# Patient Record
Sex: Male | Born: 1935 | Race: White | Hispanic: No | Marital: Married | Smoking: Former smoker
Health system: Southern US, Community
[De-identification: ages and names within clinical notes are randomized; demographics above are authoritative.]

## PROBLEM LIST (undated history)

## (undated) DIAGNOSIS — I4891 Unspecified atrial fibrillation: Secondary | ICD-10-CM

## (undated) DIAGNOSIS — N189 Chronic kidney disease, unspecified: Secondary | ICD-10-CM

## (undated) DIAGNOSIS — G4730 Sleep apnea, unspecified: Secondary | ICD-10-CM

## (undated) DIAGNOSIS — E785 Hyperlipidemia, unspecified: Secondary | ICD-10-CM

## (undated) DIAGNOSIS — I509 Heart failure, unspecified: Secondary | ICD-10-CM

## (undated) DIAGNOSIS — I340 Nonrheumatic mitral (valve) insufficiency: Secondary | ICD-10-CM

## (undated) DIAGNOSIS — C61 Malignant neoplasm of prostate: Secondary | ICD-10-CM

## (undated) DIAGNOSIS — N393 Stress incontinence (female) (male): Secondary | ICD-10-CM

## (undated) DIAGNOSIS — I10 Essential (primary) hypertension: Secondary | ICD-10-CM

## (undated) DIAGNOSIS — R350 Frequency of micturition: Secondary | ICD-10-CM

## (undated) DIAGNOSIS — N529 Male erectile dysfunction, unspecified: Secondary | ICD-10-CM

## (undated) DIAGNOSIS — M1990 Unspecified osteoarthritis, unspecified site: Secondary | ICD-10-CM

## (undated) DIAGNOSIS — I739 Peripheral vascular disease, unspecified: Secondary | ICD-10-CM

## (undated) DIAGNOSIS — E119 Type 2 diabetes mellitus without complications: Secondary | ICD-10-CM

## (undated) DIAGNOSIS — J189 Pneumonia, unspecified organism: Secondary | ICD-10-CM

## (undated) DIAGNOSIS — L97529 Non-pressure chronic ulcer of other part of left foot with unspecified severity: Secondary | ICD-10-CM

## (undated) DIAGNOSIS — E11621 Type 2 diabetes mellitus with foot ulcer: Secondary | ICD-10-CM

## (undated) HISTORY — PX: PROSTATE SURGERY: SHX751

## (undated) HISTORY — DX: Frequency of micturition: R35.0

## (undated) HISTORY — PX: EYE SURGERY: SHX253

## (undated) HISTORY — PX: CHOLECYSTECTOMY: SHX55

## (undated) HISTORY — DX: Stress incontinence (female) (male): N39.3

## (undated) HISTORY — DX: Male erectile dysfunction, unspecified: N52.9

## (undated) HISTORY — PX: TONSILLECTOMY: SUR1361

## (undated) HISTORY — DX: Malignant neoplasm of prostate: C61

---

## 2004-07-07 ENCOUNTER — Ambulatory Visit: Payer: Self-pay | Admitting: Internal Medicine

## 2006-01-20 ENCOUNTER — Ambulatory Visit: Payer: Self-pay | Admitting: Specialist

## 2006-07-18 ENCOUNTER — Ambulatory Visit: Payer: Self-pay | Admitting: Unknown Physician Specialty

## 2006-07-19 ENCOUNTER — Ambulatory Visit: Payer: Self-pay | Admitting: Cardiology

## 2006-07-28 ENCOUNTER — Ambulatory Visit: Payer: Self-pay

## 2006-09-07 ENCOUNTER — Ambulatory Visit: Payer: Self-pay | Admitting: Cardiology

## 2006-09-07 ENCOUNTER — Encounter (INDEPENDENT_AMBULATORY_CARE_PROVIDER_SITE_OTHER): Payer: Self-pay | Admitting: *Deleted

## 2006-09-07 ENCOUNTER — Inpatient Hospital Stay (HOSPITAL_COMMUNITY): Admission: RE | Admit: 2006-09-07 | Discharge: 2006-09-23 | Payer: Self-pay | Admitting: Urology

## 2006-09-15 ENCOUNTER — Encounter: Payer: Self-pay | Admitting: Cardiology

## 2006-09-15 ENCOUNTER — Ambulatory Visit: Payer: Self-pay | Admitting: Cardiology

## 2006-10-08 ENCOUNTER — Emergency Department (HOSPITAL_COMMUNITY): Admission: EM | Admit: 2006-10-08 | Discharge: 2006-10-08 | Payer: Self-pay | Admitting: Emergency Medicine

## 2008-12-24 ENCOUNTER — Ambulatory Visit: Payer: Self-pay | Admitting: Internal Medicine

## 2009-08-25 ENCOUNTER — Ambulatory Visit: Payer: Self-pay | Admitting: Family Medicine

## 2009-09-23 ENCOUNTER — Ambulatory Visit: Payer: Self-pay | Admitting: Family Medicine

## 2009-10-03 ENCOUNTER — Encounter: Admission: RE | Admit: 2009-10-03 | Discharge: 2009-10-03 | Payer: Self-pay | Admitting: Neurosurgery

## 2010-02-20 ENCOUNTER — Ambulatory Visit: Payer: Self-pay | Admitting: Ophthalmology

## 2010-03-04 ENCOUNTER — Ambulatory Visit: Payer: Self-pay | Admitting: Ophthalmology

## 2010-10-09 NOTE — Assessment & Plan Note (Signed)
[facility]                            CARDIOLOGY OFFICE NOTE   NAME:STIELPERHonorio, Devol                       MRN:          [id number]  DATE:07/19/2006                            DOB:          1935-01-11    Mr. Furtick is a very pleasant 76 year old male with a past medical  history of diabetes mellitus, hypertension, and hyperlipidemia who we  were asked to evaluate preoperatively prior to prostatectomy.  He has no  prior cardiac history.  He typically does not have dyspnea on exertion,  orthopnea, PND, pedal edema, palpitations, presyncope, syncope, or  exertional chest pain.  He has been found to have prostate cancer.  A  prostatectomy is potentially scheduled.  We were asked to evaluate prior  to the procedure.   His medications include:  1. Toprol 100 mg p.o. daily.  2. Metformin 1000 mg p.o. b.i.d.  3. Diovan 325 mg p.o. daily.  4. Lipitor 80 mg p.o. daily.  5. Actos 30 mg p.o. daily.  6. Januvia 100 mg p.o. daily.  7. Lotrel 10/40 daily.  8. Aspirin 81 mg p.o. daily.  9. CPAP.   HE HAS NO KNOWN DRUG ALLERGIES.   HE IS ALLERGIC TO LATEX.   SOCIAL HISTORY:  He has remote history of tobacco use, but does not  smoke at present.  He does not consume alcohol.   FAMILY HISTORY:  Negative for coronary artery disease.   PAST MEDICAL HISTORY:  Significant for:  1. Diabetes mellitus.  2. Hypertension.  3. Hyperlipidemia.  4. He also has obstructive sleep apnea, and uses CPAP.   He has been diagnosed with prostate cancer as outlined in the HPI.  He  has had a prior cholecystectomy as well as appendectomy.   REVIEW OF SYSTEMS:  He denies any headaches, fevers or chills.  There is  no productive cough or hemoptysis.  There is no dysphagia, odynophagia,  melena, or hematochezia.  There is no dysuria or hematuria.  There is no  rash or seizure activity.  There is no orthopnea, PND, or pedal edema.  There is no claudication noted.  He does have  mild peripheral  neuropathy.  The remainder of the systems are negative.   PHYSICAL EXAMINATION:  Shows a blood pressure of 180/80, and his pulse  is 60.  He weighs 229 pounds.  He is well developed and obese.  He is in acute distress.  SKIN:  Warm and dry.  He does appear to be depressed.  There is no peripheral clubbing.  BACK:  Normal.  HEENT:  Unremarkable with normal eyelids.  NECK:  Supple with normal upstroke bilaterally.  I could not appreciate  bruits.  There is no jugular venous distention.  No thyromegaly is  noted.  CHEST:  Clear to auscultation.  Normal expansion.  CARDIOVASCULAR:  Exam reveals a regular rate and rhythm.  Normal S1 and  S2.  There are no murmurs, rubs or gallops noted.  His PMI is  nondisplaced.  ABDOMINAL EXAM:  Not tender or distended.  Positive bowel sounds.  No  hepatosplenomegaly.  No masses appreciated.  There is no abdominal  bruit.  He has 2+ femoral pulses bilaterally.  No bruits.  EXTREMITIES:  Show no edema.  I could palpate no cords.  He has 1+  dorsalis pedis pulses bilaterally.  NEUROLOGIC EXAM:  Grossly intact.  His electrocardiogram shows a sinus rhythm at a rate of 60.  There are  nonspecific ST changes.   DIAGNOSES:  1. Preoperative evaluation prior to prostatectomy.  2. Prostate cancer.  3. Hypertension.  4. Hyperlipidemia.  5. Diabetes mellitus.   PLAN:  Mr. Santilli presents for preoperative evaluation prior to  prostatectomy.  He has multiple risk factors including approximately 8  to 10 years of diabetes mellitus, hypertension, hyperlipidemia, and  remote tobacco use.  We will schedule him to have an adenosine Myoview.  If this shows normal perfusion, then I think he can proceed safely with  his surgery.  I will see him back on an as-needed basis pending those  results.  We did discuss risk factor modification today, and he is  implementing an exercise program, which is another reason to perform his  Myoview.  His blood  pressure is mildly elevated today, but I have asked  him to track this, and he will follow up with his primary care physician  for this issue.  If it remains elevated, then a low-dose diuretic such  as hydrochlorothiazide would be helpful.     Madolyn Frieze Jens Som, MD, [facility]  Electronically Signed   BSC/MedQ  DD: 07/19/2006  DT: 07/19/2006  Job #: [id number]   cc:   Windy Fast L. Earlene Plater, M.D.

## 2010-10-09 NOTE — H&P (Signed)
[name], [name]              ACCOUNT NO.:  [id number]   MEDICAL RECORD NO.:  [id number]          PATIENT TYPE:  INP   LOCATION:  1434                         FACILITY:  [facility]   PHYSICIAN:  Ronald L. Earlene Plater, M.D.  DATE OF BIRTH:  [birth year]   DATE OF ADMISSION:  09/07/2006  DATE OF DISCHARGE:  09/23/2006                              HISTORY & PHYSICAL   CHIEF COMPLAINT:  I have prostate cancer.   HISTORY OF PRESENT ILLNESS:  [name] is a 76 year old white male  found have an elevated PSA of 17.8.  He underwent biopsy of the prostate  in [place], [place], was found have a Gleason score 6  adenocarcinoma in 5% of the biopsy of the left apex.  After  understanding risks, benefits and alternatives, he would like to proceed  with a robotic prostatectomy.   PAST MEDICAL HISTORY:  He is allergic to CONTRAST MEDIA; he had  anaphylaxis.   PAST MEDICAL HISTORY:  He has hypertension, hyperlipidemia, sleep apnea,  and type 2 diabetes.   PAST SURGERIES:  History of cholecystectomy and appendectomy.   FAMILY HISTORY:  Nonsignificant.   SOCIAL HISTORY:  He does not smoke or drink.   REVIEW OF SYSTEMS:  He has no shortness of breath, dyspnea on exertion,  chest pain or GI complaints.   PHYSICAL EXAMINATION:  VITAL SIGNS:  He is afebrile.  Vital signs  stable.  GENERAL:  Well-nourished, well-developed, well-groomed, oriented x3.  HEENT:  Normal.  NECK:  Without masses or thyromegaly.  CHEST:  Clear anterior and posterior without rales or rhonchi.  HEART:  Normal sinus rhythm without murmurs or gallops.  ABDOMEN:  Soft, nontender, without masses or organomegaly.  EXTREMITY:  Normal.  NEUROLOGIC:  Intact.  SKIN:  Normal.  GENITOURINARY:  Penis, meatus, scrotum, testicle, adnexa, anus, perineum  are normal.  Rectal vault is empty.  Prostate was 25-30 g, smooth and  dormant.   IMPRESSION:  Clinical stage T1c adenocarcinoma of the prostate.   PLAN:  Radical retropubic  prostatectomy.      Ronald L. Earlene Plater, M.D.  Electronically Signed     RLD/MEDQ  D:  11/15/2006  T:  11/16/2006  Job:  [id number]

## 2010-10-09 NOTE — Discharge Summary (Signed)
NAMERAMESES, OU              ACCOUNT NO.:  [id number]   MEDICAL RECORD NO.:  [id number]          PATIENT TYPE:  INP   LOCATION:  1434                         FACILITY:  [facility]   PHYSICIAN:  Ronald L. Earlene Plater, M.D.  DATE OF BIRTH:  Oct 26, 1934   DATE OF ADMISSION:  09/07/2006  DATE OF DISCHARGE:  09/23/2006                               DISCHARGE SUMMARY   ADMITTING DIAGNOSES:  1. Prostate cancer, planning for a robotic-assisted laparoscopic      prostatectomy.  2. Type 2 diabetes.  3. Sleep apnea.  4. Hyperlipidemia.   DISCHARGE DIAGNOSES:  1. Failed a Robotics prostatectomy; conversion to open radical      retropubic prostatectomy and placement of bilateral double-J      stents.  2. Acute tubular necrosis to ileus versus small bowel obstruction.  3. Diabetes.  4. REACTION TO CONTRAST MEDIUM after a computerized tomography scan.  5. Ventricular tachycardia.   BRIEF HISTORY:  [name] is a 76 year old Caucasian male found to  have an elevated PSA of 17.8.  He underwent biopsy of the prostate by  Dr. Rosezetta Schlatter in [place], [place], and was found to have  Gleason Score 6 adenocarcinoma in 5% of the biopsy from the left apex.  After understanding risks, benefits, and alternatives, the patient  elected to proceed with robotic medical prostatectomy.  He was also  worked up preoperatively for cardiac issues with his cardiac history and  was cleared.   PAST MEDICAL HISTORY:  1. Hypertension.  2. Hyperlipidemia.  3. Normal stress test on July 28, 2006.  4. Sleep apnea.  5. Type 2 diabetes.   PAST SURGICAL HISTORY:  1. Cholecystectomy 11 years ago.  2. Appendectomy 9 years ago.   ALLERGIES:  CONTRAST MEDIUM - ANAPHYLACTIC SHOCK.   HOSPITAL COURSE:  On April the 16th, patient was electively admitted to  [facility] under the care of Dr. Darvin Neighbours, again with the  following surgical procedure:  Attempted robotic radical prostatectomy  with conversion  to open radical prostatectomy and placement of double-J  stent.  Estimated blood loss during the procedure was 800 ml.  He was  extubated immediately after surgery.  Drains included a #20-French  Silicon Foley, and a large, round, Blake drain.   On postop day one, hypertensive throughout the evening despite IV  fluids, urinary output decreased, Blake output decreased, hemoglobin  9.8, hematocrit 28.1, WBCs 11.5.  Sodium 139, potassium 4.7, chloride  104, CO2 27, BUN 29, creatinine 2.15, glucose 144.  At this point,  Hospitalist was consulted.  Patient remained in the ICU.  Hospitalist  ordered transfusion of two units of RBCs.  Acute tubular necrosis was  suspected due to the hypertension.  Will monitor creatinine and  electrolytes.  Anemia secondary to acute blood loss subsequently  resolved with transfusion.  Due to his worsening renal function,  Metformin and Mingo Amber will be held and supplement with Lantus and  sliding scale, but Lipitor will be continued.  Postop day two, urine  output increased to 1000 ml, vital signs have remained stable, unable to  tolerate diet, decreased  back to liquid, Blake output continues to  decrease.  Labs:  Hemoglobin 9.9, hematocrit 30.1, sodium 129, potassium  3.7, chloride 99, CO2 23, glucose 135, BUN 40, creatinine 2.66.  Ambulation was encouraged, and IV fluids changed to normal saline.  [facility] continues to follow.  Renal function continues to  improve, hemoglobin continues to rise.  On September 10, 2006, Foley  catheter came out due to spontaneous rupture of balloon.  Dr. Brunilda Payor, on  call, and performed a flexible cysto and under direct visual of the  urethra was able to pass a flexible wire through the bladder neck.  A  #22-French council tip was then passed over the guidewire, and the  balloon was inflated with 20 ml of water.   The patient continues to tolerate liquids well, Blake drain continues  with minimal output, hemoglobin up to  10.6, hematocrit 30.5, BUN 23,  creatinine 1.09 on September 11, 2006.  Abdomen is soft and tender; however,  it is becoming distended.  Abdominal x-rays ordered.  A low grade fever  at 100.1 with a chest x-ray showing COPD and mild cardiomegaly.  Abdominal x-rays show ileus versus early small bowel obstruction.  An NG  tube was placed on September 11, 2006, and patient was NPO.  April 21st, JP  continues to have minimal to moderate drainage, creatinine obtained on  fluid was 1.1.  Creatinine now down to 1.1, hemoglobin 10.7, WBC 6.4.  Remains NPO, and NG remains in place.  A PICC line is recommended, and a  discussion of TPN.  Vital signs at this point remain stable.  The  patient still continues to complain of back pain, which is more stent  related.  Labs on April 22nd:  Sodium 136, potassium 4.1, chloride 102,  CO2 30, BUN 30, creatinine 1.0, calcium 7.55, white count 8, hemoglobin  9.6, hematocrit 20.7, platelets 263.  During this time, diabetes is  becoming more difficult to control and Lantus was increased, a PICC line  is added, and TPN is started.  [facility] continues to manage.   On April 23rd, small runs of V-tach noted, cardiologist consulted, PVCs  with nonsustained tachycardia most likely secondary to hyper-adrenergic  state associated with prostatectomy, ileus, and pain.  IV Lopressor was  increased to 5 mg IV q.6, and enzymes will be obtained.  On postop day  eight, April 24th, a temp was up to a max of 100.2, he remains on clear  liquids, no BM, no flatus, he remains with NG tube.  White count is up  to 15.8, hemoglobin is 8.8.  Attempted to clamp NG tube.   CT scan obtained for increased fever, rule out an abscess, however,  after getting IV contrast, patient felt flushed and was unable to  breathe.  Rapid Response Team was called to the scene, with Benadryl, Pepcid, Epinephrine, and Solu-Medrol given.  On April 25th, the  patient's NG tube discontinued, clears were  started, he was transferred  to the floor.  Patient continued to advance his diet, with flatus and  BM.  Patient's temp max on April 26th was 103.1, and this was when CT  scan was performed requiring management of anaphylaxis.   Patient continued to progress, IV antibiotics were stopped, BMs  continued, white count down to 14.8 with hemoglobin stable at 8.1.  Continues to have discomfort in back; however, the patient is reassured  that this is because of the stents.   The patient continues to improve, final blood  cultures are negative,  vital signs are stable with an occasional sinus brady.  Cardiology and  Hospitalist know he is set for discharge.  Therefore on May 02nd, the  patient is discharged home, his PICC line is discharged, the staples had  been removed previously, and he will follow up with Cardiology and  primary care [name].   CONDITION ON DISCHARGE:  Improved.   DISCHARGE MEDICATIONS:  1. Levaquin 500 mg one p.o. daily.  2. Oxycodone 5/325 one to two p.o. q.4h as needed.  3. Detrol-LA 4 mg one p.o. daily while catheter in.  4. Iron 325 mg one p.o. b.i.d.  5. Toprol-XL 100 mg daily.  6. Metformin 1000 mg b.i.d.  7. Diovan 325 mg daily.  8. Lipitor 80 mg daily.  9. Actos 30 mg daily.  10.Jenuvia 100 mg daily.  11.Lotrel 10-40 mg daily.  12.Aspirin 81 mg daily.  13.Colace 100 mg twice daily.   ACTIVITY:  No lifting for six weeks, increase activity slowly.   DIET:  Heart-healthy and diabetic.   WOUND CARE:  Keep Steri-Strips clean, may shower.   FOLLOWUP:  Return to see Cammy Copa the following week and  follow up with a cardiologist.     ______________________________  Alessandra Bevels. Chase Picket, FNP-C    ______________________________  Lucrezia Starch Earlene Plater, M.D.    JML/MEDQ  D:  10/18/2006  T:  10/18/2006  Job:  [id number]

## 2010-10-09 NOTE — Op Note (Signed)
[name], [name]              ACCOUNT NO.:  [id number]   MEDICAL RECORD NO.:  [id number]          PATIENT TYPE:  INP   LOCATION:  0003                         [facility]ACILITY[facility]  [facility]   PHYSICIAN:  Ronald L. Earlene Plater, M.D.  DATE OF BIRTH:  11-28-1934   DATE OF PROCEDURE:  09/07/2006  DATE OF DISCHARGE:                               OPERATIVE REPORT   PREOPERATIVE DIAGNOSES:  Adenocarcinoma of the prostate.   POSTOPERATIVE DIAGNOSES:  Adenocarcinoma of the prostate.   OPERATIVE PROCEDURE:  Attempted robotic radical prostatectomy with open  radical and retropubic prostatectomy and placement of bilateral double-J  stents.   SURGEON:  Gaynelle Arabian, M.D.   ASSISTANT:  Valetta Fuller, M.D.   ANESTHESIA:  General endotracheal.   BLOOD LOSS:  800 ML.   TUBES:  20-French silicone Foley and large round Blake drain.   INDICATIONS:  This is a very nice 76 year old white male who was found  to have an elevated PSA of 7.8.  He underwent biopsy of the prostate by  Dr. Dewayne Hatch in [facility] and was found to have  Gleason score 6 adenocarcinoma in 5% of the biopsy from the left apex.  After understanding risks, benefits and alternatives, being  preoperatively worked up for cardiac issues, it was elected to proceed  with robotic radical prostatectomy.   DESCRIPTION OF PROCEDURE:  The patient was placed in the supine  position.  After proper general endotracheal anesthesia was placed in  the dorsal lithotomy position, prepped and draped with Betadine in a  sterile fashion.  He was placed in exaggerated lithotomy position.  A 24-  French silicone catheter was placed and the bladder was drained.  The  periumbilical incision was made, a small incision.  The peritoneum was  entered and a 12-mm cannula was placed.  Inspection of the abdomen  revealed multiple adhesions in the right lower quadrant and some in the  right upper quadrant from his prior surgery.  The left-handed and  third  arm robotic ports were then placed under direct vision in the usual  manner and utilizing scissors and a Teaching laboratory technician the adhesions were  taken down so as to free up the peritoneal on the right side.  Under  direct vision the right robotic port was placed in the usual position as  was a 5 mm working port and the 12 mm right working port.  The robot was  attached and dissection was begun.  The bladder was filled with  approximately 150 mL of sterile water and the bladder flap was taken  down.  Of note he had a very tiny pelvis, a large prostate, significant  adipose tissue and motion in the robot was quite difficult.  The bladder  flap was taken down.  The bladder was deflated.  Endopelvic fascia was  perforated bilaterally.  After he had been defatted and the  puboprostatic ligaments were carefully dissected as was a dorsal vein  complex.  Utilizing Endo GIA stapler dorsal vein staple was placed.  There was some oozing therefore secondary stitch of figure-of-eight  Vicryl 2-0 was placed.  Following this dissection was done at the  bladder neck.  The bladder neck was taken down posteriorly.  It was  extremely difficult.  He had quite a large prostate and was noted to  have a very large median bar.  This was taken down.  It was very close  to the ureteral orifices.  Indigo carmine was given IV and they were  visualized but when the dissection was carried down posterior to the  prostate, although good hemostasis, it was very deep, very limited  motion of the robotic arms and it was felt at this point that it was  dangerous and felt that the procedure should be opened at this point.  We were just not comfortable proceeding with the difficult motion of the  arms.  We were utilizing a 30 degree lens at that point.  The hardware  was subsequently removed.  A lower midline vertical abdominal incision  was made.  Sharp dissection was carried down.  The peritoneum was  opened.  The  prostate was identified.  The lateral pedicles were  identified and the apex was taken down cut, a plane was dissected on  Denonvilliers fascia.  Each pedicle was taken in packet and clipped with  Hem-o-lok clips.  The seminal vesicles and ampulla of vas deferens were  dissected free and removed.  Good hemostasis noted to  be present.  The  bladder neck was noted to be widely opened.  The ureteral orifices were  noted.  Therefore, bilateral 26 cm 6-French contour double pigtail  stents were placed and noted to be in good position within the right and  left renal pelves.  The bladder neck was then closed in a tennis racket  type fashion from the top to bottom with 2-0 Vicryl suture.  The  ureteral orifices and stents were ducted into the bladder neck.  Next, a  sound was placed in the urethra.  The urethra was identified and a total  of four 2-0 Vicryl sutures were placed with double arm UR5 needles in  the 5 o'clock, 7 o'clock, 2 o'clock and 10 o'clock positions and placed  appropriately on the bladder neck.  A 20-French 10 mL Foley catheter was  passed.  The sutures were tied down.  The bladder was noted to irrigate  clear.  A large round Blake drain was placed through the left robotic  arm port.  It was felt that the right 12-mm port was baffled enough and  it was felt that it was not necessary to close the fascial defect.  Wounds were all irrigated.  The fascia was closed in the midline with a  running #1 PDS.  Again irrigation was performed.  All skin wounds were  closed with skin staples and the patient was taken to the recovery room  stable.      Ronald L. Earlene Plater, M.D.  Electronically Signed     RLD/MEDQ  D:  09/07/2006  T:  09/07/2006  Job:  [id number]

## 2010-10-09 NOTE — Consult Note (Signed)
[name], [name]              ACCOUNT NO.:  [id number]   MEDICAL RECORD NO.:  [id number]          PATIENT TYPE:  INP   LOCATION:  1228                         FACILITY:  [facility]   PHYSICIAN:  Madolyn Frieze. Jens Som, MD, FACCDATE OF BIRTH:  [birth year]   DATE OF CONSULTATION:  09/14/2006  DATE OF DISCHARGE:                                 CONSULTATION   [name] is a pleasant 76 year old male with a past medical history  of diabetes mellitus, hypertension, hyperlipidemia and now status post  prostatectomy (complicated by transient renal insufficiency and ileus)  who we are asked to evaluate for PVCs and nonsustained ventricular  tachycardia.  I did see the patient on July 19, 2006 in the office  for preoperative evaluation.  Note at that time he had a Myoview  performed which revealed an ejection fraction of 60% and normal  perfusion.  He had his prostatectomy performed on September 07, 2006.  His  postoperative course has been complicated by hypotension that is now  resolved.  There was also a transient increase in his BUN and creatinine  with a peak of 420 and 2.66.  He also has developed an ileus.  He has  been noted to have nonsustained ventricular tachycardia and PVCs also on  his telemetry and electrocardiogram.  Cardiology is now asked to further  evaluate.  Note, he denies any chest pain, shortness of breath,  palpitations or presyncope.   MEDICATIONS AT PRESENT:  1. Aspirin 81 mg via tube daily.  2. Reglan.  3. Lopressor 2.5 mg IV q.6h.  4. Protonix 40 mg IV daily.  5. Ciprofloxacin 400 mg IV q.12h.   ALLERGIES:  He has NO KNOWN DRUG ALLERGIES.  HE IS ALLERGIC TO LATEX.   SOCIAL HISTORY:  He has a remote history tobacco use but he does not  smoke anymore.  He does not consume alcohol.   FAMILY HISTORY:  Negative for coronary artery disease.   PAST MEDICAL HISTORY:  1. Diabetes mellitus.  2. Hypertension.  3. Hyperlipidemia.  4. He also has obstructive sleep apnea  and typically uses CPAP.  5. He also has prostate cancer and is now status post prostatectomy as      outlined in the HPI.  6. He has also had a cholecystectomy and appendectomy.   REVIEW OF SYSTEMS:  He denies any headaches or fevers or chills.  There  is no productive cough or hemoptysis.  He is presently n.p.o. due to his  ileus and an NG is in place.  He is not passing gas or having bowel  movements.  He has tenderness from his recent surgery.  Remaining  systems are negative.   PHYSICAL EXAMINATION:  Blood pressure 179/67.  His heart rate is 97.  He  is afebrile.  He is well-developed and somewhat obese.  He does not  appear to be in any acute distress at this time.  His skin is warm and  dry.  He does not appear to be depressed.  There is no peripheral  clubbing.  HEENT:  Normal with normal eyelids.  NECK:  Supple with  a normal upstroke bilaterally and I cannot appreciate  bruits.  There is no jugular distension and no thyromegaly is noted.  CHEST:  Clear to auscultation with normal expansion anteriorly.  CARDIOVASCULAR:  Reveals a regular rate and rhythm with a normal S1-S2.  There is soft 1/6 systolic ejection murmur at the left sternal border.  There is no S3 or S4.  I cannot palpate his PMI today.  ABDOMEN:  Shows distension and hypoactive bowel sounds.  I cannot  appreciate hepatosplenomegaly.  He is status post prostatectomy and a  lower abdominal dressing is in place.  He has 2+ femoral pulses  bilaterally.  No bruits.  EXTREMITIES:  Show no edema that I can palpate.  No cords.  He has 1+  dorsalis pedis pulses bilaterally.  NEUROLOGIC:  Grossly intact.   LABORATORY DATA:  Today show a white blood cell count of 11.9 with a  hemoglobin of 9.3 and hematocrit 26.7.  Platelet count is 271.  His  sodium is 139 with potassium of 4.3.  BUN and creatinine are 19 and  0.85.  his magnesium is 1.8.  Albumin is low at 1.8.  Urine cultures  show no growth.  Chest x-ray showed  bibasilar atelectasis.  His  electrocardiogram shows normal sinus rhythm with frequent PVCs and  nonsustained ventricular tachycardia.  This is also noted on his  telemetry.  Note there are nonspecific ST changes.   DIAGNOSES:  1. PVCs/nonsustained tachycardia - the patient's increased ectopy is      most likely secondary to the hyperadrenergic state associated with      his recent prostatectomy, ileus and pain.  He is not taking p.o.      medications at this point.  I will increase his IV Lopressor to 5      mg IV q.6h.  I would resume his preoperative p.o. medications after      his ileus resolves and he is taking p.o.  I would follow and      supplement electrolytes as needed.  We will cycle enzymes although      I do not think he is ischemic.  Of note he did have a normal      Myoview on July 28, 2006 and his LV function was normal as well.  2. Diabetes mellitus - per primary care.  3. Hypertension.  His blood pressure is elevated and the increased      Lopressor would help with  this.  I would recommend resuming his      p.o. medications as described above when his ileus resolves.  4. Prostate cancer as per urology.  5. Hyperlipidemia - I would resume his statin when he is tolerating      p.o. intake.   We will be happy to follow while he is in the hospital.      Madolyn Frieze. Jens Som, MD, [facility]  Electronically Signed     BSC/MEDQ  D:  09/14/2006  T:  09/14/2006  Job:  [id number]

## 2010-10-09 NOTE — Consult Note (Signed)
[name], [name]              ACCOUNT NO.:  [id number]   MEDICAL RECORD NO.:  [id number]          PATIENT TYPE:  INP   LOCATION:  1228                         FACILITY:  [facility]   PHYSICIAN:  Theone Stanley, MD   DATE OF BIRTH:  1935/02/28   DATE OF CONSULTATION:  09/08/2006  DATE OF DISCHARGE:                                 CONSULTATION   REFERRING PHYSICIAN:  Ronald L. Earlene Plater, M.D.   REASON FOR CONSULTATION:  Hypotension, diabetes, acute renal failure.   HISTORY OF PRESENT ILLNESS:  [name] is a very pleasant 76 year old  gentleman who was recently diagnosed with prostate cancer and was  scheduled for a robotic prostatectomy.  Per patient and family member,  apparently his pelvis was narrow, and there was a question of narrowing  secondary to arthritis.  Because of this, there were issues with doing  the prostatectomy robotically; therefore, they had to convert to open.  Patient states that he has had problems with BPH for many years.  He has  had trouble urinating and does not have a strong stream.  He has been on  Flomax and actually had a TUNA procedure.  This helped for a bit, but he  continues to have problems.  His only other complaint currently is when  he stands, he does get dizzy.  When asked about his diabetes, he says  just average blood sugars are about 150.   PAST MEDICAL HISTORY:  1. Diabetes.  2. Hypertension.  3. Hyperlipidemia.  4. Obstructive sleep apnea.   MEDICATIONS:  1. Metoprolol 100 mg 1 p.o. b.i.d.  2. Metformin 1000 b.i.d.  3. Diovan 325 daily.  4. Lipitor 800 daily.  5. Actos 30 daily.  6. Jenuvia 100 p.o. daily.  7. Lotrel 10/40 daily.  8. Aspirin 81 mg daily.   ALLERGIES:  LATEX.  Apparently, the patient gets a rash.   SURGERIES:  Patient had a TUNA, appendectomy, cholecystectomy, and  recent prostatectomy.   FAMILY HISTORY:  Significant for stroke and diabetes.  Brother died of  lymphoma.   SOCIAL HISTORY:  Patient lives in  [place].  He is married.  He has two  children.  No alcohol, tobacco, or illicit drug use.   REVIEW OF SYSTEMS:  Please see HPI, otherwise all 10 review of systems  were negative.   PHYSICAL EXAMINATION:  GENERAL:  [name] is a very pleasant 71-year-  old gentleman sitting in the chair in no acute distress.  VITAL SIGNS:  Current blood pressure 89/57.  Apparently, he had gone all  the way down into the 80s.  Pulse ox of 97.  Respiratory rate 15.  Heart  rate 57.  HEENT:  Head is normocephalic and atraumatic.  Eyes 3 mm, equal and  reactive.  Extraocular movements are intact.  Ears:  No discharge.  Throat:  Clear.  No erythema.  No exudate.  Mucous membranes are moist.  NECK:  Supple.  No lymphadenopathy.  No JVD.  HEART:  Regular rate and rhythm.  No rubs or gallops are heard.  LUNGS:  Clear to auscultation bilaterally.  ABDOMEN:  Soft and nontender along the incision, lower abdomen.  EXTREMITIES:  No clubbing, cyanosis or edema.  NEURO:  Nonfocal.  GU:  Deferred.  PSYCH:  Patient had appropriate speech and affect.  SKIN:  No rashes or jaundice appreciated.   LABS:  Patient's prior labs to his surgery, he had a hemoglobin of 14,  creatinine of 0.74.  Currently, white count at 11, hemoglobin 9.5,  hematocrit 27, platelets 220.  Sodium 139, potassium 4.7, chloride 104,  CO2 27, BUN 29, creatinine 2.1.  Glucose at 144.   ASSESSMENT/PLAN/RECOMMENDATIONS:  1. Hypotension:  This is multifactorial, including acute blood loss,      intra-abdominal third spacing, and medications.  I would hold all      blood pressure medications and nephrotoxic medications.  He has      lost several pints of blood, according to his labs.  I would      transfuse him 2 units of RBCs, if this is okay with urology.  2. Acute renal failure:  I suspect this is acute tubular necrosis      secondary to hypertension.  The goal will be to increase his blood      pressure greater than 100 systolic, hold all  nephrotoxic      medications, including NSAIDs, ARBs, ACEs.  This will be slow to      recover.  If the patient's creatinine continues to rise or he has      electrolyte abnormalities, I would consult nephrology at that point      in time.  3. Anemia:  This is anemia secondary to acute blood loss.  I recommend      transfusing at this point in time, especially since the patient has      multiple risk factors, since he has never had issues with coronary      artery disease, I suspect that he does have one and would be a      little more liberal with transfusing blood with this patient.  4. Diabetes:  Would hold the Metformin secondary to his creatinine;      however, I would hold his [place] and      supplement with Lantus and insulin sliding scale when necessary.  5. Hyperlipidemia:  Would continue on his Lipitor.   Thank you very much for this consultation.      Theone Stanley, MD  Electronically Signed     AEJ/MEDQ  D:  09/08/2006  T:  09/08/2006  Job:  [id number]   cc:   Barry Brunner  Fax: [phone]   Lucrezia Starch. Earlene Plater, M.D.  Fax: [phone]

## 2011-01-12 ENCOUNTER — Ambulatory Visit: Payer: Self-pay | Admitting: Internal Medicine

## 2011-02-23 ENCOUNTER — Ambulatory Visit: Payer: Self-pay | Admitting: Ophthalmology

## 2011-09-20 ENCOUNTER — Ambulatory Visit: Payer: Self-pay | Admitting: Unknown Physician Specialty

## 2011-09-22 LAB — PATHOLOGY REPORT

## 2013-01-19 ENCOUNTER — Inpatient Hospital Stay: Payer: Self-pay | Admitting: Internal Medicine

## 2013-01-19 LAB — BASIC METABOLIC PANEL
Anion Gap: 10 (ref 7–16)
Co2: 22 mmol/L (ref 21–32)
Creatinine: 0.96 mg/dL (ref 0.60–1.30)
EGFR (Non-African Amer.): >60
Glucose: 193 mg/dL — ABNORMAL HIGH (ref 65–99)
Osmolality: 282 (ref 275–301)
Potassium: 3.6 mmol/L (ref 3.5–5.1)

## 2013-01-19 LAB — HEPATIC FUNCTION PANEL A (ARMC)
Alkaline Phosphatase: 140 U/L — ABNORMAL HIGH (ref 50–136)
Bilirubin, Direct: 0.1 mg/dL (ref 0.00–0.20)
Bilirubin,Total: 0.8 mg/dL (ref 0.2–1.0)
SGPT (ALT): 26 U/L (ref 12–78)
Total Protein: 7.5 g/dL (ref 6.4–8.2)

## 2013-01-19 LAB — CBC
MCH: 29.1 pg (ref 26.0–34.0)
RDW: 14.3 % (ref 11.5–14.5)
WBC: 14 10*3/uL — ABNORMAL HIGH (ref 3.8–10.6)

## 2013-01-19 LAB — TROPONIN I: Troponin-I: <0.02 ng/mL

## 2013-01-19 LAB — APTT: Activated PTT: 36.7 secs — ABNORMAL HIGH (ref 23.6–35.9)

## 2013-01-20 LAB — CBC WITH DIFFERENTIAL/PLATELET
Eosinophil %: 6.6 %
HCT: 47.9 % (ref 40.0–52.0)
Lymphocyte #: 1.6 10*3/uL (ref 1.0–3.6)
Lymphocyte %: 12.6 %
MCH: 28.8 pg (ref 26.0–34.0)
Monocyte #: 1 x10 3/mm (ref 0.2–1.0)
Monocyte %: 8.3 %
Neutrophil #: 9 10*3/uL — ABNORMAL HIGH (ref 1.4–6.5)
Neutrophil %: 71.4 %
Platelet: 245 10*3/uL (ref 150–440)
RDW: 14.5 % (ref 11.5–14.5)

## 2013-01-20 LAB — BASIC METABOLIC PANEL
BUN: 22 mg/dL — ABNORMAL HIGH (ref 7–18)
Chloride: 103 mmol/L (ref 98–107)
Co2: 30 mmol/L (ref 21–32)
EGFR (Non-African Amer.): >60
Sodium: 140 mmol/L (ref 136–145)

## 2013-01-20 LAB — TROPONIN I: Troponin-I: <0.02 ng/mL

## 2013-01-20 LAB — PROTIME-INR: INR: 2.3

## 2013-01-20 LAB — CK: CK, Total: 62 U/L (ref 35–232)

## 2013-11-13 ENCOUNTER — Ambulatory Visit: Payer: Self-pay | Admitting: Ophthalmology

## 2014-09-13 NOTE — Discharge Summary (Signed)
PATIENT NAME:  [name], [name] MR#:  [id number] DATE OF BIRTH:  08-16-1934  DATE OF ADMISSION:  01/19/2013 DATE OF DISCHARGE:  01/20/2013  PRIMARY CARE PHYSICIAN:  Dr. Ilene Qua.   CONSULTATIONS:  Cardiology, Dr. Saralyn Pilar.   DISCHARGE DIAGNOSES: 1.  New-onset atrial fibrillation with rapid ventricular rate.  2.  Acute diastolic congestive heart failure.  3.  Hypertension.  4.  Diabetes.  5.  Hyperlipidemia.   CONDITION:  Stable.   CODE STATUS:  FULL CODE.   HOME MEDICATIONS:   1.  Atorvastatin 80 mg by mouth at bedtime.  2.  Diovan HCT 25 mg/325 mg by mouth daily.  3.  Hydralazine 50 mg by mouth twice daily.  4.  Lopressor 100 mg by mouth twice daily.  5.  Aspirin 81 mg by mouth daily.  6.  Metformin 1000 mg by mouth twice daily.  7.  Bydureon 2 mg subQ powder for injection subQ once a week.  8.  Actos 15 mg by mouth daily.  9.  Coumadin 5 mg by mouth daily.  10.  Cardizem 240 mg by mouth capsule one cap once a day.  11.  Lasix 40 mg by mouth daily.   DIET:  Low sodium, low fat, low cholesterol, ADA diet.   ACTIVITY:  As tolerated.   FOLLOW-UP CARE:  Follow with PCP within 1 to 2 weeks.  Follow up with Dr. Nehemiah Massed within one week.   REASON FOR ADMISSION:  Palpitations, shortness of breath and lightheadedness.   HOSPITAL COURSE:  The patient is a 80 year old Caucasian male with a history of A-Fib on Coumadin diagnosed recently and malignant hypertension, hyperlipidemia, diabetes, presented in the ED with palpitation, shortness of breath and lightheadedness.  The patient was found to have A-Fib with RVR at 130 to 140s.  He received two doses of IV Cardizem without improving so the patient was started with a Cardizem drip and admitted to CCU.  In addition, the patient has an elevated BNP.  He was treated with Lasix IV 40 mg.  His INR is 2.2.  For a detailed history and physical examination, please refer to the admission note dictated by Dr. Waldron Labs.  Laboratory data on  admission date showed glucose 193, BNP 2286, BUN 26, creatinine 0.96.  Troponin less than 0.02.  WBC 14, hemoglobin 15.1.  Troponin less than 0.02.  EKG showed A-Fib with RVR at 145 bpm.  Chest x-ray showed vascular congestion and pulmonary edema.  1.  A-Fib with RVR.  After admission, the patient was treated with a Cardizem drip.  Heart rate was under control,  pt was weaned of Cardizem drip and started by mouth diltiazem.  Also continue Coumadin.  The patient's last INR is 2.3 today.  The patient's heart rate is very well-controlled.  Dr. Saralyn Pilar evaluated the patient, suggested continue Cardizem 240 mg by mouth daily and a beta-blocker and continue Coumadin.  The patient had an echo recently which showed ejection fraction of 60%.  2.  Acute CHF, diastolic dysfunction.  The patient has been treated with Lasix 40 mg IV every four hours for three doses.  His symptoms have much improved.  He will be treated with Lasix 40 mg by mouth daily and he needs to continue aspirin, statin, beta-blockers, ACE inhibitor  3.  Hypertension.  The patient has been treated with hypertension medication since Cardizem was added.  We discontinued Norvasc with ACE inhibitor, but continued Diovan. 4.  Hyperlipidemia.  The patient has been treated with a statin.  5. Diabetes.  Blood sugar is controlled under sliding scale.    The patient's symptoms has much improved.  The patient's vital signs are stable.  Physical examination is unremarkable.  Dr. Saralyn Pilar evaluated the patient today and agreed with discharge the patient to home today.  I discussed the patient's discharge plan with the patient, case manager and nurse.   TIME SPENT:  About 35 minutes.    ____________________________ [name], MD qc:ea D: 01/20/2013 15:26:31 ET T: 01/21/2013 01:40:19 ET JOB#: [id number]  cc: [name], MD, <Dictator> [name] MD ELECTRONICALLY SIGNED 01/21/2013 14:46

## 2014-09-13 NOTE — H&P (Signed)
PATIENT NAME:  [name], [name] MR#:  [id number] DATE OF BIRTH:  1935-02-14  DATE OF ADMISSION:  01/19/2013  PRIMARY CARE PHYSICIAN: Dr. Ilene Qua   REFERRING PHYSICIAN:  Dr. Marta Antu  PRIMARY CARDIOLOGIST: Dr. Nehemiah Massed.   CHIEF COMPLAINTS: Palpitations, shortness of breath, lightheadedness.   HISTORY OF PRESENT ILLNESS: This is a 80 year old male with known past medical history of atrial fibrillation on warfarin, malignant hypertension, hyperlipidemia, diabetes mellitus. The patient is followed by cardiology with Dr. Nehemiah Massed, the patient presents with above-mentioned complaints. The patient is known to have history of obstructive sleep apnea, on CPAP at night.  He went to the bathroom and on the way, he felt very short of breath, lightheaded which prompted him to go back to bed and ask his wife to call EMS. The patient denies any chest pain, but his main complaint was shortness of breath and lightheadedness and palpitations. In the ED, the patient was found to be in AFib with RVR with heart rate in the 130s to 140s. He received 2 doses of IV Cardizem 15 mg x 2 with minimal improvement of his heart rate, but it just jumped back again so he is going to be started on Cardizem drip.  As well, the patient was noticed to have lower extremity edema. The patient denies having history of edema in the past, as well his chest x-ray did show some vascular congestion. As well, he had elevated BNP. The patient received 40 of IV Lasix where he reports much improvement of his shortness of breath after that. The patient's INR was 2.2. First troponin was negative. Hospitalist service was requested to admit the patient for further management and work-up of his atrial fibrillation with RVR and what appears to be new diagnosis of CHF.   ALLERGIES:  1. DYE  2. LATEX.   HOME MEDICATIONS:  1.  Amlodipine/benazepril 10-40 mg p.o. daily.  2.  Atorvastatin 80 mg oral daily.  3.  Diovan/hydrochlorothiazide 320/25 mg  p.o. daily.  4.  Hydralazine    oral 2 times a day.  5.  Metoprolol succinate extended release 100 mg oral p.o. b.i.d.  6.  Aspirin 81 mg oral daily.  8.  Metformin 1000 mg oral twice daily.  9.  Bydureon 1viral subcutaneous weekly.  10.  Pioglitazone 15 mg oral daily.  11.  Warfarin 5 mg oral daily.   PAST MEDICAL HISTORY: 1. Malignant hypertension.  2.  Obstructive sleep apnea, on CPAP. 3.  Atrial fibrillation.  4.  Diabetes mellitus.  5.  Hyperlipidemia.   FAMILY HISTORY: Brother had early onset of CVA. Mother has history of hypertension.   SOCIAL HISTORY: The patient smoked for 30 years, quit 2002. No alcohol. No illicit drug use.   REVIEW OF SYSTEMS:  GENERAL:  Denies fever or chills. Complains of fatigue, weakness. Denies weight gain, weight loss.  EYES: Denies blurry vision, double vision, inflammation, glaucoma.  ENT: Denies tinnitus, ear pain, epistaxis or discharge.  RESPIRATORY: Denies cough, wheezing, hemoptysis, COPD, painful respiratory. Reports dyspnea. CARDIOVASCULAR: Denies chest pain, orthopnea. Has edema and palpitations. Denies syncope.  GASTROINTESTINAL: Denies nausea, vomiting, diarrhea, abdominal pain, hematemesis, melena, jaundice.  GENITOURINARY: Denies dysuria, hematuria or renal colic.  ENDOCRINE: Denies polyuria, polydipsia, heat or cold intolerance.  HEMATOLOGY: Denies anemia, easy bruising, bleeding diathesis.  INTEGUMENTARY: Denies acne, rash or skin lesions.  MUSCULOSKELETAL: Denies any gout, arthritis or cramps.  NEUROLOGIC:  Denies CVA, TIA, ataxia, dementia, headache.  PSYCHIATRIC: Denies anxiety, insomnia, schizophrenia, nervousness.   PHYSICAL EXAMINATION:  VITAL SIGNS: Temperature 98.2, pulse 123, respiratory rate 22, blood pressure 152/77, saturating 96% on oxygen.  GENERAL: Well-nourished male who looks comfortable in no apparent distress.  HEENT: Head atraumatic, normocephalic. Pupils equal and reactive to light. Pink conjunctivae.  Anicteric sclerae. Moist oral mucosa.  NECK: Supple. No thyromegaly. No JVD.  CHEST: Good air entry bilaterally. No wheezing, rales, rhonchi. Has bibasilar crackles.  CARDIOVASCULAR: S1, S2 heard. No rubs, murmurs gallops.  Irregularly irregular, tachycardic.  ABDOMEN: Soft, nontender, nondistended. Bowel sounds present.  EXTREMITIES: +1 edema bilaterally. No clubbing. No cyanosis. Dorsalis pedis pulse +2 bilaterally, radial pulses +2 bilaterally.  PSYCHIATRIC: Appropriate affect. Awake, alert x3. Intact judgment and insight.  NEUROLOGIC: Cranial nerves gross intact.  Motor 5 out of 5. No focal deficits.  LYMPHATICS: No cervical or supraclavicular lymphadenopathy.   PERTINENT LABORATORY DATA: Glucose 193, BNP 2286, BUN 26, creatinine 0.96, sodium 136, potassium 3.6, chloride 104, CO2 22, alkaline phosphatase 140, AST 21, ALT 26. Troponin less than 0.02, CK-MB 1.2, total CK 84. White blood cells 14, hemoglobin 15.1, hematocrit 44, platelets 219. INR 2.2.   EKG showing atrial fibrillation with RVR at 145 beats per minute.   Chest x-ray showing vascular congestion and awaiting official reading by Radiology.   ASSESSMENT AND PLAN: 1.  Atrial fibrillation with rapid ventricular rate. The patient is known to have a history of atrial fibrillation, currently uncontrolled, despite receiving IV Cardizem x2. Still in rapid ventricular rate, so he will be started on Cardizem drip.  He is on anticoagulation with therapeutic INR 2.2. Will consult pharmacy to continue dosing his warfarin. We will resume him back on his beta blockers. We will repeat his echo. We will continue to cycle his cardiac enzymes. We will consult cardiology to see this and  adjustments needed in his meds.  2.  Congestive heart failure. Appears to be new onset. The patient's last echo showing ejection fraction of 60%  with no evidance of  congestive heart failure. The patient will be started on diuresis 40 mg IV every 4 hours. Will order a  total of 3 doses then re-evaluate prior to continuing his dose. He is already on aspirin, statin, beta blockers, ACE inhibitor and ARB as well.  3.  Hypertension, mildly elevated. We will resume the patient back on his home medications, but we will cut his hydralazine as he is on Cardizem drip and he is on diuresis and would like to avoid causing him to have hypotension with this.  4.  Hyperlipidemia. Continue with statin.  5.  Diabetes mellitus. We will hold all oral medications. Have him on insulin sliding scale.  6.  Sleep apnea. We will have him on CPAP.  7.  Deep vein thrombosis prophylaxis. The patient is on full anticoagulation with warfarin.  8.  Gastrointestinal prophylaxis: The patient is on proton pump inhibitor.    9.  CODE STATUS: FULL CODE.   Total time spent on admission and patient care: 60 minutes.    ____________________________ Albertine Patricia, MD dse:dp D: 01/19/2013 08:08:03 ET T: 01/19/2013 09:02:02 ET JOB#: [id number]  cc: Albertine Patricia, MD, <Dictator> Lorey Pallett Graciela Husbands MD ELECTRONICALLY SIGNED 01/20/2013 7:12

## 2014-09-13 NOTE — Consult Note (Signed)
PATIENT NAME:  [name], [name] MR#:  [id number] DATE OF BIRTH:  [birth year]  DATE OF CONSULTATION:  01/19/2013  REFERRING PHYSICIAN:   CONSULTING PHYSICIAN:  Isaias Cowman, MD  PRIMARY CARE PHYSICIAN:  Dr. Ilene Qua.  CARDIOLOGIST:  Dr. Nehemiah Massed.   CHIEF COMPLAINT:  Shortness of breath.   HISTORY OF PRESENT ILLNESS:  The patient is a 80 year old gentleman with recent diagnosis of atrial fibrillation on chronic warfarin therapy, a history of malignant hypertension, diabetes as well as obstructive sleep apnea on CPAP. On 01/18/2013, the patient awoke from bed, he felt short of breath associated with palpitations and presented to [facility] Emergency Room. In the ED, the patient was noted to be in atrial fibrillation with a rapid ventricular rate, treated with two doses of intravenous Cardizem and admitted to the CCU. Admission labs were notable for a negative troponin of less than 0.02. Today, the patient remains in atrial fibrillation with a control ventricular rate. The patient denies chest pain.   PAST MEDICAL HISTORY:  1.  A history of malignant hypertension.  2.  Obstructive sleep apnea.  3.  Atrial fibrillation.  4.  Diabetes.  5.  Hyperlipidemia.   MEDICATIONS: 1.  Amlodipine/benazepril 10/40 one daily. 2.  Atorvastatin 80 mg daily.  3.  Diovan/hydrochlorothiazide 320/25 mg daily.  4.  Hydralazine 25 mg b.i.d. 5.  Metoprolol succinate 100 mg b.i.d. 6.  Aspirin 81 mg daily. 7.  Metformin 1000 mg b.i.d.  8.  P.o. glipizide 15 mg daily.  9.  Warfarin 5 mg daily.   SOCIAL HISTORY:  The patient is married, resides with his wife, has a 30 pack-year tobacco abuse history, quit 2002.   FAMILY HISTORY:  No immediate family history for coronary artery disease or myocardial infarction.   REVIEW OF SYSTEMS:  CONSTITUTIONAL:  No fever or chills.  EYES:  No blurry vision.  EARS:  No hearing loss.  RESPIRATORY:  Shortness of breath as described above.  GASTROINTESTINAL:  No nausea,  vomiting, diarrhea, or constipation.  GENITOURINARY:  No dysuria or hematuria.  ENDOCRINE:  No polyuria or polydipsia.  MUSCULOSKELETAL:  No arthralgias or myalgias.  NEUROLOGICAL:  No focal muscle weakness or numbness.  PSYCHOLOGICAL:  No depression or anxiety.   PHYSICAL EXAMINATION:  VITAL SIGNS:  Blood pressure 128/81, pulse 76, respirations 25, temperature 98.2, pulse ox 98%.  HEENT:  Pupils equal, reactive to light and accommodation.  NECK:  Supple without thyromegaly.  LUNGS:  Decreased breath sounds in both bases.  HEART:  Normal JVP. Normal PMI. Irregular, irregular rhythm. Normal S1, S2. No appreciable gallop, murmur, or rub.  ABDOMEN:  Soft and nontender.  EXTREMITIES:  Trace pedal edema.  MUSCULOSKELETAL:  Normal muscle tone.  NEUROLOGIC:  The patient is alert and oriented x 3. Motor and sensory are both grossly intact.   IMPRESSION:  A 80 year old gentleman with a history of malignant hypertension, obstructive sleep apnea, probable chronic diastolic congestive heart failure with recent diagnosis of atrial fibrillation who presents with atrial fibrillation with a rapid ventricular rate, now with controlled rate. The patient has ruled out for myocardial infarction by CPK, isoenzymes and troponin.   RECOMMENDATIONS:  1.  Agree with overall current therapy.  2.  Agree with diltiazem drip converting to p.o. diltiazem.  3.  Would discontinue amlodipine/benazepril combination drug since the patient would be receiving two calcium channel blockers, an ACE inhibitor and angiotensin receptor blocker.  4.  Would up titrate to p.o. Cardizem according to heart rate.  5.  Review 2-D  echocardiogram.  6.  Transferred to telemetry once off of Cardizem drip.  ____________________________ Isaias Cowman, MD ap:jm D: 01/19/2013 16:37:37 ET T: 01/19/2013 17:09:54 ET JOB#: [id number]  cc: Isaias Cowman, MD, <Dictator> Isaias Cowman MD ELECTRONICALLY SIGNED 02/01/2013 7:59

## 2014-09-13 NOTE — Consult Note (Signed)
PATIENT NAME:  [name], [name] MR#:  [id number] DATE OF BIRTH:  Nov 25, 1934  DATE OF CONSULTATION:  01/20/2013  CONSULTING PHYSICIAN:  Isaias Cowman, MD  PRIMARY CARE PHYSICIAN: Valetta Close, MD  CHIEF COMPLAINT: Fever.   HISTORY OF PRESENT ILLNESS: The patient is a 80 year old male with history of Crohn's disease, currently a resident at [facility], who was admitted with 2-week history of nausea, vomiting, diarrhea. The patient was brought to [facility] Emergency Room, where she was noted to be hypotensive with systolic blood pressures in the 70s, requiring intravenous fluid resuscitation. In the Emergency Room, the patient was noted to have urinary tract infection and was admitted with diagnosis of urosepsis. Admission labs were notable for BUN and creatinine of 51 and 1.19, respectively. Troponin of 0.67. The patient denies chest pain. EKG did not show any acute ischemic ST-T wave changes.   PAST MEDICAL HISTORY:  1. Crohn's.  2. Type 2 diabetes.  3. Anxiety disorder.  4. Hypertension.   MEDICATIONS ON ADMISSION:  1. Quinapril 40 mg daily.  2. Azathioprine 200 mg daily.  3. Dicyclomine 10 mg b.i.d. 4. Hydrochlorothiazide 25 mg daily.  5. Imodium 1 tablet p.r.n.  6. Lomotil 2 tablets b.i.d.  7. Mag-Ox 400 mg b.i.d. 8. Metformin 500 mg b.i.d. 9. Methotrexate injection 25 mg weekly.  10. Metoprolol 100 mg b.i.d.  11. Multivitamin 2 tablets daily.  12. Norco 325/5 q.4-6 hours p.r.n. 13. Novolin insulin 15 units subcutaneous q.a.m.  14. NovoLog 5 units b.i.d. 15. Prilosec 20 mg daily.  16. Calcium 500 mg b.i.d. 17. Sertraline 100 mg b.i.d. 18. Tylenol 500 mg 2 tabs b.i.d. p.r.n.  19. Zyrtec 10 mg daily.   SOCIAL HISTORY: The patient currently is a resident at [facility]. She has a remote tobacco abuse history.   FAMILY HISTORY: Positive for coronary artery disease in her father.  REVIEW OF SYSTEMS:  CONSTITUTIONAL: The patient has mild fever and chills.   EYES: No blurry vision.  EARS: No hearing loss.  RESPIRATORY: No shortness of breath.  CARDIOVASCULAR: No chest pain.  GASTROINTESTINAL: The patient has nausea, vomiting and diarrhea.  GENITOURINARY: No dysuria or hematuria.  ENDOCRINE: No polyuria or polydipsia.  MUSCULOSKELETAL: No arthralgias or myalgias.  NEUROLOGICAL: No focal muscle weakness or numbness.  PSYCHOLOGICAL: No depression or anxiety.   PHYSICAL EXAMINATION:  VITAL SIGNS: Blood pressure 92/38, pulse 116, respirations 20, temperature 97.6, pulse oximetry 95%.  HEENT: Pupils equally reactive to light and accommodation.  NECK: Supple without thyromegaly.  LUNGS: Clear.  CARDIOVASCULAR: Normal JVP. Normal PMI. Regular rate and rhythm. Normal S1, S2. No appreciable gallop, murmur or rub.  ABDOMEN: Soft and nontender.  PULSES: Intact bilaterally.  MUSCULOSKELETAL: Normal muscle tone.  NEUROLOGIC: The patient is alert and oriented x3. Motor and sensory both grossly intact.   IMPRESSION: A 80 year old male with Crohn's disease, insulin-requiring diabetes, currently a resident at [facility], who presents with a 2-week history of nausea, vomiting, diarrhea, dehydration and probable urosepsis, with borderline elevated troponin in the absence of chest pain. Elevated troponin likely due to demand-supply ischemia and not due to acute coronary syndrome.   RECOMMENDATIONS:  1. Agree with current therapy.  2. Would defer full-dose anticoagulation.  3. Review 2-D echocardiogram.  4. Further recommendations pending echocardiogram results.   ____________________________ Isaias Cowman, MD ap:OSi D: 01/20/2013 10:24:31 ET T: 01/20/2013 11:14:36 ET JOB#: [id number]  cc: Isaias Cowman, MD, <Dictator> Isaias Cowman MD ELECTRONICALLY SIGNED 02/01/2013 7:59

## 2014-11-05 ENCOUNTER — Ambulatory Visit: Payer: Self-pay | Admitting: Urology

## 2014-12-05 ENCOUNTER — Ambulatory Visit: Payer: Self-pay | Admitting: Urology

## 2014-12-23 ENCOUNTER — Telehealth: Payer: Self-pay | Admitting: Urology

## 2014-12-23 ENCOUNTER — Other Ambulatory Visit: Payer: Self-pay | Admitting: *Deleted

## 2014-12-23 ENCOUNTER — Ambulatory Visit (INDEPENDENT_AMBULATORY_CARE_PROVIDER_SITE_OTHER): Payer: Medicare Other | Admitting: Urology

## 2014-12-23 ENCOUNTER — Encounter: Payer: Self-pay | Admitting: *Deleted

## 2014-12-23 VITALS — BP 136/91 | HR 94 | Ht 67.0 in | Wt 219.6 lb

## 2014-12-23 DIAGNOSIS — N400 Enlarged prostate without lower urinary tract symptoms: Secondary | ICD-10-CM

## 2014-12-23 DIAGNOSIS — C61 Malignant neoplasm of prostate: Secondary | ICD-10-CM | POA: Diagnosis not present

## 2014-12-23 DIAGNOSIS — Z8546 Personal history of malignant neoplasm of prostate: Secondary | ICD-10-CM

## 2014-12-23 DIAGNOSIS — R32 Unspecified urinary incontinence: Secondary | ICD-10-CM

## 2014-12-23 LAB — BLADDER SCAN AMB NON-IMAGING: Scan Result: 23

## 2014-12-23 NOTE — Progress Notes (Signed)
12/23/2014 9:42 AM   [name] 18-Mar-1935 [id number]  Referring provider: No referring provider defined for this encounter.  Chief Complaint  Patient presents with  . Prostate Cancer    6 month follow up  . Erectile Dysfunction    HPI: [name] is a 80 year old white male with a history of Gleason's 6 adenocarcinoma of the prostate who underwent a radical prostatectomy approximately 5 years by Dr. Rosana [name] in [place]. His PSA's have been undetectable since that time.  His iPSA was 7.4 ng/mL.     His last PSA was <0.1 ng/mL on 05/09/2014.   He denies any weight loss, bone pain or appetite loss.   He has been experiencing incontinence since his prostatectomy. He wears depends daily. He has been tried on anticholinergics in the past without effectiveness. It is a stress urinary incontinence. He loses urine when he laughs, strains or rises to a standing position.      IPSS      12/23/14 0900       International Prostate Symptom Score   How often have you had the sensation of not emptying your bladder? Less than 1 in 5     How often have you had to urinate less than every two hours? Less than 1 in 5 times     How often have you found you stopped and started again several times when you urinated? Less than half the time     How often have you found it difficult to postpone urination? Less than half the time     How often have you had a weak urinary stream? Not at All     How often have you had to strain to start urination? Not at All     How many times did you typically get up at night to urinate? 1 Time     Total IPSS Score 7     Quality of Life due to urinary symptoms   If you were to spend the rest of your life with your urinary condition just the way it is now how would you feel about that? Unhappy        Score:  1-7 Mild 8-19 Moderate 20-35 Severe   He also has been experiencing erectile dysfunction since his prostatectomy. Today his SH IM score is  5, which is severe ED. He has been unresponsive to PDE 5 inhibitors. He is not interested in pursuing intra cavernosal injections, urethral pelvis or vacuum erect aid devices.  He states he is unable to achieve or maintain an erection. He also states that he is no longer experiencing spontaneous erections since his prostatectomy.      SHIM      12/23/14 0922       SHIM: Over the last 6 months:   How do you rate your confidence that you could get and keep an erection? Very Low     When you had erections with sexual stimulation, how often were your erections hard enough for penetration (entering your partner)? Almost Never or Never     During sexual intercourse, how often were you able to maintain your erection after you had penetrated (entered) your partner? Extremely Difficult     During sexual intercourse, how difficult was it to maintain your erection to completion of intercourse? Extremely Difficult     When you attempted sexual intercourse, how often was it satisfactory for you? Extremely Difficult     SHIM Total Score   SHIM  5        Score: 1-7 Severe ED 8-11 Moderate ED 12-16 Mild-Moderate ED 17-21 Mild ED 22-25 No ED  PMH: Past Medical History  Diagnosis Date  . Frequent urination   . ED (erectile dysfunction)   . Urinary stress incontinence, male   . Elevated blood pressure   . Prostate cancer   . Carcinoma of prostate     Surgical History: Past Surgical History  Procedure Laterality Date  . Prostate surgery      removal    Home Medications:    Medication List       This list is accurate as of: 12/23/14  9:42 AM.  Always use your most recent med list.               amLODipine-benazepril 5-20 MG per capsule  Commonly known as:  LOTREL     atorvastatin 80 MG tablet  Commonly known as:  LIPITOR  Take 80 mg by mouth daily.     canagliflozin 300 MG Tabs tablet  Commonly known as:  INVOKANA  Take 300 mg by mouth daily before breakfast.     clobetasol  ointment 0.05 %  Commonly known as:  TEMOVATE     diltiazem 240 MG 24 hr capsule  Commonly known as:  DILACOR XR  Take by mouth.     Exenatide ER 2 MG Pen  Inject into the skin once a week.     furosemide 20 MG tablet  Commonly known as:  LASIX     metFORMIN 1000 MG tablet  Commonly known as:  GLUCOPHAGE  Take 1,000 mg by mouth 2 (two) times daily with a meal.     metoprolol succinate 100 MG 24 hr tablet  Commonly known as:  TOPROL-XL  Take 100 mg by mouth daily. Take with or immediately following a meal.     ONE TOUCH ULTRA 2 W/DEVICE Kit  Use as directed.     ONE TOUCH ULTRA TEST test strip  Generic drug:  glucose blood  Use 2 (two) times daily. Use as instructed.     ONETOUCH DELICA LANCETS FINE Misc  Use 1 Device 2 (two) times daily.     pioglitazone 30 MG tablet  Commonly known as:  ACTOS  Take 30 mg by mouth daily.     TRULICITY 1.5 QJ/1.9ER Sopn  Generic drug:  Dulaglutide     valsartan 160 MG tablet  Commonly known as:  DIOVAN  Take 160 mg by mouth daily.     warfarin 5 MG tablet  Commonly known as:  COUMADIN  Take 5 mg by mouth daily.        Allergies:  Allergies  Allergen Reactions  . Contrast Media [Iodinated Diagnostic Agents]     SOB  . Iohexol      Desc: Respiratory Distress, Laryngedema, diaphoresis, Onset Date: [id number]   . Latex Rash    Family History: Family History  Problem Relation Age of Onset  . Prostate cancer Neg Hx   . Bladder Cancer Neg Hx   . Kidney disease Neg Hx     Social History:  reports that he quit smoking about 20 years ago. He does not have any smokeless tobacco history on file. He reports that he does not drink alcohol or use illicit drugs.  ROS: UROLOGY Frequent Urination?: No Hard to postpone urination?: No Burning/pain with urination?: No Get up at night to urinate?: No Leakage of urine?: Yes Urine stream starts and stops?: Yes Trouble  starting stream?: No Do you have to strain to urinate?:  No Blood in urine?: No Urinary tract infection?: No Sexually transmitted disease?: No Injury to kidneys or bladder?: No Painful intercourse?: No Weak stream?: No Erection problems?: Yes Penile pain?: No  Gastrointestinal Nausea?: No Vomiting?: No Indigestion/heartburn?: No Diarrhea?: No Constipation?: No  Constitutional Fever: No Night sweats?: No Weight loss?: No Fatigue?: No  Skin Skin rash/lesions?: No Itching?: No  Eyes Blurred vision?: No Double vision?: No  Ears/Nose/Throat Sore throat?: No Sinus problems?: No  Hematologic/Lymphatic Swollen glands?: No Easy bruising?: No  Cardiovascular Leg swelling?: No Chest pain?: No  Respiratory Cough?: No Shortness of breath?: No  Endocrine Excessive thirst?: No  Musculoskeletal Back pain?: No Joint pain?: No  Neurological Headaches?: No Dizziness?: No  Psychologic Depression?: No Anxiety?: No  Physical Exam: BP 136/91 mmHg  Pulse 94  Ht '5\' 7"'  (1.702 m)  Wt 219 lb 9.6 oz (99.61 kg)  BMI 34.39 kg/m2  GU: Patient with circumcised phallus.   Urethral meatus is patent.  No penile discharge. No penile lesions or rashes. Scrotum without lesions, cysts, rashes and/or edema.  Testicles are located scrotally bilaterally. No masses are appreciated in the testicles. Left and right epididymis are normal. Rectal: Patient with  normal sphincter tone. Perineum without scarring or rashes. No rectal masses are appreciated. Prostate and seminal vesicles are surgically absent.     Laboratory Data: Results for orders placed or performed in visit on 12/23/14  BLADDER SCAN AMB NON-IMAGING  Result Value Ref Range   Scan Result 23    Lab Results  Component Value Date   WBC 12.6* 01/20/2013   HGB 16.3 01/20/2013   HCT 47.9 01/20/2013   MCV 84 01/20/2013   PLT 245 01/20/2013    Lab Results  Component Value Date   CREATININE 1.09 01/20/2013    No results found for: PSA  No results found for:  TESTOSTERONE  No results found for: HGBA1C  Urinalysis No results found for: COLORURINE, APPEARANCEUR, LABSPEC, PHURINE, GLUCOSEU, HGBUR, BILIRUBINUR, KETONESUR, PROTEINUR, UROBILINOGEN, NITRITE, LEUKOCYTESUR  Pertinent Imaging:   Assessment & Plan:    1. Prostate cancer:   Patient underwent a radical prostatectomy proximately 5 years ago by Dr. Rosana [name] in [place]. His Gleason score was 6 at the time of his biopsy.  His PSA's have been undetectable since that time.   I do not have the pathology from the radical prostatectomy available to me at this visit.  I will request this information.  We have drawn a PSA today.  He will continue to follow-up on a six-month basis with PSA's and DRE's. Patient is quite anxious concerning his history of prostate cancer.  2. Erectile dysfunction:   Patient has experienced erectile dysfunction since he underwent his radical prostatectomy. His SHIM score is 5. He has been unresponsive to PDE 5 inhibitors. He does not wish to pursue urethral pellets, intra cavernosal injections or the vacuum erect aid device. At his last visit, we discussed penile prosthesis placement.  He is still undecided about this procedure.  3. Incontinence:   Patient has experienced incontinence since he underwent his radical prostatectomy. His IPSS score is 7/5. His PVR is 23 mL. He has tried anticholinergics in the past without success. He wears depends daily. At his last visit, we discussed the urethral sling procedure. He is still undecided about this procedure.  There are no diagnoses linked to this encounter.  Return in about 6 months (around 06/25/2015) for IPSS and SHIM and  PVR.  Zara Council, [facility] [address] [place], [place] [address] [phone]

## 2014-12-23 NOTE — Telephone Encounter (Signed)
Would you call [facility] in [place] to get his records concerning his prostatectomy?

## 2014-12-24 ENCOUNTER — Telehealth: Payer: Self-pay

## 2014-12-24 LAB — PSA: Prostate Specific Ag, Serum: <0.1 ng/mL (ref 0.0–4.0)

## 2014-12-24 NOTE — Telephone Encounter (Signed)
-----   Message from Nori Riis, PA-C sent at 12/24/2014  8:38 AM EDT ----- Patient's PSA is stable.  We will see him in 6 months.  PSA to be drawn before his next appointment.

## 2014-12-24 NOTE — Telephone Encounter (Signed)
Spoke with pt in reference to PSA and [facility] records. Pt will stop by today to sign medical release forms. Pt voiced understanding. Orders for PSA in 75mo were placed.

## 2015-06-17 ENCOUNTER — Other Ambulatory Visit: Payer: Medicare Other

## 2015-06-18 ENCOUNTER — Telehealth: Payer: Self-pay | Admitting: Urology

## 2015-06-18 NOTE — Telephone Encounter (Signed)
Pt called to confirm his appt for 2/1.  He had a lab appt for 1/24 scheduled to have his PSA drawn but did not make that appt.  Pt did not want to RS his lab appt and requested that he have his labs drawn the same day he has his appt with Larene [facility].  Pt does not want to make two trips to the office.  Pt is going to keep his 2/1 appt and has requested to have his labs drawn on that day of the visit.

## 2015-06-25 ENCOUNTER — Ambulatory Visit (INDEPENDENT_AMBULATORY_CARE_PROVIDER_SITE_OTHER): Payer: Medicare Other | Admitting: Urology

## 2015-06-25 ENCOUNTER — Encounter: Payer: Self-pay | Admitting: Urology

## 2015-06-25 VITALS — BP 157/88 | HR 86 | Ht 67.0 in | Wt 222.6 lb

## 2015-06-25 DIAGNOSIS — N5231 Erectile dysfunction following radical prostatectomy: Secondary | ICD-10-CM

## 2015-06-25 DIAGNOSIS — Z8546 Personal history of malignant neoplasm of prostate: Secondary | ICD-10-CM

## 2015-06-25 DIAGNOSIS — R32 Unspecified urinary incontinence: Secondary | ICD-10-CM

## 2015-06-25 NOTE — Progress Notes (Signed)
8:50 AM   [name] 01-20-1935 [id number]  Referring provider: Sherrin Daisy, MD [place], [place] [address]  Chief Complaint  Patient presents with  . Prostate Cancer    6 month follow up  . Urinary Incontinence    HPI: [name] is a 81 year old white male with a history of Gleason's 6 adenocarcinoma of the prostate who underwent a radical prostatectomy approximately 5 years by Dr. Rosana Hoes in [place]. His PSA's have been undetectable since that time.  His iPSA was 7.4 ng/mL.     His last PSA was <0.1 ng/mL on 12/23/2014.   He denies any weight loss, bone pain or appetite loss.   He has been experiencing incontinence since his prostatectomy. He wears depends daily. He has been tried on anticholinergics in the past without effectiveness. It is a stress urinary incontinence. He loses urine when he laughs, strains or rises to a standing position.      IPSS      06/25/15 0800       International Prostate Symptom Score   How often have you had the sensation of not emptying your bladder? About half the time     How often have you had to urinate less than every two hours? Less than half the time     How often have you found you stopped and started again several times when you urinated? Not at All     How often have you found it difficult to postpone urination? About half the time     How often have you had a weak urinary stream? Less than 1 in 5 times     How often have you had to strain to start urination? Not at All     How many times did you typically get up at night to urinate? 1 Time     Total IPSS Score 10     Quality of Life due to urinary symptoms   If you were to spend the rest of your life with your urinary condition just the way it is now how would you feel about that? Mostly Disatisfied        Score:  1-7 Mild 8-19 Moderate 20-35 Severe   He also has been experiencing erectile dysfunction since his prostatectomy. Today his SHIM  score is 5, which is severe ED. He has been unresponsive to PDE 5 inhibitors. He is not interested in pursuing intra cavernosal injections, urethral pelvis or vacuum erect aid devices.  He states he is unable to achieve or maintain an erection. He also states that he is no longer experiencing spontaneous erections since his prostatectomy.      SHIM      06/25/15 0836       SHIM: Over the last 6 months:   How do you rate your confidence that you could get and keep an erection? Very Low     When you had erections with sexual stimulation, how often were your erections hard enough for penetration (entering your partner)? Almost Never or Never     During sexual intercourse, how often were you able to maintain your erection after you had penetrated (entered) your partner? Extremely Difficult     During sexual intercourse, how difficult was it to maintain your erection to completion of intercourse? Extremely Difficult     When you attempted sexual intercourse, how often was it satisfactory for you? Extremely Difficult     SHIM Total Score  SHIM 5        Score: 1-7 Severe ED 8-11 Moderate ED 12-16 Mild-Moderate ED 17-21 Mild ED 22-25 No ED  PMH: Past Medical History  Diagnosis Date  . Frequent urination   . ED (erectile dysfunction)   . Urinary stress incontinence, male   . Elevated blood pressure   . Prostate cancer ([place])   . Carcinoma of prostate [facility])     Surgical History: Past Surgical History  Procedure Laterality Date  . Prostate surgery      removal    Home Medications:    Medication List       This list is accurate as of: 06/25/15  8:50 AM.  Always use your most recent med list.               amLODipine-benazepril 5-20 MG capsule  Commonly known as:  LOTREL     atorvastatin 80 MG tablet  Commonly known as:  LIPITOR  Take 80 mg by mouth daily.     canagliflozin 300 MG Tabs tablet  Commonly known as:  INVOKANA  Take 300 mg by mouth daily before breakfast.       clobetasol ointment 0.05 %  Commonly known as:  TEMOVATE     diltiazem 240 MG 24 hr capsule  Commonly known as:  CARDIZEM CD  Take 240 mg by mouth daily.     diltiazem 240 MG 24 hr capsule  Commonly known as:  DILACOR XR  Take by mouth. Reported on 06/25/2015     Exenatide ER 2 MG Pen  Inject into the skin once a week. Reported on 06/25/2015     furosemide 20 MG tablet  Commonly known as:  LASIX     metFORMIN 1000 MG tablet  Commonly known as:  GLUCOPHAGE  Take 1,000 mg by mouth 2 (two) times daily with a meal.     metoprolol succinate 100 MG 24 hr tablet  Commonly known as:  TOPROL-XL  Take 100 mg by mouth daily. Take with or immediately following a meal.     ONE TOUCH ULTRA 2 w/Device Kit  Use as directed.     ONE TOUCH ULTRA TEST test strip  Generic drug:  glucose blood  Use 2 (two) times daily. Use as instructed.     ONETOUCH DELICA LANCETS FINE Misc  Use 1 Device 2 (two) times daily.     pioglitazone 30 MG tablet  Commonly known as:  ACTOS  Take 30 mg by mouth daily.     TRULICITY 1.5 ON/6.2XB Sopn  Generic drug:  Dulaglutide     valsartan 320 MG tablet  Commonly known as:  DIOVAN     warfarin 5 MG tablet  Commonly known as:  COUMADIN  Take 5 mg by mouth daily.        Allergies:  Allergies  Allergen Reactions  . Contrast Media [Iodinated Diagnostic Agents]     SOB  . Iohexol      Desc: Respiratory Distress, Laryngedema, diaphoresis, Onset Date: [id number]   . Latex Rash    Family History: Family History  Problem Relation Age of Onset  . Prostate cancer Neg Hx   . Bladder Cancer Neg Hx   . Kidney disease Neg Hx     Social History:  reports that he quit smoking about 20 years ago. He does not have any smokeless tobacco history on file. He reports that he does not drink alcohol or use illicit drugs.  ROS: UROLOGY Frequent Urination?: No Hard  to postpone urination?: No Burning/pain with urination?: No Get up at night to urinate?:  No Leakage of urine?: No Urine stream starts and stops?: No Trouble starting stream?: No Do you have to strain to urinate?: No Blood in urine?: No Urinary tract infection?: No Sexually transmitted disease?: No Injury to kidneys or bladder?: No Painful intercourse?: No Weak stream?: No Erection problems?: No Penile pain?: No  Gastrointestinal Nausea?: No Vomiting?: No Indigestion/heartburn?: No Diarrhea?: No Constipation?: No  Constitutional Fever: No Night sweats?: No Weight loss?: No Fatigue?: No  Skin Skin rash/lesions?: No Itching?: No  Eyes Blurred vision?: No Double vision?: No  Ears/Nose/Throat Sore throat?: No Sinus problems?: No  Hematologic/Lymphatic Swollen glands?: No Easy bruising?: No  Cardiovascular Leg swelling?: No Chest pain?: No  Respiratory Cough?: No Shortness of breath?: No  Endocrine Excessive thirst?: No  Musculoskeletal Back pain?: No Joint pain?: No  Neurological Headaches?: No Dizziness?: No  Psychologic Depression?: No Anxiety?: No  Physical Exam: BP 157/88 mmHg  Pulse 86  Ht '5\' 7"'  (1.702 m)  Wt 222 lb 9.6 oz (100.971 kg)  BMI 34.86 kg/m2  GU: Patient with circumcised phallus.   Urethral meatus is patent.  No penile discharge. No penile lesions or rashes. Scrotum without lesions, cysts, rashes and/or edema.  Testicles are located scrotally bilaterally. No masses are appreciated in the testicles. Left and right epididymis are normal. Rectal: Patient with  normal sphincter tone. Perineum without scarring or rashes. No rectal masses are appreciated. Prostate and seminal vesicles are surgically absent.     Laboratory Data:  Results for orders placed or performed in visit on 12/23/14  PSA  Result Value Ref Range   Prostate Specific Ag, Serum <0.1 0.0 - 4.0 ng/mL    Lab Results  Component Value Date   WBC 12.6* 01/20/2013   HGB 16.3 01/20/2013   HCT 47.9 01/20/2013   MCV 84 01/20/2013   PLT 245 01/20/2013     Lab Results  Component Value Date   CREATININE 1.09 01/20/2013    Assessment & Plan:    1. History of prostate cancer:   Patient underwent a radical prostatectomy proximately 5 years ago by Dr. Rosana Hoes in [place]. His Gleason score was 6 at the time of his biopsy.  His PSA's have been undetectable since that time.   I do not have the pathology from the radical prostatectomy available to me at this visit.  I will request this information.  His last PSA was <0.1 ng/mL on 12/23/2014.  We have drawn a PSA today.  He will continue to follow-up on a six-month basis with PSA's and DRE's. Patient is quite anxious concerning his history of prostate cancer.  2. Erectile dysfunction:   Patient has experienced erectile dysfunction since he underwent his radical prostatectomy. His SHIM score is 5. He has been unresponsive to PDE 5 inhibitors. He does not wish to pursue urethral pellets, intra cavernosal injections or the vacuum erect aid device. At his last visit, we discussed penile prosthesis placement.  He is still undecided about this procedure.  3. Incontinence:   Patient has experienced incontinence since he underwent his radical prostatectomy. His IPSS score is 10/4.   He has tried anticholinergics in the past without success. He wears depends daily. At his last visit, we discussed the urethral sling procedure. He is still undecided about this procedure.   Return in about 6 months (around 12/23/2015) for IPSS score, SHIM score and exam.  Zara Council, PA-C  [facility]  [address] [place], [place] [address] [phone]

## 2015-06-26 ENCOUNTER — Telehealth: Payer: Self-pay

## 2015-06-26 DIAGNOSIS — R972 Elevated prostate specific antigen [PSA]: Secondary | ICD-10-CM

## 2015-06-26 LAB — PSA: Prostate Specific Ag, Serum: <0.1 ng/mL (ref 0.0–4.0)

## 2015-06-26 NOTE — Telephone Encounter (Signed)
-----   Message from Nori Riis, PA-C sent at 06/26/2015  8:43 AM EST ----- Patient's PSA is undetectable.  We will see him in 6 months.  PSA to be drawn before his next appointment.

## 2015-12-23 ENCOUNTER — Encounter: Payer: Self-pay | Admitting: Urology

## 2015-12-23 ENCOUNTER — Ambulatory Visit (INDEPENDENT_AMBULATORY_CARE_PROVIDER_SITE_OTHER): Payer: Medicare Other | Admitting: Urology

## 2015-12-23 VITALS — BP 150/87 | HR 92 | Ht 67.0 in | Wt 215.1 lb

## 2015-12-23 DIAGNOSIS — C61 Malignant neoplasm of prostate: Secondary | ICD-10-CM | POA: Diagnosis not present

## 2015-12-23 DIAGNOSIS — N528 Other male erectile dysfunction: Secondary | ICD-10-CM

## 2015-12-23 DIAGNOSIS — R32 Unspecified urinary incontinence: Secondary | ICD-10-CM

## 2015-12-23 DIAGNOSIS — N529 Male erectile dysfunction, unspecified: Secondary | ICD-10-CM

## 2015-12-23 NOTE — Progress Notes (Signed)
8:[place] [birth year] [id number]  Referring provider: Sherrin Daisy, MD [place], [place] [address]  Chief Complaint  Patient presents with  . Prostate Cancer    6 month follow up  . Erectile Dysfunction    HPI: [name] is a 81 year old  Caucasian  male with a history of Gleason's 6 adenocarcinoma of the prostate who underwent a radical prostatectomy approximately 6 years by Dr. Rosana Hoes in [place].   His PSA's have been undetectable since that time.  His iPSA was 7.4 ng/mL.     His last PSA was <0.1 ng/mL on 06/25/2015.    He denies any weight loss, bone pain or appetite loss.  IPSS is 5/1.    He has been experiencing incontinence since his prostatectomy. He wears depends daily. He has been tried on anticholinergics in the past without effectiveness. It is a stress urinary incontinence. He loses urine when he laughs, strains or rises to a standing position.      IPSS    Row Name 12/23/15 0800         International Prostate Symptom Score   How often have you had the sensation of not emptying your bladder? Less than 1 in 5     How often have you had to urinate less than every two hours? Less than half the time     How often have you found you stopped and started again several times when you urinated? Less than 1 in 5 times     How often have you found it difficult to postpone urination? Not at All     How often have you had a weak urinary stream? Not at All     How often have you had to strain to start urination? Not at All     How many times did you typically get up at night to urinate? 1 Time     Total IPSS Score 5       Quality of Life due to urinary symptoms   If you were to spend the rest of your life with your urinary condition just the way it is now how would you feel about that? Pleased        Score:  1-7 Mild 8-19 Moderate 20-35 Severe   He also has been experiencing erectile dysfunction since his  prostatectomy. Today his SHIM score is less than 5, which is severe ED.  His previous SHIM is 5 .  He has been unresponsive to PDE 5 inhibitors. He is not interested in pursuing intra cavernosal injections, urethral pelvis or vacuum erect aid devices.  He states he is unable to achieve or maintain an erection. He also states that he is no longer experiencing spontaneous erections since his prostatectomy.      SHIM    Row Name 12/23/15 0850         SHIM: Over the last 6 months:   How do you rate your confidence that you could get and keep an erection? Very Low     When you had erections with sexual stimulation, how often were your erections hard enough for penetration (entering your partner)? A Few Times (much less than half the time)     During sexual intercourse, how often were you able to maintain your erection after you had penetrated (entered) your partner? Did not attempt intercourse     During sexual intercourse, how difficult was it to maintain your erection to  completion of intercourse? Did not attempt intercourse     When you attempted sexual intercourse, how often was it satisfactory for you? Did not attempt intercourse       SHIM Total Score   SHIM 3        Score: 1-7 Severe ED 8-11 Moderate ED 12-16 Mild-Moderate ED 17-21 Mild ED 22-25 No ED  PMH: Past Medical History:  Diagnosis Date  . Carcinoma of prostate ([place])   . ED (erectile dysfunction)   . Elevated blood pressure   . Frequent urination   . Prostate cancer ([place])   . Urinary stress incontinence, male     Surgical History: Past Surgical History:  Procedure Laterality Date  . PROSTATE SURGERY     removal    Home Medications:    Medication List       Accurate as of 12/23/15  8:57 AM. Always use your most recent med list.          amLODipine-benazepril 5-20 MG capsule Commonly known as:  LOTREL   atorvastatin 80 MG tablet Commonly known as:  LIPITOR Take 80 mg by mouth daily.   canagliflozin  300 MG Tabs tablet Commonly known as:  INVOKANA Take 300 mg by mouth daily before breakfast.   clobetasol ointment 0.05 % Commonly known as:  TEMOVATE   diltiazem 240 MG 24 hr capsule Commonly known as:  CARDIZEM CD Take 240 mg by mouth daily.   CARTIA XT 180 MG 24 hr capsule Generic drug:  diltiazem   Exenatide ER 2 MG Pen Inject into the skin once a week. Reported on 06/25/2015   furosemide 20 MG tablet Commonly known as:  LASIX TAKE 1 TABLET DAILY   metFORMIN 1000 MG tablet Commonly known as:  GLUCOPHAGE TAKE 1 TABLET TWICE A DAY WITH MEALS   metoprolol succinate 100 MG 24 hr tablet Commonly known as:  TOPROL-XL Take 100 mg by mouth daily. Take with or immediately following a meal.   ONE TOUCH ULTRA 2 w/Device Kit Use as directed.   FIFTY50 GLUCOSE METER 2.0 w/Device Kit Use as directed.   ONE TOUCH ULTRA TEST test strip Generic drug:  glucose blood Use 2 (two) times daily. Use as instructed.   ONETOUCH DELICA LANCETS FINE Misc Use 1 Device 2 (two) times daily.   Lancets 30G Misc Use 1 Device 2 (two) times daily.   pioglitazone 30 MG tablet Commonly known as:  ACTOS Take 30 mg by mouth daily.   TRULICITY 1.5 YP/9.5KD Sopn Generic drug:  Dulaglutide   valsartan 320 MG tablet Commonly known as:  DIOVAN   valsartan 320 MG tablet Commonly known as:  DIOVAN TAKE 1 TABLET DAILY   warfarin 5 MG tablet Commonly known as:  COUMADIN Take 5 mg by mouth daily.       Allergies:  Allergies  Allergen Reactions  . Contrast Media [Iodinated Diagnostic Agents]     SOB  . Iohexol      Desc: Respiratory Distress, Laryngedema, diaphoresis, Onset Date: [id number]   . Latex Rash    Family History: Family History  Problem Relation Age of Onset  . Prostate cancer Neg Hx   . Bladder Cancer Neg Hx   . Kidney disease Neg Hx     Social History:  reports that he quit smoking about 21 years ago. He has never used smokeless tobacco. He reports that he does not  drink alcohol or use drugs.  ROS: UROLOGY Frequent Urination?: No Hard to postpone urination?: No Burning/pain  with urination?: No Get up at night to urinate?: No Leakage of urine?: Yes Urine stream starts and stops?: No Trouble starting stream?: No Do you have to strain to urinate?: No Blood in urine?: No Urinary tract infection?: No Sexually transmitted disease?: No Injury to kidneys or bladder?: No Painful intercourse?: No Weak stream?: No Erection problems?: No Penile pain?: No  Gastrointestinal Nausea?: No Vomiting?: No Indigestion/heartburn?: No Diarrhea?: No Constipation?: No  Constitutional Fever: No Night sweats?: No Weight loss?: No Fatigue?: No  Skin Skin rash/lesions?: No Itching?: No  Eyes Blurred vision?: No Double vision?: No  Ears/Nose/Throat Sore throat?: No Sinus problems?: No  Hematologic/Lymphatic Swollen glands?: No Easy bruising?: No  Cardiovascular Leg swelling?: No Chest pain?: No  Respiratory Cough?: No Shortness of breath?: No  Endocrine Excessive thirst?: No  Musculoskeletal Back pain?: No Joint pain?: No  Neurological Headaches?: No Dizziness?: No  Psychologic Depression?: No Anxiety?: No  Physical Exam: BP (!) 150/87   Pulse 92   Ht '5\' 7"'  (1.702 m)   Wt 215 lb 1.6 oz (97.6 kg)   BMI 33.69 kg/m   Constitutional: Well nourished. Alert and oriented, No acute distress. HEENT: [place] AT, moist mucus membranes. Trachea midline, no masses. Cardiovascular: No clubbing, cyanosis, or edema. Respiratory: Normal respiratory effort, no increased work of breathing. GI: Abdomen is soft, non tender, non distended, no abdominal masses. Liver and spleen not palpable.  No hernias appreciated.  Stool sample for occult testing is not indicated.   GU: No CVA tenderness.  No bladder fullness or masses.  Patient with circumcised phallus.  Urethral meatus is patent.  No penile discharge. No penile lesions or rashes. Scrotum  without lesions, cysts, rashes and/or edema.  Testicles are located scrotally bilaterally. No masses are appreciated in the testicles. Left and right epididymis are normal. Rectal: Patient with  normal sphincter tone. Anus and perineum without scarring or rashes. No rectal masses are appreciated. Prostate is surgically absent. Seminal vesicles are surgically absent.   Skin: No rashes, bruises or suspicious lesions. Lymph: No cervical or inguinal adenopathy. Neurologic: Grossly intact, no focal deficits, moving all 4 extremities. Psychiatric: Normal mood and affect.  Laboratory Data:  Results for orders placed or performed in visit on 12/23/14  PSA  Result Value Ref Range   Prostate Specific Ag, Serum <0.1 0.0 - 4.0 ng/mL    Lab Results  Component Value Date   WBC 12.6 (H) 01/20/2013   HGB 16.3 01/20/2013   HCT 47.9 01/20/2013   MCV 84 01/20/2013   PLT 245 01/20/2013    Lab Results  Component Value Date   CREATININE 1.09 01/20/2013    Assessment & Plan:    1. History of prostate cancer:   Patient underwent a radical prostatectomy proximately 5 years ago by Dr. Rosana Hoes in [place]. His Gleason score was 6 at the time of his biopsy.  His PSA's have been undetectable since that time.  His last PSA was <0.1 ng/mL on 06/25/2015.  We have drawn a PSA today.  He will continue to follow-up on a six-month basis with PSA's and DRE's. Patient is quite anxious concerning his history of prostate cancer.  2. Erectile dysfunction:   Patient has experienced erectile dysfunction since he underwent his radical prostatectomy. His SHIM score is < 5. He has been unresponsive to PDE 5 inhibitors. He does not wish to pursue urethral pellets, intra cavernosal injections or the vacuum erect aid device.  We discussed penile prosthesis placement.  He is still undecided  about this procedure.  3. Incontinence:   Patient has experienced incontinence since he underwent his radical prostatectomy. His IPSS score is  5/1.  He has tried anticholinergics in the past without success. He wears depends daily.  We discussed the urethral sling procedure. He is still undecided about this procedure.   Return in about 6 months (around 06/24/2016) for IPSS, PSA and exam.  Zara Council, [facility]  [facility] [address], [place], [place] [address] [phone]

## 2015-12-24 LAB — PSA: Prostate Specific Ag, Serum: <0.1 ng/mL (ref 0.0–4.0)

## 2016-04-13 ENCOUNTER — Other Ambulatory Visit: Payer: Self-pay | Admitting: Unknown Physician Specialty

## 2016-04-13 DIAGNOSIS — M545 Low back pain, unspecified: Secondary | ICD-10-CM

## 2016-04-19 ENCOUNTER — Encounter (INDEPENDENT_AMBULATORY_CARE_PROVIDER_SITE_OTHER): Payer: Self-pay

## 2016-04-19 ENCOUNTER — Ambulatory Visit (INDEPENDENT_AMBULATORY_CARE_PROVIDER_SITE_OTHER): Payer: Medicare Other | Admitting: Vascular Surgery

## 2016-04-19 ENCOUNTER — Encounter (INDEPENDENT_AMBULATORY_CARE_PROVIDER_SITE_OTHER): Payer: Self-pay | Admitting: Vascular Surgery

## 2016-04-19 DIAGNOSIS — G8929 Other chronic pain: Secondary | ICD-10-CM

## 2016-04-19 DIAGNOSIS — Z794 Long term (current) use of insulin: Secondary | ICD-10-CM

## 2016-04-19 DIAGNOSIS — M5440 Lumbago with sciatica, unspecified side: Secondary | ICD-10-CM

## 2016-04-19 DIAGNOSIS — M79605 Pain in left leg: Secondary | ICD-10-CM

## 2016-04-19 DIAGNOSIS — I739 Peripheral vascular disease, unspecified: Secondary | ICD-10-CM | POA: Diagnosis not present

## 2016-04-19 DIAGNOSIS — M79604 Pain in right leg: Secondary | ICD-10-CM | POA: Diagnosis not present

## 2016-04-19 DIAGNOSIS — E119 Type 2 diabetes mellitus without complications: Secondary | ICD-10-CM

## 2016-04-19 DIAGNOSIS — I10 Essential (primary) hypertension: Secondary | ICD-10-CM | POA: Diagnosis not present

## 2016-04-19 DIAGNOSIS — M549 Dorsalgia, unspecified: Secondary | ICD-10-CM | POA: Insufficient documentation

## 2016-04-19 DIAGNOSIS — E118 Type 2 diabetes mellitus with unspecified complications: Secondary | ICD-10-CM | POA: Insufficient documentation

## 2016-04-19 NOTE — Progress Notes (Signed)
MRN : [id number]  [name] is a 81 y.o. (Oct 02, 1934) male who presents with chief complaint of  Chief Complaint  Patient presents with  . New Evaluation    Poor pedal pulses  .  History of Present Illness: The patient is seen for evaluation of painful lower extremities. Patient notes the pain is variable and not always associated with activity.  The pain is somewhat consistent day to day occurring on most days. The patient notes the pain also occurs with standing and routinely seems worse as the day wears on. The pain has been progressive over the past several years. The patient states these symptoms are causing  a profound negative impact on quality of life and daily activities.  The patient denies rest pain or dangling of an extremity off the side of the bed during the night for relief. No open wounds or sores at this time. No history of DVT or phlebitis. No prior interventions or surgeries.  There is a  history of back problems and DJD of the lumbar and sacral spine.    Current Meds  Medication Sig  . amLODipine-benazepril (LOTREL) 5-20 MG per capsule   . atorvastatin (LIPITOR) 80 MG tablet Take 80 mg by mouth daily.  . Blood Glucose Monitoring Suppl (FIFTY50 GLUCOSE METER 2.0) w/Device KIT Use as directed.  . Blood Glucose Monitoring Suppl (ONE TOUCH ULTRA 2) W/DEVICE KIT Use as directed.  . canagliflozin (INVOKANA) 300 MG TABS tablet Take 300 mg by mouth daily before breakfast.  . CARTIA XT 180 MG 24 hr capsule   . clobetasol ointment (TEMOVATE) 0.05 %   . diltiazem (CARDIZEM CD) 240 MG 24 hr capsule Take 240 mg by mouth daily.  . Exenatide ER 2 MG PEN Inject into the skin once a week. Reported on 06/25/2015  . furosemide (LASIX) 20 MG tablet TAKE 1 TABLET DAILY  . glucose blood (ONE TOUCH ULTRA TEST) test strip Use 2 (two) times daily. Use as instructed.  . Lancets 30G MISC Use 1 Device 2 (two) times daily.  . metFORMIN (GLUCOPHAGE) 1000 MG tablet TAKE 1 TABLET TWICE  A DAY WITH MEALS  . metoprolol succinate (TOPROL-XL) 100 MG 24 hr tablet Take 100 mg by mouth daily. Take with or immediately following a meal.  . ONETOUCH DELICA LANCETS FINE MISC Use 1 Device 2 (two) times daily.  . pioglitazone (ACTOS) 30 MG tablet Take 30 mg by mouth daily.  . TRULICITY 1.5 KW/4.0XB SOPN   . valsartan (DIOVAN) 320 MG tablet TAKE 1 TABLET DAILY  . warfarin (COUMADIN) 5 MG tablet Take 5 mg by mouth daily.    Past Medical History:  Diagnosis Date  . Carcinoma of prostate ([place])   . ED (erectile dysfunction)   . Elevated blood pressure   . Frequent urination   . Prostate cancer ([place])   . Urinary stress incontinence, male     Past Surgical History:  Procedure Laterality Date  . PROSTATE SURGERY     removal    Social History Social History  Substance Use Topics  . Smoking status: Former Smoker    Quit date: 10/14/1994  . Smokeless tobacco: Never Used  . Alcohol use No    Family History Family History  Problem Relation Age of Onset  . Prostate cancer Neg Hx   . Bladder Cancer Neg Hx   . Kidney disease Neg Hx   No family history of bleeding/clotting disorders, porphyria or autoimmune disease   Allergies  Allergen Reactions  .  Contrast Media [Iodinated Diagnostic Agents]     SOB  . Iohexol      Desc: Respiratory Distress, Laryngedema, diaphoresis, Onset Date: [id number]   . Latex Rash     REVIEW OF SYSTEMS (Negative unless checked)  Constitutional: '[]' Weight loss  '[]' Fever  '[]' Chills Cardiac: '[]' Chest pain   '[]' Chest pressure   '[]' Palpitations   '[]' Shortness of breath when laying flat   '[]' Shortness of breath with exertion. Vascular:  '[x]' Pain in legs with walking   '[]' Pain in legs at rest  '[]' History of DVT   '[]' Phlebitis   '[x]' Swelling in legs   '[]' Varicose veins   '[]' Non-healing ulcers Pulmonary:   '[]' Uses home oxygen   '[]' Productive cough   '[]' Hemoptysis   '[]' Wheeze  '[]' COPD   '[]' Asthma Neurologic:  '[]' Dizziness   '[]' Seizures   '[]' History of stroke   '[]' History of TIA   '[]' Aphasia   '[]' Vissual changes   '[]' Weakness or numbness in arm   '[x]' Weakness or numbness in leg Musculoskeletal:   '[]' Joint swelling   '[x]' Joint pain   '[x]' Low back pain Hematologic:  '[]' Easy bruising  '[]' Easy bleeding   '[]' Hypercoagulable state   '[]' Anemic Gastrointestinal:  '[]' Diarrhea   '[]' Vomiting  '[]' Gastroesophageal reflux/heartburn   '[]' Difficulty swallowing. Genitourinary:  '[]' Chronic kidney disease   '[]' Difficult urination  '[]' Frequent urination   '[]' Blood in urine Skin:  '[]' Rashes   '[]' Ulcers  Psychological:  '[]' History of anxiety   '[]'  History of major depression.  Physical Examination  Vitals:   04/19/16 0848  BP: (!) 168/92  Pulse: 64  Resp: 17  Weight: 221 lb (100.2 kg)  Height: '5\' 7"'  (1.702 m)   Body mass index is 34.61 kg/m. Gen: WD/WN, NAD Head: [place]/AT, No temporalis wasting.  Ear/Nose/Throat: Hearing grossly intact, nares w/o erythema or drainage, poor dentition Eyes: PER, EOMI, sclera nonicteric.  Neck: Supple, no masses.  No bruit or JVD.  Pulmonary:  Good air movement, clear to auscultation bilaterally, no use of accessory muscles.  Cardiac: RRR, normal S1, S2, no Murmurs. Vascular: mild venous changes bilaterally with 2+ edema pitting bilaterally. Vessel Right Left  Radial Palpable Palpable  Ulnar Palpable Palpable  Brachial Palpable Palpable  Carotid Palpable Palpable  Femoral Palpable Palpable  Popliteal Palpable Palpable  PT Not Palpable Not Palpable  DP Not Palpable Not Palpable   Gastrointestinal: soft, non-distended. No guarding/no peritoneal signs.  Musculoskeletal: M/S 5/5 throughout.  No deformity or atrophy.  Neurologic: CN 2-12 intact. Pain and light touch intact in extremities.  Symmetrical.  Speech is fluent. Motor exam as listed above. Psychiatric: Judgment intact, Mood & affect appropriate for pt's clinical situation. Dermatologic: No rashes or ulcers noted.  No changes consistent with cellulitis. Lymph : No Cervical lymphadenopathy, no lichenification or  skin changes of chronic lymphedema.  CBC Lab Results  Component Value Date   WBC 12.6 (H) 01/20/2013   HGB 16.3 01/20/2013   HCT 47.9 01/20/2013   MCV 84 01/20/2013   PLT 245 01/20/2013    BMET    Component Value Date/Time   NA 140 01/20/2013 0255   K 3.5 01/20/2013 0255   CL 103 01/20/2013 0255   CO2 30 01/20/2013 0255   GLUCOSE 145 (H) 01/20/2013 0255   BUN 22 (H) 01/20/2013 0255   CREATININE 1.09 01/20/2013 0255   CALCIUM 9.4 01/20/2013 0255   GFRNONAA >60 01/20/2013 0255   GFRAA >60 01/20/2013 0255   CrCl cannot be calculated (Patient's most recent lab result is older than the maximum 21 days allowed.).  COAG Lab Results  Component Value  Date   INR 2.3 01/20/2013   INR 2.2 01/19/2013    Radiology No results found.  Assessment/Plan 1. PAD (peripheral artery disease) (HCC)  Recommend:  The patient has atypical pain symptoms for pure atherosclerotic disease. However, on physical exam there is evidence of mixed venous and arterial disease, given the diminished pulses and the edema associated with venous changes of the legs.  Noninvasive studies including ABI's and venous ultrasound of the legs will be obtained and the patient will follow up with me to review these studies.  The patient should continue walking and begin a more formal exercise program. The patient should continue his antiplatelet therapy and aggressive treatment of the lipid abnormalities.  The patient should begin wearing graduated compression socks 15-20 mmHg strength to control edema.   2. Pain in both lower extremities See #1  3. Chronic left-sided low back pain with sciatica, sciatica laterality unspecified Continue Nsaid therapy and physical therapy as established  4. Type 2 diabetes mellitus treated with insulin ([place]) Continue hypoglycemic medications as already ordered and reviewed, no changes at this time.  5. Essential hypertension Continue antihypertensive medications as already  ordered and reviewed, no changes at this time.  Continue statin as ordered and reviewed, no changes at this time    Hortencia Pilar, MD  04/19/2016 4:35 PM

## 2016-04-20 ENCOUNTER — Encounter (INDEPENDENT_AMBULATORY_CARE_PROVIDER_SITE_OTHER): Payer: Self-pay | Admitting: Vascular Surgery

## 2016-04-20 ENCOUNTER — Ambulatory Visit (INDEPENDENT_AMBULATORY_CARE_PROVIDER_SITE_OTHER): Payer: Medicare Other | Admitting: Vascular Surgery

## 2016-04-20 ENCOUNTER — Ambulatory Visit (INDEPENDENT_AMBULATORY_CARE_PROVIDER_SITE_OTHER): Payer: Medicare Other

## 2016-04-20 VITALS — BP 146/94 | HR 61 | Resp 16 | Ht 67.0 in | Wt 218.0 lb

## 2016-04-20 DIAGNOSIS — I739 Peripheral vascular disease, unspecified: Secondary | ICD-10-CM

## 2016-04-20 DIAGNOSIS — E785 Hyperlipidemia, unspecified: Secondary | ICD-10-CM | POA: Insufficient documentation

## 2016-04-20 DIAGNOSIS — I10 Essential (primary) hypertension: Secondary | ICD-10-CM | POA: Diagnosis not present

## 2016-04-20 DIAGNOSIS — Z794 Long term (current) use of insulin: Secondary | ICD-10-CM | POA: Diagnosis not present

## 2016-04-20 DIAGNOSIS — E119 Type 2 diabetes mellitus without complications: Secondary | ICD-10-CM | POA: Diagnosis not present

## 2016-04-20 NOTE — Progress Notes (Signed)
Subjective:    Patient ID: [name], male    DOB: 04-18-1935, 81 y.o.   MRN: [id number] Chief Complaint  Patient presents with  . Follow-up   Patient presents to review vascular studies. He was last seen as new patient yesterday for "poor pedal pulses". The patient underwent an ABI which showed Right ABI: 0.72 and Left 0.91, great toe pressure and PPG waveform are decreased bilateraly (no previous for comparison). The patient denies any rest pain or ulcers to his feet / toes. He does experience some mild right lower extremity claudication. States 4 out of 10 pain scale. His discomfort does not interfere with his ability function on a daily basis.    Review of Systems  Constitutional: Negative.   HENT: Negative.   Eyes: Negative.   Respiratory: Negative.   Cardiovascular:       Bilateral Lower Extremity Claudication  Gastrointestinal: Negative.   Endocrine: Negative.   Genitourinary: Negative.   Musculoskeletal: Negative.   Skin: Negative.   Allergic/Immunologic: Negative.   Neurological: Negative.   Hematological: Negative.   Psychiatric/Behavioral: Negative.       Objective:   Physical Exam  Constitutional: He is oriented to person, place, and time. He appears well-developed and well-nourished.  HENT:  Head: Normocephalic and atraumatic.  Right Ear: External ear normal.  Left Ear: External ear normal.  Eyes: Conjunctivae and EOM are normal. Pupils are equal, round, and reactive to light.  Neck: Normal range of motion.  Cardiovascular: Normal rate, regular rhythm, normal heart sounds and intact distal pulses.   Pulses:      Radial pulses are 2+ on the right side, and 2+ on the left side.       Dorsalis pedis pulses are 0 on the right side, and 1+ on the left side.       Posterior tibial pulses are 0 on the right side, and 0 on the left side.  Pulmonary/Chest: Effort normal and breath sounds normal.  Abdominal: Soft. Bowel sounds are normal.  Musculoskeletal:  Normal range of motion. He exhibits edema (Mild Bilaterally).  Neurological: He is alert and oriented to person, place, and time.  Skin: Skin is warm and dry.  Psychiatric: He has a normal mood and affect. His behavior is normal. Judgment and thought content normal.   BP (!) 146/94   Pulse 61   Resp 16   Ht '5\' 7"'  (1.702 m)   Wt 218 lb (98.9 kg)   BMI 34.14 kg/m   Past Medical History:  Diagnosis Date  . Carcinoma of prostate ([place])   . ED (erectile dysfunction)   . Elevated blood pressure   . Frequent urination   . Prostate cancer ([place])   . Urinary stress incontinence, male    Social History   Social History  . Marital status: Married    Spouse name: N/A  . Number of children: N/A  . Years of education: N/A   Occupational History  . Not on file.   Social History Main Topics  . Smoking status: Former Smoker    Quit date: 10/14/1994  . Smokeless tobacco: Never Used  . Alcohol use No  . Drug use: No  . Sexual activity: Not on file   Other Topics Concern  . Not on file   Social History Narrative  . No narrative on file   Past Surgical History:  Procedure Laterality Date  . PROSTATE SURGERY     removal   Family History  Problem Relation Age  of Onset  . Prostate cancer Neg Hx   . Bladder Cancer Neg Hx   . Kidney disease Neg Hx    Allergies  Allergen Reactions  . Contrast Media [Iodinated Diagnostic Agents]     SOB  . Iohexol      Desc: Respiratory Distress, Laryngedema, diaphoresis, Onset Date: [id number]   . Latex Rash      Assessment & Plan:   1. Hyperlipidemia, unspecified hyperlipidemia type _ Stable On statin for medical optimization. Encouraged good control as its slows the progression of atherosclerotic disease  2. PAD (peripheral artery disease) ([place]) - New Studies reviewed with patient. Mild non-lifestyle limiting symptoms with moderate right lower extremity disease. No intervention indicated at this time.   Patient to follow up in six  months. Patient to continue medical optimization with dyslipidemia medication. Patient to remain abstinent of tobacco use. I have discussed with the patient at length the risk factors for and pathogenesis of atherosclerotic disease and encouraged a healthy diet, regular exercise regimen and blood pressure / glucose control.  The patient was encouraged to call the office in the interim if he experiences any claudication like symptoms, rest pain or ulcers to his feet / toes.  - VAS [place] ABI WITH/WO TBI; Future - VAS [place] LOWER EXTREMITY ARTERIAL DUPLEX; Future  3. Essential hypertension - Stable Encouraged good control as its slows the progression of atherosclerotic disease  4. Type 2 diabetes mellitus treated with insulin (HCC) - Stable Encouraged good control as its slows the progression of atherosclerotic disease Dictation #1 [id number]  [id number]  Current Outpatient Prescriptions on File Prior to Visit  Medication Sig Dispense Refill  . amLODipine-benazepril (LOTREL) 5-20 MG per capsule     . atorvastatin (LIPITOR) 80 MG tablet Take 80 mg by mouth daily.    . Blood Glucose Monitoring Suppl (FIFTY50 GLUCOSE METER 2.0) w/Device KIT Use as directed.    . Blood Glucose Monitoring Suppl (ONE TOUCH ULTRA 2) W/DEVICE KIT Use as directed.    . canagliflozin (INVOKANA) 300 MG TABS tablet Take 300 mg by mouth daily before breakfast.    . CARTIA XT 180 MG 24 hr capsule     . clobetasol ointment (TEMOVATE) 0.05 %     . diltiazem (CARDIZEM CD) 240 MG 24 hr capsule Take 240 mg by mouth daily.    . Exenatide ER 2 MG PEN Inject into the skin once a week. Reported on 06/25/2015    . furosemide (LASIX) 20 MG tablet TAKE 1 TABLET DAILY    . glucose blood (ONE TOUCH ULTRA TEST) test strip Use 2 (two) times daily. Use as instructed.    . Lancets 30G MISC Use 1 Device 2 (two) times daily.    . metFORMIN (GLUCOPHAGE) 1000 MG tablet TAKE 1 TABLET TWICE A DAY WITH MEALS    . metoprolol succinate  (TOPROL-XL) 100 MG 24 hr tablet Take 100 mg by mouth daily. Take with or immediately following a meal.    . ONETOUCH DELICA LANCETS FINE MISC Use 1 Device 2 (two) times daily.    . pioglitazone (ACTOS) 30 MG tablet Take 30 mg by mouth daily.    . TRULICITY 1.5 NL/9.7QB SOPN     . valsartan (DIOVAN) 320 MG tablet TAKE 1 TABLET DAILY    . warfarin (COUMADIN) 5 MG tablet Take 5 mg by mouth daily.     No current facility-administered medications on file prior to visit.    There are no Patient Instructions on file for  this visit. Return in about 6 months (around 10/18/2016) for Six Month PAD Follow Up.  Maleyah Evans A Sarin [place], PA-C

## 2016-04-23 DIAGNOSIS — J189 Pneumonia, unspecified organism: Secondary | ICD-10-CM

## 2016-04-23 HISTORY — DX: Pneumonia, unspecified organism: J18.9

## 2016-04-29 ENCOUNTER — Ambulatory Visit
Admission: RE | Admit: 2016-04-29 | Discharge: 2016-04-29 | Disposition: A | Discharge status: Home / Self Care | Payer: Medicare Other | Source: Ambulatory Visit | Attending: Unknown Physician Specialty | Admitting: Unknown Physician Specialty

## 2016-04-29 DIAGNOSIS — M5136 Other intervertebral disc degeneration, lumbar region: Secondary | ICD-10-CM | POA: Insufficient documentation

## 2016-04-29 DIAGNOSIS — M545 Low back pain, unspecified: Secondary | ICD-10-CM

## 2016-04-29 DIAGNOSIS — M48061 Spinal stenosis, lumbar region without neurogenic claudication: Secondary | ICD-10-CM | POA: Diagnosis not present

## 2016-05-18 ENCOUNTER — Inpatient Hospital Stay
Admission: EM | Admit: 2016-05-18 | Discharge: 2016-05-22 | DRG: 871 | Disposition: A | Discharge status: Home Health | Payer: Medicare Other | Attending: Internal Medicine | Admitting: Internal Medicine

## 2016-05-18 ENCOUNTER — Emergency Department: Payer: Medicare Other

## 2016-05-18 DIAGNOSIS — E871 Hypo-osmolality and hyponatremia: Secondary | ICD-10-CM | POA: Diagnosis present

## 2016-05-18 DIAGNOSIS — Z91041 Radiographic dye allergy status: Secondary | ICD-10-CM

## 2016-05-18 DIAGNOSIS — I110 Hypertensive heart disease with heart failure: Secondary | ICD-10-CM | POA: Diagnosis present

## 2016-05-18 DIAGNOSIS — Z87891 Personal history of nicotine dependence: Secondary | ICD-10-CM

## 2016-05-18 DIAGNOSIS — A419 Sepsis, unspecified organism: Principal | ICD-10-CM | POA: Diagnosis present

## 2016-05-18 DIAGNOSIS — Z7901 Long term (current) use of anticoagulants: Secondary | ICD-10-CM

## 2016-05-18 DIAGNOSIS — I38 Endocarditis, valve unspecified: Secondary | ICD-10-CM | POA: Diagnosis present

## 2016-05-18 DIAGNOSIS — Z91040 Latex allergy status: Secondary | ICD-10-CM

## 2016-05-18 DIAGNOSIS — E119 Type 2 diabetes mellitus without complications: Secondary | ICD-10-CM | POA: Diagnosis present

## 2016-05-18 DIAGNOSIS — R000 Tachycardia, unspecified: Secondary | ICD-10-CM | POA: Diagnosis present

## 2016-05-18 DIAGNOSIS — I4891 Unspecified atrial fibrillation: Secondary | ICD-10-CM

## 2016-05-18 DIAGNOSIS — I482 Chronic atrial fibrillation: Secondary | ICD-10-CM | POA: Diagnosis present

## 2016-05-18 DIAGNOSIS — Z794 Long term (current) use of insulin: Secondary | ICD-10-CM

## 2016-05-18 DIAGNOSIS — Z79899 Other long term (current) drug therapy: Secondary | ICD-10-CM

## 2016-05-18 DIAGNOSIS — I5031 Acute diastolic (congestive) heart failure: Secondary | ICD-10-CM | POA: Diagnosis present

## 2016-05-18 DIAGNOSIS — R0600 Dyspnea, unspecified: Secondary | ICD-10-CM

## 2016-05-18 DIAGNOSIS — Z8546 Personal history of malignant neoplasm of prostate: Secondary | ICD-10-CM

## 2016-05-18 DIAGNOSIS — R0602 Shortness of breath: Secondary | ICD-10-CM

## 2016-05-18 DIAGNOSIS — J189 Pneumonia, unspecified organism: Secondary | ICD-10-CM | POA: Diagnosis present

## 2016-05-18 DIAGNOSIS — R262 Difficulty in walking, not elsewhere classified: Secondary | ICD-10-CM

## 2016-05-18 HISTORY — DX: Type 2 diabetes mellitus without complications: E11.9

## 2016-05-18 HISTORY — DX: Heart failure, unspecified: I50.9

## 2016-05-18 HISTORY — DX: Essential (primary) hypertension: I10

## 2016-05-18 LAB — COMPREHENSIVE METABOLIC PANEL
ALBUMIN: 3.5 g/dL (ref 3.5–5.0)
ALK PHOS: 120 U/L (ref 38–126)
ALT: 26 U/L (ref 17–63)
ANION GAP: 10 (ref 5–15)
AST: 32 U/L (ref 15–41)
BILIRUBIN TOTAL: 1.3 mg/dL — AB (ref 0.3–1.2)
BUN: 33 mg/dL — ABNORMAL HIGH (ref 6–20)
CALCIUM: 8.7 mg/dL — AB (ref 8.9–10.3)
CO2: 24 mmol/L (ref 22–32)
CREATININE: 1.32 mg/dL — AB (ref 0.61–1.24)
Chloride: 99 mmol/L — ABNORMAL LOW (ref 101–111)
GFR calc non Af Amer: 50 mL/min — ABNORMAL LOW (ref >60)
GFR, EST AFRICAN AMERICAN: 58 mL/min — AB (ref >60)
GLUCOSE: 283 mg/dL — AB (ref 65–99)
Potassium: 3.6 mmol/L (ref 3.5–5.1)
Sodium: 133 mmol/L — ABNORMAL LOW (ref 135–145)
Total Protein: 7.3 g/dL (ref 6.5–8.1)

## 2016-05-18 LAB — CBC WITH DIFFERENTIAL/PLATELET
Basophils Absolute: 0.1 10*3/uL (ref 0–0.1)
Basophils Relative: 1 %
Eosinophils Absolute: 0.5 10*3/uL (ref 0–0.7)
Eosinophils Relative: 6 %
HEMATOCRIT: 46.9 % (ref 40.0–52.0)
HEMOGLOBIN: 16 g/dL (ref 13.0–18.0)
LYMPHS ABS: 1.3 10*3/uL (ref 1.0–3.6)
Lymphocytes Relative: 16 %
MCH: 29.7 pg (ref 26.0–34.0)
MCHC: 34 g/dL (ref 32.0–36.0)
MCV: 87.4 fL (ref 80.0–100.0)
MONOS PCT: 12 %
Monocytes Absolute: 1 10*3/uL (ref 0.2–1.0)
NEUTROS ABS: 5.3 10*3/uL (ref 1.4–6.5)
NEUTROS PCT: 65 %
Platelets: 181 10*3/uL (ref 150–440)
RBC: 5.37 MIL/uL (ref 4.40–5.90)
RDW: 15.2 % — ABNORMAL HIGH (ref 11.5–14.5)
WBC: 8.1 10*3/uL (ref 3.8–10.6)

## 2016-05-18 LAB — LACTIC ACID, PLASMA: Lactic Acid, Venous: 2.4 mmol/L (ref 0.5–1.9)

## 2016-05-18 LAB — TROPONIN I: Troponin I: <0.03 ng/mL (ref <0.03)

## 2016-05-18 MED ORDER — DILTIAZEM HCL 100 MG IV SOLR
5.0000 mg/h | INTRAVENOUS | Status: DC
  Filled 2016-05-18: qty 100

## 2016-05-18 MED ORDER — DILTIAZEM LOAD VIA INFUSION
5.0000 mg | Freq: Once | INTRAVENOUS | Status: AC
  Administered 2016-05-18: 5 mg via INTRAVENOUS
  Filled 2016-05-18: qty 5

## 2016-05-18 MED ORDER — CEFTRIAXONE SODIUM-DEXTROSE 1-3.74 GM-% IV SOLR
1.0000 g | Freq: Once | INTRAVENOUS | Status: AC
  Administered 2016-05-18: 1 g via INTRAVENOUS
  Filled 2016-05-18: qty 50

## 2016-05-18 MED ORDER — DILTIAZEM HCL 25 MG/5ML IV SOLN
INTRAVENOUS | Status: AC
  Filled 2016-05-18: qty 5

## 2016-05-18 MED ORDER — DEXTROSE 5 % IV SOLN
500.0000 mg | Freq: Once | INTRAVENOUS | Status: AC
  Administered 2016-05-19: 500 mg via INTRAVENOUS
  Filled 2016-05-18: qty 500

## 2016-05-18 MED ORDER — DEXTROSE 5 % IV SOLN: 1.0000 g | Freq: Once | INTRAVENOUS | Status: DC

## 2016-05-18 NOTE — ED Provider Notes (Signed)
[facility] Emergency Department Provider Note   ____________________________________________   First MD Initiated Contact with Patient 05/18/16 2047     (approximate)  I have reviewed the triage vital signs and the nursing notes.   HISTORY  Chief Complaint Shortness of Breath  HPI [name] is a 81 y.o. male patient complains of increased shortness of breath for about a week. It's worse with exertion. Last couple days his been even worse. He's been coughing up green phlegm. He has not been running a fever. He has a history of atrial fibrillation.   Past Medical History:  Diagnosis Date  . Carcinoma of prostate ([place])   . CHF (congestive heart failure) ([place])   . Diabetes mellitus without complication ([place])   . ED (erectile dysfunction)   . Elevated blood pressure   . Frequent urination   . Hypertension   . Prostate cancer ([place])   . Urinary stress incontinence, male     Patient Active Problem List   Diagnosis Date Noted  . Hyperlipidemia 04/20/2016  . Leg pain, left 04/19/2016  . Back pain 04/19/2016  . Type 2 diabetes mellitus treated with insulin ([place]) 04/19/2016  . Essential hypertension 04/19/2016  . PAD (peripheral artery disease) ([place]) 04/19/2016  . Erectile dysfunction following radical prostatectomy 06/25/2015  . History of prostate cancer 12/23/2014  . Incontinence 12/23/2014    Past Surgical History:  Procedure Laterality Date  . PROSTATE SURGERY     removal    Prior to Admission medications   Medication Sig Start Date End Date Taking? Authorizing Provider  amLODipine-benazepril (LOTREL) 5-20 MG per capsule  11/11/14   Historical Provider, MD  atorvastatin (LIPITOR) 80 MG tablet Take 80 mg by mouth daily.    Historical Provider, MD  Blood Glucose Monitoring Suppl (FIFTY50 GLUCOSE METER 2.0) w/Device KIT Use as directed.    Historical Provider, MD  Blood Glucose Monitoring Suppl (ONE TOUCH ULTRA 2) W/DEVICE KIT Use as  directed.    Historical Provider, MD  canagliflozin (INVOKANA) 300 MG TABS tablet Take 300 mg by mouth daily before breakfast.    Historical Provider, MD  CARTIA XT 180 MG 24 hr capsule  11/18/15   Historical Provider, MD  clobetasol ointment (TEMOVATE) 0.05 %  11/12/14   Historical Provider, MD  diltiazem (CARDIZEM CD) 240 MG 24 hr capsule Take 240 mg by mouth daily.    Historical Provider, MD  Exenatide ER 2 MG PEN Inject into the skin once a week. Reported on 06/25/2015    Historical Provider, MD  furosemide (LASIX) 20 MG tablet TAKE 1 TABLET DAILY 10/24/15   Historical Provider, MD  glucose blood (ONE TOUCH ULTRA TEST) test strip Use 2 (two) times daily. Use as instructed. 07/11/14   Historical Provider, MD  Lancets 30G MISC Use 1 Device 2 (two) times daily.    Historical Provider, MD  metFORMIN (GLUCOPHAGE) 1000 MG tablet TAKE 1 TABLET TWICE A DAY WITH MEALS 11/28/15   Historical Provider, MD  metoprolol succinate (TOPROL-XL) 100 MG 24 hr tablet Take 100 mg by mouth daily. Take with or immediately following a meal.    Historical Provider, MD  [facility] DELICA LANCETS FINE MISC Use 1 Device 2 (two) times daily.    Historical Provider, MD  pioglitazone (ACTOS) 30 MG tablet Take 30 mg by mouth daily.    Historical Provider, MD  TRULICITY 1.5 CX/4.4YJ SOPN  12/11/14   Historical Provider, MD  valsartan (DIOVAN) 320 MG tablet TAKE 1 TABLET DAILY  11/28/15   Historical Provider, MD  warfarin (COUMADIN) 5 MG tablet Take 5 mg by mouth daily.    Historical Provider, MD    Allergies Contrast media [iodinated diagnostic agents]; Iohexol; and Latex  Family History  Problem Relation Age of Onset  . Prostate cancer Neg Hx   . Bladder Cancer Neg Hx   . Kidney disease Neg Hx     Social History Social History  Substance Use Topics  . Smoking status: Former Smoker    Quit date: 10/14/1994  . Smokeless tobacco: Never Used  . Alcohol use No    Review of Systems Constitutional: No fever/chills Eyes: No  visual changes. ENT: No sore throat. Cardiovascular: Denies chest pain. Respiratory:  shortness of breath. Gastrointestinal: No abdominal pain.  No nausea, no vomiting.  No diarrhea.  No constipation. Genitourinary: Negative for dysuria. Musculoskeletal: Negative for back pain. Skin: Negative for rash. Neurological: Negative for headaches, focal weakness or numbness.  10-point ROS otherwise negative.  ____________________________________________   PHYSICAL EXAM:  VITAL SIGNS: ED Triage Vitals  Enc Vitals Group     BP 05/18/16 2100 (!) 133/93     Pulse Rate 05/18/16 2033 100     Resp 05/18/16 2033 (!) 26     Temp 05/18/16 2033 97.8 F (36.6 C)     Temp Source 05/18/16 2033 Oral     SpO2 05/18/16 2029 95 %     Weight 05/18/16 2034 215 lb (97.5 kg)     Height 05/18/16 2034 _0  (1.702 m)     Head Circumference --      Peak Flow --      Pain Score 05/18/16 2034 0     Pain Loc --      Pain Edu? --      Excl. in [place]? --     Constitutional: Alert and oriented. Well appearing and in no acute distress. Eyes: Conjunctivae are normal. PERRL. EOMI. Head: Atraumatic. Nose: No congestion/rhinnorhea. Mouth/Throat: Mucous membranes are moist.  Oropharynx non-erythematous. Neck: No stridor. Cardiovascular: Normal rate, regular rhythm. Grossly normal heart sounds.  Good peripheral circulation. Respiratory: Normal respiratory effort.  No retractions. Lungs Scattered crackles and wheezes Gastrointestinal: Soft and nontender. No distention. No abdominal bruits. No CVA tenderness. Musculoskeletal: No lower extremity tenderness trace edema.  No joint effusions. Neurologic:  Normal speech and language. No gross focal neurologic deficits are appreciated. No gait instability. Skin:  Skin is warm, dry and intact. No rash noted. Psychiatric: Mood and affect are normal. Speech and behavior are normal.  ____________________________________________   LABS (all labs ordered are listed, but only  abnormal results are displayed)  Labs Reviewed  CBC WITH DIFFERENTIAL/PLATELET - Abnormal; Notable for the following:       Result Value   RDW 15.2 (*)    All other components within normal limits  COMPREHENSIVE METABOLIC PANEL - Abnormal; Notable for the following:    Sodium 133 (*)    Chloride 99 (*)    Glucose, Bld 283 (*)    BUN 33 (*)    Creatinine, Ser 1.32 (*)    Calcium 8.7 (*)    Total Bilirubin 1.3 (*)    GFR calc non Af Amer 50 (*)    GFR calc Af Amer 58 (*)    All other components within normal limits  LACTIC ACID, PLASMA - Abnormal; Notable for the following:    Lactic Acid, Venous 2.4 (*)    All other components within normal limits  CULTURE, BLOOD (ROUTINE  X 2)  CULTURE, BLOOD (ROUTINE X 2)  TROPONIN I  LACTIC ACID, PLASMA  BRAIN NATRIURETIC PEPTIDE   ____________________________________________  [facility] read and interpreted by me shows A. fib at a rate of 157 a right axis no acute ST-T wave changes ____________________________________________  RADIOLOGY  Study Result   CLINICAL DATA:  Acute onset of shortness of breath on exertion. Productive cough. Tachycardia and expiratory wheezing. Initial encounter.  EXAM: CHEST  2 VIEW  COMPARISON:  Chest radiograph performed 01/19/2013  FINDINGS: The lungs are well-aerated. Mild right basilar airspace opacity could reflect pneumonia, depending on the patient's symptoms. There is no evidence of pleural effusion or pneumothorax.  The heart is borderline enlarged. No acute osseous abnormalities are seen.  IMPRESSION: Mild right basilar airspace opacity could reflect pneumonia, depending on the patient's symptoms. Borderline cardiomegaly.   Electronically Signed   By: Garald Balding M.D.   On: 05/18/2016 21:24     ____________________________________________   PROCEDURES  Procedure(s) performed:   Procedures  Critical Care performed:    ____________________________________________   INITIAL IMPRESSION / ASSESSMENT AND PLAN / ED COURSE  Pertinent labs & imaging results that were available during my care of the patient were reviewed by me and considered in my medical decision making (see chart for details).    Clinical Course      ____________________________________________   FINAL CLINICAL IMPRESSION(S) / ED DIAGNOSES  Final diagnoses:  Atrial fibrillation, unspecified type (HCC)  Shortness of breath  Dyspnea, unspecified type      NEW MEDICATIONS STARTED DURING THIS VISIT:  New Prescriptions   No medications on file     Note:  This document was prepared using Dragon voice recognition software and may include unintentional dictation errors.    Nena Polio, MD 05/19/16 [phone]

## 2016-05-18 NOTE — ED Notes (Signed)
Pt arrived via ems for c/o shortness of breath on exertion for the last 2 days - states he has a productive cough with green mucus - hx of CHF and afib - HR 100-140's - noted expiratory wheeze in bilat upper lobes

## 2016-05-18 NOTE — ED Notes (Signed)
Dr Cinda Quest notified of elevated lactic acid

## 2016-05-18 NOTE — ED Triage Notes (Signed)
Pt arrived via ems for c/o shortness of breath on exertion for the last 2 days - states he has a productive cough with green mucus - hx of CHF and afib - HR 100-140's - noted expiratory wheeze in bilat upper lobes

## 2016-05-18 NOTE — ED Notes (Signed)
Called pharmacy to send cardizem

## 2016-05-19 ENCOUNTER — Inpatient Hospital Stay
Admit: 2016-05-19 | Discharge: 2016-05-19 | Disposition: A | Discharge status: Home / Self Care | Payer: Medicare Other | Attending: Internal Medicine | Admitting: Internal Medicine

## 2016-05-19 ENCOUNTER — Encounter: Payer: Self-pay | Admitting: Internal Medicine

## 2016-05-19 DIAGNOSIS — Z7901 Long term (current) use of anticoagulants: Secondary | ICD-10-CM | POA: Diagnosis not present

## 2016-05-19 DIAGNOSIS — E119 Type 2 diabetes mellitus without complications: Secondary | ICD-10-CM | POA: Diagnosis present

## 2016-05-19 DIAGNOSIS — Z91041 Radiographic dye allergy status: Secondary | ICD-10-CM | POA: Diagnosis not present

## 2016-05-19 DIAGNOSIS — Z91040 Latex allergy status: Secondary | ICD-10-CM | POA: Diagnosis not present

## 2016-05-19 DIAGNOSIS — Z794 Long term (current) use of insulin: Secondary | ICD-10-CM | POA: Diagnosis not present

## 2016-05-19 DIAGNOSIS — J189 Pneumonia, unspecified organism: Secondary | ICD-10-CM | POA: Diagnosis present

## 2016-05-19 DIAGNOSIS — R000 Tachycardia, unspecified: Secondary | ICD-10-CM | POA: Diagnosis present

## 2016-05-19 DIAGNOSIS — Z87891 Personal history of nicotine dependence: Secondary | ICD-10-CM | POA: Diagnosis not present

## 2016-05-19 DIAGNOSIS — R0602 Shortness of breath: Secondary | ICD-10-CM | POA: Diagnosis present

## 2016-05-19 DIAGNOSIS — I5031 Acute diastolic (congestive) heart failure: Secondary | ICD-10-CM | POA: Diagnosis present

## 2016-05-19 DIAGNOSIS — Z79899 Other long term (current) drug therapy: Secondary | ICD-10-CM | POA: Diagnosis not present

## 2016-05-19 DIAGNOSIS — I38 Endocarditis, valve unspecified: Secondary | ICD-10-CM | POA: Diagnosis present

## 2016-05-19 DIAGNOSIS — E871 Hypo-osmolality and hyponatremia: Secondary | ICD-10-CM | POA: Diagnosis present

## 2016-05-19 DIAGNOSIS — I110 Hypertensive heart disease with heart failure: Secondary | ICD-10-CM | POA: Diagnosis present

## 2016-05-19 DIAGNOSIS — A419 Sepsis, unspecified organism: Secondary | ICD-10-CM | POA: Diagnosis present

## 2016-05-19 DIAGNOSIS — Z8546 Personal history of malignant neoplasm of prostate: Secondary | ICD-10-CM | POA: Diagnosis not present

## 2016-05-19 DIAGNOSIS — I482 Chronic atrial fibrillation: Secondary | ICD-10-CM | POA: Diagnosis present

## 2016-05-19 LAB — CBC
HEMATOCRIT: 43.1 % (ref 40.0–52.0)
Hemoglobin: 14.7 g/dL (ref 13.0–18.0)
MCH: 29.4 pg (ref 26.0–34.0)
MCHC: 34.1 g/dL (ref 32.0–36.0)
MCV: 86.1 fL (ref 80.0–100.0)
Platelets: 205 10*3/uL (ref 150–440)
RBC: 5 MIL/uL (ref 4.40–5.90)
RDW: 14.8 % — AB (ref 11.5–14.5)
WBC: 7.6 10*3/uL (ref 3.8–10.6)

## 2016-05-19 LAB — BASIC METABOLIC PANEL
Anion gap: 9 (ref 5–15)
BUN: 30 mg/dL — AB (ref 6–20)
CO2: 25 mmol/L (ref 22–32)
Calcium: 8.5 mg/dL — ABNORMAL LOW (ref 8.9–10.3)
Chloride: 103 mmol/L (ref 101–111)
Creatinine, Ser: 1.26 mg/dL — ABNORMAL HIGH (ref 0.61–1.24)
GFR calc non Af Amer: 52 mL/min — ABNORMAL LOW (ref >60)
Glucose, Bld: 175 mg/dL — ABNORMAL HIGH (ref 65–99)
POTASSIUM: 3.5 mmol/L (ref 3.5–5.1)
SODIUM: 137 mmol/L (ref 135–145)

## 2016-05-19 LAB — BRAIN NATRIURETIC PEPTIDE: B NATRIURETIC PEPTIDE 5: 324 pg/mL — AB (ref 0.0–100.0)

## 2016-05-19 LAB — PROTIME-INR
INR: 1.6
Prothrombin Time: 19.2 seconds — ABNORMAL HIGH (ref 11.4–15.2)

## 2016-05-19 LAB — ECHOCARDIOGRAM COMPLETE
HEIGHTINCHES: 67 in
Weight: 3440 oz

## 2016-05-19 LAB — GLUCOSE, CAPILLARY
GLUCOSE-CAPILLARY: 171 mg/dL — AB (ref 65–99)
GLUCOSE-CAPILLARY: 179 mg/dL — AB (ref 65–99)
GLUCOSE-CAPILLARY: 162 mg/dL — AB (ref 65–99)
Glucose-Capillary: 177 mg/dL — ABNORMAL HIGH (ref 65–99)
Glucose-Capillary: 145 mg/dL — ABNORMAL HIGH (ref 65–99)

## 2016-05-19 LAB — PROCALCITONIN: Procalcitonin: <0.1 ng/mL

## 2016-05-19 LAB — TROPONIN I: Troponin I: <0.03 ng/mL (ref <0.03)

## 2016-05-19 LAB — LACTIC ACID, PLASMA: Lactic Acid, Venous: 1.5 mmol/L (ref 0.5–1.9)

## 2016-05-19 MED ORDER — ASPIRIN EC 81 MG PO TBEC
81.0000 mg | DELAYED_RELEASE_TABLET | Freq: Every day | ORAL | Status: DC
  Administered 2016-05-19 – 2016-05-22 (×4): 81 mg via ORAL
  Filled 2016-05-19 (×6): qty 1

## 2016-05-19 MED ORDER — SENNOSIDES-DOCUSATE SODIUM 8.6-50 MG PO TABS: 1.0000 | ORAL_TABLET | Freq: Every evening | ORAL | Status: DC | PRN

## 2016-05-19 MED ORDER — WARFARIN SODIUM 5 MG PO TABS
5.0000 mg | ORAL_TABLET | Freq: Every day | ORAL | Status: DC
  Administered 2016-05-19 – 2016-05-21 (×3): 5 mg via ORAL
  Filled 2016-05-19 (×3): qty 1

## 2016-05-19 MED ORDER — DEXTROSE 5 % IV SOLN: 500.0000 mg | INTRAVENOUS | Status: DC

## 2016-05-19 MED ORDER — CEFTRIAXONE SODIUM-DEXTROSE 1-3.74 GM-% IV SOLR
1.0000 g | INTRAVENOUS | Status: DC
  Administered 2016-05-19: 1 g via INTRAVENOUS
  Filled 2016-05-19 (×2): qty 50

## 2016-05-19 MED ORDER — SODIUM CHLORIDE 0.9 % IV SOLN: 250.0000 mL | INTRAVENOUS | Status: DC | PRN

## 2016-05-19 MED ORDER — INSULIN ASPART 100 UNIT/ML ~~LOC~~ SOLN
0.0000 [IU] | Freq: Three times a day (TID) | SUBCUTANEOUS | Status: DC
  Administered 2016-05-19: 2 [IU] via SUBCUTANEOUS
  Administered 2016-05-19 – 2016-05-21 (×6): 3 [IU] via SUBCUTANEOUS
  Administered 2016-05-21 – 2016-05-22 (×4): 2 [IU] via SUBCUTANEOUS
  Filled 2016-05-19: qty 3
  Filled 2016-05-19 (×2): qty 2
  Filled 2016-05-19 (×4): qty 3
  Filled 2016-05-19: qty 2
  Filled 2016-05-19: qty 3
  Filled 2016-05-19 (×2): qty 2

## 2016-05-19 MED ORDER — DILTIAZEM HCL ER COATED BEADS 180 MG PO CP24
180.0000 mg | ORAL_CAPSULE | Freq: Every day | ORAL | Status: DC
  Administered 2016-05-19 – 2016-05-20 (×2): 180 mg via ORAL
  Filled 2016-05-19 (×2): qty 1

## 2016-05-19 MED ORDER — METOPROLOL SUCCINATE ER 100 MG PO TB24
100.0000 mg | ORAL_TABLET | Freq: Every day | ORAL | Status: DC
  Administered 2016-05-19 – 2016-05-22 (×4): 100 mg via ORAL
  Filled 2016-05-19 (×4): qty 1

## 2016-05-19 MED ORDER — IPRATROPIUM-ALBUTEROL 0.5-2.5 (3) MG/3ML IN SOLN
3.0000 mL | Freq: Four times a day (QID) | RESPIRATORY_TRACT | Status: DC | PRN
  Administered 2016-05-19 – 2016-05-21 (×3): 3 mL via RESPIRATORY_TRACT
  Filled 2016-05-19 (×3): qty 3

## 2016-05-19 MED ORDER — SODIUM CHLORIDE 0.9% FLUSH: 3.0000 mL | INTRAVENOUS | Status: DC | PRN

## 2016-05-19 MED ORDER — METFORMIN HCL 500 MG PO TABS
1000.0000 mg | ORAL_TABLET | Freq: Two times a day (BID) | ORAL | Status: DC
  Administered 2016-05-19 – 2016-05-22 (×7): 1000 mg via ORAL
  Filled 2016-05-19 (×7): qty 2

## 2016-05-19 MED ORDER — DILTIAZEM HCL ER COATED BEADS 240 MG PO CP24: 240.0000 mg | ORAL_CAPSULE | Freq: Every day | ORAL | Status: DC

## 2016-05-19 MED ORDER — IPRATROPIUM-ALBUTEROL 0.5-2.5 (3) MG/3ML IN SOLN
3.0000 mL | Freq: Once | RESPIRATORY_TRACT | Status: AC
  Administered 2016-05-19: 3 mL via RESPIRATORY_TRACT
  Filled 2016-05-19: qty 3

## 2016-05-19 MED ORDER — SODIUM CHLORIDE 0.9% FLUSH
3.0000 mL | Freq: Two times a day (BID) | INTRAVENOUS | Status: DC
  Administered 2016-05-19 – 2016-05-21 (×4): 3 mL via INTRAVENOUS

## 2016-05-19 MED ORDER — PIOGLITAZONE HCL 30 MG PO TABS
30.0000 mg | ORAL_TABLET | Freq: Every day | ORAL | Status: DC
  Administered 2016-05-19 – 2016-05-22 (×4): 30 mg via ORAL
  Filled 2016-05-19 (×4): qty 1

## 2016-05-19 MED ORDER — FUROSEMIDE 10 MG/ML IJ SOLN: 40.0000 mg | Freq: Every day | INTRAMUSCULAR | Status: DC

## 2016-05-19 MED ORDER — ACETAMINOPHEN 650 MG RE SUPP: 650.0000 mg | Freq: Four times a day (QID) | RECTAL | Status: DC | PRN

## 2016-05-19 MED ORDER — FUROSEMIDE 10 MG/ML IJ SOLN
40.0000 mg | Freq: Once | INTRAMUSCULAR | Status: AC
  Administered 2016-05-19: 40 mg via INTRAVENOUS
  Filled 2016-05-19: qty 4

## 2016-05-19 MED ORDER — CANAGLIFLOZIN 300 MG PO TABS: 300.0000 mg | ORAL_TABLET | Freq: Every day | ORAL | Status: DC

## 2016-05-19 MED ORDER — DILTIAZEM HCL 100 MG IV SOLR
5.0000 mg/h | INTRAVENOUS | Status: DC
  Administered 2016-05-19: 5 mg/h via INTRAVENOUS
  Administered 2016-05-19 (×2): 10 mg/h via INTRAVENOUS
  Filled 2016-05-19: qty 100

## 2016-05-19 MED ORDER — INSULIN LISPRO 100 UNIT/ML (KWIKPEN): 5.0000 [IU] | PEN_INJECTOR | Freq: Every evening | SUBCUTANEOUS | Status: DC

## 2016-05-19 MED ORDER — IRBESARTAN 75 MG PO TABS
37.5000 mg | ORAL_TABLET | Freq: Every day | ORAL | Status: DC
  Administered 2016-05-19 – 2016-05-22 (×4): 37.5 mg via ORAL
  Filled 2016-05-19 (×3): qty 1

## 2016-05-19 MED ORDER — SODIUM CHLORIDE 0.9% FLUSH
3.0000 mL | Freq: Two times a day (BID) | INTRAVENOUS | Status: DC
  Administered 2016-05-19 – 2016-05-22 (×7): 3 mL via INTRAVENOUS

## 2016-05-19 MED ORDER — INSULIN ASPART 100 UNIT/ML ~~LOC~~ SOLN: 0.0000 [IU] | Freq: Every day | SUBCUTANEOUS | Status: DC

## 2016-05-19 MED ORDER — IPRATROPIUM-ALBUTEROL 0.5-2.5 (3) MG/3ML IN SOLN: 3.0000 mL | Freq: Four times a day (QID) | RESPIRATORY_TRACT | Status: DC

## 2016-05-19 MED ORDER — FUROSEMIDE 20 MG PO TABS: 20.0000 mg | ORAL_TABLET | Freq: Every day | ORAL | Status: DC

## 2016-05-19 MED ORDER — INSULIN ASPART 100 UNIT/ML ~~LOC~~ SOLN: 5.0000 [IU] | Freq: Every evening | SUBCUTANEOUS | Status: DC

## 2016-05-19 MED ORDER — AZITHROMYCIN 500 MG PO TABS: 500.0000 mg | ORAL_TABLET | Freq: Every day | ORAL | Status: DC

## 2016-05-19 MED ORDER — ATORVASTATIN CALCIUM 20 MG PO TABS
80.0000 mg | ORAL_TABLET | Freq: Every day | ORAL | Status: DC
  Administered 2016-05-19 – 2016-05-22 (×4): 80 mg via ORAL
  Filled 2016-05-19 (×4): qty 4

## 2016-05-19 MED ORDER — ACETAMINOPHEN 325 MG PO TABS: 650.0000 mg | ORAL_TABLET | Freq: Four times a day (QID) | ORAL | Status: DC | PRN

## 2016-05-19 NOTE — Progress Notes (Addendum)
[facility] NAME: [name]    MR#:  [id number]  DATE OF BIRTH:  01-27-1935  SUBJECTIVE:   Came in with increasing sob and found to have Pneumonia REVIEW OF SYSTEMS:   Review of Systems  Constitutional: Negative for chills, fever and weight loss.  HENT: Negative for ear discharge, ear pain and nosebleeds.   Eyes: Negative for blurred vision, pain and discharge.  Respiratory: Positive for shortness of breath. Negative for sputum production, wheezing and stridor.   Cardiovascular: Positive for palpitations. Negative for chest pain, orthopnea and PND.  Gastrointestinal: Negative for abdominal pain, diarrhea, nausea and vomiting.  Genitourinary: Negative for frequency and urgency.  Musculoskeletal: Negative for back pain and joint pain.  Neurological: Positive for weakness. Negative for sensory change, speech change and focal weakness.  Psychiatric/Behavioral: Negative for depression and hallucinations. The patient is not nervous/anxious.    Tolerating Diet:yes Tolerating PT: pending  DRUG ALLERGIES:   Allergies  Allergen Reactions  . Contrast Media [Iodinated Diagnostic Agents]     SOB  . Iohexol      Desc: Respiratory Distress, Laryngedema, diaphoresis, Onset Date: [id number]   . Latex Rash    VITALS:  Blood pressure 140/82, pulse 96, temperature 98 F (36.7 C), temperature source Oral, resp. rate 18, height 5\' 7"  (1.702 m), weight 97.5 kg (215 lb), SpO2 94 %.  PHYSICAL EXAMINATION:   Physical Exam  GENERAL:  81 y.o.-year-old patient lying in the bed with no acute distress.  EYES: Pupils equal, round, reactive to light and accommodation. No scleral icterus. Extraocular muscles intact.  HEENT: Head atraumatic, normocephalic. Oropharynx and nasopharynx clear.  NECK:  Supple, no jugular venous distention. No thyroid enlargement, no tenderness.  LUNGS: Normal breath sounds bilaterally, no wheezing, rales,  rhonchi. No use of accessory muscles of respiration.  CARDIOVASCULAR: S1, S2 normal. No murmurs, rubs, or gallops.  ABDOMEN: Soft, nontender, nondistended. Bowel sounds present. No organomegaly or mass.  EXTREMITIES: No cyanosis, clubbing or edema b/l.    NEUROLOGIC: Cranial nerves II through XII are intact. No focal Motor or sensory deficits b/l.   PSYCHIATRIC:  patient is alert and oriented x 3.  SKIN: No obvious rash, lesion, or ulcer.   LABORATORY PANEL:  CBC  Recent Labs Lab 05/19/16 0447  WBC 7.6  HGB 14.7  HCT 43.1  PLT 205    Chemistries   Recent Labs Lab 05/18/16 2037 05/19/16 0447  NA 133* 137  K 3.6 3.5  CL 99* 103  CO2 24 25  GLUCOSE 283* 175*  BUN 33* 30*  CREATININE 1.32* 1.26*  CALCIUM 8.7* 8.5*  AST 32  --   ALT 26  --   ALKPHOS 120  --   BILITOT 1.3*  --    Cardiac Enzymes  Recent Labs Lab 05/19/16 0447  TROPONINI <0.03   RADIOLOGY:  Dg Chest 2 View  Result Date: 05/18/2016 CLINICAL DATA:  Acute onset of shortness of breath on exertion. Productive cough. Tachycardia and expiratory wheezing. Initial encounter. EXAM: CHEST  2 VIEW COMPARISON:  Chest radiograph performed 01/19/2013 FINDINGS: The lungs are well-aerated. Mild right basilar airspace opacity could reflect pneumonia, depending on the patient's symptoms. There is no evidence of pleural effusion or pneumothorax. The heart is borderline enlarged. No acute osseous abnormalities are seen. IMPRESSION: Mild right basilar airspace opacity could reflect pneumonia, depending on the patient's symptoms. Borderline cardiomegaly. Electronically Signed   By: Garald Balding M.D.   On: 05/18/2016  21:24   ASSESSMENT AND PLAN:   [name]  is a 80 y.o. male with a known history of Congestive heart failure, prostate cancer, diabetes mellitus type 2, hypertension presented to the emergency room with increased shortness of breath for the last couple of days. Patient does not use any home oxygen and  hasn't been getting more short of breath for the almost a week.complains of orthopnea and has occasional cough  1. Sepsis due to right  Side Community-acquired pneumonia -came in with cough, elevated LA, tachycardia and CXR right side PNA -IV rocephin + zithromax -BC negative -WBC normal, no fever today  2. Atrial fibrillation with rapid rate -on IV Cardizem gtt---wean to off and resume oral CCB and metoprolol(pharmcy to check home dose-med rec not done properly) -cont warfarin  3. Congestive heart failure acute diastolic -echo in June 0000000 NORMAL LEFT VENTRICULAR SYSTOLIC FUNCTION WITH MILD LVH NORMAL RIGHT VENTRICULAR SYSTOLIC FUNCTION MODERATE VALVULAR REGURGITATION (See above) NO VALVULAR STENOSIS MODERATE PHTN EF 55% -IV lasix 40 mg today  4. Hyponatremia -likely due to CHF and PNA  5. Hypertension -resumed home meds  6. Type 2 diabetes mellitus -on home meds and SSI   Case discussed with Care Management/Social Worker. Management plans discussed with the patient, family and they are in agreement.  CODE STATUS: full  DVT Prophylaxis: warfarin  TOTAL TIME TAKING CARE OF THIS PATIENT:30 minutes.  >50% time spent on counselling and coordination of care  POSSIBLE D/C IN 1-2 DAYS, DEPENDING ON CLINICAL CONDITION.  Note: This dictation was prepared with Dragon dictation along with smaller phrase technology. Any transcriptional errors that result from this process are unintentional.  Thanya Cegielski M.D on 05/19/2016 at 8:49 AM  Between 7am to 6pm - Pager - [phone]  After 6pm go to [email or web address] - password [email or web address] [facility]  [facility]  Office  [phone]  CC: Primary care physician; Sherrin Daisy, MD

## 2016-05-19 NOTE — Progress Notes (Signed)
Pt having wheezing. MD notified, orders placed. Will continue to monitor and assess.

## 2016-05-19 NOTE — Progress Notes (Signed)
Pharmacy Antibiotic Note  [name] is a 81 y.o. male admitted on 05/18/2016 with pneumonia.  Pharmacy has been consulted for ceftriaxone dosing.  Plan: Ceftriaxone 1 gram q 24 hours ordered  Height: 5\' 7"  (170.2 cm) Weight: 215 lb (97.5 kg) IBW/kg (Calculated) : 66.1  Temp (24hrs), Avg:97.8 F (36.6 C), Min:97.8 F (36.6 C), Max:97.8 F (36.6 C)   Recent Labs Lab 05/18/16 2037 05/18/16 2102 05/19/16 0020  WBC 8.1  --   --   CREATININE 1.32*  --   --   LATICACIDVEN  --  2.4* 1.5    Estimated Creatinine Clearance: 50.5 mL/min (by C-G formula based on SCr of 1.32 mg/dL (H)).    Allergies  Allergen Reactions  . Contrast Media [Iodinated Diagnostic Agents]     SOB  . Iohexol      Desc: Respiratory Distress, Laryngedema, diaphoresis, Onset Date: [id number]   . Latex Rash    Antimicrobials this admission:  ceftriaxone 12/26 >>  azithromycin 12/26 >>   Dose adjustments this admission:   Microbiology results: 12/26 BCx: pending     12/26 CXR: R base opacity   Thank you for allowing pharmacy to be a part of this patient's care.  Elizabethann Lackey S 05/19/2016 2:58 AM

## 2016-05-19 NOTE — ED Notes (Signed)
Lab called to inquire about BNP results. Lab states they will run it at this time.

## 2016-05-19 NOTE — Progress Notes (Signed)
Pt arrived 0450 via stretcher. Pt A&O with no complaints of pain. IV Caredizem going @ 10 in the left hand. Skin intact. Given room orientation, instructed on how to use call bell, safety sheet signed and bed alarm placed.

## 2016-05-19 NOTE — Progress Notes (Signed)
*  PRELIMINARY RESULTS* Echocardiogram 2D Echocardiogram has been performed.  [name] 05/19/2016, 11:32 AM

## 2016-05-19 NOTE — H&P (Signed)
[facility] [place] [facility]   PATIENT NAME: [name]    MR#:  [id number]  DATE OF BIRTH:  11/26/1934  DATE OF ADMISSION:  05/18/2016  PRIMARY CARE PHYSICIAN: Virk, Charanjit, MD   REQUESTING/REFERRING PHYSICIAN:   CHIEF COMPLAINT:   Chief Complaint  Patient presents with  . Shortness of Breath    HISTORY OF PRESENT ILLNESS: Juliano Lashomb  is a 81 y.o. male with a known history of Congestive heart failure, prostate cancer, diabetes mellitus type 2, hypertension presented to the emergency room with increased shortness of breath for the last couple of days. Patient does not use any home oxygen and hasn't been getting more short of breath for the almost a week.complains of orthopnea and has occasional cough. No history of any fever or chills. Patient says he has been diagnosed with congestive heart failure in the past and is currently compliant with this medication. Uses CPAP at bedtime. No complaints of any chest pain. No history of any headache dizziness or blurry vision. Workup in the emergency room showed elevated bedside lactic level and patient had been given IV antibiotics for pneumonia. Patient also had elevated BNP and was in atrial fibrillation. Started on IV Cardizem drip for rate control. No history of any palpitations.  PAST MEDICAL HISTORY:   Past Medical History:  Diagnosis Date  . Carcinoma of prostate (HCC)   . CHF (congestive heart failure) (HCC)   . Diabetes mellitus without complication (HCC)   . ED (erectile dysfunction)   . Elevated blood pressure   . Frequent urination   . Hypertension   . Prostate cancer (HCC)   . Urinary stress incontinence, male     PAST SURGICAL HISTORY: Past Surgical History:  Procedure Laterality Date  . PROSTATE SURGERY     removal    SOCIAL HISTORY:  Social History  Substance Use Topics  . Smoking status: Former Smoker    Quit date: 10/14/1994  . Smokeless tobacco: Never Used  . Alcohol  use No    FAMILY HISTORY:  Family History  Problem Relation Age of Onset  . Prostate cancer Neg Hx   . Bladder Cancer Neg Hx   . Kidney disease Neg Hx     DRUG ALLERGIES:  Allergies  Allergen Reactions  . Contrast Media [Iodinated Diagnostic Agents]     SOB  . Iohexol      Desc: Respiratory Distress, Laryngedema, diaphoresis, Onset Date: [id number]   . Latex Rash    REVIEW OF SYSTEMS:   CONSTITUTIONAL: No fever, has weakness.  EYES: No blurred or double vision.  EARS, NOSE, AND THROAT: No tinnitus or ear pain.  RESPIRATORY: Has occasional cough, shortness of breath,  No wheezing or hemoptysis.  CARDIOVASCULAR: No chest pain, has orthopnea, edema.  GASTROINTESTINAL: No nausea, vomiting, diarrhea or abdominal pain.  GENITOURINARY: No dysuria, hematuria.  ENDOCRINE: No polyuria, nocturia,  HEMATOLOGY: No anemia, easy bruising or bleeding SKIN: No rash or lesion. MUSCULOSKELETAL: No joint pain or arthritis.   NEUROLOGIC: No tingling, numbness, weakness.  PSYCHIATRY: No anxiety or depression.   MEDICATIONS AT HOME:  Prior to Admission medications   Medication Sig Start Date End Date Taking? Authorizing Provider  amLODipine-benazepril (LOTREL) 5-20 MG per capsule  11/11/14  Yes Historical Provider, MD  atorvastatin (LIPITOR) 80 MG tablet Take 80 mg by mouth daily.   Yes Historical Provider, MD  Blood Glucose Monitoring Suppl (FIFTY50 GLUCOSE METER 2.0) w/Device KIT Use as directed.   Yes Historical Provider,   MD  Blood Glucose Monitoring Suppl (ONE TOUCH ULTRA 2) W/DEVICE KIT Use as directed.   Yes Historical Provider, MD  canagliflozin (INVOKANA) 300 MG TABS tablet Take 300 mg by mouth daily before breakfast.   Yes Historical Provider, MD  CARTIA XT 180 MG 24 hr capsule  11/18/15  Yes Historical Provider, MD  diltiazem (CARDIZEM CD) 240 MG 24 hr capsule Take 240 mg by mouth daily.   Yes Historical Provider, MD  furosemide (LASIX) 20 MG tablet TAKE 1 TABLET DAILY 10/24/15  Yes  Historical Provider, MD  glucose blood (ONE TOUCH ULTRA TEST) test strip Use 2 (two) times daily. Use as instructed. 07/11/14  Yes Historical Provider, MD  HUMALOG KWIKPEN 100 UNIT/ML KiwkPen Inject 5 Units into the skin every evening. 04/20/16  Yes Historical Provider, MD  Lancets 30G MISC Use 1 Device 2 (two) times daily.   Yes Historical Provider, MD  metFORMIN (GLUCOPHAGE) 1000 MG tablet TAKE 1 TABLET TWICE A DAY WITH MEALS 11/28/15  Yes Historical Provider, MD  metoprolol succinate (TOPROL-XL) 100 MG 24 hr tablet Take 100 mg by mouth daily. Take with or immediately following a meal.   Yes Historical Provider, MD  pioglitazone (ACTOS) 30 MG tablet Take 30 mg by mouth daily.   Yes Historical Provider, MD  TRULICITY 1.5 TJ/0.3ES SOPN  12/11/14  Yes Historical Provider, MD  valsartan (DIOVAN) 320 MG tablet TAKE 1 TABLET DAILY 11/28/15  Yes Historical Provider, MD  warfarin (COUMADIN) 5 MG tablet Take 5 mg by mouth daily.   Yes Historical Provider, MD  clobetasol ointment (TEMOVATE) 0.05 %  11/12/14   Historical Provider, MD      PHYSICAL EXAMINATION:   VITAL SIGNS: Blood pressure (!) 134/93, pulse 95, temperature 97.8 F (36.6 C), temperature source Oral, resp. rate (!) 24, height 5' 7" (1.702 m), weight 97.5 kg (215 lb), SpO2 91 %.  GENERAL:  81 y.o.-year-old patient lying in the bed in mild respiratory distress.  EYES: Pupils equal, round, reactive to light and accommodation. No scleral icterus. Extraocular muscles intact.  HEENT: Head atraumatic, normocephalic. Oropharynx and nasopharynx clear.  NECK:  Supple, no jugular venous distention. No thyroid enlargement, no tenderness.  LUNGS: Decreased breath sounds bilaterally, scattered rales in right lung. No use of accessory muscles of respiration.  CARDIOVASCULAR: S1, S2 irregular. No murmurs, rubs, or gallops.  ABDOMEN: Soft, nontender, nondistended. Bowel sounds present. No organomegaly or mass.  EXTREMITIES: has pedal edema, no cyanosis, or  clubbing.  NEUROLOGIC: Cranial nerves II through XII are intact. Muscle strength 5/5 in all extremities. Sensation intact. Gait not checked.  PSYCHIATRIC: The patient is alert and oriented x 3.  SKIN: No obvious rash, lesion, or ulcer.   LABORATORY PANEL:   CBC  Recent Labs Lab 05/18/16 2037  WBC 8.1  HGB 16.0  HCT 46.9  PLT 181  MCV 87.4  MCH 29.7  MCHC 34.0  RDW 15.2*  LYMPHSABS 1.3  MONOABS 1.0  EOSABS 0.5  BASOSABS 0.1   ------------------------------------------------------------------------------------------------------------------  Chemistries   Recent Labs Lab 05/18/16 2037  NA 133*  K 3.6  CL 99*  CO2 24  GLUCOSE 283*  BUN 33*  CREATININE 1.32*  CALCIUM 8.7*  AST 32  ALT 26  ALKPHOS 120  BILITOT 1.3*   ------------------------------------------------------------------------------------------------------------------ estimated creatinine clearance is 50.5 mL/min (by C-G formula based on SCr of 1.32 mg/dL (H)). ------------------------------------------------------------------------------------------------------------------ No results for input(s): TSH, T4TOTAL, T3FREE, THYROIDAB in the last 72 hours.  Invalid input(s): FREET3   Coagulation profile  No results for input(s): INR, PROTIME in the last 168 hours. ------------------------------------------------------------------------------------------------------------------- No results for input(s): DDIMER in the last 72 hours. -------------------------------------------------------------------------------------------------------------------  Cardiac Enzymes  Recent Labs Lab 05/18/16 2037  TROPONINI <0.03   ------------------------------------------------------------------------------------------------------------------ Invalid input(s): POCBNP  ---------------------------------------------------------------------------------------------------------------  Urinalysis No results found for:  COLORURINE, APPEARANCEUR, LABSPEC, PHURINE, GLUCOSEU, HGBUR, BILIRUBINUR, KETONESUR, PROTEINUR, UROBILINOGEN, NITRITE, LEUKOCYTESUR   RADIOLOGY: Dg Chest 2 View  Result Date: 05/18/2016 CLINICAL DATA:  Acute onset of shortness of breath on exertion. Productive cough. Tachycardia and expiratory wheezing. Initial encounter. EXAM: CHEST  2 VIEW COMPARISON:  Chest radiograph performed 01/19/2013 FINDINGS: The lungs are well-aerated. Mild right basilar airspace opacity could reflect pneumonia, depending on the patient's symptoms. There is no evidence of pleural effusion or pneumothorax. The heart is borderline enlarged. No acute osseous abnormalities are seen. IMPRESSION: Mild right basilar airspace opacity could reflect pneumonia, depending on the patient's symptoms. Borderline cardiomegaly. Electronically Signed   By: Garald Balding M.D.   On: 05/18/2016 21:24    EKG: Orders placed or performed during the hospital encounter of 05/18/16  . ED EKG  . ED EKG  . EKG 12-Lead  . EKG 12-Lead    IMPRESSION AND PLAN: 81 year old male patient with history of congestive heart failure, diabetes metastatic 2, prostate cancer, hypertension presented to the emergency room with increased shortness of breath and cough. Admitting diagnosis 1. Community-acquired pneumonia 2. Atrial fibrillation with rapid rate 3. Congestive heart failure 4. Hyponatremia 5. Hypertension 6. Type 2 diabetes mellitus Treatment plan Admit patient to stepdown unit inpatient service IV Cardizem drip for rate control Start patient on IV Rocephin and IV Zithromax antibiotics Diurese patient with IV Lasix Follow-up electrolytes Resume Coumadin for anticoagulation Check echocardiogram.  All the records are reviewed and case discussed with ED provider. Management plans discussed with the patient, family and they are in agreement.  CODE STATUS:FULL CODE Code Status History    This patient does not have a recorded code status.  Please follow your organizational policy for patients in this situation.       TOTAL TIME TAKING CARE OF THIS PATIENT: 55 minutes.    Saundra Shelling M.D on 05/19/2016 at 2:40 AM  Between 7am to 6pm - Pager - [phone]  After 6pm go to [email or web address] - password [email or web address]  [facility]  Office  [phone]  CC: Primary care physician; Sherrin Daisy, MD

## 2016-05-19 NOTE — Progress Notes (Signed)
PHARMACIST - PHYSICIAN COMMUNICATION DR:   [name] CONCERNING: Antibiotic IV to Oral Route Change Policy  RECOMMENDATION: This patient is receiving azithromycin by the intravenous route.  Based on criteria approved by the Pharmacy and Therapeutics Committee, the antibiotic(s) is/are being converted to the equivalent oral dose form(s).   DESCRIPTION: These criteria include:  Patient being treated for a respiratory tract infection, urinary tract infection, cellulitis or clostridium difficile associated diarrhea if on metronidazole  The patient is not neutropenic and does not exhibit a GI malabsorption state  The patient is eating (either orally or via tube) and/or has been taking other orally administered medications for a least 24 hours  The patient is improving clinically and has a Tmax < 100.5  If you have questions about this conversion, please contact the Pharmacy Department  []   [phone] )  [name] [x]   [phone] )  [name] []   [phone] )  [name] []   [phone] )  [name] []   [phone] )  [name], PharmD Clinical Pharmacist

## 2016-05-20 LAB — PROTIME-INR
INR: 1.64
Prothrombin Time: 19.6 seconds — ABNORMAL HIGH (ref 11.4–15.2)

## 2016-05-20 LAB — GLUCOSE, CAPILLARY
GLUCOSE-CAPILLARY: 200 mg/dL — AB (ref 65–99)
Glucose-Capillary: 177 mg/dL — ABNORMAL HIGH (ref 65–99)
Glucose-Capillary: 153 mg/dL — ABNORMAL HIGH (ref 65–99)
Glucose-Capillary: 125 mg/dL — ABNORMAL HIGH (ref 65–99)

## 2016-05-20 MED ORDER — LEVOFLOXACIN 500 MG PO TABS
500.0000 mg | ORAL_TABLET | Freq: Every day | ORAL | Status: DC
  Administered 2016-05-20 – 2016-05-22 (×3): 500 mg via ORAL
  Filled 2016-05-20 (×3): qty 1

## 2016-05-20 MED ORDER — DILTIAZEM HCL ER COATED BEADS 240 MG PO CP24
240.0000 mg | ORAL_CAPSULE | Freq: Every day | ORAL | Status: DC
  Administered 2016-05-21 – 2016-05-22 (×2): 240 mg via ORAL
  Filled 2016-05-20 (×2): qty 1

## 2016-05-20 MED ORDER — DILTIAZEM HCL 60 MG PO TABS
60.0000 mg | ORAL_TABLET | Freq: Once | ORAL | Status: AC
  Administered 2016-05-20: 60 mg via ORAL
  Filled 2016-05-20: qty 1

## 2016-05-20 MED ORDER — FUROSEMIDE 20 MG PO TABS
20.0000 mg | ORAL_TABLET | Freq: Every day | ORAL | Status: DC
  Administered 2016-05-20 – 2016-05-22 (×3): 20 mg via ORAL
  Filled 2016-05-20 (×3): qty 1

## 2016-05-20 NOTE — Progress Notes (Signed)
PT Cancellation Note  Patient Details Name: [name] MRN: [id number] DOB: 03-15-1935   Cancelled Treatment:    Reason Eval/Treat Not Completed: Patient not medically ready.  Pt's HR checked on telemetry screen outside of pt's room and pt's HR noted to be fluctuating between 108-142 bpm.  Discussed this with nursing who reported pt recently medicated.  Will hold PT at this time d/t HR concerns and will re-attempt PT eval at a later date/time as medically appropriate.   [name] 05/20/2016, 11:06 AM Leitha Bleak, [place]

## 2016-05-20 NOTE — Progress Notes (Signed)
ANTICOAGULATION CONSULT NOTE - Initial Consult  Pharmacy Consult for Warfarin Indication: atrial fibrillation  Allergies  Allergen Reactions  . Contrast Media [Iodinated Diagnostic Agents]     SOB  . Iohexol      Desc: Respiratory Distress, Laryngedema, diaphoresis, Onset Date: [id number]   . Latex Rash    Patient Measurements: Height: '5\' 7"'$  (170.2 cm) Weight: 215 lb (97.5 kg) IBW/kg (Calculated) : 66.1  Vital Signs: Temp: 97.8 F (36.6 C) (12/28 1049) Temp Source: Oral (12/28 1049) BP: 130/74 (12/28 1049) Pulse Rate: 93 (12/28 1049)  Labs:  Recent Labs  05/18/16 2037 05/19/16 0447 05/19/16 1100 05/19/16 1713 05/20/16 1406  HGB 16.0 14.7  --   --   --   HCT 46.9 43.1  --   --   --   PLT 181 205  --   --   --   LABPROT  --  19.2*  --   --  19.6*  INR  --  1.60  --   --  1.64  CREATININE 1.32* 1.26*  --   --   --   TROPONINI <0.03 <0.03 <0.03 <0.03  --     Estimated Creatinine Clearance: 52.9 mL/min (by C-G formula based on SCr of 1.26 mg/dL (H)).   Medical History: Past Medical History:  Diagnosis Date  . Carcinoma of prostate ([place])   . CHF (congestive heart failure) ([place])   . Diabetes mellitus without complication ([place])   . ED (erectile dysfunction)   . Elevated blood pressure   . Frequent urination   . Hypertension   . Prostate cancer ([place])   . Urinary stress incontinence, male     Medications:  Prescriptions Prior to Admission  Medication Sig Dispense Refill Last Dose  . amLODipine-benazepril (LOTREL) 5-20 MG per capsule    Taking  . atorvastatin (LIPITOR) 80 MG tablet Take 80 mg by mouth daily.   Taking  . Blood Glucose Monitoring Suppl (FIFTY50 GLUCOSE METER 2.0) w/Device KIT Use as directed.   Taking  . Blood Glucose Monitoring Suppl (ONE TOUCH ULTRA 2) W/DEVICE KIT Use as directed.   Taking  . canagliflozin (INVOKANA) 300 MG TABS tablet Take 300 mg by mouth daily before breakfast.   Taking  . CARTIA XT 180 MG 24 hr capsule Take by mouth daily.     Taking  . furosemide (LASIX) 20 MG tablet TAKE 1 TABLET DAILY   Taking  . glucose blood (ONE TOUCH ULTRA TEST) test strip Use 2 (two) times daily. Use as instructed.   Taking  . HUMALOG KWIKPEN 100 UNIT/ML KiwkPen Inject 5 Units into the skin every evening.     . Lancets 30G MISC Use 1 Device 2 (two) times daily.   Taking  . metFORMIN (GLUCOPHAGE) 1000 MG tablet TAKE 1 TABLET TWICE A DAY WITH MEALS   Taking  . metoprolol succinate (TOPROL-XL) 100 MG 24 hr tablet Take 100 mg by mouth daily. Take with or immediately following a meal.   Taking  . pioglitazone (ACTOS) 30 MG tablet Take 30 mg by mouth daily.   Taking  . TRULICITY 1.5 VC/9.4WH SOPN    Taking  . valsartan (DIOVAN) 320 MG tablet TAKE 1 TABLET DAILY   Taking  . warfarin (COUMADIN) 5 MG tablet Take 5 mg by mouth daily.   Taking  . clobetasol ointment (TEMOVATE) 0.05 %    Taking    Assessment: 81 y/o M with a h/o AF on warfarin admitted with sepsis due to PNA. Patient is  receiving abx that may potentiate INR.   Goal of Therapy:  INR 2-3   Plan:  INR subtherapeutic on admission. Will continue warfarin 5 mg daily and f/u AM INR.   Ulice Dash D 05/20/2016,2:04 PM

## 2016-05-20 NOTE — Care Management (Signed)
Patient presented to the ED from home with gradual increase in shortness of breath.  Admitted with pneumonia and atrial fib with rvr. Does have history of heart failure and atrial fib.  Required cardizem infusion which has been discontinued.  His rate continues to be elevated at times and has required change in cardiac meds. PT consult is pending due to elevated heart rate.  Prior to this admission, Independent in all adls, denies issues accessing medical care, obtaining medications or with transportation.  Current with PCP.  Discussed the need for home 02 assessment during progression

## 2016-05-20 NOTE — Progress Notes (Signed)
Cardizem 180mg  24h capsule given per MD order due to increasing heart rate in the 140-150's. Will continue to monitor

## 2016-05-20 NOTE — Evaluation (Signed)
Physical Therapy Evaluation Patient Details Name: [name] MRN: [id number] DOB: 10-13-1934 Today's Date: 05/20/2016   History of Present Illness  Patient is a 81 y/o male that presents with increased shortness of breath, pneumonia and variable high HR (has been going into a-fib).   Clinical Impression  Patient evaluated for recent onset shortness of breath and a-fib/variable HR while on this admission. He has had multiple changes in cardiac medications recently, noted to have resting HR in the 90-100s. With ambulation his HR increases to 130s-140s with very short distance ambulation. He also wears O2 in this session, though no shortness of breath or any cardiopulmonary symptoms reported by patient. It appears he has the physical strength to return home, but will be quite limited with mobility until HR is not as elevated with short distance mobility.     Follow Up Recommendations Home health PT (Cardiopulmonary rehab)    Equipment Recommendations       Recommendations for Other Services       Precautions / Restrictions Precautions Precautions: Fall Restrictions Weight Bearing Restrictions: No      Mobility  Bed Mobility Overal bed mobility: Needs Assistance Bed Mobility: Supine to Sit     Supine to sit: Min assist;Mod assist     General bed mobility comments: Patient requires assistance to bring chest off bed surface, able to negotiate legs.   Transfers Overall transfer level: Needs assistance   Transfers: Sit to/from Stand Sit to Stand: Supervision         General transfer comment: Patient demonstrates no loss of balance during transfer. Appropriate speed.   Ambulation/Gait Ambulation/Gait assistance: Supervision Ambulation Distance (Feet): 175 Feet   Gait Pattern/deviations: WFL(Within Functional Limits)   Gait velocity interpretation: Below normal speed for age/gender General Gait Details: Of note patient had HR increase into 140s during gait, even  during short bouts of activity (25'). He was asymptomatic during each episode of elevated HR.   Stairs            Wheelchair Mobility    Modified Rankin (Stroke Patients Only)       Balance Overall balance assessment: Needs assistance Sitting-balance support: No upper extremity supported Sitting balance-Leahy Scale: Normal     Standing balance support: No upper extremity supported Standing balance-Leahy Scale: Good                               Pertinent Vitals/Pain Pain Assessment: No/denies pain    Home Living Family/patient expects to be discharged to:: Private residence Living Arrangements: Spouse/significant other Available Help at Discharge: Family;Available 24 hours/day Type of Home: House Home Access: Level entry     Home Layout: One level Home Equipment: None      Prior Function Level of Independence: Independent         Comments: Patient denies any falls recently, has been independent prior to this admission.      Hand Dominance        Extremity/Trunk Assessment   Upper Extremity Assessment Upper Extremity Assessment: Overall WFL for tasks assessed    Lower Extremity Assessment Lower Extremity Assessment: Overall WFL for tasks assessed       Communication   Communication: No difficulties  Cognition Arousal/Alertness: Awake/alert Behavior During Therapy: WFL for tasks assessed/performed Overall Cognitive Status: Within Functional Limits for tasks assessed  General Comments      Exercises     Assessment/Plan    PT Assessment Patient needs continued PT services  PT Problem List Decreased strength;Decreased balance;Decreased activity tolerance;Cardiopulmonary status limiting activity          PT Treatment Interventions DME instruction;Gait training;Stair training;Therapeutic exercise;Therapeutic activities;Patient/family education    PT Goals (Current goals can be found in the Care  Plan section)  Acute Rehab PT Goals Patient Stated Goal: To increase his stamina PT Goal Formulation: With patient Time For Goal Achievement: 06/03/16 Potential to Achieve Goals: Good    Frequency Min 2X/week   Barriers to discharge        Co-evaluation               End of Session Equipment Utilized During Treatment: Gait belt;Oxygen Activity Tolerance: Patient tolerated treatment well Patient left: in chair;with call bell/phone within reach;with chair alarm set Nurse Communication: Mobility status         Time: [id number] PT Time Calculation (min) (ACUTE ONLY): 12 min   Charges:   PT Evaluation $PT Eval Moderate Complexity: 1 Procedure     PT G Codes:       Kerman Passey, PT, DPT    05/20/2016, 6:12 PM

## 2016-05-20 NOTE — Progress Notes (Signed)
[facility] at [place] NAME: [name]    MR#:  [id number]  DATE OF BIRTH:  [birth year]  SUBJECTIVE:   Came in with increasing sob and found to have Pneumonia HR intermittently in the 140's last nite. Pt out int he chair REVIEW OF SYSTEMS:   Review of Systems  Constitutional: Negative for chills, fever and weight loss.  HENT: Negative for ear discharge, ear pain and nosebleeds.   Eyes: Negative for blurred vision, pain and discharge.  Respiratory: Positive for shortness of breath. Negative for sputum production, wheezing and stridor.   Cardiovascular: Positive for palpitations. Negative for chest pain, orthopnea and PND.  Gastrointestinal: Negative for abdominal pain, diarrhea, nausea and vomiting.  Genitourinary: Negative for frequency and urgency.  Musculoskeletal: Negative for back pain and joint pain.  Neurological: Positive for weakness. Negative for sensory change, speech change and focal weakness.  Psychiatric/Behavioral: Negative for depression and hallucinations. The patient is not nervous/anxious.    Tolerating Diet:yes Tolerating PT: pending  DRUG ALLERGIES:   Allergies  Allergen Reactions  . Contrast Media [Iodinated Diagnostic Agents]     SOB  . Iohexol      Desc: Respiratory Distress, Laryngedema, diaphoresis, Onset Date: [id number]   . Latex Rash    VITALS:  Blood pressure 130/74, pulse 93, temperature 97.8 F (36.6 C), temperature source Oral, resp. rate 18, height 5\' 7"  (1.702 m), weight 97.5 kg (215 lb), SpO2 96 %.  PHYSICAL EXAMINATION:   Physical Exam  GENERAL:  81 y.o.-year-old patient lying in the bed with no acute distress.  EYES: Pupils equal, round, reactive to light and accommodation. No scleral icterus. Extraocular muscles intact.  HEENT: Head atraumatic, normocephalic. Oropharynx and nasopharynx clear.  NECK:  Supple, no jugular venous distention. No thyroid enlargement, no tenderness.   LUNGS: Normal breath sounds bilaterally, no wheezing, rales, rhonchi. No use of accessory muscles of respiration.  CARDIOVASCULAR: S1, S2 normal. No murmurs, rubs, or gallops.  ABDOMEN: Soft, nontender, nondistended. Bowel sounds present. No organomegaly or mass.  EXTREMITIES: No cyanosis, clubbing or edema b/l.    NEUROLOGIC: Cranial nerves II through XII are intact. No focal Motor or sensory deficits b/l.   PSYCHIATRIC:  patient is alert and oriented x 3.  SKIN: No obvious rash, lesion, or ulcer.   LABORATORY PANEL:  CBC  Recent Labs Lab 05/19/16 0447  WBC 7.6  HGB 14.7  HCT 43.1  PLT 205    Chemistries   Recent Labs Lab 05/18/16 2037 05/19/16 0447  NA 133* 137  K 3.6 3.5  CL 99* 103  CO2 24 25  GLUCOSE 283* 175*  BUN 33* 30*  CREATININE 1.32* 1.26*  CALCIUM 8.7* 8.5*  AST 32  --   ALT 26  --   ALKPHOS 120  --   BILITOT 1.3*  --    Cardiac Enzymes  Recent Labs Lab 05/19/16 1713  TROPONINI <0.03   RADIOLOGY:  Dg Chest 2 View  Result Date: 05/18/2016 CLINICAL DATA:  Acute onset of shortness of breath on exertion. Productive cough. Tachycardia and expiratory wheezing. Initial encounter. EXAM: CHEST  2 VIEW COMPARISON:  Chest radiograph performed 01/19/2013 FINDINGS: The lungs are well-aerated. Mild right basilar airspace opacity could reflect pneumonia, depending on the patient's symptoms. There is no evidence of pleural effusion or pneumothorax. The heart is borderline enlarged. No acute osseous abnormalities are seen. IMPRESSION: Mild right basilar airspace opacity could reflect pneumonia, depending on the patient's symptoms. Borderline cardiomegaly. Electronically  Signed   By: Garald Balding M.D.   On: 05/18/2016 21:24   ASSESSMENT AND PLAN:   [name]  is a 81 y.o. male with a known history of Congestive heart failure, prostate cancer, diabetes mellitus type 2, hypertension presented to the emergency room with increased shortness of breath for the last  couple of days. Patient does not use any home oxygen and hasn't been getting more short of breath for the almost a week.complains of orthopnea and has occasional cough  1. Sepsis due to right  Side Community-acquired pneumonia -came in with cough, elevated LA, tachycardia and CXR right side PNA -IV rocephin + zithromax -BC negative -WBC normal, no fever today  2. Atrial fibrillation with rapid rate -on IV Cardizem gtt---wean to off and resume oral CCB and metoprolol( -cont warfarin -increased CCB to 240 mg daily and cont Toprol XL100 mg daily -HR much better now in the 80's  3. Congestive heart failure acute diastolic -echo in June 0000000 NORMAL LEFT VENTRICULAR SYSTOLIC FUNCTION WITH MILD LVH NORMAL RIGHT VENTRICULAR SYSTOLIC FUNCTION MODERATE VALVULAR REGURGITATION (See above) NO VALVULAR STENOSIS MODERATE PHTN EF 55% -IV lasix 40 mg today---now on home dose 20 mg daily  4. Hyponatremia -likely due to CHF and PNA  5. Hypertension -resumed home meds  6. Type 2 diabetes mellitus -on home meds and SSI  PT to see pt  Case discussed with Care Management/Social Worker. Management plans discussed with the patient, family and they are in agreement.  CODE STATUS: full  DVT Prophylaxis: warfarin  TOTAL TIME TAKING CARE OF THIS PATIENT:30 minutes.  >50% time spent on counselling and coordination of care  POSSIBLE D/C IN 1-2 DAYS, DEPENDING ON CLINICAL CONDITION.  Note: This dictation was prepared with Dragon dictation along with smaller phrase technology. Any transcriptional errors that result from this process are unintentional.  Arend Bahl M.D on 05/20/2016 at 3:24 PM  Between 7am to 6pm - Pager - [phone]  After 6pm go to [email or web address] - password [email or web address] [facility]  [facility]  Office  [phone]  CC: Primary care physician; Sherrin Daisy, MD

## 2016-05-21 LAB — GLUCOSE, CAPILLARY
Glucose-Capillary: 144 mg/dL — ABNORMAL HIGH (ref 65–99)
Glucose-Capillary: 160 mg/dL — ABNORMAL HIGH (ref 65–99)
Glucose-Capillary: 126 mg/dL — ABNORMAL HIGH (ref 65–99)
Glucose-Capillary: 138 mg/dL — ABNORMAL HIGH (ref 65–99)
Glucose-Capillary: 114 mg/dL — ABNORMAL HIGH (ref 65–99)

## 2016-05-21 LAB — PROTIME-INR
INR: 1.89
PROTHROMBIN TIME: 22 s — AB (ref 11.4–15.2)

## 2016-05-21 LAB — PROCALCITONIN

## 2016-05-21 MED ORDER — LABETALOL HCL 5 MG/ML IV SOLN
5.0000 mg | INTRAVENOUS | Status: DC | PRN
  Administered 2016-05-21 (×2): 5 mg via INTRAVENOUS
  Administered 2016-05-21: 10 mg via INTRAVENOUS
  Filled 2016-05-21 (×3): qty 4

## 2016-05-21 MED ORDER — DILTIAZEM HCL 60 MG PO TABS
60.0000 mg | ORAL_TABLET | Freq: Three times a day (TID) | ORAL | Status: DC | PRN
  Administered 2016-05-22: 60 mg via ORAL
  Filled 2016-05-21: qty 1

## 2016-05-21 NOTE — Progress Notes (Signed)
No issues noted so far. Patient care transferred to [facility]

## 2016-05-21 NOTE — Progress Notes (Signed)
ANTICOAGULATION CONSULT NOTE - Initial Consult  Pharmacy Consult for Warfarin Indication: atrial fibrillation  Allergies  Allergen Reactions  . Contrast Media [Iodinated Diagnostic Agents]     SOB  . Iohexol      Desc: Respiratory Distress, Laryngedema, diaphoresis, Onset Date: [id number]   . Latex Rash    Patient Measurements: Height: '5\' 7"'  (170.2 cm) Weight: 210 lb 12.8 oz (95.6 kg) IBW/kg (Calculated) : 66.1  Vital Signs: Temp: 98.3 F (36.8 C) (12/29 0801) Temp Source: Oral (12/29 0801) BP: 143/99 (12/29 0801) Pulse Rate: 116 (12/29 0801)  Labs:  Recent Labs  05/18/16 2037 05/19/16 0447 05/19/16 1100 05/19/16 1713 05/20/16 1406 05/21/16 0441  HGB 16.0 14.7  --   --   --   --   HCT 46.9 43.1  --   --   --   --   PLT 181 205  --   --   --   --   LABPROT  --  19.2*  --   --  19.6* 22.0*  INR  --  1.60  --   --  1.64 1.89  CREATININE 1.32* 1.26*  --   --   --   --   TROPONINI <0.03 <0.03 <0.03 <0.03  --   --     Estimated Creatinine Clearance: 52.4 mL/min (by C-G formula based on SCr of 1.26 mg/dL (H)).   Medical History: Past Medical History:  Diagnosis Date  . Carcinoma of prostate ([place])   . CHF (congestive heart failure) ([place])   . Diabetes mellitus without complication ([place])   . ED (erectile dysfunction)   . Elevated blood pressure   . Frequent urination   . Hypertension   . Prostate cancer ([place])   . Urinary stress incontinence, male     Medications:  Prescriptions Prior to Admission  Medication Sig Dispense Refill Last Dose  . amLODipine-benazepril (LOTREL) 5-20 MG per capsule    Taking  . atorvastatin (LIPITOR) 80 MG tablet Take 80 mg by mouth daily.   Taking  . Blood Glucose Monitoring Suppl (FIFTY50 GLUCOSE METER 2.0) w/Device KIT Use as directed.   Taking  . Blood Glucose Monitoring Suppl (ONE TOUCH ULTRA 2) W/DEVICE KIT Use as directed.   Taking  . canagliflozin (INVOKANA) 300 MG TABS tablet Take 300 mg by mouth daily before breakfast.    Taking  . CARTIA XT 180 MG 24 hr capsule Take by mouth daily.    Taking  . furosemide (LASIX) 20 MG tablet TAKE 1 TABLET DAILY   Taking  . glucose blood (ONE TOUCH ULTRA TEST) test strip Use 2 (two) times daily. Use as instructed.   Taking  . HUMALOG KWIKPEN 100 UNIT/ML KiwkPen Inject 5 Units into the skin every evening.     . Lancets 30G MISC Use 1 Device 2 (two) times daily.   Taking  . metFORMIN (GLUCOPHAGE) 1000 MG tablet TAKE 1 TABLET TWICE A DAY WITH MEALS   Taking  . metoprolol succinate (TOPROL-XL) 100 MG 24 hr tablet Take 100 mg by mouth daily. Take with or immediately following a meal.   Taking  . pioglitazone (ACTOS) 30 MG tablet Take 30 mg by mouth daily.   Taking  . TRULICITY 1.5 AJ/6.8TL SOPN    Taking  . valsartan (DIOVAN) 320 MG tablet TAKE 1 TABLET DAILY   Taking  . warfarin (COUMADIN) 5 MG tablet Take 5 mg by mouth daily.   Taking  . clobetasol ointment (TEMOVATE) 0.05 %    Taking  Assessment: 81 y/o M with a h/o AF on warfarin admitted with sepsis due to PNA. Patient is receiving abx that may potentiate INR.   Goal of Therapy:  INR 2-3   Plan:  Continue warfarin 5 mg daily and f/u AM INR.   Ulice Dash D 05/21/2016,10:17 AM

## 2016-05-21 NOTE — Care Management (Signed)
Unable to perform entire home 02 assessment due to unstable heart rate with minimal activity. Patient is agreeable for home health nurse and physical therapy.  Agency preference is Nurse, learning disability. Heads up referral to [facility]

## 2016-05-21 NOTE — Progress Notes (Signed)
[name] at [place] NAME: [name]    MR#:  [id number]  DATE OF BIRTH:  [birth year]  SUBJECTIVE:   Came in with increasing sob and found to have Pneumonia HR intermittently in the 140's last nite. Patient remains again in A. fib with rapid heart rate. REVIEW OF SYSTEMS:   Review of Systems  Constitutional: Negative for chills, fever and weight loss.  HENT: Negative for ear discharge, ear pain and nosebleeds.   Eyes: Negative for blurred vision, pain and discharge.  Respiratory: Positive for shortness of breath. Negative for sputum production, wheezing and stridor.   Cardiovascular: Positive for palpitations. Negative for chest pain, orthopnea and PND.  Gastrointestinal: Negative for abdominal pain, diarrhea, nausea and vomiting.  Genitourinary: Negative for frequency and urgency.  Musculoskeletal: Negative for back pain and joint pain.  Neurological: Positive for weakness. Negative for sensory change, speech change and focal weakness.  Psychiatric/Behavioral: Negative for depression and hallucinations. The patient is not nervous/anxious.    Tolerating Diet:yes Tolerating PT: Home health PT  DRUG ALLERGIES:   Allergies  Allergen Reactions  . Contrast Media [Iodinated Diagnostic Agents]     SOB  . Iohexol      Desc: Respiratory Distress, Laryngedema, diaphoresis, Onset Date: [id number]   . Latex Rash    VITALS:  Blood pressure 112/68, pulse (!) 112, temperature 98.3 F (36.8 C), temperature source Oral, resp. rate 20, height 5\' 7"  (1.702 m), weight 95.6 kg (210 lb 12.8 oz), SpO2 94 %.  PHYSICAL EXAMINATION:   Physical Exam  GENERAL:  81 y.o.-year-old patient lying in the bed with no acute distress.  EYES: Pupils equal, round, reactive to light and accommodation. No scleral icterus. Extraocular muscles intact.  HEENT: Head atraumatic, normocephalic. Oropharynx and nasopharynx clear.  NECK:  Supple, no jugular venous  distention. No thyroid enlargement, no tenderness.  LUNGS: Normal breath sounds bilaterally, no wheezing, rales, rhonchi. No use of accessory muscles of respiration.  CARDIOVASCULAR: S1, S2 normal. No murmurs, rubs, or gallops.  ABDOMEN: Soft, nontender, nondistended. Bowel sounds present. No organomegaly or mass.  EXTREMITIES: No cyanosis, clubbing or edema b/l.    NEUROLOGIC: Cranial nerves II through XII are intact. No focal Motor or sensory deficits b/l.   PSYCHIATRIC:  patient is alert and oriented x 3.  SKIN: No obvious rash, lesion, or ulcer.   LABORATORY PANEL:  CBC  Recent Labs Lab 05/19/16 0447  WBC 7.6  HGB 14.7  HCT 43.1  PLT 205    Chemistries   Recent Labs Lab 05/18/16 2037 05/19/16 0447  NA 133* 137  K 3.6 3.5  CL 99* 103  CO2 24 25  GLUCOSE 283* 175*  BUN 33* 30*  CREATININE 1.32* 1.26*  CALCIUM 8.7* 8.5*  AST 32  --   ALT 26  --   ALKPHOS 120  --   BILITOT 1.3*  --    Cardiac Enzymes  Recent Labs Lab 05/19/16 1713  TROPONINI <0.03   RADIOLOGY:  No results found. ASSESSMENT AND PLAN:   [name]  is a 81 y.o. male with a known history of Congestive heart failure, prostate cancer, diabetes mellitus type 2, hypertension presented to the emergency room with increased shortness of breath for the last couple of days. Patient does not use any home oxygen and hasn't been getting more short of breath for the almost a week.complains of orthopnea and has occasional cough  1. Sepsis due to right  Side Community-acquired pneumonia -  came in with cough, elevated LA, tachycardia and CXR right side PNA -IV rocephin + zithromax---changed to by mouth Levaquin -BC negative -WBC normal, no fever today  2. Atrial fibrillation with rapid rate -on IV Cardizem gtt---wean to off and resume oral CCB and metoprolol( -cont warfarin -increased CCB to 240 mg daily and cont Toprol XL100 mg daily---oral Cardizem 60 mg 3 times a day added by cardiology Dr. Ubaldo Glassing  3.  Congestive heart failure acute diastolic -echo in June 0000000 NORMAL LEFT VENTRICULAR SYSTOLIC FUNCTION WITH MILD LVH NORMAL RIGHT VENTRICULAR SYSTOLIC FUNCTION MODERATE VALVULAR REGURGITATION (See above) NO VALVULAR STENOSIS MODERATE PHTN EF 55% -IV lasix 40 mg x1---now on home dose 20 mg daily  4. Hyponatremia -likely due to CHF and PNA  5. Hypertension -resumed home meds  6. Type 2 diabetes mellitus -on home meds and SSI  PT to see pt  Case discussed with Care Management/Social Worker. Management plans discussed with the patient, family and they are in agreement.  CODE STATUS: full  DVT Prophylaxis: warfarin  TOTAL TIME TAKING CARE OF THIS PATIENT:30 minutes.  >50% time spent on counselling and coordination of care  POSSIBLE D/C IN 1-2 DAYS, DEPENDING ON CLINICAL CONDITION.  Note: This dictation was prepared with Dragon dictation along with smaller phrase technology. Any transcriptional errors that result from this process are unintentional.  [name] M.D on 05/21/2016 at 2:13 PM  Between 7am to 6pm - Pager - [phone]  After 6pm go to [email or web address] - password [email or web address]  [facility]  Office  [phone]  CC: Primary care physician; Sherrin Daisy, MD

## 2016-05-21 NOTE — Consult Note (Signed)
[place]  CARDIOLOGY CONSULT NOTE  Patient ID: [name] MRN: [id number] DOB/AGE: [birth year] 81 y.o.  Admit date: 05/18/2016 Referring Physician Dr. [name] Mandes Primary Physician   Primary Cardiologist Dr. Nehemiah Massed Reason for Consultation afib with rvr  HPI: Pt is a 81 yo male with history of chronic afib treated with rate control with cardizem and anticoagulation with warfarin, history of type 2 diabetes mellitus, moderate tricuspid insuffiency and hyperlipidemia who was admitted with sob and diagnosis of probable right sided pneumonia and was noted to have afib with rvr.  He was continued with warfarin and caardizem. His rate has been somewhat difficult to control. He denies chest pain or shortness of breath at present. He denies syncope or presyncope.  Review of Systems  HENT: Negative.   Eyes: Negative.   Respiratory: Positive for cough and shortness of breath.   Cardiovascular: Negative.   Gastrointestinal: Negative.   Genitourinary: Negative.   Musculoskeletal: Negative.   Skin: Negative.   Neurological: Positive for weakness.  Endo/Heme/Allergies: Negative.   Psychiatric/Behavioral: Negative.     Past Medical History:  Diagnosis Date  . Carcinoma of prostate ([place])   . CHF (congestive heart failure) ([place])   . Diabetes mellitus without complication ([place])   . ED (erectile dysfunction)   . Elevated blood pressure   . Frequent urination   . Hypertension   . Prostate cancer ([place])   . Urinary stress incontinence, male     Family History  Problem Relation Age of Onset  . Prostate cancer Neg Hx   . Bladder Cancer Neg Hx   . Kidney disease Neg Hx     Social History   Social History  . Marital status: Married    Spouse name: N/A  . Number of children: N/A  . Years of education: N/A   Occupational History  . retired    Social History Main Topics  . Smoking status: Former Smoker    Quit date: 10/14/1994  . Smokeless  tobacco: Never Used  . Alcohol use No  . Drug use: No  . Sexual activity: Not on file   Other Topics Concern  . Not on file   Social History Narrative  . No narrative on file    Past Surgical History:  Procedure Laterality Date  . PROSTATE SURGERY     removal     Prescriptions Prior to Admission  Medication Sig Dispense Refill Last Dose  . amLODipine-benazepril (LOTREL) 5-20 MG per capsule    Taking  . atorvastatin (LIPITOR) 80 MG tablet Take 80 mg by mouth daily.   Taking  . Blood Glucose Monitoring Suppl (FIFTY50 GLUCOSE METER 2.0) w/Device KIT Use as directed.   Taking  . Blood Glucose Monitoring Suppl (ONE TOUCH ULTRA 2) W/DEVICE KIT Use as directed.   Taking  . canagliflozin (INVOKANA) 300 MG TABS tablet Take 300 mg by mouth daily before breakfast.   Taking  . CARTIA XT 180 MG 24 hr capsule Take by mouth daily.    Taking  . furosemide (LASIX) 20 MG tablet TAKE 1 TABLET DAILY   Taking  . glucose blood (ONE TOUCH ULTRA TEST) test strip Use 2 (two) times daily. Use as instructed.   Taking  . HUMALOG KWIKPEN 100 UNIT/ML KiwkPen Inject 5 Units into the skin every evening.     . Lancets 30G MISC Use 1 Device 2 (two) times daily.   Taking  . metFORMIN (GLUCOPHAGE) 1000 MG tablet TAKE 1 TABLET TWICE  A DAY WITH MEALS   Taking  . metoprolol succinate (TOPROL-XL) 100 MG 24 hr tablet Take 100 mg by mouth daily. Take with or immediately following a meal.   Taking  . pioglitazone (ACTOS) 30 MG tablet Take 30 mg by mouth daily.   Taking  . TRULICITY 1.5 OB/0.9GG SOPN    Taking  . valsartan (DIOVAN) 320 MG tablet TAKE 1 TABLET DAILY   Taking  . warfarin (COUMADIN) 5 MG tablet Take 5 mg by mouth daily.   Taking  . clobetasol ointment (TEMOVATE) 0.05 %    Taking    Physical Exam: Blood pressure (!) 143/99, pulse (!) 116, temperature 98.3 F (36.8 C), temperature source Oral, resp. rate 20, height '5\' 7"'  (1.702 m), weight 210 lb 12.8 oz (95.6 kg), SpO2 92 %.   Wt Readings from Last 1  Encounters:  05/21/16 210 lb 12.8 oz (95.6 kg)     General appearance: alert and cooperative Resp: clear to auscultation bilaterally Cardio: irregularly irregular rhythm GI: soft, non-tender; bowel sounds normal; no masses,  no organomegaly Extremities: extremities normal, atraumatic, no cyanosis or edema Neurologic: Grossly normal  Labs:   Lab Results  Component Value Date   WBC 7.6 05/19/2016   HGB 14.7 05/19/2016   HCT 43.1 05/19/2016   MCV 86.1 05/19/2016   PLT 205 05/19/2016    Recent Labs Lab 05/18/16 2037 05/19/16 0447  NA 133* 137  K 3.6 3.5  CL 99* 103  CO2 24 25  BUN 33* 30*  CREATININE 1.32* 1.26*  CALCIUM 8.7* 8.5*  PROT 7.3  --   BILITOT 1.3*  --   ALKPHOS 120  --   ALT 26  --   AST 32  --   GLUCOSE 283* 175*   Lab Results  Component Value Date   CKTOTAL 62 01/20/2013   CKMB 1.2 01/19/2013   TROPONINI <0.03 05/19/2016      Radiology: Mild right basilar airspace opacity suggesting possible pneumonia. EKG: Atrial fibrillation with variable ventricular response. ASSESSMENT AND PLAN:  81 year old male with history of atrial fibrillation which is treated as an outpatient with Cardizem and anticoagulated with warfarin. He has a diagnosis of pneumonia. He has a fairly rapid ventricular response. He has had labetalol as well as increased dose of diltiazem. Will continue with long-acting diltiazem and discontinue labetalol. Will place on short acting Cardizem 60 by mouth every 6-8 based on his heart rate. Will continue treatment of his pneumonia. Would defer amiodarone for now unless rate control becomes more difficult. Signed: Teodoro Spray MD, [facility] 05/21/2016, 11:25 AM

## 2016-05-21 NOTE — Progress Notes (Signed)
PT Cancellation Note  Patient Details Name: [name] MRN: [id number] DOB: 09-15-1934   Cancelled Treatment:    Reason Eval/Treat Not Completed: Patient not medically ready.  Pt's HR checked on telemetry screen outside of pt's room and pt's HR noted to be fluctuating between 110-151 bpm.  Pt does not appear appropriate to participate in PT at this time d/t this.  Will re-attempt PT treatment at a later date/time.   Raquel Sarna Gerard Cantara 05/21/2016, 11:56 AM Leitha Bleak, [place]

## 2016-05-21 NOTE — Care Management Important Message (Signed)
Important Message  Patient Details  Name: [name] MRN: [id number] Date of Birth: Dec 02, 1934   Medicare Important Message Given:  Yes    Katrina Stack, RN 05/21/2016, 5:27 PM

## 2016-05-21 NOTE — Progress Notes (Signed)
Patient weaned off 2L O2 [place] to room air. Patient's oxygen saturation at rest is 94-96% on room air.

## 2016-05-22 LAB — BASIC METABOLIC PANEL
Anion gap: 10 (ref 5–15)
BUN: 25 mg/dL — AB (ref 6–20)
CHLORIDE: 104 mmol/L (ref 101–111)
CO2: 24 mmol/L (ref 22–32)
CREATININE: 1.07 mg/dL (ref 0.61–1.24)
Calcium: 8.6 mg/dL — ABNORMAL LOW (ref 8.9–10.3)
GFR calc Af Amer: >60 mL/min (ref >60)
GFR calc non Af Amer: >60 mL/min (ref >60)
Glucose, Bld: 143 mg/dL — ABNORMAL HIGH (ref 65–99)
Potassium: 3.5 mmol/L (ref 3.5–5.1)
SODIUM: 138 mmol/L (ref 135–145)

## 2016-05-22 LAB — GLUCOSE, CAPILLARY
Glucose-Capillary: 148 mg/dL — ABNORMAL HIGH (ref 65–99)
Glucose-Capillary: 141 mg/dL — ABNORMAL HIGH (ref 65–99)

## 2016-05-22 LAB — PROTIME-INR
INR: 2.11
PROTHROMBIN TIME: 24 s — AB (ref 11.4–15.2)

## 2016-05-22 MED ORDER — DILTIAZEM HCL ER COATED BEADS 300 MG PO CP24
300.0000 mg | ORAL_CAPSULE | Freq: Every day | ORAL | Status: DC
Start: 2016-05-23 — End: 2016-05-22

## 2016-05-22 MED ORDER — LEVOFLOXACIN 500 MG PO TABS: 500.0000 mg | ORAL_TABLET | Freq: Every day | ORAL | 0 refills | Status: DC

## 2016-05-22 MED ORDER — DILTIAZEM HCL ER COATED BEADS 300 MG PO CP24: 300.0000 mg | ORAL_CAPSULE | Freq: Every day | ORAL | 1 refills | Status: DC

## 2016-05-22 NOTE — Care Management Note (Signed)
Case Management Note  Patient Details  Name: [name] MRN: [id number] Date of Birth: 03/13/1935  Subjective/Objective:      Call to [facility] at [place] per new oxygen orders. Order for 2L N/C continuous. Dx CHF. 02 sat on room air is 88%. [employer] will deliver a portable oxygen tank to Mr [facility] room 242 today and will set up new oxygen at the home address.               Action/Plan:   Expected Discharge Date:                  Expected Discharge Plan:     In-House Referral:     Discharge planning Services     Post Acute Care Choice:    Choice offered to:     DME Arranged:    DME Agency:     HH Arranged:    HH Agency:     Status of Service:     If discussed at [employer] of Stay Meetings, dates discussed:    Additional Comments:  Leafy Motsinger A, RN 05/22/2016, 12:00 PM

## 2016-05-22 NOTE — Care Management Note (Signed)
Case Management Note  Patient Details  Name: [name] MRN: [id number] Date of Birth: August 27, 1934  Subjective/Objective:     Call to [facility] at [place] per resumption of HH-PT and RN. Discussed possibility of new oxygen and that we would need 02 Saturation measurements to qualify Mr [facility] if new home oxygen is needed.            Action/Plan:   Expected Discharge Date:                  Expected Discharge Plan:     In-House Referral:     Discharge planning Services     Post Acute Care Choice:    Choice offered to:     DME Arranged:    DME Agency:     HH Arranged:    HH Agency:     Status of Service:     If discussed at [employer] of Stay Meetings, dates discussed:    Additional Comments:  Tuyen Uncapher A, RN 05/22/2016, 11:12 AM

## 2016-05-22 NOTE — Discharge Instructions (Signed)
Call and make appt to see PCP and cardiology next week

## 2016-05-22 NOTE — Progress Notes (Signed)
Patient discharged per MD order and hospital protocol. Patient's medications were called in to [place] by Dr. Posey Pronto. Patient verbalized understanding of discharge instructions, medications and follow up appointments. Patient's home oxygen was delivered and placed on patient, 2LO2NC. Patient was wheeled off unit by nurse to his car.

## 2016-05-22 NOTE — Discharge Summary (Signed)
[facility] at [place] NAME: [name]    MR#:  [id number]  DATE OF BIRTH:  03/10/1935  DATE OF ADMISSION:  05/18/2016 ADMITTING PHYSICIAN: Saundra Shelling, MD  DATE OF DISCHARGE: 05/22/16  PRIMARY CARE PHYSICIAN: Sherrin Daisy, MD    ADMISSION DIAGNOSIS:  Shortness of breath [R06.02] Atrial fibrillation, unspecified type ([place]) [I48.91] Dyspnea, unspecified type [R06.00]  DISCHARGE DIAGNOSIS:   Sepsis due to right side Pneumonia Acute on chronic afib with RVR (meds adjusted) Chronic anticoagulation SECONDARY DIAGNOSIS:   Past Medical History:  Diagnosis Date  . Carcinoma of prostate ([place])   . CHF (congestive heart failure) ([place])   . Diabetes mellitus without complication ([place])   . ED (erectile dysfunction)   . Elevated blood pressure   . Frequent urination   . Hypertension   . Prostate cancer ([place])   . Urinary stress incontinence, male     HOSPITAL COURSE:  [name] a 81 y.o.malewith a known history of Congestive heart failure, prostate cancer, diabetes mellitus type 2, hypertension presented to the emergency room with increased shortness of breath for the last couple of days.Patient does not use any home oxygen and hasn't been getting more short of breath for the almost a week.complains of orthopnea and has occasional cough  1.Sepsis due to right  Side Community-acquired pneumonia -came in with cough, elevated LA, tachycardia and CXR right side PNA -IV rocephin + zithromax---changed to by mouth Levaquin -BC negative -WBC normal, no fever today  2.Atrial fibrillation with rapid rate -on IV Cardizem gtt---wean to off and resume oral CCB and metoprolol( -cont warfarin -increased CCB to 300 mg daily and cont Toprol XL100 mg daily---oral Cardizem 60 mg 3 times a day prn (in house) added by cardiology Dr. Ubaldo Glassing -pt Is asymptomatic with intermittent tachycardia -I have held his amlodipine-benzepril  since we went up the CCB dosing  3.Congestive heart failure acute diastolic -echo in June 3254 NORMAL LEFT VENTRICULAR SYSTOLIC FUNCTION WITH MILD LVH NORMAL RIGHT VENTRICULAR SYSTOLIC FUNCTION MODERATE VALVULAR REGURGITATION (See above) NO VALVULAR STENOSIS MODERATE PHTN EF 55% -IV lasix 40 mg x1---now on home dose 20 mg daily  4.Hyponatremia -likely due to CHF and PNA  5.Hypertension -resumed home meds  6.Type 2 diabetes mellitus -on home meds and SSI  PT rec HHPT Overall improving. D/c home with out pt f/u Dr Nehemiah Massed next week  CONSULTS OBTAINED:  Treatment Team:  Teodoro Spray, MD  DRUG ALLERGIES:   Allergies  Allergen Reactions  . Contrast Media [Iodinated Diagnostic Agents]     SOB  . Iohexol      Desc: Respiratory Distress, Laryngedema, diaphoresis, Onset Date: [id number]   . Latex Rash    DISCHARGE MEDICATIONS:   Current Discharge Medication List    START taking these medications   Details  levofloxacin (LEVAQUIN) 500 MG tablet Take 1 tablet (500 mg total) by mouth daily. Qty: 3 tablet, Refills: 0      CONTINUE these medications which have CHANGED   Details  diltiazem (CARDIZEM CD) 300 MG 24 hr capsule Take 1 capsule (300 mg total) by mouth daily. Qty: 30 capsule, Refills: 1      CONTINUE these medications which have NOT CHANGED   Details  atorvastatin (LIPITOR) 80 MG tablet Take 80 mg by mouth daily.    !! Blood Glucose Monitoring Suppl (FIFTY50 GLUCOSE METER 2.0) w/Device KIT Use as directed.    !! Blood Glucose Monitoring Suppl (ONE TOUCH ULTRA 2) W/DEVICE KIT Use  as directed.    canagliflozin (INVOKANA) 300 MG TABS tablet Take 300 mg by mouth daily before breakfast.    furosemide (LASIX) 20 MG tablet TAKE 1 TABLET DAILY    glucose blood (ONE TOUCH ULTRA TEST) test strip Use 2 (two) times daily. Use as instructed.    HUMALOG KWIKPEN 100 UNIT/ML KiwkPen Inject 5 Units into the skin every evening.    Lancets 30G MISC Use 1  Device 2 (two) times daily.    metFORMIN (GLUCOPHAGE) 1000 MG tablet TAKE 1 TABLET TWICE A DAY WITH MEALS    metoprolol succinate (TOPROL-XL) 100 MG 24 hr tablet Take 100 mg by mouth daily. Take with or immediately following a meal.    pioglitazone (ACTOS) 30 MG tablet Take 30 mg by mouth daily.    TRULICITY 1.5 MW/1.0UV SOPN     valsartan (DIOVAN) 320 MG tablet TAKE 1 TABLET DAILY    warfarin (COUMADIN) 5 MG tablet Take 5 mg by mouth daily.    clobetasol ointment (TEMOVATE) 0.05 %      !! - Potential duplicate medications found. Please discuss with provider.    STOP taking these medications     amLODipine-benazepril (LOTREL) 5-20 MG per capsule         If you experience worsening of your admission symptoms, develop shortness of breath, life threatening emergency, suicidal or homicidal thoughts you must seek medical attention immediately by calling 911 or calling your MD immediately  if symptoms less severe.  You Must read complete instructions/literature along with all the possible adverse reactions/side effects for all the Medicines you take and that have been prescribed to you. Take any new Medicines after you have completely understood and accept all the possible adverse reactions/side effects.   Please note  You were cared for by a hospitalist during your hospital stay. If you have any questions about your discharge medications or the care you received while you were in the hospital after you are discharged, you can call the unit and asked to speak with the hospitalist on call if the hospitalist that took care of you is not available. Once you are discharged, your primary care physician will handle any further medical issues. Please note that NO REFILLS for any discharge medications will be authorized once you are discharged, as it is imperative that you return to your primary care physician (or establish a relationship with a primary care physician if you do not have one) for  your aftercare needs so that they can reassess your need for medications and monitor your lab values. Today   SUBJECTIVE     VITAL SIGNS:  Blood pressure 137/78, pulse 76, temperature 98.6 F (37 C), temperature source Oral, resp. rate 18, height _0  (1.702 m), weight 95.6 kg (210 lb 12.8 oz), SpO2 93 %.  I/O:   Intake/Output Summary (Last 24 hours) at 05/22/16 1133 Last data filed at 05/22/16 0556  Gross per 24 hour  Intake              240 ml  Output              451 ml  Net             -211 ml    PHYSICAL EXAMINATION:  GENERAL:  81 y.o.-year-old patient lying in the bed with no acute distress.  EYES: Pupils equal, round, reactive to light and accommodation. No scleral icterus. Extraocular muscles intact.  HEENT: Head atraumatic, normocephalic. Oropharynx and nasopharynx clear.  NECK:  Supple, no jugular venous distention. No thyroid enlargement, no tenderness.  LUNGS: Normal breath sounds bilaterally, no wheezing, rales,rhonchi or crepitation. No use of accessory muscles of respiration.  CARDIOVASCULAR: S1, S2 normal. No murmurs, rubs, or gallops.  ABDOMEN: Soft, non-tender, non-distended. Bowel sounds present. No organomegaly or mass.  EXTREMITIES: No pedal edema, cyanosis, or clubbing.  NEUROLOGIC: Cranial nerves II through XII are intact. Muscle strength 5/5 in all extremities. Sensation intact. Gait not checked.  PSYCHIATRIC: The patient is alert and oriented x 3.  SKIN: No obvious rash, lesion, or ulcer.   DATA REVIEW:   CBC   Recent Labs Lab 05/19/16 0447  WBC 7.6  HGB 14.7  HCT 43.1  PLT 205    Chemistries   Recent Labs Lab 05/18/16 2037  05/22/16 0605  NA 133*  < > 138  K 3.6  < > 3.5  CL 99*  < > 104  CO2 24  < > 24  GLUCOSE 283*  < > 143*  BUN 33*  < > 25*  CREATININE 1.32*  < > 1.07  CALCIUM 8.7*  < > 8.6*  AST 32  --   --   ALT 26  --   --   ALKPHOS 120  --   --   BILITOT 1.3*  --   --   < > = values in this interval not  displayed.  Microbiology Results   Recent Results (from the past 240 hour(s))  Blood culture (routine x 2)     Status: None (Preliminary result)   Collection Time: 05/18/16 11:26 PM  Result Value Ref Range Status   Specimen Description BLOOD  L HAND  Final   Special Requests   Final    BOTTLES DRAWN AEROBIC AND ANAEROBIC  AER 14 ML ANA 12 ML   Culture NO GROWTH 4 DAYS  Final   Report Status PENDING  Incomplete  Blood culture (routine x 2)     Status: None (Preliminary result)   Collection Time: 05/18/16 11:27 PM  Result Value Ref Range Status   Specimen Description BLOOD  L AC  Final   Special Requests   Final    BOTTLES DRAWN AEROBIC AND ANAEROBIC  AER 12 ML ANA 12 ML   Culture NO GROWTH 4 DAYS  Final   Report Status PENDING  Incomplete    RADIOLOGY:  No results found.   Management plans discussed with the patient, family and they are in agreement.  CODE STATUS:     Code Status Orders        Start     Ordered   05/19/16 0454  Full code  Continuous     05/19/16 0454    Code Status History    Date Active Date Inactive Code Status Order ID Comments User Context   This patient has a current code status but no historical code status.      TOTAL TIME TAKING CARE OF THIS PATIENT: 40 minutes.    Lempi Edwin M.D on 05/22/2016 at 11:33 AM  Between 7am to 6pm - Pager - [phone] After 6pm go to [email or web address] - password [email or web address] [facility]  [facility]  Office  [phone]  CC: Primary care physician; Sherrin Daisy, MD

## 2016-05-22 NOTE — Progress Notes (Signed)
Patient Saturations on Room Air at Rest = 96%  Patient Saturations on [employer] while Ambulating = 88%  Patient Saturations on 2 Liters of oxygen while Ambulating =100%

## 2016-05-22 NOTE — Progress Notes (Signed)
ANTICOAGULATION CONSULT NOTE - Follow Up Pharmacy Consult for Warfarin Indication: atrial fibrillation  Allergies  Allergen Reactions  . Contrast Media [Iodinated Diagnostic Agents]     SOB  . Iohexol      Desc: Respiratory Distress, Laryngedema, diaphoresis, Onset Date: [id number]   . Latex Rash    Patient Measurements: Height: '5\' 7"'  (170.2 cm) Weight: 210 lb 12.8 oz (95.6 kg) IBW/kg (Calculated) : 66.1  Vital Signs: Temp: 97.7 F (36.5 C) (12/30 0715) Temp Source: Oral (12/30 0715) BP: 155/83 (12/30 0715) Pulse Rate: 100 (12/30 0715)  Labs:  Recent Labs  05/19/16 1100 05/19/16 1713 05/20/16 1406 05/21/16 0441 05/22/16 0605  LABPROT  --   --  19.6* 22.0* 24.0*  INR  --   --  1.64 1.89 2.11  TROPONINI <0.03 <0.03  --   --   --     Estimated Creatinine Clearance: 52.4 mL/min (by C-G formula based on SCr of 1.26 mg/dL (H)).   Medical History: Past Medical History:  Diagnosis Date  . Carcinoma of prostate ([place])   . CHF (congestive heart failure) ([place])   . Diabetes mellitus without complication ([place])   . ED (erectile dysfunction)   . Elevated blood pressure   . Frequent urination   . Hypertension   . Prostate cancer ([place])   . Urinary stress incontinence, male     Medications:  Prescriptions Prior to Admission  Medication Sig Dispense Refill Last Dose  . amLODipine-benazepril (LOTREL) 5-20 MG per capsule    Taking  . atorvastatin (LIPITOR) 80 MG tablet Take 80 mg by mouth daily.   Taking  . Blood Glucose Monitoring Suppl (FIFTY50 GLUCOSE METER 2.0) w/Device KIT Use as directed.   Taking  . Blood Glucose Monitoring Suppl (ONE TOUCH ULTRA 2) W/DEVICE KIT Use as directed.   Taking  . canagliflozin (INVOKANA) 300 MG TABS tablet Take 300 mg by mouth daily before breakfast.   Taking  . CARTIA XT 180 MG 24 hr capsule Take by mouth daily.    Taking  . furosemide (LASIX) 20 MG tablet TAKE 1 TABLET DAILY   Taking  . glucose blood (ONE TOUCH ULTRA TEST) test strip Use  2 (two) times daily. Use as instructed.   Taking  . HUMALOG KWIKPEN 100 UNIT/ML KiwkPen Inject 5 Units into the skin every evening.     . Lancets 30G MISC Use 1 Device 2 (two) times daily.   Taking  . metFORMIN (GLUCOPHAGE) 1000 MG tablet TAKE 1 TABLET TWICE A DAY WITH MEALS   Taking  . metoprolol succinate (TOPROL-XL) 100 MG 24 hr tablet Take 100 mg by mouth daily. Take with or immediately following a meal.   Taking  . pioglitazone (ACTOS) 30 MG tablet Take 30 mg by mouth daily.   Taking  . TRULICITY 1.5 IO/2.7OJ SOPN    Taking  . valsartan (DIOVAN) 320 MG tablet TAKE 1 TABLET DAILY   Taking  . warfarin (COUMADIN) 5 MG tablet Take 5 mg by mouth daily.   Taking  . clobetasol ointment (TEMOVATE) 0.05 %    Taking    Assessment: 81 y/o M with a h/o AF on warfarin admitted with sepsis due to PNA. Patient is receiving abx that may potentiate INR.   12/28 INR = 1.64  Warfarin 75m given 12/29 INR = 1.89  Warfarin 559mgiven 12/30 INR = 2.11  Goal of Therapy:  INR 2-3   Plan:  Continue warfarin 5 mg daily and f/u AM INR.   [name]  K, [facility] Clinical Pharmacist 05/22/2016,9:18 AM

## 2016-05-22 NOTE — Progress Notes (Signed)
[place]  SUBJECTIVE: Denies shortness of breath. Anxious to go home. Heart rate still somewhat variable. Patient is asymptomatic from this.   Vitals:   05/21/16 1530 05/21/16 1923 05/22/16 0421 05/22/16 0715  BP:  136/75 137/74 (!) 155/83  Pulse:  86 65 100  Resp:  18 18   Temp:  98 F (36.7 C) 98 F (36.7 C) 97.7 F (36.5 C)  TempSrc:  Oral Oral Oral  SpO2: 96% 92% 91% (!) 89%  Weight:      Height:        Intake/Output Summary (Last 24 hours) at 05/22/16 0848 Last data filed at 05/22/16 0556  Gross per 24 hour  Intake              480 ml  Output              451 ml  Net               29 ml    LABS: Basic Metabolic Panel: No results for input(s): NA, K, CL, CO2, GLUCOSE, BUN, CREATININE, CALCIUM, MG, PHOS in the last 72 hours. Liver Function Tests: No results for input(s): AST, ALT, ALKPHOS, BILITOT, PROT, ALBUMIN in the last 72 hours. No results for input(s): LIPASE, AMYLASE in the last 72 hours. CBC: No results for input(s): WBC, NEUTROABS, HGB, HCT, MCV, PLT in the last 72 hours. Cardiac Enzymes:  Recent Labs  05/19/16 1100 05/19/16 1713  TROPONINI <0.03 <0.03   BNP: Invalid input(s): POCBNP D-Dimer: No results for input(s): DDIMER in the last 72 hours. Hemoglobin A1C: No results for input(s): HGBA1C in the last 72 hours. Fasting Lipid Panel: No results for input(s): CHOL, HDL, LDLCALC, TRIG, CHOLHDL, LDLDIRECT in the last 72 hours. Thyroid Function Tests: No results for input(s): TSH, T4TOTAL, T3FREE, THYROIDAB in the last 72 hours.  Invalid input(s): FREET3 Anemia Panel: No results for input(s): VITAMINB12, FOLATE, FERRITIN, TIBC, IRON, RETICCTPCT in the last 72 hours.   Physical Exam: Blood pressure (!) 155/83, pulse 100, temperature 97.7 F (36.5 C), temperature source Oral, resp. rate 18, height '5\' 7"'  (1.702 m), weight 210 lb 12.8 oz (95.6 kg), SpO2 (!) 89 %.   Wt Readings from Last 1 Encounters:   05/21/16 210 lb 12.8 oz (95.6 kg)     General appearance: alert and cooperative Resp: rhonchi bilaterally Cardio: irregularly irregular rhythm GI: soft, non-tender; bowel sounds normal; no masses,  no organomegaly Extremities: extremities normal, atraumatic, no cyanosis or edema Neurologic: Grossly normal  TELEMETRY: Reviewed telemetry pt in atrial fibrillation with variable ventricular response with rates between: 80-90s to 125.   ASSESSMENT AND PLAN:  Active Problems:   Pneumonia-continues on Levaquin. Complains of less shortness of breath. Continue with bronchodilators.   Atrial fibrillation with rapid ventricular response-currently on Cardizem CD at 240 mg daily, metoprolol succinate 100 mg daily with Cardizem short acting at 60 mg when necessary heart rate above 120. Rate remained somewhat variable. Have increased his Cardizem CD to 300 mg daily. He is anticoagulated with warfarin. He appears fairly asymptomatic from this. I am somewhat hesitant to add amiodarone given pulmonary issues. Pulse oximetry continues to intermittently drop below 90. Will check met b to evaluate electrolytes and hydration status.     Teodoro Spray, MD, [facility] 05/22/2016 8:48 AM

## 2016-05-23 LAB — CULTURE, BLOOD (ROUTINE X 2)
CULTURE: NO GROWTH
Culture: NO GROWTH

## 2016-05-26 ENCOUNTER — Ambulatory Visit (INDEPENDENT_AMBULATORY_CARE_PROVIDER_SITE_OTHER): Payer: Medicare Other | Admitting: Vascular Surgery

## 2016-05-26 ENCOUNTER — Encounter (INDEPENDENT_AMBULATORY_CARE_PROVIDER_SITE_OTHER): Payer: Medicare Other

## 2016-06-03 ENCOUNTER — Ambulatory Visit
Admission: RE | Admit: 2016-06-03 | Discharge: 2016-06-03 | Disposition: A | Discharge status: Home / Self Care | Payer: Medicare Other | Source: Ambulatory Visit | Attending: Family Medicine | Admitting: Family Medicine

## 2016-06-03 ENCOUNTER — Other Ambulatory Visit: Payer: Self-pay | Admitting: Family Medicine

## 2016-06-03 DIAGNOSIS — I700 Atherosclerosis of aorta: Secondary | ICD-10-CM | POA: Diagnosis not present

## 2016-06-03 DIAGNOSIS — Z8701 Personal history of pneumonia (recurrent): Secondary | ICD-10-CM | POA: Insufficient documentation

## 2016-06-17 ENCOUNTER — Other Ambulatory Visit: Payer: Medicare Other

## 2016-06-24 ENCOUNTER — Ambulatory Visit: Payer: Medicare Other | Admitting: Urology

## 2016-08-10 ENCOUNTER — Ambulatory Visit (INDEPENDENT_AMBULATORY_CARE_PROVIDER_SITE_OTHER): Payer: Medicare Other

## 2016-08-10 ENCOUNTER — Ambulatory Visit (INDEPENDENT_AMBULATORY_CARE_PROVIDER_SITE_OTHER): Payer: Medicare Other | Admitting: Vascular Surgery

## 2016-08-10 ENCOUNTER — Encounter (INDEPENDENT_AMBULATORY_CARE_PROVIDER_SITE_OTHER): Payer: Self-pay

## 2016-08-10 VITALS — BP 147/98 | HR 58 | Resp 16 | Ht 67.0 in | Wt 209.0 lb

## 2016-08-10 DIAGNOSIS — E119 Type 2 diabetes mellitus without complications: Secondary | ICD-10-CM

## 2016-08-10 DIAGNOSIS — Z794 Long term (current) use of insulin: Secondary | ICD-10-CM | POA: Diagnosis not present

## 2016-08-10 DIAGNOSIS — E785 Hyperlipidemia, unspecified: Secondary | ICD-10-CM | POA: Diagnosis not present

## 2016-08-10 DIAGNOSIS — I739 Peripheral vascular disease, unspecified: Secondary | ICD-10-CM

## 2016-08-10 DIAGNOSIS — I10 Essential (primary) hypertension: Secondary | ICD-10-CM | POA: Diagnosis not present

## 2016-08-10 NOTE — Assessment & Plan Note (Signed)
blood glucose control important in reducing the progression of atherosclerotic disease. Also, involved in wound healing. On appropriate medications.  

## 2016-08-10 NOTE — Assessment & Plan Note (Signed)
blood pressure control important in reducing the progression of atherosclerotic disease. On appropriate oral medications.  

## 2016-08-10 NOTE — Assessment & Plan Note (Signed)
The patient's noninvasive studies today demonstrate a right ABI of 0.77 with a digital pressure of 62. The left ABI is noncompressible but the waveforms are monophasic and dampened in the digital pressure was significantly dampened compared to the right. Given his clinical scenario of ulceration and infection on the left foot, this is clearly a critical and limb threatening situation. I have discussed the pathophysiology and natural history of peripheral arterial disease. At this point, I have recommended an angiogram be performed for further evaluation and treatment. I have discussed the risks and benefits of this procedure. I have discussed the possibility that he could continue to have wounds and pain even with better circulation. He will continue his local wound care as current. His angiogram will be scheduled for next week.

## 2016-08-10 NOTE — Assessment & Plan Note (Signed)
lipid control important in reducing the progression of atherosclerotic disease. Continue statin therapy  

## 2016-08-10 NOTE — Patient Instructions (Signed)
Peripheral Vascular Disease Peripheral vascular disease (PVD) is a disease of the blood vessels that are not part of your heart and brain. A simple term for PVD is poor circulation. In most cases, PVD narrows the blood vessels that carry blood from your heart to the rest of your body. This can result in a decreased supply of blood to your arms, legs, and internal organs, like your stomach or kidneys. However, it most often affects a person's lower legs and feet. There are two types of PVD.  Organic PVD. This is the more common type. It is caused by damage to the structure of blood vessels.  Functional PVD. This is caused by conditions that make blood vessels contract and tighten (spasm).  Without treatment, PVD tends to get worse over time. PVD can also lead to acute ischemic limb. This is when an arm or limb suddenly has trouble getting enough blood. This is a medical emergency. What are the causes? Each type of PVD has many different causes. The most common cause of PVD is buildup of a fatty material (plaque) inside of your arteries (atherosclerosis). Small amounts of plaque can break off from the walls of the blood vessels and become lodged in a smaller artery. This blocks blood flow and can cause acute ischemic limb. Other common causes of PVD include:  Blood clots that form inside of blood vessels.  Injuries to blood vessels.  Diseases that cause inflammation of blood vessels or cause blood vessel spasms.  Health behaviors and health history that increase your risk of developing PVD.  What increases the risk? You may have a greater risk of PVD if you:  Have a family history of PVD.  Have certain medical conditions, including: ? High cholesterol. ? Diabetes. ? High blood pressure (hypertension). ? Coronary heart disease. ? Past problems with blood clots. ? Past injury, such as burns or a broken bone. These may have damaged blood vessels in your limbs. ? Buerger disease. This is  caused by inflamed blood vessels in your hands and feet. ? Some forms of arthritis. ? Rare birth defects that affect the arteries in your legs.  Use tobacco.  Do not get enough exercise.  Are obese.  Are age 50 or older.  What are the signs or symptoms? PVD may cause many different symptoms. Your symptoms depend on what part of your body is not getting enough blood. Some common signs and symptoms include:  Cramps in your lower legs. This may be a symptom of poor leg circulation (claudication).  Pain and weakness in your legs while you are physically active that goes away when you rest (intermittent claudication).  Leg pain when at rest.  Leg numbness, tingling, or weakness.  Coldness in a leg or foot, especially when compared with the other leg.  Skin or hair changes. These can include: ? Hair loss. ? Shiny skin. ? Pale or bluish skin. ? Thick toenails.  Inability to get or maintain an erection (erectile dysfunction).  People with PVD are more prone to developing ulcers and sores on their toes, feet, or legs. These may take longer than normal to heal. How is this diagnosed? Your health care provider may diagnose PVD from your signs and symptoms. The health care provider will also do a physical exam. You may have tests to find out what is causing your PVD and determine its severity. Tests may include:  Blood pressure recordings from your arms and legs and measurements of the strength of your pulses (  pulse volume recordings).  Imaging studies using sound waves to take pictures of the blood flow through your blood vessels (Doppler ultrasound).  Injecting a dye into your blood vessels before having imaging studies using: ? X-rays (angiogram or arteriogram). ? Computer-generated X-rays (CT angiogram). ? A powerful electromagnetic field and a computer (magnetic resonance angiogram or MRA).  How is this treated? Treatment for PVD depends on the cause of your condition and the  severity of your symptoms. It also depends on your age. Underlying causes need to be treated and controlled. These include long-lasting (chronic) conditions, such as diabetes, high cholesterol, and high blood pressure. You may need to first try making lifestyle changes and taking medicines. Surgery may be needed if these do not work. Lifestyle changes may include:  Quitting smoking.  Exercising regularly.  Following a low-fat, low-cholesterol diet.  Medicines may include:  Blood thinners to prevent blood clots.  Medicines to improve blood flow.  Medicines to improve your blood cholesterol levels.  Surgical procedures may include:  A procedure that uses an inflated balloon to open a blocked artery and improve blood flow (angioplasty).  A procedure to put in a tube (stent) to keep a blocked artery open (stent implant).  Surgery to reroute blood flow around a blocked artery (peripheral bypass surgery).  Surgery to remove dead tissue from an infected wound on the affected limb.  Amputation. This is surgical removal of the affected limb. This may be necessary in cases of acute ischemic limb that are not improved through medical or surgical treatments.  Follow these instructions at home:  Take medicines only as directed by your health care provider.  Do not use any tobacco products, including cigarettes, chewing tobacco, or electronic cigarettes. If you need help quitting, ask your health care provider.  Lose weight if you are overweight, and maintain a healthy weight as directed by your health care provider.  Eat a diet that is low in fat and cholesterol. If you need help, ask your health care provider.  Exercise regularly. Ask your health care provider to suggest some good activities for you.  Use compression stockings or other mechanical devices as directed by your health care provider.  Take good care of your feet. ? Wear comfortable shoes that fit well. ? Check your feet  often for any cuts or sores. Contact a health care provider if:  You have cramps in your legs while walking.  You have leg pain when you are at rest.  You have coldness in a leg or foot.  Your skin changes.  You have erectile dysfunction.  You have cuts or sores on your feet that are not healing. Get help right away if:  Your arm or leg turns cold and blue.  Your arms or legs become red, warm, swollen, painful, or numb.  You have chest pain or trouble breathing.  You suddenly have weakness in your face, arm, or leg.  You become very confused or lose the ability to speak.  You suddenly have a very bad headache or lose your vision. This information is not intended to replace advice given to you by your health care provider. Make sure you discuss any questions you have with your health care provider. Document Released: 06/17/2004 Document Revised: 10/16/2015 Document Reviewed: 10/18/2013 [employer] Interactive Patient Education  2017 [employer]  

## 2016-08-10 NOTE — Progress Notes (Signed)
Patient ID: [name], male   DOB: [birth year], 82 y.o.   MRN: [id number]  Chief Complaint  Patient presents with  . New Evaluation    ABI    HPI [name] is a 82 y.o. male.  I am asked to see the patient by Dr. Elvina Mattes for evaluation of PAD.  The patient reports several months of difficulties with fissures and small ulcerations between his toes on the left foot. His battled infection of the wounds. His glucose has been reasonably well controlled and his diabetic doctor has been happy with that. Nonetheless, his wounds have not improved and the pain has been getting worse. He has no wounds and no significant pain in the right leg. He reports no trauma, injury, or inciting event that started the ulcerations. Despite good wound care they are not improving well. He denies fever or chills. The patient's noninvasive studies today demonstrate a right ABI of 0.77 with a digital pressure of 62. The left ABI is noncompressible but the waveforms are monophasic and dampened in the digital pressure was significantly dampened compared to the right.   Past Medical History:  Diagnosis Date  . Carcinoma of prostate ([place])   . CHF (congestive heart failure) ([place])   . Diabetes mellitus without complication ([place])   . ED (erectile dysfunction)   . Elevated blood pressure   . Frequent urination   . Hypertension   . Prostate cancer ([place])   . Urinary stress incontinence, male     Past Surgical History:  Procedure Laterality Date  . PROSTATE SURGERY     removal    Family History  Problem Relation Age of Onset  . Prostate cancer Neg Hx   . Bladder Cancer Neg Hx   . Kidney disease Neg Hx   No bleeding disorders, clotting disorders, autoimmune diseases, or aneurysms  Social History Social History  Substance Use Topics  . Smoking status: Former Smoker    Quit date: 10/14/1994  . Smokeless tobacco: Never Used  . Alcohol use No  Married, wife accompanies today  Allergies  Allergen  Reactions  . Contrast Media [Iodinated Diagnostic Agents]     SOB  . Iohexol      Desc: Respiratory Distress, Laryngedema, diaphoresis, Onset Date: [id number]   . Latex Rash    Current Outpatient Prescriptions  Medication Sig Dispense Refill  . atorvastatin (LIPITOR) 80 MG tablet Take 80 mg by mouth daily.    . Blood Glucose Monitoring Suppl (FIFTY50 GLUCOSE METER 2.0) w/Device KIT Use as directed.    . Blood Glucose Monitoring Suppl (ONE TOUCH ULTRA 2) W/DEVICE KIT Use as directed.    . canagliflozin (INVOKANA) 300 MG TABS tablet Take 300 mg by mouth daily before breakfast.    . clobetasol ointment (TEMOVATE) 0.05 %     . diltiazem (CARDIZEM CD) 300 MG 24 hr capsule Take 1 capsule (300 mg total) by mouth daily. 30 capsule 1  . furosemide (LASIX) 20 MG tablet TAKE 1 TABLET DAILY    . glucose blood (ONE TOUCH ULTRA TEST) test strip Use 2 (two) times daily. Use as instructed.    Marland Kitchen HUMALOG KWIKPEN 100 UNIT/ML KiwkPen Inject 5 Units into the skin every evening.    . Lancets 30G MISC Use 1 Device 2 (two) times daily.    Marland Kitchen levofloxacin (LEVAQUIN) 500 MG tablet Take 1 tablet (500 mg total) by mouth daily. 3 tablet 0  . metFORMIN (GLUCOPHAGE) 1000 MG tablet TAKE 1 TABLET TWICE A  DAY WITH MEALS    . metoprolol succinate (TOPROL-XL) 100 MG 24 hr tablet Take 100 mg by mouth daily. Take with or immediately following a meal.    . pioglitazone (ACTOS) 30 MG tablet Take 30 mg by mouth daily.    . TRULICITY 1.5 LP/3.7TK SOPN     . valsartan (DIOVAN) 320 MG tablet TAKE 1 TABLET DAILY    . warfarin (COUMADIN) 5 MG tablet Take 5 mg by mouth daily.     No current facility-administered medications for this visit.       REVIEW OF SYSTEMS (Negative unless checked)  Constitutional: '[]' Weight loss  '[]' Fever  '[]' Chills Cardiac: '[]' Chest pain   '[]' Chest pressure   '[]' Palpitations   '[]' Shortness of breath when laying flat   '[]' Shortness of breath at rest   '[x]' Shortness of breath with exertion. Vascular:  '[x]' Pain in  legs with walking   '[]' Pain in legs at rest   '[]' Pain in legs when laying flat   '[]' Claudication   '[x]' Pain in feet when walking  '[x]' Pain in feet at rest  '[]' Pain in feet when laying flat   '[]' History of DVT   '[]' Phlebitis   '[x]' Swelling in legs   '[]' Varicose veins   '[x]' Non-healing ulcers Pulmonary:   '[]' Uses home oxygen   '[]' Productive cough   '[]' Hemoptysis   '[]' Wheeze  '[]' COPD   '[]' Asthma Neurologic:  '[]' Dizziness  '[]' Blackouts   '[]' Seizures   '[]' History of stroke   '[]' History of TIA  '[]' Aphasia   '[]' Temporary blindness   '[]' Dysphagia   '[]' Weakness or numbness in arms   '[]' Weakness or numbness in legs Musculoskeletal:  '[x]' Arthritis   '[]' Joint swelling   '[]' Joint pain   '[]' Low back pain Hematologic:  '[]' Easy bruising  '[]' Easy bleeding   '[]' Hypercoagulable state   '[]' Anemic  '[]' Hepatitis Gastrointestinal:  '[]' Blood in stool   '[]' Vomiting blood  '[]' Gastroesophageal reflux/heartburn   '[]' Abdominal pain Genitourinary:  '[]' Chronic kidney disease   '[]' Difficult urination  '[]' Frequent urination  '[]' Burning with urination   '[]' Hematuria Skin:  '[]' Rashes   '[x]' Ulcers   '[x]' Wounds Psychological:  '[]' History of anxiety   '[]'  History of major depression.    Physical Exam BP (!) 147/98 (BP Location: Left Arm)   Pulse (!) 58   Resp 16   Ht '5\' 7"'  (1.702 m)   Wt 209 lb (94.8 kg)   BMI 32.73 kg/m  Gen:  WD/WN, NAD Head: [place]/AT, No temporalis wasting. Ear/Nose/Throat: Hearing grossly intact, nares w/o erythema or drainage, oropharynx w/o Erythema/Exudate Eyes: Conjunctiva clear, sclera non-icteric  Neck: trachea midline.  No JVD.  Pulmonary:  Good air movement, no use of accessory muscles, respirations not labored.  Cardiac: RRR, normal S1, S2 Vascular:  Vessel Right Left  Radial Palpable Palpable  Ulnar Palpable Palpable  Brachial Palpable Palpable  Carotid Palpable, without bruit Palpable, without bruit  Aorta Not palpable N/A  Femoral Palpable Palpable  Popliteal 1+ Palpable Not Palpable  PT 1+ Palpable Not Palpable  DP 1+ Palpable Trace  Palpable   Gastrointestinal: soft, non-tender/non-distended. No guarding/reflex. No masses, surgical incisions, or scars. Musculoskeletal: M/S 5/5 throughout.  Several small ulcerations between toes and on the base of the left great toe. Mild surrounding erythema. No right lower extremity ulcerations or edema. No deformity or atrophy. 1+ LLE edema.  Neurologic: Sensation grossly intact in extremities.  Symmetrical.  Speech is fluent. Motor exam as listed above. Psychiatric: Judgment intact, Mood & affect appropriate for pt's clinical situation. Dermatologic: Small wounds on the left foot as described above Lymph : No Cervical, Axillary,  or Inguinal lymphadenopathy.   Radiology No results found.  Labs Recent Results (from the past 2160 hour(s))  CBC with Differential     Status: Abnormal   Collection Time: 05/18/16  8:37 PM  Result Value Ref Range   WBC 8.1 3.8 - 10.6 K/uL   RBC 5.37 4.40 - 5.90 MIL/uL   Hemoglobin 16.0 13.0 - 18.0 g/dL   HCT 46.9 40.0 - 52.0 %   MCV 87.4 80.0 - 100.0 fL   MCH 29.7 26.0 - 34.0 pg   MCHC 34.0 32.0 - 36.0 g/dL   RDW 15.2 (H) 11.5 - 14.5 %   Platelets 181 150 - 440 K/uL    Comment: COUNT MAY BE INACCURATE DUE TO FIBRIN CLUMPS.   Neutrophils Relative % 65 %   Neutro Abs 5.3 1.4 - 6.5 K/uL   Lymphocytes Relative 16 %   Lymphs Abs 1.3 1.0 - 3.6 K/uL   Monocytes Relative 12 %   Monocytes Absolute 1.0 0.2 - 1.0 K/uL   Eosinophils Relative 6 %   Eosinophils Absolute 0.5 0 - 0.7 K/uL   Basophils Relative 1 %   Basophils Absolute 0.1 0 - 0.1 K/uL  Comprehensive metabolic panel     Status: Abnormal   Collection Time: 05/18/16  8:37 PM  Result Value Ref Range   Sodium 133 (L) 135 - 145 mmol/L   Potassium 3.6 3.5 - 5.1 mmol/L   Chloride 99 (L) 101 - 111 mmol/L   CO2 24 22 - 32 mmol/L   Glucose, Bld 283 (H) 65 - 99 mg/dL   BUN 33 (H) 6 - 20 mg/dL   Creatinine, Ser 1.32 (H) 0.61 - 1.24 mg/dL   Calcium 8.7 (L) 8.9 - 10.3 mg/dL   Total Protein 7.3 6.5 -  8.1 g/dL   Albumin 3.5 3.5 - 5.0 g/dL   AST 32 15 - 41 U/L   ALT 26 17 - 63 U/L   Alkaline Phosphatase 120 38 - 126 U/L   Total Bilirubin 1.3 (H) 0.3 - 1.2 mg/dL   GFR calc non Af Amer 50 (L) >60 mL/min   GFR calc Af Amer 58 (L) >60 mL/min    Comment: (NOTE) The eGFR has been calculated using the CKD EPI equation. This calculation has not been validated in all clinical situations. eGFR's persistently <60 mL/min signify possible Chronic Kidney Disease.    Anion gap 10 5 - 15  Troponin I     Status: None   Collection Time: 05/18/16  8:37 PM  Result Value Ref Range   Troponin I <0.03 <0.03 ng/mL  Lactic acid, plasma     Status: Abnormal   Collection Time: 05/18/16  9:02 PM  Result Value Ref Range   Lactic Acid, Venous 2.4 (HH) 0.5 - 1.9 mmol/L    Comment: CRITICAL RESULT CALLED TO, READ BACK BY AND VERIFIED WITH THERESA HUDSON RN AT 2210 05/18/16 MSS.   Brain natriuretic peptide     Status: Abnormal   Collection Time: 05/18/16 11:07 PM  Result Value Ref Range   B Natriuretic Peptide 324.0 (H) 0.0 - 100.0 pg/mL  Blood culture (routine x 2)     Status: None   Collection Time: 05/18/16 11:26 PM  Result Value Ref Range   Specimen Description BLOOD  L HAND    Special Requests      BOTTLES DRAWN AEROBIC AND ANAEROBIC  AER 14 ML ANA 12 ML   Culture NO GROWTH 5 DAYS    Report Status 05/23/2016 FINAL  Blood culture (routine x 2)     Status: None   Collection Time: 05/18/16 11:27 PM  Result Value Ref Range   Specimen Description BLOOD  L AC    Special Requests      BOTTLES DRAWN AEROBIC AND ANAEROBIC  AER 12 ML ANA 12 ML   Culture NO GROWTH 5 DAYS    Report Status 05/23/2016 FINAL   Lactic acid, plasma     Status: None   Collection Time: 05/19/16 12:20 AM  Result Value Ref Range   Lactic Acid, Venous 1.5 0.5 - 1.9 mmol/L  Protime-INR     Status: Abnormal   Collection Time: 05/19/16  4:47 AM  Result Value Ref Range   Prothrombin Time 19.2 (H) 11.4 - 15.2 seconds   INR 1.60     Troponin I     Status: None   Collection Time: 05/19/16  4:47 AM  Result Value Ref Range   Troponin I <0.03 <0.03 ng/mL  Basic metabolic panel     Status: Abnormal   Collection Time: 05/19/16  4:47 AM  Result Value Ref Range   Sodium 137 135 - 145 mmol/L   Potassium 3.5 3.5 - 5.1 mmol/L   Chloride 103 101 - 111 mmol/L   CO2 25 22 - 32 mmol/L   Glucose, Bld 175 (H) 65 - 99 mg/dL   BUN 30 (H) 6 - 20 mg/dL   Creatinine, Ser 1.26 (H) 0.61 - 1.24 mg/dL   Calcium 8.5 (L) 8.9 - 10.3 mg/dL   GFR calc non Af Amer 52 (L) >60 mL/min   GFR calc Af Amer >60 >60 mL/min    Comment: (NOTE) The eGFR has been calculated using the CKD EPI equation. This calculation has not been validated in all clinical situations. eGFR's persistently <60 mL/min signify possible Chronic Kidney Disease.    Anion gap 9 5 - 15  CBC     Status: Abnormal   Collection Time: 05/19/16  4:47 AM  Result Value Ref Range   WBC 7.6 3.8 - 10.6 K/uL   RBC 5.00 4.40 - 5.90 MIL/uL   Hemoglobin 14.7 13.0 - 18.0 g/dL   HCT 43.1 40.0 - 52.0 %   MCV 86.1 80.0 - 100.0 fL   MCH 29.4 26.0 - 34.0 pg   MCHC 34.1 32.0 - 36.0 g/dL   RDW 14.8 (H) 11.5 - 14.5 %   Platelets 205 150 - 440 K/uL  Glucose, capillary     Status: Abnormal   Collection Time: 05/19/16  6:18 AM  Result Value Ref Range   Glucose-Capillary 177 (H) 65 - 99 mg/dL  Glucose, capillary     Status: Abnormal   Collection Time: 05/19/16  7:30 AM  Result Value Ref Range   Glucose-Capillary 171 (H) 65 - 99 mg/dL  Troponin I     Status: None   Collection Time: 05/19/16 11:00 AM  Result Value Ref Range   Troponin I <0.03 <0.03 ng/mL  Procalcitonin - Baseline     Status: None   Collection Time: 05/19/16 11:00 AM  Result Value Ref Range   Procalcitonin <0.10 ng/mL    Comment:        Interpretation: PCT (Procalcitonin) <= 0.5 ng/mL: Systemic infection (sepsis) is not likely. Local bacterial infection is possible. (NOTE)         ICU PCT Algorithm               Non  ICU PCT Algorithm    ----------------------------     ------------------------------  PCT < 0.25 ng/mL                 PCT < 0.1 ng/mL     Stopping of antibiotics            Stopping of antibiotics       strongly encouraged.               strongly encouraged.    ----------------------------     ------------------------------       PCT level decrease by               PCT < 0.25 ng/mL       >= 80% from peak PCT       OR PCT 0.25 - 0.5 ng/mL          Stopping of antibiotics                                             encouraged.     Stopping of antibiotics           encouraged.    ----------------------------     ------------------------------       PCT level decrease by              PCT >= 0.25 ng/mL       < 80% from peak PCT        AND PCT >= 0.5 ng/mL            Continuin g antibiotics                                              encouraged.       Continuing antibiotics            encouraged.    ----------------------------     ------------------------------     PCT level increase compared          PCT > 0.5 ng/mL         with peak PCT AND          PCT >= 0.5 ng/mL             Escalation of antibiotics                                          strongly encouraged.      Escalation of antibiotics        strongly encouraged.   ECHOCARDIOGRAM COMPLETE     Status: None   Collection Time: 05/19/16 11:32 AM  Result Value Ref Range   Weight 3,440 oz   Height 67 in   BP 140/82 mmHg  Glucose, capillary     Status: Abnormal   Collection Time: 05/19/16 12:06 PM  Result Value Ref Range   Glucose-Capillary 179 (H) 65 - 99 mg/dL  Glucose, capillary     Status: Abnormal   Collection Time: 05/19/16  4:23 PM  Result Value Ref Range   Glucose-Capillary 145 (H) 65 - 99 mg/dL  Troponin I     Status: None   Collection Time: 05/19/16  5:13 PM  Result Value Ref Range   Troponin I <0.03 <0.03 ng/mL  Glucose, capillary     Status: Abnormal   Collection Time: 05/19/16  8:50 PM  Result  Value Ref Range   Glucose-Capillary 162 (H) 65 - 99 mg/dL  Glucose, capillary     Status: Abnormal   Collection Time: 05/20/16  7:26 AM  Result Value Ref Range   Glucose-Capillary 177 (H) 65 - 99 mg/dL  Glucose, capillary     Status: Abnormal   Collection Time: 05/20/16 11:24 AM  Result Value Ref Range   Glucose-Capillary 200 (H) 65 - 99 mg/dL  Protime-INR     Status: Abnormal   Collection Time: 05/20/16  2:06 PM  Result Value Ref Range   Prothrombin Time 19.6 (H) 11.4 - 15.2 seconds   INR 1.64   Glucose, capillary     Status: Abnormal   Collection Time: 05/20/16  4:32 PM  Result Value Ref Range   Glucose-Capillary 153 (H) 65 - 99 mg/dL  Glucose, capillary     Status: Abnormal   Collection Time: 05/20/16  8:52 PM  Result Value Ref Range   Glucose-Capillary 125 (H) 65 - 99 mg/dL  Glucose, capillary     Status: Abnormal   Collection Time: 05/21/16  2:48 AM  Result Value Ref Range   Glucose-Capillary 144 (H) 65 - 99 mg/dL  Procalcitonin     Status: None   Collection Time: 05/21/16  4:41 AM  Result Value Ref Range   Procalcitonin <0.10 ng/mL    Comment:        Interpretation: PCT (Procalcitonin) <= 0.5 ng/mL: Systemic infection (sepsis) is not likely. Local bacterial infection is possible. (NOTE)         ICU PCT Algorithm               Non ICU PCT Algorithm    ----------------------------     ------------------------------         PCT < 0.25 ng/mL                 PCT < 0.1 ng/mL     Stopping of antibiotics            Stopping of antibiotics       strongly encouraged.               strongly encouraged.    ----------------------------     ------------------------------       PCT level decrease by               PCT < 0.25 ng/mL       >= 80% from peak PCT       OR PCT 0.25 - 0.5 ng/mL          Stopping of antibiotics                                             encouraged.     Stopping of antibiotics           encouraged.    ----------------------------      ------------------------------       PCT level decrease by              PCT >= 0.25 ng/mL       < 80% from peak PCT        AND PCT >= 0.5 ng/mL            Continuin g antibiotics  encouraged.       Continuing antibiotics            encouraged.    ----------------------------     ------------------------------     PCT level increase compared          PCT > 0.5 ng/mL         with peak PCT AND          PCT >= 0.5 ng/mL             Escalation of antibiotics                                          strongly encouraged.      Escalation of antibiotics        strongly encouraged.   Protime-INR     Status: Abnormal   Collection Time: 05/21/16  4:41 AM  Result Value Ref Range   Prothrombin Time 22.0 (H) 11.4 - 15.2 seconds   INR 1.89   Glucose, capillary     Status: Abnormal   Collection Time: 05/21/16  7:41 AM  Result Value Ref Range   Glucose-Capillary 160 (H) 65 - 99 mg/dL  Glucose, capillary     Status: Abnormal   Collection Time: 05/21/16 12:14 PM  Result Value Ref Range   Glucose-Capillary 126 (H) 65 - 99 mg/dL  Glucose, capillary     Status: Abnormal   Collection Time: 05/21/16  4:50 PM  Result Value Ref Range   Glucose-Capillary 138 (H) 65 - 99 mg/dL  Glucose, capillary     Status: Abnormal   Collection Time: 05/21/16  9:20 PM  Result Value Ref Range   Glucose-Capillary 114 (H) 65 - 99 mg/dL   Comment 1 Notify RN    Comment 2 Document in Chart   Protime-INR     Status: Abnormal   Collection Time: 05/22/16  6:05 AM  Result Value Ref Range   Prothrombin Time 24.0 (H) 11.4 - 15.2 seconds   INR 9.81   Basic metabolic panel     Status: Abnormal   Collection Time: 05/22/16  6:05 AM  Result Value Ref Range   Sodium 138 135 - 145 mmol/L   Potassium 3.5 3.5 - 5.1 mmol/L   Chloride 104 101 - 111 mmol/L   CO2 24 22 - 32 mmol/L   Glucose, Bld 143 (H) 65 - 99 mg/dL   BUN 25 (H) 6 - 20 mg/dL   Creatinine, Ser 1.07 0.61 - 1.24 mg/dL    Calcium 8.6 (L) 8.9 - 10.3 mg/dL   GFR calc non Af Amer >60 >60 mL/min   GFR calc Af Amer >60 >60 mL/min    Comment: (NOTE) The eGFR has been calculated using the CKD EPI equation. This calculation has not been validated in all clinical situations. eGFR's persistently <60 mL/min signify possible Chronic Kidney Disease.    Anion gap 10 5 - 15  Glucose, capillary     Status: Abnormal   Collection Time: 05/22/16  7:31 AM  Result Value Ref Range   Glucose-Capillary 148 (H) 65 - 99 mg/dL   Comment 1 Notify RN   Glucose, capillary     Status: Abnormal   Collection Time: 05/22/16 11:37 AM  Result Value Ref Range   Glucose-Capillary 141 (H) 65 - 99 mg/dL   Comment 1 Notify RN     Assessment/Plan:  Hyperlipidemia lipid control  important in reducing the progression of atherosclerotic disease. Continue statin therapy   Type 2 diabetes mellitus treated with insulin (HCC) blood glucose control important in reducing the progression of atherosclerotic disease. Also, involved in wound healing. On appropriate medications.   Essential hypertension blood pressure control important in reducing the progression of atherosclerotic disease. On appropriate oral medications.   PAD (peripheral artery disease) ([place]) The patient's noninvasive studies today demonstrate a right ABI of 0.77 with a digital pressure of 62. The left ABI is noncompressible but the waveforms are monophasic and dampened in the digital pressure was significantly dampened compared to the right. Given his clinical scenario of ulceration and infection on the left foot, this is clearly a critical and limb threatening situation. I have discussed the pathophysiology and natural history of peripheral arterial disease. At this point, I have recommended an angiogram be performed for further evaluation and treatment. I have discussed the risks and benefits of this procedure. I have discussed the possibility that he could continue to have  wounds and pain even with better circulation. He will continue his local wound care as current. His angiogram will be scheduled for next week.      Leotis Pain 08/10/2016, 12:53 PM   This note was created with Dragon medical transcription system.  Any errors from dictation are unintentional.

## 2016-08-11 ENCOUNTER — Telehealth (INDEPENDENT_AMBULATORY_CARE_PROVIDER_SITE_OTHER): Payer: Self-pay

## 2016-08-11 ENCOUNTER — Encounter (INDEPENDENT_AMBULATORY_CARE_PROVIDER_SITE_OTHER): Payer: Self-pay

## 2016-08-11 NOTE — Telephone Encounter (Signed)
Due to patient's dye allergy Prednisone 50mg  #3 take one tablet 13 hours before, one tablet 7 hours before and 1 tablet day of procedure, Benadryl 25mg  #2, take one tablet day before and 1 tablet day of procedure, Zantac 150mg  #1 take one tablet day of procedure. This was called into patient's pharmacy. [facility] in [place].

## 2016-08-12 ENCOUNTER — Other Ambulatory Visit (INDEPENDENT_AMBULATORY_CARE_PROVIDER_SITE_OTHER): Payer: Self-pay | Admitting: Vascular Surgery

## 2016-08-13 ENCOUNTER — Encounter
Admission: RE | Admit: 2016-08-13 | Discharge: 2016-08-13 | Disposition: A | Discharge status: Home / Self Care | Payer: Medicare Other | Source: Ambulatory Visit | Attending: Vascular Surgery | Admitting: Vascular Surgery

## 2016-08-13 DIAGNOSIS — Z7902 Long term (current) use of antithrombotics/antiplatelets: Secondary | ICD-10-CM | POA: Diagnosis not present

## 2016-08-13 DIAGNOSIS — I509 Heart failure, unspecified: Secondary | ICD-10-CM | POA: Diagnosis not present

## 2016-08-13 DIAGNOSIS — I7025 Atherosclerosis of native arteries of other extremities with ulceration: Secondary | ICD-10-CM | POA: Diagnosis present

## 2016-08-13 DIAGNOSIS — I110 Hypertensive heart disease with heart failure: Secondary | ICD-10-CM | POA: Diagnosis not present

## 2016-08-13 DIAGNOSIS — Z794 Long term (current) use of insulin: Secondary | ICD-10-CM | POA: Diagnosis not present

## 2016-08-13 DIAGNOSIS — E119 Type 2 diabetes mellitus without complications: Secondary | ICD-10-CM | POA: Diagnosis not present

## 2016-08-13 DIAGNOSIS — N529 Male erectile dysfunction, unspecified: Secondary | ICD-10-CM | POA: Diagnosis not present

## 2016-08-13 DIAGNOSIS — Z87891 Personal history of nicotine dependence: Secondary | ICD-10-CM | POA: Diagnosis not present

## 2016-08-13 DIAGNOSIS — Z8546 Personal history of malignant neoplasm of prostate: Secondary | ICD-10-CM | POA: Diagnosis not present

## 2016-08-13 DIAGNOSIS — N393 Stress incontinence (female) (male): Secondary | ICD-10-CM | POA: Diagnosis not present

## 2016-08-13 DIAGNOSIS — L97529 Non-pressure chronic ulcer of other part of left foot with unspecified severity: Secondary | ICD-10-CM | POA: Diagnosis not present

## 2016-08-13 HISTORY — DX: Unspecified atrial fibrillation: I48.91

## 2016-08-13 HISTORY — DX: Pneumonia, unspecified organism: J18.9

## 2016-08-13 HISTORY — DX: Unspecified osteoarthritis, unspecified site: M19.90

## 2016-08-13 HISTORY — DX: Sleep apnea, unspecified: G47.30

## 2016-08-13 LAB — BUN: BUN: 29 mg/dL — ABNORMAL HIGH (ref 6–20)

## 2016-08-13 LAB — PROTIME-INR
INR: 2.17
Prothrombin Time: 24.5 seconds — ABNORMAL HIGH (ref 11.4–15.2)

## 2016-08-15 MED ORDER — CEFAZOLIN IN D5W 1 GM/50ML IV SOLN: 1.0000 g | Freq: Once | INTRAVENOUS | Status: DC

## 2016-08-15 MED ORDER — CLINDAMYCIN PHOSPHATE 300 MG/50ML IV SOLN: 300.0000 mg | Freq: Once | INTRAVENOUS | Status: DC

## 2016-08-16 ENCOUNTER — Ambulatory Visit
Admission: RE | Admit: 2016-08-16 | Discharge: 2016-08-16 | Disposition: A | Discharge status: Home / Self Care | Payer: Medicare Other | Source: Ambulatory Visit | Attending: Vascular Surgery | Admitting: Vascular Surgery

## 2016-08-16 ENCOUNTER — Encounter: Payer: Self-pay | Admitting: *Deleted

## 2016-08-16 ENCOUNTER — Encounter
Admission: RE | Disposition: A | Discharge status: Home / Self Care | Payer: Self-pay | Source: Ambulatory Visit | Attending: Vascular Surgery

## 2016-08-16 DIAGNOSIS — Z8546 Personal history of malignant neoplasm of prostate: Secondary | ICD-10-CM | POA: Insufficient documentation

## 2016-08-16 DIAGNOSIS — E119 Type 2 diabetes mellitus without complications: Secondary | ICD-10-CM | POA: Insufficient documentation

## 2016-08-16 DIAGNOSIS — I509 Heart failure, unspecified: Secondary | ICD-10-CM | POA: Insufficient documentation

## 2016-08-16 DIAGNOSIS — Z7902 Long term (current) use of antithrombotics/antiplatelets: Secondary | ICD-10-CM | POA: Insufficient documentation

## 2016-08-16 DIAGNOSIS — I70248 Atherosclerosis of native arteries of left leg with ulceration of other part of lower left leg: Secondary | ICD-10-CM | POA: Diagnosis not present

## 2016-08-16 DIAGNOSIS — Z87891 Personal history of nicotine dependence: Secondary | ICD-10-CM | POA: Insufficient documentation

## 2016-08-16 DIAGNOSIS — I7025 Atherosclerosis of native arteries of other extremities with ulceration: Secondary | ICD-10-CM | POA: Diagnosis not present

## 2016-08-16 DIAGNOSIS — Z794 Long term (current) use of insulin: Secondary | ICD-10-CM | POA: Insufficient documentation

## 2016-08-16 DIAGNOSIS — L97529 Non-pressure chronic ulcer of other part of left foot with unspecified severity: Secondary | ICD-10-CM | POA: Insufficient documentation

## 2016-08-16 DIAGNOSIS — N529 Male erectile dysfunction, unspecified: Secondary | ICD-10-CM | POA: Insufficient documentation

## 2016-08-16 DIAGNOSIS — I110 Hypertensive heart disease with heart failure: Secondary | ICD-10-CM | POA: Insufficient documentation

## 2016-08-16 DIAGNOSIS — N393 Stress incontinence (female) (male): Secondary | ICD-10-CM | POA: Insufficient documentation

## 2016-08-16 HISTORY — PX: LOWER EXTREMITY ANGIOGRAPHY: CATH118251

## 2016-08-16 LAB — BASIC METABOLIC PANEL
ANION GAP: 11 (ref 5–15)
BUN: 28 mg/dL — AB (ref 6–20)
CHLORIDE: 106 mmol/L (ref 101–111)
CO2: 23 mmol/L (ref 22–32)
Calcium: 9.3 mg/dL (ref 8.9–10.3)
Creatinine, Ser: 1.32 mg/dL — ABNORMAL HIGH (ref 0.61–1.24)
GFR, EST AFRICAN AMERICAN: 58 mL/min — AB (ref >60)
GFR, EST NON AFRICAN AMERICAN: 50 mL/min — AB (ref >60)
Glucose, Bld: 323 mg/dL — ABNORMAL HIGH (ref 65–99)
POTASSIUM: 4.1 mmol/L (ref 3.5–5.1)
SODIUM: 140 mmol/L (ref 135–145)

## 2016-08-16 LAB — PROTIME-INR
INR: 1.4
PROTHROMBIN TIME: 17.3 s — AB (ref 11.4–15.2)

## 2016-08-16 SURGERY — LOWER EXTREMITY ANGIOGRAPHY
Anesthesia: Moderate Sedation | Laterality: Left

## 2016-08-16 MED ORDER — MIDAZOLAM HCL 5 MG/5ML IJ SOLN
INTRAMUSCULAR | Status: AC
Start: 2016-08-16 — End: 2016-08-16
  Filled 2016-08-16: qty 5

## 2016-08-16 MED ORDER — FAMOTIDINE 20 MG PO TABS: 40.0000 mg | ORAL_TABLET | ORAL | Status: DC | PRN

## 2016-08-16 MED ORDER — ASPIRIN EC 81 MG PO TBEC: 81.0000 mg | DELAYED_RELEASE_TABLET | Freq: Every day | ORAL | 2 refills | Status: DC

## 2016-08-16 MED ORDER — HYDROMORPHONE HCL 1 MG/ML IJ SOLN: 1.0000 mg | Freq: Once | INTRAMUSCULAR | Status: DC

## 2016-08-16 MED ORDER — HEPARIN (PORCINE) IN NACL 2-0.9 UNIT/ML-% IJ SOLN
INTRAMUSCULAR | Status: AC
  Filled 2016-08-16: qty 1000

## 2016-08-16 MED ORDER — IOPAMIDOL (ISOVUE-300) INJECTION 61%
INTRAVENOUS | Status: DC | PRN
  Administered 2016-08-16: 65 mL via INTRAVENOUS

## 2016-08-16 MED ORDER — ONDANSETRON HCL 4 MG/2ML IJ SOLN: 4.0000 mg | Freq: Four times a day (QID) | INTRAMUSCULAR | Status: DC | PRN

## 2016-08-16 MED ORDER — MIDAZOLAM HCL 2 MG/2ML IJ SOLN
INTRAMUSCULAR | Status: DC | PRN
  Administered 2016-08-16: 2 mg via INTRAVENOUS
  Administered 2016-08-16 (×2): 1 mg via INTRAVENOUS

## 2016-08-16 MED ORDER — LIDOCAINE-EPINEPHRINE (PF) 2 %-1:200000 IJ SOLN
INTRAMUSCULAR | Status: AC
  Filled 2016-08-16: qty 20

## 2016-08-16 MED ORDER — METHYLPREDNISOLONE SODIUM SUCC 125 MG IJ SOLR
125.0000 mg | INTRAMUSCULAR | Status: DC | PRN
  Administered 2016-08-16: 125 mg via INTRAVENOUS

## 2016-08-16 MED ORDER — FENTANYL CITRATE (PF) 100 MCG/2ML IJ SOLN
INTRAMUSCULAR | Status: AC
  Filled 2016-08-16: qty 2

## 2016-08-16 MED ORDER — METHYLPREDNISOLONE SODIUM SUCC 125 MG IJ SOLR
INTRAMUSCULAR | Status: DC
Start: 2016-08-16 — End: 2016-08-16
  Filled 2016-08-16: qty 2

## 2016-08-16 MED ORDER — HEPARIN SODIUM (PORCINE) 1000 UNIT/ML IJ SOLN
INTRAMUSCULAR | Status: DC | PRN
  Administered 2016-08-16: 5000 [IU] via INTRAVENOUS

## 2016-08-16 MED ORDER — FENTANYL CITRATE (PF) 100 MCG/2ML IJ SOLN
INTRAMUSCULAR | Status: DC | PRN
  Administered 2016-08-16: 25 ug via INTRAVENOUS
  Administered 2016-08-16: 50 ug via INTRAVENOUS
  Administered 2016-08-16: 25 ug via INTRAVENOUS

## 2016-08-16 MED ORDER — DIPHENHYDRAMINE HCL 50 MG/ML IJ SOLN
INTRAMUSCULAR | Status: AC
  Filled 2016-08-16: qty 1

## 2016-08-16 MED ORDER — BACITRACIN-NEOMYCIN-POLYMYXIN 400-5-5000 EX OINT
TOPICAL_OINTMENT | CUTANEOUS | Status: AC
  Filled 2016-08-16: qty 1

## 2016-08-16 MED ORDER — DIPHENHYDRAMINE HCL 50 MG/ML IJ SOLN
INTRAMUSCULAR | Status: DC | PRN
Start: 2016-08-16 — End: 2016-08-16
  Administered 2016-08-16: 50 mg via INTRAVENOUS

## 2016-08-16 MED ORDER — HEPARIN SODIUM (PORCINE) 1000 UNIT/ML IJ SOLN
INTRAMUSCULAR | Status: AC
  Filled 2016-08-16: qty 1

## 2016-08-16 MED ORDER — SODIUM CHLORIDE 0.9 % IV SOLN: INTRAVENOUS | Status: DC

## 2016-08-16 SURGICAL SUPPLY — 25 items
BALLN DORADO 6X40X80 (BALLOONS) ×2
BALLN LUTONIX 5X150X130 (BALLOONS) ×2
BALLN ULTRVRSE 2.5X300X150 (BALLOONS) ×2
BALLN ULTRVRSE 2X150X150 (BALLOONS) ×2
BALLN ULTRVRSE 6X100X130C (BALLOONS) ×2
BALLOON DORADO 6X40X80 (BALLOONS) ×1 IMPLANT
BALLOON LUTONIX 5X150X130 (BALLOONS) ×1 IMPLANT
BALLOON ULTRVRSE 2.5X300X150 (BALLOONS) ×1 IMPLANT
BALLOON ULTRVRSE 2X150X150 (BALLOONS) ×1 IMPLANT
BALLOON ULTRVRSE 6X100X130C (BALLOONS) ×1 IMPLANT
CATH 5FR PIG 90CM (CATHETERS) ×2 IMPLANT
CATH CXI SUPP ANG 4FR 135 (MICROCATHETER) ×1 IMPLANT
CATH CXI SUPP ANG 4FR 135CM (MICROCATHETER) ×2
CATH VERT 100CM (CATHETERS) ×2 IMPLANT
DEVICE PRESTO INFLATION (MISCELLANEOUS) ×2 IMPLANT
DEVICE STARCLOSE SE CLOSURE (Vascular Products) ×2 IMPLANT
GLIDEWIRE ADV .035X260CM (WIRE) ×2 IMPLANT
LIFESTENT 6X100X130 (Permanent Stent) ×2 IMPLANT
PACK ANGIOGRAPHY (CUSTOM PROCEDURE TRAY) ×2 IMPLANT
SHEATH ANL2 6FRX45 HC (SHEATH) ×2 IMPLANT
SHEATH BRITE TIP 5FRX11 (SHEATH) ×2 IMPLANT
TOWEL OR 17X26 4PK STRL BLUE (TOWEL DISPOSABLE) ×2 IMPLANT
WIRE G V18X300CM (WIRE) ×2 IMPLANT
WIRE J 3MM .035X145CM (WIRE) ×2 IMPLANT
WIRE SPARTACORE .014X300CM (WIRE) ×2 IMPLANT

## 2016-08-16 NOTE — H&P (Signed)
[facility] History & Physical Update  The patient was interviewed and re-examined.  The patient's previous History and Physical has been reviewed and is unchanged.  There is no change in the plan of care. We plan to proceed with the scheduled procedure.  Leotis Pain, MD  08/16/2016, 10:31 AM

## 2016-08-16 NOTE — Op Note (Signed)
[facility] Percutaneous Study/Intervention Procedural Note   Date of Surgery: 08/16/2016  Surgeon(s):Dhalia Zingaro   Assistants:none  Pre-operative Diagnosis: PAD with ulceration left lower extremity  Post-operative diagnosis: Same  Procedure(s) Performed: 1. Ultrasound guidance for vascular access right femoral artery 2. Catheter placement into left posterior tibial artery from right femoral approach 3. Aortogram and selective left lower extremity angiogram 4. Percutaneous transluminal angioplasty of left posterior tibial artery with 2 mm diameter angioplasty balloon at the foot and ankle, and 2.5 mm diameter angioplasty balloon from the ankle up through the tibioperoneal trunk 5. Percutaneous transluminal angioplasty of the mid to distal left SFA with 5 mm diameter by 15 cm length Lutonix drug-coated angioplasty balloon  6.  Self-expanding stent placement to the mid to distal left SFA with 6 mm diameter by 10 cm length life stent 7. StarClose closure device right femoral artery  EBL: 5 cc  Contrast: 35 cc  Fluoro Time: 9.4 minutes  Moderate Conscious Sedation Time: approximately 75 minutes using 4 mg of Versed and 100 mcg of Fentanyl  Indications: Patient is a 82 y.o.male with pain and nonhealing ulcerations of the left foot. The patient has noninvasive study showing noncompressible ABIs with poor waveforms and reduced digital pressures worse on the left than the right. The patient is brought in for angiography for further evaluation and potential treatment. Risks and benefits are discussed and informed consent is obtained  Procedure: The patient was identified and appropriate procedural time out was performed. The patient was then placed supine on the table and prepped and draped in the usual sterile fashion.Moderate conscious sedation was administered during a  face to face encounter with the patient throughout the procedure with my supervision of the RN administering medicines and monitoring the patient's vital signs, pulse oximetry, telemetry and mental status throughout from the start of the procedure until the patient was taken to the recovery room. Ultrasound was used to evaluate the right common femoral artery. It was patent . A digital ultrasound image was acquired. A Seldinger needle was used to access the right common femoral artery under direct ultrasound guidance and a permanent image was performed. A 0.035 J wire was advanced without resistance and a 5Fr sheath was placed. Pigtail catheter was placed into the aorta and an AP aortogram was performed. This demonstrated normal renal arteries and normal aorta and iliac segments without significant stenosis. I then crossed the aortic bifurcation and advanced to the left femoral head. Selective left lower extremity angiogram was then performed. This demonstrated a normal common femoral artery, profunda femoris artery, and proximal superficial femoral artery. The mid to distal superficial femoral artery had a very calcific short segment occlusion with reconstitution just above Hunter's canal. There was then a peroneal artery which was continuous distally. The posterior tibial artery had multiple areas of stenosis and then occluded in the mid to distal posterior tibial artery but did reconstitute in the foot. The anterior tibial artery was occluded distally without reconstitution identified. The patient was systemically heparinized and a 6 [place] Ansell sheath was then placed over the [employer] wire. I then used a Kumpe catheter and the advantage wire to navigate through the SFA occlusion and confirm intraluminal flow in the popliteal artery. The catheter would not track easily, and I had to exchange for a CXI catheter. This better opacified the tibial vessels with the above findings. I then advanced a  0.018 wire all the way across the posterior tibial artery occlusion and into the  foot. I was able to get the 0.018 2-1/2 mm balloon down to the distal posterior tibial artery just above the ankle, but this would not track across the occlusion. This was inflated to 12 atm for 1 minute to treat the entirety of the posterior tibial artery from just above the ankle up to the tibioperoneal trunk. I then exchanged for a 0.014 wire and was able to get a 2 mm diameter by 15 cm length angioplasty balloon on the way into the foot across the occlusion. This was inflated to 8 atm for 1 minute. Completion angiogram showed residual occlusion in the foot, but the posterior tibial artery down to the foot and ankle was now continuous without greater than 20% stenosis identified. Some of the poor flow in the foot could be spasm, but he still had a continuous peroneal artery and I turned my attention towards improving the inflow. The lesion in the mid to distal superficial femoral artery was treated with a 5 mm diameter by 15 cm length Lutonix drug-coated angioplasty balloon. This was inflated to 14 atm for 1 minute. Completion angiogram still showed a high-grade residual stenosis in the 75-80% range which was calcific. A 6 mm diameter by 10 cm length stent was then deployed in the mid to distal left SFA and postdilated with a 6 mm balloon. I actually had to use a high pressure balloon inflated this to almost 30 atm to completely open the stent. Following this, there was less than 20% residual stenosis in the SFA. I elected to terminate the procedure. The sheath was removed and StarClose closure device was deployed in the right femoral artery with excellent hemostatic result. The patient was taken to the recovery room in stable condition having tolerated the procedure well.  Findings:  Aortogram: Normal left renal artery, right renal artery not well seen in its origin but appeared to be normal. The aorta and iliac  arteries were normal without significant stenosis Left Lower Extremity: Normal common femoral artery, profunda femoris artery, and proximal superficial femoral artery. The mid to distal superficial femoral artery had a very calcific short segment occlusion with reconstitution just above Hunter's canal. There was then a peroneal artery which was continuous distally. The posterior tibial artery had multiple areas of stenosis and then occluded in the mid to distal posterior tibial artery but did reconstitute in the foot. The anterior tibial artery was occluded distally without reconstitution identified.   Disposition: Patient was taken to the recovery room in stable condition having tolerated the procedure well.  Complications: None  Leotis Pain 08/16/2016 1:17 PM   This note was created with Dragon Medical transcription system. Any errors in dictation are purely unintentional.

## 2016-08-16 NOTE — Progress Notes (Signed)
Pt stating he has taken prednisone along with pepcid at home prior to coming for procedure.

## 2016-08-16 NOTE — Progress Notes (Signed)
Patient here today for lower extremity angiogram.per Dr. Lucky Cowboy. Will monitor vitals as well as etco2 during and throughout entire procedure.

## 2016-08-17 ENCOUNTER — Encounter: Payer: Self-pay | Admitting: Vascular Surgery

## 2016-08-26 ENCOUNTER — Encounter (INDEPENDENT_AMBULATORY_CARE_PROVIDER_SITE_OTHER): Payer: Self-pay | Admitting: Vascular Surgery

## 2016-08-26 ENCOUNTER — Other Ambulatory Visit (INDEPENDENT_AMBULATORY_CARE_PROVIDER_SITE_OTHER): Payer: Self-pay | Admitting: Vascular Surgery

## 2016-08-26 ENCOUNTER — Ambulatory Visit (INDEPENDENT_AMBULATORY_CARE_PROVIDER_SITE_OTHER): Payer: Medicare Other | Admitting: Vascular Surgery

## 2016-08-26 ENCOUNTER — Encounter (INDEPENDENT_AMBULATORY_CARE_PROVIDER_SITE_OTHER): Payer: Self-pay

## 2016-08-26 VITALS — BP 157/90 | HR 78 | Resp 17 | Wt 206.0 lb

## 2016-08-26 DIAGNOSIS — E119 Type 2 diabetes mellitus without complications: Secondary | ICD-10-CM

## 2016-08-26 DIAGNOSIS — I739 Peripheral vascular disease, unspecified: Secondary | ICD-10-CM

## 2016-08-26 DIAGNOSIS — E785 Hyperlipidemia, unspecified: Secondary | ICD-10-CM | POA: Diagnosis not present

## 2016-08-26 DIAGNOSIS — Z794 Long term (current) use of insulin: Secondary | ICD-10-CM

## 2016-08-26 NOTE — Progress Notes (Signed)
Subjective:    Patient ID: [name], male    DOB: 12-May-1935, 82 y.o.   MRN: [id number] Chief Complaint  Patient presents with  . Wound Check   Patient presents at the request of Dr. Elvina Mattes. Request states "worsening left lower extremity wounds". The patient is s/p a left lower extremity angiogram with left SFA stent and PTA of tibial arteries for PAD with ulceration to the LLE. Patient has seen Dr. Elvina Mattes s/p intervention who states wounds are worsening. Patient was scheduled to follow up in our office with an ABI 10/18/16. Patient presents with worsening left foot pain. Denies any fever, nausea or vomiting. Using orthotic shoe.    Review of Systems  Constitutional: Negative.   HENT: Negative.   Eyes: Negative.   Respiratory: Negative.   Cardiovascular: Positive for leg swelling.  Gastrointestinal: Negative.   Endocrine: Negative.   Genitourinary: Negative.   Musculoskeletal: Negative.   Skin: Positive for wound.  Allergic/Immunologic: Negative.   Neurological: Negative.   Hematological: Negative.   Psychiatric/Behavioral: Negative.       Objective:   Physical Exam  Constitutional: He is oriented to person, place, and time. He appears well-developed and well-nourished. No distress.  HENT:  Head: Normocephalic and atraumatic.  Eyes: Pupils are equal, round, and reactive to light.  Neck: Normal range of motion.  Cardiovascular: Normal rate, regular rhythm, normal heart sounds and intact distal pulses.   Pulses:      Radial pulses are 2+ on the right side, and 2+ on the left side.       Dorsalis pedis pulses are 1+ on the right side, and 0 on the left side.       Posterior tibial pulses are 1+ on the right side, and 0 on the left side.  Unable to palpate left pedal pulse. Foot is warm to mid-foot. Bluish tint to toes.   Pulmonary/Chest: Effort normal.  Musculoskeletal: Normal range of motion. He exhibits edema (Mild left foot edema).  Neurological: He is alert and  oriented to person, place, and time.  Skin: He is not diaphoretic.     Left Foot: fifth toe ulceration, limited to skin. 4th toe necrosis noted.  Bottom of foot: necrosis area midfoot. Wound forming under first toe.   Psychiatric: He has a normal mood and affect. His behavior is normal. Judgment normal.  Vitals reviewed.  BP (!) 157/90   Pulse 78   Resp 17   Wt 206 lb (93.4 kg)   BMI 32.26 kg/m   Past Medical History:  Diagnosis Date  . Arthritis   . Atrial fibrillation ([place])   . Carcinoma of prostate ([place])   . CHF (congestive heart failure) ([place])   . Diabetes mellitus without complication ([place])   . ED (erectile dysfunction)   . Elevated blood pressure   . Frequent urination   . Hypertension   . Pneumonia 04/2016  . Prostate cancer ([place])   . Sleep apnea    OSA--USE C-PAP  . Urinary stress incontinence, male    Social History   Social History  . Marital status: Married    Spouse name: N/A  . Number of children: N/A  . Years of education: N/A   Occupational History  . retired    Social History Main Topics  . Smoking status: Former Smoker    Packs/day: 0.25    Types: Cigarettes    Quit date: 10/14/1994  . Smokeless tobacco: Never Used  . Alcohol use No  . Drug  use: No  . Sexual activity: Not on file   Other Topics Concern  . Not on file   Social History Narrative  . No narrative on file   Past Surgical History:  Procedure Laterality Date  . CHOLECYSTECTOMY    . EYE SURGERY Bilateral    Cataract Extraction with IOL  . LOWER EXTREMITY ANGIOGRAPHY Left 08/16/2016   Procedure: Lower Extremity Angiography;  Surgeon: Algernon Huxley, MD;  Location: [facility];  Service: Cardiovascular;  Laterality: Left;  . PROSTATE SURGERY     removal  . TONSILLECTOMY     as a child   Family History  Problem Relation Age of Onset  . Hypertension Mother   . Prostate cancer Neg Hx   . Bladder Cancer Neg Hx   . Kidney disease Neg Hx    Allergies  Allergen  Reactions  . Contrast Media [Iodinated Diagnostic Agents]     SOB  . Iohexol      Desc: Respiratory Distress, Laryngedema, diaphoresis, Onset Date: [id number]   . Latex Rash      Assessment & Plan:  Patient presents at the request of Dr. Elvina Mattes. Request states "worsening left lower extremity wounds". The patient is s/p a left lower extremity angiogram with left SFA stent and PTA of tibial arteries for PAD with ulceration to the LLE. Patient has seen Dr. Elvina Mattes s/p intervention who states wounds are worsening. Patient was scheduled to follow up in our office with an ABI 10/18/16. Patient presents with worsening left foot pain. Denies any fever, nausea or vomiting. Using orthotic shoe.   1. Hyperlipidemia, unspecified hyperlipidemia type - Stable Encouraged good control as its slows the progression of atherosclerotic disease  2. Type 2 diabetes mellitus treated with insulin ([facility]) - Stable Encouraged good control as its slows the progression of atherosclerotic disease  3. PAD (peripheral artery disease) ([facility]) - Worsening Patient with severe PAD of the LLE s/p LLE endovascular intervention with stent and angioplasty. Unable to obtain ABI in office. Unable to palpate pedal pulses. New ulcer development. Worrisome for possible failure of previous intervention or microvascular disease. Recommend repeat LLE angiogram with possible intervention. Procedure, risks and benefits explained to patient. All questions answered. Patient wishes to proceed. Empiric Keflex rx'd. 551m PO q6h x40 Percocet for pain. 09/3323 one tab po q6 hours as needed for pain  Current Outpatient Prescriptions on File Prior to Visit  Medication Sig Dispense Refill  . amLODipine-benazepril (LOTREL) 5-20 MG capsule Take 1 capsule by mouth daily.    .Marland Kitchenaspirin EC 81 MG tablet Take 1 tablet (81 mg total) by mouth daily. 150 tablet 2  . atorvastatin (LIPITOR) 80 MG tablet Take 80 mg by mouth daily at 6 PM.     . Blood  Glucose Monitoring Suppl (FIFTY50 GLUCOSE METER 2.0) w/Device KIT Use as directed.    . Blood Glucose Monitoring Suppl (ONE TOUCH ULTRA 2) W/DEVICE KIT Use as directed.    . diltiazem (CARDIZEM CD) 300 MG 24 hr capsule Take 1 capsule (300 mg total) by mouth daily. 30 capsule 1  . diphenhydrAMINE (BENADRYL) 25 mg capsule Take 25 mg by mouth. Sunday night before procedure, and the day of procedure before coming to hospital.    . furosemide (LASIX) 20 MG tablet TAKE 1 TABLET DAILY    . glucose blood (ONE TOUCH ULTRA TEST) test strip Use 2 (two) times daily. Use as instructed.    .Marland KitchenHUMALOG KWIKPEN 100 UNIT/ML KiwkPen Inject 6 Units into the  skin 2 (two) times daily.     Marland Kitchen JARDIANCE 10 MG TABS tablet Take 1 tablet by mouth daily.    . Lancets 30G MISC Use 1 Device 2 (two) times daily.    . metFORMIN (GLUCOPHAGE) 1000 MG tablet TAKE 1 TABLET TWICE A DAY WITH MEALS    . metoprolol succinate (TOPROL-XL) 100 MG 24 hr tablet Take 100 mg by mouth daily. Take with or immediately following a meal.    . mupirocin ointment (BACTROBAN) 2 % Place 1 application into the nose 2 (two) times daily. Apply To left foot    . predniSONE (DELTASONE) 50 MG tablet Take 50 mg by mouth. Take one tablet by mouth 13 hours  before procedure ,one tablet seven hours before procedure, and one tablet the day of procedure    . ranitidine (ZANTAC) 150 MG tablet Take 150 mg by mouth once. The day of procedure    . terbinafine (LAMISIL) 1 % cream Apply 1 application topically 2 (two) times daily. Apply to left foot    . TRULICITY 1.5 WU/1.3KG SOPN Inject 1.5 mg into the muscle once a week.     . valsartan (DIOVAN) 320 MG tablet TAKE 1 TABLET DAILY    . warfarin (COUMADIN) 5 MG tablet Take 5 mg by mouth daily.     No current facility-administered medications on file prior to visit.     There are no Patient Instructions on file for this visit. No Follow-up on file.   Kaleyah Labreck A Zoria Rawlinson, PA-C

## 2016-08-27 ENCOUNTER — Encounter
Admission: RE | Admit: 2016-08-27 | Discharge: 2016-08-27 | Disposition: A | Discharge status: Home / Self Care | Payer: Medicare Other | Source: Ambulatory Visit | Attending: Vascular Surgery | Admitting: Vascular Surgery

## 2016-08-27 ENCOUNTER — Inpatient Hospital Stay: Admission: RE | Admit: 2016-08-27 | Payer: Medicare Other | Source: Ambulatory Visit

## 2016-08-27 DIAGNOSIS — Z79891 Long term (current) use of opiate analgesic: Secondary | ICD-10-CM | POA: Insufficient documentation

## 2016-08-27 DIAGNOSIS — I509 Heart failure, unspecified: Secondary | ICD-10-CM | POA: Insufficient documentation

## 2016-08-27 DIAGNOSIS — I739 Peripheral vascular disease, unspecified: Secondary | ICD-10-CM | POA: Insufficient documentation

## 2016-08-27 DIAGNOSIS — I10 Essential (primary) hypertension: Secondary | ICD-10-CM | POA: Diagnosis not present

## 2016-08-27 DIAGNOSIS — Z01812 Encounter for preprocedural laboratory examination: Secondary | ICD-10-CM | POA: Insufficient documentation

## 2016-08-27 DIAGNOSIS — E785 Hyperlipidemia, unspecified: Secondary | ICD-10-CM | POA: Insufficient documentation

## 2016-08-27 DIAGNOSIS — E119 Type 2 diabetes mellitus without complications: Secondary | ICD-10-CM | POA: Insufficient documentation

## 2016-08-27 DIAGNOSIS — Z79899 Other long term (current) drug therapy: Secondary | ICD-10-CM | POA: Diagnosis not present

## 2016-08-27 DIAGNOSIS — Z794 Long term (current) use of insulin: Secondary | ICD-10-CM | POA: Diagnosis not present

## 2016-08-27 DIAGNOSIS — Z91040 Latex allergy status: Secondary | ICD-10-CM | POA: Diagnosis not present

## 2016-08-27 DIAGNOSIS — Z8546 Personal history of malignant neoplasm of prostate: Secondary | ICD-10-CM | POA: Insufficient documentation

## 2016-08-27 DIAGNOSIS — Z87891 Personal history of nicotine dependence: Secondary | ICD-10-CM | POA: Diagnosis not present

## 2016-08-27 DIAGNOSIS — Z9109 Other allergy status, other than to drugs and biological substances: Secondary | ICD-10-CM | POA: Insufficient documentation

## 2016-08-27 HISTORY — DX: Peripheral vascular disease, unspecified: I73.9

## 2016-08-27 LAB — CREATININE, SERUM
CREATININE: 1.22 mg/dL (ref 0.61–1.24)
GFR calc Af Amer: >60 mL/min (ref >60)
GFR calc non Af Amer: 55 mL/min — ABNORMAL LOW (ref >60)

## 2016-08-27 LAB — BUN: BUN: 23 mg/dL — ABNORMAL HIGH (ref 6–20)

## 2016-08-27 NOTE — Pre-Procedure Instructions (Signed)
Patient nauseated and vomiting.  Sweating.

## 2016-08-29 MED ORDER — CEFAZOLIN SODIUM-DEXTROSE 2-4 GM/100ML-% IV SOLN
2.0000 g | Freq: Once | INTRAVENOUS | Status: DC
  Filled 2016-08-29: qty 100

## 2016-08-30 ENCOUNTER — Other Ambulatory Visit: Payer: Self-pay

## 2016-08-30 ENCOUNTER — Encounter
Admission: AD | Disposition: A | Discharge status: Home / Self Care | Payer: Self-pay | Source: Ambulatory Visit | Attending: Internal Medicine

## 2016-08-30 ENCOUNTER — Observation Stay
Admission: AD | Admit: 2016-08-30 | Discharge: 2016-09-06 | Disposition: A | Discharge status: Home / Self Care | Payer: Medicare Other | Source: Ambulatory Visit | Attending: Internal Medicine | Admitting: Internal Medicine

## 2016-08-30 DIAGNOSIS — I110 Hypertensive heart disease with heart failure: Secondary | ICD-10-CM | POA: Diagnosis not present

## 2016-08-30 DIAGNOSIS — G4730 Sleep apnea, unspecified: Secondary | ICD-10-CM | POA: Insufficient documentation

## 2016-08-30 DIAGNOSIS — Z794 Long term (current) use of insulin: Secondary | ICD-10-CM | POA: Insufficient documentation

## 2016-08-30 DIAGNOSIS — Z888 Allergy status to other drugs, medicaments and biological substances status: Secondary | ICD-10-CM | POA: Insufficient documentation

## 2016-08-30 DIAGNOSIS — I509 Heart failure, unspecified: Secondary | ICD-10-CM | POA: Insufficient documentation

## 2016-08-30 DIAGNOSIS — Z91041 Radiographic dye allergy status: Secondary | ICD-10-CM | POA: Diagnosis not present

## 2016-08-30 DIAGNOSIS — Z9049 Acquired absence of other specified parts of digestive tract: Secondary | ICD-10-CM | POA: Insufficient documentation

## 2016-08-30 DIAGNOSIS — L97529 Non-pressure chronic ulcer of other part of left foot with unspecified severity: Secondary | ICD-10-CM | POA: Insufficient documentation

## 2016-08-30 DIAGNOSIS — Z8546 Personal history of malignant neoplasm of prostate: Secondary | ICD-10-CM | POA: Insufficient documentation

## 2016-08-30 DIAGNOSIS — I4891 Unspecified atrial fibrillation: Secondary | ICD-10-CM | POA: Diagnosis not present

## 2016-08-30 DIAGNOSIS — Z9889 Other specified postprocedural states: Secondary | ICD-10-CM | POA: Insufficient documentation

## 2016-08-30 DIAGNOSIS — D72829 Elevated white blood cell count, unspecified: Secondary | ICD-10-CM

## 2016-08-30 DIAGNOSIS — I482 Chronic atrial fibrillation, unspecified: Secondary | ICD-10-CM | POA: Diagnosis present

## 2016-08-30 DIAGNOSIS — R531 Weakness: Secondary | ICD-10-CM

## 2016-08-30 DIAGNOSIS — M79605 Pain in left leg: Secondary | ICD-10-CM | POA: Diagnosis not present

## 2016-08-30 DIAGNOSIS — Z8249 Family history of ischemic heart disease and other diseases of the circulatory system: Secondary | ICD-10-CM | POA: Insufficient documentation

## 2016-08-30 DIAGNOSIS — Z87891 Personal history of nicotine dependence: Secondary | ICD-10-CM | POA: Diagnosis not present

## 2016-08-30 DIAGNOSIS — N529 Male erectile dysfunction, unspecified: Secondary | ICD-10-CM | POA: Insufficient documentation

## 2016-08-30 DIAGNOSIS — N393 Stress incontinence (female) (male): Secondary | ICD-10-CM | POA: Diagnosis not present

## 2016-08-30 DIAGNOSIS — I70245 Atherosclerosis of native arteries of left leg with ulceration of other part of foot: Secondary | ICD-10-CM | POA: Diagnosis not present

## 2016-08-30 DIAGNOSIS — Z8679 Personal history of other diseases of the circulatory system: Secondary | ICD-10-CM

## 2016-08-30 DIAGNOSIS — E785 Hyperlipidemia, unspecified: Secondary | ICD-10-CM | POA: Insufficient documentation

## 2016-08-30 DIAGNOSIS — E119 Type 2 diabetes mellitus without complications: Secondary | ICD-10-CM | POA: Diagnosis not present

## 2016-08-30 DIAGNOSIS — I70248 Atherosclerosis of native arteries of left leg with ulceration of other part of lower left leg: Secondary | ICD-10-CM | POA: Diagnosis not present

## 2016-08-30 DIAGNOSIS — M1990 Unspecified osteoarthritis, unspecified site: Secondary | ICD-10-CM | POA: Insufficient documentation

## 2016-08-30 DIAGNOSIS — Z9079 Acquired absence of other genital organ(s): Secondary | ICD-10-CM | POA: Insufficient documentation

## 2016-08-30 DIAGNOSIS — Z91040 Latex allergy status: Secondary | ICD-10-CM | POA: Diagnosis not present

## 2016-08-30 DIAGNOSIS — N179 Acute kidney failure, unspecified: Secondary | ICD-10-CM

## 2016-08-30 LAB — BASIC METABOLIC PANEL
ANION GAP: 11 (ref 5–15)
BUN: 31 mg/dL — ABNORMAL HIGH (ref 6–20)
CHLORIDE: 103 mmol/L (ref 101–111)
CO2: 22 mmol/L (ref 22–32)
Calcium: 8.8 mg/dL — ABNORMAL LOW (ref 8.9–10.3)
Creatinine, Ser: 1.43 mg/dL — ABNORMAL HIGH (ref 0.61–1.24)
GFR calc non Af Amer: 45 mL/min — ABNORMAL LOW (ref >60)
GFR, EST AFRICAN AMERICAN: 52 mL/min — AB (ref >60)
Glucose, Bld: 372 mg/dL — ABNORMAL HIGH (ref 65–99)
Potassium: 4.3 mmol/L (ref 3.5–5.1)
Sodium: 136 mmol/L (ref 135–145)

## 2016-08-30 LAB — GLUCOSE, CAPILLARY
GLUCOSE-CAPILLARY: 491 mg/dL — AB (ref 65–99)
GLUCOSE-CAPILLARY: 251 mg/dL — AB (ref 65–99)
Glucose-Capillary: 252 mg/dL — ABNORMAL HIGH (ref 65–99)

## 2016-08-30 LAB — CBC
HEMATOCRIT: 47.4 % (ref 40.0–52.0)
HEMOGLOBIN: 15.6 g/dL (ref 13.0–18.0)
MCH: 29.3 pg (ref 26.0–34.0)
MCHC: 32.9 g/dL (ref 32.0–36.0)
MCV: 88.9 fL (ref 80.0–100.0)
Platelets: 224 10*3/uL (ref 150–440)
RBC: 5.32 MIL/uL (ref 4.40–5.90)
RDW: 14.9 % — ABNORMAL HIGH (ref 11.5–14.5)
WBC: 12.9 10*3/uL — ABNORMAL HIGH (ref 3.8–10.6)

## 2016-08-30 SURGERY — LOWER EXTREMITY ANGIOGRAPHY
Anesthesia: Moderate Sedation | Laterality: Left

## 2016-08-30 MED ORDER — IRBESARTAN 75 MG PO TABS
37.5000 mg | ORAL_TABLET | Freq: Every day | ORAL | Status: DC
Start: 2016-08-31 — End: 2016-09-06
  Administered 2016-08-31 – 2016-09-06 (×7): 37.5 mg via ORAL
  Filled 2016-08-30: qty 1
  Filled 2016-08-30: qty 2
  Filled 2016-08-30: qty 0.5
  Filled 2016-08-30 (×6): qty 1

## 2016-08-30 MED ORDER — ONDANSETRON HCL 4 MG PO TABS: 4.0000 mg | ORAL_TABLET | Freq: Four times a day (QID) | ORAL | Status: DC | PRN

## 2016-08-30 MED ORDER — HYDROMORPHONE HCL 1 MG/ML IJ SOLN: 1.0000 mg | Freq: Once | INTRAMUSCULAR | Status: DC | PRN

## 2016-08-30 MED ORDER — DULAGLUTIDE 1.5 MG/0.5ML ~~LOC~~ SOAJ: 1.5000 mg | SUBCUTANEOUS | Status: DC

## 2016-08-30 MED ORDER — ATORVASTATIN CALCIUM 80 MG PO TABS
80.0000 mg | ORAL_TABLET | Freq: Every day | ORAL | Status: DC
  Administered 2016-08-30 – 2016-08-31 (×2): 80 mg via ORAL
  Filled 2016-08-30 (×4): qty 1

## 2016-08-30 MED ORDER — METOPROLOL TARTRATE 5 MG/5ML IV SOLN
INTRAVENOUS | Status: AC
  Administered 2016-08-30: 5 mg via INTRAVENOUS
  Filled 2016-08-30: qty 5

## 2016-08-30 MED ORDER — ONDANSETRON HCL 4 MG/2ML IJ SOLN: 4.0000 mg | Freq: Four times a day (QID) | INTRAMUSCULAR | Status: DC | PRN

## 2016-08-30 MED ORDER — INSULIN ASPART 100 UNIT/ML ~~LOC~~ SOLN
0.0000 [IU] | Freq: Three times a day (TID) | SUBCUTANEOUS | Status: DC
  Administered 2016-08-30: 15 [IU] via SUBCUTANEOUS
  Administered 2016-08-31: 8 [IU] via SUBCUTANEOUS
  Administered 2016-08-31 – 2016-09-01 (×4): 5 [IU] via SUBCUTANEOUS
  Administered 2016-09-01: 3 [IU] via SUBCUTANEOUS
  Administered 2016-09-02: 8 [IU] via SUBCUTANEOUS
  Administered 2016-09-02 – 2016-09-03 (×3): 3 [IU] via SUBCUTANEOUS
  Administered 2016-09-03 – 2016-09-04 (×3): 2 [IU] via SUBCUTANEOUS
  Administered 2016-09-04: 3 [IU] via SUBCUTANEOUS
  Administered 2016-09-05: 5 [IU] via SUBCUTANEOUS
  Administered 2016-09-05: 2 [IU] via SUBCUTANEOUS
  Administered 2016-09-06: 3 [IU] via SUBCUTANEOUS
  Filled 2016-08-30 (×2): qty 8
  Filled 2016-08-30: qty 2
  Filled 2016-08-30: qty 5
  Filled 2016-08-30 (×2): qty 3
  Filled 2016-08-30: qty 5
  Filled 2016-08-30 (×2): qty 3
  Filled 2016-08-30 (×2): qty 2
  Filled 2016-08-30 (×2): qty 5
  Filled 2016-08-30: qty 3
  Filled 2016-08-30: qty 2
  Filled 2016-08-30: qty 5
  Filled 2016-08-30: qty 14
  Filled 2016-08-30: qty 3

## 2016-08-30 MED ORDER — HEPARIN SODIUM (PORCINE) 5000 UNIT/ML IJ SOLN
5000.0000 [IU] | Freq: Three times a day (TID) | INTRAMUSCULAR | Status: DC
  Administered 2016-08-30 – 2016-09-01 (×6): 5000 [IU] via SUBCUTANEOUS
  Filled 2016-08-30 (×7): qty 1

## 2016-08-30 MED ORDER — ACETAMINOPHEN 650 MG RE SUPP: 650.0000 mg | Freq: Four times a day (QID) | RECTAL | Status: DC | PRN

## 2016-08-30 MED ORDER — METHYLPREDNISOLONE SODIUM SUCC 125 MG IJ SOLR
125.0000 mg | INTRAMUSCULAR | Status: DC | PRN
  Administered 2016-08-30: 125 mg via INTRAVENOUS

## 2016-08-30 MED ORDER — SODIUM CHLORIDE 0.9 % IV SOLN
INTRAVENOUS | Status: DC
  Administered 2016-08-30: 12:00:00 via INTRAVENOUS

## 2016-08-30 MED ORDER — SODIUM CHLORIDE 0.9 % IV SOLN
INTRAVENOUS | Status: DC
  Administered 2016-08-30: 16:00:00 via INTRAVENOUS

## 2016-08-30 MED ORDER — DILTIAZEM HCL 60 MG PO TABS
60.0000 mg | ORAL_TABLET | Freq: Three times a day (TID) | ORAL | Status: DC
  Administered 2016-08-30 – 2016-09-01 (×7): 60 mg via ORAL
  Filled 2016-08-30 (×7): qty 1

## 2016-08-30 MED ORDER — METOPROLOL TARTRATE 5 MG/5ML IV SOLN
5.0000 mg | Freq: Once | INTRAVENOUS | Status: AC
  Administered 2016-08-30: 5 mg via INTRAVENOUS

## 2016-08-30 MED ORDER — BISACODYL 5 MG PO TBEC
5.0000 mg | DELAYED_RELEASE_TABLET | Freq: Every day | ORAL | Status: DC | PRN
  Administered 2016-09-04: 5 mg via ORAL
  Filled 2016-08-30: qty 1

## 2016-08-30 MED ORDER — FUROSEMIDE 20 MG PO TABS
20.0000 mg | ORAL_TABLET | Freq: Every day | ORAL | Status: DC
  Administered 2016-08-31 – 2016-09-06 (×7): 20 mg via ORAL
  Filled 2016-08-30 (×7): qty 1

## 2016-08-30 MED ORDER — FAMOTIDINE 20 MG PO TABS
40.0000 mg | ORAL_TABLET | ORAL | Status: DC | PRN
  Administered 2016-08-30: 40 mg via ORAL

## 2016-08-30 MED ORDER — CEPHALEXIN 500 MG PO CAPS: 500.0000 mg | ORAL_CAPSULE | Freq: Four times a day (QID) | ORAL | Status: DC

## 2016-08-30 MED ORDER — TRAZODONE HCL 50 MG PO TABS: 25.0000 mg | ORAL_TABLET | Freq: Every evening | ORAL | Status: DC | PRN

## 2016-08-30 MED ORDER — CEFAZOLIN IN D5W 1 GM/50ML IV SOLN: 1.0000 g | Freq: Once | INTRAVENOUS | Status: DC

## 2016-08-30 MED ORDER — OXYCODONE-ACETAMINOPHEN 5-325 MG PO TABS
1.0000 | ORAL_TABLET | ORAL | Status: DC | PRN
  Administered 2016-09-01 – 2016-09-06 (×8): 1 via ORAL
  Filled 2016-08-30 (×8): qty 1

## 2016-08-30 MED ORDER — ASPIRIN EC 81 MG PO TBEC
81.0000 mg | DELAYED_RELEASE_TABLET | Freq: Every day | ORAL | Status: DC
  Administered 2016-08-30 – 2016-09-06 (×8): 81 mg via ORAL
  Filled 2016-08-30 (×8): qty 1

## 2016-08-30 MED ORDER — ACETAMINOPHEN 325 MG PO TABS
650.0000 mg | ORAL_TABLET | Freq: Four times a day (QID) | ORAL | Status: DC | PRN
  Administered 2016-09-04: 650 mg via ORAL
  Filled 2016-08-30 (×2): qty 2

## 2016-08-30 MED ORDER — CANAGLIFLOZIN 100 MG PO TABS: 100.0000 mg | ORAL_TABLET | Freq: Every day | ORAL | Status: DC

## 2016-08-30 MED ORDER — ONDANSETRON HCL 4 MG/2ML IJ SOLN
4.0000 mg | Freq: Four times a day (QID) | INTRAMUSCULAR | Status: DC | PRN
  Administered 2016-09-04: 4 mg via INTRAVENOUS
  Filled 2016-08-30: qty 2

## 2016-08-30 MED ORDER — METHYLPREDNISOLONE SODIUM SUCC 125 MG IJ SOLR
INTRAMUSCULAR | Status: AC
  Administered 2016-08-30: 125 mg via INTRAVENOUS
  Filled 2016-08-30: qty 2

## 2016-08-30 MED ORDER — DOCUSATE SODIUM 100 MG PO CAPS
100.0000 mg | ORAL_CAPSULE | Freq: Two times a day (BID) | ORAL | Status: DC
  Administered 2016-08-30 – 2016-09-06 (×14): 100 mg via ORAL
  Filled 2016-08-30 (×14): qty 1

## 2016-08-30 MED ORDER — AMLODIPINE BESY-BENAZEPRIL HCL 5-20 MG PO CAPS: 1.0000 | ORAL_CAPSULE | Freq: Every day | ORAL | Status: DC

## 2016-08-30 MED ORDER — FAMOTIDINE 20 MG PO TABS
ORAL_TABLET | ORAL | Status: AC
  Administered 2016-08-30: 40 mg via ORAL
  Filled 2016-08-30: qty 2

## 2016-08-30 MED ORDER — METOPROLOL SUCCINATE ER 100 MG PO TB24
100.0000 mg | ORAL_TABLET | Freq: Every day | ORAL | Status: DC
  Administered 2016-08-31 – 2016-09-01 (×2): 100 mg via ORAL
  Filled 2016-08-30: qty 2
  Filled 2016-08-30: qty 1
  Filled 2016-08-30: qty 2

## 2016-08-30 NOTE — Consult Note (Signed)
[facility] Nurse wound consult note Reason for Consult: Vascular injury to left foot, plantar surface near lateral great toe, and between left third and fourth metatarsal.  Wound type:vascular/neuropathic injury Pressure Injury POA: Yes Measurement:Left plantar foot 2 cm x 1 cm eschar with 2 small fissures beside it.  All are dry intact eschar.  Left third and fourth metatarsal: 1 cm x 0.3 cm eschar with scant purulence noted.   Wound PTE:LMRAJH Drainage (amount, consistency, odor)dry eschar  Periwound:intact Dressing procedure/placement/frequency:Awaiting vascular procedure tomorrow.  Keep left foot wounds open to air.  To left toes:  Dry dressing to wound bed.  Cover with 2x2 gauze and tape to pad and protect.  Will not follow at this time.  Please re-consult if needed.  [name] RN BSN [place] Pager [phone]

## 2016-08-30 NOTE — Consult Note (Signed)
[facility] Vascular Consult Note  MRN : [id number]  [name] is a 82 y.o. (November 21, 1934) male who presents with chief complaint of No chief complaint on file. Marland Kitchen  History of Present Illness: The patient came in today for his scheduled left lower extremity angiogram, but was found to be in rapid atrial fibrillation with a heart rate in excess of 140 and irregular. He did not notice that he was tachycardic or having palpitations. He continues to complain of significant left foot pain and his wound has not improved despite an angiogram several weeks ago. For this reason, we were planning a left lower extremity angiogram today. He does not have fever or chills. He is followed by podiatry for his wound care of his left foot. He reports no recent trauma or injury. Because of his significant tachycardia with rapid atrial fibrillation, his procedure today was canceled and he is being admitted to the hospitalist service.  Current Facility-Administered Medications  Medication Dose Route Frequency Provider Last Rate Last Dose  . acetaminophen (TYLENOL) tablet 650 mg  650 mg Oral Q6H PRN Epifanio Lesches, MD       Or  . acetaminophen (TYLENOL) suppository 650 mg  650 mg Rectal Q6H PRN Epifanio Lesches, MD      . amLODipine-benazepril (LOTREL) 5-20 MG per capsule 1 capsule  1 capsule Oral Daily Epifanio Lesches, MD      . aspirin EC tablet 81 mg  81 mg Oral Daily Epifanio Lesches, MD      . atorvastatin (LIPITOR) tablet 80 mg  80 mg Oral q1800 Epifanio Lesches, MD      . bisacodyl (DULCOLAX) EC tablet 5 mg  5 mg Oral Daily PRN Epifanio Lesches, MD      . Derrill Memo ON 08/31/2016] canagliflozin (INVOKANA) tablet 100 mg  100 mg Oral QAC breakfast Epifanio Lesches, MD      . ceFAZolin (ANCEF) IVPB 1 g/50 mL premix  1 g Intravenous Once Algernon Huxley, MD      . diltiazem (CARDIZEM) tablet 60 mg  60 mg Oral Q8H Epifanio Lesches, MD      . docusate sodium (COLACE)  capsule 100 mg  100 mg Oral BID Epifanio Lesches, MD      . Dulaglutide SOPN 1.5 mg  1.5 mg Intramuscular Weekly Epifanio Lesches, MD      . famotidine (PEPCID) tablet 40 mg  40 mg Oral PRN Janalyn Harder Stegmayer, PA-C   40 mg at 08/30/16 1154  . furosemide (LASIX) tablet 20 mg  20 mg Oral Daily Epifanio Lesches, MD      . heparin injection 5,000 Units  5,000 Units Subcutaneous Q8H Epifanio Lesches, MD      . HYDROmorphone (DILAUDID) injection 1 mg  1 mg Intravenous Once PRN Janalyn Harder Stegmayer, PA-C      . irbesartan (AVAPRO) tablet 37.5 mg  37.5 mg Oral Daily Epifanio Lesches, MD      . methylPREDNISolone sodium succinate (SOLU-MEDROL) 125 mg/2 mL injection 125 mg  125 mg Intravenous PRN Janalyn Harder Stegmayer, PA-C   125 mg at 08/30/16 1155  . metoprolol succinate (TOPROL-XL) 24 hr tablet 100 mg  100 mg Oral Daily Epifanio Lesches, MD      . ondansetron (ZOFRAN) injection 4 mg  4 mg Intravenous Q6H PRN Kimberly A Stegmayer, PA-C      . ondansetron (ZOFRAN) tablet 4 mg  4 mg Oral Q6H PRN Epifanio Lesches, MD       Or  .  ondansetron (ZOFRAN) injection 4 mg  4 mg Intravenous Q6H PRN Epifanio Lesches, MD      . oxyCODONE-acetaminophen (PERCOCET/ROXICET) 5-325 MG per tablet 1 tablet  1 tablet Oral Q4H PRN Epifanio Lesches, MD      . traZODone (DESYREL) tablet 25 mg  25 mg Oral QHS PRN Epifanio Lesches, MD        Past Medical History:  Diagnosis Date  . Arthritis   . Atrial fibrillation ([place])   . Carcinoma of prostate ([place])   . CHF (congestive heart failure) ([place])   . Diabetes mellitus without complication ([place])   . ED (erectile dysfunction)   . Elevated blood pressure   . Frequent urination   . Hypertension   . Peripheral vascular disease ([place])   . Pneumonia 04/2016  . Prostate cancer ([place])   . Sleep apnea    OSA--USE C-PAP  . Urinary stress incontinence, male     Past Surgical History:  Procedure Laterality Date  . CHOLECYSTECTOMY    . EYE SURGERY  Bilateral    Cataract Extraction with IOL  . LOWER EXTREMITY ANGIOGRAPHY Left 08/16/2016   Procedure: Lower Extremity Angiography;  Surgeon: Algernon Huxley, MD;  Location: [place] [facility];  Service: Cardiovascular;  Laterality: Left;  . PROSTATE SURGERY     removal  . TONSILLECTOMY     as a child    Social History Social History  Substance Use Topics  . Smoking status: Former Smoker    Packs/day: 0.25    Types: Cigarettes    Quit date: 10/14/1994  . Smokeless tobacco: Never Used  . Alcohol use No    Family History Family History  Problem Relation Age of Onset  . Hypertension Mother   . Prostate cancer Neg Hx   . Bladder Cancer Neg Hx   . Kidney disease Neg Hx   No bleeding or clotting disorders  Allergies  Allergen Reactions  . Contrast Media [Iodinated Diagnostic Agents]     SOB  . Iohexol      Desc: Respiratory Distress, Laryngedema, diaphoresis, Onset Date: [id number]   . Latex Rash     REVIEW OF SYSTEMS (Negative unless checked)  Constitutional: [] Weight loss  [] Fever  [] Chills Cardiac: [] Chest pain   [] Chest pressure   [x] Palpitations   [] Shortness of breath when laying flat   [] Shortness of breath at rest   [x] Shortness of breath with exertion. Vascular:  [] Pain in legs with walking   [] Pain in legs at rest   [] Pain in legs when laying flat   [] Claudication   [] Pain in feet when walking  [x] Pain in feet at rest  [x] Pain in feet when laying flat   [] History of DVT   [] Phlebitis   [] Swelling in legs   [] Varicose veins   [x] Non-healing ulcers Pulmonary:   [] Uses home oxygen   [] Productive cough   [] Hemoptysis   [] Wheeze  [] COPD   [] Asthma Neurologic:  [] Dizziness  [] Blackouts   [] Seizures   [] History of stroke   [] History of TIA  [] Aphasia   [] Temporary blindness   [] Dysphagia   [] Weakness or numbness in arms   [] Weakness or numbness in legs Musculoskeletal:  [] Arthritis   [] Joint swelling   [] Joint pain   [] Low back pain Hematologic:  [] Easy bruising  [] Easy  bleeding   [] Hypercoagulable state   [] Anemic  [] Hepatitis Gastrointestinal:  [] Blood in stool   [] Vomiting blood  [] Gastroesophageal reflux/heartburn   [] Difficulty swallowing. Genitourinary:  [x] Chronic kidney disease   [] Difficult urination  [] Frequent  urination  [] Burning with urination   [] Blood in urine Skin:  [] Rashes   [x] Ulcers   [x] Wounds Psychological:  [] History of anxiety   []  History of major depression.  Physical Examination  Vitals:   08/30/16 1139  BP: 130/86  Pulse: (!) 140  Temp: 97.8 F (36.6 C)  TempSrc: Oral  Weight: 93.4 kg (206 lb)  Height: 5\' 7"  (1.702 m)   Body mass index is 32.26 kg/m. Gen:  WD/WN, NAD Head: [place]/AT, No temporalis wasting. Prominent temp pulse not noted. Ear/Nose/Throat: Hearing grossly intact, nares w/o erythema or drainage, oropharynx w/o Erythema/Exudate Eyes: Sclera non-icteric, conjunctiva clear Neck: Trachea midline.  No JVD.  Pulmonary:  Good air movement, respirations not labored, equal bilaterally.  Cardiac: Tachycardic and irregular Vascular:  Vessel Right Left  Radial Palpable Palpable                          PT 1+ Palpable Not Palpable  DP 1+ Palpable Not Palpable    Musculoskeletal: M/S 5/5 throughout. No deformity or atrophy. Left foot wounds are currently dressed today. Neurologic: Sensation grossly intact in extremities.  Symmetrical.  Speech is fluent. Motor exam as listed above. Psychiatric: Judgment intact, Mood & affect appropriate for pt's clinical situation. Dermatologic: No rashes or ulcers noted.  No cellulitis or open wounds. Lymph : No Cervical, Axillary, or Inguinal lymphadenopathy.      CBC Lab Results  Component Value Date   WBC 7.6 05/19/2016   HGB 14.7 05/19/2016   HCT 43.1 05/19/2016   MCV 86.1 05/19/2016   PLT 205 05/19/2016    BMET    Component Value Date/Time   NA 140 08/16/2016 1123   NA 140 01/20/2013 0255   K 4.1 08/16/2016 1123   K 3.5 01/20/2013 0255   CL 106  08/16/2016 1123   CL 103 01/20/2013 0255   CO2 23 08/16/2016 1123   CO2 30 01/20/2013 0255   GLUCOSE 323 (H) 08/16/2016 1123   GLUCOSE 145 (H) 01/20/2013 0255   BUN 23 (H) 08/27/2016 1200   BUN 22 (H) 01/20/2013 0255   CREATININE 1.22 08/27/2016 1200   CREATININE 1.09 01/20/2013 0255   CALCIUM 9.3 08/16/2016 1123   CALCIUM 9.4 01/20/2013 0255   GFRNONAA 55 (L) 08/27/2016 1200   GFRNONAA >60 01/20/2013 0255   GFRAA >60 08/27/2016 1200   GFRAA >60 01/20/2013 0255   Estimated Creatinine Clearance: 53.5 mL/min (by C-G formula based on SCr of 1.22 mg/dL).  COAG Lab Results  Component Value Date   INR 1.40 08/16/2016   INR 2.17 08/13/2016   INR 2.11 05/22/2016    Radiology No results found.    Assessment/Plan 1. PAD with ulceration left lower extremity. Originally scheduled for angiogram today, but that will need to be postponed. If his tachycardia can be controlled, I will plan to perform this on Wednesday. There was no availability on the schedule for tomorrow. He understands the risks and benefits of the procedure. 2. Tachycardia/atrial fibrillation with rapid ventricular response. Being admitted to medicine. Procedure on hold until this is controlled. 3. Diabetes. Control appears to be fair and blood glucose control important in reducing the progression of atherosclerotic disease. Also, involved in wound healing. On appropriate medications.    Leotis Pain, MD  08/30/2016 1:59 PM    This note was created with Dragon medical transcription system.  Any error is purely unintentional

## 2016-08-30 NOTE — H&P (Signed)
[place] [facility] History & Physical Update  The patient was interviewed and re-examined.  The patient's previous History and Physical has been reviewed and is unchanged.  There is no change in the plan of care. We plan to proceed with the scheduled procedure.  Jason Dew, MD  08/30/2016, 11:19 AM    

## 2016-08-30 NOTE — H&P (Signed)
[place] at [place] NAME: [name]    MR#:  [id number]  DATE OF BIRTH:  1934-08-15  DATE OF ADMISSION:  08/30/2016  PRIMARY CARE PHYSICIAN: No PCP Per Patient   REQUESTING/REFERRING PHYSICIAN: Dr.Dew  CHIEF COMPLAINT: A. fib with RVR   No chief complaint on file.   HISTORY OF PRESENT ILLNESS:  [name]  is a 82 y.o. male with a known history of Nonhealing ulcers for the left  Leg referred from podiatry office for reevaluation and scheduled for angiogram of the left leg today by vascular  but patient developed atrial fibrillation with RVR, how found to have heart rate up to 1 20 bpm, patient received a total of 10 mg of IV metoprolol but still has heart rate fluctuating between 108-1 38 bpm. The morning dose of Toprol-XL. Stop her Coumadin 3 days ago prepped information for angiogram today. Ms. cancel because of significant a guardian with rapid atrial fibrillation. Admitted the patient to telemetry. Vision denies chest pain, palpitations. Continue to have significant left foot pain, once in the left leg not improved status despite previous angiogram of the left leg on March 2016 with angioplasty of the left of the posterior tibial artery and left SFA with stents.  PAST MEDICAL HISTORY:   Past Medical History:  Diagnosis Date  . Arthritis   . Atrial fibrillation ([place])   . Carcinoma of prostate ([place])   . CHF (congestive heart failure) ([place])   . Diabetes mellitus without complication ([place])   . ED (erectile dysfunction)   . Elevated blood pressure   . Frequent urination   . Hypertension   . Peripheral vascular disease ([place])   . Pneumonia 04/2016  . Prostate cancer ([place])   . Sleep apnea    OSA--USE C-PAP  . Urinary stress incontinence, male     PAST SURGICAL HISTOIRY:   Past Surgical History:  Procedure Laterality Date  . CHOLECYSTECTOMY    . EYE SURGERY Bilateral    Cataract Extraction with IOL  . LOWER  EXTREMITY ANGIOGRAPHY Left 08/16/2016   Procedure: Lower Extremity Angiography;  Surgeon: Algernon Huxley, MD;  Location: [place] [facility];  Service: Cardiovascular;  Laterality: Left;  . PROSTATE SURGERY     removal  . TONSILLECTOMY     as a child    SOCIAL HISTORY:   Social History  Substance Use Topics  . Smoking status: Former Smoker    Packs/day: 0.25    Types: Cigarettes    Quit date: 10/14/1994  . Smokeless tobacco: Never Used  . Alcohol use No    FAMILY HISTORY:   Family History  Problem Relation Age of Onset  . Hypertension Mother   . Prostate cancer Neg Hx   . Bladder Cancer Neg Hx   . Kidney disease Neg Hx     DRUG ALLERGIES:   Allergies  Allergen Reactions  . Contrast Media [Iodinated Diagnostic Agents]     SOB  . Iohexol      Desc: Respiratory Distress, Laryngedema, diaphoresis, Onset Date: [id number]   . Latex Rash    REVIEW OF SYSTEMS:  CONSTITUTIONAL: No fever, fatigue or weakness.  EYES: No blurred or double vision.  EARS, NOSE, AND THROAT: No tinnitus or ear pain.  RESPIRATORY: No cough, shortness of breath, wheezing or hemoptysis.  CARDIOVASCULAR: No chest pain, orthopnea, edema.  GASTROINTESTINAL: No nausea, vomiting, diarrhea or abdominal pain.  GENITOURINARY: No dysuria, hematuria.  ENDOCRINE: No polyuria, nocturia,  HEMATOLOGY: No anemia, easy bruising or bleeding SKIN: No rash or lesion. MUSCULOSKELETAL; left leg pain.Marland Kitchen   NEUROLOGIC: No tingling, numbness, weakness.  PSYCHIATRY: No anxiety or depression.   MEDICATIONS AT HOME:   Prior to Admission medications   Medication Sig Start Date End Date Taking? Authorizing Provider  diphenhydrAMINE (BENADRYL) 25 mg capsule Take 25 mg by mouth. Sunday night before procedure, and the day of procedure before coming to hospital.   Yes Historical Provider, MD  furosemide (LASIX) 20 MG tablet TAKE 1 TABLET DAILY 10/24/15  Yes Historical Provider, MD  glucose blood (ONE TOUCH ULTRA TEST) test strip  Use 2 (two) times daily. Use as instructed. 07/11/14  Yes Historical Provider, MD  HUMALOG KWIKPEN 100 UNIT/ML KiwkPen Inject 6 Units into the skin 2 (two) times daily.  04/20/16  Yes Historical Provider, MD  JARDIANCE 10 MG TABS tablet Take 1 tablet by mouth daily. 06/21/16  Yes Historical Provider, MD  metFORMIN (GLUCOPHAGE) 1000 MG tablet TAKE 1 TABLET TWICE A DAY WITH MEALS 11/28/15  Yes Historical Provider, MD  metoprolol succinate (TOPROL-XL) 100 MG 24 hr tablet Take 100 mg by mouth daily. Take with or immediately following a meal.   Yes Historical Provider, MD  oxyCODONE-acetaminophen (PERCOCET/ROXICET) 5-325 MG tablet Take by mouth every 6 (six) hours as needed for severe pain.   Yes Historical Provider, MD  valsartan (DIOVAN) 320 MG tablet TAKE 1 TABLET DAILY 11/28/15  Yes Historical Provider, MD  amLODipine-benazepril (LOTREL) 5-20 MG capsule Take 1 capsule by mouth daily.    Historical Provider, MD  aspirin EC 81 MG tablet Take 1 tablet (81 mg total) by mouth daily. 08/16/16   Algernon Huxley, MD  atorvastatin (LIPITOR) 80 MG tablet Take 80 mg by mouth daily at 6 PM.     Historical Provider, MD  Blood Glucose Monitoring Suppl (FIFTY50 GLUCOSE METER 2.0) w/Device KIT Use as directed.    Historical Provider, MD  Blood Glucose Monitoring Suppl (ONE TOUCH ULTRA 2) W/DEVICE KIT Use as directed.    Historical Provider, MD  cephALEXin (KEFLEX) 500 MG capsule Take 500 mg by mouth 4 (four) times daily.    Historical Provider, MD  diltiazem (CARDIZEM CD) 300 MG 24 hr capsule Take 1 capsule (300 mg total) by mouth daily. 05/23/16   [name] Mandes, MD  Lancets 30G MISC Use 1 Device 2 (two) times daily.    Historical Provider, MD  TRULICITY 1.5 YH/0.6CB SOPN Inject 1.5 mg into the muscle once a week.  12/11/14   Historical Provider, MD  warfarin (COUMADIN) 5 MG tablet Take 5 mg by mouth daily.    Historical Provider, MD      VITAL SIGNS:  Blood pressure 130/86, pulse (!) 140, temperature 97.8 F (36.6 C),  temperature source Oral, height '5\' 7"'  (1.702 m), weight 93.4 kg (206 lb).  PHYSICAL EXAMINATION:  GENERAL:  82 y.o.-year-old patient lying in the bed with no acute distress.  EYES: Pupils equal, round, reactive to light and accommodation. No scleral icterus. Extraocular muscles intact.  HEENT: Head atraumatic, normocephalic. Oropharynx and nasopharynx clear.  NECK:  Supple, no jugular venous distention. No thyroid enlargement, no tenderness.  LUNGS: Normal breath sounds bilaterally, no wheezing, rales,rhonchi or crepitation. No use of accessory muscles of respiration.  CARDIOVASCULAR: S1, S2 normal. No murmurs, rubs, or gallops.  ABDOMEN: Soft, nontender, nondistended. Bowel sounds present. No organomegaly or mass.  EXTREMITIES: No pedal edema, patient has dressing for the left foot.   NEUROLOGIC: Cranial nerves II through  XII are intact. Muscle strength 5/5 in all extremities. Sensation intact. Gait not checked.  PSYCHIATRIC: The patient is alert and oriented x 3.  SKIN: No obvious rash, lesion, or ulcer.   LABORATORY PANEL:   CBC No results for input(s): WBC, HGB, HCT, PLT in the last 168 hours. ------------------------------------------------------------------------------------------------------------------  Chemistries   Recent Labs Lab 08/27/16 1200  BUN 23*  CREATININE 1.22   ------------------------------------------------------------------------------------------------------------------  Cardiac Enzymes No results for input(s): TROPONINI in the last 168 hours. ------------------------------------------------------------------------------------------------------------------  RADIOLOGY:  No results found.  EKG:   Orders placed or performed in visit on 08/30/16  . EKG 12-Lead   A. fib with RVR up to 1 20 bpm. IMPRESSION AND PLAN:   82 year old male patient with a nonhealing ulcers of the left foot scheduled to have angiogram of the left leg today but patient  developed A. fib with RVR.   1.A. fib with RVR: Admit to telemetry, continue Toprol-XL 100 MG daily, started on Cardizem  60  milligrams every 6 hours, consult cardiology, patient follows up with Dr. Nehemiah Massed .not taken  the Coumadin 3 days ago in preparation for angiogram today. Continue to hold and see if the patient heart rate is better he may have angiogram tomorrow.  get  basic labs his CBC, Chem-7.  #2 diabetes mellitus type 2: Continue home medicines except metformin because of preparation for contrast.   3.left leg  PAD;(previous angioplasty in the left leg on March 6 status post angioplasty of left tibial artery, distal left SFA, continued to have a nonhealing wound ulcers, podiatry following, scheduled to have a repeat angiogram of the left leg today but her seizure is canceled because patient developed atrial fibrillation with RVR.,;; Continue aspirin, statins  #4. Allergy to contrast: Patient received a steroids. #5 chronic pain in the left leg continue pain medicines, on antibiotics. HM them from tomorrow because patient also received IV antibiotic today. Consult vascular, cardiology. Discussed the plan with patient and patient's wife.      All the records are reviewed and case discussed with ED provider. Management plans discussed with the patient, family and they are in agreement.  CODE STATUS: full  TOTAL TIME TAKING CARE OF THIS PATIENT: 55 minutes.    Epifanio Lesches M.D on 08/30/2016 at 2:07 PM  Between 7am to 6pm - Pager - [phone]  After 6pm go to [email or web address] - password [email or web address] [facility]  [facility]  Office  [phone]  CC: Primary care physician; No PCP Per Patient  Note: This dictation was prepared with Dragon dictation along with smaller phrase technology. Any transcriptional errors that result from this process are unintentional.

## 2016-08-31 DIAGNOSIS — I70245 Atherosclerosis of native arteries of left leg with ulceration of other part of foot: Secondary | ICD-10-CM | POA: Diagnosis not present

## 2016-08-31 LAB — BASIC METABOLIC PANEL
ANION GAP: 11 (ref 5–15)
BUN: 35 mg/dL — AB (ref 6–20)
CALCIUM: 8.8 mg/dL — AB (ref 8.9–10.3)
CO2: 21 mmol/L — AB (ref 22–32)
Chloride: 103 mmol/L (ref 101–111)
Creatinine, Ser: 1.28 mg/dL — ABNORMAL HIGH (ref 0.61–1.24)
GFR calc Af Amer: 60 mL/min — ABNORMAL LOW (ref >60)
GFR calc non Af Amer: 52 mL/min — ABNORMAL LOW (ref >60)
GLUCOSE: 313 mg/dL — AB (ref 65–99)
Potassium: 3.9 mmol/L (ref 3.5–5.1)
Sodium: 135 mmol/L (ref 135–145)

## 2016-08-31 LAB — GLUCOSE, CAPILLARY
GLUCOSE-CAPILLARY: 282 mg/dL — AB (ref 65–99)
GLUCOSE-CAPILLARY: 231 mg/dL — AB (ref 65–99)
Glucose-Capillary: 214 mg/dL — ABNORMAL HIGH (ref 65–99)
Glucose-Capillary: 174 mg/dL — ABNORMAL HIGH (ref 65–99)

## 2016-08-31 LAB — CBC
HEMATOCRIT: 46.6 % (ref 40.0–52.0)
HEMOGLOBIN: 15.4 g/dL (ref 13.0–18.0)
MCH: 29.1 pg (ref 26.0–34.0)
MCHC: 33 g/dL (ref 32.0–36.0)
MCV: 88.1 fL (ref 80.0–100.0)
Platelets: 229 10*3/uL (ref 150–440)
RBC: 5.29 MIL/uL (ref 4.40–5.90)
RDW: 14.9 % — AB (ref 11.5–14.5)
WBC: 17.2 10*3/uL — ABNORMAL HIGH (ref 3.8–10.6)

## 2016-08-31 MED ORDER — SODIUM CHLORIDE 0.9 % IV SOLN
INTRAVENOUS | Status: DC
  Administered 2016-09-01 (×2): via INTRAVENOUS

## 2016-08-31 MED ORDER — CEFAZOLIN IN D5W 1 GM/50ML IV SOLN
1.0000 g | INTRAVENOUS | Status: DC
  Filled 2016-08-31: qty 50

## 2016-08-31 MED ORDER — INSULIN GLARGINE 100 UNIT/ML ~~LOC~~ SOLN
10.0000 [IU] | Freq: Every day | SUBCUTANEOUS | Status: DC
  Administered 2016-08-31 – 2016-09-03 (×4): 10 [IU] via SUBCUTANEOUS
  Filled 2016-08-31 (×5): qty 0.1

## 2016-08-31 NOTE — Progress Notes (Signed)
[place] at [place] NAME: [name]    MR#:  [id number]  DATE OF BIRTH:  Feb 13, 1935  SUBJECTIVE:  CHIEF COMPLAINT:  No chief complaint on file.  Right foot pain and ulcer REVIEW OF SYSTEMS:  Review of Systems  Constitutional: Negative for chills, fever and malaise/fatigue.  HENT: Negative for congestion.   Eyes: Negative for blurred vision and double vision.  Respiratory: Negative for cough, shortness of breath, wheezing and stridor.   Cardiovascular: Negative for chest pain, palpitations and leg swelling.  Gastrointestinal: Negative for abdominal pain, blood in stool, constipation, diarrhea, melena, nausea and vomiting.  Genitourinary: Negative for dysuria, hematuria and urgency.  Musculoskeletal: Negative for back pain.       Left foot pain.  Skin: Negative for itching and rash.  Neurological: Negative for dizziness, focal weakness, loss of consciousness, weakness and headaches.  Psychiatric/Behavioral: Negative for depression. The patient is not nervous/anxious.     DRUG ALLERGIES:   Allergies  Allergen Reactions  . Contrast Media [Iodinated Diagnostic Agents]     SOB  . Iohexol      Desc: Respiratory Distress, Laryngedema, diaphoresis, Onset Date: [id number]   . Latex Rash   VITALS:  Blood pressure 137/70, pulse 89, temperature 97.5 F (36.4 C), temperature source Oral, resp. rate 20, height 5\' 7"  (1.702 m), weight 201 lb 11.2 oz (91.5 kg), SpO2 93 %. PHYSICAL EXAMINATION:  Physical Exam  Constitutional: He is oriented to person, place, and time and well-developed, well-nourished, and in no distress.  HENT:  Head: Normocephalic.  Mouth/Throat: Oropharynx is clear and moist.  Eyes: Conjunctivae and EOM are normal. No scleral icterus.  Neck: Normal range of motion. Neck supple. No JVD present. No tracheal deviation present. No thyromegaly present.  Cardiovascular: Normal rate and normal heart sounds.  Exam reveals no  gallop.   No murmur heard. Irregular rhythm.  Pulmonary/Chest: Effort normal and breath sounds normal. No respiratory distress. He has no wheezes. He has no rales.  Abdominal: Soft. Bowel sounds are normal. He exhibits no distension. There is no tenderness. There is no rebound.  Musculoskeletal: Normal range of motion. He exhibits no edema or tenderness.  Left foot wounds in dressing. Weak pedal pulses. Cold foot.  Neurological: He is alert and oriented to person, place, and time. No cranial nerve deficit.  Skin: No rash noted. No erythema.  Psychiatric: Affect normal.   LABORATORY PANEL:  Male CBC  Recent Labs Lab 08/31/16 0358  WBC 17.2*  HGB 15.4  HCT 46.6  PLT 229   ------------------------------------------------------------------------------------------------------------------ Chemistries   Recent Labs Lab 08/31/16 0358  NA 135  K 3.9  CL 103  CO2 21*  GLUCOSE 313*  BUN 35*  CREATININE 1.28*  CALCIUM 8.8*   RADIOLOGY:  No results found. ASSESSMENT AND PLAN:   82 year old male patient with a nonhealing ulcers of the left foot scheduled to have angiogram of the left leg today but patient developed A. fib with RVR.   1.A. fib with RVR: Heart rate is controlled.  continue Toprol-XL 100 MG daily, started on Cardizem  60  milligrams every 6 hours, consult cardiology, patient follows up with Dr. Nehemiah Massed .not taken  the Coumadin 3 days ago in preparation for angiogram. Continue to hold. Resume warfarin for stroke prevention as soon as possible post procedure per cardiology consult.  #2 diabetes mellitus type 2: Continue home medicines except metformin because of preparation for contrast.  3.left leg  PAD;(previous angioplasty in  the left leg on March 6 status post angioplasty of left tibial artery, distal left SFA, continued to have a nonhealing wound ulcers, podiatry following, scheduled to have a repeat angiogram of the left leg today  continue aspirin,  statins  #4. Allergy to contrast: Patient received a steroids. #5 chronic pain in the left leg continue pain medicines, on antibiotics.  Follow-up Dr.Dew.  Leukocytosis. Possible due to steroids. Follow-up CBC in a.m.  All the records are reviewed and case discussed with Care Management/Social Worker. Management plans discussed with the patient, family and they are in agreement.  CODE STATUS: Full Code  TOTAL TIME TAKING CARE OF THIS PATIENT: 35 minutes.   More than 50% of the time was spent in counseling/coordination of care: YES  POSSIBLE D/C IN 1-2 DAYS, DEPENDING ON CLINICAL CONDITION.   [name] M.D on 08/31/2016 at 3:24 PM  Between 7am to 6pm - Pager - [phone]  After 6pm go to [email or web address] - Technical brewer [facility]  Office  [phone]  CC: Primary care physician; No PCP Per Patient  Note: This dictation was prepared with Dragon dictation along with smaller phrase technology. Any transcriptional errors that result from this process are unintentional.

## 2016-08-31 NOTE — Care Management CC44 (Signed)
Condition Code 44 Documentation Completed  Patient Details  Name: [name] MRN: [id number] Date of Birth: [birth year]   Condition Code 44 given:  Yes Patient signature on Condition Code 44 notice:  Yes Documentation of 2 MD's agreement:  Yes Code 44 added to claim:  Yes    Katrina Stack, RN 08/31/2016, 2:22 PM

## 2016-08-31 NOTE — Care Management (Signed)
Patient admitted 4.9. 2018 after presenting for angiogram and found to be in atrial fib with rvr.  Has previous history. Worsening wounds on lower extremity.  Required one dose IV metoprolol.  Cardiology consult is now in progress.  Patient on chronic coumadin which is currently on hold.  Anticipate angiogram 4.11.2018

## 2016-08-31 NOTE — Care Management Obs Status (Signed)
[place] NOTIFICATION   Patient Details  Name: [name] MRN: [id number] Date of Birth: 03/05/1935   Medicare Observation Status Notification Given:  Yes    Katrina Stack, RN 08/31/2016, 2:22 PM

## 2016-08-31 NOTE — Progress Notes (Signed)
Inpatient Diabetes Program Recommendations  AACE/ADA: New Consensus Statement on Inpatient Glycemic Control (2015)  Target Ranges:  Prepandial:   less than 140 mg/dL      Peak postprandial:   less than 180 mg/dL (1-2 hours)      Critically ill patients:  140 - 180 mg/dL   Lab Results  Component Value Date   GLUCAP 282 (H) 08/31/2016  A1C in Care Everywhere=8.4% on 04/13/16  Review of Glycemic Control: Results for JAQUANN, GUARISCO (MRN [id number]) as of 08/31/2016 10:02  Ref. Range 08/30/2016 12:16 08/30/2016 16:52 08/30/2016 21:32 08/31/2016 07:26  Glucose-Capillary Latest Ref Range: 65 - 99 mg/dL 252 (H) 491 (H) 251 (H) 282 (H)   Diabetes history: Type 2 diabetes Outpatient Diabetes medications: Humalog 6 units bid, Jardiance 10 mg daily, Trulicity 1.5 mg weekly Current orders for Inpatient glycemic control:  Lantus 10 units q HS, Novolog moderate tid with meals, Trulicity 1.5 weekly  Inpatient Diabetes Program Recommendations:    Note that patient received Solumedrol 125 mg x1 which likely has contributed to hyperglycemia.  Agree with current orders.  Consider adding A1C to labs for updated review of previous 2-3 months of glycemic control.  Thanks, Adah Perl, RN, BC-ADM Inpatient Diabetes Coordinator Pager [phone] (8a-5p)

## 2016-08-31 NOTE — Consult Note (Signed)
[facility] Cardiology  CARDIOLOGY CONSULT NOTE  Patient ID: [name] MRN: [id number] DOB/AGE: 08/21/1934 82 y.o.  Admit date: 08/30/2016 Referring Physician Vianne Bulls Primary Physician [place] Primary Cardiologist Nehemiah Massed Reason for Consultation Atrial fibrillation with RVR  HPI: 82 year old male referred for atrial fibrillation with RVR. Patient has a history of chronic atrial fibrillation anticoagulated with warfarin, chronic diastolic CHF with EF 93-81% per echo 04/2016, moderate mitral regurgitation, and chronic pulmonary hypertension. Patient presented to [facility] yesterday for scheduled LLE angiogram, but was noted to be in atrial fibrillation at a rate of 140 bpm. The procedure was postponed and he was admitted for further management. Prior to admission, patient reports medication compliance. His home metoprolol succinate was continued, as well as diltiazem 60 mg every 8 hours. His rate is better controlled now at 100 bpm. Patient denies palpitations, chest pain, or shortness of breath.   Review of systems complete and found to be negative unless listed above     Past Medical History:  Diagnosis Date  . Arthritis   . Atrial fibrillation ([place])   . Carcinoma of prostate ([place])   . CHF (congestive heart failure) ([place])   . Diabetes mellitus without complication ([place])   . ED (erectile dysfunction)   . Elevated blood pressure   . Frequent urination   . Hypertension   . Peripheral vascular disease ([place])   . Pneumonia 04/2016  . Prostate cancer ([place])   . Sleep apnea    OSA--USE C-PAP  . Urinary stress incontinence, male     Past Surgical History:  Procedure Laterality Date  . CHOLECYSTECTOMY    . EYE SURGERY Bilateral    Cataract Extraction with IOL  . LOWER EXTREMITY ANGIOGRAPHY Left 08/16/2016   Procedure: Lower Extremity Angiography;  Surgeon: Algernon Huxley, MD;  Location: [facility];  Service: Cardiovascular;  Laterality: Left;  .  PROSTATE SURGERY     removal  . TONSILLECTOMY     as a child    Prescriptions Prior to Admission  Medication Sig Dispense Refill Last Dose  . diphenhydrAMINE (BENADRYL) 25 mg capsule Take 25 mg by mouth. Sunday night before procedure, and the day of procedure before coming to hospital.   08/30/2016 at 1100  . furosemide (LASIX) 20 MG tablet TAKE 1 TABLET DAILY   08/30/2016 at Unknown time  . glucose blood (ONE TOUCH ULTRA TEST) test strip Use 2 (two) times daily. Use as instructed.   08/29/2016 at Unknown time  . HUMALOG KWIKPEN 100 UNIT/ML KiwkPen Inject 6 Units into the skin 2 (two) times daily.    08/29/2016 at 1900  . JARDIANCE 10 MG TABS tablet Take 1 tablet by mouth daily.   08/30/2016 at Unknown time  . metFORMIN (GLUCOPHAGE) 1000 MG tablet TAKE 1 TABLET TWICE A DAY WITH MEALS   08/29/2016 at Unknown time  . metoprolol succinate (TOPROL-XL) 100 MG 24 hr tablet Take 100 mg by mouth daily. Take with or immediately following a meal.   08/30/2016 at Unknown time  . oxyCODONE-acetaminophen (PERCOCET/ROXICET) 5-325 MG tablet Take by mouth every 6 (six) hours as needed for severe pain.   08/30/2016 at Unknown time  . valsartan (DIOVAN) 320 MG tablet TAKE 1 TABLET DAILY   08/30/2016 at Unknown time  . amLODipine-benazepril (LOTREL) 5-20 MG capsule Take 1 capsule by mouth daily.   Taking  . aspirin EC 81 MG tablet Take 1 tablet (81 mg total) by mouth daily. 150 tablet 2 Taking  . atorvastatin (  LIPITOR) 80 MG tablet Take 80 mg by mouth daily at 6 PM.    Taking  . Blood Glucose Monitoring Suppl (FIFTY50 GLUCOSE METER 2.0) w/Device KIT Use as directed.   Taking  . Blood Glucose Monitoring Suppl (ONE TOUCH ULTRA 2) W/DEVICE KIT Use as directed.   Taking  . cephALEXin (KEFLEX) 500 MG capsule Take 500 mg by mouth 4 (four) times daily.     Marland Kitchen diltiazem (CARDIZEM CD) 300 MG 24 hr capsule Take 1 capsule (300 mg total) by mouth daily. 30 capsule 1 Taking  . Lancets 30G MISC Use 1 Device 2 (two) times daily.   Taking  .  TRULICITY 1.5 ZC/5.8IF SOPN Inject 1.5 mg into the muscle once a week.    Taking  . warfarin (COUMADIN) 5 MG tablet Take 5 mg by mouth daily.   08/26/2016 at 1800   Social History   Social History  . Marital status: Married    Spouse name: N/A  . Number of children: N/A  . Years of education: N/A   Occupational History  . retired    Social History Main Topics  . Smoking status: Former Smoker    Packs/day: 0.25    Types: Cigarettes    Quit date: 10/14/1994  . Smokeless tobacco: Never Used  . Alcohol use No  . Drug use: No  . Sexual activity: Not on file   Other Topics Concern  . Not on file   Social History Narrative  . No narrative on file    Family History  Problem Relation Age of Onset  . Hypertension Mother   . Prostate cancer Neg Hx   . Bladder Cancer Neg Hx   . Kidney disease Neg Hx       Review of systems complete and found to be negative unless listed above      PHYSICAL EXAM  General: Well developed, well nourished, in no acute distress HEENT:  Normocephalic and atramatic Neck:  No JVD.  Lungs: Clear bilaterally to auscultation Heart: Irregularly irregular Abdomen: Bowel sounds are positive, abdomen soft and non-tender  Msk:  Back normal, patient lying in bed, able to sit up without difficulty Extremities: No clubbing, cyanosis, + pedal edema  Neuro: Alert and oriented X 3. Psych:  Good affect, responds appropriately  Labs:   Lab Results  Component Value Date   WBC 17.2 (H) 08/31/2016   HGB 15.4 08/31/2016   HCT 46.6 08/31/2016   MCV 88.1 08/31/2016   PLT 229 08/31/2016    Recent Labs Lab 08/31/16 0358  NA 135  K 3.9  CL 103  CO2 21*  BUN 35*  CREATININE 1.28*  CALCIUM 8.8*  GLUCOSE 313*   Lab Results  Component Value Date   CKTOTAL 62 01/20/2013   CKMB 1.2 01/19/2013   TROPONINI <0.03 05/19/2016   No results found for: CHOL No results found for: HDL No results found for: LDLCALC No results found for: TRIG No results found  for: CHOLHDL No results found for: LDLDIRECT    Radiology: No results found.  EKG: Atrial fibrillation, rate 100 bpm  ASSESSMENT AND PLAN:  1. Atrial fibrillation with RVR, rate improved on current doses of metoprolol and diltiazem, warfarin being held for angiogram, patient asymptomatic.   Recommendations: 1. Agree with current therapy 2. May proceed with planned angiogram 3. Resume warfarin for stroke prevention as soon as possible post procedure 4. Plan to follow-up with Dr. Nehemiah Massed as outpatient.     Signed: Clabe Seal, PA-C 08/31/2016,  2:30 PM

## 2016-09-01 ENCOUNTER — Encounter
Admission: AD | Disposition: A | Discharge status: Home / Self Care | Payer: Self-pay | Source: Ambulatory Visit | Attending: Internal Medicine

## 2016-09-01 ENCOUNTER — Encounter: Payer: Self-pay | Admitting: *Deleted

## 2016-09-01 DIAGNOSIS — I70245 Atherosclerosis of native arteries of left leg with ulceration of other part of foot: Secondary | ICD-10-CM | POA: Diagnosis not present

## 2016-09-01 DIAGNOSIS — I70248 Atherosclerosis of native arteries of left leg with ulceration of other part of lower left leg: Secondary | ICD-10-CM | POA: Diagnosis not present

## 2016-09-01 HISTORY — PX: LOWER EXTREMITY ANGIOGRAPHY: CATH118251

## 2016-09-01 HISTORY — PX: LOWER EXTREMITY INTERVENTION: CATH118252

## 2016-09-01 LAB — GLUCOSE, CAPILLARY
GLUCOSE-CAPILLARY: 218 mg/dL — AB (ref 65–99)
Glucose-Capillary: 240 mg/dL — ABNORMAL HIGH (ref 65–99)
Glucose-Capillary: 221 mg/dL — ABNORMAL HIGH (ref 65–99)

## 2016-09-01 LAB — CBC
HEMATOCRIT: 44.7 % (ref 40.0–52.0)
HEMOGLOBIN: 14.8 g/dL (ref 13.0–18.0)
MCH: 29 pg (ref 26.0–34.0)
MCHC: 33.2 g/dL (ref 32.0–36.0)
MCV: 87.4 fL (ref 80.0–100.0)
Platelets: 192 10*3/uL (ref 150–440)
RBC: 5.12 MIL/uL (ref 4.40–5.90)
RDW: 15.1 % — ABNORMAL HIGH (ref 11.5–14.5)
WBC: 14.6 10*3/uL — AB (ref 3.8–10.6)

## 2016-09-01 LAB — PROTIME-INR
INR: 1.21
PROTHROMBIN TIME: 15.4 s — AB (ref 11.4–15.2)

## 2016-09-01 LAB — BASIC METABOLIC PANEL
ANION GAP: 7 (ref 5–15)
BUN: 34 mg/dL — ABNORMAL HIGH (ref 6–20)
CHLORIDE: 106 mmol/L (ref 101–111)
CO2: 25 mmol/L (ref 22–32)
Calcium: 8.3 mg/dL — ABNORMAL LOW (ref 8.9–10.3)
Creatinine, Ser: 0.91 mg/dL (ref 0.61–1.24)
GFR calc non Af Amer: >60 mL/min (ref >60)
Glucose, Bld: 177 mg/dL — ABNORMAL HIGH (ref 65–99)
Potassium: 3.7 mmol/L (ref 3.5–5.1)
SODIUM: 138 mmol/L (ref 135–145)

## 2016-09-01 LAB — MAGNESIUM: MAGNESIUM: 2 mg/dL (ref 1.7–2.4)

## 2016-09-01 SURGERY — LOWER EXTREMITY ANGIOGRAPHY
Anesthesia: Moderate Sedation | Laterality: Left

## 2016-09-01 MED ORDER — MIDAZOLAM HCL 5 MG/5ML IJ SOLN
INTRAMUSCULAR | Status: AC
  Filled 2016-09-01: qty 5

## 2016-09-01 MED ORDER — ONDANSETRON HCL 4 MG/2ML IJ SOLN
INTRAMUSCULAR | Status: AC
  Filled 2016-09-01: qty 2

## 2016-09-01 MED ORDER — DIPHENHYDRAMINE HCL 50 MG/ML IJ SOLN
INTRAMUSCULAR | Status: AC
  Filled 2016-09-01: qty 1

## 2016-09-01 MED ORDER — ATORVASTATIN CALCIUM 20 MG PO TABS
80.0000 mg | ORAL_TABLET | Freq: Every day | ORAL | Status: DC
  Administered 2016-09-01 – 2016-09-05 (×5): 80 mg via ORAL
  Filled 2016-09-01 (×5): qty 4

## 2016-09-01 MED ORDER — FENTANYL CITRATE (PF) 100 MCG/2ML IJ SOLN
INTRAMUSCULAR | Status: DC | PRN
  Administered 2016-09-01: 50 ug via INTRAVENOUS

## 2016-09-01 MED ORDER — LIDOCAINE-EPINEPHRINE (PF) 2 %-1:200000 IJ SOLN
INTRAMUSCULAR | Status: AC
  Filled 2016-09-01: qty 20

## 2016-09-01 MED ORDER — HEPARIN SODIUM (PORCINE) 1000 UNIT/ML IJ SOLN
INTRAMUSCULAR | Status: AC
  Filled 2016-09-01: qty 1

## 2016-09-01 MED ORDER — ONDANSETRON HCL 4 MG/2ML IJ SOLN
4.0000 mg | Freq: Once | INTRAMUSCULAR | Status: AC
  Administered 2016-09-01: 4 mg via INTRAVENOUS

## 2016-09-01 MED ORDER — POLYETHYLENE GLYCOL 3350 17 G PO PACK
17.0000 g | PACK | Freq: Every day | ORAL | Status: DC | PRN
  Filled 2016-09-01: qty 1

## 2016-09-01 MED ORDER — CEFAZOLIN IN D5W 1 GM/50ML IV SOLN
INTRAVENOUS | Status: AC
  Filled 2016-09-01: qty 100

## 2016-09-01 MED ORDER — DILTIAZEM HCL ER COATED BEADS 180 MG PO CP24
300.0000 mg | ORAL_CAPSULE | Freq: Every day | ORAL | Status: DC
  Administered 2016-09-02 – 2016-09-04 (×3): 300 mg via ORAL
  Filled 2016-09-01 (×3): qty 1

## 2016-09-01 MED ORDER — FAMOTIDINE 20 MG PO TABS
ORAL_TABLET | ORAL | Status: AC
  Administered 2016-09-01: 20 mg via ORAL
  Filled 2016-09-01: qty 1

## 2016-09-01 MED ORDER — ALTEPLASE 2 MG IJ SOLR
INTRAMUSCULAR | Status: AC
  Filled 2016-09-01: qty 8

## 2016-09-01 MED ORDER — DIPHENHYDRAMINE HCL 50 MG/ML IJ SOLN
25.0000 mg | INTRAMUSCULAR | Status: AC
  Administered 2016-09-01: 25 mg via INTRAVENOUS
  Filled 2016-09-01: qty 0.5

## 2016-09-01 MED ORDER — ENOXAPARIN SODIUM 100 MG/ML ~~LOC~~ SOLN
1.0000 mg/kg | Freq: Two times a day (BID) | SUBCUTANEOUS | Status: DC
  Administered 2016-09-01 – 2016-09-05 (×8): 90 mg via SUBCUTANEOUS
  Filled 2016-09-01 (×8): qty 1

## 2016-09-01 MED ORDER — FAMOTIDINE 20 MG PO TABS
20.0000 mg | ORAL_TABLET | ORAL | Status: AC
  Administered 2016-09-01: 20 mg via ORAL

## 2016-09-01 MED ORDER — WARFARIN - PHYSICIAN DOSING INPATIENT: Freq: Every day | Status: DC

## 2016-09-01 MED ORDER — MIDAZOLAM HCL 2 MG/2ML IJ SOLN
INTRAMUSCULAR | Status: DC | PRN
  Administered 2016-09-01: 2 mg via INTRAVENOUS

## 2016-09-01 MED ORDER — HEPARIN SODIUM (PORCINE) 1000 UNIT/ML IJ SOLN
INTRAMUSCULAR | Status: DC | PRN
  Administered 2016-09-01: 5000 [IU] via INTRAVENOUS

## 2016-09-01 MED ORDER — METHYLPREDNISOLONE SODIUM SUCC 125 MG IJ SOLR
INTRAMUSCULAR | Status: AC
  Filled 2016-09-01: qty 2

## 2016-09-01 MED ORDER — DIPHENHYDRAMINE HCL 50 MG/ML IJ SOLN
INTRAMUSCULAR | Status: AC
  Administered 2016-09-01: 25 mg via INTRAVENOUS
  Filled 2016-09-01: qty 1

## 2016-09-01 MED ORDER — ALTEPLASE 2 MG IJ SOLR
INTRAMUSCULAR | Status: DC | PRN
  Administered 2016-09-01: 8 mg

## 2016-09-01 MED ORDER — METHYLPREDNISOLONE SODIUM SUCC 125 MG IJ SOLR
125.0000 mg | INTRAMUSCULAR | Status: AC
  Administered 2016-09-01: 125 mg via INTRAVENOUS

## 2016-09-01 MED ORDER — IOPAMIDOL (ISOVUE-300) INJECTION 61%
INTRAVENOUS | Status: DC | PRN
  Administered 2016-09-01: 40 mL via INTRA_ARTERIAL

## 2016-09-01 MED ORDER — CEFAZOLIN SODIUM-DEXTROSE 2-4 GM/100ML-% IV SOLN
2.0000 g | INTRAVENOUS | Status: DC
  Filled 2016-09-01: qty 100

## 2016-09-01 MED ORDER — WARFARIN SODIUM 5 MG PO TABS
5.0000 mg | ORAL_TABLET | Freq: Every day | ORAL | Status: DC
  Administered 2016-09-01 – 2016-09-05 (×5): 5 mg via ORAL
  Filled 2016-09-01 (×5): qty 1

## 2016-09-01 MED ORDER — DEXTROSE 5 % IV SOLN
2.0000 g | Freq: Once | INTRAVENOUS | Status: DC
  Filled 2016-09-01: qty 2000

## 2016-09-01 MED ORDER — FENTANYL CITRATE (PF) 100 MCG/2ML IJ SOLN
INTRAMUSCULAR | Status: AC
  Filled 2016-09-01: qty 2

## 2016-09-01 SURGICAL SUPPLY — 16 items
BALLN ARMADA 6.0X100X150 (BALLOONS) ×3
BALLOON ARMADA 6.0X100X150 (BALLOONS) ×2 IMPLANT
CANISTER PENUMBRA MAX (MISCELLANEOUS) ×3 IMPLANT
CATH INDIGO 6 ST-TIP 135CM (CATHETERS) ×3 IMPLANT
CATH INDIGO SEP 6 (CATHETERS) ×3 IMPLANT
CATH RIM 65CM (CATHETERS) ×3 IMPLANT
CATH VERT 100CM (CATHETERS) ×3 IMPLANT
DEVICE PRESTO INFLATION (MISCELLANEOUS) ×3 IMPLANT
DEVICE STARCLOSE SE CLOSURE (Vascular Products) ×3 IMPLANT
GLIDEWIRE ADV .035X260CM (WIRE) ×3 IMPLANT
PACK ANGIOGRAPHY (CUSTOM PROCEDURE TRAY) ×3 IMPLANT
SHEATH BRITE TIP 5FRX11 (SHEATH) ×3 IMPLANT
SHEATH PINNACLE ST 6F 45CM (SHEATH) ×3 IMPLANT
STENT VIABAHN 6X250X120 (Permanent Stent) ×3 IMPLANT
TUBING ASPIRATION INDIGO (MISCELLANEOUS) ×3 IMPLANT
WIRE G V18X300CM (WIRE) ×3 IMPLANT

## 2016-09-01 NOTE — Op Note (Addendum)
[facility] Percutaneous Study/Intervention Procedural Note   Date of Surgery: 09/01/2016  Surgeon(s):Aleesa Sweigert   Assistants:none  Pre-operative Diagnosis: PAD with ulceration left lower extremity  Post-operative diagnosis: Same  Procedure(s) Performed: 1. Ultrasound guidance for vascular access right femoral artery 2. Catheter placement into left popliteal artery from right femoral approach 3. Selective left lower extremity angiogram 4. Catheter directed lytic therapy with 8 mg of TPA delivered into the left SFA and popliteal arteries 5. Mechanical thrombectomy with the Penumbra CAT 6 device to the left SFA and popliteal arteries  6.  Covered stent placement to the left SFA and popliteal arteries with 6 mm diameter by 25 cm length Viabahn stent 7. StarClose closure device right femoral artery  EBL: Minimal  Contrast: 40 cc  Fluoro Time: 6.9 minutes  Moderate Conscious Sedation Time: approximately 35 minutes using 2 mg of Versed and 50 mcg of Fentanyl  Indications: Patient is a 82 y.o.male with continued ischemia of the left foot and leg with a nonhealing ulceration and no improvement after her revascularization procedure last month. The patient is brought in for angiography for further evaluation and potential treatment. Risks and benefits are discussed and informed consent is obtained  Procedure: The patient was identified and appropriate procedural time out was performed. The patient was then placed supine on the table and prepped and draped in the usual sterile fashion.Moderate conscious sedation was administered during a face to face encounter with the patient throughout the procedure with my supervision of the RN administering medicines and monitoring the patient's vital signs, pulse oximetry, telemetry and mental status throughout from the start of the  procedure until the patient was taken to the recovery room. Ultrasound was used to evaluate the right common femoral artery. It was patent . A digital ultrasound image was acquired. A Seldinger needle was used to access the right common femoral artery under direct ultrasound guidance and a permanent image was performed. A 0.035 J wire was advanced without resistance and a 5Fr sheath was placed. Aortogram was not necessary as his aortogram last month was basically normal. I then crossed the aortic bifurcation and advanced to the left femoral head. Selective left lower extremity angiogram was then performed. This demonstrated reasonably normal common femoral artery and profunda femoris artery but sluggish flow was seen in the superficial femoral artery although there was noted stenoses or occlusion proximally. More distally, thrombosis of previous stent was seen with deformation of the stent in the distal SFA. Two-vessel runoff distally although the anterior tibial artery appeared to occlude in the foot. The patient was systemically heparinized and a 6 [place] [employer] sheath was then placed over the [employer] wire. I then used a Kumpe catheter and the advantage wire to cross the occlusion without difficulty and put a Kumpe catheter and the popliteal artery. I then administered 8 mg of TPA in the left popliteal and SFA through this Kumpe catheter. After this was allowed to dwell for several minutes, mechanical thrombectomy was performed with the penumbra CAT 6 device. Proximally 200-300 cc of effluent were returned with several chunks of thrombus removed in the process. Angiogram following several passes with this device demonstrated residual thrombus in the SFA just above and in the proximal portion of the previously placed stent as well as at the bottom of the previously placed stent and into the above-knee popliteal artery. I then placed a 0.018 wire and parked this into the tibial vessels. A 6 mm diameter  by 25 cm  length Viabahn stent was then used from the left above-knee popliteal artery up to the mid SFA to cover the areas residual thrombus and also exclude the previous stent from circulation. This was postdilated with a 6 mm balloon with excellent angiographic completion result and no significant residual thrombus or stenosis identified and maintained flow distally. I elected to terminate the procedure. The sheath was removed and StarClose closure device was deployed in the right femoral artery with excellent hemostatic result. The patient was taken to the recovery room in stable condition having tolerated the procedure well.  Findings:   Left Lower Extremity: Thrombosis of previous stent with the formation of the stent in the distal SFA. Two-vessel runoff distally although the anterior tibial artery appeared to occlude in the foot.   Disposition: Patient was taken to the recovery room in stable condition having tolerated the procedure well.  Complications: None  Leotis Pain 09/01/2016 12:03 PM   This note was created with Dragon Medical transcription system. Any errors in dictation are purely unintentional.

## 2016-09-01 NOTE — H&P (Signed)
[facility] History & Physical Update  The patient was interviewed and re-examined.  The patient's previous History and Physical has been reviewed and is unchanged.  There is no change in the plan of care. We plan to proceed with the scheduled procedure.  Leotis Pain, MD  09/01/2016, 10:35 AM

## 2016-09-01 NOTE — Progress Notes (Signed)
[name]    MR#:  [id number]  DATE OF BIRTH:  [birth year]  SUBJECTIVE:  CHIEF COMPLAINT:  No chief complaint on file.  Right foot pain and ulcer REVIEW OF SYSTEMS:  Review of Systems  Constitutional: Negative for chills, fever and malaise/fatigue.  HENT: Negative for congestion.   Eyes: Negative for blurred vision and double vision.  Respiratory: Negative for cough, shortness of breath, wheezing and stridor.   Cardiovascular: Negative for chest pain, palpitations and leg swelling.  Gastrointestinal: Negative for abdominal pain, blood in stool, constipation, diarrhea, melena, nausea and vomiting.  Genitourinary: Negative for dysuria, hematuria and urgency.  Musculoskeletal: Negative for back pain.       Left foot pain.  Skin: Negative for itching and rash.  Neurological: Negative for dizziness, focal weakness, loss of consciousness, weakness and headaches.  Psychiatric/Behavioral: Negative for depression. The patient is not nervous/anxious.     DRUG ALLERGIES:   Allergies  Allergen Reactions  . Contrast Media [Iodinated Diagnostic Agents]     SOB  . Iohexol      Desc: Respiratory Distress, Laryngedema, diaphoresis, Onset Date: [id number]   . Latex Rash   VITALS:  Blood pressure (!) 125/99, pulse (!) 38, temperature 97.8 F (36.6 C), resp. rate 18, height 5\' 7"  (1.702 m), weight 92.3 kg (203 lb 8 oz), SpO2 (!) 88 %. PHYSICAL EXAMINATION:  Physical Exam  Constitutional: He is oriented to person, place, and time and well-developed, well-nourished, and in no distress.  HENT:  Head: Normocephalic.  Mouth/Throat: Oropharynx is clear and moist.  Eyes: Conjunctivae and EOM are normal. No scleral icterus.  Neck: Normal range of motion. Neck supple. No JVD present. No tracheal deviation present. No thyromegaly present.  Cardiovascular: Normal rate and normal heart sounds.  Exam reveals no gallop.   No  murmur heard. Irregular rhythm.  Pulmonary/Chest: Effort normal and breath sounds normal. No respiratory distress. He has no wheezes. He has no rales.  Abdominal: Soft. Bowel sounds are normal. He exhibits no distension. There is no tenderness. There is no rebound.  Musculoskeletal: Normal range of motion. He exhibits no edema or tenderness.  Left foot wounds in dressing. Weak pedal pulses. Cold foot.  Neurological: He is alert and oriented to person, place, and time. No cranial nerve deficit.  Skin: No rash noted. No erythema.  Psychiatric: Affect normal.   LABORATORY PANEL:  Male CBC  Recent Labs Lab 09/01/16 0402  WBC 14.6*  HGB 14.8  HCT 44.7  PLT 192   ------------------------------------------------------------------------------------------------------------------ Chemistries   Recent Labs Lab 09/01/16 0402  NA 138  K 3.7  CL 106  CO2 25  GLUCOSE 177*  BUN 34*  CREATININE 0.91  CALCIUM 8.3*  MG 2.0   RADIOLOGY:  No results found. ASSESSMENT AND PLAN:   82 year old male patient with a nonhealing ulcers of the left foot scheduled to have angiogram of the left leg today but patient developed A. fib with RVR.   # A. fib with RVR: Heart rate is controlled.  On toprol and cardizem Restart Coumadin  # diabetes mellitus type 2: Continue home medicines except metformin because of preparation for contrast.  # left leg  PAD;(previous angioplasty in the left leg on March 6 status post angioplasty of left tibial artery, distal left SFA, continued to have a nonhealing wound ulcers, podiatry following Angiogram showed thrombus in SFA and popliteal aretry stent. S/P lytic therapy stent Heaprin  #  Allergy to contrast: Patient received a steroids.  # chronic pain in the left leg continue pain medicines, on antibiotics.  Follow-up Dr.Dew.  All the records are reviewed and case discussed with Care Management/Social Worker. Management plans discussed with the  patient, family and they are in agreement.  CODE STATUS: Full Code  TOTAL TIME TAKING CARE OF THIS PATIENT: 35 minutes.   POSSIBLE D/C IN 1-2 DAYS, DEPENDING ON CLINICAL CONDITION.   [name] R M.D on 09/01/2016 at 3:26 PM  Between 7am to 6pm - Pager - [phone]  After 6pm go to [email or web address] - Technical brewer [facility]  Office  [phone]  CC: Primary care physician; No PCP Per Patient  Note: This dictation was prepared with Dragon dictation along with smaller phrase technology. Any transcriptional errors that result from this process are unintentional.

## 2016-09-01 NOTE — Plan of Care (Signed)
Problem: Pain Managment: Goal: General experience of comfort will improve Outcome: Progressing No complaints of pain on this shift, will continue to monitor.   Problem: Tissue Perfusion: Goal: Risk factors for ineffective tissue perfusion will decrease Outcome: Progressing Heparin subcutaneously for VTE while off of coumadin.  Problem: Fluid Volume: Goal: Ability to maintain a balanced intake and output will improve Outcome: Progressing Receiving IV normal saline at 28ml/hr.

## 2016-09-02 ENCOUNTER — Encounter: Payer: Self-pay | Admitting: Vascular Surgery

## 2016-09-02 DIAGNOSIS — I70248 Atherosclerosis of native arteries of left leg with ulceration of other part of lower left leg: Secondary | ICD-10-CM | POA: Diagnosis not present

## 2016-09-02 DIAGNOSIS — I70245 Atherosclerosis of native arteries of left leg with ulceration of other part of foot: Secondary | ICD-10-CM | POA: Diagnosis not present

## 2016-09-02 LAB — GLUCOSE, CAPILLARY
GLUCOSE-CAPILLARY: 277 mg/dL — AB (ref 65–99)
Glucose-Capillary: 154 mg/dL — ABNORMAL HIGH (ref 65–99)
Glucose-Capillary: 174 mg/dL — ABNORMAL HIGH (ref 65–99)
Glucose-Capillary: 117 mg/dL — ABNORMAL HIGH (ref 65–99)
Glucose-Capillary: 110 mg/dL — ABNORMAL HIGH (ref 65–99)

## 2016-09-02 MED ORDER — WARFARIN - PHARMACIST DOSING INPATIENT: Freq: Every day | Status: DC

## 2016-09-02 MED ORDER — METOPROLOL TARTRATE 5 MG/5ML IV SOLN
5.0000 mg | INTRAVENOUS | Status: DC | PRN
  Administered 2016-09-02 – 2016-09-04 (×2): 5 mg via INTRAVENOUS
  Filled 2016-09-02 (×2): qty 5

## 2016-09-02 MED ORDER — METOPROLOL TARTRATE 50 MG PO TABS
100.0000 mg | ORAL_TABLET | Freq: Two times a day (BID) | ORAL | Status: DC
  Administered 2016-09-02 – 2016-09-06 (×9): 100 mg via ORAL
  Filled 2016-09-02 (×9): qty 2

## 2016-09-02 NOTE — Progress Notes (Signed)
Inpatient Diabetes Program Recommendations  AACE/ADA: New Consensus Statement on Inpatient Glycemic Control (2015)  Target Ranges:  Prepandial:   less than 140 mg/dL      Peak postprandial:   less than 180 mg/dL (1-2 hours)      Critically ill patients:  140 - 180 mg/dL   Lab Results  Component Value Date   GLUCAP 277 (H) 09/02/2016    Review of Glycemic Control  Results for PHENG, PROKOP (MRN [id number]) as of 09/02/2016 11:45  Ref. Range 09/01/2016 13:44 09/01/2016 17:38 09/01/2016 20:29 09/02/2016 07:33 09/02/2016 11:36  Glucose-Capillary Latest Ref Range: 65 - 99 mg/dL 218 (H) 240 (H) 221 (H) 174 (H) 277 (H)    Diabetes history: Type 2 diabetes Outpatient Diabetes medications: Humalog 6 units bid, Jardiance 10 mg daily, Trulicity 1.5 mg weekly, Metformin 1000mg  bid  Current orders for Inpatient glycemic control: Lantus 10 units q HS, Novolog moderate correction scale (0-15 units) tid with meals  Inpatient Diabetes Program Recommendations:    Consider adding Novolog 0-5 units qhs.    Consider adding Novolog 3 units tid with meals.   Per ADA recommendations "consider performing an A1C on all patients with diabetes or hyperglycemia admitted to the hospital if not performed in the prior 3 months".  Gentry Fitz, RN, BA, MHA, CDE Diabetes Coordinator Inpatient Diabetes Program  [phone] (Team Pager) [phone] ([place]) 09/02/2016 11:49 AM

## 2016-09-02 NOTE — Progress Notes (Signed)
[facility]  Daily Progress Note   Subjective  - 1 Day Post-Op  Foot feeling much better.  Warmer and better color No events overnight.  Still in fib.  Restarted anticoagulation  Objective Vitals:   09/01/16 1927 09/02/16 0418 09/02/16 0748 09/02/16 1139  BP: 140/89 (!) 145/97 124/85 138/85  Pulse: 78 76 86 (!) 39  Resp: 18 18 16    Temp: (!) 96.7 F (35.9 C) 98 F (36.7 C) 97.9 F (36.6 C) 97.6 F (36.4 C)  TempSrc: Oral Oral Oral Oral  SpO2: 93% 97% 93% 96%  Weight:      Height:        Intake/Output Summary (Last 24 hours) at 09/02/16 1520 Last data filed at 09/02/16 1414  Gross per 24 hour  Intake              480 ml  Output              500 ml  Net              -20 ml    PULM  CTAB CV  RRR VASC  Foot warm, 1+ pedal pulse.  Laboratory CBC    Component Value Date/Time   WBC 14.6 (H) 09/01/2016 0402   HGB 14.8 09/01/2016 0402   HGB 16.3 01/20/2013 0255   HCT 44.7 09/01/2016 0402   HCT 47.9 01/20/2013 0255   PLT 192 09/01/2016 0402   PLT 245 01/20/2013 0255    BMET    Component Value Date/Time   NA 138 09/01/2016 0402   NA 140 01/20/2013 0255   K 3.7 09/01/2016 0402   K 3.5 01/20/2013 0255   CL 106 09/01/2016 0402   CL 103 01/20/2013 0255   CO2 25 09/01/2016 0402   CO2 30 01/20/2013 0255   GLUCOSE 177 (H) 09/01/2016 0402   GLUCOSE 145 (H) 01/20/2013 0255   BUN 34 (H) 09/01/2016 0402   BUN 22 (H) 01/20/2013 0255   CREATININE 0.91 09/01/2016 0402   CREATININE 1.09 01/20/2013 0255   CALCIUM 8.3 (L) 09/01/2016 0402   CALCIUM 9.4 01/20/2013 0255   GFRNONAA >60 09/01/2016 0402   GFRNONAA >60 01/20/2013 0255   GFRAA >60 09/01/2016 0402   GFRAA >60 01/20/2013 0255    Assessment/Planning: POD #1 s/p LLE revascularization   Doing well  OK to go with full anticoagulation from vascular POV  RTC 2-3 weeks with ABIs    Leotis Pain  09/02/2016, 3:20 PM

## 2016-09-02 NOTE — Care Management (Signed)
Discussed during progression patient has required changes in cardiac meds to control heart rate.  Was reported that patient to discharge on Eliquis- which is new but physician documents coumadin is being resumes.  CM informed that there are no physical therapy needs for patient.  Patient is independent in his room

## 2016-09-02 NOTE — Progress Notes (Signed)
[facility] at [place] NAME: [name]    MR#:  [id number]  DATE OF BIRTH:  [birth year]  SUBJECTIVE:  CHIEF COMPLAINT:  No chief complaint on file.  Left foot pain improved. Not as pale. Continues to have atrial fibrillation with rapid ventricular rate. Received IV metoprolol status post earlier due to significant tachycardia.   REVIEW OF SYSTEMS:  Review of Systems  Constitutional: Negative for chills, fever and malaise/fatigue.  HENT: Negative for congestion.   Eyes: Negative for blurred vision and double vision.  Respiratory: Negative for cough, shortness of breath, wheezing and stridor.   Cardiovascular: Negative for chest pain, palpitations and leg swelling.  Gastrointestinal: Negative for abdominal pain, blood in stool, constipation, diarrhea, melena, nausea and vomiting.  Genitourinary: Negative for dysuria, hematuria and urgency.  Musculoskeletal: Negative for back pain.       Left foot pain.  Skin: Negative for itching and rash.  Neurological: Negative for dizziness, focal weakness, loss of consciousness, weakness and headaches.  Psychiatric/Behavioral: Negative for depression. The patient is not nervous/anxious.     DRUG ALLERGIES:   Allergies  Allergen Reactions  . Contrast Media [Iodinated Diagnostic Agents]     SOB  . Iohexol      Desc: Respiratory Distress, Laryngedema, diaphoresis, Onset Date: [id number]   . Latex Rash   VITALS:  Blood pressure 138/85, pulse (!) 39, temperature 97.6 F (36.4 C), temperature source Oral, resp. rate 16, height 5\' 7"  (1.702 m), weight 92.3 kg (203 lb 8 oz), SpO2 96 %. PHYSICAL EXAMINATION:  Physical Exam  Constitutional: He is oriented to person, place, and time and well-developed, well-nourished, and in no distress.  HENT:  Head: Normocephalic.  Mouth/Throat: Oropharynx is clear and moist.  Eyes: Conjunctivae and EOM are normal. No scleral icterus.  Neck: Normal range of  motion. Neck supple. No JVD present. No tracheal deviation present. No thyromegaly present.  Cardiovascular: Normal rate and normal heart sounds.  Exam reveals no gallop.   No murmur heard. Irregular rhythm.  Pulmonary/Chest: Effort normal and breath sounds normal. No respiratory distress. He has no wheezes. He has no rales.  Abdominal: Soft. Bowel sounds are normal. He exhibits no distension. There is no tenderness. There is no rebound.  Musculoskeletal: Normal range of motion. He exhibits no edema or tenderness.  Dry gangrene this areas on left toes.  Neurological: He is alert and oriented to person, place, and time. No cranial nerve deficit.  Skin: No rash noted. No erythema.  Psychiatric: Affect normal.   LABORATORY PANEL:  Male CBC  Recent Labs Lab 09/01/16 0402  WBC 14.6*  HGB 14.8  HCT 44.7  PLT 192   ------------------------------------------------------------------------------------------------------------------ Chemistries   Recent Labs Lab 09/01/16 0402  NA 138  K 3.7  CL 106  CO2 25  GLUCOSE 177*  BUN 34*  CREATININE 0.91  CALCIUM 8.3*  MG 2.0   RADIOLOGY:  No results found. ASSESSMENT AND PLAN:   82 year old male patient with a nonhealing ulcers of the left foot scheduled to have angiogram of the left leg today but patient developed A. fib with RVR.   # A. fib with RVR On metoprolol and Cardizem. We will change to metoprolol twice a day and double dose. Consult cardiology due to difficult to control heart rate.  # diabetes mellitus type 2: Continue home medicines Sliding scale insulin  # left leg  PAD;(previous angioplasty in the left leg on March 6 status post angioplasty of left  tibial artery, distal left SFA, continued to have a nonhealing wound ulcers, podiatry following Angiogram showed thrombus in SFA and popliteal aretry stent. S/P lytic therapy stent Lovenox bridging. Start Coumadin.  # Allergy to contrast: Patient received a  steroids.  # chronic pain in the left leg continue pain medicines,   All the records are reviewed and case discussed with Care Management/Social Worker. Management plans discussed with the patient, family and they are in agreement.  CODE STATUS: Full Code  TOTAL TIME TAKING CARE OF THIS PATIENT: 35 minutes.   POSSIBLE D/C IN 1-2 DAYS, DEPENDING ON CLINICAL CONDITION.   Hillary Bow R M.D on 09/02/2016 at 2:21 PM  Between 7am to 6pm - Pager - [phone]  After 6pm go to [email or web address] - Technical brewer [facility]  Office  [phone]  CC: Primary care physician; No PCP Per Patient  Note: This dictation was prepared with Dragon dictation along with smaller phrase technology. Any transcriptional errors that result from this process are unintentional.

## 2016-09-02 NOTE — Progress Notes (Signed)
HR sustaining in the 120s-130s. Notified MD Pyreddy and order for metoprolol 5mg  IV Q4H PRN for HR > 110 received. Patient sleeping peacefully in bed and in NAD. Nursing staff will continue to monitor for any changes in patient status. Earleen Reaper, RN

## 2016-09-02 NOTE — Progress Notes (Signed)
ANTICOAGULATION CONSULT NOTE - Initial Consult  Pharmacy Consult for warfarin Indication: atrial fibrillation  Allergies  Allergen Reactions  . Contrast Media [Iodinated Diagnostic Agents]     SOB  . Iohexol      Desc: Respiratory Distress, Laryngedema, diaphoresis, Onset Date: [id number]   . Latex Rash    Patient Measurements: Height: 5\' 7"  (170.2 cm) Weight: 203 lb 8 oz (92.3 kg) IBW/kg (Calculated) : 66.1  Vital Signs: Temp: 97.6 F (36.4 C) (04/12 1139) Temp Source: Oral (04/12 1139) BP: 138/85 (04/12 1139) Pulse Rate: 39 (04/12 1139)  Labs:  Recent Labs  08/30/16 1539 08/31/16 0358 09/01/16 0402  HGB 15.6 15.4 14.8  HCT 47.4 46.6 44.7  PLT 224 229 192  LABPROT  --   --  15.4*  INR  --   --  1.21  CREATININE 1.43* 1.28* 0.91    Estimated Creatinine Clearance: 71.3 mL/min (by C-G formula based on SCr of 0.91 mg/dL).   Medical History: Past Medical History:  Diagnosis Date  . Arthritis   . Atrial fibrillation ([place])   . Carcinoma of prostate ([place])   . CHF (congestive heart failure) ([place])   . Diabetes mellitus without complication ([place])   . ED (erectile dysfunction)   . Frequent urination   . Hypertension   . Peripheral vascular disease ([place])   . Pneumonia 04/2016  . Prostate cancer ([place])   . Sleep apnea    OSA--USE C-PAP  . Urinary stress incontinence, male    Assessment: Pharmacy consulted to dose and monitor warfarin in this 82 year old male who was taking warfarin prior to admission for atrial fibrillation. Patient presented for scheduled angiogram so warfarin had been held for several days prior to admission and INR subtherapeutic.   Home warfarin dose: 5 mg PO daily  Dosing history: Date INR Dose 4/11 1.21 5 mg 4/12 -- 5 mg  Goal of Therapy:  INR 2-3 Monitor platelets by anticoagulation protocol: Yes   Plan:  Continue current home dose of warfarin 5 mg PO Daily with enoxaparin 1 mg/kg subQ BID bridge until INR is therapeutic. Will take  several days as warfarin has been held.   Lenis Noon, PharmD Clinical Pharmacist 09/02/2016,1:09 PM

## 2016-09-02 NOTE — Discharge Instructions (Addendum)
Resume diet and activity as before ° ° °

## 2016-09-03 ENCOUNTER — Encounter: Payer: Self-pay | Admitting: Vascular Surgery

## 2016-09-03 DIAGNOSIS — I70245 Atherosclerosis of native arteries of left leg with ulceration of other part of foot: Secondary | ICD-10-CM | POA: Diagnosis not present

## 2016-09-03 LAB — GLUCOSE, CAPILLARY
GLUCOSE-CAPILLARY: 176 mg/dL — AB (ref 65–99)
GLUCOSE-CAPILLARY: 191 mg/dL — AB (ref 65–99)
Glucose-Capillary: 142 mg/dL — ABNORMAL HIGH (ref 65–99)
Glucose-Capillary: 209 mg/dL — ABNORMAL HIGH (ref 65–99)

## 2016-09-03 LAB — PROTIME-INR
INR: 1.44
Prothrombin Time: 17.7 seconds — ABNORMAL HIGH (ref 11.4–15.2)

## 2016-09-03 MED ORDER — METFORMIN HCL 500 MG PO TABS
1000.0000 mg | ORAL_TABLET | Freq: Two times a day (BID) | ORAL | Status: DC
  Administered 2016-09-03 – 2016-09-06 (×6): 1000 mg via ORAL
  Filled 2016-09-03 (×6): qty 2

## 2016-09-03 MED ORDER — POLYETHYLENE GLYCOL 3350 17 G PO PACK: 17.0000 g | PACK | Freq: Every day | ORAL | Status: DC | PRN

## 2016-09-03 NOTE — Progress Notes (Signed)
ANTICOAGULATION CONSULT NOTE - Initial Consult  Pharmacy Consult for warfarin Indication: atrial fibrillation  Allergies  Allergen Reactions  . Contrast Media [Iodinated Diagnostic Agents]     SOB  . Iohexol      Desc: Respiratory Distress, Laryngedema, diaphoresis, Onset Date: [id number]   . Latex Rash    Patient Measurements: Height: 5\' 7"  (170.2 cm) Weight: 203 lb 11.2 oz (92.4 kg) IBW/kg (Calculated) : 66.1  Vital Signs: Temp: 98.6 F (37 C) (04/13 0741) Temp Source: Oral (04/13 0741) BP: 129/74 (04/13 0741) Pulse Rate: 103 (04/13 0741)  Labs:  Recent Labs  09/01/16 0402 09/03/16 0713  HGB 14.8  --   HCT 44.7  --   PLT 192  --   LABPROT 15.4* 17.7*  INR 1.21 1.44  CREATININE 0.91  --     Estimated Creatinine Clearance: 71.3 mL/min (by C-G formula based on SCr of 0.91 mg/dL).   Medical History: Past Medical History:  Diagnosis Date  . Arthritis   . Atrial fibrillation ([place])   . Carcinoma of prostate ([place])   . CHF (congestive heart failure) ([place])   . Diabetes mellitus without complication ([place])   . ED (erectile dysfunction)   . Frequent urination   . Hypertension   . Peripheral vascular disease ([place])   . Pneumonia 04/2016  . Prostate cancer ([place])   . Sleep apnea    OSA--USE C-PAP  . Urinary stress incontinence, male    Assessment: Pharmacy consulted to dose and monitor warfarin in this 82 year old male who was taking warfarin prior to admission for atrial fibrillation. Patient presented for scheduled angiogram so warfarin had been held for several days prior to admission and INR subtherapeutic.   Home warfarin dose: 5 mg PO daily  Dosing history: Date INR Dose 4/11 1.21 5 mg 4/12 -- 5 mg 4/13 1.44 5 mg  Goal of Therapy:  INR 2-3 Monitor platelets by anticoagulation protocol: Yes   Plan:  INR = 1.44 remains subtherapeutic but is increasing.   Continue current home dose of warfarin 5 mg PO daily with enoxaparin 1 mg/kg subQ BID bridge  until INR is therapeutic.   Will take ~5 days to see effects of resuming warfarin on INR since warfarin was held several days for surgical procedure.  Lenis Noon, PharmD Clinical Pharmacist 09/03/2016,9:14 AM

## 2016-09-03 NOTE — Progress Notes (Signed)
CBG = 142. Results viewed on glucometer. Glucometer is not transferring data, pulled from floor for service

## 2016-09-03 NOTE — Progress Notes (Signed)
[facility] at [place] NAME: [name]    MR#:  [id number]  DATE OF BIRTH:  12/29/1934  SUBJECTIVE:  CHIEF COMPLAINT:  No chief complaint on file.  Left foot pain improved. Not as pale.  Still tachycardic with atrial fibrillation   REVIEW OF SYSTEMS:  Review of Systems  Constitutional: Negative for chills, fever and malaise/fatigue.  HENT: Negative for congestion.   Eyes: Negative for blurred vision and double vision.  Respiratory: Negative for cough, shortness of breath, wheezing and stridor.   Cardiovascular: Negative for chest pain, palpitations and leg swelling.  Gastrointestinal: Negative for abdominal pain, blood in stool, constipation, diarrhea, melena, nausea and vomiting.  Genitourinary: Negative for dysuria, hematuria and urgency.  Musculoskeletal: Positive for joint pain. Negative for back pain.       Left foot pain.  Skin: Negative for itching and rash.  Neurological: Negative for dizziness, focal weakness, loss of consciousness, weakness and headaches.  Psychiatric/Behavioral: Negative for depression. The patient is not nervous/anxious.    DRUG ALLERGIES:   Allergies  Allergen Reactions  . Contrast Media [Iodinated Diagnostic Agents]     SOB  . Iohexol      Desc: Respiratory Distress, Laryngedema, diaphoresis, Onset Date: [id number]   . Latex Rash   VITALS:  Blood pressure 126/89, pulse (!) 126, temperature 97.8 F (36.6 C), temperature source Oral, resp. rate 18, height 5\' 7"  (1.702 m), weight 92.4 kg (203 lb 11.2 oz), SpO2 96 %. PHYSICAL EXAMINATION:  Physical Exam  Constitutional: He is oriented to person, place, and time and well-developed, well-nourished, and in no distress.  HENT:  Head: Normocephalic.  Mouth/Throat: Oropharynx is clear and moist.  Eyes: Conjunctivae and EOM are normal. No scleral icterus.  Neck: Normal range of motion. Neck supple. No JVD present. No tracheal deviation present. No  thyromegaly present.  Cardiovascular: Normal rate and normal heart sounds.  Exam reveals no gallop.   No murmur heard. Irregular rhythm.  Pulmonary/Chest: Effort normal and breath sounds normal. No respiratory distress. He has no wheezes. He has no rales.  Abdominal: Soft. Bowel sounds are normal. He exhibits no distension. There is no tenderness. There is no rebound.  Musculoskeletal: Normal range of motion. He exhibits no edema or tenderness.  Dry gangrene this areas on left toes.  Neurological: He is alert and oriented to person, place, and time. No cranial nerve deficit.  Skin: No rash noted. No erythema.  Psychiatric: Affect normal.   LABORATORY PANEL:  Male CBC  Recent Labs Lab 09/01/16 0402  WBC 14.6*  HGB 14.8  HCT 44.7  PLT 192   ------------------------------------------------------------------------------------------------------------------ Chemistries   Recent Labs Lab 09/01/16 0402  NA 138  K 3.7  CL 106  CO2 25  GLUCOSE 177*  BUN 34*  CREATININE 0.91  CALCIUM 8.3*  MG 2.0   RADIOLOGY:  No results found. ASSESSMENT AND PLAN:   82 year old male patient with a nonhealing ulcers of the left foot scheduled to have angiogram of the left leg today but patient developed A. fib with RVR.  # A. fib with RVR On metoprolol and Cardizem.  Metoprolol dose increased yesterday. He continues to have rapid ventricular rate. I discussed with Dr. Nehemiah Massed of cardiology. Either he or Dr. Clayborn Bigness will see the patient today. On Coumadin. Lovenox bridging.  # Diabetes mellitus type 2: Continue home medicines Sliding scale insulin  # left leg  PAD;(previous angioplasty in the left leg on March 6 status post angioplasty  of left tibial artery, distal left SFA, continued to have a nonhealing wound ulcers, podiatry following Angiogram showed thrombus in SFA and popliteal aretry stent. S/P lytic therapy stent Lovenox bridging. Started Coumadin.  # Allergy to contrast:  Patient received a steroids.  # chronic pain in the left leg continue pain medicines,   All the records are reviewed and case discussed with Care Management/Social Worker. Management plans discussed with the patient, family and they are in agreement.  CODE STATUS: Full Code  TOTAL TIME TAKING CARE OF THIS PATIENT: 35 minutes.   POSSIBLE D/C IN 1-2 DAYS, DEPENDING ON control of A. fib  Hillary Bow R M.D on 09/03/2016 at 12:36 PM  Between 7am to 6pm - Pager - [phone]  After 6pm go to [email or web address] - Technical brewer [facility]  Office  [phone]  CC: Primary care physician; No PCP Per Patient  Note: This dictation was prepared with Dragon dictation along with smaller phrase technology. Any transcriptional errors that result from this process are unintentional.

## 2016-09-03 NOTE — Consult Note (Signed)
Reason for Consult : atrial fibrillation rapid ventricular response Referring Physician: Dr.Sudini  hospitalist Dr. Leotis Pain vascular surgeon Cardiologist Dr. Teresa Coombs is an 82 y.o. male.  HPI: Patient presents with non-healing wound left leg diabetic peripheral vascular disease underwent intervention by Dr. Lucky Cowboy. Patient now on Coumadin for anticoagulation being bridged with Lovenox. Patient was found to have atrial fibrillation which is chronic rapid ventricular response he's had difficult to rate control is been on Cardizem 300 metoprolol 100 which is recently been increased to twice a day. Denies  tachycardia palpitations. Denies any chest pain shortness of breath. He states to be done recently well ready to go home if rates improved. Denies any blackout spells or syncope  Past Medical History:  Diagnosis Date  . Arthritis   . Atrial fibrillation ([place])   . Carcinoma of prostate ([place])   . CHF (congestive heart failure) ([place])   . Diabetes mellitus without complication ([place])   . ED (erectile dysfunction)   . Frequent urination   . Hypertension   . Peripheral vascular disease ([place])   . Pneumonia 04/2016  . Prostate cancer ([place])   . Sleep apnea    OSA--USE C-PAP  . Urinary stress incontinence, male     Past Surgical History:  Procedure Laterality Date  . CHOLECYSTECTOMY    . EYE SURGERY Bilateral    Cataract Extraction with IOL  . LOWER EXTREMITY ANGIOGRAPHY Left 08/16/2016   Procedure: Lower Extremity Angiography;  Surgeon: Algernon Huxley, MD;  Location: [facility];  Service: Cardiovascular;  Laterality: Left;  . LOWER EXTREMITY ANGIOGRAPHY Left 09/01/2016   Procedure: Lower Extremity Angiography;  Surgeon: Algernon Huxley, MD;  Location: [facility];  Service: Cardiovascular;  Laterality: Left;  . LOWER EXTREMITY INTERVENTION  09/01/2016   Procedure: Lower Extremity Intervention;  Surgeon: Algernon Huxley, MD;  Location: [facility];  Service:  Cardiovascular;;  . PROSTATE SURGERY     removal  . TONSILLECTOMY     as a child    Family History  Problem Relation Age of Onset  . Hypertension Mother   . Prostate cancer Neg Hx   . Bladder Cancer Neg Hx   . Kidney disease Neg Hx     Social History:  reports that he quit smoking about 21 years ago. His smoking use included Cigarettes. He smoked 0.25 packs per day. He has never used smokeless tobacco. He reports that he does not drink alcohol or use drugs.  Allergies:  Allergies  Allergen Reactions  . Contrast Media [Iodinated Diagnostic Agents]     SOB  . Iohexol      Desc: Respiratory Distress, Laryngedema, diaphoresis, Onset Date: [id number]   . Latex Rash    Medications: I have reviewed the patient's current medications.  Results for orders placed or performed during the hospital encounter of 08/30/16 (from the past 48 hour(s))  Glucose, capillary     Status: Abnormal   Collection Time: 09/02/16  7:33 AM  Result Value Ref Range   Glucose-Capillary 174 (H) 65 - 99 mg/dL   Comment 1 Notify RN   Glucose, capillary     Status: Abnormal   Collection Time: 09/02/16 11:36 AM  Result Value Ref Range   Glucose-Capillary 277 (H) 65 - 99 mg/dL   Comment 1 Notify RN   Glucose, capillary     Status: Abnormal   Collection Time: 09/02/16  4:52 PM  Result Value Ref Range   Glucose-Capillary 117 (  H) 65 - 99 mg/dL   Comment 1 Notify RN   Glucose, capillary     Status: Abnormal   Collection Time: 09/02/16  9:29 PM  Result Value Ref Range   Glucose-Capillary 110 (H) 65 - 99 mg/dL  Protime-INR     Status: Abnormal   Collection Time: 09/03/16  7:13 AM  Result Value Ref Range   Prothrombin Time 17.7 (H) 11.4 - 15.2 seconds   INR 1.44   Glucose, capillary     Status: Abnormal   Collection Time: 09/03/16  7:42 AM  Result Value Ref Range   Glucose-Capillary 142 (H) 65 - 99 mg/dL   Comment 1 Notify RN   Glucose, capillary     Status: Abnormal   Collection Time: 09/03/16 11:46 AM   Result Value Ref Range   Glucose-Capillary 176 (H) 65 - 99 mg/dL   Comment 1 Notify RN   Glucose, capillary     Status: Abnormal   Collection Time: 09/03/16  4:39 PM  Result Value Ref Range   Glucose-Capillary 191 (H) 65 - 99 mg/dL   Comment 1 Notify RN   Glucose, capillary     Status: Abnormal   Collection Time: 09/03/16  9:23 PM  Result Value Ref Range   Glucose-Capillary 209 (H) 65 - 99 mg/dL   Comment 1 Notify RN    Comment 2 Document in Chart     No results found.  Review of Systems  HENT: Negative.   Eyes: Negative.   Respiratory: Negative.   Cardiovascular: Positive for claudication.  Gastrointestinal: Negative.   Genitourinary: Negative.   Musculoskeletal: Positive for joint pain.  Skin: Negative.   Neurological: Positive for weakness.  Endo/Heme/Allergies: Negative.   Psychiatric/Behavioral: Negative.    Blood pressure (!) 153/83, pulse 77, temperature 98.2 F (36.8 C), temperature source Oral, resp. rate 18, height 5\' 7"  (1.702 m), weight 92.4 kg (203 lb 11.2 oz), SpO2 93 %. Physical Exam  Nursing note and vitals reviewed. Constitutional: He appears well-developed and well-nourished.  HENT:  Head: Normocephalic and atraumatic.  Eyes: Conjunctivae and EOM are normal. Pupils are equal, round, and reactive to light.  Neck: Normal range of motion. Neck supple.  Cardiovascular: S1 normal and S2 normal.  An irregularly irregular rhythm present. Tachycardia present.  PMI is displaced.  Exam reveals distant heart sounds.   Murmur heard.  Systolic murmur is present with a grade of 2/6    Assessment/Plan: Atrial fibrillation rapid ventricular response Peripheral vascular disease Diabetes Chronic pain Status post angioplasty left vertebral artery distal SFA Non-healing wound Hypertension Congestive heart failure Obstructive sleep apnea . Plan Agree with anticoagulation post peripheral vascular intervention Continue wound care for nonhealing wound Agree with  increasing metoprolol 200 twice a day for rate control Continue Cardizem 300 mg a day Consider adding digoxin for rate control as necessary Agree with Lipitor for hyperlipidemia Continue blood pressure control with Lotrel metoprolol Cardizem Agree with Percocet for chronic pain Hopefully we'll be able to discharge home soon once rates better control of the 100 `   [name] 09/03/2016, 11:52 PM

## 2016-09-04 DIAGNOSIS — I70245 Atherosclerosis of native arteries of left leg with ulceration of other part of foot: Secondary | ICD-10-CM | POA: Diagnosis not present

## 2016-09-04 DIAGNOSIS — I70248 Atherosclerosis of native arteries of left leg with ulceration of other part of lower left leg: Secondary | ICD-10-CM | POA: Diagnosis not present

## 2016-09-04 LAB — CREATININE, SERUM: Creatinine, Ser: 0.72 mg/dL (ref 0.61–1.24)

## 2016-09-04 LAB — CBC
HEMATOCRIT: 44.1 % (ref 40.0–52.0)
HEMOGLOBIN: 15 g/dL (ref 13.0–18.0)
MCH: 29.6 pg (ref 26.0–34.0)
MCHC: 34.1 g/dL (ref 32.0–36.0)
MCV: 87 fL (ref 80.0–100.0)
Platelets: 175 10*3/uL (ref 150–440)
RBC: 5.07 MIL/uL (ref 4.40–5.90)
RDW: 14.6 % — AB (ref 11.5–14.5)
WBC: 11.8 10*3/uL — AB (ref 3.8–10.6)

## 2016-09-04 LAB — GLUCOSE, CAPILLARY
GLUCOSE-CAPILLARY: 158 mg/dL — AB (ref 65–99)
GLUCOSE-CAPILLARY: 125 mg/dL — AB (ref 65–99)
GLUCOSE-CAPILLARY: 137 mg/dL — AB (ref 65–99)
Glucose-Capillary: 159 mg/dL — ABNORMAL HIGH (ref 65–99)

## 2016-09-04 LAB — PROTIME-INR
INR: 1.72
Prothrombin Time: 20.4 seconds — ABNORMAL HIGH (ref 11.4–15.2)

## 2016-09-04 MED ORDER — INSULIN GLARGINE 100 UNIT/ML ~~LOC~~ SOLN
20.0000 [IU] | Freq: Every day | SUBCUTANEOUS | Status: DC
  Administered 2016-09-04: 20 [IU] via SUBCUTANEOUS
  Filled 2016-09-04 (×2): qty 0.2

## 2016-09-04 MED ORDER — DILTIAZEM HCL ER COATED BEADS 240 MG PO CP24
240.0000 mg | ORAL_CAPSULE | Freq: Two times a day (BID) | ORAL | Status: DC
  Administered 2016-09-04 – 2016-09-06 (×4): 240 mg via ORAL
  Filled 2016-09-04 (×4): qty 1

## 2016-09-04 MED ORDER — SODIUM CHLORIDE 0.9% FLUSH
3.0000 mL | Freq: Two times a day (BID) | INTRAVENOUS | Status: DC
  Administered 2016-09-04 – 2016-09-06 (×4): 3 mL via INTRAVENOUS

## 2016-09-04 MED ORDER — DIGOXIN 0.25 MG/ML IJ SOLN
0.5000 mg | Freq: Once | INTRAMUSCULAR | Status: AC
  Administered 2016-09-04: 0.5 mg via INTRAVENOUS
  Filled 2016-09-04: qty 2

## 2016-09-04 NOTE — Progress Notes (Signed)
Slept off and on.  Up to Bathroom to void.  Remains in A fib.  Heart rate fluctuating.  IV metoprolol given x'1.   No c/o SOB.  Sats 92-93% on room air.  Left room remains warn to the touch. +1 pedal pulse.  Ulcers to left toes cleaned with saline, non-adhesive telfa placed between toes and wrapped with kerlex.  Right groin site unremarkable.

## 2016-09-04 NOTE — Progress Notes (Addendum)
[place] [facility] [place] NAME: [name]    MR#:  [id number]  DATE OF BIRTH:  21-Oct-1934  SUBJECTIVE:  CHIEF COMPLAINT:  No chief complaint on file.  Left foot pain improved. Not as pale.  Still tachycardic with atrial fibrillation, RVR, rate of 130 intermittently.   REVIEW OF SYSTEMS:  Review of Systems  Constitutional: Negative for chills, fever and malaise/fatigue.  HENT: Negative for congestion.   Eyes: Negative for blurred vision and double vision.  Respiratory: Negative for cough, shortness of breath, wheezing and stridor.   Cardiovascular: Negative for chest pain, palpitations and leg swelling.  Gastrointestinal: Negative for abdominal pain, blood in stool, constipation, diarrhea, melena, nausea and vomiting.  Genitourinary: Negative for dysuria, hematuria and urgency.  Musculoskeletal: Positive for joint pain. Negative for back pain.       Left foot pain.  Skin: Negative for itching and rash.  Neurological: Negative for dizziness, focal weakness, loss of consciousness, weakness and headaches.  Psychiatric/Behavioral: Negative for depression. The patient is not nervous/anxious.    DRUG ALLERGIES:   Allergies  Allergen Reactions  . Contrast Media [Iodinated Diagnostic Agents]     SOB  . Iohexol      Desc: Respiratory Distress, Laryngedema, diaphoresis, Onset Date: [id number]   . Latex Rash   VITALS:  Blood pressure (!) 145/82, pulse (!) 113, temperature 97.8 F (36.6 C), temperature source Oral, resp. rate 14, height 5\' 7"  (1.702 m), weight 91.5 kg (201 lb 12.8 oz), SpO2 96 %. PHYSICAL EXAMINATION:  Physical Exam  Constitutional: He is oriented to person, place, and time and well-developed, well-nourished, and in no distress.  HENT:  Head: Normocephalic.  Mouth/Throat: Oropharynx is clear and moist.  Eyes: Conjunctivae and EOM are normal. No scleral icterus.  Neck: Normal range of motion. Neck supple. No JVD  present. No tracheal deviation present. No thyromegaly present.  Cardiovascular: Normal rate and normal heart sounds.  Exam reveals no gallop.   No murmur heard. Irregular rhythm.  Pulmonary/Chest: Effort normal and breath sounds normal. No respiratory distress. He has no wheezes. He has no rales.  Abdominal: Soft. Bowel sounds are normal. He exhibits no distension. There is no tenderness. There is no rebound.  Musculoskeletal: Normal range of motion. He exhibits no edema or tenderness.  Dry gangrene this areas on left toes.  Neurological: He is alert and oriented to person, place, and time. No cranial nerve deficit.  Skin: No rash noted. No erythema.  Psychiatric: Affect normal.   LABORATORY PANEL:  Male CBC  Recent Labs Lab 09/04/16 0442  WBC 11.8*  HGB 15.0  HCT 44.1  PLT 175   ------------------------------------------------------------------------------------------------------------------ Chemistries   Recent Labs Lab 09/01/16 0402 09/04/16 0442  NA 138  --   K 3.7  --   CL 106  --   CO2 25  --   GLUCOSE 177*  --   BUN 34*  --   CREATININE 0.91 0.72  CALCIUM 8.3*  --   MG 2.0  --    RADIOLOGY:  No results found. ASSESSMENT AND PLAN:   82 year old male patient with a nonhealing ulcers of the left foot scheduled to have angiogram of the left leg today but patient developed A. fib with RVR.  # A. fib with RVR Advancing metoprolol and Cardizem, follow in the morning.  Metoprolol dose increased yesterday, Cardizem today. He continues to have rapid ventricular rate, especially on exertion. The patient was seen by Dr. Clayborn Bigness, recommended  to continue medical therapy for now.  On Coumadin. Lovenox bridging. Discussed with cardiologist, Dr. Clayborn Bigness  # Diabetes mellitus type 2: Continue home medicines Sliding scale insulin  # left leg  PAD;(previous angioplasty in the left leg on March 6 status post angioplasty of left tibial artery, distal left SFA, continued  to have a nonhealing wound ulcers, podiatry following Angiogram showed thrombus in SFA and popliteal aretry stent. S/P lytic therapy,  stent, patient continues with some leg pain, patient was seen by vascular surgeon today, recommended to continue current therapy, felt to be improved perfusion Lovenox bridging. Started Coumadin, INR is about 1.7, likely therapeutic tomorrow. Continue neuro checks  # Allergy to contrast: Patient received a steroids.  # chronic pain in the left leg continue pain medicines,  # Leukocytosis, likely due to steroids, improving since admission  #Acute renal insufficiency, resolved with IV fluid administration  # Diabetes mellitus, advance insulin Lantus to 20 units daily at bedtime, continue sliding scale insulin, follow closely and advance medications as needed   All the records are reviewed and case discussed with Care Management/Social Worker. Management plans discussed with the patient, family and they are in agreement.  CODE STATUS: Full Code  TOTAL TIME TAKING CARE OF THIS PATIENT: 35 minutes.  Discussed with Dr. Clayborn Bigness POSSIBLE D/C IN 1-2 DAYS, DEPENDING ON control of A. [name] M.D on 09/04/2016 at 2:51 PM  Between 7am to 6pm - Pager - [phone]  After 6pm go to [email or web address] - Technical brewer  Hospitalists  Office  [phone]  CC: Primary care physician; No PCP Per Patient  Note: This dictation was prepared with Dragon dictation along with smaller phrase technology. Any transcriptional errors that result from this process are unintentional.

## 2016-09-04 NOTE — Plan of Care (Signed)
Problem: Bowel/Gastric: Goal: Will not experience complications related to bowel motility Outcome: Not Progressing Biscodyl and Miralax given at noon for no stools since 4/10.  No results thus far.

## 2016-09-04 NOTE — Progress Notes (Signed)
[facility]  Daily Progress Note   Subjective: 3 Days Post-Op: Ultrasound guidance for vascular access right femoral artery, Catheter placement into left popliteal artery from right femoral approach, Selective left lower extremity angiogram, Catheter directed lytic therapy with 8 mg of TPA delivered into the left SFA and popliteal arteries, Mechanical thrombectomy with the Penumbra CAT 6 device to the left SFA and popliteal arteries, Covered stent placement to the left SFA and popliteal arteries with 6 mm diameter by 25 cm length Viabahn stent with StarClose closure device right femoral artery.  Patient sleeping comfortably in bed. Wife in room. Without complaint. Left foot pain continues to improve. Discussed angiogram results. Wife asking about wound care.   Objective: Vitals:   09/04/16 0755 09/04/16 0756 09/04/16 1218 09/04/16 1223  BP: (!) 157/94  (!) 145/82   Pulse: (!) 158 (!) 131 (!) 103 (!) 113  Resp: 18  14   Temp: 97.8 F (36.6 C)     TempSrc: Oral     SpO2: (!) 87%  96%   Weight:      Height:        Intake/Output Summary (Last 24 hours) at 09/04/16 1356 Last data filed at 09/04/16 0000  Gross per 24 hour  Intake              480 ml  Output                0 ml  Net              480 ml   Physical Exam: A&Ox3, NAD CV: Irregularly Irregular  Pulmonary: CTA Bilaterally Abdomen: Soft, Nontender, Nondistended Groin: Healing well. No drainage. No swelling. Vascular:   Left Lower Extremity: Wound stable. Foot warmer and less pale when prior to intervention.    Laboratory: CBC    Component Value Date/Time   WBC 11.8 (H) 09/04/2016 0442   HGB 15.0 09/04/2016 0442   HGB 16.3 01/20/2013 0255   HCT 44.1 09/04/2016 0442   HCT 47.9 01/20/2013 0255   PLT 175 09/04/2016 0442   PLT 245 01/20/2013 0255   BMET    Component Value Date/Time   NA 138 09/01/2016 0402   NA 140 01/20/2013 0255   K 3.7 09/01/2016 0402   K 3.5 01/20/2013 0255   CL 106  09/01/2016 0402   CL 103 01/20/2013 0255   CO2 25 09/01/2016 0402   CO2 30 01/20/2013 0255   GLUCOSE 177 (H) 09/01/2016 0402   GLUCOSE 145 (H) 01/20/2013 0255   BUN 34 (H) 09/01/2016 0402   BUN 22 (H) 01/20/2013 0255   CREATININE 0.72 09/04/2016 0442   CREATININE 1.09 01/20/2013 0255   CALCIUM 8.3 (L) 09/01/2016 0402   CALCIUM 9.4 01/20/2013 0255   GFRNONAA >60 09/04/2016 0442   GFRNONAA >60 01/20/2013 0255   GFRAA >60 09/04/2016 0442   GFRAA >60 01/20/2013 0255   Assessment/Planning: 82 year old male with PAD and ulceration to LLE foot s/p LLE intervention due to thrombosis of original stent - improvement  1) Left foot with improved perfusion. Will wait and see how ulcerations heal. Patient to follow up as outpatient.  2) Will order wound consult for outpatient wound care recommendations. Patient has home nursing services that will need to restart for wound care.  3) Cardiac care as per medicine / cards.  Marcelle Overlie PA-C 09/04/2016 1:56 PM

## 2016-09-04 NOTE — Progress Notes (Signed)
Heart remains elevated.  Dr. Clayborn Bigness called to check on patient, informed of continued elevated heart rate.  Ordered Digoxin .5mg  now

## 2016-09-04 NOTE — Evaluation (Signed)
Physical Therapy Evaluation Patient Details Name: [name] MRN: [id number] DOB: [birth year] Today's Date: 09/04/2016   History of Present Illness  82 yo male with onset of pain from clotted stent, revascularized with angioplasty and now referred to PT. Also having a-fib with RVR, leukocytosis. PMHx:   DM, PAD, chronic leg pain, leukocytosis, acute renal insufficiency  Clinical Impression  Pt is up to walk after sitting bedside, extremely diaphoretic and changed gown to manage the issue.  Painful to stand but O2 sat 97% and pulses are more controlled in 80's, able to walk and get into chair for a time with chair alarm in place.  Will follow acutely and progress his activity as tolerated, to increase his strength and standing endurance and transition to SNF as needed, but if able to walk with more independence can change this plan to HHPT.    Follow Up Recommendations SNF    Equipment Recommendations  Rolling walker with 5" wheels    Recommendations for Other Services       Precautions / Restrictions Precautions Precautions: Fall (telemetry) Restrictions Other Position/Activity Restrictions: WBAT      Mobility  Bed Mobility Overal bed mobility: Needs Assistance Bed Mobility: Supine to Sit     Supine to sit: Mod assist     General bed mobility comments: help to sit up bedside due to pain and weakness LLE  Transfers Overall transfer level: Needs assistance Equipment used: Rolling walker (2 wheeled);1 person hand held assist Transfers: Sit to/from [facility] Sit to Stand: Min assist Stand pivot transfers: Min assist       General transfer comment: reminded for sequencing and to use hands to lower to chair  Ambulation/Gait Ambulation/Gait assistance: Min guard;Min assist Ambulation Distance (Feet): 35 Feet Assistive device: Rolling walker (2 wheeled);1 person hand held assist Gait Pattern/deviations: Step-through pattern;Shuffle;Decreased stride  length;Trunk flexed;Wide base of support Gait velocity: reduced Gait velocity interpretation: Below normal speed for age/gender General Gait Details: avoiding pressure on LLE with walker  Stairs            Wheelchair Mobility    Modified Rankin (Stroke Patients Only)       Balance Overall balance assessment: Needs assistance Sitting-balance support: Feet supported Sitting balance-Leahy Scale: Fair     Standing balance support: Bilateral upper extremity supported Standing balance-Leahy Scale: Poor                               Pertinent Vitals/Pain Pain Assessment: Faces Faces Pain Scale: Hurts little more Pain Location: L leg Pain Descriptors / Indicators: Sore Pain Intervention(s): Limited activity within patient's tolerance;Monitored during session;Premedicated before session;Repositioned    Home Living Family/patient expects to be discharged to:: Private residence Living Arrangements: Spouse/significant other Available Help at Discharge: Family;Available 24 hours/day Type of Home: House Home Access: Level entry     Home Layout: One level Home Equipment: Walker - 2 wheels      Prior Function Level of Independence: Independent with assistive device(s)         Comments: requires walker for comfort of LLE lately     Hand Dominance        Extremity/Trunk Assessment   Upper Extremity Assessment Upper Extremity Assessment: Overall WFL for tasks assessed    Lower Extremity Assessment Lower Extremity Assessment: Generalized weakness    Cervical / Trunk Assessment Cervical / Trunk Assessment: Other exceptions (spine is not well mobilized, stiff)  Communication  Communication: No difficulties  Cognition Arousal/Alertness: Lethargic Behavior During Therapy: Flat affect Overall Cognitive Status: Impaired/Different from baseline Area of Impairment: Orientation;Attention;Memory;Following commands;Safety/judgement;Awareness;Problem  solving                 Orientation Level: Situation Current Attention Level: Sustained Memory: Decreased short-term memory Following Commands: Follows one step commands with increased time Safety/Judgement: Decreased awareness of safety;Decreased awareness of deficits Awareness: Intellectual Problem Solving: Decreased initiation;Requires verbal cues        General Comments      Exercises     Assessment/Plan    PT Assessment Patient needs continued PT services  PT Problem List Decreased strength;Decreased range of motion;Decreased activity tolerance;Decreased balance;Decreased mobility;Decreased knowledge of use of DME;Decreased safety awareness;Obesity;Decreased skin integrity;Pain       PT Treatment Interventions DME instruction;Gait training;Stair training;Functional mobility training;Therapeutic activities;Therapeutic exercise;Balance training;Neuromuscular re-education;Patient/family education    PT Goals (Current goals can be found in the Care Plan section)  Acute Rehab PT Goals Patient Stated Goal: none stated PT Goal Formulation: With patient Time For Goal Achievement: 09/18/16 Potential to Achieve Goals: Good    Frequency Min 2X/week   Barriers to discharge Inaccessible home environment;Decreased caregiver support      Co-evaluation               End of Session Equipment Utilized During Treatment: Gait belt Activity Tolerance: Patient tolerated treatment well;Patient limited by fatigue Patient left: in chair;with call bell/phone within reach;with chair alarm set Nurse Communication: Mobility status PT Visit Diagnosis: Muscle weakness (generalized) (M62.81);Difficulty in walking, not elsewhere classified (R26.2);Pain Pain - Right/Left: Left Pain - part of body: Leg    Time: 7972-8206 PT Time Calculation (min) (ACUTE ONLY): 38 min   Charges:   PT Evaluation $PT Eval Moderate Complexity: 1 Procedure PT Treatments $Gait Training: 8-22  mins   PT G Codes:   PT G-Codes **NOT FOR INPATIENT CLASS** Functional Assessment Tool Used: AM-PAC 6 Clicks Basic Mobility;Clinical judgement Functional Limitation: Mobility: Walking and moving around Mobility: Walking and Moving Around Current Status (O1561): At least 20 percent but less than 40 percent impaired, limited or restricted Mobility: Walking and Moving Around Goal Status [phone]): At least 1 percent but less than 20 percent impaired, limited or restricted    Ramond Dial 09/04/2016, 4:31 PM   Mee Hives, PT MS Acute Rehab Dept. Number: [place] and [place]

## 2016-09-04 NOTE — Progress Notes (Signed)
Wounds between his toes cleansed with NS, dried, Telfa non adherent gauze placed between the 4th and 5th toes on his left foot.  Wounds are clean, no drainage.

## 2016-09-04 NOTE — Progress Notes (Signed)
[place] for warfarin Indication: atrial fibrillation  Allergies  Allergen Reactions  . Contrast Media [Iodinated Diagnostic Agents]     SOB  . Iohexol      Desc: Respiratory Distress, Laryngedema, diaphoresis, Onset Date: [id number]   . Latex Rash    Patient Measurements: Height: 5\' 7"  (170.2 cm) Weight: 201 lb 12.8 oz (91.5 kg) IBW/kg (Calculated) : 66.1  Vital Signs: Temp: 97.8 F (36.6 C) (04/14 0755) Temp Source: Oral (04/14 0755) BP: 157/94 (04/14 0755) Pulse Rate: 131 (04/14 0756)  Labs:  Recent Labs  09/03/16 0713 09/04/16 0442  HGB  --  15.0  HCT  --  44.1  PLT  --  175  LABPROT 17.7* 20.4*  INR 1.44 1.72  CREATININE  --  0.72    Estimated Creatinine Clearance: 80.8 mL/min (by C-G formula based on SCr of 0.72 mg/dL).   Medical History: Past Medical History:  Diagnosis Date  . Arthritis   . Atrial fibrillation ([place])   . Carcinoma of prostate ([place])   . CHF (congestive heart failure) ([place])   . Diabetes mellitus without complication ([place])   . ED (erectile dysfunction)   . Frequent urination   . Hypertension   . Peripheral vascular disease ([place])   . Pneumonia 04/2016  . Prostate cancer ([place])   . Sleep apnea    OSA--USE C-PAP  . Urinary stress incontinence, male    Assessment: Pharmacy consulted to dose and monitor warfarin in this 82 year old male who was taking warfarin prior to admission for atrial fibrillation. Patient presented for scheduled angiogram so warfarin had been held for several days prior to admission and INR subtherapeutic.   Home warfarin dose: 5 mg PO daily  Dosing history: Date INR Dose 4/11 1.21 5 mg 4/12 -- 5 mg 4/13 1.44 5 mg 4/14 1.72   Goal of Therapy:  INR 2-3 Monitor platelets by anticoagulation protocol: Yes   Plan:  INR = 1.72 remains subtherapeutic but is increasing.   Continue current home dose of warfarin 5 mg PO daily with enoxaparin 1 mg/kg subQ BID bridge until  INR is therapeutic.   Will take ~5 days to see effects of resuming warfarin on INR since warfarin was held several days for surgical procedure.  Noralee Space, PharmD Clinical Pharmacist 09/04/2016,10:21 AM

## 2016-09-04 NOTE — Progress Notes (Signed)
PT Cancellation Note  Patient Details Name: [name] MRN: [id number] DOB: 26-Jan-1935   Cancelled Treatment:    Reason Eval/Treat Not Completed: Medical issues which prohibited therapy.  Very elevated HR and low O2 sats, check later to see how pt is doing.   Ramond Dial 09/04/2016, 10:01 AM   Mee Hives, PT MS Acute Rehab Dept. Number: [place] and [place]

## 2016-09-05 DIAGNOSIS — I70245 Atherosclerosis of native arteries of left leg with ulceration of other part of foot: Secondary | ICD-10-CM | POA: Diagnosis not present

## 2016-09-05 LAB — GLUCOSE, CAPILLARY
Glucose-Capillary: 144 mg/dL — ABNORMAL HIGH (ref 65–99)
Glucose-Capillary: 211 mg/dL — ABNORMAL HIGH (ref 65–99)
Glucose-Capillary: 111 mg/dL — ABNORMAL HIGH (ref 65–99)
Glucose-Capillary: 113 mg/dL — ABNORMAL HIGH (ref 65–99)

## 2016-09-05 LAB — PROTIME-INR
INR: 1.97
PROTHROMBIN TIME: 22.7 s — AB (ref 11.4–15.2)

## 2016-09-05 MED ORDER — INSULIN GLARGINE 100 UNIT/ML ~~LOC~~ SOLN
24.0000 [IU] | Freq: Every day | SUBCUTANEOUS | Status: DC
Start: 2016-09-05 — End: 2016-09-06
  Administered 2016-09-05: 24 [IU] via SUBCUTANEOUS
  Filled 2016-09-05 (×2): qty 0.24

## 2016-09-05 NOTE — Clinical Social Work Note (Signed)
CSW has received multiple consults. CSW is following and will assess when able.  Santiago Bumpers, MSW, Latanya Presser [phone]

## 2016-09-05 NOTE — NC FL2 (Signed)
[place] LEVEL OF CARE SCREENING TOOL     IDENTIFICATION  Patient Name: [name] Birthdate: 1935-04-18 Sex: male Admission Date (Current Location): 08/30/2016  [place] and [place] Number:  Engineering geologist and Address:  [facility], [address] [place], [place] [address]      Provider Number: [id number]  Attending Physician Name and Address:  Theodoro Grist, MD  Relative Name and Phone Number:       Current Level of Care: Hospital Recommended Level of Care: [place] Prior Approval Number:    Date Approved/Denied: 09/05/16 PASRR Number: [id number]  Discharge Plan: SNF    Current Diagnoses: Patient Active Problem List   Diagnosis Date Noted  . A-fib ([place]) 08/30/2016  . Pneumonia 05/19/2016  . Hyperlipidemia 04/20/2016  . Leg pain, left 04/19/2016  . Back pain 04/19/2016  . Type 2 diabetes mellitus treated with insulin ([place]) 04/19/2016  . Essential hypertension 04/19/2016  . PAD (peripheral artery disease) ([place]) 04/19/2016  . Erectile dysfunction following radical prostatectomy 06/25/2015  . History of prostate cancer 12/23/2014  . Incontinence 12/23/2014    Orientation RESPIRATION BLADDER Height & Weight     Self, Time, Situation  Normal Continent Weight: 197 lb (89.4 kg) Height:  5\' 7"  (170.2 cm)  BEHAVIORAL SYMPTOMS/MOOD NEUROLOGICAL BOWEL NUTRITION STATUS      Continent Diet (Heart Healthy, Carb Controlled)  AMBULATORY STATUS COMMUNICATION OF NEEDS Skin   Extensive Assist Verbally Normal                       Personal Care Assistance Level of Assistance  Bathing, Feeding, Dressing Bathing Assistance: Limited assistance Feeding assistance: Independent Dressing Assistance: Limited assistance     Functional Limitations Info             SPECIAL CARE FACTORS FREQUENCY  PT (By licensed PT)     PT Frequency: Up to 5X per day, 5 days per week              Contractures  Contractures Info: Present    Additional Factors Info  Allergies   Allergies Info:  Contrast Media Iodinated Diagnostic Agents, Iohexol, Latex           Current Medications (09/05/2016):  This is the current hospital active medication list Current Facility-Administered Medications  Medication Dose Route Frequency Provider Last Rate Last Dose  . acetaminophen (TYLENOL) tablet 650 mg  650 mg Oral Q6H PRN Epifanio Lesches, MD   650 mg at 09/04/16 0654   Or  . acetaminophen (TYLENOL) suppository 650 mg  650 mg Rectal Q6H PRN Epifanio Lesches, MD      . aspirin EC tablet 81 mg  81 mg Oral Daily Epifanio Lesches, MD   81 mg at 09/05/16 0801  . atorvastatin (LIPITOR) tablet 80 mg  80 mg Oral q1800 Hillary Bow, MD   80 mg at 09/04/16 1715  . bisacodyl (DULCOLAX) EC tablet 5 mg  5 mg Oral Daily PRN Epifanio Lesches, MD   5 mg at 09/04/16 1219  . ceFAZolin (ANCEF) 2 g in dextrose 5 % 100 mL IVPB  2 g Intravenous Once [employer], PA-C      . diltiazem (CARDIZEM CD) 24 hr capsule 240 mg  240 mg Oral BID Theodoro Grist, MD   240 mg at 09/05/16 0802  . docusate sodium (COLACE) capsule 100 mg  100 mg Oral BID Epifanio Lesches, MD   100 mg at 09/05/16 0802  .  furosemide (LASIX) tablet 20 mg  20 mg Oral Daily Epifanio Lesches, MD   20 mg at 09/05/16 0802  . HYDROmorphone (DILAUDID) injection 1 mg  1 mg Intravenous Once PRN Kimberly A Stegmayer, PA-C      . insulin aspart (novoLOG) injection 0-15 Units  0-15 Units Subcutaneous TID WC Epifanio Lesches, MD   5 Units at 09/05/16 1205  . insulin glargine (LANTUS) injection 24 Units  24 Units Subcutaneous QHS Theodoro Grist, MD      . irbesartan (AVAPRO) tablet 37.5 mg  37.5 mg Oral Daily Epifanio Lesches, MD   37.5 mg at 09/05/16 0801  . metFORMIN (GLUCOPHAGE) tablet 1,000 mg  1,000 mg Oral BID WC Hillary Bow, MD   1,000 mg at 09/05/16 0802  . metoprolol (LOPRESSOR) injection 5 mg  5 mg Intravenous Q4H PRN Saundra Shelling, MD    5 mg at 09/04/16 0612  . metoprolol (LOPRESSOR) tablet 100 mg  100 mg Oral BID Hillary Bow, MD   100 mg at 09/05/16 0801  . ondansetron (ZOFRAN) tablet 4 mg  4 mg Oral Q6H PRN Epifanio Lesches, MD       Or  . ondansetron (ZOFRAN) injection 4 mg  4 mg Intravenous Q6H PRN Epifanio Lesches, MD   4 mg at 09/04/16 1924  . oxyCODONE-acetaminophen (PERCOCET/ROXICET) 5-325 MG per tablet 1 tablet  1 tablet Oral Q4H PRN Epifanio Lesches, MD   1 tablet at 09/05/16 0801  . polyethylene glycol (MIRALAX / GLYCOLAX) packet 17 g  17 g Oral Daily PRN Srikar Sudini, MD      . polyethylene glycol (MIRALAX / GLYCOLAX) packet 17 g  17 g Oral Daily PRN Srikar Sudini, MD      . sodium chloride flush (NS) 0.9 % injection 3 mL  3 mL Intravenous Q12H Srikar Sudini, MD   3 mL at 09/05/16 1000  . traZODone (DESYREL) tablet 25 mg  25 mg Oral QHS PRN Epifanio Lesches, MD      . warfarin (COUMADIN) tablet 5 mg  5 mg Oral q1800 Algernon Huxley, MD   5 mg at 09/04/16 1715  . Warfarin - Pharmacist Dosing Inpatient   Does not apply [place], [facility]         Discharge Medications: Please see discharge summary for a list of discharge medications.  Relevant Imaging Results:  Relevant Lab Results:   Additional Information SS# [id number]  Zettie Pho, LCSW

## 2016-09-05 NOTE — Consult Note (Signed)
[place] Nurse wound consult note Reason for Consult:Patient consult received for new orders post stent placement and improved circulation. Patient with intermittent pain (claudication) when ambulating to BR this am. Wound type:arterial insufficiency Pressure Injury POA: No Measurement: See note from K. Sanders dated 08/30/16 Wound bed:dry eschar Drainage (amount, consistency, odor) none Periwound:intact, ruberous Dressing procedure/placement/frequency: I will implement a POC to support the moistening of the necrotic tissue gradually as the arterial blood flow improves using xeroform gauze.  It will hydrate and donate an antimicrobial property simultaneously. Daily dressings are sufficient, patient will benefit from [facility] for the first weeks post discharge to teach dressing change procedure, observe for proper technique at home, follow up teaching regarding the consistent use of protective shoe wear at home and for wound response until patient's first MD appointment.  If you agree, please order HHRN. [place] nursing team will not follow, but will remain available to this patient, the nursing and medical teams.  Please re-consult if needed. Thanks, Maudie Flakes, MSN, RN, [place], Arther Abbott  Pager# [phone]

## 2016-09-05 NOTE — Progress Notes (Signed)
[place] for warfarin Indication: atrial fibrillation  Allergies  Allergen Reactions  . Contrast Media [Iodinated Diagnostic Agents]     SOB  . Iohexol      Desc: Respiratory Distress, Laryngedema, diaphoresis, Onset Date: 16606004   . Latex Rash    Patient Measurements: Height: 5\' 7"  (170.2 cm) Weight: 197 lb (89.4 kg) IBW/kg (Calculated) : 66.1  Vital Signs: Temp: 97.8 F (36.6 C) (04/15 0500) Temp Source: Oral (04/15 0500) BP: 140/72 (04/15 0500) Pulse Rate: 81 (04/15 0500)  Labs:  Recent Labs  09/03/16 0713 09/04/16 0442 09/05/16 0732  HGB  --  15.0  --   HCT  --  44.1  --   PLT  --  175  --   LABPROT 17.7* 20.4* 22.7*  INR 1.44 1.72 1.97  CREATININE  --  0.72  --     Estimated Creatinine Clearance: 79.9 mL/min (by C-G formula based on SCr of 0.72 mg/dL).   Medical History: Past Medical History:  Diagnosis Date  . Arthritis   . Atrial fibrillation ([place])   . Carcinoma of prostate ([place])   . CHF (congestive heart failure) ([place])   . Diabetes mellitus without complication ([place])   . ED (erectile dysfunction)   . Frequent urination   . Hypertension   . Peripheral vascular disease ([place])   . Pneumonia 04/2016  . Prostate cancer ([place])   . Sleep apnea    OSA--USE C-PAP  . Urinary stress incontinence, male    Assessment: Pharmacy consulted to dose and monitor warfarin in this 82 year old male who was taking warfarin prior to admission for atrial fibrillation. Patient presented for scheduled angiogram so warfarin had been held for several days prior to admission and INR subtherapeutic.   Home warfarin dose: 5 mg PO daily  Dosing history: Date INR Dose 4/11 1.21 5 mg 4/12 -- 5 mg 4/13 1.44 5 mg 4/14 1.72 5 mg 4/15 1.97  Goal of Therapy:  INR 2-3 Monitor platelets by anticoagulation protocol: Yes   Plan:  INR = 1.97 slightly subtherapeutic but is increasing.   Continue current home dose of warfarin 5 mg PO  daily with enoxaparin 1 mg/kg subQ BID bridge until INR is therapeutic.   Will take ~5 days to see effects of resuming warfarin on INR since warfarin was held several days for surgical procedure.  Noralee Space, PharmD Clinical Pharmacist 09/05/2016,10:39 AM

## 2016-09-05 NOTE — Plan of Care (Signed)
Problem: Tissue Perfusion: Goal: Risk factors for ineffective tissue perfusion will decrease Outcome: Progressing LLE with faintly palpable pulses.  +Doppler pulses noted.

## 2016-09-05 NOTE — Progress Notes (Signed)
[place] [facility] [place] NAME: [name]    MR#:  [id number]  DATE OF BIRTH:  [birth year]  SUBJECTIVE:  CHIEF COMPLAINT:  No chief complaint on file.  Left foot pain has  improved. The patient was seen by wound care specialist, recommended Xeroform gauze, daily dressings, home health RN after discharge, protective shoe at home  Heart rate has improved with advancement of Cardizem and metoprolol, in 80s to 90s now. Patient feels good, wants to go home The patient was evaluated by physical therapist and recommended skilled nursing facility placement, for which he is agreeable   REVIEW OF SYSTEMS:  Review of Systems  Constitutional: Negative for chills, fever and malaise/fatigue.  HENT: Negative for congestion.   Eyes: Negative for blurred vision and double vision.  Respiratory: Negative for cough, shortness of breath, wheezing and stridor.   Cardiovascular: Negative for chest pain, palpitations and leg swelling.  Gastrointestinal: Negative for abdominal pain, blood in stool, constipation, diarrhea, melena, nausea and vomiting.  Genitourinary: Negative for dysuria, hematuria and urgency.  Musculoskeletal: Positive for joint pain. Negative for back pain.       Left foot pain.  Skin: Negative for itching and rash.  Neurological: Negative for dizziness, focal weakness, loss of consciousness, weakness and headaches.  Psychiatric/Behavioral: Negative for depression. The patient is not nervous/anxious.    DRUG ALLERGIES:   Allergies  Allergen Reactions  . Contrast Media [Iodinated Diagnostic Agents]     SOB  . Iohexol      Desc: Respiratory Distress, Laryngedema, diaphoresis, Onset Date: [id number]   . Latex Rash   VITALS:  Blood pressure 94/62, pulse 83, temperature 97.6 F (36.4 C), temperature source Oral, resp. rate 17, height 5\' 7"  (1.702 m), weight 89.4 kg (197 lb), SpO2 92 %. PHYSICAL EXAMINATION:  Physical Exam  Constitutional:  He is oriented to person, place, and time and well-developed, well-nourished, and in no distress.  HENT:  Head: Normocephalic.  Mouth/Throat: Oropharynx is clear and moist.  Eyes: Conjunctivae and EOM are normal. No scleral icterus.  Neck: Normal range of motion. Neck supple. No JVD present. No tracheal deviation present. No thyromegaly present.  Cardiovascular: Normal rate and normal heart sounds.  Exam reveals no gallop.   No murmur heard. Irregular rhythm.  Pulmonary/Chest: Effort normal and breath sounds normal. No respiratory distress. He has no wheezes. He has no rales.  Abdominal: Soft. Bowel sounds are normal. He exhibits no distension. There is no tenderness. There is no rebound.  Musculoskeletal: Normal range of motion. He exhibits no edema or tenderness.  Dry gangrene this areas on left toes.  Neurological: He is alert and oriented to person, place, and time. No cranial nerve deficit.  Skin: No rash noted. No erythema.  Psychiatric: Affect normal.   LABORATORY PANEL:  Male CBC  Recent Labs Lab 09/04/16 0442  WBC 11.8*  HGB 15.0  HCT 44.1  PLT 175   ------------------------------------------------------------------------------------------------------------------ Chemistries   Recent Labs Lab 09/01/16 0402 09/04/16 0442  NA 138  --   K 3.7  --   CL 106  --   CO2 25  --   GLUCOSE 177*  --   BUN 34*  --   CREATININE 0.91 0.72  CALCIUM 8.3*  --   MG 2.0  --    RADIOLOGY:  No results found. ASSESSMENT AND PLAN:   82 year old male patient with a nonhealing ulcers of the left foot scheduled to have angiogram of the left leg  today but patient developed A. fib with RVR.  # A. fib with RVR Improved on advanced doses of metoprolol and Cardizem, follow as outpatient. The patient was seen by Dr. Clayborn [name], recommended to continue medical therapy for now.  On Coumadin, discontinue Lovenox, as patient is nearly therapeutic, and will be therapeutic after evening dose  of Coumadin.   # Diabetes mellitus type 2: Continue home medicines Sliding scale insulin  # left leg  PAD;(previous angioplasty in the left leg on March 6 status post angioplasty of left tibial artery, distal left SFA, continued to have a nonhealing wound ulcers, podiatry following Angiogram showed thrombus in SFA and popliteal aretry stent. S/P lytic therapy,  stent, patient continues with some leg pain, patient was seen by vascular surgeon , recommended to continue current therapy, felt to be improved perfusion Continue Coumadin, INR is about 1.97, likely therapeutic after evening dose of Coumadin, discontinue Lovenox. Continue neuro checks. The patient was seen by wound care specialist, recommended Xeroform gauzes, home health services, wound care as outpatient  # Allergy to contrast: Patient received a steroids.  # chronic pain in the left leg continue pain medicines,  # Leukocytosis, likely due to steroids, improving since admission  #Acute renal insufficiency, resolved with IV fluid administration  # Diabetes mellitus, advance insulin Lantus to 24 units daily at bedtime, continue sliding scale insulin, follow closely and advance medications as needed  #. Generalized weakness, patient was evaluated by physical therapist, skilled nursing facility placement for rehabilitation was recommended, consult social worker for placement   All the records are reviewed and case discussed with Care Management/Social Worker. Management plans discussed with the patient, family and they are in agreement.  CODE STATUS: Full Code  TOTAL TIME TAKING CARE OF THIS PATIENT: 35 minutes.    POSSIBLE D/C IN 1-2 DAYS, DEPENDING ON clinical nursing facility bed issues  [name] M.D on 09/05/2016 at 3:37 PM  Between 7am to 6pm - Pager - [phone]  After 6pm go to [email or web address] - Technical brewer [facility]  Office  [phone]  CC: Primary care physician; No  PCP Per Patient  Note: This dictation was prepared with Dragon dictation along with smaller phrase technology. Any transcriptional errors that result from this process are unintentional.

## 2016-09-06 DIAGNOSIS — R531 Weakness: Secondary | ICD-10-CM

## 2016-09-06 DIAGNOSIS — D72829 Elevated white blood cell count, unspecified: Secondary | ICD-10-CM

## 2016-09-06 DIAGNOSIS — Z8679 Personal history of other diseases of the circulatory system: Secondary | ICD-10-CM

## 2016-09-06 DIAGNOSIS — N179 Acute kidney failure, unspecified: Secondary | ICD-10-CM

## 2016-09-06 DIAGNOSIS — I70245 Atherosclerosis of native arteries of left leg with ulceration of other part of foot: Secondary | ICD-10-CM | POA: Diagnosis not present

## 2016-09-06 DIAGNOSIS — M79605 Pain in left leg: Secondary | ICD-10-CM

## 2016-09-06 HISTORY — DX: Elevated white blood cell count, unspecified: D72.829

## 2016-09-06 LAB — PROTIME-INR
INR: 2.09
Prothrombin Time: 23.8 seconds — ABNORMAL HIGH (ref 11.4–15.2)

## 2016-09-06 LAB — GLUCOSE, CAPILLARY
GLUCOSE-CAPILLARY: 154 mg/dL — AB (ref 65–99)
Glucose-Capillary: 113 mg/dL — ABNORMAL HIGH (ref 65–99)

## 2016-09-06 MED ORDER — DILTIAZEM HCL ER COATED BEADS 240 MG PO CP24: 240.0000 mg | ORAL_CAPSULE | Freq: Two times a day (BID) | ORAL | 3 refills | Status: DC

## 2016-09-06 MED ORDER — DOCUSATE SODIUM 100 MG PO CAPS: 100.0000 mg | ORAL_CAPSULE | Freq: Two times a day (BID) | ORAL | 3 refills | Status: DC

## 2016-09-06 MED ORDER — OXYCODONE-ACETAMINOPHEN 5-325 MG PO TABS
1.0000 | ORAL_TABLET | ORAL | 0 refills | Status: DC | PRN
Start: 2016-09-06 — End: 2016-10-15

## 2016-09-06 MED ORDER — METOPROLOL TARTRATE 100 MG PO TABS: 100.0000 mg | ORAL_TABLET | Freq: Two times a day (BID) | ORAL | 3 refills | Status: DC

## 2016-09-06 MED ORDER — INSULIN GLARGINE 100 UNIT/ML ~~LOC~~ SOLN: 24.0000 [IU] | Freq: Every day | SUBCUTANEOUS | 11 refills | Status: DC

## 2016-09-06 MED ORDER — POLYETHYLENE GLYCOL 3350 17 G PO PACK: 17.0000 g | PACK | Freq: Every day | ORAL | 0 refills | Status: DC | PRN

## 2016-09-06 NOTE — Clinical Social Work Placement (Signed)
   CLINICAL SOCIAL WORK PLACEMENT  NOTE  Date:  09/06/2016  Patient Details  Name: [name] MRN: [id number] Date of Birth: 1935-03-28  Clinical Social Work is seeking post-discharge placement for this patient at the [place] level of care (*CSW will initial, date and re-position this form in  chart as items are completed):  Yes   Patient/family provided with [place] Work Department's list of facilities offering this level of care within the geographic area requested by the patient (or if unable, by the patient's family).  Yes   Patient/family informed of their freedom to choose among providers that offer the needed level of care, that participate in Medicare, Medicaid or managed care program needed by the patient, have an available bed and are willing to accept the patient.  Yes   Patient/family informed of [place]'s ownership interest in [facility], as well as of the fact that they are under no obligation to receive care at these facilities.  PASRR submitted to EDS on 09/05/16     PASRR number received on 09/05/16     Existing PASRR number confirmed on       FL2 transmitted to all facilities in geographic area requested by pt/family on 09/05/16     FL2 transmitted to all facilities within larger geographic area on       Patient informed that his/her managed care company has contracts with or will negotiate with certain facilities, including the following:        Yes   Patient/family informed of bed offers received.  Patient [facility] bed at [facility]     Physician recommends and patient chooses bed at      Patient to be transferred to [facility] on 09/06/16.  Patient to be transferred to facility by [facility]     Patient family notified on 09/06/16 of transfer.  Name of family member notified:  [name], [name]       Additional  Comment:    _______________________________________________ Darden Dates, LCSW 09/06/2016, 12:22 PM

## 2016-09-06 NOTE — Clinical Social Work Note (Signed)
Clinical Social Work Assessment  Patient Details  Name: [name] MRN: [id number] Date of Birth: 1935/01/20  Date of referral:  09/06/16               Reason for consult:  Facility Placement, Discharge Planning                Permission sought to share information with:  Family Supports Permission granted to share information::  Yes, Verbal Permission Granted  Name::     Doroteo Nickolson  Relationship::  wife  Contact Information:  [phone]  Housing/Transportation Living arrangements for the past 2 months:  [place] of Information:  Patient, Spouse Patient Interpreter Needed:  None Criminal Activity/Legal Involvement Pertinent to Current Situation/Hospitalization:  No - Comment as needed Significant Relationships:  Spouse Lives with:  Spouse Do you feel safe going back to the place where you live?  Yes Need for family participation in patient care:  No (Coment)  Care giving concerns:  No care giving concern identified.   Social Worker assessment / plan:  CSW met with pt and spouse to address consult for New SNF. CSW introduced herself and explained role of social work. CSW also explained process of discharging to SNF. Bed search was initiated and CSW provided bed offers. Pt and spouse chose [facility]. CSW updated facility, and they are able to accept today. CSW also updated MD, who plans to discharge today. Pt and wife are aware. CSW will continue to follow.   Employment status:  Retired Nurse, adult PT Recommendations:  [place] / Referral to community resources:  [place]  Patient/Family's Response to care:  Pt and wife were appreciative of CSW support.  Patient/Family's Understanding of and Emotional Response to Diagnosis, Current Treatment, and Prognosis:  Pt and wife understand that pt would benefit from STR prior to returning home.   Emotional Assessment Appearance:   Appears stated age Attitude/Demeanor/Rapport:   (Appropriate) Affect (typically observed):  Accepting, Adaptable, Pleasant Orientation:  Oriented to Self, Oriented to Place, Oriented to  Time, Oriented to Situation Alcohol / Substance use:  Not Applicable Psych involvement (Current and /or in the community):  No (Comment)  Discharge Needs  Concerns to be addressed:  Adjustment to Illness Readmission within the last 30 days:  No Current discharge risk:  Chronically ill Barriers to Discharge:  No Barriers Identified   Darden Dates, LCSW 09/06/2016, 11:40 AM

## 2016-09-06 NOTE — Progress Notes (Signed)
Report called to Jana McNeil RN at [facility]. 

## 2016-09-06 NOTE — Care Management (Signed)
CM spoke with patient and his wife about skilled nursing placement.  They stated that "no one had mentioned this to them."  They are agreeable and facility placement would be [facility].  Patient's INR up to 2.09 today

## 2016-09-06 NOTE — Clinical Social Work Note (Signed)
Pt is ready for discharge today and will to [facility]. Pt and wife are aware and agreeable to discharge plan. Facility is ready to admit pt as they have received discharge information. RN will call report. [facility] will provide transportation. CSW is signing off as no further needs identified.   Darden Dates, MSW, LCSW  Clinical Social Worker  [phone]

## 2016-09-06 NOTE — Plan of Care (Signed)
Problem: Safety: Goal: Ability to remain free from injury will improve Outcome: Progressing Fall precautions, non skid socks when oob  Problem: Pain Managment: Goal: General experience of comfort will improve Outcome: Not Progressing Prn medications  Problem: Skin Integrity: Goal: Risk for impaired skin integrity will decrease Outcome: Progressing Daily dressing change to lt foot  Problem: Tissue Perfusion: Goal: Risk factors for ineffective tissue perfusion will decrease Outcome: Progressing PO coumadin  Problem: Activity: Goal: Risk for activity intolerance will decrease Outcome: Progressing Physical therapy involved

## 2016-09-06 NOTE — Discharge Summary (Addendum)
[facility] NAME: [name]    MR#:  [id number]  DATE OF BIRTH:  23-Oct-1934  DATE OF ADMISSION:  08/30/2016 ADMITTING PHYSICIAN: Algernon Huxley, MD  DATE OF DISCHARGE: No discharge date for patient encounter.  PRIMARY CARE PHYSICIAN: No PCP Per Patient     ADMISSION DIAGNOSIS:  LT lower extremity angio    ASO w ulcer PT ALLERGIC  CONTRAST DYE    LATEX PAD with ulceration  DISCHARGE DIAGNOSIS:  Active Problems:   A-fib ([place])   History of femoral angiogram   Left leg pain   Leukocytosis   Generalized weakness   Acute renal injury ([place])   SECONDARY DIAGNOSIS:   Past Medical History:  Diagnosis Date  . Arthritis   . Atrial fibrillation ([place])   . Carcinoma of prostate ([place])   . CHF (congestive heart failure) ([place])   . Diabetes mellitus without complication ([place])   . ED (erectile dysfunction)   . Frequent urination   . Hypertension   . Peripheral vascular disease ([place])   . Pneumonia 04/2016  . Prostate cancer ([place])   . Sleep apnea    OSA--USE C-PAP  . Urinary stress incontinence, male     .pro HOSPITAL COURSE:   Patient is a 82 year old Caucasian male with past medical history significant with peripheral vascular disease, left foot ulcerations, diabetes, hypertension, atrial fibrillation, who presented to the hospital for scheduled angiogram, however, noted to be in A. fib, RVR with a rate of 120 and higher. Procedure was canceled and patient was admitted to the hospital. He received 10 mg of IV metoprolol with no significant improvement of his heart rate. He was initiated on advanced doses of metoprolol and Cardizem with improvement of his heart rate .patient underwent angiogram 09/01/2016 by Dr. dew, lytic therapy with 8 mg of tPA delivered into left SFA and popliteal arteries, mechanical thrombectomy with penumbra cat 6 device to the left SFA and popliteal arteries was performed and stent placement to  left SFA and popliteal arteries with 6 mm diameter of 25 cm lungs stent . Postprocedure, patient did well, his left lower extremity pain improved . His Coumadin was restarted. Bridging with Lovenox, INR was therapeutic at 2.09 on the day of discharge . The patient was evaluated by physical therapist and recommended skilled nursing facility placement for rehabilitation  Discussion by problem:  # A. fib with RVR, Improved on advanced doses of metoprolol and Cardizem, follow as outpatient. The patient was seen by Dr. Clayborn Bigness, recommended to continue medical therapy for now. Continue Coumadin, INR is 2.09 today, on the day of discharge, follow-up as outpatient .   # Diabetes mellitus type 2: Continue insulin Lantus at 24 units subcutaneously daily, patient's fasting blood glucose level was 113 today in the morning, continue sliding scale insulin  # left leg PAD; status post angiogram 09/01/2016 by Dr. dew Angiogram showed thrombus in SFA and popliteal aretry stent. S/P lytic therapy,  stent was placed, patient continues with some left leg pain, patient was followed by vascular surgeon while in the hospital, recommended to continue current therapy, felt to do have improved perfusion. The patient was also evaluated by wound care specialist, recommended Xeroform gauzes in between toes, wound care follow-up as outpatient. Patient was managed on Lovenox and Coumadin bridge while in the hospital, now INR is therapeutic at 2.09, continue Coumadin therapy, following INR closely  #Allergy to contrast: Patient received a steroids prior to  vascular procedure.  # chronic pain in the left leg continue pain medications, physical therapy,  # Leukocytosis, likely due to steroids, improving since admission  #Acute renal insufficiency, resolved with IV fluid administration, follow as outpatient since he is back on Lasix  # Diabetes mellitus, now on advanced insulin Lantus dose at 24 units daily at bedtime,  continue sliding scale insulin, follow closely and advance medications as needed  #. Generalized weakness, patient was evaluated by physical therapist, skilled nursing facility placement for rehabilitation was recommended, discharging today   DISCHARGE CONDITIONS:   Stable  CONSULTS OBTAINED:  Treatment Team:  Hillary Bow, MD Isaias Cowman, MD  DRUG ALLERGIES:   Allergies  Allergen Reactions  . Contrast Media [Iodinated Diagnostic Agents]     SOB  . Iohexol      Desc: Respiratory Distress, Laryngedema, diaphoresis, Onset Date: [id number]   . Latex Rash    DISCHARGE MEDICATIONS:   Current Discharge Medication List    START taking these medications   Details  docusate sodium (COLACE) 100 MG capsule Take 1 capsule (100 mg total) by mouth 2 (two) times daily. Qty: 60 capsule, Refills: 3    insulin glargine (LANTUS) 100 UNIT/ML injection Inject 0.24 mLs (24 Units total) into the skin at bedtime. Qty: 10 mL, Refills: 11    metoprolol (LOPRESSOR) 100 MG tablet Take 1 tablet (100 mg total) by mouth 2 (two) times daily. Qty: 60 tablet, Refills: 3    polyethylene glycol (MIRALAX / GLYCOLAX) packet Take 17 g by mouth daily as needed for mild constipation. Qty: 14 each, Refills: 0      CONTINUE these medications which have CHANGED   Details  diltiazem (CARDIZEM CD) 240 MG 24 hr capsule Take 1 capsule (240 mg total) by mouth 2 (two) times daily. Qty: 60 capsule, Refills: 3    oxyCODONE-acetaminophen (PERCOCET/ROXICET) 5-325 MG tablet Take 1 tablet by mouth every 4 (four) hours as needed for moderate pain or severe pain. Qty: 30 tablet, Refills: 0      CONTINUE these medications which have NOT CHANGED   Details  diphenhydrAMINE (BENADRYL) 25 mg capsule Take 25 mg by mouth. Sunday night before procedure, and the day of procedure before coming to hospital.    furosemide (LASIX) 20 MG tablet TAKE 1 TABLET DAILY    metFORMIN (GLUCOPHAGE) 1000 MG tablet TAKE 1 TABLET  TWICE A DAY WITH MEALS    aspirin EC 81 MG tablet Take 1 tablet (81 mg total) by mouth daily. Qty: 150 tablet, Refills: 2    atorvastatin (LIPITOR) 80 MG tablet Take 80 mg by mouth daily at 6 PM.     warfarin (COUMADIN) 5 MG tablet Take 5 mg by mouth daily.      STOP taking these medications     glucose blood (ONE TOUCH ULTRA TEST) test strip      HUMALOG KWIKPEN 100 UNIT/ML KiwkPen      JARDIANCE 10 MG TABS tablet      metoprolol succinate (TOPROL-XL) 100 MG 24 hr tablet      valsartan (DIOVAN) 320 MG tablet      amLODipine-benazepril (LOTREL) 5-20 MG capsule      Blood Glucose Monitoring Suppl (FIFTY50 GLUCOSE METER 2.0) w/Device KIT      Blood Glucose Monitoring Suppl (ONE TOUCH ULTRA 2) W/DEVICE KIT      cephALEXin (KEFLEX) 500 MG capsule      Lancets 35C MISC      TRULICITY 1.5 YE/1.8HT SOPN  DISCHARGE INSTRUCTIONS:    The patient is to follow-up with primary care physician, cardiologist, vascular surgeon as outpatient  If you experience worsening of your admission symptoms, develop shortness of breath, life threatening emergency, suicidal or homicidal thoughts you must seek medical attention immediately by calling 911 or calling your MD immediately  if symptoms less severe.  You Must read complete instructions/literature along with all the possible adverse reactions/side effects for all the Medicines you take and that have been prescribed to you. Take any new Medicines after you have completely understood and accept all the possible adverse reactions/side effects.   Please note  You were cared for by a hospitalist during your hospital stay. If you have any questions about your discharge medications or the care you received while you were in the hospital after you are discharged, you can call the unit and asked to speak with the hospitalist on call if the hospitalist that took care of you is not available. Once you are discharged, your primary care  physician will handle any further medical issues. Please note that NO REFILLS for any discharge medications will be authorized once you are discharged, as it is imperative that you return to your primary care physician (or establish a relationship with a primary care physician if you do not have one) for your aftercare needs so that they can reassess your need for medications and monitor your lab values.    Today   CHIEF COMPLAINT:  No chief complaint on file.   HISTORY OF PRESENT ILLNESS:  [name]  is a 82 y.o. male with a known history of peripheral vascular disease, left foot ulcerations, diabetes, hypertension, atrial fibrillation, who presented to the hospital for scheduled angiogram, however, noted to be in A. fib, RVR with a rate of 120 and higher. Procedure was canceled and patient was admitted to the hospital. He received 10 mg of IV metoprolol with no significant improvement of his heart rate. He was initiated on advanced doses of metoprolol and Cardizem with improvement of his heart rate .patient underwent angiogram 09/01/2016 by Dr. dew, lytic therapy with 8 mg of tPA delivered into left SFA and popliteal arteries, mechanical thrombectomy with penumbra cat 6 device to the left SFA and popliteal arteries was performed and stent placement to left SFA and popliteal arteries with 6 mm diameter of 25 cm lungs stent . Postprocedure, patient did well, his left lower extremity pain improved . His Coumadin was restarted. Bridging with Lovenox, INR was therapeutic at 2.09 on the day of discharge . The patient was evaluated by physical therapist and recommended skilled nursing facility placement for rehabilitation  Discussion by problem:  # A. fib with RVR, Improved on advanced doses of metoprolol and Cardizem, follow as outpatient. The patient was seen by Dr. Clayborn Bigness, recommended to continue medical therapy for now. Continue Coumadin, INR is 2.09 today, on the day of discharge, follow-up as  outpatient .   # Diabetes mellitus type 2: Continue insulin Lantus at 24 units subcutaneously daily, patient's fasting blood glucose level was 113 today in the morning, continue sliding scale insulin  # left leg PAD; status post angiogram 09/01/2016 by Dr. dew Angiogram showed thrombus in SFA and popliteal aretry stent. S/P lytic therapy,  stent was placed, patient continues with some left leg pain, patient was followed by vascular surgeon while in the hospital, recommended to continue current therapy, felt to do have improved perfusion. The patient was also evaluated by wound care specialist, recommended Xeroform gauzes  in between toes, wound care follow-up as outpatient. Patient was managed on Lovenox and Coumadin bridge while in the hospital, now INR is therapeutic at 2.09, continue Coumadin therapy, following INR closely  #Allergy to contrast: Patient received a steroids prior to vascular procedure.  # chronic pain in the left leg continue pain medications, physical therapy,  # Leukocytosis, likely due to steroids, improving since admission  #Acute renal insufficiency, resolved with IV fluid administration, follow as outpatient since he is back on Lasix  # Diabetes mellitus, now on advanced insulin Lantus dose at 24 units daily at bedtime, continue sliding scale insulin, follow closely and advance medications as needed  #. Generalized weakness, patient was evaluated by physical therapist, skilled nursing facility placement for rehabilitation was recommended, discharging today   VITAL SIGNS:  Blood pressure (!) 109/47, pulse 82, temperature 97.5 F (36.4 C), temperature source Oral, resp. rate 18, height '5\' 7"'  (1.702 m), weight 89.1 kg (196 lb 8 oz), SpO2 93 %.  I/O:    Intake/Output Summary (Last 24 hours) at 09/06/16 1608 Last data filed at 09/06/16 1521  Gross per 24 hour  Intake              243 ml  Output                0 ml  Net              243 ml    PHYSICAL  EXAMINATION:  GENERAL:  82 y.o.-year-old patient lying in the bed with no acute distress.  EYES: Pupils equal, round, reactive to light and accommodation. No scleral icterus. Extraocular muscles intact.  HEENT: Head atraumatic, normocephalic. Oropharynx and nasopharynx clear.  NECK:  Supple, no jugular venous distention. No thyroid enlargement, no tenderness.  LUNGS: Normal breath sounds bilaterally, no wheezing, rales,rhonchi or crepitation. No use of accessory muscles of respiration.  CARDIOVASCULAR: S1, S2 normal. No murmurs, rubs, or gallops.  ABDOMEN: Soft, non-tender, non-distended. Bowel sounds present. No organomegaly or mass.  EXTREMITIES: No pedal edema, cyanosis, or clubbing.  NEUROLOGIC: Cranial nerves II through XII are intact. Muscle strength 5/5 in all extremities. Sensation intact. Gait not checked.  PSYCHIATRIC: The patient is alert and oriented x 3.  SKIN: No obvious rash, lesion, or ulcer.   DATA REVIEW:   CBC  Recent Labs Lab 09/04/16 0442  WBC 11.8*  HGB 15.0  HCT 44.1  PLT 175    Chemistries   Recent Labs Lab 09/01/16 0402 09/04/16 0442  NA 138  --   K 3.7  --   CL 106  --   CO2 25  --   GLUCOSE 177*  --   BUN 34*  --   CREATININE 0.91 0.72  CALCIUM 8.3*  --   MG 2.0  --     Cardiac Enzymes No results for input(s): TROPONINI in the last 168 hours.  Microbiology Results  Results for orders placed or performed during the hospital encounter of 05/18/16  Blood culture (routine x 2)     Status: None   Collection Time: 05/18/16 11:26 PM  Result Value Ref Range Status   Specimen Description BLOOD  L HAND  Final   Special Requests   Final    BOTTLES DRAWN AEROBIC AND ANAEROBIC  AER 14 ML ANA 12 ML   Culture NO GROWTH 5 DAYS  Final   Report Status 05/23/2016 FINAL  Final  Blood culture (routine x 2)     Status: None   Collection  Time: 05/18/16 11:27 PM  Result Value Ref Range Status   Specimen Description BLOOD  L AC  Final   Special Requests    Final    BOTTLES DRAWN AEROBIC AND ANAEROBIC  AER 12 ML ANA 12 ML   Culture NO GROWTH 5 DAYS  Final   Report Status 05/23/2016 FINAL  Final    RADIOLOGY:  No results found.  EKG:   Orders placed or performed in visit on 08/30/16  . EKG 12-Lead  . EKG 12-Lead  . EKG 12-Lead      Management plans discussed with the patient, family and they are in agreement.  CODE STATUS:     Code Status Orders        Start     Ordered   08/30/16 1337  Full code  Continuous     08/30/16 1338    Code Status History    Date Active Date Inactive Code Status Order ID Comments User Context   05/19/2016  4:54 AM 05/22/2016  7:07 PM Full Code [id number]  Saundra Shelling, MD Inpatient      TOTAL TIME TAKING CARE OF THIS PATIENT: 40 minutes.    Theodoro Grist M.D on 09/06/2016 at 4:08 PM  Between 7am to 6pm - Pager - [phone]  After 6pm go to [email or web address] - password [email or web address] [facility]  [facility]  Office  [phone]  CC: Primary care physician; No PCP Per Patient

## 2016-09-06 NOTE — Progress Notes (Signed)
EMS called for non emergent transport to [facility]. 

## 2016-09-06 NOTE — Progress Notes (Signed)
EMS here for non emergent transport to [facility]

## 2016-09-06 NOTE — Progress Notes (Signed)
[facility] for warfarin Indication: atrial fibrillation  Allergies  Allergen Reactions  . Contrast Media [Iodinated Diagnostic Agents]     SOB  . Iohexol      Desc: Respiratory Distress, Laryngedema, diaphoresis, Onset Date: [id number]   . Latex Rash    Patient Measurements: Height: 5\' 7"  (170.2 cm) Weight: 196 lb 8 oz (89.1 kg) IBW/kg (Calculated) : 66.1  Vital Signs: Temp: 97.5 F (36.4 C) (04/16 1134) Temp Source: Oral (04/16 1134) BP: 109/47 (04/16 1134) Pulse Rate: 82 (04/16 1134)  Labs:  Recent Labs  09/04/16 0442 09/05/16 0732 09/06/16 0430  HGB 15.0  --   --   HCT 44.1  --   --   PLT 175  --   --   LABPROT 20.4* 22.7* 23.8*  INR 1.72 1.97 2.09  CREATININE 0.72  --   --     Estimated Creatinine Clearance: 79.7 mL/min (by C-G formula based on SCr of 0.72 mg/dL).   Medical History: Past Medical History:  Diagnosis Date  . Arthritis   . Atrial fibrillation ([place])   . Carcinoma of prostate ([place])   . CHF (congestive heart failure) ([place])   . Diabetes mellitus without complication ([place])   . ED (erectile dysfunction)   . Frequent urination   . Hypertension   . Peripheral vascular disease ([place])   . Pneumonia 04/2016  . Prostate cancer ([place])   . Sleep apnea    OSA--USE C-PAP  . Urinary stress incontinence, male    Assessment: Pharmacy consulted to dose and monitor warfarin in this 82 year old male who was taking warfarin prior to admission for atrial fibrillation. Patient presented for scheduled angiogram so warfarin had been held for several days prior to admission and INR subtherapeutic.   Home warfarin dose: 5 mg PO daily  Dosing history: Date INR Dose 4/11 1.21 5 mg 4/12 -- 5 mg 4/13 1.44 5 mg 4/14 1.72 5 mg 4/15 1.97     5 mg 4/16     2.09  Goal of Therapy:  INR 2-3 Monitor platelets by anticoagulation protocol: Yes   Plan:  INR = 2.09 is now therapeutic   Continue current home dose of warfarin 5  mg PO daily. Enoxaparin 1 mg/kg subQ BID bridge was discontinued by provider on 09/05/16. Will follow up on AM INR.  Paulina Fusi, PharmD, BCPS 09/06/2016 11:48 AM

## 2016-09-08 ENCOUNTER — Encounter: Payer: Self-pay | Admitting: Vascular Surgery

## 2016-09-20 ENCOUNTER — Ambulatory Visit (INDEPENDENT_AMBULATORY_CARE_PROVIDER_SITE_OTHER): Payer: Medicare Other | Admitting: Vascular Surgery

## 2016-09-20 ENCOUNTER — Encounter (INDEPENDENT_AMBULATORY_CARE_PROVIDER_SITE_OTHER): Payer: Self-pay | Admitting: Vascular Surgery

## 2016-09-20 VITALS — BP 135/78 | HR 72 | Resp 16 | Ht 66.0 in | Wt 195.0 lb

## 2016-09-20 DIAGNOSIS — I739 Peripheral vascular disease, unspecified: Secondary | ICD-10-CM

## 2016-09-20 DIAGNOSIS — M79605 Pain in left leg: Secondary | ICD-10-CM | POA: Diagnosis not present

## 2016-09-20 DIAGNOSIS — Z794 Long term (current) use of insulin: Secondary | ICD-10-CM | POA: Diagnosis not present

## 2016-09-20 DIAGNOSIS — E119 Type 2 diabetes mellitus without complications: Secondary | ICD-10-CM

## 2016-09-20 NOTE — Progress Notes (Signed)
Subjective:    Patient ID: [name], male    DOB: [birth year], 82 y.o.   MRN: [id number] Chief Complaint  Patient presents with  . Re-evaluation    2 week post op   Patient presents for his first post-procedure follow up. He is s/p a left lower extremity angiogram which was notable for a thrombosed SFA stent. Post-intervention with two vessel run off (thrombosed ATA). The patient is seen with wife. Patient is currently at rehab, had a prolonged hospital stay due to A-Fib issues. Patient is still complaining of left lateral foot pain. Wife states improvement in patients left foot wounds. No fever, nausea or vomiting.    Review of Systems  Constitutional: Negative.   HENT: Negative.   Eyes: Negative.   Respiratory: Negative.   Cardiovascular:       Left foot pain  Gastrointestinal: Negative.   Endocrine: Negative.   Genitourinary: Negative.   Musculoskeletal: Negative.   Skin: Positive for wound.  Allergic/Immunologic: Negative.   Neurological: Negative.   Hematological: Negative.   Psychiatric/Behavioral: Negative.       Objective:   Physical Exam  Constitutional: He is oriented to person, place, and time. He appears well-developed and well-nourished. No distress.  HENT:  Head: Normocephalic and atraumatic.  Eyes: Conjunctivae are normal. Pupils are equal, round, and reactive to light.  Neck: Normal range of motion.  Cardiovascular: Normal rate, regular rhythm, normal heart sounds and intact distal pulses.   Pulses:      Radial pulses are 2+ on the right side, and 2+ on the left side.       Dorsalis pedis pulses are 1+ on the right side, and 2+ on the left side.       Posterior tibial pulses are 1+ on the right side, and 0 on the left side.  Dopplerable PT pulse noted  Pulmonary/Chest: Effort normal.  Musculoskeletal: He exhibits no edema.  Neurological: He is alert and oriented to person, place, and time.  Skin: He is not diaphoretic.  Wounds of left foot are  drier than during last visit. Some healing is noted.   Psychiatric: He has a normal mood and affect. His behavior is normal. Judgment and thought content normal.  Vitals reviewed.  BP 135/78 (BP Location: Right Arm)   Pulse 72   Resp 16   Ht 5\' 6"  (1.676 m)   Wt 195 lb (88.5 kg)   BMI 31.47 kg/m   Past Medical History:  Diagnosis Date  . Arthritis   . Atrial fibrillation ([place])   . Carcinoma of prostate ([place])   . CHF (congestive heart failure) ([place])   . Diabetes mellitus without complication ([place])   . ED (erectile dysfunction)   . Frequent urination   . Hypertension   . Peripheral vascular disease ([place])   . Pneumonia 04/2016  . Prostate cancer ([place])   . Sleep apnea    OSA--USE C-PAP  . Urinary stress incontinence, male    Social History   Social History  . Marital status: Married    Spouse name: N/A  . Number of children: N/A  . Years of education: N/A   Occupational History  . retired    Social History Main Topics  . Smoking status: Former Smoker    Packs/day: 0.25    Types: Cigarettes    Quit date: 10/14/1994  . Smokeless tobacco: Never Used  . Alcohol use No  . Drug use: No  . Sexual activity: Not on file  Other Topics Concern  . Not on file   Social History Narrative  . No narrative on file   Past Surgical History:  Procedure Laterality Date  . CHOLECYSTECTOMY    . EYE SURGERY Bilateral    Cataract Extraction with IOL  . LOWER EXTREMITY ANGIOGRAPHY Left 08/16/2016   Procedure: Lower Extremity Angiography;  Surgeon: Algernon Huxley, MD;  Location: [facility];  Service: Cardiovascular;  Laterality: Left;  . LOWER EXTREMITY ANGIOGRAPHY Left 09/01/2016   Procedure: Lower Extremity Angiography;  Surgeon: Algernon Huxley, MD;  Location: [facility];  Service: Cardiovascular;  Laterality: Left;  . LOWER EXTREMITY INTERVENTION  09/01/2016   Procedure: Lower Extremity Intervention;  Surgeon: Algernon Huxley, MD;  Location: [facility];   Service: Cardiovascular;;  . PROSTATE SURGERY     removal  . TONSILLECTOMY     as a child   Family History  Problem Relation Age of Onset  . Hypertension Mother   . Prostate cancer Neg Hx   . Bladder Cancer Neg Hx   . Kidney disease Neg Hx    Allergies  Allergen Reactions  . Contrast Media [Iodinated Diagnostic Agents]     SOB  . Iohexol      Desc: Respiratory Distress, Laryngedema, diaphoresis, Onset Date: [id number]   . Latex Rash      Assessment & Plan:  Patient presents for his first post-procedure follow up. He is s/p a left lower extremity angiogram which was notable for a thrombosed SFA stent. Post-intervention with two vessel run off (thrombosed ATA). The patient is seen with wife. Patient is currently at rehab, had a prolonged hospital stay due to A-Fib issues. Patient is still complaining of left lateral foot pain. Wife states improvement in patients left foot wounds. No fever, nausea or vomiting.  1. PAD (peripheral artery disease) ([place]) - Improved Physical exam is improved. Foot is warm with dopperable PT pulse. Wound with some healing. Patient will be following up with podiatry for wound care Patient to follow up with [place] in one month with ABI  - VAS [place] ABI WITH/WO TBI; Future  2. Type 2 diabetes mellitus treated with insulin (HCC) - Stable Encouraged good control as its slows the progression of atherosclerotic disease  3. Leg pain, left - Stable Patient already on oxycodone every four hours PRN Will start Neurontin 300mg  daily as he may be experiencing neuropathic pain due chronic left lower extremity ischemia Will increase in one month if tolerating  Current Outpatient Prescriptions on File Prior to Visit  Medication Sig Dispense Refill  . aspirin EC 81 MG tablet Take 1 tablet (81 mg total) by mouth daily. 150 tablet 2  . atorvastatin (LIPITOR) 80 MG tablet Take 80 mg by mouth daily at 6 PM.     . diltiazem (CARDIZEM CD) 240 MG 24 hr capsule Take 1 capsule  (240 mg total) by mouth 2 (two) times daily. 60 capsule 3  . diphenhydrAMINE (BENADRYL) 25 mg capsule Take 25 mg by mouth. Sunday night before procedure, and the day of procedure before coming to hospital.    . docusate sodium (COLACE) 100 MG capsule Take 1 capsule (100 mg total) by mouth 2 (two) times daily. 60 capsule 3  . furosemide (LASIX) 20 MG tablet TAKE 1 TABLET DAILY    . insulin glargine (LANTUS) 100 UNIT/ML injection Inject 0.24 mLs (24 Units total) into the skin at bedtime. 10 mL 11  . metFORMIN (GLUCOPHAGE) 1000 MG tablet TAKE  1 TABLET TWICE A DAY WITH MEALS    . metoprolol (LOPRESSOR) 100 MG tablet Take 1 tablet (100 mg total) by mouth 2 (two) times daily. 60 tablet 3  . oxyCODONE-acetaminophen (PERCOCET/ROXICET) 5-325 MG tablet Take 1 tablet by mouth every 4 (four) hours as needed for moderate pain or severe pain. 30 tablet 0  . polyethylene glycol (MIRALAX / GLYCOLAX) packet Take 17 g by mouth daily as needed for mild constipation. 14 each 0  . warfarin (COUMADIN) 5 MG tablet Take 5 mg by mouth daily.     No current facility-administered medications on file prior to visit.    There are no Patient Instructions on file for this visit. No Follow-up on file.  Obdulio Mash A Tausha Milhoan, PA-C

## 2016-10-12 ENCOUNTER — Ambulatory Visit (INDEPENDENT_AMBULATORY_CARE_PROVIDER_SITE_OTHER): Payer: Medicare Other | Admitting: Vascular Surgery

## 2016-10-12 ENCOUNTER — Encounter (INDEPENDENT_AMBULATORY_CARE_PROVIDER_SITE_OTHER): Payer: Self-pay | Admitting: Vascular Surgery

## 2016-10-12 ENCOUNTER — Encounter
Admission: RE | Admit: 2016-10-12 | Discharge: 2016-10-12 | Disposition: A | Discharge status: Home / Self Care | Payer: Medicare Other | Source: Ambulatory Visit | Attending: Podiatry | Admitting: Podiatry

## 2016-10-12 VITALS — BP 87/59 | HR 79 | Resp 16

## 2016-10-12 DIAGNOSIS — Z91041 Radiographic dye allergy status: Secondary | ICD-10-CM | POA: Diagnosis not present

## 2016-10-12 DIAGNOSIS — Z888 Allergy status to other drugs, medicaments and biological substances status: Secondary | ICD-10-CM | POA: Diagnosis not present

## 2016-10-12 DIAGNOSIS — I4891 Unspecified atrial fibrillation: Secondary | ICD-10-CM | POA: Diagnosis not present

## 2016-10-12 DIAGNOSIS — E785 Hyperlipidemia, unspecified: Secondary | ICD-10-CM | POA: Diagnosis not present

## 2016-10-12 DIAGNOSIS — E11621 Type 2 diabetes mellitus with foot ulcer: Secondary | ICD-10-CM | POA: Diagnosis not present

## 2016-10-12 DIAGNOSIS — Z9079 Acquired absence of other genital organ(s): Secondary | ICD-10-CM | POA: Diagnosis not present

## 2016-10-12 DIAGNOSIS — N529 Male erectile dysfunction, unspecified: Secondary | ICD-10-CM | POA: Diagnosis not present

## 2016-10-12 DIAGNOSIS — Z794 Long term (current) use of insulin: Secondary | ICD-10-CM | POA: Diagnosis not present

## 2016-10-12 DIAGNOSIS — I509 Heart failure, unspecified: Secondary | ICD-10-CM | POA: Diagnosis not present

## 2016-10-12 DIAGNOSIS — N393 Stress incontinence (female) (male): Secondary | ICD-10-CM | POA: Diagnosis not present

## 2016-10-12 DIAGNOSIS — Z7902 Long term (current) use of antithrombotics/antiplatelets: Secondary | ICD-10-CM | POA: Diagnosis not present

## 2016-10-12 DIAGNOSIS — I7025 Atherosclerosis of native arteries of other extremities with ulceration: Secondary | ICD-10-CM | POA: Diagnosis not present

## 2016-10-12 DIAGNOSIS — I10 Essential (primary) hypertension: Secondary | ICD-10-CM

## 2016-10-12 DIAGNOSIS — L97528 Non-pressure chronic ulcer of other part of left foot with other specified severity: Secondary | ICD-10-CM | POA: Diagnosis not present

## 2016-10-12 DIAGNOSIS — E119 Type 2 diabetes mellitus without complications: Secondary | ICD-10-CM | POA: Diagnosis not present

## 2016-10-12 DIAGNOSIS — I96 Gangrene, not elsewhere classified: Secondary | ICD-10-CM | POA: Diagnosis not present

## 2016-10-12 DIAGNOSIS — I110 Hypertensive heart disease with heart failure: Secondary | ICD-10-CM | POA: Diagnosis not present

## 2016-10-12 DIAGNOSIS — Z91040 Latex allergy status: Secondary | ICD-10-CM | POA: Diagnosis not present

## 2016-10-12 DIAGNOSIS — Z8546 Personal history of malignant neoplasm of prostate: Secondary | ICD-10-CM | POA: Diagnosis not present

## 2016-10-12 DIAGNOSIS — Z87891 Personal history of nicotine dependence: Secondary | ICD-10-CM | POA: Diagnosis not present

## 2016-10-12 HISTORY — DX: Nonrheumatic mitral (valve) insufficiency: I34.0

## 2016-10-12 HISTORY — DX: Hyperlipidemia, unspecified: E78.5

## 2016-10-12 HISTORY — DX: Type 2 diabetes mellitus with foot ulcer: E11.621

## 2016-10-12 HISTORY — DX: Chronic kidney disease, unspecified: N18.9

## 2016-10-12 HISTORY — DX: Type 2 diabetes mellitus with foot ulcer: L97.529

## 2016-10-12 LAB — BASIC METABOLIC PANEL
ANION GAP: 9 (ref 5–15)
BUN: 30 mg/dL — AB (ref 6–20)
CO2: 26 mmol/L (ref 22–32)
Calcium: 9.2 mg/dL (ref 8.9–10.3)
Chloride: 104 mmol/L (ref 101–111)
Creatinine, Ser: 1.1 mg/dL (ref 0.61–1.24)
GFR calc Af Amer: >60 mL/min (ref >60)
Glucose, Bld: 118 mg/dL — ABNORMAL HIGH (ref 65–99)
POTASSIUM: 4.4 mmol/L (ref 3.5–5.1)
SODIUM: 139 mmol/L (ref 135–145)

## 2016-10-12 NOTE — Assessment & Plan Note (Signed)
On anticoagulation. Rapid A. fib lead to a prolonged hospitalization last time.

## 2016-10-12 NOTE — Assessment & Plan Note (Signed)
This is clearly a worrisome and limb threatening situation. I think he is going to lose at least toes 3 through 5 on the left foot, and I think a transmetatarsal amputation may also be necessary. I have discussed that a below-knee amputation is certainly a possibility, and we are doing everything we can to prevent this. I have recommended an angiogram after discussions with Dr. Elvina Mattes. I think that the most thorough assessment of his blood flow prior to any debridement or amputation would be recommended, and this is tentatively scheduled for Thursday for his angiogram and Friday with Dr. Elvina Mattes for debridement. The patient voices his understanding and is agreeable to proceed.

## 2016-10-12 NOTE — Pre-Procedure Instructions (Signed)
Met B sent to Dr. Elvina Mattes and Anesthesia for review.

## 2016-10-12 NOTE — Progress Notes (Signed)
MRN : [id number]  [name] is a 82 y.o. (October 29, 1934) male who presents with chief complaint of  Chief Complaint  Patient presents with  . Follow-up  .  History of Present Illness: Patient returns today in follow up of PAD with ulceration of the LLE. He has undergone 2 previous angiograms and has continued ulceration on multiple toes on the left foot. He has some black eschar and some dry gangrenous changes on toes 3, 4, and 5 at this point. His pain is essentially unrelenting. His foot is reasonably warm to the mid foot but pale in the forefoot with the changes on the toes as described above. He continues on full anticoagulation. At his last procedure, he had problems with rapid atrial fibrillation requiring extended hospitalization. He currently has no palpitations or chest pain since discharge. He reports no fever or chills.       Past Medical History:  Diagnosis Date  . Carcinoma of prostate ([place])   . CHF (congestive heart failure) ([place])   . Diabetes mellitus without complication ([place])   . ED (erectile dysfunction)   . Elevated blood pressure   . Frequent urination   . Hypertension   . Prostate cancer ([place])   . Urinary stress incontinence, male          Past Surgical History:  Procedure Laterality Date  . PROSTATE SURGERY     removal         Family History  Problem Relation Age of Onset  . Prostate cancer Neg Hx   . Bladder Cancer Neg Hx   . Kidney disease Neg Hx   No bleeding disorders, clotting disorders, autoimmune diseases, or aneurysms  Social History      Social History  Substance Use Topics  . Smoking status: Former Smoker    Quit date: 10/14/1994  . Smokeless tobacco: Never Used  . Alcohol use No  Married, wife accompanies today       Allergies  Allergen Reactions  . Contrast Media [Iodinated Diagnostic Agents]     SOB  . Iohexol      Desc: Respiratory Distress, Laryngedema, diaphoresis, Onset Date:  [id number]   . Latex Rash          Current Outpatient Prescriptions  Medication Sig Dispense Refill  . atorvastatin (LIPITOR) 80 MG tablet Take 80 mg by mouth daily.    . Blood Glucose Monitoring Suppl (FIFTY50 GLUCOSE METER 2.0) w/Device KIT Use as directed.    . Blood Glucose Monitoring Suppl (ONE TOUCH ULTRA 2) W/DEVICE KIT Use as directed.    . canagliflozin (INVOKANA) 300 MG TABS tablet Take 300 mg by mouth daily before breakfast.    . clobetasol ointment (TEMOVATE) 0.05 %     . diltiazem (CARDIZEM CD) 300 MG 24 hr capsule Take 1 capsule (300 mg total) by mouth daily. 30 capsule 1  . furosemide (LASIX) 20 MG tablet TAKE 1 TABLET DAILY    . glucose blood (ONE TOUCH ULTRA TEST) test strip Use 2 (two) times daily. Use as instructed.    Marland Kitchen HUMALOG KWIKPEN 100 UNIT/ML KiwkPen Inject 5 Units into the skin every evening.    . Lancets 30G MISC Use 1 Device 2 (two) times daily.    Marland Kitchen levofloxacin (LEVAQUIN) 500 MG tablet Take 1 tablet (500 mg total) by mouth daily. 3 tablet 0  . metFORMIN (GLUCOPHAGE) 1000 MG tablet TAKE 1 TABLET TWICE A DAY WITH MEALS    . metoprolol succinate (TOPROL-XL) 100 MG 24  hr tablet Take 100 mg by mouth daily. Take with or immediately following a meal.    . pioglitazone (ACTOS) 30 MG tablet Take 30 mg by mouth daily.    . TRULICITY 1.5 KK/9.3GH SOPN     . valsartan (DIOVAN) 320 MG tablet TAKE 1 TABLET DAILY    . warfarin (COUMADIN) 5 MG tablet Take 5 mg by mouth daily.     No current facility-administered medications for this visit.       REVIEW OF SYSTEMS (Negative unless checked)  Constitutional: []Weight loss  []Fever  []Chills Cardiac: []Chest pain   []Chest pressure   [x]Palpitations   []Shortness of breath when laying flat   []Shortness of breath at rest   [x]Shortness of breath with exertion. Vascular:  [x]Pain in legs with walking   []Pain in legs at rest   []Pain in legs when laying flat   []Claudication   [x]Pain  in feet when walking  [x]Pain in feet at rest  []Pain in feet when laying flat   []History of DVT   []Phlebitis   [x]Swelling in legs   []Varicose veins   [x]Non-healing ulcers Pulmonary:   []Uses home oxygen   [x]Productive cough   []Hemoptysis   []Wheeze  []COPD   []Asthma Neurologic:  []Dizziness  []Blackouts   []Seizures   []History of stroke   []History of TIA  []Aphasia   []Temporary blindness   []Dysphagia   []Weakness or numbness in arms   []Weakness or numbness in legs Musculoskeletal:  [x]Arthritis   []Joint swelling   []Joint pain   []Low back pain Hematologic:  []Easy bruising  []Easy bleeding   []Hypercoagulable state   []Anemic  []Hepatitis Gastrointestinal:  []Blood in stool   []Vomiting blood  []Gastroesophageal reflux/heartburn   []Abdominal pain Genitourinary:  []Chronic kidney disease   []Difficult urination  []Frequent urination  []Burning with urination   []Hematuria Skin:  []Rashes   [x]Ulcers   [x]Wounds Psychological:  []History of anxiety   [] History of major depression.   Physical Examination  BP (!) 87/59   Pulse 79   Resp 16  Gen:  WD/WN, NAD Head: [place]/AT, No temporalis wasting. Ear/Nose/Throat: Hearing grossly intact, nares w/o erythema or drainage, trachea midline Eyes: Conjunctiva clear. Sclera non-icteric Neck: Supple.  No JVD.  Pulmonary:  Good air movement, no use of accessory muscles.  Cardiac: RRR, normal S1, S2 Vascular:  Vessel Right Left  Radial Palpable Palpable  Ulnar Palpable Palpable  Brachial Palpable Palpable  Carotid Palpable, without bruit Palpable, without bruit  Aorta Not palpable N/A  Femoral Palpable Palpable  Popliteal Palpable 1+ Palpable  PT 1+ Palpable 1+ Palpable  DP 1+ Palpable Not Palpable   Gastrointestinal: soft, non-tender/non-distended.  Musculoskeletal: M/S 5/5 throughout.  No deformity or atrophy. Left foot ulcerations and dry gangrenous changes as described above. Neurologic: Sensation grossly intact in extremities.   Symmetrical.  Speech is fluent.  Psychiatric: Judgment intact, Mood & affect appropriate for pt's clinical situation. Dermatologic: Open wounds with eschar on multiple toes on the left foot. Left forefoot has sluggish capillary refill.      Labs Recent Results (from the past 2160 hour(s))  BUN     Status: Abnormal   Collection Time: 08/13/16  8:50 AM  Result Value Ref Range   BUN 29 (H) 6 - 20 mg/dL  Protime-INR     Status: Abnormal   Collection Time: 08/13/16  8:50 AM  Result Value Ref Range   Prothrombin Time 24.5 (H)  11.4 - 15.2 seconds   INR 6.38   Basic metabolic panel     Status: Abnormal   Collection Time: 08/16/16 11:23 AM  Result Value Ref Range   Sodium 140 135 - 145 mmol/L   Potassium 4.1 3.5 - 5.1 mmol/L   Chloride 106 101 - 111 mmol/L   CO2 23 22 - 32 mmol/L   Glucose, Bld 323 (H) 65 - 99 mg/dL   BUN 28 (H) 6 - 20 mg/dL   Creatinine, Ser 1.32 (H) 0.61 - 1.24 mg/dL   Calcium 9.3 8.9 - 10.3 mg/dL   GFR calc non Af Amer 50 (L) >60 mL/min   GFR calc Af Amer 58 (L) >60 mL/min    Comment: (NOTE) The eGFR has been calculated using the CKD EPI equation. This calculation has not been validated in all clinical situations. eGFR's persistently <60 mL/min signify possible Chronic Kidney Disease.    Anion gap 11 5 - 15  Protime-INR     Status: Abnormal   Collection Time: 08/16/16 11:23 AM  Result Value Ref Range   Prothrombin Time 17.3 (H) 11.4 - 15.2 seconds   INR 1.40   BUN     Status: Abnormal   Collection Time: 08/27/16 12:00 PM  Result Value Ref Range   BUN 23 (H) 6 - 20 mg/dL  Creatinine, serum     Status: Abnormal   Collection Time: 08/27/16 12:00 PM  Result Value Ref Range   Creatinine, Ser 1.22 0.61 - 1.24 mg/dL   GFR calc non Af Amer 55 (L) >60 mL/min   GFR calc Af Amer >60 >60 mL/min    Comment: (NOTE) The eGFR has been calculated using the CKD EPI equation. This calculation has not been validated in all clinical situations. eGFR's persistently <60  mL/min signify possible Chronic Kidney Disease.   Glucose, capillary     Status: Abnormal   Collection Time: 08/30/16 12:16 PM  Result Value Ref Range   Glucose-Capillary 252 (H) 65 - 99 mg/dL  Basic metabolic panel     Status: Abnormal   Collection Time: 08/30/16  3:39 PM  Result Value Ref Range   Sodium 136 135 - 145 mmol/L   Potassium 4.3 3.5 - 5.1 mmol/L   Chloride 103 101 - 111 mmol/L   CO2 22 22 - 32 mmol/L   Glucose, Bld 372 (H) 65 - 99 mg/dL   BUN 31 (H) 6 - 20 mg/dL   Creatinine, Ser 1.43 (H) 0.61 - 1.24 mg/dL   Calcium 8.8 (L) 8.9 - 10.3 mg/dL   GFR calc non Af Amer 45 (L) >60 mL/min   GFR calc Af Amer 52 (L) >60 mL/min    Comment: (NOTE) The eGFR has been calculated using the CKD EPI equation. This calculation has not been validated in all clinical situations. eGFR's persistently <60 mL/min signify possible Chronic Kidney Disease.    Anion gap 11 5 - 15  CBC     Status: Abnormal   Collection Time: 08/30/16  3:39 PM  Result Value Ref Range   WBC 12.9 (H) 3.8 - 10.6 K/uL   RBC 5.32 4.40 - 5.90 MIL/uL   Hemoglobin 15.6 13.0 - 18.0 g/dL   HCT 47.4 40.0 - 52.0 %   MCV 88.9 80.0 - 100.0 fL   MCH 29.3 26.0 - 34.0 pg   MCHC 32.9 32.0 - 36.0 g/dL   RDW 14.9 (H) 11.5 - 14.5 %   Platelets 224 150 - 440 K/uL  Glucose, capillary  Status: Abnormal   Collection Time: 08/30/16  4:52 PM  Result Value Ref Range   Glucose-Capillary 491 (H) 65 - 99 mg/dL  Glucose, capillary     Status: Abnormal   Collection Time: 08/30/16  9:32 PM  Result Value Ref Range   Glucose-Capillary 251 (H) 65 - 99 mg/dL   Comment 1 Notify RN    Comment 2 Document in Chart   Basic metabolic panel     Status: Abnormal   Collection Time: 08/31/16  3:58 AM  Result Value Ref Range   Sodium 135 135 - 145 mmol/L   Potassium 3.9 3.5 - 5.1 mmol/L   Chloride 103 101 - 111 mmol/L   CO2 21 (L) 22 - 32 mmol/L   Glucose, Bld 313 (H) 65 - 99 mg/dL   BUN 35 (H) 6 - 20 mg/dL   Creatinine, Ser 1.28 (H) 0.61  - 1.24 mg/dL   Calcium 8.8 (L) 8.9 - 10.3 mg/dL   GFR calc non Af Amer 52 (L) >60 mL/min   GFR calc Af Amer 60 (L) >60 mL/min    Comment: (NOTE) The eGFR has been calculated using the CKD EPI equation. This calculation has not been validated in all clinical situations. eGFR's persistently <60 mL/min signify possible Chronic Kidney Disease.    Anion gap 11 5 - 15  CBC     Status: Abnormal   Collection Time: 08/31/16  3:58 AM  Result Value Ref Range   WBC 17.2 (H) 3.8 - 10.6 K/uL   RBC 5.29 4.40 - 5.90 MIL/uL   Hemoglobin 15.4 13.0 - 18.0 g/dL   HCT 46.6 40.0 - 52.0 %   MCV 88.1 80.0 - 100.0 fL   MCH 29.1 26.0 - 34.0 pg   MCHC 33.0 32.0 - 36.0 g/dL   RDW 14.9 (H) 11.5 - 14.5 %   Platelets 229 150 - 440 K/uL  Glucose, capillary     Status: Abnormal   Collection Time: 08/31/16  7:26 AM  Result Value Ref Range   Glucose-Capillary 282 (H) 65 - 99 mg/dL  Glucose, capillary     Status: Abnormal   Collection Time: 08/31/16 11:53 AM  Result Value Ref Range   Glucose-Capillary 231 (H) 65 - 99 mg/dL   Comment 1 Notify RN    Comment 2 Document in Chart   Glucose, capillary     Status: Abnormal   Collection Time: 08/31/16  4:58 PM  Result Value Ref Range   Glucose-Capillary 214 (H) 65 - 99 mg/dL   Comment 1 Notify RN    Comment 2 Document in Chart   Glucose, capillary     Status: Abnormal   Collection Time: 08/31/16  8:55 PM  Result Value Ref Range   Glucose-Capillary 174 (H) 65 - 99 mg/dL  CBC     Status: Abnormal   Collection Time: 09/01/16  4:02 AM  Result Value Ref Range   WBC 14.6 (H) 3.8 - 10.6 K/uL   RBC 5.12 4.40 - 5.90 MIL/uL   Hemoglobin 14.8 13.0 - 18.0 g/dL   HCT 44.7 40.0 - 52.0 %   MCV 87.4 80.0 - 100.0 fL   MCH 29.0 26.0 - 34.0 pg   MCHC 33.2 32.0 - 36.0 g/dL   RDW 15.1 (H) 11.5 - 14.5 %   Platelets 192 150 - 440 K/uL  Basic metabolic panel     Status: Abnormal   Collection Time: 09/01/16  4:02 AM  Result Value Ref Range   Sodium 138 135 -  145 mmol/L    Potassium 3.7 3.5 - 5.1 mmol/L   Chloride 106 101 - 111 mmol/L   CO2 25 22 - 32 mmol/L   Glucose, Bld 177 (H) 65 - 99 mg/dL   BUN 34 (H) 6 - 20 mg/dL   Creatinine, Ser 0.91 0.61 - 1.24 mg/dL   Calcium 8.3 (L) 8.9 - 10.3 mg/dL   GFR calc non Af Amer >60 >60 mL/min   GFR calc Af Amer >60 >60 mL/min    Comment: (NOTE) The eGFR has been calculated using the CKD EPI equation. This calculation has not been validated in all clinical situations. eGFR's persistently <60 mL/min signify possible Chronic Kidney Disease.    Anion gap 7 5 - 15  Protime-INR     Status: Abnormal   Collection Time: 09/01/16  4:02 AM  Result Value Ref Range   Prothrombin Time 15.4 (H) 11.4 - 15.2 seconds   INR 1.21   Magnesium     Status: None   Collection Time: 09/01/16  4:02 AM  Result Value Ref Range   Magnesium 2.0 1.7 - 2.4 mg/dL  Glucose, capillary     Status: Abnormal   Collection Time: 09/01/16  7:39 AM  Result Value Ref Range   Glucose-Capillary 154 (H) 65 - 99 mg/dL   Comment 1 Notify RN   Glucose, capillary     Status: Abnormal   Collection Time: 09/01/16  1:44 PM  Result Value Ref Range   Glucose-Capillary 218 (H) 65 - 99 mg/dL  Glucose, capillary     Status: Abnormal   Collection Time: 09/01/16  5:38 PM  Result Value Ref Range   Glucose-Capillary 240 (H) 65 - 99 mg/dL   Comment 1 Notify RN    Comment 2 Document in Chart   Glucose, capillary     Status: Abnormal   Collection Time: 09/01/16  8:29 PM  Result Value Ref Range   Glucose-Capillary 221 (H) 65 - 99 mg/dL   Comment 1 Notify RN    Comment 2 Document in Chart   Glucose, capillary     Status: Abnormal   Collection Time: 09/02/16  7:33 AM  Result Value Ref Range   Glucose-Capillary 174 (H) 65 - 99 mg/dL   Comment 1 Notify RN   Glucose, capillary     Status: Abnormal   Collection Time: 09/02/16 11:36 AM  Result Value Ref Range   Glucose-Capillary 277 (H) 65 - 99 mg/dL   Comment 1 Notify RN   Glucose, capillary     Status: Abnormal    Collection Time: 09/02/16  4:52 PM  Result Value Ref Range   Glucose-Capillary 117 (H) 65 - 99 mg/dL   Comment 1 Notify RN   Glucose, capillary     Status: Abnormal   Collection Time: 09/02/16  9:29 PM  Result Value Ref Range   Glucose-Capillary 110 (H) 65 - 99 mg/dL  Protime-INR     Status: Abnormal   Collection Time: 09/03/16  7:13 AM  Result Value Ref Range   Prothrombin Time 17.7 (H) 11.4 - 15.2 seconds   INR 1.44   Glucose, capillary     Status: Abnormal   Collection Time: 09/03/16  7:42 AM  Result Value Ref Range   Glucose-Capillary 142 (H) 65 - 99 mg/dL   Comment 1 Notify RN   Glucose, capillary     Status: Abnormal   Collection Time: 09/03/16 11:46 AM  Result Value Ref Range   Glucose-Capillary 176 (H) 65 - 99  mg/dL   Comment 1 Notify RN   Glucose, capillary     Status: Abnormal   Collection Time: 09/03/16  4:39 PM  Result Value Ref Range   Glucose-Capillary 191 (H) 65 - 99 mg/dL   Comment 1 Notify RN   Glucose, capillary     Status: Abnormal   Collection Time: 09/03/16  9:23 PM  Result Value Ref Range   Glucose-Capillary 209 (H) 65 - 99 mg/dL   Comment 1 Notify RN    Comment 2 Document in Chart   Protime-INR     Status: Abnormal   Collection Time: 09/04/16  4:42 AM  Result Value Ref Range   Prothrombin Time 20.4 (H) 11.4 - 15.2 seconds   INR 1.72   CBC     Status: Abnormal   Collection Time: 09/04/16  4:42 AM  Result Value Ref Range   WBC 11.8 (H) 3.8 - 10.6 K/uL   RBC 5.07 4.40 - 5.90 MIL/uL   Hemoglobin 15.0 13.0 - 18.0 g/dL   HCT 44.1 40.0 - 52.0 %   MCV 87.0 80.0 - 100.0 fL   MCH 29.6 26.0 - 34.0 pg   MCHC 34.1 32.0 - 36.0 g/dL   RDW 14.6 (H) 11.5 - 14.5 %   Platelets 175 150 - 440 K/uL  Creatinine, serum     Status: None   Collection Time: 09/04/16  4:42 AM  Result Value Ref Range   Creatinine, Ser 0.72 0.61 - 1.24 mg/dL   GFR calc non Af Amer >60 >60 mL/min   GFR calc Af Amer >60 >60 mL/min    Comment: (NOTE) The eGFR has been calculated  using the CKD EPI equation. This calculation has not been validated in all clinical situations. eGFR's persistently <60 mL/min signify possible Chronic Kidney Disease.   Glucose, capillary     Status: Abnormal   Collection Time: 09/04/16  8:05 AM  Result Value Ref Range   Glucose-Capillary 159 (H) 65 - 99 mg/dL  Glucose, capillary     Status: Abnormal   Collection Time: 09/04/16 12:14 PM  Result Value Ref Range   Glucose-Capillary 158 (H) 65 - 99 mg/dL  Glucose, capillary     Status: Abnormal   Collection Time: 09/04/16  4:44 PM  Result Value Ref Range   Glucose-Capillary 125 (H) 65 - 99 mg/dL  Glucose, capillary     Status: Abnormal   Collection Time: 09/04/16 10:34 PM  Result Value Ref Range   Glucose-Capillary 137 (H) 65 - 99 mg/dL   Comment 1 Notify RN   Protime-INR     Status: Abnormal   Collection Time: 09/05/16  7:32 AM  Result Value Ref Range   Prothrombin Time 22.7 (H) 11.4 - 15.2 seconds   INR 1.97   Glucose, capillary     Status: Abnormal   Collection Time: 09/05/16  7:37 AM  Result Value Ref Range   Glucose-Capillary 144 (H) 65 - 99 mg/dL  Glucose, capillary     Status: Abnormal   Collection Time: 09/05/16 11:44 AM  Result Value Ref Range   Glucose-Capillary 211 (H) 65 - 99 mg/dL  Glucose, capillary     Status: Abnormal   Collection Time: 09/05/16  4:44 PM  Result Value Ref Range   Glucose-Capillary 111 (H) 65 - 99 mg/dL  Glucose, capillary     Status: Abnormal   Collection Time: 09/05/16  8:27 PM  Result Value Ref Range   Glucose-Capillary 113 (H) 65 - 99 mg/dL   Comment  1 Notify RN    Comment 2 Document in Chart   Protime-INR     Status: Abnormal   Collection Time: 09/06/16  4:30 AM  Result Value Ref Range   Prothrombin Time 23.8 (H) 11.4 - 15.2 seconds   INR 2.09   Glucose, capillary     Status: Abnormal   Collection Time: 09/06/16  7:30 AM  Result Value Ref Range   Glucose-Capillary 113 (H) 65 - 99 mg/dL  Glucose, capillary     Status: Abnormal    Collection Time: 09/06/16 11:36 AM  Result Value Ref Range   Glucose-Capillary 154 (H) 65 - 99 mg/dL  Basic metabolic panel     Status: Abnormal   Collection Time: 10/12/16  3:32 PM  Result Value Ref Range   Sodium 139 135 - 145 mmol/L   Potassium 4.4 3.5 - 5.1 mmol/L   Chloride 104 101 - 111 mmol/L   CO2 26 22 - 32 mmol/L   Glucose, Bld 118 (H) 65 - 99 mg/dL   BUN 30 (H) 6 - 20 mg/dL   Creatinine, Ser 1.10 0.61 - 1.24 mg/dL   Calcium 9.2 8.9 - 10.3 mg/dL   GFR calc non Af Amer >60 >60 mL/min   GFR calc Af Amer >60 >60 mL/min    Comment: (NOTE) The eGFR has been calculated using the CKD EPI equation. This calculation has not been validated in all clinical situations. eGFR's persistently <60 mL/min signify possible Chronic Kidney Disease.    Anion gap 9 5 - 15    Radiology No results found.    Assessment/Plan Hyperlipidemia lipid control important in reducing the progression of atherosclerotic disease. Continue statin therapy   Type 2 diabetes mellitus treated with insulin (HCC) blood glucose control important in reducing the progression of atherosclerotic disease. Also, involved in wound healing. On appropriate medications.   Essential hypertension blood pressure control important in reducing the progression of atherosclerotic disease. On appropriate oral medications.  A-fib (HCC) On anticoagulation. Rapid A. fib lead to a prolonged hospitalization last time.  Atherosclerosis of native arteries of the extremities with ulceration ([place]) This is clearly a worrisome and limb threatening situation. I think he is going to lose at least toes 3 through 5 on the left foot, and I think a transmetatarsal amputation may also be necessary. I have discussed that a below-knee amputation is certainly a possibility, and we are doing everything we can to prevent this. I have recommended an angiogram after discussions with Dr. Elvina Mattes. I think that the most thorough assessment of his  blood flow prior to any debridement or amputation would be recommended, and this is tentatively scheduled for Thursday for his angiogram and Friday with Dr. Elvina Mattes for debridement. The patient voices his understanding and is agreeable to proceed.    Leotis Pain, MD  10/12/2016 5:18 PM    This note was created with Dragon medical transcription system.  Any errors from dictation are purely unintentional

## 2016-10-12 NOTE — Patient Instructions (Signed)
Angiogram  An angiogram, also called angiography, is a procedure used to look at the blood vessels. In this procedure, dye is injected through a long, thin tube (catheter) into an artery. X-rays are then taken. The X-rays will show if there is a blockage or problem in a blood vessel.  Tell a health care provider about:   Any allergies you have, including allergies to shellfish or contrast dye.   All medicines you are taking, including vitamins, herbs, eye drops, creams, and over-the-counter medicines.   Any problems you or family members have had with anesthetic medicines.   Any blood disorders you have.   Any surgeries you have had.   Any previous kidney problems or failure you have had.   Any medical conditions you have.   Possibility of pregnancy, if this applies.  What are the risks?  Generally, an angiogram is a safe procedure. However, as with any procedure, problems can occur. Possible problems include:   Injury to the blood vessels, including rupture or bleeding.   Infection or bruising at the catheter site.   Allergic reaction to the dye or contrast used.   Kidney damage from the dye or contrast used.   Blood clots that can lead to a stroke or heart attack.    What happens before the procedure?   Do not eat or drink after midnight on the night before the procedure, or as directed by your health care provider.   Ask your health care provider if you may drink enough water to take any needed medicines the morning of the procedure.  What happens during the procedure?   You may be given a medicine to help you relax (sedative) before and during the procedure. This medicine is given through an IV access tube that is inserted into one of your veins.   The area where the catheter will be inserted will be washed and shaved. This is usually done in the groin but may be done in the fold of your arm (near your elbow) or in the wrist.   A medicine will be given to numb the area where the catheter will  be inserted (local anesthetic).   The catheter will be inserted with a guide wire into an artery. The catheter is guided by using a type of X-ray (fluoroscopy) to the blood vessel being examined.   Dye is then injected into the catheter, and X-rays are taken. The dye helps to show where any narrowing or blockages are located.  What happens after the procedure?   If the procedure is done through the leg, you will be kept in bed lying flat for several hours. You will be instructed to not bend or cross your legs.   The insertion site will be checked frequently.   The pulse in your feet or wrist will be checked frequently.   Additional blood tests, X-rays, and electrocardiography may be done.   You may need to stay in the hospital overnight for observation.  This information is not intended to replace advice given to you by your health care provider. Make sure you discuss any questions you have with your health care provider.  Document Released: 02/17/2005 Document Revised: 10/22/2015 Document Reviewed: 10/11/2012  [employer] Interactive Patient Education  2017 [employer]

## 2016-10-12 NOTE — Patient Instructions (Addendum)
Your procedure is scheduled on: Oct 15, 2016 (Friday) Report to Same Day Surgery 2nd floor medical mall [facility] Entrance-take elevator on left to 2nd floor.  Check in with surgery information desk.) To find out your arrival time please call [phone] between 1PM - 3PM on Oct 14, 2016 (Thursday) Remember: Instructions that are not followed completely may result in serious medical risk, up to and including death, or upon the discretion of your surgeon and anesthesiologist your surgery may need to be rescheduled.    _x___ 1. Do not eat food or drink liquids after midnight. No gum chewing or hard candies                               __x__ 2. No Alcohol for 24 hours before or after surgery.   __x__3. No Smoking for 24 prior to surgery.   ____  4. Bring all medications with you on the day of surgery if instructed.    __x__ 5. Notify your doctor if there is any change in your medical condition     (cold, fever, infections).     Do not wear jewelry, make-up, hairpins, clips or nail polish.  Do not wear lotions, powders, or perfumes.  Do not shave 48 hours prior to surgery. Men may shave face and neck.  Do not bring valuables to the hospital.    [facility] is not responsible for any belongings or valuables.               Contacts, dentures or bridgework may not be worn into surgery.  Leave your suitcase in the car. After surgery it may be brought to your room.  For patients admitted to the hospital, discharge time is determined by your  treatment team                       Patients discharged the day of surgery will not be allowed to drive home.  You will need someone to drive you home and stay with you the night of your procedure.    Please read over the following fact sheets that you were given:   [facility] Preparing for Surgery and or MRSA Information   _x___ Take the following medications the morning of surgery with a sip of  water  1.Cardizem  2.Gabapentin  3.Metoprolol.  .  ____Fleets enema or Magnesium Citrate as directed.   _x___ Use CHG Soap or sage wipes as directed on instruction sheet   ____ Use inhalers on the day of surgery and bring to hospital day of surgery  __x_ Stop Metformin and Janumet 2 days prior to surgery. (STOP METFORMIN ON MAY 23 )   __X__ Take 1/2 of usual insulin dose the night before surgery and none on the morning     surgery. (TAKE ONE -HALF OF LANTUS INSULIN ON THURSDAY NIGHT, AND NO INSULIN THE MORNING OF SURGERY)  _x___ Follow recommendations from Cardiologist, Pulmonologist or PCP regarding          stopping Aspirin, Coumadin, Pllavix ,Eliquis, Effient, or Pradaxa, and Pletal. (CINDY AT DR Elvina Mattes  OFFICE STATED PER DR Elvina Mattes TO CONTINUE WARFARIN-- )  X____Stop Anti-inflammatories such as Advil, Aleve, Ibuprofen, Motrin, Naproxen, Naprosyn, Goodies powders or aspirin products. OK to take Tylenol    _x___ Stop supplements until after surgery.  But may continue Vitamin D, Vitamin B,       and multivitamin.  __X__ Bring C-Pap to the hospital.

## 2016-10-13 ENCOUNTER — Other Ambulatory Visit (INDEPENDENT_AMBULATORY_CARE_PROVIDER_SITE_OTHER): Payer: Self-pay | Admitting: Vascular Surgery

## 2016-10-13 NOTE — Pre-Procedure Instructions (Signed)
EKG WITH KNOWN AFIB

## 2016-10-14 ENCOUNTER — Encounter: Payer: Self-pay | Admitting: Certified Registered Nurse Anesthetist

## 2016-10-14 ENCOUNTER — Ambulatory Visit
Admission: RE | Admit: 2016-10-14 | Discharge: 2016-10-14 | Disposition: A | Discharge status: Home / Self Care | Payer: Medicare Other | Source: Ambulatory Visit | Attending: Vascular Surgery | Admitting: Vascular Surgery

## 2016-10-14 ENCOUNTER — Encounter
Admission: RE | Disposition: A | Discharge status: Home / Self Care | Payer: Self-pay | Source: Ambulatory Visit | Attending: Vascular Surgery

## 2016-10-14 DIAGNOSIS — Z91041 Radiographic dye allergy status: Secondary | ICD-10-CM | POA: Insufficient documentation

## 2016-10-14 DIAGNOSIS — Z9079 Acquired absence of other genital organ(s): Secondary | ICD-10-CM | POA: Insufficient documentation

## 2016-10-14 DIAGNOSIS — L97528 Non-pressure chronic ulcer of other part of left foot with other specified severity: Secondary | ICD-10-CM | POA: Insufficient documentation

## 2016-10-14 DIAGNOSIS — I70262 Atherosclerosis of native arteries of extremities with gangrene, left leg: Secondary | ICD-10-CM | POA: Diagnosis not present

## 2016-10-14 DIAGNOSIS — Z7902 Long term (current) use of antithrombotics/antiplatelets: Secondary | ICD-10-CM | POA: Insufficient documentation

## 2016-10-14 DIAGNOSIS — I7025 Atherosclerosis of native arteries of other extremities with ulceration: Secondary | ICD-10-CM | POA: Insufficient documentation

## 2016-10-14 DIAGNOSIS — I96 Gangrene, not elsewhere classified: Secondary | ICD-10-CM | POA: Insufficient documentation

## 2016-10-14 DIAGNOSIS — Z91040 Latex allergy status: Secondary | ICD-10-CM | POA: Insufficient documentation

## 2016-10-14 DIAGNOSIS — I110 Hypertensive heart disease with heart failure: Secondary | ICD-10-CM | POA: Insufficient documentation

## 2016-10-14 DIAGNOSIS — Z87891 Personal history of nicotine dependence: Secondary | ICD-10-CM | POA: Insufficient documentation

## 2016-10-14 DIAGNOSIS — N393 Stress incontinence (female) (male): Secondary | ICD-10-CM | POA: Insufficient documentation

## 2016-10-14 DIAGNOSIS — E785 Hyperlipidemia, unspecified: Secondary | ICD-10-CM | POA: Insufficient documentation

## 2016-10-14 DIAGNOSIS — Z8546 Personal history of malignant neoplasm of prostate: Secondary | ICD-10-CM | POA: Insufficient documentation

## 2016-10-14 DIAGNOSIS — N529 Male erectile dysfunction, unspecified: Secondary | ICD-10-CM | POA: Insufficient documentation

## 2016-10-14 DIAGNOSIS — I509 Heart failure, unspecified: Secondary | ICD-10-CM | POA: Insufficient documentation

## 2016-10-14 DIAGNOSIS — Z794 Long term (current) use of insulin: Secondary | ICD-10-CM | POA: Insufficient documentation

## 2016-10-14 DIAGNOSIS — E119 Type 2 diabetes mellitus without complications: Secondary | ICD-10-CM | POA: Insufficient documentation

## 2016-10-14 DIAGNOSIS — Z888 Allergy status to other drugs, medicaments and biological substances status: Secondary | ICD-10-CM | POA: Insufficient documentation

## 2016-10-14 DIAGNOSIS — E11621 Type 2 diabetes mellitus with foot ulcer: Secondary | ICD-10-CM | POA: Insufficient documentation

## 2016-10-14 HISTORY — PX: LOWER EXTREMITY ANGIOGRAPHY: CATH118251

## 2016-10-14 LAB — BUN: BUN: 34 mg/dL — ABNORMAL HIGH (ref 6–20)

## 2016-10-14 LAB — CREATININE, SERUM
CREATININE: 1.22 mg/dL (ref 0.61–1.24)
GFR calc Af Amer: >60 mL/min (ref >60)
GFR calc non Af Amer: 55 mL/min — ABNORMAL LOW (ref >60)

## 2016-10-14 LAB — PROTIME-INR
INR: 3.12
PROTHROMBIN TIME: 32.8 s — AB (ref 11.4–15.2)

## 2016-10-14 LAB — GLUCOSE, CAPILLARY: Glucose-Capillary: 242 mg/dL — ABNORMAL HIGH (ref 65–99)

## 2016-10-14 SURGERY — LOWER EXTREMITY ANGIOGRAPHY
Anesthesia: Moderate Sedation | Laterality: Left

## 2016-10-14 MED ORDER — MIDAZOLAM HCL 5 MG/5ML IJ SOLN
INTRAMUSCULAR | Status: AC
  Filled 2016-10-14: qty 5

## 2016-10-14 MED ORDER — NALOXONE HCL 2 MG/2ML IJ SOSY
PREFILLED_SYRINGE | INTRAMUSCULAR | Status: AC
  Administered 2016-10-14: 2 mg
  Filled 2016-10-14: qty 2

## 2016-10-14 MED ORDER — HEPARIN (PORCINE) IN NACL 2-0.9 UNIT/ML-% IJ SOLN
INTRAMUSCULAR | Status: AC
  Filled 2016-10-14: qty 1000

## 2016-10-14 MED ORDER — SODIUM CHLORIDE 0.9 % IV SOLN
500.0000 mL | Freq: Once | INTRAVENOUS | Status: AC | PRN
  Administered 2016-10-14: 250 mL via INTRAVENOUS

## 2016-10-14 MED ORDER — HYDRALAZINE HCL 20 MG/ML IJ SOLN: 5.0000 mg | INTRAMUSCULAR | Status: DC | PRN

## 2016-10-14 MED ORDER — OXYCODONE-ACETAMINOPHEN 5-325 MG PO TABS
ORAL_TABLET | ORAL | Status: AC
  Filled 2016-10-14: qty 1

## 2016-10-14 MED ORDER — METOPROLOL TARTRATE 5 MG/5ML IV SOLN
INTRAVENOUS | Status: AC
  Administered 2016-10-14: 10:00:00
  Filled 2016-10-14: qty 5

## 2016-10-14 MED ORDER — ONDANSETRON HCL 4 MG/2ML IJ SOLN
INTRAMUSCULAR | Status: AC
  Filled 2016-10-14: qty 2

## 2016-10-14 MED ORDER — FLUMAZENIL 0.5 MG/5ML IV SOLN
0.2000 mg | Freq: Once | INTRAVENOUS | Status: AC
  Administered 2016-10-14: 0.2 mg via INTRAVENOUS

## 2016-10-14 MED ORDER — LABETALOL HCL 5 MG/ML IV SOLN: 10.0000 mg | INTRAVENOUS | Status: DC | PRN

## 2016-10-14 MED ORDER — FLUMAZENIL 0.5 MG/5ML IV SOLN
INTRAVENOUS | Status: AC
  Administered 2016-10-14: 0.2 mg via INTRAVENOUS
  Filled 2016-10-14: qty 5

## 2016-10-14 MED ORDER — OXYCODONE-ACETAMINOPHEN 5-325 MG PO TABS
1.0000 | ORAL_TABLET | ORAL | Status: DC | PRN
  Administered 2016-10-14 (×2): 1 via ORAL

## 2016-10-14 MED ORDER — METOPROLOL TARTRATE 5 MG/5ML IV SOLN: 2.0000 mg | INTRAVENOUS | Status: DC | PRN

## 2016-10-14 MED ORDER — HYDROMORPHONE HCL 1 MG/ML IJ SOLN
0.5000 mg | INTRAMUSCULAR | Status: DC | PRN
  Administered 2016-10-14: 0.5 mg via INTRAVENOUS

## 2016-10-14 MED ORDER — ONDANSETRON HCL 4 MG/2ML IJ SOLN
4.0000 mg | Freq: Four times a day (QID) | INTRAMUSCULAR | Status: DC | PRN
  Administered 2016-10-14: 4 mg via INTRAVENOUS

## 2016-10-14 MED ORDER — CEFAZOLIN SODIUM-DEXTROSE 2-4 GM/100ML-% IV SOLN
2.0000 g | Freq: Once | INTRAVENOUS | Status: AC
  Administered 2016-10-15: 2 g via INTRAVENOUS

## 2016-10-14 MED ORDER — GUAIFENESIN-DM 100-10 MG/5ML PO SYRP
15.0000 mL | ORAL_SOLUTION | ORAL | Status: DC | PRN
  Filled 2016-10-14: qty 15

## 2016-10-14 MED ORDER — NALOXONE HCL 0.4 MG/ML IJ SOLN: 0.4000 mg | INTRAMUSCULAR | Status: DC | PRN

## 2016-10-14 MED ORDER — FENTANYL CITRATE (PF) 100 MCG/2ML IJ SOLN
INTRAMUSCULAR | Status: AC
  Filled 2016-10-14: qty 2

## 2016-10-14 MED ORDER — HEPARIN SODIUM (PORCINE) 1000 UNIT/ML IJ SOLN
INTRAMUSCULAR | Status: AC
  Filled 2016-10-14: qty 1

## 2016-10-14 MED ORDER — MIDAZOLAM HCL 2 MG/2ML IJ SOLN
INTRAMUSCULAR | Status: DC | PRN
  Administered 2016-10-14: 2 mg via INTRAVENOUS
  Administered 2016-10-14 (×2): 1 mg via INTRAVENOUS
  Administered 2016-10-14: 2 mg via INTRAVENOUS
  Administered 2016-10-14: 1 mg via INTRAVENOUS

## 2016-10-14 MED ORDER — ONDANSETRON HCL 4 MG/2ML IJ SOLN: 4.0000 mg | Freq: Four times a day (QID) | INTRAMUSCULAR | Status: DC | PRN

## 2016-10-14 MED ORDER — PHENOL 1.4 % MT LIQD
1.0000 | OROMUCOSAL | Status: DC | PRN
  Filled 2016-10-14: qty 177

## 2016-10-14 MED ORDER — FAMOTIDINE 20 MG PO TABS
40.0000 mg | ORAL_TABLET | ORAL | Status: DC | PRN
  Administered 2016-10-14: 40 mg via ORAL

## 2016-10-14 MED ORDER — ACETAMINOPHEN 325 MG RE SUPP
325.0000 mg | RECTAL | Status: DC | PRN
  Filled 2016-10-14: qty 2

## 2016-10-14 MED ORDER — SODIUM CHLORIDE 0.9 % IV SOLN
INTRAVENOUS | Status: DC
  Administered 2016-10-14: 10:00:00 via INTRAVENOUS

## 2016-10-14 MED ORDER — FAMOTIDINE 20 MG PO TABS
ORAL_TABLET | ORAL | Status: AC
  Filled 2016-10-14: qty 2

## 2016-10-14 MED ORDER — CEFAZOLIN SODIUM-DEXTROSE 2-4 GM/100ML-% IV SOLN
2.0000 g | Freq: Once | INTRAVENOUS | Status: AC
  Administered 2016-10-14: 2 g via INTRAVENOUS

## 2016-10-14 MED ORDER — LIDOCAINE-EPINEPHRINE (PF) 2 %-1:200000 IJ SOLN
INTRAMUSCULAR | Status: AC
  Filled 2016-10-14: qty 20

## 2016-10-14 MED ORDER — FENTANYL CITRATE (PF) 100 MCG/2ML IJ SOLN
INTRAMUSCULAR | Status: DC | PRN
  Administered 2016-10-14: 25 ug via INTRAVENOUS
  Administered 2016-10-14: 50 ug via INTRAVENOUS
  Administered 2016-10-14: 25 ug via INTRAVENOUS
  Administered 2016-10-14 (×2): 50 ug via INTRAVENOUS

## 2016-10-14 MED ORDER — HEPARIN SODIUM (PORCINE) 1000 UNIT/ML IJ SOLN
INTRAMUSCULAR | Status: DC | PRN
  Administered 2016-10-14: 4000 [IU] via INTRAVENOUS

## 2016-10-14 MED ORDER — ACETAMINOPHEN 325 MG PO TABS: 325.0000 mg | ORAL_TABLET | ORAL | Status: DC | PRN

## 2016-10-14 MED ORDER — HYDROMORPHONE HCL 1 MG/ML IJ SOLN
INTRAMUSCULAR | Status: DC
Start: 2016-10-14 — End: 2016-10-14
  Filled 2016-10-14: qty 1

## 2016-10-14 MED ORDER — METOPROLOL TARTRATE 5 MG/5ML IV SOLN: 5.0000 mg | Freq: Once | INTRAVENOUS | Status: DC

## 2016-10-14 SURGICAL SUPPLY — 13 items
BALLN ULTRVRSE 3X150X150 (BALLOONS) ×2
BALLOON ULTRVRSE 3X150X150 (BALLOONS) ×1 IMPLANT
CATH CXI SUPP ANG 4FR 135 (MICROCATHETER) ×1 IMPLANT
CATH CXI SUPP ANG 4FR 135CM (MICROCATHETER) ×2
CATH CXI SUPP ST 2.6FR 150CM (CATHETERS) ×2 IMPLANT
CATH RIM 65CM (CATHETERS) ×2 IMPLANT
DEVICE PRESTO INFLATION (MISCELLANEOUS) ×2 IMPLANT
DEVICE STARCLOSE SE CLOSURE (Vascular Products) ×2 IMPLANT
GLIDEWIRE ADV .035X260CM (WIRE) ×2 IMPLANT
PACK ANGIOGRAPHY (CUSTOM PROCEDURE TRAY) ×2 IMPLANT
SHEATH ANL2 6FRX45 HC (SHEATH) ×2 IMPLANT
SHEATH BRITE TIP 5FRX11 (SHEATH) ×2 IMPLANT
WIRE G V18X300CM (WIRE) ×2 IMPLANT

## 2016-10-14 NOTE — Progress Notes (Signed)
MD notified of patients LOC after 2 hours of recvoery time. Reversals ordered.

## 2016-10-14 NOTE — Op Note (Signed)
[facility] Percutaneous Study/Intervention Procedural Note   Date of Surgery: 10/14/2016  Surgeon(s):Tambria Pfannenstiel   Assistants:none  Pre-operative Diagnosis: PAD with ulceration/gangrene left lower extremity  Post-operative diagnosis: Same  Procedure(s) Performed: 1. Ultrasound guidance for vascular access right femoral artery 2. Catheter placement into left anterior tibial/dorsalis pedis artery from right femoral approach 3. Selective left lower extremity angiogram 4. Percutaneous transluminal angioplasty of left anterior tibial artery/dorsalis pedis artery with 3 mm diameter by 15 cm length angioplasty balloon 5. StarClose closure device right femoral artery  EBL: minimal  Contrast: 40 cc  Fluoro Time: 5 minutes  Moderate Conscious Sedation Time: approximately 30 minutes using 7 mg of Versed and 200 mcg of Fentanyl  Indications: Patient is a 82 y.o.male with non-healing ulcers and gangrenous changes to the left forefoot. The patient has had multiple previous interventions, I felt angiography was necessary for full evaluation to ensure that he has as much perfusion as possible wound healing. The patient is brought in for angiography for further evaluation and potential treatment. Risks and benefits are discussed and informed consent is obtained  Procedure: The patient was identified and appropriate procedural time out was performed. The patient was then placed supine on the table and prepped and draped in the usual sterile fashion.Moderate conscious sedation was administered during a face to face encounter with the patient throughout the procedure with my supervision of the RN administering medicines and monitoring the patient's vital signs, pulse oximetry, telemetry and mental status throughout from the start of the procedure until the patient was taken to the recovery room.  Ultrasound was used to evaluate the right common femoral artery. It was patent . A digital ultrasound image was acquired. A Seldinger needle was used to access the right common femoral artery under direct ultrasound guidance and a permanent image was performed. A 0.035 J wire was advanced without resistance and a 5Fr sheath was placed.An aortogram was not necessary as one was recently done and was okay. I then crossed the aortic bifurcation and advanced to the left femoral head. Selective left lower extremity angiogram was then performed. This demonstrated normal common femoral artery and profunda femoris artery. The previously placed stent in the SFA and popliteal arteries are patent without significant stenosis or occlusion. His popliteal artery is patent. The peroneal artery was continuous without significant stenosis. The anterior tibial artery had a occlusion from the ankle to the distal lower leg over about a 6-8 cm and with distal reconstitution seen in the foot. The posterior tibial artery was chronically occluded. The circulation time was extremely slow from poor cardiac output. The patient was systemically heparinized and a 6 [place] Ansell sheath was then placed over the [employer] wire. I then used a Kumpe catheter and the advantage wire to get down into the tibial vessels and then exchanged for a CXI catheter and a V 18 wire to get into the anterior tibial artery. I crossed the occlusion in the anterior tibial artery and dorsalis pedis artery with the V 18 wire but had to exchange for a smaller CXI catheter to cross the lesion. This was done intraluminal flow was confirmed in the foot. Angioplasty was then performed after replacing the V 18 wire in the dorsalis pedis artery and distal anterior tibial artery with a 3 mm diameter by 15 cm length angioplasty balloon inflated to 10 atm for 1 minute. Completion angiogram showed excellent flow through the treated area with no significant residual  stenosis. The patient now had  two-vessel runoff to the foot. I elected to terminate the procedure. The sheath was removed and StarClose closure device was deployed in the right femoral artery with excellent hemostatic result. The patient was taken to the recovery room in stable condition having tolerated the procedure well.  Findings:   Left Lower Extremity: Normal common femoral artery and profunda femoris artery. The previously placed stent in the SFA and popliteal arteries are patent without significant stenosis or occlusion. His popliteal artery is patent. The peroneal artery was continuous without significant stenosis. The anterior tibial artery had a occlusion from the ankle to the distal lower leg over about a 6-8 cm and with distal reconstitution seen in the foot. The posterior tibial artery was chronically occluded. The circulation time was extremely slow from poor cardiac output.   Disposition: Patient was taken to the recovery room in stable condition having tolerated the procedure well.  Complications: None  Leotis Pain 10/14/2016 11:47 AM   This note was created with Dragon Medical transcription system. Any errors in dictation are purely unintentional.

## 2016-10-14 NOTE — H&P (Signed)
[facility] History & Physical Update  The patient was interviewed and re-examined.  The patient's previous History and Physical has been reviewed and is unchanged.  There is no change in the plan of care. We plan to proceed with the scheduled procedure.  Leotis Pain, MD  10/14/2016, 9:11 AM

## 2016-10-14 NOTE — Progress Notes (Signed)
Reversals x 2 administered per MD order. No change in LOC after narcan administered, patient awake and alert after flumazenil administered. Patient awake at this time. Will continue to monitor.

## 2016-10-15 ENCOUNTER — Encounter
Admission: RE | Disposition: A | Discharge status: Home / Self Care | Payer: Self-pay | Source: Ambulatory Visit | Attending: Podiatry

## 2016-10-15 ENCOUNTER — Ambulatory Visit
Admission: RE | Admit: 2016-10-15 | Discharge: 2016-10-15 | Disposition: A | Discharge status: Home / Self Care | Payer: Medicare Other | Source: Ambulatory Visit | Attending: Podiatry | Admitting: Podiatry

## 2016-10-15 ENCOUNTER — Encounter: Payer: Self-pay | Admitting: *Deleted

## 2016-10-15 ENCOUNTER — Ambulatory Visit: Payer: Medicare Other | Admitting: Certified Registered"

## 2016-10-15 DIAGNOSIS — Z91040 Latex allergy status: Secondary | ICD-10-CM | POA: Diagnosis not present

## 2016-10-15 DIAGNOSIS — I110 Hypertensive heart disease with heart failure: Secondary | ICD-10-CM | POA: Diagnosis not present

## 2016-10-15 DIAGNOSIS — Z91041 Radiographic dye allergy status: Secondary | ICD-10-CM | POA: Diagnosis not present

## 2016-10-15 DIAGNOSIS — Z794 Long term (current) use of insulin: Secondary | ICD-10-CM | POA: Diagnosis not present

## 2016-10-15 DIAGNOSIS — M869 Osteomyelitis, unspecified: Secondary | ICD-10-CM | POA: Insufficient documentation

## 2016-10-15 DIAGNOSIS — Z87891 Personal history of nicotine dependence: Secondary | ICD-10-CM | POA: Diagnosis not present

## 2016-10-15 DIAGNOSIS — Z7982 Long term (current) use of aspirin: Secondary | ICD-10-CM | POA: Insufficient documentation

## 2016-10-15 DIAGNOSIS — I96 Gangrene, not elsewhere classified: Secondary | ICD-10-CM | POA: Diagnosis not present

## 2016-10-15 DIAGNOSIS — I482 Chronic atrial fibrillation: Secondary | ICD-10-CM | POA: Insufficient documentation

## 2016-10-15 DIAGNOSIS — I509 Heart failure, unspecified: Secondary | ICD-10-CM | POA: Diagnosis not present

## 2016-10-15 DIAGNOSIS — Z7901 Long term (current) use of anticoagulants: Secondary | ICD-10-CM | POA: Diagnosis not present

## 2016-10-15 DIAGNOSIS — L97529 Non-pressure chronic ulcer of other part of left foot with unspecified severity: Secondary | ICD-10-CM | POA: Diagnosis not present

## 2016-10-15 DIAGNOSIS — E1169 Type 2 diabetes mellitus with other specified complication: Secondary | ICD-10-CM | POA: Diagnosis not present

## 2016-10-15 DIAGNOSIS — E11621 Type 2 diabetes mellitus with foot ulcer: Secondary | ICD-10-CM | POA: Insufficient documentation

## 2016-10-15 DIAGNOSIS — E1152 Type 2 diabetes mellitus with diabetic peripheral angiopathy with gangrene: Secondary | ICD-10-CM | POA: Insufficient documentation

## 2016-10-15 DIAGNOSIS — Z79899 Other long term (current) drug therapy: Secondary | ICD-10-CM | POA: Diagnosis not present

## 2016-10-15 DIAGNOSIS — G4730 Sleep apnea, unspecified: Secondary | ICD-10-CM | POA: Insufficient documentation

## 2016-10-15 HISTORY — PX: IRRIGATION AND DEBRIDEMENT FOOT: SHX6602

## 2016-10-15 HISTORY — PX: APLIGRAFT PLACEMENT: SHX5228

## 2016-10-15 LAB — GLUCOSE, CAPILLARY
Glucose-Capillary: 237 mg/dL — ABNORMAL HIGH (ref 65–99)
Glucose-Capillary: 195 mg/dL — ABNORMAL HIGH (ref 65–99)

## 2016-10-15 SURGERY — IRRIGATION AND DEBRIDEMENT FOOT
Anesthesia: General | Site: Foot | Laterality: Left | Wound class: Other (see notes)

## 2016-10-15 MED ORDER — PHENYLEPHRINE HCL 10 MG/ML IJ SOLN
INTRAMUSCULAR | Status: AC
  Filled 2016-10-15: qty 1

## 2016-10-15 MED ORDER — FENTANYL CITRATE (PF) 100 MCG/2ML IJ SOLN: 25.0000 ug | INTRAMUSCULAR | Status: DC | PRN

## 2016-10-15 MED ORDER — BUPIVACAINE LIPOSOME 1.3 % IJ SUSP
INTRAMUSCULAR | Status: AC
  Filled 2016-10-15: qty 20

## 2016-10-15 MED ORDER — DILTIAZEM HCL 100 MG IV SOLR
5.0000 mg/h | INTRAVENOUS | Status: DC
  Filled 2016-10-15: qty 100

## 2016-10-15 MED ORDER — FAMOTIDINE 20 MG PO TABS
ORAL_TABLET | ORAL | Status: AC
  Filled 2016-10-15: qty 1

## 2016-10-15 MED ORDER — FENTANYL CITRATE (PF) 100 MCG/2ML IJ SOLN
INTRAMUSCULAR | Status: AC
  Filled 2016-10-15: qty 2

## 2016-10-15 MED ORDER — PROPOFOL 500 MG/50ML IV EMUL
INTRAVENOUS | Status: AC
  Filled 2016-10-15: qty 50

## 2016-10-15 MED ORDER — PROPOFOL 10 MG/ML IV BOLUS
INTRAVENOUS | Status: DC | PRN
  Administered 2016-10-15 (×2): 30 mg via INTRAVENOUS

## 2016-10-15 MED ORDER — BUPIVACAINE LIPOSOME 1.3 % IJ SUSP
INTRAMUSCULAR | Status: DC | PRN
  Administered 2016-10-15: 20 mL

## 2016-10-15 MED ORDER — ONDANSETRON HCL 4 MG/2ML IJ SOLN
INTRAMUSCULAR | Status: AC
  Filled 2016-10-15: qty 2

## 2016-10-15 MED ORDER — MIDAZOLAM HCL 2 MG/2ML IJ SOLN
INTRAMUSCULAR | Status: AC
  Filled 2016-10-15: qty 2

## 2016-10-15 MED ORDER — PROPOFOL 10 MG/ML IV BOLUS
INTRAVENOUS | Status: AC
  Filled 2016-10-15: qty 20

## 2016-10-15 MED ORDER — OXYCODONE-ACETAMINOPHEN 7.5-325 MG PO TABS: 1.0000 | ORAL_TABLET | ORAL | 0 refills | Status: DC | PRN

## 2016-10-15 MED ORDER — LIDOCAINE HCL (PF) 1 % IJ SOLN
INTRAMUSCULAR | Status: AC
  Filled 2016-10-15: qty 30

## 2016-10-15 MED ORDER — LIDOCAINE HCL 1 % IJ SOLN
INTRAMUSCULAR | Status: DC | PRN
  Administered 2016-10-15: 20 mL

## 2016-10-15 MED ORDER — PROPOFOL 500 MG/50ML IV EMUL
INTRAVENOUS | Status: DC | PRN
  Administered 2016-10-15: 50 ug/kg/min via INTRAVENOUS

## 2016-10-15 MED ORDER — FAMOTIDINE 20 MG PO TABS
20.0000 mg | ORAL_TABLET | Freq: Once | ORAL | Status: AC
Start: 2016-10-15 — End: 2016-10-15
  Administered 2016-10-15: 20 mg via ORAL

## 2016-10-15 MED ORDER — METOPROLOL TARTRATE 5 MG/5ML IV SOLN
INTRAVENOUS | Status: AC
  Filled 2016-10-15: qty 5

## 2016-10-15 MED ORDER — BUPIVACAINE HCL 0.5 % IJ SOLN
INTRAMUSCULAR | Status: DC | PRN
  Administered 2016-10-15: 10 mL
  Administered 2016-10-15: 20 mL

## 2016-10-15 MED ORDER — ONDANSETRON HCL 4 MG/2ML IJ SOLN: 4.0000 mg | Freq: Once | INTRAMUSCULAR | Status: DC | PRN

## 2016-10-15 MED ORDER — CEFAZOLIN SODIUM-DEXTROSE 2-4 GM/100ML-% IV SOLN
INTRAVENOUS | Status: AC
  Filled 2016-10-15: qty 100

## 2016-10-15 MED ORDER — BUPIVACAINE HCL (PF) 0.5 % IJ SOLN
INTRAMUSCULAR | Status: AC
  Filled 2016-10-15: qty 30

## 2016-10-15 MED ORDER — METOPROLOL TARTRATE 5 MG/5ML IV SOLN
INTRAVENOUS | Status: DC | PRN
  Administered 2016-10-15: 3 mg via INTRAVENOUS
  Administered 2016-10-15: 2 mg via INTRAVENOUS

## 2016-10-15 MED ORDER — LIDOCAINE HCL (PF) 2 % IJ SOLN
INTRAMUSCULAR | Status: AC
  Filled 2016-10-15: qty 2

## 2016-10-15 MED ORDER — LIDOCAINE HCL (CARDIAC) 20 MG/ML IV SOLN
INTRAVENOUS | Status: DC | PRN
  Administered 2016-10-15: 60 mg via INTRAVENOUS

## 2016-10-15 MED ORDER — SODIUM CHLORIDE 0.9 % IV SOLN
INTRAVENOUS | Status: DC
  Administered 2016-10-15 (×2): via INTRAVENOUS

## 2016-10-15 MED ORDER — ONDANSETRON HCL 4 MG/2ML IJ SOLN
INTRAMUSCULAR | Status: DC | PRN
  Administered 2016-10-15: 4 mg via INTRAVENOUS

## 2016-10-15 SURGICAL SUPPLY — 43 items
APLIGRAF (Prosthesis & Implant Plastic) ×2 IMPLANT
BAG COUNTER SPONGE EZ (MISCELLANEOUS) IMPLANT
BANDAGE ELASTIC 3 LF NS (GAUZE/BANDAGES/DRESSINGS) ×2 IMPLANT
BANDAGE ELASTIC 4 LF NS (GAUZE/BANDAGES/DRESSINGS) ×2 IMPLANT
BANDAGE STRETCH 3X4.1 STRL (GAUZE/BANDAGES/DRESSINGS) ×2 IMPLANT
BNDG ESMARK 4X12 TAN STRL LF (GAUZE/BANDAGES/DRESSINGS) ×2 IMPLANT
BNDG GAUZE 4.5X4.1 6PLY STRL (MISCELLANEOUS) ×2 IMPLANT
CANISTER SUCT 1200ML W/VALVE (MISCELLANEOUS) ×2 IMPLANT
CUFF TOURN 18 STER (MISCELLANEOUS) IMPLANT
CUFF TOURN DUAL PL 12 NO SLV (MISCELLANEOUS) ×2 IMPLANT
DRAPE FLUOR MINI C-ARM 54X84 (DRAPES) IMPLANT
DRSG MEPITEL 4X7.2 (GAUZE/BANDAGES/DRESSINGS) ×2 IMPLANT
DURAPREP 26ML APPLICATOR (WOUND CARE) ×2 IMPLANT
ELECT REM PT RETURN 9FT ADLT (ELECTROSURGICAL) ×2
ELECTRODE REM PT RTRN 9FT ADLT (ELECTROSURGICAL) ×1 IMPLANT
GAUZE PETRO XEROFOAM 1X8 (MISCELLANEOUS) ×2 IMPLANT
GAUZE PETROLATUM 1 X8 (GAUZE/BANDAGES/DRESSINGS) ×2 IMPLANT
GAUZE SPONGE 4X4 12PLY STRL (GAUZE/BANDAGES/DRESSINGS) ×2 IMPLANT
GLOVE BIO SURGEON STRL SZ8 (GLOVE) ×2 IMPLANT
GLOVE INDICATOR 8.0 STRL GRN (GLOVE) ×2 IMPLANT
GOWN STRL REUS W/ TWL LRG LVL3 (GOWN DISPOSABLE) ×1 IMPLANT
GOWN STRL REUS W/TWL LRG LVL3 (GOWN DISPOSABLE) ×1
GRAFT APLIGRAF (Prosthesis & Implant Plastic) ×1 IMPLANT
HANDPIECE VERSAJET DEBRIDEMENT (MISCELLANEOUS) ×2 IMPLANT
KIT RM TURNOVER STRD PROC AR (KITS) ×2 IMPLANT
LABEL OR SOLS (LABEL) IMPLANT
NEEDLE FILTER BLUNT 18X 1/2SAF (NEEDLE) ×1
NEEDLE FILTER BLUNT 18X1 1/2 (NEEDLE) ×1 IMPLANT
NEEDLE HYPO 25X1 1.5 SAFETY (NEEDLE) ×4 IMPLANT
NS IRRIG 500ML POUR BTL (IV SOLUTION) ×2 IMPLANT
PACK EXTREMITY ARMC (MISCELLANEOUS) ×2 IMPLANT
PAD ABD DERMACEA PRESS 5X9 (GAUZE/BANDAGES/DRESSINGS) ×2 IMPLANT
STOCKINETTE STRL 6IN 960660 (GAUZE/BANDAGES/DRESSINGS) ×2 IMPLANT
SUT ETHILON 3-0 FS-10 30 BLK (SUTURE) ×2
SUT ETHILON 4-0 (SUTURE) ×1
SUT ETHILON 4-0 FS2 18XMFL BLK (SUTURE) ×1
SUT VIC AB 3-0 SH 27 (SUTURE) ×2
SUT VIC AB 3-0 SH 27X BRD (SUTURE) ×2 IMPLANT
SUT VIC AB 4-0 FS2 27 (SUTURE) IMPLANT
SUTURE EHLN 3-0 FS-10 30 BLK (SUTURE) ×1 IMPLANT
SUTURE ETHLN 4-0 FS2 18XMF BLK (SUTURE) ×1 IMPLANT
SYR 3ML LL SCALE MARK (SYRINGE) ×4 IMPLANT
SYRINGE 10CC LL (SYRINGE) ×2 IMPLANT

## 2016-10-15 NOTE — H&P (Signed)
H and P has been reviewed and no changes are noted.  

## 2016-10-15 NOTE — Transfer of Care (Signed)
Immediate Anesthesia Transfer of Care Note  Patient: [name]  Procedure(s) Performed: Procedure(s) with comments: IRRIGATION AND DEBRIDEMENT FOOT-EXCISIONAL DEBRIDEMENT OF SKIN 3RD, 4TH AND 5TH TOES WITH APPLICATION OF APLIGRAFT (Left) - necrotic,gangrene APLIGRAFT PLACEMENT (Left) - necrotic ulcer  Patient Location: PACU  Anesthesia Type:General  Level of Consciousness: drowsy and patient cooperative  Airway & Oxygen Therapy: Patient Spontanous Breathing and Patient connected to face mask oxygen  Post-op Assessment: Report given to RN, Post -op Vital signs reviewed and stable and Patient moving all extremities X 4  Post vital signs: Reviewed and stable  Last Vitals:  Vitals:   10/15/16 0602 10/15/16 0900  BP: 133/87 103/68  Pulse: 82 91  Resp: 18 13  Temp: (!) 35.9 C 36.3 C    Last Pain:  Vitals:   10/15/16 0602  TempSrc: Tympanic         Complications: No apparent anesthesia complications

## 2016-10-15 NOTE — OR Nursing (Signed)
Dr. Elvina Mattes into see pt to discuss what he did during surgery.  Dr. Elvina Mattes informed pt pain at ankle is expected.

## 2016-10-15 NOTE — Anesthesia Preprocedure Evaluation (Addendum)
Anesthesia Evaluation  Patient identified by MRN, date of birth, ID band Patient awake    Reviewed: Allergy & Precautions, H&P , NPO status , Patient's Chart, lab work & pertinent test results, reviewed documented beta blocker date and time   Airway Mallampati: II  TM Distance: >3 FB Neck ROM: full    Dental  (+) Teeth Intact, Poor Dentition   Pulmonary neg pulmonary ROS, sleep apnea , pneumonia, former smoker,    Pulmonary exam normal        Cardiovascular Exercise Tolerance: Good hypertension, + Peripheral Vascular Disease and +CHF  negative cardio ROS Normal cardiovascular exam Rate:Normal     Neuro/Psych negative neurological ROS  negative psych ROS   GI/Hepatic negative GI ROS, Neg liver ROS,   Endo/Other  negative endocrine ROSdiabetes  Renal/GU Renal diseasenegative Renal ROS  negative genitourinary   Musculoskeletal  (+) Arthritis ,   Abdominal   Peds  Hematology negative hematology ROS (+)   Anesthesia Other Findings   Reproductive/Obstetrics negative OB ROS                            Anesthesia Physical Anesthesia Plan  ASA: III  Anesthesia Plan: MAC   Post-op Pain Management:    Induction:   Airway Management Planned:   Additional Equipment:   Intra-op Plan:   Post-operative Plan:   Informed Consent: I have reviewed the patients History and Physical, chart, labs and discussed the procedure including the risks, benefits and alternatives for the proposed anesthesia with the patient or authorized representative who has indicated his/her understanding and acceptance.     Plan Discussed with: CRNA  Anesthesia Plan Comments: ([place] regarding need for GA vs. Ankle block or other regional options.  JA)       Anesthesia Quick Evaluation

## 2016-10-15 NOTE — OR Nursing (Signed)
Do not need to send pt's postop shoe with him to OR per Dr. Elvina Mattes

## 2016-10-15 NOTE — Anesthesia Postprocedure Evaluation (Signed)
Anesthesia Post Note  Patient: [name]  Procedure(s) Performed: Procedure(s) (LRB): IRRIGATION AND DEBRIDEMENT FOOT-EXCISIONAL DEBRIDEMENT OF SKIN 3RD, 4TH AND 5TH TOES WITH APPLICATION OF APLIGRAFT (Left) APLIGRAFT PLACEMENT (Left)  Patient location during evaluation: PACU Anesthesia Type: General Level of consciousness: awake and alert Pain management: pain level controlled Vital Signs Assessment: post-procedure vital signs reviewed and stable Respiratory status: spontaneous breathing, nonlabored ventilation, respiratory function stable and patient connected to nasal cannula oxygen Cardiovascular status: blood pressure returned to baseline and stable Postop Assessment: no signs of nausea or vomiting Anesthetic complications: no     Last Vitals:  Vitals:   10/15/16 1001 10/15/16 1050  BP: 130/76 108/90  Pulse: 68 74  Resp:  18  Temp: (!) 35.7 C     Last Pain:  Vitals:   10/15/16 1050  TempSrc:   PainSc: 2                  Molli Barrows

## 2016-10-15 NOTE — OR Nursing (Signed)
cpap to be reviewed by CI

## 2016-10-15 NOTE — Op Note (Signed)
Operative note   Surgeon: Dr. Albertine Patricia, DPM.    Assistant: None    Preop diagnosis: Gangrene third fourth and fifth toes left foot along with chronic ulceration plantar left foot    Postop diagnosis: Same    Procedure:   1. Amputation third fourth and fifth toes left foot   2. Wound site preparation ulceration to the plantar left foot. Wound measured 1.5 cm in length and 3 cm in width and 7 mm of depth   3. Application of Apligraf to plantar left foot wound     EBL: Less than 20 cc    Anesthesia:IV sedation delivered by the anesthesia team. I did a lidocaine Marcaine block with 1% lidocaine 0.5% Marcaine plain used 20 cc at the beginning of the case. At the end of the case 12 cc of Exprel  mixed with 0.5% Marcaine was injected the base of the operative sites.    Hemostasis: None    Specimen: Amputated gangrenous toes    Complications: None    Operative indications: Chronic nonhealing wounds and gangrene to the third fourth and fifth toes of the left foot    Procedure:  Patient was brought into the OR and placed on the operating table in thesupine position. After anesthesia was obtained theleft lower extremity was prepped and draped in usual sterile fashion.  Operative Report: Attention was directed to the left foot where 2 semielliptical incisions were made starting at the medial base of the third toe and extending to the lateral base of the fifth toe. Incision was carried down to bone at this point soft tissue was freed away from the metatarsal phalangeal joints of each of these toes. The toes were disarticulated at the MTP joints and removed. Capillary bleeding occurred but it was somewhat slow. There was copiously irrigated at this point and 3-0 Vicryl was used to secure subcutaneous tissues. Skin was enclosed with 5-0 nylon simple interrupted sutures. The wound closed nicely. Prior to doing the amputation the wounds were checked in between the digits these extended all way  down to bone and no bleeding occurred the areas.  This time a plantar wound as described earlier was debrided the dark necrotic eschar was removed the wound combined use of a 15 scalpel blade and versa jet. The Apligraf was then placed across the area and sutured with 3-0 nylon simple interrupted sutures. This is and covered with Vaseline gauze and then Mepitel for security.  At this time the area was blocked with the long-lasting anesthetic. The area was dressed with Xeroform gauze 4 x 4's Kling a deep pad and Kerlix wrap.    Patient tolerated the procedure and anesthesia well.  Was transported from the OR to the PACU with all vital signs stable and vascular status intact. To be discharged per routine protocol.  Will follow up in approximately 1 week in the outpatient clinic.

## 2016-10-15 NOTE — Anesthesia Post-op Follow-up Note (Cosign Needed)
Anesthesia QCDR form completed.        

## 2016-10-15 NOTE — Discharge Instructions (Addendum)
[place] DR. Franklin   1. Take your medication as prescribed.  Pain medication should be taken only as needed. Resume your warfarin today.  2. Keep the dressing clean, dry and intact.  3. Do not recommend elevating foot very much. Elevated no higher than the waist when sitting up. Okay for the foot to be down some.  4. Walking to the bathroom and brief periods of walking are acceptable, make sure use the shoe appropriately and use the walker for balance and assistance.  5. Always wear your post-op shoe when walking. Do not lead forefoot part of shoes but the ground. Use a walker for assistance. Do not take a shower. Baths are permissible as long as the foot is kept out of the water.   6. Every hour you are awake:  - Bend your knee 15 times. - Flex foot 15 times - Massage calf 15 times  7. Call [facility] [phone]) if any of the following problems occur: - You develop a temperature or fever. - The bandage becomes saturated with blood. - Medication does not stop your pain. - Injury of the foot occurs. - Any symptoms of infection including redness, odor, or red streaks running from wound.   AMBULATORY SURGERY  DISCHARGE INSTRUCTIONS   1) The drugs that you were given will stay in your system until tomorrow so for the next 24 hours you should not:  A) Drive an automobile B) Make any legal decisions C) Drink any alcoholic beverage   2) You may resume regular meals tomorrow.  Today it is better to start with liquids and gradually work up to solid foods.  You may eat anything you prefer, but it is better to start with liquids, then soup and crackers, and gradually work up to solid foods.   3) Please notify your doctor immediately if you have any unusual bleeding, trouble breathing, redness and pain at the surgery site, drainage, fever, or  pain not relieved by medication.   4) Additional Instructions:  -

## 2016-10-19 ENCOUNTER — Encounter (INDEPENDENT_AMBULATORY_CARE_PROVIDER_SITE_OTHER): Payer: Medicare Other | Admitting: Vascular Surgery

## 2016-10-19 ENCOUNTER — Encounter: Payer: Self-pay | Admitting: Podiatry

## 2016-10-19 ENCOUNTER — Encounter (INDEPENDENT_AMBULATORY_CARE_PROVIDER_SITE_OTHER): Payer: Self-pay

## 2016-10-19 ENCOUNTER — Encounter (INDEPENDENT_AMBULATORY_CARE_PROVIDER_SITE_OTHER): Payer: Medicare Other

## 2016-10-19 LAB — SURGICAL PATHOLOGY

## 2016-10-21 ENCOUNTER — Emergency Department: Payer: Medicare Other

## 2016-10-21 ENCOUNTER — Observation Stay
Admission: EM | Admit: 2016-10-21 | Discharge: 2016-10-24 | Disposition: A | Discharge status: Skilled Nursing Facility | Payer: Medicare Other | Attending: Specialist | Admitting: Specialist

## 2016-10-21 DIAGNOSIS — Z91041 Radiographic dye allergy status: Secondary | ICD-10-CM | POA: Insufficient documentation

## 2016-10-21 DIAGNOSIS — E1151 Type 2 diabetes mellitus with diabetic peripheral angiopathy without gangrene: Secondary | ICD-10-CM | POA: Insufficient documentation

## 2016-10-21 DIAGNOSIS — Z7901 Long term (current) use of anticoagulants: Secondary | ICD-10-CM | POA: Diagnosis not present

## 2016-10-21 DIAGNOSIS — G4733 Obstructive sleep apnea (adult) (pediatric): Secondary | ICD-10-CM | POA: Diagnosis not present

## 2016-10-21 DIAGNOSIS — Z9989 Dependence on other enabling machines and devices: Secondary | ICD-10-CM | POA: Diagnosis not present

## 2016-10-21 DIAGNOSIS — Y92009 Unspecified place in unspecified non-institutional (private) residence as the place of occurrence of the external cause: Secondary | ICD-10-CM | POA: Diagnosis not present

## 2016-10-21 DIAGNOSIS — I130 Hypertensive heart and chronic kidney disease with heart failure and stage 1 through stage 4 chronic kidney disease, or unspecified chronic kidney disease: Secondary | ICD-10-CM | POA: Insufficient documentation

## 2016-10-21 DIAGNOSIS — Z91040 Latex allergy status: Secondary | ICD-10-CM | POA: Diagnosis not present

## 2016-10-21 DIAGNOSIS — R7401 Elevation of levels of liver transaminase levels: Secondary | ICD-10-CM

## 2016-10-21 DIAGNOSIS — G8918 Other acute postprocedural pain: Secondary | ICD-10-CM | POA: Diagnosis present

## 2016-10-21 DIAGNOSIS — R29898 Other symptoms and signs involving the musculoskeletal system: Secondary | ICD-10-CM

## 2016-10-21 DIAGNOSIS — I482 Chronic atrial fibrillation, unspecified: Secondary | ICD-10-CM | POA: Diagnosis present

## 2016-10-21 DIAGNOSIS — W010XXA Fall on same level from slipping, tripping and stumbling without subsequent striking against object, initial encounter: Secondary | ICD-10-CM | POA: Diagnosis not present

## 2016-10-21 DIAGNOSIS — Z7982 Long term (current) use of aspirin: Secondary | ICD-10-CM | POA: Diagnosis not present

## 2016-10-21 DIAGNOSIS — Z8546 Personal history of malignant neoplasm of prostate: Secondary | ICD-10-CM | POA: Insufficient documentation

## 2016-10-21 DIAGNOSIS — W19XXXA Unspecified fall, initial encounter: Secondary | ICD-10-CM

## 2016-10-21 DIAGNOSIS — E119 Type 2 diabetes mellitus without complications: Secondary | ICD-10-CM

## 2016-10-21 DIAGNOSIS — R260 Ataxic gait: Principal | ICD-10-CM | POA: Insufficient documentation

## 2016-10-21 DIAGNOSIS — L03818 Cellulitis of other sites: Secondary | ICD-10-CM | POA: Diagnosis present

## 2016-10-21 DIAGNOSIS — E118 Type 2 diabetes mellitus with unspecified complications: Secondary | ICD-10-CM

## 2016-10-21 DIAGNOSIS — I10 Essential (primary) hypertension: Secondary | ICD-10-CM | POA: Diagnosis present

## 2016-10-21 DIAGNOSIS — M79659 Pain in unspecified thigh: Secondary | ICD-10-CM

## 2016-10-21 DIAGNOSIS — E1122 Type 2 diabetes mellitus with diabetic chronic kidney disease: Secondary | ICD-10-CM | POA: Diagnosis not present

## 2016-10-21 DIAGNOSIS — E785 Hyperlipidemia, unspecified: Secondary | ICD-10-CM | POA: Insufficient documentation

## 2016-10-21 DIAGNOSIS — Z87891 Personal history of nicotine dependence: Secondary | ICD-10-CM | POA: Diagnosis not present

## 2016-10-21 DIAGNOSIS — E1140 Type 2 diabetes mellitus with diabetic neuropathy, unspecified: Secondary | ICD-10-CM | POA: Insufficient documentation

## 2016-10-21 DIAGNOSIS — R296 Repeated falls: Secondary | ICD-10-CM | POA: Diagnosis not present

## 2016-10-21 DIAGNOSIS — M79605 Pain in left leg: Secondary | ICD-10-CM | POA: Diagnosis not present

## 2016-10-21 DIAGNOSIS — R531 Weakness: Secondary | ICD-10-CM | POA: Insufficient documentation

## 2016-10-21 DIAGNOSIS — Z79899 Other long term (current) drug therapy: Secondary | ICD-10-CM | POA: Insufficient documentation

## 2016-10-21 DIAGNOSIS — I509 Heart failure, unspecified: Secondary | ICD-10-CM | POA: Insufficient documentation

## 2016-10-21 DIAGNOSIS — R791 Abnormal coagulation profile: Secondary | ICD-10-CM | POA: Insufficient documentation

## 2016-10-21 DIAGNOSIS — R270 Ataxia, unspecified: Secondary | ICD-10-CM

## 2016-10-21 DIAGNOSIS — N189 Chronic kidney disease, unspecified: Secondary | ICD-10-CM | POA: Diagnosis not present

## 2016-10-21 DIAGNOSIS — Z888 Allergy status to other drugs, medicaments and biological substances status: Secondary | ICD-10-CM | POA: Insufficient documentation

## 2016-10-21 DIAGNOSIS — Z4781 Encounter for orthopedic aftercare following surgical amputation: Secondary | ICD-10-CM | POA: Diagnosis not present

## 2016-10-21 DIAGNOSIS — Z89422 Acquired absence of other left toe(s): Secondary | ICD-10-CM | POA: Diagnosis not present

## 2016-10-21 DIAGNOSIS — I739 Peripheral vascular disease, unspecified: Secondary | ICD-10-CM | POA: Diagnosis present

## 2016-10-21 DIAGNOSIS — Z794 Long term (current) use of insulin: Secondary | ICD-10-CM | POA: Insufficient documentation

## 2016-10-21 DIAGNOSIS — R740 Nonspecific elevation of levels of transaminase and lactic acid dehydrogenase [LDH]: Secondary | ICD-10-CM | POA: Insufficient documentation

## 2016-10-21 LAB — COMPREHENSIVE METABOLIC PANEL
ALT: 93 U/L — ABNORMAL HIGH (ref 17–63)
ANION GAP: 13 (ref 5–15)
AST: 64 U/L — ABNORMAL HIGH (ref 15–41)
Albumin: 3.4 g/dL — ABNORMAL LOW (ref 3.5–5.0)
Alkaline Phosphatase: 371 U/L — ABNORMAL HIGH (ref 38–126)
BILIRUBIN TOTAL: 0.8 mg/dL (ref 0.3–1.2)
BUN: 29 mg/dL — ABNORMAL HIGH (ref 6–20)
CHLORIDE: 99 mmol/L — AB (ref 101–111)
CO2: 23 mmol/L (ref 22–32)
Calcium: 8.7 mg/dL — ABNORMAL LOW (ref 8.9–10.3)
Creatinine, Ser: 1.18 mg/dL (ref 0.61–1.24)
GFR, EST NON AFRICAN AMERICAN: 57 mL/min — AB (ref >60)
Glucose, Bld: 162 mg/dL — ABNORMAL HIGH (ref 65–99)
POTASSIUM: 3.8 mmol/L (ref 3.5–5.1)
Sodium: 135 mmol/L (ref 135–145)
TOTAL PROTEIN: 7.5 g/dL (ref 6.5–8.1)

## 2016-10-21 LAB — CBC WITH DIFFERENTIAL/PLATELET
BASOS ABS: 0.1 10*3/uL (ref 0–0.1)
Basophils Relative: 1 %
EOS PCT: 4 %
Eosinophils Absolute: 0.5 10*3/uL (ref 0–0.7)
HEMATOCRIT: 42.3 % (ref 40.0–52.0)
Hemoglobin: 13.9 g/dL (ref 13.0–18.0)
LYMPHS ABS: 1.8 10*3/uL (ref 1.0–3.6)
LYMPHS PCT: 12 %
MCH: 28.1 pg (ref 26.0–34.0)
MCHC: 32.8 g/dL (ref 32.0–36.0)
MCV: 85.7 fL (ref 80.0–100.0)
MONO ABS: 1.6 10*3/uL — AB (ref 0.2–1.0)
MONOS PCT: 11 %
NEUTROS ABS: 11.1 10*3/uL — AB (ref 1.4–6.5)
Neutrophils Relative %: 72 %
PLATELETS: 298 10*3/uL (ref 150–440)
RBC: 4.94 MIL/uL (ref 4.40–5.90)
RDW: 15.1 % — AB (ref 11.5–14.5)
WBC: 15.2 10*3/uL — ABNORMAL HIGH (ref 3.8–10.6)

## 2016-10-21 LAB — TROPONIN I: Troponin I: <0.03 ng/mL (ref <0.03)

## 2016-10-21 MED ORDER — VANCOMYCIN HCL IN DEXTROSE 1-5 GM/200ML-% IV SOLN
1000.0000 mg | Freq: Once | INTRAVENOUS | Status: AC
  Administered 2016-10-21: 1000 mg via INTRAVENOUS
  Filled 2016-10-21 (×2): qty 200

## 2016-10-21 NOTE — ED Provider Notes (Signed)
[facility] Emergency Department Provider Note       Time seen: ----------------------------------------- 7:55 PM on 10/21/2016 -----------------------------------------     I have reviewed the triage vital signs and the nursing notes.   HISTORY   Chief Complaint Fall    HPI [name] is a 82 y.o. male who presents to the ED for a fall that occurred at home today. Patient states he lost his balance today and fell, he denies any loss of consciousness, dizziness or other complaints. Patient recently had toe surgery on his left foot on Friday, due to recent falls seems encouraged to come here by his podiatrist for possible rehabilitation placement. Patient states he can't walk even with a walker because he loses his balance. He denies fevers or chills, he is having persistent left foot pain but denies any other symptoms.   Past Medical History:  Diagnosis Date  . Arthritis   . Atrial fibrillation ([place])   . Carcinoma of prostate ([place])   . CHF (congestive heart failure) ([place])   . Chronic kidney disease   . Diabetes mellitus without complication ([place])   . ED (erectile dysfunction)   . Frequent urination   . Hyperlipidemia   . Hypertension   . Moderate mitral insufficiency   . Peripheral vascular disease ([place])   . Pneumonia 04/2016  . Prostate cancer ([place])   . Sleep apnea    OSA--USE C-PAP  . Ulcer of left foot due to type 2 diabetes mellitus ([place])   . Urinary stress incontinence, male     Patient Active Problem List   Diagnosis Date Noted  . Atherosclerosis of native arteries of the extremities with ulceration ([place]) 10/12/2016  . History of femoral angiogram 09/06/2016  . Left leg pain 09/06/2016  . Leukocytosis 09/06/2016  . Generalized weakness 09/06/2016  . Acute renal injury ([place]) 09/06/2016  . A-fib ([place]) 08/30/2016  . Pneumonia 05/19/2016  . Hyperlipidemia 04/20/2016  . Leg pain, left 04/19/2016  . Back pain 04/19/2016  . Type  2 diabetes mellitus treated with insulin ([place]) 04/19/2016  . Essential hypertension 04/19/2016  . PAD (peripheral artery disease) ([place]) 04/19/2016  . Erectile dysfunction following radical prostatectomy 06/25/2015  . History of prostate cancer 12/23/2014  . Incontinence 12/23/2014    Past Surgical History:  Procedure Laterality Date  . APLIGRAFT PLACEMENT Left 10/15/2016   Procedure: APLIGRAFT PLACEMENT;  Surgeon: Albertine Patricia, DPM;  Location: [facility] ORS;  Service: Podiatry;  Laterality: Left;  necrotic ulcer  . CHOLECYSTECTOMY    . EYE SURGERY Bilateral    Cataract Extraction with IOL  . IRRIGATION AND DEBRIDEMENT FOOT Left 10/15/2016   Procedure: IRRIGATION AND DEBRIDEMENT FOOT-EXCISIONAL DEBRIDEMENT OF SKIN 3RD, 4TH AND 5TH TOES WITH APPLICATION OF APLIGRAFT;  Surgeon: Albertine Patricia, DPM;  Location: [facility] ORS;  Service: Podiatry;  Laterality: Left;  necrotic,gangrene  . LOWER EXTREMITY ANGIOGRAPHY Left 08/16/2016   Procedure: Lower Extremity Angiography;  Surgeon: Algernon Huxley, MD;  Location: [place] [facility] LAB;  Service: Cardiovascular;  Laterality: Left;  . LOWER EXTREMITY ANGIOGRAPHY Left 09/01/2016   Procedure: Lower Extremity Angiography;  Surgeon: Algernon Huxley, MD;  Location: [place] [facility];  Service: Cardiovascular;  Laterality: Left;  . LOWER EXTREMITY ANGIOGRAPHY Left 10/14/2016   Procedure: Lower Extremity Angiography;  Surgeon: Algernon Huxley, MD;  Location: [place] [facility] LAB;  Service: Cardiovascular;  Laterality: Left;  . LOWER EXTREMITY INTERVENTION  09/01/2016   Procedure: Lower Extremity Intervention;  Surgeon: Algernon Huxley, MD;  Location: [place] [facility] LAB;  Service: Cardiovascular;;  . PROSTATE SURGERY     removal  . TONSILLECTOMY     as a child    Allergies Contrast media [iodinated diagnostic agents]; Iohexol; and Latex  Social History Social History  Substance Use Topics  . Smoking status: Former Smoker    Packs/day: 0.25    Types: Cigarettes     Quit date: 10/14/1994  . Smokeless tobacco: Never Used  . Alcohol use No    Review of Systems Constitutional: Negative for fever. Eyes: Negative for vision changes ENT:  Negative for congestion, sore throat Cardiovascular: Negative for chest pain. Respiratory: Negative for shortness of breath. Gastrointestinal: Negative for abdominal pain, vomiting and diarrhea. Genitourinary: Negative for dysuria. Musculoskeletal: Positive for left foot pain Skin: Positive for left foot erythema Neurological: Negative for headaches, Positive for balance disturbance and weakness  All systems negative/normal/unremarkable except as stated in the HPI  ____________________________________________   PHYSICAL EXAM:  VITAL SIGNS: ED Triage Vitals  Enc Vitals Group     BP 10/21/16 1741 (!) 147/97     Pulse Rate 10/21/16 1741 (!) 59     Resp 10/21/16 1741 18     Temp 10/21/16 1741 98.2 F (36.8 C)     Temp Source 10/21/16 1741 Oral     SpO2 10/21/16 1741 99 %     Weight 10/21/16 1737 190 lb (86.2 kg)     Height 10/21/16 1737 5\' 7"  (1.702 m)     Head Circumference --      Peak Flow --      Pain Score --      Pain Loc --      Pain Edu? --      Excl. in [place]? --     Constitutional: Alert and oriented. Well appearing and in no distress. Eyes: Conjunctivae are normal. Normal extraocular movements. ENT   Head: Normocephalic and atraumatic.   Nose: No congestion/rhinnorhea.   Mouth/Throat: Mucous membranes are moist.   Neck: No stridor. Cardiovascular: Normal rate, regular rhythm. No murmurs, rubs, or gallops. Respiratory: Normal respiratory effort without tachypnea nor retractions. Breath sounds are clear and equal bilaterally. No wheezes/rales/rhonchi. Gastrointestinal: Soft and nontender. Normal bowel sounds Musculoskeletal: Patient does have some left foot tenderness. Overall there appears to be erythema around the left foot and surgical incision site. No frank drainage is  appreciated from the wound. Left toes 3 through 5 have been amputated Neurologic:  Normal speech and language. No gross focal neurologic deficits are appreciated. Cranial nerves, strength and sensation appear to be normal Skin:  Erythema is appreciated and around the left foot, extending from the recent surgical site laterally. Psychiatric: Mood and affect are normal. Speech and behavior are normal.  ___________________________________________  ED COURSE:  Pertinent labs & imaging results that were available during my care of the patient were reviewed by me and considered in my medical decision making (see chart for details). Patient presents for weakness, frequent falls and recent foot surgery, we will assess with labs and imaging as indicated.   Procedures ____________________________________________   LABS (pertinent positives/negatives)  Labs Reviewed  CBC WITH DIFFERENTIAL/PLATELET - Abnormal; Notable for the following:       Result Value   WBC 15.2 (*)    RDW 15.1 (*)    Neutro Abs 11.1 (*)    Monocytes Absolute 1.6 (*)    All other components within normal limits  COMPREHENSIVE METABOLIC PANEL - Abnormal; Notable for the following:  Chloride 99 (*)    Glucose, Bld 162 (*)    BUN 29 (*)    Calcium 8.7 (*)    Albumin 3.4 (*)    AST 64 (*)    ALT 93 (*)    Alkaline Phosphatase 371 (*)    GFR calc non Af Amer 57 (*)    All other components within normal limits  TROPONIN I  URINALYSIS, COMPLETE (UACMP) WITH MICROSCOPIC  CBG MONITORING, ED    RADIOLOGY Images were viewed by me  CT head, left foot x-rays IMPRESSION: Status post 3 toe amputation. No acute findings. IMPRESSION: No acute abnormality. Mild to moderate diffuse cerebral atrophy and minimal chronic small vessel white matter ischemic changes in both cerebral hemispheres. ____________________________________________  FINAL ASSESSMENT AND PLAN  Ataxia, cellulitis  Plan: Patient's labs and imaging  were dictated above. Patient had presented for frequent falls and possible need for placement after his toe irritation. He does have some evidence of infection so we will initiate IV antibiotics and talked to the hospitalist for admission. He will also need physical therapy consult and possible social work.   Earleen Newport, MD   Note: This note was generated in part or whole with voice recognition software. Voice recognition is usually quite accurate but there are transcription errors that can and very often do occur. I apologize for any typographical errors that were not detected and corrected.     Earleen Newport, MD 10/21/16 2122

## 2016-10-21 NOTE — ED Notes (Signed)
Floor nurse stated that they needed to get him a low bed and would call ED directly when they got that bed for him

## 2016-10-21 NOTE — ED Triage Notes (Addendum)
Pt comes from home with c/o of fall. Pt states he lost his balance today and fell. Pt denies LOC. Pt denies any N/V or dizziness. Pt states he is not hurting anywhere. Pt had recent toe surgery  on left foot Friday with the removal of 3 toes and states he needs rehabilitation to help him get around.

## 2016-10-21 NOTE — H&P (Signed)
[place] [facility] [place] NAME: [name]    MR#:  [id number]  DATE OF BIRTH:  01-16-1935  DATE OF ADMISSION:  10/21/2016  PRIMARY CARE PHYSICIAN: System, Provider Not In   REQUESTING/REFERRING PHYSICIAN: Jimmye Norman, MD  CHIEF COMPLAINT:   Chief Complaint  Patient presents with  . Fall    HISTORY OF PRESENT ILLNESS:  [name]  is a 82 y.o. male who presents with Multiple falls. Patient states she's been having difficulty with his balance since amputation of his third fourth and fifth toes on his left foot. His foot is extremely tender, but the patient states that he is even had difficulty with his balance even when using a walker. He has had multiple falls. Hospitalists were called for admission and further evaluation and treatment.  PAST MEDICAL HISTORY:   Past Medical History:  Diagnosis Date  . Arthritis   . Atrial fibrillation ([place])   . Carcinoma of prostate ([place])   . CHF (congestive heart failure) ([place])   . Chronic kidney disease   . Diabetes mellitus without complication ([place])   . ED (erectile dysfunction)   . Frequent urination   . Hyperlipidemia   . Hypertension   . Moderate mitral insufficiency   . Peripheral vascular disease ([place])   . Pneumonia 04/2016  . Prostate cancer ([place])   . Sleep apnea    OSA--USE C-PAP  . Ulcer of left foot due to type 2 diabetes mellitus ([place])   . Urinary stress incontinence, male     PAST SURGICAL HISTORY:   Past Surgical History:  Procedure Laterality Date  . APLIGRAFT PLACEMENT Left 10/15/2016   Procedure: APLIGRAFT PLACEMENT;  Surgeon: Albertine Patricia, DPM;  Location: [facility] ORS;  Service: Podiatry;  Laterality: Left;  necrotic ulcer  . CHOLECYSTECTOMY    . EYE SURGERY Bilateral    Cataract Extraction with IOL  . IRRIGATION AND DEBRIDEMENT FOOT Left 10/15/2016   Procedure: IRRIGATION AND DEBRIDEMENT FOOT-EXCISIONAL DEBRIDEMENT OF SKIN 3RD, 4TH AND 5TH TOES WITH  APPLICATION OF APLIGRAFT;  Surgeon: Albertine Patricia, DPM;  Location: [facility] ORS;  Service: Podiatry;  Laterality: Left;  necrotic,gangrene  . LOWER EXTREMITY ANGIOGRAPHY Left 08/16/2016   Procedure: Lower Extremity Angiography;  Surgeon: Algernon Huxley, MD;  Location: [place] [facility];  Service: Cardiovascular;  Laterality: Left;  . LOWER EXTREMITY ANGIOGRAPHY Left 09/01/2016   Procedure: Lower Extremity Angiography;  Surgeon: Algernon Huxley, MD;  Location: [place] [facility] LAB;  Service: Cardiovascular;  Laterality: Left;  . LOWER EXTREMITY ANGIOGRAPHY Left 10/14/2016   Procedure: Lower Extremity Angiography;  Surgeon: Algernon Huxley, MD;  Location: [place] [facility];  Service: Cardiovascular;  Laterality: Left;  . LOWER EXTREMITY INTERVENTION  09/01/2016   Procedure: Lower Extremity Intervention;  Surgeon: Algernon Huxley, MD;  Location: [place] [facility] LAB;  Service: Cardiovascular;;  . PROSTATE SURGERY     removal  . TONSILLECTOMY     as a child    SOCIAL HISTORY:   Social History  Substance Use Topics  . Smoking status: Former Smoker    Packs/day: 0.25    Types: Cigarettes    Quit date: 10/14/1994  . Smokeless tobacco: Never Used  . Alcohol use No    FAMILY HISTORY:   Family History  Problem Relation Age of Onset  . Hypertension Mother   . Prostate cancer Neg Hx   . Bladder Cancer Neg Hx   . Kidney disease Neg Hx  DRUG ALLERGIES:   Allergies  Allergen Reactions  . Contrast Media [Iodinated Diagnostic Agents] Shortness Of Breath    SOB  . Iohexol      Desc: Respiratory Distress, Laryngedema, diaphoresis, Onset Date: [id number]   . Latex Rash    MEDICATIONS AT HOME:   Prior to Admission medications   Medication Sig Start Date End Date Taking? Authorizing Provider  aspirin EC 81 MG tablet Take 1 tablet (81 mg total) by mouth daily. 08/16/16  Yes Dew, Erskine Squibb, MD  atorvastatin (LIPITOR) 80 MG tablet Take 80 mg by mouth daily at 6 PM.    Yes [provider]   diltiazem (CARDIZEM CD) 300 MG 24 hr capsule Take 300 mg by mouth daily.   Yes [provider]  furosemide (LASIX) 20 MG tablet TAKE 1 TABLET DAILY 10/24/15  Yes [provider]  gabapentin (NEURONTIN) 100 MG capsule Take 200 mg by mouth 3 (three) times daily.   Yes [provider]  insulin glargine (LANTUS) 100 UNIT/ML injection Inject 0.24 mLs (24 Units total) into the skin at bedtime. 09/06/16  Yes Theodoro Grist, MD  metFORMIN (GLUCOPHAGE) 1000 MG tablet TAKE 1 TABLET TWICE A DAY WITH MEALS 11/28/15  Yes [provider]  metoprolol (LOPRESSOR) 100 MG tablet Take 1 tablet (100 mg total) by mouth 2 (two) times daily. 09/06/16  Yes Theodoro Grist, MD  oxyCODONE-acetaminophen (PERCOCET) 7.5-325 MG tablet Take 1 tablet by mouth every 4 (four) hours as needed for severe pain. 10/15/16  Yes Troxler, Rodman Key, DPM  polyethylene glycol (MIRALAX / GLYCOLAX) packet Take 17 g by mouth daily as needed for mild constipation. 09/06/16  Yes Theodoro Grist, MD  warfarin (COUMADIN) 5 MG tablet Take 5 mg by mouth at bedtime.    Yes [provider]    REVIEW OF SYSTEMS:  Review of Systems  Constitutional: Negative for chills, fever, malaise/fatigue and weight loss.  HENT: Negative for ear pain, hearing loss and tinnitus.   Eyes: Negative for blurred vision, double vision, pain and redness.  Respiratory: Negative for cough, hemoptysis and shortness of breath.   Cardiovascular: Negative for chest pain, palpitations, orthopnea and leg swelling.  Gastrointestinal: Negative for abdominal pain, constipation, diarrhea, nausea and vomiting.  Genitourinary: Negative for dysuria, frequency and hematuria.  Musculoskeletal: Positive for falls. Negative for back pain, joint pain and neck pain.  Skin:       Left foot erythema  Neurological: Negative for dizziness, tremors, focal weakness and weakness.  Endo/Heme/Allergies: Negative for polydipsia. Does not bruise/bleed easily.   Psychiatric/Behavioral: Negative for depression. The patient is not nervous/anxious and does not have insomnia.      VITAL SIGNS:   Vitals:   10/21/16 1737 10/21/16 1741 10/21/16 2131  BP:  (!) 147/97 132/85  Pulse:  (!) 59 96  Resp:  18 16  Temp:  98.2 F (36.8 C)   TempSrc:  Oral   SpO2:  99% 98%  Weight: 86.2 kg (190 lb)    Height: 5\' 7"  (1.702 m)     Wt Readings from Last 3 Encounters:  10/21/16 86.2 kg (190 lb)  10/15/16 90.7 kg (200 lb)  10/14/16 90.7 kg (200 lb)    PHYSICAL EXAMINATION:  Physical Exam  Vitals reviewed. Constitutional: He is oriented to person, place, and time. He appears well-developed and well-nourished. No distress.  HENT:  Head: Normocephalic and atraumatic.  Mouth/Throat: Oropharynx is clear and moist.  Eyes: Conjunctivae and EOM are normal. Pupils are equal, round, and reactive to  light. No scleral icterus.  Neck: Normal range of motion. Neck supple. No JVD present. No thyromegaly present.  Cardiovascular: Normal rate, regular rhythm and intact distal pulses.  Exam reveals no gallop and no friction rub.   No murmur heard. Respiratory: Effort normal and breath sounds normal. No respiratory distress. He has no wheezes. He has no rales.  GI: Soft. Bowel sounds are normal. He exhibits no distension. There is no tenderness.  Musculoskeletal: Normal range of motion. He exhibits tenderness (left foot). He exhibits no edema.  No arthritis, no gout  Lymphadenopathy:    He has no cervical adenopathy.  Neurological: He is alert and oriented to person, place, and time. No cranial nerve deficit.  Neurologic: Cranial nerves II-XII intact, Sensation intact to light touch/pinprick, 5/5 strength in all extremities, no dysarthria, no aphasia, no dysphagia, memory intact, finger to nose testing showed no abnormality, no pronator drift, gait testing deferred   Skin: Skin is warm and dry. No rash noted. There is erythema (left foot).  Psychiatric: He has a normal  mood and affect. His behavior is normal. Judgment and thought content normal.    LABORATORY PANEL:   CBC  Recent Labs Lab 10/21/16 2028  WBC 15.2*  HGB 13.9  HCT 42.3  PLT 298   ------------------------------------------------------------------------------------------------------------------  Chemistries   Recent Labs Lab 10/21/16 2028  NA 135  K 3.8  CL 99*  CO2 23  GLUCOSE 162*  BUN 29*  CREATININE 1.18  CALCIUM 8.7*  AST 64*  ALT 93*  ALKPHOS 371*  BILITOT 0.8   ------------------------------------------------------------------------------------------------------------------  Cardiac Enzymes  Recent Labs Lab 10/21/16 2028  TROPONINI <0.03   ------------------------------------------------------------------------------------------------------------------  RADIOLOGY:  Ct Head Wo Contrast  Result Date: 10/21/2016 CLINICAL DATA:  Ataxia following foot surgery resulting in a fall today. EXAM: CT HEAD WITHOUT CONTRAST TECHNIQUE: Contiguous axial images were obtained from the base of the skull through the vertex without intravenous contrast. COMPARISON:  None. FINDINGS: Brain: Diffusely enlarged ventricles and subarachnoid spaces. Patchy white matter low density in both cerebral hemispheres. No intracranial hemorrhage, mass lesion or CT evidence of acute infarction. Vascular: No hyperdense vessel or unexpected calcification. Skull: Normal. Negative for fracture or focal lesion. Sinuses/Orbits: Unremarkable. Other: None. IMPRESSION: No acute abnormality. Mild to moderate diffuse cerebral atrophy and minimal chronic small vessel white matter ischemic changes in both cerebral hemispheres. Electronically Signed   By: Claudie Revering M.D.   On: 10/21/2016 20:20   Dg Foot Complete Left  Result Date: 10/21/2016 CLINICAL DATA:  Patient lost balance and fell today. Recent foot surgery. EXAM: LEFT FOOT - COMPLETE 3+ VIEW COMPARISON:  None. FINDINGS: Three-view exam of the left  foot shows amputation of the third, fourth, and fifth toes. No fracture. No subluxation or dislocation. Vascular calcification suggest diabetes. IMPRESSION: Status post 3 toe amputation.  No acute findings. Electronically Signed   By: Misty Stanley M.D.   On: 10/21/2016 20:43    EKG:   Orders placed or performed during the hospital encounter of 10/12/16  . EKG 12-Lead  . EKG 12-Lead    IMPRESSION AND PLAN:  Principal Problem:   Ataxia - We will treat his cellulitis as below, however there is some suspicion for neurological component to his ataxia. We will get a workup with imaging and neurology consult Active Problems:   Cellulitis - IV antibiotics   Type 2 diabetes mellitus treated with insulin (HCC) - sliding scale insulin with corresponding glucose checks   Essential hypertension - continue home meds  PAD (peripheral artery disease) ([place]) - continue home meds including anticoagulation   A-fib ([place]) - continue home rate controlling medications and anticoagulation  All the records are reviewed and case discussed with ED provider. Management plans discussed with the patient and/or family.  DVT PROPHYLAXIS: Systemic anticoagulation  GI PROPHYLAXIS: None  ADMISSION STATUS: Observation  CODE STATUS: Full Code Status History    Date Active Date Inactive Code Status Order ID Comments User Context   10/14/2016 12:01 PM 10/14/2016  7:25 PM Full Code [id number]  Algernon Huxley, MD Inpatient   08/30/2016  1:38 PM 09/06/2016  7:41 PM Full Code [id number]  Epifanio Lesches, MD Inpatient   05/19/2016  4:54 AM 05/22/2016  7:07 PM Full Code [id number]  Saundra Shelling, MD Inpatient      TOTAL TIME TAKING CARE OF THIS PATIENT: 40 minutes.   Katalina Magri [place] 10/21/2016, 10:49 PM  [facility]  Office  [phone]  CC: Primary care physician; System, Provider Not In  Note:  This document was prepared using Dragon voice recognition software and may include unintentional  dictation errors.

## 2016-10-22 ENCOUNTER — Observation Stay: Payer: Medicare Other

## 2016-10-22 ENCOUNTER — Encounter: Payer: Self-pay | Admitting: Vascular Surgery

## 2016-10-22 DIAGNOSIS — R270 Ataxia, unspecified: Secondary | ICD-10-CM | POA: Diagnosis not present

## 2016-10-22 DIAGNOSIS — E1151 Type 2 diabetes mellitus with diabetic peripheral angiopathy without gangrene: Secondary | ICD-10-CM

## 2016-10-22 DIAGNOSIS — M79605 Pain in left leg: Secondary | ICD-10-CM | POA: Diagnosis not present

## 2016-10-22 DIAGNOSIS — I4891 Unspecified atrial fibrillation: Secondary | ICD-10-CM

## 2016-10-22 LAB — LACTATE DEHYDROGENASE: LDH: 172 U/L (ref 98–192)

## 2016-10-22 LAB — GLUCOSE, CAPILLARY
GLUCOSE-CAPILLARY: 86 mg/dL (ref 65–99)
GLUCOSE-CAPILLARY: 133 mg/dL — AB (ref 65–99)
GLUCOSE-CAPILLARY: 115 mg/dL — AB (ref 65–99)
Glucose-Capillary: 141 mg/dL — ABNORMAL HIGH (ref 65–99)
Glucose-Capillary: 145 mg/dL — ABNORMAL HIGH (ref 65–99)

## 2016-10-22 LAB — LIPID PANEL
Cholesterol: 100 mg/dL (ref 0–200)
HDL: 28 mg/dL — ABNORMAL LOW (ref >40)
LDL CALC: 56 mg/dL (ref 0–99)
TRIGLYCERIDES: 78 mg/dL (ref <150)
Total CHOL/HDL Ratio: 3.6 RATIO
VLDL: 16 mg/dL (ref 0–40)

## 2016-10-22 LAB — PROTIME-INR
INR: 5.47
PROTHROMBIN TIME: 51.4 s — AB (ref 11.4–15.2)

## 2016-10-22 LAB — LACTIC ACID, PLASMA: Lactic Acid, Venous: 1.4 mmol/L (ref 0.5–1.9)

## 2016-10-22 LAB — CK: CK TOTAL: 131 U/L (ref 49–397)

## 2016-10-22 MED ORDER — ACETAMINOPHEN 160 MG/5ML PO SOLN: 650.0000 mg | ORAL | Status: DC | PRN

## 2016-10-22 MED ORDER — DILTIAZEM HCL 60 MG PO TABS
60.0000 mg | ORAL_TABLET | Freq: Four times a day (QID) | ORAL | Status: DC | PRN
  Administered 2016-10-23: 60 mg via ORAL
  Filled 2016-10-22 (×2): qty 1

## 2016-10-22 MED ORDER — INSULIN ASPART 100 UNIT/ML ~~LOC~~ SOLN
0.0000 [IU] | Freq: Every day | SUBCUTANEOUS | Status: DC
  Administered 2016-10-23: 2 [IU] via SUBCUTANEOUS
  Filled 2016-10-22: qty 2

## 2016-10-22 MED ORDER — MORPHINE SULFATE (PF) 2 MG/ML IV SOLN
2.0000 mg | INTRAVENOUS | Status: DC | PRN
  Administered 2016-10-22 (×2): 2 mg via INTRAVENOUS
  Filled 2016-10-22 (×2): qty 1

## 2016-10-22 MED ORDER — ACETAMINOPHEN 325 MG PO TABS
650.0000 mg | ORAL_TABLET | ORAL | Status: DC | PRN
  Administered 2016-10-24: 650 mg via ORAL
  Filled 2016-10-22: qty 2

## 2016-10-22 MED ORDER — FUROSEMIDE 20 MG PO TABS
20.0000 mg | ORAL_TABLET | Freq: Every day | ORAL | Status: DC
  Administered 2016-10-22 – 2016-10-24 (×3): 20 mg via ORAL
  Filled 2016-10-22 (×3): qty 1

## 2016-10-22 MED ORDER — DOCUSATE SODIUM 100 MG PO CAPS
100.0000 mg | ORAL_CAPSULE | Freq: Two times a day (BID) | ORAL | Status: DC
  Administered 2016-10-22 – 2016-10-24 (×4): 100 mg via ORAL
  Filled 2016-10-22 (×4): qty 1

## 2016-10-22 MED ORDER — IOPAMIDOL (ISOVUE-300) INJECTION 61%
INTRAVENOUS | Status: DC | PRN
  Administered 2016-10-14: 40 mL via INTRA_ARTERIAL

## 2016-10-22 MED ORDER — ASPIRIN EC 81 MG PO TBEC
81.0000 mg | DELAYED_RELEASE_TABLET | Freq: Every day | ORAL | Status: DC
  Administered 2016-10-22 – 2016-10-24 (×3): 81 mg via ORAL
  Filled 2016-10-22 (×3): qty 1

## 2016-10-22 MED ORDER — DILTIAZEM HCL ER COATED BEADS 180 MG PO CP24
300.0000 mg | ORAL_CAPSULE | Freq: Every day | ORAL | Status: DC
  Administered 2016-10-22 – 2016-10-24 (×3): 300 mg via ORAL
  Filled 2016-10-22 (×3): qty 1

## 2016-10-22 MED ORDER — TRAMADOL HCL 50 MG PO TABS
50.0000 mg | ORAL_TABLET | Freq: Four times a day (QID) | ORAL | Status: DC | PRN
  Administered 2016-10-23 – 2016-10-24 (×3): 50 mg via ORAL
  Filled 2016-10-22 (×4): qty 1

## 2016-10-22 MED ORDER — METOPROLOL TARTRATE 50 MG PO TABS
100.0000 mg | ORAL_TABLET | Freq: Two times a day (BID) | ORAL | Status: DC
  Administered 2016-10-22 – 2016-10-24 (×6): 100 mg via ORAL
  Filled 2016-10-22 (×6): qty 2

## 2016-10-22 MED ORDER — GADOBENATE DIMEGLUMINE 529 MG/ML IV SOLN
20.0000 mL | Freq: Once | INTRAVENOUS | Status: AC | PRN
  Administered 2016-10-22: 13:00:00 18 mL via INTRAVENOUS

## 2016-10-22 MED ORDER — INSULIN ASPART 100 UNIT/ML ~~LOC~~ SOLN
0.0000 [IU] | Freq: Three times a day (TID) | SUBCUTANEOUS | Status: DC
  Administered 2016-10-22 – 2016-10-23 (×3): 1 [IU] via SUBCUTANEOUS
  Administered 2016-10-24 (×2): 3 [IU] via SUBCUTANEOUS
  Filled 2016-10-22: qty 1
  Filled 2016-10-22 (×2): qty 3
  Filled 2016-10-22 (×2): qty 1

## 2016-10-22 MED ORDER — ATORVASTATIN CALCIUM 20 MG PO TABS
80.0000 mg | ORAL_TABLET | Freq: Every day | ORAL | Status: DC
  Administered 2016-10-22 – 2016-10-23 (×2): 80 mg via ORAL
  Filled 2016-10-22 (×2): qty 4

## 2016-10-22 MED ORDER — GABAPENTIN 100 MG PO CAPS
200.0000 mg | ORAL_CAPSULE | Freq: Three times a day (TID) | ORAL | Status: DC
  Administered 2016-10-22 – 2016-10-24 (×7): 200 mg via ORAL
  Filled 2016-10-22 (×7): qty 2

## 2016-10-22 MED ORDER — STROKE: EARLY STAGES OF RECOVERY BOOK
Freq: Once | Status: AC
  Administered 2016-10-22: 02:00:00

## 2016-10-22 MED ORDER — VANCOMYCIN HCL IN DEXTROSE 1-5 GM/200ML-% IV SOLN
1000.0000 mg | INTRAVENOUS | Status: DC
  Administered 2016-10-22 – 2016-10-23 (×3): 1000 mg via INTRAVENOUS
  Filled 2016-10-22 (×4): qty 200

## 2016-10-22 MED ORDER — OXYCODONE-ACETAMINOPHEN 7.5-325 MG PO TABS
1.0000 | ORAL_TABLET | ORAL | Status: DC | PRN
  Administered 2016-10-22 – 2016-10-23 (×3): 1 via ORAL
  Filled 2016-10-22 (×6): qty 1

## 2016-10-22 MED ORDER — ACETAMINOPHEN 650 MG RE SUPP: 650.0000 mg | RECTAL | Status: DC | PRN

## 2016-10-22 NOTE — Progress Notes (Signed)
[facility] NAME: [name]    MR#:  [id number]  DATE OF BIRTH:  July 25, 1934  SUBJECTIVE:  CHIEF COMPLAINT:   Chief Complaint  Patient presents with  . Fall   The patient is 82 year old male with past medical history significant for history of peripheral artery disease, status post angiogram 3, revascularization of the left leg, most recently 24th of May 2018, patient did have 2 to somebody 25th of May 2018 by Dr. Elvina Mattes. Perioperatively, patient's Coumadin was stopped. He presents to the hospital with complaints of left lower extremity weakness and pain, falls, ataxia. MRI of the brain showed no acute stroke, chronically occluded left vertebral artery was noted, however, carotids, as well as right vertebral artery were patent. Patient complains of significant left lower extremity pain and thigh, calf. Left lower extremity Doppler ultrasound was negative for DVT.    Review of Systems  Constitutional: Negative for chills, fever and weight loss.  HENT: Negative for congestion.   Eyes: Negative for blurred vision and double vision.  Respiratory: Negative for cough, sputum production, shortness of breath and wheezing.   Cardiovascular: Negative for chest pain, palpitations, orthopnea, leg swelling and PND.  Gastrointestinal: Negative for abdominal pain, blood in stool, constipation, diarrhea, nausea and vomiting.  Genitourinary: Negative for dysuria, frequency, hematuria and urgency.  Musculoskeletal: Positive for myalgias. Negative for falls.  Neurological: Positive for focal weakness. Negative for dizziness, tremors and headaches.  Endo/Heme/Allergies: Does not bruise/bleed easily.  Psychiatric/Behavioral: Negative for depression. The patient does not have insomnia.     VITAL SIGNS: Blood pressure (!) 143/65, pulse 70, temperature 98.2 F (36.8 C), temperature source Oral, resp. rate (!) 22, height 5\' 7"  (1.702 m),  weight 86.2 kg (190 lb), SpO2 97 %.  PHYSICAL EXAMINATION:   GENERAL:  82 y.o.-year-old patient lying in the bed with no acute distress.  EYES: Pupils equal, round, reactive to light and accommodation. No scleral icterus. Extraocular muscles intact.  HEENT: Head atraumatic, normocephalic. Oropharynx and nasopharynx clear.  NECK:  Supple, no jugular venous distention. No thyroid enlargement, no tenderness.  LUNGS: Normal breath sounds bilaterally, no wheezing, rales,rhonchi or crepitation. No use of accessory muscles of respiration.  CARDIOVASCULAR: S1, S2 normal. No murmurs, rubs, or gallops.  ABDOMEN: Soft, nontender, nondistended. Bowel sounds present. No organomegaly or mass.  EXTREMITIES: No pedal edema, cyanosis, or clubbing. Significant left lower extremity pain on muscle palpation including thigh as well as, skin was warm, peripheral Doppler pulses are palpable on the left, per nursing staff NEUROLOGIC: Cranial nerves II through XII are intact. Muscle strength 5/5 in all extremities. Sensation intact. Gait not checked.  PSYCHIATRIC: The patient is alert and oriented x 3.  SKIN: No obvious rash, lesion, or ulcer.   ORDERS/RESULTS REVIEWED:   CBC  Recent Labs Lab 10/21/16 2028  WBC 15.2*  HGB 13.9  HCT 42.3  PLT 298  MCV 85.7  MCH 28.1  MCHC 32.8  RDW 15.1*  LYMPHSABS 1.8  MONOABS 1.6*  EOSABS 0.5  BASOSABS 0.1   ------------------------------------------------------------------------------------------------------------------  Chemistries   Recent Labs Lab 10/21/16 2028  NA 135  K 3.8  CL 99*  CO2 23  GLUCOSE 162*  BUN 29*  CREATININE 1.18  CALCIUM 8.7*  AST 64*  ALT 93*  ALKPHOS 371*  BILITOT 0.8   ------------------------------------------------------------------------------------------------------------------ estimated creatinine clearance is 53.2 mL/min (by C-G formula based on SCr of 1.18  mg/dL). ------------------------------------------------------------------------------------------------------------------ No results for input(s): TSH, T4TOTAL,  T3FREE, THYROIDAB in the last 72 hours.  Invalid input(s): FREET3  Cardiac Enzymes  Recent Labs Lab 10/21/16 2028  TROPONINI <0.03   ------------------------------------------------------------------------------------------------------------------ Invalid input(s): POCBNP ---------------------------------------------------------------------------------------------------------------  RADIOLOGY: Ct Head Wo Contrast  Result Date: 10/21/2016 CLINICAL DATA:  Ataxia following foot surgery resulting in a fall today. EXAM: CT HEAD WITHOUT CONTRAST TECHNIQUE: Contiguous axial images were obtained from the base of the skull through the vertex without intravenous contrast. COMPARISON:  None. FINDINGS: Brain: Diffusely enlarged ventricles and subarachnoid spaces. Patchy white matter low density in both cerebral hemispheres. No intracranial hemorrhage, mass lesion or CT evidence of acute infarction. Vascular: No hyperdense vessel or unexpected calcification. Skull: Normal. Negative for fracture or focal lesion. Sinuses/Orbits: Unremarkable. Other: None. IMPRESSION: No acute abnormality. Mild to moderate diffuse cerebral atrophy and minimal chronic small vessel white matter ischemic changes in both cerebral hemispheres. Electronically Signed   By: Claudie Revering M.D.   On: 10/21/2016 20:20   Mr [name] Neck W Wo Contrast  Result Date: 10/22/2016 CLINICAL DATA:  Initial evaluation for ataxia, multiple falls. EXAM: MRI HEAD WITHOUT CONTRAST MRA HEAD WITHOUT CONTRAST MRA NECK WITHOUT AND WITH CONTRAST TECHNIQUE: Multiplanar, multiecho pulse sequences of the brain and surrounding structures were obtained without intravenous contrast. Angiographic images of the Circle of Willis were obtained using MRA technique without intravenous contrast. Angiographic images  of the neck were obtained using MRA technique without and with intravenous contrast. Carotid stenosis measurements (when applicable) are obtained utilizing NASCET criteria, using the distal internal carotid diameter as the denominator. CONTRAST:  32mL MULTIHANCE GADOBENATE DIMEGLUMINE 529 MG/ML IV SOLN COMPARISON:  Prior CT from 10/21/2016. FINDINGS: MRI HEAD FINDINGS Diffuse prominence of the CSF containing spaces is compatible with generalized age related cerebral atrophy. Patchy and confluent T2/FLAIR hyperintensity within the periventricular and deep white matter both cerebral hemispheres most consistent with chronic small vessel ischemic disease, mild to moderate nature. Chronic microvascular changes present within the pons. The no abnormal foci of restricted diffusion to suggest acute or subacute ischemia. Gray-white matter differentiation maintained. No encephalomalacia to suggest chronic infarction. No evidence for acute or chronic intracranial hemorrhage. No mass lesion, midline shift or mass effect. Ventricles normal size without evidence for hydrocephalus. No extra-axial fluid collection. Major dural sinuses are grossly patent. Pituitary gland within normal limits. Midline structures intact and normal. Major intracranial vascular flow voids are maintained. Vertebrobasilar system is diminutive. Mild degenerative thickening noted at the tectorial membrane without significant stenosis. Craniocervical junction otherwise unremarkable. Visualized upper cervical spine within normal limits. Bone marrow signal intensity normal. No scalp soft tissue abnormality. Globes normal soft tissues within normal limits. Patient status post lens extraction bilaterally. Paranasal sinuses and mastoid air cells are clear. Inner ear structures normal. MRA HEAD FINDINGS ANTERIOR CIRCULATION: Study mildly degraded by motion artifact. The Distal cervical segments of the internal carotid arteries are widely patent with antegrade  flow. Petrous, cavernous, and supraclinoid segments patent without flow-limiting stenosis. ICA termini widely patent. A1 segments patent bilaterally. Anterior cerebral arteries patent to their distal aspects without stenosis. M1 segments patent without stenosis or occlusion. MCA bifurcations normal. No proximal M2 occlusion. Distal MCA branches demonstrate mild small vessel atheromatous irregularity but are well opacified and fairly symmetric. POSTERIOR CIRCULATION: Vertebral arteries are diminutive, with dominant right vertebral artery. Minimal atheromatous irregularity within the right vertebral artery without flow-limiting stenosis. Left V4 segment is hypoplastic and somewhat attenuated with atheromatous moderate irregularity, but is opacified to the vertebrobasilar junction. Posterior inferior cerebral arteries are patent proximally. Basilar  artery diminutive with multifocal atheromatous irregularity with moderate to severe multifocal stenoses at its mid and distal aspect (series 109, image 9). Superior cerebral arteries are patent bilaterally. Fetal type origin of the PCAs supplied via widely patent posterior communicating arteries. Small hypoplastic left P1 segment noted. PCAs grossly patent to their distal aspects, although not well evaluated due to motion artifact. No aneurysm or other vascular malformation. MRA NECK FINDINGS Visualized aortic arch of normal caliber with normal branch pattern. No high-grade stenosis about the origin of the great vessels. Visualized subclavian arteries widely patent. Right common and internal carotid arteries are widely patent without high-grade or flow-limiting stenosis. Mild atheromatous irregularity about the right carotid bifurcation without significant stenosis. Left common internal carotid arteries are widely patent without high-grade or flow-limiting stenosis. Mild atheromatous irregularity about the left carotid bifurcation without significant stenosis. Dominant right  vertebral artery rises from the right subclavian artery. Origin of the right vertebral artery not well evaluated on this exam, with possible superimposed stenosis not excluded (series 100, image 6). Right V1 and V2 segments are widely patent distally. Right V3 segment not well evaluated, but appears grossly patent on source imaging. Left vertebral artery occluded just distal to its origin, and remains occluded within the neck. Visible flow within the intracranial left V4 segment as detailed on the MRA head portion of this exam. IMPRESSION: MRI HEAD IMPRESSION: 1. No acute intracranial process identified. 2. Generalized age-related cerebral atrophy with mild to moderate chronic small vessel ischemic disease. MRA HEAD IMPRESSION: 1. Fetal type origin of the PCAs with secondary diminutive vertebrobasilar system. 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 Left vertebral artery is occluded within the neck (see below). Intracranial left V4 segment is opacified, which may be via collateralization and/or retrograde flow across the vertebrobasilar junction. Dominant right vertebral artery patent without significant stenosis. A component of vertebrobasilar insufficiency may be contributing to patient's symptoms. 2. Widely patent anterior circulation without stenosis or large vessel occlusion. 3. Distal small vessel atheromatous irregularity. MRA NECK IMPRESSION: 1. Occlusion of the left vertebral artery at its origin, which remains occluded throughout the neck. Dominant right vertebral artery patent without obvious stenosis, although the origin of the right vertebral artery not well evaluated on this exam. 2. Widely patent carotid artery systems bilaterally without flow-limiting stenosis. Electronically Signed   By: Jeannine Boga M.D.   On: 10/22/2016 14:44   Mr Brain Wo Contrast  Result Date: 10/22/2016 CLINICAL DATA:  Initial evaluation for  ataxia, multiple falls. EXAM: MRI HEAD WITHOUT CONTRAST MRA HEAD WITHOUT CONTRAST MRA NECK WITHOUT AND WITH CONTRAST TECHNIQUE: Multiplanar, multiecho pulse sequences of the brain and surrounding structures were obtained without intravenous contrast. Angiographic images of the Circle of Willis were obtained using MRA technique without intravenous contrast. Angiographic images of the neck were obtained using MRA technique without and with intravenous contrast. Carotid stenosis measurements (when applicable) are obtained utilizing NASCET criteria, using the distal internal carotid diameter as the denominator. CONTRAST:  28mL MULTIHANCE GADOBENATE DIMEGLUMINE 529 MG/ML IV SOLN COMPARISON:  Prior CT from 10/21/2016. FINDINGS: MRI HEAD FINDINGS Diffuse prominence of the CSF containing spaces is compatible with generalized age related cerebral atrophy. Patchy and confluent T2/FLAIR hyperintensity within the periventricular and deep white matter both cerebral hemispheres most consistent with chronic small vessel ischemic disease, mild to moderate nature. Chronic microvascular changes present within the pons. The no abnormal foci of restricted diffusion to suggest acute or subacute ischemia. Gray-white matter differentiation maintained.  No encephalomalacia to suggest chronic infarction. No evidence for acute or chronic intracranial hemorrhage. No mass lesion, midline shift or mass effect. Ventricles normal size without evidence for hydrocephalus. No extra-axial fluid collection. Major dural sinuses are grossly patent. Pituitary gland within normal limits. Midline structures intact and normal. Major intracranial vascular flow voids are maintained. Vertebrobasilar system is diminutive. Mild degenerative thickening noted at the tectorial membrane without significant stenosis. Craniocervical junction otherwise unremarkable. Visualized upper cervical spine within normal limits. Bone marrow signal intensity normal. No scalp soft  tissue abnormality. Globes normal soft tissues within normal limits. Patient status post lens extraction bilaterally. Paranasal sinuses and mastoid air cells are clear. Inner ear structures normal. MRA HEAD FINDINGS ANTERIOR CIRCULATION: Study mildly degraded by motion artifact. The Distal cervical segments of the internal carotid arteries are widely patent with antegrade flow. Petrous, cavernous, and supraclinoid segments patent without flow-limiting stenosis. ICA termini widely patent. A1 segments patent bilaterally. Anterior cerebral arteries patent to their distal aspects without stenosis. M1 segments patent without stenosis or occlusion. MCA bifurcations normal. No proximal M2 occlusion. Distal MCA branches demonstrate mild small vessel atheromatous irregularity but are well opacified and fairly symmetric. POSTERIOR CIRCULATION: Vertebral arteries are diminutive, with dominant right vertebral artery. Minimal atheromatous irregularity within the right vertebral artery without flow-limiting stenosis. Left V4 segment is hypoplastic and somewhat attenuated with atheromatous moderate irregularity, but is opacified to the vertebrobasilar junction. Posterior inferior cerebral arteries are patent proximally. Basilar artery diminutive with multifocal atheromatous irregularity with moderate to severe multifocal stenoses at its mid and distal aspect (series 109, image 9). Superior cerebral arteries are patent bilaterally. Fetal type origin of the PCAs supplied via widely patent posterior communicating arteries. Small hypoplastic left P1 segment noted. PCAs grossly patent to their distal aspects, although not well evaluated due to motion artifact. No aneurysm or other vascular malformation. MRA NECK FINDINGS Visualized aortic arch of normal caliber with normal branch pattern. No high-grade stenosis about the origin of the great vessels. Visualized subclavian arteries widely patent. Right common and internal carotid  arteries are widely patent without high-grade or flow-limiting stenosis. Mild atheromatous irregularity about the right carotid bifurcation without significant stenosis. Left common internal carotid arteries are widely patent without high-grade or flow-limiting stenosis. Mild atheromatous irregularity about the left carotid bifurcation without significant stenosis. Dominant right vertebral artery rises from the right subclavian artery. Origin of the right vertebral artery not well evaluated on this exam, with possible superimposed stenosis not excluded (series 100, image 6). Right V1 and V2 segments are widely patent distally. Right V3 segment not well evaluated, but appears grossly patent on source imaging. Left vertebral artery occluded just distal to its origin, and remains occluded within the neck. Visible flow within the intracranial left V4 segment as detailed on the MRA head portion of this exam. IMPRESSION: MRI HEAD IMPRESSION: 1. No acute intracranial process identified. 2. Generalized age-related cerebral atrophy with mild to moderate chronic small vessel ischemic disease. MRA HEAD IMPRESSION: 1. Fetal type origin of the PCAs with secondary diminutive vertebrobasilar system. 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 Left vertebral artery is occluded within the neck (see below). Intracranial left V4 segment is opacified, which may be via collateralization and/or retrograde flow across the vertebrobasilar junction. Dominant right vertebral artery patent without significant stenosis. A component of vertebrobasilar insufficiency may be contributing to patient's symptoms. 2. Widely patent anterior circulation without stenosis or large vessel occlusion. 3. Distal small vessel atheromatous irregularity. MRA  NECK IMPRESSION: 1. Occlusion of the left vertebral artery at its origin, which remains occluded throughout the neck. Dominant right  vertebral artery patent without obvious stenosis, although the origin of the right vertebral artery not well evaluated on this exam. 2. Widely patent carotid artery systems bilaterally without flow-limiting stenosis. Electronically Signed   By: Jeannine Boga M.D.   On: 10/22/2016 14:44   US Venous Img Lower Unilateral Left  Result Date: 10/22/2016 CLINICAL DATA:  Left thigh pain for 3 months. EXAM: Left LOWER EXTREMITY VENOUS DOPPLER ULTRASOUND TECHNIQUE: Gray-scale sonography with graded compression, as well as color Doppler and duplex ultrasound were performed to evaluate the lower extremity deep venous systems from the level of the common femoral vein and including the common femoral, femoral, profunda femoral, popliteal and calf veins including the posterior tibial, peroneal and gastrocnemius veins when visible. The superficial great saphenous vein was also interrogated. Spectral Doppler was utilized to evaluate flow at rest and with distal augmentation maneuvers in the common femoral, femoral and popliteal veins. COMPARISON:  None. FINDINGS: Contralateral Common Femoral Vein: Respiratory phasicity is normal and symmetric with the symptomatic side. No evidence of thrombus. Normal compressibility. Common Femoral Vein: No evidence of thrombus. Normal compressibility, respiratory phasicity and response to augmentation. Saphenofemoral Junction: No evidence of thrombus. Normal compressibility and flow on color Doppler imaging. Profunda Femoral Vein: No evidence of thrombus. Normal compressibility and flow on color Doppler imaging. Femoral Vein: No evidence of thrombus. Normal compressibility, respiratory phasicity and response to augmentation. Popliteal Vein: No evidence of thrombus. Normal compressibility, respiratory phasicity and response to augmentation. Calf Veins: No evidence of thrombus. Normal compressibility and flow on color Doppler imaging. Venous Reflux:  None. Other Findings:  None. IMPRESSION:  No evidence of DVT within the left lower extremity. Electronically Signed   By: Marijo Conception, M.D.   On: 10/22/2016 14:25   Dg Foot Complete Left  Result Date: 10/21/2016 CLINICAL DATA:  Patient lost balance and fell today. Recent foot surgery. EXAM: LEFT FOOT - COMPLETE 3+ VIEW COMPARISON:  None. FINDINGS: Three-view exam of the left foot shows amputation of the third, fourth, and fifth toes. No fracture. No subluxation or dislocation. Vascular calcification suggest diabetes. IMPRESSION: Status post 3 toe amputation.  No acute findings. Electronically Signed   By: Misty Stanley M.D.   On: 10/21/2016 20:43   Mr [name] Head/brain ER Cm  Result Date: 10/22/2016 CLINICAL DATA:  Initial evaluation for ataxia, multiple falls. EXAM: MRI HEAD WITHOUT CONTRAST MRA HEAD WITHOUT CONTRAST MRA NECK WITHOUT AND WITH CONTRAST TECHNIQUE: Multiplanar, multiecho pulse sequences of the brain and surrounding structures were obtained without intravenous contrast. Angiographic images of the Circle of Willis were obtained using MRA technique without intravenous contrast. Angiographic images of the neck were obtained using MRA technique without and with intravenous contrast. Carotid stenosis measurements (when applicable) are obtained utilizing NASCET criteria, using the distal internal carotid diameter as the denominator. CONTRAST:  31mL MULTIHANCE GADOBENATE DIMEGLUMINE 529 MG/ML IV SOLN COMPARISON:  Prior CT from 10/21/2016. FINDINGS: MRI HEAD FINDINGS Diffuse prominence of the CSF containing spaces is compatible with generalized age related cerebral atrophy. Patchy and confluent T2/FLAIR hyperintensity within the periventricular and deep white matter both cerebral hemispheres most consistent with chronic small vessel ischemic disease, mild to moderate nature. Chronic microvascular changes present within the pons. The no abnormal foci of restricted diffusion to suggest acute or subacute ischemia. Gray-white matter  differentiation maintained. No encephalomalacia to suggest chronic infarction. No evidence for acute  or chronic intracranial hemorrhage. No mass lesion, midline shift or mass effect. Ventricles normal size without evidence for hydrocephalus. No extra-axial fluid collection. Major dural sinuses are grossly patent. Pituitary gland within normal limits. Midline structures intact and normal. Major intracranial vascular flow voids are maintained. Vertebrobasilar system is diminutive. Mild degenerative thickening noted at the tectorial membrane without significant stenosis. Craniocervical junction otherwise unremarkable. Visualized upper cervical spine within normal limits. Bone marrow signal intensity normal. No scalp soft tissue abnormality. Globes normal soft tissues within normal limits. Patient status post lens extraction bilaterally. Paranasal sinuses and mastoid air cells are clear. Inner ear structures normal. MRA HEAD FINDINGS ANTERIOR CIRCULATION: Study mildly degraded by motion artifact. The Distal cervical segments of the internal carotid arteries are widely patent with antegrade flow. Petrous, cavernous, and supraclinoid segments patent without flow-limiting stenosis. ICA termini widely patent. A1 segments patent bilaterally. Anterior cerebral arteries patent to their distal aspects without stenosis. M1 segments patent without stenosis or occlusion. MCA bifurcations normal. No proximal M2 occlusion. Distal MCA branches demonstrate mild small vessel atheromatous irregularity but are well opacified and fairly symmetric. POSTERIOR CIRCULATION: Vertebral arteries are diminutive, with dominant right vertebral artery. Minimal atheromatous irregularity within the right vertebral artery without flow-limiting stenosis. Left V4 segment is hypoplastic and somewhat attenuated with atheromatous moderate irregularity, but is opacified to the vertebrobasilar junction. Posterior inferior cerebral arteries are patent  proximally. Basilar artery diminutive with multifocal atheromatous irregularity with moderate to severe multifocal stenoses at its mid and distal aspect (series 109, image 9). Superior cerebral arteries are patent bilaterally. Fetal type origin of the PCAs supplied via widely patent posterior communicating arteries. Small hypoplastic left P1 segment noted. PCAs grossly patent to their distal aspects, although not well evaluated due to motion artifact. No aneurysm or other vascular malformation. MRA NECK FINDINGS Visualized aortic arch of normal caliber with normal branch pattern. No high-grade stenosis about the origin of the great vessels. Visualized subclavian arteries widely patent. Right common and internal carotid arteries are widely patent without high-grade or flow-limiting stenosis. Mild atheromatous irregularity about the right carotid bifurcation without significant stenosis. Left common internal carotid arteries are widely patent without high-grade or flow-limiting stenosis. Mild atheromatous irregularity about the left carotid bifurcation without significant stenosis. Dominant right vertebral artery rises from the right subclavian artery. Origin of the right vertebral artery not well evaluated on this exam, with possible superimposed stenosis not excluded (series 100, image 6). Right V1 and V2 segments are widely patent distally. Right V3 segment not well evaluated, but appears grossly patent on source imaging. Left vertebral artery occluded just distal to its origin, and remains occluded within the neck. Visible flow within the intracranial left V4 segment as detailed on the MRA head portion of this exam. IMPRESSION: MRI HEAD IMPRESSION: 1. No acute intracranial process identified. 2. Generalized age-related cerebral atrophy with mild to moderate chronic small vessel ischemic disease. MRA HEAD IMPRESSION: 1. Fetal type origin of the PCAs with secondary diminutive vertebrobasilar system. Atheromatous  irregularity throughout the diminutive basilar artery with moderate to severe multifocal stenoses. Left vertebral artery is occluded within the neck (see below). Intracranial left V4 segment is opacified, which may be via collateralization and/or retrograde flow across the vertebrobasilar junction. Dominant right vertebral artery patent without significant stenosis. A component of vertebrobasilar insufficiency may be contributing to patient's symptoms. 2. Widely patent anterior circulation without stenosis or large vessel occlusion. 3. Distal small vessel atheromatous irregularity. MRA NECK IMPRESSION: 1. Occlusion of the left vertebral artery at  its origin, which remains occluded throughout the neck. Dominant right vertebral artery patent without obvious stenosis, although the origin of the right vertebral artery not well evaluated on this exam. 2. Widely patent carotid artery systems bilaterally without flow-limiting stenosis. Electronically Signed   By: Jeannine Boga M.D.   On: 10/22/2016 14:44    EKG:  Orders placed or performed during the hospital encounter of 10/12/16  . EKG 12-Lead  . EKG 12-Lead    ASSESSMENT AND PLAN:  Principal Problem:   Ataxia Active Problems:   Type 2 diabetes mellitus treated with insulin (HCC)   Essential hypertension   PAD (peripheral artery disease) (HCC)   A-fib (HCC) #1. Ataxia due to left lower extremity weakness and pain, patient was evaluated by physical therapist and recommended skilled nursing facility placement for rehabilitation, likely discharged tomorrow #2. Left lower extremity weakness and pain of unclear etiology at this time, and likely vascular, per vascular surgeon, patient's CK, LDH, lactic acid levels were normal. Suspected radicular, getting neurologist involved for recommendations, getting lumbar MRI.  #3 . Atrial fibrillation, rate is poorly controlled, ranging from 90s to 140s, continue Cardizem CD, add Cardizem when necessary,  continue Coumadin, INR is supratherapeutic, pharmacy to dose #4. Diabetes mellitus, continue outpatient occasions, sliding scale insulin #5. Elevated transaminases of unclear etiology, get ultrasound of the morning, repeat LFTs, suspected related to Lipitor, CK is unremarkable    Management plans discussed with the patient, family and they are in agreement.   DRUG ALLERGIES:  Allergies  Allergen Reactions  . Contrast Media [Iodinated Diagnostic Agents] Shortness Of Breath    SOB  . Iohexol      Desc: Respiratory Distress, Laryngedema, diaphoresis, Onset Date: [id number]   . Latex Rash    CODE STATUS:     Code Status Orders        Start     Ordered   10/22/16 0041  Full code  Continuous     10/22/16 0040    Code Status History    Date Active Date Inactive Code Status Order ID Comments User Context   10/14/2016 12:01 PM 10/14/2016  7:25 PM Full Code [id number]  Algernon Huxley, MD Inpatient   08/30/2016  1:38 PM 09/06/2016  7:41 PM Full Code [id number]  Epifanio Lesches, MD Inpatient   05/19/2016  4:54 AM 05/22/2016  7:07 PM Full Code [id number]  Saundra Shelling, MD Inpatient      TOTAL TIME TAKING CARE OF THIS PATIENT: 40 minutes.    Theodoro Grist M.D on 10/22/2016 at 3:57 PM  Between 7am to 6pm - Pager - [phone]  After 6pm go to [email or web address] - password [email or web address]  [facility]  Office  [phone]  CC: Primary care physician; System, Provider Not In

## 2016-10-22 NOTE — Consult Note (Signed)
Reason for Consult:Gait ataxia Referring Physician: Ether Griffins  CC: Gait ataxia  HPI: [name] is an 82 y.o. male who presents with multiple falls. Patient states he has been having difficulty with his balance since amputation of his third fourth and fifth toes on his left foot. His foot is extremely tender, but the patient states that he is even had difficulty with his balance even when using a walker. He has had multiple falls.   Past Medical History:  Diagnosis Date  . Arthritis   . Atrial fibrillation ([place])   . Carcinoma of prostate ([place])   . CHF (congestive heart failure) ([place])   . Chronic kidney disease   . Diabetes mellitus without complication ([place])   . ED (erectile dysfunction)   . Frequent urination   . Hyperlipidemia   . Hypertension   . Moderate mitral insufficiency   . Peripheral vascular disease ([place])   . Pneumonia 04/2016  . Prostate cancer ([place])   . Sleep apnea    OSA--USE C-PAP  . Ulcer of left foot due to type 2 diabetes mellitus ([place])   . Urinary stress incontinence, male     Past Surgical History:  Procedure Laterality Date  . APLIGRAFT PLACEMENT Left 10/15/2016   Procedure: APLIGRAFT PLACEMENT;  Surgeon: Albertine Patricia, DPM;  Location: [facility];  Service: Podiatry;  Laterality: Left;  necrotic ulcer  . CHOLECYSTECTOMY    . EYE SURGERY Bilateral    Cataract Extraction with IOL  . IRRIGATION AND DEBRIDEMENT FOOT Left 10/15/2016   Procedure: IRRIGATION AND DEBRIDEMENT FOOT-EXCISIONAL DEBRIDEMENT OF SKIN 3RD, 4TH AND 5TH TOES WITH APPLICATION OF APLIGRAFT;  Surgeon: Albertine Patricia, DPM;  Location: [facility];  Service: Podiatry;  Laterality: Left;  necrotic,gangrene  . LOWER EXTREMITY ANGIOGRAPHY Left 08/16/2016   Procedure: Lower Extremity Angiography;  Surgeon: Algernon Huxley, MD;  Location: [place] [facility];  Service: Cardiovascular;  Laterality: Left;  . LOWER EXTREMITY ANGIOGRAPHY Left 09/01/2016   Procedure: Lower Extremity Angiography;  Surgeon:  Algernon Huxley, MD;  Location: [place] [facility];  Service: Cardiovascular;  Laterality: Left;  . LOWER EXTREMITY ANGIOGRAPHY Left 10/14/2016   Procedure: Lower Extremity Angiography;  Surgeon: Algernon Huxley, MD;  Location: [place] [facility];  Service: Cardiovascular;  Laterality: Left;  . LOWER EXTREMITY INTERVENTION  09/01/2016   Procedure: Lower Extremity Intervention;  Surgeon: Algernon Huxley, MD;  Location: [place] [facility];  Service: Cardiovascular;;  . PROSTATE SURGERY     removal  . TONSILLECTOMY     as a child    Family History  Problem Relation Age of Onset  . Hypertension Mother   . Prostate cancer Neg Hx   . Bladder Cancer Neg Hx   . Kidney disease Neg Hx     Social History:  reports that he quit smoking about 22 years ago. His smoking use included Cigarettes. He smoked 0.25 packs per day. He has never used smokeless tobacco. He reports that he does not drink alcohol or use drugs.  Allergies  Allergen Reactions  . Contrast Media [Iodinated Diagnostic Agents] Shortness Of Breath    SOB  . Iohexol      Desc: Respiratory Distress, Laryngedema, diaphoresis, Onset Date: [id number]   . Latex Rash    Medications:  I have reviewed the patient's current medications. Prior to Admission:  Prescriptions Prior to Admission  Medication Sig Dispense Refill Last Dose  . aspirin EC 81 MG tablet Take 1 tablet (81 mg total) by mouth daily.  150 tablet 2 10/21/2016 at Unknown time  . atorvastatin (LIPITOR) 80 MG tablet Take 80 mg by mouth daily at 6 PM.    10/21/2016 at Unknown time  . diltiazem (CARDIZEM CD) 300 MG 24 hr capsule Take 300 mg by mouth daily.   10/21/2016 at Unknown time  . furosemide (LASIX) 20 MG tablet TAKE 1 TABLET DAILY   10/21/2016 at Unknown time  . gabapentin (NEURONTIN) 100 MG capsule Take 200 mg by mouth 3 (three) times daily.   10/21/2016 at Unknown time  . insulin glargine (LANTUS) 100 UNIT/ML injection Inject 0.24 mLs (24 Units total) into the skin at bedtime. 10  mL 11 10/21/2016 at Unknown time  . metFORMIN (GLUCOPHAGE) 1000 MG tablet TAKE 1 TABLET TWICE A DAY WITH MEALS   10/21/2016 at Unknown time  . metoprolol (LOPRESSOR) 100 MG tablet Take 1 tablet (100 mg total) by mouth 2 (two) times daily. 60 tablet 3 10/21/2016 at 1600  . oxyCODONE-acetaminophen (PERCOCET) 7.5-325 MG tablet Take 1 tablet by mouth every 4 (four) hours as needed for severe pain. 30 tablet 0 prn at prn  . polyethylene glycol (MIRALAX / GLYCOLAX) packet Take 17 g by mouth daily as needed for mild constipation. 14 each 0 prn at prn  . warfarin (COUMADIN) 5 MG tablet Take 5 mg by mouth at bedtime.    10/21/2016 at Unknown time   Scheduled: . aspirin EC  81 mg Oral Daily  . atorvastatin  80 mg Oral q1800  . diltiazem  300 mg Oral Daily  . docusate sodium  100 mg Oral BID  . furosemide  20 mg Oral Daily  . gabapentin  200 mg Oral TID  . insulin aspart  0-5 Units Subcutaneous QHS  . insulin aspart  0-9 Units Subcutaneous TID WC  . metoprolol tartrate  100 mg Oral BID    ROS: History obtained from the patient  General ROS: negative for - chills, fatigue, fever, night sweats, weight gain or weight loss Psychological ROS: negative for - behavioral disorder, hallucinations, memory difficulties, mood swings or suicidal ideation Ophthalmic ROS: negative for - blurry vision, double vision, eye pain or loss of vision ENT ROS: negative for - epistaxis, nasal discharge, oral lesions, sore throat, tinnitus or vertigo Allergy and Immunology ROS: negative for - hives or itchy/watery eyes Hematological and Lymphatic ROS: negative for - bleeding problems, bruising or swollen lymph nodes Endocrine ROS: negative for - galactorrhea, hair pattern changes, polydipsia/polyuria or temperature intolerance Respiratory ROS: negative for - cough, hemoptysis, shortness of breath or wheezing Cardiovascular ROS: negative for - chest pain, dyspnea on exertion, edema or irregular heartbeat Gastrointestinal ROS:  negative for - abdominal pain, diarrhea, hematemesis, nausea/vomiting or stool incontinence Genito-Urinary ROS: negative for - dysuria, hematuria, incontinence or urinary frequency/urgency Musculoskeletal ROS: LLE pain Neurological ROS: as noted in HPI Dermatological ROS: negative for rash and skin lesion changes  Physical Examination: Blood pressure (!) 143/65, pulse 70, temperature 98.2 F (36.8 C), temperature source Oral, resp. rate (!) 22, height 5\' 7"  (1.702 m), weight 86.2 kg (190 lb), SpO2 97 %.  HEENT-  Normocephalic, no lesions, without obvious abnormality.  Normal external eye and conjunctiva.  Normal TM's bilaterally.  Normal auditory canals and external ears. Normal external nose, mucus membranes and septum.  Normal pharynx. Cardiovascular- S1, S2 normal, pulses palpable throughout   Lungs- chest clear, no wheezing, rales, normal symmetric air entry Abdomen- soft, non-tender; bowel sounds normal; no masses,  no organomegaly Extremities- LLE splinting, amputations apparent  Lymph-no adenopathy palpable Musculoskeletal-LLE tenderness, erythema in distal left leg Skin-warm and dry, no hyperpigmentation, vitiligo, or suspicious lesions  Neurological Examination   Mental Status: Alert, oriented, thought content appropriate.  Speech fluent without evidence of aphasia.  Able to follow 3 step commands without difficulty. Cranial Nerves: II: Discs flat bilaterally; Visual fields grossly normal, pupils equal, round, reactive to light and accommodation III,IV, VI: ptosis not present, extra-ocular motions intact bilaterally V,VII: smile symmetric, facial light touch sensation normal bilaterally VIII: hearing normal bilaterally IX,X: gag reflex present XI: bilateral shoulder shrug XII: midline tongue extension Motor: Right : Upper extremity   5/5    Left:     Upper extremity   5/5  Lower extremity   5/5     Lower extremity   5/5 Tone and bulk:normal tone throughout; no atrophy  noted Sensory: Pinprick and light touch intact throughout, bilaterally.  Decreased vibratory sensation to the knees bilaterally Deep Tendon Reflexes: 2+ in the upper extremities, absent in the lower extremities Plantars: Right: mute   Left: unable to test Cerebellar: Normal finger-to-nose and normal heel-to-shin testing bilaterally Gait: not tested due to safety concerns    Laboratory Studies:   Basic Metabolic Panel:  Recent Labs Lab 10/21/16 2028  NA 135  K 3.8  CL 99*  CO2 23  GLUCOSE 162*  BUN 29*  CREATININE 1.18  CALCIUM 8.7*    Liver Function Tests:  Recent Labs Lab 10/21/16 2028  AST 64*  ALT 93*  ALKPHOS 371*  BILITOT 0.8  PROT 7.5  ALBUMIN 3.4*   No results for input(s): LIPASE, AMYLASE in the last 168 hours. No results for input(s): AMMONIA in the last 168 hours.  CBC:  Recent Labs Lab 10/21/16 2028  WBC 15.2*  NEUTROABS 11.1*  HGB 13.9  HCT 42.3  MCV 85.7  PLT 298    Cardiac Enzymes:  Recent Labs Lab 10/21/16 2028 10/22/16 0945  CKTOTAL  --  131  TROPONINI <0.03  --     BNP: Invalid input(s): POCBNP  CBG:  Recent Labs Lab 10/22/16 0049 10/22/16 0732 10/22/16 1259  GLUCAP 141* 7 133*    Microbiology: Results for orders placed or performed during the hospital encounter of 05/18/16  Blood culture (routine x 2)     Status: None   Collection Time: 05/18/16 11:26 PM  Result Value Ref Range Status   Specimen Description BLOOD  L HAND  Final   Special Requests   Final    BOTTLES DRAWN AEROBIC AND ANAEROBIC  AER 14 ML ANA 12 ML   Culture NO GROWTH 5 DAYS  Final   Report Status 05/23/2016 FINAL  Final  Blood culture (routine x 2)     Status: None   Collection Time: 05/18/16 11:27 PM  Result Value Ref Range Status   Specimen Description BLOOD  L AC  Final   Special Requests   Final    BOTTLES DRAWN AEROBIC AND ANAEROBIC  AER 12 ML ANA 12 ML   Culture NO GROWTH 5 DAYS  Final   Report Status 05/23/2016 FINAL  Final     Coagulation Studies:  Recent Labs  10/22/16 0522  LABPROT 51.4*  INR 5.47*    Urinalysis: No results for input(s): COLORURINE, LABSPEC, PHURINE, GLUCOSEU, HGBUR, BILIRUBINUR, KETONESUR, PROTEINUR, UROBILINOGEN, NITRITE, LEUKOCYTESUR in the last 168 hours.  Invalid input(s): APPERANCEUR  Lipid Panel:     Component Value Date/Time   CHOL 100 10/22/2016 0522   TRIG 78 10/22/2016 0522   HDL  28 (L) 10/22/2016 0522   CHOLHDL 3.6 10/22/2016 0522   VLDL 16 10/22/2016 0522   LDLCALC 56 10/22/2016 0522    HgbA1C: No results found for: HGBA1C  Urine Drug Screen:  No results found for: LABOPIA, COCAINSCRNUR, LABBENZ, AMPHETMU, THCU, LABBARB  Alcohol Level: No results for input(s): ETH in the last 168 hours.  Other results: EKG: atrial fibrillation, rate 84 bpm.  Imaging: Ct Head Wo Contrast  Result Date: 10/21/2016 CLINICAL DATA:  Ataxia following foot surgery resulting in a fall today. EXAM: CT HEAD WITHOUT CONTRAST TECHNIQUE: Contiguous axial images were obtained from the base of the skull through the vertex without intravenous contrast. COMPARISON:  None. FINDINGS: Brain: Diffusely enlarged ventricles and subarachnoid spaces. Patchy white matter low density in both cerebral hemispheres. No intracranial hemorrhage, mass lesion or CT evidence of acute infarction. Vascular: No hyperdense vessel or unexpected calcification. Skull: Normal. Negative for fracture or focal lesion. Sinuses/Orbits: Unremarkable. Other: None. IMPRESSION: No acute abnormality. Mild to moderate diffuse cerebral atrophy and minimal chronic small vessel white matter ischemic changes in both cerebral hemispheres. Electronically Signed   By: Claudie Revering M.D.   On: 10/21/2016 20:20   Mr [name] Neck W Wo Contrast  Result Date: 10/22/2016 CLINICAL DATA:  Initial evaluation for ataxia, multiple falls. EXAM: MRI HEAD WITHOUT CONTRAST MRA HEAD WITHOUT CONTRAST MRA NECK WITHOUT AND WITH CONTRAST TECHNIQUE: Multiplanar,  multiecho pulse sequences of the brain and surrounding structures were obtained without intravenous contrast. Angiographic images of the Circle of Willis were obtained using MRA technique without intravenous contrast. Angiographic images of the neck were obtained using MRA technique without and with intravenous contrast. Carotid stenosis measurements (when applicable) are obtained utilizing NASCET criteria, using the distal internal carotid diameter as the denominator. CONTRAST:  76mL MULTIHANCE GADOBENATE DIMEGLUMINE 529 MG/ML IV SOLN COMPARISON:  Prior CT from 10/21/2016. FINDINGS: MRI HEAD FINDINGS Diffuse prominence of the CSF containing spaces is compatible with generalized age related cerebral atrophy. Patchy and confluent T2/FLAIR hyperintensity within the periventricular and deep white matter both cerebral hemispheres most consistent with chronic small vessel ischemic disease, mild to moderate nature. Chronic microvascular changes present within the pons. The no abnormal foci of restricted diffusion to suggest acute or subacute ischemia. Gray-white matter differentiation maintained. No encephalomalacia to suggest chronic infarction. No evidence for acute or chronic intracranial hemorrhage. No mass lesion, midline shift or mass effect. Ventricles normal size without evidence for hydrocephalus. No extra-axial fluid collection. Major dural sinuses are grossly patent. Pituitary gland within normal limits. Midline structures intact and normal. Major intracranial vascular flow voids are maintained. Vertebrobasilar system is diminutive. Mild degenerative thickening noted at the tectorial membrane without significant stenosis. Craniocervical junction otherwise unremarkable. Visualized upper cervical spine within normal limits. Bone marrow signal intensity normal. No scalp soft tissue abnormality. Globes normal soft tissues within normal limits. Patient status post lens extraction bilaterally. Paranasal sinuses and  mastoid air cells are clear. Inner ear structures normal. MRA HEAD FINDINGS ANTERIOR CIRCULATION: Study mildly degraded by motion artifact. The Distal cervical segments of the internal carotid arteries are widely patent with antegrade flow. Petrous, cavernous, and supraclinoid segments patent without flow-limiting stenosis. ICA termini widely patent. A1 segments patent bilaterally. Anterior cerebral arteries patent to their distal aspects without stenosis. M1 segments patent without stenosis or occlusion. MCA bifurcations normal. No proximal M2 occlusion. Distal MCA branches demonstrate mild small vessel atheromatous irregularity but are well opacified and fairly symmetric. POSTERIOR CIRCULATION: Vertebral arteries are diminutive, with dominant right vertebral artery.  Minimal atheromatous irregularity within the right vertebral artery without flow-limiting stenosis. Left V4 segment is hypoplastic and somewhat attenuated with atheromatous moderate irregularity, but is opacified to the vertebrobasilar junction. Posterior inferior cerebral arteries are patent proximally. Basilar artery diminutive with multifocal atheromatous irregularity with moderate to severe multifocal stenoses at its mid and distal aspect (series 109, image 9). Superior cerebral arteries are patent bilaterally. Fetal type origin of the PCAs supplied via widely patent posterior communicating arteries. Small hypoplastic left P1 segment noted. PCAs grossly patent to their distal aspects, although not well evaluated due to motion artifact. No aneurysm or other vascular malformation. MRA NECK FINDINGS Visualized aortic arch of normal caliber with normal branch pattern. No high-grade stenosis about the origin of the great vessels. Visualized subclavian arteries widely patent. Right common and internal carotid arteries are widely patent without high-grade or flow-limiting stenosis. Mild atheromatous irregularity about the right carotid bifurcation without  significant stenosis. Left common internal carotid arteries are widely patent without high-grade or flow-limiting stenosis. Mild atheromatous irregularity about the left carotid bifurcation without significant stenosis. Dominant right vertebral artery rises from the right subclavian artery. Origin of the right vertebral artery not well evaluated on this exam, with possible superimposed stenosis not excluded (series 100, image 6). Right V1 and V2 segments are widely patent distally. Right V3 segment not well evaluated, but appears grossly patent on source imaging. Left vertebral artery occluded just distal to its origin, and remains occluded within the neck. Visible flow within the intracranial left V4 segment as detailed on the MRA head portion of this exam. IMPRESSION: MRI HEAD IMPRESSION: 1. No acute intracranial process identified. 2. Generalized age-related cerebral atrophy with mild to moderate chronic small vessel ischemic disease. MRA HEAD IMPRESSION: 1. Fetal type origin of the PCAs with secondary diminutive vertebrobasilar system. 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 Left vertebral artery is occluded within the neck (see below). Intracranial left V4 segment is opacified, which may be via collateralization and/or retrograde flow across the vertebrobasilar junction. Dominant right vertebral artery patent without significant stenosis. A component of vertebrobasilar insufficiency may be contributing to patient's symptoms. 2. Widely patent anterior circulation without stenosis or large vessel occlusion. 3. Distal small vessel atheromatous irregularity. MRA NECK IMPRESSION: 1. Occlusion of the left vertebral artery at its origin, which remains occluded throughout the neck. Dominant right vertebral artery patent without obvious stenosis, although the origin of the right vertebral artery not well evaluated on this exam. 2. Widely patent  carotid artery systems bilaterally without flow-limiting stenosis. Electronically Signed   By: Jeannine Boga M.D.   On: 10/22/2016 14:44   Mr Brain Wo Contrast  Result Date: 10/22/2016 CLINICAL DATA:  Initial evaluation for ataxia, multiple falls. EXAM: MRI HEAD WITHOUT CONTRAST MRA HEAD WITHOUT CONTRAST MRA NECK WITHOUT AND WITH CONTRAST TECHNIQUE: Multiplanar, multiecho pulse sequences of the brain and surrounding structures were obtained without intravenous contrast. Angiographic images of the Circle of Willis were obtained using MRA technique without intravenous contrast. Angiographic images of the neck were obtained using MRA technique without and with intravenous contrast. Carotid stenosis measurements (when applicable) are obtained utilizing NASCET criteria, using the distal internal carotid diameter as the denominator. CONTRAST:  51mL MULTIHANCE GADOBENATE DIMEGLUMINE 529 MG/ML IV SOLN COMPARISON:  Prior CT from 10/21/2016. FINDINGS: MRI HEAD FINDINGS Diffuse prominence of the CSF containing spaces is compatible with generalized age related cerebral atrophy. Patchy and confluent T2/FLAIR hyperintensity within the periventricular and deep white matter both  cerebral hemispheres most consistent with chronic small vessel ischemic disease, mild to moderate nature. Chronic microvascular changes present within the pons. The no abnormal foci of restricted diffusion to suggest acute or subacute ischemia. Gray-white matter differentiation maintained. No encephalomalacia to suggest chronic infarction. No evidence for acute or chronic intracranial hemorrhage. No mass lesion, midline shift or mass effect. Ventricles normal size without evidence for hydrocephalus. No extra-axial fluid collection. Major dural sinuses are grossly patent. Pituitary gland within normal limits. Midline structures intact and normal. Major intracranial vascular flow voids are maintained. Vertebrobasilar system is diminutive. Mild  degenerative thickening noted at the tectorial membrane without significant stenosis. Craniocervical junction otherwise unremarkable. Visualized upper cervical spine within normal limits. Bone marrow signal intensity normal. No scalp soft tissue abnormality. Globes normal soft tissues within normal limits. Patient status post lens extraction bilaterally. Paranasal sinuses and mastoid air cells are clear. Inner ear structures normal. MRA HEAD FINDINGS ANTERIOR CIRCULATION: Study mildly degraded by motion artifact. The Distal cervical segments of the internal carotid arteries are widely patent with antegrade flow. Petrous, cavernous, and supraclinoid segments patent without flow-limiting stenosis. ICA termini widely patent. A1 segments patent bilaterally. Anterior cerebral arteries patent to their distal aspects without stenosis. M1 segments patent without stenosis or occlusion. MCA bifurcations normal. No proximal M2 occlusion. Distal MCA branches demonstrate mild small vessel atheromatous irregularity but are well opacified and fairly symmetric. POSTERIOR CIRCULATION: Vertebral arteries are diminutive, with dominant right vertebral artery. Minimal atheromatous irregularity within the right vertebral artery without flow-limiting stenosis. Left V4 segment is hypoplastic and somewhat attenuated with atheromatous moderate irregularity, but is opacified to the vertebrobasilar junction. Posterior inferior cerebral arteries are patent proximally. Basilar artery diminutive with multifocal atheromatous irregularity with moderate to severe multifocal stenoses at its mid and distal aspect (series 109, image 9). Superior cerebral arteries are patent bilaterally. Fetal type origin of the PCAs supplied via widely patent posterior communicating arteries. Small hypoplastic left P1 segment noted. PCAs grossly patent to their distal aspects, although not well evaluated due to motion artifact. No aneurysm or other vascular  malformation. MRA NECK FINDINGS Visualized aortic arch of normal caliber with normal branch pattern. No high-grade stenosis about the origin of the great vessels. Visualized subclavian arteries widely patent. Right common and internal carotid arteries are widely patent without high-grade or flow-limiting stenosis. Mild atheromatous irregularity about the right carotid bifurcation without significant stenosis. Left common internal carotid arteries are widely patent without high-grade or flow-limiting stenosis. Mild atheromatous irregularity about the left carotid bifurcation without significant stenosis. Dominant right vertebral artery rises from the right subclavian artery. Origin of the right vertebral artery not well evaluated on this exam, with possible superimposed stenosis not excluded (series 100, image 6). Right V1 and V2 segments are widely patent distally. Right V3 segment not well evaluated, but appears grossly patent on source imaging. Left vertebral artery occluded just distal to its origin, and remains occluded within the neck. Visible flow within the intracranial left V4 segment as detailed on the MRA head portion of this exam. IMPRESSION: MRI HEAD IMPRESSION: 1. No acute intracranial process identified. 2. Generalized age-related cerebral atrophy with mild to moderate chronic small vessel ischemic disease. MRA HEAD IMPRESSION: 1. Fetal type origin of the PCAs with secondary diminutive vertebrobasilar system. 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 Left vertebral artery is occluded within the neck (see below). Intracranial left V4 segment is opacified, which may be via collateralization and/or retrograde flow across the vertebrobasilar  junction. Dominant right vertebral artery patent without significant stenosis. A component of vertebrobasilar insufficiency may be contributing to patient's symptoms. 2. Widely patent anterior  circulation without stenosis or large vessel occlusion. 3. Distal small vessel atheromatous irregularity. MRA NECK IMPRESSION: 1. Occlusion of the left vertebral artery at its origin, which remains occluded throughout the neck. Dominant right vertebral artery patent without obvious stenosis, although the origin of the right vertebral artery not well evaluated on this exam. 2. Widely patent carotid artery systems bilaterally without flow-limiting stenosis. Electronically Signed   By: Jeannine Boga M.D.   On: 10/22/2016 14:44   US Venous Img Lower Unilateral Left  Result Date: 10/22/2016 CLINICAL DATA:  Left thigh pain for 3 months. EXAM: Left LOWER EXTREMITY VENOUS DOPPLER ULTRASOUND TECHNIQUE: Gray-scale sonography with graded compression, as well as color Doppler and duplex ultrasound were performed to evaluate the lower extremity deep venous systems from the level of the common femoral vein and including the common femoral, femoral, profunda femoral, popliteal and calf veins including the posterior tibial, peroneal and gastrocnemius veins when visible. The superficial great saphenous vein was also interrogated. Spectral Doppler was utilized to evaluate flow at rest and with distal augmentation maneuvers in the common femoral, femoral and popliteal veins. COMPARISON:  None. FINDINGS: Contralateral Common Femoral Vein: Respiratory phasicity is normal and symmetric with the symptomatic side. No evidence of thrombus. Normal compressibility. Common Femoral Vein: No evidence of thrombus. Normal compressibility, respiratory phasicity and response to augmentation. Saphenofemoral Junction: No evidence of thrombus. Normal compressibility and flow on color Doppler imaging. Profunda Femoral Vein: No evidence of thrombus. Normal compressibility and flow on color Doppler imaging. Femoral Vein: No evidence of thrombus. Normal compressibility, respiratory phasicity and response to augmentation. Popliteal Vein: No  evidence of thrombus. Normal compressibility, respiratory phasicity and response to augmentation. Calf Veins: No evidence of thrombus. Normal compressibility and flow on color Doppler imaging. Venous Reflux:  None. Other Findings:  None. IMPRESSION: No evidence of DVT within the left lower extremity. Electronically Signed   By: Marijo Conception, M.D.   On: 10/22/2016 14:25   Dg Foot Complete Left  Result Date: 10/21/2016 CLINICAL DATA:  Patient lost balance and fell today. Recent foot surgery. EXAM: LEFT FOOT - COMPLETE 3+ VIEW COMPARISON:  None. FINDINGS: Three-view exam of the left foot shows amputation of the third, fourth, and fifth toes. No fracture. No subluxation or dislocation. Vascular calcification suggest diabetes. IMPRESSION: Status post 3 toe amputation.  No acute findings. Electronically Signed   By: Misty Stanley M.D.   On: 10/21/2016 20:43   Mr [name] Head/brain GX Cm  Result Date: 10/22/2016 CLINICAL DATA:  Initial evaluation for ataxia, multiple falls. EXAM: MRI HEAD WITHOUT CONTRAST MRA HEAD WITHOUT CONTRAST MRA NECK WITHOUT AND WITH CONTRAST TECHNIQUE: Multiplanar, multiecho pulse sequences of the brain and surrounding structures were obtained without intravenous contrast. Angiographic images of the Circle of Willis were obtained using MRA technique without intravenous contrast. Angiographic images of the neck were obtained using MRA technique without and with intravenous contrast. Carotid stenosis measurements (when applicable) are obtained utilizing NASCET criteria, using the distal internal carotid diameter as the denominator. CONTRAST:  58mL MULTIHANCE GADOBENATE DIMEGLUMINE 529 MG/ML IV SOLN COMPARISON:  Prior CT from 10/21/2016. FINDINGS: MRI HEAD FINDINGS Diffuse prominence of the CSF containing spaces is compatible with generalized age related cerebral atrophy. Patchy and confluent T2/FLAIR hyperintensity within the periventricular and deep white matter both cerebral hemispheres most  consistent with chronic small vessel ischemic disease,  mild to moderate nature. Chronic microvascular changes present within the pons. The no abnormal foci of restricted diffusion to suggest acute or subacute ischemia. Gray-white matter differentiation maintained. No encephalomalacia to suggest chronic infarction. No evidence for acute or chronic intracranial hemorrhage. No mass lesion, midline shift or mass effect. Ventricles normal size without evidence for hydrocephalus. No extra-axial fluid collection. Major dural sinuses are grossly patent. Pituitary gland within normal limits. Midline structures intact and normal. Major intracranial vascular flow voids are maintained. Vertebrobasilar system is diminutive. Mild degenerative thickening noted at the tectorial membrane without significant stenosis. Craniocervical junction otherwise unremarkable. Visualized upper cervical spine within normal limits. Bone marrow signal intensity normal. No scalp soft tissue abnormality. Globes normal soft tissues within normal limits. Patient status post lens extraction bilaterally. Paranasal sinuses and mastoid air cells are clear. Inner ear structures normal. MRA HEAD FINDINGS ANTERIOR CIRCULATION: Study mildly degraded by motion artifact. The Distal cervical segments of the internal carotid arteries are widely patent with antegrade flow. Petrous, cavernous, and supraclinoid segments patent without flow-limiting stenosis. ICA termini widely patent. A1 segments patent bilaterally. Anterior cerebral arteries patent to their distal aspects without stenosis. M1 segments patent without stenosis or occlusion. MCA bifurcations normal. No proximal M2 occlusion. Distal MCA branches demonstrate mild small vessel atheromatous irregularity but are well opacified and fairly symmetric. POSTERIOR CIRCULATION: Vertebral arteries are diminutive, with dominant right vertebral artery. Minimal atheromatous irregularity within the right vertebral  artery without flow-limiting stenosis. Left V4 segment is hypoplastic and somewhat attenuated with atheromatous moderate irregularity, but is opacified to the vertebrobasilar junction. Posterior inferior cerebral arteries are patent proximally. Basilar artery diminutive with multifocal atheromatous irregularity with moderate to severe multifocal stenoses at its mid and distal aspect (series 109, image 9). Superior cerebral arteries are patent bilaterally. Fetal type origin of the PCAs supplied via widely patent posterior communicating arteries. Small hypoplastic left P1 segment noted. PCAs grossly patent to their distal aspects, although not well evaluated due to motion artifact. No aneurysm or other vascular malformation. MRA NECK FINDINGS Visualized aortic arch of normal caliber with normal branch pattern. No high-grade stenosis about the origin of the great vessels. Visualized subclavian arteries widely patent. Right common and internal carotid arteries are widely patent without high-grade or flow-limiting stenosis. Mild atheromatous irregularity about the right carotid bifurcation without significant stenosis. Left common internal carotid arteries are widely patent without high-grade or flow-limiting stenosis. Mild atheromatous irregularity about the left carotid bifurcation without significant stenosis. Dominant right vertebral artery rises from the right subclavian artery. Origin of the right vertebral artery not well evaluated on this exam, with possible superimposed stenosis not excluded (series 100, image 6). Right V1 and V2 segments are widely patent distally. Right V3 segment not well evaluated, but appears grossly patent on source imaging. Left vertebral artery occluded just distal to its origin, and remains occluded within the neck. Visible flow within the intracranial left V4 segment as detailed on the MRA head portion of this exam. IMPRESSION: MRI HEAD IMPRESSION: 1. No acute intracranial process  identified. 2. Generalized age-related cerebral atrophy with mild to moderate chronic small vessel ischemic disease. MRA HEAD IMPRESSION: 1. Fetal type origin of the PCAs with secondary diminutive vertebrobasilar system. 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 Left vertebral artery is occluded within the neck (see below). Intracranial left V4 segment is opacified, which may be via collateralization and/or retrograde flow across the vertebrobasilar junction. Dominant right vertebral artery patent without significant stenosis. A  component of vertebrobasilar insufficiency may be contributing to patient's symptoms. 2. Widely patent anterior circulation without stenosis or large vessel occlusion. 3. Distal small vessel atheromatous irregularity. MRA NECK IMPRESSION: 1. Occlusion of the left vertebral artery at its origin, which remains occluded throughout the neck. Dominant right vertebral artery patent without obvious stenosis, although the origin of the right vertebral artery not well evaluated on this exam. 2. Widely patent carotid artery systems bilaterally without flow-limiting stenosis. Electronically Signed   By: Jeannine Boga M.D.   On: 10/22/2016 14:44     Assessment/Plan: 82 year old male with multiple fall since amputation.  This has likely affected balance.  Patient also has an underlying peripheral neuropathy (likely from diabetes, historically A1c has been elevated) which may be affecting his balance as well, particularly so since the amputation.  MRI reviewed and shows no acute changes.  MRA shows left vertebral occlusion but do not believe this is the etiology of the patient's presentation.  Would continue anticoagulation with some adjustment unless working with therapy can not decrease the frequency of falls.  INR currently supratherapeutic.     Alexis Goodell, MD Neurology [phone] 10/22/2016, 4:02 PM

## 2016-10-22 NOTE — NC FL2 (Signed)
[place] LEVEL OF CARE SCREENING TOOL     IDENTIFICATION  Patient Name: [name] Birthdate: 1935-05-07 Sex: male Admission Date (Current Location): 10/21/2016  [place] and [place] Number:  Engineering geologist and Address:  [facility], [address] [place], [place] [address]      Provider Number: [id number]  Attending Physician Name and Address:  Theodoro Grist, MD  Relative Name and Phone Number:       Current Level of Care: Hospital Recommended Level of Care: [place] Prior Approval Number:    Date Approved/Denied:   PASRR Number:  ([id number] )  Discharge Plan: SNF    Current Diagnoses: Patient Active Problem List   Diagnosis Date Noted  . Ataxia 10/21/2016  . Atherosclerosis of native arteries of the extremities with ulceration ([place]) 10/12/2016  . History of femoral angiogram 09/06/2016  . Left leg pain 09/06/2016  . Leukocytosis 09/06/2016  . Generalized weakness 09/06/2016  . Acute renal injury ([place]) 09/06/2016  . A-fib ([place]) 08/30/2016  . Pneumonia 05/19/2016  . Hyperlipidemia 04/20/2016  . Leg pain, left 04/19/2016  . Back pain 04/19/2016  . Type 2 diabetes mellitus treated with insulin ([place]) 04/19/2016  . Essential hypertension 04/19/2016  . PAD (peripheral artery disease) ([place]) 04/19/2016  . Erectile dysfunction following radical prostatectomy 06/25/2015  . History of prostate cancer 12/23/2014  . Incontinence 12/23/2014    Orientation RESPIRATION BLADDER Height & Weight     Self, Time, Situation, Place  Normal Continent Weight: 190 lb (86.2 kg) Height:  5\' 7"  (170.2 cm)  BEHAVIORAL SYMPTOMS/MOOD NEUROLOGICAL BOWEL NUTRITION STATUS   (none)  (none) Continent Diet (Diet: Carb Modified )  AMBULATORY STATUS COMMUNICATION OF NEEDS Skin   Extensive Assist Verbally Surgical wounds (10/15/16: Incision: Left Foot. )                       Personal Care Assistance Level of  Assistance  Bathing, Feeding, Dressing Bathing Assistance: Limited assistance Feeding assistance: Independent Dressing Assistance: Limited assistance     Functional Limitations Info  Sight, Hearing, Speech Sight Info: Adequate Hearing Info: Adequate Speech Info: Adequate    SPECIAL CARE FACTORS FREQUENCY  PT (By licensed PT), OT (By licensed OT)     PT Frequency:  (5) OT Frequency:  (5)            Contractures      Additional Factors Info  Code Status, Allergies, Insulin Sliding Scale Code Status Info:  (Full Code. ) Allergies Info:  (Contrast Media Iodinated Diagnostic Agents, Iohexol, Latex)   Insulin Sliding Scale Info:  (NovoLog Insulin Injections. )       Current Medications (10/22/2016):  This is the current hospital active medication list Current Facility-Administered Medications  Medication Dose Route Frequency Provider Last Rate Last Dose  . acetaminophen (TYLENOL) tablet 650 mg  650 mg Oral Q4H PRN Lance Coon, MD       Or  . acetaminophen (TYLENOL) solution 650 mg  650 mg Per Tube Q4H PRN Lance Coon, MD       Or  . acetaminophen (TYLENOL) suppository 650 mg  650 mg Rectal Q4H PRN Lance Coon, MD      . aspirin EC tablet 81 mg  81 mg Oral Daily Lance Coon, MD   81 mg at 10/22/16 2035  . atorvastatin (LIPITOR) tablet 80 mg  80 mg Oral q1800 Lance Coon, MD      . diltiazem [facility] CD)  24 hr capsule 300 mg  300 mg Oral Daily Lance Coon, MD   300 mg at 10/22/16 8676  . furosemide (LASIX) tablet 20 mg  20 mg Oral Daily Lance Coon, MD   20 mg at 10/22/16 7209  . gabapentin (NEURONTIN) capsule 200 mg  200 mg Oral TID Lance Coon, MD   200 mg at 10/22/16 4709  . insulin aspart (novoLOG) injection 0-5 Units  0-5 Units Subcutaneous QHS Lance Coon, MD      . insulin aspart (novoLOG) injection 0-9 Units  0-9 Units Subcutaneous TID WC Lance Coon, MD      . metoprolol tartrate (LOPRESSOR) tablet 100 mg  100 mg Oral BID Lance Coon, MD   100 mg  at 10/22/16 6283  . morphine 2 MG/ML injection 2 mg  2 mg Intravenous Q4H PRN Harrie Foreman, MD   2 mg at 10/22/16 0934  . oxyCODONE-acetaminophen (PERCOCET) 7.5-325 MG per tablet 1 tablet  1 tablet Oral Q4H PRN Lance Coon, MD   1 tablet at 10/22/16 0149  . vancomycin (VANCOCIN) IVPB 1000 mg/200 mL premix  1,000 mg Intravenous Q18H Lance Coon, MD   Stopped at 10/22/16 0500     Discharge Medications: Please see discharge summary for a list of discharge medications.  Relevant Imaging Results:  Relevant Lab Results:   Additional Information  (SSN: [id number])  Sample, Veronia Beets, LCSW

## 2016-10-22 NOTE — Clinical Social Work Note (Signed)
Clinical Social Work Assessment  Patient Details  Name: [name] MRN: [id number] Date of Birth: 11/29/1934  Date of referral:  10/22/16               Reason for consult:  Facility Placement                Permission sought to share information with:  Chartered certified accountant granted to share information::  Yes, Verbal Permission Granted  Name::      [place]::   [place]   Relationship::     Contact Information:     Housing/Transportation Living arrangements for the past 2 months:  Single Family Home Source of Information:  Spouse Patient Interpreter Needed:  None Criminal Activity/Legal Involvement Pertinent to Current Situation/Hospitalization:  No - Comment as needed Significant Relationships:  Adult Children, Spouse Lives with:  Spouse Do you feel safe going back to the place where you live?    Need for family participation in patient care:     Care giving concerns:  Patient lives in [place] with his wife Everlene Farrier.    Social Worker assessment / plan:  Holiday representative (CSW) reviewed chart and noted that PT is recommending SNF. CSW attempted to meet with patient however he was off the floor for an MRI. CSW contacted patient's wife Everlene Farrier to discuss D/C plan. Per wife patient lives with her in [place] and has been at [facility] for 2 weeks in April 2018. CSW explained SNF process and that patient will likely be in his co-pay days. Wife verbalized her understanding and reported that they will pay the co-pays because he has to go to rehab. Wife is agreeable to SNF search in [place]. FL2 complete and faxed out.   CSW presented bed offers to patient's wife and she chose [facility]. Per MD patient may D/C over the weekend. [facility] liaison is aware of above and stated that patient can come to [facility] over the weekend. Patient's wife is aware of above. CSW will continue to follow and assist as needed.   Employment status:   Retired Nurse, adult PT Recommendations:  [place] / Referral to community resources:  [place]  Patient/Family's Response to care:  Patient's wife is agreeable for patient to D/C to [facility].   Patient/Family's Understanding of and Emotional Response to Diagnosis, Current Treatment, and Prognosis:  Patient's wife was very pleasant and thanked CSW for assistance.   Emotional Assessment Appearance:  Appears stated age Attitude/Demeanor/Rapport:    Affect (typically observed):  Accepting, Adaptable, Pleasant Orientation:  Oriented to Self, Oriented to Place, Oriented to  Time, Oriented to Situation Alcohol / Substance use:  Not Applicable Psych involvement (Current and /or in the community):  No (Comment)  Discharge Needs  Concerns to be addressed:  Discharge Planning Concerns Readmission within the last 30 days:  No Current discharge risk:  Dependent with Mobility Barriers to Discharge:  Continued Medical Work up   [employer], Veronia Beets, LCSW 10/22/2016, 1:51 PM

## 2016-10-22 NOTE — Evaluation (Signed)
Physical Therapy Evaluation Patient Details Name: [name] MRN: [id number] DOB: 04/26/1935 Today's Date: 10/22/2016   History of Present Illness  Pt. is a 82 y.o. male who was admitted to [facility] following multiple Falls at home after having recent toe amputations on 10/15/2016. Pt. PMHX includes: A-Fib, DM, PAD, Chronic leg pain, leukocytosis, and acute renal insufficiency.  Clinical Impression  Pt was able to participate well with bed activities and mobility, but very much struggled with getting to standing and more notedly with any dynamic standing activity (including attempts at stepping, any movement) showing poor balance, poor tolerance and poor confidence.  Pt is not safe to return home and will need rehab to get back to some functional mobility.    Follow Up Recommendations SNF    Equipment Recommendations       Recommendations for Other Services       Precautions / Restrictions Precautions Precautions: Fall Restrictions Weight Bearing Restrictions: Yes LLE Weight Bearing: Weight bearing as tolerated (with orthowedge boot)      Mobility  Bed Mobility Overal bed mobility: Modified Independent             General bed mobility comments: Pt did need some cuing to appropriately get to EOB and use rails to rise  Transfers Overall transfer level: Needs assistance Equipment used: Rolling walker (2 wheeled) Transfers: Sit to/from Stand Sit to Stand: Min assist         General transfer comment: On the first attempt pt was unable to achieve standing w/o assist, on second try with min assist he was able to rise  Ambulation/Gait Ambulation/Gait assistance: Min assist Ambulation Distance (Feet): 3 Feet Assistive device: Rolling walker (2 wheeled)       General Gait Details: Pt able to get to the recliner but struggled, needed direct assist to move walker and heavy verbal and tactile cuing to take even very small, labored "steps"  Stairs             Wheelchair Mobility    Modified Rankin (Stroke Patients Only)       Balance Overall balance assessment: Needs assistance Sitting-balance support: Single extremity supported Sitting balance-Leahy Scale: Good     Standing balance support: Bilateral upper extremity supported Standing balance-Leahy Scale: Poor Standing balance comment: Pt heavily reliant on the walker to maintain balance and ultimately was unsteady, unsafe and lacked confidence                             Pertinent Vitals/Pain Pain Assessment: 0-10 Pain Score: 8  (10/10 with movement) Pain Location: Left LE Pain Descriptors / Indicators: Aching;Burning Pain Intervention(s): Limited activity within patient's tolerance;Monitored during session;Patient requesting pain meds-RN notified;RN gave pain meds during session    Home Living Family/patient expects to be discharged to:: Private residence Living Arrangements: Spouse/significant other Available Help at Discharge: Family;Available 24 hours/day Type of Home: House Home Access: Level entry     Home Layout: One level Home Equipment: Walker - 2 wheels;Shower seat - built in      Prior Function Level of Independence: Independent with assistive device(s)         Comments: prior to amputations pt was able to ambualte w/o AD and do all he needed, had started using walker more recently.  Has been largely immobile the last week     Hand Dominance   Dominant Hand: Right    Extremity/Trunk Assessment   Upper Extremity Assessment Upper Extremity  Assessment: Overall WFL for tasks assessed    Lower Extremity Assessment Lower Extremity Assessment: Generalized weakness (pain with all L LE movement grossly 3+/5, R grossly 4-/5)       Communication   Communication: No difficulties  Cognition Arousal/Alertness: Awake/alert Behavior During Therapy: WFL for tasks assessed/performed Overall Cognitive Status: Within Functional Limits for tasks  assessed                                        General Comments      Exercises     Assessment/Plan    PT Assessment Patient needs continued PT services  PT Problem List Decreased strength;Decreased range of motion;Decreased activity tolerance;Decreased balance;Decreased coordination;Decreased mobility;Decreased knowledge of use of DME;Decreased safety awareness;Pain;Cardiopulmonary status limiting activity;Decreased knowledge of precautions       PT Treatment Interventions DME instruction;Gait training;Stair training;Functional mobility training;Therapeutic activities;Therapeutic exercise;Balance training;Neuromuscular re-education;Patient/family education    PT Goals (Current goals can be found in the Care Plan section)  Acute Rehab PT Goals Patient Stated Goal: get back to walking PT Goal Formulation: With patient Time For Goal Achievement: 11/05/16 Potential to Achieve Goals: Fair    Frequency Min 2X/week   Barriers to discharge        Co-evaluation               AM-PAC PT "6 Clicks" Daily Activity  Outcome Measure Difficulty turning over in bed (including adjusting bedclothes, sheets and blankets)?: A Little Difficulty moving from lying on back to sitting on the side of the bed? : A Little Difficulty sitting down on and standing up from a chair with arms (e.g., wheelchair, bedside commode, etc,.)?: Total Help needed moving to and from a bed to chair (including a wheelchair)?: A Lot Help needed walking in hospital room?: Total Help needed climbing 3-5 steps with a railing? : Total 6 Click Score: 11    End of Session Equipment Utilized During Treatment: Gait belt Activity Tolerance: Patient limited by pain;Patient limited by fatigue Patient left: with bed alarm set;with call bell/phone within reach Nurse Communication: Mobility status PT Visit Diagnosis: Muscle weakness (generalized) (M62.81);Difficulty in walking, not elsewhere classified  (R26.2);Unsteadiness on feet (R26.81)    Time: 1245-8099 PT Time Calculation (min) (ACUTE ONLY): 27 min   Charges:   PT Evaluation $PT Eval Low Complexity: 1 Procedure PT Treatments $Therapeutic Activity: 8-22 mins   PT G Codes:   PT G-Codes **NOT FOR INPATIENT CLASS** Functional Assessment Tool Used: AM-PAC 6 Clicks Basic Mobility Functional Limitation: Mobility: Walking and moving around Mobility: Walking and Moving Around Current Status (I3382): At least 60 percent but less than 80 percent impaired, limited or restricted Mobility: Walking and Moving Around Goal Status [phone]): At least 20 percent but less than 40 percent impaired, limited or restricted    Kreg Shropshire, DPT 10/22/2016, 10:39 AM

## 2016-10-22 NOTE — Evaluation (Signed)
Occupational Therapy Evaluation Patient Details Name: [name] MRN: [id number] DOB: 14-Sep-1934 Today's Date: 10/22/2016    History of Present Illness Pt. is a 82 y.o. male who was admitted to [facility] following multiple Falls at home after having recent toe amputations on 10/15/2016. Pt. PMHX includes: A-Fib, DM, PAD, Chronic leg pain, leukocytosis, and acute renal insufficiency.   Clinical Impression   Pt. Presents with 8-10/10 pain in his Left LE, weakness, decreased activity tolerance, and impaired functional mobility which hinder his ability to complete ADL, and IADL functioning. Pt. could benefit from skilled OT services for ADL training, A/E training, UE therapeutic ex, energy conservation, and pt. education about home modification, and DME. Pt. Could benefit from SNF upon discharge with follow-up OT services.    Follow Up Recommendations  SNF    Equipment Recommendations       Recommendations for Other Services       Precautions / Restrictions Precautions Precautions: Fall Restrictions Weight Bearing Restrictions: Yes LLE Weight Bearing: Weight bearing as tolerated (with orthowedge boot)             ADL either performed or assessed with clinical judgement   ADL Overall ADL's : Needs assistance/impaired Eating/Feeding: Set up   Grooming: Set up   Upper Body Bathing: Minimal assistance   Lower Body Bathing: Moderate assistance   Upper Body Dressing : Minimal assistance   Lower Body Dressing: Moderate assistance               Functional mobility during ADLs: Minimal assistance General ADL Comments: Pt. education was provided about A/E use for LE ADLs.     Vision Patient Visual Report: No change from baseline       Perception     Praxis      Pertinent Vitals/Pain Pain Assessment: 0-10 Pain Score: 8  (10/10 with movement) Pain Location: Left LE Pain Descriptors / Indicators: Aching;Burning Pain Intervention(s): Limited activity within  patient's tolerance;Monitored during session;Patient requesting pain meds-RN notified;RN gave pain meds during session     Hand Dominance Right   Extremity/Trunk Assessment Upper Extremity Assessment Upper Extremity Assessment: Overall WFL for tasks assessed         Communication Communication Communication: No difficulties   Cognition Arousal/Alertness: Awake/alert Behavior During Therapy: WFL for tasks assessed/performed Overall Cognitive Status: Within Functional Limits for tasks assessed                                     General Comments       Exercises     Shoulder Instructions      Home Living Family/patient expects to be discharged to:: Private residence Living Arrangements: Spouse/significant other Available Help at Discharge: Family;Available 24 hours/day Type of Home: House Home Access: Level entry     Home Layout: One level     Bathroom Shower/Tub: Occupational psychologist: Standard     Home Equipment: Environmental consultant - 2 wheels;Shower seat - built in          Prior Functioning/Environment Level of Independence: Independent with assistive device(s)        Comments: prior to amputations pt was able to ambualte w/o AD and do all he needed, had started using walker more recently.  Has been largely immobile the last week        OT Problem List: Decreased strength;Decreased coordination;Pain;Decreased activity tolerance;Decreased knowledge of use of DME or AE  OT Treatment/Interventions: Self-care/ADL training;Therapeutic exercise;Energy conservation;DME and/or AE instruction;Patient/family education;Therapeutic activities    OT Goals(Current goals can be found in the care plan section) Acute Rehab OT Goals Patient Stated Goal: To return home OT Goal Formulation: With patient Potential to Achieve Goals: Good  OT Frequency: Min 2X/week   Barriers to D/C:            Co-evaluation              AM-PAC PT "6  Clicks" Daily Activity     Outcome Measure Help from another person eating meals?: None Help from another person taking care of personal grooming?: None Help from another person toileting, which includes using toliet, bedpan, or urinal?: A Lot Help from another person bathing (including washing, rinsing, drying)?: A Little Help from another person to put on and taking off regular upper body clothing?: A Little Help from another person to put on and taking off regular lower body clothing?: A Lot 6 Click Score: 18   End of Session    Activity Tolerance: Patient tolerated treatment well Patient left: in chair;with call bell/phone within reach;with chair alarm set  OT Visit Diagnosis: Muscle weakness (generalized) (M62.81)                Time: 3009-2330 OT Time Calculation (min): 18 min Charges:  OT General Charges $OT Visit: 1 Procedure OT Evaluation $OT Eval Moderate Complexity: 1 Procedure G-Codes: OT G-codes **NOT FOR INPATIENT CLASS** Functional Limitation: Self care Self Care Current Status (Q7622): At least 20 percent but less than 40 percent impaired, limited or restricted Self Care Goal Status (Q3335): At least 1 percent but less than 20 percent impaired, limited or restricted  Harrel Carina, MS, OTR/L Harrel Carina, MS, OTR/L 10/22/2016, 10:43 AM

## 2016-10-22 NOTE — Progress Notes (Signed)
SLP Cancellation Note  Patient Details Name: [name] MRN: [id number] DOB: 1934-12-31   Cancelled treatment:       Reason Eval/Treat Not Completed: SLP screened, no needs identified, will sign off (chart reviewed; NSG and MD consulted re: pt's status). Pt denied any difficulty swallowing and is currently on a regular diet; tolerates swallowing pills w/ water per NSG. Pt conversed at conversational level w/out significant deficits noted; pt denied any speech-language deficits but noted pt often chuckled/laughed and exhibited min vagueness in his answers to SLP - noted Head CT results of mild to moderate diffuse cerebral atrophy and chronic small vessel white matter ischemic changes in both cerebral hemispheres. No further skilled ST services indicated as pt appears at his baseline - but would recommend f/u w/ Neurology for a formal Cognitive assessment when not in the setting of acute illness. Pt agreed. NSG updated and agreed to reconsult if any change in status.     Orinda Kenner, MS, CCC-SLP Ashunti Schofield 10/22/2016, 10:40 AM

## 2016-10-22 NOTE — Progress Notes (Signed)
Pt admitted with multiple falls. S/P left foot surgery with amputation of  4th and 5th  toes and graft placement. Incision is coapted.  Lateral 1/2 of incision has mild superficial necrosis without infection or drainage. Dresssing covering apligraf left intact. New dressing applied and recommend leaving intact.  Can change prn breakthrough drainage. Has f/u with Dr. Elvina Mattes next week. Should maintain NWB to left foot.

## 2016-10-22 NOTE — Progress Notes (Signed)
Pharmacy Antibiotic Note  [name] is a 82 y.o. male admitted on 10/21/2016 with cellulitis.  Pharmacy has been consulted for vancomycin dosing.  Plan: DW 74kg  Vd 52L kei 0.048 hr-1  T1/2 14 hours Vancomycin 1 gram q 18 hours ordered with stacked dosing. Level before 5th dose. Goal trough 10-15.  Height: 5\' 7"  (170.2 cm) Weight: 190 lb (86.2 kg) IBW/kg (Calculated) : 66.1  Temp (24hrs), Avg:98.1 F (36.7 C), Min:98 F (36.7 C), Max:98.2 F (36.8 C)   Recent Labs Lab 10/21/16 2028  WBC 15.2*  CREATININE 1.18    Estimated Creatinine Clearance: 53.2 mL/min (by C-G formula based on SCr of 1.18 mg/dL).    Allergies  Allergen Reactions  . Contrast Media [Iodinated Diagnostic Agents] Shortness Of Breath    SOB  . Iohexol      Desc: Respiratory Distress, Laryngedema, diaphoresis, Onset Date: [id number]   . Latex Rash    Antimicrobials this admission: Vancomycin 5/31  >>    >>   Dose adjustments this admission:   Microbiology results: No micro   Thank you for allowing pharmacy to be a part of this patient's care.  Ewen Varnell S 10/22/2016 12:58 AM

## 2016-10-22 NOTE — Progress Notes (Addendum)
ANTICOAGULATION CONSULT NOTE - Initial Consult  Pharmacy Consult for warfarin dosing Indication: atrial fibrillation  Allergies  Allergen Reactions  . Contrast Media [Iodinated Diagnostic Agents] Shortness Of Breath    SOB  . Iohexol      Desc: Respiratory Distress, Laryngedema, diaphoresis, Onset Date: [id number]   . Latex Rash    Patient Measurements: Height: 5\' 7"  (170.2 cm) Weight: 190 lb (86.2 kg) IBW/kg (Calculated) : 66.1 Heparin Dosing Weight: n/a  Vital Signs: Temp: 98.3 F (36.8 C) (06/01 0446) Temp Source: Oral (06/01 0446) BP: 117/70 (06/01 0446) Pulse Rate: 40 (06/01 0446)  Labs:  Recent Labs  10/21/16 2028 10/22/16 0522  HGB 13.9  --   HCT 42.3  --   PLT 298  --   LABPROT  --  51.4*  INR  --  5.47*  CREATININE 1.18  --   TROPONINI <0.03  --     Estimated Creatinine Clearance: 53.2 mL/min (by C-G formula based on SCr of 1.18 mg/dL).   Medical History: Past Medical History:  Diagnosis Date  . Arthritis   . Atrial fibrillation ([place])   . Carcinoma of prostate ([place])   . CHF (congestive heart failure) ([place])   . Chronic kidney disease   . Diabetes mellitus without complication ([place])   . ED (erectile dysfunction)   . Frequent urination   . Hyperlipidemia   . Hypertension   . Moderate mitral insufficiency   . Peripheral vascular disease ([place])   . Pneumonia 04/2016  . Prostate cancer ([place])   . Sleep apnea    OSA--USE C-PAP  . Ulcer of left foot due to type 2 diabetes mellitus ([place])   . Urinary stress incontinence, male     Medications:  Home warfarin dose 5 mg daily per med rec.   Assessment: INR supratherapeutic on admission. No bleeding issues per RN.  Goal of Therapy:  INR 2-3    Plan:  Will hold warfarin dose for now. Daily INR while on antibiotics.   Shlonda Dolloff S 10/22/2016,6:25 AM

## 2016-10-22 NOTE — Clinical Social Work Placement (Signed)
   CLINICAL SOCIAL WORK PLACEMENT  NOTE  Date:  10/22/2016  Patient Details  Name: [name] MRN: [id number] Date of Birth: [birth year]  Clinical Social Work is seeking post-discharge placement for this patient at the [place] level of care (*CSW will initial, date and re-position this form in  chart as items are completed):  Yes   Patient/family provided with [facility] Work Department's list of facilities offering this level of care within the geographic area requested by the patient (or if unable, by the patient's family).  Yes   Patient/family informed of their freedom to choose among providers that offer the needed level of care, that participate in Medicare, Medicaid or managed care program needed by the patient, have an available bed and are willing to accept the patient.  Yes   Patient/family informed of [facility]'s ownership interest in [facility] and [facility], as well as of the fact that they are under no obligation to receive care at these facilities.  PASRR submitted to EDS on       PASRR number received on       Existing PASRR number confirmed on 10/22/16     FL2 transmitted to all facilities in geographic area requested by pt/family on 10/22/16     FL2 transmitted to all facilities within larger geographic area on       Patient informed that his/her managed care company has contracts with or will negotiate with certain facilities, including the following:            Patient/family informed of bed offers received.  Patient chooses bed at       Physician recommends and patient chooses bed at      Patient to be transferred to   on  .  Patient to be transferred to facility by       Patient family notified on   of transfer.  Name of family member notified:        PHYSICIAN       Additional Comment:    _______________________________________________ Zenovia Justman, Veronia Beets, LCSW 10/22/2016, 1:39 PM

## 2016-10-22 NOTE — Care Management Obs Status (Signed)
[place] NOTIFICATION   Patient Details  Name: [name] MRN: [id number] Date of Birth: 05-05-1935   Medicare Observation Status Notification Given:  Yes    Shelbie Ammons, RN 10/22/2016, 11:37 AM

## 2016-10-22 NOTE — Consult Note (Signed)
[facility] Vascular Consult Note  MRN : [id number]  [name] is a 82 y.o. (Apr 26, 1935) male who presents with chief complaint of  Chief Complaint  Patient presents with  . Fall  .  History of Present Illness: I am asked to see the patient by Dr. Ether Griffins for left leg pain.  I have treated him for PAD and he has had three angiograms with revascularization on the left leg this year.  He had three toes amputated recently, and see by Podiatry today and this is doing well.  His foot is warm and his pain is mostly in the upper leg and hip area.  He reports weakness in lifting his left leg and he has fallen several times.  He has no fever or chills.  He is now going to rehab as he is not safe at home with multiple falls.  Current Facility-Administered Medications  Medication Dose Route Frequency Provider Last Rate Last Dose  . acetaminophen (TYLENOL) tablet 650 mg  650 mg Oral Q4H PRN Lance Coon, MD       Or  . acetaminophen (TYLENOL) solution 650 mg  650 mg Per Tube Q4H PRN Lance Coon, MD       Or  . acetaminophen (TYLENOL) suppository 650 mg  650 mg Rectal Q4H PRN Lance Coon, MD      . aspirin EC tablet 81 mg  81 mg Oral Daily Lance Coon, MD   81 mg at 10/22/16 8588  . atorvastatin (LIPITOR) tablet 80 mg  80 mg Oral q1800 Lance Coon, MD      . diltiazem (CARDIZEM CD) 24 hr capsule 300 mg  300 mg Oral Daily Lance Coon, MD   300 mg at 10/22/16 5027  . furosemide (LASIX) tablet 20 mg  20 mg Oral Daily Lance Coon, MD   20 mg at 10/22/16 7412  . gabapentin (NEURONTIN) capsule 200 mg  200 mg Oral TID Lance Coon, MD   200 mg at 10/22/16 8786  . insulin aspart (novoLOG) injection 0-5 Units  0-5 Units Subcutaneous QHS Lance Coon, MD      . insulin aspart (novoLOG) injection 0-9 Units  0-9 Units Subcutaneous TID WC Lance Coon, MD   1 Units at 10/22/16 1306  . metoprolol tartrate (LOPRESSOR) tablet 100 mg  100 mg Oral BID Lance Coon, MD    100 mg at 10/22/16 7672  . morphine 2 MG/ML injection 2 mg  2 mg Intravenous Q4H PRN Harrie Foreman, MD   2 mg at 10/22/16 0934  . oxyCODONE-acetaminophen (PERCOCET) 7.5-325 MG per tablet 1 tablet  1 tablet Oral Q4H PRN Lance Coon, MD   1 tablet at 10/22/16 1340  . vancomycin (VANCOCIN) IVPB 1000 mg/200 mL premix  1,000 mg Intravenous Q18H Lance Coon, MD   Stopped at 10/22/16 0500    Past Medical History:  Diagnosis Date  . Arthritis   . Atrial fibrillation ([place])   . Carcinoma of prostate ([place])   . CHF (congestive heart failure) ([place])   . Chronic kidney disease   . Diabetes mellitus without complication ([place])   . ED (erectile dysfunction)   . Frequent urination   . Hyperlipidemia   . Hypertension   . Moderate mitral insufficiency   . Peripheral vascular disease ([place])   . Pneumonia 04/2016  . Prostate cancer ([place])   . Sleep apnea    OSA--USE C-PAP  . Ulcer of left foot due to type 2 diabetes mellitus ([place])   .  Urinary stress incontinence, male     Past Surgical History:  Procedure Laterality Date  . APLIGRAFT PLACEMENT Left 10/15/2016   Procedure: APLIGRAFT PLACEMENT;  Surgeon: Albertine Patricia, DPM;  Location: [facility] ORS;  Service: Podiatry;  Laterality: Left;  necrotic ulcer  . CHOLECYSTECTOMY    . EYE SURGERY Bilateral    Cataract Extraction with IOL  . IRRIGATION AND DEBRIDEMENT FOOT Left 10/15/2016   Procedure: IRRIGATION AND DEBRIDEMENT FOOT-EXCISIONAL DEBRIDEMENT OF SKIN 3RD, 4TH AND 5TH TOES WITH APPLICATION OF APLIGRAFT;  Surgeon: Albertine Patricia, DPM;  Location: [facility] ORS;  Service: Podiatry;  Laterality: Left;  necrotic,gangrene  . LOWER EXTREMITY ANGIOGRAPHY Left 08/16/2016   Procedure: Lower Extremity Angiography;  Surgeon: Algernon Huxley, MD;  Location: [place] [facility];  Service: Cardiovascular;  Laterality: Left;  . LOWER EXTREMITY ANGIOGRAPHY Left 09/01/2016   Procedure: Lower Extremity Angiography;  Surgeon: Algernon Huxley, MD;  Location: [place] [facility]  [facility];  Service: Cardiovascular;  Laterality: Left;  . LOWER EXTREMITY ANGIOGRAPHY Left 10/14/2016   Procedure: Lower Extremity Angiography;  Surgeon: Algernon Huxley, MD;  Location: [place] [facility];  Service: Cardiovascular;  Laterality: Left;  . LOWER EXTREMITY INTERVENTION  09/01/2016   Procedure: Lower Extremity Intervention;  Surgeon: Algernon Huxley, MD;  Location: [place] [facility];  Service: Cardiovascular;;  . PROSTATE SURGERY     removal  . TONSILLECTOMY     as a child    Social History Social History  Substance Use Topics  . Smoking status: Former Smoker    Packs/day: 0.25    Types: Cigarettes    Quit date: 10/14/1994  . Smokeless tobacco: Never Used  . Alcohol use No  married  Family History Family History  Problem Relation Age of Onset  . Hypertension Mother   . Prostate cancer Neg Hx   . Bladder Cancer Neg Hx   . Kidney disease Neg Hx   no bleeding or clotting disorders  Allergies  Allergen Reactions  . Contrast Media [Iodinated Diagnostic Agents] Shortness Of Breath    SOB  . Iohexol      Desc: Respiratory Distress, Laryngedema, diaphoresis, Onset Date: [id number]   . Latex Rash     REVIEW OF SYSTEMS (Negative unless checked)  Constitutional: [] Weight loss  [] Fever  [] Chills Cardiac: [] Chest pain   [] Chest pressure   [x] Palpitations   [] Shortness of breath when laying flat   [] Shortness of breath at rest   [] Shortness of breath with exertion. Vascular:  [] Pain in legs with walking   [] Pain in legs at rest   [] Pain in legs when laying flat   [] Claudication   [] Pain in feet when walking  [] Pain in feet at rest  [] Pain in feet when laying flat   [] History of DVT   [] Phlebitis   [] Swelling in legs   [] Varicose veins   [x] Non-healing ulcers Pulmonary:   [] Uses home oxygen   [] Productive cough   [] Hemoptysis   [] Wheeze  [] COPD   [] Asthma Neurologic:  [x] Dizziness  [] Blackouts   [] Seizures   [] History of stroke   [] History of TIA  [] Aphasia   [] Temporary blindness    [] Dysphagia   [] Weakness or numbness in arms   [x] Weakness or numbness in legs Musculoskeletal:  [] Arthritis   [] Joint swelling   [] Joint pain   [] Low back pain Hematologic:  [] Easy bruising  [] Easy bleeding   [] Hypercoagulable state   [] Anemic  [] Hepatitis Gastrointestinal:  [] Blood in stool   [] Vomiting blood  [] Gastroesophageal reflux/heartburn   []   Difficulty swallowing. Genitourinary:  [] Chronic kidney disease   [] Difficult urination  [] Frequent urination  [] Burning with urination   [] Blood in urine Skin:  [] Rashes   [x] Ulcers   [x] Wounds Psychological:  [] History of anxiety   []  History of major depression.  Physical Examination  Vitals:   10/22/16 0044 10/22/16 0446 10/22/16 0735 10/22/16 0927  BP: (!) 153/97 117/70 (!) 133/91 (!) 143/65  Pulse: (!) 101 (!) 40 91 70  Resp:  16 20 (!) 22  Temp: 98 F (36.7 C) 98.3 F (36.8 C) 98 F (36.7 C) 98.2 F (36.8 C)  TempSrc: Oral Oral Oral Oral  SpO2: 97% 93% 94% 97%  Weight:      Height:       Body mass index is 29.76 kg/m. Gen:  WD/WN, NAD Head: [place]/AT, No temporalis wasting.  Ear/Nose/Throat: Hearing grossly intact, nares w/o erythema or drainage, oropharynx w/o Erythema/Exudate Eyes: Sclera non-icteric, conjunctiva clear Neck: Trachea midline.  No JVD.  Pulmonary:  Good air movement, respirations not labored, equal bilaterally.  Cardiac: irregularly irregular Vascular:  Vessel Right Left  Radial Palpable Palpable  Ulnar Palpable Palpable  Brachial Palpable Palpable  Carotid Palpable, without bruit Palpable, without bruit  Aorta Not palpable N/A  Femoral Palpable Palpable  Popliteal Palpable Palpable  PT Palpable 1+ Palpable  DP 1+ Palpable 1+ Palpable   Gastrointestinal: soft, non-tender/non-distended. No guarding/reflex.  Musculoskeletal: M/S 5/5 throughout. Foot dressed on the left.  Good capillary refill and warm bilaterally Neurologic: Sensation grossly intact in extremities.  Symmetrical.  Speech is fluent. Motor  exam as listed above. Psychiatric: seems mildly confused which is his baseline. Dermatologic: left foot toe amputation site dressed.       CBC Lab Results  Component Value Date   WBC 15.2 (H) 10/21/2016   HGB 13.9 10/21/2016   HCT 42.3 10/21/2016   MCV 85.7 10/21/2016   PLT 298 10/21/2016    BMET    Component Value Date/Time   NA 135 10/21/2016 2028   NA 140 01/20/2013 0255   K 3.8 10/21/2016 2028   K 3.5 01/20/2013 0255   CL 99 (L) 10/21/2016 2028   CL 103 01/20/2013 0255   CO2 23 10/21/2016 2028   CO2 30 01/20/2013 0255   GLUCOSE 162 (H) 10/21/2016 2028   GLUCOSE 145 (H) 01/20/2013 0255   BUN 29 (H) 10/21/2016 2028   BUN 22 (H) 01/20/2013 0255   CREATININE 1.18 10/21/2016 2028   CREATININE 1.09 01/20/2013 0255   CALCIUM 8.7 (L) 10/21/2016 2028   CALCIUM 9.4 01/20/2013 0255   GFRNONAA 57 (L) 10/21/2016 2028   GFRNONAA >60 01/20/2013 0255   GFRAA >60 10/21/2016 2028   GFRAA >60 01/20/2013 0255   Estimated Creatinine Clearance: 53.2 mL/min (by C-G formula based on SCr of 1.18 mg/dL).  COAG Lab Results  Component Value Date   INR 5.47 (HH) 10/22/2016   INR 3.12 10/14/2016   INR 2.09 09/06/2016    Radiology Ct Head Wo Contrast  Result Date: 10/21/2016 CLINICAL DATA:  Ataxia following foot surgery resulting in a fall today. EXAM: CT HEAD WITHOUT CONTRAST TECHNIQUE: Contiguous axial images were obtained from the base of the skull through the vertex without intravenous contrast. COMPARISON:  None. FINDINGS: Brain: Diffusely enlarged ventricles and subarachnoid spaces. Patchy white matter low density in both cerebral hemispheres. No intracranial hemorrhage, mass lesion or CT evidence of acute infarction. Vascular: No hyperdense vessel or unexpected calcification. Skull: Normal. Negative for fracture or focal lesion. Sinuses/Orbits: Unremarkable. Other: None.  IMPRESSION: No acute abnormality. Mild to moderate diffuse cerebral atrophy and minimal chronic small vessel  white matter ischemic changes in both cerebral hemispheres. Electronically Signed   By: Claudie Revering M.D.   On: 10/21/2016 20:20   US Venous Img Lower Unilateral Left  Result Date: 10/22/2016 CLINICAL DATA:  Left thigh pain for 3 months. EXAM: Left LOWER EXTREMITY VENOUS DOPPLER ULTRASOUND TECHNIQUE: Gray-scale sonography with graded compression, as well as color Doppler and duplex ultrasound were performed to evaluate the lower extremity deep venous systems from the level of the common femoral vein and including the common femoral, femoral, profunda femoral, popliteal and calf veins including the posterior tibial, peroneal and gastrocnemius veins when visible. The superficial great saphenous vein was also interrogated. Spectral Doppler was utilized to evaluate flow at rest and with distal augmentation maneuvers in the common femoral, femoral and popliteal veins. COMPARISON:  None. FINDINGS: Contralateral Common Femoral Vein: Respiratory phasicity is normal and symmetric with the symptomatic side. No evidence of thrombus. Normal compressibility. Common Femoral Vein: No evidence of thrombus. Normal compressibility, respiratory phasicity and response to augmentation. Saphenofemoral Junction: No evidence of thrombus. Normal compressibility and flow on color Doppler imaging. Profunda Femoral Vein: No evidence of thrombus. Normal compressibility and flow on color Doppler imaging. Femoral Vein: No evidence of thrombus. Normal compressibility, respiratory phasicity and response to augmentation. Popliteal Vein: No evidence of thrombus. Normal compressibility, respiratory phasicity and response to augmentation. Calf Veins: No evidence of thrombus. Normal compressibility and flow on color Doppler imaging. Venous Reflux:  None. Other Findings:  None. IMPRESSION: No evidence of DVT within the left lower extremity. Electronically Signed   By: Marijo Conception, M.D.   On: 10/22/2016 14:25   Dg Foot Complete Left  Result  Date: 10/21/2016 CLINICAL DATA:  Patient lost balance and fell today. Recent foot surgery. EXAM: LEFT FOOT - COMPLETE 3+ VIEW COMPARISON:  None. FINDINGS: Three-view exam of the left foot shows amputation of the third, fourth, and fifth toes. No fracture. No subluxation or dislocation. Vascular calcification suggest diabetes. IMPRESSION: Status post 3 toe amputation.  No acute findings. Electronically Signed   By: Misty Stanley M.D.   On: 10/21/2016 20:43      Assessment/Plan 1. PAD with multiple interventions.  This is not the cause of his leg and thigh pain.  Perfusion appears intact.  Would continue his current medications.  Outpatient follow up 2. Left leg pain and weakness.  Sciatica versus trauma to his hip/pelvis/thigh from falls? Unlikely vascular in origin.  No bruising and no signs of hematoma or swelling 3. A. Fib. Control is difficult and he has had multiple issues with this.  On anticoagulation 4. DM. blood glucose control important in reducing the progression of atherosclerotic disease. Also, involved in wound healing. On appropriate medications.    Leotis Pain, MD  10/22/2016 2:45 PM    This note was created with Dragon medical transcription system.  Any error is purely unintentional

## 2016-10-22 NOTE — Care Management (Signed)
Admitted to [facility] under observation status with the diagnosis of ataxia. Lives with wife, Everlene Farrier [phone]). Will be seeing a primary care physician at [facility] in [place].  Prescriptions are filled per [facility]. Home Health is in the home, unsure about name of agency. Discharged from this facility to [facility] 09/06/16. States he was at [facility] 4-5 weeks. No home oxygen. Rolling walker in the home. No falls lately. Fair appetite. Self feeds, but needs help with dressing and bathing. Physical therapy evaluation completed. Recommending skilled Nursing facility. [name] is in agreement with this disposition. MRI scheduled for today Shelbie Ammons RN MSN CCM Care Management [phone]

## 2016-10-23 ENCOUNTER — Observation Stay: Payer: Medicare Other

## 2016-10-23 ENCOUNTER — Observation Stay: Admit: 2016-10-23 | Payer: Medicare Other

## 2016-10-23 LAB — URINALYSIS, COMPLETE (UACMP) WITH MICROSCOPIC
Bacteria, UA: NONE SEEN
Bilirubin Urine: NEGATIVE
GLUCOSE, UA: NEGATIVE mg/dL
Hgb urine dipstick: NEGATIVE
KETONES UR: 5 mg/dL — AB
Leukocytes, UA: NEGATIVE
NITRITE: NEGATIVE
PH: 5 (ref 5.0–8.0)
Protein, ur: 30 mg/dL — AB
Specific Gravity, Urine: 1.025 (ref 1.005–1.030)

## 2016-10-23 LAB — GLUCOSE, CAPILLARY
GLUCOSE-CAPILLARY: 121 mg/dL — AB (ref 65–99)
GLUCOSE-CAPILLARY: 126 mg/dL — AB (ref 65–99)
GLUCOSE-CAPILLARY: 244 mg/dL — AB (ref 65–99)
Glucose-Capillary: 104 mg/dL — ABNORMAL HIGH (ref 65–99)

## 2016-10-23 LAB — HEPATIC FUNCTION PANEL
ALBUMIN: 2.8 g/dL — AB (ref 3.5–5.0)
ALK PHOS: 269 U/L — AB (ref 38–126)
ALT: 54 U/L (ref 17–63)
AST: 32 U/L (ref 15–41)
BILIRUBIN TOTAL: 1.2 mg/dL (ref 0.3–1.2)
Bilirubin, Direct: 0.2 mg/dL (ref 0.1–0.5)
Indirect Bilirubin: 1 mg/dL — ABNORMAL HIGH (ref 0.3–0.9)
TOTAL PROTEIN: 6.4 g/dL — AB (ref 6.5–8.1)

## 2016-10-23 LAB — PROTIME-INR
INR: 3.06
Prothrombin Time: 32.3 seconds — ABNORMAL HIGH (ref 11.4–15.2)

## 2016-10-23 LAB — HEMOGLOBIN A1C
HEMOGLOBIN A1C: 8.5 % — AB (ref 4.8–5.6)
MEAN PLASMA GLUCOSE: 197 mg/dL

## 2016-10-23 MED ORDER — WARFARIN - PHARMACIST DOSING INPATIENT: Freq: Every day | Status: DC

## 2016-10-23 MED ORDER — WARFARIN SODIUM 5 MG PO TABS
2.5000 mg | ORAL_TABLET | Freq: Once | ORAL | Status: AC
  Administered 2016-10-23: 2.5 mg via ORAL
  Filled 2016-10-23: qty 1

## 2016-10-23 NOTE — Progress Notes (Signed)
ANTICOAGULATION CONSULT NOTE - Follow Up Consult  Pharmacy Consult for warfarin dosing Indication: atrial fibrillation  Allergies  Allergen Reactions  . Contrast Media [Iodinated Diagnostic Agents] Shortness Of Breath    SOB  . Iohexol      Desc: Respiratory Distress, Laryngedema, diaphoresis, Onset Date: [id number]   . Latex Rash    Patient Measurements: Height: 5\' 7"  (170.2 cm) Weight: 190 lb (86.2 kg) IBW/kg (Calculated) : 66.1 Heparin Dosing Weight: n/a  Vital Signs: Temp: 98.2 F (36.8 C) (06/02 0432) Temp Source: Oral (06/02 0432) BP: 124/86 (06/02 0432) Pulse Rate: 75 (06/02 0432)  Labs:  Recent Labs  10/21/16 2028 10/22/16 0522 10/22/16 0945 10/23/16 0456  HGB 13.9  --   --   --   HCT 42.3  --   --   --   PLT 298  --   --   --   LABPROT  --  51.4*  --  32.3*  INR  --  5.47*  --  3.06  CREATININE 1.18  --   --   --   CKTOTAL  --   --  131  --   TROPONINI <0.03  --   --   --     Estimated Creatinine Clearance: 53.2 mL/min (by C-G formula based on SCr of 1.18 mg/dL).   Medical History: Past Medical History:  Diagnosis Date  . Arthritis   . Atrial fibrillation ([place])   . Carcinoma of prostate ([place])   . CHF (congestive heart failure) ([place])   . Chronic kidney disease   . Diabetes mellitus without complication ([place])   . ED (erectile dysfunction)   . Frequent urination   . Hyperlipidemia   . Hypertension   . Moderate mitral insufficiency   . Peripheral vascular disease ([place])   . Pneumonia 04/2016  . Prostate cancer ([place])   . Sleep apnea    OSA--USE C-PAP  . Ulcer of left foot due to type 2 diabetes mellitus ([place])   . Urinary stress incontinence, male     Medications:  Home warfarin dose 5 mg daily per med rec.   Assessment: INR slightly above goal range; however down from 5.47 to 3.06.No bleeding issues per RN.  Goal of Therapy:  INR 2-3    Plan:  Will give warfarin 2.5 mg (50% of warfarin home dose). Will recheck INR with am labs.     Kiosha Buchan D 10/23/2016,7:23 AM

## 2016-10-23 NOTE — Progress Notes (Signed)
Pt's HR beginning to sustain >100. PRN cardizem given per order, continue to monitor.

## 2016-10-23 NOTE — Clinical Social Work Note (Signed)
CSW spoke with attending MD concerning patient's discharge planning. The patient will discharge on 6/3 to [facility]. CSW updated [facility] of the change in discharge plan from today to tomorrow. CSW will continue to follow.  Santiago Bumpers, MSW, Latanya Presser [phone]

## 2016-10-23 NOTE — Progress Notes (Signed)
Physical Therapy Treatment Patient Details Name: [name] MRN: [id number] DOB: Aug 10, 1934 Today's Date: 10/23/2016    History of Present Illness Pt. is a 82 y.o. male who was admitted to [facility] following multiple Falls at home after having recent toe amputations on 10/15/2016. Pt. PMHX includes: A-Fib, DM, PAD, Chronic leg pain, leukocytosis, and acute renal insufficiency.    PT Comments    Participated in exercises as described below. Shoe and orthro wedge donned for tranfers.  Pt with multiple supine to sit transitions with min guard/encouragement.  On first 2  attempts, pt returned to supine with c/o burning in LLE.  Obtained 2nd assist and he was able to stand with walker and min a x 2 for a short period of time, he placed LLE too far in front of him for a functional/safet step and was instructed to pull it back and take a smaller step.  Upon doing so he sat quickly back on bed.  For general safety of pt and staff, pt then was assisted in squat/stand pivot transfer to chair with min a x 2.  Pt with general fear and pain/buring in LLE with mobility affecting movement quality and safety.  Upon sitting in chair, HR noted to be in 130's and came back down to 110's with rest.  Primary nurse in room with medications.    SNF remains appropriate at this time   Follow Up Recommendations  SNF     Equipment Recommendations       Recommendations for Other Services       Precautions / Restrictions Precautions Precautions: Fall Precaution Comments: watch HR, wedge shoe LLE Restrictions Weight Bearing Restrictions: Yes LLE Weight Bearing: Weight bearing as tolerated    Mobility  Bed Mobility Overal bed mobility: Modified Independent             General bed mobility comments: with encouragement to do without assist, reaches for help but is able to do with encouragement  Transfers Overall transfer level: Needs assistance   Transfers: Squat Pivot Transfers Sit to Stand: Min  assist;+2 physical assistance         General transfer comment: unable to step with walker today, stand/dquat pivot transfer performed  Ambulation/Gait             General Gait Details: attemtped to step with walker to recliner but unable, took very large step with LLE, education to take smaller steps but pt sat back down on bed   Stairs            Wheelchair Mobility    Modified Rankin (Stroke Patients Only)       Balance Overall balance assessment: Needs assistance Sitting-balance support: Single extremity supported Sitting balance-Leahy Scale: Good     Standing balance support: Bilateral upper extremity supported Standing balance-Leahy Scale: Poor Standing balance comment: Pt heavily reliant on the walker to maintain balance and ultimately was unsteady, unsafe and lacked confidence                            Cognition Arousal/Alertness: Awake/alert Behavior During Therapy: WFL for tasks assessed/performed Overall Cognitive Status: Within Functional Limits for tasks assessed                                        Exercises Other Exercises Other Exercises: supine ankle pumps, SLR,heel slides and ab/add  x 10 bilaterally    General Comments        Pertinent Vitals/Pain Pain Score: 5  Pain Descriptors / Indicators: Burning;Aching Pain Intervention(s): Limited activity within patient's tolerance    Home Living                      Prior Function            PT Goals (current goals can now be found in the care plan section)      Frequency    Min 2X/week      PT Plan Current plan remains appropriate    Co-evaluation              AM-PAC PT "6 Clicks" Daily Activity  Outcome Measure  Difficulty turning over in bed (including adjusting bedclothes, sheets and blankets)?: A Little Difficulty moving from lying on back to sitting on the side of the bed? : A Little Difficulty sitting down on and  standing up from a chair with arms (e.g., wheelchair, bedside commode, etc,.)?: Total Help needed moving to and from a bed to chair (including a wheelchair)?: A Lot Help needed walking in hospital room?: Total Help needed climbing 3-5 steps with a railing? : Total 6 Click Score: 11    End of Session Equipment Utilized During Treatment: Gait belt Activity Tolerance: Patient limited by pain;Patient limited by fatigue;Treatment limited secondary to medical complications (Comment) Patient left: in chair;with chair alarm set;with nursing/sitter in room;with call bell/phone within reach Nurse Communication: Mobility status       Time: 8003-4917 PT Time Calculation (min) (ACUTE ONLY): 25 min  Charges:  $Therapeutic Exercise: 8-22 mins $Therapeutic Activity: 8-22 mins                    G Codes:       Chesley Noon, PTA 10/23/16, 9:36 AM

## 2016-10-23 NOTE — Progress Notes (Signed)
Occupational Therapy Treatment Patient Details Name: [name] MRN: [id number] DOB: Jun 20, 1934 Today's Date: 10/23/2016    History of present illness Pt. is a 82 y.o. male who was admitted to [facility] following multiple Falls at home after having recent toe amputations on 10/15/2016. Pt. PMHX includes: A-Fib, DM, PAD, Chronic leg pain, leukocytosis, and acute renal insufficiency.   OT comments  Pt seen for OT treatment session this date. Pt in 10/10 pain in L foot, recently had pain medications. Slightly confused but able to follow commands consistently and teach back education provided in pain coping strategies to spouse with min verbal cues. Pt educated in chronic disease mgt strategies and medication mgt strategies to support self-adherence and self-monitoring to maximize health and safety, pt verbalized understanding. Pt performed bed mobility with modified independence, required min assist for transfer from EOB to recliner with RW and verbal cues for hand placement. Continues to be unsteady/unsafe with functional mobility. Pt reported being more comfortable once seated in recliner with BLE elevated on foot rest. Pt continues to benefit from skilled OT services to address noted impairments and functional deficits in order to maximize return to PLOF.    Follow Up Recommendations  SNF    Equipment Recommendations       Recommendations for Other Services      Precautions / Restrictions Precautions Precautions: Fall Precaution Comments: watch HR, wedge shoe LLE Restrictions Weight Bearing Restrictions: Yes LLE Weight Bearing: Weight bearing as tolerated       Mobility Bed Mobility Overal bed mobility: Modified Independent             General bed mobility comments: with encouragement to do without assist, reaches for help but is able to do with encouragement  Transfers Overall transfer level: Needs assistance Equipment used: Rolling walker (2 wheeled) Transfers: Stand  Pivot Transfers Sit to Stand: Min assist;+2 physical assistance Stand pivot transfers: Min assist       General transfer comment: verbal cues for hand placement for RW safety, pt did not bear weight through LLE at all, hopped during stand pivot with min assist to get from EOB>recliner    Balance Overall balance assessment: Needs assistance Sitting-balance support: Single extremity supported Sitting balance-Leahy Scale: Good     Standing balance support: Bilateral upper extremity supported Standing balance-Leahy Scale: Poor Standing balance comment: heavy reliance on RW to maintain balance, unsteady, unsafe                           ADL either performed or assessed with clinical judgement   ADL Overall ADL's : Needs assistance/impaired Eating/Feeding: Set up   Grooming: Set up   Upper Body Bathing: Sitting;Minimal assistance;Set up   Lower Body Bathing: Moderate assistance;Sitting/lateral leans;Sit to/from stand   Upper Body Dressing : Sitting;Set up;Supervision/safety   Lower Body Dressing: Moderate assistance;Sit to/from stand               Functional mobility during ADLs: Minimal assistance;Rolling walker       Vision Patient Visual Report: No change from baseline     Perception     Praxis      Cognition Arousal/Alertness: Awake/alert Behavior During Therapy: WFL for tasks assessed/performed Overall Cognitive Status: Within Functional Limits for tasks assessed                                 General Comments: slightly confused,  disoriented to date, but oriented to self/situation/place; able to follow commands consistently        Exercises Other Exercises Other Exercises: pt/spouse educated in cognitive behavioral pain coping strategies for pain mgt and pt/spouse verbalized understanding, pt able to teach back methods taught with min verbal cues Other Exercises: pt educated in chronic disease mgt and medication mgt strategies  to support better adherence/independence with self management of both diabetes and foot care to minimize risk of infection/injury; pt verbalized understanding   Shoulder Instructions       General Comments      Pertinent Vitals/ Pain       Pain Assessment: 0-10 Pain Score: 10-Worst pain ever Pain Location: L foot Pain Descriptors / Indicators: Aching;Burning;Operative site guarding Pain Intervention(s): Limited activity within patient's tolerance;Monitored during session;Premedicated before session;Repositioned;Utilized relaxation techniques  Home Living                                          Prior Functioning/Environment              Frequency  Min 2X/week        Progress Toward Goals  OT Goals(current goals can now be found in the care plan section)  Progress towards OT goals: Progressing toward goals  Acute Rehab OT Goals Patient Stated Goal: To return home OT Goal Formulation: With patient Potential to Achieve Goals: [place] Discharge plan remains appropriate;Frequency remains appropriate    Co-evaluation                 AM-PAC PT "6 Clicks" Daily Activity     Outcome Measure   Help from another person eating meals?: None Help from another person taking care of personal grooming?: None Help from another person toileting, which includes using toliet, bedpan, or urinal?: A Lot Help from another person bathing (including washing, rinsing, drying)?: A Lot Help from another person to put on and taking off regular upper body clothing?: None Help from another person to put on and taking off regular lower body clothing?: A Lot 6 Click Score: 18    End of Session Equipment Utilized During Treatment: Gait belt;Rolling walker  OT Visit Diagnosis: Muscle weakness (generalized) (M62.81)   Activity Tolerance Patient tolerated treatment well   Patient Left in chair;with call bell/phone within reach;with chair alarm set;with  family/visitor present   Nurse Communication          Time: 6759-1638 OT Time Calculation (min): 24 min  Charges: OT General Charges $OT Visit: 1 Procedure OT Treatments $Self Care/Home Management : 8-22 mins $Therapeutic Activity: 8-22 mins  Jeni Salles, MPH, MS, OTR/L ascom [phone] 10/23/16, 1:15 PM

## 2016-10-23 NOTE — Progress Notes (Signed)
[facility] [facility] NAME: [name]    MR#:  [id number]  DATE OF BIRTH:  [birth year]  SUBJECTIVE:   Patient here due to ataxic gait and recurrent falls. MRI brain is negative for acute stroke. MRA did show some vertebral stenosis. MRI of the lumbar spine showing some L4-L5 canal stenosis with some disc bulging. Patient denies any back pain presently.  REVIEW OF SYSTEMS:    Review of Systems  Constitutional: Negative for chills and fever.  HENT: Negative for congestion and tinnitus.   Eyes: Negative for blurred vision and double vision.  Respiratory: Negative for cough, shortness of breath and wheezing.   Cardiovascular: Negative for chest pain, orthopnea and PND.  Gastrointestinal: Negative for abdominal pain, diarrhea, nausea and vomiting.  Genitourinary: Negative for dysuria and hematuria.  Neurological: Positive for weakness. Negative for dizziness, sensory change and focal weakness.  All other systems reviewed and are negative.   Nutrition: Carb control/Heart healthy Tolerating Diet: Yes Tolerating PT:  Eval noted.   DRUG ALLERGIES:   Allergies  Allergen Reactions  . Contrast Media [Iodinated Diagnostic Agents] Shortness Of Breath    SOB  . Iohexol      Desc: Respiratory Distress, Laryngedema, diaphoresis, Onset Date: [id number]   . Latex Rash    VITALS:  Blood pressure 140/75, pulse (!) 35, temperature 97 F (36.1 C), resp. rate 18, height 5\' 7"  (1.702 m), weight 86.2 kg (190 lb), SpO2 92 %.  PHYSICAL EXAMINATION:   Physical Exam  GENERAL:  82 y.o.-year-old patient lying in bed in no acute distress.  EYES: Pupils equal, round, reactive to light and accommodation. No scleral icterus. Extraocular muscles intact.  HEENT: Head atraumatic, normocephalic. Oropharynx and nasopharynx clear.  NECK:  Supple, no jugular venous distention. No thyroid enlargement, no tenderness.  LUNGS: Normal breath sounds bilaterally, no  wheezing, rales, rhonchi. No use of accessory muscles of respiration.  CARDIOVASCULAR: S1, S2 normal. No murmurs, rubs, or gallops.  ABDOMEN: Soft, nontender, nondistended. Bowel sounds present. No organomegaly or mass.  EXTREMITIES: No cyanosis, clubbing or edema b/l.   Dressing on Left Foot.   NEUROLOGIC: Cranial nerves II through XII are intact. No focal Motor or sensory deficits b/l.  Globally weak PSYCHIATRIC: The patient is alert and oriented x 3.  SKIN: No obvious rash, lesion, or ulcer.    LABORATORY PANEL:   CBC  Recent Labs Lab 10/21/16 2028  WBC 15.2*  HGB 13.9  HCT 42.3  PLT 298   ------------------------------------------------------------------------------------------------------------------  Chemistries   Recent Labs Lab 10/21/16 2028 10/23/16 0456  NA 135  --   K 3.8  --   CL 99*  --   CO2 23  --   GLUCOSE 162*  --   BUN 29*  --   CREATININE 1.18  --   CALCIUM 8.7*  --   AST 64* 32  ALT 93* 54  ALKPHOS 371* 269*  BILITOT 0.8 1.2   ------------------------------------------------------------------------------------------------------------------  Cardiac Enzymes  Recent Labs Lab 10/21/16 2028  TROPONINI <0.03   ------------------------------------------------------------------------------------------------------------------  RADIOLOGY:  Ct Head Wo Contrast  Result Date: 10/21/2016 CLINICAL DATA:  Ataxia following foot surgery resulting in a fall today. EXAM: CT HEAD WITHOUT CONTRAST TECHNIQUE: Contiguous axial images were obtained from the base of the skull through the vertex without intravenous contrast. COMPARISON:  None. FINDINGS: Brain: Diffusely enlarged ventricles and subarachnoid spaces. Patchy white matter low density in both cerebral hemispheres. No intracranial hemorrhage, mass lesion or CT evidence  of acute infarction. Vascular: No hyperdense vessel or unexpected calcification. Skull: Normal. Negative for fracture or focal lesion.  Sinuses/Orbits: Unremarkable. Other: None. IMPRESSION: No acute abnormality. Mild to moderate diffuse cerebral atrophy and minimal chronic small vessel white matter ischemic changes in both cerebral hemispheres. Electronically Signed   By: Claudie Revering M.D.   On: 10/21/2016 20:20   Mr [name] Neck W Wo Contrast  Result Date: 10/22/2016 CLINICAL DATA:  Initial evaluation for ataxia, multiple falls. EXAM: MRI HEAD WITHOUT CONTRAST MRA HEAD WITHOUT CONTRAST MRA NECK WITHOUT AND WITH CONTRAST TECHNIQUE: Multiplanar, multiecho pulse sequences of the brain and surrounding structures were obtained without intravenous contrast. Angiographic images of the Circle of Willis were obtained using MRA technique without intravenous contrast. Angiographic images of the neck were obtained using MRA technique without and with intravenous contrast. Carotid stenosis measurements (when applicable) are obtained utilizing NASCET criteria, using the distal internal carotid diameter as the denominator. CONTRAST:  26mL MULTIHANCE GADOBENATE DIMEGLUMINE 529 MG/ML IV SOLN COMPARISON:  Prior CT from 10/21/2016. FINDINGS: MRI HEAD FINDINGS Diffuse prominence of the CSF containing spaces is compatible with generalized age related cerebral atrophy. Patchy and confluent T2/FLAIR hyperintensity within the periventricular and deep white matter both cerebral hemispheres most consistent with chronic small vessel ischemic disease, mild to moderate nature. Chronic microvascular changes present within the pons. The no abnormal foci of restricted diffusion to suggest acute or subacute ischemia. Gray-white matter differentiation maintained. No encephalomalacia to suggest chronic infarction. No evidence for acute or chronic intracranial hemorrhage. No mass lesion, midline shift or mass effect. Ventricles normal size without evidence for hydrocephalus. No extra-axial fluid collection. Major dural sinuses are grossly patent. Pituitary gland within normal limits.  Midline structures intact and normal. Major intracranial vascular flow voids are maintained. Vertebrobasilar system is diminutive. Mild degenerative thickening noted at the tectorial membrane without significant stenosis. Craniocervical junction otherwise unremarkable. Visualized upper cervical spine within normal limits. Bone marrow signal intensity normal. No scalp soft tissue abnormality. Globes normal soft tissues within normal limits. Patient status post lens extraction bilaterally. Paranasal sinuses and mastoid air cells are clear. Inner ear structures normal. MRA HEAD FINDINGS ANTERIOR CIRCULATION: Study mildly degraded by motion artifact. The Distal cervical segments of the internal carotid arteries are widely patent with antegrade flow. Petrous, cavernous, and supraclinoid segments patent without flow-limiting stenosis. ICA termini widely patent. A1 segments patent bilaterally. Anterior cerebral arteries patent to their distal aspects without stenosis. M1 segments patent without stenosis or occlusion. MCA bifurcations normal. No proximal M2 occlusion. Distal MCA branches demonstrate mild small vessel atheromatous irregularity but are well opacified and fairly symmetric. POSTERIOR CIRCULATION: Vertebral arteries are diminutive, with dominant right vertebral artery. Minimal atheromatous irregularity within the right vertebral artery without flow-limiting stenosis. Left V4 segment is hypoplastic and somewhat attenuated with atheromatous moderate irregularity, but is opacified to the vertebrobasilar junction. Posterior inferior cerebral arteries are patent proximally. Basilar artery diminutive with multifocal atheromatous irregularity with moderate to severe multifocal stenoses at its mid and distal aspect (series 109, image 9). Superior cerebral arteries are patent bilaterally. Fetal type origin of the PCAs supplied via widely patent posterior communicating arteries. Small hypoplastic left P1 segment noted.  PCAs grossly patent to their distal aspects, although not well evaluated due to motion artifact. No aneurysm or other vascular malformation. MRA NECK FINDINGS Visualized aortic arch of normal caliber with normal branch pattern. No high-grade stenosis about the origin of the great vessels. Visualized subclavian arteries widely patent. Right common and internal carotid arteries  are widely patent without high-grade or flow-limiting stenosis. Mild atheromatous irregularity about the right carotid bifurcation without significant stenosis. Left common internal carotid arteries are widely patent without high-grade or flow-limiting stenosis. Mild atheromatous irregularity about the left carotid bifurcation without significant stenosis. Dominant right vertebral artery rises from the right subclavian artery. Origin of the right vertebral artery not well evaluated on this exam, with possible superimposed stenosis not excluded (series 100, image 6). Right V1 and V2 segments are widely patent distally. Right V3 segment not well evaluated, but appears grossly patent on source imaging. Left vertebral artery occluded just distal to its origin, and remains occluded within the neck. Visible flow within the intracranial left V4 segment as detailed on the MRA head portion of this exam. IMPRESSION: MRI HEAD IMPRESSION: 1. No acute intracranial process identified. 2. Generalized age-related cerebral atrophy with mild to moderate chronic small vessel ischemic disease. MRA HEAD IMPRESSION: 1. Fetal type origin of the PCAs with secondary diminutive vertebrobasilar system. 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 Left vertebral artery is occluded within the neck (see below). Intracranial left V4 segment is opacified, which may be via collateralization and/or retrograde flow across the vertebrobasilar junction. Dominant right vertebral artery patent without significant  stenosis. A component of vertebrobasilar insufficiency may be contributing to patient's symptoms. 2. Widely patent anterior circulation without stenosis or large vessel occlusion. 3. Distal small vessel atheromatous irregularity. MRA NECK IMPRESSION: 1. Occlusion of the left vertebral artery at its origin, which remains occluded throughout the neck. Dominant right vertebral artery patent without obvious stenosis, although the origin of the right vertebral artery not well evaluated on this exam. 2. Widely patent carotid artery systems bilaterally without flow-limiting stenosis. Electronically Signed   By: Jeannine Boga M.D.   On: 10/22/2016 14:44   Mr Brain Wo Contrast  Result Date: 10/22/2016 CLINICAL DATA:  Initial evaluation for ataxia, multiple falls. EXAM: MRI HEAD WITHOUT CONTRAST MRA HEAD WITHOUT CONTRAST MRA NECK WITHOUT AND WITH CONTRAST TECHNIQUE: Multiplanar, multiecho pulse sequences of the brain and surrounding structures were obtained without intravenous contrast. Angiographic images of the Circle of Willis were obtained using MRA technique without intravenous contrast. Angiographic images of the neck were obtained using MRA technique without and with intravenous contrast. Carotid stenosis measurements (when applicable) are obtained utilizing NASCET criteria, using the distal internal carotid diameter as the denominator. CONTRAST:  53mL MULTIHANCE GADOBENATE DIMEGLUMINE 529 MG/ML IV SOLN COMPARISON:  Prior CT from 10/21/2016. FINDINGS: MRI HEAD FINDINGS Diffuse prominence of the CSF containing spaces is compatible with generalized age related cerebral atrophy. Patchy and confluent T2/FLAIR hyperintensity within the periventricular and deep white matter both cerebral hemispheres most consistent with chronic small vessel ischemic disease, mild to moderate nature. Chronic microvascular changes present within the pons. The no abnormal foci of restricted diffusion to suggest acute or subacute  ischemia. Gray-white matter differentiation maintained. No encephalomalacia to suggest chronic infarction. No evidence for acute or chronic intracranial hemorrhage. No mass lesion, midline shift or mass effect. Ventricles normal size without evidence for hydrocephalus. No extra-axial fluid collection. Major dural sinuses are grossly patent. Pituitary gland within normal limits. Midline structures intact and normal. Major intracranial vascular flow voids are maintained. Vertebrobasilar system is diminutive. Mild degenerative thickening noted at the tectorial membrane without significant stenosis. Craniocervical junction otherwise unremarkable. Visualized upper cervical spine within normal limits. Bone marrow signal intensity normal. No scalp soft tissue abnormality. Globes normal soft tissues within normal limits. Patient status post  lens extraction bilaterally. Paranasal sinuses and mastoid air cells are clear. Inner ear structures normal. MRA HEAD FINDINGS ANTERIOR CIRCULATION: Study mildly degraded by motion artifact. The Distal cervical segments of the internal carotid arteries are widely patent with antegrade flow. Petrous, cavernous, and supraclinoid segments patent without flow-limiting stenosis. ICA termini widely patent. A1 segments patent bilaterally. Anterior cerebral arteries patent to their distal aspects without stenosis. M1 segments patent without stenosis or occlusion. MCA bifurcations normal. No proximal M2 occlusion. Distal MCA branches demonstrate mild small vessel atheromatous irregularity but are well opacified and fairly symmetric. POSTERIOR CIRCULATION: Vertebral arteries are diminutive, with dominant right vertebral artery. Minimal atheromatous irregularity within the right vertebral artery without flow-limiting stenosis. Left V4 segment is hypoplastic and somewhat attenuated with atheromatous moderate irregularity, but is opacified to the vertebrobasilar junction. Posterior inferior cerebral  arteries are patent proximally. Basilar artery diminutive with multifocal atheromatous irregularity with moderate to severe multifocal stenoses at its mid and distal aspect (series 109, image 9). Superior cerebral arteries are patent bilaterally. Fetal type origin of the PCAs supplied via widely patent posterior communicating arteries. Small hypoplastic left P1 segment noted. PCAs grossly patent to their distal aspects, although not well evaluated due to motion artifact. No aneurysm or other vascular malformation. MRA NECK FINDINGS Visualized aortic arch of normal caliber with normal branch pattern. No high-grade stenosis about the origin of the great vessels. Visualized subclavian arteries widely patent. Right common and internal carotid arteries are widely patent without high-grade or flow-limiting stenosis. Mild atheromatous irregularity about the right carotid bifurcation without significant stenosis. Left common internal carotid arteries are widely patent without high-grade or flow-limiting stenosis. Mild atheromatous irregularity about the left carotid bifurcation without significant stenosis. Dominant right vertebral artery rises from the right subclavian artery. Origin of the right vertebral artery not well evaluated on this exam, with possible superimposed stenosis not excluded (series 100, image 6). Right V1 and V2 segments are widely patent distally. Right V3 segment not well evaluated, but appears grossly patent on source imaging. Left vertebral artery occluded just distal to its origin, and remains occluded within the neck. Visible flow within the intracranial left V4 segment as detailed on the MRA head portion of this exam. IMPRESSION: MRI HEAD IMPRESSION: 1. No acute intracranial process identified. 2. Generalized age-related cerebral atrophy with mild to moderate chronic small vessel ischemic disease. MRA HEAD IMPRESSION: 1. Fetal type origin of the PCAs with secondary diminutive vertebrobasilar  system. 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 Left vertebral artery is occluded within the neck (see below). Intracranial left V4 segment is opacified, which may be via collateralization and/or retrograde flow across the vertebrobasilar junction. Dominant right vertebral artery patent without significant stenosis. A component of vertebrobasilar insufficiency may be contributing to patient's symptoms. 2. Widely patent anterior circulation without stenosis or large vessel occlusion. 3. Distal small vessel atheromatous irregularity. MRA NECK IMPRESSION: 1. Occlusion of the left vertebral artery at its origin, which remains occluded throughout the neck. Dominant right vertebral artery patent without obvious stenosis, although the origin of the right vertebral artery not well evaluated on this exam. 2. Widely patent carotid artery systems bilaterally without flow-limiting stenosis. Electronically Signed   By: Jeannine Boga M.D.   On: 10/22/2016 14:44   Mr Lumbar Spine Wo Contrast  Result Date: 10/23/2016 CLINICAL DATA:  Bilateral lower extremity weakness. The patient reports recent falls. EXAM: MRI LUMBAR SPINE WITHOUT CONTRAST TECHNIQUE: Multiplanar, multisequence MR imaging of the lumbar spine  was performed. No intravenous contrast was administered. COMPARISON:  MRI lumbar spine 04/29/2016. FINDINGS: Segmentation:  Standard. Alignment: Trace anterolisthesis L4 on L5 is unchanged. Minimal retrolisthesis L1 on L2 is also seen. Otherwise maintained. Vertebrae:  No fracture or worrisome lesion. Conus medullaris: Extends to the T12. Level and appears normal. Paraspinal and other soft tissues: Negative. Disc levels: T12-L1 is imaged in the sagittal plane only and demonstrates only mild facet degenerative change. L1-2: Minimal disc bulge and mild facet degenerative disease without central canal or foraminal narrowing, unchanged. L2-3:  Mild  facet degenerative change.  Otherwise negative. L3-4: Minimal disc bulge and mild facet degenerative change. Mildly prominent epidural fat. The central canal is open. Mild foraminal narrowing is more notable on the right. The appearance is unchanged. L4-5: Moderate facet degenerative disease. There is a broad-based disc bulge with a superimposed small down turning left paracentral protrusion best seen on image 10 of series 2. Moderate central canal stenosis and narrowing in the lateral recesses is identified. Moderate foraminal narrowing is worse on the right. The appearance is unchanged. L5-S1: Somewhat prominent epidural fat is seen. Mild to moderate facet degenerative disease identified. Small central protrusion is unchanged. The central spinal canal and neural foramina are open. IMPRESSION: Spondylosis most notable at L4-5 where there is a broad-based disc bulge and superimposed small left paracentral protrusion which is more conspicuous than on the prior study. Moderate central canal stenosis and narrowing of both subarticular recesses which could impact either descending L5 root appears unchanged. Moderate bilateral foraminal narrowing appears somewhat worse on the right is also unchanged. The appearance of the lumbar spine is otherwise unchanged with mild spondylosis as described above. Electronically Signed   By: Inge Rise M.D.   On: 10/23/2016 09:00   US Venous Img Lower Unilateral Left  Result Date: 10/22/2016 CLINICAL DATA:  Left thigh pain for 3 months. EXAM: Left LOWER EXTREMITY VENOUS DOPPLER ULTRASOUND TECHNIQUE: Gray-scale sonography with graded compression, as well as color Doppler and duplex ultrasound were performed to evaluate the lower extremity deep venous systems from the level of the common femoral vein and including the common femoral, femoral, profunda femoral, popliteal and calf veins including the posterior tibial, peroneal and gastrocnemius veins when visible. The superficial  great saphenous vein was also interrogated. Spectral Doppler was utilized to evaluate flow at rest and with distal augmentation maneuvers in the common femoral, femoral and popliteal veins. COMPARISON:  None. FINDINGS: Contralateral Common Femoral Vein: Respiratory phasicity is normal and symmetric with the symptomatic side. No evidence of thrombus. Normal compressibility. Common Femoral Vein: No evidence of thrombus. Normal compressibility, respiratory phasicity and response to augmentation. Saphenofemoral Junction: No evidence of thrombus. Normal compressibility and flow on color Doppler imaging. Profunda Femoral Vein: No evidence of thrombus. Normal compressibility and flow on color Doppler imaging. Femoral Vein: No evidence of thrombus. Normal compressibility, respiratory phasicity and response to augmentation. Popliteal Vein: No evidence of thrombus. Normal compressibility, respiratory phasicity and response to augmentation. Calf Veins: No evidence of thrombus. Normal compressibility and flow on color Doppler imaging. Venous Reflux:  None. Other Findings:  None. IMPRESSION: No evidence of DVT within the left lower extremity. Electronically Signed   By: Marijo Conception, M.D.   On: 10/22/2016 14:25   Dg Foot Complete Left  Result Date: 10/21/2016 CLINICAL DATA:  Patient lost balance and fell today. Recent foot surgery. EXAM: LEFT FOOT - COMPLETE 3+ VIEW COMPARISON:  None. FINDINGS: Three-view exam of the left foot shows amputation of the  third, fourth, and fifth toes. No fracture. No subluxation or dislocation. Vascular calcification suggest diabetes. IMPRESSION: Status post 3 toe amputation.  No acute findings. Electronically Signed   By: Misty Stanley M.D.   On: 10/21/2016 20:43   Mr [name] Head/brain KN Cm  Result Date: 10/22/2016 CLINICAL DATA:  Initial evaluation for ataxia, multiple falls. EXAM: MRI HEAD WITHOUT CONTRAST MRA HEAD WITHOUT CONTRAST MRA NECK WITHOUT AND WITH CONTRAST TECHNIQUE:  Multiplanar, multiecho pulse sequences of the brain and surrounding structures were obtained without intravenous contrast. Angiographic images of the Circle of Willis were obtained using MRA technique without intravenous contrast. Angiographic images of the neck were obtained using MRA technique without and with intravenous contrast. Carotid stenosis measurements (when applicable) are obtained utilizing NASCET criteria, using the distal internal carotid diameter as the denominator. CONTRAST:  53mL MULTIHANCE GADOBENATE DIMEGLUMINE 529 MG/ML IV SOLN COMPARISON:  Prior CT from 10/21/2016. FINDINGS: MRI HEAD FINDINGS Diffuse prominence of the CSF containing spaces is compatible with generalized age related cerebral atrophy. Patchy and confluent T2/FLAIR hyperintensity within the periventricular and deep white matter both cerebral hemispheres most consistent with chronic small vessel ischemic disease, mild to moderate nature. Chronic microvascular changes present within the pons. The no abnormal foci of restricted diffusion to suggest acute or subacute ischemia. Gray-white matter differentiation maintained. No encephalomalacia to suggest chronic infarction. No evidence for acute or chronic intracranial hemorrhage. No mass lesion, midline shift or mass effect. Ventricles normal size without evidence for hydrocephalus. No extra-axial fluid collection. Major dural sinuses are grossly patent. Pituitary gland within normal limits. Midline structures intact and normal. Major intracranial vascular flow voids are maintained. Vertebrobasilar system is diminutive. Mild degenerative thickening noted at the tectorial membrane without significant stenosis. Craniocervical junction otherwise unremarkable. Visualized upper cervical spine within normal limits. Bone marrow signal intensity normal. No scalp soft tissue abnormality. Globes normal soft tissues within normal limits. Patient status post lens extraction bilaterally. Paranasal  sinuses and mastoid air cells are clear. Inner ear structures normal. MRA HEAD FINDINGS ANTERIOR CIRCULATION: Study mildly degraded by motion artifact. The Distal cervical segments of the internal carotid arteries are widely patent with antegrade flow. Petrous, cavernous, and supraclinoid segments patent without flow-limiting stenosis. ICA termini widely patent. A1 segments patent bilaterally. Anterior cerebral arteries patent to their distal aspects without stenosis. M1 segments patent without stenosis or occlusion. MCA bifurcations normal. No proximal M2 occlusion. Distal MCA branches demonstrate mild small vessel atheromatous irregularity but are well opacified and fairly symmetric. POSTERIOR CIRCULATION: Vertebral arteries are diminutive, with dominant right vertebral artery. Minimal atheromatous irregularity within the right vertebral artery without flow-limiting stenosis. Left V4 segment is hypoplastic and somewhat attenuated with atheromatous moderate irregularity, but is opacified to the vertebrobasilar junction. Posterior inferior cerebral arteries are patent proximally. Basilar artery diminutive with multifocal atheromatous irregularity with moderate to severe multifocal stenoses at its mid and distal aspect (series 109, image 9). Superior cerebral arteries are patent bilaterally. Fetal type origin of the PCAs supplied via widely patent posterior communicating arteries. Small hypoplastic left P1 segment noted. PCAs grossly patent to their distal aspects, although not well evaluated due to motion artifact. No aneurysm or other vascular malformation. MRA NECK FINDINGS Visualized aortic arch of normal caliber with normal branch pattern. No high-grade stenosis about the origin of the great vessels. Visualized subclavian arteries widely patent. Right common and internal carotid arteries are widely patent without high-grade or flow-limiting stenosis. Mild atheromatous irregularity about the right carotid  bifurcation without significant stenosis. Left common  internal carotid arteries are widely patent without high-grade or flow-limiting stenosis. Mild atheromatous irregularity about the left carotid bifurcation without significant stenosis. Dominant right vertebral artery rises from the right subclavian artery. Origin of the right vertebral artery not well evaluated on this exam, with possible superimposed stenosis not excluded (series 100, image 6). Right V1 and V2 segments are widely patent distally. Right V3 segment not well evaluated, but appears grossly patent on source imaging. Left vertebral artery occluded just distal to its origin, and remains occluded within the neck. Visible flow within the intracranial left V4 segment as detailed on the MRA head portion of this exam. IMPRESSION: MRI HEAD IMPRESSION: 1. No acute intracranial process identified. 2. Generalized age-related cerebral atrophy with mild to moderate chronic small vessel ischemic disease. MRA HEAD IMPRESSION: 1. Fetal type origin of the PCAs with secondary diminutive vertebrobasilar system. 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 Left vertebral artery is occluded within the neck (see below). Intracranial left V4 segment is opacified, which may be via collateralization and/or retrograde flow across the vertebrobasilar junction. Dominant right vertebral artery patent without significant stenosis. A component of vertebrobasilar insufficiency may be contributing to patient's symptoms. 2. Widely patent anterior circulation without stenosis or large vessel occlusion. 3. Distal small vessel atheromatous irregularity. MRA NECK IMPRESSION: 1. Occlusion of the left vertebral artery at its origin, which remains occluded throughout the neck. Dominant right vertebral artery patent without obvious stenosis, although the origin of the right vertebral artery not well evaluated on this exam.  2. Widely patent carotid artery systems bilaterally without flow-limiting stenosis. Electronically Signed   By: Jeannine Boga M.D.   On: 10/22/2016 14:44   US Abdomen Limited Ruq  Result Date: 10/23/2016 CLINICAL DATA:  82 year old male with elevated LFTs. History of cholecystectomy. EXAM: US ABDOMEN LIMITED - RIGHT UPPER QUADRANT COMPARISON:  09/17/2006 CT FINDINGS: Gallbladder: Not visualized compatible with cholecystectomy. Common bile duct: Diameter: 7.9 mm. Mild fullness of the intrahepatic biliary system noted. There is no evidence of CBD dilatation. Liver: No focal lesion identified. Within normal limits in parenchymal echogenicity. IMPRESSION: Mild fullness of the intrahepatic biliary system without CBD dilatation. No other hepatic abnormalities. Electronically Signed   By: Margarette Canada M.D.   On: 10/23/2016 08:16     ASSESSMENT AND PLAN:   82 year old male with past medical history of diabetes, hypertension, peripheral vascular disease, chronic atrial fibrillation, prostate cancer, history of CHF, who presented to the hospital due to fall and ataxic gait.  1. Falls/ataxic gait-patient has undergone extensive workup including CT scan of the head, MRI/MRA of the brain and MRI of the lumbar spine. MRI was negative for acute stroke. MRA did show some vertebral stenosis. MRI of the lumbar spine showing some mild L4-L5 canal stenosis with disc bulging. Patient denies any back pain. -Seen by neurology and continue antiplatelet therapy. Physical therapy has arrived the patient and recommended short-term rehabilitation and possible discharge her tomorrow.  2. Left lower extremity weakness with pain-etiology unclear presently. Seen by vascular surgery and patient has history of peripheral artery disease with claudication. As per them perfusion is intact. Continue current care and outpatient follow-up.  3. Chronic atrial fibrillation-rate controlled. Continue Cardizem, metoprolol -Continue  warfarin.  4. Diabetes-blood sugar stable. Continue sliding scale insulin.  5. Diabetic neuropathy-continue gabapentin.  6. Hyperlipidemia - cont. Atorvastatin.   All the records are reviewed and case discussed with Care Management/Social Worker. Management plans discussed with the patient, family and they  are in agreement.  CODE STATUS: Full code  DVT Prophylaxis: Warfarin  TOTAL TIME TAKING CARE OF THIS PATIENT: 30 minutes.   POSSIBLE D/C IN 1-2 DAYS, DEPENDING ON CLINICAL CONDITION.   Henreitta Leber M.D on 10/23/2016 at 1:24 PM  Between 7am to 6pm - Pager - [phone]  After 6pm go to [email or web address] - Proofreader  [facility]  Office  [phone]  CC: Primary care physician; System, Provider Not In

## 2016-10-24 LAB — GLUCOSE, CAPILLARY
GLUCOSE-CAPILLARY: 217 mg/dL — AB (ref 65–99)
GLUCOSE-CAPILLARY: 223 mg/dL — AB (ref 65–99)
Glucose-Capillary: 204 mg/dL — ABNORMAL HIGH (ref 65–99)
Glucose-Capillary: 136 mg/dL — ABNORMAL HIGH (ref 65–99)

## 2016-10-24 LAB — PROTIME-INR
INR: 2.78
PROTHROMBIN TIME: 29.9 s — AB (ref 11.4–15.2)

## 2016-10-24 LAB — VANCOMYCIN, TROUGH: VANCOMYCIN TR: 11 ug/mL — AB (ref 15–20)

## 2016-10-24 MED ORDER — TRAMADOL HCL 50 MG PO TABS: 50.0000 mg | ORAL_TABLET | Freq: Four times a day (QID) | ORAL | 0 refills | Status: DC | PRN

## 2016-10-24 MED ORDER — OXYCODONE-ACETAMINOPHEN 7.5-325 MG PO TABS: 1.0000 | ORAL_TABLET | ORAL | 0 refills | Status: DC | PRN

## 2016-10-24 MED ORDER — LACTULOSE 10 GM/15ML PO SOLN
30.0000 g | Freq: Once | ORAL | Status: AC
  Administered 2016-10-24: 30 g via ORAL
  Filled 2016-10-24: qty 60

## 2016-10-24 MED ORDER — FLEET ENEMA 7-19 GM/118ML RE ENEM
1.0000 | ENEMA | Freq: Once | RECTAL | Status: AC
Start: 2016-10-24 — End: 2016-10-24
  Administered 2016-10-24: 16:00:00 1 via RECTAL

## 2016-10-24 MED ORDER — WARFARIN SODIUM 5 MG PO TABS: 5.0000 mg | ORAL_TABLET | Freq: Every day | ORAL | Status: DC

## 2016-10-24 NOTE — Progress Notes (Signed)
Pt being d/c'd to SNF  Dressing: Change PRN breakthrough drainage.  Apply dry bulky dressing.  Do not remove mepitel on forefoot.  WB status: NWB to left foot. WB to heel if needed for transfer.

## 2016-10-24 NOTE — Clinical Social Work Note (Signed)
CSW delivered packet to the patient's chart for discharge. The RN is aware; she reported that the patient spoke with his wife this morning and transport will most likely be non-emergent EMS. CSW will continue to follow pending additional discharge needs.  Santiago Bumpers, MSW, [facility] Presser [phone]

## 2016-10-24 NOTE — Plan of Care (Signed)
Problem: Safety: Goal: Ability to remain free from injury will improve Outcome: Progressing High fall risk patient; currently on low bed with bed exit alarm activated. Pt demonstrated use of call light.   Problem: Activity: Goal: Will remain free from falls Outcome: Progressing High fall risk patient; currently on low bed with bed exit alarm activated. Pt demonstrated use of call light.

## 2016-10-24 NOTE — Clinical Social Work Note (Signed)
CSW received update from MD that discharge summary has an addendum. The CSW sent the updated discharge summary to the facility.  [name], MSW, [facility] [phone]

## 2016-10-24 NOTE — Progress Notes (Signed)
ANTICOAGULATION CONSULT NOTE - Follow Up Consult  Pharmacy Consult for warfarin dosing Indication: atrial fibrillation  Allergies  Allergen Reactions  . Contrast Media [Iodinated Diagnostic Agents] Shortness Of Breath    SOB  . Iohexol      Desc: Respiratory Distress, Laryngedema, diaphoresis, Onset Date: [id number]   . Latex Rash    Patient Measurements: Height: 5\' 7"  (170.2 cm) Weight: 190 lb (86.2 kg) IBW/kg (Calculated) : 66.1 Heparin Dosing Weight: n/a  Vital Signs: Temp: 98.1 F (36.7 C) (06/03 0414) Temp Source: Oral (06/03 0414) BP: 139/75 (06/03 0414) Pulse Rate: 55 (06/03 0414)  Labs:  Recent Labs  10/21/16 2028 10/22/16 0522 10/22/16 0945 10/23/16 0456 10/24/16 0536  HGB 13.9  --   --   --   --   HCT 42.3  --   --   --   --   PLT 298  --   --   --   --   LABPROT  --  51.4*  --  32.3* 29.9*  INR  --  5.47*  --  3.06 2.78  CREATININE 1.18  --   --   --   --   CKTOTAL  --   --  131  --   --   TROPONINI <0.03  --   --   --   --     Estimated Creatinine Clearance: 53.2 mL/min (by C-G formula based on SCr of 1.18 mg/dL).   Medical History: Past Medical History:  Diagnosis Date  . Arthritis   . Atrial fibrillation ([place])   . Carcinoma of prostate ([place])   . CHF (congestive heart failure) ([place])   . Chronic kidney disease   . Diabetes mellitus without complication ([place])   . ED (erectile dysfunction)   . Frequent urination   . Hyperlipidemia   . Hypertension   . Moderate mitral insufficiency   . Peripheral vascular disease ([place])   . Pneumonia 04/2016  . Prostate cancer ([place])   . Sleep apnea    OSA--USE C-PAP  . Ulcer of left foot due to type 2 diabetes mellitus ([place])   . Urinary stress incontinence, male     Medications:  Home warfarin dose 5 mg daily per med rec.   Assessment: INR slightly above goal range; however down from 5.47 to 3.06.No bleeding issues per RN.  Goal of Therapy:  INR 2-3    Plan:  INR now in therapeutic range and  trending down. Resume home dose of warfarin. Will recheck INR with AM labs.    Kahleb Mcclane S 10/24/2016,6:35 AM

## 2016-10-24 NOTE — Clinical Social Work Note (Addendum)
CSW has attempted to contact the patient's family to discuss transportation at discharge. The patient's spouse did not answer and has no voicemail set up. CSW will continue to attempt.  CSW has attempted again to contact the patient's spouse with no answer at either phone number. CSW will continue to attempt.  Santiago Bumpers, MSW, [facility] Presser [phone]

## 2016-10-24 NOTE — Discharge Summary (Addendum)
[facility] [place] NAME: [name]    MR#:  [id number]  DATE OF BIRTH:  [birth year]  DATE OF ADMISSION:  10/21/2016 ADMITTING PHYSICIAN: Lance Coon, MD  DATE OF DISCHARGE: 10/24/2016  PRIMARY CARE PHYSICIAN: System, Provider Not In    ADMISSION DIAGNOSIS:  Post-operative pain [G89.18] Fall, initial encounter [W19.XXXA] Cellulitis of other specified site [W10.932]  DISCHARGE DIAGNOSIS:  Principal Problem:   Ataxia Active Problems:   Type 2 diabetes mellitus treated with insulin (HCC)   Essential hypertension   PAD (peripheral artery disease) (HCC)   A-fib (HCC)   SECONDARY DIAGNOSIS:   Past Medical History:  Diagnosis Date  . Arthritis   . Atrial fibrillation ([place])   . Carcinoma of prostate ([place])   . CHF (congestive heart failure) ([place])   . Chronic kidney disease   . Diabetes mellitus without complication ([place])   . ED (erectile dysfunction)   . Frequent urination   . Hyperlipidemia   . Hypertension   . Moderate mitral insufficiency   . Peripheral vascular disease ([place])   . Pneumonia 04/2016  . Prostate cancer ([place])   . Sleep apnea    OSA--USE C-PAP  . Ulcer of left foot due to type 2 diabetes mellitus ([place])   . Urinary stress incontinence, male     HOSPITAL COURSE:   82 year old male with past medical history of diabetes, hypertension, peripheral vascular disease, chronic atrial fibrillation, prostate cancer, history of CHF, who presented to the hospital due to fall and ataxic gait.  1. Falls/ataxic gait-patient has undergone extensive workup including CT scan of the head, MRI/MRA of the brain and MRI of the lumbar spine. MRI Brain was negative for acute stroke. MRA did show some vertebral stenosis but pt. Will cont. Anti-platelet therapy and statin with no further intervention. MRI of the lumbar spine showing some mild L4-L5 canal stenosis with disc bulging. Patient denies any back pain or sciatica of any  incontinence.   -Seen by neurology and continue antiplatelet therapy. Physical therapy evaluated the patient and recommended short-term rehabilitation and pt. Will be discharged there presently.    2. Left lower extremity weakness with pain-etiology unclear presently. Seen by vascular surgery and patient has history of peripheral artery disease with claudication. As per them perfusion is intact. Continue current care and outpatient follow-up.  3. Chronic atrial fibrillation-rate controlled. Continue Cardizem, metoprolol -Continue warfarin and INR therapeutic upon discharge.    4. Diabetes- blood sugars have been stable and pt. Will his lantus, Metformin upon discharge.    5. Diabetic neuropathy- he will continue gabapentin.  6. Hyperlipidemia - he will cont. Atorvastatin.   7. S/p Left toe amputations - cont. Follow up with Podiatry as outpatient.   Dressing: Change PRN breakthrough drainage.  Apply dry bulky dressing.  Do not remove mepitel on forefoot.  WB status: NWB to left foot. WB to heel if needed for transfer.  DISCHARGE CONDITIONS:   Stable.   CONSULTS OBTAINED:  Treatment Team:  Samara Deist, DPM Alexis Goodell, MD  DRUG ALLERGIES:   Allergies  Allergen Reactions  . Contrast Media [Iodinated Diagnostic Agents] Shortness Of Breath    SOB  . Iohexol      Desc: Respiratory Distress, Laryngedema, diaphoresis, Onset Date: [id number]   . Latex Rash    DISCHARGE MEDICATIONS:   Allergies as of 10/24/2016      Reactions   Contrast Media [iodinated Diagnostic Agents] Shortness Of Breath   SOB   Iohexol  Desc: Respiratory Distress, Laryngedema, diaphoresis, Onset Date: [id number]   Latex Rash      Medication List    STOP taking these medications   oxyCODONE-acetaminophen 7.5-325 MG tablet Commonly known as:  PERCOCET     TAKE these medications   aspirin EC 81 MG tablet Take 1 tablet (81 mg total) by mouth daily.   atorvastatin 80 MG  tablet Commonly known as:  LIPITOR Take 80 mg by mouth daily at 6 PM.   diltiazem 300 MG 24 hr capsule Commonly known as:  CARDIZEM CD Take 300 mg by mouth daily.   furosemide 20 MG tablet Commonly known as:  LASIX TAKE 1 TABLET DAILY   gabapentin 100 MG capsule Commonly known as:  NEURONTIN Take 200 mg by mouth 3 (three) times daily.   insulin glargine 100 UNIT/ML injection Commonly known as:  LANTUS Inject 0.24 mLs (24 Units total) into the skin at bedtime.   metFORMIN 1000 MG tablet Commonly known as:  GLUCOPHAGE TAKE 1 TABLET TWICE A DAY WITH MEALS   metoprolol tartrate 100 MG tablet Commonly known as:  LOPRESSOR Take 1 tablet (100 mg total) by mouth 2 (two) times daily.   polyethylene glycol packet Commonly known as:  MIRALAX / GLYCOLAX Take 17 g by mouth daily as needed for mild constipation.   traMADol 50 MG tablet Commonly known as:  ULTRAM Take 1 tablet (50 mg total) by mouth every 6 (six) hours as needed for moderate pain.   warfarin 5 MG tablet Commonly known as:  COUMADIN Take 5 mg by mouth at bedtime.         DISCHARGE INSTRUCTIONS:   DIET:  Cardiac diet and Diabetic diet  DISCHARGE CONDITION:  Stable  ACTIVITY:  Activity as tolerated  OXYGEN:  Home Oxygen: No.   Oxygen Delivery: room air  DISCHARGE LOCATION:  nursing home   If you experience worsening of your admission symptoms, develop shortness of breath, life threatening emergency, suicidal or homicidal thoughts you must seek medical attention immediately by calling 911 or calling your MD immediately  if symptoms less severe.  You Must read complete instructions/literature along with all the possible adverse reactions/side effects for all the Medicines you take and that have been prescribed to you. Take any new Medicines after you have completely understood and accpet all the possible adverse reactions/side effects.   Please note  You were cared for by a hospitalist during your  hospital stay. If you have any questions about your discharge medications or the care you received while you were in the hospital after you are discharged, you can call the unit and asked to speak with the hospitalist on call if the hospitalist that took care of you is not available. Once you are discharged, your primary care physician will handle any further medical issues. Please note that NO REFILLS for any discharge medications will be authorized once you are discharged, as it is imperative that you return to your primary care physician (or establish a relationship with a primary care physician if you do not have one) for your aftercare needs so that they can reassess your need for medications and monitor your lab values.     Today   Still having some difficulty walking.  No fever.  No chest pain, N/V or abdominal pain. Will d/c to SNF for ongoing rehab.   VITAL SIGNS:  Blood pressure (!) 156/87, pulse (!) 54, temperature 98.1 F (36.7 C), temperature source Oral, resp. rate 20, height 5\' 7"  (  1.702 m), weight 86.2 kg (190 lb), SpO2 94 %.  I/O:    Intake/Output Summary (Last 24 hours) at 10/24/16 0942 Last data filed at 10/23/16 1300  Gross per 24 hour  Intake                0 ml  Output              300 ml  Net             -300 ml    PHYSICAL EXAMINATION:   GENERAL:  82 y.o.-year-old patient lying in bed in no acute distress.  EYES: Pupils equal, round, reactive to light and accommodation. No scleral icterus. Extraocular muscles intact.  HEENT: Head atraumatic, normocephalic. Oropharynx and nasopharynx clear.  NECK:  Supple, no jugular venous distention. No thyroid enlargement, no tenderness.  LUNGS: Normal breath sounds bilaterally, no wheezing, rales, rhonchi. No use of accessory muscles of respiration.  CARDIOVASCULAR: S1, S2 normal. No murmurs, rubs, or gallops.  ABDOMEN: Soft, nontender, nondistended. Bowel sounds present. No organomegaly or mass.  EXTREMITIES: No cyanosis,  clubbing or edema b/l.   Dressing on Left Foot from recent Toe amputations.   NEUROLOGIC: Cranial nerves II through XII are intact. No focal Motor or sensory deficits b/l.  Globally weak PSYCHIATRIC: The patient is alert and oriented x 3.  SKIN: No obvious rash, lesion, or ulcer.   DATA REVIEW:   CBC  Recent Labs Lab 10/21/16 2028  WBC 15.2*  HGB 13.9  HCT 42.3  PLT 298    Chemistries   Recent Labs Lab 10/21/16 2028 10/23/16 0456  NA 135  --   K 3.8  --   CL 99*  --   CO2 23  --   GLUCOSE 162*  --   BUN 29*  --   CREATININE 1.18  --   CALCIUM 8.7*  --   AST 64* 32  ALT 93* 54  ALKPHOS 371* 269*  BILITOT 0.8 1.2    Cardiac Enzymes  Recent Labs Lab 10/21/16 2028  TROPONINI <0.03     RADIOLOGY:  Mr [name] Neck W Wo Contrast  Result Date: 10/22/2016 CLINICAL DATA:  Initial evaluation for ataxia, multiple falls. EXAM: MRI HEAD WITHOUT CONTRAST MRA HEAD WITHOUT CONTRAST MRA NECK WITHOUT AND WITH CONTRAST TECHNIQUE: Multiplanar, multiecho pulse sequences of the brain and surrounding structures were obtained without intravenous contrast. Angiographic images of the Circle of Willis were obtained using MRA technique without intravenous contrast. Angiographic images of the neck were obtained using MRA technique without and with intravenous contrast. Carotid stenosis measurements (when applicable) are obtained utilizing NASCET criteria, using the distal internal carotid diameter as the denominator. CONTRAST:  35mL MULTIHANCE GADOBENATE DIMEGLUMINE 529 MG/ML IV SOLN COMPARISON:  Prior CT from 10/21/2016. FINDINGS: MRI HEAD FINDINGS Diffuse prominence of the CSF containing spaces is compatible with generalized age related cerebral atrophy. Patchy and confluent T2/FLAIR hyperintensity within the periventricular and deep white matter both cerebral hemispheres most consistent with chronic small vessel ischemic disease, mild to moderate nature. Chronic microvascular changes present within  the pons. The no abnormal foci of restricted diffusion to suggest acute or subacute ischemia. Gray-white matter differentiation maintained. No encephalomalacia to suggest chronic infarction. No evidence for acute or chronic intracranial hemorrhage. No mass lesion, midline shift or mass effect. Ventricles normal size without evidence for hydrocephalus. No extra-axial fluid collection. Major dural sinuses are grossly patent. Pituitary gland within normal limits. Midline structures intact and normal. Major intracranial vascular flow voids  are maintained. Vertebrobasilar system is diminutive. Mild degenerative thickening noted at the tectorial membrane without significant stenosis. Craniocervical junction otherwise unremarkable. Visualized upper cervical spine within normal limits. Bone marrow signal intensity normal. No scalp soft tissue abnormality. Globes normal soft tissues within normal limits. Patient status post lens extraction bilaterally. Paranasal sinuses and mastoid air cells are clear. Inner ear structures normal. MRA HEAD FINDINGS ANTERIOR CIRCULATION: Study mildly degraded by motion artifact. The Distal cervical segments of the internal carotid arteries are widely patent with antegrade flow. Petrous, cavernous, and supraclinoid segments patent without flow-limiting stenosis. ICA termini widely patent. A1 segments patent bilaterally. Anterior cerebral arteries patent to their distal aspects without stenosis. M1 segments patent without stenosis or occlusion. MCA bifurcations normal. No proximal M2 occlusion. Distal MCA branches demonstrate mild small vessel atheromatous irregularity but are well opacified and fairly symmetric. POSTERIOR CIRCULATION: Vertebral arteries are diminutive, with dominant right vertebral artery. Minimal atheromatous irregularity within the right vertebral artery without flow-limiting stenosis. Left V4 segment is hypoplastic and somewhat attenuated with atheromatous moderate  irregularity, but is opacified to the vertebrobasilar junction. Posterior inferior cerebral arteries are patent proximally. Basilar artery diminutive with multifocal atheromatous irregularity with moderate to severe multifocal stenoses at its mid and distal aspect (series 109, image 9). Superior cerebral arteries are patent bilaterally. Fetal type origin of the PCAs supplied via widely patent posterior communicating arteries. Small hypoplastic left P1 segment noted. PCAs grossly patent to their distal aspects, although not well evaluated due to motion artifact. No aneurysm or other vascular malformation. MRA NECK FINDINGS Visualized aortic arch of normal caliber with normal branch pattern. No high-grade stenosis about the origin of the great vessels. Visualized subclavian arteries widely patent. Right common and internal carotid arteries are widely patent without high-grade or flow-limiting stenosis. Mild atheromatous irregularity about the right carotid bifurcation without significant stenosis. Left common internal carotid arteries are widely patent without high-grade or flow-limiting stenosis. Mild atheromatous irregularity about the left carotid bifurcation without significant stenosis. Dominant right vertebral artery rises from the right subclavian artery. Origin of the right vertebral artery not well evaluated on this exam, with possible superimposed stenosis not excluded (series 100, image 6). Right V1 and V2 segments are widely patent distally. Right V3 segment not well evaluated, but appears grossly patent on source imaging. Left vertebral artery occluded just distal to its origin, and remains occluded within the neck. Visible flow within the intracranial left V4 segment as detailed on the MRA head portion of this exam. IMPRESSION: MRI HEAD IMPRESSION: 1. No acute intracranial process identified. 2. Generalized age-related cerebral atrophy with mild to moderate chronic small vessel ischemic disease. MRA HEAD  IMPRESSION: 1. Fetal type origin of the PCAs with secondary diminutive vertebrobasilar system. 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 Left vertebral artery is occluded within the neck (see below). Intracranial left V4 segment is opacified, which may be via collateralization and/or retrograde flow across the vertebrobasilar junction. Dominant right vertebral artery patent without significant stenosis. A component of vertebrobasilar insufficiency may be contributing to patient's symptoms. 2. Widely patent anterior circulation without stenosis or large vessel occlusion. 3. Distal small vessel atheromatous irregularity. MRA NECK IMPRESSION: 1. Occlusion of the left vertebral artery at its origin, which remains occluded throughout the neck. Dominant right vertebral artery patent without obvious stenosis, although the origin of the right vertebral artery not well evaluated on this exam. 2. Widely patent carotid artery systems bilaterally without flow-limiting stenosis. Electronically Signed  By: Jeannine Boga M.D.   On: 10/22/2016 14:44   Mr Brain Wo Contrast  Result Date: 10/22/2016 CLINICAL DATA:  Initial evaluation for ataxia, multiple falls. EXAM: MRI HEAD WITHOUT CONTRAST MRA HEAD WITHOUT CONTRAST MRA NECK WITHOUT AND WITH CONTRAST TECHNIQUE: Multiplanar, multiecho pulse sequences of the brain and surrounding structures were obtained without intravenous contrast. Angiographic images of the Circle of Willis were obtained using MRA technique without intravenous contrast. Angiographic images of the neck were obtained using MRA technique without and with intravenous contrast. Carotid stenosis measurements (when applicable) are obtained utilizing NASCET criteria, using the distal internal carotid diameter as the denominator. CONTRAST:  87mL MULTIHANCE GADOBENATE DIMEGLUMINE 529 MG/ML IV SOLN COMPARISON:  Prior CT from 10/21/2016. FINDINGS:  MRI HEAD FINDINGS Diffuse prominence of the CSF containing spaces is compatible with generalized age related cerebral atrophy. Patchy and confluent T2/FLAIR hyperintensity within the periventricular and deep white matter both cerebral hemispheres most consistent with chronic small vessel ischemic disease, mild to moderate nature. Chronic microvascular changes present within the pons. The no abnormal foci of restricted diffusion to suggest acute or subacute ischemia. Gray-white matter differentiation maintained. No encephalomalacia to suggest chronic infarction. No evidence for acute or chronic intracranial hemorrhage. No mass lesion, midline shift or mass effect. Ventricles normal size without evidence for hydrocephalus. No extra-axial fluid collection. Major dural sinuses are grossly patent. Pituitary gland within normal limits. Midline structures intact and normal. Major intracranial vascular flow voids are maintained. Vertebrobasilar system is diminutive. Mild degenerative thickening noted at the tectorial membrane without significant stenosis. Craniocervical junction otherwise unremarkable. Visualized upper cervical spine within normal limits. Bone marrow signal intensity normal. No scalp soft tissue abnormality. Globes normal soft tissues within normal limits. Patient status post lens extraction bilaterally. Paranasal sinuses and mastoid air cells are clear. Inner ear structures normal. MRA HEAD FINDINGS ANTERIOR CIRCULATION: Study mildly degraded by motion artifact. The Distal cervical segments of the internal carotid arteries are widely patent with antegrade flow. Petrous, cavernous, and supraclinoid segments patent without flow-limiting stenosis. ICA termini widely patent. A1 segments patent bilaterally. Anterior cerebral arteries patent to their distal aspects without stenosis. M1 segments patent without stenosis or occlusion. MCA bifurcations normal. No proximal M2 occlusion. Distal MCA branches  demonstrate mild small vessel atheromatous irregularity but are well opacified and fairly symmetric. POSTERIOR CIRCULATION: Vertebral arteries are diminutive, with dominant right vertebral artery. Minimal atheromatous irregularity within the right vertebral artery without flow-limiting stenosis. Left V4 segment is hypoplastic and somewhat attenuated with atheromatous moderate irregularity, but is opacified to the vertebrobasilar junction. Posterior inferior cerebral arteries are patent proximally. Basilar artery diminutive with multifocal atheromatous irregularity with moderate to severe multifocal stenoses at its mid and distal aspect (series 109, image 9). Superior cerebral arteries are patent bilaterally. Fetal type origin of the PCAs supplied via widely patent posterior communicating arteries. Small hypoplastic left P1 segment noted. PCAs grossly patent to their distal aspects, although not well evaluated due to motion artifact. No aneurysm or other vascular malformation. MRA NECK FINDINGS Visualized aortic arch of normal caliber with normal branch pattern. No high-grade stenosis about the origin of the great vessels. Visualized subclavian arteries widely patent. Right common and internal carotid arteries are widely patent without high-grade or flow-limiting stenosis. Mild atheromatous irregularity about the right carotid bifurcation without significant stenosis. Left common internal carotid arteries are widely patent without high-grade or flow-limiting stenosis. Mild atheromatous irregularity about the left carotid bifurcation without significant stenosis. Dominant right vertebral artery rises from the right subclavian  artery. Origin of the right vertebral artery not well evaluated on this exam, with possible superimposed stenosis not excluded (series 100, image 6). Right V1 and V2 segments are widely patent distally. Right V3 segment not well evaluated, but appears grossly patent on source imaging. Left  vertebral artery occluded just distal to its origin, and remains occluded within the neck. Visible flow within the intracranial left V4 segment as detailed on the MRA head portion of this exam. IMPRESSION: MRI HEAD IMPRESSION: 1. No acute intracranial process identified. 2. Generalized age-related cerebral atrophy with mild to moderate chronic small vessel ischemic disease. MRA HEAD IMPRESSION: 1. Fetal type origin of the PCAs with secondary diminutive vertebrobasilar system. 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 Left vertebral artery is occluded within the neck (see below). Intracranial left V4 segment is opacified, which may be via collateralization and/or retrograde flow across the vertebrobasilar junction. Dominant right vertebral artery patent without significant stenosis. A component of vertebrobasilar insufficiency may be contributing to patient's symptoms. 2. Widely patent anterior circulation without stenosis or large vessel occlusion. 3. Distal small vessel atheromatous irregularity. MRA NECK IMPRESSION: 1. Occlusion of the left vertebral artery at its origin, which remains occluded throughout the neck. Dominant right vertebral artery patent without obvious stenosis, although the origin of the right vertebral artery not well evaluated on this exam. 2. Widely patent carotid artery systems bilaterally without flow-limiting stenosis. Electronically Signed   By: Jeannine Boga M.D.   On: 10/22/2016 14:44   Mr Lumbar Spine Wo Contrast  Result Date: 10/23/2016 CLINICAL DATA:  Bilateral lower extremity weakness. The patient reports recent falls. EXAM: MRI LUMBAR SPINE WITHOUT CONTRAST TECHNIQUE: Multiplanar, multisequence MR imaging of the lumbar spine was performed. No intravenous contrast was administered. COMPARISON:  MRI lumbar spine 04/29/2016. FINDINGS: Segmentation:  Standard. Alignment: Trace anterolisthesis L4 on L5 is  unchanged. Minimal retrolisthesis L1 on L2 is also seen. Otherwise maintained. Vertebrae:  No fracture or worrisome lesion. Conus medullaris: Extends to the T12. Level and appears normal. Paraspinal and other soft tissues: Negative. Disc levels: T12-L1 is imaged in the sagittal plane only and demonstrates only mild facet degenerative change. L1-2: Minimal disc bulge and mild facet degenerative disease without central canal or foraminal narrowing, unchanged. L2-3:  Mild facet degenerative change.  Otherwise negative. L3-4: Minimal disc bulge and mild facet degenerative change. Mildly prominent epidural fat. The central canal is open. Mild foraminal narrowing is more notable on the right. The appearance is unchanged. L4-5: Moderate facet degenerative disease. There is a broad-based disc bulge with a superimposed small down turning left paracentral protrusion best seen on image 10 of series 2. Moderate central canal stenosis and narrowing in the lateral recesses is identified. Moderate foraminal narrowing is worse on the right. The appearance is unchanged. L5-S1: Somewhat prominent epidural fat is seen. Mild to moderate facet degenerative disease identified. Small central protrusion is unchanged. The central spinal canal and neural foramina are open. IMPRESSION: Spondylosis most notable at L4-5 where there is a broad-based disc bulge and superimposed small left paracentral protrusion which is more conspicuous than on the prior study. Moderate central canal stenosis and narrowing of both subarticular recesses which could impact either descending L5 root appears unchanged. Moderate bilateral foraminal narrowing appears somewhat worse on the right is also unchanged. The appearance of the lumbar spine is otherwise unchanged with mild spondylosis as described above. Electronically Signed   By: Inge Rise M.D.   On: 10/23/2016 09:00  US Venous Img Lower Unilateral Left  Result Date: 10/22/2016 CLINICAL DATA:  Left  thigh pain for 3 months. EXAM: Left LOWER EXTREMITY VENOUS DOPPLER ULTRASOUND TECHNIQUE: Gray-scale sonography with graded compression, as well as color Doppler and duplex ultrasound were performed to evaluate the lower extremity deep venous systems from the level of the common femoral vein and including the common femoral, femoral, profunda femoral, popliteal and calf veins including the posterior tibial, peroneal and gastrocnemius veins when visible. The superficial great saphenous vein was also interrogated. Spectral Doppler was utilized to evaluate flow at rest and with distal augmentation maneuvers in the common femoral, femoral and popliteal veins. COMPARISON:  None. FINDINGS: Contralateral Common Femoral Vein: Respiratory phasicity is normal and symmetric with the symptomatic side. No evidence of thrombus. Normal compressibility. Common Femoral Vein: No evidence of thrombus. Normal compressibility, respiratory phasicity and response to augmentation. Saphenofemoral Junction: No evidence of thrombus. Normal compressibility and flow on color Doppler imaging. Profunda Femoral Vein: No evidence of thrombus. Normal compressibility and flow on color Doppler imaging. Femoral Vein: No evidence of thrombus. Normal compressibility, respiratory phasicity and response to augmentation. Popliteal Vein: No evidence of thrombus. Normal compressibility, respiratory phasicity and response to augmentation. Calf Veins: No evidence of thrombus. Normal compressibility and flow on color Doppler imaging. Venous Reflux:  None. Other Findings:  None. IMPRESSION: No evidence of DVT within the left lower extremity. Electronically Signed   By: Marijo Conception, M.D.   On: 10/22/2016 14:25   Mr [name] Head/brain BD Cm  Result Date: 10/22/2016 CLINICAL DATA:  Initial evaluation for ataxia, multiple falls. EXAM: MRI HEAD WITHOUT CONTRAST MRA HEAD WITHOUT CONTRAST MRA NECK WITHOUT AND WITH CONTRAST TECHNIQUE: Multiplanar, multiecho pulse  sequences of the brain and surrounding structures were obtained without intravenous contrast. Angiographic images of the Circle of Willis were obtained using MRA technique without intravenous contrast. Angiographic images of the neck were obtained using MRA technique without and with intravenous contrast. Carotid stenosis measurements (when applicable) are obtained utilizing NASCET criteria, using the distal internal carotid diameter as the denominator. CONTRAST:  58mL MULTIHANCE GADOBENATE DIMEGLUMINE 529 MG/ML IV SOLN COMPARISON:  Prior CT from 10/21/2016. FINDINGS: MRI HEAD FINDINGS Diffuse prominence of the CSF containing spaces is compatible with generalized age related cerebral atrophy. Patchy and confluent T2/FLAIR hyperintensity within the periventricular and deep white matter both cerebral hemispheres most consistent with chronic small vessel ischemic disease, mild to moderate nature. Chronic microvascular changes present within the pons. The no abnormal foci of restricted diffusion to suggest acute or subacute ischemia. Gray-white matter differentiation maintained. No encephalomalacia to suggest chronic infarction. No evidence for acute or chronic intracranial hemorrhage. No mass lesion, midline shift or mass effect. Ventricles normal size without evidence for hydrocephalus. No extra-axial fluid collection. Major dural sinuses are grossly patent. Pituitary gland within normal limits. Midline structures intact and normal. Major intracranial vascular flow voids are maintained. Vertebrobasilar system is diminutive. Mild degenerative thickening noted at the tectorial membrane without significant stenosis. Craniocervical junction otherwise unremarkable. Visualized upper cervical spine within normal limits. Bone marrow signal intensity normal. No scalp soft tissue abnormality. Globes normal soft tissues within normal limits. Patient status post lens extraction bilaterally. Paranasal sinuses and mastoid air cells  are clear. Inner ear structures normal. MRA HEAD FINDINGS ANTERIOR CIRCULATION: Study mildly degraded by motion artifact. The Distal cervical segments of the internal carotid arteries are widely patent with antegrade flow. Petrous, cavernous, and supraclinoid segments patent without flow-limiting stenosis. ICA termini widely patent. A1 segments  patent bilaterally. Anterior cerebral arteries patent to their distal aspects without stenosis. M1 segments patent without stenosis or occlusion. MCA bifurcations normal. No proximal M2 occlusion. Distal MCA branches demonstrate mild small vessel atheromatous irregularity but are well opacified and fairly symmetric. POSTERIOR CIRCULATION: Vertebral arteries are diminutive, with dominant right vertebral artery. Minimal atheromatous irregularity within the right vertebral artery without flow-limiting stenosis. Left V4 segment is hypoplastic and somewhat attenuated with atheromatous moderate irregularity, but is opacified to the vertebrobasilar junction. Posterior inferior cerebral arteries are patent proximally. Basilar artery diminutive with multifocal atheromatous irregularity with moderate to severe multifocal stenoses at its mid and distal aspect (series 109, image 9). Superior cerebral arteries are patent bilaterally. Fetal type origin of the PCAs supplied via widely patent posterior communicating arteries. Small hypoplastic left P1 segment noted. PCAs grossly patent to their distal aspects, although not well evaluated due to motion artifact. No aneurysm or other vascular malformation. MRA NECK FINDINGS Visualized aortic arch of normal caliber with normal branch pattern. No high-grade stenosis about the origin of the great vessels. Visualized subclavian arteries widely patent. Right common and internal carotid arteries are widely patent without high-grade or flow-limiting stenosis. Mild atheromatous irregularity about the right carotid bifurcation without significant  stenosis. Left common internal carotid arteries are widely patent without high-grade or flow-limiting stenosis. Mild atheromatous irregularity about the left carotid bifurcation without significant stenosis. Dominant right vertebral artery rises from the right subclavian artery. Origin of the right vertebral artery not well evaluated on this exam, with possible superimposed stenosis not excluded (series 100, image 6). Right V1 and V2 segments are widely patent distally. Right V3 segment not well evaluated, but appears grossly patent on source imaging. Left vertebral artery occluded just distal to its origin, and remains occluded within the neck. Visible flow within the intracranial left V4 segment as detailed on the MRA head portion of this exam. IMPRESSION: MRI HEAD IMPRESSION: 1. No acute intracranial process identified. 2. Generalized age-related cerebral atrophy with mild to moderate chronic small vessel ischemic disease. MRA HEAD IMPRESSION: 1. Fetal type origin of the PCAs with secondary diminutive vertebrobasilar system. 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 Left vertebral artery is occluded within the neck (see below). Intracranial left V4 segment is opacified, which may be via collateralization and/or retrograde flow across the vertebrobasilar junction. Dominant right vertebral artery patent without significant stenosis. A component of vertebrobasilar insufficiency may be contributing to patient's symptoms. 2. Widely patent anterior circulation without stenosis or large vessel occlusion. 3. Distal small vessel atheromatous irregularity. MRA NECK IMPRESSION: 1. Occlusion of the left vertebral artery at its origin, which remains occluded throughout the neck. Dominant right vertebral artery patent without obvious stenosis, although the origin of the right vertebral artery not well evaluated on this exam. 2. Widely patent carotid artery  systems bilaterally without flow-limiting stenosis. Electronically Signed   By: Jeannine Boga M.D.   On: 10/22/2016 14:44   US Abdomen Limited Ruq  Result Date: 10/23/2016 CLINICAL DATA:  82 year old male with elevated LFTs. History of cholecystectomy. EXAM: US ABDOMEN LIMITED - RIGHT UPPER QUADRANT COMPARISON:  09/17/2006 CT FINDINGS: Gallbladder: Not visualized compatible with cholecystectomy. Common bile duct: Diameter: 7.9 mm. Mild fullness of the intrahepatic biliary system noted. There is no evidence of CBD dilatation. Liver: No focal lesion identified. Within normal limits in parenchymal echogenicity. IMPRESSION: Mild fullness of the intrahepatic biliary system without CBD dilatation. No other hepatic abnormalities. Electronically Signed   By: Dellis Filbert  Hu M.D.   On: 10/23/2016 08:16      Management plans discussed with the patient, family and they are in agreement.  CODE STATUS:     Code Status Orders        Start     Ordered   10/22/16 0041  Full code  Continuous     10/22/16 0040    Code Status History    Date Active Date Inactive Code Status Order ID Comments User Context   10/14/2016 12:01 PM 10/14/2016  7:25 PM Full Code [id number]  Algernon Huxley, MD Inpatient   08/30/2016  1:38 PM 09/06/2016  7:41 PM Full Code [id number]  Epifanio Lesches, MD Inpatient   05/19/2016  4:54 AM 05/22/2016  7:07 PM Full Code [id number]  Saundra Shelling, MD Inpatient      TOTAL TIME TAKING CARE OF THIS PATIENT: 40 minutes.    Henreitta Leber M.D on 10/24/2016 at 9:42 AM  Between 7am to 6pm - Pager - [phone]  After 6pm go to [email or web address] - Proofreader  [facility]  Office  [phone]  CC: Primary care physician; System, Provider Not In

## 2016-10-24 NOTE — Progress Notes (Signed)
Pt discharged via non-emergent [facility] to [facility]. Report called to accepting RN. IV removed, pt on room air and in no distress. Appollonia Klee S Fenton, RN 

## 2016-10-25 ENCOUNTER — Encounter (INDEPENDENT_AMBULATORY_CARE_PROVIDER_SITE_OTHER): Payer: Medicare Other

## 2016-10-25 ENCOUNTER — Ambulatory Visit (INDEPENDENT_AMBULATORY_CARE_PROVIDER_SITE_OTHER): Payer: Medicare Other | Admitting: Vascular Surgery

## 2016-11-25 ENCOUNTER — Encounter (INDEPENDENT_AMBULATORY_CARE_PROVIDER_SITE_OTHER): Payer: Medicare Other | Admitting: Vascular Surgery

## 2016-11-25 ENCOUNTER — Encounter (INDEPENDENT_AMBULATORY_CARE_PROVIDER_SITE_OTHER): Payer: Medicare Other

## 2016-11-29 ENCOUNTER — Encounter: Payer: Medicare Other | Attending: Physician Assistant | Admitting: Physician Assistant

## 2016-11-29 DIAGNOSIS — Z87891 Personal history of nicotine dependence: Secondary | ICD-10-CM | POA: Diagnosis not present

## 2016-11-29 DIAGNOSIS — M1990 Unspecified osteoarthritis, unspecified site: Secondary | ICD-10-CM | POA: Diagnosis not present

## 2016-11-29 DIAGNOSIS — E11621 Type 2 diabetes mellitus with foot ulcer: Secondary | ICD-10-CM | POA: Insufficient documentation

## 2016-11-29 DIAGNOSIS — G4730 Sleep apnea, unspecified: Secondary | ICD-10-CM | POA: Diagnosis not present

## 2016-11-29 DIAGNOSIS — I110 Hypertensive heart disease with heart failure: Secondary | ICD-10-CM | POA: Diagnosis not present

## 2016-11-29 DIAGNOSIS — I4891 Unspecified atrial fibrillation: Secondary | ICD-10-CM | POA: Insufficient documentation

## 2016-11-29 DIAGNOSIS — E1151 Type 2 diabetes mellitus with diabetic peripheral angiopathy without gangrene: Secondary | ICD-10-CM | POA: Diagnosis not present

## 2016-11-29 DIAGNOSIS — Z7984 Long term (current) use of oral hypoglycemic drugs: Secondary | ICD-10-CM | POA: Diagnosis not present

## 2016-11-29 DIAGNOSIS — E1140 Type 2 diabetes mellitus with diabetic neuropathy, unspecified: Secondary | ICD-10-CM | POA: Insufficient documentation

## 2016-11-29 DIAGNOSIS — L97522 Non-pressure chronic ulcer of other part of left foot with fat layer exposed: Secondary | ICD-10-CM | POA: Insufficient documentation

## 2016-11-29 DIAGNOSIS — Z89422 Acquired absence of other left toe(s): Secondary | ICD-10-CM | POA: Insufficient documentation

## 2016-11-29 DIAGNOSIS — Y839 Surgical procedure, unspecified as the cause of abnormal reaction of the patient, or of later complication, without mention of misadventure at the time of the procedure: Secondary | ICD-10-CM | POA: Diagnosis not present

## 2016-11-29 DIAGNOSIS — I509 Heart failure, unspecified: Secondary | ICD-10-CM | POA: Diagnosis not present

## 2016-11-29 DIAGNOSIS — T8131XA Disruption of external operation (surgical) wound, not elsewhere classified, initial encounter: Secondary | ICD-10-CM | POA: Diagnosis not present

## 2016-11-29 DIAGNOSIS — Z7901 Long term (current) use of anticoagulants: Secondary | ICD-10-CM | POA: Insufficient documentation

## 2016-11-30 NOTE — Progress Notes (Signed)
[name], [name] ([id number]) Visit Report for 11/29/2016 Chief Complaint Document Details Patient Name: [name], [name] Date of Service: 11/29/2016 12:45 PM Medical Record Number: [id number] Patient Account Number: [id number] Date of Birth/Sex: [birth year] (82 y.o. Male) Treating RN: Cornell Barman Primary Care Provider: Alanson Aly Other Clinician: Referring Provider: Albertine Patricia Treating Provider/Extender: Melburn Hake, Natane Heward Weeks in Treatment: 0 Information Obtained from: Patient Chief Complaint Non-healing left foot surgical ulcer Electronic Signature(s) Signed: 11/29/2016 4:54:08 PM By: Worthy Keeler PA-C Entered By: Worthy Keeler on 11/29/2016 16:36:03 [name] ([id number]) -------------------------------------------------------------------------------- HPI Details Patient Name: Jesse Floor A. Date of Service: 11/29/2016 12:45 PM Medical Record Number: [id number] Patient Account Number: [id number] Date of Birth/Sex: [birth year] (82 y.o. Male) Treating RN: Cornell Barman Primary Care Provider: Alanson Aly Other Clinician: Referring Provider: Albertine Patricia Treating Provider/Extender: Melburn Hake, Johnnie Moten Weeks in Treatment: 0 History of Present Illness Location: Left foot surgical site in the distal/lateral region Quality: Patient describes the pain a sore/burning sensation Severity: 3/10 at rest and 8/10 with manipulation Duration: Patientos surgery was on 10/15/16 with Dr. Albertine Patricia DPM Context: Patient had an amputation of the left 3rd - 5th toes on 10/15/16 and the surgical site has not healed following Modifying Factors: Currently this has been packed with saline soaked gauze in a wet to dry format Associated Signs and Symptoms: HTN, DM, A-fib, chronic Coumadin use HPI Description: 11/29/16 patient presents today for evaluation concerning a nonhealing surgical wound following amputation of his left third through fifth toes which was performed on  10/15/16. Unfortunately following this amputation the circle side has not closed appropriately and he continues to have a significant amount of discomfort. He did have an x-ray performed on 10/21/16 which showed him being status post D3 toe amputation but otherwise no acute findings. Patient also has venous imaging of the left lower extremity which revealed no evidence of DVT on 10/22/16. His last hemoglobin A 1C was 8.5 and this was on 10/22/16 and prior to his amputation patient did have vascular surgery with Dr. Leotis Pain where he had a catheter placed in the left anterior tibialis/dorsalis pedis artery from the right femoral approach. Percutaneous transluminal angioplasty of the left anterior tibial artery/dorsalis pedis artery was performed. This procedure apparently went very well. Unfortunately his healing just has not been what both Dr. Elvina Mattes nor himself were expecting or wanting. The question posed to [place] today is whether hyperbaric therapy would be of benefit for him. Electronic Signature(s) Signed: 11/29/2016 4:54:08 PM By: Worthy Keeler PA-C Entered By: Worthy Keeler on 11/29/2016 16:47:21 [name] ([id number]) -------------------------------------------------------------------------------- Physical Exam Details Patient Name: Jesse Floor A. Date of Service: 11/29/2016 12:45 PM Medical Record Number: [id number] Patient Account Number: [id number] Date of Birth/Sex: 1935/03/19 (82 y.o. Male) Treating RN: Cornell Barman Primary Care Provider: Alanson Aly Other Clinician: Referring Provider: Albertine Patricia Treating Provider/Extender: Melburn Hake, Jaqueline Uber Weeks in Treatment: 0 Constitutional sitting or standing blood pressure is within target range for patient.. pulse regular and within target range for patient.Marland Kitchen respirations regular, non-labored and within target range for patient.Marland Kitchen temperature within target range for patient.. Well-nourished and well-hydrated in no acute  distress. Eyes conjunctiva clear no eyelid edema noted. pupils equal round and reactive to light and accommodation. Ears, Nose, Mouth, and Throat no gross abnormality of ear auricles or external auditory canals. normal hearing noted during conversation. mucus membranes moist. Respiratory normal breathing without difficulty. clear to auscultation bilaterally. Cardiovascular regular rate and rhythm with normal S1, S2.  1+ dorsalis pedis/posterior tibialis pulses. trace pitting edema of the bilateral lower extremities. Gastrointestinal (GI) soft, non-tender, non-distended, +BS. no ventral hernia noted. Musculoskeletal normal gait and posture. no significant deformity or arthritic changes, no loss or range of motion, no clubbing. Psychiatric this patient is able to make decisions and demonstrates good insight into disease process. Alert and Oriented x 3. pleasant and cooperative. Notes Patient's wound exhibits a significant amount of slough and necrotic tissue including eschar overlying the wound bed at the surgical site of the left foot. Patient has significant tenderness with some mild erythema noted as well. Electronic Signature(s) Signed: 11/29/2016 4:54:08 PM By: Worthy Keeler PA-C Entered By: Worthy Keeler on 11/29/2016 16:49:01 [name] ([id number]) -------------------------------------------------------------------------------- Physician Orders Details Patient Name: Jesse Floor A. Date of Service: 11/29/2016 12:45 PM Medical Record Number: [id number] Patient Account Number: [id number] Date of Birth/Sex: [birth year] (82 y.o. Male) Treating RN: Cornell Barman Primary Care Provider: Alanson Aly Other Clinician: Referring Provider: Albertine Patricia Treating Provider/Extender: Melburn Hake, Sharissa Brierley Weeks in Treatment: 0 Verbal / Phone Orders: No Diagnosis Coding Wound Cleansing Wound #1 Left Amputation Site - Digit o Clean wound with Normal Saline. Anesthetic Wound  #1 Left Amputation Site - Digit o Topical Lidocaine 4% cream applied to wound bed prior to debridement Primary Wound Dressing Wound #1 Left Amputation Site - Digit o Aquacel Ag Secondary Dressing Wound #1 Left Amputation Site - Digit o ABD and Kerlix/Conform Dressing Change Frequency Wound #1 Left Amputation Site - Digit o Change Dressing Monday, Wednesday, Friday Follow-up Appointments o Other: - Return to Dr. Elvina Mattes (still in global) Edema Control Wound #1 Left Amputation Site - Digit o Elevate legs to the level of the heart and pump ankles as often as possible Off-Loading Wound #1 Left Amputation Site - Digit o Other: - front offloading shoe Discharge From [facility] Wound #1 Left Amputation Site - Digit o Discharge from [place] - Refer back to Dr. Elvina Mattes for debridement [name] ([id number]) Notes I discussed with patient today but I do not feel that hyperbaric oxygen therapy would be of benefit for this wound. Following my evaluation I feel that the most appropriate course of action would likely be surgical debridement which would need to be done under anesthesia. He is much too tender to attempt this in the office today. There may potentially also be other revision measures that need to be undertaken but I explained that I would recommend and the furthest decisions Dr. Elvina Mattes. My suggestion was for the patient to go ahead and contact Dr. Selina Cooley office for a follow-up evaluation and explain what was discussed here in the office today as well. We will begin with treatment utilizing silver alginate for the time being and then will see the patient back in the future as needed following Dr. Selina Cooley evaluation. Otherwise no future wound care appointments will be scheduled at this point to the wound center. Electronic Signature(s) Signed: 11/29/2016 4:54:08 PM By: Worthy Keeler PA-C Entered By: Worthy Keeler on 11/29/2016  16:51:53 [name] ([id number]) -------------------------------------------------------------------------------- Problem List Details Patient Name: Jesse Floor A. Date of Service: 11/29/2016 12:45 PM Medical Record Number: [id number] Patient Account Number: [id number] Date of Birth/Sex: 08-01-1934 (82 y.o. 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 Date of Service: 11/29/2016 12:45 PM Medical Record Number: [id number] Patient Account Number: [id number] Date of Birth/Sex: [birth year] (82 y.o. Male) Treating RN: Cornell Barman Primary Care Provider: Alanson Aly Other Clinician: Referring Provider: Albertine Patricia Treating Provider/Extender: Melburn Hake, Chuckie Mccathern Weeks in Treatment: 0 Subjective Chief Complaint Information obtained from Patient Non-healing left foot surgical ulcer History of Present Illness (HPI) The following HPI elements were documented for the patient's wound: Location: Left foot surgical site in the distal/lateral region Quality: Patient describes the pain a sore/burning  sensation Severity: 3/10 at rest and 8/10 with manipulation Duration: Patient s surgery was on 10/15/16 with Dr. Albertine Patricia DPM Context: Patient had an amputation of the left 3rd - 5th toes on 10/15/16 and the surgical site has not healed following Modifying Factors: Currently this has been packed with saline soaked gauze in a wet to dry format Associated Signs and Symptoms: HTN, DM, A-fib, chronic Coumadin use 11/29/16 patient presents today for evaluation concerning a nonhealing surgical wound following amputation of his left third through fifth toes which was performed on 10/15/16. Unfortunately following this amputation the circle side has not closed appropriately and he continues to have a significant amount of discomfort. He did have an x-ray performed on 10/21/16 which showed him being status post D3 toe amputation but otherwise no acute findings. Patient also has venous imaging of the left lower extremity which revealed no evidence of DVT on 10/22/16. His last hemoglobin A 1C was 8.5 and this was on 10/22/16 and prior to his amputation patient did have vascular surgery with Dr. Leotis Pain where he had a catheter placed in the left anterior tibialis/dorsalis pedis artery from the right femoral approach. Percutaneous transluminal angioplasty of the left anterior tibial artery/dorsalis pedis artery was performed. This procedure apparently went very well. Unfortunately his healing just has not been what both Dr. Elvina Mattes nor himself were expecting or wanting. The question posed to [place] today is whether hyperbaric therapy would be of benefit for him. Wound History Patient presents with 1 open wound that has been present for approximately 2 months. Patient has been treating wound in the following manner: wet to dry. Laboratory tests have not been performed in the last month. Patient reportedly has not tested positive for an antibiotic resistant organism. Patient reportedly has not tested positive for  osteomyelitis. Patient reportedly has had testing performed to evaluate circulation in the legs. Patient History Information obtained from Patient, Caregiver. [name], [name] ([id number]) Allergies Iodinated Contrast- Oral and IV Dye, latex Family History Cancer - Siblings, Hypertension - Siblings, Stroke - Siblings, No family history of Diabetes, Heart Disease, Kidney Disease, Lung Disease, Seizures, Thyroid Problems, Tuberculosis. Social History Former smoker, Marital Status - Married, Alcohol Use - Never, Drug Use - No History, Caffeine Use - Daily. Medical History Eyes Patient has history of Cataracts - removed Denies history of Glaucoma, Optic Neuritis Ear/Nose/Mouth/Throat Denies history of Chronic sinus problems/congestion, Middle ear problems Hematologic/Lymphatic Denies history of Anemia, Hemophilia, Human Immunodeficiency Virus, Lymphedema, Sickle Cell Disease Respiratory Patient has history of Sleep Apnea - c-pap Denies history of  Aspiration, Asthma, Chronic Obstructive Pulmonary Disease (COPD), Pneumothorax, Tuberculosis Cardiovascular Patient has history of Arrhythmia, Congestive Heart Failure, Hypertension, Peripheral Arterial Disease Denies history of Angina, Coronary Artery Disease, Deep Vein Thrombosis, Hypotension, Myocardial Infarction, Peripheral Venous Disease, Phlebitis, Vasculitis Gastrointestinal Denies history of Cirrhosis , Colitis, Crohn s, Hepatitis A, Hepatitis B, Hepatitis C Endocrine Patient has history of Type II Diabetes Denies history of Type I Diabetes Genitourinary Denies history of End Stage Renal Disease Immunological Denies history of Lupus Erythematosus, Raynaud s, Scleroderma Integumentary (Skin) Denies history of History of Burn, History of pressure wounds Musculoskeletal Patient has history of Osteoarthritis Denies history of Gout, Rheumatoid Arthritis, Osteomyelitis Neurologic Denies history of Dementia, Neuropathy,  Quadriplegia, Paraplegia, Seizure Disorder Oncologic Denies history of Received Chemotherapy, Received Radiation Psychiatric Denies history of Anorexia/bulimia, Confinement Anxiety Patient is treated with Oral Agents. Blood sugar is not tested. Blood sugar results noted at the following times: Breakfast - 180. [name], [name] ([id number]) Medical And Surgical History Notes Constitutional Symptoms (General Health) HTN; Diabetes Type II, A-Fib; Neuropathy Review of Systems (ROS) Constitutional Symptoms (General Health) The patient has no complaints or symptoms. Eyes The patient has no complaints or symptoms. Ear/Nose/Mouth/Throat The patient has no complaints or symptoms. Hematologic/Lymphatic The patient has no complaints or symptoms. Respiratory The patient has no complaints or symptoms. Cardiovascular The patient has no complaints or symptoms. Gastrointestinal The patient has no complaints or symptoms. Endocrine The patient has no complaints or symptoms. Genitourinary The patient has no complaints or symptoms. Immunological The patient has no complaints or symptoms. Integumentary (Skin) Complains or has symptoms of Wounds, Bleeding or bruising tendency. Denies complaints or symptoms of Breakdown, Swelling. Musculoskeletal The patient has no complaints or symptoms. Neurologic The patient has no complaints or symptoms. Oncologic The patient has no complaints or symptoms. Psychiatric The patient has no complaints or symptoms. Objective Constitutional sitting or standing blood pressure is within target range for patient.. pulse regular and within target range for Parrella, Demani A. ([id number]) patient.Marland Kitchen respirations regular, non-labored and within target range for patient.Marland Kitchen temperature within target range for patient.. Well-nourished and well-hydrated in no acute distress. Vitals Time Taken: 1:10 PM, Height: 67 in, Weight: 190 lbs, BMI: 29.8, Temperature: 97.5 F,  Pulse: 72 bpm, Respiratory Rate: 16 breaths/min, Blood Pressure: 104/77 mmHg. Eyes conjunctiva clear no eyelid edema noted. pupils equal round and reactive to light and accommodation. Ears, Nose, Mouth, and Throat no gross abnormality of ear auricles or external auditory canals. normal hearing noted during conversation. mucus membranes moist. Respiratory normal breathing without difficulty. clear to auscultation bilaterally. Cardiovascular regular rate and rhythm with normal S1, S2. 1+ dorsalis pedis/posterior tibialis pulses. trace pitting edema of the bilateral lower extremities. Gastrointestinal (GI) soft, non-tender, non-distended, +BS. no ventral hernia noted. Musculoskeletal normal gait and posture. no significant deformity or arthritic changes, no loss or range of motion, no clubbing. Psychiatric this patient is able to make decisions and demonstrates good insight into disease process. Alert and Oriented x 3. pleasant and cooperative. General Notes: Patient's wound exhibits a significant amount of slough and necrotic tissue including eschar overlying the wound bed at the surgical site of the left foot. Patient has significant tenderness with some mild erythema noted as well. Integumentary (Hair, Skin) Wound #1 status is Open. Original cause of wound was Pimple. The wound is located on the Left Amputation Site - Digit. The wound measures 2.5cm length x 2.2cm width x 1.8cm depth; 4.32cm^2 area and 7.775cm^3 volume. There is Fat Layer (Subcutaneous Tissue) Exposed exposed.  There is a large amount of purulent drainage noted. The wound margin is flat and intact. There is no granulation within the wound bed. There is a large (67-100%) amount of necrotic tissue within the wound bed including Eschar. The periwound skin appearance exhibited: Induration. The periwound skin appearance did not exhibit: Callus, Crepitus, Excoriation, Rash, Scarring, Dry/Scaly, Maceration, Atrophie Blanche,  Cyanosis, Ecchymosis, Hemosiderin Staining, Mottled, Pallor, Rubor, Erythema. General Notes: Necrotic tissue and soupy slough. Patient extremely sensitive to any touch. Peri-wound boggy. [name], [name] 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 Anesthetic: Wound #1 Left Amputation Site - Digit: Topical Lidocaine 4% cream applied to wound bed prior to debridement Primary Wound Dressing: Wound #1 Left Amputation Site - Digit: Aquacel Ag Secondary Dressing: Wound #1 Left Amputation Site - Digit: ABD and Kerlix/Conform Dressing Change Frequency: Wound #1 Left Amputation Site - Digit: Change Dressing Monday, Wednesday, Friday Follow-up Appointments: Other: - Return to Dr. Elvina Mattes (still in global) Edema Control: Wound #1 Left Amputation Site - Digit: Elevate legs to the level of the heart and pump ankles as often as possible Off-Loading: Wound #1 Left Amputation Site - Digit: Other: - front offloading shoe Discharge From [facility] Services: Wound #1 Left Amputation Site - Digit: Discharge from [place] - Refer back to Dr. Elvina Mattes for debridement General Notes: I discussed with patient today but I do not feel that hyperbaric oxygen therapy would be of benefit for this wound. Following my evaluation I feel that the most appropriate course of action would likely be surgical debridement which would need to be done under anesthesia. He is much too tender to attempt this in the office today. There may potentially also be other revision measures that need to be undertaken [name], [name] ([id number]) but  I explained that I would recommend and the furthest decisions Dr. Elvina Mattes. My suggestion was for the patient to go ahead and contact Dr. Selina Cooley office for a follow-up evaluation and explain what was discussed here in the office today as well. We will begin with treatment utilizing silver alginate for the time being and then will see the patient back in the future as needed following Dr. Selina Cooley evaluation. Otherwise no future wound care appointments will be scheduled at this point to the wound center. Electronic Signature(s) Signed: 11/29/2016 4:54:08 PM By: Worthy Keeler PA-C Entered By: Worthy Keeler on 11/29/2016 16:52:16 [name] ([id number]) -------------------------------------------------------------------------------- ROS/PFSH Details Patient Name: Jesse Floor A. Date of Service: 11/29/2016 12:45 PM Medical Record Number: [id number] Patient Account Number: [id number] Date of Birth/Sex: [birth year] (82 y.o. Male) Treating RN: Cornell Barman Primary Care Provider: Alanson Aly Other Clinician: Referring Provider: Albertine Patricia Treating Provider/Extender: Melburn Hake, Tierany Appleby Weeks in Treatment: 0 Information Obtained From Patient Caregiver Wound History Do you currently have one or more open woundso Yes How many open wounds do you currently haveo 1 Approximately how long have you had your woundso 2 months How have you been treating your wound(s) until nowo wet to dry Has your wound(s) ever healed and then re-openedo No Have you had any lab work done in the past montho No Have you tested positive for an antibiotic resistant organism (MRSA,  VRE)o No Have you tested positive for osteomyelitis (bone infection)o No Have you had any tests for circulation on your legso Yes Where was the test doneo [facility] Integumentary (Skin) Complaints and Symptoms: Positive for: Wounds; Bleeding or bruising tendency Negative for: Breakdown; Swelling Medical History: Negative for:  History of Burn; History of pressure wounds Constitutional Symptoms (General Health) Complaints and Symptoms: No Complaints or Symptoms Medical History: Past Medical History Notes: HTN; Diabetes Type II, A-Fib; Neuropathy Eyes Complaints and Symptoms: No Complaints or Symptoms Medical History: Positive for: Cataracts - removed Negative for: Glaucoma; Optic Neuritis [name], [name] ([id number]) Ear/Nose/Mouth/Throat Complaints and Symptoms: No Complaints or Symptoms Medical History: Negative for: Chronic sinus problems/congestion; Middle ear problems Hematologic/Lymphatic Complaints and Symptoms: No Complaints or Symptoms Medical History: Negative for: Anemia; Hemophilia; Human Immunodeficiency Virus; Lymphedema; Sickle Cell Disease Respiratory Complaints and Symptoms: No Complaints or Symptoms Medical History: Positive for: Sleep Apnea - c-pap Negative for: Aspiration; Asthma; Chronic Obstructive Pulmonary Disease (COPD); Pneumothorax; Tuberculosis Cardiovascular Complaints and Symptoms: No Complaints or Symptoms Medical History: Positive for: Arrhythmia; Congestive Heart Failure; Hypertension; Peripheral Arterial Disease Negative for: Angina; Coronary Artery Disease; Deep Vein Thrombosis; Hypotension; Myocardial Infarction; Peripheral Venous Disease; Phlebitis; Vasculitis Gastrointestinal Complaints and Symptoms: No Complaints or Symptoms Medical History: Negative for: Cirrhosis ; Colitis; Crohnos; Hepatitis A; Hepatitis B; Hepatitis C Endocrine Complaints and Symptoms: No Complaints or Symptoms Medical History: Positive for: Type II Diabetes Negative for: Type I Diabetes [name], [name] ([id number]) Time with diabetes: 30 years Treated with: Oral agents Blood sugar tested every day: No Blood sugar testing results: Breakfast: 180 Genitourinary Complaints and Symptoms: No Complaints or Symptoms Medical History: Negative for: End Stage Renal  Disease Immunological Complaints and Symptoms: No Complaints or Symptoms Medical History: Negative for: Lupus Erythematosus; Raynaudos; Scleroderma Musculoskeletal Complaints and Symptoms: No Complaints or Symptoms Medical History: Positive for: Osteoarthritis Negative for: Gout; Rheumatoid Arthritis; Osteomyelitis Neurologic Complaints and Symptoms: No Complaints or Symptoms Medical History: Negative for: Dementia; Neuropathy; Quadriplegia; Paraplegia; Seizure Disorder Oncologic Complaints and Symptoms: No Complaints or Symptoms Medical History: Negative for: Received Chemotherapy; Received Radiation Psychiatric Complaints and Symptoms: No Complaints or Symptoms [name], [name] ([id number]) Medical History: Negative for: Anorexia/bulimia; Confinement Anxiety HBO Extended History Items Eyes: Cataracts Immunizations Pneumococcal Vaccine: Received Pneumococcal Vaccination: Yes Family and Social History Cancer: Yes - Siblings; Diabetes: No; Heart Disease: No; Hypertension: Yes - Siblings; Kidney Disease: No; Lung Disease: No; Seizures: No; Stroke: Yes - Siblings; Thyroid Problems: No; Tuberculosis: No; Former smoker; Marital Status - Married; Alcohol Use: Never; Drug Use: No History; Caffeine Use: Daily; Advanced Directives: No; Patient does not want information on Advanced Directives; Do not resuscitate: No; Living Will: No; Medical Power of Attorney: No Electronic Signature(s) Signed: 11/29/2016 4:54:08 PM By: Worthy Keeler PA-C Signed: 11/29/2016 5:19:54 PM By: Gretta Cool, BSN, RN, CWS, Kim RN, BSN Entered By: Gretta Cool, BSN, RN, CWS, Kim on 11/29/2016 13:20:39 [name] ([id number]) -------------------------------------------------------------------------------- SuperBill Details Patient Name: Jesse Floor A. Date of Service: 11/29/2016 Medical Record Number: [id number] Patient Account Number: [id number] Date of Birth/Sex: Jul 20, 1934 (82 y.o. Male) Treating RN:  Cornell Barman Primary Care Provider: Alanson Aly Other Clinician: Referring Provider: Albertine Patricia Treating Provider/Extender: Melburn Hake, Charise Leinbach Weeks in Treatment: 0 Diagnosis Coding ICD-10 Codes Code Description Disruption of external operation (surgical) wound, not elsewhere classified, initial T81.31XA encounter E11.621 Type 2 diabetes mellitus with foot ulcer L97.522 Non-pressure chronic ulcer of other part of left foot with fat layer exposed I10 Essential (primary) hypertension Facility Procedures CPT4  Code: 73958441 Description: 99214 - WOUND CARE VISIT-LEV 4 EST PT Modifier: Quantity: 1 Physician Procedures CPT4: Description Modifier Quantity Code 7127871 83672 - WC PHYS LEVEL 4 - EST PT 1 ICD-10 Description Diagnosis T81.31XA Disruption of external operation (surgical) wound, not elsewhere classified, initial encounter E11.621 Type 2 diabetes mellitus with  foot ulcer L97.522 Non-pressure chronic ulcer of other part of left foot with fat layer exposed I10 Essential (primary) hypertension Electronic Signature(s) Signed: 11/29/2016 4:54:08 PM By: Worthy Keeler PA-C Entered By: Worthy Keeler on 11/29/2016 16:52:44

## 2016-11-30 NOTE — Progress Notes (Addendum)
WALDRON, GERRY ([id number]) Visit Report for 11/29/2016 Allergy List Details Patient Name: [name], [name] Date of Service: 11/29/2016 12:45 PM Medical Record Number: [id number] Patient Account Number: [id number] Date of Birth/Sex: Oct 24, 1934 (82 y.o. Male) Treating RN: Cornell Barman Primary Care Kalayla Shadden: Alanson Aly Other Clinician: Referring Abella Shugart: Albertine Patricia Treating Lino Wickliff/Extender: Melburn Hake, HOYT Weeks in Treatment: 0 Allergies Active Allergies Iodinated Contrast- Oral and IV Dye latex Allergy Notes Electronic Signature(s) Signed: 11/29/2016 5:19:54 PM By: Gretta Cool, BSN, RN, CWS, Kim RN, BSN Entered By: Gretta Cool, BSN, RN, CWS, Kim on 11/29/2016 13:12:25 [name] ([id number]) -------------------------------------------------------------------------------- Arrival Information Details Patient Name: Jesse Floor A. Date of Service: 11/29/2016 12:45 PM Medical Record Number: [id number] Patient Account Number: [id number] Date of Birth/Sex: [birth year] (82 y.o. Male) Treating RN: Cornell Barman Primary Care Corrin Hingle: Alanson Aly Other Clinician: Referring Carollynn Pennywell: Albertine Patricia Treating Dalynn Jhaveri/Extender: Melburn Hake, HOYT Weeks in Treatment: 0 Visit Information Patient Arrived: Wheel Chair Arrival Time: 13:08 Accompanied By: wife Transfer Assistance: None Patient Identification Verified: Yes Secondary Verification Process Yes Completed: Patient Has Alerts: Yes Patient Alerts: Patient on Blood Thinner Warfarin Diabetic Type II Electronic Signature(s) Signed: 11/29/2016 5:19:54 PM By: Gretta Cool, BSN, RN, CWS, Kim RN, BSN Entered By: Gretta Cool, BSN, RN, CWS, Kim on 11/29/2016 13:09:46 [name] ([id number]) -------------------------------------------------------------------------------- Clinic Level of Care Assessment Details Patient Name: Jesse Floor A. Date of Service: 11/29/2016 12:45 PM Medical Record Number: [id number] Patient Account Number:  [id number] Date of Birth/Sex: [birth year] (82 y.o. Male) Treating RN: Cornell Barman Primary Care Napoleon Monacelli: Alanson Aly Other Clinician: Referring Ginna Schuur: Albertine Patricia Treating Brenleigh Collet/Extender: Melburn Hake, HOYT Weeks in Treatment: 0 Clinic Level of Care Assessment Items TOOL 2 Quantity Score []  - Use when only an EandM is performed on the INITIAL visit 0 ASSESSMENTS - Nursing Assessment / Reassessment X - General Physical Exam (combine w/ comprehensive assessment (listed just 1 20 below) when performed on new pt. evals) X - Comprehensive Assessment (HX, ROS, Risk Assessments, Wounds Hx, etc.) 1 25 ASSESSMENTS - Wound and Skin Assessment / Reassessment X - Simple Wound Assessment / Reassessment - one wound 1 5 []  - Complex Wound Assessment / Reassessment - multiple wounds 0 []  - Dermatologic / Skin Assessment (not related to wound area) 0 ASSESSMENTS - Ostomy and/or Continence Assessment and Care []  - Incontinence Assessment and Management 0 []  - Ostomy Care Assessment and Management (repouching, etc.) 0 PROCESS - Coordination of Care X - Simple Patient / Family Education for ongoing care 1 15 []  - Complex (extensive) Patient / Family Education for ongoing care 0 X - Staff obtains Programmer, systems, Records, Test Results / Process Orders 1 10 []  - Staff telephones HHA, Nursing Homes / Clarify orders / etc 0 []  - Routine Transfer to another Facility (non-emergent condition) 0 []  - Routine Hospital Admission (non-emergent condition) 0 []  - New Admissions / Biomedical engineer / Ordering NPWT, Apligraf, etc. 0 []  - Emergency Hospital Admission (emergent condition) 0 X - Simple Discharge Coordination 1 10 [name], [name] ([id number]) []  - Complex (extensive) Discharge Coordination 0 PROCESS - Special Needs []  - Pediatric / Minor Patient Management 0 []  - Isolation Patient Management 0 []  - Hearing / Language / Visual special needs 0 []  - Assessment of Community assistance  (transportation, D/C planning, etc.) 0 []  - Additional assistance / Altered mentation 0 []  - Support Surface(s) Assessment (bed, cushion, seat, etc.) 0 INTERVENTIONS - Wound Cleansing / Measurement X - Wound Imaging (photographs - any number of wounds) 1 5 []  -  Wound Tracing (instead of photographs) 0 X - Simple Wound Measurement - one wound 1 5 []  - Complex Wound Measurement - multiple wounds 0 X - Simple Wound Cleansing - one wound 1 5 []  - Complex Wound Cleansing - multiple wounds 0 INTERVENTIONS - Wound Dressings []  - Small Wound Dressing one or multiple wounds 0 X - Medium Wound Dressing one or multiple wounds 1 15 []  - Large Wound Dressing one or multiple wounds 0 []  - Application of Medications - injection 0 INTERVENTIONS - Miscellaneous []  - External ear exam 0 []  - Specimen Collection (cultures, biopsies, blood, body fluids, etc.) 0 []  - Specimen(s) / Culture(s) sent or taken to Lab for analysis 0 []  - Patient Transfer (multiple staff / Civil Service fast streamer / Similar devices) 0 []  - Simple Staple / Suture removal (25 or less) 0 []  - Complex Staple / Suture removal (26 or more) 0 [name], [name] ([id number]) []  - Hypo / Hyperglycemic Management (close monitor of Blood Glucose) 0 X - Ankle / Brachial Index (ABI) - do not check if billed separately 1 15 Has the patient been seen at the hospital within the last three years: Yes Total Score: 130 Level Of Care: New/Established - Level 4 Electronic Signature(s) Signed: 11/29/2016 5:19:54 PM By: Gretta Cool, BSN, RN, CWS, Kim RN, BSN Entered By: Gretta Cool, BSN, RN, CWS, Kim on 11/29/2016 14:32:41 [name] ([id number]) -------------------------------------------------------------------------------- Encounter Discharge Information Details Patient Name: Jesse Floor A. Date of Service: 11/29/2016 12:45 PM Medical Record Number: [id number] Patient Account Number: [id number] Date of Birth/Sex: [birth year] (82 y.o. Male) Treating RN: Cornell Barman Primary Care Elayah Klooster: Alanson Aly Other Clinician: Referring Lee Kuang: Albertine Patricia Treating Jendayi Berling/Extender: Melburn Hake, HOYT Weeks in Treatment: 0 Encounter Discharge Information Items Discharge Pain Level: 5 Discharge Condition: Stable Ambulatory Status: Wheelchair Discharge Destination: Home Transportation: Private Auto Accompanied By: wife Schedule Follow-up Appointment: Yes Medication Reconciliation completed Yes and provided to Patient/Care Toyoko Silos: Provided on Clinical Summary of Care: 11/29/2016 Form Type Recipient Paper Patient LS Electronic Signature(s) Signed: 11/29/2016 2:33:53 PM By: Gretta Cool, BSN, RN, CWS, Kim RN, BSN Previous Signature: 11/29/2016 2:25:51 PM Version By: Ruthine Dose Entered By: Gretta Cool BSN, RN, CWS, Kim on 11/29/2016 14:33:52 [name] ([id number]) -------------------------------------------------------------------------------- Lower Extremity Assessment Details Patient Name: Jesse Floor A. Date of Service: 11/29/2016 12:45 PM Medical Record Number: [id number] Patient Account Number: [id number] Date of Birth/Sex: [birth year] (82 y.o. Male) Treating RN: Cornell Barman Primary Care Avyn Aden: Alanson Aly Other Clinician: Referring Najmah Carradine: Albertine Patricia Treating Jalaila Caradonna/Extender: Melburn Hake, HOYT Weeks in Treatment: 0 Vascular Assessment Pulses: Dorsalis Pedis Palpable: [Left:No] Doppler Audible: [Left:Yes] Posterior Tibial Palpable: [Left:No] Doppler Audible: [Left:Yes] Extremity colors, hair growth, and conditions: Extremity Color: [Left:Mottled] Hair Growth on Extremity: [Left:Yes] Temperature of Extremity: [Left:Warm] Capillary Refill: [Left:> 3 seconds] Dependent Rubor: [Left:No] Blanched when Elevated: [Left:No] Lipodermatosclerosis: [Left:No] Toe Nail Assessment Left: Right: Thick: Yes Discolored: Yes Deformed: Yes Improper Length and Hygiene: Yes Notes 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 ABI attempted. Pulse heard  as swooshing sounds. Fades as soon as BP cuff pressure applied. Electronic Signature(s) Signed: 11/29/2016 5:19:54 PM By: Gretta Cool, BSN, RN, CWS, Kim RN, BSN Entered By: Gretta Cool, BSN, RN, CWS, Kim on 11/29/2016 13:47:09 [name] ([id number]) -------------------------------------------------------------------------------- Multi Wound Chart Details Patient Name: Jesse Floor A. Date of Service: 11/29/2016 12:45 PM Medical Record Number: [id number] Patient Account Number: [id number] Date of Birth/Sex: 09-10-1934 (82 y.o. Male) Treating RN: Cornell Barman Primary Care Rini Moffit: Alanson Aly Other Clinician: Referring Nori Winegar: Albertine Patricia Treating Vian Fluegel/Extender: Melburn Hake, HOYT  Weeks in Treatment: 0 Vital Signs Height(in): 67 Pulse(bpm): 72 Weight(lbs): 190 Blood Pressure 104/77 (mmHg): Body Mass Index(BMI): 30 Temperature(F): 97.5 Respiratory Rate 16 (breaths/min): Photos: [N/A:N/A] Wound Location: Left Amputation Site - N/A N/A Digit Wounding Event: Pimple N/A N/A Primary Etiology: Dehisced Wound N/A N/A Comorbid History: Cataracts, Sleep Apnea, N/A N/A Arrhythmia, Congestive Heart Failure, Hypertension, Peripheral Arterial Disease, Type II Diabetes, Osteoarthritis Date Acquired: 10/15/2016 N/A N/A Weeks of Treatment: 0 N/A N/A Wound Status: Open N/A N/A Measurements L x W x D 2.5x2.2x1.8 N/A N/A (cm) Area (cm) : 4.32 N/A N/A Volume (cm) : 7.775 N/A N/A % Reduction in Area: 0.00% N/A N/A % Reduction in Volume: 0.00% N/A N/A Classification: Full Thickness Without N/A N/A Exposed Support Structures Exudate Amount: Large N/A N/A Exudate Type: Purulent N/A N/A Exudate Color: yellow, brown, green N/A N/A [name], [name] ([id number]) Wound Margin: Flat and Intact N/A N/A Granulation Amount: None Present (0%) N/A N/A Necrotic Amount: Large (67-100%) N/A N/A Necrotic Tissue: Eschar N/A N/A Exposed Structures: Fat Layer (Subcutaneous N/A N/A Tissue)  Exposed: Yes Fascia: No Tendon: No Muscle: No Joint: No Bone: No Epithelialization: None N/A N/A Periwound Skin Texture: Induration: Yes N/A N/A Excoriation: No Callus: No Crepitus: No Rash: No Scarring: No Periwound Skin Maceration: No N/A N/A Moisture: Dry/Scaly: No Periwound Skin Color: Atrophie Blanche: No N/A N/A Cyanosis: No Ecchymosis: No Erythema: No Hemosiderin Staining: No Mottled: No Pallor: No Rubor: No Tenderness on No N/A N/A Palpation: Wound Preparation: Ulcer Cleansing: N/A N/A Rinsed/Irrigated with Saline Topical Anesthetic Applied: Other: lidocaine 4% Treatment Notes Electronic Signature(s) Signed: 11/29/2016 5:19:54 PM By: Gretta Cool, BSN, RN, CWS, Kim RN, BSN Entered By: Gretta Cool, BSN, RN, CWS, Kim on 11/29/2016 14:13:26 [name] ([id number]) -------------------------------------------------------------------------------- Multi-Disciplinary Care Plan Details Patient Name: Jesse Floor A. Date of Service: 11/29/2016 12:45 PM Medical Record Number: [id number] Patient Account Number: [id number] Date of Birth/Sex: 1934-12-09 (82 y.o. Male) Treating RN: Cornell Barman Primary Care Ladelle Teodoro: Alanson Aly Other Clinician: Referring Valiant Dills: Albertine Patricia Treating Aster Screws/Extender: Worthy Keeler Weeks in Treatment: 0 Active Inactive Electronic Signature(s) Signed: 12/01/2016 2:27:53 PM By: Gretta Cool, BSN, RN, CWS, Kim RN, BSN Previous Signature: 11/29/2016 5:19:54 PM Version By: Gretta Cool, BSN, RN, CWS, Kim RN, BSN Entered By: Gretta Cool, BSN, RN, CWS, Kim on 12/01/2016 14:27:53 [name] ([id number]) -------------------------------------------------------------------------------- Pain Assessment Details Patient Name: Jesse Floor A. Date of Service: 11/29/2016 12:45 PM Medical Record Number: [id number] Patient Account Number: [id number] Date of Birth/Sex: Jul 02, 1934 (82 y.o. Male) Treating RN: Cornell Barman Primary Care Katilynn Sinkler: Alanson Aly Other Clinician: Referring Casi Westerfeld: Albertine Patricia Treating Nazly Digilio/Extender: Melburn Hake, HOYT Weeks in Treatment: 0 Active Problems Location of Pain Severity and Description of Pain Patient Has Paino Yes Site Locations Pain Location: Pain in Ulcers With Dressing Change: Yes Rate the pain. Current Pain Level: 5 Worst Pain Level: 9 Character of Pain Describe the Pain: Burning, Shooting, Throbbing Pain Management and Medication Current Pain Management: Medication: Yes Goals for Pain Management Topical or injectable lidocaine is offered to patient for acute pain when surgical debridement is performed. If needed, Patient is instructed to use over the counter pain medication for the following 24-48 hours after debridement. Wound care MDs do not prescribed pain medications. Patient has chronic pain or uncontrolled pain. Patient has been instructed to make an appointment with their Primary Care Physician for pain management. Electronic Signature(s) Signed: 11/29/2016 5:19:54 PM By: Gretta Cool, BSN, RN, CWS, Kim RN, BSN Entered By: Gretta Cool, BSN, RN, CWS, Kim on 11/29/2016  13:10:20 [name], [name] ([id number]) -------------------------------------------------------------------------------- Patient/Caregiver Education Details Patient Name: Jesse Floor A. Date of Service: 11/29/2016 12:45 PM Medical Record Number: [id number] Patient Account Number: [id number] Date of Birth/Gender: 06-Sep-1934 (82 y.o. Male) Treating RN: Cornell Barman Primary Care Physician: Alanson Aly Other Clinician: Referring Physician: Albertine Patricia Treating Physician/Extender: Sharalyn Ink in Treatment: 0 Education Assessment Education Provided To: Patient Education Topics Provided Wound/Skin Impairment: Handouts: Caring for Your Ulcer, Other: referring back to Dr. Elvina Mattes for debridement Methods: Demonstration, Explain/Verbal Responses: State content correctly Electronic  Signature(s) Signed: 11/29/2016 5:19:54 PM By: Gretta Cool, BSN, RN, CWS, Kim RN, BSN Entered By: Gretta Cool, BSN, RN, CWS, Kim on 11/29/2016 14:34:23 [name] ([id number]) -------------------------------------------------------------------------------- Wound Assessment Details Patient Name: Jesse Floor A. Date of Service: 11/29/2016 12:45 PM Medical Record Number: [id number] Patient Account Number: [id number] Date of Birth/Sex: 23-Aug-1934 (82 y.o. Male) Treating RN: Cornell Barman Primary Care Denarius Sesler: Alanson Aly Other Clinician: Referring Zury Fazzino: Albertine Patricia Treating Anothony Bursch/Extender: Melburn Hake, HOYT Weeks in Treatment: 0 Wound Status Wound Number: 1 Primary Dehisced Wound Etiology: Wound Location: Left Amputation Site - Digit Wound Open Wounding Event: Pimple Status: Date Acquired: 10/15/2016 Comorbid Cataracts, Sleep Apnea, Arrhythmia, Weeks Of Treatment: 0 History: Congestive Heart Failure, Hypertension, Clustered Wound: No Peripheral Arterial Disease, Type II Diabetes, Osteoarthritis Photos Wound Measurements Length: (cm) 2.5 Width: (cm) 2.2 Depth: (cm) 1.8 Area: (cm) 4.32 Volume: (cm) 7.775 % Reduction in Area: 0% % Reduction in Volume: 0% Epithelialization: None Wound Description Full Thickness Without Exposed Classification: Support Structures Wound Margin: Flat and Intact Exudate Large Amount: Exudate Type: Purulent Exudate Color: yellow, brown, green Foul Odor After Cleansing: No Slough/Fibrino Yes Wound Bed Granulation Amount: None Present (0%) Exposed Structure Necrotic Amount: Large (67-100%) Fascia Exposed: No Necrotic Quality: Eschar Fat Layer (Subcutaneous Tissue) Exposed: Yes Tendon Exposed: No [name], [name] ([id number]) Muscle Exposed: No Joint Exposed: No Bone Exposed: No Periwound Skin Texture Texture Color No Abnormalities Noted: No No Abnormalities Noted: No Callus: No Atrophie Blanche: No Crepitus:  No Cyanosis: No Excoriation: No Ecchymosis: No Induration: Yes Erythema: No Rash: No Hemosiderin Staining: No Scarring: No Mottled: No Pallor: No Moisture Rubor: No No Abnormalities Noted: No Dry / Scaly: No Maceration: No Wound Preparation Ulcer Cleansing: Rinsed/Irrigated with Saline Topical Anesthetic Applied: Other: lidocaine 4%, Assessment Notes Necrotic tissue and soupy slough. Patient extremely sensitive to any touch. Peri-wound boggy. Electronic Signature(s) Signed: 11/29/2016 2:37:07 PM By: Gretta Cool, BSN, RN, CWS, Kim RN, BSN Entered By: Gretta Cool, BSN, RN, CWS, Kim on 11/29/2016 14:37:07 [name] ([id number]) -------------------------------------------------------------------------------- Vitals Details Patient Name: Jesse Floor A. Date of Service: 11/29/2016 12:45 PM Medical Record Number: [id number] Patient Account Number: [id number] Date of Birth/Sex: July 22, 1934 (82 y.o. Male) Treating RN: Cornell Barman Primary Care Kelsey Edman: Alanson Aly Other Clinician: Referring Elyna Pangilinan: Albertine Patricia Treating Jerelle Virden/Extender: Melburn Hake, HOYT Weeks in Treatment: 0 Vital Signs Time Taken: 13:10 Temperature (F): 97.5 Height (in): 67 Pulse (bpm): 72 Weight (lbs): 190 Respiratory Rate (breaths/min): 16 Body Mass Index (BMI): 29.8 Blood Pressure (mmHg): 104/77 Reference Range: 80 - 120 mg / dl Electronic Signature(s) Signed: 11/29/2016 5:19:54 PM By: Gretta Cool, BSN, RN, CWS, Kim RN, BSN Entered By: Gretta Cool, BSN, RN, CWS, Kim on 11/29/2016 13:10:58

## 2016-11-30 NOTE — Progress Notes (Signed)
Jesse, [name] ([id number]) Visit Report for 11/29/2016 Abuse/Suicide Risk Screen Details Patient Name: [name], [name]. Date of Service: 11/29/2016 12:45 PM Medical Record Number: [id number] Patient Account Number: [id number] Date of Birth/Sex: [birth year] (82 y.o. Male) Treating RN: Cornell Barman Primary Care Tykel Badie: Alanson Aly Other Clinician: Referring Willamina Grieshop: Albertine Patricia Treating Tashima Scarpulla/Extender: Melburn Hake, HOYT Weeks in Treatment: 0 Abuse/Suicide Risk Screen Items Answer ABUSE/SUICIDE RISK SCREEN: Has anyone close to you tried to hurt or harm you recentlyo No Do you feel uncomfortable with anyone in your familyo No Has anyone forced you do things that you didnot want to doo No Do you have any thoughts of harming yourselfo No Patient displays signs or symptoms of abuse and/or neglect. No Electronic Signature(s) Signed: 11/29/2016 5:19:54 PM By: Gretta Cool, BSN, RN, CWS, Kim RN, BSN Entered By: Gretta Cool, BSN, RN, CWS, Kim on 11/29/2016 13:20:54 [name] ([id number]) -------------------------------------------------------------------------------- Activities of Daily Living Details Patient Name: [name]. Date of Service: 11/29/2016 12:45 PM Medical Record Number: [id number] Patient Account Number: [id number] Date of Birth/Sex: [birth year] (82 y.o. Male) Treating RN: Cornell Barman Primary Care Galdino Hinchman: Alanson Aly Other Clinician: Referring Andreika Vandagriff: Albertine Patricia Treating Audre Cenci/Extender: Melburn Hake, HOYT Weeks in Treatment: 0 Activities of Daily Living Items Answer Activities of Daily Living (Please select one for each item) Drive Automobile Need Assistance Take Medications Completely Able Use Telephone Completely Able Care for Appearance Completely Able Use Toilet Completely Able Bath / Shower Completely Able Dress Self Completely Able Feed Self Completely Able Walk Completely Able Get In / Out Bed Completely Able Housework Completely  Able Prepare Meals Completely Able Handle Money Completely Able Shop for Self Completely Able Electronic Signature(s) Signed: 11/29/2016 5:19:54 PM By: Gretta Cool, BSN, RN, CWS, Kim RN, BSN Entered By: Gretta Cool, BSN, RN, CWS, Kim on 11/29/2016 13:21:08 [name] ([id number]) -------------------------------------------------------------------------------- Education Assessment Details Patient Name: [name]. Date of Service: 11/29/2016 12:45 PM Medical Record Number: [id number] Patient Account Number: [id number] Date of Birth/Sex: [birth year] (82 y.o. Male) Treating RN: Cornell Barman Primary Care Monicia Tse: Alanson Aly Other Clinician: Referring Vernica Wachtel: Albertine Patricia Treating Latysha Thackston/Extender: Worthy Keeler Weeks in Treatment: 0 Primary Learner Assessed: Patient Learning Preferences/Education Level/Primary Language Learning Preference: Explanation Highest Education Level: High School Preferred Language: English Cognitive Barrier Assessment/Beliefs Language Barrier: No Translator Needed: No Memory Deficit: No Emotional Barrier: No Cultural/Religious Beliefs Affecting Medical No Care: Physical Barrier Assessment Impaired Vision: No Impaired Hearing: No Decreased Hand dexterity: No Knowledge/Comprehension Assessment Knowledge Level: Medium Comprehension Level: Medium Ability to understand written Medium instructions: Ability to understand verbal Medium instructions: Motivation Assessment Anxiety Level: Calm Cooperation: Cooperative Education Importance: Acknowledges Need Interest in Health Problems: Asks Questions Perception: Coherent Willingness to Engage in Self- High Management Activities: Readiness to Engage in Self- High Management Activities: Electronic Signature(s) JOHNTHOMAS, LADER ([id number]) Signed: 11/29/2016 5:19:54 PM By: Gretta Cool, BSN, RN, CWS, Kim RN, BSN Entered By: Gretta Cool, BSN, RN, CWS, Kim on 11/29/2016 13:21:37 [name]  ([id number]) -------------------------------------------------------------------------------- Fall Risk Assessment Details Patient Name: [name]. Date of Service: 11/29/2016 12:45 PM Medical Record Number: [id number] Patient Account Number: [id number] Date of Birth/Sex: December 20, 1934 (82 y.o. Male) Treating RN: Cornell Barman Primary Care Julius Matus: Alanson Aly Other Clinician: Referring Kayvan Hoefling: Albertine Patricia Treating Hanne Kegg/Extender: Melburn Hake, HOYT Weeks in Treatment: 0 Fall Risk Assessment Items Have you had 2 or more falls in the last 12 monthso 0 Yes Have you had any fall that resulted in injury in the last 12 monthso 0 No FALL RISK  ASSESSMENT: History of falling - immediate or within 3 months 0 No Secondary diagnosis 0 No Ambulatory aid None/bed rest/wheelchair/nurse 0 Yes Crutches/cane/walker 0 No Furniture 0 No IV Access/Saline Lock 0 No Gait/Training Normal/bed rest/immobile 0 Yes Weak 10 Yes Impaired 0 No Mental Status Oriented to own ability 0 Yes Electronic Signature(s) Signed: 11/29/2016 5:19:54 PM By: Gretta Cool, BSN, RN, CWS, Kim RN, BSN Entered By: Gretta Cool, BSN, RN, CWS, Kim on 11/29/2016 13:22:16 [name] ([id number]) -------------------------------------------------------------------------------- Foot Assessment Details Patient Name: [name]. Date of Service: 11/29/2016 12:45 PM Medical Record Number: [id number] Patient Account Number: [id number] Date of Birth/Sex: 09/24/1934 (82 y.o. Male) Treating RN: Cornell Barman Primary Care Trennon Torbeck: Alanson Aly Other Clinician: Referring Lauralyn Shadowens: Albertine Patricia Treating Valton Schwartz/Extender: Melburn Hake, HOYT Weeks in Treatment: 0 Foot Assessment Items Site Locations + = Sensation present, - = Sensation absent, C = Callus, U = Ulcer R = Redness, W = Warmth, M = Maceration, PU = Pre-ulcerative lesion F = Fissure, S = Swelling, D = Dryness Assessment Right: Left: Other Deformity: No  No Prior Foot Ulcer: No No Prior Amputation: No No Charcot Joint: No No Ambulatory Status: Ambulatory With Help Assistance Device: Walker Gait: Unsteady Notes Patient currently seeing PT for rehab. Toes 3-5 amputated with dehiscence, non weight bearing at this time. Electronic Signature(s) Signed: 11/29/2016 5:19:54 PM By: Gretta Cool, BSN, RN, CWS, Kim RN, BSN Entered By: Gretta Cool, BSN, RN, CWS, Kim on 11/29/2016 13:48:42 [name] ([id number]) KAEDAN, RICHERT ([id number]) -------------------------------------------------------------------------------- Nutrition Risk Assessment Details Patient Name: [name]. Date of Service: 11/29/2016 12:45 PM Medical Record Number: [id number] Patient Account Number: [id number] Date of Birth/Sex: [birth year] (82 y.o. Male) Treating RN: Cornell Barman Primary Care Banessa Mao: Alanson Aly Other Clinician: Referring Rishita Petron: Albertine Patricia Treating Nazli Penn/Extender: Melburn Hake, HOYT Weeks in Treatment: 0 Height (in): 67 Weight (lbs): 190 Body Mass Index (BMI): 29.8 Nutrition Risk Assessment Items NUTRITION RISK SCREEN: I have an illness or condition that made me change the kind and/or 0 No amount of food I eat I eat fewer than two meals per day 0 No I eat few fruits and vegetables, or milk products 0 No I have three or more drinks of beer, liquor or wine almost every day 0 No I have tooth or mouth problems that make it hard for me to eat 0 No I don't always have enough money to buy the food I need 0 No I eat alone most of the time 0 No I take three or more different prescribed or over-the-counter drugs [name] 0 No day Without wanting to, I have lost or gained 10 pounds in the last six 0 No months I am not always physically able to shop, cook and/or feed myself 0 No Nutrition Protocols Good Risk Protocol 0 No interventions needed Moderate Risk Protocol Electronic Signature(s) Signed: 11/29/2016 5:19:54 PM By: Gretta Cool, BSN, RN, CWS, Kim  RN, BSN Entered By: Gretta Cool, BSN, RN, CWS, Kim on 11/29/2016 13:22:30

## 2016-12-09 ENCOUNTER — Ambulatory Visit: Payer: Medicare Other | Admitting: Anesthesiology

## 2016-12-09 ENCOUNTER — Encounter
Admission: RE | Disposition: A | Discharge status: Home / Self Care | Payer: Self-pay | Source: Ambulatory Visit | Attending: Podiatry

## 2016-12-09 ENCOUNTER — Ambulatory Visit
Admission: RE | Admit: 2016-12-09 | Discharge: 2016-12-09 | Disposition: A | Discharge status: Home / Self Care | Payer: Medicare Other | Source: Ambulatory Visit | Attending: Podiatry | Admitting: Podiatry

## 2016-12-09 ENCOUNTER — Encounter: Payer: Self-pay | Admitting: Anesthesiology

## 2016-12-09 ENCOUNTER — Encounter
Admission: RE | Admit: 2016-12-09 | Discharge: 2016-12-09 | Disposition: A | Discharge status: Home / Self Care | Payer: Medicare Other | Source: Ambulatory Visit | Attending: Podiatry | Admitting: Podiatry

## 2016-12-09 DIAGNOSIS — E1122 Type 2 diabetes mellitus with diabetic chronic kidney disease: Secondary | ICD-10-CM | POA: Diagnosis not present

## 2016-12-09 DIAGNOSIS — G4733 Obstructive sleep apnea (adult) (pediatric): Secondary | ICD-10-CM | POA: Diagnosis not present

## 2016-12-09 DIAGNOSIS — Z794 Long term (current) use of insulin: Secondary | ICD-10-CM | POA: Insufficient documentation

## 2016-12-09 DIAGNOSIS — I509 Heart failure, unspecified: Secondary | ICD-10-CM | POA: Insufficient documentation

## 2016-12-09 DIAGNOSIS — I130 Hypertensive heart and chronic kidney disease with heart failure and stage 1 through stage 4 chronic kidney disease, or unspecified chronic kidney disease: Secondary | ICD-10-CM | POA: Insufficient documentation

## 2016-12-09 DIAGNOSIS — Z7982 Long term (current) use of aspirin: Secondary | ICD-10-CM | POA: Diagnosis not present

## 2016-12-09 DIAGNOSIS — M1990 Unspecified osteoarthritis, unspecified site: Secondary | ICD-10-CM | POA: Diagnosis not present

## 2016-12-09 DIAGNOSIS — E11621 Type 2 diabetes mellitus with foot ulcer: Secondary | ICD-10-CM | POA: Diagnosis not present

## 2016-12-09 DIAGNOSIS — E782 Mixed hyperlipidemia: Secondary | ICD-10-CM | POA: Insufficient documentation

## 2016-12-09 DIAGNOSIS — N189 Chronic kidney disease, unspecified: Secondary | ICD-10-CM | POA: Diagnosis not present

## 2016-12-09 DIAGNOSIS — I4891 Unspecified atrial fibrillation: Secondary | ICD-10-CM | POA: Insufficient documentation

## 2016-12-09 DIAGNOSIS — Z8546 Personal history of malignant neoplasm of prostate: Secondary | ICD-10-CM | POA: Diagnosis not present

## 2016-12-09 DIAGNOSIS — E1152 Type 2 diabetes mellitus with diabetic peripheral angiopathy with gangrene: Secondary | ICD-10-CM | POA: Diagnosis not present

## 2016-12-09 DIAGNOSIS — L97523 Non-pressure chronic ulcer of other part of left foot with necrosis of muscle: Secondary | ICD-10-CM | POA: Insufficient documentation

## 2016-12-09 DIAGNOSIS — E1142 Type 2 diabetes mellitus with diabetic polyneuropathy: Secondary | ICD-10-CM | POA: Insufficient documentation

## 2016-12-09 DIAGNOSIS — Z79899 Other long term (current) drug therapy: Secondary | ICD-10-CM | POA: Diagnosis not present

## 2016-12-09 DIAGNOSIS — I96 Gangrene, not elsewhere classified: Secondary | ICD-10-CM | POA: Diagnosis present

## 2016-12-09 DIAGNOSIS — Z7901 Long term (current) use of anticoagulants: Secondary | ICD-10-CM | POA: Diagnosis not present

## 2016-12-09 HISTORY — PX: IRRIGATION AND DEBRIDEMENT FOOT: SHX6602

## 2016-12-09 LAB — GLUCOSE, CAPILLARY: GLUCOSE-CAPILLARY: 186 mg/dL — AB (ref 65–99)

## 2016-12-09 LAB — POCT I-STAT 4, (NA,K, GLUC, HGB,HCT)
Glucose, Bld: 195 mg/dL — ABNORMAL HIGH (ref 65–99)
HCT: 46 % (ref 39.0–52.0)
HEMOGLOBIN: 15.6 g/dL (ref 13.0–17.0)
POTASSIUM: 3.8 mmol/L (ref 3.5–5.1)
SODIUM: 137 mmol/L (ref 135–145)

## 2016-12-09 SURGERY — IRRIGATION AND DEBRIDEMENT FOOT
Anesthesia: General | Laterality: Left | Wound class: Dirty or Infected

## 2016-12-09 MED ORDER — OXYCODONE-ACETAMINOPHEN 7.5-325 MG PO TABS
1.0000 | ORAL_TABLET | ORAL | Status: DC | PRN
Start: 2016-12-09 — End: 2016-12-09
  Administered 2016-12-09: 1 via ORAL

## 2016-12-09 MED ORDER — PHENYLEPHRINE HCL 10 MG/ML IJ SOLN
INTRAMUSCULAR | Status: DC | PRN
  Administered 2016-12-09: 100 ug via INTRAVENOUS
  Administered 2016-12-09: 400 ug via INTRAVENOUS
  Administered 2016-12-09: 200 ug via INTRAVENOUS

## 2016-12-09 MED ORDER — FENTANYL CITRATE (PF) 100 MCG/2ML IJ SOLN
25.0000 ug | INTRAMUSCULAR | Status: DC | PRN
  Administered 2016-12-09 (×4): 25 ug via INTRAVENOUS

## 2016-12-09 MED ORDER — METOPROLOL TARTRATE 5 MG/5ML IV SOLN
INTRAVENOUS | Status: AC
  Filled 2016-12-09: qty 5

## 2016-12-09 MED ORDER — OXYCODONE-ACETAMINOPHEN 7.5-325 MG PO TABS: 1.0000 | ORAL_TABLET | ORAL | 0 refills | Status: DC | PRN

## 2016-12-09 MED ORDER — FENTANYL CITRATE (PF) 100 MCG/2ML IJ SOLN
INTRAMUSCULAR | Status: DC | PRN
  Administered 2016-12-09: 25 ug via INTRAVENOUS

## 2016-12-09 MED ORDER — SODIUM CHLORIDE 0.9 % IV SOLN
INTRAVENOUS | Status: DC
  Administered 2016-12-09: 11:00:00 via INTRAVENOUS

## 2016-12-09 MED ORDER — ONDANSETRON HCL 4 MG/2ML IJ SOLN
INTRAMUSCULAR | Status: DC | PRN
  Administered 2016-12-09: 4 mg via INTRAVENOUS

## 2016-12-09 MED ORDER — ONDANSETRON HCL 4 MG/2ML IJ SOLN: 4.0000 mg | Freq: Once | INTRAMUSCULAR | Status: DC | PRN

## 2016-12-09 MED ORDER — DEXAMETHASONE SODIUM PHOSPHATE 10 MG/ML IJ SOLN
INTRAMUSCULAR | Status: DC | PRN
  Administered 2016-12-09: 5 mg via INTRAVENOUS

## 2016-12-09 MED ORDER — METOPROLOL TARTRATE 5 MG/5ML IV SOLN
5.0000 mg | Freq: Once | INTRAVENOUS | Status: AC
  Administered 2016-12-09: 5 mg via INTRAVENOUS

## 2016-12-09 MED ORDER — LIDOCAINE HCL (PF) 2 % IJ SOLN
INTRAMUSCULAR | Status: AC
  Filled 2016-12-09: qty 2

## 2016-12-09 MED ORDER — VASOPRESSIN 20 UNIT/ML IV SOLN
INTRAVENOUS | Status: DC | PRN
  Administered 2016-12-09 (×2): 1 [IU] via INTRAVENOUS

## 2016-12-09 MED ORDER — LIDOCAINE HCL (CARDIAC) 20 MG/ML IV SOLN
INTRAVENOUS | Status: DC | PRN
  Administered 2016-12-09: 60 mg via INTRAVENOUS

## 2016-12-09 MED ORDER — PROPOFOL 500 MG/50ML IV EMUL
INTRAVENOUS | Status: AC
  Filled 2016-12-09: qty 50

## 2016-12-09 MED ORDER — ONDANSETRON HCL 4 MG/2ML IJ SOLN
INTRAMUSCULAR | Status: AC
  Filled 2016-12-09: qty 2

## 2016-12-09 MED ORDER — FENTANYL CITRATE (PF) 100 MCG/2ML IJ SOLN
INTRAMUSCULAR | Status: AC
  Filled 2016-12-09: qty 2

## 2016-12-09 MED ORDER — FAMOTIDINE 20 MG PO TABS
ORAL_TABLET | ORAL | Status: AC
  Administered 2016-12-09: 20 mg via ORAL
  Filled 2016-12-09: qty 1

## 2016-12-09 MED ORDER — LIDOCAINE HCL 1 % IJ SOLN
INTRAMUSCULAR | Status: DC | PRN
  Administered 2016-12-09: 4 mL

## 2016-12-09 MED ORDER — FAMOTIDINE 20 MG PO TABS
20.0000 mg | ORAL_TABLET | Freq: Once | ORAL | Status: AC
  Administered 2016-12-09: 20 mg via ORAL

## 2016-12-09 MED ORDER — BUPIVACAINE HCL (PF) 0.5 % IJ SOLN
INTRAMUSCULAR | Status: DC | PRN
  Administered 2016-12-09: 4 mL

## 2016-12-09 MED ORDER — VASOPRESSIN 20 UNIT/ML IV SOLN
INTRAVENOUS | Status: AC
  Filled 2016-12-09: qty 1

## 2016-12-09 MED ORDER — PROPOFOL 10 MG/ML IV BOLUS
INTRAVENOUS | Status: AC
  Filled 2016-12-09: qty 20

## 2016-12-09 MED ORDER — DEXTROSE 5 % IV SOLN
1.5000 g | Freq: Once | INTRAVENOUS | Status: DC
  Filled 2016-12-09: qty 15

## 2016-12-09 MED ORDER — PROPOFOL 10 MG/ML IV BOLUS
INTRAVENOUS | Status: DC | PRN
  Administered 2016-12-09: 120 mg via INTRAVENOUS

## 2016-12-09 MED ORDER — OXYCODONE-ACETAMINOPHEN 7.5-325 MG PO TABS
ORAL_TABLET | ORAL | Status: AC
  Filled 2016-12-09: qty 1

## 2016-12-09 SURGICAL SUPPLY — 40 items
BAG COUNTER SPONGE EZ (MISCELLANEOUS) IMPLANT
BANDAGE ACE 4X5 VEL STRL LF (GAUZE/BANDAGES/DRESSINGS) ×2 IMPLANT
BANDAGE ELASTIC 3 LF NS (GAUZE/BANDAGES/DRESSINGS) ×2 IMPLANT
BANDAGE ELASTIC 4 LF NS (GAUZE/BANDAGES/DRESSINGS) ×2 IMPLANT
BANDAGE STRETCH 3X4.1 STRL (GAUZE/BANDAGES/DRESSINGS) ×2 IMPLANT
BLADE MED AGGRESSIVE (BLADE) ×2 IMPLANT
BNDG ESMARK 4X12 TAN STRL LF (GAUZE/BANDAGES/DRESSINGS) ×2 IMPLANT
BNDG GAUZE 4.5X4.1 6PLY STRL (MISCELLANEOUS) ×2 IMPLANT
CANISTER SUCT 1200ML W/VALVE (MISCELLANEOUS) ×2 IMPLANT
CUFF TOURN 18 STER (MISCELLANEOUS) IMPLANT
CUFF TOURN DUAL PL 12 NO SLV (MISCELLANEOUS) IMPLANT
DRAPE FLUOR MINI C-ARM 54X84 (DRAPES) IMPLANT
DURAPREP 26ML APPLICATOR (WOUND CARE) ×2 IMPLANT
ELECT REM PT RETURN 9FT ADLT (ELECTROSURGICAL) ×2
ELECTRODE REM PT RTRN 9FT ADLT (ELECTROSURGICAL) ×1 IMPLANT
GAUZE PETRO XEROFOAM 1X8 (MISCELLANEOUS) ×2 IMPLANT
GAUZE SPONGE 4X4 12PLY STRL (GAUZE/BANDAGES/DRESSINGS) ×2 IMPLANT
GLOVE BIO SURGEON STRL SZ8 (GLOVE) ×2 IMPLANT
GLOVE INDICATOR 8.0 STRL GRN (GLOVE) ×2 IMPLANT
GOWN STRL REUS W/ TWL LRG LVL3 (GOWN DISPOSABLE) ×1 IMPLANT
GOWN STRL REUS W/TWL LRG LVL3 (GOWN DISPOSABLE) ×1
HANDPIECE VERSAJET DEBRIDEMENT (MISCELLANEOUS) ×2 IMPLANT
KIT RM TURNOVER STRD PROC AR (KITS) ×2 IMPLANT
LABEL OR SOLS (LABEL) IMPLANT
NEEDLE FILTER BLUNT 18X 1/2SAF (NEEDLE) ×1
NEEDLE FILTER BLUNT 18X1 1/2 (NEEDLE) ×1 IMPLANT
NEEDLE HYPO 25X1 1.5 SAFETY (NEEDLE) ×4 IMPLANT
NS IRRIG 500ML POUR BTL (IV SOLUTION) ×2 IMPLANT
PACK EXTREMITY ARMC (MISCELLANEOUS) ×2 IMPLANT
STOCKINETTE STRL 6IN 960660 (GAUZE/BANDAGES/DRESSINGS) ×2 IMPLANT
SUT ETHILON 3-0 FS-10 30 BLK (SUTURE) ×2
SUT ETHILON 4-0 (SUTURE) ×1
SUT ETHILON 4-0 FS2 18XMFL BLK (SUTURE) ×1
SUT VIC AB 3-0 SH 27 (SUTURE) ×1
SUT VIC AB 3-0 SH 27X BRD (SUTURE) ×1 IMPLANT
SUT VIC AB 4-0 FS2 27 (SUTURE) ×2 IMPLANT
SUTURE EHLN 3-0 FS-10 30 BLK (SUTURE) ×1 IMPLANT
SUTURE ETHLN 4-0 FS2 18XMF BLK (SUTURE) ×1 IMPLANT
SYR 3ML LL SCALE MARK (SYRINGE) ×4 IMPLANT
SYRINGE 10CC LL (SYRINGE) ×2 IMPLANT

## 2016-12-09 NOTE — Transfer of Care (Signed)
Immediate Anesthesia Transfer of Care Note  Patient: [name]  Procedure(s) Performed: Procedure(s): IRRIGATION AND DEBRIDEMENT FOOT-MUSCLE/FASCIA, RESECTION OF FOURTH AND FIFTH METATARSAL NECROTIC BONE (Left)  Patient Location: PACU  Anesthesia Type:General  Level of Consciousness: awake and patient cooperative  Airway & Oxygen Therapy: Patient Spontanous Breathing and Patient connected to face mask oxygen  Post-op Assessment: Report given to RN and Post -op Vital signs reviewed and stable  Post vital signs: Reviewed and stable  Last Vitals:  Vitals:   12/09/16 1053  BP: 135/76  Pulse: (!) 53  Resp: 20  Temp: 36.6 C    Last Pain:  Vitals:   12/09/16 1053  TempSrc: Oral  PainSc: 6          Complications: No apparent anesthesia complications

## 2016-12-09 NOTE — Anesthesia Post-op Follow-up Note (Cosign Needed)
Anesthesia QCDR form completed.        

## 2016-12-09 NOTE — Anesthesia Postprocedure Evaluation (Signed)
Anesthesia Post Note  Patient: [name]  Procedure(s) Performed: Procedure(s) (LRB): IRRIGATION AND DEBRIDEMENT FOOT-MUSCLE/FASCIA, RESECTION OF FOURTH AND FIFTH METATARSAL NECROTIC BONE (Left)  Patient location during evaluation: PACU Anesthesia Type: General Level of consciousness: awake and alert Pain management: pain level controlled Vital Signs Assessment: post-procedure vital signs reviewed and stable Respiratory status: spontaneous breathing, nonlabored ventilation, respiratory function stable and patient connected to nasal cannula oxygen Cardiovascular status: blood pressure returned to baseline and stable Postop Assessment: no signs of nausea or vomiting Anesthetic complications: no Comments: In PreOp the patients pulse was read in the 50's but his EKG heart rate was in the 120s-130s. Decision was made to continue with this case due to the urgent nature of the procedure. Speaking with the patient and reviewing his past medical records this rate seems to be his baseline heart rate so I felt that it was safe to continue with the case as well. Patient was given 10 mg of esmolol IV in the PACU for rate control. At time of PACU discharge the patients pulse and EKG heart rate was was 101 bpm.      Last Vitals:  Vitals:   12/09/16 1500 12/09/16 1506  BP:    Pulse: (!) 52 (!) 116  Resp: 19 11  Temp:  36.6 C    Last Pain:  Vitals:   12/09/16 1506  TempSrc:   PainSc: 2                  Precious Haws Piscitello

## 2016-12-09 NOTE — H&P (Signed)
H and P has been reviewed and no changes are noted.  

## 2016-12-09 NOTE — Anesthesia Preprocedure Evaluation (Signed)
Anesthesia Evaluation  Patient identified by MRN, date of birth, ID band Patient awake    Reviewed: Allergy & Precautions, H&P , NPO status , Patient's Chart, lab work & pertinent test results, reviewed documented beta blocker date and time   Airway Mallampati: II  TM Distance: >3 FB Neck ROM: full    Dental  (+) Teeth Intact, Poor Dentition   Pulmonary neg pulmonary ROS, sleep apnea , pneumonia, former smoker,           Cardiovascular Exercise Tolerance: Good hypertension, + Peripheral Vascular Disease and +CHF  negative cardio ROS       Neuro/Psych negative neurological ROS  negative psych ROS   GI/Hepatic negative GI ROS, Neg liver ROS,   Endo/Other  negative endocrine ROSdiabetes  Renal/GU Renal diseasenegative Renal ROS  negative genitourinary   Musculoskeletal  (+) Arthritis ,   Abdominal   Peds  Hematology negative hematology ROS (+)   Anesthesia Other Findings Past Medical History: No date: Arthritis No date: Atrial fibrillation (HCC) No date: Carcinoma of prostate (HCC) No date: CHF (congestive heart failure) (HCC) No date: Chronic kidney disease No date: Diabetes mellitus without complication (HCC) No date: ED (erectile dysfunction) No date: Frequent urination No date: Hyperlipidemia No date: Hypertension No date: Moderate mitral insufficiency No date: Peripheral vascular disease ([place]) 04/2016: Pneumonia No date: Prostate cancer ([place]) No date: Sleep apnea     Comment:  OSA--USE C-PAP No date: Ulcer of left foot due to type 2 diabetes mellitus ([place]) No date: Urinary stress incontinence, male   Reproductive/Obstetrics negative OB ROS                             Anesthesia Physical  Anesthesia Plan  ASA: III  Anesthesia Plan: General   Post-op Pain Management:    Induction: Intravenous  PONV Risk Score and Plan: 2 and Ondansetron and Dexamethasone  Airway  Management Planned: LMA  Additional Equipment:   Intra-op Plan:   Post-operative Plan: Extubation in OR  Informed Consent: I have reviewed the patients History and Physical, chart, labs and discussed the procedure including the risks, benefits and alternatives for the proposed anesthesia with the patient or authorized representative who has indicated his/her understanding and acceptance.     Plan Discussed with: CRNA  Anesthesia Plan Comments: ([place] regarding need for GA vs. Ankle block or other regional options.  JA)        Anesthesia Quick Evaluation

## 2016-12-09 NOTE — Discharge Instructions (Addendum)
[place] [address] [place]   1. Take your medication as prescribed.  Pain medication should be taken only as needed.  2. Keep the dressing clean, dry and intact.The wound VAC to be changed approximately every 3 days since it is close to the weekend probably change it on Monday via your home health agency.  3. Keep your foot elevated only to about waist level never any higher.  4. Walking to the bathroom and brief periods of walking are acceptable, unless we have instructed you to be non-weight bearing.  5. Always wear your post-op shoe when walking.  Always use your crutches if you are to be non-weight bearing. Recommend using a walker to help his balance when walking and wearing his postop shoe.  6. Do not take a shower. Baths are permissible as long as the foot is kept out of the water.   7.   Call [facility] [phone]) if any of the following problems occur: - You develop a temperature or fever. - The bandage becomes saturated with blood. - Medication does not stop your pain. - Injury of the foot occurs. - Any symptoms of infection including redness, odor, or red streaks running from wound  [place]   The drugs that you were given will stay in your system until tomorrow so for the next 24 hours you should not:  Drive an automobile Make any legal decisions Drink any alcoholic beverage   You may resume regular meals tomorrow.  Today it is better to start with liquids and gradually work up to solid foods.  You may eat anything you prefer, but it is better to start with liquids, then soup and crackers, and gradually work up to solid foods.   Please notify your doctor immediately if you have any unusual bleeding, trouble breathing, redness and pain at the surgery site, drainage, fever, or pain not relieved by  medication.   Additional Instructions:According to the doctors office, arrangements have already been made with the home health nurses to come see you and change the wound vac.  Take stool softeners along with any narcotic pain medications.   - Please contact your physician with any problems or Same Day Surgery at [phone], Monday through Friday 6 am to 4 pm, or [place] at [facility] number at [phone]. -

## 2016-12-09 NOTE — Anesthesia Procedure Notes (Signed)
Procedure Name: LMA Insertion Date/Time: 12/09/2016 1:01 PM Performed by: Aline Brochure Pre-anesthesia Checklist: Patient identified, Patient being monitored, Timeout performed, Emergency Drugs available and Suction available Patient Re-evaluated:Patient Re-evaluated prior to induction Oxygen Delivery Method: Circle system utilized Preoxygenation: Pre-oxygenation with 100% oxygen Induction Type: IV induction Ventilation: Mask ventilation without difficulty LMA: LMA inserted LMA Size: 4.0 Tube type: Oral Number of attempts: 1 Placement Confirmation: positive ETCO2 and breath sounds checked- equal and bilateral Tube secured with: Tape Dental Injury: Teeth and Oropharynx as per pre-operative assessment

## 2016-12-09 NOTE — Op Note (Signed)
Operative note   Surgeon: Dr. Albertine Patricia, DPM.    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    Postop diagnosis: Same    Procedure:   1. Excisional debridement of infected skin and soft tissue muscle.   2. Resection of fourth and fifth metatarsals from midshaft distally.        EBL: 20 cc    Anesthesia:general    Hemostasis: None was utilized    Specimen: Necrotic bone and cartilage as well as necrotic skin and subcutaneous and tendinous tissues    Complications: None    Operative indications: Chronic nonhealing wound secondary to significant peripheral arterial disease diabetes at previous same dictation site of the third fourth and fifth toes left foot    Procedure:  Patient was brought into the OR and placed on the operating table in thesupine position. After anesthesia was obtained theleft lower extremity was prepped and draped in usual sterile fashion.  Operative Report: At this time attention was directed to the left foot there is an open wound with necrosis of some of the superficial tissues including skin and subcutaneous tissues approximately 2.8 cm in length and 3.5 cm in width with approximately 2 cm depth. A combination of sharp excision with a 15 blade as well as excision with a versa jet was used to accomplish removal of all the necrotic and tissue. No overt infected tissue was noted. Some minimal bleeding occurred in the region and overall once the versa jet was finished soft tissue looked healthy. The fourth fifth metatarsal exam this time and seem to be discolored and grayish with variable flow to the regions and this bone being exposed for a while. This point I elected to resect the fourth fifth metatarsal heads and remove these areas. Further versa jet debridement was utilized to remove any devitalized tissue from the deeper  regions. Once this accomplished there was copiously irrigated with normal saline and the proximal portion incision was closed with 3-0 nylon simple interrupted sutures. The distal portion incision was closed also similar fashion. A 1.8 cm opening was left in the wound and packed with a wound VAC sponge and wound VAC was prepared and placed onto the open wound along the incision margin. Turned wound VAC 225 mm continuous and it seemed to have an excellent seal and was functioning. Further dressings placed across the area for padding and protection including Kerlix and 4 x 4's.    Patient tolerated the procedure and anesthesia well.  Was transported from the OR to the PACU with all vital signs stable and vascular status intact. To be discharged per routine protocol.  Will follow up in approximately 1 week in the outpatient clinic.

## 2016-12-10 ENCOUNTER — Encounter: Payer: Self-pay | Admitting: Podiatry

## 2016-12-13 ENCOUNTER — Encounter: Payer: Self-pay | Admitting: Podiatry

## 2016-12-13 LAB — SURGICAL PATHOLOGY

## 2016-12-16 ENCOUNTER — Ambulatory Visit (INDEPENDENT_AMBULATORY_CARE_PROVIDER_SITE_OTHER): Payer: Medicare Other | Admitting: Vascular Surgery

## 2016-12-17 ENCOUNTER — Other Ambulatory Visit (INDEPENDENT_AMBULATORY_CARE_PROVIDER_SITE_OTHER): Payer: Self-pay

## 2016-12-17 ENCOUNTER — Ambulatory Visit (INDEPENDENT_AMBULATORY_CARE_PROVIDER_SITE_OTHER): Payer: Medicare Other | Admitting: Vascular Surgery

## 2016-12-17 ENCOUNTER — Encounter (INDEPENDENT_AMBULATORY_CARE_PROVIDER_SITE_OTHER): Payer: Self-pay

## 2016-12-17 DIAGNOSIS — I10 Essential (primary) hypertension: Secondary | ICD-10-CM | POA: Diagnosis not present

## 2016-12-17 DIAGNOSIS — I7025 Atherosclerosis of native arteries of other extremities with ulceration: Secondary | ICD-10-CM

## 2016-12-17 DIAGNOSIS — I4891 Unspecified atrial fibrillation: Secondary | ICD-10-CM | POA: Diagnosis not present

## 2016-12-17 DIAGNOSIS — E785 Hyperlipidemia, unspecified: Secondary | ICD-10-CM

## 2016-12-17 DIAGNOSIS — Z794 Long term (current) use of insulin: Secondary | ICD-10-CM

## 2016-12-17 DIAGNOSIS — E119 Type 2 diabetes mellitus without complications: Secondary | ICD-10-CM | POA: Diagnosis not present

## 2016-12-17 NOTE — Assessment & Plan Note (Signed)
This remains a critical limb threatening situation. We discussed options today. With this persistent unrelenting pain, a below-knee amputation would certainly be a reasonable option. Another option would be a repeat angiogram to see if there is any perfusion abnormalities that can be improved, and then either performing a wider debridement or a transmetatarsal amputation by Dr. Elvina Mattes. I think either option is reasonable. He really is not in a state of mind to be able to make any definitive decision today and certainly not one is definitive as a below-knee amputation. We are going to proceed with an angiogram next week after discussions with his wife. Pending the findings on this angiogram further surgical therapy will be planned.

## 2016-12-17 NOTE — Patient Instructions (Signed)
Angiogram  An angiogram is an X-ray test. It is used to look at your blood vessels. For this test, a dye is put into the blood vessel being checked. The dye shows up on X-rays. It helps your doctor see if there is a blockage or other problem in the blood vessel.  What happens before the procedure?   Follow your doctor's instructions about limiting what you eat or drink.   Ask your doctor if you may drink enough water to take any needed medicines the morning of the test.   Plan to have someone take you home after the test.   If you go home the same day as the test, plan to have someone stay with you for 24 hours.  What happens during the procedure?   An IV tube will be put into one of your veins.   You will be given a medicine that makes you relax (sedative).   Your skin will be washed where the thin tube (catheter) will be put in. Hair may be removed from this area. The tube may be put into:  ? Your upper leg area (groin).  ? The fold of your arm, near your elbow.  ? Your wrist.   You will be given a medicine that numbs the area where the tube will be inserted (local anesthetic).   The tube will be inserted into a blood vessel.   Using a type of X-ray (fluoroscopy) to see, your doctor will move the tube into the blood vessel to check it.   Dye will be put in through the tube. X-rays of your blood vessels will then be taken.  Different doctors and hospitals may do this procedure differently.  What happens after the procedure?   If the test is done through the leg, you will be kept in bed lying flat for several hours. You will be told to not bend or cross your legs.   The area where the tube was inserted will be checked often.   The pulse in your feet or wrist will be checked often.   More tests or X-rays may be done.  This information is not intended to replace advice given to you by your health care provider. Make sure you discuss any questions you have with your health care provider.  Document  Released: 08/06/2008 Document Revised: 10/16/2015 Document Reviewed: 10/11/2012  [employer]vier Interactive Patient Education  2017 [employer]

## 2016-12-17 NOTE — Progress Notes (Signed)
MRN : [id number]  [name] is a 82 y.o. (05-Apr-1935) male who presents with chief complaint of  Chief Complaint  Patient presents with  . non healing wound  .  History of Present Illness: Patient returns today in follow up of PAD. He is not particularly clearheaded today and continues to repeat "what are we going to do about the pain". His wife says that the pain medicines mass with his mind but in all honesty he appears to have significant dementia on every encounter I have had with him. His wound is not really progressing and I talked with his podiatrist today about this. Review is revascularization but his perfusion has not been checked in several months now. His pain really is constant and he complains about this around-the-clock. He does not have fever or chills. He has no right leg symptoms.  Current Outpatient Prescriptions  Medication Sig Dispense Refill  . aspirin EC 81 MG tablet Take 1 tablet (81 mg total) by mouth daily. 150 tablet 2  . atorvastatin (LIPITOR) 80 MG tablet Take 80 mg by mouth daily at 6 PM.     . diltiazem (CARDIZEM CD) 300 MG 24 hr capsule Take 300 mg by mouth daily.    . furosemide (LASIX) 20 MG tablet TAKE 1 TABLET DAILY    . gabapentin (NEURONTIN) 100 MG capsule Take 100 mg by mouth daily as needed (nerve pain).     . insulin glargine (LANTUS) 100 UNIT/ML injection Inject 0.24 mLs (24 Units total) into the skin at bedtime. 10 mL 11  . JARDIANCE 10 MG TABS tablet Take 10 mg by mouth daily.    . metFORMIN (GLUCOPHAGE) 1000 MG tablet TAKE 1 TABLET TWICE A DAY WITH MEALS    . metoprolol (LOPRESSOR) 100 MG tablet Take 1 tablet (100 mg total) by mouth 2 (two) times daily. 60 tablet 3  . oxyCODONE-acetaminophen (PERCOCET) 7.5-325 MG tablet Take 1 tablet by mouth every 4 (four) hours as needed for severe pain. 30 tablet 0  . polyethylene glycol (MIRALAX / GLYCOLAX) packet Take 17 g by mouth daily as needed for mild constipation. 14 each 0  . TOPROL XL 100 MG  24 hr tablet Take 100 mg by mouth daily.    . traMADol (ULTRAM) 50 MG tablet Take 1 tablet (50 mg total) by mouth every 6 (six) hours as needed for moderate pain. 30 tablet 0  . TRULICITY 4.65 KC/1.2XN SOPN 0.75 mg once a week.    . warfarin (COUMADIN) 5 MG tablet Take 5 mg by mouth every evening.      No current facility-administered medications for this visit.     Past Medical History:  Diagnosis Date  . Arthritis   . Atrial fibrillation ([place])   . Carcinoma of prostate ([place])   . CHF (congestive heart failure) ([place])   . Chronic kidney disease   . Diabetes mellitus without complication ([place])   . ED (erectile dysfunction)   . Frequent urination   . Hyperlipidemia   . Hypertension   . Moderate mitral insufficiency   . Peripheral vascular disease ([place])   . Pneumonia 04/2016  . Prostate cancer ([place])   . Sleep apnea    OSA--USE C-PAP  . Ulcer of left foot due to type 2 diabetes mellitus ([place])   . Urinary stress incontinence, male     Past Surgical History:  Procedure Laterality Date  . APLIGRAFT PLACEMENT Left 10/15/2016   Procedure: APLIGRAFT PLACEMENT;  Surgeon: Albertine Patricia, DPM;  Location: [facility];  Service: Podiatry;  Laterality: Left;  necrotic ulcer  . CHOLECYSTECTOMY    . EYE SURGERY Bilateral    Cataract Extraction with IOL  . IRRIGATION AND DEBRIDEMENT FOOT Left 10/15/2016   Procedure: IRRIGATION AND DEBRIDEMENT FOOT-EXCISIONAL DEBRIDEMENT OF SKIN 3RD, 4TH AND 5TH TOES WITH APPLICATION OF APLIGRAFT;  Surgeon: Albertine Patricia, DPM;  Location: [facility];  Service: Podiatry;  Laterality: Left;  necrotic,gangrene  . IRRIGATION AND DEBRIDEMENT FOOT Left 12/09/2016   Procedure: IRRIGATION AND DEBRIDEMENT FOOT-MUSCLE/FASCIA, RESECTION OF FOURTH AND FIFTH METATARSAL NECROTIC BONE;  Surgeon: Albertine Patricia, DPM;  Location: [facility];  Service: Podiatry;  Laterality: Left;  . LOWER EXTREMITY ANGIOGRAPHY Left 08/16/2016   Procedure: Lower Extremity Angiography;  Surgeon: Algernon Huxley, MD;  Location: [facility];  Service: Cardiovascular;  Laterality: Left;  . LOWER EXTREMITY ANGIOGRAPHY Left 09/01/2016   Procedure: Lower Extremity Angiography;  Surgeon: Algernon Huxley, MD;  Location: [facility];  Service: Cardiovascular;  Laterality: Left;  . LOWER EXTREMITY ANGIOGRAPHY Left 10/14/2016   Procedure: Lower Extremity Angiography;  Surgeon: Algernon Huxley, MD;  Location: [facility];  Service: Cardiovascular;  Laterality: Left;  . LOWER EXTREMITY INTERVENTION  09/01/2016   Procedure: Lower Extremity Intervention;  Surgeon: Algernon Huxley, MD;  Location: [facility];  Service: Cardiovascular;;  . PROSTATE SURGERY     removal  . TONSILLECTOMY     as a child         Family History  Problem Relation Age of Onset  . Prostate cancer Neg Hx   . Bladder Cancer Neg Hx   . Kidney disease Neg Hx   No bleeding disorders, clotting disorders, autoimmune diseases, or aneurysms  Social History      Social History  Substance Use Topics  . Smoking status: Former Smoker    Quit date: 10/14/1994  . Smokeless tobacco: Never Used  . Alcohol use No  Married, wife accompanies today       Allergies  Allergen Reactions  . Contrast Media [Iodinated Diagnostic Agents]     SOB  . Iohexol     Desc: Respiratory Distress, Laryngedema, diaphoresis, Onset Date: [id number]   . Latex Rash       REVIEW OF SYSTEMS(Negative unless checked)  Constitutional: _0 Weight loss_1 Fever_2 Chills Cardiac:_3 Chest pain_4 Chest pressure_5 Palpitations _6 Shortness of breath when laying flat _7 Shortness of breath at rest _8 Shortness of breath with exertion. Vascular: _9 Pain in legs with walking_10 Pain in legsat rest_11 Pain in legs when laying flat _12 Claudication _13 Pain in feet when walking _14 Pain in feet at rest _15 Pain in feet when laying flat _16 History of DVT _17 Phlebitis _18 Swelling in legs _19 Varicose  veins _20 Non-healing ulcers Pulmonary: _21 Uses home oxygen _22 Productive cough_23 Hemoptysis _24 Wheeze _25 COPD _26 Asthma Neurologic: _27 Dizziness _28 Blackouts _29 Seizures _30 History of stroke _31 History of TIA_32 Aphasia _33 Temporary blindness_34 Dysphagia _35 Weaknessor numbness in arms _36 Weakness or numbnessin legs Musculoskeletal: _37 Arthritis _38 Joint swelling _39 Joint pain _40 Low back pain Hematologic:_41 Easy bruising_42 Easy bleeding _43 Hypercoagulable state _44 Anemic _45 Hepatitis Gastrointestinal:_46 Blood in stool_47 Vomiting blood_48 Gastroesophageal reflux/heartburn_49 Abdominal pain Genitourinary: _50 Chronic kidney disease _51 Difficulturination _52 Frequenturination _53 Burning with urination_54 Hematuria Skin: _55 Rashes _56 Ulcers _57 Wounds Psychological: _58 History of anxiety_59 History of major depression.   Physical Examination  There were no vitals taken for this visit. Gen:  WD/WN, somewhat disheveled appearing Head: [place]/AT, No temporalis wasting. Ear/Nose/Throat: Hearing grossly intact, nares w/o erythema or drainage, trachea midline Eyes: Conjunctiva clear. Sclera non-icteric Neck: Supple.  No JVD.  Pulmonary:  Good air movement, no use of accessory muscles.  Cardiac: RRR, normal S1, S2 Vascular:  Vessel Right Left  Radial  Palpable Palpable                          PT 1+ Palpable Trace Palpable  DP 1+ Palpable 1+ Palpable    Musculoskeletal: M/S 5/5 throughout.  No edema. Rubor is present in the left foot with dependency. 2-3 cm draining wound on the lateral aspect of the left foot. Toes 3 through 5 had already been amputated. Mild surrounding erythema. Neurologic: Sensation grossly intact in extremities.  Symmetrical.  Speech is fluent.  Psychiatric: Judgment and insight do not appear to be very good today. Dermatologic: Left foot wound as described above      Labs Recent Results (from the past 2160 hour(s))  Basic  metabolic panel     Status: Abnormal   Collection Time: 10/12/16  3:32 PM  Result Value Ref Range   Sodium 139 135 - 145 mmol/L   Potassium 4.4 3.5 - 5.1 mmol/L   Chloride 104 101 - 111 mmol/L   CO2 26 22 - 32 mmol/L   Glucose, Bld 118 (H) 65 - 99 mg/dL   BUN 30 (H) 6 - 20 mg/dL   Creatinine, Ser 1.10 0.61 - 1.24 mg/dL   Calcium 9.2 8.9 - 10.3 mg/dL   GFR calc non Af Amer >60 >60 mL/min   GFR calc Af Amer >60 >60 mL/min    Comment: (NOTE) The eGFR has been calculated using the CKD EPI equation. This calculation has not been validated in all clinical situations. eGFR's persistently <60 mL/min signify possible Chronic Kidney Disease.    Anion gap 9 5 - 15  BUN     Status: Abnormal   Collection Time: 10/14/16  8:38 AM  Result Value Ref Range   BUN 34 (H) 6 - 20 mg/dL  Creatinine, serum     Status: Abnormal   Collection Time: 10/14/16  8:38 AM  Result Value Ref Range   Creatinine, Ser 1.22 0.61 - 1.24 mg/dL   GFR calc non Af Amer 55 (L) >60 mL/min   GFR calc Af Amer >60 >60 mL/min    Comment: (NOTE) The eGFR has been calculated using the CKD EPI equation. This calculation has not been validated in all clinical situations. eGFR's persistently <60 mL/min signify possible Chronic Kidney Disease.   Protime-INR     Status: Abnormal   Collection Time: 10/14/16  8:38 AM  Result Value Ref Range   Prothrombin Time 32.8 (H) 11.4 - 15.2 seconds   INR 3.12   Glucose, capillary     Status: Abnormal   Collection Time: 10/14/16  1:26 PM  Result Value Ref Range   Glucose-Capillary 242 (H) 65 - 99 mg/dL  Glucose, capillary     Status: Abnormal   Collection Time: 10/15/16  6:08 AM  Result Value Ref Range   Glucose-Capillary 237 (H) 65 - 99 mg/dL  Surgical pathology     Status: None   Collection Time: 10/15/16  8:12 AM  Result Value Ref Range   SURGICAL PATHOLOGY      Surgical Pathology CASE: [id number] PATIENT: Janelle Floor Surgical Pathology Report     SPECIMEN  SUBMITTED: A. Toes, left foot, 3rd, 4th, and 5th  CLINICAL HISTORY: None provided  PRE-OPERATIVE DIAGNOSIS: Skin ulcers of foot  POST-OPERATIVE DIAGNOSIS: None provided.     DIAGNOSIS: A. TOES, LEFT THIRD, FOURTH, AND FIFTH; AMPUTATION: - SKIN AND SOFT TISSUE WITH CHRONIC ULCERATION AND NECROSIS. - UNDERLYING OSTEOMYELITIS INVOLVING FOURTH TOE. - VIABLE TISSUE AT MARGINS.  GROSS DESCRIPTION:  A. Labeled: third, fourth and fifth toes from left foot  Size: 6.3 x 5.7 x 2.1 cm partial forefoot amputation with 3 digits  Description of lesion(s): all 3 digits have ulceration sloughing  Proximal margin: sloughing and discoloration grossly extending to surgical margins on all 3 digits  Bone: bone margin on all 3 digits smooth articular surface  Other findings: margin inked blue (third digit sections marked green, fourth digit sections marked black i n cassettes 1 and 2)  Block summary: 1-perpendicular skin margins 2-en face bones margin following decalcification 3-representative fifth digit skin discoloration and bone following decalcification 4-representative fourth digit skin discoloration and bone following decalcification 5-representative third digit skin discoloration and bone following decalcification  Final Diagnosis performed by Quay Burow, MD.  Electronically signed 10/19/2016 9:32:00AM    The electronic signature indicates that the named Attending Pathologist has evaluated the specimen  Technical component performed at [facility], [address] [place], [place] [address] Lab: [phone] Dir: Darrick Penna. Evette Doffing, MD  Professional component performed at [facility], [facility], [place], [place], [place] [address] Lab: [phone] Dir: Dellia Nims. Rubinas, MD    Glucose, capillary     Status: Abnormal   Collection Time: 10/15/16  9:12 AM  Result Value Ref Range   Glucose-Capillary 195 (H) 65 - 99 mg/dL  CBC with Differential      Status: Abnormal   Collection Time: 10/21/16  8:28 PM  Result Value Ref Range   WBC 15.2 (H) 3.8 - 10.6 K/uL   RBC 4.94 4.40 - 5.90 MIL/uL   Hemoglobin 13.9 13.0 - 18.0 g/dL   HCT 42.3 40.0 - 52.0 %   MCV 85.7 80.0 - 100.0 fL   MCH 28.1 26.0 - 34.0 pg   MCHC 32.8 32.0 - 36.0 g/dL   RDW 15.1 (H) 11.5 - 14.5 %   Platelets 298 150 - 440 K/uL   Neutrophils Relative % 72 %   Neutro Abs 11.1 (H) 1.4 - 6.5 K/uL   Lymphocytes Relative 12 %   Lymphs Abs 1.8 1.0 - 3.6 K/uL   Monocytes Relative 11 %   Monocytes Absolute 1.6 (H) 0.2 - 1.0 K/uL   Eosinophils Relative 4 %   Eosinophils Absolute 0.5 0 - 0.7 K/uL   Basophils Relative 1 %   Basophils Absolute 0.1 0 - 0.1 K/uL  Comprehensive metabolic panel     Status: Abnormal   Collection Time: 10/21/16  8:28 PM  Result Value Ref Range   Sodium 135 135 - 145 mmol/L   Potassium 3.8 3.5 - 5.1 mmol/L   Chloride 99 (L) 101 - 111 mmol/L   CO2 23 22 - 32 mmol/L   Glucose, Bld 162 (H) 65 - 99 mg/dL   BUN 29 (H) 6 - 20 mg/dL   Creatinine, Ser 1.18 0.61 - 1.24 mg/dL   Calcium 8.7 (L) 8.9 - 10.3 mg/dL   Total Protein 7.5 6.5 - 8.1 g/dL   Albumin 3.4 (L) 3.5 - 5.0 g/dL   AST 64 (H) 15 - 41 U/L   ALT 93 (H) 17 - 63 U/L   Alkaline Phosphatase 371 (H) 38 - 126 U/L   Total Bilirubin 0.8 0.3 - 1.2 mg/dL   GFR calc non Af Amer 57 (L) >60 mL/min   GFR calc Af Amer >60 >60 mL/min    Comment: (NOTE) The eGFR has been calculated using the CKD EPI equation. This calculation has not been validated in all clinical situations. eGFR's persistently <  60 mL/min signify possible Chronic Kidney Disease.    Anion gap 13 5 - 15  Troponin I     Status: None   Collection Time: 10/21/16  8:28 PM  Result Value Ref Range   Troponin I <0.03 <0.03 ng/mL  Glucose, capillary     Status: Abnormal   Collection Time: 10/22/16 12:49 AM  Result Value Ref Range   Glucose-Capillary 141 (H) 65 - 99 mg/dL  Hemoglobin A1c     Status: Abnormal   Collection Time: 10/22/16  5:22  AM  Result Value Ref Range   Hgb A1c MFr Bld 8.5 (H) 4.8 - 5.6 %    Comment: (NOTE)         Pre-diabetes: 5.7 - 6.4         Diabetes: >6.4         Glycemic control for adults with diabetes: <7.0    Mean Plasma Glucose 197 mg/dL    Comment: (NOTE) Performed At: [facility] [place], [place] [id number] Lindon Romp MD AJ:[id number]   Lipid panel     Status: Abnormal   Collection Time: 10/22/16  5:22 AM  Result Value Ref Range   Cholesterol 100 0 - 200 mg/dL   Triglycerides 78 <150 mg/dL   HDL 28 (L) >40 mg/dL   Total CHOL/HDL Ratio 3.6 RATIO   VLDL 16 0 - 40 mg/dL   LDL Cholesterol 56 0 - 99 mg/dL    Comment:        Total Cholesterol/HDL:CHD Risk Coronary Heart Disease Risk Table                     Men   Women  1/2 Average Risk   3.4   3.3  Average Risk       5.0   4.4  2 X Average Risk   9.6   7.1  3 X Average Risk  23.4   11.0        Use the calculated Patient Ratio above and the CHD Risk Table to determine the patient's CHD Risk.        ATP III CLASSIFICATION (LDL):  <100     mg/dL   Optimal  100-129  mg/dL   Near or Above                    Optimal  130-159  mg/dL   Borderline  160-189  mg/dL   High  >190     mg/dL   Very High   Protime-INR     Status: Abnormal   Collection Time: 10/22/16  5:22 AM  Result Value Ref Range   Prothrombin Time 51.4 (H) 11.4 - 15.2 seconds   INR 5.47 (HH)     Comment: CRITICAL RESULT CALLED TO, READ BACK BY AND VERIFIED WITH: Desiree Bomson @ [phone] 10/22/16 by [place]   Glucose, capillary     Status: None   Collection Time: 10/22/16  7:32 AM  Result Value Ref Range   Glucose-Capillary 86 65 - 99 mg/dL  CK     Status: None   Collection Time: 10/22/16  9:45 AM  Result Value Ref Range   Total CK 131 49 - 397 U/L  Lactate dehydrogenase     Status: None   Collection Time: 10/22/16  9:45 AM  Result Value Ref Range   LDH 172 98 - 192 U/L  Lactic acid, plasma     Status: None   Collection Time: 10/22/16  9:45  AM  Result Value Ref Range   Lactic Acid, Venous 1.4 0.5 - 1.9 mmol/L  Glucose, capillary     Status: Abnormal   Collection Time: 10/22/16 12:59 PM  Result Value Ref Range   Glucose-Capillary 133 (H) 65 - 99 mg/dL  Glucose, capillary     Status: Abnormal   Collection Time: 10/22/16  4:51 PM  Result Value Ref Range   Glucose-Capillary 115 (H) 65 - 99 mg/dL  Glucose, capillary     Status: Abnormal   Collection Time: 10/22/16  8:01 PM  Result Value Ref Range   Glucose-Capillary 145 (H) 65 - 99 mg/dL  Protime-INR     Status: Abnormal   Collection Time: 10/23/16  4:56 AM  Result Value Ref Range   Prothrombin Time 32.3 (H) 11.4 - 15.2 seconds   INR 3.06   Hepatic function panel     Status: Abnormal   Collection Time: 10/23/16  4:56 AM  Result Value Ref Range   Total Protein 6.4 (L) 6.5 - 8.1 g/dL   Albumin 2.8 (L) 3.5 - 5.0 g/dL   AST 32 15 - 41 U/L   ALT 54 17 - 63 U/L   Alkaline Phosphatase 269 (H) 38 - 126 U/L   Total Bilirubin 1.2 0.3 - 1.2 mg/dL   Bilirubin, Direct 0.2 0.1 - 0.5 mg/dL   Indirect Bilirubin 1.0 (H) 0.3 - 0.9 mg/dL  Urinalysis, Complete w Microscopic     Status: Abnormal   Collection Time: 10/23/16  6:26 AM  Result Value Ref Range   Color, Urine YELLOW (A) YELLOW   APPearance CLEAR (A) CLEAR   Specific Gravity, Urine 1.025 1.005 - 1.030   pH 5.0 5.0 - 8.0   Glucose, UA NEGATIVE NEGATIVE mg/dL   Hgb urine dipstick NEGATIVE NEGATIVE   Bilirubin Urine NEGATIVE NEGATIVE   Ketones, ur 5 (A) NEGATIVE mg/dL   Protein, ur 30 (A) NEGATIVE mg/dL   Nitrite NEGATIVE NEGATIVE   Leukocytes, UA NEGATIVE NEGATIVE   RBC / HPF 0-5 0 - 5 RBC/hpf   WBC, UA 0-5 0 - 5 WBC/hpf   Bacteria, UA NONE SEEN NONE SEEN   Squamous Epithelial / LPF 0-5 (A) NONE SEEN   Mucous PRESENT   Glucose, capillary     Status: Abnormal   Collection Time: 10/23/16  7:49 AM  Result Value Ref Range   Glucose-Capillary 121 (H) 65 - 99 mg/dL  Glucose, capillary     Status: Abnormal   Collection  Time: 10/23/16 11:25 AM  Result Value Ref Range   Glucose-Capillary 104 (H) 65 - 99 mg/dL  Glucose, capillary     Status: Abnormal   Collection Time: 10/23/16  4:33 PM  Result Value Ref Range   Glucose-Capillary 126 (H) 65 - 99 mg/dL  Glucose, capillary     Status: Abnormal   Collection Time: 10/23/16  9:20 PM  Result Value Ref Range   Glucose-Capillary 244 (H) 65 - 99 mg/dL  Protime-INR     Status: Abnormal   Collection Time: 10/24/16  5:36 AM  Result Value Ref Range   Prothrombin Time 29.9 (H) 11.4 - 15.2 seconds   INR 2.78   Glucose, capillary     Status: Abnormal   Collection Time: 10/24/16  7:42 AM  Result Value Ref Range   Glucose-Capillary 204 (H) 65 - 99 mg/dL  Vancomycin, trough     Status: Abnormal   Collection Time: 10/24/16  9:17 AM  Result Value Ref Range   Vancomycin  Tr 11 (L) 15 - 20 ug/mL  Glucose, capillary     Status: Abnormal   Collection Time: 10/24/16 10:50 AM  Result Value Ref Range   Glucose-Capillary 217 (H) 65 - 99 mg/dL  Glucose, capillary     Status: Abnormal   Collection Time: 10/24/16 11:59 AM  Result Value Ref Range   Glucose-Capillary 223 (H) 65 - 99 mg/dL  Glucose, capillary     Status: Abnormal   Collection Time: 10/24/16  5:02 PM  Result Value Ref Range   Glucose-Capillary 136 (H) 65 - 99 mg/dL  Glucose, capillary     Status: Abnormal   Collection Time: 12/09/16 10:40 AM  Result Value Ref Range   Glucose-Capillary 186 (H) 65 - 99 mg/dL  I-STAT 4, (NA,K, GLUC, HGB,HCT)     Status: Abnormal   Collection Time: 12/09/16 11:01 AM  Result Value Ref Range   Sodium 137 135 - 145 mmol/L   Potassium 3.8 3.5 - 5.1 mmol/L   Glucose, Bld 195 (H) 65 - 99 mg/dL   HCT 46.0 39.0 - 52.0 %   Hemoglobin 15.6 13.0 - 17.0 g/dL  Surgical pathology     Status: None   Collection Time: 12/09/16  1:20 PM  Result Value Ref Range   SURGICAL PATHOLOGY      Surgical Pathology CASE: [id number] PATIENT: Janelle Floor Surgical Pathology  Report     SPECIMEN SUBMITTED: A. Necrotic tissue, left foot B. Necrotic bone, left 4th and 5th metatarsal  CLINICAL HISTORY: None provided  PRE-OPERATIVE DIAGNOSIS: Gangrene of toe - left foot I96, foot ulcer - left L97.523  POST-OPERATIVE DIAGNOSIS: Same as pre-op     DIAGNOSIS: A. SOFT TISSUE, LEFT FOOT; DEBRIDEMENT: - NECROTIC SOFT TISSUE.  B. BONE, LEFT FOURTH AND FIFTH METATARSALS; EXCISION: - ACUTE OSTEOMYELITIS WITH PARTIAL NECROSIS. - CUT ENDS OF THE BONES APPEAR VIABLE   GROSS DESCRIPTION: A. Labeled: left foot necrotic tissue Tissue fragment(s):3 Size: aggregate, 2.4 x 2.2 x 0.8 cm Description: yellow gray fragments of soft tissue  Entirely submitted in one cassette(s).   B. Labeled: left/fifth metatarsal necrotic bone Tissue fragment(s): 2 Size: 2.5 x 1.5 x 1.2 and 2.3 x 1.5 x 1.4 cm Description: fragments of bone, each with 1 articular surface and  one cut surface, cut surfaces differentially inked specimen sectioned  Block summary 1-2-representative longitudinal sections of each following decalcification   Final Diagnosis performed by Bryan Lemma, MD.  Electronically signed 12/13/2016 8:36:56PM    The electronic signature indicates that the named Attending Pathologist has evaluated the specimen  Technical component performed at [facility], [address] [place], [place] [address] Lab: [phone] Dir: Darrick Penna. Evette Doffing, MD  Professional component performed at [facility], [facility], [place], [place], [place] [address] Lab: [phone] Dir: Dellia Nims. Reuel Derby, MD      Radiology No results found.  Assessment/Plan Hyperlipidemia lipid control important in reducing the progression of atherosclerotic disease. Continue statin therapy   Type 2 diabetes mellitus treated with insulin (HCC) blood glucose control important in reducing the progression of atherosclerotic disease. Also, involved in wound healing. On  appropriate medications.   Essential hypertension blood pressure control important in reducing the progression of atherosclerotic disease. On appropriate oral medications.  A-fib (HCC) On anticoagulation. Rapid A. fib lead to a prolonged hospitalization last time.  Atherosclerosis of native arteries of the extremities with ulceration ([place]) This remains a critical limb threatening situation. We discussed options today. With this persistent unrelenting pain, a below-knee amputation would certainly  be a reasonable option. Another option would be a repeat angiogram to see if there is any perfusion abnormalities that can be improved, and then either performing a wider debridement or a transmetatarsal amputation by Dr. Elvina Mattes. I think either option is reasonable. He really is not in a state of mind to be able to make any definitive decision today and certainly not one is definitive as a below-knee amputation. We are going to proceed with an angiogram next week after discussions with his wife. Pending the findings on this angiogram further surgical therapy will be planned.    Leotis Pain, MD  12/17/2016 11:51 AM    This note was created with Dragon medical transcription system.  Any errors from dictation are purely unintentional

## 2016-12-19 MED ORDER — CEFAZOLIN SODIUM-DEXTROSE 2-4 GM/100ML-% IV SOLN
2.0000 g | Freq: Once | INTRAVENOUS | Status: AC
  Administered 2016-12-20: 2 g via INTRAVENOUS

## 2016-12-20 ENCOUNTER — Encounter
Admission: RE | Disposition: A | Discharge status: Home / Self Care | Payer: Self-pay | Source: Ambulatory Visit | Attending: Vascular Surgery

## 2016-12-20 ENCOUNTER — Other Ambulatory Visit
Admission: RE | Admit: 2016-12-20 | Discharge: 2016-12-20 | Disposition: A | Discharge status: Home / Self Care | Payer: Medicare Other | Source: Ambulatory Visit | Attending: Vascular Surgery | Admitting: Vascular Surgery

## 2016-12-20 ENCOUNTER — Ambulatory Visit
Admission: RE | Admit: 2016-12-20 | Discharge: 2016-12-20 | Disposition: A | Discharge status: Home / Self Care | Payer: Medicare Other | Source: Ambulatory Visit | Attending: Vascular Surgery | Admitting: Vascular Surgery

## 2016-12-20 DIAGNOSIS — Z91041 Radiographic dye allergy status: Secondary | ICD-10-CM | POA: Insufficient documentation

## 2016-12-20 DIAGNOSIS — L97529 Non-pressure chronic ulcer of other part of left foot with unspecified severity: Secondary | ICD-10-CM | POA: Insufficient documentation

## 2016-12-20 DIAGNOSIS — E1122 Type 2 diabetes mellitus with diabetic chronic kidney disease: Secondary | ICD-10-CM | POA: Diagnosis not present

## 2016-12-20 DIAGNOSIS — G4730 Sleep apnea, unspecified: Secondary | ICD-10-CM | POA: Diagnosis not present

## 2016-12-20 DIAGNOSIS — Z9889 Other specified postprocedural states: Secondary | ICD-10-CM | POA: Insufficient documentation

## 2016-12-20 DIAGNOSIS — R32 Unspecified urinary incontinence: Secondary | ICD-10-CM | POA: Diagnosis not present

## 2016-12-20 DIAGNOSIS — Z8546 Personal history of malignant neoplasm of prostate: Secondary | ICD-10-CM | POA: Insufficient documentation

## 2016-12-20 DIAGNOSIS — I4891 Unspecified atrial fibrillation: Secondary | ICD-10-CM | POA: Diagnosis not present

## 2016-12-20 DIAGNOSIS — Z888 Allergy status to other drugs, medicaments and biological substances status: Secondary | ICD-10-CM | POA: Diagnosis not present

## 2016-12-20 DIAGNOSIS — E785 Hyperlipidemia, unspecified: Secondary | ICD-10-CM | POA: Diagnosis not present

## 2016-12-20 DIAGNOSIS — I509 Heart failure, unspecified: Secondary | ICD-10-CM | POA: Insufficient documentation

## 2016-12-20 DIAGNOSIS — Z794 Long term (current) use of insulin: Secondary | ICD-10-CM | POA: Diagnosis not present

## 2016-12-20 DIAGNOSIS — E11621 Type 2 diabetes mellitus with foot ulcer: Secondary | ICD-10-CM | POA: Diagnosis not present

## 2016-12-20 DIAGNOSIS — Z91040 Latex allergy status: Secondary | ICD-10-CM | POA: Insufficient documentation

## 2016-12-20 DIAGNOSIS — N529 Male erectile dysfunction, unspecified: Secondary | ICD-10-CM | POA: Insufficient documentation

## 2016-12-20 DIAGNOSIS — Z87891 Personal history of nicotine dependence: Secondary | ICD-10-CM | POA: Insufficient documentation

## 2016-12-20 DIAGNOSIS — Z9049 Acquired absence of other specified parts of digestive tract: Secondary | ICD-10-CM | POA: Insufficient documentation

## 2016-12-20 DIAGNOSIS — M1990 Unspecified osteoarthritis, unspecified site: Secondary | ICD-10-CM | POA: Insufficient documentation

## 2016-12-20 DIAGNOSIS — I130 Hypertensive heart and chronic kidney disease with heart failure and stage 1 through stage 4 chronic kidney disease, or unspecified chronic kidney disease: Secondary | ICD-10-CM | POA: Insufficient documentation

## 2016-12-20 DIAGNOSIS — N189 Chronic kidney disease, unspecified: Secondary | ICD-10-CM | POA: Diagnosis not present

## 2016-12-20 DIAGNOSIS — I70262 Atherosclerosis of native arteries of extremities with gangrene, left leg: Secondary | ICD-10-CM | POA: Diagnosis not present

## 2016-12-20 DIAGNOSIS — Z9079 Acquired absence of other genital organ(s): Secondary | ICD-10-CM | POA: Insufficient documentation

## 2016-12-20 DIAGNOSIS — Z955 Presence of coronary angioplasty implant and graft: Secondary | ICD-10-CM | POA: Insufficient documentation

## 2016-12-20 HISTORY — PX: LOWER EXTREMITY ANGIOGRAPHY: CATH118251

## 2016-12-20 LAB — CREATININE, SERUM
Creatinine, Ser: 1.14 mg/dL (ref 0.61–1.24)
GFR calc Af Amer: >60 mL/min (ref >60)
GFR calc non Af Amer: 59 mL/min — ABNORMAL LOW (ref >60)

## 2016-12-20 LAB — BUN: BUN: 21 mg/dL — ABNORMAL HIGH (ref 6–20)

## 2016-12-20 LAB — GLUCOSE, CAPILLARY
GLUCOSE-CAPILLARY: 200 mg/dL — AB (ref 65–99)
Glucose-Capillary: 148 mg/dL — ABNORMAL HIGH (ref 65–99)

## 2016-12-20 LAB — PROTIME-INR
INR: 2.57
PROTHROMBIN TIME: 28.1 s — AB (ref 11.4–15.2)

## 2016-12-20 SURGERY — LOWER EXTREMITY ANGIOGRAPHY
Anesthesia: Moderate Sedation | Laterality: Left

## 2016-12-20 MED ORDER — DIPHENHYDRAMINE HCL 50 MG/ML IJ SOLN
INTRAMUSCULAR | Status: AC
  Filled 2016-12-20: qty 1

## 2016-12-20 MED ORDER — DILTIAZEM HCL 30 MG PO TABS
30.0000 mg | ORAL_TABLET | Freq: Once | ORAL | Status: DC
  Filled 2016-12-20: qty 1

## 2016-12-20 MED ORDER — METHYLPREDNISOLONE SODIUM SUCC 125 MG IJ SOLR
125.0000 mg | INTRAMUSCULAR | Status: AC
  Administered 2016-12-20: 125 mg via INTRAVENOUS

## 2016-12-20 MED ORDER — MIDAZOLAM HCL 2 MG/2ML IJ SOLN
INTRAMUSCULAR | Status: DC | PRN
  Administered 2016-12-20 (×5): 1 mg via INTRAVENOUS

## 2016-12-20 MED ORDER — DILTIAZEM HCL 25 MG/5ML IV SOLN
5.0000 mg | Freq: Once | INTRAVENOUS | Status: DC
  Filled 2016-12-20: qty 5

## 2016-12-20 MED ORDER — SODIUM CHLORIDE 0.9 % IV SOLN
INTRAVENOUS | Status: DC
  Administered 2016-12-20: 09:00:00 via INTRAVENOUS

## 2016-12-20 MED ORDER — FAMOTIDINE IN NACL 20-0.9 MG/50ML-% IV SOLN
20.0000 mg | INTRAVENOUS | Status: AC
  Administered 2016-12-20: 20 mg via INTRAVENOUS
  Filled 2016-12-20: qty 50

## 2016-12-20 MED ORDER — HYDROMORPHONE HCL 1 MG/ML IJ SOLN: 0.5000 mg | INTRAMUSCULAR | Status: DC | PRN

## 2016-12-20 MED ORDER — HYDRALAZINE HCL 20 MG/ML IJ SOLN: 5.0000 mg | INTRAMUSCULAR | Status: DC | PRN

## 2016-12-20 MED ORDER — LABETALOL HCL 5 MG/ML IV SOLN: 10.0000 mg | INTRAVENOUS | Status: DC | PRN

## 2016-12-20 MED ORDER — ACETAMINOPHEN 325 MG PO TABS: 325.0000 mg | ORAL_TABLET | ORAL | Status: DC | PRN

## 2016-12-20 MED ORDER — ONDANSETRON HCL 4 MG/2ML IJ SOLN: 4.0000 mg | Freq: Four times a day (QID) | INTRAMUSCULAR | Status: DC | PRN

## 2016-12-20 MED ORDER — FENTANYL CITRATE (PF) 100 MCG/2ML IJ SOLN
INTRAMUSCULAR | Status: DC | PRN
  Administered 2016-12-20 (×2): 25 ug via INTRAVENOUS
  Administered 2016-12-20 (×2): 50 ug via INTRAVENOUS
  Administered 2016-12-20 (×2): 25 ug via INTRAVENOUS

## 2016-12-20 MED ORDER — HYDROCODONE-ACETAMINOPHEN 5-325 MG PO TABS: 1.0000 | ORAL_TABLET | Freq: Four times a day (QID) | ORAL | 0 refills | Status: DC | PRN

## 2016-12-20 MED ORDER — METOPROLOL TARTRATE 5 MG/5ML IV SOLN: 2.0000 mg | INTRAVENOUS | Status: DC | PRN

## 2016-12-20 MED ORDER — METOPROLOL TARTRATE 5 MG/5ML IV SOLN
5.0000 mg | Freq: Once | INTRAVENOUS | Status: AC
  Administered 2016-12-20: 5 mg via INTRAVENOUS

## 2016-12-20 MED ORDER — HEPARIN (PORCINE) IN NACL 2-0.9 UNIT/ML-% IJ SOLN
INTRAMUSCULAR | Status: AC
  Filled 2016-12-20: qty 1000

## 2016-12-20 MED ORDER — LIDOCAINE-EPINEPHRINE (PF) 2 %-1:200000 IJ SOLN
INTRAMUSCULAR | Status: AC
  Filled 2016-12-20: qty 20

## 2016-12-20 MED ORDER — METHYLPREDNISOLONE SODIUM SUCC 125 MG IJ SOLR
INTRAMUSCULAR | Status: AC
  Filled 2016-12-20: qty 2

## 2016-12-20 MED ORDER — LABETALOL HCL 5 MG/ML IV SOLN
20.0000 mg | Freq: Once | INTRAVENOUS | Status: AC
  Administered 2016-12-20: 20 mg via INTRAVENOUS

## 2016-12-20 MED ORDER — ACETAMINOPHEN 325 MG RE SUPP
325.0000 mg | RECTAL | Status: DC | PRN
  Filled 2016-12-20: qty 2

## 2016-12-20 MED ORDER — LABETALOL HCL 5 MG/ML IV SOLN
INTRAVENOUS | Status: AC
  Filled 2016-12-20: qty 4

## 2016-12-20 MED ORDER — PHENOL 1.4 % MT LIQD
1.0000 | OROMUCOSAL | Status: DC | PRN
  Filled 2016-12-20: qty 177

## 2016-12-20 MED ORDER — FENTANYL CITRATE (PF) 100 MCG/2ML IJ SOLN
INTRAMUSCULAR | Status: AC
  Filled 2016-12-20: qty 2

## 2016-12-20 MED ORDER — METOPROLOL TARTRATE 5 MG/5ML IV SOLN
INTRAVENOUS | Status: AC
  Filled 2016-12-20: qty 5

## 2016-12-20 MED ORDER — SODIUM CHLORIDE 0.9 % IV SOLN: 500.0000 mL | Freq: Once | INTRAVENOUS | Status: DC | PRN

## 2016-12-20 MED ORDER — HEPARIN SODIUM (PORCINE) 1000 UNIT/ML IJ SOLN
INTRAMUSCULAR | Status: AC
  Filled 2016-12-20: qty 1

## 2016-12-20 MED ORDER — ALTEPLASE 2 MG IJ SOLR
INTRAMUSCULAR | Status: DC | PRN
  Administered 2016-12-20: 8 mg

## 2016-12-20 MED ORDER — CEFAZOLIN SODIUM-DEXTROSE 2-4 GM/100ML-% IV SOLN
INTRAVENOUS | Status: AC
  Filled 2016-12-20: qty 100

## 2016-12-20 MED ORDER — OXYCODONE-ACETAMINOPHEN 5-325 MG PO TABS: 1.0000 | ORAL_TABLET | ORAL | Status: DC | PRN

## 2016-12-20 MED ORDER — MIDAZOLAM HCL 2 MG/ML PO SYRP: 6.0000 mg | ORAL_SOLUTION | ORAL | Status: DC | PRN

## 2016-12-20 MED ORDER — MIDAZOLAM HCL 5 MG/5ML IJ SOLN
INTRAMUSCULAR | Status: AC
  Filled 2016-12-20: qty 5

## 2016-12-20 MED ORDER — IOPAMIDOL (ISOVUE-300) INJECTION 61%
INTRAVENOUS | Status: DC | PRN
  Administered 2016-12-20: 45 mL via INTRAVENOUS

## 2016-12-20 MED ORDER — ALTEPLASE 2 MG IJ SOLR
INTRAMUSCULAR | Status: AC
  Filled 2016-12-20: qty 8

## 2016-12-20 MED ORDER — GUAIFENESIN-DM 100-10 MG/5ML PO SYRP
15.0000 mL | ORAL_SOLUTION | ORAL | Status: DC | PRN
  Filled 2016-12-20: qty 15

## 2016-12-20 MED ORDER — DIPHENHYDRAMINE HCL 50 MG/ML IJ SOLN
25.0000 mg | INTRAMUSCULAR | Status: AC
  Administered 2016-12-20: 25 mg via INTRAVENOUS

## 2016-12-20 SURGICAL SUPPLY — 20 items
BALLN LUTONIX 5X150X130 (BALLOONS) ×2
BALLN ULTRVRSE 2.5X300X150 (BALLOONS) ×2
BALLOON LUTONIX 5X150X130 (BALLOONS) ×1 IMPLANT
BALLOON ULTRVRSE 2.5X300X150 (BALLOONS) ×1 IMPLANT
CANISTER PENUMBRA MAX (MISCELLANEOUS) ×2 IMPLANT
CATH BEACON 5 .035 65 RIM TIP (CATHETERS) ×2 IMPLANT
CATH BEACON 5 .038 100 VERT TP (CATHETERS) ×2 IMPLANT
CATH INDIGO 6 ST-TIP 135CM (CATHETERS) ×2 IMPLANT
COVER PROBE U/S 5X48 (MISCELLANEOUS) ×2 IMPLANT
DEVICE PRESTO INFLATION (MISCELLANEOUS) ×2 IMPLANT
GLIDEWIRE STIFF .35X180X3 HYDR (WIRE) ×2 IMPLANT
PACK ANGIOGRAPHY (CUSTOM PROCEDURE TRAY) ×2 IMPLANT
SHEATH BRITE TIP 5FRX11 (SHEATH) ×2 IMPLANT
SHEATH PINNACLE ST 6F 45CM (SHEATH) ×2 IMPLANT
STENT VIABAHN 6X100X120 (Permanent Stent) ×2 IMPLANT
STENT VIABAHN 6X250X120 (Permanent Stent) ×2 IMPLANT
TUBING ASPIRATION INDIGO (MISCELLANEOUS) ×2 IMPLANT
WIRE G V18X300CM (WIRE) ×2 IMPLANT
WIRE J 3MM .035X145CM (WIRE) ×2 IMPLANT
WIRE MAGIC TORQUE 260C (WIRE) ×2 IMPLANT

## 2016-12-20 NOTE — Progress Notes (Signed)
Pharmacy unable to provide cardizem IR. Pt. Med. With IV med. Per new orders. Tele_ remains in A.Fib 120-130"s. Pt. Asymptomatic. Right groin intact.

## 2016-12-20 NOTE — Progress Notes (Signed)
Dr. Lucky Cowboy at bedside to speak with wife re: results. MD made aware of rapid Afib & cont. HTN. New orders received. Left foot with intermittent doppler pulse. PT doppler.Pt. Aware of self only.

## 2016-12-20 NOTE — Progress Notes (Signed)
Pharmacy states they do not have cardizem IV. Orders received for 1x Cardizem IR 30 mg p.o. . Pt. Restless, attempting to sit up frequently. Wife at bedside, calming him with words. Frequent reminders to pt. To keep head down. Right groin intact without complications at present.

## 2016-12-20 NOTE — Progress Notes (Signed)
PAD removed. Sterile 2x2's and tegaderm to Right groin site. No hematoma, edema, drainage , ecchymosis at site. DC instructions reviewed with pt. And spouse with verbalized understanding.

## 2016-12-20 NOTE — Progress Notes (Signed)
INR :2.57 called to MD.MD states he will still do case today. Left foot  [place] drsg . Loose, changed on arrival. Pt. Only has great toe and 2nd toe; all others amputated. Pt. States he has a "constant, steady pain" to his left foot. States it stays"about a '6' on scale 1-10. Pt. States "even after pain pills it (pain) is about a 5." Left foot elevated on arrival. Pt. Arrived with left foot boot on.

## 2016-12-20 NOTE — Progress Notes (Signed)
Patient here today for lower extremity angiogram per Dr Dew,attached to monitor for procedure, will follow vitals throughout entire procedure. Also placed on bipap per resp. Therapy for procedure.

## 2016-12-20 NOTE — H&P (Signed)
[facility] History & Physical Update  The patient was interviewed and re-examined.  The patient's previous History and Physical has been reviewed and is unchanged.  There is no change in the plan of care. We plan to proceed with the scheduled procedure.  Jason Dew, MD  12/20/2016, 8:07 AM    

## 2016-12-20 NOTE — Op Note (Signed)
[facility] Percutaneous Study/Intervention Procedural Note   Date of Surgery: 12/20/2016  Surgeon(s):DEW,JASON   Assistants:none  Pre-operative Diagnosis: PAD with gangrene left foot  Post-operative diagnosis: Same  Procedure(s) Performed: 1. Ultrasound guidance for vascular access right femoral artery 2. Catheter placement into left anterior tibial artery from right femoral approach 3. Selective left lower extremity angiogram 4. Catheter directed thrombolytic therapy to the left SFA, popliteal artery, and anterior tibial arteries 5. Mechanical thrombectomy with the penumbra cat 6 device to the left SFA, popliteal, and anterior tibial arteries  6.  Percutaneous transluminal angioplasty of the left SFA and popliteal arteries with 5 mm diameter angioplasty balloon including a Lutonix drug-coated angioplasty balloon proximally  7.  Percutaneous transluminal angioplasty of the left anterior tibial artery with 2.5 mm diameter by 30 cm length angioplasty balloon  8.  Viabahn covered stent placement 2 to the left SFA and popliteal arteries for residual occlusion after above interventions using a 6 mm diameter by 25 cm length stent and a 6 mm diameter by 10 cm length stent 9. StarClose closure device right femoral artery  EBL: 150 cc  Contrast: 45 cc  Fluoro Time: 8.8 minutes  Moderate Conscious Sedation Time: approximately 55 minutes using 5 mg of Versed and 200 mcg of Fentanyl  Indications: Patient is a 82 y.o.male with persistent infection, ulceration and gangrenous changes on the left foot despite multiple surgical procedures for debridement and previous revascularization. The patient is brought in for angiography for further evaluation and potential treatment. Risks and benefits are discussed and informed consent is obtained  Procedure: The patient was identified  and appropriate procedural time out was performed. The patient was then placed supine on the table and prepped and draped in the usual sterile fashion.Moderate conscious sedation was administered during a face to face encounter with the patient throughout the procedure with my supervision of the RN administering medicines and monitoring the patient's vital signs, pulse oximetry, telemetry and mental status throughout from the start of the procedure until the patient was taken to the recovery room. Ultrasound was used to evaluate the right common femoral artery. It was patent . A digital ultrasound image was acquired. A Seldinger needle was used to access the right common femoral artery under direct ultrasound guidance and a permanent image was performed. A 0.035 J wire was advanced without resistance and a 5Fr sheath was placed.No aortogram was performed as this had previously been normal and inflow disease was not suspected. I then crossed the aortic bifurcation and advanced to the left femoral head. Selective left lower extremity angiogram was then performed. This demonstrated a common femoral artery and profunda femoris artery that appeared widely patent. There was then an abrupt occlusion of the left SFA a centimeter or 2 above the previously placed stent consistent with acute on chronic occlusion. There was very poor reconstitution distally on initial imaging which was also very difficult because of continuous patient motion. Later, we were able to demonstrate an anterior tibial artery which is occluded after the proximal segment and a diseased peroneal artery that was also occluded proximally. The patient was systemically heparinized and a 6 [place] destination sheath was then placed over the [employer] wire. I then used a Kumpe catheter and the advantage wire to easily cross the occlusion and confirm intraluminal flow in the popliteal artery. This was where the disease runoff was identified. It  was reasonably clear this was thrombotic in addition to advanced atherosclerosis I elected to place  8 mg of TPA in the left SFA, popliteal artery, and anterior tibial arteries. After this was allowed to dwell for several minutes, the penumbra cat 6 device was then brought onto the field and used to perform mechanical thrombectomy of the left SFA, popliteal artery, and anterior tibial arteries. Several passes were performed with significant return of thrombus, but continued occlusion and poor flow was seen despite these interventions. I then performed angioplasty of the SFA from a few centimeters beyond its origin all the way down to the popliteal artery below the knee. A 5 mm diameter by 15 cm length Lutonix drug-coated angioplasty balloon was used for the proximal inflation, and then the same balloon was advanced to perform angioplasty in the mid and distal SFA and popliteal artery. 3 inflations were required in each inflation was from 8-10 atm for 1 minute. Following this, no flow was again seen. Another pass with the penumbra cat 6 device showed no improvement. I then performed angioplasty of the anterior tibial artery to try to improve the runoff. I exchanged for a 0.018 wire and parked this in the foot. A 2.5 mm diameter by 30 cm length angioplasty balloon was inflated from just proximal to the origin of the anterior tibial artery down to just above the ankle. This was taken to 12 atm for 1 minute. On completion angiogram with the catheter in the popliteal artery, and the did not appear to be any greater than 40% residual stenosis but the flow was extremely sluggish. It was also very difficult to image because the patient would not stop moving despite a large amount of sedation. The SFA and popliteal arteries appear to have continued occlusion although opacification was difficult. I covered these areas with a 6 mm diameter by 25 cm length stent from just above the knee up to the mid SFA and then a 6 mm diameter  by 10 cm length stent for the proximal SFA to extend this about 4-5 cm proximal to the previously placed stent in a few centimeters below the SFA origin. These were postdilated with a 5 mm balloon. Completion angiogram showed no obvious residual stenosis or occlusion within the SFA, popliteal artery, or anterior tibial artery, but the flow remained extremely sluggish. The patient remained in atrial fibrillation with rapid ventricular response throughout the entire procedure and had continued motion making imaging difficult. At this point, I felt we had done all we could do and I elected to terminate the procedure. The sheath was removed and StarClose closure device was deployed in the right femoral artery with excellent hemostatic result. The patient was taken to the recovery room in stable condition having tolerated the procedure well.  Findings:   Left Lower Extremity: The common femoral artery and profunda femoris artery appeared widely patent. There was then an abrupt occlusion of the left SFA a centimeter or 2 above the previously placed stent consistent with acute on chronic occlusion. There was very poor reconstitution distally on initial imaging which was also very difficult because of continuous patient motion. Later, we were able to demonstrate an anterior tibial artery which is occluded after the proximal segment and a diseased peroneal artery that was also occluded proximally.   Disposition: Patient was taken to the recovery room in stable condition having tolerated the procedure well.  Complications: None  Leotis Pain 12/20/2016 12:39 PM   This note was created with Dragon Medical transcription system. Any errors in dictation are purely unintentional.

## 2016-12-21 ENCOUNTER — Encounter: Payer: Self-pay | Admitting: Vascular Surgery

## 2016-12-29 ENCOUNTER — Encounter: Payer: Self-pay | Admitting: Emergency Medicine

## 2016-12-29 ENCOUNTER — Emergency Department: Payer: Medicare Other

## 2016-12-29 ENCOUNTER — Observation Stay
Admission: EM | Admit: 2016-12-29 | Discharge: 2016-12-30 | Disposition: A | Discharge status: Home / Self Care | Payer: Medicare Other | Attending: Internal Medicine | Admitting: Internal Medicine

## 2016-12-29 DIAGNOSIS — I739 Peripheral vascular disease, unspecified: Secondary | ICD-10-CM | POA: Diagnosis present

## 2016-12-29 DIAGNOSIS — Z7901 Long term (current) use of anticoagulants: Secondary | ICD-10-CM | POA: Insufficient documentation

## 2016-12-29 DIAGNOSIS — E1152 Type 2 diabetes mellitus with diabetic peripheral angiopathy with gangrene: Secondary | ICD-10-CM | POA: Diagnosis not present

## 2016-12-29 DIAGNOSIS — E785 Hyperlipidemia, unspecified: Secondary | ICD-10-CM | POA: Insufficient documentation

## 2016-12-29 DIAGNOSIS — I10 Essential (primary) hypertension: Secondary | ICD-10-CM | POA: Insufficient documentation

## 2016-12-29 DIAGNOSIS — Z9989 Dependence on other enabling machines and devices: Secondary | ICD-10-CM | POA: Diagnosis not present

## 2016-12-29 DIAGNOSIS — Z7984 Long term (current) use of oral hypoglycemic drugs: Secondary | ICD-10-CM | POA: Insufficient documentation

## 2016-12-29 DIAGNOSIS — I4891 Unspecified atrial fibrillation: Secondary | ICD-10-CM | POA: Insufficient documentation

## 2016-12-29 DIAGNOSIS — G4733 Obstructive sleep apnea (adult) (pediatric): Secondary | ICD-10-CM | POA: Insufficient documentation

## 2016-12-29 DIAGNOSIS — E119 Type 2 diabetes mellitus without complications: Secondary | ICD-10-CM

## 2016-12-29 DIAGNOSIS — L97529 Non-pressure chronic ulcer of other part of left foot with unspecified severity: Secondary | ICD-10-CM | POA: Insufficient documentation

## 2016-12-29 DIAGNOSIS — E11621 Type 2 diabetes mellitus with foot ulcer: Secondary | ICD-10-CM | POA: Diagnosis not present

## 2016-12-29 DIAGNOSIS — Z87891 Personal history of nicotine dependence: Secondary | ICD-10-CM | POA: Insufficient documentation

## 2016-12-29 DIAGNOSIS — M79672 Pain in left foot: Secondary | ICD-10-CM | POA: Diagnosis present

## 2016-12-29 DIAGNOSIS — I96 Gangrene, not elsewhere classified: Secondary | ICD-10-CM | POA: Insufficient documentation

## 2016-12-29 DIAGNOSIS — Z7982 Long term (current) use of aspirin: Secondary | ICD-10-CM | POA: Diagnosis not present

## 2016-12-29 DIAGNOSIS — E1122 Type 2 diabetes mellitus with diabetic chronic kidney disease: Secondary | ICD-10-CM | POA: Insufficient documentation

## 2016-12-29 DIAGNOSIS — Z79899 Other long term (current) drug therapy: Secondary | ICD-10-CM | POA: Insufficient documentation

## 2016-12-29 DIAGNOSIS — N189 Chronic kidney disease, unspecified: Secondary | ICD-10-CM | POA: Insufficient documentation

## 2016-12-29 DIAGNOSIS — E876 Hypokalemia: Secondary | ICD-10-CM | POA: Diagnosis not present

## 2016-12-29 DIAGNOSIS — A419 Sepsis, unspecified organism: Principal | ICD-10-CM | POA: Insufficient documentation

## 2016-12-29 DIAGNOSIS — E118 Type 2 diabetes mellitus with unspecified complications: Secondary | ICD-10-CM

## 2016-12-29 DIAGNOSIS — I482 Chronic atrial fibrillation, unspecified: Secondary | ICD-10-CM | POA: Diagnosis present

## 2016-12-29 DIAGNOSIS — L97509 Non-pressure chronic ulcer of other part of unspecified foot with unspecified severity: Secondary | ICD-10-CM

## 2016-12-29 DIAGNOSIS — Z794 Long term (current) use of insulin: Secondary | ICD-10-CM

## 2016-12-29 LAB — COMPREHENSIVE METABOLIC PANEL
ALT: 16 U/L — AB (ref 17–63)
ANION GAP: 15 (ref 5–15)
AST: 24 U/L (ref 15–41)
Albumin: 2.9 g/dL — ABNORMAL LOW (ref 3.5–5.0)
Alkaline Phosphatase: 152 U/L — ABNORMAL HIGH (ref 38–126)
BUN: 21 mg/dL — ABNORMAL HIGH (ref 6–20)
CHLORIDE: 100 mmol/L — AB (ref 101–111)
CO2: 21 mmol/L — AB (ref 22–32)
CREATININE: 1.07 mg/dL (ref 0.61–1.24)
Calcium: 8.6 mg/dL — ABNORMAL LOW (ref 8.9–10.3)
GFR calc Af Amer: >60 mL/min (ref >60)
Glucose, Bld: 239 mg/dL — ABNORMAL HIGH (ref 65–99)
POTASSIUM: 2.9 mmol/L — AB (ref 3.5–5.1)
SODIUM: 136 mmol/L (ref 135–145)
Total Bilirubin: 1.3 mg/dL — ABNORMAL HIGH (ref 0.3–1.2)
Total Protein: 6.8 g/dL (ref 6.5–8.1)

## 2016-12-29 LAB — CBC WITH DIFFERENTIAL/PLATELET
Basophils Absolute: 0.1 K/uL (ref 0–0.1)
Basophils Relative: 0 %
Eosinophils Absolute: 0 K/uL (ref 0–0.7)
Eosinophils Relative: 0 %
HCT: 39.1 % — ABNORMAL LOW (ref 40.0–52.0)
Hemoglobin: 13 g/dL (ref 13.0–18.0)
Lymphocytes Relative: 6 %
Lymphs Abs: 1 K/uL (ref 1.0–3.6)
MCH: 27.2 pg (ref 26.0–34.0)
MCHC: 33.2 g/dL (ref 32.0–36.0)
MCV: 81.9 fL (ref 80.0–100.0)
Monocytes Absolute: 1 K/uL (ref 0.2–1.0)
Monocytes Relative: 6 %
Neutro Abs: 15.4 K/uL — ABNORMAL HIGH (ref 1.4–6.5)
Neutrophils Relative %: 88 %
Platelets: 348 K/uL (ref 150–440)
RBC: 4.77 MIL/uL (ref 4.40–5.90)
RDW: 16.2 % — ABNORMAL HIGH (ref 11.5–14.5)
WBC: 17.4 K/uL — ABNORMAL HIGH (ref 3.8–10.6)

## 2016-12-29 LAB — URINALYSIS, COMPLETE (UACMP) WITH MICROSCOPIC
Bacteria, UA: NONE SEEN
Bilirubin Urine: NEGATIVE
Ketones, ur: 20 mg/dL — AB
Leukocytes, UA: NEGATIVE
NITRITE: NEGATIVE
Protein, ur: NEGATIVE mg/dL
SPECIFIC GRAVITY, URINE: 1.028 (ref 1.005–1.030)
pH: 5 (ref 5.0–8.0)

## 2016-12-29 LAB — GLUCOSE, CAPILLARY: GLUCOSE-CAPILLARY: 152 mg/dL — AB (ref 65–99)

## 2016-12-29 LAB — PROTIME-INR
INR: 4.59
PROTHROMBIN TIME: 44.7 s — AB (ref 11.4–15.2)

## 2016-12-29 LAB — LACTIC ACID, PLASMA
LACTIC ACID, VENOUS: 3 mmol/L — AB (ref 0.5–1.9)
Lactic Acid, Venous: 2.5 mmol/L (ref 0.5–1.9)

## 2016-12-29 MED ORDER — INSULIN ASPART 100 UNIT/ML ~~LOC~~ SOLN: 0.0000 [IU] | Freq: Three times a day (TID) | SUBCUTANEOUS | Status: DC

## 2016-12-29 MED ORDER — SODIUM CHLORIDE 0.9 % IV BOLUS (SEPSIS)
1000.0000 mL | Freq: Once | INTRAVENOUS | Status: AC
  Administered 2016-12-29: 1000 mL via INTRAVENOUS

## 2016-12-29 MED ORDER — ATORVASTATIN CALCIUM 20 MG PO TABS: 80.0000 mg | ORAL_TABLET | Freq: Every day | ORAL | Status: DC

## 2016-12-29 MED ORDER — ASPIRIN EC 81 MG PO TBEC: 81.0000 mg | DELAYED_RELEASE_TABLET | Freq: Every day | ORAL | Status: DC

## 2016-12-29 MED ORDER — ACETAMINOPHEN 650 MG RE SUPP: 650.0000 mg | Freq: Four times a day (QID) | RECTAL | Status: DC | PRN

## 2016-12-29 MED ORDER — SODIUM CHLORIDE 0.9 % IV SOLN
1.0000 g | Freq: Once | INTRAVENOUS | Status: AC
  Administered 2016-12-30: 1 g via INTRAVENOUS
  Filled 2016-12-29: qty 10

## 2016-12-29 MED ORDER — SODIUM CHLORIDE 0.9 % IV SOLN
INTRAVENOUS | Status: AC
  Administered 2016-12-29: 23:00:00 via INTRAVENOUS

## 2016-12-29 MED ORDER — POTASSIUM CHLORIDE CRYS ER 20 MEQ PO TBCR
40.0000 meq | EXTENDED_RELEASE_TABLET | Freq: Once | ORAL | Status: AC
  Administered 2016-12-29: 40 meq via ORAL
  Filled 2016-12-29: qty 2

## 2016-12-29 MED ORDER — METOPROLOL SUCCINATE ER 50 MG PO TB24
100.0000 mg | ORAL_TABLET | Freq: Every day | ORAL | Status: DC
  Administered 2016-12-30: 100 mg via ORAL
  Filled 2016-12-29: qty 2

## 2016-12-29 MED ORDER — PIPERACILLIN-TAZOBACTAM 3.375 G IVPB
3.3750 g | Freq: Three times a day (TID) | INTRAVENOUS | Status: DC
  Administered 2016-12-30: 3.375 g via INTRAVENOUS
  Filled 2016-12-29: qty 50

## 2016-12-29 MED ORDER — ONDANSETRON HCL 4 MG/2ML IJ SOLN: 4.0000 mg | Freq: Four times a day (QID) | INTRAMUSCULAR | Status: DC | PRN

## 2016-12-29 MED ORDER — METOPROLOL TARTRATE 5 MG/5ML IV SOLN
5.0000 mg | INTRAVENOUS | Status: AC | PRN
  Administered 2016-12-29 – 2016-12-30 (×3): 5 mg via INTRAVENOUS
  Filled 2016-12-29 (×3): qty 5

## 2016-12-29 MED ORDER — HYDROCODONE-ACETAMINOPHEN 5-325 MG PO TABS
1.0000 | ORAL_TABLET | Freq: Four times a day (QID) | ORAL | Status: DC | PRN
  Administered 2016-12-29 – 2016-12-30 (×2): 1 via ORAL
  Filled 2016-12-29 (×2): qty 1

## 2016-12-29 MED ORDER — SODIUM CHLORIDE 0.9 % IV BOLUS (SEPSIS): 1000.0000 mL | INTRAVENOUS | Status: DC | PRN

## 2016-12-29 MED ORDER — DILTIAZEM HCL ER COATED BEADS 300 MG PO CP24
300.0000 mg | ORAL_CAPSULE | Freq: Every day | ORAL | Status: DC
  Administered 2016-12-30: 300 mg via ORAL
  Filled 2016-12-29: qty 1

## 2016-12-29 MED ORDER — GABAPENTIN 100 MG PO CAPS: 100.0000 mg | ORAL_CAPSULE | Freq: Every day | ORAL | Status: DC | PRN

## 2016-12-29 MED ORDER — ONDANSETRON HCL 4 MG PO TABS
4.0000 mg | ORAL_TABLET | Freq: Four times a day (QID) | ORAL | Status: DC | PRN
Start: 2016-12-29 — End: 2016-12-30

## 2016-12-29 MED ORDER — ACETAMINOPHEN 325 MG PO TABS: 650.0000 mg | ORAL_TABLET | Freq: Four times a day (QID) | ORAL | Status: DC | PRN

## 2016-12-29 MED ORDER — PIPERACILLIN-TAZOBACTAM 3.375 G IVPB 30 MIN
3.3750 g | Freq: Once | INTRAVENOUS | Status: AC
  Administered 2016-12-29: 3.375 g via INTRAVENOUS

## 2016-12-29 MED ORDER — POTASSIUM CHLORIDE 10 MEQ/100ML IV SOLN
10.0000 meq | Freq: Once | INTRAVENOUS | Status: AC
  Administered 2016-12-29: 10 meq via INTRAVENOUS
  Filled 2016-12-29 (×2): qty 100

## 2016-12-29 MED ORDER — VANCOMYCIN HCL IN DEXTROSE 1-5 GM/200ML-% IV SOLN
1000.0000 mg | Freq: Once | INTRAVENOUS | Status: AC
  Administered 2016-12-29: 1000 mg via INTRAVENOUS
  Filled 2016-12-29: qty 200

## 2016-12-29 MED ORDER — VANCOMYCIN HCL 10 G IV SOLR
1250.0000 mg | INTRAVENOUS | Status: DC
  Administered 2016-12-30: 1250 mg via INTRAVENOUS
  Filled 2016-12-29 (×2): qty 1250

## 2016-12-29 MED ORDER — PIPERACILLIN-TAZOBACTAM 3.375 G IVPB 30 MIN
INTRAVENOUS | Status: AC
  Filled 2016-12-29: qty 50

## 2016-12-29 NOTE — ED Notes (Signed)
Called pharmacy to send K+ 

## 2016-12-29 NOTE — Progress Notes (Signed)
Pharmacy Antibiotic Note  [name] is a 82 y.o. male admitted on 12/29/2016 with sepsis.  Pharmacy has been consulted for vancomycin and Zosyn dosing.  Plan: DW 74kg  Vd 52L kei 0,053 hr-1  T1/2 13 hours Vancomycin 1250 mg q 18 hours ordered with stacked dosing. Level before 5th dose. Goal trough 15-20.  Zosyn 3.375 grams q 8 hours ordered Height: 5\' 7"  (170.2 cm) Weight: 190 lb (86.2 kg) IBW/kg (Calculated) : 66.1  Temp (24hrs), Avg:97.9 F (36.6 C), Min:97.9 F (36.6 C), Max:97.9 F (36.6 C)   Recent Labs Lab 12/29/16 1729 12/29/16 2150  WBC 17.4*  --   CREATININE 1.07  --   LATICACIDVEN 2.5* 3.0*    Estimated Creatinine Clearance: 58.7 mL/min (by C-G formula based on SCr of 1.07 mg/dL).    Allergies  Allergen Reactions  . Contrast Media [Iodinated Diagnostic Agents] Shortness Of Breath    SOB  . Iohexol Shortness Of Breath     Desc: Respiratory Distress, Laryngedema, diaphoresis, Onset Date: [id number]   . Latex Rash    Antimicrobials this admission: Vancomycin, Zosyn 8/8  >>    >>   Dose adjustments this admission:   Microbiology results: 8/8 BCx: pending       8/8 CXR: L base opacity 8/8 UA: (-)  Thank you for allowing pharmacy to be a part of this patient's care.  Chaysen Tillman S 12/29/2016 11:34 PM

## 2016-12-29 NOTE — ED Provider Notes (Signed)
[facility] Emergency Department Provider Note  ____________________________________________  Time seen: Approximately 8:45 PM  I have reviewed the triage vital signs and the nursing notes.   HISTORY  Chief Complaint Foot Pain   HPI [name] is a 82 y.o. male with a history of peripheral vascular disease who presents for L foot pain and infection. Patient's foot has been followed by home health nurse who today noticed a foul-smelling discharge and low-grade fever and recommended the patient comes to the emergency room.patient's been followed by Dr. Lucky Cowboy for gangrene of the left foot. He is complaining of constant, moderate, non radiating pain in the L foot worse with weight bearing. No fever, chills, nausea, or vomiting. Low grade temp today at home. Foul smelling discharge for the last few days. Not on abx at this time.   Past Medical History:  Diagnosis Date  . Arthritis   . Atrial fibrillation ([place])   . Carcinoma of prostate ([place])   . CHF (congestive heart failure) ([place])   . Chronic kidney disease   . Diabetes mellitus without complication ([place])   . ED (erectile dysfunction)   . Frequent urination   . Hyperlipidemia   . Hypertension   . Moderate mitral insufficiency   . Peripheral vascular disease ([place])   . Pneumonia 04/2016  . Prostate cancer ([place])   . Sleep apnea    OSA--USE C-PAP  . Ulcer of left foot due to type 2 diabetes mellitus ([place])   . Urinary stress incontinence, male     Patient Active Problem List   Diagnosis Date Noted  . Diabetic foot ulcer ([place]) 12/29/2016  . Sepsis ([place]) 12/29/2016  . Ataxia 10/21/2016  . History of femoral angiogram 09/06/2016  . Left leg pain 09/06/2016  . Leukocytosis 09/06/2016  . Generalized weakness 09/06/2016  . Acute renal injury ([place]) 09/06/2016  . A-fib ([place]) 08/30/2016  . Pneumonia 05/19/2016  . Hyperlipidemia 04/20/2016  . Leg pain, left 04/19/2016  . Back pain 04/19/2016  . Type  2 diabetes mellitus treated with insulin ([place]) 04/19/2016  . Essential hypertension 04/19/2016  . PAD (peripheral artery disease) ([place]) 04/19/2016  . Erectile dysfunction following radical prostatectomy 06/25/2015  . History of prostate cancer 12/23/2014  . Incontinence 12/23/2014    Past Surgical History:  Procedure Laterality Date  . APLIGRAFT PLACEMENT Left 10/15/2016   Procedure: APLIGRAFT PLACEMENT;  Surgeon: Albertine Patricia, DPM;  Location: [facility] ORS;  Service: Podiatry;  Laterality: Left;  necrotic ulcer  . CHOLECYSTECTOMY    . EYE SURGERY Bilateral    Cataract Extraction with IOL  . IRRIGATION AND DEBRIDEMENT FOOT Left 10/15/2016   Procedure: IRRIGATION AND DEBRIDEMENT FOOT-EXCISIONAL DEBRIDEMENT OF SKIN 3RD, 4TH AND 5TH TOES WITH APPLICATION OF APLIGRAFT;  Surgeon: Albertine Patricia, DPM;  Location: [facility] ORS;  Service: Podiatry;  Laterality: Left;  necrotic,gangrene  . IRRIGATION AND DEBRIDEMENT FOOT Left 12/09/2016   Procedure: IRRIGATION AND DEBRIDEMENT FOOT-MUSCLE/FASCIA, RESECTION OF FOURTH AND FIFTH METATARSAL NECROTIC BONE;  Surgeon: Albertine Patricia, DPM;  Location: [facility] ORS;  Service: Podiatry;  Laterality: Left;  . LOWER EXTREMITY ANGIOGRAPHY Left 08/16/2016   Procedure: Lower Extremity Angiography;  Surgeon: Algernon Huxley, MD;  Location: [place] [facility];  Service: Cardiovascular;  Laterality: Left;  . LOWER EXTREMITY ANGIOGRAPHY Left 09/01/2016   Procedure: Lower Extremity Angiography;  Surgeon: Algernon Huxley, MD;  Location: [place] [facility];  Service: Cardiovascular;  Laterality: Left;  . LOWER EXTREMITY ANGIOGRAPHY Left 10/14/2016   Procedure: Lower Extremity  Angiography;  Surgeon: Algernon Huxley, MD;  Location: [place] [facility];  Service: Cardiovascular;  Laterality: Left;  . LOWER EXTREMITY ANGIOGRAPHY Left 12/20/2016   Procedure: Lower Extremity Angiography;  Surgeon: Algernon Huxley, MD;  Location: [place] [facility];  Service: Cardiovascular;  Laterality: Left;  .  LOWER EXTREMITY INTERVENTION  09/01/2016   Procedure: Lower Extremity Intervention;  Surgeon: Algernon Huxley, MD;  Location: [place] [facility];  Service: Cardiovascular;;  . PROSTATE SURGERY     removal  . TONSILLECTOMY     as a child    Prior to Admission medications   Medication Sig Start Date End Date Taking? Authorizing Provider  acetaminophen (TYLENOL) 500 MG tablet Take 500 mg by mouth every 6 (six) hours as needed for moderate pain.   Yes [provider]  aspirin EC 81 MG tablet Take 1 tablet (81 mg total) by mouth daily. 08/16/16  Yes Dew, Erskine Squibb, MD  atorvastatin (LIPITOR) 80 MG tablet Take 80 mg by mouth daily at 6 PM.    Yes [provider]  diltiazem (CARDIZEM CD) 300 MG 24 hr capsule Take 300 mg by mouth daily.   Yes [provider]  furosemide (LASIX) 20 MG tablet TAKE 1 TABLET DAILY 10/24/15  Yes [provider]  gabapentin (NEURONTIN) 100 MG capsule Take 100 mg by mouth daily as needed (nerve pain).    Yes [provider]  HYDROcodone-acetaminophen (NORCO) 5-325 MG tablet Take 1 tablet by mouth every 6 (six) hours as needed for moderate pain. 12/20/16  Yes Dew, Erskine Squibb, MD  JARDIANCE 10 MG TABS tablet Take 10 mg by mouth daily. 11/30/16  Yes [provider]  metFORMIN (GLUCOPHAGE) 1000 MG tablet TAKE 1 TABLET TWICE A DAY WITH MEALS 11/28/15  Yes [provider]  TOPROL XL 100 MG 24 hr tablet Take 100 mg by mouth daily. 12/25/16  Yes [provider]  TRULICITY 4.25 ZD/6.3OV SOPN 0.75 mg once a week. 11/14/16  Yes [provider]  warfarin (COUMADIN) 5 MG tablet Take 5 mg by mouth every evening.    Yes [provider]  insulin glargine (LANTUS) 100 UNIT/ML injection Inject 0.24 mLs (24 Units total) into the skin at bedtime. Patient not taking: Reported on 12/20/2016 09/06/16   Theodoro Grist, MD  metoprolol (LOPRESSOR) 100 MG tablet Take 1 tablet (100 mg total) by mouth 2 (two) times daily. Patient  not taking: Reported on 12/29/2016 09/06/16   Theodoro Grist, MD  polyethylene glycol (MIRALAX / GLYCOLAX) packet Take 17 g by mouth daily as needed for mild constipation. Patient not taking: Reported on 12/29/2016 09/06/16   Theodoro Grist, MD  traMADol (ULTRAM) 50 MG tablet Take 1 tablet (50 mg total) by mouth every 6 (six) hours as needed for moderate pain. Patient not taking: Reported on 12/29/2016 10/24/16   Henreitta Leber, MD    Allergies Contrast media [iodinated diagnostic agents]; Iohexol; and Latex  Family History  Problem Relation Age of Onset  . Hypertension Mother   . Prostate cancer Neg Hx   . Bladder Cancer Neg Hx   . Kidney disease Neg Hx     Social History Social History  Substance Use Topics  . Smoking status: Former Smoker    Packs/day: 0.25    Types: Cigarettes    Quit date: 10/14/1994  . Smokeless tobacco: Never Used  . Alcohol use No    Review of Systems  Constitutional: + fever. Eyes: Negative for visual changes.  ENT: Negative for sore throat. Neck: No neck pain  Cardiovascular: Negative for chest pain. Respiratory: Negative for shortness of breath. Gastrointestinal: Negative for abdominal pain, vomiting or diarrhea. Genitourinary: Negative for dysuria. Musculoskeletal: Negative for back pain. + L foot swelling, pain, necrosis Skin: Negative for rash. Neurological: Negative for headaches, weakness or numbness. Psych: No SI or HI  ____________________________________________   PHYSICAL EXAM:  VITAL SIGNS: ED Triage Vitals  Enc Vitals Group     BP 12/29/16 1726 118/84     Pulse Rate 12/29/16 1726 96     Resp --      Temp 12/29/16 1726 97.9 F (36.6 C)     Temp Source 12/29/16 1726 Oral     SpO2 12/29/16 1726 96 %     Weight 12/29/16 1725 190 lb (86.2 kg)     Height 12/29/16 1725 5\' 7"  (1.702 m)     Head Circumference --      Peak Flow --      Pain Score 12/29/16 1724 6     Pain Loc --      Pain Edu? --      Excl. in [place]? --      Constitutional: Alert and oriented. Well appearing and in no apparent distress. HEENT:      Head: Normocephalic and atraumatic.         Eyes: Conjunctivae are normal. Sclera is non-icteric.       Mouth/Throat: Mucous membranes are moist.       Neck: Supple with no signs of meningismus. Cardiovascular: Regular rate and rhythm. No murmurs, gallops, or rubs. 2+ symmetrical distal pulses are present in all extremities. No JVD. Respiratory: Normal respiratory effort. Lungs are clear to auscultation bilaterally. No wheezes, crackles, or rhonchi.  Gastrointestinal: Soft, non tender, and non distended with positive bowel sounds. No rebound or guarding. Musculoskeletal: Necrosis of the L foot and toe with open wound with foul discharge and streaking erythema crossing the ankle the joint  Neurologic: Normal speech and language. Face is symmetric. Moving all extremities. No gross focal neurologic deficits are appreciated. Skin: Skin is warm, dry and intact. No rash noted. Psychiatric: Mood and affect are normal. Speech and behavior are normal.  ____________________________________________   LABS (all labs ordered are listed, but only abnormal results are displayed)  Labs Reviewed  LACTIC ACID, PLASMA - Abnormal; Notable for the following:       Result Value   Lactic Acid, Venous 2.5 (*)    All other components within normal limits  COMPREHENSIVE METABOLIC PANEL - Abnormal; Notable for the following:    Potassium 2.9 (*)    Chloride 100 (*)    CO2 21 (*)    Glucose, Bld 239 (*)    BUN 21 (*)    Calcium 8.6 (*)    Albumin 2.9 (*)    ALT 16 (*)    Alkaline Phosphatase 152 (*)    Total Bilirubin 1.3 (*)    All other components within normal limits  CBC WITH DIFFERENTIAL/PLATELET - Abnormal; Notable for the following:    WBC 17.4 (*)    HCT 39.1 (*)    RDW 16.2 (*)    Neutro Abs 15.4 (*)    All other components within normal limits  URINALYSIS, COMPLETE (UACMP) WITH MICROSCOPIC -  Abnormal; Notable for the following:    Color, Urine YELLOW (*)    APPearance CLEAR (*)    Glucose, UA >=500 (*)    Hgb urine dipstick SMALL (*)  Ketones, ur 20 (*)    Squamous Epithelial / LPF 0-5 (*)    All other components within normal limits  CULTURE, BLOOD (ROUTINE X 2)  CULTURE, BLOOD (ROUTINE X 2)  LACTIC ACID, PLASMA   ____________________________________________  EKG  none  ____________________________________________  RADIOLOGY  XR L foot: No evidence of osteomyelitis by x-ray. Gas is present in the soft tissues anterior to the third through fifth amputated toes.  ____________________________________________   PROCEDURES  Procedure(s) performed: None Procedures Critical Care performed: yes  CRITICAL CARE Performed by: Rudene Re  ?  Total critical care time: 35 min  Critical care time was exclusive of separately billable procedures and treating other patients.  Critical care was necessary to treat or prevent imminent or life-threatening deterioration.  Critical care was time spent personally by me on the following activities: development of treatment plan with patient and/or surrogate as well as nursing, discussions with consultants, evaluation of patient's response to treatment, examination of patient, obtaining history from patient or surrogate, ordering and performing treatments and interventions, ordering and review of laboratory studies, ordering and review of radiographic studies, pulse oximetry and re-evaluation of patient's condition.  ____________________________________________   INITIAL IMPRESSION / ASSESSMENT AND PLAN / ED COURSE  82 y.o. male with a history of peripheral vascular disease who presents for L foot pain and infection. Patient with a fever at home. Afebrile here with normal vital signs. Patient with necrosis of the second left toe extending into the dorsal aspect of the foot, foul-smelling discharge, and streaking erythema  crossing the ankle joint. Labs showing lactic acid of 2.5, WBC 17.4K meeting sepsis criteria. XR with no evidence of osteomyelitis. Does show subcutaneous gas however patient has an open wound. K low which was supplemented PO and IV. Patient given IV vanc and zosyn and will be admitted to hospitalist service.     Pertinent labs & imaging results that were available during my care of the patient were reviewed by me and considered in my medical decision making (see chart for details).    ____________________________________________   FINAL CLINICAL IMPRESSION(S) / ED DIAGNOSES  Final diagnoses:  Sepsis, due to unspecified organism ([place])  Necrotic toes ([place])  Hypokalemia      NEW MEDICATIONS STARTED DURING THIS VISIT:  New Prescriptions   No medications on file     Note:  This document was prepared using Dragon voice recognition software and may include unintentional dictation errors.    Rudene Re, MD 12/29/16 2053

## 2016-12-29 NOTE — ED Notes (Signed)
Attempted to call report to [facility] and was advised that she needed to call me back in 10-15 min

## 2016-12-29 NOTE — ED Triage Notes (Signed)
Patient states he was called at the house and told to come to the ED right away.  Patient has history of DM.  Had three toes amputated and now wound is infected.  Scheduled to have foot amputated next Wednesday by Dr. Lucky Cowboy.

## 2016-12-29 NOTE — H&P (Signed)
[name] at [place] NAME: [name]    MR#:  [id number]  DATE OF BIRTH:  07-09-1934  DATE OF ADMISSION:  12/29/2016  PRIMARY CARE PHYSICIAN: Alanson Aly, FNP   REQUESTING/REFERRING PHYSICIAN: Alfred Levins, MD  CHIEF COMPLAINT:   Chief Complaint  Patient presents with  . Foot Pain    HISTORY OF PRESENT ILLNESS:  [name]  is a 82 y.o. male who presents with Left foot pain, black second toe and foot ulcer. Patient has a history of peripheral vascular disease, prior toe amputations on the left foot. He meets sepsis criteria. Hospitals were called for admission.  PAST MEDICAL HISTORY:   Past Medical History:  Diagnosis Date  . Arthritis   . Atrial fibrillation ([place])   . Carcinoma of prostate ([place])   . CHF (congestive heart failure) ([place])   . Chronic kidney disease   . Diabetes mellitus without complication ([place])   . ED (erectile dysfunction)   . Frequent urination   . Hyperlipidemia   . Hypertension   . Moderate mitral insufficiency   . Peripheral vascular disease ([place])   . Pneumonia 04/2016  . Prostate cancer ([place])   . Sleep apnea    OSA--USE C-PAP  . Ulcer of left foot due to type 2 diabetes mellitus ([place])   . Urinary stress incontinence, male     PAST SURGICAL HISTORY:   Past Surgical History:  Procedure Laterality Date  . APLIGRAFT PLACEMENT Left 10/15/2016   Procedure: APLIGRAFT PLACEMENT;  Surgeon: Albertine Patricia, DPM;  Location: [facility];  Service: Podiatry;  Laterality: Left;  necrotic ulcer  . CHOLECYSTECTOMY    . EYE SURGERY Bilateral    Cataract Extraction with IOL  . IRRIGATION AND DEBRIDEMENT FOOT Left 10/15/2016   Procedure: IRRIGATION AND DEBRIDEMENT FOOT-EXCISIONAL DEBRIDEMENT OF SKIN 3RD, 4TH AND 5TH TOES WITH APPLICATION OF APLIGRAFT;  Surgeon: Albertine Patricia, DPM;  Location: [facility];  Service: Podiatry;  Laterality: Left;  necrotic,gangrene  . IRRIGATION AND DEBRIDEMENT FOOT Left  12/09/2016   Procedure: IRRIGATION AND DEBRIDEMENT FOOT-MUSCLE/FASCIA, RESECTION OF FOURTH AND FIFTH METATARSAL NECROTIC BONE;  Surgeon: Albertine Patricia, DPM;  Location: [facility];  Service: Podiatry;  Laterality: Left;  . LOWER EXTREMITY ANGIOGRAPHY Left 08/16/2016   Procedure: Lower Extremity Angiography;  Surgeon: Algernon Huxley, MD;  Location: [place] [facility];  Service: Cardiovascular;  Laterality: Left;  . LOWER EXTREMITY ANGIOGRAPHY Left 09/01/2016   Procedure: Lower Extremity Angiography;  Surgeon: Algernon Huxley, MD;  Location: [place] [facility];  Service: Cardiovascular;  Laterality: Left;  . LOWER EXTREMITY ANGIOGRAPHY Left 10/14/2016   Procedure: Lower Extremity Angiography;  Surgeon: Algernon Huxley, MD;  Location: [place] [facility];  Service: Cardiovascular;  Laterality: Left;  . LOWER EXTREMITY ANGIOGRAPHY Left 12/20/2016   Procedure: Lower Extremity Angiography;  Surgeon: Algernon Huxley, MD;  Location: [place] [facility];  Service: Cardiovascular;  Laterality: Left;  . LOWER EXTREMITY INTERVENTION  09/01/2016   Procedure: Lower Extremity Intervention;  Surgeon: Algernon Huxley, MD;  Location: [employer] [facility];  Service: Cardiovascular;;  . PROSTATE SURGERY     removal  . TONSILLECTOMY     as a child    SOCIAL HISTORY:   Social History  Substance Use Topics  . Smoking status: Former Smoker    Packs/day: 0.25    Types: Cigarettes    Quit date: 10/14/1994  . Smokeless tobacco: Never Used  . Alcohol use No  FAMILY HISTORY:   Family History  Problem Relation Age of Onset  . Hypertension Mother   . Prostate cancer Neg Hx   . Bladder Cancer Neg Hx   . Kidney disease Neg Hx     DRUG ALLERGIES:   Allergies  Allergen Reactions  . Contrast Media [Iodinated Diagnostic Agents] Shortness Of Breath    SOB  . Iohexol Shortness Of Breath     Desc: Respiratory Distress, Laryngedema, diaphoresis, Onset Date: [id number]   . Latex Rash    MEDICATIONS AT HOME:   Prior to  Admission medications   Medication Sig Start Date End Date Taking? Authorizing Provider  acetaminophen (TYLENOL) 500 MG tablet Take 500 mg by mouth every 6 (six) hours as needed for moderate pain.   Yes [provider]  aspirin EC 81 MG tablet Take 1 tablet (81 mg total) by mouth daily. 08/16/16  Yes Dew, Erskine Squibb, MD  atorvastatin (LIPITOR) 80 MG tablet Take 80 mg by mouth daily at 6 PM.    Yes [provider]  diltiazem (CARDIZEM CD) 300 MG 24 hr capsule Take 300 mg by mouth daily.   Yes [provider]  furosemide (LASIX) 20 MG tablet TAKE 1 TABLET DAILY 10/24/15  Yes [provider]  gabapentin (NEURONTIN) 100 MG capsule Take 100 mg by mouth daily as needed (nerve pain).    Yes [provider]  HYDROcodone-acetaminophen (NORCO) 5-325 MG tablet Take 1 tablet by mouth every 6 (six) hours as needed for moderate pain. 12/20/16  Yes Dew, Erskine Squibb, MD  JARDIANCE 10 MG TABS tablet Take 10 mg by mouth daily. 11/30/16  Yes [provider]  metFORMIN (GLUCOPHAGE) 1000 MG tablet TAKE 1 TABLET TWICE A DAY WITH MEALS 11/28/15  Yes [provider]  TOPROL XL 100 MG 24 hr tablet Take 100 mg by mouth daily. 12/25/16  Yes [provider]  TRULICITY 2.02 RK/2.7CW SOPN 0.75 mg once a week. 11/14/16  Yes [provider]  warfarin (COUMADIN) 5 MG tablet Take 5 mg by mouth every evening.    Yes [provider]  insulin glargine (LANTUS) 100 UNIT/ML injection Inject 0.24 mLs (24 Units total) into the skin at bedtime. Patient not taking: Reported on 12/20/2016 09/06/16   Theodoro Grist, MD    REVIEW OF SYSTEMS:  Review of Systems  Constitutional: Negative for chills, fever, malaise/fatigue and weight loss.  HENT: Negative for ear pain, hearing loss and tinnitus.   Eyes: Negative for blurred vision, double vision, pain and redness.  Respiratory: Negative for cough, hemoptysis and shortness of breath.   Cardiovascular: Negative for chest  pain, palpitations, orthopnea and leg swelling.  Gastrointestinal: Negative for abdominal pain, constipation, diarrhea, nausea and vomiting.  Genitourinary: Negative for dysuria, frequency and hematuria.  Musculoskeletal: Positive for joint pain (Left foot). Negative for back pain and neck pain.  Skin:       Left foot ulcer  Neurological: Negative for dizziness, tremors, focal weakness and weakness.  Endo/Heme/Allergies: Negative for polydipsia. Does not bruise/bleed easily.  Psychiatric/Behavioral: Negative for depression. The patient is not nervous/anxious and does not have insomnia.      VITAL SIGNS:   Vitals:   12/29/16 1725 12/29/16 1726  BP:  118/84  Pulse:  96  Temp:  97.9 F (36.6 C)  TempSrc:  Oral  SpO2:  96%  Weight: 86.2 kg (190 lb)   Height: 5\' 7"  (1.702 m)    Wt Readings from Last 3 Encounters:  12/29/16 86.2 kg (  190 lb)  12/20/16 86.2 kg (190 lb)  12/09/16 86.2 kg (190 lb)    PHYSICAL EXAMINATION:  Physical Exam  Vitals reviewed. Constitutional: He is oriented to person, place, and time. He appears well-developed and well-nourished. No distress.  HENT:  Head: Normocephalic and atraumatic.  Mouth/Throat: Oropharynx is clear and moist.  Eyes: Pupils are equal, round, and reactive to light. Conjunctivae and EOM are normal. No scleral icterus.  Neck: Normal range of motion. Neck supple. No JVD present. No thyromegaly present.  Cardiovascular: Regular rhythm and intact distal pulses.  Exam reveals no gallop and no friction rub.   No murmur heard. Tachycardic  Respiratory: Effort normal and breath sounds normal. No respiratory distress. He has no wheezes. He has no rales.  GI: Soft. Bowel sounds are normal. He exhibits no distension. There is no tenderness.  Musculoskeletal: Normal range of motion. He exhibits no edema.  No arthritis, no gout  Lymphadenopathy:    He has no cervical adenopathy.  Neurological: He is alert and oriented to person, place, and time.  No cranial nerve deficit.  No dysarthria, no aphasia  Skin: Skin is warm and dry. No rash noted. No erythema.  Left foot ulcer and black second toe.  Psychiatric: He has a normal mood and affect. His behavior is normal. Judgment and thought content normal.    LABORATORY PANEL:   CBC  Recent Labs Lab 12/29/16 1729  WBC 17.4*  HGB 13.0  HCT 39.1*  PLT 348   ------------------------------------------------------------------------------------------------------------------  Chemistries   Recent Labs Lab 12/29/16 1729  NA 136  K 2.9*  CL 100*  CO2 21*  GLUCOSE 239*  BUN 21*  CREATININE 1.07  CALCIUM 8.6*  AST 24  ALT 16*  ALKPHOS 152*  BILITOT 1.3*   ------------------------------------------------------------------------------------------------------------------  Cardiac Enzymes No results for input(s): TROPONINI in the last 168 hours. ------------------------------------------------------------------------------------------------------------------  RADIOLOGY:  Dg Chest 2 View  Result Date: 12/29/2016 CLINICAL DATA:  Diabetic foot wound. EXAM: CHEST  2 VIEW COMPARISON:  06/03/2016 FINDINGS: Stable top-normal heart size an thoracic aortic atherosclerosis. Opacity at the left lung base is favored to represent atelectasis. Early pneumonia cannot be excluded. There does appear to be a small amount of pleural fluid bilaterally. No overt edema. No pneumothorax or nodule identified. The visualized skeletal structures are unremarkable. IMPRESSION: Left basilar opacity favored to represent atelectasis. Early pneumonia is not excluded. Small pleural effusions are present bilaterally. Electronically Signed   By: Aletta Edouard M.D.   On: 12/29/2016 17:57   Dg Foot Complete Left  Result Date: 12/29/2016 CLINICAL DATA:  Infected left foot wound with prior toe amputation. EXAM: LEFT FOOT - COMPLETE 3+ VIEW COMPARISON:  10/21/2016 FINDINGS: Since the prior radiograph, there has been  further amputation of fourth and fifth metatarsals at the level of their mid shafts. There is some visible gas in the soft tissues anterior to the amputated third, fourth and fifth toes which may be on the basis of wound infection. No obvious bony destruction to suggest osteomyelitis. Vascular calcifications of digital arteries. IMPRESSION: No evidence of osteomyelitis by x-ray. Gas is present in the soft tissues anterior to the third through fifth amputated toes. Electronically Signed   By: Aletta Edouard M.D.   On: 12/29/2016 20:40    EKG:   Orders placed or performed during the hospital encounter of 10/12/16  . EKG 12-Lead  . EKG 12-Lead    IMPRESSION AND PLAN:  Principal Problem:   Sepsis ([place]) - IV antibiotics and place,  culture sent from the ED, blood pressure stable, lactic acid elevated, IV fluids in Place and trend lactate and 2 within normal limits Active Problems:   PAD (peripheral artery disease) ([place]) - vascular surgery consulted, likely plan is for amputation   Diabetic foot ulcer ([facility]) - IV antibiotics as above   Type 2 diabetes mellitus treated with insulin ([facility]) - sliding scale insulin with corresponding glucose checks   Essential hypertension - continue home meds   A-fib ([place]) - home meds  All the records are reviewed and case discussed with ED provider. Management plans discussed with the patient and/or family.  DVT PROPHYLAXIS: Systemic anticoagulation  GI PROPHYLAXIS: PPI  ADMISSION STATUS: Inpatient  CODE STATUS: Full Code Status History    Date Active Date Inactive Code Status Order ID Comments User Context   12/20/2016 12:51 PM 12/20/2016  6:42 PM Full Code [id number]  Algernon Huxley, MD Inpatient   10/22/2016 12:40 AM 10/24/2016  8:58 PM Full Code [id number]  Lance Coon, MD Inpatient   10/14/2016 12:01 PM 10/14/2016  7:25 PM Full Code [id number]  Algernon Huxley, MD Inpatient   08/30/2016  1:38 PM 09/06/2016  7:41 PM Full Code [id number]  Epifanio Lesches, MD Inpatient    05/19/2016  4:54 AM 05/22/2016  7:07 PM Full Code [id number]  Saundra Shelling, MD Inpatient      TOTAL TIME TAKING CARE OF THIS PATIENT: 45 minutes.   Latanja Lehenbauer FIELDING 12/29/2016, 8:54 PM  [facility]  Office  [phone]  CC: Primary care physician; Alanson Aly, FNP  Note:  This document was prepared using Dragon voice recognition software and may include unintentional dictation errors.

## 2016-12-29 NOTE — Progress Notes (Signed)
ANTICOAGULATION CONSULT NOTE - Initial Consult  Pharmacy Consult for heparin drip Indication: atrial fibrillation  Allergies  Allergen Reactions  . Contrast Media [Iodinated Diagnostic Agents] Shortness Of Breath    SOB  . Iohexol Shortness Of Breath     Desc: Respiratory Distress, Laryngedema, diaphoresis, Onset Date: [id number]   . Latex Rash    Patient Measurements: Height: 5\' 7"  (170.2 cm) Weight: 190 lb (86.2 kg) IBW/kg (Calculated) : 66.1 Heparin Dosing Weight: 84kg  Vital Signs: Temp: 97.9 F (36.6 C) (08/08 2244) Temp Source: Oral (08/08 2244) BP: 149/73 (08/08 2244) Pulse Rate: 54 (08/08 2244)  Labs:  Recent Labs  12/29/16 1729 12/29/16 2151  HGB 13.0  --   HCT 39.1*  --   PLT 348  --   LABPROT  --  44.7*  INR  --  4.59*  CREATININE 1.07  --     Estimated Creatinine Clearance: 58.7 mL/min (by C-G formula based on SCr of 1.07 mg/dL).   Medical History: Past Medical History:  Diagnosis Date  . Arthritis   . Atrial fibrillation ([place])   . Carcinoma of prostate ([place])   . CHF (congestive heart failure) ([place])   . Chronic kidney disease   . Diabetes mellitus without complication ([place])   . ED (erectile dysfunction)   . Frequent urination   . Hyperlipidemia   . Hypertension   . Moderate mitral insufficiency   . Peripheral vascular disease ([place])   . Pneumonia 04/2016  . Prostate cancer ([place])   . Sleep apnea    OSA--USE C-PAP  . Ulcer of left foot due to type 2 diabetes mellitus ([place])   . Urinary stress incontinence, male     Medications:    Assessment: INR supratherapeutic on admission  Goal of Therapy:  Heparin level 0.3-0.7 units/ml Monitor platelets by anticoagulation protocol: Yes   Plan:  Patient will possibly need amputation at some point during admission. Plan per hospitalist was to bridge with heparin until procedure. For now will hold heparin drip and trend INR. Start heparin drip when INR <2 or as determined by  surgery.  Geonna Lockyer S 12/29/2016,11:44 PM

## 2016-12-30 DIAGNOSIS — I96 Gangrene, not elsewhere classified: Secondary | ICD-10-CM

## 2016-12-30 DIAGNOSIS — E119 Type 2 diabetes mellitus without complications: Secondary | ICD-10-CM | POA: Diagnosis not present

## 2016-12-30 DIAGNOSIS — M79605 Pain in left leg: Secondary | ICD-10-CM | POA: Diagnosis not present

## 2016-12-30 DIAGNOSIS — I4891 Unspecified atrial fibrillation: Secondary | ICD-10-CM

## 2016-12-30 LAB — SURGICAL PCR SCREEN
MRSA, PCR: NEGATIVE
Staphylococcus aureus: NEGATIVE

## 2016-12-30 LAB — CBC
HCT: 33.8 % — ABNORMAL LOW (ref 40.0–52.0)
Hemoglobin: 11.3 g/dL — ABNORMAL LOW (ref 13.0–18.0)
MCH: 28.1 pg (ref 26.0–34.0)
MCHC: 33.4 g/dL (ref 32.0–36.0)
MCV: 84.2 fL (ref 80.0–100.0)
PLATELETS: 292 10*3/uL (ref 150–440)
RBC: 4.01 MIL/uL — ABNORMAL LOW (ref 4.40–5.90)
RDW: 16.3 % — AB (ref 11.5–14.5)
WBC: 14.2 10*3/uL — ABNORMAL HIGH (ref 3.8–10.6)

## 2016-12-30 LAB — HEMOGLOBIN A1C
HEMOGLOBIN A1C: 7.4 % — AB (ref 4.8–5.6)
Mean Plasma Glucose: 165.68 mg/dL

## 2016-12-30 LAB — BASIC METABOLIC PANEL
Anion gap: 9 (ref 5–15)
BUN: 20 mg/dL (ref 6–20)
CALCIUM: 7.8 mg/dL — AB (ref 8.9–10.3)
CHLORIDE: 104 mmol/L (ref 101–111)
CO2: 23 mmol/L (ref 22–32)
CREATININE: 0.97 mg/dL (ref 0.61–1.24)
GFR calc Af Amer: >60 mL/min (ref >60)
GFR calc non Af Amer: >60 mL/min (ref >60)
Glucose, Bld: 245 mg/dL — ABNORMAL HIGH (ref 65–99)
Potassium: 3.2 mmol/L — ABNORMAL LOW (ref 3.5–5.1)
Sodium: 136 mmol/L (ref 135–145)

## 2016-12-30 LAB — PROTIME-INR
INR: 5.6
Prothrombin Time: 52.4 seconds — ABNORMAL HIGH (ref 11.4–15.2)

## 2016-12-30 LAB — LACTIC ACID, PLASMA: LACTIC ACID, VENOUS: 2.7 mmol/L — AB (ref 0.5–1.9)

## 2016-12-30 LAB — GLUCOSE, CAPILLARY
GLUCOSE-CAPILLARY: 174 mg/dL — AB (ref 65–99)
Glucose-Capillary: 158 mg/dL — ABNORMAL HIGH (ref 65–99)

## 2016-12-30 LAB — APTT: APTT: 65 s — AB (ref 24–36)

## 2016-12-30 MED ORDER — POTASSIUM CHLORIDE CRYS ER 20 MEQ PO TBCR
40.0000 meq | EXTENDED_RELEASE_TABLET | Freq: Once | ORAL | Status: AC
  Administered 2016-12-30: 40 meq via ORAL
  Filled 2016-12-30: qty 2

## 2016-12-30 MED ORDER — AMOXICILLIN-POT CLAVULANATE 875-125 MG PO TABS: 1.0000 | ORAL_TABLET | Freq: Two times a day (BID) | ORAL | 0 refills | Status: AC

## 2016-12-30 MED ORDER — SODIUM CHLORIDE 0.9 % IV BOLUS (SEPSIS)
1000.0000 mL | Freq: Once | INTRAVENOUS | Status: AC
  Administered 2016-12-30: 1000 mL via INTRAVENOUS

## 2016-12-30 NOTE — Progress Notes (Signed)
Patient alert and oriented, vss, having minimal pain.  No questions.   Will continue antibiotics at home.  Family at bedside.   Changed dressing on left foot.  No drainage.  Foot is very necrotic and malodorous.    Surgery planned for next week.    Patient escorted out of hospital via wheelchair by nursing staff.

## 2016-12-30 NOTE — Consult Note (Signed)
[facility] Vascular Consult Note  MRN : [id number]  [name] is a 82 y.o. (08-Jul-1934) male who presents with chief complaint of  Chief Complaint  Patient presents with  . Foot Pain  .  History of Present Illness: I am asked by Dr. Manuella Ghazi to see the patient regarding his left foot wound.  He has had a chronic, persistently painful left foot and has had multiple previous revascularizations the most recent was less than 2 weeks ago. At that time I discussed with he and his wife that a left below the knee amputation would be his best option for removal of the infection and removal of the source of pain. He and his wife went on to consider their options were trying to get all the family's input. I discussed the case with his podiatrist Dr. Elvina Mattes and also told him that I recommended a left below the knee amputation as I am not optimistic that any procedure within the foot would heal and I think he will have persistent and ongoing pain. He is admitted on this episode for continued left foot pain which is not surprising. He has a chronic infection but met the criteria for sepsis and was admitted to the hospital. We are consult for further evaluation and treatment.  Current Facility-Administered Medications  Medication Dose Route Frequency Provider Last Rate Last Dose  . acetaminophen (TYLENOL) tablet 650 mg  650 mg Oral Q6H PRN Lance Coon, MD       Or  . acetaminophen (TYLENOL) suppository 650 mg  650 mg Rectal Q6H PRN Lance Coon, MD      . aspirin EC tablet 81 mg  81 mg Oral Daily Lance Coon, MD      . atorvastatin (LIPITOR) tablet 80 mg  80 mg Oral q1800 Lance Coon, MD      . diltiazem (CARDIZEM CD) 24 hr capsule 300 mg  300 mg Oral Daily Lance Coon, MD   300 mg at 12/30/16 0853  . gabapentin (NEURONTIN) capsule 100 mg  100 mg Oral Daily PRN Lance Coon, MD      . HYDROcodone-acetaminophen (NORCO/VICODIN) 5-325 MG per tablet 1 tablet  1 tablet Oral  Q6H PRN Lance Coon, MD   1 tablet at 12/30/16 [phone]  . insulin aspart (novoLOG) injection 0-9 Units  0-9 Units Subcutaneous TID AC & HS Lance Coon, MD      . metoprolol succinate (TOPROL-XL) 24 hr tablet 100 mg  100 mg Oral Daily Lance Coon, MD   100 mg at 12/30/16 1829  . ondansetron (ZOFRAN) tablet 4 mg  4 mg Oral Q6H PRN Lance Coon, MD       Or  . ondansetron [facility]) injection 4 mg  4 mg Intravenous Q6H PRN Lance Coon, MD      . piperacillin-tazobactam (ZOSYN) IVPB 3.375 g  3.375 g Intravenous Driscilla Moats, MD   Stopped at 12/30/16 [phone]  . vancomycin (VANCOCIN) 1,250 mg in sodium chloride 0.9 % 250 mL IVPB  1,250 mg Intravenous Q18H Lance Coon, MD   Stopped at 12/30/16 0430    Past Medical History:  Diagnosis Date  . Arthritis   . Atrial fibrillation ([place])   . Carcinoma of prostate ([place])   . CHF (congestive heart failure) ([place])   . Chronic kidney disease   . Diabetes mellitus without complication ([place])   . ED (erectile dysfunction)   . Frequent urination   . Hyperlipidemia   . Hypertension   . Moderate  mitral insufficiency   . Peripheral vascular disease ([place])   . Pneumonia 04/2016  . Prostate cancer ([place])   . Sleep apnea    OSA--USE C-PAP  . Ulcer of left foot due to type 2 diabetes mellitus ([place])   . Urinary stress incontinence, male     Past Surgical History:  Procedure Laterality Date  . APLIGRAFT PLACEMENT Left 10/15/2016   Procedure: APLIGRAFT PLACEMENT;  Surgeon: Albertine Patricia, DPM;  Location: [facility] ORS;  Service: Podiatry;  Laterality: Left;  necrotic ulcer  . CHOLECYSTECTOMY    . EYE SURGERY Bilateral    Cataract Extraction with IOL  . IRRIGATION AND DEBRIDEMENT FOOT Left 10/15/2016   Procedure: IRRIGATION AND DEBRIDEMENT FOOT-EXCISIONAL DEBRIDEMENT OF SKIN 3RD, 4TH AND 5TH TOES WITH APPLICATION OF APLIGRAFT;  Surgeon: Albertine Patricia, DPM;  Location: [facility] ORS;  Service: Podiatry;  Laterality: Left;  necrotic,gangrene  . IRRIGATION AND  DEBRIDEMENT FOOT Left 12/09/2016   Procedure: IRRIGATION AND DEBRIDEMENT FOOT-MUSCLE/FASCIA, RESECTION OF FOURTH AND FIFTH METATARSAL NECROTIC BONE;  Surgeon: Albertine Patricia, DPM;  Location: [facility] ORS;  Service: Podiatry;  Laterality: Left;  . LOWER EXTREMITY ANGIOGRAPHY Left 08/16/2016   Procedure: Lower Extremity Angiography;  Surgeon: Algernon Huxley, MD;  Location: [place] CV LAB;  Service: Cardiovascular;  Laterality: Left;  . LOWER EXTREMITY ANGIOGRAPHY Left 09/01/2016   Procedure: Lower Extremity Angiography;  Surgeon: Algernon Huxley, MD;  Location: [place] [facility];  Service: Cardiovascular;  Laterality: Left;  . LOWER EXTREMITY ANGIOGRAPHY Left 10/14/2016   Procedure: Lower Extremity Angiography;  Surgeon: Algernon Huxley, MD;  Location: [place] [facility];  Service: Cardiovascular;  Laterality: Left;  . LOWER EXTREMITY ANGIOGRAPHY Left 12/20/2016   Procedure: Lower Extremity Angiography;  Surgeon: Algernon Huxley, MD;  Location: [place] CV LAB;  Service: Cardiovascular;  Laterality: Left;  . LOWER EXTREMITY INTERVENTION  09/01/2016   Procedure: Lower Extremity Intervention;  Surgeon: Algernon Huxley, MD;  Location: [place] [facility];  Service: Cardiovascular;;  . PROSTATE SURGERY     removal  . TONSILLECTOMY     as a child    Social History Social History  Substance Use Topics  . Smoking status: Former Smoker    Packs/day: 0.25    Types: Cigarettes    Quit date: 10/14/1994  . Smokeless tobacco: Never Used  . Alcohol use No  married  Family History Family History  Problem Relation Age of Onset  . Hypertension Mother   . Prostate cancer Neg Hx   . Bladder Cancer Neg Hx   . Kidney disease Neg Hx   no bleeding or clotting disorders  Allergies  Allergen Reactions  . Contrast Media [Iodinated Diagnostic Agents] Shortness Of Breath    SOB  . Iohexol Shortness Of Breath     Desc: Respiratory Distress, Laryngedema, diaphoresis, Onset Date: [id number]   . Latex Rash      REVIEW OF SYSTEMS (Negative unless checked)  Constitutional: _0 Weight loss  _1 Fever  _2 Chills Cardiac: _3 Chest pain   _4 Chest pressure   _5 Palpitations   _6 Shortness of breath when laying flat   _7 Shortness of breath at rest   _8 Shortness of breath with exertion. Vascular:  _9 Pain in legs with walking   _10 Pain in legs at rest   _11 Pain in legs when laying flat   _12 Claudication   _13 Pain in feet when walking  _14 Pain in feet at rest  _15 Pain in feet when laying flat   _16 History of DVT   _17 Phlebitis   _18 Swelling in legs   _19   Varicose veins   _0 Non-healing ulcers Pulmonary:   _1 Uses home oxygen   _2 Productive cough   _3 Hemoptysis   _4 Wheeze  _5 COPD   _6 Asthma Neurologic:  _7 Dizziness  _8 Blackouts   _9 Seizures   _10 History of stroke   _11 History of TIA  _12 Aphasia   _13 Temporary blindness   _14 Dysphagia   _15 Weakness or numbness in arms   _16 Weakness or numbness in legs Musculoskeletal:  _17 Arthritis   _18 Joint swelling   _19 Joint pain   _20 Low back pain Hematologic:  _21 Easy bruising  _22 Easy bleeding   _23 Hypercoagulable state   _24 Anemic  _25 Hepatitis Gastrointestinal:  _26 Blood in stool   _27 Vomiting blood  _28 Gastroesophageal reflux/heartburn   _29 Difficulty swallowing. Genitourinary:  _30 Chronic kidney disease   _31 Difficult urination  _32 Frequent urination  _33 Burning with urination   _34 Blood in urine Skin:  _35 Rashes   _36 Ulcers   _37 Wounds Psychological:  _38 History of anxiety   _39  History of major depression.  Physical Examination  Vitals:   12/29/16 2244 12/30/16 0001 12/30/16 0402 12/30/16 0612  BP: (!) 149/73 (!) 139/96 (!) 149/100 (!) 155/80  Pulse: (!) 54 (!) 135 (!) 123 (!) 130  Resp: _40 Temp: 97.9 F (36.6 C)     TempSrc: Oral     SpO2: 94% 93% 97%   Weight:      Height:       Body mass index is 29.76 kg/m. Gen:  WD/WN, NAD Head: [place]/AT, No temporalis wasting.  Ear/Nose/Throat: Hearing grossly intact, nares w/o erythema or drainage, oropharynx w/o Erythema/Exudate Eyes: Sclera  non-icteric, conjunctiva clear Neck: Trachea midline.  No JVD.  Pulmonary:  Good air movement, respirations not labored, equal bilaterally.  Cardiac: irregular and tachycardic Vascular:  Vessel Right Left  Radial Palpable Palpable                      Popliteal Not Palpable Not Palpable  PT 1+ Palpable Trace Palpable  DP Not Palpable Not Palpable    Musculoskeletal: M/S 5/5 throughout.  Left foot with chronic draining wound which is unchanged from the last several weeks. Second toe is now gangrenous. First toe has Refill which is present. Neurologic: Sensation grossly intact in extremities.  Symmetrical.  Speech is fluent. Motor exam as listed above. Psychiatric: Judgment and insight appear fair. Dermatologic: open wound and gangrenous changes on the left foot as described above      CBC Lab Results  Component Value Date   WBC 14.2 (H) 12/30/2016   HGB 11.3 (L) 12/30/2016   HCT 33.8 (L) 12/30/2016   MCV 84.2 12/30/2016   PLT 292 12/30/2016    BMET    Component Value Date/Time   NA 136 12/30/2016 0207   NA 140 01/20/2013 0255   K 3.2 (L) 12/30/2016 0207   K 3.5 01/20/2013 0255   CL 104 12/30/2016 0207   CL 103 01/20/2013 0255   CO2 23 12/30/2016 0207   CO2 30 01/20/2013 0255   GLUCOSE 245 (H) 12/30/2016 0207   GLUCOSE 145 (H) 01/20/2013 0255   BUN 20 12/30/2016 0207   BUN 22 (H) 01/20/2013 0255   CREATININE 0.97 12/30/2016 0207   CREATININE 1.09 01/20/2013 0255   CALCIUM 7.8 (L) 12/30/2016 0207   CALCIUM 9.4 01/20/2013 0255   GFRNONAA >60 12/30/2016 0207   GFRNONAA >60 01/20/2013 0255   GFRAA >60 12/30/2016 0207   GFRAA >60 01/20/2013 0255   Estimated Creatinine Clearance: 64.7 mL/min (by C-G formula based on SCr of 0.97 mg/dL).  COAG  Lab Results  Component Value Date   INR 5.60 (HH) 12/30/2016   INR 4.59 (HH) 12/29/2016   INR 2.57 12/20/2016    Radiology Dg Chest 2 View  Result Date: 12/29/2016 CLINICAL DATA:  Diabetic foot wound. EXAM: CHEST  2  VIEW COMPARISON:  06/03/2016 FINDINGS: Stable top-normal heart size an thoracic aortic atherosclerosis. Opacity at the left lung base is favored to represent atelectasis. Early pneumonia cannot be excluded. There does appear to be a small amount of pleural fluid bilaterally. No overt edema. No pneumothorax or nodule identified. The visualized skeletal structures are unremarkable. IMPRESSION: Left basilar opacity favored to represent atelectasis. Early pneumonia is not excluded. Small pleural effusions are present bilaterally. Electronically Signed   By: Aletta Edouard M.D.   On: 12/29/2016 17:57   Dg Foot Complete Left  Result Date: 12/29/2016 CLINICAL DATA:  Infected left foot wound with prior toe amputation. EXAM: LEFT FOOT - COMPLETE 3+ VIEW COMPARISON:  10/21/2016 FINDINGS: Since the prior radiograph, there has been further amputation of fourth and fifth metatarsals at the level of their mid shafts. There is some visible gas in the soft tissues anterior to the amputated third, fourth and fifth toes which may be on the basis of wound infection. No obvious bony destruction to suggest osteomyelitis. Vascular calcifications of digital arteries. IMPRESSION: No evidence of osteomyelitis by x-ray. Gas is present in the soft tissues anterior to the third through fifth amputated toes. Electronically Signed   By: Aletta Edouard M.D.   On: 12/29/2016 20:40      Assessment/Plan 1. PAD with gangrene and infected ulceration of the left foot. I continue to recommend an amputation. The patient does not want to have this done at this time and wants to get all of his family together. It is certainly reasonable as he does not appear toxic today. I would recommend this be done in the next couple of weeks as he will continue to have infectious problems and continued pain until he has his amputation. This has been previously recommended to him and I will continue to recommend that I do not believe limb salvage is an option  at this point 2. Atrial fibrillation. Poorly controlled. Has been a recurring problem. Has seen cardiology in the past. Would need to have this optimized before any surgery. 3. Diabetes. Control is important for wound healing and avoidance of progression of atherosclerosis.   Leotis Pain, MD  12/30/2016 10:15 AM    This note was created with Dragon medical transcription system.  Any error is purely unintentional

## 2016-12-30 NOTE — Care Management CC44 (Signed)
Condition Code 44 Documentation Completed  Patient Details  Name: [name] MRN: [id number] Date of Birth: Apr 06, 1935   Condition Code 44 given:  Yes Patient signature on Condition Code 44 notice:  Yes Documentation of 2 MD's agreement:  Yes Code 44 added to claim:  Yes    Jolly Mango, RN 12/30/2016, 12:24 PM

## 2016-12-30 NOTE — Discharge Instructions (Signed)

## 2016-12-30 NOTE — Care Management Note (Signed)
Case Management Note  Patient Details  Name: [name] MRN: [id number] Date of Birth: February 01, 1935  Subjective/Objective:  RNCM assessment for discharge planning. Patient presents from home where he lives with his wife. PCP is Alanson Aly.  Has a rolling walker. Patient states he has home health in the home. Doesn't know the name. TC to wife, [name], she can not recall the name but states they are providing nursing for dressing changes, PT and HHA 3 x per week.                  Action/Plan: Will follow progression.   Expected Discharge Date:  01/01/17               Expected Discharge Plan:  [place]  In-House Referral:     Discharge planning Services  CM Consult  Post Acute Care Choice:  Home Health, Resumption of Svcs/PTA Provider Choice offered to:     DME Arranged:    DME Agency:     HH Arranged:  RN, PT, Nurse's Aide [place] Agency:     Status of Service:  In process, will continue to follow  If discussed at Long Length of Stay Meetings, dates discussed:    Additional Comments:  Jolly Mango, RN 12/30/2016, 8:36 AM

## 2016-12-30 NOTE — Progress Notes (Signed)
ANTICOAGULATION CONSULT NOTE - FOLLOW UP  Consult  Pharmacy Consult for heparin drip Indication: atrial fibrillation  Allergies  Allergen Reactions  . Contrast Media [Iodinated Diagnostic Agents] Shortness Of Breath    SOB  . Iohexol Shortness Of Breath     Desc: Respiratory Distress, Laryngedema, diaphoresis, Onset Date: [id number]   . Latex Rash    Patient Measurements: Height: 5\' 7"  (170.2 cm) Weight: 190 lb (86.2 kg) IBW/kg (Calculated) : 66.1 Heparin Dosing Weight: 84kg  Vital Signs: Temp: 97.9 F (36.6 C) (08/08 2244) Temp Source: Oral (08/08 2244) BP: 155/80 (08/09 0612) Pulse Rate: 130 (08/09 0612)  Labs:  Recent Labs  12/29/16 1729 12/29/16 2151 12/30/16 0207  HGB 13.0  --  11.3*  HCT 39.1*  --  33.8*  PLT 348  --  292  APTT  --   --  65*  LABPROT  --  44.7* 52.4*  INR  --  4.59* 5.60*  CREATININE 1.07  --  0.97    Estimated Creatinine Clearance: 64.7 mL/min (by C-G formula based on SCr of 0.97 mg/dL).   Medical History: Past Medical History:  Diagnosis Date  . Arthritis   . Atrial fibrillation ([place])   . Carcinoma of prostate ([place])   . CHF (congestive heart failure) ([place])   . Chronic kidney disease   . Diabetes mellitus without complication ([place])   . ED (erectile dysfunction)   . Frequent urination   . Hyperlipidemia   . Hypertension   . Moderate mitral insufficiency   . Peripheral vascular disease ([place])   . Pneumonia 04/2016  . Prostate cancer ([place])   . Sleep apnea    OSA--USE C-PAP  . Ulcer of left foot due to type 2 diabetes mellitus ([place])   . Urinary stress incontinence, male     Medications:    Assessment: INR supratherapeutic on admission  Goal of Therapy:  Heparin level 0.3-0.7 units/ml Monitor platelets by anticoagulation protocol: Yes   Plan:  Patient will possibly need amputation at some point during admission. Plan per hospitalist was to bridge with heparin until procedure. For now will hold heparin drip and trend INR.  Start heparin drip when INR <2 or as determined by surgery.Currently INR is >5. Will continue follow INR.   [name] 12/30/2016,9:38 AM

## 2016-12-30 NOTE — Care Management Obs Status (Signed)
[place] NOTIFICATION   Patient Details  Name: [name] MRN: [id number] Date of Birth: Jan 28, 1935   Medicare Observation Status Notification Given:  Yes    Jolly Mango, RN 12/30/2016, 12:23 PM

## 2017-01-03 LAB — CULTURE, BLOOD (ROUTINE X 2)
Culture: NO GROWTH
Culture: NO GROWTH

## 2017-01-03 NOTE — Discharge Summary (Addendum)
[facility] NAME: [name]    MR#:  [id number]  [place]:  1935/03/04  DATE OF ADMISSION:  12/29/2016   ADMITTING PHYSICIAN: Lance Coon, MD  DATE OF DISCHARGE: 12/30/2016  1:28 PM  PRIMARY CARE PHYSICIAN: Alanson Aly, FNP   ADMISSION DIAGNOSIS:  Hypokalemia [E87.6] Necrotic toes ([place]) [I96] Sepsis, due to unspecified organism ([place]) [A41.9] DISCHARGE DIAGNOSIS:  Principal Problem:   Sepsis ([place]) Active Problems:   Type 2 diabetes mellitus treated with insulin ([place])   Essential hypertension   PAD (peripheral artery disease) ([place])   A-fib (HCC)   Diabetic foot ulcer ([place])  SECONDARY DIAGNOSIS:   Past Medical History:  Diagnosis Date  . Arthritis   . Atrial fibrillation ([place])   . Carcinoma of prostate ([place])   . CHF (congestive heart failure) ([place])   . Chronic kidney disease   . Diabetes mellitus without complication ([place])   . ED (erectile dysfunction)   . Frequent urination   . Hyperlipidemia   . Hypertension   . Moderate mitral insufficiency   . Peripheral vascular disease ([place])   . Pneumonia 04/2016  . Prostate cancer ([place])   . Sleep apnea    OSA--USE C-PAP  . Ulcer of left foot due to type 2 diabetes mellitus ([place])   . Urinary stress incontinence, male    HOSPITAL COURSE:  82 y.o. male admitted with Left foot pain, black second toe and foot ulcer.  * Sepsis: present on admission due to foot infection. - Improved with IV Abx, Lactate going down, and improved BP, no fever.  * PAD with gangrene and infected ulceration of the left foot.  - Per vascular surgery - this is a chronic, persistently painful left foot and has had multiple previous revascularizations the most recent was less than 2 weeks ago. At that time they've discussed with patient and his wife that a left below the knee amputation would be his best option for removal of the infection and removal of the source of pain. He and his  wife went on to consider their options were trying to get all the family's input.  -Vascular sugrery continues to recommend an amputation. Per vascular, Patient does not want to have this done at this time and wants to get all of his family together. As patient does not appear toxic today and vascular surgeon recommending this be done in the next couple of weeks I offered him and his wife option to go home which they were wanting to as they already have an appt with vascular surgery coming Tuesday. - Being D/C on Augmentin till reeval appt with Dr Lucky Cowboy on 8/14  - Please note that Patient wasn't happy with not getting his surgery done on this admission.  * Atrial fibrillation. Poorly controlled. Has been a recurring problem. Has seen cardiology in the past.  - ON Cardizem and Toprol-XL for rate control - Hold warfarin till over the weekend and resume it on 8/13. Have PT-INR check on 8/13 or 8/14 with results to PCP, Cardio and Dr Bunnie Domino office for coumadin management. - Would need to have this optimized before any surgery. Follow up with Cardio in 1-2 weeks   * Diabetes. Control is important for wound healing and avoidance of progression of atherosclerosis. HbA1c 7.4  Please note, I offered patient to stay over to continue manage his diabetes, A.fib and treatment of Infection. He was not willing to stay as he was upset  hearing about vascular surgery not getting surgery done while here (that's what he came to the Hospital for). DISCHARGE CONDITIONS:  fair CONSULTS OBTAINED:  Treatment Team:  Leonel Ramsay, MD DRUG ALLERGIES:   Allergies  Allergen Reactions  . Contrast Media [Iodinated Diagnostic Agents] Shortness Of Breath    SOB  . Iohexol Shortness Of Breath     Desc: Respiratory Distress, Laryngedema, diaphoresis, Onset Date: [id number]   . Latex Rash   DISCHARGE MEDICATIONS:   Allergies as of 12/30/2016      Reactions   Contrast Media [iodinated Diagnostic Agents] Shortness Of  Breath   SOB   Iohexol Shortness Of Breath    Desc: Respiratory Distress, Laryngedema, diaphoresis, Onset Date: [id number]   Latex Rash      Medication List    STOP taking these medications   warfarin 5 MG tablet Commonly known as:  COUMADIN     TAKE these medications   acetaminophen 500 MG tablet Commonly known as:  TYLENOL Take 500 mg by mouth every 6 (six) hours as needed for moderate pain.   amoxicillin-clavulanate 875-125 MG tablet Commonly known as:  AUGMENTIN Take 1 tablet by mouth every 12 (twelve) hours.   aspirin EC 81 MG tablet Take 1 tablet (81 mg total) by mouth daily.   atorvastatin 80 MG tablet Commonly known as:  LIPITOR Take 80 mg by mouth daily at 6 PM.   diltiazem 300 MG 24 hr capsule Commonly known as:  CARDIZEM CD Take 300 mg by mouth daily.   furosemide 20 MG tablet Commonly known as:  LASIX TAKE 1 TABLET DAILY   gabapentin 100 MG capsule Commonly known as:  NEURONTIN Take 100 mg by mouth daily as needed (nerve pain).   HYDROcodone-acetaminophen 5-325 MG tablet Commonly known as:  NORCO Take 1 tablet by mouth every 6 (six) hours as needed for moderate pain.   insulin glargine 100 UNIT/ML injection Commonly known as:  LANTUS Inject 0.24 mLs (24 Units total) into the skin at bedtime.   JARDIANCE 10 MG Tabs tablet Generic drug:  empagliflozin Take 10 mg by mouth daily.   metFORMIN 1000 MG tablet Commonly known as:  GLUCOPHAGE TAKE 1 TABLET TWICE A DAY WITH MEALS   TOPROL XL 100 MG 24 hr tablet Generic drug:  metoprolol succinate Take 100 mg by mouth daily.   TRULICITY 9.37 JI/9.6VE Sopn Generic drug:  Dulaglutide 0.75 mg once a week.        DISCHARGE INSTRUCTIONS:  Hold warfarin till over the weekend and resume it on 8/13. Have PT-INR check on 8/13 or 8/14 with results to PCP, Cardio and Dr Bunnie Domino office for coumadin management. DIET:  Diabetic diet DISCHARGE CONDITION:  Fair ACTIVITY:  Activity as tolerated OXYGEN:  Home  Oxygen: No.  Oxygen Delivery: room air DISCHARGE LOCATION:  home   If you experience worsening of your admission symptoms, develop shortness of breath, life threatening emergency, suicidal or homicidal thoughts you must seek medical attention immediately by calling 911 or calling your MD immediately  if symptoms less severe.  You Must read complete instructions/literature along with all the possible adverse reactions/side effects for all the Medicines you take and that have been prescribed to you. Take any new Medicines after you have completely understood and accpet all the possible adverse reactions/side effects.   Please note  You were cared for by a hospitalist during your hospital stay. If you have any questions about your discharge medications or the care you received while you  were in the hospital after you are discharged, you can call the unit and asked to speak with the hospitalist on call if the hospitalist that took care of you is not available. Once you are discharged, your primary care physician will handle any further medical issues. Please note that NO REFILLS for any discharge medications will be authorized once you are discharged, as it is imperative that you return to your primary care physician (or establish a relationship with a primary care physician if you do not have one) for your aftercare needs so that they can reassess your need for medications and monitor your lab values.    On the day of Discharge:  VITAL SIGNS:  Blood pressure (!) 155/80, pulse (!) 130, temperature 97.9 F (36.6 C), temperature source Oral, resp. rate 20, height 5\' 7"  (1.702 m), weight 86.2 kg (190 lb), SpO2 97 %. PHYSICAL EXAMINATION:  GENERAL:  82 y.o.-year-old patient lying in the bed with no acute distress.  EYES: Pupils equal, round, reactive to light and accommodation. No scleral icterus. Extraocular muscles intact.  HEENT: Head atraumatic, normocephalic. Oropharynx and nasopharynx clear.    NECK:  Supple, no jugular venous distention. No thyroid enlargement, no tenderness.  LUNGS: Normal breath sounds bilaterally, no wheezing, rales,rhonchi or crepitation. No use of accessory muscles of respiration.  CARDIOVASCULAR: S1, S2 normal. No murmurs, rubs, or gallops.  ABDOMEN: Soft, non-tender, non-distended. Bowel sounds present. No organomegaly or mass.  EXTREMITIES: No pedal edema, cyanosis, or clubbing.  NEUROLOGIC: Cranial nerves II through XII are intact. Muscle strength 5/5 in all extremities. Sensation intact. Gait not checked.  PSYCHIATRIC: The patient is alert and oriented x 3.  SKIN: No obvious rash, lesion, or ulcer.  DATA REVIEW:   CBC  Recent Labs Lab 12/30/16 0207  WBC 14.2*  HGB 11.3*  HCT 33.8*  PLT 292    Chemistries   Recent Labs Lab 12/29/16 1729 12/30/16 0207  NA 136 136  K 2.9* 3.2*  CL 100* 104  CO2 21* 23  GLUCOSE 239* 245*  BUN 21* 20  CREATININE 1.07 0.97  CALCIUM 8.6* 7.8*  AST 24  --   ALT 16*  --   ALKPHOS 152*  --   BILITOT 1.3*  --      Follow-up Information    Alanson Aly, FNP. Schedule an appointment as soon as possible for a visit in 1 week(s).   Specialty:  Family Medicine       Algernon Huxley, MD. Schedule an appointment as soon as possible for a visit on 01/04/2017.   Specialties:  Vascular Surgery, Radiology, Interventional Cardiology Why:  @ 10:30 scheduled Contact information: [place] [address] [phone]           Please note, he remains at very high risk for readmissions.  Management plans discussed with the patient, family and they are in agreement.  CODE STATUS: Prior   TOTAL TIME TAKING CARE OF THIS PATIENT: 45 minutes.    Max Sane M.D on 01/03/2017 at 5:55 AM  Between 7am to 6pm - Pager - [phone]  After 6pm go to [email or web address] - Proofreader  [facility]  Office  [phone]  CC: Primary care physician; Alanson Aly,  FNP   Note: This dictation was prepared with Dragon dictation along with smaller phrase technology. Any transcriptional errors that result from this process are unintentional.

## 2017-01-04 ENCOUNTER — Encounter (INDEPENDENT_AMBULATORY_CARE_PROVIDER_SITE_OTHER): Payer: Self-pay | Admitting: Vascular Surgery

## 2017-01-04 ENCOUNTER — Ambulatory Visit (INDEPENDENT_AMBULATORY_CARE_PROVIDER_SITE_OTHER): Payer: Medicare Other | Admitting: Vascular Surgery

## 2017-01-04 VITALS — BP 120/84 | HR 87 | Resp 16

## 2017-01-04 DIAGNOSIS — E119 Type 2 diabetes mellitus without complications: Secondary | ICD-10-CM

## 2017-01-04 DIAGNOSIS — Z794 Long term (current) use of insulin: Secondary | ICD-10-CM

## 2017-01-04 DIAGNOSIS — I4891 Unspecified atrial fibrillation: Secondary | ICD-10-CM | POA: Diagnosis not present

## 2017-01-04 DIAGNOSIS — I10 Essential (primary) hypertension: Secondary | ICD-10-CM | POA: Diagnosis not present

## 2017-01-04 DIAGNOSIS — I70269 Atherosclerosis of native arteries of extremities with gangrene, unspecified extremity: Secondary | ICD-10-CM

## 2017-01-04 DIAGNOSIS — E785 Hyperlipidemia, unspecified: Secondary | ICD-10-CM | POA: Diagnosis not present

## 2017-01-05 DIAGNOSIS — I70269 Atherosclerosis of native arteries of extremities with gangrene, unspecified extremity: Secondary | ICD-10-CM | POA: Insufficient documentation

## 2017-01-05 NOTE — Patient Instructions (Signed)
Stump and Prosthesis Care °When an arm or leg is removed, it is important to care for the artificial body part that replaces it (prosthesis) and for the remaining end of the arm or leg (stump). Caring for the stump and prosthesis will help you to be comfortable, active, and healthy. °How to care for your stump °Cleaning Your Skin °· Wash your stump with a mild antibacterial soap at least once per day. °· Wash your stump after getting dirty or sweaty. °· After washing your stump, pat it dry and let it air-dry for 5-10 minutes. °· Do not soak your stump in a warm or hot bath for longer than 20 minutes at a time. °· Avoid shaving hair on the stump. Hair that grows out after being shaved is more easily irritated by the prosthesis. °Using Skin Care Products °· Apply ointment to your surgical scar if your health care provider instructed you to do so. This can keep the scar soft and help it heal. °· Do not put creams and lotions on your stump unless your health care provider says it is okay. If your health care provider says it is okay to put creams and lotions on your stump, do not use lotions that contain petroleum jelly. °· Do not use skin care products with an alcohol base. These products can be harmful to your skin. They can also damage the lining of the prosthesis. °· Consider using an antiperspirant spray on the skin of the stump if you are prone to sweating. °Other Instructions °· Every day, look closely at the skin on your stump. Use a mirror with a long handle to check areas you cannot see, or ask a friend or family member to check those areas. Look for areas that appear reddish, swollen, or irritated. Pay extra attention to places where the stump and prosthesis rub together. °How to care for your prosthesis °Cleaning Your Prosthesis °· Use hot water and antibacterial soap to wash your prosthesis. °Attaching Your Prosthesis °· Make sure your prosthesis is clean before you attach it to your stump. All the parts  that touch your skin should be clean and dry. °· Be sure you understand how to attach the prosthesis. A prosthetic specialist (prosthetist) can show you how to do this. It is a good idea to practice several times while he or she watches. °· If you were given wraps or socks to wear under the prosthesis, make sure to wear them. °Other Instructions °· Exercise and move your prosthesis as recommended by your physical therapist. °· Follow your health care provider's instructions about the length of time you should wear your prosthesis. You will likely need to limit the amount of time you wear your prosthesis at first. You may be instructed to increase the time you wear your prosthesis a little bit each day. °Contact a health care provider if: °· The prosthesis does not seem to fit correctly. °· You have an itchy rash or a sore on your stump. °· Sweating between the stump and the prosthesis is heavy and efforts to control the sweating do not work. °Get help right away if: °· Your stump is red, swollen, painful to the touch, or hot. °· A bad smell develops around the stump. °· There is a sore on your stump that is not healing. °· Your stump is colder than the upper part of the limb. °· Skin on your stump turns gray or black. °· There is any drainage coming from your stump. °This information   is not intended to replace advice given to you by your health care provider. Make sure you discuss any questions you have with your health care provider. °Document Released: 08/04/2009 Document Revised: 01/04/2016 Document Reviewed: 05/06/2014 °Elsevier Interactive Patient Education © 2018 [employer] ° °

## 2017-01-05 NOTE — Assessment & Plan Note (Signed)
Despite multiple revascularization procedures to his left foot wound continues to worsen and he now has progression of his gangrenous changes.  I have recommended to him after his last angiogram as well as in the hospital that he consider left below-knee amputation as I do not believe he has enough salvageable foot to bear weight on at this point. In addition, his pain is so significant that without removal of the foot, I do not think his pain will be relieved. It appears that he and his family have now come to this determination as well, and they are agreeable to proceed with left below-knee amputation. This is a very difficult and involved situation and he does have medical comorbidities which are severe and significant. He will be scheduled for left below-knee amputation in the near future at his convenience.

## 2017-01-05 NOTE — Progress Notes (Signed)
MRN : [id number]  [name] is a 82 y.o. (November 26, 1934) male who presents with chief complaint of  Chief Complaint  Patient presents with  . Follow-up    postop  .  History of Present Illness: Patient returns today in follow up of left foot wound which is not healing. He has undergone 3 revascularization procedures, multiple debridements and digital amputations. His left second toe is now gangrenous. He has persistent and debilitating pain which have required narcotics and his mental status has dramatically declined over the past 6-8 weeks. His wife and children accompany him today. He has not had fevers or chills since his last hospitalization. He denies any chest pain or current shortness of breath although he remains frequently tachycardic. I have recommended to him after his last angiogram as well as in the hospital that he consider left below-knee amputation as I do not believe he has enough salvageable foot to bear weight on at this point. In addition, his pain is so significant that without removal of the foot, I do not think his pain will be relieved.  Current Outpatient Prescriptions  Medication Sig Dispense Refill  . acetaminophen (TYLENOL) 500 MG tablet Take 500 mg by mouth every 6 (six) hours as needed for moderate pain.    Marland Kitchen amoxicillin-clavulanate (AUGMENTIN) 875-125 MG tablet Take 1 tablet by mouth every 12 (twelve) hours. 14 tablet 0  . aspirin EC 81 MG tablet Take 1 tablet (81 mg total) by mouth daily. 150 tablet 2  . atorvastatin (LIPITOR) 80 MG tablet Take 80 mg by mouth daily at 6 PM.     . diltiazem (CARDIZEM CD) 300 MG 24 hr capsule Take 300 mg by mouth daily.    . furosemide (LASIX) 20 MG tablet TAKE 1 TABLET DAILY    . gabapentin (NEURONTIN) 100 MG capsule Take 100 mg by mouth daily as needed (nerve pain).     Marland Kitchen HYDROcodone-acetaminophen (NORCO) 5-325 MG tablet Take 1 tablet by mouth every 6 (six) hours as needed for moderate pain. 30 tablet 0  . insulin  glargine (LANTUS) 100 UNIT/ML injection Inject 0.24 mLs (24 Units total) into the skin at bedtime. 10 mL 11  . JARDIANCE 10 MG TABS tablet Take 10 mg by mouth daily.    . metFORMIN (GLUCOPHAGE) 1000 MG tablet TAKE 1 TABLET TWICE A DAY WITH MEALS    . TOPROL XL 100 MG 24 hr tablet Take 100 mg by mouth daily.    . TRULICITY 7.86 VE/7.2CN SOPN 0.75 mg once a week.     No current facility-administered medications for this visit.     Past Medical History:  Diagnosis Date  . Arthritis   . Atrial fibrillation ([place])   . Carcinoma of prostate ([place])   . CHF (congestive heart failure) ([place])   . Chronic kidney disease   . Diabetes mellitus without complication ([place])   . ED (erectile dysfunction)   . Frequent urination   . Hyperlipidemia   . Hypertension   . Moderate mitral insufficiency   . Peripheral vascular disease ([place])   . Pneumonia 04/2016  . Prostate cancer ([place])   . Sleep apnea    OSA--USE C-PAP  . Ulcer of left foot due to type 2 diabetes mellitus ([place])   . Urinary stress incontinence, male     Past Surgical History:  Procedure Laterality Date  . APLIGRAFT PLACEMENT Left 10/15/2016   Procedure: APLIGRAFT PLACEMENT;  Surgeon: Albertine Patricia, DPM;  Location: [facility];  Service:  Podiatry;  Laterality: Left;  necrotic ulcer  . CHOLECYSTECTOMY    . EYE SURGERY Bilateral    Cataract Extraction with IOL  . IRRIGATION AND DEBRIDEMENT FOOT Left 10/15/2016   Procedure: IRRIGATION AND DEBRIDEMENT FOOT-EXCISIONAL DEBRIDEMENT OF SKIN 3RD, 4TH AND 5TH TOES WITH APPLICATION OF APLIGRAFT;  Surgeon: Albertine Patricia, DPM;  Location: [facility];  Service: Podiatry;  Laterality: Left;  necrotic,gangrene  . IRRIGATION AND DEBRIDEMENT FOOT Left 12/09/2016   Procedure: IRRIGATION AND DEBRIDEMENT FOOT-MUSCLE/FASCIA, RESECTION OF FOURTH AND FIFTH METATARSAL NECROTIC BONE;  Surgeon: Albertine Patricia, DPM;  Location: [facility];  Service: Podiatry;  Laterality: Left;  . LOWER EXTREMITY ANGIOGRAPHY Left  08/16/2016   Procedure: Lower Extremity Angiography;  Surgeon: Algernon Huxley, MD;  Location: [place] [facility];  Service: Cardiovascular;  Laterality: Left;  . LOWER EXTREMITY ANGIOGRAPHY Left 09/01/2016   Procedure: Lower Extremity Angiography;  Surgeon: Algernon Huxley, MD;  Location: [facility];  Service: Cardiovascular;  Laterality: Left;  . LOWER EXTREMITY ANGIOGRAPHY Left 10/14/2016   Procedure: Lower Extremity Angiography;  Surgeon: Algernon Huxley, MD;  Location: [facility];  Service: Cardiovascular;  Laterality: Left;  . LOWER EXTREMITY ANGIOGRAPHY Left 12/20/2016   Procedure: Lower Extremity Angiography;  Surgeon: Algernon Huxley, MD;  Location: [facility];  Service: Cardiovascular;  Laterality: Left;  . LOWER EXTREMITY INTERVENTION  09/01/2016   Procedure: Lower Extremity Intervention;  Surgeon: Algernon Huxley, MD;  Location: [facility];  Service: Cardiovascular;;  . PROSTATE SURGERY     removal  . TONSILLECTOMY     as a child   Family History  Problem Relation Age of Onset  . Prostate cancer Neg Hx   . Bladder Cancer Neg Hx   . Kidney disease Neg Hx   No bleeding disorders, clotting disorders, autoimmune diseases, or aneurysms  Social History      Social History  Substance Use Topics  . Smoking status: Former Smoker    Quit date: 10/14/1994  . Smokeless tobacco: Never Used  . Alcohol use No  Married, wife accompanies today Son and daughter are also present today.       Allergies  Allergen Reactions  . Contrast Media [Iodinated Diagnostic Agents]     SOB  . Iohexol     Desc: Respiratory Distress, Laryngedema, diaphoresis, Onset Date: [id number]   . Latex Rash       REVIEW OF SYSTEMS(Negative unless checked)  Constitutional: '[]' Weight loss'[]' Fever'[]' Chills Cardiac:'[]' Chest pain'[]' Chest pressure'[x]' Palpitations '[]' Shortness of breath when laying flat '[]' Shortness of breath at rest '[x]' Shortness of  breath with exertion. Vascular: '[x]' Pain in legs with walking'[]' Pain in legsat rest'[]' Pain in legs when laying flat '[]' Claudication '[x]' Pain in feet when walking '[x]' Pain in feet at rest '[x]' Pain in feet when laying flat '[]' History of DVT '[]' Phlebitis '[x]' Swelling in legs '[]' Varicose veins '[x]' Non-healing ulcers Pulmonary: '[]' Uses home oxygen '[x]' Productive cough'[]' Hemoptysis '[]' Wheeze '[]' COPD '[]' Asthma Neurologic: '[]' Dizziness '[]' Blackouts '[]' Seizures '[]' History of stroke '[]' History of TIA'[]' Aphasia '[]' Temporary blindness'[]' Dysphagia '[]' Weaknessor numbness in arms '[]' Weakness or numbnessin legs Musculoskeletal: '[x]' Arthritis '[]' Joint swelling '[]' Joint pain '[]' Low back pain Hematologic:'[]' Easy bruising'[]' Easy bleeding '[]' Hypercoagulable state '[]' Anemic '[]' Hepatitis Gastrointestinal:'[]' Blood in stool'[]' Vomiting blood'[]' Gastroesophageal reflux/heartburn'[]' Abdominal pain Genitourinary: '[]' Chronic kidney disease '[]' Difficulturination '[]' Frequenturination '[]' Burning with urination'[]' Hematuria Skin: '[]' Rashes '[x]' Ulcers '[x]' Wounds Psychological: '[]' History of anxiety'[]' History of major depression.    Physical Examination  BP 120/84   Pulse 87   Resp 16  Gen:  WD/WN, NAD Head: [place]/AT, No temporalis wasting. Ear/Nose/Throat: Hearing grossly intact, nares w/o erythema or drainage, trachea midline Eyes: Conjunctiva clear. Sclera non-icteric  Neck: Supple.  No JVD.  Pulmonary:  Good air movement, no use of accessory muscles.  Cardiac: irregular and tachycardic Vascular:  Vessel Right Left  Radial Palpable Palpable                          PT 1+ Palpable Not Palpable  DP 1+ Palpable 1+ Palpable     Musculoskeletal: M/S 5/5 throughout.  There is about a 3 cm draining wound on the lateral aspect of the left foot. The second toe is now frankly gangrenous and there is a black eschar overlying a lot of the anterior foot wound. The drainage is  dark serous at this point. The foot is ruborous and 1+ edema is present in the foot and lower leg. No right foot wounds or ulcerations are present. Neurologic: Sensation grossly intact in extremities.  Symmetrical.  Speech is fluent.  Psychiatric: Judgment and insight are poor. Dermatologic: Left foot wound as above      Labs Recent Results (from the past 2160 hour(s))  Basic metabolic panel     Status: Abnormal   Collection Time: 10/12/16  3:32 PM  Result Value Ref Range   Sodium 139 135 - 145 mmol/L   Potassium 4.4 3.5 - 5.1 mmol/L   Chloride 104 101 - 111 mmol/L   CO2 26 22 - 32 mmol/L   Glucose, Bld 118 (H) 65 - 99 mg/dL   BUN 30 (H) 6 - 20 mg/dL   Creatinine, Ser 1.10 0.61 - 1.24 mg/dL   Calcium 9.2 8.9 - 10.3 mg/dL   GFR calc non Af Amer >60 >60 mL/min   GFR calc Af Amer >60 >60 mL/min    Comment: (NOTE) The eGFR has been calculated using the CKD EPI equation. This calculation has not been validated in all clinical situations. eGFR's persistently <60 mL/min signify possible Chronic Kidney Disease.    Anion gap 9 5 - 15  BUN     Status: Abnormal   Collection Time: 10/14/16  8:38 AM  Result Value Ref Range   BUN 34 (H) 6 - 20 mg/dL  Creatinine, serum     Status: Abnormal   Collection Time: 10/14/16  8:38 AM  Result Value Ref Range   Creatinine, Ser 1.22 0.61 - 1.24 mg/dL   GFR calc non Af Amer 55 (L) >60 mL/min   GFR calc Af Amer >60 >60 mL/min    Comment: (NOTE) The eGFR has been calculated using the CKD EPI equation. This calculation has not been validated in all clinical situations. eGFR's persistently <60 mL/min signify possible Chronic Kidney Disease.   Protime-INR     Status: Abnormal   Collection Time: 10/14/16  8:38 AM  Result Value Ref Range   Prothrombin Time 32.8 (H) 11.4 - 15.2 seconds   INR 3.12   Glucose, capillary     Status: Abnormal   Collection Time: 10/14/16  1:26 PM  Result Value Ref Range   Glucose-Capillary 242 (H) 65 - 99 mg/dL    Glucose, capillary     Status: Abnormal   Collection Time: 10/15/16  6:08 AM  Result Value Ref Range   Glucose-Capillary 237 (H) 65 - 99 mg/dL  Surgical pathology     Status: None   Collection Time: 10/15/16  8:12 AM  Result Value Ref Range   SURGICAL PATHOLOGY      Surgical Pathology CASE: [id number] PATIENT: Janelle Floor Surgical Pathology Report     SPECIMEN SUBMITTED: A. Toes,  left foot, 3rd, 4th, and 5th  CLINICAL HISTORY: None provided  PRE-OPERATIVE DIAGNOSIS: Skin ulcers of foot  POST-OPERATIVE DIAGNOSIS: None provided.     DIAGNOSIS: A. TOES, LEFT THIRD, FOURTH, AND FIFTH; AMPUTATION: - SKIN AND SOFT TISSUE WITH CHRONIC ULCERATION AND NECROSIS. - UNDERLYING OSTEOMYELITIS INVOLVING FOURTH TOE. - VIABLE TISSUE AT MARGINS.   GROSS DESCRIPTION:  A. Labeled: third, fourth and fifth toes from left foot  Size: 6.3 x 5.7 x 2.1 cm partial forefoot amputation with 3 digits  Description of lesion(s): all 3 digits have ulceration sloughing  Proximal margin: sloughing and discoloration grossly extending to surgical margins on all 3 digits  Bone: bone margin on all 3 digits smooth articular surface  Other findings: margin inked blue (third digit sections marked green, fourth digit sections marked black i n cassettes 1 and 2)  Block summary: 1-perpendicular skin margins 2-en face bones margin following decalcification 3-representative fifth digit skin discoloration and bone following decalcification 4-representative fourth digit skin discoloration and bone following decalcification 5-representative third digit skin discoloration and bone following decalcification  Final Diagnosis performed by Quay Burow, MD.  Electronically signed 10/19/2016 9:32:00AM    The electronic signature indicates that the named Attending Pathologist has evaluated the specimen  Technical component performed at [facility], [address] [place], [place] [address] Lab:  [phone] Dir: Darrick Penna. Evette Doffing, MD  Professional component performed at [facility], [facility], [place], [place], [place] [address] Lab: [phone] Dir: Dellia Nims. Rubinas, MD    Glucose, capillary     Status: Abnormal   Collection Time: 10/15/16  9:12 AM  Result Value Ref Range   Glucose-Capillary 195 (H) 65 - 99 mg/dL  CBC with Differential     Status: Abnormal   Collection Time: 10/21/16  8:28 PM  Result Value Ref Range   WBC 15.2 (H) 3.8 - 10.6 K/uL   RBC 4.94 4.40 - 5.90 MIL/uL   Hemoglobin 13.9 13.0 - 18.0 g/dL   HCT 42.3 40.0 - 52.0 %   MCV 85.7 80.0 - 100.0 fL   MCH 28.1 26.0 - 34.0 pg   MCHC 32.8 32.0 - 36.0 g/dL   RDW 15.1 (H) 11.5 - 14.5 %   Platelets 298 150 - 440 K/uL   Neutrophils Relative % 72 %   Neutro Abs 11.1 (H) 1.4 - 6.5 K/uL   Lymphocytes Relative 12 %   Lymphs Abs 1.8 1.0 - 3.6 K/uL   Monocytes Relative 11 %   Monocytes Absolute 1.6 (H) 0.2 - 1.0 K/uL   Eosinophils Relative 4 %   Eosinophils Absolute 0.5 0 - 0.7 K/uL   Basophils Relative 1 %   Basophils Absolute 0.1 0 - 0.1 K/uL  Comprehensive metabolic panel     Status: Abnormal   Collection Time: 10/21/16  8:28 PM  Result Value Ref Range   Sodium 135 135 - 145 mmol/L   Potassium 3.8 3.5 - 5.1 mmol/L   Chloride 99 (L) 101 - 111 mmol/L   CO2 23 22 - 32 mmol/L   Glucose, Bld 162 (H) 65 - 99 mg/dL   BUN 29 (H) 6 - 20 mg/dL   Creatinine, Ser 1.18 0.61 - 1.24 mg/dL   Calcium 8.7 (L) 8.9 - 10.3 mg/dL   Total Protein 7.5 6.5 - 8.1 g/dL   Albumin 3.4 (L) 3.5 - 5.0 g/dL   AST 64 (H) 15 - 41 U/L   ALT 93 (H) 17 - 63 U/L   Alkaline Phosphatase 371 (H) 38 -  126 U/L   Total Bilirubin 0.8 0.3 - 1.2 mg/dL   GFR calc non Af Amer 57 (L) >60 mL/min   GFR calc Af Amer >60 >60 mL/min    Comment: (NOTE) The eGFR has been calculated using the CKD EPI equation. This calculation has not been validated in all clinical situations. eGFR's persistently <60 mL/min signify possible  Chronic Kidney Disease.    Anion gap 13 5 - 15  Troponin I     Status: None   Collection Time: 10/21/16  8:28 PM  Result Value Ref Range   Troponin I <0.03 <0.03 ng/mL  Glucose, capillary     Status: Abnormal   Collection Time: 10/22/16 12:49 AM  Result Value Ref Range   Glucose-Capillary 141 (H) 65 - 99 mg/dL  Hemoglobin A1c     Status: Abnormal   Collection Time: 10/22/16  5:22 AM  Result Value Ref Range   Hgb A1c MFr Bld 8.5 (H) 4.8 - 5.6 %    Comment: (NOTE)         Pre-diabetes: 5.7 - 6.4         Diabetes: >6.4         Glycemic control for adults with diabetes: <7.0    Mean Plasma Glucose 197 mg/dL    Comment: (NOTE) Performed At: [facility] [place], [place] [id number] Lindon Romp MD GY:[id number]   Lipid panel     Status: Abnormal   Collection Time: 10/22/16  5:22 AM  Result Value Ref Range   Cholesterol 100 0 - 200 mg/dL   Triglycerides 78 <150 mg/dL   HDL 28 (L) >40 mg/dL   Total CHOL/HDL Ratio 3.6 RATIO   VLDL 16 0 - 40 mg/dL   LDL Cholesterol 56 0 - 99 mg/dL    Comment:        Total Cholesterol/HDL:CHD Risk Coronary Heart Disease Risk Table                     Men   Women  1/2 Average Risk   3.4   3.3  Average Risk       5.0   4.4  2 X Average Risk   9.6   7.1  3 X Average Risk  23.4   11.0        Use the calculated Patient Ratio above and the CHD Risk Table to determine the patient's CHD Risk.        ATP III CLASSIFICATION (LDL):  <100     mg/dL   Optimal  100-129  mg/dL   Near or Above                    Optimal  130-159  mg/dL   Borderline  160-189  mg/dL   High  >190     mg/dL   Very High   Protime-INR     Status: Abnormal   Collection Time: 10/22/16  5:22 AM  Result Value Ref Range   Prothrombin Time 51.4 (H) 11.4 - 15.2 seconds   INR 5.47 (HH)     Comment: CRITICAL RESULT CALLED TO, READ BACK BY AND VERIFIED WITH: Desiree Bomson @ [phone] 10/22/16 by [place]   Glucose, capillary     Status: None   Collection Time:  10/22/16  7:32 AM  Result Value Ref Range   Glucose-Capillary 86 65 - 99 mg/dL  CK     Status: None   Collection Time: 10/22/16  9:45 AM  Result Value Ref Range   Total CK 131 49 - 397 U/L  Lactate dehydrogenase     Status: None   Collection Time: 10/22/16  9:45 AM  Result Value Ref Range   LDH 172 98 - 192 U/L  Lactic acid, plasma     Status: None   Collection Time: 10/22/16  9:45 AM  Result Value Ref Range   Lactic Acid, Venous 1.4 0.5 - 1.9 mmol/L  Glucose, capillary     Status: Abnormal   Collection Time: 10/22/16 12:59 PM  Result Value Ref Range   Glucose-Capillary 133 (H) 65 - 99 mg/dL  Glucose, capillary     Status: Abnormal   Collection Time: 10/22/16  4:51 PM  Result Value Ref Range   Glucose-Capillary 115 (H) 65 - 99 mg/dL  Glucose, capillary     Status: Abnormal   Collection Time: 10/22/16  8:01 PM  Result Value Ref Range   Glucose-Capillary 145 (H) 65 - 99 mg/dL  Protime-INR     Status: Abnormal   Collection Time: 10/23/16  4:56 AM  Result Value Ref Range   Prothrombin Time 32.3 (H) 11.4 - 15.2 seconds   INR 3.06   Hepatic function panel     Status: Abnormal   Collection Time: 10/23/16  4:56 AM  Result Value Ref Range   Total Protein 6.4 (L) 6.5 - 8.1 g/dL   Albumin 2.8 (L) 3.5 - 5.0 g/dL   AST 32 15 - 41 U/L   ALT 54 17 - 63 U/L   Alkaline Phosphatase 269 (H) 38 - 126 U/L   Total Bilirubin 1.2 0.3 - 1.2 mg/dL   Bilirubin, Direct 0.2 0.1 - 0.5 mg/dL   Indirect Bilirubin 1.0 (H) 0.3 - 0.9 mg/dL  Urinalysis, Complete w Microscopic     Status: Abnormal   Collection Time: 10/23/16  6:26 AM  Result Value Ref Range   Color, Urine YELLOW (A) YELLOW   APPearance CLEAR (A) CLEAR   Specific Gravity, Urine 1.025 1.005 - 1.030   pH 5.0 5.0 - 8.0   Glucose, UA NEGATIVE NEGATIVE mg/dL   Hgb urine dipstick NEGATIVE NEGATIVE   Bilirubin Urine NEGATIVE NEGATIVE   Ketones, ur 5 (A) NEGATIVE mg/dL   Protein, ur 30 (A) NEGATIVE mg/dL   Nitrite NEGATIVE NEGATIVE    Leukocytes, UA NEGATIVE NEGATIVE   RBC / HPF 0-5 0 - 5 RBC/hpf   WBC, UA 0-5 0 - 5 WBC/hpf   Bacteria, UA NONE SEEN NONE SEEN   Squamous Epithelial / LPF 0-5 (A) NONE SEEN   Mucous PRESENT   Glucose, capillary     Status: Abnormal   Collection Time: 10/23/16  7:49 AM  Result Value Ref Range   Glucose-Capillary 121 (H) 65 - 99 mg/dL  Glucose, capillary     Status: Abnormal   Collection Time: 10/23/16 11:25 AM  Result Value Ref Range   Glucose-Capillary 104 (H) 65 - 99 mg/dL  Glucose, capillary     Status: Abnormal   Collection Time: 10/23/16  4:33 PM  Result Value Ref Range   Glucose-Capillary 126 (H) 65 - 99 mg/dL  Glucose, capillary     Status: Abnormal   Collection Time: 10/23/16  9:20 PM  Result Value Ref Range   Glucose-Capillary 244 (H) 65 - 99 mg/dL  Protime-INR     Status: Abnormal   Collection Time: 10/24/16  5:36 AM  Result Value Ref Range   Prothrombin Time 29.9 (H) 11.4 - 15.2 seconds   INR  2.78   Glucose, capillary     Status: Abnormal   Collection Time: 10/24/16  7:42 AM  Result Value Ref Range   Glucose-Capillary 204 (H) 65 - 99 mg/dL  Vancomycin, trough     Status: Abnormal   Collection Time: 10/24/16  9:17 AM  Result Value Ref Range   Vancomycin Tr 11 (L) 15 - 20 ug/mL  Glucose, capillary     Status: Abnormal   Collection Time: 10/24/16 10:50 AM  Result Value Ref Range   Glucose-Capillary 217 (H) 65 - 99 mg/dL  Glucose, capillary     Status: Abnormal   Collection Time: 10/24/16 11:59 AM  Result Value Ref Range   Glucose-Capillary 223 (H) 65 - 99 mg/dL  Glucose, capillary     Status: Abnormal   Collection Time: 10/24/16  5:02 PM  Result Value Ref Range   Glucose-Capillary 136 (H) 65 - 99 mg/dL  Glucose, capillary     Status: Abnormal   Collection Time: 12/09/16 10:40 AM  Result Value Ref Range   Glucose-Capillary 186 (H) 65 - 99 mg/dL  I-STAT 4, (NA,K, GLUC, HGB,HCT)     Status: Abnormal   Collection Time: 12/09/16 11:01 AM  Result Value Ref Range     Sodium 137 135 - 145 mmol/L   Potassium 3.8 3.5 - 5.1 mmol/L   Glucose, Bld 195 (H) 65 - 99 mg/dL   HCT 46.0 39.0 - 52.0 %   Hemoglobin 15.6 13.0 - 17.0 g/dL  Surgical pathology     Status: None   Collection Time: 12/09/16  1:20 PM  Result Value Ref Range   SURGICAL PATHOLOGY      Surgical Pathology CASE: [id number] PATIENT: Janelle Floor Surgical Pathology Report     SPECIMEN SUBMITTED: A. Necrotic tissue, left foot B. Necrotic bone, left 4th and 5th metatarsal  CLINICAL HISTORY: None provided  PRE-OPERATIVE DIAGNOSIS: Gangrene of toe - left foot I96, foot ulcer - left L97.523  POST-OPERATIVE DIAGNOSIS: Same as pre-op     DIAGNOSIS: A. SOFT TISSUE, LEFT FOOT; DEBRIDEMENT: - NECROTIC SOFT TISSUE.  B. BONE, LEFT FOURTH AND FIFTH METATARSALS; EXCISION: - ACUTE OSTEOMYELITIS WITH PARTIAL NECROSIS. - CUT ENDS OF THE BONES APPEAR VIABLE   GROSS DESCRIPTION: A. Labeled: left foot necrotic tissue Tissue fragment(s):3 Size: aggregate, 2.4 x 2.2 x 0.8 cm Description: yellow gray fragments of soft tissue  Entirely submitted in one cassette(s).   B. Labeled: left/fifth metatarsal necrotic bone Tissue fragment(s): 2 Size: 2.5 x 1.5 x 1.2 and 2.3 x 1.5 x 1.4 cm Description: fragments of bone, each with 1 articular surface and  one cut surface, cut surfaces differentially inked specimen sectioned  Block summary 1-2-representative longitudinal sections of each following decalcification   Final Diagnosis performed by Bryan Lemma, MD.  Electronically signed 12/13/2016 8:36:56PM    The electronic signature indicates that the named Attending Pathologist has evaluated the specimen  Technical component performed at [facility], [address], [place], [place] [address] Lab: [phone] Dir: Darrick Penna. Evette Doffing, MD  Professional component performed at [facility], [facility], [place], [place], [place] [address] Lab: [phone] Dir: Dellia Nims. Rubinas, MD    BUN     Status: Abnormal   Collection Time: 12/20/16  7:58 AM  Result Value Ref Range   BUN 21 (H) 6 - 20 mg/dL  Creatinine, serum     Status: Abnormal   Collection Time: 12/20/16  7:58 AM  Result Value Ref Range   Creatinine, Ser 1.14 0.61 -  1.24 mg/dL   GFR calc non Af Amer 59 (L) >60 mL/min   GFR calc Af Amer >60 >60 mL/min    Comment: (NOTE) The eGFR has been calculated using the CKD EPI equation. This calculation has not been validated in all clinical situations. eGFR's persistently <60 mL/min signify possible Chronic Kidney Disease.   Protime-INR     Status: Abnormal   Collection Time: 12/20/16  7:58 AM  Result Value Ref Range   Prothrombin Time 28.1 (H) 11.4 - 15.2 seconds   INR 2.57   Glucose, capillary     Status: Abnormal   Collection Time: 12/20/16  8:59 AM  Result Value Ref Range   Glucose-Capillary 148 (H) 65 - 99 mg/dL  Glucose, capillary     Status: Abnormal   Collection Time: 12/20/16 12:20 PM  Result Value Ref Range   Glucose-Capillary 200 (H) 65 - 99 mg/dL  Lactic acid, plasma     Status: Abnormal   Collection Time: 12/29/16  5:29 PM  Result Value Ref Range   Lactic Acid, Venous 2.5 (HH) 0.5 - 1.9 mmol/L    Comment: CRITICAL RESULT CALLED TO, READ BACK BY AND VERIFIED WITH Jana Half RN AT 1696 12/29/16 MSS.   Comprehensive metabolic panel     Status: Abnormal   Collection Time: 12/29/16  5:29 PM  Result Value Ref Range   Sodium 136 135 - 145 mmol/L   Potassium 2.9 (L) 3.5 - 5.1 mmol/L   Chloride 100 (L) 101 - 111 mmol/L   CO2 21 (L) 22 - 32 mmol/L   Glucose, Bld 239 (H) 65 - 99 mg/dL   BUN 21 (H) 6 - 20 mg/dL   Creatinine, Ser 1.07 0.61 - 1.24 mg/dL   Calcium 8.6 (L) 8.9 - 10.3 mg/dL   Total Protein 6.8 6.5 - 8.1 g/dL   Albumin 2.9 (L) 3.5 - 5.0 g/dL   AST 24 15 - 41 U/L   ALT 16 (L) 17 - 63 U/L   Alkaline Phosphatase 152 (H) 38 - 126 U/L   Total Bilirubin 1.3 (H) 0.3 - 1.2 mg/dL   GFR calc non Af Amer >60 >60 mL/min    GFR calc Af Amer >60 >60 mL/min    Comment: (NOTE) The eGFR has been calculated using the CKD EPI equation. This calculation has not been validated in all clinical situations. eGFR's persistently <60 mL/min signify possible Chronic Kidney Disease.    Anion gap 15 5 - 15  CBC with Differential     Status: Abnormal   Collection Time: 12/29/16  5:29 PM  Result Value Ref Range   WBC 17.4 (H) 3.8 - 10.6 K/uL   RBC 4.77 4.40 - 5.90 MIL/uL   Hemoglobin 13.0 13.0 - 18.0 g/dL   HCT 39.1 (L) 40.0 - 52.0 %   MCV 81.9 80.0 - 100.0 fL   MCH 27.2 26.0 - 34.0 pg   MCHC 33.2 32.0 - 36.0 g/dL   RDW 16.2 (H) 11.5 - 14.5 %   Platelets 348 150 - 440 K/uL   Neutrophils Relative % 88 %   Neutro Abs 15.4 (H) 1.4 - 6.5 K/uL   Lymphocytes Relative 6 %   Lymphs Abs 1.0 1.0 - 3.6 K/uL   Monocytes Relative 6 %   Monocytes Absolute 1.0 0.2 - 1.0 K/uL   Eosinophils Relative 0 %   Eosinophils Absolute 0.0 0 - 0.7 K/uL   Basophils Relative 0 %   Basophils Absolute 0.1 0 - 0.1 K/uL  Urinalysis,  Complete w Microscopic     Status: Abnormal   Collection Time: 12/29/16  5:29 PM  Result Value Ref Range   Color, Urine YELLOW (A) YELLOW   APPearance CLEAR (A) CLEAR   Specific Gravity, Urine 1.028 1.005 - 1.030   pH 5.0 5.0 - 8.0   Glucose, UA >=500 (A) NEGATIVE mg/dL   Hgb urine dipstick SMALL (A) NEGATIVE   Bilirubin Urine NEGATIVE NEGATIVE   Ketones, ur 20 (A) NEGATIVE mg/dL   Protein, ur NEGATIVE NEGATIVE mg/dL   Nitrite NEGATIVE NEGATIVE   Leukocytes, UA NEGATIVE NEGATIVE   RBC / HPF 0-5 0 - 5 RBC/hpf   WBC, UA 0-5 0 - 5 WBC/hpf   Bacteria, UA NONE SEEN NONE SEEN   Squamous Epithelial / LPF 0-5 (A) NONE SEEN  Blood Culture (routine x 2)     Status: None   Collection Time: 12/29/16  8:10 PM  Result Value Ref Range   Specimen Description BLOOD BLOOD RIGHT HAND    Special Requests      BOTTLES DRAWN AEROBIC AND ANAEROBIC Blood Culture results may not be optimal due to an excessive volume of blood  received in culture bottles   Culture NO GROWTH 5 DAYS    Report Status 01/03/2017 FINAL   Blood Culture (routine x 2)     Status: None   Collection Time: 12/29/16  8:10 PM  Result Value Ref Range   Specimen Description BLOOD LEFT ANTECUBITAL    Special Requests      BOTTLES DRAWN AEROBIC AND ANAEROBIC Blood Culture results may not be optimal due to an excessive volume of blood received in culture bottles   Culture NO GROWTH 5 DAYS    Report Status 01/03/2017 FINAL   Lactic acid, plasma     Status: Abnormal   Collection Time: 12/29/16  9:50 PM  Result Value Ref Range   Lactic Acid, Venous 3.0 (HH) 0.5 - 1.9 mmol/L    Comment: CRITICAL RESULT CALLED TO, READ BACK BY AND VERIFIED WITH EMILY GANNON @ 2332 ON 12/29/2016 BY [facility]   Protime-INR     Status: Abnormal   Collection Time: 12/29/16  9:51 PM  Result Value Ref Range   Prothrombin Time 44.7 (H) 11.4 - 15.2 seconds   INR 4.59 (HH)     Comment: CRITICAL RESULT CALLED TO, READ BACK BY AND VERIFIED WITH: Saundra Shelling @ 4010 12/29/16 by [facility]   Glucose, capillary     Status: Abnormal   Collection Time: 12/29/16 10:50 PM  Result Value Ref Range   Glucose-Capillary 152 (H) 65 - 99 mg/dL  Surgical PCR screen     Status: None   Collection Time: 12/29/16 11:20 PM  Result Value Ref Range   MRSA, PCR NEGATIVE NEGATIVE   Staphylococcus aureus NEGATIVE NEGATIVE    Comment:        The Xpert SA Assay (FDA approved for NASAL specimens in patients over 71 years of age), is one component of a comprehensive surveillance program.  Test performance has been validated by [facility] for patients greater than or equal to 28 year old. It is not intended to diagnose infection nor to guide or monitor treatment.   Basic metabolic panel     Status: Abnormal   Collection Time: 12/30/16  2:07 AM  Result Value Ref Range   Sodium 136 135 - 145 mmol/L   Potassium 3.2 (L) 3.5 - 5.1 mmol/L   Chloride 104 101 - 111 mmol/L   CO2 23 22 -  32 mmol/L    Glucose, Bld 245 (H) 65 - 99 mg/dL   BUN 20 6 - 20 mg/dL   Creatinine, Ser 0.97 0.61 - 1.24 mg/dL   Calcium 7.8 (L) 8.9 - 10.3 mg/dL   GFR calc non Af Amer >60 >60 mL/min   GFR calc Af Amer >60 >60 mL/min    Comment: (NOTE) The eGFR has been calculated using the CKD EPI equation. This calculation has not been validated in all clinical situations. eGFR's persistently <60 mL/min signify possible Chronic Kidney Disease.    Anion gap 9 5 - 15  CBC     Status: Abnormal   Collection Time: 12/30/16  2:07 AM  Result Value Ref Range   WBC 14.2 (H) 3.8 - 10.6 K/uL   RBC 4.01 (L) 4.40 - 5.90 MIL/uL   Hemoglobin 11.3 (L) 13.0 - 18.0 g/dL   HCT 33.8 (L) 40.0 - 52.0 %   MCV 84.2 80.0 - 100.0 fL   MCH 28.1 26.0 - 34.0 pg   MCHC 33.4 32.0 - 36.0 g/dL   RDW 16.3 (H) 11.5 - 14.5 %   Platelets 292 150 - 440 K/uL  Protime-INR     Status: Abnormal   Collection Time: 12/30/16  2:07 AM  Result Value Ref Range   Prothrombin Time 52.4 (H) 11.4 - 15.2 seconds   INR 5.60 (HH)     Comment: RESULT REPEATED AND VERIFIED CRITICAL RESULT CALLED TO, READ BACK BY AND VERIFIED WITH: EMILY GANNON AT 3846 ON 12/30/16 [place].   APTT     Status: Abnormal   Collection Time: 12/30/16  2:07 AM  Result Value Ref Range   aPTT 65 (H) 24 - 36 seconds    Comment:        IF BASELINE aPTT IS ELEVATED, SUGGEST PATIENT RISK ASSESSMENT BE USED TO DETERMINE APPROPRIATE ANTICOAGULANT THERAPY.   Hemoglobin A1c     Status: Abnormal   Collection Time: 12/30/16  2:07 AM  Result Value Ref Range   Hgb A1c MFr Bld 7.4 (H) 4.8 - 5.6 %    Comment: (NOTE) Pre diabetes:          5.7%-6.4% Diabetes:              >6.4% Glycemic control for   <7.0% adults with diabetes    Mean Plasma Glucose 165.68 mg/dL    Comment: Performed at [facility] [address], [place], [place] [address]  Lactic acid, plasma     Status: Abnormal   Collection Time: 12/30/16  2:07 AM  Result Value Ref Range   Lactic Acid, Venous 2.7 (HH) 0.5 -  1.9 mmol/L    Comment: CRITICAL RESULT CALLED TO, READ BACK BY AND VERIFIED WITH EMILY GANNON AT 5701 ON 12/30/16 [place].   Glucose, capillary     Status: Abnormal   Collection Time: 12/30/16  7:47 AM  Result Value Ref Range   Glucose-Capillary 158 (H) 65 - 99 mg/dL  Glucose, capillary     Status: Abnormal   Collection Time: 12/30/16 11:41 AM  Result Value Ref Range   Glucose-Capillary 174 (H) 65 - 99 mg/dL    Radiology Dg Chest 2 View  Result Date: 12/29/2016 CLINICAL DATA:  Diabetic foot wound. EXAM: CHEST  2 VIEW COMPARISON:  06/03/2016 FINDINGS: Stable top-normal heart size an thoracic aortic atherosclerosis. Opacity at the left lung base is favored to represent atelectasis. Early pneumonia cannot be excluded. There does appear to be a small amount of pleural fluid  bilaterally. No overt edema. No pneumothorax or nodule identified. The visualized skeletal structures are unremarkable. IMPRESSION: Left basilar opacity favored to represent atelectasis. Early pneumonia is not excluded. Small pleural effusions are present bilaterally. Electronically Signed   By: Aletta Edouard M.D.   On: 12/29/2016 17:57   Dg Foot Complete Left  Result Date: 12/29/2016 CLINICAL DATA:  Infected left foot wound with prior toe amputation. EXAM: LEFT FOOT - COMPLETE 3+ VIEW COMPARISON:  10/21/2016 FINDINGS: Since the prior radiograph, there has been further amputation of fourth and fifth metatarsals at the level of their mid shafts. There is some visible gas in the soft tissues anterior to the amputated third, fourth and fifth toes which may be on the basis of wound infection. No obvious bony destruction to suggest osteomyelitis. Vascular calcifications of digital arteries. IMPRESSION: No evidence of osteomyelitis by x-ray. Gas is present in the soft tissues anterior to the third through fifth amputated toes. Electronically Signed   By: Aletta Edouard M.D.   On: 12/29/2016 20:40       Assessment/Plan Hyperlipidemia lipid control important in reducing the progression of atherosclerotic disease. Continue statin therapy   Type 2 diabetes mellitus treated with insulin (HCC) blood glucose control important in reducing the progression of atherosclerotic disease. Also, involved in wound healing. On appropriate medications.   Essential hypertension blood pressure control important in reducing the progression of atherosclerotic disease. On appropriate oral medications.  A-fib (HCC) On anticoagulation. Rapid A. fib lead to a prolonged hospitalization last time.  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  I have recommended to him after his last angiogram as well as in the hospital that he consider left below-knee amputation as I do not believe he has enough salvageable foot to bear weight on at this point. In addition, his pain is so significant that without removal of the foot, I do not think his pain will be relieved. It appears that he and his family have now come to this determination as well, and they are agreeable to proceed with left below-knee amputation. This is a very difficult and involved situation and he does have medical comorbidities which are severe and significant. He will be scheduled for left below-knee amputation in the near future at his convenience.    Leotis Pain, MD  01/05/2017 2:14 PM    This note was created with Dragon medical transcription system.  Any errors from dictation are purely unintentional

## 2017-01-08 ENCOUNTER — Inpatient Hospital Stay
Admission: EM | Admit: 2017-01-08 | Discharge: 2017-01-19 | DRG: 239 | Disposition: A | Discharge status: Skilled Nursing Facility | Payer: Medicare Other | Attending: Specialist | Admitting: Specialist

## 2017-01-08 ENCOUNTER — Encounter: Payer: Self-pay | Admitting: Emergency Medicine

## 2017-01-08 ENCOUNTER — Emergency Department: Payer: Medicare Other

## 2017-01-08 DIAGNOSIS — Z91040 Latex allergy status: Secondary | ICD-10-CM | POA: Diagnosis not present

## 2017-01-08 DIAGNOSIS — L89312 Pressure ulcer of right buttock, stage 2: Secondary | ICD-10-CM | POA: Diagnosis present

## 2017-01-08 DIAGNOSIS — M24562 Contracture, left knee: Secondary | ICD-10-CM | POA: Diagnosis present

## 2017-01-08 DIAGNOSIS — R0902 Hypoxemia: Secondary | ICD-10-CM | POA: Diagnosis present

## 2017-01-08 DIAGNOSIS — T402X5A Adverse effect of other opioids, initial encounter: Secondary | ICD-10-CM | POA: Diagnosis not present

## 2017-01-08 DIAGNOSIS — Y92230 Patient room in hospital as the place of occurrence of the external cause: Secondary | ICD-10-CM | POA: Diagnosis not present

## 2017-01-08 DIAGNOSIS — Z888 Allergy status to other drugs, medicaments and biological substances status: Secondary | ICD-10-CM | POA: Diagnosis not present

## 2017-01-08 DIAGNOSIS — Z8546 Personal history of malignant neoplasm of prostate: Secondary | ICD-10-CM | POA: Diagnosis not present

## 2017-01-08 DIAGNOSIS — G92 Toxic encephalopathy: Secondary | ICD-10-CM | POA: Diagnosis not present

## 2017-01-08 DIAGNOSIS — Z91041 Radiographic dye allergy status: Secondary | ICD-10-CM

## 2017-01-08 DIAGNOSIS — Z794 Long term (current) use of insulin: Secondary | ICD-10-CM

## 2017-01-08 DIAGNOSIS — I4891 Unspecified atrial fibrillation: Secondary | ICD-10-CM

## 2017-01-08 DIAGNOSIS — Z833 Family history of diabetes mellitus: Secondary | ICD-10-CM

## 2017-01-08 DIAGNOSIS — I110 Hypertensive heart disease with heart failure: Secondary | ICD-10-CM | POA: Diagnosis present

## 2017-01-08 DIAGNOSIS — L8990 Pressure ulcer of unspecified site, unspecified stage: Secondary | ICD-10-CM | POA: Insufficient documentation

## 2017-01-08 DIAGNOSIS — E872 Acidosis: Secondary | ICD-10-CM | POA: Diagnosis present

## 2017-01-08 DIAGNOSIS — K219 Gastro-esophageal reflux disease without esophagitis: Secondary | ICD-10-CM | POA: Diagnosis present

## 2017-01-08 DIAGNOSIS — E119 Type 2 diabetes mellitus without complications: Secondary | ICD-10-CM

## 2017-01-08 DIAGNOSIS — I5031 Acute diastolic (congestive) heart failure: Secondary | ICD-10-CM | POA: Diagnosis present

## 2017-01-08 DIAGNOSIS — E785 Hyperlipidemia, unspecified: Secondary | ICD-10-CM | POA: Diagnosis present

## 2017-01-08 DIAGNOSIS — E1152 Type 2 diabetes mellitus with diabetic peripheral angiopathy with gangrene: Secondary | ICD-10-CM | POA: Diagnosis present

## 2017-01-08 DIAGNOSIS — Z8249 Family history of ischemic heart disease and other diseases of the circulatory system: Secondary | ICD-10-CM

## 2017-01-08 DIAGNOSIS — I482 Chronic atrial fibrillation: Secondary | ICD-10-CM | POA: Diagnosis present

## 2017-01-08 DIAGNOSIS — L89322 Pressure ulcer of left buttock, stage 2: Secondary | ICD-10-CM | POA: Diagnosis present

## 2017-01-08 DIAGNOSIS — Z7984 Long term (current) use of oral hypoglycemic drugs: Secondary | ICD-10-CM | POA: Diagnosis not present

## 2017-01-08 DIAGNOSIS — Z87891 Personal history of nicotine dependence: Secondary | ICD-10-CM | POA: Diagnosis not present

## 2017-01-08 DIAGNOSIS — I70262 Atherosclerosis of native arteries of extremities with gangrene, left leg: Secondary | ICD-10-CM | POA: Diagnosis not present

## 2017-01-08 DIAGNOSIS — E876 Hypokalemia: Secondary | ICD-10-CM | POA: Diagnosis not present

## 2017-01-08 DIAGNOSIS — M79605 Pain in left leg: Secondary | ICD-10-CM | POA: Diagnosis not present

## 2017-01-08 DIAGNOSIS — I071 Rheumatic tricuspid insufficiency: Secondary | ICD-10-CM | POA: Diagnosis present

## 2017-01-08 DIAGNOSIS — Z7901 Long term (current) use of anticoagulants: Secondary | ICD-10-CM

## 2017-01-08 DIAGNOSIS — I10 Essential (primary) hypertension: Secondary | ICD-10-CM

## 2017-01-08 DIAGNOSIS — I679 Cerebrovascular disease, unspecified: Secondary | ICD-10-CM | POA: Diagnosis present

## 2017-01-08 DIAGNOSIS — R410 Disorientation, unspecified: Secondary | ICD-10-CM

## 2017-01-08 DIAGNOSIS — Z79899 Other long term (current) drug therapy: Secondary | ICD-10-CM

## 2017-01-08 DIAGNOSIS — I96 Gangrene, not elsewhere classified: Secondary | ICD-10-CM | POA: Diagnosis present

## 2017-01-08 DIAGNOSIS — I509 Heart failure, unspecified: Secondary | ICD-10-CM

## 2017-01-08 LAB — CBC WITH DIFFERENTIAL/PLATELET
Basophils Absolute: 0.1 10*3/uL (ref 0–0.1)
Basophils Relative: 1 %
Eosinophils Absolute: 0.2 10*3/uL (ref 0–0.7)
Eosinophils Relative: 1 %
HEMATOCRIT: 35.6 % — AB (ref 40.0–52.0)
Hemoglobin: 12 g/dL — ABNORMAL LOW (ref 13.0–18.0)
LYMPHS ABS: 1.1 10*3/uL (ref 1.0–3.6)
LYMPHS PCT: 8 %
MCH: 28.1 pg (ref 26.0–34.0)
MCHC: 33.7 g/dL (ref 32.0–36.0)
MCV: 83.2 fL (ref 80.0–100.0)
MONO ABS: 0.8 10*3/uL (ref 0.2–1.0)
MONOS PCT: 5 %
NEUTROS ABS: 11.9 10*3/uL — AB (ref 1.4–6.5)
Neutrophils Relative %: 85 %
Platelets: 354 10*3/uL (ref 150–440)
RBC: 4.28 MIL/uL — ABNORMAL LOW (ref 4.40–5.90)
RDW: 16.9 % — AB (ref 11.5–14.5)
WBC: 14.1 10*3/uL — ABNORMAL HIGH (ref 3.8–10.6)

## 2017-01-08 LAB — GLUCOSE, CAPILLARY
Glucose-Capillary: 159 mg/dL — ABNORMAL HIGH (ref 65–99)
Glucose-Capillary: 145 mg/dL — ABNORMAL HIGH (ref 65–99)

## 2017-01-08 LAB — COMPREHENSIVE METABOLIC PANEL
ALT: 19 U/L (ref 17–63)
AST: 22 U/L (ref 15–41)
Albumin: 2.8 g/dL — ABNORMAL LOW (ref 3.5–5.0)
Alkaline Phosphatase: 162 U/L — ABNORMAL HIGH (ref 38–126)
Anion gap: 10 (ref 5–15)
BILIRUBIN TOTAL: 1 mg/dL (ref 0.3–1.2)
BUN: 19 mg/dL (ref 6–20)
CO2: 25 mmol/L (ref 22–32)
Calcium: 8.5 mg/dL — ABNORMAL LOW (ref 8.9–10.3)
Chloride: 104 mmol/L (ref 101–111)
Creatinine, Ser: 0.84 mg/dL (ref 0.61–1.24)
GFR calc Af Amer: >60 mL/min (ref >60)
GFR calc non Af Amer: >60 mL/min (ref >60)
GLUCOSE: 149 mg/dL — AB (ref 65–99)
POTASSIUM: 4.1 mmol/L (ref 3.5–5.1)
Sodium: 139 mmol/L (ref 135–145)
TOTAL PROTEIN: 6.8 g/dL (ref 6.5–8.1)

## 2017-01-08 LAB — BRAIN NATRIURETIC PEPTIDE: B NATRIURETIC PEPTIDE 5: 992 pg/mL — AB (ref 0.0–100.0)

## 2017-01-08 LAB — URINALYSIS, COMPLETE (UACMP) WITH MICROSCOPIC
BACTERIA UA: NONE SEEN
Bilirubin Urine: NEGATIVE
Glucose, UA: >=500 mg/dL — AB
Hgb urine dipstick: NEGATIVE
Ketones, ur: NEGATIVE mg/dL
Leukocytes, UA: NEGATIVE
Nitrite: NEGATIVE
PROTEIN: 30 mg/dL — AB
SPECIFIC GRAVITY, URINE: 1.02 (ref 1.005–1.030)
pH: 6 (ref 5.0–8.0)

## 2017-01-08 LAB — PROTIME-INR
INR: 1.52
Prothrombin Time: 18.5 seconds — ABNORMAL HIGH (ref 11.4–15.2)

## 2017-01-08 LAB — LACTIC ACID, PLASMA: Lactic Acid, Venous: 1.9 mmol/L (ref 0.5–1.9)

## 2017-01-08 MED ORDER — FENTANYL CITRATE (PF) 100 MCG/2ML IJ SOLN
25.0000 ug | Freq: Once | INTRAMUSCULAR | Status: AC
  Administered 2017-01-08: 25 ug via INTRAVENOUS
  Filled 2017-01-08: qty 2

## 2017-01-08 MED ORDER — INSULIN GLARGINE 100 UNIT/ML ~~LOC~~ SOLN
10.0000 [IU] | Freq: Every day | SUBCUTANEOUS | Status: DC
  Administered 2017-01-08 – 2017-01-18 (×9): 10 [IU] via SUBCUTANEOUS
  Filled 2017-01-08 (×12): qty 0.1

## 2017-01-08 MED ORDER — HYDROCODONE-ACETAMINOPHEN 5-325 MG PO TABS
1.0000 | ORAL_TABLET | Freq: Four times a day (QID) | ORAL | Status: DC | PRN
  Administered 2017-01-08 – 2017-01-12 (×6): 1 via ORAL
  Filled 2017-01-08 (×6): qty 1

## 2017-01-08 MED ORDER — ONDANSETRON HCL 4 MG/2ML IJ SOLN
4.0000 mg | Freq: Four times a day (QID) | INTRAMUSCULAR | Status: DC | PRN
  Administered 2017-01-11: 4 mg via INTRAVENOUS
  Filled 2017-01-08: qty 2

## 2017-01-08 MED ORDER — VANCOMYCIN HCL IN DEXTROSE 1-5 GM/200ML-% IV SOLN
1000.0000 mg | Freq: Once | INTRAVENOUS | Status: AC
  Administered 2017-01-08: 1000 mg via INTRAVENOUS
  Filled 2017-01-08: qty 200

## 2017-01-08 MED ORDER — ONDANSETRON HCL 4 MG PO TABS: 4.0000 mg | ORAL_TABLET | Freq: Four times a day (QID) | ORAL | Status: DC | PRN

## 2017-01-08 MED ORDER — ZINC OXIDE 40 % EX OINT
TOPICAL_OINTMENT | Freq: Two times a day (BID) | CUTANEOUS | Status: DC
  Administered 2017-01-08 – 2017-01-09 (×3): via TOPICAL
  Filled 2017-01-08: qty 114

## 2017-01-08 MED ORDER — ACETAMINOPHEN 650 MG RE SUPP: 650.0000 mg | Freq: Four times a day (QID) | RECTAL | Status: DC | PRN

## 2017-01-08 MED ORDER — METOPROLOL TARTRATE 5 MG/5ML IV SOLN
5.0000 mg | INTRAVENOUS | Status: DC | PRN
  Administered 2017-01-08 – 2017-01-15 (×8): 5 mg via INTRAVENOUS
  Filled 2017-01-08 (×8): qty 5

## 2017-01-08 MED ORDER — INSULIN ASPART 100 UNIT/ML ~~LOC~~ SOLN
0.0000 [IU] | Freq: Every day | SUBCUTANEOUS | Status: DC
  Administered 2017-01-09: 3 [IU] via SUBCUTANEOUS
  Administered 2017-01-18: 2 [IU] via SUBCUTANEOUS
  Filled 2017-01-08 (×2): qty 1

## 2017-01-08 MED ORDER — ATORVASTATIN CALCIUM 20 MG PO TABS
80.0000 mg | ORAL_TABLET | Freq: Every day | ORAL | Status: DC
  Administered 2017-01-08 – 2017-01-18 (×9): 80 mg via ORAL
  Filled 2017-01-08 (×11): qty 4

## 2017-01-08 MED ORDER — DILTIAZEM HCL ER COATED BEADS 180 MG PO CP24
300.0000 mg | ORAL_CAPSULE | Freq: Every day | ORAL | Status: DC
  Administered 2017-01-08 – 2017-01-15 (×7): 300 mg via ORAL
  Filled 2017-01-08 (×9): qty 1

## 2017-01-08 MED ORDER — METOPROLOL SUCCINATE ER 100 MG PO TB24
100.0000 mg | ORAL_TABLET | Freq: Every day | ORAL | Status: DC
  Administered 2017-01-08 – 2017-01-19 (×11): 100 mg via ORAL
  Filled 2017-01-08 (×6): qty 1
  Filled 2017-01-08: qty 2
  Filled 2017-01-08: qty 1
  Filled 2017-01-08 (×3): qty 2

## 2017-01-08 MED ORDER — ACETAMINOPHEN 325 MG PO TABS: 650.0000 mg | ORAL_TABLET | Freq: Four times a day (QID) | ORAL | Status: DC | PRN

## 2017-01-08 MED ORDER — VANCOMYCIN HCL IN DEXTROSE 1-5 GM/200ML-% IV SOLN
1000.0000 mg | Freq: Two times a day (BID) | INTRAVENOUS | Status: DC
  Administered 2017-01-08 – 2017-01-12 (×8): 1000 mg via INTRAVENOUS
  Filled 2017-01-08 (×11): qty 200

## 2017-01-08 MED ORDER — FUROSEMIDE 10 MG/ML IJ SOLN
20.0000 mg | Freq: Two times a day (BID) | INTRAMUSCULAR | Status: DC
  Administered 2017-01-09 – 2017-01-13 (×8): 20 mg via INTRAVENOUS
  Filled 2017-01-08 (×8): qty 2

## 2017-01-08 MED ORDER — FUROSEMIDE 10 MG/ML IJ SOLN
40.0000 mg | Freq: Once | INTRAMUSCULAR | Status: AC
  Administered 2017-01-08: 40 mg via INTRAVENOUS
  Filled 2017-01-08: qty 4

## 2017-01-08 MED ORDER — MORPHINE SULFATE (PF) 2 MG/ML IV SOLN
1.0000 mg | INTRAVENOUS | Status: DC | PRN
  Administered 2017-01-08 – 2017-01-12 (×6): 1 mg via INTRAVENOUS
  Filled 2017-01-08 (×5): qty 1

## 2017-01-08 MED ORDER — VERAPAMIL HCL 2.5 MG/ML IV SOLN
2.5000 mg | Freq: Once | INTRAVENOUS | Status: AC
  Administered 2017-01-08: 2.5 mg via INTRAVENOUS
  Filled 2017-01-08: qty 1

## 2017-01-08 MED ORDER — INSULIN ASPART 100 UNIT/ML ~~LOC~~ SOLN
0.0000 [IU] | Freq: Three times a day (TID) | SUBCUTANEOUS | Status: DC
  Administered 2017-01-08 – 2017-01-12 (×6): 2 [IU] via SUBCUTANEOUS
  Administered 2017-01-12: 3 [IU] via SUBCUTANEOUS
  Administered 2017-01-13 – 2017-01-14 (×2): 2 [IU] via SUBCUTANEOUS
  Administered 2017-01-14 – 2017-01-15 (×3): 1 [IU] via SUBCUTANEOUS
  Administered 2017-01-15: 3 [IU] via SUBCUTANEOUS
  Administered 2017-01-16: 2 [IU] via SUBCUTANEOUS
  Administered 2017-01-16 (×2): 1 [IU] via SUBCUTANEOUS
  Administered 2017-01-18 – 2017-01-19 (×2): 2 [IU] via SUBCUTANEOUS
  Filled 2017-01-08 (×19): qty 1

## 2017-01-08 MED ORDER — DEXTROSE 5 % IV SOLN
1.0000 g | Freq: Three times a day (TID) | INTRAVENOUS | Status: DC
  Administered 2017-01-08 – 2017-01-13 (×14): 1 g via INTRAVENOUS
  Filled 2017-01-08 (×17): qty 1

## 2017-01-08 NOTE — ED Triage Notes (Signed)
[facility] was dispatched to patient's residence in reference to hypoglycemic event. Upon arrival, patient was noted to be altered with SPO2 of 84% on room air. Patient has recent amputation of three toes and scheduled for total foot amputation in near future. On ABX at home for same.  CBG 140 as reported by EMS

## 2017-01-08 NOTE — Consult Note (Signed)
[facility] Cardiology  CARDIOLOGY CONSULT NOTE  Patient ID: [name] MRN: [id number] DOB/AGE: 02-04-1935 82 y.o.  Admit date: 01/08/2017 Referring Physician Leslye Peer Primary Physician [place]uma Primary Cardiologist Nehemiah Massed Reason for Consultation Atrial fibrillation  HPI: 82 year old gentleman referred for evaluation of atrial fibrillation. Patient brought to [facility] emergency room with decreased mental status and wheezing. She is status post recent amputation of 3 toes on left foot. She noted to have gangrene of left foot and is tentatively scheduled for amputation on 01/10/2017. Patient is tachycardic, EKG reveals atrial fibrillation with a rapid ventricular rate. The patient has known history of chronic atrial fibrillation. The patient denies chest pain or palpitations. Chest x-ray reveals mild pulmonary edema. 2-D echocardiogram 11/20/2015 revealed normal left ventricular function, with LVEF greater than 55%, with moderate tricuspid regurgitation.  Review of systems complete and found to be negative unless listed above     Past Medical History:  Diagnosis Date  . Arthritis   . Atrial fibrillation ([place])   . Carcinoma of prostate ([place])   . CHF (congestive heart failure) ([place])   . Chronic kidney disease   . Diabetes mellitus without complication ([place])   . ED (erectile dysfunction)   . Frequent urination   . Hyperlipidemia   . Hypertension   . Moderate mitral insufficiency   . Peripheral vascular disease ([place])   . Pneumonia 04/2016  . Prostate cancer ([place])   . Sleep apnea    OSA--USE C-PAP  . Ulcer of left foot due to type 2 diabetes mellitus ([place])   . Urinary stress incontinence, male     Past Surgical History:  Procedure Laterality Date  . APLIGRAFT PLACEMENT Left 10/15/2016   Procedure: APLIGRAFT PLACEMENT;  Surgeon: Albertine Patricia, DPM;  Location: [facility] ORS;  Service: Podiatry;  Laterality: Left;  necrotic ulcer  . CHOLECYSTECTOMY    . EYE SURGERY Bilateral    Cataract  Extraction with IOL  . IRRIGATION AND DEBRIDEMENT FOOT Left 10/15/2016   Procedure: IRRIGATION AND DEBRIDEMENT FOOT-EXCISIONAL DEBRIDEMENT OF SKIN 3RD, 4TH AND 5TH TOES WITH APPLICATION OF APLIGRAFT;  Surgeon: Albertine Patricia, DPM;  Location: [facility] ORS;  Service: Podiatry;  Laterality: Left;  necrotic,gangrene  . IRRIGATION AND DEBRIDEMENT FOOT Left 12/09/2016   Procedure: IRRIGATION AND DEBRIDEMENT FOOT-MUSCLE/FASCIA, RESECTION OF FOURTH AND FIFTH METATARSAL NECROTIC BONE;  Surgeon: Albertine Patricia, DPM;  Location: [facility] ORS;  Service: Podiatry;  Laterality: Left;  . LOWER EXTREMITY ANGIOGRAPHY Left 08/16/2016   Procedure: Lower Extremity Angiography;  Surgeon: Algernon Huxley, MD;  Location: [place] CV LAB;  Service: Cardiovascular;  Laterality: Left;  . LOWER EXTREMITY ANGIOGRAPHY Left 09/01/2016   Procedure: Lower Extremity Angiography;  Surgeon: Algernon Huxley, MD;  Location: [place] CV LAB;  Service: Cardiovascular;  Laterality: Left;  . LOWER EXTREMITY ANGIOGRAPHY Left 10/14/2016   Procedure: Lower Extremity Angiography;  Surgeon: Algernon Huxley, MD;  Location: [place] CV LAB;  Service: Cardiovascular;  Laterality: Left;  . LOWER EXTREMITY ANGIOGRAPHY Left 12/20/2016   Procedure: Lower Extremity Angiography;  Surgeon: Algernon Huxley, MD;  Location: [place] CV LAB;  Service: Cardiovascular;  Laterality: Left;  . LOWER EXTREMITY INTERVENTION  09/01/2016   Procedure: Lower Extremity Intervention;  Surgeon: Algernon Huxley, MD;  Location: [place] CV LAB;  Service: Cardiovascular;;  . PROSTATE SURGERY     removal  . TONSILLECTOMY     as a child    Prescriptions Prior to Admission  Medication Sig Dispense Refill Last Dose  . atorvastatin (LIPITOR) 80  MG tablet Take 80 mg by mouth daily at 6 PM.    01/07/2017 at 1800  . diltiazem (CARDIZEM CD) 300 MG 24 hr capsule Take 300 mg by mouth daily.   01/07/2017 at 0800  . furosemide (LASIX) 20 MG tablet TAKE 1 TABLET DAILY   01/07/2017 at 0800   . JARDIANCE 10 MG TABS tablet Take 10 mg by mouth daily.   01/07/2017 at 0800  . metFORMIN (GLUCOPHAGE) 1000 MG tablet TAKE 1 TABLET TWICE A DAY WITH MEALS   01/07/2017 at 1800  . TOPROL XL 100 MG 24 hr tablet Take 100 mg by mouth daily.   01/07/2017 at 0800  . TRULICITY 9.51 OA/4.1YS SOPN 0.75 mg once a week.   Past Week at Unknown time  . warfarin (COUMADIN) 5 MG tablet Take 5 mg by mouth daily.   01/07/2017 at 0800  . acetaminophen (TYLENOL) 500 MG tablet Take 500 mg by mouth every 6 (six) hours as needed for moderate pain.   prn at prn  . aspirin EC 81 MG tablet Take 1 tablet (81 mg total) by mouth daily. (Patient not taking: Reported on 01/08/2017) 150 tablet 2 Not Taking at Unknown time  . HYDROcodone-acetaminophen (NORCO) 5-325 MG tablet Take 1 tablet by mouth every 6 (six) hours as needed for moderate pain. (Patient not taking: Reported on 01/08/2017) 30 tablet 0 Not Taking at Unknown time  . insulin glargine (LANTUS) 100 UNIT/ML injection Inject 0.24 mLs (24 Units total) into the skin at bedtime. (Patient not taking: Reported on 01/08/2017) 10 mL 11 Not Taking at Unknown time   Social History   Social History  . Marital status: Married    Spouse name: N/A  . Number of children: N/A  . Years of education: N/A   Occupational History  . retired    Social History Main Topics  . Smoking status: Former Smoker    Packs/day: 0.25    Types: Cigarettes    Quit date: 10/14/1994  . Smokeless tobacco: Never Used  . Alcohol use No  . Drug use: No  . Sexual activity: Not on file   Other Topics Concern  . Not on file   Social History Narrative  . No narrative on file    Family History  Problem Relation Age of Onset  . Hypertension Mother   . Diabetes Mother   . Prostate cancer Neg Hx   . Bladder Cancer Neg Hx   . Kidney disease Neg Hx       Review of systems complete and found to be negative unless listed above      PHYSICAL EXAM  General: Well developed, well nourished, in  no acute distress HEENT:  Normocephalic and atramatic Neck:  No JVD.  Lungs: Clear bilaterally to auscultation and percussion. Heart: HRRR . Normal S1 and S2 without gallops or murmurs.  Abdomen: Bowel sounds are positive, abdomen soft and non-tender  Msk:  Back normal, normal gait. Normal strength and tone for age. Extremities: No clubbing, cyanosis or edema.   Neuro: Alert and oriented X 3. Psych:  Good affect, responds appropriately  Labs:   Lab Results  Component Value Date   WBC 14.1 (H) 01/08/2017   HGB 12.0 (L) 01/08/2017   HCT 35.6 (L) 01/08/2017   MCV 83.2 01/08/2017   PLT 354 01/08/2017    Recent Labs Lab 01/08/17 1001  NA 139  K 4.1  CL 104  CO2 25  BUN 19  CREATININE 0.84  CALCIUM 8.5*  PROT 6.8  BILITOT 1.0  ALKPHOS 162*  ALT 19  AST 22  GLUCOSE 149*   Lab Results  Component Value Date   CKTOTAL 131 10/22/2016   CKMB 1.2 01/19/2013   TROPONINI <0.03 10/21/2016    Lab Results  Component Value Date   CHOL 100 10/22/2016   Lab Results  Component Value Date   HDL 28 (L) 10/22/2016   Lab Results  Component Value Date   LDLCALC 56 10/22/2016   Lab Results  Component Value Date   TRIG 78 10/22/2016   Lab Results  Component Value Date   CHOLHDL 3.6 10/22/2016   No results found for: LDLDIRECT    Radiology: Dg Chest 2 View  Result Date: 01/08/2017 CLINICAL DATA:  Hypoglycemic event.  Decreased O2 levels. EXAM: CHEST  2 VIEW COMPARISON:  December 29, 2016 FINDINGS: Mild cardiomegaly. Mild pulmonary venous congestion without overt edema. Small left effusion with underlying atelectasis. No other infiltrate identified. No other acute abnormalities. IMPRESSION: Pulmonary venous congestion. Small left effusion with underlying atelectasis. Electronically Signed   By: Dorise Bullion III M.D   On: 01/08/2017 10:27   Dg Chest 2 View  Result Date: 12/29/2016 CLINICAL DATA:  Diabetic foot wound. EXAM: CHEST  2 VIEW COMPARISON:  06/03/2016 FINDINGS: Stable  top-normal heart size an thoracic aortic atherosclerosis. Opacity at the left lung base is favored to represent atelectasis. Early pneumonia cannot be excluded. There does appear to be a small amount of pleural fluid bilaterally. No overt edema. No pneumothorax or nodule identified. The visualized skeletal structures are unremarkable. IMPRESSION: Left basilar opacity favored to represent atelectasis. Early pneumonia is not excluded. Small pleural effusions are present bilaterally. Electronically Signed   By: Aletta Edouard M.D.   On: 12/29/2016 17:57   Dg Foot Complete Left  Result Date: 12/29/2016 CLINICAL DATA:  Infected left foot wound with prior toe amputation. EXAM: LEFT FOOT - COMPLETE 3+ VIEW COMPARISON:  10/21/2016 FINDINGS: Since the prior radiograph, there has been further amputation of fourth and fifth metatarsals at the level of their mid shafts. There is some visible gas in the soft tissues anterior to the amputated third, fourth and fifth toes which may be on the basis of wound infection. No obvious bony destruction to suggest osteomyelitis. Vascular calcifications of digital arteries. IMPRESSION: No evidence of osteomyelitis by x-ray. Gas is present in the soft tissues anterior to the third through fifth amputated toes. Electronically Signed   By: Aletta Edouard M.D.   On: 12/29/2016 20:40    EKG: Atrial fibrillation with rapid ventricular rate  ASSESSMENT AND PLAN:   1. Chronic atrial fibrillation, currently with rapid ventricular rate, likely compensatory for gangrene of left foot, appears asymptomatic and hemodynamically stable 2. Gangrene left foot 3. Acute diastolic CHF  Recommendations  1. Agree with overall current therapy 2. May uptitrate to metoprolol succinate as needed 3. Continue diuresis 4. Carefully monitor renal status  Signed: Isaias Cowman MD,PhD, [facility] 01/08/2017, 5:09 PM

## 2017-01-08 NOTE — Consult Note (Signed)
[facility] Vascular Consult Note  MRN : [id number]  [name] is a 82 y.o. (09-20-1934) male who presents with chief complaint of  Chief Complaint  Patient presents with  . Altered Mental Status  .  History of Present Illness: I am asked by Dr. Leslye Peer to see the patient regarding left foot gangrene.  He presented to the hospital today because of continued and worsening pain in the left foot.  He was seen in the office last week and left BKA was recommended and he and his family seemed agreeable with proceeding with that.  It was on the schedule for next week.  Today, his foot actually has less redness than it did in the office last week.  His WBC is slightly lower than his last hospitalization.  He is in rapid atrial fibrillation which has been a recurring problem for him for months now. The hospital service are admitting him to try to optimize his cardiac status and expedite his amputation. His pain is intractable. It is 10 out of 10 essentially 24 hours a day. The narcotic use has hurt his mental status over the past several weeks as well. No fevers or chills today although he has had some recently.  Current Facility-Administered Medications  Medication Dose Route Frequency Provider Last Rate Last Dose  . acetaminophen (TYLENOL) tablet 650 mg  650 mg Oral Q6H PRN Loletha Grayer, MD       Or  . acetaminophen (TYLENOL) suppository 650 mg  650 mg Rectal Q6H PRN Loletha Grayer, MD      . atorvastatin (LIPITOR) tablet 80 mg  80 mg Oral q1800 Wieting, Richard, MD      . cefTAZidime (FORTAZ) 1 g in dextrose 5 % 50 mL IVPB  1 g Intravenous Q8H Lifsey, Hannah C, RPH      . diltiazem (CARDIZEM CD) 24 hr capsule 300 mg  300 mg Oral Daily Loletha Grayer, MD      . Derrill Memo ON 01/09/2017] furosemide (LASIX) injection 20 mg  20 mg Intravenous BID Wieting, Richard, MD      . HYDROcodone-acetaminophen (NORCO/VICODIN) 5-325 MG per tablet 1 tablet  1 tablet Oral Q6H PRN  Wieting, Richard, MD      . insulin aspart (novoLOG) injection 0-5 Units  0-5 Units Subcutaneous QHS Wieting, Richard, MD      . insulin aspart (novoLOG) injection 0-9 Units  0-9 Units Subcutaneous TID WC Wieting, Richard, MD      . insulin glargine (LANTUS) injection 10 Units  10 Units Subcutaneous QHS Wieting, Richard, MD      . liver oil-zinc oxide (DESITIN) 40 % ointment   Topical BID Wieting, Richard, MD      . metoprolol succinate (TOPROL-XL) 24 hr tablet 100 mg  100 mg Oral Daily Wieting, Richard, MD      . ondansetron [facility]) tablet 4 mg  4 mg Oral Q6H PRN Wieting, Richard, MD       Or  . ondansetron (ZOFRAN) injection 4 mg  4 mg Intravenous Q6H PRN Wieting, Richard, MD      . vancomycin (VANCOCIN) IVPB 1000 mg/200 mL premix  1,000 mg Intravenous Once Lifsey, Betti Cruz, [place]        Past Medical History:  Diagnosis Date  . Arthritis   . Atrial fibrillation ([place])   . Carcinoma of prostate ([place])   . CHF (congestive heart failure) ([place])   . Chronic kidney disease   . Diabetes mellitus without complication ([place])   .  ED (erectile dysfunction)   . Frequent urination   . Hyperlipidemia   . Hypertension   . Moderate mitral insufficiency   . Peripheral vascular disease ([place])   . Pneumonia 04/2016  . Prostate cancer ([place])   . Sleep apnea    OSA--USE C-PAP  . Ulcer of left foot due to type 2 diabetes mellitus ([place])   . Urinary stress incontinence, male     Past Surgical History:  Procedure Laterality Date  . APLIGRAFT PLACEMENT Left 10/15/2016   Procedure: APLIGRAFT PLACEMENT;  Surgeon: Albertine Patricia, DPM;  Location: [facility] ORS;  Service: Podiatry;  Laterality: Left;  necrotic ulcer  . CHOLECYSTECTOMY    . EYE SURGERY Bilateral    Cataract Extraction with IOL  . IRRIGATION AND DEBRIDEMENT FOOT Left 10/15/2016   Procedure: IRRIGATION AND DEBRIDEMENT FOOT-EXCISIONAL DEBRIDEMENT OF SKIN 3RD, 4TH AND 5TH TOES WITH APPLICATION OF APLIGRAFT;  Surgeon: Albertine Patricia, DPM;  Location:  [facility] ORS;  Service: Podiatry;  Laterality: Left;  necrotic,gangrene  . IRRIGATION AND DEBRIDEMENT FOOT Left 12/09/2016   Procedure: IRRIGATION AND DEBRIDEMENT FOOT-MUSCLE/FASCIA, RESECTION OF FOURTH AND FIFTH METATARSAL NECROTIC BONE;  Surgeon: Albertine Patricia, DPM;  Location: [facility] ORS;  Service: Podiatry;  Laterality: Left;  . LOWER EXTREMITY ANGIOGRAPHY Left 08/16/2016   Procedure: Lower Extremity Angiography;  Surgeon: Algernon Huxley, MD;  Location: [place] [facility];  Service: Cardiovascular;  Laterality: Left;  . LOWER EXTREMITY ANGIOGRAPHY Left 09/01/2016   Procedure: Lower Extremity Angiography;  Surgeon: Algernon Huxley, MD;  Location: [place] [facility];  Service: Cardiovascular;  Laterality: Left;  . LOWER EXTREMITY ANGIOGRAPHY Left 10/14/2016   Procedure: Lower Extremity Angiography;  Surgeon: Algernon Huxley, MD;  Location: [place] [facility];  Service: Cardiovascular;  Laterality: Left;  . LOWER EXTREMITY ANGIOGRAPHY Left 12/20/2016   Procedure: Lower Extremity Angiography;  Surgeon: Algernon Huxley, MD;  Location: [place] [facility];  Service: Cardiovascular;  Laterality: Left;  . LOWER EXTREMITY INTERVENTION  09/01/2016   Procedure: Lower Extremity Intervention;  Surgeon: Algernon Huxley, MD;  Location: [place] [facility];  Service: Cardiovascular;;  . PROSTATE SURGERY     removal  . TONSILLECTOMY     as a child     Social History      Social History  Substance Use Topics  . Smoking status: Former Smoker    Packs/day: 0.25    Types: Cigarettes    Quit date: 10/14/1994  . Smokeless tobacco: Never Used  . Alcohol use No  married  Family History      Family History  Problem Relation Age of Onset  . Hypertension Mother   . Prostate cancer Neg Hx   . Bladder Cancer Neg Hx   . Kidney disease Neg Hx   no bleeding or clotting disorders       Allergies  Allergen Reactions  . Contrast Media [Iodinated Diagnostic Agents] Shortness Of Breath    SOB  . Iohexol  Shortness Of Breath     Desc: Respiratory Distress, Laryngedema, diaphoresis, Onset Date: [id number]   . Latex Rash     REVIEW OF SYSTEMS (Negative unless checked)  Constitutional: [] Weight loss  [x] Fever  [] Chills Cardiac: [] Chest pain   [] Chest pressure   [x] Palpitations   [] Shortness of breath when laying flat   [] Shortness of breath at rest   [x] Shortness of breath with exertion. Vascular:  [] Pain in legs with walking   [] Pain in legs at rest   [] Pain in legs when laying flat   []   Claudication   [x] Pain in feet when walking  [x] Pain in feet at rest  [x] Pain in feet when laying flat   [] History of DVT   [] Phlebitis   [] Swelling in legs   [] Varicose veins   [x] Non-healing ulcers Pulmonary:   [] Uses home oxygen   [] Productive cough   [] Hemoptysis   [] Wheeze  [] COPD   [] Asthma Neurologic:  [] Dizziness  [] Blackouts   [] Seizures   [] History of stroke   [] History of TIA  [] Aphasia   [] Temporary blindness   [] Dysphagia   [] Weakness or numbness in arms   [x] Weakness or numbness in legs Musculoskeletal:  [] Arthritis   [] Joint swelling   [x] Joint pain   [] Low back pain Hematologic:  [] Easy bruising  [] Easy bleeding   [] Hypercoagulable state   [] Anemic  [] Hepatitis Gastrointestinal:  [] Blood in stool   [] Vomiting blood  [] Gastroesophageal reflux/heartburn   [] Difficulty swallowing. Genitourinary:  [] Chronic kidney disease   [] Difficult urination  [] Frequent urination  [] Burning with urination   [] Blood in urine Skin:  [] Rashes   [x] Ulcers   [x] Wounds Psychological:  [] History of anxiety   []  History of major depression.    Physical Examination  Vitals:   01/08/17 1300 01/08/17 1338 01/08/17 1345 01/08/17 1459  BP: (!) 171/86 (!) 148/90  (!) 145/99  Pulse: 74 78 97 93  Resp: (!) 21 (!) 21 15 16   Temp:    97.9 F (36.6 C)  TempSrc:      SpO2: 100% 97% 96% 94%  Weight:      Height:       Body mass index is 29.76 kg/m. Gen:  WD/WN, NAD Head: [place]/AT, No temporalis wasting.   Ear/Nose/Throat: Hearing grossly intact, nares w/o erythema or drainage, oropharynx w/o Erythema/Exudate Eyes: Sclera non-icteric, conjunctiva clear Neck: Trachea midline.  No JVD.  Pulmonary:  Good air movement, respirations not labored, equal bilaterally.  Cardiac: irregular and tachycardic Vascular:  Vessel Right Left  Radial Palpable Palpable                      Popliteal Not Palpable 1+ Palpable  PT 1+ Palpable 1+ Palpable  DP Trace Palpable Not Palpable   Gastrointestinal: soft, non-tender/non-distended.  Musculoskeletal: M/S 5/5 throughout.  Extremities with no significant swelling today. His left foot actually has less erythema than he did last saw him in the office last week. He has a chronic draining wound with a black eschar as well as a gangrenous second toe. The first toe remains viable. The drainage is foul smelling and mildly purulent. Neurologic: Sensation grossly intact in extremities.  Symmetrical.  Speech is fluent. Motor exam as listed above. Psychiatric: Judgment and insight appear fair today. Dermatologic: left foot wound as above       CBC Lab Results  Component Value Date   WBC 14.1 (H) 01/08/2017   HGB 12.0 (L) 01/08/2017   HCT 35.6 (L) 01/08/2017   MCV 83.2 01/08/2017   PLT 354 01/08/2017    BMET    Component Value Date/Time   NA 139 01/08/2017 1001   NA 140 01/20/2013 0255   K 4.1 01/08/2017 1001   K 3.5 01/20/2013 0255   CL 104 01/08/2017 1001   CL 103 01/20/2013 0255   CO2 25 01/08/2017 1001   CO2 30 01/20/2013 0255   GLUCOSE 149 (H) 01/08/2017 1001   GLUCOSE 145 (H) 01/20/2013 0255   BUN 19 01/08/2017 1001   BUN 22 (H) 01/20/2013 0255   CREATININE 0.84 01/08/2017 1001  CREATININE 1.09 01/20/2013 0255   CALCIUM 8.5 (L) 01/08/2017 1001   CALCIUM 9.4 01/20/2013 0255   GFRNONAA >60 01/08/2017 1001   GFRNONAA >60 01/20/2013 0255   GFRAA >60 01/08/2017 1001   GFRAA >60 01/20/2013 0255   Estimated Creatinine Clearance: 74.7  mL/min (by C-G formula based on SCr of 0.84 mg/dL).  COAG Lab Results  Component Value Date   INR 5.60 (HH) 12/30/2016   INR 4.59 (HH) 12/29/2016   INR 2.57 12/20/2016    Radiology Dg Chest 2 View  Result Date: 01/08/2017 CLINICAL DATA:  Hypoglycemic event.  Decreased O2 levels. EXAM: CHEST  2 VIEW COMPARISON:  December 29, 2016 FINDINGS: Mild cardiomegaly. Mild pulmonary venous congestion without overt edema. Small left effusion with underlying atelectasis. No other infiltrate identified. No other acute abnormalities. IMPRESSION: Pulmonary venous congestion. Small left effusion with underlying atelectasis. Electronically Signed   By: Dorise Bullion III M.D   On: 01/08/2017 10:27   Dg Chest 2 View  Result Date: 12/29/2016 CLINICAL DATA:  Diabetic foot wound. EXAM: CHEST  2 VIEW COMPARISON:  06/03/2016 FINDINGS: Stable top-normal heart size an thoracic aortic atherosclerosis. Opacity at the left lung base is favored to represent atelectasis. Early pneumonia cannot be excluded. There does appear to be a small amount of pleural fluid bilaterally. No overt edema. No pneumothorax or nodule identified. The visualized skeletal structures are unremarkable. IMPRESSION: Left basilar opacity favored to represent atelectasis. Early pneumonia is not excluded. Small pleural effusions are present bilaterally. Electronically Signed   By: Aletta Edouard M.D.   On: 12/29/2016 17:57   Dg Foot Complete Left  Result Date: 12/29/2016 CLINICAL DATA:  Infected left foot wound with prior toe amputation. EXAM: LEFT FOOT - COMPLETE 3+ VIEW COMPARISON:  10/21/2016 FINDINGS: Since the prior radiograph, there has been further amputation of fourth and fifth metatarsals at the level of their mid shafts. There is some visible gas in the soft tissues anterior to the amputated third, fourth and fifth toes which may be on the basis of wound infection. No obvious bony destruction to suggest osteomyelitis. Vascular calcifications of  digital arteries. IMPRESSION: No evidence of osteomyelitis by x-ray. Gas is present in the soft tissues anterior to the third through fifth amputated toes. Electronically Signed   By: Aletta Edouard M.D.   On: 12/29/2016 20:40      Assessment/Plan 1. PAD with gangrene and infected ulceration of the left foot. I continue to recommend an amputation. The patient actually has a slightly lower white count and less erythema than when I saw him in the hospital and in the clinic recently. There is not going to be anyway to remove his pain without removing the foot I do not believe. I discussed the case with his podiatrist and now have had conversations with his family and everybody is on board with proceeding with an amputation. His INR is not back today, and he needs to have his cardiac status optimized prior to surgery. If the hospitalist believe they can get his heart under control I will try to get him on the schedule for Monday to do a left below-knee amputation. The patient is now agreeable with this. 2. Atrial fibrillation. Poorly controlled. Has been a recurring problem. Has seen cardiology in the past. Would need to have this optimized before any surgery. This may delay surgery of his cardiac status cannot be improved. 3. Diabetes. Control is important for wound healing and avoidance of progression of atherosclerosis. 4. Hypertension. Stable outpatient medications  and blood pressure control important in reducing the progression of atherosclerotic disease. On appropriate oral medications.    Leotis Pain, MD  01/08/2017 3:05 PM    This note was created with Dragon medical transcription system.  Any error is purely unintentional

## 2017-01-08 NOTE — H&P (Addendum)
[place] [facility] [place] NAME: [name]    MR#:  [id number]  DATE OF BIRTH:  04/26/1935  DATE OF ADMISSION:  01/08/2017  PRIMARY CARE PHYSICIAN: Alanson Aly, FNP   REQUESTING/REFERRING PHYSICIAN: Dr Charlotte Crumb  CHIEF COMPLAINT:   Chief Complaint  Patient presents with  . Altered Mental Status    HISTORY OF PRESENT ILLNESS:  [name]  is a 82 y.o. male brought in by wife for altered mental status and talking about strange things. She's also noticed that he has been breathing strange and wheezing. The patient states that his breathing is fine. The patient complains of pain in his left foot 8 out of 10 in intensity which is a continuous pain. He recently had amputation of 3 toes on his left foot and then developed gangrene of the second toe and of his foot afterwards. Family states that they've been talking with Dr. Lucky Cowboy about amputation of the leg. No complaints of chest pain. In the ER, he was found to be in rapid atrial fibrillation and chest x-ray showing signs of congestive heart failure.  PAST MEDICAL HISTORY:   Past Medical History:  Diagnosis Date  . Arthritis   . Atrial fibrillation ([place])   . Carcinoma of prostate ([place])   . CHF (congestive heart failure) ([place])   . Chronic kidney disease   . Diabetes mellitus without complication ([place])   . ED (erectile dysfunction)   . Frequent urination   . Hyperlipidemia   . Hypertension   . Moderate mitral insufficiency   . Peripheral vascular disease ([place])   . Pneumonia 04/2016  . Prostate cancer ([place])   . Sleep apnea    OSA--USE C-PAP  . Ulcer of left foot due to type 2 diabetes mellitus ([place])   . Urinary stress incontinence, male     PAST SURGICAL HISTORY:   Past Surgical History:  Procedure Laterality Date  . APLIGRAFT PLACEMENT Left 10/15/2016   Procedure: APLIGRAFT PLACEMENT;  Surgeon: Albertine Patricia, DPM;  Location: [facility] OR[facility];  Service: Podiatry;   Laterality: Left;  necrotic ulcer  . CHOLECYSTECTOMY    . EYE SURGERY Bilateral    Cataract Extraction with IOL  . IRRIGATION AND DEBRIDEMENT FOOT Left 10/15/2016   Procedure: IRRIGATION AND DEBRIDEMENT FOOT-EXCISIONAL DEBRIDEMENT OF SKIN 3RD, 4TH AND 5TH TOES WITH APPLICATION OF APLIGRAFT;  Surgeon: Albertine Patricia, DPM;  Location: [facility];  Service: Podiatry;  Laterality: Left;  necrotic,gangrene  . IRRIGATION AND DEBRIDEMENT FOOT Left 12/09/2016   Procedure: IRRIGATION AND DEBRIDEMENT FOOT-MUSCLE/FASCIA, RESECTION OF FOURTH AND FIFTH METATARSAL NECROTIC BONE;  Surgeon: Albertine Patricia, DPM;  Location: [facility];  Service: Podiatry;  Laterality: Left;  . LOWER EXTREMITY ANGIOGRAPHY Left 08/16/2016   Procedure: Lower Extremity Angiography;  Surgeon: Algernon Huxley, MD;  Location: [place] [facility];  Service: Cardiovascular;  Laterality: Left;  . LOWER EXTREMITY ANGIOGRAPHY Left 09/01/2016   Procedure: Lower Extremity Angiography;  Surgeon: Algernon Huxley, MD;  Location: [place] [facility];  Service: Cardiovascular;  Laterality: Left;  . LOWER EXTREMITY ANGIOGRAPHY Left 10/14/2016   Procedure: Lower Extremity Angiography;  Surgeon: Algernon Huxley, MD;  Location: [place] [facility];  Service: Cardiovascular;  Laterality: Left;  . LOWER EXTREMITY ANGIOGRAPHY Left 12/20/2016   Procedure: Lower Extremity Angiography;  Surgeon: Algernon Huxley, MD;  Location: [place] [facility];  Service: Cardiovascular;  Laterality: Left;  . LOWER EXTREMITY INTERVENTION  09/01/2016   Procedure: Lower Extremity Intervention;  Surgeon:  Algernon Huxley, MD;  Location: [place] [facility];  Service: Cardiovascular;;  . PROSTATE SURGERY     removal  . TONSILLECTOMY     as a child    SOCIAL HISTORY:   Social History  Substance Use Topics  . Smoking status: Former Smoker    Packs/day: 0.25    Types: Cigarettes    Quit date: 10/14/1994  . Smokeless tobacco: Never Used  . Alcohol use No    FAMILY HISTORY:   Family  History  Problem Relation Age of Onset  . Hypertension Mother   . Diabetes Mother   . Prostate cancer Neg Hx   . Bladder Cancer Neg Hx   . Kidney disease Neg Hx     DRUG ALLERGIES:   Allergies  Allergen Reactions  . Contrast Media [Iodinated Diagnostic Agents] Shortness Of Breath    SOB  . Iohexol Shortness Of Breath     Desc: Respiratory Distress, Laryngedema, diaphoresis, Onset Date: [id number]   . Latex Rash    REVIEW OF SYSTEMS:  CONSTITUTIONAL: No fever. Positive for weight loss. Positive for fatigue and weakness. EYES: No blurred or double vision. Wears reading glasses. EARS, NOSE, AND THROAT: No tinnitus or ear pain. No sore throat. Decreased hearing. Positive for dysphagia to medications. RESPIRATORY: No cough, as per wife he does have shortness of breath and wheezing. No hemoptysis.  CARDIOVASCULAR: No chest pain, orthopnea, edema.  GASTROINTESTINAL: No nausea, vomiting, or abdominal pain. No blood in bowel movements. Some diarrhea. GENITOURINARY: Some burning on urination. Unable to control his urine especially at night., hematuria.  ENDOCRINE: No polyuria, nocturia,  HEMATOLOGY: No anemia, easy bruising or bleeding SKIN: Positive for rash on buttock. MUSCULOSKELETAL: Positive for left foot pain. NEUROLOGIC: No tingling, numbness, weakness.  PSYCHIATRY: No anxiety or depression.   MEDICATIONS AT HOME:   Prior to Admission medications   Medication Sig Start Date End Date Taking? Authorizing Provider  atorvastatin (LIPITOR) 80 MG tablet Take 80 mg by mouth daily at 6 PM.    Yes [provider]  diltiazem (CARDIZEM CD) 300 MG 24 hr capsule Take 300 mg by mouth daily.   Yes [provider]  furosemide (LASIX) 20 MG tablet TAKE 1 TABLET DAILY 10/24/15  Yes [provider]  JARDIANCE 10 MG TABS tablet Take 10 mg by mouth daily. 11/30/16  Yes [provider]  metFORMIN (GLUCOPHAGE) 1000 MG tablet TAKE 1 TABLET TWICE A DAY WITH MEALS 11/28/15   Yes [provider]  TOPROL XL 100 MG 24 hr tablet Take 100 mg by mouth daily. 12/25/16  Yes [provider]  TRULICITY 6.54 YT/0.3TW SOPN 0.75 mg once a week. 11/14/16  Yes [provider]  warfarin (COUMADIN) 5 MG tablet Take 5 mg by mouth daily.   Yes [provider]  acetaminophen (TYLENOL) 500 MG tablet Take 500 mg by mouth every 6 (six) hours as needed for moderate pain.    [provider]  aspirin EC 81 MG tablet Take 1 tablet (81 mg total) by mouth daily. Patient not taking: Reported on 01/08/2017 08/16/16   Algernon Huxley, MD  HYDROcodone-acetaminophen (NORCO) 5-325 MG tablet Take 1 tablet by mouth every 6 (six) hours as needed for moderate pain. Patient not taking: Reported on 01/08/2017 12/20/16   Algernon Huxley, MD  insulin glargine (LANTUS) 100 UNIT/ML injection Inject 0.24 mLs (24 Units total) into the skin at bedtime. Patient not taking: Reported on 01/08/2017 09/06/16   Theodoro Grist, MD  VITAL SIGNS:  Blood pressure (!) 148/90, pulse 78, temperature 97.8 F (36.6 C), temperature source Oral, resp. rate (!) 21, height 5\' 7"  (1.702 m), weight 86.2 kg (190 lb), SpO2 97 %.  PHYSICAL EXAMINATION:  GENERAL:  82 y.o.-year-old patient lying in the bed with no acute distress.  EYES: Pupils equal, round, reactive to light and accommodation. No scleral icterus. Extraocular muscles intact.  HEENT: Head atraumatic, normocephalic. Oropharynx and nasopharynx clear.  NECK:  Supple, no jugular venous distention. No thyroid enlargement, no tenderness.  LUNGS: decreased breath sounds bilaterally, slight  wheezing, positive rales at bases. No use of accessory muscles of respiration.  CARDIOVASCULAR: S1, S2 regular regular tachycardic. No murmurs, rubs, or gallops.  ABDOMEN: Soft, nontender, nondistended. Bowel sounds present. No organomegaly or mass.  EXTREMITIES: Left foot second toe gangrene and gangrene extending onto the left foot with ulcerations and  surrounding erythema NEUROLOGIC: Cranial nerves II through XII are intact. Muscle strength 5/5 in all extremities. Sensation intact. Gait not checked.  PSYCHIATRIC: The patient is alert and oriented x 3.  SKIN: Left second toe gangrene and gangrene extending into the left foot with surrounding ulcerations and erythema. Stage 2 decubitus ulcers on bilateral buttocks.  LABORATORY PANEL:   CBC  Recent Labs Lab 01/08/17 1001  WBC 14.1*  HGB 12.0*  HCT 35.6*  PLT 354   ------------------------------------------------------------------------------------------------------------------  Chemistries   Recent Labs Lab 01/08/17 1001  NA 139  K 4.1  CL 104  CO2 25  GLUCOSE 149*  BUN 19  CREATININE 0.84  CALCIUM 8.5*  AST 22  ALT 19  ALKPHOS 162*  BILITOT 1.0   ------------------------------------------------------------------------------------------------------------------  RADIOLOGY:  Dg Chest 2 View  Result Date: 01/08/2017 CLINICAL DATA:  Hypoglycemic event.  Decreased O2 levels. EXAM: CHEST  2 VIEW COMPARISON:  December 29, 2016 FINDINGS: Mild cardiomegaly. Mild pulmonary venous congestion without overt edema. Small left effusion with underlying atelectasis. No other infiltrate identified. No other acute abnormalities. IMPRESSION: Pulmonary venous congestion. Small left effusion with underlying atelectasis. Electronically Signed   By: Dorise Bullion III M.D   On: 01/08/2017 10:27    EKG:   Atrial fibrillation rapid rate 137 bpm nonspecific ST-T wave changes  IMPRESSION AND PLAN:   1.  Clinical sepsis Gangrene left foot and second toe, leukocytosis, tachycardia. Case discussed with vascular surgery Dr. Lucky Cowboy. He will see the patient and set up for amputation. Prior to surgery, I would like to get better breath sounds in his lungs and control heart rate a little bit better. Hold Coumadin. Empiric antibiotics with ceftazidime and vancomycin 2. Rapid atrial fibrillation. Start his  usual oral medications Toprol and Cardizem CD. Monitor on telemetry. 3. Acute diastolic congestive heart failure secondary to rapid heart rate. Lasix 20 mg IV twice a day 4. Type 2 diabetes mellitus. Put on Lantus and sliding scale and hold oral medications at this time 5. History of prostate cancer 6. Peripheral vascular disease 7. Essential hypertension continue usual medications 8. Hyperlipidemia unspecified continue statin. 9. Stage 2 decubitus ulcers on bilateral buttocks- wound care consult  All the records are reviewed and case discussed with ED provider. Management plans discussed with the patient, family and they are in agreement.  CODE STATUS: Full code  TOTAL TIME TAKING CARE OF THIS PATIENT: 50 minutes.    Loletha Grayer M.D on 01/08/2017 at 1:48 PM  Between 7am to 6pm - Pager - [phone]  After 6pm call admission pager [phone]  [facility]  [phone]  CC:  Primary care physician; Alanson Aly, [place]

## 2017-01-08 NOTE — ED Provider Notes (Addendum)
[facility] Emergency Department Provider Note  ____________________________________________   I have reviewed the triage vital signs and the nursing notes.   HISTORY  Chief Complaint Altered Mental Status    HPI [name] is a 82 y.o. male with a history of A. fib, CHF, who has a known significant way necrotic foot who is scheduled for outpatient amputation in the near future, he is not exactly sure when that is, recent admission for same. Presents today after he seemed confused to his wife. According to his wife he became confused and somewhat short of breath this morning. Patient has been compliant with his medications. No fever. Patient is awake and alert at this time, EMS did find him hypoxic and they put him on oxygen and apparently he has been improving ever since. He denies any chest pain shortness of breath or nausea. However his wife states he has been having increased shortness of breath over the last day or so.  He denies chest pain. He has no history of blood clot that he knows of and he has not had any leg swelling.   Past Medical History:  Diagnosis Date  . Arthritis   . Atrial fibrillation ([place])   . Carcinoma of prostate ([place])   . CHF (congestive heart failure) ([place])   . Chronic kidney disease   . Diabetes mellitus without complication ([place])   . ED (erectile dysfunction)   . Frequent urination   . Hyperlipidemia   . Hypertension   . Moderate mitral insufficiency   . Peripheral vascular disease ([place])   . Pneumonia 04/2016  . Prostate cancer ([place])   . Sleep apnea    OSA--USE C-PAP  . Ulcer of left foot due to type 2 diabetes mellitus ([place])   . Urinary stress incontinence, male     Patient Active Problem List   Diagnosis Date Noted  . 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 Diabetic foot ulcer ([place]) 12/29/2016  . Sepsis ([place]) 12/29/2016  . Ataxia 10/21/2016  . History of femoral angiogram  09/06/2016  . Left leg pain 09/06/2016  . Leukocytosis 09/06/2016  . Generalized weakness 09/06/2016  . Acute renal injury ([place]) 09/06/2016  . A-fib ([place]) 08/30/2016  . Pneumonia 05/19/2016  . Hyperlipidemia 04/20/2016  . Leg pain, left 04/19/2016  . Back pain 04/19/2016  . Type 2 diabetes mellitus treated with insulin ([place]) 04/19/2016  . Essential hypertension 04/19/2016  . PAD (peripheral artery disease) ([place]) 04/19/2016  . Erectile dysfunction following radical prostatectomy 06/25/2015  . History of prostate cancer 12/23/2014  . Incontinence 12/23/2014    Past Surgical History:  Procedure Laterality Date  . APLIGRAFT PLACEMENT Left 10/15/2016   Procedure: APLIGRAFT PLACEMENT;  Surgeon: Albertine Patricia, DPM;  Location: [facility] ORS;  Service: Podiatry;  Laterality: Left;  necrotic ulcer  . CHOLECYSTECTOMY    . EYE SURGERY Bilateral    Cataract Extraction with IOL  . IRRIGATION AND DEBRIDEMENT FOOT Left 10/15/2016   Procedure: IRRIGATION AND DEBRIDEMENT FOOT-EXCISIONAL DEBRIDEMENT OF SKIN 3RD, 4TH AND 5TH TOES WITH APPLICATION OF APLIGRAFT;  Surgeon: Albertine Patricia, DPM;  Location: [facility] ORS;  Service: Podiatry;  Laterality: Left;  necrotic,gangrene  . IRRIGATION AND DEBRIDEMENT FOOT Left 12/09/2016   Procedure: IRRIGATION AND DEBRIDEMENT FOOT-MUSCLE/FASCIA, RESECTION OF FOURTH AND FIFTH METATARSAL NECROTIC BONE;  Surgeon: Albertine Patricia, DPM;  Location: [facility] ORS;  Service: Podiatry;  Laterality: Left;  . LOWER EXTREMITY ANGIOGRAPHY Left 08/16/2016   Procedure: Lower Extremity Angiography;  Surgeon:  Algernon Huxley, MD;  Location: [place] [facility];  Service: Cardiovascular;  Laterality: Left;  . LOWER EXTREMITY ANGIOGRAPHY Left 09/01/2016   Procedure: Lower Extremity Angiography;  Surgeon: Algernon Huxley, MD;  Location: [place] [facility];  Service: Cardiovascular;  Laterality: Left;  . LOWER EXTREMITY ANGIOGRAPHY Left 10/14/2016   Procedure: Lower Extremity Angiography;  Surgeon: Algernon Huxley, MD;  Location: [place] [facility];  Service: Cardiovascular;  Laterality: Left;  . LOWER EXTREMITY ANGIOGRAPHY Left 12/20/2016   Procedure: Lower Extremity Angiography;  Surgeon: Algernon Huxley, MD;  Location: [place] [facility];  Service: Cardiovascular;  Laterality: Left;  . LOWER EXTREMITY INTERVENTION  09/01/2016   Procedure: Lower Extremity Intervention;  Surgeon: Algernon Huxley, MD;  Location: [place] [facility];  Service: Cardiovascular;;  . PROSTATE SURGERY     removal  . TONSILLECTOMY     as a child    Prior to Admission medications   Medication Sig Start Date End Date Taking? Authorizing Provider  atorvastatin (LIPITOR) 80 MG tablet Take 80 mg by mouth daily at 6 PM.    Yes [provider]  diltiazem (CARDIZEM CD) 300 MG 24 hr capsule Take 300 mg by mouth daily.   Yes [provider]  furosemide (LASIX) 20 MG tablet TAKE 1 TABLET DAILY 10/24/15  Yes [provider]  JARDIANCE 10 MG TABS tablet Take 10 mg by mouth daily. 11/30/16  Yes [provider]  metFORMIN (GLUCOPHAGE) 1000 MG tablet TAKE 1 TABLET TWICE A DAY WITH MEALS 11/28/15  Yes [provider]  TOPROL XL 100 MG 24 hr tablet Take 100 mg by mouth daily. 12/25/16  Yes [provider]  TRULICITY 4.09 WJ/1.9JY SOPN 0.75 mg once a week. 11/14/16  Yes [provider]  warfarin (COUMADIN) 5 MG tablet Take 5 mg by mouth daily.   Yes [provider]  acetaminophen (TYLENOL) 500 MG tablet Take 500 mg by mouth every 6 (six) hours as needed for moderate pain.    [provider]  aspirin EC 81 MG tablet Take 1 tablet (81 mg total) by mouth daily. Patient not taking: Reported on 01/08/2017 08/16/16   Algernon Huxley, MD  HYDROcodone-acetaminophen (NORCO) 5-325 MG tablet Take 1 tablet by mouth every 6 (six) hours as needed for moderate pain. Patient not taking: Reported on 01/08/2017 12/20/16   Algernon Huxley, MD  insulin glargine (LANTUS) 100 UNIT/ML injection  Inject 0.24 mLs (24 Units total) into the skin at bedtime. Patient not taking: Reported on 01/08/2017 09/06/16   Theodoro Grist, MD    Allergies Contrast media [iodinated diagnostic agents]; Iohexol; and Latex  Family History  Problem Relation Age of Onset  . Hypertension Mother   . Prostate cancer Neg Hx   . Bladder Cancer Neg Hx   . Kidney disease Neg Hx     Social History Social History  Substance Use Topics  . Smoking status: Former Smoker    Packs/day: 0.25    Types: Cigarettes    Quit date: 10/14/1994  . Smokeless tobacco: Never Used  . Alcohol use No    Review of Systems Constitutional: No fever/chills Eyes: No visual changes. ENT: No sore throat. No stiff neck no neck pain Cardiovascular: Denies chest pain. Respiratory: Denies shortness of breath. Gastrointestinal:   no vomiting.  No diarrhea.  No constipation. Genitourinary: Negative for dysuria. Musculoskeletal: Negative lower extremity swelling Skin: Negative for rash. Neurological: Negative for severe headaches, focal weakness or numbness.  ____________________________________________   PHYSICAL EXAM:  VITAL SIGNS: ED Triage Vitals  Enc Vitals Group     BP 01/08/17 0957 (!) 144/84     Pulse Rate 01/08/17 0957 (!) 150     Resp 01/08/17 0957 (!) 27     Temp 01/08/17 0957 97.8 F (36.6 C)     Temp Source 01/08/17 0957 Oral     SpO2 01/08/17 0957 (!) 87 %     Weight 01/08/17 1000 190 lb (86.2 kg)     Height 01/08/17 1000 5\' 7"  (1.702 m)     Head Circumference --      Peak Flow --      Pain Score 01/08/17 0956 0     Pain Loc --      Pain Edu? --      Excl. in [place]? --     Constitutional: Alert and orientedTo name and place unsure of the exact date but knows the year. Well appearing and in no acute distress. Eyes: Conjunctivae are normal Head: Atraumatic HEENT: No congestion/rhinnorhea. Mucous membranes are moist.  Oropharynx non-erythematous Neck:   Nontender with no meningismus, no masses, no  stridor Cardiovascular: Rapid, irregularly irregular. Grossly normal heart sounds.  Poor baseline peripheral circulation. Respiratory: Normal respiratory effort.  No retractions. Bibasilar rails appreciated Abdominal: Soft and nontender. No distention. No guarding no rebound Back:  There is no focal tenderness or step off.  there is no midline tenderness there are no lesions noted. there is no CVA tenderness Musculoskeletal: , no upper extremity tenderness. No joint effusions, no DVT signs strong distal pulses no edema there is significant erythema necrosis to the right foot, with a smell suggestive of necrosis. There is no crepitus noted. Neurologic:  Normal speech and language. No gross focal neurologic deficits are appreciated.  Skin:  Skin is warm, dry and intact. No rash noted. Psychiatric: Mood and affect are normal. Speech and behavior are normal.  ____________________________________________   LABS (all labs ordered are listed, but only abnormal results are displayed)  Labs Reviewed  COMPREHENSIVE METABOLIC PANEL - Abnormal; Notable for the following:       Result Value   Glucose, Bld 149 (*)    Calcium 8.5 (*)    Albumin 2.8 (*)    Alkaline Phosphatase 162 (*)    All other components within normal limits  CBC WITH DIFFERENTIAL/PLATELET - Abnormal; Notable for the following:    WBC 14.1 (*)    RBC 4.28 (*)    Hemoglobin 12.0 (*)    HCT 35.6 (*)    RDW 16.9 (*)    Neutro Abs 11.9 (*)    All other components within normal limits  BRAIN NATRIURETIC PEPTIDE - Abnormal; Notable for the following:    B Natriuretic Peptide 992.0 (*)    All other components within normal limits  CULTURE, BLOOD (ROUTINE X 2)  CULTURE, BLOOD (ROUTINE X 2)  LACTIC ACID, PLASMA  URINALYSIS, COMPLETE (UACMP) WITH MICROSCOPIC  PROTIME-INR   ____________________________________________  EKG  I personally interpreted any EKGs ordered by me or triage Atrial fibrillation, with rapid ventricular  response. Rate is 139, no acute ST elevation nonspecific ST changes noted. ____________________________________________  MEQASTMHD  I reviewed any imaging ordered by me or triage that were performed during my shift and, if possible, patient and/or family made aware of any abnormal findings. ____________________________________________   PROCEDURES  Procedure(s) performed: None  Procedures  Critical Care performed: CRITICAL CARE Performed by: Schuyler Amor   Total critical care  time: 45 minutes  Critical care time was exclusive of separately billable procedures and treating other patients.  Critical care was necessary to treat or prevent imminent or life-threatening deterioration.  Critical care was time spent personally by me on the following activities: development of treatment plan with patient and/or surrogate as well as nursing, discussions with consultants, evaluation of patient's response to treatment, examination of patient, obtaining history from patient or surrogate, ordering and performing treatments and interventions, ordering and review of laboratory studies, ordering and review of radiographic studies, pulse oximetry and re-evaluation of patient's condition.   ____________________________________________   INITIAL IMPRESSION / ASSESSMENT AND PLAN / ED COURSE  Pertinent labs & imaging results that were available during my care of the patient were reviewed by me and considered in my medical decision making (see chart for details).  Patient very complicated patient has an ongoing necrotic foot which is being managed by vascular surgery initially there was some question as to whether that was the cause of his problems today however he is hypoxic, has rails, and history of CHF, and chest x-ray shows fluid as well as a BMP which is 3 times what it was last time checked. All of this is suggestive of decompensated CHF likely secondary to his rapid ventricular response from  is on managed atrial fibrillation. Given all this, we are giving him Lasix and verapamil as there is no more Cardizem in the hospital due to national shortage. Patient has no chest pain, and as long as he is not moving he is not markedly hypoxic. He is on oxygen which is not his baseline. We will admit him for ongoing diuresis and further evaluation of his necrotic foot for which she is taking home antibiotics.  ____________________________________________   FINAL CLINICAL IMPRESSION(S) / ED DIAGNOSES  Final diagnoses:  None      This chart was dictated using voice recognition software.  Despite best efforts to proofread,  errors can occur which can change meaning.      Schuyler Amor, MD 01/08/17 1230    Schuyler Amor, MD 01/08/17 1230

## 2017-01-08 NOTE — Progress Notes (Signed)
ANTIBIOTIC CONSULT NOTE - INITIAL  Pharmacy Consult for Vancomycin and Ceftazidime Indication: sepsis  Allergies  Allergen Reactions  . Contrast Media [Iodinated Diagnostic Agents] Shortness Of Breath    SOB  . Iohexol Shortness Of Breath     Desc: Respiratory Distress, Laryngedema, diaphoresis, Onset Date: [id number]   . Latex Rash    Patient Measurements: Height: 5\' 7"  (170.2 cm) Weight: 190 lb (86.2 kg) IBW/kg (Calculated) : 66.1 Adjusted Body Weight:   8/18 Patient admitted with sepsis and gangrene foot. Patient is being started on vancomycin and ceftazidime.   Ke=0.066  Half Life = 10.5 hours Vd = 60.34 Desired vanc trough of 15-20 mcg/mL Stacked dosing of 6 hours  8/18 Patient will start vancomycin 1000mg  Q12H and ceftazidime 1g Q8H. A vanc trough will be ordered prior to the fifth dose. Pharmacy will continue to follow and adjust as needed.   Vital Signs: Temp: 97.9 F (36.6 C) (08/18 1459) Temp Source: Oral (08/18 0957) BP: 145/99 (08/18 1459) Pulse Rate: 93 (08/18 1459) Intake/Output from previous day: No intake/output data recorded. Intake/Output from this shift: No intake/output data recorded.  Labs:  Recent Labs  01/08/17 1001  WBC 14.1*  HGB 12.0*  PLT 354  CREATININE 0.84   Estimated Creatinine Clearance: 74.7 mL/min (by C-G formula based on SCr of 0.84 mg/dL). No results for input(s): VANCOTROUGH, VANCOPEAK, VANCORANDOM, GENTTROUGH, GENTPEAK, GENTRANDOM, TOBRATROUGH, TOBRAPEAK, TOBRARND, AMIKACINPEAK, AMIKACINTROU, AMIKACIN in the last 72 hours.   Microbiology: Recent Results (from the past 720 hour(s))  Blood Culture (routine x 2)     Status: None   Collection Time: 12/29/16  8:10 PM  Result Value Ref Range Status   Specimen Description BLOOD BLOOD RIGHT HAND  Final   Special Requests   Final    BOTTLES DRAWN AEROBIC AND ANAEROBIC Blood Culture results may not be optimal due to an excessive volume of blood received in culture bottles   Culture NO GROWTH 5 DAYS  Final   Report Status 01/03/2017 FINAL  Final  Blood Culture (routine x 2)     Status: None   Collection Time: 12/29/16  8:10 PM  Result Value Ref Range Status   Specimen Description BLOOD LEFT ANTECUBITAL  Final   Special Requests   Final    BOTTLES DRAWN AEROBIC AND ANAEROBIC Blood Culture results may not be optimal due to an excessive volume of blood received in culture bottles   Culture NO GROWTH 5 DAYS  Final   Report Status 01/03/2017 FINAL  Final  Surgical PCR screen     Status: None   Collection Time: 12/29/16 11:20 PM  Result Value Ref Range Status   MRSA, PCR NEGATIVE NEGATIVE Final   Staphylococcus aureus NEGATIVE NEGATIVE Final    Comment:        The Xpert SA Assay (FDA approved for NASAL specimens in patients over 72 years of age), is one component of a comprehensive surveillance program.  Test performance has been validated by [facility] for patients greater than or equal to 29 year old. It is not intended to diagnose infection nor to guide or monitor treatment.     Medical History: Past Medical History:  Diagnosis Date  . Arthritis   . Atrial fibrillation ([place])   . Carcinoma of prostate ([place])   . CHF (congestive heart failure) ([place])   . Chronic kidney disease   . Diabetes mellitus without complication ([place])   . ED (erectile dysfunction)   . Frequent urination   . Hyperlipidemia   .  Hypertension   . Moderate mitral insufficiency   . Peripheral vascular disease ([place])   . Pneumonia 04/2016  . Prostate cancer ([place])   . Sleep apnea    OSA--USE C-PAP  . Ulcer of left foot due to type 2 diabetes mellitus ([place])   . Urinary stress incontinence, male    Microbiology Results BCx 8/18>>  Goal of Therapy:  Vancomycin trough level 15-20 mcg/ml  Plan:  Vancomycin 8/18>> Ceftazidime 8/18>>  Measure antibiotic drug levels at steady state Follow up culture results  Lendon Ka, PharmD Pharmacy Resident  01/08/2017,3:43 PM

## 2017-01-08 NOTE — ED Notes (Signed)
Patient noted to have multiple toes amputated, black/necrotic toes on the left.

## 2017-01-08 NOTE — ED Notes (Signed)
Nurse redressed pt's left foot wound with non-adherent, 4x4, and Kerlix. Pt tolerated well.

## 2017-01-08 NOTE — ED Notes (Signed)
Dr. Burlene Arnt  In to evaluate pt. Pt's left foot wound is malodorous and open. Necrotic tissue noted. Doctor asked nurse to leave wound unwrapped until next doctor evaluates. Nurse asked doctor for pain medication. Urine cath to be inserted for output recording.

## 2017-01-08 NOTE — ED Notes (Signed)
Medication request placed to pharmacy for missing dose.

## 2017-01-08 NOTE — ED Notes (Signed)
Admitting doctor is at pt's bedside at this time.

## 2017-01-08 NOTE — ED Notes (Signed)
Nurse introduced herself to pt. Pt stating that he isn't sure of what the plan is. Nurse explained that pt was going to receive some medications to help his heart rate and breathing. Pt appears to be SOB with talking. Pt has O2 via [place] in place and saturation is at 94-95%. Pt stating that he is supposed to have an amputation surgery later this week for his left foot. Pt stating hx of diabetes and CHF. Pt's skin is cool and clammy at this time.

## 2017-01-08 NOTE — Progress Notes (Signed)
Patient ID: [name], male   DOB: [birth year], 82 y.o.   MRN: [id number] ACP note, Patient and wife at the bedside Patient admitted with sepsis and gangrene of the left second toe and foot. Patient also has rapid atrial fibrillation and acute diastolic congestive heart failure. CODE STATUS discussed with patient and wife at length. Patient would like to remain a full code at this point. Time spent on ACP discussion 17 minutes  Dr. Loletha Grayer

## 2017-01-08 NOTE — ED Notes (Signed)
Nurse cleaned pt and placed him onto a fresh diaper. Pt has a rash noted to genitalia region. Wife stating that he has had it since he took some antibiotics. Pt helped with repositioning. Pt assisted with urinal too at this time.

## 2017-01-08 NOTE — ED Notes (Signed)
Pt given a snack. Pt is resting comfortably at this time.

## 2017-01-09 DIAGNOSIS — I70262 Atherosclerosis of native arteries of extremities with gangrene, left leg: Secondary | ICD-10-CM

## 2017-01-09 LAB — BASIC METABOLIC PANEL
ANION GAP: 5 (ref 5–15)
BUN: 15 mg/dL (ref 6–20)
CO2: 28 mmol/L (ref 22–32)
Calcium: 8.2 mg/dL — ABNORMAL LOW (ref 8.9–10.3)
Chloride: 103 mmol/L (ref 101–111)
Creatinine, Ser: 0.85 mg/dL (ref 0.61–1.24)
Glucose, Bld: 127 mg/dL — ABNORMAL HIGH (ref 65–99)
POTASSIUM: 3.2 mmol/L — AB (ref 3.5–5.1)
SODIUM: 136 mmol/L (ref 135–145)

## 2017-01-09 LAB — CBC
HEMATOCRIT: 34.8 % — AB (ref 40.0–52.0)
HEMOGLOBIN: 11.7 g/dL — AB (ref 13.0–18.0)
MCH: 28 pg (ref 26.0–34.0)
MCHC: 33.5 g/dL (ref 32.0–36.0)
MCV: 83.4 fL (ref 80.0–100.0)
Platelets: 304 10*3/uL (ref 150–440)
RBC: 4.17 MIL/uL — AB (ref 4.40–5.90)
RDW: 16.8 % — AB (ref 11.5–14.5)
WBC: 13.8 10*3/uL — AB (ref 3.8–10.6)

## 2017-01-09 LAB — MAGNESIUM: Magnesium: 1.7 mg/dL (ref 1.7–2.4)

## 2017-01-09 LAB — GLUCOSE, CAPILLARY
GLUCOSE-CAPILLARY: 109 mg/dL — AB (ref 65–99)
Glucose-Capillary: 174 mg/dL — ABNORMAL HIGH (ref 65–99)
Glucose-Capillary: 145 mg/dL — ABNORMAL HIGH (ref 65–99)
Glucose-Capillary: 264 mg/dL — ABNORMAL HIGH (ref 65–99)

## 2017-01-09 LAB — POTASSIUM: POTASSIUM: 3.9 mmol/L (ref 3.5–5.1)

## 2017-01-09 MED ORDER — POTASSIUM CHLORIDE CRYS ER 20 MEQ PO TBCR
40.0000 meq | EXTENDED_RELEASE_TABLET | Freq: Two times a day (BID) | ORAL | Status: DC
  Administered 2017-01-09 – 2017-01-11 (×6): 40 meq via ORAL
  Filled 2017-01-09 (×6): qty 2

## 2017-01-09 MED ORDER — MAGNESIUM OXIDE 400 (241.3 MG) MG PO TABS
400.0000 mg | ORAL_TABLET | Freq: Every day | ORAL | Status: AC
  Administered 2017-01-09 – 2017-01-10 (×2): 400 mg via ORAL
  Filled 2017-01-09 (×2): qty 1

## 2017-01-09 NOTE — Progress Notes (Signed)
[facility] Cardiology  SUBJECTIVE: Patient laying comfortably, denies chest pain or palpitations   Vitals:   01/08/17 1459 01/08/17 1929 01/09/17 0458 01/09/17 0800  BP: (!) 145/99 129/75 (!) 155/85 (!) 159/100  Pulse: 93 94 85 (!) 121  Resp: 16 18 18  (!) 22  Temp: 97.9 F (36.6 C) 97.9 F (36.6 C) 98.3 F (36.8 C) 98.4 F (36.9 C)  TempSrc:  Oral Oral Oral  SpO2: 94% 97% 90% 92%  Weight: 81.7 kg (180 lb 1.6 oz)     Height: 5\' 7"  (1.702 m)        Intake/Output Summary (Last 24 hours) at 01/09/17 2952 Last data filed at 01/09/17 0505  Gross per 24 hour  Intake              490 ml  Output             1600 ml  Net            -1110 ml      PHYSICAL EXAM  General: Well developed, well nourished, in no acute distress HEENT:  Normocephalic and atramatic Neck:  No JVD.  Lungs: Clear bilaterally to auscultation and percussion. Heart: Irregularly irregular rhythm . Normal S1 and S2 without gallops or murmurs.  Abdomen: Bowel sounds are positive, abdomen soft and non-tender  Msk:  Back normal, normal gait. Normal strength and tone for age. Extremities: No clubbing, cyanosis or edema.   Neuro: Alert and oriented X 3. Psych:  Good affect, responds appropriately   LABS: Basic Metabolic Panel:  Recent Labs  01/08/17 1001 01/09/17 0516  NA 139 136  K 4.1 3.2*  CL 104 103  CO2 25 28  GLUCOSE 149* 127*  BUN 19 15  CREATININE 0.84 0.85  CALCIUM 8.5* 8.2*  MG  --  1.7   Liver Function Tests:  Recent Labs  01/08/17 1001  AST 22  ALT 19  ALKPHOS 162*  BILITOT 1.0  PROT 6.8  ALBUMIN 2.8*   No results for input(s): LIPASE, AMYLASE in the last 72 hours. CBC:  Recent Labs  01/08/17 1001 01/09/17 0516  WBC 14.1* 13.8*  NEUTROABS 11.9*  --   HGB 12.0* 11.7*  HCT 35.6* 34.8*  MCV 83.2 83.4  PLT 354 304   Cardiac Enzymes: No results for input(s): CKTOTAL, CKMB, CKMBINDEX, TROPONINI in the last 72 hours. BNP: Invalid input(s): POCBNP D-Dimer: No results for  input(s): DDIMER in the last 72 hours. Hemoglobin A1C: No results for input(s): HGBA1C in the last 72 hours. Fasting Lipid Panel: No results for input(s): CHOL, HDL, LDLCALC, TRIG, CHOLHDL, LDLDIRECT in the last 72 hours. Thyroid Function Tests: No results for input(s): TSH, T4TOTAL, T3FREE, THYROIDAB in the last 72 hours.  Invalid input(s): FREET3 Anemia Panel: No results for input(s): VITAMINB12, FOLATE, FERRITIN, TIBC, IRON, RETICCTPCT in the last 72 hours.  Dg Chest 2 View  Result Date: 01/08/2017 CLINICAL DATA:  Hypoglycemic event.  Decreased O2 levels. EXAM: CHEST  2 VIEW COMPARISON:  December 29, 2016 FINDINGS: Mild cardiomegaly. Mild pulmonary venous congestion without overt edema. Small left effusion with underlying atelectasis. No other infiltrate identified. No other acute abnormalities. IMPRESSION: Pulmonary venous congestion. Small left effusion with underlying atelectasis. Electronically Signed   By: Dorise Bullion III M.D   On: 01/08/2017 10:27     Echo   TELEMETRY: Atrial fibrillation:  ASSESSMENT AND PLAN:  Active Problems:   Gangrene ([place])    1. Chronic atrial fibrillation, with rapid ventricular rate, rate appears improved,  likely compensatory for gangrene of the left foot, asymptomatic and hemodynamically stable 2. Gangrene left foot, awaiting amputation, scheduled for 01/10/2017 3. Acute diastolic CHF, stable  Recommendations  1. Continue current therapy 2. Continue diuresis 3. Carefully monitor renal status 4. May uptitrate metoprolol succinate for heart rates greater than 110 bpm, as needed  Sign off for now, please call if any questions   Isaias Cowman, MD, PhD, [facility] 01/09/2017 9:17 AM

## 2017-01-09 NOTE — Progress Notes (Signed)
[facility] NAME: [name]    MR#:  [id number]  DATE OF BIRTH:  Oct 09, 1934  SUBJECTIVE:  CHIEF COMPLAINT:   Chief Complaint  Patient presents with  . Altered Mental Status   The patient has 82 year old Caucasian male with medical history significant for cerebrovascular disease, status post recent left foot 3 toe amputation, with who was brought to the hospital with altered mental status shortness of breath and wheezing. Patient complained of significant left foot pain. He was noted to have second toe gangrene and was admitted to the hospital. While in the emergency room, patient was noted to be inA. Fib, RVR chest x-ray revealed congestive heart failure. Patient was diuresed of about 2.3 liters since admission. He feels comfortable, denies significant pain at present. Patient's heart rate remains around 120, patient was seen by cardiologist, now on Cardizem and Toprol-XL.    Review of Systems  Constitutional: Negative for chills, fever and weight loss.  HENT: Negative for congestion.   Eyes: Negative for blurred vision and double vision.  Respiratory: Positive for shortness of breath. Negative for cough, sputum production and wheezing.   Cardiovascular: Negative for chest pain, palpitations, orthopnea, leg swelling and PND.  Gastrointestinal: Negative for abdominal pain, blood in stool, constipation, diarrhea, nausea and vomiting.  Genitourinary: Negative for dysuria, frequency, hematuria and urgency.  Musculoskeletal: Positive for joint pain. Negative for falls.  Neurological: Negative for dizziness, tremors, focal weakness and headaches.  Endo/Heme/Allergies: Does not bruise/bleed easily.  Psychiatric/Behavioral: Negative for depression. The patient does not have insomnia.     VITAL SIGNS: Blood pressure (!) 159/100, pulse (!) 121, temperature 98.4 F (36.9 C), temperature source Oral, resp. rate (!) 22, height 5'  7" (1.702 m), weight 81.7 kg (180 lb 1.6 oz), SpO2 92 %.  PHYSICAL EXAMINATION:   GENERAL:  82 y.o.-year-old patient lying in the bed with no acute distress.  EYES: Pupils equal, round, reactive to light and accommodation. No scleral icterus. Extraocular muscles intact.  HEENT: Head atraumatic, normocephalic. Oropharynx and nasopharynx clear.  NECK:  Supple, no jugular venous distention. No thyroid enlargement, no tenderness.  LUNGS: Normal breath sounds bilaterally, no wheezing, rales,rhonchi or crepitation. No use of accessory muscles of respiration.  CARDIOVASCULAR: S1, S2  Irregularly irregular. No murmurs, rubs, or gallops.  ABDOMEN: Soft, nontender, nondistended. Bowel sounds present. No organomegaly or mass.  EXTREMITIES: No pedal edema, cyanosis, or clubbing. Left foot reveals second toe gangrene, surgically absent third to fifth toes , significant tenderness to palpation, diminished pedal pulses bilaterally NEUROLOGIC: Cranial nerves II through XII are intact. Muscle strength 5/5 in all extremities. Sensation intact. Gait not checked.  PSYCHIATRIC: The patient is alert and oriented x 3.  SKIN: No obvious rash, lesion, or ulcer.   ORDERS/RESULTS REVIEWED:   CBC  Recent Labs Lab 01/08/17 1001 01/09/17 0516  WBC 14.1* 13.8*  HGB 12.0* 11.7*  HCT 35.6* 34.8*  PLT 354 304  MCV 83.2 83.4  MCH 28.1 28.0  MCHC 33.7 33.5  RDW 16.9* 16.8*  LYMPHSABS 1.1  --   MONOABS 0.8  --   EOSABS 0.2  --   BASOSABS 0.1  --    ------------------------------------------------------------------------------------------------------------------  Chemistries   Recent Labs Lab 01/08/17 1001 01/09/17 0516  NA 139 136  K 4.1 3.2*  CL 104 103  CO2 25 28  GLUCOSE 149* 127*  BUN 19 15  CREATININE 0.84 0.85  CALCIUM 8.5* 8.2*  MG  --  1.7  AST 22  --   ALT 19  --   ALKPHOS 162*  --   BILITOT 1.0  --     ------------------------------------------------------------------------------------------------------------------ estimated creatinine clearance is 72.1 mL/min (by C-G formula based on SCr of 0.85 mg/dL). ------------------------------------------------------------------------------------------------------------------ No results for input(s): TSH, T4TOTAL, T3FREE, THYROIDAB in the last 72 hours.  Invalid input(s): FREET3  Cardiac Enzymes No results for input(s): CKMB, TROPONINI, MYOGLOBIN in the last 168 hours.  Invalid input(s): CK ------------------------------------------------------------------------------------------------------------------ Invalid input(s): POCBNP ---------------------------------------------------------------------------------------------------------------  RADIOLOGY: Dg Chest 2 View  Result Date: 01/08/2017 CLINICAL DATA:  Hypoglycemic event.  Decreased O2 levels. EXAM: CHEST  2 VIEW COMPARISON:  December 29, 2016 FINDINGS: Mild cardiomegaly. Mild pulmonary venous congestion without overt edema. Small left effusion with underlying atelectasis. No other infiltrate identified. No other acute abnormalities. IMPRESSION: Pulmonary venous congestion. Small left effusion with underlying atelectasis. Electronically Signed   By: Dorise Bullion III M.D   On: 01/08/2017 10:27    EKG:  Orders placed or performed during the hospital encounter of 10/12/16  . EKG 12-Lead  . EKG 12-Lead    ASSESSMENT AND PLAN:  Active Problems:   Gangrene ([place]) #1. Left foot second toe gangrene, patient was seen by Dr. Lucky Cowboy, who recommended below-knee amputation, likely tomorrow, as long as heart rate and hypoxia is controlled #2. Chronic A. Fib, RVR, continue patient on Cardizem, Toprol-XL, follow closely, Appreciate cardiology's input #3. Acute  diastolic CHF, patient is not negative of about 2.3 L already, his lungs sound relatively good, he is on 2 L of oxygen, continue Lasix at lower  dose, following urinary output #4hypokalemia, supplement orally, magnesium level was found to be a relatively low at 1.7, supplementing orally as well #5. Leukocytosis, follow with therapy #6. Lactic acidosis, due to sepsis,improved #7. Sepsis due to left foot second toe gangrene, continue patient on broad-spectrum antibiotic therapy, blood cultures are negative so far  Management plans discussed with the patient, family and they are in agreement.   DRUG ALLERGIES:  Allergies  Allergen Reactions  . Contrast Media [Iodinated Diagnostic Agents] Shortness Of Breath    SOB  . Iohexol Shortness Of Breath     Desc: Respiratory Distress, Laryngedema, diaphoresis, Onset Date: [id number]   . Latex Rash    CODE STATUS:     Code Status Orders        Start     Ordered   01/08/17 1342  Full code  Continuous     01/08/17 1342    Code Status History    Date Active Date Inactive Code Status Order ID Comments User Context   12/29/2016 10:32 PM 12/30/2016  4:34 PM Full Code [id number]  Lance Coon, MD ED   12/20/2016 12:51 PM 12/20/2016  6:42 PM Full Code [id number]  Algernon Huxley, MD Inpatient   10/22/2016 12:40 AM 10/24/2016  8:58 PM Full Code [id number]  Lance Coon, MD Inpatient   10/14/2016 12:01 PM 10/14/2016  7:25 PM Full Code [id number]  Algernon Huxley, MD Inpatient   08/30/2016  1:38 PM 09/06/2016  7:41 PM Full Code [id number]  Epifanio Lesches, MD Inpatient   05/19/2016  4:54 AM 05/22/2016  7:07 PM Full Code [id number]  Saundra Shelling, MD Inpatient      TOTAL TIME TAKING CARE OF THIS PATIENT:  40 minutes.    Theodoro Grist M.D on 01/09/2017 at 2:03 PM  Between 7am to 6pm - Pager - [phone]  After 6pm go to [email or web address] - password [email or web address] [facility]  Office  [phone]  CC: Primary care physician; Alanson Aly, [place]

## 2017-01-09 NOTE — Progress Notes (Signed)
[facility]  Daily Progress Note   Subjective  - * No surgery date entered *  No events overnight. No fever. Has had some tachycardia whichhas been an ongoing issue for him  Objective Vitals:   01/08/17 1459 01/08/17 1929 01/09/17 0458 01/09/17 0800  BP: (!) 145/99 129/75 (!) 155/85 (!) 159/100  Pulse: 93 94 85 (!) 121  Resp: 16 18 18  (!) 22  Temp: 97.9 F (36.6 C) 97.9 F (36.6 C) 98.3 F (36.8 C) 98.4 F (36.9 C)  TempSrc:  Oral Oral Oral  SpO2: 94% 97% 90% 92%  Weight: 81.7 kg (180 lb 1.6 oz)     Height: 5\' 7"  (1.702 m)       Intake/Output Summary (Last 24 hours) at 01/09/17 1005 Last data filed at 01/09/17 0505  Gross per 24 hour  Intake              490 ml  Output             1600 ml  Net            -1110 ml    PULM  CTAB CV  Irregular and tachycardic VASC  Left foot with gangrenous changes and stable drainage. Erythema is stable and has not progressed past the mid foot.  Laboratory CBC    Component Value Date/Time   WBC 13.8 (H) 01/09/2017 0516   HGB 11.7 (L) 01/09/2017 0516   HGB 16.3 01/20/2013 0255   HCT 34.8 (L) 01/09/2017 0516   HCT 47.9 01/20/2013 0255   PLT 304 01/09/2017 0516   PLT 245 01/20/2013 0255    BMET    Component Value Date/Time   NA 136 01/09/2017 0516   NA 140 01/20/2013 0255   K 3.2 (L) 01/09/2017 0516   K 3.5 01/20/2013 0255   CL 103 01/09/2017 0516   CL 103 01/20/2013 0255   CO2 28 01/09/2017 0516   CO2 30 01/20/2013 0255   GLUCOSE 127 (H) 01/09/2017 0516   GLUCOSE 145 (H) 01/20/2013 0255   BUN 15 01/09/2017 0516   BUN 22 (H) 01/20/2013 0255   CREATININE 0.85 01/09/2017 0516   CREATININE 1.09 01/20/2013 0255   CALCIUM 8.2 (L) 01/09/2017 0516   CALCIUM 9.4 01/20/2013 0255   GFRNONAA >60 01/09/2017 0516   GFRNONAA >60 01/20/2013 0255   GFRAA >60 01/09/2017 0516   GFRAA >60 01/20/2013 0255    Assessment/Planning:    Left foot gangrene. For left below-knee amputation tomorrow. Risks and  benefits again discussed. Nothing by mouth after midnight.  Tachycardia. Reasonably stable. Appreciate cardiology input and sounds like he would be stable for surgery. Use metoprolol as needed for tachycardia.  Continue antibiotics until after surgery. Can then likely have typical perioperative antibiotics.      [name]  01/09/2017, 10:05 AM

## 2017-01-10 ENCOUNTER — Encounter: Payer: Self-pay | Admitting: Certified Registered Nurse Anesthetist

## 2017-01-10 ENCOUNTER — Other Ambulatory Visit (INDEPENDENT_AMBULATORY_CARE_PROVIDER_SITE_OTHER): Payer: Self-pay

## 2017-01-10 LAB — BASIC METABOLIC PANEL
Anion gap: 10 (ref 5–15)
BUN: 16 mg/dL (ref 6–20)
CO2: 27 mmol/L (ref 22–32)
CREATININE: 0.9 mg/dL (ref 0.61–1.24)
Calcium: 8.6 mg/dL — ABNORMAL LOW (ref 8.9–10.3)
Chloride: 101 mmol/L (ref 101–111)
GFR calc Af Amer: >60 mL/min (ref >60)
Glucose, Bld: 130 mg/dL — ABNORMAL HIGH (ref 65–99)
Potassium: 4.2 mmol/L (ref 3.5–5.1)
SODIUM: 138 mmol/L (ref 135–145)

## 2017-01-10 LAB — GLUCOSE, CAPILLARY
GLUCOSE-CAPILLARY: 136 mg/dL — AB (ref 65–99)
GLUCOSE-CAPILLARY: 138 mg/dL — AB (ref 65–99)
GLUCOSE-CAPILLARY: 193 mg/dL — AB (ref 65–99)
Glucose-Capillary: 166 mg/dL — ABNORMAL HIGH (ref 65–99)

## 2017-01-10 LAB — VANCOMYCIN, TROUGH: VANCOMYCIN TR: 17 ug/mL (ref 15–20)

## 2017-01-10 MED ORDER — DEXAMETHASONE SODIUM PHOSPHATE 10 MG/ML IJ SOLN
INTRAMUSCULAR | Status: AC
  Filled 2017-01-10: qty 1

## 2017-01-10 MED ORDER — FENTANYL CITRATE (PF) 100 MCG/2ML IJ SOLN
INTRAMUSCULAR | Status: AC
  Filled 2017-01-10: qty 2

## 2017-01-10 MED ORDER — DILTIAZEM HCL 25 MG/5ML IV SOLN
10.0000 mg | INTRAVENOUS | Status: DC | PRN
  Administered 2017-01-10 – 2017-01-14 (×9): 10 mg via INTRAVENOUS
  Filled 2017-01-10 (×13): qty 5

## 2017-01-10 MED ORDER — ACETAMINOPHEN 10 MG/ML IV SOLN
INTRAVENOUS | Status: AC
  Filled 2017-01-10: qty 100

## 2017-01-10 MED ORDER — DEXTROSE 5 % IV SOLN
1.5000 g | INTRAVENOUS | Status: AC
  Administered 2017-01-12: 1.5 g via INTRAVENOUS
  Filled 2017-01-10: qty 1.5

## 2017-01-10 MED ORDER — METOPROLOL SUCCINATE ER 50 MG PO TB24
50.0000 mg | ORAL_TABLET | Freq: Every evening | ORAL | Status: DC
  Administered 2017-01-10 – 2017-01-18 (×7): 50 mg via ORAL
  Filled 2017-01-10 (×9): qty 1

## 2017-01-10 MED ORDER — ONDANSETRON HCL 4 MG/2ML IJ SOLN
INTRAMUSCULAR | Status: AC
  Filled 2017-01-10: qty 2

## 2017-01-10 MED ORDER — GLYCOPYRROLATE 0.2 MG/ML IJ SOLN
INTRAMUSCULAR | Status: AC
  Filled 2017-01-10: qty 1

## 2017-01-10 MED ORDER — MIDAZOLAM HCL 2 MG/2ML IJ SOLN
INTRAMUSCULAR | Status: AC
  Filled 2017-01-10: qty 2

## 2017-01-10 MED ORDER — SODIUM CHLORIDE 0.9 % IV SOLN
INTRAVENOUS | Status: DC
  Administered 2017-01-10: 10:00:00 via INTRAVENOUS

## 2017-01-10 MED ORDER — PROPOFOL 10 MG/ML IV BOLUS
INTRAVENOUS | Status: AC
  Filled 2017-01-10: qty 20

## 2017-01-10 MED ORDER — LIDOCAINE HCL (PF) 2 % IJ SOLN
INTRAMUSCULAR | Status: AC
  Filled 2017-01-10: qty 2

## 2017-01-10 NOTE — Progress Notes (Signed)
ANTIBIOTIC CONSULT NOTE - INITIAL  Pharmacy Consult for Vancomycin and Ceftazidime Indication: sepsis  Allergies  Allergen Reactions  . Contrast Media [Iodinated Diagnostic Agents] Shortness Of Breath    SOB  . Iohexol Shortness Of Breath     Desc: Respiratory Distress, Laryngedema, diaphoresis, Onset Date: [id number]   . Latex Rash    Patient Measurements: Height: 5\' 7"  (170.2 cm) Weight: 171 lb 4.8 oz (77.7 kg) IBW/kg (Calculated) : 66.1 Adjusted Body Weight:   8/18 Patient admitted with sepsis and gangrene foot. Patient is being started on vancomycin and ceftazidime.   Ke=0.066  Half Life = 10.5 hours Vd = 60.34 Desired vanc trough of 15-20 mcg/mL Stacked dosing of 6 hours  8/18 Patient will start vancomycin 1000mg  Q12H and ceftazidime 1g Q8H. A vanc trough will be ordered prior to the fifth dose. Pharmacy will continue to follow and adjust as needed.   8/20 1030 vancomycin trough therapeutic. Continue current regimen. Pharmacy will continue to follow and adjust as needed to maintain trough 15 to 20 mcg/ml.   Vital Signs: Temp: 98.4 F (36.9 C) (08/20 0827) Temp Source: Oral (08/20 0827) BP: 136/91 (08/20 0827) Pulse Rate: 108 (08/20 0827) Intake/Output from previous day: 08/19 0701 - 08/20 0700 In: 480 [P.O.:480] Out: 2000 [Urine:2000] Intake/Output from this shift: No intake/output data recorded.  Labs:  Recent Labs  01/08/17 1001 01/09/17 0516 01/10/17 0432  WBC 14.1* 13.8*  --   HGB 12.0* 11.7*  --   PLT 354 304  --   CREATININE 0.84 0.85 0.90   Estimated Creatinine Clearance: 62.2 mL/min (by C-G formula based on SCr of 0.9 mg/dL).  Recent Labs  01/10/17 1030  [place]     Microbiology: Recent Results (from the past 720 hour(s))  Blood Culture (routine x 2)     Status: None   Collection Time: 12/29/16  8:10 PM  Result Value Ref Range Status   Specimen Description BLOOD BLOOD RIGHT HAND  Final   Special Requests   Final    BOTTLES  DRAWN AEROBIC AND ANAEROBIC Blood Culture results may not be optimal due to an excessive volume of blood received in culture bottles   Culture NO GROWTH 5 DAYS  Final   Report Status 01/03/2017 FINAL  Final  Blood Culture (routine x 2)     Status: None   Collection Time: 12/29/16  8:10 PM  Result Value Ref Range Status   Specimen Description BLOOD LEFT ANTECUBITAL  Final   Special Requests   Final    BOTTLES DRAWN AEROBIC AND ANAEROBIC Blood Culture results may not be optimal due to an excessive volume of blood received in culture bottles   Culture NO GROWTH 5 DAYS  Final   Report Status 01/03/2017 FINAL  Final  Surgical PCR screen     Status: None   Collection Time: 12/29/16 11:20 PM  Result Value Ref Range Status   MRSA, PCR NEGATIVE NEGATIVE Final   Staphylococcus aureus NEGATIVE NEGATIVE Final    Comment:        The Xpert SA Assay (FDA approved for NASAL specimens in patients over 58 years of age), is one component of a comprehensive surveillance program.  Test performance has been validated by [facility] for patients greater than or equal to 33 year old. It is not intended to diagnose infection nor to guide or monitor treatment.   Culture, blood (Routine x 2)     Status: None (Preliminary result)   Collection Time: 01/08/17 10:00 AM  Result Value Ref Range Status   Specimen Description BLOOD BLOOD RIGHT ARM  Final   Special Requests   Final    BOTTLES DRAWN AEROBIC AND ANAEROBIC Blood Culture adequate volume   Culture NO GROWTH 2 DAYS  Final   Report Status PENDING  Incomplete  Culture, blood (Routine x 2)     Status: None (Preliminary result)   Collection Time: 01/08/17 11:20 AM  Result Value Ref Range Status   Specimen Description BLOOD BLOOD LEFT WRIST  Final   Special Requests   Final    BOTTLES DRAWN AEROBIC AND ANAEROBIC Blood Culture adequate volume   Culture NO GROWTH 2 DAYS  Final   Report Status PENDING  Incomplete    Medical History: Past Medical  History:  Diagnosis Date  . Arthritis   . Atrial fibrillation ([place])   . Carcinoma of prostate ([place])   . CHF (congestive heart failure) ([place])   . Chronic kidney disease   . Diabetes mellitus without complication ([place])   . ED (erectile dysfunction)   . Frequent urination   . Hyperlipidemia   . Hypertension   . Moderate mitral insufficiency   . Peripheral vascular disease ([place])   . Pneumonia 04/2016  . Prostate cancer ([place])   . Sleep apnea    OSA--USE C-PAP  . Ulcer of left foot due to type 2 diabetes mellitus ([place])   . Urinary stress incontinence, male    Microbiology Results BCx 8/18>>  Goal of Therapy:  Vancomycin trough level 15-20 mcg/ml  Plan:  Vancomycin 8/18>> Ceftazidime 8/18>>  Measure antibiotic drug levels at steady state Follow up culture results  Ovid Curd A. [place], [place].D., BCPS Clinical Pharmacist 01/10/2017,11:26 AM

## 2017-01-10 NOTE — Progress Notes (Signed)
Heart rate sustaining in 140s-150s. MD notified. IV medication given. Decreased to 120s. Heart rate returned to 150s. IV medication given. No change at this time. Will monitor.

## 2017-01-10 NOTE — Progress Notes (Signed)
Received call from Dr. Lucky Cowboy that patients surgery will have be rescheduled for Wednesday due to OR schedule. Informed Dr. Lucky Cowboy pt heart rate continues to be in the 120s-130s.

## 2017-01-10 NOTE — Consult Note (Signed)
[facility] Nurse wound consult note Reason for Consult: Moisture associated skin damage to bilateral gluteal folds.  Wound type:MASD from stool incontinence. His wife states he had diarrhea with recent medications he was taking prior to admission.  Pressure Injury POA: NA Measurement: 2.5 cm x 2 cm x 0.1 cm at the apex of the gluteal cleft and bilateral buttocks Wound SWN:IOEV and moist Drainage (amount, consistency, odor) Scant weeping Periwound: intact Dressing procedure/placement/frequency: Cleanse buttocks with soap and water and pat dry.  Apply Boudreaux butt paste twice daily.  Will not follow at this time.  Please re-consult if needed.  [name] RN BSN [place] Pager [phone]

## 2017-01-10 NOTE — Progress Notes (Addendum)
[facility]  Daily Progress Note   Subjective  - * Surgery Date in Future *  Remains tachycardic. No fever Pain persists and is about the same  Objective Vitals:   01/10/17 0409 01/10/17 0825 01/10/17 0827 01/10/17 1419  BP: 136/89 (!) 136/91 (!) 136/91 126/83  Pulse: 85 (!) 101 (!) 108 (!) 105  Resp: 18     Temp: 98.7 F (37.1 C) 98.4 F (36.9 C) 98.4 F (36.9 C)   TempSrc: Oral Oral Oral   SpO2: 93% 95% 95%   Weight: 77.7 kg (171 lb 4.8 oz)     Height:        Intake/Output Summary (Last 24 hours) at 01/10/17 1656 Last data filed at 01/10/17 0057  Gross per 24 hour  Intake              240 ml  Output              600 ml  Net             -360 ml    PULM  CTAB CV  RRR VASC  Left foot erythema in the mid foot stable, draining necrotic eschar.  Laboratory CBC    Component Value Date/Time   WBC 13.8 (H) 01/09/2017 0516   HGB 11.7 (L) 01/09/2017 0516   HGB 16.3 01/20/2013 0255   HCT 34.8 (L) 01/09/2017 0516   HCT 47.9 01/20/2013 0255   PLT 304 01/09/2017 0516   PLT 245 01/20/2013 0255    BMET    Component Value Date/Time   NA 138 01/10/2017 0432   NA 140 01/20/2013 0255   K 4.2 01/10/2017 0432   K 3.5 01/20/2013 0255   CL 101 01/10/2017 0432   CL 103 01/20/2013 0255   CO2 27 01/10/2017 0432   CO2 30 01/20/2013 0255   GLUCOSE 130 (H) 01/10/2017 0432   GLUCOSE 145 (H) 01/20/2013 0255   BUN 16 01/10/2017 0432   BUN 22 (H) 01/20/2013 0255   CREATININE 0.90 01/10/2017 0432   CREATININE 1.09 01/20/2013 0255   CALCIUM 8.6 (L) 01/10/2017 0432   CALCIUM 9.4 01/20/2013 0255   GFRNONAA >60 01/10/2017 0432   GFRNONAA >60 01/20/2013 0255   GFRAA >60 01/10/2017 0432   GFRAA >60 01/20/2013 0255    Assessment/Planning:    Left foot gangrene despite improved perfusion Patient remains tachycardic with suboptimal heart rate control, and was originally scheduled for left below-knee amputation today. The operating room does not have time  on the schedule for operation today, and the surgery has been bumped and cannot be done today. This has been rescheduled for Wednesday.  A fib with RVR.  If HR control can be improved, prior to Wednesday, would likely lower his perioperative cardiac risk  Leotis Pain  01/10/2017, 4:56 PM

## 2017-01-10 NOTE — Progress Notes (Signed)
Pre-op bath performed by this Probation officer. NS at [facility] to left hand. NAD noted. Family at bedside, Will continue monitor.

## 2017-01-10 NOTE — Progress Notes (Signed)
[place] at [place] NAME: [name]    MR#:  [id number]  DATE OF BIRTH:  09-16-1934  SUBJECTIVE:  CHIEF COMPLAINT:   Chief Complaint  Patient presents with  . Altered Mental Status   The patient has 82 year old Caucasian male with medical history significant for cerebrovascular disease, status post recent left foot 3 toe amputation, with who was brought to the hospital with altered mental status shortness of breath and wheezing. Patient complained of significant left foot pain. He was noted to have second toe gangrene and was admitted to the hospital. While in the emergency room, patient was noted to be inA. Fib, RVR chest x-ray revealed congestive heart failure. Patient was diuresed of about 2.3 liters since admission. He feels comfortable, denies significant pain at present. Patient's heart rate remains around 100-130, patient was seen by cardiologist, now on Cardizem and Toprol-XL. The patient feels good, denies significant pain in the limb. Potassium level has improved.   Review of Systems  Constitutional: Negative for chills, fever and weight loss.  HENT: Negative for congestion.   Eyes: Negative for blurred vision and double vision.  Respiratory: Positive for shortness of breath. Negative for cough, sputum production and wheezing.   Cardiovascular: Negative for chest pain, palpitations, orthopnea, leg swelling and PND.  Gastrointestinal: Negative for abdominal pain, blood in stool, constipation, diarrhea, nausea and vomiting.  Genitourinary: Negative for dysuria, frequency, hematuria and urgency.  Musculoskeletal: Positive for joint pain. Negative for falls.  Neurological: Negative for dizziness, tremors, focal weakness and headaches.  Endo/Heme/Allergies: Does not bruise/bleed easily.  Psychiatric/Behavioral: Negative for depression. The patient does not have insomnia.     VITAL SIGNS: Blood pressure (!) 136/91, pulse  (!) 108, temperature 98.4 F (36.9 C), temperature source Oral, resp. rate 18, height 5\' 7"  (1.702 m), weight 77.7 kg (171 lb 4.8 oz), SpO2 95 %.  PHYSICAL EXAMINATION:   GENERAL:  82 y.o.-year-old patient lying in the bed with no acute distress.  EYES: Pupils equal, round, reactive to light and accommodation. No scleral icterus. Extraocular muscles intact.  HEENT: Head atraumatic, normocephalic. Oropharynx and nasopharynx clear.  NECK:  Supple, no jugular venous distention. No thyroid enlargement, no tenderness.  LUNGS: Normal breath sounds bilaterally, no wheezing, rales,rhonchi or crepitation. No use of accessory muscles of respiration.  CARDIOVASCULAR: S1, S2  Irregularly irregular. No murmurs, rubs, or gallops.  ABDOMEN: Soft, nontender, nondistended. Bowel sounds present. No organomegaly or mass.  EXTREMITIES: No pedal edema, cyanosis, or clubbing. Left foot reveals second toe gangrene, surgically absent third to fifth toes , significant tenderness to palpation, diminished pedal pulses bilaterally NEUROLOGIC: Cranial nerves II through XII are intact. Muscle strength 5/5 in all extremities. Sensation intact. Gait not checked.  PSYCHIATRIC: The patient is alert and oriented x 3.  SKIN: No obvious rash, lesion, or ulcer.   ORDERS/RESULTS REVIEWED:   CBC  Recent Labs Lab 01/08/17 1001 01/09/17 0516  WBC 14.1* 13.8*  HGB 12.0* 11.7*  HCT 35.6* 34.8*  PLT 354 304  MCV 83.2 83.4  MCH 28.1 28.0  MCHC 33.7 33.5  RDW 16.9* 16.8*  LYMPHSABS 1.1  --   MONOABS 0.8  --   EOSABS 0.2  --   BASOSABS 0.1  --    ------------------------------------------------------------------------------------------------------------------  Chemistries   Recent Labs Lab 01/08/17 1001 01/09/17 0516 01/09/17 1604 01/10/17 0432  NA 139 136  --  138  K 4.1 3.2* 3.9 4.2  CL 104 103  --  101  CO2 25 28  --  27  GLUCOSE 149* 127*  --  130*  BUN 19 15  --  16  CREATININE 0.84 0.85  --  0.90    CALCIUM 8.5* 8.2*  --  8.6*  MG  --  1.7  --   --   AST 22  --   --   --   ALT 19  --   --   --   ALKPHOS 162*  --   --   --   BILITOT 1.0  --   --   --    ------------------------------------------------------------------------------------------------------------------ estimated creatinine clearance is 62.2 mL/min (by C-G formula based on SCr of 0.9 mg/dL). ------------------------------------------------------------------------------------------------------------------ No results for input(s): TSH, T4TOTAL, T3FREE, THYROIDAB in the last 72 hours.  Invalid input(s): FREET3  Cardiac Enzymes No results for input(s): CKMB, TROPONINI, MYOGLOBIN in the last 168 hours.  Invalid input(s): CK ------------------------------------------------------------------------------------------------------------------ Invalid input(s): POCBNP ---------------------------------------------------------------------------------------------------------------  RADIOLOGY: No results found.  EKG:  Orders placed or performed during the hospital encounter of 10/12/16  . EKG 12-Lead  . EKG 12-Lead    ASSESSMENT AND PLAN:  Active Problems:   Gangrene ([place]) #1. Left foot second toe gangrene, patient was seen by Dr. Lucky Cowboy, who recommended below-knee amputation, likely Today, although heart rate remained still poorly controlled, ranging from 100s to 130 intermittently despite oral medications, however, patient is not hypoxemic anymore with O2 sats of 95% on room air #2. Chronic A. Fib, RVR, continue patient on Cardizem, Toprol-XL, follow closely, Appreciate cardiology's input, may need to place patient on Cardizem intravenous drip for operation, provided blood pressure tolerates it #3. Acute  diastolic CHF, patient is net negative of about 2.6 L , his lungs sound relatively good, he is off oxygen now, continue oxygen as needed, continue Lasix #4hypokalemia, supplemented orally, magnesium level was found to be a  relatively low at 1.7, supplemented orally as well, levels are normal today #5. Leukocytosis, follow with therapy in a.m. #6. Lactic acidosis, due to sepsis, resolved #7 Sepsis due to left foot second toe gangrene, continue patient on broad-spectrum antibiotic therapy, blood cultures are negative so far  Management plans discussed with the patient, family and they are in agreement.   DRUG ALLERGIES:  Allergies  Allergen Reactions  . Contrast Media [Iodinated Diagnostic Agents] Shortness Of Breath    SOB  . Iohexol Shortness Of Breath     Desc: Respiratory Distress, Laryngedema, diaphoresis, Onset Date: [id number]   . Latex Rash    CODE STATUS:     Code Status Orders        Start     Ordered   01/08/17 1342  Full code  Continuous     01/08/17 1342    Code Status History    Date Active Date Inactive Code Status Order ID Comments User Context   12/29/2016 10:32 PM 12/30/2016  4:34 PM Full Code [id number]  Lance Coon, MD ED   12/20/2016 12:51 PM 12/20/2016  6:42 PM Full Code [id number]  Algernon Huxley, MD Inpatient   10/22/2016 12:40 AM 10/24/2016  8:58 PM Full Code [id number]  Lance Coon, MD Inpatient   10/14/2016 12:01 PM 10/14/2016  7:25 PM Full Code [id number]  Algernon Huxley, MD Inpatient   08/30/2016  1:38 PM 09/06/2016  7:41 PM Full Code [id number]  Epifanio Lesches, MD Inpatient   05/19/2016  4:54 AM 05/22/2016  7:07 PM Full Code [id number]  Saundra Shelling, MD Inpatient      TOTAL  TIME TAKING CARE OF THIS PATIENT:  35 minutes.  . Discussed with patient's wife, answered all questions, she voiced understanding   Aanshi Batchelder M.D on 01/10/2017 at 2:16 PM  Between 7am to 6pm - Pager - [phone]  After 6pm go to [email or web address] - password [email or web address] [facility]  Office  [phone]  CC: Primary care physician; Alanson Aly, [place]

## 2017-01-11 LAB — GLUCOSE, CAPILLARY
GLUCOSE-CAPILLARY: 133 mg/dL — AB (ref 65–99)
Glucose-Capillary: 152 mg/dL — ABNORMAL HIGH (ref 65–99)
Glucose-Capillary: 157 mg/dL — ABNORMAL HIGH (ref 65–99)
Glucose-Capillary: 187 mg/dL — ABNORMAL HIGH (ref 65–99)

## 2017-01-11 MED ORDER — PANTOPRAZOLE SODIUM 40 MG PO TBEC
40.0000 mg | DELAYED_RELEASE_TABLET | Freq: Every day | ORAL | Status: DC
  Administered 2017-01-11 – 2017-01-19 (×7): 40 mg via ORAL
  Filled 2017-01-11 (×6): qty 1

## 2017-01-11 MED ORDER — DEXTROSE 5 % IV SOLN
1.5000 g | INTRAVENOUS | Status: DC
  Filled 2017-01-11: qty 1.5

## 2017-01-11 NOTE — Progress Notes (Signed)
[place] at [place] NAME: [name]    MR#:  [id number]  DATE OF BIRTH:  1934-08-05  SUBJECTIVE:  CHIEF COMPLAINT:   Chief Complaint  Patient presents with  . Altered Mental Status   The patient has 82 year old Caucasian male with medical history significant for cerebrovascular disease, status post recent left foot 3 toe amputation, with who was brought to the hospital with altered mental status shortness of breath and wheezing. Patient complained of significant left foot pain. He was noted to have second toe gangrene and was admitted to the hospital. While in the emergency room, patient was noted to be inA. Fib, RVR chest x-ray revealed congestive heart failure. Patient was diuresed of about 3.9 liters since admission. He feels somewhat uncomfortable due to nausea, but denies any pain in lower extremity. Admits of some constipation. Heart rate has improved on Toprol and Cardizem CD, fluctuating between 55-104, remains in atrial fibrillation, blood pressure stable. Patient is on 2 L of oxygen nasal cannula, O2 sats are 96-97%.   Review of Systems  Constitutional: Negative for chills, fever and weight loss.  HENT: Negative for congestion.   Eyes: Negative for blurred vision and double vision.  Respiratory: Positive for shortness of breath. Negative for cough, sputum production and wheezing.   Cardiovascular: Negative for chest pain, palpitations, orthopnea, leg swelling and PND.  Gastrointestinal: Negative for abdominal pain, blood in stool, constipation, diarrhea, nausea and vomiting.  Genitourinary: Negative for dysuria, frequency, hematuria and urgency.  Musculoskeletal: Positive for joint pain. Negative for falls.  Neurological: Negative for dizziness, tremors, focal weakness and headaches.  Endo/Heme/Allergies: Does not bruise/bleed easily.  Psychiatric/Behavioral: Negative for depression. The patient does not have insomnia.      VITAL SIGNS: Blood pressure 133/89, pulse (!) 104, temperature 98.1 F (36.7 C), temperature source Oral, resp. rate 18, height 5\' 7"  (1.702 m), weight 77.7 kg (171 lb 4.8 oz), SpO2 97 %.  PHYSICAL EXAMINATION:   GENERAL:  82 y.o.-year-old patient lying in the bed with no acute distress. Somnolent and very sluggish today EYES: Pupils equal, round, reactive to light and accommodation. No scleral icterus. Extraocular muscles intact.  HEENT: Head atraumatic, normocephalic. Oropharynx and nasopharynx clear.  NECK:  Supple, no jugular venous distention. No thyroid enlargement, no tenderness.  LUNGS: Diminished breath sounds bilaterally, no wheezing, rales,rhonchi or crepitation. No use of accessory muscles of respiration. Poor inspiratory effort  CARDIOVASCULAR: S1, S2  Irregularly irregular. No murmurs, rubs, or gallops.  ABDOMEN: Soft, nontender, nondistended. Bowel sounds present. No organomegaly or mass.  EXTREMITIES: No pedal edema, cyanosis, or clubbing. Left foot reveals second toe gangrene, surgically absent third to fifth toes , significant tenderness to palpation, diminished pedal pulses bilaterally NEUROLOGIC: Cranial nerves II through XII are intact. Muscle strength 5/5 in all extremities. Sensation intact. Gait not checked.  PSYCHIATRIC: The patient is alert and oriented x 3.  SKIN: No obvious rash, lesion, or ulcer.   ORDERS/RESULTS REVIEWED:   CBC  Recent Labs Lab 01/08/17 1001 01/09/17 0516  WBC 14.1* 13.8*  HGB 12.0* 11.7*  HCT 35.6* 34.8*  PLT 354 304  MCV 83.2 83.4  MCH 28.1 28.0  MCHC 33.7 33.5  RDW 16.9* 16.8*  LYMPHSABS 1.1  --   MONOABS 0.8  --   EOSABS 0.2  --   BASOSABS 0.1  --    ------------------------------------------------------------------------------------------------------------------  Chemistries   Recent Labs Lab 01/08/17 1001 01/09/17 0516 01/09/17 1604 01/10/17 0432  NA  139 136  --  138  K 4.1 3.2* 3.9 4.2  CL 104 103  --  101    CO2 25 28  --  27  GLUCOSE 149* 127*  --  130*  BUN 19 15  --  16  CREATININE 0.84 0.85  --  0.90  CALCIUM 8.5* 8.2*  --  8.6*  MG  --  1.7  --   --   AST 22  --   --   --   ALT 19  --   --   --   ALKPHOS 162*  --   --   --   BILITOT 1.0  --   --   --    ------------------------------------------------------------------------------------------------------------------ estimated creatinine clearance is 62.2 mL/min (by C-G formula based on SCr of 0.9 mg/dL). ------------------------------------------------------------------------------------------------------------------ No results for input(s): TSH, T4TOTAL, T3FREE, THYROIDAB in the last 72 hours.  Invalid input(s): FREET3  Cardiac Enzymes No results for input(s): CKMB, TROPONINI, MYOGLOBIN in the last 168 hours.  Invalid input(s): CK ------------------------------------------------------------------------------------------------------------------ Invalid input(s): POCBNP ---------------------------------------------------------------------------------------------------------------  RADIOLOGY: No results found.  EKG:  Orders placed or performed during the hospital encounter of 10/12/16  . EKG 12-Lead  . EKG 12-Lead    ASSESSMENT AND PLAN:  Active Problems:   Gangrene ([place]) #1. Left foot second toe gangrene, patient was seen by Dr. Lucky Cowboy, who recommended below-knee amputation tomorrow, heart rate  control has improved with addition of 50 mg of Toprol-XL in the evening, fluctuating between 50s to 110s . Continue broad-spectrum antibiotic therapy, supportive therapy, pain medications #2. Chronic A. Fib, RVR, continue patient on Cardizem, higher-dose of Toprol-XL , heart rate has improved, following closely #3. Acute  diastolic CHF, patient is net negative of about 3.9 L , but he does not feel well today, now back on oxygen therapy, recheck chest x-ray and advanced Lasix intravenously if needed  #4hypokalemia, supplemented orally,  magnesium level was found to be a relatively low at 1.7, supplemented orally as well, levels are normal  #5. Leukocytosis, follow with therapy in a.m. #6. Lactic acidosis, due to sepsis, resolved #7 Sepsis due to left foot second toe gangrene, continue patient on broad-spectrum antibiotic therapy, blood cultures are negative so far #8 nausea, supportive therapy, Protonix, enema, follow closely. Management plans discussed with the patient, family and they are in agreement.   DRUG ALLERGIES:  Allergies  Allergen Reactions  . Contrast Media [Iodinated Diagnostic Agents] Shortness Of Breath    SOB  . Iohexol Shortness Of Breath     Desc: Respiratory Distress, Laryngedema, diaphoresis, Onset Date: [id number]   . Latex Rash    CODE STATUS:     Code Status Orders        Start     Ordered   01/08/17 1342  Full code  Continuous     01/08/17 1342    Code Status History    Date Active Date Inactive Code Status Order ID Comments User Context   12/29/2016 10:32 PM 12/30/2016  4:34 PM Full Code [id number]  Lance Coon, MD ED   12/20/2016 12:51 PM 12/20/2016  6:42 PM Full Code [id number]  Algernon Huxley, MD Inpatient   10/22/2016 12:40 AM 10/24/2016  8:58 PM Full Code [id number]  Lance Coon, MD Inpatient   10/14/2016 12:01 PM 10/14/2016  7:25 PM Full Code [id number]  Algernon Huxley, MD Inpatient   08/30/2016  1:38 PM 09/06/2016  7:41 PM Full Code [id number]  Epifanio Lesches, MD Inpatient   05/19/2016  4:54  AM 05/22/2016  7:07 PM Full Code [id number]  Saundra Shelling, MD Inpatient      TOTAL TIME TAKING CARE OF THIS PATIENT:  35 minutes.  . Discussed with patient's wife again today, answered all questions, she voiced understanding   Napoleon Monacelli M.D on 01/11/2017 at 3:14 PM  Between 7am to 6pm - Pager - [phone]  After 6pm go to [email or web address] - password [email or web address] [facility]  Office  [phone]  CC: Primary care physician; Alanson Aly, [place]

## 2017-01-12 ENCOUNTER — Inpatient Hospital Stay: Payer: Medicare Other | Admitting: Certified Registered Nurse Anesthetist

## 2017-01-12 ENCOUNTER — Encounter: Payer: Self-pay | Admitting: Vascular Surgery

## 2017-01-12 ENCOUNTER — Inpatient Hospital Stay: Payer: Medicare Other

## 2017-01-12 ENCOUNTER — Encounter
Admission: EM | Disposition: A | Discharge status: Skilled Nursing Facility | Payer: Self-pay | Source: Home / Self Care | Attending: Internal Medicine

## 2017-01-12 DIAGNOSIS — I70262 Atherosclerosis of native arteries of extremities with gangrene, left leg: Secondary | ICD-10-CM

## 2017-01-12 HISTORY — PX: AMPUTATION: SHX166

## 2017-01-12 LAB — CBC
HCT: 40.1 % (ref 40.0–52.0)
HEMOGLOBIN: 13.2 g/dL (ref 13.0–18.0)
MCH: 27.7 pg (ref 26.0–34.0)
MCHC: 32.9 g/dL (ref 32.0–36.0)
MCV: 84.2 fL (ref 80.0–100.0)
Platelets: 333 10*3/uL (ref 150–440)
RBC: 4.77 MIL/uL (ref 4.40–5.90)
RDW: 16.8 % — AB (ref 11.5–14.5)
WBC: 13.5 10*3/uL — AB (ref 3.8–10.6)

## 2017-01-12 LAB — BASIC METABOLIC PANEL
ANION GAP: 6 (ref 5–15)
BUN: 21 mg/dL — AB (ref 6–20)
CALCIUM: 8.7 mg/dL — AB (ref 8.9–10.3)
CO2: 28 mmol/L (ref 22–32)
Chloride: 101 mmol/L (ref 101–111)
Creatinine, Ser: 0.98 mg/dL (ref 0.61–1.24)
GFR calc Af Amer: >60 mL/min (ref >60)
GLUCOSE: 133 mg/dL — AB (ref 65–99)
Potassium: 5 mmol/L (ref 3.5–5.1)
SODIUM: 135 mmol/L (ref 135–145)

## 2017-01-12 LAB — PROTIME-INR
INR: 1.36
PROTHROMBIN TIME: 16.9 s — AB (ref 11.4–15.2)

## 2017-01-12 LAB — GLUCOSE, CAPILLARY
GLUCOSE-CAPILLARY: 146 mg/dL — AB (ref 65–99)
GLUCOSE-CAPILLARY: 155 mg/dL — AB (ref 65–99)
GLUCOSE-CAPILLARY: 224 mg/dL — AB (ref 65–99)
GLUCOSE-CAPILLARY: 118 mg/dL — AB (ref 65–99)

## 2017-01-12 SURGERY — AMPUTATION BELOW KNEE
Anesthesia: General | Laterality: Left | Wound class: Dirty or Infected

## 2017-01-12 MED ORDER — MORPHINE SULFATE (PF) 4 MG/ML IV SOLN
4.0000 mg | INTRAVENOUS | Status: DC | PRN
  Administered 2017-01-12 – 2017-01-14 (×4): 4 mg via INTRAVENOUS
  Filled 2017-01-12 (×4): qty 1

## 2017-01-12 MED ORDER — LIDOCAINE HCL (CARDIAC) 20 MG/ML IV SOLN
INTRAVENOUS | Status: DC | PRN
  Administered 2017-01-12: 80 mg via INTRAVENOUS

## 2017-01-12 MED ORDER — SEVOFLURANE IN SOLN
RESPIRATORY_TRACT | Status: AC
  Filled 2017-01-12: qty 250

## 2017-01-12 MED ORDER — SUGAMMADEX SODIUM 200 MG/2ML IV SOLN
INTRAVENOUS | Status: DC | PRN
  Administered 2017-01-12: 150 mg via INTRAVENOUS

## 2017-01-12 MED ORDER — LIDOCAINE HCL (PF) 2 % IJ SOLN
INTRAMUSCULAR | Status: AC
  Filled 2017-01-12: qty 2

## 2017-01-12 MED ORDER — PROPOFOL 10 MG/ML IV BOLUS
INTRAVENOUS | Status: DC | PRN
  Administered 2017-01-12: 80 mg via INTRAVENOUS

## 2017-01-12 MED ORDER — DIGOXIN 0.25 MG/ML IJ SOLN
0.2500 mg | Freq: Once | INTRAMUSCULAR | Status: AC
  Administered 2017-01-12: 0.25 mg via INTRAVENOUS
  Filled 2017-01-12: qty 1

## 2017-01-12 MED ORDER — OXYCODONE HCL 5 MG PO TABS: 5.0000 mg | ORAL_TABLET | Freq: Once | ORAL | Status: DC | PRN

## 2017-01-12 MED ORDER — FENTANYL CITRATE (PF) 100 MCG/2ML IJ SOLN
25.0000 ug | INTRAMUSCULAR | Status: DC | PRN
  Administered 2017-01-12: 50 ug via INTRAVENOUS

## 2017-01-12 MED ORDER — MEPERIDINE HCL 50 MG/ML IJ SOLN: 6.2500 mg | INTRAMUSCULAR | Status: DC | PRN

## 2017-01-12 MED ORDER — SUCCINYLCHOLINE CHLORIDE 20 MG/ML IJ SOLN
INTRAMUSCULAR | Status: AC
  Filled 2017-01-12: qty 1

## 2017-01-12 MED ORDER — ROCURONIUM BROMIDE 100 MG/10ML IV SOLN
INTRAVENOUS | Status: DC | PRN
  Administered 2017-01-12: 10 mg via INTRAVENOUS
  Administered 2017-01-12: 40 mg via INTRAVENOUS

## 2017-01-12 MED ORDER — METOPROLOL TARTRATE 5 MG/5ML IV SOLN
INTRAVENOUS | Status: DC | PRN
  Administered 2017-01-12: 1 mg via INTRAVENOUS
  Administered 2017-01-12 (×2): 2 mg via INTRAVENOUS

## 2017-01-12 MED ORDER — FENTANYL CITRATE (PF) 100 MCG/2ML IJ SOLN
INTRAMUSCULAR | Status: AC
  Filled 2017-01-12: qty 2

## 2017-01-12 MED ORDER — FENTANYL CITRATE (PF) 100 MCG/2ML IJ SOLN
INTRAMUSCULAR | Status: DC | PRN
  Administered 2017-01-12 (×2): 50 ug via INTRAVENOUS

## 2017-01-12 MED ORDER — OXYCODONE HCL 5 MG/5ML PO SOLN: 5.0000 mg | Freq: Once | ORAL | Status: DC | PRN

## 2017-01-12 MED ORDER — OXYCODONE HCL 5 MG PO TABS
5.0000 mg | ORAL_TABLET | Freq: Four times a day (QID) | ORAL | Status: DC | PRN
  Administered 2017-01-12 – 2017-01-14 (×4): 5 mg via ORAL
  Filled 2017-01-12 (×6): qty 1

## 2017-01-12 MED ORDER — AMIODARONE HCL 200 MG PO TABS: 400.0000 mg | ORAL_TABLET | Freq: Two times a day (BID) | ORAL | Status: DC

## 2017-01-12 MED ORDER — ESMOLOL HCL 100 MG/10ML IV SOLN
INTRAVENOUS | Status: DC | PRN
  Administered 2017-01-12 (×2): 20 mg via INTRAVENOUS
  Administered 2017-01-12: 10 mg via INTRAVENOUS
  Administered 2017-01-12: 20 mg via INTRAVENOUS

## 2017-01-12 MED ORDER — PROPOFOL 10 MG/ML IV BOLUS
INTRAVENOUS | Status: AC
  Filled 2017-01-12: qty 20

## 2017-01-12 MED ORDER — EPHEDRINE SULFATE 50 MG/ML IJ SOLN
INTRAMUSCULAR | Status: AC
  Filled 2017-01-12: qty 1

## 2017-01-12 MED ORDER — PHENYLEPHRINE HCL 10 MG/ML IJ SOLN
INTRAMUSCULAR | Status: DC | PRN
  Administered 2017-01-12 (×5): 200 ug via INTRAVENOUS

## 2017-01-12 MED ORDER — FENTANYL CITRATE (PF) 100 MCG/2ML IJ SOLN
INTRAMUSCULAR | Status: AC
  Administered 2017-01-12: 50 ug via INTRAVENOUS
  Filled 2017-01-12: qty 2

## 2017-01-12 MED ORDER — LACTATED RINGERS IV SOLN
INTRAVENOUS | Status: DC | PRN
  Administered 2017-01-12: 09:00:00 via INTRAVENOUS

## 2017-01-12 MED ORDER — PROMETHAZINE HCL 25 MG/ML IJ SOLN: 6.2500 mg | INTRAMUSCULAR | Status: DC | PRN

## 2017-01-12 MED ORDER — PHENYLEPHRINE HCL 10 MG/ML IJ SOLN
INTRAMUSCULAR | Status: AC
  Filled 2017-01-12: qty 1

## 2017-01-12 SURGICAL SUPPLY — 39 items
BANDAGE ELASTIC 6 LF NS (GAUZE/BANDAGES/DRESSINGS) ×2 IMPLANT
BLADE SAGITTAL WIDE XTHICK NO (BLADE) ×2 IMPLANT
BNDG COHESIVE 4X5 TAN STRL (GAUZE/BANDAGES/DRESSINGS) ×2 IMPLANT
BNDG GAUZE 4.5X4.1 6PLY STRL (MISCELLANEOUS) ×4 IMPLANT
BRUSH SCRUB EZ  4% CHG (MISCELLANEOUS) ×1
BRUSH SCRUB EZ 4% CHG (MISCELLANEOUS) ×1 IMPLANT
CANISTER SUCT 1200ML W/VALVE (MISCELLANEOUS) ×2 IMPLANT
DRAIN PENROSE 1/4X12 LTX (DRAIN) IMPLANT
DURAPREP 26ML APPLICATOR (WOUND CARE) ×2 IMPLANT
ELECT CAUTERY BLADE 6.4 (BLADE) ×2 IMPLANT
ELECT REM PT RETURN 9FT ADLT (ELECTROSURGICAL) ×2
ELECTRODE REM PT RTRN 9FT ADLT (ELECTROSURGICAL) ×1 IMPLANT
GAUZE PETRO XEROFOAM 1X8 (MISCELLANEOUS) ×4 IMPLANT
GLOVE BIO SURGEON STRL SZ7 (GLOVE) IMPLANT
GLOVE INDICATOR 7.5 STRL GRN (GLOVE) IMPLANT
GLOVE SKINSENSE NS SZ7.0 (GLOVE) ×5
GLOVE SKINSENSE STRL SZ7.0 (GLOVE) ×5 IMPLANT
GOWN STRL REUS W/ TWL LRG LVL3 (GOWN DISPOSABLE) ×2 IMPLANT
GOWN STRL REUS W/ TWL XL LVL3 (GOWN DISPOSABLE) ×1 IMPLANT
GOWN STRL REUS W/TWL LRG LVL3 (GOWN DISPOSABLE) ×2
GOWN STRL REUS W/TWL XL LVL3 (GOWN DISPOSABLE) ×1
HANDLE YANKAUER SUCT BULB TIP (MISCELLANEOUS) ×2 IMPLANT
KIT RM TURNOVER STRD PROC AR (KITS) ×2 IMPLANT
LABEL OR SOLS (LABEL) IMPLANT
NS IRRIG 1000ML POUR BTL (IV SOLUTION) ×2 IMPLANT
PACK EXTREMITY ARMC (MISCELLANEOUS) ×2 IMPLANT
PAD ABD DERMACEA PRESS 5X9 (GAUZE/BANDAGES/DRESSINGS) ×4 IMPLANT
PAD PREP 24X41 OB/GYN DISP (PERSONAL CARE ITEMS) ×2 IMPLANT
SLEEVE PROTECTION STRL DISP (MISCELLANEOUS) ×2 IMPLANT
SPONGE LAP 18X18 5 PK (GAUZE/BANDAGES/DRESSINGS) ×2 IMPLANT
STAPLER SKIN PROX 35W (STAPLE) ×2 IMPLANT
STOCKINETTE M/LG 89821 (MISCELLANEOUS) ×2 IMPLANT
SUT SILK 2 0 (SUTURE) ×1
SUT SILK 2 0 SH (SUTURE) ×6 IMPLANT
SUT SILK 2-0 18XBRD TIE 12 (SUTURE) ×1 IMPLANT
SUT SILK 3 0 (SUTURE) ×1
SUT SILK 3-0 18XBRD TIE 12 (SUTURE) ×1 IMPLANT
SUT VIC AB 0 CT1 36 (SUTURE) ×4 IMPLANT
SUT VIC AB 2-0 CT1 (SUTURE) ×8 IMPLANT

## 2017-01-12 NOTE — Anesthesia Post-op Follow-up Note (Signed)
Anesthesia QCDR form completed.        

## 2017-01-12 NOTE — Progress Notes (Signed)
[name] at [name] NAME: [name]    MR#:  [id number]  DATE OF BIRTH:  24-Feb-1935  SUBJECTIVE:  CHIEF COMPLAINT:   Chief Complaint  Patient presents with  . Altered Mental Status   The patient has 82 year old Caucasian male with medical history significant for cerebrovascular disease, status post recent left foot 3 toe amputation, with who was brought to the hospital with altered mental status shortness of breath and wheezing. Patient complained of significant left foot pain. He was noted to have second toe gangrene and was admitted to the hospital. While in the emergency room, patient was noted to be inA. Fib, RVR chest x-ray revealed congestive heart failure. Patient was diuresed of about 3.9 liters since admission. He feels somewhat uncomfortable due to nausea, but denies any pain in lower extremity. Admits of some constipation. Heart rate has improved on Toprol and Cardizem CD, fluctuating between 55-104, remains in atrial fibrillation, blood pressure stable. Patient is on 2 L of oxygen nasal cannula, O2 sats are 96-97%. Patient underwent left BKA earlier today, seen after operation. He is somnolent, not able to review systems  Review of Systems  Unable to perform ROS: Mental acuity    VITAL SIGNS: Blood pressure (!) 141/84, pulse (!) 102, temperature 98.6 F (37 C), resp. rate 17, height 5\' 7"  (1.702 m), weight 76.6 kg (168 lb 12.8 oz), SpO2 95 %.  PHYSICAL EXAMINATION:   GENERAL:  82 y.o.-year-old patient lying in the bed with no acute distress. Somnolent , nonverbal EYES: Pupils equal, round, reactive to light and accommodation. No scleral icterus. Extraocular muscles intact.  HEENT: Head atraumatic, normocephalic. Oropharynx and nasopharynx clear.  NECK:  Supple, no jugular venous distention. No thyroid enlargement, no tenderness.  LUNGS: Diminished breath sounds bilaterally, no wheezing, rales,rhonchi or  crepitation. No use of accessory muscles of respiration. Poor inspiratory effort  CARDIOVASCULAR: S1, S2  Irregularly irregular. No murmurs, rubs, or gallops.  ABDOMEN: Soft, nontender, nondistended. Bowel sounds present. No organomegaly or mass.  EXTREMITIES: No pedal edema, cyanosis, or clubbing. Left BKA NEUROLOGIC: Cranial nerves II through XII are intact. Muscle strength 5/5 in all extremities. Sensation intact. Gait not checked.  PSYCHIATRIC: The patient is sleeping , unable to assess orientation SKIN: No obvious rash, lesion, or ulcer.   ORDERS/RESULTS REVIEWED:   CBC  Recent Labs Lab 01/08/17 1001 01/09/17 0516 01/12/17 0624  WBC 14.1* 13.8* 13.5*  HGB 12.0* 11.7* 13.2  HCT 35.6* 34.8* 40.1  PLT 354 304 333  MCV 83.2 83.4 84.2  MCH 28.1 28.0 27.7  MCHC 33.7 33.5 32.9  RDW 16.9* 16.8* 16.8*  LYMPHSABS 1.1  --   --   MONOABS 0.8  --   --   EOSABS 0.2  --   --   BASOSABS 0.1  --   --    ------------------------------------------------------------------------------------------------------------------  Chemistries   Recent Labs Lab 01/08/17 1001 01/09/17 0516 01/09/17 1604 01/10/17 0432 01/12/17 0624  NA 139 136  --  138 135  K 4.1 3.2* 3.9 4.2 5.0  CL 104 103  --  101 101  CO2 25 28  --  27 28  GLUCOSE 149* 127*  --  130* 133*  BUN 19 15  --  16 21*  CREATININE 0.84 0.85  --  0.90 0.98  CALCIUM 8.5* 8.2*  --  8.6* 8.7*  MG  --  1.7  --   --   --   AST 22  --   --   --   --  ALT 19  --   --   --   --   ALKPHOS 162*  --   --   --   --   BILITOT 1.0  --   --   --   --    ------------------------------------------------------------------------------------------------------------------ estimated creatinine clearance is 57.1 mL/min (by C-G formula based on SCr of 0.98 mg/dL). ------------------------------------------------------------------------------------------------------------------ No results for input(s): TSH, T4TOTAL, T3FREE, THYROIDAB in the last 72  hours.  Invalid input(s): FREET3  Cardiac Enzymes No results for input(s): CKMB, TROPONINI, MYOGLOBIN in the last 168 hours.  Invalid input(s): CK ------------------------------------------------------------------------------------------------------------------ Invalid input(s): POCBNP ---------------------------------------------------------------------------------------------------------------  RADIOLOGY: Dg Chest Port 1 View  Result Date: 01/12/2017 CLINICAL DATA:  Hypoxia. History of CHF, diabetes, atrial fibrillation, prostate malignancy, former smoker. EXAM: PORTABLE CHEST 1 VIEW COMPARISON:  PA and lateral chest x-ray of January 08, 2017 FINDINGS: The lungs are adequately inflated. The lung markings remain coarse in the left retrocardiac region. The cardiac silhouette remains enlarged. The pulmonary vascularity remains engorged. The interstitial markings are less conspicuous today. There is calcification in the thoracic aortic arch. The observed bony thorax is unremarkable. IMPRESSION: Mild interval improvement in pulmonary interstitial edema secondary to CHF. Persistent left lower lobe atelectasis or pneumonia. A small left pleural effusion is suspected. Thoracic aortic atherosclerosis. Electronically Signed   By: David  Martinique M.D.   On: 01/12/2017 07:24    EKG:  Orders placed or performed during the hospital encounter of 10/12/16  . EKG 12-Lead  . EKG 12-Lead    ASSESSMENT AND PLAN:  Active Problems:   Gangrene ([place]) #1. 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 Lucky Cowboy, . Continue broad-spectrum antibiotic therapy per surgery recommendations, supportive therapy, pain medications, PT #2. Chronic A. Fib, RVR, continue patient on Cardizem,  Toprol-XL 130-150 today, continue metoprolol intravenously as needed, following closely, appreciate cardiology's input #3. Acute  diastolic CHF, patient is net negative of about 4.6 L , continue oxygen therapy, chest  x-ray today revealed improvement of CHF, continue  Lasix intravenously until able to take orally, wean off oxygen as tolerated  #4hypokalemia, supplemented orally, magnesium level was found to be a relatively low at 1.7, supplemented orally as well, levels are normal , follow with diuresis #5. Leukocytosis,  stable at present, follow after amputation  #6. Lactic acidosis, due to sepsis, resolved #7 Sepsis due to left foot second toe gangrene, continue patient on broad-spectrum antibiotic therapy for 48 hours postoperatively, or per vascular surgery, blood cultures are negative so far #8 nausea, supportive therapy, Protonix, follow clinically   Management plans discussed with the patient, numerous family members who are in the room today and they are in agreement.   DRUG ALLERGIES:  Allergies  Allergen Reactions  . Contrast Media [Iodinated Diagnostic Agents] Shortness Of Breath    SOB  . Iohexol Shortness Of Breath     Desc: Respiratory Distress, Laryngedema, diaphoresis, Onset Date: [id number]   . Latex Rash    CODE STATUS:     Code Status Orders        Start     Ordered   01/08/17 1342  Full code  Continuous     01/08/17 1342    Code Status History    Date Active Date Inactive Code Status Order ID Comments User Context   12/29/2016 10:32 PM 12/30/2016  4:34 PM Full Code [id number]  Lance Coon, MD ED   12/20/2016 12:51 PM 12/20/2016  6:42 PM Full Code [id number]  Leotis Pain  S, MD Inpatient   10/22/2016 12:40 AM 10/24/2016  8:58 PM Full Code [id number]  Lance Coon, MD Inpatient   10/14/2016 12:01 PM 10/14/2016  7:25 PM Full Code [id number]  Algernon Huxley, MD Inpatient   08/30/2016  1:38 PM 09/06/2016  7:41 PM Full Code [id number]  Epifanio Lesches, MD Inpatient   05/19/2016  4:54 AM 05/22/2016  7:07 PM Full Code [id number]  Saundra Shelling, MD Inpatient      TOTAL TIME TAKING CARE OF THIS PATIENT:  35 minutes.  . Discussed with Numerous family members who are in the room today, all  questions were answered, they voiced understanding  Webber Michiels M.D on 01/12/2017 at 1:51 PM  Between 7am to 6pm - Pager - [phone]  After 6pm go to [email or web address] - password [email or web address] [facility]  Office  [phone]  CC: Primary care physician; Alanson Aly, [place]

## 2017-01-12 NOTE — Progress Notes (Signed)
Pts HR sustaining in the 120's-130s. 10 mg IV Cardizem administered. HR down to 115 now. Will continue to monitor.

## 2017-01-12 NOTE — Op Note (Signed)
   OPERATIVE NOTE   PROCEDURE: Left below-the-knee amputation  PRE-OPERATIVE DIAGNOSIS: Left foot gangrene  POST-OPERATIVE DIAGNOSIS: same as above  SURGEON: Leotis Pain, MD  ASSISTANT(S): none  ANESTHESIA: general  ESTIMATED BLOOD LOSS: 50 cc  FINDING(S): none  SPECIMEN(S):  Left below-the-knee amputation  INDICATIONS:   [name] is a 82 y.o. male who presents with left leg gangrene.  The patient is scheduled for a left below-the-knee amputation.  I discussed in depth with the patient the risks, benefits, and alternatives to this procedure.  The patient is aware that the risk of this operation included but are not limited to:  bleeding, infection, myocardial infarction, stroke, death, failure to heal amputation wound, and possible need for more proximal amputation.  The patient is aware of the risks and agrees proceed forward with the procedure.  DESCRIPTION:  After full informed written consent was obtained from the patient, the patient was brought back to the operating room, and placed supine upon the operating table.  Prior to induction, the patient received IV antibiotics.  The patient was then prepped and draped in the standard fashion for a below-the-knee amputation.  After obtaining adequate anesthesia, the patient was prepped and draped in the standard fashion for a left below-the-knee amputation.  I marked out the anterior incision two finger breadths below the tibial tuberosity and then the marked out a posterior flap that was one third of the circumference of the calf in length.   I made the incisions for these flaps, and then dissected through the subcutaneous tissue, fascia, and muscle anteriorly.  I elevated  the periosteal tissue superiorly so that the tibia was about 3-4 cm shorter than the anterior skin flap.  I then transected the tibia with a power saw and then took a wedge off the tibia anteriorly with the power saw.  Then I smoothed out the rough edges.  In a  similar fashion, I cut back the fibula about two centimeters higher than the level of the tibia with a bone cutter.  I put a bone hook into the distal tibia and then used a large amputation knife to sharply develop a tissue plane through the muscle along the fibula.  In such fashion, the posterior flap was developed.  At this point, the specimen was passed off the field as the below-the-knee amputation.  At this point, I clamped all visibly bleeding arteries and veins using a combination of suture ligation with Silk suture and electrocautery.  Bleeding continued to be controlled with electrocautery and suture ligature.  The stump was washed off with sterile normal saline and no further active bleeding was noted.  I reapproximated the anterior and posterior fascia  with interrupted stitches of 0 Vicryl.  This was completed along the entire length of anterior and posterior fascia until there were no more loose space in the fascial line. I then placed a layer of 2-0 Vicryl sutures in the subcutaneous tissue. The skin was then  reapproximated with staples.  The stump was washed off and dried.  The incision was dressed with Xeroform and  then fluffs were applied.  Kerlix was wrapped around the leg and then gently an ACE wrap was applied.    COMPLICATIONS: none  CONDITION: stable   Leotis Pain  01/12/2017, 9:27 AM    This note was created with Dragon Medical transcription system. Any errors in dictation are purely unintentional.

## 2017-01-12 NOTE — Anesthesia Postprocedure Evaluation (Signed)
Anesthesia Post Note  Patient: [name]  Procedure(s) Performed: Procedure(s) (LRB): AMPUTATION BELOW KNEE (Left)  Patient location during evaluation: PACU Anesthesia Type: General Level of consciousness: awake and alert and oriented Pain management: pain level controlled Vital Signs Assessment: post-procedure vital signs reviewed and stable Respiratory status: spontaneous breathing, nonlabored ventilation and respiratory function stable Cardiovascular status: blood pressure returned to baseline and stable Postop Assessment: no signs of nausea or vomiting Anesthetic complications: no     Last Vitals:  Vitals:   01/12/17 0954 01/12/17 1022  BP: 134/87 (!) 144/91  Pulse: 69 (!) 131  Resp: 11 16  Temp: (!) 36.2 C 36.5 C  SpO2: 98% 100%    Last Pain:  Vitals:   01/12/17 1022  TempSrc: Oral  PainSc: Asleep                 Jameil Whitmoyer

## 2017-01-12 NOTE — H&P (Signed)
[facility] History & Physical Update  The patient was interviewed and re-examined.  The patient's previous History and Physical has been reviewed and is unchanged.  There is no change in the plan of care. We plan to proceed with the scheduled procedure.  Leotis Pain, MD  01/12/2017, 7:26 AM

## 2017-01-12 NOTE — Progress Notes (Addendum)
Pts HR sustaining in the 150s. 5mg  IV Lopressor given. No change in HR.

## 2017-01-12 NOTE — Anesthesia Procedure Notes (Signed)
Procedure Name: Intubation Date/Time: 01/12/2017 7:40 AM Performed by: Hedda Slade Pre-anesthesia Checklist: Patient identified, Patient being monitored, Timeout performed, Emergency Drugs available and Suction available Patient Re-evaluated:Patient Re-evaluated prior to induction Oxygen Delivery Method: Circle system utilized Preoxygenation: Pre-oxygenation with 100% oxygen Induction Type: IV induction Ventilation: Mask ventilation without difficulty Laryngoscope Size: Mac and 4 Grade View: Grade I Tube type: Oral Tube size: 7.5 mm Number of attempts: 1 Airway Equipment and Method: Stylet Placement Confirmation: ETT inserted through vocal cords under direct vision,  positive ETCO2 and breath sounds checked- equal and bilateral Secured at: 22 cm Tube secured with: Tape Dental Injury: Teeth and Oropharynx as per pre-operative assessment

## 2017-01-12 NOTE — Progress Notes (Signed)
Patient c/o pain despite PRN pain medications given. MD notified. ORders given for PRN morphine and Oxycodone IR, will give and continue to assess and monitor. MD notified of elevated HR as well into 140's, will give pain medication and see if it helps with HR, if not will notify MD and cardiology.

## 2017-01-12 NOTE — Progress Notes (Signed)
afib rate appears somewhat variable. Last pm was up to 150 however this am rate is  120-140. On metoprolol succinate 100  mg and cardizem 300 daily. Marland Kitchen Awaiting  Left bka.  Will add metoprolol tartrate 25 mg q 8 hr prn rate greater tthen 120

## 2017-01-12 NOTE — Transfer of Care (Signed)
Immediate Anesthesia Transfer of Care Note  Patient: [name]  Procedure(s) Performed: Procedure(s): AMPUTATION BELOW KNEE (Left)  Patient Location: PACU  Anesthesia Type:General  Level of Consciousness: awake  Airway & Oxygen Therapy: Patient Spontanous Breathing and Patient connected to face mask oxygen  Post-op Assessment: Report given to RN and Post -op Vital signs reviewed and stable  Post vital signs: Reviewed and stable  Last Vitals:  Vitals:   01/12/17 0908 01/12/17 0909  BP: (!) 138/93   Pulse: (!) 131   Resp: 12   Temp: 36.9 C (P) 36.9 C  SpO2: 100%     Last Pain:  Vitals:   01/12/17 0908  TempSrc: Temporal  PainSc:          Complications: No apparent anesthesia complications

## 2017-01-12 NOTE — Progress Notes (Signed)
Notified cardiology of uncontrolled HR into 150's despite PRNs. Order to give .25mg  IV Digoxin once. Will give and continue to monitor.

## 2017-01-12 NOTE — Anesthesia Preprocedure Evaluation (Addendum)
Anesthesia Evaluation  Patient identified by MRN, date of birth, ID band Patient awake    Reviewed: Allergy & Precautions, NPO status , Patient's Chart, lab work & pertinent test results  History of Anesthesia Complications Negative for: history of anesthetic complications  Airway Mallampati: III  TM Distance: >3 FB Neck ROM: Full    Dental  (+) Upper Dentures   Pulmonary shortness of breath, sleep apnea , former smoker,    breath sounds clear to auscultation- rhonchi (-) wheezing      Cardiovascular hypertension, Pt. on medications + Peripheral Vascular Disease and +CHF  + dysrhythmias Atrial Fibrillation  Rhythm:Regular Rate:Normal - Systolic murmurs and - Diastolic murmurs Echo 94/70/96: - Left ventricle: The cavity size was normal. Systolic function was   normal. The estimated ejection fraction was in the range of 60%   to 65%. - Aortic valve: Valve area (Vmax): 2.1 cm^2. - Mitral valve: There was moderate regurgitation. - Left atrium: The atrium was mildly dilated.   Neuro/Psych negative neurological ROS  negative psych ROS   GI/Hepatic negative GI ROS, Neg liver ROS,   Endo/Other  diabetes, Oral Hypoglycemic Agents  Renal/GU negative Renal ROS     Musculoskeletal  (+) Arthritis ,   Abdominal (+) - obese,   Peds  Hematology negative hematology ROS (+)   Anesthesia Other Findings Past Medical History: No date: Arthritis No date: Atrial fibrillation (HCC) No date: Carcinoma of prostate (HCC) No date: CHF (congestive heart failure) (HCC) No date: Chronic kidney disease No date: Diabetes mellitus without complication (HCC) No date: ED (erectile dysfunction) No date: Frequent urination No date: Hyperlipidemia No date: Hypertension No date: Moderate mitral insufficiency No date: Peripheral vascular disease ([place]) 04/2016: Pneumonia No date: Prostate cancer ([place]) No date: Sleep apnea     Comment:   OSA--USE C-PAP No date: Ulcer of left foot due to type 2 diabetes mellitus ([place]) No date: Urinary stress incontinence, male   Reproductive/Obstetrics                             Anesthesia Physical Anesthesia Plan  ASA: III  Anesthesia Plan: General   Post-op Pain Management:    Induction: Intravenous  PONV Risk Score and Plan: 1 and Ondansetron and Dexamethasone  Airway Management Planned: Oral ETT  Additional Equipment:   Intra-op Plan:   Post-operative Plan: Extubation in OR  Informed Consent: I have reviewed the patients History and Physical, chart, labs and discussed the procedure including the risks, benefits and alternatives for the proposed anesthesia with the patient or authorized representative who has indicated his/her understanding and acceptance.   Dental advisory given and Consent reviewed with POA  Plan Discussed with: CRNA and Anesthesiologist  Anesthesia Plan Comments:        Anesthesia Quick Evaluation

## 2017-01-13 LAB — BLOOD GAS, ARTERIAL
Acid-Base Excess: 5.8 mmol/L — ABNORMAL HIGH (ref 0.0–2.0)
Bicarbonate: 28.2 mmol/L — ABNORMAL HIGH (ref 20.0–28.0)
FIO2: 0.28
O2 Saturation: 99.2 %
PCO2 ART: 33 mmHg (ref 32.0–48.0)
PH ART: 7.54 — AB (ref 7.350–7.450)
PO2 ART: 125 mmHg — AB (ref 83.0–108.0)
Patient temperature: 37

## 2017-01-13 LAB — BASIC METABOLIC PANEL
ANION GAP: 7 (ref 5–15)
BUN: 19 mg/dL (ref 6–20)
CO2: 28 mmol/L (ref 22–32)
Calcium: 8.4 mg/dL — ABNORMAL LOW (ref 8.9–10.3)
Chloride: 98 mmol/L — ABNORMAL LOW (ref 101–111)
Creatinine, Ser: 1.03 mg/dL (ref 0.61–1.24)
GFR calc Af Amer: >60 mL/min (ref >60)
GLUCOSE: 154 mg/dL — AB (ref 65–99)
POTASSIUM: 4.5 mmol/L (ref 3.5–5.1)
Sodium: 133 mmol/L — ABNORMAL LOW (ref 135–145)

## 2017-01-13 LAB — GLUCOSE, CAPILLARY
GLUCOSE-CAPILLARY: 163 mg/dL — AB (ref 65–99)
GLUCOSE-CAPILLARY: 182 mg/dL — AB (ref 65–99)
GLUCOSE-CAPILLARY: 163 mg/dL — AB (ref 65–99)
Glucose-Capillary: 165 mg/dL — ABNORMAL HIGH (ref 65–99)
Glucose-Capillary: 182 mg/dL — ABNORMAL HIGH (ref 65–99)

## 2017-01-13 LAB — CBC
HEMATOCRIT: 37.8 % — AB (ref 40.0–52.0)
HEMOGLOBIN: 12.4 g/dL — AB (ref 13.0–18.0)
MCH: 27.3 pg (ref 26.0–34.0)
MCHC: 32.9 g/dL (ref 32.0–36.0)
MCV: 83 fL (ref 80.0–100.0)
Platelets: 332 10*3/uL (ref 150–440)
RBC: 4.56 MIL/uL (ref 4.40–5.90)
RDW: 16.8 % — AB (ref 11.5–14.5)
WBC: 15.4 10*3/uL — ABNORMAL HIGH (ref 3.8–10.6)

## 2017-01-13 LAB — SURGICAL PATHOLOGY

## 2017-01-13 LAB — CULTURE, BLOOD (ROUTINE X 2)
Culture: NO GROWTH
Culture: NO GROWTH
Special Requests: ADEQUATE
Special Requests: ADEQUATE

## 2017-01-13 LAB — VANCOMYCIN, TROUGH: Vancomycin Tr: 19 ug/mL (ref 15–20)

## 2017-01-13 MED ORDER — WARFARIN SODIUM 5 MG PO TABS
5.0000 mg | ORAL_TABLET | Freq: Every day | ORAL | Status: DC
  Administered 2017-01-14 – 2017-01-18 (×5): 5 mg via ORAL
  Filled 2017-01-13 (×6): qty 1

## 2017-01-13 MED ORDER — CHLORHEXIDINE GLUCONATE 0.12 % MT SOLN
15.0000 mL | Freq: Two times a day (BID) | OROMUCOSAL | Status: DC
  Administered 2017-01-13 – 2017-01-19 (×12): 15 mL via OROMUCOSAL
  Filled 2017-01-13 (×12): qty 15

## 2017-01-13 MED ORDER — SODIUM CHLORIDE 0.9% FLUSH
3.0000 mL | Freq: Two times a day (BID) | INTRAVENOUS | Status: DC
  Administered 2017-01-13 – 2017-01-15 (×5): 3 mL via INTRAVENOUS
  Administered 2017-01-18: 10 mL via INTRAVENOUS
  Administered 2017-01-19: 3 mL via INTRAVENOUS

## 2017-01-13 MED ORDER — WARFARIN - PHARMACIST DOSING INPATIENT
Freq: Every day | Status: DC
  Administered 2017-01-15 – 2017-01-18 (×3)

## 2017-01-13 MED ORDER — ENOXAPARIN SODIUM 40 MG/0.4ML ~~LOC~~ SOLN
40.0000 mg | SUBCUTANEOUS | Status: DC
  Administered 2017-01-13 – 2017-01-18 (×6): 40 mg via SUBCUTANEOUS
  Filled 2017-01-13 (×6): qty 0.4

## 2017-01-13 MED ORDER — NALOXONE HCL 0.4 MG/ML IJ SOLN: 0.4000 mg | Freq: Once | INTRAMUSCULAR | Status: DC

## 2017-01-13 MED ORDER — DIGOXIN 125 MCG PO TABS
0.1250 mg | ORAL_TABLET | Freq: Every day | ORAL | Status: DC
  Administered 2017-01-13 – 2017-01-19 (×7): 0.125 mg via ORAL
  Filled 2017-01-13 (×7): qty 1

## 2017-01-13 MED ORDER — FUROSEMIDE 10 MG/ML IJ SOLN: 20.0000 mg | Freq: Every day | INTRAMUSCULAR | Status: DC

## 2017-01-13 MED ORDER — ORAL CARE MOUTH RINSE
15.0000 mL | Freq: Two times a day (BID) | OROMUCOSAL | Status: DC
  Administered 2017-01-13 – 2017-01-15 (×4): 15 mL via OROMUCOSAL

## 2017-01-13 MED ORDER — NALOXONE HCL 0.4 MG/ML IJ SOLN
INTRAMUSCULAR | Status: AC
  Administered 2017-01-13: 0.4 mg
  Filled 2017-01-13: qty 1

## 2017-01-13 NOTE — NC FL2 (Signed)
[place] LEVEL OF CARE SCREENING TOOL     IDENTIFICATION  Patient Name: [name] Birthdate: 01-29-1935 Sex: male Admission Date (Current Location): 01/08/2017  [place] and [place] Number:  Engineering geologist and Address:  [facility], [address], [place], [place] [address]      Provider Number: [id number]  Attending Physician Name and Address:  Theodoro Grist, MD  Relative Name and Phone Number:       Current Level of Care: Hospital Recommended Level of Care: [place] Prior Approval Number:    Date Approved/Denied:   PASRR Number:  ([id number] )  Discharge Plan: SNF    Current Diagnoses: Patient Active Problem List   Diagnosis Date Noted  . Gangrene ([place]) 01/08/2017  . 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 Diabetic foot ulcer ([place]) 12/29/2016  . Sepsis ([place]) 12/29/2016  . Ataxia 10/21/2016  . History of femoral angiogram 09/06/2016  . Left leg pain 09/06/2016  . Leukocytosis 09/06/2016  . Generalized weakness 09/06/2016  . Acute renal injury ([place]) 09/06/2016  . A-fib ([place]) 08/30/2016  . Pneumonia 05/19/2016  . Hyperlipidemia 04/20/2016  . Leg pain, left 04/19/2016  . Back pain 04/19/2016  . Type 2 diabetes mellitus treated with insulin ([place]) 04/19/2016  . Essential hypertension 04/19/2016  . PAD (peripheral artery disease) ([place]) 04/19/2016  . Erectile dysfunction following radical prostatectomy 06/25/2015  . History of prostate cancer 12/23/2014  . Incontinence 12/23/2014    Orientation RESPIRATION BLADDER Height & Weight     Self  O2 (2 Liters Oxygen ) Continent Weight: 168 lb 12.8 oz (76.6 kg) Height:  5\' 7"  (170.2 cm)  BEHAVIORAL SYMPTOMS/MOOD NEUROLOGICAL BOWEL NUTRITION STATUS      Continent Diet (Diet: Carb Modified )  AMBULATORY STATUS COMMUNICATION OF NEEDS Skin   Extensive Assist Verbally Surgical wounds (New BKA Incision: Left Leg. )                        Personal Care Assistance Level of Assistance  Bathing, Feeding, Dressing Bathing Assistance: Limited assistance Feeding assistance: Independent Dressing Assistance: Limited assistance     Functional Limitations Info  Sight, Hearing, Speech Sight Info: Adequate Hearing Info: Adequate Speech Info: Adequate    SPECIAL CARE FACTORS FREQUENCY  PT (By licensed PT), OT (By licensed OT)     PT Frequency:  (5) OT Frequency:  (5)            Contractures      Additional Factors Info  Code Status, Allergies Code Status Info:  (Full Code. ) Allergies Info:  (Contrast Media Iodinated Diagnostic Agents, Iohexol, Latex)           Current Medications (01/13/2017):  This is the current hospital active medication list Current Facility-Administered Medications  Medication Dose Route Frequency Provider Last Rate Last Dose  . acetaminophen (TYLENOL) tablet 650 mg  650 mg Oral Q6H PRN Loletha Grayer, MD       Or  . acetaminophen (TYLENOL) suppository 650 mg  650 mg Rectal Q6H PRN Wieting, Richard, MD      . atorvastatin (LIPITOR) tablet 80 mg  80 mg Oral q1800 Loletha Grayer, MD   80 mg at 01/11/17 1708  . chlorhexidine (PERIDEX) 0.12 % solution 15 mL  15 mL Mouth Rinse BID Theodoro Grist, MD   15 mL at 01/13/17 0808  . digoxin (LANOXIN) tablet 0.125 mg  0.125 mg Oral Daily Bartholome Bill  A, MD   0.125 mg at 01/13/17 0954  . diltiazem (CARDIZEM CD) 24 hr capsule 300 mg  300 mg Oral Daily Loletha Grayer, MD   300 mg at 01/13/17 6761  . diltiazem (CARDIZEM) injection 10 mg  10 mg Intravenous Q4H PRN Saundra Shelling, MD   10 mg at 01/12/17 2104  . enoxaparin (LOVENOX) injection 40 mg  40 mg Subcutaneous Q24H Algernon Huxley, MD      . insulin aspart (novoLOG) injection 0-5 Units  0-5 Units Subcutaneous QHS Loletha Grayer, MD   3 Units at 01/09/17 2133  . insulin aspart (novoLOG) injection 0-9 Units  0-9 Units Subcutaneous TID WC Loletha Grayer, MD   2 Units at  01/13/17 0809  . insulin glargine (LANTUS) injection 10 Units  10 Units Subcutaneous QHS Loletha Grayer, MD   10 Units at 01/11/17 2151  . MEDLINE mouth rinse  15 mL Mouth Rinse q12n4p Theodoro Grist, MD      . metoprolol succinate (TOPROL-XL) 24 hr tablet 100 mg  100 mg Oral Daily Loletha Grayer, MD   100 mg at 01/13/17 9509  . metoprolol succinate (TOPROL-XL) 24 hr tablet 50 mg  50 mg Oral QPM Theodoro Grist, MD   50 mg at 01/11/17 1709  . metoprolol tartrate (LOPRESSOR) injection 5 mg  5 mg Intravenous Q1H PRN Loletha Grayer, MD   5 mg at 01/12/17 1343  . morphine 4 MG/ML injection 4 mg  4 mg Intravenous Q3H PRN Theodoro Grist, MD   4 mg at 01/12/17 2008  . naloxone [facility]) injection 0.4 mg  0.4 mg Intravenous Once Theodoro Grist, MD      . ondansetron (ZOFRAN) tablet 4 mg  4 mg Oral Q6H PRN Wieting, Richard, MD       Or  . ondansetron [facility]) injection 4 mg  4 mg Intravenous Q6H PRN Loletha Grayer, MD   4 mg at 01/11/17 1155  . oxyCODONE (Oxy IR/ROXICODONE) immediate release tablet 5 mg  5 mg Oral Q6H PRN Theodoro Grist, MD   5 mg at 01/13/17 0808  . pantoprazole (PROTONIX) EC tablet 40 mg  40 mg Oral Daily Theodoro Grist, MD   40 mg at 01/13/17 3267  . sodium chloride flush (NS) 0.9 % injection 3 mL  3 mL Intravenous Q12H Theodoro Grist, MD      . Derrill Memo ON 01/14/2017] warfarin (COUMADIN) tablet 5 mg  5 mg Oral q1800 Algernon Huxley, MD      . Derrill Memo ON 01/14/2017] Warfarin - Pharmacist Dosing Inpatient   Does not apply [id number] Algernon Huxley, MD         Discharge Medications: Please see discharge summary for a list of discharge medications.  Relevant Imaging Results:  Relevant Lab Results:   Additional Information  (SSN: [id number])  Albana Saperstein, Veronia Beets, LCSW

## 2017-01-13 NOTE — Progress Notes (Signed)
[name]  Daily Progress Note   Subjective  - 1 Day Post-Op  MS better than earlier, but still not great. When asked about pain he says "No pain". But when I try to straighten his knee he grimaces and will not leg me straighten it. Labs reviewed and OK No major events HR better  Objective Vitals:   01/13/17 0335 01/13/17 0745 01/13/17 1054 01/13/17 1055  BP: 124/88 (!) 144/87 107/71 110/62  Pulse: 80 84  93  Resp: 18 16    Temp: 97.6 F (36.4 C) 98.6 F (37 C)    TempSrc: Oral Oral    SpO2: 93% 99% 95% 94%  Weight:      Height:        Intake/Output Summary (Last 24 hours) at 01/13/17 1336 Last data filed at 01/13/17 0600  Gross per 24 hour  Intake                0 ml  Output             1450 ml  Net            -1450 ml    PULM  CTAB CV  RRR VASC  Dressing C/D/I. Knee contracted today  Laboratory CBC    Component Value Date/Time   WBC 15.4 (H) 01/13/2017 0508   HGB 12.4 (L) 01/13/2017 0508   HGB 16.3 01/20/2013 0255   HCT 37.8 (L) 01/13/2017 0508   HCT 47.9 01/20/2013 0255   PLT 332 01/13/2017 0508   PLT 245 01/20/2013 0255    BMET    Component Value Date/Time   NA 133 (L) 01/13/2017 0508   NA 140 01/20/2013 0255   K 4.5 01/13/2017 0508   K 3.5 01/20/2013 0255   CL 98 (L) 01/13/2017 0508   CL 103 01/20/2013 0255   CO2 28 01/13/2017 0508   CO2 30 01/20/2013 0255   GLUCOSE 154 (H) 01/13/2017 0508   GLUCOSE 145 (H) 01/20/2013 0255   BUN 19 01/13/2017 0508   BUN 22 (H) 01/20/2013 0255   CREATININE 1.03 01/13/2017 0508   CREATININE 1.09 01/20/2013 0255   CALCIUM 8.4 (L) 01/13/2017 0508   CALCIUM 9.4 01/20/2013 0255   GFRNONAA >60 01/13/2017 0508   GFRNONAA >60 01/20/2013 0255   GFRAA >60 01/13/2017 0508   GFRAA >60 01/20/2013 0255    Assessment/Planning: POD #1 s/p left BKA   Needs to straighten knee or he will get a contracture  PT to see patient now that MS better  OK to start DVT prophylaxis/anticoagulation  from my point of view  Abx stopped      Leotis Pain  01/13/2017, 1:36 PM

## 2017-01-13 NOTE — Progress Notes (Signed)
PT Cancellation Note  Patient Details Name: [name] MRN: [id number] DOB: 04/29/1935   Cancelled Treatment:    Reason Eval/Treat Not Completed: Other (comment) Consult received and chart reviewed. Currently unable to evaluate/treat, as pt is unresponsive; attempted to reposition L residual limb, as pt in excessive knee flex and hip abd and ER. Upon moving residual limb, pt highly resistive and yelling out in pain. Pt immediately back to sleep and unresponsive to sternal nudge. Dr. Clayton Bibles arrived as SPT exiting; both her and nursing are aware and responding to pt unresponsiveness. Pt HR in the 80's and O2 sat at 97%. BP at 107/72mmHg.  Will re-attempt, in the am, if medically appropriate.    Bevelyn Ngo 01/13/2017, 10:47 AM

## 2017-01-13 NOTE — Clinical Social Work Note (Signed)
Clinical Social Work Assessment  Patient Details  Name: [name] MRN: [id number] Date of Birth: [birth year]  Date of referral:  01/13/17               Reason for consult:  Facility Placement                Permission sought to share information with:  Chartered certified accountant granted to share information::  Yes, Verbal Permission Granted  Name::      [place]::   [place]   Relationship::     Contact Information:     Housing/Transportation Living arrangements for the past 2 months:  Single Family Home Source of Information:  Spouse Patient Interpreter Needed:  None Criminal Activity/Legal Involvement Pertinent to Current Situation/Hospitalization:  No - Comment as needed Significant Relationships:  Adult Children, Spouse Lives with:  Spouse Do you feel safe going back to the place where you live?    Need for family participation in patient care:  Yes (Comment)  Care giving concerns:  Patient lives in [place] with his wife Haidyn [place].    Social Worker assessment / plan:  Holiday representative (CSW) reviewed chart and noted that patient is a new BKA and PT is pending. Per RN case manager patient will likely need SNF. Per chart patient is not alert and oriented. CSW contacted patient's wife Everlene Farrier and explained that PT will recommend SNF or home health. Per wife she prefers SNF and would like for patient to go to [facility] in [place]. Per wife patient has been to [facility] in the past. CSW explained that insurance will pay for SNF if PT recommends it. Wife verbalized her understanding and is agreeable to SNF search in [place]. FL2 complete and faxed out. CSW will continue to follow and assist as needed.   Employment status:  Disabled (Comment on whether or not currently receiving Disability) Insurance information:  Managed Medicare PT Recommendations:  Not assessed at this time Information / Referral to community  resources:  [place]  Patient/Family's Response to care:  Patient's wife prefers SNF.   Patient/Family's Understanding of and Emotional Response to Diagnosis, Current Treatment, and Prognosis:  Patient's wife was very pleasant and thanked CSW for assistance.   Emotional Assessment Appearance:  Appears stated age Attitude/Demeanor/Rapport:  Unable to Assess Affect (typically observed):  Unable to Assess Orientation:  Oriented to Self, Fluctuating Orientation (Suspected and/or reported Sundowners) Alcohol / Substance use:  Not Applicable Psych involvement (Current and /or in the community):  No (Comment)  Discharge Needs  Concerns to be addressed:  Discharge Planning Concerns Readmission within the last 30 days:  No Current discharge risk:  Dependent with Mobility, Cognitively Impaired, Chronically ill Barriers to Discharge:  Continued Medical Work up   [employer], Veronia Beets, LCSW 01/13/2017, 3:30 PM

## 2017-01-13 NOTE — Clinical Social Work Placement (Addendum)
   CLINICAL SOCIAL WORK PLACEMENT  NOTE  Date:  01/13/2017  Patient Details  Name: [name] MRN: [id number] Date of Birth: Aug 10, 1934  Clinical Social Work is seeking post-discharge placement for this patient at the [place] level of care (*CSW will initial, date and re-position this form in  chart as items are completed):  Yes   Patient/family provided with [place] Work Department's list of facilities offering this level of care within the geographic area requested by the patient (or if unable, by the patient's family).  Yes   Patient/family informed of their freedom to choose among providers that offer the needed level of care, that participate in Medicare, Medicaid or managed care program needed by the patient, have an available bed and are willing to accept the patient.  Yes   Patient/family informed of 's ownership interest in [facility] and [facility], as well as of the fact that they are under no obligation to receive care at these facilities.  PASRR submitted to EDS on       PASRR number received on       Existing PASRR number confirmed on 01/13/17     FL2 transmitted to all facilities in geographic area requested by pt/family on 01/13/17     FL2 transmitted to all facilities within larger geographic area on       Patient informed that his/her managed care company has contracts with or will negotiate with certain facilities, including the following:         01-17-17   Patient/family informed of bed offers received.  Patient chooses bed at  [facility]   (updated Evette Cristal, MSW, [place], 01-19-17)  Physician recommends and patient chooses bed at     Patient to be transferred to   [facility]  on  01-19-17.  (updated Evette Cristal, MSW, [place], 01-19-17)  Patient to be transferred to facility by  [facility]  (updated Evette Cristal, MSW, [place], 01-19-17)     Patient  family notified on  01-19-17 of transfer.  (updated Evette Cristal, MSW, [place], 01-19-17)  Name of family member notified:   Santiago Glad, patient's wife  (updated Evette Cristal, MSW, [place], 01-19-17)     PHYSICIAN       Additional Comment:    _______________________________________________ Sample, Veronia Beets, LCSW 01/13/2017, 3:21 PM    (updated Evette Cristal, MSW, [place], 01-19-17)

## 2017-01-13 NOTE — Progress Notes (Signed)
Clinical Social Worker (CSW) met with patient and his wife Everlene Farrier was at bedside. CSW presented bed offers to wife and she chose [facility]. [facility] admissions coordinator at [facility] is aware of accepted bed offer. CSW will continued to follow and assist as needed.   [employer], LCSW [phone]

## 2017-01-13 NOTE — Progress Notes (Signed)
ANTICOAGULATION CONSULT NOTE - Initial Consult  Pharmacy Consult for VKA Indication: AF  Allergies  Allergen Reactions  . Contrast Media [Iodinated Diagnostic Agents] Shortness Of Breath    SOB  . Iohexol Shortness Of Breath     Desc: Respiratory Distress, Laryngedema, diaphoresis, Onset Date: [id number]   . Latex Rash    Patient Measurements: Height: 5\' 7"  (170.2 cm) Weight: 168 lb 12.8 oz (76.6 kg) IBW/kg (Calculated) : 66.1 Heparin Dosing Weight:   Vital Signs: Temp: 98.6 F (37 C) (08/23 0745) Temp Source: Oral (08/23 0745) BP: 110/62 (08/23 1055) Pulse Rate: 93 (08/23 1055)  Labs:  Recent Labs  01/12/17 0624 01/13/17 0508  HGB 13.2 12.4*  HCT 40.1 37.8*  PLT 333 332  LABPROT 16.9*  --   INR 1.36  --   CREATININE 0.98 1.03    Estimated Creatinine Clearance: 54.4 mL/min (by C-G formula based on SCr of 1.03 mg/dL).   Medical History: Past Medical History:  Diagnosis Date  . Arthritis   . Atrial fibrillation ([place])   . Carcinoma of prostate ([place])   . CHF (congestive heart failure) ([place])   . Chronic kidney disease   . Diabetes mellitus without complication ([place])   . ED (erectile dysfunction)   . Frequent urination   . Hyperlipidemia   . Hypertension   . Moderate mitral insufficiency   . Peripheral vascular disease ([place])   . Pneumonia 04/2016  . Prostate cancer ([place])   . Sleep apnea    OSA--USE C-PAP  . Ulcer of left foot due to type 2 diabetes mellitus ([place])   . Urinary stress incontinence, male     Medications:  Infusions:    Assessment: 81 yom POD 1 L BKA - per Dr. Lucky Cowboy okay to resume AC. Will give LMWH this evening and resume VKA tomorrow.   Goal of Therapy:  INR 2-3 Monitor platelets by anticoagulation protocol: Yes   Plan:  INR subtherapeutic (held for procedure) - will resume home dosing (tomorrow = 01/14/17) warfarin 5 mg po once daily and follow INR daily while IP.   Laural Benes, Pharm.D., BCPS Clinical  Pharmacist 01/13/2017,11:30 AM

## 2017-01-13 NOTE — Progress Notes (Signed)
[facility] [place] NAME: Author Hatlestad    MR#:  [id number]  DATE OF BIRTH:  [birth year]  SUBJECTIVE:  CHIEF COMPLAINT:   Chief Complaint  Patient presents with  . Altered Mental Status   The patient has 82 year old Caucasian male with medical history significant for cerebrovascular disease, status post recent left foot 3 toe amputation, with who was brought to the hospital with altered mental status shortness of breath and wheezing. Patient complained of significant left foot pain. He was noted to have second toe gangrene and was admitted to the hospital. While in the emergency room, patient was noted to be inA. Fib, RVR chest x-ray revealed congestive heart failure. Patient was diuresed of about 3.9 liters since admission. He feels somewhat uncomfortable due to nausea, but denies any pain in lower extremity. Admits of some constipation. Heart rate has improved on Toprol and Cardizem CD, fluctuating between 55-104, remains in atrial fibrillation, blood pressure stable. Patient is on 2 L of oxygen nasal cannula, O2 sats are 96-97%. Patient underwent left BKA Almost in the 65s and 18, now he was given some pain medications, unresponsive, unable to arouse him with pain. . ABGs did not show significant CO2 retention.  Review of Systems  Unable to perform ROS: Mental acuity    VITAL SIGNS: Blood pressure 110/62, pulse 93, temperature 98.6 F (37 C), temperature source Oral, resp. rate 16, height 5\' 7"  (1.702 m), weight 76.6 kg (168 lb 12.8 oz), SpO2 94 %.  PHYSICAL EXAMINATION:   GENERAL:  82 y.o.-year-old patient lying in the bed with no acute distress. Sleepy and arousable with verbal or painful stimulation EYES: Pupils equal, round, reactive to light and accommodation. No scleral icterus. Extraocular muscles intact.  HEENT: Head atraumatic, normocephalic. Oropharynx and nasopharynx clear.  NECK:  Supple, no jugular venous  distention. No thyroid enlargement, no tenderness.  LUNGS: Diminished breath sounds bilaterally, no wheezing, rales,rhonchi or crepitation. No use of accessory muscles of respiration. Poor inspiratory effort , intermittent apnea episodes CARDIOVASCULAR: S1, S2  Irregularly irregular. No murmurs, rubs, or gallops.  ABDOMEN: Soft, nontender, nondistended. Bowel sounds present. No organomegaly or mass.  EXTREMITIES: No pedal edema, cyanosis, or clubbing. Left BKA NEUROLOGIC: Cranial nerves II through XII are intact. Muscle strength 5/5 in all extremities. Sensation intact. Gait not checked.  PSYCHIATRIC: The patient is sleeping , unable to assess orientation SKIN: No obvious rash, lesion, or ulcer.   ORDERS/RESULTS REVIEWED:   CBC  Recent Labs Lab 01/08/17 1001 01/09/17 0516 01/12/17 0624 01/13/17 0508  WBC 14.1* 13.8* 13.5* 15.4*  HGB 12.0* 11.7* 13.2 12.4*  HCT 35.6* 34.8* 40.1 37.8*  PLT 354 304 333 332  MCV 83.2 83.4 84.2 83.0  MCH 28.1 28.0 27.7 27.3  MCHC 33.7 33.5 32.9 32.9  RDW 16.9* 16.8* 16.8* 16.8*  LYMPHSABS 1.1  --   --   --   MONOABS 0.8  --   --   --   EOSABS 0.2  --   --   --   BASOSABS 0.1  --   --   --    ------------------------------------------------------------------------------------------------------------------  Chemistries   Recent Labs Lab 01/08/17 1001 01/09/17 0516 01/09/17 1604 01/10/17 0432 01/12/17 0624 01/13/17 0508  NA 139 136  --  138 135 133*  K 4.1 3.2* 3.9 4.2 5.0 4.5  CL 104 103  --  101 101 98*  CO2 25 28  --  27 28 28   GLUCOSE 149*  127*  --  130* 133* 154*  BUN 19 15  --  16 21* 19  CREATININE 0.84 0.85  --  0.90 0.98 1.03  CALCIUM 8.5* 8.2*  --  8.6* 8.7* 8.4*  MG  --  1.7  --   --   --   --   AST 22  --   --   --   --   --   ALT 19  --   --   --   --   --   ALKPHOS 162*  --   --   --   --   --   BILITOT 1.0  --   --   --   --   --     ------------------------------------------------------------------------------------------------------------------ estimated creatinine clearance is 54.4 mL/min (by C-G formula based on SCr of 1.03 mg/dL). ------------------------------------------------------------------------------------------------------------------ No results for input(s): TSH, T4TOTAL, T3FREE, THYROIDAB in the last 72 hours.  Invalid input(s): FREET3  Cardiac Enzymes No results for input(s): CKMB, TROPONINI, MYOGLOBIN in the last 168 hours.  Invalid input(s): CK ------------------------------------------------------------------------------------------------------------------ Invalid input(s): POCBNP ---------------------------------------------------------------------------------------------------------------  RADIOLOGY: Dg Chest Port 1 View  Result Date: 01/12/2017 CLINICAL DATA:  Hypoxia. History of CHF, diabetes, atrial fibrillation, prostate malignancy, former smoker. EXAM: PORTABLE CHEST 1 VIEW COMPARISON:  PA and lateral chest x-ray of January 08, 2017 FINDINGS: The lungs are adequately inflated. The lung markings remain coarse in the left retrocardiac region. The cardiac silhouette remains enlarged. The pulmonary vascularity remains engorged. The interstitial markings are less conspicuous today. There is calcification in the thoracic aortic arch. The observed bony thorax is unremarkable. IMPRESSION: Mild interval improvement in pulmonary interstitial edema secondary to CHF. Persistent left lower lobe atelectasis or pneumonia. A small left pleural effusion is suspected. Thoracic aortic atherosclerosis. Electronically Signed   By: David  Martinique M.D.   On: 01/12/2017 07:24    EKG:  Orders placed or performed during the hospital encounter of 10/12/16  . EKG 12-Lead  . EKG 12-Lead    ASSESSMENT AND PLAN:  Active Problems:   Gangrene ([place]) #1. Left foot second toe gangrene, Status post left BKA 01/12/2017 by   Dr. Lucky Cowboy, . Of antibiotic therapy per surgery recommendations, supportive therapy, pain medications, PT, Dr. dew recommends to straighten knee to avoid contractions. Continue pain medications, watching patient's mental status closely #2. Chronic A. Fib, RVR, continue patient on Cardizem,  Toprol-XL, heart rate has improved from 93-115 on pain medications, discussed with Dr. Ubaldo Glassing,  continue metoprolol intravenously as needed, following closely, appreciate cardiology's input #3. Acute  diastolic CHF, patient is net negative of about 6.0 L , continue oxygen therapy, chest x-ray revealed improvement of CHF, hold Lasix today until patient is able to take good oral intake , weaning off oxygen as tolerated, now on 2 L of oxygen nasal cannula, O2 sats 94%  #4hypokalemia, supplemented orally, magnesium level was found to be a relatively low at 1.7, supplemented orally as well, levels are normal , follow with diuresis #5. Leukocytosis,  some worse today, likely due to amputation , stress #6. Lactic acidosis, due to sepsis, resolved #7 Sepsis due to left foot second toe gangrene, now off antibiotic therapy , as per vascular surgery, blood cultures are negative so far #8 nausea, supportive therapy, Protonix, follow clinically #9. Acute metabolic encephalopathy, not due to CO2 retention, likely due to opiates. Intravenously, judicial dosing of opiates for pain is recommended. Discussed with cardiologist  Management plans discussed with the patient, numerous family members who are in  the room today and they are in agreement.   DRUG ALLERGIES:  Allergies  Allergen Reactions  . Contrast Media [Iodinated Diagnostic Agents] Shortness Of Breath    SOB  . Iohexol Shortness Of Breath     Desc: Respiratory Distress, Laryngedema, diaphoresis, Onset Date: [id number]   . Latex Rash    CODE STATUS:     Code Status Orders        Start     Ordered   01/08/17 1342  Full code  Continuous     01/08/17 1342    Code  Status History    Date Active Date Inactive Code Status Order ID Comments User Context   12/29/2016 10:32 PM 12/30/2016  4:34 PM Full Code [id number]  Lance Coon, MD ED   12/20/2016 12:51 PM 12/20/2016  6:42 PM Full Code [id number]  Algernon Huxley, MD Inpatient   10/22/2016 12:40 AM 10/24/2016  8:58 PM Full Code [id number]  Lance Coon, MD Inpatient   10/14/2016 12:01 PM 10/14/2016  7:25 PM Full Code [id number]  Algernon Huxley, MD Inpatient   08/30/2016  1:38 PM 09/06/2016  7:41 PM Full Code [id number]  Epifanio Lesches, MD Inpatient   05/19/2016  4:54 AM 05/22/2016  7:07 PM Full Code [id number]  Saundra Shelling, MD Inpatient      TOTAL TIME TAKING CARE OF THIS PATIENT:  35 minutes.  . Discussed with Patient's wife Anzley Dibbern M.D on 01/13/2017 at 1:53 PM  Between 7am to 6pm - Pager - [phone]  After 6pm go to [email or web address] - password [email or web address]  Office  [phone]  CC: Primary care physician; Alanson Aly, [place]

## 2017-01-13 NOTE — Progress Notes (Signed)
Hourly rounded performed, patient diaphoretic, responsive to sternal rub. Alert to self. Blood sugar obtained, 182. Wife at bedside. HR 108-120. Will continue to monitor.

## 2017-01-13 NOTE — Progress Notes (Signed)
PT Cancellation Note  Patient Details Name: MELODY SAVIDGE MRN: [id number] DOB: 1934/12/07   Cancelled Treatment:    Reason Eval/Treat Not Completed: Other (comment) Consult received and chart reviewed. This is the second attempt for the day. Pt is no longer lethargic, but is currently unable to evaluate/treat, secondary to confusion, inability to follow commands and high pain levels/resistance to PROM to L residual limb. Pt able to recite first name, but unable to answer further questions. Pt L knee in flexion and L hip in abd and ER; attempted to position pt L LE on pillow with L knee in ext, but pt hollared in pain and begged SPT to stop. Discussed POC with nursing, and recommended possible use of bone foam and/or knee immobilization brace to achieve full L knee ROM. Will re-attempt, in am.    Bevelyn Ngo 01/13/2017, 4:09 PM

## 2017-01-13 NOTE — Progress Notes (Signed)
Patient c/o pain, facial grimace noted. Patient refuses to take PO meds at this time. PRN 4mg  IV morphine available, MD paged to see if  patient can receive half the dose instead.   Mentation not at baseline per family.

## 2017-01-13 NOTE — Progress Notes (Signed)
       [facility]  SUBJECTIVE: left leg pain.   Vitals:   01/12/17 1541 01/12/17 1918 01/13/17 0335 01/13/17 0745  BP: (!) 148/119 (!) 145/80 124/88 (!) 144/87  Pulse: (!) 135 96 80 84  Resp: 20 18 18 16   Temp:  98.6 F (37 C) 97.6 F (36.4 C) 98.6 F (37 C)  TempSrc:  Oral Oral Oral  SpO2:  100% 93% 99%  Weight:      Height:        Intake/Output Summary (Last 24 hours) at 01/13/17 0801 Last data filed at 01/13/17 0600  Gross per 24 hour  Intake             1100 ml  Output             1495 ml  Net             -395 ml    LABS: Basic Metabolic Panel:  Recent Labs  01/12/17 0624 01/13/17 0508  NA 135 133*  K 5.0 4.5  CL 101 98*  CO2 28 28  GLUCOSE 133* 154*  BUN 21* 19  CREATININE 0.98 1.03  CALCIUM 8.7* 8.4*   Liver Function Tests: No results for input(s): AST, ALT, ALKPHOS, BILITOT, PROT, ALBUMIN in the last 72 hours. No results for input(s): LIPASE, AMYLASE in the last 72 hours. CBC:  Recent Labs  01/12/17 0624 01/13/17 0508  WBC 13.5* 15.4*  HGB 13.2 12.4*  HCT 40.1 37.8*  MCV 84.2 83.0  PLT 333 332   Cardiac Enzymes: No results for input(s): CKTOTAL, CKMB, CKMBINDEX, TROPONINI in the last 72 hours. BNP: Invalid input(s): POCBNP D-Dimer: No results for input(s): DDIMER in the last 72 hours. Hemoglobin A1C: No results for input(s): HGBA1C in the last 72 hours. Fasting Lipid Panel: No results for input(s): CHOL, HDL, LDLCALC, TRIG, CHOLHDL, LDLDIRECT in the last 72 hours. Thyroid Function Tests: No results for input(s): TSH, T4TOTAL, T3FREE, THYROIDAB in the last 72 hours.  Invalid input(s): FREET3 Anemia Panel: No results for input(s): VITAMINB12, FOLATE, FERRITIN, TIBC, IRON, RETICCTPCT in the last 72 hours.   Physical Exam: Blood pressure (!) 144/87, pulse 84, temperature 98.6 F (37 C), temperature source Oral, resp. rate 16, height 5\' 7"  (1.702 m), weight 76.6 kg (168 lb 12.8 oz), SpO2 99  %.   Wt Readings from Last 1 Encounters:  01/12/17 76.6 kg (168 lb 12.8 oz)     General appearance: cooperative and moderate distress Resp: rhonchi bilaterally Cardio: irregularly irregular rhythm Extremities: s/p left aka  TELEMETRY: Reviewed telemetry pt in afib with variable but fairly rapid vr.  ASSESSMENT AND PLAN:  Active Problems:   Gangrene ([facility])-s/p l bka. Post op per Dr. Julious Oka with fairly rapid vr despite cardizem cd 300 daily and metoprolol succ 150 daily. Will add low dose digoxin 0.125 mg daily. Does not appear to be significantly anemic or volume depleted. Some pain which may be driving rate as well as moderate agitation.  Pt is hemodynamically stable at present.     Teodoro Spray, MD, [facility] 01/13/2017 8:01 AM

## 2017-01-14 LAB — GLUCOSE, CAPILLARY
GLUCOSE-CAPILLARY: 150 mg/dL — AB (ref 65–99)
GLUCOSE-CAPILLARY: 161 mg/dL — AB (ref 65–99)
Glucose-Capillary: 137 mg/dL — ABNORMAL HIGH (ref 65–99)
Glucose-Capillary: 134 mg/dL — ABNORMAL HIGH (ref 65–99)

## 2017-01-14 LAB — CBC
HEMATOCRIT: 37.2 % — AB (ref 40.0–52.0)
HEMOGLOBIN: 12.6 g/dL — AB (ref 13.0–18.0)
MCH: 28.2 pg (ref 26.0–34.0)
MCHC: 33.9 g/dL (ref 32.0–36.0)
MCV: 83.1 fL (ref 80.0–100.0)
Platelets: 322 10*3/uL (ref 150–440)
RBC: 4.48 MIL/uL (ref 4.40–5.90)
RDW: 16.8 % — AB (ref 11.5–14.5)
WBC: 15.3 10*3/uL — ABNORMAL HIGH (ref 3.8–10.6)

## 2017-01-14 LAB — PROTIME-INR
INR: 1.47
Prothrombin Time: 18 seconds — ABNORMAL HIGH (ref 11.4–15.2)

## 2017-01-14 LAB — SODIUM: SODIUM: 135 mmol/L (ref 135–145)

## 2017-01-14 MED ORDER — PREMIER PROTEIN SHAKE
11.0000 [oz_av] | Freq: Two times a day (BID) | ORAL | Status: DC
  Administered 2017-01-15 – 2017-01-19 (×5): 11 [oz_av] via ORAL

## 2017-01-14 MED ORDER — ADULT MULTIVITAMIN W/MINERALS CH
1.0000 | ORAL_TABLET | Freq: Every day | ORAL | Status: DC
  Administered 2017-01-15 – 2017-01-19 (×5): 1 via ORAL
  Filled 2017-01-14 (×5): qty 1

## 2017-01-14 MED ORDER — MORPHINE SULFATE (PF) 4 MG/ML IV SOLN
4.0000 mg | Freq: Once | INTRAVENOUS | Status: AC
  Administered 2017-01-14: 4 mg via INTRAVENOUS
  Filled 2017-01-14: qty 1

## 2017-01-14 MED ORDER — SENNOSIDES 8.8 MG/5ML PO SYRP
15.0000 mL | ORAL_SOLUTION | Freq: Every day | ORAL | Status: DC
  Administered 2017-01-14 – 2017-01-18 (×4): 15 mL via ORAL
  Filled 2017-01-14 (×6): qty 15

## 2017-01-14 MED ORDER — MORPHINE SULFATE (PF) 4 MG/ML IV SOLN
4.0000 mg | INTRAVENOUS | Status: DC | PRN
Start: 2017-01-14 — End: 2017-01-15
  Administered 2017-01-14 – 2017-01-15 (×2): 4 mg via INTRAVENOUS
  Filled 2017-01-14 (×3): qty 1

## 2017-01-14 NOTE — Care Management Important Message (Signed)
Important Message  Patient Details  Name: [name] MRN: [id number] Date of Birth: [birth year]   Medicare Important Message Given:  Yes Signed IM notice given    Katrina Stack, RN 01/14/2017, 3:19 PM

## 2017-01-14 NOTE — Progress Notes (Signed)
 Vein and Vascular Surgery  Daily Progress Note   Subjective  - 2 Days Post-Op  Sleeping but awakens MS remains fairly poor Left knee is completely contracted today and he resists straightening.  Objective Vitals:   01/14/17 0951 01/14/17 0954 01/14/17 1025 01/14/17 1140  BP: 116/71 (!) 119/94 122/73   Pulse: (!) 168 (!) 145    Resp:      Temp:      TempSrc:      SpO2:      Weight:    171 lb 12.8 oz (77.9 kg)  Height:        Intake/Output Summary (Last 24 hours) at 01/14/17 1213 Last data filed at 01/14/17 0300  Gross per 24 hour  Intake                0 ml  Output              725 ml  Net             -725 ml    PULM  CTAB CV  RRR VASC  Dressing C/D/I. Left knee contracted  Laboratory CBC    Component Value Date/Time   WBC 15.3 (H) 01/14/2017 0419   HGB 12.6 (L) 01/14/2017 0419   HGB 16.3 01/20/2013 0255   HCT 37.2 (L) 01/14/2017 0419   HCT 47.9 01/20/2013 0255   PLT 322 01/14/2017 0419   PLT 245 01/20/2013 0255    BMET    Component Value Date/Time   NA 135 01/14/2017 0419   NA 140 01/20/2013 0255   K 4.5 01/13/2017 0508   K 3.5 01/20/2013 0255   CL 98 (L) 01/13/2017 0508   CL 103 01/20/2013 0255   CO2 28 01/13/2017 0508   CO2 30 01/20/2013 0255   GLUCOSE 154 (H) 01/13/2017 0508   GLUCOSE 145 (H) 01/20/2013 0255   BUN 19 01/13/2017 0508   BUN 22 (H) 01/20/2013 0255   CREATININE 1.03 01/13/2017 0508   CREATININE 1.09 01/20/2013 0255   CALCIUM 8.4 (L) 01/13/2017 0508   CALCIUM 9.4 01/20/2013 0255   GFRNONAA >60 01/13/2017 0508   GFRNONAA >60 01/20/2013 0255   GFRAA >60 01/13/2017 0508   GFRAA >60 01/20/2013 0255    Assessment/Planning: POD #2 s/p left BKA   I think we should try to place the patient in a knee immobilizer because if he continues to contract his knee for several more days, this is going to be a permanent contraction and his below-knee amputation will be useless and need to be revised to an above-knee amputation.  I  have tried to stress the importance of straightening the knee to both the patient as well as his wife but at this point, there has been no effort to straighten the knee.  Physical therapy to see the patient as well and hopefully they can get his knee a little more straight.  Otherwise, he is doing quite well. His heart rate is actually better. His dressing can stay on for another couple of days. He has no signs of bleeding and I am okay to resume full anticoagulation at this point.  It appears that he will clearly need placement at discharge for several weeks of rehabilitation.    Leotis Pain  01/14/2017, 12:13 PM

## 2017-01-14 NOTE — Progress Notes (Signed)
[facility] NAME: [name]    MR#:  [id number]  DATE OF BIRTH:  Jan 04, 1935  SUBJECTIVE:  CHIEF COMPLAINT:   Chief Complaint  Patient presents with  . Altered Mental Status   The patient has 82 year old Caucasian male with medical history significant for cerebrovascular disease, status post recent left foot 3 toe amputation, with who was brought to the hospital with altered mental status shortness of breath and wheezing. Patient complained of significant left foot pain. He was noted to have second toe gangrene and was admitted to the hospital. While in the emergency room, patient was noted to be inA. Fib, RVR chest x-ray revealed congestive heart failure. Patient was diuresed of about 3.9 liters since admission. He feels somewhat uncomfortable due to nausea, but denies any pain in lower extremity. Admits of some constipation. Heart rate has improved on Toprol and Cardizem CD, fluctuating between 53-130, remains in atrial fibrillation, blood pressure stable. Patient is on 2 L of oxygen nasal cannula, O2 sats are 96-97%. Very uncomfortable, wringing in pain, requiring additional doses of morphine intravenously. The patient was seen by Dr. Lucky Cowboy today, felt that he may develop contracture, if knee is not straightened and his DKA will need to be revised AKA, immobilization was recommended.  Review of Systems  Unable to perform ROS: Mental acuity    VITAL SIGNS: Blood pressure 118/69, pulse (!) 53, temperature 98.2 F (36.8 C), resp. rate 18, height 5\' 7"  (1.702 m), weight 77.9 kg (171 lb 12.8 oz), SpO2 100 %.  PHYSICAL EXAMINATION:   GENERAL:  82 y.o.-year-old patient lying in the bed with no acute distress. Sleepy , but arousable with verbal stimulation, opens his eyes and converses, requests pain medications EYES: Pupils equal, round, reactive to light and accommodation. No scleral icterus. Extraocular muscles intact.  HEENT:  Head atraumatic, normocephalic. Oropharynx and nasopharynx clear.  NECK:  Supple, no jugular venous distention. No thyroid enlargement, no tenderness.  LUNGS: Diminished breath sounds bilaterally, no wheezing, rales,rhonchi or crepitation. No use of accessory muscles of respiration. Poor inspiratory effort , intermittent apnea episodes CARDIOVASCULAR: S1, S2  Irregularly irregular. No murmurs, rubs, or gallops.  ABDOMEN: Soft, nontender, nondistended. Bowel sounds present. No organomegaly or mass.  EXTREMITIES: No pedal edema, cyanosis, or clubbing. Left BKA, in immobilizer  NEUROLOGIC: Cranial nerves II through XII are grossly intact. Muscle strength difficult examine since patient is in significant pain is somewhat somnolent. Sensation not able to assess . Gait not checked.  PSYCHIATRIC: The patient is somewhat somnolent, however, opens his eyes, converses briefly, answers questions, follows commands SKIN: No obvious rash, lesion, or ulcer.   ORDERS/RESULTS REVIEWED:   CBC  Recent Labs Lab 01/08/17 1001 01/09/17 0516 01/12/17 0624 01/13/17 0508 01/14/17 0419  WBC 14.1* 13.8* 13.5* 15.4* 15.3*  HGB 12.0* 11.7* 13.2 12.4* 12.6*  HCT 35.6* 34.8* 40.1 37.8* 37.2*  PLT 354 304 333 332 322  MCV 83.2 83.4 84.2 83.0 83.1  MCH 28.1 28.0 27.7 27.3 28.2  MCHC 33.7 33.5 32.9 32.9 33.9  RDW 16.9* 16.8* 16.8* 16.8* 16.8*  LYMPHSABS 1.1  --   --   --   --   MONOABS 0.8  --   --   --   --   EOSABS 0.2  --   --   --   --   BASOSABS 0.1  --   --   --   --    ------------------------------------------------------------------------------------------------------------------  Chemistries  Recent Labs Lab 01/08/17 1001 01/09/17 0516 01/09/17 1604 01/10/17 0432 01/12/17 0624 01/13/17 0508 01/14/17 0419  NA 139 136  --  138 135 133* 135  K 4.1 3.2* 3.9 4.2 5.0 4.5  --   CL 104 103  --  101 101 98*  --   CO2 25 28  --  27 28 28   --   GLUCOSE 149* 127*  --  130* 133* 154*  --   BUN 19 15   --  16 21* 19  --   CREATININE 0.84 0.85  --  0.90 0.98 1.03  --   CALCIUM 8.5* 8.2*  --  8.6* 8.7* 8.4*  --   MG  --  1.7  --   --   --   --   --   AST 22  --   --   --   --   --   --   ALT 19  --   --   --   --   --   --   ALKPHOS 162*  --   --   --   --   --   --   BILITOT 1.0  --   --   --   --   --   --    ------------------------------------------------------------------------------------------------------------------ estimated creatinine clearance is 54.4 mL/min (by C-G formula based on SCr of 1.03 mg/dL). ------------------------------------------------------------------------------------------------------------------ No results for input(s): TSH, T4TOTAL, T3FREE, THYROIDAB in the last 72 hours.  Invalid input(s): FREET3  Cardiac Enzymes No results for input(s): CKMB, TROPONINI, MYOGLOBIN in the last 168 hours.  Invalid input(s): CK ------------------------------------------------------------------------------------------------------------------ Invalid input(s): POCBNP ---------------------------------------------------------------------------------------------------------------  RADIOLOGY: No results found.  EKG:  Orders placed or performed during the hospital encounter of 10/12/16  . EKG 12-Lead  . EKG 12-Lead    ASSESSMENT AND PLAN:  Active Problems:   Gangrene ([place]) #1. 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 Lucky Cowboy, . Of antibiotic therapy per surgery recommendations, supportive therapy, pain medications, PT, Dr. dew recommends to straighten knee to avoid contractions. Continue pain medications, advancing doses, watching patient's mental status closely #2. Chronic A. Fib, RVR, continue patient on Cardizem,  Toprol-XL, heart rate has improved to 55--115 on pain medications, , intermittently at 145, likely due to pain, continue metoprolol, Cardizem intravenously as needed, following closely, appreciate cardiology's input, advancing pain  medications #3. Acute  diastolic CHF, patient is net negative of about 6.7 L since admission , holding diuretic since patient's oral intake is not satisfactory, continue oxygen therapy, chest x-ray revealed improvement of CHF,  weaning off oxygen as tolerated, now on 2 L of oxygen nasal cannula, O2 sats 99-100%  #4hypokalemia, supplemented orally, magnesium level was found to be a relatively low at 1.7, supplemented orally as well, levels are normal , follow with diuresis #5. Leukocytosis,  stable, likely due to amputation , stress/pain #6. Lactic acidosis, due to sepsis, resolved #7 Sepsis due to left foot second toe gangrene, now off antibiotic therapy , as per vascular surgery, blood cultures are negative so far #8 nausea, supportive therapy, Protonix, follow clinically #9. Acute toxic-metabolic encephalopathy, not due to CO2 retention, likely due to opiates intravenously, judicial dosing of opiates for pain is recommended. Improved clinically today, watch carefully with escalating doses of pain medications  Management plans discussed with the patient, numerous family members who are in the room today and they are in agreement.   DRUG ALLERGIES:  Allergies  Allergen Reactions  .  Contrast Media [Iodinated Diagnostic Agents] Shortness Of Breath    SOB  . Iohexol Shortness Of Breath     Desc: Respiratory Distress, Laryngedema, diaphoresis, Onset Date: [id number]   . Latex Rash    CODE STATUS:     Code Status Orders        Start     Ordered   01/08/17 1342  Full code  Continuous     01/08/17 1342    Code Status History    Date Active Date Inactive Code Status Order ID Comments User Context   12/29/2016 10:32 PM 12/30/2016  4:34 PM Full Code [id number]  Lance Coon, MD ED   12/20/2016 12:51 PM 12/20/2016  6:42 PM Full Code [id number]  Algernon Huxley, MD Inpatient   10/22/2016 12:40 AM 10/24/2016  8:58 PM Full Code [id number]  Lance Coon, MD Inpatient   10/14/2016 12:01 PM 10/14/2016  7:25 PM  Full Code [id number]  Algernon Huxley, MD Inpatient   08/30/2016  1:38 PM 09/06/2016  7:41 PM Full Code [id number]  Epifanio Lesches, MD Inpatient   05/19/2016  4:54 AM 05/22/2016  7:07 PM Full Code [id number]  Saundra Shelling, MD Inpatient      TOTAL TIME TAKING CARE OF THIS PATIENT:  35 minutes.  . Discussed with Patient's wife [name] M.D on 01/14/2017 at 1:22 PM  Between 7am to 6pm - Pager - [phone]  After 6pm go to [email or web address] - password [email or web address] [facility]  Office  [phone]  CC: Primary care physician; Alanson Aly, [place]

## 2017-01-14 NOTE — Evaluation (Signed)
Physical Therapy Evaluation Patient Details Name: [name] MRN: [id number] DOB: 1935/04/15 Today's Date: 01/14/2017   History of Present Illness  Pt is a 82 yo M, admitted to acute care on 8/18 with gangrene, which led to L BKA on 8/22. Pt is currently post-op day 2. PMH: arthritis, A-fib, CHF, carcinoma of prostate, CKD, diabetes mellitus, ED, frequent urination, HLD, HTN, moderate mitral insufficiency, PVD, pna, and sleep apnea.  Clinical Impression  Pt is highly confused and in tremendous amounts of pain with touching/moving L LE. Prior to admission, pt was ModI with no confusion, per pt spouse. Pt unable to perform bed mobility, tranfers, and ambulation due to confusion and pain. SPT, repositioned pt into supine position, with support from pillows to L side, with the help of second PT, nurse and aid; pt was total assist. Pt initially in 75 deg L knee flex in resting; knee immobilizer loosely applied to promote greater L knee ext. Difficult to assess extent of pt deficits, based on cognition and pain. Currently, pt lacks ROM to L knee, is developing L hip flex contractures and contracture into L side bending of cervical spine. Pt lacks strength, ability to follow commands, and is in pain. Pt would benefit from skilled PT to address the previously mentioned impairments and promote return to PLOF. Currently recommending SNF, pending d/c.      Follow Up Recommendations SNF    Equipment Recommendations  None recommended by PT    Recommendations for Other Services       Precautions / Restrictions Precautions Precautions: Fall Required Braces or Orthoses: Knee Immobilizer - Left Knee Immobilizer - Left: Other (comment) (Prior to and after PT, to keep L knee extended) Restrictions Weight Bearing Restrictions: No      Mobility  Bed Mobility               General bed mobility comments: Deferred due to high pain levels, impaired cognition, and safety concerns.   Transfers                  General transfer comment: Deferred due to high pain levels, impaired cognition, and safety concerns.   Ambulation/Gait             General Gait Details: Deferred due to high pain levels, impaired cognition, and safety concerns.   Stairs            Wheelchair Mobility    Modified Rankin (Stroke Patients Only)       Balance                                             Pertinent Vitals/Pain Pain Assessment: Faces Faces Pain Scale: Hurts worst Pain Location: L LE surgical site, though grimaces adn hollars in pain with touch to entire L LE.  Pain Descriptors / Indicators: Constant;Discomfort;Grimacing;Moaning;Operative site guarding;Guarding Pain Intervention(s): Limited activity within patient's tolerance;Monitored during session;Premedicated before session;Utilized relaxation techniques    Home Living Family/patient expects to be discharged to:: Private residence Living Arrangements: Spouse/significant other Available Help at Discharge: Family;Available 24 hours/day Type of Home: House Home Access: Stairs to enter Entrance Stairs-Rails:  (Pt poor historian; not clear) Entrance Stairs-Number of Steps: 2 Home Layout: One level        Prior Function Level of Independence: Independent with assistive device(s)         Comments: Pt is poor  historian, due to confusion. However, pt spouse was present during attempts to eval yesterday (8/23) and reported pt with no confusion and ModI, using RW for amb, prior to admission.      Hand Dominance        Extremity/Trunk Assessment   Upper Extremity Assessment Upper Extremity Assessment: Difficult to assess due to impaired cognition    Lower Extremity Assessment Lower Extremity Assessment: Difficult to assess due to impaired cognition       Communication   Communication: Other (comment) (Highly confused, occassionally speaks out in pain)  Cognition Arousal/Alertness:  Lethargic Behavior During Therapy: Anxious Overall Cognitive Status: Impaired/Different from baseline Area of Impairment: Orientation;Attention;Memory;Following commands;Safety/judgement;Awareness                       Following Commands: Follows one step commands inconsistently Safety/Judgement: Decreased awareness of safety;Decreased awareness of deficits     General Comments: Pt lethargic; opened eyes with touch of shouder and sternal nudge. Upon passively moving L LE for reporsitioning, pt screamed out in pain.       General Comments General comments (skin integrity, edema, etc.): Unable to assess balance, due to high pain levels and impaired cognition.    Exercises Total Joint Exercises Goniometric ROM: L knee flex resting position in 75 deg flex.  Other Exercises Other Exercises: Pt in position of comfort with L hip in flex, adb, and ER; while L knee is in flex. Bed mobility, repositioning, and application of knee immobilizer performed with assist of second therapist. Nurse and aid also present. Pt requires total assist to roll to R, and hollars in pain. SPT positioned pillows beneath pt's entire L side, to prevent contractures of the neck, hip, and knee. Pt positioned in supine with L hip in slight abd, and L knee immobilizer in place. This was extremely painful for the pt; knee immobilizer only applied loosely; advised to tighten straps for acquiring grester knee ext, as pt tolerates. SPT provided manual soft tissue mobilization to the quads and hamstrings to the L knee to allow for relaxation as second PT and nurse applied immobilizer; this allowed the pt to relax L hip/knee enough to apply immobilizer. Pt HR at 66 bpm and O2 sat 96%, on 2L supplemental O2.   Assessment/Plan    PT Assessment Patient needs continued PT services  PT Problem List Decreased strength;Decreased range of motion;Decreased activity tolerance;Decreased balance;Decreased mobility;Decreased  coordination;Decreased cognition;Decreased safety awareness;Pain       PT Treatment Interventions DME instruction;Gait training;Functional mobility training;Therapeutic activities;Stair training;Therapeutic exercise;Balance training;Neuromuscular re-education;Cognitive remediation;Patient/family education;Manual techniques    PT Goals (Current goals can be found in the Care Plan section)  Acute Rehab PT Goals Patient Stated Goal: unable to state PT Goal Formulation: With patient Time For Goal Achievement: 01/28/17 Potential to Achieve Goals: Fair    Frequency 7X/week   Barriers to discharge        Co-evaluation               AM-PAC PT "6 Clicks" Daily Activity  Outcome Measure Difficulty turning over in bed (including adjusting bedclothes, sheets and blankets)?: Unable Difficulty moving from lying on back to sitting on the side of the bed? : Unable Difficulty sitting down on and standing up from a chair with arms (e.g., wheelchair, bedside commode, etc,.)?: Unable Help needed moving to and from a bed to chair (including a wheelchair)?: Total Help needed walking in hospital room?: Total Help needed climbing 3-5 steps with a railing? :  Total 6 Click Score: 6    End of Session Equipment Utilized During Treatment: Oxygen       PT Visit Diagnosis: Unsteadiness on feet (R26.81);Other abnormalities of gait and mobility (R26.89);Muscle weakness (generalized) (M62.81);Pain Pain - Right/Left: Left Pain - part of body: Knee    Time: 2924-4628 PT Time Calculation (min) (ACUTE ONLY): 18 min   Charges:         PT G Codes:        Oran Rein PT, SPT  Bevelyn Ngo 01/14/2017, 12:59 PM

## 2017-01-14 NOTE — Progress Notes (Addendum)
Initial Nutrition Assessment  DOCUMENTATION CODES:   Not applicable  INTERVENTION:   Premier Protein BID, each supplement provides 160kcal and 30g protein.   MVI  Bowel regimen as needed  NUTRITION DIAGNOSIS:   Increased nutrient needs related to wound healing as evidenced by increased estimated needs from protein.  GOAL:   Patient will meet greater than or equal to 90% of their needs  MONITOR:   PO intake, Supplement acceptance, Labs, Weight trends, Skin  REASON FOR ASSESSMENT:   Low Braden    ASSESSMENT:   82 year old Caucasian male with medical history significant for cerebrovascular disease, status post recent left foot 3 toe amputation, with who was brought to the hospital with altered mental status shortness of breath and wheezing. Patient complained of significant left foot pain. He was noted to have second toe gangrene and was admitted to the hospital. Now s/p L BKA 8/22   Met with pt and pt's wife in room today. Pt sleeping at time of RD visit so history obtained from pt's wife. [name] reports that pt was a good eater at home up until a few months ago. Wife reports that pt has not really had much of an appetite for the past 2 months but that he has been drinking some protein shakes that she had bought for herself. Per chart, pt with 28lb(14%) wt loss in 4 months; this is significant. Wife reports that a few years ago pt weighed 245lbs. RD discussed the importance of adequate protein intake for wound healing. Pt would benefit from diabetes education prior to discharge; pt is not alert enough today. RD will order supplements to encourage wound healing. Pt with severe muscle wasting in his right leg; wife reports that pt is wheelchair bound at baseline. Pt has not eaten much today r/t pain. Wife will encourage intake of meals and supplements.       Medications reviewed and include: Lovenox, insulin, protonix, warfarin, morphine, oxycodone     Labs reviewed: Cl 98(L),  Ca 8.4(L) Wbc- 15.3(H) cbgs- 133, 154 x 48 hrs A1C 7.4(H)- 8/9  Nutrition-Focused physical exam completed. Findings are no fat depletion, severe muscle depletion in right lower extremity, and mild edema in BLE.   Diet Order:  Diet Carb Modified Fluid consistency: Thin; Room service appropriate? Yes  Skin:  Wound (see comment) (leg incision s/p L BKA)  Last BM:  8/21- constipation   Height:   Ht Readings from Last 1 Encounters:  01/08/17 _0  (1.702 m)    Weight:   Wt Readings from Last 1 Encounters:  01/14/17 171 lb 12.8 oz (77.9 kg)    Ideal Body Weight:  63.3 kg (adjusted for L BKA)  BMI:  Body mass index is 26.91 kg/m.  Estimated Nutritional Needs:   Kcal:  1700-2000kcal/day   Protein:  84-98g/day   Fluid:  >1.7L/day   EDUCATION NEEDS:   Education needs addressed  Koleen Distance MS, RD, LDN Pager #[phone] After Hours Pager: [phone]

## 2017-01-14 NOTE — Progress Notes (Signed)
Patients heat rate has increased above the 140's and fluctuating to the 170's after activity. Patient denies pain. Administered 5mg  of iv metoprolol. Will continue to monitor and assess.

## 2017-01-14 NOTE — Progress Notes (Signed)
ANTICOAGULATION CONSULT NOTE - Initial Consult  Pharmacy Consult for VKA Indication: AF  Allergies  Allergen Reactions  . Contrast Media [Iodinated Diagnostic Agents] Shortness Of Breath    SOB  . Iohexol Shortness Of Breath     Desc: Respiratory Distress, Laryngedema, diaphoresis, Onset Date: [id number]   . Latex Rash    Patient Measurements: Height: 5\' 7"  (170.2 cm) Weight: 168 lb 12.8 oz (76.6 kg) IBW/kg (Calculated) : 66.1 Heparin Dosing Weight:   Vital Signs: Temp: 97.8 F (36.6 C) (08/24 0434) Temp Source: Oral (08/24 0434) BP: 153/74 (08/24 0434) Pulse Rate: 90 (08/24 0434)  Labs:  Recent Labs  01/12/17 0624 01/13/17 0508 01/14/17 0419  HGB 13.2 12.4* 12.6*  HCT 40.1 37.8* 37.2*  PLT 333 332 322  LABPROT 16.9*  --  18.0*  INR 1.36  --  1.47  CREATININE 0.98 1.03  --     Estimated Creatinine Clearance: 54.4 mL/min (by C-G formula based on SCr of 1.03 mg/dL).   Medical History: Past Medical History:  Diagnosis Date  . Arthritis   . Atrial fibrillation ([place])   . Carcinoma of prostate ([place])   . CHF (congestive heart failure) ([place])   . Chronic kidney disease   . Diabetes mellitus without complication ([place])   . ED (erectile dysfunction)   . Frequent urination   . Hyperlipidemia   . Hypertension   . Moderate mitral insufficiency   . Peripheral vascular disease ([place])   . Pneumonia 04/2016  . Prostate cancer ([place])   . Sleep apnea    OSA--USE C-PAP  . Ulcer of left foot due to type 2 diabetes mellitus ([place])   . Urinary stress incontinence, male     Medications:  Infusions:    Assessment: 81 yom POD 1 L BKA - per Dr. Lucky Cowboy okay to resume AC. Pt currently receiving prophylactic doses of enoxapain. Will continue this until INR is therapeutic  Goal of Therapy:  INR 2-3 Monitor platelets by anticoagulation protocol: Yes   Plan:  INR subtherapeutic (held for procedure) - will resume home dosing (tonight= 01/14/17) warfarin 5 mg po once daily and  follow INR daily while IP.   Ramond Dial, Pharm.D., BCPS Clinical Pharmacist 01/14/2017,7:43 AM

## 2017-01-15 LAB — CBC
HCT: 37.8 % — ABNORMAL LOW (ref 40.0–52.0)
Hemoglobin: 12.5 g/dL — ABNORMAL LOW (ref 13.0–18.0)
MCH: 28.2 pg (ref 26.0–34.0)
MCHC: 33.2 g/dL (ref 32.0–36.0)
MCV: 85.1 fL (ref 80.0–100.0)
PLATELETS: 359 10*3/uL (ref 150–440)
RBC: 4.44 MIL/uL (ref 4.40–5.90)
RDW: 16.5 % — ABNORMAL HIGH (ref 11.5–14.5)
WBC: 14.8 10*3/uL — AB (ref 3.8–10.6)

## 2017-01-15 LAB — PHOSPHORUS: Phosphorus: 4 mg/dL (ref 2.5–4.6)

## 2017-01-15 LAB — PROTIME-INR
INR: 1.36
PROTHROMBIN TIME: 16.9 s — AB (ref 11.4–15.2)

## 2017-01-15 LAB — BASIC METABOLIC PANEL
ANION GAP: 10 (ref 5–15)
BUN: 32 mg/dL — ABNORMAL HIGH (ref 6–20)
CALCIUM: 8.8 mg/dL — AB (ref 8.9–10.3)
CHLORIDE: 98 mmol/L — AB (ref 101–111)
CO2: 28 mmol/L (ref 22–32)
Creatinine, Ser: 0.95 mg/dL (ref 0.61–1.24)
GFR calc non Af Amer: >60 mL/min (ref >60)
Glucose, Bld: 139 mg/dL — ABNORMAL HIGH (ref 65–99)
Potassium: 4.6 mmol/L (ref 3.5–5.1)
SODIUM: 136 mmol/L (ref 135–145)

## 2017-01-15 LAB — GLUCOSE, CAPILLARY
GLUCOSE-CAPILLARY: 244 mg/dL — AB (ref 65–99)
GLUCOSE-CAPILLARY: 142 mg/dL — AB (ref 65–99)
Glucose-Capillary: 150 mg/dL — ABNORMAL HIGH (ref 65–99)
Glucose-Capillary: 129 mg/dL — ABNORMAL HIGH (ref 65–99)

## 2017-01-15 LAB — MAGNESIUM: Magnesium: 2.2 mg/dL (ref 1.7–2.4)

## 2017-01-15 MED ORDER — FENTANYL 12 MCG/HR TD PT72: 12.5000 ug | MEDICATED_PATCH | TRANSDERMAL | Status: DC

## 2017-01-15 MED ORDER — MORPHINE SULFATE (PF) 2 MG/ML IV SOLN
2.0000 mg | INTRAVENOUS | Status: DC | PRN
  Administered 2017-01-16 – 2017-01-18 (×3): 2 mg via INTRAVENOUS
  Filled 2017-01-15 (×3): qty 1

## 2017-01-15 MED ORDER — SODIUM CHLORIDE 0.9 % IV SOLN
INTRAVENOUS | Status: DC
  Administered 2017-01-15 – 2017-01-18 (×3): via INTRAVENOUS

## 2017-01-15 MED ORDER — DILTIAZEM HCL 25 MG/5ML IV SOLN
10.0000 mg | Freq: Four times a day (QID) | INTRAVENOUS | Status: DC | PRN
  Administered 2017-01-15: 10 mg via INTRAVENOUS
  Filled 2017-01-15 (×2): qty 5

## 2017-01-15 MED ORDER — OXYCODONE HCL ER 10 MG PO T12A
10.0000 mg | EXTENDED_RELEASE_TABLET | Freq: Two times a day (BID) | ORAL | Status: DC
  Administered 2017-01-15 – 2017-01-19 (×8): 10 mg via ORAL
  Filled 2017-01-15 (×8): qty 1

## 2017-01-15 MED ORDER — DILTIAZEM HCL ER COATED BEADS 180 MG PO CP24
360.0000 mg | ORAL_CAPSULE | Freq: Every day | ORAL | Status: DC
  Administered 2017-01-16 – 2017-01-18 (×3): 360 mg via ORAL
  Filled 2017-01-15 (×3): qty 2

## 2017-01-15 NOTE — Progress Notes (Signed)
Notified Dr. Verdell Carmine about patient urine output of 250 ml in our shift. Patient is still lethargic and have a poor oral intake. Order given to start normal saline at 50 ml/hr. No other concern at the moment. RN will continue to monitor.

## 2017-01-15 NOTE — Plan of Care (Signed)
Problem: Activity: Goal: Risk for activity intolerance will decrease Outcome: Not Progressing Patient is lethargic for most of the shift. Try frequent arousal measures.

## 2017-01-15 NOTE — Progress Notes (Signed)
PT Cancellation Note  Patient Details Name: [name] MRN: [id number] DOB: 12-Feb-1935   Cancelled Treatment:    Reason Eval/Treat Not Completed: Patient not medically ready   Pt with HR 130-160's resting in bed.  Nursing in with pt.  Session held per protocol and will continue as appropriate.   Chesley Noon 01/15/2017, 9:57 AM

## 2017-01-15 NOTE — Progress Notes (Signed)
[place] [place] NAME: [name]    MR#:  [id number]  DATE OF BIRTH:  09/11/1934  SUBJECTIVE:   Patient here due to altered mental status/shortness of breath and wheezing but also noted to have left foot toe gangrene with ischemia. Patient is status post left below the knee amputation. Still having significant pain in contraction to the left lower extremity. Patient's left leg is in a immobilizer. Mental status seems to wax and wane given pain medications. Patient noted to be in atrial fibrillation with rapid ventricular response and requiring pulse doses of IV metoprolol and Cardizem.   REVIEW OF SYSTEMS:    Review of Systems  Constitutional: Negative for chills and fever.  HENT: Negative for congestion and tinnitus.   Eyes: Negative for blurred vision and double vision.  Respiratory: Negative for cough, shortness of breath and wheezing.   Cardiovascular: Negative for chest pain, orthopnea and PND.  Gastrointestinal: Negative for abdominal pain, diarrhea, nausea and vomiting.  Genitourinary: Negative for dysuria and hematuria.  Musculoskeletal: Positive for joint pain (Left lower Ext. ).  Neurological: Negative for dizziness, sensory change and focal weakness.  All other systems reviewed and are negative.   Nutrition: Carb modified Tolerating Diet: Yes Tolerating PT: Await Eval.    DRUG ALLERGIES:   Allergies  Allergen Reactions  . Contrast Media [Iodinated Diagnostic Agents] Shortness Of Breath    SOB  . Iohexol Shortness Of Breath     Desc: Respiratory Distress, Laryngedema, diaphoresis, Onset Date: [id number]   . Latex Rash    VITALS:  Blood pressure 122/77, pulse 98, temperature 98.4 F (36.9 C), resp. rate 20, height 5\' 7"  (1.702 m), weight 77.9 kg (171 lb 12.8 oz), SpO2 99 %.  PHYSICAL EXAMINATION:   Physical Exam  GENERAL:  82 y.o.-year-old patient lying in bed Lethargic but in NAD.   EYES: Pupils equal,  round, reactive to light and accommodation. No scleral icterus. Extraocular muscles intact.  HEENT: Head atraumatic, normocephalic. Oropharynx and nasopharynx clear.  NECK:  Supple, no jugular venous distention. No thyroid enlargement, no tenderness.  LUNGS: Poor Resp. effort, no wheezing, rales, rhonchi. No use of accessory muscles of respiration.  CARDIOVASCULAR: S1, S2 normal. No murmurs, rubs, or gallops.  ABDOMEN: Soft, nontender, nondistended. Bowel sounds present. No organomegaly or mass.  EXTREMITIES: No cyanosis, clubbing or edema b/l.  Left BKA and in Immobilizer.   NEUROLOGIC: Cranial nerves II through XII are intact. No focal Motor or sensory deficits b/l. Globally weak.    PSYCHIATRIC: The patient is alert and oriented x 3.  SKIN: No obvious rash, lesion, or ulcer.    LABORATORY PANEL:   CBC  Recent Labs Lab 01/15/17 0440  WBC 14.8*  HGB 12.5*  HCT 37.8*  PLT 359   ------------------------------------------------------------------------------------------------------------------  Chemistries   Recent Labs Lab 01/15/17 0444  NA 136  K 4.6  CL 98*  CO2 28  GLUCOSE 139*  BUN 32*  CREATININE 0.95  CALCIUM 8.8*  MG 2.2   ------------------------------------------------------------------------------------------------------------------  Cardiac Enzymes No results for input(s): TROPONINI in the last 168 hours. ------------------------------------------------------------------------------------------------------------------  RADIOLOGY:  No results found.   ASSESSMENT AND PLAN:   82 year old male with past medical history of diabetes type 2 without complication, chronic atrial fibrillation, peripheral vascular disease, who presented to the hospital due to shortness of breath, weakness and also noted to have left lower extremity gangrene and ischemia.  1. Left second toe gangrene-this is secondary to patient's diabetes and  bad peripheral vascular  disease. -Seen by vascular surgery and status post left below-the-knee amputation on 01/12/2017. -Patient remains contracted on that left leg and therefore not in a immobilizer. Patient having significant pain in the left lower extremity.  2. Altered mental status/encephalopathy-secondary to increasing doses of narcotics. Patient's mental status waxes and wanes. -I will reduce the patient's IV morphine, and some oral OxyContin and continue current oxycodone dose. Follow mental status.  3. Chronic atrial fibrillation-rates are somewhat uncontrolled I suspect this is pain related. -I'll advance patient Cardizem dose, continue metoprolol, Digoxin.  Will also cont. IV Metoprolol, Cardizem as needed.  -Continue warfarin.  4.  Diabetes type 2 with PVD-to Lantus, sliding scale insulin. Blood sugar stable.  5. GERD-continue Protonix.  6. Hyperlipidemia-continue his atorvastatin.    All the records are reviewed and case discussed with Care Management/Social Worker. Management plans discussed with the patient, family and they are in agreement.  CODE STATUS: Full  DVT Prophylaxis: Lovenox/Coumadin  TOTAL TIME TAKING CARE OF THIS PATIENT: 30 minutes.   POSSIBLE D/C IN 2-3 DAYS, DEPENDING ON CLINICAL CONDITION.   Henreitta Leber M.D on 01/15/2017 at 2:41 PM  Between 7am to 6pm - Pager - [phone]  After 6pm go to [email or web address] - Technical brewer [facility]  Office  [phone]  CC: Primary care physician; Alanson Aly, [place]

## 2017-01-15 NOTE — Progress Notes (Signed)
ANTICOAGULATION CONSULT NOTE - Initial Consult  Pharmacy Consult for VKA Indication: AF  Allergies  Allergen Reactions  . Contrast Media [Iodinated Diagnostic Agents] Shortness Of Breath    SOB  . Iohexol Shortness Of Breath     Desc: Respiratory Distress, Laryngedema, diaphoresis, Onset Date: [id number]   . Latex Rash    Patient Measurements: Height: 5\' 7"  (170.2 cm) Weight: 171 lb 12.8 oz (77.9 kg) (bed weight) IBW/kg (Calculated) : 66.1 Heparin Dosing Weight:   Vital Signs: Temp: 97.8 F (36.6 C) (08/25 0924) Temp Source: Oral (08/25 0924) BP: 124/88 (08/25 0924) Pulse Rate: 80 (08/25 0924)  Labs:  Recent Labs  01/13/17 0508 01/14/17 0419 01/15/17 0440  HGB 12.4* 12.6* 12.5*  HCT 37.8* 37.2* 37.8*  PLT 332 322 359  LABPROT  --  18.0* 16.9*  INR  --  1.47 1.36  CREATININE 1.03  --   --     Estimated Creatinine Clearance: 54.4 mL/min (by C-G formula based on SCr of 1.03 mg/dL).   Medical History: Past Medical History:  Diagnosis Date  . Arthritis   . Atrial fibrillation ([place])   . Carcinoma of prostate ([place])   . CHF (congestive heart failure) ([place])   . Chronic kidney disease   . Diabetes mellitus without complication ([place])   . ED (erectile dysfunction)   . Frequent urination   . Hyperlipidemia   . Hypertension   . Moderate mitral insufficiency   . Peripheral vascular disease ([place])   . Pneumonia 04/2016  . Prostate cancer ([place])   . Sleep apnea    OSA--USE C-PAP  . Ulcer of left foot due to type 2 diabetes mellitus ([place])   . Urinary stress incontinence, male     Medications:  Infusions:    Assessment: 81 yom POD 2 L BKA - per MDDew okay to resume AC. Pt currently receiving prophylactic doses of enoxapain. Will continue this until INR is therapeutic  Goal of Therapy:  INR 2-3 Monitor platelets by anticoagulation protocol: Yes   Plan:  Wlll continue home dosing warfarin 5 mg po once daily and follow INR daily while IP.   Larene Beach,  PharmD  Clinical Pharmacist 01/15/2017,9:52 AM

## 2017-01-15 NOTE — Progress Notes (Signed)
3 Days Post-Op   Subjective/Chief Complaint: Remains sleepy but arousable. Per nursing patient had significant pain last night with repositioning.    Objective: Vital signs in last 24 hours: Temp:  [98.2 F (36.8 C)-98.6 F (37 C)] 98.6 F (37 C) (08/25 0439) Pulse Rate:  [44-168] 115 (08/25 0439) Resp:  [16-18] 16 (08/25 0439) BP: (110-143)/(59-97) 134/84 (08/25 0439) SpO2:  [99 %-100 %] 99 % (08/25 0439) Weight:  [77.9 kg (171 lb 12.8 oz)] 77.9 kg (171 lb 12.8 oz) (08/24 1140) Last BM Date: 01/11/17  Intake/Output from previous day: 08/24 0701 - 08/25 0700 In: -  Out: 450 [Urine:450] Intake/Output this shift: No intake/output data recorded.  General appearance: lethargic but arousable Cardio: irregularly irregular rhythm Left leg: contracted at knee, dressing C/D/I, knee immobolizer in place  Lab Results:   Recent Labs  01/14/17 0419 01/15/17 0440  WBC 15.3* 14.8*  HGB 12.6* 12.5*  HCT 37.2* 37.8*  PLT 322 359   BMET  Recent Labs  01/13/17 0508 01/14/17 0419  NA 133* 135  K 4.5  --   CL 98*  --   CO2 28  --   GLUCOSE 154*  --   BUN 19  --   CREATININE 1.03  --   CALCIUM 8.4*  --    PT/INR  Recent Labs  01/14/17 0419 01/15/17 0440  LABPROT 18.0* 16.9*  INR 1.47 1.36   ABG  Recent Labs  01/13/17 1040  PHART 7.54*  HCO3 28.2*    Studies/Results: No results found.  Anti-infectives: Anti-infectives    Start     Dose/Rate Route Frequency Ordered Stop   01/11/17 2235  cefUROXime (ZINACEF) 1.5 g in dextrose 5 % 50 mL IVPB  Status:  Discontinued     1.5 g 100 mL/hr over 30 Minutes Intravenous 30 min pre-op 01/11/17 2236 01/12/17 1025   01/10/17 0830  cefUROXime (ZINACEF) 1.5 g in dextrose 5 % 50 mL IVPB     1.5 g 100 mL/hr over 30 Minutes Intravenous 30 min pre-op 01/10/17 0803 01/12/17 0800   01/08/17 2200  vancomycin (VANCOCIN) IVPB 1000 mg/200 mL premix  Status:  Discontinued     1,000 mg 200 mL/hr over 60 Minutes Intravenous Every  12 hours 01/08/17 1640 01/13/17 1049   01/08/17 1500  vancomycin (VANCOCIN) IVPB 1000 mg/200 mL premix     1,000 mg 200 mL/hr over 60 Minutes Intravenous  Once 01/08/17 1358 01/08/17 1642   01/08/17 1430  cefTAZidime (FORTAZ) 1 g in dextrose 5 % 50 mL IVPB  Status:  Discontinued     1 g 100 mL/hr over 30 Minutes Intravenous Every 8 hours 01/08/17 1411 01/13/17 1049      Assessment/Plan: s/p Procedure(s): AMPUTATION BELOW KNEE (Left) Keep knee immobilizer to prevent continued contracture  Will change dressing tomorrow. Check electrolytes Monitor mental status Limit narcotic use as tolerated  LOS: 7 days    Jamesetta So A 01/15/2017

## 2017-01-15 NOTE — Progress Notes (Signed)
Notified Dr. Lorenso Courier about patient being lethargic, arousable to voice and pain but drift back to sleep during conversation, order to check electrolytes level. Also received 4mg  of morphine last night. Also talked about his heart rate sustaining to 120's-130's will give oral cardizem and metoprolol and will treat IV if needed. No other concern at the moment. RN will continue to monitor.

## 2017-01-15 NOTE — Plan of Care (Signed)
Problem: Safety: Goal: Ability to remain free from injury will improve Outcome: Progressing  Patient's VSS. Patient in pain whenever we moved and turned him, gave him oxycodone as scheduled. Patient had low urine output this shift started IV fluids. Patient remained lethargic and drift back to sleep during conversation this shift, MD aware. No other concern at the moment. RN will continue to monitor.

## 2017-01-15 NOTE — Progress Notes (Signed)
Talked to Dr. Verdell Carmine about patient's HR sustaining to 130's-150's this morning, BP stable, just received oral Metoprolol, Cardizem and Digoxin, order to give 10mg  of IV Cardizem now, MD will also talk to cardiology if need to start on drip. Also talked about him being lethargic, will change pain medication, will add fentanyl patch and will encourage use of oxycodone. No other concern at the moment. RN will continue to monitor.

## 2017-01-16 LAB — PROTIME-INR
INR: 1.54
PROTHROMBIN TIME: 18.6 s — AB (ref 11.4–15.2)

## 2017-01-16 LAB — GLUCOSE, CAPILLARY
GLUCOSE-CAPILLARY: 189 mg/dL — AB (ref 65–99)
GLUCOSE-CAPILLARY: 144 mg/dL — AB (ref 65–99)
Glucose-Capillary: 143 mg/dL — ABNORMAL HIGH (ref 65–99)
Glucose-Capillary: 140 mg/dL — ABNORMAL HIGH (ref 65–99)

## 2017-01-16 NOTE — Progress Notes (Signed)
Physical Therapy Treatment Patient Details Name: [name] MRN: [id number] DOB: April 16, 1935 Today's Date: 01/16/2017    History of Present Illness Pt is a 82 yo M, admitted to acute care on 8/18 with gangrene, which led to L BKA on 8/22. Pt is currently post-op day 2. PMH: arthritis, A-fib, CHF, carcinoma of prostate, CKD, diabetes mellitus, ED, frequent urination, HLD, HTN, moderate mitral insufficiency, PVD, pna, and sleep apnea.    PT Comments    Chart reviewed and HR monitored.  HR remains elevated.  Varies during rest from 100's up to 130's.  KI was poorly fitting as L knee had relaxed and in a much better position than last session.  Did not measure as pt was in too much pain to touch but appeared to be around 15 degrees extension.  KI was adjusted and straps loosely tightened to prevent skin breakdown but to maintain knee extension.  No redness or areas of breakdown from brace noted when removed.  While he was able to hold LLE up he did not flex his knee actively and further activity was not encouraged due to continued high HR.  Will continue with current goals as appropriate.  Pt complained of being "sore" but declined repositioning in bed.   Follow Up Recommendations   SNF     Equipment Recommendations  None recommended by PT    Recommendations for Other Services       Precautions / Restrictions Precautions Precautions: Fall Required Braces or Orthoses: Knee Immobilizer - Left Knee Immobilizer - Left: Other (comment) Restrictions Weight Bearing Restrictions: No    Mobility  Bed Mobility               General bed mobility comments: deferred due to pain and HR  Transfers                    Ambulation/Gait                 Stairs            Wheelchair Mobility    Modified Rankin (Stroke Patients Only)       Balance                                            Cognition Arousal/Alertness: Lethargic Behavior  During Therapy: Anxious Overall Cognitive Status: Impaired/Different from baseline                                        Exercises Other Exercises Other Exercises: adjusted knee immobilizer for better fit.    General Comments        Pertinent Vitals/Pain Pain Assessment: Faces Faces Pain Scale: Hurts worst Pain Location: L LE surgical site, though grimaces adn hollars in pain with touch to entire L LE.  Pain Intervention(s): Limited activity within patient's tolerance    Home Living                      Prior Function            PT Goals (current goals can now be found in the care plan section) Progress towards PT goals: Not progressing toward goals - comment    Frequency    7X/week      PT  Plan Current plan remains appropriate    Co-evaluation              AM-PAC PT "6 Clicks" Daily Activity  Outcome Measure  Difficulty turning over in bed (including adjusting bedclothes, sheets and blankets)?: Unable Difficulty moving from lying on back to sitting on the side of the bed? : Unable Difficulty sitting down on and standing up from a chair with arms (e.g., wheelchair, bedside commode, etc,.)?: Unable Help needed moving to and from a bed to chair (including a wheelchair)?: Total Help needed walking in hospital room?: Total Help needed climbing 3-5 steps with a railing? : Total 6 Click Score: 6    End of Session Equipment Utilized During Treatment: Oxygen   Patient left: in bed;with bed alarm set;with call bell/phone within reach;with family/visitor present   Pain - Right/Left: Left Pain - part of body: Knee     Time: 1610-9604 PT Time Calculation (min) (ACUTE ONLY): 9 min  Charges:  $Therapeutic Activity: 8-22 mins                    G Codes:       Chesley Noon, PTA 01/16/17, 8:15 AM

## 2017-01-16 NOTE — Clinical Social Work Note (Addendum)
CSW met with the patient and his spouse at bedside to provide emotional support and answer any questions regarding discharge planning.    Santiago Bumpers, MSW, Latanya Presser [phone]

## 2017-01-16 NOTE — Progress Notes (Signed)
4 Days Post-Op   Subjective/Chief Complaint: Doing much better today. Awake, Alert.  Notes mild pain in stump.   Objective: Vital signs in last 24 hours: Temp:  [97.6 F (36.4 C)-98.4 F (36.9 C)] 97.6 F (36.4 C) (08/26 0426) Pulse Rate:  [82-98] 91 (08/26 0426) Resp:  [18-20] 18 (08/25 1945) BP: (119-152)/(69-80) 152/71 (08/26 0426) SpO2:  [95 %-100 %] 100 % (08/26 0426) Last BM Date:  (unknown)  Intake/Output from previous day: 08/25 0701 - 08/26 0700 In: 600 [P.O.:200; I.V.:400] Out: 250 [Urine:250] Intake/Output this shift: No intake/output data recorded.  General appearance: alert, cooperative and no distress Extremities: Left BKA stump- incision clean, intact, scant serosang drainage at medial margin, mild ecchymosis and some dusky areas along suture line, no evidence of ischemia, Soft. Appropriately tender  Lab Results:   Recent Labs  01/14/17 0419 01/15/17 0440  WBC 15.3* 14.8*  HGB 12.6* 12.5*  HCT 37.2* 37.8*  PLT 322 359   BMET  Recent Labs  01/14/17 0419 01/15/17 0444  NA 135 136  K  --  4.6  CL  --  98*  CO2  --  28  GLUCOSE  --  139*  BUN  --  32*  CREATININE  --  0.95  CALCIUM  --  8.8*   PT/INR  Recent Labs  01/15/17 0440 01/16/17 0558  LABPROT 16.9* 18.6*  INR 1.36 1.54   ABG No results for input(s): PHART, HCO3 in the last 72 hours.  Invalid input(s): PCO2, PO2  Studies/Results: No results found.  Anti-infectives: Anti-infectives    Start     Dose/Rate Route Frequency Ordered Stop   01/11/17 2235  cefUROXime (ZINACEF) 1.5 g in dextrose 5 % 50 mL IVPB  Status:  Discontinued     1.5 g 100 mL/hr over 30 Minutes Intravenous 30 min pre-op 01/11/17 2236 01/12/17 1025   01/10/17 0830  cefUROXime (ZINACEF) 1.5 g in dextrose 5 % 50 mL IVPB     1.5 g 100 mL/hr over 30 Minutes Intravenous 30 min pre-op 01/10/17 0803 01/12/17 0800   01/08/17 2200  vancomycin (VANCOCIN) IVPB 1000 mg/200 mL premix  Status:  Discontinued     1,000  mg 200 mL/hr over 60 Minutes Intravenous Every 12 hours 01/08/17 1640 01/13/17 1049   01/08/17 1500  vancomycin (VANCOCIN) IVPB 1000 mg/200 mL premix     1,000 mg 200 mL/hr over 60 Minutes Intravenous  Once 01/08/17 1358 01/08/17 1642   01/08/17 1430  cefTAZidime (FORTAZ) 1 g in dextrose 5 % 50 mL IVPB  Status:  Discontinued     1 g 100 mL/hr over 30 Minutes Intravenous Every 8 hours 01/08/17 1411 01/13/17 1049      Assessment/Plan: s/p Procedure(s): AMPUTATION BELOW KNEE (Left) Monitor stump incision closely, continue knee immobilizer and daily dressing changes.  LOS: 8 days    Evaristo Bury 01/16/2017

## 2017-01-16 NOTE — Progress Notes (Signed)
[place] [facility] [place] NAME: Draydon Clairmont    MR#:  [id number]  DATE OF BIRTH:  05/06/1935  SUBJECTIVE:   Patient here due to altered mental status/shortness of breath and wheezing but also noted to have left foot toe gangrene with ischemia. Patient is status post left below the knee amputation. Patient's heart rates have improved since yesterday, mental status also has improved. Eating breakfast this morning a bit more awake.  REVIEW OF SYSTEMS:    Review of Systems  Constitutional: Negative for chills and fever.  HENT: Negative for congestion and tinnitus.   Eyes: Negative for blurred vision and double vision.  Respiratory: Negative for cough, shortness of breath and wheezing.   Cardiovascular: Negative for chest pain, orthopnea and PND.  Gastrointestinal: Negative for abdominal pain, diarrhea, nausea and vomiting.  Genitourinary: Negative for dysuria and hematuria.  Musculoskeletal: Positive for joint pain (Left lower Ext. ).  Neurological: Negative for dizziness, sensory change and focal weakness.  All other systems reviewed and are negative.   Nutrition: Carb modified Tolerating Diet: Yes Tolerating PT: Await Eval.    DRUG ALLERGIES:   Allergies  Allergen Reactions  . Contrast Media [Iodinated Diagnostic Agents] Shortness Of Breath    SOB  . Iohexol Shortness Of Breath     Desc: Respiratory Distress, Laryngedema, diaphoresis, Onset Date: [id number]   . Latex Rash    VITALS:  Blood pressure 130/72, pulse (!) 133, temperature 98 F (36.7 C), resp. rate 18, height 5\' 7"  (1.702 m), weight 77.9 kg (171 lb 12.8 oz), SpO2 92 %.  PHYSICAL EXAMINATION:   Physical Exam  GENERAL:  82 y.o.-year-old patient lying in bed Lethargic but in NAD.   EYES: Pupils equal, round, reactive to light and accommodation. No scleral icterus. Extraocular muscles intact.  HEENT: Head atraumatic, normocephalic. Oropharynx and nasopharynx clear.  NECK:   Supple, no jugular venous distention. No thyroid enlargement, no tenderness.  LUNGS: Poor Resp. effort, no wheezing, rales, rhonchi. No use of accessory muscles of respiration.  CARDIOVASCULAR: S1, S2 Irregular. No murmurs, rubs, or gallops.  ABDOMEN: Soft, nontender, nondistended. Bowel sounds present. No organomegaly or mass.  EXTREMITIES: No cyanosis, clubbing or edema b/l.  Left BKA and in Immobilizer.   NEUROLOGIC: Cranial nerves II through XII are intact. No focal Motor or sensory deficits b/l. Globally weak.    PSYCHIATRIC: The patient is alert and oriented x 3.  SKIN: No obvious rash, lesion, or ulcer.    LABORATORY PANEL:   CBC  Recent Labs Lab 01/15/17 0440  WBC 14.8*  HGB 12.5*  HCT 37.8*  PLT 359   ------------------------------------------------------------------------------------------------------------------  Chemistries   Recent Labs Lab 01/15/17 0444  NA 136  K 4.6  CL 98*  CO2 28  GLUCOSE 139*  BUN 32*  CREATININE 0.95  CALCIUM 8.8*  MG 2.2   ------------------------------------------------------------------------------------------------------------------  Cardiac Enzymes No results for input(s): TROPONINI in the last 168 hours. ------------------------------------------------------------------------------------------------------------------  RADIOLOGY:  No results found.   ASSESSMENT AND PLAN:   82 year old male with past medical history of diabetes type 2 without complication, chronic atrial fibrillation, peripheral vascular disease, who presented to the hospital due to shortness of breath, weakness and also noted to have left lower extremity gangrene and ischemia.  1. Left second toe gangrene-this is secondary to patient's diabetes and bad peripheral vascular disease. -Seen by vascular surgery and status post left below-the-knee amputation on 01/12/2017. -Patient remains contracted on that left leg and therefore now in a  immobilizer. Pain  has improved.   2. Altered mental status/encephalopathy-secondary to increasing doses of narcotics. Patient's mental status waxes and wanes. - mental status improved as pain meds have been adjusted. Cont. Oxycontin, PRN Oxycodone and lower dose of Morphine. Follow mental status.   3. Chronic atrial fibrillation-rates are labile but somewhat improved since yesterday. - cont. Cardizem, metoprolol, Digoxin.  Will also cont. IV Metoprolol, Cardizem as needed.  -Continue warfarin.  4.  Diabetes type 2 with PVD- cont. Lantus, sliding scale insulin. Blood sugar stable.  5. GERD-continue Protonix.  6. Hyperlipidemia-continue his atorvastatin.  Await PT eval.   All the records are reviewed and case discussed with Care Management/Social Worker. Management plans discussed with the patient, family and they are in agreement.  CODE STATUS: Full  DVT Prophylaxis: Lovenox/Coumadin  TOTAL TIME TAKING CARE OF THIS PATIENT: 73minutes.   POSSIBLE D/C IN 2-3 DAYS, DEPENDING ON CLINICAL CONDITION.   Henreitta Leber M.D on 01/16/2017 at 12:20 PM  Between 7am to 6pm - Pager - [phone]  After 6pm go to [email or web address] - [employer] [facility]  Office  [phone]  CC: Primary care physician; Alanson Aly, [place]

## 2017-01-16 NOTE — Progress Notes (Signed)
ANTICOAGULATION CONSULT NOTE - Initial Consult  Pharmacy Consult for VKA Indication: AF  Allergies  Allergen Reactions  . Contrast Media [Iodinated Diagnostic Agents] Shortness Of Breath    SOB  . Iohexol Shortness Of Breath     Desc: Respiratory Distress, Laryngedema, diaphoresis, Onset Date: [id number]   . Latex Rash    Patient Measurements: Height: 5\' 7"  (170.2 cm) Weight: 171 lb 12.8 oz (77.9 kg) (bed weight) IBW/kg (Calculated) : 66.1 Heparin Dosing Weight:   Vital Signs: Temp: 97.6 F (36.4 C) (08/26 0426) Temp Source: Oral (08/26 0426) BP: 152/71 (08/26 0426) Pulse Rate: 91 (08/26 0426)  Labs:  Recent Labs  01/14/17 0419 01/15/17 0440 01/15/17 0444 01/16/17 0558  HGB 12.6* 12.5*  --   --   HCT 37.2* 37.8*  --   --   PLT 322 359  --   --   LABPROT 18.0* 16.9*  --  18.6*  INR 1.47 1.36  --  1.54  CREATININE  --   --  0.95  --     Estimated Creatinine Clearance: 58.9 mL/min (by C-G formula based on SCr of 0.95 mg/dL).   Medical History: Past Medical History:  Diagnosis Date  . Arthritis   . Atrial fibrillation ([place])   . Carcinoma of prostate ([place])   . CHF (congestive heart failure) ([place])   . Chronic kidney disease   . Diabetes mellitus without complication ([place])   . ED (erectile dysfunction)   . Frequent urination   . Hyperlipidemia   . Hypertension   . Moderate mitral insufficiency   . Peripheral vascular disease ([place])   . Pneumonia 04/2016  . Prostate cancer ([place])   . Sleep apnea    OSA--USE C-PAP  . Ulcer of left foot due to type 2 diabetes mellitus ([place])   . Urinary stress incontinence, male     Medications:  Infusions:  . sodium chloride 50 mL/hr at 01/15/17 1819    Assessment: 81 yom POD 3 L BKA - per MDDew okay to resume AC. Pt currently receiving prophylactic doses of enoxapain. Will continue this until INR is therapeutic  Goal of Therapy:  INR 2-3 Monitor platelets by anticoagulation protocol: Yes   Plan:  Wlll continue  home dosing warfarin 5 mg po once daily and follow INR daily while IP.   Larene Beach, PharmD  Clinical Pharmacist 01/16/2017,7:21 AM

## 2017-01-17 LAB — CBC
HEMATOCRIT: 35.1 % — AB (ref 40.0–52.0)
HEMOGLOBIN: 11.5 g/dL — AB (ref 13.0–18.0)
MCH: 27.4 pg (ref 26.0–34.0)
MCHC: 32.8 g/dL (ref 32.0–36.0)
MCV: 83.4 fL (ref 80.0–100.0)
Platelets: 323 10*3/uL (ref 150–440)
RBC: 4.21 MIL/uL — AB (ref 4.40–5.90)
RDW: 16.5 % — ABNORMAL HIGH (ref 11.5–14.5)
WBC: 10.4 10*3/uL (ref 3.8–10.6)

## 2017-01-17 LAB — BASIC METABOLIC PANEL
ANION GAP: 6 (ref 5–15)
BUN: 33 mg/dL — ABNORMAL HIGH (ref 6–20)
CO2: 29 mmol/L (ref 22–32)
Calcium: 8.3 mg/dL — ABNORMAL LOW (ref 8.9–10.3)
Chloride: 102 mmol/L (ref 101–111)
Creatinine, Ser: 0.54 mg/dL — ABNORMAL LOW (ref 0.61–1.24)
GFR calc Af Amer: >60 mL/min (ref >60)
Glucose, Bld: 135 mg/dL — ABNORMAL HIGH (ref 65–99)
POTASSIUM: 4 mmol/L (ref 3.5–5.1)
SODIUM: 137 mmol/L (ref 135–145)

## 2017-01-17 LAB — GLUCOSE, CAPILLARY
GLUCOSE-CAPILLARY: 112 mg/dL — AB (ref 65–99)
GLUCOSE-CAPILLARY: 148 mg/dL — AB (ref 65–99)
GLUCOSE-CAPILLARY: 164 mg/dL — AB (ref 65–99)
Glucose-Capillary: 140 mg/dL — ABNORMAL HIGH (ref 65–99)

## 2017-01-17 LAB — PROTIME-INR
INR: 1.72
Prothrombin Time: 20.4 seconds — ABNORMAL HIGH (ref 11.4–15.2)

## 2017-01-17 MED ORDER — POLYETHYLENE GLYCOL 3350 17 G PO PACK
17.0000 g | PACK | Freq: Every day | ORAL | Status: DC
  Administered 2017-01-19: 17 g via ORAL
  Filled 2017-01-17 (×2): qty 1

## 2017-01-17 NOTE — Progress Notes (Signed)
[facility] NAME: [name]    MR#:  [id number]  DATE OF BIRTH:  [birth year]  SUBJECTIVE:   Patient here due to altered mental status/shortness of breath and wheezing but also noted to have left foot toe gangrene with ischemia. Patient is status post left below the knee amputation. Mental status much improved.  HR stable.   REVIEW OF SYSTEMS:    Review of Systems  Constitutional: Negative for chills and fever.  HENT: Negative for congestion and tinnitus.   Eyes: Negative for blurred vision and double vision.  Respiratory: Negative for cough, shortness of breath and wheezing.   Cardiovascular: Negative for chest pain, orthopnea and PND.  Gastrointestinal: Negative for abdominal pain, diarrhea, nausea and vomiting.  Genitourinary: Negative for dysuria and hematuria.  Musculoskeletal: Positive for joint pain (Left lower Ext. ).  Neurological: Negative for dizziness, sensory change and focal weakness.  All other systems reviewed and are negative.   Nutrition: Carb modified Tolerating Diet: Yes Tolerating PT: Eval noted.   DRUG ALLERGIES:   Allergies  Allergen Reactions  . Contrast Media [Iodinated Diagnostic Agents] Shortness Of Breath    SOB  . Iohexol Shortness Of Breath     Desc: Respiratory Distress, Laryngedema, diaphoresis, Onset Date: [id number]   . Latex Rash    VITALS:  Blood pressure 138/66, pulse 84, temperature 97.8 F (36.6 C), resp. rate 17, height 5\' 7"  (1.702 m), weight 77.9 kg (171 lb 12.8 oz), SpO2 98 %.  PHYSICAL EXAMINATION:   Physical Exam  GENERAL:  82 year old patient lying in bed in NAD.   EYES: Pupils equal, round, reactive to light and accommodation. No scleral icterus. Extraocular muscles intact.  HEENT: Head atraumatic, normocephalic. Oropharynx and nasopharynx clear.  NECK:  Supple, no jugular venous distention. No thyroid enlargement, no tenderness.  LUNGS: Poor Resp. effort, no  wheezing, rales, rhonchi. No use of accessory muscles of respiration.  CARDIOVASCULAR: S1, S2 Irregular. No murmurs, rubs, or gallops.  ABDOMEN: Soft, nontender, nondistended. Bowel sounds present. No organomegaly or mass.  EXTREMITIES: No cyanosis, clubbing or edema b/l.  Left BKA and in Immobilizer.   NEUROLOGIC: Cranial nerves II through XII are intact. No focal Motor or sensory deficits b/l. Globally weak.    PSYCHIATRIC: The patient is alert and oriented x 3.  SKIN: No obvious rash, lesion, or ulcer.    LABORATORY PANEL:   CBC  Recent Labs Lab 01/17/17 0541  WBC 10.4  HGB 11.5*  HCT 35.1*  PLT 323   ------------------------------------------------------------------------------------------------------------------  Chemistries   Recent Labs Lab 01/15/17 0444 01/17/17 0541  NA 136 137  K 4.6 4.0  CL 98* 102  CO2 28 29  GLUCOSE 139* 135*  BUN 32* 33*  CREATININE 0.95 0.54*  CALCIUM 8.8* 8.3*  MG 2.2  --    ------------------------------------------------------------------------------------------------------------------  Cardiac Enzymes No results for input(s): TROPONINI in the last 168 hours. ------------------------------------------------------------------------------------------------------------------  RADIOLOGY:  No results found.   ASSESSMENT AND PLAN:   82 year old male with past medical history of diabetes type 2 without complication, chronic atrial fibrillation, peripheral vascular disease, who presented to the hospital due to shortness of breath, weakness and also noted to have left lower extremity gangrene and ischemia.  1. Left second toe gangrene-this is secondary to patient's diabetes and bad peripheral vascular disease. -Seen by vascular surgery and status post left below-the-knee amputation on 01/12/2017. -Patient remains contracted on that left leg and therefore now in a immobilizer.  -  patient's lower extremity pain is significantly improved  on current pain regimen.  2. Altered mental status/encephalopathy-secondary to increasing doses of narcotics. Patient's mental status waxes and wanes but much improved in the past 48 hours. - Cont. Oxycontin, PRN Oxycodone. Follow mental status.   3. Chronic atrial fibrillation-rates are labile but much improved in the past 24 hrs.   - cont. Cardizem, metoprolol, Digoxin. Cont. PRN Metoprolol, Cardizem.  -Continue warfarin and INR 1.7 today.   4.  Diabetes type 2 with PVD- cont. Lantus, sliding scale insulin. Blood sugar stable.  5. GERD-continue Protonix.  6. Hyperlipidemia-continue his atorvastatin.  Appreciate PT eval and pt. Will need SNF/STR upon discharge.   All the records are reviewed and case discussed with Care Management/Social Worker. Management plans discussed with the patient, family and they are in agreement.  CODE STATUS: Full  DVT Prophylaxis: Lovenox/Coumadin  TOTAL TIME TAKING CARE OF THIS PATIENT: 25 minutes.   POSSIBLE D/C IN 2-3 DAYS, DEPENDING ON CLINICAL CONDITION.   Henreitta Leber M.D on 01/17/2017 at 4:11 PM  Between 7am to 6pm - Pager - [phone]  After 6pm go to [email or web address] - [employer] [facility]  Office  [phone]  CC: Primary care physician; Alanson Aly, [place]

## 2017-01-17 NOTE — Progress Notes (Signed)
PT Cancellation Note  Patient Details Name: [name] MRN: [id number] DOB: 1934-09-09   Cancelled Treatment:    Reason Eval/Treat Not Completed: Other (comment). Treatment attempted; pt with nursing staff. Re attempt at a later time/date, as the schedule allows.    Larae Grooms, PTA 01/17/2017, 12:45 PM

## 2017-01-17 NOTE — Progress Notes (Signed)
[facility]  Daily Progress Note   Subjective  - 5 Days Post-Op  Sleeping.  Arouses with stimulation but falls back asleep HR much better  Objective Vitals:   01/16/17 2014 01/17/17 0258 01/17/17 1348 01/17/17 1401  BP: 115/68 106/69 138/66   Pulse: 84 88 82 84  Resp:   17   Temp: 98.3 F (36.8 C) 97.9 F (36.6 C) 97.8 F (36.6 C)   TempSrc: Oral Oral    SpO2: 100% 96% 98% 98%  Weight:      Height:        Intake/Output Summary (Last 24 hours) at 01/17/17 1657 Last data filed at 01/17/17 1555  Gross per 24 hour  Intake          1285.83 ml  Output              750 ml  Net           535.83 ml    PULM  CTAB CV  RRR VASC  Left stump intact.  Knee straight in an immobilizer  Laboratory CBC    Component Value Date/Time   WBC 10.4 01/17/2017 0541   HGB 11.5 (L) 01/17/2017 0541   HGB 16.3 01/20/2013 0255   HCT 35.1 (L) 01/17/2017 0541   HCT 47.9 01/20/2013 0255   PLT 323 01/17/2017 0541   PLT 245 01/20/2013 0255    BMET    Component Value Date/Time   NA 137 01/17/2017 0541   NA 140 01/20/2013 0255   K 4.0 01/17/2017 0541   K 3.5 01/20/2013 0255   CL 102 01/17/2017 0541   CL 103 01/20/2013 0255   CO2 29 01/17/2017 0541   CO2 30 01/20/2013 0255   GLUCOSE 135 (H) 01/17/2017 0541   GLUCOSE 145 (H) 01/20/2013 0255   BUN 33 (H) 01/17/2017 0541   BUN 22 (H) 01/20/2013 0255   CREATININE 0.54 (L) 01/17/2017 0541   CREATININE 1.09 01/20/2013 0255   CALCIUM 8.3 (L) 01/17/2017 0541   CALCIUM 9.4 01/20/2013 0255   GFRNONAA >60 01/17/2017 0541   GFRNONAA >60 01/20/2013 0255   GFRAA >60 01/17/2017 0541   GFRAA >60 01/20/2013 0255    Assessment/Planning: POD #5 s/p left BKA   Will try to remove immobilizer tomorrow and check wound.  If able to straighten knee without immobilizer, would like to be able to leave it off  Likely will need several weeks of rehab at discharge, possibly in next day or two.    [name]  01/17/2017, 4:57  PM

## 2017-01-17 NOTE — Progress Notes (Signed)
ANTICOAGULATION CONSULT NOTE - Initial Consult  Pharmacy Consult for VKA Indication: AF  Allergies  Allergen Reactions  . Contrast Media [Iodinated Diagnostic Agents] Shortness Of Breath    SOB  . Iohexol Shortness Of Breath     Desc: Respiratory Distress, Laryngedema, diaphoresis, Onset Date: [id number]   . Latex Rash    Patient Measurements: Height: 5\' 7"  (170.2 cm) Weight: 171 lb 12.8 oz (77.9 kg) (bed weight) IBW/kg (Calculated) : 66.1 Heparin Dosing Weight:   Vital Signs: Temp: 97.9 F (36.6 C) (08/27 0258) Temp Source: Oral (08/27 0258) BP: 106/69 (08/27 0258) Pulse Rate: 88 (08/27 0258)  Labs:  Recent Labs  01/15/17 0440 01/15/17 0444 01/16/17 0558 01/17/17 0541  HGB 12.5*  --   --   --   HCT 37.8*  --   --   --   PLT 359  --   --   --   LABPROT 16.9*  --  18.6* 20.4*  INR 1.36  --  1.54 1.72  CREATININE  --  0.95  --   --     Estimated Creatinine Clearance: 58.9 mL/min (by C-G formula based on SCr of 0.95 mg/dL).   Medical History: Past Medical History:  Diagnosis Date  . Arthritis   . Atrial fibrillation ([place])   . Carcinoma of prostate ([place])   . CHF (congestive heart failure) ([place])   . Chronic kidney disease   . Diabetes mellitus without complication ([place])   . ED (erectile dysfunction)   . Frequent urination   . Hyperlipidemia   . Hypertension   . Moderate mitral insufficiency   . Peripheral vascular disease ([place])   . Pneumonia 04/2016  . Prostate cancer ([place])   . Sleep apnea    OSA--USE C-PAP  . Ulcer of left foot due to type 2 diabetes mellitus ([place])   . Urinary stress incontinence, male     Medications:  Infusions:  . sodium chloride 50 mL/hr at 01/16/17 1302    Assessment: 81 yom POD 3 L BKA - per MDDew okay to resume AC. Pt currently receiving prophylactic doses of enoxapain. Will continue this until INR is therapeutic  Goal of Therapy:  INR 2-3 Monitor platelets by anticoagulation protocol: Yes   Plan:  Continue pt home  dose of 5mg  daily. Warfarin resumed 8/25. INR increasing as expected. Recheck INR in the AM  Delmy Holdren D Kato Wieczorek, Pharm.D, BCPS Clinical Pharmacist  01/17/2017,8:24 AM

## 2017-01-17 NOTE — Care Management Important Message (Signed)
Important Message  Patient Details  Name: [name] MRN: [id number] Date of Birth: [birth year]   Medicare Important Message Given:  Yes    Marshell Garfinkel, RN 01/17/2017, 9:01 AM

## 2017-01-17 NOTE — Progress Notes (Signed)
Physical Therapy Treatment Patient Details Name: [name] MRN: [id number] DOB: 25-Sep-1934 Today's Date: 01/17/2017    History of Present Illness Pt is a 82 yo M, admitted to acute care on 8/18 with gangrene, which led to L BKA on 8/22. Pt is currently post-op day 2. PMH: arthritis, A-fib, CHF, carcinoma of prostate, CKD, diabetes mellitus, ED, frequent urination, HLD, HTN, moderate mitral insufficiency, PVD, pna, and sleep apnea.    PT Comments    Pt nods in agreement to PT; lethargic and communicates very little, often demonstrating a blank stare. Pt only responds to pain stimuli with Left lower extremity movement. Supine exercises with some active assisted movement in Right lower extremity, but primarily passive range of motion and Left lower extremity passive range of limited motion with increased intolerable pain. Pt does not answer with offering of attempted sitting edge of bed; therefore attempted. Pt does not participate and is unable to tolerate due to pain. Unable to reach full sit. Pt returned to semi reclined supine with improved comfort. Continue PT to progress tolerance, range and strength to improve functional mobility.    Follow Up Recommendations  SNF     Equipment Recommendations       Recommendations for Other Services       Precautions / Restrictions Precautions Precautions: Fall Required Braces or Orthoses: Knee Immobilizer - Left Knee Immobilizer - Left: On at all times Restrictions Weight Bearing Restrictions: No    Mobility  Bed Mobility Overal bed mobility: Needs Assistance Bed Mobility: Supine to Sit;Sit to Supine     Supine to sit: Total assist Sit to supine: Total assist   General bed mobility comments: Attempted; unable to attain sitting; movement of LEs partially over edge of bed and attempted trunk elevation. Pt does not give any assist and with attempted sitting yells in pain. Returned to semi reclined supine position  Transfers                  General transfer comment: unable   Ambulation/Gait             General Gait Details: unable   Stairs            Wheelchair Mobility    Modified Rankin (Stroke Patients Only)       Balance                                            Cognition Arousal/Alertness: Lethargic Behavior During Therapy: Flat affect (except when in pain; communicates very little) Overall Cognitive Status: Impaired/Different from baseline Area of Impairment: Orientation;Attention;Memory;Following commands;Safety/judgement;Awareness                 Orientation Level: Disoriented to;Place;Time;Situation Current Attention Level: Alternating Memory: Decreased short-term memory;Decreased recall of precautions Following Commands: Follows one step commands inconsistently Safety/Judgement: Decreased awareness of safety;Decreased awareness of deficits     General Comments: Communicates/responds little except to pain      Exercises General Exercises - Lower Extremity Ankle Circles/Pumps: PROM;Right;20 reps;Supine Quad Sets: Strengthening;Both;10 reps;Supine (trace contraction B; increased cues/time) Gluteal Sets: Other (comment) (attempted; unable to perform/follow instructions) Short Arc Quad: PROM;Right;20 reps;Supine Heel Slides: PROM;AAROM;Right;20 reps;Supine Hip ABduction/ADduction: PROM;AAROM;Right;20 reps;Supine (L PROM 5 times; too painful) Straight Leg Raises: PROM;Both;Supine (10 on R; 2 on L)    General Comments        Pertinent Vitals/Pain Pain Assessment: Faces  Faces Pain Scale: Hurts whole lot Pain Location: LLE Pain Descriptors / Indicators: Grimacing;Guarding Pain Intervention(s): Limited activity within patient's tolerance    Home Living                      Prior Function            PT Goals (current goals can now be found in the care plan section) Progress towards PT goals: Not progressing toward goals -  comment    Frequency    7X/week      PT Plan Current plan remains appropriate    Co-evaluation              AM-PAC PT "6 Clicks" Daily Activity  Outcome Measure  Difficulty turning over in bed (including adjusting bedclothes, sheets and blankets)?: Unable Difficulty moving from lying on back to sitting on the side of the bed? : Unable Difficulty sitting down on and standing up from a chair with arms (e.g., wheelchair, bedside commode, etc,.)?: Unable Help needed moving to and from a bed to chair (including a wheelchair)?: Total Help needed walking in hospital room?: Total Help needed climbing 3-5 steps with a railing? : Total 6 Click Score: 6    End of Session Equipment Utilized During Treatment: Oxygen Activity Tolerance: Patient limited by lethargy;Patient limited by pain;Other (comment) (cognition) Patient left: in bed;with call bell/phone within reach;with bed alarm set;Other (comment) (L knee immobilizer)   PT Visit Diagnosis: Unsteadiness on feet (R26.81);Other abnormalities of gait and mobility (R26.89);Muscle weakness (generalized) (M62.81);Pain Pain - Right/Left: Left Pain - part of body: Knee     Time: 8841-6606 PT Time Calculation (min) (ACUTE ONLY): 28 min  Charges:  $Therapeutic Exercise: 8-22 mins $Therapeutic Activity: 8-22 mins                    G Codes:        Larae Grooms, PTA 01/17/2017, 2:40 PM

## 2017-01-18 ENCOUNTER — Encounter (INDEPENDENT_AMBULATORY_CARE_PROVIDER_SITE_OTHER): Payer: Medicare Other | Admitting: Vascular Surgery

## 2017-01-18 DIAGNOSIS — L8990 Pressure ulcer of unspecified site, unspecified stage: Secondary | ICD-10-CM | POA: Insufficient documentation

## 2017-01-18 LAB — BASIC METABOLIC PANEL
Anion gap: 4 — ABNORMAL LOW (ref 5–15)
BUN: 28 mg/dL — AB (ref 6–20)
CALCIUM: 7.8 mg/dL — AB (ref 8.9–10.3)
CHLORIDE: 104 mmol/L (ref 101–111)
CO2: 29 mmol/L (ref 22–32)
CREATININE: 0.46 mg/dL — AB (ref 0.61–1.24)
Glucose, Bld: 115 mg/dL — ABNORMAL HIGH (ref 65–99)
Potassium: 3.9 mmol/L (ref 3.5–5.1)
SODIUM: 137 mmol/L (ref 135–145)

## 2017-01-18 LAB — PROTIME-INR
INR: 1.99
Prothrombin Time: 22.9 seconds — ABNORMAL HIGH (ref 11.4–15.2)

## 2017-01-18 LAB — GLUCOSE, CAPILLARY
GLUCOSE-CAPILLARY: 103 mg/dL — AB (ref 65–99)
GLUCOSE-CAPILLARY: 161 mg/dL — AB (ref 65–99)
GLUCOSE-CAPILLARY: 118 mg/dL — AB (ref 65–99)
Glucose-Capillary: 206 mg/dL — ABNORMAL HIGH (ref 65–99)

## 2017-01-18 MED ORDER — DILTIAZEM HCL ER COATED BEADS 120 MG PO CP24
240.0000 mg | ORAL_CAPSULE | Freq: Every day | ORAL | Status: DC
  Administered 2017-01-19: 240 mg via ORAL
  Filled 2017-01-18: qty 2

## 2017-01-18 NOTE — Progress Notes (Signed)
 Vein and Vascular Surgery  Daily Progress Note   Subjective  - 6 Days Post-Op  Much more awake [name] "almost gone"  Objective Vitals:   01/18/17 0956 01/18/17 1300 01/18/17 1342 01/18/17 1343  BP: (!) 145/86  125/70   Pulse: 87 (!) 102 (!) 115 (!) 101  Resp:   13   Temp:   98.3 F (36.8 C)   TempSrc:   Oral   SpO2:   100% 100%  Weight:      Height:        Intake/Output Summary (Last 24 hours) at 01/18/17 1738 Last data filed at 01/18/17 1010  Gross per 24 hour  Intake           724.17 ml  Output                0 ml  Net           724.17 ml    PULM  CTAB CV  Irregular and tachycardic VASC  Dressing C/D/I  Laboratory CBC    Component Value Date/Time   WBC 10.4 01/17/2017 0541   HGB 11.5 (L) 01/17/2017 0541   HGB 16.3 01/20/2013 0255   HCT 35.1 (L) 01/17/2017 0541   HCT 47.9 01/20/2013 0255   PLT 323 01/17/2017 0541   PLT 245 01/20/2013 0255    BMET    Component Value Date/Time   NA 137 01/18/2017 0513   NA 140 01/20/2013 0255   K 3.9 01/18/2017 0513   K 3.5 01/20/2013 0255   CL 104 01/18/2017 0513   CL 103 01/20/2013 0255   CO2 29 01/18/2017 0513   CO2 30 01/20/2013 0255   GLUCOSE 115 (H) 01/18/2017 0513   GLUCOSE 145 (H) 01/20/2013 0255   BUN 28 (H) 01/18/2017 0513   BUN 22 (H) 01/20/2013 0255   CREATININE 0.46 (L) 01/18/2017 0513   CREATININE 1.09 01/20/2013 0255   CALCIUM 7.8 (L) 01/18/2017 0513   CALCIUM 9.4 01/20/2013 0255   GFRNONAA >60 01/18/2017 0513   GFRNONAA >60 01/20/2013 0255   GFRAA >60 01/18/2017 0513   GFRAA >60 01/20/2013 0255    Assessment/Planning: POD #7 s/p left BKA    Ok to come out of knee immobilizer  Ok to go to rehab tomorrow  RTC 2-3 weeks for wound check and staple removal    [name]  01/18/2017, 5:38 PM

## 2017-01-18 NOTE — Progress Notes (Signed)
ANTICOAGULATION CONSULT NOTE - Initial Consult  Pharmacy Consult for VKA Indication: AF  Allergies  Allergen Reactions  . Contrast Media [Iodinated Diagnostic Agents] Shortness Of Breath    SOB  . Iohexol Shortness Of Breath     Desc: Respiratory Distress, Laryngedema, diaphoresis, Onset Date: [id number]   . Latex Rash    Patient Measurements: Height: 5\' 7"  (170.2 cm) Weight: 171 lb 12.8 oz (77.9 kg) (bed weight) IBW/kg (Calculated) : 66.1 Heparin Dosing Weight:   Vital Signs: Temp: 97.6 F (36.4 C) (08/28 0324) BP: 140/65 (08/28 0324) Pulse Rate: 87 (08/28 0324)  Labs:  Recent Labs  01/16/17 0558 01/17/17 0541 01/18/17 0513  HGB  --  11.5*  --   HCT  --  35.1*  --   PLT  --  323  --   LABPROT 18.6* 20.4* 22.9*  INR 1.54 1.72 1.99  CREATININE  --  0.54* 0.46*    Estimated Creatinine Clearance: 70 mL/min (A) (by C-G formula based on SCr of 0.46 mg/dL (L)).   Medical History: Past Medical History:  Diagnosis Date  . Arthritis   . Atrial fibrillation ([place])   . Carcinoma of prostate ([place])   . CHF (congestive heart failure) ([place])   . Chronic kidney disease   . Diabetes mellitus without complication ([place])   . ED (erectile dysfunction)   . Frequent urination   . Hyperlipidemia   . Hypertension   . Moderate mitral insufficiency   . Peripheral vascular disease ([place])   . Pneumonia 04/2016  . Prostate cancer ([place])   . Sleep apnea    OSA--USE C-PAP  . Ulcer of left foot due to type 2 diabetes mellitus ([place])   . Urinary stress incontinence, male     Medications:  Infusions:  . sodium chloride 50 mL/hr at 01/18/17 0503    Assessment: 81 yom POD 3 L BKA - per MDDew okay to resume AC. Pt currently receiving prophylactic doses of enoxapain. Will continue this until INR is therapeutic  Goal of Therapy:  INR 2-3 Monitor platelets by anticoagulation protocol: Yes   Plan:  Continue pt home dose of 5mg  daily. Warfarin resumed 8/25. INR increasing as expected.  Recheck INR in the AM   Melissa D Maccia, Pharm.D, BCPS Clinical Pharmacist   8/28 INR 1.99, trending upwards on warfarin 5mg  daily home dose, will resume. Recheck INR in the AM  Donna Christen Daja Shuping, PharmD, [employer], [facility]      01/18/2017,7:51 AM

## 2017-01-18 NOTE — Plan of Care (Signed)
Problem: Skin Integrity: Goal: Risk for impaired skin integrity will decrease Outcome: Progressing Attempting to turn patient Q2h as often as he allows. Sacral foam in place, pillow support and heel off mattress.

## 2017-01-18 NOTE — Clinical Social Work Note (Addendum)
CSW updated [facility] that patient's wife would still like to have him discharge there once he is medically ready for discharge.  CSW to continue to follow patient's progress throughout discharge planning.  CSW confirmed with patient's wife Everlene Farrier that she still would like patient to go to [facility] once he is ready for discharge.  [name]. [place], MSW, [place]  01/18/2017 5:45 PM

## 2017-01-18 NOTE — Progress Notes (Signed)
[place] [facility] [place] NAME: [name]    MR#:  [id number]  DATE OF BIRTH:  [birth year]  SUBJECTIVE:   Patient here due to altered mental status/shortness of breath and wheezing but also noted to have left foot toe gangrene with ischemia. Patient is status post left below the knee amputation.  Mental status improving and no acute events overnight. HR stable.   REVIEW OF SYSTEMS:    Review of Systems  Constitutional: Negative for chills and fever.  HENT: Negative for congestion and tinnitus.   Eyes: Negative for blurred vision and double vision.  Respiratory: Negative for cough, shortness of breath and wheezing.   Cardiovascular: Negative for chest pain, orthopnea and PND.  Gastrointestinal: Negative for abdominal pain, diarrhea, nausea and vomiting.  Genitourinary: Negative for dysuria and hematuria.  Musculoskeletal: Positive for joint pain (Left lower Ext. ).  Neurological: Negative for dizziness, sensory change and focal weakness.  All other systems reviewed and are negative.   Nutrition: Carb modified Tolerating Diet: Yes Tolerating PT: Eval noted.   DRUG ALLERGIES:   Allergies  Allergen Reactions  . Contrast Media [Iodinated Diagnostic Agents] Shortness Of Breath    SOB  . Iohexol Shortness Of Breath     Desc: Respiratory Distress, Laryngedema, diaphoresis, Onset Date: [id number]   . Latex Rash    VITALS:  Blood pressure 125/70, pulse (!) 101, temperature 98.3 F (36.8 C), temperature source Oral, resp. rate 13, height 5\' 7"  (1.702 m), weight 77.9 kg (171 lb 12.8 oz), SpO2 100 %.  PHYSICAL EXAMINATION:   Physical Exam  GENERAL:  82 y.o.-year-old patient lying in bed in NAD.   EYES: Pupils equal, round, reactive to light and accommodation. No scleral icterus. Extraocular muscles intact.  HEENT: Head atraumatic, normocephalic. Oropharynx and nasopharynx clear.  NECK:  Supple, no jugular venous distention. No thyroid  enlargement, no tenderness.  LUNGS: Poor Resp. effort, no wheezing, rales, rhonchi. No use of accessory muscles of respiration.  CARDIOVASCULAR: S1, S2 Irregular. No murmurs, rubs, or gallops.  ABDOMEN: Soft, nontender, nondistended. Bowel sounds present. No organomegaly or mass.  EXTREMITIES: No cyanosis, clubbing or edema b/l.  Left BKA and in Immobilizer.   NEUROLOGIC: Cranial nerves II through XII are intact. No focal Motor or sensory deficits b/l. Globally weak.    PSYCHIATRIC: The patient is alert and oriented x 2.  SKIN: No obvious rash, lesion, or ulcer.    LABORATORY PANEL:   CBC  Recent Labs Lab 01/17/17 0541  WBC 10.4  HGB 11.5*  HCT 35.1*  PLT 323   ------------------------------------------------------------------------------------------------------------------  Chemistries   Recent Labs Lab 01/15/17 0444  01/18/17 0513  NA 136  < > 137  K 4.6  < > 3.9  CL 98*  < > 104  CO2 28  < > 29  GLUCOSE 139*  < > 115*  BUN 32*  < > 28*  CREATININE 0.95  < > 0.46*  CALCIUM 8.8*  < > 7.8*  MG 2.2  --   --   < > = values in this interval not displayed. ------------------------------------------------------------------------------------------------------------------  Cardiac Enzymes No results for input(s): TROPONINI in the last 168 hours. ------------------------------------------------------------------------------------------------------------------  RADIOLOGY:  No results found.   ASSESSMENT AND PLAN:   82 year old male with past medical history of diabetes type 2 without complication, chronic atrial fibrillation, peripheral vascular disease, who presented to the hospital due to shortness of breath, weakness and also noted to have left lower extremity gangrene and  ischemia.  1. Left second toe gangrene-this is secondary to patient's diabetes and bad peripheral vascular disease. -Seen by vascular surgery and status post left below-the-knee amputation on  01/12/2017. - as per Vascular they will evaluate wound today and try to remove immobilizer if possible today or tomorrow.  - patient's lower extremity pain is significantly improved on current pain regimen.  2. Altered mental status/encephalopathy-secondary to increasing doses of narcotics. Patient's mental status waxes and wanes but much improved over the past 2-3 days. - Cont. Oxycontin, PRN Oxycodone. Avoid IV narcotics and delirogenic meds.   3. Chronic atrial fibrillation-rates are labile but much improved in the past 24-48 hrs.   - cont. Cardizem, metoprolol, Digoxin. Cont. PRN Metoprolol, Cardizem.  -Continue warfarin and INR 1.9 today.   4.  Diabetes type 2 with PVD- cont. Lantus, sliding scale insulin. Blood sugar stable.  5. GERD-continue Protonix.  6. Hyperlipidemia-continue his atorvastatin.  Appreciate PT eval and pt. Will need SNF/STR upon discharge.   All the records are reviewed and case discussed with Care Management/Social Worker. Management plans discussed with the patient, family and they are in agreement.  CODE STATUS: Full  DVT Prophylaxis: Lovenox/Coumadin  TOTAL TIME TAKING CARE OF THIS PATIENT: 30 minutes.   POSSIBLE D/C IN 2-3 DAYS, DEPENDING ON CLINICAL CONDITION.   Henreitta Leber M.D on 01/18/2017 at 3:02 PM  Between 7am to 6pm - Pager - [phone]  After 6pm go to [email or web address] - Technical brewer [facility]  Office  [phone]  CC: Primary care physician; Alanson Aly, [place]

## 2017-01-18 NOTE — Progress Notes (Signed)
New order for dressing change came through while patient was eating dinner and had visitors. Patient requested to wait until scheduled evening dose of pain medication for dressing change. Will inform night shift RN.

## 2017-01-18 NOTE — Progress Notes (Signed)
Physical Therapy Treatment Patient Details Name: [name] MRN: [id number] DOB: [birth year] Today's Date: 01/18/2017    History of Present Illness Pt is a 82 yo M, admitted to acute care on 8/18 with gangrene, which led to L BKA on 8/22. Pt is currently post-op day 2. PMH: arthritis, A-fib, CHF, carcinoma of prostate, CKD, diabetes mellitus, ED, frequent urination, HLD, HTN, moderate mitral insufficiency, PVD, pna, and sleep apnea.    PT Comments    Pt continues lethargic with flat affect today; reluctant to participate with PT. Pt denies pain. Supine exercises initiated and pt grimaces with pain with movement/isometric exercises. Pt requires verbal and tactile cues to remain on task; pt often with blank stare and eyes rolling back in head/falling asleep. After several exercises pt refuses further treatment. Offered sitting edge of bed and pt refuses. Continue PT to progress participation, endurance and strength to improve functional mobility.   Follow Up Recommendations  SNF     Equipment Recommendations  None recommended by PT    Recommendations for Other Services       Precautions / Restrictions Precautions Precautions: Fall Required Braces or Orthoses: Knee Immobilizer - Left Knee Immobilizer - Left: On at all times Restrictions Weight Bearing Restrictions: No    Mobility  Bed Mobility               General bed mobility comments: Not tested; refuses additional therapy after several exercises  Transfers                    Ambulation/Gait                 Stairs            Wheelchair Mobility    Modified Rankin (Stroke Patients Only)       Balance                                            Cognition Arousal/Alertness: Lethargic Behavior During Therapy: Flat affect Overall Cognitive Status: Impaired/Different from baseline Area of Impairment: Orientation;Attention;Memory;Following  commands;Safety/judgement;Awareness                 Orientation Level: Disoriented to;Time;Place;Situation Current Attention Level:  (does not really pay attention to task at hand) Memory: Decreased short-term memory Following Commands: Follows one step commands inconsistently;Follows one step commands with increased time Safety/Judgement: Decreased awareness of safety;Decreased awareness of deficits     General Comments: little response with blank stare and eyes rolling back alternating with short answers      Exercises General Exercises - Lower Extremity Ankle Circles/Pumps: PROM;Right;20 reps;Supine Quad Sets: Strengthening;Both;10 reps;Supine Gluteal Sets: Other (comment) Short Arc Quad: PROM;Right;20 reps;Supine Hip ABduction/ADduction: Other (comment) (attempted on L; refuses PT)    General Comments        Pertinent Vitals/Pain Pain Assessment: Faces Faces Pain Scale: Hurts even more (States no, but grimaces with attempted movement) Pain Location: LLE Pain Descriptors / Indicators: Grimacing Pain Intervention(s): Limited activity within patient's tolerance    Home Living                      Prior Function            PT Goals (current goals can now be found in the care plan section) Progress towards PT goals: Not progressing toward goals - comment  Frequency    7X/week      PT Plan Current plan remains appropriate    Co-evaluation              AM-PAC PT "6 Clicks" Daily Activity  Outcome Measure  Difficulty turning over in bed (including adjusting bedclothes, sheets and blankets)?: Unable Difficulty moving from lying on back to sitting on the side of the bed? : Unable Difficulty sitting down on and standing up from a chair with arms (e.g., wheelchair, bedside commode, etc,.)?: Unable Help needed moving to and from a bed to chair (including a wheelchair)?: Total Help needed walking in hospital room?: Total Help needed climbing  3-5 steps with a railing? : Total 6 Click Score: 6    End of Session Equipment Utilized During Treatment: Oxygen Activity Tolerance: Patient limited by lethargy;Patient limited by fatigue;Patient limited by pain Patient left: in bed;with call bell/phone within reach;with bed alarm set;Other (comment) (L KI)   PT Visit Diagnosis: Unsteadiness on feet (R26.81);Other abnormalities of gait and mobility (R26.89);Muscle weakness (generalized) (M62.81);Pain Pain - Right/Left: Left Pain - part of body: Knee     Time: 7425-9563 PT Time Calculation (min) (ACUTE ONLY): 11 min  Charges:  $Therapeutic Exercise: 8-22 mins                    G Codes:        Larae Grooms, PTA 01/18/2017, 2:01 PM

## 2017-01-18 NOTE — Progress Notes (Signed)
This RN received call from [place] stating that patient's heart rate and dropped to the 40's Afib, but quickly returned to 50-70's. Patient has been mostly Afib 70-90's today after receiving regular morning medications. Updated Dr. Saralyn Pilar. Order to change Cardizem CD to 240 mg daily and continue to monitor.

## 2017-01-19 LAB — PROTIME-INR
INR: 2.45
PROTHROMBIN TIME: 26.4 s — AB (ref 11.4–15.2)

## 2017-01-19 LAB — GLUCOSE, CAPILLARY
GLUCOSE-CAPILLARY: 98 mg/dL (ref 65–99)
GLUCOSE-CAPILLARY: 174 mg/dL — AB (ref 65–99)
GLUCOSE-CAPILLARY: 97 mg/dL (ref 65–99)

## 2017-01-19 MED ORDER — OXYCODONE HCL 5 MG PO TABS: 5.0000 mg | ORAL_TABLET | Freq: Four times a day (QID) | ORAL | 0 refills | Status: DC | PRN

## 2017-01-19 MED ORDER — OXYCODONE HCL ER 10 MG PO T12A: 10.0000 mg | EXTENDED_RELEASE_TABLET | Freq: Two times a day (BID) | ORAL | 0 refills | Status: DC

## 2017-01-19 MED ORDER — INSULIN GLARGINE 100 UNIT/ML ~~LOC~~ SOLN: 10.0000 [IU] | Freq: Every day | SUBCUTANEOUS | 11 refills | Status: DC

## 2017-01-19 MED ORDER — DIGOXIN 125 MCG PO TABS: 0.1250 mg | ORAL_TABLET | Freq: Every day | ORAL | Status: DC

## 2017-01-19 NOTE — Clinical Social Work Note (Signed)
Patient to be d/c'ed today to [facility].  Patient and family agreeable to plans will transport via ems RN to call report to [phone].  Evette Cristal, MSW, [place]

## 2017-01-19 NOTE — Discharge Instructions (Signed)
Heart Failure Clinic appointment on January 20 2017 at 11:00am with Darylene Price, [place]. Please call [phone] to reschedule.

## 2017-01-19 NOTE — Care Management (Signed)
Was informed that patient is followed by [facility]. Informed agency that patient to go to [facility] for rehab within next 24 hours

## 2017-01-19 NOTE — Progress Notes (Signed)
ANTICOAGULATION CONSULT NOTE - Initial Consult  Pharmacy Consult for VKA Indication: AF  Allergies  Allergen Reactions  . Contrast Media [Iodinated Diagnostic Agents] Shortness Of Breath    SOB  . Iohexol Shortness Of Breath     Desc: Respiratory Distress, Laryngedema, diaphoresis, Onset Date: [id number]   . Latex Rash    Patient Measurements: Height: 5\' 7"  (170.2 cm) Weight: 171 lb 12.8 oz (77.9 kg) (bed weight) IBW/kg (Calculated) : 66.1 Heparin Dosing Weight:   Vital Signs: Temp: 98.4 F (36.9 C) (08/29 0550) Temp Source: Oral (08/29 0550) BP: 111/52 (08/29 0550) Pulse Rate: 79 (08/29 0550)  Labs:  Recent Labs  01/17/17 0541 01/18/17 0513 01/19/17 0616  HGB 11.5*  --   --   HCT 35.1*  --   --   PLT 323  --   --   LABPROT 20.4* 22.9* 26.4*  INR 1.72 1.99 2.45  CREATININE 0.54* 0.46*  --     Estimated Creatinine Clearance: 70 mL/min (A) (by C-G formula based on SCr of 0.46 mg/dL (L)).   Medical History: Past Medical History:  Diagnosis Date  . Arthritis   . Atrial fibrillation ([place])   . Carcinoma of prostate ([place])   . CHF (congestive heart failure) ([name])   . Chronic kidney disease   . Diabetes mellitus without complication ([place])   . ED (erectile dysfunction)   . Frequent urination   . Hyperlipidemia   . Hypertension   . Moderate mitral insufficiency   . Peripheral vascular disease ([place])   . Pneumonia 04/2016  . Prostate cancer ([place])   . Sleep apnea    OSA--USE C-PAP  . Ulcer of left foot due to type 2 diabetes mellitus ([place])   . Urinary stress incontinence, male     Medications:  Infusions:  . sodium chloride 50 mL/hr at 01/18/17 0503    Assessment: 81 yom POD 3 L BKA - per MDDew okay to resume AC. Pt currently receiving prophylactic doses of enoxapain. Will continue this until INR is therapeutic  Goal of Therapy:  INR 2-3 Monitor platelets by anticoagulation protocol: Yes   Plan:  Continue pt home dose of 5mg  daily. Warfarin resumed  8/25. INR increasing as expected. Recheck INR in the AM    8/28 INR 1.99, trending upwards on warfarin 5mg  daily home dose, will resume. Recheck INR in the AM   8/29 INR is therapeutic at 2.45. Will continue home dose of warfarin 5mg  daily. Recheck INR in the AM.   Lendon Ka, PharmD Pharmacy Resident  01/19/2017,7:31 AM

## 2017-01-19 NOTE — Progress Notes (Signed)
Called report to facility nurse at this time. Will call EMS for non-emergent transport at this time as well. Wenda Low [facility]

## 2017-01-19 NOTE — Discharge Summary (Signed)
[place] at [place] NAME: [name]    MR#:  [id number]  DATE OF BIRTH:  21-Jul-1934  DATE OF ADMISSION:  01/08/2017 ADMITTING PHYSICIAN: Loletha Grayer, MD  DATE OF DISCHARGE: 01/19/2017  PRIMARY CARE PHYSICIAN: Alanson Aly, FNP    ADMISSION DIAGNOSIS:  Confusion [R41.0] Hypoxia [R09.02] Atrial fibrillation with rapid ventricular response ([place]) [I48.91] Acute on chronic congestive heart failure, unspecified heart failure type ([place]) [I50.9]  DISCHARGE DIAGNOSIS:  Active Problems:   Gangrene ([place])   Pressure injury of skin   SECONDARY DIAGNOSIS:   Past Medical History:  Diagnosis Date  . Arthritis   . Atrial fibrillation ([place])   . Carcinoma of prostate ([place])   . CHF (congestive heart failure) ([place])   . Chronic kidney disease   . Diabetes mellitus without complication ([place])   . ED (erectile dysfunction)   . Frequent urination   . Hyperlipidemia   . Hypertension   . Moderate mitral insufficiency   . Peripheral vascular disease ([place])   . Pneumonia 04/2016  . Prostate cancer ([place])   . Sleep apnea    OSA--USE C-PAP  . Ulcer of left foot due to type 2 diabetes mellitus ([place])   . Urinary stress incontinence, male     HOSPITAL COURSE:   82 year old male with past medical history of diabetes type 2 without complication, chronic atrial fibrillation, peripheral vascular disease, who presented to the hospital due to shortness of breath, weakness and also noted to have left lower extremity gangrene and ischemia.  1. Left second toe gangrene-this was secondary to patient's diabetes and bad peripheral vascular disease. - pt. Was Seen by vascular surgery and is status post left below-the-knee amputation on 01/12/2017. - initially pt. Was having a lot of pain and was getting IV narcotics but they have been weaned and he is on Oral Oxycontin, Oxycodone for pain.   - pt. Was placed in an immobilizer as his Left lower extremitie  remained contracted but since his pain has improved his Immobilizer has been removed now.   - pt. Is being discharged to SNF/STR and will follow up with Vascular in 3 weeks.    2. Altered mental status/encephalopathy- this was secondary to increasing doses of narcotics. Pt's IV Narcotics were tapered and he was placed on a Oral regiment with  Oxycontin, PRN Oxycodone and his mental status has improved and has been stable on that regiment which he will cont. For now.   3. Chronic atrial fibrillation- pt's rates were quite labile initially due to pain but as his pain has improved his HR have improved too. His rate controlling meds had to be adjusted and he will cont. His Cardizem, Metoprolol, Digoxin upon discharge.   - he will continue his warfarin and his INR 2.4 today and therapeutic.   4.  Diabetes type 2 with PVD-pt's PO intake has been poor at times.  His Lantus dose has been adjusted.  No episodes of Hypoglycemia while in the hospital. - pt. Will resume his Metformin, Trulicity but these may need to be adjusted based on his PO intake and BS.   5. Hyperlipidemia- pt. Will continue his atorvastatin.  DISCHARGE CONDITIONS:   Stable  CONSULTS OBTAINED:  Treatment Team:  Isaias Cowman, MD  DRUG ALLERGIES:   Allergies  Allergen Reactions  . Contrast Media [Iodinated Diagnostic Agents] Shortness Of Breath    SOB  . Iohexol Shortness Of Breath     Desc: Respiratory Distress, Laryngedema, diaphoresis, Onset  Date: [id number]   . Latex Rash    DISCHARGE MEDICATIONS:   Allergies as of 01/19/2017      Reactions   Contrast Media [iodinated Diagnostic Agents] Shortness Of Breath   SOB   Iohexol Shortness Of Breath    Desc: Respiratory Distress, Laryngedema, diaphoresis, Onset Date: [id number]   Latex Rash      Medication List    STOP taking these medications   aspirin EC 81 MG tablet   HYDROcodone-acetaminophen 5-325 MG tablet Commonly known as:  NORCO   JARDIANCE 10  MG Tabs tablet Generic drug:  empagliflozin     TAKE these medications   acetaminophen 500 MG tablet Commonly known as:  TYLENOL Take 500 mg by mouth every 6 (six) hours as needed for moderate pain.   atorvastatin 80 MG tablet Commonly known as:  LIPITOR Take 80 mg by mouth daily at 6 PM.   digoxin 0.125 MG tablet Commonly known as:  LANOXIN Take 1 tablet (0.125 mg total) by mouth daily.   diltiazem 300 MG 24 hr capsule Commonly known as:  CARDIZEM CD Take 300 mg by mouth daily.   furosemide 20 MG tablet Commonly known as:  LASIX TAKE 1 TABLET DAILY   insulin glargine 100 UNIT/ML injection Commonly known as:  LANTUS Inject 0.1 mLs (10 Units total) into the skin at bedtime. What changed:  how much to take   metFORMIN 1000 MG tablet Commonly known as:  GLUCOPHAGE TAKE 1 TABLET TWICE A DAY WITH MEALS   oxyCODONE 5 MG immediate release tablet Commonly known as:  Oxy IR/ROXICODONE Take 1 tablet (5 mg total) by mouth every 6 (six) hours as needed for moderate pain.   oxyCODONE 10 mg 12 hr tablet Commonly known as:  OXYCONTIN Take 1 tablet (10 mg total) by mouth every 12 (twelve) hours.   TOPROL XL 100 MG 24 hr tablet Generic drug:  metoprolol succinate Take 100 mg by mouth daily.   TRULICITY 8.29 HB/7.1IR Sopn Generic drug:  Dulaglutide 0.75 mg once a week.   warfarin 5 MG tablet Commonly known as:  COUMADIN Take 5 mg by mouth daily.            Discharge Care Instructions        Start     Ordered   01/20/17 0000  digoxin (LANOXIN) 0.125 MG tablet  Daily     01/19/17 1119   01/19/17 0000  insulin glargine (LANTUS) 100 UNIT/ML injection  Daily at bedtime     01/19/17 1119   01/19/17 0000  oxyCODONE (OXY IR/ROXICODONE) 5 MG immediate release tablet  Every 6 hours PRN     01/19/17 1119   01/19/17 0000  oxyCODONE (OXYCONTIN) 10 mg 12 hr tablet  Every 12 hours     01/19/17 1119   01/19/17 0000  Activity as tolerated - No restrictions     01/19/17 1119    01/19/17 0000  Diet - low sodium heart healthy     01/19/17 1119   01/19/17 0000  Diet Carb Modified     01/19/17 1119        DISCHARGE INSTRUCTIONS:   DIET:  Cardiac diet and Diabetic diet  DISCHARGE CONDITION:  Stable  ACTIVITY:  Activity as tolerated  OXYGEN:  Home Oxygen: No.   Oxygen Delivery: room air  DISCHARGE LOCATION:  nursing home   If you experience worsening of your admission symptoms, develop shortness of breath, life threatening emergency, suicidal or homicidal thoughts you must seek medical attention  immediately by calling 911 or calling your MD immediately  if symptoms less severe.  You Must read complete instructions/literature along with all the possible adverse reactions/side effects for all the Medicines you take and that have been prescribed to you. Take any new Medicines after you have completely understood and accpet all the possible adverse reactions/side effects.   Please note  You were cared for by a hospitalist during your hospital stay. If you have any questions about your discharge medications or the care you received while you were in the hospital after you are discharged, you can call the unit and asked to speak with the hospitalist on call if the hospitalist that took care of you is not available. Once you are discharged, your primary care physician will handle any further medical issues. Please note that NO REFILLS for any discharge medications will be authorized once you are discharged, as it is imperative that you return to your primary care physician (or establish a relationship with a primary care physician if you do not have one) for your aftercare needs so that they can reassess your need for medications and monitor your lab values.     Today   Mental status improved.  Pain in the LLE improved.  No acute events overnight. HR stable.   VITAL SIGNS:  Blood pressure (!) 146/82, pulse 84, temperature 98.4 F (36.9 C), temperature source  Oral, resp. rate 19, height 5\' 7"  (1.702 m), weight 77.9 kg (171 lb 12.8 oz), SpO2 96 %.  I/O:  No intake or output data in the 24 hours ending 01/19/17 1124  PHYSICAL EXAMINATION:   GENERAL:  82 y.o.-year-old patient lying in bed in NAD.   EYES: Pupils equal, round, reactive to light and accommodation. No scleral icterus. Extraocular muscles intact.  HEENT: Head atraumatic, normocephalic. Oropharynx and nasopharynx clear.  NECK:  Supple, no jugular venous distention. No thyroid enlargement, no tenderness.  LUNGS: Poor Resp. effort, no wheezing, rales, rhonchi. No use of accessory muscles of respiration.  CARDIOVASCULAR: S1, S2 Irregular. No murmurs, rubs, or gallops.  ABDOMEN: Soft, nontender, nondistended. Bowel sounds present. No organomegaly or mass.  EXTREMITIES: No cyanosis, clubbing or edema b/l.  Left BKA with dressing in place.   NEUROLOGIC: Cranial nerves II through XII are intact. No focal Motor or sensory deficits b/l. Globally weak.    PSYCHIATRIC: The patient is alert and oriented x 3.  SKIN: No obvious rash, lesion, or ulcer.   DATA REVIEW:   CBC  Recent Labs Lab 01/17/17 0541  WBC 10.4  HGB 11.5*  HCT 35.1*  PLT 323    Chemistries   Recent Labs Lab 01/15/17 0444  01/18/17 0513  NA 136  < > 137  K 4.6  < > 3.9  CL 98*  < > 104  CO2 28  < > 29  GLUCOSE 139*  < > 115*  BUN 32*  < > 28*  CREATININE 0.95  < > 0.46*  CALCIUM 8.8*  < > 7.8*  MG 2.2  --   --   < > = values in this interval not displayed.  Cardiac Enzymes No results for input(s): TROPONINI in the last 168 hours.  Microbiology Results  Results for orders placed or performed during the hospital encounter of 01/08/17  Culture, blood (Routine x 2)     Status: None   Collection Time: 01/08/17 10:00 AM  Result Value Ref Range Status   Specimen Description BLOOD BLOOD RIGHT ARM  Final  Special Requests   Final    BOTTLES DRAWN AEROBIC AND ANAEROBIC Blood Culture adequate volume   Culture  NO GROWTH 5 DAYS  Final   Report Status 01/13/2017 FINAL  Final  Culture, blood (Routine x 2)     Status: None   Collection Time: 01/08/17 11:20 AM  Result Value Ref Range Status   Specimen Description BLOOD BLOOD LEFT WRIST  Final   Special Requests   Final    BOTTLES DRAWN AEROBIC AND ANAEROBIC Blood Culture adequate volume   Culture NO GROWTH 5 DAYS  Final   Report Status 01/13/2017 FINAL  Final    RADIOLOGY:  No results found.    Management plans discussed with the patient, family and they are in agreement.  CODE STATUS:     Code Status Orders        Start     Ordered   01/08/17 1342  Full code  Continuous     01/08/17 134    TOTAL TIME TAKING CARE OF THIS PATIENT: 40 minutes.    Henreitta Leber M.D on 01/19/2017 at 11:24 AM  Between 7am to 6pm - Pager - [phone]  After 6pm go to [email or web address] - Technical brewer [facility]  Office  [phone]  CC: Primary care physician; Alanson Aly, [place]

## 2017-01-20 ENCOUNTER — Ambulatory Visit: Payer: Medicare Other | Admitting: Family

## 2017-01-21 ENCOUNTER — Encounter: Payer: Self-pay | Admitting: Family

## 2017-01-21 ENCOUNTER — Ambulatory Visit: Payer: Medicare Other | Attending: Family | Admitting: Family

## 2017-01-21 VITALS — BP 116/70 | HR 113 | Resp 20 | Ht 67.0 in

## 2017-01-21 DIAGNOSIS — Z9079 Acquired absence of other genital organ(s): Secondary | ICD-10-CM | POA: Insufficient documentation

## 2017-01-21 DIAGNOSIS — Z9841 Cataract extraction status, right eye: Secondary | ICD-10-CM | POA: Insufficient documentation

## 2017-01-21 DIAGNOSIS — G4733 Obstructive sleep apnea (adult) (pediatric): Secondary | ICD-10-CM | POA: Insufficient documentation

## 2017-01-21 DIAGNOSIS — E785 Hyperlipidemia, unspecified: Secondary | ICD-10-CM | POA: Diagnosis not present

## 2017-01-21 DIAGNOSIS — E119 Type 2 diabetes mellitus without complications: Secondary | ICD-10-CM

## 2017-01-21 DIAGNOSIS — Z9842 Cataract extraction status, left eye: Secondary | ICD-10-CM | POA: Insufficient documentation

## 2017-01-21 DIAGNOSIS — L97529 Non-pressure chronic ulcer of other part of left foot with unspecified severity: Secondary | ICD-10-CM | POA: Insufficient documentation

## 2017-01-21 DIAGNOSIS — N189 Chronic kidney disease, unspecified: Secondary | ICD-10-CM | POA: Insufficient documentation

## 2017-01-21 DIAGNOSIS — Z87891 Personal history of nicotine dependence: Secondary | ICD-10-CM | POA: Diagnosis not present

## 2017-01-21 DIAGNOSIS — Z888 Allergy status to other drugs, medicaments and biological substances status: Secondary | ICD-10-CM | POA: Diagnosis not present

## 2017-01-21 DIAGNOSIS — I130 Hypertensive heart and chronic kidney disease with heart failure and stage 1 through stage 4 chronic kidney disease, or unspecified chronic kidney disease: Secondary | ICD-10-CM | POA: Diagnosis not present

## 2017-01-21 DIAGNOSIS — Z833 Family history of diabetes mellitus: Secondary | ICD-10-CM | POA: Insufficient documentation

## 2017-01-21 DIAGNOSIS — Z8249 Family history of ischemic heart disease and other diseases of the circulatory system: Secondary | ICD-10-CM | POA: Insufficient documentation

## 2017-01-21 DIAGNOSIS — N529 Male erectile dysfunction, unspecified: Secondary | ICD-10-CM | POA: Insufficient documentation

## 2017-01-21 DIAGNOSIS — I4891 Unspecified atrial fibrillation: Secondary | ICD-10-CM | POA: Insufficient documentation

## 2017-01-21 DIAGNOSIS — Z7902 Long term (current) use of antithrombotics/antiplatelets: Secondary | ICD-10-CM | POA: Diagnosis not present

## 2017-01-21 DIAGNOSIS — Z8546 Personal history of malignant neoplasm of prostate: Secondary | ICD-10-CM | POA: Insufficient documentation

## 2017-01-21 DIAGNOSIS — Z794 Long term (current) use of insulin: Secondary | ICD-10-CM | POA: Diagnosis not present

## 2017-01-21 DIAGNOSIS — Z9889 Other specified postprocedural states: Secondary | ICD-10-CM | POA: Insufficient documentation

## 2017-01-21 DIAGNOSIS — E1122 Type 2 diabetes mellitus with diabetic chronic kidney disease: Secondary | ICD-10-CM | POA: Insufficient documentation

## 2017-01-21 DIAGNOSIS — Z91041 Radiographic dye allergy status: Secondary | ICD-10-CM | POA: Diagnosis not present

## 2017-01-21 DIAGNOSIS — I5032 Chronic diastolic (congestive) heart failure: Secondary | ICD-10-CM | POA: Diagnosis present

## 2017-01-21 DIAGNOSIS — I10 Essential (primary) hypertension: Secondary | ICD-10-CM

## 2017-01-21 DIAGNOSIS — Z91040 Latex allergy status: Secondary | ICD-10-CM | POA: Diagnosis not present

## 2017-01-21 DIAGNOSIS — Z89512 Acquired absence of left leg below knee: Secondary | ICD-10-CM | POA: Insufficient documentation

## 2017-01-21 DIAGNOSIS — M1990 Unspecified osteoarthritis, unspecified site: Secondary | ICD-10-CM | POA: Diagnosis not present

## 2017-01-21 DIAGNOSIS — E11621 Type 2 diabetes mellitus with foot ulcer: Secondary | ICD-10-CM | POA: Diagnosis not present

## 2017-01-21 DIAGNOSIS — E1152 Type 2 diabetes mellitus with diabetic peripheral angiopathy with gangrene: Secondary | ICD-10-CM | POA: Diagnosis not present

## 2017-01-21 DIAGNOSIS — Z9049 Acquired absence of other specified parts of digestive tract: Secondary | ICD-10-CM | POA: Diagnosis not present

## 2017-01-21 DIAGNOSIS — I482 Chronic atrial fibrillation, unspecified: Secondary | ICD-10-CM

## 2017-01-21 NOTE — Progress Notes (Signed)
Patient ID: [name], male    DOB: [birth year], 82 y.o.   MRN: [id number]  HPI  [name] is a 82 y/o male with a history of atrial fibrillation, prostate cancer, CKD, DM, hyperlipidemia, HTN, PVD, obstructive sleep apnea (CPAP), gangrene, previous tobacco use and chronic heart failure.  Reviewed echo report done 05/19/16 which showed an EF of 60-65% along with moderate MR.   Admitted 01/08/17 due to left second toe gangrene secondary to diabetes and peripheral vascular disease. 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 Cardiology consult was also obtained. Developed altered mental status due to narcotics. Narcotics were tapered and mental status improved. Discharged to SNF after 11 days. Admitted 12/29/16 due to sepsis due to foot infection. Improved with IV antibiotics. Had gangrene and ulceration of the left foot. Vascular surgery consulted and recommended BKA but patient and wife wanted to consider options before deciding. Discharged the next day with antibiotics.   He presents today for his initial visit with a chief complaint of moderate fatigue with little exertion. He describes this as chronic in nature having been present for a few months. He has associated weakness along with this. Denies any shortness of breath, chest pain, dizziness or edema.   Past Medical History:  Diagnosis Date  . Arthritis   . Atrial fibrillation ([place])   . Carcinoma of prostate ([place])   . CHF (congestive heart failure) ([place])   . Chronic kidney disease   . Diabetes mellitus without complication ([place])   . ED (erectile dysfunction)   . Frequent urination   . Hyperlipidemia   . Hypertension   . Moderate mitral insufficiency   . Peripheral vascular disease ([place])   . Pneumonia 04/2016  . Prostate cancer ([place])   . Sleep apnea    OSA--USE C-PAP  . Ulcer of left foot due to type 2 diabetes mellitus ([place])   . Urinary stress incontinence, male    Past Surgical History:  Procedure  Laterality Date  . AMPUTATION Left 01/12/2017   Procedure: AMPUTATION BELOW KNEE;  Surgeon: Algernon Huxley, MD;  Location: [facility];  Service: General;  Laterality: Left;  . APLIGRAFT PLACEMENT Left 10/15/2016   Procedure: APLIGRAFT PLACEMENT;  Surgeon: Albertine Patricia, DPM;  Location: [facility];  Service: Podiatry;  Laterality: Left;  necrotic ulcer  . CHOLECYSTECTOMY    . EYE SURGERY Bilateral    Cataract Extraction with IOL  . IRRIGATION AND DEBRIDEMENT FOOT Left 10/15/2016   Procedure: IRRIGATION AND DEBRIDEMENT FOOT-EXCISIONAL DEBRIDEMENT OF SKIN 3RD, 4TH AND 5TH TOES WITH APPLICATION OF APLIGRAFT;  Surgeon: Albertine Patricia, DPM;  Location: [facility];  Service: Podiatry;  Laterality: Left;  necrotic,gangrene  . IRRIGATION AND DEBRIDEMENT FOOT Left 12/09/2016   Procedure: IRRIGATION AND DEBRIDEMENT FOOT-MUSCLE/FASCIA, RESECTION OF FOURTH AND FIFTH METATARSAL NECROTIC BONE;  Surgeon: Albertine Patricia, DPM;  Location: [facility];  Service: Podiatry;  Laterality: Left;  . LOWER EXTREMITY ANGIOGRAPHY Left 08/16/2016   Procedure: Lower Extremity Angiography;  Surgeon: Algernon Huxley, MD;  Location: [place] [facility];  Service: Cardiovascular;  Laterality: Left;  . LOWER EXTREMITY ANGIOGRAPHY Left 09/01/2016   Procedure: Lower Extremity Angiography;  Surgeon: Algernon Huxley, MD;  Location: [place] [facility];  Service: Cardiovascular;  Laterality: Left;  . LOWER EXTREMITY ANGIOGRAPHY Left 10/14/2016   Procedure: Lower Extremity Angiography;  Surgeon: Algernon Huxley, MD;  Location: [place] [facility];  Service: Cardiovascular;  Laterality: Left;  . LOWER EXTREMITY ANGIOGRAPHY Left 12/20/2016  Procedure: Lower Extremity Angiography;  Surgeon: Algernon Huxley, MD;  Location: [place] [facility];  Service: Cardiovascular;  Laterality: Left;  . LOWER EXTREMITY INTERVENTION  09/01/2016   Procedure: Lower Extremity Intervention;  Surgeon: Algernon Huxley, MD;  Location: [place] [facility];  Service: Cardiovascular;;  .  PROSTATE SURGERY     removal  . TONSILLECTOMY     as a child   Family History  Problem Relation Age of Onset  . Hypertension Mother   . Diabetes Mother   . Prostate cancer Neg Hx   . Bladder Cancer Neg Hx   . Kidney disease Neg Hx    Social History  Substance Use Topics  . Smoking status: Former Smoker    Packs/day: 0.25    Types: Cigarettes    Quit date: 10/14/1994  . Smokeless tobacco: Never Used  . Alcohol use No   Allergies  Allergen Reactions  . Contrast Media [Iodinated Diagnostic Agents] Shortness Of Breath    SOB  . Iohexol Shortness Of Breath     Desc: Respiratory Distress, Laryngedema, diaphoresis, Onset Date: 10175102   . Latex Rash   Prior to Admission medications   Medication Sig Start Date End Date Taking? Authorizing Provider  acetaminophen (TYLENOL) 500 MG tablet Take 500 mg by mouth every 6 (six) hours as needed for moderate pain.   Yes [provider]  atorvastatin (LIPITOR) 80 MG tablet Take 80 mg by mouth daily at 6 PM.    Yes [provider]  digoxin (LANOXIN) 0.125 MG tablet Take 1 tablet (0.125 mg total) by mouth daily. 01/20/17  Yes Henreitta Leber, MD  diltiazem (CARDIZEM CD) 300 MG 24 hr capsule Take 300 mg by mouth daily.   Yes [provider]  furosemide (LASIX) 20 MG tablet TAKE 1 TABLET DAILY 10/24/15  Yes [provider]  insulin glargine (LANTUS) 100 UNIT/ML injection Inject 0.1 mLs (10 Units total) into the skin at bedtime. 01/19/17  Yes Henreitta Leber, MD  metFORMIN (GLUCOPHAGE) 1000 MG tablet TAKE 1 TABLET TWICE A DAY WITH MEALS 11/28/15  Yes [provider]  oxyCODONE (OXY IR/ROXICODONE) 5 MG immediate release tablet Take 1 tablet (5 mg total) by mouth every 6 (six) hours as needed for moderate pain. 01/19/17  Yes Henreitta Leber, MD  oxyCODONE (OXYCONTIN) 10 mg 12 hr tablet Take 1 tablet (10 mg total) by mouth every 12 (twelve) hours. 01/19/17  Yes Sainani, Belia Heman, MD  TOPROL XL 100 MG 24 hr tablet  Take 100 mg by mouth daily. 12/25/16  Yes [provider]  TRULICITY 5.85 ID/7.8EU SOPN 0.75 mg once a week. 11/14/16  Yes [provider]  warfarin (COUMADIN) 5 MG tablet Take 5 mg by mouth daily.   Yes [provider]    Review of Systems  Constitutional: Positive for fatigue. Negative for appetite change.  HENT: Positive for hearing loss. Negative for congestion and sore throat.   Eyes: Negative.   Respiratory: Negative for chest tightness and shortness of breath.   Cardiovascular: Negative for chest pain, palpitations and leg swelling.  Gastrointestinal: Negative for abdominal distention and abdominal pain.  Endocrine: Negative.   Genitourinary: Negative.   Musculoskeletal: Positive for arthralgias (both hips).  Skin: Positive for wound (left BKS).  Allergic/Immunologic: Negative.   Neurological: Positive for weakness. Negative for dizziness and light-headedness.  Hematological: Negative for adenopathy. Bruises/bleeds easily.  Psychiatric/Behavioral: Positive for dysphoric mood. Negative for sleep disturbance. The patient is not nervous/anxious.  Vitals:   01/21/17 1233  BP: 116/70  Pulse: (!) 113  Resp: 20  SpO2: 99%  Height: 5\' 7"  (1.702 m)   Wt Readings from Last 3 Encounters:  01/14/17 171 lb 12.8 oz (77.9 kg)  12/29/16 190 lb (86.2 kg)  12/20/16 190 lb (86.2 kg)   Lab Results  Component Value Date   CREATININE 0.46 (L) 01/18/2017   CREATININE 0.54 (L) 01/17/2017   CREATININE 0.95 01/15/2017   Physical Exam  Constitutional: He is oriented to person, place, and time. He appears well-developed and well-nourished.  HENT:  Head: Normocephalic and atraumatic.  Neck: Normal range of motion. Neck supple. No JVD present.  Cardiovascular: Regular rhythm.  Tachycardia present.   Pulmonary/Chest: Effort normal. He has no wheezes. He has no rales.  Abdominal: Soft. He exhibits no distension. There is no tenderness.  Musculoskeletal: He exhibits  deformity (left BKA). He exhibits no edema or tenderness.  Neurological: He is alert and oriented to person, place, and time.  Skin: Skin is warm and dry.  Psychiatric: He has a normal mood and affect. His behavior is normal. Thought content normal.  Nursing note and vitals reviewed.   Assessment & Plan:  1: Chronic heart failure with preserved ejection fraction- - NYHA class III - euvolemic - currently not weighing daily as he's recently had a left BKA and has just started PT - not adding salt to his food and wife tries to read food labels. Discussed the importance of closely following a 2000mg  sodium diet especially when he comes home from [place] - saw cardiologist Nehemiah Massed) 11/09/16  2: HTN- - BP looks good today - BMP done 01/18/17 reviewed and shows a sodium 137, potassium 3.9 and GFR of >60  3: Diabetes- - s/p recent left BKA; admits to some difficulty adjusting to not having part of his leg - A1c done 12/30/16 was 7.4% - saw endocrinologist Eddie Dibbles) 11/22/16  4: Atrial fibrillation- - currently tachycardic but patient admits that he was nervous coming to the appointment today - could titrate up toprol in the future if needed  Facility medication list was reviewed.  Return here in 1 month or sooner for any questions/problems before then.

## 2017-01-27 ENCOUNTER — Encounter: Payer: Self-pay | Admitting: Emergency Medicine

## 2017-01-27 ENCOUNTER — Emergency Department
Admission: EM | Admit: 2017-01-27 | Discharge: 2017-01-27 | Disposition: A | Discharge status: Home / Self Care | Payer: Medicare Other | Attending: Emergency Medicine | Admitting: Emergency Medicine

## 2017-01-27 DIAGNOSIS — L8991 Pressure ulcer of unspecified site, stage 1: Secondary | ICD-10-CM | POA: Diagnosis not present

## 2017-01-27 DIAGNOSIS — Z87891 Personal history of nicotine dependence: Secondary | ICD-10-CM | POA: Insufficient documentation

## 2017-01-27 DIAGNOSIS — Z794 Long term (current) use of insulin: Secondary | ICD-10-CM | POA: Insufficient documentation

## 2017-01-27 DIAGNOSIS — I5032 Chronic diastolic (congestive) heart failure: Secondary | ICD-10-CM | POA: Diagnosis not present

## 2017-01-27 DIAGNOSIS — E1122 Type 2 diabetes mellitus with diabetic chronic kidney disease: Secondary | ICD-10-CM | POA: Diagnosis not present

## 2017-01-27 DIAGNOSIS — R791 Abnormal coagulation profile: Secondary | ICD-10-CM | POA: Insufficient documentation

## 2017-01-27 DIAGNOSIS — Z79899 Other long term (current) drug therapy: Secondary | ICD-10-CM | POA: Diagnosis not present

## 2017-01-27 DIAGNOSIS — Z91040 Latex allergy status: Secondary | ICD-10-CM | POA: Diagnosis not present

## 2017-01-27 DIAGNOSIS — I130 Hypertensive heart and chronic kidney disease with heart failure and stage 1 through stage 4 chronic kidney disease, or unspecified chronic kidney disease: Secondary | ICD-10-CM | POA: Diagnosis not present

## 2017-01-27 DIAGNOSIS — N189 Chronic kidney disease, unspecified: Secondary | ICD-10-CM | POA: Diagnosis not present

## 2017-01-27 DIAGNOSIS — Z7901 Long term (current) use of anticoagulants: Secondary | ICD-10-CM | POA: Insufficient documentation

## 2017-01-27 LAB — BASIC METABOLIC PANEL
ANION GAP: 11 (ref 5–15)
BUN: 22 mg/dL — ABNORMAL HIGH (ref 6–20)
CALCIUM: 8.7 mg/dL — AB (ref 8.9–10.3)
CO2: 27 mmol/L (ref 22–32)
Chloride: 97 mmol/L — ABNORMAL LOW (ref 101–111)
Creatinine, Ser: 0.91 mg/dL (ref 0.61–1.24)
Glucose, Bld: 163 mg/dL — ABNORMAL HIGH (ref 65–99)
Potassium: 3.8 mmol/L (ref 3.5–5.1)
SODIUM: 135 mmol/L (ref 135–145)

## 2017-01-27 LAB — CBC
HCT: 37.5 % — ABNORMAL LOW (ref 40.0–52.0)
Hemoglobin: 12.7 g/dL — ABNORMAL LOW (ref 13.0–18.0)
MCH: 28.1 pg (ref 26.0–34.0)
MCHC: 33.8 g/dL (ref 32.0–36.0)
MCV: 83.3 fL (ref 80.0–100.0)
PLATELETS: 392 10*3/uL (ref 150–440)
RBC: 4.51 MIL/uL (ref 4.40–5.90)
RDW: 16.7 % — AB (ref 11.5–14.5)
WBC: 10.9 10*3/uL — AB (ref 3.8–10.6)

## 2017-01-27 LAB — PROTIME-INR
INR: 4.17 — AB
Prothrombin Time: 40 seconds — ABNORMAL HIGH (ref 11.4–15.2)

## 2017-01-27 LAB — GLUCOSE, CAPILLARY: GLUCOSE-CAPILLARY: 152 mg/dL — AB (ref 65–99)

## 2017-01-27 MED ORDER — OXYCODONE HCL 5 MG PO TABS
5.0000 mg | ORAL_TABLET | Freq: Once | ORAL | Status: AC
  Administered 2017-01-27: 5 mg via ORAL
  Filled 2017-01-27: qty 1

## 2017-01-27 MED ORDER — ACETAMINOPHEN 500 MG PO TABS
1000.0000 mg | ORAL_TABLET | Freq: Once | ORAL | Status: AC
  Administered 2017-01-27: 1000 mg via ORAL
  Filled 2017-01-27: qty 2

## 2017-01-27 NOTE — ED Triage Notes (Signed)
Pt in via [facility] from [place]; sent over for elevated INR.  Pt with complaints of pain to sacrum, beginning stages of skin break down noted to area.  Pt with left BKA x 10 days ago, reports increased pain to knee.  Vitals WDL, NAD noted at this time.

## 2017-01-27 NOTE — Discharge Instructions (Signed)
Decubitus ulcer: change dressing daily, keep dry dressing. Change patient's position every hour. Do not leave patient laying on his back. Follow up with wound clinic in 2-5 days.  INR: INR here elevated at 4. Hold coumadin today, repeat INR tomorrow. Ok to re-start coumadin once INR is 2.

## 2017-01-27 NOTE — ED Provider Notes (Signed)
[facility] Emergency Department Provider Note  ____________________________________________  Time seen: Approximately 3:47 PM  I have reviewed the triage vital signs and the nursing notes.   HISTORY  Chief Complaint Abnormal Lab   HPI [name] is a 82 y.o. male with h/o afib on coumadin who presents from rehab for supratherapeutic INR.patient is postop day 14 from a left BKA. Last coumadin was 9/4, labs from yesterday showing INR >18. Patient denies bleeding, hemoptysis, epistaxis, hematuria, melena, hematemesis. Patient's only complaint at this time is dull constant non radiating pain in his left leg which he has had since the surgery, the pain is now moderate to severe since he missed his afternoon dose of pain medication. No fever or chills.  Past Medical History:  Diagnosis Date  . Arthritis   . Atrial fibrillation ([place])   . Carcinoma of prostate ([place])   . CHF (congestive heart failure) ([place])   . Chronic kidney disease   . Diabetes mellitus without complication ([place])   . ED (erectile dysfunction)   . Frequent urination   . Hyperlipidemia   . Hypertension   . Moderate mitral insufficiency   . Peripheral vascular disease ([place])   . Pneumonia 04/2016  . Prostate cancer ([place])   . Sleep apnea    OSA--USE C-PAP  . Ulcer of left foot due to type 2 diabetes mellitus ([place])   . Urinary stress incontinence, male     Patient Active Problem List   Diagnosis Date Noted  . Chronic diastolic heart failure ([place]) 01/21/2017  . Pressure injury of skin 01/18/2017  . 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 Diabetic foot ulcer ([place]) 12/29/2016  . Ataxia 10/21/2016  . History of femoral angiogram 09/06/2016  . Left leg pain 09/06/2016  . Leukocytosis 09/06/2016  . Generalized weakness 09/06/2016  . A-fib ([place]) 08/30/2016  . Pneumonia 05/19/2016  . Hyperlipidemia 04/20/2016  . Back pain 04/19/2016  . Type 2  diabetes mellitus treated with insulin ([place]) 04/19/2016  . Essential hypertension 04/19/2016  . PAD (peripheral artery disease) ([place]) 04/19/2016  . Erectile dysfunction following radical prostatectomy 06/25/2015  . History of prostate cancer 12/23/2014  . Incontinence 12/23/2014    Past Surgical History:  Procedure Laterality Date  . AMPUTATION Left 01/12/2017   Procedure: AMPUTATION BELOW KNEE;  Surgeon: Algernon Huxley, MD;  Location: [facility];  Service: General;  Laterality: Left;  . APLIGRAFT PLACEMENT Left 10/15/2016   Procedure: APLIGRAFT PLACEMENT;  Surgeon: Albertine Patricia, DPM;  Location: [facility];  Service: Podiatry;  Laterality: Left;  necrotic ulcer  . CHOLECYSTECTOMY    . EYE SURGERY Bilateral    Cataract Extraction with IOL  . IRRIGATION AND DEBRIDEMENT FOOT Left 10/15/2016   Procedure: IRRIGATION AND DEBRIDEMENT FOOT-EXCISIONAL DEBRIDEMENT OF SKIN 3RD, 4TH AND 5TH TOES WITH APPLICATION OF APLIGRAFT;  Surgeon: Albertine Patricia, DPM;  Location: [facility];  Service: Podiatry;  Laterality: Left;  necrotic,gangrene  . IRRIGATION AND DEBRIDEMENT FOOT Left 12/09/2016   Procedure: IRRIGATION AND DEBRIDEMENT FOOT-MUSCLE/FASCIA, RESECTION OF FOURTH AND FIFTH METATARSAL NECROTIC BONE;  Surgeon: Albertine Patricia, DPM;  Location: [facility];  Service: Podiatry;  Laterality: Left;  . LOWER EXTREMITY ANGIOGRAPHY Left 08/16/2016   Procedure: Lower Extremity Angiography;  Surgeon: Algernon Huxley, MD;  Location: [place] [facility];  Service: Cardiovascular;  Laterality: Left;  . LOWER EXTREMITY ANGIOGRAPHY Left 09/01/2016   Procedure: Lower Extremity Angiography;  Surgeon: Algernon Huxley, MD;  Location: [facility];  Service: Cardiovascular;  Laterality: Left;  . LOWER EXTREMITY ANGIOGRAPHY Left 10/14/2016   Procedure: Lower Extremity Angiography;  Surgeon: Algernon Huxley, MD;  Location: [place] [facility];  Service: Cardiovascular;  Laterality: Left;  . LOWER EXTREMITY ANGIOGRAPHY Left 12/20/2016    Procedure: Lower Extremity Angiography;  Surgeon: Algernon Huxley, MD;  Location: [place] [facility];  Service: Cardiovascular;  Laterality: Left;  . LOWER EXTREMITY INTERVENTION  09/01/2016   Procedure: Lower Extremity Intervention;  Surgeon: Algernon Huxley, MD;  Location: [place] [facility];  Service: Cardiovascular;;  . PROSTATE SURGERY     removal  . TONSILLECTOMY     as a child    Prior to Admission medications   Medication Sig Start Date End Date Taking? Authorizing Provider  acetaminophen (TYLENOL) 500 MG tablet Take 500 mg by mouth every 6 (six) hours as needed for moderate pain.    [provider]  atorvastatin (LIPITOR) 80 MG tablet Take 80 mg by mouth daily at 6 PM.     [provider]  digoxin (LANOXIN) 0.125 MG tablet Take 1 tablet (0.125 mg total) by mouth daily. 01/20/17   Jesse Leber, MD  diltiazem (CARDIZEM CD) 300 MG 24 hr capsule Take 300 mg by mouth daily.    [provider]  furosemide (LASIX) 20 MG tablet TAKE 1 TABLET DAILY 10/24/15   [provider]  insulin glargine (LANTUS) 100 UNIT/ML injection Inject 0.1 mLs (10 Units total) into the skin at bedtime. 01/19/17   Jesse Leber, MD  metFORMIN (GLUCOPHAGE) 1000 MG tablet TAKE 1 TABLET TWICE A DAY WITH MEALS 11/28/15   [provider]  oxyCODONE (OXY IR/ROXICODONE) 5 MG immediate release tablet Take 1 tablet (5 mg total) by mouth every 6 (six) hours as needed for moderate pain. 01/19/17   Jesse Leber, MD  oxyCODONE (OXYCONTIN) 10 mg 12 hr tablet Take 1 tablet (10 mg total) by mouth every 12 (twelve) hours. 01/19/17   Sainani, Belia Heman, MD  TOPROL XL 100 MG 24 hr tablet Take 100 mg by mouth daily. 12/25/16   [provider]  TRULICITY 2.83 TD/1.7OH SOPN 0.75 mg once a week. 11/14/16   [provider]  warfarin (COUMADIN) 5 MG tablet Take 5 mg by mouth daily.    [provider]    Allergies Contrast media [iodinated diagnostic agents]; Iohexol; and  Latex  Family History  Problem Relation Age of Onset  . Hypertension Mother   . Diabetes Mother   . Prostate cancer Neg Hx   . Bladder Cancer Neg Hx   . Kidney disease Neg Hx     Social History Social History  Substance Use Topics  . Smoking status: Former Smoker    Packs/day: 0.25    Types: Cigarettes    Quit date: 10/14/1994  . Smokeless tobacco: Never Used  . Alcohol use No    Review of Systems  Constitutional: Negative for fever. Eyes: Negative for visual changes. ENT: Negative for sore throat. Neck: No neck pain  Cardiovascular: Negative for chest pain. Respiratory: Negative for shortness of breath. Gastrointestinal: Negative for abdominal pain, vomiting or diarrhea. Genitourinary: Negative for dysuria. Musculoskeletal: Negative for back pain. + LLE pain Skin: Negative for rash. Neurological: Negative for headaches, weakness or numbness. Psych: No SI or HI  ____________________________________________   PHYSICAL EXAM:  VITAL SIGNS: ED Triage Vitals  Enc Vitals Group     BP 01/27/17 1523 140/78     Pulse  Rate 01/27/17 1523 78     Resp 01/27/17 1523 14     Temp 01/27/17 1523 97.9 F (36.6 C)     Temp Source 01/27/17 1523 Oral     SpO2 01/27/17 1523 99 %     Weight 01/27/17 1524 180 lb (81.6 kg)     Height 01/27/17 1524 5\' 7"  (1.702 m)     Head Circumference --      Peak Flow --      Pain Score --      Pain Loc --      Pain Edu? --      Excl. in [place]? --     Constitutional: Alert and oriented. Well appearing and in no apparent distress. HEENT:      Head: Normocephalic and atraumatic.         Eyes: Conjunctivae are normal. Sclera is non-icteric.       Mouth/Throat: Mucous membranes are moist.       Neck: Supple with no signs of meningismus. Cardiovascular: Regular rate and rhythm. No murmurs, gallops, or rubs. 2+ symmetrical distal pulses are present in all extremities. No JVD. Respiratory: Normal respiratory effort. Lungs are clear to auscultation  bilaterally. No wheezes, crackles, or rhonchi.  Gastrointestinal: Soft, non tender, and non distended with positive bowel sounds. No rebound or guarding. Musculoskeletal: Well healing BKA Neurologic: Normal speech and language. Face is symmetric. Moving all extremities. No gross focal neurologic deficits are appreciated. Skin: Stage I decubitus ulcer Psychiatric: Mood and affect are normal. Speech and behavior are normal.  ____________________________________________   LABS (all labs ordered are listed, but only abnormal results are displayed)  Labs Reviewed  GLUCOSE, CAPILLARY - Abnormal; Notable for the following:       Result Value   Glucose-Capillary 152 (*)    All other components within normal limits  CBC - Abnormal; Notable for the following:    WBC 10.9 (*)    Hemoglobin 12.7 (*)    HCT 37.5 (*)    RDW 16.7 (*)    All other components within normal limits  BASIC METABOLIC PANEL - Abnormal; Notable for the following:    Chloride 97 (*)    Glucose, Bld 163 (*)    BUN 22 (*)    Calcium 8.7 (*)    All other components within normal limits  PROTIME-INR - Abnormal; Notable for the following:    Prothrombin Time 40.0 (*)    INR 4.17 (*)    All other components within normal limits   ____________________________________________  EKG  ED ECG REPORT I, Rudene Re, the attending physician, personally viewed and interpreted this ECG.  Atrial fibrillation, rate of 87, normal QTC, normal axis, no ST elevations or depressions, flattening T waves diffusely. ____________________________________________  RADIOLOGY  none  ____________________________________________   PROCEDURES  Procedure(s) performed: None Procedures Critical Care performed:  None ____________________________________________   INITIAL IMPRESSION / ASSESSMENT AND PLAN / ED COURSE   82 y.o. male with h/o afib on coumadin who presents from rehab for supratherapeutic INR. last Coumadin dose was 2  days ago. No signs or symptoms of active bleeding. We'll check H&H, repeat INR. I spoke with the wound clinic about his decubitus ulcer who recommended dry dressing over the decubitus ulcer, position changes every hour, and recommended the patient does not lie on his back. These recommendations will be sent over to rehabilitation. Patient is going to be referred to the wound clinic for further management of his decubitus ulcer.  _________________________ 4:49 PM on 01/27/2017 -----------------------------------------  INR 4.17, recommended holding coumadin until INR is therapeutic range. No evidence of bleeding. Ulcer dressed. Wound care instruction provided to rehab. Follow up with wounc clinic.  Pertinent labs & imaging results that were available during my care of the patient were reviewed by me and considered in my medical decision making (see chart for details).    ____________________________________________   FINAL CLINICAL IMPRESSION(S) / ED DIAGNOSES  Final diagnoses:  Supratherapeutic INR  Pressure ulcer, stage 1, unspecified location      NEW MEDICATIONS STARTED DURING THIS VISIT:  New Prescriptions   No medications on file     Note:  This document was prepared using Dragon voice recognition software and may include unintentional dictation errors.    Alfred Levins, [place], MD 01/27/17 [phone]

## 2017-02-01 DIAGNOSIS — Z89511 Acquired absence of right leg below knee: Secondary | ICD-10-CM | POA: Insufficient documentation

## 2017-02-01 DIAGNOSIS — Z89512 Acquired absence of left leg below knee: Secondary | ICD-10-CM | POA: Insufficient documentation

## 2017-02-08 ENCOUNTER — Telehealth (INDEPENDENT_AMBULATORY_CARE_PROVIDER_SITE_OTHER): Payer: Self-pay

## 2017-02-08 NOTE — Telephone Encounter (Signed)
Patient son [name]) was concerned about his father only getting 30 minutes each of physical,occupational,and speech therapy.The facility that he is at had stated that it was up to his insurance on what amount of time he will receive.The son wanted to see if JD could increase the time,I spoke with JD and he inform that he would write for the therapy department to increase to an hour but it still will be up to the insurance.

## 2017-02-10 ENCOUNTER — Encounter: Payer: Self-pay | Admitting: Emergency Medicine

## 2017-02-10 ENCOUNTER — Telehealth (INDEPENDENT_AMBULATORY_CARE_PROVIDER_SITE_OTHER): Payer: Self-pay

## 2017-02-10 ENCOUNTER — Other Ambulatory Visit (INDEPENDENT_AMBULATORY_CARE_PROVIDER_SITE_OTHER): Payer: Self-pay | Admitting: Vascular Surgery

## 2017-02-10 ENCOUNTER — Inpatient Hospital Stay
Admission: EM | Admit: 2017-02-10 | Discharge: 2017-02-12 | DRG: 565 | Disposition: A | Discharge status: Skilled Nursing Facility | Payer: Medicare Other | Attending: Vascular Surgery | Admitting: Vascular Surgery

## 2017-02-10 DIAGNOSIS — T8189XA Other complications of procedures, not elsewhere classified, initial encounter: Secondary | ICD-10-CM

## 2017-02-10 DIAGNOSIS — Z833 Family history of diabetes mellitus: Secondary | ICD-10-CM | POA: Diagnosis not present

## 2017-02-10 DIAGNOSIS — R52 Pain, unspecified: Secondary | ICD-10-CM

## 2017-02-10 DIAGNOSIS — I5032 Chronic diastolic (congestive) heart failure: Secondary | ICD-10-CM | POA: Diagnosis present

## 2017-02-10 DIAGNOSIS — Z794 Long term (current) use of insulin: Secondary | ICD-10-CM | POA: Diagnosis not present

## 2017-02-10 DIAGNOSIS — Z87891 Personal history of nicotine dependence: Secondary | ICD-10-CM | POA: Diagnosis not present

## 2017-02-10 DIAGNOSIS — M79662 Pain in left lower leg: Secondary | ICD-10-CM | POA: Diagnosis present

## 2017-02-10 DIAGNOSIS — L089 Local infection of the skin and subcutaneous tissue, unspecified: Secondary | ICD-10-CM

## 2017-02-10 DIAGNOSIS — E785 Hyperlipidemia, unspecified: Secondary | ICD-10-CM | POA: Diagnosis present

## 2017-02-10 DIAGNOSIS — T879 Unspecified complications of amputation stump: Secondary | ICD-10-CM | POA: Insufficient documentation

## 2017-02-10 DIAGNOSIS — N183 Chronic kidney disease, stage 3 (moderate): Secondary | ICD-10-CM | POA: Diagnosis present

## 2017-02-10 DIAGNOSIS — T8744 Infection of amputation stump, left lower extremity: Secondary | ICD-10-CM | POA: Diagnosis present

## 2017-02-10 DIAGNOSIS — S88112A Complete traumatic amputation at level between knee and ankle, left lower leg, initial encounter: Secondary | ICD-10-CM | POA: Diagnosis present

## 2017-02-10 DIAGNOSIS — Z8249 Family history of ischemic heart disease and other diseases of the circulatory system: Secondary | ICD-10-CM

## 2017-02-10 DIAGNOSIS — I482 Chronic atrial fibrillation: Secondary | ICD-10-CM | POA: Diagnosis present

## 2017-02-10 DIAGNOSIS — Z8546 Personal history of malignant neoplasm of prostate: Secondary | ICD-10-CM | POA: Diagnosis not present

## 2017-02-10 DIAGNOSIS — Z89512 Acquired absence of left leg below knee: Secondary | ICD-10-CM

## 2017-02-10 DIAGNOSIS — Y835 Amputation of limb(s) as the cause of abnormal reaction of the patient, or of later complication, without mention of misadventure at the time of the procedure: Secondary | ICD-10-CM | POA: Diagnosis present

## 2017-02-10 DIAGNOSIS — M25531 Pain in right wrist: Secondary | ICD-10-CM | POA: Diagnosis present

## 2017-02-10 DIAGNOSIS — T8789 Other complications of amputation stump: Principal | ICD-10-CM

## 2017-02-10 DIAGNOSIS — T148XXA Other injury of unspecified body region, initial encounter: Secondary | ICD-10-CM

## 2017-02-10 DIAGNOSIS — E1122 Type 2 diabetes mellitus with diabetic chronic kidney disease: Secondary | ICD-10-CM | POA: Diagnosis present

## 2017-02-10 LAB — CBC WITH DIFFERENTIAL/PLATELET
Basophils Absolute: 0.1 10*3/uL (ref 0–0.1)
Basophils Relative: 1 %
EOS ABS: 0.6 10*3/uL (ref 0–0.7)
EOS PCT: 5 %
HCT: 31.5 % — ABNORMAL LOW (ref 40.0–52.0)
Hemoglobin: 10.4 g/dL — ABNORMAL LOW (ref 13.0–18.0)
LYMPHS ABS: 1.2 10*3/uL (ref 1.0–3.6)
LYMPHS PCT: 10 %
MCH: 27.1 pg (ref 26.0–34.0)
MCHC: 33 g/dL (ref 32.0–36.0)
MCV: 82 fL (ref 80.0–100.0)
MONO ABS: 0.8 10*3/uL (ref 0.2–1.0)
Monocytes Relative: 7 %
Neutro Abs: 9.9 10*3/uL — ABNORMAL HIGH (ref 1.4–6.5)
Neutrophils Relative %: 77 %
PLATELETS: 357 10*3/uL (ref 150–440)
RBC: 3.84 MIL/uL — ABNORMAL LOW (ref 4.40–5.90)
RDW: 16.9 % — ABNORMAL HIGH (ref 11.5–14.5)
WBC: 12.6 10*3/uL — ABNORMAL HIGH (ref 3.8–10.6)

## 2017-02-10 LAB — COMPREHENSIVE METABOLIC PANEL
ALT: 27 U/L (ref 17–63)
AST: 34 U/L (ref 15–41)
Albumin: 2.6 g/dL — ABNORMAL LOW (ref 3.5–5.0)
Alkaline Phosphatase: 213 U/L — ABNORMAL HIGH (ref 38–126)
Anion gap: 11 (ref 5–15)
BUN: 19 mg/dL (ref 6–20)
CALCIUM: 8.2 mg/dL — AB (ref 8.9–10.3)
CO2: 26 mmol/L (ref 22–32)
Chloride: 99 mmol/L — ABNORMAL LOW (ref 101–111)
Creatinine, Ser: 0.85 mg/dL (ref 0.61–1.24)
GLUCOSE: 244 mg/dL — AB (ref 65–99)
POTASSIUM: 4.5 mmol/L (ref 3.5–5.1)
SODIUM: 136 mmol/L (ref 135–145)
TOTAL PROTEIN: 6.1 g/dL — AB (ref 6.5–8.1)
Total Bilirubin: 0.5 mg/dL (ref 0.3–1.2)

## 2017-02-10 LAB — GLUCOSE, CAPILLARY: GLUCOSE-CAPILLARY: 108 mg/dL — AB (ref 65–99)

## 2017-02-10 LAB — PROTIME-INR
INR: 1.44
Prothrombin Time: 17.4 seconds — ABNORMAL HIGH (ref 11.4–15.2)

## 2017-02-10 MED ORDER — INSULIN GLARGINE 100 UNIT/ML ~~LOC~~ SOLN
10.0000 [IU] | Freq: Every day | SUBCUTANEOUS | Status: DC
  Administered 2017-02-10: 10 [IU] via SUBCUTANEOUS
  Filled 2017-02-10 (×2): qty 0.1

## 2017-02-10 MED ORDER — HEPARIN SODIUM (PORCINE) 5000 UNIT/ML IJ SOLN: 5000.0000 [IU] | Freq: Three times a day (TID) | INTRAMUSCULAR | Status: DC

## 2017-02-10 MED ORDER — PIPERACILLIN-TAZOBACTAM 3.375 G IVPB 30 MIN
INTRAVENOUS | Status: AC
  Filled 2017-02-10: qty 50

## 2017-02-10 MED ORDER — OXYCODONE HCL 5 MG PO TABS
5.0000 mg | ORAL_TABLET | Freq: Four times a day (QID) | ORAL | Status: DC | PRN
  Administered 2017-02-12: 5 mg via ORAL
  Filled 2017-02-10 (×2): qty 1

## 2017-02-10 MED ORDER — ONDANSETRON HCL 4 MG/2ML IJ SOLN: 4.0000 mg | Freq: Four times a day (QID) | INTRAMUSCULAR | Status: DC | PRN

## 2017-02-10 MED ORDER — ONDANSETRON HCL 4 MG PO TABS: 4.0000 mg | ORAL_TABLET | Freq: Four times a day (QID) | ORAL | Status: DC | PRN

## 2017-02-10 MED ORDER — METOPROLOL SUCCINATE ER 50 MG PO TB24
100.0000 mg | ORAL_TABLET | Freq: Every day | ORAL | Status: DC
  Administered 2017-02-11 – 2017-02-12 (×2): 100 mg via ORAL
  Filled 2017-02-10 (×2): qty 2

## 2017-02-10 MED ORDER — SENNA 8.6 MG PO TABS: 1.0000 | ORAL_TABLET | Freq: Every day | ORAL | Status: DC | PRN

## 2017-02-10 MED ORDER — PIPERACILLIN-TAZOBACTAM 3.375 G IVPB
3.3750 g | Freq: Three times a day (TID) | INTRAVENOUS | Status: DC
  Administered 2017-02-10 – 2017-02-12 (×5): 3.375 g via INTRAVENOUS
  Filled 2017-02-10 (×6): qty 50

## 2017-02-10 MED ORDER — SODIUM CHLORIDE 0.9 % IV SOLN
INTRAVENOUS | Status: DC
Start: 2017-02-10 — End: 2017-02-12
  Administered 2017-02-11: 13:00:00 via INTRAVENOUS

## 2017-02-10 MED ORDER — HEPARIN SODIUM (PORCINE) 5000 UNIT/ML IJ SOLN
5000.0000 [IU] | Freq: Three times a day (TID) | INTRAMUSCULAR | Status: DC
  Administered 2017-02-10 – 2017-02-12 (×4): 5000 [IU] via SUBCUTANEOUS
  Filled 2017-02-10 (×5): qty 1

## 2017-02-10 MED ORDER — SORBITOL 70 % SOLN: 30.0000 mL | Freq: Every day | Status: DC | PRN

## 2017-02-10 MED ORDER — DILTIAZEM HCL ER COATED BEADS 300 MG PO CP24
300.0000 mg | ORAL_CAPSULE | Freq: Every day | ORAL | Status: DC
  Administered 2017-02-11 – 2017-02-12 (×2): 300 mg via ORAL
  Filled 2017-02-10 (×2): qty 1

## 2017-02-10 MED ORDER — FLEET ENEMA 7-19 GM/118ML RE ENEM: 1.0000 | ENEMA | Freq: Once | RECTAL | Status: DC | PRN

## 2017-02-10 MED ORDER — INSULIN ASPART 100 UNIT/ML ~~LOC~~ SOLN
0.0000 [IU] | Freq: Three times a day (TID) | SUBCUTANEOUS | Status: DC
  Administered 2017-02-11: 1 [IU] via SUBCUTANEOUS
  Administered 2017-02-11: 2 [IU] via SUBCUTANEOUS
  Administered 2017-02-12 (×2): 1 [IU] via SUBCUTANEOUS
  Filled 2017-02-10 (×4): qty 1

## 2017-02-10 MED ORDER — PIPERACILLIN-TAZOBACTAM 3.375 G IVPB 30 MIN
3.3750 g | Freq: Once | INTRAVENOUS | Status: AC
  Administered 2017-02-10: 3.375 g via INTRAVENOUS

## 2017-02-10 MED ORDER — SODIUM CHLORIDE 0.9 % IV SOLN: 4.0000 mg | Freq: Four times a day (QID) | INTRAVENOUS | Status: DC | PRN

## 2017-02-10 MED ORDER — DOCUSATE SODIUM 50 MG PO CAPS: 100.0000 mg | ORAL_CAPSULE | Freq: Two times a day (BID) | ORAL | Status: DC

## 2017-02-10 MED ORDER — FUROSEMIDE 40 MG PO TABS
20.0000 mg | ORAL_TABLET | Freq: Every day | ORAL | Status: DC
Start: 2017-02-11 — End: 2017-02-12
  Administered 2017-02-11 – 2017-02-12 (×2): 20 mg via ORAL
  Filled 2017-02-10 (×2): qty 1

## 2017-02-10 MED ORDER — ACETAMINOPHEN 650 MG RE SUPP: 650.0000 mg | Freq: Four times a day (QID) | RECTAL | Status: DC | PRN

## 2017-02-10 MED ORDER — SODIUM CHLORIDE 0.9 % IV SOLN: INTRAVENOUS | Status: DC

## 2017-02-10 MED ORDER — METFORMIN HCL 500 MG PO TABS
1000.0000 mg | ORAL_TABLET | Freq: Two times a day (BID) | ORAL | Status: DC
  Filled 2017-02-10: qty 2

## 2017-02-10 MED ORDER — DIGOXIN 125 MCG PO TABS
0.1250 mg | ORAL_TABLET | Freq: Every day | ORAL | Status: DC
  Administered 2017-02-11 – 2017-02-12 (×2): 0.125 mg via ORAL
  Filled 2017-02-10 (×2): qty 1

## 2017-02-10 MED ORDER — PIPERACILLIN SOD-TAZOBACTAM SO 2.25 (2-0.25) G IV SOLR: 3.3750 g | Freq: Four times a day (QID) | INTRAVENOUS | Status: DC

## 2017-02-10 MED ORDER — OXYCODONE HCL ER 10 MG PO T12A
10.0000 mg | EXTENDED_RELEASE_TABLET | Freq: Two times a day (BID) | ORAL | Status: DC
  Administered 2017-02-11 – 2017-02-12 (×2): 10 mg via ORAL
  Filled 2017-02-10 (×5): qty 1

## 2017-02-10 MED ORDER — INSULIN ASPART 100 UNIT/ML ~~LOC~~ SOLN: 70.0000 [IU] | Freq: Three times a day (TID) | SUBCUTANEOUS | Status: DC

## 2017-02-10 MED ORDER — DOCUSATE SODIUM 100 MG PO CAPS
100.0000 mg | ORAL_CAPSULE | Freq: Two times a day (BID) | ORAL | Status: DC
  Administered 2017-02-10 – 2017-02-11 (×2): 100 mg via ORAL
  Filled 2017-02-10 (×5): qty 1

## 2017-02-10 MED ORDER — MAGNESIUM HYDROXIDE 400 MG/5ML PO SUSP: 30.0000 mL | Freq: Every day | ORAL | Status: DC | PRN

## 2017-02-10 MED ORDER — ATORVASTATIN CALCIUM 20 MG PO TABS
80.0000 mg | ORAL_TABLET | Freq: Every day | ORAL | Status: DC
  Administered 2017-02-11: 80 mg via ORAL
  Filled 2017-02-10: qty 4

## 2017-02-10 MED ORDER — ACETAMINOPHEN 325 MG PO TABS: 650.0000 mg | ORAL_TABLET | Freq: Four times a day (QID) | ORAL | Status: DC | PRN

## 2017-02-10 MED ORDER — DOCUSATE SODIUM 100 MG PO CAPS: 100.0000 mg | ORAL_CAPSULE | Freq: Two times a day (BID) | ORAL | Status: DC

## 2017-02-10 NOTE — Telephone Encounter (Signed)
He may need to be admitted for IV ABX and possible debridement. Recommend evaluation in the ED.

## 2017-02-10 NOTE — ED Notes (Signed)
Wound culture obtained by Dr Quentin Cornwall

## 2017-02-10 NOTE — Telephone Encounter (Signed)
I spoke with Jeani Hawking and let her know the PA's recommendations. See notes below.

## 2017-02-10 NOTE — Telephone Encounter (Signed)
Patient's wife called wanting a note to say the patient can have his physical therapy increased. I ask if she could have the facility to fax over a letter for the doctor to sign, she stated she would ask them to do so.

## 2017-02-10 NOTE — ED Provider Notes (Signed)
[facility] Emergency Department Provider Note    First MD Initiated Contact with Patient 02/10/17 1452     (approximate)  I have reviewed the triage vital signs and the nursing notes.   HISTORY  Chief Complaint Wound Infection    HPI [name] is a 82 y.o. male status post left BKA with concern for infected stump wound. No report of fevers. Patient otherwise feels fine. Also has noted pain and rash to the sacrum. Does not reveal on any antibiotics. No trauma. No pain. No cough. No shortness of breath.   No recent changes to meds.   Past Medical History:  Diagnosis Date  . Arthritis   . Atrial fibrillation ([place])   . Carcinoma of prostate ([place])   . CHF (congestive heart failure) ([place])   . Chronic kidney disease   . Diabetes mellitus without complication ([place])   . ED (erectile dysfunction)   . Frequent urination   . Hyperlipidemia   . Hypertension   . Moderate mitral insufficiency   . Peripheral vascular disease ([place])   . Pneumonia 04/2016  . Prostate cancer ([place])   . Sleep apnea    OSA--USE C-PAP  . Ulcer of left foot due to type 2 diabetes mellitus ([place])   . Urinary stress incontinence, male    Family History  Problem Relation Age of Onset  . Hypertension Mother   . Diabetes Mother   . Prostate cancer Neg Hx   . Bladder Cancer Neg Hx   . Kidney disease Neg Hx    Past Surgical History:  Procedure Laterality Date  . AMPUTATION Left 01/12/2017   Procedure: AMPUTATION BELOW KNEE;  Surgeon: Algernon Huxley, MD;  Location: [facility] ORS;  Service: General;  Laterality: Left;  . APLIGRAFT PLACEMENT Left 10/15/2016   Procedure: APLIGRAFT PLACEMENT;  Surgeon: Albertine Patricia, DPM;  Location: [facility] OR[facility];  Service: Podiatry;  Laterality: Left;  necrotic ulcer  . CHOLECYSTECTOMY    . EYE SURGERY Bilateral    Cataract Extraction with IOL  . IRRIGATION AND DEBRIDEMENT FOOT Left 10/15/2016   Procedure: IRRIGATION AND DEBRIDEMENT FOOT-EXCISIONAL  DEBRIDEMENT OF SKIN 3RD, 4TH AND 5TH TOES WITH APPLICATION OF APLIGRAFT;  Surgeon: Albertine Patricia, DPM;  Location: [facility] OR[facility];  Service: Podiatry;  Laterality: Left;  necrotic,gangrene  . IRRIGATION AND DEBRIDEMENT FOOT Left 12/09/2016   Procedure: IRRIGATION AND DEBRIDEMENT FOOT-MUSCLE/FASCIA, RESECTION OF FOURTH AND FIFTH METATARSAL NECROTIC BONE;  Surgeon: Albertine Patricia, DPM;  Location: [facility] OR[facility];  Service: Podiatry;  Laterality: Left;  . LOWER EXTREMITY ANGIOGRAPHY Left 08/16/2016   Procedure: Lower Extremity Angiography;  Surgeon: Algernon Huxley, MD;  Location: [place] [facility];  Service: Cardiovascular;  Laterality: Left;  . LOWER EXTREMITY ANGIOGRAPHY Left 09/01/2016   Procedure: Lower Extremity Angiography;  Surgeon: Algernon Huxley, MD;  Location: [place] [facility];  Service: Cardiovascular;  Laterality: Left;  . LOWER EXTREMITY ANGIOGRAPHY Left 10/14/2016   Procedure: Lower Extremity Angiography;  Surgeon: Algernon Huxley, MD;  Location: [place] [facility];  Service: Cardiovascular;  Laterality: Left;  . LOWER EXTREMITY ANGIOGRAPHY Left 12/20/2016   Procedure: Lower Extremity Angiography;  Surgeon: Algernon Huxley, MD;  Location: [place] [facility];  Service: Cardiovascular;  Laterality: Left;  . LOWER EXTREMITY INTERVENTION  09/01/2016   Procedure: Lower Extremity Intervention;  Surgeon: Algernon Huxley, MD;  Location: [place] [facility];  Service: Cardiovascular;;  . PROSTATE SURGERY     removal  . TONSILLECTOMY  as a child   Patient Active Problem List   Diagnosis Date Noted  . Delayed surgical wound healing of below-the-knee amputation stump ([place]) 02/10/2017  . S/P BKA (below knee amputation) unilateral, left ([place]) 02/01/2017  . Chronic diastolic heart failure ([place]) 01/21/2017  . Pressure injury of skin 01/18/2017  . 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 Diabetic foot ulcer ([place]) 12/29/2016  . Ataxia 10/21/2016  . History of femoral angiogram  09/06/2016  . Left leg pain 09/06/2016  . Leukocytosis 09/06/2016  . Generalized weakness 09/06/2016  . A-fib ([place]) 08/30/2016  . Pneumonia 05/19/2016  . Hyperlipidemia 04/20/2016  . Back pain 04/19/2016  . Type 2 diabetes mellitus treated with insulin ([place]) 04/19/2016  . Essential hypertension 04/19/2016  . PAD (peripheral artery disease) ([place]) 04/19/2016  . Erectile dysfunction following radical prostatectomy 06/25/2015  . History of prostate cancer 12/23/2014  . Incontinence 12/23/2014      Prior to Admission medications   Medication Sig Start Date End Date Taking? Authorizing Provider  atorvastatin (LIPITOR) 80 MG tablet Take 80 mg by mouth daily at 6 PM.    Yes [provider]  digoxin (LANOXIN) 0.125 MG tablet Take 1 tablet (0.125 mg total) by mouth daily. 01/20/17  Yes Henreitta Leber, MD  diltiazem (CARDIZEM CD) 300 MG 24 hr capsule Take 300 mg by mouth daily.   Yes [provider]  furosemide (LASIX) 20 MG tablet TAKE 1 TABLET DAILY 10/24/15  Yes [provider]  insulin aspart (NOVOLOG) 100 UNIT/ML injection Inject 70-120 Units into the skin 3 (three) times daily with meals.   Yes [provider]  insulin glargine (LANTUS) 100 UNIT/ML injection Inject 0.1 mLs (10 Units total) into the skin at bedtime. 01/19/17  Yes Henreitta Leber, MD  metFORMIN (GLUCOPHAGE) 1000 MG tablet TAKE 1 TABLET TWICE A DAY WITH MEALS 11/28/15  Yes [provider]  oxyCODONE (OXYCONTIN) 10 mg 12 hr tablet Take 1 tablet (10 mg total) by mouth every 12 (twelve) hours. 01/19/17  Yes Sainani, Belia Heman, MD  TOPROL XL 100 MG 24 hr tablet Take 100 mg by mouth daily. 12/25/16  Yes [provider]  TRULICITY 9.51 OA/4.1YS SOPN 0.75 mg once a week. 11/14/16  Yes [provider]  oxyCODONE (OXY IR/ROXICODONE) 5 MG immediate release tablet Take 1 tablet (5 mg total) by mouth every 6 (six) hours as needed for moderate pain. 01/19/17   Henreitta Leber, MD     Allergies Contrast media [iodinated diagnostic agents]; Iohexol; and Latex    Social History Social History  Substance Use Topics  . Smoking status: Former Smoker    Packs/day: 0.25    Types: Cigarettes    Quit date: 10/14/1994  . Smokeless tobacco: Never Used  . Alcohol use No    Review of Systems Patient denies headaches, rhinorrhea, blurry vision, numbness, shortness of breath, chest pain, edema, cough, abdominal pain, nausea, vomiting, diarrhea, dysuria, fevers, rashes or hallucinations unless otherwise stated above in HPI. ____________________________________________   PHYSICAL EXAM:  VITAL SIGNS: Vitals:   02/10/17 1456  BP: (!) 102/59  Pulse: (!) 58  Resp: 16  Temp: 97.8 F (36.6 C)  SpO2: 97%    Constitutional: Alert and oriented. Well appearing and in no acute distress. Eyes: Conjunctivae are normal.  Head: Atraumatic. Nose: No congestion/rhinnorhea. Mouth/Throat: Mucous membranes are moist.   Neck: No stridor. Painless ROM.  Cardiovascular: Normal rate, regular rhythm. Grossly normal heart sounds.  Good peripheral  circulation. Respiratory: Normal respiratory effort.  No retractions. Lungs CTAB. Gastrointestinal: Soft and nontender. No distention. No abdominal bruits. No CVA tenderness. Musculoskeletal: s/p left bka with no erythema or purulent drainage but large area of necrotic appearing scar along staple line Neurologic:  Normal speech and language. No gross focal neurologic deficits are appreciated. No facial droop Skin:  Skin is warm, dry and intact. Stage 1 decubitus ulcer Psychiatric: Mood and affect are normal. Speech and behavior are normal.  ____________________________________________   LABS (all labs ordered are listed, but only abnormal results are displayed)  Results for orders placed or performed during the hospital encounter of 02/10/17 (from the past 24 hour(s))  CBC with Differential/Platelet     Status: Abnormal   Collection  Time: 02/10/17  3:11 PM  Result Value Ref Range   WBC 12.6 (H) 3.8 - 10.6 K/uL   RBC 3.84 (L) 4.40 - 5.90 MIL/uL   Hemoglobin 10.4 (L) 13.0 - 18.0 g/dL   HCT 31.5 (L) 40.0 - 52.0 %   MCV 82.0 80.0 - 100.0 fL   MCH 27.1 26.0 - 34.0 pg   MCHC 33.0 32.0 - 36.0 g/dL   RDW 16.9 (H) 11.5 - 14.5 %   Platelets 357 150 - 440 K/uL   Neutrophils Relative % 77 %   Neutro Abs 9.9 (H) 1.4 - 6.5 K/uL   Lymphocytes Relative 10 %   Lymphs Abs 1.2 1.0 - 3.6 K/uL   Monocytes Relative 7 %   Monocytes Absolute 0.8 0.2 - 1.0 K/uL   Eosinophils Relative 5 %   Eosinophils Absolute 0.6 0 - 0.7 K/uL   Basophils Relative 1 %   Basophils Absolute 0.1 0 - 0.1 K/uL  Comprehensive metabolic panel     Status: Abnormal   Collection Time: 02/10/17  3:11 PM  Result Value Ref Range   Sodium 136 135 - 145 mmol/L   Potassium 4.5 3.5 - 5.1 mmol/L   Chloride 99 (L) 101 - 111 mmol/L   CO2 26 22 - 32 mmol/L   Glucose, Bld 244 (H) 65 - 99 mg/dL   BUN 19 6 - 20 mg/dL   Creatinine, Ser 0.85 0.61 - 1.24 mg/dL   Calcium 8.2 (L) 8.9 - 10.3 mg/dL   Total Protein 6.1 (L) 6.5 - 8.1 g/dL   Albumin 2.6 (L) 3.5 - 5.0 g/dL   AST 34 15 - 41 U/L   ALT 27 17 - 63 U/L   Alkaline Phosphatase 213 (H) 38 - 126 U/L   Total Bilirubin 0.5 0.3 - 1.2 mg/dL   GFR calc non Af Amer >60 >60 mL/min   GFR calc Af Amer >60 >60 mL/min   Anion gap 11 5 - 15  Protime-INR     Status: Abnormal   Collection Time: 02/10/17  3:11 PM  Result Value Ref Range   Prothrombin Time 17.4 (H) 11.4 - 15.2 seconds   INR 1.44    ____________________________________________ ____________________________________________  RADIOLOGY   ____________________________________________   PROCEDURES  Procedure(s) performed:  Procedures    Critical Care performed: no ____________________________________________   INITIAL IMPRESSION / ASSESSMENT AND PLAN / ED COURSE  Pertinent labs & imaging results that were available during my care of the patient were  reviewed by me and considered in my medical decision making (see chart for details).  DDX: wound infection, cellulitis, abscess, ischemic limb  KWAN SHELLHAMMER is a 82 y.o. who presents to the ED with drainage from recent BKA with necrotic-appearing tissue around the  staple line. Patient is not septic or acutely ill appearing but would expect for better wound healing at this point. A spoke with Dr. snare of acid surgery regarding the patient's presentation and given his comorbidities and gangrenous-appearing wound will start the patient on IV antibiotics and admit for further evaluation and management.      ____________________________________________   FINAL CLINICAL IMPRESSION(S) / ED DIAGNOSES  Final diagnoses:  Wound infection  S/P BKA (below knee amputation) unilateral, left ([place])      NEW MEDICATIONS STARTED DURING THIS VISIT:  New Prescriptions   No medications on file     Note:  This document was prepared using Dragon voice recognition software and may include unintentional dictation errors.    Merlyn Lot, MD 02/10/17 [phone]

## 2017-02-10 NOTE — H&P (Signed)
[facility] Admission History & Physical  MRN : [id number]  [name] is a 82 y.o. (10/08/1934) male who presents with chief complaint of  Chief Complaint  Patient presents with  . Wound Infection  .  History of Present Illness: Patient is sent to the emergency room today by [facility] rehabilitation with concerns regarding the left below-knee amputation stump. There is a report of drainage. There is no documentation of any fever or chills while at rehabilitation. Patient denies fever or chills. When asked about pain he states it is sore but he has not been experiencing any increased pain. Currently his pain control is well managed.    Current Facility-Administered Medications  Medication Dose Route Frequency Provider Last Rate Last Dose  . 0.9 %  sodium chloride infusion   Intravenous Continuous Stegmayer, Kimberly A, PA-C      . 0.9 %  sodium chloride infusion   Intravenous Continuous Schnier, Dolores Lory, MD      . Derrill Memo ON 02/11/2017] atorvastatin (LIPITOR) tablet 80 mg  80 mg Oral q1800 Schnier, Dolores Lory, MD      . digoxin (LANOXIN) tablet 0.125 mg  0.125 mg Oral Daily Schnier, Dolores Lory, MD      . diltiazem (CARDIZEM CD) 24 hr capsule 300 mg  300 mg Oral Daily Schnier, Dolores Lory, MD      . docusate sodium (COLACE) capsule 100 mg  100 mg Oral BID Stegmayer, Kimberly A, PA-C      . docusate sodium (COLACE) capsule 100 mg  100 mg Oral BID Schnier, Dolores Lory, MD      . furosemide (LASIX) tablet 20 mg  20 mg Oral Daily Schnier, Dolores Lory, MD      . heparin injection 5,000 Units  5,000 Units Subcutaneous Q8H Stegmayer, Kimberly A, PA-C      . heparin injection 5,000 Units  5,000 Units Subcutaneous Q8H Schnier, Dolores Lory, MD      . Derrill Memo ON 02/11/2017] insulin aspart (novoLOG) injection 70-120 Units  70-120 Units Subcutaneous TID WC Schnier, Dolores Lory, MD      . insulin glargine (LANTUS) injection 10 Units  10 Units Subcutaneous QHS Schnier, Dolores Lory, MD       . Derrill Memo ON 02/11/2017] metFORMIN (GLUCOPHAGE) tablet 1,000 mg  1,000 mg Oral BID WC Schnier, Dolores Lory, MD      . metoprolol succinate (TOPROL-XL) 24 hr tablet 100 mg  100 mg Oral Daily Schnier, Dolores Lory, MD      . oxyCODONE (Oxy IR/ROXICODONE) immediate release tablet 5 mg  5 mg Oral Q6H PRN Schnier, Dolores Lory, MD      . oxyCODONE (OXYCONTIN) 12 hr tablet 10 mg  10 mg Oral Q12H Schnier, Dolores Lory, MD      . piperacillin-tazobactam (ZOSYN) 3.375 g in dextrose 5 % 50 mL IVPB  3.375 g Intravenous Q6H Stegmayer, Kimberly A, PA-C      . piperacillin-tazobactam (ZOSYN) IVPB 3.375 g  3.375 g Intravenous Q6H Schnier, Dolores Lory, MD      . senna (SENOKOT) tablet 8.6 mg  1 tablet Oral Daily PRN Stegmayer, Janalyn Harder, PA-C      . senna (SENOKOT) tablet 8.6 mg  1 tablet Oral Daily PRN Schnier, Dolores Lory, MD       Current Outpatient Prescriptions  Medication Sig Dispense Refill  . atorvastatin (LIPITOR) 80 MG tablet Take 80 mg by mouth daily at 6 PM.     . digoxin (LANOXIN) 0.125 MG tablet  Take 1 tablet (0.125 mg total) by mouth daily.    Marland Kitchen diltiazem (CARDIZEM CD) 300 MG 24 hr capsule Take 300 mg by mouth daily.    . furosemide (LASIX) 20 MG tablet TAKE 1 TABLET DAILY    . insulin aspart (NOVOLOG) 100 UNIT/ML injection Inject 70-120 Units into the skin 3 (three) times daily with meals.    . insulin glargine (LANTUS) 100 UNIT/ML injection Inject 0.1 mLs (10 Units total) into the skin at bedtime. 10 mL 11  . metFORMIN (GLUCOPHAGE) 1000 MG tablet TAKE 1 TABLET TWICE A DAY WITH MEALS    . oxyCODONE (OXYCONTIN) 10 mg 12 hr tablet Take 1 tablet (10 mg total) by mouth every 12 (twelve) hours. 14 tablet 0  . TOPROL XL 100 MG 24 hr tablet Take 100 mg by mouth daily.    . TRULICITY 8.41 YS/0.6TK SOPN 0.75 mg once a week.    Marland Kitchen oxyCODONE (OXY IR/ROXICODONE) 5 MG immediate release tablet Take 1 tablet (5 mg total) by mouth every 6 (six) hours as needed for moderate pain. 30 tablet 0   Facility-Administered  Medications Ordered in Other Encounters  Medication Dose Route Frequency Provider Last Rate Last Dose  . acetaminophen (TYLENOL) tablet 650 mg  650 mg Oral Q6H PRN Stegmayer, Kimberly A, PA-C       Or  . acetaminophen (TYLENOL) suppository 650 mg  650 mg Rectal Q6H PRN Stegmayer, Kimberly A, PA-C      . ondansetron (ZOFRAN) tablet 4 mg  4 mg Oral Q6H PRN Stegmayer, Kimberly A, PA-C       Or  . ondansetron (ZOFRAN) 4 mg in sodium chloride 0.9 % 50 mL IVPB  4 mg Intravenous Q6H PRN Stegmayer, Janalyn Harder, PA-C        Past Medical History:  Diagnosis Date  . Arthritis   . Atrial fibrillation ([place])   . Carcinoma of prostate ([place])   . CHF (congestive heart failure) ([place])   . Chronic kidney disease   . Diabetes mellitus without complication ([place])   . ED (erectile dysfunction)   . Frequent urination   . Hyperlipidemia   . Hypertension   . Moderate mitral insufficiency   . Peripheral vascular disease ([place])   . Pneumonia 04/2016  . Prostate cancer ([place])   . Sleep apnea    OSA--USE C-PAP  . Ulcer of left foot due to type 2 diabetes mellitus ([place])   . Urinary stress incontinence, male     Past Surgical History:  Procedure Laterality Date  . AMPUTATION Left 01/12/2017   Procedure: AMPUTATION BELOW KNEE;  Surgeon: Algernon Huxley, MD;  Location: [facility];  Service: General;  Laterality: Left;  . APLIGRAFT PLACEMENT Left 10/15/2016   Procedure: APLIGRAFT PLACEMENT;  Surgeon: Albertine Patricia, DPM;  Location: [facility];  Service: Podiatry;  Laterality: Left;  necrotic ulcer  . CHOLECYSTECTOMY    . EYE SURGERY Bilateral    Cataract Extraction with IOL  . IRRIGATION AND DEBRIDEMENT FOOT Left 10/15/2016   Procedure: IRRIGATION AND DEBRIDEMENT FOOT-EXCISIONAL DEBRIDEMENT OF SKIN 3RD, 4TH AND 5TH TOES WITH APPLICATION OF APLIGRAFT;  Surgeon: Albertine Patricia, DPM;  Location: [facility];  Service: Podiatry;  Laterality: Left;  necrotic,gangrene  . IRRIGATION AND DEBRIDEMENT FOOT Left 12/09/2016    Procedure: IRRIGATION AND DEBRIDEMENT FOOT-MUSCLE/FASCIA, RESECTION OF FOURTH AND FIFTH METATARSAL NECROTIC BONE;  Surgeon: Albertine Patricia, DPM;  Location: [facility];  Service: Podiatry;  Laterality: Left;  . LOWER EXTREMITY ANGIOGRAPHY Left 08/16/2016   Procedure: Lower  Extremity Angiography;  Surgeon: Algernon Huxley, MD;  Location: [place] [facility];  Service: Cardiovascular;  Laterality: Left;  . LOWER EXTREMITY ANGIOGRAPHY Left 09/01/2016   Procedure: Lower Extremity Angiography;  Surgeon: Algernon Huxley, MD;  Location: [place] [facility];  Service: Cardiovascular;  Laterality: Left;  . LOWER EXTREMITY ANGIOGRAPHY Left 10/14/2016   Procedure: Lower Extremity Angiography;  Surgeon: Algernon Huxley, MD;  Location: [place] [facility];  Service: Cardiovascular;  Laterality: Left;  . LOWER EXTREMITY ANGIOGRAPHY Left 12/20/2016   Procedure: Lower Extremity Angiography;  Surgeon: Algernon Huxley, MD;  Location: [place] [facility];  Service: Cardiovascular;  Laterality: Left;  . LOWER EXTREMITY INTERVENTION  09/01/2016   Procedure: Lower Extremity Intervention;  Surgeon: Algernon Huxley, MD;  Location: [place] [facility];  Service: Cardiovascular;;  . PROSTATE SURGERY     removal  . TONSILLECTOMY     as a child    Social History Social History  Substance Use Topics  . Smoking status: Former Smoker    Packs/day: 0.25    Types: Cigarettes    Quit date: 10/14/1994  . Smokeless tobacco: Never Used  . Alcohol use No    Family History Family History  Problem Relation Age of Onset  . Hypertension Mother   . Diabetes Mother   . Prostate cancer Neg Hx   . Bladder Cancer Neg Hx   . Kidney disease Neg Hx   No family history of bleeding/clotting disorders, porphyria or autoimmune disease   Allergies  Allergen Reactions  . Contrast Media [Iodinated Diagnostic Agents] Shortness Of Breath    SOB  . Iohexol Shortness Of Breath     Desc: Respiratory Distress, Laryngedema, diaphoresis, Onset Date:  [id number]   . Latex Rash     REVIEW OF SYSTEMS (Negative unless checked)  Constitutional: [] Weight loss  [] Fever  [] Chills Cardiac: [] Chest pain   [] Chest pressure   [] Palpitations   [] Shortness of breath when laying flat   [] Shortness of breath at rest   [] Shortness of breath with exertion. Vascular:  [x] Pain in legs with walking   [x] Pain in legs at rest   [] Pain in legs when laying flat   [] Claudication   [] Pain in feet when walking  [] Pain in feet at rest  [] Pain in feet when laying flat   [] History of DVT   [] Phlebitis   [] Swelling in legs   [] Varicose veins   [] Non-healing ulcers Pulmonary:   [] Uses home oxygen   [] Productive cough   [] Hemoptysis   [] Wheeze  [] COPD   [] Asthma Neurologic:  [] Dizziness  [] Blackouts   [] Seizures   [] History of stroke   [] History of TIA  [] Aphasia   [] Temporary blindness   [] Dysphagia   [] Weakness or numbness in arms   [] Weakness or numbness in legs Musculoskeletal:  [] Arthritis   [] Joint swelling   [] Joint pain   [] Low back pain Hematologic:  [] Easy bruising  [] Easy bleeding   [] Hypercoagulable state   [] Anemic  [] Hepatitis Gastrointestinal:  [] Blood in stool   [] Vomiting blood  [] Gastroesophageal reflux/heartburn   [] Difficulty swallowing. Genitourinary:  [] Chronic kidney disease   [] Difficult urination  [] Frequent urination  [] Burning with urination   [] Blood in urine Skin:  [] Rashes   [] Ulcers   [] Wounds Psychological:  [] History of anxiety   []  History of major depression.  Physical Examination  Vitals:   02/10/17 1700 02/10/17 1730 02/10/17 1800 02/10/17 1830  BP: (!) 116/58 (!) 108/58 114/60 119/64  Pulse: (!) 52 (!) 54 Marland Kitchen)  52 (!) 49  Resp:   16   Temp:      TempSrc:      SpO2: 97% 96% 95% 95%  Weight:      Height:       Body mass index is 28.98 kg/m. Gen: WD/WN, NAD Head: [place]/AT, No temporalis wasting. Prominent temp pulse not noted. Ear/Nose/Throat: Hearing grossly intact, nares w/o erythema or drainage, oropharynx w/o Erythema/Exudate,   Eyes: Conjunctiva clear, sclera non-icteric Neck: Trachea midline.  No JVD.  Pulmonary:  Good air movement, respirations not labored, no use of accessory muscles.  Cardiac: RRR, normal S1, S2. Vascular: Left below-knee amputation stump demonstrates an area medially with dried eschar. Staples are within this. Laterally staples are intact as is the suture line. There is minimal surrounding erythema. There is no induration there is no warmth. He is holding his knee straight.  Gastrointestinal: soft, non-tender/non-distended. No guarding/reflex.  Musculoskeletal: M/S 5/5 throughout.  Extremities without ischemic changes.  No deformity or atrophy.  Neurologic: Sensation grossly intact in extremities.  Symmetrical.  Speech is fluent. Motor exam as listed above. Psychiatric: Judgment intact, Mood & affect appropriate for pt's clinical situation. Dermatologic: No rashes or ulcers noted.  No cellulitis or open wounds. Lymph : No Cervical, Axillary, or Inguinal lymphadenopathy.     CBC Lab Results  Component Value Date   WBC 12.6 (H) 02/10/2017   HGB 10.4 (L) 02/10/2017   HCT 31.5 (L) 02/10/2017   MCV 82.0 02/10/2017   PLT 357 02/10/2017    BMET    Component Value Date/Time   NA 136 02/10/2017 1511   NA 140 01/20/2013 0255   K 4.5 02/10/2017 1511   K 3.5 01/20/2013 0255   CL 99 (L) 02/10/2017 1511   CL 103 01/20/2013 0255   CO2 26 02/10/2017 1511   CO2 30 01/20/2013 0255   GLUCOSE 244 (H) 02/10/2017 1511   GLUCOSE 145 (H) 01/20/2013 0255   BUN 19 02/10/2017 1511   BUN 22 (H) 01/20/2013 0255   CREATININE 0.85 02/10/2017 1511   CREATININE 1.09 01/20/2013 0255   CALCIUM 8.2 (L) 02/10/2017 1511   CALCIUM 9.4 01/20/2013 0255   GFRNONAA >60 02/10/2017 1511   GFRNONAA >60 01/20/2013 0255   GFRAA >60 02/10/2017 1511   GFRAA >60 01/20/2013 0255   Estimated Creatinine Clearance: 73 mL/min (by C-G formula based on SCr of 0.85 mg/dL).  COAG Lab Results  Component Value Date   INR  1.44 02/10/2017   INR 4.17 (HH) 01/27/2017   INR 2.45 01/19/2017    Radiology Dg Chest Port 1 View  Result Date: 01/12/2017 CLINICAL DATA:  Hypoxia. History of CHF, diabetes, atrial fibrillation, prostate malignancy, former smoker. EXAM: PORTABLE CHEST 1 VIEW COMPARISON:  PA and lateral chest x-ray of January 08, 2017 FINDINGS: The lungs are adequately inflated. The lung markings remain coarse in the left retrocardiac region. The cardiac silhouette remains enlarged. The pulmonary vascularity remains engorged. The interstitial markings are less conspicuous today. There is calcification in the thoracic aortic arch. The observed bony thorax is unremarkable. IMPRESSION: Mild interval improvement in pulmonary interstitial edema secondary to CHF. Persistent left lower lobe atelectasis or pneumonia. A small left pleural effusion is suspected. Thoracic aortic atherosclerosis. Electronically Signed   By: David  Martinique M.D.   On: 01/12/2017 07:24     Assessment/Plan 1. Below-knee amputation wound complication: I am not convinced that the patient has a significant stump infection. There are minimal signs of infection associated with the dried eschar. However,  given the history of drainage and the mild leukocytosis it seems reasonable to bring him into the hospital and initiate antibiotic therapy at this time. I will continue to evaluate the stump incision and have informed the patient that he may require surgical debridement. At this time, the eschar appears superficial there is no fluctuance or erythema or induration suggesting a deep abscess. Observation would be warranted.   I have asked medicine to follow given his medical comorbidities  Hortencia Pilar, MD  02/10/2017 6:51 PM

## 2017-02-10 NOTE — ED Triage Notes (Addendum)
Patient from [facility] via [place]. Patient reports having a BKA 3 weeks ago. States he was told by the wound care nurse to come in to be checked for possible infection. Surgical site dressing has yellow and red drainage to it. Patient also complaining of painful rash to backside. States he has been putting ointment on it as directed. Patient alert and oriented.

## 2017-02-10 NOTE — Progress Notes (Signed)
Pharmacist - Prescriber Communication  Per protocol the Zosyn order has been changed from Q6H to [facility] over 240 minutes (extended interval dosing).   Naomii Kreger A. [place], [place].D., BCPS Clinical Pharmacist 02/10/17 20:37

## 2017-02-10 NOTE — Telephone Encounter (Signed)
[name] from the [facility] the patient resides at states the patient's left leg amputation has purulent drainage , foul odor and warm to the touch.

## 2017-02-11 ENCOUNTER — Ambulatory Visit: Payer: Medicare Other | Admitting: Surgery

## 2017-02-11 LAB — GLUCOSE, CAPILLARY
GLUCOSE-CAPILLARY: 95 mg/dL (ref 65–99)
GLUCOSE-CAPILLARY: 140 mg/dL — AB (ref 65–99)
Glucose-Capillary: 161 mg/dL — ABNORMAL HIGH (ref 65–99)
Glucose-Capillary: 189 mg/dL — ABNORMAL HIGH (ref 65–99)

## 2017-02-11 LAB — CBC
HCT: 30.9 % — ABNORMAL LOW (ref 40.0–52.0)
HEMOGLOBIN: 10.5 g/dL — AB (ref 13.0–18.0)
MCH: 27.6 pg (ref 26.0–34.0)
MCHC: 34 g/dL (ref 32.0–36.0)
MCV: 81.3 fL (ref 80.0–100.0)
PLATELETS: 285 10*3/uL (ref 150–440)
RBC: 3.81 MIL/uL — AB (ref 4.40–5.90)
RDW: 16.9 % — ABNORMAL HIGH (ref 11.5–14.5)
WBC: 9.8 10*3/uL (ref 3.8–10.6)

## 2017-02-11 MED ORDER — HYDRALAZINE HCL 20 MG/ML IJ SOLN: 10.0000 mg | Freq: Four times a day (QID) | INTRAMUSCULAR | Status: DC | PRN

## 2017-02-11 MED ORDER — INSULIN GLARGINE 100 UNIT/ML ~~LOC~~ SOLN
8.0000 [IU] | Freq: Every day | SUBCUTANEOUS | Status: DC
  Filled 2017-02-11 (×2): qty 0.08

## 2017-02-11 NOTE — Progress Notes (Signed)
[facility]  Daily Progress Note   Subjective  - * No surgery found *  Patient states he is having less pain. He wishes to be discharged  Objective Vitals:   02/11/17 0445 02/11/17 0500 02/11/17 0909 02/11/17 1324  BP: 125/60  (!) 159/78 112/66  Pulse: (!) 120  74 79  Resp:    14  Temp: 98.6 F (37 C)  98.1 F (36.7 C) 98.6 F (37 C)  TempSrc: Oral  Oral Oral  SpO2: 94%  97% 97%  Weight:  79.8 kg (176 lb)    Height:        Intake/Output Summary (Last 24 hours) at 02/11/17 1906 Last data filed at 02/11/17 1500  Gross per 24 hour  Intake              535 ml  Output             1100 ml  Net             -565 ml    PULM  Normal effort , no use of accessory muscles CV  No JVD, RRR Abd      No distended, nontender VASC  Left below-knee of dictation stump appears less edematous eschars stable there is no drainage the erythema appears improved compared to yesterday he is less tender to palpation  Laboratory CBC    Component Value Date/Time   WBC 9.8 02/11/2017 0520   HGB 10.5 (L) 02/11/2017 0520   HGB 16.3 01/20/2013 0255   HCT 30.9 (L) 02/11/2017 0520   HCT 47.9 01/20/2013 0255   PLT 285 02/11/2017 0520   PLT 245 01/20/2013 0255    BMET    Component Value Date/Time   NA 136 02/10/2017 1511   NA 140 01/20/2013 0255   K 4.5 02/10/2017 1511   K 3.5 01/20/2013 0255   CL 99 (L) 02/10/2017 1511   CL 103 01/20/2013 0255   CO2 26 02/10/2017 1511   CO2 30 01/20/2013 0255   GLUCOSE 244 (H) 02/10/2017 1511   GLUCOSE 145 (H) 01/20/2013 0255   BUN 19 02/10/2017 1511   BUN 22 (H) 01/20/2013 0255   CREATININE 0.85 02/10/2017 1511   CREATININE 1.09 01/20/2013 0255   CALCIUM 8.2 (L) 02/10/2017 1511   CALCIUM 9.4 01/20/2013 0255   GFRNONAA >60 02/10/2017 1511   GFRNONAA >60 01/20/2013 0255   GFRAA >60 02/10/2017 1511   GFRAA >60 01/20/2013 0255    Assessment/Planning:  Infection left below-knee amputation stump. This appears to be  superficial. I will continue parenteral antibiotics for 48 hours switch him over to oral and begin topical therapy. I do not believe surgical debridement is indicated at this time. I will anticipate discharge within the next 24-36 hours    Hortencia Pilar  02/11/2017, 7:06 PM

## 2017-02-11 NOTE — Clinical Social Work Note (Signed)
Clinical Social Work Assessment  Patient Details  Name: [name] MRN: [id number] Date of Birth: January 30, 1935  Date of referral:  02/11/17               Reason for consult:  Facility Placement                Permission sought to share information with:  Facility Sport and exercise psychologist, Family Supports Permission granted to share information::  Yes, Verbal Permission Granted  Name::        Agency::     Relationship::     Contact Information:     Housing/Transportation Living arrangements for the past 2 months:  [place] of Information:  Patient, Spouse Patient Interpreter Needed:  None Criminal Activity/Legal Involvement Pertinent to Current Situation/Hospitalization:  No - Comment as needed Significant Relationships:  Spouse Lives with:  Facility Resident Do you feel safe going back to the place where you live?  Yes Need for family participation in patient care:  No (Coment)  Care giving concerns:  None.   Social Worker assessment / plan:  CSW informed patient is a resident for [facility] at [employer]. CSW spoke with Liliane Channel at [place] and they will take him back when time. CSW spoke with patient and his wife and explained role and purpose of visit. Patient wishes to return to [facility]. He states that he wants to get back to [facility] as soon as possible.  Employment status:  Retired Nurse, adult PT Recommendations:    Information / Referral to community resources:     Patient/Family's Response to care:  Patient and wife expressed appreciation for CSW assistance.  Patient/Family's Understanding of and Emotional Response to Diagnosis, Current Treatment, and Prognosis:  Patient and wife are knowledgeable about patient's condition.  Emotional Assessment Appearance:  Appears stated age Attitude/Demeanor/Rapport:   (pleasant and cooperative) Affect (typically observed):  Accepting, Calm Orientation:  Oriented to Self, Oriented to  Place, Oriented to  Time, Oriented to Situation Alcohol / Substance use:  Not Applicable Psych involvement (Current and /or in the community):  No (Comment)  Discharge Needs  Concerns to be addressed:    Readmission within the last 30 days:  Yes Current discharge risk:  None Barriers to Discharge:  No Barriers Identified   Shela Leff, LCSW 02/11/2017, 12:49 PM

## 2017-02-11 NOTE — Progress Notes (Signed)
02/11/2017  10:47 AM  With pt permission spoke to Durwin Reges, director of nursing at [facility] where pt came from.  Gave an update on pt status and condition and suggested they check back later in day as I would have more info after vascular surgeon rounded on this pt. Dola Argyle, RN

## 2017-02-11 NOTE — Evaluation (Signed)
Physical Therapy Evaluation Patient Details Name: [name] MRN: [id number] DOB: March 14, 1935 Today's Date: 02/11/2017   History of Present Illness  presented to ER from STR due to drainage, redness from L residual limb; admitted for possible wound infection to L BKA site.  Clinical Impression  Upon evaluation, patient alert and oriented; follows simple commands and demonstrates good insight into deficits.  L hip/knee lacking approx 10-15 degrees (significant improvement from previous hospitalizations); otherwise bilat UE/LE strength and ROM grossly [facility] for basic transfers/mobility.  Currently requiring cga/min assist for bed mobility; mod assist for scoot pivot over level surfaces.  Increased assist from therapist due to R wrist pain (unable to fully utilize with transfer efforts).  Patient very pleasant and cooperative; good potential for improvement in transfers status and progression to standing activities as able. Would benefit from skilled PT to address above deficits and promote optimal return to PLOF; recommend transition to STR upon discharge from acute hospitalization.     Follow Up Recommendations SNF    Equipment Recommendations       Recommendations for Other Services       Precautions / Restrictions Precautions Precautions: Fall Restrictions Weight Bearing Restrictions: Yes RLE Weight Bearing: Weight bearing as tolerated      Mobility  Bed Mobility Overal bed mobility: Needs Assistance Bed Mobility: Supine to Sit     Supine to sit: Supervision        Transfers Overall transfer level: Needs assistance   Transfers: Lateral/Scoot Transfers          Lateral/Scoot Transfers: Mod assist General transfer comment: flat surfaces, assist for lift off/lateral movement; difficulty with WBing due to R wrist pain  Ambulation/Gait             General Gait Details: unsafe/unable  Stairs            Wheelchair Mobility    Modified Rankin (Stroke  Patients Only)       Balance Overall balance assessment: Needs assistance Sitting-balance support: No upper extremity supported;Feet supported Sitting balance-Leahy Scale: Good                                       Pertinent Vitals/Pain Pain Assessment: No/denies pain    Home Living Family/patient expects to be discharged to:: Skilled nursing facility                 Additional Comments: At baseline, lives with wife in single-story home with ramp access.  Wife able to provide 24/7 supervision as needed    Prior Function Level of Independence: Independent with assistive device(s)         Comments: At baseline (prior to amputation), ambulatory with RW for ADLs, household and community mobilization; since recent amputation (8/22), has been primarily [place] level, participating with skilled therapy at facility.     Hand Dominance   Dominant Hand: Right    Extremity/Trunk Assessment   Upper Extremity Assessment Upper Extremity Assessment: Overall WFL for tasks assessed    Lower Extremity Assessment Lower Extremity Assessment:  (L hip/knee lacking 10-15 degrees of extension, L BKA site with dry eschar.  R LE grossly WFL)       Communication   Communication: No difficulties  Cognition Arousal/Alertness: Awake/alert Behavior During Therapy: WFL for tasks assessed/performed Overall Cognitive Status: Within Functional Limits for tasks assessed  General Comments      Exercises     Assessment/Plan    PT Assessment Patient needs continued PT services  PT Problem List Decreased strength;Decreased range of motion;Decreased activity tolerance;Decreased balance;Decreased mobility;Decreased coordination;Decreased cognition;Decreased safety awareness;Decreased knowledge of use of DME;Decreased knowledge of precautions       PT Treatment Interventions Gait training;DME instruction;Functional mobility  training;Therapeutic activities;Therapeutic exercise;Balance training;Patient/family education    PT Goals (Current goals can be found in the Care Plan section)  Acute Rehab PT Goals Patient Stated Goal: to return to STR for completion of rehab PT Goal Formulation: With patient Time For Goal Achievement: 02/25/17 Potential to Achieve Goals: Good Additional Goals Additional Goal #1: Assess and establish goals for standing/gait as appropriate.    Frequency Min 2X/week   Barriers to discharge Decreased caregiver support      Co-evaluation               AM-PAC PT "6 Clicks" Daily Activity  Outcome Measure Difficulty turning over in bed (including adjusting bedclothes, sheets and blankets)?: Unable Difficulty moving from lying on back to sitting on the side of the bed? : Unable Difficulty sitting down on and standing up from a chair with arms (e.g., wheelchair, bedside commode, etc,.)?: Unable Help needed moving to and from a bed to chair (including a wheelchair)?: A Lot Help needed walking in hospital room?: A Lot Help needed climbing 3-5 steps with a railing? : Total 6 Click Score: 8    End of Session Equipment Utilized During Treatment: Gait belt Activity Tolerance: Patient tolerated treatment well Patient left: in chair;with call bell/phone within reach;with chair alarm set;with family/visitor present Nurse Communication: Mobility status PT Visit Diagnosis: Muscle weakness (generalized) (M62.81)    Time: 1150-1210 PT Time Calculation (min) (ACUTE ONLY): 20 min   Charges:   PT Evaluation $PT Eval Low Complexity: 1 Low     PT G Codes:   PT G-Codes **NOT FOR INPATIENT CLASS** Functional Assessment Tool Used: AM-PAC 6 Clicks Basic Mobility Functional Limitation: Mobility: Walking and moving around Mobility: Walking and Moving Around Current Status (M7672): At least 60 percent but less than 80 percent impaired, limited or restricted Mobility: Walking and Moving Around  Goal Status [phone]): At least 20 percent but less than 40 percent impaired, limited or restricted    Tahjae Clausing H. Owens Shark, PT, DPT, NCS 02/11/17, 4:54 PM [phone]

## 2017-02-11 NOTE — NC FL2 (Signed)
[place] LEVEL OF CARE SCREENING TOOL     IDENTIFICATION  Patient Name: [name] Birthdate: 1934-07-07 Sex: male Admission Date (Current Location): 02/10/2017  [place] and [place] Number:  Engineering geologist and Address:  [facility], [address], [place],  [address]      Provider Number: [id number]  Attending Physician Name and Address:  Katha Cabal, MD  Relative Name and Phone Number:       Current Level of Care: Hospital Recommended Level of Care: [place]  Prior Approval Number:    Date Approved/Denied:   PASRR Number:    Discharge Plan: SNF    Current Diagnoses: Patient Active Problem List   Diagnosis Date Noted  . Delayed surgical wound healing of below-the-knee amputation stump ([place]) 02/10/2017  . 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 S/P BKA (below knee amputation) unilateral, left ([place]) 02/01/2017  . Chronic diastolic heart failure ([place]) 01/21/2017  . Pressure injury of skin 01/18/2017  . 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 Diabetic foot ulcer ([place]) 12/29/2016  . Ataxia 10/21/2016  . History of femoral angiogram 09/06/2016  . Left leg pain 09/06/2016  . Leukocytosis 09/06/2016  . Generalized weakness 09/06/2016  . A-fib ([place]) 08/30/2016  . Pneumonia 05/19/2016  . Hyperlipidemia 04/20/2016  . Back pain 04/19/2016  . Type 2 diabetes mellitus treated with insulin ([place]) 04/19/2016  . Essential hypertension 04/19/2016  . PAD (peripheral artery disease) ([place]) 04/19/2016  . Erectile dysfunction following radical prostatectomy 06/25/2015  . History of prostate cancer 12/23/2014  . Incontinence 12/23/2014    Orientation RESPIRATION BLADDER Height & Weight     Self, Time, Situation, Place  Normal Incontinent Weight: 176 lb (79.8 kg) Height:  5\' 7"  (170.2 cm)  BEHAVIORAL SYMPTOMS/MOOD NEUROLOGICAL BOWEL  NUTRITION STATUS   (none)  (none) Continent Diet (regular)  AMBULATORY STATUS COMMUNICATION OF NEEDS Skin   Extensive Assist Verbally Surgical wounds                       Personal Care Assistance Level of Assistance  Bathing, Dressing Bathing Assistance: Limited assistance   Dressing Assistance: Limited assistance     Functional Limitations Info   (none)          SPECIAL CARE FACTORS FREQUENCY  PT (By licensed PT)                    Contractures Contractures Info: Not present    Additional Factors Info  Code Status, Allergies Code Status Info: full Allergies Info: contrast media; latex           Current Medications (02/11/2017):  This is the current hospital active medication list Current Facility-Administered Medications  Medication Dose Route Frequency Provider Last Rate Last Dose  . 0.9 %  sodium chloride infusion   Intravenous Continuous Schnier, Dolores Lory, MD 75 mL/hr at 02/11/17 1250    . atorvastatin (LIPITOR) tablet 80 mg  80 mg Oral q1800 Schnier, Dolores Lory, MD      . digoxin Fonnie [facility]) tablet 0.125 mg  0.125 mg Oral Daily Schnier, Dolores Lory, MD   0.125 mg at 02/11/17 0950  . diltiazem (CARDIZEM CD) 24 hr capsule 300 mg  300 mg Oral Daily Schnier, Dolores Lory, MD   300 mg at 02/11/17 0950  . docusate sodium (COLACE) capsule 100 mg  100 mg Oral BID Schnier, Dolores Lory, MD  100 mg at 02/11/17 0951  . furosemide (LASIX) tablet 20 mg  20 mg Oral Daily Schnier, Dolores Lory, MD   20 mg at 02/11/17 1032  . heparin injection 5,000 Units  5,000 Units Subcutaneous Q8H Schnier, Dolores Lory, MD   5,000 Units at 02/11/17 [phone]  . hydrALAZINE (APRESOLINE) injection 10 mg  10 mg Intravenous Q6H PRN Mody, Sital, MD      . insulin aspart (novoLOG) injection 0-9 Units  0-9 Units Subcutaneous TID WC Lenis Noon, [facility]   2 Units at 02/11/17 1207  . insulin glargine (LANTUS) injection 8 Units  8 Units Subcutaneous QHS Mody, Sital, MD      . magnesium hydroxide (MILK OF  MAGNESIA) suspension 30 mL  30 mL Oral Daily PRN Schnier, Dolores Lory, MD      . metoprolol succinate (TOPROL-XL) 24 hr tablet 100 mg  100 mg Oral Daily Schnier, Dolores Lory, MD   100 mg at 02/11/17 0951  . ondansetron (ZOFRAN) tablet 4 mg  4 mg Oral Q6H PRN Schnier, Dolores Lory, MD       Or  . ondansetron [facility]) injection 4 mg  4 mg Intravenous Q6H PRN Schnier, Dolores Lory, MD      . oxyCODONE (Oxy IR/ROXICODONE) immediate release tablet 5 mg  5 mg Oral Q6H PRN Schnier, Dolores Lory, MD      . oxyCODONE (OXYCONTIN) 12 hr tablet 10 mg  10 mg Oral Q12H Schnier, Dolores Lory, MD   10 mg at 02/11/17 1053  . piperacillin-tazobactam (ZOSYN) IVPB 3.375 g  3.375 g Intravenous Q8H Schnier, Dolores Lory, MD   Stopped at 02/11/17 1030  . senna (SENOKOT) tablet 8.6 mg  1 tablet Oral Daily PRN Schnier, Dolores Lory, MD      . sodium phosphate (FLEET) 7-19 GM/118ML enema 1 enema  1 enema Rectal Once PRN Schnier, Dolores Lory, MD      . sorbitol 70 % solution 30 mL  30 mL Oral Daily PRN Schnier, Dolores Lory, MD       Facility-Administered Medications Ordered in Other Encounters  Medication Dose Route Frequency Provider Last Rate Last Dose  . acetaminophen (TYLENOL) tablet 650 mg  650 mg Oral Q6H PRN Stegmayer, Kimberly A, PA-C       Or  . acetaminophen (TYLENOL) suppository 650 mg  650 mg Rectal Q6H PRN Stegmayer, Kimberly A, PA-C      . ondansetron (ZOFRAN) tablet 4 mg  4 mg Oral Q6H PRN Stegmayer, Kimberly A, PA-C       Or  . ondansetron (ZOFRAN) 4 mg in sodium chloride 0.9 % 50 mL IVPB  4 mg Intravenous Q6H PRN Stegmayer, Janalyn Harder, PA-C         Discharge Medications: Please see discharge summary for a list of discharge medications.  Relevant Imaging Results:  Relevant Lab Results:   Additional Information    Shela Leff, LCSW

## 2017-02-11 NOTE — Care Management (Signed)
RNCM was placed due to patient being from [place].  CSW is aware.  Please re consult RNCM if indicated.

## 2017-02-11 NOTE — Consult Note (Signed)
Medical Consultation  [name] SEG:[id number] DOB: [birth year] DOA: 02/10/2017 PCP: Alanson Aly, FNP   Requesting physician: dr schnier Date of consultation: 02/11/2017 Reason for consultation: medical management  Impression/Recommendations  82 year old male with chronic diastolic heart failure, chronic atrial fibrillation who presented from rehabilitation Center with concerns of infection of left below knee amputation stump.  1. Diabetes: Continue ADA diet, Lantus and sliding scale Diabetes coordinator consultation requested  2. Chronic atrial fibrillation: Continue digoxin and diltiazem. If patient not going to surgery then he can go back on Coumadin  3. Chronic diastolic heart failure without exacerbation: Appears stable at this time  4. Hyperlipidemia: Continue statin  5. PAD: Continue atorvastatin  6 Chronic kidney disease stage III: Creatinine remains at baseline  Chief Complaint:  Concern of infected stump  HPI:   82 year old male who comes from skilled nursing facility due to concerns of infection of left below-knee amputation stump. Hospital service was consulted for medical management.  Review of Systems  Constitutional: Negative for fever, chills weight loss HENT: Negative for ear pain, nosebleeds, congestion, facial swelling, rhinorrhea, neck pain, neck stiffness and ear discharge.   Respiratory: Negative for cough, shortness of breath, wheezing  Cardiovascular: Negative for chest pain, palpitations and leg swelling.  Gastrointestinal: Negative for heartburn, abdominal pain, vomiting, diarrhea or consitpation Genitourinary: Negative for dysuria, urgency, frequency, hematuria Musculoskeletal: Negative for back pain or joint pain Neurological: Negative for dizziness, seizures, syncope, focal weakness,  numbness and headaches.  Hematological: Does not bruise/bleed easily.  Psychiatric/Behavioral: Negative for hallucinations, confusion, dysphoric  mood   Past Medical History:  Diagnosis Date  . Arthritis   . Atrial fibrillation ([place])   . Carcinoma of prostate ([place])   . CHF (congestive heart failure) ([place])   . Chronic kidney disease   . Diabetes mellitus without complication ([place])   . ED (erectile dysfunction)   . Frequent urination   . Hyperlipidemia   . Hypertension   . Moderate mitral insufficiency   . Peripheral vascular disease ([place])   . Pneumonia 04/2016  . Prostate cancer ([place])   . Sleep apnea    OSA--USE C-PAP  . Ulcer of left foot due to type 2 diabetes mellitus ([place])   . Urinary stress incontinence, male    Past Surgical History:  Procedure Laterality Date  . AMPUTATION Left 01/12/2017   Procedure: AMPUTATION BELOW KNEE;  Surgeon: Algernon Huxley, MD;  Location: [facility] ORS;  Service: General;  Laterality: Left;  . APLIGRAFT PLACEMENT Left 10/15/2016   Procedure: APLIGRAFT PLACEMENT;  Surgeon: Albertine Patricia, DPM;  Location: [facility] ORS;  Service: Podiatry;  Laterality: Left;  necrotic ulcer  . CHOLECYSTECTOMY    . EYE SURGERY Bilateral    Cataract Extraction with IOL  . IRRIGATION AND DEBRIDEMENT FOOT Left 10/15/2016   Procedure: IRRIGATION AND DEBRIDEMENT FOOT-EXCISIONAL DEBRIDEMENT OF SKIN 3RD, 4TH AND 5TH TOES WITH APPLICATION OF APLIGRAFT;  Surgeon: Albertine Patricia, DPM;  Location: [facility] ORS;  Service: Podiatry;  Laterality: Left;  necrotic,gangrene  . IRRIGATION AND DEBRIDEMENT FOOT Left 12/09/2016   Procedure: IRRIGATION AND DEBRIDEMENT FOOT-MUSCLE/FASCIA, RESECTION OF FOURTH AND FIFTH METATARSAL NECROTIC BONE;  Surgeon: Albertine Patricia, DPM;  Location: [facility] ORS;  Service: Podiatry;  Laterality: Left;  . LOWER EXTREMITY ANGIOGRAPHY Left 08/16/2016   Procedure: Lower Extremity Angiography;  Surgeon: Algernon Huxley, MD;  Location: [place] [facility];  Service: Cardiovascular;  Laterality: Left;  . LOWER EXTREMITY ANGIOGRAPHY Left 09/01/2016   Procedure: Lower Extremity Angiography;  Surgeon: Algernon Huxley, MD;  Location:  [place] [facility];  Service: Cardiovascular;  Laterality: Left;  . LOWER EXTREMITY ANGIOGRAPHY Left 10/14/2016   Procedure: Lower Extremity Angiography;  Surgeon: Algernon Huxley, MD;  Location: [place] [facility];  Service: Cardiovascular;  Laterality: Left;  . LOWER EXTREMITY ANGIOGRAPHY Left 12/20/2016   Procedure: Lower Extremity Angiography;  Surgeon: Algernon Huxley, MD;  Location: [place] [facility];  Service: Cardiovascular;  Laterality: Left;  . LOWER EXTREMITY INTERVENTION  09/01/2016   Procedure: Lower Extremity Intervention;  Surgeon: Algernon Huxley, MD;  Location: [place] [facility];  Service: Cardiovascular;;  . PROSTATE SURGERY     removal  . TONSILLECTOMY     as a child   Social History:  reports that he quit smoking about 22 years ago. His smoking use included Cigarettes. He smoked 0.25 packs per day. He has never used smokeless tobacco. He reports that he does not drink alcohol or use drugs.  Allergies  Allergen Reactions  . Contrast Media [Iodinated Diagnostic Agents] Shortness Of Breath    SOB  . Iohexol Shortness Of Breath     Desc: Respiratory Distress, Laryngedema, diaphoresis, Onset Date: [id number]   . Latex Rash   Family History  Problem Relation Age of Onset  . Hypertension Mother   . Diabetes Mother   . Prostate cancer Neg Hx   . Bladder Cancer Neg Hx   . Kidney disease Neg Hx     Prior to Admission medications   Medication Sig Start Date End Date Taking? Authorizing Provider  atorvastatin (LIPITOR) 80 MG tablet Take 80 mg by mouth daily at 6 PM.    Yes [provider]  digoxin (LANOXIN) 0.125 MG tablet Take 1 tablet (0.125 mg total) by mouth daily. 01/20/17  Yes Henreitta Leber, MD  diltiazem (CARDIZEM CD) 300 MG 24 hr capsule Take 300 mg by mouth daily.   Yes [provider]  furosemide (LASIX) 20 MG tablet TAKE 1 TABLET DAILY 10/24/15  Yes [provider]  insulin aspart (NOVOLOG) 100 UNIT/ML injection Inject 0-5 Units into the  skin 3 (three) times daily with meals.    Yes [provider]  insulin glargine (LANTUS) 100 UNIT/ML injection Inject 0.1 mLs (10 Units total) into the skin at bedtime. 01/19/17  Yes Henreitta Leber, MD  metFORMIN (GLUCOPHAGE) 1000 MG tablet TAKE 1 TABLET TWICE A DAY WITH MEALS 11/28/15  Yes [provider]  oxyCODONE (OXYCONTIN) 10 mg 12 hr tablet Take 1 tablet (10 mg total) by mouth every 12 (twelve) hours. 01/19/17  Yes Sainani, Belia Heman, MD  TOPROL XL 100 MG 24 hr tablet Take 100 mg by mouth daily. 12/25/16  Yes [provider]  TRULICITY 3.76 EG/3.1DV SOPN 0.75 mg once a week. 11/14/16  Yes [provider]  oxyCODONE (OXY IR/ROXICODONE) 5 MG immediate release tablet Take 1 tablet (5 mg total) by mouth every 6 (six) hours as needed for moderate pain. 01/19/17   Henreitta Leber, MD    Physical Exam: Blood pressure (!) 159/78, pulse 74, temperature 98.1 F (36.7 C), temperature source Oral, resp. rate 18, height 5\' 7"  (1.702 m), weight 79.8 kg (176 lb), SpO2 97 %. @VITALS2 @ Filed Weights   02/10/17 1452 02/11/17 0500  Weight: 83.9 kg (185 lb) 79.8 kg (176 lb)    Intake/Output Summary (Last 24 hours) at 02/11/17 1015 Last data filed at 02/11/17 0600  Gross per 24 hour  Intake  635 ml  Output              250 ml  Net              385 ml     Constitutional: Appears well-developed and well-nourished. No distress. HENT: Normocephalic. Marland Kitchen Oropharynx is clear and moist.  Eyes: Conjunctivae and EOM are normal. PERRLA, no scleral icterus.  Neck: Normal ROM. Neck supple. No JVD. No tracheal deviation. CVS: RRR, S1/S2 +, no murmurs, no gallops, no carotid bruit.  Pulmonary: Effort and breath sounds normal, no stridor, rhonchi, wheezes, rales.  Abdominal: Soft. BS +,  no distension, tenderness, rebound or guarding.  Musculoskeletal: Normal range of motion. No edema and no tenderness.  Neuro: Alert. CN 2-12 grossly intact. No focal deficits. Skin: Left  stump has staples and eschar around the staples. No discharge  Psychiatric: Normal mood and affect.    Labs  Basic Metabolic Panel:  Recent Labs Lab 02/10/17 1511  NA 136  K 4.5  CL 99*  CO2 26  GLUCOSE 244*  BUN 19  CREATININE 0.85  CALCIUM 8.2*   Liver Function Tests:  Recent Labs Lab 02/10/17 1511  AST 34  ALT 27  ALKPHOS 213*  BILITOT 0.5  PROT 6.1*  ALBUMIN 2.6*   No results for input(s): LIPASE, AMYLASE in the last 168 hours.  CBC:  Recent Labs Lab 02/10/17 1511 02/11/17 0520  WBC 12.6* 9.8  NEUTROABS 9.9*  --   HGB 10.4* 10.5*  HCT 31.5* 30.9*  MCV 82.0 81.3  PLT 357 285   Cardiac Enzymes: No results for input(s): CKTOTAL, CKMB, CKMBINDEX, TROPONINI in the last 168 hours. BNP: Invalid input(s): POCBNP CBG:  Recent Labs Lab 02/11/17 0744  GLUCAP 95    Radiological Exams: No results found.     Thank you for allowing me to participate in the care of your patient. We will continue to follow.   Note: This dictation was prepared with Dragon dictation along with smaller phrase technology. Any transcriptional errors that result from this process are unintentional.  Time spent: 45 minutes  Andres Vest, MD

## 2017-02-11 NOTE — Plan of Care (Signed)
Problem: Physical Regulation: Goal: Will remain free from infection Outcome: Progressing Pt is getting IV antibiotics

## 2017-02-11 NOTE — Progress Notes (Signed)
Inpatient Diabetes Program Recommendations  AACE/ADA: New Consensus Statement on Inpatient Glycemic Control (2015)  Target Ranges:  Prepandial:   less than 140 mg/dL      Peak postprandial:   less than 180 mg/dL (1-2 hours)      Critically ill patients:  140 - 180 mg/dL   Lab Results  Component Value Date   GLUCAP 95 02/11/2017   HGBA1C 7.4 (H) 12/30/2016    Review of Glycemic Control  Results for VINICIO, LYNK (MRN [id number]) as of 02/11/2017 08:45  Ref. Range 02/10/2017 21:30 02/11/2017 07:44  Glucose-Capillary Latest Ref Range: 65 - 99 mg/dL 108 (H) 95    Diabetes history: Type 2 Outpatient Diabetes medications: Trulicity 0.75mg /week, Novolog 0-5 units tid, Lantus 10 units qhs, Metformin 1000mg  bid Current orders for Inpatient glycemic control: Lantus 10 units qhs, Novolog 0-9 units tid  Inpatient Diabetes Program Recommendations:  Consider decreasing Lantus to 8 units qhs and change Novolog 0-9 units tid to Novolog custom correction insulin 0-5units tid with meals.  Spoke with patient at the bedside- he is unsure of all his diabetes medications at home but does complain of occasional low blood sugars- could not verbalize when they might occur. Gentry Fitz, RN, BA, MHA, CDE Diabetes Coordinator Inpatient Diabetes Program  [phone] (Team Pager) [phone] ([place]) 02/11/2017 8:50 AM

## 2017-02-12 ENCOUNTER — Inpatient Hospital Stay: Payer: Medicare Other

## 2017-02-12 LAB — BASIC METABOLIC PANEL
ANION GAP: 8 (ref 5–15)
BUN: 14 mg/dL (ref 6–20)
CALCIUM: 8.4 mg/dL — AB (ref 8.9–10.3)
CO2: 28 mmol/L (ref 22–32)
Chloride: 101 mmol/L (ref 101–111)
Creatinine, Ser: 0.69 mg/dL (ref 0.61–1.24)
Glucose, Bld: 140 mg/dL — ABNORMAL HIGH (ref 65–99)
POTASSIUM: 3.8 mmol/L (ref 3.5–5.1)
SODIUM: 137 mmol/L (ref 135–145)

## 2017-02-12 LAB — CBC
HCT: 34.7 % — ABNORMAL LOW (ref 40.0–52.0)
Hemoglobin: 11.8 g/dL — ABNORMAL LOW (ref 13.0–18.0)
MCH: 28 pg (ref 26.0–34.0)
MCHC: 33.9 g/dL (ref 32.0–36.0)
MCV: 82.6 fL (ref 80.0–100.0)
PLATELETS: 296 10*3/uL (ref 150–440)
RBC: 4.2 MIL/uL — ABNORMAL LOW (ref 4.40–5.90)
RDW: 17.3 % — ABNORMAL HIGH (ref 11.5–14.5)
WBC: 12 10*3/uL — ABNORMAL HIGH (ref 3.8–10.6)

## 2017-02-12 LAB — URIC ACID: URIC ACID, SERUM: 2.2 mg/dL — AB (ref 4.4–7.6)

## 2017-02-12 LAB — GLUCOSE, CAPILLARY
Glucose-Capillary: 134 mg/dL — ABNORMAL HIGH (ref 65–99)
Glucose-Capillary: 141 mg/dL — ABNORMAL HIGH (ref 65–99)

## 2017-02-12 MED ORDER — SILVER SULFADIAZINE 1 % EX CREA
TOPICAL_CREAM | Freq: Every day | CUTANEOUS | Status: DC
  Administered 2017-02-12: 13:00:00 via TOPICAL
  Filled 2017-02-12: qty 85

## 2017-02-12 MED ORDER — AMOXICILLIN-POT CLAVULANATE 875-125 MG PO TABS
1.0000 | ORAL_TABLET | Freq: Two times a day (BID) | ORAL | Status: DC
  Filled 2017-02-12: qty 1

## 2017-02-12 MED ORDER — AMOXICILLIN-POT CLAVULANATE 875-125 MG PO TABS: 1.0000 | ORAL_TABLET | Freq: Two times a day (BID) | ORAL | 0 refills | Status: AC

## 2017-02-12 MED ORDER — PREDNISONE 20 MG PO TABS
40.0000 mg | ORAL_TABLET | Freq: Every day | ORAL | Status: DC
  Administered 2017-02-12: 40 mg via ORAL
  Filled 2017-02-12: qty 2

## 2017-02-12 MED ORDER — SILVER SULFADIAZINE 1 % EX CREA: TOPICAL_CREAM | Freq: Every day | CUTANEOUS | 0 refills | Status: DC

## 2017-02-12 MED ORDER — FUROSEMIDE 20 MG PO TABS: 20.0000 mg | ORAL_TABLET | Freq: Every day | ORAL | 0 refills | Status: DC

## 2017-02-12 NOTE — Progress Notes (Signed)
[facility] [place] NAME: [name]    MR#:  [id number]  DATE OF BIRTH:  Mar 03, 1935  SUBJECTIVE:   c/o right wrist pain  REVIEW OF SYSTEMS:    Review of Systems  Constitutional: Negative for fever, chills weight loss HENT: Negative for ear pain, nosebleeds, congestion, facial swelling, rhinorrhea, neck pain, neck stiffness and ear discharge.   Respiratory: Negative for cough, shortness of breath, wheezing  Cardiovascular: Negative for chest pain, palpitations and leg swelling.  Gastrointestinal: Negative for heartburn, abdominal pain, vomiting, diarrhea or consitpation Genitourinary: Negative for dysuria, urgency, frequency, hematuria Musculoskeletal: Negative for back pain or joint pain Positive for right wrist pain Neurological: Negative for dizziness, seizures, syncope, focal weakness,  numbness and headaches.  Hematological: Does not bruise/bleed easily.  Psychiatric/Behavioral: Negative for hallucinations, confusion, dysphoric mood    Tolerating Diet: yes      DRUG ALLERGIES:   Allergies  Allergen Reactions  . Contrast Media [Iodinated Diagnostic Agents] Shortness Of Breath    SOB  . Iohexol Shortness Of Breath     Desc: Respiratory Distress, Laryngedema, diaphoresis, Onset Date: [id number]   . Latex Rash    VITALS:  Blood pressure (!) 152/71, pulse 85, temperature 98.1 F (36.7 C), temperature source Oral, resp. rate 16, height 5\' 7"  (1.702 m), weight 80.2 kg (176 lb 12.8 oz), SpO2 93 %.  PHYSICAL EXAMINATION:  Constitutional: Appears well-developed and well-nourished. No distress. HENT: Normocephalic. Marland Kitchen Oropharynx is clear and moist.  Eyes: Conjunctivae and EOM are normal. PERRLA, no scleral icterus.  Neck: Normal ROM. Neck supple. No JVD. No tracheal deviation. CVS: RRR, S1/S2 +, no murmurs, no gallops, no carotid bruit.  Pulmonary: Effort and breath sounds normal, no stridor, rhonchi, wheezes, rales.   Abdominal: Soft. BS +,  no distension, tenderness, rebound or guarding.  Musculoskeletal:Right wrist has some swelling and tenderness at the joint line  No erythema noted decreased range of motion.   Neuro: Alert. CN 2-12 grossly intact. No focal deficits. Skin: Skin is warm and dry. No rash noted. Psychiatric: Normal mood and affect.      LABORATORY PANEL:   CBC  Recent Labs Lab 02/12/17 0351  WBC 12.0*  HGB 11.8*  HCT 34.7*  PLT 296   ------------------------------------------------------------------------------------------------------------------  Chemistries   Recent Labs Lab 02/10/17 1511 02/12/17 0351  NA 136 137  K 4.5 3.8  CL 99* 101  CO2 26 28  GLUCOSE 244* 140*  BUN 19 14  CREATININE 0.85 0.69  CALCIUM 8.2* 8.4*  AST 34  --   ALT 27  --   ALKPHOS 213*  --   BILITOT 0.5  --    ------------------------------------------------------------------------------------------------------------------  Cardiac Enzymes No results for input(s): TROPONINI in the last 168 hours. ------------------------------------------------------------------------------------------------------------------  RADIOLOGY:  No results found.   ASSESSMENT AND PLAN:   82 year old male with chronic diastolic heart failure, chronic atrial fibrillation who presented from rehabilitation Center with concerns infection of the left below-knee amputation stump.  1. Right wrist pain: Check x-ray to rule out fracture Uric acid level is within normal limits Start prednisone Orthopedic consultation requested  2. Diabetes: Continue ADA diet with Lantus  3. Chronic atrial fibrillation: Continue digoxin and diltiazem Patient should follow-up with his cardiologist in the outpatient. It does not appear the patient is currently on anticoagulation as per the [facility]  4. Hyperlipidemia: Continue atorvastatin  5. Left below knee specific indication; management as per vascular surgery  Management  plans discussed  with the patient and he is in agreement.  CODE STATUS: full  TOTAL TIME TAKING CARE OF THIS PATIENT: 30 minutes.   I will sign off if patient is being discharged today.  POSSIBLE D/C today, DEPENDING ON CLINICAL CONDITION.   Shamel Germond M.D on 02/12/2017 at 10:19 AM  Between 7am to 6pm - Pager - [phone] After 6pm go to [email or web address] - password [email or web address] [facility]  Office  [phone]  CC: Primary care physician; Alanson Aly, FNP  Note: This dictation was prepared with Dragon dictation along with smaller phrase technology. Any transcriptional errors that result from this process are unintentional.

## 2017-02-12 NOTE — Progress Notes (Signed)
[name] to be D/C'd Rehab per MD order.  Discussed prescriptions and follow up appointments with the patient. Prescriptions given to patient, medication list explained in detail. Pt verbalized understanding.  Allergies as of 02/12/2017      Reactions   Contrast Media [iodinated Diagnostic Agents] Shortness Of Breath   SOB   Iohexol Shortness Of Breath    Desc: Respiratory Distress, Laryngedema, diaphoresis, Onset Date: [id number]   Latex Rash      Medication List    TAKE these medications   amoxicillin-clavulanate 875-125 MG tablet Commonly known as:  AUGMENTIN Take 1 tablet by mouth every 12 (twelve) hours.   atorvastatin 80 MG tablet Commonly known as:  LIPITOR Take 80 mg by mouth daily at 6 PM.   digoxin 0.125 MG tablet Commonly known as:  LANOXIN Take 1 tablet (0.125 mg total) by mouth daily.   diltiazem 300 MG 24 hr capsule Commonly known as:  CARDIZEM CD Take 300 mg by mouth daily.   furosemide 20 MG tablet Commonly known as:  LASIX Take 1 tablet (20 mg total) by mouth daily. What changed:  See the new instructions.   insulin aspart 100 UNIT/ML injection Commonly known as:  novoLOG Inject 0-5 Units into the skin 3 (three) times daily with meals.   insulin glargine 100 UNIT/ML injection Commonly known as:  LANTUS Inject 0.1 mLs (10 Units total) into the skin at bedtime.   metFORMIN 1000 MG tablet Commonly known as:  GLUCOPHAGE TAKE 1 TABLET TWICE A DAY WITH MEALS   oxyCODONE 5 MG immediate release tablet Commonly known as:  Oxy IR/ROXICODONE Take 1 tablet (5 mg total) by mouth every 6 (six) hours as needed for moderate pain.   oxyCODONE 10 mg 12 hr tablet Commonly known as:  OXYCONTIN Take 1 tablet (10 mg total) by mouth every 12 (twelve) hours.   silver sulfADIAZINE 1 % cream Commonly known as:  SILVADENE Apply topically daily.   TOPROL XL 100 MG 24 hr tablet Generic drug:  metoprolol succinate Take 100 mg by mouth daily.   TRULICITY 2.95  AO/1.3YQ Sopn Generic drug:  Dulaglutide 0.75 mg once a week.            Discharge Care Instructions        Start     Ordered   02/13/17 0000  furosemide (LASIX) 20 MG tablet  Daily     02/12/17 1140   02/12/17 0000  amoxicillin-clavulanate (AUGMENTIN) 875-125 MG tablet  Every 12 hours     02/12/17 1109   02/12/17 0000  silver sulfADIAZINE (SILVADENE) 1 % cream  Daily     02/12/17 1109   02/12/17 0000  Resume previous diet     02/12/17 1109   02/12/17 0000  Call MD for:  temperature >100.5     02/12/17 1109   02/12/17 0000  Call MD for:  redness, tenderness, or signs of infection (pain, swelling, bleeding, redness, odor or green/yellow discharge around incision site)     02/12/17 1109   02/12/17 0000  Call MD for:  severe or increased pain, loss or decreased feeling  in affected limb(s)     02/12/17 1109   02/12/17 0000  Discharge instructions    Comments:  Wound Care left BKA stump  Apply Silvadene cream to left BKA staple line and the area of eschar then pad with gauze and wrap with Kerlex.  Change daily    OK to continue PT/OT full weight bearing to the right leg  and both arms   02/12/17 1109      Vitals:   02/12/17 1405 02/12/17 1439  BP: (!) 151/94   Pulse: (!) 139 97  Resp: 16   Temp: 97.6 F (36.4 C)   SpO2: 98% 97%    Skin clean, dry and intact without evidence of skin break down, no evidence of skin tears noted. IV catheter discontinued intact. Site without signs and symptoms of complications. Dressing and pressure applied. Pt denies pain at this time. No complaints noted.  An After Visit Summary was printed and given to the patient. Patient escorted via [place], and D/C home via private auto.  Carlos American [name] 24

## 2017-02-12 NOTE — Clinical Social Work Note (Addendum)
The patient will discharge to [facility] via non-emergent EMS. The patient's family and facility are aware and in agreement. CSW has delivered packet.  The attending RN called to alert the CSW that the facility was not receiving the paperwork. The CSW contacted RN Gregary Signs at [facility] who found that the facility did not have paper in the fax machine. Gregary Signs confirmed that she received the discharge summary and gave permission for the patient to discharge. The CSW updated the attending RN and will continue to follow pending additional discharge needs.  Santiago Bumpers, MSW, Latanya Presser [phone]

## 2017-02-12 NOTE — Discharge Summary (Signed)
[facility]    Discharge Summary    Patient ID:  [name] MRN: [id number] DOB/AGE: February 16, 1935 82 y.o.  Admit date: 02/10/2017 Discharge date: 02/12/2017 Date of Surgery:  Surgeon: * Surgery not found *  Admission Diagnosis: Wound infection [T14.8XXA, L08.9] S/P BKA (below knee amputation) unilateral, left ([place]) [Y86.578]  Discharge Diagnoses:  Wound infection [T14.8XXA, L08.9] S/P BKA (below knee amputation) unilateral, left ([place]) [I69.629]  Secondary Diagnoses: Past Medical History:  Diagnosis Date  . Arthritis   . Atrial fibrillation ([place])   . Carcinoma of prostate ([place])   . CHF (congestive heart failure) ([place])   . Chronic kidney disease   . Diabetes mellitus without complication ([place])   . ED (erectile dysfunction)   . Frequent urination   . Hyperlipidemia   . Hypertension   . Moderate mitral insufficiency   . Peripheral vascular disease ([place])   . Pneumonia 04/2016  . Prostate cancer ([place])   . Sleep apnea    OSA--USE C-PAP  . Ulcer of left foot due to type 2 diabetes mellitus ([place])   . Urinary stress incontinence, male       Discharged Condition: good  HPI:  Patient was admitted to the hospital with infection of the left below-knee amputation stump. He is status post left below-knee amputation on 22 August, 2018. He was seen this past Thursday in the office and concerns were raised regarding infection of the stump. He was noted to have dry eschar in the medial aspect. No significant drainage was noted. The patient denied fever chills. Nevertheless he presented to the emergency room and was subsequently admitted and started on Zosyn.  Over the course of 48 hours there is been resolution of the redness around the suture line. The suture line remains dry without any drainage. Half of the staples that were essentially all within the eschar were removed upon removing several loose staples there was a small drop of purulent material and  this was cultured however probing did not demonstrate any cavity and attempts expressing any further liquid were unsuccessful. He therefore will be discharged back to his skilled nursing Silvadene dressing will be applied to the stump and he will be continued on Augmentin for 7 more days  Hospital Course:  [name] is a 82 y.o. male is S/P Left  Extubated: POD # 0 Physical exam: Left below-knee amputation stump with dry eschar erythema has resolved there is no induration or edema. There is no significant drainage. Post-op wounds dehisced or healing well Pt. Ambulating, voiding and taking PO diet without difficulty. Pt pain controlled with PO pain meds. Labs as below Complications:none  Consults:  Treatment Team:  Lovell Sheehan, MD  Significant Diagnostic Studies: CBC Lab Results  Component Value Date   WBC 12.0 (H) 02/12/2017   HGB 11.8 (L) 02/12/2017   HCT 34.7 (L) 02/12/2017   MCV 82.6 02/12/2017   PLT 296 02/12/2017    BMET    Component Value Date/Time   NA 137 02/12/2017 0351   NA 140 01/20/2013 0255   K 3.8 02/12/2017 0351   K 3.5 01/20/2013 0255   CL 101 02/12/2017 0351   CL 103 01/20/2013 0255   CO2 28 02/12/2017 0351   CO2 30 01/20/2013 0255   GLUCOSE 140 (H) 02/12/2017 0351   GLUCOSE 145 (H) 01/20/2013 0255   BUN 14 02/12/2017 0351   BUN 22 (H) 01/20/2013 0255   CREATININE 0.69 02/12/2017 0351   CREATININE 1.09 01/20/2013  0255   CALCIUM 8.4 (L) 02/12/2017 0351   CALCIUM 9.4 01/20/2013 0255   GFRNONAA >60 02/12/2017 0351   GFRNONAA >60 01/20/2013 0255   GFRAA >60 02/12/2017 0351   GFRAA >60 01/20/2013 0255   COAG Lab Results  Component Value Date   INR 1.44 02/10/2017   INR 4.17 (HH) 01/27/2017   INR 2.45 01/19/2017     Disposition:  Discharge to :Skilled nursing facility Discharge Instructions    Call MD for:  redness, tenderness, or signs of infection (pain, swelling, bleeding, redness, odor or green/yellow discharge around incision  site)    Complete by:  As directed    Call MD for:  severe or increased pain, loss or decreased feeling  in affected limb(s)    Complete by:  As directed    Call MD for:  temperature >100.5    Complete by:  As directed    Discharge instructions    Complete by:  As directed    Wound Care left BKA stump  Apply Silvadene cream to left BKA staple line and the area of eschar then pad with gauze and wrap with Kerlex.  Change daily    OK to continue PT/OT full weight bearing to the right leg and both arms   Resume previous diet    Complete by:  As directed      Allergies as of 02/12/2017      Reactions   Contrast Media [iodinated Diagnostic Agents] Shortness Of Breath   SOB   Iohexol Shortness Of Breath    Desc: Respiratory Distress, Laryngedema, diaphoresis, Onset Date: [id number]   Latex Rash      Medication List    TAKE these medications   amoxicillin-clavulanate 875-125 MG tablet Commonly known as:  AUGMENTIN Take 1 tablet by mouth every 12 (twelve) hours.   atorvastatin 80 MG tablet Commonly known as:  LIPITOR Take 80 mg by mouth daily at 6 PM.   digoxin 0.125 MG tablet Commonly known as:  LANOXIN Take 1 tablet (0.125 mg total) by mouth daily.   diltiazem 300 MG 24 hr capsule Commonly known as:  CARDIZEM CD Take 300 mg by mouth daily.   furosemide 20 MG tablet Commonly known as:  LASIX Take 1 tablet (20 mg total) by mouth daily. What changed:  See the new instructions.   insulin aspart 100 UNIT/ML injection Commonly known as:  novoLOG Inject 0-5 Units into the skin 3 (three) times daily with meals.   insulin glargine 100 UNIT/ML injection Commonly known as:  LANTUS Inject 0.1 mLs (10 Units total) into the skin at bedtime.   metFORMIN 1000 MG tablet Commonly known as:  GLUCOPHAGE TAKE 1 TABLET TWICE A DAY WITH MEALS   oxyCODONE 5 MG immediate release tablet Commonly known as:  Oxy IR/ROXICODONE Take 1 tablet (5 mg total) by mouth every 6 (six) hours as needed  for moderate pain.   oxyCODONE 10 mg 12 hr tablet Commonly known as:  OXYCONTIN Take 1 tablet (10 mg total) by mouth every 12 (twelve) hours.   silver sulfADIAZINE 1 % cream Commonly known as:  SILVADENE Apply topically daily.   TOPROL XL 100 MG 24 hr tablet Generic drug:  metoprolol succinate Take 100 mg by mouth daily.   TRULICITY 5.40 JW/1.1BJ Sopn Generic drug:  Dulaglutide 0.75 mg once a week.            Discharge Care Instructions        Start     Ordered  02/13/17 0000  furosemide (LASIX) 20 MG tablet  Daily     02/12/17 1140   02/12/17 0000  amoxicillin-clavulanate (AUGMENTIN) 875-125 MG tablet  Every 12 hours     02/12/17 1109   02/12/17 0000  silver sulfADIAZINE (SILVADENE) 1 % cream  Daily     02/12/17 1109   02/12/17 0000  Resume previous diet     02/12/17 1109   02/12/17 0000  Call MD for:  temperature >100.5     02/12/17 1109   02/12/17 0000  Call MD for:  redness, tenderness, or signs of infection (pain, swelling, bleeding, redness, odor or green/yellow discharge around incision site)     02/12/17 1109   02/12/17 0000  Call MD for:  severe or increased pain, loss or decreased feeling  in affected limb(s)     02/12/17 1109   02/12/17 0000  Discharge instructions    Comments:  Wound Care left BKA stump  Apply Silvadene cream to left BKA staple line and the area of eschar then pad with gauze and wrap with Kerlex.  Change daily    OK to continue PT/OT full weight bearing to the right leg and both arms   02/12/17 1109     Verbal and written Discharge instructions given to the patient. Wound care per Discharge AVS Contact information for after-discharge care    Destination    [facility] .   Specialty:  [place] information: [address] [place]              Signed: Hortencia Pilar, MD  02/12/2017, 11:41 AM

## 2017-02-14 LAB — AEROBIC CULTURE W GRAM STAIN (SUPERFICIAL SPECIMEN)

## 2017-02-14 LAB — AEROBIC CULTURE  (SUPERFICIAL SPECIMEN): GRAM STAIN: NONE SEEN

## 2017-02-14 NOTE — Progress Notes (Signed)
Culture Report  - FOLLOW UP   Allergies  Allergen Reactions  . Contrast Media [Iodinated Diagnostic Agents] Shortness Of Breath    SOB  . Iohexol Shortness Of Breath     Desc: Respiratory Distress, Laryngedema, diaphoresis, Onset Date: [id number]   . Latex Rash    Patient Measurements: Height: 5\' 7"  (170.2 cm) Weight: 176 lb 12.8 oz (80.2 kg) IBW/kg (Calculated) : 66.1  Microbiology: Recent Results (from the past 720 hour(s))  Blood culture (routine x 2)     Status: None (Preliminary result)   Collection Time: 02/10/17  4:39 PM  Result Value Ref Range Status   Specimen Description BLOOD RIGHT ANTECUBITAL  Final   Special Requests   Final    BOTTLES DRAWN AEROBIC AND ANAEROBIC Blood Culture results may not be optimal due to an excessive volume of blood received in culture bottles   Culture NO GROWTH 4 DAYS  Final   Report Status PENDING  Incomplete  Blood culture (routine x 2)     Status: None (Preliminary result)   Collection Time: 02/10/17  4:39 PM  Result Value Ref Range Status   Specimen Description BLOOD LEFT ANTECUBITAL  Final   Special Requests   Final    BOTTLES DRAWN AEROBIC AND ANAEROBIC Blood Culture results may not be optimal due to an excessive volume of blood received in culture bottles   Culture NO GROWTH 4 DAYS  Final   Report Status PENDING  Incomplete  Wound or Superficial Culture     Status: None   Collection Time: 02/10/17  4:39 PM  Result Value Ref Range Status   Specimen Description WOUND  Final   Special Requests LL  Final   Gram Stain NO WBC SEEN FEW GRAM POSITIVE COCCI   Final   Culture   Final    FEW METHICILLIN RESISTANT STAPHYLOCOCCUS AUREUS WITHIN MIXED CULTURE Performed at [facility], [place] [address] [place], [place] [address]    Report Status 02/14/2017 FINAL  Final   Organism ID, Bacteria METHICILLIN RESISTANT STAPHYLOCOCCUS AUREUS  Final      Susceptibility   Methicillin resistant staphylococcus aureus - MIC*    CIPROFLOXACIN  >=8 RESISTANT Resistant     ERYTHROMYCIN >=8 RESISTANT Resistant     GENTAMICIN <=0.5 SENSITIVE Sensitive     OXACILLIN >=4 RESISTANT Resistant     TETRACYCLINE <=1 SENSITIVE Sensitive     VANCOMYCIN <=0.5 SENSITIVE Sensitive     TRIMETH/SULFA >=320 RESISTANT Resistant     CLINDAMYCIN <=0.25 SENSITIVE Sensitive     RIFAMPIN <=0.5 SENSITIVE Sensitive     Inducible Clindamycin NEGATIVE Sensitive     * FEW METHICILLIN RESISTANT STAPHYLOCOCCUS AUREUS  Aerobic Culture (superficial specimen)     Status: None (Preliminary result)   Collection Time: 02/12/17 11:00 AM  Result Value Ref Range Status   Specimen Description WOUND  Final   Special Requests BELOW KNEE AMPUTATION  Final   Gram Stain   Final    RARE WBC PRESENT, PREDOMINANTLY PMN ABUNDANT GRAM POSITIVE COCCI    Culture   Final    CULTURE REINCUBATED FOR BETTER GROWTH Performed at [facility], [address] [place], [place] [address]    Report Status PENDING  Incomplete     Plan:  Spoke with Dr. Delana Meyer who ordered antibiotics changed to Clindamycin 300mg  PO TID.   Order changed called to Carilyn Goodpasture at [facility]; orders read back and accepted.   Simpson,Michael L 02/14/2017,1:38 PM

## 2017-02-15 LAB — CULTURE, BLOOD (ROUTINE X 2)
CULTURE: NO GROWTH
Culture: NO GROWTH

## 2017-02-16 LAB — AEROBIC CULTURE W GRAM STAIN (SUPERFICIAL SPECIMEN)

## 2017-02-16 LAB — AEROBIC CULTURE  (SUPERFICIAL SPECIMEN)

## 2017-02-18 ENCOUNTER — Ambulatory Visit (INDEPENDENT_AMBULATORY_CARE_PROVIDER_SITE_OTHER): Payer: Medicare Other | Admitting: Vascular Surgery

## 2017-02-18 ENCOUNTER — Encounter (INDEPENDENT_AMBULATORY_CARE_PROVIDER_SITE_OTHER): Payer: Self-pay | Admitting: Vascular Surgery

## 2017-02-18 VITALS — BP 112/69 | HR 74 | Resp 16 | Wt 169.0 lb

## 2017-02-18 DIAGNOSIS — Z89512 Acquired absence of left leg below knee: Secondary | ICD-10-CM

## 2017-02-18 DIAGNOSIS — Z794 Long term (current) use of insulin: Secondary | ICD-10-CM

## 2017-02-18 DIAGNOSIS — E119 Type 2 diabetes mellitus without complications: Secondary | ICD-10-CM

## 2017-02-18 DIAGNOSIS — E785 Hyperlipidemia, unspecified: Secondary | ICD-10-CM

## 2017-02-18 NOTE — Progress Notes (Signed)
Subjective:    Patient ID: [name], male    DOB: April 22, 1935, 82 y.o.   MRN: [id number] Chief Complaint  Patient presents with  . Follow-up    3wk    Patient presents for his first postoperative follow-up. 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 Recently hospitalized as an inpatient for stump infection. Treated with IV antibiotics as an inpatient. Seen by Dr. Lucky Cowboy in the hospital. He is now discharged him back at rehabilitation. He presents today without complaint. Denies any fever, nausea or vomiting. Denies any drainage from his stump site. Seen with his wife. Patient has been working with physical therapy at rehabilitation. States he has been using a walker but he is "weak".    Review of Systems  Constitutional: Negative.   HENT: Negative.   Eyes: Negative.   Respiratory: Negative.   Cardiovascular:       Left BKA  Gastrointestinal: Negative.   Endocrine: Negative.   Genitourinary: Negative.   Musculoskeletal: Negative.   Skin: Positive for wound.  Allergic/Immunologic: Negative.   Neurological: Negative.   Hematological: Negative.   Psychiatric/Behavioral: Negative.       Objective:   Physical Exam  Constitutional: He is oriented to person, place, and time. He appears well-developed and well-nourished. No distress.  HENT:  Head: Normocephalic and atraumatic.  Eyes: Pupils are equal, round, and reactive to light. Conjunctivae are normal.  Neck: Normal range of motion.  Cardiovascular: Normal rate, regular rhythm, normal heart sounds and intact distal pulses.   Pulses:      Radial pulses are 2+ on the right side, and 2+ on the left side.       Dorsalis pedis pulses are 1+ on the right side.       Posterior tibial pulses are 1+ on the right side.  Pulmonary/Chest: Effort normal.  Musculoskeletal: Normal range of motion. He exhibits no edema.  Neurological: He is alert and oriented to person, place, and time.  Skin: He is not diaphoretic.     Left BKA:  medial aspect of the wound dehisced. Fibrinous exudate noted. Lateral aspect intact with staples. No cellulitis. No erythema. No infection noted. No odor.  Psychiatric: He has a normal mood and affect. His behavior is normal. Judgment and thought content normal.  Vitals reviewed.   BP 112/69 (BP Location: Left Arm)   Pulse 74   Resp 16   Wt 169 lb (76.7 kg)   BMI 26.47 kg/m   Past Medical History:  Diagnosis Date  . Arthritis   . Atrial fibrillation ([place])   . Carcinoma of prostate ([place])   . CHF (congestive heart failure) ([place])   . Chronic kidney disease   . Diabetes mellitus without complication ([place])   . ED (erectile dysfunction)   . Frequent urination   . Hyperlipidemia   . Hypertension   . Moderate mitral insufficiency   . Peripheral vascular disease ([place])   . Pneumonia 04/2016  . Prostate cancer ([place])   . Sleep apnea    OSA--USE C-PAP  . Ulcer of left foot due to type 2 diabetes mellitus ([place])   . Urinary stress incontinence, male     Social History   Social History  . Marital status: Married    Spouse name: N/A  . Number of children: N/A  . Years of education: N/A   Occupational History  . retired    Social History Main Topics  . Smoking status: Former Smoker    Packs/day: 0.25  Types: Cigarettes    Quit date: 10/14/1994  . Smokeless tobacco: Never Used  . Alcohol use No  . Drug use: No  . Sexual activity: Not on file   Other Topics Concern  . Not on file   Social History Narrative  . No narrative on file    Past Surgical History:  Procedure Laterality Date  . AMPUTATION Left 01/12/2017   Procedure: AMPUTATION BELOW KNEE;  Surgeon: Algernon Huxley, MD;  Location: [facility];  Service: General;  Laterality: Left;  . APLIGRAFT PLACEMENT Left 10/15/2016   Procedure: APLIGRAFT PLACEMENT;  Surgeon: Albertine Patricia, DPM;  Location: [facility];  Service: Podiatry;  Laterality: Left;  necrotic ulcer  . CHOLECYSTECTOMY    . EYE SURGERY Bilateral    Cataract  Extraction with IOL  . IRRIGATION AND DEBRIDEMENT FOOT Left 10/15/2016   Procedure: IRRIGATION AND DEBRIDEMENT FOOT-EXCISIONAL DEBRIDEMENT OF SKIN 3RD, 4TH AND 5TH TOES WITH APPLICATION OF APLIGRAFT;  Surgeon: Albertine Patricia, DPM;  Location: [facility];  Service: Podiatry;  Laterality: Left;  necrotic,gangrene  . IRRIGATION AND DEBRIDEMENT FOOT Left 12/09/2016   Procedure: IRRIGATION AND DEBRIDEMENT FOOT-MUSCLE/FASCIA, RESECTION OF FOURTH AND FIFTH METATARSAL NECROTIC BONE;  Surgeon: Albertine Patricia, DPM;  Location: [facility];  Service: Podiatry;  Laterality: Left;  . LOWER EXTREMITY ANGIOGRAPHY Left 08/16/2016   Procedure: Lower Extremity Angiography;  Surgeon: Algernon Huxley, MD;  Location: [place] [facility];  Service: Cardiovascular;  Laterality: Left;  . LOWER EXTREMITY ANGIOGRAPHY Left 09/01/2016   Procedure: Lower Extremity Angiography;  Surgeon: Algernon Huxley, MD;  Location: [place] [facility];  Service: Cardiovascular;  Laterality: Left;  . LOWER EXTREMITY ANGIOGRAPHY Left 10/14/2016   Procedure: Lower Extremity Angiography;  Surgeon: Algernon Huxley, MD;  Location: [place] [facility];  Service: Cardiovascular;  Laterality: Left;  . LOWER EXTREMITY ANGIOGRAPHY Left 12/20/2016   Procedure: Lower Extremity Angiography;  Surgeon: Algernon Huxley, MD;  Location: [place] [facility];  Service: Cardiovascular;  Laterality: Left;  . LOWER EXTREMITY INTERVENTION  09/01/2016   Procedure: Lower Extremity Intervention;  Surgeon: Algernon Huxley, MD;  Location: [place] [facility];  Service: Cardiovascular;;  . PROSTATE SURGERY     removal  . TONSILLECTOMY     as a child    Family History  Problem Relation Age of Onset  . Hypertension Mother   . Diabetes Mother   . Prostate cancer Neg Hx   . Bladder Cancer Neg Hx   . Kidney disease Neg Hx     Allergies  Allergen Reactions  . Contrast Media [Iodinated Diagnostic Agents] Shortness Of Breath    SOB  . Iohexol Shortness Of Breath     Desc: Respiratory  Distress, Laryngedema, diaphoresis, Onset Date: [id number]   . Latex Rash       Assessment & Plan:  Patient presents for his first postoperative follow-up. 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 Recently hospitalized as an inpatient for stump infection. Treated with IV antibiotics as an inpatient. Seen by Dr. Lucky Cowboy in the hospital. He is now discharged him back at rehabilitation. He presents today without complaint. Denies any fever, nausea or vomiting. Denies any drainage from his stump site. Seen with his wife. Patient has been working with physical therapy at rehabilitation. States he has been using a walker but he is "weak".   1. S/P BKA (below knee amputation) unilateral, left ([place]) - new Patient recently released from [facility] after being admitted for IV  antibiotics due to a stump infection Medial aspect of stump dehisced with some fibrinous exudate this was gently debrided in the office today. I recommend and have informed his rehabilitation that they should apply calcium alginate tot medial aspect to help remove the soonest exudate that remains. We'll remove the lateral aspect of staples within the wound has healed as I do not want the rest of the stump to dehisce. Patient to follow up in 10 days for a wound check  2. Hyperlipidemia, unspecified hyperlipidemia type - Staple Encouraged good control as its slows the progression of atherosclerotic disease  3. Type 2 diabetes mellitus treated with insulin (HCC) - Stable Encouraged good control as its slows the progression of atherosclerotic disease  Current Outpatient Prescriptions on File Prior to Visit  Medication Sig Dispense Refill  . amoxicillin-clavulanate (AUGMENTIN) 875-125 MG tablet Take 1 tablet by mouth every 12 (twelve) hours. 14 tablet 0  . atorvastatin (LIPITOR) 80 MG tablet Take 80 mg by mouth daily at 6 PM.     . digoxin (LANOXIN) 0.125 MG tablet Take 1 tablet (0.125 mg total) by mouth daily.    Marland Kitchen  diltiazem (CARDIZEM CD) 300 MG 24 hr capsule Take 300 mg by mouth daily.    . furosemide (LASIX) 20 MG tablet Take 1 tablet (20 mg total) by mouth daily. 30 tablet 0  . insulin aspart (NOVOLOG) 100 UNIT/ML injection Inject 0-5 Units into the skin 3 (three) times daily with meals.     . insulin glargine (LANTUS) 100 UNIT/ML injection Inject 0.1 mLs (10 Units total) into the skin at bedtime. 10 mL 11  . metFORMIN (GLUCOPHAGE) 1000 MG tablet TAKE 1 TABLET TWICE A DAY WITH MEALS    . oxyCODONE (OXY IR/ROXICODONE) 5 MG immediate release tablet Take 1 tablet (5 mg total) by mouth every 6 (six) hours as needed for moderate pain. 30 tablet 0  . oxyCODONE (OXYCONTIN) 10 mg 12 hr tablet Take 1 tablet (10 mg total) by mouth every 12 (twelve) hours. 14 tablet 0  . silver sulfADIAZINE (SILVADENE) 1 % cream Apply topically daily. 50 g 0  . TOPROL XL 100 MG 24 hr tablet Take 100 mg by mouth daily.    . TRULICITY 1.88 CZ/6.6AY SOPN 0.75 mg once a week.     Current Facility-Administered Medications on File Prior to Visit  Medication Dose Route Frequency Provider Last Rate Last Dose  . acetaminophen (TYLENOL) tablet 650 mg  650 mg Oral Q6H PRN Xayvion Shirah A, PA-C       Or  . acetaminophen (TYLENOL) suppository 650 mg  650 mg Rectal Q6H PRN Aleiyah Halpin A, PA-C      . docusate sodium (COLACE) capsule 100 mg  100 mg Oral BID Alen Matheson A, PA-C      . heparin injection 5,000 Units  5,000 Units Subcutaneous Q8H Allessandra Bernardi A, PA-C      . ondansetron (ZOFRAN) tablet 4 mg  4 mg Oral Q6H PRN Morrison Masser A, PA-C       Or  . ondansetron (ZOFRAN) 4 mg in sodium chloride 0.9 % 50 mL IVPB  4 mg Intravenous Q6H PRN Ambera Fedele A, PA-C      . piperacillin-tazobactam (ZOSYN) 3.375 g in dextrose 5 % 50 mL IVPB  3.375 g Intravenous Q6H Sheilah Rayos A, PA-C      . senna (SENOKOT) tablet 8.6 mg  1 tablet Oral Daily PRN Konya Fauble A, PA-C        There  are no Patient  Instructions on file for this visit. No Follow-up on file.   Rennie Hack A Talib Headley, PA-C

## 2017-02-21 ENCOUNTER — Ambulatory Visit: Payer: Medicare Other | Admitting: Family

## 2017-03-01 ENCOUNTER — Ambulatory Visit (INDEPENDENT_AMBULATORY_CARE_PROVIDER_SITE_OTHER): Payer: Medicare Other | Admitting: Vascular Surgery

## 2017-03-01 ENCOUNTER — Encounter (INDEPENDENT_AMBULATORY_CARE_PROVIDER_SITE_OTHER): Payer: Self-pay | Admitting: Vascular Surgery

## 2017-03-01 VITALS — BP 105/72 | HR 79 | Resp 16

## 2017-03-01 DIAGNOSIS — E119 Type 2 diabetes mellitus without complications: Secondary | ICD-10-CM

## 2017-03-01 DIAGNOSIS — T879 Unspecified complications of amputation stump: Secondary | ICD-10-CM

## 2017-03-01 DIAGNOSIS — Z794 Long term (current) use of insulin: Secondary | ICD-10-CM

## 2017-03-01 DIAGNOSIS — Z89512 Acquired absence of left leg below knee: Secondary | ICD-10-CM

## 2017-03-01 NOTE — Progress Notes (Signed)
Subjective:    Patient ID: [name], male    DOB: 1934/08/03, 82 y.o.   MRN: [id number] Chief Complaint  Patient presents with  . Follow-up    10 day follow up   Patient presents today for a "10 day stump wound check". Patient was seen with his wife. Patient continues to reside at rehabilitation. He presents today without complaint. Denies any fever, nausea or vomiting. Denies minimal pain from the stump site. Denies any drainage from the stump site. He continues to work with physical therapy on a daily basis.    Review of Systems  Constitutional: Negative.   HENT: Negative.   Eyes: Negative.   Respiratory: Negative.   Cardiovascular: Negative.   Gastrointestinal: Negative.   Endocrine: Negative.   Genitourinary: Negative.   Musculoskeletal: Negative.   Skin: Positive for wound.  Allergic/Immunologic: Negative.   Neurological: Negative.   Hematological: Negative.   Psychiatric/Behavioral: Negative.       Objective:   Physical Exam  Constitutional: He is oriented to person, place, and time. He appears well-developed and well-nourished. No distress.  HENT:  Head: Normocephalic and atraumatic.  Eyes: Pupils are equal, round, and reactive to light. Conjunctivae are normal.  Neck: Normal range of motion.  Cardiovascular: Normal rate, regular rhythm, normal heart sounds and intact distal pulses.   Pulses:      Radial pulses are 2+ on the right side, and 2+ on the left side.  Pulmonary/Chest: Effort normal.  Musculoskeletal: Normal range of motion. He exhibits no edema.  Neurological: He is alert and oriented to person, place, and time.  Skin: He is not diaphoretic.     Left below the knee amputation: There is a dry but soft eschar noted to the medial aspect of the patient's wound. The surrounding skin is intact. There is no erythema to the stump. No erythema to the surrounding skin. No drainage is noted. Nontender to palpation. I removed the staples which were not  holding the wound together. They're about 5 staples left we'll remove these when I see him in 2 weeks to assure no further dehiscence of the wound occurs.  Psychiatric: He has a normal mood and affect. His behavior is normal. Judgment and thought content normal.  Vitals reviewed.  BP 105/72 (BP Location: Right Arm)   Pulse 79   Resp 16   Past Medical History:  Diagnosis Date  . Arthritis   . Atrial fibrillation ([place])   . Carcinoma of prostate ([place])   . CHF (congestive heart failure) ([place])   . Chronic kidney disease   . Diabetes mellitus without complication ([place])   . ED (erectile dysfunction)   . Frequent urination   . Hyperlipidemia   . Hypertension   . Moderate mitral insufficiency   . Peripheral vascular disease ([place])   . Pneumonia 04/2016  . Prostate cancer ([place])   . Sleep apnea    OSA--USE C-PAP  . Ulcer of left foot due to type 2 diabetes mellitus ([place])   . Urinary stress incontinence, male    Social History   Social History  . Marital status: Married    Spouse name: N/A  . Number of children: N/A  . Years of education: N/A   Occupational History  . retired    Social History Main Topics  . Smoking status: Former Smoker    Packs/day: 0.25    Types: Cigarettes    Quit date: 10/14/1994  . Smokeless tobacco: Never Used  . Alcohol use No  .  Drug use: No  . Sexual activity: Not on file   Other Topics Concern  . Not on file   Social History Narrative  . No narrative on file   Past Surgical History:  Procedure Laterality Date  . AMPUTATION Left 01/12/2017   Procedure: AMPUTATION BELOW KNEE;  Surgeon: Algernon Huxley, MD;  Location: [facility];  Service: General;  Laterality: Left;  . APLIGRAFT PLACEMENT Left 10/15/2016   Procedure: APLIGRAFT PLACEMENT;  Surgeon: Albertine Patricia, DPM;  Location: [facility];  Service: Podiatry;  Laterality: Left;  necrotic ulcer  . CHOLECYSTECTOMY    . EYE SURGERY Bilateral    Cataract Extraction with IOL  . IRRIGATION AND  DEBRIDEMENT FOOT Left 10/15/2016   Procedure: IRRIGATION AND DEBRIDEMENT FOOT-EXCISIONAL DEBRIDEMENT OF SKIN 3RD, 4TH AND 5TH TOES WITH APPLICATION OF APLIGRAFT;  Surgeon: Albertine Patricia, DPM;  Location: [facility];  Service: Podiatry;  Laterality: Left;  necrotic,gangrene  . IRRIGATION AND DEBRIDEMENT FOOT Left 12/09/2016   Procedure: IRRIGATION AND DEBRIDEMENT FOOT-MUSCLE/FASCIA, RESECTION OF FOURTH AND FIFTH METATARSAL NECROTIC BONE;  Surgeon: Albertine Patricia, DPM;  Location: [facility];  Service: Podiatry;  Laterality: Left;  . LOWER EXTREMITY ANGIOGRAPHY Left 08/16/2016   Procedure: Lower Extremity Angiography;  Surgeon: Algernon Huxley, MD;  Location: [place] [facility];  Service: Cardiovascular;  Laterality: Left;  . LOWER EXTREMITY ANGIOGRAPHY Left 09/01/2016   Procedure: Lower Extremity Angiography;  Surgeon: Algernon Huxley, MD;  Location: [place] [facility];  Service: Cardiovascular;  Laterality: Left;  . LOWER EXTREMITY ANGIOGRAPHY Left 10/14/2016   Procedure: Lower Extremity Angiography;  Surgeon: Algernon Huxley, MD;  Location: [place] [facility];  Service: Cardiovascular;  Laterality: Left;  . LOWER EXTREMITY ANGIOGRAPHY Left 12/20/2016   Procedure: Lower Extremity Angiography;  Surgeon: Algernon Huxley, MD;  Location: [place] [facility];  Service: Cardiovascular;  Laterality: Left;  . LOWER EXTREMITY INTERVENTION  09/01/2016   Procedure: Lower Extremity Intervention;  Surgeon: Algernon Huxley, MD;  Location: [place] [facility];  Service: Cardiovascular;;  . PROSTATE SURGERY     removal  . TONSILLECTOMY     as a child   Family History  Problem Relation Age of Onset  . Hypertension Mother   . Diabetes Mother   . Prostate cancer Neg Hx   . Bladder Cancer Neg Hx   . Kidney disease Neg Hx    Allergies  Allergen Reactions  . Contrast Media [Iodinated Diagnostic Agents] Shortness Of Breath    SOB  . Iohexol Shortness Of Breath     Desc: Respiratory Distress, Laryngedema, diaphoresis, Onset  Date: [id number]   . Latex Rash      Assessment & Plan:  Patient presents today for a "10 day stump wound check". Patient was seen with his wife. Patient continues to reside at rehabilitation. He presents today without complaint. Denies any fever, nausea or vomiting. Denies minimal pain from the stump site. Denies any drainage from the stump site. He continues to work with physical therapy on a daily basis.   1. S/P BKA (below knee amputation) unilateral, left ([place]) - Stable Patient is status post left below the knee amputation on 01/12/17 Patient admitted for IV antibiotics due to stump infection on 02/10/2017  2. BKA stump complication (HCC) - stable Improvement to the stump medial aspect wound dehiscence. Patient is asymptomatic. Wound is dry. Surrounding skin is healthy and non-erythematous. There is no edema to the stump. I will see the patient back in 2 weeks for continued  close monitoring of his stump. Patient to continue physical therapy as per rehabilitation recommendations Patient to call the office sooner if she should experience any worsening pain, discharge or signs of slow or nonhealing to the stump.  3. Type 2 diabetes mellitus treated with insulin (HCC)  - Stable Encouraged good control as its slows the progression of atherosclerotic disease and an impedance wound healing  Current Outpatient Prescriptions on File Prior to Visit  Medication Sig Dispense Refill  . atorvastatin (LIPITOR) 80 MG tablet Take 80 mg by mouth daily at 6 PM.     . digoxin (LANOXIN) 0.125 MG tablet Take 1 tablet (0.125 mg total) by mouth daily.    Marland Kitchen diltiazem (CARDIZEM CD) 300 MG 24 hr capsule Take 300 mg by mouth daily.    . furosemide (LASIX) 20 MG tablet Take 1 tablet (20 mg total) by mouth daily. 30 tablet 0  . insulin aspart (NOVOLOG) 100 UNIT/ML injection Inject 0-5 Units into the skin 3 (three) times daily with meals.     . insulin glargine (LANTUS) 100 UNIT/ML injection Inject 0.1 mLs (10  Units total) into the skin at bedtime. 10 mL 11  . metFORMIN (GLUCOPHAGE) 1000 MG tablet TAKE 1 TABLET TWICE A DAY WITH MEALS    . oxyCODONE (OXY IR/ROXICODONE) 5 MG immediate release tablet Take 1 tablet (5 mg total) by mouth every 6 (six) hours as needed for moderate pain. 30 tablet 0  . oxyCODONE (OXYCONTIN) 10 mg 12 hr tablet Take 1 tablet (10 mg total) by mouth every 12 (twelve) hours. 14 tablet 0  . silver sulfADIAZINE (SILVADENE) 1 % cream Apply topically daily. 50 g 0  . TOPROL XL 100 MG 24 hr tablet Take 100 mg by mouth daily.    . TRULICITY 3.71 GG/2.6RS SOPN 0.75 mg once a week.     Current Facility-Administered Medications on File Prior to Visit  Medication Dose Route Frequency Provider Last Rate Last Dose  . acetaminophen (TYLENOL) tablet 650 mg  650 mg Oral Q6H PRN Peregrine Nolt A, PA-C       Or  . acetaminophen (TYLENOL) suppository 650 mg  650 mg Rectal Q6H PRN Shykeria Sakamoto A, PA-C      . docusate sodium (COLACE) capsule 100 mg  100 mg Oral BID Wendelin Reader A, PA-C      . heparin injection 5,000 Units  5,000 Units Subcutaneous Q8H Fatma Rutten A, PA-C      . ondansetron (ZOFRAN) tablet 4 mg  4 mg Oral Q6H PRN Jewell Haught A, PA-C       Or  . ondansetron (ZOFRAN) 4 mg in sodium chloride 0.9 % 50 mL IVPB  4 mg Intravenous Q6H PRN Jurnie Garritano A, PA-C      . piperacillin-tazobactam (ZOSYN) 3.375 g in dextrose 5 % 50 mL IVPB  3.375 g Intravenous Q6H Jamilyn Pigeon A, PA-C      . senna (SENOKOT) tablet 8.6 mg  1 tablet Oral Daily PRN Taima Rada A, PA-C        There are no Patient Instructions on file for this visit. No Follow-up on file.   Savilla Turbyfill A Tony Granquist, PA-C

## 2017-03-11 ENCOUNTER — Ambulatory Visit (INDEPENDENT_AMBULATORY_CARE_PROVIDER_SITE_OTHER): Payer: Medicare Other | Admitting: Vascular Surgery

## 2017-03-14 ENCOUNTER — Ambulatory Visit (INDEPENDENT_AMBULATORY_CARE_PROVIDER_SITE_OTHER): Payer: Medicare Other | Admitting: Vascular Surgery

## 2017-03-14 ENCOUNTER — Encounter (INDEPENDENT_AMBULATORY_CARE_PROVIDER_SITE_OTHER): Payer: Self-pay | Admitting: Vascular Surgery

## 2017-03-14 VITALS — BP 134/83 | HR 68 | Resp 16

## 2017-03-14 DIAGNOSIS — E785 Hyperlipidemia, unspecified: Secondary | ICD-10-CM

## 2017-03-14 DIAGNOSIS — E119 Type 2 diabetes mellitus without complications: Secondary | ICD-10-CM

## 2017-03-14 DIAGNOSIS — Z794 Long term (current) use of insulin: Secondary | ICD-10-CM

## 2017-03-14 DIAGNOSIS — Z89512 Acquired absence of left leg below knee: Secondary | ICD-10-CM

## 2017-03-14 NOTE — Progress Notes (Signed)
Subjective:    Patient ID: [name], male    DOB: Jul 08, 1934, 82 y.o.   MRN: [id number] Chief Complaint  Patient presents with  . Follow-up   Presents for a one week wound check. Patient presents without complaint. He continues to receive local wound care from rehabilitation. He continues to receive inpatient physical therapy. Seen with wife. Denies any issues with his stump over the last week. Denies any increased pain to the stump area. Denies any drainage from his stump. Denies any fever, nausea or vomiting.   Review of Systems  Constitutional: Negative.   HENT: Negative.   Eyes: Negative.   Respiratory: Negative.   Cardiovascular: Negative.   Gastrointestinal: Negative.   Endocrine: Negative.   Genitourinary: Negative.   Musculoskeletal: Negative.   Skin: Positive for wound.  Allergic/Immunologic: Negative.   Neurological: Negative.   Hematological: Negative.   Psychiatric/Behavioral: Negative.       Objective:   Physical Exam  Constitutional: He is oriented to person, place, and time. He appears well-developed and well-nourished. No distress.  HENT:  Head: Normocephalic and atraumatic.  Eyes: Pupils are equal, round, and reactive to light. Conjunctivae are normal.  Neck: Normal range of motion.  Cardiovascular: Normal rate, regular rhythm, normal heart sounds and intact distal pulses.   Pulmonary/Chest: Effort normal.  Musculoskeletal: He exhibits no edema.  Neurological: He is alert and oriented to person, place, and time.  Skin: He is not diaphoretic.  Left below the knee amputation: There is a dry but soft eschar noted to the medial aspect of the patient's wound. The surrounding skin is intact. There is no erythema to the stump. No erythema to the surrounding skin. No drainage is noted. Nontender to palpation. I removed the the remaining staples without issue. There is no drainage from the wound. No cellulitis.    Psychiatric: He has a normal mood and  affect. His behavior is normal. Judgment and thought content normal.  Vitals reviewed.  BP 134/83 (BP Location: Left Arm)   Pulse 68   Resp 16   Past Medical History:  Diagnosis Date  . Arthritis   . Atrial fibrillation ([place])   . Carcinoma of prostate ([place])   . CHF (congestive heart failure) ([place])   . Chronic kidney disease   . Diabetes mellitus without complication ([place])   . ED (erectile dysfunction)   . Frequent urination   . Hyperlipidemia   . Hypertension   . Moderate mitral insufficiency   . Peripheral vascular disease ([place])   . Pneumonia 04/2016  . Prostate cancer ([place])   . Sleep apnea    OSA--USE C-PAP  . Ulcer of left foot due to type 2 diabetes mellitus ([place])   . Urinary stress incontinence, male    Social History   Social History  . Marital status: Married    Spouse name: N/A  . Number of children: N/A  . Years of education: N/A   Occupational History  . retired    Social History Main Topics  . Smoking status: Former Smoker    Packs/day: 0.25    Types: Cigarettes    Quit date: 10/14/1994  . Smokeless tobacco: Never Used  . Alcohol use No  . Drug use: No  . Sexual activity: Not on file   Other Topics Concern  . Not on file   Social History Narrative  . No narrative on file   Past Surgical History:  Procedure Laterality Date  . AMPUTATION Left 01/12/2017   Procedure:  AMPUTATION BELOW KNEE;  Surgeon: Algernon Huxley, MD;  Location: [facility];  Service: General;  Laterality: Left;  . APLIGRAFT PLACEMENT Left 10/15/2016   Procedure: APLIGRAFT PLACEMENT;  Surgeon: Albertine Patricia, DPM;  Location: [facility];  Service: Podiatry;  Laterality: Left;  necrotic ulcer  . CHOLECYSTECTOMY    . EYE SURGERY Bilateral    Cataract Extraction with IOL  . IRRIGATION AND DEBRIDEMENT FOOT Left 10/15/2016   Procedure: IRRIGATION AND DEBRIDEMENT FOOT-EXCISIONAL DEBRIDEMENT OF SKIN 3RD, 4TH AND 5TH TOES WITH APPLICATION OF APLIGRAFT;  Surgeon: Albertine Patricia, DPM;   Location: [facility];  Service: Podiatry;  Laterality: Left;  necrotic,gangrene  . IRRIGATION AND DEBRIDEMENT FOOT Left 12/09/2016   Procedure: IRRIGATION AND DEBRIDEMENT FOOT-MUSCLE/FASCIA, RESECTION OF FOURTH AND FIFTH METATARSAL NECROTIC BONE;  Surgeon: Albertine Patricia, DPM;  Location: [facility];  Service: Podiatry;  Laterality: Left;  . LOWER EXTREMITY ANGIOGRAPHY Left 08/16/2016   Procedure: Lower Extremity Angiography;  Surgeon: Algernon Huxley, MD;  Location: [facility];  Service: Cardiovascular;  Laterality: Left;  . LOWER EXTREMITY ANGIOGRAPHY Left 09/01/2016   Procedure: Lower Extremity Angiography;  Surgeon: Algernon Huxley, MD;  Location: [facility];  Service: Cardiovascular;  Laterality: Left;  . LOWER EXTREMITY ANGIOGRAPHY Left 10/14/2016   Procedure: Lower Extremity Angiography;  Surgeon: Algernon Huxley, MD;  Location: [facility];  Service: Cardiovascular;  Laterality: Left;  . LOWER EXTREMITY ANGIOGRAPHY Left 12/20/2016   Procedure: Lower Extremity Angiography;  Surgeon: Algernon Huxley, MD;  Location: [facility];  Service: Cardiovascular;  Laterality: Left;  . LOWER EXTREMITY INTERVENTION  09/01/2016   Procedure: Lower Extremity Intervention;  Surgeon: Algernon Huxley, MD;  Location: [facility];  Service: Cardiovascular;;  . PROSTATE SURGERY     removal  . TONSILLECTOMY     as a child   Family History  Problem Relation Age of Onset  . Hypertension Mother   . Diabetes Mother   . Prostate cancer Neg Hx   . Bladder Cancer Neg Hx   . Kidney disease Neg Hx    Allergies  Allergen Reactions  . Contrast Media [Iodinated Diagnostic Agents] Shortness Of Breath    SOB  . Iohexol Shortness Of Breath     Desc: Respiratory Distress, Laryngedema, diaphoresis, Onset Date: [id number]   . Latex Rash      Assessment & Plan:  Presents for a one week wound check. Patient presents without complaint. He continues to receive local wound care from rehabilitation.  He continues to receive inpatient physical therapy. Seen with wife. Denies any issues with his stump over the last week. Denies any increased pain to the stump area. Denies any drainage from his stump. Denies any fever, nausea or vomiting.  1. S/P BKA (below knee amputation) unilateral, left (H[facility]) - Stable No change in physical exam. I have removed the remaining staples without issue. Patient to continue local wound care / physical therapy as per rehabilitation recommendations I will see the patient back in 1 month for a wound check. I will order a shrinker sock once the patient's stump has healed. The wife and the patient are in agreement. Patient is able to straighten his knee appropriately at this time. He understands he must practice [place] on a daily basis Patient to call the office sooner if there should be any decline to the wound.  2. Type 2 diabetes mellitus treated with insulin ([place]) - Stable Encouraged good control as its slows the progression  of atherosclerotic disease  3. Hyperlipidemia, unspecified hyperlipidemia type - Stable Encouraged good control as its slows the progression of atherosclerotic disease  Current Outpatient Prescriptions on File Prior to Visit  Medication Sig Dispense Refill  . atorvastatin (LIPITOR) 80 MG tablet Take 80 mg by mouth daily at 6 PM.     . digoxin (LANOXIN) 0.125 MG tablet Take 1 tablet (0.125 mg total) by mouth daily.    Marland Kitchen diltiazem (CARDIZEM CD) 300 MG 24 hr capsule Take 300 mg by mouth daily.    . furosemide (LASIX) 20 MG tablet Take 1 tablet (20 mg total) by mouth daily. 30 tablet 0  . insulin aspart (NOVOLOG) 100 UNIT/ML injection Inject 0-5 Units into the skin 3 (three) times daily with meals.     . insulin glargine (LANTUS) 100 UNIT/ML injection Inject 0.1 mLs (10 Units total) into the skin at bedtime. 10 mL 11  . metFORMIN (GLUCOPHAGE) 1000 MG tablet TAKE 1 TABLET TWICE A DAY WITH MEALS    . oxyCODONE (OXY IR/ROXICODONE) 5 MG immediate  release tablet Take 1 tablet (5 mg total) by mouth every 6 (six) hours as needed for moderate pain. 30 tablet 0  . oxyCODONE (OXYCONTIN) 10 mg 12 hr tablet Take 1 tablet (10 mg total) by mouth every 12 (twelve) hours. 14 tablet 0  . silver sulfADIAZINE (SILVADENE) 1 % cream Apply topically daily. 50 g 0  . TOPROL XL 100 MG 24 hr tablet Take 100 mg by mouth daily.    . TRULICITY 5.91 MB/8.4YK SOPN 0.75 mg once a week.     Current Facility-Administered Medications on File Prior to Visit  Medication Dose Route Frequency Provider Last Rate Last Dose  . acetaminophen (TYLENOL) tablet 650 mg  650 mg Oral Q6H PRN Gwendolen Hewlett A, PA-C       Or  . acetaminophen (TYLENOL) suppository 650 mg  650 mg Rectal Q6H PRN Nana Hoselton A, PA-C      . docusate sodium (COLACE) capsule 100 mg  100 mg Oral BID Christien Berthelot A, PA-C      . heparin injection 5,000 Units  5,000 Units Subcutaneous Q8H Ajanae Virag A, PA-C      . ondansetron (ZOFRAN) tablet 4 mg  4 mg Oral Q6H PRN Apollos Tenbrink A, PA-C       Or  . ondansetron (ZOFRAN) 4 mg in sodium chloride 0.9 % 50 mL IVPB  4 mg Intravenous Q6H PRN Markes Shatswell A, PA-C      . piperacillin-tazobactam (ZOSYN) 3.375 g in dextrose 5 % 50 mL IVPB  3.375 g Intravenous Q6H Isley Zinni A, PA-C      . senna (SENOKOT) tablet 8.6 mg  1 tablet Oral Daily PRN Lashayla Armes A, PA-C        There are no Patient Instructions on file for this visit. No Follow-up on file.   Vennesa Bastedo A Tahja Liao, PA-C

## 2017-04-22 ENCOUNTER — Encounter (INDEPENDENT_AMBULATORY_CARE_PROVIDER_SITE_OTHER): Payer: Self-pay | Admitting: Vascular Surgery

## 2017-04-22 ENCOUNTER — Ambulatory Visit (INDEPENDENT_AMBULATORY_CARE_PROVIDER_SITE_OTHER): Payer: Medicare Other | Admitting: Vascular Surgery

## 2017-04-22 VITALS — BP 140/80 | HR 63 | Resp 16 | Wt 170.0 lb

## 2017-04-22 DIAGNOSIS — Z89512 Acquired absence of left leg below knee: Secondary | ICD-10-CM | POA: Diagnosis not present

## 2017-04-22 DIAGNOSIS — I70269 Atherosclerosis of native arteries of extremities with gangrene, unspecified extremity: Secondary | ICD-10-CM

## 2017-04-22 DIAGNOSIS — I482 Chronic atrial fibrillation, unspecified: Secondary | ICD-10-CM

## 2017-04-22 DIAGNOSIS — I10 Essential (primary) hypertension: Secondary | ICD-10-CM | POA: Diagnosis not present

## 2017-04-22 DIAGNOSIS — Z794 Long term (current) use of insulin: Secondary | ICD-10-CM | POA: Diagnosis not present

## 2017-04-22 DIAGNOSIS — E785 Hyperlipidemia, unspecified: Secondary | ICD-10-CM | POA: Diagnosis not present

## 2017-04-22 DIAGNOSIS — E119 Type 2 diabetes mellitus without complications: Secondary | ICD-10-CM | POA: Diagnosis not present

## 2017-04-22 NOTE — Assessment & Plan Note (Signed)
With the exception of a very small scab on the medial aspect measuring only about 5 mm in width and a few millimeters in length, the wound is completely healed without signs of infection. At this point, I think we can begin the process of getting him a prosthesis.  I have put in a referral to biotech.  I will plan to see him back in 3-4 months.

## 2017-04-22 NOTE — Assessment & Plan Note (Signed)
Status post left BKA doing well.

## 2017-04-22 NOTE — Patient Instructions (Signed)
Stump and Prosthesis Care °When an arm or leg is removed, it is important to care for the artificial body part that replaces it (prosthesis) and for the remaining end of the arm or leg (stump). Caring for the stump and prosthesis will help you to be comfortable, active, and healthy. °How to care for your stump °Cleaning Your Skin °· Wash your stump with a mild antibacterial soap at least once per day. °· Wash your stump after getting dirty or sweaty. °· After washing your stump, pat it dry and let it air-dry for 5-10 minutes. °· Do not soak your stump in a warm or hot bath for longer than 20 minutes at a time. °· Avoid shaving hair on the stump. Hair that grows out after being shaved is more easily irritated by the prosthesis. °Using Skin Care Products °· Apply ointment to your surgical scar if your health care provider instructed you to do so. This can keep the scar soft and help it heal. °· Do not put creams and lotions on your stump unless your health care provider says it is okay. If your health care provider says it is okay to put creams and lotions on your stump, do not use lotions that contain petroleum jelly. °· Do not use skin care products with an alcohol base. These products can be harmful to your skin. They can also damage the lining of the prosthesis. °· Consider using an antiperspirant spray on the skin of the stump if you are prone to sweating. °Other Instructions °· Every day, look closely at the skin on your stump. Use a mirror with a long handle to check areas you cannot see, or ask a friend or family member to check those areas. Look for areas that appear reddish, swollen, or irritated. Pay extra attention to places where the stump and prosthesis rub together. °How to care for your prosthesis °Cleaning Your Prosthesis °· Use hot water and antibacterial soap to wash your prosthesis. °Attaching Your Prosthesis °· Make sure your prosthesis is clean before you attach it to your stump. All the parts  that touch your skin should be clean and dry. °· Be sure you understand how to attach the prosthesis. A prosthetic specialist (prosthetist) can show you how to do this. It is a good idea to practice several times while he or she watches. °· If you were given wraps or socks to wear under the prosthesis, make sure to wear them. °Other Instructions °· Exercise and move your prosthesis as recommended by your physical therapist. °· Follow your health care provider's instructions about the length of time you should wear your prosthesis. You will likely need to limit the amount of time you wear your prosthesis at first. You may be instructed to increase the time you wear your prosthesis a little bit each day. °Contact a health care provider if: °· The prosthesis does not seem to fit correctly. °· You have an itchy rash or a sore on your stump. °· Sweating between the stump and the prosthesis is heavy and efforts to control the sweating do not work. °Get help right away if: °· Your stump is red, swollen, painful to the touch, or hot. °· A bad smell develops around the stump. °· There is a sore on your stump that is not healing. °· Your stump is colder than the upper part of the limb. °· Skin on your stump turns gray or black. °· There is any drainage coming from your stump. °This information   is not intended to replace advice given to you by your health care provider. Make sure you discuss any questions you have with your health care provider. °Document Released: 08/04/2009 Document Revised: 01/04/2016 Document Reviewed: 05/06/2014 °Elsevier Interactive Patient Education © 2018 [employer] ° °

## 2017-04-22 NOTE — Progress Notes (Signed)
MRN : [id number]  [name] is a 82 y.o. ([birth year]) male who presents with chief complaint of  Chief Complaint  Patient presents with  . Wound Check    8monthfollow up  .  History of Present Illness: Patient returns today in follow up of his left below-knee amputation.  He is a little over 3 months status post left below-knee amputation.  He actually is doing quite well.  When asked about pain he has none.  He had debilitating pain for months before he would agree to an amputation.  With the exception of a very small scab on the medial aspect measuring only about 5 mm in width and a few millimeters in length, the wound is completely healed without signs of infection.  Current Outpatient Medications  Medication Sig Dispense Refill  . atorvastatin (LIPITOR) 80 MG tablet Take 80 mg by mouth daily at 6 PM.     . digoxin (LANOXIN) 0.125 MG tablet Take 1 tablet (0.125 mg total) by mouth daily.    .Marland Kitchendiltiazem (CARDIZEM CD) 300 MG 24 hr capsule Take 300 mg by mouth daily.    . furosemide (LASIX) 20 MG tablet Take 1 tablet (20 mg total) by mouth daily. 30 tablet 0  . insulin aspart (NOVOLOG) 100 UNIT/ML injection Inject 0-5 Units into the skin 3 (three) times daily with meals.     . insulin glargine (LANTUS) 100 UNIT/ML injection Inject 0.1 mLs (10 Units total) into the skin at bedtime. 10 mL 11  . metFORMIN (GLUCOPHAGE) 1000 MG tablet TAKE 1 TABLET TWICE A DAY WITH MEALS    . oxyCODONE (OXY IR/ROXICODONE) 5 MG immediate release tablet Take 1 tablet (5 mg total) by mouth every 6 (six) hours as needed for moderate pain. 30 tablet 0  . oxyCODONE (OXYCONTIN) 10 mg 12 hr tablet Take 1 tablet (10 mg total) by mouth every 12 (twelve) hours. 14 tablet 0  . silver sulfADIAZINE (SILVADENE) 1 % cream Apply topically daily. 50 g 0  . TOPROL XL 100 MG 24 hr tablet Take 100 mg by mouth daily.    . TRULICITY 02.94MTM/5.4YTSOPN 0.75 mg once a week.     Current Facility-Administered Medications    Medication Dose Route Frequency Provider Last Rate Last Dose  . docusate sodium (COLACE) capsule 100 mg  100 mg Oral BID Stegmayer, Kimberly A, PA-C      . heparin injection 5,000 Units  5,000 Units Subcutaneous Q8H Stegmayer, Kimberly A, PA-C      . piperacillin-tazobactam (ZOSYN) 3.375 g in dextrose 5 % 50 mL IVPB  3.375 g Intravenous Q6H Stegmayer, Kimberly A, PA-C      . senna (SENOKOT) tablet 8.6 mg  1 tablet Oral Daily PRN Stegmayer, Kimberly A, PA-C       Facility-Administered Medications Ordered in Other Visits  Medication Dose Route Frequency Provider Last Rate Last Dose  . acetaminophen (TYLENOL) tablet 650 mg  650 mg Oral Q6H PRN Stegmayer, Kimberly A, PA-C       Or  . acetaminophen (TYLENOL) suppository 650 mg  650 mg Rectal Q6H PRN Stegmayer, Kimberly A, PA-C      . ondansetron (ZOFRAN) tablet 4 mg  4 mg Oral Q6H PRN Stegmayer, Kimberly A, PA-C       Or  . ondansetron (ZOFRAN) 4 mg in sodium chloride 0.9 % 50 mL IVPB  4 mg Intravenous Q6H PRN Stegmayer, KAbbe [place]       Past Medical  History:  Diagnosis Date  . Arthritis   . Atrial fibrillation ([place])   . Carcinoma of prostate ([place])   . CHF (congestive heart failure) ([place])   . Chronic kidney disease   . Diabetes mellitus without complication ([place])   . ED (erectile dysfunction)   . Frequent urination   . Hyperlipidemia   . Hypertension   . Moderate mitral insufficiency   . Peripheral vascular disease ([place])   . Pneumonia 04/2016  . Prostate cancer ([place])   . Sleep apnea    OSA--USE C-PAP  . Ulcer of left foot due to type 2 diabetes mellitus ([place])   . Urinary stress incontinence, male     Past Surgical History:  Procedure Laterality Date  . AMPUTATION Left 01/12/2017   Procedure: AMPUTATION BELOW KNEE;  Surgeon: Algernon Huxley, MD;  Location: [facility] ORS;  Service: General;  Laterality: Left;  . APLIGRAFT PLACEMENT Left 10/15/2016   Procedure: APLIGRAFT PLACEMENT;  Surgeon: Albertine Patricia, DPM;  Location: [facility]  ORS;  Service: Podiatry;  Laterality: Left;  necrotic ulcer  . CHOLECYSTECTOMY    . EYE SURGERY Bilateral    Cataract Extraction with IOL  . IRRIGATION AND DEBRIDEMENT FOOT Left 10/15/2016   Procedure: IRRIGATION AND DEBRIDEMENT FOOT-EXCISIONAL DEBRIDEMENT OF SKIN 3RD, 4TH AND 5TH TOES WITH APPLICATION OF APLIGRAFT;  Surgeon: Albertine Patricia, DPM;  Location: [facility] ORS;  Service: Podiatry;  Laterality: Left;  necrotic,gangrene  . IRRIGATION AND DEBRIDEMENT FOOT Left 12/09/2016   Procedure: IRRIGATION AND DEBRIDEMENT FOOT-MUSCLE/FASCIA, RESECTION OF FOURTH AND FIFTH METATARSAL NECROTIC BONE;  Surgeon: Albertine Patricia, DPM;  Location: [facility] ORS;  Service: Podiatry;  Laterality: Left;  . LOWER EXTREMITY ANGIOGRAPHY Left 08/16/2016   Procedure: Lower Extremity Angiography;  Surgeon: Algernon Huxley, MD;  Location: [place] [facility];  Service: Cardiovascular;  Laterality: Left;  . LOWER EXTREMITY ANGIOGRAPHY Left 09/01/2016   Procedure: Lower Extremity Angiography;  Surgeon: Algernon Huxley, MD;  Location: [place] [facility];  Service: Cardiovascular;  Laterality: Left;  . LOWER EXTREMITY ANGIOGRAPHY Left 10/14/2016   Procedure: Lower Extremity Angiography;  Surgeon: Algernon Huxley, MD;  Location: [place] [facility];  Service: Cardiovascular;  Laterality: Left;  . LOWER EXTREMITY ANGIOGRAPHY Left 12/20/2016   Procedure: Lower Extremity Angiography;  Surgeon: Algernon Huxley, MD;  Location: [place] [facility];  Service: Cardiovascular;  Laterality: Left;  . LOWER EXTREMITY INTERVENTION  09/01/2016   Procedure: Lower Extremity Intervention;  Surgeon: Algernon Huxley, MD;  Location: [place] [facility];  Service: Cardiovascular;;  . PROSTATE SURGERY     removal  . TONSILLECTOMY     as a child    Family History  Problem Relation Age of Onset  . Prostate cancer Neg Hx   . Bladder Cancer Neg Hx   . Kidney disease Neg Hx   No bleeding disorders, clotting disorders, autoimmune diseases, or aneurysms  Social  History      Social History  Substance Use Topics  . Smoking status: Former Smoker    Quit date: 10/14/1994  . Smokeless tobacco: Never Used  . Alcohol use No  Married, wife accompanies today Son and daughter are also present today.       Allergies  Allergen Reactions  . Contrast Media [Iodinated Diagnostic Agents]     SOB  . Iohexol     Desc: Respiratory Distress, Laryngedema, diaphoresis, Onset Date: [id number]   . Latex Rash       REVIEW OF SYSTEMS(Negative unless checked)  Constitutional: '[]' Weight  loss'[]' Fever'[]' Chills Cardiac:'[]' Chest pain'[]' Chest pressure'[x]' Palpitations '[]' Shortness of breath when laying flat '[]' Shortness of breath at rest '[x]' Shortness of breath with exertion. Vascular: '[x]' Pain in legs with walking'[]' Pain in legsat rest'[]' Pain in legs when laying flat '[]' Claudication '[x]' Pain in feet when walking '[x]' Pain in feet at rest '[x]' Pain in feet when laying flat '[]' History of DVT '[]' Phlebitis '[x]' Swelling in legs '[]' Varicose veins '[x]' Non-healing ulcers Pulmonary: '[]' Uses home oxygen '[x]' Productive cough'[]' Hemoptysis '[]' Wheeze '[]' COPD '[]' Asthma Neurologic: '[]' Dizziness '[]' Blackouts '[]' Seizures '[]' History of stroke '[]' History of TIA'[]' Aphasia '[]' Temporary blindness'[]' Dysphagia '[]' Weaknessor numbness in arms '[]' Weakness or numbnessin legs Musculoskeletal: '[x]' Arthritis '[]' Joint swelling '[]' Joint pain '[]' Low back pain Hematologic:'[]' Easy bruising'[]' Easy bleeding '[]' Hypercoagulable state '[]' Anemic '[]' Hepatitis Gastrointestinal:'[]' Blood in stool'[]' Vomiting blood'[]' Gastroesophageal reflux/heartburn'[]' Abdominal pain Genitourinary: '[]' Chronic kidney disease '[]' Difficulturination '[]' Frequenturination '[]' Burning with urination'[]' Hematuria Skin: '[]' Rashes '[x]' Ulcers '[x]' Wounds Psychological: '[]' History of anxiety'[]' History of major depression.      Physical Examination  BP 140/80  (BP Location: Left Arm)   Pulse 63   Resp 16   Wt 77.1 kg (170 lb)   BMI 26.63 kg/m  Gen:  WD/WN, NAD Head: [place]/AT, No temporalis wasting. Ear/Nose/Throat: Hearing grossly intact, nares w/o erythema or drainage, trachea midline Eyes: Conjunctiva clear. Sclera non-icteric Neck: Supple.  No JVD.  Pulmonary:  Good air movement, no use of accessory muscles.  Cardiac: Irregular Vascular:  Vessel Right Left  Radial Palpable Palpable                                    Musculoskeletal: M/S 5/5 throughout.  Uses a wheelchair.  Left BKA Neurologic: Sensation grossly intact in extremities.  Symmetrical.  Speech is fluent.  Psychiatric: Judgment intact, Mood & affect appropriate for pt's clinical situation. Dermatologic: With the exception of a very small scab on the medial aspect measuring only about 5 mm in width and a few millimeters in length, the wound is completely healed without signs of infection.      Labs Recent Results (from the past 2160 hour(s))  Glucose, capillary     Status: Abnormal   Collection Time: 01/27/17  3:22 PM  Result Value Ref Range   Glucose-Capillary 152 (H) 65 - 99 mg/dL  CBC     Status: Abnormal   Collection Time: 01/27/17  3:35 PM  Result Value Ref Range   WBC 10.9 (H) 3.8 - 10.6 K/uL   RBC 4.51 4.40 - 5.90 MIL/uL   Hemoglobin 12.7 (L) 13.0 - 18.0 g/dL   HCT 37.5 (L) 40.0 - 52.0 %   MCV 83.3 80.0 - 100.0 fL   MCH 28.1 26.0 - 34.0 pg   MCHC 33.8 32.0 - 36.0 g/dL   RDW 16.7 (H) 11.5 - 14.5 %   Platelets 392 150 - 440 K/uL  Basic metabolic panel     Status: Abnormal   Collection Time: 01/27/17  3:35 PM  Result Value Ref Range   Sodium 135 135 - 145 mmol/L   Potassium 3.8 3.5 - 5.1 mmol/L   Chloride 97 (L) 101 - 111 mmol/L   CO2 27 22 - 32 mmol/L   Glucose, Bld 163 (H) 65 - 99 mg/dL   BUN 22 (H) 6 - 20 mg/dL   Creatinine, Ser 0.91 0.61 - 1.24 mg/dL   Calcium 8.7 (L) 8.9 - 10.3 mg/dL   GFR calc non Af Amer >60 >60 mL/min   GFR calc Af Amer  >60 >60 mL/min    Comment: (NOTE) The eGFR has been calculated using the CKD EPI equation. This calculation has not  been validated in all clinical situations. eGFR's persistently <60 mL/min signify possible Chronic Kidney Disease.    Anion gap 11 5 - 15  Protime-INR     Status: Abnormal   Collection Time: 01/27/17  3:35 PM  Result Value Ref Range   Prothrombin Time 40.0 (H) 11.4 - 15.2 seconds   INR 4.17 (HH)     Comment: CRITICAL RESULT CALLED TO, READ BACK BY AND VERIFIED WITH: ASHLEY SMITH 01/27/17 @ [place]   CBC with Differential/Platelet     Status: Abnormal   Collection Time: 02/10/17  3:11 PM  Result Value Ref Range   WBC 12.6 (H) 3.8 - 10.6 K/uL   RBC 3.84 (L) 4.40 - 5.90 MIL/uL   Hemoglobin 10.4 (L) 13.0 - 18.0 g/dL   HCT 31.5 (L) 40.0 - 52.0 %   MCV 82.0 80.0 - 100.0 fL   MCH 27.1 26.0 - 34.0 pg   MCHC 33.0 32.0 - 36.0 g/dL   RDW 16.9 (H) 11.5 - 14.5 %   Platelets 357 150 - 440 K/uL   Neutrophils Relative % 77 %   Neutro Abs 9.9 (H) 1.4 - 6.5 K/uL   Lymphocytes Relative 10 %   Lymphs Abs 1.2 1.0 - 3.6 K/uL   Monocytes Relative 7 %   Monocytes Absolute 0.8 0.2 - 1.0 K/uL   Eosinophils Relative 5 %   Eosinophils Absolute 0.6 0 - 0.7 K/uL   Basophils Relative 1 %   Basophils Absolute 0.1 0 - 0.1 K/uL  Comprehensive metabolic panel     Status: Abnormal   Collection Time: 02/10/17  3:11 PM  Result Value Ref Range   Sodium 136 135 - 145 mmol/L   Potassium 4.5 3.5 - 5.1 mmol/L   Chloride 99 (L) 101 - 111 mmol/L   CO2 26 22 - 32 mmol/L   Glucose, Bld 244 (H) 65 - 99 mg/dL   BUN 19 6 - 20 mg/dL   Creatinine, Ser 0.85 0.61 - 1.24 mg/dL   Calcium 8.2 (L) 8.9 - 10.3 mg/dL   Total Protein 6.1 (L) 6.5 - 8.1 g/dL   Albumin 2.6 (L) 3.5 - 5.0 g/dL   AST 34 15 - 41 U/L   ALT 27 17 - 63 U/L   Alkaline Phosphatase 213 (H) 38 - 126 U/L   Total Bilirubin 0.5 0.3 - 1.2 mg/dL   GFR calc non Af Amer >60 >60 mL/min   GFR calc Af Amer >60 >60 mL/min    Comment: (NOTE) The  eGFR has been calculated using the CKD EPI equation. This calculation has not been validated in all clinical situations. eGFR's persistently <60 mL/min signify possible Chronic Kidney Disease.    Anion gap 11 5 - 15  Protime-INR     Status: Abnormal   Collection Time: 02/10/17  3:11 PM  Result Value Ref Range   Prothrombin Time 17.4 (H) 11.4 - 15.2 seconds   INR 1.44   Blood culture (routine x 2)     Status: None   Collection Time: 02/10/17  4:39 PM  Result Value Ref Range   Specimen Description BLOOD RIGHT ANTECUBITAL    Special Requests      BOTTLES DRAWN AEROBIC AND ANAEROBIC Blood Culture results may not be optimal due to an excessive volume of blood received in culture bottles   Culture NO GROWTH 5 DAYS    Report Status 02/15/2017 FINAL   Blood culture (routine x 2)     Status: None   Collection  Time: 02/10/17  4:39 PM  Result Value Ref Range   Specimen Description BLOOD LEFT ANTECUBITAL    Special Requests      BOTTLES DRAWN AEROBIC AND ANAEROBIC Blood Culture results may not be optimal due to an excessive volume of blood received in culture bottles   Culture NO GROWTH 5 DAYS    Report Status 02/15/2017 FINAL   Wound or Superficial Culture     Status: None   Collection Time: 02/10/17  4:39 PM  Result Value Ref Range   Specimen Description WOUND    Special Requests LL    Gram Stain NO WBC SEEN FEW GRAM POSITIVE COCCI     Culture      FEW METHICILLIN RESISTANT STAPHYLOCOCCUS AUREUS WITHIN MIXED CULTURE Performed at [facility], [place] [address] [place], [place] [address]    Report Status 02/14/2017 FINAL    Organism ID, Bacteria METHICILLIN RESISTANT STAPHYLOCOCCUS AUREUS       Susceptibility   Methicillin resistant staphylococcus aureus - MIC*    CIPROFLOXACIN >=8 RESISTANT Resistant     ERYTHROMYCIN >=8 RESISTANT Resistant     GENTAMICIN <=0.5 SENSITIVE Sensitive     OXACILLIN >=4 RESISTANT Resistant     TETRACYCLINE <=1 SENSITIVE Sensitive     VANCOMYCIN  <=0.5 SENSITIVE Sensitive     TRIMETH/SULFA >=320 RESISTANT Resistant     CLINDAMYCIN <=0.25 SENSITIVE Sensitive     RIFAMPIN <=0.5 SENSITIVE Sensitive     Inducible Clindamycin NEGATIVE Sensitive     * FEW METHICILLIN RESISTANT STAPHYLOCOCCUS AUREUS  Glucose, capillary     Status: Abnormal   Collection Time: 02/10/17  9:30 PM  Result Value Ref Range   Glucose-Capillary 108 (H) 65 - 99 mg/dL   Comment 1 Notify RN   CBC     Status: Abnormal   Collection Time: 02/11/17  5:20 AM  Result Value Ref Range   WBC 9.8 3.8 - 10.6 K/uL   RBC 3.81 (L) 4.40 - 5.90 MIL/uL   Hemoglobin 10.5 (L) 13.0 - 18.0 g/dL   HCT 30.9 (L) 40.0 - 52.0 %   MCV 81.3 80.0 - 100.0 fL   MCH 27.6 26.0 - 34.0 pg   MCHC 34.0 32.0 - 36.0 g/dL   RDW 16.9 (H) 11.5 - 14.5 %   Platelets 285 150 - 440 K/uL  Glucose, capillary     Status: None   Collection Time: 02/11/17  7:44 AM  Result Value Ref Range   Glucose-Capillary 95 65 - 99 mg/dL   Comment 1 Notify RN   Glucose, capillary     Status: Abnormal   Collection Time: 02/11/17 11:41 AM  Result Value Ref Range   Glucose-Capillary 161 (H) 65 - 99 mg/dL   Comment 1 Notify RN   Glucose, capillary     Status: Abnormal   Collection Time: 02/11/17  4:39 PM  Result Value Ref Range   Glucose-Capillary 140 (H) 65 - 99 mg/dL   Comment 1 Notify RN   Glucose, capillary     Status: Abnormal   Collection Time: 02/11/17  7:50 PM  Result Value Ref Range   Glucose-Capillary 189 (H) 65 - 99 mg/dL  CBC     Status: Abnormal   Collection Time: 02/12/17  3:51 AM  Result Value Ref Range   WBC 12.0 (H) 3.8 - 10.6 K/uL   RBC 4.20 (L) 4.40 - 5.90 MIL/uL   Hemoglobin 11.8 (L) 13.0 - 18.0 g/dL   HCT 34.7 (L) 40.0 - 52.0 %  MCV 82.6 80.0 - 100.0 fL   MCH 28.0 26.0 - 34.0 pg   MCHC 33.9 32.0 - 36.0 g/dL   RDW 17.3 (H) 11.5 - 14.5 %   Platelets 296 150 - 440 K/uL  Basic metabolic panel     Status: Abnormal   Collection Time: 02/12/17  3:51 AM  Result Value Ref Range   Sodium 137  135 - 145 mmol/L   Potassium 3.8 3.5 - 5.1 mmol/L   Chloride 101 101 - 111 mmol/L   CO2 28 22 - 32 mmol/L   Glucose, Bld 140 (H) 65 - 99 mg/dL   BUN 14 6 - 20 mg/dL   Creatinine, Ser 0.69 0.61 - 1.24 mg/dL   Calcium 8.4 (L) 8.9 - 10.3 mg/dL   GFR calc non Af Amer >60 >60 mL/min   GFR calc Af Amer >60 >60 mL/min    Comment: (NOTE) The eGFR has been calculated using the CKD EPI equation. This calculation has not been validated in all clinical situations. eGFR's persistently <60 mL/min signify possible Chronic Kidney Disease.    Anion gap 8 5 - 15  Uric acid     Status: Abnormal   Collection Time: 02/12/17  3:51 AM  Result Value Ref Range   Uric Acid, Serum 2.2 (L) 4.4 - 7.6 mg/dL  Glucose, capillary     Status: Abnormal   Collection Time: 02/12/17  8:06 AM  Result Value Ref Range   Glucose-Capillary 134 (H) 65 - 99 mg/dL   Comment 1 Notify RN   Aerobic Culture (superficial specimen)     Status: None   Collection Time: 02/12/17 11:00 AM  Result Value Ref Range   Specimen Description WOUND    Special Requests BELOW KNEE AMPUTATION    Gram Stain      RARE WBC PRESENT, PREDOMINANTLY PMN ABUNDANT GRAM POSITIVE COCCI Performed at [facility], [place] [address], [place], [place] [address]    Culture      MODERATE METHICILLIN RESISTANT STAPHYLOCOCCUS AUREUS WITH MIXED BACTERIAL ORGANISMS    Report Status 02/16/2017 FINAL    Organism ID, Bacteria METHICILLIN RESISTANT STAPHYLOCOCCUS AUREUS       Susceptibility   Methicillin resistant staphylococcus aureus - MIC*    CIPROFLOXACIN >=8 RESISTANT Resistant     ERYTHROMYCIN >=8 RESISTANT Resistant     GENTAMICIN <=0.5 SENSITIVE Sensitive     OXACILLIN >=4 RESISTANT Resistant     TETRACYCLINE <=1 SENSITIVE Sensitive     VANCOMYCIN <=0.5 SENSITIVE Sensitive     TRIMETH/SULFA >=320 RESISTANT Resistant     CLINDAMYCIN <=0.25 SENSITIVE Sensitive     RIFAMPIN <=0.5 SENSITIVE Sensitive     Inducible Clindamycin NEGATIVE Sensitive       * MODERATE METHICILLIN RESISTANT STAPHYLOCOCCUS AUREUS  Glucose, capillary     Status: Abnormal   Collection Time: 02/12/17 11:47 AM  Result Value Ref Range   Glucose-Capillary 141 (H) 65 - 99 mg/dL   Comment 1 Notify RN     Radiology No results found.    Assessment/Plan Hyperlipidemia lipid control important in reducing the progression of atherosclerotic disease. Continue statin therapy   Type 2 diabetes mellitus treated with insulin (HCC) blood glucose control important in reducing the progression of atherosclerotic disease. Also, involved in wound healing. On appropriate medications.   Essential hypertension blood pressure control important in reducing the progression of atherosclerotic disease. On appropriate oral medications.  A-fib (HCC) On anticoagulation. Rapid A. fib lead to a prolonged hospitalization last time.  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  S/P BKA (below knee amputation) unilateral, left (HCC) With the exception of a very small scab on the medial aspect measuring only about 5 mm in width and a few millimeters in length, the wound is completely healed without signs of infection. At this point, I think we can begin the process of getting him a prosthesis.  I have put in a referral to biotech.  I will plan to see him back in 3-4 months.    Leotis Pain, MD  04/22/2017 2:21 PM    This note was created with Dragon medical transcription system.  Any errors from dictation are purely unintentional

## 2017-06-29 ENCOUNTER — Other Ambulatory Visit: Payer: Self-pay

## 2017-06-29 ENCOUNTER — Ambulatory Visit: Payer: Medicare Other | Attending: Vascular Surgery | Admitting: Physical Therapy

## 2017-06-29 ENCOUNTER — Encounter: Payer: Self-pay | Admitting: Physical Therapy

## 2017-06-29 DIAGNOSIS — S88112A Complete traumatic amputation at level between knee and ankle, left lower leg, initial encounter: Secondary | ICD-10-CM | POA: Diagnosis present

## 2017-06-29 DIAGNOSIS — X58XXXA Exposure to other specified factors, initial encounter: Secondary | ICD-10-CM | POA: Insufficient documentation

## 2017-06-29 DIAGNOSIS — M6281 Muscle weakness (generalized): Secondary | ICD-10-CM | POA: Insufficient documentation

## 2017-06-29 DIAGNOSIS — R269 Unspecified abnormalities of gait and mobility: Secondary | ICD-10-CM | POA: Diagnosis present

## 2017-06-30 ENCOUNTER — Ambulatory Visit: Payer: Medicare Other | Admitting: Physical Therapy

## 2017-06-30 ENCOUNTER — Encounter: Payer: Self-pay | Admitting: Physical Therapy

## 2017-06-30 DIAGNOSIS — S88112A Complete traumatic amputation at level between knee and ankle, left lower leg, initial encounter: Secondary | ICD-10-CM

## 2017-06-30 DIAGNOSIS — M6281 Muscle weakness (generalized): Secondary | ICD-10-CM

## 2017-06-30 DIAGNOSIS — R269 Unspecified abnormalities of gait and mobility: Secondary | ICD-10-CM

## 2017-06-30 NOTE — Therapy (Signed)
[place] [facility] [address] [place], [place], [address] Phone: [phone]   Fax:  [phone]  Physical Therapy Evaluation  Patient Details  Name: [name] MRN: [id number] Date of Birth: 05/09/1935 Referring Provider: Dr. Leotis Pain   Encounter Date: 06/29/2017  PT End of Session - 06/30/17 0734    Visit Number  1    Number of Visits  8    Date for PT Re-Evaluation  07/27/17    PT Start Time  1101    PT Stop Time  1202    PT Time Calculation (min)  61 min    Equipment Utilized During Treatment  Gait belt    Activity Tolerance  Patient tolerated treatment well    Behavior During Therapy  [facility] for tasks assessed/performed       Past Medical History:  Diagnosis Date  . Arthritis   . Atrial fibrillation ([place])   . Carcinoma of prostate ([place])   . CHF (congestive heart failure) ([place])   . Chronic kidney disease   . Diabetes mellitus without complication ([place])   . ED (erectile dysfunction)   . Frequent urination   . Hyperlipidemia   . Hypertension   . Moderate mitral insufficiency   . Peripheral vascular disease ([place])   . Pneumonia 04/2016  . Prostate cancer ([place])   . Sleep apnea    OSA--USE C-PAP  . Ulcer of left foot due to type 2 diabetes mellitus ([place])   . Urinary stress incontinence, male     Past Surgical History:  Procedure Laterality Date  . AMPUTATION Left 01/12/2017   Procedure: AMPUTATION BELOW KNEE;  Surgeon: Algernon Huxley, MD;  Location: [facility];  Service: General;  Laterality: Left;  . APLIGRAFT PLACEMENT Left 10/15/2016   Procedure: APLIGRAFT PLACEMENT;  Surgeon: Albertine Patricia, DPM;  Location: [facility];  Service: Podiatry;  Laterality: Left;  necrotic ulcer  . CHOLECYSTECTOMY    . EYE SURGERY Bilateral    Cataract Extraction with IOL  . IRRIGATION AND DEBRIDEMENT FOOT Left 10/15/2016   Procedure: IRRIGATION AND DEBRIDEMENT FOOT-EXCISIONAL DEBRIDEMENT OF SKIN 3RD, 4TH AND 5TH TOES WITH APPLICATION OF  APLIGRAFT;  Surgeon: Albertine Patricia, DPM;  Location: [facility];  Service: Podiatry;  Laterality: Left;  necrotic,gangrene  . IRRIGATION AND DEBRIDEMENT FOOT Left 12/09/2016   Procedure: IRRIGATION AND DEBRIDEMENT FOOT-MUSCLE/FASCIA, RESECTION OF FOURTH AND FIFTH METATARSAL NECROTIC BONE;  Surgeon: Albertine Patricia, DPM;  Location: [facility];  Service: Podiatry;  Laterality: Left;  . LOWER EXTREMITY ANGIOGRAPHY Left 08/16/2016   Procedure: Lower Extremity Angiography;  Surgeon: Algernon Huxley, MD;  Location: [place] [facility];  Service: Cardiovascular;  Laterality: Left;  . LOWER EXTREMITY ANGIOGRAPHY Left 09/01/2016   Procedure: Lower Extremity Angiography;  Surgeon: Algernon Huxley, MD;  Location: [place] [facility];  Service: Cardiovascular;  Laterality: Left;  . LOWER EXTREMITY ANGIOGRAPHY Left 10/14/2016   Procedure: Lower Extremity Angiography;  Surgeon: Algernon Huxley, MD;  Location: [place] [facility];  Service: Cardiovascular;  Laterality: Left;  . LOWER EXTREMITY ANGIOGRAPHY Left 12/20/2016   Procedure: Lower Extremity Angiography;  Surgeon: Algernon Huxley, MD;  Location: [place] [facility];  Service: Cardiovascular;  Laterality: Left;  . LOWER EXTREMITY INTERVENTION  09/01/2016   Procedure: Lower Extremity Intervention;  Surgeon: Algernon Huxley, MD;  Location: [place] [facility];  Service: Cardiovascular;;  . PROSTATE SURGERY     removal  . TONSILLECTOMY     as a child  There were no vitals filed for this visit.   Subjective Assessment - 06/30/17 0726    Subjective  Pt. drove to initial PT evaluation but required assist to get w/c.  Mod. I with transfer from car to w/c.  S/p L BKA on 01/12/18.      Patient is accompained by:  Family member    Limitations  Lifting;Standing;Walking;House hold activities    Patient Stated Goals  improve walking/ standing tolerance and safety.      Currently in Pain?  No/denies         [facility] PT Assessment - 06/30/17 0001      Assessment   Medical  Diagnosis  S/p L BKA, Difficulty walking    Referring Provider  Dr. Leotis Pain    Onset Date/Surgical Date  01/12/17    Next MD Visit  -- March 1st, 2019    Prior Therapy  HHPT      Balance Screen   Has the patient fallen in the past 6 months  No    Has the patient had a decrease in activity level because of a fear of falling?   Yes      Prior Function   Level of Independence  Independent with basic ADLs        Prosthetic training: instruct in L residual limb management and donning/doffing gel liner/ ply socks/ prosthesis.  Reviewed HEP.      PT Education - 06/30/17 0733    Education provided  Yes    Education Details  Donning/doffing prosthesis/ Proper transfers/ Gait training    Person(s) Educated  Patient;Spouse    Methods  Explanation;Demonstration    Comprehension  Verbalized understanding;Returned demonstration          PT Long Term Goals - 06/30/17 6333      PT LONG TERM GOAL #1   Title  Pt. able to transfer from w/c to least assistive device with mod. I and use of prosthesis safety.      Baseline  Mod. assist to stand with proper technique and use of prosthesis    Time  4    Period  Weeks    Status  New    Target Date  07/27/17      PT LONG TERM GOAL #2   Title  Pt. will increase FOTO to 49 to improve functional mobility.      Baseline  FOTO on 2/6: 34    Time  4    Period  Weeks    Status  New    Target Date  07/27/17      PT LONG TERM GOAL #3   Title  Pt. able to don/ doff L prosthesis with mod. I prior to PT tx. session to promote ability to walk to car/ ambulate into PT clinic.      Baseline  Pt. wearing prosthesis for 30 min. several times/day.      Time  4    Period  Weeks    Status  New    Target Date  07/27/17      PT LONG TERM GOAL #4   Title  Pt. able to ambulate 300 feet with use of least assistive device and prosthesis to improve safe functional mobility.      Baseline  pt. ambulates short distances in //-bars and with RW in PT clinic  with mod. A.      Time  4    Period  Weeks    Status  New  Target Date  07/27/17      PT LONG TERM GOAL #5   Title  Pt. able to amb. up/down ramp at home with mod. I and use of prosthesis safely.      Baseline  has not tried to walk on ramp at this time.     Time  4    Period  Weeks    Status  New    Target Date  07/27/17             Plan - 06/30/17 0735    Clinical Impression Statement  Pt. is a pleasant 83 y/o male s/p L BKA on 01/12/17.  Pt. reports no pain in L residual limb but present with distal limb redness, dry flaky skin on anterior aspect.  Good incision healing.  Pt. currently using w/c for community ambulation and has minimal experience with donning/ doffing prosthesis and gait with RW.  Mod. independence with transfers and min. assist for proper don/doff prosthesis.  No swelling noted: L residual joint line: 40 cm/ 2" superior: 36 cm./ 4" inferior: 35 cm.  Pt. currently wearing 3 ply + 1 ply sock with prostheis and requires mod. A to ambulate short distances in //-bars or RW safely.  Pt. will benefit from skilled PT services to increase LE strength/ mod. independence with gait to improve safety/ functional mobility.       Clinical Presentation  Stable    Clinical Decision Making  Low    Rehab Potential  Good    PT Frequency  2x / week    PT Duration  4 weeks    PT Treatment/Interventions  ADLs/Self Care Home Management;Therapeutic activities;Functional mobility training;Stair training;Gait training;Therapeutic exercise;Balance training;Neuromuscular re-education;Patient/family education;Manual techniques;Scar mobilization;Passive range of motion;Prosthetic Training    PT Next Visit Plan  Gait/prosthetic training.  Assess Thomas test/ mat exercise.      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 BKA stump complication ([place]) 26/83/4196  . 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 S/P BKA (below knee amputation) unilateral, left ([place]) 02/01/2017  . Chronic diastolic heart failure ([place]) 01/21/2017  . Pressure injury of skin 01/18/2017  . 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 Diabetic foot ulcer ([place]) 12/29/2016  . Ataxia 10/21/2016  . History of femoral angiogram 09/06/2016  . Left leg pain 09/06/2016  . Leukocytosis 09/06/2016  . Generalized weakness 09/06/2016  . A-fib ([place]) 08/30/2016  . Pneumonia 05/19/2016  . Hyperlipidemia 04/20/2016  . Back pain 04/19/2016  . Type 2 diabetes mellitus treated with insulin ([place]) 04/19/2016  . Essential hypertension 04/19/2016  . PAD (peripheral artery disease) ([place]) 04/19/2016  . Erectile dysfunction following radical prostatectomy 06/25/2015  . History of prostate cancer 12/23/2014  . Incontinence 12/23/2014   Pura Spice, PT, DPT # [phone] 06/30/2017, 8:32 AM   [facility] [address] [place], [place], [address] Phone: [phone]  Fax:  [phone]  Name: [name] MRN: [id number] Date of Birth: 1935/05/01

## 2017-06-30 NOTE — Therapy (Signed)
[place] [facility] [address] [place], [place], [address] Phone: [phone]   Fax:  [phone]  Physical Therapy Treatment  Patient Details  Name: [name] MRN: [id number] Date of Birth: Apr 29, 1935 Referring Provider: Dr. Leotis Pain   Encounter Date: 06/30/2017  PT End of Session - 06/30/17 1230    Visit Number  2    Number of Visits  8    Date for PT Re-Evaluation  07/27/17    PT Start Time  1028    PT Stop Time  1130    PT Time Calculation (min)  62 min    Equipment Utilized During Treatment  Gait belt    Activity Tolerance  Patient tolerated treatment well    Behavior During Therapy  [facility] for tasks assessed/performed       Past Medical History:  Diagnosis Date  . Arthritis   . Atrial fibrillation ([place])   . Carcinoma of prostate ([place])   . CHF (congestive heart failure) ([place])   . Chronic kidney disease   . Diabetes mellitus without complication ([place])   . ED (erectile dysfunction)   . Frequent urination   . Hyperlipidemia   . Hypertension   . Moderate mitral insufficiency   . Peripheral vascular disease ([place])   . Pneumonia 04/2016  . Prostate cancer ([place])   . Sleep apnea    OSA--USE C-PAP  . Ulcer of left foot due to type 2 diabetes mellitus ([place])   . Urinary stress incontinence, male     Past Surgical History:  Procedure Laterality Date  . AMPUTATION Left 01/12/2017   Procedure: AMPUTATION BELOW KNEE;  Surgeon: Algernon Huxley, MD;  Location: [facility];  Service: General;  Laterality: Left;  . APLIGRAFT PLACEMENT Left 10/15/2016   Procedure: APLIGRAFT PLACEMENT;  Surgeon: Albertine Patricia, DPM;  Location: [facility];  Service: Podiatry;  Laterality: Left;  necrotic ulcer  . CHOLECYSTECTOMY    . EYE SURGERY Bilateral    Cataract Extraction with IOL  . IRRIGATION AND DEBRIDEMENT FOOT Left 10/15/2016   Procedure: IRRIGATION AND DEBRIDEMENT FOOT-EXCISIONAL DEBRIDEMENT OF SKIN 3RD, 4TH AND 5TH TOES WITH APPLICATION OF  APLIGRAFT;  Surgeon: Albertine Patricia, DPM;  Location: [facility];  Service: Podiatry;  Laterality: Left;  necrotic,gangrene  . IRRIGATION AND DEBRIDEMENT FOOT Left 12/09/2016   Procedure: IRRIGATION AND DEBRIDEMENT FOOT-MUSCLE/FASCIA, RESECTION OF FOURTH AND FIFTH METATARSAL NECROTIC BONE;  Surgeon: Albertine Patricia, DPM;  Location: [facility];  Service: Podiatry;  Laterality: Left;  . LOWER EXTREMITY ANGIOGRAPHY Left 08/16/2016   Procedure: Lower Extremity Angiography;  Surgeon: Algernon Huxley, MD;  Location: [place] [facility];  Service: Cardiovascular;  Laterality: Left;  . LOWER EXTREMITY ANGIOGRAPHY Left 09/01/2016   Procedure: Lower Extremity Angiography;  Surgeon: Algernon Huxley, MD;  Location: [place] [facility];  Service: Cardiovascular;  Laterality: Left;  . LOWER EXTREMITY ANGIOGRAPHY Left 10/14/2016   Procedure: Lower Extremity Angiography;  Surgeon: Algernon Huxley, MD;  Location: [place] [facility];  Service: Cardiovascular;  Laterality: Left;  . LOWER EXTREMITY ANGIOGRAPHY Left 12/20/2016   Procedure: Lower Extremity Angiography;  Surgeon: Algernon Huxley, MD;  Location: [place] [facility];  Service: Cardiovascular;  Laterality: Left;  . LOWER EXTREMITY INTERVENTION  09/01/2016   Procedure: Lower Extremity Intervention;  Surgeon: Algernon Huxley, MD;  Location: [place] [facility];  Service: Cardiovascular;;  . PROSTATE SURGERY     removal  . TONSILLECTOMY     as a child  There were no vitals filed for this visit.  Subjective Assessment - 06/30/17 1222    Subjective  Pt. reports feeling no pain 0/10 NPS today. He is ambulating around house in wheel chair, and wore prosthetic leg "some" last night for roughly 10 minutes. Pt. is concerned about "getting rid" of the wheel chair, and walking again.    Patient is accompained by:  Family member    Limitations  Lifting;Standing;Walking;House hold activities    Patient Stated Goals  improve walking/ standing tolerance and safety.      Currently in  Pain?  No/denies      Treatment:   There.ex: Supine Hamstring stretch for 30 sec hold x2 Thomas test position with R leg down for rectus stretch 30 sec hold x2 Supine SAQ on red bolster for 5 sec hold x10 B Supine glute bridge on red bolster under knees for 5 sec hold x10  Gait training:  Gait with RW for increased stride length, heel strike, and hip flexor activation Amb in //-bar working on hip flexion/ consistent step pattern/ upright posture.   Neuro: //-bars 2 UE assist 1" step over (4) step to gait alternating with mirror for proprioception L/R x10 //- bars 2 UE assist forwards/backwards/lateral  with focus on not looking down    [facility] PT Assessment - 06/30/17 0001      Assessment   Medical Diagnosis  S/p L BKA, Difficulty walking    Referring Provider  Dr. Leotis Pain    Onset Date/Surgical Date  01/12/17    Next MD Visit  -- March 1st, 2019    Prior Therapy  HHPT      Balance Screen   Has the patient fallen in the past 6 months  No    Has the patient had a decrease in activity level because of a fear of falling?   Yes      Prior Function   Level of Independence  Independent with basic ADLs         PT Education - 06/30/17 0733    Education provided  Yes    Education Details  Donning/doffing prosthesis/ Proper transfers/ Gait training    Person(s) Educated  Patient;Spouse    Methods  Explanation;Demonstration    Comprehension  Verbalized understanding;Returned demonstration          PT Long Term Goals - 06/30/17 6962      PT LONG TERM GOAL #1   Title  Pt. able to transfer from w/c to least assistive device with mod. I and use of prosthesis safety.      Baseline  Mod. assist to stand with proper technique and use of prosthesis    Time  4    Period  Weeks    Status  New    Target Date  07/27/17      PT LONG TERM GOAL #2   Title  Pt. will increase FOTO to 49 to improve functional mobility.      Baseline  FOTO on 2/6: 34    Time  4    Period  Weeks     Status  New    Target Date  07/27/17      PT LONG TERM GOAL #3   Title  Pt. able to don/ doff L prosthesis with mod. I prior to PT tx. session to promote ability to walk to car/ ambulate into PT clinic.      Baseline  Pt. wearing prosthesis for 30 min. several times/day.  Time  4    Period  Weeks    Status  New    Target Date  07/27/17      PT LONG TERM GOAL #4   Title  Pt. able to ambulate 300 feet with use of least assistive device and prosthesis to improve safe functional mobility.      Baseline  pt. ambulates short distances in //-bars and with RW in PT clinic with mod. A.      Time  4    Period  Weeks    Status  New    Target Date  07/27/17      PT LONG TERM GOAL #5   Title  Pt. able to amb. up/down ramp at home with mod. I and use of prosthesis safely.      Baseline  has not tried to walk on ramp at this time.     Time  4    Period  Weeks    Status  New    Target Date  07/27/17            Plan - 06/30/17 1233    Clinical Impression Statement  Pt. presented with no residual limb pain, but he did feel pressure while wearing the prosthesis. Residual limb is dry, red, and flaking. Swelling remains consistent. Thomas test was positive for rectus on R and negative for iliopsoas B. Hamstring length B is limited distally and R side limited proximal as well. Strength testing of (R/L) quad (4/4), hamstring (-4/-4), hip flexor (3+/3+), abd (4+/4+), add (4-/4-).    Clinical Presentation  Stable    Clinical Presentation due to:  Pt. reports feeling no pain 0/10 NPS today. He is ambulating around house in wheel chair, and wore prosthetic leg "some" last night for roughly 10 minutes. Pt. is concerned about "getting rid" of the wheel chair, and walking again.    Clinical Decision Making  Low    Rehab Potential  Good    PT Frequency  2x / week    PT Duration  4 weeks    PT Treatment/Interventions  ADLs/Self Care Home Management;Therapeutic activities;Functional mobility  training;Stair training;Gait training;Therapeutic exercise;Balance training;Neuromuscular re-education;Patient/family education;Manual techniques;Scar mobilization;Passive range of motion;Prosthetic Training    PT Next Visit Plan  Gait/prosthetic training/ hip flexor strengthening    PT Home Exercise Plan  reviewed HEP       Patient will benefit from skilled therapeutic intervention in order to improve the following deficits and impairments:  Abnormal gait, Decreased balance, Decreased endurance, Decreased mobility, Decreased skin integrity, Difficulty walking, Improper body mechanics, Decreased range of motion, Decreased activity tolerance, Decreased strength, Decreased safety awareness, Impaired flexibility  Visit Diagnosis: Muscle weakness (generalized)  Gait difficulty  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 BKA stump complication ([place]) 14/78/2956  . 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 S/P BKA (below knee amputation) unilateral, left ([place]) 02/01/2017  . Chronic diastolic heart failure ([place]) 01/21/2017  . Pressure injury of skin 01/18/2017  . 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 Diabetic foot ulcer ([place]) 12/29/2016  . Ataxia 10/21/2016  . History of femoral angiogram 09/06/2016  . Left leg pain 09/06/2016  . Leukocytosis 09/06/2016  . Generalized weakness 09/06/2016  . A-fib ([place]) 08/30/2016  . Pneumonia 05/19/2016  . Hyperlipidemia 04/20/2016  . Back pain 04/19/2016  .  Type 2 diabetes mellitus treated with insulin ([place]) 04/19/2016  . Essential hypertension 04/19/2016  . PAD (peripheral artery disease) ([place]) 04/19/2016  . Erectile dysfunction following radical prostatectomy 06/25/2015  . History of prostate cancer 12/23/2014  . Incontinence 12/23/2014   Pura Spice, PT, DPT #  [phone] 06/30/2017, 5:42 PM  [facility] [address] [place], [place], [address] Phone: [phone]   Fax:  [phone]  Name: DARELD MCAULIFFE MRN: [id number] Date of Birth: 1935/01/12

## 2017-07-05 ENCOUNTER — Encounter: Payer: Self-pay | Admitting: Physical Therapy

## 2017-07-05 ENCOUNTER — Ambulatory Visit: Payer: Medicare Other | Admitting: Physical Therapy

## 2017-07-05 DIAGNOSIS — M6281 Muscle weakness (generalized): Secondary | ICD-10-CM

## 2017-07-05 DIAGNOSIS — S88112A Complete traumatic amputation at level between knee and ankle, left lower leg, initial encounter: Secondary | ICD-10-CM

## 2017-07-05 DIAGNOSIS — R269 Unspecified abnormalities of gait and mobility: Secondary | ICD-10-CM

## 2017-07-05 NOTE — Therapy (Signed)
[facility] [address]. [place], [place], [address] Phone: [phone]   Fax:  [phone]  Physical Therapy Treatment  Patient Details  Name: [name] MRN: [id number] Date of Birth: 1934-09-09 Referring Provider: Dr. Leotis Pain   Encounter Date: 07/05/2017  PT End of Session - 07/05/17 1459    Visit Number  3    Number of Visits  8    Date for PT Re-Evaluation  07/27/17    PT Start Time  1032    PT Stop Time  1132    PT Time Calculation (min)  60 min    Equipment Utilized During Treatment  Gait belt    Activity Tolerance  Patient tolerated treatment well    Behavior During Therapy  [facility] for tasks assessed/performed       Past Medical History:  Diagnosis Date  . Arthritis   . Atrial fibrillation ([place])   . Carcinoma of prostate ([place])   . CHF (congestive heart failure) ([place])   . Chronic kidney disease   . Diabetes mellitus without complication ([place])   . ED (erectile dysfunction)   . Frequent urination   . Hyperlipidemia   . Hypertension   . Moderate mitral insufficiency   . Peripheral vascular disease ([place])   . Pneumonia 04/2016  . Prostate cancer ([place])   . Sleep apnea    OSA--USE C-PAP  . Ulcer of left foot due to type 2 diabetes mellitus ([place])   . Urinary stress incontinence, male     Past Surgical History:  Procedure Laterality Date  . AMPUTATION Left 01/12/2017   Procedure: AMPUTATION BELOW KNEE;  Surgeon: Algernon Huxley, MD;  Location: [facility];  Service: General;  Laterality: Left;  . APLIGRAFT PLACEMENT Left 10/15/2016   Procedure: APLIGRAFT PLACEMENT;  Surgeon: Albertine Patricia, DPM;  Location: [facility];  Service: Podiatry;  Laterality: Left;  necrotic ulcer  . CHOLECYSTECTOMY    . EYE SURGERY Bilateral    Cataract Extraction with IOL  . IRRIGATION AND DEBRIDEMENT FOOT Left 10/15/2016   Procedure: IRRIGATION AND DEBRIDEMENT FOOT-EXCISIONAL DEBRIDEMENT OF SKIN 3RD, 4TH AND 5TH TOES WITH APPLICATION OF  APLIGRAFT;  Surgeon: Albertine Patricia, DPM;  Location: [facility];  Service: Podiatry;  Laterality: Left;  necrotic,gangrene  . IRRIGATION AND DEBRIDEMENT FOOT Left 12/09/2016   Procedure: IRRIGATION AND DEBRIDEMENT FOOT-MUSCLE/FASCIA, RESECTION OF FOURTH AND FIFTH METATARSAL NECROTIC BONE;  Surgeon: Albertine Patricia, DPM;  Location: [facility];  Service: Podiatry;  Laterality: Left;  . LOWER EXTREMITY ANGIOGRAPHY Left 08/16/2016   Procedure: Lower Extremity Angiography;  Surgeon: Algernon Huxley, MD;  Location: [place] [facility];  Service: Cardiovascular;  Laterality: Left;  . LOWER EXTREMITY ANGIOGRAPHY Left 09/01/2016   Procedure: Lower Extremity Angiography;  Surgeon: Algernon Huxley, MD;  Location: [place] [facility];  Service: Cardiovascular;  Laterality: Left;  . LOWER EXTREMITY ANGIOGRAPHY Left 10/14/2016   Procedure: Lower Extremity Angiography;  Surgeon: Algernon Huxley, MD;  Location: [place] [facility];  Service: Cardiovascular;  Laterality: Left;  . LOWER EXTREMITY ANGIOGRAPHY Left 12/20/2016   Procedure: Lower Extremity Angiography;  Surgeon: Algernon Huxley, MD;  Location: [place] [facility];  Service: Cardiovascular;  Laterality: Left;  . LOWER EXTREMITY INTERVENTION  09/01/2016   Procedure: Lower Extremity Intervention;  Surgeon: Algernon Huxley, MD;  Location: [place] [facility];  Service: Cardiovascular;;  . PROSTATE SURGERY     removal  . TONSILLECTOMY     as a child  There were no vitals filed for this visit.  Subjective Assessment - 07/05/17 1456    Subjective  Pt. reports feeling no pain 0/10 NPS today. Pt. states some compliance with HEP, but "it's hard". Pt. is concerned with the weight of prosthetic. Pt. is mainly using wheel chair at home, but he has increased wearing/using prosthetic.  Pt. will occasionally use RW to ambulate short distances at home (no falls or LOB reported).      Patient is accompained by:  Family member    Limitations  Lifting;Standing;Walking;House hold  activities    Patient Stated Goals  improve walking/ standing tolerance and safety.      Currently in Pain?  No/denies        Gait: Walk with RW focus on reciprocal gait and verbal cueing to reduce NBOS and ER in clinic and too car. Stair training 1-2 UE assist with PT CGA-Min. A WNL base of support. Pt.  needed cueing for step to gait.  There.ex: Seated B Hip flexor marching with isometric 5 sec hold and PT manual resist Min. x10 Supine Thomas test position for R rectus femoris stretch for 30 sec hold PT with over pressure Supine L/R hip abd and add with towel under foot to reduce friction x10 Hooklying with red bolster under knees- B SAQ for 5 sec hold x10, glute bridge for 3 sec hold x10  Neuro: //-bars with 2 UE assist- high knee march x2/ backwards walk x2/ lateral side step L and R x2     Initial instruct to don prosthesis and reassure pt. With proper positioning of pin lock/ ply socks.  Pt. Instructed to wear prosthesis to PT next tx. Session.       PT Long Term Goals - 06/30/17 8938      PT LONG TERM GOAL #1   Title  Pt. able to transfer from w/c to least assistive device with mod. I and use of prosthesis safety.      Baseline  Mod. assist to stand with proper technique and use of prosthesis    Time  4    Period  Weeks    Status  New    Target Date  07/27/17      PT LONG TERM GOAL #2   Title  Pt. will increase FOTO to 49 to improve functional mobility.      Baseline  FOTO on 2/6: 34    Time  4    Period  Weeks    Status  New    Target Date  07/27/17      PT LONG TERM GOAL #3   Title  Pt. able to don/ doff L prosthesis with mod. I prior to PT tx. session to promote ability to walk to car/ ambulate into PT clinic.      Baseline  Pt. wearing prosthesis for 30 min. several times/day.      Time  4    Period  Weeks    Status  New    Target Date  07/27/17      PT LONG TERM GOAL #4   Title  Pt. able to ambulate 300 feet with use of least assistive device and  prosthesis to improve safe functional mobility.      Baseline  pt. ambulates short distances in //-bars and with RW in PT clinic with mod. A.      Time  4    Period  Weeks    Status  New    Target Date  07/27/17      PT LONG TERM GOAL #5   Title  Pt. able to amb. up/down ramp at home with mod. I and use of prosthesis safely.      Baseline  has not tried to walk on ramp at this time.     Time  4    Period  Weeks    Status  New    Target Date  07/27/17            Plan - 07/05/17 1500    Clinical Impression Statement  Pt. reports no residual limb pain, but he feels increased pressure with single leg stance on residual limb. L residual limb dry and flaky with some scabbing. Pt. has improved gait today in RW with less ER and foot drag of L LE. However, Pt. presents with NBOS with walking and foot drag with fatigue. PT noted increased weakness in L hip flexor compared to R. PT noted increased muscle tension with rectus femoris stretch R LE.    Rehab Potential  Good    PT Frequency  2x / week    PT Duration  4 weeks    PT Treatment/Interventions  ADLs/Self Care Home Management;Therapeutic activities;Functional mobility training;Stair training;Gait training;Therapeutic exercise;Balance training;Neuromuscular re-education;Patient/family education;Manual techniques;Scar mobilization;Passive range of motion;Prosthetic Training    PT Next Visit Plan  Gait/ hip flexor strengthening/ Residual knee extension    PT Home Exercise Plan  reviewed HEP    Consulted and Agree with Plan of Care  Patient       Patient will benefit from skilled therapeutic intervention in order to improve the following deficits and impairments:  Abnormal gait, Decreased balance, Decreased endurance, Decreased mobility, Decreased skin integrity, Difficulty walking, Improper body mechanics, Decreased range of motion, Decreased activity tolerance, Decreased strength, Decreased safety awareness, Impaired flexibility  Visit  Diagnosis: Muscle weakness (generalized)  Gait difficulty  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 BKA stump complication ([place]) 26/94/8546  . 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 S/P BKA (below knee amputation) unilateral, left ([place]) 02/01/2017  . Chronic diastolic heart failure ([place]) 01/21/2017  . Pressure injury of skin 01/18/2017  . 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 Diabetic foot ulcer ([place]) 12/29/2016  . Ataxia 10/21/2016  . History of femoral angiogram 09/06/2016  . Left leg pain 09/06/2016  . Leukocytosis 09/06/2016  . Generalized weakness 09/06/2016  . A-fib ([place]) 08/30/2016  . Pneumonia 05/19/2016  . Hyperlipidemia 04/20/2016  . Back pain 04/19/2016  . Type 2 diabetes mellitus treated with insulin ([place]) 04/19/2016  . Essential hypertension 04/19/2016  . PAD (peripheral artery disease) ([place]) 04/19/2016  . Erectile dysfunction following radical prostatectomy 06/25/2015  . History of prostate cancer 12/23/2014  . Incontinence 12/23/2014   Pura Spice, PT, DPT # [phone] 07/05/2017, 5:16 PM  [facility] [address] [place], [place], [address] Phone: [phone]   Fax:  [phone]  Name: [name] MRN: [id number] Date of Birth: 1935-02-02

## 2017-07-07 ENCOUNTER — Encounter: Payer: Self-pay | Admitting: Physical Therapy

## 2017-07-07 ENCOUNTER — Ambulatory Visit: Payer: Medicare Other | Admitting: Physical Therapy

## 2017-07-07 DIAGNOSIS — R269 Unspecified abnormalities of gait and mobility: Secondary | ICD-10-CM

## 2017-07-07 DIAGNOSIS — S88112A Complete traumatic amputation at level between knee and ankle, left lower leg, initial encounter: Secondary | ICD-10-CM

## 2017-07-07 DIAGNOSIS — M6281 Muscle weakness (generalized): Secondary | ICD-10-CM | POA: Diagnosis not present

## 2017-07-08 NOTE — Therapy (Signed)
[facility] [address] [place], [place], [address] Phone: [phone]   Fax:  [phone]  Physical Therapy Treatment  Patient Details  Name: [name] MRN: [id number] Date of Birth: January 25, 1935 Referring Provider: Dr. Leotis Pain   Encounter Date: 07/07/2017  PT End of Session - 07/07/17 1138    Visit Number  4    Number of Visits  8    Date for PT Re-Evaluation  07/27/17    PT Start Time  1019    PT Stop Time  1125    PT Time Calculation (min)  66 min    Equipment Utilized During Treatment  Gait belt    Activity Tolerance  Patient tolerated treatment well    Behavior During Therapy  [facility] for tasks assessed/performed       Past Medical History:  Diagnosis Date  . Arthritis   . Atrial fibrillation ([place])   . Carcinoma of prostate ([place])   . CHF (congestive heart failure) ([place])   . Chronic kidney disease   . Diabetes mellitus without complication ([place])   . ED (erectile dysfunction)   . Frequent urination   . Hyperlipidemia   . Hypertension   . Moderate mitral insufficiency   . Peripheral vascular disease ([place])   . Pneumonia 04/2016  . Prostate cancer ([place])   . Sleep apnea    OSA--USE C-PAP  . Ulcer of left foot due to type 2 diabetes mellitus ([place])   . Urinary stress incontinence, male     Past Surgical History:  Procedure Laterality Date  . AMPUTATION Left 01/12/2017   Procedure: AMPUTATION BELOW KNEE;  Surgeon: Algernon Huxley, MD;  Location: [facility];  Service: General;  Laterality: Left;  . APLIGRAFT PLACEMENT Left 10/15/2016   Procedure: APLIGRAFT PLACEMENT;  Surgeon: Albertine Patricia, DPM;  Location: [facility];  Service: Podiatry;  Laterality: Left;  necrotic ulcer  . CHOLECYSTECTOMY    . EYE SURGERY Bilateral    Cataract Extraction with IOL  . IRRIGATION AND DEBRIDEMENT FOOT Left 10/15/2016   Procedure: IRRIGATION AND DEBRIDEMENT FOOT-EXCISIONAL DEBRIDEMENT OF SKIN 3RD, 4TH AND 5TH TOES WITH APPLICATION OF  APLIGRAFT;  Surgeon: Albertine Patricia, DPM;  Location: [facility];  Service: Podiatry;  Laterality: Left;  necrotic,gangrene  . IRRIGATION AND DEBRIDEMENT FOOT Left 12/09/2016   Procedure: IRRIGATION AND DEBRIDEMENT FOOT-MUSCLE/FASCIA, RESECTION OF FOURTH AND FIFTH METATARSAL NECROTIC BONE;  Surgeon: Albertine Patricia, DPM;  Location: [facility];  Service: Podiatry;  Laterality: Left;  . LOWER EXTREMITY ANGIOGRAPHY Left 08/16/2016   Procedure: Lower Extremity Angiography;  Surgeon: Algernon Huxley, MD;  Location: [place] [facility];  Service: Cardiovascular;  Laterality: Left;  . LOWER EXTREMITY ANGIOGRAPHY Left 09/01/2016   Procedure: Lower Extremity Angiography;  Surgeon: Algernon Huxley, MD;  Location: [place] [facility];  Service: Cardiovascular;  Laterality: Left;  . LOWER EXTREMITY ANGIOGRAPHY Left 10/14/2016   Procedure: Lower Extremity Angiography;  Surgeon: Algernon Huxley, MD;  Location: [place] [facility];  Service: Cardiovascular;  Laterality: Left;  . LOWER EXTREMITY ANGIOGRAPHY Left 12/20/2016   Procedure: Lower Extremity Angiography;  Surgeon: Algernon Huxley, MD;  Location: [place] [facility];  Service: Cardiovascular;  Laterality: Left;  . LOWER EXTREMITY INTERVENTION  09/01/2016   Procedure: Lower Extremity Intervention;  Surgeon: Algernon Huxley, MD;  Location: [place] [facility];  Service: Cardiovascular;;  . PROSTATE SURGERY     removal  . TONSILLECTOMY     as a child  There were no vitals filed for this visit.  Subjective Assessment - 07/07/17 1131    Subjective  Pt. reports no residual limb pain at this time but states he was hurting at anterior/distal residual limb yesterday while wearing prosthesis.  Pt. had prosthesis donned prior to tx. session today.      Patient is accompained by:  Family member    Limitations  Lifting;Standing;Walking;House hold activities    Patient Stated Goals  improve walking/ standing tolerance and safety.      Currently in Pain?  No/denies            Treatment:  Gait: Walk with RW focus on reciprocal gait and verbal cueing to reduce NBOS and ER in clinic and to car. Stair training 1-2 UE assist with PT CGA-Min. A WNL base of support. Pt.  needed cueing for gait pattern. Ambulate in //-bars with R UE only and recip. 2-point gait pattern.  Cuing to maintain BOSU (used blue line for visual cuing).    There.ex: Supine L/R hip abd and add with towel under foot to reduce friction x10/ quad sets/ SLR/ SAQ 20x each. Sidelying hip abd./ extension with PT assist 10x each.   Discussed HEP.     PT Long Term Goals - 06/30/17 2993      PT LONG TERM GOAL #1   Title  Pt. able to transfer from w/c to least assistive device with mod. I and use of prosthesis safety.      Baseline  Mod. assist to stand with proper technique and use of prosthesis    Time  4    Period  Weeks    Status  New    Target Date  07/27/17      PT LONG TERM GOAL #2   Title  Pt. will increase FOTO to 49 to improve functional mobility.      Baseline  FOTO on 2/6: 34    Time  4    Period  Weeks    Status  New    Target Date  07/27/17      PT LONG TERM GOAL #3   Title  Pt. able to don/ doff L prosthesis with mod. I prior to PT tx. session to promote ability to walk to car/ ambulate into PT clinic.      Baseline  Pt. wearing prosthesis for 30 min. several times/day.      Time  4    Period  Weeks    Status  New    Target Date  07/27/17      PT LONG TERM GOAL #4   Title  Pt. able to ambulate 300 feet with use of least assistive device and prosthesis to improve safe functional mobility.      Baseline  pt. ambulates short distances in //-bars and with RW in PT clinic with mod. A.      Time  4    Period  Weeks    Status  New    Target Date  07/27/17      PT LONG TERM GOAL #5   Title  Pt. able to amb. up/down ramp at home with mod. I and use of prosthesis safely.      Baseline  has not tried to walk on ramp at this time.     Time  4    Period  Weeks     Status  New    Target Date  07/27/17  Plan - 07/07/17 1138    Clinical Impression Statement  Pt. continues to progress with mod. independence with gait while using RW in clinic/ outside.  Pts. distal residual limb continues to present with dry, flakey skin and redness (no warmth).  Pt. requires instruction to maintain proper BOS with use of RW and progressing to gait with use of R UE only in //-bars with recip. pattern.      Clinical Presentation  Stable    Clinical Decision Making  Low    Rehab Potential  Good    PT Frequency  2x / week    PT Duration  4 weeks    PT Treatment/Interventions  ADLs/Self Care Home Management;Therapeutic activities;Functional mobility training;Stair training;Gait training;Therapeutic exercise;Balance training;Neuromuscular re-education;Patient/family education;Manual techniques;Scar mobilization;Passive range of motion;Prosthetic Training    PT Next Visit Plan  Gait/ hip flexor strengthening/ Residual knee extension.  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 BKA stump complication ([place]) 54/49/2010  . 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 S/P BKA (below knee amputation) unilateral, left ([place]) 02/01/2017  . Chronic  diastolic heart failure ([place]) 01/21/2017  . Pressure injury of skin 01/18/2017  . 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 Diabetic foot ulcer ([place]) 12/29/2016  . Ataxia 10/21/2016  . History of femoral angiogram 09/06/2016  . Left leg pain 09/06/2016  . Leukocytosis 09/06/2016  . Generalized weakness 09/06/2016  . A-fib ([name]) 08/30/2016  . Pneumonia 05/19/2016  . Hyperlipidemia 04/20/2016  . Back pain 04/19/2016  . Type 2 diabetes mellitus treated with insulin ([place]) 04/19/2016  . Essential hypertension 04/19/2016  . PAD (peripheral artery disease) ([place]) 04/19/2016  . Erectile dysfunction following radical prostatectomy 06/25/2015  . History of prostate cancer 12/23/2014  . Incontinence 12/23/2014   Pura Spice, PT, DPT # [id number] 07/08/2017, 6:00 PM  [place] [facility] [address] [place], [place], [address] Phone: [phone]   Fax:  [phone]  Name: AHAD COLARUSSO MRN: [id number] Date of Birth: 01-01-1935

## 2017-07-12 ENCOUNTER — Ambulatory Visit: Payer: Medicare Other | Admitting: Physical Therapy

## 2017-07-12 ENCOUNTER — Encounter: Payer: Self-pay | Admitting: Physical Therapy

## 2017-07-12 DIAGNOSIS — S88112A Complete traumatic amputation at level between knee and ankle, left lower leg, initial encounter: Secondary | ICD-10-CM

## 2017-07-12 DIAGNOSIS — R269 Unspecified abnormalities of gait and mobility: Secondary | ICD-10-CM

## 2017-07-12 DIAGNOSIS — M6281 Muscle weakness (generalized): Secondary | ICD-10-CM

## 2017-07-12 NOTE — Therapy (Signed)
[facility] [address] [place], [place], [address] Phone: [phone]   Fax:  [phone]  Physical Therapy Treatment  Patient Details  Name: [name] MRN: [id number] Date of Birth: Jun 25, 1934 Referring Provider: Dr. Leotis Pain   Encounter Date: 07/12/2017  PT End of Session - 07/12/17 1728    Visit Number  5    Number of Visits  8    Date for PT Re-Evaluation  07/27/17    PT Start Time  1025    PT Stop Time  1130    PT Time Calculation (min)  65 min    Equipment Utilized During Treatment  Gait belt    Activity Tolerance  Patient tolerated treatment well    Behavior During Therapy  [facility] for tasks assessed/performed       Past Medical History:  Diagnosis Date  . Arthritis   . Atrial fibrillation ([place])   . Carcinoma of prostate ([place])   . CHF (congestive heart failure) ([place])   . Chronic kidney disease   . Diabetes mellitus without complication ([place])   . ED (erectile dysfunction)   . Frequent urination   . Hyperlipidemia   . Hypertension   . Moderate mitral insufficiency   . Peripheral vascular disease ([place])   . Pneumonia 04/2016  . Prostate cancer ([place])   . Sleep apnea    OSA--USE C-PAP  . Ulcer of left foot due to type 2 diabetes mellitus ([place])   . Urinary stress incontinence, male     Past Surgical History:  Procedure Laterality Date  . AMPUTATION Left 01/12/2017   Procedure: AMPUTATION BELOW KNEE;  Surgeon: Algernon Huxley, MD;  Location: [facility] OR[facility];  Service: General;  Laterality: Left;  . APLIGRAFT PLACEMENT Left 10/15/2016   Procedure: APLIGRAFT PLACEMENT;  Surgeon: Albertine Patricia, DPM;  Location: [facility];  Service: Podiatry;  Laterality: Left;  necrotic ulcer  . CHOLECYSTECTOMY    . EYE SURGERY Bilateral    Cataract Extraction with IOL  . IRRIGATION AND DEBRIDEMENT FOOT Left 10/15/2016   Procedure: IRRIGATION AND DEBRIDEMENT FOOT-EXCISIONAL DEBRIDEMENT OF SKIN 3RD, 4TH AND 5TH TOES WITH APPLICATION OF  APLIGRAFT;  Surgeon: Albertine Patricia, DPM;  Location: [facility];  Service: Podiatry;  Laterality: Left;  necrotic,gangrene  . IRRIGATION AND DEBRIDEMENT FOOT Left 12/09/2016   Procedure: IRRIGATION AND DEBRIDEMENT FOOT-MUSCLE/FASCIA, RESECTION OF FOURTH AND FIFTH METATARSAL NECROTIC BONE;  Surgeon: Albertine Patricia, DPM;  Location: [facility];  Service: Podiatry;  Laterality: Left;  . LOWER EXTREMITY ANGIOGRAPHY Left 08/16/2016   Procedure: Lower Extremity Angiography;  Surgeon: Algernon Huxley, MD;  Location: [place] [facility];  Service: Cardiovascular;  Laterality: Left;  . LOWER EXTREMITY ANGIOGRAPHY Left 09/01/2016   Procedure: Lower Extremity Angiography;  Surgeon: Algernon Huxley, MD;  Location: [place] [facility];  Service: Cardiovascular;  Laterality: Left;  . LOWER EXTREMITY ANGIOGRAPHY Left 10/14/2016   Procedure: Lower Extremity Angiography;  Surgeon: Algernon Huxley, MD;  Location: [place] [facility];  Service: Cardiovascular;  Laterality: Left;  . LOWER EXTREMITY ANGIOGRAPHY Left 12/20/2016   Procedure: Lower Extremity Angiography;  Surgeon: Algernon Huxley, MD;  Location: [place] [facility];  Service: Cardiovascular;  Laterality: Left;  . LOWER EXTREMITY INTERVENTION  09/01/2016   Procedure: Lower Extremity Intervention;  Surgeon: Algernon Huxley, MD;  Location: [place] [facility];  Service: Cardiovascular;;  . PROSTATE SURGERY     removal  . TONSILLECTOMY     as a child  There were no vitals filed for this visit.  Subjective Assessment - 07/12/17 1727    Subjective  Pt. met with Staci Righter a few days prior and prosthetic was internally rotated a few degrees at foot. Pt. states no pain today, but "pressure/discomfort" in residual limb. Pt. denies falls. Pt. remarks wearing prosthetic limb a couple times and some walking in house. Pt. states feeling some left foot pain a couple days ago, which PT educated pt. on phantom limb pain.    Patient is accompained by:  Family member    Limitations   Lifting;Standing;Walking;House hold activities    Patient Stated Goals  improve walking/ standing tolerance and safety.          Gait: Walk with RW focus on reciprocal gait and verbal cueing to reduce NBOS and toe drag in clinic and to car.  Ambulate in //-bars with R UE only and recip. 2-point gait pattern.  Cuing to maintain BOS (used blue line for visual cuing).   In //-barsForward weight shift L/R with 2-1 UE assist   There.ex: SciFit 10 min warm up (no charge) Supine L/R hip abd  x10/ quad sets/ SLR/ SAQ/bicycle 20x each. Sit stands from blue mat table 10x.   Standing hip flexion 10x L/R with 1 UE assist.       PT Education - 07/12/17 1728    Education provided  Yes    Education Details  PT educated on phantom limb pain    Person(s) Educated  Patient    Methods  Explanation    Comprehension  Verbalized understanding          PT Long Term Goals - 06/30/17 5784      PT LONG TERM GOAL #1   Title  Pt. able to transfer from w/c to least assistive device with mod. I and use of prosthesis safety.      Baseline  Mod. assist to stand with proper technique and use of prosthesis    Time  4    Period  Weeks    Status  New    Target Date  07/27/17      PT LONG TERM GOAL #2   Title  Pt. will increase FOTO to 49 to improve functional mobility.      Baseline  FOTO on 2/6: 34    Time  4    Period  Weeks    Status  New    Target Date  07/27/17      PT LONG TERM GOAL #3   Title  Pt. able to don/ doff L prosthesis with mod. I prior to PT tx. session to promote ability to walk to car/ ambulate into PT clinic.      Baseline  Pt. wearing prosthesis for 30 min. several times/day.      Time  4    Period  Weeks    Status  New    Target Date  07/27/17      PT LONG TERM GOAL #4   Title  Pt. able to ambulate 300 feet with use of least assistive device and prosthesis to improve safe functional mobility.      Baseline  pt. ambulates short distances in //-bars and with RW in PT  clinic with mod. A.      Time  4    Period  Weeks    Status  New    Target Date  07/27/17      PT LONG TERM GOAL #5   Title  Pt. able to amb. up/down ramp at home with mod. I and use of prosthesis safely.      Baseline  has not tried to walk on ramp at this time.     Time  4    Period  Weeks    Status  New    Target Date  07/27/17            Plan - 07/12/17 1730    Clinical Impression Statement  Pt. tolerated entire treatment with prosthetic limb on today, arrived to PT with limb on and left with limb on. Pt. had improved BOS in //-bars with 2 an 1 UE assist, but pt. still has NBOS in walker that improves with verbal cueing. Pt. still benefits from verbal cueing with gait to increase stride length, increase BOS, and increase hip flexion.    Clinical Presentation  Evolving    Clinical Decision Making  Moderate    Rehab Potential  Good    PT Frequency  2x / week    PT Duration  4 weeks    PT Treatment/Interventions  ADLs/Self Care Home Management;Therapeutic activities;Functional mobility training;Stair training;Gait training;Therapeutic exercise;Balance training;Neuromuscular re-education;Patient/family education;Manual techniques;Scar mobilization;Passive range of motion;Prosthetic Training    PT Next Visit Plan  Gait/ hip flexor strengthening/ Residual knee extension.  Assess residiual limb/ incision healing.     PT Home Exercise Plan  reviewed HEP    Consulted and Agree with Plan of Care  Patient       Patient will benefit from skilled therapeutic intervention in order to improve the following deficits and impairments:  Abnormal gait, Decreased balance, Decreased endurance, Decreased mobility, Decreased skin integrity, Difficulty walking, Improper body mechanics, Decreased range of motion, Decreased activity tolerance, Decreased strength, Decreased safety awareness, Impaired flexibility  Visit Diagnosis: Muscle weakness (generalized)  Gait difficulty  Complete below knee  amputation of left lower extremity, initial encounter [facility])     Problem List Patient Active Problem List   Diagnosis Date Noted  . BKA stump complication ([place]) 60/45/4098  . 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 S/P BKA (below knee amputation) unilateral, left ([place]) 02/01/2017  . Chronic diastolic heart failure ([place]) 01/21/2017  . Pressure injury of skin 01/18/2017  . 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 Diabetic foot ulcer ([place]) 12/29/2016  . Ataxia 10/21/2016  . History of femoral angiogram 09/06/2016  . Left leg pain 09/06/2016  . Leukocytosis 09/06/2016  . Generalized weakness 09/06/2016  . A-fib ([place]) 08/30/2016  . Pneumonia 05/19/2016  . Hyperlipidemia 04/20/2016  . Back pain 04/19/2016  . Type 2 diabetes mellitus treated with insulin ([place]) 04/19/2016  . Essential hypertension 04/19/2016  . PAD (peripheral artery disease) ([place]) 04/19/2016  . Erectile dysfunction following radical prostatectomy 06/25/2015  . History of prostate cancer 12/23/2014  . Incontinence 12/23/2014   Pura Spice, PT, DPT # [id number] 07/12/2017, 6:06 PM  [place] [facility] [address] [place], [place], [address] Phone: [phone]   Fax:  [phone]  Name: [name] MRN: [id number] Date of Birth: 03/18/1935

## 2017-07-14 ENCOUNTER — Ambulatory Visit: Payer: Medicare Other | Admitting: Physical Therapy

## 2017-07-14 ENCOUNTER — Encounter: Payer: Self-pay | Admitting: Physical Therapy

## 2017-07-14 DIAGNOSIS — R269 Unspecified abnormalities of gait and mobility: Secondary | ICD-10-CM

## 2017-07-14 DIAGNOSIS — S88112A Complete traumatic amputation at level between knee and ankle, left lower leg, initial encounter: Secondary | ICD-10-CM

## 2017-07-14 DIAGNOSIS — M6281 Muscle weakness (generalized): Secondary | ICD-10-CM | POA: Diagnosis not present
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
## 2017-07-19 ENCOUNTER — Encounter: Payer: Self-pay | Admitting: Physical Therapy

## 2017-07-19 ENCOUNTER — Ambulatory Visit: Payer: Medicare Other | Admitting: Physical Therapy

## 2017-07-19 DIAGNOSIS — S88112A Complete traumatic amputation at level between knee and ankle, left lower leg, initial encounter: Secondary | ICD-10-CM

## 2017-07-19 DIAGNOSIS — R269 Unspecified abnormalities of gait and mobility: Secondary | ICD-10-CM

## 2017-07-19 DIAGNOSIS — M6281 Muscle weakness (generalized): Secondary | ICD-10-CM

## 2017-07-19 NOTE — Therapy (Signed)
[facility] [address]. [place], [place], [address] Phone: [phone]   Fax:  [phone]  Physical Therapy Treatment  Patient Details  Name: [name] MRN: [id number] Date of Birth: January 29, 1935 Referring Provider: Dr. Leotis Pain   Encounter Date: 07/19/2017  PT End of Session - 07/19/17 1352    Visit Number  7    Number of Visits  8    Date for PT Re-Evaluation  07/27/17    PT Start Time  1026    PT Stop Time  1130    PT Time Calculation (min)  64 min    Equipment Utilized During Treatment  Gait belt    Activity Tolerance  Patient tolerated treatment well    Behavior During Therapy  [facility] for tasks assessed/performed       Past Medical History:  Diagnosis Date  . Arthritis   . Atrial fibrillation ([place])   . Carcinoma of prostate ([place])   . CHF (congestive heart failure) ([place])   . Chronic kidney disease   . Diabetes mellitus without complication ([place])   . ED (erectile dysfunction)   . Frequent urination   . Hyperlipidemia   . Hypertension   . Moderate mitral insufficiency   . Peripheral vascular disease ([place])   . Pneumonia 04/2016  . Prostate cancer ([place])   . Sleep apnea    OSA--USE C-PAP  . Ulcer of left foot due to type 2 diabetes mellitus ([place])   . Urinary stress incontinence, male     Past Surgical History:  Procedure Laterality Date  . AMPUTATION Left 01/12/2017   Procedure: AMPUTATION BELOW KNEE;  Surgeon: Algernon Huxley, MD;  Location: [facility]S;  Service: General;  Laterality: Left;  . APLIGRAFT PLACEMENT Left 10/15/2016   Procedure: APLIGRAFT PLACEMENT;  Surgeon: Albertine Patricia, DPM;  Location: [facility];  Service: Podiatry;  Laterality: Left;  necrotic ulcer  . CHOLECYSTECTOMY    . EYE SURGERY Bilateral    Cataract Extraction with IOL  . IRRIGATION AND DEBRIDEMENT FOOT Left 10/15/2016   Procedure: IRRIGATION AND DEBRIDEMENT FOOT-EXCISIONAL DEBRIDEMENT OF SKIN 3RD, 4TH AND 5TH TOES WITH APPLICATION OF  APLIGRAFT;  Surgeon: Albertine Patricia, DPM;  Location: [facility];  Service: Podiatry;  Laterality: Left;  necrotic,gangrene  . IRRIGATION AND DEBRIDEMENT FOOT Left 12/09/2016   Procedure: IRRIGATION AND DEBRIDEMENT FOOT-MUSCLE/FASCIA, RESECTION OF FOURTH AND FIFTH METATARSAL NECROTIC BONE;  Surgeon: Albertine Patricia, DPM;  Location: [facility];  Service: Podiatry;  Laterality: Left;  . LOWER EXTREMITY ANGIOGRAPHY Left 08/16/2016   Procedure: Lower Extremity Angiography;  Surgeon: Algernon Huxley, MD;  Location: [place] [facility];  Service: Cardiovascular;  Laterality: Left;  . LOWER EXTREMITY ANGIOGRAPHY Left 09/01/2016   Procedure: Lower Extremity Angiography;  Surgeon: Algernon Huxley, MD;  Location: [place] [facility];  Service: Cardiovascular;  Laterality: Left;  . LOWER EXTREMITY ANGIOGRAPHY Left 10/14/2016   Procedure: Lower Extremity Angiography;  Surgeon: Algernon Huxley, MD;  Location: [place] [facility];  Service: Cardiovascular;  Laterality: Left;  . LOWER EXTREMITY ANGIOGRAPHY Left 12/20/2016   Procedure: Lower Extremity Angiography;  Surgeon: Algernon Huxley, MD;  Location: [place] [facility];  Service: Cardiovascular;  Laterality: Left;  . LOWER EXTREMITY INTERVENTION  09/01/2016   Procedure: Lower Extremity Intervention;  Surgeon: Algernon Huxley, MD;  Location: [place] [facility];  Service: Cardiovascular;;  . PROSTATE SURGERY     removal  . TONSILLECTOMY     as a child  There were no vitals filed for this visit.  Subjective Assessment - 07/19/17 1046    Subjective  Pt. states feeling "alright", but "depressed" with prosthetic limb due to "how big it is". Pt. denies having any falls since last visit. Pt. ambulated into clinic today with prosthetic limb on and using RW. Pt. states he is ambulating at home with increased use of RW, but still using WC for longer distance ambulation tasks. Pt. is still afraid to walk with RW secondary to experiencing phantom limb pain 2 weeks prior during which  he "almost fell".    Patient is accompained by:  Family member    Limitations  Lifting;Standing;Walking;House hold activities    Patient Stated Goals  improve walking/ standing tolerance and safety.      Currently in Pain?  No/denies        Treatment:   Gait:   Walk with RW in PT gym progressing to [facility] with reciprocal gait and with little verbal cueing required.  Limited confidence/ stance time on L LE with SPC use.  Ambulate in //-bars with R UE only and recip. 2-point gait pattern.  Cuing to maintain BOS and upright posture (progressing to [facility]). In //-bars Forward weight shift to airex pad L/R with 2-1 UE assist.   Single leg stance in //-bars with 1 UE Min. a  There.ex:   SciFit L4-5 10 min B UE/LE (warm up/ no charge) Supine- L/R hip abd on towel for friction x10/ quad sets to SLR with prosthetic limb on L/R x10 Seated hip flexor marching with YTB around knee alternating L/R x10   //-bars monster walking with YTB around kneed forwards and lateral(L/R) x8      PT Long Term Goals - 06/30/17 5621      PT LONG TERM GOAL #1   Title  Pt. able to transfer from w/c to least assistive device with mod. I and use of prosthesis safety.      Baseline  Mod. assist to stand with proper technique and use of prosthesis    Time  4    Period  Weeks    Status  New    Target Date  07/27/17      PT LONG TERM GOAL #2   Title  Pt. will increase FOTO to 49 to improve functional mobility.      Baseline  FOTO on 2/6: 34    Time  4    Period  Weeks    Status  New    Target Date  07/27/17      PT LONG TERM GOAL #3   Title  Pt. able to don/ doff L prosthesis with mod. I prior to PT tx. session to promote ability to walk to car/ ambulate into PT clinic.      Baseline  Pt. wearing prosthesis for 30 min. several times/day.      Time  4    Period  Weeks    Status  New    Target Date  07/27/17      PT LONG TERM GOAL #4   Title  Pt. able to ambulate 300 feet with use of least assistive device  and prosthesis to improve safe functional mobility.      Baseline  pt. ambulates short distances in //-bars and with RW in PT clinic with mod. A.      Time  4    Period  Weeks    Status  New    Target Date  07/27/17  PT LONG TERM GOAL #5   Title  Pt. able to amb. up/down ramp at home with mod. I and use of prosthesis safely.      Baseline  has not tried to walk on ramp at this time.     Time  4    Period  Weeks    Status  New    Target Date  07/27/17         Plan - 07/19/17 1353    Clinical Impression Statement   Pt. fearful of walking/falling with use of SPC in PT clinic, but no longer fearful of 1 UE min use in //-bars.  Pt. ambulates with increased stance time on L compared to last visit with SPC.  Pt. has increased toe drag at end of appointment secondary to fatigue, but improves with verbal cueing. Pt. needs SBA to CGA with gait in //-bars and SPC. PT marks LE weakness B L>R especially in hamstring, and quads. PT will progress HEP next visit and reassess goals.     Clinical Presentation  Stable    Clinical Decision Making  Low    Rehab Potential  Good    PT Frequency  2x / week    PT Duration  4 weeks    PT Treatment/Interventions  ADLs/Self Care Home Management;Therapeutic activities;Functional mobility training;Stair training;Gait training;Therapeutic exercise;Balance training;Neuromuscular re-education;Patient/family education;Manual techniques;Scar mobilization;Passive range of motion;Prosthetic Training    PT Next Visit Plan  Gait training to least resistive device/ recertification/ LE strengthening.  CHECK GOALS.    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 BKA stump complication ([employer]) 41/93/7902  . 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 S/P BKA (below knee amputation) unilateral, left ([place]) 02/01/2017  . Chronic diastolic heart failure ([place]) 01/21/2017  . Pressure injury of skin 01/18/2017  . 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 Diabetic foot ulcer ([place]) 12/29/2016  . Ataxia 10/21/2016  . History of femoral angiogram 09/06/2016  . Left leg pain 09/06/2016  . Leukocytosis 09/06/2016  . Generalized weakness 09/06/2016  . A-fib ([place]) 08/30/2016  . Pneumonia 05/19/2016  . Hyperlipidemia 04/20/2016  . Back pain 04/19/2016  . Type 2 diabetes mellitus treated with insulin ([place]) 04/19/2016  . Essential hypertension 04/19/2016  . PAD (peripheral artery disease) ([place]) 04/19/2016  . Erectile dysfunction following radical prostatectomy 06/25/2015  . History of prostate cancer 12/23/2014  . Incontinence 12/23/2014   Pura Spice, PT, DPT # [id number] 07/19/2017, 5:46 PM  [facility] [address] [place], [place], [address] Phone: [phone]   Fax:  [phone]  Name: [name] MRN: [id number] Date of Birth: 16-Jul-1934

## 2017-07-21 ENCOUNTER — Ambulatory Visit: Payer: Medicare Other | Admitting: Physical Therapy

## 2017-07-21 ENCOUNTER — Encounter: Payer: Self-pay | Admitting: Physical Therapy

## 2017-07-21 DIAGNOSIS — M6281 Muscle weakness (generalized): Secondary | ICD-10-CM | POA: Diagnosis not present

## 2017-07-21 DIAGNOSIS — R269 Unspecified abnormalities of gait and mobility: Secondary | ICD-10-CM

## 2017-07-21 DIAGNOSIS — S88112A Complete traumatic amputation at level between knee and ankle, left lower leg, initial encounter: Secondary | ICD-10-CM

## 2017-07-21 NOTE — Therapy (Signed)
[facility] [address] [place], [place], [address] Phone: [phone]   Fax:  [phone]  Physical Therapy Treatment  Patient Details  Name: [name] MRN: [id number] Date of Birth: [birth year] Referring Provider: Dr. Leotis Pain   Encounter Date: 07/21/2017  PT End of Session - 07/21/17 1809    Visit Number  8    Number of Visits  8    Date for PT Re-Evaluation  07/27/17    PT Start Time  1025    PT Stop Time  1120    PT Time Calculation (min)  55 min    Equipment Utilized During Treatment  Gait belt    Activity Tolerance  Patient tolerated treatment well    Behavior During Therapy  [facility] for tasks assessed/performed       Past Medical History:  Diagnosis Date  . Arthritis   . Atrial fibrillation ([place])   . Carcinoma of prostate ([place])   . CHF (congestive heart failure) ([place])   . Chronic kidney disease   . Diabetes mellitus without complication ([place])   . ED (erectile dysfunction)   . Frequent urination   . Hyperlipidemia   . Hypertension   . Moderate mitral insufficiency   . Peripheral vascular disease ([place])   . Pneumonia 04/2016  . Prostate cancer ([place])   . Sleep apnea    OSA--USE C-PAP  . Ulcer of left foot due to type 2 diabetes mellitus ([place])   . Urinary stress incontinence, male     Past Surgical History:  Procedure Laterality Date  . AMPUTATION Left 01/12/2017   Procedure: AMPUTATION BELOW KNEE;  Surgeon: Algernon Huxley, MD;  Location: [facility];  Service: General;  Laterality: Left;  . APLIGRAFT PLACEMENT Left 10/15/2016   Procedure: APLIGRAFT PLACEMENT;  Surgeon: Albertine Patricia, DPM;  Location: [facility];  Service: Podiatry;  Laterality: Left;  necrotic ulcer  . CHOLECYSTECTOMY    . EYE SURGERY Bilateral    Cataract Extraction with IOL  . IRRIGATION AND DEBRIDEMENT FOOT Left 10/15/2016   Procedure: IRRIGATION AND DEBRIDEMENT FOOT-EXCISIONAL DEBRIDEMENT OF SKIN 3RD, 4TH AND 5TH TOES WITH APPLICATION OF  APLIGRAFT;  Surgeon: Albertine Patricia, DPM;  Location: [facility];  Service: Podiatry;  Laterality: Left;  necrotic,gangrene  . IRRIGATION AND DEBRIDEMENT FOOT Left 12/09/2016   Procedure: IRRIGATION AND DEBRIDEMENT FOOT-MUSCLE/FASCIA, RESECTION OF FOURTH AND FIFTH METATARSAL NECROTIC BONE;  Surgeon: Albertine Patricia, DPM;  Location: [facility];  Service: Podiatry;  Laterality: Left;  . LOWER EXTREMITY ANGIOGRAPHY Left 08/16/2016   Procedure: Lower Extremity Angiography;  Surgeon: Algernon Huxley, MD;  Location: [place] [facility];  Service: Cardiovascular;  Laterality: Left;  . LOWER EXTREMITY ANGIOGRAPHY Left 09/01/2016   Procedure: Lower Extremity Angiography;  Surgeon: Algernon Huxley, MD;  Location: [place] [facility];  Service: Cardiovascular;  Laterality: Left;  . LOWER EXTREMITY ANGIOGRAPHY Left 10/14/2016   Procedure: Lower Extremity Angiography;  Surgeon: Algernon Huxley, MD;  Location: [place] [facility];  Service: Cardiovascular;  Laterality: Left;  . LOWER EXTREMITY ANGIOGRAPHY Left 12/20/2016   Procedure: Lower Extremity Angiography;  Surgeon: Algernon Huxley, MD;  Location: [place] [facility];  Service: Cardiovascular;  Laterality: Left;  . LOWER EXTREMITY INTERVENTION  09/01/2016   Procedure: Lower Extremity Intervention;  Surgeon: Algernon Huxley, MD;  Location: [place] [facility];  Service: Cardiovascular;;  . PROSTATE SURGERY     removal  . TONSILLECTOMY     as a child  There were no vitals filed for this visit.  Subjective Assessment - 07/21/17 1803    Subjective  Pt. reports no pain today, and the residual limb looked "fine" to pt. before he put on prosthetic limb. Pt. walked into clinic with residual limb and RW. Pt. denies any falls, and no phantom limb pain. Pt. states that he was worried about walking with [facility] after last session.     Patient is accompained by:  Family member    Limitations  Lifting;Standing;Walking;House hold activities    Patient Stated Goals  improve walking/  standing tolerance and safety.      Currently in Pain?  No/denies        Gait:   Walk with RW in PT gym progressing to [facility] with reciprocal gait and with little verbal cueing required.  Limited confidence/ stance time on L LE with SPC use and LOB 3x PT Min a.  Ambulate in //-bars with R UE only and recip. 2-point gait pattern.  Cuing to maintain BOS and upright posture (progressing to [facility]). In //-bars Forward weight shift to airex pad L/R with 2-1 UE assist.   Single leg stance in //-bars with 1 UE Min. a   There.ex: SciFit L4-5 10 min B UE/LE (warm up/ no charge) //-bars 2 UE assist high knee marching x6     PT Long Term Goals - 06/30/17 1856      PT LONG TERM GOAL #1   Title  Pt. able to transfer from w/c to least assistive device with mod. I and use of prosthesis safety.      Baseline  Mod. assist to stand with proper technique and use of prosthesis    Time  4    Period  Weeks    Status  New    Target Date  07/27/17      PT LONG TERM GOAL #2   Title  Pt. will increase FOTO to 49 to improve functional mobility.      Baseline  FOTO on 2/6: 34    Time  4    Period  Weeks    Status  New    Target Date  07/27/17      PT LONG TERM GOAL #3   Title  Pt. able to don/ doff L prosthesis with mod. I prior to PT tx. session to promote ability to walk to car/ ambulate into PT clinic.      Baseline  Pt. wearing prosthesis for 30 min. several times/day.      Time  4    Period  Weeks    Status  New    Target Date  07/27/17      PT LONG TERM GOAL #4   Title  Pt. able to ambulate 300 feet with use of least assistive device and prosthesis to improve safe functional mobility.      Baseline  pt. ambulates short distances in //-bars and with RW in PT clinic with mod. A.      Time  4    Period  Weeks    Status  New    Target Date  07/27/17      PT LONG TERM GOAL #5   Title  Pt. able to amb. up/down ramp at home with mod. I and use of prosthesis safely.      Baseline  has not tried to  walk on ramp at this time.     Time  4    Period  Weeks  Status  New    Target Date  07/27/17            Plan - 07/21/17 1811    Clinical Impression Statement  Pt. ambulates with RW with improved hip flexion and BOS, but minimal toe drag noted. Pt. had increased fear with gait using SPC and lost balance 3 time, with PT min a. Pt. ambulates in //-bars wit 1 UE light touch no issues. Pt. shows improved endurance today and needed little to no breaks. Pt. able to single leg stance with 1 UE in //-bars with no increased pain or pressure in residual limb. Pt. educated to walk at home with RW and no longer use WC, but not to use SPC at home.    Clinical Presentation  Evolving    Clinical Decision Making  Moderate    Rehab Potential  Good    PT Frequency  2x / week    PT Duration  4 weeks    PT Treatment/Interventions  ADLs/Self Care Home Management;Therapeutic activities;Functional mobility training;Stair training;Gait training;Therapeutic exercise;Balance training;Neuromuscular re-education;Patient/family education;Manual techniques;Scar mobilization;Passive range of motion;Prosthetic Training    PT Next Visit Plan  Gait training to least resistive device/ recertification/ LE strengthening.  CHECK GOALS.    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 BKA stump complication ([place]) 77/82/4235  . 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 S/P BKA (below knee amputation) unilateral, left ([place]) 02/01/2017  . Chronic diastolic heart failure ([place]) 01/21/2017  . Pressure injury of skin 01/18/2017  . 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 Diabetic foot ulcer ([place]) 12/29/2016  . Ataxia 10/21/2016  . History of femoral angiogram 09/06/2016  . Left leg pain 09/06/2016  . Leukocytosis 09/06/2016  . Generalized weakness 09/06/2016  . A-fib ([place]) 08/30/2016  . Pneumonia 05/19/2016  . Hyperlipidemia 04/20/2016  . Back pain 04/19/2016  . Type 2 diabetes mellitus treated with insulin ([place]) 04/19/2016  . Essential hypertension 04/19/2016  . PAD (peripheral artery disease) ([place]) 04/19/2016  . Erectile dysfunction following radical prostatectomy 06/25/2015  . History of prostate cancer 12/23/2014  . Incontinence 12/23/2014   Pura Spice, PT, DPT # [id number] Rosario Adie, SPT 07/21/2017, 6:28 PM  [facility] [address] [place], [place], [address] Phone: [phone]   Fax:  [phone]  Name: [name] MRN: [id number] Date of Birth: Oct 11, 1934

## 2017-07-22 ENCOUNTER — Encounter (INDEPENDENT_AMBULATORY_CARE_PROVIDER_SITE_OTHER): Payer: Self-pay | Admitting: Vascular Surgery

## 2017-07-22 ENCOUNTER — Ambulatory Visit (INDEPENDENT_AMBULATORY_CARE_PROVIDER_SITE_OTHER): Payer: Medicare Other | Admitting: Vascular Surgery

## 2017-07-22 VITALS — BP 154/110 | HR 118 | Resp 19 | Ht 67.0 in | Wt 175.0 lb

## 2017-07-22 DIAGNOSIS — Z89512 Acquired absence of left leg below knee: Secondary | ICD-10-CM

## 2017-07-22 DIAGNOSIS — Z794 Long term (current) use of insulin: Secondary | ICD-10-CM | POA: Diagnosis not present

## 2017-07-22 DIAGNOSIS — E785 Hyperlipidemia, unspecified: Secondary | ICD-10-CM | POA: Diagnosis not present

## 2017-07-22 DIAGNOSIS — E119 Type 2 diabetes mellitus without complications: Secondary | ICD-10-CM | POA: Diagnosis not present

## 2017-07-22 DIAGNOSIS — I739 Peripheral vascular disease, unspecified: Secondary | ICD-10-CM

## 2017-07-22 NOTE — Progress Notes (Signed)
Subjective:    Patient ID: [name], male    DOB: Jul 28, 1934, 83 y.o.   MRN: [id number] Chief Complaint  Patient presents with  . Follow-up    3-4 month f/u   The patient was last seen on November the eighth 2018 and evaluation of his healing left below the knee amputation.  The patient seen with wife today.  The patient is now wearing a full prosthesis.  He is able to walk with a walker and is now transitioning to walking with a cane.  The patient denies any issues with his left below-knee amputation stump.  Dates the skin is intact and continues to heal.  The patient denies any right lower extremity symptoms such as claudication, rest pain or ulceration.  The patient's last ABI was March 2018.  The patient denies any fever, nausea vomiting.   Review of Systems  Constitutional: Negative.   HENT: Negative.   Eyes: Negative.   Respiratory: Negative.   Cardiovascular:       PAD Left BKA  Gastrointestinal: Negative.   Endocrine: Negative.   Genitourinary: Negative.   Musculoskeletal: Negative.   Skin: Negative.   Allergic/Immunologic: Negative.   Neurological: Negative.   Hematological: Negative.   Psychiatric/Behavioral: Negative.       Objective:   Physical Exam  Constitutional: He is oriented to person, place, and time. He appears well-developed and well-nourished. No distress.  HENT:  Head: Normocephalic and atraumatic.  Eyes: Conjunctivae are normal. Pupils are equal, round, and reactive to light.  Neck: Normal range of motion.  Cardiovascular: Normal rate, normal heart sounds and intact distal pulses.  Pulses:      Radial pulses are 2+ on the right side, and 2+ on the left side.       Dorsalis pedis pulses are 1+ on the right side.       Posterior tibial pulses are 1+ on the right side.  Left lower extremity BKA: Healthy.  Skin is intact.  Pulmonary/Chest: Effort normal and breath sounds normal.  Musculoskeletal: Normal range of motion. He exhibits no edema.    Neurological: He is alert and oriented to person, place, and time.  Skin: Skin is warm and dry. He is not diaphoretic.  Psychiatric: He has a normal mood and affect. His behavior is normal. Judgment and thought content normal.  Vitals reviewed.  Pulse 76   Resp 19   Ht 5\' 7"  (1.702 m)   Wt 175 lb (79.4 kg)   BMI 27.41 kg/m   Past Medical History:  Diagnosis Date  . Arthritis   . Atrial fibrillation ([place])   . Carcinoma of prostate ([place])   . CHF (congestive heart failure) ([place])   . Chronic kidney disease   . Diabetes mellitus without complication ([place])   . ED (erectile dysfunction)   . Frequent urination   . Hyperlipidemia   . Hypertension   . Moderate mitral insufficiency   . Peripheral vascular disease ([place])   . Pneumonia 04/2016  . Prostate cancer ([place])   . Sleep apnea    OSA--USE C-PAP  . Ulcer of left foot due to type 2 diabetes mellitus ([place])   . Urinary stress incontinence, male    Social History   Socioeconomic History  . Marital status: Married    Spouse name: Not on file  . Number of children: Not on file  . Years of education: Not on file  . Highest education level: Not on file  Social Needs  . Financial  resource strain: Not on file  . Food insecurity - worry: Not on file  . Food insecurity - inability: Not on file  . Transportation needs - medical: Not on file  . Transportation needs - non-medical: Not on file  Occupational History  . Occupation: retired  Tobacco Use  . Smoking status: Former Smoker    Packs/day: 0.25    Types: Cigarettes    Last attempt to quit: 10/14/1994    Years since quitting: 22.7  . Smokeless tobacco: Never Used  Substance and Sexual Activity  . Alcohol use: No    Alcohol/week: 0.0 oz  . Drug use: No  . Sexual activity: Not on file  Other Topics Concern  . Not on file  Social History Narrative  . Not on file   Past Surgical History:  Procedure Laterality Date  . AMPUTATION Left 01/12/2017   Procedure: AMPUTATION  BELOW KNEE;  Surgeon: Algernon Huxley, MD;  Location: [facility];  Service: General;  Laterality: Left;  . APLIGRAFT PLACEMENT Left 10/15/2016   Procedure: APLIGRAFT PLACEMENT;  Surgeon: Albertine Patricia, DPM;  Location: [facility];  Service: Podiatry;  Laterality: Left;  necrotic ulcer  . CHOLECYSTECTOMY    . EYE SURGERY Bilateral    Cataract Extraction with IOL  . IRRIGATION AND DEBRIDEMENT FOOT Left 10/15/2016   Procedure: IRRIGATION AND DEBRIDEMENT FOOT-EXCISIONAL DEBRIDEMENT OF SKIN 3RD, 4TH AND 5TH TOES WITH APPLICATION OF APLIGRAFT;  Surgeon: Albertine Patricia, DPM;  Location: [facility];  Service: Podiatry;  Laterality: Left;  necrotic,gangrene  . IRRIGATION AND DEBRIDEMENT FOOT Left 12/09/2016   Procedure: IRRIGATION AND DEBRIDEMENT FOOT-MUSCLE/FASCIA, RESECTION OF FOURTH AND FIFTH METATARSAL NECROTIC BONE;  Surgeon: Albertine Patricia, DPM;  Location: [facility];  Service: Podiatry;  Laterality: Left;  . LOWER EXTREMITY ANGIOGRAPHY Left 08/16/2016   Procedure: Lower Extremity Angiography;  Surgeon: Algernon Huxley, MD;  Location: [place] [facility];  Service: Cardiovascular;  Laterality: Left;  . LOWER EXTREMITY ANGIOGRAPHY Left 09/01/2016   Procedure: Lower Extremity Angiography;  Surgeon: Algernon Huxley, MD;  Location: [place] [facility];  Service: Cardiovascular;  Laterality: Left;  . LOWER EXTREMITY ANGIOGRAPHY Left 10/14/2016   Procedure: Lower Extremity Angiography;  Surgeon: Algernon Huxley, MD;  Location: [place] [facility];  Service: Cardiovascular;  Laterality: Left;  . LOWER EXTREMITY ANGIOGRAPHY Left 12/20/2016   Procedure: Lower Extremity Angiography;  Surgeon: Algernon Huxley, MD;  Location: [place] [facility];  Service: Cardiovascular;  Laterality: Left;  . LOWER EXTREMITY INTERVENTION  09/01/2016   Procedure: Lower Extremity Intervention;  Surgeon: Algernon Huxley, MD;  Location: [place] [facility];  Service: Cardiovascular;;  . PROSTATE SURGERY     removal  . TONSILLECTOMY     as a child    Family History  Problem Relation Age of Onset  . Hypertension Mother   . Diabetes Mother   . Prostate cancer Neg Hx   . Bladder Cancer Neg Hx   . Kidney disease Neg Hx    Allergies  Allergen Reactions  . Contrast Media [Iodinated Diagnostic Agents] Shortness Of Breath    SOB  . Iohexol Shortness Of Breath     Desc: Respiratory Distress, Laryngedema, diaphoresis, Onset Date: [id number]   . Metrizamide Shortness Of Breath    SOB SOB SOB   . Latex Rash      Assessment & Plan:  The patient was last seen on November the eighth 2018 and evaluation of his healing left below the knee amputation.  The patient  seen with wife today.  The patient is now wearing a full prosthesis.  He is able to walk with a walker and is now transitioning to walking with a cane.  The patient denies any issues with his left below-knee amputation stump.  Dates the skin is intact and continues to heal.  The patient denies any right lower extremity symptoms such as claudication, rest pain or ulceration.  The patient's last ABI was March 2018.  The patient denies any fever, nausea vomiting.  1. PAD (peripheral artery disease) ([place]) - Stable Patient with multiple risk factors for peripheral artery disease The patient's last ABI was March 2018 I will bring the patient back in approximately 1 month to undergo bilateral ABI to assess the degree of peripheral artery disease to the right lower extremity. I have discussed with the patient at length the risk factors for and pathogenesis of atherosclerotic disease and encouraged a healthy diet, regular exercise regimen and blood pressure / glucose control.  The patient was encouraged to call the office in the interim if he experiences any claudication like symptoms, rest pain or ulcers to his feet / toes.  - VAS [place] ABI WITH/WO TBI; Future  2. S/P BKA (below knee amputation) unilateral, left (HCC) - Stable The patient's below-knee amputation site is healing well. The  patient is now on a full prosthetic and is transitioning from walking with a walker to walking with a cane Physical exam is unremarkable.  Stump is healing well.  Skin is intact.  3. Type 2 diabetes mellitus treated with insulin ([place]) - Stable Encouraged good control as its slows the progression of atherosclerotic disease  4. Hyperlipidemia, unspecified hyperlipidemia type - Stable Encouraged good control as its slows the progression of atherosclerotic disease  Current Outpatient Medications on File Prior to Visit  Medication Sig Dispense Refill  . atorvastatin (LIPITOR) 80 MG tablet Take 80 mg by mouth daily at 6 PM.     . furosemide (LASIX) 20 MG tablet Take 1 tablet (20 mg total) by mouth daily. 30 tablet 0  . insulin aspart (NOVOLOG) 100 UNIT/ML injection Inject 0-5 Units into the skin 3 (three) times daily with meals.     . insulin glargine (LANTUS) 100 UNIT/ML injection Inject 0.1 mLs (10 Units total) into the skin at bedtime. 10 mL 11  . JARDIANCE 10 MG TABS tablet     . lamoTRIgine (LAMICTAL) 25 MG tablet Take by mouth.    . metFORMIN (GLUCOPHAGE) 1000 MG tablet TAKE 1 TABLET TWICE A DAY WITH MEALS    . Rivaroxaban (XARELTO) 15 MG TABS tablet Take by mouth.    . silver sulfADIAZINE (SILVADENE) 1 % cream Apply topically daily. 50 g 0  . TOPROL XL 100 MG 24 hr tablet Take 100 mg by mouth daily.    . TRULICITY 4.54 UJ/8.1XB SOPN 0.75 mg once a week.    . digoxin (LANOXIN) 0.125 MG tablet Take 1 tablet (0.125 mg total) by mouth daily. (Patient not taking: Reported on 07/22/2017)    . diltiazem (CARDIZEM CD) 300 MG 24 hr capsule Take 300 mg by mouth daily.    Marland Kitchen lubiprostone (AMITIZA) 24 MCG capsule Take by mouth.    . oxyCODONE (OXY IR/ROXICODONE) 5 MG immediate release tablet Take 1 tablet (5 mg total) by mouth every 6 (six) hours as needed for moderate pain. (Patient not taking: Reported on 07/22/2017) 30 tablet 0  . oxyCODONE (OXYCONTIN) 10 mg 12 hr tablet Take 1 tablet (10 mg total) by  mouth every 12 (twelve) hours. (Patient not taking: Reported on 07/22/2017) 14 tablet 0  . potassium chloride SA (K-DUR,KLOR-CON) 20 MEQ tablet Take by mouth.     Current Facility-Administered Medications on File Prior to Visit  Medication Dose Route Frequency Provider Last Rate Last Dose  . acetaminophen (TYLENOL) tablet 650 mg  650 mg Oral Q6H PRN Alixis Harmon A, PA-C       Or  . acetaminophen (TYLENOL) suppository 650 mg  650 mg Rectal Q6H PRN Oneda Duffett A, PA-C      . docusate sodium (COLACE) capsule 100 mg  100 mg Oral BID Evo Aderman A, PA-C      . heparin injection 5,000 Units  5,000 Units Subcutaneous Q8H Raimi Guillermo A, PA-C      . ondansetron (ZOFRAN) tablet 4 mg  4 mg Oral Q6H PRN Linzie Boursiquot A, PA-C       Or  . ondansetron (ZOFRAN) 4 mg in sodium chloride 0.9 % 50 mL IVPB  4 mg Intravenous Q6H PRN Aayat Hajjar A, PA-C      . piperacillin-tazobactam (ZOSYN) 3.375 g in dextrose 5 % 50 mL IVPB  3.375 g Intravenous Q6H Haston Casebolt A, PA-C      . senna (SENOKOT) tablet 8.6 mg  1 tablet Oral Daily PRN Vash Quezada A, PA-C       There are no Patient Instructions on file for this visit. No Follow-up on file.  Nysia Dell A Orrie Schubert, PA-C

## 2017-07-26 ENCOUNTER — Encounter: Payer: Self-pay | Admitting: Physical Therapy

## 2017-07-26 ENCOUNTER — Ambulatory Visit: Payer: Medicare Other | Attending: Vascular Surgery | Admitting: Physical Therapy

## 2017-07-26 DIAGNOSIS — Z89512 Acquired absence of left leg below knee: Secondary | ICD-10-CM | POA: Diagnosis not present

## 2017-07-26 DIAGNOSIS — R269 Unspecified abnormalities of gait and mobility: Secondary | ICD-10-CM | POA: Diagnosis not present

## 2017-07-26 DIAGNOSIS — S88112A Complete traumatic amputation at level between knee and ankle, left lower leg, initial encounter: Secondary | ICD-10-CM

## 2017-07-26 DIAGNOSIS — M6281 Muscle weakness (generalized): Secondary | ICD-10-CM | POA: Diagnosis present

## 2017-07-26 NOTE — Therapy (Signed)
[facility] [address] [place], [place], [address] Phone: [phone]   Fax:  [phone]  Physical Therapy Treatment  Patient Details  Name: [name] MRN: [id number] Date of Birth: 31-Oct-1934 Referring Provider: Dr. Leotis Pain   Encounter Date: 07/26/2017  PT End of Session - 07/26/17 1035    Visit Number  9    Number of Visits  17    Date for PT Re-Evaluation  08/23/17    PT Start Time  1024    PT Stop Time  1131    PT Time Calculation (min)  67 min    Equipment Utilized During Treatment  Gait belt    Activity Tolerance  Patient tolerated treatment well    Behavior During Therapy  [facility] for tasks assessed/performed       Past Medical History:  Diagnosis Date  . Arthritis   . Atrial fibrillation ([place])   . Carcinoma of prostate ([place])   . CHF (congestive heart failure) ([place])   . Chronic kidney disease   . Diabetes mellitus without complication ([place])   . ED (erectile dysfunction)   . Frequent urination   . Hyperlipidemia   . Hypertension   . Moderate mitral insufficiency   . Peripheral vascular disease ([place])   . Pneumonia 04/2016  . Prostate cancer ([place])   . Sleep apnea    OSA--USE C-PAP  . Ulcer of left foot due to type 2 diabetes mellitus ([place])   . Urinary stress incontinence, male     Past Surgical History:  Procedure Laterality Date  . AMPUTATION Left 01/12/2017   Procedure: AMPUTATION BELOW KNEE;  Surgeon: Algernon Huxley, MD;  Location: [facility];  Service: General;  Laterality: Left;  . APLIGRAFT PLACEMENT Left 10/15/2016   Procedure: APLIGRAFT PLACEMENT;  Surgeon: Albertine Patricia, DPM;  Location: [facility];  Service: Podiatry;  Laterality: Left;  necrotic ulcer  . CHOLECYSTECTOMY    . EYE SURGERY Bilateral    Cataract Extraction with IOL  . IRRIGATION AND DEBRIDEMENT FOOT Left 10/15/2016   Procedure: IRRIGATION AND DEBRIDEMENT FOOT-EXCISIONAL DEBRIDEMENT OF SKIN 3RD, 4TH AND 5TH TOES WITH APPLICATION OF  APLIGRAFT;  Surgeon: Albertine Patricia, DPM;  Location: [facility];  Service: Podiatry;  Laterality: Left;  necrotic,gangrene  . IRRIGATION AND DEBRIDEMENT FOOT Left 12/09/2016   Procedure: IRRIGATION AND DEBRIDEMENT FOOT-MUSCLE/FASCIA, RESECTION OF FOURTH AND FIFTH METATARSAL NECROTIC BONE;  Surgeon: Albertine Patricia, DPM;  Location: [facility];  Service: Podiatry;  Laterality: Left;  . LOWER EXTREMITY ANGIOGRAPHY Left 08/16/2016   Procedure: Lower Extremity Angiography;  Surgeon: Algernon Huxley, MD;  Location: [place] [facility];  Service: Cardiovascular;  Laterality: Left;  . LOWER EXTREMITY ANGIOGRAPHY Left 09/01/2016   Procedure: Lower Extremity Angiography;  Surgeon: Algernon Huxley, MD;  Location: [place] [facility];  Service: Cardiovascular;  Laterality: Left;  . LOWER EXTREMITY ANGIOGRAPHY Left 10/14/2016   Procedure: Lower Extremity Angiography;  Surgeon: Algernon Huxley, MD;  Location: [place] [facility];  Service: Cardiovascular;  Laterality: Left;  . LOWER EXTREMITY ANGIOGRAPHY Left 12/20/2016   Procedure: Lower Extremity Angiography;  Surgeon: Algernon Huxley, MD;  Location: [place] [facility];  Service: Cardiovascular;  Laterality: Left;  . LOWER EXTREMITY INTERVENTION  09/01/2016   Procedure: Lower Extremity Intervention;  Surgeon: Algernon Huxley, MD;  Location: [place] [facility];  Service: Cardiovascular;;  . PROSTATE SURGERY     removal  . TONSILLECTOMY     as a child  There were no vitals filed for this visit.  Subjective Assessment - 07/26/17 1035    Subjective  Pt. states he is doing alright and reports no pain.      Patient is accompained by:  Family member    Limitations  Lifting;Standing;Walking;House hold activities    Patient Stated Goals  improve walking/ standing tolerance and safety.      Currently in Pain?  No/denies       There.ex:  Nustep L510 min B UE/LE (warm up/no charge) Step ups L LE 10x2 with 1 UE.  //-bars 2 UE assist high knee marching  x8  Neuro:  Forward/backward/lateral walking with 1 UE assist and cuing for proper upright posture. Standing posture correction with mirror feedback Turning with 1 UE to no UE assist L/R Tandem stance/ gait.     Gait:  Walk with RWin PT gym progressing to [facility] withreciprocal gait and with min verbal cueing required. Limited confidence/ stance time on L LE with SPC use (no LOB but fearful).  Ambulate in //-bars with R UE only and recip. 2-point gait pattern. Cuing to maintain BOS and upright posture (progressing to [facility]). Amb. In hallway and outside with use of RW progressing to [facility].      PT Long Term Goals - 07/26/17 2036      PT LONG TERM GOAL #1   Title  Pt. able to transfer from w/c to least assistive device with mod. I and use of prosthesis safety.      Baseline  mod. I with use of RW or SPC    Time  4    Period  Weeks    Status  Achieved    Target Date  07/26/17      PT LONG TERM GOAL #2   Title  Pt. will increase FOTO to 49 to improve functional mobility.      Baseline  FOTO on 2/6: 34.  3/4: 49    Time  4    Period  Weeks    Status  Achieved    Target Date  07/26/17      PT LONG TERM GOAL #3   Title  Pt. able to don/ doff L prosthesis with mod. I prior to PT tx. session to promote ability to walk to car/ ambulate into PT clinic.      Baseline  mod. I for extra time.     Time  4    Period  Weeks    Status  Achieved    Target Date  07/26/17      PT LONG TERM GOAL #4   Title  Pt. able to ambulate 300 feet with use of least assistive device and prosthesis to improve safe functional mobility.      Baseline  pt. ambulates approx. 100 feet with RW and mod./ SBA or SPC and min. A for safety/ verbal cuing.     Time  4    Period  Weeks    Status  Partially Met    Target Date  08/23/17      PT LONG TERM GOAL #5   Title  Pt. able to amb. up/down ramp at home with mod. I and use of prosthesis safely.      Baseline  use of RW and SBA for safety    Time  4     Period  Weeks    Status  Partially Met    Target Date  08/23/17      Additional Long Term  Goals   Additional Long Term Goals  Yes      PT LONG TERM GOAL #6   Title  Pt. able to amb. community distance with use of SPC on level surfaces safety with no LOB.     Baseline  pt. able to amb. with SPC and min. A for safety.  Pt. fearful of falling.     Time  4    Period  Weeks    Status  New    Target Date  08/23/17            Plan - 07/26/17 1036    Clinical Impression Statement  Pt. ambulates with mod. I/SBA and RW with improved hip flexion and BOS.  Pt. has started to progress to [facility] with min. A from PT for verbal cuing to stand upright/ amb. with recip. pattern.  Pt. ambulates in //-bars wit 1 UE light touch no issues. Pt. shows improved endurance today and needed little to no breaks. Pt. able to single leg stance with 1 UE in //-bars with no increased pain or pressure in residual limb.  Recip step pattern with B handrails on 4 steps.  FOTO: 49 (marked improvement).      Clinical Presentation  Evolving    Clinical Decision Making  Moderate    Rehab Potential  Good    PT Frequency  2x / week    PT Duration  4 weeks    PT Treatment/Interventions  ADLs/Self Care Home Management;Therapeutic activities;Functional mobility training;Stair training;Gait training;Therapeutic exercise;Balance training;Neuromuscular re-education;Patient/family education;Manual techniques;Scar mobilization;Passive range of motion;Prosthetic Training    PT Next Visit Plan  Gait training to least resistive device/ LE strengthening.      PT Home Exercise Plan  reviewed HEP    Consulted and Agree with Plan of Care  Patient       Patient will benefit from skilled therapeutic intervention in order to improve the following deficits and impairments:  Abnormal gait, Decreased balance, Decreased endurance, Decreased mobility, Decreased skin integrity, Difficulty walking, Improper body mechanics, Decreased range of motion,  Decreased activity tolerance, Decreased strength, Decreased safety awareness, Impaired flexibility  Visit Diagnosis: Muscle weakness (generalized)  Gait difficulty  Complete below knee amputation of left lower extremity, initial encounter [facility])     Problem List Patient Active Problem List   Diagnosis Date Noted  . BKA stump complication ([place]) 61/51/8343  . 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 S/P BKA (below knee amputation) unilateral, left ([place]) 02/01/2017  . Chronic diastolic heart failure ([place]) 01/21/2017  . Pressure injury of skin 01/18/2017  . 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 Diabetic foot ulcer ([place]) 12/29/2016  . Ataxia 10/21/2016  . History of femoral angiogram 09/06/2016  . Left leg pain 09/06/2016  . Leukocytosis 09/06/2016  . Generalized weakness 09/06/2016  . A-fib ([place]) 08/30/2016  . Pneumonia 05/19/2016  . Hyperlipidemia 04/20/2016  . Back pain 04/19/2016  . Type 2 diabetes mellitus treated with insulin ([place]) 04/19/2016  . Essential hypertension 04/19/2016  . PAD (peripheral artery disease) ([place]) 04/19/2016  . Erectile dysfunction following radical prostatectomy 06/25/2015  . History of prostate cancer 12/23/2014  . Incontinence 12/23/2014   Pura Spice, PT, DPT # [phone] 07/26/2017, 8:42 PM  [facility] [address] [place], [place], [address] Phone: [phone]   Fax:  [phone]  Name: [name] MRN: [id number] Date of Birth: [birth year]

## 2017-07-28 ENCOUNTER — Encounter: Payer: Medicare Other | Admitting: Physical Therapy

## 2017-08-02 ENCOUNTER — Ambulatory Visit: Payer: Medicare Other | Admitting: Physical Therapy

## 2017-08-02 ENCOUNTER — Encounter: Payer: Self-pay | Admitting: Physical Therapy

## 2017-08-02 DIAGNOSIS — R269 Unspecified abnormalities of gait and mobility: Secondary | ICD-10-CM

## 2017-08-02 DIAGNOSIS — S88112A Complete traumatic amputation at level between knee and ankle, left lower leg, initial encounter: Secondary | ICD-10-CM

## 2017-08-02 DIAGNOSIS — M6281 Muscle weakness (generalized): Secondary | ICD-10-CM

## 2017-08-04 ENCOUNTER — Ambulatory Visit: Payer: Medicare Other | Admitting: Physical Therapy

## 2017-08-05 ENCOUNTER — Encounter: Payer: Self-pay | Admitting: Physical Therapy

## 2017-08-05 ENCOUNTER — Ambulatory Visit: Payer: Medicare Other | Admitting: Physical Therapy

## 2017-08-05 DIAGNOSIS — R269 Unspecified abnormalities of gait and mobility: Secondary | ICD-10-CM

## 2017-08-05 DIAGNOSIS — M6281 Muscle weakness (generalized): Secondary | ICD-10-CM

## 2017-08-05 DIAGNOSIS — S88112A Complete traumatic amputation at level between knee and ankle, left lower leg, initial encounter: Secondary | ICD-10-CM

## 2017-08-05 NOTE — Therapy (Signed)
[facility] [address]. [place], [place], [address] Phone: [phone]   Fax:  [phone]  Physical Therapy Treatment  Patient Details  Name: [name] MRN: [id number] Date of Birth: 1935-04-18 Referring Provider: Dr. Leotis Pain   Encounter Date: 08/05/2017  PT End of Session - 08/05/17 0823    Visit Number  11    Number of Visits  17    Date for PT Re-Evaluation  08/23/17    PT Start Time  0816    PT Stop Time  0918    PT Time Calculation (min)  62 min    Equipment Utilized During Treatment  Gait belt    Activity Tolerance  Patient tolerated treatment well    Behavior During Therapy  [facility] for tasks assessed/performed       Past Medical History:  Diagnosis Date  . Arthritis   . Atrial fibrillation ([place])   . Carcinoma of prostate ([place])   . CHF (congestive heart failure) ([place])   . Chronic kidney disease   . Diabetes mellitus without complication ([place])   . ED (erectile dysfunction)   . Frequent urination   . Hyperlipidemia   . Hypertension   . Moderate mitral insufficiency   . Peripheral vascular disease ([place])   . Pneumonia 04/2016  . Prostate cancer ([place])   . Sleep apnea    OSA--USE C-PAP  . Ulcer of left foot due to type 2 diabetes mellitus ([place])   . Urinary stress incontinence, male     Past Surgical History:  Procedure Laterality Date  . AMPUTATION Left 01/12/2017   Procedure: AMPUTATION BELOW KNEE;  Surgeon: Algernon Huxley, MD;  Location: [facility] ORS;  Service: General;  Laterality: Left;  . APLIGRAFT PLACEMENT Left 10/15/2016   Procedure: APLIGRAFT PLACEMENT;  Surgeon: Albertine Patricia, DPM;  Location: [facility];  Service: Podiatry;  Laterality: Left;  necrotic ulcer  . CHOLECYSTECTOMY    . EYE SURGERY Bilateral    Cataract Extraction with IOL  . IRRIGATION AND DEBRIDEMENT FOOT Left 10/15/2016   Procedure: IRRIGATION AND DEBRIDEMENT FOOT-EXCISIONAL DEBRIDEMENT OF SKIN 3RD, 4TH AND 5TH TOES WITH APPLICATION OF  APLIGRAFT;  Surgeon: Albertine Patricia, DPM;  Location: [facility];  Service: Podiatry;  Laterality: Left;  necrotic,gangrene  . IRRIGATION AND DEBRIDEMENT FOOT Left 12/09/2016   Procedure: IRRIGATION AND DEBRIDEMENT FOOT-MUSCLE/FASCIA, RESECTION OF FOURTH AND FIFTH METATARSAL NECROTIC BONE;  Surgeon: Albertine Patricia, DPM;  Location: [facility];  Service: Podiatry;  Laterality: Left;  . LOWER EXTREMITY ANGIOGRAPHY Left 08/16/2016   Procedure: Lower Extremity Angiography;  Surgeon: Algernon Huxley, MD;  Location: [place] [facility];  Service: Cardiovascular;  Laterality: Left;  . LOWER EXTREMITY ANGIOGRAPHY Left 09/01/2016   Procedure: Lower Extremity Angiography;  Surgeon: Algernon Huxley, MD;  Location: [place] [facility];  Service: Cardiovascular;  Laterality: Left;  . LOWER EXTREMITY ANGIOGRAPHY Left 10/14/2016   Procedure: Lower Extremity Angiography;  Surgeon: Algernon Huxley, MD;  Location: [place] [facility];  Service: Cardiovascular;  Laterality: Left;  . LOWER EXTREMITY ANGIOGRAPHY Left 12/20/2016   Procedure: Lower Extremity Angiography;  Surgeon: Algernon Huxley, MD;  Location: [place] [facility];  Service: Cardiovascular;  Laterality: Left;  . LOWER EXTREMITY INTERVENTION  09/01/2016   Procedure: Lower Extremity Intervention;  Surgeon: Algernon Huxley, MD;  Location: [place] [facility];  Service: Cardiovascular;;  . PROSTATE SURGERY     removal  . TONSILLECTOMY     as a child  There were no vitals filed for this visit.  Subjective Assessment - 08/05/17 0823    Subjective  Pt. entered PT with use of SPC and SBA from PT.  Pt. reports prosthesis is not feeling comfortable.  Pt. is considering calling Staci Righter to discuss.       Patient is accompained by:  Family member    Limitations  Lifting;Standing;Walking;House hold activities    Patient Stated Goals  improve walking/ standing tolerance and safety.      Currently in Pain?  Yes    Pain Score  5     Pain Location  Leg    Pain Orientation   Left         There.ex:  Nustep L610 min B UE/LE (warm up/no charge) Step ups L LE 20x with 1 UE (moderate cuing to correct posture)- pt. Improved with technique as tx. Progressed.  SBA for cuing.  Seated LAQ with added L hamstring stretches (static holds as tolerated). //-bars 2 UE assist high knee marching x10  Neuro:  Forward/backward/lateral walking with 1 UE assist and cuing for proper upright posture. Airex walking (step ups/overs/wt. Shifting).   Turning with 1 UE to no UE assist L/R Resisted walking 1BTB with min. To no UE assist 5x all planes.  Moderate cuing to stand upright/ use mirror.       Gait:  Ambulate in //-bars with R UE only and recip. 2-point gait pattern. Cuing to maintain BOS and upright posture (progressing to [facility]). Amb. In hallway and outside with use of SPC only today (no RW).   320 feet today with no LOB but fatigue noted.       PT Long Term Goals - 07/26/17 2036      PT LONG TERM GOAL #1   Title  Pt. able to transfer from w/c to least assistive device with mod. I and use of prosthesis safety.      Baseline  mod. I with use of RW or SPC    Time  4    Period  Weeks    Status  Achieved    Target Date  07/26/17      PT LONG TERM GOAL #2   Title  Pt. will increase FOTO to 49 to improve functional mobility.      Baseline  FOTO on 2/6: 34.  3/4: 49    Time  4    Period  Weeks    Status  Achieved    Target Date  07/26/17      PT LONG TERM GOAL #3   Title  Pt. able to don/ doff L prosthesis with mod. I prior to PT tx. session to promote ability to walk to car/ ambulate into PT clinic.      Baseline  mod. I for extra time.     Time  4    Period  Weeks    Status  Achieved    Target Date  07/26/17      PT LONG TERM GOAL #4   Title  Pt. able to ambulate 300 feet with use of least assistive device and prosthesis to improve safe functional mobility.      Baseline  pt. ambulates approx. 100 feet with RW and mod./ SBA or SPC and min. A for  safety/ verbal cuing.     Time  4    Period  Weeks    Status  Partially Met    Target Date  08/23/17      PT  LONG TERM GOAL #5   Title  Pt. able to amb. up/down ramp at home with mod. I and use of prosthesis safely.      Baseline  use of RW and SBA for safety    Time  4    Period  Weeks    Status  Partially Met    Target Date  08/23/17      Additional Long Term Goals   Additional Long Term Goals  Yes      PT LONG TERM GOAL #6   Title  Pt. able to amb. community distance with use of SPC on level surfaces safety with no LOB.     Baseline  pt. able to amb. with SPC and min. A for safety.  Pt. fearful of falling.     Time  4    Period  Weeks    Status  New    Target Date  08/23/17            Plan - 08/05/17 0824    Clinical Impression Statement  Pt. demonstrates greater mod. independence with use of SPC while walking in PT clinic/ outside.  Moderate cuing to increase upright posture/ head position during step point gait pattern with SPC.  Pt. more consistent with recip. step pattern, esp. on R during L LE stance phase of gait.      Clinical Presentation  Evolving    Clinical Decision Making  Moderate    Rehab Potential  Good    PT Frequency  2x / week    PT Duration  4 weeks    PT Treatment/Interventions  ADLs/Self Care Home Management;Therapeutic activities;Functional mobility training;Stair training;Gait training;Therapeutic exercise;Balance training;Neuromuscular re-education;Patient/family education;Manual techniques;Scar mobilization;Passive range of motion;Prosthetic Training    PT Next Visit Plan  Gait training to least resistive device/ LE strengthening.      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 BKA stump complication ([place]) 11/21/1599  . 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 S/P BKA (below knee amputation) unilateral, left ([place]) 02/01/2017  . Chronic diastolic heart failure ([place]) 01/21/2017  . Pressure injury of skin 01/18/2017  . 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 Diabetic foot ulcer ([place]) 12/29/2016  . Ataxia 10/21/2016  . History of femoral angiogram 09/06/2016  . Left leg pain 09/06/2016  . Leukocytosis 09/06/2016  . Generalized weakness 09/06/2016  . A-fib ([place]) 08/30/2016  . Pneumonia 05/19/2016  . Hyperlipidemia 04/20/2016  . Back pain 04/19/2016  . Type 2 diabetes mellitus treated with insulin ([place]) 04/19/2016  . Essential hypertension 04/19/2016  . PAD (peripheral artery disease) ([place]) 04/19/2016  . Erectile dysfunction following radical prostatectomy 06/25/2015  . History of prostate cancer 12/23/2014  . Incontinence 12/23/2014   Pura Spice, PT, DPT # [id number] 08/05/2017, 9:21 AM  [facility]  [address] [place], [place], [address] Phone: [phone]   Fax:  [phone]  Name: LEANORD THIBEAU MRN: [id number] Date of Birth: 1935-02-18

## 2017-08-06 NOTE — Therapy (Signed)
[facility] [address] [place], [place], [address] Phone: [phone]   Fax:  [phone]  Physical Therapy Treatment  Patient Details  Name: [name] MRN: [id number] Date of Birth: [birth year] Referring Provider: Dr. Leotis Pain   Encounter Date: 08/02/2017  PT End of Session - 08/05/17 0823    Visit Number  11    Number of Visits  17    Date for PT Re-Evaluation  08/23/17    PT Start Time  0816    PT Stop Time  0918    PT Time Calculation (min)  62 min    Equipment Utilized During Treatment  Gait belt    Activity Tolerance  Patient tolerated treatment well    Behavior During Therapy  [facility] for tasks assessed/performed       Past Medical History:  Diagnosis Date  . Arthritis   . Atrial fibrillation ([place])   . Carcinoma of prostate ([place])   . CHF (congestive heart failure) ([place])   . Chronic kidney disease   . Diabetes mellitus without complication ([place])   . ED (erectile dysfunction)   . Frequent urination   . Hyperlipidemia   . Hypertension   . Moderate mitral insufficiency   . Peripheral vascular disease ([place])   . Pneumonia 04/2016  . Prostate cancer ([place])   . Sleep apnea    OSA--USE C-PAP  . Ulcer of left foot due to type 2 diabetes mellitus ([place])   . Urinary stress incontinence, male     Past Surgical History:  Procedure Laterality Date  . AMPUTATION Left 01/12/2017   Procedure: AMPUTATION BELOW KNEE;  Surgeon: Algernon Huxley, MD;  Location: [facility];  Service: General;  Laterality: Left;  . APLIGRAFT PLACEMENT Left 10/15/2016   Procedure: APLIGRAFT PLACEMENT;  Surgeon: Albertine Patricia, DPM;  Location: [facility];  Service: Podiatry;  Laterality: Left;  necrotic ulcer  . CHOLECYSTECTOMY    . EYE SURGERY Bilateral    Cataract Extraction with IOL  . IRRIGATION AND DEBRIDEMENT FOOT Left 10/15/2016   Procedure: IRRIGATION AND DEBRIDEMENT FOOT-EXCISIONAL DEBRIDEMENT OF SKIN 3RD, 4TH AND 5TH TOES WITH APPLICATION OF  APLIGRAFT;  Surgeon: Albertine Patricia, DPM;  Location: [facility];  Service: Podiatry;  Laterality: Left;  necrotic,gangrene  . IRRIGATION AND DEBRIDEMENT FOOT Left 12/09/2016   Procedure: IRRIGATION AND DEBRIDEMENT FOOT-MUSCLE/FASCIA, RESECTION OF FOURTH AND FIFTH METATARSAL NECROTIC BONE;  Surgeon: Albertine Patricia, DPM;  Location: [facility];  Service: Podiatry;  Laterality: Left;  . LOWER EXTREMITY ANGIOGRAPHY Left 08/16/2016   Procedure: Lower Extremity Angiography;  Surgeon: Algernon Huxley, MD;  Location: [place] [facility];  Service: Cardiovascular;  Laterality: Left;  . LOWER EXTREMITY ANGIOGRAPHY Left 09/01/2016   Procedure: Lower Extremity Angiography;  Surgeon: Algernon Huxley, MD;  Location: [place] [facility];  Service: Cardiovascular;  Laterality: Left;  . LOWER EXTREMITY ANGIOGRAPHY Left 10/14/2016   Procedure: Lower Extremity Angiography;  Surgeon: Algernon Huxley, MD;  Location: [place] [facility];  Service: Cardiovascular;  Laterality: Left;  . LOWER EXTREMITY ANGIOGRAPHY Left 12/20/2016   Procedure: Lower Extremity Angiography;  Surgeon: Algernon Huxley, MD;  Location: [place] [facility];  Service: Cardiovascular;  Laterality: Left;  . LOWER EXTREMITY INTERVENTION  09/01/2016   Procedure: Lower Extremity Intervention;  Surgeon: Algernon Huxley, MD;  Location: [place] [facility];  Service: Cardiovascular;;  . PROSTATE SURGERY     removal  . TONSILLECTOMY     as a child  There were no vitals filed for this visit.  Subjective Assessment - 08/05/17 0823    Subjective  Pt. entered PT with use of SPC and SBA from PT.  Pt. reports prosthesis is not feeling comfortable.  Pt. is considering calling Staci Righter to discuss.       Patient is accompained by:  Family member    Limitations  Lifting;Standing;Walking;House hold activities    Patient Stated Goals  improve walking/ standing tolerance and safety.      Currently in Pain?  Yes    Pain Score  5     Pain Location  Leg    Pain Orientation   Left          There.ex:  Nustep L510 min B UE/LE (warm up/no charge) Step ups L LE 10x2 with 1 UE.  //-bars 1 UE assist high knee marching x8/ lateral walking with hip flexion. 1/2 foam wt. Shifting with B UE assist and mirror feedback Supine hamstring stretches BOSU step ups/ downs/ static standing with light UE assist in //-bars. Reviewed HEP    Gait:  Walk with RWin PT gym progressing to [facility] withreciprocal gait and with min verbal cueing required. Limited confidence/ stance time on L LE with SPC use (no LOB but fearful).  Ambulate in //-bars with R UE only and recip. 2-point gait pattern. Cuing to maintain BOS and upright posture (progressing to [facility] Ascend/ descend 4 steps with recip. Pattern and 1 UE assist safely.  Cuing to prevent posterior leaning during descending stairs.   Amb. In hallway and outside with use of RW progressing to [facility].         PT Long Term Goals - 07/26/17 2036      PT LONG TERM GOAL #1   Title  Pt. able to transfer from w/c to least assistive device with mod. I and use of prosthesis safety.      Baseline  mod. I with use of RW or SPC    Time  4    Period  Weeks    Status  Achieved    Target Date  07/26/17      PT LONG TERM GOAL #2   Title  Pt. will increase FOTO to 49 to improve functional mobility.      Baseline  FOTO on 2/6: 34.  3/4: 49    Time  4    Period  Weeks    Status  Achieved    Target Date  07/26/17      PT LONG TERM GOAL #3   Title  Pt. able to don/ doff L prosthesis with mod. I prior to PT tx. session to promote ability to walk to car/ ambulate into PT clinic.      Baseline  mod. I for extra time.     Time  4    Period  Weeks    Status  Achieved    Target Date  07/26/17      PT LONG TERM GOAL #4   Title  Pt. able to ambulate 300 feet with use of least assistive device and prosthesis to improve safe functional mobility.      Baseline  pt. ambulates approx. 100 feet with RW and mod./ SBA or SPC and min. A for  safety/ verbal cuing.     Time  4    Period  Weeks    Status  Partially Met    Target Date  08/23/17      PT LONG TERM GOAL #  5   Title  Pt. able to amb. up/down ramp at home with mod. I and use of prosthesis safely.      Baseline  use of RW and SBA for safety    Time  4    Period  Weeks    Status  Partially Met    Target Date  08/23/17      Additional Long Term Goals   Additional Long Term Goals  Yes      PT LONG TERM GOAL #6   Title  Pt. able to amb. community distance with use of SPC on level surfaces safety with no LOB.     Baseline  pt. able to amb. with SPC and min. A for safety.  Pt. fearful of falling.     Time  4    Period  Weeks    Status  New    Target Date  08/23/17            Plan - 08/05/17 0824    Clinical Impression Statement  Pt. demonstrates greater mod. independence with use of SPC while walking in PT clinic/ outside.  Moderate cuing to increase upright posture/ head position during step point gait pattern with SPC.  Pt. more consistent with recip. step pattern, esp. on R during L LE stance phase of gait.      Clinical Presentation  Evolving    Clinical Decision Making  Moderate    Rehab Potential  Good    PT Frequency  2x / week    PT Duration  4 weeks    PT Treatment/Interventions  ADLs/Self Care Home Management;Therapeutic activities;Functional mobility training;Stair training;Gait training;Therapeutic exercise;Balance training;Neuromuscular re-education;Patient/family education;Manual techniques;Scar mobilization;Passive range of motion;Prosthetic Training    PT Next Visit Plan  Gait training to least resistive device/ LE strengthening.      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 BKA stump complication ([place]) 91/63/8466  . 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 S/P BKA (below knee amputation) unilateral, left ([place]) 02/01/2017  . Chronic diastolic heart failure ([place]) 01/21/2017  . Pressure injury of skin 01/18/2017  . 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 Diabetic foot ulcer ([place]) 12/29/2016  . Ataxia 10/21/2016  . History of femoral angiogram 09/06/2016  . Left leg pain 09/06/2016  . Leukocytosis 09/06/2016  . Generalized weakness 09/06/2016  . A-fib ([place]) 08/30/2016  . Pneumonia 05/19/2016  . Hyperlipidemia 04/20/2016  . Back pain 04/19/2016  . Type 2 diabetes mellitus treated with insulin ([place]) 04/19/2016  . Essential hypertension 04/19/2016  . PAD (peripheral artery disease) ([place]) 04/19/2016  . Erectile dysfunction following radical prostatectomy 06/25/2015  . History of prostate cancer 12/23/2014  . Incontinence 12/23/2014   Pura Spice, PT, DPT # [phone] 08/06/2017, 8:58 AM  [facility] [address] [place],  [place], [address] Phone: [phone]   Fax:  [phone]  Name: [name] MRN: [id number] Date of Birth: 1934/11/27

## 2017-08-09 ENCOUNTER — Ambulatory Visit: Payer: Medicare Other | Admitting: Physical Therapy

## 2017-08-09 DIAGNOSIS — M6281 Muscle weakness (generalized): Secondary | ICD-10-CM | POA: Diagnosis not present

## 2017-08-09 DIAGNOSIS — R269 Unspecified abnormalities of gait and mobility: Secondary | ICD-10-CM

## 2017-08-09 DIAGNOSIS — S88112A Complete traumatic amputation at level between knee and ankle, left lower leg, initial encounter: Secondary | ICD-10-CM

## 2017-08-11 ENCOUNTER — Ambulatory Visit: Payer: Medicare Other | Admitting: Physical Therapy

## 2017-08-11 ENCOUNTER — Encounter: Payer: Self-pay | Admitting: Physical Therapy

## 2017-08-11 DIAGNOSIS — R269 Unspecified abnormalities of gait and mobility: Secondary | ICD-10-CM

## 2017-08-11 DIAGNOSIS — M6281 Muscle weakness (generalized): Secondary | ICD-10-CM | POA: Diagnosis not present

## 2017-08-11 DIAGNOSIS — S88112A Complete traumatic amputation at level between knee and ankle, left lower leg, initial encounter: Secondary | ICD-10-CM

## 2017-08-14 NOTE — Therapy (Signed)
[name] [address]. [place], [place], [address] Phone: [phone]   Fax:  [phone]  Physical Therapy Treatment  Patient Details  Name: [name] MRN: [id number] Date of Birth: 03/12/1935 Referring Provider: Dr. Leotis Pain   Encounter Date: 08/09/2017  PT End of Session - 08/14/17 1948    Visit Number  12    Number of Visits  17    Date for PT Re-Evaluation  08/23/17    PT Start Time  1028    PT Stop Time  1129    PT Time Calculation (min)  61 min    Activity Tolerance  Patient tolerated treatment well    Behavior During Therapy  [facility] for tasks assessed/performed       Past Medical History:  Diagnosis Date  . Arthritis   . Atrial fibrillation ([place])   . Carcinoma of prostate ([place])   . CHF (congestive heart failure) ([place])   . Chronic kidney disease   . Diabetes mellitus without complication ([place])   . ED (erectile dysfunction)   . Frequent urination   . Hyperlipidemia   . Hypertension   . Moderate mitral insufficiency   . Peripheral vascular disease ([place])   . Pneumonia 04/2016  . Prostate cancer ([place])   . Sleep apnea    OSA--USE C-PAP  . Ulcer of left foot due to type 2 diabetes mellitus ([place])   . Urinary stress incontinence, male     Past Surgical History:  Procedure Laterality Date  . AMPUTATION Left 01/12/2017   Procedure: AMPUTATION BELOW KNEE;  Surgeon: Algernon Huxley, MD;  Location: [facility];  Service: General;  Laterality: Left;  . APLIGRAFT PLACEMENT Left 10/15/2016   Procedure: APLIGRAFT PLACEMENT;  Surgeon: Albertine Patricia, DPM;  Location: [facility];  Service: Podiatry;  Laterality: Left;  necrotic ulcer  . CHOLECYSTECTOMY    . EYE SURGERY Bilateral    Cataract Extraction with IOL  . IRRIGATION AND DEBRIDEMENT FOOT Left 10/15/2016   Procedure: IRRIGATION AND DEBRIDEMENT FOOT-EXCISIONAL DEBRIDEMENT OF SKIN 3RD, 4TH AND 5TH TOES WITH APPLICATION OF APLIGRAFT;  Surgeon: Albertine Patricia, DPM;   Location: [facility];  Service: Podiatry;  Laterality: Left;  necrotic,gangrene  . IRRIGATION AND DEBRIDEMENT FOOT Left 12/09/2016   Procedure: IRRIGATION AND DEBRIDEMENT FOOT-MUSCLE/FASCIA, RESECTION OF FOURTH AND FIFTH METATARSAL NECROTIC BONE;  Surgeon: Albertine Patricia, DPM;  Location: [facility];  Service: Podiatry;  Laterality: Left;  . LOWER EXTREMITY ANGIOGRAPHY Left 08/16/2016   Procedure: Lower Extremity Angiography;  Surgeon: Algernon Huxley, MD;  Location: [place] [facility];  Service: Cardiovascular;  Laterality: Left;  . LOWER EXTREMITY ANGIOGRAPHY Left 09/01/2016   Procedure: Lower Extremity Angiography;  Surgeon: Algernon Huxley, MD;  Location: [place] [facility];  Service: Cardiovascular;  Laterality: Left;  . LOWER EXTREMITY ANGIOGRAPHY Left 10/14/2016   Procedure: Lower Extremity Angiography;  Surgeon: Algernon Huxley, MD;  Location: [place] [facility];  Service: Cardiovascular;  Laterality: Left;  . LOWER EXTREMITY ANGIOGRAPHY Left 12/20/2016   Procedure: Lower Extremity Angiography;  Surgeon: Algernon Huxley, MD;  Location: [place] [facility];  Service: Cardiovascular;  Laterality: Left;  . LOWER EXTREMITY INTERVENTION  09/01/2016   Procedure: Lower Extremity Intervention;  Surgeon: Algernon Huxley, MD;  Location: [place] [facility];  Service: Cardiovascular;;  . PROSTATE SURGERY     removal  . TONSILLECTOMY     as a child    There were no vitals filed for this visit.  Subjective Assessment - 08/14/17 1940    Subjective  Pt. drove to PT with wife in passenger seat.  Pt. entered PT with use of RW.      Patient is accompained by:  Family member    Limitations  Lifting;Standing;Walking;House hold activities    Patient Stated Goals  improve walking/ standing tolerance and safety.      Currently in Pain?  Yes    Pain Score  2     Pain Location  Leg    Pain Orientation  Left        Treatment:  There.ex:  Nustep L610 min B UE/LE (warm up/no charge). Readjusted prosthesis/ gel  liner. Step upsL LE 20x with 1 UE (moderate cuing to extend L knee/ activate quad). Seated LAQ with added L hamstring stretches (static holds as tolerated). //-bars 2 UE assist high knee marching x10  Neuro:  Forward/backward/lateral walking with 1 UE assist and cuing for proper upright posture. Airex walking (step ups/overs/wt. Shifting).   Turning with 1 UE to no UE assist L/R Resisted walking 1BTB with min. To no UE assist 5x all planes.  Moderate cuing to stand upright/ use mirror.     Gait:  Ascend/ descend 4 steps x 4 with use of 1 handrail/ SPC and recip. Pattern.   Amb. In hallway and outside with use of SPC only today (no RW).  Ambulate in grass/ up and down curb/ over to [facility] clinic.  Moderate fatigue and CGA for safety with final 20 feet into [facility] lobby.  No LOB but decrease step pattern, esp. Around a change in thresholds.  Poor head position/ posture.        PT Long Term Goals - 07/26/17 2036      PT LONG TERM GOAL #1   Title  Pt. able to transfer from w/c to least assistive device with mod. I and use of prosthesis safety.      Baseline  mod. I with use of RW or SPC    Time  4    Period  Weeks    Status  Achieved    Target Date  07/26/17      PT LONG TERM GOAL #2   Title  Pt. will increase FOTO to 49 to improve functional mobility.      Baseline  FOTO on 2/6: 34.  3/4: 49    Time  4    Period  Weeks    Status  Achieved    Target Date  07/26/17      PT LONG TERM GOAL #3   Title  Pt. able to don/ doff L prosthesis with mod. I prior to PT tx. session to promote ability to walk to car/ ambulate into PT clinic.      Baseline  mod. I for extra time.     Time  4    Period  Weeks    Status  Achieved    Target Date  07/26/17      PT LONG TERM GOAL #4   Title  Pt. able to ambulate 300 feet with use of least assistive device and prosthesis to improve safe functional mobility.      Baseline  pt. ambulates approx. 100 feet with RW and mod./ SBA or SPC and min. A  for safety/ verbal cuing.     Time  4    Period  Weeks    Status  Partially Met    Target Date  08/23/17  PT LONG TERM GOAL #5   Title  Pt. able to amb. up/down ramp at home with mod. I and use of prosthesis safely.      Baseline  use of RW and SBA for safety    Time  4    Period  Weeks    Status  Partially Met    Target Date  08/23/17      Additional Long Term Goals   Additional Long Term Goals  Yes      PT LONG TERM GOAL #6   Title  Pt. able to amb. community distance with use of SPC on level surfaces safety with no LOB.     Baseline  pt. able to amb. with SPC and min. A for safety.  Pt. fearful of falling.     Time  4    Period  Weeks    Status  New    Target Date  08/23/17            Plan - 08/14/17 1949    Clinical Impression Statement  Pt. ambulates with small step patterns while transitioning to [facility] from [facility].  Pt. has limited confidence with use of SPC and tends to look down at feet t/o gait cycle.  No LOB with use of SPC and pt. encouraged to increase cadence while at PT to focus on a more normalized gait pattern.  Pt. continues to compliain of L prosthesis fit/ discomfort.  PT reassess L residual limb and adjusted gel liner/ ply socks after use of Nustep.      Clinical Presentation  Evolving    Clinical Decision Making  Moderate    Rehab Potential  Good    PT Frequency  2x / week    PT Duration  4 weeks    PT Treatment/Interventions  ADLs/Self Care Home Management;Therapeutic activities;Functional mobility training;Stair training;Gait training;Therapeutic exercise;Balance training;Neuromuscular re-education;Patient/family education;Manual techniques;Scar mobilization;Passive range of motion;Prosthetic Training    PT Next Visit Plan  Gait training to least resistive device/ LE strengthening.      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 BKA stump complication ([place]) 96/28/3662  . 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 S/P BKA (below knee amputation) unilateral, left ([place]) 02/01/2017  . Chronic diastolic heart failure ([place]) 01/21/2017  . Pressure injury of skin 01/18/2017  . 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 Diabetic foot ulcer ([place]) 12/29/2016  . Ataxia 10/21/2016  . History of femoral angiogram 09/06/2016  . Left leg pain 09/06/2016  . Leukocytosis 09/06/2016  . Generalized weakness 09/06/2016  . A-fib ([place]) 08/30/2016  . Pneumonia 05/19/2016  . Hyperlipidemia 04/20/2016  . Back pain 04/19/2016  . Type 2 diabetes mellitus treated with insulin ([place]) 04/19/2016  . Essential hypertension 04/19/2016  . PAD (peripheral artery disease) ([place]) 04/19/2016  . Erectile dysfunction following radical prostatectomy 06/25/2015  .  History of prostate cancer 12/23/2014  . Incontinence 12/23/2014   Pura Spice, PT, DPT # [id number] 08/14/2017, 8:01 PM  [facility] [address] [place], [place], [address] Phone: [phone]   Fax:  [phone]  Name: [name] MRN: [id number] Date of Birth: [birth year]

## 2017-08-15 ENCOUNTER — Ambulatory Visit: Payer: Medicare Other | Admitting: Urology

## 2017-08-16 ENCOUNTER — Ambulatory Visit: Payer: Medicare Other | Admitting: Physical Therapy

## 2017-08-16 ENCOUNTER — Encounter: Payer: Self-pay | Admitting: Physical Therapy

## 2017-08-16 DIAGNOSIS — M6281 Muscle weakness (generalized): Secondary | ICD-10-CM

## 2017-08-16 DIAGNOSIS — R269 Unspecified abnormalities of gait and mobility: Secondary | ICD-10-CM

## 2017-08-16 DIAGNOSIS — S88112A Complete traumatic amputation at level between knee and ankle, left lower leg, initial encounter: Secondary | ICD-10-CM

## 2017-08-16 NOTE — Therapy (Signed)
[facility] [address] [place], [place], [address] Phone: [phone]   Fax:  [phone]  Physical Therapy Treatment  Patient Details  Name: [name] MRN: [id number] Date of Birth: 1934-06-15 Referring Provider: Dr. Leotis Pain   Encounter Date: 08/16/2017  PT End of Session - 08/16/17 1216    Visit Number  14    Number of Visits  17    Date for PT Re-Evaluation  08/23/17    PT Start Time  1025    PT Stop Time  1128    PT Time Calculation (min)  63 min    Activity Tolerance  Patient tolerated treatment well    Behavior During Therapy  [facility] for tasks assessed/performed       Past Medical History:  Diagnosis Date  . Arthritis   . Atrial fibrillation ([place])   . Carcinoma of prostate ([place])   . CHF (congestive heart failure) ([place])   . Chronic kidney disease   . Diabetes mellitus without complication ([place])   . ED (erectile dysfunction)   . Frequent urination   . Hyperlipidemia   . Hypertension   . Moderate mitral insufficiency   . Peripheral vascular disease ([place])   . Pneumonia 04/2016  . Prostate cancer ([place])   . Sleep apnea    OSA--USE C-PAP  . Ulcer of left foot due to type 2 diabetes mellitus ([place])   . Urinary stress incontinence, male     Past Surgical History:  Procedure Laterality Date  . AMPUTATION Left 01/12/2017   Procedure: AMPUTATION BELOW KNEE;  Surgeon: Algernon Huxley, MD;  Location: [facility];  Service: General;  Laterality: Left;  . APLIGRAFT PLACEMENT Left 10/15/2016   Procedure: APLIGRAFT PLACEMENT;  Surgeon: Albertine Patricia, DPM;  Location: [facility];  Service: Podiatry;  Laterality: Left;  necrotic ulcer  . CHOLECYSTECTOMY    . EYE SURGERY Bilateral    Cataract Extraction with IOL  . IRRIGATION AND DEBRIDEMENT FOOT Left 10/15/2016   Procedure: IRRIGATION AND DEBRIDEMENT FOOT-EXCISIONAL DEBRIDEMENT OF SKIN 3RD, 4TH AND 5TH TOES WITH APPLICATION OF APLIGRAFT;  Surgeon: Albertine Patricia, DPM;   Location: [facility];  Service: Podiatry;  Laterality: Left;  necrotic,gangrene  . IRRIGATION AND DEBRIDEMENT FOOT Left 12/09/2016   Procedure: IRRIGATION AND DEBRIDEMENT FOOT-MUSCLE/FASCIA, RESECTION OF FOURTH AND FIFTH METATARSAL NECROTIC BONE;  Surgeon: Albertine Patricia, DPM;  Location: [facility];  Service: Podiatry;  Laterality: Left;  . LOWER EXTREMITY ANGIOGRAPHY Left 08/16/2016   Procedure: Lower Extremity Angiography;  Surgeon: Algernon Huxley, MD;  Location: [place] [facility];  Service: Cardiovascular;  Laterality: Left;  . LOWER EXTREMITY ANGIOGRAPHY Left 09/01/2016   Procedure: Lower Extremity Angiography;  Surgeon: Algernon Huxley, MD;  Location: [place] [facility];  Service: Cardiovascular;  Laterality: Left;  . LOWER EXTREMITY ANGIOGRAPHY Left 10/14/2016   Procedure: Lower Extremity Angiography;  Surgeon: Algernon Huxley, MD;  Location: [place] [facility];  Service: Cardiovascular;  Laterality: Left;  . LOWER EXTREMITY ANGIOGRAPHY Left 12/20/2016   Procedure: Lower Extremity Angiography;  Surgeon: Algernon Huxley, MD;  Location: [place] [facility];  Service: Cardiovascular;  Laterality: Left;  . LOWER EXTREMITY INTERVENTION  09/01/2016   Procedure: Lower Extremity Intervention;  Surgeon: Algernon Huxley, MD;  Location: [place] [facility];  Service: Cardiovascular;;  . PROSTATE SURGERY     removal  . TONSILLECTOMY     as a child    There were no vitals filed for this visit.  Subjective Assessment - 08/16/17 1212    Subjective  Pt. reports no new complaints.  L lateral residual limb discomfort remains present.  Pt. will talk with Staci Righter this Friday??    Patient is accompained by:  Family member    Limitations  Lifting;Standing;Walking;House hold activities    Patient Stated Goals  improve walking/ standing tolerance and safety.      Currently in Pain?  Yes    Pain Score  3     Pain Location  Leg    Pain Orientation  Left;Lateral    Pain Descriptors / Indicators  Aching        Treatment:  There.ex:  Scifit L610 min B UE/LE (warm up/no charge). Standing 12" step touches L/R 20x (varying UE assist to no assist).  Standing hip flexion/ abd./ extension/ knee flexion 20x each (min. To no UE assist).   Step upsL LE20xwith 1 UE (moderate cuing toextend L knee/ activate quad). Seated LAQ/ marching 20x each. Tandem walking forward in //-bars 2x (difficulty/ CGA for safety).  Gait:  Ascend/ descend 4 steps x 5 with use of 1 handrail/ SPC and recip. Pattern. Amb. In hallway working on consistent step pattern, esp. R swing through phase of gait.  Discussed yard work/ using weed eater. Amb. In //-bars with no UE assist working on posture/ consistent BOS and step pattern.     PT Long Term Goals - 07/26/17 2036      PT LONG TERM GOAL #1   Title  Pt. able to transfer from w/c to least assistive device with mod. I and use of prosthesis safety.      Baseline  mod. I with use of RW or SPC    Time  4    Period  Weeks    Status  Achieved    Target Date  07/26/17      PT LONG TERM GOAL #2   Title  Pt. will increase FOTO to 49 to improve functional mobility.      Baseline  FOTO on 2/6: 34.  3/4: 49    Time  4    Period  Weeks    Status  Achieved    Target Date  07/26/17      PT LONG TERM GOAL #3   Title  Pt. able to don/ doff L prosthesis with mod. I prior to PT tx. session to promote ability to walk to car/ ambulate into PT clinic.      Baseline  mod. I for extra time.     Time  4    Period  Weeks    Status  Achieved    Target Date  07/26/17      PT LONG TERM GOAL #4   Title  Pt. able to ambulate 300 feet with use of least assistive device and prosthesis to improve safe functional mobility.      Baseline  pt. ambulates approx. 100 feet with RW and mod./ SBA or SPC and min. A for safety/ verbal cuing.     Time  4    Period  Weeks    Status  Partially Met    Target Date  08/23/17      PT LONG TERM GOAL #5   Title  Pt. able to amb.  up/down ramp at home with mod. I and use of prosthesis safely.      Baseline  use of RW and SBA for safety    Time  4    Period  Weeks    Status  Partially Met    Target Date  08/23/17      Additional Long Term Goals   Additional Long Term Goals  Yes      PT LONG TERM GOAL #6   Title  Pt. able to amb. community distance with use of SPC on level surfaces safety with no LOB.     Baseline  pt. able to amb. with SPC and min. A for safety.  Pt. fearful of falling.     Time  4    Period  Weeks    Status  New    Target Date  08/23/17         Plan - 08/16/17 1217    Clinical Impression Statement  Pt. demonstrates ability to ambulate 50 feet in clinic with no assistive device and consistent step pattern.  Pt. demonstrates mod. independence with use of SPC in clinic and outside walking to car safely.  No LOB during tx. session.  Pt. has appt. with Staci Righter this Friday to discuss fit of L prosthesis.      Clinical Presentation  Evolving    Clinical Decision Making  Moderate    Rehab Potential  Good    PT Frequency  2x / week    PT Duration  4 weeks    PT Treatment/Interventions  ADLs/Self Care Home Management;Therapeutic activities;Functional mobility training;Stair training;Gait training;Therapeutic exercise;Balance training;Neuromuscular re-education;Patient/family education;Manual techniques;Scar mobilization;Passive range of motion;Prosthetic Training    PT Next Visit Plan  Gait training to least resistive device/ LE strengthening.  CHECK TX SCHEDULE.      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 BKA stump complication ([place]) 66/29/4765  . 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 S/P BKA (below knee amputation) unilateral, left ([place]) 02/01/2017  . Chronic diastolic heart failure ([place]) 01/21/2017  . Pressure injury of skin 01/18/2017  . 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 Diabetic foot ulcer ([place]) 12/29/2016  . Ataxia 10/21/2016  . History of femoral angiogram 09/06/2016  . Left leg pain 09/06/2016  . Leukocytosis 09/06/2016  . Generalized weakness 09/06/2016  . A-fib ([place]) 08/30/2016  . Pneumonia 05/19/2016  . Hyperlipidemia 04/20/2016  . Back pain 04/19/2016  . Type 2 diabetes mellitus treated with insulin ([place]) 04/19/2016  . Essential hypertension 04/19/2016  . PAD (peripheral artery disease) ([place]) 04/19/2016  . Erectile dysfunction following radical prostatectomy 06/25/2015  . History of prostate cancer 12/23/2014  . Incontinence 12/23/2014   Pura Spice, PT, DPT # [phone] 08/16/2017, 12:28 PM  [facility] [address] [place], [place], [address] Phone: [phone]   Fax:  [phone]  Name: ARLENE GENOVA MRN: [id number] Date of Birth: 03-29-1935

## 2017-08-16 NOTE — Therapy (Signed)
[facility] [address] [place], [place], [address] Phone: [phone]   Fax:  [phone]  Physical Therapy Treatment  Patient Details  Name: [name] MRN: [id number] Date of Birth: 21-Jul-1934 Referring Provider: Dr. Leotis Pain   Encounter Date: 08/11/2017  PT End of Session - 08/16/17 1143    Visit Number  13    Number of Visits  17    Date for PT Re-Evaluation  08/23/17    PT Start Time  1026    PT Stop Time  1122    PT Time Calculation (min)  56 min    Equipment Utilized During Treatment  Gait belt    Activity Tolerance  Patient tolerated treatment well    Behavior During Therapy  [facility] for tasks assessed/performed       Past Medical History:  Diagnosis Date  . Arthritis   . Atrial fibrillation ([place])   . Carcinoma of prostate ([place])   . CHF (congestive heart failure) ([place])   . Chronic kidney disease   . Diabetes mellitus without complication ([place])   . ED (erectile dysfunction)   . Frequent urination   . Hyperlipidemia   . Hypertension   . Moderate mitral insufficiency   . Peripheral vascular disease ([place])   . Pneumonia 04/2016  . Prostate cancer ([place])   . Sleep apnea    OSA--USE C-PAP  . Ulcer of left foot due to type 2 diabetes mellitus ([place])   . Urinary stress incontinence, male     Past Surgical History:  Procedure Laterality Date  . AMPUTATION Left 01/12/2017   Procedure: AMPUTATION BELOW KNEE;  Surgeon: Algernon Huxley, MD;  Location: [facility] OR[facility];  Service: General;  Laterality: Left;  . APLIGRAFT PLACEMENT Left 10/15/2016   Procedure: APLIGRAFT PLACEMENT;  Surgeon: Albertine Patricia, DPM;  Location: [facility];  Service: Podiatry;  Laterality: Left;  necrotic ulcer  . CHOLECYSTECTOMY    . EYE SURGERY Bilateral    Cataract Extraction with IOL  . IRRIGATION AND DEBRIDEMENT FOOT Left 10/15/2016   Procedure: IRRIGATION AND DEBRIDEMENT FOOT-EXCISIONAL DEBRIDEMENT OF SKIN 3RD, 4TH AND 5TH TOES WITH APPLICATION OF  APLIGRAFT;  Surgeon: Albertine Patricia, DPM;  Location: [facility];  Service: Podiatry;  Laterality: Left;  necrotic,gangrene  . IRRIGATION AND DEBRIDEMENT FOOT Left 12/09/2016   Procedure: IRRIGATION AND DEBRIDEMENT FOOT-MUSCLE/FASCIA, RESECTION OF FOURTH AND FIFTH METATARSAL NECROTIC BONE;  Surgeon: Albertine Patricia, DPM;  Location: [facility];  Service: Podiatry;  Laterality: Left;  . LOWER EXTREMITY ANGIOGRAPHY Left 08/16/2016   Procedure: Lower Extremity Angiography;  Surgeon: Algernon Huxley, MD;  Location: [place] [facility];  Service: Cardiovascular;  Laterality: Left;  . LOWER EXTREMITY ANGIOGRAPHY Left 09/01/2016   Procedure: Lower Extremity Angiography;  Surgeon: Algernon Huxley, MD;  Location: [place] [facility];  Service: Cardiovascular;  Laterality: Left;  . LOWER EXTREMITY ANGIOGRAPHY Left 10/14/2016   Procedure: Lower Extremity Angiography;  Surgeon: Algernon Huxley, MD;  Location: [place] [facility];  Service: Cardiovascular;  Laterality: Left;  . LOWER EXTREMITY ANGIOGRAPHY Left 12/20/2016   Procedure: Lower Extremity Angiography;  Surgeon: Algernon Huxley, MD;  Location: [place] [facility];  Service: Cardiovascular;  Laterality: Left;  . LOWER EXTREMITY INTERVENTION  09/01/2016   Procedure: Lower Extremity Intervention;  Surgeon: Algernon Huxley, MD;  Location: [place] [facility];  Service: Cardiovascular;;  . PROSTATE SURGERY     removal  . TONSILLECTOMY     as a child  There were no vitals filed for this visit.  Subjective Assessment - 08/16/17 1142    Subjective  Pt. reports no new complaints.  L lateral residual limb discomfort remains present.  Pt. will talk with Staci Righter soon.      Patient is accompained by:  Family member    Limitations  Lifting;Standing;Walking;House hold activities    Patient Stated Goals  improve walking/ standing tolerance and safety.      Currently in Pain?  Yes    Pain Score  2     Pain Location  Leg    Pain Orientation  Left;Lateral    Pain Descriptors  / Indicators  Aching       Treatment:  There.ex:  Nustep L610 min B UE/LE (warm up/no charge).  Standing hip flexion/ abd./ extension/ knee flexion 20x each (1 UE assist).   Step upsL LE20xwith 1 UE (moderate cuing to extend L knee/ activate quad). Seated LAQ with added L hamstring stretches (static holds as tolerated)/ seated marching/ hip abd. With manual resistance 10x each.  Gait:  Ascend/ descend 4 steps x 5 with use of 1 handrail/ SPC and recip. Pattern.   Amb. In hallway and outside with use [place] only.  Working on consistent step pattern, esp. R swing through phase of gait.  Up/down ramp and curbs with use of SPC only and CGA for safety.  Pt. Instructed to purchase [facility] for use at home/ outside.   Discussed blood glucose levels.       PT Long Term Goals - 07/26/17 2036      PT LONG TERM GOAL #1   Title  Pt. able to transfer from w/c to least assistive device with mod. I and use of prosthesis safety.      Baseline  mod. I with use of RW or SPC    Time  4    Period  Weeks    Status  Achieved    Target Date  07/26/17      PT LONG TERM GOAL #2   Title  Pt. will increase FOTO to 49 to improve functional mobility.      Baseline  FOTO on 2/6: 34.  3/4: 49    Time  4    Period  Weeks    Status  Achieved    Target Date  07/26/17      PT LONG TERM GOAL #3   Title  Pt. able to don/ doff L prosthesis with mod. I prior to PT tx. session to promote ability to walk to car/ ambulate into PT clinic.      Baseline  mod. I for extra time.     Time  4    Period  Weeks    Status  Achieved    Target Date  07/26/17      PT LONG TERM GOAL #4   Title  Pt. able to ambulate 300 feet with use of least assistive device and prosthesis to improve safe functional mobility.      Baseline  pt. ambulates approx. 100 feet with RW and mod./ SBA or SPC and min. A for safety/ verbal cuing.     Time  4    Period  Weeks    Status  Partially Met    Target Date  08/23/17      PT  LONG TERM GOAL #5   Title  Pt. able to amb. up/down ramp at home with mod. I and use of prosthesis safely.  Baseline  use of RW and SBA for safety    Time  4    Period  Weeks    Status  Partially Met    Target Date  08/23/17      Additional Long Term Goals   Additional Long Term Goals  Yes      PT LONG TERM GOAL #6   Title  Pt. able to amb. community distance with use of SPC on level surfaces safety with no LOB.     Baseline  pt. able to amb. with SPC and min. A for safety.  Pt. fearful of falling.     Time  4    Period  Weeks    Status  New    Target Date  08/23/17         Plan - 08/16/17 1145    Clinical Impression Statement  Progressing with more consistent step pattern, esp. R LE step through length/ heel strike to toe off.  PT focusing on initial steps after sitting to increase wt. shifting/ posture/ BOS.  Good L quad strengthening/ knee extension in seated position.  Several short seated rest breaks t/o tx. session.      Clinical Presentation  Evolving    Clinical Decision Making  Moderate    Rehab Potential  Good    PT Frequency  2x / week    PT Duration  4 weeks    PT Treatment/Interventions  ADLs/Self Care Home Management;Therapeutic activities;Functional mobility training;Stair training;Gait training;Therapeutic exercise;Balance training;Neuromuscular re-education;Patient/family education;Manual techniques;Scar mobilization;Passive range of motion;Prosthetic Training    PT Next Visit Plan  Gait training to least resistive device/ LE strengthening.      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 BKA stump complication ([place]) 36/62/9476  . 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 S/P BKA (below knee amputation) unilateral, left ([place]) 02/01/2017  . Chronic diastolic heart failure ([place]) 01/21/2017  . Pressure injury of skin 01/18/2017  . 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 Diabetic foot ulcer ([place]) 12/29/2016  . Ataxia 10/21/2016  . History of femoral angiogram 09/06/2016  . Left leg pain 09/06/2016  . Leukocytosis 09/06/2016  . Generalized weakness 09/06/2016  . A-fib ([place]) 08/30/2016  . Pneumonia 05/19/2016  . Hyperlipidemia 04/20/2016  . Back pain 04/19/2016  . Type 2 diabetes mellitus treated with insulin ([place]) 04/19/2016  . Essential hypertension 04/19/2016  . PAD (peripheral artery disease) ([place]) 04/19/2016  . Erectile dysfunction following radical prostatectomy 06/25/2015  . History of prostate cancer 12/23/2014  . Incontinence 12/23/2014   Pura Spice, PT, DPT # [phone] 08/16/2017, 11:56 AM  [facility] [address] [place], [place], [address] Phone: [phone]   Fax:  [phone]  Name: [name] MRN: [id number] Date of Birth: 1934/07/10

## 2017-08-18 ENCOUNTER — Ambulatory Visit: Payer: Medicare Other | Admitting: Physical Therapy

## 2017-08-18 DIAGNOSIS — M6281 Muscle weakness (generalized): Secondary | ICD-10-CM | POA: Diagnosis not present

## 2017-08-18 DIAGNOSIS — R269 Unspecified abnormalities of gait and mobility: Secondary | ICD-10-CM

## 2017-08-18 DIAGNOSIS — S88112A Complete traumatic amputation at level between knee and ankle, left lower leg, initial encounter: Secondary | ICD-10-CM

## 2017-08-18 DIAGNOSIS — R569 Unspecified convulsions: Secondary | ICD-10-CM | POA: Insufficient documentation

## 2017-08-18 NOTE — Progress Notes (Signed)
3:36 PM   DENIM KALMBACH 13-Mar-1935 [id number]  Referring provider: Alanson Aly, [place] [address] [place], [place] [address]  Chief Complaint  Patient presents with  . Urinary Incontinence    HPI: Patient is an 83 year old Caucasian male with a history of prostate cancer who presents today for the evaluation of incontinence.    He states he is having incontinence episodes up to ten times a day.  He is going through 4 to 5 depends daily and 1 to 3 depends at night.  He has been dealing with incontinence for since his RRP 6 years ago.  It has worsened since his below the knee amputation for PAD and DM causing gangrene and ulcers.  Patient denies any gross hematuria, dysuria or suprapubic/flank pain.  Patient denies any fevers, chills, nausea or vomiting.   His PVR is 46 mL.    He has had a trial of anticholinergics for the incontinence in the past, but it was ineffective.    He has a history of Gleason's 6 adenocarcinoma of the prostate who underwent a radical prostatectomy approximately 6 years by Dr. Rosana Hoes in [place].  His PSA's have been undetectable since that time.  His iPSA was 7.4 ng/mL.   His last PSA was <0.1 ng/mL on 06/25/2015.    I PSS is 27/5.  His previous IPSS was 5/1.   IPSS    Row Name 08/19/17 1100         International Prostate Symptom Score   How often have you had the sensation of not emptying your bladder?  Almost always     How often have you had to urinate less than every two hours?  Almost always     How often have you found you stopped and started again several times when you urinated?  Almost always     How often have you found it difficult to postpone urination?  Almost always     How often have you had a weak urinary stream?  Almost always     How often have you had to strain to start urination?  Less than half the time     How many times did you typically get up at night to urinate?  None     Total IPSS Score  27         Quality of Life due to urinary symptoms   If you were to spend the rest of your life with your urinary condition just the way it is now how would you feel about that?  Unhappy        Score:  1-7 Mild 8-19 Moderate 20-35 Severe   PMH: Past Medical History:  Diagnosis Date  . Arthritis   . Atrial fibrillation ([place])   . Carcinoma of prostate ([place])   . CHF (congestive heart failure) ([place])   . Chronic kidney disease   . Diabetes mellitus without complication ([place])   . ED (erectile dysfunction)   . Frequent urination   . Hyperlipidemia   . Hypertension   . Moderate mitral insufficiency   . Peripheral vascular disease ([place])   . Pneumonia 04/2016  . Prostate cancer ([place])   . Sleep apnea    OSA--USE C-PAP  . Ulcer of left foot due to type 2 diabetes mellitus ([place])   . Urinary stress incontinence, male     Surgical History: Past Surgical History:  Procedure Laterality Date  . AMPUTATION Left 01/12/2017   Procedure: AMPUTATION  BELOW KNEE;  Surgeon: Algernon Huxley, MD;  Location: [facility];  Service: General;  Laterality: Left;  . APLIGRAFT PLACEMENT Left 10/15/2016   Procedure: APLIGRAFT PLACEMENT;  Surgeon: Albertine Patricia, DPM;  Location: [facility];  Service: Podiatry;  Laterality: Left;  necrotic ulcer  . CHOLECYSTECTOMY    . EYE SURGERY Bilateral    Cataract Extraction with IOL  . IRRIGATION AND DEBRIDEMENT FOOT Left 10/15/2016   Procedure: IRRIGATION AND DEBRIDEMENT FOOT-EXCISIONAL DEBRIDEMENT OF SKIN 3RD, 4TH AND 5TH TOES WITH APPLICATION OF APLIGRAFT;  Surgeon: Albertine Patricia, DPM;  Location: [facility];  Service: Podiatry;  Laterality: Left;  necrotic,gangrene  . IRRIGATION AND DEBRIDEMENT FOOT Left 12/09/2016   Procedure: IRRIGATION AND DEBRIDEMENT FOOT-MUSCLE/FASCIA, RESECTION OF FOURTH AND FIFTH METATARSAL NECROTIC BONE;  Surgeon: Albertine Patricia, DPM;  Location: [facility];  Service: Podiatry;  Laterality: Left;  . LOWER EXTREMITY ANGIOGRAPHY Left 08/16/2016    Procedure: Lower Extremity Angiography;  Surgeon: Algernon Huxley, MD;  Location: [place] [facility];  Service: Cardiovascular;  Laterality: Left;  . LOWER EXTREMITY ANGIOGRAPHY Left 09/01/2016   Procedure: Lower Extremity Angiography;  Surgeon: Algernon Huxley, MD;  Location: [place] [facility];  Service: Cardiovascular;  Laterality: Left;  . LOWER EXTREMITY ANGIOGRAPHY Left 10/14/2016   Procedure: Lower Extremity Angiography;  Surgeon: Algernon Huxley, MD;  Location: [place] [facility];  Service: Cardiovascular;  Laterality: Left;  . LOWER EXTREMITY ANGIOGRAPHY Left 12/20/2016   Procedure: Lower Extremity Angiography;  Surgeon: Algernon Huxley, MD;  Location: [place] [facility];  Service: Cardiovascular;  Laterality: Left;  . LOWER EXTREMITY INTERVENTION  09/01/2016   Procedure: Lower Extremity Intervention;  Surgeon: Algernon Huxley, MD;  Location: [place] [facility];  Service: Cardiovascular;;  . PROSTATE SURGERY     removal  . TONSILLECTOMY     as a child    Home Medications:  Allergies as of 08/19/2017      Reactions   Contrast Media [iodinated Diagnostic Agents] Shortness Of Breath   SOB   Iohexol Shortness Of Breath    Desc: Respiratory Distress, Laryngedema, diaphoresis, Onset Date: [id number]   Metrizamide Shortness Of Breath   SOB SOB SOB   Latex Rash      Medication List        Accurate as of 08/19/17 11:59 PM. Always use your most recent med list.          AMITIZA 24 MCG capsule Generic drug:  lubiprostone Take by mouth.   atorvastatin 80 MG tablet Commonly known as:  LIPITOR Take 80 mg by mouth daily at 6 PM.   digoxin 0.125 MG tablet Commonly known as:  LANOXIN Take 1 tablet (0.125 mg total) by mouth daily.   diltiazem 300 MG 24 hr capsule Commonly known as:  CARDIZEM CD Take 300 mg by mouth daily.   furosemide 20 MG tablet Commonly known as:  LASIX Take 1 tablet (20 mg total) by mouth daily.   insulin aspart 100 UNIT/ML injection Commonly known as:   novoLOG Inject 0-5 Units into the skin 3 (three) times daily with meals.   insulin glargine 100 UNIT/ML injection Commonly known as:  LANTUS Inject 0.1 mLs (10 Units total) into the skin at bedtime.   JARDIANCE 10 MG Tabs tablet Generic drug:  empagliflozin   lamoTRIgine 25 MG tablet Commonly known as:  LAMICTAL Take by mouth.   metFORMIN 1000 MG tablet Commonly known as:  GLUCOPHAGE TAKE 1 TABLET TWICE A DAY WITH MEALS  mirabegron ER 25 MG Tb24 tablet Commonly known as:  MYRBETRIQ Take 1 tablet (25 mg total) by mouth daily.   oxyCODONE 5 MG immediate release tablet Commonly known as:  Oxy IR/ROXICODONE Take 1 tablet (5 mg total) by mouth every 6 (six) hours as needed for moderate pain.   oxyCODONE 10 mg 12 hr tablet Commonly known as:  OXYCONTIN Take 1 tablet (10 mg total) by mouth every 12 (twelve) hours.   potassium chloride SA 20 MEQ tablet Commonly known as:  K-DUR,KLOR-CON Take by mouth.   silver sulfADIAZINE 1 % cream Commonly known as:  SILVADENE Apply topically daily.   TOPROL XL 100 MG 24 hr tablet Generic drug:  metoprolol succinate Take 100 mg by mouth daily.   TRULICITY 3.15 VV/6.1YW Sopn Generic drug:  Dulaglutide 0.75 mg once a week.   XARELTO 15 MG Tabs tablet Generic drug:  Rivaroxaban Take by mouth.       Allergies:  Allergies  Allergen Reactions  . Contrast Media [Iodinated Diagnostic Agents] Shortness Of Breath    SOB  . Iohexol Shortness Of Breath     Desc: Respiratory Distress, Laryngedema, diaphoresis, Onset Date: 73710626   . Metrizamide Shortness Of Breath    SOB SOB SOB   . Latex Rash    Family History: Family History  Problem Relation Age of Onset  . Hypertension Mother   . Diabetes Mother   . Prostate cancer Neg Hx   . Bladder Cancer Neg Hx   . Kidney disease Neg Hx     Social History:  reports that he quit smoking about 22 years ago. His smoking use included cigarettes. He smoked 0.25 packs per day. He has  never used smokeless tobacco. He reports that he does not drink alcohol or use drugs.  ROS: UROLOGY Frequent Urination?: Yes Hard to postpone urination?: No Burning/pain with urination?: No Get up at night to urinate?: Yes Leakage of urine?: Yes Urine stream starts and stops?: No Trouble starting stream?: No Do you have to strain to urinate?: No Blood in urine?: No Urinary tract infection?: No Sexually transmitted disease?: No Injury to kidneys or bladder?: No Painful intercourse?: No Weak stream?: No Erection problems?: No Penile pain?: No  Gastrointestinal Nausea?: No Vomiting?: No Indigestion/heartburn?: No Diarrhea?: No Constipation?: No  Constitutional Fever: No Night sweats?: No Weight loss?: No Fatigue?: No  Skin Skin rash/lesions?: No Itching?: No  Eyes Blurred vision?: No Double vision?: No  Ears/Nose/Throat Sore throat?: No Sinus problems?: No  Hematologic/Lymphatic Swollen glands?: No Easy bruising?: No  Cardiovascular Leg swelling?: No Chest pain?: No  Respiratory Cough?: No Shortness of breath?: No  Endocrine Excessive thirst?: Yes  Musculoskeletal Back pain?: No Joint pain?: No  Neurological Headaches?: No Dizziness?: No  Psychologic Depression?: No Anxiety?: No  Physical Exam: BP 136/86   Pulse 81   Ht 5\' 7"  (1.702 m)   Wt 180 lb (81.6 kg)   BMI 28.19 kg/m   Constitutional: Well nourished. Alert and oriented, No acute distress. HEENT:  AT, moist mucus membranes. Trachea midline, no masses. Cardiovascular: No clubbing, cyanosis, or edema. Respiratory: Normal respiratory effort, no increased work of breathing. GI: Abdomen is soft, non tender, non distended, no abdominal masses. Liver and spleen not palpable.  No hernias appreciated.  Stool sample for occult testing is not indicated.   GU: No CVA tenderness.  No bladder fullness or masses.   Skin: No rashes, bruises or suspicious lesions. Lymph: No cervical or  inguinal adenopathy. Neurologic: Grossly intact,  no focal deficits, moving all 3 extremities.  Left leg with prothesis.   Psychiatric: Normal mood and affect.  Laboratory Data:  Results for orders placed or performed in visit on 12/23/14  PSA  Result Value Ref Range   Prostate Specific Ag, Serum <0.1 0.0 - 4.0 ng/mL    Lab Results  Component Value Date   WBC 12.0 (H) 02/12/2017   HGB 11.8 (L) 02/12/2017   HCT 34.7 (L) 02/12/2017   MCV 82.6 02/12/2017   PLT 296 02/12/2017    Lab Results  Component Value Date   CREATININE 0.69 02/12/2017    Assessment & Plan:    1. History of prostate cancer:   Patient underwent a radical prostatectomy proximately 5 years ago by Dr. Rosana Hoes in [place]. His Gleason score was 6 at the time of his biopsy.  His PSA's have been undetectable since that time.  His last PSA was <0.1 ng/mL on 06/25/2015.  PSA drawn today  2. Incontinence:   Patient has experienced worsening incontinence since his below the knee amputation.  He has tried anticholinergics in the past without success. He wears depends daily.  We will give a trial of Myrbetriq 50 mg daily, I have advised the patient of the side effects of Myrbetriq, such as: elevation in BP, urinary retention and/or HA. RTC in 3 weeks for I PSS and PVR    Return in about 3 weeks (around 09/09/2017) for IPSS and PVR.  Zara Council, PA-C  [facility] [address] [place], [place] [address] [phone]

## 2017-08-19 ENCOUNTER — Ambulatory Visit (INDEPENDENT_AMBULATORY_CARE_PROVIDER_SITE_OTHER): Payer: Medicare Other | Admitting: Urology

## 2017-08-19 VITALS — BP 136/86 | HR 81 | Ht 67.0 in | Wt 180.0 lb

## 2017-08-19 DIAGNOSIS — N3941 Urge incontinence: Secondary | ICD-10-CM

## 2017-08-19 DIAGNOSIS — C61 Malignant neoplasm of prostate: Secondary | ICD-10-CM

## 2017-08-19 DIAGNOSIS — Z8546 Personal history of malignant neoplasm of prostate: Secondary | ICD-10-CM

## 2017-08-19 LAB — BLADDER SCAN AMB NON-IMAGING: SCAN RESULT: 46

## 2017-08-19 MED ORDER — MIRABEGRON ER 25 MG PO TB24: 25.0000 mg | ORAL_TABLET | Freq: Every day | ORAL | 0 refills | Status: DC

## 2017-08-19 NOTE — Therapy (Addendum)
[facility] [address]. [place], [place], [address] Phone: [phone]   Fax:  [phone]  Physical Therapy Treatment  Patient Details  Name: [name] MRN: [id number] Date of Birth: 05-28-1934 Referring Provider: Dr. Leotis Pain   Encounter Date: 08/18/2017  PT End of Session - 08/23/17 1916    Visit Number  15    Number of Visits  17    Date for PT Re-Evaluation  08/23/17    PT Start Time  1021    PT Stop Time  1117    PT Time Calculation (min)  56 min    Activity Tolerance  Patient tolerated treatment well    Behavior During Therapy  [facility] for tasks assessed/performed       Past Medical History:  Diagnosis Date  . Arthritis   . Atrial fibrillation ([place])   . Carcinoma of prostate ([place])   . CHF (congestive heart failure) ([place])   . Chronic kidney disease   . Diabetes mellitus without complication ([place])   . ED (erectile dysfunction)   . Frequent urination   . Hyperlipidemia   . Hypertension   . Moderate mitral insufficiency   . Peripheral vascular disease ([place])   . Pneumonia 04/2016  . Prostate cancer ([place])   . Sleep apnea    OSA--USE C-PAP  . Ulcer of left foot due to type 2 diabetes mellitus ([place])   . Urinary stress incontinence, male     Past Surgical History:  Procedure Laterality Date  . AMPUTATION Left 01/12/2017   Procedure: AMPUTATION BELOW KNEE;  Surgeon: Algernon Huxley, MD;  Location: [facility] OR[facility];  Service: General;  Laterality: Left;  . APLIGRAFT PLACEMENT Left 10/15/2016   Procedure: APLIGRAFT PLACEMENT;  Surgeon: Albertine Patricia, DPM;  Location: [facility];  Service: Podiatry;  Laterality: Left;  necrotic ulcer  . CHOLECYSTECTOMY    . EYE SURGERY Bilateral    Cataract Extraction with IOL  . IRRIGATION AND DEBRIDEMENT FOOT Left 10/15/2016   Procedure: IRRIGATION AND DEBRIDEMENT FOOT-EXCISIONAL DEBRIDEMENT OF SKIN 3RD, 4TH AND 5TH TOES WITH APPLICATION OF APLIGRAFT;  Surgeon: Albertine Patricia, DPM;   Location: [facility];  Service: Podiatry;  Laterality: Left;  necrotic,gangrene  . IRRIGATION AND DEBRIDEMENT FOOT Left 12/09/2016   Procedure: IRRIGATION AND DEBRIDEMENT FOOT-MUSCLE/FASCIA, RESECTION OF FOURTH AND FIFTH METATARSAL NECROTIC BONE;  Surgeon: Albertine Patricia, DPM;  Location: [facility];  Service: Podiatry;  Laterality: Left;  . LOWER EXTREMITY ANGIOGRAPHY Left 08/16/2016   Procedure: Lower Extremity Angiography;  Surgeon: Algernon Huxley, MD;  Location: [place] [facility];  Service: Cardiovascular;  Laterality: Left;  . LOWER EXTREMITY ANGIOGRAPHY Left 09/01/2016   Procedure: Lower Extremity Angiography;  Surgeon: Algernon Huxley, MD;  Location: [place] [facility];  Service: Cardiovascular;  Laterality: Left;  . LOWER EXTREMITY ANGIOGRAPHY Left 10/14/2016   Procedure: Lower Extremity Angiography;  Surgeon: Algernon Huxley, MD;  Location: [place] [facility];  Service: Cardiovascular;  Laterality: Left;  . LOWER EXTREMITY ANGIOGRAPHY Left 12/20/2016   Procedure: Lower Extremity Angiography;  Surgeon: Algernon Huxley, MD;  Location: [place] [facility];  Service: Cardiovascular;  Laterality: Left;  . LOWER EXTREMITY INTERVENTION  09/01/2016   Procedure: Lower Extremity Intervention;  Surgeon: Algernon Huxley, MD;  Location: [place] [facility];  Service: Cardiovascular;;  . PROSTATE SURGERY     removal  . TONSILLECTOMY     as a child      Pt. states he has appt. with  Staci Righter tomorrow to discuss fit of prosthesis. PT met pt. at car to assist with use of SPC while walking into PT clinic.     Treatment:  There.ex:  Scifit L610 min B UE/LE (warm up/no charge). Standing 12" step touches L/R 20x (varying UE assist to no assist). Standing hip flexion/ abd./ extension/ knee flexion 20x each (min. To no UE assist). Step upsL LE20xwith 1 UE (moderate cuing toextend L knee/ activate quad). Seated LAQ/ marching 20x each. Tandem walking forward in //-bars 2x (difficulty/ CGA for  safety).  Gait:  Ascend/ descend 4 steps x5with use of 1 handrail/ SPC and recip. Pattern. Amb. In hallway working on consistent step pattern, esp. R swing through phase of gait.  Amb. In //-bars with no UE assist working on posture/ consistent BOS and step pattern.  Walking short distances in gym with no UE assist working on maintaining BOS/ gait pattern.        Pt. showing good progress with mod. independence with gait/ use of SPC in PT clinic. Pt. requires mod. cuing to correct upright posture/ head position during standing ther.ex./ gait in clinic. Pt. has consistent recip. step pattern and proper BOS during gait and stair training. No increase c/o residual limb pain reported at end of tx. session.   PT Long Term Goals - 07/26/17 2036      PT LONG TERM GOAL #1   Title  Pt. able to transfer from w/c to least assistive device with mod. I and use of prosthesis safety.      Baseline  mod. I with use of RW or SPC    Time  4    Period  Weeks    Status  Achieved    Target Date  07/26/17      PT LONG TERM GOAL #2   Title  Pt. will increase FOTO to 49 to improve functional mobility.      Baseline  FOTO on 2/6: 34.  3/4: 49    Time  4    Period  Weeks    Status  Achieved    Target Date  07/26/17      PT LONG TERM GOAL #3   Title  Pt. able to don/ doff L prosthesis with mod. I prior to PT tx. session to promote ability to walk to car/ ambulate into PT clinic.      Baseline  mod. I for extra time.     Time  4    Period  Weeks    Status  Achieved    Target Date  07/26/17      PT LONG TERM GOAL #4   Title  Pt. able to ambulate 300 feet with use of least assistive device and prosthesis to improve safe functional mobility.      Baseline  pt. ambulates approx. 100 feet with RW and mod./ SBA or SPC and min. A for safety/ verbal cuing.     Time  4    Period  Weeks    Status  Partially Met    Target Date  08/23/17      PT LONG TERM GOAL #5   Title  Pt. able to amb. up/down  ramp at home with mod. I and use of prosthesis safely.      Baseline  use of RW and SBA for safety    Time  4    Period  Weeks    Status  Partially Met    Target Date  08/23/17  Additional Long Term Goals   Additional Long Term Goals  Yes      PT LONG TERM GOAL #6   Title  Pt. able to amb. community distance with use of SPC on level surfaces safety with no LOB.     Baseline  pt. able to amb. with SPC and min. A for safety.  Pt. fearful of falling.     Time  4    Period  Weeks    Status  New    Target Date  08/23/17            Plan - 08/23/17 1917    Clinical Presentation  Evolving    Clinical Decision Making  Moderate    Rehab Potential  Good    PT Frequency  2x / week    PT Duration  4 weeks    PT Treatment/Interventions  ADLs/Self Care Home Management;Therapeutic activities;Functional mobility training;Stair training;Gait training;Therapeutic exercise;Balance training;Neuromuscular re-education;Patient/family education;Manual techniques;Scar mobilization;Passive range of motion;Prosthetic Training    PT Next Visit Plan  Gait training to least resistive device/ LE strengthening.  CHECK GOALS/ TX SCHEDULE.         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 BKA stump complication ([place]) 16/02/9603  . 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 S/P BKA (below knee amputation)  unilateral, left ([place]) 02/01/2017  . Chronic diastolic heart failure ([place]) 01/21/2017  . Pressure injury of skin 01/18/2017  . 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 Diabetic foot ulcer ([place]) 12/29/2016  . Ataxia 10/21/2016  . History of femoral angiogram 09/06/2016  . Left leg pain 09/06/2016  . Leukocytosis 09/06/2016  . Generalized weakness 09/06/2016  . A-fib ([place]) 08/30/2016  . Pneumonia 05/19/2016  . Hyperlipidemia 04/20/2016  . Back pain 04/19/2016  . Type 2 diabetes mellitus treated with insulin ([place]) 04/19/2016  . Essential hypertension 04/19/2016  . PAD (peripheral artery disease) ([place]) 04/19/2016  . Erectile dysfunction following radical prostatectomy 06/25/2015  . History of prostate cancer 12/23/2014  . Incontinence 12/23/2014   Pura Spice, PT, DPT # [id number] 08/23/2017, 7:35 PM  [facility] [address] [place], [place], [address] Phone: [phone]   Fax:  [phone]  Name: [name] MRN: [id number] Date of Birth: 03-Sep-1934

## 2017-08-23 ENCOUNTER — Ambulatory Visit: Payer: Medicare Other | Attending: Vascular Surgery | Admitting: Physical Therapy

## 2017-08-23 ENCOUNTER — Encounter: Payer: Self-pay | Admitting: Physical Therapy

## 2017-08-23 DIAGNOSIS — R269 Unspecified abnormalities of gait and mobility: Secondary | ICD-10-CM | POA: Diagnosis present

## 2017-08-23 DIAGNOSIS — S88112A Complete traumatic amputation at level between knee and ankle, left lower leg, initial encounter: Secondary | ICD-10-CM

## 2017-08-23 DIAGNOSIS — M6281 Muscle weakness (generalized): Secondary | ICD-10-CM | POA: Insufficient documentation

## 2017-08-23 NOTE — Therapy (Signed)
[facility] [address] [place], [place], [address] Phone: [phone]   Fax:  [phone]  Physical Therapy Treatment  Patient Details  Name: [name] MRN: [id number] Date of Birth: [birth year] Referring Provider: Dr. Leotis Pain   Encounter Date: 08/23/2017  PT End of Session - 08/23/17 1916    Visit Number  16    Number of Visits  17    Date for PT Re-Evaluation  08/23/17    PT Start Time  1021    PT Stop Time  1117    PT Time Calculation (min)  56 min    Activity Tolerance  Patient tolerated treatment well    Behavior During Therapy  [facility] for tasks assessed/performed       Past Medical History:  Diagnosis Date  . Arthritis   . Atrial fibrillation ([place])   . Carcinoma of prostate ([place])   . CHF (congestive heart failure) ([place])   . Chronic kidney disease   . Diabetes mellitus without complication ([place])   . ED (erectile dysfunction)   . Frequent urination   . Hyperlipidemia   . Hypertension   . Moderate mitral insufficiency   . Peripheral vascular disease ([place])   . Pneumonia 04/2016  . Prostate cancer ([place])   . Sleep apnea    OSA--USE C-PAP  . Ulcer of left foot due to type 2 diabetes mellitus ([place])   . Urinary stress incontinence, male     Past Surgical History:  Procedure Laterality Date  . AMPUTATION Left 01/12/2017   Procedure: AMPUTATION BELOW KNEE;  Surgeon: Algernon Huxley, MD;  Location: [facility];  Service: General;  Laterality: Left;  . APLIGRAFT PLACEMENT Left 10/15/2016   Procedure: APLIGRAFT PLACEMENT;  Surgeon: Albertine Patricia, DPM;  Location: [facility];  Service: Podiatry;  Laterality: Left;  necrotic ulcer  . CHOLECYSTECTOMY    . EYE SURGERY Bilateral    Cataract Extraction with IOL  . IRRIGATION AND DEBRIDEMENT FOOT Left 10/15/2016   Procedure: IRRIGATION AND DEBRIDEMENT FOOT-EXCISIONAL DEBRIDEMENT OF SKIN 3RD, 4TH AND 5TH TOES WITH APPLICATION OF APLIGRAFT;  Surgeon: Albertine Patricia, DPM;   Location: [facility];  Service: Podiatry;  Laterality: Left;  necrotic,gangrene  . IRRIGATION AND DEBRIDEMENT FOOT Left 12/09/2016   Procedure: IRRIGATION AND DEBRIDEMENT FOOT-MUSCLE/FASCIA, RESECTION OF FOURTH AND FIFTH METATARSAL NECROTIC BONE;  Surgeon: Albertine Patricia, DPM;  Location: [facility];  Service: Podiatry;  Laterality: Left;  . LOWER EXTREMITY ANGIOGRAPHY Left 08/16/2016   Procedure: Lower Extremity Angiography;  Surgeon: Algernon Huxley, MD;  Location: [place] [facility];  Service: Cardiovascular;  Laterality: Left;  . LOWER EXTREMITY ANGIOGRAPHY Left 09/01/2016   Procedure: Lower Extremity Angiography;  Surgeon: Algernon Huxley, MD;  Location: [place] [facility];  Service: Cardiovascular;  Laterality: Left;  . LOWER EXTREMITY ANGIOGRAPHY Left 10/14/2016   Procedure: Lower Extremity Angiography;  Surgeon: Algernon Huxley, MD;  Location: [place] [facility];  Service: Cardiovascular;  Laterality: Left;  . LOWER EXTREMITY ANGIOGRAPHY Left 12/20/2016   Procedure: Lower Extremity Angiography;  Surgeon: Algernon Huxley, MD;  Location:  CV LAB;  Service: Cardiovascular;  Laterality: Left;  . LOWER EXTREMITY INTERVENTION  09/01/2016   Procedure: Lower Extremity Intervention;  Surgeon: Algernon Huxley, MD;  Location: [place] [facility];  Service: Cardiovascular;;  . PROSTATE SURGERY     removal  . TONSILLECTOMY     as a child    There were no vitals filed for this visit.  Subjective Assessment - 08/23/17 1911    Subjective  Pt. reports his f/u with Staci Righter when well and prosthesis was adjusted/ realigned.  Pt. still c/o discomfort with prosthetic use and entered PT with use of personal SPC.  No LOB or falls.      Patient is accompained by:  Family member    Limitations  Lifting;Standing;Walking;House hold activities    Patient Stated Goals  improve walking/ standing tolerance and safety.      Currently in Pain?  Yes    Pain Score  2     Pain Location  Leg    Pain Orientation   Left;Lateral    Pain Descriptors / Indicators  Aching          Treatment:  There.ex:  Scifit L610 min B UE/LE (warm up/no charge). Standing hip flexion/ abd./ extension/ knee flexion 20x each (min. To no UE assist). Recip. Step touches/ ups with 1 UE assist 10x.  Seated LAQ/ marching 20x each. Tandem walking forward in //-bars 2x (difficulty/ CGA for safety).  Gait:  Amb. In hallway working on consistent step pattern, esp. R swing through phase of gait.  Amb. In //-bars with no UE assist working on posture/ consistent BOS and step pattern.  Amb. In PT clinic with no SPC and SBA/CGA and max. Cuing for proper upright posture.  2 episodes of scissoring gait but mod. I to self correct.        PT Long Term Goals - 07/26/17 2036      PT LONG TERM GOAL #1   Title  Pt. able to transfer from w/c to least assistive device with mod. I and use of prosthesis safety.      Baseline  mod. I with use of RW or SPC    Time  4    Period  Weeks    Status  Achieved    Target Date  07/26/17      PT LONG TERM GOAL #2   Title  Pt. will increase FOTO to 49 to improve functional mobility.      Baseline  FOTO on 2/6: 34.  3/4: 49    Time  4    Period  Weeks    Status  Achieved    Target Date  07/26/17      PT LONG TERM GOAL #3   Title  Pt. able to don/ doff L prosthesis with mod. I prior to PT tx. session to promote ability to walk to car/ ambulate into PT clinic.      Baseline  mod. I for extra time.     Time  4    Period  Weeks    Status  Achieved    Target Date  07/26/17      PT LONG TERM GOAL #4   Title  Pt. able to ambulate 300 feet with use of least assistive device and prosthesis to improve safe functional mobility.      Baseline  pt. ambulates approx. 100 feet with RW and mod./ SBA or SPC and min. A for safety/ verbal cuing.     Time  4    Period  Weeks    Status  Partially Met    Target Date  08/23/17      PT LONG TERM GOAL #5   Title  Pt. able to amb. up/down  ramp at home with mod. I and use of prosthesis safely.      Baseline  use of RW and SBA for  safety    Time  4    Period  Weeks    Status  Partially Met    Target Date  08/23/17      Additional Long Term Goals   Additional Long Term Goals  Yes      PT LONG TERM GOAL #6   Title  Pt. able to amb. community distance with use of SPC on level surfaces safety with no LOB.     Baseline  pt. able to amb. with SPC and min. A for safety.  Pt. fearful of falling.     Time  4    Period  Weeks    Status  New    Target Date  08/23/17            Plan - 08/23/17 1917    Clinical Impression Statement  Pt. is progressing to independent ambulation with no assistive device on level surfaces.  Pt. benefits from use of SPC when walking at home/ level outside surfaces and in PT clinic with mod. I.  SBA/CGA with safe ambulation without use of SPC in PT gym working on recip. step pattern/ maintaining proper BOS.      Clinical Presentation  Evolving    Clinical Decision Making  Moderate    Rehab Potential  Good    PT Frequency  2x / week    PT Duration  4 weeks    PT Treatment/Interventions  ADLs/Self Care Home Management;Therapeutic activities;Functional mobility training;Stair training;Gait training;Therapeutic exercise;Balance training;Neuromuscular re-education;Patient/family education;Manual techniques;Scar mobilization;Passive range of motion;Prosthetic Training    PT Next Visit Plan  Gait training to least resistive device/ LE strengthening.  CHECK GOALS/ TX SCHEDULE.         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 BKA stump complication ([place]) 62/37/6283  . 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 S/P BKA (below knee amputation) unilateral, left ([place]) 02/01/2017  . Chronic diastolic heart failure ([place]) 01/21/2017  . Pressure injury of skin 01/18/2017  . 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 Diabetic foot ulcer ([place]) 12/29/2016  . Ataxia 10/21/2016  . History of femoral angiogram 09/06/2016  . Left leg pain 09/06/2016  . Leukocytosis 09/06/2016  . Generalized weakness 09/06/2016  . A-fib ([place]) 08/30/2016  . Pneumonia 05/19/2016  . Hyperlipidemia 04/20/2016  . Back pain 04/19/2016  . Type 2 diabetes mellitus treated with insulin ([place]) 04/19/2016  . Essential hypertension 04/19/2016  . PAD (peripheral artery disease) ([place]) 04/19/2016  . Erectile dysfunction following radical prostatectomy 06/25/2015  . History of prostate cancer 12/23/2014  . Incontinence 12/23/2014   Pura Spice, PT, DPT # [id number] 08/23/2017, 7:45 PM   [facility] [address] [place], [place], [address] Phone: [phone]   Fax:  [phone]  Name: [name] MRN: [id number] Date of Birth: [birth year]

## 2017-08-25 ENCOUNTER — Ambulatory Visit: Payer: Medicare Other | Admitting: Physical Therapy

## 2017-08-25 ENCOUNTER — Encounter: Payer: Self-pay | Admitting: Physical Therapy

## 2017-08-25 DIAGNOSIS — S88112A Complete traumatic amputation at level between knee and ankle, left lower leg, initial encounter: Secondary | ICD-10-CM

## 2017-08-25 DIAGNOSIS — M6281 Muscle weakness (generalized): Secondary | ICD-10-CM

## 2017-08-25 DIAGNOSIS — R269 Unspecified abnormalities of gait and mobility: Secondary | ICD-10-CM

## 2017-08-26 ENCOUNTER — Ambulatory Visit (INDEPENDENT_AMBULATORY_CARE_PROVIDER_SITE_OTHER): Payer: Medicare Other

## 2017-08-26 ENCOUNTER — Encounter (INDEPENDENT_AMBULATORY_CARE_PROVIDER_SITE_OTHER): Payer: Self-pay | Admitting: Vascular Surgery

## 2017-08-26 ENCOUNTER — Ambulatory Visit (INDEPENDENT_AMBULATORY_CARE_PROVIDER_SITE_OTHER): Payer: Medicare Other | Admitting: Vascular Surgery

## 2017-08-26 VITALS — BP 147/91 | HR 121 | Resp 15 | Ht 69.0 in | Wt 175.0 lb

## 2017-08-26 DIAGNOSIS — I739 Peripheral vascular disease, unspecified: Secondary | ICD-10-CM

## 2017-08-26 DIAGNOSIS — I10 Essential (primary) hypertension: Secondary | ICD-10-CM

## 2017-08-26 DIAGNOSIS — I482 Chronic atrial fibrillation, unspecified: Secondary | ICD-10-CM

## 2017-08-26 DIAGNOSIS — I70269 Atherosclerosis of native arteries of extremities with gangrene, unspecified extremity: Secondary | ICD-10-CM

## 2017-08-26 NOTE — Progress Notes (Signed)
MRN : [id number]  [name] is a 83 y.o. (1934-06-01) male who presents with chief complaint of  Chief Complaint  Patient presents with  . Follow-up    ABI 1 month f/u  .  History of Present Illness: Patient returns today in follow up of PAD and left leg amputation.  He is now over 6 months after left below-knee amputation.  He is walking with his prosthesis.  His right ABI today is noncompressible.  His waveforms are fair but not normal in the right lower extremity.  No ulceration or infection in the right foot.  No rest pain or claudication symptoms.  Current Outpatient Medications  Medication Sig Dispense Refill  . atorvastatin (LIPITOR) 80 MG tablet Take 80 mg by mouth daily at 6 PM.     . digoxin (LANOXIN) 0.125 MG tablet Take 1 tablet (0.125 mg total) by mouth daily.    Marland Kitchen diltiazem (CARDIZEM CD) 300 MG 24 hr capsule Take 300 mg by mouth daily.    . furosemide (LASIX) 20 MG tablet Take 1 tablet (20 mg total) by mouth daily. 30 tablet 0  . insulin aspart (NOVOLOG) 100 UNIT/ML injection Inject 0-5 Units into the skin 3 (three) times daily with meals.     . insulin glargine (LANTUS) 100 UNIT/ML injection Inject 0.1 mLs (10 Units total) into the skin at bedtime. 10 mL 11  . metFORMIN (GLUCOPHAGE) 1000 MG tablet TAKE 1 TABLET TWICE A DAY WITH MEALS    . oxyCODONE (OXY IR/ROXICODONE) 5 MG immediate release tablet Take 1 tablet (5 mg total) by mouth every 6 (six) hours as needed for moderate pain. 30 tablet 0  . oxyCODONE (OXYCONTIN) 10 mg 12 hr tablet Take 1 tablet (10 mg total) by mouth every 12 (twelve) hours. 14 tablet 0  . silver sulfADIAZINE (SILVADENE) 1 % cream Apply topically daily. 50 g 0  . TOPROL XL 100 MG 24 hr tablet Take 100 mg by mouth daily.    . TRULICITY 1.27 NT/7.0YF SOPN 0.75 mg once a week.              Current Facility-Administered Medications  Medication Dose Route Frequency Provider Last Rate Last Dose  . docusate sodium (COLACE) capsule  100 mg  100 mg Oral BID Stegmayer, Kimberly A, PA-C      . heparin injection 5,000 Units  5,000 Units Subcutaneous Q8H Stegmayer, Kimberly A, PA-C      . piperacillin-tazobactam (ZOSYN) 3.375 g in dextrose 5 % 50 mL IVPB  3.375 g Intravenous Q6H Stegmayer, Kimberly A, PA-C      . senna (SENOKOT) tablet 8.6 mg  1 tablet Oral Daily PRN Stegmayer, Kimberly A, PA-C                Facility-Administered Medications Ordered in Other Visits  Medication Dose Route Frequency Provider Last Rate Last Dose  . acetaminophen (TYLENOL) tablet 650 mg  650 mg Oral Q6H PRN Stegmayer, Kimberly A, PA-C       Or  . acetaminophen (TYLENOL) suppository 650 mg  650 mg Rectal Q6H PRN Stegmayer, Kimberly A, PA-C      . ondansetron (ZOFRAN) tablet 4 mg  4 mg Oral Q6H PRN Stegmayer, Kimberly A, PA-C       Or  . ondansetron (ZOFRAN) 4 mg in sodium chloride 0.9 % 50 mL IVPB  4 mg Intravenous Q6H PRN Stegmayer, Janalyn Harder, PA-C            Past Medical History:  Diagnosis Date  . Arthritis   . Atrial fibrillation ([place])   . Carcinoma of prostate ([place])   . CHF (congestive heart failure) ([place])   . Chronic kidney disease   . Diabetes mellitus without complication ([place])   . ED (erectile dysfunction)   . Frequent urination   . Hyperlipidemia   . Hypertension   . Moderate mitral insufficiency   . Peripheral vascular disease ([place])   . Pneumonia 04/2016  . Prostate cancer ([place])   . Sleep apnea    OSA--USE C-PAP  . Ulcer of left foot due to type 2 diabetes mellitus ([place])   . Urinary stress incontinence, male          Past Surgical History:  Procedure Laterality Date  . AMPUTATION Left 01/12/2017   Procedure: AMPUTATION BELOW KNEE;  Surgeon: Algernon Huxley, MD;  Location: [facility];  Service: General;  Laterality: Left;  . APLIGRAFT PLACEMENT Left 10/15/2016   Procedure: APLIGRAFT PLACEMENT;  Surgeon: Albertine Patricia, DPM;  Location: [facility];  Service: Podiatry;  Laterality: Left;  necrotic  ulcer  . CHOLECYSTECTOMY    . EYE SURGERY Bilateral    Cataract Extraction with IOL  . IRRIGATION AND DEBRIDEMENT FOOT Left 10/15/2016   Procedure: IRRIGATION AND DEBRIDEMENT FOOT-EXCISIONAL DEBRIDEMENT OF SKIN 3RD, 4TH AND 5TH TOES WITH APPLICATION OF APLIGRAFT;  Surgeon: Albertine Patricia, DPM;  Location: [facility];  Service: Podiatry;  Laterality: Left;  necrotic,gangrene  . IRRIGATION AND DEBRIDEMENT FOOT Left 12/09/2016   Procedure: IRRIGATION AND DEBRIDEMENT FOOT-MUSCLE/FASCIA, RESECTION OF FOURTH AND FIFTH METATARSAL NECROTIC BONE;  Surgeon: Albertine Patricia, DPM;  Location: [facility];  Service: Podiatry;  Laterality: Left;  . LOWER EXTREMITY ANGIOGRAPHY Left 08/16/2016   Procedure: Lower Extremity Angiography;  Surgeon: Algernon Huxley, MD;  Location: [place] [facility];  Service: Cardiovascular;  Laterality: Left;  . LOWER EXTREMITY ANGIOGRAPHY Left 09/01/2016   Procedure: Lower Extremity Angiography;  Surgeon: Algernon Huxley, MD;  Location: [place] [facility];  Service: Cardiovascular;  Laterality: Left;  . LOWER EXTREMITY ANGIOGRAPHY Left 10/14/2016   Procedure: Lower Extremity Angiography;  Surgeon: Algernon Huxley, MD;  Location: [place] [facility];  Service: Cardiovascular;  Laterality: Left;  . LOWER EXTREMITY ANGIOGRAPHY Left 12/20/2016   Procedure: Lower Extremity Angiography;  Surgeon: Algernon Huxley, MD;  Location: [place] [facility];  Service: Cardiovascular;  Laterality: Left;  . LOWER EXTREMITY INTERVENTION  09/01/2016   Procedure: Lower Extremity Intervention;  Surgeon: Algernon Huxley, MD;  Location: [place] [facility];  Service: Cardiovascular;;  . PROSTATE SURGERY     removal  . TONSILLECTOMY     as a child         Family History  Problem Relation Age of Onset  . Prostate cancer Neg Hx   . Bladder Cancer Neg Hx   . Kidney disease Neg Hx   No bleeding disorders, clotting disorders, autoimmune diseases, or aneurysms  Social History      Social  History  Substance Use Topics  . Smoking status: Former Smoker    Quit date: 10/14/1994  . Smokeless tobacco: Never Used  . Alcohol use No  Married, wife accompanies today Son and daughter are also present today.       Allergies  Allergen Reactions  . Contrast Media [Iodinated Diagnostic Agents]     SOB  . Iohexol     Desc: Respiratory Distress, Laryngedema, diaphoresis, Onset Date: [id number]   . Latex Rash  REVIEW OF SYSTEMS(Negative unless checked)  Constitutional: [] Weight loss[] Fever[] Chills Cardiac:[] Chest pain[] Chest pressure[x] Palpitations [] Shortness of breath when laying flat [] Shortness of breath at rest [x] Shortness of breath with exertion. Vascular: [x] Pain in legs with walking[] Pain in legsat rest[] Pain in legs when laying flat [] Claudication [x] Pain in feet when walking [x] Pain in feet at rest [x] Pain in feet when laying flat [] History of DVT [] Phlebitis [x] Swelling in legs [] Varicose veins [x] Non-healing ulcers Pulmonary: [] Uses home oxygen [x] Productive cough[] Hemoptysis [] Wheeze [] COPD [] Asthma Neurologic: [] Dizziness [] Blackouts [] Seizures [] History of stroke [] History of TIA[] Aphasia [] Temporary blindness[] Dysphagia [] Weaknessor numbness in arms [] Weakness or numbnessin legs Musculoskeletal: [x] Arthritis [] Joint swelling [] Joint pain [] Low back pain Hematologic:[] Easy bruising[] Easy bleeding [] Hypercoagulable state [] Anemic [] Hepatitis Gastrointestinal:[] Blood in stool[] Vomiting blood[] Gastroesophageal reflux/heartburn[] Abdominal pain Genitourinary: [] Chronic kidney disease [] Difficulturination [] Frequenturination [] Burning with urination[] Hematuria Skin: [] Rashes [x] Ulcers [x] Wounds Psychological: [] History of anxiety[] History of major depression.     Physical Examination  There were no vitals taken for this  visit. Gen:  WD/WN, NAD Head: [place]/AT, No temporalis wasting. Ear/Nose/Throat: Hearing grossly intact, nares w/o erythema or drainage Eyes: Conjunctiva clear. Sclera non-icteric Neck: Supple.  Trachea midline Pulmonary:  Good air movement, no use of accessory muscles.  Cardiac: Irregular Vascular:  Vessel Right Left  Radial Palpable Palpable                          PT  1+ palpable  not palpable  DP  1+ palpable  not palpable    Musculoskeletal: M/S 5/5 throughout.  Walking with a left below-knee amputation prosthesis and cane. Neurologic: Sensation grossly intact in extremities.  Symmetrical.  Speech is fluent.  Psychiatric: Judgment intact, Mood & affect appropriate for pt's clinical situation. Dermatologic: No rashes or ulcers noted.  No cellulitis or open wounds.       Labs Recent Results (from the past 2160 hour(s))  BLADDER SCAN AMB NON-IMAGING     Status: None   Collection Time: 08/19/17 11:18 AM  Result Value Ref Range   Scan Result 46     Radiology No results found.  Assessment/Plan Hyperlipidemia lipid control important in reducing the progression of atherosclerotic disease. Continue statin therapy   Type 2 diabetes mellitus treated with insulin (HCC) blood glucose control important in reducing the progression of atherosclerotic disease. Also, involved in wound healing. On appropriate medications.   Essential hypertension blood pressure control important in reducing the progression of atherosclerotic disease. On appropriate oral medications.  A-fib (HCC) On anticoagulation. Rapid A. fib lead to a prolonged hospitalization last year.   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 His right ABI today is noncompressible.  His waveforms are fair but not normal in the right lower extremity.  No role for intervention at this time is no significant symptoms with fair perfusion.  Recheck in 6  months.      Leotis Pain, MD  08/26/2017 1:35 PM    This note was created with Dragon medical transcription system.  Any errors from dictation are purely unintentional

## 2017-08-30 ENCOUNTER — Ambulatory Visit (INDEPENDENT_AMBULATORY_CARE_PROVIDER_SITE_OTHER): Payer: Medicare Other | Admitting: Vascular Surgery

## 2017-08-30 ENCOUNTER — Ambulatory Visit: Payer: Medicare Other | Admitting: Physical Therapy

## 2017-08-30 DIAGNOSIS — R269 Unspecified abnormalities of gait and mobility: Secondary | ICD-10-CM

## 2017-08-30 DIAGNOSIS — M6281 Muscle weakness (generalized): Secondary | ICD-10-CM | POA: Diagnosis not present

## 2017-08-30 DIAGNOSIS — S88112A Complete traumatic amputation at level between knee and ankle, left lower leg, initial encounter: Secondary | ICD-10-CM

## 2017-08-31 ENCOUNTER — Ambulatory Visit (INDEPENDENT_AMBULATORY_CARE_PROVIDER_SITE_OTHER): Payer: Medicare Other | Admitting: Vascular Surgery

## 2017-08-31 NOTE — Therapy (Signed)
[facility] [address]. [place], [place], [address] Phone: [phone]   Fax:  [phone]  Physical Therapy Treatment  Patient Details  Name: [name] MRN: [id number] Date of Birth: 1935/03/03 Referring Provider: Dr. Leotis Pain   Encounter Date: 08/30/2017    PT End of Session - 08/31/17 1736    Visit Number  18    Number of Visits  25    Date for PT Re-Evaluation  09/22/17    PT Start Time  1027    PT Stop Time  1124    PT Time Calculation (min)  57 min    Activity Tolerance  Patient tolerated treatment well    Behavior During Therapy  [facility] for tasks assessed/performed          Past Medical History:  Diagnosis Date  . Arthritis   . Atrial fibrillation ([place])   . Carcinoma of prostate ([place])   . CHF (congestive heart failure) ([place])   . Chronic kidney disease   . Diabetes mellitus without complication ([place])   . ED (erectile dysfunction)   . Frequent urination   . Hyperlipidemia   . Hypertension   . Moderate mitral insufficiency   . Peripheral vascular disease ([place])   . Pneumonia 04/2016  . Prostate cancer ([place])   . Sleep apnea    OSA--USE C-PAP  . Ulcer of left foot due to type 2 diabetes mellitus ([place])   . Urinary stress incontinence, male     Past Surgical History:  Procedure Laterality Date  . AMPUTATION Left 01/12/2017   Procedure: AMPUTATION BELOW KNEE;  Surgeon: Algernon Huxley, MD;  Location: [facility];  Service: General;  Laterality: Left;  . APLIGRAFT PLACEMENT Left 10/15/2016   Procedure: APLIGRAFT PLACEMENT;  Surgeon: Albertine Patricia, DPM;  Location: [facility];  Service: Podiatry;  Laterality: Left;  necrotic ulcer  . CHOLECYSTECTOMY    . EYE SURGERY Bilateral    Cataract Extraction with IOL  . IRRIGATION AND DEBRIDEMENT FOOT Left 10/15/2016   Procedure: IRRIGATION AND DEBRIDEMENT FOOT-EXCISIONAL DEBRIDEMENT OF SKIN 3RD, 4TH AND 5TH TOES WITH APPLICATION OF APLIGRAFT;  Surgeon: Albertine Patricia,  DPM;  Location: [facility];  Service: Podiatry;  Laterality: Left;  necrotic,gangrene  . IRRIGATION AND DEBRIDEMENT FOOT Left 12/09/2016   Procedure: IRRIGATION AND DEBRIDEMENT FOOT-MUSCLE/FASCIA, RESECTION OF FOURTH AND FIFTH METATARSAL NECROTIC BONE;  Surgeon: Albertine Patricia, DPM;  Location: [facility];  Service: Podiatry;  Laterality: Left;  . LOWER EXTREMITY ANGIOGRAPHY Left 08/16/2016   Procedure: Lower Extremity Angiography;  Surgeon: Algernon Huxley, MD;  Location: [place] [facility];  Service: Cardiovascular;  Laterality: Left;  . LOWER EXTREMITY ANGIOGRAPHY Left 09/01/2016   Procedure: Lower Extremity Angiography;  Surgeon: Algernon Huxley, MD;  Location: [place] [facility];  Service: Cardiovascular;  Laterality: Left;  . LOWER EXTREMITY ANGIOGRAPHY Left 10/14/2016   Procedure: Lower Extremity Angiography;  Surgeon: Algernon Huxley, MD;  Location: [place] [facility];  Service: Cardiovascular;  Laterality: Left;  . LOWER EXTREMITY ANGIOGRAPHY Left 12/20/2016   Procedure: Lower Extremity Angiography;  Surgeon: Algernon Huxley, MD;  Location: [place] [facility];  Service: Cardiovascular;  Laterality: Left;  . LOWER EXTREMITY INTERVENTION  09/01/2016   Procedure: Lower Extremity Intervention;  Surgeon: Algernon Huxley, MD;  Location: [place] [facility];  Service: Cardiovascular;;  . PROSTATE SURGERY     removal  . TONSILLECTOMY     as a child    There were no  vitals filed for this visit.    Pt. said appt. with Dr. Lucky Cowboy went well. Pt. states he made another appt. with Dr. Lucky Cowboy for this afternoon due to a rash on distal aspect of L residual limb.          Treatment:  PT assessed L residual limb/ discussed cleaning gel liner.  There.ex:  [id number] min B UE/LE (warm up/no charge). Standing hip flexion/ abd./ extension/ knee flexion 20x each (min. To noUE assist). Recip. Step touches with heel/ toe (min. To no UE assist in //-bars). Maneuvering cones in hallway with no UE assist or  SPC Step ups/ downs 10x2.  Unable to step up with L LE without UE assist (required handrails).  BOSU step ups/ downs in //-bars.    Gait:  Amb. In hallwayworking on consistent step pattern, esp. R swing through phase of gait.  Ambulate in clinic working on prevent toe scrap/ shuffle and increase L hip flexion to promote safety.         Numerous red bumps/ rash noted on distal aspects of L residual limb. Pt. is scheduled to see Dr. Lucky Cowboy to have skin assessed due to recent rash. PT recommeds pt. keep residual limb and gel liner clean. Pt. ambulates with consisent gait pattern with use of SPC. Pt. working on increasing cadence while maintaining upright posture/ balance. Good endurance with standing/ walking tasks.    PT Long Term Goals - 08/31/17 1327      PT LONG TERM GOAL #1   Title  Pt. able to transfer from w/c to least assistive device with mod. I and use of prosthesis safety.      Baseline  mod. I with use of RW or SPC    Time  4    Period  Weeks    Status  Achieved    Target Date  07/26/17      PT LONG TERM GOAL #2   Title  Pt. will increase FOTO to 49 to improve functional mobility.      Baseline  FOTO on 2/6: 34.  3/4: 49    Time  4    Period  Weeks    Status  Achieved    Target Date  07/26/17      PT LONG TERM GOAL #3   Title  Pt. able to don/ doff L prosthesis with mod. I prior to PT tx. session to promote ability to walk to car/ ambulate into PT clinic.      Baseline  mod. I for extra time.     Time  4    Period  Weeks    Status  Achieved    Target Date  07/26/17      PT LONG TERM GOAL #4   Title  Pt. able to ambulate 300 feet with use of least assistive device and prosthesis to improve safe functional mobility.      Baseline  450 feet with use of SPC around PT building/ complex with mod. I with good step pattern/ cadence.     Time  4    Period  Weeks    Status  Achieved    Target Date  08/25/17      PT LONG TERM GOAL #5   Title  Pt. able to amb. up/down  ramp at home with mod. I and use of prosthesis safely.      Baseline  mod independent with use of SPC and ramp at home.     Time  4  Period  Weeks    Status  Achieved    Target Date  08/25/17      Additional Long Term Goals   Additional Long Term Goals  Yes      PT LONG TERM GOAL #6   Title  Pt. able to amb. community distance with use of SPC on level surfaces safety with no LOB.     Baseline  pt. able to amb. with SPC and SBA/mod. I for safety outside.  Pt. will use scooter at [employer] to avoid walking due to being fearful of falling.     Time  4    Period  Weeks    Status  Partially Met    Target Date  09/22/17      PT LONG TERM GOAL #7   Title  Pt. able to stand from low chair/ commode with no UE assist safely.      Baseline  Extra time/ several attempts to stand from low chair with use of prosthesis.      Time  4    Period  Weeks    Status  New    Target Date  09/22/17      PT LONG TERM GOAL #8   Title  Pt. will be able to ambulate in yard/ uneven terrain with use of least assistive device and no LOB safely to allow pt. to complete yard work/ mowing.     Baseline  Pt. using SPC to ambulate to riding mower.  Pt. hesitant to ambulate around yard due to fear of falling.      Time  4    Period  Weeks    Status  New    Target Date  09/22/17              Patient will benefit from skilled therapeutic intervention in order to improve the following deficits and impairments:  Abnormal gait, Decreased balance, Decreased endurance, Decreased mobility, Decreased skin integrity, Difficulty walking, Improper body mechanics, Decreased range of motion, Decreased activity tolerance, Decreased strength, Decreased safety awareness, Impaired flexibility  Visit Diagnosis: Muscle weakness (generalized)  Gait difficulty  Complete below knee amputation of left lower extremity, initial encounter [facility])     Problem List Patient Active Problem List   Diagnosis Date Noted  . BKA stump  complication ([place]) 81/77/1165  . 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 S/P BKA (below knee amputation) unilateral, left ([place]) 02/01/2017  . Chronic diastolic heart failure ([place]) 01/21/2017  . Pressure injury of skin 01/18/2017  . 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 Diabetic foot ulcer ([place]) 12/29/2016  . Ataxia 10/21/2016  . History of femoral angiogram 09/06/2016  . Left leg pain 09/06/2016  . Leukocytosis 09/06/2016  . Generalized weakness 09/06/2016  . A-fib ([place]) 08/30/2016  . Pneumonia 05/19/2016  . Hyperlipidemia 04/20/2016  . Back pain 04/19/2016  . Type 2 diabetes mellitus treated with insulin ([place]) 04/19/2016  . Essential hypertension 04/19/2016  . PAD (peripheral artery disease) ([place]) 04/19/2016  . Erectile dysfunction following radical prostatectomy 06/25/2015  . History of prostate cancer 12/23/2014  . Incontinence 12/23/2014   Pura Spice, PT, DPT # [phone] 09/02/2017, 8:00 PM  [facility] [address] [place], [place], [address] Phone: [phone]   Fax:  [phone]  Name: [name] MRN: [id number] Date of Birth: 01/05/1935

## 2017-08-31 NOTE — Therapy (Signed)
[facility] [address]. [place], [place], [address] Phone: [phone]   Fax:  [phone]  Physical Therapy Treatment  Patient Details  Name: [name] MRN: [id number] Date of Birth: [birth year] Referring Provider: Dr. Leotis Pain   Encounter Date: 08/25/2017    Treatment: 71 of 25.  Recert date: 07/01/76.   Past Medical History:  Diagnosis Date  . Arthritis   . Atrial fibrillation ([place])   . Carcinoma of prostate ([place])   . CHF (congestive heart failure) ([place])   . Chronic kidney disease   . Diabetes mellitus without complication ([place])   . ED (erectile dysfunction)   . Frequent urination   . Hyperlipidemia   . Hypertension   . Moderate mitral insufficiency   . Peripheral vascular disease ([place])   . Pneumonia 04/2016  . Prostate cancer ([place])   . Sleep apnea    OSA--USE C-PAP  . Ulcer of left foot due to type 2 diabetes mellitus ([place])   . Urinary stress incontinence, male     Past Surgical History:  Procedure Laterality Date  . AMPUTATION Left 01/12/2017   Procedure: AMPUTATION BELOW KNEE;  Surgeon: Algernon Huxley, MD;  Location: [facility];  Service: General;  Laterality: Left;  . APLIGRAFT PLACEMENT Left 10/15/2016   Procedure: APLIGRAFT PLACEMENT;  Surgeon: Albertine Patricia, DPM;  Location: [facility];  Service: Podiatry;  Laterality: Left;  necrotic ulcer  . CHOLECYSTECTOMY    . EYE SURGERY Bilateral    Cataract Extraction with IOL  . IRRIGATION AND DEBRIDEMENT FOOT Left 10/15/2016   Procedure: IRRIGATION AND DEBRIDEMENT FOOT-EXCISIONAL DEBRIDEMENT OF SKIN 3RD, 4TH AND 5TH TOES WITH APPLICATION OF APLIGRAFT;  Surgeon: Albertine Patricia, DPM;  Location: [facility];  Service: Podiatry;  Laterality: Left;  necrotic,gangrene  . IRRIGATION AND DEBRIDEMENT FOOT Left 12/09/2016   Procedure: IRRIGATION AND DEBRIDEMENT FOOT-MUSCLE/FASCIA, RESECTION OF FOURTH AND FIFTH METATARSAL NECROTIC BONE;  Surgeon: Albertine Patricia, DPM;  Location:  [facility];  Service: Podiatry;  Laterality: Left;  . LOWER EXTREMITY ANGIOGRAPHY Left 08/16/2016   Procedure: Lower Extremity Angiography;  Surgeon: Algernon Huxley, MD;  Location: [place] [facility];  Service: Cardiovascular;  Laterality: Left;  . LOWER EXTREMITY ANGIOGRAPHY Left 09/01/2016   Procedure: Lower Extremity Angiography;  Surgeon: Algernon Huxley, MD;  Location: [place] [facility];  Service: Cardiovascular;  Laterality: Left;  . LOWER EXTREMITY ANGIOGRAPHY Left 10/14/2016   Procedure: Lower Extremity Angiography;  Surgeon: Algernon Huxley, MD;  Location: [place] [facility];  Service: Cardiovascular;  Laterality: Left;  . LOWER EXTREMITY ANGIOGRAPHY Left 12/20/2016   Procedure: Lower Extremity Angiography;  Surgeon: Algernon Huxley, MD;  Location: [place] [facility];  Service: Cardiovascular;  Laterality: Left;  . LOWER EXTREMITY INTERVENTION  09/01/2016   Procedure: Lower Extremity Intervention;  Surgeon: Algernon Huxley, MD;  Location: [place] [facility];  Service: Cardiovascular;;  . PROSTATE SURGERY     removal  . TONSILLECTOMY     as a child    There were no vitals filed for this visit.     Pt. reports he is doing well.        Treatment:  There.ex:  ScifitL610 min B UE/LE (warm up/no charge). Standing hip flexion/ abd./ extension/ knee flexion 20x each (min. To noUE assist). Recip. Step touches/ ups with 1 UE assist 10x.   Tandem walking forward in //-bars 2x (difficulty/ CGA for safety).  Requires 1 UE assist.   Gait:  Amb.  In hallwayworking on consistent step pattern, esp. R swing through phase of gait.  Ambulate outside with use of SPC (down ramp/ curb), descend/ ascend outside stairs with SPC/ 1 handrail.  Moderate fatigue noted.     PT Long Term Goals - 08/31/17 1327      PT LONG TERM GOAL #1   Title  Pt. able to transfer from w/c to least assistive device with mod. I and use of prosthesis safety.      Baseline  mod. I with use of RW or SPC    Time   4    Period  Weeks    Status  Achieved    Target Date  07/26/17      PT LONG TERM GOAL #2   Title  Pt. will increase FOTO to 49 to improve functional mobility.      Baseline  FOTO on 2/6: 34.  3/4: 49    Time  4    Period  Weeks    Status  Achieved    Target Date  07/26/17      PT LONG TERM GOAL #3   Title  Pt. able to don/ doff L prosthesis with mod. I prior to PT tx. session to promote ability to walk to car/ ambulate into PT clinic.      Baseline  mod. I for extra time.     Time  4    Period  Weeks    Status  Achieved    Target Date  07/26/17      PT LONG TERM GOAL #4   Title  Pt. able to ambulate 300 feet with use of least assistive device and prosthesis to improve safe functional mobility.      Baseline  450 feet with use of SPC around PT building/ complex with mod. I with good step pattern/ cadence.     Time  4    Period  Weeks    Status  Achieved    Target Date  08/25/17      PT LONG TERM GOAL #5   Title  Pt. able to amb. up/down ramp at home with mod. I and use of prosthesis safely.      Baseline  mod independent with use of SPC and ramp at home.     Time  4    Period  Weeks    Status  Achieved    Target Date  08/25/17      Additional Long Term Goals   Additional Long Term Goals  Yes      PT LONG TERM GOAL #6   Title  Pt. able to amb. community distance with use of SPC on level surfaces safety with no LOB.     Baseline  pt. able to amb. with SPC and SBA/mod. I for safety outside.  Pt. will use scooter at [employer] to avoid walking due to being fearful of falling.     Time  4    Period  Weeks    Status  Partially Met    Target Date  09/22/17      PT LONG TERM GOAL #7   Title  Pt. able to stand from low chair/ commode with no UE assist safely.      Baseline  Extra time/ several attempts to stand from low chair with use of prosthesis.      Time  4    Period  Weeks    Status  New    Target Date  09/22/17  PT LONG TERM GOAL #8   Title  Pt. will be able to  ambulate in yard/ uneven terrain with use of least assistive device and no LOB safely to allow pt. to complete yard work/ mowing.     Baseline  Pt. using SPC to ambulate to riding mower.  Pt. hesitant to ambulate around yard due to fear of falling.      Time  4    Period  Weeks    Status  New    Target Date  09/22/17          Plan - 08/31/17 1316    Clinical Impression Statement  Pt. is progressing to independent ambulation with no assistive device on level surfaces.  Pt. benefits from use of SPC when walking on outside surfaces or fatigued to promote safety.  Pt. ambulates with consistent step pattern/ maintaining proper BOS with occasional scuffing of R foot.  Pt. able to correct with increase R hip/knee flexion with focus on heel strike.  No LOB during PT tx. session and good overall standing/ walking endurance.  Pt. will continue to benefit from skilled PT services to increase B LE muscle strength to improve independence/ safety with gait pattern.       Clinical Presentation  Evolving    Clinical Decision Making  Moderate    Rehab Potential  Good    PT Frequency  2x / week    PT Duration  4 weeks    PT Treatment/Interventions  ADLs/Self Care Home Management;Therapeutic activities;Functional mobility training;Stair training;Gait training;Therapeutic exercise;Balance training;Neuromuscular re-education;Patient/family education;Manual techniques;Scar mobilization;Passive range of motion;Prosthetic Training    PT Next Visit Plan  Gait training to least resistive device/ LE strengthening.  Discuss MD f/u visit.      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 BKA stump complication ([place]) 73/71/0626  . 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 S/P BKA (below knee amputation) unilateral, left ([place]) 02/01/2017  . Chronic diastolic heart failure ([place]) 01/21/2017  . Pressure injury of skin 01/18/2017  . 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 Diabetic foot ulcer ([place]) 12/29/2016  . Ataxia 10/21/2016  . History of femoral angiogram 09/06/2016  . Left leg pain 09/06/2016  . Leukocytosis 09/06/2016  . Generalized weakness 09/06/2016  . A-fib ([place]) 08/30/2016  . Pneumonia 05/19/2016  . Hyperlipidemia 04/20/2016  . Back pain 04/19/2016  . Type 2 diabetes mellitus treated with insulin ([place]) 04/19/2016  . Essential hypertension 04/19/2016  . PAD (peripheral artery disease) ([place]) 04/19/2016  . Erectile dysfunction following radical prostatectomy 06/25/2015  . History of prostate cancer 12/23/2014  . Incontinence 12/23/2014   Pura Spice, PT, DPT # [phone] 08/31/2017, 1:38 PM  [name] [facility] [address] [place], [place], [address] Phone: [phone]   Fax:  [phone]  Name: [name]  MRN: [id number] Date of Birth: 01/06/1935

## 2017-09-01 ENCOUNTER — Ambulatory Visit: Payer: Medicare Other | Admitting: Physical Therapy

## 2017-09-01 DIAGNOSIS — R269 Unspecified abnormalities of gait and mobility: Secondary | ICD-10-CM

## 2017-09-01 DIAGNOSIS — M6281 Muscle weakness (generalized): Secondary | ICD-10-CM

## 2017-09-01 DIAGNOSIS — S88112A Complete traumatic amputation at level between knee and ankle, left lower leg, initial encounter: Secondary | ICD-10-CM

## 2017-09-02 ENCOUNTER — Encounter: Payer: Self-pay | Admitting: Physical Therapy

## 2017-09-03 NOTE — Therapy (Signed)
[facility] [address] [place], [place], [address] Phone: [phone]   Fax:  [phone]  Physical Therapy Treatment  Patient Details  Name: [name] MRN: [id number] Date of Birth: 12-06-1934 Referring Provider: Dr. Leotis Pain   Encounter Date: 09/01/2017  PT End of Session - 09/03/17 0921    Visit Number  19    Number of Visits  25    Date for PT Re-Evaluation  09/22/17    PT Start Time  1024    PT Stop Time  1125    PT Time Calculation (min)  61 min    Equipment Utilized During Treatment  Gait belt    Activity Tolerance  Patient tolerated treatment well    Behavior During Therapy  [facility] for tasks assessed/performed       Past Medical History:  Diagnosis Date  . Arthritis   . Atrial fibrillation ([place])   . Carcinoma of prostate ([place])   . CHF (congestive heart failure) ([place])   . Chronic kidney disease   . Diabetes mellitus without complication ([place])   . ED (erectile dysfunction)   . Frequent urination   . Hyperlipidemia   . Hypertension   . Moderate mitral insufficiency   . Peripheral vascular disease ([place])   . Pneumonia 04/2016  . Prostate cancer ([place])   . Sleep apnea    OSA--USE C-PAP  . Ulcer of left foot due to type 2 diabetes mellitus ([place])   . Urinary stress incontinence, male     Past Surgical History:  Procedure Laterality Date  . AMPUTATION Left 01/12/2017   Procedure: AMPUTATION BELOW KNEE;  Surgeon: Algernon Huxley, MD;  Location: [facility];  Service: General;  Laterality: Left;  . APLIGRAFT PLACEMENT Left 10/15/2016   Procedure: APLIGRAFT PLACEMENT;  Surgeon: Albertine Patricia, DPM;  Location: [facility];  Service: Podiatry;  Laterality: Left;  necrotic ulcer  . CHOLECYSTECTOMY    . EYE SURGERY Bilateral    Cataract Extraction with IOL  . IRRIGATION AND DEBRIDEMENT FOOT Left 10/15/2016   Procedure: IRRIGATION AND DEBRIDEMENT FOOT-EXCISIONAL DEBRIDEMENT OF SKIN 3RD, 4TH AND 5TH TOES WITH APPLICATION OF  APLIGRAFT;  Surgeon: Albertine Patricia, DPM;  Location: [facility];  Service: Podiatry;  Laterality: Left;  necrotic,gangrene  . IRRIGATION AND DEBRIDEMENT FOOT Left 12/09/2016   Procedure: IRRIGATION AND DEBRIDEMENT FOOT-MUSCLE/FASCIA, RESECTION OF FOURTH AND FIFTH METATARSAL NECROTIC BONE;  Surgeon: Albertine Patricia, DPM;  Location: [facility];  Service: Podiatry;  Laterality: Left;  . LOWER EXTREMITY ANGIOGRAPHY Left 08/16/2016   Procedure: Lower Extremity Angiography;  Surgeon: Algernon Huxley, MD;  Location: [place] [facility];  Service: Cardiovascular;  Laterality: Left;  . LOWER EXTREMITY ANGIOGRAPHY Left 09/01/2016   Procedure: Lower Extremity Angiography;  Surgeon: Algernon Huxley, MD;  Location: [place] [facility];  Service: Cardiovascular;  Laterality: Left;  . LOWER EXTREMITY ANGIOGRAPHY Left 10/14/2016   Procedure: Lower Extremity Angiography;  Surgeon: Algernon Huxley, MD;  Location: [place] [facility];  Service: Cardiovascular;  Laterality: Left;  . LOWER EXTREMITY ANGIOGRAPHY Left 12/20/2016   Procedure: Lower Extremity Angiography;  Surgeon: Algernon Huxley, MD;  Location: [place] [facility];  Service: Cardiovascular;  Laterality: Left;  . LOWER EXTREMITY INTERVENTION  09/01/2016   Procedure: Lower Extremity Intervention;  Surgeon: Algernon Huxley, MD;  Location: [place] [facility];  Service: Cardiovascular;;  . PROSTATE SURGERY     removal  . TONSILLECTOMY     as a child  There were no vitals filed for this visit.  Subjective Assessment - 09/03/17 0919    Subjective  Pt. upset that he did not see Dr. Lucky Cowboy after last PT appt. secondary to MD cancelled appt. and told pt. to see Prosthetist.  Pt. is scheduled to see Staci Righter tomorrow in [place].  PT reexamined L distal residual limb and no changes to skin irritiation.      Patient is accompained by:  Family member    Limitations  Lifting;Standing;Walking;House hold activities    Patient Stated Goals  improve walking/ standing tolerance and  safety.      Currently in Pain?  No/denies         There.ex:  ScifitL610 min B UE/LE (warm up/no charge). Standing hip flexion/ abd./ extension/ knee flexion 20x each (min. To noUE assist). Seated LAQ with static hamstring stretches (pt. Lacking L knee extension). Sit to stands from green chair 10x (no UE assist). Step ups/ downs 10x2.  Unable to step up with L LE without UE assist (required handrails).   Neuro:  Airex step ups/ wt. Shifting/ NBOS (added EC)- CGA for safety in //-bars.   Posture correction with mirror feedback in standing.    Gait:  Amb. In hallwayworking on consistent step pattern, esp. R swing through phase of gait.  Ambulate outside on varying terrain (ramps/ grass) with use of SPC working on maintaining BOS/ consistent hip flexion/ step pattern.  Pt. Tends look down due to fear of falling.       PT Long Term Goals - 08/31/17 1327      PT LONG TERM GOAL #1   Title  Pt. able to transfer from w/c to least assistive device with mod. I and use of prosthesis safety.      Baseline  mod. I with use of RW or SPC    Time  4    Period  Weeks    Status  Achieved    Target Date  07/26/17      PT LONG TERM GOAL #2   Title  Pt. will increase FOTO to 49 to improve functional mobility.      Baseline  FOTO on 2/6: 34.  3/4: 49    Time  4    Period  Weeks    Status  Achieved    Target Date  07/26/17      PT LONG TERM GOAL #3   Title  Pt. able to don/ doff L prosthesis with mod. I prior to PT tx. session to promote ability to walk to car/ ambulate into PT clinic.      Baseline  mod. I for extra time.     Time  4    Period  Weeks    Status  Achieved    Target Date  07/26/17      PT LONG TERM GOAL #4   Title  Pt. able to ambulate 300 feet with use of least assistive device and prosthesis to improve safe functional mobility.      Baseline  450 feet with use of SPC around PT building/ complex with mod. I with good step pattern/ cadence.     Time  4     Period  Weeks    Status  Achieved    Target Date  08/25/17      PT LONG TERM GOAL #5   Title  Pt. able to amb. up/down ramp at home with mod. I and use of prosthesis safely.  Baseline  mod independent with use of SPC and ramp at home.     Time  4    Period  Weeks    Status  Achieved    Target Date  08/25/17      Additional Long Term Goals   Additional Long Term Goals  Yes      PT LONG TERM GOAL #6   Title  Pt. able to amb. community distance with use of SPC on level surfaces safety with no LOB.     Baseline  pt. able to amb. with SPC and SBA/mod. I for safety outside.  Pt. will use scooter at [employer] to avoid walking due to being fearful of falling.     Time  4    Period  Weeks    Status  Partially Met    Target Date  09/22/17      PT LONG TERM GOAL #7   Title  Pt. able to stand from low chair/ commode with no UE assist safely.      Baseline  Extra time/ several attempts to stand from low chair with use of prosthesis.      Time  4    Period  Weeks    Status  New    Target Date  09/22/17      PT LONG TERM GOAL #8   Title  Pt. will be able to ambulate in yard/ uneven terrain with use of least assistive device and no LOB safely to allow pt. to complete yard work/ mowing.     Baseline  Pt. using SPC to ambulate to riding mower.  Pt. hesitant to ambulate around yard due to fear of falling.      Time  4    Period  Weeks    Status  New    Target Date  09/22/17            Plan - 09/03/17 0921    Clinical Impression Statement  Pts. rash on distal aspect of residual limb remains.  Pt. scheduled to see Prosthetist tomorrow.  Pt. continues to impress with gait independence on level surfaces without use of SPC.  Pt. benefit from [facility] with outside walking and cuing to correct posture/ step pattern/ BOS.  No c/o L residual limb pain during therex. and prosthesis is fitting well.  CGA with static balance tasks on Airex in //-bars (EO and EC).  Independent with sit to stand from green  chairs in PT clinic.      Clinical Presentation  Evolving    Clinical Decision Making  Moderate    Rehab Potential  Good    PT Frequency  2x / week    PT Duration  4 weeks    PT Treatment/Interventions  ADLs/Self Care Home Management;Therapeutic activities;Functional mobility training;Stair training;Gait training;Therapeutic exercise;Balance training;Neuromuscular re-education;Patient/family education;Manual techniques;Scar mobilization;Passive range of motion;Prosthetic Training    PT Next Visit Plan  Gait training to least resistive device/ LE strengthening.  Reassess rash on distal aspect of residual limb.     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 BKA stump complication ([place]) 97/94/8016  . 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 S/P BKA (below knee amputation) unilateral, left ([place]) 02/01/2017  . Chronic diastolic heart failure ([place]) 01/21/2017  . Pressure injury of skin 01/18/2017  . 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 Diabetic foot ulcer ([place]) 12/29/2016  . Ataxia 10/21/2016  . History of femoral angiogram 09/06/2016  . Left leg pain 09/06/2016  . Leukocytosis 09/06/2016  . Generalized weakness 09/06/2016  . A-fib ([place]) 08/30/2016  . Pneumonia  05/19/2016  . Hyperlipidemia 04/20/2016  . Back pain 04/19/2016  . Type 2 diabetes mellitus treated with insulin ([place]) 04/19/2016  . Essential hypertension 04/19/2016  . PAD (peripheral artery disease) ([place]) 04/19/2016  . Erectile dysfunction following radical prostatectomy 06/25/2015  . History of prostate cancer 12/23/2014  . Incontinence 12/23/2014   Pura Spice, PT, DPT # [phone] 09/03/2017, 9:25 AM  [place] [facility] [facility] [address] [place], [place], [address] Phone: [phone]   Fax:  [phone]  Name: CAROLE DEERE MRN: [id number] Date of Birth: Jun 16, 1934

## 2017-09-05 ENCOUNTER — Encounter: Payer: Self-pay | Admitting: Physical Therapy

## 2017-09-05 ENCOUNTER — Ambulatory Visit: Payer: Medicare Other | Admitting: Physical Therapy

## 2017-09-05 DIAGNOSIS — M6281 Muscle weakness (generalized): Secondary | ICD-10-CM | POA: Diagnosis not present

## 2017-09-05 DIAGNOSIS — R269 Unspecified abnormalities of gait and mobility: Secondary | ICD-10-CM

## 2017-09-05 DIAGNOSIS — S88112A Complete traumatic amputation at level between knee and ankle, left lower leg, initial encounter: Secondary | ICD-10-CM

## 2017-09-05 NOTE — Therapy (Signed)
[facility] [address] [place], [place], [address] Phone: [phone]   Fax:  [phone]  Physical Therapy Treatment  Patient Details  Name: [name] MRN: [id number] Date of Birth: 28-Jul-1934 Referring Provider: Dr. Leotis Pain   Encounter Date: 09/05/2017  PT End of Session - 09/05/17 0902    Visit Number  20    Number of Visits  25    Date for PT Re-Evaluation  09/22/17    PT Start Time  0900    PT Stop Time  0945    PT Time Calculation (min)  45 min    Equipment Utilized During Treatment  Gait belt    Activity Tolerance  Patient tolerated treatment well    Behavior During Therapy  [facility] for tasks assessed/performed       Past Medical History:  Diagnosis Date  . Arthritis   . Atrial fibrillation ([place])   . Carcinoma of prostate ([place])   . CHF (congestive heart failure) ([place])   . Chronic kidney disease   . Diabetes mellitus without complication ([place])   . ED (erectile dysfunction)   . Frequent urination   . Hyperlipidemia   . Hypertension   . Moderate mitral insufficiency   . Peripheral vascular disease ([place])   . Pneumonia 04/2016  . Prostate cancer ([place])   . Sleep apnea    OSA--USE C-PAP  . Ulcer of left foot due to type 2 diabetes mellitus ([place])   . Urinary stress incontinence, male     Past Surgical History:  Procedure Laterality Date  . AMPUTATION Left 01/12/2017   Procedure: AMPUTATION BELOW KNEE;  Surgeon: Algernon Huxley, MD;  Location: [facility];  Service: General;  Laterality: Left;  . APLIGRAFT PLACEMENT Left 10/15/2016   Procedure: APLIGRAFT PLACEMENT;  Surgeon: Albertine Patricia, DPM;  Location: [facility];  Service: Podiatry;  Laterality: Left;  necrotic ulcer  . CHOLECYSTECTOMY    . EYE SURGERY Bilateral    Cataract Extraction with IOL  . IRRIGATION AND DEBRIDEMENT FOOT Left 10/15/2016   Procedure: IRRIGATION AND DEBRIDEMENT FOOT-EXCISIONAL DEBRIDEMENT OF SKIN 3RD, 4TH AND 5TH TOES WITH APPLICATION OF  APLIGRAFT;  Surgeon: Albertine Patricia, DPM;  Location: [facility];  Service: Podiatry;  Laterality: Left;  necrotic,gangrene  . IRRIGATION AND DEBRIDEMENT FOOT Left 12/09/2016   Procedure: IRRIGATION AND DEBRIDEMENT FOOT-MUSCLE/FASCIA, RESECTION OF FOURTH AND FIFTH METATARSAL NECROTIC BONE;  Surgeon: Albertine Patricia, DPM;  Location: [facility];  Service: Podiatry;  Laterality: Left;  . LOWER EXTREMITY ANGIOGRAPHY Left 08/16/2016   Procedure: Lower Extremity Angiography;  Surgeon: Algernon Huxley, MD;  Location: [place] [facility];  Service: Cardiovascular;  Laterality: Left;  . LOWER EXTREMITY ANGIOGRAPHY Left 09/01/2016   Procedure: Lower Extremity Angiography;  Surgeon: Algernon Huxley, MD;  Location: [place] [facility];  Service: Cardiovascular;  Laterality: Left;  . LOWER EXTREMITY ANGIOGRAPHY Left 10/14/2016   Procedure: Lower Extremity Angiography;  Surgeon: Algernon Huxley, MD;  Location: [place] [facility];  Service: Cardiovascular;  Laterality: Left;  . LOWER EXTREMITY ANGIOGRAPHY Left 12/20/2016   Procedure: Lower Extremity Angiography;  Surgeon: Algernon Huxley, MD;  Location: [place] [facility];  Service: Cardiovascular;  Laterality: Left;  . LOWER EXTREMITY INTERVENTION  09/01/2016   Procedure: Lower Extremity Intervention;  Surgeon: Algernon Huxley, MD;  Location: [place] [facility];  Service: Cardiovascular;;  . PROSTATE SURGERY     removal  . TONSILLECTOMY     as a child  There were no vitals filed for this visit.  Subjective Assessment - 09/05/17 0902    Subjective  Pt says he still can't carry things around the house, weed eating, mowing.  Pt says he went to go see Staci Righter who made his prosthesis more comfortable.  Staci Righter readjusted screws distally and he also says his rash is due to moisture and told the pt that it will heal with time.  He instructed pt to keep this site dry.     Patient is accompained by:  Family member    Limitations  Lifting;Standing;Walking;House hold activities     Patient Stated Goals  improve walking/ standing tolerance and safety.      Currently in Pain?  Yes    Pain Score  6     Pain Location  Leg    Pain Orientation  Left    Pain Descriptors / Indicators  Aching    Pain Type  Chronic pain    Pain Frequency  Constant          TREATMENT   Pt ambulating in gym with SPC and carrying 5# dumbbell with cues for forward gaze and upright posture.   Ambulating in agility ladder with 1 foot for each step to promote equal step length.  Standing hip flexion/ abd./ extension/ knee flexion 20x each (min. To no?UE assist).? ?  Seated LAQ with 5 second holds x10.   Sit to stands from green chair 10x (no UE assist).   Supine knee presses with manual overpressure for hamstring stretch with 10 second holds x10   L SLS and tapping different colored dumbbells with the RLE. Pt initially with RUE support and transitioned to fingertip support. Challenging for the pt.   Ambulating in gym and stepping up and over airex pads                       PT Education - 09/05/17 0902    Education provided  Yes    Education Details  Exercise technique    Person(s) Educated  Patient    Methods  Explanation;Demonstration;Verbal cues    Comprehension  Verbalized understanding;Returned demonstration;Verbal cues required;Need further instruction          PT Long Term Goals - 08/31/17 1327      PT LONG TERM GOAL #1   Title  Pt. able to transfer from w/c to least assistive device with mod. I and use of prosthesis safety.      Baseline  mod. I with use of RW or SPC    Time  4    Period  Weeks    Status  Achieved    Target Date  07/26/17      PT LONG TERM GOAL #2   Title  Pt. will increase FOTO to 49 to improve functional mobility.      Baseline  FOTO on 2/6: 34.  3/4: 49    Time  4    Period  Weeks    Status  Achieved    Target Date  07/26/17      PT LONG TERM GOAL #3   Title  Pt. able to don/ doff L prosthesis with mod. I prior to  PT tx. session to promote ability to walk to car/ ambulate into PT clinic.      Baseline  mod. I for extra time.     Time  4    Period  Weeks    Status  Achieved  Target Date  07/26/17      PT LONG TERM GOAL #4   Title  Pt. able to ambulate 300 feet with use of least assistive device and prosthesis to improve safe functional mobility.      Baseline  450 feet with use of SPC around PT building/ complex with mod. I with good step pattern/ cadence.     Time  4    Period  Weeks    Status  Achieved    Target Date  08/25/17      PT LONG TERM GOAL #5   Title  Pt. able to amb. up/down ramp at home with mod. I and use of prosthesis safely.      Baseline  mod independent with use of SPC and ramp at home.     Time  4    Period  Weeks    Status  Achieved    Target Date  08/25/17      Additional Long Term Goals   Additional Long Term Goals  Yes      PT LONG TERM GOAL #6   Title  Pt. able to amb. community distance with use of SPC on level surfaces safety with no LOB.     Baseline  pt. able to amb. with SPC and SBA/mod. I for safety outside.  Pt. will use scooter at [employer] to avoid walking due to being fearful of falling.     Time  4    Period  Weeks    Status  Partially Met    Target Date  09/22/17      PT LONG TERM GOAL #7   Title  Pt. able to stand from low chair/ commode with no UE assist safely.      Baseline  Extra time/ several attempts to stand from low chair with use of prosthesis.      Time  4    Period  Weeks    Status  New    Target Date  09/22/17      PT LONG TERM GOAL #8   Title  Pt. will be able to ambulate in yard/ uneven terrain with use of least assistive device and no LOB safely to allow pt. to complete yard work/ mowing.     Baseline  Pt. using SPC to ambulate to riding mower.  Pt. hesitant to ambulate around yard due to fear of falling.      Time  4    Period  Weeks    Status  New    Target Date  09/22/17            Plan - 09/05/17 0907    Clinical  Impression Statement  Pt requires cues for sufficient anterior lean and to bend Bil knees in preparation for sit<>stand this session.  Utilized agility ladder to promote equal step length Bil.  Completed L knee presses with manual overpressure for stretch of hamstring due to observed decreased L knee E when ambulating.  Practiced ambulating with light weight in gym due to report of difficulty carrying items around home.  Cues for proper gait mechanics. Pt will benefit from continued skilled PT interventions for improved strength, gait mechanics, balance, and QOL.     Rehab Potential  Good    PT Frequency  2x / week    PT Duration  4 weeks    PT Treatment/Interventions  ADLs/Self Care Home Management;Therapeutic activities;Functional mobility training;Stair training;Gait training;Therapeutic exercise;Balance training;Neuromuscular re-education;Patient/family education;Manual techniques;Scar mobilization;Passive range of motion;Prosthetic Training  PT Next Visit Plan  Gait training to least resistive device/ LE strengthening.  Reassess rash on distal aspect of residual limb.     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 BKA stump complication ([place]) 34/19/6222  . 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 S/P  BKA (below knee amputation) unilateral, left ([place]) 02/01/2017  . Chronic diastolic heart failure ([place]) 01/21/2017  . Pressure injury of skin 01/18/2017  . 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 Diabetic foot ulcer ([place]) 12/29/2016  . Ataxia 10/21/2016  . History of femoral angiogram 09/06/2016  . Left leg pain 09/06/2016  . Leukocytosis 09/06/2016  . Generalized weakness 09/06/2016  . A-fib ([place]) 08/30/2016  . Pneumonia 05/19/2016  . Hyperlipidemia 04/20/2016  . Back pain 04/19/2016  . Type 2 diabetes mellitus treated with insulin ([place]) 04/19/2016  . Essential hypertension 04/19/2016  . PAD (peripheral artery disease) ([place]) 04/19/2016  . Erectile dysfunction following radical prostatectomy 06/25/2015  . History of prostate cancer 12/23/2014  . Incontinence 12/23/2014    Collie Siad PT, DPT 09/05/2017, 9:49 AM   [facility] [address] [place], [place], [address] Phone: [phone]   Fax:  [phone]  Name: MIRAN KAUTZMAN MRN: [id number] Date of Birth: Oct 05, 1934

## 2017-09-06 ENCOUNTER — Encounter: Payer: Self-pay | Admitting: Urology

## 2017-09-07 ENCOUNTER — Ambulatory Visit: Payer: Medicare Other | Admitting: Physical Therapy

## 2017-09-07 ENCOUNTER — Encounter: Payer: Self-pay | Admitting: Physical Therapy

## 2017-09-07 DIAGNOSIS — S88112A Complete traumatic amputation at level between knee and ankle, left lower leg, initial encounter: Secondary | ICD-10-CM

## 2017-09-07 DIAGNOSIS — M6281 Muscle weakness (generalized): Secondary | ICD-10-CM | POA: Diagnosis not present

## 2017-09-07 DIAGNOSIS — R269 Unspecified abnormalities of gait and mobility: Secondary | ICD-10-CM

## 2017-09-07 NOTE — Therapy (Signed)
[facility] [address] [place], [place], [address] Phone: [phone]   Fax:  [phone]  Physical Therapy Treatment  Patient Details  Name: [name] MRN: [id number] Date of Birth: [birth year] Referring Provider: Dr. Leotis Pain   Encounter Date: 09/07/2017  PT End of Session - 09/07/17 0906    Visit Number  21    Number of Visits  25    Date for PT Re-Evaluation  09/22/17    PT Start Time  0902    PT Stop Time  0943    PT Time Calculation (min)  41 min    Equipment Utilized During Treatment  Gait belt    Activity Tolerance  Patient tolerated treatment well    Behavior During Therapy  [facility] for tasks assessed/performed       Past Medical History:  Diagnosis Date  . Arthritis   . Atrial fibrillation ([place])   . Carcinoma of prostate ([place])   . CHF (congestive heart failure) ([place])   . Chronic kidney disease   . Diabetes mellitus without complication ([place])   . ED (erectile dysfunction)   . Frequent urination   . Hyperlipidemia   . Hypertension   . Moderate mitral insufficiency   . Peripheral vascular disease ([place])   . Pneumonia 04/2016  . Prostate cancer ([place])   . Sleep apnea    OSA--USE C-PAP  . Ulcer of left foot due to type 2 diabetes mellitus ([place])   . Urinary stress incontinence, male     Past Surgical History:  Procedure Laterality Date  . AMPUTATION Left 01/12/2017   Procedure: AMPUTATION BELOW KNEE;  Surgeon: Algernon Huxley, MD;  Location: [facility];  Service: General;  Laterality: Left;  . APLIGRAFT PLACEMENT Left 10/15/2016   Procedure: APLIGRAFT PLACEMENT;  Surgeon: Albertine Patricia, DPM;  Location: [facility];  Service: Podiatry;  Laterality: Left;  necrotic ulcer  . CHOLECYSTECTOMY    . EYE SURGERY Bilateral    Cataract Extraction with IOL  . IRRIGATION AND DEBRIDEMENT FOOT Left 10/15/2016   Procedure: IRRIGATION AND DEBRIDEMENT FOOT-EXCISIONAL DEBRIDEMENT OF SKIN 3RD, 4TH AND 5TH TOES WITH APPLICATION OF  APLIGRAFT;  Surgeon: Albertine Patricia, DPM;  Location: [facility];  Service: Podiatry;  Laterality: Left;  necrotic,gangrene  . IRRIGATION AND DEBRIDEMENT FOOT Left 12/09/2016   Procedure: IRRIGATION AND DEBRIDEMENT FOOT-MUSCLE/FASCIA, RESECTION OF FOURTH AND FIFTH METATARSAL NECROTIC BONE;  Surgeon: Albertine Patricia, DPM;  Location: [facility];  Service: Podiatry;  Laterality: Left;  . LOWER EXTREMITY ANGIOGRAPHY Left 08/16/2016   Procedure: Lower Extremity Angiography;  Surgeon: Algernon Huxley, MD;  Location: [place] [facility];  Service: Cardiovascular;  Laterality: Left;  . LOWER EXTREMITY ANGIOGRAPHY Left 09/01/2016   Procedure: Lower Extremity Angiography;  Surgeon: Algernon Huxley, MD;  Location: [place] [facility];  Service: Cardiovascular;  Laterality: Left;  . LOWER EXTREMITY ANGIOGRAPHY Left 10/14/2016   Procedure: Lower Extremity Angiography;  Surgeon: Algernon Huxley, MD;  Location: [place] [facility];  Service: Cardiovascular;  Laterality: Left;  . LOWER EXTREMITY ANGIOGRAPHY Left 12/20/2016   Procedure: Lower Extremity Angiography;  Surgeon: Algernon Huxley, MD;  Location: [place] [facility];  Service: Cardiovascular;  Laterality: Left;  . LOWER EXTREMITY INTERVENTION  09/01/2016   Procedure: Lower Extremity Intervention;  Surgeon: Algernon Huxley, MD;  Location: [place] [facility];  Service: Cardiovascular;;  . PROSTATE SURGERY     removal  . TONSILLECTOMY     as a child  There were no vitals filed for this visit.  Subjective Assessment - 09/07/17 0906    Subjective  Pt is doing well today. He has been working on keeping his foot up when ambulating. Pt attempted to use the push mower yesterday afternoon but he got shaky so he used his riding mower.      Patient is accompained by:  Family member    Limitations  Lifting;Standing;Walking;House hold activities    Patient Stated Goals  improve walking/ standing tolerance and safety.      Currently in Pain?  No/denies       TREATMENT    Ambulating in agility ladder with 1 foot for each step to promote equal step length.   Ambulating outside on sidewalk and in grass with cues for equal step length (pt using SPC). Pt requires min assist for safety. ?   Supine knee presses with manual overpressure for hamstring stretch with 10 second holds x10   Seated LAQ with 5 second holds x10.   Sit to stands from green chair 10x. Pt requires cues to scoot to edge of seat, sliding feet back, and leaning anteriorly. Pt pushing through thighs to boost.   Standing hip flexion/ abd./ extension/ knee flexion 20x each (min to no UE assist).?   L SLS and tapping different colored dumbbells with the RLE. Pt initially with RUE support and transitioned to fingertip support. Challenging for the pt.                          PT Education - 09/07/17 0905    Education provided  Yes    Education Details  Exercise technique; instruction to not use push mower or weed eater due to safety concerns    Person(s) Educated  Patient    Methods  Explanation;Demonstration;Verbal cues    Comprehension  Verbalized understanding;Returned demonstration;Verbal cues required;Need further instruction          PT Long Term Goals - 08/31/17 1327      PT LONG TERM GOAL #1   Title  Pt. able to transfer from w/c to least assistive device with mod. I and use of prosthesis safety.      Baseline  mod. I with use of RW or SPC    Time  4    Period  Weeks    Status  Achieved    Target Date  07/26/17      PT LONG TERM GOAL #2   Title  Pt. will increase FOTO to 49 to improve functional mobility.      Baseline  FOTO on 2/6: 34.  3/4: 49    Time  4    Period  Weeks    Status  Achieved    Target Date  07/26/17      PT LONG TERM GOAL #3   Title  Pt. able to don/ doff L prosthesis with mod. I prior to PT tx. session to promote ability to walk to car/ ambulate into PT clinic.      Baseline  mod. I for extra time.     Time  4    Period  Weeks     Status  Achieved    Target Date  07/26/17      PT LONG TERM GOAL #4   Title  Pt. able to ambulate 300 feet with use of least assistive device and prosthesis to improve safe functional mobility.      Baseline  450 feet  with use of SPC around PT building/ complex with mod. I with good step pattern/ cadence.     Time  4    Period  Weeks    Status  Achieved    Target Date  08/25/17      PT LONG TERM GOAL #5   Title  Pt. able to amb. up/down ramp at home with mod. I and use of prosthesis safely.      Baseline  mod independent with use of SPC and ramp at home.     Time  4    Period  Weeks    Status  Achieved    Target Date  08/25/17      Additional Long Term Goals   Additional Long Term Goals  Yes      PT LONG TERM GOAL #6   Title  Pt. able to amb. community distance with use of SPC on level surfaces safety with no LOB.     Baseline  pt. able to amb. with SPC and SBA/mod. I for safety outside.  Pt. will use scooter at [employer] to avoid walking due to being fearful of falling.     Time  4    Period  Weeks    Status  Partially Met    Target Date  09/22/17      PT LONG TERM GOAL #7   Title  Pt. able to stand from low chair/ commode with no UE assist safely.      Baseline  Extra time/ several attempts to stand from low chair with use of prosthesis.      Time  4    Period  Weeks    Status  New    Target Date  09/22/17      PT LONG TERM GOAL #8   Title  Pt. will be able to ambulate in yard/ uneven terrain with use of least assistive device and no LOB safely to allow pt. to complete yard work/ mowing.     Baseline  Pt. using SPC to ambulate to riding mower.  Pt. hesitant to ambulate around yard due to fear of falling.      Time  4    Period  Weeks    Status  New    Target Date  09/22/17            Plan - 09/07/17 0908    Clinical Impression Statement  Continued efforts to promote equal step length with use of agility ladder followed by ambulating outdoors on even and uneven  surfaces for carryover.  Pt requires verbal cues for greater step with LLE and min assist for safety.  Pt initially losing his balance posteriorly with sit>stand.  Improved technique with cues to scoot to edge of seat, slide feet back, and lean anteriorly.  Pt will benefit from continued skilled PT interventions for improved gait mechanics, strength, and independence.     Rehab Potential  Good    PT Frequency  2x / week    PT Duration  4 weeks    PT Treatment/Interventions  ADLs/Self Care Home Management;Therapeutic activities;Functional mobility training;Stair training;Gait training;Therapeutic exercise;Balance training;Neuromuscular re-education;Patient/family education;Manual techniques;Scar mobilization;Passive range of motion;Prosthetic Training    PT Next Visit Plan  Gait training to least resistive device/ LE strengthening.  Reassess rash on distal aspect of residual limb.     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 BKA stump complication ([place]) 47/11/6149  . 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 S/P BKA (below knee amputation) unilateral, left ([place]) 02/01/2017  . Chronic diastolic heart failure ([place]) 01/21/2017  . Pressure injury of skin 01/18/2017  . Atherosclerosis of artery of extremity with  gangrene ([place]) 01/05/2017  . Diabetic foot ulcer ([place]) 12/29/2016  . Ataxia 10/21/2016  . History of femoral angiogram 09/06/2016  . Left leg pain 09/06/2016  . Leukocytosis 09/06/2016  . Generalized weakness 09/06/2016  . A-fib ([place]) 08/30/2016  . Pneumonia 05/19/2016  . Hyperlipidemia 04/20/2016  . Back pain 04/19/2016  . Type 2 diabetes mellitus treated with insulin ([place]) 04/19/2016  . Essential hypertension 04/19/2016  . PAD (peripheral artery disease) ([place]) 04/19/2016  . Erectile dysfunction following radical prostatectomy 06/25/2015  . History of prostate cancer 12/23/2014  . Incontinence 12/23/2014    Collie Siad PT, DPT 09/07/2017, 9:44 AM  [facility] [address] [place], [place], [address] Phone: [phone]   Fax:  [phone]  Name: [name] MRN: [id number] Date of Birth: 09-11-1934

## 2017-09-12 ENCOUNTER — Ambulatory Visit: Payer: Medicare Other | Admitting: Physical Therapy

## 2017-09-12 ENCOUNTER — Encounter: Payer: Self-pay | Admitting: Physical Therapy

## 2017-09-12 DIAGNOSIS — S88112A Complete traumatic amputation at level between knee and ankle, left lower leg, initial encounter: Secondary | ICD-10-CM

## 2017-09-12 DIAGNOSIS — M6281 Muscle weakness (generalized): Secondary | ICD-10-CM

## 2017-09-12 DIAGNOSIS — R269 Unspecified abnormalities of gait and mobility: Secondary | ICD-10-CM

## 2017-09-12 NOTE — Therapy (Signed)
[facility] [address]. [place], [place], [address] Phone: [phone]   Fax:  [phone]  Physical Therapy Treatment  Patient Details  Name: [name] MRN: [id number] Date of Birth: [birth year] Referring Provider: Dr. Leotis Pain   Encounter Date: 09/12/2017  PT End of Session - 09/12/17 0903    Visit Number  22    Number of Visits  25    Date for PT Re-Evaluation  09/22/17    PT Start Time  0900    PT Stop Time  0941    PT Time Calculation (min)  41 min    Equipment Utilized During Treatment  Gait belt    Activity Tolerance  Patient tolerated treatment well    Behavior During Therapy  [facility] for tasks assessed/performed       Past Medical History:  Diagnosis Date  . Arthritis   . Atrial fibrillation ([place])   . Carcinoma of prostate ([place])   . CHF (congestive heart failure) ([place])   . Chronic kidney disease   . Diabetes mellitus without complication ([place])   . ED (erectile dysfunction)   . Frequent urination   . Hyperlipidemia   . Hypertension   . Moderate mitral insufficiency   . Peripheral vascular disease ([place])   . Pneumonia 04/2016  . Prostate cancer ([place])   . Sleep apnea    OSA--USE C-PAP  . Ulcer of left foot due to type 2 diabetes mellitus ([place])   . Urinary stress incontinence, male     Past Surgical History:  Procedure Laterality Date  . AMPUTATION Left 01/12/2017   Procedure: AMPUTATION BELOW KNEE;  Surgeon: Algernon Huxley, MD;  Location: [facility];  Service: General;  Laterality: Left;  . APLIGRAFT PLACEMENT Left 10/15/2016   Procedure: APLIGRAFT PLACEMENT;  Surgeon: Albertine Patricia, DPM;  Location: [facility];  Service: Podiatry;  Laterality: Left;  necrotic ulcer  . CHOLECYSTECTOMY    . EYE SURGERY Bilateral    Cataract Extraction with IOL  . IRRIGATION AND DEBRIDEMENT FOOT Left 10/15/2016   Procedure: IRRIGATION AND DEBRIDEMENT FOOT-EXCISIONAL DEBRIDEMENT OF SKIN 3RD, 4TH AND 5TH TOES WITH APPLICATION OF  APLIGRAFT;  Surgeon: Albertine Patricia, DPM;  Location: [facility];  Service: Podiatry;  Laterality: Left;  necrotic,gangrene  . IRRIGATION AND DEBRIDEMENT FOOT Left 12/09/2016   Procedure: IRRIGATION AND DEBRIDEMENT FOOT-MUSCLE/FASCIA, RESECTION OF FOURTH AND FIFTH METATARSAL NECROTIC BONE;  Surgeon: Albertine Patricia, DPM;  Location: [facility];  Service: Podiatry;  Laterality: Left;  . LOWER EXTREMITY ANGIOGRAPHY Left 08/16/2016   Procedure: Lower Extremity Angiography;  Surgeon: Algernon Huxley, MD;  Location: [place] [facility];  Service: Cardiovascular;  Laterality: Left;  . LOWER EXTREMITY ANGIOGRAPHY Left 09/01/2016   Procedure: Lower Extremity Angiography;  Surgeon: Algernon Huxley, MD;  Location: [place] [facility];  Service: Cardiovascular;  Laterality: Left;  . LOWER EXTREMITY ANGIOGRAPHY Left 10/14/2016   Procedure: Lower Extremity Angiography;  Surgeon: Algernon Huxley, MD;  Location: [place] [facility];  Service: Cardiovascular;  Laterality: Left;  . LOWER EXTREMITY ANGIOGRAPHY Left 12/20/2016   Procedure: Lower Extremity Angiography;  Surgeon: Algernon Huxley, MD;  Location: [place] [facility];  Service: Cardiovascular;  Laterality: Left;  . LOWER EXTREMITY INTERVENTION  09/01/2016   Procedure: Lower Extremity Intervention;  Surgeon: Algernon Huxley, MD;  Location: [place] [facility];  Service: Cardiovascular;;  . PROSTATE SURGERY     removal  . TONSILLECTOMY     as a child  There were no vitals filed for this visit.  Subjective Assessment - 09/12/17 0904    Subjective  Pt reports he went to an [place] egg hunt yesterday in the parking lot and pt says the distance was "rough" and pt had trouble keeping up.  Pt reports it is significantly harder to walk on grass.    Patient is accompained by:  Family member    Limitations  Lifting;Standing;Walking;House hold activities    Patient Stated Goals  improve walking/ standing tolerance and safety.      Currently in Pain?  No/denies          TREATMENT  Ambulating in agility ladder with 1 foot for each step to promote equal step length.  Ambulating outside on sidewalk and in grass with cues for equal step length (pt using SPC). Pt requires min assist for safety. Requires seated rest break after ambulating ~40 ft due to fatigue. Repeated x4.  Sit to stands from green chair 15x. Pt requires cues to scoot to edge of seat, sliding feet back, and leaning anteriorly. Pt pushing through thighs to boost.  L SLR x10 in supine  Supine knee presses with manual overpressure for hamstring stretch with 10 second holds x10  Seated LAQ with 5 second holds x10.  Ambulating in gym and stepping up and over airex pads                         PT Education - 09/12/17 0902    Education provided  Yes    Education Details  Exercise technique    Person(s) Educated  Patient    Methods  Explanation;Demonstration;Verbal cues    Comprehension  Verbalized understanding;Returned demonstration;Verbal cues required          PT Long Term Goals - 08/31/17 1327      PT LONG TERM GOAL #1   Title  Pt. able to transfer from w/c to least assistive device with mod. I and use of prosthesis safety.      Baseline  mod. I with use of RW or SPC    Time  4    Period  Weeks    Status  Achieved    Target Date  07/26/17      PT LONG TERM GOAL #2   Title  Pt. will increase FOTO to 49 to improve functional mobility.      Baseline  FOTO on 2/6: 34.  3/4: 49    Time  4    Period  Weeks    Status  Achieved    Target Date  07/26/17      PT LONG TERM GOAL #3   Title  Pt. able to don/ doff L prosthesis with mod. I prior to PT tx. session to promote ability to walk to car/ ambulate into PT clinic.      Baseline  mod. I for extra time.     Time  4    Period  Weeks    Status  Achieved    Target Date  07/26/17      PT LONG TERM GOAL #4   Title  Pt. able to ambulate 300 feet with use of least assistive device and prosthesis to improve safe  functional mobility.      Baseline  450 feet with use of SPC around PT building/ complex with mod. I with good step pattern/ cadence.     Time  4    Period  Weeks  Status  Achieved    Target Date  08/25/17      PT LONG TERM GOAL #5   Title  Pt. able to amb. up/down ramp at home with mod. I and use of prosthesis safely.      Baseline  mod independent with use of SPC and ramp at home.     Time  4    Period  Weeks    Status  Achieved    Target Date  08/25/17      Additional Long Term Goals   Additional Long Term Goals  Yes      PT LONG TERM GOAL #6   Title  Pt. able to amb. community distance with use of SPC on level surfaces safety with no LOB.     Baseline  pt. able to amb. with SPC and SBA/mod. I for safety outside.  Pt. will use scooter at [employer] to avoid walking due to being fearful of falling.     Time  4    Period  Weeks    Status  Partially Met    Target Date  09/22/17      PT LONG TERM GOAL #7   Title  Pt. able to stand from low chair/ commode with no UE assist safely.      Baseline  Extra time/ several attempts to stand from low chair with use of prosthesis.      Time  4    Period  Weeks    Status  New    Target Date  09/22/17      PT LONG TERM GOAL #8   Title  Pt. will be able to ambulate in yard/ uneven terrain with use of least assistive device and no LOB safely to allow pt. to complete yard work/ mowing.     Baseline  Pt. using SPC to ambulate to riding mower.  Pt. hesitant to ambulate around yard due to fear of falling.      Time  4    Period  Weeks    Status  New    Target Date  09/22/17            Plan - 09/12/17 0906    Clinical Impression Statement  Focused efforts on ambulatory endurance and ambulating on even and uneven surfaces.  Pt requires max verbal cues and demonstration for sit>stand and still had difficulty with this.  Pt will benefit from continued skilled PT interventions for improved gait mechanics, ambulatory endurance, and QOL.     Rehab Potential  Good    PT Frequency  2x / week    PT Duration  4 weeks    PT Treatment/Interventions  ADLs/Self Care Home Management;Therapeutic activities;Functional mobility training;Stair training;Gait training;Therapeutic exercise;Balance training;Neuromuscular re-education;Patient/family education;Manual techniques;Scar mobilization;Passive range of motion;Prosthetic Training    PT Next Visit Plan  Gait training to least resistive device/ LE strengthening.  Reassess rash on distal aspect of residual limb.     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 BKA stump complication ([place]) 84/16/6063  . 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 S/P BKA (below knee amputation) unilateral, left ([place]) 02/01/2017  . Chronic diastolic heart failure ([place]) 01/21/2017  . Pressure injury of skin 01/18/2017  . 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 Diabetic foot ulcer ([place]) 12/29/2016  . Ataxia 10/21/2016  . History of femoral angiogram 09/06/2016  . Left leg pain 09/06/2016  . Leukocytosis 09/06/2016  . Generalized weakness 09/06/2016  . A-fib ([place]) 08/30/2016  . Pneumonia 05/19/2016  . Hyperlipidemia 04/20/2016  . Back pain 04/19/2016  . Type 2 diabetes mellitus treated with  insulin ([place]) 04/19/2016  . Essential hypertension 04/19/2016  . PAD (peripheral artery disease) ([place]) 04/19/2016  . Erectile dysfunction following radical prostatectomy 06/25/2015  . History of prostate cancer 12/23/2014  . Incontinence 12/23/2014    Collie Siad PT, DPT 09/12/2017, 9:46 AM  [facility] [address] [place], [place], [address] Phone: [phone]   Fax:  [phone]  Name: JERMARCUS MCFADYEN MRN: [id number] Date of Birth: 1934/11/02

## 2017-09-14 ENCOUNTER — Ambulatory Visit: Payer: Medicare Other | Admitting: Physical Therapy

## 2017-09-14 ENCOUNTER — Encounter: Payer: Self-pay | Admitting: Physical Therapy

## 2017-09-14 DIAGNOSIS — M6281 Muscle weakness (generalized): Secondary | ICD-10-CM

## 2017-09-14 DIAGNOSIS — R269 Unspecified abnormalities of gait and mobility: Secondary | ICD-10-CM

## 2017-09-14 DIAGNOSIS — S88112A Complete traumatic amputation at level between knee and ankle, left lower leg, initial encounter: Secondary | ICD-10-CM

## 2017-09-14 NOTE — Therapy (Signed)
[place] [facility] [address]. [place], [place], [address] Phone: [phone]   Fax:  [phone]  Physical Therapy Treatment  Patient Details  Name: [name] MRN: [id number] Date of Birth: 1934-05-28 Referring Provider: Dr. Leotis Pain   Encounter Date: 09/14/2017  PT End of Session - 09/14/17 0907    Visit Number  23    Number of Visits  25    Date for PT Re-Evaluation  09/22/17    PT Start Time  0902    PT Stop Time  0944    PT Time Calculation (min)  42 min    Equipment Utilized During Treatment  Gait belt    Activity Tolerance  Patient tolerated treatment well    Behavior During Therapy  [facility] for tasks assessed/performed       Past Medical History:  Diagnosis Date  . Arthritis   . Atrial fibrillation ([place])   . Carcinoma of prostate ([place])   . CHF (congestive heart failure) ([place])   . Chronic kidney disease   . Diabetes mellitus without complication ([place])   . ED (erectile dysfunction)   . Frequent urination   . Hyperlipidemia   . Hypertension   . Moderate mitral insufficiency   . Peripheral vascular disease ([place])   . Pneumonia 04/2016  . Prostate cancer ([place])   . Sleep apnea    OSA--USE C-PAP  . Ulcer of left foot due to type 2 diabetes mellitus ([place])   . Urinary stress incontinence, male     Past Surgical History:  Procedure Laterality Date  . AMPUTATION Left 01/12/2017   Procedure: AMPUTATION BELOW KNEE;  Surgeon: Algernon Huxley, MD;  Location: [facility];  Service: General;  Laterality: Left;  . APLIGRAFT PLACEMENT Left 10/15/2016   Procedure: APLIGRAFT PLACEMENT;  Surgeon: Albertine Patricia, DPM;  Location: [facility];  Service: Podiatry;  Laterality: Left;  necrotic ulcer  . CHOLECYSTECTOMY    . EYE SURGERY Bilateral    Cataract Extraction with IOL  . IRRIGATION AND DEBRIDEMENT FOOT Left 10/15/2016   Procedure: IRRIGATION AND DEBRIDEMENT FOOT-EXCISIONAL DEBRIDEMENT OF SKIN 3RD, 4TH AND 5TH TOES WITH APPLICATION OF  APLIGRAFT;  Surgeon: Albertine Patricia, DPM;  Location: [facility];  Service: Podiatry;  Laterality: Left;  necrotic,gangrene  . IRRIGATION AND DEBRIDEMENT FOOT Left 12/09/2016   Procedure: IRRIGATION AND DEBRIDEMENT FOOT-MUSCLE/FASCIA, RESECTION OF FOURTH AND FIFTH METATARSAL NECROTIC BONE;  Surgeon: Albertine Patricia, DPM;  Location: [facility];  Service: Podiatry;  Laterality: Left;  . LOWER EXTREMITY ANGIOGRAPHY Left 08/16/2016   Procedure: Lower Extremity Angiography;  Surgeon: Algernon Huxley, MD;  Location: [place] [facility];  Service: Cardiovascular;  Laterality: Left;  . LOWER EXTREMITY ANGIOGRAPHY Left 09/01/2016   Procedure: Lower Extremity Angiography;  Surgeon: Algernon Huxley, MD;  Location: [place] [facility];  Service: Cardiovascular;  Laterality: Left;  . LOWER EXTREMITY ANGIOGRAPHY Left 10/14/2016   Procedure: Lower Extremity Angiography;  Surgeon: Algernon Huxley, MD;  Location: [place] [facility];  Service: Cardiovascular;  Laterality: Left;  . LOWER EXTREMITY ANGIOGRAPHY Left 12/20/2016   Procedure: Lower Extremity Angiography;  Surgeon: Algernon Huxley, MD;  Location: [place] [facility];  Service: Cardiovascular;  Laterality: Left;  . LOWER EXTREMITY INTERVENTION  09/01/2016   Procedure: Lower Extremity Intervention;  Surgeon: Algernon Huxley, MD;  Location: [place] [facility];  Service: Cardiovascular;;  . PROSTATE SURGERY     removal  . TONSILLECTOMY     as a child  There were no vitals filed for this visit.  Subjective Assessment - 09/14/17 0908    Subjective  Pt has been practicing his sit<>stand technique with written cues.  Pt denies any stumbles or falls.  Pt is still having some discomfort along lateral distal LLE which his prosthetist is aware of.     Patient is accompained by:  Family member    Limitations  Lifting;Standing;Walking;House hold activities    Patient Stated Goals  improve walking/ standing tolerance and safety.      Currently in Pain?  Yes    Pain Score  4      Pain Location  Leg    Pain Orientation  Lateral    Pain Descriptors / Indicators  Discomfort    Pain Type  Chronic pain    Pain Onset  More than a month ago       TREATMENT   Ambulating in agility ladder with 1 foot for each step to promote equal step length.   Ambulating outside on sidewalk and in grass with cues for equal step length (pt using SPC). Pt requires min assist for safety. Requires seated rest break between bouts. x80 ft, x120 ft, x200 ft, x200 ft   Sit to stands x10x. Pt requires mod cues to scoot to edge of seat, sliding feet back, and leaning anteriorly. Improved carryover from previous session.   L SLR x10 in supine   Supine knee presses with manual overpressure for hamstring stretch with 10 second holds x10   Seated LAQ with 5 second holds x10 with cues for full knee extension.   Ambulating in gym and stepping up and over airex pads   Noted new scratch along L lateral thigh which pt first noticed a few days prior. Instructed pt to monitor for proper healing.                          PT Education - 09/14/17 0907    Education provided  Yes    Education Details  Exercise technique    Person(s) Educated  Patient    Methods  Explanation;Verbal cues;Demonstration    Comprehension  Verbalized understanding;Returned demonstration;Verbal cues required;Need further instruction          PT Long Term Goals - 08/31/17 1327      PT LONG TERM GOAL #1   Title  Pt. able to transfer from w/c to least assistive device with mod. I and use of prosthesis safety.      Baseline  mod. I with use of RW or SPC    Time  4    Period  Weeks    Status  Achieved    Target Date  07/26/17      PT LONG TERM GOAL #2   Title  Pt. will increase FOTO to 49 to improve functional mobility.      Baseline  FOTO on 2/6: 34.  3/4: 49    Time  4    Period  Weeks    Status  Achieved    Target Date  07/26/17      PT LONG TERM GOAL #3   Title  Pt. able to don/ doff L  prosthesis with mod. I prior to PT tx. session to promote ability to walk to car/ ambulate into PT clinic.      Baseline  mod. I for extra time.     Time  4    Period  Weeks  Status  Achieved    Target Date  07/26/17      PT LONG TERM GOAL #4   Title  Pt. able to ambulate 300 feet with use of least assistive device and prosthesis to improve safe functional mobility.      Baseline  450 feet with use of SPC around PT building/ complex with mod. I with good step pattern/ cadence.     Time  4    Period  Weeks    Status  Achieved    Target Date  08/25/17      PT LONG TERM GOAL #5   Title  Pt. able to amb. up/down ramp at home with mod. I and use of prosthesis safely.      Baseline  mod independent with use of SPC and ramp at home.     Time  4    Period  Weeks    Status  Achieved    Target Date  08/25/17      Additional Long Term Goals   Additional Long Term Goals  Yes      PT LONG TERM GOAL #6   Title  Pt. able to amb. community distance with use of SPC on level surfaces safety with no LOB.     Baseline  pt. able to amb. with SPC and SBA/mod. I for safety outside.  Pt. will use scooter at [employer] to avoid walking due to being fearful of falling.     Time  4    Period  Weeks    Status  Partially Met    Target Date  09/22/17      PT LONG TERM GOAL #7   Title  Pt. able to stand from low chair/ commode with no UE assist safely.      Baseline  Extra time/ several attempts to stand from low chair with use of prosthesis.      Time  4    Period  Weeks    Status  New    Target Date  09/22/17      PT LONG TERM GOAL #8   Title  Pt. will be able to ambulate in yard/ uneven terrain with use of least assistive device and no LOB safely to allow pt. to complete yard work/ mowing.     Baseline  Pt. using SPC to ambulate to riding mower.  Pt. hesitant to ambulate around yard due to fear of falling.      Time  4    Period  Weeks    Status  New    Target Date  09/22/17             Plan - 09/14/17 0909    Clinical Impression Statement  Reviewed sit<>stand technique with pt requiring mod verbal cues for proper and safe technique.  Continued efforts to improve ambulatory endurance with pt requiring seated rest breaks due to fatigue.  Cues for upright posture and forward gaze while ambulating and for improved foot clearance LLE.  Pt will benefit from continued skilled PT interventions for improved ambulatory endurance and gait mechanics.      Rehab Potential  Good    PT Frequency  2x / week    PT Duration  4 weeks    PT Treatment/Interventions  ADLs/Self Care Home Management;Therapeutic activities;Functional mobility training;Stair training;Gait training;Therapeutic exercise;Balance training;Neuromuscular re-education;Patient/family education;Manual techniques;Scar mobilization;Passive range of motion;Prosthetic Training    PT Next Visit Plan  Gait training to least resistive device/ LE strengthening.  Reassess rash  on distal aspect of residual limb.     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 BKA stump complication ([place]) 78/67/5449  . 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) 02/10/2017  . S/P BKA (below knee amputation) unilateral, left ([place]) 02/01/2017  . Chronic diastolic heart  failure ([place]) 01/21/2017  . Pressure injury of skin 01/18/2017  . 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 Diabetic foot ulcer ([place]) 12/29/2016  . Ataxia 10/21/2016  . History of femoral angiogram 09/06/2016  . Left leg pain 09/06/2016  . Leukocytosis 09/06/2016  . Generalized weakness 09/06/2016  . A-fib ([place]) 08/30/2016  . Pneumonia 05/19/2016  . Hyperlipidemia 04/20/2016  . Back pain 04/19/2016  . Type 2 diabetes mellitus treated with insulin ([place]) 04/19/2016  . Essential hypertension 04/19/2016  . PAD (peripheral artery disease) ([place]) 04/19/2016  . Erectile dysfunction following radical prostatectomy 06/25/2015  . History of prostate cancer 12/23/2014  . Incontinence 12/23/2014    Collie Siad PT, DPT 09/14/2017, 9:36 AM  [place] [facility] [address] [place], [place], [address] Phone: [phone]   Fax:  [phone]  Name: [name] MRN: [id number] Date of Birth: 08/07/1934

## 2017-09-16 ENCOUNTER — Ambulatory Visit: Payer: Medicare Other | Admitting: Urology

## 2017-09-22 ENCOUNTER — Encounter: Payer: Self-pay | Admitting: Physical Therapy

## 2017-09-22 ENCOUNTER — Ambulatory Visit: Payer: Medicare Other | Attending: Vascular Surgery | Admitting: Physical Therapy

## 2017-09-22 DIAGNOSIS — M6281 Muscle weakness (generalized): Secondary | ICD-10-CM

## 2017-09-22 DIAGNOSIS — R269 Unspecified abnormalities of gait and mobility: Secondary | ICD-10-CM | POA: Diagnosis present

## 2017-09-22 DIAGNOSIS — S88112A Complete traumatic amputation at level between knee and ankle, left lower leg, initial encounter: Secondary | ICD-10-CM | POA: Insufficient documentation

## 2017-09-22 NOTE — Therapy (Addendum)
[facility] [address]. [place], [place], [address] Phone: [phone]   Fax:  [phone]  Physical Therapy Treatment  Patient Details  Name: [name] MRN: [id number] Date of Birth: [birth year] Referring Provider: Dr. Leotis Pain   Encounter Date: 09/22/2017  PT End of Session - 09/22/17 1036    Visit Number  24    Number of Visits  25    Date for PT Re-Evaluation  09/22/17    PT Start Time  1021    Activity Tolerance  Patient tolerated treatment well    Behavior During Therapy  [facility] for tasks assessed/performed       Past Medical History:  Diagnosis Date  . Arthritis   . Atrial fibrillation ([place])   . Carcinoma of prostate ([place])   . CHF (congestive heart failure) ([place])   . Chronic kidney disease   . Diabetes mellitus without complication ([place])   . ED (erectile dysfunction)   . Frequent urination   . Hyperlipidemia   . Hypertension   . Moderate mitral insufficiency   . Peripheral vascular disease ([place])   . Pneumonia 04/2016  . Prostate cancer ([place])   . Sleep apnea    OSA--USE C-PAP  . Ulcer of left foot due to type 2 diabetes mellitus ([place])   . Urinary stress incontinence, male     Past Surgical History:  Procedure Laterality Date  . AMPUTATION Left 01/12/2017   Procedure: AMPUTATION BELOW KNEE;  Surgeon: Algernon Huxley, MD;  Location: [facility];  Service: General;  Laterality: Left;  . APLIGRAFT PLACEMENT Left 10/15/2016   Procedure: APLIGRAFT PLACEMENT;  Surgeon: Albertine Patricia, DPM;  Location: [facility];  Service: Podiatry;  Laterality: Left;  necrotic ulcer  . CHOLECYSTECTOMY    . EYE SURGERY Bilateral    Cataract Extraction with IOL  . IRRIGATION AND DEBRIDEMENT FOOT Left 10/15/2016   Procedure: IRRIGATION AND DEBRIDEMENT FOOT-EXCISIONAL DEBRIDEMENT OF SKIN 3RD, 4TH AND 5TH TOES WITH APPLICATION OF APLIGRAFT;  Surgeon: Albertine Patricia, DPM;  Location: [facility];  Service: Podiatry;  Laterality: Left;   necrotic,gangrene  . IRRIGATION AND DEBRIDEMENT FOOT Left 12/09/2016   Procedure: IRRIGATION AND DEBRIDEMENT FOOT-MUSCLE/FASCIA, RESECTION OF FOURTH AND FIFTH METATARSAL NECROTIC BONE;  Surgeon: Albertine Patricia, DPM;  Location: [facility];  Service: Podiatry;  Laterality: Left;  . LOWER EXTREMITY ANGIOGRAPHY Left 08/16/2016   Procedure: Lower Extremity Angiography;  Surgeon: Algernon Huxley, MD;  Location: [place] [facility];  Service: Cardiovascular;  Laterality: Left;  . LOWER EXTREMITY ANGIOGRAPHY Left 09/01/2016   Procedure: Lower Extremity Angiography;  Surgeon: Algernon Huxley, MD;  Location: [place] [facility];  Service: Cardiovascular;  Laterality: Left;  . LOWER EXTREMITY ANGIOGRAPHY Left 10/14/2016   Procedure: Lower Extremity Angiography;  Surgeon: Algernon Huxley, MD;  Location: [place] [facility];  Service: Cardiovascular;  Laterality: Left;  . LOWER EXTREMITY ANGIOGRAPHY Left 12/20/2016   Procedure: Lower Extremity Angiography;  Surgeon: Algernon Huxley, MD;  Location: [place] [facility];  Service: Cardiovascular;  Laterality: Left;  . LOWER EXTREMITY INTERVENTION  09/01/2016   Procedure: Lower Extremity Intervention;  Surgeon: Algernon Huxley, MD;  Location: [place] [facility];  Service: Cardiovascular;;  . PROSTATE SURGERY     removal  . TONSILLECTOMY     as a child    There were no vitals filed for this visit.  Subjective Assessment - 09/22/17 1027    Subjective  Pt. reports no new issues.  Pt.  entered PT with use of SPC.      Patient is accompained by:  Family member    Limitations  Lifting;Standing;Walking;House hold activities    Patient Stated Goals  improve walking/ standing tolerance and safety.      Currently in Pain?  No/denies        Decrease frequency to 1x/week  There.:  Scifit L6 10 min. B UE/LE.  Sled pushing in hallway/ gym to simulate push mower.     Neuro:  Airex tasks/ wt. Shifting.    Gait training:  Walking in //-bars/ clinic with no assistive  device.  Recip. Stair climbing/ descending with B UE to 1 UE assist safetly.    Walking in agility ladder working on consistent step pattern.  Walking outside on varying terrain.          Pt. progressing well with gait technique/ endurance in PT clinic and especially outside. Pt. able to ambulate around outside of PT clinic with use of SPC and min. cuing to correct head position/ posture. Pt. perseverates on questions during PT tx. session    PT Long Term Goals - 08/31/17 1327      PT LONG TERM GOAL #1   Title  Pt. able to transfer from w/c to least assistive device with mod. I and use of prosthesis safety.      Baseline  mod. I with use of RW or SPC    Time  4    Period  Weeks    Status  Achieved    Target Date  07/26/17      PT LONG TERM GOAL #2   Title  Pt. will increase FOTO to 49 to improve functional mobility.      Baseline  FOTO on 2/6: 34.  3/4: 49    Time  4    Period  Weeks    Status  Achieved    Target Date  07/26/17      PT LONG TERM GOAL #3   Title  Pt. able to don/ doff L prosthesis with mod. I prior to PT tx. session to promote ability to walk to car/ ambulate into PT clinic.      Baseline  mod. I for extra time.     Time  4    Period  Weeks    Status  Achieved    Target Date  07/26/17      PT LONG TERM GOAL #4   Title  Pt. able to ambulate 300 feet with use of least assistive device and prosthesis to improve safe functional mobility.      Baseline  450 feet with use of SPC around PT building/ complex with mod. I with good step pattern/ cadence.     Time  4    Period  Weeks    Status  Achieved    Target Date  08/25/17      PT LONG TERM GOAL #5   Title  Pt. able to amb. up/down ramp at home with mod. I and use of prosthesis safely.      Baseline  mod independent with use of SPC and ramp at home.     Time  4    Period  Weeks    Status  Achieved    Target Date  08/25/17      Additional Long Term Goals   Additional Long Term Goals  Yes      PT  LONG TERM GOAL #6   Title  Pt. able  to amb. community distance with use of SPC on level surfaces safety with no LOB.     Baseline  pt. able to amb. with SPC and SBA/mod. I for safety outside.  Pt. will use scooter at [employer] to avoid walking due to being fearful of falling.     Time  4    Period  Weeks    Status  Partially Met    Target Date  09/22/17      PT LONG TERM GOAL #7   Title  Pt. able to stand from low chair/ commode with no UE assist safely.      Baseline  Extra time/ several attempts to stand from low chair with use of prosthesis.      Time  4    Period  Weeks    Status  New    Target Date  09/22/17      PT LONG TERM GOAL #8   Title  Pt. will be able to ambulate in yard/ uneven terrain with use of least assistive device and no LOB safely to allow pt. to complete yard work/ mowing.     Baseline  Pt. using SPC to ambulate to riding mower.  Pt. hesitant to ambulate around yard due to fear of falling.      Time  4    Period  Weeks    Status  New    Target Date  09/22/17            Plan - 09/22/17 1036    Clinical Presentation  Evolving    Clinical Decision Making  Moderate    Rehab Potential  Good    PT Frequency  2x / week    PT Duration  4 weeks    PT Treatment/Interventions  ADLs/Self Care Home Management;Therapeutic activities;Functional mobility training;Stair training;Gait training;Therapeutic exercise;Balance training;Neuromuscular re-education;Patient/family education;Manual techniques;Scar mobilization;Passive range of motion;Prosthetic Training    Consulted and Agree with Plan of Care  Patient       Patient will benefit from skilled therapeutic intervention in order to improve the following deficits and impairments:  Abnormal gait, Decreased balance, Decreased endurance, Decreased mobility, Decreased skin integrity, Difficulty walking, Improper body mechanics, Decreased range of motion, Decreased activity tolerance, Decreased strength, Decreased safety  awareness, Impaired flexibility  Visit Diagnosis: Muscle weakness (generalized)  Gait difficulty  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 BKA stump complication ([place]) 97/06/6376  . 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 S/P BKA (below knee amputation) unilateral, left ([place]) 02/01/2017  . Chronic diastolic heart failure ([place]) 01/21/2017  . Pressure injury of skin 01/18/2017  . 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 Diabetic foot ulcer ([place]) 12/29/2016  . Ataxia 10/21/2016  . History of femoral angiogram 09/06/2016  . Left leg pain 09/06/2016  . Leukocytosis 09/06/2016  . Generalized weakness 09/06/2016  . A-fib ([place]) 08/30/2016  . Pneumonia 05/19/2016  . Hyperlipidemia 04/20/2016  . Back pain 04/19/2016  . Type 2 diabetes mellitus treated with insulin ([place]) 04/19/2016  . Essential hypertension 04/19/2016  . PAD (peripheral artery disease) ([place]) 04/19/2016  . Erectile dysfunction following radical prostatectomy 06/25/2015  . History of prostate cancer 12/23/2014  . Incontinence 12/23/2014   Pura Spice, PT, DPT # [id number] 09/22/2017, 10:43 AM  [place]  [address] [place]ari [place], [place], [address] Phone: [phone]   Fax:  [phone]  Name: [name] MRN: [id number] Date of Birth: [birth year]

## 2017-09-29 ENCOUNTER — Encounter: Payer: Self-pay | Admitting: Urology

## 2017-09-29 ENCOUNTER — Ambulatory Visit (INDEPENDENT_AMBULATORY_CARE_PROVIDER_SITE_OTHER): Payer: Medicare Other | Admitting: Urology

## 2017-09-29 VITALS — BP 142/99 | HR 88 | Resp 16 | Ht 67.0 in | Wt 180.0 lb

## 2017-09-29 DIAGNOSIS — C61 Malignant neoplasm of prostate: Secondary | ICD-10-CM

## 2017-09-29 DIAGNOSIS — N39498 Other specified urinary incontinence: Secondary | ICD-10-CM | POA: Diagnosis not present

## 2017-09-29 LAB — BLADDER SCAN AMB NON-IMAGING

## 2017-09-29 NOTE — Progress Notes (Signed)
09/29/2017 11:52 AM   [name] Mchan October 29, 1934 [id number]  Referring provider: Alanson Aly, [place], [place] [address]  No chief complaint on file.   HPI: Patient is an 83 year old Caucasian male with a history of prostate cancer who presents today for follow up of incontinence.    He states he is having incontinence episodes up to ten times a day.  He is going through 4 to 5 depends daily and 1 to 3 depends at night.  He has been dealing with incontinence since his RRP 6 years ago.  It has worsened since his below the knee amputation for PAD and DM causing gangrene and ulcers.  Patient denies any gross hematuria, dysuria or suprapubic/flank pain.  Patient denies any fevers, chills, nausea or vomiting.   His PVR was 46 mL in March 2019 and 134 in May 2019.       He has had a trial of anticholinergics for the incontinence in the past, but it was ineffective.  He was then started on Myrbetriq 50 mg in March 2019 without relief.  In discussion with the patient today, he does not feel the urge to urinate before his incontinence start.  He does not notice worsening of his incontinence when he coughs, sneezes, or laughs.  He feels like he is just continuously leaking which he notices only when his depends start to become wet.  He has a history of Gleason's 6 adenocarcinoma of the prostate who underwent a radical prostatectomy approximately 6 years by Dr. Rosana Hoes in [place].  His PSA's have been undetectable since that time.  His iPSA was 7.4 ng/mL.   His last PSA was <0.1 ng/mL on 06/25/2015.    PMH: Past Medical History:  Diagnosis Date  . Arthritis   . Atrial fibrillation ([place])   . Carcinoma of prostate ([place])   . CHF (congestive heart failure) ([place])   . Chronic kidney disease   . Diabetes mellitus without complication ([place])   . ED (erectile dysfunction)   . Frequent urination   . Hyperlipidemia   . Hypertension   . Moderate mitral  insufficiency   . Peripheral vascular disease ([place])   . Pneumonia 04/2016  . Prostate cancer ([place])   . Sleep apnea    OSA--USE C-PAP  . Ulcer of left foot due to type 2 diabetes mellitus ([place])   . Urinary stress incontinence, male     Surgical History: Past Surgical History:  Procedure Laterality Date  . AMPUTATION Left 01/12/2017   Procedure: AMPUTATION BELOW KNEE;  Surgeon: Algernon Huxley, MD;  Location: [facility];  Service: General;  Laterality: Left;  . APLIGRAFT PLACEMENT Left 10/15/2016   Procedure: APLIGRAFT PLACEMENT;  Surgeon: Albertine Patricia, DPM;  Location: [facility];  Service: Podiatry;  Laterality: Left;  necrotic ulcer  . CHOLECYSTECTOMY    . EYE SURGERY Bilateral    Cataract Extraction with IOL  . IRRIGATION AND DEBRIDEMENT FOOT Left 10/15/2016   Procedure: IRRIGATION AND DEBRIDEMENT FOOT-EXCISIONAL DEBRIDEMENT OF SKIN 3RD, 4TH AND 5TH TOES WITH APPLICATION OF APLIGRAFT;  Surgeon: Albertine Patricia, DPM;  Location: [facility];  Service: Podiatry;  Laterality: Left;  necrotic,gangrene  . IRRIGATION AND DEBRIDEMENT FOOT Left 12/09/2016   Procedure: IRRIGATION AND DEBRIDEMENT FOOT-MUSCLE/FASCIA, RESECTION OF FOURTH AND FIFTH METATARSAL NECROTIC BONE;  Surgeon: Albertine Patricia, DPM;  Location: [facility];  Service: Podiatry;  Laterality: Left;  . LOWER EXTREMITY ANGIOGRAPHY Left 08/16/2016   Procedure: Lower Extremity Angiography;  Surgeon:  Algernon Huxley, MD;  Location: [place] [facility];  Service: Cardiovascular;  Laterality: Left;  . LOWER EXTREMITY ANGIOGRAPHY Left 09/01/2016   Procedure: Lower Extremity Angiography;  Surgeon: Algernon Huxley, MD;  Location: [place] [facility];  Service: Cardiovascular;  Laterality: Left;  . LOWER EXTREMITY ANGIOGRAPHY Left 10/14/2016   Procedure: Lower Extremity Angiography;  Surgeon: Algernon Huxley, MD;  Location: [place] [facility];  Service: Cardiovascular;  Laterality: Left;  . LOWER EXTREMITY ANGIOGRAPHY Left 12/20/2016   Procedure: Lower Extremity  Angiography;  Surgeon: Algernon Huxley, MD;  Location: [place] [facility];  Service: Cardiovascular;  Laterality: Left;  . LOWER EXTREMITY INTERVENTION  09/01/2016   Procedure: Lower Extremity Intervention;  Surgeon: Algernon Huxley, MD;  Location: [place] [facility];  Service: Cardiovascular;;  . PROSTATE SURGERY     removal  . TONSILLECTOMY     as a child    Home Medications:  Allergies as of 09/29/2017      Reactions   Contrast Media [iodinated Diagnostic Agents] Shortness Of Breath   SOB   Iohexol Shortness Of Breath    Desc: Respiratory Distress, Laryngedema, diaphoresis, Onset Date: 53299242   Metrizamide Shortness Of Breath   SOB SOB SOB   Latex Rash      Medication List        Accurate as of 09/29/17 11:52 AM. Always use your most recent med list.          AMITIZA 24 MCG capsule Generic drug:  lubiprostone Take by mouth.   atorvastatin 80 MG tablet Commonly known as:  LIPITOR Take 80 mg by mouth daily at 6 PM.   digoxin 0.125 MG tablet Commonly known as:  LANOXIN Take 1 tablet (0.125 mg total) by mouth daily.   diltiazem 300 MG 24 hr capsule Commonly known as:  CARDIZEM CD Take 300 mg by mouth daily.   furosemide 20 MG tablet Commonly known as:  LASIX Take 1 tablet (20 mg total) by mouth daily.   insulin aspart 100 UNIT/ML injection Commonly known as:  novoLOG Inject 0-5 Units into the skin 3 (three) times daily with meals.   insulin glargine 100 UNIT/ML injection Commonly known as:  LANTUS Inject 0.1 mLs (10 Units total) into the skin at bedtime.   JARDIANCE 10 MG Tabs tablet Generic drug:  empagliflozin   lamoTRIgine 25 MG tablet Commonly known as:  LAMICTAL Take by mouth.   metFORMIN 1000 MG tablet Commonly known as:  GLUCOPHAGE TAKE 1 TABLET TWICE A DAY WITH MEALS   mirabegron ER 25 MG Tb24 tablet Commonly known as:  MYRBETRIQ Take 1 tablet (25 mg total) by mouth daily.   oxyCODONE 5 MG immediate release tablet Commonly known as:  Oxy  IR/ROXICODONE Take 1 tablet (5 mg total) by mouth every 6 (six) hours as needed for moderate pain.   oxyCODONE 10 mg 12 hr tablet Commonly known as:  OXYCONTIN Take 1 tablet (10 mg total) by mouth every 12 (twelve) hours.   potassium chloride SA 20 MEQ tablet Commonly known as:  K-DUR,KLOR-CON Take by mouth.   silver sulfADIAZINE 1 % cream Commonly known as:  SILVADENE Apply topically daily.   TOPROL XL 100 MG 24 hr tablet Generic drug:  metoprolol succinate Take 100 mg by mouth daily.   TRULICITY 6.83 MH/9.6QI Sopn Generic drug:  Dulaglutide 0.75 mg once a week.   XARELTO 15 MG Tabs tablet Generic drug:  Rivaroxaban Take by mouth.  Allergies:  Allergies  Allergen Reactions  . Contrast Media [Iodinated Diagnostic Agents] Shortness Of Breath    SOB  . Iohexol Shortness Of Breath     Desc: Respiratory Distress, Laryngedema, diaphoresis, Onset Date: 69629528   . Metrizamide Shortness Of Breath    SOB SOB SOB   . Latex Rash    Family History: Family History  Problem Relation Age of Onset  . Hypertension Mother   . Diabetes Mother   . Prostate cancer Neg Hx   . Bladder Cancer Neg Hx   . Kidney disease Neg Hx     Social History:  reports that he quit smoking about 22 years ago. His smoking use included cigarettes. He smoked 0.25 packs per day. He has never used smokeless tobacco. He reports that he does not drink alcohol or use drugs.  ROS: UROLOGY Frequent Urination?: Yes Hard to postpone urination?: Yes Burning/pain with urination?: No Get up at night to urinate?: Yes Leakage of urine?: Yes Urine stream starts and stops?: Yes Trouble starting stream?: No Do you have to strain to urinate?: No Blood in urine?: No Urinary tract infection?: No Sexually transmitted disease?: No Injury to kidneys or bladder?: No Painful intercourse?: No Weak stream?: No Erection problems?: No Penile pain?: No  Gastrointestinal Nausea?: No Vomiting?:  No Indigestion/heartburn?: No Diarrhea?: No Constipation?: No  Constitutional Fever: No Night sweats?: No Weight loss?: No Fatigue?: No  Skin Skin rash/lesions?: No Itching?: No  Eyes Blurred vision?: No Double vision?: No  Ears/Nose/Throat Sore throat?: No Sinus problems?: No  Hematologic/Lymphatic Swollen glands?: No Easy bruising?: No  Cardiovascular Leg swelling?: No Chest pain?: No  Respiratory Cough?: No Shortness of breath?: No  Endocrine Excessive thirst?: No  Musculoskeletal Back pain?: No Joint pain?: No  Neurological Headaches?: No Dizziness?: No  Psychologic Depression?: No Anxiety?: No  Physical Exam: BP (!) 142/99   Pulse 88   Resp 16   Ht 5\' 7"  (1.702 m)   Wt 180 lb (81.6 kg)   SpO2 98%   BMI 28.19 kg/m   Constitutional:  Alert and oriented, No acute distress. HEENT: [place] AT, moist mucus membranes.  Trachea midline, no masses. Cardiovascular: No clubbing, cyanosis, or edema. Respiratory: Normal respiratory effort, no increased work of breathing. GI: Abdomen is soft, nontender, nondistended, no abdominal masses GU: No CVA tenderness.  Normal phallus.  Testicles descended equally bilaterally.  Benign.  Minor skin irritation from depends and urine.  The patient was asked to cough multiple times and no urinary leakage was noted with this coughing with the patient in the standing position. Skin: No rashes, bruises or suspicious lesions. Lymph: No cervical or inguinal adenopathy. Neurologic: Grossly intact, no focal deficits, moving all 4 extremities. Psychiatric: Normal mood and affect.  Laboratory Data: Lab Results  Component Value Date   WBC 12.0 (H) 02/12/2017   HGB 11.8 (L) 02/12/2017   HCT 34.7 (L) 02/12/2017   MCV 82.6 02/12/2017   PLT 296 02/12/2017    Lab Results  Component Value Date   CREATININE 0.69 02/12/2017    No results found for: PSA  No results found for: TESTOSTERONE  Lab Results  Component Value Date    HGBA1C 7.4 (H) 12/30/2016    Urinalysis    Component Value Date/Time   COLORURINE YELLOW (A) 01/08/2017 1010   APPEARANCEUR CLEAR (A) 01/08/2017 1010   LABSPEC 1.020 01/08/2017 1010   PHURINE 6.0 01/08/2017 1010   GLUCOSEU >=500 (A) 01/08/2017 1010   HGBUR NEGATIVE 01/08/2017 1010  BILIRUBINUR NEGATIVE 01/08/2017 1010   KETONESUR NEGATIVE 01/08/2017 1010   PROTEINUR 30 (A) 01/08/2017 1010   NITRITE NEGATIVE 01/08/2017 1010   LEUKOCYTESUR NEGATIVE 01/08/2017 1010    Assessment & Plan:   1. History of prostate cancer:   Patient underwent a radical prostatectomy proximately 5 years ago by Dr. Rosana Hoes in [place]. His Gleason score was 6 at the time of his biopsy.  His last PSA's was undetectable.  2. Incontinence:    The patient is continuously incontinent throughout the day.  The etiology is not exactly clear as he does not have classic signs for either urge incontinence or stress urinary incontinence.  He has tried without success Myrbetriq and an anticholinergic. Given his history of prostatectomy and large volumes of urine that he leaks daily, stress incontinence is likely a major contributor though it could not be reproduced on exam today.  We did discuss that stress incontinence is typically a surgical disease, and he is not a great candidate for reconstructive surgery given his age and comorbidities.  We discussed the best option for him would be to try a Cunningham clamp.  I showed him an example device in the office today.  We discussed how to use the device as well as how to remove it.  We discussed that the device we be worn at all times and only removed when he is ready to urinate.  This should help keep him dry and resolve his dependence on Depends.  We discussed that the device should only be tightened just enough to prevent leakage of urine.  The patient will obtain this device from [facility] as this is the cheapest way to obtain it.  He will follow-up in 6 weeks for further  evaluation.  If he is having trouble using the device, he will call the office for a nurse visit for further teaching.  If he is unable to tolerate this device or is still having issues, he may need a formal cystoscopy and urodynamics at that time.  Return in about 6 weeks (around 11/10/2017).  Nickie Retort, MD  [facility] [address] [place], [place] [address] [phone]

## 2017-09-30 ENCOUNTER — Encounter: Payer: Medicare Other | Admitting: Physical Therapy

## 2017-10-06 ENCOUNTER — Ambulatory Visit: Payer: Medicare Other | Admitting: Physical Therapy

## 2017-10-06 DIAGNOSIS — R269 Unspecified abnormalities of gait and mobility: Secondary | ICD-10-CM

## 2017-10-06 DIAGNOSIS — M6281 Muscle weakness (generalized): Secondary | ICD-10-CM

## 2017-10-06 DIAGNOSIS — S88112A Complete traumatic amputation at level between knee and ankle, left lower leg, initial encounter: Secondary | ICD-10-CM

## 2017-10-06 NOTE — Therapy (Addendum)
[facility] [address] [place], [place], [address] Phone: [phone]   Fax:  [phone]  Physical Therapy Treatment  Patient Details  Name: [name] MRN: [id number] Date of Birth: May 01, 1935 Referring Provider: Dr. Leotis Pain   Encounter Date: 10/06/2017    Treatment 25 of 28.  Recert date: 5/36/64 40:34VQ to 12:05PM   Past Medical History:  Diagnosis Date  . Arthritis   . Atrial fibrillation ([place])   . Carcinoma of prostate ([place])   . CHF (congestive heart failure) ([place])   . Chronic kidney disease   . Diabetes mellitus without complication ([place])   . ED (erectile dysfunction)   . Frequent urination   . Hyperlipidemia   . Hypertension   . Moderate mitral insufficiency   . Peripheral vascular disease ([place])   . Pneumonia 04/2016  . Prostate cancer ([place])   . Sleep apnea    OSA--USE C-PAP  . Ulcer of left foot due to type 2 diabetes mellitus ([place])   . Urinary stress incontinence, male     Past Surgical History:  Procedure Laterality Date  . AMPUTATION Left 01/12/2017   Procedure: AMPUTATION BELOW KNEE;  Surgeon: Algernon Huxley, MD;  Location: [facility] ORS;  Service: General;  Laterality: Left;  . APLIGRAFT PLACEMENT Left 10/15/2016   Procedure: APLIGRAFT PLACEMENT;  Surgeon: Albertine Patricia, DPM;  Location: [facility] OR[facility];  Service: Podiatry;  Laterality: Left;  necrotic ulcer  . CHOLECYSTECTOMY    . EYE SURGERY Bilateral    Cataract Extraction with IOL  . IRRIGATION AND DEBRIDEMENT FOOT Left 10/15/2016   Procedure: IRRIGATION AND DEBRIDEMENT FOOT-EXCISIONAL DEBRIDEMENT OF SKIN 3RD, 4TH AND 5TH TOES WITH APPLICATION OF APLIGRAFT;  Surgeon: Albertine Patricia, DPM;  Location: [facility] OR[facility];  Service: Podiatry;  Laterality: Left;  necrotic,gangrene  . IRRIGATION AND DEBRIDEMENT FOOT Left 12/09/2016   Procedure: IRRIGATION AND DEBRIDEMENT FOOT-MUSCLE/FASCIA, RESECTION OF FOURTH AND FIFTH METATARSAL NECROTIC BONE;  Surgeon: Albertine Patricia, DPM;  Location: [facility] OR[facility];  Service: Podiatry;  Laterality: Left;  . LOWER EXTREMITY ANGIOGRAPHY Left 08/16/2016   Procedure: Lower Extremity Angiography;  Surgeon: Algernon Huxley, MD;  Location: [place] [facility];  Service: Cardiovascular;  Laterality: Left;  . LOWER EXTREMITY ANGIOGRAPHY Left 09/01/2016   Procedure: Lower Extremity Angiography;  Surgeon: Algernon Huxley, MD;  Location: [place] [facility];  Service: Cardiovascular;  Laterality: Left;  . LOWER EXTREMITY ANGIOGRAPHY Left 10/14/2016   Procedure: Lower Extremity Angiography;  Surgeon: Algernon Huxley, MD;  Location: [place] [facility];  Service: Cardiovascular;  Laterality: Left;  . LOWER EXTREMITY ANGIOGRAPHY Left 12/20/2016   Procedure: Lower Extremity Angiography;  Surgeon: Algernon Huxley, MD;  Location: [place] [facility];  Service: Cardiovascular;  Laterality: Left;  . LOWER EXTREMITY INTERVENTION  09/01/2016   Procedure: Lower Extremity Intervention;  Surgeon: Algernon Huxley, MD;  Location: [place] [facility];  Service: Cardiovascular;;  . PROSTATE SURGERY     removal  . TONSILLECTOMY     as a child    There were no vitals filed for this visit.    Pt. Reports no new complaints.  Pt. Drove to front door of PT clinic and ambulated into clinci with use of SPC.  PT assisted pt. With door.           There.:  Scifit L6 10 min. B UE/LE. (no charge)  Reassessed use of sled in hallway 50-70# with instruction to correct posture.  No LOB.  Discussed standing hip ex. Program.  Pt. Had questions/ confused (PT issued handouts again).    Neuro:  Airex tasks/ wt. Shifting. (limited UE assist in //-bars)  Sit to stands from varying heights of blue mat table 10x (min. A from PT for verbal cuing)  Walking in //-bars/ clinic with no assistive device.  Recip. Stair climbing/ descending with B UE to 1 UE assist safetly.    Resisted gait pattern 1-2BTB 5x all 4-planes.            Pt. progressing well  with gait technique/ endurance in PT clinic and especially outside. Pt. Has confusion with instruction/ carry-over during balance/gait training.  Pt. Ambulating with inconsistent step length/ gait pattern and cadence with outside and gym based ex.  Pt. Has no issues transferring into/out of truck with use of prosthesis.  Pts. Hip strength and muscle endurance remain limited.  See updated goals.         PT Long Term Goals - 08/31/17 1327      PT LONG TERM GOAL #1   Title  Pt. able to transfer from w/c to least assistive device with mod. I and use of prosthesis safety.      Baseline  mod. I with use of RW or SPC    Time  4    Period  Weeks    Status  Achieved    Target Date  07/26/17      PT LONG TERM GOAL #2   Title  Pt. will increase FOTO to 49 to improve functional mobility.      Baseline  FOTO on 2/6: 34.  3/4: 49    Time  4    Period  Weeks    Status  Achieved    Target Date  07/26/17      PT LONG TERM GOAL #3   Title  Pt. able to don/ doff L prosthesis with mod. I prior to PT tx. session to promote ability to walk to car/ ambulate into PT clinic.      Baseline  mod. I for extra time.     Time  4    Period  Weeks    Status  Achieved    Target Date  07/26/17      PT LONG TERM GOAL #4   Title  Pt. able to ambulate 300 feet with use of least assistive device and prosthesis to improve safe functional mobility.      Baseline  450 feet with use of SPC around PT building/ complex with mod. I with good step pattern/ cadence.     Time  4    Period  Weeks    Status  Achieved    Target Date  08/25/17      PT LONG TERM GOAL #5   Title  Pt. able to amb. up/down ramp at home with mod. I and use of prosthesis safely.      Baseline  mod independent with use of SPC and ramp at home.     Time  4    Period  Weeks    Status  Achieved    Target Date  08/25/17      Additional Long Term Goals   Additional Long Term Goals  Yes      PT LONG TERM GOAL #6   Title  Pt. able to amb.  community distance with use of SPC on level surfaces safety with no LOB.     Baseline  pt. able to amb.  with SPC and SBA/mod. I for safety outside.  Pt. will use scooter at [employer] to avoid walking due to being fearful of falling.     Time  4    Period  Weeks    Status  Partially Met    Target Date  09/22/17      PT LONG TERM GOAL #7   Title  Pt. able to stand from low chair/ commode with no UE assist safely.      Baseline  Extra time/ several attempts to stand from low chair with use of prosthesis.      Time  4    Period  Weeks    Status  New    Target Date  09/22/17      PT LONG TERM GOAL #8   Title  Pt. will be able to ambulate in yard/ uneven terrain with use of least assistive device and no LOB safely to allow pt. to complete yard work/ mowing.     Baseline  Pt. using SPC to ambulate to riding mower.  Pt. hesitant to ambulate around yard due to fear of falling.      Time  4    Period  Weeks    Status  New    Target Date  09/22/17       Patient will benefit from skilled therapeutic intervention in order to improve the following deficits and impairments:  Abnormal gait, Decreased balance, Decreased endurance, Decreased mobility, Decreased skin integrity, Difficulty walking, Improper body mechanics, Decreased range of motion, Decreased activity tolerance, Decreased strength, Decreased safety awareness, Impaired flexibility  Visit Diagnosis: Muscle weakness (generalized)  Gait difficulty  Complete below knee amputation of left lower extremity, initial encounter [facility])     Problem List Patient Active Problem List   Diagnosis Date Noted  . NSTEMI (non-ST elevated myocardial infarction) ([place]) 01/16/2018  . Acute CHF (congestive heart failure) ([place]) 11/23/2017  . Atherosclerosis of native arteries of the extremities with ulceration ([place]) 11/18/2017  . Atrial fibrillation with rapid ventricular response ([place]) 10/15/2017  . BKA stump complication ([place]) 64/68/0321  . 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 S/P BKA (below knee amputation) unilateral, left ([place]) 02/01/2017  . Chronic diastolic heart failure ([place]) 01/21/2017  . Pressure injury of skin 01/18/2017  . Atherosclerosis of artery of extremity with gangrene ([place]) 01/05/2017  . Diabetic foot ulcer ([place]) 12/29/2016  . Ataxia 10/21/2016  . History of femoral angiogram 09/06/2016  . Left leg pain 09/06/2016  . Leukocytosis 09/06/2016  . Generalized weakness 09/06/2016  . A-fib ([place]) 08/30/2016  . Pneumonia 05/19/2016  . Hyperlipidemia 04/20/2016  . Back pain 04/19/2016  . Type 2 diabetes mellitus treated with insulin ([place]) 04/19/2016  . Essential hypertension 04/19/2016  . PAD (peripheral artery disease) ([place]) 04/19/2016  . Erectile dysfunction following radical prostatectomy 06/25/2015  . History of prostate cancer 12/23/2014  . Incontinence 12/23/2014   Pura Spice, PT, DPT # [phone] 10/07/2017, 7:51 AM   [facility] [address] [place], [place], [address] Phone: [phone]   Fax:  [phone]  Name: [name] MRN: [id number] Date of Birth: Jun 30, 1934

## 2017-10-13 ENCOUNTER — Ambulatory Visit: Payer: Medicare Other | Admitting: Physical Therapy

## 2017-10-13 DIAGNOSIS — M6281 Muscle weakness (generalized): Secondary | ICD-10-CM

## 2017-10-13 DIAGNOSIS — R269 Unspecified abnormalities of gait and mobility: Secondary | ICD-10-CM

## 2017-10-13 DIAGNOSIS — S88112A Complete traumatic amputation at level between knee and ankle, left lower leg, initial encounter: Secondary | ICD-10-CM

## 2017-10-14 ENCOUNTER — Inpatient Hospital Stay
Admission: EM | Admit: 2017-10-14 | Discharge: 2017-10-28 | DRG: 280 | Disposition: A | Discharge status: Skilled Nursing Facility | Payer: Medicare Other | Attending: Internal Medicine | Admitting: Internal Medicine

## 2017-10-14 DIAGNOSIS — R350 Frequency of micturition: Secondary | ICD-10-CM | POA: Diagnosis present

## 2017-10-14 DIAGNOSIS — N393 Stress incontinence (female) (male): Secondary | ICD-10-CM | POA: Diagnosis present

## 2017-10-14 DIAGNOSIS — Z66 Do not resuscitate: Secondary | ICD-10-CM | POA: Diagnosis present

## 2017-10-14 DIAGNOSIS — Z7901 Long term (current) use of anticoagulants: Secondary | ICD-10-CM

## 2017-10-14 DIAGNOSIS — Z5309 Procedure and treatment not carried out because of other contraindication: Secondary | ICD-10-CM | POA: Diagnosis present

## 2017-10-14 DIAGNOSIS — E1122 Type 2 diabetes mellitus with diabetic chronic kidney disease: Secondary | ICD-10-CM | POA: Diagnosis present

## 2017-10-14 DIAGNOSIS — I482 Chronic atrial fibrillation: Secondary | ICD-10-CM | POA: Diagnosis not present

## 2017-10-14 DIAGNOSIS — Z9049 Acquired absence of other specified parts of digestive tract: Secondary | ICD-10-CM

## 2017-10-14 DIAGNOSIS — I214 Non-ST elevation (NSTEMI) myocardial infarction: Secondary | ICD-10-CM | POA: Diagnosis present

## 2017-10-14 DIAGNOSIS — Z7982 Long term (current) use of aspirin: Secondary | ICD-10-CM

## 2017-10-14 DIAGNOSIS — Z9841 Cataract extraction status, right eye: Secondary | ICD-10-CM

## 2017-10-14 DIAGNOSIS — R402244 Coma scale, best verbal response, confused conversation, 24 hours or more after hospital admission: Secondary | ICD-10-CM | POA: Diagnosis not present

## 2017-10-14 DIAGNOSIS — Z791 Long term (current) use of non-steroidal anti-inflammatories (NSAID): Secondary | ICD-10-CM

## 2017-10-14 DIAGNOSIS — I639 Cerebral infarction, unspecified: Secondary | ICD-10-CM

## 2017-10-14 DIAGNOSIS — Z9842 Cataract extraction status, left eye: Secondary | ICD-10-CM

## 2017-10-14 DIAGNOSIS — L97519 Non-pressure chronic ulcer of other part of right foot with unspecified severity: Secondary | ICD-10-CM | POA: Diagnosis present

## 2017-10-14 DIAGNOSIS — Z833 Family history of diabetes mellitus: Secondary | ICD-10-CM

## 2017-10-14 DIAGNOSIS — G4733 Obstructive sleep apnea (adult) (pediatric): Secondary | ICD-10-CM | POA: Diagnosis present

## 2017-10-14 DIAGNOSIS — E1151 Type 2 diabetes mellitus with diabetic peripheral angiopathy without gangrene: Secondary | ICD-10-CM | POA: Diagnosis present

## 2017-10-14 DIAGNOSIS — Z9119 Patient's noncompliance with other medical treatment and regimen: Secondary | ICD-10-CM

## 2017-10-14 DIAGNOSIS — Z89512 Acquired absence of left leg below knee: Secondary | ICD-10-CM

## 2017-10-14 DIAGNOSIS — G9341 Metabolic encephalopathy: Secondary | ICD-10-CM | POA: Diagnosis present

## 2017-10-14 DIAGNOSIS — I70269 Atherosclerosis of native arteries of extremities with gangrene, unspecified extremity: Secondary | ICD-10-CM

## 2017-10-14 DIAGNOSIS — E860 Dehydration: Secondary | ICD-10-CM | POA: Diagnosis present

## 2017-10-14 DIAGNOSIS — I472 Ventricular tachycardia, unspecified: Secondary | ICD-10-CM

## 2017-10-14 DIAGNOSIS — E1010 Type 1 diabetes mellitus with ketoacidosis without coma: Secondary | ICD-10-CM

## 2017-10-14 DIAGNOSIS — E11621 Type 2 diabetes mellitus with foot ulcer: Secondary | ICD-10-CM | POA: Diagnosis present

## 2017-10-14 DIAGNOSIS — J9601 Acute respiratory failure with hypoxia: Secondary | ICD-10-CM | POA: Diagnosis present

## 2017-10-14 DIAGNOSIS — R471 Dysarthria and anarthria: Secondary | ICD-10-CM | POA: Diagnosis present

## 2017-10-14 DIAGNOSIS — Z9114 Patient's other noncompliance with medication regimen: Secondary | ICD-10-CM

## 2017-10-14 DIAGNOSIS — Z794 Long term (current) use of insulin: Secondary | ICD-10-CM

## 2017-10-14 DIAGNOSIS — Z888 Allergy status to other drugs, medicaments and biological substances status: Secondary | ICD-10-CM

## 2017-10-14 DIAGNOSIS — Z79891 Long term (current) use of opiate analgesic: Secondary | ICD-10-CM

## 2017-10-14 DIAGNOSIS — Z5329 Procedure and treatment not carried out because of patient's decision for other reasons: Secondary | ICD-10-CM | POA: Diagnosis present

## 2017-10-14 DIAGNOSIS — N183 Chronic kidney disease, stage 3 (moderate): Secondary | ICD-10-CM | POA: Diagnosis present

## 2017-10-14 DIAGNOSIS — I70235 Atherosclerosis of native arteries of right leg with ulceration of other part of foot: Secondary | ICD-10-CM | POA: Diagnosis present

## 2017-10-14 DIAGNOSIS — I5043 Acute on chronic combined systolic (congestive) and diastolic (congestive) heart failure: Secondary | ICD-10-CM | POA: Diagnosis present

## 2017-10-14 DIAGNOSIS — E1165 Type 2 diabetes mellitus with hyperglycemia: Secondary | ICD-10-CM | POA: Diagnosis present

## 2017-10-14 DIAGNOSIS — E876 Hypokalemia: Secondary | ICD-10-CM | POA: Diagnosis not present

## 2017-10-14 DIAGNOSIS — R402364 Coma scale, best motor response, obeys commands, 24 hours or more after hospital admission: Secondary | ICD-10-CM | POA: Diagnosis not present

## 2017-10-14 DIAGNOSIS — N529 Male erectile dysfunction, unspecified: Secondary | ICD-10-CM | POA: Diagnosis present

## 2017-10-14 DIAGNOSIS — D72829 Elevated white blood cell count, unspecified: Secondary | ICD-10-CM

## 2017-10-14 DIAGNOSIS — R451 Restlessness and agitation: Secondary | ICD-10-CM | POA: Diagnosis present

## 2017-10-14 DIAGNOSIS — D751 Secondary polycythemia: Secondary | ICD-10-CM | POA: Diagnosis present

## 2017-10-14 DIAGNOSIS — I255 Ischemic cardiomyopathy: Secondary | ICD-10-CM | POA: Diagnosis present

## 2017-10-14 DIAGNOSIS — J9690 Respiratory failure, unspecified, unspecified whether with hypoxia or hypercapnia: Secondary | ICD-10-CM

## 2017-10-14 DIAGNOSIS — Z8249 Family history of ischemic heart disease and other diseases of the circulatory system: Secondary | ICD-10-CM

## 2017-10-14 DIAGNOSIS — F05 Delirium due to known physiological condition: Secondary | ICD-10-CM | POA: Diagnosis present

## 2017-10-14 DIAGNOSIS — R402134 Coma scale, eyes open, to sound, 24 hours or more after hospital admission: Secondary | ICD-10-CM | POA: Diagnosis not present

## 2017-10-14 DIAGNOSIS — Z79899 Other long term (current) drug therapy: Secondary | ICD-10-CM

## 2017-10-14 DIAGNOSIS — E785 Hyperlipidemia, unspecified: Secondary | ICD-10-CM | POA: Diagnosis present

## 2017-10-14 DIAGNOSIS — I130 Hypertensive heart and chronic kidney disease with heart failure and stage 1 through stage 4 chronic kidney disease, or unspecified chronic kidney disease: Secondary | ICD-10-CM | POA: Diagnosis present

## 2017-10-14 DIAGNOSIS — I4891 Unspecified atrial fibrillation: Secondary | ICD-10-CM

## 2017-10-14 DIAGNOSIS — L97319 Non-pressure chronic ulcer of right ankle with unspecified severity: Secondary | ICD-10-CM | POA: Diagnosis present

## 2017-10-14 DIAGNOSIS — Z91040 Latex allergy status: Secondary | ICD-10-CM

## 2017-10-14 DIAGNOSIS — Z961 Presence of intraocular lens: Secondary | ICD-10-CM | POA: Diagnosis present

## 2017-10-14 DIAGNOSIS — I2510 Atherosclerotic heart disease of native coronary artery without angina pectoris: Secondary | ICD-10-CM | POA: Diagnosis present

## 2017-10-14 DIAGNOSIS — Z87891 Personal history of nicotine dependence: Secondary | ICD-10-CM

## 2017-10-14 DIAGNOSIS — Z8546 Personal history of malignant neoplasm of prostate: Secondary | ICD-10-CM

## 2017-10-14 DIAGNOSIS — M1990 Unspecified osteoarthritis, unspecified site: Secondary | ICD-10-CM | POA: Diagnosis present

## 2017-10-14 DIAGNOSIS — I509 Heart failure, unspecified: Secondary | ICD-10-CM

## 2017-10-14 DIAGNOSIS — Z9079 Acquired absence of other genital organ(s): Secondary | ICD-10-CM

## 2017-10-14 DIAGNOSIS — Z91041 Radiographic dye allergy status: Secondary | ICD-10-CM

## 2017-10-14 DIAGNOSIS — E11649 Type 2 diabetes mellitus with hypoglycemia without coma: Secondary | ICD-10-CM | POA: Diagnosis present

## 2017-10-14 NOTE — ED Triage Notes (Signed)
Patient coming [facility] from home. Per wife, patient is noncompliant on diabetes and afib medications. Patient left below the knee amputation, and right wound to lower leg.   Patient's heart rate ranged from 140-160 for EMS, Oxygen of 91% RA, CBG reading high initially, and again after 500 mL NaCL. Patient given 750 mL NaCl in total in transport.

## 2017-10-15 ENCOUNTER — Emergency Department: Payer: Medicare Other

## 2017-10-15 ENCOUNTER — Inpatient Hospital Stay
Admit: 2017-10-15 | Discharge: 2017-10-15 | Disposition: A | Discharge status: Home / Self Care | Payer: Medicare Other | Attending: Internal Medicine | Admitting: Internal Medicine

## 2017-10-15 ENCOUNTER — Other Ambulatory Visit: Payer: Self-pay

## 2017-10-15 ENCOUNTER — Inpatient Hospital Stay: Payer: Medicare Other

## 2017-10-15 DIAGNOSIS — I70238 Atherosclerosis of native arteries of right leg with ulceration of other part of lower right leg: Secondary | ICD-10-CM | POA: Diagnosis not present

## 2017-10-15 DIAGNOSIS — Z8546 Personal history of malignant neoplasm of prostate: Secondary | ICD-10-CM | POA: Diagnosis not present

## 2017-10-15 DIAGNOSIS — Z7189 Other specified counseling: Secondary | ICD-10-CM | POA: Diagnosis not present

## 2017-10-15 DIAGNOSIS — I130 Hypertensive heart and chronic kidney disease with heart failure and stage 1 through stage 4 chronic kidney disease, or unspecified chronic kidney disease: Secondary | ICD-10-CM | POA: Diagnosis present

## 2017-10-15 DIAGNOSIS — G9340 Encephalopathy, unspecified: Secondary | ICD-10-CM | POA: Diagnosis not present

## 2017-10-15 DIAGNOSIS — L97319 Non-pressure chronic ulcer of right ankle with unspecified severity: Secondary | ICD-10-CM | POA: Diagnosis present

## 2017-10-15 DIAGNOSIS — J9601 Acute respiratory failure with hypoxia: Secondary | ICD-10-CM

## 2017-10-15 DIAGNOSIS — M79604 Pain in right leg: Secondary | ICD-10-CM | POA: Diagnosis not present

## 2017-10-15 DIAGNOSIS — Z5329 Procedure and treatment not carried out because of patient's decision for other reasons: Secondary | ICD-10-CM | POA: Diagnosis present

## 2017-10-15 DIAGNOSIS — D72829 Elevated white blood cell count, unspecified: Secondary | ICD-10-CM | POA: Diagnosis not present

## 2017-10-15 DIAGNOSIS — E1010 Type 1 diabetes mellitus with ketoacidosis without coma: Secondary | ICD-10-CM | POA: Diagnosis not present

## 2017-10-15 DIAGNOSIS — Z89512 Acquired absence of left leg below knee: Secondary | ICD-10-CM | POA: Diagnosis not present

## 2017-10-15 DIAGNOSIS — Z91041 Radiographic dye allergy status: Secondary | ICD-10-CM | POA: Diagnosis not present

## 2017-10-15 DIAGNOSIS — I4891 Unspecified atrial fibrillation: Secondary | ICD-10-CM | POA: Diagnosis present

## 2017-10-15 DIAGNOSIS — I509 Heart failure, unspecified: Secondary | ICD-10-CM

## 2017-10-15 DIAGNOSIS — E785 Hyperlipidemia, unspecified: Secondary | ICD-10-CM | POA: Diagnosis present

## 2017-10-15 DIAGNOSIS — E1065 Type 1 diabetes mellitus with hyperglycemia: Secondary | ICD-10-CM

## 2017-10-15 DIAGNOSIS — Z888 Allergy status to other drugs, medicaments and biological substances status: Secondary | ICD-10-CM | POA: Diagnosis not present

## 2017-10-15 DIAGNOSIS — I5043 Acute on chronic combined systolic (congestive) and diastolic (congestive) heart failure: Secondary | ICD-10-CM | POA: Diagnosis present

## 2017-10-15 DIAGNOSIS — I482 Chronic atrial fibrillation: Principal | ICD-10-CM

## 2017-10-15 DIAGNOSIS — E1151 Type 2 diabetes mellitus with diabetic peripheral angiopathy without gangrene: Secondary | ICD-10-CM | POA: Diagnosis present

## 2017-10-15 DIAGNOSIS — Z9119 Patient's noncompliance with other medical treatment and regimen: Secondary | ICD-10-CM | POA: Diagnosis not present

## 2017-10-15 DIAGNOSIS — M1990 Unspecified osteoarthritis, unspecified site: Secondary | ICD-10-CM | POA: Diagnosis present

## 2017-10-15 DIAGNOSIS — I214 Non-ST elevation (NSTEMI) myocardial infarction: Secondary | ICD-10-CM

## 2017-10-15 DIAGNOSIS — I639 Cerebral infarction, unspecified: Secondary | ICD-10-CM | POA: Diagnosis not present

## 2017-10-15 DIAGNOSIS — I70235 Atherosclerosis of native arteries of right leg with ulceration of other part of foot: Secondary | ICD-10-CM | POA: Diagnosis present

## 2017-10-15 DIAGNOSIS — E1122 Type 2 diabetes mellitus with diabetic chronic kidney disease: Secondary | ICD-10-CM | POA: Diagnosis present

## 2017-10-15 DIAGNOSIS — I472 Ventricular tachycardia: Secondary | ICD-10-CM | POA: Diagnosis present

## 2017-10-15 DIAGNOSIS — I255 Ischemic cardiomyopathy: Secondary | ICD-10-CM | POA: Diagnosis not present

## 2017-10-15 DIAGNOSIS — G9341 Metabolic encephalopathy: Secondary | ICD-10-CM | POA: Diagnosis present

## 2017-10-15 DIAGNOSIS — N393 Stress incontinence (female) (male): Secondary | ICD-10-CM | POA: Diagnosis not present

## 2017-10-15 DIAGNOSIS — F05 Delirium due to known physiological condition: Secondary | ICD-10-CM | POA: Diagnosis present

## 2017-10-15 DIAGNOSIS — I5023 Acute on chronic systolic (congestive) heart failure: Secondary | ICD-10-CM | POA: Diagnosis not present

## 2017-10-15 DIAGNOSIS — I70269 Atherosclerosis of native arteries of extremities with gangrene, unspecified extremity: Secondary | ICD-10-CM | POA: Diagnosis not present

## 2017-10-15 DIAGNOSIS — M79605 Pain in left leg: Secondary | ICD-10-CM | POA: Diagnosis not present

## 2017-10-15 DIAGNOSIS — N183 Chronic kidney disease, stage 3 (moderate): Secondary | ICD-10-CM | POA: Diagnosis present

## 2017-10-15 DIAGNOSIS — Z515 Encounter for palliative care: Secondary | ICD-10-CM | POA: Diagnosis not present

## 2017-10-15 DIAGNOSIS — Z91040 Latex allergy status: Secondary | ICD-10-CM | POA: Diagnosis not present

## 2017-10-15 DIAGNOSIS — L97519 Non-pressure chronic ulcer of other part of right foot with unspecified severity: Secondary | ICD-10-CM | POA: Diagnosis present

## 2017-10-15 LAB — BRAIN NATRIURETIC PEPTIDE: B NATRIURETIC PEPTIDE 5: 459 pg/mL — AB (ref 0.0–100.0)

## 2017-10-15 LAB — PHOSPHORUS
Phosphorus: 3.8 mg/dL (ref 2.5–4.6)
Phosphorus: 3.9 mg/dL (ref 2.5–4.6)

## 2017-10-15 LAB — GLUCOSE, CAPILLARY
GLUCOSE-CAPILLARY: 512 mg/dL — AB (ref 65–99)
GLUCOSE-CAPILLARY: 234 mg/dL — AB (ref 65–99)
GLUCOSE-CAPILLARY: 318 mg/dL — AB (ref 65–99)
GLUCOSE-CAPILLARY: 300 mg/dL — AB (ref 65–99)
GLUCOSE-CAPILLARY: 193 mg/dL — AB (ref 65–99)
Glucose-Capillary: 578 mg/dL (ref 65–99)
Glucose-Capillary: 456 mg/dL — ABNORMAL HIGH (ref 65–99)
Glucose-Capillary: 312 mg/dL — ABNORMAL HIGH (ref 65–99)
Glucose-Capillary: 291 mg/dL — ABNORMAL HIGH (ref 65–99)
Glucose-Capillary: 233 mg/dL — ABNORMAL HIGH (ref 65–99)
Glucose-Capillary: 151 mg/dL — ABNORMAL HIGH (ref 65–99)
Glucose-Capillary: 158 mg/dL — ABNORMAL HIGH (ref 65–99)
Glucose-Capillary: 224 mg/dL — ABNORMAL HIGH (ref 65–99)
Glucose-Capillary: 304 mg/dL — ABNORMAL HIGH (ref 65–99)
Glucose-Capillary: 213 mg/dL — ABNORMAL HIGH (ref 65–99)
Glucose-Capillary: 160 mg/dL — ABNORMAL HIGH (ref 65–99)
Glucose-Capillary: 153 mg/dL — ABNORMAL HIGH (ref 65–99)
Glucose-Capillary: 137 mg/dL — ABNORMAL HIGH (ref 65–99)
Glucose-Capillary: 127 mg/dL — ABNORMAL HIGH (ref 65–99)

## 2017-10-15 LAB — CBC WITH DIFFERENTIAL/PLATELET
BASOS ABS: 0.1 10*3/uL (ref 0–0.1)
BASOS PCT: 1 %
EOS ABS: 0.1 10*3/uL (ref 0–0.7)
Eosinophils Relative: 1 %
HCT: 53 % — ABNORMAL HIGH (ref 40.0–52.0)
Hemoglobin: 17.5 g/dL (ref 13.0–18.0)
Lymphocytes Relative: 9 %
Lymphs Abs: 1.1 10*3/uL (ref 1.0–3.6)
MCH: 28 pg (ref 26.0–34.0)
MCHC: 32.9 g/dL (ref 32.0–36.0)
MCV: 84.9 fL (ref 80.0–100.0)
MONO ABS: 0.7 10*3/uL (ref 0.2–1.0)
MONOS PCT: 5 %
NEUTROS ABS: 10.9 10*3/uL — AB (ref 1.4–6.5)
NEUTROS PCT: 84 %
PLATELETS: 226 10*3/uL (ref 150–440)
RBC: 6.24 MIL/uL — ABNORMAL HIGH (ref 4.40–5.90)
RDW: 18.2 % — ABNORMAL HIGH (ref 11.5–14.5)
WBC: 12.9 10*3/uL — ABNORMAL HIGH (ref 3.8–10.6)

## 2017-10-15 LAB — BASIC METABOLIC PANEL WITH GFR
Anion gap: 9 (ref 5–15)
BUN: 30 mg/dL — ABNORMAL HIGH (ref 6–20)
CO2: 22 mmol/L (ref 22–32)
Calcium: 8.4 mg/dL — ABNORMAL LOW (ref 8.9–10.3)
Chloride: 103 mmol/L (ref 101–111)
Creatinine, Ser: 1.11 mg/dL (ref 0.61–1.24)
GFR calc Af Amer: >60 mL/min
GFR calc non Af Amer: >60 mL/min
Glucose, Bld: 331 mg/dL — ABNORMAL HIGH (ref 65–99)
Potassium: 4.2 mmol/L (ref 3.5–5.1)
Sodium: 134 mmol/L — ABNORMAL LOW (ref 135–145)

## 2017-10-15 LAB — BLOOD GAS, VENOUS
ACID-BASE DEFICIT: 7.4 mmol/L — AB (ref 0.0–2.0)
BICARBONATE: 18.2 mmol/L — AB (ref 20.0–28.0)
FIO2: 21
O2 SAT: 65.2 %
Patient temperature: 37
pCO2, Ven: 37 mmHg — ABNORMAL LOW (ref 44.0–60.0)
pH, Ven: 7.3 (ref 7.250–7.430)
pO2, Ven: 38 mmHg (ref 32.0–45.0)

## 2017-10-15 LAB — COMPREHENSIVE METABOLIC PANEL
ALT: 26 U/L (ref 17–63)
ANION GAP: 16 — AB (ref 5–15)
AST: 33 U/L (ref 15–41)
Albumin: 3.8 g/dL (ref 3.5–5.0)
Alkaline Phosphatase: 195 U/L — ABNORMAL HIGH (ref 38–126)
BUN: 28 mg/dL — ABNORMAL HIGH (ref 6–20)
CALCIUM: 8.9 mg/dL (ref 8.9–10.3)
CHLORIDE: 99 mmol/L — AB (ref 101–111)
CO2: 19 mmol/L — AB (ref 22–32)
CREATININE: 1.29 mg/dL — AB (ref 0.61–1.24)
GFR, EST AFRICAN AMERICAN: 59 mL/min — AB (ref >60)
GFR, EST NON AFRICAN AMERICAN: 51 mL/min — AB (ref >60)
Glucose, Bld: 686 mg/dL (ref 65–99)
Potassium: 4.7 mmol/L (ref 3.5–5.1)
SODIUM: 134 mmol/L — AB (ref 135–145)
Total Bilirubin: 1.1 mg/dL (ref 0.3–1.2)
Total Protein: 8.2 g/dL — ABNORMAL HIGH (ref 6.5–8.1)

## 2017-10-15 LAB — TROPONIN I
TROPONIN I: 0.11 ng/mL — AB (ref <0.03)
Troponin I: 17.51 ng/mL

## 2017-10-15 LAB — PROTIME-INR
INR: 1.13
INR: 1.14
PROTHROMBIN TIME: 14.4 s (ref 11.4–15.2)
Prothrombin Time: 14.5 seconds (ref 11.4–15.2)

## 2017-10-15 LAB — URINALYSIS, COMPLETE (UACMP) WITH MICROSCOPIC
BACTERIA UA: NONE SEEN
Bilirubin Urine: NEGATIVE
Glucose, UA: >=500 mg/dL — AB
Ketones, ur: 20 mg/dL — AB
Leukocytes, UA: NEGATIVE
NITRITE: NEGATIVE
Protein, ur: 30 mg/dL — AB
SPECIFIC GRAVITY, URINE: 1.027 (ref 1.005–1.030)
pH: 5 (ref 5.0–8.0)

## 2017-10-15 LAB — BETA-HYDROXYBUTYRIC ACID: BETA-HYDROXYBUTYRIC ACID: 2.8 mmol/L — AB (ref 0.05–0.27)

## 2017-10-15 LAB — DIGOXIN LEVEL: Digoxin Level: <0.2 ng/mL — ABNORMAL LOW (ref 0.8–2.0)

## 2017-10-15 LAB — HEPARIN LEVEL (UNFRACTIONATED): Heparin Unfractionated: 0.56 IU/mL (ref 0.30–0.70)

## 2017-10-15 LAB — MRSA PCR SCREENING: MRSA BY PCR: NEGATIVE

## 2017-10-15 LAB — MAGNESIUM
MAGNESIUM: 1.9 mg/dL (ref 1.7–2.4)
Magnesium: 1.8 mg/dL (ref 1.7–2.4)

## 2017-10-15 LAB — APTT
APTT: 27 s (ref 24–36)
APTT: 48 s — AB (ref 24–36)

## 2017-10-15 LAB — ETHANOL

## 2017-10-15 MED ORDER — DILTIAZEM HCL 100 MG IV SOLR
5.0000 mg/h | INTRAVENOUS | Status: DC
  Administered 2017-10-15: 5 mg/h via INTRAVENOUS
  Filled 2017-10-15 (×3): qty 100

## 2017-10-15 MED ORDER — ONDANSETRON HCL 4 MG/2ML IJ SOLN: 4.0000 mg | Freq: Four times a day (QID) | INTRAMUSCULAR | Status: DC | PRN

## 2017-10-15 MED ORDER — INSULIN REGULAR BOLUS VIA INFUSION
0.0000 [IU] | Freq: Three times a day (TID) | INTRAVENOUS | Status: DC
  Filled 2017-10-15: qty 10

## 2017-10-15 MED ORDER — SODIUM CHLORIDE 0.9 % IV SOLN
INTRAVENOUS | Status: DC
  Administered 2017-10-15: 04:00:00 via INTRAVENOUS

## 2017-10-15 MED ORDER — COLLAGENASE 250 UNIT/GM EX OINT
TOPICAL_OINTMENT | Freq: Every day | CUTANEOUS | Status: DC
  Administered 2017-10-16 – 2017-10-27 (×10): via TOPICAL
  Filled 2017-10-15: qty 30

## 2017-10-15 MED ORDER — SENNOSIDES-DOCUSATE SODIUM 8.6-50 MG PO TABS
1.0000 | ORAL_TABLET | Freq: Every evening | ORAL | Status: DC | PRN
  Administered 2017-10-20: 1 via ORAL
  Filled 2017-10-15: qty 1

## 2017-10-15 MED ORDER — LAMOTRIGINE 100 MG PO TABS
100.0000 mg | ORAL_TABLET | Freq: Two times a day (BID) | ORAL | Status: DC
  Administered 2017-10-15 – 2017-10-28 (×21): 100 mg via ORAL
  Filled 2017-10-15 (×3): qty 1
  Filled 2017-10-15: qty 4
  Filled 2017-10-15 (×4): qty 1
  Filled 2017-10-15: qty 4
  Filled 2017-10-15: qty 1
  Filled 2017-10-15 (×2): qty 4
  Filled 2017-10-15 (×4): qty 1
  Filled 2017-10-15 (×2): qty 4
  Filled 2017-10-15 (×3): qty 1
  Filled 2017-10-15 (×2): qty 4
  Filled 2017-10-15: qty 1

## 2017-10-15 MED ORDER — RIVAROXABAN 15 MG PO TABS: 15.0000 mg | ORAL_TABLET | Freq: Every day | ORAL | Status: DC

## 2017-10-15 MED ORDER — SODIUM CHLORIDE 0.9 % IV SOLN
INTRAVENOUS | Status: DC
  Administered 2017-10-15: 5.2 [IU]/h via INTRAVENOUS
  Filled 2017-10-15: qty 1

## 2017-10-15 MED ORDER — SODIUM CHLORIDE 0.9 % IV SOLN
INTRAVENOUS | Status: DC
  Administered 2017-10-15: 10:00:00 via INTRAVENOUS

## 2017-10-15 MED ORDER — DEXTROSE-NACL 5-0.45 % IV SOLN: INTRAVENOUS | Status: DC

## 2017-10-15 MED ORDER — DILTIAZEM HCL ER COATED BEADS 300 MG PO CP24: 300.0000 mg | ORAL_CAPSULE | Freq: Every day | ORAL | Status: DC

## 2017-10-15 MED ORDER — SODIUM CHLORIDE 0.9 % IV BOLUS
1000.0000 mL | Freq: Once | INTRAVENOUS | Status: AC
  Administered 2017-10-15: 1000 mL via INTRAVENOUS

## 2017-10-15 MED ORDER — DEXTROSE 50 % IV SOLN
25.0000 mL | INTRAVENOUS | Status: DC | PRN
  Filled 2017-10-15: qty 50

## 2017-10-15 MED ORDER — INSULIN ASPART 100 UNIT/ML IV SOLN: 5.0000 [IU] | Freq: Three times a day (TID) | INTRAVENOUS | Status: DC

## 2017-10-15 MED ORDER — MORPHINE SULFATE (PF) 2 MG/ML IV SOLN
2.0000 mg | INTRAVENOUS | Status: DC | PRN
  Administered 2017-10-16: 2 mg via INTRAVENOUS
  Filled 2017-10-15: qty 1

## 2017-10-15 MED ORDER — ACETAMINOPHEN 325 MG PO TABS
650.0000 mg | ORAL_TABLET | ORAL | Status: DC | PRN
  Administered 2017-10-17: 650 mg via ORAL
  Filled 2017-10-15: qty 2

## 2017-10-15 MED ORDER — FAMOTIDINE 20 MG PO TABS
20.0000 mg | ORAL_TABLET | Freq: Two times a day (BID) | ORAL | Status: DC
  Administered 2017-10-15 (×2): 20 mg via ORAL
  Filled 2017-10-15 (×2): qty 1

## 2017-10-15 MED ORDER — MORPHINE SULFATE (PF) 4 MG/ML IV SOLN
4.0000 mg | INTRAVENOUS | Status: DC | PRN
  Administered 2017-10-16: 2 mg via INTRAVENOUS
  Filled 2017-10-15: qty 1

## 2017-10-15 MED ORDER — DILTIAZEM HCL ER COATED BEADS 180 MG PO CP24
300.0000 mg | ORAL_CAPSULE | Freq: Every day | ORAL | Status: DC
  Administered 2017-10-15 – 2017-10-28 (×11): 300 mg via ORAL
  Filled 2017-10-15 (×12): qty 1

## 2017-10-15 MED ORDER — METOPROLOL TARTRATE 5 MG/5ML IV SOLN
2.5000 mg | Freq: Four times a day (QID) | INTRAVENOUS | Status: DC | PRN
  Administered 2017-10-20 – 2017-10-24 (×2): 2.5 mg via INTRAVENOUS
  Filled 2017-10-15 (×3): qty 5

## 2017-10-15 MED ORDER — DOCUSATE SODIUM 100 MG PO CAPS
100.0000 mg | ORAL_CAPSULE | Freq: Every day | ORAL | Status: DC
  Administered 2017-10-17 – 2017-10-20 (×3): 100 mg via ORAL
  Filled 2017-10-15 (×4): qty 1

## 2017-10-15 MED ORDER — METOPROLOL TARTRATE 5 MG/5ML IV SOLN
2.5000 mg | Freq: Once | INTRAVENOUS | Status: AC
  Administered 2017-10-15: 2.5 mg via INTRAVENOUS

## 2017-10-15 MED ORDER — INSULIN ASPART 100 UNIT/ML ~~LOC~~ SOLN
5.0000 [IU] | Freq: Three times a day (TID) | SUBCUTANEOUS | Status: DC
  Administered 2017-10-15: 5 [IU] via SUBCUTANEOUS

## 2017-10-15 MED ORDER — DIGOXIN 0.25 MG/ML IJ SOLN
0.5000 mg | Freq: Once | INTRAMUSCULAR | Status: AC
  Administered 2017-10-15: 0.5 mg via INTRAVENOUS
  Filled 2017-10-15: qty 2

## 2017-10-15 MED ORDER — INSULIN GLARGINE 100 UNIT/ML ~~LOC~~ SOLN
15.0000 [IU] | Freq: Every day | SUBCUTANEOUS | Status: DC
Start: 2017-10-15 — End: 2017-10-27
  Administered 2017-10-15 – 2017-10-25 (×9): 15 [IU] via SUBCUTANEOUS
  Filled 2017-10-15 (×13): qty 0.15

## 2017-10-15 MED ORDER — MORPHINE BOLUS VIA INFUSION: 2.0000 mg | Freq: Once | INTRAVENOUS | Status: DC

## 2017-10-15 MED ORDER — FUROSEMIDE 10 MG/ML IJ SOLN
40.0000 mg | Freq: Once | INTRAMUSCULAR | Status: AC
  Administered 2017-10-15: 40 mg via INTRAVENOUS
  Filled 2017-10-15: qty 4

## 2017-10-15 MED ORDER — DIGOXIN 125 MCG PO TABS
0.1250 mg | ORAL_TABLET | Freq: Every day | ORAL | Status: DC
  Administered 2017-10-17 – 2017-10-28 (×10): 0.125 mg via ORAL
  Filled 2017-10-15 (×13): qty 1

## 2017-10-15 MED ORDER — BISACODYL 5 MG PO TBEC: 5.0000 mg | DELAYED_RELEASE_TABLET | Freq: Every day | ORAL | Status: DC | PRN

## 2017-10-15 MED ORDER — METOPROLOL TARTRATE 5 MG/5ML IV SOLN
INTRAVENOUS | Status: AC
  Filled 2017-10-15: qty 5

## 2017-10-15 MED ORDER — ORAL CARE MOUTH RINSE
15.0000 mL | Freq: Two times a day (BID) | OROMUCOSAL | Status: DC
  Administered 2017-10-15 – 2017-10-24 (×13): 15 mL via OROMUCOSAL

## 2017-10-15 MED ORDER — METOPROLOL SUCCINATE ER 100 MG PO TB24
100.0000 mg | ORAL_TABLET | Freq: Every day | ORAL | Status: DC
  Administered 2017-10-15 – 2017-10-24 (×7): 100 mg via ORAL
  Filled 2017-10-15: qty 2
  Filled 2017-10-15 (×3): qty 1
  Filled 2017-10-15: qty 2
  Filled 2017-10-15: qty 1
  Filled 2017-10-15: qty 2
  Filled 2017-10-15: qty 1

## 2017-10-15 MED ORDER — DIGOXIN 0.25 MG/ML IJ SOLN
0.2500 mg | Freq: Once | INTRAMUSCULAR | Status: AC
  Administered 2017-10-15: 0.25 mg via INTRAVENOUS
  Filled 2017-10-15: qty 2

## 2017-10-15 MED ORDER — ATORVASTATIN CALCIUM 20 MG PO TABS
80.0000 mg | ORAL_TABLET | Freq: Every day | ORAL | Status: DC
  Administered 2017-10-17 – 2017-10-27 (×8): 80 mg via ORAL
  Filled 2017-10-15 (×8): qty 4

## 2017-10-15 MED ORDER — HEPARIN (PORCINE) IN NACL 100-0.45 UNIT/ML-% IJ SOLN
1000.0000 [IU]/h | INTRAMUSCULAR | Status: DC
  Administered 2017-10-15: 950 [IU]/h via INTRAVENOUS
  Administered 2017-10-16: 1250 [IU]/h via INTRAVENOUS
  Administered 2017-10-17 – 2017-10-18 (×2): 1000 [IU]/h via INTRAVENOUS
  Filled 2017-10-15 (×3): qty 250

## 2017-10-15 MED ORDER — MORPHINE SULFATE (PF) 2 MG/ML IV SOLN
2.0000 mg | Freq: Once | INTRAVENOUS | Status: AC
  Administered 2017-10-15: 2 mg via INTRAVENOUS
  Filled 2017-10-15: qty 1

## 2017-10-15 MED ORDER — ASPIRIN 81 MG PO CHEW
81.0000 mg | CHEWABLE_TABLET | Freq: Every day | ORAL | Status: DC
  Administered 2017-10-15 – 2017-10-28 (×11): 81 mg via ORAL
  Filled 2017-10-15 (×12): qty 1

## 2017-10-15 MED ORDER — SODIUM CHLORIDE 0.9 % IV SOLN
INTRAVENOUS | Status: DC
  Administered 2017-10-15: 2.4 [IU]/h via INTRAVENOUS
  Filled 2017-10-15: qty 1

## 2017-10-15 MED ORDER — FAMOTIDINE IN NACL 20-0.9 MG/50ML-% IV SOLN
20.0000 mg | Freq: Two times a day (BID) | INTRAVENOUS | Status: DC
  Administered 2017-10-15 (×2): 20 mg via INTRAVENOUS
  Filled 2017-10-15 (×2): qty 50

## 2017-10-15 MED ORDER — INSULIN GLARGINE 100 UNIT/ML ~~LOC~~ SOLN
15.0000 [IU] | Freq: Every day | SUBCUTANEOUS | Status: DC
  Filled 2017-10-15: qty 0.15

## 2017-10-15 NOTE — Progress Notes (Signed)
ANTICOAGULATION CONSULT NOTE - Initial Consult  Pharmacy Consult for heparin gtt Indication: chest pain/ACS  Allergies  Allergen Reactions  . Contrast Media [Iodinated Diagnostic Agents] Shortness Of Breath    SOB  . Iohexol Shortness Of Breath     Desc: Respiratory Distress, Laryngedema, diaphoresis, Onset Date: [id number]   . Metrizamide Shortness Of Breath    SOB SOB SOB   . Latex Rash    Patient Measurements: Height: 5\' 7"  (170.2 cm) Weight: 176 lb 12.9 oz (80.2 kg) IBW/kg (Calculated) : 66.1 Heparin Dosing Weight: 80.2kg  Vital Signs: Temp: 97.6 F (36.4 C) (05/25 1400) Temp Source: Oral (05/25 1400) BP: 121/89 (05/25 1430) Pulse Rate: 90 (05/25 1430)  Labs: Recent Labs    10/15/17 0008 10/15/17 1303 10/15/17 1309  HGB 17.5  --   --   HCT 53.0*  --   --   PLT 226  --   --   LABPROT 14.4  --   --   INR 1.13  --   --   CREATININE 1.29*  --  1.11  TROPONINI 0.11* 17.51*  --     Estimated Creatinine Clearance: 53.8 mL/min (by C-G formula based on SCr of 1.11 mg/dL).   Medical History: Past Medical History:  Diagnosis Date  . Arthritis   . Atrial fibrillation ([place])   . Carcinoma of prostate ([place])   . CHF (congestive heart failure) ([place])   . Chronic kidney disease   . Diabetes mellitus without complication ([place])   . ED (erectile dysfunction)   . Frequent urination   . Hyperlipidemia   . Hypertension   . Moderate mitral insufficiency   . Peripheral vascular disease ([place])   . Pneumonia 04/2016  . Prostate cancer ([place])   . Sleep apnea    OSA--USE C-PAP  . Ulcer of left foot due to type 2 diabetes mellitus ([place])   . Urinary stress incontinence, male     Medications:  Facility-Administered Medications Prior to Admission  Medication Dose Route Frequency Provider Last Rate Last Dose  . docusate sodium (COLACE) capsule 100 mg  100 mg Oral BID Stegmayer, Kimberly A, PA-C      . heparin injection 5,000 Units  5,000 Units Subcutaneous Q8H Stegmayer,  Kimberly A, PA-C      . piperacillin-tazobactam (ZOSYN) 3.375 g in dextrose 5 % 50 mL IVPB  3.375 g Intravenous Q6H Stegmayer, Kimberly A, PA-C      . senna (SENOKOT) tablet 8.6 mg  1 tablet Oral Daily PRN Stegmayer, Kimberly A, PA-C       Medications Prior to Admission  Medication Sig Dispense Refill Last Dose  . aspirin EC 81 MG tablet Take 81 mg by mouth daily.   Unknown at Unknown  . diltiazem (CARDIZEM CD) 240 MG 24 hr capsule Take 300 mg by mouth daily.    Unknown at Unknown  . docusate sodium (COLACE) 100 MG capsule Take 100 mg by mouth 2 (two) times daily.   Unknown at Unknown  . famotidine (PEPCID) 20 MG tablet Take 20 mg by mouth 2 (two) times daily.   Unknown at Unknown  . insulin aspart (NOVOLOG) 100 UNIT/ML injection Inject 0-5 Units into the skin 3 (three) times daily with meals.    Unknown at Unknown  . insulin glargine (LANTUS) 100 UNIT/ML injection Inject 0.1 mLs (10 Units total) into the skin at bedtime. 10 mL 11 Unknown at Unknown  . JARDIANCE 10 MG TABS tablet Take 10 mg by mouth daily.  Unknown at Unknown  . lamoTRIgine (LAMICTAL) 25 MG tablet Take 100 mg by mouth 2 (two) times daily.    Unknown at Unknown  . meloxicam (MOBIC) 7.5 MG tablet Take 7.5 mg by mouth daily.   Unknown at Unknown  . metFORMIN (GLUCOPHAGE) 1000 MG tablet TAKE 1 TABLET TWICE A DAY WITH MEALS   Unknown at Unknown  . naproxen sodium (ALEVE) 220 MG tablet Take 220 mg by mouth 2 (two) times daily as needed.   prn at prn  . oxyCODONE-acetaminophen (PERCOCET/ROXICET) 5-325 MG tablet Take 1 tablet by mouth every 6 (six) hours as needed for severe pain.   prn at prn  . simethicone (MYLICON) 80 MG chewable tablet Chew 80 mg by mouth every 6 (six) hours as needed for flatulence.   prn at prn  . atorvastatin (LIPITOR) 80 MG tablet Take 80 mg by mouth daily at 6 PM.    Not Taking at Unknown time  . digoxin (LANOXIN) 0.125 MG tablet Take 1 tablet (0.125 mg total) by mouth daily. (Patient not taking: Reported on  07/22/2017)   Not Taking  . furosemide (LASIX) 20 MG tablet Take 1 tablet (20 mg total) by mouth daily. (Patient not taking: Reported on 10/15/2017) 30 tablet 0 Not Taking  . lubiprostone (AMITIZA) 24 MCG capsule Take by mouth.   Not Taking at Unknown time  . mirabegron ER (MYRBETRIQ) 25 MG TB24 tablet Take 1 tablet (25 mg total) by mouth daily. (Patient not taking: Reported on 10/15/2017) 30 tablet 0 Not Taking  . oxyCODONE (OXY IR/ROXICODONE) 5 MG immediate release tablet Take 1 tablet (5 mg total) by mouth every 6 (six) hours as needed for moderate pain. (Patient not taking: Reported on 07/22/2017) 30 tablet 0 Not Taking  . oxyCODONE (OXYCONTIN) 10 mg 12 hr tablet Take 1 tablet (10 mg total) by mouth every 12 (twelve) hours. (Patient not taking: Reported on 07/22/2017) 14 tablet 0 Not Taking  . potassium chloride SA (K-DUR,KLOR-CON) 20 MEQ tablet Take by mouth.   Not Taking at Unknown time  . Rivaroxaban (XARELTO) 15 MG TABS tablet Take by mouth.   Not Taking at Unknown time  . silver sulfADIAZINE (SILVADENE) 1 % cream Apply topically daily. (Patient not taking: Reported on 10/15/2017) 50 g 0 Not Taking at Unknown time  . TOPROL XL 100 MG 24 hr tablet Take 100 mg by mouth daily.   Not Taking at Unknown time  . TRULICITY 0.63 KZ/6.0FU SOPN 0.75 mg once a week.   Not Taking at Unknown time   Scheduled:  . aspirin  81 mg Oral Daily  . atorvastatin  80 mg Oral q1800  . [START ON 10/16/2017] digoxin  0.125 mg Oral Daily  . diltiazem  300 mg Oral Daily  . insulin aspart  5 Units Subcutaneous TID WC  . insulin glargine  15 Units Subcutaneous QHS  . lamoTRIgine  100 mg Oral BID  . mouth rinse  15 mL Mouth Rinse BID  . metoprolol succinate  100 mg Oral Daily   Infusions:  . diltiazem (CARDIZEM) infusion Stopped (10/15/17 1052)  . famotidine (PEPCID) IV    . heparin     PRN: acetaminophen, bisacodyl, dextrose, metoprolol tartrate, morphine injection **OR** morphine injection, ondansetron (ZOFRAN) IV,  senna-docusate Anti-infectives (From admission, onward)   None      Assessment: 83 year old male requiring heparin gtt, previously on xarelto 20mg  po q1800 for afib.  Goal of Therapy:  Heparin level 0.3-0.7 units/ml aPTT 66-102 seconds  Monitor platelets by anticoagulation protocol: Yes   Plan:  Start heparin infusion at 950 units/hr Check anti-Xa level in 6 hours and daily while on heparin Continue to monitor H&H and platelets  [name] 10/15/2017,3:01 PM

## 2017-10-15 NOTE — Progress Notes (Signed)
Patient with increased work of breathing and diaphoretic.  Blood glucose level 318.  Dr. Celesta Aver notified of changes. MD ordered BiPAP to be placed back on patient. CXR ordered.

## 2017-10-15 NOTE — Progress Notes (Signed)
ANTICOAGULATION CONSULT NOTE - Initial Consult  Pharmacy Consult for heparin gtt Indication: chest pain/ACS  Allergies  Allergen Reactions  . Contrast Media [Iodinated Diagnostic Agents] Shortness Of Breath    SOB  . Iohexol Shortness Of Breath     Desc: Respiratory Distress, Laryngedema, diaphoresis, Onset Date: [id number]   . Metrizamide Shortness Of Breath    SOB SOB SOB   . Latex Rash    Patient Measurements: Height: 5\' 7"  (170.2 cm) Weight: 176 lb 12.9 oz (80.2 kg) IBW/kg (Calculated) : 66.1 Heparin Dosing Weight: 80.2kg  Vital Signs: Temp: 96.1 F (35.6 C) (05/25 2000) Temp Source: Axillary (05/25 2000) BP: 105/79 (05/25 2330) Pulse Rate: 95 (05/25 2330)  Labs: Recent Labs    10/15/17 0008 10/15/17 1303 10/15/17 1309 10/15/17 1541 10/15/17 2239  HGB 17.5  --   --   --   --   HCT 53.0*  --   --   --   --   PLT 226  --   --   --   --   APTT  --   --   --  27 48*  LABPROT 14.4  --   --  14.5  --   INR 1.13  --   --  1.14  --   HEPARINUNFRC  --   --   --   --  0.56  CREATININE 1.29*  --  1.11  --   --   TROPONINI 0.11* 17.51*  --   --   --     Estimated Creatinine Clearance: 53.8 mL/min (by C-G formula based on SCr of 1.11 mg/dL).   Medical History: Past Medical History:  Diagnosis Date  . Arthritis   . Atrial fibrillation ([place])   . Carcinoma of prostate ([place])   . CHF (congestive heart failure) ([place])   . Chronic kidney disease   . Diabetes mellitus without complication ([place])   . ED (erectile dysfunction)   . Frequent urination   . Hyperlipidemia   . Hypertension   . Moderate mitral insufficiency   . Peripheral vascular disease ([place])   . Pneumonia 04/2016  . Prostate cancer ([place])   . Sleep apnea    OSA--USE C-PAP  . Ulcer of left foot due to type 2 diabetes mellitus ([place])   . Urinary stress incontinence, male     Medications:  Facility-Administered Medications Prior to Admission  Medication Dose Route Frequency Provider Last Rate Last  Dose  . docusate sodium (COLACE) capsule 100 mg  100 mg Oral BID Stegmayer, Kimberly A, PA-C      . heparin injection 5,000 Units  5,000 Units Subcutaneous Q8H Stegmayer, Kimberly A, PA-C      . piperacillin-tazobactam (ZOSYN) 3.375 g in dextrose 5 % 50 mL IVPB  3.375 g Intravenous Q6H Stegmayer, Kimberly A, PA-C      . senna (SENOKOT) tablet 8.6 mg  1 tablet Oral Daily PRN Stegmayer, Kimberly A, PA-C       Medications Prior to Admission  Medication Sig Dispense Refill Last Dose  . aspirin EC 81 MG tablet Take 81 mg by mouth daily.   Unknown at Unknown  . diltiazem (CARDIZEM CD) 240 MG 24 hr capsule Take 300 mg by mouth daily.    Unknown at Unknown  . docusate sodium (COLACE) 100 MG capsule Take 100 mg by mouth 2 (two) times daily.   Unknown at Unknown  . famotidine (PEPCID) 20 MG tablet Take 20 mg by mouth 2 (two) times daily.  Unknown at Unknown  . insulin aspart (NOVOLOG) 100 UNIT/ML injection Inject 0-5 Units into the skin 3 (three) times daily with meals.    Unknown at Unknown  . insulin glargine (LANTUS) 100 UNIT/ML injection Inject 0.1 mLs (10 Units total) into the skin at bedtime. 10 mL 11 Unknown at Unknown  . JARDIANCE 10 MG TABS tablet Take 10 mg by mouth daily.    Unknown at Unknown  . lamoTRIgine (LAMICTAL) 25 MG tablet Take 100 mg by mouth 2 (two) times daily.    Unknown at Unknown  . meloxicam (MOBIC) 7.5 MG tablet Take 7.5 mg by mouth daily.   Unknown at Unknown  . metFORMIN (GLUCOPHAGE) 1000 MG tablet TAKE 1 TABLET TWICE A DAY WITH MEALS   Unknown at Unknown  . naproxen sodium (ALEVE) 220 MG tablet Take 220 mg by mouth 2 (two) times daily as needed.   prn at prn  . oxyCODONE-acetaminophen (PERCOCET/ROXICET) 5-325 MG tablet Take 1 tablet by mouth every 6 (six) hours as needed for severe pain.   prn at prn  . simethicone (MYLICON) 80 MG chewable tablet Chew 80 mg by mouth every 6 (six) hours as needed for flatulence.   prn at prn  . atorvastatin (LIPITOR) 80 MG tablet Take 80 mg  by mouth daily at 6 PM.    Not Taking at Unknown time  . digoxin (LANOXIN) 0.125 MG tablet Take 1 tablet (0.125 mg total) by mouth daily. (Patient not taking: Reported on 07/22/2017)   Not Taking  . furosemide (LASIX) 20 MG tablet Take 1 tablet (20 mg total) by mouth daily. (Patient not taking: Reported on 10/15/2017) 30 tablet 0 Not Taking  . lubiprostone (AMITIZA) 24 MCG capsule Take by mouth.   Not Taking at Unknown time  . mirabegron ER (MYRBETRIQ) 25 MG TB24 tablet Take 1 tablet (25 mg total) by mouth daily. (Patient not taking: Reported on 10/15/2017) 30 tablet 0 Not Taking  . oxyCODONE (OXY IR/ROXICODONE) 5 MG immediate release tablet Take 1 tablet (5 mg total) by mouth every 6 (six) hours as needed for moderate pain. (Patient not taking: Reported on 07/22/2017) 30 tablet 0 Not Taking  . oxyCODONE (OXYCONTIN) 10 mg 12 hr tablet Take 1 tablet (10 mg total) by mouth every 12 (twelve) hours. (Patient not taking: Reported on 07/22/2017) 14 tablet 0 Not Taking  . potassium chloride SA (K-DUR,KLOR-CON) 20 MEQ tablet Take by mouth.   Not Taking at Unknown time  . Rivaroxaban (XARELTO) 15 MG TABS tablet Take by mouth.   Not Taking at Unknown time  . silver sulfADIAZINE (SILVADENE) 1 % cream Apply topically daily. (Patient not taking: Reported on 10/15/2017) 50 g 0 Not Taking at Unknown time  . TOPROL XL 100 MG 24 hr tablet Take 100 mg by mouth daily.   Not Taking at Unknown time  . TRULICITY 4.09 WJ/1.9JY SOPN 0.75 mg once a week.   Not Taking at Unknown time   Scheduled:  . aspirin  81 mg Oral Daily  . atorvastatin  80 mg Oral q1800  . collagenase   Topical Daily  . [START ON 10/16/2017] digoxin  0.125 mg Oral Daily  . diltiazem  300 mg Oral Daily  . docusate sodium  100 mg Oral Daily  . insulin glargine  15 Units Subcutaneous QHS  . lamoTRIgine  100 mg Oral BID  . mouth rinse  15 mL Mouth Rinse BID  . metoprolol succinate  100 mg Oral Daily   Infusions:  .  diltiazem (CARDIZEM) infusion Stopped  (10/15/17 1052)  . famotidine (PEPCID) IV Stopped (10/15/17 2240)  . heparin 950 Units/hr (10/15/17 1915)  . insulin (NOVOLIN-R) infusion 0.7 Units/hr (10/15/17 2315)   PRN: acetaminophen, bisacodyl, dextrose, metoprolol tartrate, morphine injection **OR** morphine injection, ondansetron (ZOFRAN) IV, senna-docusate Anti-infectives (From admission, onward)   None      Assessment: 83 year old male requiring heparin gtt, previously on xarelto 20mg  po q1800 for afib.  Goal of Therapy:  Heparin level 0.3-0.7 units/ml aPTT 66-102 seconds Monitor platelets by anticoagulation protocol: Yes   Plan:  05/25 @ 2239 HL 0.56 therapeutic. Will continue rate @ 950 units/hr and will recheck @ 0800.  Tobie Lords, PharmD, BCPS Clinical Pharmacist 10/15/2017

## 2017-10-15 NOTE — Consult Note (Signed)
PULMONARY / CRITICAL CARE MEDICINE   Name: [name] MRN: [id number] DOB: 06-20-1934    ADMISSION DATE:  10/14/2017   CONSULTATION DATE:  10/15/2017  REFERRING MD:  Dr. Jodell Cipro  REASON: HHS and Afib with RVR  HISTORY OF PRESENT ILLNESS:   This is an 82 y/o male with a medical history as indicated below who presented to the ED with complaints of shortness of breath and tachycardia.  He was found to be in A. fib with RVR.  His chest x-ray showed mild pulmonary vascular congestion.  His labs showed a blood glucose level of 686 mg/dL.  He was started on an insulin drip and admitted to the ICU for further management.  Patient reports that he has not been completely compliant with his treatment regimen.  He denies chest pain palpitations nausea vomiting diarrhea  PAST MEDICAL HISTORY :  He  has a past medical history of Arthritis, Atrial fibrillation ([place]), Carcinoma of prostate ([place]), CHF (congestive heart failure) ([place]), Chronic kidney disease, Diabetes mellitus without complication ([place]), ED (erectile dysfunction), Frequent urination, Hyperlipidemia, Hypertension, Moderate mitral insufficiency, Peripheral vascular disease ([place]), Pneumonia (04/2016), Prostate cancer [facility]), Sleep apnea, Ulcer of left foot due to type 2 diabetes mellitus ([place]), and Urinary stress incontinence, male.  PAST SURGICAL HISTORY: He  has a past surgical history that includes Prostate surgery; Tonsillectomy; Eye surgery (Bilateral); Cholecystectomy; Lower Extremity Angiography (Left, 08/16/2016); Lower Extremity Angiography (Left, 09/01/2016); LOWER EXTREMITY INTERVENTION (09/01/2016); Irrigation and debridement foot (Left, 10/15/2016); Apligraft placement (Left, 10/15/2016); Lower Extremity Angiography (Left, 10/14/2016); Irrigation and debridement foot (Left, 12/09/2016); Lower Extremity Angiography (Left, 12/20/2016); and Amputation (Left, 01/12/2017).  Allergies  Allergen Reactions  . Contrast Media [Iodinated  Diagnostic Agents] Shortness Of Breath    SOB  . Iohexol Shortness Of Breath     Desc: Respiratory Distress, Laryngedema, diaphoresis, Onset Date: [id number]   . Metrizamide Shortness Of Breath    SOB SOB SOB   . Latex Rash    Current Facility-Administered Medications on File Prior to Encounter  Medication  . acetaminophen (TYLENOL) tablet 650 mg   Or  . acetaminophen (TYLENOL) suppository 650 mg  . ondansetron (ZOFRAN) tablet 4 mg   Or  . ondansetron (ZOFRAN) 4 mg in sodium chloride 0.9 % 50 mL IVPB   Current Outpatient Medications on File Prior to Encounter  Medication Sig  . aspirin EC 81 MG tablet Take 81 mg by mouth daily.  Marland Kitchen diltiazem (CARDIZEM CD) 240 MG 24 hr capsule Take 300 mg by mouth daily.   Marland Kitchen docusate sodium (COLACE) 100 MG capsule Take 100 mg by mouth 2 (two) times daily.  . famotidine (PEPCID) 20 MG tablet Take 20 mg by mouth 2 (two) times daily.  . insulin aspart (NOVOLOG) 100 UNIT/ML injection Inject 0-5 Units into the skin 3 (three) times daily with meals.   . insulin glargine (LANTUS) 100 UNIT/ML injection Inject 0.1 mLs (10 Units total) into the skin at bedtime.  Marland Kitchen JARDIANCE 10 MG TABS tablet Take 10 mg by mouth daily.   Marland Kitchen lamoTRIgine (LAMICTAL) 25 MG tablet Take 100 mg by mouth 2 (two) times daily.   . meloxicam (MOBIC) 7.5 MG tablet Take 7.5 mg by mouth daily.  . metFORMIN (GLUCOPHAGE) 1000 MG tablet TAKE 1 TABLET TWICE A DAY WITH MEALS  . naproxen sodium (ALEVE) 220 MG tablet Take 220 mg by mouth 2 (two) times daily as needed.  Marland Kitchen oxyCODONE-acetaminophen (PERCOCET/ROXICET) 5-325 MG tablet Take 1 tablet by mouth every 6 (six) hours  as needed for severe pain.  . simethicone (MYLICON) 80 MG chewable tablet Chew 80 mg by mouth every 6 (six) hours as needed for flatulence.  Marland Kitchen atorvastatin (LIPITOR) 80 MG tablet Take 80 mg by mouth daily at 6 PM.   . digoxin (LANOXIN) 0.125 MG tablet Take 1 tablet (0.125 mg total) by mouth daily. (Patient not taking: Reported on  07/22/2017)  . furosemide (LASIX) 20 MG tablet Take 1 tablet (20 mg total) by mouth daily. (Patient not taking: Reported on 10/15/2017)  . lubiprostone (AMITIZA) 24 MCG capsule Take by mouth.  . mirabegron ER (MYRBETRIQ) 25 MG TB24 tablet Take 1 tablet (25 mg total) by mouth daily. (Patient not taking: Reported on 10/15/2017)  . oxyCODONE (OXY IR/ROXICODONE) 5 MG immediate release tablet Take 1 tablet (5 mg total) by mouth every 6 (six) hours as needed for moderate pain. (Patient not taking: Reported on 07/22/2017)  . oxyCODONE (OXYCONTIN) 10 mg 12 hr tablet Take 1 tablet (10 mg total) by mouth every 12 (twelve) hours. (Patient not taking: Reported on 07/22/2017)  . potassium chloride SA (K-DUR,KLOR-CON) 20 MEQ tablet Take by mouth.  . Rivaroxaban (XARELTO) 15 MG TABS tablet Take by mouth.  . silver sulfADIAZINE (SILVADENE) 1 % cream Apply topically daily. (Patient not taking: Reported on 10/15/2017)  . TOPROL XL 100 MG 24 hr tablet Take 100 mg by mouth daily.  . TRULICITY 3.29 JJ/8.8CZ SOPN 0.75 mg once a week.    FAMILY HISTORY:  His indicated that his mother is deceased. He indicated that his father is deceased. He indicated that the status of his neg hx is unknown.   SOCIAL HISTORY: He  reports that he quit smoking about 23 years ago. His smoking use included cigarettes. He smoked 0.25 packs per day. He has never used smokeless tobacco. He reports that he does not drink alcohol or use drugs.  REVIEW OF SYSTEMS:   Constitutional: Negative for fever and chills.  HENT: Negative for congestion and rhinorrhea.  Eyes: Negative for redness and visual disturbance.  Respiratory: Negative for shortness of breath and wheezing.  Cardiovascular: Negative for chest pain and palpitations.  Gastrointestinal: Negative  for nausea , vomiting and abdominal pain and  Loose stools Genitourinary: Negative for dysuria and urgency.  Endocrine: Denies polyuria, polyphagia and heat intolerance Musculoskeletal:  Negative for myalgias and arthralgias.  Skin: Negative for pallor and wound.  Neurological: Negative for dizziness and headaches   SUBJECTIVE:   VITAL SIGNS: BP (!) 137/112   Pulse 85   Temp (!) 97.5 F (36.4 C) (Oral)   Resp (!) 32   Wt 180 lb (81.6 kg)   SpO2 98%   BMI 28.19 kg/m   HEMODYNAMICS:    VENTILATOR SETTINGS:    INTAKE / OUTPUT: No intake/output data recorded.  PHYSICAL EXAMINATION: General: No acute distress Neuro: Alert and oriented x3, no focal deficits HEENT: PERRLA Cardiovascular: Apical pulse irregular, tachycardic, S1-S2, no murmur regurg or gallop, +2 pulses bilaterally Lungs: Bilateral breath sounds without any wheezes or rhonchi, diminished in the bases Abdomen: Nondistended, normal bowel sounds in all 4 quadrants Musculoskeletal:  +ROM, no deformities Skin: warm and dry  LABS:  BMET Recent Labs  Lab 10/15/17 0008  NA 134*  K 4.7  CL 99*  CO2 19*  BUN 28*  CREATININE 1.29*  GLUCOSE 686*    Electrolytes Recent Labs  Lab 10/15/17 0008  CALCIUM 8.9  MG 1.9  PHOS 3.8    CBC Recent Labs  Lab 10/15/17 0008  WBC 12.9*  HGB 17.5  HCT 53.0*  PLT 226    Coag's Recent Labs  Lab 10/15/17 0008  INR 1.13    Sepsis Markers No results for input(s): LATICACIDVEN, PROCALCITON, O2SATVEN in the last 168 hours.  ABG No results for input(s): PHART, PCO2ART, PO2ART in the last 168 hours.  Liver Enzymes Recent Labs  Lab 10/15/17 0008  AST 33  ALT 26  ALKPHOS 195*  BILITOT 1.1  ALBUMIN 3.8    Cardiac Enzymes Recent Labs  Lab 10/15/17 0008  TROPONINI 0.11*    Glucose Recent Labs  Lab 10/15/17 0018 10/15/17 0119 10/15/17 0233  GLUCAP >600* 578* 512*    Imaging Dg Chest Port 1 View  Result Date: 10/15/2017 CLINICAL DATA:  Wound to the right lower leg, AFib EXAM: PORTABLE CHEST 1 VIEW COMPARISON:  01/12/2017, 06/03/2016 FINDINGS: Mild cardiomegaly. Diffuse increased interstitial opacity, suspicious for acute  interstitial inflammation or edema. No large effusion. Aortic atherosclerosis. No pneumothorax. IMPRESSION: 1. Cardiomegaly with vascular congestion and mild to moderate diffuse increased interstitial opacity, favored to represent interstitial edema. 2. No significant effusion or focal airspace disease. Electronically Signed   By: Donavan Foil M.D.   On: 10/15/2017 01:00   SIGNIFICANT EVENTS: 10/15/2017: admitted  LINES/TUBES: PIV's  ASSESSMENT  Uncontrolled type 2 diabetes with HHS A. fib with RVR Acute on chronic renal failure Chronic diastolic heart failure Elevated troponin likely due to demand ischemia History of prostate cancer, sleep apnea, hypertension, hyperlipidemia, and peripheral vascular disease status post left BKA  PLAN IV insulin per protocol Gentle IV hydration Diltiazem infusion and relay with oral diltiazem for rate control Resume all home medications For medication adherence reviewed with patient GI and DVT prophylaxis  FAMILY  - Updates: Ivin Booty updated on current treatment plan   Naveyah Iacovelli S. [facility] ANP-BC Pulmonary and Critical Care Medicine [facility] Pager [phone] or [phone]  NB: This document was prepared using Dragon voice recognition software and may include unintentional dictation errors.    10/15/2017, 3:57 AM

## 2017-10-15 NOTE — Therapy (Addendum)
[facility] [address] [place], [place], [address] Phone: [phone]   Fax:  [phone]  Physical Therapy Treatment  Patient Details  Name: [name] MRN: [id number] Date of Birth: July 27, 1934 Referring Provider: Dr. Leotis Pain   Encounter Date: 10/13/2017    Treatment: 26 of 28.  Recert date: 8/33/82 5053 to 17     Past Medical History:  Diagnosis Date  . Arthritis   . Atrial fibrillation ([place])   . Carcinoma of prostate ([place])   . CHF (congestive heart failure) ([place])   . Chronic kidney disease   . Diabetes mellitus without complication ([place])   . ED (erectile dysfunction)   . Frequent urination   . Hyperlipidemia   . Hypertension   . Moderate mitral insufficiency   . Peripheral vascular disease ([place])   . Pneumonia 04/2016  . Prostate cancer ([place])   . Sleep apnea    OSA--USE C-PAP  . Ulcer of left foot due to type 2 diabetes mellitus ([place])   . Urinary stress incontinence, male     Past Surgical History:  Procedure Laterality Date  . AMPUTATION Left 01/12/2017   Procedure: AMPUTATION BELOW KNEE;  Surgeon: Algernon Huxley, MD;  Location: [facility] ORS;  Service: General;  Laterality: Left;  . APLIGRAFT PLACEMENT Left 10/15/2016   Procedure: APLIGRAFT PLACEMENT;  Surgeon: Albertine Patricia, DPM;  Location: [facility] ORS;  Service: Podiatry;  Laterality: Left;  necrotic ulcer  . CHOLECYSTECTOMY    . EYE SURGERY Bilateral    Cataract Extraction with IOL  . IRRIGATION AND DEBRIDEMENT FOOT Left 10/15/2016   Procedure: IRRIGATION AND DEBRIDEMENT FOOT-EXCISIONAL DEBRIDEMENT OF SKIN 3RD, 4TH AND 5TH TOES WITH APPLICATION OF APLIGRAFT;  Surgeon: Albertine Patricia, DPM;  Location: [facility] ORS;  Service: Podiatry;  Laterality: Left;  necrotic,gangrene  . IRRIGATION AND DEBRIDEMENT FOOT Left 12/09/2016   Procedure: IRRIGATION AND DEBRIDEMENT FOOT-MUSCLE/FASCIA, RESECTION OF FOURTH AND FIFTH METATARSAL NECROTIC BONE;  Surgeon: Albertine Patricia, DPM;  Location: [facility] ORS;  Service: Podiatry;  Laterality: Left;  . LOWER EXTREMITY ANGIOGRAPHY Left 08/16/2016   Procedure: Lower Extremity Angiography;  Surgeon: Algernon Huxley, MD;  Location: [place] [facility];  Service: Cardiovascular;  Laterality: Left;  . LOWER EXTREMITY ANGIOGRAPHY Left 09/01/2016   Procedure: Lower Extremity Angiography;  Surgeon: Algernon Huxley, MD;  Location: [place] [facility];  Service: Cardiovascular;  Laterality: Left;  . LOWER EXTREMITY ANGIOGRAPHY Left 10/14/2016   Procedure: Lower Extremity Angiography;  Surgeon: Algernon Huxley, MD;  Location: [place] [facility];  Service: Cardiovascular;  Laterality: Left;  . LOWER EXTREMITY ANGIOGRAPHY Left 12/20/2016   Procedure: Lower Extremity Angiography;  Surgeon: Algernon Huxley, MD;  Location: [place] [facility];  Service: Cardiovascular;  Laterality: Left;  . LOWER EXTREMITY INTERVENTION  09/01/2016   Procedure: Lower Extremity Intervention;  Surgeon: Algernon Huxley, MD;  Location: [place] [facility];  Service: Cardiovascular;;  . PROSTATE SURGERY     removal  . TONSILLECTOMY     as a child    There were no vitals filed for this visit.    Pt. Has bandage on R lower leg past 7 days.  No c/o pain.  Pt. Drove truck to PT clinic today.           There.:  Scifit L6 10 min. B UE/LE. (no charge) Standing hip ex. Program.  CGA for safety at //-bars. Sit to stands from green chair with min. To no UE assist.  Neuro:  Airex tasks/ wt. Shifting. (limited UE assist in //-bars) 2nd step touches/ proprioceptive tasks Walking in //-bars/ clinic with no assistive device. Recip. Stair climbing/ descending with B UE to 1 UE assist safely.  Resisted gait pattern1-2BTB 5x all 4-planes.             CGA/ min. A for safety and verbal cuing during all gait and balance training.  Pt. benefits from the visual cuing of the agility ladder to improve consistent step/gait pattern.  1 UE required with 2nd step  touches/ repetitive hip flexion with proprioception activities.  Pt. requires extra time to find center of balance with Airex balance tasks.      PT Long Term Goals - 10/07/17 0741      PT LONG TERM GOAL #1   Title  Pt. able to transfer from w/c to least assistive device with mod. I and use of prosthesis safety.      Baseline  mod. I with use of RW or SPC    Time  4    Period  Weeks    Status  Achieved    Target Date  07/26/17      PT LONG TERM GOAL #2   Title  Pt. will increase FOTO to 49 to improve functional mobility.      Baseline  FOTO on 2/6: 34.  3/4: 49    Time  4    Period  Weeks    Status  Achieved    Target Date  07/26/17      PT LONG TERM GOAL #3   Title  Pt. able to don/ doff L prosthesis with mod. I prior to PT tx. session to promote ability to walk to car/ ambulate into PT clinic.      Baseline  mod. I for extra time.     Time  4    Period  Weeks    Status  Achieved    Target Date  07/26/17      PT LONG TERM GOAL #4   Title  Pt. able to ambulate 300 feet with use of least assistive device and prosthesis to improve safe functional mobility.      Baseline  450 feet with use of SPC around PT building/ complex with mod. I with good step pattern/ cadence.     Time  4    Period  Weeks    Status  Achieved    Target Date  08/25/17      PT LONG TERM GOAL #5   Title  Pt. able to amb. up/down ramp at home with mod. I and use of prosthesis safely.      Baseline  mod independent with use of SPC and ramp at home.     Time  4    Period  Weeks    Status  Achieved    Target Date  08/25/17      PT LONG TERM GOAL #6   Title  Pt. able to amb. community distance with use of SPC on level surfaces safety with no LOB.     Baseline  pt. able to amb. with SPC and SBA/mod. I for safety outside.  PT working on pt. endurance/ pushing cart at store    Time  4    Period  Weeks    Status  Partially Met    Target Date  11/03/17      PT LONG TERM GOAL #7   Title  Pt. able to  stand  from low chair/ commode with no UE assist safely.      Baseline  Extra time/ several attempts to stand from low chair with use of prosthesis.      Time  4    Period  Weeks    Status  On-going    Target Date  11/03/17      PT LONG TERM GOAL #8   Title  Pt. will be able to ambulate in yard/ uneven terrain with use of least assistive device and no LOB safely to allow pt. to complete yard work/ mowing.     Baseline  Pt. using SPC to ambulate to riding mower.  Pt. hesitant to ambulate around yard due to fear of falling.      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 NSTEMI (non-ST elevated myocardial infarction) ([place]) 01/16/2018  . Acute CHF (congestive heart failure) ([place]) 11/23/2017  . Atherosclerosis of native arteries of the extremities with ulceration ([place]) 11/18/2017  . Atrial fibrillation with rapid ventricular response ([place]) 10/15/2017  . BKA stump complication ([place]) 81/27/5170  . 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 S/P BKA (below knee amputation) unilateral, left ([place]) 02/01/2017  . Chronic diastolic heart failure ([place]) 01/21/2017  . Pressure injury of skin 01/18/2017  . 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 Diabetic foot ulcer ([place]) 12/29/2016  . Ataxia  10/21/2016  . History of femoral angiogram 09/06/2016  . Left leg pain 09/06/2016  . Leukocytosis 09/06/2016  . Generalized weakness 09/06/2016  . A-fib ([place]) 08/30/2016  . Pneumonia 05/19/2016  . Hyperlipidemia 04/20/2016  . Back pain 04/19/2016  . Type 2 diabetes mellitus treated with insulin ([place]) 04/19/2016  . Essential hypertension 04/19/2016  . PAD (peripheral artery disease) ([place]) 04/19/2016  . Erectile dysfunction following radical prostatectomy 06/25/2015  . History of prostate cancer 12/23/2014  . Incontinence 12/23/2014   Pura Spice, PT, DPT # [phone] 10/31/2017, 9:32 PM  [facility] [address] [place], [place], [address] Phone: [phone]   Fax:  [phone]  Name: [name] MRN: [id number] Date of Birth: 07-18-1934

## 2017-10-15 NOTE — Progress Notes (Signed)
Pt transported to [facility] without any incident ,

## 2017-10-15 NOTE — ED Provider Notes (Signed)
[facility] Emergency Department Provider Note  ____________________________________________   First MD Initiated Contact with Patient 10/15/17 0001     (approximate)  I have reviewed the triage vital signs and the nursing notes.   HISTORY  Chief Complaint Tachycardia and Hyperglycemia  Level 5 exemption history limited by the patient's clinical condition  HPI [name] is a 83 y.o. male who comes to the emergency department via EMS for palpitations and respiratory distress.  The patient has a long-standing history of diabetes mellitus as well as atrial fibrillation however according to his wife he has been noncompliant with his medications.  He has felt "sick" for the past week or so.  EMS noted atrial fibrillation with rapid ventricular response between 170 and 200.  They gave about 700 cc of normal saline in route and placed him on nasal cannula.  Past Medical History:  Diagnosis Date  . Arthritis   . Atrial fibrillation ([place])   . Carcinoma of prostate ([place])   . CHF (congestive heart failure) ([place])   . Chronic kidney disease   . Diabetes mellitus without complication ([place])   . ED (erectile dysfunction)   . Frequent urination   . Hyperlipidemia   . Hypertension   . Moderate mitral insufficiency   . Peripheral vascular disease ([place])   . Pneumonia 04/2016  . Prostate cancer ([place])   . Sleep apnea    OSA--USE C-PAP  . Ulcer of left foot due to type 2 diabetes mellitus ([place])   . Urinary stress incontinence, male     Patient Active Problem List   Diagnosis Date Noted  . Atrial fibrillation with rapid ventricular response ([place]) 10/15/2017  . BKA stump complication ([place]) 29/52/8413  . 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 S/P BKA (below knee amputation) unilateral, left ([place]) 02/01/2017  . Chronic diastolic heart failure ([place]) 01/21/2017  . Pressure injury of skin 01/18/2017  . 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 Diabetic foot ulcer ([place]) 12/29/2016  . Ataxia 10/21/2016  . History of femoral angiogram 09/06/2016  . Left leg pain 09/06/2016  . Leukocytosis 09/06/2016  . Generalized weakness 09/06/2016  . A-fib ([place]) 08/30/2016  . Pneumonia 05/19/2016  . Hyperlipidemia 04/20/2016  . Back pain 04/19/2016  . Type 2 diabetes mellitus treated with insulin ([place]) 04/19/2016  . Essential hypertension 04/19/2016  . PAD (peripheral artery disease) ([place]) 04/19/2016  . Erectile dysfunction following radical prostatectomy 06/25/2015  . History of prostate cancer 12/23/2014  . Incontinence 12/23/2014    Past Surgical History:  Procedure Laterality Date  . AMPUTATION Left 01/12/2017   Procedure: AMPUTATION BELOW KNEE;  Surgeon: Algernon Huxley, MD;  Location: [facility] ORS;  Service: General;  Laterality: Left;  . APLIGRAFT PLACEMENT Left 10/15/2016   Procedure: APLIGRAFT PLACEMENT;  Surgeon: Albertine Patricia, DPM;  Location: [facility] ORS;  Service: Podiatry;  Laterality: Left;  necrotic ulcer  . CHOLECYSTECTOMY    . EYE SURGERY Bilateral    Cataract Extraction with IOL  . IRRIGATION AND DEBRIDEMENT FOOT Left 10/15/2016   Procedure: IRRIGATION AND DEBRIDEMENT FOOT-EXCISIONAL DEBRIDEMENT OF SKIN 3RD, 4TH AND 5TH TOES WITH APPLICATION OF APLIGRAFT;  Surgeon: Albertine Patricia, DPM;  Location: [facility] ORS;  Service: Podiatry;  Laterality: Left;  necrotic,gangrene  . IRRIGATION AND DEBRIDEMENT FOOT Left 12/09/2016   Procedure: IRRIGATION AND DEBRIDEMENT FOOT-MUSCLE/FASCIA, RESECTION OF FOURTH AND FIFTH METATARSAL NECROTIC BONE;  Surgeon: Albertine Patricia, DPM;  Location: [facility] ORS;  Service: Podiatry;  Laterality: Left;  . LOWER EXTREMITY ANGIOGRAPHY Left 08/16/2016   Procedure: Lower Extremity Angiography;  Surgeon: Algernon Huxley, MD;  Location: [place] [facility];  Service: Cardiovascular;  Laterality: Left;  . LOWER EXTREMITY ANGIOGRAPHY Left 09/01/2016   Procedure: Lower Extremity  Angiography;  Surgeon: Algernon Huxley, MD;  Location: [place] CV LAB;  Service: Cardiovascular;  Laterality: Left;  . LOWER EXTREMITY ANGIOGRAPHY Left 10/14/2016   Procedure: Lower Extremity Angiography;  Surgeon: Algernon Huxley, MD;  Location: [place] [facility];  Service: Cardiovascular;  Laterality: Left;  . LOWER EXTREMITY ANGIOGRAPHY Left 12/20/2016   Procedure: Lower Extremity Angiography;  Surgeon: Algernon Huxley, MD;  Location: [place] [facility];  Service: Cardiovascular;  Laterality: Left;  . LOWER EXTREMITY INTERVENTION  09/01/2016   Procedure: Lower Extremity Intervention;  Surgeon: Algernon Huxley, MD;  Location: [place] [facility] LAB;  Service: Cardiovascular;;  . PROSTATE SURGERY     removal  . TONSILLECTOMY     as a child    Prior to Admission medications   Medication Sig Start Date End Date Taking? Authorizing Provider  aspirin EC 81 MG tablet Take 81 mg by mouth daily.   Yes [provider]  diltiazem (CARDIZEM CD) 240 MG 24 hr capsule Take 300 mg by mouth daily.    Yes [provider]  docusate sodium (COLACE) 100 MG capsule Take 100 mg by mouth 2 (two) times daily.   Yes [provider]  famotidine (PEPCID) 20 MG tablet Take 20 mg by mouth 2 (two) times daily.   Yes [provider]  insulin aspart (NOVOLOG) 100 UNIT/ML injection Inject 0-5 Units into the skin 3 (three) times daily with meals.    Yes [provider]  insulin glargine (LANTUS) 100 UNIT/ML injection Inject 0.1 mLs (10 Units total) into the skin at bedtime. 01/19/17  Yes Sainani, Belia Heman, MD  JARDIANCE 10 MG TABS tablet Take 10 mg by mouth daily.  06/28/17  Yes [provider]  lamoTRIgine (LAMICTAL) 25 MG tablet Take 100 mg by mouth 2 (two) times daily.  04/20/17  Yes [provider]  meloxicam (MOBIC) 7.5 MG tablet Take 7.5 mg by mouth daily.   Yes [provider]  metFORMIN (GLUCOPHAGE) 1000 MG tablet TAKE 1 TABLET TWICE A DAY WITH MEALS 11/28/15   Yes [provider]  naproxen sodium (ALEVE) 220 MG tablet Take 220 mg by mouth 2 (two) times daily as needed.   Yes [provider]  oxyCODONE-acetaminophen (PERCOCET/ROXICET) 5-325 MG tablet Take 1 tablet by mouth every 6 (six) hours as needed for severe pain.   Yes [provider]  simethicone (MYLICON) 80 MG chewable tablet Chew 80 mg by mouth every 6 (six) hours as needed for flatulence.   Yes [provider]  atorvastatin (LIPITOR) 80 MG tablet Take 80 mg by mouth daily at 6 PM.     [provider]  digoxin (LANOXIN) 0.125 MG tablet Take 1 tablet (0.125 mg total) by mouth daily. Patient not taking: Reported on 07/22/2017 01/20/17   Henreitta Leber, MD  furosemide (LASIX) 20 MG tablet Take 1 tablet (20 mg total) by mouth daily. Patient not taking: Reported on 10/15/2017 02/13/17   Schnier, Dolores Lory, MD  lubiprostone (AMITIZA) 24 MCG capsule Take by mouth.    [provider]  mirabegron ER (MYRBETRIQ) 25 MG TB24 tablet Take 1 tablet (25 mg total) by mouth daily. Patient not taking: Reported on  10/15/2017 08/19/17   Zara Council A, PA-C  oxyCODONE (OXY IR/ROXICODONE) 5 MG immediate release tablet Take 1 tablet (5 mg total) by mouth every 6 (six) hours as needed for moderate pain. Patient not taking: Reported on 07/22/2017 01/19/17   Henreitta Leber, MD  oxyCODONE (OXYCONTIN) 10 mg 12 hr tablet Take 1 tablet (10 mg total) by mouth every 12 (twelve) hours. Patient not taking: Reported on 07/22/2017 01/19/17   Henreitta Leber, MD  potassium chloride SA (K-DUR,KLOR-CON) 20 MEQ tablet Take by mouth. 03/29/17   [provider]  Rivaroxaban (XARELTO) 15 MG TABS tablet Take by mouth.    [provider]  silver sulfADIAZINE (SILVADENE) 1 % cream Apply topically daily. Patient not taking: Reported on 10/15/2017 02/12/17   Schnier, Dolores Lory, MD  TOPROL XL 100 MG 24 hr tablet Take 100 mg by mouth daily. 12/25/16   [provider]    TRULICITY 1.02 VO/5.3GU SOPN 0.75 mg once a week. 11/14/16   [provider]    Allergies Contrast media [iodinated diagnostic agents]; Iohexol; Metrizamide; and Latex  Family History  Problem Relation Age of Onset  . Hypertension Mother   . Diabetes Mother   . Prostate cancer Neg Hx   . Bladder Cancer Neg Hx   . Kidney disease Neg Hx     Social History Social History   Tobacco Use  . Smoking status: Former Smoker    Packs/day: 0.25    Types: Cigarettes    Last attempt to quit: 10/14/1994    Years since quitting: 23.0  . Smokeless tobacco: Never Used  Substance Use Topics  . Alcohol use: No    Alcohol/week: 0.0 oz  . Drug use: No    Review of Systems Level 5 exemption history limited by the patient's clinical condition ____________________________________________   PHYSICAL EXAM:  VITAL SIGNS: ED Triage Vitals  Enc Vitals Group     BP      Pulse      Resp      Temp      Temp src      SpO2      Weight      Height      Head Circumference      Peak Flow      Pain Score      Pain Loc      Pain Edu?      Excl. in [place]?     Constitutional: The patient appears critically ill diaphoretic and short of breath Eyes: PERRL EOMI. Head: Atraumatic. Nose: No congestion/rhinnorhea. Mouth/Throat: No trismus Neck: No stridor.   Cardiovascular: Irregularly irregular and tachycardic Respiratory: Moderate respiratory distress with crackles at bilateral bases using accessory muscles Gastrointestinal: Soft nontender Musculoskeletal: Legs equal in size bilateral lower extremity edema Neurologic:  No gross focal neurologic deficits are appreciated. Skin: Diaphoretic \ psychiatric: Anxious appearing  ____________________________________________   DIFFERENTIAL includes but not limited to  DKA, HHS, A. fib RVR, dehydration, heart failure, pneumonia ____________________________________________   LABS (all labs ordered are listed, but only abnormal results are  displayed)  Labs Reviewed  BLOOD GAS, VENOUS - Abnormal; Notable for the following components:      Result Value   pCO2, Ven 37 (*)    Bicarbonate 18.2 (*)    Acid-base deficit 7.4 (*)    All other components within normal limits  COMPREHENSIVE METABOLIC PANEL - Abnormal; Notable for the following components:   Sodium 134 (*)    Chloride 99 (*)  CO2 19 (*)    Glucose, Bld 686 (*)    BUN 28 (*)    Creatinine, Ser 1.29 (*)    Total Protein 8.2 (*)    Alkaline Phosphatase 195 (*)    GFR calc non Af Amer 51 (*)    GFR calc Af Amer 59 (*)    Anion gap 16 (*)    All other components within normal limits  TROPONIN I - Abnormal; Notable for the following components:   Troponin I 0.11 (*)    All other components within normal limits  BRAIN NATRIURETIC PEPTIDE - Abnormal; Notable for the following components:   B Natriuretic Peptide 459.0 (*)    All other components within normal limits  CBC WITH DIFFERENTIAL/PLATELET - Abnormal; Notable for the following components:   WBC 12.9 (*)    RBC 6.24 (*)    HCT 53.0 (*)    RDW 18.2 (*)    Neutro Abs 10.9 (*)    All other components within normal limits  URINALYSIS, COMPLETE (UACMP) WITH MICROSCOPIC - Abnormal; Notable for the following components:   Color, Urine STRAW (*)    APPearance CLEAR (*)    Glucose, UA >=500 (*)    Hgb urine dipstick SMALL (*)    Ketones, ur 20 (*)    Protein, ur 30 (*)    All other components within normal limits  DIGOXIN LEVEL - Abnormal; Notable for the following components:   Digoxin Level <0.2 (*)    All other components within normal limits  BETA-HYDROXYBUTYRIC ACID - Abnormal; Notable for the following components:   Beta-Hydroxybutyric Acid 2.80 (*)    All other components within normal limits  GLUCOSE, CAPILLARY - Abnormal; Notable for the following components:   Glucose-Capillary >600 (*)    All other components within normal limits  GLUCOSE, CAPILLARY - Abnormal; Notable for the following  components:   Glucose-Capillary 578 (*)    All other components within normal limits  GLUCOSE, CAPILLARY - Abnormal; Notable for the following components:   Glucose-Capillary 512 (*)    All other components within normal limits  GLUCOSE, CAPILLARY - Abnormal; Notable for the following components:   Glucose-Capillary 456 (*)    All other components within normal limits  GLUCOSE, CAPILLARY - Abnormal; Notable for the following components:   Glucose-Capillary 312 (*)    All other components within normal limits  GLUCOSE, CAPILLARY - Abnormal; Notable for the following components:   Glucose-Capillary 291 (*)    All other components within normal limits  GLUCOSE, CAPILLARY - Abnormal; Notable for the following components:   Glucose-Capillary 233 (*)    All other components within normal limits  GLUCOSE, CAPILLARY - Abnormal; Notable for the following components:   Glucose-Capillary 234 (*)    All other components within normal limits  GLUCOSE, CAPILLARY - Abnormal; Notable for the following components:   Glucose-Capillary 151 (*)    All other components within normal limits  GLUCOSE, CAPILLARY - Abnormal; Notable for the following components:   Glucose-Capillary 158 (*)    All other components within normal limits  GLUCOSE, CAPILLARY - Abnormal; Notable for the following components:   Glucose-Capillary 224 (*)    All other components within normal limits  BASIC METABOLIC PANEL - Abnormal; Notable for the following components:   Sodium 134 (*)    Glucose, Bld 331 (*)    BUN 30 (*)    Calcium 8.4 (*)    All other components within normal limits  GLUCOSE, CAPILLARY -  Abnormal; Notable for the following components:   Glucose-Capillary 318 (*)    All other components within normal limits  TROPONIN I - Abnormal; Notable for the following components:   Troponin I 17.51 (*)    All other components within normal limits  APTT - Abnormal; Notable for the following components:   aPTT 48 (*)     All other components within normal limits  GLUCOSE, CAPILLARY - Abnormal; Notable for the following components:   Glucose-Capillary 304 (*)    All other components within normal limits  BASIC METABOLIC PANEL - Abnormal; Notable for the following components:   Glucose, Bld 206 (*)    BUN 34 (*)    Creatinine, Ser 1.37 (*)    Calcium 8.4 (*)    GFR calc non Af Amer 47 (*)    GFR calc Af Amer 55 (*)    All other components within normal limits  BRAIN NATRIURETIC PEPTIDE - Abnormal; Notable for the following components:   B Natriuretic Peptide 3,185.0 (*)    All other components within normal limits  CBC WITH DIFFERENTIAL/PLATELET - Abnormal; Notable for the following components:   WBC 13.5 (*)    RDW 18.2 (*)    Neutro Abs 11.5 (*)    All other components within normal limits  GLUCOSE, CAPILLARY - Abnormal; Notable for the following components:   Glucose-Capillary 300 (*)    All other components within normal limits  GLUCOSE, CAPILLARY - Abnormal; Notable for the following components:   Glucose-Capillary 213 (*)    All other components within normal limits  GLUCOSE, CAPILLARY - Abnormal; Notable for the following components:   Glucose-Capillary 193 (*)    All other components within normal limits  GLUCOSE, CAPILLARY - Abnormal; Notable for the following components:   Glucose-Capillary 160 (*)    All other components within normal limits  GLUCOSE, CAPILLARY - Abnormal; Notable for the following components:   Glucose-Capillary 153 (*)    All other components within normal limits  GLUCOSE, CAPILLARY - Abnormal; Notable for the following components:   Glucose-Capillary 137 (*)    All other components within normal limits  GLUCOSE, CAPILLARY - Abnormal; Notable for the following components:   Glucose-Capillary 127 (*)    All other components within normal limits  HEPARIN LEVEL (UNFRACTIONATED) - Abnormal; Notable for the following components:   Heparin Unfractionated 0.19 (*)      All other components within normal limits  GLUCOSE, CAPILLARY - Abnormal; Notable for the following components:   Glucose-Capillary 134 (*)    All other components within normal limits  GLUCOSE, CAPILLARY - Abnormal; Notable for the following components:   Glucose-Capillary 139 (*)    All other components within normal limits  GLUCOSE, CAPILLARY - Abnormal; Notable for the following components:   Glucose-Capillary 187 (*)    All other components within normal limits  GLUCOSE, CAPILLARY - Abnormal; Notable for the following components:   Glucose-Capillary 197 (*)    All other components within normal limits  GLUCOSE, CAPILLARY - Abnormal; Notable for the following components:   Glucose-Capillary 229 (*)    All other components within normal limits  GLUCOSE, CAPILLARY - Abnormal; Notable for the following components:   Glucose-Capillary 222 (*)    All other components within normal limits  GLUCOSE, CAPILLARY - Abnormal; Notable for the following components:   Glucose-Capillary 158 (*)    All other components within normal limits  TROPONIN I - Abnormal; Notable for the following components:   Troponin I  10.68 (*)    All other components within normal limits  TROPONIN I - Abnormal; Notable for the following components:   Troponin I 7.08 (*)    All other components within normal limits  TROPONIN I - Abnormal; Notable for the following components:   Troponin I 7.08 (*)    All other components within normal limits  LACTIC ACID, PLASMA - Abnormal; Notable for the following components:   Lactic Acid, Venous 2.7 (*)    All other components within normal limits  GLUCOSE, CAPILLARY - Abnormal; Notable for the following components:   Glucose-Capillary 127 (*)    All other components within normal limits  HEPARIN LEVEL (UNFRACTIONATED) - Abnormal; Notable for the following components:   Heparin Unfractionated 0.92 (*)    All other components within normal limits  GLUCOSE, CAPILLARY -  Abnormal; Notable for the following components:   Glucose-Capillary 103 (*)    All other components within normal limits  GLUCOSE, CAPILLARY - Abnormal; Notable for the following components:   Glucose-Capillary 113 (*)    All other components within normal limits  GLUCOSE, CAPILLARY - Abnormal; Notable for the following components:   Glucose-Capillary 135 (*)    All other components within normal limits  GLUCOSE, CAPILLARY - Abnormal; Notable for the following components:   Glucose-Capillary 165 (*)    All other components within normal limits  GLUCOSE, CAPILLARY - Abnormal; Notable for the following components:   Glucose-Capillary 151 (*)    All other components within normal limits  TROPONIN I - Abnormal; Notable for the following components:   Troponin I 5.41 (*)    All other components within normal limits  LIPID PANEL - Abnormal; Notable for the following components:   HDL 37 (*)    All other components within normal limits  GLUCOSE, CAPILLARY - Abnormal; Notable for the following components:   Glucose-Capillary 135 (*)    All other components within normal limits  LACTIC ACID, PLASMA - Abnormal; Notable for the following components:   Lactic Acid, Venous 2.4 (*)    All other components within normal limits  CBC WITH DIFFERENTIAL/PLATELET - Abnormal; Notable for the following components:   WBC 11.0 (*)    RDW 18.6 (*)    Neutro Abs 8.5 (*)    All other components within normal limits  RENAL FUNCTION PANEL - Abnormal; Notable for the following components:   CO2 21 (*)    Glucose, Bld 122 (*)    BUN 35 (*)    Calcium 8.4 (*)    Albumin 3.1 (*)    GFR calc non Af Amer 55 (*)    All other components within normal limits  GLUCOSE, CAPILLARY - Abnormal; Notable for the following components:   Glucose-Capillary 100 (*)    All other components within normal limits  GLUCOSE, CAPILLARY - Abnormal; Notable for the following components:   Glucose-Capillary 103 (*)    All other  components within normal limits  CBC - Abnormal; Notable for the following components:   RDW 18.3 (*)    All other components within normal limits  GLUCOSE, CAPILLARY - Abnormal; Notable for the following components:   Glucose-Capillary 130 (*)    All other components within normal limits  MRSA PCR SCREENING  ETHANOL  PROTIME-INR  PHOSPHORUS  MAGNESIUM  MAGNESIUM  PHOSPHORUS  APTT  PROTIME-INR  HEPARIN LEVEL (UNFRACTIONATED)  DIGOXIN LEVEL  GLUCOSE, CAPILLARY  LACTIC ACID, PLASMA  MAGNESIUM  HEPARIN LEVEL (UNFRACTIONATED)  GLUCOSE, CAPILLARY  LACTIC ACID, PLASMA  GLUCOSE, CAPILLARY  HEPARIN LEVEL (UNFRACTIONATED)    Lab work reviewed by me with a number of abnormalities most concerning elevated sugar with anion gap and low pH concerning for diabetic ketoacidosis __________________________________________  EKG  ED ECG REPORT I, Darel Hong, the attending physician, personally viewed and interpreted this ECG.  Date: 10/15/2017 EKG Time:  Rate: 173 Rhythm: Atrial fibrillation with rapid ventricular response QRS Axis: normal Intervals: normal ST/T Wave abnormalities: Rate related ST changes Narrative Interpretation: Concerning for rate related ischemia  ____________________________________________  RADIOLOGY  Chest x-ray reviewed by me consistent with pulmonary edema ____________________________________________   PROCEDURES  Procedure(s) performed: no  .Critical Care Performed by: Darel Hong, MD Authorized by: Darel Hong, MD   Critical care provider statement:    Critical care time (minutes):  30   Critical care time was exclusive of:  Separately billable procedures and treating other patients   Critical care was necessary to treat or prevent imminent or life-threatening deterioration of the following conditions:  Cardiac failure and respiratory failure   Critical care was time spent personally by me on the following activities:  Development  of treatment plan with patient or surrogate, discussions with consultants, evaluation of patient's response to treatment, examination of patient, obtaining history from patient or surrogate, ordering and performing treatments and interventions, ordering and review of laboratory studies, ordering and review of radiographic studies, pulse oximetry, re-evaluation of patient's condition and review of old charts    Critical Care performed: Yes  Observation: no ____________________________________________   INITIAL IMPRESSION / ASSESSMENT AND PLAN / ED COURSE  Pertinent labs & imaging results that were available during my care of the patient were reviewed by me and considered in my medical decision making (see chart for details).      ----------------------------------------- 12:27 AM on 10/15/2017 -----------------------------------------  The patient received 700 cc of normal saline in route and he is receiving another liter now.  He is at a total of about a liter and and his heart rate remains in the 170s.  While I do believe he will improve with fluids however this is unacceptably high so I will begin with 2.5 mg of IV metoprolol as he is diaphoretic uncomfortable appearing with rate related changes on his EKG.  ____________________________________________  ----------------------------------------- 12:43 AM on 10/15/2017 -----------------------------------------  The patient remains hypoxic on room air coming up to only about 92% on 4 L.  He has a known history of congestive heart failure so I will place him on BiPAP now assuming that his noncompliance is contributed to pulmonary edema.  After 2.5 mg of metoprolol his heart rate is now down to about 06/23/1936.    ----------------------------------------- 1:03 AM on 10/15/2017 -----------------------------------------  The patient just had 10 beats of nonsustained ventricular tachycardia.  He continues to require inpatient  admission for respiratory support, nodal blockade, fluid resuscitation, and insulin drip.  I discussed with the patient and family who verbalized understanding agreement the plan.  I have also discussed with the hospitalist who has graciously agreed to admit the patient to his service.  FINAL CLINICAL IMPRESSION(S) / ED DIAGNOSES  Final diagnoses:  Ventricular tachycardia ([place])  Diabetic ketoacidosis without coma associated with type 1 diabetes mellitus ([place])  Atrial fibrillation with RVR ([place])      NEW MEDICATIONS STARTED DURING THIS VISIT:  Current Discharge Medication List       Note:  This document was prepared using Dragon voice recognition software and may include unintentional dictation errors.     Maddyson Keil,  Milta Deiters, MD 10/17/17 [id number]

## 2017-10-15 NOTE — Progress Notes (Signed)
Patients wife to nurses station verbalizing that patients says he is having trouble breathing.  Nurse aid reported that she just finished assisting patient with urinal and partial linen change.  Vital signs unchanged.  Patient reports feeling anxious.  Lungs with bilateral rhonchi.  Dr. Celesta Aver updated and orders placed.

## 2017-10-15 NOTE — ED Notes (Signed)
Called pharmacy to request medication 

## 2017-10-15 NOTE — Progress Notes (Signed)
Dr. Celesta Aver notified of elevated troponin.  Orders to be placed.

## 2017-10-15 NOTE — ED Notes (Signed)
Patient's oxygen 88% on RA. Patient placed on 4L [place]. Patient's oxygen saturation currently 92%. RN will continue to monitor.

## 2017-10-15 NOTE — Progress Notes (Signed)
[name] at [place] NAME: [name]    MR#:  [id number]  DATE OF BIRTH:  November 06, 1934  SUBJECTIVE:  CHIEF COMPLAINT:   Chief Complaint  Patient presents with  . Tachycardia  . Hyperglycemia    Came with hypoglycemia, atrial fibrillation and shortness of breath   Requiring BiPAP initially, improved.   When I saw, patient was again getting more short of breath with hypoxic and appeared in respiratory distress.  REVIEW OF SYSTEMS:  CONSTITUTIONAL: No fever, fatigue or weakness.  EYES: No blurred or double vision.  EARS, NOSE, AND THROAT: No tinnitus or ear pain.  RESPIRATORY: No cough,have shortness of breath,no wheezing or hemoptysis.  CARDIOVASCULAR: No chest pain, orthopnea,have edema.  GASTROINTESTINAL: No nausea, vomiting, diarrhea or abdominal pain.  GENITOURINARY: No dysuria, hematuria.  ENDOCRINE: No polyuria, nocturia,  HEMATOLOGY: No anemia, easy bruising or bleeding SKIN: No rash or lesion. MUSCULOSKELETAL: No joint pain or arthritis.   NEUROLOGIC: No tingling, numbness, weakness.  PSYCHIATRY: No anxiety or depression.   ROS  DRUG ALLERGIES:   Allergies  Allergen Reactions  . Contrast Media [Iodinated Diagnostic Agents] Shortness Of Breath    SOB  . Iohexol Shortness Of Breath     Desc: Respiratory Distress, Laryngedema, diaphoresis, Onset Date: [id number]   . Metrizamide Shortness Of Breath    SOB SOB SOB   . Latex Rash    VITALS:  Blood pressure 119/85, pulse 98, temperature 97.6 F (36.4 C), temperature source Oral, resp. rate (!) 31, height 5\' 7"  (1.702 m), weight 80.2 kg (176 lb 12.9 oz), SpO2 97 %.  PHYSICAL EXAMINATION:  GENERAL:  83 y.o.-year-old patient lying in the bed with  acute respiratory distress.  EYES: Pupils equal, round, reactive to light and accommodation. No scleral icterus. Extraocular muscles intact.  HEENT: Head atraumatic, normocephalic. Oropharynx and nasopharynx clear.  NECK:   Supple, no jugular venous distention. No thyroid enlargement, no tenderness.  LUNGS: Normal breath sounds bilaterally, no wheezing,bilateral crepitation. positive use of accessory muscles of respiration.  CARDIOVASCULAR: S1, S2 normal. No murmurs, rubs, or gallops.  ABDOMEN: Soft, nontender, nondistended. Bowel sounds present. No organomegaly or mass.  EXTREMITIES: No pedal edema, cyanosis, or clubbing. Left-sided below knee amputation.   Right side at the shin of TBI and there is an injury to the skin with surrounding redness. NEUROLOGIC: Cranial nerves II through XII are intact. Muscle strength 4/5 in all extremities. Sensation intact. Gait not checked.  PSYCHIATRIC: The patient is alert and oriented x 3.  SKIN: No obvious rash, lesion, or ulcer.   Physical Exam LABORATORY PANEL:   CBC Recent Labs  Lab 10/15/17 0008  WBC 12.9*  HGB 17.5  HCT 53.0*  PLT 226   ------------------------------------------------------------------------------------------------------------------  Chemistries  Recent Labs  Lab 10/15/17 0008 10/15/17 1309  NA 134* 134*  K 4.7 4.2  CL 99* 103  CO2 19* 22  GLUCOSE 686* 331*  BUN 28* 30*  CREATININE 1.29* 1.11  CALCIUM 8.9 8.4*  MG 1.9 1.8  AST 33  --   ALT 26  --   ALKPHOS 195*  --   BILITOT 1.1  --    ------------------------------------------------------------------------------------------------------------------  Cardiac Enzymes Recent Labs  Lab 10/15/17 0008 10/15/17 1303  TROPONINI 0.11* 17.51*   ------------------------------------------------------------------------------------------------------------------  RADIOLOGY:  Dg Chest Port 1 View  Result Date: 10/15/2017 CLINICAL DATA:  Respiratory failure EXAM: PORTABLE CHEST 1 VIEW COMPARISON:  10/15/2017 FINDINGS: The heart is mildly enlarged. Vascular congestion. Kerley B-lines towards  the lung bases are noted bilaterally. Interstitial opacities are present throughout both central  and basilar lung zones. No pneumothorax. IMPRESSION: CHF with interstitial edema Electronically Signed   By: Marybelle Killings M.D.   On: 10/15/2017 14:13   Dg Chest Port 1 View  Result Date: 10/15/2017 CLINICAL DATA:  Wound to the right lower leg, AFib EXAM: PORTABLE CHEST 1 VIEW COMPARISON:  01/12/2017, 06/03/2016 FINDINGS: Mild cardiomegaly. Diffuse increased interstitial opacity, suspicious for acute interstitial inflammation or edema. No large effusion. Aortic atherosclerosis. No pneumothorax. IMPRESSION: 1. Cardiomegaly with vascular congestion and mild to moderate diffuse increased interstitial opacity, favored to represent interstitial edema. 2. No significant effusion or focal airspace disease. Electronically Signed   By: Donavan Foil M.D.   On: 10/15/2017 01:00    ASSESSMENT AND PLAN:   Active Problems:   Atrial fibrillation with rapid ventricular response (HCC)  * hyperglycemia- nonketotic   Responded to IV insulin drip, switch to long-acting insulin with sliding scale control.  * atrial fibrillation   Likely secondary to stress of hyperglycemia and dehydration.   Currently on digoxin and Cardizem.   Heparin IV drip.  * non-ST elevation MI   Significant rise in his troponin, currently heparin IV drip and further management per cardiology.  * acute diastolic congestive heart failure   This is secondary to atrial fibrillation.   IV Lasix and BiPAP as needed.   Get echocardiogram.   All the records are reviewed and case discussed with Care Management/Social Workerr. Management plans discussed with the patient, family and they are in agreement.  CODE STATUS: Full.  TOTAL TIME TAKING CARE OF THIS PATIENT: 35 minutes.     POSSIBLE D/C IN 1-2 DAYS, DEPENDING ON CLINICAL CONDITION.   Vaughan Basta M.D on 10/15/2017   Between 7am to 6pm - Pager - [phone]  After 6pm go to [email or web address] - password [email or web address] [facility]  Office   [phone]  CC: Primary care physician; Alanson Aly, FNP  Note: This dictation was prepared with Dragon dictation along with smaller phrase technology. Any transcriptional errors that result from this process are unintentional.

## 2017-10-15 NOTE — Consult Note (Signed)
[facility] Cardiology  CARDIOLOGY CONSULT NOTE  Patient ID: BRANCH PACITTI MRN: [id number] DOB/AGE: [birth year] 83 y.o.  Admit date: 10/14/2017 Referring Physician [place] Primary Physician [facility] Primary Cardiologist Nehemiah Massed Reason for Consultation non-ST elevation myocardial infarction  HPI: 83 year old gentleman referred for evaluation of non-ST elevation myocardial infarction.  She was admitted 10/15/2017, with chief complaint of not feeling well, shortness of breath, diaphoresis and tachycardia.  Patient was noted to be in atrial fibrillation with a rapid ventricular rate.  Chest x-ray revealed mild pulmonary congestion.  Admission labs were notable for markedly elevated glucose of 686 mg/dL.  According to the wife, the patient has been noncompliant with his diabetic medications.  The patient does have a known history of atrial fibrillation, on Xarelto for stroke prevention.  The patient had chest pain.  Initial troponin was 0.11.  Patient was admitted to the ICU where he was treated with intravenous insulin, received gentle IV fluid hydration, diltiazem drip for rate control.  Has continued to require supplemental O2, today requiring BiPAP.  Repeat troponin was performed today, is elevated to 17.51.  Repeat ECG reveals atrial fibrillation with controlled ventricular rate, without acute ischemic ST-T wave changes.  The patient continues to deny chest pain.  Review of systems complete and found to be negative unless listed above     Past Medical History:  Diagnosis Date  . Arthritis   . Atrial fibrillation ([place])   . Carcinoma of prostate ([place])   . CHF (congestive heart failure) ([place])   . Chronic kidney disease   . Diabetes mellitus without complication ([place])   . ED (erectile dysfunction)   . Frequent urination   . Hyperlipidemia   . Hypertension   . Moderate mitral insufficiency   . Peripheral vascular disease ([place])   . Pneumonia 04/2016  . Prostate cancer ([place])   . Sleep apnea    OSA--USE C-PAP  . Ulcer of left foot due to type 2 diabetes mellitus ([place])   . Urinary stress incontinence, male     Past Surgical History:  Procedure Laterality Date  . AMPUTATION Left 01/12/2017   Procedure: AMPUTATION BELOW KNEE;  Surgeon: Algernon Huxley, MD;  Location: [facility] ORS;  Service: General;  Laterality: Left;  . APLIGRAFT PLACEMENT Left 10/15/2016   Procedure: APLIGRAFT PLACEMENT;  Surgeon: Albertine Patricia, DPM;  Location: [facility] ORS;  Service: Podiatry;  Laterality: Left;  necrotic ulcer  . CHOLECYSTECTOMY    . EYE SURGERY Bilateral    Cataract Extraction with IOL  . IRRIGATION AND DEBRIDEMENT FOOT Left 10/15/2016   Procedure: IRRIGATION AND DEBRIDEMENT FOOT-EXCISIONAL DEBRIDEMENT OF SKIN 3RD, 4TH AND 5TH TOES WITH APPLICATION OF APLIGRAFT;  Surgeon: Albertine Patricia, DPM;  Location: [facility] ORS;  Service: Podiatry;  Laterality: Left;  necrotic,gangrene  . IRRIGATION AND DEBRIDEMENT FOOT Left 12/09/2016   Procedure: IRRIGATION AND DEBRIDEMENT FOOT-MUSCLE/FASCIA, RESECTION OF FOURTH AND FIFTH METATARSAL NECROTIC BONE;  Surgeon: Albertine Patricia, DPM;  Location: [facility] ORS;  Service: Podiatry;  Laterality: Left;  . LOWER EXTREMITY ANGIOGRAPHY Left 08/16/2016   Procedure: Lower Extremity Angiography;  Surgeon: Algernon Huxley, MD;  Location: [place] [facility];  Service: Cardiovascular;  Laterality: Left;  . LOWER EXTREMITY ANGIOGRAPHY Left 09/01/2016   Procedure: Lower Extremity Angiography;  Surgeon: Algernon Huxley, MD;  Location: [place] CV LAB;  Service: Cardiovascular;  Laterality: Left;  . LOWER EXTREMITY ANGIOGRAPHY Left 10/14/2016   Procedure: Lower Extremity Angiography;  Surgeon: Algernon Huxley, MD;  Location: [place] CV LAB;  Service: Cardiovascular;  Laterality: Left;  . LOWER EXTREMITY ANGIOGRAPHY Left 12/20/2016   Procedure: Lower Extremity Angiography;  Surgeon: Algernon Huxley, MD;  Location: [place] [facility] LAB;  Service: Cardiovascular;  Laterality: Left;  . LOWER EXTREMITY  INTERVENTION  09/01/2016   Procedure: Lower Extremity Intervention;  Surgeon: Algernon Huxley, MD;  Location: [place] [facility] LAB;  Service: Cardiovascular;;  . PROSTATE SURGERY     removal  . TONSILLECTOMY     as a child    Facility-Administered Medications Prior to Admission  Medication Dose Route Frequency Provider Last Rate Last Dose  . docusate sodium (COLACE) capsule 100 mg  100 mg Oral BID Stegmayer, Kimberly A, PA-C      . heparin injection 5,000 Units  5,000 Units Subcutaneous Q8H Stegmayer, Kimberly A, PA-C      . piperacillin-tazobactam (ZOSYN) 3.375 g in dextrose 5 % 50 mL IVPB  3.375 g Intravenous Q6H Stegmayer, Kimberly A, PA-C      . senna (SENOKOT) tablet 8.6 mg  1 tablet Oral Daily PRN Stegmayer, Kimberly A, PA-C       Medications Prior to Admission  Medication Sig Dispense Refill Last Dose  . aspirin EC 81 MG tablet Take 81 mg by mouth daily.   Unknown at Unknown  . diltiazem (CARDIZEM CD) 240 MG 24 hr capsule Take 300 mg by mouth daily.    Unknown at Unknown  . docusate sodium (COLACE) 100 MG capsule Take 100 mg by mouth 2 (two) times daily.   Unknown at Unknown  . famotidine (PEPCID) 20 MG tablet Take 20 mg by mouth 2 (two) times daily.   Unknown at Unknown  . insulin aspart (NOVOLOG) 100 UNIT/ML injection Inject 0-5 Units into the skin 3 (three) times daily with meals.    Unknown at Unknown  . insulin glargine (LANTUS) 100 UNIT/ML injection Inject 0.1 mLs (10 Units total) into the skin at bedtime. 10 mL 11 Unknown at Unknown  . JARDIANCE 10 MG TABS tablet Take 10 mg by mouth daily.    Unknown at Unknown  . lamoTRIgine (LAMICTAL) 25 MG tablet Take 100 mg by mouth 2 (two) times daily.    Unknown at Unknown  . meloxicam (MOBIC) 7.5 MG tablet Take 7.5 mg by mouth daily.   Unknown at Unknown  . metFORMIN (GLUCOPHAGE) 1000 MG tablet TAKE 1 TABLET TWICE A DAY WITH MEALS   Unknown at Unknown  . naproxen sodium (ALEVE) 220 MG tablet Take 220 mg by mouth 2 (two) times daily as  needed.   prn at prn  . oxyCODONE-acetaminophen (PERCOCET/ROXICET) 5-325 MG tablet Take 1 tablet by mouth every 6 (six) hours as needed for severe pain.   prn at prn  . simethicone (MYLICON) 80 MG chewable tablet Chew 80 mg by mouth every 6 (six) hours as needed for flatulence.   prn at prn  . atorvastatin (LIPITOR) 80 MG tablet Take 80 mg by mouth daily at 6 PM.    Not Taking at Unknown time  . digoxin (LANOXIN) 0.125 MG tablet Take 1 tablet (0.125 mg total) by mouth daily. (Patient not taking: Reported on 07/22/2017)   Not Taking  . furosemide (LASIX) 20 MG tablet Take 1 tablet (20 mg total) by mouth daily. (Patient not taking: Reported on 10/15/2017) 30 tablet 0 Not Taking  . lubiprostone (AMITIZA) 24 MCG capsule Take by mouth.   Not Taking at Unknown time  . mirabegron ER (MYRBETRIQ) 25 MG TB24 tablet Take 1 tablet (25 mg total) by mouth  daily. (Patient not taking: Reported on 10/15/2017) 30 tablet 0 Not Taking  . oxyCODONE (OXY IR/ROXICODONE) 5 MG immediate release tablet Take 1 tablet (5 mg total) by mouth every 6 (six) hours as needed for moderate pain. (Patient not taking: Reported on 07/22/2017) 30 tablet 0 Not Taking  . oxyCODONE (OXYCONTIN) 10 mg 12 hr tablet Take 1 tablet (10 mg total) by mouth every 12 (twelve) hours. (Patient not taking: Reported on 07/22/2017) 14 tablet 0 Not Taking  . potassium chloride SA (K-DUR,KLOR-CON) 20 MEQ tablet Take by mouth.   Not Taking at Unknown time  . Rivaroxaban (XARELTO) 15 MG TABS tablet Take by mouth.   Not Taking at Unknown time  . silver sulfADIAZINE (SILVADENE) 1 % cream Apply topically daily. (Patient not taking: Reported on 10/15/2017) 50 g 0 Not Taking at Unknown time  . TOPROL XL 100 MG 24 hr tablet Take 100 mg by mouth daily.   Not Taking at Unknown time  . TRULICITY 1.47 WG/9.5AO SOPN 0.75 mg once a week.   Not Taking at Unknown time   Social History   Socioeconomic History  . Marital status: Married    Spouse name: Not on file  . Number of  children: Not on file  . Years of education: Not on file  . Highest education level: Not on file  Occupational History  . Occupation: retired  Scientific laboratory technician  . Financial resource strain: Not on file  . Food insecurity:    Worry: Not on file    Inability: Not on file  . Transportation needs:    Medical: Not on file    Non-medical: Not on file  Tobacco Use  . Smoking status: Former Smoker    Packs/day: 0.25    Types: Cigarettes    Last attempt to quit: 10/14/1994    Years since quitting: 23.0  . Smokeless tobacco: Never Used  Substance and Sexual Activity  . Alcohol use: No    Alcohol/week: 0.0 oz  . Drug use: No  . Sexual activity: Not on file  Lifestyle  . Physical activity:    Days per week: Not on file    Minutes per session: Not on file  . Stress: Not on file  Relationships  . Social connections:    Talks on phone: Not on file    Gets together: Not on file    Attends religious service: Not on file    Active member of club or organization: Not on file    Attends meetings of clubs or organizations: Not on file    Relationship status: Not on file  . Intimate partner violence:    Fear of current or ex partner: Not on file    Emotionally abused: Not on file    Physically abused: Not on file    Forced sexual activity: Not on file  Other Topics Concern  . Not on file  Social History Narrative  . Not on file    Family History  Problem Relation Age of Onset  . Hypertension Mother   . Diabetes Mother   . Prostate cancer Neg Hx   . Bladder Cancer Neg Hx   . Kidney disease Neg Hx       Review of systems complete and found to be negative unless listed above      PHYSICAL EXAM  General: Well developed, well nourished, in no acute distress HEENT:  Normocephalic and atramatic Neck:  No JVD.  Lungs: Clear bilaterally to auscultation and percussion. Heart:  HRRR . Normal S1 and S2 without gallops or murmurs.  Abdomen: Bowel sounds are positive, abdomen soft and  non-tender  Msk:  Back normal, normal gait. Normal strength and tone for age. Extremities: No clubbing, cyanosis or edema.   Neuro: Alert and oriented X 3. Psych:  Good affect, responds appropriately  Labs:   Lab Results  Component Value Date   WBC 12.9 (H) 10/15/2017   HGB 17.5 10/15/2017   HCT 53.0 (H) 10/15/2017   MCV 84.9 10/15/2017   PLT 226 10/15/2017    Recent Labs  Lab 10/15/17 0008 10/15/17 1309  NA 134* 134*  K 4.7 4.2  CL 99* 103  CO2 19* 22  BUN 28* 30*  CREATININE 1.29* 1.11  CALCIUM 8.9 8.4*  PROT 8.2*  --   BILITOT 1.1  --   ALKPHOS 195*  --   ALT 26  --   AST 33  --   GLUCOSE 686* 331*   Lab Results  Component Value Date   CKTOTAL 131 10/22/2016   CKMB 1.2 01/19/2013   TROPONINI 17.51 (HH) 10/15/2017    Lab Results  Component Value Date   CHOL 100 10/22/2016   Lab Results  Component Value Date   HDL 28 (L) 10/22/2016   Lab Results  Component Value Date   LDLCALC 56 10/22/2016   Lab Results  Component Value Date   TRIG 78 10/22/2016   Lab Results  Component Value Date   CHOLHDL 3.6 10/22/2016   No results found for: LDLDIRECT    Radiology: Dg Chest Port 1 View  Result Date: 10/15/2017 CLINICAL DATA:  Respiratory failure EXAM: PORTABLE CHEST 1 VIEW COMPARISON:  10/15/2017 FINDINGS: The heart is mildly enlarged. Vascular congestion. Kerley B-lines towards the lung bases are noted bilaterally. Interstitial opacities are present throughout both central and basilar lung zones. No pneumothorax. IMPRESSION: CHF with interstitial edema Electronically Signed   By: Marybelle Killings M.D.   On: 10/15/2017 14:13   Dg Chest Port 1 View  Result Date: 10/15/2017 CLINICAL DATA:  Wound to the right lower leg, AFib EXAM: PORTABLE CHEST 1 VIEW COMPARISON:  01/12/2017, 06/03/2016 FINDINGS: Mild cardiomegaly. Diffuse increased interstitial opacity, suspicious for acute interstitial inflammation or edema. No large effusion. Aortic atherosclerosis. No  pneumothorax. IMPRESSION: 1. Cardiomegaly with vascular congestion and mild to moderate diffuse increased interstitial opacity, favored to represent interstitial edema. 2. No significant effusion or focal airspace disease. Electronically Signed   By: Donavan Foil M.D.   On: 10/15/2017 01:00    EKG: Atrial fibrillation, with nonspecific ST-T wave abnormalities  ASSESSMENT AND PLAN:   1.  Non-ST elevation myocardial infarction, in the absence of chest pain or diagnostic ECG changes, setting of respiratory failure, and marked hyperglycemia 2.  Marked hyperglycemia, secondary to medical noncompliance 3.  Atrial fibrillation with rapid ventricular rate, much improved, rate controlled on Cardizem CD, digoxin and metoprolol, on Xarelto for stroke prevention which currently is being held 4.  Marked hyperglycemia, improved after IV insulin  Recommendations  1.  Agree with current therapy 2.  Agree with heparin drip 3.  No indication at this time for emergent cardiac catheterization, in the absence of chest pain or ECG changes 4.  Review 2D echocardiogram 5.  Further recommendations, including cardiac catheterization, pending patient's initial clinical course  Signed: Isaias Cowman MD,PhD, [facility] 10/15/2017, 4:30 PM

## 2017-10-15 NOTE — Progress Notes (Signed)
*  PRELIMINARY RESULTS* Echocardiogram 2D Echocardiogram has been performed.  [name] Preslie Depasquale 10/15/2017, 4:24 PM

## 2017-10-15 NOTE — H&P (Addendum)
[facility] [place] [facility] NAME: [name]    MR#:  [id number]  DATE OF BIRTH:  Feb 09, 1935  DATE OF ADMISSION:  10/14/2017  PRIMARY CARE PHYSICIAN: Alanson Aly, FNP   REQUESTING/REFERRING PHYSICIAN: Darel Hong, MD  CHIEF COMPLAINT:   Chief Complaint  Patient presents with  . Tachycardia  . Hyperglycemia    HISTORY OF PRESENT ILLNESS:  [name]  is a 83 y.o. male with a known history of AF (Xarelto), DM, PVD p/w 1d Hx unctrl T2IDDM/HHS, AF + RVR in setting of noncompliance. Pt w/ L BKA, RLE wound. EMS called 2/2 tachycardia, SOB. HR 150-160. CXR w/ vascular congestion, (-) effusion. Glucose 686. Started on insulin gtt, BiPAP in ED.  PAST MEDICAL HISTORY:   Past Medical History:  Diagnosis Date  . Arthritis   . Atrial fibrillation ([place])   . Carcinoma of prostate ([place])   . CHF (congestive heart failure) ([place])   . Chronic kidney disease   . Diabetes mellitus without complication ([place])   . ED (erectile dysfunction)   . Frequent urination   . Hyperlipidemia   . Hypertension   . Moderate mitral insufficiency   . Peripheral vascular disease ([place])   . Pneumonia 04/2016  . Prostate cancer ([place])   . Sleep apnea    OSA--USE C-PAP  . Ulcer of left foot due to type 2 diabetes mellitus ([place])   . Urinary stress incontinence, male     PAST SURGICAL HISTORY:   Past Surgical History:  Procedure Laterality Date  . AMPUTATION Left 01/12/2017   Procedure: AMPUTATION BELOW KNEE;  Surgeon: Algernon Huxley, MD;  Location: [facility];  Service: General;  Laterality: Left;  . APLIGRAFT PLACEMENT Left 10/15/2016   Procedure: APLIGRAFT PLACEMENT;  Surgeon: Albertine Patricia, DPM;  Location: [facility];  Service: Podiatry;  Laterality: Left;  necrotic ulcer  . CHOLECYSTECTOMY    . EYE SURGERY Bilateral    Cataract Extraction with IOL  . IRRIGATION AND DEBRIDEMENT FOOT Left 10/15/2016   Procedure: IRRIGATION AND DEBRIDEMENT FOOT-EXCISIONAL  DEBRIDEMENT OF SKIN 3RD, 4TH AND 5TH TOES WITH APPLICATION OF APLIGRAFT;  Surgeon: Albertine Patricia, DPM;  Location: [facility];  Service: Podiatry;  Laterality: Left;  necrotic,gangrene  . IRRIGATION AND DEBRIDEMENT FOOT Left 12/09/2016   Procedure: IRRIGATION AND DEBRIDEMENT FOOT-MUSCLE/FASCIA, RESECTION OF FOURTH AND FIFTH METATARSAL NECROTIC BONE;  Surgeon: Albertine Patricia, DPM;  Location: [facility];  Service: Podiatry;  Laterality: Left;  . LOWER EXTREMITY ANGIOGRAPHY Left 08/16/2016   Procedure: Lower Extremity Angiography;  Surgeon: Algernon Huxley, MD;  Location: [facility];  Service: Cardiovascular;  Laterality: Left;  . LOWER EXTREMITY ANGIOGRAPHY Left 09/01/2016   Procedure: Lower Extremity Angiography;  Surgeon: Algernon Huxley, MD;  Location: [facility];  Service: Cardiovascular;  Laterality: Left;  . LOWER EXTREMITY ANGIOGRAPHY Left 10/14/2016   Procedure: Lower Extremity Angiography;  Surgeon: Algernon Huxley, MD;  Location: [facility];  Service: Cardiovascular;  Laterality: Left;  . LOWER EXTREMITY ANGIOGRAPHY Left 12/20/2016   Procedure: Lower Extremity Angiography;  Surgeon: Algernon Huxley, MD;  Location: [facility];  Service: Cardiovascular;  Laterality: Left;  . LOWER EXTREMITY INTERVENTION  09/01/2016   Procedure: Lower Extremity Intervention;  Surgeon: Algernon Huxley, MD;  Location: [facility];  Service: Cardiovascular;;  . PROSTATE SURGERY     removal  . TONSILLECTOMY     as a child    SOCIAL HISTORY:  Social History   Tobacco Use  . Smoking status: Former Smoker    Packs/day: 0.25    Types: Cigarettes    Last attempt to quit: 10/14/1994    Years since quitting: 23.0  . Smokeless tobacco: Never Used  Substance Use Topics  . Alcohol use: No    Alcohol/week: 0.0 oz    FAMILY HISTORY:   Family History  Problem Relation Age of Onset  . Hypertension Mother   . Diabetes Mother   . Prostate cancer Neg Hx   . Bladder Cancer Neg Hx     . Kidney disease Neg Hx     DRUG ALLERGIES:   Allergies  Allergen Reactions  . Contrast Media [Iodinated Diagnostic Agents] Shortness Of Breath    SOB  . Iohexol Shortness Of Breath     Desc: Respiratory Distress, Laryngedema, diaphoresis, Onset Date: [id number]   . Metrizamide Shortness Of Breath    SOB SOB SOB   . Latex Rash    REVIEW OF SYSTEMS:   Review of Systems  Constitutional: Negative for chills, diaphoresis, fever, malaise/fatigue and weight loss.  HENT: Negative for congestion, ear pain, hearing loss, nosebleeds, sinus pain, sore throat and tinnitus.   Eyes: Negative for blurred vision, double vision and photophobia.  Respiratory: Positive for shortness of breath. Negative for cough, hemoptysis, sputum production and wheezing.   Cardiovascular: Positive for palpitations. Negative for chest pain, orthopnea, claudication, leg swelling and PND.  Gastrointestinal: Negative for abdominal pain, blood in stool, constipation, diarrhea, heartburn, melena, nausea and vomiting.  Genitourinary: Negative for dysuria, frequency, hematuria and urgency.  Musculoskeletal: Negative for back pain, joint pain, myalgias and neck pain.  Skin: Negative for itching and rash.  Neurological: Negative for dizziness, tingling, tremors, sensory change, speech change, focal weakness, seizures, loss of consciousness, weakness and headaches.  Psychiatric/Behavioral: Negative for memory loss. The patient does not have insomnia.     MEDICATIONS AT HOME:   Prior to Admission medications   Medication Sig Start Date End Date Taking? Authorizing Provider  aspirin EC 81 MG tablet Take 81 mg by mouth daily.   Yes [provider]  diltiazem (CARDIZEM CD) 240 MG 24 hr capsule Take 300 mg by mouth daily.    Yes [provider]  docusate sodium (COLACE) 100 MG capsule Take 100 mg by mouth 2 (two) times daily.   Yes [provider]  famotidine (PEPCID) 20 MG tablet Take 20 mg by  mouth 2 (two) times daily.   Yes [provider]  insulin aspart (NOVOLOG) 100 UNIT/ML injection Inject 0-5 Units into the skin 3 (three) times daily with meals.    Yes [provider]  insulin glargine (LANTUS) 100 UNIT/ML injection Inject 0.1 mLs (10 Units total) into the skin at bedtime. 01/19/17  Yes Sainani, Belia Heman, MD  JARDIANCE 10 MG TABS tablet Take 10 mg by mouth daily.  06/28/17  Yes [provider]  lamoTRIgine (LAMICTAL) 25 MG tablet Take 100 mg by mouth 2 (two) times daily.  04/20/17  Yes [provider]  meloxicam (MOBIC) 7.5 MG tablet Take 7.5 mg by mouth daily.   Yes [provider]  metFORMIN (GLUCOPHAGE) 1000 MG tablet TAKE 1 TABLET TWICE A DAY WITH MEALS 11/28/15  Yes [provider]  naproxen sodium (ALEVE) 220 MG tablet Take 220 mg by mouth 2 (two) times daily as needed.   Yes [provider]  oxyCODONE-acetaminophen (PERCOCET/ROXICET) 5-325 MG tablet Take 1 tablet by mouth every 6 (six) hours  as needed for severe pain.   Yes [provider]  simethicone (MYLICON) 80 MG chewable tablet Chew 80 mg by mouth every 6 (six) hours as needed for flatulence.   Yes [provider]  atorvastatin (LIPITOR) 80 MG tablet Take 80 mg by mouth daily at 6 PM.     [provider]  digoxin (LANOXIN) 0.125 MG tablet Take 1 tablet (0.125 mg total) by mouth daily. Patient not taking: Reported on 07/22/2017 01/20/17   Henreitta Leber, MD  furosemide (LASIX) 20 MG tablet Take 1 tablet (20 mg total) by mouth daily. Patient not taking: Reported on 10/15/2017 02/13/17   Schnier, Dolores Lory, MD  lubiprostone (AMITIZA) 24 MCG capsule Take by mouth.    [provider]  mirabegron ER (MYRBETRIQ) 25 MG TB24 tablet Take 1 tablet (25 mg total) by mouth daily. Patient not taking: Reported on 10/15/2017 08/19/17   Zara Council A, PA-C  oxyCODONE (OXY IR/ROXICODONE) 5 MG immediate release tablet Take 1 tablet (5 mg total)  by mouth every 6 (six) hours as needed for moderate pain. Patient not taking: Reported on 07/22/2017 01/19/17   Henreitta Leber, MD  oxyCODONE (OXYCONTIN) 10 mg 12 hr tablet Take 1 tablet (10 mg total) by mouth every 12 (twelve) hours. Patient not taking: Reported on 07/22/2017 01/19/17   Henreitta Leber, MD  potassium chloride SA (K-DUR,KLOR-CON) 20 MEQ tablet Take by mouth. 03/29/17   [provider]  Rivaroxaban (XARELTO) 15 MG TABS tablet Take by mouth.    [provider]  silver sulfADIAZINE (SILVADENE) 1 % cream Apply topically daily. Patient not taking: Reported on 10/15/2017 02/12/17   Schnier, Dolores Lory, MD  TOPROL XL 100 MG 24 hr tablet Take 100 mg by mouth daily. 12/25/16   [provider]  TRULICITY 6.78 LF/8.1OF SOPN 0.75 mg once a week. 11/14/16   [provider]      VITAL SIGNS:  Blood pressure (!) 148/108, pulse (!) 156, temperature 97.7 F (36.5 C), temperature source Oral, resp. rate (!) 24, weight 81.6 kg (180 lb), SpO2 92 %.  PHYSICAL EXAMINATION:  Physical Exam  Constitutional: He appears well-developed and well-nourished. He is active and cooperative.  Non-toxic appearance. He does not have a sickly appearance. He does not appear ill. He appears distressed. He is not intubated. Face mask in place.  HENT:  Head: Normocephalic and atraumatic.  Mouth/Throat: Oropharynx is clear and moist. No oropharyngeal exudate.  Eyes: Conjunctivae, EOM and lids are normal. No scleral icterus.  Neck: Neck supple. No JVD present. No thyromegaly present.  Cardiovascular: S1 normal and S2 normal. An irregular rhythm present.  No extrasystoles are present. Tachycardia present. Exam reveals no gallop, no S3, no S4, no distant heart sounds and no friction rub.  No murmur heard. Pulmonary/Chest: Breath sounds normal. No accessory muscle usage or stridor. Tachypnea noted. No apnea and no bradypnea. He is not intubated. He is in respiratory distress. He has no  decreased breath sounds. He has no wheezes. He has no rhonchi. He has no rales.  Abdominal: Soft. Bowel sounds are normal. He exhibits no distension. There is no tenderness. There is no rebound and no guarding.  Musculoskeletal: Normal range of motion. He exhibits no edema or tenderness.  Lymphadenopathy:    He has no cervical adenopathy.  Neurological: He is alert.  Skin: Skin is warm and intact. No rash noted. He is diaphoretic. No erythema.  Psychiatric: He has a normal mood and affect. His speech  is normal and behavior is normal. Judgment and thought content normal. Cognition and memory are normal.    LABORATORY PANEL:   CBC Recent Labs  Lab 10/15/17 0008  WBC 12.9*  HGB 17.5  HCT 53.0*  PLT 226   ------------------------------------------------------------------------------------------------------------------  Chemistries  Recent Labs  Lab 10/15/17 0008  NA 134*  K 4.7  CL 99*  CO2 19*  GLUCOSE 686*  BUN 28*  CREATININE 1.29*  CALCIUM 8.9  MG 1.9  AST 33  ALT 26  ALKPHOS 195*  BILITOT 1.1   ------------------------------------------------------------------------------------------------------------------  Cardiac Enzymes Recent Labs  Lab 10/15/17 0008  TROPONINI 0.11*   ------------------------------------------------------------------------------------------------------------------  RADIOLOGY:  Dg Chest Port 1 View  Result Date: 10/15/2017 CLINICAL DATA:  Wound to the right lower leg, AFib EXAM: PORTABLE CHEST 1 VIEW COMPARISON:  01/12/2017, 06/03/2016 FINDINGS: Mild cardiomegaly. Diffuse increased interstitial opacity, suspicious for acute interstitial inflammation or edema. No large effusion. Aortic atherosclerosis. No pneumothorax. IMPRESSION: 1. Cardiomegaly with vascular congestion and mild to moderate diffuse increased interstitial opacity, favored to represent interstitial edema. 2. No significant effusion or focal airspace disease. Electronically  Signed   By: Donavan Foil M.D.   On: 10/15/2017 01:00      IMPRESSION AND PLAN:   A/P: 83M AF + RVR, unctrl T2IDDM/HHS/hyperglycemia/glycosuria, SOB/respiratory failure, hyponatremia, Cr elevation, BNP elevation, Troponin elevation, leukocytosis/polycythemia/hemoconcentration, dehydration, subtherapeutic digoxin level. -AF + RVR and T2IDDM w/ HHS/hyperglycemia/glycosuria all 2/2 noncompliance -SOB/respiratory failure likely 2/2 tachycardia w/ decreased diastolic filling time, reduced cardiac output, resulting in demand/strain, exacerbated by dehydration 2/2 hyperglycemia/glycosuria -Started on insulin gtt, BiPAP in ED -Tele, cardiac monitoring -Pulse ox -Heart rate control priority; Digoxin loading, Cardizem gtt, resume home rate ctrl meds -Would have preferred to use Amiodarone, but has documented iodine allergies (multiple) -Corrected Na+ WNL -Cr elevation 2/2 CKD (in setting of DM), at baseline, no AKI -BNP and Troponin elevation likely 2/2 demand/strain, rate ctrl as discussed above -IVF -c/w home meds -Resume diet once glucose improved -Xarelto -Full code -Admission, > 2 midnights   All the records are reviewed and case discussed with ED provider. Management plans discussed with the patient, family and they are in agreement.  CODE STATUS: Full code  TOTAL TIME TAKING CARE OF THIS PATIENT: 90 minutes.    Arta Silence M.D on 10/15/2017 at 5:28 AM  Between 7am to 6pm - Pager - [phone]  After 6pm go to [email or web address] - Proofreader  [facility]  Office  [phone]  CC: Primary care physician; Alanson Aly, FNP   Note: This dictation was prepared with Dragon dictation along with smaller phrase technology. Any transcriptional errors that result from this process are unintentional.

## 2017-10-15 NOTE — Progress Notes (Signed)
Patient taken off BiPAP and placed on 4L nasal cannula per Dr. Celesta Aver.  Patient alert and talking.  Patient verbalized that he felt better off BiPAP.

## 2017-10-15 NOTE — Consult Note (Signed)
[facility] Nurse wound consult note Reason for Consult: full thickness wound on the anterior right LE. According to wife, patient was seen last week by Dr. Elvina Mattes (podiatry) and has a follow up appointment on Tuesday. We will implement a conservative POC and patient can return to Dr. Elvina Mattes upon discharge. Wound type: Trauma vs arterial insufficiency Pressure Injury POA: N/A Measurement:1.5cm x 2.5cm with depth obscured by the presence of non viable tissue (eschar) Wound bed:See above Drainage (amount, consistency, odor) None Periwound: periwound erythema measuring 5cm x 5cm with no warmth, no induration Dressing procedure/placement/frequency: I will implement once-daily collagenase ointment topped with a saline dressing and secured without using medical adhesives on the skin (e.g., securing with roll gauze).    [facility] nursing team will not follow, but will remain available to this patient, the nursing and medical teams.  Please re-consult if needed. Thanks, Maudie Flakes, MSN, RN, [place], Arther Abbott  Pager# [phone]

## 2017-10-16 DIAGNOSIS — I255 Ischemic cardiomyopathy: Secondary | ICD-10-CM

## 2017-10-16 DIAGNOSIS — I5023 Acute on chronic systolic (congestive) heart failure: Secondary | ICD-10-CM

## 2017-10-16 LAB — CBC WITH DIFFERENTIAL/PLATELET
BASOS PCT: 1 %
Basophils Absolute: 0.1 10*3/uL (ref 0–0.1)
EOS ABS: 0.1 10*3/uL (ref 0–0.7)
Eosinophils Relative: 1 %
HCT: 44.8 % (ref 40.0–52.0)
Hemoglobin: 14.5 g/dL (ref 13.0–18.0)
Lymphocytes Relative: 9 %
Lymphs Abs: 1.2 10*3/uL (ref 1.0–3.6)
MCH: 27.1 pg (ref 26.0–34.0)
MCHC: 32.3 g/dL (ref 32.0–36.0)
MCV: 84 fL (ref 80.0–100.0)
Monocytes Absolute: 0.7 10*3/uL (ref 0.2–1.0)
Monocytes Relative: 5 %
NEUTROS ABS: 11.5 10*3/uL — AB (ref 1.4–6.5)
NEUTROS PCT: 86 %
PLATELETS: 181 10*3/uL (ref 150–440)
RBC: 5.34 MIL/uL (ref 4.40–5.90)
RDW: 18.2 % — AB (ref 11.5–14.5)
WBC: 13.5 10*3/uL — AB (ref 3.8–10.6)

## 2017-10-16 LAB — TROPONIN I
TROPONIN I: 7.08 ng/mL — AB (ref <0.03)
Troponin I: 10.68 ng/mL (ref <0.03)
Troponin I: 7.08 ng/mL (ref <0.03)

## 2017-10-16 LAB — HEPARIN LEVEL (UNFRACTIONATED)
Heparin Unfractionated: 0.19 IU/mL — ABNORMAL LOW (ref 0.30–0.70)
Heparin Unfractionated: 0.92 IU/mL — ABNORMAL HIGH (ref 0.30–0.70)

## 2017-10-16 LAB — GLUCOSE, CAPILLARY
GLUCOSE-CAPILLARY: 139 mg/dL — AB (ref 65–99)
GLUCOSE-CAPILLARY: 187 mg/dL — AB (ref 65–99)
GLUCOSE-CAPILLARY: 197 mg/dL — AB (ref 65–99)
GLUCOSE-CAPILLARY: 97 mg/dL (ref 65–99)
GLUCOSE-CAPILLARY: 103 mg/dL — AB (ref 65–99)
GLUCOSE-CAPILLARY: 113 mg/dL — AB (ref 65–99)
GLUCOSE-CAPILLARY: 165 mg/dL — AB (ref 65–99)
GLUCOSE-CAPILLARY: 135 mg/dL — AB (ref 65–99)
GLUCOSE-CAPILLARY: 100 mg/dL — AB (ref 65–99)
GLUCOSE-CAPILLARY: 99 mg/dL (ref 65–99)
Glucose-Capillary: 103 mg/dL — ABNORMAL HIGH (ref 65–99)
Glucose-Capillary: 127 mg/dL — ABNORMAL HIGH (ref 65–99)
Glucose-Capillary: 134 mg/dL — ABNORMAL HIGH (ref 65–99)
Glucose-Capillary: 135 mg/dL — ABNORMAL HIGH (ref 65–99)
Glucose-Capillary: 151 mg/dL — ABNORMAL HIGH (ref 65–99)
Glucose-Capillary: 158 mg/dL — ABNORMAL HIGH (ref 65–99)
Glucose-Capillary: 222 mg/dL — ABNORMAL HIGH (ref 65–99)
Glucose-Capillary: 229 mg/dL — ABNORMAL HIGH (ref 65–99)

## 2017-10-16 LAB — BASIC METABOLIC PANEL
ANION GAP: 9 (ref 5–15)
BUN: 34 mg/dL — ABNORMAL HIGH (ref 6–20)
CALCIUM: 8.4 mg/dL — AB (ref 8.9–10.3)
CO2: 23 mmol/L (ref 22–32)
Chloride: 106 mmol/L (ref 101–111)
Creatinine, Ser: 1.37 mg/dL — ABNORMAL HIGH (ref 0.61–1.24)
GFR calc Af Amer: 55 mL/min — ABNORMAL LOW (ref >60)
GFR calc non Af Amer: 47 mL/min — ABNORMAL LOW (ref >60)
Glucose, Bld: 206 mg/dL — ABNORMAL HIGH (ref 65–99)
POTASSIUM: 4.3 mmol/L (ref 3.5–5.1)
SODIUM: 138 mmol/L (ref 135–145)

## 2017-10-16 LAB — BRAIN NATRIURETIC PEPTIDE: B Natriuretic Peptide: 3185 pg/mL — ABNORMAL HIGH (ref 0.0–100.0)

## 2017-10-16 LAB — ECHOCARDIOGRAM COMPLETE
HEIGHTINCHES: 67 in
Weight: 2828.9427 oz

## 2017-10-16 LAB — LIPID PANEL
Cholesterol: 108 mg/dL (ref 0–200)
HDL: 37 mg/dL — AB (ref >40)
LDL CALC: 62 mg/dL (ref 0–99)
Total CHOL/HDL Ratio: 2.9 RATIO
Triglycerides: 47 mg/dL (ref <150)
VLDL: 9 mg/dL (ref 0–40)

## 2017-10-16 LAB — LACTIC ACID, PLASMA
LACTIC ACID, VENOUS: 2.7 mmol/L — AB (ref 0.5–1.9)
Lactic Acid, Venous: 1.4 mmol/L (ref 0.5–1.9)

## 2017-10-16 LAB — DIGOXIN LEVEL: Digoxin Level: 1.1 ng/mL (ref 0.8–2.0)

## 2017-10-16 MED ORDER — DILTIAZEM HCL-DEXTROSE 100-5 MG/100ML-% IV SOLN (PREMIX)
5.0000 mg/h | INTRAVENOUS | Status: DC
  Administered 2017-10-16: 10 mg/h via INTRAVENOUS
  Administered 2017-10-16 – 2017-10-18 (×3): 5 mg/h via INTRAVENOUS
  Filled 2017-10-16 (×4): qty 100

## 2017-10-16 MED ORDER — INSULIN ASPART 100 UNIT/ML ~~LOC~~ SOLN
0.0000 [IU] | SUBCUTANEOUS | Status: DC
  Administered 2017-10-16: 2 [IU] via SUBCUTANEOUS
  Administered 2017-10-16: 3 [IU] via SUBCUTANEOUS
  Administered 2017-10-17: 2 [IU] via SUBCUTANEOUS
  Administered 2017-10-17: 5 [IU] via SUBCUTANEOUS
  Administered 2017-10-17: 3 [IU] via SUBCUTANEOUS
  Administered 2017-10-17: 2 [IU] via SUBCUTANEOUS
  Administered 2017-10-18 (×2): 5 [IU] via SUBCUTANEOUS
  Administered 2017-10-18: 8 [IU] via SUBCUTANEOUS
  Administered 2017-10-18: 5 [IU] via SUBCUTANEOUS
  Administered 2017-10-18: 2 [IU] via SUBCUTANEOUS
  Administered 2017-10-19 (×2): 3 [IU] via SUBCUTANEOUS
  Administered 2017-10-19 (×3): 8 [IU] via SUBCUTANEOUS
  Administered 2017-10-20: 11 [IU] via SUBCUTANEOUS
  Administered 2017-10-20: 2 [IU] via SUBCUTANEOUS
  Filled 2017-10-16 (×19): qty 1

## 2017-10-16 MED ORDER — FAMOTIDINE 20 MG PO TABS
20.0000 mg | ORAL_TABLET | Freq: Two times a day (BID) | ORAL | Status: DC
Start: 2017-10-16 — End: 2017-10-28
  Administered 2017-10-16 – 2017-10-28 (×18): 20 mg via ORAL
  Filled 2017-10-16 (×20): qty 1

## 2017-10-16 MED ORDER — DIGOXIN 0.25 MG/ML IJ SOLN
0.2500 mg | Freq: Once | INTRAMUSCULAR | Status: AC
  Administered 2017-10-16: 0.25 mg via INTRAVENOUS
  Filled 2017-10-16: qty 2

## 2017-10-16 MED ORDER — METOPROLOL TARTRATE 5 MG/5ML IV SOLN
5.0000 mg | Freq: Once | INTRAVENOUS | Status: AC
  Administered 2017-10-16: 5 mg via INTRAVENOUS
  Filled 2017-10-16: qty 5

## 2017-10-16 MED ORDER — FUROSEMIDE 10 MG/ML IJ SOLN
40.0000 mg | Freq: Every day | INTRAMUSCULAR | Status: DC
  Administered 2017-10-16 – 2017-10-24 (×8): 40 mg via INTRAVENOUS
  Filled 2017-10-16 (×8): qty 4

## 2017-10-16 MED ORDER — HEPARIN BOLUS VIA INFUSION
2400.0000 [IU] | Freq: Once | INTRAVENOUS | Status: AC
  Administered 2017-10-16: 2400 [IU] via INTRAVENOUS
  Filled 2017-10-16: qty 2400

## 2017-10-16 MED ORDER — DILTIAZEM HCL 100 MG IV SOLR: 5.0000 mg/h | INTRAVENOUS | Status: DC

## 2017-10-16 MED ORDER — DILTIAZEM LOAD VIA INFUSION
10.0000 mg | Freq: Once | INTRAVENOUS | Status: AC
  Administered 2017-10-16: 10 mg via INTRAVENOUS
  Filled 2017-10-16: qty 10

## 2017-10-16 NOTE — Progress Notes (Signed)
ANTICOAGULATION CONSULT NOTE - Follow Up Consult  Pharmacy Consult for heparin gtt Indication: chest pain/ACS  Allergies  Allergen Reactions  . Contrast Media [Iodinated Diagnostic Agents] Shortness Of Breath    SOB  . Iohexol Shortness Of Breath     Desc: Respiratory Distress, Laryngedema, diaphoresis, Onset Date: 16109604   . Metrizamide Shortness Of Breath    SOB SOB SOB   . Latex Rash    Patient Measurements: Height: 5\' 7"  (170.2 cm) Weight: 176 lb 12.9 oz (80.2 kg) IBW/kg (Calculated) : 66.1 Heparin Dosing Weight: 80.2kg  Vital Signs: Temp: 98.4 F (36.9 C) (05/26 0800) Temp Source: Oral (05/26 0800) BP: 102/80 (05/26 0600) Pulse Rate: 130 (05/26 0800)  Labs: Recent Labs    10/15/17 0008 10/15/17 1303 10/15/17 1309 10/15/17 1541 10/15/17 2239 10/16/17 0523 10/16/17 0806  HGB 17.5  --   --   --   --  14.5  --   HCT 53.0*  --   --   --   --  44.8  --   PLT 226  --   --   --   --  181  --   APTT  --   --   --  27 48*  --   --   LABPROT 14.4  --   --  14.5  --   --   --   INR 1.13  --   --  1.14  --   --   --   HEPARINUNFRC  --   --   --   --  0.56  --  0.19*  CREATININE 1.29*  --  1.11  --   --  1.37*  --   TROPONINI 0.11* 17.51*  --   --   --  10.68*  --     Estimated Creatinine Clearance: 43.6 mL/min (A) (by C-G formula based on SCr of 1.37 mg/dL (H)).   Medical History: Past Medical History:  Diagnosis Date  . Arthritis   . Atrial fibrillation ([place])   . Carcinoma of prostate ([place])   . CHF (congestive heart failure) ([place])   . Chronic kidney disease   . Diabetes mellitus without complication ([place])   . ED (erectile dysfunction)   . Frequent urination   . Hyperlipidemia   . Hypertension   . Moderate mitral insufficiency   . Peripheral vascular disease ([place])   . Pneumonia 04/2016  . Prostate cancer ([place])   . Sleep apnea    OSA--USE C-PAP  . Ulcer of left foot due to type 2 diabetes mellitus ([place])   . Urinary stress incontinence, male      Medications:  Facility-Administered Medications Prior to Admission  Medication Dose Route Frequency Provider Last Rate Last Dose  . docusate sodium (COLACE) capsule 100 mg  100 mg Oral BID Stegmayer, Kimberly A, PA-C      . heparin injection 5,000 Units  5,000 Units Subcutaneous Q8H Stegmayer, Kimberly A, PA-C      . piperacillin-tazobactam (ZOSYN) 3.375 g in dextrose 5 % 50 mL IVPB  3.375 g Intravenous Q6H Stegmayer, Kimberly A, PA-C      . senna (SENOKOT) tablet 8.6 mg  1 tablet Oral Daily PRN Stegmayer, Kimberly A, PA-C       Medications Prior to Admission  Medication Sig Dispense Refill Last Dose  . aspirin EC 81 MG tablet Take 81 mg by mouth daily.   Unknown at Unknown  . diltiazem (CARDIZEM CD) 240 MG 24 hr capsule Take 300 mg  by mouth daily.    Unknown at Unknown  . docusate sodium (COLACE) 100 MG capsule Take 100 mg by mouth 2 (two) times daily.   Unknown at Unknown  . famotidine (PEPCID) 20 MG tablet Take 20 mg by mouth 2 (two) times daily.   Unknown at Unknown  . insulin aspart (NOVOLOG) 100 UNIT/ML injection Inject 0-5 Units into the skin 3 (three) times daily with meals.    Unknown at Unknown  . insulin glargine (LANTUS) 100 UNIT/ML injection Inject 0.1 mLs (10 Units total) into the skin at bedtime. 10 mL 11 Unknown at Unknown  . JARDIANCE 10 MG TABS tablet Take 10 mg by mouth daily.    Unknown at Unknown  . lamoTRIgine (LAMICTAL) 25 MG tablet Take 100 mg by mouth 2 (two) times daily.    Unknown at Unknown  . meloxicam (MOBIC) 7.5 MG tablet Take 7.5 mg by mouth daily.   Unknown at Unknown  . metFORMIN (GLUCOPHAGE) 1000 MG tablet TAKE 1 TABLET TWICE A DAY WITH MEALS   Unknown at Unknown  . naproxen sodium (ALEVE) 220 MG tablet Take 220 mg by mouth 2 (two) times daily as needed.   prn at prn  . oxyCODONE-acetaminophen (PERCOCET/ROXICET) 5-325 MG tablet Take 1 tablet by mouth every 6 (six) hours as needed for severe pain.   prn at prn  . simethicone (MYLICON) 80 MG chewable  tablet Chew 80 mg by mouth every 6 (six) hours as needed for flatulence.   prn at prn  . atorvastatin (LIPITOR) 80 MG tablet Take 80 mg by mouth daily at 6 PM.    Not Taking at Unknown time  . digoxin (LANOXIN) 0.125 MG tablet Take 1 tablet (0.125 mg total) by mouth daily. (Patient not taking: Reported on 07/22/2017)   Not Taking  . furosemide (LASIX) 20 MG tablet Take 1 tablet (20 mg total) by mouth daily. (Patient not taking: Reported on 10/15/2017) 30 tablet 0 Not Taking  . lubiprostone (AMITIZA) 24 MCG capsule Take by mouth.   Not Taking at Unknown time  . mirabegron ER (MYRBETRIQ) 25 MG TB24 tablet Take 1 tablet (25 mg total) by mouth daily. (Patient not taking: Reported on 10/15/2017) 30 tablet 0 Not Taking  . oxyCODONE (OXY IR/ROXICODONE) 5 MG immediate release tablet Take 1 tablet (5 mg total) by mouth every 6 (six) hours as needed for moderate pain. (Patient not taking: Reported on 07/22/2017) 30 tablet 0 Not Taking  . oxyCODONE (OXYCONTIN) 10 mg 12 hr tablet Take 1 tablet (10 mg total) by mouth every 12 (twelve) hours. (Patient not taking: Reported on 07/22/2017) 14 tablet 0 Not Taking  . potassium chloride SA (K-DUR,KLOR-CON) 20 MEQ tablet Take by mouth.   Not Taking at Unknown time  . Rivaroxaban (XARELTO) 15 MG TABS tablet Take by mouth.   Not Taking at Unknown time  . silver sulfADIAZINE (SILVADENE) 1 % cream Apply topically daily. (Patient not taking: Reported on 10/15/2017) 50 g 0 Not Taking at Unknown time  . TOPROL XL 100 MG 24 hr tablet Take 100 mg by mouth daily.   Not Taking at Unknown time  . TRULICITY 3.38 SN/0.5LZ SOPN 0.75 mg once a week.   Not Taking at Unknown time   Scheduled:  . aspirin  81 mg Oral Daily  . atorvastatin  80 mg Oral q1800  . collagenase   Topical Daily  . digoxin  0.125 mg Oral Daily  . diltiazem  300 mg Oral Daily  . diltiazem  10 mg Intravenous Once  . docusate sodium  100 mg Oral Daily  . heparin  2,400 Units Intravenous Once  . insulin glargine  15 Units  Subcutaneous QHS  . lamoTRIgine  100 mg Oral BID  . mouth rinse  15 mL Mouth Rinse BID  . metoprolol succinate  100 mg Oral Daily   Infusions:  . diltiazem (CARDIZEM) infusion Stopped (10/15/17 1052)  . diltiazem (CARDIZEM) infusion    . famotidine (PEPCID) IV Stopped (10/15/17 2240)  . heparin 950 Units/hr (10/15/17 1915)  . insulin (NOVOLIN-R) infusion 3.9 Units/hr (10/16/17 0648)   PRN: acetaminophen, bisacodyl, dextrose, metoprolol tartrate, morphine injection **OR** morphine injection, ondansetron (ZOFRAN) IV, senna-docusate Anti-infectives (From admission, onward)   None      Assessment: 83 year old male requiring heparin gtt, previously on xarelto 20mg  po q1800 for afib.  Goal of Therapy:  Heparin level 0.3-0.7 units/ml aPTT 66-102 seconds Monitor platelets by anticoagulation protocol: Yes   Plan:  05/26 @ 0806  HL 0.19. Ordered a bolus of Heparin 2400 units. Increasaed drip rate to 1250 units/hr. Recheck HL tonight @ 1700.  Olivia Canter, [facility] Clinical Pharmacist 10/16/2017, 8:55 AM

## 2017-10-16 NOTE — Progress Notes (Deleted)
Maggie, NP notified of EKG results.

## 2017-10-16 NOTE — Progress Notes (Signed)
ANTICOAGULATION CONSULT NOTE - Follow Up Consult  Pharmacy Consult for heparin gtt Indication: chest pain/ACS  Allergies  Allergen Reactions  . Contrast Media [Iodinated Diagnostic Agents] Shortness Of Breath    SOB  . Iohexol Shortness Of Breath     Desc: Respiratory Distress, Laryngedema, diaphoresis, Onset Date: [id number]   . Metrizamide Shortness Of Breath    SOB SOB SOB   . Latex Rash    Patient Measurements: Height: 5\' 7"  (170.2 cm) Weight: 176 lb 12.9 oz (80.2 kg) IBW/kg (Calculated) : 66.1 Heparin Dosing Weight: 80.2kg  Vital Signs: Temp: 97.6 F (36.4 C) (05/26 1600) Temp Source: Axillary (05/26 1600) BP: 106/72 (05/26 1700) Pulse Rate: 32 (05/26 1800)  Labs: Recent Labs    10/15/17 0008 10/15/17 1303 10/15/17 1309 10/15/17 1541 10/15/17 2239 10/16/17 0523 10/16/17 0806 10/16/17 1422 10/16/17 1701  HGB 17.5  --   --   --   --  14.5  --   --   --   HCT 53.0*  --   --   --   --  44.8  --   --   --   PLT 226  --   --   --   --  181  --   --   --   APTT  --   --   --  27 48*  --   --   --   --   LABPROT 14.4  --   --  14.5  --   --   --   --   --   INR 1.13  --   --  1.14  --   --   --   --   --   HEPARINUNFRC  --   --   --   --  0.56  --  0.19*  --  0.92*  CREATININE 1.29*  --  1.11  --   --  1.37*  --   --   --   TROPONINI 0.11* 17.51*  --   --   --  10.68*  --  7.08*  --     Estimated Creatinine Clearance: 43.6 mL/min (A) (by C-G formula based on SCr of 1.37 mg/dL (H)).   Medical History: Past Medical History:  Diagnosis Date  . Arthritis   . Atrial fibrillation ([place])   . Carcinoma of prostate ([place])   . CHF (congestive heart failure) ([place])   . Chronic kidney disease   . Diabetes mellitus without complication ([place])   . ED (erectile dysfunction)   . Frequent urination   . Hyperlipidemia   . Hypertension   . Moderate mitral insufficiency   . Peripheral vascular disease ([place])   . Pneumonia 04/2016  . Prostate cancer ([place])   . Sleep apnea     OSA--USE C-PAP  . Ulcer of left foot due to type 2 diabetes mellitus ([place])   . Urinary stress incontinence, male     Medications:  Facility-Administered Medications Prior to Admission  Medication Dose Route Frequency Provider Last Rate Last Dose  . docusate sodium (COLACE) capsule 100 mg  100 mg Oral BID Stegmayer, Kimberly A, PA-C      . heparin injection 5,000 Units  5,000 Units Subcutaneous Q8H Stegmayer, Kimberly A, PA-C      . piperacillin-tazobactam (ZOSYN) 3.375 g in dextrose 5 % 50 mL IVPB  3.375 g Intravenous Q6H Stegmayer, Kimberly A, PA-C      . senna (SENOKOT) tablet 8.6 mg  1 tablet Oral  Daily PRN Stegmayer, Janalyn Harder, PA-C       Medications Prior to Admission  Medication Sig Dispense Refill Last Dose  . aspirin EC 81 MG tablet Take 81 mg by mouth daily.   Unknown at Unknown  . diltiazem (CARDIZEM CD) 240 MG 24 hr capsule Take 300 mg by mouth daily.    Unknown at Unknown  . docusate sodium (COLACE) 100 MG capsule Take 100 mg by mouth 2 (two) times daily.   Unknown at Unknown  . famotidine (PEPCID) 20 MG tablet Take 20 mg by mouth 2 (two) times daily.   Unknown at Unknown  . insulin aspart (NOVOLOG) 100 UNIT/ML injection Inject 0-5 Units into the skin 3 (three) times daily with meals.    Unknown at Unknown  . insulin glargine (LANTUS) 100 UNIT/ML injection Inject 0.1 mLs (10 Units total) into the skin at bedtime. 10 mL 11 Unknown at Unknown  . JARDIANCE 10 MG TABS tablet Take 10 mg by mouth daily.    Unknown at Unknown  . lamoTRIgine (LAMICTAL) 25 MG tablet Take 100 mg by mouth 2 (two) times daily.    Unknown at Unknown  . meloxicam (MOBIC) 7.5 MG tablet Take 7.5 mg by mouth daily.   Unknown at Unknown  . metFORMIN (GLUCOPHAGE) 1000 MG tablet TAKE 1 TABLET TWICE A DAY WITH MEALS   Unknown at Unknown  . naproxen sodium (ALEVE) 220 MG tablet Take 220 mg by mouth 2 (two) times daily as needed.   prn at prn  . oxyCODONE-acetaminophen (PERCOCET/ROXICET) 5-325 MG tablet Take 1  tablet by mouth every 6 (six) hours as needed for severe pain.   prn at prn  . simethicone (MYLICON) 80 MG chewable tablet Chew 80 mg by mouth every 6 (six) hours as needed for flatulence.   prn at prn  . atorvastatin (LIPITOR) 80 MG tablet Take 80 mg by mouth daily at 6 PM.    Not Taking at Unknown time  . digoxin (LANOXIN) 0.125 MG tablet Take 1 tablet (0.125 mg total) by mouth daily. (Patient not taking: Reported on 07/22/2017)   Not Taking  . furosemide (LASIX) 20 MG tablet Take 1 tablet (20 mg total) by mouth daily. (Patient not taking: Reported on 10/15/2017) 30 tablet 0 Not Taking  . lubiprostone (AMITIZA) 24 MCG capsule Take by mouth.   Not Taking at Unknown time  . mirabegron ER (MYRBETRIQ) 25 MG TB24 tablet Take 1 tablet (25 mg total) by mouth daily. (Patient not taking: Reported on 10/15/2017) 30 tablet 0 Not Taking  . oxyCODONE (OXY IR/ROXICODONE) 5 MG immediate release tablet Take 1 tablet (5 mg total) by mouth every 6 (six) hours as needed for moderate pain. (Patient not taking: Reported on 07/22/2017) 30 tablet 0 Not Taking  . oxyCODONE (OXYCONTIN) 10 mg 12 hr tablet Take 1 tablet (10 mg total) by mouth every 12 (twelve) hours. (Patient not taking: Reported on 07/22/2017) 14 tablet 0 Not Taking  . potassium chloride SA (K-DUR,KLOR-CON) 20 MEQ tablet Take by mouth.   Not Taking at Unknown time  . Rivaroxaban (XARELTO) 15 MG TABS tablet Take by mouth.   Not Taking at Unknown time  . silver sulfADIAZINE (SILVADENE) 1 % cream Apply topically daily. (Patient not taking: Reported on 10/15/2017) 50 g 0 Not Taking at Unknown time  . TOPROL XL 100 MG 24 hr tablet Take 100 mg by mouth daily.   Not Taking at Unknown time  . TRULICITY 0.35 KK/9.3GH SOPN 0.75 mg once a  week.   Not Taking at Unknown time   Scheduled:  . aspirin  81 mg Oral Daily  . atorvastatin  80 mg Oral q1800  . collagenase   Topical Daily  . digoxin  0.125 mg Oral Daily  . diltiazem  300 mg Oral Daily  . docusate sodium  100 mg Oral  Daily  . famotidine  20 mg Oral BID  . furosemide  40 mg Intravenous Daily  . insulin aspart  0-15 Units Subcutaneous Q4H  . insulin glargine  15 Units Subcutaneous QHS  . lamoTRIgine  100 mg Oral BID  . mouth rinse  15 mL Mouth Rinse BID  . metoprolol succinate  100 mg Oral Daily   Infusions:  . dilTIAZem HCl-Dextrose 10 mg/hr (10/16/17 1800)  . heparin 1,250 Units/hr (10/16/17 1800)   PRN: acetaminophen, bisacodyl, dextrose, metoprolol tartrate, ondansetron (ZOFRAN) IV, senna-docusate Anti-infectives (From admission, onward)   None      Assessment: 83 year old male requiring heparin gtt, previously on xarelto 20mg  po q1800 for afib.  Goal of Therapy:  Heparin level 0.3-0.7 units/ml aPTT 66-102 seconds Monitor platelets by anticoagulation protocol: Yes   Plan:  05/26 @ 0806  HL 0.19. Ordered a bolus of Heparin 2400 units. Increasaed drip rate to 1250 units/hr. Recheck HL tonight @ 1700.  5/26@1830  Heparin level 0.91, reducing to heparin iv 1000 units/hr recheck in 6 hours and cbc tomorrow.   Donna Christen Rhemi Balbach, [facility] Clinical Pharmacist 10/16/2017, 8:55 AM

## 2017-10-16 NOTE — Consult Note (Signed)
[place] Nurse wound consult note Reason for Consult: Patient consult received for Left LE wound.  Saw yesterday for Right LE wound and patient has an amputation on the left.  Spoke with Bedside RN and she believes that consult was placed in error. Order discontinued.  [place] nursing team will not follow, but will remain available to this patient, the nursing and medical teams.  Please re-consult if needed. Thanks, Maudie Flakes, MSN, RN, [place], Arther Abbott  Pager# [phone]

## 2017-10-16 NOTE — Progress Notes (Signed)
[facility] [place] NAME: [name]    MR#:  [id number]  DATE OF BIRTH:  1934/12/11  SUBJECTIVE:  CHIEF COMPLAINT:   Chief Complaint  Patient presents with  . Tachycardia  . Hyperglycemia    Came with hypoglycemia, atrial fibrillation and shortness of breath   Again on bipap due to respi distress, troponin rise and now coming down.  REVIEW OF SYSTEMS:  CONSTITUTIONAL: No fever, fatigue or weakness.  EYES: No blurred or double vision.  EARS, NOSE, AND THROAT: No tinnitus or ear pain.  RESPIRATORY: No cough,have shortness of breath,no wheezing or hemoptysis.  CARDIOVASCULAR: No chest pain, orthopnea,have edema.  GASTROINTESTINAL: No nausea, vomiting, diarrhea or abdominal pain.  GENITOURINARY: No dysuria, hematuria.  ENDOCRINE: No polyuria, nocturia,  HEMATOLOGY: No anemia, easy bruising or bleeding SKIN: No rash or lesion. MUSCULOSKELETAL: No joint pain or arthritis.   NEUROLOGIC: No tingling, numbness, weakness.  PSYCHIATRY: No anxiety or depression.   ROS  DRUG ALLERGIES:   Allergies  Allergen Reactions  . Contrast Media [Iodinated Diagnostic Agents] Shortness Of Breath    SOB  . Iohexol Shortness Of Breath     Desc: Respiratory Distress, Laryngedema, diaphoresis, Onset Date: [id number]   . Metrizamide Shortness Of Breath    SOB SOB SOB   . Latex Rash    VITALS:  Blood pressure 90/61, pulse (!) 48, temperature 98.4 F (36.9 C), temperature source Oral, resp. rate 14, height 5\' 7"  (1.702 m), weight 80.2 kg (176 lb 12.9 oz), SpO2 95 %.  PHYSICAL EXAMINATION:  GENERAL:  83 y.o.-year-old patient lying in the bed with  acute respiratory distress.  EYES: Pupils equal, round, reactive to light and accommodation. No scleral icterus. Extraocular muscles intact.  HEENT: Head atraumatic, normocephalic. Oropharynx and nasopharynx clear.  NECK:  Supple, no jugular venous distention. No thyroid enlargement, no tenderness.   LUNGS: Normal breath sounds bilaterally, no wheezing,bilateral crepitation. positive use of accessory muscles of respiration.  CARDIOVASCULAR: S1, S2 normal. No murmurs, rubs, or gallops.  ABDOMEN: Soft, nontender, nondistended. Bowel sounds present. No organomegaly or mass.  EXTREMITIES: No pedal edema, cyanosis, or clubbing. Left-sided below knee amputation.   Right side at the shin of TBI and there is an injury to the skin with surrounding redness. NEUROLOGIC: Cranial nerves II through XII are intact. Muscle strength 3-4/5 in all extremities. Sensation intact. Gait not checked.  PSYCHIATRIC: The patient is alert and oriented x 3.  SKIN: No obvious rash, lesion, or ulcer.   Physical Exam LABORATORY PANEL:   CBC Recent Labs  Lab 10/16/17 0523  WBC 13.5*  HGB 14.5  HCT 44.8  PLT 181   ------------------------------------------------------------------------------------------------------------------  Chemistries  Recent Labs  Lab 10/15/17 0008 10/15/17 1309 10/16/17 0523  NA 134* 134* 138  K 4.7 4.2 4.3  CL 99* 103 106  CO2 19* 22 23  GLUCOSE 686* 331* 206*  BUN 28* 30* 34*  CREATININE 1.29* 1.11 1.37*  CALCIUM 8.9 8.4* 8.4*  MG 1.9 1.8  --   AST 33  --   --   ALT 26  --   --   ALKPHOS 195*  --   --   BILITOT 1.1  --   --    ------------------------------------------------------------------------------------------------------------------  Cardiac Enzymes Recent Labs  Lab 10/15/17 1303 10/16/17 0523  TROPONINI 17.51* 10.68*   ------------------------------------------------------------------------------------------------------------------  RADIOLOGY:  Dg Chest Port 1 View  Result Date: 10/15/2017 CLINICAL DATA:  Respiratory failure EXAM: PORTABLE CHEST 1 VIEW  COMPARISON:  10/15/2017 FINDINGS: The heart is mildly enlarged. Vascular congestion. Kerley B-lines towards the lung bases are noted bilaterally. Interstitial opacities are present throughout both central  and basilar lung zones. No pneumothorax. IMPRESSION: CHF with interstitial edema Electronically Signed   By: Marybelle Killings M.D.   On: 10/15/2017 14:13   Dg Chest Port 1 View  Result Date: 10/15/2017 CLINICAL DATA:  Wound to the right lower leg, AFib EXAM: PORTABLE CHEST 1 VIEW COMPARISON:  01/12/2017, 06/03/2016 FINDINGS: Mild cardiomegaly. Diffuse increased interstitial opacity, suspicious for acute interstitial inflammation or edema. No large effusion. Aortic atherosclerosis. No pneumothorax. IMPRESSION: 1. Cardiomegaly with vascular congestion and mild to moderate diffuse increased interstitial opacity, favored to represent interstitial edema. 2. No significant effusion or focal airspace disease. Electronically Signed   By: Donavan Foil M.D.   On: 10/15/2017 01:00    ASSESSMENT AND PLAN:   Active Problems:   Atrial fibrillation with rapid ventricular response (HCC)  * non-ST elevation MI   Significant rise in his troponin, currently heparin IV drip and further management per cardiology.   On ASA, betablocker, atorvastatin.  * hyperglycemia- nonketotic   Responded to IV insulin drip, switch to long-acting insulin with sliding scale control.   Again on insuline drip due to Bipap use.  * atrial fibrillation   Likely secondary to stress of hyperglycemia and dehydration.   Currently on digoxin and Cardizem.   Heparin IV drip.  * acute systolic congestive heart failure   This is secondary to atrial fibrillation.   IV Lasix and BiPAP as needed.   Significant drop in EF, compared to past.   All the records are reviewed and case discussed with Care Management/Social Workerr. Management plans discussed with the patient, family and they are in agreement.  CODE STATUS: Full.  TOTAL TIME TAKING CARE OF THIS PATIENT: 35 minutes.    POSSIBLE D/C IN 1-2 DAYS, DEPENDING ON CLINICAL CONDITION.   Vaughan Basta M.D on 10/16/2017   Between 7am to 6pm - Pager - [phone]  After  6pm go to [email or web address] - password [email or web address] [facility]  Office  [phone]  CC: Primary care physician; Alanson Aly, FNP  Note: This dictation was prepared with Dragon dictation along with smaller phrase technology. Any transcriptional errors that result from this process are unintentional.

## 2017-10-16 NOTE — Progress Notes (Signed)
[facility]  SUBJECTIVE: Patient laying in bed, restless, wearing mittens on his hands, denies chest pain   Vitals:   10/16/17 0510 10/16/17 0600 10/16/17 0800 10/16/17 0900  BP: 109/72 102/80  96/65  Pulse: 96 (!) 110 (!) 130 (!) 104  Resp: (!) 23 (!) 25 (!) 30 (!) 29  Temp:   98.4 F (36.9 C)   TempSrc:   Oral   SpO2: 99% 96% (!) 89% 95%  Weight:      Height:         Intake/Output Summary (Last 24 hours) at 10/16/2017 0954 Last data filed at 10/16/2017 0300 Gross per 24 hour  Intake 876.82 ml  Output 1260 ml  Net -383.18 ml      PHYSICAL EXAM  General: Moderate distress HEENT:  Normocephalic and atramatic Neck:  No JVD.  Lungs: Clear bilaterally to auscultation and percussion. Heart: Irregularly irregular rhythm. Normal S1 and S2 without gallops or murmurs.  Abdomen: Bowel sounds are positive, abdomen soft and non-tender  Msk:  Back normal, normal gait. Normal strength and tone for age. Extremities: No clubbing, cyanosis or edema.   Neuro: Alert and oriented X 3, restless Psych:  Good affect, responds appropriately   LABS: Basic Metabolic Panel: Recent Labs    10/15/17 0008 10/15/17 1309 10/16/17 0523  NA 134* 134* 138  K 4.7 4.2 4.3  CL 99* 103 106  CO2 19* 22 23  GLUCOSE 686* 331* 206*  BUN 28* 30* 34*  CREATININE 1.29* 1.11 1.37*  CALCIUM 8.9 8.4* 8.4*  MG 1.9 1.8  --   PHOS 3.8 3.9  --    Liver Function Tests: Recent Labs    10/15/17 0008  AST 33  ALT 26  ALKPHOS 195*  BILITOT 1.1  PROT 8.2*  ALBUMIN 3.8   No results for input(s): LIPASE, AMYLASE in the last 72 hours. CBC: Recent Labs    10/15/17 0008 10/16/17 0523  WBC 12.9* 13.5*  NEUTROABS 10.9* 11.5*  HGB 17.5 14.5  HCT 53.0* 44.8  MCV 84.9 84.0  PLT 226 181   Cardiac Enzymes: Recent Labs    10/15/17 0008 10/15/17 1303 10/16/17 0523  TROPONINI 0.11* 17.51* 10.68*   BNP: Invalid input(s): POCBNP D-Dimer: No results for input(s): DDIMER in the last 72  hours. Hemoglobin A1C: No results for input(s): HGBA1C in the last 72 hours. Fasting Lipid Panel: No results for input(s): CHOL, HDL, LDLCALC, TRIG, CHOLHDL, LDLDIRECT in the last 72 hours. Thyroid Function Tests: No results for input(s): TSH, T4TOTAL, T3FREE, THYROIDAB in the last 72 hours.  Invalid input(s): FREET3 Anemia Panel: No results for input(s): VITAMINB12, FOLATE, FERRITIN, TIBC, IRON, RETICCTPCT in the last 72 hours.  Dg Chest Port 1 View  Result Date: 10/15/2017 CLINICAL DATA:  Respiratory failure EXAM: PORTABLE CHEST 1 VIEW COMPARISON:  10/15/2017 FINDINGS: The heart is mildly enlarged. Vascular congestion. Kerley B-lines towards the lung bases are noted bilaterally. Interstitial opacities are present throughout both central and basilar lung zones. No pneumothorax. IMPRESSION: CHF with interstitial edema Electronically Signed   By: Marybelle Killings M.D.   On: 10/15/2017 14:13   Dg Chest Port 1 View  Result Date: 10/15/2017 CLINICAL DATA:  Wound to the right lower leg, AFib EXAM: PORTABLE CHEST 1 VIEW COMPARISON:  01/12/2017, 06/03/2016 FINDINGS: Mild cardiomegaly. Diffuse increased interstitial opacity, suspicious for acute interstitial inflammation or edema. No large effusion. Aortic atherosclerosis. No pneumothorax. IMPRESSION: 1. Cardiomegaly with vascular congestion and mild to moderate diffuse increased interstitial opacity, favored to represent  interstitial edema. 2. No significant effusion or focal airspace disease. Electronically Signed   By: Donavan Foil M.D.   On: 10/15/2017 01:00     Echo pending  TELEMETRY: Atrial fibrillation 130 bpm:  ASSESSMENT AND PLAN:  Active Problems:   Atrial fibrillation with rapid ventricular response ([place])    1.  Non-ST elevation myocardial infarction, of uncertain clinical significance, in the absence of chest pain or diagnostic ECG changes, in the setting of respiratory failure, atrial fibrillation with rapid ventricular rate, and  marked hyperglycemia.  Patient currently very restless, continues to deny chest pain, currently a candidate for cardiac catheterization. 2.  Atrial fibrillation with rapid ventricular rate, initially improved on Cardizem CD, digoxin and metoprolol, now with rapid ventricular rate, Cardizem drip pending.  Patient previously on Xarelto for stroke prevention, now on heparin drip. 3.  Hyperglycemia on presentation, due to noncompliance, improved after the insulin 4.  Restlessness, combativeness, of uncertain etiology 5.  Respiratory failure, on BiPAP  Recommendations  1.  Continue current medications 2.  Continue heparin drip for now 3.  No indication for emergent cardiac catheterization at this time, in the absence of chest pain or ECG changes, setting of restlessness and combativeness 4.  Review 2D echocardiogram 5.  Treat with Cardizem drip, uptitrate as needed to control ventricular rate   Isaias Cowman, MD, PhD, [facility] 10/16/2017 9:54 AM

## 2017-10-16 NOTE — Progress Notes (Signed)
Pt has had 6 CBG readings less than 180 mg/dL and IV insulin is being held for the second hour per the glucostabilizer. Consulted with Dr. Celesta Aver in reference to transitioning off the insulin gtt. Dr. Celesta Aver stated she was in the process of reviewing charts of the patients on the unit and will determine course of insulin gtt/glucostabilizer once she reviews this pt's information. No new orders at this time.

## 2017-10-16 NOTE — Progress Notes (Signed)
PHARMACIST - PHYSICIAN COMMUNICATION  CONCERNING: IV to Oral Route Change Policy  RECOMMENDATION: This patient is receiving famotidine by the intravenous route.  Based on criteria approved by the Pharmacy and Therapeutics Committee, the intravenous medication(s) is/are being converted to the equivalent oral dose form(s).   DESCRIPTION: These criteria include:  The patient is eating (either orally or via tube) and/or has been taking other orally administered medications for a least 24 hours  The patient has no evidence of active gastrointestinal bleeding or impaired GI absorption (gastrectomy, short bowel, patient on TNA or NPO).  If you have questions about this conversion, please contact the Pharmacy Department  []   [phone] )  [facility] [x]   [phone] )  [facility] []   [phone] )  [name] []   [phone] )  [facility] []   [phone] )  [place], [facility] 10/16/2017 10:17 AM

## 2017-10-16 NOTE — Progress Notes (Signed)
PULMONARY / CRITICAL CARE MEDICINE   Name: [name] MRN: [id number] DOB: 01/11/1935    ADMISSION DATE:  10/14/2017   CONSULTATION DATE:  10/15/2017  REFERRING MD:  Dr. Jodell Cipro  REASON: HHS and Afib with RVR  HISTORY OF PRESENT ILLNESS:   This is an 83 y/o male with a medical history as indicated below who presented to the ED with complaints of shortness of breath and tachycardia.  He was found to be in A. fib with RVR.  His chest x-ray showed mild pulmonary vascular congestion.  His labs showed a blood glucose level of 686 mg/dL.  He was started on an insulin drip and admitted to the ICU for further management.  Patient reports that he has not been completely compliant with his treatment regimen.  He denies chest pain palpitations nausea vomiting diarrhea   REVIEW OF SYSTEMS:   Constitutional: Negative for fever and chills.  HENT: Negative for congestion and rhinorrhea.  Eyes: Negative for redness and visual disturbance.  Respiratory: Negative for shortness of breath and wheezing.  Cardiovascular: Negative for chest pain and palpitations.  Gastrointestinal: Negative  for nausea , vomiting and abdominal pain and  Loose stools Genitourinary: Negative for dysuria and urgency.  Endocrine: Denies polyuria, polyphagia and heat intolerance Musculoskeletal: Negative for myalgias and arthralgias.  Skin: Negative for pallor and wound.  Neurological: Negative for dizziness and headaches   SUBJECTIVE:  Patient reports that he feels better but still feels very ill.  He again denies chest pain or abdominal pain.  His only complaint is dysnoea.  VITAL SIGNS: BP 90/61   Pulse (!) 48   Temp 98.4 F (36.9 C) (Oral)   Resp 14   Ht 5\' 7"  (1.702 m)   Wt 176 lb 12.9 oz (80.2 kg)   SpO2 95%   BMI 27.69 kg/m   HEMODYNAMICS:  Still episodal RVR  VENTILATOR SETTINGS: FiO2 (%):  [50 %] 50 %  INTAKE / OUTPUT: I/O last 3 completed shifts: In: 1637.6 [P.O.:240; I.V.:1347.6; IV  Piggyback:50] Out: 9381 [Urine:1710]  PHYSICAL EXAMINATION: General: No acute distress Neuro: Alert and oriented x3, no focal deficits HEENT: PERRLA Cardiovascular: Apical pulse irregular, tachycardic, S1-S2, no murmur regurg or gallop, +2 pulses bilaterally Lungs: Bilateral rhonchi diffuse, diminished in the bases Abdomen: Nondistended, normal bowel sounds in all 4 quadrants Musculoskeletal:  +ROM, no deformities Skin: warm and dry, mottled right foot with doppler pulse as per nurse; left BKA  LABS:  BMET Recent Labs  Lab 10/15/17 0008 10/15/17 1309 10/16/17 0523  NA 134* 134* 138  K 4.7 4.2 4.3  CL 99* 103 106  CO2 19* 22 23  BUN 28* 30* 34*  CREATININE 1.29* 1.11 1.37*  GLUCOSE 686* 331* 206*    Electrolytes Recent Labs  Lab 10/15/17 0008 10/15/17 1309 10/16/17 0523  CALCIUM 8.9 8.4* 8.4*  MG 1.9 1.8  --   PHOS 3.8 3.9  --     CBC Recent Labs  Lab 10/15/17 0008 10/16/17 0523  WBC 12.9* 13.5*  HGB 17.5 14.5  HCT 53.0* 44.8  PLT 226 181    Coag's Recent Labs  Lab 10/15/17 0008 10/15/17 1541 10/15/17 2239  APTT  --  27 48*  INR 1.13 1.14  --     Sepsis Markers Recent Labs  Lab 10/16/17 0907  LATICACIDVEN 2.7*    ABG No results for input(s): PHART, PCO2ART, PO2ART in the last 168 hours.  Liver Enzymes Recent Labs  Lab 10/15/17 0008  AST 33  ALT 26  ALKPHOS 195*  BILITOT 1.1  ALBUMIN 3.8    Cardiac Enzymes Recent Labs  Lab 10/15/17 0008 10/15/17 1303 10/16/17 0523  TROPONINI 0.11* 17.51* 10.68*    Glucose Recent Labs  Lab 10/16/17 0749 10/16/17 0852 10/16/17 0954 10/16/17 1058 10/16/17 1149 10/16/17 1301  GLUCAP 97 127* 103* 113* 135* 165*    Imaging Dg Chest Port 1 View  Result Date: 10/15/2017 CLINICAL DATA:  Respiratory failure EXAM: PORTABLE CHEST 1 VIEW COMPARISON:  10/15/2017 FINDINGS: The heart is mildly enlarged. Vascular congestion. Kerley B-lines towards the lung bases are noted bilaterally.  Interstitial opacities are present throughout both central and basilar lung zones. No pneumothorax. IMPRESSION: CHF with interstitial edema Electronically Signed   By: Marybelle Killings M.D.   On: 10/15/2017 14:13   SIGNIFICANT EVENTS: 10/15/2017: admitted  LINES/TUBES: PIV's  ASSESSMENT   1. Acute NSTEMI seen by cardiology. Ischemic cardiomyopathy with EF of 20-25%.  Troponin has begun to trend downward. Plan: continue medical management with Heparin drip, ASA, beta blockers  2. Chronic Atrial Fibrillation  with RVR Plan: rate control, will give an additional dose of Digoxin since rate is not fully controlled and patient still requiring Cardizem drip. Xarelto has been discontinued since Patient is on Heparin drip.  3. Acute on Chronic diastolic heart failure Plan: Diuretic therapy with a keen eye on renal function.  4. Acute hypoxic Respiratory failure secondary to # 3. CHF Plan: NIPPV as needed. Wean Oxygen as needed, may try HFO so patient can eat.  5. Poorly controled IDDM secondary to non compliance Plan: transition off Insulin drip  6. Chronic diabetic foot ulcer Plan: continue dressing as per wound care nurse direction  7. Acute on chronic renal impairment stage 2 Plan: monitor renal function  History of prostate cancer, sleep apnea, hypertension, hyperlipidemia, and peripheral vascular disease status post left BKA   FAMILY  - Updates: Will update wife when she arrives  This patient remains critically ill.  He is a full code.  Thank you for allowing me the privilege to care for this patient.  I have dedicated a total of 37 minutes in critical care time minus all appropriate exclusions.   Cammie Sickle, MD Pulmonary and [place] Pager [phone] or [phone]    10/16/2017, 1:33 PM

## 2017-10-17 ENCOUNTER — Inpatient Hospital Stay: Payer: Medicare Other

## 2017-10-17 LAB — LACTIC ACID, PLASMA
LACTIC ACID, VENOUS: 2.4 mmol/L — AB (ref 0.5–1.9)
Lactic Acid, Venous: 1.8 mmol/L (ref 0.5–1.9)

## 2017-10-17 LAB — CBC WITH DIFFERENTIAL/PLATELET
BASOS ABS: 0.1 10*3/uL (ref 0–0.1)
BASOS PCT: 1 %
Eosinophils Absolute: 0.3 10*3/uL (ref 0–0.7)
Eosinophils Relative: 2 %
HEMATOCRIT: 47.6 % (ref 40.0–52.0)
Hemoglobin: 15.9 g/dL (ref 13.0–18.0)
Lymphocytes Relative: 14 %
Lymphs Abs: 1.5 10*3/uL (ref 1.0–3.6)
MCH: 27.8 pg (ref 26.0–34.0)
MCHC: 33.5 g/dL (ref 32.0–36.0)
MCV: 83 fL (ref 80.0–100.0)
MONO ABS: 0.7 10*3/uL (ref 0.2–1.0)
Monocytes Relative: 6 %
NEUTROS ABS: 8.5 10*3/uL — AB (ref 1.4–6.5)
NEUTROS PCT: 77 %
Platelets: 167 10*3/uL (ref 150–440)
RBC: 5.73 MIL/uL (ref 4.40–5.90)
RDW: 18.6 % — AB (ref 11.5–14.5)
WBC: 11 10*3/uL — AB (ref 3.8–10.6)

## 2017-10-17 LAB — GLUCOSE, CAPILLARY
GLUCOSE-CAPILLARY: 222 mg/dL — AB (ref 65–99)
GLUCOSE-CAPILLARY: 243 mg/dL — AB (ref 65–99)
GLUCOSE-CAPILLARY: 93 mg/dL (ref 65–99)
Glucose-Capillary: 130 mg/dL — ABNORMAL HIGH (ref 65–99)
Glucose-Capillary: 144 mg/dL — ABNORMAL HIGH (ref 65–99)
Glucose-Capillary: 174 mg/dL — ABNORMAL HIGH (ref 65–99)

## 2017-10-17 LAB — RENAL FUNCTION PANEL
ALBUMIN: 3.1 g/dL — AB (ref 3.5–5.0)
Anion gap: 12 (ref 5–15)
BUN: 35 mg/dL — AB (ref 6–20)
CHLORIDE: 105 mmol/L (ref 101–111)
CO2: 21 mmol/L — AB (ref 22–32)
Calcium: 8.4 mg/dL — ABNORMAL LOW (ref 8.9–10.3)
Creatinine, Ser: 1.2 mg/dL (ref 0.61–1.24)
GFR, EST NON AFRICAN AMERICAN: 55 mL/min — AB (ref >60)
GLUCOSE: 122 mg/dL — AB (ref 65–99)
PHOSPHORUS: 3.2 mg/dL (ref 2.5–4.6)
POTASSIUM: 3.6 mmol/L (ref 3.5–5.1)
Sodium: 138 mmol/L (ref 135–145)

## 2017-10-17 LAB — HEPARIN LEVEL (UNFRACTIONATED)
Heparin Unfractionated: 0.43 IU/mL (ref 0.30–0.70)
Heparin Unfractionated: 0.41 IU/mL (ref 0.30–0.70)

## 2017-10-17 LAB — TROPONIN I: TROPONIN I: 5.41 ng/mL — AB (ref <0.03)

## 2017-10-17 LAB — CBC
HEMATOCRIT: 42 % (ref 40.0–52.0)
HEMOGLOBIN: 14.1 g/dL (ref 13.0–18.0)
MCH: 27.9 pg (ref 26.0–34.0)
MCHC: 33.7 g/dL (ref 32.0–36.0)
MCV: 83 fL (ref 80.0–100.0)
Platelets: 151 10*3/uL (ref 150–440)
RBC: 5.06 MIL/uL (ref 4.40–5.90)
RDW: 18.3 % — ABNORMAL HIGH (ref 11.5–14.5)
WBC: 9.8 10*3/uL (ref 3.8–10.6)

## 2017-10-17 LAB — MAGNESIUM: Magnesium: 1.9 mg/dL (ref 1.7–2.4)

## 2017-10-17 MED ORDER — SODIUM CHLORIDE 0.9 % IV SOLN: 250.0000 mL | INTRAVENOUS | Status: DC | PRN

## 2017-10-17 MED ORDER — SODIUM CHLORIDE 0.9% FLUSH
3.0000 mL | Freq: Two times a day (BID) | INTRAVENOUS | Status: DC
  Administered 2017-10-17: 3 mL via INTRAVENOUS

## 2017-10-17 MED ORDER — SODIUM CHLORIDE 0.9% FLUSH: 3.0000 mL | INTRAVENOUS | Status: DC | PRN

## 2017-10-17 MED ORDER — ASPIRIN 81 MG PO CHEW
81.0000 mg | CHEWABLE_TABLET | ORAL | Status: AC
  Administered 2017-10-18: 81 mg via ORAL
  Filled 2017-10-17: qty 1

## 2017-10-17 MED ORDER — SODIUM CHLORIDE 0.9 % WEIGHT BASED INFUSION: 3.0000 mL/kg/h | INTRAVENOUS | Status: AC

## 2017-10-17 MED ORDER — FENTANYL CITRATE (PF) 100 MCG/2ML IJ SOLN
12.5000 ug | Freq: Once | INTRAMUSCULAR | Status: AC
  Administered 2017-10-17: 12.5 ug via INTRAVENOUS
  Filled 2017-10-17: qty 2

## 2017-10-17 MED ORDER — SODIUM CHLORIDE 0.9 % WEIGHT BASED INFUSION: 1.0000 mL/kg/h | INTRAVENOUS | Status: DC

## 2017-10-17 MED ORDER — SODIUM CHLORIDE 0.9 % IV BOLUS
250.0000 mL | Freq: Once | INTRAVENOUS | Status: AC
  Administered 2017-10-17: 250 mL via INTRAVENOUS

## 2017-10-17 NOTE — Progress Notes (Signed)
Name: [name] MRN: [id number] DOB: Jan 05, 1935     CONSULTATION DATE: 10/14/2017  Subjective & Objectives: [place] day time, BiPAP at night and PRN. No major issues  Last night  PAST MEDICAL HISTORY :   has a past medical history of Arthritis, Atrial fibrillation ([place]), Carcinoma of prostate ([place]), CHF (congestive heart failure) ([place]), Chronic kidney disease, Diabetes mellitus without complication ([place]), ED (erectile dysfunction), Frequent urination, Hyperlipidemia, Hypertension, Moderate mitral insufficiency, Peripheral vascular disease ([place]), Pneumonia (04/2016), Prostate cancer [facility]), Sleep apnea, Ulcer of left foot due to type 2 diabetes mellitus ([place]), and Urinary stress incontinence, male.  has a past surgical history that includes Prostate surgery; Tonsillectomy; Eye surgery (Bilateral); Cholecystectomy; Lower Extremity Angiography (Left, 08/16/2016); Lower Extremity Angiography (Left, 09/01/2016); LOWER EXTREMITY INTERVENTION (09/01/2016); Irrigation and debridement foot (Left, 10/15/2016); Apligraft placement (Left, 10/15/2016); Lower Extremity Angiography (Left, 10/14/2016); Irrigation and debridement foot (Left, 12/09/2016); Lower Extremity Angiography (Left, 12/20/2016); and Amputation (Left, 01/12/2017). Prior to Admission medications   Medication Sig Start Date End Date Taking? Authorizing Provider  aspirin EC 81 MG tablet Take 81 mg by mouth daily.   Yes [provider]  diltiazem (CARDIZEM CD) 240 MG 24 hr capsule Take 300 mg by mouth daily.    Yes [provider]  docusate sodium (COLACE) 100 MG capsule Take 100 mg by mouth 2 (two) times daily.   Yes [provider]  famotidine (PEPCID) 20 MG tablet Take 20 mg by mouth 2 (two) times daily.   Yes [provider]  insulin aspart (NOVOLOG) 100 UNIT/ML injection Inject 0-5 Units into the skin 3 (three) times daily with meals.    Yes [provider]  insulin glargine (LANTUS) 100 UNIT/ML  injection Inject 0.1 mLs (10 Units total) into the skin at bedtime. 01/19/17  Yes Sainani, Belia Heman, MD  JARDIANCE 10 MG TABS tablet Take 10 mg by mouth daily.  06/28/17  Yes [provider]  lamoTRIgine (LAMICTAL) 25 MG tablet Take 100 mg by mouth 2 (two) times daily.  04/20/17  Yes [provider]  meloxicam (MOBIC) 7.5 MG tablet Take 7.5 mg by mouth daily.   Yes [provider]  metFORMIN (GLUCOPHAGE) 1000 MG tablet TAKE 1 TABLET TWICE A DAY WITH MEALS 11/28/15  Yes [provider]  naproxen sodium (ALEVE) 220 MG tablet Take 220 mg by mouth 2 (two) times daily as needed.   Yes [provider]  oxyCODONE-acetaminophen (PERCOCET/ROXICET) 5-325 MG tablet Take 1 tablet by mouth every 6 (six) hours as needed for severe pain.   Yes [provider]  simethicone (MYLICON) 80 MG chewable tablet Chew 80 mg by mouth every 6 (six) hours as needed for flatulence.   Yes [provider]  atorvastatin (LIPITOR) 80 MG tablet Take 80 mg by mouth daily at 6 PM.     [provider]  digoxin (LANOXIN) 0.125 MG tablet Take 1 tablet (0.125 mg total) by mouth daily. Patient not taking: Reported on 07/22/2017 01/20/17   Henreitta Leber, MD  furosemide (LASIX) 20 MG tablet Take 1 tablet (20 mg total) by mouth daily. Patient not taking: Reported on 10/15/2017 02/13/17   Schnier, Dolores Lory, MD  lubiprostone (AMITIZA) 24 MCG capsule Take by mouth.    [provider]  mirabegron ER (MYRBETRIQ) 25 MG TB24 tablet Take 1 tablet (25 mg total) by mouth daily. Patient not taking: Reported on 10/15/2017 08/19/17   Zara Council A, PA-C  oxyCODONE (OXY IR/ROXICODONE) 5 MG immediate release  tablet Take 1 tablet (5 mg total) by mouth every 6 (six) hours as needed for moderate pain. Patient not taking: Reported on 07/22/2017 01/19/17   Henreitta Leber, MD  oxyCODONE (OXYCONTIN) 10 mg 12 hr tablet Take 1 tablet (10 mg total) by mouth every 12 (twelve) hours. Patient  not taking: Reported on 07/22/2017 01/19/17   Henreitta Leber, MD  potassium chloride SA (K-DUR,KLOR-CON) 20 MEQ tablet Take by mouth. 03/29/17   [provider]  Rivaroxaban (XARELTO) 15 MG TABS tablet Take by mouth.    [provider]  silver sulfADIAZINE (SILVADENE) 1 % cream Apply topically daily. Patient not taking: Reported on 10/15/2017 02/12/17   Schnier, Dolores Lory, MD  TOPROL XL 100 MG 24 hr tablet Take 100 mg by mouth daily. 12/25/16   [provider]  TRULICITY 6.97 XY/8.0XK SOPN 0.75 mg once a week. 11/14/16   [provider]   Allergies  Allergen Reactions  . Contrast Media [Iodinated Diagnostic Agents] Shortness Of Breath    SOB  . Iohexol Shortness Of Breath     Desc: Respiratory Distress, Laryngedema, diaphoresis, Onset Date: [id number]   . Metrizamide Shortness Of Breath    SOB SOB SOB   . Latex Rash    FAMILY HISTORY:  family history includes Diabetes in his mother; Hypertension in his mother. SOCIAL HISTORY:  reports that he quit smoking about 23 years ago. His smoking use included cigarettes. He smoked 0.25 packs per day. He has never used smokeless tobacco. He reports that he does not drink alcohol or use drugs.  REVIEW OF SYSTEMS:   Unable to obtain due to critical illness   VITAL SIGNS: Temp:  [97.2 F (36.2 C)-98.4 F (36.9 C)] 98.2 F (36.8 C) (05/27 0800) Pulse Rate:  [31-111] 69 (05/27 1100) Resp:  [12-30] 23 (05/27 1100) BP: (80-142)/(48-95) 113/76 (05/27 0600) SpO2:  [93 %-100 %] 96 % (05/27 1100) FiO2 (%):  [5 %] 5 % (05/26 1930)  Physical Examination:  A + O and no acute neuro deficits On [place], no distress, able to talk in full sentences, BEAE and no rales. S1 &  S2 audible and no murmur Benign abdomen with normal peristalses Lt. BKA, Rt. Leg ulcer and no edema    ASSESSMENT / PLAN: Acute respiratory failure (improved) tolerating [place] most of the day, requires NPPV at night and PRN.  NSTEMI. ECHO LVEF 20-25%  with anterior, apical and septal akinesis A fib with RVR. On Heparin gtt, Cardizem + Dig + metoprolol  ASA + Statin -Management as per cardiology and considering LHC.  HONK and uncontrolled DM -Glycemic control.  CHF with decompensation and  ischemic cardiomyopathy. -Optimize diuresis  Diabetic foot ulcer -Wound care  AKI on CKD. -Avoid nephrotoxins, monitor renal panel and urine out put.  PAD s/p Lt. BKA -Supportive care  Full code  DVT & GI prophylaxis. Continue with supportive care  Critical care time 40 min

## 2017-10-17 NOTE — Progress Notes (Signed)
Dr. Celesta Aver notified of 2.4 critical lactic acid. New orders received for continuous 272ml NS at 11ml/hr and lactic acid at 0800

## 2017-10-17 NOTE — Progress Notes (Signed)
ANTICOAGULATION CONSULT NOTE - Follow Up Consult  Pharmacy Consult for heparin gtt Indication: chest pain/ACS  Allergies  Allergen Reactions  . Contrast Media [Iodinated Diagnostic Agents] Shortness Of Breath    SOB  . Iohexol Shortness Of Breath     Desc: Respiratory Distress, Laryngedema, diaphoresis, Onset Date: [id number]   . Metrizamide Shortness Of Breath    SOB SOB SOB   . Latex Rash    Patient Measurements: Height: 5\' 7"  (170.2 cm) Weight: 176 lb 12.9 oz (80.2 kg) IBW/kg (Calculated) : 66.1 Heparin Dosing Weight: 80.2kg  Vital Signs: Temp: 98.2 F (36.8 C) (05/27 0800) Temp Source: Oral (05/27 0800) BP: 113/76 (05/27 0600) Pulse Rate: 69 (05/27 1100)  Labs: Recent Labs    10/15/17 0008  10/15/17 1309 10/15/17 1541 10/15/17 2239 10/16/17 0523  10/16/17 1422 10/16/17 1701 10/16/17 2013 10/17/17 0245 10/17/17 0507 10/17/17 1107  HGB 17.5  --   --   --   --  14.5  --   --   --   --  15.9 14.1  --   HCT 53.0*  --   --   --   --  44.8  --   --   --   --  47.6 42.0  --   PLT 226  --   --   --   --  181  --   --   --   --  167 151  --   APTT  --   --   --  27 48*  --   --   --   --   --   --   --   --   LABPROT 14.4  --   --  14.5  --   --   --   --   --   --   --   --   --   INR 1.13  --   --  1.14  --   --   --   --   --   --   --   --   --   HEPARINUNFRC  --   --   --   --  0.56  --    < >  --  0.92*  --  0.43  --  0.41  CREATININE 1.29*  --  1.11  --   --  1.37*  --   --   --   --  1.20  --   --   TROPONINI 0.11*   < >  --   --   --  10.68*  --  7.08*  --  7.08* 5.41*  --   --    < > = values in this interval not displayed.    Estimated Creatinine Clearance: 49.8 mL/min (by C-G formula based on SCr of 1.2 mg/dL).   Assessment: 83 year old male requiring heparin gtt for NSTEMI and AFib. Possible plan for cath 5/28.    Goal of Therapy:  Heparin level 0.3-0.7 units/ml aPTT 66-102 seconds Monitor platelets by anticoagulation protocol: Yes   Plan:   05/27 @ 0300 HL 0.43 therapeutic. Will continue @ 1000 units/hr and will recheck next HL @ 1100. Will f/u CBC w/ am labs.  05/27 @ 1107 HL 0.41 therapeutic x2. Will continue heparin drip at current rate. Check next HL and CBC in AM.   Rayna Sexton, PharmD, BCPS Clinical Pharmacist 10/17/2017 12:39 PM

## 2017-10-17 NOTE — Progress Notes (Signed)
[facility] Cardiology  SUBJECTIVE: Patient laying in bed, denies chest pain or shortness of breath   Vitals:   10/17/17 0540 10/17/17 0550 10/17/17 0600 10/17/17 0630  BP:   113/76   Pulse: 93 (!) 31 80 76  Resp: 20 (!) 25 20 (!) 24  Temp:      TempSrc:      SpO2: 97% 96% 95% 98%  Weight:      Height:         Intake/Output Summary (Last 24 hours) at 10/17/2017 0908 Last data filed at 10/17/2017 0600 Gross per 24 hour  Intake 429.68 ml  Output 950 ml  Net -520.32 ml      PHYSICAL EXAM  General: Well developed, well nourished, in no acute distress HEENT:  Normocephalic and atramatic Neck:  No JVD.  Lungs: Clear bilaterally to auscultation and percussion. Heart: HRRR . Normal S1 and S2 without gallops or murmurs.  Abdomen: Bowel sounds are positive, abdomen soft and non-tender  Msk:  Back normal, normal gait. Normal strength and tone for age. Extremities: No clubbing, cyanosis or edema.   Neuro: Alert and oriented X 3. Psych:  Good affect, responds appropriately   LABS: Basic Metabolic Panel: Recent Labs    10/15/17 1309 10/16/17 0523 10/17/17 0245  NA 134* 138 138  K 4.2 4.3 3.6  CL 103 106 105  CO2 22 23 21*  GLUCOSE 331* 206* 122*  BUN 30* 34* 35*  CREATININE 1.11 1.37* 1.20  CALCIUM 8.4* 8.4* 8.4*  MG 1.8  --  1.9  PHOS 3.9  --  3.2   Liver Function Tests: Recent Labs    10/15/17 0008 10/17/17 0245  AST 33  --   ALT 26  --   ALKPHOS 195*  --   BILITOT 1.1  --   PROT 8.2*  --   ALBUMIN 3.8 3.1*   No results for input(s): LIPASE, AMYLASE in the last 72 hours. CBC: Recent Labs    10/16/17 0523 10/17/17 0245 10/17/17 0507  WBC 13.5* 11.0* 9.8  NEUTROABS 11.5* 8.5*  --   HGB 14.5 15.9 14.1  HCT 44.8 47.6 42.0  MCV 84.0 83.0 83.0  PLT 181 167 151   Cardiac Enzymes: Recent Labs    10/16/17 1422 10/16/17 2013 10/17/17 0245  TROPONINI 7.08* 7.08* 5.41*   BNP: Invalid input(s): POCBNP D-Dimer: No results for input(s): DDIMER in the last 72  hours. Hemoglobin A1C: No results for input(s): HGBA1C in the last 72 hours. Fasting Lipid Panel: Recent Labs    10/16/17 1422  CHOL 108  HDL 37*  LDLCALC 62  TRIG 47  CHOLHDL 2.9   Thyroid Function Tests: No results for input(s): TSH, T4TOTAL, T3FREE, THYROIDAB in the last 72 hours.  Invalid input(s): FREET3 Anemia Panel: No results for input(s): VITAMINB12, FOLATE, FERRITIN, TIBC, IRON, RETICCTPCT in the last 72 hours.  Dg Chest Port 1 View  Result Date: 10/17/2017 CLINICAL DATA:  Congestive heart failure.  Atrial fibrillation. EXAM: PORTABLE CHEST 1 VIEW COMPARISON:  10/15/2017 FINDINGS: Midline trachea. Cardiomegaly accentuated by AP portable technique. Atherosclerosis in the transverse aorta. No pleural effusion or pneumothorax. Improvement in interstitial edema with mild pulmonary venous congestion remaining. IMPRESSION: Cardiomegaly with improved interstitial edema/venous congestion. Aortic Atherosclerosis (ICD10-I70.0). Electronically Signed   By: Abigail Miyamoto M.D.   On: 10/17/2017 08:33   Dg Chest Port 1 View  Result Date: 10/15/2017 CLINICAL DATA:  Respiratory failure EXAM: PORTABLE CHEST 1 VIEW COMPARISON:  10/15/2017 FINDINGS: The heart is mildly  enlarged. Vascular congestion. Kerley B-lines towards the lung bases are noted bilaterally. Interstitial opacities are present throughout both central and basilar lung zones. No pneumothorax. IMPRESSION: CHF with interstitial edema Electronically Signed   By: Marybelle Killings M.D.   On: 10/15/2017 14:13     Echo LVEF 20-25% with anterior, apical and septal akinesis  TELEMETRY: Atrial fibrillation 110 bpm:  ASSESSMENT AND PLAN:  Active Problems:   Atrial fibrillation with rapid ventricular response ([place])    1.  Non-ST elevation myocardial infarction, in the absence of chest pain or diagnostic ECG changes, in the setting of respiratory failure, atrial fibrillation with rapid ventricular rate and marked hyperglycemia.  2D  echocardiogram revealed ischemic cardiomyopathy, with LVEF of 2025%, with anterior apical and septal wall akinesis 2.  Atrial fibrillation with rapid ventricular rate, on Cardizem CD, digoxin and metoprolol, currently on heparin for stroke prevention 3.  Respiratory failure, stabilized, currently on O2 by nasal cannula  Recommendations  1.  Agree with current therapy 2.  Continue heparin drip for now 3.  Stable with cardiac catheterization with selective coronary arteriography on 10/18/2017.  The risk, benefits and alternatives were explained to the patient and informed written consent was obtained.   Isaias Cowman, MD, PhD, [facility] 10/17/2017 9:08 AM

## 2017-10-17 NOTE — Progress Notes (Signed)
ANTICOAGULATION CONSULT NOTE - Follow Up Consult  Pharmacy Consult for heparin gtt Indication: chest pain/ACS  Allergies  Allergen Reactions  . Contrast Media [Iodinated Diagnostic Agents] Shortness Of Breath    SOB  . Iohexol Shortness Of Breath     Desc: Respiratory Distress, Laryngedema, diaphoresis, Onset Date: [id number]   . Metrizamide Shortness Of Breath    SOB SOB SOB   . Latex Rash    Patient Measurements: Height: 5\' 7"  (170.2 cm) Weight: 176 lb 12.9 oz (80.2 kg) IBW/kg (Calculated) : 66.1 Heparin Dosing Weight: 80.2kg  Vital Signs: Temp: 98.4 F (36.9 C) (05/27 0230) Temp Source: Oral (05/27 0230) BP: 117/73 (05/27 0230) Pulse Rate: 44 (05/27 0230)  Labs: Recent Labs    10/15/17 0008  10/15/17 1309 10/15/17 1541  10/15/17 2239 10/16/17 0523 10/16/17 0806 10/16/17 1422 10/16/17 1701 10/16/17 2013 10/17/17 0245  HGB 17.5  --   --   --   --   --  14.5  --   --   --   --   --   HCT 53.0*  --   --   --   --   --  44.8  --   --   --   --   --   PLT 226  --   --   --   --   --  181  --   --   --   --   --   APTT  --   --   --  27  --  48*  --   --   --   --   --   --   LABPROT 14.4  --   --  14.5  --   --   --   --   --   --   --   --   INR 1.13  --   --  1.14  --   --   --   --   --   --   --   --   HEPARINUNFRC  --   --   --   --    < > 0.56  --  0.19*  --  0.92*  --  0.43  CREATININE 1.29*  --  1.11  --   --   --  1.37*  --   --   --   --   --   TROPONINI 0.11*   < >  --   --   --   --  10.68*  --  7.08*  --  7.08*  --    < > = values in this interval not displayed.    Estimated Creatinine Clearance: 43.6 mL/min (A) (by C-G formula based on SCr of 1.37 mg/dL (H)).   Medical History: Past Medical History:  Diagnosis Date  . Arthritis   . Atrial fibrillation ([place])   . Carcinoma of prostate ([place])   . CHF (congestive heart failure) ([place])   . Chronic kidney disease   . Diabetes mellitus without complication ([place])   . ED (erectile dysfunction)   .  Frequent urination   . Hyperlipidemia   . Hypertension   . Moderate mitral insufficiency   . Peripheral vascular disease ([place])   . Pneumonia 04/2016  . Prostate cancer ([place])   . Sleep apnea    OSA--USE C-PAP  . Ulcer of left foot due to type 2 diabetes mellitus ([place])   . Urinary stress incontinence, male  Medications:  Facility-Administered Medications Prior to Admission  Medication Dose Route Frequency Provider Last Rate Last Dose  . docusate sodium (COLACE) capsule 100 mg  100 mg Oral BID Stegmayer, Kimberly A, PA-C      . heparin injection 5,000 Units  5,000 Units Subcutaneous Q8H Stegmayer, Kimberly A, PA-C      . piperacillin-tazobactam (ZOSYN) 3.375 g in dextrose 5 % 50 mL IVPB  3.375 g Intravenous Q6H Stegmayer, Kimberly A, PA-C      . senna (SENOKOT) tablet 8.6 mg  1 tablet Oral Daily PRN Stegmayer, Kimberly A, PA-C       Medications Prior to Admission  Medication Sig Dispense Refill Last Dose  . aspirin EC 81 MG tablet Take 81 mg by mouth daily.   Unknown at Unknown  . diltiazem (CARDIZEM CD) 240 MG 24 hr capsule Take 300 mg by mouth daily.    Unknown at Unknown  . docusate sodium (COLACE) 100 MG capsule Take 100 mg by mouth 2 (two) times daily.   Unknown at Unknown  . famotidine (PEPCID) 20 MG tablet Take 20 mg by mouth 2 (two) times daily.   Unknown at Unknown  . insulin aspart (NOVOLOG) 100 UNIT/ML injection Inject 0-5 Units into the skin 3 (three) times daily with meals.    Unknown at Unknown  . insulin glargine (LANTUS) 100 UNIT/ML injection Inject 0.1 mLs (10 Units total) into the skin at bedtime. 10 mL 11 Unknown at Unknown  . JARDIANCE 10 MG TABS tablet Take 10 mg by mouth daily.    Unknown at Unknown  . lamoTRIgine (LAMICTAL) 25 MG tablet Take 100 mg by mouth 2 (two) times daily.    Unknown at Unknown  . meloxicam (MOBIC) 7.5 MG tablet Take 7.5 mg by mouth daily.   Unknown at Unknown  . metFORMIN (GLUCOPHAGE) 1000 MG tablet TAKE 1 TABLET TWICE A DAY WITH MEALS    Unknown at Unknown  . naproxen sodium (ALEVE) 220 MG tablet Take 220 mg by mouth 2 (two) times daily as needed.   prn at prn  . oxyCODONE-acetaminophen (PERCOCET/ROXICET) 5-325 MG tablet Take 1 tablet by mouth every 6 (six) hours as needed for severe pain.   prn at prn  . simethicone (MYLICON) 80 MG chewable tablet Chew 80 mg by mouth every 6 (six) hours as needed for flatulence.   prn at prn  . atorvastatin (LIPITOR) 80 MG tablet Take 80 mg by mouth daily at 6 PM.    Not Taking at Unknown time  . digoxin (LANOXIN) 0.125 MG tablet Take 1 tablet (0.125 mg total) by mouth daily. (Patient not taking: Reported on 07/22/2017)   Not Taking  . furosemide (LASIX) 20 MG tablet Take 1 tablet (20 mg total) by mouth daily. (Patient not taking: Reported on 10/15/2017) 30 tablet 0 Not Taking  . lubiprostone (AMITIZA) 24 MCG capsule Take by mouth.   Not Taking at Unknown time  . mirabegron ER (MYRBETRIQ) 25 MG TB24 tablet Take 1 tablet (25 mg total) by mouth daily. (Patient not taking: Reported on 10/15/2017) 30 tablet 0 Not Taking  . oxyCODONE (OXY IR/ROXICODONE) 5 MG immediate release tablet Take 1 tablet (5 mg total) by mouth every 6 (six) hours as needed for moderate pain. (Patient not taking: Reported on 07/22/2017) 30 tablet 0 Not Taking  . oxyCODONE (OXYCONTIN) 10 mg 12 hr tablet Take 1 tablet (10 mg total) by mouth every 12 (twelve) hours. (Patient not taking: Reported on 07/22/2017) 14 tablet 0 Not Taking  .  potassium chloride SA (K-DUR,KLOR-CON) 20 MEQ tablet Take by mouth.   Not Taking at Unknown time  . Rivaroxaban (XARELTO) 15 MG TABS tablet Take by mouth.   Not Taking at Unknown time  . silver sulfADIAZINE (SILVADENE) 1 % cream Apply topically daily. (Patient not taking: Reported on 10/15/2017) 50 g 0 Not Taking at Unknown time  . TOPROL XL 100 MG 24 hr tablet Take 100 mg by mouth daily.   Not Taking at Unknown time  . TRULICITY 5.63 JS/9.7WY SOPN 0.75 mg once a week.   Not Taking at Unknown time   Scheduled:   . aspirin  81 mg Oral Daily  . atorvastatin  80 mg Oral q1800  . collagenase   Topical Daily  . digoxin  0.125 mg Oral Daily  . diltiazem  300 mg Oral Daily  . docusate sodium  100 mg Oral Daily  . famotidine  20 mg Oral BID  . furosemide  40 mg Intravenous Daily  . insulin aspart  0-15 Units Subcutaneous Q4H  . insulin glargine  15 Units Subcutaneous QHS  . lamoTRIgine  100 mg Oral BID  . mouth rinse  15 mL Mouth Rinse BID  . metoprolol succinate  100 mg Oral Daily   Infusions:  . dilTIAZem HCl-Dextrose 5 mg/hr (10/16/17 1900)  . heparin 1,000 Units/hr (10/16/17 1940)   PRN: acetaminophen, bisacodyl, dextrose, metoprolol tartrate, ondansetron (ZOFRAN) IV, senna-docusate Anti-infectives (From admission, onward)   None      Assessment: 83 year old male requiring heparin gtt, previously on xarelto 20mg  po q1800 for afib.  Goal of Therapy:  Heparin level 0.3-0.7 units/ml aPTT 66-102 seconds Monitor platelets by anticoagulation protocol: Yes   Plan:  05/27 @ 0300 HL 0.43 therapeutic. Will continue @ 1000 units/hr and will recheck next HL @ 1100. Will f/u CBC w/ am labs.  Tobie Lords, PharmD, BCPS Clinical Pharmacist 10/17/2017

## 2017-10-17 NOTE — Progress Notes (Signed)
[facility] [place] NAME: [name]    MR#:  [id number]  DATE OF BIRTH:  April 09, 1935  SUBJECTIVE:  CHIEF COMPLAINT:   Chief Complaint  Patient presents with  . Tachycardia  . Hyperglycemia    Came with hypoglycemia, atrial fibrillation and shortness of breath   Again on bipap due to respi distress, troponin rise and now coming down.  much improved, off BiPAP and comfortable now.  REVIEW OF SYSTEMS:  CONSTITUTIONAL: No fever, fatigue or weakness.  EYES: No blurred or double vision.  EARS, NOSE, AND THROAT: No tinnitus or ear pain.  RESPIRATORY: No cough, have shortness of breath,no wheezing or hemoptysis.  CARDIOVASCULAR: No chest pain, orthopnea,have edema.  GASTROINTESTINAL: No nausea, vomiting, diarrhea or abdominal pain.  GENITOURINARY: No dysuria, hematuria.  ENDOCRINE: No polyuria, nocturia,  HEMATOLOGY: No anemia, easy bruising or bleeding SKIN: No rash or lesion. MUSCULOSKELETAL: No joint pain or arthritis.   NEUROLOGIC: No tingling, numbness, weakness.  PSYCHIATRY: No anxiety or depression.   ROS  DRUG ALLERGIES:   Allergies  Allergen Reactions  . Contrast Media [Iodinated Diagnostic Agents] Shortness Of Breath    SOB  . Iohexol Shortness Of Breath     Desc: Respiratory Distress, Laryngedema, diaphoresis, Onset Date: [id number]   . Metrizamide Shortness Of Breath    SOB SOB SOB   . Latex Rash    VITALS:  Blood pressure 113/76, pulse 71, temperature 97.9 F (36.6 C), temperature source Oral, resp. rate (!) 24, height 5\' 7"  (1.702 m), weight 80.2 kg (176 lb 12.9 oz), SpO2 96 %.  PHYSICAL EXAMINATION:  GENERAL:  83 y.o.-year-old patient lying in the bed with  acute respiratory distress.  EYES: Pupils equal, round, reactive to light and accommodation. No scleral icterus. Extraocular muscles intact.  HEENT: Head atraumatic, normocephalic. Oropharynx and nasopharynx clear.  NECK:  Supple, no jugular venous  distention. No thyroid enlargement, no tenderness.  LUNGS: Normal breath sounds bilaterally, no wheezing,bilateral crepitation. No use of accessory muscles of respiration. Off bipap. CARDIOVASCULAR: S1, S2 normal. No murmurs, rubs, or gallops.  ABDOMEN: Soft, nontender, nondistended. Bowel sounds present. No organomegaly or mass.  EXTREMITIES: No pedal edema, cyanosis, or clubbing. Left-sided below knee amputation.   Right side at the shin of TBI and there is an injury to the skin with surrounding redness. NEUROLOGIC: Cranial nerves II through XII are intact. Muscle strength 3-4/5 in all extremities. Sensation intact. Gait not checked.  PSYCHIATRIC: The patient is alert and oriented x 3.  SKIN: No obvious rash, lesion, or ulcer.   Physical Exam LABORATORY PANEL:   CBC Recent Labs  Lab 10/17/17 0507  WBC 9.8  HGB 14.1  HCT 42.0  PLT 151   ------------------------------------------------------------------------------------------------------------------  Chemistries  Recent Labs  Lab 10/15/17 0008  10/17/17 0245  NA 134*   < > 138  K 4.7   < > 3.6  CL 99*   < > 105  CO2 19*   < > 21*  GLUCOSE 686*   < > 122*  BUN 28*   < > 35*  CREATININE 1.29*   < > 1.20  CALCIUM 8.9   < > 8.4*  MG 1.9   < > 1.9  AST 33  --   --   ALT 26  --   --   ALKPHOS 195*  --   --   BILITOT 1.1  --   --    < > = values in this interval  not displayed.   ------------------------------------------------------------------------------------------------------------------  Cardiac Enzymes Recent Labs  Lab 10/16/17 2013 10/17/17 0245  TROPONINI 7.08* 5.41*   ------------------------------------------------------------------------------------------------------------------  RADIOLOGY:  Dg Chest Port 1 View  Result Date: 10/17/2017 CLINICAL DATA:  Congestive heart failure.  Atrial fibrillation. EXAM: PORTABLE CHEST 1 VIEW COMPARISON:  10/15/2017 FINDINGS: Midline trachea. Cardiomegaly accentuated by  AP portable technique. Atherosclerosis in the transverse aorta. No pleural effusion or pneumothorax. Improvement in interstitial edema with mild pulmonary venous congestion remaining. IMPRESSION: Cardiomegaly with improved interstitial edema/venous congestion. Aortic Atherosclerosis (ICD10-I70.0). Electronically Signed   By: Abigail Miyamoto M.D.   On: 10/17/2017 08:33   Dg Chest Port 1 View  Result Date: 10/15/2017 CLINICAL DATA:  Respiratory failure EXAM: PORTABLE CHEST 1 VIEW COMPARISON:  10/15/2017 FINDINGS: The heart is mildly enlarged. Vascular congestion. Ker[place] B-lines towards the lung bases are noted bilaterally. Interstitial opacities are present throughout both central and basilar lung zones. No pneumothorax. IMPRESSION: CHF with interstitial edema Electronically Signed   By: Marybelle Killings M.D.   On: 10/15/2017 14:13    ASSESSMENT AND PLAN:   Active Problems:   Atrial fibrillation with rapid ventricular response (HCC)  * non-ST elevation MI   Significant rise in his troponin, currently heparin IV drip and further management per cardiology.   On ASA, betablocker, atorvastatin.  plan for cath tomorrow.  * hyperglycemia- nonketotic   Responded to IV insulin drip, switch to long-acting insulin with sliding scale control.   Again on insuline drip due to Bipap use.  stable now.  * atrial fibrillation   Likely secondary to stress of hyperglycemia and dehydration.   Currently on digoxin, Cardizem and metoprolol oral.   Heparin IV drip.  * acute systolic congestive heart failure   This is secondary to atrial fibrillation.   IV Lasix and BiPAP as needed.   Significant drop in EF, compared to past.   All the records are reviewed and case discussed with Care Management/Social Workerr. Management plans discussed with the patient, family and they are in agreement.  CODE STATUS: Full.  TOTAL TIME TAKING CARE OF THIS PATIENT: 35 minutes.    POSSIBLE D/C IN 1-2 DAYS, DEPENDING ON  CLINICAL CONDITION.   Vaughan Basta M.D on 10/17/2017   Between 7am to 6pm - Pager - [phone]  After 6pm go to [email or web address] - password [email or web address] [facility]  Office  [phone]  CC: Primary care physician; Alanson Aly, FNP  Note: This dictation was prepared with Dragon dictation along with smaller phrase technology. Any transcriptional errors that result from this process are unintentional.

## 2017-10-18 ENCOUNTER — Inpatient Hospital Stay: Payer: Medicare Other

## 2017-10-18 ENCOUNTER — Encounter
Admission: EM | Disposition: A | Discharge status: Skilled Nursing Facility | Payer: Self-pay | Source: Home / Self Care | Attending: Internal Medicine

## 2017-10-18 DIAGNOSIS — I70238 Atherosclerosis of native arteries of right leg with ulceration of other part of lower right leg: Secondary | ICD-10-CM

## 2017-10-18 DIAGNOSIS — M79605 Pain in left leg: Secondary | ICD-10-CM

## 2017-10-18 DIAGNOSIS — I4891 Unspecified atrial fibrillation: Secondary | ICD-10-CM

## 2017-10-18 DIAGNOSIS — M79604 Pain in right leg: Secondary | ICD-10-CM

## 2017-10-18 LAB — CBC WITH DIFFERENTIAL/PLATELET
BASOS ABS: 0.1 10*3/uL (ref 0–0.1)
BASOS PCT: 1 %
EOS ABS: 0.3 10*3/uL (ref 0–0.7)
Eosinophils Relative: 3 %
HCT: 41.7 % (ref 40.0–52.0)
HEMOGLOBIN: 14.1 g/dL (ref 13.0–18.0)
Lymphocytes Relative: 15 %
Lymphs Abs: 1.5 10*3/uL (ref 1.0–3.6)
MCH: 27.9 pg (ref 26.0–34.0)
MCHC: 34 g/dL (ref 32.0–36.0)
MCV: 82.3 fL (ref 80.0–100.0)
Monocytes Absolute: 0.6 10*3/uL (ref 0.2–1.0)
Monocytes Relative: 7 %
NEUTROS PCT: 74 %
Neutro Abs: 7 10*3/uL — ABNORMAL HIGH (ref 1.4–6.5)
Platelets: 178 10*3/uL (ref 150–440)
RBC: 5.06 MIL/uL (ref 4.40–5.90)
RDW: 18.3 % — ABNORMAL HIGH (ref 11.5–14.5)
WBC: 9.5 10*3/uL (ref 3.8–10.6)

## 2017-10-18 LAB — GLUCOSE, CAPILLARY
GLUCOSE-CAPILLARY: 151 mg/dL — AB (ref 65–99)
GLUCOSE-CAPILLARY: 98 mg/dL (ref 65–99)
Glucose-Capillary: 99 mg/dL (ref 65–99)
Glucose-Capillary: 205 mg/dL — ABNORMAL HIGH (ref 65–99)
Glucose-Capillary: 296 mg/dL — ABNORMAL HIGH (ref 65–99)
Glucose-Capillary: 239 mg/dL — ABNORMAL HIGH (ref 65–99)

## 2017-10-18 LAB — HEPARIN LEVEL (UNFRACTIONATED): HEPARIN UNFRACTIONATED: 0.34 [IU]/mL (ref 0.30–0.70)

## 2017-10-18 LAB — DIGOXIN LEVEL: DIGOXIN LVL: 0.8 ng/mL (ref 0.8–2.0)

## 2017-10-18 SURGERY — LEFT HEART CATH
Anesthesia: Moderate Sedation | Laterality: Left

## 2017-10-18 MED ORDER — RIVAROXABAN 20 MG PO TABS
20.0000 mg | ORAL_TABLET | Freq: Every day | ORAL | Status: DC
  Administered 2017-10-18 – 2017-10-27 (×9): 20 mg via ORAL
  Filled 2017-10-18 (×9): qty 1

## 2017-10-18 MED ORDER — FAMOTIDINE 20 MG PO TABS
40.0000 mg | ORAL_TABLET | Freq: Once | ORAL | Status: AC
  Administered 2017-10-18: 40 mg via ORAL

## 2017-10-18 MED ORDER — METHYLPREDNISOLONE SODIUM SUCC 125 MG IJ SOLR
INTRAMUSCULAR | Status: AC
  Filled 2017-10-18: qty 2

## 2017-10-18 MED ORDER — QUETIAPINE FUMARATE 25 MG PO TABS
25.0000 mg | ORAL_TABLET | Freq: Once | ORAL | Status: AC
  Administered 2017-10-18: 25 mg via ORAL
  Filled 2017-10-18: qty 1

## 2017-10-18 MED ORDER — FAMOTIDINE 20 MG PO TABS
ORAL_TABLET | ORAL | Status: AC
  Filled 2017-10-18: qty 2

## 2017-10-18 MED ORDER — DIPHENHYDRAMINE HCL 50 MG/ML IJ SOLN
INTRAMUSCULAR | Status: AC
  Filled 2017-10-18: qty 1

## 2017-10-18 MED ORDER — DIPHENHYDRAMINE HCL 50 MG/ML IJ SOLN
25.0000 mg | Freq: Once | INTRAMUSCULAR | Status: AC
  Administered 2017-10-18: 50 mg via INTRAVENOUS

## 2017-10-18 MED ORDER — METHYLPREDNISOLONE SODIUM SUCC 125 MG IJ SOLR
125.0000 mg | Freq: Once | INTRAMUSCULAR | Status: AC
  Administered 2017-10-18: 125 mg via INTRAVENOUS

## 2017-10-18 MED ORDER — FUROSEMIDE 10 MG/ML IJ SOLN
20.0000 mg | Freq: Four times a day (QID) | INTRAMUSCULAR | Status: AC
  Administered 2017-10-18 (×2): 20 mg via INTRAVENOUS
  Filled 2017-10-18 (×2): qty 2

## 2017-10-18 MED ORDER — LIDOCAINE HCL (PF) 1 % IJ SOLN
INTRAMUSCULAR | Status: AC
  Filled 2017-10-18: qty 30

## 2017-10-18 MED ORDER — DILTIAZEM HCL 25 MG/5ML IV SOLN: 15.0000 mg | Freq: Once | INTRAVENOUS | Status: DC

## 2017-10-18 MED ORDER — RIVAROXABAN 15 MG PO TABS: 15.0000 mg | ORAL_TABLET | Freq: Every day | ORAL | Status: DC

## 2017-10-18 MED ORDER — DILTIAZEM LOAD VIA INFUSION
15.0000 mg | Freq: Once | INTRAVENOUS | Status: AC
  Administered 2017-10-18: 15 mg via INTRAVENOUS
  Filled 2017-10-18: qty 15

## 2017-10-18 NOTE — Progress Notes (Signed)
Patient was to have appt for wound on right lower leg today at 1345 w/ Dr. Elvina Mattes. I spoke with him via telephone. He gave me verbal orders to place a consult to vascular surgery evaluation. Dr. Anselm Jungling gave verbal orders at the bedside to place a consult to wound care as well. Both orders have been placed.

## 2017-10-18 NOTE — Progress Notes (Addendum)
ANTICOAGULATION CONSULT NOTE - Follow Up Consult  Pharmacy Consult for heparin gtt Indication: chest pain/ACS  Allergies  Allergen Reactions  . Contrast Media [Iodinated Diagnostic Agents] Shortness Of Breath    SOB  . Iohexol Shortness Of Breath     Desc: Respiratory Distress, Laryngedema, diaphoresis, Onset Date: [id number]   . Metrizamide Shortness Of Breath    SOB SOB SOB   . Latex Rash    Patient Measurements: Height: 5\' 7"  (170.2 cm) Weight: 181 lb 7 oz (82.3 kg) IBW/kg (Calculated) : 66.1 Heparin Dosing Weight: 80.2kg  Vital Signs: Temp: 98.2 F (36.8 C) (05/28 0400) BP: 120/108 (05/28 0600) Pulse Rate: 84 (05/28 0600)  Labs: Recent Labs    10/15/17 1309 10/15/17 1541 10/15/17 2239  10/16/17 0523  10/16/17 1422  10/16/17 2013 10/17/17 0245 10/17/17 0507 10/17/17 1107 10/18/17 0632  HGB  --   --   --    < > 14.5  --   --   --   --  15.9 14.1  --  14.1  HCT  --   --   --    < > 44.8  --   --   --   --  47.6 42.0  --  41.7  PLT  --   --   --    < > 181  --   --   --   --  167 151  --  178  APTT  --  27 48*  --   --   --   --   --   --   --   --   --   --   LABPROT  --  14.5  --   --   --   --   --   --   --   --   --   --   --   INR  --  1.14  --   --   --   --   --   --   --   --   --   --   --   HEPARINUNFRC  --   --  0.56  --   --    < >  --    < >  --  0.43  --  0.41 0.34  CREATININE 1.11  --   --   --  1.37*  --   --   --   --  1.20  --   --   --   TROPONINI  --   --   --   --  10.68*  --  7.08*  --  7.08* 5.41*  --   --   --    < > = values in this interval not displayed.    Estimated Creatinine Clearance: 50.4 mL/min (by C-G formula based on SCr of 1.2 mg/dL).   Assessment: 83 year old male requiring heparin gtt for NSTEMI and AFib. Possible plan for cath 5/28.    Goal of Therapy:  Heparin level 0.3-0.7 units/ml aPTT 66-102 seconds Monitor platelets by anticoagulation protocol: Yes   Plan:  05/27 @ 0300 HL 0.43 therapeutic. Will continue  @ 1000 units/hr and will recheck next HL @ 1100. Will f/u CBC w/ am labs.  05/27 @ 1107 HL 0.41 therapeutic x2. Will continue heparin drip at current rate. Check next HL and CBC in AM.   5/28 AM heparin level 0.34. Continue current regimen. Recheck heparin level and CBC with tomorrow  AM labs.  Sim Boast, PharmD, BCPS  10/18/17 7:00 AM

## 2017-10-18 NOTE — Consult Note (Signed)
[facility]                                                      Patient Demographics  [name], is a 83 y.o. male   MRN: [id number]   DOB - 1935-05-04  Admit Date - 10/14/2017    Outpatient Primary MD for the patient is Alanson Aly, [place]   Reason for consult right ankle wound   With History of -  Past Medical History:  Diagnosis Date  . Arthritis   . Atrial fibrillation ([place])   . Carcinoma of prostate ([place])   . CHF (congestive heart failure) ([place])   . Chronic kidney disease   . Diabetes mellitus without complication ([place])   . ED (erectile dysfunction)   . Frequent urination   . Hyperlipidemia   . Hypertension   . Moderate mitral insufficiency   . Peripheral vascular disease ([place])   . Pneumonia 04/2016  . Prostate cancer ([place])   . Sleep apnea    OSA--USE C-PAP  . Ulcer of left foot due to type 2 diabetes mellitus ([place])   . Urinary stress incontinence, male       Past Surgical History:  Procedure Laterality Date  . AMPUTATION Left 01/12/2017   Procedure: AMPUTATION BELOW KNEE;  Surgeon: Algernon Huxley, MD;  Location: [facility];  Service: General;  Laterality: Left;  . APLIGRAFT PLACEMENT Left 10/15/2016   Procedure: APLIGRAFT PLACEMENT;  Surgeon: Albertine Patricia, DPM;  Location: [facility];  Service: Podiatry;  Laterality: Left;  necrotic ulcer  . CHOLECYSTECTOMY    . EYE SURGERY Bilateral    Cataract Extraction with IOL  . IRRIGATION AND DEBRIDEMENT FOOT Left 10/15/2016   Procedure: IRRIGATION AND DEBRIDEMENT FOOT-EXCISIONAL DEBRIDEMENT OF SKIN 3RD, 4TH AND 5TH TOES WITH APPLICATION OF APLIGRAFT;  Surgeon: Albertine Patricia, DPM;  Location: [facility];  Service: Podiatry;  Laterality: Left;  necrotic,gangrene  . IRRIGATION AND DEBRIDEMENT FOOT Left 12/09/2016   Procedure: IRRIGATION AND DEBRIDEMENT FOOT-MUSCLE/FASCIA, RESECTION OF  FOURTH AND FIFTH METATARSAL NECROTIC BONE;  Surgeon: Albertine Patricia, DPM;  Location: [facility];  Service: Podiatry;  Laterality: Left;  . LOWER EXTREMITY ANGIOGRAPHY Left 08/16/2016   Procedure: Lower Extremity Angiography;  Surgeon: Algernon Huxley, MD;  Location: [place] [facility];  Service: Cardiovascular;  Laterality: Left;  . LOWER EXTREMITY ANGIOGRAPHY Left 09/01/2016   Procedure: Lower Extremity Angiography;  Surgeon: Algernon Huxley, MD;  Location: [place] [facility];  Service: Cardiovascular;  Laterality: Left;  . LOWER EXTREMITY ANGIOGRAPHY Left 10/14/2016   Procedure: Lower Extremity Angiography;  Surgeon: Algernon Huxley, MD;  Location: [place] [facility];  Service: Cardiovascular;  Laterality: Left;  . LOWER EXTREMITY ANGIOGRAPHY Left 12/20/2016   Procedure: Lower Extremity Angiography;  Surgeon: Algernon Huxley, MD;  Location: [place] [facility];  Service: Cardiovascular;  Laterality: Left;  . LOWER EXTREMITY INTERVENTION  09/01/2016   Procedure: Lower Extremity Intervention;  Surgeon: Algernon Huxley, MD;  Location: [place] [facility];  Service: Cardiovascular;;  . PROSTATE SURGERY     removal  . TONSILLECTOMY     as a child    in for   Chief Complaint  Patient presents with  . Tachycardia  . Hyperglycemia     HPI  [name]  is a 83 y.o. male, patient was seen  in my office last week with beginnings of ulceration on his right lower leg in the right above his ankle.  Had some edema around the area so I put him an Unna boot to take care of the edema he was admitted to the hospital because of STEMI but because he was unstable I could not do a catheterization on today.  He is stable at this point I hope will be to get that heart catheterization tomorrow.  Does have a wound on his leg and they were concerned about it and call me this morning.  At that time instructed him to put a wet-to-dry dressing on that and stated that I would come look at it later.   Social History Social  History   Tobacco Use  . Smoking status: Former Smoker    Packs/day: 0.25    Types: Cigarettes    Last attempt to quit: 10/14/1994    Years since quitting: 23.0  . Smokeless tobacco: Never Used  Substance Use Topics  . Alcohol use: No    Alcohol/week: 0.0 oz    Family History Family History  Problem Relation Age of Onset  . Hypertension Mother   . Diabetes Mother   . Prostate cancer Neg Hx   . Bladder Cancer Neg Hx   . Kidney disease Neg Hx     Prior to Admission medications   Medication Sig Start Date End Date Taking? Authorizing Provider  aspirin EC 81 MG tablet Take 81 mg by mouth daily.   Yes [provider]  diltiazem (CARDIZEM CD) 240 MG 24 hr capsule Take 300 mg by mouth daily.    Yes [provider]  docusate sodium (COLACE) 100 MG capsule Take 100 mg by mouth 2 (two) times daily.   Yes [provider]  famotidine (PEPCID) 20 MG tablet Take 20 mg by mouth 2 (two) times daily.   Yes [provider]  insulin aspart (NOVOLOG) 100 UNIT/ML injection Inject 0-5 Units into the skin 3 (three) times daily with meals.    Yes [provider]  insulin glargine (LANTUS) 100 UNIT/ML injection Inject 0.1 mLs (10 Units total) into the skin at bedtime. 01/19/17  Yes Sainani, Belia Heman, MD  JARDIANCE 10 MG TABS tablet Take 10 mg by mouth daily.  06/28/17  Yes [provider]  lamoTRIgine (LAMICTAL) 25 MG tablet Take 100 mg by mouth 2 (two) times daily.  04/20/17  Yes [provider]  meloxicam (MOBIC) 7.5 MG tablet Take 7.5 mg by mouth daily.   Yes [provider]  metFORMIN (GLUCOPHAGE) 1000 MG tablet TAKE 1 TABLET TWICE A DAY WITH MEALS 11/28/15  Yes [provider]  naproxen sodium (ALEVE) 220 MG tablet Take 220 mg by mouth 2 (two) times daily as needed.   Yes [provider]  oxyCODONE-acetaminophen (PERCOCET/ROXICET) 5-325 MG tablet Take 1 tablet by mouth every 6 (six) hours as needed for severe pain.    Yes [provider]  simethicone (MYLICON) 80 MG chewable tablet Chew 80 mg by mouth every 6 (six) hours as needed for flatulence.   Yes [provider]  atorvastatin (LIPITOR) 80 MG tablet Take 80 mg by mouth daily at 6 PM.     [provider]  digoxin (LANOXIN) 0.125 MG tablet Take 1 tablet (0.125 mg total) by mouth daily. Patient not taking: Reported on 07/22/2017 01/20/17   Henreitta Leber, MD  furosemide (LASIX) 20 MG tablet Take 1 tablet (20 mg total) by mouth  daily. Patient not taking: Reported on 10/15/2017 02/13/17   Schnier, Dolores Lory, MD  lubiprostone (AMITIZA) 24 MCG capsule Take by mouth.    [provider]  mirabegron ER (MYRBETRIQ) 25 MG TB24 tablet Take 1 tablet (25 mg total) by mouth daily. Patient not taking: Reported on 10/15/2017 08/19/17   Zara Council A, PA-C  oxyCODONE (OXY IR/ROXICODONE) 5 MG immediate release tablet Take 1 tablet (5 mg total) by mouth every 6 (six) hours as needed for moderate pain. Patient not taking: Reported on 07/22/2017 01/19/17   Henreitta Leber, MD  oxyCODONE (OXYCONTIN) 10 mg 12 hr tablet Take 1 tablet (10 mg total) by mouth every 12 (twelve) hours. Patient not taking: Reported on 07/22/2017 01/19/17   Henreitta Leber, MD  potassium chloride SA (K-DUR,KLOR-CON) 20 MEQ tablet Take by mouth. 03/29/17   [provider]  Rivaroxaban (XARELTO) 15 MG TABS tablet Take by mouth.    [provider]  silver sulfADIAZINE (SILVADENE) 1 % cream Apply topically daily. Patient not taking: Reported on 10/15/2017 02/12/17   Schnier, Dolores Lory, MD  TOPROL XL 100 MG 24 hr tablet Take 100 mg by mouth daily. 12/25/16   [provider]  TRULICITY 3.26 ZT/2.4PY SOPN 0.75 mg once a week. 11/14/16   [provider]    Anti-infectives (From admission, onward)   None      Scheduled Meds: . aspirin  81 mg Oral Daily  . atorvastatin  80 mg Oral q1800  . collagenase   Topical Daily  . digoxin  0.125 mg Oral  Daily  . diltiazem  300 mg Oral Daily  . diphenhydrAMINE      . docusate sodium  100 mg Oral Daily  . famotidine      . famotidine  20 mg Oral BID  . furosemide  20 mg Intravenous Q6H  . furosemide  40 mg Intravenous Daily  . insulin aspart  0-15 Units Subcutaneous Q4H  . insulin glargine  15 Units Subcutaneous QHS  . lamoTRIgine  100 mg Oral BID  . mouth rinse  15 mL Mouth Rinse BID  . methylPREDNISolone sodium succinate      . metoprolol succinate  100 mg Oral Daily  . rivaroxaban  20 mg Oral Q supper   Continuous Infusions: . dilTIAZem HCl-Dextrose Stopped (10/18/17 1206)   PRN Meds:.acetaminophen, bisacodyl, dextrose, metoprolol tartrate, ondansetron (ZOFRAN) IV, senna-docusate  Allergies  Allergen Reactions  . Contrast Media [Iodinated Diagnostic Agents] Shortness Of Breath    SOB  . Iohexol Shortness Of Breath     Desc: Respiratory Distress, Laryngedema, diaphoresis, Onset Date: [id number]   . Metrizamide Shortness Of Breath    SOB SOB SOB   . Latex Rash    Physical Exam  Vitals  Blood pressure 115/90, pulse 76, temperature 98.4 F (36.9 C), temperature source Oral, resp. rate (!) 24, height 5\' 7"  (1.702 m), weight 82.1 kg (181 lb), SpO2 96 %.  Lower Extremity exam:  Vascular: Nonpalpable pulses to the right lower extremity.  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  Right lower extremity has known vascular disease also.  Dermatological: Patient has a dry eschar on the right lower leg just above the ankle with a triangular in shape approximately 2 segmenters in length and 2 cm in width with dry eschar centrally located some mild erythema around the area but no evidence of infection.  Neurological: Patient has some peripheral neuropathy but this is mostly sensate to  the lower extremity.  Ortho: As noted previous B-K amputation left  Data Review  CBC Recent Labs  Lab 10/15/17 0008 10/16/17 0523 10/17/17 0245 10/17/17 0507  10/18/17 0632  WBC 12.9* 13.5* 11.0* 9.8 9.5  HGB 17.5 14.5 15.9 14.1 14.1  HCT 53.0* 44.8 47.6 42.0 41.7  PLT 226 181 167 151 178  MCV 84.9 84.0 83.0 83.0 82.3  MCH 28.0 27.1 27.8 27.9 27.9  MCHC 32.9 32.3 33.5 33.7 34.0  RDW 18.2* 18.2* 18.6* 18.3* 18.3*  LYMPHSABS 1.1 1.2 1.5  --  1.5  MONOABS 0.7 0.7 0.7  --  0.6  EOSABS 0.1 0.1 0.3  --  0.3  BASOSABS 0.1 0.1 0.1  --  0.1   ------------------------------------------------------------------------------------------------------------------  Chemistries  Recent Labs  Lab 10/15/17 0008 10/15/17 1309 10/16/17 0523 10/17/17 0245  NA 134* 134* 138 138  K 4.7 4.2 4.3 3.6  CL 99* 103 106 105  CO2 19* 22 23 21*  GLUCOSE 686* 331* 206* 122*  BUN 28* 30* 34* 35*  CREATININE 1.29* 1.11 1.37* 1.20  CALCIUM 8.9 8.4* 8.4* 8.4*  MG 1.9 1.8  --  1.9  AST 33  --   --   --   ALT 26  --   --   --   ALKPHOS 195*  --   --   --   BILITOT 1.1  --   --   --    --------------------------------------------------------------------------------------- Imaging results: Assessment & Plan: Right now the patient is needs a wet-to-dry saline dressing to the region.  I do think a vascular consult was called for and evaluation as to whether not they need to consider another catheterization possible angioplasty to the right lower extremity.  Wound itself looks fairly stable and is really no worse than it was last week.  Not appear to be infected at this point.  May have trouble getting it healed without improved vascular flow however.  Active Problems:   Atrial fibrillation with rapid ventricular response ([place])   Family Communication: Plan discussed with patient and family  Albertine Patricia M.D on 10/18/2017 at 5:51 PM  Thank you for the consult, we will follow the patient with you in the Hospital.

## 2017-10-18 NOTE — Progress Notes (Signed)
Patient was in special procedures during 1000 hour medication time. Patient has still refused to take these medications. Will re-attempt to administer within and hour.

## 2017-10-18 NOTE — Progress Notes (Signed)
[place] [facility] [place] NAME: [name]    MR#:  [id number]  DATE OF BIRTH:  30-Apr-1935  SUBJECTIVE:  CHIEF COMPLAINT:   Chief Complaint  Patient presents with  . Tachycardia  . Hyperglycemia    Came with hypoglycemia, atrial fibrillation and shortness of breath   Again on bipap due to respi distress, troponin rise and now coming down.  much improved, off BiPAP and comfortable now. Cardiology decided No need for procedure.  REVIEW OF SYSTEMS:  CONSTITUTIONAL: No fever, fatigue or weakness.  EYES: No blurred or double vision.  EARS, NOSE, AND THROAT: No tinnitus or ear pain.  RESPIRATORY: No cough, have shortness of breath,no wheezing or hemoptysis.  CARDIOVASCULAR: No chest pain, orthopnea,have edema.  GASTROINTESTINAL: No nausea, vomiting, diarrhea or abdominal pain.  GENITOURINARY: No dysuria, hematuria.  ENDOCRINE: No polyuria, nocturia,  HEMATOLOGY: No anemia, easy bruising or bleeding SKIN: No rash or lesion. MUSCULOSKELETAL: No joint pain or arthritis.   NEUROLOGIC: No tingling, numbness, weakness.  PSYCHIATRY: No anxiety or depression.   ROS  DRUG ALLERGIES:   Allergies  Allergen Reactions  . Contrast Media [Iodinated Diagnostic Agents] Shortness Of Breath    SOB  . Iohexol Shortness Of Breath     Desc: Respiratory Distress, Laryngedema, diaphoresis, Onset Date: [id number]   . Metrizamide Shortness Of Breath    SOB SOB SOB   . Latex Rash    VITALS:  Blood pressure 115/90, pulse 76, temperature 98.4 F (36.9 C), temperature source Oral, resp. rate (!) 24, height 5\' 7"  (1.702 m), weight 82.1 kg (181 lb), SpO2 96 %.  PHYSICAL EXAMINATION:  GENERAL:  83 y.o.-year-old patient lying in the bed with  acute respiratory distress.  EYES: Pupils equal, round, reactive to light and accommodation. No scleral icterus. Extraocular muscles intact.  HEENT: Head atraumatic, normocephalic. Oropharynx and nasopharynx clear.   NECK:  Supple, no jugular venous distention. No thyroid enlargement, no tenderness.  LUNGS: Normal breath sounds bilaterally, no wheezing,bilateral crepitation. No use of accessory muscles of respiration. Off bipap. CARDIOVASCULAR: S1, S2 normal. No murmurs, rubs, or gallops.  ABDOMEN: Soft, nontender, nondistended. Bowel sounds present. No organomegaly or mass.  EXTREMITIES: No pedal edema, cyanosis, or clubbing. Left-sided below knee amputation.   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 NEUROLOGIC: Cranial nerves II through XII are intact. Muscle strength 3-4/5 in all extremities. Sensation intact. Gait not checked.  PSYCHIATRIC: The patient is alert and oriented x 3.  SKIN: No obvious rash, lesion, or ulcer.   Physical Exam LABORATORY PANEL:   CBC Recent Labs  Lab 10/18/17 0632  WBC 9.5  HGB 14.1  HCT 41.7  PLT 178   ------------------------------------------------------------------------------------------------------------------  Chemistries  Recent Labs  Lab 10/15/17 0008  10/17/17 0245  NA 134*   < > 138  K 4.7   < > 3.6  CL 99*   < > 105  CO2 19*   < > 21*  GLUCOSE 686*   < > 122*  BUN 28*   < > 35*  CREATININE 1.29*   < > 1.20  CALCIUM 8.9   < > 8.4*  MG 1.9   < > 1.9  AST 33  --   --   ALT 26  --   --   ALKPHOS 195*  --   --   BILITOT 1.1  --   --    < > =  values in this interval not displayed.   ------------------------------------------------------------------------------------------------------------------  Cardiac Enzymes Recent Labs  Lab 10/16/17 2013 10/17/17 0245  TROPONINI 7.08* 5.41*   ------------------------------------------------------------------------------------------------------------------  RADIOLOGY:  Dg Chest Port 1 View  Result Date: 10/18/2017 CLINICAL DATA:  CHF. EXAM: PORTABLE CHEST 1 VIEW COMPARISON:  No prior. FINDINGS: Cardiomegaly with  bilateral pulmonary interstitial prominence suggesting CHF. Small bilateral pleural effusions. No pneumothorax. Degenerative changes thoracic spine and both shoulders. IMPRESSION: Cardiomegaly with increased interstitial prominence suggesting CHF. Small bilateral pleural effusions. Electronically Signed   By: Marcello Moores  Register   On: 10/18/2017 07:03   Dg Chest Port 1 View  Result Date: 10/17/2017 CLINICAL DATA:  Congestive heart failure.  Atrial fibrillation. EXAM: PORTABLE CHEST 1 VIEW COMPARISON:  10/15/2017 FINDINGS: Midline trachea. Cardiomegaly accentuated by AP portable technique. Atherosclerosis in the transverse aorta. No pleural effusion or pneumothorax. Improvement in interstitial edema with mild pulmonary venous congestion remaining. IMPRESSION: Cardiomegaly with improved interstitial edema/venous congestion. Aortic Atherosclerosis (ICD10-I70.0). Electronically Signed   By: Abigail Miyamoto M.D.   On: 10/17/2017 08:33    ASSESSMENT AND PLAN:   Active Problems:   Atrial fibrillation with rapid ventricular response (HCC)  * non-ST elevation MI   Significant rise in his troponin, currently heparin IV drip and further management per cardiology.   On ASA, betablocker, atorvastatin.  plan for cath , but cardiology suggest to manage medically due to his being high risk.  * hyperglycemia- nonketotic   Responded to IV insulin drip, switch to long-acting insulin with sliding scale control.   Again on insuline drip due to Bipap use.  stable now. On nasal canula oxygen.  * atrial fibrillation   Likely secondary to stress of hyperglycemia and dehydration.   Currently on digoxin, Cardizem and metoprolol oral.   Heparin IV drip.  * acute systolic congestive heart failure   This is secondary to atrial fibrillation.   IV Lasix and BiPAP as needed.   Significant drop in EF, compared to past.  * right leg wound   Wound care team.   All the records are reviewed and case discussed with Care  Management/Social Workerr. Management plans discussed with the patient, family and they are in agreement.  CODE STATUS: Full.  TOTAL TIME TAKING CARE OF THIS PATIENT: 35 minutes.    POSSIBLE D/C IN 1-2 DAYS, DEPENDING ON CLINICAL CONDITION.   Vaughan Basta M.D on 10/18/2017   Between 7am to 6pm - Pager - [phone]  After 6pm go to [email or web address] - password [email or web address] [facility]  Office  [phone]  CC: Primary care physician; Alanson Aly, FNP  Note: This dictation was prepared with Dragon dictation along with smaller phrase technology. Any transcriptional errors that result from this process are unintentional.

## 2017-10-18 NOTE — Consult Note (Addendum)
[facility] Vascular Consult Note  MRN : [id number]  [name] is a 83 y.o. (Feb 25, 1935) male who presents with chief complaint of  Chief Complaint  Patient presents with  . Tachycardia  . Hyperglycemia  .  History of Present Illness:  I am asked to evaluate the patient by Dr. Elvina Mattes for bilateral leg pain  The patient is seen for evaluation of painful lower extremities and diminished pulses associated with ulceration of theright ankle.  The patient notes the ulcer has been present for multiple weeks and has not been improving.  It is very painful and has had some drainage.  No specific history of trauma noted by the patient.  The patient denies fever or chills.  the patient does have diabetes which has been difficult to control.  Patient notes prior to the ulcer developing the extremities were painful particularly with ambulation or activity and the discomfort is very consistent day today. He is followed in the office by Dr Lucky Cowboy and was last seen in early April.  At that time he did not have ulcers and his ABI's were stable.  The patient denies rest pain or dangling of an extremity off the side of the bed during the night for relief. No prior interventions or surgeries.  No history of back problems or DJD of the lumbar sacral spine.   The patient denies amaurosis fugax or recent TIA symptoms. There are no recent neurological changes noted. The patient denies history of DVT, PE or superficial thrombophlebitis. The patient denies recent episodes of angina or shortness of breath.   Current Facility-Administered Medications for the 10/14/17 encounter [facility] Encounter)  Medication  . docusate sodium (COLACE) capsule 100 mg  . heparin injection 5,000 Units  . piperacillin-tazobactam (ZOSYN) 3.375 g in dextrose 5 % 50 mL IVPB  . senna (SENOKOT) tablet 8.6 mg   Current Meds  Medication Sig  . aspirin EC 81 MG tablet Take 81 mg by mouth daily.  Marland Kitchen  diltiazem (CARDIZEM CD) 240 MG 24 hr capsule Take 300 mg by mouth daily.   Marland Kitchen docusate sodium (COLACE) 100 MG capsule Take 100 mg by mouth 2 (two) times daily.  . famotidine (PEPCID) 20 MG tablet Take 20 mg by mouth 2 (two) times daily.  . insulin aspart (NOVOLOG) 100 UNIT/ML injection Inject 0-5 Units into the skin 3 (three) times daily with meals.   . insulin glargine (LANTUS) 100 UNIT/ML injection Inject 0.1 mLs (10 Units total) into the skin at bedtime.  Marland Kitchen JARDIANCE 10 MG TABS tablet Take 10 mg by mouth daily.   Marland Kitchen lamoTRIgine (LAMICTAL) 25 MG tablet Take 100 mg by mouth 2 (two) times daily.   . meloxicam (MOBIC) 7.5 MG tablet Take 7.5 mg by mouth daily.  . metFORMIN (GLUCOPHAGE) 1000 MG tablet TAKE 1 TABLET TWICE A DAY WITH MEALS  . naproxen sodium (ALEVE) 220 MG tablet Take 220 mg by mouth 2 (two) times daily as needed.  Marland Kitchen oxyCODONE-acetaminophen (PERCOCET/ROXICET) 5-325 MG tablet Take 1 tablet by mouth every 6 (six) hours as needed for severe pain.  . simethicone (MYLICON) 80 MG chewable tablet Chew 80 mg by mouth every 6 (six) hours as needed for flatulence.    Past Medical History:  Diagnosis Date  . Arthritis   . Atrial fibrillation ([place])   . Carcinoma of prostate ([name])   . CHF (congestive heart failure) ([place])   . Chronic kidney disease   . Diabetes mellitus without complication ([place])   .  ED (erectile dysfunction)   . Frequent urination   . Hyperlipidemia   . Hypertension   . Moderate mitral insufficiency   . Peripheral vascular disease ([place])   . Pneumonia 04/2016  . Prostate cancer ([place])   . Sleep apnea    OSA--USE C-PAP  . Ulcer of left foot due to type 2 diabetes mellitus ([place])   . Urinary stress incontinence, male     Past Surgical History:  Procedure Laterality Date  . AMPUTATION Left 01/12/2017   Procedure: AMPUTATION BELOW KNEE;  Surgeon: Algernon Huxley, MD;  Location: [facility] ORS;  Service: General;  Laterality: Left;  . APLIGRAFT PLACEMENT Left 10/15/2016    Procedure: APLIGRAFT PLACEMENT;  Surgeon: Albertine Patricia, DPM;  Location: [facility] ORS;  Service: Podiatry;  Laterality: Left;  necrotic ulcer  . CHOLECYSTECTOMY    . EYE SURGERY Bilateral    Cataract Extraction with IOL  . IRRIGATION AND DEBRIDEMENT FOOT Left 10/15/2016   Procedure: IRRIGATION AND DEBRIDEMENT FOOT-EXCISIONAL DEBRIDEMENT OF SKIN 3RD, 4TH AND 5TH TOES WITH APPLICATION OF APLIGRAFT;  Surgeon: Albertine Patricia, DPM;  Location: [facility] ORS;  Service: Podiatry;  Laterality: Left;  necrotic,gangrene  . IRRIGATION AND DEBRIDEMENT FOOT Left 12/09/2016   Procedure: IRRIGATION AND DEBRIDEMENT FOOT-MUSCLE/FASCIA, RESECTION OF FOURTH AND FIFTH METATARSAL NECROTIC BONE;  Surgeon: Albertine Patricia, DPM;  Location: [facility] ORS;  Service: Podiatry;  Laterality: Left;  . LOWER EXTREMITY ANGIOGRAPHY Left 08/16/2016   Procedure: Lower Extremity Angiography;  Surgeon: Algernon Huxley, MD;  Location: [place] CV LAB;  Service: Cardiovascular;  Laterality: Left;  . LOWER EXTREMITY ANGIOGRAPHY Left 09/01/2016   Procedure: Lower Extremity Angiography;  Surgeon: Algernon Huxley, MD;  Location: [place] CV LAB;  Service: Cardiovascular;  Laterality: Left;  . LOWER EXTREMITY ANGIOGRAPHY Left 10/14/2016   Procedure: Lower Extremity Angiography;  Surgeon: Algernon Huxley, MD;  Location: [place] CV LAB;  Service: Cardiovascular;  Laterality: Left;  . LOWER EXTREMITY ANGIOGRAPHY Left 12/20/2016   Procedure: Lower Extremity Angiography;  Surgeon: Algernon Huxley, MD;  Location: [place] CV LAB;  Service: Cardiovascular;  Laterality: Left;  . LOWER EXTREMITY INTERVENTION  09/01/2016   Procedure: Lower Extremity Intervention;  Surgeon: Algernon Huxley, MD;  Location: [place] CV LAB;  Service: Cardiovascular;;  . PROSTATE SURGERY     removal  . TONSILLECTOMY     as a child    Social History Social History   Tobacco Use  . Smoking status: Former Smoker    Packs/day: 0.25    Types: Cigarettes    Last attempt to  quit: 10/14/1994    Years since quitting: 23.0  . Smokeless tobacco: Never Used  Substance Use Topics  . Alcohol use: No    Alcohol/week: 0.0 oz  . Drug use: No    Family History Family History  Problem Relation Age of Onset  . Hypertension Mother   . Diabetes Mother   . Prostate cancer Neg Hx   . Bladder Cancer Neg Hx   . Kidney disease Neg Hx   No family history of bleeding/clotting disorders, porphyria or autoimmune disease   Allergies  Allergen Reactions  . Contrast Media [Iodinated Diagnostic Agents] Shortness Of Breath    SOB  . Iohexol Shortness Of Breath     Desc: Respiratory Distress, Laryngedema, diaphoresis, Onset Date: [id number]   . Metrizamide Shortness Of Breath    SOB SOB SOB   . Latex Rash     REVIEW OF SYSTEMS (Negative unless checked)  Constitutional: [] Weight  loss  [] Fever  [] Chills Cardiac: [] Chest pain   [] Chest pressure   [] Palpitations   [] Shortness of breath when laying flat   [] Shortness of breath at rest   [x] Shortness of breath with exertion. Vascular:  [x] Pain in legs with walking   [] Pain in legs at rest   [] Pain in legs when laying flat   [] Claudication   [] Pain in feet when walking  [] Pain in feet at rest  [] Pain in feet when laying flat   [] History of DVT   [] Phlebitis   [] Swelling in legs   [] Varicose veins   [x] Non-healing ulcers Pulmonary:   [] Uses home oxygen   [] Productive cough   [] Hemoptysis   [] Wheeze  [] COPD   [] Asthma Neurologic:  [] Dizziness  [] Blackouts   [] Seizures   [] History of stroke   [] History of TIA  [] Aphasia   [] Temporary blindness   [] Dysphagia   [] Weakness or numbness in arms   [] Weakness or numbness in legs Musculoskeletal:  [] Arthritis   [] Joint swelling   [] Joint pain   [] Low back pain Hematologic:  [] Easy bruising  [] Easy bleeding   [] Hypercoagulable state   [] Anemic  [] Hepatitis Gastrointestinal:  [] Blood in stool   [] Vomiting blood  [] Gastroesophageal reflux/heartburn   [] Difficulty swallowing. Genitourinary:   [] Chronic kidney disease   [] Difficult urination  [] Frequent urination  [] Burning with urination   [] Blood in urine Skin:  [] Rashes   [x] Ulcers   [] Wounds Psychological:  [] History of anxiety   []  History of major depression.  Physical Examination  Vitals:   10/18/17 1800 10/18/17 1900 10/18/17 2000 10/18/17 2114  BP:  127/87  109/66  Pulse: (!) 143 90  89  Resp: 17 (!) 24  18  Temp:   97.9 F (36.6 C) (!) 97.5 F (36.4 C)  TempSrc:   Oral Oral  SpO2: 96% 96% 96% 96%  Weight:    173 lb (78.5 kg)  Height:    5\' 7"  (1.702 m)   Body mass index is 27.1 kg/m. Gen:  WD/WN, NAD Head: [place]/AT, No temporalis wasting. Prominent temp pulse not noted. Ear/Nose/Throat: Hearing grossly intact, nares w/o erythema or drainage, oropharynx w/o Erythema/Exudate Eyes: PERRLA, EOMI.  Neck: Supple, no nuchal rigidity.  No bruit or JVD.  Pulmonary:  Good air movement, clear to auscultation bilaterally.  Cardiac: RRR, normal S1, S2, no Murmurs, rubs or gallops. Vascular: right medial ankle ulcer Vessel Right Left  Radial Palpable Palpable  PT Not Palpable BKA  DP Not Palpable BKA  Gastrointestinal: soft, non-tender/non-distended. No guarding/reflex. No masses, surgical incisions, or scars. Musculoskeletal: M/S 5/5 throughout.  Extremities without ischemic changes.  No atrophy. No edema. Neurologic: CN 2-12 intact. Pain and light touch intact in extremities.  Symmetrical.  Speech is fluent. Motor exam as listed above. Psychiatric: Judgment intact, Mood & affect appropriate for pt's clinical situation. Dermatologic: No rashes + right ulcers noted.  No cellulitis or open wounds. Lymph : No Cervical, Axillary, or Inguinal lymphadenopathy.    CBC Lab Results  Component Value Date   WBC 9.5 10/18/2017   HGB 14.1 10/18/2017   HCT 41.7 10/18/2017   MCV 82.3 10/18/2017   PLT 178 10/18/2017    BMET    Component Value Date/Time   NA 138 10/17/2017 0245   NA 140 01/20/2013 0255   K 3.6 10/17/2017  0245   K 3.5 01/20/2013 0255   CL 105 10/17/2017 0245   CL 103 01/20/2013 0255   CO2 21 (L) 10/17/2017 0245   CO2 30 01/20/2013 0255  GLUCOSE 122 (H) 10/17/2017 0245   GLUCOSE 145 (H) 01/20/2013 0255   BUN 35 (H) 10/17/2017 0245   BUN 22 (H) 01/20/2013 0255   CREATININE 1.20 10/17/2017 0245   CREATININE 1.09 01/20/2013 0255   CALCIUM 8.4 (L) 10/17/2017 0245   CALCIUM 9.4 01/20/2013 0255   GFRNONAA 55 (L) 10/17/2017 0245   GFRNONAA >60 01/20/2013 0255   GFRAA >60 10/17/2017 0245   GFRAA >60 01/20/2013 0255   Estimated Creatinine Clearance: 45.9 mL/min (by C-G formula based on SCr of 1.2 mg/dL).  COAG Lab Results  Component Value Date   INR 1.14 10/15/2017   INR 1.13 10/15/2017   INR 1.44 02/10/2017    Radiology Dg Chest Port 1 View  Result Date: 10/18/2017 CLINICAL DATA:  CHF. EXAM: PORTABLE CHEST 1 VIEW COMPARISON:  No prior. FINDINGS: Cardiomegaly with bilateral pulmonary interstitial prominence suggesting CHF. Small bilateral pleural effusions. No pneumothorax. Degenerative changes thoracic spine and both shoulders. IMPRESSION: Cardiomegaly with increased interstitial prominence suggesting CHF. Small bilateral pleural effusions. Electronically Signed   By: Marcello Moores  Register   On: 10/18/2017 07:03   Dg Chest Port 1 View  Result Date: 10/17/2017 CLINICAL DATA:  Congestive heart failure.  Atrial fibrillation. EXAM: PORTABLE CHEST 1 VIEW COMPARISON:  10/15/2017 FINDINGS: Midline trachea. Cardiomegaly accentuated by AP portable technique. Atherosclerosis in the transverse aorta. No pleural effusion or pneumothorax. Improvement in interstitial edema with mild pulmonary venous congestion remaining. IMPRESSION: Cardiomegaly with improved interstitial edema/venous congestion. Aortic Atherosclerosis (ICD10-I70.0). Electronically Signed   By: Abigail Miyamoto M.D.   On: 10/17/2017 08:33   Dg Chest Port 1 View  Result Date: 10/15/2017 CLINICAL DATA:  Respiratory failure EXAM: PORTABLE CHEST  1 VIEW COMPARISON:  10/15/2017 FINDINGS: The heart is mildly enlarged. Vascular congestion. [place] B-lines towards the lung bases are noted bilaterally. Interstitial opacities are present throughout both central and basilar lung zones. No pneumothorax. IMPRESSION: CHF with interstitial edema Electronically Signed   By: Marybelle Killings M.D.   On: 10/15/2017 14:13   Dg Chest Port 1 View  Result Date: 10/15/2017 CLINICAL DATA:  Wound to the right lower leg, AFib EXAM: PORTABLE CHEST 1 VIEW COMPARISON:  01/12/2017, 06/03/2016 FINDINGS: Mild cardiomegaly. Diffuse increased interstitial opacity, suspicious for acute interstitial inflammation or edema. No large effusion. Aortic atherosclerosis. No pneumothorax. IMPRESSION: 1. Cardiomegaly with vascular congestion and mild to moderate diffuse increased interstitial opacity, favored to represent interstitial edema. 2. No significant effusion or focal airspace disease. Electronically Signed   By: Donavan Foil M.D.   On: 10/15/2017 01:00     Assessment/Plan 1.  Atherosclerosis bilateral lower extremities with ulceration right ankle:   Recommend:  The patient has evidence of severe atherosclerotic changes of both lower extremities associated with ulceration and tissue loss of the foot.  This represents a limb threatening ischemia and places the patient at the risk for limb loss.  Patient should undergo angiography of the lower extremities with the hope for intervention for limb salvage.  The risks and benefits as well as the alternative therapies was discussed in detail with the patient.  All questions were answered.  Patient agrees to proceed with angiography.  The patient will follow up with me in the office after the procedure.   2.  Hypertension: Continue antihypertensive medications as already ordered, these medications have been reviewed and there are no changes at this time.  3.  Hyperlipidemia:  Continue statin as ordered and reviewed, no changes at  this time  4.  A-fib: Continue antiarrhythmia  medications as already ordered, these medications have been reviewed and there are no changes at this time.  Continue anticoagulation as ordered by Cardiology Service  5.  Diabetes: Continue hypoglycemic medications as already ordered, these medications have been reviewed and there are no changes at this time.  Hgb A1C to be monitored as already arranged by primary service   Hortencia Pilar, MD  10/18/2017 10:41 PM

## 2017-10-18 NOTE — Progress Notes (Signed)
Dr. Saralyn Pilar at bedside speaking with pt. Re: procedure now. 2 RN's called son , Despina Hick, to obtain consent for pt. Pt. Confused as to procedure and month.. Pt. Able to state name and DOB.

## 2017-10-18 NOTE — Progress Notes (Signed)
Name: [name] MRN: [id number] DOB: 03/21/1935     CONSULTATION DATE: 10/14/2017  Continues on Cardizem and heparin drip and no major issues last night  PAST MEDICAL HISTORY :   has a past medical history of Arthritis, Atrial fibrillation ([place]), Carcinoma of prostate ([place]), CHF (congestive heart failure) ([place]), Chronic kidney disease, Diabetes mellitus without complication ([place]), ED (erectile dysfunction), Frequent urination, Hyperlipidemia, Hypertension, Moderate mitral insufficiency, Peripheral vascular disease ([place]), Pneumonia (04/2016), Prostate cancer [facility]), Sleep apnea, Ulcer of left foot due to type 2 diabetes mellitus ([place]), and Urinary stress incontinence, male.  has a past surgical history that includes Prostate surgery; Tonsillectomy; Eye surgery (Bilateral); Cholecystectomy; Lower Extremity Angiography (Left, 08/16/2016); Lower Extremity Angiography (Left, 09/01/2016); LOWER EXTREMITY INTERVENTION (09/01/2016); Irrigation and debridement foot (Left, 10/15/2016); Apligraft placement (Left, 10/15/2016); Lower Extremity Angiography (Left, 10/14/2016); Irrigation and debridement foot (Left, 12/09/2016); Lower Extremity Angiography (Left, 12/20/2016); and Amputation (Left, 01/12/2017). Prior to Admission medications   Medication Sig Start Date End Date Taking? Authorizing Provider  aspirin EC 81 MG tablet Take 81 mg by mouth daily.   Yes [provider]  diltiazem (CARDIZEM CD) 240 MG 24 hr capsule Take 300 mg by mouth daily.    Yes [provider]  docusate sodium (COLACE) 100 MG capsule Take 100 mg by mouth 2 (two) times daily.   Yes [provider]  famotidine (PEPCID) 20 MG tablet Take 20 mg by mouth 2 (two) times daily.   Yes [provider]  insulin aspart (NOVOLOG) 100 UNIT/ML injection Inject 0-5 Units into the skin 3 (three) times daily with meals.    Yes [provider]  insulin glargine (LANTUS) 100 UNIT/ML injection Inject 0.1 mLs  (10 Units total) into the skin at bedtime. 01/19/17  Yes Sainani, Belia Heman, MD  JARDIANCE 10 MG TABS tablet Take 10 mg by mouth daily.  06/28/17  Yes [provider]  lamoTRIgine (LAMICTAL) 25 MG tablet Take 100 mg by mouth 2 (two) times daily.  04/20/17  Yes [provider]  meloxicam (MOBIC) 7.5 MG tablet Take 7.5 mg by mouth daily.   Yes [provider]  metFORMIN (GLUCOPHAGE) 1000 MG tablet TAKE 1 TABLET TWICE A DAY WITH MEALS 11/28/15  Yes [provider]  naproxen sodium (ALEVE) 220 MG tablet Take 220 mg by mouth 2 (two) times daily as needed.   Yes [provider]  oxyCODONE-acetaminophen (PERCOCET/ROXICET) 5-325 MG tablet Take 1 tablet by mouth every 6 (six) hours as needed for severe pain.   Yes [provider]  simethicone (MYLICON) 80 MG chewable tablet Chew 80 mg by mouth every 6 (six) hours as needed for flatulence.   Yes [provider]  atorvastatin (LIPITOR) 80 MG tablet Take 80 mg by mouth daily at 6 PM.     [provider]  digoxin (LANOXIN) 0.125 MG tablet Take 1 tablet (0.125 mg total) by mouth daily. Patient not taking: Reported on 07/22/2017 01/20/17   Henreitta Leber, MD  furosemide (LASIX) 20 MG tablet Take 1 tablet (20 mg total) by mouth daily. Patient not taking: Reported on 10/15/2017 02/13/17   Schnier, Dolores Lory, MD  lubiprostone (AMITIZA) 24 MCG capsule Take by mouth.    [provider]  mirabegron ER (MYRBETRIQ) 25 MG TB24 tablet Take 1 tablet (25 mg total) by mouth daily. Patient not taking: Reported on 10/15/2017 08/19/17   Zara Council A, PA-C  oxyCODONE (OXY IR/ROXICODONE) 5 MG immediate release tablet Take 1 tablet (5  mg total) by mouth every 6 (six) hours as needed for moderate pain. Patient not taking: Reported on 07/22/2017 01/19/17   Henreitta Leber, MD  oxyCODONE (OXYCONTIN) 10 mg 12 hr tablet Take 1 tablet (10 mg total) by mouth every 12 (twelve) hours. Patient not taking: Reported on  07/22/2017 01/19/17   Henreitta Leber, MD  potassium chloride SA (K-DUR,KLOR-CON) 20 MEQ tablet Take by mouth. 03/29/17   [provider]  Rivaroxaban (XARELTO) 15 MG TABS tablet Take by mouth.    [provider]  silver sulfADIAZINE (SILVADENE) 1 % cream Apply topically daily. Patient not taking: Reported on 10/15/2017 02/12/17   Schnier, Dolores Lory, MD  TOPROL XL 100 MG 24 hr tablet Take 100 mg by mouth daily. 12/25/16   [provider]  TRULICITY 9.52 WU/1.3KG SOPN 0.75 mg once a week. 11/14/16   [provider]   Allergies  Allergen Reactions  . Contrast Media [Iodinated Diagnostic Agents] Shortness Of Breath    SOB  . Iohexol Shortness Of Breath     Desc: Respiratory Distress, Laryngedema, diaphoresis, Onset Date: [id number]   . Metrizamide Shortness Of Breath    SOB SOB SOB   . Latex Rash    FAMILY HISTORY:  family history includes Diabetes in his mother; Hypertension in his mother. SOCIAL HISTORY:  reports that he quit smoking about 23 years ago. His smoking use included cigarettes. He smoked 0.25 packs per day. He has never used smokeless tobacco. He reports that he does not drink alcohol or use drugs.  REVIEW OF SYSTEMS:   Unable to obtain due to critical illness   VITAL SIGNS: Temp:  [97.3 F (36.3 C)-98.9 F (37.2 C)] 98.4 F (36.9 C) (05/28 1021) Pulse Rate:  [57-107] 107 (05/28 1100) Resp:  [0-33] 27 (05/28 1100) BP: (92-165)/(66-108) 92/80 (05/28 1021) SpO2:  [91 %-98 %] 95 % (05/28 1100) Weight:  [82.1 kg (181 lb)-82.3 kg (181 lb 7 oz)] 82.1 kg (181 lb) (05/28 1021)  Physical Examination:  A + O and no acute neuro deficits On [place], no distress, able to talk in full sentences, BEAE and no rales. S1 &  S2 audible and no murmur Benign abdomen with normal peristalses Lt. BKA, Rt. Leg ulcer and no edema   ASSESSMENT / PLAN:  Acute respiratory failure (improved) tolerating [place] most of the day, requires NPPV at night and  PRN.  NSTEMI. ECHO LVEF 20-25% with anterior, apical and septal akinesis A fib with RVR. On Heparin gtt, Cardizem + Dig (Level 0.8) + metoprolol  ASA + Statin -Management as per cardiology and no invasive ischemic work up.  HONK and uncontrolled DM -Glycemic control.  CHF with decompensation and  ischemic cardiomyopathy. -Optimize diuresis  Diabetic foot ulcer -Wound care  AKI on CKD. -Avoid nephrotoxins, monitor renal panel and urine out put.  PAD s/p Lt. BKA -Supportive care  Full code  DVT & GI prophylaxis. Continue with supportive care  Critical care time 40

## 2017-10-18 NOTE — Consult Note (Signed)
[place] Nurse wound consult note Reason for Consult: Right lower leg wound that has worsened since a "rash" had been observed by the patient and his wife. Wound type: I suspect it is arterial in nature, from arterial insufficiency.  I am unable to palpate a dorsalis pedis pulse to the right foot. Measurement: 2.3 cm x 2.5 cm x unknown depth due to blackened eschar covering wound bed. Wound bed: blackened eschar Drainage (amount, consistency, odor) no drainage, no odor at the time of my evaluation in ICU 07. Periwound: Intact, dark purple.  I question if this darkened tissue that surrounds the wound represents DTI from the pressure wrap the wife and primary nurse describe that was on the lower extremity.  It seems an Haematologist was removed this morning to reveal the wounded area.  Also, the foot has petechiae and purple discoloration along the margins of the foot.  The patient had been receiving treatment from a community podiatrist for the area prior to his hospitalization.  We area awaiting a vascular consult. Dressing procedure/placement/frequency: Cleanse right lower leg wound with normal saline. Pat dry. Apply Xeroform over the wound, then wrap in kerlex.  Change daily. Monitor the wound area(s) for worsening of condition such as: Signs/symptoms of infection,  Increase in size,  Development of or worsening of odor, Development of pain, or increased pain at the affected locations.  Notify the medical team if any of these develop.  Thank you for the consult.  Discussed plan of care with the patient and bedside nurse.  [place] nurse will not follow at this time.  Please re-consult the [place] team if needed.  Val Riles, RN, MSN, CWOCN, CNS-BC, pager [phone]

## 2017-10-18 NOTE — Progress Notes (Signed)
After speaking with pt. Son and pt. Wife, MD cancelling procedure now.

## 2017-10-18 NOTE — Progress Notes (Signed)
Dr. Saralyn Pilar calling son Tayshon Winker to speak to him re: cath today . MD explaining procedure and obtaining consent also via phone.

## 2017-10-18 NOTE — Progress Notes (Signed)
Patient finally agreed to take his PO meds approx. 1 hour ago. HR continues to go into the 130's-150's. Dr. Saralyn Pilar was paged. Verbal order w/ read back given for 15 mg Cardizem IVP x 1.

## 2017-10-19 ENCOUNTER — Inpatient Hospital Stay: Payer: Medicare Other

## 2017-10-19 DIAGNOSIS — Z7189 Other specified counseling: Secondary | ICD-10-CM

## 2017-10-19 DIAGNOSIS — Z515 Encounter for palliative care: Secondary | ICD-10-CM

## 2017-10-19 LAB — URINALYSIS, ROUTINE W REFLEX MICROSCOPIC
Bilirubin Urine: NEGATIVE
Glucose, UA: 150 mg/dL — AB
Ketones, ur: NEGATIVE mg/dL
Leukocytes, UA: NEGATIVE
Nitrite: NEGATIVE
Protein, ur: NEGATIVE mg/dL
SPECIFIC GRAVITY, URINE: 1.006 (ref 1.005–1.030)
SQUAMOUS EPITHELIAL / LPF: NONE SEEN (ref 0–5)
pH: 5 (ref 5.0–8.0)

## 2017-10-19 LAB — PHOSPHORUS: PHOSPHORUS: 2.7 mg/dL (ref 2.5–4.6)

## 2017-10-19 LAB — GLUCOSE, CAPILLARY
GLUCOSE-CAPILLARY: 259 mg/dL — AB (ref 65–99)
GLUCOSE-CAPILLARY: 191 mg/dL — AB (ref 65–99)
GLUCOSE-CAPILLARY: 161 mg/dL — AB (ref 65–99)
Glucose-Capillary: 278 mg/dL — ABNORMAL HIGH (ref 65–99)
Glucose-Capillary: 263 mg/dL — ABNORMAL HIGH (ref 65–99)

## 2017-10-19 LAB — CBC
HEMATOCRIT: 40.4 % (ref 40.0–52.0)
HEMOGLOBIN: 13.5 g/dL (ref 13.0–18.0)
MCH: 27.5 pg (ref 26.0–34.0)
MCHC: 33.4 g/dL (ref 32.0–36.0)
MCV: 82.2 fL (ref 80.0–100.0)
Platelets: 196 10*3/uL (ref 150–440)
RBC: 4.92 MIL/uL (ref 4.40–5.90)
RDW: 18.2 % — ABNORMAL HIGH (ref 11.5–14.5)
WBC: 10.2 10*3/uL (ref 3.8–10.6)

## 2017-10-19 LAB — BASIC METABOLIC PANEL
Anion gap: 8 (ref 5–15)
BUN: 41 mg/dL — AB (ref 6–20)
CO2: 22 mmol/L (ref 22–32)
CREATININE: 1.25 mg/dL — AB (ref 0.61–1.24)
Calcium: 7.8 mg/dL — ABNORMAL LOW (ref 8.9–10.3)
Chloride: 106 mmol/L (ref 101–111)
GFR calc Af Amer: >60 mL/min (ref >60)
GFR calc non Af Amer: 53 mL/min — ABNORMAL LOW (ref >60)
Glucose, Bld: 222 mg/dL — ABNORMAL HIGH (ref 65–99)
Potassium: 3.3 mmol/L — ABNORMAL LOW (ref 3.5–5.1)
Sodium: 136 mmol/L (ref 135–145)

## 2017-10-19 LAB — MAGNESIUM: Magnesium: 1.9 mg/dL (ref 1.7–2.4)

## 2017-10-19 LAB — BRAIN NATRIURETIC PEPTIDE: B Natriuretic Peptide: 2483 pg/mL — ABNORMAL HIGH (ref 0.0–100.0)

## 2017-10-19 MED ORDER — ZIPRASIDONE MESYLATE 20 MG IM SOLR
10.0000 mg | Freq: Once | INTRAMUSCULAR | Status: DC
  Filled 2017-10-19: qty 20

## 2017-10-19 MED ORDER — POTASSIUM CHLORIDE CRYS ER 20 MEQ PO TBCR
20.0000 meq | EXTENDED_RELEASE_TABLET | Freq: Two times a day (BID) | ORAL | Status: DC
  Administered 2017-10-19 – 2017-10-28 (×12): 20 meq via ORAL
  Filled 2017-10-19 (×15): qty 1

## 2017-10-19 MED ORDER — RISPERIDONE 0.5 MG PO TABS
0.5000 mg | ORAL_TABLET | Freq: Every day | ORAL | Status: DC
  Administered 2017-10-19 – 2017-10-23 (×6): 0.5 mg via ORAL
  Filled 2017-10-19 (×11): qty 1

## 2017-10-19 MED ORDER — ZIPRASIDONE MESYLATE 20 MG IM SOLR
20.0000 mg | Freq: Once | INTRAMUSCULAR | Status: AC
  Administered 2017-10-19: 20 mg via INTRAMUSCULAR
  Filled 2017-10-19: qty 20

## 2017-10-19 NOTE — Consult Note (Addendum)
Consultation Note Date: 10/19/2017   Patient Name: [name]  DOB: November 04, 1934  MRN: [id number]  Age / Sex: 83 y.o., male  PCP: Alanson Aly, [place] Referring Physician: Vaughan Basta, *  Reason for Consultation: Establishing goals of care  HPI/Patient Profile: [name]  is a 83 y.o. male with a known history of AF (Xarelto), DM, PVD, T2IDDM, AF + RVR. Pt w/ L BKA, RLE wound.   Clinical Assessment and Goals of Care: [name] is resting in bed, wife at bedside. He is oriented to name, children's name, date, place, address, DOB, president, and preference not to have [place] drinks.  They have been married 70 years and have 2 children. They live at home together. He is retired from [employer]. He had an amputation last year and has been working with PT on using his new prosthetic. He states he walks with a cane. He has been eating well.   We discussed diagnosis, prognosis, GOC, EOL wishes disposition and options.   The difference between an aggressive medical intervention path and a hospice comfort care path for this patient at this time was discussed.  Values and goals of care important to patient and family were attempted to be elicited. We discussed his diagnoses, prognosis, and GOC.   Concepts specific to code status, artifical feeding and hydration,  IV antibiotics and rehospitalization was discussed.    He states he has been told the medical team is watching his leg. He  has not given advanced directives a lot of thought and will need to consider his options. He states he would like to take one day at a time. He tells me he would never want live be in a vegetative state. He states existing is not living. He is unsure about code status.    SUMMARY OF RECOMMENDATIONS    Recommend palliative to follow outpatient.    Code Status/Advance Care Planning:  Full code    Symptom  Management:   Per primary team.   Palliative Prophylaxis:   Eye Care and Oral Care  Prognosis:   Poor overall.   Discharge Planning: To Be Determined      Primary Diagnoses: Present on Admission: . Atrial fibrillation with rapid ventricular response ([place])   I have reviewed the medical record, interviewed the patient and family, and examined the patient. The following aspects are pertinent.  Past Medical History:  Diagnosis Date  . Arthritis   . Atrial fibrillation ([place])   . Carcinoma of prostate ([place])   . CHF (congestive heart failure) ([place])   . Chronic kidney disease   . Diabetes mellitus without complication ([place])   . ED (erectile dysfunction)   . Frequent urination   . Hyperlipidemia   . Hypertension   . Moderate mitral insufficiency   . Peripheral vascular disease ([place])   . Pneumonia 04/2016  . Prostate cancer ([place])   . Sleep apnea    OSA--USE C-PAP  . Ulcer of left foot due to type 2 diabetes mellitus ([place])   . Urinary  stress incontinence, male    Social History   Socioeconomic History  . Marital status: Married    Spouse name: Not on file  . Number of children: Not on file  . Years of education: Not on file  . Highest education level: Not on file  Occupational History  . Occupation: retired  Scientific laboratory technician  . Financial resource strain: Not on file  . Food insecurity:    Worry: Not on file    Inability: Not on file  . Transportation needs:    Medical: Not on file    Non-medical: Not on file  Tobacco Use  . Smoking status: Former Smoker    Packs/day: 0.25    Types: Cigarettes    Last attempt to quit: 10/14/1994    Years since quitting: 23.0  . Smokeless tobacco: Never Used  Substance and Sexual Activity  . Alcohol use: No    Alcohol/week: 0.0 oz  . Drug use: No  . Sexual activity: Not on file  Lifestyle  . Physical activity:    Days per week: Not on file    Minutes per session: Not on file  . Stress: Not on file  Relationships  . Social  connections:    Talks on phone: Not on file    Gets together: Not on file    Attends religious service: Not on file    Active member of club or organization: Not on file    Attends meetings of clubs or organizations: Not on file    Relationship status: Not on file  Other Topics Concern  . Not on file  Social History Narrative  . Not on file   Family History  Problem Relation Age of Onset  . Hypertension Mother   . Diabetes Mother   . Prostate cancer Neg Hx   . Bladder Cancer Neg Hx   . Kidney disease Neg Hx    Scheduled Meds: . aspirin  81 mg Oral Daily  . atorvastatin  80 mg Oral q1800  . collagenase   Topical Daily  . digoxin  0.125 mg Oral Daily  . diltiazem  300 mg Oral Daily  . docusate sodium  100 mg Oral Daily  . famotidine  20 mg Oral BID  . furosemide  40 mg Intravenous Daily  . insulin aspart  0-15 Units Subcutaneous Q4H  . insulin glargine  15 Units Subcutaneous QHS  . lamoTRIgine  100 mg Oral BID  . mouth rinse  15 mL Mouth Rinse BID  . metoprolol succinate  100 mg Oral Daily  . risperiDONE  0.5 mg Oral QHS  . rivaroxaban  20 mg Oral Q supper   Continuous Infusions: PRN Meds:.acetaminophen, bisacodyl, dextrose, metoprolol tartrate, ondansetron (ZOFRAN) IV, senna-docusate Medications Prior to Admission:  Prior to Admission medications   Medication Sig Start Date End Date Taking? Authorizing Provider  aspirin EC 81 MG tablet Take 81 mg by mouth daily.   Yes [provider]  diltiazem (CARDIZEM CD) 240 MG 24 hr capsule Take 300 mg by mouth daily.    Yes [provider]  docusate sodium (COLACE) 100 MG capsule Take 100 mg by mouth 2 (two) times daily.   Yes [provider]  famotidine (PEPCID) 20 MG tablet Take 20 mg by mouth 2 (two) times daily.   Yes [provider]  insulin aspart (NOVOLOG) 100 UNIT/ML injection Inject 0-5 Units into the skin 3 (three) times daily with meals.    Yes [provider]  insulin  glargine (LANTUS) 100 UNIT/ML injection Inject 0.1 mLs (10 Units total) into the skin at bedtime. 01/19/17  Yes Sainani, Belia Heman, MD  JARDIANCE 10 MG TABS tablet Take 10 mg by mouth daily.  06/28/17  Yes [provider]  lamoTRIgine (LAMICTAL) 25 MG tablet Take 100 mg by mouth 2 (two) times daily.  04/20/17  Yes [provider]  meloxicam (MOBIC) 7.5 MG tablet Take 7.5 mg by mouth daily.   Yes [provider]  metFORMIN (GLUCOPHAGE) 1000 MG tablet TAKE 1 TABLET TWICE A DAY WITH MEALS 11/28/15  Yes [provider]  naproxen sodium (ALEVE) 220 MG tablet Take 220 mg by mouth 2 (two) times daily as needed.   Yes [provider]  oxyCODONE-acetaminophen (PERCOCET/ROXICET) 5-325 MG tablet Take 1 tablet by mouth every 6 (six) hours as needed for severe pain.   Yes [provider]  simethicone (MYLICON) 80 MG chewable tablet Chew 80 mg by mouth every 6 (six) hours as needed for flatulence.   Yes [provider]  atorvastatin (LIPITOR) 80 MG tablet Take 80 mg by mouth daily at 6 PM.     [provider]  digoxin (LANOXIN) 0.125 MG tablet Take 1 tablet (0.125 mg total) by mouth daily. Patient not taking: Reported on 07/22/2017 01/20/17   Henreitta Leber, MD  furosemide (LASIX) 20 MG tablet Take 1 tablet (20 mg total) by mouth daily. Patient not taking: Reported on 10/15/2017 02/13/17   Schnier, Dolores Lory, MD  lubiprostone (AMITIZA) 24 MCG capsule Take by mouth.    [provider]  mirabegron ER (MYRBETRIQ) 25 MG TB24 tablet Take 1 tablet (25 mg total) by mouth daily. Patient not taking: Reported on 10/15/2017 08/19/17   Zara Council A, PA-C  oxyCODONE (OXY IR/ROXICODONE) 5 MG immediate release tablet Take 1 tablet (5 mg total) by mouth every 6 (six) hours as needed for moderate pain. Patient not taking: Reported on 07/22/2017 01/19/17   Henreitta Leber, MD  oxyCODONE (OXYCONTIN) 10 mg 12 hr tablet Take 1 tablet (10 mg total) by mouth  every 12 (twelve) hours. Patient not taking: Reported on 07/22/2017 01/19/17   Henreitta Leber, MD  potassium chloride SA (K-DUR,KLOR-CON) 20 MEQ tablet Take by mouth. 03/29/17   [provider]  Rivaroxaban (XARELTO) 15 MG TABS tablet Take by mouth.    [provider]  silver sulfADIAZINE (SILVADENE) 1 % cream Apply topically daily. Patient not taking: Reported on 10/15/2017 02/12/17   Schnier, Dolores Lory, MD  TOPROL XL 100 MG 24 hr tablet Take 100 mg by mouth daily. 12/25/16   [provider]  TRULICITY 1.06 YI/9.4WN SOPN 0.75 mg once a week. 11/14/16   [provider]   Allergies  Allergen Reactions  . Contrast Media [Iodinated Diagnostic Agents] Shortness Of Breath    SOB  . Iohexol Shortness Of Breath     Desc: Respiratory Distress, Laryngedema, diaphoresis, Onset Date: [id number]   . Metrizamide Shortness Of Breath    SOB SOB SOB   . Latex Rash   Review of Systems  Musculoskeletal:       Leg pain    Physical Exam  Constitutional: No distress.  Pulmonary/Chest: Effort normal.  Neurological: He is alert.    Vital Signs: BP 108/69   Pulse (!) 58   Temp 97.7 F (36.5 C) (Oral)   Resp 18   Ht 5\' 7"  (1.702 m)   Wt 78.5 kg (173 lb)   SpO2 95%   BMI  27.10 kg/m  Pain Scale: 0-10   Pain Score: 0-No pain   SpO2: SpO2: 95 % O2 Device:SpO2: 95 % O2 Flow Rate: .O2 Flow Rate (L/min): 2 L/min  IO: Intake/output summary:   Intake/Output Summary (Last 24 hours) at 10/19/2017 1618 Last data filed at 10/19/2017 6063 Gross per 24 hour  Intake 240 ml  Output 2035 ml  Net -1795 ml    LBM: Last BM Date: (unknown on transfer from CCU) Baseline Weight: Weight: 81.6 kg (180 lb) Most recent weight: Weight: 78.5 kg (173 lb)     Palliative Assessment/Data: 40%     Time In: 3:20 Time Out: 4:30 Time Total: 70 min Greater than 50%  of this time was spent counseling and coordinating care related to the above assessment and plan.  Signed  by: Asencion Gowda, NP   Please contact Palliative Medicine Team phone at [phone] for questions and concerns.  For individual provider: See Shea Evans

## 2017-10-19 NOTE — Progress Notes (Signed)
Inpatient Diabetes Program Recommendations  AACE/ADA: New Consensus Statement on Inpatient Glycemic Control (2015)  Target Ranges:  Prepandial:   less than 140 mg/dL      Peak postprandial:   less than 180 mg/dL (1-2 hours)      Critically ill patients:  140 - 180 mg/dL   Results for Jesse, [name] (MRN [id number]) as of 10/19/2017 09:10  Ref. Range 10/17/2017 23:55 10/18/2017 03:56 10/18/2017 07:35 10/18/2017 10:59 10/18/2017 16:47 10/18/2017 19:42 10/18/2017 22:02  Glucose-Capillary Latest Ref Range: 65 - 99 mg/dL 243 (H)  5  units NOVOLOG 151 (H)  2  units NOVOLOG 99  0  units NOVOLOG 98  0  units NOVOLOG 205 (H)  5  units NOVOLOG 296 (H)  8  units NOVOLOG 239 (H)  5  units NOVOLOG +  15 units LANTUS   Results for Jesse, [name] (MRN [id number]) as of 10/19/2017 09:10  Ref. Range 10/19/2017 04:26 10/19/2017 07:38  Glucose-Capillary Latest Ref Range: 65 - 99 mg/dL 259 (H)  8  units NOVOLOG 191 (H)  3 units NOVOLOG     Home DM Meds: Lantus 10 units QHS       Novolog 0-5 units TID per SSI       Jardiance 10 mg daily       Trulicity 1.69 mg Qweek       Metformin 1000 mg BID  Current Insulin Orders: Lantus 15 units QHS      Novolog Moderate Correction Scale/ SSI (0-15 units) Q4 hours     Patient received 125 mg Solumedrol X 1 dose yesterday at 1030 am.  Solumedrol may explain why pt's glucose levels were elevated yesterday evening and again this AM.  Do not recommend increasing Lantus at this time.    MD- Please consider changing Novolog SSI timing to TID AC + HS (currently ordered Q4 hours and patient eating solid PO diet)     --Will follow patient during hospitalization--  Wyn Quaker RN, MSN, CDE Diabetes Coordinator Inpatient Glycemic Control Team Team Pager: [phone] (8a-5p)

## 2017-10-19 NOTE — Progress Notes (Signed)
Agitated and confused with attempts to get out of bed.  Removing telemetry monitor.  Unable to reorient.  MD notified.  Orders given.

## 2017-10-19 NOTE — Care Management Important Message (Signed)
Copy of signed IM left in patient's room.    

## 2017-10-19 NOTE — Progress Notes (Signed)
Remains confused and agitated, attempting to get out of bed.  Combative, hitting and kicking at staff with attempts to return to bed.  Code 300 called for security assistance.  MD made aware.  Orders given.

## 2017-10-19 NOTE — Progress Notes (Signed)
[place] [facility] [place] NAME: [name]    MR#:  [id number]  DATE OF BIRTH:  1934-10-08  SUBJECTIVE:  CHIEF COMPLAINT:   Chief Complaint  Patient presents with  . Tachycardia  . Hyperglycemia    Came with hypoglycemia, atrial fibrillation and shortness of breath   Again on bipap due to respi distress, troponin rise and now coming down.  much improved, off BiPAP and comfortable now. Cardiology decided No need for procedure.  he was agitated last night and received risperidone and geodone early morning. He was sleepy this morning , but woke up later.  REVIEW OF SYSTEMS:  CONSTITUTIONAL: No fever, fatigue or weakness.  EYES: No blurred or double vision.  EARS, NOSE, AND THROAT: No tinnitus or ear pain.  RESPIRATORY: No cough, have shortness of breath,no wheezing or hemoptysis.  CARDIOVASCULAR: No chest pain, orthopnea,have edema.  GASTROINTESTINAL: No nausea, vomiting, diarrhea or abdominal pain.  GENITOURINARY: No dysuria, hematuria.  ENDOCRINE: No polyuria, nocturia,  HEMATOLOGY: No anemia, easy bruising or bleeding SKIN: No rash or lesion. MUSCULOSKELETAL: No joint pain or arthritis.   NEUROLOGIC: No tingling, numbness, weakness.  PSYCHIATRY: No anxiety or depression.   ROS  DRUG ALLERGIES:   Allergies  Allergen Reactions  . Contrast Media [Iodinated Diagnostic Agents] Shortness Of Breath    SOB  . Iohexol Shortness Of Breath     Desc: Respiratory Distress, Laryngedema, diaphoresis, Onset Date: [id number]   . Metrizamide Shortness Of Breath    SOB SOB SOB   . Latex Rash    VITALS:  Blood pressure 108/69, pulse (!) 58, temperature 97.7 F (36.5 C), temperature source Oral, resp. rate 18, height 5\' 7"  (1.702 m), weight 78.5 kg (173 lb), SpO2 95 %.  PHYSICAL EXAMINATION:  GENERAL:  83 y.o.-year-old patient lying in the bed with  acute respiratory distress.  EYES: Pupils equal, round, reactive to light and accommodation.  No scleral icterus. Extraocular muscles intact.  HEENT: Head atraumatic, normocephalic. Oropharynx and nasopharynx clear.  NECK:  Supple, no jugular venous distention. No thyroid enlargement, no tenderness.  LUNGS: Normal breath sounds bilaterally, no wheezing,bilateral crepitation. No use of accessory muscles of respiration. Off bipap. CARDIOVASCULAR: S1, S2 normal. No murmurs, rubs, or gallops.  ABDOMEN: Soft, nontender, nondistended. Bowel sounds present. No organomegaly or mass.  EXTREMITIES: No pedal edema, cyanosis, or clubbing. Left-sided below knee amputation.   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 NEUROLOGIC: Cranial nerves II through XII are intact. Muscle strength 3-4/5 in all extremities. Sensation intact. Gait not checked.  PSYCHIATRIC: The patient is alert and oriented x 3.  SKIN: No obvious rash, lesion, or ulcer.   Physical Exam LABORATORY PANEL:   CBC Recent Labs  Lab 10/19/17 0659  WBC 10.2  HGB 13.5  HCT 40.4  PLT 196   ------------------------------------------------------------------------------------------------------------------  Chemistries  Recent Labs  Lab 10/15/17 0008  10/19/17 0659  NA 134*   < > 136  K 4.7   < > 3.3*  CL 99*   < > 106  CO2 19*   < > 22  GLUCOSE 686*   < > 222*  BUN 28*   < > 41*  CREATININE 1.29*   < > 1.25*  CALCIUM 8.9   < > 7.8*  MG 1.9   < > 1.9  AST 33  --   --   ALT 26  --   --  ALKPHOS 195*  --   --   BILITOT 1.1  --   --    < > = values in this interval not displayed.   ------------------------------------------------------------------------------------------------------------------  Cardiac Enzymes Recent Labs  Lab 10/16/17 2013 10/17/17 0245  TROPONINI 7.08* 5.41*   ------------------------------------------------------------------------------------------------------------------  RADIOLOGY:  Dg Chest Port 1  View  Result Date: 10/19/2017 CLINICAL DATA:  Atrial fibrillation with rapid ventricular response; CHF, chronic renal insufficiency, prostate malignancy, EXAM: PORTABLE CHEST 1 VIEW COMPARISON:  Portable chest x-ray of Oct 18, 2017 FINDINGS: The lungs are well-expanded. The interstitial markings remain increased greatest on the right but they have improved slightly since yesterday's study. The cardiac silhouette remains enlarged. The pulmonary vascularity remains engorged. There is calcification in the wall of the aortic arch. The bony thorax exhibits no acute abnormality. IMPRESSION: CHF with slight improvement in interstitial edema greatest on the right. No alveolar pneumonia nor significant pleural effusion. Thoracic aortic atherosclerosis. Electronically Signed   By: David  Martinique M.D.   On: 10/19/2017 08:56   Dg Chest Port 1 View  Result Date: 10/18/2017 CLINICAL DATA:  CHF. EXAM: PORTABLE CHEST 1 VIEW COMPARISON:  No prior. FINDINGS: Cardiomegaly with bilateral pulmonary interstitial prominence suggesting CHF. Small bilateral pleural effusions. No pneumothorax. Degenerative changes thoracic spine and both shoulders. IMPRESSION: Cardiomegaly with increased interstitial prominence suggesting CHF. Small bilateral pleural effusions. Electronically Signed   By: Marcello Moores  Register   On: 10/18/2017 07:03    ASSESSMENT AND PLAN:   Active Problems:   Atrial fibrillation with rapid ventricular response (HCC)  * non-ST elevation MI   Significant rise in his troponin, currently heparin IV drip and further management per cardiology.   On ASA, betablocker, atorvastatin.  plan for cath , but cardiology suggest to manage medically due to his being high risk.  * hyperglycemia- nonketotic   Responded to IV insulin drip, switch to long-acting insulin with sliding scale control.    stable now.   * atrial fibrillation   Likely secondary to stress of hyperglycemia and dehydration.   Currently on digoxin,  Cardizem and metoprolol oral.   Heparin IV drip. Stopped now  * acute systolic congestive heart failure   This is secondary to atrial fibrillation.   IV Lasix and BiPAP as needed.   Significant drop in EF, compared to past.   Cont lasix.  * right leg wound   Wound care team.   Appreciated help by podiatry and vascular- plan for angiogram.  * hypokalemia    Replace oral.  Prognosis very poor due to multiple medical issues- called palliative care. Pt is a poor candidate for rehab due to PVD and CAD with CHF.   All the records are reviewed and case discussed with Care Management/Social Workerr. Management plans discussed with the patient, family and they are in agreement.  CODE STATUS: Full.  TOTAL TIME TAKING CARE OF THIS PATIENT: 35 minutes.    POSSIBLE D/C IN 1-2 DAYS, DEPENDING ON CLINICAL CONDITION.   Vaughan Basta M.D on 10/19/2017   Between 7am to 6pm - Pager - [phone]  After 6pm go to [email or web address] - password [email or web address] [facility]  Office  [phone]  CC: Primary care physician; Alanson Aly, FNP  Note: This dictation was prepared with Dragon dictation along with smaller phrase technology. Any transcriptional errors that result from this process are unintentional.

## 2017-10-20 ENCOUNTER — Encounter: Payer: Medicare Other | Admitting: Physical Therapy

## 2017-10-20 DIAGNOSIS — I70238 Atherosclerosis of native arteries of right leg with ulceration of other part of lower right leg: Secondary | ICD-10-CM

## 2017-10-20 DIAGNOSIS — N393 Stress incontinence (female) (male): Secondary | ICD-10-CM

## 2017-10-20 LAB — GLUCOSE, CAPILLARY
GLUCOSE-CAPILLARY: 307 mg/dL — AB (ref 65–99)
GLUCOSE-CAPILLARY: 215 mg/dL — AB (ref 65–99)
GLUCOSE-CAPILLARY: 223 mg/dL — AB (ref 65–99)
Glucose-Capillary: 143 mg/dL — ABNORMAL HIGH (ref 65–99)

## 2017-10-20 LAB — MAGNESIUM: MAGNESIUM: 1.9 mg/dL (ref 1.7–2.4)

## 2017-10-20 LAB — CALCIUM, IONIZED: Calcium, Ionized, Serum: 4.7 mg/dL (ref 4.5–5.6)

## 2017-10-20 MED ORDER — SODIUM CHLORIDE 0.9% FLUSH
3.0000 mL | Freq: Two times a day (BID) | INTRAVENOUS | Status: DC
  Administered 2017-10-20 – 2017-10-28 (×14): 3 mL via INTRAVENOUS

## 2017-10-20 MED ORDER — INSULIN ASPART 100 UNIT/ML ~~LOC~~ SOLN
0.0000 [IU] | Freq: Every day | SUBCUTANEOUS | Status: DC
  Administered 2017-10-20 – 2017-10-22 (×2): 2 [IU] via SUBCUTANEOUS
  Administered 2017-10-25: 3 [IU] via SUBCUTANEOUS
  Filled 2017-10-20: qty 1
  Filled 2017-10-20: qty 0.02
  Filled 2017-10-20: qty 1

## 2017-10-20 MED ORDER — INSULIN ASPART 100 UNIT/ML ~~LOC~~ SOLN
4.0000 [IU] | Freq: Three times a day (TID) | SUBCUTANEOUS | Status: DC
  Administered 2017-10-20 – 2017-10-24 (×5): 4 [IU] via SUBCUTANEOUS
  Filled 2017-10-20 (×5): qty 1

## 2017-10-20 MED ORDER — POLYETHYLENE GLYCOL 3350 17 G PO PACK
17.0000 g | PACK | Freq: Every day | ORAL | Status: DC
  Administered 2017-10-20 – 2017-10-28 (×2): 17 g via ORAL
  Filled 2017-10-20 (×2): qty 1

## 2017-10-20 MED ORDER — SENNOSIDES-DOCUSATE SODIUM 8.6-50 MG PO TABS
1.0000 | ORAL_TABLET | Freq: Every day | ORAL | Status: DC
  Administered 2017-10-21 – 2017-10-28 (×7): 1 via ORAL
  Filled 2017-10-20 (×8): qty 1

## 2017-10-20 MED ORDER — INSULIN ASPART 100 UNIT/ML ~~LOC~~ SOLN
0.0000 [IU] | Freq: Three times a day (TID) | SUBCUTANEOUS | Status: DC
  Administered 2017-10-20: 5 [IU] via SUBCUTANEOUS
  Administered 2017-10-21: 2 [IU] via SUBCUTANEOUS
  Administered 2017-10-21: 3 [IU] via SUBCUTANEOUS
  Administered 2017-10-21 – 2017-10-22 (×2): 2 [IU] via SUBCUTANEOUS
  Administered 2017-10-22: 5 [IU] via SUBCUTANEOUS
  Administered 2017-10-22: 15 [IU] via SUBCUTANEOUS
  Administered 2017-10-23: 5 [IU] via SUBCUTANEOUS
  Administered 2017-10-24: 15 [IU] via SUBCUTANEOUS
  Administered 2017-10-25 (×2): 3 [IU] via SUBCUTANEOUS
  Filled 2017-10-20 (×11): qty 1

## 2017-10-20 NOTE — Plan of Care (Signed)
  Problem: Clinical Measurements: Goal: Ability to maintain clinical measurements within normal limits will improve Outcome: Progressing   Problem: Safety: Goal: Ability to remain free from injury will improve Outcome: Progressing   Problem: Skin Integrity: Goal: Risk for impaired skin integrity will decrease Outcome: Progressing   

## 2017-10-20 NOTE — Consult Note (Signed)
I have been asked to see the patient by Dr. Vaughan Basta, for evaluation and management of urinary incontinence.  History of present illness: 83 year old male admitted to the hospital for peripheral vascular disease.  He has an upcoming lower extremity angiogram and possible intervention.  He has a past medical history significant for prostate cancer and underwent a prostatectomy several years ago for low grade prostate cancer.  Since that time he has had post prostatectomy stress incontinence.  He has tried numerous medications including anticholinergics as well as Myrbetriq unsuccessfully.  He was going through 4 or 5 pads per day.  He was last seen in the urology clinic 3 weeks ago and was giving a penile clamp to help with the stress incontinence.  While this was effective, he found it uncomfortable.   Today the patient has an indwelling Foley catheter and finds it quite convenient.  He enjoys not being wet.  He inquires about alternative strategies.  He denies any dysuria or hematuria.  He denies any fevers or chills.  Review of systems: A 12 point comprehensive review of systems was obtained and is negative unless otherwise stated in the history of present illness.  Patient Active Problem List   Diagnosis Date Noted  . Atrial fibrillation with rapid ventricular response ([place]) 10/15/2017  . BKA stump complication ([place]) 52/84/1324  . 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 S/P BKA (below knee amputation) unilateral, left ([place]) 02/01/2017  . Chronic diastolic heart failure ([place]) 01/21/2017  . Pressure injury of skin 01/18/2017  . 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 Diabetic foot ulcer ([place]) 12/29/2016  . Ataxia 10/21/2016  . History of femoral angiogram 09/06/2016  . Left leg pain 09/06/2016  . Leukocytosis 09/06/2016  . Generalized weakness 09/06/2016  . A-fib ([place]) 08/30/2016  . Pneumonia 05/19/2016  .  Hyperlipidemia 04/20/2016  . Back pain 04/19/2016  . Type 2 diabetes mellitus treated with insulin ([place]) 04/19/2016  . Essential hypertension 04/19/2016  . PAD (peripheral artery disease) ([place]) 04/19/2016  . Erectile dysfunction following radical prostatectomy 06/25/2015  . History of prostate cancer 12/23/2014  . Incontinence 12/23/2014    Current Facility-Administered Medications on File Prior to Encounter  Medication Dose Route Frequency Provider Last Rate Last Dose  . acetaminophen (TYLENOL) tablet 650 mg  650 mg Oral Q6H PRN Stegmayer, Kimberly A, PA-C       Or  . acetaminophen (TYLENOL) suppository 650 mg  650 mg Rectal Q6H PRN Stegmayer, Kimberly A, PA-C      . ondansetron (ZOFRAN) tablet 4 mg  4 mg Oral Q6H PRN Stegmayer, Kimberly A, PA-C       Or  . ondansetron (ZOFRAN) 4 mg in sodium chloride 0.9 % 50 mL IVPB  4 mg Intravenous Q6H PRN Stegmayer, Janalyn Harder, PA-C       Current Outpatient Medications on File Prior to Encounter  Medication Sig Dispense Refill  . aspirin EC 81 MG tablet Take 81 mg by mouth daily.    Marland Kitchen diltiazem (CARDIZEM CD) 240 MG 24 hr capsule Take 300 mg by mouth daily.     Marland Kitchen docusate sodium (COLACE) 100 MG capsule Take 100 mg by mouth 2 (two) times daily.    . famotidine (PEPCID) 20 MG tablet Take 20 mg by mouth 2 (two) times daily.    . insulin aspart (NOVOLOG) 100 UNIT/ML injection Inject 0-5 Units into the skin 3 (three) times daily with meals.     Marland Kitchen  insulin glargine (LANTUS) 100 UNIT/ML injection Inject 0.1 mLs (10 Units total) into the skin at bedtime. 10 mL 11  . JARDIANCE 10 MG TABS tablet Take 10 mg by mouth daily.     Marland Kitchen lamoTRIgine (LAMICTAL) 25 MG tablet Take 100 mg by mouth 2 (two) times daily.     . meloxicam (MOBIC) 7.5 MG tablet Take 7.5 mg by mouth daily.    . metFORMIN (GLUCOPHAGE) 1000 MG tablet TAKE 1 TABLET TWICE A DAY WITH MEALS    . naproxen sodium (ALEVE) 220 MG tablet Take 220 mg by mouth 2 (two) times daily as needed.    Marland Kitchen  oxyCODONE-acetaminophen (PERCOCET/ROXICET) 5-325 MG tablet Take 1 tablet by mouth every 6 (six) hours as needed for severe pain.    . simethicone (MYLICON) 80 MG chewable tablet Chew 80 mg by mouth every 6 (six) hours as needed for flatulence.    Marland Kitchen atorvastatin (LIPITOR) 80 MG tablet Take 80 mg by mouth daily at 6 PM.     . digoxin (LANOXIN) 0.125 MG tablet Take 1 tablet (0.125 mg total) by mouth daily. (Patient not taking: Reported on 07/22/2017)    . furosemide (LASIX) 20 MG tablet Take 1 tablet (20 mg total) by mouth daily. (Patient not taking: Reported on 10/15/2017) 30 tablet 0  . lubiprostone (AMITIZA) 24 MCG capsule Take by mouth.    . mirabegron ER (MYRBETRIQ) 25 MG TB24 tablet Take 1 tablet (25 mg total) by mouth daily. (Patient not taking: Reported on 10/15/2017) 30 tablet 0  . oxyCODONE (OXY IR/ROXICODONE) 5 MG immediate release tablet Take 1 tablet (5 mg total) by mouth every 6 (six) hours as needed for moderate pain. (Patient not taking: Reported on 07/22/2017) 30 tablet 0  . oxyCODONE (OXYCONTIN) 10 mg 12 hr tablet Take 1 tablet (10 mg total) by mouth every 12 (twelve) hours. (Patient not taking: Reported on 07/22/2017) 14 tablet 0  . potassium chloride SA (K-DUR,KLOR-CON) 20 MEQ tablet Take by mouth.    . Rivaroxaban (XARELTO) 15 MG TABS tablet Take by mouth.    . silver sulfADIAZINE (SILVADENE) 1 % cream Apply topically daily. (Patient not taking: Reported on 10/15/2017) 50 g 0  . TOPROL XL 100 MG 24 hr tablet Take 100 mg by mouth daily.    . TRULICITY 0.27 OZ/3.6UY SOPN 0.75 mg once a week.      Past Medical History:  Diagnosis Date  . Arthritis   . Atrial fibrillation ([place])   . Carcinoma of prostate ([place])   . CHF (congestive heart failure) ([place])   . Chronic kidney disease   . Diabetes mellitus without complication ([place])   . ED (erectile dysfunction)   . Frequent urination   . Hyperlipidemia   . Hypertension   . Moderate mitral insufficiency   . Peripheral vascular disease ([place])    . Pneumonia 04/2016  . Prostate cancer ([place])   . Sleep apnea    OSA--USE C-PAP  . Ulcer of left foot due to type 2 diabetes mellitus ([place])   . Urinary stress incontinence, male     Past Surgical History:  Procedure Laterality Date  . AMPUTATION Left 01/12/2017   Procedure: AMPUTATION BELOW KNEE;  Surgeon: Algernon Huxley, MD;  Location: [facility] ORS;  Service: General;  Laterality: Left;  . APLIGRAFT PLACEMENT Left 10/15/2016   Procedure: APLIGRAFT PLACEMENT;  Surgeon: Albertine Patricia, DPM;  Location: [facility] OR[facility];  Service: Podiatry;  Laterality: Left;  necrotic ulcer  . CHOLECYSTECTOMY    . EYE  SURGERY Bilateral    Cataract Extraction with IOL  . IRRIGATION AND DEBRIDEMENT FOOT Left 10/15/2016   Procedure: IRRIGATION AND DEBRIDEMENT FOOT-EXCISIONAL DEBRIDEMENT OF SKIN 3RD, 4TH AND 5TH TOES WITH APPLICATION OF APLIGRAFT;  Surgeon: Albertine Patricia, DPM;  Location: [facility] OR[facility];  Service: Podiatry;  Laterality: Left;  necrotic,gangrene  . IRRIGATION AND DEBRIDEMENT FOOT Left 12/09/2016   Procedure: IRRIGATION AND DEBRIDEMENT FOOT-MUSCLE/FASCIA, RESECTION OF FOURTH AND FIFTH METATARSAL NECROTIC BONE;  Surgeon: Albertine Patricia, DPM;  Location: [facility] OR[facility];  Service: Podiatry;  Laterality: Left;  . LOWER EXTREMITY ANGIOGRAPHY Left 08/16/2016   Procedure: Lower Extremity Angiography;  Surgeon: Algernon Huxley, MD;  Location: [facility];  Service: Cardiovascular;  Laterality: Left;  . LOWER EXTREMITY ANGIOGRAPHY Left 09/01/2016   Procedure: Lower Extremity Angiography;  Surgeon: Algernon Huxley, MD;  Location: [facility];  Service: Cardiovascular;  Laterality: Left;  . LOWER EXTREMITY ANGIOGRAPHY Left 10/14/2016   Procedure: Lower Extremity Angiography;  Surgeon: Algernon Huxley, MD;  Location: [facility];  Service: Cardiovascular;  Laterality: Left;  . LOWER EXTREMITY ANGIOGRAPHY Left 12/20/2016   Procedure: Lower Extremity Angiography;  Surgeon: Algernon Huxley, MD;  Location: [facility];  Service: Cardiovascular;  Laterality: Left;  . LOWER EXTREMITY INTERVENTION  09/01/2016   Procedure: Lower Extremity Intervention;  Surgeon: Algernon Huxley, MD;  Location: [facility];  Service: Cardiovascular;;  . PROSTATE SURGERY     removal  . TONSILLECTOMY     as a child    Social History   Tobacco Use  . Smoking status: Former Smoker    Packs/day: 0.25    Types: Cigarettes    Last attempt to quit: 10/14/1994    Years since quitting: 23.0  . Smokeless tobacco: Never Used  Substance Use Topics  . Alcohol use: No    Alcohol/week: 0.0 oz  . Drug use: No    Family History  Problem Relation Age of Onset  . Hypertension Mother   . Diabetes Mother   . Prostate cancer Neg Hx   . Bladder Cancer Neg Hx   . Kidney disease Neg Hx     PE: Vitals:   10/20/17 0750 10/20/17 1607 10/20/17 1701 10/20/17 2003  BP: (!) 160/89 134/85  111/77  Pulse: 93 83  90  Resp: 16 20    Temp: (!) 97.5 F (36.4 C) (!) 97.4 F (36.3 C)  97.6 F (36.4 C)  TempSrc: Oral Oral  Oral  SpO2: 95% 99%  96%  Weight:   76.6 kg (168 lb 14 oz)   Height:   5\' 7"  (1.702 m)    Patient appears to be in no acute distress  patient is alert and oriented x3 Atraumatic normocephalic head No cervical or supraclavicular lymphadenopathy appreciated No increased work of breathing, no audible wheezes/rhonchi Regular sinus rhythm/rate Abdomen is soft, nontender, nondistended, no CVA or suprapubic tenderness Urine is clear from the Foley Grossly neurologically intact No identifiable skin lesions  Recent Labs    10/18/17 0632 10/19/17 0659  WBC 9.5 10.2  HGB 14.1 13.5  HCT 41.7 40.4   Recent Labs    10/19/17 0659  NA 136  K 3.3*  CL 106  CO2 22  GLUCOSE 222*  BUN 41*  CREATININE 1.25*  CALCIUM 7.8*   No results for input(s): LABPT, INR in the last 72 hours. No results for input(s): LABURIN in the last 72 hours. Results for orders placed or performed during the  hospital encounter of  10/14/17  MRSA PCR Screening     Status: None   Collection Time: 10/15/17  3:28 AM  Result Value Ref Range Status   MRSA by PCR NEGATIVE NEGATIVE Final    Comment:        The GeneXpert MRSA Assay (FDA approved for NASAL specimens only), is one component of a comprehensive MRSA colonization surveillance program. It is not intended to diagnose MRSA infection nor to guide or monitor treatment for MRSA infections. Performed at [facility], [address], [place], [place] [address]     Imaging: none  Imp: 83 year old white male with post prostatectomy stress incontinence.  He is eager to do something other than tolerate the leakage and wear diapers.  His 3 options at this point would be a penile clamp, an indwelling Foley, or a condom catheter.  Recommendations: The patient did not particularly like the penile clamp.  He seems to like the indwelling Foley catheter.  I explained to him the increased risk of bladder infections with indwelling catheters, I also explained to him the fact that this needed to be changed every 4 weeks.  I suggested that he might consider an alternative option of a condom catheter.  Perhaps, after his procedure tomorrow, the nursing staff can take the Foley out that he has in place a condom catheter.  He can try this and see how it goes.  We will plan to follow-up with the patient has an outpatient clinic 4 weeks from now.   Conn Meckel W

## 2017-10-20 NOTE — Progress Notes (Signed)
Inpatient Diabetes Program Recommendations  AACE/ADA: New Consensus Statement on Inpatient Glycemic Control (2015)  Target Ranges:  Prepandial:   less than 140 mg/dL      Peak postprandial:   less than 180 mg/dL (1-2 hours)      Critically ill patients:  140 - 180 mg/dL   Results for [name], [name] (MRN [id number]) as of 10/20/2017 09:45  Ref. Range 10/19/2017 07:38 10/19/2017 12:04 10/19/2017 17:07 10/19/2017 21:16  Glucose-Capillary Latest Ref Range: 65 - 99 mg/dL 191 (H) 161 (H) 278 (H) 263 (H)   Results for [name], [name] (MRN [id number]) as of 10/20/2017 09:45  Ref. Range 10/20/2017 07:53  Glucose-Capillary Latest Ref Range: 65 - 99 mg/dL 143 (H)    Home DM Meds: Lantus 10 units QHS                             Novolog 0-5 units TID per SSI                             Jardiance 10 mg daily                             Trulicity 3.00 mg Qweek                             Metformin 1000 mg BID    Current Insulin Orders: Lantus 15 units QHS      Novolog Moderate Correction Scale/ SSI (0-15 units) Q4 hours      MD- Please consider the following in-hospital insulin adjustments while home oral DM Meds are on hold:  1. Change Novolog SSI to TID AC + HS (see that CBG timing was adjusted but Novolog SSI still ordered for Q4 hours in [facility])  2. Start Novolog Meal Coverage: Novolog 4 units TID with meals (hold if pt eats <50% of meal) (Use Glycemic Control Order set)     --Will follow patient during hospitalization--  Wyn Quaker RN, MSN, CDE Diabetes Coordinator Inpatient Glycemic Control Team Team Pager: [phone] (8a-5p)

## 2017-10-20 NOTE — Progress Notes (Signed)
[facility]  Daily Progress Note   Subjective  - 2 Days Post-Op  No major events. Right foot and leg about the same.  Pain control adequate  Objective Vitals:   10/19/17 0817 10/19/17 2016 10/20/17 0433 10/20/17 0750  BP: 108/69 106/69 (!) 125/91 (!) 160/89  Pulse: (!) 58 (!) 55 63 93  Resp:  19 18 16   Temp:  97.8 F (36.6 C) (!) 97.5 F (36.4 C) (!) 97.5 F (36.4 C)  TempSrc:  Oral Oral Oral  SpO2: 95% 94% 100% 95%  Weight:      Height:        Intake/Output Summary (Last 24 hours) at 10/20/2017 1328 Last data filed at 10/20/2017 1017 Gross per 24 hour  Intake 120 ml  Output 1850 ml  Net -1730 ml    PULM  CTAB CV  RRR VASC  dark eschar and ulceration on the anterior and medial lower leg area just medial to the tibia.  Healing poorly.  Sluggish capillary refill in the foot.  Left BKA well-healed  Laboratory CBC    Component Value Date/Time   WBC 10.2 10/19/2017 0659   HGB 13.5 10/19/2017 0659   HGB 16.3 01/20/2013 0255   HCT 40.4 10/19/2017 0659   HCT 47.9 01/20/2013 0255   PLT 196 10/19/2017 0659   PLT 245 01/20/2013 0255    BMET    Component Value Date/Time   NA 136 10/19/2017 0659   NA 140 01/20/2013 0255   K 3.3 (L) 10/19/2017 0659   K 3.5 01/20/2013 0255   CL 106 10/19/2017 0659   CL 103 01/20/2013 0255   CO2 22 10/19/2017 0659   CO2 30 01/20/2013 0255   GLUCOSE 222 (H) 10/19/2017 0659   GLUCOSE 145 (H) 01/20/2013 0255   BUN 41 (H) 10/19/2017 0659   BUN 22 (H) 01/20/2013 0255   CREATININE 1.25 (H) 10/19/2017 0659   CREATININE 1.09 01/20/2013 0255   CALCIUM 7.8 (L) 10/19/2017 0659   CALCIUM 9.4 01/20/2013 0255   GFRNONAA 53 (L) 10/19/2017 0659   GFRNONAA >60 01/20/2013 0255   GFRAA >60 10/19/2017 0659   GFRAA >60 01/20/2013 0255    Assessment/Planning:    PAD with ulceration right lower extremity  For right lower extremity angiogram and possible revascularization tomorrow  Risks and benefits are discussed and  the patient is agreeable to proceed.  Left BKA is well-healed without any current issues.  Heart rate and blood pressure control are much better after medical stabilization    Leotis Pain  10/20/2017, 1:28 PM

## 2017-10-20 NOTE — Progress Notes (Signed)
Notified by [facility] patient HR going into 140's, pt has just received his morning medications to include cardizem, metorprolol, and digoxin. Will continue to monitor at this time for anticipation of further interventions

## 2017-10-20 NOTE — Progress Notes (Signed)
Daily Progress Note   Patient Name: [name]       Date: 10/20/2017 DOB: 1934-08-11  Age: 83 y.o. MRN#: [id number] Attending Physician: Vaughan Basta, * Primary Care Physician: Alanson Aly, FNP Admit Date: 10/14/2017  Reason for Consultation/Follow-up: Establishing goals of care  Subjective: Patient resting in bed. He states he is feeling better. He tells me he is having a procedure tomorrow with Dr. Lucky Cowboy for his foot. He states he hopes he will not need the foot amputated, but states he would have the procedure if it were life and death, as many people are able to walk with bilateral prosthetics. Yesterday he stated he would not want to live in a vegetative state, but needed to consider other options regarding [place]. Today, upon speaking about his health and GOC to determine questions or concerns, he states "I don't want to talk about death and bad things", and states that his son would make decisions if he were unable to.    Length of Stay: 5  Current Medications: Scheduled Meds:  . aspirin  81 mg Oral Daily  . atorvastatin  80 mg Oral q1800  . collagenase   Topical Daily  . digoxin  0.125 mg Oral Daily  . diltiazem  300 mg Oral Daily  . docusate sodium  100 mg Oral Daily  . famotidine  20 mg Oral BID  . furosemide  40 mg Intravenous Daily  . insulin aspart  0-15 Units Subcutaneous Q4H  . insulin glargine  15 Units Subcutaneous QHS  . lamoTRIgine  100 mg Oral BID  . mouth rinse  15 mL Mouth Rinse BID  . metoprolol succinate  100 mg Oral Daily  . potassium chloride  20 mEq Oral BID  . risperiDONE  0.5 mg Oral QHS  . rivaroxaban  20 mg Oral Q supper  . ziprasidone  10 mg Intramuscular Once    Continuous Infusions:   PRN Meds: acetaminophen, bisacodyl,  dextrose, metoprolol tartrate, ondansetron (ZOFRAN) IV, senna-docusate  Physical Exam  Constitutional: No distress.  Pulmonary/Chest: Effort normal.  Neurological: He is alert.            Vital Signs: BP (!) 160/89 (BP Location: Left Arm)   Pulse 93   Temp (!) 97.5 F (36.4 C) (Oral)   Resp 16   Ht 5'  7" (1.702 m)   Wt 78.5 kg (173 lb)   SpO2 95%   BMI 27.10 kg/m  SpO2: SpO2: 95 % O2 Device: O2 Device: Room Air O2 Flow Rate: O2 Flow Rate (L/min): 2 L/min  Intake/output summary:   Intake/Output Summary (Last 24 hours) at 10/20/2017 1306 Last data filed at 10/20/2017 1017 Gross per 24 hour  Intake 120 ml  Output 1850 ml  Net -1730 ml   LBM: Last BM Date: (unknown on transfer from CCU) Baseline Weight: Weight: 81.6 kg (180 lb) Most recent weight: Weight: 78.5 kg (173 lb)       Palliative Assessment/Data: 50%    Flowsheet Rows     Most Recent Value  Intake Tab  Referral Department  Hospitalist  Unit at Time of Referral  Med/Surg Unit  Palliative Care Primary Diagnosis  Cardiac  Date Notified  10/19/17  Palliative Care Type  New Palliative care  Reason for referral  Clarify Goals of Care  Date of Admission  10/14/17  Date first seen by Palliative Care  10/19/17  # of days Palliative referral response time  0 Day(s)  # of days IP prior to Palliative referral  5  Clinical Assessment  Psychosocial & Spiritual Assessment  Palliative Care Outcomes      Patient Active Problem List   Diagnosis Date Noted  . Atrial fibrillation with rapid ventricular response ([place]) 10/15/2017  . BKA stump complication ([place]) 75/64/3329  . 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 S/P BKA (below knee amputation) unilateral, left ([place]) 02/01/2017  . Chronic diastolic heart failure ([place]) 01/21/2017  . Pressure injury of skin 01/18/2017  . 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 Diabetic foot ulcer ([place]) 12/29/2016  .  Ataxia 10/21/2016  . History of femoral angiogram 09/06/2016  . Left leg pain 09/06/2016  . Leukocytosis 09/06/2016  . Generalized weakness 09/06/2016  . A-fib ([place]) 08/30/2016  . Pneumonia 05/19/2016  . Hyperlipidemia 04/20/2016  . Back pain 04/19/2016  . Type 2 diabetes mellitus treated with insulin ([place]) 04/19/2016  . Essential hypertension 04/19/2016  . PAD (peripheral artery disease) ([place]) 04/19/2016  . Erectile dysfunction following radical prostatectomy 06/25/2015  . History of prostate cancer 12/23/2014  . Incontinence 12/23/2014    Palliative Care Assessment & Plan   Patient Profile: [name] a29 y.o.malewith a known history of AF (Xarelto), DM, PVD, T2IDDM, AF + RVR. Pt w/ L BKA.   Assessment/ Recommendations/Plan: Recommend outpatient palliative to follow.     Code Status:    Code Status Orders  (From admission, onward)        Start     Ordered   10/15/17 0310  Full code  Continuous     10/15/17 0309    Code Status History    Date Active Date Inactive Code Status Order ID Comments User Context   02/10/2017 1904 02/12/2017 1908 Full Code [id number]  Katha Cabal, MD ED   02/10/2017 1638 02/10/2017 1904 Full Code [id number]  Sela Hua, PA-C Outpatient   01/08/2017 1342 01/19/2017 2103 Full Code [id number]  Loletha Grayer, MD ED   12/29/2016 2232 12/30/2016 1634 Full Code [id number]  Lance Coon, MD ED   12/20/2016 1251 12/20/2016 1842 Full Code [id number]  Algernon Huxley, MD Inpatient   10/22/2016 0040 10/24/2016 2058 Full Code [id number]  Lance Coon, MD Inpatient   10/14/2016 1201 10/14/2016 1925 Full Code [id number]  Algernon Huxley, MD Inpatient   08/30/2016  1338 09/06/2016 1941 Full Code [id number]  Epifanio Lesches, MD Inpatient   05/19/2016 0454 05/22/2016 1907 Full Code [id number]  Saundra Shelling, MD Inpatient      Prognosis: Guarded  Discharge Planning: To Be Determined   Thank you for allowing the Palliative Medicine Team to assist  in the care of this patient.   Total Time 35 min Prolonged Time Billed  no       Greater than 50%  of this time was spent counseling and coordinating care related to the above assessment and plan.  Asencion Gowda, NP  Please contact Palliative Medicine Team phone at [phone] for questions and concerns.

## 2017-10-21 ENCOUNTER — Encounter
Admission: EM | Disposition: A | Discharge status: Skilled Nursing Facility | Payer: Self-pay | Source: Home / Self Care | Attending: Internal Medicine

## 2017-10-21 ENCOUNTER — Ambulatory Visit: Payer: Medicare Other | Admitting: Urology

## 2017-10-21 ENCOUNTER — Encounter: Payer: Self-pay | Admitting: Anesthesiology

## 2017-10-21 ENCOUNTER — Telehealth: Payer: Self-pay | Admitting: Urology

## 2017-10-21 LAB — BASIC METABOLIC PANEL
ANION GAP: 9 (ref 5–15)
BUN: 26 mg/dL — AB (ref 6–20)
CO2: 26 mmol/L (ref 22–32)
Calcium: 8.2 mg/dL — ABNORMAL LOW (ref 8.9–10.3)
Chloride: 102 mmol/L (ref 101–111)
Creatinine, Ser: 0.92 mg/dL (ref 0.61–1.24)
GFR calc Af Amer: >60 mL/min (ref >60)
GFR calc non Af Amer: >60 mL/min (ref >60)
Glucose, Bld: 145 mg/dL — ABNORMAL HIGH (ref 65–99)
POTASSIUM: 3.8 mmol/L (ref 3.5–5.1)
Sodium: 137 mmol/L (ref 135–145)

## 2017-10-21 LAB — GLUCOSE, CAPILLARY
GLUCOSE-CAPILLARY: 123 mg/dL — AB (ref 65–99)
Glucose-Capillary: 157 mg/dL — ABNORMAL HIGH (ref 65–99)
Glucose-Capillary: 91 mg/dL (ref 65–99)
Glucose-Capillary: 140 mg/dL — ABNORMAL HIGH (ref 65–99)
Glucose-Capillary: 211 mg/dL — ABNORMAL HIGH (ref 65–99)

## 2017-10-21 SURGERY — LOWER EXTREMITY ANGIOGRAPHY
Anesthesia: General

## 2017-10-21 SURGERY — LOWER EXTREMITY ANGIOGRAPHY
Anesthesia: Moderate Sedation | Laterality: Right

## 2017-10-21 MED ORDER — MIDAZOLAM HCL 2 MG/ML PO SYRP
ORAL_SOLUTION | ORAL | Status: AC
  Filled 2017-10-21: qty 4

## 2017-10-21 MED ORDER — MIDAZOLAM HCL 2 MG/2ML IJ SOLN
1.0000 mg | Freq: Once | INTRAMUSCULAR | Status: AC
  Administered 2017-10-21: 1 mg via INTRAVENOUS
  Filled 2017-10-21: qty 1

## 2017-10-21 MED ORDER — SODIUM CHLORIDE 0.9 % IV SOLN
INTRAVENOUS | Status: DC
  Administered 2017-10-21: 08:00:00 via INTRAVENOUS

## 2017-10-21 MED ORDER — MIDAZOLAM HCL 2 MG/2ML IJ SOLN
INTRAMUSCULAR | Status: AC
Start: 2017-10-21 — End: 2017-10-21
  Administered 2017-10-21: 1 mg via INTRAVENOUS
  Filled 2017-10-21: qty 2

## 2017-10-21 MED ORDER — FAMOTIDINE 20 MG PO TABS
40.0000 mg | ORAL_TABLET | Freq: Once | ORAL | Status: AC
  Administered 2017-10-21: 40 mg via ORAL
  Filled 2017-10-21: qty 2

## 2017-10-21 MED ORDER — DIPHENHYDRAMINE HCL 50 MG/ML IJ SOLN
25.0000 mg | Freq: Once | INTRAMUSCULAR | Status: AC
  Administered 2017-10-21: 25 mg via INTRAVENOUS
  Filled 2017-10-21: qty 1

## 2017-10-21 MED ORDER — METHYLPREDNISOLONE SODIUM SUCC 125 MG IJ SOLR
125.0000 mg | Freq: Once | INTRAMUSCULAR | Status: AC
  Administered 2017-10-21: 125 mg via INTRAVENOUS
  Filled 2017-10-21: qty 2

## 2017-10-21 MED ORDER — CEFAZOLIN SODIUM-DEXTROSE 2-4 GM/100ML-% IV SOLN
2.0000 g | INTRAVENOUS | Status: DC
  Filled 2017-10-21: qty 100

## 2017-10-21 MED ORDER — HALOPERIDOL LACTATE 5 MG/ML IJ SOLN
0.5000 mg | Freq: Four times a day (QID) | INTRAMUSCULAR | Status: DC | PRN
  Administered 2017-10-21 – 2017-10-23 (×4): 0.5 mg via INTRAVENOUS
  Filled 2017-10-21 (×4): qty 1
  Filled 2017-10-21: qty 0.1

## 2017-10-21 MED ORDER — MIDAZOLAM HCL 2 MG/ML PO SYRP: 8.0000 mg | ORAL_SOLUTION | Freq: Once | ORAL | Status: DC

## 2017-10-21 NOTE — OR Nursing (Signed)
Skin tear noted right forearm after pt tried climbing out of bed on right side, site wrapped in gauze dressing.

## 2017-10-21 NOTE — Care Management (Signed)
Patient is for angiogram on right leg due to chronic ulcer. Prosthesis has been brought to hospital to use with physical therapy evaluation.  Therapist made attempt to see today but deferred until after angiogram

## 2017-10-21 NOTE — Progress Notes (Signed)
Patient is very agitated and combative at this time , trying to get out of bed , MD at bedside , order 1:1 sitter , will continue to monitor .

## 2017-10-21 NOTE — Care Management Important Message (Signed)
Copy of signed IM left in patient's room.    

## 2017-10-21 NOTE — H&P (Signed)
[facility] History & Physical Update  The patient was interviewed and re-examined.  The patient's previous History and Physical has been reviewed and is unchanged.  There is no change in the plan of care. We plan to proceed with the scheduled procedure.  Leotis Pain, MD  10/21/2017, 2:22 PM

## 2017-10-21 NOTE — OR Nursing (Addendum)
Patient came to [facility] from inpatient unit confused and agitated. Order from Dr Lucky Cowboy for PO versed, pt combative and unwilling to take, order changed to IV versed with slight improvement in agitation. Vital Signs stable. Patient remained moderately agitated, Dew and anesthesia at bedside evaluating patient and discussing plan of care with patient's wife.  Procedure cancelled per discussion with Dew, [place], and patient's wife. Notified 2A nurse Abigail, returned pt to inpatient room.

## 2017-10-21 NOTE — Plan of Care (Signed)

## 2017-10-21 NOTE — Progress Notes (Addendum)
[facility] [place] NAME: [name]    MR#:  [id number]  DATE OF BIRTH:  [birth year]  SUBJECTIVE:  CHIEF COMPLAINT:   Chief Complaint  Patient presents with  . Tachycardia  . Hyperglycemia    Came with hypoglycemia, atrial fibrillation and shortness of breath   Required bipap in ICU, stable now, on floor.    Cardiology decided medical management for his CAD   Vascular has planned angiogram for RLE.  REVIEW OF SYSTEMS:  CONSTITUTIONAL: No fever, fatigue or weakness.  EYES: No blurred or double vision.  EARS, NOSE, AND THROAT: No tinnitus or ear pain.  RESPIRATORY: No cough, no shortness of breath,no wheezing or hemoptysis.  CARDIOVASCULAR: No chest pain, orthopnea,have edema.  GASTROINTESTINAL: No nausea, vomiting, diarrhea or abdominal pain.  GENITOURINARY: No dysuria, hematuria.  ENDOCRINE: No polyuria, nocturia,  HEMATOLOGY: No anemia, easy bruising or bleeding SKIN: No rash or lesion. MUSCULOSKELETAL: No joint pain or arthritis.   NEUROLOGIC: No tingling, numbness, weakness.  PSYCHIATRY: No anxiety or depression.   ROS  DRUG ALLERGIES:   Allergies  Allergen Reactions  . Contrast Media [Iodinated Diagnostic Agents] Shortness Of Breath    SOB  . Iohexol Shortness Of Breath     Desc: Respiratory Distress, Laryngedema, diaphoresis, Onset Date: [id number]   . Metrizamide Shortness Of Breath    SOB SOB SOB   . Latex Rash    VITALS:  Blood pressure (!) 140/95, pulse 70, temperature 98.1 F (36.7 C), temperature source Oral, resp. rate 18, height 5\' 7"  (1.702 m), weight 76.4 kg (168 lb 8 oz), SpO2 95 %.  PHYSICAL EXAMINATION:  GENERAL:  83 y.o.-year-old patient lying in the bed with  acute respiratory distress.  EYES: Pupils equal, round, reactive to light and accommodation. No scleral icterus. Extraocular muscles intact.  HEENT: Head atraumatic, normocephalic. Oropharynx and nasopharynx clear.  NECK:  Supple, no  jugular venous distention. No thyroid enlargement, no tenderness.  LUNGS: Normal breath sounds bilaterally, no wheezing,bilateral crepitation. No use of accessory muscles of respiration. Off bipap. CARDIOVASCULAR: S1, S2 normal. No murmurs, rubs, or gallops.  ABDOMEN: Soft, nontender, nondistended. Bowel sounds present. No organomegaly or mass.  EXTREMITIES: No pedal edema, cyanosis, or clubbing. Left-sided below knee amputation.   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 NEUROLOGIC: Cranial nerves II through XII are intact. Muscle strength 3-4/5 in all extremities. Sensation intact. Gait not checked.  PSYCHIATRIC: The patient is alert and oriented x 3.  SKIN: No obvious rash, lesion, or ulcer.   Physical Exam LABORATORY PANEL:   CBC Recent Labs  Lab 10/19/17 0659  WBC 10.2  HGB 13.5  HCT 40.4  PLT 196   ------------------------------------------------------------------------------------------------------------------  Chemistries  Recent Labs  Lab 10/15/17 0008  10/20/17 1133 10/21/17 0627  NA 134*   < >  --  137  K 4.7   < >  --  3.8  CL 99*   < >  --  102  CO2 19*   < >  --  26  GLUCOSE 686*   < >  --  145*  BUN 28*   < >  --  26*  CREATININE 1.29*   < >  --  0.92  CALCIUM 8.9   < >  --  8.2*  MG 1.9   < > 1.9  --   AST 33  --   --   --  ALT 26  --   --   --   ALKPHOS 195*  --   --   --   BILITOT 1.1  --   --   --    < > = values in this interval not displayed.   ------------------------------------------------------------------------------------------------------------------  Cardiac Enzymes Recent Labs  Lab 10/16/17 2013 10/17/17 0245  TROPONINI 7.08* 5.41*   ------------------------------------------------------------------------------------------------------------------  RADIOLOGY:  No results found.  ASSESSMENT AND PLAN:   Active Problems:   Atrial fibrillation  with rapid ventricular response (HCC)  * non-ST elevation MI   Significant rise in his troponin, was on heparin IV drip and further management per cardiology.   On ASA, betablocker, atorvastatin.   plan for cath, but cardiology suggest to manage medically due to his being high risk.  * hyperglycemia- nonketotic   Responded to IV insulin drip, switch to long-acting insulin with sliding scale control.   stable now.   * atrial fibrillation   Likely secondary to stress of hyperglycemia and dehydration.   Currently on digoxin, Cardizem and metoprolol oral.   Heparin IV drip. Stopped now   On xarelto   Had tachycardia- spoke to cardiologist, he is already on high dose cardizema nd metoprolol, suggest to cont the same.  * acute systolic congestive heart failure   This is secondary to atrial fibrillation.   IV Lasix and BiPAP as needed.   Significant drop in EF, compared to past.   Cont lasix.  * right leg wound   Wound care team.   Appreciated help by podiatry and vascular- plan for angiogram tomorrow.  * hypokalemia    Replace oral.  Prognosis very poor due to multiple medical issues- called palliative care.   Was able to walk with a prosthesis at home before admission, told his wife to bring prosthesis and get PT eval with that, as now he have overall improvement in condition.   All the records are reviewed and case discussed with Care Management/Social Workerr. Management plans discussed with the patient, family and they are in agreement.  CODE STATUS: Full.  TOTAL TIME TAKING CARE OF THIS PATIENT: 35 minutes.    POSSIBLE D/C IN 1-2 DAYS, DEPENDING ON CLINICAL CONDITION.   Vaughan Basta M.D on 10/21/2017   Between 7am to 6pm - Pager - [phone]  After 6pm go to [email or web address] - password [email or web address] [facility]  Office  [phone]  CC: Primary care physician; Alanson Aly, FNP  Note: This dictation was prepared with Dragon dictation  along with smaller phrase technology. Any transcriptional errors that result from this process are unintentional.

## 2017-10-21 NOTE — Progress Notes (Signed)
Talked to Dr. Posey Pronto, Gus Height about patient's being combative again, he received Haldol at 1550, order to give haldol now. RN will continue to monitor.

## 2017-10-21 NOTE — Progress Notes (Signed)
PT Cancellation Note  Patient Details Name: [name] MRN: [id number] DOB: 1934-10-20   Cancelled Treatment:    Reason Eval/Treat Not Completed: Other (comment).Consult received and chart reviewed. Pt pending R LE angiogram with possible revascularization this date. Pt has L BKA. Will hold at this time.   Debbrah Sampedro 10/21/2017, 8:37 AM  Greggory Stallion, PT, DPT [phone]

## 2017-10-21 NOTE — Telephone Encounter (Signed)
Patient already has a follow up app  [name]

## 2017-10-22 LAB — GLUCOSE, CAPILLARY
GLUCOSE-CAPILLARY: 207 mg/dL — AB (ref 65–99)
GLUCOSE-CAPILLARY: 219 mg/dL — AB (ref 65–99)
GLUCOSE-CAPILLARY: 437 mg/dL — AB (ref 65–99)
GLUCOSE-CAPILLARY: 85 mg/dL (ref 65–99)
Glucose-Capillary: 149 mg/dL — ABNORMAL HIGH (ref 65–99)

## 2017-10-22 LAB — URINALYSIS, COMPLETE (UACMP) WITH MICROSCOPIC
Bilirubin Urine: NEGATIVE
Glucose, UA: >=500 mg/dL — AB
Ketones, ur: NEGATIVE mg/dL
Leukocytes, UA: NEGATIVE
NITRITE: NEGATIVE
PROTEIN: NEGATIVE mg/dL
RBC / HPF: >50 RBC/hpf — ABNORMAL HIGH (ref 0–5)
Specific Gravity, Urine: 1.011 (ref 1.005–1.030)
pH: 5 (ref 5.0–8.0)

## 2017-10-22 LAB — AMMONIA

## 2017-10-22 MED ORDER — LORAZEPAM 2 MG/ML IJ SOLN
1.0000 mg | Freq: Four times a day (QID) | INTRAMUSCULAR | Status: DC | PRN
  Administered 2017-10-22 – 2017-10-26 (×7): 1 mg via INTRAVENOUS
  Filled 2017-10-22 (×7): qty 1

## 2017-10-22 NOTE — Progress Notes (Signed)
[facility] at [place] NAME: [name]    MR#:  [id number]  DATE OF BIRTH:  06-20-1934  SUBJECTIVE:  CHIEF COMPLAINT:   Chief Complaint  Patient presents with  . Tachycardia  . Hyperglycemia    Came with hypoglycemia, atrial fibrillation and shortness of breath   Required bipap in ICU, stable now, on floor.    Cardiology decided medical management for his CAD   Vascular has planned angiogram for RLE.   pt was very agitated and they did not do angiogram.  REVIEW OF SYSTEMS:   CONSTITUTIONAL: No fever, fatigue or weakness.  EYES: No blurred or double vision.  EARS, NOSE, AND THROAT: No tinnitus or ear pain.  RESPIRATORY: No cough, no shortness of breath,no wheezing or hemoptysis.  CARDIOVASCULAR: No chest pain, orthopnea,have edema.  GASTROINTESTINAL: No nausea, vomiting, diarrhea or abdominal pain.  GENITOURINARY: No dysuria, hematuria.  ENDOCRINE: No polyuria, nocturia,  HEMATOLOGY: No anemia, easy bruising or bleeding SKIN: No rash or lesion. MUSCULOSKELETAL: No joint pain or arthritis.   NEUROLOGIC: No tingling, numbness, weakness.  PSYCHIATRY: No anxiety or depression.   ROS  DRUG ALLERGIES:   Allergies  Allergen Reactions  . Contrast Media [Iodinated Diagnostic Agents] Shortness Of Breath    SOB  . Iohexol Shortness Of Breath     Desc: Respiratory Distress, Laryngedema, diaphoresis, Onset Date: [id number]   . Metrizamide Shortness Of Breath    SOB SOB SOB   . Latex Rash    VITALS:  Blood pressure (!) 139/95, pulse 84, temperature 98.5 F (36.9 C), resp. rate 16, height 5\' 7"  (1.702 m), weight 76.4 kg (168 lb 8 oz), SpO2 97 %.  PHYSICAL EXAMINATION:  GENERAL:  83 y.o.-year-old patient lying in the bed with  acute respiratory distress.  EYES: Pupils equal, round, reactive to light and accommodation. No scleral icterus. Extraocular muscles intact.  HEENT: Head atraumatic, normocephalic. Oropharynx and  nasopharynx clear.  NECK:  Supple, no jugular venous distention. No thyroid enlargement, no tenderness.  LUNGS: Normal breath sounds bilaterally, no wheezing,bilateral crepitation. No use of accessory muscles of respiration. Off bipap. CARDIOVASCULAR: S1, S2 normal. No murmurs, rubs, or gallops.  ABDOMEN: Soft, nontender, nondistended. Bowel sounds present. No organomegaly or mass.  EXTREMITIES: No pedal edema, cyanosis, or clubbing. Left-sided below knee amputation.   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 NEUROLOGIC: Cranial nerves II through XII are intact. Muscle strength 3-4/5 in all extremities. Sensation intact. Gait not checked.  PSYCHIATRIC: The patient is alert and oriented x 1. Trying to come out of the bed. SKIN: No obvious rash, lesion, or ulcer.   Physical Exam LABORATORY PANEL:   CBC Recent Labs  Lab 10/19/17 0659  WBC 10.2  HGB 13.5  HCT 40.4  PLT 196   ------------------------------------------------------------------------------------------------------------------  Chemistries  Recent Labs  Lab 10/20/17 1133 10/21/17 0627  NA  --  137  K  --  3.8  CL  --  102  CO2  --  26  GLUCOSE  --  145*  BUN  --  26*  CREATININE  --  0.92  CALCIUM  --  8.2*  MG 1.9  --    ------------------------------------------------------------------------------------------------------------------  Cardiac Enzymes Recent Labs  Lab 10/16/17 2013 10/17/17 0245  TROPONINI 7.08* 5.41*   ------------------------------------------------------------------------------------------------------------------  RADIOLOGY:  No results found.  ASSESSMENT AND PLAN:   Active Problems:  Atrial fibrillation with rapid ventricular response (HCC)  * non-ST elevation MI   Significant rise in his troponin, was on heparin IV drip and further management per cardiology.   On ASA, betablocker,  atorvastatin.   plan for cath, but cardiology suggest to manage medically due to his being high risk.  * hyperglycemia- nonketotic   Responded to IV insulin drip, switch to long-acting insulin with sliding scale control.   stable now.   * atrial fibrillation   Likely secondary to stress of hyperglycemia and dehydration.   Currently on digoxin, Cardizem and metoprolol oral.   Heparin IV drip. Stopped now   On xarelto   Had tachycardia- spoke to cardiologist, he is already on high dose cardizema nd metoprolol, suggest to cont the same.  * Altered mental status   Haldol and keep sitter in room.   UA was negative.  * acute systolic congestive heart failure   This is secondary to atrial fibrillation.   IV Lasix and BiPAP as needed.   Significant drop in EF, compared to past.   Cont lasix.  * right leg wound   Wound care team.   Appreciated help by podiatry and vascular- plan for angiogram but could not be done due to agitation.  * hypokalemia    Replace oral.  Prognosis very poor due to multiple medical issues- called palliative care.   Was able to walk with a prosthesis at home before admission, told his wife to bring prosthesis and get PT eval with that, as now he have overall improvement in condition.   All the records are reviewed and case discussed with Care Management/Social Workerr. Management plans discussed with the patient, family and they are in agreement.  CODE STATUS: Full.  TOTAL TIME TAKING CARE OF THIS PATIENT: 35 minutes.    POSSIBLE D/C IN 1-2 DAYS, DEPENDING ON CLINICAL CONDITION.   Vaughan Basta M.D on 10/22/2017   Between 7am to 6pm - Pager - [phone]  After 6pm go to [email or web address] - password [email or web address] [facility]  Office  [phone]  CC: Primary care physician; Alanson Aly, FNP  Note: This dictation was prepared with Dragon dictation along with smaller phrase technology. Any transcriptional errors that  result from this process are unintentional.

## 2017-10-22 NOTE — Progress Notes (Signed)
Talked to Dr. Anselm Jungling about patient being combative, aggressive and confuse again, will give haldol. Amonia level is to check tomorrow, order to check now. Patient also refusing all oral medication and intake. Place seizure pads and mitts for patient for safety as he is punching the side rails. No other concern at the moment. RN will continue to monitor.

## 2017-10-22 NOTE — Progress Notes (Signed)
[facility] at [place] NAME: [name]    MR#:  [id number]  DATE OF BIRTH:  October 26, 1934  SUBJECTIVE:  CHIEF COMPLAINT:   Chief Complaint  Patient presents with  . Tachycardia  . Hyperglycemia    Came with hypoglycemia, atrial fibrillation and shortness of breath   Required bipap in ICU, stable now, on floor.    Cardiology decided medical management for his CAD   Vascular has planned angiogram for RLE.   pt was very agitated and they did not do angiogram.  REVIEW OF SYSTEMS:   CONSTITUTIONAL: No fever, fatigue or weakness.  EYES: No blurred or double vision.  EARS, NOSE, AND THROAT: No tinnitus or ear pain.  RESPIRATORY: No cough, no shortness of breath,no wheezing or hemoptysis.  CARDIOVASCULAR: No chest pain, orthopnea,have edema.  GASTROINTESTINAL: No nausea, vomiting, diarrhea or abdominal pain.  GENITOURINARY: No dysuria, hematuria.  ENDOCRINE: No polyuria, nocturia,  HEMATOLOGY: No anemia, easy bruising or bleeding SKIN: No rash or lesion. MUSCULOSKELETAL: No joint pain or arthritis.   NEUROLOGIC: No tingling, numbness, weakness.  PSYCHIATRY: No anxiety or depression.   ROS  DRUG ALLERGIES:   Allergies  Allergen Reactions  . Contrast Media [Iodinated Diagnostic Agents] Shortness Of Breath    SOB  . Iohexol Shortness Of Breath     Desc: Respiratory Distress, Laryngedema, diaphoresis, Onset Date: [id number]   . Metrizamide Shortness Of Breath    SOB SOB SOB   . Latex Rash    VITALS:  Blood pressure (!) 139/95, pulse 84, temperature 98.5 F (36.9 C), resp. rate 16, height 5\' 7"  (1.702 m), weight 76.4 kg (168 lb 8 oz), SpO2 97 %.  PHYSICAL EXAMINATION:  GENERAL:  83 y.o.-year-old patient lying in the bed with  acute respiratory distress.  EYES: Pupils equal, round, reactive to light and accommodation. No scleral icterus. Extraocular muscles intact.  HEENT: Head atraumatic, normocephalic. Oropharynx and  nasopharynx clear.  NECK:  Supple, no jugular venous distention. No thyroid enlargement, no tenderness.  LUNGS: Normal breath sounds bilaterally, no wheezing,bilateral crepitation. No use of accessory muscles of respiration. Off bipap. CARDIOVASCULAR: S1, S2 normal. No murmurs, rubs, or gallops.  ABDOMEN: Soft, nontender, nondistended. Bowel sounds present. No organomegaly or mass.  EXTREMITIES: No pedal edema, cyanosis, or clubbing. Left-sided below knee amputation.   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 NEUROLOGIC: Cranial nerves II through XII are intact. Muscle strength 3-4/5 in all extremities. Sensation intact. Gait not checked.  PSYCHIATRIC: The patient is alert and oriented x 1. Trying to come out of the bed. SKIN: No obvious rash, lesion, or ulcer.   Physical Exam LABORATORY PANEL:   CBC Recent Labs  Lab 10/19/17 0659  WBC 10.2  HGB 13.5  HCT 40.4  PLT 196   ------------------------------------------------------------------------------------------------------------------  Chemistries  Recent Labs  Lab 10/20/17 1133 10/21/17 0627  NA  --  137  K  --  3.8  CL  --  102  CO2  --  26  GLUCOSE  --  145*  BUN  --  26*  CREATININE  --  0.92  CALCIUM  --  8.2*  MG 1.9  --    ------------------------------------------------------------------------------------------------------------------  Cardiac Enzymes Recent Labs  Lab 10/16/17 2013 10/17/17 0245  TROPONINI 7.08* 5.41*   ------------------------------------------------------------------------------------------------------------------  RADIOLOGY:  No results found.  ASSESSMENT AND PLAN:   Active Problems:  Atrial fibrillation with rapid ventricular response (HCC)  * non-ST elevation MI   Significant rise in his troponin, was on heparin IV drip and further management per cardiology.   On ASA, betablocker,  atorvastatin.   plan for cath, but cardiology suggest to manage medically due to his being high risk.  * hyperglycemia- nonketotic   Responded to IV insulin drip, switch to long-acting insulin with sliding scale control.   stable now.   * atrial fibrillation   Likely secondary to stress of hyperglycemia and dehydration.   Currently on digoxin, Cardizem and metoprolol oral.   Heparin IV drip. Stopped now   On xarelto   Had tachycardia- spoke to cardiologist, he is already on high dose cardizema nd metoprolol, suggest to cont the same.  * Altered mental status   Haldol and keep sitter in room.   UA was negative.   Will check Ammonia and CMP.   Check UA.  * acute systolic congestive heart failure   This is secondary to atrial fibrillation.   IV Lasix and BiPAP as needed.   Significant drop in EF, compared to past.   Cont lasix.  * right leg wound   Wound care team.   Appreciated help by podiatry and vascular- plan for angiogram but could not be done due to agitation.  * hypokalemia    Replace oral.  Prognosis very poor due to multiple medical issues- called palliative care.   Was able to walk with a prosthesis at home before admission, told his wife to bring prosthesis and get PT eval with that, as now he have overall improvement in condition.   All the records are reviewed and case discussed with Care Management/Social Workerr. Management plans discussed with the patient, family and they are in agreement.  CODE STATUS: Full.  TOTAL TIME TAKING CARE OF THIS PATIENT: 35 minutes.    POSSIBLE D/C IN 1-2 DAYS, DEPENDING ON CLINICAL CONDITION.   Vaughan Basta M.D on 10/22/2017   Between 7am to 6pm - Pager - [phone]  After 6pm go to [email or web address] - password [email or web address] [facility]  Office  [phone]  CC: Primary care physician; Alanson Aly, FNP  Note: This dictation was prepared with Dragon dictation along with smaller phrase  technology. Any transcriptional errors that result from this process are unintentional.

## 2017-10-22 NOTE — Progress Notes (Signed)
Talked to Dr. Jannifer Franklin about patient's blood sugar being 33 tonight, patient refused to have oral intake, and missed his dinner. Patient is to received 15 units of Lantus, order to hold. No other concern at the moment. RN will continue to monitor.

## 2017-10-22 NOTE — Progress Notes (Signed)
[facility]  Daily Progress Note   Subjective  - Angio cancelled yesterday d/t AMS.  Patient remains confused/combative.  Objective Vitals:   10/21/17 1714 10/21/17 2025 10/22/17 0328 10/22/17 0808  BP: 115/74 (!) 153/89 134/80 (!) 139/95  Pulse: 88 (!) 110 78 84  Resp: 18 19 19 16   Temp: 97.7 F (36.5 C) 97.7 F (36.5 C) 98.3 F (36.8 C) 98.5 F (36.9 C)  TempSrc:  Oral Oral   SpO2: 97%  94% 97%  Weight:      Height:        Intake/Output Summary (Last 24 hours) at 10/22/2017 1147 Last data filed at 10/22/2017 0701 Gross per 24 hour  Intake -  Output 2900 ml  Net -2900 ml    PULM  CTAB CV  RRR VASC  dark eschar and ulceration on the anterior and medial lower leg area just medial to the tibia.  Healing poorly.  Sluggish capillary refill in the foot.  Left BKA well-healed    Laboratory CBC    Component Value Date/Time   WBC 10.2 10/19/2017 0659   HGB 13.5 10/19/2017 0659   HGB 16.3 01/20/2013 0255   HCT 40.4 10/19/2017 0659   HCT 47.9 01/20/2013 0255   PLT 196 10/19/2017 0659   PLT 245 01/20/2013 0255    BMET    Component Value Date/Time   NA 137 10/21/2017 0627   NA 140 01/20/2013 0255   K 3.8 10/21/2017 0627   K 3.5 01/20/2013 0255   CL 102 10/21/2017 0627   CL 103 01/20/2013 0255   CO2 26 10/21/2017 0627   CO2 30 01/20/2013 0255   GLUCOSE 145 (H) 10/21/2017 0627   GLUCOSE 145 (H) 01/20/2013 0255   BUN 26 (H) 10/21/2017 0627   BUN 22 (H) 01/20/2013 0255   CREATININE 0.92 10/21/2017 0627   CREATININE 1.09 01/20/2013 0255   CALCIUM 8.2 (L) 10/21/2017 0627   CALCIUM 9.4 01/20/2013 0255   GFRNONAA >60 10/21/2017 0627   GFRNONAA >60 01/20/2013 0255   GFRAA >60 10/21/2017 0627   GFRAA >60 01/20/2013 0255    Assessment/Planning:  PAD with ulceration right lower extremity  For right lower extremity angiogram and possible revascularization next week once medically optimized and AMS resolved    [name]  [name]  10/22/2017, 11:47 AM

## 2017-10-22 NOTE — Progress Notes (Signed)
PT Cancellation Note  Patient Details Name: DAVIER TRAMELL MRN: [id number] DOB: [birth year]   Cancelled Treatment:    Reason Eval/Treat Not Completed: Other (comment).  Confusion with combative behavior and has been unable to have his procedure done and so will wait to see for PT until he is more controlled with behavior.   Ramond Dial 10/22/2017, 1:44 PM   Mee Hives, PT MS Acute Rehab Dept. Number: [place] [facility] [place]

## 2017-10-23 ENCOUNTER — Inpatient Hospital Stay: Payer: Medicare Other

## 2017-10-23 DIAGNOSIS — G9340 Encephalopathy, unspecified: Secondary | ICD-10-CM

## 2017-10-23 LAB — CBC
HCT: 44.1 % (ref 40.0–52.0)
HEMOGLOBIN: 14.7 g/dL (ref 13.0–18.0)
MCH: 27.7 pg (ref 26.0–34.0)
MCHC: 33.4 g/dL (ref 32.0–36.0)
MCV: 82.8 fL (ref 80.0–100.0)
Platelets: 235 10*3/uL (ref 150–440)
RBC: 5.32 MIL/uL (ref 4.40–5.90)
RDW: 18.7 % — ABNORMAL HIGH (ref 11.5–14.5)
WBC: 14.2 10*3/uL — ABNORMAL HIGH (ref 3.8–10.6)

## 2017-10-23 LAB — URINALYSIS, COMPLETE (UACMP) WITH MICROSCOPIC
BILIRUBIN URINE: NEGATIVE
Glucose, UA: 50 mg/dL — AB
Ketones, ur: NEGATIVE mg/dL
LEUKOCYTES UA: NEGATIVE
NITRITE: NEGATIVE
PROTEIN: 30 mg/dL — AB
RBC / HPF: >50 RBC/hpf — ABNORMAL HIGH (ref 0–5)
Specific Gravity, Urine: 1.023 (ref 1.005–1.030)
Squamous Epithelial / LPF: NONE SEEN (ref 0–5)
pH: 5 (ref 5.0–8.0)

## 2017-10-23 LAB — GLUCOSE, CAPILLARY
GLUCOSE-CAPILLARY: 218 mg/dL — AB (ref 65–99)
GLUCOSE-CAPILLARY: 136 mg/dL — AB (ref 65–99)
Glucose-Capillary: 201 mg/dL — ABNORMAL HIGH (ref 65–99)
Glucose-Capillary: 149 mg/dL — ABNORMAL HIGH (ref 65–99)
Glucose-Capillary: 121 mg/dL — ABNORMAL HIGH (ref 65–99)

## 2017-10-23 LAB — COMPREHENSIVE METABOLIC PANEL
ALK PHOS: 175 U/L — AB (ref 38–126)
ALT: 35 U/L (ref 17–63)
ANION GAP: 10 (ref 5–15)
AST: 30 U/L (ref 15–41)
Albumin: 2.8 g/dL — ABNORMAL LOW (ref 3.5–5.0)
BILIRUBIN TOTAL: 1.1 mg/dL (ref 0.3–1.2)
BUN: 33 mg/dL — ABNORMAL HIGH (ref 6–20)
CALCIUM: 8.3 mg/dL — AB (ref 8.9–10.3)
CO2: 26 mmol/L (ref 22–32)
Chloride: 102 mmol/L (ref 101–111)
Creatinine, Ser: 1.1 mg/dL (ref 0.61–1.24)
GFR calc non Af Amer: >60 mL/min (ref >60)
Glucose, Bld: 215 mg/dL — ABNORMAL HIGH (ref 65–99)
Potassium: 3.9 mmol/L (ref 3.5–5.1)
SODIUM: 138 mmol/L (ref 135–145)
TOTAL PROTEIN: 6.1 g/dL — AB (ref 6.5–8.1)

## 2017-10-23 LAB — BLOOD GAS, ARTERIAL
ACID-BASE EXCESS: 1.2 mmol/L (ref 0.0–2.0)
Acid-Base Excess: 3.4 mmol/L — ABNORMAL HIGH (ref 0.0–2.0)
BICARBONATE: 25.1 mmol/L (ref 20.0–28.0)
BICARBONATE: 29.1 mmol/L — AB (ref 20.0–28.0)
FIO2: 0.21
FIO2: 0.21
O2 Saturation: 78.5 %
O2 Saturation: 88.7 %
PATIENT TEMPERATURE: 37
PH ART: 7.44 (ref 7.350–7.450)
PO2 ART: 56 mmHg — AB (ref 83.0–108.0)
Patient temperature: 37
pCO2 arterial: 37 mmHg (ref 32.0–48.0)
pCO2 arterial: 47 mmHg (ref 32.0–48.0)
pH, Arterial: 7.4 (ref 7.350–7.450)
pO2, Arterial: 41 mmHg — ABNORMAL LOW (ref 83.0–108.0)

## 2017-10-23 MED ORDER — DIPHENHYDRAMINE HCL 50 MG/ML IJ SOLN
12.5000 mg | Freq: Once | INTRAMUSCULAR | Status: AC
  Administered 2017-10-23: 12.5 mg via INTRAVENOUS
  Filled 2017-10-23: qty 1

## 2017-10-23 MED ORDER — SODIUM CHLORIDE 0.9 % IV SOLN
INTRAVENOUS | Status: DC
  Administered 2017-10-23: 16:00:00 via INTRAVENOUS

## 2017-10-23 MED ORDER — LORAZEPAM 2 MG/ML IJ SOLN
0.5000 mg | Freq: Once | INTRAMUSCULAR | Status: AC
  Administered 2017-10-23: 0.5 mg via INTRAVENOUS
  Filled 2017-10-23: qty 1

## 2017-10-23 MED ORDER — GADOBENATE DIMEGLUMINE 529 MG/ML IV SOLN
20.0000 mL | Freq: Once | INTRAVENOUS | Status: AC | PRN
  Administered 2017-10-23: 18 mL via INTRAVENOUS

## 2017-10-23 MED ORDER — HALOPERIDOL LACTATE 5 MG/ML IJ SOLN: 1.0000 mg | Freq: Once | INTRAMUSCULAR | Status: DC

## 2017-10-23 NOTE — Consult Note (Signed)
STROKE TEAM PROGRESS NOTE   HPI [name]  is a 83 y.o. male with a known history of non compliance, OSA, Afib on (Xarelto), DM, PVD p/w 1d Hx uncontrolled DM,  AF + RVR and SOB in setting of noncompliance and DKA. Pt w/ L BKA, RLE wound. EMS called due to tachycardia, SOB. HR 150-160. CXR showed vascular congestion Glucose 686. Started on insulin gtt, BiPAP in ED and required bipap in ICU. He also has prostate cancer, s/p prostatectomy. He became very combative during admission on 5/31 in the early hours of the morning. Patient is for angiogram due to chronic ulcer, prosthesis here in the hospital, but they could not do the angiogram due to agitation.   Family at bedside. He may have memory problems at baseline, he is noncompliant with cpap and medications, having witnessed apneic events up to a minute at a time.    OBJECTIVE Vitals:   10/23/17 0215 10/23/17 0500 10/23/17 0604 10/23/17 0715  BP: (!) 144/81  124/69 (!) 149/92  Pulse: 95  87 85  Resp:   17 18  Temp:   (!) 97.5 F (36.4 C) 98.6 F (37 C)  TempSrc:   Oral Oral  SpO2: 92%  92% 94%  Weight:  197 lb (89.4 kg)    Height:        CBC:  Recent Labs  Lab 10/17/17 0245  10/18/17 0632 10/19/17 0659 10/23/17 0640  WBC 11.0*   < > 9.5 10.2 14.2*  NEUTROABS 8.5*  --  7.0*  --   --   HGB 15.9   < > 14.1 13.5 14.7  HCT 47.6   < > 41.7 40.4 44.1  MCV 83.0   < > 82.3 82.2 82.8  PLT 167   < > 178 196 235   < > = values in this interval not displayed.    Basic Metabolic Panel:  Recent Labs  Lab 10/17/17 0245 10/19/17 0659 10/20/17 1133 10/21/17 0627 10/23/17 0640  NA 138 136  --  137 138  K 3.6 3.3*  --  3.8 3.9  CL 105 106  --  102 102  CO2 21* 22  --  26 26  GLUCOSE 122* 222*  --  145* 215*  BUN 35* 41*  --  26* 33*  CREATININE 1.20 1.25*  --  0.92 1.10  CALCIUM 8.4* 7.8*  --  8.2* 8.3*  MG 1.9 1.9 1.9  --   --   PHOS 3.2 2.7  --   --   --     Lipid Panel:     Component Value Date/Time   CHOL 108  10/16/2017 1422   TRIG 47 10/16/2017 1422   HDL 37 (L) 10/16/2017 1422   CHOLHDL 2.9 10/16/2017 1422   VLDL 9 10/16/2017 1422   LDLCALC 62 10/16/2017 1422   HgbA1c:  Lab Results  Component Value Date   HGBA1C 7.4 (H) 12/30/2016   Urine Drug Screen: No results found for: LABOPIA, COCAINSCRNUR, LABBENZ, AMPHETMU, THCU, LABBARB  Alcohol Level     Component Value Date/Time   ETH <10 10/15/2017 0008    IMAGING  Ct Head Wo Contrast  Result Date: 10/23/2017 CLINICAL DATA:  Mental status changes with combative behavior. EXAM: CT HEAD WITHOUT CONTRAST TECHNIQUE: Contiguous axial images were obtained from the base of the skull through the vertex without intravenous contrast. COMPARISON:  10/21/2016. FINDINGS: Brain: Generalized atrophy. Chronic small-vessel ischemic changes of the white matter. No sign of acute infarction, mass lesion,  hemorrhage, hydrocephalus or extra-axial collection. Vascular: There is atherosclerotic calcification of the major vessels at the base of the brain. Skull: Negative Sinuses/Orbits: Clear/normal Other: None IMPRESSION: No acute or reversible finding. Atrophy and chronic small-vessel ischemic changes as seen previously. Electronically Signed   By: Nelson Chimes M.D.   On: 10/23/2017 10:02       PHYSICAL EXAM  Physical exam: Exam: Gen: NAD, in bed sleeping but arousable, agitated                CV: irregular, no MRG.  Eyes: Conjunctivae clear without exudates or hemorrhage  Neuro: Detailed Neurologic Exam  Speech:    Speech is not aphasic Cognition:    The patient is sleep but arousable, easily agitated, not following commands, resists eye opening, yells Cranial Nerves:    The pupils are equal, round, and reactive to light. Cound not visualize due to noncooperation. . Blinks to threat. Extraocular movements are intact. Trigeminal sensation is intact and the muscles of mastication are normal. The face is symmetric. The palate elevates in the midline.  Hearing intact. Voice is normal. Shoulder shrug is normal. The tongue has normal motion without fasciculations.   Coordination:    No aparent dysmetria  Gait:    deferred  Motor Observation:    No asymmetry, no atrophy, and no involuntary movements noted. Tone:    Normal muscle tone.      Strength:    Moving all extremities equally and with apparent deficit     Sensation: w/d x 4     Reflex Exam:  DTR's:  Unable due to non cooperation gets easily agitated  Toes:    Left BKA, right toe unable due to non cooperation    Clonus:    Clonus is absent.    ASSESSMENT/PLAN [name] is a 83 y.o. male ale with a known history of non compliance, OSA,  Afib on (Xarelto), CAD DM, PVD p/w 1d Hx uncontrolled DM,  AF + RVR and SOB in setting of noncompliance and DKA. Pt w/ L BKA, RLE wound. EMS called due to tachycardia, SOB. HR 150-160. CXR showed vascular congestion Glucose 686, non ST elevation MI, Started on insulin gtt, BiPAP in ED and required bipap in ICU. He also has prostate cancer, s/p prostatectomy. He became very combative during admission on 5/31 in the early hours of the morning. Patient is for angiogram due to chronic ulcer, prosthesis here in the hospital, but they could not do the angiogram due to agitation.  His EF is 20-25%    Patient has multiple metabolic and infectious reasons for his encephalopathy including s/p MI on admission now EF 20-25%,  uncontrolled glucose (600+ on admission), non-ST, afib with RVR and SOB causing acute systolic heart failure, on bipap in ICU now refusing cpap with witnessed apneic events, right leg wound, possible baseline dementia  Having long apneic events here nt he hospital, refusing cpap, not compliant at home  Hi agitation is being managed with Haldol, he is lethargic, moaning. Ammonia level was normal, stat ABG which was unremarkable. UA negative.  Today WBCs elevated, discussed with attending will order UA, CXR, Blood  cultures. Urine looks dark today, no intake of fluids, discussed starting NS at low rate given Echo 20-25% currently since admitted with MI  Prognosis poor due to multiple medical issues called palliative care  Continue to manage metabolix/toxic/infectious etiologies as WBCs elevated to 14.2, needs complete infectious workup including repeat UA, cxr, blood cultures as above  CT of the head unremarkable, if possible MRI of the brain w/wo contrast to evaluate for stroke or encephalitis however no meningismus on exam  May consider starting Seroquel bid 50mg  and prn Haldol and ativan  EEG tomorrow   If above unrevealing may consider lumbar puncture in morning  .antonia ahern [facility]    To contact Stroke Continuity provider, please refer to [email or web address] After hours, contact General Neurology

## 2017-10-23 NOTE — Progress Notes (Signed)
[facility] [place] NAME: [name]    MR#:  [id number]  DATE OF BIRTH:  1934-08-04  SUBJECTIVE:  CHIEF COMPLAINT:   Chief Complaint  Patient presents with  . Tachycardia  . Hyperglycemia    Came with hypoglycemia, atrial fibrillation and shortness of breath   Required bipap in ICU, stable now, on floor.    Cardiology decided medical management for his CAD   Vascular has planned angiogram for RLE.   pt was very agitated and they did not do angiogram.  pt continue to stay very confused for last 2 days.  REVIEW OF SYSTEMS:   Can not give ROS due to confusion.  ROS  DRUG ALLERGIES:   Allergies  Allergen Reactions  . Contrast Media [Iodinated Diagnostic Agents] Shortness Of Breath    SOB  . Iohexol Shortness Of Breath     Desc: Respiratory Distress, Laryngedema, diaphoresis, Onset Date: [id number]   . Metrizamide Shortness Of Breath    SOB SOB SOB   . Latex Rash    VITALS:  Blood pressure (!) 149/92, pulse 85, temperature 98.6 F (37 C), temperature source Oral, resp. rate 18, height 5\' 7"  (1.702 m), weight 89.4 kg (197 lb), SpO2 94 %.  PHYSICAL EXAMINATION:  GENERAL:  83 y.o.-year-old patient lying in the bed with  acute respiratory distress.  EYES: Pupils equal, round, reactive to light and accommodation. No scleral icterus. Extraocular muscles intact.  HEENT: Head atraumatic, normocephalic. Oropharynx and nasopharynx clear.  NECK:  Supple, no jugular venous distention. No thyroid enlargement, no tenderness.  LUNGS: Normal breath sounds bilaterally, no wheezing,bilateral crepitation. No use of accessory muscles of respiration. Off bipap. CARDIOVASCULAR: S1, S2 normal. No murmurs, rubs, or gallops.  ABDOMEN: Soft, nontender, nondistended. Bowel sounds present. No organomegaly or mass. Foley in with dark urine. EXTREMITIES: No pedal edema, cyanosis, or clubbing. Left-sided below knee amputation.   Right side at the  shin of TBI and there is an injury to the skin with surrounding redness and some yellowish covering on skin. NEUROLOGIC: Cranial nerves II through XII are intact. Muscle strength 3-4/5 in all extremities. Sensation intact. Gait not checked.  PSYCHIATRIC: The patient is alert and oriented x 0. Trying to come out of the bed. SKIN: No obvious rash, lesion, or ulcer.   Physical Exam LABORATORY PANEL:   CBC Recent Labs  Lab 10/23/17 0640  WBC 14.2*  HGB 14.7  HCT 44.1  PLT 235   ------------------------------------------------------------------------------------------------------------------  Chemistries  Recent Labs  Lab 10/20/17 1133  10/23/17 0640  NA  --    < > 138  K  --    < > 3.9  CL  --    < > 102  CO2  --    < > 26  GLUCOSE  --    < > 215*  BUN  --    < > 33*  CREATININE  --    < > 1.10  CALCIUM  --    < > 8.3*  MG 1.9  --   --   AST  --   --  30  ALT  --   --  35  ALKPHOS  --   --  175*  BILITOT  --   --  1.1   < > = values in this interval not displayed.   ------------------------------------------------------------------------------------------------------------------  Cardiac Enzymes Recent Labs  Lab 10/16/17 2013 10/17/17 0245  TROPONINI 7.08* 5.41*   ------------------------------------------------------------------------------------------------------------------  RADIOLOGY:  Dg Chest 1 View  Result Date: 10/23/2017 CLINICAL DATA:  Elevated white blood cell count. The patient is unresponsive. EXAM: CHEST  1 VIEW COMPARISON:  Single-view of the chest 10/19/2017 and 10/18/2016. FINDINGS: There is cardiomegaly. Aeration is improved over the past 2 days. No consolidative process or pneumothorax. Likely trace right pleural effusion. IMPRESSION: Improving aeration over the past 2 days.  No new abnormality. Cardiomegaly. Electronically Signed   By: Inge Rise M.D.   On: 10/23/2017 13:44   Ct Head Wo Contrast  Result Date: 10/23/2017 CLINICAL DATA:   Mental status changes with combative behavior. EXAM: CT HEAD WITHOUT CONTRAST TECHNIQUE: Contiguous axial images were obtained from the base of the skull through the vertex without intravenous contrast. COMPARISON:  10/21/2016. FINDINGS: Brain: Generalized atrophy. Chronic small-vessel ischemic changes of the white matter. No sign of acute infarction, mass lesion, hemorrhage, hydrocephalus or extra-axial collection. Vascular: There is atherosclerotic calcification of the major vessels at the base of the brain. Skull: Negative Sinuses/Orbits: Clear/normal Other: None IMPRESSION: No acute or reversible finding. Atrophy and chronic small-vessel ischemic changes as seen previously. Electronically Signed   By: Nelson Chimes M.D.   On: 10/23/2017 10:02    ASSESSMENT AND PLAN:   Active Problems:   Atrial fibrillation with rapid ventricular response (HCC)  * non-ST elevation MI   Significant rise in his troponin, was on heparin IV drip and further management per cardiology.   On ASA, betablocker, atorvastatin.   plan for cath, but cardiology suggest to manage medically due to his being high risk.  * hyperglycemia- nonketotic   Responded to IV insulin drip, switch to long-acting insulin with sliding scale control.   stable now.   * atrial fibrillation   Likely secondary to stress of hyperglycemia and dehydration.   Currently on digoxin, Cardizem and metoprolol oral.   Heparin IV drip. Stopped now   On xarelto   Had tachycardia- spoke to cardiologist, he is already on high dose cardizema nd metoprolol, suggest to cont the same.  * Altered mental status   Haldol and keep sitter in room.   UA was negative.   Did check Ammonia and CMP- non contributory.   Checked UA- negative again. ABG- non contributory,.   Persistent confusion and agitation- discussed with family, get CT head- negative.   Neuro consult, suggest MRI brain and EEG, if negative, then may need Lumber puncture to r/o encephalitis.    Also send Blood cx.  * acute systolic congestive heart failure   This is secondary to atrial fibrillation.   IV Lasix and BiPAP as needed.   Significant drop in EF, compared to past.   Cont lasix.  * right leg wound   Wound care team.   Appreciated help by podiatry and vascular- plan for angiogram but could not be done due to agitation.   Will check for infection,as now confusion.  * hypokalemia    Replace oral.  Prognosis very poor due to multiple medical issues- called palliative care.   Was able to walk with a prosthesis at home before admission, told his wife to bring prosthesis and get PT eval with that, once improves.   All the records are reviewed and case discussed with Care Management/Social Workerr. Management plans discussed with the patient, family and they are in agreement.  CODE STATUS: Full.  TOTAL TIME TAKING CARE OF THIS PATIENT: 35 minutes.    POSSIBLE D/C IN 2-3 DAYS, DEPENDING ON CLINICAL CONDITION.   Vaughan Basta M.D on 10/23/2017  Between 7am to 6pm - Pager - [phone]  After 6pm go to [email or web address] - password [email or web address] [facility]  Office  [phone]  CC: Primary care physician; Alanson Aly, FNP  Note: This dictation was prepared with Dragon dictation along with smaller phrase technology. Any transcriptional errors that result from this process are unintentional.

## 2017-10-23 NOTE — Progress Notes (Signed)
MD Anselm Jungling was notified of pt having an apnea episode of about 1 minute and 15 seconds and pt is still lethargic. MD will place orders.

## 2017-10-23 NOTE — Progress Notes (Signed)
Junious Dresser, RN placed patient on CPAP a few minutes prior to RT entering room. Patient tol well at this time.

## 2017-10-23 NOTE — Progress Notes (Signed)
RT called to room to place patient on CPAP due to history of sleep apnea. Patient asleep and having periods of apnea. ABG also ordered due to AMS of patient. RT attempted to place patient on Auto CPAP and patient awoke confused and agitated stating to leave him alone and to not put CPAP on. RN and MD at the bedside to witness this along with patients family members. RT also attempted to place patient on nasal cannula but he refused this as well.

## 2017-10-23 NOTE — Progress Notes (Signed)
Pt became combative, punching and kicking, and broke IV extension. Four staff members had to hold his arm while I changed the IV. PRN haldol given with some effect. Pt became drowsy and was difficult to rouse. Rapid Response called but pt awoke and became agitated before RR arrived. ICU Nurse practitioner ordered ABG due to pt not being on CPAP and possibly retaining CO2. MD Jannifer Franklin made aware. EKG ordered. ABG result unremarkable. Pt is no longer combative but began screaming loudly every few minutes. MD Marcille Blanco made aware. Benadryl 12.5 IV and 0.5 Ativan IV given. Pt is sleeping with no concerns offered at this time.

## 2017-10-23 NOTE — Progress Notes (Signed)
PT Cancellation Note  Patient Details Name: [name] MRN: [id number] DOB: October 23, 1934   Cancelled Treatment:    Reason Eval/Treat Not Completed: Other (comment).  Continues to be difficult to control behavior and is medically not stable.  Will try another time to see pt.   Ramond Dial 10/23/2017, 8:03 AM   Mee Hives, PT MS Acute Rehab Dept. Number: [place] [facility] [place]

## 2017-10-23 NOTE — Significant Event (Signed)
Rapid Response Event Note  Overview: Time Called: 0202 Arrival Time: 0203 Event Type: Neurologic  Initial Focused Assessment: Pt. Care RN-Kat reported pt. Was unresponsive post haldol PRN, Wendelyn Breslow spoke with Dr. Jannifer Franklin @ 0203 and reported event- pt. Currently responding to pain, moaning and swinging arms. VSS, but pt. Remains lethargic, unable to follow commands. Wendelyn Breslow, RN reports pt. Has been altered on at least 2 previous shifts.   Interventions: Checked CBG: 218, looked over labs: ammonia level normal, ordered STAT ABG due to pt.'s PMHx of OSA to rule out CO2 retention.   Plan of Care (if not transferred): Discussed case with Dr. Marcille Blanco, pt. Able to protect airway & stable Vitals.  Dr. Marcille Blanco will look for ABG results and order Bipap/Cpap QHS if needed. Discussed plan with Wendelyn Breslow, RN and instructed her to call if there are any acute concerns.  Event Summary: Name of Physician Notified: Dr. Marcille Blanco at [phone]    at    Outcome: Stayed in room and stabalized     Domingo Pulse [place]

## 2017-10-23 NOTE — Progress Notes (Signed)
Pt has a sitter at bedside. Pt is very letheragic due to ativan pre MRI. Family was updated on plan of care. CCU Charge RN aware of apnea episodes and that pt is lethargic. Pt has no further concerns at this time.

## 2017-10-23 NOTE — Progress Notes (Signed)
Rapid response was called on this patient. abg was obtained. Patient is now actively hollering out. Dr Marcille Blanco has been notified by RN of patient's status. bipap not indicated at this time. Will continue to monitor.

## 2017-10-24 DIAGNOSIS — I70238 Atherosclerosis of native arteries of right leg with ulceration of other part of lower right leg: Secondary | ICD-10-CM

## 2017-10-24 DIAGNOSIS — I4891 Unspecified atrial fibrillation: Secondary | ICD-10-CM

## 2017-10-24 DIAGNOSIS — I472 Ventricular tachycardia: Secondary | ICD-10-CM

## 2017-10-24 DIAGNOSIS — E1010 Type 1 diabetes mellitus with ketoacidosis without coma: Secondary | ICD-10-CM

## 2017-10-24 LAB — COMPREHENSIVE METABOLIC PANEL
ALBUMIN: 2.8 g/dL — AB (ref 3.5–5.0)
ALT: 34 U/L (ref 17–63)
ANION GAP: 10 (ref 5–15)
AST: 32 U/L (ref 15–41)
Alkaline Phosphatase: 167 U/L — ABNORMAL HIGH (ref 38–126)
BUN: 29 mg/dL — ABNORMAL HIGH (ref 6–20)
CHLORIDE: 106 mmol/L (ref 101–111)
CO2: 23 mmol/L (ref 22–32)
Calcium: 8.1 mg/dL — ABNORMAL LOW (ref 8.9–10.3)
Creatinine, Ser: 0.93 mg/dL (ref 0.61–1.24)
GFR calc Af Amer: >60 mL/min (ref >60)
GFR calc non Af Amer: >60 mL/min (ref >60)
GLUCOSE: 94 mg/dL (ref 65–99)
POTASSIUM: 3.7 mmol/L (ref 3.5–5.1)
SODIUM: 139 mmol/L (ref 135–145)
Total Bilirubin: 1.2 mg/dL (ref 0.3–1.2)
Total Protein: 6.1 g/dL — ABNORMAL LOW (ref 6.5–8.1)

## 2017-10-24 LAB — GLUCOSE, CAPILLARY
GLUCOSE-CAPILLARY: 91 mg/dL (ref 65–99)
GLUCOSE-CAPILLARY: 418 mg/dL — AB (ref 65–99)
GLUCOSE-CAPILLARY: 101 mg/dL — AB (ref 65–99)
Glucose-Capillary: 311 mg/dL — ABNORMAL HIGH (ref 65–99)
Glucose-Capillary: 110 mg/dL — ABNORMAL HIGH (ref 65–99)

## 2017-10-24 LAB — CBC
HCT: 46.3 % (ref 40.0–52.0)
Hemoglobin: 15.7 g/dL (ref 13.0–18.0)
MCH: 28.1 pg (ref 26.0–34.0)
MCHC: 33.9 g/dL (ref 32.0–36.0)
MCV: 83 fL (ref 80.0–100.0)
Platelets: 241 10*3/uL (ref 150–440)
RBC: 5.58 MIL/uL (ref 4.40–5.90)
RDW: 18.5 % — AB (ref 11.5–14.5)
WBC: 10.8 10*3/uL — ABNORMAL HIGH (ref 3.8–10.6)

## 2017-10-24 MED ORDER — METOPROLOL SUCCINATE ER 50 MG PO TB24
150.0000 mg | ORAL_TABLET | Freq: Every day | ORAL | Status: DC
  Administered 2017-10-25 – 2017-10-28 (×4): 150 mg via ORAL
  Filled 2017-10-24 (×4): qty 3

## 2017-10-24 NOTE — Clinical Social Work Note (Signed)
Clinical Social Work Assessment  Patient Details  Name: [name] MRN: [id number] Date of Birth: 1934-08-28  Date of referral:  10/24/17               Reason for consult:  Facility Placement                Permission sought to share information with:  Family Supports, Customer service manager Permission granted to share information::  Yes, Verbal Permission Granted  Name::     Gaelen, Brager [phone]  [phone] and Desmund, Elman [phone]   Agency::  SNF admissions  Relationship::     Contact Information:     Housing/Transportation Living arrangements for the past 2 months:  Single Family Home Source of Information:  Medical Team Patient Interpreter Needed:  None Criminal Activity/Legal Involvement Pertinent to Current Situation/Hospitalization:  No - Comment as needed Significant Relationships:  Adult Children, Spouse Lives with:  Spouse Do you feel safe going back to the place where you live?  No Need for family participation in patient care:  Yes (Comment)  Care giving concerns:  Patient's family is trying to decide if he should go to SNF or home with hospice services.   Social Worker assessment / plan:  Patient is an 83 year old male who is married and has some dementia.  Patient and his wife has some confusion, assessment completed by reviewing medical record.  Patient has been to rehab before, CSW reviewed patient's information, PT is recommending SNF placement for short term rehab.  CSW will discuss with patient's wife and family about different options and how insurance will pay for stay.  Patient currently has a sitter due to some agitation, patient's family are trying to discuss what the most appropriate discharge plan is ether home with hospice or SNF with palliative to follow.  Patient has been to rehab in the past, but it has been about a year.  Patient's family were not available to ask any other questions, currently.  CSW will follow  up with patient's family at a later time.  Employment status:  Retired Nurse, adult PT Recommendations:  [place] / Referral to community resources:  [place]  Patient/Family's Response to care:  Patient's family were not at bedside, plan is to potentially discharge to SNF.  Patient/Family's Understanding of and Emotional Response to Diagnosis, Current Treatment, and Prognosis:  Patient's family were unavailable to discuss current treatment plan and emotional response to diagnosis.  CSW will follow up with them at a later time.  Emotional Assessment Appearance:  Appears stated age Attitude/Demeanor/Rapport:    Affect (typically observed):  Agitated Orientation:  Oriented to Self Alcohol / Substance use:  Not Applicable Psych involvement (Current and /or in the community):  No (Comment)  Discharge Needs  Concerns to be addressed:  Care Coordination, Lack of Support Readmission within the last 30 days:  No Current discharge risk:  Lack of support system Barriers to Discharge:  Continued Medical Work up, [employer]   Anell Barr 10/24/2017, 4:42 PM

## 2017-10-24 NOTE — Progress Notes (Signed)
CRITICAL VALUE ALERT  Critical Value:  CBG 418 Date & Time Notied:   10/24/17 1210  Provider Notified: MD Mody  Orders Received/Actions taken: see orders

## 2017-10-24 NOTE — Progress Notes (Signed)
Daily Progress Note   Patient Name: [name]       Date: 10/24/2017 DOB: 09/20/1934  Age: 83 y.o. MRN#: [id number] Attending Physician: Jesse Costa, MD Primary Care Physician: Jesse Aly, FNP Admit Date: 10/14/2017  Reason for Consultation/Follow-up: Establishing goals of care  Subjective: Patient is awake, alert to self only, sitting up in chair. He has a sitter at the bedside due to confusion, agitation, and safety. Patient denies pain or discomfort when asked. 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 I spoke to the wife, and she states she is unsure of his disposition. She is waiting for recommendations from physical therapy and hospitalist. Wife verified that she would like [name] to be DNR/DNI given his current conditions and inability to have invasive work-ups or procedures. She remains confused if palliative versus hospice would be most appropriate for him. She feels if rehabilitation is a recommendation for him then palliative outpatient would be most sufficient compared to hospice. We discussed the family's goals of care for him and his disease trajectory. Wife states she will need to discuss with son and think about what they would want from here, but continues to states she wanted to know what physical therapy had to add to the discussion. Advised we could evaluate once we received their recommendations and compare to their goals for him. She verbalized understanding and appreciation. We will plan to follow-up tomorrow morning for further discussion.   Chart Reviewed.   Length of Stay: 9  Current Medications: Scheduled Meds:  . aspirin  81 mg  Oral Daily  . atorvastatin  80 mg Oral q1800  . collagenase   Topical Daily  . digoxin  0.125 mg Oral Daily  . diltiazem  300 mg Oral Daily  . famotidine  20 mg Oral BID  . furosemide  40 mg Intravenous Daily  . haloperidol lactate  1 mg Intramuscular Once  . insulin aspart  0-15 Units Subcutaneous TID WC  . insulin aspart  0-5 Units Subcutaneous QHS  . insulin aspart  4 Units Subcutaneous TID WC  . insulin glargine  15 Units Subcutaneous QHS  . lamoTRIgine  100 mg Oral BID  .  mouth rinse  15 mL Mouth Rinse BID  . [START ON 10/25/2017] metoprolol succinate  150 mg Oral Daily  . polyethylene glycol  17 g Oral Daily  . potassium chloride  20 mEq Oral BID  . risperiDONE  0.5 mg Oral QHS  . rivaroxaban  20 mg Oral Q supper  . senna-docusate  1 tablet Oral Daily  . sodium chloride flush  3 mL Intravenous Q12H  . ziprasidone  10 mg Intramuscular Once    Continuous Infusions:   PRN Meds: acetaminophen, bisacodyl, dextrose, haloperidol lactate, LORazepam, metoprolol tartrate, ondansetron (ZOFRAN) IV, senna-docusate  Physical Exam  Constitutional: He appears well-developed. He has a sickly appearance.  Cardiovascular: Regular rhythm, normal heart sounds, intact distal pulses and normal pulses. Tachycardia present.  Pulmonary/Chest: Effort normal. He has decreased breath sounds.  Abdominal: Soft. Normal appearance.  Musculoskeletal:  Generalized weakness, left leg amputation w/prosthesis   Neurological: He is alert.  Psychiatric: Cognition and memory are impaired. He expresses inappropriate judgment.  Confusion   Nursing note and vitals reviewed.           Vital Signs: BP (!) 123/59 (BP Location: Right Arm)   Pulse 86   Temp 98 F (36.7 C) (Oral)   Resp 19   Ht 5\' 7"  (1.702 m)   Wt 80.7 kg (178 lb)   SpO2 94%   BMI 27.88 kg/m  SpO2: SpO2: 94 % O2 Device: O2 Device: Room Air O2 Flow Rate: O2 Flow Rate (L/min): 7.5 L/min  Intake/output summary:   Intake/Output Summary  (Last 24 hours) at 10/24/2017 1345 Last data filed at 10/24/2017 1252 Gross per 24 hour  Intake 955.83 ml  Output 1550 ml  Net -594.17 ml   LBM: Last BM Date: 10/22/17 Baseline Weight: Weight: 81.6 kg (180 lb) Most recent weight: Weight: 80.7 kg (178 lb)       Palliative Assessment/Data:PPS 40%   Flowsheet Rows     Most Recent Value  Intake Tab  Referral Department  Hospitalist  Unit at Time of Referral  Med/Surg Unit  Palliative Care Primary Diagnosis  Cardiac  Date Notified  10/19/17  Palliative Care Type  New Palliative care  Reason for referral  Clarify Goals of Care  Date of Admission  10/14/17  Date first seen by Palliative Care  10/19/17  # of days Palliative referral response time  0 Day(s)  # of days IP prior to Palliative referral  5  Clinical Assessment  Psychosocial & Spiritual Assessment  Palliative Care Outcomes      Patient Active Problem List   Diagnosis Date Noted  . Atrial fibrillation with rapid ventricular response ([place]) 10/15/2017  . BKA stump complication ([place]) 34/19/3790  . 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 S/P BKA (below knee amputation) unilateral, left ([place]) 02/01/2017  . Chronic diastolic heart failure ([place]) 01/21/2017  . Pressure injury of skin 01/18/2017  . 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 Diabetic foot ulcer ([place]) 12/29/2016  . Ataxia 10/21/2016  . History of femoral angiogram 09/06/2016  . Left leg pain 09/06/2016  . Leukocytosis 09/06/2016  . Generalized weakness 09/06/2016  . A-fib ([place]) 08/30/2016  . Pneumonia 05/19/2016  . Hyperlipidemia 04/20/2016  . Back pain 04/19/2016  . Type 2 diabetes mellitus treated with insulin ([place]) 04/19/2016  . Essential hypertension 04/19/2016  . PAD (peripheral artery disease) ([place]) 04/19/2016  . Erectile dysfunction following radical prostatectomy 06/25/2015  . History of prostate cancer 12/23/2014  .  Incontinence  12/23/2014    Palliative Care Assessment & Plan   Patient Profile: [name] a58 y.o.malewith a known history of AF (Xarelto), DM, PVD, T2IDDM, AF + RVR. Pt w/ L BKA, RLE wound.  Assessment: Out of bed to chair. Confusion and agitation requiring 1:1 sitter. Tachycardia resolving.   Recommendations/Plan: DNR/DNI at wife's request Continue to treat the treatable at family's request. Will continue to follow-up with family to discuss disposition needs and recommendations. Family is wanting to hear what PT recommendation are. He was able to work with them on today.  Would recommend outpatient palliative services at discharge at minimum.   Goals of Care and Additional Recommendations: Limitations on Scope of Treatment: Full Scope Treatment-continue to treat the treatable.   Code Status:    Code Status Orders  (From admission, onward)        Start     Ordered   10/24/17 1152  Do not attempt resuscitation (DNR)  Continuous    Question Answer Comment  In the event of cardiac or respiratory ARREST Do not call a "code blue"   In the event of cardiac or respiratory ARREST Do not perform Intubation, CPR, defibrillation or ACLS   In the event of cardiac or respiratory ARREST Use medication by any route, position, wound care, and other measures to relive pain and suffering. May use oxygen, suction and manual treatment of airway obstruction as needed for comfort.      10/24/17 1152    Code Status History    Date Active Date Inactive Code Status Order ID Comments User Context   10/15/2017 0309 10/24/2017 1152 Full Code [id number]  Arta Silence, MD Inpatient   02/10/2017 1904 02/12/2017 1908 Full Code [id number]  Katha Cabal, MD ED   02/10/2017 1638 02/10/2017 1904 Full Code [id number]  Sela Hua, PA-C Outpatient   01/08/2017 1342 01/19/2017 2103 Full Code [id number]  Loletha Grayer, MD ED   12/29/2016 2232 12/30/2016 1634 Full Code [id number]  Lance Coon, MD ED    12/20/2016 1251 12/20/2016 1842 Full Code [id number]  Algernon Huxley, MD Inpatient   10/22/2016 0040 10/24/2016 2058 Full Code [id number]  Lance Coon, MD Inpatient   10/14/2016 1201 10/14/2016 1925 Full Code [id number]  Algernon Huxley, MD Inpatient   08/30/2016 1338 09/06/2016 1941 Full Code [id number]  Epifanio Lesches, MD Inpatient   05/19/2016 0454 05/22/2016 1907 Full Code [id number]  Saundra Shelling, MD Inpatient      Prognosis:  Unable to determine-guarded to poor in the setting of confusion, decreased mobility, PVD, hypertension, tachycardia, diabetes, chronic kidney disease, a-fibb, Non-Stemi (not a candidate for invasive work-up) Troponin max 10.62, 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   Discharge Planning: To Be Determined, would recommend palliative outpatient at minimum.   Care plan was discussed with patient's wife and bedside RN.   Thank you for allowing the Palliative Medicine Team to assist in the care of this patient.   Total Time 35 min.  Prolonged Time Billed  NO      Greater than 50%  of this time was spent counseling and coordinating care related to the above assessment and plan.  Alda Lea, NP-BC Palliative Medicine Team  Phone: [phone] Fax: [phone]  Please contact Palliative Medicine Team phone at [phone] for questions and concerns.

## 2017-10-24 NOTE — Evaluation (Signed)
Physical Therapy Evaluation Patient Details Name: [name] MRN: [id number] DOB: 01-Jan-1935 Today's Date: 10/24/2017   History of Present Illness  presented to ER secondary to tachycardia, hyperglycemia; admitted for management of afib with RVR.  PAtient noted with ulceration to R distal LE; attempted angiogram during hospitalization, cancelled due to patient agitation.  Clinical Impression  Upon evaluation, patient alert and oriented to self, location; unaware of date, safety needs.  Poor insight, moderately impulsive with all mobility tasks.  Demonstrates ability to complete bed mobility with mod assist; sit/stand, basic transfers and short-distance gait (15') with RW, min//mod assist (chair follow for safety).  Maintains very forward flexed standing posture, min assist for walker position and control throughout gait trial.  Poor standing balance, tolerance; hands-on assist required at all times. Vitals stable and WFL throughout session-sats >92% on RA; HR 80-90s at rest and with exertion. Would benefit from skilled PT to address above deficits and promote optimal return to PLOF; recommend transition to STR upon discharge from acute hospitalization.     Follow Up Recommendations SNF    Equipment Recommendations       Recommendations for Other Services       Precautions / Restrictions Precautions Precautions: Fall Restrictions Weight Bearing Restrictions: No      Mobility  Bed Mobility Overal bed mobility: Needs Assistance Bed Mobility: Supine to Sit     Supine to sit: Mod assist        Transfers Overall transfer level: Needs assistance Equipment used: Rolling walker (2 wheeled) Transfers: Sit to/from Stand Sit to Stand: Min assist;Mod assist         General transfer comment: assist for hand/foot placement, lift off and standing balance; maintains forward flexed posture  Ambulation/Gait Ambulation/Gait assistance: Min assist;+2 safety/equipment Ambulation  Distance (Feet): 15 Feet Assistive device: Rolling walker (2 wheeled)(L LE prosthesis)       General Gait Details: reciprocal stepping pattern; slow and guarded, limited balance reactions evident.  Maintains forward trunk flexion, heavy WBing bilat UEs.  Poor activity tolerance/endurance, with quality of gait deteriorating after 15' (necessitating seated rest period)  Stairs            Wheelchair Mobility    Modified Rankin (Stroke Patients Only)       Balance Overall balance assessment: Needs assistance Sitting-balance support: No upper extremity supported Sitting balance-Leahy Scale: Fair     Standing balance support: Bilateral upper extremity supported Standing balance-Leahy Scale: Poor                               Pertinent Vitals/Pain Pain Assessment: No/denies pain    Home Living Family/patient expects to be discharged to:: Private residence Living Arrangements: Spouse/significant other Available Help at Discharge: Family;Available 24 hours/day Type of Home: House Home Access: Ramped entrance     Home Layout: One level Home Equipment: Walker - 2 wheels;Cane - single point      Prior Function Level of Independence: Independent with assistive device(s)         Comments: Mod indep with SPC for ADLs, household and community mobilization; denies fall history.     Hand Dominance        Extremity/Trunk Assessment   Upper Extremity Assessment Upper Extremity Assessment: Overall WFL for tasks assessed    Lower Extremity Assessment Lower Extremity Assessment: Generalized weakness(grossly at least 4-/5 throughout; no focal weakness, sensory or coordination deficit noted)       Communication  Communication: No difficulties  Cognition Arousal/Alertness: Awake/alert Behavior During Therapy: Impulsive Overall Cognitive Status: Impaired/Different from baseline                                 General Comments: oriented to  self, location as hospital; unaware of date.  Follows simple, one-step commands, but dem; demonstrates poor insigh/safety awareness.   Deficits in STM; mild motor/task perseveation at times      General Comments      Exercises Other Exercises Other Exercises: Sit/stand, bed/chair with RW, min/mod assist +1 Other Exercises: Unsupported sitting balance, min/mod assist progressing to close sup with accommodation to position Other Exercises: Donned L LE prosthesis for all mobility/WBing tasks, mod assist   Assessment/Plan    PT Assessment Patient needs continued PT services  PT Problem List Decreased strength;Decreased activity tolerance;Decreased mobility;Decreased cognition;Decreased safety awareness;Cardiopulmonary status limiting activity;Decreased range of motion;Decreased balance;Decreased coordination;Decreased knowledge of use of DME;Decreased knowledge of precautions;Decreased skin integrity       PT Treatment Interventions DME instruction;Gait training;Functional mobility training;Therapeutic exercise;Therapeutic activities;Balance training;Cognitive remediation;Patient/family education    PT Goals (Current goals can be found in the Care Plan section)  Acute Rehab PT Goals Patient Stated Goal: to get out of the bed PT Goal Formulation: With patient/family Time For Goal Achievement: 11/07/17 Potential to Achieve Goals: Good    Frequency Min 2X/week   Barriers to discharge        Co-evaluation               AM-PAC PT "6 Clicks" Daily Activity  Outcome Measure Difficulty turning over in bed (including adjusting bedclothes, sheets and blankets)?: Unable Difficulty moving from lying on back to sitting on the side of the bed? : Unable Difficulty sitting down on and standing up from a chair with arms (e.g., wheelchair, bedside commode, etc,.)?: Unable Help needed moving to and from a bed to chair (including a wheelchair)?: A Little Help needed walking in hospital room?:  A Lot Help needed climbing 3-5 steps with a railing? : A Lot 6 Click Score: 10    End of Session Equipment Utilized During Treatment: Gait belt Activity Tolerance: Patient tolerated treatment well Patient left: in chair;with call bell/phone within reach;with chair alarm set;with nursing/sitter in room;with family/visitor present Nurse Communication: Mobility status PT Visit Diagnosis: Unsteadiness on feet (R26.81);Muscle weakness (generalized) (M62.81);Difficulty in walking, not elsewhere classified (R26.2)    Time: 9892-1194 PT Time Calculation (min) (ACUTE ONLY): 37 min   Charges:   PT Evaluation $PT Eval Moderate Complexity: 1 Mod PT Treatments $Therapeutic Activity: 23-37 mins   PT G Codes:        Lineth Thielke H. Owens Shark, PT, DPT, NCS 10/24/17, 9:14 PM [phone]

## 2017-10-24 NOTE — Progress Notes (Signed)
[facility]  Daily Progress Note   Subjective  - 3 Days Post-Op  Patient remains lethargic.  Currently on BiPAP and unable to provide any history  Objective Vitals:   10/24/17 0452 10/24/17 0500 10/24/17 0858 10/24/17 1153  BP: (!) 162/93  (!) 167/122 (!) 123/59  Pulse: 70  (!) 137 86  Resp:    19  Temp: 98 F (36.7 C)  98.3 F (36.8 C) 98 F (36.7 C)  TempSrc: Oral  Oral Oral  SpO2: 94% 94% 97% 94%  Weight:      Height:        Intake/Output Summary (Last 24 hours) at 10/24/2017 1557 Last data filed at 10/24/2017 1350 Gross per 24 hour  Intake 1195.83 ml  Output 1550 ml  Net -354.17 ml    PULM  CTAB CV  RRR VASC  poorly palpable pedal pulses on the right with stable ulceration above the right medial ankle.  Left BKA site well-healed  Laboratory CBC    Component Value Date/Time   WBC 10.8 (H) 10/24/2017 0454   HGB 15.7 10/24/2017 0454   HGB 16.3 01/20/2013 0255   HCT 46.3 10/24/2017 0454   HCT 47.9 01/20/2013 0255   PLT 241 10/24/2017 0454   PLT 245 01/20/2013 0255    BMET    Component Value Date/Time   NA 139 10/24/2017 0454   NA 140 01/20/2013 0255   K 3.7 10/24/2017 0454   K 3.5 01/20/2013 0255   CL 106 10/24/2017 0454   CL 103 01/20/2013 0255   CO2 23 10/24/2017 0454   CO2 30 01/20/2013 0255   GLUCOSE 94 10/24/2017 0454   GLUCOSE 145 (H) 01/20/2013 0255   BUN 29 (H) 10/24/2017 0454   BUN 22 (H) 01/20/2013 0255   CREATININE 0.93 10/24/2017 0454   CREATININE 1.09 01/20/2013 0255   CALCIUM 8.1 (L) 10/24/2017 0454   CALCIUM 9.4 01/20/2013 0255   GFRNONAA >60 10/24/2017 0454   GFRNONAA >60 01/20/2013 0255   GFRAA >60 10/24/2017 0454   GFRAA >60 01/20/2013 0255    Assessment/Planning:    Right leg PAD with ulceration.  Was scheduled for angiogram Friday but this was postponed secondary to his worsening mental status.  If his mental status improves and the family desires aggressive care, later this week we will try to  reschedule.  Left leg BKA.  Well-healed.  No new issues  Right posterior circulation stroke.  Explains his mental status changes.  Likely from his intermittent A. fib    Leotis Pain  10/24/2017, 3:57 PM

## 2017-10-24 NOTE — Care Management Important Message (Signed)
Copy of signed IM left with patient and family in room. 

## 2017-10-24 NOTE — Progress Notes (Signed)
SWOT RN review related to MEWS patient with hyperglycemia for lunch CBG. Insulin held this am. CBG 91. Patient consumed breakfast. CBG 418 at 11:57. Notified primary RN for repeat CBG s/p insulin administration. VS at 11:53 demonstrate HTN and tachycardia are now resolved. Continuing to hold Haldol at this time QTC 485.

## 2017-10-24 NOTE — Progress Notes (Signed)
Family Meeting Note  Advance Directive:yes  Today a meeting took place with the spouse.  Patient is unable to participate due HW:YSHUOH capacity confused cva   The following clinical team members were present during this meeting:MD  The following were discussed:Patient's diagnosis: Non-STEMI, PAD, subacute stroke, Patient's progosis: Unable to determine and Goals for treatment: DNR  Additional follow-up to be provided: DNR ordered  Time spent during discussion:17 min  Jesse Bottcher, MD

## 2017-10-24 NOTE — Progress Notes (Signed)
Subjective: Patient reports no new neurological complaints.    Objective: Current vital signs: BP 122/74 (BP Location: Right Arm)   Pulse 84   Temp 98 F (36.7 C) (Oral)   Resp 19   Ht 5\' 7"  (1.702 m)   Wt 80.7 kg (178 lb)   SpO2 100%   BMI 27.88 kg/m  Vital signs in last 24 hours: Temp:  [97.4 F (36.3 C)-98.3 F (36.8 C)] 98 F (36.7 C) (06/03 1711) Pulse Rate:  [51-137] 84 (06/03 1711) Resp:  [18-19] 19 (06/03 1711) BP: (122-167)/(59-122) 122/74 (06/03 1711) SpO2:  [94 %-100 %] 100 % (06/03 1711) Weight:  [80.7 kg (178 lb)] 80.7 kg (178 lb) (06/03 0405)  Intake/Output from previous day: 06/02 0701 - 06/03 0700 In: 595.8 [I.V.:595.8] Out: 925 [Urine:925] Intake/Output this shift: Total I/O In: 1080 [P.O.:1080] Out: 625 [Urine:625] Nutritional status:  Diet Order           Diet heart healthy/carb modified Room service appropriate? Yes; Fluid consistency: Thin  Diet effective now          Neurologic Exam: Mental Status: Alert and awake.  Speech dysarthric.  Follows commands.  Reports he is in the hospital but that it is 2021.  Can not tell me where the hospital is.  . Cranial Nerves: II: Blinks to bilateral confrontation III,IV, VI: ptosis not present, extra-ocular motions intact bilaterally V,VII: smile symmetric, facial light touch sensation normal bilaterally VIII: hearing normal bilaterally IX,X: gag reflex present XI: bilateral shoulder shrug XII: midline tongue extension Motor: Right : Upper extremity   5/5    Left:     Upper extremity   5/5  Lower extremity   5/5     Lower extremity   5/5 Tone and bulk:normal tone throughout; no atrophy noted Sensory: Pinprick and light touch intact throughout, bilaterally Cerebellar: normal finger-to-nose and normal heel-to-shin testing bilaterally   Lab Results: Basic Metabolic Panel: Recent Labs  Lab 10/19/17 0659 10/20/17 1133 10/21/17 0627 10/23/17 0640 10/24/17 0454  NA 136  --  137 138 139  K 3.3*   --  3.8 3.9 3.7  CL 106  --  102 102 106  CO2 22  --  26 26 23   GLUCOSE 222*  --  145* 215* 94  BUN 41*  --  26* 33* 29*  CREATININE 1.25*  --  0.92 1.10 0.93  CALCIUM 7.8*  --  8.2* 8.3* 8.1*  MG 1.9 1.9  --   --   --   PHOS 2.7  --   --   --   --     Liver Function Tests: Recent Labs  Lab 10/23/17 0640 10/24/17 0454  AST 30 32  ALT 35 34  ALKPHOS 175* 167*  BILITOT 1.1 1.2  PROT 6.1* 6.1*  ALBUMIN 2.8* 2.8*   No results for input(s): LIPASE, AMYLASE in the last 168 hours. Recent Labs  Lab 10/22/17 1719  AMMONIA <9*    CBC: Recent Labs  Lab 10/18/17 0632 10/19/17 0659 10/23/17 0640 10/24/17 0454  WBC 9.5 10.2 14.2* 10.8*  NEUTROABS 7.0*  --   --   --   HGB 14.1 13.5 14.7 15.7  HCT 41.7 40.4 44.1 46.3  MCV 82.3 82.2 82.8 83.0  PLT 178 196 235 241    Cardiac Enzymes: No results for input(s): CKTOTAL, CKMB, CKMBINDEX, TROPONINI in the last 168 hours.  Lipid Panel: No results for input(s): CHOL, TRIG, HDL, CHOLHDL, VLDL, LDLCALC in the last 168 hours.  CBG: Recent Labs  Lab 10/23/17 2124 10/24/17 0852 10/24/17 1157 10/24/17 1427 10/24/17 1712  GLUCAP 121* 91 418* 311* 101*    Microbiology: Results for orders placed or performed during the hospital encounter of 10/14/17  MRSA PCR Screening     Status: None   Collection Time: 10/15/17  3:28 AM  Result Value Ref Range Status   MRSA by PCR NEGATIVE NEGATIVE Final    Comment:        The GeneXpert MRSA Assay (FDA approved for NASAL specimens only), is one component of a comprehensive MRSA colonization surveillance program. It is not intended to diagnose MRSA infection nor to guide or monitor treatment for MRSA infections. Performed at [facility], [address] [place], [place] [address]     Coagulation Studies: No results for input(s): LABPROT, INR in the last 72 hours.  Imaging: Dg Chest 1 View  Result Date: 10/23/2017 CLINICAL DATA:  Elevated white blood cell count. The  patient is unresponsive. EXAM: CHEST  1 VIEW COMPARISON:  Single-view of the chest 10/19/2017 and 10/18/2016. FINDINGS: There is cardiomegaly. Aeration is improved over the past 2 days. No consolidative process or pneumothorax. Likely trace right pleural effusion. IMPRESSION: Improving aeration over the past 2 days.  No new abnormality. Cardiomegaly. Electronically Signed   By: Inge Rise M.D.   On: 10/23/2017 13:44   Ct Head Wo Contrast  Result Date: 10/23/2017 CLINICAL DATA:  Mental status changes with combative behavior. EXAM: CT HEAD WITHOUT CONTRAST TECHNIQUE: Contiguous axial images were obtained from the base of the skull through the vertex without intravenous contrast. COMPARISON:  10/21/2016. FINDINGS: Brain: Generalized atrophy. Chronic small-vessel ischemic changes of the white matter. No sign of acute infarction, mass lesion, hemorrhage, hydrocephalus or extra-axial collection. Vascular: There is atherosclerotic calcification of the major vessels at the base of the brain. Skull: Negative Sinuses/Orbits: Clear/normal Other: None IMPRESSION: No acute or reversible finding. Atrophy and chronic small-vessel ischemic changes as seen previously. Electronically Signed   By: Nelson Chimes M.D.   On: 10/23/2017 10:02   Mr Jeri Cos JA Contrast  Result Date: 10/23/2017 CLINICAL DATA:  In cephalopathy. EXAM: MRI HEAD WITHOUT AND WITH CONTRAST TECHNIQUE: Multiplanar, multiecho pulse sequences of the brain and surrounding structures were obtained without and with intravenous contrast. CONTRAST:  65mL MULTIHANCE GADOBENATE DIMEGLUMINE 529 MG/ML IV SOLN COMPARISON:  Head CT 10/23/2017 and MRI 10/22/2016 FINDINGS: Brain: There is a 7 mm focus of hyperintense trace diffusion signal in the right cerebellar hemisphere without significantly reduced ADC and with increased T2/FLAIR signal and enhancement most compatible with a subacute infarct. No restricted diffusion is seen elsewhere to indicate a more acute  infarct. A tiny chronic infarct in the left cerebellum is unchanged from the prior MRI. Periventricular white matter T2 hyperintensities are nonspecific but compatible with mild chronic small vessel ischemic disease, less prominent than on the prior MRI though this may be at least partly due to differences in technique. No abnormal supratentorial enhancement is identified. There is moderate cerebral atrophy. No mass, midline shift, intracranial hemorrhage, or extra-axial fluid collection is seen. Vascular: Major intracranial vascular flow voids are preserved. Skull and upper cervical spine: Unremarkable bone marrow signal. Sinuses/Orbits: Bilateral cataract extraction. Paranasal sinuses and mastoid air cells are clear. Other: None. IMPRESSION: 1. 7 mm subacute right cerebellar infarct. 2. No acute infarct or other acute intracranial abnormality. 3. Mild chronic small vessel ischemic disease and moderate cerebral atrophy. Electronically Signed   By: Seymour Bars.D.  On: 10/23/2017 14:54    Medications:  I have reviewed the patient's current medications. Scheduled: . aspirin  81 mg Oral Daily  . atorvastatin  80 mg Oral q1800  . collagenase   Topical Daily  . digoxin  0.125 mg Oral Daily  . diltiazem  300 mg Oral Daily  . famotidine  20 mg Oral BID  . furosemide  40 mg Intravenous Daily  . haloperidol lactate  1 mg Intramuscular Once  . insulin aspart  0-15 Units Subcutaneous TID WC  . insulin aspart  0-5 Units Subcutaneous QHS  . insulin aspart  4 Units Subcutaneous TID WC  . insulin glargine  15 Units Subcutaneous QHS  . lamoTRIgine  100 mg Oral BID  . mouth rinse  15 mL Mouth Rinse BID  . [START ON 10/25/2017] metoprolol succinate  150 mg Oral Daily  . polyethylene glycol  17 g Oral Daily  . potassium chloride  20 mEq Oral BID  . risperiDONE  0.5 mg Oral QHS  . rivaroxaban  20 mg Oral Q supper  . senna-docusate  1 tablet Oral Daily  . sodium chloride flush  3 mL Intravenous Q12H  .  ziprasidone  10 mg Intramuscular Once    Assessment/Plan: 83 year old with AMS.  MRI of the brain reviewed and reveals a right acute cerebellar infarct.  Patient with afib wRVR  Therefore likely embolic in etiology.  Patient on ASA and Rivaroxaban.  Labs improving, including white blood cell count and patient afebrile.  LP not indicated at this time.   Echocardiogram shows EF of 20-25% with akinesis of the apical myocardium.    Recommendations: 1. PT/OT, speech therapy 2. Agree with continued anticoagulation 3. Carotid dopplers 4. Fasting lipid panel, A1c     LOS: 9 days   Alexis Goodell, MD Neurology [phone] 10/24/2017  6:32 PM

## 2017-10-24 NOTE — Progress Notes (Signed)
Inpatient Diabetes Program Recommendations  AACE/ADA: New Consensus Statement on Inpatient Glycemic Control (2015)  Target Ranges:  Prepandial:   less than 140 mg/dL      Peak postprandial:   less than 180 mg/dL (1-2 hours)      Critically ill patients:  140 - 180 mg/dL   Lab Results  Component Value Date   GLUCAP 418 (H) 10/24/2017   HGBA1C 7.4 (H) 12/30/2016    Review of Glycemic ControlResults for ROY, TOKARZ (MRN [id number]) as of 10/24/2017 12:48  Ref. Range 10/23/2017 11:42 10/23/2017 16:08 10/23/2017 21:24 10/24/2017 08:52 10/24/2017 11:57  Glucose-Capillary Latest Ref Range: 65 - 99 mg/dL 149 (H) 136 (H) 121 (H) 91 418 (H)    Diabetes history: Type 2 DM  Outpatient Diabetes medications:  Lantus 10 units q HS, Metformin 4270 mg bid, Trulicity 6.23 mg once a week, Novolog 0-5 units tid with meals Current orders for Inpatient glycemic control:  Novolog moderate tid with meals and HS, Novolog 4 units tid with meals Lantus 15 units q HS Inpatient Diabetes Program Recommendations:    Note that blood sugars increased this morning less than 2 hours after eating.  He did not receive Novolog meal coverage this morning as well.  Agree with current orders.  Will talk to RN regarding timing of insulin/ blood sugar checks which may give more accurate CBG results.   Thanks  Adah Perl, RN, BC-ADM Inpatient Diabetes Coordinator Pager [phone] (8a-5p)

## 2017-10-24 NOTE — Progress Notes (Signed)
[name] at [place] NAME: [name]    MR#:  [id number]  DATE OF BIRTH:  05/26/1934  SUBJECTIVE:   Wife is at bedside Patient confused  REVIEW OF SYSTEMS:    Unable to obtain as patient is confused  Tolerating Diet: yes      DRUG ALLERGIES:   Allergies  Allergen Reactions  . Contrast Media [Iodinated Diagnostic Agents] Shortness Of Breath    SOB  . Iohexol Shortness Of Breath     Desc: Respiratory Distress, Laryngedema, diaphoresis, Onset Date: [id number]   . Metrizamide Shortness Of Breath    SOB SOB SOB   . Latex Rash    VITALS:  Blood pressure (!) 167/122, pulse (!) 137, temperature 98.3 F (36.8 C), temperature source Oral, resp. rate 18, height 5\' 7"  (1.702 m), weight 80.7 kg (178 lb), SpO2 97 %.  PHYSICAL EXAMINATION:  Constitutional: Appears ill appearing no distress. HENT: Normocephalic. Marland Kitchen Oropharynx is clear and moist.  Eyes: Conjunctivae and EOM are normal. PERRLA, no scleral icterus.  Neck: Normal ROM. Neck supple. No JVD. No tracheal deviation. CVS: RRR, S1/S2 +, no murmurs, no gallops, no carotid bruit.  Pulmonary: Effort and breath sounds normal, no stridor, rhonchi, wheezes, rales.  Abdominal: Soft. BS +,  no distension, tenderness, rebound or guarding.  Musculoskeletal: Normal range of motion. No edema and no tenderness.  Neuro: Alert.  Left BKA  skin: Right leg with bandage  psychiatric: confused wearing hand mitts    LABORATORY PANEL:   CBC Recent Labs  Lab 10/24/17 0454  WBC 10.8*  HGB 15.7  HCT 46.3  PLT 241   ------------------------------------------------------------------------------------------------------------------  Chemistries  Recent Labs  Lab 10/20/17 1133  10/24/17 0454  NA  --    < > 139  K  --    < > 3.7  CL  --    < > 106  CO2  --    < > 23  GLUCOSE  --    < > 94  BUN  --    < > 29*  CREATININE  --    < > 0.93  CALCIUM  --    < > 8.1*  MG 1.9  --   --    AST  --    < > 32  ALT  --    < > 34  ALKPHOS  --    < > 167*  BILITOT  --    < > 1.2   < > = values in this interval not displayed.   ------------------------------------------------------------------------------------------------------------------  Cardiac Enzymes No results for input(s): TROPONINI in the last 168 hours. ------------------------------------------------------------------------------------------------------------------  RADIOLOGY:  Dg Chest 1 View  Result Date: 10/23/2017 CLINICAL DATA:  Elevated white blood cell count. The patient is unresponsive. EXAM: CHEST  1 VIEW COMPARISON:  Single-view of the chest 10/19/2017 and 10/18/2016. FINDINGS: There is cardiomegaly. Aeration is improved over the past 2 days. No consolidative process or pneumothorax. Likely trace right pleural effusion. IMPRESSION: Improving aeration over the past 2 days.  No new abnormality. Cardiomegaly. Electronically Signed   By: Inge Rise M.D.   On: 10/23/2017 13:44   Ct Head Wo Contrast  Result Date: 10/23/2017 CLINICAL DATA:  Mental status changes with combative behavior. EXAM: CT HEAD WITHOUT CONTRAST TECHNIQUE: Contiguous axial images were obtained from the base of the skull through the vertex without intravenous contrast. COMPARISON:  10/21/2016. FINDINGS: Brain: Generalized atrophy. Chronic small-vessel ischemic changes of the white matter. No  sign of acute infarction, mass lesion, hemorrhage, hydrocephalus or extra-axial collection. Vascular: There is atherosclerotic calcification of the major vessels at the base of the brain. Skull: Negative Sinuses/Orbits: Clear/normal Other: None IMPRESSION: No acute or reversible finding. Atrophy and chronic small-vessel ischemic changes as seen previously. Electronically Signed   By: Nelson Chimes M.D.   On: 10/23/2017 10:02   Mr Jeri Cos SA Contrast  Result Date: 10/23/2017 CLINICAL DATA:  In cephalopathy. EXAM: MRI HEAD WITHOUT AND WITH CONTRAST TECHNIQUE:  Multiplanar, multiecho pulse sequences of the brain and surrounding structures were obtained without and with intravenous contrast. CONTRAST:  95mL MULTIHANCE GADOBENATE DIMEGLUMINE 529 MG/ML IV SOLN COMPARISON:  Head CT 10/23/2017 and MRI 10/22/2016 FINDINGS: Brain: There is a 7 mm focus of hyperintense trace diffusion signal in the right cerebellar hemisphere without significantly reduced ADC and with increased T2/FLAIR signal and enhancement most compatible with a subacute infarct. No restricted diffusion is seen elsewhere to indicate a more acute infarct. A tiny chronic infarct in the left cerebellum is unchanged from the prior MRI. Periventricular white matter T2 hyperintensities are nonspecific but compatible with mild chronic small vessel ischemic disease, less prominent than on the prior MRI though this may be at least partly due to differences in technique. No abnormal supratentorial enhancement is identified. There is moderate cerebral atrophy. No mass, midline shift, intracranial hemorrhage, or extra-axial fluid collection is seen. Vascular: Major intracranial vascular flow voids are preserved. Skull and upper cervical spine: Unremarkable bone marrow signal. Sinuses/Orbits: Bilateral cataract extraction. Paranasal sinuses and mastoid air cells are clear. Other: None. IMPRESSION: 1. 7 mm subacute right cerebellar infarct. 2. No acute infarct or other acute intracranial abnormality. 3. Mild chronic small vessel ischemic disease and moderate cerebral atrophy. Electronically Signed   By: Logan Bores M.D.   On: 10/23/2017 14:54     ASSESSMENT AND PLAN:    83 year old male with PAF on anticoagulation, diabetes and peripheral vascular disease with left BKA who presented May 25 with hyperglycemia and since admission has had several complications.  1.  Non-STEMI: Patient is poor candidate for invasive work-up as per cardiology Continue medical management with aspirin, statin, metoprolol Troponin max  17.51  2.  Metabolic encephalopathy in the setting of 7 mm subacute right cerebral infarct: Continue aspirin, Xarelto and statin Await neurology recommendations further work-up Risperdal at night 3.  Acute on chronic systolic heart failure EF 20 to 25%: Continue IV Lasix and follow BMP  4.  PAF: Continue digoxin, diltiazem and metoprolol (increased dose) for heart rate control Continue Xarelto for anticoagulation   5.  Diabetes: Continue sliding scale with NovoLog and Lantus Diabetes nurse consult requested as blood sugars are not ideal   6.  PAD with ulceration right lower extremity: Plan was for right lower extremity angiogram and possible revascularization however due to CVA this is unlikely  PT evaluation for discharge planning  Management plans discussed with the patient's wife and she is in agreement.  CODE STATUS: DNR  TOTAL TIME TAKING CARE OF THIS PATIENT: 24 minutes.     POSSIBLE D/C 1-2 days, DEPENDING ON CLINICAL CONDITION.   Danashia Landers M.D on 10/24/2017 at 11:52 AM  Between 7am to 6pm - Pager - [phone] After 6pm go to [email or web address] - password [email or web address] [facility]  Office  [phone]  CC: Primary care physician; Alanson Aly, FNP  Note: This dictation was prepared with Dragon dictation along with smaller phrase technology. Any transcriptional errors that result from this process  are unintentional.

## 2017-10-24 NOTE — Progress Notes (Signed)
Pt continues to be agitated, HR up to 170 at times. Gave 1x dose of PRN lopressor IV, did not make a difference. Treated pt with 1x ativan PRN and did not help. Pt continues taking c-pap off, sitter at bedside.

## 2017-10-24 NOTE — NC FL2 (Signed)
[place] LEVEL OF CARE SCREENING TOOL     IDENTIFICATION  Patient Name: [name] Birthdate: 09/02/1934 Sex: male Admission Date (Current Location): 10/14/2017  [place] and [place] Number:  Engineering geologist and Address:  [facility], [address] [place], [place] [address]      Provider Number: [id number]  Attending Physician Name and Address:  Bettey Costa, MD  Relative Name and Phone Number:  Aquarius, [place] [phone]  [phone]     Current Level of Care: Hospital Recommended Level of Care: [place] Prior Approval Number:    Date Approved/Denied:   PASRR Number: [id number]  Discharge Plan: SNF    Current Diagnoses: Patient Active Problem List   Diagnosis Date Noted  . Atrial fibrillation with rapid ventricular response ([place]) 10/15/2017  . BKA stump complication ([place]) 09/38/1829  . 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 S/P BKA (below knee amputation) unilateral, left ([place]) 02/01/2017  . Chronic diastolic heart failure ([place]) 01/21/2017  . Pressure injury of skin 01/18/2017  . 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 Diabetic foot ulcer ([place]) 12/29/2016  . Ataxia 10/21/2016  . History of femoral angiogram 09/06/2016  . Left leg pain 09/06/2016  . Leukocytosis 09/06/2016  . Generalized weakness 09/06/2016  . A-fib ([place]) 08/30/2016  . Pneumonia 05/19/2016  . Hyperlipidemia 04/20/2016  . Back pain 04/19/2016  . Type 2 diabetes mellitus treated with insulin ([place]) 04/19/2016  . Essential hypertension 04/19/2016  . PAD (peripheral artery disease) ([place]) 04/19/2016  . Erectile dysfunction following radical prostatectomy 06/25/2015  . History of prostate cancer 12/23/2014  . Incontinence 12/23/2014    Orientation RESPIRATION BLADDER Height & Weight     Self  Normal Continent Weight: 178 lb (80.7 kg) Height:  5'  7" (170.2 cm)  BEHAVIORAL SYMPTOMS/MOOD NEUROLOGICAL BOWEL NUTRITION STATUS      Continent Diet(Cardiac)  AMBULATORY STATUS COMMUNICATION OF NEEDS Skin   Limited Assist Verbally Surgical wounds                       Personal Care Assistance Level of Assistance  Bathing, Feeding, Dressing Bathing Assistance: Limited assistance Feeding assistance: Limited assistance Dressing Assistance: Limited assistance     Functional Limitations Info  Sight, Hearing, Speech Sight Info: Adequate Hearing Info: Adequate Speech Info: Adequate    SPECIAL CARE FACTORS FREQUENCY  PT (By licensed PT)     PT Frequency: 5x a week              Contractures Contractures Info: Not present    Additional Factors Info  Code Status, Allergies, Insulin Sliding Scale, Psychotropic Code Status Info: DNR Allergies Info: CONTRAST MEDIA IODINATED DIAGNOSTIC AGENTS, IOHEXOL, METRIZAMIDE, LATEX Psychotropic Info: risperiDONE (RISPERDAL) tablet 0.5 mg  Insulin Sliding Scale Info: insulin aspart (novoLOG) injection 0-15 Units 3x a day with meals       Current Medications (10/24/2017):  This is the current hospital active medication list Current Facility-Administered Medications  Medication Dose Route Frequency Provider Last Rate Last Dose  . acetaminophen (TYLENOL) tablet 650 mg  650 mg Oral Q4H PRN Arta Silence, MD   650 mg at 10/17/17 0655  . aspirin chewable tablet 81 mg  81 mg Oral Daily Lafayette Dragon, MD   81 mg at 10/24/17 0919  . atorvastatin (LIPITOR) tablet 80 mg  80 mg Oral q1800 Arta Silence, MD   80 mg at 10/20/17  1654  . bisacodyl (DULCOLAX) EC tablet 5 mg  5 mg Oral Daily PRN Arta Silence, MD      . collagenase (SANTYL) ointment   Topical Daily Vaughan Basta, MD      . dextrose 50 % solution 25 mL  25 mL Intravenous PRN Tukov-Yual, Magdalene S, NP      . digoxin (LANOXIN) tablet 0.125 mg  0.125 mg Oral Daily Arta Silence, MD   0.125 mg at 10/24/17  0919  . diltiazem (CARDIZEM CD) 24 hr capsule 300 mg  300 mg Oral Daily Arta Silence, MD   300 mg at 10/24/17 0919  . famotidine (PEPCID) tablet 20 mg  20 mg Oral BID Vaughan Basta, MD   20 mg at 10/24/17 0920  . furosemide (LASIX) injection 40 mg  40 mg Intravenous Daily Lafayette Dragon, MD   40 mg at 10/24/17 0920  . haloperidol lactate (HALDOL) injection 0.5 mg  0.5 mg Intravenous Q6H PRN Vaughan Basta, MD   0.5 mg at 10/23/17 0126  . haloperidol lactate (HALDOL) injection 1 mg  1 mg Intramuscular Once Lance Coon, MD      . insulin aspart (novoLOG) injection 0-15 Units  0-15 Units Subcutaneous TID WC Vaughan Basta, MD   15 Units at 10/24/17 1204  . insulin aspart (novoLOG) injection 0-5 Units  0-5 Units Subcutaneous QHS Vaughan Basta, MD   2 Units at 10/22/17 0140  . insulin aspart (novoLOG) injection 4 Units  4 Units Subcutaneous TID WC Vaughan Basta, MD   4 Units at 10/24/17 1204  . insulin glargine (LANTUS) injection 15 Units  15 Units Subcutaneous QHS Lafayette Dragon, MD   15 Units at 10/23/17 2230  . lamoTRIgine (LAMICTAL) tablet 100 mg  100 mg Oral BID Arta Silence, MD   100 mg at 10/24/17 0919  . LORazepam (ATIVAN) injection 1 mg  1 mg Intravenous Q6H PRN Vaughan Basta, MD   1 mg at 10/24/17 1513  . MEDLINE mouth rinse  15 mL Mouth Rinse BID Lafayette Dragon, MD   15 mL at 10/24/17 1205  . [START ON 10/25/2017] metoprolol succinate (TOPROL-XL) 24 hr tablet 150 mg  150 mg Oral Daily Mody, Sital, MD      . metoprolol tartrate (LOPRESSOR) injection 2.5 mg  2.5 mg Intravenous Q6H PRN Arta Silence, MD   2.5 mg at 10/24/17 0453  . ondansetron (ZOFRAN) injection 4 mg  4 mg Intravenous Q6H PRN Arta Silence, MD      . polyethylene glycol (MIRALAX / GLYCOLAX) packet 17 g  17 g Oral Daily Vaughan Basta, MD   Stopped at 10/23/17 1037  . potassium chloride SA (K-DUR,KLOR-CON) CR tablet 20 mEq  20 mEq Oral  BID Vaughan Basta, MD   20 mEq at 10/24/17 0920  . risperiDONE (RISPERDAL) tablet 0.5 mg  0.5 mg Oral QHS Arta Silence, MD   0.5 mg at 10/23/17 2233  . rivaroxaban (XARELTO) tablet 20 mg  20 mg Oral Q supper Vaughan Basta, MD   20 mg at 10/22/17 2207  . senna-docusate (Senokot-S) tablet 1 tablet  1 tablet Oral QHS PRN Arta Silence, MD   1 tablet at 10/20/17 1529  . senna-docusate (Senokot-S) tablet 1 tablet  1 tablet Oral Daily Vaughan Basta, MD   1 tablet at 10/24/17 0919  . sodium chloride flush (NS) 0.9 % injection 3 mL  3 mL Intravenous Q12H Vaughan Basta, MD   3 mL at 10/24/17 0917  . ziprasidone (GEODON) injection 10 mg  10 mg Intramuscular Once Lance Coon, MD       Facility-Administered Medications Ordered in Other Encounters  Medication Dose Route Frequency Provider Last Rate Last Dose  . acetaminophen (TYLENOL) tablet 650 mg  650 mg Oral Q6H PRN Stegmayer, Kimberly A, PA-C       Or  . acetaminophen (TYLENOL) suppository 650 mg  650 mg Rectal Q6H PRN Stegmayer, Kimberly A, PA-C      . ondansetron (ZOFRAN) tablet 4 mg  4 mg Oral Q6H PRN Stegmayer, Kimberly A, PA-C       Or  . ondansetron (ZOFRAN) 4 mg in sodium chloride 0.9 % 50 mL IVPB  4 mg Intravenous Q6H PRN Stegmayer, Janalyn Harder, PA-C         Discharge Medications: Please see discharge summary for a list of discharge medications.  Relevant Imaging Results:  Relevant Lab Results:   Additional Information SSN [id number]  Ross Ludwig, [place]

## 2017-10-25 ENCOUNTER — Inpatient Hospital Stay: Payer: Medicare Other

## 2017-10-25 DIAGNOSIS — I70238 Atherosclerosis of native arteries of right leg with ulceration of other part of lower right leg: Secondary | ICD-10-CM

## 2017-10-25 DIAGNOSIS — I70269 Atherosclerosis of native arteries of extremities with gangrene, unspecified extremity: Secondary | ICD-10-CM

## 2017-10-25 DIAGNOSIS — I639 Cerebral infarction, unspecified: Secondary | ICD-10-CM

## 2017-10-25 LAB — LIPID PANEL
CHOL/HDL RATIO: 3.1 ratio
CHOLESTEROL: 122 mg/dL (ref 0–200)
HDL: 39 mg/dL — ABNORMAL LOW (ref >40)
LDL Cholesterol: 70 mg/dL (ref 0–99)
TRIGLYCERIDES: 65 mg/dL (ref <150)
VLDL: 13 mg/dL (ref 0–40)

## 2017-10-25 LAB — GLUCOSE, CAPILLARY
GLUCOSE-CAPILLARY: 127 mg/dL — AB (ref 65–99)
GLUCOSE-CAPILLARY: 100 mg/dL — AB (ref 65–99)
GLUCOSE-CAPILLARY: 226 mg/dL — AB (ref 65–99)
Glucose-Capillary: 166 mg/dL — ABNORMAL HIGH (ref 65–99)

## 2017-10-25 LAB — LAMOTRIGINE LEVEL: Lamotrigine Lvl: 3.9 ug/mL (ref 2.0–20.0)

## 2017-10-25 LAB — HEMOGLOBIN A1C
HEMOGLOBIN A1C: 11.9 % — AB (ref 4.8–5.6)
Mean Plasma Glucose: 294.83 mg/dL

## 2017-10-25 MED ORDER — PREMIER PROTEIN SHAKE
11.0000 [oz_av] | Freq: Two times a day (BID) | ORAL | Status: DC
  Administered 2017-10-25 – 2017-10-28 (×4): 11 [oz_av] via ORAL

## 2017-10-25 MED ORDER — OCUVITE-LUTEIN PO CAPS
1.0000 | ORAL_CAPSULE | Freq: Every day | ORAL | Status: DC
  Administered 2017-10-25 – 2017-10-28 (×4): 1 via ORAL
  Filled 2017-10-25 (×5): qty 1

## 2017-10-25 NOTE — Progress Notes (Addendum)
Per MD Mody, verbal orders with read back to discontinue safety sitter. Patient still asleep this am but arousable. Tele sitter initiated.Wife at bedside, will continue to monitor patient. Patient on low bed.

## 2017-10-25 NOTE — Progress Notes (Signed)
Initial Nutrition Assessment  DOCUMENTATION CODES:   Obesity unspecified  INTERVENTION:   Premier Protein BID, each supplement provides 160 kcal and 30 grams of protein.   Magic cup TID with meals, each supplement provides 290 kcal and 9 grams of protein  Ocuvite daily for wound healing (provides zinc, vitamin A, vitamin C, Vitamin E, copper, and selenium)  Dysphagia 3 diet   NUTRITION DIAGNOSIS:   Inadequate oral intake related to acute illness(AMS, CVA) as evidenced by other (comment)(Per sitter at bedside ).  GOAL:   Patient will meet greater than or equal to 90% of their needs  MONITOR:   PO intake, Supplement acceptance, Labs, Weight trends, I & O's, Skin  REASON FOR ASSESSMENT:   LOS    ASSESSMENT:   83 year old male with PAF on anticoagulation, diabetes and peripheral vascular disease with left BKA who presented May 25 with hyperglycemia and suffered complications including CVA and NSTEMI.    Visited pt's room today. Unable to speak with pt r/t AMS. Spoke with sitter at bedside who reports that pt has not eaten anything today as he had been sedated and was resting. Per chart, pt eating 75-100% of meals yesterday. Pt with multiple wt fluctuations but appears to be weight stable pta. Pt with severe wasting in his BLE but pt is wheelchair bound at baseline. RD will order supplements and ocuvite to encourage wound healing.   Medications reviewed and include: aspirin, pepcid, insulin, miralax, KCl, senokot, geodon  Labs reviewed: cbgs- 418, 311, 101, 110, 166 x 24 hrs AIC- 11.9(H)- 6/4  NUTRITION - FOCUSED PHYSICAL EXAM:    Most Recent Value  Orbital Region  No depletion  Upper Arm Region  No depletion  Thoracic and Lumbar Region  No depletion  Buccal Region  No depletion  Temple Region  No depletion  Clavicle Bone Region  No depletion  Clavicle and Acromion Bone Region  No depletion  Scapular Bone Region  No depletion  Dorsal Hand  No depletion  Patellar  Region  Severe depletion  Anterior Thigh Region  Severe depletion  Posterior Calf Region  Severe depletion  Edema (RD Assessment)  Mild  Hair  Reviewed  Eyes  Reviewed  Mouth  Reviewed  Skin  Reviewed  Nails  Reviewed     Diet Order:   Diet Order           Diet heart healthy/carb modified Room service appropriate? Yes; Fluid consistency: Thin  Diet effective now         EDUCATION NEEDS:   Not appropriate for education at this time  Skin:  Skin Assessment: Reviewed RN Assessment(Wounds R leg, skin tear arm)  Last BM:  6/3- TYPE 5  Height:   Ht Readings from Last 1 Encounters:  10/20/17 5\' 7"  (1.702 m)    Weight:   Wt Readings from Last 1 Encounters:  10/25/17 192 lb (87.1 kg)    Ideal Body Weight:  60.7 kg(adjusted for L BKA)  BMI:  Body mass index is 30.07 kg/m.  Estimated Nutritional Needs:   Kcal:  1800-2100kcal/day   Protein:  87-104g/day   Fluid:  >1.6L/day   Koleen Distance MS, RD, LDN Pager #- [phone] Office#- [phone] After Hours Pager: [phone]

## 2017-10-25 NOTE — Progress Notes (Signed)
[facility]  Daily Progress Note   Subjective  - 4 Days Post-Op  Patient with less agitation now but had to be sedated earlier. Foot not currently hurting   Objective Vitals:   10/25/17 0519 10/25/17 0949 10/25/17 1207 10/25/17 1650  BP: (!) 134/100 (!) 125/94 136/89 (!) 141/85  Pulse: (!) 108 86 68 71  Resp: 18 18 18 17   Temp: 97.7 F (36.5 C)   97.9 F (36.6 C)  TempSrc: Oral   Oral  SpO2: 98% 95%  97%  Weight:      Height:        Intake/Output Summary (Last 24 hours) at 10/25/2017 1739 Last data filed at 10/25/2017 1214 Gross per 24 hour  Intake 483 ml  Output 850 ml  Net -367 ml    PULM  CTAB CV  irregular VASC  Stable ulceration right foot, foot warm  Laboratory CBC    Component Value Date/Time   WBC 10.8 (H) 10/24/2017 0454   HGB 15.7 10/24/2017 0454   HGB 16.3 01/20/2013 0255   HCT 46.3 10/24/2017 0454   HCT 47.9 01/20/2013 0255   PLT 241 10/24/2017 0454   PLT 245 01/20/2013 0255    BMET    Component Value Date/Time   NA 139 10/24/2017 0454   NA 140 01/20/2013 0255   K 3.7 10/24/2017 0454   K 3.5 01/20/2013 0255   CL 106 10/24/2017 0454   CL 103 01/20/2013 0255   CO2 23 10/24/2017 0454   CO2 30 01/20/2013 0255   GLUCOSE 94 10/24/2017 0454   GLUCOSE 145 (H) 01/20/2013 0255   BUN 29 (H) 10/24/2017 0454   BUN 22 (H) 01/20/2013 0255   CREATININE 0.93 10/24/2017 0454   CREATININE 1.09 01/20/2013 0255   CALCIUM 8.1 (L) 10/24/2017 0454   CALCIUM 9.4 01/20/2013 0255   GFRNONAA >60 10/24/2017 0454   GFRNONAA >60 01/20/2013 0255   GFRAA >60 10/24/2017 0454   GFRAA >60 01/20/2013 0255    Assessment/Planning:   PAD with ulceration RLE  Difficult situation.    Angiogram requires anesthesia which would increase risk of cardiac issues or further neurologic issues  No angiogram would increase risk of limb loss  Will try to discuss with palliative and possibly wife tomorrow    Leotis Pain  10/25/2017, 5:39 PM

## 2017-10-25 NOTE — Progress Notes (Signed)
Physical Therapy Treatment Patient Details Name: [name] MRN: [id number] DOB: 1934/06/25 Today's Date: 10/25/2017    History of Present Illness presented to ER secondary to tachycardia, hyperglycemia; admitted for management of afib with RVR.  PAtient noted with ulceration to R distal LE; attempted angiogram during hospitalization, cancelled due to patient agitation.    PT Comments    Increased lethargy, difficulty following commands this date (per RN, received meds to calm/sedate overnight).  More pronounced L lateral lean in sitting and standing noted throughout session; absent awareness, attempts at self-correction noted.  Unable to safely participate with gait/mobility away from bed surface this date due to increased confusion, decreased balance. Question some degree of R visual field deficit; difficult to fully assess due to increased confusion this date.    Follow Up Recommendations  SNF     Equipment Recommendations       Recommendations for Other Services       Precautions / Restrictions Precautions Precautions: Fall Restrictions Weight Bearing Restrictions: No    Mobility  Bed Mobility Overal bed mobility: Needs Assistance Bed Mobility: Supine to Sit;Sit to Supine     Supine to sit: Mod assist Sit to supine: Mod assist;Max assist      Transfers Overall transfer level: Needs assistance Equipment used: Rolling walker (2 wheeled) Transfers: Sit to/from Stand Sit to Stand: Min assist;Mod assist         General transfer comment: consistently pulling on RW  Ambulation/Gait             General Gait Details: unsafe/unable this date   Stairs             Wheelchair Mobility    Modified Rankin (Stroke Patients Only)       Balance Overall balance assessment: Needs assistance   Sitting balance-Leahy Scale: Fair Sitting balance - Comments: lists posteriorally with dynamic activities/weight shifting   Standing balance support:  Bilateral upper extremity supported Standing balance-Leahy Scale: Fair                              Cognition Arousal/Alertness: Lethargic Behavior During Therapy: Impulsive Overall Cognitive Status: Impaired/Different from baseline                                 General Comments: inconsistent command following; poor insight/safety awareness      Exercises Other Exercises Other Exercises: Sit/stand x3 with RW, min/mod assist-more pronounced L latearl lean, worsens with fatigue or divided attention/dynamic tasks.  Max cuing, manual facilitation for correction; absent awareness, absent attempts at self-correction Other Exercises: Attempted dynamic reaching in stance to facilitate postural extension and improved midline in A/P plane; unable to follow commands for full participation during more intensive therex requiring divided attention Other Exercises: High-sitting edge of bed, participated with alternate UE reaching activities to promote sitting balance, midline orientation.  Step by step cuing required for task comprehension and participation.  Fatigues quickly. Other Exercises: Visual scanning activities in unsupported sitting-suspect some degree of R visual field deficit; difficult to fully assess/interpret due to inconsistent cognition and commands following.  Will continue assessment as appropriate.    General Comments        Pertinent Vitals/Pain Pain Assessment: Faces Faces Pain Scale: No hurt    Home Living  Prior Function            PT Goals (current goals can now be found in the care plan section) Acute Rehab PT Goals Patient Stated Goal: to get out of the bed PT Goal Formulation: With patient/family Time For Goal Achievement: 11/07/17 Potential to Achieve Goals: Fair Progress towards PT goals: Not progressing toward goals - comment(increased lethargy, difficulty following commands this date)     Frequency    Min 2X/week      PT Plan Current plan remains appropriate    Co-evaluation              AM-PAC PT "6 Clicks" Daily Activity  Outcome Measure  Difficulty turning over in bed (including adjusting bedclothes, sheets and blankets)?: Unable Difficulty moving from lying on back to sitting on the side of the bed? : Unable Difficulty sitting down on and standing up from a chair with arms (e.g., wheelchair, bedside commode, etc,.)?: Unable Help needed moving to and from a bed to chair (including a wheelchair)?: Total Help needed walking in hospital room?: Total Help needed climbing 3-5 steps with a railing? : Total 6 Click Score: 6    End of Session Equipment Utilized During Treatment: Gait belt Activity Tolerance: Patient limited by lethargy Patient left: in bed;with call bell/phone within reach;with bed alarm set;with nursing/sitter in room   PT Visit Diagnosis: Unsteadiness on feet (R26.81);Muscle weakness (generalized) (M62.81);Difficulty in walking, not elsewhere classified (R26.2)     Time: 1540-1620 PT Time Calculation (min) (ACUTE ONLY): 40 min  Charges:  $Therapeutic Activity: 23-37 mins $Neuromuscular Re-education: 8-22 mins                    G Codes:       Chaelyn Bunyan H. Owens Shark, PT, DPT, NCS 10/25/17, 10:15 PM [phone]

## 2017-10-25 NOTE — Plan of Care (Signed)
  Problem: Education: Goal: Knowledge of secondary prevention will improve Outcome: Not Progressing Note:  Patient confused. Alert to only self. Currently no family at beside. Will educate family once at bedside.

## 2017-10-25 NOTE — Progress Notes (Signed)
[facility] [place] NAME: [name]    MR#:  [id number]  DATE OF BIRTH:  05/11/1935  SUBJECTIVE:   Daughters at bedside.  No acute events overnight.  Patient is currently sleeping.  REVIEW OF SYSTEMS:    Unable to obtain as patient is sleeping  Tolerating Diet: yes      DRUG ALLERGIES:   Allergies  Allergen Reactions  . Contrast Media [Iodinated Diagnostic Agents] Shortness Of Breath    SOB  . Iohexol Shortness Of Breath     Desc: Respiratory Distress, Laryngedema, diaphoresis, Onset Date: [id number]   . Metrizamide Shortness Of Breath    SOB SOB SOB   . Latex Rash    VITALS:  Blood pressure (!) 125/94, pulse 86, temperature 97.7 F (36.5 C), temperature source Oral, resp. rate 18, height 5\' 7"  (1.702 m), weight 87.1 kg (192 lb), SpO2 95 %.  PHYSICAL EXAMINATION:  Constitutional: Critically ill appearing no distress. HENT: Normocephalic. Eyes: Conjunctivae  are normal. no scleral icterus.  Neck: . Neck supple. No JVD. No tracheal deviation. CVS: RRR, S1/S2 +, no murmurs, no gallops, no carotid bruit.  Pulmonary: Effort and breath sounds normal, no stridor, rhonchi, wheezes, rales.  Abdominal: Soft. BS +,  no distension, tenderness, rebound or guarding.  Musculoskeletal: Normal range of motion. No edema and no tenderness.  Neuro: Sleeping left BKA  skin: Right leg with bandage  psychiatric: Currently sleeping   LABORATORY PANEL:   CBC Recent Labs  Lab 10/24/17 0454  WBC 10.8*  HGB 15.7  HCT 46.3  PLT 241   ------------------------------------------------------------------------------------------------------------------  Chemistries  Recent Labs  Lab 10/20/17 1133  10/24/17 0454  NA  --    < > 139  K  --    < > 3.7  CL  --    < > 106  CO2  --    < > 23  GLUCOSE  --    < > 94  BUN  --    < > 29*  CREATININE  --    < > 0.93  CALCIUM  --    < > 8.1*  MG 1.9  --   --   AST  --    < > 32  ALT   --    < > 34  ALKPHOS  --    < > 167*  BILITOT  --    < > 1.2   < > = values in this interval not displayed.   ------------------------------------------------------------------------------------------------------------------  Cardiac Enzymes No results for input(s): TROPONINI in the last 168 hours. ------------------------------------------------------------------------------------------------------------------  RADIOLOGY:  Dg Chest 1 View  Result Date: 10/23/2017 CLINICAL DATA:  Elevated white blood cell count. The patient is unresponsive. EXAM: CHEST  1 VIEW COMPARISON:  Single-view of the chest 10/19/2017 and 10/18/2016. FINDINGS: There is cardiomegaly. Aeration is improved over the past 2 days. No consolidative process or pneumothorax. Likely trace right pleural effusion. IMPRESSION: Improving aeration over the past 2 days.  No new abnormality. Cardiomegaly. Electronically Signed   By: Inge Rise M.D.   On: 10/23/2017 13:44   Mr Jeri Cos JJ Contrast  Result Date: 10/23/2017 CLINICAL DATA:  In cephalopathy. EXAM: MRI HEAD WITHOUT AND WITH CONTRAST TECHNIQUE: Multiplanar, multiecho pulse sequences of the brain and surrounding structures were obtained without and with intravenous contrast. CONTRAST:  41mL MULTIHANCE GADOBENATE DIMEGLUMINE 529 MG/ML IV SOLN COMPARISON:  Head CT 10/23/2017 and MRI 10/22/2016 FINDINGS: Brain: There is a 7 mm  focus of hyperintense trace diffusion signal in the right cerebellar hemisphere without significantly reduced ADC and with increased T2/FLAIR signal and enhancement most compatible with a subacute infarct. No restricted diffusion is seen elsewhere to indicate a more acute infarct. A tiny chronic infarct in the left cerebellum is unchanged from the prior MRI. Periventricular white matter T2 hyperintensities are nonspecific but compatible with mild chronic small vessel ischemic disease, less prominent than on the prior MRI though this may be at least partly due  to differences in technique. No abnormal supratentorial enhancement is identified. There is moderate cerebral atrophy. No mass, midline shift, intracranial hemorrhage, or extra-axial fluid collection is seen. Vascular: Major intracranial vascular flow voids are preserved. Skull and upper cervical spine: Unremarkable bone marrow signal. Sinuses/Orbits: Bilateral cataract extraction. Paranasal sinuses and mastoid air cells are clear. Other: None. IMPRESSION: 1. 7 mm subacute right cerebellar infarct. 2. No acute infarct or other acute intracranial abnormality. 3. Mild chronic small vessel ischemic disease and moderate cerebral atrophy. Electronically Signed   By: Logan Bores M.D.   On: 10/23/2017 14:54   US Carotid Bilateral  Result Date: 10/25/2017 CLINICAL DATA:  Stroke EXAM: BILATERAL CAROTID DUPLEX ULTRASOUND TECHNIQUE: Pearline Cables scale imaging, color Doppler and duplex ultrasound were performed of bilateral carotid and vertebral arteries in the neck. COMPARISON:  None. FINDINGS: Criteria: Quantification of carotid stenosis is based on velocity parameters that correlate the residual internal carotid diameter with NASCET-based stenosis levels, using the diameter of the distal internal carotid lumen as the denominator for stenosis measurement. The following velocity measurements were obtained: RIGHT ICA:  54 cm/sec CCA:  84 cm/sec SYSTOLIC ICA/CCA RATIO:  0.6 DIASTOLIC ICA/CCA RATIO: ECA:  76 cm/sec LEFT ICA:  60 cm/sec CCA:  70 cm/sec SYSTOLIC ICA/CCA RATIO:  0.9 DIASTOLIC ICA/CCA RATIO: ECA:  81 cm/sec RIGHT CAROTID ARTERY: Mild mixed plaque in the bulb. Low resistance internal carotid Doppler pattern is preserved. There is scattered calcified plaque in the lower and mid internal carotid artery. RIGHT VERTEBRAL ARTERY:  Antegrade. LEFT CAROTID ARTERY: Mild focal smooth mixed plaque in the bulb. Low resistance internal carotid Doppler pattern is preserved. There is calcified plaque extending from the bulb into the  lower internal carotid artery. LEFT VERTEBRAL ARTERY:  Antegrade. IMPRESSION: Less than 50% stenosis in the right and left internal carotid arteries. Electronically Signed   By: Marybelle Killings M.D.   On: 10/25/2017 09:04     ASSESSMENT AND PLAN:    84 year old male with PAF on anticoagulation, diabetes and peripheral vascular disease with left BKA who presented May 25 with hyperglycemia and since admission has had several complications.  1.  Non-STEMI: Patient is poor candidate for invasive work-up as per cardiology Continue medical management with aspirin, statin, metoprolol Troponin max 17.51  2.  Metabolic encephalopathy in the setting of 7 mm subacute right cerebral infarct: Continue aspirin, Xarelto and statin Echocardiogram shows EF 20 to 25% with akinesis of apical myocardium Carotid Doppler showed no hemodynamically significant stenosis  3.  Acute on chronic systolic heart failure EF 20 to 25%: BMP for am Stop IV Lasix today  4.  PAF: Continue digoxin, diltiazem and metoprolol  for heart rate control Continue Xarelto for anticoagulation   5.  Diabetes: Continue sliding scale with NovoLog and Lantus    6.  PAD with ulceration right lower extremity: Plan was for right lower extremity angiogram and possible revascularization however due to CVA he is at very high risk for anesthesia. Plan for Thursday if wife  agrees.  PT evaluation recs snf  Management plans discussed with the patient's wife and she is in agreement.  CODE STATUS: DNR  TOTAL TIME TAKING CARE OF THIS PATIENT: 24 minutes.     POSSIBLE D/C ??? DEPENDING ON CLINICAL CONDITION.   Sherol Sabas M.D on 10/25/2017 at 10:05 AM  Between 7am to 6pm - Pager - [phone] After 6pm go to [email or web address] - password [email or web address] [email or web address]  Office  [phone]  CC: Primary care physician; Alanson Aly, FNP  Note: This dictation was prepared with Dragon dictation along with smaller phrase  technology. Any transcriptional errors that result from this process are unintentional.

## 2017-10-25 NOTE — Progress Notes (Signed)
Daily Progress Note   Patient Name: [name]       Date: 10/25/2017 DOB: 1934-05-26  Age: 83 y.o. MRN#: [id number] Attending Physician: Bettey Costa, MD Primary Care Physician: Alanson Aly, FNP Admit Date: 10/14/2017  Reason for Consultation/Follow-up: Establishing goals of care  Subjective: Patient is lethargic. He does not arouse much to touch or to name. Wife and sitter is at the bedside for support. Patient was agitated throughout the night according to staff and did not sleep much. Spoke with wife and son, as requested to follow-up today by wife. We re-discussed his current status and trajectory. Both wife and son reports at this time they would like to allow [name] the opportunity to participate in rehab at a facility, with hopes of regaining some strength and possibly improve his mental state somewhat. Wife has some health concerns of her own at this time and can not safely care for him in the home in his current state. Son agrees and feels that rehab would be the best for him at this time with the support of outpatient palliative services. Both wife and son feels that after conversations with medical providers, they are unsure if he should undergo the angiogram which was rescheduled due to his mental state. They both are in agreement if he has not shown signs of improvement (mentally) they would air on the side of not performing the angiogram. They verbalized they will rely on the vascular surgeon for guidance with this decision.   Chart Reviewed.   Length of Stay: 10  Current Medications: Scheduled Meds:  . aspirin  81 mg Oral Daily  . atorvastatin  80 mg Oral q1800  . collagenase   Topical Daily  . digoxin  0.125 mg Oral Daily  . diltiazem  300 mg Oral Daily  .  famotidine  20 mg Oral BID  . haloperidol lactate  1 mg Intramuscular Once  . insulin aspart  0-15 Units Subcutaneous TID WC  . insulin aspart  0-5 Units Subcutaneous QHS  . insulin aspart  4 Units Subcutaneous TID WC  . insulin glargine  15 Units Subcutaneous QHS  . lamoTRIgine  100 mg Oral BID  . mouth rinse  15 mL Mouth Rinse BID  . metoprolol succinate  150 mg Oral Daily  . polyethylene glycol  17  g Oral Daily  . potassium chloride  20 mEq Oral BID  . risperiDONE  0.5 mg Oral QHS  . rivaroxaban  20 mg Oral Q supper  . senna-docusate  1 tablet Oral Daily  . sodium chloride flush  3 mL Intravenous Q12H  . ziprasidone  10 mg Intramuscular Once    Continuous Infusions:   PRN Meds: acetaminophen, bisacodyl, dextrose, haloperidol lactate, LORazepam, metoprolol tartrate, ondansetron (ZOFRAN) IV, senna-docusate  Physical Exam  Constitutional: He appears well-developed. He has a sickly appearance.  Cardiovascular: Regular rhythm and normal heart sounds. Tachycardia present. Exam reveals decreased pulses.  2 pedal   Pulmonary/Chest: Effort normal. He has decreased breath sounds.  Abdominal: Soft. Normal appearance.  Musculoskeletal:  Generalized weakness, left leg amputation w/prosthesis   Neurological: He is alert.  Skin:  Right leg ulceration   Psychiatric: Cognition and memory are impaired. He expresses inappropriate judgment.  Confusion   Nursing note and vitals reviewed.           Vital Signs: BP (!) 125/94 (BP Location: Left Arm)   Pulse 86   Temp 97.7 F (36.5 C) (Oral)   Resp 18   Ht 5\' 7"  (1.702 m)   Wt 87.1 kg (192 lb)   SpO2 95%   BMI 30.07 kg/m  SpO2: SpO2: 95 % O2 Device: O2 Device: Room Air O2 Flow Rate: O2 Flow Rate (L/min): 7.5 L/min  Intake/output summary:   Intake/Output Summary (Last 24 hours) at 10/25/2017 1101 Last data filed at 10/25/2017 0400 Gross per 24 hour  Intake 840 ml  Output 1475 ml  Net -635 ml   LBM: Last BM Date:  10/22/17 Baseline Weight: Weight: 81.6 kg (180 lb) Most recent weight: Weight: 87.1 kg (192 lb)       Palliative Assessment/Data:PPS 30%   Flowsheet Rows     Most Recent Value  Intake Tab  Referral Department  Hospitalist  Unit at Time of Referral  Med/Surg Unit  Palliative Care Primary Diagnosis  Cardiac  Date Notified  10/19/17  Palliative Care Type  New Palliative care  Reason for referral  Clarify Goals of Care  Date of Admission  10/14/17  Date first seen by Palliative Care  10/19/17  # of days Palliative referral response time  0 Day(s)  # of days IP prior to Palliative referral  5  Clinical Assessment  Psychosocial & Spiritual Assessment  Palliative Care Outcomes      Patient Active Problem List   Diagnosis Date Noted  . Atrial fibrillation with rapid ventricular response ([place]) 10/15/2017  . BKA stump complication ([place]) 73/53/2992  . Complete below knee amputation of left lower extremity ([place]) 02/10/2017  . S/P BKA (below knee amputation) unilateral, left ([place]) 02/01/2017  . Chronic diastolic heart failure ([place]) 01/21/2017  . Pressure injury of skin 01/18/2017  . 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 Diabetic foot ulcer ([place]) 12/29/2016  . Ataxia 10/21/2016  . History of femoral angiogram 09/06/2016  . Left leg pain 09/06/2016  . Leukocytosis 09/06/2016  . Generalized weakness 09/06/2016  . A-fib ([place]) 08/30/2016  . Pneumonia 05/19/2016  . Hyperlipidemia 04/20/2016  . Back pain 04/19/2016  . Type 2 diabetes mellitus treated with insulin ([place]) 04/19/2016  . Essential hypertension 04/19/2016  . PAD (peripheral artery disease) ([place]) 04/19/2016  . Erectile dysfunction following radical prostatectomy 06/25/2015  . History of prostate cancer 12/23/2014  . Incontinence 12/23/2014    Palliative Care Assessment & Plan   Patient Profile:  [name] a59 y.o.malewith a known history of AF (Xarelto), DM, PVD, T2IDDM, AF  + RVR. Pt w/ L BKA, RLE wound.  Assessment: Out of bed to chair. Confusion and agitation requiring 1:1 sitter. Tachycardia resolving.   Recommendations/Plan: DNR/DNI at wife's request Continue to treat the treatable at family's request. Family (wife and son) are requesting to continue with process of SNF for rehab placement in addition to outpatient palliative services.  Family verbalizes the need for guidance in regards to decision to proceed with angiogram by Vascular Surgeon. At this time they are ok with him not proceeding with procedure given his mental state. Would like to continue discussion with surgeon regarding risk and benefits.  Case Management consult for outpatient palliative services.  Palliative Medicine team will continue to support patient, family, and medical team during hospitalization.   Goals of Care and Additional Recommendations: Limitations on Scope of Treatment: Full Scope Treatment-continue to treat the treatable.   Code Status:    Code Status Orders  (From admission, onward)        Start     Ordered   10/24/17 1152  Do not attempt resuscitation (DNR)  Continuous    Question Answer Comment  In the event of cardiac or respiratory ARREST Do not call a "code blue"   In the event of cardiac or respiratory ARREST Do not perform Intubation, CPR, defibrillation or ACLS   In the event of cardiac or respiratory ARREST Use medication by any route, position, wound care, and other measures to relive pain and suffering. May use oxygen, suction and manual treatment of airway obstruction as needed for comfort.      10/24/17 1152    Code Status History    Date Active Date Inactive Code Status Order ID Comments User Context   10/15/2017 0309 10/24/2017 1152 Full Code [id number]  Arta Silence, MD Inpatient   02/10/2017 1904 02/12/2017 1908 Full Code [id number]  Katha Cabal, MD ED   02/10/2017 1638 02/10/2017 1904 Full Code [id number]  Sela Hua, PA-C  Outpatient   01/08/2017 1342 01/19/2017 2103 Full Code [id number]  Loletha Grayer, MD ED   12/29/2016 2232 12/30/2016 1634 Full Code [id number]  Lance Coon, MD ED   12/20/2016 1251 12/20/2016 1842 Full Code [id number]  Algernon Huxley, MD Inpatient   10/22/2016 0040 10/24/2016 2058 Full Code [id number]  Lance Coon, MD Inpatient   10/14/2016 1201 10/14/2016 1925 Full Code [id number]  Algernon Huxley, MD Inpatient   08/30/2016 1338 09/06/2016 1941 Full Code [id number]  Epifanio Lesches, MD Inpatient   05/19/2016 0454 05/22/2016 1907 Full Code [id number]  Saundra Shelling, MD Inpatient      Prognosis:  Unable to determine-guarded to poor in the setting of confusion, decreased mobility, PVD, hypertension, tachycardia, diabetes, chronic kidney disease, a-fibb, Non-Stemi (not a candidate for invasive work-up) Troponin max 44.31, metabilic encephalopathy with 35mm subacute right cerebral infarct, systolic heart failure (EF 20-25%), and ulcerations to right lower extremity.   Discharge Planning: [facility] for rehab with Palliative care service follow-up  Care plan was discussed with patient's wife, son, Randall Hiss, [place], and Physiological scientist.   Thank you for allowing the Palliative Medicine Team to assist in the care of this patient.   Total Time 35 min.  Prolonged Time Billed  NO      Greater than 50%  of this time was spent counseling and coordinating care related to the above assessment and plan.  Alda Lea, NP-BC Palliative Medicine Team  Phone: [phone]  Fax: [phone]  Please contact Palliative Medicine Team phone at [phone] for questions and concerns.

## 2017-10-25 NOTE — Progress Notes (Addendum)
Subjective: Patient now sedated.   Was agitated and did not sleep overnight.    Objective: Current vital signs: BP 136/89 (BP Location: Left Arm)   Pulse 68   Temp 97.7 F (36.5 C) (Oral)   Resp 18   Ht 5\' 7"  (1.702 m)   Wt 87.1 kg (192 lb)   SpO2 95%   BMI 30.07 kg/m  Vital signs in last 24 hours: Temp:  [97.7 F (36.5 C)-98 F (36.7 C)] 97.7 F (36.5 C) (06/04 0519) Pulse Rate:  [68-108] 68 (06/04 1207) Resp:  [18-19] 18 (06/04 1207) BP: (122-136)/(74-100) 136/89 (06/04 1207) SpO2:  [95 %-100 %] 95 % (06/04 0949) Weight:  [87.1 kg (192 lb)] 87.1 kg (192 lb) (06/04 0500)  Intake/Output from previous day: 06/03 0701 - 06/04 0700 In: 1080 [P.O.:1080] Out: 1475 [Urine:1475] Intake/Output this shift: Total I/O In: 3 [I.V.:3] Out: -  Nutritional status:  Diet Order           Diet heart healthy/carb modified Room service appropriate? Yes; Fluid consistency: Thin  Diet effective now          Neurologic Exam: Mental Status: Obtunded.  When alerted grunts.  Does not follow commands.  No speech. Cranial Nerves: II: Actively fights eye opening III,IV, VI: Actively fights eye opening V,VII: face symmetric VIII: unable to test IX,X: unable to test XI: unable to test XII: unable to test Motor: Moves extremities minimally spontaenously.     Lab Results: Basic Metabolic Panel: Recent Labs  Lab 10/19/17 0659 10/20/17 1133 10/21/17 0627 10/23/17 0640 10/24/17 0454  NA 136  --  137 138 139  K 3.3*  --  3.8 3.9 3.7  CL 106  --  102 102 106  CO2 22  --  26 26 23   GLUCOSE 222*  --  145* 215* 94  BUN 41*  --  26* 33* 29*  CREATININE 1.25*  --  0.92 1.10 0.93  CALCIUM 7.8*  --  8.2* 8.3* 8.1*  MG 1.9 1.9  --   --   --   PHOS 2.7  --   --   --   --     Liver Function Tests: Recent Labs  Lab 10/23/17 0640 10/24/17 0454  AST 30 32  ALT 35 34  ALKPHOS 175* 167*  BILITOT 1.1 1.2  PROT 6.1* 6.1*  ALBUMIN 2.8* 2.8*   No results for input(s): LIPASE, AMYLASE  in the last 168 hours. Recent Labs  Lab 10/22/17 1719  AMMONIA <9*    CBC: Recent Labs  Lab 10/19/17 0659 10/23/17 0640 10/24/17 0454  WBC 10.2 14.2* 10.8*  HGB 13.5 14.7 15.7  HCT 40.4 44.1 46.3  MCV 82.2 82.8 83.0  PLT 196 235 241    Cardiac Enzymes: No results for input(s): CKTOTAL, CKMB, CKMBINDEX, TROPONINI in the last 168 hours.  Lipid Panel: Recent Labs  Lab 10/25/17 0612  CHOL 122  TRIG 65  HDL 39*  CHOLHDL 3.1  VLDL 13  LDLCALC 70    CBG: Recent Labs  Lab 10/24/17 1157 10/24/17 1427 10/24/17 1712 10/24/17 2020 10/25/17 0950  GLUCAP 418* 311* 101* 110* 166*    Microbiology: Results for orders placed or performed during the hospital encounter of 10/14/17  MRSA PCR Screening     Status: None   Collection Time: 10/15/17  3:28 AM  Result Value Ref Range Status   MRSA by PCR NEGATIVE NEGATIVE Final    Comment:        The GeneXpert  MRSA Assay (FDA approved for NASAL specimens only), is one component of a comprehensive MRSA colonization surveillance program. It is not intended to diagnose MRSA infection nor to guide or monitor treatment for MRSA infections. Performed at [facility], [address] [place], [place] [address]   Culture, blood (single) w Reflex to ID Panel     Status: None (Preliminary result)   Collection Time: 10/23/17  3:51 PM  Result Value Ref Range Status   Specimen Description BLOOD LAC  Final   Special Requests   Final    BOTTLES DRAWN AEROBIC AND ANAEROBIC Blood Culture adequate volume   Culture   Final    NO GROWTH 2 DAYS Performed at [facility], [address] [place], [place] [address]    Report Status PENDING  Incomplete    Coagulation Studies: No results for input(s): LABPROT, INR in the last 72 hours.  Imaging: Dg Chest 1 View  Result Date: 10/23/2017 CLINICAL DATA:  Elevated white blood cell count. The patient is unresponsive. EXAM: CHEST  1 VIEW COMPARISON:  Single-view of the  chest 10/19/2017 and 10/18/2016. FINDINGS: There is cardiomegaly. Aeration is improved over the past 2 days. No consolidative process or pneumothorax. Likely trace right pleural effusion. IMPRESSION: Improving aeration over the past 2 days.  No new abnormality. Cardiomegaly. Electronically Signed   By: Inge Rise M.D.   On: 10/23/2017 13:44   Mr Jeri Cos NA Contrast  Result Date: 10/23/2017 CLINICAL DATA:  In cephalopathy. EXAM: MRI HEAD WITHOUT AND WITH CONTRAST TECHNIQUE: Multiplanar, multiecho pulse sequences of the brain and surrounding structures were obtained without and with intravenous contrast. CONTRAST:  81mL MULTIHANCE GADOBENATE DIMEGLUMINE 529 MG/ML IV SOLN COMPARISON:  Head CT 10/23/2017 and MRI 10/22/2016 FINDINGS: Brain: There is a 7 mm focus of hyperintense trace diffusion signal in the right cerebellar hemisphere without significantly reduced ADC and with increased T2/FLAIR signal and enhancement most compatible with a subacute infarct. No restricted diffusion is seen elsewhere to indicate a more acute infarct. A tiny chronic infarct in the left cerebellum is unchanged from the prior MRI. Periventricular white matter T2 hyperintensities are nonspecific but compatible with mild chronic small vessel ischemic disease, less prominent than on the prior MRI though this may be at least partly due to differences in technique. No abnormal supratentorial enhancement is identified. There is moderate cerebral atrophy. No mass, midline shift, intracranial hemorrhage, or extra-axial fluid collection is seen. Vascular: Major intracranial vascular flow voids are preserved. Skull and upper cervical spine: Unremarkable bone marrow signal. Sinuses/Orbits: Bilateral cataract extraction. Paranasal sinuses and mastoid air cells are clear. Other: None. IMPRESSION: 1. 7 mm subacute right cerebellar infarct. 2. No acute infarct or other acute intracranial abnormality. 3. Mild chronic small vessel ischemic disease  and moderate cerebral atrophy. Electronically Signed   By: Logan Bores M.D.   On: 10/23/2017 14:54   US Carotid Bilateral  Result Date: 10/25/2017 CLINICAL DATA:  Stroke EXAM: BILATERAL CAROTID DUPLEX ULTRASOUND TECHNIQUE: Pearline Cables scale imaging, color Doppler and duplex ultrasound were performed of bilateral carotid and vertebral arteries in the neck. COMPARISON:  None. FINDINGS: Criteria: Quantification of carotid stenosis is based on velocity parameters that correlate the residual internal carotid diameter with NASCET-based stenosis levels, using the diameter of the distal internal carotid lumen as the denominator for stenosis measurement. The following velocity measurements were obtained: RIGHT ICA:  54 cm/sec CCA:  84 cm/sec SYSTOLIC ICA/CCA RATIO:  0.6 DIASTOLIC ICA/CCA RATIO: ECA:  76 cm/sec LEFT ICA:  60 cm/sec CCA:  70 cm/sec SYSTOLIC ICA/CCA RATIO:  0.9 DIASTOLIC ICA/CCA RATIO: ECA:  81 cm/sec RIGHT CAROTID ARTERY: Mild mixed plaque in the bulb. Low resistance internal carotid Doppler pattern is preserved. There is scattered calcified plaque in the lower and mid internal carotid artery. RIGHT VERTEBRAL ARTERY:  Antegrade. LEFT CAROTID ARTERY: Mild focal smooth mixed plaque in the bulb. Low resistance internal carotid Doppler pattern is preserved. There is calcified plaque extending from the bulb into the lower internal carotid artery. LEFT VERTEBRAL ARTERY:  Antegrade. IMPRESSION: Less than 50% stenosis in the right and left internal carotid arteries. Electronically Signed   By: Marybelle Killings M.D.   On: 10/25/2017 09:04    Medications:  I have reviewed the patient's current medications. Scheduled: . aspirin  81 mg Oral Daily  . atorvastatin  80 mg Oral q1800  . collagenase   Topical Daily  . digoxin  0.125 mg Oral Daily  . diltiazem  300 mg Oral Daily  . famotidine  20 mg Oral BID  . haloperidol lactate  1 mg Intramuscular Once  . insulin aspart  0-15 Units Subcutaneous TID WC  . insulin  aspart  0-5 Units Subcutaneous QHS  . insulin aspart  4 Units Subcutaneous TID WC  . insulin glargine  15 Units Subcutaneous QHS  . lamoTRIgine  100 mg Oral BID  . mouth rinse  15 mL Mouth Rinse BID  . metoprolol succinate  150 mg Oral Daily  . polyethylene glycol  17 g Oral Daily  . potassium chloride  20 mEq Oral BID  . risperiDONE  0.5 mg Oral QHS  . rivaroxaban  20 mg Oral Q supper  . senna-docusate  1 tablet Oral Daily  . sodium chloride flush  3 mL Intravenous Q12H  . ziprasidone  10 mg Intramuscular Once    Assessment/Plan: Patient currently sedated due to agitation.  Neurological exam very difficult.  Carotid dopplers show no hemodynamically significant stenosis. A1c 11.9.   LDL 70 on a statin.  Recommendations: 1. Blood sugar management with target A1c<7.0 2. If no improvement from current level of sedation as day progresses may require repeat head CT since on anticoagulation.     LOS: 10 days   Alexis Goodell, MD Neurology [phone] 10/25/2017  12:18 PM

## 2017-10-25 NOTE — Progress Notes (Signed)
Tele sitter at bedside, patient still asleep.  Will continue to monitor patient.

## 2017-10-25 NOTE — Progress Notes (Signed)
Patient's bed alarm went off, per other nursing staff patient was trying to get out of bed. Patient was redirected, neither the nurse tech nor I got a call from video monitoring to warn [place] patient was attempting to get out of bed. Video monitoring was called to make sure they were still monitoring patient and to make sure video was not off. Per staff, they are still monitoring patient. Staff stated "I stepped away for a second" . Will continue to closely monitor patient. Wife currently back at bedside.

## 2017-10-25 NOTE — Clinical Social Work Note (Addendum)
CSW spoke to patient's wife and presented bed offers, patient's wife would like him to go to [facility].  CSW contacted [facility] who will begin insurance authorization.  CSW to continue to follow patient's progress throughout discharge planning.  Patient will have to be without tele sitter or sitter for 24 hours before he is able to discharge to SNF.  [name]. [place], MSW, [place]  10/25/2017 12:14 PM

## 2017-10-26 DIAGNOSIS — I70238 Atherosclerosis of native arteries of right leg with ulceration of other part of lower right leg: Secondary | ICD-10-CM

## 2017-10-26 LAB — BASIC METABOLIC PANEL
Anion gap: 9 (ref 5–15)
BUN: 22 mg/dL — AB (ref 6–20)
CHLORIDE: 102 mmol/L (ref 101–111)
CO2: 25 mmol/L (ref 22–32)
CREATININE: 1.09 mg/dL (ref 0.61–1.24)
Calcium: 8.4 mg/dL — ABNORMAL LOW (ref 8.9–10.3)
GFR calc Af Amer: >60 mL/min (ref >60)
GFR calc non Af Amer: >60 mL/min (ref >60)
Glucose, Bld: 117 mg/dL — ABNORMAL HIGH (ref 65–99)
Potassium: 4 mmol/L (ref 3.5–5.1)
SODIUM: 136 mmol/L (ref 135–145)

## 2017-10-26 LAB — CBC
HCT: 46.6 % (ref 40.0–52.0)
Hemoglobin: 16 g/dL (ref 13.0–18.0)
MCH: 28.5 pg (ref 26.0–34.0)
MCHC: 34.3 g/dL (ref 32.0–36.0)
MCV: 82.9 fL (ref 80.0–100.0)
PLATELETS: 265 10*3/uL (ref 150–440)
RBC: 5.63 MIL/uL (ref 4.40–5.90)
RDW: 18.2 % — AB (ref 11.5–14.5)
WBC: 9.9 10*3/uL (ref 3.8–10.6)

## 2017-10-26 LAB — GLUCOSE, CAPILLARY
Glucose-Capillary: 109 mg/dL — ABNORMAL HIGH (ref 65–99)
Glucose-Capillary: 87 mg/dL (ref 65–99)
Glucose-Capillary: 94 mg/dL (ref 65–99)
Glucose-Capillary: 99 mg/dL (ref 65–99)
Glucose-Capillary: 80 mg/dL (ref 65–99)

## 2017-10-26 NOTE — Plan of Care (Signed)
  Problem: Coping: Goal: Level of anxiety will decrease Outcome: Progressing   Problem: Safety: Goal: Ability to remain free from injury will improve Outcome: Progressing Note:  Patient has a Oncologist.

## 2017-10-26 NOTE — Progress Notes (Signed)
Son, daughter, and wife at bedside. Update given and answered all questions regarding patient's care. Will continue to monitor patient.

## 2017-10-26 NOTE — Care Management Important Message (Signed)
Copy of signed IM left in patient's room.    

## 2017-10-26 NOTE — Progress Notes (Signed)
Patient currently has a Oncologist. Patient still asleep but arousable. Will continue to monitor patient.

## 2017-10-26 NOTE — Progress Notes (Signed)
After discussing CODE status with mother and daughter they would liek to change to FULL CODE. Order written

## 2017-10-26 NOTE — Progress Notes (Signed)
OT Cancellation Note  Patient Details Name: [name] MRN: [id number] DOB: 1935-03-07   Cancelled Treatment:    Reason Eval/Treat Not Completed: Fatigue/lethargy limiting ability to participate(Pt. sleeping soundly. Unable to arouse, alert, or attend to session this a.m. Per nursing report, pt. was agitated through the night. Pt. room was changed, and Ativan was given.  Will attempt OT initial visit at a later time, or date when appropriate.)  Harrel Carina, MS, OTR/L 10/26/2017, 9:32 AM

## 2017-10-26 NOTE — Plan of Care (Signed)
  Problem: Safety: Goal: Ability to remain free from injury will improve Outcome: Progressing  Low bed Rounder

## 2017-10-26 NOTE — Progress Notes (Signed)
Upon shift arrival patient did not have a tele sitter, patient had a safety rounder. Per night nursing staff, patient was very agitated and tried getting out of bed multiple times. Tele sitter requested again. Patient currently sleeping with no major issues this am. Will continue to monitor patient.

## 2017-10-26 NOTE — Progress Notes (Addendum)
[facility] [place] NAME: [name]    MR#:  [id number]  DATE OF BIRTH:  [birth year]  SUBJECTIVE:   Patient was awake yesterday however this morning again is sleeping He was agitated through the night and Ativan was given REVIEW OF SYSTEMS:    Unable to obtain as patient is sleeping  Tolerating Diet: yes      DRUG ALLERGIES:   Allergies  Allergen Reactions  . Contrast Media [Iodinated Diagnostic Agents] Shortness Of Breath    SOB  . Iohexol Shortness Of Breath     Desc: Respiratory Distress, Laryngedema, diaphoresis, Onset Date: [id number]   . Metrizamide Shortness Of Breath    SOB SOB SOB   . Latex Rash    VITALS:  Blood pressure (!) 158/90, pulse 91, temperature 97.8 F (36.6 C), temperature source Axillary, resp. rate 18, height 5\' 7"  (1.702 m), weight 88.5 kg (195 lb), SpO2 94 %.  PHYSICAL EXAMINATION:  Constitutional: Critically ill appearing no distress. HENT: Normocephalic. Eyes: Conjunctivae  are normal. no scleral icterus.  Neck: . Neck supple. No JVD. No tracheal deviation. CVS: RRR, S1/S2 +, no murmurs, no gallops, no carotid bruit.  Pulmonary: Effort and breath sounds normal, no stridor, rhonchi, wheezes, rales.  Abdominal: Soft. BS +,  no distension, tenderness, rebound or guarding.  Musculoskeletal: Normal range of motion. No edema and no tenderness.  Neuro: Sleeping left BKA  skin: Right leg with bandage  psychiatric: Currently sleeping   LABORATORY PANEL:   CBC Recent Labs  Lab 10/26/17 0259  WBC 9.9  HGB 16.0  HCT 46.6  PLT 265   ------------------------------------------------------------------------------------------------------------------  Chemistries  Recent Labs  Lab 10/20/17 1133  10/24/17 0454 10/26/17 0259  NA  --    < > 139 136  K  --    < > 3.7 4.0  CL  --    < > 106 102  CO2  --    < > 23 25  GLUCOSE  --    < > 94 117*  BUN  --    < > 29* 22*  CREATININE  --    <  > 0.93 1.09  CALCIUM  --    < > 8.1* 8.4*  MG 1.9  --   --   --   AST  --    < > 32  --   ALT  --    < > 34  --   ALKPHOS  --    < > 167*  --   BILITOT  --    < > 1.2  --    < > = values in this interval not displayed.   ------------------------------------------------------------------------------------------------------------------  Cardiac Enzymes No results for input(s): TROPONINI in the last 168 hours. ------------------------------------------------------------------------------------------------------------------  RADIOLOGY:  US Carotid Bilateral  Result Date: 10/25/2017 CLINICAL DATA:  Stroke EXAM: BILATERAL CAROTID DUPLEX ULTRASOUND TECHNIQUE: [name] scale imaging, color Doppler and duplex ultrasound were performed of bilateral carotid and vertebral arteries in the neck. COMPARISON:  None. FINDINGS: Criteria: Quantification of carotid stenosis is based on velocity parameters that correlate the residual internal carotid diameter with NASCET-based stenosis levels, using the diameter of the distal internal carotid lumen as the denominator for stenosis measurement. The following velocity measurements were obtained: RIGHT ICA:  54 cm/sec CCA:  84 cm/sec SYSTOLIC ICA/CCA RATIO:  0.6 DIASTOLIC ICA/CCA RATIO: ECA:  76 cm/sec LEFT ICA:  60 cm/sec CCA:  70 cm/sec SYSTOLIC ICA/CCA RATIO:  0.9 DIASTOLIC  ICA/CCA RATIO: ECA:  81 cm/sec RIGHT CAROTID ARTERY: Mild mixed plaque in the bulb. Low resistance internal carotid Doppler pattern is preserved. There is scattered calcified plaque in the lower and mid internal carotid artery. RIGHT VERTEBRAL ARTERY:  Antegrade. LEFT CAROTID ARTERY: Mild focal smooth mixed plaque in the bulb. Low resistance internal carotid Doppler pattern is preserved. There is calcified plaque extending from the bulb into the lower internal carotid artery. LEFT VERTEBRAL ARTERY:  Antegrade. IMPRESSION: Less than 50% stenosis in the right and left internal carotid arteries.  Electronically Signed   By: [name] M.D.   On: 10/25/2017 09:04     ASSESSMENT AND PLAN:    83 year old male with PAF on anticoagulation, diabetes and peripheral vascular disease with left BKA who presented May 25 with hyperglycemia and since admission has had several complications.  1.  Non-STEMI: Patient is poor candidate for invasive work-up as per cardiology Continue medical management with aspirin, statin, metoprolol Troponin max 17.51  2.  Metabolic encephalopathy in the setting of 7 mm subacute right cerebral infarct: Continue aspirin, Xarelto and statin Echocardiogram shows EF 20 to 25% with akinesis of apical myocardium Carotid Doppler showed no hemodynamically significant stenosis  3.  Acute on chronic systolic heart failure EF 20 to 25%: BMP for am Stop IV Lasix today  4.  PAF: Continue digoxin, diltiazem and metoprolol  for heart rate control Continue Xarelto for anticoagulation   5.  Diabetes: Continue sliding scale with NovoLog and Lantus    6.  PAD with ulceration right lower extremity: Plan was for right lower extremity angiogram and possible revascularization however due to CVA he is at very high risk for anesthesia. Plan for Thursday if wife agrees. Dr. Lucky [name] to speak with wife today PT evaluation recs snf   Palliative care consult appreicated.  Management plans discussed with the patient's wife and she is in agreement.  CODE STATUS: DNR  TOTAL TIME TAKING CARE OF THIS PATIENT: 24 minutes.     POSSIBLE D/C ??? DEPENDING ON CLINICAL CONDITION.   [name] M.D on 10/26/2017 at 10:18 AM  Between 7am to 6pm - Pager - [phone] After 6pm go to [email or web address] - password [email or web address] [facility]  Office  [phone]  CC: Primary care physician; Jesse Aly, FNP  Note: This dictation was prepared with Dragon dictation along with smaller phrase technology. Any transcriptional errors that result from this process are  unintentional.

## 2017-10-26 NOTE — Plan of Care (Signed)
  Problem: Safety: Goal: Ability to remain free from injury will improve Outcome: Progressing   Problem: Education: Goal: Knowledge of secondary prevention will improve Outcome: Not Progressing

## 2017-10-26 NOTE — Progress Notes (Signed)
Subjective: Patient again sedated.  Was agitated overnight.  On yesterday after sedation awakened around noon and had a reasonable day per nursing.  Became more confused overnight and required two mg of  Ativan.  Objective: Current vital signs: BP (!) 158/90 (BP Location: Left Arm)   Pulse 91   Temp 97.8 F (36.6 C) (Axillary)   Resp 18   Ht 5\' 7"  (1.702 m)   Wt 88.5 kg (195 lb)   SpO2 94%   BMI 30.54 kg/m  Vital signs in last 24 hours: Temp:  [97.4 F (36.3 C)-97.9 F (36.6 C)] 97.8 F (36.6 C) (06/05 0807) Pulse Rate:  [71-91] 91 (06/05 0807) Resp:  [17-18] 18 (06/05 0807) BP: (141-164)/(85-98) 158/90 (06/05 0807) SpO2:  [93 %-97 %] 94 % (06/05 0807) Weight:  [88.5 kg (195 lb)] 88.5 kg (195 lb) (06/05 0558)  Intake/Output from previous day: 06/04 0701 - 06/05 0700 In: 243 [P.O.:240; I.V.:3] Out: 1600 [Urine:1600] Intake/Output this shift: No intake/output data recorded. Nutritional status:  Diet Order           DIET DYS 3 Room service appropriate? Yes; Fluid consistency: Thin  Diet effective now          Neurologic Exam: Patient unarousable by sterna rub.  Resists eye opening.  No speech.  Does not follow commands.    Lab Results: Basic Metabolic Panel: Recent Labs  Lab 10/20/17 1133  10/21/17 0627 10/23/17 0640 10/24/17 0454 10/26/17 0259  NA  --   --  137 138 139 136  K  --   --  3.8 3.9 3.7 4.0  CL  --   --  102 102 106 102  CO2  --   --  26 26 23 25   GLUCOSE  --   --  145* 215* 94 117*  BUN  --   --  26* 33* 29* 22*  CREATININE  --   --  0.92 1.10 0.93 1.09  CALCIUM  --    < > 8.2* 8.3* 8.1* 8.4*  MG 1.9  --   --   --   --   --    < > = values in this interval not displayed.    Liver Function Tests: Recent Labs  Lab 10/23/17 0640 10/24/17 0454  AST 30 32  ALT 35 34  ALKPHOS 175* 167*  BILITOT 1.1 1.2  PROT 6.1* 6.1*  ALBUMIN 2.8* 2.8*   No results for input(s): LIPASE, AMYLASE in the last 168 hours. Recent Labs  Lab 10/22/17 1719   AMMONIA <9*    CBC: Recent Labs  Lab 10/23/17 0640 10/24/17 0454 10/26/17 0259  WBC 14.2* 10.8* 9.9  HGB 14.7 15.7 16.0  HCT 44.1 46.3 46.6  MCV 82.8 83.0 82.9  PLT 235 241 265    Cardiac Enzymes: No results for input(s): CKTOTAL, CKMB, CKMBINDEX, TROPONINI in the last 168 hours.  Lipid Panel: Recent Labs  Lab 10/25/17 0612  CHOL 122  TRIG 65  HDL 39*  CHOLHDL 3.1  VLDL 13  LDLCALC 70    CBG: Recent Labs  Lab 10/25/17 1149 10/25/17 1652 10/25/17 2119 10/26/17 0741 10/26/17 1131  GLUCAP 127* 100* 226* 109* 38    Microbiology: Results for orders placed or performed during the hospital encounter of 10/14/17  MRSA PCR Screening     Status: None   Collection Time: 10/15/17  3:28 AM  Result Value Ref Range Status   MRSA by PCR NEGATIVE NEGATIVE Final    Comment:  The GeneXpert MRSA Assay (FDA approved for NASAL specimens only), is one component of a comprehensive MRSA colonization surveillance program. It is not intended to diagnose MRSA infection nor to guide or monitor treatment for MRSA infections. Performed at [facility], [address] [place], [place] [address]   Culture, blood (single) w Reflex to ID Panel     Status: None (Preliminary result)   Collection Time: 10/23/17  3:51 PM  Result Value Ref Range Status   Specimen Description BLOOD LAC  Final   Special Requests   Final    BOTTLES DRAWN AEROBIC AND ANAEROBIC Blood Culture adequate volume   Culture   Final    NO GROWTH 3 DAYS Performed at [facility], [address] [place], [place] [address]    Report Status PENDING  Incomplete    Coagulation Studies: No results for input(s): LABPROT, INR in the last 72 hours.  Imaging: US Carotid Bilateral  Result Date: 10/25/2017 CLINICAL DATA:  Stroke EXAM: BILATERAL CAROTID DUPLEX ULTRASOUND TECHNIQUE: Pearline Cables scale imaging, color Doppler and duplex ultrasound were performed of bilateral carotid and vertebral  arteries in the neck. COMPARISON:  None. FINDINGS: Criteria: Quantification of carotid stenosis is based on velocity parameters that correlate the residual internal carotid diameter with NASCET-based stenosis levels, using the diameter of the distal internal carotid lumen as the denominator for stenosis measurement. The following velocity measurements were obtained: RIGHT ICA:  54 cm/sec CCA:  84 cm/sec SYSTOLIC ICA/CCA RATIO:  0.6 DIASTOLIC ICA/CCA RATIO: ECA:  76 cm/sec LEFT ICA:  60 cm/sec CCA:  70 cm/sec SYSTOLIC ICA/CCA RATIO:  0.9 DIASTOLIC ICA/CCA RATIO: ECA:  81 cm/sec RIGHT CAROTID ARTERY: Mild mixed plaque in the bulb. Low resistance internal carotid Doppler pattern is preserved. There is scattered calcified plaque in the lower and mid internal carotid artery. RIGHT VERTEBRAL ARTERY:  Antegrade. LEFT CAROTID ARTERY: Mild focal smooth mixed plaque in the bulb. Low resistance internal carotid Doppler pattern is preserved. There is calcified plaque extending from the bulb into the lower internal carotid artery. LEFT VERTEBRAL ARTERY:  Antegrade. IMPRESSION: Less than 50% stenosis in the right and left internal carotid arteries. Electronically Signed   By: Marybelle Killings M.D.   On: 10/25/2017 09:04    Medications:  I have reviewed the patient's current medications. Scheduled: . aspirin  81 mg Oral Daily  . atorvastatin  80 mg Oral q1800  . collagenase   Topical Daily  . digoxin  0.125 mg Oral Daily  . diltiazem  300 mg Oral Daily  . famotidine  20 mg Oral BID  . haloperidol lactate  1 mg Intramuscular Once  . insulin aspart  0-15 Units Subcutaneous TID WC  . insulin aspart  0-5 Units Subcutaneous QHS  . insulin aspart  4 Units Subcutaneous TID WC  . insulin glargine  15 Units Subcutaneous QHS  . lamoTRIgine  100 mg Oral BID  . mouth rinse  15 mL Mouth Rinse BID  . metoprolol succinate  150 mg Oral Daily  . multivitamin-lutein  1 capsule Oral Daily  . polyethylene glycol  17 g Oral Daily  .  potassium chloride  20 mEq Oral BID  . protein supplement shake  11 oz Oral BID BM  . risperiDONE  0.5 mg Oral QHS  . rivaroxaban  20 mg Oral Q supper  . senna-docusate  1 tablet Oral Daily  . sodium chloride flush  3 mL Intravenous Q12H  . ziprasidone  10 mg Intramuscular Once  Assessment/Plan: Patient again required sedation overnight.  Appears to be sundowning.  Did become more alert towards the middle of the day.  Repeat imaging not indicated at this time.    No further neurologic intervention is recommended at this time.  If further questions arise, please call or page at that time.  Thank you for allowing neurology to participate in the care of this patient.    LOS: 11 days   Alexis Goodell, MD Neurology [phone] 10/26/2017  1:14 PM

## 2017-10-26 NOTE — Progress Notes (Signed)
 Vein and Vascular Surgery  Daily Progress Note   Subjective  - 5 Days Post-Op  Patient very somnolent today.  Apparently had to be sedated for agitation.  Mental status is not really much improved from several days ago.  Objective Vitals:   10/25/17 1207 10/25/17 1650 10/26/17 0558 10/26/17 0807  BP: 136/89 (!) 141/85 (!) 164/98 (!) 158/90  Pulse: 68 71 90 91  Resp: 18 17 18 18   Temp:  97.9 F (36.6 C) (!) 97.4 F (36.3 C) 97.8 F (36.6 C)  TempSrc:  Oral Oral Axillary  SpO2:  97% 93% 94%  Weight:   195 lb (88.5 kg)   Height:        Intake/Output Summary (Last 24 hours) at 10/26/2017 1355 Last data filed at 10/26/2017 0605 Gross per 24 hour  Intake 240 ml  Output 1600 ml  Net -1360 ml    PULM  CTAB CV  irregular and somewhat tachycardic VASC  right foot wound stable.  No major changes.  Laboratory CBC    Component Value Date/Time   WBC 9.9 10/26/2017 0259   HGB 16.0 10/26/2017 0259   HGB 16.3 01/20/2013 0255   HCT 46.6 10/26/2017 0259   HCT 47.9 01/20/2013 0255   PLT 265 10/26/2017 0259   PLT 245 01/20/2013 0255    BMET    Component Value Date/Time   NA 136 10/26/2017 0259   NA 140 01/20/2013 0255   K 4.0 10/26/2017 0259   K 3.5 01/20/2013 0255   CL 102 10/26/2017 0259   CL 103 01/20/2013 0255   CO2 25 10/26/2017 0259   CO2 30 01/20/2013 0255   GLUCOSE 117 (H) 10/26/2017 0259   GLUCOSE 145 (H) 01/20/2013 0255   BUN 22 (H) 10/26/2017 0259   BUN 22 (H) 01/20/2013 0255   CREATININE 1.09 10/26/2017 0259   CREATININE 1.09 01/20/2013 0255   CALCIUM 8.4 (L) 10/26/2017 0259   CALCIUM 9.4 01/20/2013 0255   GFRNONAA >60 10/26/2017 0259   GFRNONAA >60 01/20/2013 0255   GFRAA >60 10/26/2017 0259   GFRAA >60 01/20/2013 0255    Assessment/Planning:    PAD with ulceration right lower extremity.  The patient does not seem to be in a good state for an angiogram.  He would require anesthesia due to his combativeness which would certainly worsen his  mental status.  At this point, I will postpone an angiogram and I will try to see him back in the office in a couple of weeks to potentially discuss this.  This is a difficult and limb threatening situation but with his cardiac arrhythmias, recent stroke, mental status changes, and overall decline in status we will have to hold on an angiogram for now.  No other vascular recommendations at this point.  Have him see me in clinic in 2 to 3 weeks in follow-up.    Leotis Pain  10/26/2017, 1:55 PM

## 2017-10-26 NOTE — Progress Notes (Signed)
Patient's daughter asked about patients code status. Per daughter she talked to her mom (patient's wife) and stated that they both agreed that they did NOT want patient to be DNR. MD Mody paged and notified about family concern. MD Benjie Karvonen will call patient's wife. Per verbal orders with read back change patient's code status to FULL code. Family agree on patient to be FULL code. Will continue to monitor patient. DNR bracelet taken off.

## 2017-10-27 ENCOUNTER — Encounter
Admission: EM | Disposition: A | Discharge status: Skilled Nursing Facility | Payer: Self-pay | Source: Home / Self Care | Attending: Internal Medicine

## 2017-10-27 DIAGNOSIS — D72829 Elevated white blood cell count, unspecified: Secondary | ICD-10-CM

## 2017-10-27 LAB — BASIC METABOLIC PANEL
ANION GAP: 10 (ref 5–15)
BUN: 27 mg/dL — ABNORMAL HIGH (ref 6–20)
CALCIUM: 8.2 mg/dL — AB (ref 8.9–10.3)
CO2: 20 mmol/L — ABNORMAL LOW (ref 22–32)
Chloride: 104 mmol/L (ref 101–111)
Creatinine, Ser: 0.91 mg/dL (ref 0.61–1.24)
Glucose, Bld: 90 mg/dL (ref 65–99)
Potassium: 4.6 mmol/L (ref 3.5–5.1)
Sodium: 134 mmol/L — ABNORMAL LOW (ref 135–145)

## 2017-10-27 LAB — GLUCOSE, CAPILLARY
GLUCOSE-CAPILLARY: 85 mg/dL (ref 65–99)
GLUCOSE-CAPILLARY: 184 mg/dL — AB (ref 65–99)
GLUCOSE-CAPILLARY: 187 mg/dL — AB (ref 65–99)
Glucose-Capillary: 248 mg/dL — ABNORMAL HIGH (ref 65–99)
Glucose-Capillary: 194 mg/dL — ABNORMAL HIGH (ref 65–99)

## 2017-10-27 LAB — CBC
HEMATOCRIT: 49.9 % (ref 40.0–52.0)
Hemoglobin: 16.7 g/dL (ref 13.0–18.0)
MCH: 28 pg (ref 26.0–34.0)
MCHC: 33.5 g/dL (ref 32.0–36.0)
MCV: 83.7 fL (ref 80.0–100.0)
PLATELETS: 266 10*3/uL (ref 150–440)
RBC: 5.97 MIL/uL — ABNORMAL HIGH (ref 4.40–5.90)
RDW: 18.4 % — AB (ref 11.5–14.5)
WBC: 12.6 10*3/uL — AB (ref 3.8–10.6)

## 2017-10-27 SURGERY — LOWER EXTREMITY ANGIOGRAPHY
Anesthesia: Moderate Sedation | Laterality: Right

## 2017-10-27 MED ORDER — INSULIN ASPART 100 UNIT/ML ~~LOC~~ SOLN
0.0000 [IU] | Freq: Three times a day (TID) | SUBCUTANEOUS | Status: DC
  Administered 2017-10-27 (×2): 2 [IU] via SUBCUTANEOUS
  Administered 2017-10-28: 1 [IU] via SUBCUTANEOUS
  Filled 2017-10-27 (×3): qty 1

## 2017-10-27 MED ORDER — INSULIN GLARGINE 100 UNIT/ML ~~LOC~~ SOLN
10.0000 [IU] | Freq: Every day | SUBCUTANEOUS | Status: DC
  Administered 2017-10-27: 10 [IU] via SUBCUTANEOUS
  Filled 2017-10-27 (×2): qty 0.1

## 2017-10-27 NOTE — Progress Notes (Signed)
Daily Progress Note   Patient Name: [name]       Date: 10/27/2017 DOB: 05/16/1935  Age: 83 y.o. MRN#: [id number] Attending Physician: Bettey Costa, MD Primary Care Physician: Alanson Aly, FNP Admit Date: 10/14/2017  Reason for Consultation/Follow-up: Establishing goals of care  Subjective: Patient is more awake and calm today. He is able to identify his name, dob, and that he is at [facility]. However on other assessments he appears to have some confusion. Began having a random conversation not related to his care. No family at the bedside. Spoke with wife who states he is now a full code and her and their children feels as though he is slowly "coming around". Attempted to discuss his current status and also vascular status of right leg. She verbalized understanding and reports family is aware of all that was said. She is hopeful he can be sent to a facility soon and they can see how he "really does".   Chart Reviewed.   Length of Stay: 12  Current Medications: Scheduled Meds:  . aspirin  81 mg Oral Daily  . atorvastatin  80 mg Oral q1800  . collagenase   Topical Daily  . digoxin  0.125 mg Oral Daily  . diltiazem  300 mg Oral Daily  . famotidine  20 mg Oral BID  . haloperidol lactate  1 mg Intramuscular Once  . insulin aspart  0-15 Units Subcutaneous TID WC  . insulin aspart  0-5 Units Subcutaneous QHS  . insulin aspart  4 Units Subcutaneous TID WC  . insulin glargine  15 Units Subcutaneous QHS  . lamoTRIgine  100 mg Oral BID  . mouth rinse  15 mL Mouth Rinse BID  . metoprolol succinate  150 mg Oral Daily  . multivitamin-lutein  1 capsule Oral Daily  . polyethylene glycol  17 g Oral Daily  . potassium chloride  20 mEq Oral BID  . protein supplement shake  11 oz  Oral BID BM  . risperiDONE  0.5 mg Oral QHS  . rivaroxaban  20 mg Oral Q supper  . senna-docusate  1 tablet Oral Daily  . sodium chloride flush  3 mL Intravenous Q12H  . ziprasidone  10 mg Intramuscular Once    Continuous Infusions:   PRN Meds: acetaminophen, bisacodyl, dextrose, haloperidol lactate, LORazepam, metoprolol  tartrate, ondansetron (ZOFRAN) IV, senna-docusate  Physical Exam  Constitutional: He appears well-developed. He is cooperative.  Cardiovascular: Regular rhythm and normal heart sounds. Tachycardia present. Exam reveals decreased pulses.  2 pedal   Pulmonary/Chest: Effort normal. He has decreased breath sounds.  Abdominal: Soft. Normal appearance.  Musculoskeletal:  Generalized weakness, left leg amputation w/prosthesis   Neurological: He is alert.  Skin: Skin is warm and dry.  Right leg ulceration (bandaged), LBKA  Psychiatric: Cognition and memory are impaired. He expresses inappropriate judgment.  Confusion   Nursing note and vitals reviewed.           Vital Signs: BP 125/80 (BP Location: Left Arm)   Pulse 79   Temp 97.7 F (36.5 C)   Resp 18   Ht 5\' 7"  (1.702 m)   Wt 88.9 kg (196 lb)   SpO2 94%   BMI 30.70 kg/m  SpO2: SpO2: 94 % O2 Device: O2 Device: Room Air O2 Flow Rate: O2 Flow Rate (L/min): 7.5 L/min  Intake/output summary:   Intake/Output Summary (Last 24 hours) at 10/27/2017 1126 Last data filed at 10/27/2017 1057 Gross per 24 hour  Intake 123 ml  Output 650 ml  Net -527 ml   LBM: Last BM Date: 10/24/17 Baseline Weight: Weight: 81.6 kg (180 lb) Most recent weight: Weight: 88.9 kg (196 lb)       Palliative Assessment/Data:PPS 30%   Flowsheet Rows     Most Recent Value  Intake Tab  Referral Department  Hospitalist  Unit at Time of Referral  Med/Surg Unit  Palliative Care Primary Diagnosis  Cardiac  Date Notified  10/19/17  Palliative Care Type  New Palliative care  Reason for referral  Clarify Goals of Care  Date of Admission   10/14/17  Date first seen by Palliative Care  10/19/17  # of days Palliative referral response time  0 Day(s)  # of days IP prior to Palliative referral  5  Clinical Assessment  Psychosocial & Spiritual Assessment  Palliative Care Outcomes      Patient Active Problem List   Diagnosis Date Noted  . Atrial fibrillation with rapid ventricular response ([place]) 10/15/2017  . BKA stump complication ([place]) 98/03/9146  . 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 S/P BKA (below knee amputation) unilateral, left ([place]) 02/01/2017  . Chronic diastolic heart failure ([place]) 01/21/2017  . Pressure injury of skin 01/18/2017  . 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 Diabetic foot ulcer ([place]) 12/29/2016  . Ataxia 10/21/2016  . History of femoral angiogram 09/06/2016  . Left leg pain 09/06/2016  . Leukocytosis 09/06/2016  . Generalized weakness 09/06/2016  . A-fib ([place]) 08/30/2016  . Pneumonia 05/19/2016  . Hyperlipidemia 04/20/2016  . Back pain 04/19/2016  . Type 2 diabetes mellitus treated with insulin ([place]) 04/19/2016  . Essential hypertension 04/19/2016  . PAD (peripheral artery disease) ([place]) 04/19/2016  . Erectile dysfunction following radical prostatectomy 06/25/2015  . History of prostate cancer 12/23/2014  . Incontinence 12/23/2014    Palliative Care Assessment & Plan   Patient Profile: [name] a45 y.o.malewith a known history of AF (Xarelto), DM, PVD, T2IDDM, AF + RVR. Pt w/ L BKA, RLE wound.  Recommendations/Plan: FULL CODE at family's request Continue to treat the treatable at family's request. Family verbalized plans and agreement for SNF/rehab placement in addition to outpatient palliative services.   Palliative Medicine team will continue to support patient, family, and medical team during hospitalization as needed.  Goals of Care and Additional Recommendations: Limitations on Scope of Treatment:  Full Scope Treatment-continue to treat the treatable.   Code Status:    Code Status Orders  (From admission, onward)        Start     Ordered   10/24/17 1152  Do not attempt resuscitation (DNR)  Continuous    Question Answer Comment  In the event of cardiac or respiratory ARREST Do not call a "code blue"   In the event of cardiac or respiratory ARREST Do not perform Intubation, CPR, defibrillation or ACLS   In the event of cardiac or respiratory ARREST Use medication by any route, position, wound care, and other measures to relive pain and suffering. May use oxygen, suction and manual treatment of airway obstruction as needed for comfort.      10/24/17 1152    Code Status History    Date Active Date Inactive Code Status Order ID Comments User Context   10/15/2017 0309 10/24/2017 1152 Full Code [id number]  Arta Silence, MD Inpatient   02/10/2017 1904 02/12/2017 1908 Full Code [id number]  Katha Cabal, MD ED   02/10/2017 1638 02/10/2017 1904 Full Code [id number]  Sela Hua, PA-C Outpatient   01/08/2017 1342 01/19/2017 2103 Full Code [id number]  Loletha Grayer, MD ED   12/29/2016 2232 12/30/2016 1634 Full Code [id number]  Lance Coon, MD ED   12/20/2016 1251 12/20/2016 1842 Full Code [id number]  Algernon Huxley, MD Inpatient   10/22/2016 0040 10/24/2016 2058 Full Code [id number]  Lance Coon, MD Inpatient   10/14/2016 1201 10/14/2016 1925 Full Code [id number]  Algernon Huxley, MD Inpatient   08/30/2016 1338 09/06/2016 1941 Full Code [id number]  Epifanio Lesches, MD Inpatient   05/19/2016 0454 05/22/2016 1907 Full Code [id number]  Saundra Shelling, MD Inpatient      Prognosis:  Unable to determine-guarded to poor in the setting of confusion, decreased mobility, PVD, hypertension, tachycardia, diabetes, chronic kidney disease, a-fibb, Non-Stemi (not a candidate for invasive work-up) Troponin max 38.88, 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   Discharge Planning: [place] for rehab with Palliative care service follow-up  Care plan was discussed with patient's wife, Randall Hiss, [place], and Physiological scientist.   Thank you for allowing the Palliative Medicine Team to assist in the care of this patient.   Total Time 35 min.  Prolonged Time Billed  NO      Greater than 50%  of this time was spent counseling and coordinating care related to the above assessment and plan.  Alda Lea, NP-BC Palliative Medicine Team  Phone: [phone] Fax: [phone]  Please contact Palliative Medicine Team phone at [phone] for questions and concerns.

## 2017-10-27 NOTE — Progress Notes (Signed)
Refused cpap.

## 2017-10-27 NOTE — Evaluation (Signed)
Occupational Therapy Evaluation Patient Details Name: [name] MRN: [id number] DOB: [birth year] Today's Date: 10/27/2017    History of Present Illness 83yo male pt presented to ER secondary to tachycardia, hyperglycemia; admitted for management of afib with RVR.  PAtient noted with ulceration to R distal LE; attempted angiogram during hospitalization, cancelled due to patient agitation.   Clinical Impression   Pt seen for OT evaluation this date. Prior to hospital admission, pt was using an AD for mobility and with spouse providing assist for ADL/IADL. No family present during session, pt is a poor historian 2:2 confusion. Able to correctly respond to simple questions and follow simple commands approx 60% of opportunities. Pt lives with her spouse.  Currently pt demonstrates impairments in cognitive sequencing/initiation/safety awareness/awareness of deficits, impaired strength, and decreased safety/awareness. Pt required set up and PRN min assist for self feeding tasks with max verbal and visual cues to follow simple instructions. Improved independence with automatic tasks, more difficulty with unfamiliar and confrontational tasks.  Pt would benefit from skilled OT to address noted impairments and functional limitations (see below for any additional details) in order to maximize safety and independence while minimizing falls risk and caregiver burden.  Upon hospital discharge, recommend pt discharge to [place].    Follow Up Recommendations  SNF    Equipment Recommendations  3 in 1 bedside commode    Recommendations for Other Services       Precautions / Restrictions Precautions Precautions: Fall Restrictions Weight Bearing Restrictions: No Other Position/Activity Restrictions: LLE BKA, has prosthesis      Mobility Bed Mobility Overal bed mobility: Needs Assistance           General bed mobility comments: deferred, up in recliner     Balance Overall balance assessment:  Needs assistance Sitting-balance support: No upper extremity supported;Feet supported Sitting balance-Leahy Scale: Fair                              ADL either performed or assessed with clinical judgement   ADL Overall ADL's : Needs assistance/impaired Eating/Feeding: Sitting;Set up;Cueing for sequencing;Minimal assistance;Moderate assistance Eating/Feeding Details (indicate cue type and reason): pt demonstrated difficulty with initiation, simple commands (open straw, hold cup). With initial hand over hand assist, pt able to continue movement pattern to self feed. Unable to follow instruction to spread butter on his bread or open the sugar packet for his tea. Grooming: Sitting;Set up;Minimal assistance;Cueing for sequencing Grooming Details (indicate cue type and reason): initial min assist and visual cues with pt able to carry over briefly Upper Body Bathing: Sitting;Minimal assistance   Lower Body Bathing: Sitting/lateral leans;Moderate assistance;Maximal assistance;Cueing for sequencing   Upper Body Dressing : Sitting;Minimal assistance;Cueing for sequencing   Lower Body Dressing: Sitting/lateral leans;Moderate assistance;Maximal assistance;Cueing for sequencing Lower Body Dressing Details (indicate cue type and reason): pt required max simple verbal, visual, and tactile cues to initiate and follow simple commands ultimately requiring mod-max assist for LB dressing from OT   Toilet Transfer Details (indicate cue type and reason): unsafe to attempt                 Vision Baseline Vision/History: No visual deficits Patient Visual Report: Other (comment)(pt did not verbalize) Vision Assessment?: No apparent visual deficits     Perception     Praxis      Pertinent Vitals/Pain Pain Assessment: Faces Faces Pain Scale: No hurt Pain Location:  R LE Pain Descriptors / Indicators: Aching;Grimacing;Guarding  Pain Intervention(s): Monitored during session     Hand  Dominance Right   Extremity/Trunk Assessment Upper Extremity Assessment Upper Extremity Assessment: Overall WFL for tasks assessed   Lower Extremity Assessment Lower Extremity Assessment: Generalized weakness(grossly at least 4-/5 throughout; no focal weakness, sensory or coordination deficit noted. LLE BKA w/ prosthesis)   Cervical / Trunk Assessment Cervical / Trunk Assessment: Normal   Communication Communication Communication: No difficulties   Cognition Arousal/Alertness: Awake/alert Behavior During Therapy: Flat affect Overall Cognitive Status: Impaired/Different from baseline                                 General Comments: inconsistent command following; poor insight/safety awareness; increased time and cues to initiate tasks; oriented to self and place but not to situation or date/time   General Comments       Exercises    Shoulder Instructions      Home Living Family/patient expects to be discharged to:: Private residence Living Arrangements: Spouse/significant other Available Help at Discharge: Family;Available 24 hours/day Type of Home: House Home Access: Ramped entrance     Home Layout: One level     Bathroom Shower/Tub: Occupational psychologist: Standard     Home Equipment: Environmental consultant - 2 wheels;Cane - single point          Prior Functioning/Environment Level of Independence: Independent with assistive device(s)        Comments: Mod indep with SPC for ADLs, household and community mobilization; denies fall history.        OT Problem List: Decreased strength;Decreased knowledge of use of DME or AE;Decreased activity tolerance;Decreased cognition;Decreased safety awareness      OT Treatment/Interventions: Self-care/ADL training;Balance training;Therapeutic exercise;Therapeutic activities;Neuromuscular education;Energy conservation;Cognitive remediation/compensation;DME and/or AE instruction;Patient/family education    OT  Goals(Current goals can be found in the care plan section) Acute Rehab OT Goals Patient Stated Goal: to get better OT Goal Formulation: With patient Time For Goal Achievement: 11/10/17 Potential to Achieve Goals: Good ADL Goals Pt Will Perform Lower Body Dressing: with min assist;sit to/from stand(min verbal cues for safety/sequencing) Pt Will Transfer to Toilet: bedside commode;ambulating;with mod assist Additional ADL Goal #1: Pt will utilize utensils and open packets/containers with minimal PRN assist for set up and verbal cues for initiating and sequencing self feeding of meal.  OT Frequency: Min 2X/week   Barriers to D/C:            Co-evaluation              AM-PAC PT "6 Clicks" Daily Activity     Outcome Measure Help from another person eating meals?: A Little Help from another person taking care of personal grooming?: A Little Help from another person toileting, which includes using toliet, bedpan, or urinal?: A Lot Help from another person bathing (including washing, rinsing, drying)?: A Lot Help from another person to put on and taking off regular upper body clothing?: A Little Help from another person to put on and taking off regular lower body clothing?: A Lot 6 Click Score: 15   End of Session    Activity Tolerance: Patient tolerated treatment well Patient left: in chair;with call bell/phone within reach;with chair alarm set;with nursing/sitter in room  OT Visit Diagnosis: Other abnormalities of gait and mobility (R26.89);Muscle weakness (generalized) (M62.81);Other symptoms and signs involving cognitive function                Time: 0623-7628 OT  Time Calculation (min): 26 min Charges:  OT General Charges $OT Visit: 1 Visit OT Evaluation $OT Eval Moderate Complexity: 1 Mod  Jeni Salles, MPH, MS, OTR/L ascom [phone] 10/27/17, 2:48 PM

## 2017-10-27 NOTE — Progress Notes (Signed)
Physical Therapy Treatment Patient Details Name: [name] MRN: [id number] DOB: 07-Oct-1934 Today's Date: 10/27/2017    History of Present Illness presented to ER secondary to tachycardia, hyperglycemia; admitted for management of afib with RVR.  PAtient noted with ulceration to R distal LE; attempted angiogram during hospitalization, cancelled due to patient agitation.    PT Comments    Presents with increased pain to R LE this date, tolerating less activity and WBing that during previous sessions.  Demonstrates improved sitting balance and midline orientation edge of bed, but cognition continues to fluctuate, limiting overall participation and progress with targeted interventions. Continue to recommend transition to STR for continued therapy and 24 hour physical assist (+2 at times for optimal safety).    Follow Up Recommendations  SNF     Equipment Recommendations       Recommendations for Other Services       Precautions / Restrictions Precautions Precautions: Fall Restrictions Weight Bearing Restrictions: No    Mobility  Bed Mobility Overal bed mobility: Needs Assistance Bed Mobility: Supine to Sit     Supine to sit: Mod assist;Max assist     General bed mobility comments: decreased ability to indep problem-solve and initiate movement transition this date; hand-over-hand required to guide movement of all extremities and trunk  Transfers Overall transfer level: Needs assistance Equipment used: Rolling walker (2 wheeled) Transfers: Sit to/from [facility] Sit to Stand: Mod assist;+2 physical assistance         General transfer comment: increased time/effort required; hand-over-hand assist for UE placement on RW.  Constant hands-on assist for weight shift to unweight/advance LEs.  Ambulation/Gait             General Gait Details: unsafe/unable this date   Stairs             Wheelchair Mobility    Modified Rankin (Stroke  Patients Only)       Balance Overall balance assessment: Needs assistance Sitting-balance support: No upper extremity supported;Feet supported Sitting balance-Leahy Scale: Fair Sitting balance - Comments: improved midline awareness and self-correction to neutral   Standing balance support: Bilateral upper extremity supported Standing balance-Leahy Scale: Poor                              Cognition Arousal/Alertness: Awake/alert Behavior During Therapy: Flat affect Overall Cognitive Status: Impaired/Different from baseline                                 General Comments: inconsistent command following; poor insight/safety awareness      Exercises Other Exercises Other Exercises: Unsupported high sitting edge of bed, cga/close sup for balance-attempted participation with dynamic reaching task.  Level of assist and performance fluctuates with cognition; significant inconsistency even between repetitions.  Does demonstrate improved visual scanning, midline orientation and gross reaching compared to previous sessions; some degree of difficulty, coordination deficit (?) R UE with fine motor/grasp Other Exercises: Sit/stand x2 with RW, min/mod assist +2-assist for foot placement and lift off; manual cuing for anterior weight translation and static balance.  Progressed to include alternate LE stepping and transfer to recliner, mod assist +2.  Constant facilitation for ant/lateral weight shift to unweight/advance LEs for stepping/gait.    General Comments        Pertinent Vitals/Pain Pain Assessment: Faces Faces Pain Scale: Hurts even more Pain Location:  R LE Pain  Descriptors / Indicators: Aching;Grimacing;Guarding Pain Intervention(s): Limited activity within patient's tolerance;Monitored during session;Repositioned    Home Living                      Prior Function            PT Goals (current goals can now be found in the care plan section)  Acute Rehab PT Goals Patient Stated Goal: to get out of the bed PT Goal Formulation: With patient/family Time For Goal Achievement: 11/07/17 Potential to Achieve Goals: Fair Progress towards PT goals: Not progressing toward goals - comment(limited by fluctuating cognitive status)    Frequency    Min 2X/week      PT Plan Current plan remains appropriate    Co-evaluation              AM-PAC PT "6 Clicks" Daily Activity  Outcome Measure  Difficulty turning over in bed (including adjusting bedclothes, sheets and blankets)?: Unable Difficulty moving from lying on back to sitting on the side of the bed? : Unable Difficulty sitting down on and standing up from a chair with arms (e.g., wheelchair, bedside commode, etc,.)?: Unable Help needed moving to and from a bed to chair (including a wheelchair)?: Total Help needed walking in hospital room?: Total Help needed climbing 3-5 steps with a railing? : Total 6 Click Score: 6    End of Session Equipment Utilized During Treatment: Gait belt Activity Tolerance: Patient limited by pain(limited by cognition) Patient left: in chair;with chair alarm set;with call bell/phone within reach Nurse Communication: Mobility status PT Visit Diagnosis: Unsteadiness on feet (R26.81);Muscle weakness (generalized) (M62.81);Difficulty in walking, not elsewhere classified (R26.2)     Time: 1324-4010 PT Time Calculation (min) (ACUTE ONLY): 47 min  Charges:  $Therapeutic Activity: 38-52 mins                    G Codes:       Miguelina Fore H. Owens Shark, PT, DPT, NCS 10/27/17, 1:21 PM [phone]

## 2017-10-27 NOTE — Plan of Care (Signed)
  Problem: Pain Managment: Goal: General experience of comfort will improve Outcome: Progressing   Problem: Safety: Goal: Ability to remain free from injury will improve Outcome: Progressing   Problem: Education: Goal: Knowledge of secondary prevention will improve Outcome: Not Progressing

## 2017-10-27 NOTE — Progress Notes (Signed)
[facility] [place] NAME: [name]    MR#:  [id number]  DATE OF BIRTH:  January 03, 1935  SUBJECTIVE:   No acute events reported overnight.   REVIEW OF SYSTEMS:    Able to obtain  Tolerating Diet: yes      DRUG ALLERGIES:   Allergies  Allergen Reactions  . Contrast Media [Iodinated Diagnostic Agents] Shortness Of Breath    SOB  . Iohexol Shortness Of Breath     Desc: Respiratory Distress, Laryngedema, diaphoresis, Onset Date: [id number]   . Metrizamide Shortness Of Breath    SOB SOB SOB   . Latex Rash    VITALS:  Blood pressure 125/80, pulse 79, temperature 97.7 F (36.5 C), resp. rate 18, height 5\' 7"  (1.702 m), weight 88.9 kg (196 lb), SpO2 94 %.  PHYSICAL EXAMINATION:  Constitutional: Not in acute distress  hENT: Normocephalic. Eyes: Conjunctivae  are normal. no scleral icterus.  Neck: . Neck supple. No JVD. No tracheal deviation. CVS: RRR, S1/S2 +, no murmurs, no gallops, no carotid bruit.  Pulmonary: Effort and breath sounds normal, no stridor, rhonchi, wheezes, rales.  Abdominal: Soft. BS +,  no distension, tenderness, rebound or guarding.  Musculoskeletal: . No edema and no tenderness.  Neuro: Sleeping left BKA  skin: Right leg with bandage  psychiatric: Confused   LABORATORY PANEL:   CBC Recent Labs  Lab 10/27/17 0745  WBC 12.6*  HGB 16.7  HCT 49.9  PLT 266   ------------------------------------------------------------------------------------------------------------------  Chemistries  Recent Labs  Lab 10/24/17 0454  10/27/17 0745  NA 139   < > 134*  K 3.7   < > 4.6  CL 106   < > 104  CO2 23   < > 20*  GLUCOSE 94   < > 90  BUN 29*   < > 27*  CREATININE 0.93   < > 0.91  CALCIUM 8.1*   < > 8.2*  AST 32  --   --   ALT 34  --   --   ALKPHOS 167*  --   --   BILITOT 1.2  --   --    < > = values in this interval not displayed.    ------------------------------------------------------------------------------------------------------------------  Cardiac Enzymes No results for input(s): TROPONINI in the last 168 hours. ------------------------------------------------------------------------------------------------------------------  RADIOLOGY:  No results found.   ASSESSMENT AND PLAN:    83 year old male with PAF on anticoagulation, diabetes and peripheral vascular disease with left BKA who presented May 25 with hyperglycemia and since admission has had several complications.  1.  Non-STEMI: Patient is poor candidate for invasive work-up as per cardiology Continue medical management with aspirin, statin, metoprolol Troponin max 17.51  2.  Metabolic encephalopathy in the setting of 7 mm subacute right cerebral infarct: Continue aspirin, Xarelto and statin Echocardiogram shows EF 20 to 25% with akinesis of apical myocardium Carotid Doppler showed no hemodynamically significant stenosis  3.  Acute on chronic systolic heart failure EF 20 to 25%: Patient euvolemic   4.  PAF: Continue digoxin, diltiazem and metoprolol  for heart rate control Continue Xarelto for anticoagulation   5.  Diabetes: Continue sliding scale with NovoLog and Lantus    6.  PAD with ulceration right lower extremity: Plan was for right lower extremity angiogram and possible revascularization however due to CVA he is at very high risk for anesthesia. I spoke with patient's wife yesterday and she does not want any surgery due to patient's very high  risk of surgical complications.   Patient will follow-up with vascular surgery in 3 weeks   Palliative care consult appreicated. Telemetry sitter DC'd Management plans discussed with the patient's wife and she is in agreement.  CODE STATUS: DNR  TOTAL TIME TAKING CARE OF THIS PATIENT: 22 minutes.     POSSIBLE D/C ??? DEPENDING ON CLINICAL CONDITION.   Bijal Siglin M.D on 10/27/2017  at 11:56 AM  Between 7am to 6pm - Pager - [phone] After 6pm go to [email or web address] - password [email or web address] [facility]  Office  [phone]  CC: Primary care physician; Alanson Aly, FNP  Note: This dictation was prepared with Dragon dictation along with smaller phrase technology. Any transcriptional errors that result from this process are unintentional.

## 2017-10-27 NOTE — Progress Notes (Signed)
Inpatient Diabetes Program Recommendations  AACE/ADA: New Consensus Statement on Inpatient Glycemic Control (2015)  Target Ranges:  Prepandial:   less than 140 mg/dL      Peak postprandial:   less than 180 mg/dL (1-2 hours)      Critically ill patients:  140 - 180 mg/dL   Results for ROXIE, GUEYE (MRN [id number]) as of 10/27/2017 09:26  Ref. Range 10/26/2017 07:41 10/26/2017 11:31 10/26/2017 17:26 10/26/2017 21:00  Glucose-Capillary Latest Ref Range: 65 - 99 mg/dL 109 (H) 87 94 99   Results for ELMO, RIO (MRN [id number]) as of 10/27/2017 09:26  Ref. Range 10/27/2017 07:52  Glucose-Capillary Latest Ref Range: 65 - 99 mg/dL 85    Home DM Meds: Lantus 10 units QHS       Novolog 0-5 units TID per SSI       Jardiance 10 mg daily       Metformin 1000 mg BID   Current Orders: Lantus 15 units QHS       Novolog Moderate Correction Scale/ SSI (0-15 units) TID AC + HS      Novolog 4 units TID with meals       MD- CBG this AM only 85 mg/dl.    Lantus dose HELD by RN last PM.  No Novolog given yesterday either.  Please consider the following:  1. Reduce Lantus to 10 units QHS (home dose)  2. Reduce Novolog SSI to Sensitive scale (0-9 units) TID AC + HS  3. Stop Novolog 4 units TID with meals (meal coverage) for now     --Will follow patient during hospitalization--  Wyn Quaker RN, MSN, CDE Diabetes Coordinator Inpatient Glycemic Control Team Team Pager: [phone] (8a-5p)

## 2017-10-27 NOTE — Progress Notes (Signed)
Physical Therapy Treatment Patient Details Name: [name] MRN: [id number] DOB: 1934/10/05 Today's Date: 10/27/2017    History of Present Illness 83yo male pt presented to ER secondary to tachycardia, hyperglycemia; admitted for management of afib with RVR.  PAtient noted with ulceration to R distal LE; attempted angiogram during hospitalization, cancelled due to patient agitation.    PT Comments    Lateral scoot pivot transfer, max assist +2, for return to bed.  Significant difficulty comprehending, participating with new movement position (utilized this session as L LE prosthesis not donned).  Recommend continued efforts with SPT to better simulate 'normal', automatic movement for patient as appropriate.   Follow Up Recommendations  SNF     Equipment Recommendations       Recommendations for Other Services       Precautions / Restrictions Precautions Precautions: Fall Restrictions Weight Bearing Restrictions: No Other Position/Activity Restrictions: LLE BKA, has prosthesis    Mobility  Bed Mobility Overal bed mobility: Needs Assistance Bed Mobility: Sit to Supine     Supine to sit: Min assist;Mod assist     General bed mobility comments: deferred, up in recliner  Transfers Overall transfer level: Needs assistance   Transfers: Lateral/Scoot Transfers Sit to Stand: Max assist;+2 physical assistance         General transfer comment: patient with marked difficulty understanding and participating with new movement pattern; limited ability to actively assist with lateral/scoot pivot transfer  Ambulation/Gait                 Stairs             Wheelchair Mobility    Modified Rankin (Stroke Patients Only)       Balance Overall balance assessment: Needs assistance Sitting-balance support: No upper extremity supported;Feet supported Sitting balance-Leahy Scale: Fair Sitting balance - Comments: improved midline awareness and  self-correction to neutral   Standing balance support: Bilateral upper extremity supported Standing balance-Leahy Scale: Poor                              Cognition Arousal/Alertness: Awake/alert Behavior During Therapy: Flat affect;Restless(mild agitation, but redirectable) Overall Cognitive Status: Impaired/Different from baseline                                 General Comments: inconsistent command following; poor insight/safety awareness; increased time and cues to initiate tasks; oriented to self and place but not to situation or date/time      Exercises Other Exercises Other Exercises: Unsupported high sitting edge of bed, cga/close sup for balance-attempted participation with dynamic reaching task.  Level of assist and performance fluctuates with cognition; significant inconsistency even between repetitions.  Does demonstrate improved visual scanning, midline orientation and gross reaching compared to previous sessions; some degree of difficulty, coordination deficit (?) R UE with fine motor/grasp Other Exercises: Sit/stand x2 with RW, min/mod assist +2-assist for foot placement and lift off; manual cuing for anterior weight translation and static balance.  Progressed to include alternate LE stepping and transfer to recliner, mod assist +2.  Constant facilitation for ant/lateral weight shift to unweight/advance LEs for stepping/gait.    General Comments        Pertinent Vitals/Pain Pain Assessment: Faces Faces Pain Scale: Hurts little more Pain Location:  R LE Pain Descriptors / Indicators: Aching;Grimacing;Guarding Pain Intervention(s): Limited activity within patient's tolerance;Monitored during session;Repositioned  Home Living Family/patient expects to be discharged to:: Private residence Living Arrangements: Spouse/significant other Available Help at Discharge: Family;Available 24 hours/day Type of Home: House Home Access: Ramped entrance    Home Layout: One level Home Equipment: Walker - 2 wheels;Cane - single point      Prior Function Level of Independence: Independent with assistive device(s)      Comments: Mod indep with SPC for ADLs, household and community mobilization; denies fall history.   PT Goals (current goals can now be found in the care plan section) Acute Rehab PT Goals Patient Stated Goal: to get better PT Goal Formulation: With patient/family Time For Goal Achievement: 11/07/17 Potential to Achieve Goals: Fair Progress towards PT goals: Not progressing toward goals - comment(limited carry-over between sessions)    Frequency    Min 2X/week      PT Plan Current plan remains appropriate    Co-evaluation              AM-PAC PT "6 Clicks" Daily Activity  Outcome Measure  Difficulty turning over in bed (including adjusting bedclothes, sheets and blankets)?: Unable Difficulty moving from lying on back to sitting on the side of the bed? : Unable Difficulty sitting down on and standing up from a chair with arms (e.g., wheelchair, bedside commode, etc,.)?: Unable Help needed moving to and from a bed to chair (including a wheelchair)?: Total Help needed walking in hospital room?: Total Help needed climbing 3-5 steps with a railing? : Total 6 Click Score: 6    End of Session Equipment Utilized During Treatment: Gait belt Activity Tolerance: Patient limited by fatigue;Treatment limited secondary to agitation Patient left: in bed;with call bell/phone within reach;with bed alarm set Nurse Communication: Mobility status PT Visit Diagnosis: Unsteadiness on feet (R26.81);Muscle weakness (generalized) (M62.81);Difficulty in walking, not elsewhere classified (R26.2)     Time: 6301-6010 PT Time Calculation (min) (ACUTE ONLY): 12 min  Charges:  $Therapeutic Activity: 8-22 mins                    G Codes:       Zed Wanninger H. Owens Shark, PT, DPT, NCS 10/27/17, 4:30 PM [phone]

## 2017-10-27 NOTE — Clinical Social Work Note (Addendum)
CSW was informed that patient has been approved for short term rehab at [facility].  Patient can discharge once he is medically ready for discharge and orders have been rececived, as long as patient has been without a sitter or telesitter for 24 hours.  CSW to continue to follow patient's progress throughout discharge planning.  [name]. [place], MSW, [place]  10-26-17 9:40 AM

## 2017-10-27 NOTE — Plan of Care (Signed)
  Problem: Safety: Goal: Ability to remain free from injury will improve Outcome: Not Progressing  Telesitter

## 2017-10-28 LAB — GLUCOSE, CAPILLARY
Glucose-Capillary: 147 mg/dL — ABNORMAL HIGH (ref 65–99)
Glucose-Capillary: 235 mg/dL — ABNORMAL HIGH (ref 65–99)

## 2017-10-28 LAB — CULTURE, BLOOD (SINGLE)
CULTURE: NO GROWTH
Special Requests: ADEQUATE

## 2017-10-28 MED ORDER — INSULIN ASPART 100 UNIT/ML ~~LOC~~ SOLN: 0.0000 [IU] | Freq: Every day | SUBCUTANEOUS | 11 refills | Status: DC

## 2017-10-28 MED ORDER — INSULIN GLARGINE 100 UNIT/ML ~~LOC~~ SOLN: 10.0000 [IU] | Freq: Every day | SUBCUTANEOUS | 11 refills | Status: DC

## 2017-10-28 MED ORDER — PREMIER PROTEIN SHAKE: 11.0000 [oz_av] | Freq: Two times a day (BID) | ORAL | 0 refills | Status: DC

## 2017-10-28 MED ORDER — RIVAROXABAN 20 MG PO TABS: 20.0000 mg | ORAL_TABLET | Freq: Every day | ORAL | 0 refills | Status: DC

## 2017-10-28 MED ORDER — COLLAGENASE 250 UNIT/GM EX OINT: TOPICAL_OINTMENT | Freq: Every day | CUTANEOUS | 0 refills | Status: DC

## 2017-10-28 MED ORDER — METOPROLOL SUCCINATE ER 50 MG PO TB24: 150.0000 mg | ORAL_TABLET | Freq: Every day | ORAL | Status: DC

## 2017-10-28 MED ORDER — RISPERIDONE 0.5 MG PO TABS: 0.5000 mg | ORAL_TABLET | Freq: Every day | ORAL | 0 refills | Status: DC

## 2017-10-28 NOTE — Discharge Instructions (Signed)
Sinus Tachycardia °Sinus tachycardia is a kind of fast heartbeat. In sinus tachycardia, the heart beats more than 100 times a minute. Sinus tachycardia starts in a part of the heart called the sinus node. Sinus tachycardia may be harmless, or it may be a sign of a serious condition. °What are the causes? °This condition may be caused by: °· Exercise or exertion. °· A fever. °· Pain. °· Loss of body fluids (dehydration). °· Severe bleeding (hemorrhage). °· Anxiety and stress. °· Certain substances, including: °? Alcohol. °? Caffeine. °? Tobacco and nicotine products. °? Diet pills. °? Illegal drugs. °· Medical conditions including: °? Heart disease. °? An infection. °? An overactive thyroid (hyperthyroidism). °? A lack of red blood cells (anemia). ° °What are the signs or symptoms? °Symptoms of this condition include: °· A feeling that the heart is beating quickly (palpitations). °· Suddenly noticing your heartbeat (cardiac awareness). °· Dizziness. °· Tiredness (fatigue). °· Shortness of breath. °· Chest pain. °· Nausea. °· Fainting. ° °How is this diagnosed? °This condition is diagnosed with: °· A physical exam. °· Other tests, such as: °? Blood tests. °? An electrocardiogram (ECG). This test measures the electrical activity of the heart. °? Holter monitoring. For this test, you wear a device that records your heartbeat for one or more days. ° °You may be referred to a heart specialist (cardiologist). °How is this treated? °Treatment for this condition depends on the cause or underlying condition. Treatment may involve: °· Treating the underlying condition. °· Taking new medicines or changing your current medicines as told by your health care provider. °· Making changes to your diet or lifestyle. °· Practicing relaxation methods. ° °Follow these instructions at home: °Lifestyle °· Do not use any products that contain nicotine or tobacco, such as cigarettes and e-cigarettes. If you need help quitting, ask your  health care provider. °· Learn relaxation methods, like deep breathing, to help you when you get stressed or anxious. °· Do not use illegal drugs, such as cocaine. °· Do not abuse alcohol. Limit alcohol intake to no more than 1 drink a day for non-pregnant women and 2 drinks a day for men. One drink equals 12 oz of beer, 5 oz of wine, or 1½ oz of hard liquor. °· Find time to rest and relax often. This reduces stress. °· Avoid: °? Caffeine. °? Stimulants such as over-the-counter diet pills or pills that help you to stay awake. °? Situations that cause anxiety or stress. °General instructions °· Drink enough fluids to keep your urine clear or pale yellow. °· Take over-the-counter and prescription medicines only as told by your health care provider. °· Keep all follow-up visits as told by your health care provider. This is important. °Contact a health care provider if: °· You have a fever. °· You have vomiting or diarrhea that keeps happening (is persistent). °Get help right away if: °· You have pain in your chest, upper arms, jaw, or neck. °· You become weak or dizzy. °· You feel faint. °· You have palpitations that do not go away. °This information is not intended to replace advice given to you by your health care provider. Make sure you discuss any questions you have with your health care provider. °Document Released: 06/17/2004 Document Revised: 12/06/2015 Document Reviewed: 11/22/2014 °Elsevier Interactive Patient Education © 2018 [employer] ° °

## 2017-10-28 NOTE — Plan of Care (Signed)
  Problem: Safety: Goal: Ability to remain free from injury will improve Outcome: Progressing   

## 2017-10-28 NOTE — Progress Notes (Signed)
New referral for outpatient Palliative to follow at [facility] received from [place]. Plan is for discharge today. Patient information faxed to referral. Flo Shanks RN, BSN, [facility] [place], hospital Liaison [phone]

## 2017-10-28 NOTE — Plan of Care (Signed)
  Problem: Safety: Goal: Ability to remain free from injury will improve Outcome: Progressing  Continued reinforcement  Problem: Education: Goal: Knowledge of secondary prevention will improve Outcome: Progressing

## 2017-10-28 NOTE — Discharge Summary (Signed)
[facility] NAME: [name]    MR#:  [id number]  DATE OF BIRTH:  10/20/1934  DATE OF ADMISSION:  10/14/2017 ADMITTING PHYSICIAN: Arta Silence, MD  DATE OF DISCHARGE: 10/28/2017  PRIMARY CARE PHYSICIAN: Alanson Aly, FNP    ADMISSION DIAGNOSIS:  Ventricular tachycardia ([place]) [I47.2]  DISCHARGE DIAGNOSIS:  Active Problems:   Atrial fibrillation with rapid ventricular response ([place])   SECONDARY DIAGNOSIS:   Past Medical History:  Diagnosis Date  . Arthritis   . Atrial fibrillation ([place])   . Carcinoma of prostate ([place])   . CHF (congestive heart failure) ([place])   . Chronic kidney disease   . Diabetes mellitus without complication ([place])   . ED (erectile dysfunction)   . Frequent urination   . Hyperlipidemia   . Hypertension   . Moderate mitral insufficiency   . Peripheral vascular disease ([place])   . Pneumonia 04/2016  . Prostate cancer ([place])   . Sleep apnea    OSA--USE C-PAP  . Ulcer of left foot due to type 2 diabetes mellitus ([place])   . Urinary stress incontinence, male     HOSPITAL COURSE:   83 year old male with PAF on anticoagulation, diabetes and peripheral vascular disease with left BKA who presented May 25 with hyperglycemia and since admission has had several complications.  1.  Non-STEMI: Patient is poor candidate for invasive work-up as per cardiology He will continue medical management with aspirin, statin, metoprolol Troponin max 17.51 He will have outpatient cardiology follow-up.  2.  Metabolic encephalopathy in the setting of 7 mm subacute right cerebral infarct: He will continue aspirin, Xarelto and statin Echocardiogram shows EF 20 to 25% with akinesis of apical myocardium Carotid Doppler showed no hemodynamically significant stenosis  3.  Acute on chronic systolic heart failure EF 20 to 25%: Patient currently euvolemic.  He will be referred to CHF clinic upon discharge.  4.  PAF:  Continue digoxin, diltiazem and metoprolol  for heart rate control Continue Xarelto for anticoagulation   5.  Diabetes: Continue ADA diet NovoLog and Lantus   6.  PAD with ulceration right lower extremity: Plan was for right lower extremity angiogram and possible revascularization however due to CVA he is at very high risk for anesthesia. Wound care:Cleanse right lower leg wound with normal saline. Pat dry. Apply Xeroform over the wound, then wrap in kerlex.  Change daily.  I spoke with patient's wife yesterday and she does not want any surgery due to patient's very high risk of surgical complications.     Patient will follow-up with vascular surgery in 3 weeks   Palliative care consult outpatient   DISCHARGE CONDITIONS AND DIET:   Stable  Stage III diet with aspiration precautions  CONSULTS OBTAINED:  Treatment Team:  Arta Silence, MD Isaias Cowman, MD Schnier, Dolores Lory, MD Ardis Hughs, MD Melvenia Beam, MD  DRUG ALLERGIES:   Allergies  Allergen Reactions  . Contrast Media [Iodinated Diagnostic Agents] Shortness Of Breath    SOB  . Iohexol Shortness Of Breath     Desc: Respiratory Distress, Laryngedema, diaphoresis, Onset Date: [id number]   . Metrizamide Shortness Of Breath    SOB SOB SOB   . Latex Rash    DISCHARGE MEDICATIONS:   Allergies as of 10/28/2017      Reactions   Contrast Media [iodinated Diagnostic Agents] Shortness Of Breath   SOB   Iohexol Shortness Of Breath    Desc: Respiratory Distress, Laryngedema, diaphoresis,  Onset Date: [id number]   Metrizamide Shortness Of Breath   SOB SOB SOB   Latex Rash      Medication List    STOP taking these medications   AMITIZA 24 MCG capsule Generic drug:  lubiprostone   docusate sodium 100 MG capsule Commonly known as:  COLACE   famotidine 20 MG tablet Commonly known as:  PEPCID   furosemide 20 MG tablet Commonly known as:  LASIX   JARDIANCE 10 MG Tabs  tablet Generic drug:  empagliflozin   meloxicam 7.5 MG tablet Commonly known as:  MOBIC   metFORMIN 1000 MG tablet Commonly known as:  GLUCOPHAGE   mirabegron ER 25 MG Tb24 tablet Commonly known as:  MYRBETRIQ   naproxen sodium 220 MG tablet Commonly known as:  ALEVE   oxyCODONE 10 mg 12 hr tablet Commonly known as:  OXYCONTIN   oxyCODONE 5 MG immediate release tablet Commonly known as:  Oxy IR/ROXICODONE   oxyCODONE-acetaminophen 5-325 MG tablet Commonly known as:  PERCOCET/ROXICET   silver sulfADIAZINE 1 % cream Commonly known as:  SILVADENE   simethicone 80 MG chewable tablet Commonly known as:  MYLICON   TRULICITY 2.35 TD/3.2KG Sopn Generic drug:  Dulaglutide     TAKE these medications   aspirin EC 81 MG tablet Take 81 mg by mouth daily.   atorvastatin 80 MG tablet Commonly known as:  LIPITOR Take 80 mg by mouth daily at 6 PM.   collagenase ointment Commonly known as:  SANTYL Apply topically daily. Start taking on:  10/29/2017   digoxin 0.125 MG tablet Commonly known as:  LANOXIN Take 1 tablet (0.125 mg total) by mouth daily.   diltiazem 240 MG 24 hr capsule Commonly known as:  CARDIZEM CD Take 300 mg by mouth daily.   insulin aspart 100 UNIT/ML injection Commonly known as:  novoLOG Inject 0-5 Units into the skin at bedtime. What changed:  when to take this   insulin glargine 100 UNIT/ML injection Commonly known as:  LANTUS Inject 0.1 mLs (10 Units total) into the skin at bedtime.   lamoTRIgine 25 MG tablet Commonly known as:  LAMICTAL Take 100 mg by mouth 2 (two) times daily.   metoprolol succinate 50 MG 24 hr tablet Commonly known as:  TOPROL-XL Take 3 tablets (150 mg total) by mouth daily. Take with or immediately following a meal. Start taking on:  10/29/2017 What changed:    medication strength  how much to take  additional instructions   potassium chloride SA 20 MEQ tablet Commonly known as:  K-DUR,KLOR-CON Take by mouth.    protein supplement shake Liqd Commonly known as:  PREMIER PROTEIN Take 325 mLs (11 oz total) by mouth 2 (two) times daily between meals.   risperiDONE 0.5 MG tablet Commonly known as:  RISPERDAL Take 1 tablet (0.5 mg total) by mouth at bedtime.   rivaroxaban 20 MG Tabs tablet Commonly known as:  XARELTO Take 1 tablet (20 mg total) by mouth daily with supper. What changed:    medication strength  how much to take  when to take this         Today   CHIEF COMPLAINT:  Patient confused   VITAL SIGNS:  Blood pressure (!) 152/83, pulse (!) 103, temperature 98 F (36.7 C), temperature source Oral, resp. rate 18, height 5\' 7"  (1.702 m), weight 87.5 kg (193 lb), SpO2 97 %.   REVIEW OF SYSTEMS:  Review of Systems  Unable to perform ROS: Dementia     PHYSICAL  EXAMINATION:  GENERAL:  83 y.o.-year-old patient lying in the bed with no acute distress.  NECK:  Supple, no jugular venous distention. No thyroid enlargement, no tenderness.  LUNGS: Normal breath sounds bilaterally, no wheezing, rales,rhonchi  No use of accessory muscles of respiration.  CARDIOVASCULAR: S1, S2 normal. No murmurs, rubs, or gallops.  ABDOMEN: Soft, non-tender, non-distended. Bowel sounds present. No organomegaly or mass.  EXTREMITIES: left AKA PSYCHIATRIC: The patient is alert and oriented x 3.  SKIN: eschar on shin   DATA REVIEW:   CBC Recent Labs  Lab 10/27/17 0745  WBC 12.6*  HGB 16.7  HCT 49.9  PLT 266    Chemistries  Recent Labs  Lab 10/24/17 0454  10/27/17 0745  NA 139   < > 134*  K 3.7   < > 4.6  CL 106   < > 104  CO2 23   < > 20*  GLUCOSE 94   < > 90  BUN 29*   < > 27*  CREATININE 0.93   < > 0.91  CALCIUM 8.1*   < > 8.2*  AST 32  --   --   ALT 34  --   --   ALKPHOS 167*  --   --   BILITOT 1.2  --   --    < > = values in this interval not displayed.    Cardiac Enzymes No results for input(s): TROPONINI in the last 168 hours.  Microbiology Results   @MICRORSLT48 @  RADIOLOGY:  No results found.    Allergies as of 10/28/2017      Reactions   Contrast Media [iodinated Diagnostic Agents] Shortness Of Breath   SOB   Iohexol Shortness Of Breath    Desc: Respiratory Distress, Laryngedema, diaphoresis, Onset Date: [id number]   Metrizamide Shortness Of Breath   SOB SOB SOB   Latex Rash      Medication List    STOP taking these medications   AMITIZA 24 MCG capsule Generic drug:  lubiprostone   docusate sodium 100 MG capsule Commonly known as:  COLACE   famotidine 20 MG tablet Commonly known as:  PEPCID   furosemide 20 MG tablet Commonly known as:  LASIX   JARDIANCE 10 MG Tabs tablet Generic drug:  empagliflozin   meloxicam 7.5 MG tablet Commonly known as:  MOBIC   metFORMIN 1000 MG tablet Commonly known as:  GLUCOPHAGE   mirabegron ER 25 MG Tb24 tablet Commonly known as:  MYRBETRIQ   naproxen sodium 220 MG tablet Commonly known as:  ALEVE   oxyCODONE 10 mg 12 hr tablet Commonly known as:  OXYCONTIN   oxyCODONE 5 MG immediate release tablet Commonly known as:  Oxy IR/ROXICODONE   oxyCODONE-acetaminophen 5-325 MG tablet Commonly known as:  PERCOCET/ROXICET   silver sulfADIAZINE 1 % cream Commonly known as:  SILVADENE   simethicone 80 MG chewable tablet Commonly known as:  MYLICON   TRULICITY 6.37 CH/8.8FO Sopn Generic drug:  Dulaglutide     TAKE these medications   aspirin EC 81 MG tablet Take 81 mg by mouth daily.   atorvastatin 80 MG tablet Commonly known as:  LIPITOR Take 80 mg by mouth daily at 6 PM.   collagenase ointment Commonly known as:  SANTYL Apply topically daily. Start taking on:  10/29/2017   digoxin 0.125 MG tablet Commonly known as:  LANOXIN Take 1 tablet (0.125 mg total) by mouth daily.   diltiazem 240 MG 24 hr capsule Commonly known as:  CARDIZEM CD Take  300 mg by mouth daily.   insulin aspart 100 UNIT/ML injection Commonly known as:  novoLOG Inject 0-5 Units into the  skin at bedtime. What changed:  when to take this   insulin glargine 100 UNIT/ML injection Commonly known as:  LANTUS Inject 0.1 mLs (10 Units total) into the skin at bedtime.   lamoTRIgine 25 MG tablet Commonly known as:  LAMICTAL Take 100 mg by mouth 2 (two) times daily.   metoprolol succinate 50 MG 24 hr tablet Commonly known as:  TOPROL-XL Take 3 tablets (150 mg total) by mouth daily. Take with or immediately following a meal. Start taking on:  10/29/2017 What changed:    medication strength  how much to take  additional instructions   potassium chloride SA 20 MEQ tablet Commonly known as:  K-DUR,KLOR-CON Take by mouth.   protein supplement shake Liqd Commonly known as:  PREMIER PROTEIN Take 325 mLs (11 oz total) by mouth 2 (two) times daily between meals.   risperiDONE 0.5 MG tablet Commonly known as:  RISPERDAL Take 1 tablet (0.5 mg total) by mouth at bedtime.   rivaroxaban 20 MG Tabs tablet Commonly known as:  XARELTO Take 1 tablet (20 mg total) by mouth daily with supper. What changed:    medication strength  how much to take  when to take this         Management plans discussed with the patient's wife and she is in agreement. Stable for discharge   Patient should follow up with vascular  CODE STATUS:     Code Status Orders  (From admission, onward)        Start     Ordered   10/26/17 1535  Full code  Continuous     10/26/17 1534    Code Status History    Date Active Date Inactive Code Status Order ID Comments User Context   10/26/2017 1437 10/26/2017 1534 Full Code [id number]  Feliberto Gottron, [place] Inpatient   10/24/2017 1152 10/26/2017 1437 DNR [id number]  Bettey Costa, MD Inpatient   10/15/2017 0309 10/24/2017 1152 Full Code [id number]  Arta Silence, MD Inpatient   02/10/2017 1904 02/12/2017 1908 Full Code [id number]  Schnier, Dolores Lory, MD ED   02/10/2017 1638 02/10/2017 1904 Full Code [id number]  Sela Hua, PA-C Outpatient    01/08/2017 1342 01/19/2017 2103 Full Code [id number]  Loletha Grayer, MD ED   12/29/2016 2232 12/30/2016 1634 Full Code [id number]  Lance Coon, MD ED   12/20/2016 1251 12/20/2016 1842 Full Code [id number]  Algernon Huxley, MD Inpatient   10/22/2016 0040 10/24/2016 2058 Full Code [id number]  Lance Coon, MD Inpatient   10/14/2016 1201 10/14/2016 1925 Full Code [id number]  Algernon Huxley, MD Inpatient   08/30/2016 1338 09/06/2016 1941 Full Code [id number]  Epifanio Lesches, MD Inpatient   05/19/2016 0454 05/22/2016 1907 Full Code [id number]  Saundra Shelling, MD Inpatient      TOTAL TIME TAKING CARE OF THIS PATIENT: 38 minutes.    Note: This dictation was prepared with Dragon dictation along with smaller phrase technology. Any transcriptional errors that result from this process are unintentional.  Geral Tuch M.D on 10/28/2017 at 10:12 AM  Between 7am to 6pm - Pager - [phone] After 6pm go to [email or web address] - password [email or web address] [facility]  Office  [phone]  CC: Primary care physician; Alanson Aly, [place]

## 2017-10-28 NOTE — Progress Notes (Signed)
Daily Progress Note   Patient Name: [name]       Date: 10/28/2017 DOB: [birth year]  Age: 83 y.o. MRN#: [id number] Attending Physician: Jesse Costa, MD Primary Care Physician: Jesse Aly, FNP Admit Date: 10/14/2017  Reason for Consultation/Follow-up: Establishing goals of care  Subjective: Patient is lying in bed awake and alert. Able to identify name and dob today. Staff at the bedside for care prior to discharge. He denies pain or discomfort. Spoke with his wife who verbalized understanding of current plan to discharge to [place] via EMS. She is aware that outpatient palliative information (referral) has been faxed. She confirms that family continues to want patient to be a full code due to improvement in mental status. Support given. Family appreciative of care and service while hospitalized.    Chart reviewed and report received from bedside RN.   Length of Stay: 13  Current Medications: Scheduled Meds:  . aspirin  81 mg Oral Daily  . atorvastatin  80 mg Oral q1800  . collagenase   Topical Daily  . digoxin  0.125 mg Oral Daily  . diltiazem  300 mg Oral Daily  . famotidine  20 mg Oral BID  . haloperidol lactate  1 mg Intramuscular Once  . insulin aspart  0-5 Units Subcutaneous QHS  . insulin aspart  0-9 Units Subcutaneous TID WC  . insulin glargine  10 Units Subcutaneous QHS  . lamoTRIgine  100 mg Oral BID  . mouth rinse  15 mL Mouth Rinse BID  . metoprolol succinate  150 mg Oral Daily  . multivitamin-lutein  1 capsule Oral Daily  . polyethylene glycol  17 g Oral Daily  . potassium chloride  20 mEq Oral BID  . protein supplement shake  11 oz Oral BID BM  . risperiDONE  0.5 mg Oral QHS  . rivaroxaban  20 mg Oral Q supper  . senna-docusate  1  tablet Oral Daily  . sodium chloride flush  3 mL Intravenous Q12H  . ziprasidone  10 mg Intramuscular Once    Continuous Infusions:   PRN Meds: acetaminophen, bisacodyl, dextrose, haloperidol lactate, LORazepam, metoprolol tartrate, ondansetron (ZOFRAN) IV, senna-docusate  Physical Exam  Constitutional: He appears well-developed. He is cooperative.  Cardiovascular: Regular rhythm and normal heart sounds. Tachycardia present. Exam reveals decreased pulses.  2 pedal   Pulmonary/Chest: Effort normal. He has decreased breath sounds.  Abdominal: Soft. Normal appearance.  Musculoskeletal:  Generalized weakness, left leg amputation w/prosthesis   Neurological: He is alert.  Skin: Skin is warm and dry.  Right leg ulceration (bandaged), LBKA  Psychiatric: Cognition and memory are impaired. He expresses inappropriate judgment.  Confusion   Nursing note and vitals reviewed.           Vital Signs: BP (!) 152/83 (BP Location: Left Arm)   Pulse (!) 103   Temp 98 F (36.7 C) (Oral)   Resp 18   Ht 5\' 7"  (1.702 m)   Wt 87.5 kg (193 lb)   SpO2 97%   BMI 30.23 kg/m  SpO2: SpO2: 97 % O2 Device: O2 Device: Room Air O2 Flow Rate: O2 Flow Rate (L/min): 7.5 L/min  Intake/output summary:   Intake/Output Summary (Last 24 hours) at 10/28/2017 1301 Last data filed at 10/28/2017 4081 Gross per 24 hour  Intake 120 ml  Output 350 ml  Net -230 ml   LBM: Last BM Date: 10/27/17 Baseline Weight: Weight: 81.6 kg (180 lb) Most recent weight: Weight: 87.5 kg (193 lb)       Palliative Assessment/Data:PPS 30%   Flowsheet Rows     Most Recent Value  Intake Tab  Referral Department  Hospitalist  Unit at Time of Referral  Med/Surg Unit  Palliative Care Primary Diagnosis  Cardiac  Date Notified  10/19/17  Palliative Care Type  New Palliative care  Reason for referral  Clarify Goals of Care  Date of Admission  10/14/17  Date first seen by Palliative Care  10/19/17  # of days Palliative referral  response time  0 Day(s)  # of days IP prior to Palliative referral  5  Clinical Assessment  Psychosocial & Spiritual Assessment  Palliative Care Outcomes      Patient Active Problem List   Diagnosis Date Noted  . Atrial fibrillation with rapid ventricular response ([place]) 10/15/2017  . BKA stump complication ([place]) 44/81/8563  . 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 S/P BKA (below knee amputation) unilateral, left ([place]) 02/01/2017  . Chronic diastolic heart failure ([place]) 01/21/2017  . Pressure injury of skin 01/18/2017  . 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 Diabetic foot ulcer ([place]) 12/29/2016  . Ataxia 10/21/2016  . History of femoral angiogram 09/06/2016  . Left leg pain 09/06/2016  . Leukocytosis 09/06/2016  . Generalized weakness 09/06/2016  . A-fib ([place]) 08/30/2016  . Pneumonia 05/19/2016  . Hyperlipidemia 04/20/2016  . Back pain 04/19/2016  . Type 2 diabetes mellitus treated with insulin ([place]) 04/19/2016  . Essential hypertension 04/19/2016  . PAD (peripheral artery disease) ([place]) 04/19/2016  . Erectile dysfunction following radical prostatectomy 06/25/2015  . History of prostate cancer 12/23/2014  . Incontinence 12/23/2014    Palliative Care Assessment & Plan   Patient Profile: [name] a29 y.o.malewith a known history of AF (Xarelto), DM, PVD, T2IDDM, AF + RVR. Pt w/ L BKA, RLE wound.  Recommendations/Plan: FULL CODE at family's request Continue to treat the treatable at family's request. Family verbalized plans and agreement for patient to be transported via non-emergent ems to [place] as requested. Family is aware that outpatient Palliative services referral has been sent.   Palliative Medicine team will continue to support patient, family, and medical team during hospitalization as needed. Thank you for allowing [place] to participate in patient's care.  Goals  of Care and Additional Recommendations: Limitations on Scope of Treatment: Full Scope Treatment-continue to treat the treatable.   Code Status: FULL CODE    Code Status Orders  (From admission, onward)        Start     Ordered   10/24/17 1152  Do not attempt resuscitation (DNR)  Continuous    Question Answer Comment  In the event of cardiac or respiratory ARREST Do not call a "code blue"   In the event of cardiac or respiratory ARREST Do not perform Intubation, CPR, defibrillation or ACLS   In the event of cardiac or respiratory ARREST Use medication by any route, position, wound care, and other measures to relive pain and suffering. May use oxygen, suction and manual treatment of airway obstruction as needed for comfort.      10/24/17 1152    Code Status History    Date Active Date Inactive Code Status Order ID Comments User Context   10/15/2017 0309 10/24/2017 1152 Full Code [id number]  Arta Silence, MD Inpatient   02/10/2017 1904 02/12/2017 1908 Full Code [id number]  Katha Cabal, MD ED   02/10/2017 1638 02/10/2017 1904 Full Code [id number]  Sela Hua, PA-C Outpatient   01/08/2017 1342 01/19/2017 2103 Full Code [id number]  Loletha Grayer, MD ED   12/29/2016 2232 12/30/2016 1634 Full Code [id number]  Lance Coon, MD ED   12/20/2016 1251 12/20/2016 1842 Full Code [id number]  Algernon Huxley, MD Inpatient   10/22/2016 0040 10/24/2016 2058 Full Code [id number]  Lance Coon, MD Inpatient   10/14/2016 1201 10/14/2016 1925 Full Code [id number]  Algernon Huxley, MD Inpatient   08/30/2016 1338 09/06/2016 1941 Full Code [id number]  Epifanio Lesches, MD Inpatient   05/19/2016 0454 05/22/2016 1907 Full Code [id number]  Saundra Shelling, MD Inpatient      Prognosis:  Unable to determine-guarded to poor in the setting of confusion, decreased mobility, PVD, hypertension, tachycardia, diabetes, chronic kidney disease, a-fibb, Non-Stemi (not a candidate for invasive work-up) Troponin max 45.85,  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   Discharge Planning: [place] for rehab with Palliative care service follow-up  Care plan was discussed with patient's wife and bedside RN.   Thank you for allowing the Palliative Medicine Team to assist in the care of this patient.   Total Time 25 min.  Prolonged Time Billed  NO      Greater than 50%  of this time was spent counseling and coordinating care related to the above assessment and plan.  Alda Lea, NP-BC Palliative Medicine Team  Phone: [phone] Fax: [phone]  Please contact Palliative Medicine Team phone at [phone] for questions and concerns.

## 2017-10-28 NOTE — Clinical Social Work Placement (Signed)
   CLINICAL SOCIAL WORK PLACEMENT  NOTE  Date:  10/28/2017  Patient Details  Name: [name] MRN: [id number] Date of Birth: 07-01-1934  Clinical Social Work is seeking post-discharge placement for this patient at the [place] level of care (*CSW will initial, date and re-position this form in  chart as items are completed):  Yes   Patient/family provided with [facility] Work Department's list of facilities offering this level of care within the geographic area requested by the patient (or if unable, by the patient's family).  Yes   Patient/family informed of their freedom to choose among providers that offer the needed level of care, that participate in Medicare, Medicaid or managed care program needed by the patient, have an available bed and are willing to accept the patient.  Yes   Patient/family informed of [facility]'s ownership interest in [facility] and [facility], as well as of the fact that they are under no obligation to receive care at these facilities.  PASRR submitted to EDS on 10/25/17     PASRR number received on       Existing PASRR number confirmed on 10/25/17     FL2 transmitted to all facilities in geographic area requested by pt/family on 10/25/17     FL2 transmitted to all facilities within larger geographic area on       Patient informed that his/her managed care company has contracts with or will negotiate with certain facilities, including the following:        Yes   Patient/family informed of bed offers received.  Patient [facility] bed at [facility]     Physician recommends and patient chooses bed at      Patient to be transferred to [facility] on 10/28/17.  Patient to be transferred to facility by [facility]     Patient family notified on 10/28/17 of transfer.  Name of family member notified:  Patient's wife [name]     PHYSICIAN Please prepare  prescriptions, Please prepare priority discharge summary, including medications, Please sign FL2     Additional Comment:    _______________________________________________ Ross Ludwig, LCSWA 10/28/2017, 11:46 AM

## 2017-10-28 NOTE — Care Management Important Message (Signed)
Copy of signed IM left in patient's room.    

## 2017-10-28 NOTE — Clinical Social Work Note (Signed)
Patient to be d/c'ed today to [facility] room E14.  Patient and family agreeable to plans will transport via ems RN to call report to [phone].  CSW updated patient's wife Everlene Farrier [phone] that patient will be discharging today.   Evette Cristal, MSW, [place]

## 2017-10-28 NOTE — Progress Notes (Signed)
Called facility and gave nurse-to-nurse report at this time. All questions answered. [facility] for non-emergent patient transport to the facility at this time. NT's are getting patient dressed now. Wenda Low [facility]

## 2017-11-18 ENCOUNTER — Ambulatory Visit: Payer: Medicare Other

## 2017-11-18 ENCOUNTER — Ambulatory Visit (INDEPENDENT_AMBULATORY_CARE_PROVIDER_SITE_OTHER): Payer: Medicare Other | Admitting: Vascular Surgery

## 2017-11-18 ENCOUNTER — Encounter (INDEPENDENT_AMBULATORY_CARE_PROVIDER_SITE_OTHER): Payer: Self-pay | Admitting: Vascular Surgery

## 2017-11-18 VITALS — BP 110/63 | HR 78 | Resp 16 | Wt 182.0 lb

## 2017-11-18 DIAGNOSIS — I10 Essential (primary) hypertension: Secondary | ICD-10-CM

## 2017-11-18 DIAGNOSIS — I7025 Atherosclerosis of native arteries of other extremities with ulceration: Secondary | ICD-10-CM

## 2017-11-18 DIAGNOSIS — Z794 Long term (current) use of insulin: Secondary | ICD-10-CM | POA: Diagnosis not present

## 2017-11-18 DIAGNOSIS — I482 Chronic atrial fibrillation, unspecified: Secondary | ICD-10-CM

## 2017-11-18 DIAGNOSIS — E119 Type 2 diabetes mellitus without complications: Secondary | ICD-10-CM

## 2017-11-18 DIAGNOSIS — Z89512 Acquired absence of left leg below knee: Secondary | ICD-10-CM | POA: Diagnosis not present

## 2017-11-18 NOTE — Assessment & Plan Note (Signed)
Originally planned a right leg angiogram if you would ago but a stroke and mental status changes cause this to be delayed.  Patient is near his baseline.  The family is hesitant to proceed with any angiography or other procedures until he is assessed by primary care provider which is very reasonable.  Since his regular primary care provider has left, we are going to try to get him hooked up with another primary care provider within the same practice.  Once he has had this assessment, his family is going to call if they would like to schedule an angiogram.  He does have a critical and limb threatening situation.

## 2017-11-18 NOTE — Progress Notes (Signed)
MRN : [id number]  [name] is a 83 y.o. (16-Sep-1934) male who presents with chief complaint of  Chief Complaint  Patient presents with  . Follow-up    check wound on right leg  .  History of Present Illness: Patient returns today in follow up of his right lower leg wound.  This is slightly better than it was a few weeks ago in the hospital, but he still has some pale eschar and it has been very slow to heal.  It is mildly painful.  No fevers or chills.  No significant drainage.  He is walking with his left below-knee prosthesis currently.  He is still in a nursing facility.  His primary care provider has apparently left their practice and he has not been seen by his primary service in several months now.  His family is very concerned about that  Current Outpatient Medications  Medication Sig Dispense Refill  . aspirin EC 81 MG tablet Take 81 mg by mouth daily.    Marland Kitchen atorvastatin (LIPITOR) 80 MG tablet Take 80 mg by mouth daily at 6 PM.     . collagenase (SANTYL) ointment Apply topically daily. 15 g 0  . digoxin (LANOXIN) 0.125 MG tablet Take 1 tablet (0.125 mg total) by mouth daily.    . insulin aspart (NOVOLOG) 100 UNIT/ML injection Inject 0-5 Units into the skin at bedtime. 10 mL 11  . insulin glargine (LANTUS) 100 UNIT/ML injection Inject 0.1 mLs (10 Units total) into the skin at bedtime. 10 mL 11  . lamoTRIgine (LAMICTAL) 25 MG tablet Take 100 mg by mouth 2 (two) times daily.     . metoprolol succinate (TOPROL-XL) 50 MG 24 hr tablet Take 3 tablets (150 mg total) by mouth daily. Take with or immediately following a meal.    . potassium chloride SA (K-DUR,KLOR-CON) 20 MEQ tablet Take by mouth.    . protein supplement shake (PREMIER PROTEIN) LIQD Take 325 mLs (11 oz total) by mouth 2 (two) times daily between meals. 19500 mL 0  . risperiDONE (RISPERDAL) 0.5 MG tablet Take 1 tablet (0.5 mg total) by mouth at bedtime. 30 tablet 0  . rivaroxaban (XARELTO) 20 MG TABS tablet Take 1  tablet (20 mg total) by mouth daily with supper. 30 tablet 0  . diltiazem (CARDIZEM CD) 240 MG 24 hr capsule Take 300 mg by mouth daily.      No current facility-administered medications for this visit.    Facility-Administered Medications Ordered in Other Visits  Medication Dose Route Frequency Provider Last Rate Last Dose  . acetaminophen (TYLENOL) tablet 650 mg  650 mg Oral Q6H PRN Stegmayer, Kimberly A, PA-C       Or  . acetaminophen (TYLENOL) suppository 650 mg  650 mg Rectal Q6H PRN Stegmayer, Kimberly A, PA-C      . ondansetron (ZOFRAN) tablet 4 mg  4 mg Oral Q6H PRN Stegmayer, Kimberly A, PA-C       Or  . ondansetron (ZOFRAN) 4 mg in sodium chloride 0.9 % 50 mL IVPB  4 mg Intravenous Q6H PRN Stegmayer, Janalyn Harder, PA-C        Past Medical History:  Diagnosis Date  . Arthritis   . Atrial fibrillation ([place])   . Carcinoma of prostate ([place])   . CHF (congestive heart failure) ([place])   . Chronic kidney disease   . Diabetes mellitus without complication ([place])   . ED (erectile dysfunction)   . Frequent urination   . Hyperlipidemia   .  Hypertension   . Moderate mitral insufficiency   . Peripheral vascular disease ([place])   . Pneumonia 04/2016  . Prostate cancer ([place])   . Sleep apnea    OSA--USE C-PAP  . Ulcer of left foot due to type 2 diabetes mellitus ([place])   . Urinary stress incontinence, male     Past Surgical History:  Procedure Laterality Date  . AMPUTATION Left 01/12/2017   Procedure: AMPUTATION BELOW KNEE;  Surgeon: Algernon Huxley, MD;  Location: [facility];  Service: General;  Laterality: Left;  . APLIGRAFT PLACEMENT Left 10/15/2016   Procedure: APLIGRAFT PLACEMENT;  Surgeon: Albertine Patricia, DPM;  Location: [facility];  Service: Podiatry;  Laterality: Left;  necrotic ulcer  . CHOLECYSTECTOMY    . EYE SURGERY Bilateral    Cataract Extraction with IOL  . IRRIGATION AND DEBRIDEMENT FOOT Left 10/15/2016   Procedure: IRRIGATION AND DEBRIDEMENT FOOT-EXCISIONAL DEBRIDEMENT OF  SKIN 3RD, 4TH AND 5TH TOES WITH APPLICATION OF APLIGRAFT;  Surgeon: Albertine Patricia, DPM;  Location: [facility];  Service: Podiatry;  Laterality: Left;  necrotic,gangrene  . IRRIGATION AND DEBRIDEMENT FOOT Left 12/09/2016   Procedure: IRRIGATION AND DEBRIDEMENT FOOT-MUSCLE/FASCIA, RESECTION OF FOURTH AND FIFTH METATARSAL NECROTIC BONE;  Surgeon: Albertine Patricia, DPM;  Location: [facility];  Service: Podiatry;  Laterality: Left;  . LOWER EXTREMITY ANGIOGRAPHY Left 08/16/2016   Procedure: Lower Extremity Angiography;  Surgeon: Algernon Huxley, MD;  Location: [facility];  Service: Cardiovascular;  Laterality: Left;  . LOWER EXTREMITY ANGIOGRAPHY Left 09/01/2016   Procedure: Lower Extremity Angiography;  Surgeon: Algernon Huxley, MD;  Location: [facility];  Service: Cardiovascular;  Laterality: Left;  . LOWER EXTREMITY ANGIOGRAPHY Left 10/14/2016   Procedure: Lower Extremity Angiography;  Surgeon: Algernon Huxley, MD;  Location: [facility];  Service: Cardiovascular;  Laterality: Left;  . LOWER EXTREMITY ANGIOGRAPHY Left 12/20/2016   Procedure: Lower Extremity Angiography;  Surgeon: Algernon Huxley, MD;  Location: [facility];  Service: Cardiovascular;  Laterality: Left;  . LOWER EXTREMITY INTERVENTION  09/01/2016   Procedure: Lower Extremity Intervention;  Surgeon: Algernon Huxley, MD;  Location: [facility];  Service: Cardiovascular;;  . PROSTATE SURGERY     removal  . TONSILLECTOMY     as a child    Family History  Problem Relation Age of Onset  . Prostate cancer Neg Hx   . Bladder Cancer Neg Hx   . Kidney disease Neg Hx   No bleeding disorders, clotting disorders, autoimmune diseases, or aneurysms  Social History      Social History  Substance Use Topics  . Smoking status: Former Smoker    Quit date: 10/14/1994  . Smokeless tobacco: Never Used  . Alcohol use No  Married, wife accompanies today Son and daughter are also present today.         Allergies  Allergen Reactions  . Contrast Media [Iodinated Diagnostic Agents]     SOB  . Iohexol     Desc: Respiratory Distress, Laryngedema, diaphoresis, Onset Date: [id number]   . Latex Rash       REVIEW OF SYSTEMS(Negative unless checked)  Constitutional: '[]' Weight loss'[]' Fever'[]' Chills Cardiac:'[]' Chest pain'[]' Chest pressure'[x]' Palpitations '[]' Shortness of breath when laying flat '[]' Shortness of breath at rest '[x]' Shortness of breath with exertion. Vascular: '[x]' Pain in legs with walking'[]' Pain in legsat rest'[]' Pain in legs when laying flat '[]' Claudication '[x]' Pain in feet when walking '[x]' Pain in feet at rest '[x]' Pain in feet when laying flat '[]' History of DVT '[]' Phlebitis '[x]' Swelling in legs '[]' Varicose  veins '[x]' Non-healing ulcers Pulmonary: '[]' Uses home oxygen '[x]' Productive cough'[]' Hemoptysis '[]' Wheeze '[]' COPD '[]' Asthma Neurologic: '[]' Dizziness '[]' Blackouts '[]' Seizures '[]' History of stroke '[]' History of TIA'[]' Aphasia '[]' Temporary blindness'[]' Dysphagia '[]' Weaknessor numbness in arms '[]' Weakness or numbnessin legs Musculoskeletal: '[x]' Arthritis '[]' Joint swelling '[]' Joint pain '[]' Low back pain Hematologic:'[]' Easy bruising'[]' Easy bleeding '[]' Hypercoagulable state '[]' Anemic '[]' Hepatitis Gastrointestinal:'[]' Blood in stool'[]' Vomiting blood'[]' Gastroesophageal reflux/heartburn'[]' Abdominal pain Genitourinary: '[]' Chronic kidney disease '[]' Difficulturination '[]' Frequenturination '[]' Burning with urination'[]' Hematuria Skin: '[]' Rashes '[x]' Ulcers '[x]' Wounds Psychological: '[]' History of anxiety'[]' History of major depression.    Physical Examination  BP 110/63 (BP Location: Left Arm)   Pulse 78   Resp 16   Wt 182 lb (82.6 kg)   BMI 28.51 kg/m  Gen:  WD/WN, NAD Head: [place]/AT, No temporalis wasting. Ear/Nose/Throat: Hearing grossly intact, nares w/o erythema or drainage Eyes: Conjunctiva clear. Sclera  non-icteric Neck: Supple.  Trachea midline Pulmonary:  Good air movement, no use of accessory muscles.  Cardiac: Irregular Vascular:  Vessel Right Left  Radial Palpable Palpable                          PT  not palpable  not palpable  DP Palpable  not palpable    Musculoskeletal: M/S 5/5 throughout.  No deformity or atrophy.  Mild right lower leg edema.  Wound as described above.  Minimal surrounding erythema.  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  Symmetrical.  Speech is fluent.  Psychiatric: Judgment intact, Mood & affect appropriate for pt's clinical situation. Dermatologic: Quarter sized wound on the right lower anterior medial leg with pale eschar and mild surrounding erythema       Labs Recent Results (from the past 2160 hour(s))  BLADDER SCAN AMB NON-IMAGING     Status: None   Collection Time: 09/29/17 11:06 AM  Result Value Ref Range   Scan Result 134m   Blood gas, venous     Status: Abnormal   Collection Time: 10/15/17 12:08 AM  Result Value Ref Range   FIO2 21.00    pH, Ven 7.30 7.250 - 7.430   pCO2, Ven 37 (L) 44.0 - 60.0 mmHg   pO2, Ven 38.0 32.0 - 45.0 mmHg   Bicarbonate 18.2 (L) 20.0 - 28.0 mmol/L   Acid-base deficit 7.4 (H) 0.0 - 2.0 mmol/L   O2 Saturation 65.2 %   Patient temperature 37.0    Collection site VENOUS    Sample type VENOUS     Comment: [facility] [address] [place] [id number] Comprehensive metabolic panel     Status: Abnormal   Collection Time: 10/15/17 12:08 AM  Result Value Ref Range   Sodium 134 (L) 135 - 145 mmol/L   Potassium 4.7 3.5 - 5.1 mmol/L   Chloride 99 (L) 101 - 111 mmol/L   CO2 19 (L) 22 - 32 mmol/L   Glucose, Bld 686 (HH) 65 - 99 mg/dL    Comment: CRITICAL RESULT CALLED TO, READ BACK BY AND VERIFIED WITH JENNA ELLINGTON AT 025955/25/19.PMH   BUN 28 (H) 6 - 20 mg/dL   Creatinine, Ser 1.29 (H) 0.61 - 1.24 mg/dL    Calcium 8.9 8.9 - 10.3 mg/dL   Total Protein 8.2 (H) 6.5 - 8.1 g/dL   Albumin 3.8 3.5 - 5.0 g/dL   AST 33 15 - 41 U/L   ALT 26 17 - 63 U/L   Alkaline Phosphatase 195 (H) 38 - 126 U/L   Total Bilirubin 1.1 0.3 - 1.2 mg/dL   GFR calc non  Af Amer 51 (L) >60 mL/min   GFR calc Af Amer 59 (L) >60 mL/min    Comment: (NOTE) The eGFR has been calculated using the CKD EPI equation. This calculation has not been validated in all clinical situations. eGFR's persistently <60 mL/min signify possible Chronic Kidney Disease.    Anion gap 16 (H) 5 - 15    Comment: Performed at [facility], [address] [place], [place] [address]  Ethanol     Status: None   Collection Time: 10/15/17 12:08 AM  Result Value Ref Range   Alcohol, Ethyl (B) <10 <10 mg/dL    Comment: (NOTE) Lowest detectable limit for serum alcohol is 10 mg/dL. For medical purposes only. Performed at [facility], [address] [place], [place] [address]   Troponin I     Status: Abnormal   Collection Time: 10/15/17 12:08 AM  Result Value Ref Range   Troponin I 0.11 (HH) <0.03 ng/mL    Comment: CRITICAL RESULT CALLED TO, READ BACK BY AND VERIFIED WITH JENNA ELLINGTON AT 9518 10/15/17.PMH Performed at [facility], [address] [place], [place] [address]   Brain natriuretic peptide     Status: Abnormal   Collection Time: 10/15/17 12:08 AM  Result Value Ref Range   B Natriuretic Peptide 459.0 (H) 0.0 - 100.0 pg/mL    Comment: Performed at [facility], [address] [place], [place] [address]  CBC with Differential     Status: Abnormal   Collection Time: 10/15/17 12:08 AM  Result Value Ref Range   WBC 12.9 (H) 3.8 - 10.6 K/uL   RBC 6.24 (H) 4.40 - 5.90 MIL/uL   Hemoglobin 17.5 13.0 - 18.0 g/dL   HCT 53.0 (H) 40.0 - 52.0 %   MCV 84.9 80.0 - 100.0 fL   MCH 28.0 26.0 - 34.0 pg   MCHC 32.9 32.0 - 36.0 g/dL   RDW 18.2 (H) 11.5 - 14.5 %   Platelets 226 150 - 440 K/uL    Comment:  PLATELET CLUMPS NOTED ON SMEAR, COUNT APPEARS ADEQUATE   Neutrophils Relative % 84 %   Neutro Abs 10.9 (H) 1.4 - 6.5 K/uL   Lymphocytes Relative 9 %   Lymphs Abs 1.1 1.0 - 3.6 K/uL   Monocytes Relative 5 %   Monocytes Absolute 0.7 0.2 - 1.0 K/uL   Eosinophils Relative 1 %   Eosinophils Absolute 0.1 0 - 0.7 K/uL   Basophils Relative 1 %   Basophils Absolute 0.1 0 - 0.1 K/uL    Comment: Performed at [facility], [address] [place], [place] [address]  Protime-INR     Status: None   Collection Time: 10/15/17 12:08 AM  Result Value Ref Range   Prothrombin Time 14.4 11.4 - 15.2 seconds   INR 1.13     Comment: Performed at [facility], [address] [place],  [address]  Urinalysis, Complete w Microscopic     Status: Abnormal   Collection Time: 10/15/17 12:08 AM  Result Value Ref Range   Color, Urine STRAW (A) YELLOW   APPearance CLEAR (A) CLEAR   Specific Gravity, Urine 1.027 1.005 - 1.030   pH 5.0 5.0 - 8.0   Glucose, UA >=500 (A) NEGATIVE mg/dL   Hgb urine dipstick SMALL (A) NEGATIVE   Bilirubin Urine NEGATIVE NEGATIVE   Ketones, ur 20 (A) NEGATIVE mg/dL   Protein, ur 30 (A) NEGATIVE mg/dL   Nitrite NEGATIVE NEGATIVE   Leukocytes, UA NEGATIVE NEGATIVE  RBC / HPF 0-5 0 - 5 RBC/hpf   WBC, UA 0-5 0 - 5 WBC/hpf   Bacteria, UA NONE SEEN NONE SEEN   Squamous Epithelial / LPF 0-5 0 - 5    Comment: Performed at [facility], [address] [place], [place] [address]  Digoxin level     Status: Abnormal   Collection Time: 10/15/17 12:08 AM  Result Value Ref Range   Digoxin Level <0.2 (L) 0.8 - 2.0 ng/mL    Comment: Performed at [facility], [address] [place], [place] [address]  Beta-hydroxybutyric acid     Status: Abnormal   Collection Time: 10/15/17 12:08 AM  Result Value Ref Range   Beta-Hydroxybutyric Acid 2.80 (H) 0.05 - 0.27 mmol/L    Comment: Performed at [facility], [address] [place], [place]  [address]  Phosphorus     Status: None   Collection Time: 10/15/17 12:08 AM  Result Value Ref Range   Phosphorus 3.8 2.5 - 4.6 mg/dL    Comment: Performed at [facility], [address] [place], [place] [address]  Magnesium     Status: None   Collection Time: 10/15/17 12:08 AM  Result Value Ref Range   Magnesium 1.9 1.7 - 2.4 mg/dL    Comment: Performed at [facility], [address] [place], [place] [address]  Glucose, capillary     Status: Abnormal   Collection Time: 10/15/17 12:18 AM  Result Value Ref Range   Glucose-Capillary >600 (HH) 65 - 99 mg/dL  Glucose, capillary     Status: Abnormal   Collection Time: 10/15/17  1:19 AM  Result Value Ref Range   Glucose-Capillary 578 (HH) 65 - 99 mg/dL   Comment 1 Document in Chart   Glucose, capillary     Status: Abnormal   Collection Time: 10/15/17  2:33 AM  Result Value Ref Range   Glucose-Capillary 512 (HH) 65 - 99 mg/dL  Glucose, capillary     Status: Abnormal   Collection Time: 10/15/17  3:20 AM  Result Value Ref Range   Glucose-Capillary 456 (H) 65 - 99 mg/dL  MRSA PCR Screening     Status: None   Collection Time: 10/15/17  3:28 AM  Result Value Ref Range   MRSA by PCR NEGATIVE NEGATIVE    Comment:        The GeneXpert MRSA Assay (FDA approved for NASAL specimens only), is one component of a comprehensive MRSA colonization surveillance program. It is not intended to diagnose MRSA infection nor to guide or monitor treatment for MRSA infections. Performed at [facility], [address] [place],  [address]   Glucose, capillary     Status: Abnormal   Collection Time: 10/15/17  4:59 AM  Result Value Ref Range   Glucose-Capillary 312 (H) 65 - 99 mg/dL  Glucose, capillary     Status: Abnormal   Collection Time: 10/15/17  5:59 AM  Result Value Ref Range   Glucose-Capillary 291 (H) 65 - 99 mg/dL  Glucose, capillary     Status: Abnormal   Collection Time: 10/15/17  6:54 AM  Result  Value Ref Range   Glucose-Capillary 233 (H) 65 - 99 mg/dL   Comment 1 Notify RN   Glucose, capillary     Status: Abnormal   Collection Time: 10/15/17  8:04 AM  Result Value Ref Range   Glucose-Capillary 234 (H) 65 - 99 mg/dL   Comment 1 Notify RN   Glucose, capillary  Status: Abnormal   Collection Time: 10/15/17  9:19 AM  Result Value Ref Range   Glucose-Capillary 151 (H) 65 - 99 mg/dL  Glucose, capillary     Status: Abnormal   Collection Time: 10/15/17 10:39 AM  Result Value Ref Range   Glucose-Capillary 158 (H) 65 - 99 mg/dL  Glucose, capillary     Status: Abnormal   Collection Time: 10/15/17 11:41 AM  Result Value Ref Range   Glucose-Capillary 224 (H) 65 - 99 mg/dL  Troponin I (q 6hr x 3)     Status: Abnormal   Collection Time: 10/15/17  1:03 PM  Result Value Ref Range   Troponin I 17.51 (HH) <0.03 ng/mL    Comment: CRITICAL RESULT CALLED TO, READ BACK BY AND VERIFIED WITH SARAH OLEJAR ON 10/15/17 AT 1447 [facility] Performed at [facility], [address], [place], [place] [address]   Basic metabolic panel     Status: Abnormal   Collection Time: 10/15/17  1:09 PM  Result Value Ref Range   Sodium 134 (L) 135 - 145 mmol/L   Potassium 4.2 3.5 - 5.1 mmol/L   Chloride 103 101 - 111 mmol/L   CO2 22 22 - 32 mmol/L   Glucose, Bld 331 (H) 65 - 99 mg/dL   BUN 30 (H) 6 - 20 mg/dL   Creatinine, Ser 1.11 0.61 - 1.24 mg/dL   Calcium 8.4 (L) 8.9 - 10.3 mg/dL   GFR calc non Af Amer >60 >60 mL/min   GFR calc Af Amer >60 >60 mL/min    Comment: (NOTE) The eGFR has been calculated using the CKD EPI equation. This calculation has not been validated in all clinical situations. eGFR's persistently <60 mL/min signify possible Chronic Kidney Disease.    Anion gap 9 5 - 15    Comment: Performed at [facility], [address] [place], [place] [address]  Magnesium     Status: None   Collection Time: 10/15/17  1:09 PM  Result Value Ref Range   Magnesium 1.8 1.7 - 2.4  mg/dL    Comment: Performed at [facility], [address] [place], [place] [address]  Phosphorus     Status: None   Collection Time: 10/15/17  1:09 PM  Result Value Ref Range   Phosphorus 3.9 2.5 - 4.6 mg/dL    Comment: Performed at [facility], [address] [place], [place] [address]  Glucose, capillary     Status: Abnormal   Collection Time: 10/15/17  1:52 PM  Result Value Ref Range   Glucose-Capillary 318 (H) 65 - 99 mg/dL   Comment 1 Notify RN   APTT     Status: None   Collection Time: 10/15/17  3:41 PM  Result Value Ref Range   aPTT 27 24 - 36 seconds    Comment: Performed at [facility], [address] [place], [place] [address]  Protime-INR     Status: None   Collection Time: 10/15/17  3:41 PM  Result Value Ref Range   Prothrombin Time 14.5 11.4 - 15.2 seconds   INR 1.14     Comment: Performed at [facility], [address] [place], [place] [address]  Glucose, capillary     Status: Abnormal   Collection Time: 10/15/17  3:48 PM  Result Value Ref Range   Glucose-Capillary 304 (H) 65 - 99 mg/dL  ECHOCARDIOGRAM COMPLETE     Status: None   Collection Time: 10/15/17  4:24 PM  Result Value Ref  Range   Weight 2,828.942720001995 oz   Height 67 in   BP 121/89 mmHg  Glucose, capillary     Status: Abnormal   Collection Time: 10/15/17  5:23 PM  Result Value Ref Range   Glucose-Capillary 300 (H) 65 - 99 mg/dL  Glucose, capillary     Status: Abnormal   Collection Time: 10/15/17  6:22 PM  Result Value Ref Range   Glucose-Capillary 213 (H) 65 - 99 mg/dL  Glucose, capillary     Status: Abnormal   Collection Time: 10/15/17  7:24 PM  Result Value Ref Range   Glucose-Capillary 193 (H) 65 - 99 mg/dL   Comment 1 Notify RN   Glucose, capillary     Status: Abnormal   Collection Time: 10/15/17  8:17 PM  Result Value Ref Range   Glucose-Capillary 160 (H) 65 - 99 mg/dL   Comment 1 Notify RN   Glucose, capillary     Status: Abnormal    Collection Time: 10/15/17  9:11 PM  Result Value Ref Range   Glucose-Capillary 153 (H) 65 - 99 mg/dL  Glucose, capillary     Status: Abnormal   Collection Time: 10/15/17 10:09 PM  Result Value Ref Range   Glucose-Capillary 137 (H) 65 - 99 mg/dL  Heparin level (unfractionated)     Status: None   Collection Time: 10/15/17 10:39 PM  Result Value Ref Range   Heparin Unfractionated 0.56 0.30 - 0.70 IU/mL    Comment: (NOTE) If heparin results are below expected values, and patient dosage has  been confirmed, suggest follow up testing of antithrombin III levels. Performed at [facility], [address] [place], [place] [address]   APTT     Status: Abnormal   Collection Time: 10/15/17 10:39 PM  Result Value Ref Range   aPTT 48 (H) 24 - 36 seconds    Comment:        IF BASELINE aPTT IS ELEVATED, SUGGEST PATIENT RISK ASSESSMENT BE USED TO DETERMINE APPROPRIATE ANTICOAGULANT THERAPY. Performed at [facility], [address] [place], [place] [address]   Glucose, capillary     Status: Abnormal   Collection Time: 10/15/17 11:10 PM  Result Value Ref Range   Glucose-Capillary 127 (H) 65 - 99 mg/dL   Comment 1 Notify RN   Glucose, capillary     Status: Abnormal   Collection Time: 10/16/17 12:13 AM  Result Value Ref Range   Glucose-Capillary 134 (H) 65 - 99 mg/dL   Comment 1 Notify RN   Glucose, capillary     Status: Abnormal   Collection Time: 10/16/17  1:19 AM  Result Value Ref Range   Glucose-Capillary 139 (H) 65 - 99 mg/dL  Glucose, capillary     Status: Abnormal   Collection Time: 10/16/17  2:25 AM  Result Value Ref Range   Glucose-Capillary 187 (H) 65 - 99 mg/dL  Glucose, capillary     Status: Abnormal   Collection Time: 10/16/17  3:37 AM  Result Value Ref Range   Glucose-Capillary 197 (H) 65 - 99 mg/dL   Comment 1 Notify RN   Glucose, capillary     Status: Abnormal   Collection Time: 10/16/17  4:45 AM  Result Value Ref Range   Glucose-Capillary 229  (H) 65 - 99 mg/dL  Glucose, capillary     Status: Abnormal   Collection Time: 10/16/17  5:15 AM  Result Value Ref Range   Glucose-Capillary 222 (H) 65 - 99 mg/dL  Basic metabolic panel  Status: Abnormal   Collection Time: 10/16/17  5:23 AM  Result Value Ref Range   Sodium 138 135 - 145 mmol/L   Potassium 4.3 3.5 - 5.1 mmol/L   Chloride 106 101 - 111 mmol/L   CO2 23 22 - 32 mmol/L   Glucose, Bld 206 (H) 65 - 99 mg/dL   BUN 34 (H) 6 - 20 mg/dL   Creatinine, Ser 1.37 (H) 0.61 - 1.24 mg/dL   Calcium 8.4 (L) 8.9 - 10.3 mg/dL   GFR calc non Af Amer 47 (L) >60 mL/min   GFR calc Af Amer 55 (L) >60 mL/min    Comment: (NOTE) The eGFR has been calculated using the CKD EPI equation. This calculation has not been validated in all clinical situations. eGFR's persistently <60 mL/min signify possible Chronic Kidney Disease.    Anion gap 9 5 - 15    Comment: Performed at [facility], [address] [place], [place] [address]  Brain natriuretic peptide     Status: Abnormal   Collection Time: 10/16/17  5:23 AM  Result Value Ref Range   B Natriuretic Peptide 3,185.0 (H) 0.0 - 100.0 pg/mL    Comment: Performed at [facility], [address] [place], [place] [address]  CBC with Differential/Platelet     Status: Abnormal   Collection Time: 10/16/17  5:23 AM  Result Value Ref Range   WBC 13.5 (H) 3.8 - 10.6 K/uL   RBC 5.34 4.40 - 5.90 MIL/uL   Hemoglobin 14.5 13.0 - 18.0 g/dL   HCT 44.8 40.0 - 52.0 %   MCV 84.0 80.0 - 100.0 fL   MCH 27.1 26.0 - 34.0 pg   MCHC 32.3 32.0 - 36.0 g/dL   RDW 18.2 (H) 11.5 - 14.5 %   Platelets 181 150 - 440 K/uL   Neutrophils Relative % 86 %   Neutro Abs 11.5 (H) 1.4 - 6.5 K/uL   Lymphocytes Relative 9 %   Lymphs Abs 1.2 1.0 - 3.6 K/uL   Monocytes Relative 5 %   Monocytes Absolute 0.7 0.2 - 1.0 K/uL   Eosinophils Relative 1 %   Eosinophils Absolute 0.1 0 - 0.7 K/uL   Basophils Relative 1 %   Basophils Absolute 0.1 0 - 0.1 K/uL     Comment: Performed at [facility], [address] [place], [place] [address]  Digoxin level     Status: None   Collection Time: 10/16/17  5:23 AM  Result Value Ref Range   Digoxin Level 1.1 0.8 - 2.0 ng/mL    Comment: Performed at [facility], [address] [place], [place] [address]  Troponin I     Status: Abnormal   Collection Time: 10/16/17  5:23 AM  Result Value Ref Range   Troponin I 10.68 (HH) <0.03 ng/mL    Comment: CRITICAL VALUE NOTED. VALUE IS CONSISTENT WITH PREVIOUSLY REPORTED/CALLED VALUE. QSD Performed at [facility], [address] [place],  [address]   Glucose, capillary     Status: Abnormal   Collection Time: 10/16/17  6:46 AM  Result Value Ref Range   Glucose-Capillary 158 (H) 65 - 99 mg/dL  Glucose, capillary     Status: None   Collection Time: 10/16/17  7:49 AM  Result Value Ref Range   Glucose-Capillary 97 65 - 99 mg/dL  Heparin level (unfractionated)     Status: Abnormal   Collection Time: 10/16/17  8:06 AM  Result Value Ref Range   Heparin Unfractionated 0.19 (  L) 0.30 - 0.70 IU/mL    Comment: (NOTE) If heparin results are below expected values, and patient dosage has  been confirmed, suggest follow up testing of antithrombin III levels. Performed at [facility], [address] [place], [place] [address]   Glucose, capillary     Status: Abnormal   Collection Time: 10/16/17  8:52 AM  Result Value Ref Range   Glucose-Capillary 127 (H) 65 - 99 mg/dL  Lactic acid, plasma     Status: Abnormal   Collection Time: 10/16/17  9:07 AM  Result Value Ref Range   Lactic Acid, Venous 2.7 (HH) 0.5 - 1.9 mmol/L    Comment: CRITICAL RESULT CALLED TO, READ BACK BY AND VERIFIED WITH SABRINA ELLIOT ON 10/16/17 AT 9485 QSD Performed at [facility], [address] [place], [place] [address]   Glucose, capillary     Status: Abnormal   Collection Time: 10/16/17  9:54 AM  Result Value Ref Range    Glucose-Capillary 103 (H) 65 - 99 mg/dL  Glucose, capillary     Status: Abnormal   Collection Time: 10/16/17 10:58 AM  Result Value Ref Range   Glucose-Capillary 113 (H) 65 - 99 mg/dL  Glucose, capillary     Status: Abnormal   Collection Time: 10/16/17 11:49 AM  Result Value Ref Range   Glucose-Capillary 135 (H) 65 - 99 mg/dL  Glucose, capillary     Status: Abnormal   Collection Time: 10/16/17  1:01 PM  Result Value Ref Range   Glucose-Capillary 165 (H) 65 - 99 mg/dL  Glucose, capillary     Status: Abnormal   Collection Time: 10/16/17  2:05 PM  Result Value Ref Range   Glucose-Capillary 151 (H) 65 - 99 mg/dL  Troponin I     Status: Abnormal   Collection Time: 10/16/17  2:22 PM  Result Value Ref Range   Troponin I 7.08 (HH) <0.03 ng/mL    Comment: CRITICAL VALUE NOTED. VALUE IS CONSISTENT WITH PREVIOUSLY REPORTED/CALLED VALUE SNJ Performed at [name], [place], [place] [address]   Lactic acid, plasma     Status: None   Collection Time: 10/16/17  2:22 PM  Result Value Ref Range   Lactic Acid, Venous 1.4 0.5 - 1.9 mmol/L    Comment: Performed at [facility], [address] [place], [place] [address]  Lipid panel     Status: Abnormal   Collection Time: 10/16/17  2:22 PM  Result Value Ref Range   Cholesterol 108 0 - 200 mg/dL   Triglycerides 47 <150 mg/dL   HDL 37 (L) >40 mg/dL   Total CHOL/HDL Ratio 2.9 RATIO   VLDL 9 0 - 40 mg/dL   LDL Cholesterol 62 0 - 99 mg/dL    Comment:        Total Cholesterol/HDL:CHD Risk Coronary Heart Disease Risk Table                     Men   Women  1/2 Average Risk   3.4   3.3  Average Risk       5.0   4.4  2 X Average Risk   9.6   7.1  3 X Average Risk  23.4   11.0        Use the calculated Patient Ratio above and the CHD Risk Table to determine the patient's CHD Risk.        ATP III CLASSIFICATION (LDL):  <100  mg/dL   Optimal  100-129  mg/dL   Near or Above                    Optimal   130-159  mg/dL   Borderline  160-189  mg/dL   High  >190     mg/dL   Very High Performed at [facility], [address] [place], [place] [address]   Glucose, capillary     Status: Abnormal   Collection Time: 10/16/17  3:47 PM  Result Value Ref Range   Glucose-Capillary 135 (H) 65 - 99 mg/dL  Heparin level (unfractionated)     Status: Abnormal   Collection Time: 10/16/17  5:01 PM  Result Value Ref Range   Heparin Unfractionated 0.92 (H) 0.30 - 0.70 IU/mL    Comment: (NOTE) If heparin results are below expected values, and patient dosage has  been confirmed, suggest follow up testing of antithrombin III levels. Performed at [facility], [address] [place], [place] [address]   Glucose, capillary     Status: Abnormal   Collection Time: 10/16/17  7:21 PM  Result Value Ref Range   Glucose-Capillary 100 (H) 65 - 99 mg/dL   Comment 1 Notify RN   Troponin I     Status: Abnormal   Collection Time: 10/16/17  8:13 PM  Result Value Ref Range   Troponin I 7.08 (HH) <0.03 ng/mL    Comment: CRITICAL VALUE NOTED. VALUE IS CONSISTENT WITH PREVIOUSLY REPORTED/CALLED VALUE RWW Performed at [facility], [address] [place], [place] [address]   Glucose, capillary     Status: None   Collection Time: 10/16/17 10:58 PM  Result Value Ref Range   Glucose-Capillary 99 65 - 99 mg/dL  Glucose, capillary     Status: Abnormal   Collection Time: 10/16/17 11:36 PM  Result Value Ref Range   Glucose-Capillary 103 (H) 65 - 99 mg/dL  Troponin I     Status: Abnormal   Collection Time: 10/17/17  2:45 AM  Result Value Ref Range   Troponin I 5.41 (HH) <0.03 ng/mL    Comment: CRITICAL VALUE NOTED. VALUE IS CONSISTENT WITH PREVIOUSLY REPORTED/CALLED VALUE RWW Performed at [facility], [address] [place], [place] [address]   Lactic acid, plasma     Status: Abnormal   Collection Time: 10/17/17  2:45 AM  Result Value Ref Range   Lactic Acid, Venous 2.4  (HH) 0.5 - 1.9 mmol/L    Comment: CRITICAL RESULT CALLED TO, READ BACK BY AND VERIFIED WITH MELISSA COBB AT 4944 10/17/17.PMH Performed at [facility], [address] [place], [place] [address]   CBC with Differential/Platelet     Status: Abnormal   Collection Time: 10/17/17  2:45 AM  Result Value Ref Range   WBC 11.0 (H) 3.8 - 10.6 K/uL   RBC 5.73 4.40 - 5.90 MIL/uL   Hemoglobin 15.9 13.0 - 18.0 g/dL   HCT 47.6 40.0 - 52.0 %   MCV 83.0 80.0 - 100.0 fL   MCH 27.8 26.0 - 34.0 pg   MCHC 33.5 32.0 - 36.0 g/dL   RDW 18.6 (H) 11.5 - 14.5 %   Platelets 167 150 - 440 K/uL    Comment: PLATELET CLUMPS NOTED ON SMEAR, COUNT APPEARS ADEQUATE   Neutrophils Relative % 77 %   Neutro Abs 8.5 (H) 1.4 - 6.5 K/uL   Lymphocytes Relative 14 %   Lymphs Abs 1.5 1.0 - 3.6 K/uL   Monocytes  Relative 6 %   Monocytes Absolute 0.7 0.2 - 1.0 K/uL   Eosinophils Relative 2 %   Eosinophils Absolute 0.3 0 - 0.7 K/uL   Basophils Relative 1 %   Basophils Absolute 0.1 0 - 0.1 K/uL    Comment: Performed at [facility], [address] [place], [place] [address]  Renal function panel     Status: Abnormal   Collection Time: 10/17/17  2:45 AM  Result Value Ref Range   Sodium 138 135 - 145 mmol/L   Potassium 3.6 3.5 - 5.1 mmol/L   Chloride 105 101 - 111 mmol/L   CO2 21 (L) 22 - 32 mmol/L   Glucose, Bld 122 (H) 65 - 99 mg/dL   BUN 35 (H) 6 - 20 mg/dL   Creatinine, Ser 1.20 0.61 - 1.24 mg/dL   Calcium 8.4 (L) 8.9 - 10.3 mg/dL   Phosphorus 3.2 2.5 - 4.6 mg/dL   Albumin 3.1 (L) 3.5 - 5.0 g/dL   GFR calc non Af Amer 55 (L) >60 mL/min   GFR calc Af Amer >60 >60 mL/min    Comment: (NOTE) The eGFR has been calculated using the CKD EPI equation. This calculation has not been validated in all clinical situations. eGFR's persistently <60 mL/min signify possible Chronic Kidney Disease.    Anion gap 12 5 - 15    Comment: Performed at [facility], [address] [place], [place]  [address]  Magnesium     Status: None   Collection Time: 10/17/17  2:45 AM  Result Value Ref Range   Magnesium 1.9 1.7 - 2.4 mg/dL    Comment: Performed at [facility], [place], [place] [address]  Heparin level (unfractionated)     Status: None   Collection Time: 10/17/17  2:45 AM  Result Value Ref Range   Heparin Unfractionated 0.43 0.30 - 0.70 IU/mL    Comment: (NOTE) If heparin results are below expected values, and patient dosage has  been confirmed, suggest follow up testing of antithrombin III levels. Performed at [facility], [name] [place], [place] [address]   Glucose, capillary     Status: Abnormal   Collection Time: 10/17/17  3:39 AM  Result Value Ref Range   Glucose-Capillary 130 (H) 65 - 99 mg/dL   Comment 1 Notify RN   CBC     Status: Abnormal   Collection Time: 10/17/17  5:07 AM  Result Value Ref Range   WBC 9.8 3.8 - 10.6 K/uL   RBC 5.06 4.40 - 5.90 MIL/uL   Hemoglobin 14.1 13.0 - 18.0 g/dL   HCT 42.0 40.0 - 52.0 %   MCV 83.0 80.0 - 100.0 fL   MCH 27.9 26.0 - 34.0 pg   MCHC 33.7 32.0 - 36.0 g/dL   RDW 18.3 (H) 11.5 - 14.5 %   Platelets 151 150 - 440 K/uL    Comment: Performed at [facility], [address] [place], [place] [address]  Glucose, capillary     Status: None   Collection Time: 10/17/17  7:38 AM  Result Value Ref Range   Glucose-Capillary 93 65 - 99 mg/dL  Lactic acid, plasma     Status: None   Collection Time: 10/17/17  8:04 AM  Result Value Ref Range   Lactic Acid, Venous 1.8 0.5 - 1.9 mmol/L    Comment: Performed at [facility], [address] [place], [place] [address]  Heparin level (unfractionated)  Status: None   Collection Time: 10/17/17 11:07 AM  Result Value Ref Range   Heparin Unfractionated 0.41 0.30 - 0.70 IU/mL    Comment: (NOTE) If heparin results are below expected values, and patient dosage has  been confirmed, suggest follow up testing of antithrombin III  levels. Performed at [facility], [address] [place], [place] [address]   Glucose, capillary     Status: Abnormal   Collection Time: 10/17/17 11:43 AM  Result Value Ref Range   Glucose-Capillary 222 (H) 65 - 99 mg/dL  Glucose, capillary     Status: Abnormal   Collection Time: 10/17/17  4:02 PM  Result Value Ref Range   Glucose-Capillary 144 (H) 65 - 99 mg/dL  Glucose, capillary     Status: Abnormal   Collection Time: 10/17/17  7:57 PM  Result Value Ref Range   Glucose-Capillary 174 (H) 65 - 99 mg/dL  Glucose, capillary     Status: Abnormal   Collection Time: 10/17/17 11:55 PM  Result Value Ref Range   Glucose-Capillary 243 (H) 65 - 99 mg/dL  Glucose, capillary     Status: Abnormal   Collection Time: 10/18/17  3:56 AM  Result Value Ref Range   Glucose-Capillary 151 (H) 65 - 99 mg/dL  Digoxin level     Status: None   Collection Time: 10/18/17  6:32 AM  Result Value Ref Range   Digoxin Level 0.8 0.8 - 2.0 ng/mL    Comment: Performed at [facility], [address] [place], [place] [address]  CBC with Differential/Platelet     Status: Abnormal   Collection Time: 10/18/17  6:32 AM  Result Value Ref Range   WBC 9.5 3.8 - 10.6 K/uL   RBC 5.06 4.40 - 5.90 MIL/uL   Hemoglobin 14.1 13.0 - 18.0 g/dL   HCT 41.7 40.0 - 52.0 %   MCV 82.3 80.0 - 100.0 fL   MCH 27.9 26.0 - 34.0 pg   MCHC 34.0 32.0 - 36.0 g/dL   RDW 18.3 (H) 11.5 - 14.5 %   Platelets 178 150 - 440 K/uL   Neutrophils Relative % 74 %   Neutro Abs 7.0 (H) 1.4 - 6.5 K/uL   Lymphocytes Relative 15 %   Lymphs Abs 1.5 1.0 - 3.6 K/uL   Monocytes Relative 7 %   Monocytes Absolute 0.6 0.2 - 1.0 K/uL   Eosinophils Relative 3 %   Eosinophils Absolute 0.3 0 - 0.7 K/uL   Basophils Relative 1 %   Basophils Absolute 0.1 0 - 0.1 K/uL    Comment: Performed at [facility], [place], [place] [address]  Heparin level (unfractionated)     Status: None   Collection Time: 10/18/17   6:32 AM  Result Value Ref Range   Heparin Unfractionated 0.34 0.30 - 0.70 IU/mL    Comment: (NOTE) If heparin results are below expected values, and patient dosage has  been confirmed, suggest follow up testing of antithrombin III levels. Performed at [facility], [address] [place], [place] [address]   Glucose, capillary     Status: None   Collection Time: 10/18/17  7:35 AM  Result Value Ref Range   Glucose-Capillary 99 65 - 99 mg/dL  Glucose, capillary     Status: None   Collection Time: 10/18/17 10:59 AM  Result Value Ref Range   Glucose-Capillary 98 65 - 99 mg/dL  Glucose, capillary     Status: Abnormal   Collection Time: 10/18/17  4:47 PM  Result Value Ref Range   Glucose-Capillary 205 (H) 65 - 99 mg/dL  Glucose, capillary     Status: Abnormal   Collection Time: 10/18/17  7:42 PM  Result Value Ref Range   Glucose-Capillary 296 (H) 65 - 99 mg/dL   Comment 1 Notify RN   Glucose, capillary     Status: Abnormal   Collection Time: 10/18/17 10:02 PM  Result Value Ref Range   Glucose-Capillary 239 (H) 65 - 99 mg/dL   Comment 1 Notify RN    Comment 2 Document in Chart   Glucose, capillary     Status: Abnormal   Collection Time: 10/19/17  4:26 AM  Result Value Ref Range   Glucose-Capillary 259 (H) 65 - 99 mg/dL  CBC     Status: Abnormal   Collection Time: 10/19/17  6:59 AM  Result Value Ref Range   WBC 10.2 3.8 - 10.6 K/uL   RBC 4.92 4.40 - 5.90 MIL/uL   Hemoglobin 13.5 13.0 - 18.0 g/dL   HCT 40.4 40.0 - 52.0 %   MCV 82.2 80.0 - 100.0 fL   MCH 27.5 26.0 - 34.0 pg   MCHC 33.4 32.0 - 36.0 g/dL   RDW 18.2 (H) 11.5 - 14.5 %   Platelets 196 150 - 440 K/uL    Comment: Performed at [facility], [address] [place], [place] [address]  Basic metabolic panel     Status: Abnormal   Collection Time: 10/19/17  6:59 AM  Result Value Ref Range   Sodium 136 135 - 145 mmol/L   Potassium 3.3 (L) 3.5 - 5.1 mmol/L   Chloride 106 101 - 111 mmol/L   CO2 22  22 - 32 mmol/L   Glucose, Bld 222 (H) 65 - 99 mg/dL   BUN 41 (H) 6 - 20 mg/dL   Creatinine, Ser 1.25 (H) 0.61 - 1.24 mg/dL   Calcium 7.8 (L) 8.9 - 10.3 mg/dL   GFR calc non Af Amer 53 (L) >60 mL/min   GFR calc Af Amer >60 >60 mL/min    Comment: (NOTE) The eGFR has been calculated using the CKD EPI equation. This calculation has not been validated in all clinical situations. eGFR's persistently <60 mL/min signify possible Chronic Kidney Disease.    Anion gap 8 5 - 15    Comment: Performed at [facility], [address] [place], [place] [address]  Magnesium     Status: None   Collection Time: 10/19/17  6:59 AM  Result Value Ref Range   Magnesium 1.9 1.7 - 2.4 mg/dL    Comment: Performed at [facility], [address] [place], [place] [address]  Phosphorus     Status: None   Collection Time: 10/19/17  6:59 AM  Result Value Ref Range   Phosphorus 2.7 2.5 - 4.6 mg/dL    Comment: Performed at [facility], [address] [place], [place] [address]  Calcium, ionized     Status: None   Collection Time: 10/19/17  6:59 AM  Result Value Ref Range   Calcium, Ionized, Serum 4.7 4.5 - 5.6 mg/dL    Comment: (NOTE) Performed At: [facility] [place], [place] [id number] Rush Farmer MD [id number] Performed at [facility], [address] [place], [place] [address]   Brain natriuretic peptide     Status: Abnormal   Collection Time: 10/19/17  6:59 AM  Result Value Ref Range   B Natriuretic Peptide 2,483.0 (H) 0.0 -  100.0 pg/mL    Comment: Performed at [facility], [address] [place], [place] [address]  Glucose, capillary     Status: Abnormal   Collection Time: 10/19/17  7:38 AM  Result Value Ref Range   Glucose-Capillary 191 (H) 65 - 99 mg/dL  Urinalysis, Routine w reflex microscopic     Status: Abnormal   Collection Time: 10/19/17 11:57 AM  Result Value Ref Range   Color, Urine STRAW (A) YELLOW    APPearance CLEAR (A) CLEAR   Specific Gravity, Urine 1.006 1.005 - 1.030   pH 5.0 5.0 - 8.0   Glucose, UA 150 (A) NEGATIVE mg/dL   Hgb urine dipstick MODERATE (A) NEGATIVE   Bilirubin Urine NEGATIVE NEGATIVE   Ketones, ur NEGATIVE NEGATIVE mg/dL   Protein, ur NEGATIVE NEGATIVE mg/dL   Nitrite NEGATIVE NEGATIVE   Leukocytes, UA NEGATIVE NEGATIVE   RBC / HPF 11-20 0 - 5 RBC/hpf   WBC, UA 0-5 0 - 5 WBC/hpf   Bacteria, UA RARE (A) NONE SEEN   Squamous Epithelial / LPF NONE SEEN 0 - 5   Mucus PRESENT    Hyaline Casts, UA PRESENT     Comment: Performed at [facility], [address] [place], [place] [address]  Glucose, capillary     Status: Abnormal   Collection Time: 10/19/17 12:04 PM  Result Value Ref Range   Glucose-Capillary 161 (H) 65 - 99 mg/dL  Glucose, capillary     Status: Abnormal   Collection Time: 10/19/17  5:07 PM  Result Value Ref Range   Glucose-Capillary 278 (H) 65 - 99 mg/dL  Glucose, capillary     Status: Abnormal   Collection Time: 10/19/17  9:16 PM  Result Value Ref Range   Glucose-Capillary 263 (H) 65 - 99 mg/dL  Glucose, capillary     Status: Abnormal   Collection Time: 10/20/17  7:53 AM  Result Value Ref Range   Glucose-Capillary 143 (H) 65 - 99 mg/dL   Comment 1 Notify RN   Magnesium     Status: None   Collection Time: 10/20/17 11:33 AM  Result Value Ref Range   Magnesium 1.9 1.7 - 2.4 mg/dL    Comment: Performed at [facility], [address] [place], [place] [address]  Glucose, capillary     Status: Abnormal   Collection Time: 10/20/17 12:06 PM  Result Value Ref Range   Glucose-Capillary 307 (H) 65 - 99 mg/dL   Comment 1 Notify RN   Glucose, capillary     Status: Abnormal   Collection Time: 10/20/17  4:10 PM  Result Value Ref Range   Glucose-Capillary 215 (H) 65 - 99 mg/dL   Comment 1 Notify RN   Glucose, capillary     Status: Abnormal   Collection Time: 10/20/17  8:47 PM  Result Value Ref Range   Glucose-Capillary 223  (H) 65 - 99 mg/dL  Basic metabolic panel     Status: Abnormal   Collection Time: 10/21/17  6:27 AM  Result Value Ref Range   Sodium 137 135 - 145 mmol/L   Potassium 3.8 3.5 - 5.1 mmol/L   Chloride 102 101 - 111 mmol/L   CO2 26 22 - 32 mmol/L   Glucose, Bld 145 (H) 65 - 99 mg/dL   BUN 26 (H) 6 - 20 mg/dL   Creatinine, Ser 0.92 0.61 - 1.24 mg/dL   Calcium 8.2 (L) 8.9 - 10.3 mg/dL   GFR calc non Af Amer >60 >60 mL/min  GFR calc Af Amer >60 >60 mL/min    Comment: (NOTE) The eGFR has been calculated using the CKD EPI equation. This calculation has not been validated in all clinical situations. eGFR's persistently <60 mL/min signify possible Chronic Kidney Disease.    Anion gap 9 5 - 15    Comment: Performed at [facility], [address] [place], [place] [address]  Glucose, capillary     Status: Abnormal   Collection Time: 10/21/17  7:49 AM  Result Value Ref Range   Glucose-Capillary 123 (H) 65 - 99 mg/dL  Glucose, capillary     Status: Abnormal   Collection Time: 10/21/17 11:34 AM  Result Value Ref Range   Glucose-Capillary 157 (H) 65 - 99 mg/dL  Glucose, capillary     Status: None   Collection Time: 10/21/17  2:14 PM  Result Value Ref Range   Glucose-Capillary 91 65 - 99 mg/dL  Glucose, capillary     Status: Abnormal   Collection Time: 10/21/17  5:12 PM  Result Value Ref Range   Glucose-Capillary 140 (H) 65 - 99 mg/dL  Glucose, capillary     Status: Abnormal   Collection Time: 10/21/17  9:45 PM  Result Value Ref Range   Glucose-Capillary 211 (H) 65 - 99 mg/dL  Glucose, capillary     Status: Abnormal   Collection Time: 10/22/17 12:59 AM  Result Value Ref Range   Glucose-Capillary 219 (H) 65 - 99 mg/dL  Glucose, capillary     Status: Abnormal   Collection Time: 10/22/17  8:05 AM  Result Value Ref Range   Glucose-Capillary 207 (H) 65 - 99 mg/dL   Comment 1 Notify RN   Glucose, capillary     Status: Abnormal   Collection Time: 10/22/17 12:18 PM  Result Value  Ref Range   Glucose-Capillary 437 (H) 65 - 99 mg/dL   Comment 1 Notify RN   Glucose, capillary     Status: Abnormal   Collection Time: 10/22/17  5:05 PM  Result Value Ref Range   Glucose-Capillary 149 (H) 65 - 99 mg/dL   Comment 1 Notify RN   Ammonia     Status: Abnormal   Collection Time: 10/22/17  5:19 PM  Result Value Ref Range   Ammonia <9 (L) 9 - 35 umol/L    Comment: Performed at [facility], [address] [place], [place] [address]  Urinalysis, Complete w Microscopic     Status: Abnormal   Collection Time: 10/22/17  5:26 PM  Result Value Ref Range   Color, Urine YELLOW (A) YELLOW   APPearance HAZY (A) CLEAR   Specific Gravity, Urine 1.011 1.005 - 1.030   pH 5.0 5.0 - 8.0   Glucose, UA >=500 (A) NEGATIVE mg/dL   Hgb urine dipstick LARGE (A) NEGATIVE   Bilirubin Urine NEGATIVE NEGATIVE   Ketones, ur NEGATIVE NEGATIVE mg/dL   Protein, ur NEGATIVE NEGATIVE mg/dL   Nitrite NEGATIVE NEGATIVE   Leukocytes, UA NEGATIVE NEGATIVE   RBC / HPF >50 (H) 0 - 5 RBC/hpf   WBC, UA 6-10 0 - 5 WBC/hpf   Bacteria, UA RARE (A) NONE SEEN   Squamous Epithelial / LPF 0-5 0 - 5   Mucus PRESENT    Hyaline Casts, UA PRESENT     Comment: Performed at [facility], [address] [place], [place] [address]  Glucose, capillary     Status: None   Collection Time: 10/22/17  8:22 PM  Result Value Ref Range   Glucose-Capillary  85 65 - 99 mg/dL  Glucose, capillary     Status: Abnormal   Collection Time: 10/23/17  2:12 AM  Result Value Ref Range   Glucose-Capillary 218 (H) 65 - 99 mg/dL   Comment 1 Notify RN    Comment 2 Document in Chart   Blood gas, arterial     Status: Abnormal   Collection Time: 10/23/17  2:24 AM  Result Value Ref Range   FIO2 0.21    pH, Arterial 7.44 7.350 - 7.450   pCO2 arterial 37 32.0 - 48.0 mmHg   pO2, Arterial 41 (L) 83.0 - 108.0 mmHg   Bicarbonate 25.1 20.0 - 28.0 mmol/L   Acid-Base Excess 1.2 0.0 - 2.0 mmol/L   O2 Saturation 78.5 %    Patient temperature 37.0    Collection site LEFT RADIAL    Sample type VENOUS    Allens test (pass/fail) PASS PASS    Comment: Performed at [facility], [address] [place], [place] [address]  CBC     Status: Abnormal   Collection Time: 10/23/17  6:40 AM  Result Value Ref Range   WBC 14.2 (H) 3.8 - 10.6 K/uL   RBC 5.32 4.40 - 5.90 MIL/uL   Hemoglobin 14.7 13.0 - 18.0 g/dL   HCT 44.1 40.0 - 52.0 %   MCV 82.8 80.0 - 100.0 fL   MCH 27.7 26.0 - 34.0 pg   MCHC 33.4 32.0 - 36.0 g/dL   RDW 18.7 (H) 11.5 - 14.5 %   Platelets 235 150 - 440 K/uL    Comment: Performed at [facility], [address] [place], [name] [address]  Comprehensive metabolic panel     Status: Abnormal   Collection Time: 10/23/17  6:40 AM  Result Value Ref Range   Sodium 138 135 - 145 mmol/L   Potassium 3.9 3.5 - 5.1 mmol/L   Chloride 102 101 - 111 mmol/L   CO2 26 22 - 32 mmol/L   Glucose, Bld 215 (H) 65 - 99 mg/dL   BUN 33 (H) 6 - 20 mg/dL   Creatinine, Ser 1.10 0.61 - 1.24 mg/dL   Calcium 8.3 (L) 8.9 - 10.3 mg/dL   Total Protein 6.1 (L) 6.5 - 8.1 g/dL   Albumin 2.8 (L) 3.5 - 5.0 g/dL   AST 30 15 - 41 U/L   ALT 35 17 - 63 U/L   Alkaline Phosphatase 175 (H) 38 - 126 U/L   Total Bilirubin 1.1 0.3 - 1.2 mg/dL   GFR calc non Af Amer >60 >60 mL/min   GFR calc Af Amer >60 >60 mL/min    Comment: (NOTE) The eGFR has been calculated using the CKD EPI equation. This calculation has not been validated in all clinical situations. eGFR's persistently <60 mL/min signify possible Chronic Kidney Disease.    Anion gap 10 5 - 15    Comment: Performed at [facility], [address] [place], [place] [address]  Glucose, capillary     Status: Abnormal   Collection Time: 10/23/17  7:12 AM  Result Value Ref Range   Glucose-Capillary 201 (H) 65 - 99 mg/dL  Blood gas, arterial     Status: Abnormal   Collection Time: 10/23/17  9:23 AM  Result Value Ref Range   FIO2 0.21    pH, Arterial  7.40 7.350 - 7.450   pCO2 arterial 47 32.0 - 48.0 mmHg   pO2, Arterial 56 (L) 83.0 - 108.0 mmHg   Bicarbonate 29.1 (H) 20.0 -  28.0 mmol/L   Acid-Base Excess 3.4 (H) 0.0 - 2.0 mmol/L   O2 Saturation 88.7 %   Patient temperature 37.0    Collection site LEFT BRACHIAL    Sample type ARTERIAL DRAW    Allens test (pass/fail) NOT APPLICABLE (A) PASS    Comment: Performed at [facility], [address] [place], [place] [address]  Glucose, capillary     Status: Abnormal   Collection Time: 10/23/17 11:42 AM  Result Value Ref Range   Glucose-Capillary 149 (H) 65 - 99 mg/dL  Lamotrigine level     Status: None   Collection Time: 10/23/17 12:14 PM  Result Value Ref Range   Lamotrigine Lvl 3.9 2.0 - 20.0 ug/mL    Comment: (NOTE) This test was developed and its performance characteristics determined by [facility]. It has not been cleared or approved by the Food and Drug Administration.                                Detection Limit = 1.0 Performed At: [facility] [place], [place] [id number] Rush Farmer MD [id number] Performed at [facility], [address] [place], [place] [address]   Urinalysis, Complete w Microscopic     Status: Abnormal   Collection Time: 10/23/17  1:15 PM  Result Value Ref Range   Color, Urine AMBER (A) YELLOW    Comment: BIOCHEMICALS MAY BE AFFECTED BY COLOR   APPearance CLOUDY (A) CLEAR   Specific Gravity, Urine 1.023 1.005 - 1.030   pH 5.0 5.0 - 8.0   Glucose, UA 50 (A) NEGATIVE mg/dL   Hgb urine dipstick LARGE (A) NEGATIVE   Bilirubin Urine NEGATIVE NEGATIVE   Ketones, ur NEGATIVE NEGATIVE mg/dL   Protein, ur 30 (A) NEGATIVE mg/dL   Nitrite NEGATIVE NEGATIVE   Leukocytes, UA NEGATIVE NEGATIVE   RBC / HPF >50 (H) 0 - 5 RBC/hpf   WBC, UA 11-20 0 - 5 WBC/hpf   Bacteria, UA RARE (A) NONE SEEN   Squamous Epithelial / LPF NONE SEEN 0 - 5   Mucus PRESENT     Comment: Performed at [facility], [address] [place], [place] [address]  Culture, blood (single) w Reflex to ID Panel     Status: None   Collection Time: 10/23/17  3:51 PM  Result Value Ref Range   Specimen Description BLOOD LAC    Special Requests      BOTTLES DRAWN AEROBIC AND ANAEROBIC Blood Culture adequate volume   Culture      NO GROWTH 5 DAYS Performed at [facility], [address] [place], [place] [address]    Report Status 10/28/2017 FINAL   Glucose, capillary     Status: Abnormal   Collection Time: 10/23/17  4:08 PM  Result Value Ref Range   Glucose-Capillary 136 (H) 65 - 99 mg/dL  Glucose, capillary     Status: Abnormal   Collection Time: 10/23/17  9:24 PM  Result Value Ref Range   Glucose-Capillary 121 (H) 65 - 99 mg/dL  CBC     Status: Abnormal   Collection Time: 10/24/17  4:54 AM  Result Value Ref Range   WBC 10.8 (H) 3.8 - 10.6 K/uL   RBC 5.58 4.40 - 5.90 MIL/uL   Hemoglobin 15.7 13.0 - 18.0 g/dL   HCT 46.3 40.0 - 52.0 %   MCV 83.0 80.0 - 100.0 fL   MCH 28.1  26.0 - 34.0 pg   MCHC 33.9 32.0 - 36.0 g/dL   RDW 18.5 (H) 11.5 - 14.5 %   Platelets 241 150 - 440 K/uL    Comment: Performed at [facility], [address] [place], [place] [address]  Comprehensive metabolic panel     Status: Abnormal   Collection Time: 10/24/17  4:54 AM  Result Value Ref Range   Sodium 139 135 - 145 mmol/L   Potassium 3.7 3.5 - 5.1 mmol/L   Chloride 106 101 - 111 mmol/L   CO2 23 22 - 32 mmol/L   Glucose, Bld 94 65 - 99 mg/dL   BUN 29 (H) 6 - 20 mg/dL   Creatinine, Ser 0.93 0.61 - 1.24 mg/dL   Calcium 8.1 (L) 8.9 - 10.3 mg/dL   Total Protein 6.1 (L) 6.5 - 8.1 g/dL   Albumin 2.8 (L) 3.5 - 5.0 g/dL   AST 32 15 - 41 U/L   ALT 34 17 - 63 U/L   Alkaline Phosphatase 167 (H) 38 - 126 U/L   Total Bilirubin 1.2 0.3 - 1.2 mg/dL   GFR calc non Af Amer >60 >60 mL/min   GFR calc Af Amer >60 >60 mL/min    Comment: (NOTE) The eGFR has been calculated using the CKD EPI equation. This calculation has not been  validated in all clinical situations. eGFR's persistently <60 mL/min signify possible Chronic Kidney Disease.    Anion gap 10 5 - 15    Comment: Performed at [facility], [address] [place], [place] [address]  Glucose, capillary     Status: None   Collection Time: 10/24/17  8:52 AM  Result Value Ref Range   Glucose-Capillary 91 65 - 99 mg/dL  Glucose, capillary     Status: Abnormal   Collection Time: 10/24/17 11:57 AM  Result Value Ref Range   Glucose-Capillary 418 (H) 65 - 99 mg/dL  Glucose, capillary     Status: Abnormal   Collection Time: 10/24/17  2:27 PM  Result Value Ref Range   Glucose-Capillary 311 (H) 65 - 99 mg/dL  Glucose, capillary     Status: Abnormal   Collection Time: 10/24/17  5:12 PM  Result Value Ref Range   Glucose-Capillary 101 (H) 65 - 99 mg/dL   Comment 1 Notify RN    Comment 2 Document in Chart   Glucose, capillary     Status: Abnormal   Collection Time: 10/24/17  8:20 PM  Result Value Ref Range   Glucose-Capillary 110 (H) 65 - 99 mg/dL  Hemoglobin A1c     Status: Abnormal   Collection Time: 10/25/17  6:12 AM  Result Value Ref Range   Hgb A1c MFr Bld 11.9 (H) 4.8 - 5.6 %    Comment: (NOTE) Pre diabetes:          5.7%-6.4% Diabetes:              >6.4% Glycemic control for   <7.0% adults with diabetes    Mean Plasma Glucose 294.83 mg/dL    Comment: Performed at [facility], [address], [place], [place] [address]  Lipid panel     Status: Abnormal   Collection Time: 10/25/17  6:12 AM  Result Value Ref Range   Cholesterol 122 0 - 200 mg/dL   Triglycerides 65 <150 mg/dL   HDL 39 (L) >40 mg/dL   Total CHOL/HDL Ratio 3.1 RATIO   VLDL 13 0 - 40 mg/dL   LDL Cholesterol 70  0 - 99 mg/dL    Comment:        Total Cholesterol/HDL:CHD Risk Coronary Heart Disease Risk Table                     Men   Women  1/2 Average Risk   3.4   3.3  Average Risk       5.0   4.4  2 X Average Risk   9.6   7.1  3 X Average Risk  23.4   11.0         Use the calculated Patient Ratio above and the CHD Risk Table to determine the patient's CHD Risk.        ATP III CLASSIFICATION (LDL):  <100     mg/dL   Optimal  100-129  mg/dL   Near or Above                    Optimal  130-159  mg/dL   Borderline  160-189  mg/dL   High  >190     mg/dL   Very High Performed at [facility], [address] [place], [place] [address]   Glucose, capillary     Status: Abnormal   Collection Time: 10/25/17  9:50 AM  Result Value Ref Range   Glucose-Capillary 166 (H) 65 - 99 mg/dL  Glucose, capillary     Status: Abnormal   Collection Time: 10/25/17 11:49 AM  Result Value Ref Range   Glucose-Capillary 127 (H) 65 - 99 mg/dL  Glucose, capillary     Status: Abnormal   Collection Time: 10/25/17  4:52 PM  Result Value Ref Range   Glucose-Capillary 100 (H) 65 - 99 mg/dL  Glucose, capillary     Status: Abnormal   Collection Time: 10/25/17  9:19 PM  Result Value Ref Range   Glucose-Capillary 226 (H) 65 - 99 mg/dL  Basic metabolic panel     Status: Abnormal   Collection Time: 10/26/17  2:59 AM  Result Value Ref Range   Sodium 136 135 - 145 mmol/L   Potassium 4.0 3.5 - 5.1 mmol/L   Chloride 102 101 - 111 mmol/L   CO2 25 22 - 32 mmol/L   Glucose, Bld 117 (H) 65 - 99 mg/dL   BUN 22 (H) 6 - 20 mg/dL   Creatinine, Ser 1.09 0.61 - 1.24 mg/dL   Calcium 8.4 (L) 8.9 - 10.3 mg/dL   GFR calc non Af Amer >60 >60 mL/min   GFR calc Af Amer >60 >60 mL/min    Comment: (NOTE) The eGFR has been calculated using the CKD EPI equation. This calculation has not been validated in all clinical situations. eGFR's persistently <60 mL/min signify possible Chronic Kidney Disease.    Anion gap 9 5 - 15    Comment: Performed at [facility], [address] [place], [place] [address]  CBC     Status: Abnormal   Collection Time: 10/26/17  2:59 AM  Result Value Ref Range   WBC 9.9 3.8 - 10.6 K/uL   RBC 5.63 4.40 - 5.90 MIL/uL   Hemoglobin 16.0 13.0 -  18.0 g/dL   HCT 46.6 40.0 - 52.0 %   MCV 82.9 80.0 - 100.0 fL   MCH 28.5 26.0 - 34.0 pg   MCHC 34.3 32.0 - 36.0 g/dL   RDW 18.2 (H) 11.5 - 14.5 %   Platelets 265 150 - 440 K/uL    Comment: Performed at [facility], [address] [place], [place] [address]  Glucose, capillary     Status: Abnormal   Collection Time: 10/26/17  7:41 AM  Result Value Ref Range   Glucose-Capillary 109 (H) 65 - 99 mg/dL  Glucose, capillary     Status: None   Collection Time: 10/26/17 11:31 AM  Result Value Ref Range   Glucose-Capillary 87 65 - 99 mg/dL  Glucose, capillary     Status: None   Collection Time: 10/26/17  5:26 PM  Result Value Ref Range   Glucose-Capillary 94 65 - 99 mg/dL  Glucose, capillary     Status: None   Collection Time: 10/26/17  9:00 PM  Result Value Ref Range   Glucose-Capillary 99 65 - 99 mg/dL  Glucose, capillary     Status: None   Collection Time: 10/26/17 11:03 PM  Result Value Ref Range   Glucose-Capillary 80 65 - 99 mg/dL  CBC     Status: Abnormal   Collection Time: 10/27/17  7:45 AM  Result Value Ref Range   WBC 12.6 (H) 3.8 - 10.6 K/uL   RBC 5.97 (H) 4.40 - 5.90 MIL/uL   Hemoglobin 16.7 13.0 - 18.0 g/dL   HCT 49.9 40.0 - 52.0 %   MCV 83.7 80.0 - 100.0 fL   MCH 28.0 26.0 - 34.0 pg   MCHC 33.5 32.0 - 36.0 g/dL   RDW 18.4 (H) 11.5 - 14.5 %   Platelets 266 150 - 440 K/uL    Comment: Performed at [facility], [address] [place], [place] [address]  Basic metabolic panel     Status: Abnormal   Collection Time: 10/27/17  7:45 AM  Result Value Ref Range   Sodium 134 (L) 135 - 145 mmol/L   Potassium 4.6 3.5 - 5.1 mmol/L   Chloride 104 101 - 111 mmol/L   CO2 20 (L) 22 - 32 mmol/L   Glucose, Bld 90 65 - 99 mg/dL   BUN 27 (H) 6 - 20 mg/dL   Creatinine, Ser 0.91 0.61 - 1.24 mg/dL   Calcium 8.2 (L) 8.9 - 10.3 mg/dL   GFR calc non Af Amer >60 >60 mL/min   GFR calc Af Amer >60 >60 mL/min    Comment: (NOTE) The eGFR has been calculated using the  CKD EPI equation. This calculation has not been validated in all clinical situations. eGFR's persistently <60 mL/min signify possible Chronic Kidney Disease.    Anion gap 10 5 - 15    Comment: Performed at [facility], [address] [place],  [address]  Glucose, capillary     Status: None   Collection Time: 10/27/17  7:52 AM  Result Value Ref Range   Glucose-Capillary 85 65 - 99 mg/dL  Glucose, capillary     Status: Abnormal   Collection Time: 10/27/17 12:02 PM  Result Value Ref Range   Glucose-Capillary 184 (H) 65 - 99 mg/dL  Glucose, capillary     Status: Abnormal   Collection Time: 10/27/17  4:59 PM  Result Value Ref Range   Glucose-Capillary 187 (H) 65 - 99 mg/dL  Glucose, capillary     Status: Abnormal   Collection Time: 10/27/17  7:56 PM  Result Value Ref Range   Glucose-Capillary 248 (H) 65 - 99 mg/dL   Comment 1 Notify RN    Comment 2 Document in Chart   Glucose, capillary     Status: Abnormal   Collection Time: 10/27/17 10:26 PM  Result Value Ref Range  Glucose-Capillary 194 (H) 65 - 99 mg/dL   Comment 1 Notify RN    Comment 2 Document in Chart   Glucose, capillary     Status: Abnormal   Collection Time: 10/28/17  8:00 AM  Result Value Ref Range   Glucose-Capillary 147 (H) 65 - 99 mg/dL  Glucose, capillary     Status: Abnormal   Collection Time: 10/28/17 11:49 AM  Result Value Ref Range   Glucose-Capillary 235 (H) 65 - 99 mg/dL    Radiology Dg Chest 1 View  Result Date: 10/23/2017 CLINICAL DATA:  Elevated white blood cell count. The patient is unresponsive. EXAM: CHEST  1 VIEW COMPARISON:  Single-view of the chest 10/19/2017 and 10/18/2016. FINDINGS: There is cardiomegaly. Aeration is improved over the past 2 days. No consolidative process or pneumothorax. Likely trace right pleural effusion. IMPRESSION: Improving aeration over the past 2 days.  No new abnormality. Cardiomegaly. Electronically Signed   By: Inge Rise M.D.   On: 10/23/2017  13:44   Ct Head Wo Contrast  Result Date: 10/23/2017 CLINICAL DATA:  Mental status changes with combative behavior. EXAM: CT HEAD WITHOUT CONTRAST TECHNIQUE: Contiguous axial images were obtained from the base of the skull through the vertex without intravenous contrast. COMPARISON:  10/21/2016. FINDINGS: Brain: Generalized atrophy. Chronic small-vessel ischemic changes of the white matter. No sign of acute infarction, mass lesion, hemorrhage, hydrocephalus or extra-axial collection. Vascular: There is atherosclerotic calcification of the major vessels at the base of the brain. Skull: Negative Sinuses/Orbits: Clear/normal Other: None IMPRESSION: No acute or reversible finding. Atrophy and chronic small-vessel ischemic changes as seen previously. Electronically Signed   By: Nelson Chimes M.D.   On: 10/23/2017 10:02   Mr Jeri Cos WJ Contrast  Result Date: 10/23/2017 CLINICAL DATA:  In cephalopathy. EXAM: MRI HEAD WITHOUT AND WITH CONTRAST TECHNIQUE: Multiplanar, multiecho pulse sequences of the brain and surrounding structures were obtained without and with intravenous contrast. CONTRAST:  30m MULTIHANCE GADOBENATE DIMEGLUMINE 529 MG/ML IV SOLN COMPARISON:  Head CT 10/23/2017 and MRI 10/22/2016 FINDINGS: Brain: There is a 7 mm focus of hyperintense trace diffusion signal in the right cerebellar hemisphere without significantly reduced ADC and with increased T2/FLAIR signal and enhancement most compatible with a subacute infarct. No restricted diffusion is seen elsewhere to indicate a more acute infarct. A tiny chronic infarct in the left cerebellum is unchanged from the prior MRI. Periventricular white matter T2 hyperintensities are nonspecific but compatible with mild chronic small vessel ischemic disease, less prominent than on the prior MRI though this may be at least partly due to differences in technique. No abnormal supratentorial enhancement is identified. There is moderate cerebral atrophy. No mass,  midline shift, intracranial hemorrhage, or extra-axial fluid collection is seen. Vascular: Major intracranial vascular flow voids are preserved. Skull and upper cervical spine: Unremarkable bone marrow signal. Sinuses/Orbits: Bilateral cataract extraction. Paranasal sinuses and mastoid air cells are clear. Other: None. IMPRESSION: 1. 7 mm subacute right cerebellar infarct. 2. No acute infarct or other acute intracranial abnormality. 3. Mild chronic small vessel ischemic disease and moderate cerebral atrophy. Electronically Signed   By: ALogan BoresM.D.   On: 10/23/2017 14:54   [place]otid Bilateral  Result Date: 10/25/2017 CLINICAL DATA:  Stroke EXAM: BILATERAL CAROTID DUPLEX ULTRASOUND TECHNIQUE: GPearline Cablesscale imaging, color Doppler and duplex ultrasound were performed of bilateral carotid and vertebral arteries in the neck. COMPARISON:  None. FINDINGS: Criteria: Quantification of carotid stenosis is based on velocity parameters that correlate the residual internal carotid  diameter with NASCET-based stenosis levels, using the diameter of the distal internal carotid lumen as the denominator for stenosis measurement. The following velocity measurements were obtained: RIGHT ICA:  54 cm/sec CCA:  84 cm/sec SYSTOLIC ICA/CCA RATIO:  0.6 DIASTOLIC ICA/CCA RATIO: ECA:  76 cm/sec LEFT ICA:  60 cm/sec CCA:  70 cm/sec SYSTOLIC ICA/CCA RATIO:  0.9 DIASTOLIC ICA/CCA RATIO: ECA:  81 cm/sec RIGHT CAROTID ARTERY: Mild mixed plaque in the bulb. Low resistance internal carotid Doppler pattern is preserved. There is scattered calcified plaque in the lower and mid internal carotid artery. RIGHT VERTEBRAL ARTERY:  Antegrade. LEFT CAROTID ARTERY: Mild focal smooth mixed plaque in the bulb. Low resistance internal carotid Doppler pattern is preserved. There is calcified plaque extending from the bulb into the lower internal carotid artery. LEFT VERTEBRAL ARTERY:  Antegrade. IMPRESSION: Less than 50% stenosis in the right and left  internal carotid arteries. Electronically Signed   By: Marybelle Killings M.D.   On: 10/25/2017 09:04    Assessment/Plan Hyperlipidemia lipid control important in reducing the progression of atherosclerotic disease. Continue statin therapy   Type 2 diabetes mellitus treated with insulin (HCC) blood glucose control important in reducing the progression of atherosclerotic disease. Also, involved in wound healing. On appropriate medications.   Essential hypertension blood pressure control important in reducing the progression of atherosclerotic disease. On appropriate oral medications.  A-fib (HCC) On anticoagulation. Rapid A. fib lead to a prolonged hospitalization last year.   Atherosclerosis of native arteries of the extremities with ulceration ([place]) Originally planned a right leg angiogram if you would ago but a stroke and mental status changes cause this to be delayed.  Patient is near his baseline.  The family is hesitant to proceed with any angiography or other procedures until he is assessed by primary care provider which is very reasonable.  Since his regular primary care provider has left, we are going to try to get him hooked up with another primary care provider within the same practice.  Once he has had this assessment, his family is going to call if they would like to schedule an angiogram.  He does have a critical and limb threatening situation.    Leotis Pain, MD  11/18/2017 3:22 PM    This note was created with Dragon medical transcription system.  Any errors from dictation are purely unintentional

## 2017-11-18 NOTE — Patient Instructions (Signed)

## 2017-11-22 ENCOUNTER — Ambulatory Visit: Payer: Medicare Other | Admitting: Family

## 2017-11-23 ENCOUNTER — Emergency Department: Payer: Medicare Other

## 2017-11-23 ENCOUNTER — Inpatient Hospital Stay
Admission: EM | Admit: 2017-11-23 | Discharge: 2017-11-28 | DRG: 291 | Disposition: A | Discharge status: Skilled Nursing Facility | Payer: Medicare Other | Attending: Internal Medicine | Admitting: Internal Medicine

## 2017-11-23 ENCOUNTER — Encounter: Payer: Self-pay | Admitting: Emergency Medicine

## 2017-11-23 ENCOUNTER — Other Ambulatory Visit: Payer: Self-pay

## 2017-11-23 DIAGNOSIS — G9340 Encephalopathy, unspecified: Secondary | ICD-10-CM | POA: Diagnosis present

## 2017-11-23 DIAGNOSIS — Y92230 Patient room in hospital as the place of occurrence of the external cause: Secondary | ICD-10-CM | POA: Diagnosis not present

## 2017-11-23 DIAGNOSIS — Z7901 Long term (current) use of anticoagulants: Secondary | ICD-10-CM

## 2017-11-23 DIAGNOSIS — Y95 Nosocomial condition: Secondary | ICD-10-CM | POA: Diagnosis present

## 2017-11-23 DIAGNOSIS — I482 Chronic atrial fibrillation: Secondary | ICD-10-CM | POA: Diagnosis present

## 2017-11-23 DIAGNOSIS — I5023 Acute on chronic systolic (congestive) heart failure: Secondary | ICD-10-CM | POA: Diagnosis present

## 2017-11-23 DIAGNOSIS — I700 Atherosclerosis of aorta: Secondary | ICD-10-CM | POA: Diagnosis present

## 2017-11-23 DIAGNOSIS — Z9841 Cataract extraction status, right eye: Secondary | ICD-10-CM

## 2017-11-23 DIAGNOSIS — R32 Unspecified urinary incontinence: Secondary | ICD-10-CM | POA: Diagnosis not present

## 2017-11-23 DIAGNOSIS — Z91040 Latex allergy status: Secondary | ICD-10-CM

## 2017-11-23 DIAGNOSIS — Z833 Family history of diabetes mellitus: Secondary | ICD-10-CM

## 2017-11-23 DIAGNOSIS — Z8546 Personal history of malignant neoplasm of prostate: Secondary | ICD-10-CM | POA: Diagnosis not present

## 2017-11-23 DIAGNOSIS — Z794 Long term (current) use of insulin: Secondary | ICD-10-CM

## 2017-11-23 DIAGNOSIS — Z87891 Personal history of nicotine dependence: Secondary | ICD-10-CM | POA: Diagnosis not present

## 2017-11-23 DIAGNOSIS — I130 Hypertensive heart and chronic kidney disease with heart failure and stage 1 through stage 4 chronic kidney disease, or unspecified chronic kidney disease: Principal | ICD-10-CM | POA: Diagnosis present

## 2017-11-23 DIAGNOSIS — Z7982 Long term (current) use of aspirin: Secondary | ICD-10-CM

## 2017-11-23 DIAGNOSIS — L97819 Non-pressure chronic ulcer of other part of right lower leg with unspecified severity: Secondary | ICD-10-CM | POA: Diagnosis present

## 2017-11-23 DIAGNOSIS — J9601 Acute respiratory failure with hypoxia: Secondary | ICD-10-CM | POA: Diagnosis present

## 2017-11-23 DIAGNOSIS — Z9049 Acquired absence of other specified parts of digestive tract: Secondary | ICD-10-CM

## 2017-11-23 DIAGNOSIS — E785 Hyperlipidemia, unspecified: Secondary | ICD-10-CM | POA: Diagnosis present

## 2017-11-23 DIAGNOSIS — G4733 Obstructive sleep apnea (adult) (pediatric): Secondary | ICD-10-CM | POA: Diagnosis present

## 2017-11-23 DIAGNOSIS — I952 Hypotension due to drugs: Secondary | ICD-10-CM | POA: Diagnosis not present

## 2017-11-23 DIAGNOSIS — Z9079 Acquired absence of other genital organ(s): Secondary | ICD-10-CM

## 2017-11-23 DIAGNOSIS — R0902 Hypoxemia: Secondary | ICD-10-CM

## 2017-11-23 DIAGNOSIS — Z888 Allergy status to other drugs, medicaments and biological substances status: Secondary | ICD-10-CM

## 2017-11-23 DIAGNOSIS — N182 Chronic kidney disease, stage 2 (mild): Secondary | ICD-10-CM | POA: Diagnosis present

## 2017-11-23 DIAGNOSIS — Z961 Presence of intraocular lens: Secondary | ICD-10-CM | POA: Diagnosis present

## 2017-11-23 DIAGNOSIS — Z9089 Acquired absence of other organs: Secondary | ICD-10-CM

## 2017-11-23 DIAGNOSIS — E1122 Type 2 diabetes mellitus with diabetic chronic kidney disease: Secondary | ICD-10-CM | POA: Diagnosis present

## 2017-11-23 DIAGNOSIS — J181 Lobar pneumonia, unspecified organism: Secondary | ICD-10-CM | POA: Diagnosis present

## 2017-11-23 DIAGNOSIS — Z9842 Cataract extraction status, left eye: Secondary | ICD-10-CM

## 2017-11-23 DIAGNOSIS — I248 Other forms of acute ischemic heart disease: Secondary | ICD-10-CM | POA: Diagnosis present

## 2017-11-23 DIAGNOSIS — Z79899 Other long term (current) drug therapy: Secondary | ICD-10-CM

## 2017-11-23 DIAGNOSIS — Z9989 Dependence on other enabling machines and devices: Secondary | ICD-10-CM

## 2017-11-23 DIAGNOSIS — I509 Heart failure, unspecified: Secondary | ICD-10-CM

## 2017-11-23 DIAGNOSIS — L089 Local infection of the skin and subcutaneous tissue, unspecified: Secondary | ICD-10-CM | POA: Diagnosis present

## 2017-11-23 DIAGNOSIS — Z8701 Personal history of pneumonia (recurrent): Secondary | ICD-10-CM | POA: Diagnosis not present

## 2017-11-23 DIAGNOSIS — Z91041 Radiographic dye allergy status: Secondary | ICD-10-CM

## 2017-11-23 DIAGNOSIS — B9562 Methicillin resistant Staphylococcus aureus infection as the cause of diseases classified elsewhere: Secondary | ICD-10-CM | POA: Diagnosis present

## 2017-11-23 DIAGNOSIS — T46995A Adverse effect of other agents primarily affecting the cardiovascular system, initial encounter: Secondary | ICD-10-CM | POA: Diagnosis not present

## 2017-11-23 DIAGNOSIS — Z8249 Family history of ischemic heart disease and other diseases of the circulatory system: Secondary | ICD-10-CM

## 2017-11-23 DIAGNOSIS — Z89512 Acquired absence of left leg below knee: Secondary | ICD-10-CM | POA: Diagnosis not present

## 2017-11-23 DIAGNOSIS — Z79891 Long term (current) use of opiate analgesic: Secondary | ICD-10-CM

## 2017-11-23 LAB — CBC WITH DIFFERENTIAL/PLATELET
BASOS ABS: 0.1 10*3/uL (ref 0–0.1)
Basophils Relative: 1 %
Eosinophils Absolute: 0.2 10*3/uL (ref 0–0.7)
Eosinophils Relative: 2 %
HEMATOCRIT: 36.7 % — AB (ref 40.0–52.0)
HEMOGLOBIN: 13.1 g/dL (ref 13.0–18.0)
Lymphocytes Relative: 9 %
Lymphs Abs: 0.9 10*3/uL — ABNORMAL LOW (ref 1.0–3.6)
MCH: 30.3 pg (ref 26.0–34.0)
MCHC: 35.7 g/dL (ref 32.0–36.0)
MCV: 84.9 fL (ref 80.0–100.0)
MONO ABS: 0.6 10*3/uL (ref 0.2–1.0)
Monocytes Relative: 6 %
NEUTROS ABS: 8.1 10*3/uL — AB (ref 1.4–6.5)
NEUTROS PCT: 82 %
Platelets: 368 10*3/uL (ref 150–440)
RBC: 4.33 MIL/uL — ABNORMAL LOW (ref 4.40–5.90)
RDW: 19 % — ABNORMAL HIGH (ref 11.5–14.5)
WBC: 9.8 10*3/uL (ref 3.8–10.6)

## 2017-11-23 LAB — COMPREHENSIVE METABOLIC PANEL
ALK PHOS: 241 U/L — AB (ref 38–126)
ALT: 30 U/L (ref 0–44)
AST: 36 U/L (ref 15–41)
Albumin: 3.2 g/dL — ABNORMAL LOW (ref 3.5–5.0)
Anion gap: 8 (ref 5–15)
BUN: 40 mg/dL — ABNORMAL HIGH (ref 8–23)
CALCIUM: 8.7 mg/dL — AB (ref 8.9–10.3)
CO2: 23 mmol/L (ref 22–32)
Chloride: 110 mmol/L (ref 98–111)
Creatinine, Ser: 0.93 mg/dL (ref 0.61–1.24)
GFR calc non Af Amer: >60 mL/min (ref >60)
Glucose, Bld: 111 mg/dL — ABNORMAL HIGH (ref 70–99)
Potassium: 4.7 mmol/L (ref 3.5–5.1)
SODIUM: 141 mmol/L (ref 135–145)
Total Bilirubin: 1.7 mg/dL — ABNORMAL HIGH (ref 0.3–1.2)
Total Protein: 6.8 g/dL (ref 6.5–8.1)

## 2017-11-23 LAB — GLUCOSE, CAPILLARY
GLUCOSE-CAPILLARY: 210 mg/dL — AB (ref 70–99)
Glucose-Capillary: 151 mg/dL — ABNORMAL HIGH (ref 70–99)

## 2017-11-23 LAB — STREP PNEUMONIAE URINARY ANTIGEN: Strep Pneumo Urinary Antigen: NEGATIVE

## 2017-11-23 LAB — BRAIN NATRIURETIC PEPTIDE: B Natriuretic Peptide: 1899 pg/mL — ABNORMAL HIGH (ref 0.0–100.0)

## 2017-11-23 LAB — TROPONIN I
TROPONIN I: 0.07 ng/mL — AB (ref <0.03)
TROPONIN I: 0.19 ng/mL — AB (ref <0.03)
Troponin I: 0.11 ng/mL (ref <0.03)

## 2017-11-23 LAB — BLOOD GAS, VENOUS
PO2 VEN: 32 mmHg (ref 32.0–45.0)
Patient temperature: 37
pCO2, Ven: 44 mmHg (ref 44.0–60.0)
pH, Ven: 7.37 (ref 7.250–7.430)

## 2017-11-23 LAB — INFLUENZA PANEL BY PCR (TYPE A & B)
INFLAPCR: NEGATIVE
Influenza B By PCR: NEGATIVE

## 2017-11-23 LAB — MRSA PCR SCREENING: MRSA by PCR: NEGATIVE

## 2017-11-23 MED ORDER — ASPIRIN EC 81 MG PO TBEC
81.0000 mg | DELAYED_RELEASE_TABLET | Freq: Every day | ORAL | Status: DC
  Administered 2017-11-23 – 2017-11-28 (×6): 81 mg via ORAL
  Filled 2017-11-23 (×6): qty 1

## 2017-11-23 MED ORDER — INSULIN ASPART 100 UNIT/ML ~~LOC~~ SOLN: 0.0000 [IU] | Freq: Every day | SUBCUTANEOUS | Status: DC

## 2017-11-23 MED ORDER — FUROSEMIDE 10 MG/ML IJ SOLN
40.0000 mg | Freq: Two times a day (BID) | INTRAMUSCULAR | Status: DC
  Administered 2017-11-23 – 2017-11-25 (×4): 40 mg via INTRAVENOUS
  Filled 2017-11-23 (×4): qty 4

## 2017-11-23 MED ORDER — DILTIAZEM HCL-DEXTROSE 100-5 MG/100ML-% IV SOLN (PREMIX)
5.0000 mg/h | INTRAVENOUS | Status: DC | PRN
  Filled 2017-11-23: qty 100

## 2017-11-23 MED ORDER — FUROSEMIDE 10 MG/ML IJ SOLN
40.0000 mg | Freq: Once | INTRAMUSCULAR | Status: AC
  Administered 2017-11-23: 40 mg via INTRAVENOUS
  Filled 2017-11-23: qty 4

## 2017-11-23 MED ORDER — LAMOTRIGINE 100 MG PO TABS
100.0000 mg | ORAL_TABLET | Freq: Two times a day (BID) | ORAL | Status: DC
  Administered 2017-11-23 – 2017-11-28 (×11): 100 mg via ORAL
  Filled 2017-11-23 (×11): qty 1

## 2017-11-23 MED ORDER — COLLAGENASE 250 UNIT/GM EX OINT
TOPICAL_OINTMENT | Freq: Every day | CUTANEOUS | Status: DC
  Administered 2017-11-23 – 2017-11-28 (×6): via TOPICAL
  Filled 2017-11-23: qty 30

## 2017-11-23 MED ORDER — DILTIAZEM HCL ER COATED BEADS 180 MG PO CP24
300.0000 mg | ORAL_CAPSULE | Freq: Every day | ORAL | Status: DC
  Administered 2017-11-24: 300 mg via ORAL
  Filled 2017-11-23 (×3): qty 1

## 2017-11-23 MED ORDER — RISPERIDONE 0.5 MG PO TABS
0.5000 mg | ORAL_TABLET | Freq: Every day | ORAL | Status: DC
  Administered 2017-11-23 – 2017-11-27 (×5): 0.5 mg via ORAL
  Filled 2017-11-23 (×6): qty 1

## 2017-11-23 MED ORDER — DIGOXIN 125 MCG PO TABS
0.1250 mg | ORAL_TABLET | Freq: Every day | ORAL | Status: DC
  Administered 2017-11-23 – 2017-11-28 (×6): 0.125 mg via ORAL
  Filled 2017-11-23 (×6): qty 1

## 2017-11-23 MED ORDER — VANCOMYCIN HCL IN DEXTROSE 1-5 GM/200ML-% IV SOLN
1000.0000 mg | Freq: Once | INTRAVENOUS | Status: AC
  Administered 2017-11-23: 1000 mg via INTRAVENOUS
  Filled 2017-11-23: qty 200

## 2017-11-23 MED ORDER — DILTIAZEM HCL 25 MG/5ML IV SOLN
10.0000 mg | Freq: Once | INTRAVENOUS | Status: AC
  Administered 2017-11-23: 10 mg via INTRAVENOUS
  Filled 2017-11-23: qty 5

## 2017-11-23 MED ORDER — DILTIAZEM HCL-DEXTROSE 100-5 MG/100ML-% IV SOLN (PREMIX)
5.0000 mg/h | INTRAVENOUS | Status: DC
  Filled 2017-11-23: qty 100

## 2017-11-23 MED ORDER — ATORVASTATIN CALCIUM 20 MG PO TABS
80.0000 mg | ORAL_TABLET | Freq: Every day | ORAL | Status: DC
  Administered 2017-11-23 – 2017-11-27 (×5): 80 mg via ORAL
  Filled 2017-11-23 (×5): qty 4

## 2017-11-23 MED ORDER — INSULIN ASPART 100 UNIT/ML ~~LOC~~ SOLN
0.0000 [IU] | Freq: Three times a day (TID) | SUBCUTANEOUS | Status: DC
  Administered 2017-11-23: 3 [IU] via SUBCUTANEOUS
  Administered 2017-11-24 (×2): 2 [IU] via SUBCUTANEOUS
  Administered 2017-11-25: 1 [IU] via SUBCUTANEOUS
  Administered 2017-11-25: 7 [IU] via SUBCUTANEOUS
  Administered 2017-11-25: 2 [IU] via SUBCUTANEOUS
  Administered 2017-11-26: 1 [IU] via SUBCUTANEOUS
  Administered 2017-11-26: 2 [IU] via SUBCUTANEOUS
  Administered 2017-11-26: 3 [IU] via SUBCUTANEOUS
  Administered 2017-11-27: 1 [IU] via SUBCUTANEOUS
  Administered 2017-11-27: 3 [IU] via SUBCUTANEOUS
  Administered 2017-11-27: 1 [IU] via SUBCUTANEOUS
  Administered 2017-11-28: 5 [IU] via SUBCUTANEOUS
  Filled 2017-11-23 (×14): qty 1

## 2017-11-23 MED ORDER — POTASSIUM CHLORIDE CRYS ER 20 MEQ PO TBCR
20.0000 meq | EXTENDED_RELEASE_TABLET | Freq: Every day | ORAL | Status: DC
  Administered 2017-11-23 – 2017-11-28 (×6): 20 meq via ORAL
  Filled 2017-11-23 (×6): qty 1

## 2017-11-23 MED ORDER — BISACODYL 5 MG PO TBEC
10.0000 mg | DELAYED_RELEASE_TABLET | Freq: Every day | ORAL | Status: DC | PRN
  Administered 2017-11-26: 10 mg via ORAL
  Filled 2017-11-23: qty 2

## 2017-11-23 MED ORDER — ONDANSETRON HCL 4 MG/2ML IJ SOLN: 4.0000 mg | Freq: Four times a day (QID) | INTRAMUSCULAR | Status: DC | PRN

## 2017-11-23 MED ORDER — SODIUM CHLORIDE 0.9% FLUSH: 3.0000 mL | INTRAVENOUS | Status: DC | PRN

## 2017-11-23 MED ORDER — DILTIAZEM HCL ER COATED BEADS 120 MG PO CP24
240.0000 mg | ORAL_CAPSULE | Freq: Every day | ORAL | Status: DC
  Administered 2017-11-23: 240 mg via ORAL
  Filled 2017-11-23: qty 1
  Filled 2017-11-23: qty 2

## 2017-11-23 MED ORDER — INSULIN GLARGINE 100 UNIT/ML ~~LOC~~ SOLN
10.0000 [IU] | Freq: Every day | SUBCUTANEOUS | Status: DC
  Administered 2017-11-23 – 2017-11-27 (×5): 10 [IU] via SUBCUTANEOUS
  Filled 2017-11-23 (×6): qty 0.1

## 2017-11-23 MED ORDER — PREMIER PROTEIN SHAKE
11.0000 [oz_av] | Freq: Two times a day (BID) | ORAL | Status: DC
  Administered 2017-11-24 – 2017-11-28 (×7): 11 [oz_av] via ORAL

## 2017-11-23 MED ORDER — SODIUM CHLORIDE 0.9 % IV SOLN
1.0000 g | Freq: Three times a day (TID) | INTRAVENOUS | Status: DC
  Administered 2017-11-23 – 2017-11-25 (×6): 1 g via INTRAVENOUS
  Filled 2017-11-23 (×8): qty 1

## 2017-11-23 MED ORDER — METOPROLOL SUCCINATE ER 50 MG PO TB24
150.0000 mg | ORAL_TABLET | Freq: Every day | ORAL | Status: DC
  Administered 2017-11-23 – 2017-11-24 (×2): 150 mg via ORAL
  Filled 2017-11-23 (×3): qty 1

## 2017-11-23 MED ORDER — OXYCODONE HCL ER 10 MG PO T12A
10.0000 mg | EXTENDED_RELEASE_TABLET | Freq: Two times a day (BID) | ORAL | Status: DC
  Administered 2017-11-23 – 2017-11-28 (×11): 10 mg via ORAL
  Filled 2017-11-23 (×11): qty 1

## 2017-11-23 MED ORDER — LEVOFLOXACIN IN D5W 750 MG/150ML IV SOLN
750.0000 mg | Freq: Once | INTRAVENOUS | Status: AC
  Administered 2017-11-23: 750 mg via INTRAVENOUS
  Filled 2017-11-23: qty 150

## 2017-11-23 MED ORDER — SODIUM CHLORIDE 0.9% FLUSH
3.0000 mL | Freq: Two times a day (BID) | INTRAVENOUS | Status: DC
  Administered 2017-11-23 – 2017-11-25 (×5): 3 mL via INTRAVENOUS

## 2017-11-23 MED ORDER — SODIUM CHLORIDE 0.9 % IV SOLN
250.0000 mL | INTRAVENOUS | Status: DC | PRN
  Administered 2017-11-23: 250 mL via INTRAVENOUS

## 2017-11-23 MED ORDER — RIVAROXABAN 20 MG PO TABS
20.0000 mg | ORAL_TABLET | Freq: Every day | ORAL | Status: DC
  Administered 2017-11-23: 20 mg via ORAL
  Filled 2017-11-23: qty 1

## 2017-11-23 MED ORDER — ENOXAPARIN SODIUM 40 MG/0.4ML ~~LOC~~ SOLN
40.0000 mg | SUBCUTANEOUS | Status: DC
  Administered 2017-11-23: 40 mg via SUBCUTANEOUS
  Filled 2017-11-23: qty 0.4

## 2017-11-23 NOTE — ED Notes (Signed)
EKG was not exported earlier when performed.

## 2017-11-23 NOTE — ED Notes (Signed)
Per RN [place] pharmacy to send up medication because it is not in any pyxis at this time

## 2017-11-23 NOTE — Plan of Care (Signed)
  Problem: Clinical Measurements: Goal: Ability to maintain clinical measurements within normal limits will improve Outcome: Progressing   Problem: Nutrition: Goal: Adequate nutrition will be maintained Outcome: Progressing   Problem: Elimination: Goal: Will not experience complications related to urinary retention Outcome: Progressing   Problem: Safety: Goal: Ability to remain free from injury will improve Outcome: Progressing

## 2017-11-23 NOTE — ED Notes (Signed)
Date and time results received: 11/23/17 1130 (use smartphrase ".now" to insert current time)  Test: troponin Critical Value: 0.07  Name of Provider Notified: [facility]  Orders Received? Or Actions Taken?: Orders Received - See Orders for details

## 2017-11-23 NOTE — ED Notes (Signed)
Pt given urinal to collect sample

## 2017-11-23 NOTE — H&P (Signed)
[facility] NAME: [name]    MR#:  [id number]  DATE OF BIRTH:  01/11/1935  DATE OF ADMISSION:  11/23/2017  PRIMARY CARE PHYSICIAN: System, Pcp Not In   REQUESTING/REFERRING PHYSICIAN: Dr. Jimmye Norman  CHIEF COMPLAINT:  Shortness of breath and irregular heartbeat  HISTORY OF PRESENT ILLNESS:  [name]  is a 83 y.o. male with a known history of chronic atrial for ablation on Xarelto, chronic diastolic CHF, diabetes mellitus, hyperlipidemia hypertension, multiple other medical problems is brought in from hospitals assisted living facility for shortness of breath which has been progressively getting worse.  BNP is elevated chest x-ray with infiltrates with effusion IV Lasix was given and antibiotics were started and hospitalist team is called to admit the patient.  PAST MEDICAL HISTORY:   Past Medical History:  Diagnosis Date  . Arthritis   . Atrial fibrillation ([place])   . Carcinoma of prostate ([place])   . CHF (congestive heart failure) ([place])   . Chronic kidney disease   . Diabetes mellitus without complication ([place])   . ED (erectile dysfunction)   . Frequent urination   . Hyperlipidemia   . Hypertension   . Moderate mitral insufficiency   . Peripheral vascular disease ([place])   . Pneumonia 04/2016  . Prostate cancer ([place])   . Sleep apnea    OSA--USE C-PAP  . Ulcer of left foot due to type 2 diabetes mellitus ([place])   . Urinary stress incontinence, male     PAST SURGICAL HISTOIRY:   Past Surgical History:  Procedure Laterality Date  . AMPUTATION Left 01/12/2017   Procedure: AMPUTATION BELOW KNEE;  Surgeon: Algernon Huxley, MD;  Location: [facility];  Service: General;  Laterality: Left;  . APLIGRAFT PLACEMENT Left 10/15/2016   Procedure: APLIGRAFT PLACEMENT;  Surgeon: Albertine Patricia, DPM;  Location: [facility];  Service: Podiatry;  Laterality: Left;  necrotic ulcer  . CHOLECYSTECTOMY    . EYE SURGERY Bilateral    Cataract Extraction with IOL  . IRRIGATION AND DEBRIDEMENT FOOT Left 10/15/2016   Procedure: IRRIGATION AND DEBRIDEMENT FOOT-EXCISIONAL DEBRIDEMENT OF SKIN 3RD, 4TH AND 5TH TOES WITH APPLICATION OF APLIGRAFT;  Surgeon: Albertine Patricia, DPM;  Location: [facility];  Service: Podiatry;  Laterality: Left;  necrotic,gangrene  . IRRIGATION AND DEBRIDEMENT FOOT Left 12/09/2016   Procedure: IRRIGATION AND DEBRIDEMENT FOOT-MUSCLE/FASCIA, RESECTION OF FOURTH AND FIFTH METATARSAL NECROTIC BONE;  Surgeon: Albertine Patricia, DPM;  Location: [facility];  Service: Podiatry;  Laterality: Left;  . LOWER EXTREMITY ANGIOGRAPHY Left 08/16/2016   Procedure: Lower Extremity Angiography;  Surgeon: Algernon Huxley, MD;  Location: [place] [facility];  Service: Cardiovascular;  Laterality: Left;  . LOWER EXTREMITY ANGIOGRAPHY Left 09/01/2016   Procedure: Lower Extremity Angiography;  Surgeon: Algernon Huxley, MD;  Location: [facility];  Service: Cardiovascular;  Laterality: Left;  . LOWER EXTREMITY ANGIOGRAPHY Left 10/14/2016   Procedure: Lower Extremity Angiography;  Surgeon: Algernon Huxley, MD;  Location: [facility];  Service: Cardiovascular;  Laterality: Left;  . LOWER EXTREMITY ANGIOGRAPHY Left 12/20/2016   Procedure: Lower Extremity Angiography;  Surgeon: Algernon Huxley, MD;  Location: [facility];  Service: Cardiovascular;  Laterality: Left;  . LOWER EXTREMITY INTERVENTION  09/01/2016   Procedure: Lower Extremity Intervention;  Surgeon: Algernon Huxley, MD;  Location: [facility];  Service: Cardiovascular;;  . PROSTATE SURGERY     removal  . TONSILLECTOMY     as  a child    SOCIAL HISTORY:   Social History   Tobacco Use  . Smoking status: Former Smoker    Packs/day: 0.25    Types: Cigarettes    Last attempt to quit: 10/14/1994    Years since quitting: 23.1  . Smokeless tobacco: Never Used  Substance Use Topics  . Alcohol use: No    Alcohol/week: 0.0 oz    FAMILY HISTORY:   Family  History  Problem Relation Age of Onset  . Hypertension Mother   . Diabetes Mother   . Prostate cancer Neg Hx   . Bladder Cancer Neg Hx   . Kidney disease Neg Hx     DRUG ALLERGIES:   Allergies  Allergen Reactions  . Contrast Media [Iodinated Diagnostic Agents] Shortness Of Breath    SOB  . Iohexol Shortness Of Breath     Desc: Respiratory Distress, Laryngedema, diaphoresis, Onset Date: [id number]   . Metrizamide Shortness Of Breath    SOB SOB SOB   . Latex Rash    REVIEW OF SYSTEMS:  CONSTITUTIONAL: No fever, fatigue or weakness.  EYES: No blurred or double vision.  EARS, NOSE, AND THROAT: No tinnitus or ear pain.  RESPIRATORY: Reports cough, shortness of breath, denies wheezing or hemoptysis.  CARDIOVASCULAR: No chest pain, orthopnea, edema.  GASTROINTESTINAL: No nausea, vomiting, diarrhea or abdominal pain.  GENITOURINARY: No dysuria, hematuria.  ENDOCRINE: No polyuria, nocturia,  HEMATOLOGY: No anemia, easy bruising or bleeding SKIN: No rash or lesion. MUSCULOSKELETAL: No joint pain or arthritis.   NEUROLOGIC: No tingling, numbness, weakness.  PSYCHIATRY: No anxiety or depression.   MEDICATIONS AT HOME:   Prior to Admission medications   Medication Sig Start Date End Date Taking? Authorizing Provider  aspirin EC 81 MG tablet Take 81 mg by mouth daily.   Yes [provider]  atorvastatin (LIPITOR) 80 MG tablet Take 80 mg by mouth daily at 6 PM.    Yes [provider]  bisacodyl (DULCOLAX) 5 MG EC tablet Take 10 mg by mouth daily as needed for moderate constipation.   Yes [provider]  digoxin (LANOXIN) 0.125 MG tablet Take 1 tablet (0.125 mg total) by mouth daily. 01/20/17  Yes Henreitta Leber, MD  diltiazem (CARDIZEM CD) 240 MG 24 hr capsule Take 300 mg by mouth daily.    Yes [provider]  insulin aspart (NOVOLOG) 100 UNIT/ML injection Inject 0-5 Units into the skin at bedtime. 10/28/17  Yes Mody, Ulice Bold, MD  insulin glargine  (LANTUS) 100 UNIT/ML injection Inject 0.1 mLs (10 Units total) into the skin at bedtime. 10/28/17  Yes Bettey Costa, MD  lamoTRIgine (LAMICTAL) 25 MG tablet Take 100 mg by mouth 2 (two) times daily.  04/20/17  Yes [provider]  metoprolol succinate (TOPROL-XL) 50 MG 24 hr tablet Take 3 tablets (150 mg total) by mouth daily. Take with or immediately following a meal. 10/29/17  Yes Mody, Sital, MD  ondansetron (ZOFRAN) 4 MG tablet Take 4 mg by mouth every 8 (eight) hours as needed for nausea or vomiting.   Yes [provider]  oxyCODONE (OXYCONTIN) 10 mg 12 hr tablet Take 10 mg by mouth every 12 (twelve) hours.   Yes [provider]  potassium chloride SA (K-DUR,KLOR-CON) 20 MEQ tablet Take 20 mEq by mouth daily.  03/29/17  Yes [provider]  protein supplement shake (PREMIER PROTEIN) LIQD Take 325 mLs (11 oz total) by mouth 2 (two) times daily between meals. 10/28/17 11/27/17 Yes Mody, Sital,  MD  risperiDONE (RISPERDAL) 0.5 MG tablet Take 1 tablet (0.5 mg total) by mouth at bedtime. 10/28/17  Yes Bettey Costa, MD  rivaroxaban (XARELTO) 20 MG TABS tablet Take 1 tablet (20 mg total) by mouth daily with supper. 10/28/17  Yes Bettey Costa, MD  collagenase (SANTYL) ointment Apply topically daily. Patient not taking: Reported on 11/23/2017 10/29/17   Bettey Costa, MD      VITAL SIGNS:  Blood pressure 105/72, pulse 77, temperature 98.6 F (37 C), temperature source Oral, resp. rate (!) 21, height 5\' 7"  (1.702 m), weight 81.4 kg (179 lb 7.3 oz), SpO2 95 %.  PHYSICAL EXAMINATION:  GENERAL:  83 y.o.-year-old patient lying in the bed with no acute distress.  EYES: Pupils equal, round, reactive to light and accommodation. No scleral icterus. Extraocular muscles intact.  HEENT: Head atraumatic, normocephalic. Oropharynx and nasopharynx clear.  NECK:  Supple, no jugular venous distention. No thyroid enlargement, no tenderness.  LUNGS: Normal breath sounds bilaterally, no wheezing,  rales,rhonchi or crepitation. No use of accessory muscles of respiration.  CARDIOVASCULAR: S1, S2 normal. No murmurs, rubs, or gallops.  ABDOMEN: Soft, nontender, nondistended. Bowel sounds present. No organomegaly or mass.  EXTREMITIES: Left BKA, right lower extremity shin with chronic wound  no pedal edema, cyanosis, or clubbing.  NEUROLOGIC: Cranial nerves II through XII are intact. Muscle strength 5/5 in all extremities. Sensation intact. Gait not checked.  PSYCHIATRIC: The patient is alert and oriented x 3.  SKIN: No obvious rash, lesion, or ulcer.   LABORATORY PANEL:   CBC Recent Labs  Lab 11/23/17 1056  WBC 9.8  HGB 13.1  HCT 36.7*  PLT 368   ------------------------------------------------------------------------------------------------------------------  Chemistries  Recent Labs  Lab 11/23/17 1056  NA 141  K 4.7  CL 110  CO2 23  GLUCOSE 111*  BUN 40*  CREATININE 0.93  CALCIUM 8.7*  AST 36  ALT 30  ALKPHOS 241*  BILITOT 1.7*   ------------------------------------------------------------------------------------------------------------------  Cardiac Enzymes Recent Labs  Lab 11/23/17 1404  TROPONINI 0.11*   ------------------------------------------------------------------------------------------------------------------  RADIOLOGY:  Dg Chest 2 View  Result Date: 11/23/2017 CLINICAL DATA:  83 year old male brought by EMS from assisted living. Chest pain, hypoxia on room air. EXAM: CHEST - 2 VIEW COMPARISON:  Chest radiograph 10/23/2017 and earlier. FINDINGS: Seated AP and lateral views of the chest. Lower lung volumes compared to 2018 with confluent increased lower lobe opacity on the lateral view which may be bilateral and retrocardiac on the frontal view. There is a small volume of pleural fluid tracking into the major fissures, and small suspected layering bilateral pleural effusions. Pulmonary vascularity appears stable from recent radiographs. No  pneumothorax. Possible chronic hiatal hernia. Stable cardiac size and mediastinal contours. Calcified aortic atherosclerosis. No acute osseous abnormality identified. Negative visible bowel gas pattern. IMPRESSION: 1. Confluent lower lobe opacity may be bilateral. Consider aspiration and/or pneumonia. 2. Small bilateral pleural effusions. Possible mild pulmonary interstitial edema. 3.  Aortic Atherosclerosis (ICD10-I70.0). Electronically Signed   By: Genevie Ann M.D.   On: 11/23/2017 11:39    EKG:   Orders placed or performed during the hospital encounter of 11/23/17  . ED EKG  . ED EKG  . EKG 12-Lead  . EKG 12-Lead    IMPRESSION AND PLAN:   [name]  is a 83 y.o. male with a known history of chronic atrial for ablation on Xarelto, chronic diastolic CHF, diabetes mellitus, hyperlipidemia hypertension, multiple other medical problems is brought in from hospitals assisted living facility for shortness of  breath which has been progressively getting worse.  BNP is elevated chest x-ray with infiltrates with effusion  #Acute respiratory distress secondary to acute diastolic CHF and pneumonia healthcare associated Admit to telemetry  Lasix IV Daily weight monitoring, intake and output Cardiology consult placed to Dr. Nehemiah Massed   #Healthcare associated pneumonia We will get sputum culture and sensitivity IV cefepime and vancomycin Urine for Legionella antigen and streptococcal antibody  #Atrial for ablation with RVR Resume p.o. Cardizem home medication and Cardizem drip for heart rate greater than 120 Continue home medication Xarelto  #Insulin-dependent diabetes mellitus Lantus and sliding scale insulin  #Essential hypertension continue home medication Toprol  DVT prophylaxis on Xarelto  All the records are reviewed and case discussed with ED provider. Management plans discussed with the patient, family and they are in agreement.  CODE STATUS: FC   TOTAL CARE TIME TAKING CARE OF  THIS PATIENT: 43  minutes.   Note: This dictation was prepared with Dragon dictation along with smaller phrase technology. Any transcriptional errors that result from this process are unintentional.  Nicholes Mango M.D on 11/23/2017 at 5:55 PM  Between 7am to 6pm - Pager - [phone]  After 6pm go to [email or web address] - password [email or web address] [facility]  Office  [phone]  CC: Primary care physician; System, Pcp Not In

## 2017-11-23 NOTE — ED Provider Notes (Signed)
[facility] Emergency Department Provider Note       Time seen: ----------------------------------------- 10:44 AM on 11/23/2017 -----------------------------------------   I have reviewed the triage vital signs and the nursing notes.  HISTORY   Chief Complaint Irregular Heart Beat    HPI [name] is a 83 y.o. male with a history of iritis, atrial fibrillation, CHF, diabetes, hyperlipidemia, hypertension, pneumonia who presents to the ED for no significant complaints.  EMS reports at the facility he lives in, he was slumped over face down.  Patient states he was just sleeping.  He was also noted to be hypoxic.  Patient states he does not typically use oxygen.  He was also noted to be in rapid atrial fibrillation and states he has not taken his medication this morning.  He denies fevers, chills or other complaints.  Past Medical History:  Diagnosis Date  . Arthritis   . Atrial fibrillation ([place])   . Carcinoma of prostate ([place])   . CHF (congestive heart failure) ([place])   . Chronic kidney disease   . Diabetes mellitus without complication ([place])   . ED (erectile dysfunction)   . Frequent urination   . Hyperlipidemia   . Hypertension   . Moderate mitral insufficiency   . Peripheral vascular disease ([place])   . Pneumonia 04/2016  . Prostate cancer ([place])   . Sleep apnea    OSA--USE C-PAP  . Ulcer of left foot due to type 2 diabetes mellitus ([place])   . Urinary stress incontinence, male     Patient Active Problem List   Diagnosis Date Noted  . Atherosclerosis of native arteries of the extremities with ulceration ([place]) 11/18/2017  . Atrial fibrillation with rapid ventricular response ([place]) 10/15/2017  . BKA stump complication ([place]) 02/40/9735  . 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 S/P BKA (below knee amputation) unilateral, left ([place]) 02/01/2017  . Chronic diastolic heart failure ([employer]) 01/21/2017  . Pressure  injury of skin 01/18/2017  . Atherosclerosis of artery of extremity with gangrene ([place]) 01/05/2017  . Diabetic foot ulcer ([place]) 12/29/2016  . Ataxia 10/21/2016  . History of femoral angiogram 09/06/2016  . Left leg pain 09/06/2016  . Leukocytosis 09/06/2016  . Generalized weakness 09/06/2016  . A-fib ([place]) 08/30/2016  . Pneumonia 05/19/2016  . Hyperlipidemia 04/20/2016  . Back pain 04/19/2016  . Type 2 diabetes mellitus treated with insulin ([place]) 04/19/2016  . Essential hypertension 04/19/2016  . PAD (peripheral artery disease) ([place]) 04/19/2016  . Erectile dysfunction following radical prostatectomy 06/25/2015  . History of prostate cancer 12/23/2014  . Incontinence 12/23/2014    Past Surgical History:  Procedure Laterality Date  . AMPUTATION Left 01/12/2017   Procedure: AMPUTATION BELOW KNEE;  Surgeon: Algernon Huxley, MD;  Location: [facility];  Service: General;  Laterality: Left;  . APLIGRAFT PLACEMENT Left 10/15/2016   Procedure: APLIGRAFT PLACEMENT;  Surgeon: Albertine Patricia, DPM;  Location: [facility];  Service: Podiatry;  Laterality: Left;  necrotic ulcer  . CHOLECYSTECTOMY    . EYE SURGERY Bilateral    Cataract Extraction with IOL  . IRRIGATION AND DEBRIDEMENT FOOT Left 10/15/2016   Procedure: IRRIGATION AND DEBRIDEMENT FOOT-EXCISIONAL DEBRIDEMENT OF SKIN 3RD, 4TH AND 5TH TOES WITH APPLICATION OF APLIGRAFT;  Surgeon: Albertine Patricia, DPM;  Location: [facility];  Service: Podiatry;  Laterality: Left;  necrotic,gangrene  . IRRIGATION AND DEBRIDEMENT FOOT Left 12/09/2016   Procedure: IRRIGATION AND DEBRIDEMENT FOOT-MUSCLE/FASCIA, RESECTION OF FOURTH AND FIFTH METATARSAL NECROTIC BONE;  Surgeon: Albertine Patricia, DPM;  Location: [facility];  Service: Podiatry;  Laterality: Left;  . LOWER EXTREMITY ANGIOGRAPHY Left 08/16/2016   Procedure: Lower Extremity Angiography;  Surgeon: Algernon Huxley, MD;  Location: [place] [facility];  Service: Cardiovascular;  Laterality: Left;  . LOWER EXTREMITY  ANGIOGRAPHY Left 09/01/2016   Procedure: Lower Extremity Angiography;  Surgeon: Algernon Huxley, MD;  Location: [place] [facility];  Service: Cardiovascular;  Laterality: Left;  . LOWER EXTREMITY ANGIOGRAPHY Left 10/14/2016   Procedure: Lower Extremity Angiography;  Surgeon: Algernon Huxley, MD;  Location: [place] [facility];  Service: Cardiovascular;  Laterality: Left;  . LOWER EXTREMITY ANGIOGRAPHY Left 12/20/2016   Procedure: Lower Extremity Angiography;  Surgeon: Algernon Huxley, MD;  Location: [place] [facility];  Service: Cardiovascular;  Laterality: Left;  . LOWER EXTREMITY INTERVENTION  09/01/2016   Procedure: Lower Extremity Intervention;  Surgeon: Algernon Huxley, MD;  Location: [place] [facility];  Service: Cardiovascular;;  . PROSTATE SURGERY     removal  . TONSILLECTOMY     as a child    Allergies Contrast media [iodinated diagnostic agents]; Iohexol; Metrizamide; and Latex  Social History Social History   Tobacco Use  . Smoking status: Former Smoker    Packs/day: 0.25    Types: Cigarettes    Last attempt to quit: 10/14/1994    Years since quitting: 23.1  . Smokeless tobacco: Never Used  Substance Use Topics  . Alcohol use: No    Alcohol/week: 0.0 oz  . Drug use: No   Review of Systems Constitutional: Negative for fever. Cardiovascular: Negative for chest pain. Respiratory: Positive for shortness of breath Gastrointestinal: Negative for abdominal pain, vomiting and diarrhea. Musculoskeletal: Negative for back pain. Skin: Negative for rash. Neurological: Negative for headaches, focal weakness or numbness.  All systems negative/normal/unremarkable except as stated in the HPI  ____________________________________________   PHYSICAL EXAM:  VITAL SIGNS: ED Triage Vitals  Enc Vitals Group     BP      Pulse      Resp      Temp      Temp src      SpO2      Weight      Height      Head Circumference      Peak Flow      Pain Score      Pain Loc      Pain Edu?       Excl. in [place]?    Constitutional: Alert and oriented.  No acute distress Eyes: Conjunctivae are normal. Normal extraocular movements. ENT   Head: Normocephalic and atraumatic.   Nose: No congestion/rhinnorhea.   Mouth/Throat: Mucous membranes are moist.   Neck: No stridor. Cardiovascular: Rapid rate, irregular rhythm no murmurs, rubs, or gallops. Respiratory: Normal respiratory effort without tachypnea nor retractions. Breath sounds are clear and equal bilaterally. No wheezes/rales/rhonchi. Gastrointestinal: Soft and nontender. Normal bowel sounds Musculoskeletal: Left below the knee amputation, there is a wound on the right lower extremity possible to the right ankle medially.  Appears to be healing well, no signs of cellulitis Neurologic:  Normal speech and language. No gross focal neurologic deficits are appreciated.  Skin: Right lower extremity ulceration near the right ankle Psychiatric: Mood and affect are normal. Speech and behavior are normal.  ____________________________________________  EKG: Interpreted by me.  Atrial for ablation with a rapid ventricular response, rate is 128 bpm, PVC, normal QT, normal axis  ____________________________________________  ED COURSE:  As part of my medical decision making, I reviewed the following data within the [place] History obtained from family if available, nursing notes, old chart and ekg, as well as notes from prior ED visits. Patient presented for shortness of breath and hypoxia, we will assess with labs and imaging as indicated at this time.   Procedures ____________________________________________   LABS (pertinent positives/negatives)  Labs Reviewed  CBC WITH DIFFERENTIAL/PLATELET - Abnormal; Notable for the following components:      Result Value   RBC 4.33 (*)    HCT 36.7 (*)    RDW 19.0 (*)    Neutro Abs 8.1 (*)    Lymphs Abs 0.9 (*)    All other components within normal limits   BRAIN NATRIURETIC PEPTIDE - Abnormal; Notable for the following components:   B Natriuretic Peptide 1,899.0 (*)    All other components within normal limits  TROPONIN I - Abnormal; Notable for the following components:   Troponin I 0.07 (*)    All other components within normal limits  COMPREHENSIVE METABOLIC PANEL - Abnormal; Notable for the following components:   Glucose, Bld 111 (*)    BUN 40 (*)    Calcium 8.7 (*)    Albumin 3.2 (*)    Alkaline Phosphatase 241 (*)    Total Bilirubin 1.7 (*)    All other components within normal limits  BLOOD GAS, VENOUS   CRITICAL CARE Performed by: Laurence Aly   Total critical care time: 30 minutes  Critical care time was exclusive of separately billable procedures and treating other patients.  Critical care was necessary to treat or prevent imminent or life-threatening deterioration.  Critical care was time spent personally by me on the following activities: development of treatment plan with patient and/or surrogate as well as nursing, discussions with consultants, evaluation of patient's response to treatment, examination of patient, obtaining history from patient or surrogate, ordering and performing treatments and interventions, ordering and review of laboratory studies, ordering and review of radiographic studies, pulse oximetry and re-evaluation of patient's condition.  RADIOLOGY  Chest x-ray IMPRESSION: 1. Confluent lower lobe opacity may be bilateral. Consider aspiration and/or pneumonia. 2. Small bilateral pleural effusions. Possible mild pulmonary interstitial edema. 3.  Aortic Atherosclerosis (ICD10-I70.0). ____________________________________________  DIFFERENTIAL DIAGNOSIS   Pneumonia, CHF, COPD, atrial fibrillation, PE  FINAL ASSESSMENT AND PLAN  Hypoxia, rapid atrial fibrillation, pneumonia, CHF exacerbation   Plan: The patient had presented for altered mental status and was noted to be hypoxic.  Patient's labs was likely indicative of congestive heart failure and volume overload. Patient's imaging revealed similar.  He has bilateral pleural effusions and a left lower lobe opacity.  We will be treating for both CHF and pneumonia.  I have ordered IV Levaquin as well as additional IV Lasix.  Patient states she is Artie taking Lasix every day.  He remains hypoxic off oxygen and his atrial fibrillation appears to be improved after IV and oral Cardizem.   Laurence Aly, MD   Note: This note was generated in part or whole with voice recognition software. Voice recognition is usually quite accurate but there are transcription errors that can and very often do occur. I apologize for any typographical errors that were not detected and corrected.     Earleen Newport, MD 11/23/17 1225

## 2017-11-23 NOTE — ED Triage Notes (Signed)
Pt here via EMS from [facility], denies any complaints, heart rate irregular with hx of the same, rate 112-140's per EMS, O2 sats 84% on RA. Alert and oriented, no cp.

## 2017-11-23 NOTE — ED Notes (Signed)
Patient transported to X-ray 

## 2017-11-24 ENCOUNTER — Inpatient Hospital Stay
Admit: 2017-11-24 | Discharge: 2017-11-24 | Disposition: A | Discharge status: Home / Self Care | Payer: Medicare Other | Attending: Internal Medicine | Admitting: Internal Medicine

## 2017-11-24 ENCOUNTER — Inpatient Hospital Stay: Payer: Medicare Other

## 2017-11-24 LAB — ECHOCARDIOGRAM COMPLETE
Height: 67 in
Weight: 2694.4 oz

## 2017-11-24 LAB — BASIC METABOLIC PANEL
ANION GAP: 6 (ref 5–15)
BUN: 37 mg/dL — ABNORMAL HIGH (ref 8–23)
CALCIUM: 8.8 mg/dL — AB (ref 8.9–10.3)
CO2: 29 mmol/L (ref 22–32)
Chloride: 107 mmol/L (ref 98–111)
Creatinine, Ser: 1.18 mg/dL (ref 0.61–1.24)
GFR, EST NON AFRICAN AMERICAN: 56 mL/min — AB (ref >60)
Glucose, Bld: 143 mg/dL — ABNORMAL HIGH (ref 70–99)
Potassium: 3.9 mmol/L (ref 3.5–5.1)
Sodium: 142 mmol/L (ref 135–145)

## 2017-11-24 LAB — BRAIN NATRIURETIC PEPTIDE: B Natriuretic Peptide: 2390 pg/mL — ABNORMAL HIGH (ref 0.0–100.0)

## 2017-11-24 LAB — GLUCOSE, CAPILLARY
GLUCOSE-CAPILLARY: 154 mg/dL — AB (ref 70–99)
GLUCOSE-CAPILLARY: 199 mg/dL — AB (ref 70–99)
GLUCOSE-CAPILLARY: 172 mg/dL — AB (ref 70–99)
Glucose-Capillary: 161 mg/dL — ABNORMAL HIGH (ref 70–99)

## 2017-11-24 LAB — TSH: TSH: 2.974 u[IU]/mL (ref 0.350–4.500)

## 2017-11-24 LAB — TROPONIN I: TROPONIN I: 0.21 ng/mL — AB (ref <0.03)

## 2017-11-24 LAB — HIV ANTIBODY (ROUTINE TESTING W REFLEX): HIV SCREEN 4TH GENERATION: NONREACTIVE

## 2017-11-24 LAB — PROCALCITONIN: Procalcitonin: <0.1 ng/mL

## 2017-11-24 MED ORDER — AZITHROMYCIN 250 MG PO TABS
500.0000 mg | ORAL_TABLET | Freq: Every day | ORAL | Status: AC
  Administered 2017-11-24: 500 mg via ORAL
  Filled 2017-11-24: qty 2

## 2017-11-24 MED ORDER — RIVAROXABAN 15 MG PO TABS
15.0000 mg | ORAL_TABLET | Freq: Every day | ORAL | Status: DC
  Administered 2017-11-24: 15 mg via ORAL
  Filled 2017-11-24: qty 1

## 2017-11-24 MED ORDER — SACUBITRIL-VALSARTAN 24-26 MG PO TABS
1.0000 | ORAL_TABLET | Freq: Two times a day (BID) | ORAL | Status: DC
  Administered 2017-11-24 – 2017-11-25 (×2): 1 via ORAL
  Filled 2017-11-24 (×2): qty 1

## 2017-11-24 MED ORDER — AZITHROMYCIN 250 MG PO TABS
250.0000 mg | ORAL_TABLET | Freq: Every day | ORAL | Status: DC
  Administered 2017-11-25: 250 mg via ORAL
  Filled 2017-11-24: qty 1

## 2017-11-24 MED ORDER — SODIUM CHLORIDE 0.9% FLUSH
3.0000 mL | Freq: Two times a day (BID) | INTRAVENOUS | Status: DC
  Administered 2017-11-24 – 2017-11-27 (×8): 3 mL via INTRAVENOUS

## 2017-11-24 NOTE — Progress Notes (Signed)
Patient ID: [name], male   DOB: 1935/03/14, 83 y.o.   MRN: [id number]  [facility] PROGRESS NOTE  [name] TMA:[id number] DOB: 1934/11/17 DOA: 11/23/2017 PCP: System, Pcp Not In  HPI/Subjective: Patient with some confusion with answers.  Could not tell me why he is in the hospital.  Objective: Vitals:   11/24/17 0730 11/24/17 1218  BP: (!) 140/92 (!) 143/66  Pulse: 75 100  Resp:    Temp: 98.2 F (36.8 C)   SpO2: 93% 93%    Filed Weights   11/23/17 1050 11/23/17 1353 11/24/17 0557  Weight: 79.4 kg (175 lb) 81.4 kg (179 lb 7.3 oz) 76.4 kg (168 lb 6.4 oz)    ROS: Review of Systems  Unable to perform ROS: Acuity of condition  Respiratory: Negative for shortness of breath.   Cardiovascular: Negative for chest pain.  Gastrointestinal: Negative for abdominal pain.   Exam: Physical Exam  HENT:  Nose: No mucosal edema.  Mouth/Throat: No oropharyngeal exudate or posterior oropharyngeal edema.  Eyes: Pupils are equal, round, and reactive to light. Conjunctivae and lids are normal.  Neck: No JVD present. Carotid bruit is not present. No edema present. No thyroid mass and no thyromegaly present.  Cardiovascular: S1 normal and S2 normal. Exam reveals no gallop.  No murmur heard. Pulses:      Dorsalis pedis pulses are 2+ on the right side, and 2+ on the left side.  Respiratory: No respiratory distress. He has decreased breath sounds in the right lower field and the left lower field. He has no wheezes. He has no rhonchi. He has no rales.  GI: Soft. Bowel sounds are normal. There is no tenderness.  Musculoskeletal:       Right ankle: He exhibits no swelling.       Left ankle: He exhibits no swelling.  Lymphadenopathy:    He has no cervical adenopathy.  Neurological: He is alert.  Able to move his extremities on his own.  Skin: Skin is warm. Nails show no clubbing.  Right shin positive for quarter size ulcer with some greenish discoloration.  Psychiatric:   Answers some questions but not all accurate with his answers.      Data Reviewed: Basic Metabolic Panel: Recent Labs  Lab 11/23/17 1056 11/24/17 0215  NA 141 142  K 4.7 3.9  CL 110 107  CO2 23 29  GLUCOSE 111* 143*  BUN 40* 37*  CREATININE 0.93 1.18  CALCIUM 8.7* 8.8*   Liver Function Tests: Recent Labs  Lab 11/23/17 1056  AST 36  ALT 30  ALKPHOS 241*  BILITOT 1.7*  PROT 6.8  ALBUMIN 3.2*   CBC: Recent Labs  Lab 11/23/17 1056  WBC 9.8  NEUTROABS 8.1*  HGB 13.1  HCT 36.7*  MCV 84.9  PLT 368   Cardiac Enzymes: Recent Labs  Lab 11/23/17 1056 11/23/17 1404 11/23/17 2000 11/24/17 0215  TROPONINI 0.07* 0.11* 0.19* 0.21*   BNP (last 3 results) Recent Labs    10/19/17 0659 11/23/17 1056 11/24/17 0215  BNP 2,483.0* 1,899.0* 2,390.0*     CBG: Recent Labs  Lab 11/23/17 1642 11/23/17 2033 11/24/17 0734 11/24/17 1125  GLUCAP 210* 151* 154* 161*    Recent Results (from the past 240 hour(s))  Culture, blood (routine x 2) Call MD if unable to obtain prior to antibiotics being given     Status: None (Preliminary result)   Collection Time: 11/23/17  2:04 PM  Result Value Ref Range Status   Specimen Description  BLOOD RIGHT ANTECUBITAL  Final   Special Requests   Final    BOTTLES DRAWN AEROBIC AND ANAEROBIC Blood Culture adequate volume   Culture   Final    NO GROWTH < 24 HOURS Performed at [facility], [address], [place], [place] [address]    Report Status PENDING  Incomplete  Culture, blood (routine x 2) Call MD if unable to obtain prior to antibiotics being given     Status: None (Preliminary result)   Collection Time: 11/23/17  2:14 PM  Result Value Ref Range Status   Specimen Description BLOOD BLOOD RIGHT WRIST  Final   Special Requests   Final    BOTTLES DRAWN AEROBIC AND ANAEROBIC Blood Culture adequate volume   Culture   Final    NO GROWTH < 24 HOURS Performed at [facility], [address] [place],  [place] [address]    Report Status PENDING  Incomplete  MRSA PCR Screening     Status: None   Collection Time: 11/23/17  2:56 PM  Result Value Ref Range Status   MRSA by PCR NEGATIVE NEGATIVE Final    Comment:        The GeneXpert MRSA Assay (FDA approved for NASAL specimens only), is one component of a comprehensive MRSA colonization surveillance program. It is not intended to diagnose MRSA infection nor to guide or monitor treatment for MRSA infections. Performed at [facility], [address], [place], [place] [address]      Studies: Dg Chest 2 View  Result Date: 11/23/2017 CLINICAL DATA:  83 year old male brought by EMS from assisted living. Chest pain, hypoxia on room air. EXAM: CHEST - 2 VIEW COMPARISON:  Chest radiograph 10/23/2017 and earlier. FINDINGS: Seated AP and lateral views of the chest. Lower lung volumes compared to 2018 with confluent increased lower lobe opacity on the lateral view which may be bilateral and retrocardiac on the frontal view. There is a small volume of pleural fluid tracking into the major fissures, and small suspected layering bilateral pleural effusions. Pulmonary vascularity appears stable from recent radiographs. No pneumothorax. Possible chronic hiatal hernia. Stable cardiac size and mediastinal contours. Calcified aortic atherosclerosis. No acute osseous abnormality identified. Negative visible bowel gas pattern. IMPRESSION: 1. Confluent lower lobe opacity may be bilateral. Consider aspiration and/or pneumonia. 2. Small bilateral pleural effusions. Possible mild pulmonary interstitial edema. 3.  Aortic Atherosclerosis (ICD10-I70.0). Electronically Signed   By: Genevie Ann M.D.   On: 11/23/2017 11:39   Ct Head Wo Contrast  Result Date: 11/24/2017 CLINICAL DATA:  83 year old male with a history of atrial fibrillation EXAM: CT HEAD WITHOUT CONTRAST TECHNIQUE: Contiguous axial images were obtained from the base of the skull through the vertex without  intravenous contrast. COMPARISON:  MR 10/23/2017, CT 10/23/2017 FINDINGS: Brain: No acute intracranial hemorrhage. No midline shift or mass effect. Gray-white differentiation is maintained. Unremarkable appearance of the ventricles. There is no new hypo dense region that corresponds to known right cerebellar infarction identified on prior MR. Unchanged appearance of the calcifications within the cerebellar parenchyma. Similar appearance of chronic white matter changes. Vascular: Calcifications of the anterior circulation Skull: No acute fracture.  No scalp swelling. Sinuses/Orbits: Unremarkable appearance of sinuses and orbits. Other: None IMPRESSION: CT negative for acute intracranial abnormality. Redemonstration of changes of chronic white matter disease and associated intracranial atherosclerosis. Electronically Signed   By: Corrie Mckusick D.O.   On: 11/24/2017 10:31    Scheduled Meds: . aspirin EC  81 mg Oral Daily  .  atorvastatin  80 mg Oral q1800  . collagenase   Topical Daily  . digoxin  0.125 mg Oral Daily  . diltiazem  300 mg Oral Daily  . furosemide  40 mg Intravenous Q12H  . insulin aspart  0-5 Units Subcutaneous QHS  . insulin aspart  0-5 Units Subcutaneous QHS  . insulin aspart  0-9 Units Subcutaneous TID WC  . insulin glargine  10 Units Subcutaneous QHS  . lamoTRIgine  100 mg Oral BID  . metoprolol succinate  150 mg Oral Daily  . oxyCODONE  10 mg Oral Q12H  . potassium chloride SA  20 mEq Oral Daily  . protein supplement shake  11 oz Oral BID BM  . risperiDONE  0.5 mg Oral QHS  . rivaroxaban  15 mg Oral Q supper  . sodium chloride flush  3 mL Intravenous Q12H  . sodium chloride flush  3 mL Intravenous Q12H   Continuous Infusions: . sodium chloride Stopped (11/23/17 1752)  . ceFEPime (MAXIPIME) IV Stopped (11/24/17 0802)    Assessment/Plan:  1. Acute hypoxic respiratory failure.  Patient looks like he is currently on 4 L of oxygen.  Try to taper this down as quickly as  possible. 2. Acute systolic congestive heart failure.  Diuresis with IV Lasix 40 mg IV twice daily.  On metoprolol.  Add low-dose Entresto.  Hopefully will be able to add spironolactone prior to discharge. 3. Bilateral pneumonia on Maxipime.  Vancomycin discontinued secondary to MRSA PCR being negative.  And Zithromax for atypicals. 4. Atrial fibrillation on metoprolol and Cardizem and Xarelto. 5. Chronic kidney disease stage III.  Monitor with diuresis. 6. Acute encephalopathy.  Could be secondary to pneumonia.  CT scan of the head negative for stroke. 7. Sleep apnea.  Order CPAP at night 8. Recent myocardial infarction and stroke as per wife.  Code Status:     Code Status Orders  (From admission, onward)        Start     Ordered   11/23/17 1344  Full code  Continuous     11/23/17 1346    Code Status History    Date Active Date Inactive Code Status Order ID Comments User Context   10/26/2017 1534 10/28/2017 1854 Full Code [id number]  Bettey Costa, MD Inpatient   10/26/2017 1437 10/26/2017 1534 Full Code [id number]  Feliberto Gottron, RN Inpatient   10/24/2017 1152 10/26/2017 1437 DNR [id number]  Bettey Costa, MD Inpatient   10/15/2017 0309 10/24/2017 1152 Full Code [id number]  Arta Silence, MD Inpatient   02/10/2017 1904 02/12/2017 1908 Full Code [id number]  Schnier, Dolores Lory, MD ED   02/10/2017 1638 02/10/2017 1904 Full Code [id number]  Sela Hua, PA-C Outpatient   01/08/2017 1342 01/19/2017 2103 Full Code [id number]  Loletha Grayer, MD ED   12/29/2016 2232 12/30/2016 1634 Full Code [id number]  Lance Coon, MD ED   12/20/2016 1251 12/20/2016 1842 Full Code [id number]  Algernon Huxley, MD Inpatient   10/22/2016 0040 10/24/2016 2058 Full Code [id number]  Lance Coon, MD Inpatient   10/14/2016 1201 10/14/2016 1925 Full Code [id number]  Algernon Huxley, MD Inpatient   08/30/2016 1338 09/06/2016 1941 Full Code [id number]  Epifanio Lesches, MD Inpatient   05/19/2016 0454 05/22/2016 1907 Full Code  [id number]  Saundra Shelling, MD Inpatient    Advance Directive Documentation     Most Recent Value  Type of Advance Directive  Living will  Pre-existing out of facility DNR order (yellow form or pink MOST form)  -  "  MOST" Form in Place?  -     Family Communication: Spoke with wife. Disposition Plan: Resident at [facility] fields  Antibiotics:  Maxipime  Zithromax  Time spent: 35 minutes including ACP time  [employer]

## 2017-11-24 NOTE — Progress Notes (Signed)
SLP Cancellation Note  Patient Details Name: [name] MRN: [id number] DOB: April 28, 1935   Cancelled treatment:       Reason Eval/Treat Not Completed: Patient at procedure or test/unavailable(chart reviewed; consulted NSG briefly. ) Pt is working w/ PT at this time. ST services will f/u tomorrow w/ BSE as ordered. Recommend general aspiration precautions; NPO status IF concern for aspiration/pulmonary issues. NSG agreed.     Orinda Kenner, MS, CCC-SLP Kieara Schwark 11/24/2017, 2:39 PM

## 2017-11-24 NOTE — Progress Notes (Signed)
Patient ID: [name], male   DOB: January 12, 1935, 83 y.o.   MRN: [id number]  ACP note  Patient unable to participate in this conversation at this time Spoke with wife.  Diagnosis: Acute hypoxic respiratory failure, acute systolic congestive heart failure, bilateral pneumonia, atrial fibrillation, chronic kidney disease stage III, acute encephalopathy, sleep apnea, recent myocardial infarction and stroke as per wife.  CODE STATUS discussed.  Patient is a full code.  Plan.  Try to taper down oxygen.  Continue diuresis and antibiotics at this time.  Overall prognosis is poor.  Time spent on ACP discussion 17 minutes Dr. Loletha Grayer

## 2017-11-24 NOTE — Progress Notes (Signed)
Pt has been refusing and taking off CPAP. Respiratory made aware. Pt was put back on his regular nasal canula. Will continue to monitor.

## 2017-11-24 NOTE — Clinical Social Work Note (Signed)
Clinical Social Work Assessment  Patient Details  Name: [name] MRN: [id number] Date of Birth: Sep 23, 1934  Date of referral:  11/24/17               Reason for consult:  Other (Comment Required)(From [name])                Permission sought to share information with:  Facility Art therapist granted to share information::  Yes, Verbal Permission Granted  Name::      [name]  Agency::     Relationship::     Contact Information:     Housing/Transportation Living arrangements for the past 2 months:  [name], [place] of Information:  Patient, Facility, Spouse Patient Interpreter Needed:  None Criminal Activity/Legal Involvement Pertinent to Current Situation/Hospitalization:  No - Comment as needed Significant Relationships:  Spouse Lives with:  Spouse Do you feel safe going back to the place where you live?  Yes Need for family participation in patient care:  Yes (Comment)  Care giving concerns:  Patient has been at [name] for short term rehab for 3 weeks.    Social Worker assessment / plan:  Holiday representative (CSW) received SNF consult. Per chart patient is from [name]. Per Sports administrator at [employer] patient is a short term rehab resident and can return if another [name] SNF authorization is received. Per [facility] he will ask his staff to start [facility] SNF authorization tomorrow because today is a holiday. CSW met with patient alone at bedside. Patient was alert and oriented X2. Per patient he has been at [name] for [facility] and is agreeable to return there. Patient reported that he lives with his wife [name] in [place]. CSW contacted patient's wife [name] and made her aware of above. Wife also wants patient to return to [name] and stated that he has been there for about 3 weeks. FL2 complete. CSW will continue to follow and assist as needed.   Employment status:  Retired Designer, industrial/product PT Recommendations:  Not assessed at this time Information / Referral to community resources:  [place]  Patient/Family's Response to care:  Patient and his wife are agreeable for [employer] [facility].   Patient/Family's Understanding of and Emotional Response to Diagnosis, Current Treatment, and Prognosis:  Patient and his wife were very pleasant and thanked CSW for assistance.   Emotional Assessment Appearance:  Appears stated age Attitude/Demeanor/Rapport:    Affect (typically observed):  Accepting, Adaptable, Pleasant Orientation:  Oriented to Self, Oriented to Place, Oriented to  Time, Oriented to Situation Alcohol / Substance use:  Not Applicable Psych involvement (Current and /or in the community):  No (Comment)  Discharge Needs  Concerns to be addressed:  Discharge Planning Concerns Readmission within the last 30 days:  No Current discharge risk:  Dependent with Mobility Barriers to Discharge:  Continued Medical Work up   [employer], Veronia Beets, LCSW 11/24/2017, 12:32 PM

## 2017-11-24 NOTE — Progress Notes (Signed)
In the morning he was sleeping, responded to voice but didn't engage.  He is now fully awake, conversing with family.  Freely moves himself in the bed.

## 2017-11-24 NOTE — NC FL2 (Signed)
[place] LEVEL OF CARE SCREENING TOOL     IDENTIFICATION  Patient Name: [name] Birthdate: [birth year] Sex: male Admission Date (Current Location): 11/23/2017  [place] and [place] Number:  Engineering geologist and Address:  [facility], [address] [place], [place] [address]      Provider Number: [id number]  Attending Physician Name and Address:  Loletha Grayer, MD  Relative Name and Phone Number:       Current Level of Care: Hospital Recommended Level of Care: [place] Prior Approval Number:    Date Approved/Denied:   PASRR Number: [id number])  Discharge Plan: SNF    Current Diagnoses: Patient Active Problem List   Diagnosis Date Noted  . Acute CHF (congestive heart failure) ([place]) 11/23/2017  . Atherosclerosis of native arteries of the extremities with ulceration ([place]) 11/18/2017  . Atrial fibrillation with rapid ventricular response ([place]) 10/15/2017  . BKA stump complication ([place]) 99/37/1696  . 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 S/P BKA (below knee amputation) unilateral, left ([place]) 02/01/2017  . Chronic diastolic heart failure ([place]) 01/21/2017  . Pressure injury of skin 01/18/2017  . 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 Diabetic foot ulcer ([place]) 12/29/2016  . Ataxia 10/21/2016  . History of femoral angiogram 09/06/2016  . Left leg pain 09/06/2016  . Leukocytosis 09/06/2016  . Generalized weakness 09/06/2016  . A-fib ([place]) 08/30/2016  . Pneumonia 05/19/2016  . Hyperlipidemia 04/20/2016  . Back pain 04/19/2016  . Type 2 diabetes mellitus treated with insulin ([place]) 04/19/2016  . Essential hypertension 04/19/2016  . PAD (peripheral artery disease) ([place]) 04/19/2016  . Erectile dysfunction following radical prostatectomy 06/25/2015  . History of prostate cancer 12/23/2014  . Incontinence 12/23/2014    Orientation  RESPIRATION BLADDER Height & Weight     Self  O2(3 Liters Oxygen. ) Incontinent Weight: 168 lb 6.4 oz (76.4 kg) Height:  5\' 7"  (170.2 cm)  BEHAVIORAL SYMPTOMS/MOOD NEUROLOGICAL BOWEL NUTRITION STATUS      Continent Diet(Diet: Heart Healthy/ Carb Modified. )  AMBULATORY STATUS COMMUNICATION OF NEEDS Skin   Extensive Assist Verbally Normal                       Personal Care Assistance Level of Assistance  Bathing, Feeding, Dressing Bathing Assistance: Limited assistance Feeding assistance: Independent Dressing Assistance: Limited assistance     Functional Limitations Info  Sight, Hearing, Speech Sight Info: Adequate Hearing Info: Adequate Speech Info: Adequate    SPECIAL CARE FACTORS FREQUENCY  PT (By licensed PT), OT (By licensed OT)     PT Frequency: (5) OT Frequency: (5)            Contractures      Additional Factors Info  Code Status, Allergies, Isolation Precautions Code Status Info: (Full Code. ) Allergies Info: (Contrast Media Iodinated Diagnostic Agents, Iohexol, Metrizamide, Latex)     Isolation Precautions Info: (history of MRSA )     Current Medications (11/24/2017):  This is the current hospital active medication list Current Facility-Administered Medications  Medication Dose Route Frequency Provider Last Rate Last Dose  . 0.9 %  sodium chloride infusion  250 mL Intravenous PRN Nicholes Mango, MD   Stopped at 11/23/17 1752  . aspirin EC tablet 81 mg  81 mg Oral Daily Gouru, Aruna, MD   81 mg at 11/23/17 1524  . atorvastatin (LIPITOR) tablet 80 mg  80 mg Oral  [id number] Nicholes Mango, MD   80 mg at 11/23/17 1743  . bisacodyl (DULCOLAX) EC tablet 10 mg  10 mg Oral Daily PRN Gouru, Aruna, MD      . ceFEPIme (MAXIPIME) 1 g in sodium chloride 0.9 % 100 mL IVPB  1 g Intravenous Q8H Nicholes Mango, MD   Stopped at 11/24/17 0802  . collagenase (SANTYL) ointment   Topical Daily Gouru, Aruna, MD      . digoxin (LANOXIN) tablet 0.125 mg  0.125 mg Oral Daily Gouru,  Aruna, MD   0.125 mg at 11/23/17 1525  . diltiazem (CARDIZEM CD) 24 hr capsule 300 mg  300 mg Oral Daily Gouru, Aruna, MD      . diltiazem (CARDIZEM) 100 mg in dextrose 5% 161mL (1 mg/mL) infusion  5-15 mg/hr Intravenous PRN Gouru, Aruna, MD      . furosemide (LASIX) injection 40 mg  40 mg Intravenous Q12H Gouru, Aruna, MD   40 mg at 11/24/17 0558  . insulin aspart (novoLOG) injection 0-5 Units  0-5 Units Subcutaneous QHS Gouru, Aruna, MD      . insulin aspart (novoLOG) injection 0-5 Units  0-5 Units Subcutaneous QHS Gouru, Aruna, MD      . insulin aspart (novoLOG) injection 0-9 Units  0-9 Units Subcutaneous TID WC Nicholes Mango, MD   2 Units at 11/24/17 0801  . insulin glargine (LANTUS) injection 10 Units  10 Units Subcutaneous QHS Nicholes Mango, MD   10 Units at 11/23/17 2154  . lamoTRIgine (LAMICTAL) tablet 100 mg  100 mg Oral BID Gouru, Aruna, MD   100 mg at 11/23/17 2154  . metoprolol succinate (TOPROL-XL) 24 hr tablet 150 mg  150 mg Oral Daily Gouru, Aruna, MD   150 mg at 11/23/17 1500  . ondansetron (ZOFRAN) injection 4 mg  4 mg Intravenous Q6H PRN Gouru, Aruna, MD      . oxyCODONE (OXYCONTIN) 12 hr tablet 10 mg  10 mg Oral Q12H Gouru, Aruna, MD   10 mg at 11/23/17 2155  . potassium chloride SA (K-DUR,KLOR-CON) CR tablet 20 mEq  20 mEq Oral Daily Gouru, Aruna, MD   20 mEq at 11/23/17 1524  . protein supplement (PREMIER PROTEIN) liquid - approved for s/p bariatric surgery  11 oz Oral BID BM Gouru, Aruna, MD      . risperiDONE (RISPERDAL) tablet 0.5 mg  0.5 mg Oral QHS Gouru, Aruna, MD   0.5 mg at 11/23/17 2155  . rivaroxaban (XARELTO) tablet 20 mg  20 mg Oral Q supper Gouru, Aruna, MD   20 mg at 11/23/17 1743  . sodium chloride flush (NS) 0.9 % injection 3 mL  3 mL Intravenous Q12H Gouru, Aruna, MD   3 mL at 11/23/17 2200  . sodium chloride flush (NS) 0.9 % injection 3 mL  3 mL Intravenous PRN Gouru, Aruna, MD       Facility-Administered Medications Ordered in Other Encounters  Medication  Dose Route Frequency Provider Last Rate Last Dose  . acetaminophen (TYLENOL) tablet 650 mg  650 mg Oral Q6H PRN Stegmayer, Kimberly A, PA-C       Or  . acetaminophen (TYLENOL) suppository 650 mg  650 mg Rectal Q6H PRN Stegmayer, Kimberly A, PA-C      . ondansetron (ZOFRAN) tablet 4 mg  4 mg Oral Q6H PRN Stegmayer, Kimberly A, PA-C       Or  . ondansetron (ZOFRAN) 4 mg in sodium chloride 0.9 % 50 mL IVPB  4 mg Intravenous Q6H PRN Stegmayer,  Janalyn Harder, PA-C         Discharge Medications: Please see discharge summary for a list of discharge medications.  Relevant Imaging Results:  Relevant Lab Results:   Additional Information (SSN: [id number])  Tata Timmins, Veronia Beets, LCSW

## 2017-11-24 NOTE — Evaluation (Signed)
Physical Therapy Evaluation Patient Details Name: [name] MRN: [id number] DOB: Feb 20, 1935 Today's Date: 11/24/2017   History of Present Illness  83 y.o. male with a known history of chronic atrial for ablation on Xarelto, chronic diastolic CHF, diabetes mellitus, hyperlipidemia, hypertension, s/p L BKA, per family when he was hospitalized ~1 month ago with cardiac issues he also suffered CVA.  He's admitted from assisted living facility for shortness of breath/weakness which has been progressively getting worse.    Clinical Impression  Pt pleasant t/o the session, but struggled to consistently follow instructions well and needed extra cues and time to participate.  Pt was able to assist with bed mobility and 2 attempts at standing but pt was weak and struggled to coordinate mobility and process all instructions.  Family reports that pt has been able to do some walking (with prosthetic) at rehab but that his mental status since CVA has been so up and down that he struggles to maintain activity levels over time.  He showed great effort t/o PT exam and with mobility/transfers apart from the exam but could not manage to maintain standing balance or attempt to get to recliner.  Pt will need to return to STR on discharge, wife to bring prosthetic.    Follow Up Recommendations SNF    Equipment Recommendations  None recommended by PT    Recommendations for Other Services       Precautions / Restrictions Precautions Precautions: Fall Required Braces or Orthoses: (family to bring L BKA prosthetic ) Restrictions Weight Bearing Restrictions: No      Mobility  Bed Mobility Overal bed mobility: Needs Assistance Bed Mobility: Supine to Sit;Sit to Supine     Supine to sit: Min assist Sit to supine: Min guard   General bed mobility comments: Again pt needed some extra cuing/encouragement to initiate movement to EOB but needed only light assis to get to sitting  Transfers Overall  transfer level: Needs assistance Equipment used: Rolling walker (2 wheeled) Transfers: Sit to/from Stand Sit to Stand: Mod assist         General transfer comment: Cuing for hand/foot placement and sequencing, pt needed a lot of explanation.  Struggled to shift weight forward and then unable to fully extend R knee and maintain upright w/o direct assist.  Pt did not have L prosthetic which he does use daily for standing  Ambulation/Gait             General Gait Details: Pt unable/unsafe to attempt standing today  Stairs            Wheelchair Mobility    Modified Rankin (Stroke Patients Only)       Balance Overall balance assessment: Needs assistance Sitting-balance support: Bilateral upper extremity supported Sitting balance-Leahy Scale: Fair       Standing balance-Leahy Scale: Poor Standing balance comment: Pt highly reliant on walker, unable to fully extend R knee, unable to maintain any prolonged standing                             Pertinent Vitals/Pain Pain Assessment: No/denies pain    Home Living Family/patient expects to be discharged to:: Skilled nursing facility                 Additional Comments: At baseline, lives with wife in single-story home with ramp access.  Wife able to provide 24/7 supervision as needed    Prior Function Level of Independence: Needs  assistance         Comments: Since recent CVA pt has had a much harder time with processing information and per family he has considerable swings with good vs bad days.     Hand Dominance        Extremity/Trunk Assessment   Upper Extremity Assessment Upper Extremity Assessment: Generalized weakness;Overall [facility] for tasks assessed;Difficult to assess due to impaired cognition(more limited with instruction following than pure MMT/ROM)    Lower Extremity Assessment Lower Extremity Assessment: Generalized weakness;Overall [facility] for tasks assessed;Difficult to assess due  to impaired cognition(more limited with instruction following than pure MMT/ROM)       Communication   Communication: No difficulties  Cognition Arousal/Alertness: Awake/alert Behavior During Therapy: (Pt needing repeated cuing and break down of expected tasks) Overall Cognitive Status: Impaired/Different from baseline(pt has been more confused over the last month)                                        General Comments      Exercises     Assessment/Plan    PT Assessment Patient needs continued PT services  PT Problem List Decreased strength;Decreased range of motion;Decreased activity tolerance;Decreased balance;Decreased mobility;Decreased coordination;Decreased cognition;Decreased knowledge of use of DME;Decreased safety awareness;Decreased knowledge of precautions       PT Treatment Interventions DME instruction;Gait training;Stair training;Functional mobility training;Therapeutic activities;Therapeutic exercise;Balance training;Neuromuscular re-education;Cognitive remediation;Patient/family education    PT Goals (Current goals can be found in the Care Plan section)  Acute Rehab PT Goals Patient Stated Goal: work at rehab to get back to walking and being able to go home PT Goal Formulation: With patient/family Time For Goal Achievement: 12/08/17 Potential to Achieve Goals: Fair    Frequency Min 2X/week   Barriers to discharge        Co-evaluation               AM-PAC PT "6 Clicks" Daily Activity  Outcome Measure Difficulty turning over in bed (including adjusting bedclothes, sheets and blankets)?: A Lot Difficulty moving from lying on back to sitting on the side of the bed? : Unable Difficulty sitting down on and standing up from a chair with arms (e.g., wheelchair, bedside commode, etc,.)?: Unable Help needed moving to and from a bed to chair (including a wheelchair)?: Total Help needed walking in hospital room?: Total Help needed climbing  3-5 steps with a railing? : Total 6 Click Score: 7    End of Session Equipment Utilized During Treatment: Gait belt Activity Tolerance: Patient limited by fatigue(confusion/problem solving a big limiter) Patient left: with bed alarm set;with call bell/phone within reach;with family/visitor present Nurse Communication: Mobility status PT Visit Diagnosis: Muscle weakness (generalized) (M62.81);Difficulty in walking, not elsewhere classified (R26.2)    Time: 6503-5465 PT Time Calculation (min) (ACUTE ONLY): 41 min   Charges:   PT Evaluation $PT Eval Low Complexity: 1 Low PT Treatments $Therapeutic Activity: 8-22 mins   PT G Codes:        Kreg Shropshire, DPT 11/24/2017, 5:04 PM

## 2017-11-24 NOTE — Plan of Care (Signed)
  Problem: Education: Goal: Knowledge of General Education information will improve Outcome: Progressing   Problem: Coping: Goal: Level of anxiety will decrease Outcome: Progressing   Problem: Pain Managment: Goal: General experience of comfort will improve Outcome: Progressing   Problem: Safety: Goal: Ability to remain free from injury will improve Outcome: Progressing

## 2017-11-25 ENCOUNTER — Ambulatory Visit: Payer: Medicare Other | Admitting: Family

## 2017-11-25 LAB — BASIC METABOLIC PANEL
ANION GAP: 11 (ref 5–15)
BUN: 40 mg/dL — AB (ref 8–23)
CALCIUM: 8.3 mg/dL — AB (ref 8.9–10.3)
CO2: 26 mmol/L (ref 22–32)
Chloride: 103 mmol/L (ref 98–111)
Creatinine, Ser: 1.04 mg/dL (ref 0.61–1.24)
GFR calc Af Amer: >60 mL/min (ref >60)
GLUCOSE: 183 mg/dL — AB (ref 70–99)
POTASSIUM: 3.8 mmol/L (ref 3.5–5.1)
Sodium: 140 mmol/L (ref 135–145)

## 2017-11-25 LAB — GLUCOSE, CAPILLARY
GLUCOSE-CAPILLARY: 311 mg/dL — AB (ref 70–99)
GLUCOSE-CAPILLARY: 133 mg/dL — AB (ref 70–99)
Glucose-Capillary: 174 mg/dL — ABNORMAL HIGH (ref 70–99)
Glucose-Capillary: 158 mg/dL — ABNORMAL HIGH (ref 70–99)

## 2017-11-25 LAB — PROCALCITONIN: Procalcitonin: <0.1 ng/mL

## 2017-11-25 MED ORDER — DOXYCYCLINE HYCLATE 100 MG PO TABS
100.0000 mg | ORAL_TABLET | Freq: Two times a day (BID) | ORAL | Status: DC
  Administered 2017-11-25 – 2017-11-28 (×7): 100 mg via ORAL
  Filled 2017-11-25 (×7): qty 1

## 2017-11-25 MED ORDER — IPRATROPIUM-ALBUTEROL 0.5-2.5 (3) MG/3ML IN SOLN
3.0000 mL | Freq: Three times a day (TID) | RESPIRATORY_TRACT | Status: DC
  Administered 2017-11-25 – 2017-11-26 (×4): 3 mL via RESPIRATORY_TRACT
  Filled 2017-11-25 (×5): qty 3

## 2017-11-25 MED ORDER — IPRATROPIUM-ALBUTEROL 0.5-2.5 (3) MG/3ML IN SOLN
3.0000 mL | Freq: Four times a day (QID) | RESPIRATORY_TRACT | Status: DC
  Administered 2017-11-25: 3 mL via RESPIRATORY_TRACT
  Filled 2017-11-25: qty 3

## 2017-11-25 MED ORDER — FUROSEMIDE 10 MG/ML IJ SOLN: 20.0000 mg | Freq: Two times a day (BID) | INTRAMUSCULAR | Status: DC

## 2017-11-25 MED ORDER — METOPROLOL SUCCINATE ER 25 MG PO TB24
25.0000 mg | ORAL_TABLET | Freq: Every day | ORAL | Status: DC
Start: 2017-11-25 — End: 2017-11-26

## 2017-11-25 MED ORDER — RIVAROXABAN 20 MG PO TABS
20.0000 mg | ORAL_TABLET | Freq: Every day | ORAL | Status: DC
  Administered 2017-11-25 – 2017-11-28 (×4): 20 mg via ORAL
  Filled 2017-11-25 (×4): qty 1

## 2017-11-25 MED ORDER — BUDESONIDE 0.5 MG/2ML IN SUSP
0.5000 mg | Freq: Two times a day (BID) | RESPIRATORY_TRACT | Status: DC
  Administered 2017-11-25 – 2017-11-26 (×3): 0.5 mg via RESPIRATORY_TRACT
  Filled 2017-11-25 (×4): qty 2

## 2017-11-25 MED ORDER — DILTIAZEM HCL ER COATED BEADS 180 MG PO CP24: 180.0000 mg | ORAL_CAPSULE | Freq: Every day | ORAL | Status: DC

## 2017-11-25 MED ORDER — METOPROLOL SUCCINATE ER 50 MG PO TB24: 50.0000 mg | ORAL_TABLET | Freq: Every day | ORAL | Status: DC

## 2017-11-25 NOTE — Progress Notes (Signed)
Talked to Dr. Manuella Ghazi about patient's BP of 102/80, HR is 90's - low 100's patient has scheduled metoprolol, order to hold. RN will continue to monitor.

## 2017-11-25 NOTE — Care Management Important Message (Signed)
Copy of signed IM left with patient in room.  

## 2017-11-25 NOTE — Progress Notes (Signed)
Physical Therapy Treatment Patient Details Name: [name] MRN: [id number] DOB: 1934-08-21 Today's Date: 11/25/2017    History of Present Illness 83 y.o. male with a history of chronic atrial fibulation on Xarelto, chronic diastolic CHF, diabetes mellitus, hyperlipidemia, hypertension, s/p L BKA, per family when he was hospitalized ~1 month ago with cardiac issues he also suffered CVA.  He's admitted from assisted living facility for shortness of breath/weakness which has been progressively getting worse.      PT Comments    Pt showed good effort with supine exercises and generally had functional strength.  His biggest issue was processing even simple instructions on proper form with exercises or even starting to do other movement s during basic repeated exercises.  Pt very pleasant and eager to work with PT but did need a lot of redirection to stay on task.  Hope to be able to try standing using prosthetic next visit per BP levels (just 78/61 today).   Follow Up Recommendations  SNF     Equipment Recommendations  None recommended by PT    Recommendations for Other Services       Precautions / Restrictions Precautions Precautions: Fall Restrictions Weight Bearing Restrictions: No    Mobility  Bed Mobility               General bed mobility comments: Deferred mobility secondary to very low blood pressures  Transfers                    Ambulation/Gait                 Stairs             Wheelchair Mobility    Modified Rankin (Stroke Patients Only)       Balance                                            Cognition Arousal/Alertness: Awake/alert Behavior During Therapy: (continues to need repeated cuing to perform tasks fully) Overall Cognitive Status: History of cognitive impairments - at baseline("new baseline" since recent CVA)                                        Exercises General  Exercises - Lower Extremity Ankle Circles/Pumps: Strengthening;10 reps;Right Quad Sets: Strengthening;10 reps;Both Gluteal Sets: Strengthening;10 reps;Both Short Arc Quad: AROM;Strengthening;10 reps;Both Heel Slides: AAROM;10 reps;Right Hip ABduction/ADduction: Strengthening;10 reps;Both Straight Leg Raises: AROM;10 reps;Both    General Comments General comments (skin integrity, edema, etc.): Pt has had low BP recently, discussed with nursing about getting up - checked BP first and he was 78/61 PT and nurse agree that supine exercises only is probably most appropriate today.       Pertinent Vitals/Pain Pain Assessment: No/denies pain    Home Living                      Prior Function            PT Goals (current goals can now be found in the care plan section) Progress towards PT goals: Progressing toward goals    Frequency    Min 2X/week      PT Plan Current plan remains appropriate    Co-evaluation  AM-PAC PT "6 Clicks" Daily Activity  Outcome Measure  Difficulty turning over in bed (including adjusting bedclothes, sheets and blankets)?: A Lot Difficulty moving from lying on back to sitting on the side of the bed? : Unable Difficulty sitting down on and standing up from a chair with arms (e.g., wheelchair, bedside commode, etc,.)?: Unable Help needed moving to and from a bed to chair (including a wheelchair)?: Total Help needed walking in hospital room?: Total Help needed climbing 3-5 steps with a railing? : Total 6 Click Score: 7    End of Session   Activity Tolerance: (deferred mobility secondary to hypotension) Patient left: with bed alarm set;with call bell/phone within reach Nurse Communication: Mobility status PT Visit Diagnosis: Muscle weakness (generalized) (M62.81);Difficulty in walking, not elsewhere classified (R26.2)     Time: 2426-8341 PT Time Calculation (min) (ACUTE ONLY): 18 min  Charges:  $Therapeutic Exercise:  8-22 mins                    G Codes:       Kreg Shropshire, DPT 11/25/2017, 3:56 PM

## 2017-11-25 NOTE — Plan of Care (Signed)
  Problem: Education: Goal: Knowledge of General Education information will improve Outcome: Progressing   Problem: Coping: Goal: Level of anxiety will decrease Outcome: Progressing   Problem: Pain Managment: Goal: General experience of comfort will improve Outcome: Progressing   Problem: Safety: Goal: Ability to remain free from injury will improve Outcome: Progressing

## 2017-11-25 NOTE — Clinical Social Work Note (Signed)
CSW received call from [facility] admissions coordinator at [facility] and she has obtained [facility] authorization for patient.   [place], [place]

## 2017-11-25 NOTE — Progress Notes (Signed)
Patient ID: [name], male   DOB: 1935/01/19, 83 y.o.   MRN: [id number]   [employer] PROGRESS NOTE  SOLAN VOSLER IWP:[id number] DOB: August 28, 1934 DOA: 11/23/2017 PCP: System, Pcp Not In  HPI/Subjective: Patient more alert today and answering some questions.  Blood pressure on the lower side with starting Entresto yesterday so this was discontinued.  Some cough and wheeze.  Objective: Vitals:   11/25/17 1206 11/25/17 1209  BP: (!) 85/70 (!) 86/55  Pulse: 76 69  Resp:    Temp:    SpO2:  100%    Filed Weights   11/23/17 1353 11/24/17 0557 11/25/17 0356  Weight: 81.4 kg (179 lb 7.3 oz) 76.4 kg (168 lb 6.4 oz) 75.5 kg (166 lb 8 oz)    ROS: Review of Systems  Unable to perform ROS: Acuity of condition  Respiratory: Negative for shortness of breath.   Cardiovascular: Negative for chest pain.  Gastrointestinal: Negative for abdominal pain.   Exam: Physical Exam  HENT:  Nose: No mucosal edema.  Mouth/Throat: No oropharyngeal exudate or posterior oropharyngeal edema.  Eyes: Pupils are equal, round, and reactive to light. Conjunctivae and lids are normal.  Neck: No JVD present. Carotid bruit is not present. No edema present. No thyroid mass and no thyromegaly present.  Cardiovascular: S1 normal and S2 normal. Exam reveals no gallop.  No murmur heard. Pulses:      Dorsalis pedis pulses are 2+ on the right side, and 2+ on the left side.  Respiratory: No respiratory distress. He has decreased breath sounds in the right lower field. He has wheezes in the right middle field and the right lower field. He has no rhonchi. He has no rales.  GI: Soft. Bowel sounds are normal. There is no tenderness.  Musculoskeletal:       Right ankle: He exhibits no swelling.       Left ankle: He exhibits no swelling.  Lymphadenopathy:    He has no cervical adenopathy.  Neurological: He is alert.  Able to move his extremities on his own.  Skin: Skin is warm. Nails show no clubbing.   Right shin positive for quarter size ulcer with some greenish discoloration.  Psychiatric:  Answers some questions but not all accurate with his answers.      Data Reviewed: Basic Metabolic Panel: Recent Labs  Lab 11/23/17 1056 11/24/17 0215 11/25/17 0558  NA 141 142 140  K 4.7 3.9 3.8  CL 110 107 103  CO2 23 29 26   GLUCOSE 111* 143* 183*  BUN 40* 37* 40*  CREATININE 0.93 1.18 1.04  CALCIUM 8.7* 8.8* 8.3*   Liver Function Tests: Recent Labs  Lab 11/23/17 1056  AST 36  ALT 30  ALKPHOS 241*  BILITOT 1.7*  PROT 6.8  ALBUMIN 3.2*   CBC: Recent Labs  Lab 11/23/17 1056  WBC 9.8  NEUTROABS 8.1*  HGB 13.1  HCT 36.7*  MCV 84.9  PLT 368   Cardiac Enzymes: Recent Labs  Lab 11/23/17 1056 11/23/17 1404 11/23/17 2000 11/24/17 0215  TROPONINI 0.07* 0.11* 0.19* 0.21*   BNP (last 3 results) Recent Labs    10/19/17 0659 11/23/17 1056 11/24/17 0215  BNP 2,483.0* 1,899.0* 2,390.0*     CBG: Recent Labs  Lab 11/24/17 1125 11/24/17 1627 11/24/17 2053 11/25/17 0803 11/25/17 1140  GLUCAP 161* 199* 172* 174* 311*    Recent Results (from the past 240 hour(s))  Culture, blood (routine x 2) Call MD if unable to obtain prior to antibiotics  being given     Status: None (Preliminary result)   Collection Time: 11/23/17  2:04 PM  Result Value Ref Range Status   Specimen Description BLOOD RIGHT ANTECUBITAL  Final   Special Requests   Final    BOTTLES DRAWN AEROBIC AND ANAEROBIC Blood Culture adequate volume   Culture   Final    NO GROWTH 2 DAYS Performed at [facility], [address], [place], [place] [address]    Report Status PENDING  Incomplete  Culture, blood (routine x 2) Call MD if unable to obtain prior to antibiotics being given     Status: None (Preliminary result)   Collection Time: 11/23/17  2:14 PM  Result Value Ref Range Status   Specimen Description BLOOD BLOOD RIGHT WRIST  Final   Special Requests   Final    BOTTLES DRAWN AEROBIC  AND ANAEROBIC Blood Culture adequate volume   Culture   Final    NO GROWTH 2 DAYS Performed at [facility], [address], [place], [place] [address]    Report Status PENDING  Incomplete  MRSA PCR Screening     Status: None   Collection Time: 11/23/17  2:56 PM  Result Value Ref Range Status   MRSA by PCR NEGATIVE NEGATIVE Final    Comment:        The GeneXpert MRSA Assay (FDA approved for NASAL specimens only), is one component of a comprehensive MRSA colonization surveillance program. It is not intended to diagnose MRSA infection nor to guide or monitor treatment for MRSA infections. Performed at [facility], [address], [place], [place] [address]   Aerobic/Anaerobic Culture (surgical/deep wound)     Status: None (Preliminary result)   Collection Time: 11/24/17 10:04 PM  Result Value Ref Range Status   Specimen Description   Final    WOUND RIGHT LEG Performed at [facility], [address] [place], [place] [address]    Special Requests   Final    NONE Performed at [facility], [address] [place], [place] [address]    Gram Stain   Final    RARE WBC PRESENT, PREDOMINANTLY PMN MODERATE GRAM POSITIVE COCCI IN PAIRS IN CLUSTERS RARE GRAM POSITIVE RODS RARE GRAM NEGATIVE RODS RARE BUDDING YEAST SEEN Performed at [facility], [place] [address] [place], [place] [address]    Culture PENDING  Incomplete   Report Status PENDING  Incomplete     Studies: Ct Head Wo Contrast  Result Date: 11/24/2017 CLINICAL DATA:  83 year old male with a history of atrial fibrillation EXAM: CT HEAD WITHOUT CONTRAST TECHNIQUE: Contiguous axial images were obtained from the base of the skull through the vertex without intravenous contrast. COMPARISON:  MR 10/23/2017, CT 10/23/2017 FINDINGS: Brain: No acute intracranial hemorrhage. No midline shift or mass effect. Gray-white differentiation is maintained. Unremarkable appearance of the  ventricles. There is no new hypo dense region that corresponds to known right cerebellar infarction identified on prior MR. Unchanged appearance of the calcifications within the cerebellar parenchyma. Similar appearance of chronic white matter changes. Vascular: Calcifications of the anterior circulation Skull: No acute fracture.  No scalp swelling. Sinuses/Orbits: Unremarkable appearance of sinuses and orbits. Other: None IMPRESSION: CT negative for acute intracranial abnormality. Redemonstration of changes of chronic white matter disease and associated intracranial atherosclerosis. Electronically Signed   By: Corrie Mckusick D.O.   On: 11/24/2017 10:31    Scheduled Meds: . aspirin EC  81 mg Oral Daily  . atorvastatin  80 mg Oral q1800  . azithromycin  250 mg Oral Daily  . collagenase   Topical Daily  . digoxin  0.125 mg Oral Daily  . insulin aspart  0-5 Units Subcutaneous QHS  . insulin aspart  0-5 Units Subcutaneous QHS  . insulin aspart  0-9 Units Subcutaneous TID WC  . insulin glargine  10 Units Subcutaneous QHS  . ipratropium-albuterol  3 mL Nebulization Q6H  . lamoTRIgine  100 mg Oral BID  . metoprolol succinate  25 mg Oral QHS  . oxyCODONE  10 mg Oral Q12H  . potassium chloride SA  20 mEq Oral Daily  . protein supplement shake  11 oz Oral BID BM  . risperiDONE  0.5 mg Oral QHS  . rivaroxaban  20 mg Oral Q supper  . sodium chloride flush  3 mL Intravenous Q12H  . sodium chloride flush  3 mL Intravenous Q12H   Continuous Infusions: . sodium chloride Stopped (11/23/17 1752)    Assessment/Plan:  1. Acute hypoxic respiratory failure.  Last pulse ox was on 4 L 100% on nasal cannula.  We have room to taper this down. 2. Hypotension with starting Entresto yesterday.  Stop Entresto.  Discontinue Cardizem CD.  Lower the dose of Toprol and have holding parameters.  Hold diuresis today. 3. Acute systolic congestive heart failure.  With hypotension not a candidate for Entresto.  Holding  parameters on lower dose metoprolol.  Will need a 36-hour window with washout before starting low-dose ACE inhibitor.  Blood pressure currently too low for Spironolactone.  Hold Lasix today with hypotension. 4. Bilateral pneumonia.  Will send off respiratory PCR.  Procalcitonin negative x2.  Unlikely bacterial pneumonia.  Stop Maxipime.  Will use doxycycline because he does have a wound on his right lower extremity and this will also cover atypicals. 5. Atrial fibrillation.  Patient on digoxin and metoprolol.  Discontinue Cardizem.  Patient on Xarelto. 6. Chronic kidney disease stage III.  Monitor with diuresis. 7. Acute encephalopathy.   seems better today. 8. Sleep apnea.  Order CPAP at night 9. Recent myocardial infarction and stroke as per wife.  Code Status:     Code Status Orders  (From admission, onward)        Start     Ordered   11/23/17 1344  Full code  Continuous     11/23/17 1346    Code Status History    Date Active Date Inactive Code Status Order ID Comments User Context   10/26/2017 1534 10/28/2017 1854 Full Code [id number]  Bettey Costa, MD Inpatient   10/26/2017 1437 10/26/2017 1534 Full Code [id number]  Feliberto Gottron, RN Inpatient   10/24/2017 1152 10/26/2017 1437 DNR [id number]  Bettey Costa, MD Inpatient   10/15/2017 0309 10/24/2017 1152 Full Code [id number]  Arta Silence, MD Inpatient   02/10/2017 1904 02/12/2017 1908 Full Code [id number]  Schnier, Dolores Lory, MD ED   02/10/2017 1638 02/10/2017 1904 Full Code [id number]  Sela Hua, PA-C Outpatient   01/08/2017 1342 01/19/2017 2103 Full Code [id number]  Loletha Grayer, MD ED   12/29/2016 2232 12/30/2016 1634 Full Code [id number]  Lance Coon, MD ED   12/20/2016 1251 12/20/2016 1842 Full Code [id number]  Algernon Huxley, MD Inpatient   10/22/2016 0040 10/24/2016 2058 Full Code [id number]  Lance Coon, MD Inpatient   10/14/2016 1201 10/14/2016 1925 Full Code [id number]  Algernon Huxley, MD Inpatient   08/30/2016 1338 09/06/2016 1941  Full Code [id number]  Epifanio Lesches, MD Inpatient  05/19/2016 0454 05/22/2016 1907 Full Code [id number]  Saundra Shelling, MD Inpatient    Advance Directive Documentation     Most Recent Value  Type of Advance Directive  Living will  Pre-existing out of facility DNR order (yellow form or pink MOST form)  -  "MOST" Form in Place?  -     Family Communication: Spoke with wife at the bedside Disposition Plan: Resident at [facility]  Antibiotics:   change antibiotics to doxycycline  Time spent: 28 minutes  [place]

## 2017-11-25 NOTE — Evaluation (Addendum)
Clinical/Bedside Swallow Evaluation Patient Details  Name: [name] MRN: [id number] Date of Birth: [birth year]  Today's Date: 11/25/2017 Time: SLP Start Time (ACUTE ONLY): 0800 SLP Stop Time (ACUTE ONLY): 0900 SLP Time Calculation (min) (ACUTE ONLY): 60 min  Past Medical History:  Past Medical History:  Diagnosis Date  . Arthritis   . Atrial fibrillation ([place])   . Carcinoma of prostate ([place])   . CHF (congestive heart failure) ([place])   . Chronic kidney disease   . Diabetes mellitus without complication ([place])   . ED (erectile dysfunction)   . Frequent urination   . Hyperlipidemia   . Hypertension   . Moderate mitral insufficiency   . Peripheral vascular disease ([place])   . Pneumonia 04/2016  . Prostate cancer ([place])   . Sleep apnea    OSA--USE C-PAP  . Ulcer of left foot due to type 2 diabetes mellitus ([place])   . Urinary stress incontinence, male    Past Surgical History:  Past Surgical History:  Procedure Laterality Date  . AMPUTATION Left 01/12/2017   Procedure: AMPUTATION BELOW KNEE;  Surgeon: Algernon Huxley, MD;  Location: [facility];  Service: General;  Laterality: Left;  . APLIGRAFT PLACEMENT Left 10/15/2016   Procedure: APLIGRAFT PLACEMENT;  Surgeon: Albertine Patricia, DPM;  Location: [facility];  Service: Podiatry;  Laterality: Left;  necrotic ulcer  . CHOLECYSTECTOMY    . EYE SURGERY Bilateral    Cataract Extraction with IOL  . IRRIGATION AND DEBRIDEMENT FOOT Left 10/15/2016   Procedure: IRRIGATION AND DEBRIDEMENT FOOT-EXCISIONAL DEBRIDEMENT OF SKIN 3RD, 4TH AND 5TH TOES WITH APPLICATION OF APLIGRAFT;  Surgeon: Albertine Patricia, DPM;  Location: [facility];  Service: Podiatry;  Laterality: Left;  necrotic,gangrene  . IRRIGATION AND DEBRIDEMENT FOOT Left 12/09/2016   Procedure: IRRIGATION AND DEBRIDEMENT FOOT-MUSCLE/FASCIA, RESECTION OF FOURTH AND FIFTH METATARSAL NECROTIC BONE;  Surgeon: Albertine Patricia, DPM;  Location: [facility];  Service: Podiatry;  Laterality: Left;  . LOWER  EXTREMITY ANGIOGRAPHY Left 08/16/2016   Procedure: Lower Extremity Angiography;  Surgeon: Algernon Huxley, MD;  Location: [place] [facility];  Service: Cardiovascular;  Laterality: Left;  . LOWER EXTREMITY ANGIOGRAPHY Left 09/01/2016   Procedure: Lower Extremity Angiography;  Surgeon: Algernon Huxley, MD;  Location: [place] [facility];  Service: Cardiovascular;  Laterality: Left;  . LOWER EXTREMITY ANGIOGRAPHY Left 10/14/2016   Procedure: Lower Extremity Angiography;  Surgeon: Algernon Huxley, MD;  Location: [place] [facility];  Service: Cardiovascular;  Laterality: Left;  . LOWER EXTREMITY ANGIOGRAPHY Left 12/20/2016   Procedure: Lower Extremity Angiography;  Surgeon: Algernon Huxley, MD;  Location: [place] [facility];  Service: Cardiovascular;  Laterality: Left;  . LOWER EXTREMITY INTERVENTION  09/01/2016   Procedure: Lower Extremity Intervention;  Surgeon: Algernon Huxley, MD;  Location: [place] [facility];  Service: Cardiovascular;;  . PROSTATE SURGERY     removal  . TONSILLECTOMY     as a child   HPI:  Pt is a 83 y.o. male with a history of chronic atrial fibulation on Xarelto, chronic diastolic CHF, diabetes mellitus, hyperlipidemia, hypertension, s/p L BKA, per family when he was hospitalized ~1 month ago with cardiac issues he also suffered CVA(7 mm subacute right cerebellar infarct).  He's admitted from assisted living facility for shortness of breath/weakness which has been progressively getting worse.  Pt is verbally conversive, mild confusion requiring verbal cues to reattend, clarification of information.    Assessment / Plan / Recommendation Clinical Impression  Pt appears to present  w/ adequate oropharyngeal phase swallow function w/ slight degree of oral phase deficits c/b mastication challenges d/t missing lower dentition; pt prefers the Meats cut and moistened for easier mastication and eating. Pt appears at reduced risk for aspiration when following general aspiration precautions. Pt  required min positioning more upright in bed then he consumed his full breakfast meal feeding himself given tray setup. No overt s/s of aspiration were noted; clear vocal quality and no decline in respiratory status noted during/post trials. Pt exhibited adequate timing of the swallowing and clearing w/ these trials. Oral phase appeared grossly WF during bolus management and oral clearing w/ these consistencies; timely bolus A-P transfer was noted. Pt took his time w/ the mastication of solid textures. Pt denied any difficulty swallowing at home w/ a regular diet; he stated he PREFERRED to use Applesauce, puree when swallowing Pills Whole for easier clearing. Recommend a Regular/Mech Soft diet (meats cut, moist) w/ monitoring of any salads/raw foods, and Thin liquids. Recommend General aspiration precautions AND REST BREAKS during meals to lessen any WOB/SOB/fatigue w/ task; PIlls in Puree w/ NSG. Recommend tray setup and reducing distractions/talking at meals. No further skilled ST services indicated at this time; NSG to reconsult if any decline in status while admitted.  SLP Visit Diagnosis: Dysphagia, unspecified (R13.10)    Aspiration Risk  (reduced following general aspiration precautions)    Diet Recommendation  Regular/mech soft diet w/ Thin liquids; general aspiration precautions. Tray setup while in hospital, reduce distractions.   Medication Administration: Whole meds with puree(he says he does so for easier swallowing/clearing)    Other  Recommendations Recommended Consults: (n/a) Oral Care Recommendations: Oral care BID;Patient independent with oral care;Staff/trained caregiver to provide oral care Other Recommendations: (n/a)   Follow up Recommendations None      Frequency and Duration (n/a)  (n/a)       Prognosis Prognosis for Safe Diet Advancement: Good Barriers to Reach Goals: Cognitive deficits      Swallow Study   General Date of Onset: 11/23/17 HPI: Pt is a 83 y.o.  male with a history of chronic atrial fibulation on Xarelto, chronic diastolic CHF, diabetes mellitus, hyperlipidemia, hypertension, s/p L BKA, per family when he was hospitalized ~1 month ago with cardiac issues he also suffered CVA(7 mm subacute right cerebellar infarct).  He's admitted from assisted living facility for shortness of breath/weakness which has been progressively getting worse.  Pt is verbally conversive, mild confusion requiring verbal cues to reattend, clarification of information.  Type of Study: Bedside Swallow Evaluation Previous Swallow Assessment: none reported Diet Prior to this Study: Regular;Thin liquids Temperature Spikes Noted: No(wbc 9.8) Respiratory Status: Nasal cannula(4 liters) History of Recent Intubation: No Behavior/Cognition: Alert;Cooperative;Pleasant mood;Distractible;Requires cueing(min) Oral Cavity Assessment: Within Functional Limits;Dry(min) Oral Care Completed by SLP: Recent completion by staff Oral Cavity - Dentition: Missing dentition;Dentures, top(on bottom) Vision: Functional for self-feeding Self-Feeding Abilities: Able to feed self;Needs set up;Needs assist Patient Positioning: Upright in bed Baseline Vocal Quality: Normal Volitional Cough: Strong Volitional Swallow: Able to elicit    Oral/Motor/Sensory Function Overall Oral Motor/Sensory Function: Within functional limits(grossly WFL for all strength/ROM movements)   Ice Chips Ice chips: Within functional limits Presentation: Spoon(fed; 2 trials)   Thin Liquid Thin Liquid: Within functional limits Presentation: Cup;Self Fed;Straw(~8 ozs total)    Nectar Thick Nectar Thick Liquid: Not tested   Honey Thick Honey Thick Liquid: Not tested   Puree Puree: Within functional limits Presentation: Self Fed;Spoon(3-4 ozs)   Solid   GO  Solid: Within functional limits(soft solids d/t dentition status) Presentation: Self Fed;Spoon(2 pancakes, peaches)         Orinda Kenner, MS,  CCC-SLP Watson,Katherine 11/25/2017,4:49 PM

## 2017-11-25 NOTE — Progress Notes (Signed)
Per [facility] admissions coordinator at [facility] she will start [facility] authorization today.   [employer], LCSW [phone]

## 2017-11-26 LAB — BASIC METABOLIC PANEL
ANION GAP: 10 (ref 5–15)
BUN: 44 mg/dL — ABNORMAL HIGH (ref 8–23)
CALCIUM: 8 mg/dL — AB (ref 8.9–10.3)
CO2: 27 mmol/L (ref 22–32)
Chloride: 101 mmol/L (ref 98–111)
Creatinine, Ser: 1.13 mg/dL (ref 0.61–1.24)
GFR, EST NON AFRICAN AMERICAN: 59 mL/min — AB (ref >60)
Glucose, Bld: 166 mg/dL — ABNORMAL HIGH (ref 70–99)
Potassium: 3.8 mmol/L (ref 3.5–5.1)
SODIUM: 138 mmol/L (ref 135–145)

## 2017-11-26 LAB — GLUCOSE, CAPILLARY
GLUCOSE-CAPILLARY: 127 mg/dL — AB (ref 70–99)
GLUCOSE-CAPILLARY: 190 mg/dL — AB (ref 70–99)
Glucose-Capillary: 213 mg/dL — ABNORMAL HIGH (ref 70–99)
Glucose-Capillary: 194 mg/dL — ABNORMAL HIGH (ref 70–99)

## 2017-11-26 LAB — DIGOXIN LEVEL: DIGOXIN LVL: 0.5 ng/mL — AB (ref 0.8–2.0)

## 2017-11-26 LAB — RESPIRATORY PANEL BY PCR
Adenovirus: NOT DETECTED
BORDETELLA PERTUSSIS-RVPCR: NOT DETECTED
CHLAMYDOPHILA PNEUMONIAE-RVPPCR: NOT DETECTED
CORONAVIRUS HKU1-RVPPCR: NOT DETECTED
Coronavirus 229E: NOT DETECTED
Coronavirus NL63: NOT DETECTED
Coronavirus OC43: NOT DETECTED
INFLUENZA A-RVPPCR: NOT DETECTED
Influenza B: NOT DETECTED
MYCOPLASMA PNEUMONIAE-RVPPCR: NOT DETECTED
Metapneumovirus: NOT DETECTED
PARAINFLUENZA VIRUS 4-RVPPCR: NOT DETECTED
Parainfluenza Virus 1: NOT DETECTED
Parainfluenza Virus 2: NOT DETECTED
Parainfluenza Virus 3: NOT DETECTED
Respiratory Syncytial Virus: NOT DETECTED
Rhinovirus / Enterovirus: NOT DETECTED

## 2017-11-26 LAB — PROCALCITONIN

## 2017-11-26 MED ORDER — SENNOSIDES-DOCUSATE SODIUM 8.6-50 MG PO TABS
2.0000 | ORAL_TABLET | Freq: Two times a day (BID) | ORAL | Status: DC
  Administered 2017-11-26 – 2017-11-28 (×4): 2 via ORAL
  Filled 2017-11-26 (×4): qty 2

## 2017-11-26 MED ORDER — METOPROLOL TARTRATE 25 MG PO TABS
12.5000 mg | ORAL_TABLET | Freq: Three times a day (TID) | ORAL | Status: DC
  Administered 2017-11-26 – 2017-11-27 (×4): 12.5 mg via ORAL
  Filled 2017-11-26 (×4): qty 1

## 2017-11-26 MED ORDER — BISACODYL 5 MG PO TBEC
10.0000 mg | DELAYED_RELEASE_TABLET | Freq: Every day | ORAL | Status: DC
  Administered 2017-11-26 – 2017-11-27 (×2): 10 mg via ORAL
  Filled 2017-11-26 (×2): qty 2

## 2017-11-26 MED ORDER — DIGOXIN 0.25 MG/ML IJ SOLN
0.2500 mg | Freq: Once | INTRAMUSCULAR | Status: AC
  Administered 2017-11-26: 0.25 mg via INTRAVENOUS
  Filled 2017-11-26: qty 2

## 2017-11-26 NOTE — Progress Notes (Signed)
PT Cancellation Note  Patient Details Name: [name] MRN: [id number] DOB: 11/14/1934   Cancelled Treatment:    Reason Eval/Treat Not Completed: Medical issues which prohibited therapy   Chart reviewed.  BP remains low. Will defer mobility at this time.  Will continue as appropriate.   Chesley Noon 11/26/2017, 11:36 AM

## 2017-11-26 NOTE — Progress Notes (Signed)
Patient ID: [name], male   DOB: 1934-06-19, 83 y.o.   MRN: [id number]   [facility] [employer] PROGRESS NOTE  HOSIE SHARMAN AYT:[id number] DOB: Sep 03, 1934 DOA: 11/23/2017 PCP: System, Pcp Not In  HPI/Subjective: Patient feels okay.  Currently off oxygen.  Heart rate a little fast and blood pressure still low.  Objective: Vitals:   11/26/17 1229 11/26/17 1340  BP: (!) 97/57   Pulse: (!) 107   Resp:    Temp:    SpO2: 94% 94%    Filed Weights   11/24/17 0557 11/25/17 0356 11/26/17 0359  Weight: 76.4 kg (168 lb 6.4 oz) 75.5 kg (166 lb 8 oz) 75.4 kg (166 lb 4.8 oz)    ROS: Review of Systems  Constitutional: Negative for chills and fever.  Eyes: Negative for blurred vision.  Respiratory: Negative for cough and shortness of breath.   Cardiovascular: Negative for chest pain.  Gastrointestinal: Negative for abdominal pain, constipation, diarrhea, nausea and vomiting.  Genitourinary: Negative for dysuria.  Musculoskeletal: Negative for joint pain and neck pain.  Neurological: Negative for dizziness and headaches.   Exam: Physical Exam  HENT:  Nose: No mucosal edema.  Mouth/Throat: No oropharyngeal exudate or posterior oropharyngeal edema.  Eyes: Pupils are equal, round, and reactive to light. Conjunctivae and lids are normal.  Neck: No JVD present. Carotid bruit is not present. No edema present. No thyroid mass and no thyromegaly present.  Cardiovascular: S1 normal and S2 normal. Exam reveals no gallop.  No murmur heard. Pulses:      Dorsalis pedis pulses are 2+ on the right side, and 2+ on the left side.  Respiratory: No respiratory distress. He has decreased breath sounds in the right lower field and the left lower field. He has no wheezes. He has no rhonchi. He has no rales.  GI: Soft. Bowel sounds are normal. There is no tenderness.  Musculoskeletal:       Right ankle: He exhibits no swelling.       Left ankle: He exhibits no swelling.  Lymphadenopathy:    He has  no cervical adenopathy.  Neurological: He is alert. No sensory deficit.  Skin: Skin is warm. Nails show no clubbing.  Right shin positive for quarter size ulcer with some greenish discoloration.  Psychiatric: He has a normal mood and affect.      Data Reviewed: Basic Metabolic Panel: Recent Labs  Lab 11/23/17 1056 11/24/17 0215 11/25/17 0558 11/26/17 0400  NA 141 142 140 138  K 4.7 3.9 3.8 3.8  CL 110 107 103 101  CO2 23 29 26 27   GLUCOSE 111* 143* 183* 166*  BUN 40* 37* 40* 44*  CREATININE 0.93 1.18 1.04 1.13  CALCIUM 8.7* 8.8* 8.3* 8.0*   Liver Function Tests: Recent Labs  Lab 11/23/17 1056  AST 36  ALT 30  ALKPHOS 241*  BILITOT 1.7*  PROT 6.8  ALBUMIN 3.2*   CBC: Recent Labs  Lab 11/23/17 1056  WBC 9.8  NEUTROABS 8.1*  HGB 13.1  HCT 36.7*  MCV 84.9  PLT 368   Cardiac Enzymes: Recent Labs  Lab 11/23/17 1056 11/23/17 1404 11/23/17 2000 11/24/17 0215  TROPONINI 0.07* 0.11* 0.19* 0.21*   BNP (last 3 results) Recent Labs    10/19/17 0659 11/23/17 1056 11/24/17 0215  BNP 2,483.0* 1,899.0* 2,390.0*     CBG: Recent Labs  Lab 11/25/17 1140 11/25/17 1644 11/25/17 2107 11/26/17 0744 11/26/17 1136  GLUCAP 311* 133* 158* 127* 213*    Recent Results (from  the past 240 hour(s))  Culture, blood (routine x 2) Call MD if unable to obtain prior to antibiotics being given     Status: None (Preliminary result)   Collection Time: 11/23/17  2:04 PM  Result Value Ref Range Status   Specimen Description BLOOD RIGHT ANTECUBITAL  Final   Special Requests   Final    BOTTLES DRAWN AEROBIC AND ANAEROBIC Blood Culture adequate volume   Culture   Final    NO GROWTH 3 DAYS Performed at [facility], [address], [place], [place] [address]    Report Status PENDING  Incomplete  Culture, blood (routine x 2) Call MD if unable to obtain prior to antibiotics being given     Status: None (Preliminary result)   Collection Time: 11/23/17  2:14 PM   Result Value Ref Range Status   Specimen Description BLOOD BLOOD RIGHT WRIST  Final   Special Requests   Final    BOTTLES DRAWN AEROBIC AND ANAEROBIC Blood Culture adequate volume   Culture   Final    NO GROWTH 3 DAYS Performed at [facility], [address] [place], [place] [address]    Report Status PENDING  Incomplete  MRSA PCR Screening     Status: None   Collection Time: 11/23/17  2:56 PM  Result Value Ref Range Status   MRSA by PCR NEGATIVE NEGATIVE Final    Comment:        The GeneXpert MRSA Assay (FDA approved for NASAL specimens only), is one component of a comprehensive MRSA colonization surveillance program. It is not intended to diagnose MRSA infection nor to guide or monitor treatment for MRSA infections. Performed at [facility], [address], [place], [place] [address]   Aerobic/Anaerobic Culture (surgical/deep wound)     Status: None (Preliminary result)   Collection Time: 11/24/17 10:04 PM  Result Value Ref Range Status   Specimen Description   Final    WOUND RIGHT LEG Performed at [facility], [address] [place], [place] [address]    Special Requests   Final    NONE Performed at [facility], [address] [place] [place], [place] [address]    Gram Stain   Final    RARE WBC PRESENT, PREDOMINANTLY PMN MODERATE GRAM POSITIVE COCCI IN PAIRS IN CLUSTERS RARE GRAM POSITIVE RODS RARE GRAM NEGATIVE RODS RARE BUDDING YEAST SEEN    Culture   Final    ABUNDANT STAPHYLOCOCCUS AUREUS SUSCEPTIBILITIES TO FOLLOW WITHIN MIXED CULTURE Performed at [facility], [place] [address] [place], [place] [address]    Report Status PENDING  Incomplete  Respiratory Panel by PCR     Status: None   Collection Time: 11/25/17  7:48 PM  Result Value Ref Range Status   Adenovirus NOT DETECTED NOT DETECTED Final   Coronavirus 229E NOT DETECTED NOT DETECTED Final   Coronavirus HKU1 NOT DETECTED NOT DETECTED Final   Coronavirus  NL63 NOT DETECTED NOT DETECTED Final   Coronavirus OC43 NOT DETECTED NOT DETECTED Final   Metapneumovirus NOT DETECTED NOT DETECTED Final   Rhinovirus / Enterovirus NOT DETECTED NOT DETECTED Final   Influenza A NOT DETECTED NOT DETECTED Final   Influenza B NOT DETECTED NOT DETECTED Final   Parainfluenza Virus 1 NOT DETECTED NOT DETECTED Final   Parainfluenza Virus 2 NOT DETECTED NOT DETECTED Final   Parainfluenza Virus 3 NOT DETECTED NOT DETECTED Final   Parainfluenza Virus 4 NOT DETECTED NOT DETECTED Final   Respiratory Syncytial  Virus NOT DETECTED NOT DETECTED Final   Bordetella pertussis NOT DETECTED NOT DETECTED Final   Chlamydophila pneumoniae NOT DETECTED NOT DETECTED Final   Mycoplasma pneumoniae NOT DETECTED NOT DETECTED Final    Comment: Performed at [facility], [place] [address] [place],  [address]     Studies: No results found.  Scheduled Meds: . aspirin EC  81 mg Oral Daily  . atorvastatin  80 mg Oral q1800  . budesonide (PULMICORT) nebulizer solution  0.5 mg Nebulization BID  . collagenase   Topical Daily  . digoxin  0.125 mg Oral Daily  . doxycycline  100 mg Oral BID WC  . insulin aspart  0-5 Units Subcutaneous QHS  . insulin aspart  0-9 Units Subcutaneous TID WC  . insulin glargine  10 Units Subcutaneous QHS  . ipratropium-albuterol  3 mL Nebulization TID  . lamoTRIgine  100 mg Oral BID  . metoprolol tartrate  12.5 mg Oral TID  . oxyCODONE  10 mg Oral Q12H  . potassium chloride SA  20 mEq Oral Daily  . protein supplement shake  11 oz Oral BID BM  . risperiDONE  0.5 mg Oral QHS  . rivaroxaban  20 mg Oral Q supper  . sodium chloride flush  3 mL Intravenous Q12H  . sodium chloride flush  3 mL Intravenous Q12H   Continuous Infusions: . sodium chloride Stopped (11/23/17 1752)    Assessment/Plan:  1. Acute hypoxic respiratory failure.  Patient on room air today. 2. Hypotension and atrial fibrillation with rapid ventricular response.  Since digoxin  level is low I will give IV digoxin.  Restart metoprolol 12.5 mg 3 times daily.  Increase if able to do so.  Patient on Xarelto for anticoagulation. 3. Acute systolic congestive heart failure.  With hypotension not a candidate for Entresto.   restarted metoprolol today. 4. Bilateral pneumonia, wound with infection right lower extremity.  Unlikely bacterial pneumonia with procalcitonin negative. 5. Chronic kidney disease stage III.  6. Acute encephalopathy.  This has improved.  7. Sleep apnea.  Order CPAP at night 8. Recent myocardial infarction and stroke as per wife.  Code Status:     Code Status Orders  (From admission, onward)        Start     Ordered   11/23/17 1344  Full code  Continuous     11/23/17 1346    Code Status History    Date Active Date Inactive Code Status Order ID Comments User Context   10/26/2017 1534 10/28/2017 1854 Full Code [id number]  Bettey Costa, MD Inpatient   10/26/2017 1437 10/26/2017 1534 Full Code [id number]  Feliberto Gottron, RN Inpatient   10/24/2017 1152 10/26/2017 1437 DNR [id number]  Bettey Costa, MD Inpatient   10/15/2017 0309 10/24/2017 1152 Full Code [id number]  Arta Silence, MD Inpatient   02/10/2017 1904 02/12/2017 1908 Full Code [id number]  Schnier, Dolores Lory, MD ED   02/10/2017 1638 02/10/2017 1904 Full Code [id number]  Sela Hua, PA-C Outpatient   01/08/2017 1342 01/19/2017 2103 Full Code [id number]  Loletha Grayer, MD ED   12/29/2016 2232 12/30/2016 1634 Full Code [id number]  Lance Coon, MD ED   12/20/2016 1251 12/20/2016 1842 Full Code [id number]  Algernon Huxley, MD Inpatient   10/22/2016 0040 10/24/2016 2058 Full Code [id number]  Lance Coon, MD Inpatient   10/14/2016 1201 10/14/2016 1925 Full Code [id number]  Algernon Huxley, MD Inpatient   08/30/2016 1338 09/06/2016 1941 Full Code [id number]  Epifanio Lesches, MD Inpatient  05/19/2016 0454 05/22/2016 1907 Full Code [id number]  Saundra Shelling, MD Inpatient    Advance Directive Documentation     Most  Recent Value  Type of Advance Directive  Living will  Pre-existing out of facility DNR order (yellow form or pink MOST form)  -  "MOST" Form in Place?  -     Family Communication: Left message for wife on the phone. Disposition Plan: Resident at [facility]  Antibiotics:   doxycycline  Time spent: 27 minutes  [place]

## 2017-11-26 NOTE — Progress Notes (Signed)
Text paged prime doctor if patient can have bowel regimen, last BM was the 1st, patient on oxycodone for pain. Patient did received dulcolax tablet this morning. Dr. Manuella Ghazi order for senna and dulcolax as scheduled. RN will continue to monitor.

## 2017-11-26 NOTE — Progress Notes (Signed)
  Speech Language Pathology Treatment: Dysphagia  Patient Details Name: [name] MRN: [id number] DOB: 11-14-1934 Today's Date: 11/26/2017 Time: 3545-6256 SLP Time Calculation (min) (ACUTE ONLY): 40 min  Assessment / Plan / Recommendation Clinical Impression  Pt seen this morning as he appeared to have difficulty w/ the initiation of the task of meal preparation and setup after tray was dropped off by [employer]. NSG made aware of pt's need for support at meals to follow through w/ task for setup and eating/drinking at meals. Pt stated he felt ok; chart reviewed.  Pt appears to continue to present w/ adequate oropharyngeal phase swallow function w/ slight degree of oral phase deficits c/b mastication challenges d/t missing lower dentition. Pt needs his foods cut and moistened for easier mastication and eating. Pt appears at reduced risk for aspiration when following general aspiration precautions. Pt required min assistance w/ positioning more upright in bed for eating his breakfast meal; fed himself given FULL tray setup. No overt s/s of aspiration were noted; clear vocal quality and no decline in respiratory status noted during/post trials. Oral phase appeared grossly WF during bolus management and oral clearing w/ these consistencies of foods. Pt took his time w/ the mastication of solid textures. Pt stated he PREFERRED to use Applesauce, puree when swallowing Pills Whole for easier clearing. Recommend a Regular/Mech Soft diet (meats cut, moist) w/ monitoring of, avoiding, any salads/raw foods, and Thin liquids. Recommend General aspiration precautions AND REST BREAKS during meals to lessen any WOB/SOB/fatigue w/ task; PIlls in Puree w/ NSG. Recommend tray setup and reducing distractions/talking at meals. No further skilled ST services indicated at this time. NSG updated, agreed.    HPI HPI: Pt is a 83 y.o. male with a history of chronic atrial fibulation on Xarelto, chronic diastolic CHF,  diabetes mellitus, hyperlipidemia, hypertension, s/p L BKA, per family when he was hospitalized ~1 month ago with cardiac issues he also suffered CVA(7 mm subacute right cerebellar infarct).  He's admitted from assisted living facility for shortness of breath/weakness which has been progressively getting worse.  Pt is verbally conversive, mild confusion requiring verbal cues to reattend, clarification of information.       SLP Plan  All goals met(education given)       Recommendations  Diet recommendations: Regular;Dysphagia 3 (mechanical soft);Thin liquid Liquids provided via: Cup;Straw(monitor use of straws) Medication Administration: Whole meds with puree(for safer, easier swallowing) Supervision: Patient able to self feed;Intermittent supervision to cue for compensatory strategies Compensations: Minimize environmental distractions;Slow rate;Small sips/bites;Lingual sweep for clearance of pocketing;Multiple dry swallows after each bite/sip;Follow solids with liquid Postural Changes and/or Swallow Maneuvers: Seated upright 90 degrees;Upright 30-60 min after meal                General recommendations: (Dietician f/u as needed) Oral Care Recommendations: Oral care BID;Patient independent with oral care;Staff/trained caregiver to provide oral care Follow up Recommendations: None SLP Visit Diagnosis: Dysphagia, unspecified (R13.10) Plan: All goals met(education given)       GO                Orinda Kenner, MS, CCC-SLP Watson,Katherine 11/26/2017, 11:40 AM

## 2017-11-26 NOTE — Progress Notes (Signed)
Patient has refused cpap ?

## 2017-11-27 LAB — BASIC METABOLIC PANEL
Anion gap: 7 (ref 5–15)
BUN: 39 mg/dL — AB (ref 8–23)
CALCIUM: 8.2 mg/dL — AB (ref 8.9–10.3)
CO2: 27 mmol/L (ref 22–32)
Chloride: 104 mmol/L (ref 98–111)
Creatinine, Ser: 0.89 mg/dL (ref 0.61–1.24)
GFR calc Af Amer: >60 mL/min (ref >60)
Glucose, Bld: 177 mg/dL — ABNORMAL HIGH (ref 70–99)
Potassium: 3.6 mmol/L (ref 3.5–5.1)
Sodium: 138 mmol/L (ref 135–145)

## 2017-11-27 LAB — GLUCOSE, CAPILLARY
Glucose-Capillary: 128 mg/dL — ABNORMAL HIGH (ref 70–99)
Glucose-Capillary: 238 mg/dL — ABNORMAL HIGH (ref 70–99)
Glucose-Capillary: 130 mg/dL — ABNORMAL HIGH (ref 70–99)
Glucose-Capillary: 148 mg/dL — ABNORMAL HIGH (ref 70–99)

## 2017-11-27 MED ORDER — IPRATROPIUM-ALBUTEROL 0.5-2.5 (3) MG/3ML IN SOLN: 3.0000 mL | Freq: Four times a day (QID) | RESPIRATORY_TRACT | Status: DC | PRN

## 2017-11-27 MED ORDER — METOPROLOL TARTRATE 25 MG PO TABS
25.0000 mg | ORAL_TABLET | Freq: Three times a day (TID) | ORAL | Status: DC
  Administered 2017-11-27 – 2017-11-28 (×4): 25 mg via ORAL
  Filled 2017-11-27 (×4): qty 1

## 2017-11-27 MED ORDER — POLYETHYLENE GLYCOL 3350 17 G PO PACK
17.0000 g | PACK | Freq: Every day | ORAL | Status: DC
  Administered 2017-11-27 – 2017-11-28 (×2): 17 g via ORAL
  Filled 2017-11-27 (×2): qty 1

## 2017-11-27 MED ORDER — DILTIAZEM HCL ER COATED BEADS 120 MG PO CP24
120.0000 mg | ORAL_CAPSULE | Freq: Every day | ORAL | Status: DC
  Administered 2017-11-27 – 2017-11-28 (×2): 120 mg via ORAL
  Filled 2017-11-27 (×2): qty 1

## 2017-11-27 NOTE — Progress Notes (Signed)
Patient ID: [name], male   DOB: [birth year], 83 y.o.   MRN: [id number]   [facility] PROGRESS NOTE  [name] DOB: 09-02-1934 DOA: 11/23/2017 PCP: System, Pcp Not In  HPI/Subjective: Patient feels okay.  Condom catheter placed for urinary incontinence and nurse stated starting to get some skin breakdown on the buttock.  Heart rate still fast but blood pressure coming up.  Objective: Vitals:   11/27/17 0835 11/27/17 0928  BP:    Pulse: (!) 109   Resp:    Temp:    SpO2:  95%    Filed Weights   11/25/17 0356 11/26/17 0359 11/27/17 0346  Weight: 75.5 kg (166 lb 8 oz) 75.4 kg (166 lb 4.8 oz) 75.3 kg (166 lb 1.6 oz)    ROS: Review of Systems  Constitutional: Negative for chills and fever.  Eyes: Negative for blurred vision.  Respiratory: Negative for cough and shortness of breath.   Cardiovascular: Negative for chest pain.  Gastrointestinal: Negative for abdominal pain, constipation, diarrhea, nausea and vomiting.  Genitourinary: Negative for dysuria.  Musculoskeletal: Negative for joint pain and neck pain.  Neurological: Negative for dizziness and headaches.   Exam: Physical Exam  HENT:  Nose: No mucosal edema.  Mouth/Throat: No oropharyngeal exudate or posterior oropharyngeal edema.  Eyes: Pupils are equal, round, and reactive to light. Conjunctivae and lids are normal.  Neck: No JVD present. Carotid bruit is not present. No edema present. No thyroid mass and no thyromegaly present.  Cardiovascular: S1 normal and S2 normal. An irregularly irregular rhythm present. Tachycardia present. Exam reveals no gallop.  No murmur heard. Pulses:      Dorsalis pedis pulses are 2+ on the right side, and 2+ on the left side.  Respiratory: No respiratory distress. He has decreased breath sounds in the right lower field and the left lower field. He has no wheezes. He has no rhonchi. He has no rales.  GI: Soft. Bowel sounds are normal. There is no  tenderness.  Musculoskeletal:       Right ankle: He exhibits no swelling.       Left ankle: He exhibits no swelling.  Lymphadenopathy:    He has no cervical adenopathy.  Neurological: He is alert. No sensory deficit.  Skin: Skin is warm. Nails show no clubbing.  Right shin positive for quarter size ulcer with some greenish discoloration.  Psychiatric: He has a normal mood and affect.      Data Reviewed: Basic Metabolic Panel: Recent Labs  Lab 11/23/17 1056 11/24/17 0215 11/25/17 0558 11/26/17 0400 11/27/17 0425  NA 141 142 140 138 138  K 4.7 3.9 3.8 3.8 3.6  CL 110 107 103 101 104  CO2 23 29 26 27 27   GLUCOSE 111* 143* 183* 166* 177*  BUN 40* 37* 40* 44* 39*  CREATININE 0.93 1.18 1.04 1.13 0.89  CALCIUM 8.7* 8.8* 8.3* 8.0* 8.2*   Liver Function Tests: Recent Labs  Lab 11/23/17 1056  AST 36  ALT 30  ALKPHOS 241*  BILITOT 1.7*  PROT 6.8  ALBUMIN 3.2*   CBC: Recent Labs  Lab 11/23/17 1056  WBC 9.8  NEUTROABS 8.1*  HGB 13.1  HCT 36.7*  MCV 84.9  PLT 368   Cardiac Enzymes: Recent Labs  Lab 11/23/17 1056 11/23/17 1404 11/23/17 2000 11/24/17 0215  TROPONINI 0.07* 0.11* 0.19* 0.21*   BNP (last 3 results) Recent Labs    10/19/17 0659 11/23/17 1056 11/24/17 0215  BNP 2,483.0* 1,899.0* 2,390.0*  CBG: Recent Labs  Lab 11/26/17 1136 11/26/17 1639 11/26/17 2113 11/27/17 0807 11/27/17 1155  GLUCAP 213* 190* 194* 128* 238*    Recent Results (from the past 240 hour(s))  Culture, blood (routine x 2) Call MD if unable to obtain prior to antibiotics being given     Status: None (Preliminary result)   Collection Time: 11/23/17  2:04 PM  Result Value Ref Range Status   Specimen Description BLOOD RIGHT ANTECUBITAL  Final   Special Requests   Final    BOTTLES DRAWN AEROBIC AND ANAEROBIC Blood Culture adequate volume   Culture   Final    NO GROWTH 4 DAYS Performed at [facility], [address] [place], [place] [address]    Report  Status PENDING  Incomplete  Culture, blood (routine x 2) Call MD if unable to obtain prior to antibiotics being given     Status: None (Preliminary result)   Collection Time: 11/23/17  2:14 PM  Result Value Ref Range Status   Specimen Description BLOOD BLOOD RIGHT WRIST  Final   Special Requests   Final    BOTTLES DRAWN AEROBIC AND ANAEROBIC Blood Culture adequate volume   Culture   Final    NO GROWTH 4 DAYS Performed at [facility], [address], [place], [place] [address]    Report Status PENDING  Incomplete  MRSA PCR Screening     Status: None   Collection Time: 11/23/17  2:56 PM  Result Value Ref Range Status   MRSA by PCR NEGATIVE NEGATIVE Final    Comment:        The GeneXpert MRSA Assay (FDA approved for NASAL specimens only), is one component of a comprehensive MRSA colonization surveillance program. It is not intended to diagnose MRSA infection nor to guide or monitor treatment for MRSA infections. Performed at [facility], [address], [place], [place] [address]   Aerobic/Anaerobic Culture (surgical/deep wound)     Status: None (Preliminary result)   Collection Time: 11/24/17 10:04 PM  Result Value Ref Range Status   Specimen Description   Final    WOUND RIGHT LEG Performed at [facility], [address], [place], [place] [address]    Special Requests   Final    NONE Performed at [facility], [address] [place], [place] [address]    Gram Stain   Final    RARE WBC PRESENT, PREDOMINANTLY PMN MODERATE GRAM POSITIVE COCCI IN PAIRS IN CLUSTERS RARE GRAM POSITIVE RODS RARE GRAM NEGATIVE RODS RARE BUDDING YEAST SEEN Performed at [facility], [place] [address], [place], [place] [address]    Culture   Final    ABUNDANT METHICILLIN RESISTANT STAPHYLOCOCCUS AUREUS WITHIN MIXED CULTURE NO ANAEROBES ISOLATED; CULTURE IN PROGRESS FOR 5 DAYS    Report Status PENDING  Incomplete   Organism ID, Bacteria  METHICILLIN RESISTANT STAPHYLOCOCCUS AUREUS  Final      Susceptibility   Methicillin resistant staphylococcus aureus - MIC*    CIPROFLOXACIN >=8 RESISTANT Resistant     ERYTHROMYCIN >=8 RESISTANT Resistant     GENTAMICIN <=0.5 SENSITIVE Sensitive     OXACILLIN >=4 RESISTANT Resistant     TETRACYCLINE <=1 SENSITIVE Sensitive     VANCOMYCIN <=0.5 SENSITIVE Sensitive     TRIMETH/SULFA >=320 RESISTANT Resistant     CLINDAMYCIN <=0.25 SENSITIVE Sensitive     RIFAMPIN <=0.5 SENSITIVE Sensitive     Inducible Clindamycin NEGATIVE Sensitive     * ABUNDANT METHICILLIN RESISTANT STAPHYLOCOCCUS  AUREUS  Respiratory Panel by PCR     Status: None   Collection Time: 11/25/17  7:48 PM  Result Value Ref Range Status   Adenovirus NOT DETECTED NOT DETECTED Final   Coronavirus 229E NOT DETECTED NOT DETECTED Final   Coronavirus HKU1 NOT DETECTED NOT DETECTED Final   Coronavirus NL63 NOT DETECTED NOT DETECTED Final   Coronavirus OC43 NOT DETECTED NOT DETECTED Final   Metapneumovirus NOT DETECTED NOT DETECTED Final   Rhinovirus / Enterovirus NOT DETECTED NOT DETECTED Final   Influenza A NOT DETECTED NOT DETECTED Final   Influenza B NOT DETECTED NOT DETECTED Final   Parainfluenza Virus 1 NOT DETECTED NOT DETECTED Final   Parainfluenza Virus 2 NOT DETECTED NOT DETECTED Final   Parainfluenza Virus 3 NOT DETECTED NOT DETECTED Final   Parainfluenza Virus 4 NOT DETECTED NOT DETECTED Final   Respiratory Syncytial Virus NOT DETECTED NOT DETECTED Final   Bordetella pertussis NOT DETECTED NOT DETECTED Final   Chlamydophila pneumoniae NOT DETECTED NOT DETECTED Final   Mycoplasma pneumoniae NOT DETECTED NOT DETECTED Final    Comment: Performed at [facility], [place] [address], [place], [place] [address]     Studies: No results found.  Scheduled Meds: . aspirin EC  81 mg Oral Daily  . atorvastatin  80 mg Oral q1800  . bisacodyl  10 mg Oral QHS  . collagenase   Topical Daily  . digoxin  0.125 mg Oral  Daily  . diltiazem  120 mg Oral Daily  . doxycycline  100 mg Oral BID WC  . insulin aspart  0-5 Units Subcutaneous QHS  . insulin aspart  0-9 Units Subcutaneous TID WC  . insulin glargine  10 Units Subcutaneous QHS  . lamoTRIgine  100 mg Oral BID  . metoprolol tartrate  25 mg Oral TID  . oxyCODONE  10 mg Oral Q12H  . polyethylene glycol  17 g Oral Daily  . potassium chloride SA  20 mEq Oral Daily  . protein supplement shake  11 oz Oral BID BM  . risperiDONE  0.5 mg Oral QHS  . rivaroxaban  20 mg Oral Q supper  . senna-docusate  2 tablet Oral BID  . sodium chloride flush  3 mL Intravenous Q12H   Continuous Infusions: . sodium chloride Stopped (11/23/17 1752)    Assessment/Plan:  1. Atrial fibrillation with rapid ventricular response, relative hypotension.  Since blood pressure better today increase metoprolol to 25 mg 3 times daily and Cardizem CD.  Patient already on digoxin.  Patient on Xarelto for anticoagulation. 2. MRSA in wound.  On doxycycline 3. Acute hypoxic respiratory failure.  This is improved.  Patient breathing comfortably on room air. 4. Acute systolic congestive heart failure.  With hypotension not a candidate for Entresto.   Increase metoprolol.  Depending on the patient's blood pressure may have to restart ACE inhibitor low-dose as outpatient. 5. Chronic kidney disease stage II. 6. Acute encephalopathy.  This has improved.  7. Sleep apnea.  Refused CPAP 8. Recent myocardial infarction and stroke as per wife.  Code Status:     Code Status Orders  (From admission, onward)        Start     Ordered   11/23/17 1344  Full code  Continuous     11/23/17 1346    Code Status History    Date Active Date Inactive Code Status Order ID Comments User Context   10/26/2017 1534 10/28/2017 1854 Full Code [id number]  Bettey Costa, MD Inpatient  10/26/2017 1437 10/26/2017 1534 Full Code [id number]  Feliberto Gottron, RN Inpatient   10/24/2017 1152 10/26/2017 1437 DNR [id number]   Bettey Costa, MD Inpatient   10/15/2017 0309 10/24/2017 1152 Full Code [id number]  Arta Silence, MD Inpatient   02/10/2017 1904 02/12/2017 1908 Full Code [id number]  Delana Meyer, Dolores Lory, MD ED   02/10/2017 1638 02/10/2017 1904 Full Code [id number]  Sela Hua, PA-C Outpatient   01/08/2017 1342 01/19/2017 2103 Full Code [id number]  Loletha Grayer, MD ED   12/29/2016 2232 12/30/2016 1634 Full Code [id number]  Lance Coon, MD ED   12/20/2016 1251 12/20/2016 1842 Full Code [id number]  Algernon Huxley, MD Inpatient   10/22/2016 0040 10/24/2016 2058 Full Code [id number]  Lance Coon, MD Inpatient   10/14/2016 1201 10/14/2016 1925 Full Code [id number]  Algernon Huxley, MD Inpatient   08/30/2016 1338 09/06/2016 1941 Full Code [id number]  Epifanio Lesches, MD Inpatient   05/19/2016 0454 05/22/2016 1907 Full Code [id number]  Saundra Shelling, MD Inpatient    Advance Directive Documentation     Most Recent Value  Type of Advance Directive  Living will  Pre-existing out of facility DNR order (yellow form or pink MOST form)  -  "MOST" Form in Place?  -     Family Communication: Tried to call wife on the phone but phone was busy at the home number and the mobile number voice mailbox was full. Disposition Plan: Resident at [facility]  Antibiotics:   doxycycline  Time spent: 26 minutes  [place]

## 2017-11-28 LAB — BASIC METABOLIC PANEL
ANION GAP: 7 (ref 5–15)
BUN: 36 mg/dL — ABNORMAL HIGH (ref 8–23)
CO2: 26 mmol/L (ref 22–32)
Calcium: 8 mg/dL — ABNORMAL LOW (ref 8.9–10.3)
Chloride: 105 mmol/L (ref 98–111)
Creatinine, Ser: 0.73 mg/dL (ref 0.61–1.24)
GFR calc non Af Amer: >60 mL/min (ref >60)
Glucose, Bld: 129 mg/dL — ABNORMAL HIGH (ref 70–99)
Potassium: 4.4 mmol/L (ref 3.5–5.1)
Sodium: 138 mmol/L (ref 135–145)

## 2017-11-28 LAB — CULTURE, BLOOD (ROUTINE X 2)
CULTURE: NO GROWTH
Culture: NO GROWTH
Special Requests: ADEQUATE
Special Requests: ADEQUATE

## 2017-11-28 LAB — GLUCOSE, CAPILLARY
GLUCOSE-CAPILLARY: 260 mg/dL — AB (ref 70–99)
Glucose-Capillary: 108 mg/dL — ABNORMAL HIGH (ref 70–99)

## 2017-11-28 IMAGING — DX DG CHEST 1V PORT
1 series · 1 of 1 positions shown · non-contrast
Comparison: PA and lateral chest x-ray January 08, 2017

CLINICAL DATA: Hypoxia. History of CHF, diabetes, atrial
fibrillation, prostate malignancy, former smoker.

EXAM:
PORTABLE CHEST 1 VIEW

[chest ap]
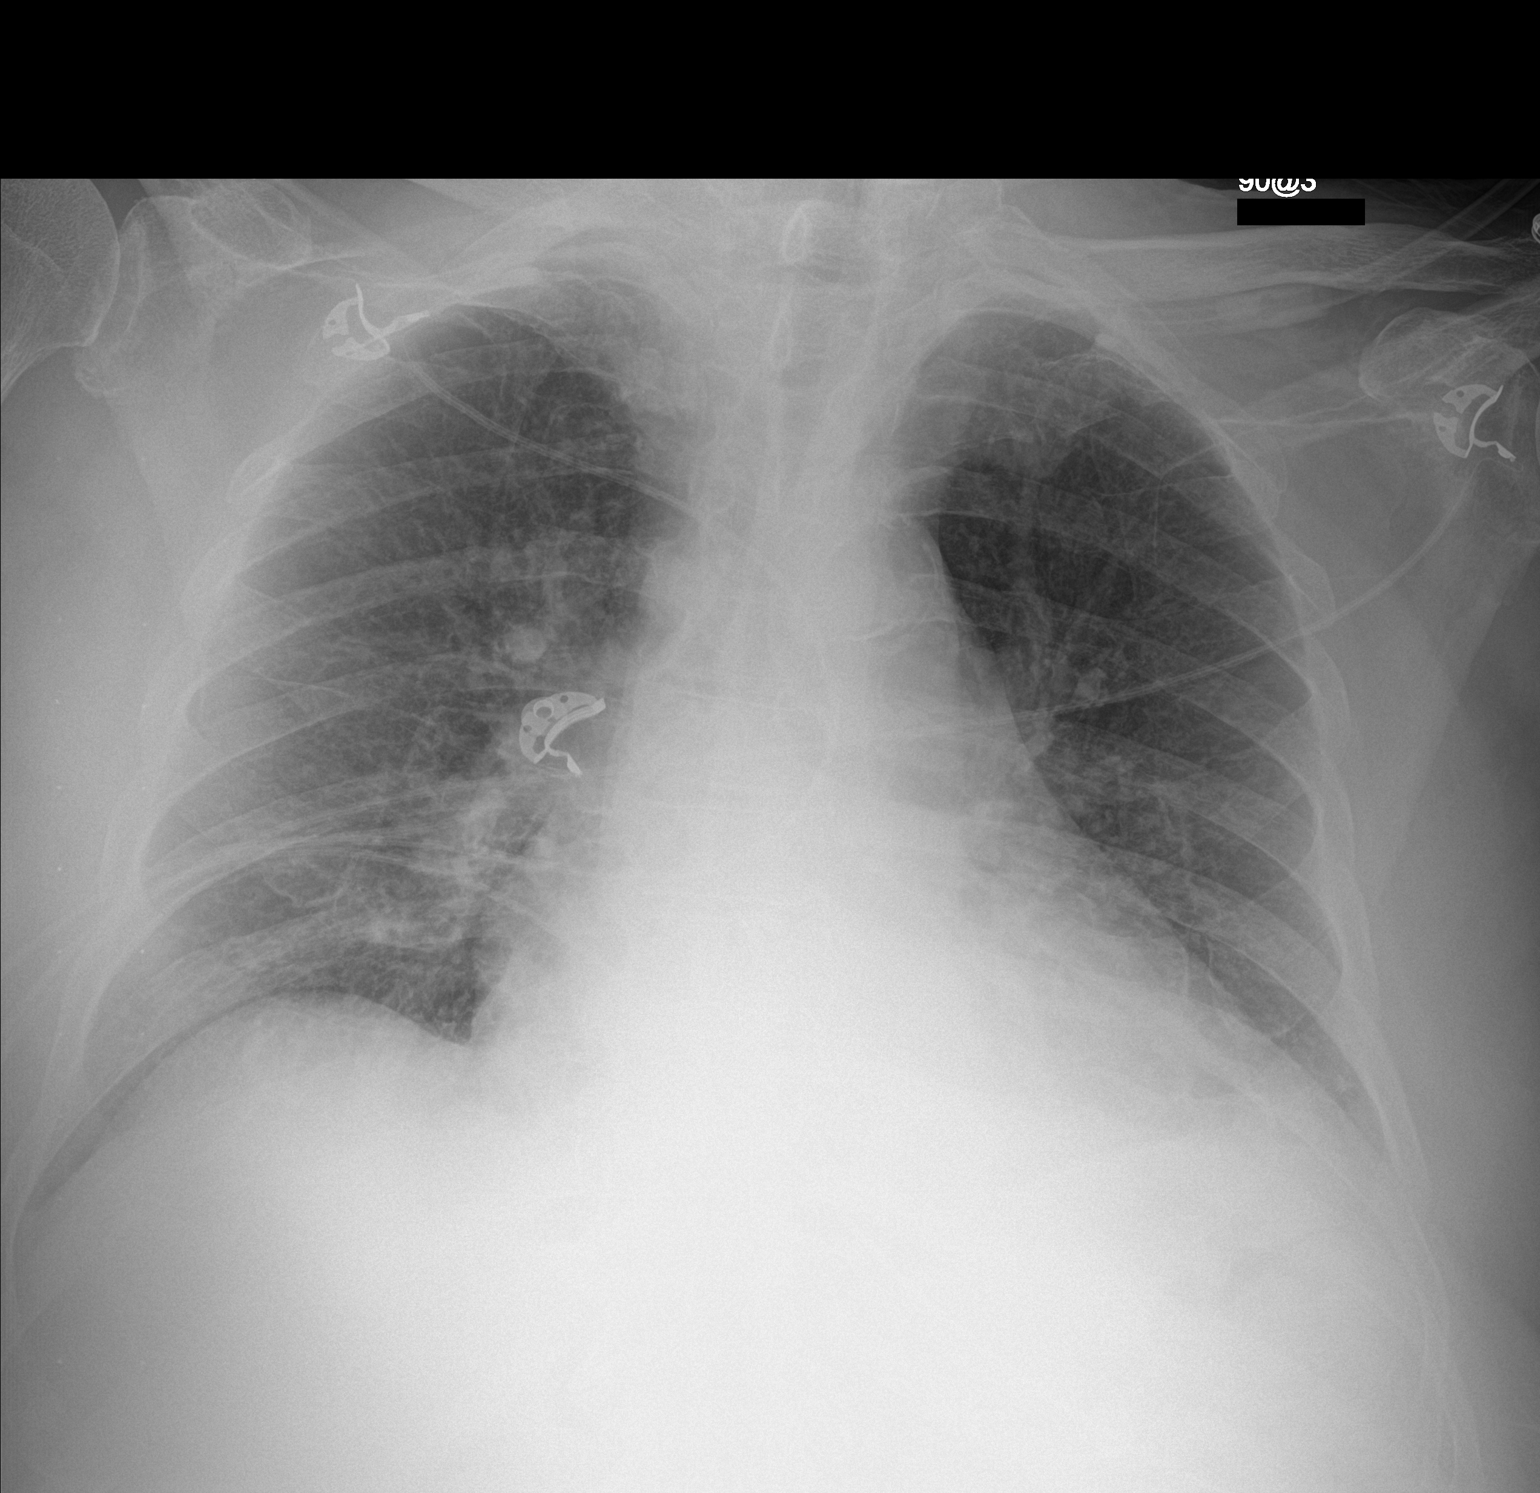

[1 of 1 positions shown; findings below may reference images not displayed]

FINDINGS: The lungs are adequately inflated. The lung markings remain coarse
in the left retrocardiac region. The cardiac silhouette remains
enlarged. The pulmonary vascularity remains engorged. The
interstitial markings are less conspicuous today. There is
calcification in the thoracic aortic arch. The observed bony thorax
is unremarkable.
IMPRESSION: Mild interval improvement in pulmonary interstitial edema secondary
to CHF. Persistent left lower lobe atelectasis or pneumonia. A small
left pleural effusion is suspected.

Thoracic aortic atherosclerosis.

## 2017-11-28 MED ORDER — COLLAGENASE 250 UNIT/GM EX OINT: TOPICAL_OINTMENT | Freq: Every day | CUTANEOUS | 0 refills | Status: DC

## 2017-11-28 MED ORDER — LISINOPRIL 2.5 MG PO TABS: 2.5000 mg | ORAL_TABLET | Freq: Every day | ORAL | 0 refills | Status: DC

## 2017-11-28 MED ORDER — DILTIAZEM HCL ER COATED BEADS 120 MG PO CP24: 120.0000 mg | ORAL_CAPSULE | Freq: Every day | ORAL | 0 refills | Status: DC

## 2017-11-28 MED ORDER — OXYCODONE HCL ER 10 MG PO T12A: 10.0000 mg | EXTENDED_RELEASE_TABLET | Freq: Two times a day (BID) | ORAL | 0 refills | Status: DC

## 2017-11-28 MED ORDER — DOXYCYCLINE HYCLATE 100 MG PO TABS: 100.0000 mg | ORAL_TABLET | Freq: Two times a day (BID) | ORAL | 0 refills | Status: DC

## 2017-11-28 MED ORDER — METOPROLOL TARTRATE 25 MG PO TABS: 25.0000 mg | ORAL_TABLET | Freq: Three times a day (TID) | ORAL | 0 refills | Status: DC

## 2017-11-28 MED ORDER — PREMIER PROTEIN SHAKE: 11.0000 [oz_av] | Freq: Two times a day (BID) | ORAL | 0 refills | Status: AC

## 2017-11-28 MED ORDER — POLYETHYLENE GLYCOL 3350 17 G PO PACK: 17.0000 g | PACK | Freq: Every day | ORAL | 0 refills | Status: DC

## 2017-11-28 MED ORDER — FUROSEMIDE 20 MG PO TABS: 10.0000 mg | ORAL_TABLET | Freq: Every day | ORAL | 0 refills | Status: DC

## 2017-11-28 MED ORDER — INSULIN LISPRO 100 UNIT/ML ~~LOC~~ SOLN: 3.0000 [IU] | Freq: Three times a day (TID) | SUBCUTANEOUS | 0 refills | Status: DC

## 2017-11-28 MED ORDER — BISACODYL 10 MG RE SUPP
10.0000 mg | Freq: Once | RECTAL | Status: AC
  Administered 2017-11-28: 10 mg via RECTAL
  Filled 2017-11-28: qty 1

## 2017-11-28 NOTE — Clinical Social Work Note (Signed)
Patient to be d/c'ed today to [facility] room E14.  Patient and family agreeable to plans will transport via ems RN to call report [phone].  CSW updated patient's wife Everlene Farrier to inform her that patient is discharging today.  Evette Cristal, MSW, [place]

## 2017-11-28 NOTE — Discharge Summary (Signed)
[facility] [place] NAME: [name]    MR#:  [id number]  DATE OF BIRTH:  [birth year]  DATE OF ADMISSION:  11/23/2017 ADMITTING PHYSICIAN: Nicholes Mango, MD  DATE OF DISCHARGE: 11/28/2017  PRIMARY CARE PHYSICIAN: System, Pcp Not In    ADMISSION DIAGNOSIS:  Hypoxia [R09.02] Acute on chronic systolic congestive heart failure (HCC) [I50.23]  DISCHARGE DIAGNOSIS:  Active Problems:   Acute CHF (congestive heart failure) ([place])   SECONDARY DIAGNOSIS:   Past Medical History:  Diagnosis Date  . Arthritis   . Atrial fibrillation ([place])   . Carcinoma of prostate ([place])   . CHF (congestive heart failure) ([place])   . Chronic kidney disease   . Diabetes mellitus without complication ([place])   . ED (erectile dysfunction)   . Frequent urination   . Hyperlipidemia   . Hypertension   . Moderate mitral insufficiency   . Peripheral vascular disease ([place])   . Pneumonia 04/2016  . Prostate cancer ([place])   . Sleep apnea    OSA--USE C-PAP  . Ulcer of left foot due to type 2 diabetes mellitus ([place])   . Urinary stress incontinence, male     HOSPITAL COURSE:   1.  Acute hypoxic respiratory failure.  This has improved and patient now breathing comfortably on room air.  At one point the patient was on 5 L of oxygen. 2.  Acute systolic congestive heart failure.  I started Entresto but the patient became hypotensive for couple days.  With hypotension patient is not a candidate for Entresto anymore.  I actually had to hold all medications and diuresis during the hospital course.   I first restarted metoprolol.  Can start lisinopril 2.5 mg and Lasix 10 mg daily as outpatient.  Follow-up at the CHF clinic.  Check a BMP weekly. 3.  Atrial fibrillation with rapid ventricular response and relative hypotension.  I initially had to hold antihypertensive medications because of the hypotension.  Then the heart rate started going fast.  We had a restart his medications for  heart rate.  He is currently on digoxin, metoprolol 25 mg 3 times daily and Cardizem CD 120 mg daily.  Can titrate metoprolol up if needed.  Patient on Xarelto for anticoagulation. 4.  MRSA in wound right lower extremity.  Recommend following up with the wound care center on a weekly basis.  I prescribed doxycycline.  I will do that for another 21 doses. 5.  Chronic kidney disease stage II. 6.  Acute encephalopathy.  This has improved.  May be secondary to wound infection or respiratory failure. 7.  History of sleep apnea.  Refused CPAP while in the hospital. 8.  Recent myocardial infarction and stroke as per wife. 9.  Type 2 diabetes mellitus on Lantus and 3 units of short acting insulin if he eats his meals. 10.  Recommend condom catheter while lying around in bed to prevent him from developing skin breakdown on his buttock. 11.  Elevated troponin demand ischemia from acute respiratory failure   DISCHARGE CONDITIONS:   Satisfactory  CONSULTS OBTAINED:  None  DRUG ALLERGIES:   Allergies  Allergen Reactions  . Contrast Media [Iodinated Diagnostic Agents] Shortness Of Breath    SOB  . Iohexol Shortness Of Breath     Desc: Respiratory Distress, Laryngedema, diaphoresis, Onset Date: [id number]   . Metrizamide Shortness Of Breath    SOB SOB SOB   . Latex Rash    DISCHARGE MEDICATIONS:   Allergies as  of 11/28/2017      Reactions   Contrast Media [iodinated Diagnostic Agents] Shortness Of Breath   SOB   Iohexol Shortness Of Breath    Desc: Respiratory Distress, Laryngedema, diaphoresis, Onset Date: [id number]   Metrizamide Shortness Of Breath   SOB SOB SOB   Latex Rash      Medication List    STOP taking these medications   insulin aspart 100 UNIT/ML injection Commonly known as:  novoLOG   metoprolol succinate 50 MG 24 hr tablet Commonly known as:  TOPROL-XL     TAKE these medications   aspirin EC 81 MG tablet Take 81 mg by mouth daily.   atorvastatin 80 MG  tablet Commonly known as:  LIPITOR Take 80 mg by mouth daily at 6 PM.   bisacodyl 5 MG EC tablet Commonly known as:  DULCOLAX Take 10 mg by mouth daily as needed for moderate constipation.   collagenase ointment Commonly known as:  SANTYL Apply topically daily. Right leg wound What changed:  additional instructions   digoxin 0.125 MG tablet Commonly known as:  LANOXIN Take 1 tablet (0.125 mg total) by mouth daily.   diltiazem 120 MG 24 hr capsule Commonly known as:  CARDIZEM CD Take 1 capsule (120 mg total) by mouth daily. What changed:    medication strength  how much to take   doxycycline 100 MG tablet Commonly known as:  VIBRA-TABS Take 1 tablet (100 mg total) by mouth 2 (two) times daily with a meal for 21 doses.   furosemide 20 MG tablet Commonly known as:  LASIX Take 0.5 tablets (10 mg total) by mouth daily.   insulin glargine 100 UNIT/ML injection Commonly known as:  LANTUS Inject 0.1 mLs (10 Units total) into the skin at bedtime.   insulin lispro 100 UNIT/ML injection Commonly known as:  HUMALOG Inject 0.03 mLs (3 Units total) into the skin 3 (three) times daily with meals.   lamoTRIgine 25 MG tablet Commonly known as:  LAMICTAL Take 100 mg by mouth 2 (two) times daily.   lisinopril 2.5 MG tablet Commonly known as:  PRINIVIL,ZESTRIL Take 1 tablet (2.5 mg total) by mouth daily.   metoprolol tartrate 25 MG tablet Commonly known as:  LOPRESSOR Take 1 tablet (25 mg total) by mouth 3 (three) times daily.   ondansetron 4 MG tablet Commonly known as:  ZOFRAN Take 4 mg by mouth every 8 (eight) hours as needed for nausea or vomiting.   oxyCODONE 10 mg 12 hr tablet Commonly known as:  OXYCONTIN Take 1 tablet (10 mg total) by mouth every 12 (twelve) hours.   polyethylene glycol packet Commonly known as:  MIRALAX / GLYCOLAX Take 17 g by mouth daily.   potassium chloride SA 20 MEQ tablet Commonly known as:  K-DUR,KLOR-CON Take 20 mEq by mouth daily.    protein supplement shake Liqd Commonly known as:  PREMIER PROTEIN Take 325 mLs (11 oz total) by mouth 2 (two) times daily between meals.   risperiDONE 0.5 MG tablet Commonly known as:  RISPERDAL Take 1 tablet (0.5 mg total) by mouth at bedtime.   rivaroxaban 20 MG Tabs tablet Commonly known as:  XARELTO Take 1 tablet (20 mg total) by mouth daily with supper.        DISCHARGE INSTRUCTIONS:   Follow-up with Dr. at facility 1 day Follow-up with wound care center 1 week Follow-up at CHF clinic  If you experience worsening of your admission symptoms, develop shortness of breath, life threatening emergency, suicidal  or homicidal thoughts you must seek medical attention immediately by calling 911 or calling your MD immediately  if symptoms less severe.  You Must read complete instructions/literature along with all the possible adverse reactions/side effects for all the Medicines you take and that have been prescribed to you. Take any new Medicines after you have completely understood and accept all the possible adverse reactions/side effects.   Please note  You were cared for by a hospitalist during your hospital stay. If you have any questions about your discharge medications or the care you received while you were in the hospital after you are discharged, you can call the unit and asked to speak with the hospitalist on call if the hospitalist that took care of you is not available. Once you are discharged, your primary care physician will handle any further medical issues. Please note that NO REFILLS for any discharge medications will be authorized once you are discharged, as it is imperative that you return to your primary care physician (or establish a relationship with a primary care physician if you do not have one) for your aftercare needs so that they can reassess your need for medications and monitor your lab values.    Today   CHIEF COMPLAINT:   Chief Complaint  Patient  presents with  . Irregular Heart Beat    HISTORY OF PRESENT ILLNESS:  [name]  is a 83 y.o. male admitted with respiratory distress.   VITAL SIGNS:  Blood pressure (!) 127/57, pulse 69, temperature 98 F (36.7 C), temperature source Oral, resp. rate 16, height 5\' 7"  (1.702 m), weight 76.6 kg (168 lb 14 oz), SpO2 91 %.    PHYSICAL EXAMINATION:  GENERAL:  83 y.o.-year-old patient lying in the bed with no acute distress.  EYES: Pupils equal, round, reactive to light and accommodation. No scleral icterus. Extraocular muscles intact.  HEENT: Head atraumatic, normocephalic. Oropharynx and nasopharynx clear.  NECK:  Supple, no jugular venous distention. No thyroid enlargement, no tenderness.  LUNGS: Normal breath sounds bilaterally, no wheezing, rales,rhonchi or crepitation. No use of accessory muscles of respiration.  CARDIOVASCULAR: S1, S2 irregularly irregular. No murmurs, rubs, or gallops.  ABDOMEN: Soft, non-tender, non-distended. Bowel sounds present. No organomegaly or mass.  EXTREMITIES: No pedal edema, cyanosis, or clubbing.  Left leg BKA NEUROLOGIC: Cranial nerves II through XII are intact. Muscle strength 5/5 in all extremities. Sensation intact. Gait not checked.  PSYCHIATRIC: The patient is alert and oriented x 3.  SKIN:  quarter size lesion right lower extremity with old greenish discoloration   DATA REVIEW:   CBC Recent Labs  Lab 11/23/17 1056  WBC 9.8  HGB 13.1  HCT 36.7*  PLT 368    Chemistries  Recent Labs  Lab 11/23/17 1056  11/28/17 0459  NA 141   < > 138  K 4.7   < > 4.4  CL 110   < > 105  CO2 23   < > 26  GLUCOSE 111*   < > 129*  BUN 40*   < > 36*  CREATININE 0.93   < > 0.73  CALCIUM 8.7*   < > 8.0*  AST 36  --   --   ALT 30  --   --   ALKPHOS 241*  --   --   BILITOT 1.7*  --   --    < > = values in this interval not displayed.    Cardiac Enzymes Recent Labs  Lab 11/24/17 0215  TROPONINI  0.21*    Microbiology Results  Results  for orders placed or performed during the hospital encounter of 11/23/17  Culture, blood (routine x 2) Call MD if unable to obtain prior to antibiotics being given     Status: None   Collection Time: 11/23/17  2:04 PM  Result Value Ref Range Status   Specimen Description BLOOD RIGHT ANTECUBITAL  Final   Special Requests   Final    BOTTLES DRAWN AEROBIC AND ANAEROBIC Blood Culture adequate volume   Culture   Final    NO GROWTH 5 DAYS Performed at [facility], [address] [place], [place] [address]    Report Status 11/28/2017 FINAL  Final  Culture, blood (routine x 2) Call MD if unable to obtain prior to antibiotics being given     Status: None   Collection Time: 11/23/17  2:14 PM  Result Value Ref Range Status   Specimen Description BLOOD BLOOD RIGHT WRIST  Final   Special Requests   Final    BOTTLES DRAWN AEROBIC AND ANAEROBIC Blood Culture adequate volume   Culture   Final    NO GROWTH 5 DAYS Performed at [facility], [address] [place], [place] [address]    Report Status 11/28/2017 FINAL  Final  MRSA PCR Screening     Status: None   Collection Time: 11/23/17  2:56 PM  Result Value Ref Range Status   MRSA by PCR NEGATIVE NEGATIVE Final    Comment:        The GeneXpert MRSA Assay (FDA approved for NASAL specimens only), is one component of a comprehensive MRSA colonization surveillance program. It is not intended to diagnose MRSA infection nor to guide or monitor treatment for MRSA infections. Performed at [facility], [address], [place], [place] [address]   Aerobic/Anaerobic Culture (surgical/deep wound)     Status: None (Preliminary result)   Collection Time: 11/24/17 10:04 PM  Result Value Ref Range Status   Specimen Description   Final    WOUND RIGHT LEG Performed at [facility], [address] [place], [place] [address]    Special Requests   Final    NONE Performed at [facility], [address] [place], [place] [address]    Gram Stain   Final    RARE WBC PRESENT, PREDOMINANTLY PMN MODERATE GRAM POSITIVE COCCI IN PAIRS IN CLUSTERS RARE GRAM POSITIVE RODS RARE GRAM NEGATIVE RODS RARE BUDDING YEAST SEEN Performed at [facility], [place] [address] [place],  [address]    Culture   Final    ABUNDANT METHICILLIN RESISTANT STAPHYLOCOCCUS AUREUS WITHIN MIXED CULTURE NO ANAEROBES ISOLATED; CULTURE IN PROGRESS FOR 5 DAYS    Report Status PENDING  Incomplete   Organism ID, Bacteria METHICILLIN RESISTANT STAPHYLOCOCCUS AUREUS  Final      Susceptibility   Methicillin resistant staphylococcus aureus - MIC*    CIPROFLOXACIN >=8 RESISTANT Resistant     ERYTHROMYCIN >=8 RESISTANT Resistant     GENTAMICIN <=0.5 SENSITIVE Sensitive     OXACILLIN >=4 RESISTANT Resistant     TETRACYCLINE <=1 SENSITIVE Sensitive     VANCOMYCIN <=0.5 SENSITIVE Sensitive     TRIMETH/SULFA >=320 RESISTANT Resistant     CLINDAMYCIN <=0.25 SENSITIVE Sensitive     RIFAMPIN <=0.5 SENSITIVE Sensitive     Inducible Clindamycin NEGATIVE Sensitive     * ABUNDANT METHICILLIN RESISTANT STAPHYLOCOCCUS AUREUS  Respiratory Panel by PCR     Status: None   Collection  Time: 11/25/17  7:48 PM  Result Value Ref Range Status   Adenovirus NOT DETECTED NOT DETECTED Final   Coronavirus 229E NOT DETECTED NOT DETECTED Final   Coronavirus HKU1 NOT DETECTED NOT DETECTED Final   Coronavirus NL63 NOT DETECTED NOT DETECTED Final   Coronavirus OC43 NOT DETECTED NOT DETECTED Final   Metapneumovirus NOT DETECTED NOT DETECTED Final   Rhinovirus / Enterovirus NOT DETECTED NOT DETECTED Final   Influenza A NOT DETECTED NOT DETECTED Final   Influenza B NOT DETECTED NOT DETECTED Final   Parainfluenza Virus 1 NOT DETECTED NOT DETECTED Final   Parainfluenza Virus 2 NOT DETECTED NOT DETECTED Final   Parainfluenza Virus 3 NOT DETECTED NOT DETECTED Final   Parainfluenza Virus 4 NOT DETECTED NOT DETECTED Final   Respiratory  Syncytial Virus NOT DETECTED NOT DETECTED Final   Bordetella pertussis NOT DETECTED NOT DETECTED Final   Chlamydophila pneumoniae NOT DETECTED NOT DETECTED Final   Mycoplasma pneumoniae NOT DETECTED NOT DETECTED Final    Comment: Performed at [facility], [place] [address], [place], [name] [address]     Management plans discussed with the patient, family and they are in agreement.  CODE STATUS:     Code Status Orders  (From admission, onward)        Start     Ordered   11/23/17 1344  Full code  Continuous     11/23/17 1346    Code Status History    Date Active Date Inactive Code Status Order ID Comments User Context   10/26/2017 1534 10/28/2017 1854 Full Code [id number]  Bettey Costa, MD Inpatient   10/26/2017 1437 10/26/2017 1534 Full Code [id number]  Feliberto Gottron, RN Inpatient   10/24/2017 1152 10/26/2017 1437 DNR [id number]  Bettey Costa, MD Inpatient   10/15/2017 0309 10/24/2017 1152 Full Code [id number]  Arta Silence, MD Inpatient   02/10/2017 1904 02/12/2017 1908 Full Code [id number]  Schnier, Dolores Lory, MD ED   02/10/2017 1638 02/10/2017 1904 Full Code [id number]  Sela Hua, PA-C Outpatient   01/08/2017 1342 01/19/2017 2103 Full Code [id number]  Loletha Grayer, MD ED   12/29/2016 2232 12/30/2016 1634 Full Code [id number]  Lance Coon, MD ED   12/20/2016 1251 12/20/2016 1842 Full Code [id number]  Algernon Huxley, MD Inpatient   10/22/2016 0040 10/24/2016 2058 Full Code [id number]  Lance Coon, MD Inpatient   10/14/2016 1201 10/14/2016 1925 Full Code [id number]  Algernon Huxley, MD Inpatient   08/30/2016 1338 09/06/2016 1941 Full Code [id number]  Epifanio Lesches, MD Inpatient   05/19/2016 0454 05/22/2016 1907 Full Code [id number]  Saundra Shelling, MD Inpatient    Advance Directive Documentation     Most Recent Value  Type of Advance Directive  Living will  Pre-existing out of facility DNR order (yellow form or pink MOST form)  -  "MOST" Form in Place?  -      TOTAL TIME TAKING  CARE OF THIS PATIENT: 35 minutes.    Loletha Grayer M.D on 11/28/2017 at 8:02 AM  Between 7am to 6pm - Pager - [phone]  After 6pm go to [email or web address] - password [email or web address] [facility]  [employer] Office  [phone]  CC: Primary care physician; System, Pcp Not In

## 2017-11-28 NOTE — Progress Notes (Signed)
Pt discharged to [facility] this afternoon via EMS, no complaints VSS.

## 2017-11-28 NOTE — Care Management Important Message (Signed)
Important Message  Patient Details  Name: [name] MRN: [id number] Date of Birth: 01/19/1935   Medicare Important Message Given:  Yes    Katrina Stack, RN 11/28/2017, 10:33 AM

## 2017-11-30 LAB — AEROBIC/ANAEROBIC CULTURE (SURGICAL/DEEP WOUND)

## 2017-11-30 LAB — AEROBIC/ANAEROBIC CULTURE W GRAM STAIN (SURGICAL/DEEP WOUND)

## 2017-12-01 ENCOUNTER — Ambulatory Visit: Payer: Medicare Other | Admitting: Urology

## 2017-12-08 ENCOUNTER — Ambulatory Visit: Payer: Medicare Other | Attending: Family | Admitting: Family

## 2017-12-08 ENCOUNTER — Ambulatory Visit: Payer: Medicare Other | Admitting: Nurse Practitioner

## 2017-12-08 ENCOUNTER — Encounter: Payer: Self-pay | Admitting: Family

## 2017-12-08 VITALS — BP 123/58 | HR 72 | Resp 18 | Ht 67.0 in | Wt 175.0 lb

## 2017-12-08 DIAGNOSIS — I4891 Unspecified atrial fibrillation: Secondary | ICD-10-CM | POA: Insufficient documentation

## 2017-12-08 DIAGNOSIS — I252 Old myocardial infarction: Secondary | ICD-10-CM | POA: Diagnosis not present

## 2017-12-08 DIAGNOSIS — I130 Hypertensive heart and chronic kidney disease with heart failure and stage 1 through stage 4 chronic kidney disease, or unspecified chronic kidney disease: Secondary | ICD-10-CM | POA: Diagnosis not present

## 2017-12-08 DIAGNOSIS — N189 Chronic kidney disease, unspecified: Secondary | ICD-10-CM | POA: Insufficient documentation

## 2017-12-08 DIAGNOSIS — Z7982 Long term (current) use of aspirin: Secondary | ICD-10-CM | POA: Diagnosis not present

## 2017-12-08 DIAGNOSIS — I5032 Chronic diastolic (congestive) heart failure: Secondary | ICD-10-CM

## 2017-12-08 DIAGNOSIS — Z8249 Family history of ischemic heart disease and other diseases of the circulatory system: Secondary | ICD-10-CM | POA: Diagnosis not present

## 2017-12-08 DIAGNOSIS — E785 Hyperlipidemia, unspecified: Secondary | ICD-10-CM | POA: Diagnosis not present

## 2017-12-08 DIAGNOSIS — I10 Essential (primary) hypertension: Secondary | ICD-10-CM

## 2017-12-08 DIAGNOSIS — Z794 Long term (current) use of insulin: Secondary | ICD-10-CM

## 2017-12-08 DIAGNOSIS — Z8546 Personal history of malignant neoplasm of prostate: Secondary | ICD-10-CM | POA: Diagnosis not present

## 2017-12-08 DIAGNOSIS — E119 Type 2 diabetes mellitus without complications: Secondary | ICD-10-CM

## 2017-12-08 DIAGNOSIS — Z87891 Personal history of nicotine dependence: Secondary | ICD-10-CM | POA: Insufficient documentation

## 2017-12-08 DIAGNOSIS — Z89512 Acquired absence of left leg below knee: Secondary | ICD-10-CM | POA: Insufficient documentation

## 2017-12-08 DIAGNOSIS — E1152 Type 2 diabetes mellitus with diabetic peripheral angiopathy with gangrene: Secondary | ICD-10-CM | POA: Insufficient documentation

## 2017-12-08 DIAGNOSIS — Z833 Family history of diabetes mellitus: Secondary | ICD-10-CM | POA: Diagnosis not present

## 2017-12-08 DIAGNOSIS — G4733 Obstructive sleep apnea (adult) (pediatric): Secondary | ICD-10-CM | POA: Diagnosis not present

## 2017-12-08 DIAGNOSIS — E1122 Type 2 diabetes mellitus with diabetic chronic kidney disease: Secondary | ICD-10-CM | POA: Diagnosis not present

## 2017-12-08 DIAGNOSIS — Z9049 Acquired absence of other specified parts of digestive tract: Secondary | ICD-10-CM | POA: Diagnosis not present

## 2017-12-08 DIAGNOSIS — Z79899 Other long term (current) drug therapy: Secondary | ICD-10-CM | POA: Insufficient documentation

## 2017-12-08 DIAGNOSIS — Z7901 Long term (current) use of anticoagulants: Secondary | ICD-10-CM | POA: Insufficient documentation

## 2017-12-08 DIAGNOSIS — R5383 Other fatigue: Secondary | ICD-10-CM | POA: Diagnosis present

## 2017-12-08 NOTE — Progress Notes (Signed)
Patient ID: [name], male    DOB: [birth year], 83 y.o.   MRN: [id number]  HPI  Mr Crisoforo Oxford is a 83 y/o male with a history of atrial fibrillation, prostate cancer, CKD, DM, hyperlipidemia, HTN, PVD, obstructive sleep apnea (CPAP), gangrene, previous tobacco use and chronic heart failure.  Reviewed echo report from 11/24/17 which showed an EF of 40% along with mild MR and a PA pressure of 58 mm Hg. Reviewed echo report done 05/19/16 which showed an EF of 60-65% along with moderate MR.   Admitted 11/23/17 due to acute on chronic HF. Delene Loll was tried but patient became hypotensive and it, along with other medications, had to be stopped. Needed antibiotics due to MRSA. Elevated troponin thought to be due to demand ischemia. Discharged after 5 days. Admitted 10/14/17 due to NSTEMI along with metabolic enchephalopathy. Neurology, cardiology, pulmonology and palliative care consults were obtained. Discharged after 14 days.   He presents today for a follow-up visit with a chief complaint of moderate fatigue upon minimal exertion. He has associated weakness, depression, hip pain and wound on left stump and right lower leg. He denies any difficulty sleeping, abdominal distention, palpitations, pedal edema, chest pain, shortness of breath or dizziness. Thinks that he's getting weighed daily.   Past Medical History:  Diagnosis Date  . Arthritis   . Atrial fibrillation ([place])   . Carcinoma of prostate ([place])   . CHF (congestive heart failure) ([place])   . Chronic kidney disease   . Diabetes mellitus without complication ([place])   . ED (erectile dysfunction)   . Frequent urination   . Hyperlipidemia   . Hypertension   . Moderate mitral insufficiency   . Peripheral vascular disease ([place])   . Pneumonia 04/2016  . Prostate cancer ([place])   . Sleep apnea    OSA--USE C-PAP  . Ulcer of left foot due to type 2 diabetes mellitus ([place])   . Urinary stress incontinence, male    Past Surgical History:   Procedure Laterality Date  . AMPUTATION Left 01/12/2017   Procedure: AMPUTATION BELOW KNEE;  Surgeon: Algernon Huxley, MD;  Location: [facility];  Service: General;  Laterality: Left;  . APLIGRAFT PLACEMENT Left 10/15/2016   Procedure: APLIGRAFT PLACEMENT;  Surgeon: Albertine Patricia, DPM;  Location: [facility];  Service: Podiatry;  Laterality: Left;  necrotic ulcer  . CHOLECYSTECTOMY    . EYE SURGERY Bilateral    Cataract Extraction with IOL  . IRRIGATION AND DEBRIDEMENT FOOT Left 10/15/2016   Procedure: IRRIGATION AND DEBRIDEMENT FOOT-EXCISIONAL DEBRIDEMENT OF SKIN 3RD, 4TH AND 5TH TOES WITH APPLICATION OF APLIGRAFT;  Surgeon: Albertine Patricia, DPM;  Location: [facility];  Service: Podiatry;  Laterality: Left;  necrotic,gangrene  . IRRIGATION AND DEBRIDEMENT FOOT Left 12/09/2016   Procedure: IRRIGATION AND DEBRIDEMENT FOOT-MUSCLE/FASCIA, RESECTION OF FOURTH AND FIFTH METATARSAL NECROTIC BONE;  Surgeon: Albertine Patricia, DPM;  Location: [facility];  Service: Podiatry;  Laterality: Left;  . LOWER EXTREMITY ANGIOGRAPHY Left 08/16/2016   Procedure: Lower Extremity Angiography;  Surgeon: Algernon Huxley, MD;  Location: [place] [facility];  Service: Cardiovascular;  Laterality: Left;  . LOWER EXTREMITY ANGIOGRAPHY Left 09/01/2016   Procedure: Lower Extremity Angiography;  Surgeon: Algernon Huxley, MD;  Location: [place] [facility];  Service: Cardiovascular;  Laterality: Left;  . LOWER EXTREMITY ANGIOGRAPHY Left 10/14/2016   Procedure: Lower Extremity Angiography;  Surgeon: Algernon Huxley, MD;  Location: [place] [facility];  Service: Cardiovascular;  Laterality: Left;  . LOWER EXTREMITY ANGIOGRAPHY Left  12/20/2016   Procedure: Lower Extremity Angiography;  Surgeon: Algernon Huxley, MD;  Location: [place] [facility];  Service: Cardiovascular;  Laterality: Left;  . LOWER EXTREMITY INTERVENTION  09/01/2016   Procedure: Lower Extremity Intervention;  Surgeon: Algernon Huxley, MD;  Location: [place] [facility];  Service:  Cardiovascular;;  . PROSTATE SURGERY     removal  . TONSILLECTOMY     as a child   Family History  Problem Relation Age of Onset  . Hypertension Mother   . Diabetes Mother   . Prostate cancer Neg Hx   . Bladder Cancer Neg Hx   . Kidney disease Neg Hx    Social History   Tobacco Use  . Smoking status: Former Smoker    Packs/day: 0.25    Types: Cigarettes    Last attempt to quit: 10/14/1994    Years since quitting: 23.1  . Smokeless tobacco: Never Used  Substance Use Topics  . Alcohol use: No    Alcohol/week: 0.0 oz   Allergies  Allergen Reactions  . Contrast Media [Iodinated Diagnostic Agents] Shortness Of Breath    SOB  . Iohexol Shortness Of Breath     Desc: Respiratory Distress, Laryngedema, diaphoresis, Onset Date: 29562130   . Metrizamide Shortness Of Breath    SOB SOB SOB   . Latex Rash   Prior to Admission medications   Medication Sig Start Date End Date Taking? Authorizing Provider  aspirin EC 81 MG tablet Take 81 mg by mouth daily.   Yes [provider]  atorvastatin (LIPITOR) 80 MG tablet Take 80 mg by mouth daily at 6 PM.    Yes [provider]  bisacodyl (DULCOLAX) 5 MG EC tablet Take 10 mg by mouth daily as needed for moderate constipation.   Yes [provider]  collagenase (SANTYL) ointment Apply topically daily. Right leg wound 11/28/17  Yes Wieting, Richard, MD  digoxin (LANOXIN) 0.125 MG tablet Take 1 tablet (0.125 mg total) by mouth daily. 01/20/17  Yes Henreitta Leber, MD  diltiazem (CARDIZEM CD) 120 MG 24 hr capsule Take 1 capsule (120 mg total) by mouth daily. 11/28/17  Yes Wieting, Richard, MD  furosemide (LASIX) 20 MG tablet Take 0.5 tablets (10 mg total) by mouth daily. 11/28/17  Yes Wieting, Richard, MD  insulin glargine (LANTUS) 100 UNIT/ML injection Inject 0.1 mLs (10 Units total) into the skin at bedtime. 10/28/17  Yes Mody, Sital, MD  insulin lispro (HUMALOG) 100 UNIT/ML injection Inject 0.03 mLs (3 Units total) into  the skin 3 (three) times daily with meals. 11/28/17  Yes Wieting, Richard, MD  lamoTRIgine (LAMICTAL) 25 MG tablet Take 100 mg by mouth 2 (two) times daily.  04/20/17  Yes [provider]  lisinopril (PRINIVIL,ZESTRIL) 2.5 MG tablet Take 1 tablet (2.5 mg total) by mouth daily. 11/28/17 11/28/18 Yes Wieting, Richard, MD  metoprolol tartrate (LOPRESSOR) 25 MG tablet Take 1 tablet (25 mg total) by mouth 3 (three) times daily. 11/28/17  Yes Wieting, Richard, MD  ondansetron (ZOFRAN) 4 MG tablet Take 4 mg by mouth every 8 (eight) hours as needed for nausea or vomiting.   Yes [provider]  oxyCODONE (OXYCONTIN) 10 mg 12 hr tablet Take 1 tablet (10 mg total) by mouth every 12 (twelve) hours. 11/28/17  Yes Wieting, Richard, MD  polyethylene glycol (MIRALAX / GLYCOLAX) packet Take 17 g by mouth daily. 11/28/17  Yes Wieting, Richard, MD  potassium chloride SA (K-DUR,KLOR-CON) 20 MEQ tablet Take 20 mEq by mouth  daily.   Yes [provider]  protein supplement shake (PREMIER PROTEIN) LIQD Take 325 mLs (11 oz total) by mouth 2 (two) times daily between meals. 11/28/17 12/28/17 Yes Wieting, Richard, MD  risperiDONE (RISPERDAL) 0.5 MG tablet Take 1 tablet (0.5 mg total) by mouth at bedtime. 10/28/17  Yes Bettey Costa, MD  rivaroxaban (XARELTO) 20 MG TABS tablet Take 1 tablet (20 mg total) by mouth daily with supper. 10/28/17  Yes Bettey Costa, MD    Review of Systems  Constitutional: Positive for fatigue. Negative for appetite change.  HENT: Positive for hearing loss. Negative for congestion and sore throat.   Eyes: Negative.   Respiratory: Negative for chest tightness and shortness of breath.   Cardiovascular: Negative for chest pain, palpitations and leg swelling.  Gastrointestinal: Negative for abdominal distention and abdominal pain.  Endocrine: Negative.   Genitourinary: Negative.   Musculoskeletal: Positive for arthralgias (both hips).  Skin: Positive for wound (left BKA, right lower leg).   Allergic/Immunologic: Negative.   Neurological: Positive for weakness. Negative for dizziness and light-headedness.  Hematological: Negative for adenopathy. Bruises/bleeds easily.  Psychiatric/Behavioral: Positive for dysphoric mood. Negative for sleep disturbance. The patient is not nervous/anxious.    Vitals:   12/08/17 1318  BP: (!) 123/58  Pulse: 72  Resp: 18  SpO2: 99%  Weight: 175 lb (79.4 kg)  Height: 5\' 7"  (1.702 m)   Wt Readings from Last 3 Encounters:  12/08/17 175 lb (79.4 kg)  11/28/17 168 lb 14 oz (76.6 kg)  11/18/17 182 lb (82.6 kg)   Lab Results  Component Value Date   CREATININE 0.73 11/28/2017   CREATININE 0.89 11/27/2017   CREATININE 1.13 11/26/2017    Physical Exam  Constitutional: He is oriented to person, place, and time. He appears well-developed and well-nourished.  HENT:  Head: Normocephalic and atraumatic.  Neck: Normal range of motion. Neck supple. No JVD present.  Cardiovascular: Normal rate. An irregular rhythm present.  Pulmonary/Chest: Effort normal. He has no wheezes. He has no rales.  Abdominal: Soft. He exhibits no distension. There is no tenderness.  Musculoskeletal: He exhibits deformity (left BKA). He exhibits no edema or tenderness.  Neurological: He is alert and oriented to person, place, and time.  Skin: Skin is warm and dry. Lesion (wound left stump and right lower leg) noted.  Psychiatric: He has a normal mood and affect. His behavior is normal. Thought content normal.  Nursing note and vitals reviewed.   Assessment & Plan:  1: Chronic heart failure with preserved ejection fraction- - NYHA class III - euvolemic - says that he's being weighed daily at [facility] and that his weight today was 175 lbs. Did not weigh patient in clinic due to instability with left BKA - not adding salt to his food and wife tries to read food labels. Discussed the importance of closely following a 2000mg  sodium diet when he comes home from  [place] - saw cardiologist Nehemiah Massed) 07/26/17  2: HTN- - BP looks good today - BMP done 11/28/17 reviewed and shows a sodium 138, potassium 4.4 and GFR of >60  3: Diabetes- - s/p left BKA; admits to some difficulty adjusting to not having part of his leg - currently has wound on left stump and right lower leg; being followed by vascular - A1c done 12/30/16 was 7.4% - saw endocrinologist Eddie Dibbles) 08/09/17  Facility medication list was reviewed.  Due to current HF stability, will not make a return appointment at this time. Advised patient and his wife,  that they could call back at anytime to make another appointment.

## 2017-12-08 NOTE — Patient Instructions (Signed)
Begin weighing daily and call for an overnight weight gain of > 2 pounds or a weekly weight gain of >5 pounds. 

## 2018-01-16 ENCOUNTER — Emergency Department: Payer: Medicare Other

## 2018-01-16 ENCOUNTER — Other Ambulatory Visit: Payer: Self-pay

## 2018-01-16 ENCOUNTER — Inpatient Hospital Stay
Admission: EM | Admit: 2018-01-16 | Discharge: 2018-01-18 | DRG: 281 | Disposition: A | Discharge status: Home / Self Care | Payer: Medicare Other | Attending: Internal Medicine | Admitting: Internal Medicine

## 2018-01-16 ENCOUNTER — Encounter: Payer: Self-pay | Admitting: Emergency Medicine

## 2018-01-16 DIAGNOSIS — Z8546 Personal history of malignant neoplasm of prostate: Secondary | ICD-10-CM

## 2018-01-16 DIAGNOSIS — Z79899 Other long term (current) drug therapy: Secondary | ICD-10-CM | POA: Diagnosis not present

## 2018-01-16 DIAGNOSIS — E1151 Type 2 diabetes mellitus with diabetic peripheral angiopathy without gangrene: Secondary | ICD-10-CM | POA: Diagnosis present

## 2018-01-16 DIAGNOSIS — Z91040 Latex allergy status: Secondary | ICD-10-CM

## 2018-01-16 DIAGNOSIS — Z794 Long term (current) use of insulin: Secondary | ICD-10-CM

## 2018-01-16 DIAGNOSIS — I482 Chronic atrial fibrillation: Secondary | ICD-10-CM | POA: Diagnosis present

## 2018-01-16 DIAGNOSIS — I214 Non-ST elevation (NSTEMI) myocardial infarction: Principal | ICD-10-CM | POA: Diagnosis present

## 2018-01-16 DIAGNOSIS — Z91041 Radiographic dye allergy status: Secondary | ICD-10-CM | POA: Diagnosis not present

## 2018-01-16 DIAGNOSIS — Z89512 Acquired absence of left leg below knee: Secondary | ICD-10-CM | POA: Diagnosis not present

## 2018-01-16 DIAGNOSIS — I255 Ischemic cardiomyopathy: Secondary | ICD-10-CM | POA: Diagnosis present

## 2018-01-16 DIAGNOSIS — Z7982 Long term (current) use of aspirin: Secondary | ICD-10-CM | POA: Diagnosis not present

## 2018-01-16 DIAGNOSIS — S81801A Unspecified open wound, right lower leg, initial encounter: Secondary | ICD-10-CM | POA: Diagnosis present

## 2018-01-16 DIAGNOSIS — N182 Chronic kidney disease, stage 2 (mild): Secondary | ICD-10-CM | POA: Diagnosis present

## 2018-01-16 DIAGNOSIS — I25119 Atherosclerotic heart disease of native coronary artery with unspecified angina pectoris: Secondary | ICD-10-CM | POA: Diagnosis present

## 2018-01-16 DIAGNOSIS — E785 Hyperlipidemia, unspecified: Secondary | ICD-10-CM | POA: Diagnosis present

## 2018-01-16 DIAGNOSIS — I130 Hypertensive heart and chronic kidney disease with heart failure and stage 1 through stage 4 chronic kidney disease, or unspecified chronic kidney disease: Secondary | ICD-10-CM | POA: Diagnosis present

## 2018-01-16 DIAGNOSIS — Z66 Do not resuscitate: Secondary | ICD-10-CM | POA: Diagnosis present

## 2018-01-16 DIAGNOSIS — M1990 Unspecified osteoarthritis, unspecified site: Secondary | ICD-10-CM | POA: Diagnosis present

## 2018-01-16 DIAGNOSIS — Z888 Allergy status to other drugs, medicaments and biological substances status: Secondary | ICD-10-CM | POA: Diagnosis not present

## 2018-01-16 DIAGNOSIS — E1122 Type 2 diabetes mellitus with diabetic chronic kidney disease: Secondary | ICD-10-CM | POA: Diagnosis present

## 2018-01-16 DIAGNOSIS — G4733 Obstructive sleep apnea (adult) (pediatric): Secondary | ICD-10-CM | POA: Diagnosis present

## 2018-01-16 DIAGNOSIS — Z7901 Long term (current) use of anticoagulants: Secondary | ICD-10-CM

## 2018-01-16 DIAGNOSIS — I5022 Chronic systolic (congestive) heart failure: Secondary | ICD-10-CM | POA: Diagnosis present

## 2018-01-16 DIAGNOSIS — Z87891 Personal history of nicotine dependence: Secondary | ICD-10-CM | POA: Diagnosis not present

## 2018-01-16 DIAGNOSIS — Z8249 Family history of ischemic heart disease and other diseases of the circulatory system: Secondary | ICD-10-CM

## 2018-01-16 LAB — HEMOGLOBIN A1C
HEMOGLOBIN A1C: 6.7 % — AB (ref 4.8–5.6)
MEAN PLASMA GLUCOSE: 145.59 mg/dL

## 2018-01-16 LAB — GLUCOSE, CAPILLARY: GLUCOSE-CAPILLARY: 231 mg/dL — AB (ref 70–99)

## 2018-01-16 LAB — BASIC METABOLIC PANEL
ANION GAP: 7 (ref 5–15)
BUN: 26 mg/dL — AB (ref 8–23)
CHLORIDE: 105 mmol/L (ref 98–111)
CO2: 27 mmol/L (ref 22–32)
Calcium: 9 mg/dL (ref 8.9–10.3)
Creatinine, Ser: 0.97 mg/dL (ref 0.61–1.24)
GFR calc Af Amer: >60 mL/min (ref >60)
GLUCOSE: 232 mg/dL — AB (ref 70–99)
POTASSIUM: 4.1 mmol/L (ref 3.5–5.1)
Sodium: 139 mmol/L (ref 135–145)

## 2018-01-16 LAB — HEPARIN LEVEL (UNFRACTIONATED): Heparin Unfractionated: 0.81 IU/mL — ABNORMAL HIGH (ref 0.30–0.70)

## 2018-01-16 LAB — CBC
HEMATOCRIT: 41.8 % (ref 40.0–52.0)
HEMOGLOBIN: 14.1 g/dL (ref 13.0–18.0)
MCH: 28.7 pg (ref 26.0–34.0)
MCHC: 33.8 g/dL (ref 32.0–36.0)
MCV: 85.1 fL (ref 80.0–100.0)
Platelets: 232 10*3/uL (ref 150–440)
RBC: 4.91 MIL/uL (ref 4.40–5.90)
RDW: 14.4 % (ref 11.5–14.5)
WBC: 7.9 10*3/uL (ref 3.8–10.6)

## 2018-01-16 LAB — DIGOXIN LEVEL: DIGOXIN LVL: 0.4 ng/mL — AB (ref 0.8–2.0)

## 2018-01-16 LAB — MRSA PCR SCREENING: MRSA by PCR: NEGATIVE

## 2018-01-16 LAB — APTT: aPTT: 31 seconds (ref 24–36)

## 2018-01-16 LAB — PROTIME-INR
INR: 1.2
Prothrombin Time: 15.1 seconds (ref 11.4–15.2)

## 2018-01-16 LAB — TROPONIN I
TROPONIN I: 0.58 ng/mL — AB (ref <0.03)
Troponin I: 0.04 ng/mL (ref <0.03)
Troponin I: 0.29 ng/mL (ref <0.03)

## 2018-01-16 LAB — BRAIN NATRIURETIC PEPTIDE: B NATRIURETIC PEPTIDE 5: 920 pg/mL — AB (ref 0.0–100.0)

## 2018-01-16 MED ORDER — INSULIN GLARGINE 100 UNIT/ML ~~LOC~~ SOLN
10.0000 [IU] | Freq: Every day | SUBCUTANEOUS | Status: DC
  Administered 2018-01-16 – 2018-01-17 (×2): 10 [IU] via SUBCUTANEOUS
  Filled 2018-01-16 (×3): qty 0.1

## 2018-01-16 MED ORDER — ONDANSETRON HCL 4 MG/2ML IJ SOLN: 4.0000 mg | Freq: Four times a day (QID) | INTRAMUSCULAR | Status: DC | PRN

## 2018-01-16 MED ORDER — SODIUM CHLORIDE 0.9% FLUSH
3.0000 mL | Freq: Two times a day (BID) | INTRAVENOUS | Status: DC
  Administered 2018-01-16 – 2018-01-18 (×4): 3 mL via INTRAVENOUS

## 2018-01-16 MED ORDER — METOPROLOL TARTRATE 25 MG PO TABS
25.0000 mg | ORAL_TABLET | Freq: Three times a day (TID) | ORAL | Status: DC
  Administered 2018-01-16 – 2018-01-18 (×6): 25 mg via ORAL
  Filled 2018-01-16 (×6): qty 1

## 2018-01-16 MED ORDER — LAMOTRIGINE 100 MG PO TABS
100.0000 mg | ORAL_TABLET | Freq: Two times a day (BID) | ORAL | Status: DC
  Administered 2018-01-16 – 2018-01-18 (×4): 100 mg via ORAL
  Filled 2018-01-16 (×4): qty 1

## 2018-01-16 MED ORDER — HEPARIN (PORCINE) IN NACL 100-0.45 UNIT/ML-% IJ SOLN
1150.0000 [IU]/h | INTRAMUSCULAR | Status: DC
  Administered 2018-01-16: 1000 [IU]/h via INTRAVENOUS
  Filled 2018-01-16: qty 250

## 2018-01-16 MED ORDER — SODIUM CHLORIDE 0.9% FLUSH: 3.0000 mL | INTRAVENOUS | Status: DC | PRN

## 2018-01-16 MED ORDER — ASPIRIN EC 81 MG PO TBEC
81.0000 mg | DELAYED_RELEASE_TABLET | Freq: Every day | ORAL | Status: DC
  Administered 2018-01-17 – 2018-01-18 (×2): 81 mg via ORAL
  Filled 2018-01-16 (×2): qty 1

## 2018-01-16 MED ORDER — ONDANSETRON HCL 4 MG PO TABS: 4.0000 mg | ORAL_TABLET | Freq: Three times a day (TID) | ORAL | Status: DC | PRN

## 2018-01-16 MED ORDER — RISPERIDONE 1 MG PO TABS
0.5000 mg | ORAL_TABLET | Freq: Every day | ORAL | Status: DC
  Administered 2018-01-16 – 2018-01-17 (×2): 0.5 mg via ORAL
  Filled 2018-01-16 (×3): qty 0.5

## 2018-01-16 MED ORDER — ACETAMINOPHEN 650 MG RE SUPP: 650.0000 mg | Freq: Four times a day (QID) | RECTAL | Status: DC | PRN

## 2018-01-16 MED ORDER — BISACODYL 5 MG PO TBEC: 10.0000 mg | DELAYED_RELEASE_TABLET | Freq: Every day | ORAL | Status: DC | PRN

## 2018-01-16 MED ORDER — LISINOPRIL 5 MG PO TABS
2.5000 mg | ORAL_TABLET | Freq: Every day | ORAL | Status: DC
  Administered 2018-01-17 – 2018-01-18 (×2): 2.5 mg via ORAL
  Filled 2018-01-16 (×2): qty 1

## 2018-01-16 MED ORDER — FUROSEMIDE 20 MG PO TABS
10.0000 mg | ORAL_TABLET | Freq: Every day | ORAL | Status: DC
  Administered 2018-01-17 – 2018-01-18 (×2): 10 mg via ORAL
  Filled 2018-01-16 (×2): qty 1

## 2018-01-16 MED ORDER — POLYETHYLENE GLYCOL 3350 17 G PO PACK
17.0000 g | PACK | Freq: Every day | ORAL | Status: DC
  Administered 2018-01-17 – 2018-01-18 (×2): 17 g via ORAL
  Filled 2018-01-16 (×2): qty 1

## 2018-01-16 MED ORDER — ACETAMINOPHEN 325 MG PO TABS: 650.0000 mg | ORAL_TABLET | Freq: Four times a day (QID) | ORAL | Status: DC | PRN

## 2018-01-16 MED ORDER — DILTIAZEM HCL ER COATED BEADS 120 MG PO CP24
120.0000 mg | ORAL_CAPSULE | Freq: Every day | ORAL | Status: DC
  Administered 2018-01-17 – 2018-01-18 (×2): 120 mg via ORAL
  Filled 2018-01-16 (×2): qty 1

## 2018-01-16 MED ORDER — OXYCODONE HCL ER 10 MG PO T12A
10.0000 mg | EXTENDED_RELEASE_TABLET | Freq: Two times a day (BID) | ORAL | Status: DC
  Administered 2018-01-16 – 2018-01-18 (×4): 10 mg via ORAL
  Filled 2018-01-16 (×4): qty 1

## 2018-01-16 MED ORDER — POTASSIUM CHLORIDE CRYS ER 20 MEQ PO TBCR
20.0000 meq | EXTENDED_RELEASE_TABLET | Freq: Every day | ORAL | Status: DC
  Administered 2018-01-17 – 2018-01-18 (×2): 20 meq via ORAL
  Filled 2018-01-16 (×2): qty 1

## 2018-01-16 MED ORDER — SODIUM CHLORIDE 0.9 % IV SOLN: 250.0000 mL | INTRAVENOUS | Status: DC | PRN

## 2018-01-16 MED ORDER — ONDANSETRON HCL 4 MG PO TABS: 4.0000 mg | ORAL_TABLET | Freq: Four times a day (QID) | ORAL | Status: DC | PRN

## 2018-01-16 MED ORDER — ATORVASTATIN CALCIUM 20 MG PO TABS
80.0000 mg | ORAL_TABLET | Freq: Every day | ORAL | Status: DC
  Administered 2018-01-16 – 2018-01-17 (×2): 80 mg via ORAL
  Filled 2018-01-16 (×2): qty 4

## 2018-01-16 MED ORDER — DIGOXIN 125 MCG PO TABS
0.1250 mg | ORAL_TABLET | Freq: Every day | ORAL | Status: DC
  Administered 2018-01-16 – 2018-01-18 (×3): 0.125 mg via ORAL
  Filled 2018-01-16 (×3): qty 1

## 2018-01-16 NOTE — H&P (Signed)
[place] [facility] [place] NAME: Voyd Groft    MR#:  [id number]  DATE OF BIRTH:  1934/06/30  DATE OF ADMISSION:  01/16/2018  PRIMARY CARE PHYSICIAN: System, Pcp Not In   REQUESTING/REFERRING PHYSICIAN: Orbie Pyo, MD  CHIEF COMPLAINT:   Chief Complaint  Patient presents with  . Chest Pain    HISTORY OF PRESENT ILLNESS: [name]  is a 83 y.o. male with a known history of atrial fibrillation, congestive heart failure, chronic kidney disease, diabetes type 2 essential hypertension, hyperlipidemia , peripheral vascular disease, sleep apnea does not use CPAP presenting to the hospital with complaint of chest pain.  Patient states that he was doing well up until this morning when he started having sharp pain in his chest in the center of the chest feeling like indigestion.  Patient's EKG was nondiagnostic.  He had a troponin initially was negative however subsequent troponin increased.  The ER physician discussed with cardiology who recommended starting patient on heparin.  Patient denies any shortness of breath, no radiation of the pain no tingling or numbness.    PAST MEDICAL HISTORY:   Past Medical History:  Diagnosis Date  . Arthritis   . Atrial fibrillation ([place])   . Carcinoma of prostate ([place])   . CHF (congestive heart failure) ([place])   . Chronic kidney disease   . Diabetes mellitus without complication ([name])   . ED (erectile dysfunction)   . Frequent urination   . Hyperlipidemia   . Hypertension   . Moderate mitral insufficiency   . Peripheral vascular disease ([place])   . Pneumonia 04/2016  . Prostate cancer ([place])   . Sleep apnea    OSA--USE C-PAP  . Ulcer of left foot due to type 2 diabetes mellitus ([place])   . Urinary stress incontinence, male     PAST SURGICAL HISTORY:  Past Surgical History:  Procedure Laterality Date  . AMPUTATION Left 01/12/2017   Procedure: AMPUTATION BELOW KNEE;  Surgeon: Algernon Huxley, MD;   Location: [facility];  Service: General;  Laterality: Left;  . APLIGRAFT PLACEMENT Left 10/15/2016   Procedure: APLIGRAFT PLACEMENT;  Surgeon: Albertine Patricia, DPM;  Location: [facility];  Service: Podiatry;  Laterality: Left;  necrotic ulcer  . CHOLECYSTECTOMY    . EYE SURGERY Bilateral    Cataract Extraction with IOL  . IRRIGATION AND DEBRIDEMENT FOOT Left 10/15/2016   Procedure: IRRIGATION AND DEBRIDEMENT FOOT-EXCISIONAL DEBRIDEMENT OF SKIN 3RD, 4TH AND 5TH TOES WITH APPLICATION OF APLIGRAFT;  Surgeon: Albertine Patricia, DPM;  Location: [facility];  Service: Podiatry;  Laterality: Left;  necrotic,gangrene  . IRRIGATION AND DEBRIDEMENT FOOT Left 12/09/2016   Procedure: IRRIGATION AND DEBRIDEMENT FOOT-MUSCLE/FASCIA, RESECTION OF FOURTH AND FIFTH METATARSAL NECROTIC BONE;  Surgeon: Albertine Patricia, DPM;  Location: [facility];  Service: Podiatry;  Laterality: Left;  . LOWER EXTREMITY ANGIOGRAPHY Left 08/16/2016   Procedure: Lower Extremity Angiography;  Surgeon: Algernon Huxley, MD;  Location: [place] [facility];  Service: Cardiovascular;  Laterality: Left;  . LOWER EXTREMITY ANGIOGRAPHY Left 09/01/2016   Procedure: Lower Extremity Angiography;  Surgeon: Algernon Huxley, MD;  Location: [place] [facility];  Service: Cardiovascular;  Laterality: Left;  . LOWER EXTREMITY ANGIOGRAPHY Left 10/14/2016   Procedure: Lower Extremity Angiography;  Surgeon: Algernon Huxley, MD;  Location: [place] [facility];  Service: Cardiovascular;  Laterality: Left;  . LOWER EXTREMITY ANGIOGRAPHY Left 12/20/2016   Procedure: Lower Extremity Angiography;  Surgeon: Algernon Huxley, MD;  Location: [place] [facility];  Service: Cardiovascular;  Laterality: Left;  . LOWER EXTREMITY INTERVENTION  09/01/2016   Procedure: Lower Extremity Intervention;  Surgeon: Algernon Huxley, MD;  Location: [place] [facility];  Service: Cardiovascular;;  . PROSTATE SURGERY     removal  . TONSILLECTOMY     as a child    SOCIAL HISTORY:  Social History    Tobacco Use  . Smoking status: Former Smoker    Packs/day: 0.25    Types: Cigarettes    Last attempt to quit: 10/14/1994    Years since quitting: 23.2  . Smokeless tobacco: Never Used  Substance Use Topics  . Alcohol use: No    Alcohol/week: 0.0 standard drinks    FAMILY HISTORY:  Family History  Problem Relation Age of Onset  . Hypertension Mother   . Diabetes Mother   . Prostate cancer Neg Hx   . Bladder Cancer Neg Hx   . Kidney disease Neg Hx     DRUG ALLERGIES:  Allergies  Allergen Reactions  . Contrast Media [Iodinated Diagnostic Agents] Shortness Of Breath    SOB  . Iohexol Shortness Of Breath     Desc: Respiratory Distress, Laryngedema, diaphoresis, Onset Date: 88416606   . Metrizamide Shortness Of Breath    SOB SOB SOB   . Latex Rash    REVIEW OF SYSTEMS:   CONSTITUTIONAL: No fever, fatigue or weakness.  EYES: No blurred or double vision.  EARS, NOSE, AND THROAT: No tinnitus or ear pain.  RESPIRATORY: No cough, shortness of breath, wheezing or hemoptysis.  CARDIOVASCULAR: Positive chest pain, orthopnea, edema.  GASTROINTESTINAL: No nausea, vomiting, diarrhea or abdominal pain.  GENITOURINARY: No dysuria, hematuria.  ENDOCRINE: No polyuria, nocturia,  HEMATOLOGY: No anemia, easy bruising or bleeding SKIN: No rash or lesion. MUSCULOSKELETAL: No joint pain or arthritis.   NEUROLOGIC: No tingling, numbness, weakness.  PSYCHIATRY: No anxiety or depression.   MEDICATIONS AT HOME:  Prior to Admission medications   Medication Sig Start Date End Date Taking? Authorizing Provider  aspirin EC 81 MG tablet Take 81 mg by mouth daily.    [provider]  atorvastatin (LIPITOR) 80 MG tablet Take 80 mg by mouth daily at 6 PM.     [provider]  bisacodyl (DULCOLAX) 5 MG EC tablet Take 10 mg by mouth daily as needed for moderate constipation.    [provider]  collagenase (SANTYL) ointment Apply topically daily. Right leg wound  11/28/17   Loletha Grayer, MD  digoxin (LANOXIN) 0.125 MG tablet Take 1 tablet (0.125 mg total) by mouth daily. 01/20/17   Henreitta Leber, MD  diltiazem (CARDIZEM CD) 120 MG 24 hr capsule Take 1 capsule (120 mg total) by mouth daily. 11/28/17   Loletha Grayer, MD  furosemide (LASIX) 20 MG tablet Take 0.5 tablets (10 mg total) by mouth daily. 11/28/17   Loletha Grayer, MD  insulin glargine (LANTUS) 100 UNIT/ML injection Inject 0.1 mLs (10 Units total) into the skin at bedtime. 10/28/17   Bettey Costa, MD  insulin lispro (HUMALOG) 100 UNIT/ML injection Inject 0.03 mLs (3 Units total) into the skin 3 (three) times daily with meals. 11/28/17   Loletha Grayer, MD  lamoTRIgine (LAMICTAL) 25 MG tablet Take 100 mg by mouth 2 (two) times daily.  04/20/17   [provider]  lisinopril (PRINIVIL,ZESTRIL) 2.5 MG tablet Take 1 tablet (2.5 mg total) by mouth daily. 11/28/17 11/28/18  Loletha Grayer, MD  metoprolol tartrate (LOPRESSOR) 25 MG tablet Take  1 tablet (25 mg total) by mouth 3 (three) times daily. 11/28/17   Loletha Grayer, MD  ondansetron (ZOFRAN) 4 MG tablet Take 4 mg by mouth every 8 (eight) hours as needed for nausea or vomiting.    [provider]  oxyCODONE (OXYCONTIN) 10 mg 12 hr tablet Take 1 tablet (10 mg total) by mouth every 12 (twelve) hours. 11/28/17   Loletha Grayer, MD  polyethylene glycol (MIRALAX / GLYCOLAX) packet Take 17 g by mouth daily. 11/28/17   Loletha Grayer, MD  potassium chloride SA (K-DUR,KLOR-CON) 20 MEQ tablet Take 20 mEq by mouth daily.    [provider]  risperiDONE (RISPERDAL) 0.5 MG tablet Take 1 tablet (0.5 mg total) by mouth at bedtime. 10/28/17   Bettey Costa, MD  rivaroxaban (XARELTO) 20 MG TABS tablet Take 1 tablet (20 mg total) by mouth daily with supper. 10/28/17   Bettey Costa, MD      PHYSICAL EXAMINATION:   VITAL SIGNS: Blood pressure (!) 150/69, pulse 90, temperature 98.2 F (36.8 C), temperature source Oral, resp. rate (!) 23, height 5'  7" (1.702 m), weight 81.6 kg, SpO2 99 %.  GENERAL:  83 y.o.-year-old patient lying in the bed with no acute distress.  EYES: Pupils equal, round, reactive to light and accommodation. No scleral icterus. Extraocular muscles intact.  HEENT: Head atraumatic, normocephalic. Oropharynx and nasopharynx clear.  NECK:  Supple, no jugular venous distention. No thyroid enlargement, no tenderness.  LUNGS: Normal breath sounds bilaterally, no wheezing, rales,rhonchi or crepitation. No use of accessory muscles of respiration.  CARDIOVASCULAR: S1, S2 normal. No murmurs, rubs, or gallops.  ABDOMEN: Soft, nontender, nondistended. Bowel sounds present. No organomegaly or mass.  EXTREMITIES: No pedal edema, cyanosis, or clubbing.  NEUROLOGIC: Cranial nerves II through XII are intact. Muscle strength 5/5 in all extremities. Sensation intact. Gait not checked.  PSYCHIATRIC: The patient is alert and oriented x 3.  SKIN: No obvious rash, lesion, or ulcer.   LABORATORY PANEL:   CBC Recent Labs  Lab 01/16/18 1034  WBC 7.9  HGB 14.1  HCT 41.8  PLT 232  MCV 85.1  MCH 28.7  MCHC 33.8  RDW 14.4   ------------------------------------------------------------------------------------------------------------------  Chemistries  Recent Labs  Lab 01/16/18 1034  NA 139  K 4.1  CL 105  CO2 27  GLUCOSE 232*  BUN 26*  CREATININE 0.97  CALCIUM 9.0   ------------------------------------------------------------------------------------------------------------------ estimated creatinine clearance is 62.1 mL/min (by C-G formula based on SCr of 0.97 mg/dL). ------------------------------------------------------------------------------------------------------------------ No results for input(s): TSH, T4TOTAL, T3FREE, THYROIDAB in the last 72 hours.  Invalid input(s): FREET3   Coagulation profile No results for input(s): INR, PROTIME in the last 168  hours. ------------------------------------------------------------------------------------------------------------------- No results for input(s): DDIMER in the last 72 hours. -------------------------------------------------------------------------------------------------------------------  Cardiac Enzymes Recent Labs  Lab 01/16/18 1034 01/16/18 1334  TROPONINI 0.04* 0.29*   ------------------------------------------------------------------------------------------------------------------ Invalid input(s): POCBNP  ---------------------------------------------------------------------------------------------------------------  Urinalysis    Component Value Date/Time   COLORURINE AMBER (A) 10/23/2017 1315   APPEARANCEUR CLOUDY (A) 10/23/2017 1315   LABSPEC 1.023 10/23/2017 1315   PHURINE 5.0 10/23/2017 1315   GLUCOSEU 50 (A) 10/23/2017 1315   HGBUR LARGE (A) 10/23/2017 1315   BILIRUBINUR NEGATIVE 10/23/2017 1315   KETONESUR NEGATIVE 10/23/2017 1315   PROTEINUR 30 (A) 10/23/2017 1315   NITRITE NEGATIVE 10/23/2017 1315   LEUKOCYTESUR NEGATIVE 10/23/2017 1315     RADIOLOGY: Dg Chest 2 View  Result Date: 01/16/2018 CLINICAL DATA:  Chest pain and nausea for several hours EXAM: CHEST - 2  VIEW COMPARISON:  11/23/2017 FINDINGS: Cardiac shadow is at the upper limits of normal in size. Aortic calcifications are noted. Mild interstitial changes are seen consistent with a degree of interstitial edema. No sizable effusion is seen. No focal infiltrate is noted. IMPRESSION: Mild interstitial edema.  No acute infiltrate is noted. Electronically Signed   By: Inez Catalina M.D.   On: 01/16/2018 11:05    EKG: Orders placed or performed during the hospital encounter of 01/16/18  . EKG 12-Lead  . EKG 12-Lead  . ED EKG within 10 minutes  . ED EKG within 10 minutes    IMPRESSION AND PLAN: Patient is a 83 year old with history of atrial fibrillation congestive heart failure, peripheral vascular  disease, diabetes type 2, sleep apnea and multiple other medical problems presenting with chest pain  1.  Chest pain due to non-ST MI Continue heparin drip as started by the ED physician Cardiology consult Continue aspirin Xarelto will be held Continue Lipitor  2.  Atrial fibrillation continue digoxin, Cardizem and metoprolol hold Xarelto as above  3.  Diabetes type 2 continue Lantus placed on sliding scale insulin  4.  Hyperlipidemia continue Lipitor  5.  Peripheral vascular disease  6.  Miscellaneous heparin for DVT prophylaxis  All the records are reviewed and case discussed with ED provider. Management plans discussed with the patient, family and they are in agreement.  CODE STATUS: Code Status History    Date Active Date Inactive Code Status Order ID Comments User Context   11/23/2017 1346 11/28/2017 2020 Full Code [id number]  Nicholes Mango, MD Inpatient   10/26/2017 1534 10/28/2017 1854 Full Code [id number]  Bettey Costa, MD Inpatient   10/26/2017 1437 10/26/2017 1534 Full Code [id number]  Feliberto Gottron, RN Inpatient   10/24/2017 1152 10/26/2017 1437 DNR [id number]  Bettey Costa, MD Inpatient   10/15/2017 0309 10/24/2017 1152 Full Code [id number]  Arta Silence, MD Inpatient   02/10/2017 1904 02/12/2017 1908 Full Code [id number]  Schnier, Dolores Lory, MD ED   02/10/2017 1638 02/10/2017 1904 Full Code [id number]  Sela Hua, PA-C Outpatient   01/08/2017 1342 01/19/2017 2103 Full Code [id number]  Loletha Grayer, MD ED   12/29/2016 2232 12/30/2016 1634 Full Code [id number]  Lance Coon, MD ED   12/20/2016 1251 12/20/2016 1842 Full Code [id number]  Algernon Huxley, MD Inpatient   10/22/2016 0040 10/24/2016 2058 Full Code [id number]  Lance Coon, MD Inpatient   10/14/2016 1201 10/14/2016 1925 Full Code [id number]  Algernon Huxley, MD Inpatient   08/30/2016 1338 09/06/2016 1941 Full Code [id number]  Epifanio Lesches, MD Inpatient   05/19/2016 0454 05/22/2016 1907 Full Code [id number]  Saundra Shelling, MD  Inpatient       TOTAL TIME TAKING CARE OF THIS PATIENT: 55  minutes.    Dustin Flock M.D on 01/16/2018 at 2:51 PM  Between 7am to 6pm - Pager - [phone]  After 6pm go to [email or web address] - password [email or web address] [facility]  [facility] [facility]  [phone]  CC: Primary care physician; System, Pcp Not In

## 2018-01-16 NOTE — ED Triage Notes (Signed)
Pt reports this am started with CP and nausea. Pt reports pain is gone now but points to the epigastric area as location of pain. Pt reports he took 4 aspirin for the pain.

## 2018-01-16 NOTE — ED Provider Notes (Signed)
[name] Emergency Department Provider Note ____________________________________________   First MD Initiated Contact with Patient 01/16/18 1141     (approximate)  I have reviewed the triage vital signs and the nursing notes.   HISTORY  Chief Complaint Chest Pain  HPI [name] is a 83 y.o. male with a history of atrial fibrillation as well as CHF on digoxin who is presenting to the emergency department today with one episode of chest pain earlier today.  He says that the pain was central in the chest without any radiation.  He says that it felt like "indigestion."  He took 4 aspirin and then came to the hospital for further evaluation.  He is pain-free at this time.  Denies any nausea, vomiting or shortness of breath.  Says that he was at rest when the symptoms started.  Denies history of coronary artery disease or stents.  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  Past Medical History:  Diagnosis Date  . Arthritis   . Atrial fibrillation ([place])   . Carcinoma of prostate ([place])   . CHF (congestive heart failure) ([place])   . Chronic kidney disease   . Diabetes mellitus without complication ([place])   . ED (erectile dysfunction)   . Frequent urination   . Hyperlipidemia   . Hypertension   . Moderate mitral insufficiency   . Peripheral vascular disease ([place])   . Pneumonia 04/2016  . Prostate cancer ([place])   . Sleep apnea    OSA--USE C-PAP  . Ulcer of left foot due to type 2 diabetes mellitus ([place])   . Urinary stress incontinence, male     Patient Active Problem List   Diagnosis Date Noted  . Acute CHF (congestive heart failure) ([place]) 11/23/2017  . Atherosclerosis of native arteries of the extremities with ulceration ([place]) 11/18/2017  . Atrial fibrillation with rapid ventricular response ([place]) 10/15/2017  . BKA stump complication ([place]) 48/54/6270  . 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 S/P BKA (below  knee amputation) unilateral, left ([place]) 02/01/2017  . Chronic diastolic heart failure ([place]) 01/21/2017  . Pressure injury of skin 01/18/2017  . Atherosclerosis of artery of extremity with gangrene ([place]) 01/05/2017  . Diabetic foot ulcer ([place]) 12/29/2016  . Ataxia 10/21/2016  . History of femoral angiogram 09/06/2016  . Left leg pain 09/06/2016  . Leukocytosis 09/06/2016  . Generalized weakness 09/06/2016  . A-fib ([place]) 08/30/2016  . Pneumonia 05/19/2016  . Hyperlipidemia 04/20/2016  . Back pain 04/19/2016  . Type 2 diabetes mellitus treated with insulin ([place]) 04/19/2016  . Essential hypertension 04/19/2016  . PAD (peripheral artery disease) ([place]) 04/19/2016  . Erectile dysfunction following radical prostatectomy 06/25/2015  . History of prostate cancer 12/23/2014  . Incontinence 12/23/2014    Past Surgical History:  Procedure Laterality Date  . AMPUTATION Left 01/12/2017   Procedure: AMPUTATION BELOW KNEE;  Surgeon: Algernon Huxley, MD;  Location: [facility] ORS;  Service: General;  Laterality: Left;  . APLIGRAFT PLACEMENT Left 10/15/2016   Procedure: APLIGRAFT PLACEMENT;  Surgeon: Albertine Patricia, DPM;  Location: [facility] ORS;  Service: Podiatry;  Laterality: Left;  necrotic ulcer  . CHOLECYSTECTOMY    . EYE SURGERY Bilateral    Cataract Extraction with IOL  . IRRIGATION AND DEBRIDEMENT FOOT Left 10/15/2016   Procedure: IRRIGATION AND DEBRIDEMENT FOOT-EXCISIONAL DEBRIDEMENT OF SKIN 3RD, 4TH AND 5TH TOES WITH APPLICATION OF APLIGRAFT;  Surgeon: Albertine Patricia, DPM;  Location: [facility] ORS;  Service:  Podiatry;  Laterality: Left;  necrotic,gangrene  . IRRIGATION AND DEBRIDEMENT FOOT Left 12/09/2016   Procedure: IRRIGATION AND DEBRIDEMENT FOOT-MUSCLE/FASCIA, RESECTION OF FOURTH AND FIFTH METATARSAL NECROTIC BONE;  Surgeon: Albertine Patricia, DPM;  Location: [facility] ORS;  Service: Podiatry;  Laterality: Left;  . LOWER EXTREMITY ANGIOGRAPHY Left 08/16/2016   Procedure: Lower Extremity Angiography;  Surgeon:  Algernon Huxley, MD;  Location: [place] [facility];  Service: Cardiovascular;  Laterality: Left;  . LOWER EXTREMITY ANGIOGRAPHY Left 09/01/2016   Procedure: Lower Extremity Angiography;  Surgeon: Algernon Huxley, MD;  Location: [place] [facility];  Service: Cardiovascular;  Laterality: Left;  . LOWER EXTREMITY ANGIOGRAPHY Left 10/14/2016   Procedure: Lower Extremity Angiography;  Surgeon: Algernon Huxley, MD;  Location: [place] [facility];  Service: Cardiovascular;  Laterality: Left;  . LOWER EXTREMITY ANGIOGRAPHY Left 12/20/2016   Procedure: Lower Extremity Angiography;  Surgeon: Algernon Huxley, MD;  Location: [place] [facility];  Service: Cardiovascular;  Laterality: Left;  . LOWER EXTREMITY INTERVENTION  09/01/2016   Procedure: Lower Extremity Intervention;  Surgeon: Algernon Huxley, MD;  Location: [place] [facility];  Service: Cardiovascular;;  . PROSTATE SURGERY     removal  . TONSILLECTOMY     as a child    Prior to Admission medications   Medication Sig Start Date End Date Taking? Authorizing Provider  aspirin EC 81 MG tablet Take 81 mg by mouth daily.    [provider]  atorvastatin (LIPITOR) 80 MG tablet Take 80 mg by mouth daily at 6 PM.     [provider]  bisacodyl (DULCOLAX) 5 MG EC tablet Take 10 mg by mouth daily as needed for moderate constipation.    [provider]  collagenase (SANTYL) ointment Apply topically daily. Right leg wound 11/28/17   Loletha Grayer, MD  digoxin (LANOXIN) 0.125 MG tablet Take 1 tablet (0.125 mg total) by mouth daily. 01/20/17   Henreitta Leber, MD  diltiazem (CARDIZEM CD) 120 MG 24 hr capsule Take 1 capsule (120 mg total) by mouth daily. 11/28/17   Loletha Grayer, MD  furosemide (LASIX) 20 MG tablet Take 0.5 tablets (10 mg total) by mouth daily. 11/28/17   Loletha Grayer, MD  insulin glargine (LANTUS) 100 UNIT/ML injection Inject 0.1 mLs (10 Units total) into the skin at bedtime. 10/28/17   Bettey Costa, MD  insulin lispro (HUMALOG)  100 UNIT/ML injection Inject 0.03 mLs (3 Units total) into the skin 3 (three) times daily with meals. 11/28/17   Loletha Grayer, MD  lamoTRIgine (LAMICTAL) 25 MG tablet Take 100 mg by mouth 2 (two) times daily.  04/20/17   [provider]  lisinopril (PRINIVIL,ZESTRIL) 2.5 MG tablet Take 1 tablet (2.5 mg total) by mouth daily. 11/28/17 11/28/18  Loletha Grayer, MD  metoprolol tartrate (LOPRESSOR) 25 MG tablet Take 1 tablet (25 mg total) by mouth 3 (three) times daily. 11/28/17   Loletha Grayer, MD  ondansetron (ZOFRAN) 4 MG tablet Take 4 mg by mouth every 8 (eight) hours as needed for nausea or vomiting.    [provider]  oxyCODONE (OXYCONTIN) 10 mg 12 hr tablet Take 1 tablet (10 mg total) by mouth every 12 (twelve) hours. 11/28/17   Loletha Grayer, MD  polyethylene glycol (MIRALAX / GLYCOLAX) packet Take 17 g by mouth daily. 11/28/17   Loletha Grayer, MD  potassium chloride SA (K-DUR,KLOR-CON) 20 MEQ tablet Take 20 mEq by mouth daily.    [provider]  risperiDONE (RISPERDAL) 0.5 MG tablet  Take 1 tablet (0.5 mg total) by mouth at bedtime. 10/28/17   Bettey Costa, MD  rivaroxaban (XARELTO) 20 MG TABS tablet Take 1 tablet (20 mg total) by mouth daily with supper. 10/28/17   Bettey Costa, MD    Allergies Contrast media [iodinated diagnostic agents]; Iohexol; Metrizamide; and Latex  Family History  Problem Relation Age of Onset  . Hypertension Mother   . Diabetes Mother   . Prostate cancer Neg Hx   . Bladder Cancer Neg Hx   . Kidney disease Neg Hx     Social History Social History   Tobacco Use  . Smoking status: Former Smoker    Packs/day: 0.25    Types: Cigarettes    Last attempt to quit: 10/14/1994    Years since quitting: 23.2  . Smokeless tobacco: Never Used  Substance Use Topics  . Alcohol use: No    Alcohol/week: 0.0 standard drinks  . Drug use: No    Review of Systems  Constitutional: No fever/chills Eyes: No visual changes. ENT: No sore  throat. Cardiovascular: As above Respiratory: Denies shortness of breath. Gastrointestinal: No abdominal pain.  No nausea, no vomiting.  No diarrhea.  No constipation. Genitourinary: Negative for dysuria. Musculoskeletal: Negative for back pain. Skin: Negative for rash. Neurological: Negative for headaches, focal weakness or numbness.   ____________________________________________   PHYSICAL EXAM:  VITAL SIGNS: ED Triage Vitals  Enc Vitals Group     BP 01/16/18 1031 (!) 157/94     Pulse Rate 01/16/18 1031 100     Resp 01/16/18 1031 20     Temp 01/16/18 1031 98.2 F (36.8 C)     Temp Source 01/16/18 1031 Oral     SpO2 01/16/18 1031 95 %     Weight 01/16/18 1029 180 lb (81.6 kg)     Height 01/16/18 1029 5\' 7"  (1.702 m)     Head Circumference --      Peak Flow --      Pain Score 01/16/18 1029 0     Pain Loc --      Pain Edu? --      Excl. in [place]? --     Constitutional: Alert and oriented. Well appearing and in no acute distress. Eyes: Conjunctivae are normal.  Head: Atraumatic. Nose: No congestion/rhinnorhea. Mouth/Throat: Mucous membranes are moist.  Neck: No stridor.   Cardiovascular: Normal rate, regular rhythm. Grossly normal heart sounds.  Respiratory: Normal respiratory effort.  No retractions. Lungs CTAB. Gastrointestinal: Soft and nontender. No distention. No CVA tenderness. Musculoskeletal: Left-sided BKA.  No peripheral edema to the right. Neurologic:  Normal speech and language. No gross focal neurologic deficits are appreciated. Skin: 2 x 3 cm ulcerated lesion to right, medial calf which has a Vaseline dressing directly overlying it and that is covered with gauze.  There is no surrounding erythema, induration or tenderness.  No draining exudate. Psychiatric: Mood and affect are normal. Speech and behavior are normal.  ____________________________________________   LABS (all labs ordered are listed, but only abnormal results are displayed)  Labs Reviewed   BASIC METABOLIC PANEL - Abnormal; Notable for the following components:      Result Value   Glucose, Bld 232 (*)    BUN 26 (*)    All other components within normal limits  TROPONIN I - Abnormal; Notable for the following components:   Troponin I 0.04 (*)    All other components within normal limits  DIGOXIN LEVEL - Abnormal; Notable for the following components:  Digoxin Level 0.4 (*)    All other components within normal limits  BRAIN NATRIURETIC PEPTIDE - Abnormal; Notable for the following components:   B Natriuretic Peptide 920.0 (*)    All other components within normal limits  TROPONIN I - Abnormal; Notable for the following components:   Troponin I 0.29 (*)    All other components within normal limits  CBC   ____________________________________________  EKG  ED ECG REPORT I, Doran Stabler, the attending physician, personally viewed and interpreted this ECG.   Date: 01/16/2018  EKG Time: 1030  Rate: 106  Rhythm: atrial fibrillation, rate 106.  PVC x2.  Axis: Normal  Intervals:none  ST&T Change: T wave inversions in 1, 2, 3, aVF, V4 through 6.  Minimal elevation in aVR, V1 and V2.  There is no significant change from previous. ____________________________________________  RADIOLOGY  Mild interstitial edema without acute infiltrate ____________________________________________   PROCEDURES  Procedure(s) performed:   .Critical Care Performed by: Orbie Pyo, MD Authorized by: Orbie Pyo, MD   Critical care provider statement:    Critical care time (minutes):  35   Critical care time was exclusive of:  Separately billable procedures and treating other patients   Critical care was necessary to treat or prevent imminent or life-threatening deterioration of the following conditions:  Cardiac failure   Critical care was time spent personally by me on the following activities:  Development of treatment plan with patient or surrogate,  discussions with consultants, evaluation of patient's response to treatment, examination of patient, obtaining history from patient or surrogate, ordering and performing treatments and interventions, ordering and review of laboratory studies, ordering and review of radiographic studies, pulse oximetry, re-evaluation of patient's condition and review of old charts    Critical Care performed:   ____________________________________________   INITIAL IMPRESSION / ASSESSMENT AND PLAN / ED COURSE  Pertinent labs & imaging results that were available during my care of the patient were reviewed by me and considered in my medical decision making (see chart for details).  Differential diagnosis includes, but is not limited to, ACS, aortic dissection, pulmonary embolism, cardiac tamponade, pneumothorax, pneumonia, pericarditis, myocarditis, GI-related causes including esophagitis/gastritis, and musculoskeletal chest wall pain.   As part of my medical decision making, I reviewed the following data within the electronic MEDICAL RECORD NUMBER Notes from prior ED visits  ----------------------------------------- 2:44 PM on 01/16/2018 -----------------------------------------  Patient at this time remains chest pain-free.  Troponin has gone from 0.04-0.29.  Discussed the case with Dr. Saralyn Pilar who is also reviewed the patient's EKG.  He does not recommend emergent catheterization but does recommend heparinization.  He says that he will likely take the patient to catheterization in a day or 2 after the Xarelto metabolizes.  Patient aware of the elevated troponin and need for admission to the hospital understands and willing to comply. ____________________________________________   FINAL CLINICAL IMPRESSION(S) / ED DIAGNOSES  NSTEMI    NEW MEDICATIONS STARTED DURING THIS VISIT:  New Prescriptions   No medications on file     Note:  This document was prepared using Dragon voice recognition software  and may include unintentional dictation errors.     Orbie Pyo, MD 01/16/18 [phone]

## 2018-01-16 NOTE — Progress Notes (Signed)
[facility] for a heparin drip Indication: chest pain/ACS  Allergies  Allergen Reactions  . Contrast Media [Iodinated Diagnostic Agents] Shortness Of Breath    SOB  . Iohexol Shortness Of Breath     Desc: Respiratory Distress, Laryngedema, diaphoresis, Onset Date: [id number]   . Metrizamide Shortness Of Breath    SOB SOB SOB   . Latex Rash    Patient Measurements: Height: 5\' 7"  (170.2 cm) Weight: 180 lb (81.6 kg) IBW/kg (Calculated) : 66.1  Vital Signs: Temp: 97.9 F (36.6 C) (08/26 1638) Temp Source: Oral (08/26 1638) BP: 168/92 (08/26 1638) Pulse Rate: 89 (08/26 1638)  Labs: Recent Labs    01/16/18 1034 01/16/18 1334  HGB 14.1  --   HCT 41.8  --   PLT 232  --   CREATININE 0.97  --   TROPONINI 0.04* 0.29*    Estimated Creatinine Clearance: 62.1 mL/min (by C-G formula based on SCr of 0.97 mg/dL).   Medical History: Past Medical History:  Diagnosis Date  . Arthritis   . Atrial fibrillation ([place])   . Carcinoma of prostate ([place])   . CHF (congestive heart failure) ([place])   . Chronic kidney disease   . Diabetes mellitus without complication ([place])   . ED (erectile dysfunction)   . Frequent urination   . Hyperlipidemia   . Hypertension   . Moderate mitral insufficiency   . Peripheral vascular disease ([place])   . Pneumonia 04/2016  . Prostate cancer ([place])   . Sleep apnea    OSA--USE C-PAP  . Ulcer of left foot due to type 2 diabetes mellitus ([place])   . Urinary stress incontinence, male     Assessment: 83 year-old male w/ PMH of atrial fibrillation, congestive heart failure, chronic kidney disease, diabetes type 2 essential hypertension, hyperlipidemia , peripheral vascular disease, sleep apnea presenting to the hospital with complaint of chest pain. Patient's EKG was nondiagnostic.  He had a troponin initially was negative however subsequent troponin increased.  The ER physician discussed with cardiology who recommended  starting patient on heparin. The patient is on Xarelto at home and, based on my conversation with him, his last dose was this morning between 6-7am. I discussed this with Dr Posey Pronto and he wishes to proceed with heparin therapy.  Goal of Therapy:   Monitor platelets by anticoagulation protocol: Yes   Plan:  Start heparin infusion at 1000 units/hr with no bolus given recent Xarelto administration Check anti-Xa level and aPTT in 8 hours. We will most likely need to follow aPTT until HL and aPTT correlate. and daily  Continue to monitor H&H and platelets  Dallie Piles, PharmD 01/16/2018,4:57 PM

## 2018-01-16 NOTE — ED Notes (Signed)
Attempt at 2nd IV unsuccessful. Was able to collect blue top to send to the lab.

## 2018-01-16 NOTE — ED Notes (Signed)
Date and time results received: 01/16/18 1430 (use smartphrase ".now" to insert current time)  Test: troponin Critical Value: 0.29  Name of Provider Notified: Schavetiz  Orders Received? Or Actions Taken?: Orders Received - See Orders for details

## 2018-01-16 NOTE — Progress Notes (Signed)
Advanced care plan.  Purpose of the Encounter: CODE STATUS  Parties in Attendance: patient  Patient's [name]  is a 83 y.o. male with a known history of atrial fibrillation, congestive heart failure, chronic kidney disease, diabetes type 2 essential hypertension, hyperlipidemia , peripheral vascular disease, sleep apnea does not use CPAP presenting to the hospital with complaint of chest pain.      Objective/Medical story Patient states that he would like everything done including CPR and intubation   Goals of care determination:  Full code   CODE STATUS: Full code   Time spent discussing advanced care planning: 16 minutes

## 2018-01-16 NOTE — ED Notes (Signed)
Report given to Sherrie RN 

## 2018-01-17 LAB — CBC
HEMATOCRIT: 36.9 % — AB (ref 40.0–52.0)
HEMOGLOBIN: 12.5 g/dL — AB (ref 13.0–18.0)
MCH: 28.7 pg (ref 26.0–34.0)
MCHC: 33.9 g/dL (ref 32.0–36.0)
MCV: 84.7 fL (ref 80.0–100.0)
Platelets: 211 10*3/uL (ref 150–440)
RBC: 4.36 MIL/uL — ABNORMAL LOW (ref 4.40–5.90)
RDW: 14.1 % (ref 11.5–14.5)
WBC: 7.5 10*3/uL (ref 3.8–10.6)

## 2018-01-17 LAB — BASIC METABOLIC PANEL
Anion gap: 7 (ref 5–15)
BUN: 22 mg/dL (ref 8–23)
CHLORIDE: 106 mmol/L (ref 98–111)
CO2: 25 mmol/L (ref 22–32)
Calcium: 8.4 mg/dL — ABNORMAL LOW (ref 8.9–10.3)
Creatinine, Ser: 0.91 mg/dL (ref 0.61–1.24)
GFR calc non Af Amer: >60 mL/min (ref >60)
Glucose, Bld: 188 mg/dL — ABNORMAL HIGH (ref 70–99)
POTASSIUM: 3.5 mmol/L (ref 3.5–5.1)
Sodium: 138 mmol/L (ref 135–145)

## 2018-01-17 LAB — HEPARIN LEVEL (UNFRACTIONATED)
HEPARIN UNFRACTIONATED: 0.44 [IU]/mL (ref 0.30–0.70)
Heparin Unfractionated: 0.26 IU/mL — ABNORMAL LOW (ref 0.30–0.70)
Heparin Unfractionated: 0.62 IU/mL (ref 0.30–0.70)

## 2018-01-17 LAB — TROPONIN I: Troponin I: 0.46 ng/mL (ref <0.03)

## 2018-01-17 LAB — APTT
APTT: 60 s — AB (ref 24–36)
APTT: 58 s — AB (ref 24–36)
aPTT: 34 seconds (ref 24–36)

## 2018-01-17 LAB — GLUCOSE, CAPILLARY
GLUCOSE-CAPILLARY: 139 mg/dL — AB (ref 70–99)
Glucose-Capillary: 123 mg/dL — ABNORMAL HIGH (ref 70–99)

## 2018-01-17 MED ORDER — INSULIN ASPART 100 UNIT/ML ~~LOC~~ SOLN
3.0000 [IU] | Freq: Three times a day (TID) | SUBCUTANEOUS | Status: DC
  Administered 2018-01-17 – 2018-01-18 (×3): 3 [IU] via SUBCUTANEOUS
  Filled 2018-01-17 (×4): qty 1

## 2018-01-17 MED ORDER — ISOSORBIDE MONONITRATE ER 30 MG PO TB24
30.0000 mg | ORAL_TABLET | Freq: Every day | ORAL | Status: DC
  Administered 2018-01-17 – 2018-01-18 (×2): 30 mg via ORAL
  Filled 2018-01-17 (×2): qty 1

## 2018-01-17 MED ORDER — HEPARIN BOLUS VIA INFUSION
1000.0000 [IU] | Freq: Once | INTRAVENOUS | Status: AC
  Administered 2018-01-17: 1000 [IU] via INTRAVENOUS
  Filled 2018-01-17: qty 1000

## 2018-01-17 MED ORDER — COLLAGENASE 250 UNIT/GM EX OINT
TOPICAL_OINTMENT | Freq: Every day | CUTANEOUS | Status: DC
  Administered 2018-01-17 – 2018-01-18 (×2): via TOPICAL
  Filled 2018-01-17: qty 30

## 2018-01-17 MED ORDER — INSULIN ASPART 100 UNIT/ML ~~LOC~~ SOLN
0.0000 [IU] | Freq: Three times a day (TID) | SUBCUTANEOUS | Status: DC
  Administered 2018-01-17: 1 [IU] via SUBCUTANEOUS
  Administered 2018-01-18: 5 [IU] via SUBCUTANEOUS
  Filled 2018-01-17: qty 1

## 2018-01-17 MED ORDER — INSULIN ASPART 100 UNIT/ML ~~LOC~~ SOLN: 0.0000 [IU] | Freq: Every day | SUBCUTANEOUS | Status: DC

## 2018-01-17 MED ORDER — HEPARIN (PORCINE) IN NACL 100-0.45 UNIT/ML-% IJ SOLN
1200.0000 [IU]/h | INTRAMUSCULAR | Status: DC
  Administered 2018-01-17: 1200 [IU]/h via INTRAVENOUS
  Filled 2018-01-17: qty 250

## 2018-01-17 NOTE — Consult Note (Signed)
[facility] Cardiology  CARDIOLOGY CONSULT NOTE  Patient ID: [name] MRN: [id number] DOB/AGE: 1935-02-26 83 y.o.  Admit date: 01/16/2018 Referring Physician Tressia Miners Primary Physician [facility] Primary Cardiologist Nehemiah Massed Reason for Consultation chest pain  HPI: 83 year old male referred for evaluation of chest pain and elevated troponin consistent with non-ST elevation myocardial infarction.  Her usual state of health until the morning of 01/16/2018 when he experienced sharp substernal chest discomfort which he felt was like indigestion.  He was brought to [facility] ED just in case".  He ECG revealed sinus rhythm, Q waves in leads V1 and V2 minimal ST elevation consistent with old or evolving anteroseptal MI.  Troponin was 0.04, and 0.29.  The patient was started on heparin drip, admitted to telemetry was had no recurrent chest pain.  Follow-up troponin is 0.58 and 0.46.  The patient has known coronary artery disease, with non-ST elevation myocardial infarction 10/14/2017.  Patient was scheduled for cardiac catheterization, but developed respiratory distress via catheterization was deferred.  2D echocardiogram 11/24/2017 revealed LVEF 40%.  Patient is status post left BKA with known lower extremity peripheral vascular disease.  Work-up including urogram of right lower extremity has been deferred to patient's multiple comorbidities.  Review of systems complete and found to be negative unless listed above     Past Medical History:  Diagnosis Date  . Arthritis   . Atrial fibrillation ([place])   . Carcinoma of prostate ([place])   . CHF (congestive heart failure) ([place])   . Chronic kidney disease   . Diabetes mellitus without complication ([place])   . ED (erectile dysfunction)   . Frequent urination   . Hyperlipidemia   . Hypertension   . Moderate mitral insufficiency   . Peripheral vascular disease ([place])   . Pneumonia 04/2016  . Prostate cancer ([place])   . Sleep apnea    OSA--USE C-PAP  . Ulcer of left  foot due to type 2 diabetes mellitus ([place])   . Urinary stress incontinence, male     Past Surgical History:  Procedure Laterality Date  . AMPUTATION Left 01/12/2017   Procedure: AMPUTATION BELOW KNEE;  Surgeon: Algernon Huxley, MD;  Location: [facility] ORS;  Service: General;  Laterality: Left;  . APLIGRAFT PLACEMENT Left 10/15/2016   Procedure: APLIGRAFT PLACEMENT;  Surgeon: Albertine Patricia, DPM;  Location: [facility] ORS;  Service: Podiatry;  Laterality: Left;  necrotic ulcer  . CHOLECYSTECTOMY    . EYE SURGERY Bilateral    Cataract Extraction with IOL  . IRRIGATION AND DEBRIDEMENT FOOT Left 10/15/2016   Procedure: IRRIGATION AND DEBRIDEMENT FOOT-EXCISIONAL DEBRIDEMENT OF SKIN 3RD, 4TH AND 5TH TOES WITH APPLICATION OF APLIGRAFT;  Surgeon: Albertine Patricia, DPM;  Location: [facility] ORS;  Service: Podiatry;  Laterality: Left;  necrotic,gangrene  . IRRIGATION AND DEBRIDEMENT FOOT Left 12/09/2016   Procedure: IRRIGATION AND DEBRIDEMENT FOOT-MUSCLE/FASCIA, RESECTION OF FOURTH AND FIFTH METATARSAL NECROTIC BONE;  Surgeon: Albertine Patricia, DPM;  Location: [facility] ORS;  Service: Podiatry;  Laterality: Left;  . LOWER EXTREMITY ANGIOGRAPHY Left 08/16/2016   Procedure: Lower Extremity Angiography;  Surgeon: Algernon Huxley, MD;  Location: [place] CV LAB;  Service: Cardiovascular;  Laterality: Left;  . LOWER EXTREMITY ANGIOGRAPHY Left 09/01/2016   Procedure: Lower Extremity Angiography;  Surgeon: Algernon Huxley, MD;  Location: [place] CV LAB;  Service: Cardiovascular;  Laterality: Left;  . LOWER EXTREMITY ANGIOGRAPHY Left 10/14/2016   Procedure: Lower Extremity Angiography;  Surgeon: Algernon Huxley, MD;  Location: [place] CV LAB;  Service: Cardiovascular;  Laterality: Left;  .  LOWER EXTREMITY ANGIOGRAPHY Left 12/20/2016   Procedure: Lower Extremity Angiography;  Surgeon: Algernon Huxley, MD;  Location: [place] CV LAB;  Service: Cardiovascular;  Laterality: Left;  . LOWER EXTREMITY INTERVENTION  09/01/2016    Procedure: Lower Extremity Intervention;  Surgeon: Algernon Huxley, MD;  Location: [place] CV LAB;  Service: Cardiovascular;;  . PROSTATE SURGERY     removal  . TONSILLECTOMY     as a child    Medications Prior to Admission  Medication Sig Dispense Refill Last Dose  . aspirin EC 81 MG tablet Take 81 mg by mouth daily.   01/16/2018 at 0800  . atorvastatin (LIPITOR) 80 MG tablet Take 80 mg by mouth daily at 6 PM.    01/15/2018 at 1800  . collagenase (SANTYL) ointment Apply topically daily. Right leg wound 15 g 0 As directed at As directed  . diltiazem (CARDIZEM CD) 120 MG 24 hr capsule Take 1 capsule (120 mg total) by mouth daily. 30 capsule 0 01/16/2018 at 0800  . empagliflozin (JARDIANCE) 25 MG TABS tablet Take 25 mg by mouth daily.   01/16/2018 at 0800  . furosemide (LASIX) 20 MG tablet Take 0.5 tablets (10 mg total) by mouth daily. (Patient taking differently: Take 20 mg by mouth daily. ) 30 tablet 0 01/16/2018 at 0800  . insulin glargine (LANTUS) 100 UNIT/ML injection Inject 0.1 mLs (10 Units total) into the skin at bedtime. (Patient taking differently: Inject 15 Units into the skin at bedtime. ) 10 mL 11 01/15/2018 at 2100  . insulin lispro (HUMALOG) 100 UNIT/ML injection Inject 0.03 mLs (3 Units total) into the skin 3 (three) times daily with meals. (Patient taking differently: Inject 3 Units into the skin 3 (three) times daily with meals. Plus 1 additional unit for every 50 BG points over 200 (max 5 additional units per dose)) 10 mL 0 As directed at As directed  . lisinopril (PRINIVIL,ZESTRIL) 2.5 MG tablet Take 1 tablet (2.5 mg total) by mouth daily. 30 tablet 0 01/16/2018 at 0800  . metoprolol tartrate (LOPRESSOR) 25 MG tablet Take 1 tablet (25 mg total) by mouth 3 (three) times daily. 90 tablet 0 01/16/2018 at 0800  . potassium chloride SA (K-DUR,KLOR-CON) 20 MEQ tablet Take 20 mEq by mouth daily.   01/16/2018 at 0800  . risperiDONE (RISPERDAL) 0.5 MG tablet Take 1 tablet (0.5 mg total) by mouth  at bedtime. 30 tablet 0 01/15/2018 at 2000  . rivaroxaban (XARELTO) 20 MG TABS tablet Take 1 tablet (20 mg total) by mouth daily with supper. 30 tablet 0 01/16/2018 at 0800  . digoxin (LANOXIN) 0.125 MG tablet Take 1 tablet (0.125 mg total) by mouth daily. (Patient not taking: Reported on 01/16/2018)   Not Taking at Unknown time  . oxyCODONE (OXYCONTIN) 10 mg 12 hr tablet Take 1 tablet (10 mg total) by mouth every 12 (twelve) hours. (Patient not taking: Reported on 01/16/2018) 10 tablet 0 Not Taking at Unknown time  . polyethylene glycol (MIRALAX / GLYCOLAX) packet Take 17 g by mouth daily. (Patient not taking: Reported on 01/16/2018) 30 each 0 Not Taking at Unknown time   Social History   Socioeconomic History  . Marital status: Married    Spouse name: Not on file  . Number of children: Not on file  . Years of education: Not on file  . Highest education level: Not on file  Occupational History  . Occupation: retired  Scientific laboratory technician  . Financial resource strain: Not on file  .  Food insecurity:    Worry: Not on file    Inability: Not on file  . Transportation needs:    Medical: Not on file    Non-medical: Not on file  Tobacco Use  . Smoking status: Former Smoker    Packs/day: 0.25    Types: Cigarettes    Last attempt to quit: 10/14/1994    Years since quitting: 23.2  . Smokeless tobacco: Never Used  Substance and Sexual Activity  . Alcohol use: No    Alcohol/week: 0.0 standard drinks  . Drug use: No  . Sexual activity: Not on file  Lifestyle  . Physical activity:    Days per week: Not on file    Minutes per session: Not on file  . Stress: Not on file  Relationships  . Social connections:    Talks on phone: Not on file    Gets together: Not on file    Attends religious service: Not on file    Active member of club or organization: Not on file    Attends meetings of clubs or organizations: Not on file    Relationship status: Not on file  . Intimate partner violence:    Fear of  current or ex partner: Not on file    Emotionally abused: Not on file    Physically abused: Not on file    Forced sexual activity: Not on file  Other Topics Concern  . Not on file  Social History Narrative  . Not on file    Family History  Problem Relation Age of Onset  . Hypertension Mother   . Diabetes Mother   . Prostate cancer Neg Hx   . Bladder Cancer Neg Hx   . Kidney disease Neg Hx       Review of systems complete and found to be negative unless listed above      PHYSICAL EXAM  General: Well developed, well nourished, in no acute distress HEENT:  Normocephalic and atramatic Neck:  No JVD.  Lungs: Clear bilaterally to auscultation and percussion. Heart: HRRR . Normal S1 and S2 without gallops or murmurs.  Abdomen: Bowel sounds are positive, abdomen soft and non-tender  Msk:  Back normal, normal gait. Normal strength and tone for age. Extremities: No clubbing, cyanosis or edema.   Neuro: Alert and oriented X 3. Psych:  Good affect, responds appropriately  Labs:   Lab Results  Component Value Date   WBC 7.5 01/17/2018   HGB 12.5 (L) 01/17/2018   HCT 36.9 (L) 01/17/2018   MCV 84.7 01/17/2018   PLT 211 01/17/2018    Recent Labs  Lab 01/17/18 0055  NA 138  K 3.5  CL 106  CO2 25  BUN 22  CREATININE 0.91  CALCIUM 8.4*  GLUCOSE 188*   Lab Results  Component Value Date   CKTOTAL 131 10/22/2016   CKMB 1.2 01/19/2013   TROPONINI 0.46 (HH) 01/17/2018    Lab Results  Component Value Date   CHOL 122 10/25/2017   CHOL 108 10/16/2017   CHOL 100 10/22/2016   Lab Results  Component Value Date   HDL 39 (L) 10/25/2017   HDL 37 (L) 10/16/2017   HDL 28 (L) 10/22/2016   Lab Results  Component Value Date   LDLCALC 70 10/25/2017   LDLCALC 62 10/16/2017   LDLCALC 56 10/22/2016   Lab Results  Component Value Date   TRIG 65 10/25/2017   TRIG 47 10/16/2017   TRIG 78 10/22/2016   Lab Results  Component Value Date   CHOLHDL 3.1 10/25/2017   CHOLHDL  2.9 10/16/2017   CHOLHDL 3.6 10/22/2016   No results found for: LDLDIRECT    Radiology: Dg Chest 2 View  Result Date: 01/16/2018 CLINICAL DATA:  Chest pain and nausea for several hours EXAM: CHEST - 2 VIEW COMPARISON:  11/23/2017 FINDINGS: Cardiac shadow is at the upper limits of normal in size. Aortic calcifications are noted. Mild interstitial changes are seen consistent with a degree of interstitial edema. No sizable effusion is seen. No focal infiltrate is noted. IMPRESSION: Mild interstitial edema.  No acute infiltrate is noted. Electronically Signed   By: Inez Catalina M.D.   On: 01/16/2018 11:05    EKG: Normal sinus rhythm, old or evolving anteroseptal MI  ASSESSMENT AND PLAN:   1.  Chest pain with atypical features, mildly elevated troponin, consistent with non-ST elevation myocardial infarction, patient with known coronary artery disease, with ischemic cardiomyopathy.  Based on patient's comorbidities, would pursue continued conservative management, and patient agrees. 2.  Ischemic cardiomyopathy 3.  Chronic systolic congestive heart failure, stable 4.  Chronic atrial fibrillation, on Xarelto for stroke prevention   Recommendations  1.  Continue current medications 2.  Continue heparin drip till 01/18/2018 3.  Resume Xarelto 01/18/2018 4.  Add isosorbide mononitrate 30 mg daily 5.  Defer cardiac catheterization at this time  Signed: Isaias Cowman MD,PhD, [facility] 01/17/2018, 7:57 AM

## 2018-01-17 NOTE — Progress Notes (Addendum)
[place] for a heparin drip Indication: chest pain/ACS  Allergies  Allergen Reactions  . Contrast Media [Iodinated Diagnostic Agents] Shortness Of Breath    SOB  . Iohexol Shortness Of Breath     Desc: Respiratory Distress, Laryngedema, diaphoresis, Onset Date: 72094709   . Metrizamide Shortness Of Breath    SOB SOB SOB   . Latex Rash    Patient Measurements: Height: 5\' 7"  (170.2 cm) Weight: 165 lb 8 oz (75.1 kg) IBW/kg (Calculated) : 66.1  Vital Signs: Temp: 98.4 F (36.9 C) (08/26 1948) Temp Source: Oral (08/26 1948) BP: 121/84 (08/26 1948) Pulse Rate: 67 (08/26 1948)  Labs: Recent Labs    01/16/18 1034 01/16/18 1334 01/16/18 1709 01/16/18 1850 01/17/18 0055  HGB 14.1  --   --   --  12.5*  HCT 41.8  --   --   --  36.9*  PLT 232  --   --   --  211  APTT  --   --  31  --  34  LABPROT  --   --  15.1  --   --   INR  --   --  1.20  --   --   HEPARINUNFRC  --   --  0.81*  --  0.26*  CREATININE 0.97  --   --   --  0.91  TROPONINI 0.04* 0.29*  --  0.58* 0.46*    Estimated Creatinine Clearance: 60.5 mL/min (by C-G formula based on SCr of 0.91 mg/dL).   Medical History: Past Medical History:  Diagnosis Date  . Arthritis   . Atrial fibrillation ([place])   . Carcinoma of prostate ([place])   . CHF (congestive heart failure) ([place])   . Chronic kidney disease   . Diabetes mellitus without complication ([place])   . ED (erectile dysfunction)   . Frequent urination   . Hyperlipidemia   . Hypertension   . Moderate mitral insufficiency   . Peripheral vascular disease ([place])   . Pneumonia 04/2016  . Prostate cancer ([place])   . Sleep apnea    OSA--USE C-PAP  . Ulcer of left foot due to type 2 diabetes mellitus ([place])   . Urinary stress incontinence, male     Assessment: 83 year-old male w/ PMH of atrial fibrillation, congestive heart failure, chronic kidney disease, diabetes type 2 essential hypertension, hyperlipidemia , peripheral  vascular disease, sleep apnea presenting to the hospital with complaint of chest pain. Patient's EKG was nondiagnostic.  He had a troponin initially was negative however subsequent troponin increased.  The ER physician discussed with cardiology who recommended starting patient on heparin. The patient is on Xarelto at home and, based on my conversation with him, his last dose was this morning between 6-7am. I discussed this with Dr Posey Pronto and he wishes to proceed with heparin therapy.  Goal of Therapy:  Anti-Xa (HL): 0.3-0.7 APTT: 66-102s Monitor platelets by anticoagulation protocol: Yes   Plan:  8/27 0100 HL 0.26 and aPTT 34. Levels are suptherapeutic. Will order heparin bolus of 1000units and increase infusion to 1150 units/hr. Recheck HL in 8 hours. CBC with AM labs per protocol.   Pernell Dupre, PharmD, BCPS Clinical Pharmacist 01/17/2018 1:58 AM

## 2018-01-17 NOTE — Care Management Note (Signed)
Case Management Note  Patient Details  Name: [name] MRN: [id number] Date of Birth: May 09, 1935  Subjective/Objective:                 Patient recently discharged home from [facility] without any home health services.  He has been followed by [facility] in the past but agency not able to provided home health nursing at present.  Admitted for nstemi. History of CHF which is stable at present.Heparin drip. To manage symptoms medically a is high risk for cardiac cath. Chronic Xarelto   Action/Plan:   Expected Discharge Date:                  Expected Discharge Plan:     In-House Referral:     Discharge planning Services     Post Acute Care Choice:    Choice offered to:     DME Arranged:    DME Agency:     HH Arranged:    HH Agency:     Status of Service:     If discussed at [employer] of Stay Meetings, dates discussed:    Additional Comments:  Katrina Stack, RN 01/17/2018, 4:45 PM

## 2018-01-17 NOTE — Progress Notes (Signed)
Inpatient Diabetes Program Recommendations  AACE/ADA: New Consensus Statement on Inpatient Glycemic Control (2019)  Target Ranges:  Prepandial:   less than 140 mg/dL      Peak postprandial:   less than 180 mg/dL (1-2 hours)      Critically ill patients:  140 - 180 mg/dL  Results for [name], [name] (MRN [id number]) as of 01/17/2018 11:34  Ref. Range 01/16/2018 10:34 01/16/2018 16:58 01/17/2018 00:55  Glucose Latest Ref Range: 70 - 99 mg/dL 232 (H)  188 (H)  Hemoglobin A1C Latest Ref Range: 4.8 - 5.6 %  6.7 (H)    Results for [name], [name] (MRN [id number]) as of 01/17/2018 11:34  Ref. Range 01/16/2018 21:24  Glucose-Capillary Latest Ref Range: 70 - 99 mg/dL 231 (H)   Review of Glycemic Control  Diabetes history: DM2 Outpatient Diabetes medications: Lantus 15 units QHS, Humalog 3 units TID with meals plus 1 unit for every 50 mg/dl above 200 mg/dl, Jardiance 25 mg daily Current orders for Inpatient glycemic control: Lantus 10 units QHS  Inpatient Diabetes Program Recommendations: Correction (SSI): While inpatient, please consider ordering CBGs with Novolog 0-9 units TID with meals and Novolog 0-5 units QHS. Insulin - Meal Coverage: If post prandial glucose is consistently greater than 180 mg/dl, please consider ordering Novolog 3 units TID with meals for meal coverage if patient eats at least 50% of meals.  Thanks, Barnie Alderman, RN, MSN, CDE Diabetes Coordinator Inpatient Diabetes Program [phone] (Team Pager from 8am to 5pm)

## 2018-01-17 NOTE — Progress Notes (Signed)
[name] for a heparin drip Indication: chest pain/ACS  Allergies  Allergen Reactions  . Contrast Media [Iodinated Diagnostic Agents] Shortness Of Breath    SOB  . Iohexol Shortness Of Breath     Desc: Respiratory Distress, Laryngedema, diaphoresis, Onset Date: [id number]   . Metrizamide Shortness Of Breath    SOB SOB SOB   . Latex Rash    Patient Measurements: Height: 5\' 7"  (170.2 cm) Weight: 165 lb 8 oz (75.1 kg) IBW/kg (Calculated) : 66.1  Vital Signs: Temp: 97.9 F (36.6 C) (08/27 0733) Temp Source: Oral (08/27 0733) BP: 139/99 (08/27 0959) Pulse Rate: 112 (08/27 0959)  Labs: Recent Labs    01/16/18 1034 01/16/18 1334 01/16/18 1709 01/16/18 1850 01/17/18 0055 01/17/18 1000  HGB 14.1  --   --   --  12.5*  --   HCT 41.8  --   --   --  36.9*  --   PLT 232  --   --   --  211  --   APTT  --   --  31  --  34 60*  LABPROT  --   --  15.1  --   --   --   INR  --   --  1.20  --   --   --   HEPARINUNFRC  --   --  0.81*  --  0.26* 0.62  CREATININE 0.97  --   --   --  0.91  --   TROPONINI 0.04* 0.29*  --  0.58* 0.46*  --     Estimated Creatinine Clearance: 60.5 mL/min (by C-G formula based on SCr of 0.91 mg/dL).   Medical History: Past Medical History:  Diagnosis Date  . Arthritis   . Atrial fibrillation ([place])   . Carcinoma of prostate ([place])   . CHF (congestive heart failure) ([place])   . Chronic kidney disease   . Diabetes mellitus without complication ([place])   . ED (erectile dysfunction)   . Frequent urination   . Hyperlipidemia   . Hypertension   . Moderate mitral insufficiency   . Peripheral vascular disease ([place])   . Pneumonia 04/2016  . Prostate cancer ([place])   . Sleep apnea    OSA--USE C-PAP  . Ulcer of left foot due to type 2 diabetes mellitus ([place])   . Urinary stress incontinence, male     Assessment: 83 year-old male w/ PMH of atrial fibrillation, CHF, CKD, diabetes type 2 essential hypertension,  hyperlipidemia , peripheral vascular disease, sleep apnea presenting to the hospital with complaint of chest pain. Patient's EKG was nondiagnostic.  He had a troponin initially was negative however subsequent troponin increased.  The ER physician discussed with cardiology who recommended starting patient on heparin. The patient is on Xarelto at home; his last dose was 0826 between 6-7am. This was discussed with Dr Posey Pronto and he wishes to proceed with heparin therapy.  0827 0100 Heparin bolus of 1000units and increase infusion to 1150 units/hr.  Goal of Therapy:  Anti-Xa (HL): 0.3-0.7 APTT: 66-102s Monitor platelets by anticoagulation protocol: Yes   Plan:  8/27 100 HL 0.62 and aPTT 60. Levels are subtherapeutic. Will increase infusion to 1200 units/hr. Recheck HL in 8 hours. CBC with AM labs per protocol.    Paticia Stack, PharmD Pharmacy Resident  01/17/2018 10:36 AM

## 2018-01-17 NOTE — Consult Note (Signed)
[facility] Nurse wound consult note Reason for Consult: Chronic, nonhealing ulceration, full thickness, on RLE pretibial area. He has been seen by our department in the past and specifically by this writer in May of this year. Wound type:Arterial insufficiency Pressure Injury POA: NA Measurement:2.8cm x 3.4cm x 0.2cm Wound bed:60% yellow and 40% pale pink, dry Drainage (amount, consistency, odor) none Periwound: intact with mild erythema in the immediate periwound tissue. No warmth, no induration Dressing procedure/placement/frequency: I will implement a conservative POC using collagenase (Santyl) enzymatic debriding ointment once daily for three weeks. Patient would benefit from a [facility] or referral to an outpatient [facility] of his choosing. If you agree, please Order/Arrange with Care Management. He has been seen by Vascular surgery and Podiatry in the past for the left LE (which has been amputated).   [place] nursing team will not follow, but will remain available to this patient, the nursing and medical teams.  Please re-consult if needed. Thanks, Maudie Flakes, MSN, RN, [place], Arther Abbott  Pager# [phone]

## 2018-01-17 NOTE — Progress Notes (Signed)
[place] for a heparin drip Indication: chest pain/ACS  Allergies  Allergen Reactions  . Contrast Media [Iodinated Diagnostic Agents] Shortness Of Breath    SOB  . Iohexol Shortness Of Breath     Desc: Respiratory Distress, Laryngedema, diaphoresis, Onset Date: [id number]   . Metrizamide Shortness Of Breath    SOB SOB SOB   . Latex Rash    Patient Measurements: Height: 5\' 7"  (170.2 cm) Weight: 165 lb 8 oz (75.1 kg) IBW/kg (Calculated) : 66.1  Vital Signs: Temp: 98.5 F (36.9 C) (08/27 1954) Temp Source: Oral (08/27 1954) BP: 120/74 (08/27 1954) Pulse Rate: 78 (08/27 1954)  Labs: Recent Labs    01/16/18 1034 01/16/18 1334  01/16/18 1709 01/16/18 1850 01/17/18 0055 01/17/18 1000 01/17/18 1921  HGB 14.1  --   --   --   --  12.5*  --   --   HCT 41.8  --   --   --   --  36.9*  --   --   PLT 232  --   --   --   --  211  --   --   APTT  --   --    < > 31  --  34 60* 58*  LABPROT  --   --   --  15.1  --   --   --   --   INR  --   --   --  1.20  --   --   --   --   HEPARINUNFRC  --   --    < > 0.81*  --  0.26* 0.62 0.44  CREATININE 0.97  --   --   --   --  0.91  --   --   TROPONINI 0.04* 0.29*  --   --  0.58* 0.46*  --   --    < > = values in this interval not displayed.    Estimated Creatinine Clearance: 60.5 mL/min (by C-G formula based on SCr of 0.91 mg/dL).   Medical History: Past Medical History:  Diagnosis Date  . Arthritis   . Atrial fibrillation ([place])   . Carcinoma of prostate ([place])   . CHF (congestive heart failure) ([place])   . Chronic kidney disease   . Diabetes mellitus without complication ([place])   . ED (erectile dysfunction)   . Frequent urination   . Hyperlipidemia   . Hypertension   . Moderate mitral insufficiency   . Peripheral vascular disease ([place])   . Pneumonia 04/2016  . Prostate cancer ([place])   . Sleep apnea    OSA--USE C-PAP  . Ulcer of left foot due to type 2 diabetes mellitus ([place])   . Urinary  stress incontinence, male     Assessment: 83 year-old male w/ PMH of atrial fibrillation, CHF, CKD, diabetes type 2 essential hypertension, hyperlipidemia , peripheral vascular disease, sleep apnea presenting to the hospital with complaint of chest pain. Patient's EKG was nondiagnostic.  He had a troponin initially was negative however subsequent troponin increased.  The ER physician discussed with cardiology who recommended starting patient on heparin. The patient is on Xarelto at home; his last dose was 0826 between 6-7am. This was discussed with Dr Posey Pronto and he wishes to proceed with heparin therapy.  Heparin infusing at 1200 units/hr. 8/27 19:30 Heparin level is 0.44 and aPTT is 58 seconds (not quite correlating yet). APTT remains slightly subtherapeutic.  Goal of Therapy:  Anti-Xa (HL):  0.3-0.7 APTT: 66-102s Monitor platelets by anticoagulation protocol: Yes   Plan:  Will order Heparin 1000 units IV bolus and increase infusion to 1300 units/hr. Recheck HL in 8 hours. CBC with AM labs per protocol.    Paulina Fusi, PharmD, BCPS 01/17/2018 8:11 PM

## 2018-01-17 NOTE — Plan of Care (Signed)
  Problem: Nutrition: Goal: Adequate nutrition will be maintained Outcome: Progressing   Problem: Coping: Goal: Level of anxiety will decrease Outcome: Progressing   Problem: Elimination: Goal: Will not experience complications related to urinary retention Outcome: Progressing   Problem: Pain Managment: Goal: General experience of comfort will improve Outcome: Progressing Note:  No complaints of pain this shift   Problem: Safety: Goal: Ability to remain free from injury will improve Outcome: Progressing   Problem: Skin Integrity: Goal: Risk for impaired skin integrity will decrease Outcome: Not Progressing Note:  Right leg diabetic ulcer, wound care consult placed. Ulcer cleansed and wrapped for now.   Problem: Education: Goal: Knowledge of General Education information will improve Description Including pain rating scale, medication(s)/side effects and non-pharmacologic comfort measures Outcome: Completed/Met

## 2018-01-17 NOTE — Progress Notes (Signed)
[facility] at [place] NAME: Salvator Seppala    MR#:  [id number]  DATE OF BIRTH:  Aug 05, 1934  SUBJECTIVE:  CHIEF COMPLAINT:   Chief Complaint  Patient presents with  . Chest Pain   -Admitted with sharp chest pain, troponins are elevated but steady. -Remains on heparin drip.  Conservative management  REVIEW OF SYSTEMS:  Review of Systems  Constitutional: Negative for chills, fever and malaise/fatigue.  HENT: Negative for congestion, ear discharge, hearing loss and nosebleeds.   Eyes: Negative for blurred vision and double vision.  Respiratory: Positive for shortness of breath. Negative for cough and wheezing.   Cardiovascular: Negative for chest pain and palpitations.  Gastrointestinal: Negative for abdominal pain, constipation, diarrhea, nausea and vomiting.  Genitourinary: Negative for dysuria.  Musculoskeletal: Negative for myalgias.  Neurological: Negative for dizziness, focal weakness, seizures, weakness and headaches.  Psychiatric/Behavioral: Negative for depression.    DRUG ALLERGIES:   Allergies  Allergen Reactions  . Contrast Media [Iodinated Diagnostic Agents] Shortness Of Breath    SOB  . Iohexol Shortness Of Breath     Desc: Respiratory Distress, Laryngedema, diaphoresis, Onset Date: [id number]   . Metrizamide Shortness Of Breath    SOB SOB SOB   . Latex Rash    VITALS:  Blood pressure (!) 139/99, pulse (!) 112, temperature 97.9 F (36.6 C), temperature source Oral, resp. rate 16, height 5\' 7"  (1.702 m), weight 75.1 kg, SpO2 96 %.  PHYSICAL EXAMINATION:  Physical Exam  GENERAL:  83 y.o.-year-old patient lying in the bed with no acute distress.  EYES: Pupils equal, round, reactive to light and accommodation. No scleral icterus. Extraocular muscles intact.  HEENT: Head atraumatic, normocephalic. Oropharynx and nasopharynx clear.  NECK:  Supple, no jugular venous distention. No thyroid enlargement, no  tenderness.  LUNGS: Normal breath sounds bilaterally, no wheezing, rales,rhonchi or crepitation. No use of accessory muscles of respiration.  Decreased bibasilar breath sounds CARDIOVASCULAR: S1, S2 normal. No rubs, or gallops.  3/6 systolic murmur is present ABDOMEN: Soft, nontender, nondistended. Bowel sounds present. No organomegaly or mass.  EXTREMITIES: No pedal edema, cyanosis, or clubbing. Left BKA. Open shallow wound on the right shin. NEUROLOGIC: Cranial nerves II through XII are intact. Muscle strength 5/5 in all extremities. Sensation intact. Gait not checked.  PSYCHIATRIC: The patient is alert and oriented x 3.  SKIN: No obvious rash, lesion, or ulcer.    LABORATORY PANEL:   CBC Recent Labs  Lab 01/17/18 0055  WBC 7.5  HGB 12.5*  HCT 36.9*  PLT 211   ------------------------------------------------------------------------------------------------------------------  Chemistries  Recent Labs  Lab 01/17/18 0055  NA 138  K 3.5  CL 106  CO2 25  GLUCOSE 188*  BUN 22  CREATININE 0.91  CALCIUM 8.4*   ------------------------------------------------------------------------------------------------------------------  Cardiac Enzymes Recent Labs  Lab 01/17/18 0055  TROPONINI 0.46*   ------------------------------------------------------------------------------------------------------------------  RADIOLOGY:  Dg Chest 2 View  Result Date: 01/16/2018 CLINICAL DATA:  Chest pain and nausea for several hours EXAM: CHEST - 2 VIEW COMPARISON:  11/23/2017 FINDINGS: Cardiac shadow is at the upper limits of normal in size. Aortic calcifications are noted. Mild interstitial changes are seen consistent with a degree of interstitial edema. No sizable effusion is seen. No focal infiltrate is noted. IMPRESSION: Mild interstitial edema.  No acute infiltrate is noted. Electronically Signed   By: Inez Catalina M.D.   On: 01/16/2018 11:05    EKG:   Orders placed or performed during  the hospital encounter  of 01/16/18  . EKG 12-Lead  . EKG 12-Lead  . ED EKG within 10 minutes  . ED EKG within 10 minutes    ASSESSMENT AND PLAN:   83 year old male with past medical history significant for atrial fibrillation on Xarelto, CKD, diabetes, hypertension, peripheral vascular disease status post left BKA, congestive heart failure presents to hospital secondary to chest pain  1. NSTEMI- improved chest pain -Troponins elevated, but plateaued now.  Due to high risk for cardiac catheterization, ulcerative management is planned. -Started on heparin drip, continue for another day.  Started on Imdur. -Appreciate cardiology consult. -Continue aspirin, statin, lisinopril and metoprolol  2. Afib-continue digoxin.  On diltiazem and metoprolol for rate control -Currently on heparin drip.  Xarelto can be restarted prior to discharge tomorrow  3.  Diabetes mellitus-on Lantus, sliding scale insulin.  Also aspart insulin prior to meals  4.  Sleep apnea-refuses CPAP.  5.  DVT prophylaxis-on heparin drip  6.  Wound on right leg-wound care nurse consult.  Patient follows with wound care surgeon as outpatient     All the records are reviewed and case discussed with Care Management/Social Workerr. Management plans discussed with the patient, family and they are in agreement.  CODE STATUS: Full code  TOTAL TIME TAKING CARE OF THIS PATIENT: 38 minutes.   POSSIBLE D/C IN to 1-2 DAYS, DEPENDING ON CLINICAL CONDITION.   [name] M.D on 01/17/2018 at 1:03 PM  Between 7am to 6pm - Pager - [phone]  After 6pm go to [email or web address] - password [email or web address]  Office  [phone]  CC: Primary care physician; System, Pcp Not In

## 2018-01-18 LAB — BASIC METABOLIC PANEL
Anion gap: 8 (ref 5–15)
BUN: 28 mg/dL — AB (ref 8–23)
CALCIUM: 8.3 mg/dL — AB (ref 8.9–10.3)
CO2: 28 mmol/L (ref 22–32)
Chloride: 104 mmol/L (ref 98–111)
Creatinine, Ser: 1.13 mg/dL (ref 0.61–1.24)
GFR calc Af Amer: >60 mL/min (ref >60)
GFR calc non Af Amer: 59 mL/min — ABNORMAL LOW (ref >60)
Glucose, Bld: 134 mg/dL — ABNORMAL HIGH (ref 70–99)
Potassium: 4.2 mmol/L (ref 3.5–5.1)
SODIUM: 140 mmol/L (ref 135–145)

## 2018-01-18 LAB — CBC
HCT: 34.9 % — ABNORMAL LOW (ref 40.0–52.0)
Hemoglobin: 11.9 g/dL — ABNORMAL LOW (ref 13.0–18.0)
MCH: 29 pg (ref 26.0–34.0)
MCHC: 34 g/dL (ref 32.0–36.0)
MCV: 85.1 fL (ref 80.0–100.0)
Platelets: 194 10*3/uL (ref 150–440)
RBC: 4.1 MIL/uL — AB (ref 4.40–5.90)
RDW: 14.6 % — ABNORMAL HIGH (ref 11.5–14.5)
WBC: 7.5 10*3/uL (ref 3.8–10.6)

## 2018-01-18 LAB — HEPARIN LEVEL (UNFRACTIONATED): Heparin Unfractionated: 0.8 IU/mL — ABNORMAL HIGH (ref 0.30–0.70)

## 2018-01-18 LAB — GLUCOSE, CAPILLARY
GLUCOSE-CAPILLARY: 99 mg/dL (ref 70–99)
Glucose-Capillary: 251 mg/dL — ABNORMAL HIGH (ref 70–99)

## 2018-01-18 LAB — APTT: aPTT: 117 seconds — ABNORMAL HIGH (ref 24–36)

## 2018-01-18 MED ORDER — ISOSORBIDE MONONITRATE ER 30 MG PO TB24: 30.0000 mg | ORAL_TABLET | Freq: Every day | ORAL | 2 refills | Status: DC

## 2018-01-18 MED ORDER — RIVAROXABAN 15 MG PO TABS
15.0000 mg | ORAL_TABLET | Freq: Every day | ORAL | Status: DC
  Administered 2018-01-18: 15 mg via ORAL
  Filled 2018-01-18: qty 1

## 2018-01-18 MED ORDER — FUROSEMIDE 20 MG PO TABS: 20.0000 mg | ORAL_TABLET | Freq: Every day | ORAL | 2 refills | Status: DC

## 2018-01-18 MED ORDER — DIGOXIN 125 MCG PO TABS: 0.1250 mg | ORAL_TABLET | Freq: Every day | ORAL | 2 refills | Status: DC

## 2018-01-18 NOTE — Progress Notes (Signed)
Pt discharged home via wheelchair, son at side. IV discontinue without incident, telemetry box returned to NS. Dressing to right lower extremity changed prior to discharge. Minimal drainage noted on old dressing. Wound measured approx 2.5cm x 2.5cm. Dressing changed per order. Pt tolerated procedure.

## 2018-01-18 NOTE — Discharge Summary (Signed)
[facility] at [place] NAME: [name]    MR#:  [id number]  DATE OF BIRTH:  [birth year]  DATE OF ADMISSION:  01/16/2018   ADMITTING PHYSICIAN: Dustin Flock, MD  DATE OF DISCHARGE: 01/18/2018  1:35 PM  PRIMARY CARE PHYSICIAN: System, Pcp Not In   ADMISSION DIAGNOSIS:   NSTEMI (non-ST elevated myocardial infarction) ([place]) [I21.4]  DISCHARGE DIAGNOSIS:   Active Problems:   NSTEMI (non-ST elevated myocardial infarction) ([place])   SECONDARY DIAGNOSIS:   Past Medical History:  Diagnosis Date  . Arthritis   . Atrial fibrillation ([place])   . Carcinoma of prostate ([place])   . CHF (congestive heart failure) ([place])   . Chronic kidney disease   . Diabetes mellitus without complication ([place])   . ED (erectile dysfunction)   . Frequent urination   . Hyperlipidemia   . Hypertension   . Moderate mitral insufficiency   . Peripheral vascular disease ([place])   . Pneumonia 04/2016  . Prostate cancer ([place])   . Sleep apnea    OSA--USE C-PAP  . Ulcer of left foot due to type 2 diabetes mellitus ([place])   . Urinary stress incontinence, male     HOSPITAL COURSE:   83 year old male with past medical history significant for atrial fibrillation on Xarelto, CKD, diabetes, hypertension, peripheral vascular disease status post left BKA, congestive heart failure presents to hospital secondary to chest pain  1. NSTEMI- improved chest pain -Troponins elevated, but plateaued now.  Due to high risk for cardiac catheterization, conservative management is planned.  Prior cardiac catheterization resulted in complicated hospital course. -Received heparin drip for 2 days.Buddy Duty on Imdur. -Appreciate cardiology consult. -Continue aspirin, statin, lisinopril and metoprolol  2. Afib-continue digoxin.  On diltiazem and metoprolol for rate control -Heparin drip while in the hospital.  Discharged on Xarelto.  Patient on 20 mg Xarelto at home.  Will discharge on  the same dose.  Renal function is stable  3.  Diabetes mellitus-on Lantus,and aspart insulin prior to meals  4.  Sleep apnea-refuses CPAP.  5.  Wound on right leg- appreciate wound care nurse consult. Dressing changed.  Patient follows with wound care surgeon as outpatient  Stable and asymptomatic today.  Will be discharged home today.    DISCHARGE CONDITIONS:   Guarded  CONSULTS OBTAINED:   None  DRUG ALLERGIES:   Allergies  Allergen Reactions  . Contrast Media [Iodinated Diagnostic Agents] Shortness Of Breath    SOB  . Iohexol Shortness Of Breath     Desc: Respiratory Distress, Laryngedema, diaphoresis, Onset Date: [id number]   . Metrizamide Shortness Of Breath    SOB SOB SOB   . Latex Rash   DISCHARGE MEDICATIONS:   Allergies as of 01/18/2018      Reactions   Contrast Media [iodinated Diagnostic Agents] Shortness Of Breath   SOB   Iohexol Shortness Of Breath    Desc: Respiratory Distress, Laryngedema, diaphoresis, Onset Date: [id number]   Metrizamide Shortness Of Breath   SOB SOB SOB   Latex Rash      Medication List    STOP taking these medications   oxyCODONE 10 mg 12 hr tablet Commonly known as:  OXYCONTIN     TAKE these medications   aspirin EC 81 MG tablet Take 81 mg by mouth daily.   atorvastatin 80 MG tablet Commonly known as:  LIPITOR Take 80 mg by mouth daily at 6 PM.   collagenase ointment Commonly known as:  SANTYL Apply topically daily. Right leg wound   digoxin 0.125 MG tablet Commonly known as:  LANOXIN Take 1 tablet (0.125 mg total) by mouth daily.   diltiazem 120 MG 24 hr capsule Commonly known as:  CARDIZEM CD Take 1 capsule (120 mg total) by mouth daily.   furosemide 20 MG tablet Commonly known as:  LASIX Take 1 tablet (20 mg total) by mouth daily.   insulin glargine 100 UNIT/ML injection Commonly known as:  LANTUS Inject 0.1 mLs (10 Units total) into the skin at bedtime. What changed:  how much to take     insulin lispro 100 UNIT/ML injection Commonly known as:  HUMALOG Inject 0.03 mLs (3 Units total) into the skin 3 (three) times daily with meals. What changed:  additional instructions   isosorbide mononitrate 30 MG 24 hr tablet Commonly known as:  IMDUR Take 1 tablet (30 mg total) by mouth daily. Start taking on:  01/19/2018   JARDIANCE 25 MG Tabs tablet Generic drug:  empagliflozin Take 25 mg by mouth daily.   lisinopril 2.5 MG tablet Commonly known as:  PRINIVIL,ZESTRIL Take 1 tablet (2.5 mg total) by mouth daily.   metoprolol tartrate 25 MG tablet Commonly known as:  LOPRESSOR Take 1 tablet (25 mg total) by mouth 3 (three) times daily.   polyethylene glycol packet Commonly known as:  MIRALAX / GLYCOLAX Take 17 g by mouth daily.   potassium chloride SA 20 MEQ tablet Commonly known as:  K-DUR,KLOR-CON Take 20 mEq by mouth daily.   risperiDONE 0.5 MG tablet Commonly known as:  RISPERDAL Take 1 tablet (0.5 mg total) by mouth at bedtime.   rivaroxaban 20 MG Tabs tablet Commonly known as:  XARELTO Take 1 tablet (20 mg total) by mouth daily with supper.        DISCHARGE INSTRUCTIONS:   1.  PCP follow-up in 1 to 2 weeks 2.  Cardiology follow-up in 2 weeks  DIET:   Cardiac diet  ACTIVITY:   Activity as tolerated  OXYGEN:   Home Oxygen: No.  Oxygen Delivery: room air  DISCHARGE LOCATION:   home   If you experience worsening of your admission symptoms, develop shortness of breath, life threatening emergency, suicidal or homicidal thoughts you must seek medical attention immediately by calling 911 or calling your MD immediately  if symptoms less severe.  You Must read complete instructions/literature along with all the possible adverse reactions/side effects for all the Medicines you take and that have been prescribed to you. Take any new Medicines after you have completely understood and accpet all the possible adverse reactions/side effects.   Please  note  You were cared for by a hospitalist during your hospital stay. If you have any questions about your discharge medications or the care you received while you were in the hospital after you are discharged, you can call the unit and asked to speak with the hospitalist on call if the hospitalist that took care of you is not available. Once you are discharged, your primary care physician will handle any further medical issues. Please note that NO REFILLS for any discharge medications will be authorized once you are discharged, as it is imperative that you return to your primary care physician (or establish a relationship with a primary care physician if you do not have one) for your aftercare needs so that they can reassess your need for medications and monitor your lab values.    On the day of Discharge:  VITAL SIGNS:  Blood pressure (!) 141/80, pulse (!) 110, temperature 98.4 F (36.9 C), temperature source Oral, resp. rate 18, height 5\' 7"  (1.702 m), weight 75.1 kg, SpO2 100 %.  PHYSICAL EXAMINATION:    GENERAL:  83 y.o.-year-old patient lying in the bed with no acute distress.  EYES: Pupils equal, round, reactive to light and accommodation. No scleral icterus. Extraocular muscles intact.  HEENT: Head atraumatic, normocephalic. Oropharynx and nasopharynx clear.  NECK:  Supple, no jugular venous distention. No thyroid enlargement, no tenderness.  LUNGS: Normal breath sounds bilaterally, no wheezing, rales,rhonchi or crepitation. No use of accessory muscles of respiration.  Decreased bibasilar breath sounds CARDIOVASCULAR: S1, S2 normal. No rubs, or gallops.  3/6 systolic murmur is present ABDOMEN: Soft, nontender, nondistended. Bowel sounds present. No organomegaly or mass.  EXTREMITIES: No pedal edema, cyanosis, or clubbing. Left BKA. Open shallow wound on the right shin. NEUROLOGIC: Cranial nerves II through XII are intact. Muscle strength 5/5 in all extremities. Sensation intact. Gait  not checked.  PSYCHIATRIC: The patient is alert and oriented x 3.  SKIN: No obvious rash, lesion, or ulcer.   DATA REVIEW:   CBC Recent Labs  Lab 01/18/18 0417  WBC 7.5  HGB 11.9*  HCT 34.9*  PLT 194    Chemistries  Recent Labs  Lab 01/18/18 0417  NA 140  K 4.2  CL 104  CO2 28  GLUCOSE 134*  BUN 28*  CREATININE 1.13  CALCIUM 8.3*     Microbiology Results  Results for orders placed or performed during the hospital encounter of 01/16/18  MRSA PCR Screening     Status: None   Collection Time: 01/16/18  7:49 PM  Result Value Ref Range Status   MRSA by PCR NEGATIVE NEGATIVE Final    Comment:        The GeneXpert MRSA Assay (FDA approved for NASAL specimens only), is one component of a comprehensive MRSA colonization surveillance program. It is not intended to diagnose MRSA infection nor to guide or monitor treatment for MRSA infections. Performed at [facility], [address], [place], [place] [address]     RADIOLOGY:  No results found.   Management plans discussed with the patient, family and they are in agreement.  CODE STATUS:     Code Status Orders  (From admission, onward)         Start     Ordered   01/16/18 1515  Full code  Continuous     01/16/18 1515        Code Status History    Date Active Date Inactive Code Status Order ID Comments User Context   11/23/2017 1346 11/28/2017 2020 Full Code [id number]  Nicholes Mango, MD Inpatient   10/26/2017 1534 10/28/2017 1854 Full Code [id number]  Bettey Costa, MD Inpatient   10/26/2017 1437 10/26/2017 1534 Full Code [id number]  Feliberto Gottron, RN Inpatient   10/24/2017 1152 10/26/2017 1437 DNR [id number]  Bettey Costa, MD Inpatient   10/15/2017 0309 10/24/2017 1152 Full Code [id number]  Arta Silence, MD Inpatient   02/10/2017 1904 02/12/2017 1908 Full Code [id number]  Schnier, Dolores Lory, MD ED   02/10/2017 1638 02/10/2017 1904 Full Code [id number]  Sela Hua, PA-C Outpatient   01/08/2017  1342 01/19/2017 2103 Full Code [id number]  Loletha Grayer, MD ED   12/29/2016 2232 12/30/2016 1634 Full Code [id number]  Lance Coon, MD ED   12/20/2016 1251 12/20/2016 1842 Full Code [id number]  Algernon Huxley, MD Inpatient   10/22/2016 0040 10/24/2016 0349  Full Code [id number]  Lance Coon, MD Inpatient   10/14/2016 1201 10/14/2016 1925 Full Code [id number]  Algernon Huxley, MD Inpatient   08/30/2016 1338 09/06/2016 1941 Full Code [id number]  Epifanio Lesches, MD Inpatient   05/19/2016 0454 05/22/2016 1907 Full Code [id number]  Saundra Shelling, MD Inpatient    Advance Directive Documentation     Most Recent Value  Type of Advance Directive  Healthcare Power of Attorney  Pre-existing out of facility DNR order (yellow form or pink MOST form)  -  "MOST" Form in Place?  -      TOTAL TIME TAKING CARE OF THIS PATIENT: 38 minutes.    [name] M.D on 01/18/2018 at 2:33 PM  Between 7am to 6pm - Pager - [phone]  After 6pm go to [email or web address] - Proofreader  [facility]  Office  [phone]  CC: Primary care physician; System, Pcp Not In   Note: This dictation was prepared with Dragon dictation along with smaller phrase technology. Any transcriptional errors that result from this process are unintentional.

## 2018-01-18 NOTE — Progress Notes (Signed)
[facility] for a heparin drip Indication: chest pain/ACS  Allergies  Allergen Reactions  . Contrast Media [Iodinated Diagnostic Agents] Shortness Of Breath    SOB  . Iohexol Shortness Of Breath     Desc: Respiratory Distress, Laryngedema, diaphoresis, Onset Date: [id number]   . Metrizamide Shortness Of Breath    SOB SOB SOB   . Latex Rash    Patient Measurements: Height: 5\' 7"  (170.2 cm) Weight: 165 lb 8 oz (75.1 kg) IBW/kg (Calculated) : 66.1  Vital Signs: Temp: 98.5 F (36.9 C) (08/27 1954) Temp Source: Oral (08/27 1954) BP: 120/74 (08/27 1954) Pulse Rate: 78 (08/27 1954)  Labs: Recent Labs    01/16/18 1034 01/16/18 1334  01/16/18 1709 01/16/18 1850 01/17/18 0055 01/17/18 1000 01/17/18 1921 01/18/18 0417  HGB 14.1  --   --   --   --  12.5*  --   --  11.9*  HCT 41.8  --   --   --   --  36.9*  --   --  34.9*  PLT 232  --   --   --   --  211  --   --  194  APTT  --   --    < > 31  --  34 60* 58* 117*  LABPROT  --   --   --  15.1  --   --   --   --   --   INR  --   --   --  1.20  --   --   --   --   --   HEPARINUNFRC  --   --    < > 0.81*  --  0.26* 0.62 0.44 0.80*  CREATININE 0.97  --   --   --   --  0.91  --   --  1.13  TROPONINI 0.04* 0.29*  --   --  0.58* 0.46*  --   --   --    < > = values in this interval not displayed.    Estimated Creatinine Clearance: 48.7 mL/min (by C-G formula based on SCr of 1.13 mg/dL).   Medical History: Past Medical History:  Diagnosis Date  . Arthritis   . Atrial fibrillation ([place])   . Carcinoma of prostate ([place])   . CHF (congestive heart failure) ([place])   . Chronic kidney disease   . Diabetes mellitus without complication ([place])   . ED (erectile dysfunction)   . Frequent urination   . Hyperlipidemia   . Hypertension   . Moderate mitral insufficiency   . Peripheral vascular disease ([place])   . Pneumonia 04/2016  . Prostate cancer ([place])   . Sleep apnea    OSA--USE C-PAP  . Ulcer of  left foot due to type 2 diabetes mellitus ([place])   . Urinary stress incontinence, male     Assessment: 83 year-old male w/ PMH of atrial fibrillation, CHF, CKD, diabetes type 2 essential hypertension, hyperlipidemia , peripheral vascular disease, sleep apnea presenting to the hospital with complaint of chest pain. Patient's EKG was nondiagnostic.  He had a troponin initially was negative however subsequent troponin increased.  The ER physician discussed with cardiology who recommended starting patient on heparin. The patient is on Xarelto at home; his last dose was 0826 between 6-7am. This was discussed with Dr Posey Pronto and he wishes to proceed with heparin therapy.   Goal of Therapy:  Anti-Xa (HL): 0.3-0.7 APTT: 66-102s Monitor platelets by anticoagulation  protocol: Yes   Plan:  08/28 @ 0417 HL: 0.80 and aPTT 117. Both HL and aPTT are supratherapeutic. Will decrease heparin infusion to 1200 units/hr and recheck HL and aPTT in 8 hours.  Pernell Dupre, PharmD, BCPS Clinical Pharmacist 01/18/2018 5:06 AM

## 2018-01-18 NOTE — Progress Notes (Signed)
[facility]  SUBJECTIVE: Patient laying in bed, denies chest pain or shortness of breath   Vitals:   01/17/18 1700 01/17/18 1954 01/18/18 0506 01/18/18 0829  BP:  120/74 (!) 117/59 (!) 141/80  Pulse: 68 78 (!) 59 (!) 110  Resp:  17 18   Temp:  98.5 F (36.9 C) 98.4 F (36.9 C) 98.4 F (36.9 C)  TempSrc:  Oral Oral Oral  SpO2:  97% 96% 100%  Weight:      Height:         Intake/Output Summary (Last 24 hours) at 01/18/2018 0841 Last data filed at 01/18/2018 0555 Gross per 24 hour  Intake 832.62 ml  Output 250 ml  Net 582.62 ml      PHYSICAL EXAM  General: Well developed, well nourished, in no acute distress HEENT:  Normocephalic and atramatic Neck:  No JVD.  Lungs: Clear bilaterally to auscultation and percussion. Heart: HRRR . Normal S1 and S2 without gallops or murmurs.  Abdomen: Bowel sounds are positive, abdomen soft and non-tender  Msk:  Back normal, normal gait. Normal strength and tone for age. Extremities:  Left BKA Neuro: Alert and oriented X 3. Psych:  Good affect, responds appropriately   LABS: Basic Metabolic Panel: Recent Labs    01/17/18 0055 01/18/18 0417  NA 138 140  K 3.5 4.2  CL 106 104  CO2 25 28  GLUCOSE 188* 134*  BUN 22 28*  CREATININE 0.91 1.13  CALCIUM 8.4* 8.3*   Liver Function Tests: No results for input(s): AST, ALT, ALKPHOS, BILITOT, PROT, ALBUMIN in the last 72 hours. No results for input(s): LIPASE, AMYLASE in the last 72 hours. CBC: Recent Labs    01/17/18 0055 01/18/18 0417  WBC 7.5 7.5  HGB 12.5* 11.9*  HCT 36.9* 34.9*  MCV 84.7 85.1  PLT 211 194   Cardiac Enzymes: Recent Labs    01/16/18 1334 01/16/18 1850 01/17/18 0055  TROPONINI 0.29* 0.58* 0.46*   BNP: Invalid input(s): POCBNP D-Dimer: No results for input(s): DDIMER in the last 72 hours. Hemoglobin A1C: Recent Labs    01/16/18 1658  HGBA1C 6.7*   Fasting Lipid Panel: No results for input(s): CHOL, HDL, LDLCALC, TRIG, CHOLHDL, LDLDIRECT in  the last 72 hours. Thyroid Function Tests: No results for input(s): TSH, T4TOTAL, T3FREE, THYROIDAB in the last 72 hours.  Invalid input(s): FREET3 Anemia Panel: No results for input(s): VITAMINB12, FOLATE, FERRITIN, TIBC, IRON, RETICCTPCT in the last 72 hours.  Dg Chest 2 View  Result Date: 01/16/2018 CLINICAL DATA:  Chest pain and nausea for several hours EXAM: CHEST - 2 VIEW COMPARISON:  11/23/2017 FINDINGS: Cardiac shadow is at the upper limits of normal in size. Aortic calcifications are noted. Mild interstitial changes are seen consistent with a degree of interstitial edema. No sizable effusion is seen. No focal infiltrate is noted. IMPRESSION: Mild interstitial edema.  No acute infiltrate is noted. Electronically Signed   By: Inez Catalina M.D.   On: 01/16/2018 11:05     Echo V EF 40% 11/24/2017  TELEMETRY: Atrial fibrillation with controlled ventricular rate:  ASSESSMENT AND PLAN:  Active Problems:   NSTEMI (non-ST elevated myocardial infarction) ([place])    1.  Pain with atypical features, mildly elevated troponin, with known ischemic cardiomyopathy, known coronary artery disease.  Patient not felt to be a good candidate for cardiac catheterization, wishes to pursue continued conservative management without cardiac catheterization. 2.  Ischemic cardiomyopathy 3.  Chronic systolic congestive heart failure, stable 4.  Atrial fibrillation,  on Xarelto for stroke prevention  Recommendations  1.  DC heparin drip 2.  Resume Xarelto at lower dose 15 mg daily 3.  Defer cardiac catheterization 4.  May discharge home later today   Isaias Cowman, MD, PhD, [facility] 01/18/2018 8:41 AM

## 2018-01-23 ENCOUNTER — Telehealth: Payer: Self-pay

## 2018-01-23 NOTE — Telephone Encounter (Signed)
EMMI Follow-up: Noted on the report that the patient wasn't sure if he got his discharge paperwork and didn't know if he had follow-up appointments.  I talked with [name] and I asked if he recalled getting a white manlia envelope with his after visit summary and he wasn't sure where it was of if he receivid it.  He asked the name of heart medication (IMDUR 30 mg, once a day) and his wife is 20 yrs. old and tries to help him take the correct medications each day.  He says he has so many medications and can't keep track of what he has to take and when.  I suggested he contact [facility] to help them with medication reconciliation so he was sure to be taking the correct medication and remove the ones he no longer needs to take.  We said he would follow-up with them this week as it was all confusing.  He said the RN that comes the house said he should get a pill box but she wasn't unable to help him go through the medications. We also reviewed his follow-up appointments and asked his wife to make note of them.  I gave him directions to wound care center and medication management and he was very appreciative. I let him know there would be a second automated call with a different series of questions and to let us know if he had any concerns at that time.  I wished him well with getting the medications straightened out and let him know he could drop off any unused/expired medications at the police station in [place] and [place] in [place]. He asked if they had a drop box in [place] but I was not sure on that.

## 2018-01-25 ENCOUNTER — Ambulatory Visit: Payer: Medicare Other | Admitting: Urology

## 2018-01-27 ENCOUNTER — Ambulatory Visit (INDEPENDENT_AMBULATORY_CARE_PROVIDER_SITE_OTHER): Payer: Medicare Other | Admitting: Vascular Surgery

## 2018-01-27 ENCOUNTER — Ambulatory Visit (INDEPENDENT_AMBULATORY_CARE_PROVIDER_SITE_OTHER): Payer: Medicare Other

## 2018-01-27 ENCOUNTER — Encounter (INDEPENDENT_AMBULATORY_CARE_PROVIDER_SITE_OTHER): Payer: Self-pay | Admitting: Vascular Surgery

## 2018-01-27 VITALS — BP 129/73 | HR 52 | Resp 16 | Ht 67.0 in | Wt 159.0 lb

## 2018-01-27 DIAGNOSIS — E119 Type 2 diabetes mellitus without complications: Secondary | ICD-10-CM

## 2018-01-27 DIAGNOSIS — I70269 Atherosclerosis of native arteries of extremities with gangrene, unspecified extremity: Secondary | ICD-10-CM

## 2018-01-27 DIAGNOSIS — I482 Chronic atrial fibrillation, unspecified: Secondary | ICD-10-CM

## 2018-01-27 DIAGNOSIS — I10 Essential (primary) hypertension: Secondary | ICD-10-CM | POA: Diagnosis not present

## 2018-01-27 DIAGNOSIS — Z794 Long term (current) use of insulin: Secondary | ICD-10-CM

## 2018-01-27 DIAGNOSIS — I7025 Atherosclerosis of native arteries of other extremities with ulceration: Secondary | ICD-10-CM | POA: Diagnosis not present

## 2018-01-27 NOTE — Progress Notes (Signed)
MRN : [id number]  [name] is a 83 y.o. (02-Nov-1934) male who presents with chief complaint of  Chief Complaint  Patient presents with  . Follow-up    5-16monthabi  .  History of Present Illness: Patient returns today in follow up of PAD and ulceration.  He has already lost his left leg but is walking with the below-knee amputation prosthesis.  He has a nonhealing ulceration of the right medial lower leg which has been present for many months.  We had discussed an angiogram earlier this year, but he had been hospitalized with multiple other issues and had had a stroke and was not felt to be a candidate for the procedure by anesthesia at that time.  He has required anesthesia because he has been unable to lay still for previous angiograms even with moderate sedation.  His wound is really not getting a lot better.  He is scheduled to see a wound care specialist in about 2 weeks.  He apparently had a possible MI a couple of weeks ago although according to the patient his heart doctor says he did not.  No recurrent stroke symptoms.  Current Outpatient Medications  Medication Sig Dispense Refill  . aspirin EC 81 MG tablet Take 81 mg by mouth daily.    .Marland Kitchenatorvastatin (LIPITOR) 80 MG tablet Take 80 mg by mouth daily at 6 PM.     . collagenase (SANTYL) ointment Apply topically daily. Right leg wound 15 g 0  . digoxin (LANOXIN) 0.125 MG tablet Take 1 tablet (0.125 mg total) by mouth daily. 30 tablet 2  . diltiazem (CARDIZEM CD) 120 MG 24 hr capsule Take 1 capsule (120 mg total) by mouth daily. 30 capsule 0  . empagliflozin (JARDIANCE) 25 MG TABS tablet Take 25 mg by mouth daily.    . furosemide (LASIX) 20 MG tablet Take 1 tablet (20 mg total) by mouth daily. 30 tablet 2  . insulin glargine (LANTUS) 100 UNIT/ML injection Inject 0.1 mLs (10 Units total) into the skin at bedtime. (Patient taking differently: Inject 15 Units into the skin at bedtime. ) 10 mL 11  . insulin lispro (HUMALOG) 100  UNIT/ML injection Inject 0.03 mLs (3 Units total) into the skin 3 (three) times daily with meals. (Patient taking differently: Inject 3 Units into the skin 3 (three) times daily with meals. Plus 1 additional unit for every 50 BG points over 200 (max 5 additional units per dose)) 10 mL 0  . isosorbide mononitrate (IMDUR) 30 MG 24 hr tablet Take 1 tablet (30 mg total) by mouth daily. 30 tablet 2  . lisinopril (PRINIVIL,ZESTRIL) 2.5 MG tablet Take 1 tablet (2.5 mg total) by mouth daily. 30 tablet 0  . metoprolol tartrate (LOPRESSOR) 25 MG tablet Take 1 tablet (25 mg total) by mouth 3 (three) times daily. 90 tablet 0  . potassium chloride SA (K-DUR,KLOR-CON) 20 MEQ tablet Take 20 mEq by mouth daily.    . risperiDONE (RISPERDAL) 0.5 MG tablet Take 1 tablet (0.5 mg total) by mouth at bedtime. 30 tablet 0  . rivaroxaban (XARELTO) 20 MG TABS tablet Take 1 tablet (20 mg total) by mouth daily with supper. 30 tablet 0  . polyethylene glycol (MIRALAX / GLYCOLAX) packet Take 17 g by mouth daily. (Patient not taking: Reported on 01/16/2018) 30 each 0   No current facility-administered medications for this visit.     Past Medical History:  Diagnosis Date  . Arthritis   . Atrial fibrillation ([place]   .  Carcinoma of prostate ([place])   . CHF (congestive heart failure) ([place])   . Chronic kidney disease   . Diabetes mellitus without complication ([place])   . ED (erectile dysfunction)   . Frequent urination   . Hyperlipidemia   . Hypertension   . Moderate mitral insufficiency   . Peripheral vascular disease ([place])   . Pneumonia 04/2016  . Prostate cancer ([place])   . Sleep apnea    OSA--USE C-PAP  . Ulcer of left foot due to type 2 diabetes mellitus ([place])   . Urinary stress incontinence, male     Past Surgical History:  Procedure Laterality Date  . AMPUTATION Left 01/12/2017   Procedure: AMPUTATION BELOW KNEE;  Surgeon: Algernon Huxley, MD;  Location: [facility];  Service: General;  Laterality: Left;  . APLIGRAFT  PLACEMENT Left 10/15/2016   Procedure: APLIGRAFT PLACEMENT;  Surgeon: Albertine Patricia, DPM;  Location: [facility];  Service: Podiatry;  Laterality: Left;  necrotic ulcer  . CHOLECYSTECTOMY    . EYE SURGERY Bilateral    Cataract Extraction with IOL  . IRRIGATION AND DEBRIDEMENT FOOT Left 10/15/2016   Procedure: IRRIGATION AND DEBRIDEMENT FOOT-EXCISIONAL DEBRIDEMENT OF SKIN 3RD, 4TH AND 5TH TOES WITH APPLICATION OF APLIGRAFT;  Surgeon: Albertine Patricia, DPM;  Location: [facility];  Service: Podiatry;  Laterality: Left;  necrotic,gangrene  . IRRIGATION AND DEBRIDEMENT FOOT Left 12/09/2016   Procedure: IRRIGATION AND DEBRIDEMENT FOOT-MUSCLE/FASCIA, RESECTION OF FOURTH AND FIFTH METATARSAL NECROTIC BONE;  Surgeon: Albertine Patricia, DPM;  Location: [facility];  Service: Podiatry;  Laterality: Left;  . LOWER EXTREMITY ANGIOGRAPHY Left 08/16/2016   Procedure: Lower Extremity Angiography;  Surgeon: Algernon Huxley, MD;  Location: [place] [facility];  Service: Cardiovascular;  Laterality: Left;  . LOWER EXTREMITY ANGIOGRAPHY Left 09/01/2016   Procedure: Lower Extremity Angiography;  Surgeon: Algernon Huxley, MD;  Location: [place] [facility];  Service: Cardiovascular;  Laterality: Left;  . LOWER EXTREMITY ANGIOGRAPHY Left 10/14/2016   Procedure: Lower Extremity Angiography;  Surgeon: Algernon Huxley, MD;  Location: [place] [facility];  Service: Cardiovascular;  Laterality: Left;  . LOWER EXTREMITY ANGIOGRAPHY Left 12/20/2016   Procedure: Lower Extremity Angiography;  Surgeon: Algernon Huxley, MD;  Location: [place] [facility];  Service: Cardiovascular;  Laterality: Left;  . LOWER EXTREMITY INTERVENTION  09/01/2016   Procedure: Lower Extremity Intervention;  Surgeon: Algernon Huxley, MD;  Location: [place] [facility];  Service: Cardiovascular;;  . PROSTATE SURGERY     removal  . TONSILLECTOMY     as a child    Family History  Problem Relation Age of Onset  . Prostate cancer Neg Hx   . Bladder Cancer Neg Hx   . Kidney  disease Neg Hx   No bleeding disorders, clotting disorders, autoimmune diseases, or aneurysms  Social History      Social History  Substance Use Topics  . Smoking status: Former Smoker    Quit date: 10/14/1994  . Smokeless tobacco: Never Used  . Alcohol use No  Married, wife accompanies today        Allergies  Allergen Reactions  . Contrast Media [Iodinated Diagnostic Agents]     SOB  . Iohexol     Desc: Respiratory Distress, Laryngedema, diaphoresis, Onset Date: [id number]   . Latex Rash       REVIEW OF SYSTEMS(Negative unless checked)  Constitutional: '[]' Weight loss'[]' Fever'[]' Chills Cardiac:'[]' Chest pain'[]' Chest pressure'[x]' Palpitations '[]' Shortness of breath when laying flat '[]' Shortness of breath at rest '[x]' Shortness of breath with exertion. Vascular: '[x]' Pain  in legs with walking'[]' Pain in legsat rest'[]' Pain in legs when laying flat '[]' Claudication '[x]' Pain in feet when walking '[x]' Pain in feet at rest '[x]' Pain in feet when laying flat '[]' History of DVT '[]' Phlebitis '[x]' Swelling in legs '[]' Varicose veins '[x]' Non-healing ulcers Pulmonary: '[]' Uses home oxygen '[x]' Productive cough'[]' Hemoptysis '[]' Wheeze '[]' COPD '[]' Asthma Neurologic: '[]' Dizziness '[]' Blackouts '[]' Seizures '[]' History of stroke '[]' History of TIA'[]' Aphasia '[]' Temporary blindness'[]' Dysphagia '[]' Weaknessor numbness in arms '[]' Weakness or numbnessin legs Musculoskeletal: '[x]' Arthritis '[]' Joint swelling '[]' Joint pain '[]' Low back pain Hematologic:'[]' Easy bruising'[]' Easy bleeding '[]' Hypercoagulable state '[]' Anemic '[]' Hepatitis Gastrointestinal:'[]' Blood in stool'[]' Vomiting blood'[]' Gastroesophageal reflux/heartburn'[]' Abdominal pain Genitourinary: '[]' Chronic kidney disease '[]' Difficulturination '[]' Frequenturination '[]' Burning with urination'[]' Hematuria Skin: '[]' Rashes '[x]' Ulcers '[x]' Wounds Psychological: '[]' History of anxiety'[]' History  of major depression.   Physical Examination  BP 129/73 (BP Location: Right Arm)   Pulse (!) 52   Resp 16   Ht '5\' 7"'  (1.702 m)   Wt 159 lb (72.1 kg)   BMI 24.90 kg/m  Gen:  WD/WN, NAD Head: [place]/AT, No temporalis wasting. Ear/Nose/Throat: Hearing grossly intact, nares w/o erythema or drainage Eyes: Conjunctiva clear. Sclera non-icteric Neck: Supple.  Trachea midline Pulmonary:  Good air movement, no use of accessory muscles.  Cardiac: Irregularly irregular Vascular:  Vessel Right Left  Radial Palpable Palpable                          PT  not palpable  not palpable  DP  1+ palpable  not palpable   Gastrointestinal: soft, non-tender/non-distended.  Musculoskeletal: M/S 5/5 throughout.  Left BKA.  Approximately 3 cm circular ulceration just above the medial right ankle with pale fibrinous exudate and poor granulation tissue.  Mild surrounding erythema. Neurologic: Sensation grossly intact in extremities.  Symmetrical.  Speech is fluent.  Psychiatric: Judgment and insight appear fair at best.  He is not a great historian Dermatologic: Ulcer as described above on the right lower leg       Labs Recent Results (from the past 2160 hour(s))  CBC with Differential     Status: Abnormal   Collection Time: 11/23/17 10:56 AM  Result Value Ref Range   WBC 9.8 3.8 - 10.6 K/uL   RBC 4.33 (L) 4.40 - 5.90 MIL/uL   Hemoglobin 13.1 13.0 - 18.0 g/dL   HCT 36.7 (L) 40.0 - 52.0 %   MCV 84.9 80.0 - 100.0 fL   MCH 30.3 26.0 - 34.0 pg   MCHC 35.7 32.0 - 36.0 g/dL   RDW 19.0 (H) 11.5 - 14.5 %   Platelets 368 150 - 440 K/uL   Neutrophils Relative % 82 %   Neutro Abs 8.1 (H) 1.4 - 6.5 K/uL   Lymphocytes Relative 9 %   Lymphs Abs 0.9 (L) 1.0 - 3.6 K/uL   Monocytes Relative 6 %   Monocytes Absolute 0.6 0.2 - 1.0 K/uL   Eosinophils Relative 2 %   Eosinophils Absolute 0.2 0 - 0.7 K/uL   Basophils Relative 1 %   Basophils Absolute 0.1 0 - 0.1 K/uL    Comment: Performed at [facility], [address] [place], [place] [address]  Brain natriuretic peptide     Status: Abnormal   Collection Time: 11/23/17 10:56 AM  Result Value Ref Range   B Natriuretic Peptide 1,899.0 (H) 0.0 - 100.0 pg/mL    Comment: Performed at [facility], [address] [place], [place] [address]  Troponin I     Status: Abnormal   Collection Time: 11/23/17 10:56 AM  Result Value Ref Range   Troponin I 0.07 (HH) <  0.03 ng/mL    Comment: CRITICAL RESULT CALLED TO, READ BACK BY AND VERIFIED WITH SAMANTHA HAMELTON '@[id number]'  11/23/17 MGP Performed at [facility], [address] [place], [place] [address]   Blood gas, venous     Status: None   Collection Time: 11/23/17 10:56 AM  Result Value Ref Range   pH, Ven 7.37 7.250 - 7.430   pCO2, Ven 44 44.0 - 60.0 mmHg   pO2, Ven 32.0 32.0 - 45.0 mmHg   Patient temperature 37.0    Collection site VENOUS    Sample type VENOUS     Comment: Performed at [facility], [address] [place], [place] [address]  Comprehensive metabolic panel     Status: Abnormal   Collection Time: 11/23/17 10:56 AM  Result Value Ref Range   Sodium 141 135 - 145 mmol/L   Potassium 4.7 3.5 - 5.1 mmol/L    Comment: HEMOLYSIS AT THIS LEVEL MAY AFFECT RESULT   Chloride 110 98 - 111 mmol/L    Comment: Please note change in reference range.   CO2 23 22 - 32 mmol/L   Glucose, Bld 111 (H) 70 - 99 mg/dL    Comment: Please note change in reference range.   BUN 40 (H) 8 - 23 mg/dL    Comment: Please note change in reference range.   Creatinine, Ser 0.93 0.61 - 1.24 mg/dL   Calcium 8.7 (L) 8.9 - 10.3 mg/dL   Total Protein 6.8 6.5 - 8.1 g/dL   Albumin 3.2 (L) 3.5 - 5.0 g/dL   AST 36 15 - 41 U/L   ALT 30 0 - 44 U/L    Comment: Please note change in reference range.   Alkaline Phosphatase 241 (H) 38 - 126 U/L   Total Bilirubin 1.7 (H) 0.3 - 1.2 mg/dL   GFR calc non Af Amer >60 >60 mL/min   GFR calc Af Amer >60 >60 mL/min    Comment:  (NOTE) The eGFR has been calculated using the CKD EPI equation. This calculation has not been validated in all clinical situations. eGFR's persistently <60 mL/min signify possible Chronic Kidney Disease.    Anion gap 8 5 - 15    Comment: Performed at [facility], [address] [place], [place] [address]  Troponin I     Status: Abnormal   Collection Time: 11/23/17  2:04 PM  Result Value Ref Range   Troponin I 0.11 (HH) <0.03 ng/mL    Comment: CRITICAL VALUE NOTED. VALUE IS CONSISTENT WITH PREVIOUSLY REPORTED/CALLED VALUE MGP Performed at [facility], [address] [place], [place] [address]   HIV antibody (Routine Screening)     Status: None   Collection Time: 11/23/17  2:04 PM  Result Value Ref Range   HIV Screen 4th Generation wRfx Non Reactive Non Reactive    Comment: (NOTE) Performed At: [facility] [place], [place] [id number] Rush Farmer MD AF:[id number] Performed at [facility], [address] [place], [place] [address]   Culture, blood (routine x 2) Call MD if unable to obtain prior to antibiotics being given     Status: None   Collection Time: 11/23/17  2:04 PM  Result Value Ref Range   Specimen Description BLOOD RIGHT ANTECUBITAL    Special Requests      BOTTLES DRAWN AEROBIC AND ANAEROBIC Blood Culture adequate volume   Culture      NO GROWTH 5 DAYS Performed at [facility], [address],  [place], [place] [address]    Report Status 11/28/2017 FINAL   Culture, blood (routine x 2) Call MD if unable to obtain prior to antibiotics being given     Status: None   Collection Time: 11/23/17  2:14 PM  Result Value Ref Range   Specimen Description BLOOD BLOOD RIGHT WRIST    Special Requests      BOTTLES DRAWN AEROBIC AND ANAEROBIC Blood Culture adequate volume   Culture      NO GROWTH 5 DAYS Performed at [facility], [address] [place], [place] [address]    Report Status  11/28/2017 FINAL   Influenza panel by PCR (type A & B)     Status: None   Collection Time: 11/23/17  2:56 PM  Result Value Ref Range   Influenza A By PCR NEGATIVE NEGATIVE   Influenza B By PCR NEGATIVE NEGATIVE    Comment: (NOTE) The Xpert Xpress Flu assay is intended as an aid in the diagnosis of  influenza and should not be used as a sole basis for treatment.  This  assay is FDA approved for nasopharyngeal swab specimens only. Nasal  washings and aspirates are unacceptable for Xpert Xpress Flu testing. Performed at [facility], [address] [place], [place] [address]   MRSA PCR Screening     Status: None   Collection Time: 11/23/17  2:56 PM  Result Value Ref Range   MRSA by PCR NEGATIVE NEGATIVE    Comment:        The GeneXpert MRSA Assay (FDA approved for NASAL specimens only), is one component of a comprehensive MRSA colonization surveillance program. It is not intended to diagnose MRSA infection nor to guide or monitor treatment for MRSA infections. Performed at [facility], [address] [place], [place] [address]   Glucose, capillary     Status: Abnormal   Collection Time: 11/23/17  4:42 PM  Result Value Ref Range   Glucose-Capillary 210 (H) 70 - 99 mg/dL   Comment 1 Notify RN    Comment 2 Document in Chart   Strep pneumoniae urinary antigen     Status: None   Collection Time: 11/23/17  6:02 PM  Result Value Ref Range   Strep Pneumo Urinary Antigen NEGATIVE NEGATIVE    Comment:        Infection due to S. pneumoniae cannot be absolutely ruled out since the antigen present may be below the detection limit of the test.   Troponin I     Status: Abnormal   Collection Time: 11/23/17  8:00 PM  Result Value Ref Range   Troponin I 0.19 (HH) <0.03 ng/mL    Comment: CRITICAL VALUE NOTED. VALUE IS CONSISTENT WITH PREVIOUSLY REPORTED/CALLED VALUE / FLC Performed at [facility], [address] [place], [place] [address]   Glucose,  capillary     Status: Abnormal   Collection Time: 11/23/17  8:33 PM  Result Value Ref Range   Glucose-Capillary 151 (H) 70 - 99 mg/dL  Troponin I     Status: Abnormal   Collection Time: 11/24/17  2:15 AM  Result Value Ref Range   Troponin I 0.21 (HH) <0.03 ng/mL    Comment: CRITICAL VALUE NOTED. VALUE IS CONSISTENT WITH PREVIOUSLY REPORTED/CALLED VALUE / JAG Performed at [facility], [address] [place],  [address]   Basic metabolic panel     Status: Abnormal   Collection Time: 11/24/17  2:15 AM  Result Value Ref Range   Sodium  142 135 - 145 mmol/L   Potassium 3.9 3.5 - 5.1 mmol/L   Chloride 107 98 - 111 mmol/L    Comment: Please note change in reference range.   CO2 29 22 - 32 mmol/L   Glucose, Bld 143 (H) 70 - 99 mg/dL    Comment: Please note change in reference range.   BUN 37 (H) 8 - 23 mg/dL    Comment: Please note change in reference range.   Creatinine, Ser 1.18 0.61 - 1.24 mg/dL   Calcium 8.8 (L) 8.9 - 10.3 mg/dL   GFR calc non Af Amer 56 (L) >60 mL/min   GFR calc Af Amer >60 >60 mL/min    Comment: (NOTE) The eGFR has been calculated using the CKD EPI equation. This calculation has not been validated in all clinical situations. eGFR's persistently <60 mL/min signify possible Chronic Kidney Disease.    Anion gap 6 5 - 15    Comment: Performed at [facility], [address] [place], [place] [address]  Brain natriuretic peptide     Status: Abnormal   Collection Time: 11/24/17  2:15 AM  Result Value Ref Range   B Natriuretic Peptide 2,390.0 (H) 0.0 - 100.0 pg/mL    Comment: Performed at [facility], [address] [place], [place] [address]  TSH     Status: None   Collection Time: 11/24/17  2:15 AM  Result Value Ref Range   TSH 2.974 0.350 - 4.500 uIU/mL    Comment: Performed by a 3rd Generation assay with a functional sensitivity of <=0.01 uIU/mL. Performed at [facility], [address] [place], [place]  [address]   Procalcitonin - Baseline     Status: None   Collection Time: 11/24/17  2:15 AM  Result Value Ref Range   Procalcitonin <0.10 ng/mL    Comment:        Interpretation: PCT (Procalcitonin) <= 0.5 ng/mL: Systemic infection (sepsis) is not likely. Local bacterial infection is possible. (NOTE)       Sepsis PCT Algorithm           Lower Respiratory Tract                                      Infection PCT Algorithm    ----------------------------     ----------------------------         PCT < 0.25 ng/mL                PCT < 0.10 ng/mL         Strongly encourage             Strongly discourage   discontinuation of antibiotics    initiation of antibiotics    ----------------------------     -----------------------------       PCT 0.25 - 0.50 ng/mL            PCT 0.10 - 0.25 ng/mL               OR       >80% decrease in PCT            Discourage initiation of                                            antibiotics  Encourage discontinuation           of antibiotics    ----------------------------     -----------------------------         PCT >= 0.50 ng/mL              PCT 0.26 - 0.50 ng/mL               AND        <80% decrease in PCT             Encourage initiation of                                             antibiotics       Encourage continuation           of antibiotics    ----------------------------     -----------------------------        PCT >= 0.50 ng/mL                  PCT > 0.50 ng/mL               AND         increase in PCT                  Strongly encourage                                      initiation of antibiotics    Strongly encourage escalation           of antibiotics                                     -----------------------------                                           PCT <= 0.25 ng/mL                                                 OR                                        > 80% decrease in PCT                                      Discontinue / Do not initiate                                             antibiotics Performed at [facility], [address] [place], [place] [address]   Glucose, capillary     Status: Abnormal   Collection Time: 11/24/17  7:34 AM  Result Value Ref  Range   Glucose-Capillary 154 (H) 70 - 99 mg/dL  Glucose, capillary     Status: Abnormal   Collection Time: 11/24/17 11:25 AM  Result Value Ref Range   Glucose-Capillary 161 (H) 70 - 99 mg/dL  ECHOCARDIOGRAM COMPLETE     Status: None   Collection Time: 11/24/17 12:10 PM  Result Value Ref Range   Weight 2,694.4 oz   Height 67 in   BP 143/66 mmHg  Glucose, capillary     Status: Abnormal   Collection Time: 11/24/17  4:27 PM  Result Value Ref Range   Glucose-Capillary 199 (H) 70 - 99 mg/dL  Glucose, capillary     Status: Abnormal   Collection Time: 11/24/17  8:53 PM  Result Value Ref Range   Glucose-Capillary 172 (H) 70 - 99 mg/dL  Aerobic/Anaerobic Culture (surgical/deep wound)     Status: None   Collection Time: 11/24/17 10:04 PM  Result Value Ref Range   Specimen Description      WOUND RIGHT LEG Performed at [facility], [address] [place], [place] [address]    Special Requests      NONE Performed at [facility], [address] [place], [place] [address]    Gram Stain      RARE WBC PRESENT, PREDOMINANTLY PMN MODERATE GRAM POSITIVE COCCI IN PAIRS IN CLUSTERS RARE GRAM POSITIVE RODS RARE GRAM NEGATIVE RODS RARE BUDDING YEAST SEEN    Culture      ABUNDANT METHICILLIN RESISTANT STAPHYLOCOCCUS AUREUS WITHIN MIXED CULTURE NO ANAEROBES ISOLATED Performed at [facility], [address], [place], [place] [address]    Report Status 11/30/2017 FINAL    Organism ID, Bacteria METHICILLIN RESISTANT STAPHYLOCOCCUS AUREUS       Susceptibility   Methicillin resistant staphylococcus aureus - MIC*    CIPROFLOXACIN >=8 RESISTANT Resistant     ERYTHROMYCIN >=8 RESISTANT Resistant      GENTAMICIN <=0.5 SENSITIVE Sensitive     OXACILLIN >=4 RESISTANT Resistant     TETRACYCLINE <=1 SENSITIVE Sensitive     VANCOMYCIN <=0.5 SENSITIVE Sensitive     TRIMETH/SULFA >=320 RESISTANT Resistant     CLINDAMYCIN <=0.25 SENSITIVE Sensitive     RIFAMPIN <=0.5 SENSITIVE Sensitive     Inducible Clindamycin NEGATIVE Sensitive     * ABUNDANT METHICILLIN RESISTANT STAPHYLOCOCCUS AUREUS  Basic metabolic panel     Status: Abnormal   Collection Time: 11/25/17  5:58 AM  Result Value Ref Range   Sodium 140 135 - 145 mmol/L   Potassium 3.8 3.5 - 5.1 mmol/L   Chloride 103 98 - 111 mmol/L    Comment: Please note change in reference range.   CO2 26 22 - 32 mmol/L   Glucose, Bld 183 (H) 70 - 99 mg/dL    Comment: Please note change in reference range.   BUN 40 (H) 8 - 23 mg/dL    Comment: Please note change in reference range.   Creatinine, Ser 1.04 0.61 - 1.24 mg/dL   Calcium 8.3 (L) 8.9 - 10.3 mg/dL   GFR calc non Af Amer >60 >60 mL/min   GFR calc Af Amer >60 >60 mL/min    Comment: (NOTE) The eGFR has been calculated using the CKD EPI equation. This calculation has not been validated in all clinical situations. eGFR's persistently <60 mL/min signify possible Chronic Kidney Disease.    Anion gap 11 5 - 15    Comment: Performed at [facility], [address] [place], [place] [address]  Procalcitonin  Status: None   Collection Time: 11/25/17  5:58 AM  Result Value Ref Range   Procalcitonin <0.10 ng/mL    Comment:        Interpretation: PCT (Procalcitonin) <= 0.5 ng/mL: Systemic infection (sepsis) is not likely. Local bacterial infection is possible. (NOTE)       Sepsis PCT Algorithm           Lower Respiratory Tract                                      Infection PCT Algorithm    ----------------------------     ----------------------------         PCT < 0.25 ng/mL                PCT < 0.10 ng/mL         Strongly encourage             Strongly discourage    discontinuation of antibiotics    initiation of antibiotics    ----------------------------     -----------------------------       PCT 0.25 - 0.50 ng/mL            PCT 0.10 - 0.25 ng/mL               OR       >80% decrease in PCT            Discourage initiation of                                            antibiotics      Encourage discontinuation           of antibiotics    ----------------------------     -----------------------------         PCT >= 0.50 ng/mL              PCT 0.26 - 0.50 ng/mL               AND        <80% decrease in PCT             Encourage initiation of                                             antibiotics       Encourage continuation           of antibiotics    ----------------------------     -----------------------------        PCT >= 0.50 ng/mL                  PCT > 0.50 ng/mL               AND         increase in PCT                  Strongly encourage                                      initiation of antibiotics    Strongly  encourage escalation           of antibiotics                                     -----------------------------                                           PCT <= 0.25 ng/mL                                                 OR                                        > 80% decrease in PCT                                     Discontinue / Do not initiate                                             antibiotics Performed at [facility], [address] [place], [place] [address]   Glucose, capillary     Status: Abnormal   Collection Time: 11/25/17  8:03 AM  Result Value Ref Range   Glucose-Capillary 174 (H) 70 - 99 mg/dL  Glucose, capillary     Status: Abnormal   Collection Time: 11/25/17 11:40 AM  Result Value Ref Range   Glucose-Capillary 311 (H) 70 - 99 mg/dL  Glucose, capillary     Status: Abnormal   Collection Time: 11/25/17  4:44 PM  Result Value Ref Range   Glucose-Capillary 133 (H) 70 - 99 mg/dL   Respiratory Panel by PCR     Status: None   Collection Time: 11/25/17  7:48 PM  Result Value Ref Range   Adenovirus NOT DETECTED NOT DETECTED   Coronavirus 229E NOT DETECTED NOT DETECTED   Coronavirus HKU1 NOT DETECTED NOT DETECTED   Coronavirus NL63 NOT DETECTED NOT DETECTED   Coronavirus OC43 NOT DETECTED NOT DETECTED   Metapneumovirus NOT DETECTED NOT DETECTED   Rhinovirus / Enterovirus NOT DETECTED NOT DETECTED   Influenza A NOT DETECTED NOT DETECTED   Influenza B NOT DETECTED NOT DETECTED   Parainfluenza Virus 1 NOT DETECTED NOT DETECTED   Parainfluenza Virus 2 NOT DETECTED NOT DETECTED   Parainfluenza Virus 3 NOT DETECTED NOT DETECTED   Parainfluenza Virus 4 NOT DETECTED NOT DETECTED   Respiratory Syncytial Virus NOT DETECTED NOT DETECTED   Bordetella pertussis NOT DETECTED NOT DETECTED   Chlamydophila pneumoniae NOT DETECTED NOT DETECTED   Mycoplasma pneumoniae NOT DETECTED NOT DETECTED    Comment: Performed at [facility], [address], [place], [place] [address]  Glucose, capillary     Status: Abnormal   Collection Time: 11/25/17  9:07 PM  Result Value Ref Range   Glucose-Capillary 158 (H) 70 - 99 mg/dL  Basic metabolic panel  Status: Abnormal   Collection Time: 11/26/17  4:00 AM  Result Value Ref Range   Sodium 138 135 - 145 mmol/L   Potassium 3.8 3.5 - 5.1 mmol/L   Chloride 101 98 - 111 mmol/L    Comment: Please note change in reference range.   CO2 27 22 - 32 mmol/L   Glucose, Bld 166 (H) 70 - 99 mg/dL    Comment: Please note change in reference range.   BUN 44 (H) 8 - 23 mg/dL    Comment: Please note change in reference range.   Creatinine, Ser 1.13 0.61 - 1.24 mg/dL   Calcium 8.0 (L) 8.9 - 10.3 mg/dL   GFR calc non Af Amer 59 (L) >60 mL/min   GFR calc Af Amer >60 >60 mL/min    Comment: (NOTE) The eGFR has been calculated using the CKD EPI equation. This calculation has not been validated in all clinical situations. eGFR's persistently <60 mL/min  signify possible Chronic Kidney Disease.    Anion gap 10 5 - 15    Comment: Performed at [facility], [address] [place], [place] [address]  Procalcitonin     Status: None   Collection Time: 11/26/17  4:00 AM  Result Value Ref Range   Procalcitonin <0.10 ng/mL    Comment:        Interpretation: PCT (Procalcitonin) <= 0.5 ng/mL: Systemic infection (sepsis) is not likely. Local bacterial infection is possible. (NOTE)       Sepsis PCT Algorithm           Lower Respiratory Tract                                      Infection PCT Algorithm    ----------------------------     ----------------------------         PCT < 0.25 ng/mL                PCT < 0.10 ng/mL         Strongly encourage             Strongly discourage   discontinuation of antibiotics    initiation of antibiotics    ----------------------------     -----------------------------       PCT 0.25 - 0.50 ng/mL            PCT 0.10 - 0.25 ng/mL               OR       >80% decrease in PCT            Discourage initiation of                                            antibiotics      Encourage discontinuation           of antibiotics    ----------------------------     -----------------------------         PCT >= 0.50 ng/mL              PCT 0.26 - 0.50 ng/mL               AND        <80% decrease in PCT  Encourage initiation of                                             antibiotics       Encourage continuation           of antibiotics    ----------------------------     -----------------------------        PCT >= 0.50 ng/mL                  PCT > 0.50 ng/mL               AND         increase in PCT                  Strongly encourage                                      initiation of antibiotics    Strongly encourage escalation           of antibiotics                                     -----------------------------                                           PCT <= 0.25 ng/mL                                                  OR                                        > 80% decrease in PCT                                     Discontinue / Do not initiate                                             antibiotics Performed at [facility], [address] [place], [place] [address]   Digoxin level     Status: Abnormal   Collection Time: 11/26/17  4:00 AM  Result Value Ref Range   Digoxin Level 0.5 (L) 0.8 - 2.0 ng/mL    Comment: Performed at [facility], [address] [place], [place] [address]  Glucose, capillary     Status: Abnormal   Collection Time: 11/26/17  7:44 AM  Result Value Ref Range   Glucose-Capillary 127 (H) 70 - 99 mg/dL   Comment 1 Notify RN   Glucose, capillary     Status: Abnormal   Collection Time: 11/26/17 11:36 AM  Result Value Ref Range   Glucose-Capillary 213 (H)  70 - 99 mg/dL   Comment 1 Notify RN   Glucose, capillary     Status: Abnormal   Collection Time: 11/26/17  4:39 PM  Result Value Ref Range   Glucose-Capillary 190 (H) 70 - 99 mg/dL   Comment 1 Notify RN   Glucose, capillary     Status: Abnormal   Collection Time: 11/26/17  9:13 PM  Result Value Ref Range   Glucose-Capillary 194 (H) 70 - 99 mg/dL  Basic metabolic panel     Status: Abnormal   Collection Time: 11/27/17  4:25 AM  Result Value Ref Range   Sodium 138 135 - 145 mmol/L   Potassium 3.6 3.5 - 5.1 mmol/L   Chloride 104 98 - 111 mmol/L    Comment: Please note change in reference range.   CO2 27 22 - 32 mmol/L   Glucose, Bld 177 (H) 70 - 99 mg/dL    Comment: Please note change in reference range.   BUN 39 (H) 8 - 23 mg/dL    Comment: Please note change in reference range.   Creatinine, Ser 0.89 0.61 - 1.24 mg/dL   Calcium 8.2 (L) 8.9 - 10.3 mg/dL   GFR calc non Af Amer >60 >60 mL/min   GFR calc Af Amer >60 >60 mL/min    Comment: (NOTE) The eGFR has been calculated using the CKD EPI equation. This calculation has not been validated in all clinical  situations. eGFR's persistently <60 mL/min signify possible Chronic Kidney Disease.    Anion gap 7 5 - 15    Comment: Performed at [facility], [address] [place], [place] [address]  Glucose, capillary     Status: Abnormal   Collection Time: 11/27/17  8:07 AM  Result Value Ref Range   Glucose-Capillary 128 (H) 70 - 99 mg/dL   Comment 1 Notify RN   Glucose, capillary     Status: Abnormal   Collection Time: 11/27/17 11:55 AM  Result Value Ref Range   Glucose-Capillary 238 (H) 70 - 99 mg/dL   Comment 1 Notify RN   Glucose, capillary     Status: Abnormal   Collection Time: 11/27/17  4:18 PM  Result Value Ref Range   Glucose-Capillary 130 (H) 70 - 99 mg/dL   Comment 1 Notify RN   Glucose, capillary     Status: Abnormal   Collection Time: 11/27/17  8:30 PM  Result Value Ref Range   Glucose-Capillary 148 (H) 70 - 99 mg/dL  Basic metabolic panel     Status: Abnormal   Collection Time: 11/28/17  4:59 AM  Result Value Ref Range   Sodium 138 135 - 145 mmol/L   Potassium 4.4 3.5 - 5.1 mmol/L   Chloride 105 98 - 111 mmol/L    Comment: Please note change in reference range.   CO2 26 22 - 32 mmol/L   Glucose, Bld 129 (H) 70 - 99 mg/dL    Comment: Please note change in reference range.   BUN 36 (H) 8 - 23 mg/dL    Comment: Please note change in reference range.   Creatinine, Ser 0.73 0.61 - 1.24 mg/dL   Calcium 8.0 (L) 8.9 - 10.3 mg/dL   GFR calc non Af Amer >60 >60 mL/min   GFR calc Af Amer >60 >60 mL/min    Comment: (NOTE) The eGFR has been calculated using the CKD EPI equation. This calculation has not been validated in all clinical situations. eGFR's persistently <60 mL/min signify possible Chronic Kidney Disease.  Anion gap 7 5 - 15    Comment: Performed at [facility], [address] [place], [place] [address]  Glucose, capillary     Status: Abnormal   Collection Time: 11/28/17  8:22 AM  Result Value Ref Range   Glucose-Capillary 108 (H) 70 - 99  mg/dL   Comment 1 Notify RN    Comment 2 Document in Chart   Glucose, capillary     Status: Abnormal   Collection Time: 11/28/17 11:30 AM  Result Value Ref Range   Glucose-Capillary 260 (H) 70 - 99 mg/dL   Comment 1 Notify RN    Comment 2 Document in Chart   Basic metabolic panel     Status: Abnormal   Collection Time: 01/16/18 10:34 AM  Result Value Ref Range   Sodium 139 135 - 145 mmol/L   Potassium 4.1 3.5 - 5.1 mmol/L   Chloride 105 98 - 111 mmol/L   CO2 27 22 - 32 mmol/L   Glucose, Bld 232 (H) 70 - 99 mg/dL   BUN 26 (H) 8 - 23 mg/dL   Creatinine, Ser 0.97 0.61 - 1.24 mg/dL   Calcium 9.0 8.9 - 10.3 mg/dL   GFR calc non Af Amer >60 >60 mL/min   GFR calc Af Amer >60 >60 mL/min    Comment: (NOTE) The eGFR has been calculated using the CKD EPI equation. This calculation has not been validated in all clinical situations. eGFR's persistently <60 mL/min signify possible Chronic Kidney Disease.    Anion gap 7 5 - 15    Comment: Performed at [facility], [address] [place], [place] [address]  CBC     Status: None   Collection Time: 01/16/18 10:34 AM  Result Value Ref Range   WBC 7.9 3.8 - 10.6 K/uL   RBC 4.91 4.40 - 5.90 MIL/uL   Hemoglobin 14.1 13.0 - 18.0 g/dL   HCT 41.8 40.0 - 52.0 %   MCV 85.1 80.0 - 100.0 fL   MCH 28.7 26.0 - 34.0 pg   MCHC 33.8 32.0 - 36.0 g/dL   RDW 14.4 11.5 - 14.5 %   Platelets 232 150 - 440 K/uL    Comment: Performed at [facility], [address] [place], [place] [address]  Troponin I     Status: Abnormal   Collection Time: 01/16/18 10:34 AM  Result Value Ref Range   Troponin I 0.04 (HH) <0.03 ng/mL    Comment: CRITICAL RESULT CALLED TO, READ BACK BY AND VERIFIED WITH KIM GAULT AT 1108 01/16/18 DAS Performed at [facility], [address] [place], [place] [address] CORRECTED ON 08/26 AT 9163: PREVIOUSLY REPORTED AS 0.04 CRITICAL RESULT CALLED TO, READ BACK BY AND VERIFIED WITH KIM GAIULT AT 1108 01/16/18  DAS   Digoxin level     Status: Abnormal   Collection Time: 01/16/18 10:34 AM  Result Value Ref Range   Digoxin Level 0.4 (L) 0.8 - 2.0 ng/mL    Comment: Performed at [facility], [address] [place], [place] [address]  Brain natriuretic peptide     Status: Abnormal   Collection Time: 01/16/18 10:34 AM  Result Value Ref Range   B Natriuretic Peptide 920.0 (H) 0.0 - 100.0 pg/mL    Comment: Performed at [facility], [address] [place], [place] [address]  Troponin I     Status: Abnormal   Collection Time: 01/16/18  1:34 PM  Result Value Ref Range   Troponin I  0.29 (HH) <0.03 ng/mL    Comment: CRITICAL RESULT CALLED TO, READ BACK BY AND VERIFIED WITH [facility] HAMILTON AT 1430 01/16/18 DAS Performed at [facility], [address] [place], [place] [address]   Hemoglobin A1c     Status: Abnormal   Collection Time: 01/16/18  4:58 PM  Result Value Ref Range   Hgb A1c MFr Bld 6.7 (H) 4.8 - 5.6 %    Comment: (NOTE) Pre diabetes:          5.7%-6.4% Diabetes:              >6.4% Glycemic control for   <7.0% adults with diabetes    Mean Plasma Glucose 145.59 mg/dL    Comment: Performed at [facility] [address], [place], [place] [address]  Heparin level (unfractionated)     Status: Abnormal   Collection Time: 01/16/18  5:09 PM  Result Value Ref Range   Heparin Unfractionated 0.81 (H) 0.30 - 0.70 IU/mL    Comment: (NOTE) If heparin results are below expected values, and patient dosage has  been confirmed, suggest follow up testing of antithrombin III levels. Performed at [facility], [address] [place], [place] [address]   Protime-INR     Status: None   Collection Time: 01/16/18  5:09 PM  Result Value Ref Range   Prothrombin Time 15.1 11.4 - 15.2 seconds   INR 1.20     Comment: Performed at [facility], [address] [place], [place] [address]  APTT     Status: None   Collection Time: 01/16/18  5:09 PM   Result Value Ref Range   aPTT 31 24 - 36 seconds    Comment: Performed at [facility], [address] [place], [place] [address]  Troponin I     Status: Abnormal   Collection Time: 01/16/18  6:50 PM  Result Value Ref Range   Troponin I 0.58 (HH) <0.03 ng/mL    Comment: CRITICAL VALUE NOTED. VALUE IS CONSISTENT WITH PREVIOUSLY REPORTED/CALLED VALUE. JML Performed at [facility], [address] [place], [place] [address]   MRSA PCR Screening     Status: None   Collection Time: 01/16/18  7:49 PM  Result Value Ref Range   MRSA by PCR NEGATIVE NEGATIVE    Comment:        The GeneXpert MRSA Assay (FDA approved for NASAL specimens only), is one component of a comprehensive MRSA colonization surveillance program. It is not intended to diagnose MRSA infection nor to guide or monitor treatment for MRSA infections. Performed at [facility], [address] [place], [place] [address]   Glucose, capillary     Status: Abnormal   Collection Time: 01/16/18  9:24 PM  Result Value Ref Range   Glucose-Capillary 231 (H) 70 - 99 mg/dL   Comment 1 Notify RN    Comment 2 Document in Chart   Troponin I     Status: Abnormal   Collection Time: 01/17/18 12:55 AM  Result Value Ref Range   Troponin I 0.46 (HH) <0.03 ng/mL    Comment: CRITICAL VALUE NOTED. VALUE IS CONSISTENT WITH PREVIOUSLY REPORTED/CALLED VALUE / FLC Performed at [facility], [address] [place], [place] [address]   Heparin level (unfractionated)     Status: Abnormal   Collection Time: 01/17/18 12:55 AM  Result Value Ref Range   Heparin Unfractionated 0.26 (L) 0.30 - 0.70 IU/mL    Comment: (NOTE) If heparin results  are below expected values, and patient dosage has  been confirmed, suggest follow up testing of antithrombin III levels. Performed at [facility], [address] [place], [place] [address]   APTT     Status: None   Collection Time: 01/17/18 12:55 AM   Result Value Ref Range   aPTT 34 24 - 36 seconds    Comment: Performed at [facility], [address] [place], [place] [address]  CBC     Status: Abnormal   Collection Time: 01/17/18 12:55 AM  Result Value Ref Range   WBC 7.5 3.8 - 10.6 K/uL   RBC 4.36 (L) 4.40 - 5.90 MIL/uL   Hemoglobin 12.5 (L) 13.0 - 18.0 g/dL   HCT 36.9 (L) 40.0 - 52.0 %   MCV 84.7 80.0 - 100.0 fL   MCH 28.7 26.0 - 34.0 pg   MCHC 33.9 32.0 - 36.0 g/dL   RDW 14.1 11.5 - 14.5 %   Platelets 211 150 - 440 K/uL    Comment: Performed at [facility], [address] [place], [place] [address]  Basic metabolic panel     Status: Abnormal   Collection Time: 01/17/18 12:55 AM  Result Value Ref Range   Sodium 138 135 - 145 mmol/L   Potassium 3.5 3.5 - 5.1 mmol/L   Chloride 106 98 - 111 mmol/L   CO2 25 22 - 32 mmol/L   Glucose, Bld 188 (H) 70 - 99 mg/dL   BUN 22 8 - 23 mg/dL   Creatinine, Ser 0.91 0.61 - 1.24 mg/dL   Calcium 8.4 (L) 8.9 - 10.3 mg/dL   GFR calc non Af Amer >60 >60 mL/min   GFR calc Af Amer >60 >60 mL/min    Comment: (NOTE) The eGFR has been calculated using the CKD EPI equation. This calculation has not been validated in all clinical situations. eGFR's persistently <60 mL/min signify possible Chronic Kidney Disease.    Anion gap 7 5 - 15    Comment: Performed at [facility], [place], [place] [address]  Heparin level (unfractionated)     Status: None   Collection Time: 01/17/18 10:00 AM  Result Value Ref Range   Heparin Unfractionated 0.62 0.30 - 0.70 IU/mL    Comment: (NOTE) If heparin results are below expected values, and patient dosage has  been confirmed, suggest follow up testing of antithrombin III levels. Performed at [facility], [address] [place], [place] [address]   APTT     Status: Abnormal   Collection Time: 01/17/18 10:00 AM  Result Value Ref Range   aPTT 60 (H) 24 - 36 seconds    Comment:        IF BASELINE  aPTT IS ELEVATED, SUGGEST PATIENT RISK ASSESSMENT BE USED TO DETERMINE APPROPRIATE ANTICOAGULANT THERAPY. Performed at [facility], [address] [place], [place] [address]   Glucose, capillary     Status: Abnormal   Collection Time: 01/17/18  4:52 PM  Result Value Ref Range   Glucose-Capillary 139 (H) 70 - 99 mg/dL  Heparin level (unfractionated)     Status: None   Collection Time: 01/17/18  7:21 PM  Result Value Ref Range   Heparin Unfractionated 0.44 0.30 - 0.70 IU/mL    Comment: (NOTE) If heparin results are below expected values, and patient dosage has  been confirmed, suggest follow up testing of antithrombin III levels. Performed at [facility], [address] [place], [place] [address]  APTT     Status: Abnormal   Collection Time: 01/17/18  7:21 PM  Result Value Ref Range   aPTT 58 (H) 24 - 36 seconds    Comment:        IF BASELINE aPTT IS ELEVATED, SUGGEST PATIENT RISK ASSESSMENT BE USED TO DETERMINE APPROPRIATE ANTICOAGULANT THERAPY. Performed at [facility], [address] [place], [place] [address]   Glucose, capillary     Status: Abnormal   Collection Time: 01/17/18  9:01 PM  Result Value Ref Range   Glucose-Capillary 123 (H) 70 - 99 mg/dL  CBC     Status: Abnormal   Collection Time: 01/18/18  4:17 AM  Result Value Ref Range   WBC 7.5 3.8 - 10.6 K/uL   RBC 4.10 (L) 4.40 - 5.90 MIL/uL   Hemoglobin 11.9 (L) 13.0 - 18.0 g/dL   HCT 34.9 (L) 40.0 - 52.0 %   MCV 85.1 80.0 - 100.0 fL   MCH 29.0 26.0 - 34.0 pg   MCHC 34.0 32.0 - 36.0 g/dL   RDW 14.6 (H) 11.5 - 14.5 %   Platelets 194 150 - 440 K/uL    Comment: Performed at [facility], [address] [place], [place] [address]  Basic metabolic panel     Status: Abnormal   Collection Time: 01/18/18  4:17 AM  Result Value Ref Range   Sodium 140 135 - 145 mmol/L   Potassium 4.2 3.5 - 5.1 mmol/L   Chloride 104 98 - 111 mmol/L   CO2 28 22 - 32 mmol/L   Glucose,  Bld 134 (H) 70 - 99 mg/dL   BUN 28 (H) 8 - 23 mg/dL   Creatinine, Ser 1.13 0.61 - 1.24 mg/dL   Calcium 8.3 (L) 8.9 - 10.3 mg/dL   GFR calc non Af Amer 59 (L) >60 mL/min   GFR calc Af Amer >60 >60 mL/min    Comment: (NOTE) The eGFR has been calculated using the CKD EPI equation. This calculation has not been validated in all clinical situations. eGFR's persistently <60 mL/min signify possible Chronic Kidney Disease.    Anion gap 8 5 - 15    Comment: Performed at [facility], [place], [place] [address]  Heparin level (unfractionated)     Status: Abnormal   Collection Time: 01/18/18  4:17 AM  Result Value Ref Range   Heparin Unfractionated 0.80 (H) 0.30 - 0.70 IU/mL    Comment: (NOTE) If heparin results are below expected values, and patient dosage has  been confirmed, suggest follow up testing of antithrombin III levels. Performed at [facility], [address] [place], [place] [address]   APTT     Status: Abnormal   Collection Time: 01/18/18  4:17 AM  Result Value Ref Range   aPTT 117 (H) 24 - 36 seconds    Comment:        IF BASELINE aPTT IS ELEVATED, SUGGEST PATIENT RISK ASSESSMENT BE USED TO DETERMINE APPROPRIATE ANTICOAGULANT THERAPY. Performed at [facility], [address] [place], [place] [address]   Glucose, capillary     Status: None   Collection Time: 01/18/18  7:46 AM  Result Value Ref Range   Glucose-Capillary 99 70 - 99 mg/dL  Glucose, capillary     Status: Abnormal   Collection Time: 01/18/18 12:00 PM  Result Value Ref Range   Glucose-Capillary 251 (H) 70 - 99 mg/dL    Radiology Dg Chest 2 View  Result Date: 01/16/2018  CLINICAL DATA:  Chest pain and nausea for several hours EXAM: CHEST - 2 VIEW COMPARISON:  11/23/2017 FINDINGS: Cardiac shadow is at the upper limits of normal in size. Aortic calcifications are noted. Mild interstitial changes are seen consistent with a degree of interstitial edema. No  sizable effusion is seen. No focal infiltrate is noted. IMPRESSION: Mild interstitial edema.  No acute infiltrate is noted. Electronically Signed   By: Inez Catalina M.D.   On: 01/16/2018 11:05    Assessment/Plan Hyperlipidemia lipid control important in reducing the progression of atherosclerotic disease. Continue statin therapy   Type 2 diabetes mellitus treated with insulin (HCC) blood glucose control important in reducing the progression of atherosclerotic disease. Also, involved in wound healing. On appropriate medications.   Essential hypertension blood pressure control important in reducing the progression of atherosclerotic disease. On appropriate oral medications.  A-fib (HCC) On anticoagulation. Rapid A. fib lead to a prolonged hospitalization lastyear.  Atherosclerosis of native arteries of the extremities with ulceration ([place]) Although his ABIs today are technically normal, the waveforms are not particularly good and this is likely a false elevation from medial calcification.  His waveforms are monophasic to biphasic at best.  His digital pressure is only 40.  This would be consistent with significant arterial insufficiency and is likely the primary reason his wound has not healed.  I have again discussed with the family the serious and limb threatening nature of the situation.  I think an angiogram with possible revascularization would be in his best interest, but he is a very high risk patient and due to his inability to lay still, he requires anesthesia for the procedure.  They want to talk with his wound care specialist in a couple of weeks which is reasonable and they will call our office to schedule the angiogram if they decide to have it.  Otherwise, I will see him back in a few months.   Leotis Pain, MD  01/27/2018 11:00 AM    This note was created with Dragon medical transcription system.  Any errors from dictation are purely unintentional

## 2018-01-27 NOTE — Patient Instructions (Signed)
Endovascular Therapy for Peripheral Arterial Disease Peripheral arterial disease (PAD) is a condition in which the arteries that supply blood to the arms and legs (limbs) are too narrow. The narrowing is due to plaque buildup (atherosclerosis). Plaque is made up of fat, cholesterol, calcium, or other substances that can build up inside blood vessels. PAD can cause pain and numbness due to lack of blood flow, particularly in the legs. Endovascular therapy is a procedure to widen a narrowed blood vessel and improve blood flow. Endovascular means the procedure is done inside your artery, using a long, thin tube (catheter). The catheter is inserted into an incision in your leg and moved up your artery until it reaches the narrow part. A balloon or a small metal tube (stent) may be used to help widen the narrow artery and keep it open. Your health care provider may recommend endovascular therapy if lifestyle changes and medicines are not enough to improve your PAD. In some cases-such as when more than one artery is affected-you may need more than one procedure. Tell a health care provider about:  Any allergies you have.  All medicines you are taking, including vitamins, herbs, eye drops, creams, and over-the-counter medicines.  Any problems you or family members have had with anesthetic medicines.  Any blood disorders you have.  Any surgeries you have had.  Any medical conditions you have.  Whether you are pregnant or may be pregnant. What are the risks? Generally, this is a safe procedure. However, problems may occur, including:  Infection.  Bleeding.  Allergic reactions to medicines, materials, or dyes.  Damage to other structures or organs.  Heart attack.  Stroke.  Blood clots.  Kidney problems.  Nerve damage.  The stent moving out of place, becoming blocked, or not working.  Loss of your affected arm or leg.  What happens before the procedure? Medicines  Ask your health  care provider about: ? Changing or stopping your regular medicines. This is especially important if you are taking diabetes medicines or blood thinners. ? Taking medicines such as aspirin and ibuprofen. These medicines can thin your blood. Do not take these medicines unless your health care provider tells you to take them. ? Taking over-the-counter medicines, vitamins, herbs, and supplements.  You may be given antibiotic medicine to help prevent infection. General instructions  Do not use any products that contain nicotine or tobacco, such as cigarettes and e-cigarettes. If you need help quitting, ask your health care provider.  Follow instructions from your health care provider about eating or drinking restrictions.  You will have blood tests and a physical exam. You may have other tests, such as: ? Ankle-brachial index (ABI). This test compares blood pressure in your ankle and arm. This can indicate narrowing or blockage in your leg arteries. ? Doppler ultrasound. This test uses sound waves to check blood flow. ? CT scan. This test uses dye to check blood flow and blockages in your leg arteries. ? MRI. ? Electrocardiogram (ECG) to check the electrical patterns and rhythms of the heart.  You may be asked to shower with a germ-killing soap.  Ask your health care provider how your surgical site will be marked or identified.  Plan to have someone take you home from the hospital. What happens during the procedure?  To lower your risk of infection: ? Your health care team will wash or sanitize their hands. ? Hair may be removed from the surgical area. ? Your skin will be washed with soap.    An IV will be inserted into one of your veins.  You will be given one or more of the following: ? A medicine to help you relax (sedative). ? A medicine to numb the area for the procedure (local anesthetic).  A puncture will be made in your upper thigh area, in the femoral artery or the iliac  artery. Rarely, a puncture may be made in the ankle area. A small incision may be made instead of a puncture.  A small wire will be inserted into the artery and moved through the artery.  Dye will be injected into your artery, and X-rays will be used to help identify where the blockage is. The dye helps to make the blood flow visible on X-rays.  A catheter will be inserted into the same spot as the small wire. It will be moved up the artery to reach the blocked or narrow part.  A small, deflated balloon will be inserted over the wire and into the catheter. It will be moved up the artery to reach the blocked or narrow part.  The small balloon will be filled with air (inflated) to widen the narrow part of the artery.  The balloon will be deflated.  A stent may be placed in the widened part of the artery to keep the artery open.  The wire and catheter will be removed.  A small catheter (urinary catheter) may be placed to drain urine from your bladder. This catheter may be used temporarily to drain urine during or after the procedure.  Your puncture or incision may be closed with a stitch (suture) or skin glue.  Your puncture or incision may be covered with a bandage (dressing). The procedure may vary among health care providers and hospitals. What happens after the procedure?  Your blood pressure, heart rate, breathing rate, and blood oxygen level will be monitored until the medicines you were given have worn off.  You will need to stay in bed as directed.  You may continue to have a catheter draining your urine.  You will be encouraged to drink fluids to wash (flush) the dye out of your body.  You will be given pain medicine as needed.  If you were given a sedative, do not drive for 24 hours or until your health care provider says that driving is safe for you. Summary  Endovascular therapy is a procedure to widen a narrowed blood vessel and improve blood flow.  This procedure  may be recommended if lifestyle changes and medicines are not enough to improve your peripheral arterial disease (PAD).  After the procedure, you will need to stay in bed and you will be encouraged to drink fluids to wash (flush) dye out of your body. This information is not intended to replace advice given to you by your health care provider. Make sure you discuss any questions you have with your health care provider. Document Released: 09/02/2016 Document Revised: 09/02/2016 Document Reviewed: 09/02/2016 [employer] Interactive Patient Education  2018 [employer]  

## 2018-02-01 ENCOUNTER — Encounter: Payer: Medicare Other | Attending: Internal Medicine | Admitting: Internal Medicine

## 2018-02-01 DIAGNOSIS — L97212 Non-pressure chronic ulcer of right calf with fat layer exposed: Secondary | ICD-10-CM | POA: Diagnosis present

## 2018-02-01 DIAGNOSIS — E11622 Type 2 diabetes mellitus with other skin ulcer: Secondary | ICD-10-CM | POA: Diagnosis not present

## 2018-02-01 DIAGNOSIS — I252 Old myocardial infarction: Secondary | ICD-10-CM | POA: Insufficient documentation

## 2018-02-01 DIAGNOSIS — Z7901 Long term (current) use of anticoagulants: Secondary | ICD-10-CM | POA: Insufficient documentation

## 2018-02-01 DIAGNOSIS — E1151 Type 2 diabetes mellitus with diabetic peripheral angiopathy without gangrene: Secondary | ICD-10-CM | POA: Diagnosis not present

## 2018-02-01 DIAGNOSIS — I110 Hypertensive heart disease with heart failure: Secondary | ICD-10-CM | POA: Diagnosis not present

## 2018-02-01 DIAGNOSIS — I5032 Chronic diastolic (congestive) heart failure: Secondary | ICD-10-CM | POA: Diagnosis not present

## 2018-02-01 DIAGNOSIS — G40909 Epilepsy, unspecified, not intractable, without status epilepticus: Secondary | ICD-10-CM | POA: Insufficient documentation

## 2018-02-01 DIAGNOSIS — Z79899 Other long term (current) drug therapy: Secondary | ICD-10-CM | POA: Diagnosis not present

## 2018-02-01 DIAGNOSIS — I2510 Atherosclerotic heart disease of native coronary artery without angina pectoris: Secondary | ICD-10-CM | POA: Diagnosis not present

## 2018-02-01 DIAGNOSIS — I4891 Unspecified atrial fibrillation: Secondary | ICD-10-CM | POA: Diagnosis not present

## 2018-02-01 DIAGNOSIS — Z89512 Acquired absence of left leg below knee: Secondary | ICD-10-CM | POA: Insufficient documentation

## 2018-02-03 NOTE — Progress Notes (Signed)
JADAKISS, BARISH ([id number]) Visit Report for 02/01/2018 Chief Complaint Document Details Patient Name: MOHAB, ASHBY A. Date of Service: 02/01/2018 1:00 PM Medical Record Number: [id number] Patient Account Number: [id number] Date of Birth/Sex: [birth year] (82 y.o. M) Treating RN: Montey Hora Primary Care Provider: SYSTEM, PCP Other Clinician: Referring Provider: Raechel Ache, DAVID Treating Provider/Extender: Tito Dine in Treatment: 0 Information Obtained from: Patient Chief Complaint Non-healing left foot surgical ulcer 02/01/18; patient is here for review of a wound on his right anterior tibial area. Electronic Signature(s) Signed: 02/01/2018 5:30:50 PM By: Linton Ham MD Entered By: Linton Ham on 02/01/2018 13:41:47 [name] ([id number]) -------------------------------------------------------------------------------- Debridement Details Patient Name: [name] Date of Service: 02/01/2018 1:00 PM Medical Record Number: [id number] Patient Account Number: [id number] Date of Birth/Sex: [birth year] (82 y.o. M) Treating RN: Montey Hora Primary Care Provider: SYSTEM, PCP Other Clinician: Referring Provider: Raechel Ache, DAVID Treating Provider/Extender: Tito Dine in Treatment: 0 Debridement Performed for Wound #2 Right,Medial Lower Leg Assessment: Performed By: Physician Ricard Dillon, MD Debridement Type: Debridement Severity of Tissue Pre Fat layer exposed Debridement: Pre-procedure Verification/Time Yes - 13:24 Out Taken: Start Time: 13:24 Pain Control: Lidocaine 4% Topical Solution Total Area Debrided (L x W): 2.2 (cm) x 1.8 (cm) = 3.96 (cm) Tissue and other material Viable, Non-Viable, Slough, Subcutaneous, Slough debrided: Level: Skin/Subcutaneous Tissue Debridement Description: Excisional Instrument: Curette Bleeding: Minimum Hemostasis Achieved: Pressure End Time: 13:26 Procedural Pain: 0 Post Procedural  Pain: 0 Response to Treatment: Procedure was tolerated well Level of Consciousness: Awake and Alert Post Debridement Measurements of Total Wound Length: (cm) 2.2 Width: (cm) 1.8 Depth: (cm) 0.3 Volume: (cm) 0.933 Character of Wound/Ulcer Post Debridement: Improved Severity of Tissue Post Debridement: Fat layer exposed Post Procedure Diagnosis Same as Pre-procedure Electronic Signature(s) Signed: 02/01/2018 5:20:22 PM By: Montey Hora Signed: 02/01/2018 5:30:50 PM By: Linton Ham MD Entered By: Linton Ham on 02/01/2018 13:41:18 Wollenberg, [name] ([id number]) -------------------------------------------------------------------------------- HPI Details Patient Name: [name] Date of Service: 02/01/2018 1:00 PM Medical Record Number: [id number] Patient Account Number: [id number] Date of Birth/Sex: September 17, 1934 (82 y.o. M) Treating RN: Montey Hora Primary Care Provider: SYSTEM, PCP Other Clinician: Referring Provider: Raechel Ache, DAVID Treating Provider/Extender: Tito Dine in Treatment: 0 History of Present Illness Location: Left foot surgical site in the distal/lateral region Quality: Patient describes the pain a sore/burning sensation Severity: 3/10 at rest and 8/10 with manipulation Duration: Patientos surgery was on 10/15/16 with Dr. Albertine Patricia DPM Context: Patient had an amputation of the left 3rd - 5th toes on 10/15/16 and the surgical site has not healed following Modifying Factors: Currently this has been packed with saline soaked gauze in a wet to dry format Associated Signs and Symptoms: HTN, DM, A-fib, chronic Coumadin use HPI Description: 11/29/16 patient presents today for evaluation concerning a nonhealing surgical wound following amputation of his left third through fifth toes which was performed on 10/15/16. Unfortunately following this amputation the circle side has not closed appropriately and he continues to have a significant amount of  discomfort. He did have an x-ray performed on 10/21/16 which showed him being status post D3 toe amputation but otherwise no acute findings. Patient also has venous imaging of the left lower extremity which revealed no evidence of DVT on 10/22/16. His last hemoglobin A 1C was 8.5 and this was on 10/22/16 and prior to his amputation patient did have vascular surgery with Dr. Leotis Pain where he had a catheter placed in the left anterior tibialis/dorsalis  pedis artery from the right femoral approach. Percutaneous transluminal angioplasty of the left anterior tibial artery/dorsalis pedis artery was performed. This procedure apparently went very well. Unfortunately his healing just has not been what both Dr. Elvina Mattes nor himself were expecting or wanting. The question posed to [place] today is whether hyperbaric therapy would be of benefit for him. READMISSION 02/01/18 This is now a 83 year old man we had in the clinic over 2 years ago. At that point he was here for evaluation of amputations of several of his toes on his left foot with a nonhealing surgical wound. He was only here for 1 visit. The patient tells me he went on to have a left below-knee amputation slightly over a year ago. Sometime earlier this year perhaps in April he traumatized his right shin and he has had an open wound here since then. He is already seen Dr. Lucky Cowboy and vein and vascular. He had noninvasive tests that showed an ABI on the right of 1.23 but a TBI in the right of only 0.27. He had monophasic waveforms at the anterior tibial and biphasic at the posterior tibial. The overall interpretation was that although his resting right ankle brachial index is within normal limits this may be falsely elevated. Furthermore his toe brachial index was quite a bit reduced. According to the last note from vein and vascular on 01/27/18 they discussed angiogram with possible revascularization on the right. He was felt to be reasonably high risk because of  his and inability inability to stay still requiring general anesthesia therefore. They told him to think about it and get back to them. He was referred here for review of this wound. As I understand things they've been using Xeroform I'm not clear what they've been putting on this although they did talk about "an ointment". 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 During his admission to hospital in July MRSA was apparently cultured from this wound and he received a course of Doxy. As he has had recent noninvasive arterial studies done on 01/27/18,no ABIs were felt to be necessary here Electronic Signature(s) Signed: 02/01/2018 5:30:50 PM By: Linton Ham MD Entered By: Linton Ham on 02/01/2018 13:47:55 Marcom, [name] ([id number]) -------------------------------------------------------------------------------- Physical Exam Details Patient Name: [name] Date of Service: 02/01/2018 1:00 PM Medical Record Number: [id number] Patient Account Number: [id number] Date of Birth/Sex: 1934-10-08 (82 y.o. M) Treating RN: Montey Hora Primary Care Provider: SYSTEM, PCP Other Clinician: Referring Provider: Raechel Ache, DAVID Treating Provider/Extender: Ricard Dillon Weeks in Treatment: 0 Constitutional Sitting or standing Blood Pressure is within target range for patient.Marland Kitchen Heart rate irregular.Marland Kitchen Respirations regular, non-labored and within target range.. Temperature is normal and within the target range for the patient.Marland Kitchen appears in no distress. Respiratory Respiratory effort is easy and symmetric bilaterally. Rate is normal at rest and on room air.. Bilateral breath sounds are clear and equal in all lobes with no wheezes, rales or rhonchi.. Cardiovascular atrial fibrillation.  popliteal pulse was palpable on the right as was the femoral. very faint to absent pedal pulses. Musculoskeletal previous left BKA with a prosthesis in place. Integumentary (Hair, Skin) patient had a dry erythematous crusty rash on the right anterior tibia in the periwound area. This had the appearance of either chronic venous insufficiency or some form of contact  dermatitis. No other systemic skin rash was seen. Psychiatric No evidence of depression, anxiety, or agitation. Calm, cooperative, and communicative. Appropriate interactions and affect.. Notes wound exam; areas on the right anterior tibial area. Covered in adherent necrotic debris which I removed with a #5 curet. Hemostasis was with direct pressure although there was noticeable bleeding. Around the wound is a dry scaly erythematous rash which may be chronic venous insufficiency although it didn't exactly have the from what they have been putting over the wound area. I saw no evidence of infection here. No cultures were done noted. No empiric antibiotics Electronic Signature(s) Signed: 02/01/2018 5:30:50 PM By: Linton Ham MD Entered By: Linton Ham on 02/01/2018 13:50:48 [name] ([id number]) -------------------------------------------------------------------------------- Physician Orders Details Patient Name: [name] Date of Service: 02/01/2018 1:00 PM Medical Record Number: [id number] Patient Account Number: [id number] Date of Birth/Sex: [birth year] (82 y.o. M) Treating RN: Montey Hora Primary Care Provider: SYSTEM, PCP Other Clinician: Referring Provider: Raechel Ache, DAVID Treating Provider/Extender: Tito Dine in Treatment: 0 Verbal / Phone Orders: No Diagnosis Coding ICD-10 Coding Code Description [phone] Non-pressure chronic ulcer of right calf with fat layer exposed E11.622 Type 2 diabetes mellitus with other skin ulcer E11.51 Type 2 diabetes mellitus with diabetic peripheral  angiopathy without gangrene Wound Cleansing Wound #2 Right,Medial Lower Leg o May Shower, gently pat wound dry prior to applying new dressing. Anesthetic (add to Medication List) Wound #2 Right,Medial Lower Leg o Topical Lidocaine 4% cream applied to wound bed prior to debridement (In Clinic Only). Skin Barriers/Peri-Wound Care Wound #2 Right,Medial Lower Leg o Triamcinolone Acetonide Ointment (TCA) Primary Wound Dressing Wound #2 Right,Medial Lower Leg o Hydrogel - or KY Jelly to moisten silver collagen o Silver Collagen - moistened with hydrogel or KY jelly Secondary Dressing Wound #2 Right,Medial Lower Leg o ABD and Kerlix/Conform Dressing Change Frequency Wound #2 Right,Medial Lower Leg o Change Dressing Monday, Wednesday, Friday Follow-up Appointments Wound #2 Right,Medial Lower Leg o Return Appointment in 1 week. Edema Control Wound #2 Right,Medial Lower Leg o Elevate legs to the level of the heart and pump ankles as often as possible Patient Medications Beman, Harout A. ([id number]) Allergies: Iodinated Contrast- Oral and IV Dye, latex Notifications Medication Indication Start End triamcinolone acetonide 01/31/2018 DOSE topical 0.1 % cream - cream topical to peri wound with dressing changes Electronic Signature(s) Signed: 02/01/2018 1:55:06 PM By: Linton Ham MD Entered By: Linton Ham on 02/01/2018 13:55:05 Maciejewski, [name] ([id number]) -------------------------------------------------------------------------------- Problem List Details Patient Name: [name] Date of Service: 02/01/2018 1:00 PM Medical Record Number: [id number] Patient Account Number: [id number] Date of Birth/Sex: [birth year] (82 y.o. M) Treating RN: Montey Hora Primary Care Provider: SYSTEM, PCP Other Clinician: Referring Provider: Raechel Ache, DAVID Treating Provider/Extender: Tito Dine in Treatment: 0 Active Problems ICD-10 Evaluated  Encounter Code Description Active Date Today Diagnosis L97.212 Non-pressure chronic ulcer of right calf with fat layer exposed 02/01/2018 No Yes E11.622 Type 2 diabetes mellitus with other skin ulcer 02/01/2018 No Yes E11.51 Type 2 diabetes mellitus with diabetic peripheral angiopathy 02/01/2018 No Yes without gangrene Inactive Problems Resolved Problems Electronic Signature(s) Signed: 02/01/2018 5:30:50 PM By: Linton Ham MD Entered By: Linton Ham on 02/01/2018 13:03:44 Irwin. ([id number]) -------------------------------------------------------------------------------- Progress Note Details Patient Name: [name] Date of Service: 02/01/2018 1:00 PM Medical Record Number: [id number] Patient Account Number: [id number] Date of Birth/Sex: 18-May-1935 (82 y.o. M) Treating RN: Montey Hora Primary Care Provider: SYSTEM, PCP Other Clinician: Referring Provider: Raechel Ache, DAVID Treating  Provider/Extender: Ricard Dillon Weeks in Treatment: 0 Subjective Chief Complaint Information obtained from Patient Non-healing left foot surgical ulcer 02/01/18; patient is here for review of a wound on his right anterior tibial area. History of Present Illness (HPI) The following HPI elements were documented for the patient's wound: Location: Left foot surgical site in the distal/lateral region Quality: Patient describes the pain a sore/burning sensation Severity: 3/10 at rest and 8/10 with manipulation Duration: Patient s surgery was on 10/15/16 with Dr. Albertine Patricia DPM Context: Patient had an amputation of the left 3rd - 5th toes on 10/15/16 and the surgical site has not healed following Modifying Factors: Currently this has been packed with saline soaked gauze in a wet to dry format Associated Signs and Symptoms: HTN, DM, A-fib, chronic Coumadin use 11/29/16 patient presents today for evaluation concerning a nonhealing surgical wound following amputation of his left  third through fifth toes which was performed on 10/15/16. Unfortunately following this amputation the circle side has not closed appropriately and he continues to have a significant amount of discomfort. He did have an x-ray performed on 10/21/16 which showed him being status post D3 toe amputation but otherwise no acute findings. Patient also has venous imaging of the left lower extremity which revealed no evidence of DVT on 10/22/16. His last hemoglobin A 1C was 8.5 and this was on 10/22/16 and prior to his amputation patient did have vascular surgery with Dr. Leotis Pain where he had a catheter placed in the left anterior tibialis/dorsalis pedis artery from the right femoral approach. Percutaneous transluminal angioplasty of the left anterior tibial artery/dorsalis pedis artery was performed. This procedure apparently went very well. Unfortunately his healing just has not been what both Dr. Elvina Mattes nor himself were expecting or wanting. The question posed to [place] today is whether hyperbaric therapy would be of benefit for him. READMISSION 02/01/18 This is now a 83 year old man we had in the clinic over 2 years ago. At that point he was here for evaluation of amputations of several of his toes on his left foot with a nonhealing surgical wound. He was only here for 1 visit. The patient tells me he went on to have a left below-knee amputation slightly over a year ago. Sometime earlier this year perhaps in April he traumatized his right shin and he has had an open wound here since then. He is already seen Dr. Lucky Cowboy and vein and vascular. He had noninvasive tests that showed an ABI on the right of 1.23 but a TBI in the right of only 0.27. He had monophasic waveforms at the anterior tibial and biphasic at the posterior tibial. The overall interpretation was that although his resting right ankle brachial index is within normal limits this may be falsely elevated. Furthermore his toe brachial index was quite a  bit reduced. According to the last note from vein and vascular on 01/27/18 they discussed angiogram with possible revascularization on the right. He was felt to be reasonably high risk because of his and inability inability to stay still requiring general anesthesia therefore. They told him to think about it and get back to them. He was referred here for review of this wound. As I understand things they've been using Xeroform I'm not clear what they've been putting on this although they did talk about "an ointment". I wonder whether this was Annitta Needs The patient has had a history of coronary artery disease with a non-STEMI MI in August, diastolic heart failure, atrial fib, type 2  diabetes with known PAD, left BKA one year ago, hypertension and seizure disorder. During his admission to hospital in July MRSA was apparently cultured from this wound and he received a course of Doxy. As he has had recent noninvasive arterial studies done on 01/27/18,no ABIs were felt to be necessary here Plair, Morley A. ([id number]) Wound History Patient presents with 1 open wound that has been present for approximately 3 months. Patient has been treating wound in the following manner: xeroform gauze. Laboratory tests have not been performed in the last month. Patient reportedly has not tested positive for an antibiotic resistant organism. Patient reportedly has not tested positive for osteomyelitis. Patient reportedly has not had testing performed to evaluate circulation in the legs. Patient History Information obtained from Patient. Allergies Iodinated Contrast- Oral and IV Dye, latex Family History Cancer - Siblings, Hypertension - Siblings, Stroke - Siblings, No family history of Diabetes, Heart Disease, Kidney Disease, Lung Disease, Seizures, Thyroid Problems, Tuberculosis. Social History Former smoker, Marital Status - Married, Alcohol Use - Never, Drug Use - No History, Caffeine Use - Daily. Medical  History Endocrine Patient has history of Type II Diabetes Denies history of Type I Diabetes Patient is treated with Insulin, Oral Agents. Blood sugar is not tested. Blood sugar results noted at the following times: Breakfast - 168. Medical And Surgical History Notes Constitutional Symptoms (General Health) HTN; Diabetes Type II, A-Fib; Neuropathy Review of Systems (ROS) Ear/Nose/Mouth/Throat The patient has no complaints or symptoms. Hematologic/Lymphatic The patient has no complaints or symptoms. Respiratory Denies complaints or symptoms of Chronic or frequent coughs, Shortness of Breath. Cardiovascular Denies complaints or symptoms of Chest pain, LE edema. Endocrine Denies complaints or symptoms of Hepatitis, Thyroid disease, Polydypsia (Excessive Thirst). Objective Constitutional Sitting or standing Blood Pressure is within target range for patient.Marland Kitchen Heart rate irregular.Marland Kitchen Respirations regular, non-labored and within target range.. Temperature is normal and within the target range for the patient.Marland Kitchen appears in no distress. VALLEN, CALABRESE A. ([id number]) Vitals Time Taken: 12:48 PM, Height: 67 in, Source: Stated, Weight: 167 lbs, Source: Measured, BMI: 26.2, Temperature: 97.7 F, Pulse: 72 bpm, Respiratory Rate: 18 breaths/min, Blood Pressure: 123/74 mmHg. Respiratory Respiratory effort is easy and symmetric bilaterally. Rate is normal at rest and on room air.. Bilateral breath sounds are clear and equal in all lobes with no wheezes, rales or rhonchi.. Cardiovascular atrial fibrillation. popliteal pulse was palpable on the right as was the femoral. very faint to absent pedal pulses. Musculoskeletal previous left BKA with a prosthesis in place. Psychiatric No evidence of depression, anxiety, or agitation. Calm, cooperative, and communicative. Appropriate interactions and affect.. General Notes: wound exam; areas on the right anterior tibial area. Covered in adherent necrotic  debris which I removed with a #5 curet. Hemostasis was with direct pressure although there was noticeable bleeding. Around the wound is a dry scaly erythematous rash which may be chronic venous insufficiency although it didn't exactly have the from what they have been putting over the wound area. I saw no evidence of infection here. No cultures were done noted. No empiric antibiotics Integumentary (Hair, Skin) patient had a dry erythematous crusty rash on the right anterior tibia in the periwound area. This had the appearance of either chronic venous insufficiency or some form of contact dermatitis. No other systemic skin rash was seen. Wound #2 status is Open. Original cause of wound was Gradually Appeared. The wound is located on the Right,Medial Lower Leg. The wound measures 2.2cm length x 1.8cm width x 0.2cm depth;  3.11cm^2 area and 0.622cm^3 volume. There is Fat Layer (Subcutaneous Tissue) Exposed exposed. There is no tunneling or undermining noted. There is a medium amount of serosanguineous drainage noted. The wound margin is distinct with the outline attached to the wound base. There is small (1-33%) red, pink granulation within the wound bed. There is a large (67-100%) amount of necrotic tissue within the wound bed including Eschar and Adherent Slough. The periwound skin appearance exhibited: Erythema. The periwound skin appearance did not exhibit: Callus, Crepitus, Excoriation, Induration, Rash, Scarring, Dry/Scaly, Maceration, Atrophie Blanche, Cyanosis, Ecchymosis, Hemosiderin Staining, Mottled, Pallor, Rubor. The surrounding wound skin color is noted with erythema which is circumferential. Periwound temperature was noted as No Abnormality. The periwound has tenderness on palpation. Assessment Active Problems ICD-10 Non-pressure chronic ulcer of right calf with fat layer exposed Type 2 diabetes mellitus with other skin ulcer Type 2 diabetes mellitus with diabetic peripheral  angiopathy without gangrene Procedures Wound #2 Yellen, Zayyan A. ([id number]) Pre-procedure diagnosis of Wound #2 is a Diabetic Wound/Ulcer of the Lower Extremity located on the Right,Medial Lower Leg .Severity of Tissue Pre Debridement is: Fat layer exposed. There was a Excisional Skin/Subcutaneous Tissue Debridement with a total area of 3.96 sq cm performed by Ricard Dillon, MD. With the following instrument(s): Curette to remove Viable and Non-Viable tissue/material. Material removed includes Subcutaneous Tissue and Slough and after achieving pain control using Lidocaine 4% Topical Solution. No specimens were taken. A time out was conducted at 13:24, prior to the start of the procedure. A Minimum amount of bleeding was controlled with Pressure. The procedure was tolerated well with a pain level of 0 throughout and a pain level of 0 following the procedure. Patient s Level of Consciousness post procedure was recorded as Awake and Alert. Post Debridement Measurements: 2.2cm length x 1.8cm width x 0.3cm depth; 0.933cm^3 volume. Character of Wound/Ulcer Post Debridement is improved. Severity of Tissue Post Debridement is: Fat layer exposed. Post procedure Diagnosis Wound #2: Same as Pre-Procedure Plan Wound Cleansing: Wound #2 Right,Medial Lower Leg: May Shower, gently pat wound dry prior to applying new dressing. Anesthetic (add to Medication List): Wound #2 Right,Medial Lower Leg: Topical Lidocaine 4% cream applied to wound bed prior to debridement (In Clinic Only). Skin Barriers/Peri-Wound Care: Wound #2 Right,Medial Lower Leg: Triamcinolone Acetonide Ointment (TCA) Primary Wound Dressing: Wound #2 Right,Medial Lower Leg: Hydrogel - or KY Jelly to moisten silver collagen Silver Collagen - moistened with hydrogel or KY jelly Secondary Dressing: Wound #2 Right,Medial Lower Leg: ABD and Kerlix/Conform Dressing Change Frequency: Wound #2 Right,Medial Lower Leg: Change Dressing  Monday, Wednesday, Friday Follow-up Appointments: Wound #2 Right,Medial Lower Leg: Return Appointment in 1 week. Edema Control: Wound #2 Right,Medial Lower Leg: Elevate legs to the level of the heart and pump ankles as often as possible The following medication(s) was prescribed: triamcinolone acetonide topical 0.1 % cream cream topical to peri wound with dressing changes starting 01/31/2018 #1 I'm going to put triamcinolone to the periwound #2 silver collagen to the wound after debridement. If the debris reoccurs may need Santyl or Iodoflex. #3ABD/Curlex Covan #4 were going to have home health change the dressing 2 times before we see him again. HOLSTON, OYAMA A. ([id number]) #5 the patient has known PAD related to his diabetes. He was offered an angiogram but apparently could not make a decision. I'll talk to him about this next week and if he agrees send him back earlier than a few months to see Dr. Lucky Cowboy. He doesn't agree then  we will continue to treat him and I think [facility] has an appointment with him in 2-3 months #6 currently I didn't think anything was infected here. I noted that the patient previously grew MRSA out of this wound apparently in July although I have not research this. I didn't think any cultures were necessary today. Electronic Signature(s) Signed: 02/01/2018 1:55:30 PM By: Linton Ham MD Entered By: Linton Ham on 02/01/2018 13:55:30 [name] ([id number]) -------------------------------------------------------------------------------- ROS/PFSH Details Patient Name: [name] Date of Service: 02/01/2018 1:00 PM Medical Record Number: [id number] Patient Account Number: [id number] Date of Birth/Sex: 1934-06-10 (82 y.o. M) Treating RN: Roger Shelter Primary Care Provider: SYSTEM, PCP Other Clinician: Referring Provider: Raechel Ache, DAVID Treating Provider/Extender: Tito Dine in Treatment: 0 Information Obtained  From Patient Wound History Do you currently have one or more open woundso Yes How many open wounds do you currently haveo 1 Approximately how long have you had your woundso 3 months How have you been treating your wound(s) until nowo xeroform gauze Has your wound(s) ever healed and then re-openedo No Have you had any lab work done in the past montho No Have you tested positive for an antibiotic resistant organism (MRSA, VRE)o No Have you tested positive for osteomyelitis (bone infection)o No Have you had any tests for circulation on your legso No Respiratory Complaints and Symptoms: Negative for: Chronic or frequent coughs; Shortness of Breath Medical History: Positive for: Sleep Apnea - c-pap Negative for: Aspiration; Asthma; Chronic Obstructive Pulmonary Disease (COPD); Pneumothorax; Tuberculosis Cardiovascular Complaints and Symptoms: Negative for: Chest pain; LE edema Medical History: Positive for: Arrhythmia; Congestive Heart Failure; Hypertension; Peripheral Arterial Disease Negative for: Angina; Coronary Artery Disease; Deep Vein Thrombosis; Hypotension; Myocardial Infarction; Peripheral Venous Disease; Phlebitis; Vasculitis Endocrine Complaints and Symptoms: Negative for: Hepatitis; Thyroid disease; Polydypsia (Excessive Thirst) Medical History: Positive for: Type II Diabetes Negative for: Type I Diabetes Time with diabetes: 30 years Treated with: Insulin, Oral agents Blood sugar tested every day: No Blood sugar testing results: Breakfast: 168 Constitutional Symptoms (General Health) MAMOUDOU, MULVEHILL A. ([id number]) Medical History: Past Medical History Notes: HTN; Diabetes Type II, A-Fib; Neuropathy Eyes Medical History: Positive for: Cataracts - removed Negative for: Glaucoma; Optic Neuritis Ear/Nose/Mouth/Throat Complaints and Symptoms: No Complaints or Symptoms Medical History: Negative for: Chronic sinus problems/congestion; Middle ear  problems Hematologic/Lymphatic Complaints and Symptoms: No Complaints or Symptoms Medical History: Negative for: Anemia; Hemophilia; Human Immunodeficiency Virus; Lymphedema; Sickle Cell Disease Gastrointestinal Medical History: Negative for: Cirrhosis ; Colitis; Crohnos; Hepatitis A; Hepatitis B; Hepatitis C Genitourinary Medical History: Negative for: End Stage Renal Disease Immunological Medical History: Negative for: Lupus Erythematosus; Raynaudos; Scleroderma Integumentary (Skin) Medical History: Negative for: History of Burn; History of pressure wounds Musculoskeletal Medical History: Positive for: Osteoarthritis Negative for: Gout; Rheumatoid Arthritis; Osteomyelitis Neurologic Medical History: Negative for: Dementia; Neuropathy; Quadriplegia; Paraplegia; Seizure Disorder Oncologic MARLOWE, LAWES ([id number]) Medical History: Negative for: Received Chemotherapy; Received Radiation Psychiatric Medical History: Negative for: Anorexia/bulimia; Confinement Anxiety HBO Extended History Items Eyes: Cataracts Immunizations Pneumococcal Vaccine: Received Pneumococcal Vaccination: Yes Implantable Devices Family and Social History Cancer: Yes - Siblings; Diabetes: No; Heart Disease: No; Hypertension: Yes - Siblings; Kidney Disease: No; Lung Disease: No; Seizures: No; Stroke: Yes - Siblings; Thyroid Problems: No; Tuberculosis: No; Former smoker; Marital Status - Married; Alcohol Use: Never; Drug Use: No History; Caffeine Use: Daily; Advanced Directives: No; Patient does not want information on Advanced Directives; Do not resuscitate: No; Living Will: No; Medical Power of Attorney: No Electronic Signature(s)  Signed: 02/01/2018 5:03:38 PM By: Roger Shelter Signed: 02/01/2018 5:30:50 PM By: Linton Ham MD Entered By: Roger Shelter on 02/01/2018 12:56:41

## 2018-02-03 NOTE — Progress Notes (Signed)
[name], [name] ([id number]) Visit Report for 02/01/2018 Allergy List Details Patient Name: [name], [name] Date of Service: 02/01/2018 1:00 PM Medical Record Number: [id number] Patient Account Number: [id number] Date of Birth/Sex: 1934-07-07 (83 y.o. M) Treating RN: Roger Shelter Primary Care Candice Tobey: SYSTEM, PCP Other Clinician: Referring Cynthya Yam: Raechel Ache, DAVID Treating Janiylah Hannis/Extender: Ricard Dillon Weeks in Treatment: 0 Allergies Active Allergies Iodinated Contrast- Oral and IV Dye latex Allergy Notes Electronic Signature(s) Signed: 02/01/2018 5:03:38 PM By: Roger Shelter Entered By: Roger Shelter on 02/01/2018 12:49:48 Meine, [name] ([id number]) -------------------------------------------------------------------------------- Arrival Information Details Patient Name: Jesse Floor A. Date of Service: 02/01/2018 1:00 PM Medical Record Number: [id number] Patient Account Number: [id number] Date of Birth/Sex: 1934-08-22 (83 y.o. 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 Date of Service: 02/01/2018 1:00 PM Medical Record Number: [id number] Patient Account Number: [id number] Date of Birth/Sex: [birth year]  (83 y.o. M) Treating RN: Montey Hora Primary Care Montae Stager: SYSTEM, PCP Other Clinician: Referring Menelik Mcfarren: Raechel Ache, DAVID Treating Jane Birkel/Extender: Tito Dine in Treatment: 0 Clinic Level of Care Assessment Items TOOL 1 Quantity Score []  - Use when EandM and Procedure is performed on INITIAL visit 0 ASSESSMENTS - Nursing Assessment / Reassessment X - General Physical Exam (combine w/ comprehensive assessment (listed just below) when 1 20 performed on new pt. evals) X- 1 25 Comprehensive Assessment (HX, ROS, Risk Assessments, Wounds Hx, etc.) ASSESSMENTS - Wound and Skin Assessment / Reassessment []  - Dermatologic / Skin Assessment (not related to wound area) 0 ASSESSMENTS - Ostomy and/or Continence Assessment and Care []  - Incontinence Assessment and Management 0 []  - 0 Ostomy Care Assessment and Management (repouching, etc.) PROCESS - Coordination of Care X - Simple Patient / Family Education for ongoing care 1 15 []  - 0 Complex (extensive) Patient / Family Education for ongoing care X- 1 10 Staff obtains Programmer, systems, Records, Test Results / Process Orders []  - 0 Staff telephones HHA, Nursing Homes / Clarify orders / etc []  - 0 Routine Transfer to another Facility (non-emergent condition) []  - 0 Routine Hospital Admission (non-emergent condition) X- 1 15 New Admissions / Biomedical engineer / Ordering NPWT, Apligraf, etc. []  - 0 Emergency Hospital Admission (emergent condition) PROCESS - Special Needs []  - Pediatric / Minor Patient Management 0 []  - 0 Isolation Patient Management []  - 0 Hearing / Language / Visual special needs []  - 0 Assessment of Community assistance (transportation, D/C planning, etc.) []  - 0 Additional assistance / Altered mentation []  - 0 Support Surface(s) Assessment (bed, cushion, seat, etc.) [name], [name] ([id number]) INTERVENTIONS - Miscellaneous []  - External ear exam 0 []  - 0 Patient Transfer (multiple staff /  Civil Service fast streamer / Similar devices) []  - 0 Simple Staple / Suture removal (25 or less) []  - 0 Complex Staple / Suture removal (26 or more) []  - 0 Hypo/Hyperglycemic Management (do not check if billed separately) []  - 0 Ankle / Brachial Index (ABI) - do not check if billed separately Has the  patient been seen at the hospital within the last three years: Yes Total Score: 85 Level Of Care: New/Established - Level 3 Electronic Signature(s) Signed: 02/01/2018 5:20:22 PM By: Montey Hora Entered By: Montey Hora on 02/01/2018 13:27:40 [name] ([id number]) -------------------------------------------------------------------------------- Encounter Discharge Information Details Patient Name: Jesse Floor A. Date of Service: 02/01/2018 1:00 PM Medical Record Number: [id number] Patient Account Number: [id number] Date of Birth/Sex: February 07, 1935 (83 y.o. M) Treating RN: Roger Shelter Primary Care Mauria Asquith: SYSTEM, PCP Other Clinician: Referring Machi Whittaker: Raechel Ache, DAVID Treating Harbert Fitterer/Extender: Tito Dine in Treatment: 0 Encounter Discharge Information Items Discharge Condition: Stable Ambulatory Status: Cane Discharge Destination: Home Transportation: Private Auto Schedule Follow-up Appointment: Yes Clinical Summary of Care: Electronic Signature(s) Signed: 02/01/2018 5:03:38 PM By: Roger Shelter Entered By: Roger Shelter on 02/01/2018 13:39:21 [name], [name] ([id number]) -------------------------------------------------------------------------------- Lower Extremity Assessment Details Patient Name: Jesse Floor A. Date of Service: 02/01/2018 1:00 PM Medical Record Number: [id number] Patient Account Number: [id number] Date of Birth/Sex: 1934/11/11 (83 y.o. M) Treating RN: Roger Shelter Primary Care Ira Busbin: SYSTEM, PCP Other Clinician: Referring Dona Klemann: Raechel Ache, DAVID Treating Aylan Bayona/Extender: Tito Dine in Treatment: 0 Edema  Assessment Assessed: [Left: No] [Right: No] Edema: [Left: Ye] [Right: s] Calf Left: Right: Point of Measurement: 32 cm From Medial Instep cm 33.5 cm Ankle Left: Right: Point of Measurement: 13 cm From Medial Instep cm 22 cm Vascular Assessment Claudication: Claudication Assessment [Right:None] Pulses: Dorsalis Pedis Palpable: [Right:Yes] Posterior Tibial Extremity colors, hair growth, and conditions: Extremity Color: [Right:Hyperpigmented] Hair Growth on Extremity: [Right:No] Temperature of Extremity: [Right:Warm] Capillary Refill: [Right:< 3 seconds] Toe Nail Assessment Left: Right: Thick: Yes Discolored: Yes Deformed: No Improper Length and Hygiene: No Electronic Signature(s) Signed: 02/01/2018 5:03:38 PM By: Roger Shelter Entered By: Roger Shelter on 02/01/2018 13:03:34 [name], [name] ([id number]) -------------------------------------------------------------------------------- Multi Wound Chart Details Patient Name: Jesse Floor A. Date of Service: 02/01/2018 1:00 PM Medical Record Number: [id number] Patient Account Number: [id number] Date of Birth/Sex: [birth year] (83 y.o. M) Treating RN: Montey Hora Primary Care Jovita Persing: SYSTEM, PCP Other Clinician: Referring Devona Holmes: Raechel Ache, DAVID Treating Saamiya Jeppsen/Extender: Tito Dine in Treatment: 0 Vital Signs Height(in): 34 Pulse(bpm): 72 Weight(lbs): 167 Blood Pressure(mmHg): 123/74 Body Mass Index(BMI): 26 Temperature(F): 97.7 Respiratory Rate 18 (breaths/min): Photos: [2:No Photos] [N/A:N/A] Wound Location: [2:Right Lower Leg - Medial] [N/A:N/A] Wounding Event: [2:Gradually Appeared] [N/A:N/A] Primary Etiology: [2:Diabetic Wound/Ulcer of the Lower Extremity] [N/A:N/A] Comorbid History: [2:Cataracts, Sleep Apnea, Arrhythmia, Congestive Heart Failure, Hypertension, Peripheral Arterial Disease, Type II Diabetes, Osteoarthritis] [N/A:N/A] Date Acquired: [2:10/22/2017] [N/A:N/A] Weeks of  Treatment: [2:0] [N/A:N/A] Wound Status: [2:Open] [N/A:N/A] Measurements L x W x D [2:2.2x1.8x0.2] [N/A:N/A] (cm) Area (cm) : [2:3.11] [N/A:N/A] Volume (cm) : [2:0.622] [N/A:N/A] Classification: [2:Grade 2] [N/A:N/A] Exudate Amount: [2:Medium] [N/A:N/A] Exudate Type: [2:Serosanguineous] [N/A:N/A] Exudate Color: [2:red, brown] [N/A:N/A] Wound Margin: [2:Distinct, outline attached] [N/A:N/A] Granulation Amount: [2:Small (1-33%)] [N/A:N/A] Granulation Quality: [2:Red, Pink] [N/A:N/A] Necrotic Amount: [2:Large (67-100%)] [N/A:N/A] Necrotic Tissue: [2:Eschar, Adherent Slough] [N/A:N/A] Exposed Structures: [2:Fat Layer (Subcutaneous Tissue) Exposed: Yes Fascia: No Tendon: No Muscle: No Joint: No Bone: No] [N/A:N/A] Epithelialization: [2:None] [N/A:N/A] Debridement: [2:Debridement - Excisional 13:24] [N/A:N/A N/A] Pre-procedure Verification/Time Out Taken: Pain Control: Lidocaine 4% Topical Solution N/A N/A Tissue Debrided: Subcutaneous, Slough N/A N/A Level: Skin/Subcutaneous Tissue N/A N/A Debridement Area (sq cm): 3.96 N/A N/A Instrument: Curette N/A N/A Bleeding: Minimum N/A N/A Hemostasis Achieved: Pressure N/A N/A Procedural Pain: 0 N/A N/A Post Procedural Pain: 0 N/A N/A Debridement Treatment Procedure was tolerated well N/A N/A Response: Post Debridement  2.2x1.8x0.3 N/A N/A Measurements L x W x D (cm) Post Debridement Volume: 0.933 N/A N/A (cm) Periwound Skin Texture: Excoriation: No N/A N/A Induration: No Callus: No Crepitus: No Rash: No Scarring: No Periwound Skin Moisture: Maceration: No N/A N/A Dry/Scaly: No Periwound Skin Color: Erythema: Yes N/A N/A Atrophie Blanche: No Cyanosis: No Ecchymosis: No Hemosiderin Staining: No Mottled: No Pallor: No Rubor: No Erythema Location: Circumferential N/A N/A Temperature: No Abnormality N/A N/A Tenderness on Palpation: Yes N/A N/A Wound Preparation: Ulcer Cleansing: Wound N/A N/A Cleanser Topical  Anesthetic Applied: Other: lidocaine 4% Procedures Performed: Debridement N/A N/A Treatment Notes Wound #2 (Right, Medial Lower Leg) 1. Cleansed with: Clean wound with Normal Saline 2. Anesthetic Topical Lidocaine 4% cream to wound bed prior to debridement 4. Dressing Applied: Hydrogel Prisma Ag 5. Secondary Dressing Applied ABD Pad Kerlix/Conform [name], [name] ([id number]) Notes stretch net Electronic Signature(s) Signed: 02/01/2018 5:30:50 PM By: Linton Ham MD Entered By: Linton Ham on 02/01/2018 13:41:04 [name] ([id number]) -------------------------------------------------------------------------------- Multi-Disciplinary Care Plan Details Patient Name: Jesse Floor A. Date of Service: 02/01/2018 1:00 PM Medical Record Number: [id number] Patient Account Number: [id number] Date of Birth/Sex: 07-Feb-1935 (83 y.o. M) Treating RN: Montey Hora Primary Care Loyalty Brashier: SYSTEM, PCP Other Clinician: Referring Filicia Scogin: Raechel Ache, DAVID Treating Satori Krabill/Extender: Tito Dine in Treatment: 0 Active Inactive ` Abuse / Safety / Falls / Self Care Management Nursing Diagnoses: Impaired physical mobility Goals: Patient will remain injury free related to falls Date Initiated: 02/01/2018 Target Resolution Date: 04/28/2018 Goal Status: Active Interventions: Assess fall risk on admission and as needed Notes: ` Orientation to the Wound Care Program Nursing Diagnoses: Knowledge deficit related to the wound healing center program Goals: Patient/caregiver will verbalize understanding of the [facility] Date Initiated: 02/01/2018 Target Resolution Date: 04/29/2018 Goal Status: Active Interventions: Provide education on orientation to the wound center Notes: ` Wound/Skin Impairment Nursing Diagnoses: Impaired tissue integrity Goals: Ulcer/skin breakdown will heal within 14 weeks Date Initiated: 02/01/2018 Target Resolution Date:  04/29/2018 Goal Status: Active Interventions: [name], [name] ([id number]) Assess patient/caregiver ability to obtain necessary supplies Assess patient/caregiver ability to perform ulcer/skin care regimen upon admission and as needed Assess ulceration(s) every visit Notes: Electronic Signature(s) Signed: 02/01/2018 5:20:22 PM By: Montey Hora Entered By: Montey Hora on 02/01/2018 13:21:04 [name] ([id number]) -------------------------------------------------------------------------------- Pain Assessment Details Patient Name: Jesse Floor A. Date of Service: 02/01/2018 1:00 PM Medical Record Number: [id number] Patient Account Number: [id number] Date of Birth/Sex: May 01, 1935 (83 y.o. M) Treating RN: Roger Shelter Primary Care Harding Thomure: SYSTEM, PCP Other Clinician: Referring Maidie Streight: Raechel Ache, DAVID Treating Finn Amos/Extender: Tito Dine in Treatment: 0 Active Problems Location of Pain Severity and Description of Pain Patient Has Paino No Site Locations Pain Management and Medication Current Pain Management: Electronic Signature(s) Signed: 02/01/2018 5:03:38 PM By: Roger Shelter Entered By: Roger Shelter on 02/01/2018 12:48:08 [name] ([id number]) -------------------------------------------------------------------------------- Patient/Caregiver Education Details Patient Name: Jesse Floor A. Date of Service: 02/01/2018 1:00 PM Medical Record Number: [id number] Patient Account Number: [id number] Date of Birth/Gender: 1934-07-23 (83 y.o. M) Treating RN: Roger Shelter Primary Care Physician: SYSTEM, PCP Other Clinician: Referring Physician: Raechel Ache, DAVID Treating Physician/Extender: Tito Dine in Treatment: 0 Education Assessment Education Provided To: Patient Education Topics Provided Wound Debridement: Handouts: Wound Debridement Methods: Explain/Verbal Responses: State content correctly Wound/Skin  Impairment: Handouts: Caring for Your Ulcer Methods: Explain/Verbal Responses: State content correctly Electronic Signature(s) Signed: 02/01/2018 5:03:38 PM By: Roger Shelter Entered By: Roger Shelter on 02/01/2018 13:39:35 [name],  [name] ([id number]) -------------------------------------------------------------------------------- Wound Assessment Details Patient Name: [name], [name] Date of Service: 02/01/2018 1:00 PM Medical Record Number: [id number] Patient Account Number: [id number] Date of Birth/Sex: 10-May-1935 (83 y.o. M) Treating RN: Roger Shelter Primary Care Gray Doering: SYSTEM, PCP Other Clinician: Referring Rande Roylance: Raechel Ache, DAVID Treating Abhijay Morriss/Extender: Tito Dine in Treatment: 0 Wound Status Wound Number: 2 Primary Diabetic Wound/Ulcer of the Lower Extremity Etiology: Wound Location: Right Lower Leg - Medial Wound Open Wounding Event: Gradually Appeared Status: Date Acquired: 10/22/2017 Comorbid Cataracts, Sleep Apnea, Arrhythmia, Congestive Weeks Of Treatment: 0 History: Heart Failure, Hypertension, Peripheral Arterial Clustered Wound: No Disease, Type II Diabetes, Osteoarthritis Wound Measurements Length: (cm) 2.2 Width: (cm) 1.8 Depth: (cm) 0.2 Area: (cm) 3.11 Volume: (cm) 0.622 % Reduction in Area: % Reduction in Volume: Epithelialization: None Tunneling: No Undermining: No Wound Description Classification: Grade 2 Wound Margin: Distinct, outline attached Exudate Amount: Medium Exudate Type: Serosanguineous Exudate Color: red, brown Foul Odor After Cleansing: No Slough/Fibrino Yes Wound Bed Granulation Amount: Small (1-33%) Exposed Structure Granulation Quality: Red, Pink Fascia Exposed: No Necrotic Amount: Large (67-100%) Fat Layer (Subcutaneous Tissue) Exposed: Yes Necrotic Quality: Eschar, Adherent Slough Tendon Exposed: No Muscle Exposed: No Joint Exposed: No Bone Exposed: No Periwound Skin Texture Texture  Color No Abnormalities Noted: No No Abnormalities Noted: No Callus: No Atrophie Blanche: No Crepitus: No Cyanosis: No Excoriation: No Ecchymosis: No Induration: No Erythema: Yes Rash: No Erythema Location: Circumferential Scarring: No Hemosiderin Staining: No Mottled: No Moisture Pallor: No No Abnormalities Noted: No Rubor: No Dry / Scaly: No Maceration: No Temperature / Pain [name], [name] ([id number]) Temperature: No Abnormality Tenderness on Palpation: Yes Wound Preparation Ulcer Cleansing: Wound Cleanser Topical Anesthetic Applied: Other: lidocaine 4%, Treatment Notes Wound #2 (Right, Medial Lower Leg) 1. Cleansed with: Clean wound with Normal Saline 2. Anesthetic Topical Lidocaine 4% cream to wound bed prior to debridement 4. Dressing Applied: Hydrogel Prisma Ag 5. Secondary Dressing Applied ABD Pad Kerlix/Conform Notes stretch net Electronic Signature(s) Signed: 02/01/2018 5:03:38 PM By: Roger Shelter Entered By: Roger Shelter on 02/01/2018 13:01:50 [name] ([id number]) -------------------------------------------------------------------------------- Vitals Details Patient Name: Jesse Floor A. Date of Service: 02/01/2018 1:00 PM Medical Record Number: [id number] Patient Account Number: [id number] Date of Birth/Sex: 1934-11-20 (83 y.o. M) Treating RN: Roger Shelter Primary Care Shantele Reller: SYSTEM, PCP Other Clinician: Referring Cassiopeia Florentino: Raechel Ache, DAVID Treating Luman Holway/Extender: Tito Dine in Treatment: 0 Vital Signs Time Taken: 12:48 Temperature (F): 97.7 Height (in): 67 Pulse (bpm): 72 Source: Stated Respiratory Rate (breaths/min): 18 Weight (lbs): 167 Blood Pressure (mmHg): 123/74 Source: Measured Reference Range: 80 - 120 mg / dl Body Mass Index (BMI): 26.2 Electronic Signature(s) Signed: 02/01/2018 5:03:38 PM By: Roger Shelter Entered By: Roger Shelter on 02/01/2018 12:49:29

## 2018-02-03 NOTE — Progress Notes (Signed)
TYRIN, HERBERS ([id number]) Visit Report for 02/01/2018 Abuse/Suicide Risk Screen Details Patient Name: [name], [name] Date of Service: 02/01/2018 1:00 PM Medical Record Number: [id number] Patient Account Number: [id number] Date of Birth/Sex: 1934-05-27 (83 y.o. M) Treating RN: Roger Shelter Primary Care Clydell Alberts: SYSTEM, PCP Other Clinician: Referring Chukwudi Ewen: Raechel Ache, DAVID Treating Joselyne Spake/Extender: Tito Dine in Treatment: 0 Abuse/Suicide Risk Screen Items Answer ABUSE/SUICIDE RISK SCREEN: Has anyone close to you tried to hurt or harm you recentlyo No Do you feel uncomfortable with anyone in your familyo No Has anyone forced you do things that you didnot want to doo No Do you have any thoughts of harming yourselfo No Patient displays signs or symptoms of abuse and/or neglect. No Electronic Signature(s) Signed: 02/01/2018 5:03:38 PM By: Roger Shelter Entered By: Roger Shelter on 02/01/2018 12:56:48 [name] ([id number]) -------------------------------------------------------------------------------- Activities of Daily Living Details Patient Name: [name], [name] Date of Service: 02/01/2018 1:00 PM Medical Record Number: [id number] Patient Account Number: [id number] Date of Birth/Sex: 1934/10/14 (83 y.o. M) Treating RN: Roger Shelter Primary Care Dillinger Aston: SYSTEM, PCP Other Clinician: Referring Kyiah Canepa: Raechel Ache, DAVID Treating Elira Colasanti/Extender: Tito Dine in Treatment: 0 Activities of Daily Living Items Answer Activities of Daily Living (Please select one for each item) Drive Automobile Completely Able Take Medications Completely Able Use Telephone Completely Able Care for Appearance Completely Able Use Toilet Completely Able Bath / Shower Completely Able Dress Self Completely Able Feed Self Completely Able Walk Completely Able Get In / Out Bed Completely Able Housework Completely Able Prepare Meals Completely  Able Handle Money Completely Able Shop for Self Completely Able Electronic Signature(s) Signed: 02/01/2018 5:03:38 PM By: Roger Shelter Entered By: Roger Shelter on 02/01/2018 12:57:12 [name] ([id number]) -------------------------------------------------------------------------------- Education Assessment Details Patient Name: Jesse Floor A. Date of Service: 02/01/2018 1:00 PM Medical Record Number: [id number] Patient Account Number: [id number] Date of Birth/Sex: [birth year] (83 y.o. M) Treating RN: Roger Shelter Primary Care Eather Chaires: SYSTEM, PCP Other Clinician: Referring Jeptha Hinnenkamp: Raechel Ache, DAVID Treating Marquita Lias/Extender: Tito Dine in Treatment: 0 Primary Learner Assessed: Patient Learning Preferences/Education Level/Primary Language Learning Preference: Explanation Highest Education Level: College or Above Preferred Language: English Cognitive Barrier Assessment/Beliefs Language Barrier: No Translator Needed: No Memory Deficit: No Emotional Barrier: No Physical Barrier Assessment Impaired Vision: No Impaired Hearing: No Decreased Hand dexterity: No Knowledge/Comprehension Assessment Knowledge Level: High Comprehension Level: High Ability to understand written High instructions: Ability to understand verbal High instructions: Motivation Assessment Anxiety Level: Calm Cooperation: Cooperative Education Importance: Acknowledges Need Interest in Health Problems: Asks Questions Perception: Coherent Willingness to Engage in Self- High Management Activities: Readiness to Engage in Self- High Management Activities: Electronic Signature(s) Signed: 02/01/2018 5:03:38 PM By: Roger Shelter Entered By: Roger Shelter on 02/01/2018 12:57:31 [name] ([id number]) -------------------------------------------------------------------------------- Fall Risk Assessment Details Patient Name: Jesse Floor A. Date of Service:  02/01/2018 1:00 PM Medical Record Number: [id number] Patient Account Number: [id number] Date of Birth/Sex: [birth year] (83 y.o. M) Treating RN: Roger Shelter Primary Care Cheila Wickstrom: SYSTEM, PCP Other Clinician: Referring Skyrah Krupp: Raechel Ache, DAVID Treating Makelle Marrone/Extender: Tito Dine in Treatment: 0 Fall Risk Assessment Items Have you had 2 or more falls in the last 12 monthso 0 No Have you had any fall that resulted in injury in the last 12 monthso 0 No FALL RISK ASSESSMENT: History of falling - immediate or within 3 months 0 No Secondary diagnosis 0 No Ambulatory aid None/bed rest/wheelchair/nurse 0 No Crutches/cane/walker 0 No Furniture 0 No IV Access/Saline Lock 0 No  Gait/Training Normal/bed rest/immobile 0 No Weak 0 No Impaired 0 No Mental Status Oriented to own ability 0 No Electronic Signature(s) Signed: 02/01/2018 5:03:38 PM By: Roger Shelter Entered By: Roger Shelter on 02/01/2018 12:57:39 [name], [name] ([id number]) -------------------------------------------------------------------------------- Foot Assessment Details Patient Name: Jesse Floor A. Date of Service: 02/01/2018 1:00 PM Medical Record Number: [id number] Patient Account Number: [id number] Date of Birth/Sex: December 25, 1934 (83 y.o. M) Treating RN: Roger Shelter Primary Care Malin Cervini: SYSTEM, PCP Other Clinician: Referring Findley Blankenbaker: Raechel Ache, DAVID Treating Kammy Klett/Extender: Tito Dine in Treatment: 0 Foot Assessment Items Site Locations + = Sensation present, - = Sensation absent, C = Callus, U = Ulcer R = Redness, W = Warmth, M = Maceration, PU = Pre-ulcerative lesion F = Fissure, S = Swelling, D = Dryness Assessment Right: Left: Other Deformity: No No Prior Foot Ulcer: No No Prior Amputation: No No Charcot Joint: No No Ambulatory Status: Ambulatory With Help Assistance Device: Cane Gait: Steady Electronic Signature(s) Signed: 02/01/2018 5:03:38 PM By: Roger Shelter Entered By: Roger Shelter on 02/01/2018 12:58:46 [name], [name] ([id number]) -------------------------------------------------------------------------------- Nutrition Risk Assessment Details Patient Name: Jesse Floor A. Date of Service: 02/01/2018 1:00 PM Medical Record Number: [id number] Patient Account Number: [id number] Date of Birth/Sex: February 08, 1935 (83 y.o. M) Treating RN: Roger Shelter Primary Care Wrenly Lauritsen: SYSTEM, PCP Other Clinician: Referring Liliane Mallis: Raechel Ache, DAVID Treating Francia Verry/Extender: Tito Dine in Treatment: 0 Height (in): 67 Weight (lbs): 167 Body Mass Index (BMI): 26.2 Nutrition Risk Assessment Items NUTRITION RISK SCREEN: I have an illness or condition that made me change the kind and/or amount of 0 No food I eat I eat fewer than two meals per day 0 No I eat few fruits and vegetables, or milk products 0 No I have three or more drinks of beer, liquor or wine almost every day 0 No I have tooth or mouth problems that make it hard for me to eat 0 No I don't always have enough money to buy the food I need 0 No I eat alone most of the time 0 No I take three or more different prescribed or over-the-counter drugs a day 0 No Without wanting to, I have lost or gained 10 pounds in the last six months 0 No I am not always physically able to shop, cook and/or feed myself 0 No Nutrition Protocols Good Risk Protocol 0 No interventions needed Moderate Risk Protocol Electronic Signature(s) Signed: 02/01/2018 5:03:38 PM By: Roger Shelter Entered By: Roger Shelter on 02/01/2018 12:57:47

## 2018-02-08 ENCOUNTER — Encounter: Payer: Medicare Other | Admitting: Internal Medicine

## 2018-02-08 DIAGNOSIS — E11622 Type 2 diabetes mellitus with other skin ulcer: Secondary | ICD-10-CM | POA: Diagnosis not present

## 2018-02-09 NOTE — Addendum Note (Signed)
Addended by: Pura Spice on: 02/09/2018 07:49 AM   Modules accepted: Orders

## 2018-02-11 NOTE — Progress Notes (Signed)
ELAN, BRAINERD ([id number]) Visit Report for 02/08/2018 Debridement Details Patient Name: [name], [name]. Date of Service: 02/08/2018 12:30 PM Medical Record Number: [id number] Patient Account Number: [id number] Date of Birth/Sex: March 25, 1935 (83 y.o. M) Treating RN: Cornell Barman Primary Care Provider: SYSTEM, PCP Other Clinician: Referring Provider: Raechel Ache, DAVID Treating Provider/Extender: Tito Dine in Treatment: 1 Debridement Performed for Wound #2 Right,Medial Lower Leg Assessment: Performed By: Physician Ricard Dillon, MD Debridement Type: Debridement Severity of Tissue Pre Fat layer exposed Debridement: Level of Consciousness (Pre- Awake and Alert procedure): Pre-procedure Verification/Time Yes - 13:00 Out Taken: Start Time: 13:01 Pain Control: Other : lidocaine 4% Total Area Debrided (L x W): 1.8 (cm) x 1.7 (cm) = 3.06 (cm) Tissue and other material Viable, Non-Viable, Slough, Subcutaneous, Slough debrided: Level: Skin/Subcutaneous Tissue Debridement Description: Excisional Instrument: Curette Bleeding: Minimum Hemostasis Achieved: Pressure End Time: 13:04 Procedural Pain: 0 Post Procedural Pain: 0 Response to Treatment: Procedure was tolerated well Level of Consciousness Awake and Alert (Post-procedure): Post Debridement Measurements of Total Wound Length: (cm) 1.8 Width: (cm) 1.7 Depth: (cm) 0.3 Volume: (cm) 0.721 Character of Wound/Ulcer Post Debridement: Stable Severity of Tissue Post Debridement: Fat layer exposed Post Procedure Diagnosis Same as Pre-procedure Electronic Signature(s) Signed: 02/08/2018 4:51:54 PM By: Linton Ham MD Signed: 02/09/2018 5:07:26 PM By: Gretta Cool, BSN, RN, CWS, Kim RN, BSN Entered By: Linton Ham on 02/08/2018 13:12:44 [name] ([id number]) Prevette, Hodari [name]. ([id number]) -------------------------------------------------------------------------------- HPI Details Patient Name:  [name]. Date of Service: 02/08/2018 12:30 PM Medical Record Number: [id number] Patient Account Number: [id number] Date of Birth/Sex: 27-Sep-1934 (83 y.o. M) Treating RN: Cornell Barman Primary Care Provider: SYSTEM, PCP Other Clinician: Referring Provider: Raechel Ache, DAVID Treating Provider/Extender: Tito Dine in Treatment: 1 History of Present Illness Location: Left foot surgical site in the distal/lateral region Quality: Patient describes the pain [name] sore/burning sensation Severity: 3/10 at rest and 8/10 with manipulation Duration: Patientos surgery was on 10/15/16 with Dr. Albertine Patricia DPM Context: Patient had an amputation of the left 3rd - 5th toes on 10/15/16 and the surgical site has not healed following Modifying Factors: Currently this has been packed with saline soaked gauze in [name] wet to dry format Associated Signs and Symptoms: HTN, DM, [name]-fib, chronic Coumadin use HPI Description: 11/29/16 patient presents today for evaluation concerning [name] nonhealing surgical wound following amputation of his left third through fifth toes which was performed on 10/15/16. Unfortunately following this amputation the circle side has not closed appropriately and he continues to have [name] significant amount of discomfort. He did have an x-ray performed on 10/21/16 which showed him being status post D3 toe amputation but otherwise no acute findings. Patient also has venous imaging of the left lower extremity which revealed no evidence of DVT on 10/22/16. His last hemoglobin [name] 1C was 8.5 and this was on 10/22/16 and prior to his amputation patient did have vascular surgery with Dr. Leotis Pain where he had [name] catheter placed in the left anterior tibialis/dorsalis pedis artery from the right femoral approach. Percutaneous transluminal angioplasty of the left anterior tibial artery/dorsalis pedis artery was performed. This procedure apparently went very well. Unfortunately his healing just has not been what  both Dr. Elvina Mattes nor himself were expecting or wanting. The question posed to [place] today is whether hyperbaric therapy would be of benefit for him. READMISSION 02/01/18 This is now [name] 83 year old man we had in the clinic over 2 years ago. At that point he was here for evaluation  of amputations of several of his toes on his left foot with [name] nonhealing surgical wound. He was only here for 1 visit. The patient tells me he went on to have [name] left below-knee amputation slightly over [name] year ago. Sometime earlier this year perhaps in April he traumatized his right shin and he has had an open wound here since then. He is already seen Dr. Lucky Cowboy and vein and vascular. He had noninvasive tests that showed an ABI on the right of 1.23 but [name] TBI in the right of only 0.27. He had monophasic waveforms at the anterior tibial and biphasic at the posterior tibial. The overall interpretation was that although his resting right ankle brachial index is within normal limits this may be falsely elevated. Furthermore his toe brachial index was quite [name] bit reduced. According to the last note from vein and vascular on 01/27/18 they discussed angiogram with possible revascularization on the right. He was felt to be reasonably high risk because of his and inability inability to stay still requiring general anesthesia therefore. They told him to think about it and get back to them. He was referred here for review of this wound. As I understand things they've been using Xeroform I'm not clear what they've been putting on this although they did talk about "an ointment". 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 During his admission to hospital in July MRSA was apparently cultured from this wound and he received [name] course of Doxy. As he  has had recent noninvasive arterial studies done on 01/27/18,no ABIs were felt to be necessary here. 02/08/18; patient arrives in with [name] better looking wound. Dimensions are smaller. using silver collagen Electronic Signature(s) Signed: 02/08/2018 4:51:54 PM By: Linton Ham MD Entered By: Linton Ham on 02/08/2018 13:14:33 [name] ([id number]) -------------------------------------------------------------------------------- Physical Exam Details Patient Name: [name]. Date of Service: 02/08/2018 12:30 PM Medical Record Number: [id number] Patient Account Number: [id number] Date of Birth/Sex: 1935/05/03 (83 y.o. M) Treating RN: Cornell Barman Primary Care Provider: SYSTEM, PCP Other Clinician: Referring Provider: Raechel Ache, DAVID Treating Provider/Extender: Ricard Dillon Weeks in Treatment: 1 Constitutional Sitting or standing Blood Pressure is within target range for patient.. Pulse regular and within target range for patient.Marland Kitchen Respirations regular, non-labored and within target range.. Temperature is normal and within the target range for the patient.Marland Kitchen appears in no distress. Notes wound exam; wound on the right anterior tibial area. . Surface debris removed with [name] #3 curet. Hemostasis with direct pressure. There are rims of epithelialization here. No infection is seen Electronic Signature(s) Signed: 02/08/2018 4:51:54 PM By: Linton Ham MD Entered By: Linton Ham on 02/08/2018 13:16:24 [name] ([id number]) -------------------------------------------------------------------------------- Physician Orders Details Patient Name: [name]. Date of Service: 02/08/2018 12:30 PM Medical Record Number: [id number] Patient Account Number: [id number] Date of Birth/Sex: July 18, 1934 (83 y.o. M) Treating RN: Cornell Barman Primary Care Provider: SYSTEM, PCP Other Clinician: Referring Provider: Raechel Ache, DAVID Treating Provider/Extender: Tito Dine  in Treatment: 1 Verbal / Phone Orders: No Diagnosis Coding Wound Cleansing Wound #2 Right,Medial Lower Leg o May Shower, gently pat wound dry prior to applying new dressing. Anesthetic (add to Medication List) Wound #2 Right,Medial Lower Leg o Topical Lidocaine 4% cream applied to wound bed prior to debridement (In Clinic Only). Skin  Barriers/Peri-Wound Care Wound #2 Right,Medial Lower Leg o Triamcinolone Acetonide Ointment (TCA) Primary Wound Dressing Wound #2 Right,Medial Lower Leg o Hydrogel - or KY Jelly to moisten silver collagen o Silver Collagen - moistened with hydrogel or KY jelly Secondary Dressing Wound #2 Right,Medial Lower Leg o ABD and Kerlix/Conform Dressing Change Frequency Wound #2 Right,Medial Lower Leg o Change Dressing Monday, Wednesday, Friday Follow-up Appointments Wound #2 Right,Medial Lower Leg o Return Appointment in 1 week. Edema Control Wound #2 Right,Medial Lower Leg o Elevate legs to the level of the heart and pump ankles as often as possible Electronic Signature(s) Signed: 02/08/2018 4:51:54 PM By: Linton Ham MD Signed: 02/09/2018 5:07:26 PM By: Gretta Cool, BSN, RN, CWS, Kim RN, BSN Entered By: Gretta Cool, BSN, RN, CWS, Kim on 02/08/2018 13:01:42 [name] ([id number]) -------------------------------------------------------------------------------- Problem List Details Patient Name: [name]. Date of Service: 02/08/2018 12:30 PM Medical Record Number: [id number] Patient Account Number: [id number] Date of Birth/Sex: 05-24-1935 (83 y.o. M) Treating RN: Cornell Barman Primary Care Provider: SYSTEM, PCP Other Clinician: Referring Provider: Raechel Ache, DAVID Treating Provider/Extender: Tito Dine in Treatment: 1 Active Problems ICD-10 Evaluated Encounter Code Description Active Date Today Diagnosis L97.212 Non-pressure chronic ulcer of right calf with fat layer exposed 02/01/2018 No Yes E11.622 Type 2 diabetes  mellitus with other skin ulcer 02/01/2018 No Yes E11.51 Type 2 diabetes mellitus with diabetic peripheral angiopathy 02/01/2018 No Yes without gangrene Inactive Problems Resolved Problems Electronic Signature(s) Signed: 02/08/2018 4:51:54 PM By: Linton Ham MD Entered By: Linton Ham on 02/08/2018 13:12:06 [name] ([id number]) -------------------------------------------------------------------------------- Progress Note Details Patient Name: [name]. Date of Service: 02/08/2018 12:30 PM Medical Record Number: [id number] Patient Account Number: [id number] Date of Birth/Sex: 1935-05-14 (83 y.o. M) Treating RN: Cornell Barman Primary Care Provider: SYSTEM, PCP Other Clinician: Referring Provider: Raechel Ache, DAVID Treating Provider/Extender: Tito Dine in Treatment: 1 Subjective History of Present Illness (HPI) The following HPI elements were documented for the patient's wound: Location: Left foot surgical site in the distal/lateral region Quality: Patient describes the pain [name] sore/burning sensation Severity: 3/10 at rest and 8/10 with manipulation Duration: Patient s surgery was on 10/15/16 with Dr. Albertine Patricia DPM Context: Patient had an amputation of the left 3rd - 5th toes on 10/15/16 and the surgical site has not healed following Modifying Factors: Currently this has been packed with saline soaked gauze in [name] wet to dry format Associated Signs and Symptoms: HTN, DM, [name]-fib, chronic Coumadin use 11/29/16 patient presents today for evaluation concerning [name] nonhealing surgical wound following amputation of his left third through fifth toes which was performed on 10/15/16. Unfortunately following this amputation the circle side has not closed appropriately and he continues to have [name] significant amount of discomfort. He did have an x-ray performed on 10/21/16 which showed him being status post D3 toe amputation but otherwise no acute findings. Patient also has  venous imaging of the left lower extremity which revealed no evidence of DVT on 10/22/16. His last hemoglobin [name] 1C was 8.5 and this was on 10/22/16 and prior to his amputation patient did have vascular surgery with Dr. Leotis Pain where he had [name] catheter placed in the left anterior tibialis/dorsalis pedis artery from the right femoral approach. Percutaneous transluminal angioplasty of the left anterior tibial artery/dorsalis pedis artery was performed. This procedure apparently went very well. Unfortunately his healing just has not been what both Dr. Elvina Mattes nor himself were expecting or wanting. The question posed to [place] today is whether hyperbaric  therapy would be of benefit for him. READMISSION 02/01/18 This is now [name] 83 year old man we had in the clinic over 2 years ago. At that point he was here for evaluation of amputations of several of his toes on his left foot with [name] nonhealing surgical wound. He was only here for 1 visit. The patient tells me he went on to have [name] left below-knee amputation slightly over [name] year ago. Sometime earlier this year perhaps in April he traumatized his right shin and he has had an open wound here since then. He is already seen Dr. Lucky Cowboy and vein and vascular. He had noninvasive tests that showed an ABI on the right of 1.23 but [name] TBI in the right of only 0.27. He had monophasic waveforms at the anterior tibial and biphasic at the posterior tibial. The overall interpretation was that although his resting right ankle brachial index is within normal limits this may be falsely elevated. Furthermore his toe brachial index was quite [name] bit reduced. According to the last note from vein and vascular on 01/27/18 they discussed angiogram with possible revascularization on the right. He was felt to be reasonably high risk because of his and inability inability to stay still requiring general anesthesia therefore. They told him to think about it and get back to them. He was referred here  for review of this wound. As I understand things they've been using Xeroform I'm not clear what they've been putting on this although they did talk about "an ointment". 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 During his admission to hospital in July MRSA was apparently cultured from this wound and he received [name] course of Doxy. As he has had recent noninvasive arterial studies done on 01/27/18,no ABIs were felt to be necessary here. 02/08/18; patient arrives in with [name] better looking wound. Dimensions are smaller. using silver collagen Caicedo, Acheron [name]. ([id number]) Objective Constitutional Sitting or standing Blood Pressure is within target range for patient.. Pulse regular and within target range for patient.Marland Kitchen Respirations regular, non-labored and within target range.. Temperature is normal and within the target range for the patient.Marland Kitchen appears in no distress. Vitals Time Taken: 12:30 PM, Height: 67 in, Weight: 167 lbs, BMI: 26.2, Temperature: 97.7 F, Pulse: 69 bpm, Respiratory Rate: 16 breaths/min, Blood Pressure: 122/74 mmHg. General Notes: wound exam; wound on the right anterior tibial area. . Surface debris removed with [name] #3 curet. Hemostasis with direct pressure. There are rims of epithelialization here. No infection is seen Integumentary (Hair, Skin) Wound #2 status is Open. Original cause of wound was Gradually Appeared. The wound is located on the Right,Medial Lower Leg. The wound measures 1.8cm length x 1.7cm width x 0.2cm depth; 2.403cm^2 area and 0.481cm^3 volume. There is Fat Layer (Subcutaneous Tissue) Exposed exposed. There is no tunneling or undermining noted. There is [name] medium amount of serous drainage noted. The wound margin is distinct with the outline attached to the wound  base. There is small (1-33%) red, pink granulation within the wound bed. There is [name] large (67-100%) amount of necrotic tissue within the wound bed including Eschar and Adherent Slough. The periwound skin appearance exhibited: Erythema. The periwound skin appearance did not exhibit: Callus, Crepitus, Excoriation, Induration, Rash, Scarring, Dry/Scaly, Maceration, Atrophie Blanche, Cyanosis, Ecchymosis, Hemosiderin Staining, Mottled, Pallor, Rubor. The  surrounding wound skin color is noted with erythema which is circumferential. Periwound temperature was noted as No Abnormality. The periwound has tenderness on palpation. Assessment Active Problems ICD-10 Non-pressure chronic ulcer of right calf with fat layer exposed Type 2 diabetes mellitus with other skin ulcer Type 2 diabetes mellitus with diabetic peripheral angiopathy without gangrene Procedures Wound #2 Pre-procedure diagnosis of Wound #2 is [name] Diabetic Wound/Ulcer of the Lower Extremity located on the Right,Medial Lower Leg .Severity of Tissue Pre Debridement is: Fat layer exposed. There was [name] Excisional Skin/Subcutaneous Tissue Debridement with [name] total area of 3.06 sq cm performed by Ricard Dillon, MD. With the following instrument(s): Curette to remove Viable and Non-Viable tissue/material. Material removed includes Subcutaneous Tissue and Slough and after achieving pain control using Other (lidocaine 4%). No specimens were taken. [name] time out was conducted at 13:00, prior to the start of the procedure. [name] Minimum amount of bleeding was controlled with Pressure. The procedure was tolerated well Durrett, Elex [name]. ([id number]) with [name] pain level of 0 throughout and [name] pain level of 0 following the procedure. Post Debridement Measurements: 1.8cm length x 1.7cm width x 0.3cm depth; 0.721cm^3 volume. Character of Wound/Ulcer Post Debridement is stable. Severity of Tissue Post Debridement is: Fat layer exposed. Post procedure Diagnosis Wound  #2: Same as Pre-Procedure Plan Wound Cleansing: Wound #2 Right,Medial Lower Leg: May Shower, gently pat wound dry prior to applying new dressing. Anesthetic (add to Medication List): Wound #2 Right,Medial Lower Leg: Topical Lidocaine 4% cream applied to wound bed prior to debridement (In Clinic Only). Skin Barriers/Peri-Wound Care: Wound #2 Right,Medial Lower Leg: Triamcinolone Acetonide Ointment (TCA) Primary Wound Dressing: Wound #2 Right,Medial Lower Leg: Hydrogel - or [place] Jelly to moisten silver collagen Silver Collagen - moistened with hydrogel or KY jelly Secondary Dressing: Wound #2 Right,Medial Lower Leg: ABD and Kerlix/Conform Dressing Change Frequency: Wound #2 Right,Medial Lower Leg: Change Dressing Monday, Wednesday, Friday Follow-up Appointments: Wound #2 Right,Medial Lower Leg: Return Appointment in 1 week. Edema Control: Wound #2 Right,Medial Lower Leg: Elevate legs to the level of the heart and pump ankles as often as possible #1 continue with silver collagen and border foam #2 the area of erythema around the wound which I think was contact dermatitis is [name] lot better Electronic Signature(s) Signed: 02/08/2018 4:51:54 PM By: Linton Ham MD Entered By: Linton Ham on 02/08/2018 13:18:51 Korzeniewski, [name] ([id number]) -------------------------------------------------------------------------------- SuperBill Details Patient Name: [name]. Date of Service: 02/08/2018 Medical Record Number: [id number] Patient Account Number: [id number] Date of Birth/Sex: January 02, 1935 (83 y.o. M) Treating RN: Cornell Barman Primary Care Provider: SYSTEM, PCP Other Clinician: Referring Provider: Raechel Ache, DAVID Treating Provider/Extender: Tito Dine in Treatment: 1 Diagnosis Coding ICD-10 Codes Code Description [phone] Non-pressure chronic ulcer of right calf with fat layer exposed E11.622 Type 2 diabetes mellitus with other skin ulcer E11.51 Type 2 diabetes  mellitus with diabetic peripheral angiopathy without gangrene Facility Procedures CPT4 Code: [id number] Description: 01779 - DEB SUBQ TISSUE 20 SQ CM/< ICD-10 Diagnosis Description L97.212 Non-pressure chronic ulcer of right calf with fat layer exp E11.622 Type 2 diabetes mellitus with other skin ulcer Modifier: osed Quantity: 1 Physician Procedures CPT4 Code: [id number] Description: 23300 - WC PHYS SUBQ TISS 20 SQ CM ICD-10 Diagnosis Description L97.212 Non-pressure chronic ulcer of right calf with fat layer exp E11.622 Type 2 diabetes mellitus with other skin ulcer Modifier: osed Quantity: 1 Electronic Signature(s) Signed: 02/08/2018 4:51:54 PM By: Linton Ham MD Entered By: Linton Ham on 02/08/2018 13:19:15

## 2018-02-11 NOTE — Progress Notes (Signed)
[name] ([id number]) Visit Report for 02/08/2018 Arrival Information Details Patient Name: [name], [name] Date of Service: 02/08/2018 12:30 PM Medical Record Number: [id number] Patient Account Number: [id number] Date of Birth/Sex: [birth year] (83 y.o. M) Treating RN: Secundino Ginger Primary Care Khamia Stambaugh: SYSTEM, PCP Other Clinician: Referring Avyukth Bontempo: Raechel Ache, DAVID Treating Dailyn Reith/Extender: Tito Dine in Treatment: 1 Visit Information History Since Last Visit Added or deleted any medications: No Patient Arrived: Ambulatory Any new allergies or adverse reactions: No Arrival Time: 12:31 Had a fall or experienced change in No Accompanied By: spouse activities of daily living that may affect Transfer Assistance: None risk of falls: Patient Identification Verified: Yes Signs or symptoms of abuse/neglect since last visito No Secondary Verification Process Yes Hospitalized since last visit: No Completed: Implantable device outside of the clinic excluding No Patient Has Alerts: Yes cellular tissue based products placed in the center Patient Alerts: 01-27-18 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 Date of Service: 02/08/2018 12:30 PM Medical Record Number: [id number] Patient Account Number: [id number] Date of Birth/Sex: 19-Jan-1935 (83 y.o. M) Treating RN: Montey Hora Primary Care Zabella Wease: SYSTEM, PCP Other Clinician: Referring Corri Delapaz: Raechel Ache, DAVID Treating Lebert Lovern/Extender: Tito Dine in Treatment: 1 Encounter Discharge Information Items Discharge Condition:  Stable Ambulatory Status: Cane Discharge Destination: Home Transportation: Private Auto Accompanied By: spouse Schedule Follow-up Appointment: Yes Clinical Summary of Care: Post Procedure Vitals: Temperature (F): 97.7 Pulse (bpm): 69 Respiratory Rate (breaths/min): 16 Blood Pressure (mmHg): 122/74 Electronic Signature(s) Signed: 02/08/2018 3:07:44 PM By: Montey Hora Entered By: Montey Hora on 02/08/2018 15:07:44 [name] ([id number]) -------------------------------------------------------------------------------- Lower Extremity Assessment Details Patient Name: Jesse Floor A. Date of Service: 02/08/2018 12:30 PM Medical Record Number: [id number] Patient Account Number: [id number] Date of Birth/Sex: [birth year] (83 y.o. M) Treating RN: Secundino Ginger Primary Care Trevaun Rendleman: SYSTEM, PCP Other Clinician: Referring Audric Venn: Raechel Ache, DAVID Treating Ilina Xu/Extender: Tito Dine in Treatment: 1 Edema Assessment Assessed: [Left: No] [Right: No] [Left: Edema] [Right: :] Calf Left: Right: Point of Measurement: 32 cm From Medial Instep cm 34.5 cm Ankle Left: Right: Point of Measurement: 13 cm From Medial Instep cm 21 cm Vascular Assessment Claudication: Claudication Assessment [Right:None] Pulses: Dorsalis Pedis Palpable: [Right:Yes] Posterior Tibial Extremity colors, hair growth, and conditions: Extremity Color: [Right:Red] Hair Growth on Extremity: [Right:No] Temperature of Extremity: [Right:Warm] Capillary Refill: [Right:< 3 seconds] Toe Nail Assessment Left: Right: Thick: Yes Discolored: Yes Deformed: Yes Improper Length and Hygiene: Yes Electronic Signature(s) Signed: 02/08/2018 1:04:55 PM By: Secundino Ginger Entered By: Secundino Ginger on 02/08/2018 12:46:40 [name] ([id number]) -------------------------------------------------------------------------------- Multi Wound Chart Details Patient Name: Jesse Floor A. Date of Service:  02/08/2018 12:30 PM Medical Record Number: [id number] Patient Account Number: [id number] Date of Birth/Sex: 06-Aug-1934 (83 y.o. M) Treating RN: Cornell Barman Primary Care Gotti Alwin: SYSTEM, PCP Other Clinician: Referring Shamarcus Hoheisel: Raechel Ache, DAVID Treating Avedis Bevis/Extender: Tito Dine in Treatment: 1 Vital Signs Height(in): 80 Pulse(bpm): 109 Weight(lbs): 167 Blood Pressure(mmHg): 122/74 Body Mass Index(BMI): 26 Temperature(F): 97.7 Respiratory Rate 16 (breaths/min): Photos: [N/A:N/A] Wound Location: Right Lower Leg - Medial N/A N/A Wounding Event: Gradually Appeared N/A N/A Primary Etiology: Diabetic Wound/Ulcer of the N/A N/A Lower Extremity Comorbid History: Cataracts, Sleep Apnea, N/A N/A Arrhythmia, Congestive Heart Failure, Hypertension,  Peripheral Arterial Disease, Type II Diabetes, Osteoarthritis Date Acquired: 10/22/2017 N/A N/A Weeks of Treatment: 1 N/A N/A Wound Status: Open N/A N/A Measurements L x W x D 1.8x1.7x0.2 N/A N/A (cm) Area (cm) : 2.403 N/A N/A Volume (cm) : 0.481 N/A N/A % Reduction in Area: 22.70% N/A N/A % Reduction in Volume: 22.70% N/A N/A Classification: Grade 2 N/A N/A Exudate Amount: Medium N/A N/A Exudate Type: Serous N/A N/A Exudate Color: amber N/A N/A Wound Margin: Distinct, outline attached N/A N/A Granulation Amount: Small (1-33%) N/A N/A Granulation Quality: Red, Pink N/A N/A Necrotic Amount: Large (67-100%) N/A N/A Necrotic Tissue: Eschar, Adherent Slough N/A N/A Exposed Structures: N/A N/A [name], [name] ([id number]) Fat Layer (Subcutaneous Tissue) Exposed: Yes Fascia: No Tendon: No Muscle: No Joint: No Bone: No Epithelialization: None N/A N/A Debridement: Debridement - Excisional N/A N/A Pre-procedure 13:00 N/A N/A Verification/Time Out Taken: Pain Control: Other N/A N/A Tissue Debrided: Subcutaneous, Slough N/A N/A Level: Skin/Subcutaneous Tissue N/A N/A Debridement Area (sq cm): 3.06 N/A  N/A Instrument: Curette N/A N/A Bleeding: Minimum N/A N/A Hemostasis Achieved: Pressure N/A N/A Procedural Pain: 0 N/A N/A Post Procedural Pain: 0 N/A N/A Debridement Treatment Procedure was tolerated well N/A N/A Response: Post Debridement 1.8x1.7x0.3 N/A N/A Measurements L x W x D (cm) Post Debridement Volume: 0.721 N/A N/A (cm) Periwound Skin Texture: Excoriation: No N/A N/A Induration: No Callus: No Crepitus: No Rash: No Scarring: No Periwound Skin Moisture: Maceration: No N/A N/A Dry/Scaly: No Periwound Skin Color: Erythema: Yes N/A N/A Atrophie Blanche: No Cyanosis: No Ecchymosis: No Hemosiderin Staining: No Mottled: No Pallor: No Rubor: No Erythema Location: Circumferential N/A N/A Temperature: No Abnormality N/A N/A Tenderness on Palpation: Yes N/A N/A Wound Preparation: Ulcer Cleansing: Wound N/A N/A Cleanser Topical Anesthetic Applied: Other: lidocaine 4% Procedures Performed: Debridement N/A N/A Treatment Notes Electronic Signature(s) Signed: 02/08/2018 4:51:54 PM By: Linton Ham MD [name] ([id number]) Entered By: Linton Ham on 02/08/2018 13:12:28 [name] ([id number]) -------------------------------------------------------------------------------- Multi-Disciplinary Care Plan Details Patient Name: Jesse Floor A. Date of Service: 02/08/2018 12:30 PM Medical Record Number: [id number] Patient Account Number: [id number] Date of Birth/Sex: Feb 04, 1935 (83 y.o. M) Treating RN: Cornell Barman Primary Care Cydnie Deason: SYSTEM, PCP Other Clinician: Referring Genevie Elman: Raechel Ache, DAVID Treating My Madariaga/Extender: Tito Dine in Treatment: 1 Active Inactive ` Abuse / Safety / Falls / Self Care Management Nursing Diagnoses: Impaired physical mobility Goals: Patient will remain injury free related to falls Date Initiated: 02/01/2018 Target Resolution Date: 04/28/2018 Goal Status: Active Interventions: Assess fall risk  on admission and as needed Notes: ` Orientation to the Wound Care Program Nursing Diagnoses: Knowledge deficit related to the wound healing center program Goals: Patient/caregiver will verbalize understanding of the [place] Date Initiated: 02/01/2018 Target Resolution Date: 04/29/2018 Goal Status: Active Interventions: Provide education on orientation to the wound center Notes: ` Wound/Skin Impairment Nursing Diagnoses: Impaired tissue integrity Goals: Ulcer/skin breakdown will heal within 14 weeks Date Initiated: 02/01/2018 Target Resolution Date: 04/29/2018 Goal Status: Active Interventions: [name], [name] ([id number]) Assess patient/caregiver ability to obtain necessary supplies Assess patient/caregiver ability to perform ulcer/skin care regimen upon admission and as needed Assess ulceration(s) every visit Notes: Electronic Signature(s) Signed: 02/09/2018 5:07:26 PM By: Gretta Cool, BSN, RN, CWS, Kim RN, BSN Entered By: Gretta Cool, BSN, RN, CWS, Kim on 02/08/2018 13:00:47 [name] ([id number]) -------------------------------------------------------------------------------- Pain Assessment Details Patient Name: Jesse Floor A. Date of Service: 02/08/2018 12:30 PM Medical Record Number: [id number] Patient Account Number: [id number] Date of Birth/Sex:  12/29/1934 (83 y.o. M) Treating RN: Secundino Ginger Primary Care Shermar Friedland: SYSTEM, PCP Other Clinician: Referring Jeryl Umholtz: Raechel Ache, DAVID Treating Felice Hope/Extender: Tito Dine in Treatment: 1 Active Problems Location of Pain Severity and Description of Pain Patient Has Paino No Site Locations Pain Management and Medication Current Pain Management: Goals for Pain Management pt denies any pain at this time. Electronic Signature(s) Signed: 02/08/2018 1:04:55 PM By: Secundino Ginger Entered By: Secundino Ginger on 02/08/2018 12:38:15 [name]  ([id number]) -------------------------------------------------------------------------------- Patient/Caregiver Education Details Patient Name: Jesse Floor A. Date of Service: 02/08/2018 12:30 PM Medical Record Number: [id number] Patient Account Number: [id number] Date of Birth/Gender: 1934/06/10 (83 y.o. M) Treating RN: Montey Hora Primary Care Physician: SYSTEM, PCP Other Clinician: Referring Physician: Raechel Ache, DAVID Treating Physician/Extender: Tito Dine in Treatment: 1 Education Assessment Education Provided To: Patient and Caregiver Education Topics Provided Wound/Skin Impairment: Handouts: Other: wound care as ordered Methods: Demonstration, Explain/Verbal Responses: State content correctly Electronic Signature(s) Signed: 02/08/2018 4:04:54 PM By: Montey Hora Entered By: Montey Hora on 02/08/2018 15:08:01 [name] ([id number]) -------------------------------------------------------------------------------- Wound Assessment Details Patient Name: Jesse Floor A. Date of Service: 02/08/2018 12:30 PM Medical Record Number: [id number] Patient Account Number: [id number] Date of Birth/Sex: [birth year] (83 y.o. M) Treating RN: Secundino Ginger Primary Care Adelis Docter: SYSTEM, PCP Other Clinician: Referring Kevia Zaucha: Raechel Ache, DAVID Treating Alexias Margerum/Extender: Tito Dine in Treatment: 1 Wound Status Wound Number: 2 Primary Diabetic Wound/Ulcer of the Lower Extremity Etiology: Wound Location: Right Lower Leg - Medial Wound Open Wounding Event: Gradually Appeared Status: Date Acquired: 10/22/2017 Comorbid Cataracts, Sleep Apnea, Arrhythmia, Congestive Weeks Of Treatment: 1 History: Heart Failure, Hypertension, Peripheral Arterial Clustered Wound: No Disease, Type II Diabetes, Osteoarthritis Photos Photo Uploaded By: Secundino Ginger on 02/08/2018 13:01:10 Wound Measurements Length: (cm) 1.8 Width: (cm) 1.7 Depth: (cm) 0.2 Area: (cm)  2.403 Volume: (cm) 0.481 % Reduction in Area: 22.7% % Reduction in Volume: 22.7% Epithelialization: None Tunneling: No Undermining: No Wound Description Classification: Grade 2 Foul Odor Wound Margin: Distinct, outline attached Slough/Fi Exudate Amount: Medium Exudate Type: Serous Exudate Color: amber After Cleansing: No brino Yes Wound Bed Granulation Amount: Small (1-33%) Exposed Structure Granulation Quality: Red, Pink Fascia Exposed: No Necrotic Amount: Large (67-100%) Fat Layer (Subcutaneous Tissue) Exposed: Yes Necrotic Quality: Eschar, Adherent Slough Tendon Exposed: No Muscle Exposed: No Joint Exposed: No Bone Exposed: No Periwound Skin Texture Bourgoin, Mylik A. ([id number]) Texture Color No Abnormalities Noted: No No Abnormalities Noted: No Callus: No Atrophie Blanche: No Crepitus: No Cyanosis: No Excoriation: No Ecchymosis: No Induration: No Erythema: Yes Rash: No Erythema Location: Circumferential Scarring: No Hemosiderin Staining: No Mottled: No Moisture Pallor: No No Abnormalities Noted: No Rubor: No Dry / Scaly: No Maceration: No Temperature / Pain Temperature: No Abnormality Tenderness on Palpation: Yes Wound Preparation Ulcer Cleansing: Wound Cleanser Topical Anesthetic Applied: Other: lidocaine 4%, Treatment Notes Wound #2 (Right, Medial Lower Leg) 1. Cleansed with: Clean wound with Normal Saline 2. Anesthetic Topical Lidocaine 4% cream to wound bed prior to debridement 4. Dressing Applied: Hydrogel Prisma Ag 5. Secondary Dressing Applied Dry Gauze Kerlix/Conform Notes stretch net Electronic Signature(s) Signed: 02/08/2018 1:04:55 PM By: Secundino Ginger Entered By: Secundino Ginger on 02/08/2018 12:45:22 [name] ([id number]) -------------------------------------------------------------------------------- Vitals Details Patient Name: Jesse Floor A. Date of Service: 02/08/2018 12:30 PM Medical Record Number:  [id number] Patient Account Number: [id number] Date of Birth/Sex: 1935-04-03 (83 y.o. M) Treating RN: Secundino Ginger Primary Care Audreyana Huntsberry: SYSTEM, PCP Other Clinician: Referring Jermanie Minshall: Raechel Ache, DAVID Treating Jet Traynham/Extender: Ricard Dillon  Weeks in Treatment: 1 Vital Signs Time Taken: 12:30 Temperature (F): 97.7 Height (in): 67 Pulse (bpm): 69 Weight (lbs): 167 Respiratory Rate (breaths/min): 16 Body Mass Index (BMI): 26.2 Blood Pressure (mmHg): 122/74 Reference Range: 80 - 120 mg / dl Electronic Signature(s) Signed: 02/08/2018 1:04:55 PM By: Secundino Ginger Entered BySecundino Ginger on 02/08/2018 12:38:45

## 2018-02-14 ENCOUNTER — Other Ambulatory Visit: Payer: Self-pay

## 2018-02-14 ENCOUNTER — Ambulatory Visit (INDEPENDENT_AMBULATORY_CARE_PROVIDER_SITE_OTHER): Payer: Medicare Other

## 2018-02-14 ENCOUNTER — Ambulatory Visit
Admission: EM | Admit: 2018-02-14 | Discharge: 2018-02-14 | Disposition: A | Discharge status: Home / Self Care | Payer: Medicare Other | Attending: Family Medicine | Admitting: Family Medicine

## 2018-02-14 DIAGNOSIS — M10031 Idiopathic gout, right wrist: Secondary | ICD-10-CM

## 2018-02-14 MED ORDER — PREDNISONE 20 MG PO TABS: ORAL_TABLET | ORAL | 0 refills | Status: DC

## 2018-02-14 MED ORDER — HYDROCODONE-ACETAMINOPHEN 5-325 MG PO TABS: ORAL_TABLET | ORAL | 0 refills | Status: DC

## 2018-02-14 NOTE — ED Triage Notes (Signed)
Patient complains of right hand pain and swelling with some weakness that started on Sunday. Patient denies injury to the area. Patient states that he thinks it may be a build up of fluid.

## 2018-02-14 NOTE — ED Provider Notes (Signed)
[facility]    CSN: [id number] Arrival date & time: 02/14/18  1401     History   Chief Complaint Chief Complaint  Patient presents with  . Hand Pain    right    HPI CHRISHAUN SASSO is a 83 y.o. male.   83 yo male with a c/o sudden right wrist pain and slight swelling 2 days ago. States he woke up Sunday morning with pain. He denies any injuries, falls, insect bites, fevers, chills, skin lesions or rash, numbness/tingling.   The history is provided by the patient.  Hand Pain     Past Medical History:  Diagnosis Date  . Arthritis   . Atrial fibrillation ([place])   . Carcinoma of prostate ([place])   . CHF (congestive heart failure) ([place])   . Chronic kidney disease   . Diabetes mellitus without complication ([place])   . ED (erectile dysfunction)   . Frequent urination   . Hyperlipidemia   . Hypertension   . Moderate mitral insufficiency   . Peripheral vascular disease ([place])   . Pneumonia 04/2016  . Prostate cancer ([place])   . Sleep apnea    OSA--USE C-PAP  . Ulcer of left foot due to type 2 diabetes mellitus ([place])   . Urinary stress incontinence, male     Patient Active Problem List   Diagnosis Date Noted  . NSTEMI (non-ST elevated myocardial infarction) ([place]) 01/16/2018  . Acute CHF (congestive heart failure) ([employer]) 11/23/2017  . Atherosclerosis of native arteries of the extremities with ulceration ([place]) 11/18/2017  . Atrial fibrillation with rapid ventricular response ([place]) 10/15/2017  . BKA stump complication ([place]) 54/65/0354  . 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 S/P BKA (below knee amputation) unilateral, left ([place]) 02/01/2017  . Chronic diastolic heart failure ([place]) 01/21/2017  . Pressure injury of skin 01/18/2017  . Atherosclerosis of artery of extremity with gangrene ([place]) 01/05/2017  . Diabetic foot ulcer ([place]) 12/29/2016  . Ataxia 10/21/2016  . History of femoral angiogram 09/06/2016  . Left leg pain  09/06/2016  . Leukocytosis 09/06/2016  . Generalized weakness 09/06/2016  . A-fib ([place]) 08/30/2016  . Pneumonia 05/19/2016  . Hyperlipidemia 04/20/2016  . Back pain 04/19/2016  . Type 2 diabetes mellitus treated with insulin ([place]) 04/19/2016  . Essential hypertension 04/19/2016  . PAD (peripheral artery disease) ([place]) 04/19/2016  . Erectile dysfunction following radical prostatectomy 06/25/2015  . History of prostate cancer 12/23/2014  . Incontinence 12/23/2014    Past Surgical History:  Procedure Laterality Date  . AMPUTATION Left 01/12/2017   Procedure: AMPUTATION BELOW KNEE;  Surgeon: Algernon Huxley, MD;  Location: [facility];  Service: General;  Laterality: Left;  . APLIGRAFT PLACEMENT Left 10/15/2016   Procedure: APLIGRAFT PLACEMENT;  Surgeon: Albertine Patricia, DPM;  Location: [facility];  Service: Podiatry;  Laterality: Left;  necrotic ulcer  . CHOLECYSTECTOMY    . EYE SURGERY Bilateral    Cataract Extraction with IOL  . IRRIGATION AND DEBRIDEMENT FOOT Left 10/15/2016   Procedure: IRRIGATION AND DEBRIDEMENT FOOT-EXCISIONAL DEBRIDEMENT OF SKIN 3RD, 4TH AND 5TH TOES WITH APPLICATION OF APLIGRAFT;  Surgeon: Albertine Patricia, DPM;  Location: [facility];  Service: Podiatry;  Laterality: Left;  necrotic,gangrene  . IRRIGATION AND DEBRIDEMENT FOOT Left 12/09/2016   Procedure: IRRIGATION AND DEBRIDEMENT FOOT-MUSCLE/FASCIA, RESECTION OF FOURTH AND FIFTH METATARSAL NECROTIC BONE;  Surgeon: Albertine Patricia, DPM;  Location: [facility];  Service: Podiatry;  Laterality: Left;  . LOWER EXTREMITY ANGIOGRAPHY Left 08/16/2016  Procedure: Lower Extremity Angiography;  Surgeon: Algernon Huxley, MD;  Location: [place] [facility];  Service: Cardiovascular;  Laterality: Left;  . LOWER EXTREMITY ANGIOGRAPHY Left 09/01/2016   Procedure: Lower Extremity Angiography;  Surgeon: Algernon Huxley, MD;  Location: [place] [facility];  Service: Cardiovascular;  Laterality: Left;  . LOWER EXTREMITY ANGIOGRAPHY Left 10/14/2016    Procedure: Lower Extremity Angiography;  Surgeon: Algernon Huxley, MD;  Location: [place] [facility];  Service: Cardiovascular;  Laterality: Left;  . LOWER EXTREMITY ANGIOGRAPHY Left 12/20/2016   Procedure: Lower Extremity Angiography;  Surgeon: Algernon Huxley, MD;  Location: [place] [facility];  Service: Cardiovascular;  Laterality: Left;  . LOWER EXTREMITY INTERVENTION  09/01/2016   Procedure: Lower Extremity Intervention;  Surgeon: Algernon Huxley, MD;  Location: [place] [facility];  Service: Cardiovascular;;  . PROSTATE SURGERY     removal  . TONSILLECTOMY     as a child       Home Medications    Prior to Admission medications   Medication Sig Start Date End Date Taking? Authorizing Provider  aspirin EC 81 MG tablet Take 81 mg by mouth daily.   Yes [provider]  atorvastatin (LIPITOR) 80 MG tablet Take 80 mg by mouth daily at 6 PM.    Yes [provider]  collagenase (SANTYL) ointment Apply topically daily. Right leg wound 11/28/17  Yes Wieting, Richard, MD  digoxin (LANOXIN) 0.125 MG tablet Take 1 tablet (0.125 mg total) by mouth daily. 01/18/18  Yes [name], MD  diltiazem (CARDIZEM CD) 120 MG 24 hr capsule Take 1 capsule (120 mg total) by mouth daily. 11/28/17  Yes Wieting, Richard, MD  empagliflozin (JARDIANCE) 25 MG TABS tablet Take 25 mg by mouth daily.   Yes [provider]  furosemide (LASIX) 20 MG tablet Take 1 tablet (20 mg total) by mouth daily. 01/18/18  Yes [name], MD  insulin glargine (LANTUS) 100 UNIT/ML injection Inject 0.1 mLs (10 Units total) into the skin at bedtime. Patient taking differently: Inject 15 Units into the skin at bedtime.  10/28/17  Yes Mody, Sital, MD  insulin lispro (HUMALOG) 100 UNIT/ML injection Inject 0.03 mLs (3 Units total) into the skin 3 (three) times daily with meals. Patient taking differently: Inject 3 Units into the skin 3 (three) times daily with meals. Plus 1 additional unit for every 50 BG points  over 200 (max 5 additional units per dose) 11/28/17  Yes Wieting, Richard, MD  isosorbide mononitrate (IMDUR) 30 MG 24 hr tablet Take 1 tablet (30 mg total) by mouth daily. 01/19/18  Yes [name], MD  lisinopril (PRINIVIL,ZESTRIL) 2.5 MG tablet Take 1 tablet (2.5 mg total) by mouth daily. 11/28/17 11/28/18 Yes Wieting, Richard, MD  metoprolol tartrate (LOPRESSOR) 25 MG tablet Take 1 tablet (25 mg total) by mouth 3 (three) times daily. 11/28/17  Yes Wieting, Richard, MD  polyethylene glycol (MIRALAX / GLYCOLAX) packet Take 17 g by mouth daily. 11/28/17  Yes Wieting, Richard, MD  potassium chloride SA (K-DUR,KLOR-CON) 20 MEQ tablet Take 20 mEq by mouth daily.   Yes [provider]  risperiDONE (RISPERDAL) 0.5 MG tablet Take 1 tablet (0.5 mg total) by mouth at bedtime. 10/28/17  Yes Bettey Costa, MD  rivaroxaban (XARELTO) 20 MG TABS tablet Take 1 tablet (20 mg total) by mouth daily with supper. 10/28/17  Yes Bettey Costa, MD  HYDROcodone-acetaminophen (NORCO/VICODIN) 5-325 MG tablet 1-2 tabs po qd prn 02/14/18   Norval Gable, MD  predniSONE (DELTASONE) 20 MG tablet 2 tabs po qd x 3 days, then 1 tab po qd x 3 days, then half a tab po qd x 2 days 02/14/18   Norval Gable, MD    Family History Family History  Problem Relation Age of Onset  . Hypertension Mother   . Diabetes Mother   . Prostate cancer Neg Hx   . Bladder Cancer Neg Hx   . Kidney disease Neg Hx     Social History Social History   Tobacco Use  . Smoking status: Former Smoker    Packs/day: 0.25    Types: Cigarettes    Last attempt to quit: 10/14/1994    Years since quitting: 23.3  . Smokeless tobacco: Never Used  Substance Use Topics  . Alcohol use: No    Alcohol/week: 0.0 standard drinks  . Drug use: No     Allergies   Contrast media [iodinated diagnostic agents]; Iohexol; Metrizamide; and Latex   Review of Systems Review of Systems   Physical Exam Triage Vital Signs ED Triage Vitals  Enc Vitals Group      BP 02/14/18 1422 130/77     Pulse Rate 02/14/18 1422 68     Resp 02/14/18 1422 18     Temp 02/14/18 1422 97.9 F (36.6 C)     Temp Source 02/14/18 1422 Oral     SpO2 02/14/18 1422 95 %     Weight 02/14/18 1421 175 lb (79.4 kg)     Height 02/14/18 1421 5\' 7"  (1.702 m)     Head Circumference --      Peak Flow --      Pain Score 02/14/18 1421 6     Pain Loc --      Pain Edu? --      Excl. in [place]? --    No data found.  Updated Vital Signs BP 130/77 (BP Location: Left Arm)   Pulse 68   Temp 97.9 F (36.6 C) (Oral)   Resp 18   Ht 5\' 7"  (1.702 m)   Wt 79.4 kg   SpO2 95%   BMI 27.41 kg/m   Visual Acuity Right Eye Distance:   Left Eye Distance:   Bilateral Distance:    Right Eye Near:   Left Eye Near:    Bilateral Near:     Physical Exam  Constitutional: He appears well-developed and well-nourished. No distress.  Musculoskeletal:       Right wrist: He exhibits tenderness, bony tenderness and swelling (mild; no skin lesions ). He exhibits normal range of motion, no effusion, no crepitus, no deformity and no laceration.  Skin: He is not diaphoretic.  Nursing note and vitals reviewed.    UC Treatments / Results  Labs (all labs ordered are listed, but only abnormal results are displayed) Labs Reviewed - No data to display  EKG None  Radiology Dg Wrist Complete Right  Result Date: 02/14/2018 CLINICAL DATA:  Nontraumatic wrist pain EXAM: RIGHT WRIST - COMPLETE 3+ VIEW COMPARISON:  Right wrist radiograph 02/12/2017 FINDINGS: There is no evidence of fracture or dislocation. There is no evidence of arthropathy or other focal bone abnormality. Soft tissues are unremarkable. Extensive vascular calcification. IMPRESSION: No fracture, dislocation or advanced arthropathy of the right wrist. Extensive atherosclerotic vascular calcification. Electronically Signed   By: Ulyses Jarred M.D.   On: 02/14/2018 16:32    Procedures Procedures (including critical care time)  Medications  Ordered in UC Medications - No data to display  Initial Impression /  Assessment and Plan / UC Course  I have reviewed the triage vital signs and the nursing notes.  Pertinent labs & imaging results that were available during my care of the patient were reviewed by me and considered in my medical decision making (see chart for details).     Final Clinical Impressions(s) / UC Diagnoses   Final diagnoses:  Acute idiopathic gout of right wrist    ED Prescriptions    Medication Sig Dispense Auth. Provider   predniSONE (DELTASONE) 20 MG tablet 2 tabs po qd x 3 days, then 1 tab po qd x 3 days, then half a tab po qd x 2 days 10 tablet Norval Gable, MD   HYDROcodone-acetaminophen (NORCO/VICODIN) 5-325 MG tablet 1-2 tabs po qd prn 6 tablet [place] Coste, Linward Foster, MD     1. x-ray results and diagnosis reviewed with patient 2. rx as per orders above; reviewed possible side effects, interactions, risks and benefits  3. Recommend supportive treatment with rest, ice, elevation 4. Follow-up prn if symptoms worsen or don't improve   Controlled Substance Prescriptions [place] Controlled Substance Registry consulted? Not Applicable   Norval Gable, MD 02/14/18 2116

## 2018-02-15 ENCOUNTER — Encounter: Payer: Medicare Other | Admitting: Internal Medicine

## 2018-02-15 DIAGNOSIS — E11622 Type 2 diabetes mellitus with other skin ulcer: Secondary | ICD-10-CM | POA: Diagnosis not present

## 2018-02-17 NOTE — Progress Notes (Signed)
[name], [name] ([id number]) Visit Report for 02/15/2018 Debridement Details Patient Name: [name], [name] Date of Service: 02/15/2018 1:45 PM Medical Record Number: [id number] Patient Account Number: [id number] Date of Birth/Sex: 12/06/1934 (83 y.o. M) Treating RN: Cornell Barman Primary Care Provider: Katheren Shams Other Clinician: Referring Provider: Raechel Ache, DAVID Treating Provider/Extender: Tito Dine in Treatment: 2 Debridement Performed for Wound #2 Right,Medial Lower Leg Assessment: Performed By: Physician Ricard Dillon, MD Debridement Type: Debridement Severity of Tissue Pre Fat layer exposed Debridement: Level of Consciousness (Pre- Awake and Alert procedure): Pre-procedure Verification/Time Yes - 14:29 Out Taken: Start Time: 14:29 Pain Control: Other : lidocaine 4% Total Area Debrided (L x W): 1.2 (cm) x 1.2 (cm) = 1.44 (cm) Tissue and other material Viable, Non-Viable, Slough, Subcutaneous, Slough debrided: Level: Skin/Subcutaneous Tissue Debridement Description: Excisional Instrument: Curette Bleeding: Minimum Hemostasis Achieved: Pressure End Time: 14:30 Procedural Pain: 0 Post Procedural Pain: 0 Response to Treatment: Procedure was tolerated well Level of Consciousness Awake and Alert (Post-procedure): Post Debridement Measurements of Total Wound Length: (cm) 1.2 Width: (cm) 1.2 Depth: (cm) 0.3 Volume: (cm) 0.339 Character of Wound/Ulcer Post Debridement: Requires Further Debridement Severity of Tissue Post Debridement: Fat layer exposed Post Procedure Diagnosis Same as Pre-procedure Electronic Signature(s) Signed: 02/15/2018 5:31:05 PM By: Gretta Cool, BSN, RN, CWS, Kim RN, BSN Signed: 02/15/2018 6:15:06 PM By: Linton Ham MD Entered By: Linton Ham on 02/15/2018 14:58:12 [place], [name] ([id number]) [place], Welden A. ([id number]) -------------------------------------------------------------------------------- HPI  Details Patient Name: Jesse Floor A. Date of Service: 02/15/2018 1:45 PM Medical Record Number: [id number] Patient Account Number: [id number] Date of Birth/Sex: 1934/12/02 (83 y.o. M) Treating RN: Cornell Barman Primary Care Provider: Katheren Shams Other Clinician: Referring Provider: Raechel Ache, DAVID Treating Provider/Extender: Tito Dine in Treatment: 2 History of Present Illness Location: Left foot surgical site in the distal/lateral region Quality: Patient describes the pain a sore/burning sensation Severity: 3/10 at rest and 8/10 with manipulation Duration: Patientos surgery was on 10/15/16 with Dr. Albertine Patricia DPM Context: Patient had an amputation of the left 3rd - 5th toes on 10/15/16 and the surgical site has not healed following Modifying Factors: Currently this has been packed with saline soaked gauze in a wet to dry format Associated Signs and Symptoms: HTN, DM, A-fib, chronic Coumadin use HPI Description: 11/29/16 patient presents today for evaluation concerning a nonhealing surgical wound following amputation of his left third through fifth toes which was performed on 10/15/16. Unfortunately following this amputation the circle side has not closed appropriately and he continues to have a significant amount of discomfort. He did have an x-ray performed on 10/21/16 which showed him being status post D3 toe amputation but otherwise no acute findings. Patient also has venous imaging of the left lower extremity which revealed no evidence of DVT on 10/22/16. His last hemoglobin A 1C was 8.5 and this was on 10/22/16 and prior to his amputation patient did have vascular surgery with Dr. Leotis Pain where he had a catheter placed in the left anterior tibialis/dorsalis pedis artery from the right femoral approach. Percutaneous transluminal angioplasty of the left anterior tibial artery/dorsalis pedis artery was performed. This procedure apparently went very well.  Unfortunately his healing just has not been what both Dr. Elvina Mattes nor himself were expecting or wanting. The question posed to [place] today is whether hyperbaric therapy would be of benefit for him. READMISSION 02/01/18 This is now a 83 year old man we had in the clinic over 2 years ago. At that point he was here  for evaluation of amputations of several of his toes on his left foot with a nonhealing surgical wound. He was only here for 1 visit. The patient tells me he went on to have a left below-knee amputation slightly over a year ago. Sometime earlier this year perhaps in April he traumatized his right shin and he has had an open wound here since then. He is already seen Dr. Lucky Cowboy and vein and vascular. He had noninvasive tests that showed an ABI on the right of 1.23 but a TBI in the right of only 0.27. He had monophasic waveforms at the anterior tibial and biphasic at the posterior tibial. The overall interpretation was that although his resting right ankle brachial index is within normal limits this may be falsely elevated. Furthermore his toe brachial index was quite a bit reduced. According to the last note from vein and vascular on 01/27/18 they discussed angiogram with possible revascularization on the right. He was felt to be reasonably high risk because of his and inability inability to stay still requiring general anesthesia therefore. They told him to think about it and get back to them. He was referred here for review of this wound. As I understand things they've been using Xeroform I'm not clear what they've been putting on this although they did talk about "an ointment". 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 During his admission to hospital in July MRSA was apparently cultured from  this wound and he received a course of Doxy. As he has had recent noninvasive arterial studies done on 01/27/18,no ABIs were felt to be necessary here. 02/08/18; patient arrives in with a better looking wound. Dimensions are smaller. using silver collagen 02/15/18; wound is measuring smaller. Still with adherent surface material requiring debridement. We've been using silver collagen Electronic Signature(s) Signed: 02/15/2018 6:15:06 PM By: Linton Ham MD Entered By: Linton Ham on 02/15/2018 14:59:00 [name] ([id number]) [name], [name] ([id number]) -------------------------------------------------------------------------------- Physical Exam Details Patient Name: Jesse Floor A. Date of Service: 02/15/2018 1:45 PM Medical Record Number: [id number] Patient Account Number: [id number] Date of Birth/Sex: 10/28/1934 (83 y.o. M) Treating RN: Cornell Barman Primary Care Provider: Katheren Shams Other Clinician: Referring Provider: Raechel Ache, DAVID Treating Provider/Extender: Tito Dine in Treatment: 2 Notes wound exam; wound on the right anterior tibial area. Again surface debridement with a #3 curet. The wound cleans up quite nicely. There is depth on the medial part of this word is up against the lateral part of the tibia. Electronic Signature(s) Signed: 02/15/2018 6:15:06 PM By: Linton Ham MD Entered By: Linton Ham on 02/15/2018 15:00:13 [name] ([id number]) -------------------------------------------------------------------------------- Physician Orders Details Patient Name: Jesse Floor A. Date of Service: 02/15/2018 1:45 PM Medical Record Number: [id number] Patient Account Number: [id number] Date of Birth/Sex: 04-13-1935 (83 y.o. M) Treating RN: Cornell Barman Primary Care Provider: Katheren Shams Other Clinician: Referring Provider: Raechel Ache, DAVID Treating Provider/Extender: Tito Dine in Treatment: 2 Verbal /  Phone Orders: No Diagnosis Coding Wound Cleansing Wound #2 Right,Medial Lower Leg o May Shower, gently pat wound dry prior to applying new dressing. Anesthetic (add to Medication List) Wound #2 Right,Medial Lower Leg o Topical Lidocaine 4% cream applied to wound bed prior to debridement (In Clinic Only). Skin Barriers/Peri-Wound Care Wound #2 Right,Medial Lower Leg o Triamcinolone  Acetonide Ointment (TCA) Primary Wound Dressing Wound #2 Right,Medial Lower Leg o Hydrogel - or KY Jelly to moisten silver collagen o Silver Collagen - moistened with hydrogel or KY jelly Secondary Dressing Wound #2 Right,Medial Lower Leg o ABD and Kerlix/Conform Dressing Change Frequency Wound #2 Right,Medial Lower Leg o Change Dressing Monday, Wednesday, Friday Follow-up Appointments Wound #2 Right,Medial Lower Leg o Return Appointment in 1 week. Edema Control Wound #2 Right,Medial Lower Leg o Elevate legs to the level of the heart and pump ankles as often as possible Electronic Signature(s) Signed: 02/15/2018 5:31:05 PM By: Gretta Cool, BSN, RN, CWS, Kim RN, BSN Signed: 02/15/2018 6:15:06 PM By: Linton Ham MD Entered By: Gretta Cool, BSN, RN, CWS, Kim on 02/15/2018 14:30:42 [name] ([id number]) -------------------------------------------------------------------------------- Problem List Details Patient Name: Jesse Floor A. Date of Service: 02/15/2018 1:45 PM Medical Record Number: [id number] Patient Account Number: [id number] Date of Birth/Sex: January 18, 1935 (83 y.o. M) Treating RN: Cornell Barman Primary Care Provider: Katheren Shams Other Clinician: Referring Provider: Raechel Ache, DAVID Treating Provider/Extender: Tito Dine in Treatment: 2 Active Problems ICD-10 Evaluated Encounter Code Description Active Date Today Diagnosis L97.212 Non-pressure chronic ulcer of right calf with fat layer exposed 02/01/2018 No Yes E11.622 Type 2 diabetes mellitus with other  skin ulcer 02/01/2018 No Yes E11.51 Type 2 diabetes mellitus with diabetic peripheral angiopathy 02/01/2018 No Yes without gangrene Inactive Problems Resolved Problems Electronic Signature(s) Signed: 02/15/2018 6:15:06 PM By: Linton Ham MD Entered By: Linton Ham on 02/15/2018 14:57:39 [place], BeckleyMarland Kitchen ([id number]) -------------------------------------------------------------------------------- Progress Note Details Patient Name: Jesse Floor A. Date of Service: 02/15/2018 1:45 PM Medical Record Number: [id number] Patient Account Number: [id number] Date of Birth/Sex: Feb 04, 1935 (83 y.o. M) Treating RN: Cornell Barman Primary Care Provider: Katheren Shams Other Clinician: Referring Provider: Raechel Ache, DAVID Treating Provider/Extender: Tito Dine in Treatment: 2 Subjective History of Present Illness (HPI) The following HPI elements were documented for the patient's wound: Location: Left foot surgical site in the distal/lateral region Quality: Patient describes the pain a sore/burning sensation Severity: 3/10 at rest and 8/10 with manipulation Duration: Patient s surgery was on 10/15/16 with Dr. Albertine Patricia DPM Context: Patient had an amputation of the left 3rd - 5th toes on 10/15/16 and the surgical site has not healed following Modifying Factors: Currently this has been packed with saline soaked gauze in a wet to dry format Associated Signs and Symptoms: HTN, DM, A-fib, chronic Coumadin use 11/29/16 patient presents today for evaluation concerning a nonhealing surgical wound following amputation of his left third through fifth toes which was performed on 10/15/16. Unfortunately following this amputation the circle side has not closed appropriately and he continues to have a significant amount of discomfort. He did have an x-ray performed on 10/21/16 which showed him being status post D3 toe amputation but otherwise no acute findings. Patient also has venous imaging  of the left lower extremity which revealed no evidence of DVT on 10/22/16. His last hemoglobin A 1C was 8.5 and this was on 10/22/16 and prior to his amputation patient did have vascular surgery with Dr. Leotis Pain where he had a catheter placed in the left anterior tibialis/dorsalis pedis artery from the right femoral approach. Percutaneous transluminal angioplasty of the left anterior tibial artery/dorsalis pedis artery was performed. This procedure apparently went very well. Unfortunately his healing just has not been what both Dr. Elvina Mattes nor himself were expecting or wanting. The question posed to [place] today is whether hyperbaric therapy would be of benefit for him. READMISSION 02/01/18  This is now a 83 year old man we had in the clinic over 2 years ago. At that point he was here for evaluation of amputations of several of his toes on his left foot with a nonhealing surgical wound. He was only here for 1 visit. The patient tells me he went on to have a left below-knee amputation slightly over a year ago. Sometime earlier this year perhaps in April he traumatized his right shin and he has had an open wound here since then. He is already seen Dr. Lucky Cowboy and vein and vascular. He had noninvasive tests that showed an ABI on the right of 1.23 but a TBI in the right of only 0.27. He had monophasic waveforms at the anterior tibial and biphasic at the posterior tibial. The overall interpretation was that although his resting right ankle brachial index is within normal limits this may be falsely elevated. Furthermore his toe brachial index was quite a bit reduced. According to the last note from vein and vascular on 01/27/18 they discussed angiogram with possible revascularization on the right. He was felt to be reasonably high risk because of his and inability inability to stay still requiring general anesthesia therefore. They told him to think about it and get back to them. He was referred here for review of  this wound. As I understand things they've been using Xeroform I'm not clear what they've been putting on this although they did talk about "an ointment". 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 During his admission to hospital in July MRSA was apparently cultured from this wound and he received a course of Doxy. As he has had recent noninvasive arterial studies done on 01/27/18,no ABIs were felt to be necessary here. 02/08/18; patient arrives in with a better looking wound. Dimensions are smaller. using silver collagen 02/15/18; wound is measuring smaller. Still with adherent surface material requiring debridement. We've been using silver collagen [name], [name] ([id number]) Objective Constitutional Vitals Time Taken: 2:05 PM, Height: 67 in, Weight: 167 lbs, BMI: 26.2, Temperature: 97.8 F, Pulse: 87 bpm, Respiratory Rate: 16 breaths/min, Blood Pressure: 125/66 mmHg. Integumentary (Hair, Skin) Wound #2 status is Open. Original cause of wound was Gradually Appeared. The wound is located on the Right,Medial Lower Leg. The wound measures 1.2cm length x 1.2cm width x 0.2cm depth; 1.131cm^2 area and 0.226cm^3 volume. There is Fat Layer (Subcutaneous Tissue) Exposed exposed. There is no tunneling or undermining noted. There is a medium amount of serous drainage noted. The wound margin is distinct with the outline attached to the wound base. There is small (1-33%) red, pink granulation within the wound bed. There is a large (67-100%) amount of necrotic tissue within the wound bed including Eschar and Adherent Slough. The periwound skin appearance did not exhibit: Callus, Crepitus, Excoriation, Induration, Rash, Scarring, Dry/Scaly, Maceration, Atrophie Blanche, Cyanosis, Ecchymosis, Hemosiderin  Staining, Mottled, Pallor, Rubor, Erythema. Periwound temperature was noted as No Abnormality. The periwound has tenderness on palpation. Assessment Active Problems ICD-10 Non-pressure chronic ulcer of right calf with fat layer exposed Type 2 diabetes mellitus with other skin ulcer Type 2 diabetes mellitus with diabetic peripheral angiopathy without gangrene Procedures Wound #2 Pre-procedure diagnosis of Wound #2 is a Diabetic Wound/Ulcer of the Lower Extremity located on the Right,Medial Lower Leg .Severity of Tissue Pre Debridement  is: Fat layer exposed. There was a Excisional Skin/Subcutaneous Tissue Debridement with a total area of 1.44 sq cm performed by Ricard Dillon, MD. With the following instrument(s): Curette to remove Viable and Non-Viable tissue/material. Material removed includes Subcutaneous Tissue and Slough and after achieving pain control using Other (lidocaine 4%). No specimens were taken. A time out was conducted at 14:29, prior to the start of the procedure. A Minimum amount of bleeding was controlled with Pressure. The procedure was tolerated well with a pain level of 0 throughout and a pain level of 0 following the procedure. Post Debridement Measurements: 1.2cm length x 1.2cm width x 0.3cm depth; 0.339cm^3 volume. Character of Wound/Ulcer Post Debridement requires further debridement. Severity of Tissue Post Debridement is: Fat layer exposed. Post procedure Diagnosis Wound #2: Same as Pre-Procedure [name], [name] ([id number]) Plan Wound Cleansing: Wound #2 Right,Medial Lower Leg: May Shower, gently pat wound dry prior to applying new dressing. Anesthetic (add to Medication List): Wound #2 Right,Medial Lower Leg: Topical Lidocaine 4% cream applied to wound bed prior to debridement (In Clinic Only). Skin Barriers/Peri-Wound Care: Wound #2 Right,Medial Lower Leg: Triamcinolone Acetonide Ointment (TCA) Primary Wound Dressing: Wound #2 Right,Medial Lower  Leg: Hydrogel - or [place] Jelly to moisten silver collagen Silver Collagen - moistened with hydrogel or KY jelly Secondary Dressing: Wound #2 Right,Medial Lower Leg: ABD and Kerlix/Conform Dressing Change Frequency: Wound #2 Right,Medial Lower Leg: Change Dressing Monday, Wednesday, Friday Follow-up Appointments: Wound #2 Right,Medial Lower Leg: Return Appointment in 1 week. Edema Control: Wound #2 Right,Medial Lower Leg: Elevate legs to the level of the heart and pump ankles as often as possible #1 I'm going to continue with the silver collagen. Although the recurrent material over the surface of the wound is a bit disturbing the wound is getting smaller at a rapid pace. Electronic Signature(s) Signed: 02/15/2018 6:15:06 PM By: Linton Ham MD Entered By: Linton Ham on 02/15/2018 15:00:53 [name] ([id number]) -------------------------------------------------------------------------------- SuperBill Details Patient Name: Jesse Floor A. Date of Service: 02/15/2018 Medical Record Number: [id number] Patient Account Number: [id number] Date of Birth/Sex: [birth year] (83 y.o. M) Treating RN: Cornell Barman Primary Care Provider: Katheren Shams Other Clinician: Referring Provider: Raechel Ache, DAVID Treating Provider/Extender: Tito Dine in Treatment: 2 Diagnosis Coding ICD-10 Codes Code Description [phone] Non-pressure chronic ulcer of right calf with fat layer exposed E11.622 Type 2 diabetes mellitus with other skin ulcer E11.51 Type 2 diabetes mellitus with diabetic peripheral angiopathy without gangrene Facility Procedures CPT4 Code: [id number] Description: 68115 - DEB SUBQ TISSUE 20 SQ CM/< ICD-10 Diagnosis Description L97.212 Non-pressure chronic ulcer of right calf with fat layer exp Modifier: osed Quantity: 1 Physician Procedures CPT4 Code: [id number] Description: 59741 - WC PHYS SUBQ TISS 20 SQ CM ICD-10 Diagnosis Description L97.212 Non-pressure  chronic ulcer of right calf with fat layer exp Modifier: osed Quantity: 1 Electronic Signature(s) Signed: 02/15/2018 6:15:06 PM By: Linton Ham MD Entered By: Linton Ham on 02/15/2018 15:01:28

## 2018-02-17 NOTE — Progress Notes (Signed)
TAAHIR, GRISBY ([id number]) Visit Report for 02/15/2018 Arrival Information Details Patient Name: Jesse, SOOKDEO A. Date of Service: 02/15/2018 1:45 PM Medical Record Number: [id number] Patient Account Number: [id number] Date of Birth/Sex: 1934/12/06 (83 y.o. M) Treating RN: Roger Shelter Primary Care Breandan People: Katheren Shams Other Clinician: Referring Braxxton Stoudt: Raechel Ache, DAVID Treating Eldridge Marcott/Extender: Tito Dine in Treatment: 2 Visit Information History Since Last Visit Added or deleted any medications: Yes Patient Arrived: Cane Any new allergies or adverse reactions: No Arrival Time: 14:01 Had a fall or experienced change in No Accompanied By: wife activities of daily living that may affect Transfer Assistance: None risk of falls: Patient Identification Verified: Yes Signs or symptoms of abuse/neglect since last visito No Secondary Verification Process Yes Hospitalized since last visit: No Completed: Implantable device outside of the clinic excluding No Patient Has Alerts: Yes cellular tissue based products placed in the center Patient Alerts: 01-27-18 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 Date of Service: 02/15/2018 1:45 PM Medical Record Number: [id number] Patient Account Number: [id number] Date of Birth/Sex: 12-28-1934 (83 y.o. M) Treating RN: Roger Shelter Primary Care Ozell Ferrera: Katheren Shams Other Clinician: Referring Leona Pressly: Raechel Ache, DAVID Treating Tache Bobst/Extender: Tito Dine in Treatment: 2 Encounter Discharge Information  Items Discharge Condition: Stable Ambulatory Status: Cane Discharge Destination: Home Transportation: Private Auto Accompanied By: spouse Schedule Follow-up Appointment: Yes Clinical Summary of Care: Post Procedure Vitals: Temperature (F): 97.8 Pulse (bpm): 87 Respiratory Rate (breaths/min): 16 Blood Pressure (mmHg): 125/66 Electronic Signature(s) Signed: 02/15/2018 4:06:29 PM By: Roger Shelter Entered By: Roger Shelter on 02/15/2018 14:38:49 [name], [name] ([id number]) -------------------------------------------------------------------------------- Lower Extremity Assessment Details Patient Name: Jesse Floor A. Date of Service: 02/15/2018 1:45 PM Medical Record Number: [id number] Patient Account Number: [id number] Date of Birth/Sex: 01-Mar-1935 (83 y.o. M) Treating RN: Roger Shelter Primary Care Olumide Dolinger: Katheren Shams Other Clinician: Referring Antonyo Hinderer: Raechel Ache, DAVID Treating Lindamarie Maclachlan/Extender: Tito Dine in Treatment: 2 Edema Assessment Assessed: [Left: No] [Right: No] Edema: [Left: Ye] [Right: s] Calf Left: Right: Point of Measurement: 32 cm From Medial Instep cm 35.5 cm Ankle Left: Right: Point of Measurement: 13 cm From Medial Instep cm 22 cm Vascular Assessment Claudication: Claudication Assessment [Right:None] Pulses: Dorsalis Pedis Palpable: [Right:Yes] Posterior Tibial Extremity colors, hair growth, and conditions: Extremity Color: [Right:Normal] Hair Growth on Extremity: [Right:Yes] Temperature of Extremity: [Right:Warm] Capillary Refill: [Right:< 3 seconds] Toe Nail Assessment Left: Right: Thick: Yes Discolored: Yes Deformed: Yes Improper Length and Hygiene: Yes Electronic Signature(s) Signed: 02/15/2018 4:06:29 PM By: Roger Shelter Entered By: Roger Shelter on 02/15/2018 14:10:06 [name] ([id number]) -------------------------------------------------------------------------------- Multi Wound  Chart Details Patient Name: Jesse Floor A. Date of Service: 02/15/2018 1:45 PM Medical Record Number: [id number] Patient Account Number: [id number] Date of Birth/Sex: 04/26/1935 (83 y.o. M) Treating RN: Cornell Barman Primary Care Ezekiel Menzer: Katheren Shams Other Clinician: Referring Kloe Oates: Raechel Ache, DAVID Treating Sarp Vernier/Extender: Tito Dine in Treatment: 2 Vital Signs Height(in): 52 Pulse(bpm): 13 Weight(lbs): 167 Blood Pressure(mmHg): 125/66 Body Mass Index(BMI): 26 Temperature(F): 97.8 Respiratory Rate 16 (breaths/min): Photos: [2:No Photos] [N/A:N/A] Wound Location: [2:Right Lower Leg - Medial] [N/A:N/A] Wounding Event: [2:Gradually Appeared] [N/A:N/A] Primary Etiology: [2:Diabetic Wound/Ulcer of the Lower Extremity] [N/A:N/A] Comorbid History: [2:Cataracts, Sleep Apnea, Arrhythmia, Congestive Heart Failure, Hypertension, Peripheral Arterial  Disease, Type II Diabetes, Osteoarthritis] [N/A:N/A] Date Acquired: [2:10/22/2017] [N/A:N/A] Weeks of Treatment: [2:2] [N/A:N/A] Wound Status: [2:Open] [N/A:N/A] Measurements L x W x D [2:1.2x1.2x0.2] [N/A:N/A] (cm) Area (cm) : [2:1.131] [N/A:N/A] Volume (cm) : [2:0.226] [N/A:N/A] % Reduction in Area: [2:63.60%] [N/A:N/A] % Reduction in Volume: [2:63.70%] [N/A:N/A] Classification: [2:Grade 2] [N/A:N/A] Exudate Amount: [2:Medium] [N/A:N/A] Exudate Type: [2:Serous] [N/A:N/A] Exudate Color: [2:amber] [N/A:N/A] Wound Margin: [2:Distinct, outline attached] [N/A:N/A] Granulation Amount: [2:Small (1-33%)] [N/A:N/A] Granulation Quality: [2:Red, Pink] [N/A:N/A] Necrotic Amount: [2:Large (67-100%)] [N/A:N/A] Necrotic Tissue: [2:Eschar, Adherent Slough] [N/A:N/A] Exposed Structures: [2:Fat Layer (Subcutaneous Tissue) Exposed: Yes Fascia: No Tendon: No Muscle: No Joint: No Bone: No] [N/A:N/A] Epithelialization: [2:None] [N/A:N/A] Debridement: Debridement - Excisional N/A N/A Pre-procedure 14:29 N/A  N/A Verification/Time Out Taken: Pain Control: Other N/A N/A Tissue Debrided: Subcutaneous, Slough N/A N/A Level: Skin/Subcutaneous Tissue N/A N/A Debridement Area (sq cm): 1.44 N/A N/A Instrument: Curette N/A N/A Bleeding: Minimum N/A N/A Hemostasis Achieved: Pressure N/A N/A Procedural Pain: 0 N/A N/A Post Procedural Pain: 0 N/A N/A Debridement Treatment Procedure was tolerated well N/A N/A Response: Post Debridement 1.2x1.2x0.3 N/A N/A Measurements L x W x D (cm) Post Debridement Volume: 0.339 N/A N/A (cm) Periwound Skin Texture: Excoriation: No N/A N/A Induration: No Callus: No Crepitus: No Rash: No Scarring: No Periwound Skin Moisture: Maceration: No N/A N/A Dry/Scaly: No Periwound Skin Color: Atrophie Blanche: No N/A N/A Cyanosis: No Ecchymosis: No Erythema: No Hemosiderin Staining: No Mottled: No Pallor: No Rubor: No Temperature: No Abnormality N/A N/A Tenderness on Palpation: Yes N/A N/A Wound Preparation: Ulcer Cleansing: Wound N/A N/A Cleanser Topical Anesthetic Applied: Other: lidocaine 4% Procedures Performed: Debridement N/A N/A Treatment Notes Wound #2 (Right, Medial Lower Leg) 1. Cleansed with: Clean wound with Normal Saline 2. Anesthetic Topical Lidocaine 4% cream to wound bed prior to debridement 4. Dressing Applied: Prisma Ag 5. Secondary Dressing Applied ABD Pad Kerlix/Conform 7. Secured with [name], [name] ([id number]) Tape Notes stretch net Electronic Signature(s) Signed: 02/15/2018 6:15:06 PM By: Linton Ham MD Entered By: Linton Ham on 02/15/2018 14:57:48 [name] ([id number]) -------------------------------------------------------------------------------- Multi-Disciplinary Care Plan Details Patient Name: Jesse Floor A. Date of Service: 02/15/2018 1:45 PM Medical Record Number: [id number] Patient Account Number: [id number] Date of Birth/Sex: [birth year] (83 y.o. M) Treating RN: Cornell Barman Primary  Care Lautaro Koral: Katheren Shams Other Clinician: Referring Maudie Shingledecker: Raechel Ache, DAVID Treating Melainie Krinsky/Extender: Tito Dine in Treatment: 2 Active Inactive ` Abuse / Safety / Falls / Self Care Management Nursing Diagnoses: Impaired physical mobility Goals: Patient will remain injury free related to falls Date Initiated: 02/01/2018 Target Resolution Date: 04/28/2018 Goal Status: Active Interventions: Assess fall risk on admission and as needed Notes: ` Orientation to the Wound Care Program Nursing Diagnoses: Knowledge deficit related to the wound healing center program Goals: Patient/caregiver will verbalize understanding of the [facility] Date Initiated: 02/01/2018 Target Resolution Date: 04/29/2018 Goal Status: Active Interventions: Provide education on orientation to the wound center Notes: ` Wound/Skin Impairment Nursing Diagnoses: Impaired tissue integrity Goals: Ulcer/skin breakdown will heal within 14 weeks Date Initiated: 02/01/2018 Target Resolution Date: 04/29/2018 Goal Status: Active Interventions: [name], [name] ([id number]) Assess patient/caregiver ability to obtain necessary supplies Assess patient/caregiver ability to perform ulcer/skin care regimen upon admission and as needed Assess ulceration(s) every visit Notes: Electronic Signature(s) Signed: 02/15/2018 5:31:05 PM By: Gretta Cool, BSN, RN, CWS, Kim RN, BSN Entered By: Gretta Cool, BSN, RN, CWS, Kim on 02/15/2018 14:29:00 [name] ([id number]) -------------------------------------------------------------------------------- Pain Assessment Details Patient Name: Jesse Floor A. Date  of Service: 02/15/2018 1:45 PM Medical Record Number: [id number] Patient Account Number: [id number] Date of Birth/Sex: 1935-01-16 (82 y.o. M) Treating RN: Roger Shelter Primary Care Shamekia Tippets: Katheren Shams Other Clinician: Referring Knute Mazzuca: Raechel Ache, DAVID Treating  Tala Eber/Extender: Tito Dine in Treatment: 2 Active Problems Location of Pain Severity and Description of Pain Patient Has Paino Yes Site Locations Rate the pain. Current Pain Level: 6 Pain Management and Medication Current Pain Management: Electronic Signature(s) Signed: 02/15/2018 4:06:29 PM By: Roger Shelter Entered By: Roger Shelter on 02/15/2018 14:06:03 [name] ([id number]) -------------------------------------------------------------------------------- Patient/Caregiver Education Details Patient Name: Jesse Floor A. Date of Service: 02/15/2018 1:45 PM Medical Record Number: [id number] Patient Account Number: [id number] Date of Birth/Gender: [birth year] (83 y.o. M) Treating RN: Cornell Barman Primary Care Physician: Katheren Shams Other Clinician: Referring Physician: Raechel Ache, DAVID Treating Physician/Extender: Tito Dine in Treatment: 2 Education Assessment Education Provided To: Patient Education Topics Provided Wound/Skin Impairment: Handouts: Caring for Your Ulcer Methods: Demonstration, Explain/Verbal Responses: State content correctly Electronic Signature(s) Signed: 02/15/2018 5:31:05 PM By: Gretta Cool, BSN, RN, CWS, Kim RN, BSN Entered By: Gretta Cool, BSN, RN, CWS, Kim on 02/15/2018 14:31:11 [name] ([id number]) -------------------------------------------------------------------------------- Wound Assessment Details Patient Name: Jesse Floor A. Date of Service: 02/15/2018 1:45 PM Medical Record Number: [id number] Patient Account Number: [id number] Date of Birth/Sex: 11-Aug-1934 (83 y.o. M) Treating RN: Roger Shelter Primary Care Liev Brockbank: Katheren Shams Other Clinician: Referring Analleli Gierke: Raechel Ache, DAVID Treating Aryel Edelen/Extender: Tito Dine in Treatment: 2 Wound Status Wound Number: 2 Primary Diabetic Wound/Ulcer of the Lower Extremity Etiology: Wound Location: Right Lower Leg -  Medial Wound Open Wounding Event: Gradually Appeared Status: Date Acquired: 10/22/2017 Comorbid Cataracts, Sleep Apnea, Arrhythmia, Congestive Weeks Of Treatment: 2 History: Heart Failure, Hypertension, Peripheral Arterial Clustered Wound: No Disease, Type II Diabetes, Osteoarthritis Photos Photo Uploaded By: Roger Shelter on 02/15/2018 16:31:36 Wound Measurements Length: (cm) 1.2 Width: (cm) 1.2 Depth: (cm) 0.2 Area: (cm) 1.131 Volume: (cm) 0.226 % Reduction in Area: 63.6% % Reduction in Volume: 63.7% Epithelialization: None Tunneling: No Undermining: No Wound Description Classification: Grade 2 Wound Margin: Distinct, outline attached Exudate Amount: Medium Exudate Type: Serous Exudate Color: amber Foul Odor After Cleansing: No Slough/Fibrino Yes Wound Bed Granulation Amount: Small (1-33%) Exposed Structure Granulation Quality: Red, Pink Fascia Exposed: No Necrotic Amount: Large (67-100%) Fat Layer (Subcutaneous Tissue) Exposed: Yes Necrotic Quality: Eschar, Adherent Slough Tendon Exposed: No Muscle Exposed: No Joint Exposed: No Bone Exposed: No Periwound Skin Texture [name], [name] ([id number]) Texture Color No Abnormalities Noted: No No Abnormalities Noted: No Callus: No Atrophie Blanche: No Crepitus: No Cyanosis: No Excoriation: No Ecchymosis: No Induration: No Erythema: No Rash: No Hemosiderin Staining: No Scarring: No Mottled: No Pallor: No Moisture Rubor: No No Abnormalities Noted: No Dry / Scaly: No Temperature / Pain Maceration: No Temperature: No Abnormality Tenderness on Palpation: Yes Wound Preparation Ulcer Cleansing: Wound Cleanser Topical Anesthetic Applied: Other: lidocaine 4%, Treatment Notes Wound #2 (Right, Medial Lower Leg) 1. Cleansed with: Clean wound with Normal Saline 2. Anesthetic Topical Lidocaine 4% cream to wound bed prior to debridement 4. Dressing Applied: Prisma Ag 5. Secondary Dressing Applied ABD  Pad Kerlix/Conform 7. Secured with Tape Notes stretch net Electronic Signature(s) Signed: 02/15/2018 4:06:29 PM By: Roger Shelter Entered By: Roger Shelter on 02/15/2018 14:08:14 [name] ([id number]) -------------------------------------------------------------------------------- Vitals Details Patient Name: Jesse Floor A. Date of Service: 02/15/2018 1:45 PM Medical Record Number: [id number] Patient Account Number: [id number] Date of Birth/Sex: 08/21/1934 (83 y.o. M) Treating RN: Roger Shelter  Primary Care Molleigh Huot: Katheren Shams Other Clinician: Referring Delray Reza: Raechel Ache, DAVID Treating Yafet Cline/Extender: Tito Dine in Treatment: 2 Vital Signs Time Taken: 14:05 Temperature (F): 97.8 Height (in): 67 Pulse (bpm): 87 Weight (lbs): 167 Respiratory Rate (breaths/min): 16 Body Mass Index (BMI): 26.2 Blood Pressure (mmHg): 125/66 Reference Range: 80 - 120 mg / dl Electronic Signature(s) Signed: 02/15/2018 4:06:29 PM By: Roger Shelter Entered By: Roger Shelter on 02/15/2018 14:06:42

## 2018-02-21 DIAGNOSIS — E063 Autoimmune thyroiditis: Secondary | ICD-10-CM | POA: Insufficient documentation

## 2018-02-21 DIAGNOSIS — E785 Hyperlipidemia, unspecified: Secondary | ICD-10-CM | POA: Insufficient documentation

## 2018-02-22 ENCOUNTER — Encounter: Payer: Medicare Other | Attending: Internal Medicine | Admitting: Internal Medicine

## 2018-02-22 DIAGNOSIS — I10 Essential (primary) hypertension: Secondary | ICD-10-CM | POA: Diagnosis not present

## 2018-02-22 DIAGNOSIS — E11622 Type 2 diabetes mellitus with other skin ulcer: Secondary | ICD-10-CM | POA: Insufficient documentation

## 2018-02-22 DIAGNOSIS — I252 Old myocardial infarction: Secondary | ICD-10-CM | POA: Insufficient documentation

## 2018-02-22 DIAGNOSIS — Z7901 Long term (current) use of anticoagulants: Secondary | ICD-10-CM | POA: Diagnosis not present

## 2018-02-22 DIAGNOSIS — L97812 Non-pressure chronic ulcer of other part of right lower leg with fat layer exposed: Secondary | ICD-10-CM | POA: Diagnosis not present

## 2018-02-22 DIAGNOSIS — E1151 Type 2 diabetes mellitus with diabetic peripheral angiopathy without gangrene: Secondary | ICD-10-CM | POA: Diagnosis not present

## 2018-02-22 DIAGNOSIS — I4891 Unspecified atrial fibrillation: Secondary | ICD-10-CM | POA: Diagnosis not present

## 2018-02-22 DIAGNOSIS — I2510 Atherosclerotic heart disease of native coronary artery without angina pectoris: Secondary | ICD-10-CM | POA: Insufficient documentation

## 2018-02-22 DIAGNOSIS — Z89512 Acquired absence of left leg below knee: Secondary | ICD-10-CM | POA: Diagnosis not present

## 2018-02-25 NOTE — Progress Notes (Signed)
JOREL, GRAVLIN ([id number]) Visit Report for 02/22/2018 Arrival Information Details Patient Name: [name], [name] Date of Service: 02/22/2018 3:45 PM Medical Record Number: [id number] Patient Account Number: [id number] Date of Birth/Sex: April 22, 1935 (83 y.o. M) Treating RN: Cornell Barman Primary Care Akosua Constantine: Katheren Shams Other Clinician: Referring Amarii Bordas: Katheren Shams Treating Dearl Rudden/Extender: Tito Dine in Treatment: 3 Visit Information History Since Last Visit Added or deleted any medications: No Patient Arrived: Cane Any new allergies or adverse reactions: No Arrival Time: 15:51 Had a fall or experienced change in No Accompanied By: wife activities of daily living that may affect Transfer Assistance: None risk of falls: Patient Identification Verified: Yes Signs or symptoms of abuse/neglect since last visito No Secondary Verification Process Yes Hospitalized since last visit: No Completed: Implantable device outside of the clinic excluding No Patient Has Alerts: Yes cellular tissue based products placed in the center Patient Alerts: 01-27-18 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 Date of Service: 02/22/2018 3:45 PM Medical Record Number: [id number] Patient Account Number: [id number] Date of Birth/Sex: Apr 13, 1935 (83 y.o. M) Treating RN: Cornell Barman Primary Care Garnell Begeman: Katheren Shams Other Clinician: Referring Pietro Bonura: Katheren Shams Treating Tabria Steines/Extender: Tito Dine in  Treatment: 3 Clinic Level of Care Assessment Items TOOL 4 Quantity Score []  - Use when only an EandM is performed on FOLLOW-UP visit 0 ASSESSMENTS - Nursing Assessment / Reassessment []  - Reassessment of Co-morbidities (includes updates in patient status) 0 X- 1 5 Reassessment of Adherence to Treatment Plan ASSESSMENTS - Wound and Skin Assessment / Reassessment X - Simple Wound Assessment / Reassessment - one wound 1 5 []  - 0 Complex Wound Assessment / Reassessment - multiple wounds []  - 0 Dermatologic / Skin Assessment (not related to wound area) ASSESSMENTS - Focused Assessment []  - Circumferential Edema Measurements - multi extremities 0 []  - 0 Nutritional Assessment / Counseling / Intervention []  - 0 Lower Extremity Assessment (monofilament, tuning fork, pulses) []  - 0 Peripheral Arterial Disease Assessment (using hand held doppler) ASSESSMENTS - Ostomy and/or Continence Assessment and Care []  - Incontinence Assessment and Management 0 []  - 0 Ostomy Care Assessment and Management (repouching, etc.) PROCESS - Coordination of Care X - Simple Patient / Family Education for ongoing care 1 15 []  - 0 Complex (extensive) Patient / Family Education for ongoing care []  - 0 Staff obtains Programmer, systems, Records, Test Results / Process Orders []  - 0 Staff telephones HHA, Nursing Homes / Clarify orders / etc []  - 0 Routine Transfer to another Facility (non-emergent condition) []  - 0 Routine Hospital Admission (non-emergent condition) []  - 0 New Admissions / Biomedical engineer / Ordering NPWT, Apligraf, etc. []  - 0 Emergency Hospital Admission (emergent condition) X- 1 10 Simple Discharge Coordination [name], [name] ([id number]) []  - 0 Complex (extensive) Discharge Coordination PROCESS - Special Needs []  - Pediatric / Minor Patient Management 0 []  - 0 Isolation Patient Management []  - 0 Hearing / Language / Visual special needs []  - 0 Assessment of Community assistance  (transportation, D/C planning, etc.) []  - 0 Additional assistance / Altered mentation []  - 0 Support Surface(s) Assessment (bed, cushion, seat, etc.) INTERVENTIONS - Wound Cleansing / Measurement X - Simple Wound Cleansing -  one wound 1 5 []  - 0 Complex Wound Cleansing - multiple wounds X- 1 5 Wound Imaging (photographs - any number of wounds) []  - 0 Wound Tracing (instead of photographs) X- 1 5 Simple Wound Measurement - one wound []  - 0 Complex Wound Measurement - multiple wounds INTERVENTIONS - Wound Dressings []  - Small Wound Dressing one or multiple wounds 0 X- 1 15 Medium Wound Dressing one or multiple wounds []  - 0 Large Wound Dressing one or multiple wounds []  - 0 Application of Medications - topical []  - 0 Application of Medications - injection INTERVENTIONS - Miscellaneous []  - External ear exam 0 []  - 0 Specimen Collection (cultures, biopsies, blood, body fluids, etc.) []  - 0 Specimen(s) / Culture(s) sent or taken to Lab for analysis []  - 0 Patient Transfer (multiple staff / Civil Service fast streamer / Similar devices) []  - 0 Simple Staple / Suture removal (25 or less) []  - 0 Complex Staple / Suture removal (26 or more) []  - 0 Hypo / Hyperglycemic Management (close monitor of Blood Glucose) []  - 0 Ankle / Brachial Index (ABI) - do not check if billed separately X- 1 5 Vital Signs [name], [name] ([id number]) Has the patient been seen at the hospital within the last three years: Yes Total Score: 70 Level Of Care: New/Established - Level 2 Electronic Signature(s) Signed: 02/23/2018 3:05:38 PM By: Gretta Cool, BSN, RN, CWS, Kim RN, BSN Entered By: Gretta Cool, BSN, RN, CWS, Kim on 02/22/2018 17:21:51 [name] ([id number]) -------------------------------------------------------------------------------- Encounter Discharge Information Details Patient Name: Jesse Floor A. Date of Service: 02/22/2018 3:45 PM Medical Record Number: [id number] Patient Account Number:  [id number] Date of Birth/Sex: 06-27-1934 (83 y.o. M) Treating RN: Cornell Barman Primary Care Cayetano Mikita: Katheren Shams Other Clinician: Referring Oak Dorey: Katheren Shams Treating Shyne Lehrke/Extender: Tito Dine in Treatment: 3 Encounter Discharge Information Items Discharge Condition: Stable Ambulatory Status: Ambulatory Discharge Destination: Home Transportation: Private Auto Accompanied By: wife Schedule Follow-up Appointment: Yes Clinical Summary of Care: Electronic Signature(s) Signed: 02/22/2018 5:23:17 PM By: Gretta Cool, BSN, RN, CWS, Kim RN, BSN Entered By: Gretta Cool, BSN, RN, CWS, Kim on 02/22/2018 17:23:17 [name] ([id number]) -------------------------------------------------------------------------------- Lower Extremity Assessment Details Patient Name: Jesse Floor A. Date of Service: 02/22/2018 3:45 PM Medical Record Number: [id number] Patient Account Number: [id number] Date of Birth/Sex: 04/11/1935 (83 y.o. M) Treating RN: Secundino Ginger Primary Care Pritesh Sobecki: Katheren Shams Other Clinician: Referring Nyana Haren: Katheren Shams Treating Anny Sayler/Extender: Tito Dine in Treatment: 3 Electronic Signature(s) Signed: 02/22/2018 4:28:11 PM By: Secundino Ginger Entered By: Secundino Ginger on 02/22/2018 16:17:06 [name], [name] ([id number]) -------------------------------------------------------------------------------- Multi Wound Chart Details Patient Name: Jesse Floor A. Date of Service: 02/22/2018 3:45 PM Medical Record Number: [id number] Patient Account Number: [id number] Date of Birth/Sex: 1935-02-19 (83 y.o. M) Treating RN: Cornell Barman Primary Care Daryn Hicks: Katheren Shams Other Clinician: Referring Shann Merrick: Katheren Shams Treating Mohamedamin Nifong/Extender: Tito Dine in Treatment: 3 Vital Signs Height(in): 67 Pulse(bpm): 85 Weight(lbs): 167 Blood Pressure(mmHg): 108/58 Body Mass Index(BMI):  26 Temperature(F): 97.6 Respiratory Rate 16 (breaths/min): Photos: [N/A:N/A] Wound Location: Right Lower Leg - Medial N/A N/A Wounding Event: Gradually Appeared N/A N/A Primary Etiology: Diabetic Wound/Ulcer of the N/A N/A Lower Extremity Comorbid History: Cataracts, Sleep Apnea, N/A N/A Arrhythmia, Congestive Heart Failure, Hypertension, Peripheral Arterial Disease, Type II Diabetes, Osteoarthritis Date Acquired: 10/22/2017 N/A N/A Weeks of Treatment: 3 N/A N/A Wound Status: Open N/A N/A Measurements L x W x D 1.2x0.9x0.2 N/A N/A (cm) Area (cm) : 0.848 N/A N/A Volume (cm) :  0.17 N/A N/A % Reduction in Area: 72.70% N/A N/A % Reduction in Volume: 72.70% N/A N/A Classification: Grade 2 N/A N/A Exudate Amount: Small N/A N/A Exudate Type: Serous N/A N/A Exudate Color: amber N/A N/A Wound Margin: Distinct, outline attached N/A N/A Granulation Amount: Small (1-33%) N/A N/A Granulation Quality: Red, Pink N/A N/A Necrotic Amount: Large (67-100%) N/A N/A Necrotic Tissue: Eschar, Adherent Slough N/A N/A Exposed Structures: N/A N/A [name], [name] ([id number]) Fat Layer (Subcutaneous Tissue) Exposed: Yes Fascia: No Tendon: No Muscle: No Joint: No Bone: No Epithelialization: None N/A N/A Periwound Skin Texture: Excoriation: No N/A N/A Induration: No Callus: No Crepitus: No Rash: No Scarring: No Periwound Skin Moisture: Maceration: No N/A N/A Dry/Scaly: No Periwound Skin Color: Atrophie Blanche: No N/A N/A Cyanosis: No Ecchymosis: No Erythema: No Hemosiderin Staining: No Mottled: No Pallor: No Rubor: No Temperature: No Abnormality N/A N/A Tenderness on Palpation: Yes N/A N/A Wound Preparation: Ulcer Cleansing: Wound N/A N/A Cleanser Topical Anesthetic Applied: Other: lidocaine 4% Treatment Notes Wound #2 (Right, Medial Lower Leg) 4. Dressing Applied: Prisma Ag 5. Secondary Dressing Applied ABD and Kerlix/Conform 7. Secured with Architect) Signed: 02/23/2018 5:56:52 AM By: Linton Ham MD Entered By: Linton Ham on 02/22/2018 18:11:55 [name] ([id number]) -------------------------------------------------------------------------------- Multi-Disciplinary Care Plan Details Patient Name: Jesse Floor A. Date of Service: 02/22/2018 3:45 PM Medical Record Number: [id number] Patient Account Number: [id number] Date of Birth/Sex: January 17, 1935 (83 y.o. M) Treating RN: Cornell Barman Primary Care Jaliyah Fotheringham: Katheren Shams Other Clinician: Referring Oliviagrace Crisanti: Katheren Shams Treating Seferino Oscar/Extender: Tito Dine in Treatment: 3 Active Inactive ` Abuse / Safety / Falls / Self Care Management Nursing Diagnoses: Impaired physical mobility Goals: Patient will remain injury free related to falls Date Initiated: 02/01/2018 Target Resolution Date: 04/28/2018 Goal Status: Active Interventions: Assess fall risk on admission and as needed Notes: ` Orientation to the Wound Care Program Nursing Diagnoses: Knowledge deficit related to the wound healing center program Goals: Patient/caregiver will verbalize understanding of the [place] Program Date Initiated: 02/01/2018 Target Resolution Date: 04/29/2018 Goal Status: Active Interventions: Provide education on orientation to the wound center Notes: ` Wound/Skin Impairment Nursing Diagnoses: Impaired tissue integrity Goals: Ulcer/skin breakdown will heal within 14 weeks Date Initiated: 02/01/2018 Target Resolution Date: 04/29/2018 Goal Status: Active Interventions: [name], [name] ([id number]) Assess patient/caregiver ability to obtain necessary supplies Assess patient/caregiver ability to perform ulcer/skin care regimen upon admission and as needed Assess ulceration(s) every visit Notes: Electronic Signature(s) Signed: 02/23/2018 3:05:38 PM By: Gretta Cool, BSN, RN, CWS, Kim RN, BSN Entered By: Gretta Cool, BSN, RN, CWS, Kim  on 02/22/2018 16:49:07 [name] ([id number]) -------------------------------------------------------------------------------- Pain Assessment Details Patient Name: Jesse Floor A. Date of Service: 02/22/2018 3:45 PM Medical Record Number: [id number] Patient Account Number: [id number] Date of Birth/Sex: 11-11-1934 (83 y.o. M) Treating RN: Cornell Barman Primary Care Hajer Dwyer: Katheren Shams Other Clinician: Referring Braleigh Massoud: Katheren Shams Treating Jalynn Waddell/Extender: Tito Dine in Treatment: 3 Active Problems Location of Pain Severity and Description of Pain Patient Has Paino Yes Site Locations Rate the pain. Current Pain Level: 3 Pain Management and Medication Current Pain Management: Electronic Signature(s) Signed: 02/22/2018 4:32:30 PM By: Lorine Bears RCP, RRT, CHT Signed: 02/23/2018 3:05:38 PM By: Gretta Cool, BSN, RN, CWS, Kim RN, BSN Entered By: Lorine Bears on 02/22/2018 15:55:44 [name] ([id number]) -------------------------------------------------------------------------------- Patient/Caregiver Education Details Patient Name: Jesse Floor A. Date of Service: 02/22/2018 3:45 PM Medical Record Number: [id number] Patient Account Number: [id number] Date of Birth/Gender: 1934-06-04 (  83 y.o. M) Treating RN: Cornell Barman Primary Care Physician: Katheren Shams Other Clinician: Referring Physician: Katheren Shams Treating Physician/Extender: Tito Dine in Treatment: 3 Education Assessment Education Provided To: Patient Education Topics Provided Wound/Skin Impairment: Handouts: Caring for Your Ulcer Methods: Demonstration, Explain/Verbal Responses: State content correctly Electronic Signature(s) Signed: 02/23/2018 3:05:38 PM By: Gretta Cool, BSN, RN, CWS, Kim RN, BSN Entered By: Gretta Cool, BSN, RN, CWS, Kim on 02/22/2018 17:22:15 [name]  ([id number]) -------------------------------------------------------------------------------- Wound Assessment Details Patient Name: Jesse Floor A. Date of Service: 02/22/2018 3:45 PM Medical Record Number: [id number] Patient Account Number: [id number] Date of Birth/Sex: Aug 30, 1934 (83 y.o. M) Treating RN: Secundino Ginger Primary Care Dandrea Medders: Katheren Shams Other Clinician: Referring Lockie Bothun: Katheren Shams Treating Serina Nichter/Extender: Tito Dine in Treatment: 3 Wound Status Wound Number: 2 Primary Diabetic Wound/Ulcer of the Lower Extremity Etiology: Wound Location: Right Lower Leg - Medial Wound Open Wounding Event: Gradually Appeared Status: Date Acquired: 10/22/2017 Comorbid Cataracts, Sleep Apnea, Arrhythmia, Congestive Weeks Of Treatment: 3 History: Heart Failure, Hypertension, Peripheral Arterial Clustered Wound: No Disease, Type II Diabetes, Osteoarthritis Photos Photo Uploaded By: Secundino Ginger on 02/22/2018 16:25:55 Wound Measurements Length: (cm) 1.2 % Reduction Width: (cm) 0.9 % Reduction Depth: (cm) 0.2 Epitheliali Area: (cm) 0.848 Tunneling: Volume: (cm) 0.17 Underminin in Area: 72.7% in Volume: 72.7% zation: None No g: No Wound Description Classification: Grade 2 Foul Odor A Wound Margin: Distinct, outline attached Slough/Fibr Exudate Amount: Small Exudate Type: Serous Exudate Color: amber fter Cleansing: No ino Yes Wound Bed Granulation Amount: Small (1-33%) Exposed Structure Granulation Quality: Red, Pink Fascia Exposed: No Necrotic Amount: Large (67-100%) Fat Layer (Subcutaneous Tissue) Exposed: Yes Necrotic Quality: Eschar, Adherent Slough Tendon Exposed: No Muscle Exposed: No Joint Exposed: No Bone Exposed: No Periwound Skin Texture [name], [name] ([id number]) Texture Color No Abnormalities Noted: No No Abnormalities Noted: No Callus: No Atrophie Blanche: No Crepitus: No Cyanosis: No Excoriation:  No Ecchymosis: No Induration: No Erythema: No Rash: No Hemosiderin Staining: No Scarring: No Mottled: No Pallor: No Moisture Rubor: No No Abnormalities Noted: No Dry / Scaly: No Temperature / Pain Maceration: No Temperature: No Abnormality Tenderness on Palpation: Yes Wound Preparation Ulcer Cleansing: Wound Cleanser Topical Anesthetic Applied: Other: lidocaine 4%, Treatment Notes Wound #2 (Right, Medial Lower Leg) 4. Dressing Applied: Prisma Ag 5. Secondary Dressing Applied ABD and Kerlix/Conform 7. Secured with Recruitment consultant) Signed: 02/22/2018 4:28:11 PM By: Secundino Ginger Entered By: Secundino Ginger on 02/22/2018 16:20:55 [name] ([id number]) -------------------------------------------------------------------------------- Vitals Details Patient Name: Jesse Floor A. Date of Service: 02/22/2018 3:45 PM Medical Record Number: [id number] Patient Account Number: [id number] Date of Birth/Sex: 1934-07-09 (83 y.o. M) Treating RN: Cornell Barman Primary Care Liora Myles: Katheren Shams Other Clinician: Referring Akina Maish: Katheren Shams Treating Keyanni Whittinghill/Extender: Tito Dine in Treatment: 3 Vital Signs Time Taken: 15:55 Temperature (F): 97.6 Height (in): 67 Pulse (bpm): 85 Weight (lbs): 167 Respiratory Rate (breaths/min): 16 Body Mass Index (BMI): 26.2 Blood Pressure (mmHg): 108/58 Reference Range: 80 - 120 mg / dl Electronic Signature(s) Signed: 02/22/2018 4:32:30 PM By: Lorine Bears RCP, RRT, CHT Entered By: Lorine Bears on 02/22/2018 15:58:22

## 2018-02-25 NOTE — Progress Notes (Signed)
[name], [name] ([id number]) Visit Report for 02/22/2018 HPI Details Patient Name: [name], [name]. Date of Service: 02/22/2018 3:45 PM Medical Record Number: [id number] Patient Account Number: [id number] Date of Birth/Sex: 1935/03/26 (83 y.o. M) Treating RN: Cornell Barman Primary Care Provider: Katheren Shams Other Clinician: Referring Provider: Katheren Shams Treating Provider/Extender: Tito Dine in Treatment: 3 History of Present Illness Location: Left foot surgical site in the distal/lateral region Quality: Patient describes the pain [name] sore/burning sensation Severity: 3/10 at rest and 8/10 with manipulation Duration: Patientos surgery was on 10/15/16 with Dr. Albertine Patricia DPM Context: Patient had an amputation of the left 3rd - 5th toes on 10/15/16 and the surgical site has not healed following Modifying Factors: Currently this has been packed with saline soaked gauze in [name] wet to dry format Associated Signs and Symptoms: HTN, DM, [name]-fib, chronic Coumadin use HPI Description: 11/29/16 patient presents today for evaluation concerning [name] nonhealing surgical wound following amputation of his left third through fifth toes which was performed on 10/15/16. Unfortunately following this amputation the circle side has not closed appropriately and he continues to have [name] significant amount of discomfort. He did have an x-ray performed on 10/21/16 which showed him being status post D3 toe amputation but otherwise no acute findings. Patient also has venous imaging of the left lower extremity which revealed no evidence of DVT on 10/22/16. His last hemoglobin [name] 1C was 8.5 and this was on 10/22/16 and prior to his amputation patient did have vascular surgery with Dr. Leotis Pain where he had [name] catheter placed in the left anterior tibialis/dorsalis pedis artery from the right femoral approach. Percutaneous transluminal angioplasty of the left anterior tibial artery/dorsalis pedis artery  was performed. This procedure apparently went very well. Unfortunately his healing just has not been what both Dr. Elvina Mattes nor himself were expecting or wanting. The question posed to [place] today is whether hyperbaric therapy would be of benefit for him. READMISSION 02/01/18 This is now [name] 83 year old man we had in the clinic over 2 years ago. At that point he was here for evaluation of amputations of several of his toes on his left foot with [name] nonhealing surgical wound. He was only here for 1 visit. The patient tells me he went on to have [name] left below-knee amputation slightly over [name] year ago. Sometime earlier this year perhaps in April he traumatized his right shin and he has had an open wound here since then. He is already seen Dr. Lucky Cowboy and vein and vascular. He had noninvasive tests that showed an ABI on the right of 1.23 but [name] TBI in the right of only 0.27. He had monophasic waveforms at the anterior tibial and biphasic at the posterior tibial. The overall interpretation was that although his resting right ankle brachial index is within normal limits this may be falsely elevated. Furthermore his toe brachial index was quite [name] bit reduced. According to the last note from vein and vascular on 01/27/18 they discussed angiogram with possible revascularization on the right. He was felt to be reasonably high risk because of his and inability inability to stay still requiring general anesthesia therefore. They told him to think about it and get back to them. He was referred here for review of this wound. As I understand things they've been using Xeroform I'm not clear what they've been putting on this although they did talk about "an ointment". 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 During his admission  to hospital in July MRSA was apparently cultured from this wound and he received [name] course of Doxy. As he has had recent noninvasive arterial studies done on 01/27/18,no ABIs were felt to be necessary here. 02/08/18; patient arrives in with [name] better looking wound. Dimensions are smaller. using silver collagen 02/15/18; wound is measuring smaller. Still with adherent surface material requiring debridement. We've been using silver collagen 02/22/18; wound is measuring smaller. No debridement is required. We're using silver collagen under compression. [name], [name] ([id number]) Electronic Signature(s) Signed: 02/23/2018 5:56:52 AM By: Linton Ham MD Entered By: Linton Ham on 02/22/2018 18:12:28 [name] ([id number]) -------------------------------------------------------------------------------- Physical Exam Details Patient Name: Jesse Floor [name]. Date of Service: 02/22/2018 3:45 PM Medical Record Number: [id number] Patient Account Number: [id number] Date of Birth/Sex: 1935-03-08 (83 y.o. M) Treating RN: Cornell Barman Primary Care Provider: Katheren Shams Other Clinician: Referring Provider: Katheren Shams Treating Provider/Extender: Tito Dine in Treatment: 3 Constitutional Sitting or standing Blood Pressure is within target range for patient.. Pulse regular and within target range for patient.Marland Kitchen Respirations regular, non-labored and within target range.. Temperature is normal and within the target range for the patient.Marland Kitchen appears in no distress. Notes When exam; the area on the right anterior tibia. No debridement is required. The wound surface looks healthy. There is still depth in the medial part of this although the entire wound looks satisfactory. Electronic Signature(s) Signed: 02/23/2018 5:56:52 AM By: Linton Ham MD Entered By: Linton Ham on 02/22/2018 18:13:09 [name]  ([id number]) -------------------------------------------------------------------------------- Physician Orders Details Patient Name: Jesse Floor [name]. Date of Service: 02/22/2018 3:45 PM Medical Record Number: [id number] Patient Account Number: [id number] Date of Birth/Sex: Aug 19, 1934 (83 y.o. M) Treating RN: Cornell Barman Primary Care Provider: Katheren Shams Other Clinician: Referring Provider: Katheren Shams Treating Provider/Extender: Tito Dine in Treatment: 3 Verbal / Phone Orders: No Diagnosis Coding Wound Cleansing Wound #2 Right,Medial Lower Leg o May Shower, gently pat wound dry prior to applying new dressing. Anesthetic (add to Medication List) Wound #2 Right,Medial Lower Leg o Topical Lidocaine 4% cream applied to wound bed prior to debridement (In Clinic Only). Primary Wound Dressing Wound #2 Right,Medial Lower Leg o Hydrogel - or [place] Jelly to moisten silver collagen o Silver Collagen - moistened with hydrogel or KY jelly Secondary Dressing Wound #2 Right,Medial Lower Leg o ABD and Kerlix/Conform Dressing Change Frequency Wound #2 Right,Medial Lower Leg o Change Dressing Monday, Wednesday, Friday Follow-up Appointments Wound #2 Right,Medial Lower Leg o Return Appointment in 1 week. Edema Control Wound #2 Right,Medial Lower Leg o Elevate legs to the level of the heart and pump ankles as often as possible Electronic Signature(s) Signed: 02/23/2018 5:56:52 AM By: Linton Ham MD Signed: 02/23/2018 3:05:38 PM By: Gretta Cool, BSN, RN, CWS, Kim RN, BSN Entered By: Gretta Cool, BSN, RN, CWS, Kim on 02/22/2018 16:50:06 [name] ([id number]) -------------------------------------------------------------------------------- Problem List Details Patient Name: Jesse Floor [name]. Date of Service: 02/22/2018 3:45 PM Medical Record Number: [id number] Patient Account Number: [id number] Date of Birth/Sex: 1934-07-02 (83 y.o. M) Treating RN:  Cornell Barman Primary Care Provider: Katheren Shams Other Clinician: Referring Provider: Katheren Shams Treating Provider/Extender: Tito Dine in Treatment: 3 Active Problems ICD-10 Evaluated Encounter Code Description Active Date Today Diagnosis L97.212 Non-pressure chronic ulcer of right calf with fat layer exposed 02/01/2018  No Yes E11.622 Type 2 diabetes mellitus with other skin ulcer 02/01/2018 No Yes E11.51 Type 2 diabetes mellitus with diabetic peripheral angiopathy 02/01/2018 No Yes without gangrene Inactive Problems Resolved Problems Electronic Signature(s) Signed: 02/23/2018 5:56:52 AM By: Linton Ham MD Entered By: Linton Ham on 02/22/2018 18:11:46 [name], Jesse AMarland Kitchen ([id number]) -------------------------------------------------------------------------------- Progress Note Details Patient Name: Jesse Floor [name]. Date of Service: 02/22/2018 3:45 PM Medical Record Number: [id number] Patient Account Number: [id number] Date of Birth/Sex: 12-14-1934 (82 y.o. M) Treating RN: Cornell Barman Primary Care Provider: Katheren Shams Other Clinician: Referring Provider: Katheren Shams Treating Provider/Extender: Tito Dine in Treatment: 3 Subjective History of Present Illness (HPI) The following HPI elements were documented for the patient's wound: Location: Left foot surgical site in the distal/lateral region Quality: Patient describes the pain [name] sore/burning sensation Severity: 3/10 at rest and 8/10 with manipulation Duration: Patient s surgery was on 10/15/16 with Dr. Albertine Patricia DPM Context: Patient had an amputation of the left 3rd - 5th toes on 10/15/16 and the surgical site has not healed following Modifying Factors: Currently this has been packed with saline soaked gauze in [name] wet to dry format Associated Signs and Symptoms: HTN, DM, [name]-fib, chronic Coumadin use 11/29/16 patient presents today for evaluation concerning [name]  nonhealing surgical wound following amputation of his left third through fifth toes which was performed on 10/15/16. Unfortunately following this amputation the circle side has not closed appropriately and he continues to have [name] significant amount of discomfort. He did have an x-ray performed on 10/21/16 which showed him being status post D3 toe amputation but otherwise no acute findings. Patient also has venous imaging of the left lower extremity which revealed no evidence of DVT on 10/22/16. His last hemoglobin [name] 1C was 8.5 and this was on 10/22/16 and prior to his amputation patient did have vascular surgery with Dr. Leotis Pain where he had [name] catheter placed in the left anterior tibialis/dorsalis pedis artery from the right femoral approach. Percutaneous transluminal angioplasty of the left anterior tibial artery/dorsalis pedis artery was performed. This procedure apparently went very well. Unfortunately his healing just has not been what both Dr. Elvina Mattes nor himself were expecting or wanting. The question posed to [place] today is whether hyperbaric therapy would be of benefit for him. READMISSION 02/01/18 This is now [name] 83 year old man we had in the clinic over 2 years ago. At that point he was here for evaluation of amputations of several of his toes on his left foot with [name] nonhealing surgical wound. He was only here for 1 visit. The patient tells me he went on to have [name] left below-knee amputation slightly over [name] year ago. Sometime earlier this year perhaps in April he traumatized his right shin and he has had an open wound here since then. He is already seen Dr. Lucky Cowboy and vein and vascular. He had noninvasive tests that showed an ABI on the right of 1.23 but [name] TBI in the right of only 0.27. He had monophasic waveforms at the anterior tibial and biphasic at the posterior tibial. The overall interpretation was that although his resting right ankle brachial index is within normal limits this may be falsely  elevated. Furthermore his toe brachial index was quite [name] bit reduced. According to the last note from vein and vascular on 01/27/18 they discussed angiogram with possible revascularization on the right. He was felt to be reasonably high risk because of his and inability inability to stay still requiring general anesthesia therefore. They  told him to think about it and get back to them. He was referred here for review of this wound. As I understand things they've been using Xeroform I'm not clear what they've been putting on this although they did talk about "an ointment". 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 During his admission to hospital in July MRSA was apparently cultured from this wound and he received [name] course of Doxy. As he has had recent noninvasive arterial studies done on 01/27/18,no ABIs were felt to be necessary here. 02/08/18; patient arrives in with [name] better looking wound. Dimensions are smaller. using silver collagen 02/15/18; wound is measuring smaller. Still with adherent surface material requiring debridement. We've been using silver collagen 02/22/18; wound is measuring smaller. No debridement is required. We're using silver collagen under compression. [name], [name]. ([id number]) Objective Constitutional Sitting or standing Blood Pressure is within target range for patient.. Pulse regular and within target range for patient.Marland Kitchen Respirations regular, non-labored and within target range.. Temperature is normal and within the target range for the patient.Marland Kitchen appears in no distress. Vitals Time Taken: 3:55 PM, Height: 67 in, Weight: 167 lbs, BMI: 26.2, Temperature: 97.6 F, Pulse: 85 bpm, Respiratory Rate: 16 breaths/min, Blood Pressure: 108/58 mmHg. General Notes: When exam;  the area on the right anterior tibia. No debridement is required. The wound surface looks healthy. There is still depth in the medial part of this although the entire wound looks satisfactory. Integumentary (Hair, Skin) Wound #2 status is Open. Original cause of wound was Gradually Appeared. The wound is located on the Right,Medial Lower Leg. The wound measures 1.2cm length x 0.9cm width x 0.2cm depth; 0.848cm^2 area and 0.17cm^3 volume. There is Fat Layer (Subcutaneous Tissue) Exposed exposed. There is no tunneling or undermining noted. There is [name] small amount of serous drainage noted. The wound margin is distinct with the outline attached to the wound base. There is small (1-33%) red, pink granulation within the wound bed. There is [name] large (67-100%) amount of necrotic tissue within the wound bed including Eschar and Adherent Slough. The periwound skin appearance did not exhibit: Callus, Crepitus, Excoriation, Induration, Rash, Scarring, Dry/Scaly, Maceration, Atrophie Blanche, Cyanosis, Ecchymosis, Hemosiderin Staining, Mottled, Pallor, Rubor, Erythema. Periwound temperature was noted as No Abnormality. The periwound has tenderness on palpation. Assessment Active Problems ICD-10 Non-pressure chronic ulcer of right calf with fat layer exposed Type 2 diabetes mellitus with other skin ulcer Type 2 diabetes mellitus with diabetic peripheral angiopathy without gangrene Plan Wound Cleansing: Wound #2 Right,Medial Lower Leg: May Shower, gently pat wound dry prior to applying new dressing. Anesthetic (add to Medication List): Wound #2 Right,Medial Lower Leg: Topical Lidocaine 4% cream applied to wound bed prior to debridement (In Clinic Only). [name], [name] ([id number]) Primary Wound Dressing: Wound #2 Right,Medial Lower Leg: Hydrogel - or [place] Jelly to moisten silver collagen Silver Collagen - moistened with hydrogel or KY jelly Secondary Dressing: Wound #2 Right,Medial Lower Leg: ABD and  Kerlix/Conform Dressing Change Frequency: Wound #2 Right,Medial Lower Leg: Change Dressing Monday, Wednesday, Friday Follow-up Appointments: Wound #2 Right,Medial Lower Leg: Return Appointment in 1 week. Edema Control: Wound #2 Right,Medial Lower Leg: Elevate legs to the level of the heart and pump ankles as often as possible #1We continued with silver collagen hydrogel/ABDs Kerlix and conform Electronic  Signature(s) Signed: 02/23/2018 5:56:52 AM By: Linton Ham MD Entered By: Linton Ham on 02/22/2018 18:13:43 [place]. ([id number]) -------------------------------------------------------------------------------- SuperBill Details Patient Name: Jesse Floor [name]. Date of Service: 02/22/2018 Medical Record Number: [id number] Patient Account Number: [id number] Date of Birth/Sex: 14-Sep-1934 (83 y.o. M) Treating RN: Cornell Barman Primary Care Provider: Katheren Shams Other Clinician: Referring Provider: Katheren Shams Treating Provider/Extender: Tito Dine in Treatment: 3 Diagnosis Coding ICD-10 Codes Code Description [phone] Non-pressure chronic ulcer of right calf with fat layer exposed E11.622 Type 2 diabetes mellitus with other skin ulcer E11.51 Type 2 diabetes mellitus with diabetic peripheral angiopathy without gangrene Facility Procedures CPT4 Code: [id number] Description: [phone] - WOUND CARE VISIT-LEV 2 EST PT Modifier: Quantity: 1 Physician Procedures CPT4 Code: [id number] Description: 72182 - WC PHYS LEVEL 2 - EST PT ICD-10 Diagnosis Description L97.212 Non-pressure chronic ulcer of right calf with fat layer exp E11.622 Type 2 diabetes mellitus with other skin ulcer Modifier: osed Quantity: 1 Electronic Signature(s) Signed: 02/23/2018 5:56:52 AM By: Linton Ham MD Entered By: Linton Ham on 02/22/2018 18:13:59

## 2018-03-01 ENCOUNTER — Encounter: Payer: Medicare Other | Admitting: Internal Medicine

## 2018-03-01 DIAGNOSIS — E11622 Type 2 diabetes mellitus with other skin ulcer: Secondary | ICD-10-CM | POA: Diagnosis not present

## 2018-03-04 NOTE — Progress Notes (Signed)
TAMARCUS, CONDIE ([id number]) Visit Report for 03/01/2018 Arrival Information Details Patient Name: [name], [name] Date of Service: 03/01/2018 1:30 PM Medical Record Number: [id number] Patient Account Number: [id number] Date of Birth/Sex: [birth year] (83 y.o. M) Treating RN: Cornell Barman Primary Care Rahiem Schellinger: Katheren Shams Other Clinician: Referring Bayley Hurn: Katheren Shams Treating Omya Winfield/Extender: Tito Dine in Treatment: 4 Visit Information History Since Last Visit Added or deleted any medications: No Patient Arrived: Cane Any new allergies or adverse reactions: No Arrival Time: 13:23 Had a fall or experienced change in No Accompanied By: wife activities of daily living that may affect Transfer Assistance: None risk of falls: Patient Identification Verified: Yes Signs or symptoms of abuse/neglect since last visito No Secondary Verification Process Yes Hospitalized since last visit: No Completed: Implantable device outside of the clinic excluding No Patient Has Alerts: Yes cellular tissue based products placed in the center Patient Alerts: 01-27-18 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 Date of Service: 03/01/2018 1:30 PM Medical Record Number: [id number] Patient Account Number: [id number] Date of Birth/Sex: Mar 12, 1935 (83 y.o. M) Treating RN: Cornell Barman Primary Care Lakaisha Danish: Katheren Shams Other Clinician: Referring Jden Want: Katheren Shams Treating Monifah Freehling/Extender: Tito Dine in  Treatment: 4 Clinic Level of Care Assessment Items TOOL 4 Quantity Score []  - Use when only an EandM is performed on FOLLOW-UP visit 0 ASSESSMENTS - Nursing Assessment / Reassessment []  - Reassessment of Co-morbidities (includes updates in patient status) 0 X- 1 5 Reassessment of Adherence to Treatment Plan ASSESSMENTS - Wound and Skin Assessment / Reassessment X - Simple Wound Assessment / Reassessment - one wound 1 5 []  - 0 Complex Wound Assessment / Reassessment - multiple wounds []  - 0 Dermatologic / Skin Assessment (not related to wound area) ASSESSMENTS - Focused Assessment []  - Circumferential Edema Measurements - multi extremities 0 []  - 0 Nutritional Assessment / Counseling / Intervention []  - 0 Lower Extremity Assessment (monofilament, tuning fork, pulses) []  - 0 Peripheral Arterial Disease Assessment (using hand held doppler) ASSESSMENTS - Ostomy and/or Continence Assessment and Care []  - Incontinence Assessment and Management 0 []  - 0 Ostomy Care Assessment and Management (repouching, etc.) PROCESS - Coordination of Care X - Simple Patient / Family Education for ongoing care 1 15 []  - 0 Complex (extensive) Patient / Family Education for ongoing care X- 1 10 Staff obtains Programmer, systems, Records, Test Results / Process Orders []  - 0 Staff telephones HHA, Nursing Homes / Clarify orders / etc []  - 0 Routine Transfer to another Facility (non-emergent condition) []  - 0 Routine Hospital Admission (non-emergent condition) []  - 0 New Admissions / Biomedical engineer / Ordering NPWT, Apligraf, etc. []  - 0 Emergency Hospital Admission (emergent condition) X- 1 10 Simple Discharge Coordination Eberlein, Leeandre A. ([id number]) []  - 0 Complex (extensive) Discharge Coordination PROCESS - Special Needs []  - Pediatric / Minor Patient Management 0 []  - 0 Isolation Patient Management []  - 0 Hearing / Language / Visual special needs []  - 0 Assessment of Community  assistance (transportation, D/C planning, etc.) []  - 0 Additional assistance / Altered mentation []  - 0 Support Surface(s) Assessment (bed, cushion, seat, etc.) INTERVENTIONS - Wound Cleansing / Measurement X - Simple Wound Cleansing -  one wound 1 5 []  - 0 Complex Wound Cleansing - multiple wounds X- 1 5 Wound Imaging (photographs - any number of wounds) []  - 0 Wound Tracing (instead of photographs) []  - 0 Simple Wound Measurement - one wound []  - 0 Complex Wound Measurement - multiple wounds INTERVENTIONS - Wound Dressings []  - Small Wound Dressing one or multiple wounds 0 X- 1 15 Medium Wound Dressing one or multiple wounds []  - 0 Large Wound Dressing one or multiple wounds []  - 0 Application of Medications - topical []  - 0 Application of Medications - injection INTERVENTIONS - Miscellaneous []  - External ear exam 0 []  - 0 Specimen Collection (cultures, biopsies, blood, body fluids, etc.) []  - 0 Specimen(s) / Culture(s) sent or taken to Lab for analysis []  - 0 Patient Transfer (multiple staff / Civil Service fast streamer / Similar devices) []  - 0 Simple Staple / Suture removal (25 or less) []  - 0 Complex Staple / Suture removal (26 or more) []  - 0 Hypo / Hyperglycemic Management (close monitor of Blood Glucose) []  - 0 Ankle / Brachial Index (ABI) - do not check if billed separately X- 1 5 Vital Signs Rainbow, Prophet A. ([id number]) Has the patient been seen at the hospital within the last three years: Yes Total Score: 75 Level Of Care: New/Established - Level 2 Electronic Signature(s) Signed: 03/01/2018 5:34:51 PM By: Gretta Cool, BSN, RN, CWS, Kim RN, BSN Entered By: Gretta Cool, BSN, RN, CWS, Kim on 03/01/2018 14:03:30 [name] ([id number]) -------------------------------------------------------------------------------- Lower Extremity Assessment Details Patient Name: Jesse Floor A. Date of Service: 03/01/2018 1:30 PM Medical Record Number: [id number] Patient Account  Number: [id number] Date of Birth/Sex: April 16, 1935 (83 y.o. M) Treating RN: Secundino Ginger Primary Care Nicky Milhouse: Katheren Shams Other Clinician: Referring Yosef Krogh: Katheren Shams Treating Rayvion Stumph/Extender: Ricard Dillon Weeks in Treatment: 4 Electronic Signature(s) Signed: 03/01/2018 2:30:40 PM By: Secundino Ginger Entered By: Secundino Ginger on 03/01/2018 13:29:36 Mantell, [name] ([id number]) -------------------------------------------------------------------------------- Multi Wound Chart Details Patient Name: Jesse Floor A. Date of Service: 03/01/2018 1:30 PM Medical Record Number: [id number] Patient Account Number: [id number] Date of Birth/Sex: December 06, 1934 (83 y.o. M) Treating RN: Cornell Barman Primary Care Adriana Quinby: Katheren Shams Other Clinician: Referring Coraima Tibbs: Katheren Shams Treating Joceline Hinchcliff/Extender: Tito Dine in Treatment: 4 Vital Signs Height(in): 67 Pulse(bpm): 74 Weight(lbs): 167 Blood Pressure(mmHg): 118/61 Body Mass Index(BMI): 26 Temperature(F): 98.2 Respiratory Rate 16 (breaths/min): Photos: [N/A:N/A] Wound Location: Right Lower Leg - Medial N/A N/A Wounding Event: Gradually Appeared N/A N/A Primary Etiology: Diabetic Wound/Ulcer of the N/A N/A Lower Extremity Comorbid History: Cataracts, Sleep Apnea, N/A N/A Arrhythmia, Congestive Heart Failure, Hypertension, Peripheral Arterial Disease, Type II Diabetes, Osteoarthritis Date Acquired: 10/22/2017 N/A N/A Weeks of Treatment: 4 N/A N/A Wound Status: Open N/A N/A Measurements L x W x D 0.8x0.7x0.2 N/A N/A (cm) Area (cm) : 0.44 N/A N/A Volume (cm) : 0.088 N/A N/A % Reduction in Area: 85.90% N/A N/A % Reduction in Volume: 85.90% N/A N/A Classification: Grade 2 N/A N/A Exudate Amount: Small N/A N/A Exudate Type: Serous N/A N/A Exudate Color: amber N/A N/A Wound Margin: Distinct, outline attached N/A N/A Granulation Amount: Small (1-33%) N/A N/A Granulation  Quality: Red, Pink N/A N/A Necrotic Amount: Large (67-100%) N/A N/A Necrotic Tissue: Eschar, Adherent Slough N/A N/A Exposed Structures: N/A N/A SHARONE, ALMOND A. ([id number]) Fat Layer (Subcutaneous Tissue) Exposed: Yes Fascia: No Tendon: No Muscle: No Joint: No Bone: No Epithelialization: None N/A N/A Periwound Skin Texture: Excoriation: No N/A N/A Induration: No Callus: No Crepitus: No  Rash: No Scarring: No Periwound Skin Moisture: Maceration: No N/A N/A Dry/Scaly: No Periwound Skin Color: Atrophie Blanche: No N/A N/A Cyanosis: No Ecchymosis: No Erythema: No Hemosiderin Staining: No Mottled: No Pallor: No Rubor: No Temperature: No Abnormality N/A N/A Tenderness on Palpation: Yes N/A N/A Wound Preparation: Ulcer Cleansing: Wound N/A N/A Cleanser Topical Anesthetic Applied: Other: lidocaine 4% Treatment Notes Electronic Signature(s) Signed: 03/01/2018 6:14:59 PM By: Linton Ham MD Entered By: Linton Ham on 03/01/2018 17:00:31 [name] ([id number]) -------------------------------------------------------------------------------- Multi-Disciplinary Care Plan Details Patient Name: Jesse Floor A. Date of Service: 03/01/2018 1:30 PM Medical Record Number: [id number] Patient Account Number: [id number] Date of Birth/Sex: Jan 14, 1935 (83 y.o. M) Treating RN: Cornell Barman Primary Care Korbin Notaro: Katheren Shams Other Clinician: Referring Cyndi Montejano: Katheren Shams Treating Glendia Olshefski/Extender: Tito Dine in Treatment: 4 Active Inactive ` Abuse / Safety / Falls / Self Care Management Nursing Diagnoses: Impaired physical mobility Goals: Patient will remain injury free related to falls Date Initiated: 02/01/2018 Target Resolution Date: 04/28/2018 Goal Status: Active Interventions: Assess fall risk on admission and as needed Notes: ` Orientation to the Wound Care Program Nursing Diagnoses: Knowledge deficit related to the  wound healing center program Goals: Patient/caregiver will verbalize understanding of the [facility] Program Date Initiated: 02/01/2018 Target Resolution Date: 04/29/2018 Goal Status: Active Interventions: Provide education on orientation to the wound center Notes: ` Wound/Skin Impairment Nursing Diagnoses: Impaired tissue integrity Goals: Ulcer/skin breakdown will heal within 14 weeks Date Initiated: 02/01/2018 Target Resolution Date: 04/29/2018 Goal Status: Active Interventions: KHAMRON, [name] ([id number]) Assess patient/caregiver ability to obtain necessary supplies Assess patient/caregiver ability to perform ulcer/skin care regimen upon admission and as needed Assess ulceration(s) every visit Notes: Electronic Signature(s) Signed: 03/01/2018 5:34:51 PM By: Gretta Cool, BSN, RN, CWS, Kim RN, BSN Entered By: Gretta Cool, BSN, RN, CWS, Kim on 03/01/2018 14:01:49 [name] ([id number]) -------------------------------------------------------------------------------- Pain Assessment Details Patient Name: Jesse Floor A. Date of Service: 03/01/2018 1:30 PM Medical Record Number: [id number] Patient Account Number: [id number] Date of Birth/Sex: July 21, 1934 (83 y.o. M) Treating RN: Cornell Barman Primary Care Amonie Wisser: Katheren Shams Other Clinician: Referring Amdrew Oboyle: Katheren Shams Treating Saloma Cadena/Extender: Tito Dine in Treatment: 4 Active Problems Location of Pain Severity and Description of Pain Patient Has Paino No Site Locations Pain Management and Medication Current Pain Management: Electronic Signature(s) Signed: 03/01/2018 4:51:34 PM By: Lorine Bears RCP, RRT, CHT Signed: 03/01/2018 5:34:51 PM By: Gretta Cool, BSN, RN, CWS, Kim RN, BSN Entered By: Lorine Bears on 03/01/2018 13:23:53 [name]  ([id number]) -------------------------------------------------------------------------------- Patient/Caregiver Education Details Patient Name: Jesse Floor A. Date of Service: 03/01/2018 1:30 PM Medical Record Number: [id number] Patient Account Number: [id number] Date of Birth/Gender: 10/27/1934 (83 y.o. M) Treating RN: Cornell Barman Primary Care Physician: Katheren Shams Other Clinician: Referring Physician: Katheren Shams Treating Physician/Extender: Tito Dine in Treatment: 4 Education Assessment Education Provided To: Patient Education Topics Provided Wound/Skin Impairment: Handouts: Caring for Your Ulcer Methods: Demonstration, Explain/Verbal Responses: State content correctly Electronic Signature(s) Signed: 03/01/2018 5:34:51 PM By: Gretta Cool, BSN, RN, CWS, Kim RN, BSN Entered By: Gretta Cool, BSN, RN, CWS, Kim on 03/01/2018 14:03:46 [name] ([id number]) -------------------------------------------------------------------------------- Wound Assessment Details Patient Name: Jesse Floor A. Date of Service: 03/01/2018 1:30 PM Medical Record Number: [id number] Patient Account Number: [id number] Date of Birth/Sex: 02-May-1935 (83 y.o. M) Treating RN: Secundino Ginger Primary Care Ziomara Birenbaum: Katheren Shams Other Clinician: Referring Shaleah Nissley: Katheren Shams Treating Yue Flanigan/Extender: Tito Dine in Treatment: 4 Wound Status Wound Number: 2 Primary Diabetic  Wound/Ulcer of the Lower Extremity Etiology: Wound Location: Right Lower Leg - Medial Wound Open Wounding Event: Gradually Appeared Status: Date Acquired: 10/22/2017 Comorbid Cataracts, Sleep Apnea, Arrhythmia, Congestive Weeks Of Treatment: 4 History: Heart Failure, Hypertension, Peripheral Arterial Clustered Wound: No Disease, Type II Diabetes, Osteoarthritis Photos Photo Uploaded By: Secundino Ginger on 03/01/2018 13:37:05 Wound Measurements Length: (cm) 0.8 % Reduction Width:  (cm) 0.7 % Reduction Depth: (cm) 0.2 Epitheliali Area: (cm) 0.44 Tunneling: Volume: (cm) 0.088 Underminin in Area: 85.9% in Volume: 85.9% zation: None No g: No Wound Description Classification: Grade 2 Foul Odor A Wound Margin: Distinct, outline attached Slough/Fibr Exudate Amount: Small Exudate Type: Serous Exudate Color: amber fter Cleansing: No ino Yes Wound Bed Granulation Amount: Small (1-33%) Exposed Structure Granulation Quality: Red, Pink Fascia Exposed: No Necrotic Amount: Large (67-100%) Fat Layer (Subcutaneous Tissue) Exposed: Yes Necrotic Quality: Eschar, Adherent Slough Tendon Exposed: No Muscle Exposed: No Joint Exposed: No Bone Exposed: No Periwound Skin Texture Bohle, Azir A. ([id number]) Texture Color No Abnormalities Noted: No No Abnormalities Noted: No Callus: No Atrophie Blanche: No Crepitus: No Cyanosis: No Excoriation: No Ecchymosis: No Induration: No Erythema: No Rash: No Hemosiderin Staining: No Scarring: No Mottled: No Pallor: No Moisture Rubor: No No Abnormalities Noted: No Dry / Scaly: No Temperature / Pain Maceration: No Temperature: No Abnormality Tenderness on Palpation: Yes Wound Preparation Ulcer Cleansing: Wound Cleanser Topical Anesthetic Applied: Other: lidocaine 4%, Electronic Signature(s) Signed: 03/01/2018 2:30:40 PM By: Secundino Ginger Entered By: Secundino Ginger on 03/01/2018 13:34:27 [name] ([id number]) -------------------------------------------------------------------------------- Vitals Details Patient Name: Jesse Floor A. Date of Service: 03/01/2018 1:30 PM Medical Record Number: [id number] Patient Account Number: [id number] Date of Birth/Sex: 03-26-1935 (83 y.o. M) Treating RN: Cornell Barman Primary Care Amarri Michaelson: Katheren Shams Other Clinician: Referring Edlin Ford: Katheren Shams Treating Teja Costen/Extender: Tito Dine in Treatment: 4 Vital Signs Time Taken:  13:22 Temperature (F): 98.2 Height (in): 67 Pulse (bpm): 74 Weight (lbs): 167 Respiratory Rate (breaths/min): 16 Body Mass Index (BMI): 26.2 Blood Pressure (mmHg): 118/61 Reference Range: 80 - 120 mg / dl Electronic Signature(s) Signed: 03/01/2018 4:51:34 PM By: Lorine Bears RCP, RRT, CHT Entered By: Lorine Bears on 03/01/2018 13:26:19

## 2018-03-04 NOTE — Progress Notes (Signed)
JAMAREA, SELNER ([id number]) Visit Report for 03/01/2018 HPI Details Patient Name: [name], [name]. Date of Service: 03/01/2018 1:30 PM Medical Record Number: [id number] Patient Account Number: [id number] Date of Birth/Sex: 05/31/1934 (83 y.o. M) Treating RN: Cornell Barman Primary Care Provider: Katheren Shams Other Clinician: Referring Provider: Katheren Shams Treating Provider/Extender: Tito Dine in Treatment: 4 History of Present Illness Location: Left foot surgical site in the distal/lateral region Quality: Patient describes the pain [name] sore/burning sensation Severity: 3/10 at rest and 8/10 with manipulation Duration: Patientos surgery was on 10/15/16 with Dr. Albertine Patricia DPM Context: Patient had an amputation of the left 3rd - 5th toes on 10/15/16 and the surgical site has not healed following Modifying Factors: Currently this has been packed with saline soaked gauze in [name] wet to dry format Associated Signs and Symptoms: HTN, DM, [name]-fib, chronic Coumadin use HPI Description: 11/29/16 patient presents today for evaluation concerning [name] nonhealing surgical wound following amputation of his left third through fifth toes which was performed on 10/15/16. Unfortunately following this amputation the circle side has not closed appropriately and he continues to have [name] significant amount of discomfort. He did have an x-ray performed on 10/21/16 which showed him being status post D3 toe amputation but otherwise no acute findings. Patient also has venous imaging of the left lower extremity which revealed no evidence of DVT on 10/22/16. His last hemoglobin [name] 1C was 8.5 and this was on 10/22/16 and prior to his amputation patient did have vascular surgery with Dr. Leotis Pain where he had [name] catheter placed in the left anterior tibialis/dorsalis pedis artery from the right femoral approach. Percutaneous transluminal angioplasty of the left anterior tibial artery/dorsalis pedis artery  was performed. This procedure apparently went very well. Unfortunately his healing just has not been what both Dr. Elvina Mattes nor himself were expecting or wanting. The question posed to [place] today is whether hyperbaric therapy would be of benefit for him. READMISSION 02/01/18 This is now [name] 83 year old man we had in the clinic over 2 years ago. At that point he was here for evaluation of amputations of several of his toes on his left foot with [name] nonhealing surgical wound. He was only here for 1 visit. The patient tells me he went on to have [name] left below-knee amputation slightly over [name] year ago. Sometime earlier this year perhaps in April he traumatized his right shin and he has had an open wound here since then. He is already seen Dr. Lucky Cowboy and vein and vascular. He had noninvasive tests that showed an ABI on the right of 1.23 but [name] TBI in the right of only 0.27. He had monophasic waveforms at the anterior tibial and biphasic at the posterior tibial. The overall interpretation was that although his resting right ankle brachial index is within normal limits this may be falsely elevated. Furthermore his toe brachial index was quite [name] bit reduced. According to the last note from vein and vascular on 01/27/18 they discussed angiogram with possible revascularization on the right. He was felt to be reasonably high risk because of his and inability inability to stay still requiring general anesthesia therefore. They told him to think about it and get back to them. He was referred here for review of this wound. As I understand things they've been using Xeroform I'm not clear what they've been putting on this although they did talk about "an ointment". 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 During his admission  to hospital in July MRSA was apparently cultured from this wound and he received [name] course of Doxy. As he has had recent noninvasive arterial studies done on 01/27/18,no ABIs were felt to be necessary here. 02/08/18; patient arrives in with [name] better looking wound. Dimensions are smaller. using silver collagen 02/15/18; wound is measuring smaller. Still with adherent surface material requiring debridement. We've been using silver collagen 02/22/18; wound is measuring smaller. No debridement is required. We're using silver collagen under compression. Colstrip ([id number]) 03/01/18; continued improvement in wound dimensions. No debridement is required. We've been using silver collagen under compression Electronic Signature(s) Signed: 03/01/2018 6:14:59 PM By: Linton Ham MD Entered By: Linton Ham on 03/01/2018 17:01:38 [name] ([id number]) -------------------------------------------------------------------------------- Physical Exam Details Patient Name: [name]. Date of Service: 03/01/2018 1:30 PM Medical Record Number: [id number] Patient Account Number: [id number] Date of Birth/Sex: 1935-03-08 (83 y.o. M) Treating RN: Cornell Barman Primary Care Provider: Katheren Shams Other Clinician: Referring Provider: Katheren Shams Treating Provider/Extender: Tito Dine in Treatment: 4 Constitutional Sitting or standing Blood Pressure is within target range for patient.. Pulse regular and within target range for patient.Marland Kitchen Respirations regular, non-labored and within target range.. Temperature is normal and within the target range for the patient.Marland Kitchen appears in no distress. Notes Wound exam; the area on the right anterior tibia. No debridement is required the wound is smaller surface healthy. Electronic Signature(s) Signed: 03/01/2018 6:14:59 PM By: Linton Ham MD Entered By: Linton Ham on 03/01/2018 17:02:20 [name]  ([id number]) -------------------------------------------------------------------------------- Physician Orders Details Patient Name: [name]. Date of Service: 03/01/2018 1:30 PM Medical Record Number: [id number] Patient Account Number: [id number] Date of Birth/Sex: Apr 13, 1935 (83 y.o. M) Treating RN: Cornell Barman Primary Care Provider: Katheren Shams Other Clinician: Referring Provider: Katheren Shams Treating Provider/Extender: Tito Dine in Treatment: 4 Verbal / Phone Orders: No Diagnosis Coding Wound Cleansing Wound #2 Right,Medial Lower Leg o May Shower, gently pat wound dry prior to applying new dressing. Anesthetic (add to Medication List) Wound #2 Right,Medial Lower Leg o Topical Lidocaine 4% cream applied to wound bed prior to debridement (In Clinic Only). Primary Wound Dressing Wound #2 Right,Medial Lower Leg o Hydrogel - or [place] Jelly to moisten silver collagen o Silver Collagen - moistened with hydrogel or KY jelly Secondary Dressing Wound #2 Right,Medial Lower Leg o ABD and Kerlix/Conform Dressing Change Frequency Wound #2 Right,Medial Lower Leg o Change Dressing Monday, Wednesday, Friday Follow-up Appointments Wound #2 Right,Medial Lower Leg o Return Appointment in 1 week. Edema Control Wound #2 Right,Medial Lower Leg o Elevate legs to the level of the heart and pump ankles as often as possible Electronic Signature(s) Signed: 03/01/2018 5:34:51 PM By: Gretta Cool, BSN, RN, CWS, Kim RN, BSN Signed: 03/01/2018 6:14:59 PM By: Linton Ham MD Entered By: Gretta Cool, BSN, RN, CWS, Kim on 03/01/2018 14:02:28 [name] ([id number]) -------------------------------------------------------------------------------- Problem List Details Patient Name: [name]. Date of Service: 03/01/2018 1:30 PM Medical Record Number: [id number] Patient Account Number: [id number] Date of Birth/Sex: 09-Mar-1935 (83 y.o. M) Treating RN:  Cornell Barman Primary Care Provider: Katheren Shams Other Clinician: Referring Provider: Katheren Shams Treating Provider/Extender: Tito Dine in Treatment: 4 Active Problems ICD-10 Evaluated Encounter Code Description Active Date Today Diagnosis L97.212 Non-pressure chronic ulcer of right calf with fat layer  exposed 02/01/2018 No Yes E11.622 Type 2 diabetes mellitus with other skin ulcer 02/01/2018 No Yes E11.51 Type 2 diabetes mellitus with diabetic peripheral angiopathy 02/01/2018 No Yes without gangrene Inactive Problems Resolved Problems Electronic Signature(s) Signed: 03/01/2018 6:14:59 PM By: Linton Ham MD Entered By: Linton Ham on 03/01/2018 17:00:00 [name] ([id number]) -------------------------------------------------------------------------------- Progress Note Details Patient Name: [name]. Date of Service: 03/01/2018 1:30 PM Medical Record Number: [id number] Patient Account Number: [id number] Date of Birth/Sex: June 23, 1934 (83 y.o. M) Treating RN: Cornell Barman Primary Care Provider: Katheren Shams Other Clinician: Referring Provider: Katheren Shams Treating Provider/Extender: Tito Dine in Treatment: 4 Subjective History of Present Illness (HPI) The following HPI elements were documented for the patient's wound: Location: Left foot surgical site in the distal/lateral region Quality: Patient describes the pain [name] sore/burning sensation Severity: 3/10 at rest and 8/10 with manipulation Duration: Patient s surgery was on 10/15/16 with Dr. Albertine Patricia DPM Context: Patient had an amputation of the left 3rd - 5th toes on 10/15/16 and the surgical site has not healed following Modifying Factors: Currently this has been packed with saline soaked gauze in [name] wet to dry format Associated Signs and Symptoms: HTN, DM, [name]-fib, chronic Coumadin use 11/29/16 patient presents today for evaluation concerning [name]  nonhealing surgical wound following amputation of his left third through fifth toes which was performed on 10/15/16. Unfortunately following this amputation the circle side has not closed appropriately and he continues to have [name] significant amount of discomfort. He did have an x-ray performed on 10/21/16 which showed him being status post D3 toe amputation but otherwise no acute findings. Patient also has venous imaging of the left lower extremity which revealed no evidence of DVT on 10/22/16. His last hemoglobin [name] 1C was 8.5 and this was on 10/22/16 and prior to his amputation patient did have vascular surgery with Dr. Leotis Pain where he had [name] catheter placed in the left anterior tibialis/dorsalis pedis artery from the right femoral approach. Percutaneous transluminal angioplasty of the left anterior tibial artery/dorsalis pedis artery was performed. This procedure apparently went very well. Unfortunately his healing just has not been what both Dr. Elvina Mattes nor himself were expecting or wanting. The question posed to [place] today is whether hyperbaric therapy would be of benefit for him. READMISSION 02/01/18 This is now [name] 83 year old man we had in the clinic over 2 years ago. At that point he was here for evaluation of amputations of several of his toes on his left foot with [name] nonhealing surgical wound. He was only here for 1 visit. The patient tells me he went on to have [name] left below-knee amputation slightly over [name] year ago. Sometime earlier this year perhaps in April he traumatized his right shin and he has had an open wound here since then. He is already seen Dr. Lucky Cowboy and vein and vascular. He had noninvasive tests that showed an ABI on the right of 1.23 but [name] TBI in the right of only 0.27. He had monophasic waveforms at the anterior tibial and biphasic at the posterior tibial. The overall interpretation was that although his resting right ankle brachial index is within normal limits this may be falsely  elevated. Furthermore his toe brachial index was quite [name] bit reduced. According to the last note from vein and vascular on 01/27/18 they discussed angiogram with possible revascularization on the right. He was felt to be reasonably high risk because of his and inability inability to stay still requiring general anesthesia  therefore. They told him to think about it and get back to them. He was referred here for review of this wound. As I understand things they've been using Xeroform I'm not clear what they've been putting on this although they did talk about "an ointment". 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 During his admission to hospital in July MRSA was apparently cultured from this wound and he received [name] course of Doxy. As he has had recent noninvasive arterial studies done on 01/27/18,no ABIs were felt to be necessary here. 02/08/18; patient arrives in with [name] better looking wound. Dimensions are smaller. using silver collagen 02/15/18; wound is measuring smaller. Still with adherent surface material requiring debridement. We've been using silver collagen 02/22/18; wound is measuring smaller. No debridement is required. We're using silver collagen under compression. 03/01/18; continued improvement in wound dimensions. No debridement is required. We've been using silver collagen under Lapidus, Siah [name]. ([id number]) compression Objective Constitutional Sitting or standing Blood Pressure is within target range for patient.. Pulse regular and within target range for patient.Marland Kitchen Respirations regular, non-labored and within target range.. Temperature is normal and within the target range for the patient.Marland Kitchen appears in no distress. Vitals Time Taken: 1:22 PM, Height: 67 in, Weight: 167 lbs, BMI:  26.2, Temperature: 98.2 F, Pulse: 74 bpm, Respiratory Rate: 16 breaths/min, Blood Pressure: 118/61 mmHg. General Notes: Wound exam; the area on the right anterior tibia. No debridement is required the wound is smaller surface healthy. Integumentary (Hair, Skin) Wound #2 status is Open. Original cause of wound was Gradually Appeared. The wound is located on the Right,Medial Lower Leg. The wound measures 0.8cm length x 0.7cm width x 0.2cm depth; 0.44cm^2 area and 0.088cm^3 volume. There is Fat Layer (Subcutaneous Tissue) Exposed exposed. There is no tunneling or undermining noted. There is [name] small amount of serous drainage noted. The wound margin is distinct with the outline attached to the wound base. There is small (1-33%) red, pink granulation within the wound bed. There is [name] large (67-100%) amount of necrotic tissue within the wound bed including Eschar and Adherent Slough. The periwound skin appearance did not exhibit: Callus, Crepitus, Excoriation, Induration, Rash, Scarring, Dry/Scaly, Maceration, Atrophie Blanche, Cyanosis, Ecchymosis, Hemosiderin Staining, Mottled, Pallor, Rubor, Erythema. Periwound temperature was noted as No Abnormality. The periwound has tenderness on palpation. Assessment Active Problems ICD-10 Non-pressure chronic ulcer of right calf with fat layer exposed Type 2 diabetes mellitus with other skin ulcer Type 2 diabetes mellitus with diabetic peripheral angiopathy without gangrene Plan Wound Cleansing: Wound #2 Right,Medial Lower Leg: May Shower, gently pat wound dry prior to applying new dressing. Anesthetic (add to Medication List): OSMAR, HOWTON [name]. ([id number]) Wound #2 Right,Medial Lower Leg: Topical Lidocaine 4% cream applied to wound bed prior to debridement (In Clinic Only). Primary Wound Dressing: Wound #2 Right,Medial Lower Leg: Hydrogel - or [place] Jelly to moisten silver collagen Silver Collagen - moistened with hydrogel or KY jelly Secondary  Dressing: Wound #2 Right,Medial Lower Leg: ABD and Kerlix/Conform Dressing Change Frequency: Wound #2 Right,Medial Lower Leg: Change Dressing Monday, Wednesday, Friday Follow-up Appointments: Wound #2 Right,Medial Lower Leg: Return Appointment in 1 week. Edema Control: Wound #2 Right,Medial Lower Leg: Elevate legs to the level of the heart and pump ankles as often as possible #1 continue silver collagen #2 ABDs  Kerlix and conform Electronic Signature(s) Signed: 03/01/2018 6:14:59 PM By: Linton Ham MD Entered By: Linton Ham on 03/01/2018 17:03:05 [name] ([id number]) -------------------------------------------------------------------------------- SuperBill Details Patient Name: [name]. Date of Service: 03/01/2018 Medical Record Number: [id number] Patient Account Number: [id number] Date of Birth/Sex: 10/15/1934 (83 y.o. M) Treating RN: Cornell Barman Primary Care Provider: Katheren Shams Other Clinician: Referring Provider: Katheren Shams Treating Provider/Extender: Tito Dine in Treatment: 4 Diagnosis Coding ICD-10 Codes Code Description [phone] Non-pressure chronic ulcer of right calf with fat layer exposed E11.622 Type 2 diabetes mellitus with other skin ulcer E11.51 Type 2 diabetes mellitus with diabetic peripheral angiopathy without gangrene Facility Procedures CPT4 Code: [id number] Description: [phone] - WOUND CARE VISIT-LEV 2 EST PT Modifier: Quantity: 1 Physician Procedures CPT4 Code: [id number] Description: [id number] - WC PHYS LEVEL 2 - EST PT ICD-10 Diagnosis Description L97.212 Non-pressure chronic ulcer of right calf with fat layer exp E11.622 Type 2 diabetes mellitus with other skin ulcer Modifier: osed Quantity: 1 Electronic Signature(s) Signed: 03/01/2018 6:14:59 PM By: Linton Ham MD Entered By: Linton Ham on 03/01/2018 17:03:32

## 2018-03-08 ENCOUNTER — Encounter: Payer: Medicare Other | Admitting: Internal Medicine

## 2018-03-08 DIAGNOSIS — E11622 Type 2 diabetes mellitus with other skin ulcer: Secondary | ICD-10-CM | POA: Diagnosis not present

## 2018-03-10 NOTE — Progress Notes (Signed)
[name], [name] ([id number]) Visit Report for 03/08/2018 HPI Details Patient Name: [name], [name] Date of Service: 03/08/2018 3:30 PM Medical Record Number: [id number] Patient Account Number: [id number] Date of Birth/Sex: March 26, 1935 (83 y.o. M) Treating RN: Cornell Barman Primary Care Provider: Katheren Shams Other Clinician: Referring Provider: Katheren Shams Treating Provider/Extender: Tito Dine in Treatment: 5 History of Present Illness Location: Left foot surgical site in the distal/lateral region Quality: Patient describes the pain a sore/burning sensation Severity: 3/10 at rest and 8/10 with manipulation Duration: Patientos surgery was on 10/15/16 with Dr. Albertine Patricia DPM Context: Patient had an amputation of the left 3rd - 5th toes on 10/15/16 and the surgical site has not healed following Modifying Factors: Currently this has been packed with saline soaked gauze in a wet to dry format Associated Signs and Symptoms: HTN, DM, A-fib, chronic Coumadin use HPI Description: 11/29/16 patient presents today for evaluation concerning a nonhealing surgical wound following amputation of his left third through fifth toes which was performed on 10/15/16. Unfortunately following this amputation the circle side has not closed appropriately and he continues to have a significant amount of discomfort. He did have an x-ray performed on 10/21/16 which showed him being status post D3 toe amputation but otherwise no acute findings. Patient also has venous imaging of the left lower extremity which revealed no evidence of DVT on 10/22/16. His last hemoglobin A 1C was 8.5 and this was on 10/22/16 and prior to his amputation patient did have vascular surgery with Dr. Leotis Pain where he had a catheter placed in the left anterior tibialis/dorsalis pedis artery from the right femoral approach. Percutaneous transluminal angioplasty of the left anterior tibial artery/dorsalis pedis artery  was performed. This procedure apparently went very well. Unfortunately his healing just has not been what both Dr. Elvina Mattes nor himself were expecting or wanting. The question posed to [place] today is whether hyperbaric therapy would be of benefit for him. READMISSION 02/01/18 This is now a 83 year old man we had in the clinic over 2 years ago. At that point he was here for evaluation of amputations of several of his toes on his left foot with a nonhealing surgical wound. He was only here for 1 visit. The patient tells me he went on to have a left below-knee amputation slightly over a year ago. Sometime earlier this year perhaps in April he traumatized his right shin and he has had an open wound here since then. He is already seen Dr. Lucky Cowboy and vein and vascular. He had noninvasive tests that showed an ABI on the right of 1.23 but a TBI in the right of only 0.27. He had monophasic waveforms at the anterior tibial and biphasic at the posterior tibial. The overall interpretation was that although his resting right ankle brachial index is within normal limits this may be falsely elevated. Furthermore his toe brachial index was quite a bit reduced. According to the last note from vein and vascular on 01/27/18 they discussed angiogram with possible revascularization on the right. He was felt to be reasonably high risk because of his and inability inability to stay still requiring general anesthesia therefore. They told him to think about it and get back to them. He was referred here for review of this wound. As I understand things they've been using Xeroform I'm not clear what they've been putting on this although they did talk about "an ointment". I wonder whether this was Santyl The patient has had a history of coronary artery  disease with a non-STEMI MI in August, diastolic heart failure, atrial fib, type 2 diabetes with known PAD, left BKA one year ago, hypertension and seizure disorder. During his admission  to hospital in July MRSA was apparently cultured from this wound and he received a course of Doxy. As he has had recent noninvasive arterial studies done on 01/27/18,no ABIs were felt to be necessary here. 02/08/18; patient arrives in with a better looking wound. Dimensions are smaller. using silver collagen 02/15/18; wound is measuring smaller. Still with adherent surface material requiring debridement. We've been using silver collagen 02/22/18; wound is measuring smaller. No debridement is required. We're using silver collagen under compression. 03/01/18; continued improvement in wound dimensions. No debridement is required. We've been using silver collagen under Porter, Lorenso A. ([id number]) compression 03/08/18; wound stalled in terms of dimensions this week. Surface of the wound still looks healthy we've been using silver collagen under compression he has home health changing this. Change to [facility] today Electronic Signature(s) Signed: 03/08/2018 4:50:13 PM By: Linton Ham MD Entered By: Linton Ham on 03/08/2018 16:40:52 [name] ([id number]) -------------------------------------------------------------------------------- Physical Exam Details Patient Name: Jesse Floor A. Date of Service: 03/08/2018 3:30 PM Medical Record Number: [id number] Patient Account Number: [id number] Date of Birth/Sex: 1934/08/20 (83 y.o. M) Treating RN: Cornell Barman Primary Care Provider: Katheren Shams Other Clinician: Referring Provider: Katheren Shams Treating Provider/Extender: Tito Dine in Treatment: 5 Constitutional Patient is hypertensive.. Pulse regular and within target range for patient.Marland Kitchen Respirations regular, non-labored and within target range.. Temperature is normal and within the target range for the patient.Marland Kitchen appears in no distress. Notes Wound exam; the area on the right anterior tibia. Healthy granulation no debridement is required. There  is no evidence of surrounding infection. Electronic Signature(s) Signed: 03/08/2018 4:50:13 PM By: Linton Ham MD Entered By: Linton Ham on 03/08/2018 16:41:37 [name] ([id number]) -------------------------------------------------------------------------------- Physician Orders Details Patient Name: Jesse Floor A. Date of Service: 03/08/2018 3:30 PM Medical Record Number: [id number] Patient Account Number: [id number] Date of Birth/Sex: Mar 05, 1935 (83 y.o. M) Treating RN: Cornell Barman Primary Care Provider: Katheren Shams Other Clinician: Referring Provider: Katheren Shams Treating Provider/Extender: Tito Dine in Treatment: 5 Verbal / Phone Orders: No Diagnosis Coding Wound Cleansing Wound #2 Right,Medial Lower Leg o May Shower, gently pat wound dry prior to applying new dressing. Anesthetic (add to Medication List) Wound #2 Right,Medial Lower Leg o Topical Lidocaine 4% cream applied to wound bed prior to debridement (In Clinic Only). Primary Wound Dressing Wound #2 Right,Medial Lower Leg o Hydrafera Blue Ready Transfer Secondary Dressing Wound #2 Right,Medial Lower Leg o ABD and Kerlix/Conform Dressing Change Frequency Wound #2 Right,Medial Lower Leg o Change Dressing Monday, Wednesday, Friday Follow-up Appointments Wound #2 Right,Medial Lower Leg o Return Appointment in 1 week. Edema Control Wound #2 Right,Medial Lower Leg o Elevate legs to the level of the heart and pump ankles as often as possible Electronic Signature(s) Signed: 03/08/2018 4:50:13 PM By: Linton Ham MD Signed: 03/08/2018 5:17:02 PM By: Gretta Cool, BSN, RN, CWS, Kim RN, BSN Entered By: Gretta Cool, BSN, RN, CWS, Kim on 03/08/2018 15:51:56 [name] ([id number]) -------------------------------------------------------------------------------- Problem List Details Patient Name: Jesse Floor A. Date of Service: 03/08/2018 3:30 PM Medical  Record Number: [id number] Patient Account Number: [id number] Date of Birth/Sex: [birth year] (83 y.o. M) Treating RN: Cornell Barman Primary Care Provider: Katheren Shams Other Clinician: Referring Provider: Katheren Shams Treating Provider/Extender: Tito Dine in Treatment: 5 Active Problems ICD-10 Evaluated Encounter Code Description  Active Date Today Diagnosis L97.212 Non-pressure chronic ulcer of right calf with fat layer exposed 02/01/2018 No Yes E11.622 Type 2 diabetes mellitus with other skin ulcer 02/01/2018 No Yes E11.51 Type 2 diabetes mellitus with diabetic peripheral angiopathy 02/01/2018 No Yes without gangrene Inactive Problems Resolved Problems Electronic Signature(s) Signed: 03/08/2018 4:50:13 PM By: Linton Ham MD Entered By: Linton Ham on 03/08/2018 16:40:04 [name], [name] ([id number]) -------------------------------------------------------------------------------- Progress Note Details Patient Name: Jesse Floor A. Date of Service: 03/08/2018 3:30 PM Medical Record Number: [id number] Patient Account Number: [id number] Date of Birth/Sex: May 24, 1935 (83 y.o. M) Treating RN: Cornell Barman Primary Care Provider: Katheren Shams Other Clinician: Referring Provider: Katheren Shams Treating Provider/Extender: Tito Dine in Treatment: 5 Subjective History of Present Illness (HPI) The following HPI elements were documented for the patient's wound: Location: Left foot surgical site in the distal/lateral region Quality: Patient describes the pain a sore/burning sensation Severity: 3/10 at rest and 8/10 with manipulation Duration: Patient s surgery was on 10/15/16 with Dr. Albertine Patricia DPM Context: Patient had an amputation of the left 3rd - 5th toes on 10/15/16 and the surgical site has not healed following Modifying Factors: Currently this has been packed with saline soaked gauze in a wet to dry  format Associated Signs and Symptoms: HTN, DM, A-fib, chronic Coumadin use 11/29/16 patient presents today for evaluation concerning a nonhealing surgical wound following amputation of his left third through fifth toes which was performed on 10/15/16. Unfortunately following this amputation the circle side has not closed appropriately and he continues to have a significant amount of discomfort. He did have an x-ray performed on 10/21/16 which showed him being status post D3 toe amputation but otherwise no acute findings. Patient also has venous imaging of the left lower extremity which revealed no evidence of DVT on 10/22/16. His last hemoglobin A 1C was 8.5 and this was on 10/22/16 and prior to his amputation patient did have vascular surgery with Dr. Leotis Pain where he had a catheter placed in the left anterior tibialis/dorsalis pedis artery from the right femoral approach. Percutaneous transluminal angioplasty of the left anterior tibial artery/dorsalis pedis artery was performed. This procedure apparently went very well. Unfortunately his healing just has not been what both Dr. Elvina Mattes nor himself were expecting or wanting. The question posed to [place] today is whether hyperbaric therapy would be of benefit for him. READMISSION 02/01/18 This is now a 83 year old man we had in the clinic over 2 years ago. At that point he was here for evaluation of amputations of several of his toes on his left foot with a nonhealing surgical wound. He was only here for 1 visit. The patient tells me he went on to have a left below-knee amputation slightly over a year ago. Sometime earlier this year perhaps in April he traumatized his right shin and he has had an open wound here since then. He is already seen Dr. Lucky Cowboy and vein and vascular. He had noninvasive tests that showed an ABI on the right of 1.23 but a TBI in the right of only 0.27. He had monophasic waveforms at the anterior tibial and biphasic at the posterior  tibial. The overall interpretation was that although his resting right ankle brachial index is within normal limits this may be falsely elevated. Furthermore his toe brachial index was quite a bit reduced. According to the last note from vein and vascular on 01/27/18 they discussed angiogram with possible revascularization on the right. He was felt to be reasonably  high risk because of his and inability inability to stay still requiring general anesthesia therefore. They told him to think about it and get back to them. He was referred here for review of this wound. As I understand things they've been using Xeroform I'm not clear what they've been putting on this although they did talk about "an ointment". I wonder whether this was [name] The patient has had a history of coronary artery disease with a non-STEMI MI in August, diastolic heart failure, atrial fib, type 2 diabetes with known PAD, left BKA one year ago, hypertension and seizure disorder. During his admission to hospital in July MRSA was apparently cultured from this wound and he received a course of Doxy. As he has had recent noninvasive arterial studies done on 01/27/18,no ABIs were felt to be necessary here. 02/08/18; patient arrives in with a better looking wound. Dimensions are smaller. using silver collagen 02/15/18; wound is measuring smaller. Still with adherent surface material requiring debridement. We've been using silver collagen 02/22/18; wound is measuring smaller. No debridement is required. We're using silver collagen under compression. 03/01/18; continued improvement in wound dimensions. No debridement is required. We've been using silver collagen under [name], [name] ([id number]) compression 03/08/18; wound stalled in terms of dimensions this week. Surface of the wound still looks healthy we've been using silver collagen under compression he has home health changing this. Change to Hydrofera Blue  today Objective Constitutional Patient is hypertensive.. Pulse regular and within target range for patient.Marland Kitchen Respirations regular, non-labored and within target range.. Temperature is normal and within the target range for the patient.Marland Kitchen appears in no distress. Vitals Time Taken: 3:35 PM, Height: 67 in, Weight: 167 lbs, BMI: 26.2, Temperature: 97.8 F, Pulse: 74 bpm, Respiratory Rate: 16 breaths/min, Blood Pressure: 146/91 mmHg. General Notes: Wound exam; the area on the right anterior tibia. Healthy granulation no debridement is required. There is no evidence of surrounding infection. Integumentary (Hair, Skin) Wound #2 status is Open. Original cause of wound was Gradually Appeared. The wound is located on the Right,Medial Lower Leg. The wound measures 0.8cm length x 0.7cm width x 0.3cm depth; 0.44cm^2 area and 0.132cm^3 volume. There is Fat Layer (Subcutaneous Tissue) Exposed exposed. There is no tunneling or undermining noted. There is a small amount of serous drainage noted. The wound margin is distinct with the outline attached to the wound base. There is small (1-33%) red, pink granulation within the wound bed. There is a small (1-33%) amount of necrotic tissue within the wound bed including Eschar and Adherent Slough. The periwound skin appearance exhibited: Erythema. The periwound skin appearance did not exhibit: Callus, Crepitus, Excoriation, Induration, Rash, Scarring, Dry/Scaly, Maceration, Atrophie Blanche, Cyanosis, Ecchymosis, Hemosiderin Staining, Mottled, Pallor, Rubor. The surrounding wound skin color is noted with erythema which is circumferential. Periwound temperature was noted as No Abnormality. The periwound has tenderness on palpation. Assessment Active Problems ICD-10 Non-pressure chronic ulcer of right calf with fat layer exposed Type 2 diabetes mellitus with other skin ulcer Type 2 diabetes mellitus with diabetic peripheral angiopathy without gangrene Plan Wound  Cleansing: Wound #2 Right,Medial Lower Leg: [name], [name] ([id number]) May Shower, gently pat wound dry prior to applying new dressing. Anesthetic (add to Medication List): Wound #2 Right,Medial Lower Leg: Topical Lidocaine 4% cream applied to wound bed prior to debridement (In Clinic Only). Primary Wound Dressing: Wound #2 Right,Medial Lower Leg: Hydrafera Blue Ready Transfer Secondary Dressing: Wound #2 Right,Medial Lower Leg: ABD and Kerlix/Conform Dressing Change Frequency: Wound #2 Right,Medial Lower  Leg: Change Dressing Monday, Wednesday, Friday Follow-up Appointments: Wound #2 Right,Medial Lower Leg: Return Appointment in 1 week. Edema Control: Wound #2 Right,Medial Lower Leg: Elevate legs to the level of the heart and pump ankles as often as possible #1 change the dressing to Hydrofera Blue with thes same external compression wraps Electronic Signature(s) Signed: 03/08/2018 4:50:13 PM By: Linton Ham MD Entered By: Linton Ham on 03/08/2018 16:42:07 [name] ([id number]) -------------------------------------------------------------------------------- SuperBill Details Patient Name: Jesse Floor A. Date of Service: 03/08/2018 Medical Record Number: [id number] Patient Account Number: [id number] Date of Birth/Sex: Oct 16, 1934 (83 y.o. M) Treating RN: Cornell Barman Primary Care Provider: Katheren Shams Other Clinician: Referring Provider: Katheren Shams Treating Provider/Extender: Tito Dine in Treatment: 5 Diagnosis Coding ICD-10 Codes Code Description [phone] Non-pressure chronic ulcer of right calf with fat layer exposed E11.622 Type 2 diabetes mellitus with other skin ulcer E11.51 Type 2 diabetes mellitus with diabetic peripheral angiopathy without gangrene Facility Procedures CPT4 Code: [id number] Description: [phone] - WOUND CARE VISIT-LEV 2 EST PT Modifier: Quantity: 1 Physician Procedures CPT4 Code:  [id number] Description: 67619 - WC PHYS LEVEL 2 - EST PT ICD-10 Diagnosis Description L97.212 Non-pressure chronic ulcer of right calf with fat layer exp E11.622 Type 2 diabetes mellitus with other skin ulcer Modifier: osed Quantity: 1 Electronic Signature(s) Signed: 03/08/2018 4:50:13 PM By: Linton Ham MD Entered By: Linton Ham on 03/08/2018 16:42:52

## 2018-03-10 NOTE — Progress Notes (Signed)
NAFIS, FARNAN ([id number]) Visit Report for 03/08/2018 Arrival Information Details Patient Name: MAZIAH, KEELING A. Date of Service: 03/08/2018 3:30 PM Medical Record Number: [id number] Patient Account Number: [id number] Date of Birth/Sex: 01-May-1935 (83 y.o. M) Treating RN: Cornell Barman Primary Care [place]lee [place]: Katheren Shams Other Clinician: Referring Plummer Matich: Katheren Shams Treating Gaylen Pereira/Extender: Tito Dine in Treatment: 5 Visit Information History Since Last Visit Added or deleted any medications: No Patient Arrived: Cane Any new allergies or adverse reactions: No Arrival Time: 15:34 Had a fall or experienced change in No Accompanied By: wife activities of daily living that may affect Transfer Assistance: None risk of falls: Patient Identification Verified: Yes Signs or symptoms of abuse/neglect since last visito No Secondary Verification Process Yes Hospitalized since last visit: No Completed: Implantable device outside of the clinic excluding No Patient Has Alerts: Yes cellular tissue based products placed in the center Patient Alerts: 01-27-18 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 Date of Service: 03/08/2018 3:30 PM Medical Record Number: [id number] Patient Account Number: [id number] Date of Birth/Sex: 1934/09/21 (83 y.o. M) Treating RN: Cornell Barman Primary Care Sybella Harnish: Katheren Shams Other Clinician: Referring Yina Riviere: Katheren Shams Treating Atticus Lemberger/Extender: Tito Dine in  Treatment: 5 Clinic Level of Care Assessment Items TOOL 4 Quantity Score []  - Use when only an EandM is performed on FOLLOW-UP visit 0 ASSESSMENTS - Nursing Assessment / Reassessment []  - Reassessment of Co-morbidities (includes updates in patient status) 0 []  - 0 Reassessment of Adherence to Treatment Plan ASSESSMENTS - Wound and Skin Assessment / Reassessment X - Simple Wound Assessment / Reassessment - one wound 1 5 []  - 0 Complex Wound Assessment / Reassessment - multiple wounds []  - 0 Dermatologic / Skin Assessment (not related to wound area) ASSESSMENTS - Focused Assessment []  - Circumferential Edema Measurements - multi extremities 0 []  - 0 Nutritional Assessment / Counseling / Intervention []  - 0 Lower Extremity Assessment (monofilament, tuning fork, pulses) []  - 0 Peripheral Arterial Disease Assessment (using hand held doppler) ASSESSMENTS - Ostomy and/or Continence Assessment and Care []  - Incontinence Assessment and Management 0 []  - 0 Ostomy Care Assessment and Management (repouching, etc.) PROCESS - Coordination of Care X - Simple Patient / Family Education for ongoing care 1 15 []  - 0 Complex (extensive) Patient / Family Education for ongoing care []  - 0 Staff obtains Programmer, systems, Records, Test Results / Process Orders []  - 0 Staff telephones HHA, Nursing Homes / Clarify orders / etc []  - 0 Routine Transfer to another Facility (non-emergent condition) []  - 0 Routine Hospital Admission (non-emergent condition) []  - 0 New Admissions / Biomedical engineer / Ordering NPWT, Apligraf, etc. []  - 0 Emergency Hospital Admission (emergent condition) X- 1 10 Simple Discharge Coordination Mousseau, Ildefonso A. ([id number]) []  - 0 Complex (extensive) Discharge Coordination PROCESS - Special Needs []  - Pediatric / Minor Patient Management 0 []  - 0 Isolation Patient Management []  - 0 Hearing / Language / Visual special needs []  - 0 Assessment of Community assistance  (transportation, D/C planning, etc.) []  - 0 Additional assistance / Altered mentation []  - 0 Support Surface(s) Assessment (bed, cushion, seat, etc.) INTERVENTIONS - Wound Cleansing / Measurement X - Simple Wound Cleansing -  one wound 1 5 []  - 0 Complex Wound Cleansing - multiple wounds X- 1 5 Wound Imaging (photographs - any number of wounds) []  - 0 Wound Tracing (instead of photographs) X- 1 5 Simple Wound Measurement - one wound []  - 0 Complex Wound Measurement - multiple wounds INTERVENTIONS - Wound Dressings []  - Small Wound Dressing one or multiple wounds 0 X- 1 15 Medium Wound Dressing one or multiple wounds []  - 0 Large Wound Dressing one or multiple wounds []  - 0 Application of Medications - topical []  - 0 Application of Medications - injection INTERVENTIONS - Miscellaneous []  - External ear exam 0 []  - 0 Specimen Collection (cultures, biopsies, blood, body fluids, etc.) []  - 0 Specimen(s) / Culture(s) sent or taken to Lab for analysis []  - 0 Patient Transfer (multiple staff / Civil Service fast streamer / Similar devices) []  - 0 Simple Staple / Suture removal (25 or less) []  - 0 Complex Staple / Suture removal (26 or more) []  - 0 Hypo / Hyperglycemic Management (close monitor of Blood Glucose) []  - 0 Ankle / Brachial Index (ABI) - do not check if billed separately X- 1 5 Vital Signs Marek, Pookela A. ([id number]) Has the patient been seen at the hospital within the last three years: Yes Total Score: 65 Level Of Care: New/Established - Level 2 Electronic Signature(s) Signed: 03/08/2018 5:17:02 PM By: Gretta Cool, BSN, RN, CWS, Kim RN, BSN Entered By: Gretta Cool, BSN, RN, CWS, Kim on 03/08/2018 15:52:42 [name] ([id number]) -------------------------------------------------------------------------------- Encounter Discharge Information Details Patient Name: [name] Date of Service: 03/08/2018 3:30 PM Medical Record Number: [id number] Patient Account Number:  [id number] Date of Birth/Sex: 03-20-1935 (83 y.o. M) Treating RN: Cornell Barman Primary Care Faigy Stretch: Katheren Shams Other Clinician: Referring Freida Nebel: Katheren Shams Treating Shamekia Tippets/Extender: Tito Dine in Treatment: 5 Encounter Discharge Information Items Discharge Condition: Stable Ambulatory Status: Ambulatory Discharge Destination: Home Transportation: Private Auto Accompanied By: wife Schedule Follow-up Appointment: Yes Clinical Summary of Care: Electronic Signature(s) Signed: 03/08/2018 5:17:02 PM By: Gretta Cool, BSN, RN, CWS, Kim RN, BSN Entered By: Gretta Cool, BSN, RN, CWS, Kim on 03/08/2018 15:57:23 [name] ([id number]) -------------------------------------------------------------------------------- Lower Extremity Assessment Details Patient Name: [name] Date of Service: 03/08/2018 3:30 PM Medical Record Number: [id number] Patient Account Number: [id number] Date of Birth/Sex: 06-Apr-1935 (83 y.o. M) Treating RN: Secundino Ginger Primary Care Henny Strauch: Katheren Shams Other Clinician: Referring Kylil Swopes: Katheren Shams Treating Monette Omara/Extender: Tito Dine in Treatment: 5 Electronic Signature(s) Signed: 03/08/2018 4:03:08 PM By: Secundino Ginger Entered By: Secundino Ginger on 03/08/2018 15:45:35 [place], [name] ([id number]) -------------------------------------------------------------------------------- Multi Wound Chart Details Patient Name: [name] Date of Service: 03/08/2018 3:30 PM Medical Record Number: [id number] Patient Account Number: [id number] Date of Birth/Sex: 05/10/1935 (83 y.o. M) Treating RN: Cornell Barman Primary Care Emmert Roethler: Katheren Shams Other Clinician: Referring Molli Gethers: Katheren Shams Treating Athalee Esterline/Extender: Tito Dine in Treatment: 5 Vital Signs Height(in): 67 Pulse(bpm): 74 Weight(lbs): 167 Blood Pressure(mmHg): 146/91 Body Mass Index(BMI):  26 Temperature(F): 97.8 Respiratory Rate 16 (breaths/min): Photos: [N/A:N/A] Wound Location: Right Lower Leg - Medial N/A N/A Wounding Event: Gradually Appeared N/A N/A Primary Etiology: Diabetic Wound/Ulcer of the N/A N/A Lower Extremity Comorbid History: Cataracts, Sleep Apnea, N/A N/A Arrhythmia, Congestive Heart Failure, Hypertension, Peripheral Arterial Disease, Type II Diabetes, Osteoarthritis Date Acquired: 10/22/2017 N/A N/A Weeks of Treatment: 5 N/A N/A Wound Status: Open N/A N/A Measurements L x W x D 0.8x0.7x0.3 N/A N/A (cm) Area (cm) : 0.44 N/A N/A Volume (cm) :  0.132 N/A N/A % Reduction in Area: 85.90% N/A N/A % Reduction in Volume: 78.80% N/A N/A Classification: Grade 2 N/A N/A Exudate Amount: Small N/A N/A Exudate Type: Serous N/A N/A Exudate Color: amber N/A N/A Wound Margin: Distinct, outline attached N/A N/A Granulation Amount: Small (1-33%) N/A N/A Granulation Quality: Red, Pink N/A N/A Necrotic Amount: Small (1-33%) N/A N/A Necrotic Tissue: Eschar, Adherent Slough N/A N/A Exposed Structures: N/A N/A AADYN, BUCHHEIT A. ([id number]) Fat Layer (Subcutaneous Tissue) Exposed: Yes Fascia: No Tendon: No Muscle: No Joint: No Bone: No Epithelialization: None N/A N/A Periwound Skin Texture: Excoriation: No N/A N/A Induration: No Callus: No Crepitus: No Rash: No Scarring: No Periwound Skin Moisture: Maceration: No N/A N/A Dry/Scaly: No Periwound Skin Color: Erythema: Yes N/A N/A Atrophie Blanche: No Cyanosis: No Ecchymosis: No Hemosiderin Staining: No Mottled: No Pallor: No Rubor: No Erythema Location: Circumferential N/A N/A Temperature: No Abnormality N/A N/A Tenderness on Palpation: Yes N/A N/A Wound Preparation: Ulcer Cleansing: Wound N/A N/A Cleanser Topical Anesthetic Applied: Other: lidocaine 4% Treatment Notes Wound #2 (Right, Medial Lower Leg) 4. Dressing Applied: Prisma Ag 5. Secondary Dressing Applied Gauze and  Kerlix/Conform Electronic Signature(s) Signed: 03/08/2018 4:50:13 PM By: Linton Ham MD Entered By: Linton Ham on 03/08/2018 16:40:16 [name] ([id number]) -------------------------------------------------------------------------------- [place] Details Patient Name: [name] Date of Service: 03/08/2018 3:30 PM Medical Record Number: [id number] Patient Account Number: [id number] Date of Birth/Sex: [birth year] (83 y.o. M) Treating RN: Cornell Barman Primary Care Ona Rathert: Katheren Shams Other Clinician: Referring Kaylie Ritter: Katheren Shams Treating Michaell Grider/Extender: Tito Dine in Treatment: 5 Active Inactive ` Abuse / Safety / Falls / Self Care Management Nursing Diagnoses: Impaired physical mobility Goals: Patient will remain injury free related to falls Date Initiated: 02/01/2018 Target Resolution Date: 04/28/2018 Goal Status: Active Interventions: Assess fall risk on admission and as needed Notes: ` Orientation to the Wound Care Program Nursing Diagnoses: Knowledge deficit related to the wound healing center program Goals: Patient/caregiver will verbalize understanding of the [place] Date Initiated: 02/01/2018 Target Resolution Date: 04/29/2018 Goal Status: Active Interventions: Provide education on orientation to the wound center Notes: ` Wound/Skin Impairment Nursing Diagnoses: Impaired tissue integrity Goals: Ulcer/skin breakdown will heal within 14 weeks Date Initiated: 02/01/2018 Target Resolution Date: 04/29/2018 Goal Status: Active Interventions: MOHANAD, CARSTEN ([id number]) Assess patient/caregiver ability to obtain necessary supplies Assess patient/caregiver ability to perform ulcer/skin care regimen upon admission and as needed Assess ulceration(s) every visit Notes: Electronic Signature(s) Signed: 03/08/2018 5:17:02 PM By: Gretta Cool, BSN, RN, CWS, Kim RN, BSN Entered  By: Gretta Cool, BSN, RN, CWS, Kim on 03/08/2018 15:51:09 [name] ([id number]) -------------------------------------------------------------------------------- Pain Assessment Details Patient Name: [name] Date of Service: 03/08/2018 3:30 PM Medical Record Number: [id number] Patient Account Number: [id number] Date of Birth/Sex: [birth year] (83 y.o. M) Treating RN: Cornell Barman Primary Care Anina Schnake: Katheren Shams Other Clinician: Referring Mariha Sleeper: Katheren Shams Treating Ahmadou Bolz/Extender: Tito Dine in Treatment: 5 Active Problems Location of Pain Severity and Description of Pain Patient Has Paino No Site Locations Pain Management and Medication Current Pain Management: Electronic Signature(s) Signed: 03/08/2018 4:28:03 PM By: Lorine Bears RCP, RRT, CHT Signed: 03/08/2018 5:17:02 PM By: Gretta Cool, BSN, RN, CWS, Kim RN, BSN Entered By: Lorine Bears on 03/08/2018 15:35:41 [name] ([id number]) -------------------------------------------------------------------------------- Patient/Caregiver Education Details Patient Name: [name] Date of Service: 03/08/2018 3:30 PM Medical Record Number: [id number] Patient Account Number: [id number] Date of Birth/Gender: 19-Jul-1934 (83 y.o. M) Treating RN: Gretta Cool,  Maudie Mercury Primary Care Physician: Katheren Shams Other Clinician: Referring Physician: Katheren Shams Treating Physician/Extender: Tito Dine in Treatment: 5 Education Assessment Education Provided To: Patient Education Topics Provided Wound/Skin Impairment: Handouts: Caring for Your Ulcer Methods: Demonstration, Explain/Verbal Responses: State content correctly Electronic Signature(s) Signed: 03/08/2018 5:17:02 PM By: Gretta Cool, BSN, RN, CWS, Kim RN, BSN Entered By: Gretta Cool, BSN, RN, CWS, Kim on 03/08/2018 15:56:41 [name]  ([id number]) -------------------------------------------------------------------------------- Wound Assessment Details Patient Name: [name] Date of Service: 03/08/2018 3:30 PM Medical Record Number: [id number] Patient Account Number: [id number] Date of Birth/Sex: September 15, 1934 (83 y.o. M) Treating RN: Secundino Ginger Primary Care Jeselle Hiser: Katheren Shams Other Clinician: Referring [place]: Katheren Shams Treating Audriana Aldama/Extender: Tito Dine in Treatment: 5 Wound Status Wound Number: 2 Primary Diabetic Wound/Ulcer of the Lower Extremity Etiology: Wound Location: Right Lower Leg - Medial Wound Open Wounding Event: Gradually Appeared Status: Date Acquired: 10/22/2017 Comorbid Cataracts, Sleep Apnea, Arrhythmia, Congestive Weeks Of Treatment: 5 History: Heart Failure, Hypertension, Peripheral Arterial Clustered Wound: No Disease, Type II Diabetes, Osteoarthritis Photos Photo Uploaded By: Secundino Ginger on 03/08/2018 15:47:57 Wound Measurements Length: (cm) 0.8 % Reduction Width: (cm) 0.7 % Reduction Depth: (cm) 0.3 Epitheliali Area: (cm) 0.44 Tunneling: Volume: (cm) 0.132 Underminin in Area: 85.9% in Volume: 78.8% zation: None No g: No Wound Description Classification: Grade 2 Foul Odor A Wound Margin: Distinct, outline attached Slough/Fibr Exudate Amount: Small Exudate Type: Serous Exudate Color: amber fter Cleansing: No ino Yes Wound Bed Granulation Amount: Small (1-33%) Exposed Structure Granulation Quality: Red, Pink Fascia Exposed: No Necrotic Amount: Small (1-33%) Fat Layer (Subcutaneous Tissue) Exposed: Yes Necrotic Quality: Eschar, Adherent Slough Tendon Exposed: No Muscle Exposed: No Joint Exposed: No Bone Exposed: No Periwound Skin Texture Mcminn, Allan A. ([id number]) Texture Color No Abnormalities Noted: No No Abnormalities Noted: No Callus: No Atrophie Blanche: No Crepitus: No Cyanosis: No Excoriation:  No Ecchymosis: No Induration: No Erythema: Yes Rash: No Erythema Location: Circumferential Scarring: No Hemosiderin Staining: No Mottled: No Moisture Pallor: No No Abnormalities Noted: No Rubor: No Dry / Scaly: No Maceration: No Temperature / Pain Temperature: No Abnormality Tenderness on Palpation: Yes Wound Preparation Ulcer Cleansing: Wound Cleanser Topical Anesthetic Applied: Other: lidocaine 4%, Treatment Notes Wound #2 (Right, Medial Lower Leg) 4. Dressing Applied: Prisma Ag 5. Secondary Dressing Applied Gauze and Kerlix/Conform Electronic Signature(s) Signed: 03/08/2018 4:03:08 PM By: Secundino Ginger Entered By: Secundino Ginger on 03/08/2018 15:44:39 [place], [name] ([id number]) -------------------------------------------------------------------------------- [place] Details Patient Name: [name] Date of Service: 03/08/2018 3:30 PM Medical Record Number: [id number] Patient Account Number: [id number] Date of Birth/Sex: 07/26/1934 (83 y.o. M) Treating RN: Cornell Barman Primary Care Joshua Zeringue: Katheren Shams Other Clinician: Referring Felesha Moncrieffe: Katheren Shams Treating Tyreak Reagle/Extender: Tito Dine in Treatment: 5 Vital Signs Time Taken: 15:35 Temperature (F): 97.8 Height (in): 67 Pulse (bpm): 74 Weight (lbs): 167 Respiratory Rate (breaths/min): 16 Body Mass Index (BMI): 26.2 Blood Pressure (mmHg): 146/91 Reference Range: 80 - 120 mg / dl Electronic Signature(s) Signed: 03/08/2018 4:28:03 PM By: Lorine Bears RCP, RRT, CHT Entered By: Becky Sax, Amado Nash on 03/08/2018 15:38:48

## 2018-03-15 ENCOUNTER — Encounter: Payer: Medicare Other | Admitting: Internal Medicine

## 2018-03-15 DIAGNOSIS — E11622 Type 2 diabetes mellitus with other skin ulcer: Secondary | ICD-10-CM | POA: Diagnosis not present

## 2018-03-17 NOTE — Progress Notes (Signed)
BADR, PIEDRA ([id number]) Visit Report for 03/15/2018 Debridement Details Patient Name: [name], [name] Date of Service: 03/15/2018 10:00 AM Medical Record Number: [id number] Patient Account Number: [id number] Date of Birth/Sex: August 16, 1934 (82 y.o. M) Treating RN: Cornell Barman Primary Care Provider: Katheren Shams Other Clinician: Referring Provider: Katheren Shams Treating Provider/Extender: Tito Dine in Treatment: 6 Debridement Performed for Wound #2 Right,Medial Lower Leg Assessment: Performed By: Physician Ricard Dillon, MD Debridement Type: Debridement Severity of Tissue Pre Fat layer exposed Debridement: Level of Consciousness (Pre- Awake and Alert procedure): Pre-procedure Verification/Time Yes - 10:19 Out Taken: Start Time: 10:19 Pain Control: Lidocaine Total Area Debrided (L x W): 0.7 (cm) x 0.6 (cm) = 0.42 (cm) Tissue and other material Viable, Subcutaneous debrided: Level: Skin/Subcutaneous Tissue Debridement Description: Excisional Instrument: Curette Bleeding: Minimum Hemostasis Achieved: Pressure End Time: 10:20 Response to Treatment: Procedure was tolerated well Level of Consciousness Awake and Alert (Post-procedure): Post Debridement Measurements of Total Wound Length: (cm) 0.7 Width: (cm) 0.6 Depth: (cm) 0.2 Volume: (cm) 0.066 Character of Wound/Ulcer Post Debridement: Requires Further Debridement Severity of Tissue Post Debridement: Fat layer exposed Post Procedure Diagnosis Same as Pre-procedure Electronic Signature(s) Signed: 03/15/2018 4:09:45 PM By: Linton Ham MD Signed: 03/15/2018 4:45:51 PM By: Gretta Cool, BSN, RN, CWS, Kim RN, BSN Entered By: Linton Ham on 03/15/2018 10:26:05 [name] ([id number]) -------------------------------------------------------------------------------- HPI Details Patient Name: Jesse Floor A. Date of Service: 03/15/2018 10:00 AM Medical Record  Number: [id number] Patient Account Number: [id number] Date of Birth/Sex: 06-11-1934 (82 y.o. M) Treating RN: Cornell Barman Primary Care Provider: Katheren Shams Other Clinician: Referring Provider: Katheren Shams Treating Provider/Extender: Tito Dine in Treatment: 6 History of Present Illness Location: Left foot surgical site in the distal/lateral region Quality: Patient describes the pain a sore/burning sensation Severity: 3/10 at rest and 8/10 with manipulation Duration: Patientos surgery was on 10/15/16 with Dr. Albertine Patricia DPM Context: Patient had an amputation of the left 3rd - 5th toes on 10/15/16 and the surgical site has not healed following Modifying Factors: Currently this has been packed with saline soaked gauze in a wet to dry format Associated Signs and Symptoms: HTN, DM, A-fib, chronic Coumadin use HPI Description: 11/29/16 patient presents today for evaluation concerning a nonhealing surgical wound following amputation of his left third through fifth toes which was performed on 10/15/16. Unfortunately following this amputation the circle side has not closed appropriately and he continues to have a significant amount of discomfort. He did have an x-ray performed on 10/21/16 which showed him being status post D3 toe amputation but otherwise no acute findings. Patient also has venous imaging of the left lower extremity which revealed no evidence of DVT on 10/22/16. His last hemoglobin A 1C was 8.5 and this was on 10/22/16 and prior to his amputation patient did have vascular surgery with Dr. Leotis Pain where he had a catheter placed in the left anterior tibialis/dorsalis pedis artery from the right femoral approach. Percutaneous transluminal angioplasty of the left anterior tibial artery/dorsalis pedis artery was performed. This procedure apparently went very well. Unfortunately his healing just has not been what both Dr. Elvina Mattes nor himself were expecting or  wanting. The question posed to [place] today is whether hyperbaric therapy would be of benefit for him. READMISSION 02/01/18 This is now a 83 year old man we had in the clinic over 2 years ago. At that point he was here for evaluation of amputations of several of his toes on his left foot with a nonhealing surgical  wound. He was only here for 1 visit. The patient tells me he went on to have a left below-knee amputation slightly over a year ago. Sometime earlier this year perhaps in April he traumatized his right shin and he has had an open wound here since then. He is already seen Dr. Lucky Cowboy and vein and vascular. He had noninvasive tests that showed an ABI on the right of 1.23 but a TBI in the right of only 0.27. He had monophasic waveforms at the anterior tibial and biphasic at the posterior tibial. The overall interpretation was that although his resting right ankle brachial index is within normal limits this may be falsely elevated. Furthermore his toe brachial index was quite a bit reduced. According to the last note from vein and vascular on 01/27/18 they discussed angiogram with possible revascularization on the right. He was felt to be reasonably high risk because of his and inability inability to stay still requiring general anesthesia therefore. They told him to think about it and get back to them. He was referred here for review of this wound. As I understand things they've been using Xeroform I'm not clear what they've been putting on this although they did talk about "an ointment". 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 During his admission to hospital in July MRSA was apparently cultured from this wound and he received a course of Doxy. As he has had recent noninvasive arterial studies  done on 01/27/18,no ABIs were felt to be necessary here. 02/08/18; patient arrives in with a better looking wound. Dimensions are smaller. using silver collagen 02/15/18; wound is measuring smaller. Still with adherent surface material requiring debridement. We've been using silver collagen 02/22/18; wound is measuring smaller. No debridement is required. We're using silver collagen under compression. 03/01/18; continued improvement in wound dimensions. No debridement is required. We've been using silver collagen under compression 03/08/18; wound stalled in terms of dimensions this week. Surface of the wound still looks healthy we've been using silver collagen under compression he has home health changing this. Change to [facility] today [name], [name] ([id number]) 03/15/18; inadvertently put silver collagen on the wound today. In general his wound continues to contract. Using border foam cover Electronic Signature(s) Signed: 03/15/2018 4:09:45 PM By: Linton Ham MD Entered By: Linton Ham on 03/15/2018 10:36:04 [name] ([id number]) -------------------------------------------------------------------------------- Physical Exam Details Patient Name: Jesse Floor A. Date of Service: 03/15/2018 10:00 AM Medical Record Number: [id number] Patient Account Number: [id number] Date of Birth/Sex: 1934-10-11 (82 y.o. M) Treating RN: Cornell Barman Primary Care Provider: Katheren Shams Other Clinician: Referring Provider: Katheren Shams Treating Provider/Extender: Tito Dine in Treatment: 6 Constitutional Patient is hypertensive.. Pulse regular and within target range for patient.Marland Kitchen Respirations regular, non-labored and within target range.. Temperature is normal and within the target range for the patient.Marland Kitchen appears in no distress. Notes Wound exam; area on the right anterior tibia. Adherent necrotic debris removed and the wounds surface with a #3 curet.  He tolerated this well hemostasis with direct pressure. Posterior breathing and a healthy looking granulation Electronic Signature(s) Signed: 03/15/2018 4:09:45 PM By: Linton Ham MD Entered By: Linton Ham on 03/15/2018 10:37:19 [name] ([id number]) -------------------------------------------------------------------------------- Physician Orders Details Patient Name: Jesse Floor A. Date of Service: 03/15/2018 10:00 AM Medical Record Number:  [id number] Patient Account Number: [id number] Date of Birth/Sex: 1935/02/06 (83 y.o. M) Treating RN: Cornell Barman Primary Care Provider: Katheren Shams Other Clinician: Referring Provider: Katheren Shams Treating Provider/Extender: Tito Dine in Treatment: 6 Verbal / Phone Orders: No Diagnosis Coding Wound Cleansing Wound #2 Right,Medial Lower Leg o May Shower, gently pat wound dry prior to applying new dressing. Anesthetic (add to Medication List) Wound #2 Right,Medial Lower Leg o Topical Lidocaine 4% cream applied to wound bed prior to debridement (In Clinic Only). Primary Wound Dressing Wound #2 Right,Medial Lower Leg o Silver Collagen Secondary Dressing Wound #2 Right,Medial Lower Leg o Conform/Kerlix Dressing Change Frequency Wound #2 Right,Medial Lower Leg o Change Dressing Monday, Wednesday, Friday Follow-up Appointments Wound #2 Right,Medial Lower Leg o Return Appointment in 1 week. Edema Control Wound #2 Right,Medial Lower Leg o Elevate legs to the level of the heart and pump ankles as often as possible Electronic Signature(s) Signed: 03/15/2018 4:09:45 PM By: Linton Ham MD Signed: 03/15/2018 4:45:51 PM By: Gretta Cool, BSN, RN, CWS, Kim RN, BSN Entered By: Gretta Cool, BSN, RN, CWS, Kim on 03/15/2018 10:23:30 [name] ([id number]) -------------------------------------------------------------------------------- Problem List Details Patient Name: Jesse Floor  A. Date of Service: 03/15/2018 10:00 AM Medical Record Number: [id number] Patient Account Number: [id number] Date of Birth/Sex: 1934-07-30 (82 y.o. M) Treating RN: Cornell Barman Primary Care Provider: Katheren Shams Other Clinician: Referring Provider: Katheren Shams Treating Provider/Extender: Tito Dine in Treatment: 6 Active Problems ICD-10 Evaluated Encounter Code Description Active Date Today Diagnosis L97.212 Non-pressure chronic ulcer of right calf with fat layer exposed 02/01/2018 No Yes E11.622 Type 2 diabetes mellitus with other skin ulcer 02/01/2018 No Yes E11.51 Type 2 diabetes mellitus with diabetic peripheral angiopathy 02/01/2018 No Yes without gangrene Inactive Problems Resolved Problems Electronic Signature(s) Signed: 03/15/2018 4:09:45 PM By: Linton Ham MD Entered By: Linton Ham on 03/15/2018 10:25:42 [name] ([id number]) -------------------------------------------------------------------------------- Progress Note Details Patient Name: Jesse Floor A. Date of Service: 03/15/2018 10:00 AM Medical Record Number: [id number] Patient Account Number: [id number] Date of Birth/Sex: 09/25/1934 (82 y.o. M) Treating RN: Cornell Barman Primary Care Provider: Katheren Shams Other Clinician: Referring Provider: Katheren Shams Treating Provider/Extender: Tito Dine in Treatment: 6 Subjective History of Present Illness (HPI) The following HPI elements were documented for the patient's wound: Location: Left foot surgical site in the distal/lateral region Quality: Patient describes the pain a sore/burning sensation Severity: 3/10 at rest and 8/10 with manipulation Duration: Patient s surgery was on 10/15/16 with Dr. Albertine Patricia DPM Context: Patient had an amputation of the left 3rd - 5th toes on 10/15/16 and the surgical site has not healed following Modifying Factors: Currently this has been packed with  saline soaked gauze in a wet to dry format Associated Signs and Symptoms: HTN, DM, A-fib, chronic Coumadin use 11/29/16 patient presents today for evaluation concerning a nonhealing surgical wound following amputation of his left third through fifth toes which was performed on 10/15/16. Unfortunately following this amputation the circle side has not closed appropriately and he continues to have a significant amount of discomfort. He did have an x-ray performed on 10/21/16 which showed him being status post D3 toe amputation but otherwise no acute findings. Patient also has venous imaging of the left lower extremity which revealed no evidence of DVT on 10/22/16. His last hemoglobin A 1C was 8.5 and this was on 10/22/16 and prior to his amputation patient did have vascular surgery with Dr. Leotis Pain where he had a catheter placed in  the left anterior tibialis/dorsalis pedis artery from the right femoral approach. Percutaneous transluminal angioplasty of the left anterior tibial artery/dorsalis pedis artery was performed. This procedure apparently went very well. Unfortunately his healing just has not been what both Dr. Elvina Mattes nor himself were expecting or wanting. The question posed to [place] today is whether hyperbaric therapy would be of benefit for him. READMISSION 02/01/18 This is now a 83 year old man we had in the clinic over 2 years ago. At that point he was here for evaluation of amputations of several of his toes on his left foot with a nonhealing surgical wound. He was only here for 1 visit. The patient tells me he went on to have a left below-knee amputation slightly over a year ago. Sometime earlier this year perhaps in April he traumatized his right shin and he has had an open wound here since then. He is already seen Dr. Lucky Cowboy and vein and vascular. He had noninvasive tests that showed an ABI on the right of 1.23 but a TBI in the right of only 0.27. He had monophasic waveforms at the anterior tibial  and biphasic at the posterior tibial. The overall interpretation was that although his resting right ankle brachial index is within normal limits this may be falsely elevated. Furthermore his toe brachial index was quite a bit reduced. According to the last note from vein and vascular on 01/27/18 they discussed angiogram with possible revascularization on the right. He was felt to be reasonably high risk because of his and inability inability to stay still requiring general anesthesia therefore. They told him to think about it and get back to them. He was referred here for review of this wound. As I understand things they've been using Xeroform I'm not clear what they've been putting on this although they did talk about "an ointment". 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 During his admission to hospital in July MRSA was apparently cultured from this wound and he received a course of Doxy. As he has had recent noninvasive arterial studies done on 01/27/18,no ABIs were felt to be necessary here. 02/08/18; patient arrives in with a better looking wound. Dimensions are smaller. using silver collagen 02/15/18; wound is measuring smaller. Still with adherent surface material requiring debridement. We've been using silver collagen 02/22/18; wound is measuring smaller. No debridement is required. We're using silver collagen under compression. 03/01/18; continued improvement in wound dimensions. No debridement is required. We've been using silver collagen under [name], [name] ([id number]) compression 03/08/18; wound stalled in terms of dimensions this week. Surface of the wound still looks healthy we've been using silver collagen under compression he has home health changing this. Change to [employer] today 03/15/18; inadvertently put silver collagen on the wound today. In general his wound continues to contract. Using border foam cover Objective Constitutional Patient is hypertensive.. Pulse regular and within target range for patient.Marland Kitchen Respirations regular, non-labored and within target range.. Temperature is normal and within the target range for the patient.Marland Kitchen appears in no distress. Vitals Time Taken: 10:02 AM, Height: 67 in, Weight: 167 lbs, BMI: 26.2, Temperature: 98.0 F, Pulse: 98 bpm, Respiratory Rate: 16 breaths/min, Blood Pressure: 149/85 mmHg. General Notes: Wound exam; area on the right anterior tibia. Adherent necrotic debris removed and the wounds surface with  a #3 curet. He tolerated this well hemostasis with direct pressure. Posterior breathing and a healthy looking granulation Integumentary (Hair, Skin) Wound #2 status is Open. Original cause of wound was Gradually Appeared. The wound is located on the Right,Medial Lower Leg. The wound measures 0.7cm length x 0.6cm width x 0.2cm depth; 0.33cm^2 area and 0.066cm^3 volume. Assessment Active Problems ICD-10 Non-pressure chronic ulcer of right calf with fat layer exposed Type 2 diabetes mellitus with other skin ulcer Type 2 diabetes mellitus with diabetic peripheral angiopathy without gangrene Procedures Wound #2 Pre-procedure diagnosis of Wound #2 is a Diabetic Wound/Ulcer of the Lower Extremity located on the Right,Medial Lower Leg .Severity of Tissue Pre Debridement is: Fat layer exposed. There was a Excisional Skin/Subcutaneous Tissue Debridement with a total area of 0.42 sq cm performed by Ricard Dillon, MD. With the following instrument(s): Curette to remove Viable tissue/material. Material removed includes Subcutaneous Tissue after achieving pain control using Lidocaine. No specimens were taken. A time out was conducted at 10:19, prior to the start of the procedure. A Minimum amount of bleeding was  controlled with Pressure. The procedure was tolerated well. Post Debridement Measurements: 0.7cm length x 0.6cm width x 0.2cm depth; 0.066cm^3 volume. [name], [name] ([id number]) Character of Wound/Ulcer Post Debridement requires further debridement. Severity of Tissue Post Debridement is: Fat layer exposed. Post procedure Diagnosis Wound #2: Same as Pre-Procedure Plan Wound Cleansing: Wound #2 Right,Medial Lower Leg: May Shower, gently pat wound dry prior to applying new dressing. Anesthetic (add to Medication List): Wound #2 Right,Medial Lower Leg: Topical Lidocaine 4% cream applied to wound bed prior to debridement (In Clinic Only). Primary Wound Dressing: Wound #2 Right,Medial Lower Leg: Silver Collagen Secondary Dressing: Wound #2 Right,Medial Lower Leg: Conform/Kerlix Dressing Change Frequency: Wound #2 Right,Medial Lower Leg: Change Dressing Monday, Wednesday, Friday Follow-up Appointments: Wound #2 Right,Medial Lower Leg: Return Appointment in 1 week. Edema Control: Wound #2 Right,Medial Lower Leg: Elevate legs to the level of the heart and pump ankles as often as possible #1 Silver collagen under border foam. We're making slow but Study progress Electronic Signature(s) Signed: 03/15/2018 4:09:45 PM By: Linton Ham MD Entered By: Linton Ham on 03/15/2018 10:38:41 [name] ([id number]) -------------------------------------------------------------------------------- SuperBill Details Patient Name: Jesse Floor A. Date of Service: 03/15/2018 Medical Record Number: [id number] Patient Account Number: [id number] Date of Birth/Sex: [birth year] (83 y.o. M) Treating RN: Cornell Barman Primary Care Provider: Katheren Shams Other Clinician: Referring Provider: Katheren Shams Treating Provider/Extender: Tito Dine in Treatment: 6 Diagnosis Coding ICD-10 Codes Code Description [phone] Non-pressure chronic ulcer of right calf with  fat layer exposed E11.622 Type 2 diabetes mellitus with other skin ulcer E11.51 Type 2 diabetes mellitus with diabetic peripheral angiopathy without gangrene Facility Procedures CPT4 Code: [id number] Description: 15176 - DEB SUBQ TISSUE 20 SQ CM/< ICD-10 Diagnosis Description L97.212 Non-pressure chronic ulcer of right calf with fat layer exp Modifier: osed Quantity: 1 Physician Procedures CPT4 Code: [id number] Description: 06269 - WC PHYS SUBQ TISS 20 SQ CM ICD-10 Diagnosis Description L97.212 Non-pressure chronic ulcer of right calf with fat layer exp Modifier: osed Quantity: 1 Electronic Signature(s) Signed: 03/15/2018 4:09:45 PM By: Linton Ham MD Entered By: Linton Ham on 03/15/2018 10:40:47

## 2018-03-17 NOTE — Progress Notes (Signed)
[name] ([id number]) Visit Report for 03/15/2018 Arrival Information Details Patient Name: [name], [name] Date of Service: 03/15/2018 10:00 AM Medical Record Number: [id number] Patient Account Number: [id number] Date of Birth/Sex: 10-31-1934 (83 y.o. M) Treating RN: Secundino Ginger Primary Care Deaire Mcwhirter: Katheren Shams Other Clinician: Referring Sabatino Williard: Katheren Shams Treating Kyarah Enamorado/Extender: Tito Dine in Treatment: 6 Visit Information History Since Last Visit Added or deleted any medications: No Patient Arrived: Cane Any new allergies or adverse reactions: No Arrival Time: 10:04 Had a fall or experienced change in No Accompanied By: wife activities of daily living that may affect Transfer Assistance: None risk of falls: Patient Identification Verified: Yes Signs or symptoms of abuse/neglect since last visito No Secondary Verification Process Yes Hospitalized since last visit: No Completed: Implantable device outside of the clinic excluding No Patient Has Alerts: Yes cellular tissue based products placed in the center Patient Alerts: 01-27-18 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 Date of Service: 03/15/2018 10:00 AM Medical Record Number: [id number] Patient Account Number: [id number] Date of Birth/Sex: [birth year] (83 y.o. M) Treating RN: Cornell Barman Primary Care Rondel Episcopo: Katheren Shams Other Clinician: Referring Janis Sol: Katheren Shams Treating Tomasita Beevers/Extender: Tito Dine in Treatment: 6 Encounter Discharge Information  Items Discharge Condition: Stable Ambulatory Status: Ambulatory Discharge Destination: Home Transportation: Private Auto Accompanied By: wife Schedule Follow-up Appointment: Yes Clinical Summary of Care: Post Procedure Vitals: Temperature (F): 98 Pulse (bpm): 98 Respiratory Rate (breaths/min): 16 Blood Pressure (mmHg): 149/85 Electronic Signature(s) Signed: 03/15/2018 4:45:51 PM By: Gretta Cool, BSN, RN, CWS, Kim RN, BSN Entered By: Gretta Cool, BSN, RN, CWS, Kim on 03/15/2018 10:24:30 [name] ([id number]) -------------------------------------------------------------------------------- Lower Extremity Assessment Details Patient Name: Jesse Floor A. Date of Service: 03/15/2018 10:00 AM Medical Record Number: [id number] Patient Account Number: [id number] Date of Birth/Sex: August 19, 1934 (83 y.o. M) Treating RN: Secundino Ginger Primary Care Thora Scherman: Katheren Shams Other Clinician: Referring Joel Mericle: Katheren Shams Treating Horrace Hanak/Extender: Tito Dine in Treatment: 6 Edema Assessment Assessed: [Left: No] [Right: No] [Left: Edema] [Right: :] Calf Left: Right: Point of Measurement: 32 cm From Medial Instep cm 33.5 cm Ankle Left: Right: Point of Measurement: 10 cm From Medial Instep cm 21 cm Vascular Assessment Claudication: Claudication Assessment [Right:None] Pulses: Dorsalis Pedis Palpable: [Right:Yes] Posterior Tibial Extremity colors, hair growth, and conditions: Extremity Color: [Right:Normal] Hair Growth on Extremity: [Right:No] Temperature of Extremity: [Right:Warm] Capillary Refill: [Right:< 3 seconds] Toe Nail Assessment Left: Right: Thick: Yes Discolored: Yes Deformed: Yes Improper Length and Hygiene: Yes Electronic Signature(s) Signed: 03/15/2018 4:05:05 PM By: Secundino Ginger Entered By: Secundino Ginger on 03/15/2018 10:10:56 [name]  ([id number]) -------------------------------------------------------------------------------- Multi Wound Chart Details Patient Name: Jesse Floor A. Date of Service: 03/15/2018 10:00 AM Medical Record Number: [id number] Patient Account Number: [id number] Date of Birth/Sex: February 24, 1935 (83 y.o. M) Treating RN: Cornell Barman Primary Care Rayvon Dakin: Katheren Shams Other Clinician: Referring Mehlani Blankenburg: Katheren Shams Treating Naesha Buckalew/Extender: Tito Dine in Treatment: 6 Vital Signs Height(in): 67 Pulse(bpm): 98 Weight(lbs): 167 Blood Pressure(mmHg): 149/85 Body Mass Index(BMI): 26 Temperature(F): 98.0 Respiratory Rate 16 (breaths/min): Photos: [N/A:N/A] Wound Location: Right, Medial Lower Leg N/A N/A Wounding Event: Gradually Appeared N/A N/A Primary Etiology: Diabetic Wound/Ulcer of the N/A N/A Lower Extremity Date Acquired: 10/22/2017 N/A N/A  Weeks of Treatment: 6 N/A N/A Wound Status: Open N/A N/A Measurements L x W x D 0.7x0.6x0.2 N/A N/A (cm) Area (cm) : 0.33 N/A N/A Volume (cm) : 0.066 N/A N/A % Reduction in Area: 89.40% N/A N/A % Reduction in Volume: 89.40% N/A N/A Classification: Grade 2 N/A N/A Debridement: Debridement - Excisional N/A N/A Pre-procedure 10:19 N/A N/A Verification/Time Out Taken: Pain Control: Lidocaine N/A N/A Tissue Debrided: Subcutaneous N/A N/A Level: Skin/Subcutaneous Tissue N/A N/A Debridement Area (sq cm): 0.42 N/A N/A Instrument: Curette N/A N/A Bleeding: Minimum N/A N/A Hemostasis Achieved: Pressure N/A N/A Debridement Treatment Procedure was tolerated well N/A N/A Response: Post Debridement 0.7x0.6x0.2 N/A N/A Measurements L x W x D (cm) [name], [name] ([id number]) Post Debridement Volume: 0.066 N/A N/A (cm) Periwound Skin Texture: No Abnormalities Noted N/A N/A Periwound Skin Moisture: No Abnormalities Noted N/A N/A Periwound Skin Color: No Abnormalities Noted N/A N/A Tenderness on Palpation: No  N/A N/A Procedures Performed: Debridement N/A N/A Treatment Notes Wound #2 (Right, Medial Lower Leg) 4. Dressing Applied: Prisma Ag 5. Secondary Dressing Applied Gauze and Kerlix/Conform Electronic Signature(s) Signed: 03/15/2018 4:09:45 PM By: Linton Ham MD Entered By: Linton Ham on 03/15/2018 10:25:51 [name] ([id number]) -------------------------------------------------------------------------------- Multi-Disciplinary Care Plan Details Patient Name: Jesse Floor A. Date of Service: 03/15/2018 10:00 AM Medical Record Number: [id number] Patient Account Number: [id number] Date of Birth/Sex: 1934-07-03 (83 y.o. M) Treating RN: Cornell Barman Primary Care Shakeera Rightmyer: Katheren Shams Other Clinician: Referring Desree Leap: Katheren Shams Treating Rankin Coolman/Extender: Tito Dine in Treatment: 6 Active Inactive ` Abuse / Safety / Falls / Self Care Management Nursing Diagnoses: Impaired physical mobility Goals: Patient will remain injury free related to falls Date Initiated: 02/01/2018 Target Resolution Date: 04/28/2018 Goal Status: Active Interventions: Assess fall risk on admission and as needed Notes: ` Orientation to the Wound Care Program Nursing Diagnoses: Knowledge deficit related to the wound healing center program Goals: Patient/caregiver will verbalize understanding of the [facility] Date Initiated: 02/01/2018 Target Resolution Date: 04/29/2018 Goal Status: Active Interventions: Provide education on orientation to the wound center Notes: ` Wound/Skin Impairment Nursing Diagnoses: Impaired tissue integrity Goals: Ulcer/skin breakdown will heal within 14 weeks Date Initiated: 02/01/2018 Target Resolution Date: 04/29/2018 Goal Status: Active Interventions: [name], [name] ([id number]) Assess patient/caregiver ability to obtain necessary supplies Assess patient/caregiver ability to perform ulcer/skin care  regimen upon admission and as needed Assess ulceration(s) every visit Notes: Electronic Signature(s) Signed: 03/15/2018 4:45:51 PM By: Gretta Cool, BSN, RN, CWS, Kim RN, BSN Entered By: Gretta Cool, BSN, RN, CWS, Kim on 03/15/2018 10:18:54 [name] ([id number]) -------------------------------------------------------------------------------- Pain Assessment Details Patient Name: Jesse Floor A. Date of Service: 03/15/2018 10:00 AM Medical Record Number: [id number] Patient Account Number: [id number] Date of Birth/Sex: Nov 21, 1934 (83 y.o. M) Treating RN: Secundino Ginger Primary Care Rosaura Bolon: Katheren Shams Other Clinician: Referring Jariyah Hackley: Katheren Shams Treating Mamoudou Mulvehill/Extender: Tito Dine in Treatment: 6 Active Problems Location of Pain Severity and Description of Pain Patient Has Paino No Site Locations Pain Management and Medication Current Pain Management: Electronic Signature(s) Signed: 03/15/2018 4:05:05 PM By: Secundino Ginger Entered By: Secundino Ginger on 03/15/2018 10:04:58 [name] ([id number]) -------------------------------------------------------------------------------- Patient/Caregiver Education Details Patient Name: Jesse Floor A. Date of Service: 03/15/2018 10:00 AM Medical Record Number: [id number] Patient Account Number: [id number] Date of Birth/Gender: [birth year] (83 y.o. M) Treating RN: Cornell Barman Primary Care Physician: Katheren Shams Other Clinician: Referring Physician: Katheren Shams Treating Physician/Extender: Tito Dine in Treatment: 6 Education Assessment Education Provided To:  Patient Education Topics Provided Wound/Skin Impairment: Handouts: Caring for Your Ulcer Methods: Demonstration, Explain/Verbal Responses: State content correctly Electronic Signature(s) Signed: 03/15/2018 4:45:51 PM By: Gretta Cool, BSN, RN, CWS, Kim RN, BSN Entered By: Gretta Cool, BSN, RN, CWS, Kim on 03/15/2018  10:23:44 [name] ([id number]) -------------------------------------------------------------------------------- Wound Assessment Details Patient Name: Jesse Floor A. Date of Service: 03/15/2018 10:00 AM Medical Record Number: [id number] Patient Account Number: [id number] Date of Birth/Sex: 1935-05-23 (83 y.o. M) Treating RN: Secundino Ginger Primary Care Janessa Mickle: Katheren Shams Other Clinician: Referring Maryrose Colvin: Katheren Shams Treating Ednah Hammock/Extender: Tito Dine in Treatment: 6 Wound Status Wound Number: 2 Primary Diabetic Wound/Ulcer of the Lower Etiology: Extremity Wound Location: Right, Medial Lower Leg Wound Status: Open Wounding Event: Gradually Appeared Date Acquired: 10/22/2017 Weeks Of Treatment: 6 Clustered Wound: No Photos Photo Uploaded By: Secundino Ginger on 03/15/2018 10:14:54 Wound Measurements Length: (cm) 0.7 Width: (cm) 0.6 Depth: (cm) 0.2 Area: (cm) 0.33 Volume: (cm) 0.066 % Reduction in Area: 89.4% % Reduction in Volume: 89.4% Wound Description Classification: Grade 2 Periwound Skin Texture Texture Color No Abnormalities Noted: No No Abnormalities Noted: No Moisture No Abnormalities Noted: No Treatment Notes Wound #2 (Right, Medial Lower Leg) 4. Dressing Applied: Prisma Ag 5. Secondary Dressing Applied Gauze and Kerlix/Conform [name], [name] ([id number]) Electronic Signature(s) Signed: 03/15/2018 4:05:05 PM By: Secundino Ginger Entered By: Secundino Ginger on 03/15/2018 10:08:25 [name] ([id number]) -------------------------------------------------------------------------------- Vitals Details Patient Name: Jesse Floor A. Date of Service: 03/15/2018 10:00 AM Medical Record Number: [id number] Patient Account Number: [id number] Date of Birth/Sex: 11/17/1934 (83 y.o. M) Treating RN: Secundino Ginger Primary Care Sameerah Nachtigal: Katheren Shams Other Clinician: Referring Marie Chow: Katheren Shams Treating  Jordynne Mccown/Extender: Tito Dine in Treatment: 6 Vital Signs Time Taken: 10:02 Temperature (F): 98.0 Height (in): 67 Pulse (bpm): 98 Weight (lbs): 167 Respiratory Rate (breaths/min): 16 Body Mass Index (BMI): 26.2 Blood Pressure (mmHg): 149/85 Reference Range: 80 - 120 mg / dl Electronic Signature(s) Signed: 03/15/2018 4:05:05 PM By: Secundino Ginger Entered By: Secundino Ginger on 03/15/2018 10:09:03

## 2018-03-21 DIAGNOSIS — Z8673 Personal history of transient ischemic attack (TIA), and cerebral infarction without residual deficits: Secondary | ICD-10-CM | POA: Insufficient documentation

## 2018-03-22 ENCOUNTER — Encounter: Payer: Medicare Other | Admitting: Internal Medicine

## 2018-03-22 DIAGNOSIS — E11622 Type 2 diabetes mellitus with other skin ulcer: Secondary | ICD-10-CM | POA: Diagnosis not present

## 2018-03-25 NOTE — Progress Notes (Signed)
[name] ([id number]) Visit Report for 03/22/2018 Arrival Information Details Patient Name: [name], [name] Date of Service: 03/22/2018 2:15 PM Medical Record Number: [id number] Patient Account Number: [id number] Date of Birth/Sex: 1935-05-09 (83 y.o. M) Treating RN: Cornell Barman Primary Care Christiann Hagerty: Katheren Shams Other Clinician: Referring Georg Ang: Katheren Shams Treating Lonnell Chaput/Extender: Tito Dine in Treatment: 7 Visit Information History Since Last Visit Added or deleted any medications: No Patient Arrived: Cane Any new allergies or adverse reactions: No Arrival Time: 14:14 Had a fall or experienced change in Yes Accompanied By: spouse activities of daily living that may affect Transfer Assistance: None risk of falls: Patient Identification Verified: Yes Signs or symptoms of abuse/neglect since last visito No Secondary Verification Process Yes Hospitalized since last visit: No Completed: Implantable device outside of the clinic excluding No Patient Has Alerts: Yes cellular tissue based products placed in the center Patient Alerts: 01-27-18 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 Date of Service: 03/22/2018 2:15 PM Medical Record Number: [id number] Patient Account Number: [id number] Date of Birth/Sex: [birth year] (83 y.o. M) Treating RN: Cornell Barman Primary Care Telissa Palmisano: Katheren Shams Other Clinician: Referring Ignacia Gentzler: Katheren Shams Treating Arva Slaugh/Extender: Tito Dine in  Treatment: 7 Encounter Discharge Information Items Post Procedure Vitals Discharge Condition: Stable Temperature (F): 97.8 Ambulatory Status: Cane Pulse (bpm): 73 Discharge Destination: Home Respiratory Rate (breaths/min): 16 Transportation: Private Auto Blood Pressure (mmHg): 115/71 Accompanied By: self Schedule Follow-up Appointment: Yes Clinical Summary of Care: Electronic Signature(s) Signed: 03/23/2018 7:26:08 AM By: Gretta Cool, BSN, RN, CWS, Kim RN, BSN Entered By: Gretta Cool, BSN, RN, CWS, Kim on 03/22/2018 14:59:54 [name] ([id number]) -------------------------------------------------------------------------------- Lower Extremity Assessment Details Patient Name: Jesse Floor A. Date of Service: 03/22/2018 2:15 PM Medical Record Number: [id number] Patient Account Number: [id number] Date of Birth/Sex: 09/11/1934 (83 y.o. M) Treating RN: Secundino Ginger Primary Care Khadija Thier: Katheren Shams Other Clinician: Referring Elchonon Maxson: Katheren Shams Treating Rayann Jolley/Extender: Tito Dine in Treatment: 7 Edema Assessment Assessed: [Left: No] [Right: No] [Left: Edema] [Right: :] Calf Left: Right: Point of Measurement: 32 cm From Medial Instep cm cm Ankle Left: Right: Point of Measurement: 10 cm From Medial Instep cm cm Vascular Assessment Claudication: Claudication Assessment [Right:None] Pulses: Dorsalis Pedis Palpable: [Right:Yes] Posterior Tibial Extremity colors, hair growth, and conditions: Extremity Color: [Right:Normal] Hair Growth on Extremity: [Right:No] Temperature of Extremity: [Right:Warm] Capillary Refill: [Right:< 3 seconds] Toe Nail Assessment Left: Right: Thick: Yes Discolored: Yes Deformed: Yes Improper Length and Hygiene: No Electronic Signature(s) Signed: 03/22/2018 3:57:54 PM By: Secundino Ginger Entered By: Secundino Ginger on 03/22/2018 14:29:03 [name], [name]  ([id number]) -------------------------------------------------------------------------------- Multi Wound Chart Details Patient Name: Jesse Floor A. Date of Service: 03/22/2018 2:15 PM Medical Record Number: [id number] Patient Account Number: [id number] Date of Birth/Sex: 23-Aug-1934 (83 y.o. M) Treating RN: Cornell Barman Primary Care Radiah Lubinski: Katheren Shams Other Clinician: Referring Rillie Riffel: Katheren Shams Treating Arryana Tolleson/Extender: Tito Dine in Treatment: 7 Vital Signs Height(in): 67 Pulse(bpm): 73 Weight(lbs): 167 Blood Pressure(mmHg): 115/71 Body Mass Index(BMI): 26 Temperature(F): 97.8 Respiratory Rate 16 (breaths/min): Photos: [2:No Photos] [N/A:N/A] Wound Location: [2:Right Lower Leg - Medial] [N/A:N/A] Wounding Event: [2:Gradually Appeared] [N/A:N/A] Primary Etiology: [2:Diabetic Wound/Ulcer of the Lower Extremity] [N/A:N/A] Comorbid History: [  2:Cataracts, Sleep Apnea, Arrhythmia, Congestive Heart Failure, Hypertension, Peripheral Arterial Disease, Type II Diabetes, Osteoarthritis] [N/A:N/A] Date Acquired: [2:10/22/2017] [N/A:N/A] Weeks of Treatment: [2:7] [N/A:N/A] Wound Status: [2:Open] [N/A:N/A] Measurements L x W x D [2:0.4x0.4x0.1] [N/A:N/A] (cm) Area (cm) : [2:0.126] [N/A:N/A] Volume (cm) : [2:0.013] [N/A:N/A] % Reduction in Area: [2:95.90%] [N/A:N/A] % Reduction in Volume: [2:97.90%] [N/A:N/A] Classification: [2:Grade 2] [N/A:N/A] Exudate Amount: [2:Small] [N/A:N/A] Exudate Type: [2:Serous] [N/A:N/A] Exudate Color: [2:amber] [N/A:N/A] Granulation Amount: [2:None Present (0%)] [N/A:N/A] Necrotic Amount: [2:Small (1-33%)] [N/A:N/A] Necrotic Tissue: [2:Eschar] [N/A:N/A] Exposed Structures: [2:Fat Layer (Subcutaneous Tissue) Exposed: Yes Fascia: No Tendon: No Muscle: No Joint: No Bone: No] [N/A:N/A] Debridement: [2:Debridement - Selective/Open Wound 14:50] [N/A:N/A N/A] Pre-procedure Verification/Time Out Taken: Pain  Control: Lidocaine N/A N/A Level: Skin/Epidermis N/A N/A Debridement Area (sq cm): 0.16 N/A N/A Instrument: Curette N/A N/A Bleeding: None N/A N/A Debridement Treatment Procedure was tolerated well N/A N/A Response: Post Debridement 0.4x0.4x0.2 N/A N/A Measurements L x W x D (cm) Post Debridement Volume: 0.025 N/A N/A (cm) Periwound Skin Texture: Excoriation: No N/A N/A Induration: No Callus: No Crepitus: No Rash: No Scarring: No Periwound Skin Moisture: Maceration: No N/A N/A Dry/Scaly: No Periwound Skin Color: Atrophie Blanche: No N/A N/A Cyanosis: No Ecchymosis: No Erythema: No Hemosiderin Staining: No Mottled: No Pallor: No Rubor: No Tenderness on Palpation: No N/A N/A Wound Preparation: Ulcer Cleansing: N/A N/A Rinsed/Irrigated with Saline Topical Anesthetic Applied: Other: lidocaine 4% Procedures Performed: Debridement N/A N/A Treatment Notes Wound #2 (Right, Medial Lower Leg) Notes Alginate with Silver, TCA. gauze and tape Electronic Signature(s) Signed: 03/22/2018 5:32:05 PM By: Linton Ham MD Entered By: Linton Ham on 03/22/2018 [name], Jesse View A. ([id number]) -------------------------------------------------------------------------------- Multi-Disciplinary Care Plan Details Patient Name: Jesse Floor A. Date of Service: 03/22/2018 2:15 PM Medical Record Number: [id number] Patient Account Number: [id number] Date of Birth/Sex: 1935/01/12 (83 y.o. M) Treating RN: Cornell Barman Primary Care Lianah Peed: Katheren Shams Other Clinician: Referring Marchetta Navratil: Katheren Shams Treating Chandani Rogowski/Extender: Tito Dine in Treatment: 7 Active Inactive ` Abuse / Safety / Falls / Self Care Management Nursing Diagnoses: Impaired physical mobility Goals: Patient will remain injury free related to falls Date Initiated: 02/01/2018 Target Resolution Date: 04/28/2018 Goal Status: Active Interventions: Assess fall risk on  admission and as needed Notes: ` Orientation to the Wound Care Program Nursing Diagnoses: Knowledge deficit related to the wound healing center program Goals: Patient/caregiver will verbalize understanding of the [facility] Program Date Initiated: 02/01/2018 Target Resolution Date: 04/29/2018 Goal Status: Active Interventions: Provide education on orientation to the wound center Notes: ` Wound/Skin Impairment Nursing Diagnoses: Impaired tissue integrity Goals: Ulcer/skin breakdown will heal within 14 weeks Date Initiated: 02/01/2018 Target Resolution Date: 04/29/2018 Goal Status: Active Interventions: [name], [name] ([id number]) Assess patient/caregiver ability to obtain necessary supplies Assess patient/caregiver ability to perform ulcer/skin care regimen upon admission and as needed Assess ulceration(s) every visit Notes: Electronic Signature(s) Signed: 03/23/2018 7:26:08 AM By: Gretta Cool, BSN, RN, CWS, Kim RN, BSN Entered By: Gretta Cool, BSN, RN, CWS, Kim on 03/22/2018 14:49:20 [name] ([id number]) -------------------------------------------------------------------------------- Pain Assessment Details Patient Name: Jesse Floor A. Date of Service: 03/22/2018 2:15 PM Medical Record Number: [id number] Patient Account Number: [id number] Date of Birth/Sex: 28-Dec-1934 (83 y.o. M) Treating RN: Cornell Barman Primary Care Eupha Lobb: Katheren Shams Other Clinician: Referring Brees Hounshell: Katheren Shams Treating Hassie Mandt/Extender: Tito Dine in Treatment: 7 Active Problems Location of Pain Severity and Description of Pain Patient Has Paino No Site Locations Pain Management and Medication Current Pain Management: Electronic  Signature(s) Signed: 03/22/2018 4:40:44 PM By: Paulla Fore, RRT, CHT Signed: 03/23/2018 7:26:08 AM By: Gretta Cool, BSN, RN, CWS, Kim RN, BSN Entered By: Lorine Bears on 03/22/2018  14:16:19 [name] ([id number]) -------------------------------------------------------------------------------- Patient/Caregiver Education Details Patient Name: Jesse Floor A. Date of Service: 03/22/2018 2:15 PM Medical Record Number: [id number] Patient Account Number: [id number] Date of Birth/Gender: 02-17-1935 (83 y.o. M) Treating RN: Cornell Barman Primary Care Physician: Katheren Shams Other Clinician: Referring Physician: Katheren Shams Treating Physician/Extender: Tito Dine in Treatment: 7 Education Assessment Education Provided To: Patient Education Topics Provided Wound/Skin Impairment: Handouts: Caring for Your Ulcer, Other: continue wound care as prescribed Methods: Demonstration, Explain/Verbal Responses: State content correctly Electronic Signature(s) Signed: 03/23/2018 7:26:08 AM By: Gretta Cool, BSN, RN, CWS, Kim RN, BSN Entered By: Gretta Cool, BSN, RN, CWS, Kim on 03/22/2018 14:53:08 [name] ([id number]) -------------------------------------------------------------------------------- Wound Assessment Details Patient Name: Jesse Floor A. Date of Service: 03/22/2018 2:15 PM Medical Record Number: [id number] Patient Account Number: [id number] Date of Birth/Sex: 1935/04/16 (83 y.o. M) Treating RN: Secundino Ginger Primary Care Jhovany Weidinger: Katheren Shams Other Clinician: Referring Zelma Snead: Katheren Shams Treating Ajeet Casasola/Extender: Tito Dine in Treatment: 7 Wound Status Wound Number: 2 Primary Diabetic Wound/Ulcer of the Lower Extremity Etiology: Wound Location: Right Lower Leg - Medial Wound Open Wounding Event: Gradually Appeared Status: Date Acquired: 10/22/2017 Comorbid Cataracts, Sleep Apnea, Arrhythmia, Congestive Weeks Of Treatment: 7 History: Heart Failure, Hypertension, Peripheral Arterial Clustered Wound: No Disease, Type II Diabetes, Osteoarthritis Wound Measurements Length: (cm)  0.4 Width: (cm) 0.4 Depth: (cm) 0.1 Area: (cm) 0.126 Volume: (cm) 0.013 % Reduction in Area: 95.9% % Reduction in Volume: 97.9% Tunneling: No Undermining: No Wound Description Classification: Grade 2 Foul Odo Exudate Amount: Small Slough/F Exudate Type: Serous Exudate Color: amber r After Cleansing: No ibrino No Wound Bed Granulation Amount: None Present (0%) Exposed Structure Necrotic Amount: Small (1-33%) Fascia Exposed: No Necrotic Quality: Eschar Fat Layer (Subcutaneous Tissue) Exposed: Yes Tendon Exposed: No Muscle Exposed: No Joint Exposed: No Bone Exposed: No Periwound Skin Texture Texture Color No Abnormalities Noted: No No Abnormalities Noted: No Callus: No Atrophie Blanche: No Crepitus: No Cyanosis: No Excoriation: No Ecchymosis: No Induration: No Erythema: No Rash: No Hemosiderin Staining: No Scarring: No Mottled: No Pallor: No Moisture Rubor: No No Abnormalities Noted: No Dry / Scaly: No Maceration: No Much, Terrell A. ([id number]) Wound Preparation Ulcer Cleansing: Rinsed/Irrigated with Saline Topical Anesthetic Applied: Other: lidocaine 4%, Treatment Notes Wound #2 (Right, Medial Lower Leg) Notes Alginate with Silver, TCA. gauze and tape Electronic Signature(s) Signed: 03/22/2018 3:57:54 PM By: Secundino Ginger Entered By: Secundino Ginger on 03/22/2018 14:28:10 Chiusano, [name] ([id number]) -------------------------------------------------------------------------------- [place] Details Patient Name: Jesse Floor A. Date of Service: 03/22/2018 2:15 PM Medical Record Number: [id number] Patient Account Number: [id number] Date of Birth/Sex: 1935/02/19 (83 y.o. M) Treating RN: Cornell Barman Primary Care Parthiv Mucci: Katheren Shams Other Clinician: Referring Leiya Keesey: Katheren Shams Treating Gracelynn Bircher/Extender: Tito Dine in Treatment: 7 Vital Signs Time Taken: 14:15 Temperature (F): 97.8 Height (in): 67 Pulse (bpm):  73 Weight (lbs): 167 Respiratory Rate (breaths/min): 16 Body Mass Index (BMI): 26.2 Blood Pressure (mmHg): 115/71 Reference Range: 80 - 120 mg / dl Electronic Signature(s) Signed: 03/22/2018 4:40:44 PM By: Lorine Bears RCP, RRT, CHT Entered By: Lorine Bears on 03/22/2018 14:19:00

## 2018-03-25 NOTE — Progress Notes (Signed)
[name] ([id number]) Visit Report for 03/22/2018 Debridement Details Patient Name: [name], [name] Date of Service: 03/22/2018 2:15 PM Medical Record Number: [id number] Patient Account Number: [id number] Date of Birth/Sex: [birth year] (83 y.o. M) Treating RN: Cornell Barman Primary Care Provider: Katheren Shams Other Clinician: Referring Provider: Katheren Shams Treating Provider/Extender: Tito Dine in Treatment: 7 Debridement Performed for Wound #2 Right,Medial Lower Leg Assessment: Performed By: Physician Ricard Dillon, MD Debridement Type: Debridement Severity of Tissue Pre Fat layer exposed Debridement: Level of Consciousness (Pre- Awake and Alert procedure): Pre-procedure Verification/Time Yes - 14:50 Out Taken: Start Time: 14:50 Pain Control: Lidocaine Total Area Debrided (L x W): 0.4 (cm) x 0.4 (cm) = 0.16 (cm) Tissue and other material Viable, Skin: Epidermis debrided: Level: Skin/Epidermis Debridement Description: Selective/Open Wound Instrument: Curette Bleeding: None End Time: 14:53 Response to Treatment: Procedure was tolerated well Level of Consciousness Awake and Alert (Post-procedure): Post Debridement Measurements of Total Wound Length: (cm) 0.4 Width: (cm) 0.4 Depth: (cm) 0.2 Volume: (cm) 0.025 Character of Wound/Ulcer Post Debridement: Stable Severity of Tissue Post Debridement: Fat layer exposed Post Procedure Diagnosis Same as Pre-procedure Electronic Signature(s) Signed: 03/22/2018 5:32:05 PM By: Linton Ham MD Signed: 03/23/2018 7:26:08 AM By: Gretta Cool, BSN, RN, CWS, Kim RN, BSN Entered By: Linton Ham on 03/22/2018 16:40:22 [place], [place] AMarland Kitchen ([id number]) -------------------------------------------------------------------------------- HPI Details Patient Name: Jesse Floor A. Date of Service: 03/22/2018 2:15 PM Medical Record Number: [id number] Patient Account Number: [id number] Date of  Birth/Sex: [birth year] (83 y.o. M) Treating RN: Cornell Barman Primary Care Provider: Katheren Shams Other Clinician: Referring Provider: Katheren Shams Treating Provider/Extender: Tito Dine in Treatment: 7 History of Present Illness Location: Left foot surgical site in the distal/lateral region Quality: Patient describes the pain a sore/burning sensation Severity: 3/10 at rest and 8/10 with manipulation Duration: Patientos surgery was on 10/15/16 with Dr. Albertine [name] Context: Patient had an amputation of the left 3rd - 5th toes on 10/15/16 and the surgical site has not healed following Modifying Factors: Currently this has been packed with saline soaked gauze in a wet to dry format Associated Signs and Symptoms: HTN, DM, A-fib, chronic Coumadin use HPI Description: 11/29/16 patient presents today for evaluation concerning a nonhealing surgical wound following amputation of his left third through fifth toes which was performed on 10/15/16. Unfortunately following this amputation the circle side has not closed appropriately and he continues to have a significant amount of discomfort. He did have an x-ray performed on 10/21/16 which showed him being status post D3 toe amputation but otherwise no acute findings. Patient also has venous imaging of the left lower extremity which revealed no evidence of DVT on 10/22/16. His last hemoglobin A 1C was 8.5 and this was on 10/22/16 and prior to his amputation patient did have vascular surgery with Dr. Leotis Pain where he had a catheter placed in the left anterior tibialis/dorsalis pedis artery from the right femoral approach. Percutaneous transluminal angioplasty of the left anterior tibial artery/dorsalis pedis artery was performed. This procedure apparently went very well. Unfortunately his healing just has not been what both Dr. Elvina [name] nor himself were expecting or wanting. The question posed to [place] today is whether hyperbaric  therapy would be of benefit for him. READMISSION 02/01/18 This is now a 83 year old man we had in the clinic over 2 years ago. At that point he was here for evaluation of amputations of several of his toes on his left foot with a nonhealing surgical wound. He was only  here for 1 visit. The patient tells me he went on to have a left below-knee amputation slightly over a year ago. Sometime earlier this year perhaps in April he traumatized his right shin and he has had an open wound here since then. He is already seen Dr. Lucky [name] and vein and vascular. He had noninvasive tests that showed an ABI on the right of 1.23 but a TBI in the right of only 0.27. He had monophasic waveforms at the anterior tibial and biphasic at the posterior tibial. The overall interpretation was that although his resting right ankle brachial index is within normal limits this may be falsely elevated. Furthermore his toe brachial index was quite a bit reduced. According to the last note from vein and vascular on 01/27/18 they discussed angiogram with possible revascularization on the right. He was felt to be reasonably high risk because of his and inability inability to stay still requiring general anesthesia therefore. They told him to think about it and get back to them. He was referred here for review of this wound. As I understand things they've been using Xeroform I'm not clear what they've been putting on this although they did talk about "an ointment". 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 During his admission to hospital in July MRSA was apparently cultured from this wound and he received a course of Doxy. As he has had recent noninvasive arterial studies done on 01/27/18,no ABIs were felt to be necessary here. 02/08/18;  patient arrives in with a better looking wound. Dimensions are smaller. using silver collagen 02/15/18; wound is measuring smaller. Still with adherent surface material requiring debridement. We've been using silver collagen 02/22/18; wound is measuring smaller. No debridement is required. We're using silver collagen under compression. 03/01/18; continued improvement in wound dimensions. No debridement is required. We've been using silver collagen under compression 03/08/18; wound stalled in terms of dimensions this week. Surface of the wound still looks healthy we've been using silver collagen under compression he has home health changing this. Change to [facility] today Lipsey, Aldwin A. ([id number]) 03/15/18; inadvertently put silver collagen on the wound today. In general his wound continues to contract. Using border foam cover 03/22/18; changed a dressing to calcium alginate. Wound on the right leg is just about closed. He continues to have irritated hyperkeratotic skin around the wound. He claims he did not have a skin lesion in this area that the wound was caused by trauma although the history is date Electronic Signature(s) Signed: 03/22/2018 5:32:05 PM By: Linton Ham MD Entered By: Linton Ham on 03/22/2018 14:59:59 [name] ([id number]) -------------------------------------------------------------------------------- Physical Exam Details Patient Name: Jesse Floor A. Date of Service: 03/22/2018 2:15 PM Medical Record Number: [id number] Patient Account Number: [id number] Date of Birth/Sex: [birth year] (83 y.o. M) Treating RN: Cornell Barman Primary Care Provider: Katheren Shams Other Clinician: Referring Provider: Katheren Shams Treating Provider/Extender: Tito Dine in Treatment: 7 Constitutional Sitting or standing Blood Pressure is within target range for patient.. Pulse regular and within target range for patient.Marland Kitchen Respirations  regular, non-labored and within target range.. Temperature is normal and within the target range for the patient.Marland Kitchen appears in no distress. Notes Wound exam; area on the right anterior tibia. This is better. Nonviable surrounding skin removed with a #5 curet. Wound  surface just about looks fully epithelialized. He continues to have thick dry skin around the wound area with some scaling and erythema. This looks like some form of chronic contact dermatitis. He claims not to have a chronic skin condition in this area. Electronic Signature(s) Signed: 03/22/2018 5:32:05 PM By: Linton Ham MD Entered By: Linton Ham on 03/22/2018 15:01:30 [name] ([id number]) -------------------------------------------------------------------------------- Physician Orders Details Patient Name: Jesse Floor A. Date of Service: 03/22/2018 2:15 PM Medical Record Number: [id number] Patient Account Number: [id number] Date of Birth/Sex: 09-Nov-1934 (83 y.o. M) Treating RN: Cornell Barman Primary Care Provider: Katheren Shams Other Clinician: Referring Provider: Katheren Shams Treating Provider/Extender: Tito Dine in Treatment: 7 Verbal / Phone Orders: No Diagnosis Coding Wound Cleansing Wound #2 Right,Medial Lower Leg o May Shower, gently pat wound dry prior to applying new dressing. Anesthetic (add to Medication List) Wound #2 Right,Medial Lower Leg o Topical Lidocaine 4% cream applied to wound bed prior to debridement (In Clinic Only). Skin Barriers/Peri-Wound Care o Triamcinolone Acetonide Ointment (TCA) - Apply liberally Primary Wound Dressing Wound #2 Right,Medial Lower Leg o Silver Alginate Secondary Dressing Wound #2 Right,Medial Lower Leg o Conform/Kerlix Dressing Change Frequency Wound #2 Right,Medial Lower Leg o Change Dressing Monday, Wednesday, Friday Follow-up Appointments Wound #2 Right,Medial Lower Leg o Return Appointment in 1  week. Edema Control Wound #2 Right,Medial Lower Leg o Elevate legs to the level of the heart and pump ankles as often as possible Patient Medications Allergies: Iodinated Contrast- Oral and IV Dye, latex Notifications Medication Indication Start End triamcinolone acetonide 03/21/2018 DOSE topical 0.1 % cream - cream topical to periwound with dressing changes Electronic Signature(s) RAYSEAN, GRAUMANN ([id number]) Signed: 03/22/2018 3:06:34 PM By: Linton Ham MD Entered By: Linton Ham on 03/22/2018 15:06:33 [name] ([id number]) -------------------------------------------------------------------------------- Problem List Details Patient Name: Jesse Floor A. Date of Service: 03/22/2018 2:15 PM Medical Record Number: [id number] Patient Account Number: [id number] Date of Birth/Sex: Jul 01, 1934 (83 y.o. M) Treating RN: Cornell Barman Primary Care Provider: Katheren Shams Other Clinician: Referring Provider: Katheren Shams Treating Provider/Extender: Tito Dine in Treatment: 7 Active Problems ICD-10 Evaluated Encounter Code Description Active Date Today Diagnosis L97.212 Non-pressure chronic ulcer of right calf with fat layer exposed 02/01/2018 No Yes E11.622 Type 2 diabetes mellitus with other skin ulcer 02/01/2018 No Yes E11.51 Type 2 diabetes mellitus with diabetic peripheral angiopathy 02/01/2018 No Yes without gangrene Inactive Problems Resolved Problems Electronic Signature(s) Signed: 03/22/2018 5:32:05 PM By: Linton Ham MD Entered By: Linton Ham on 03/22/2018 16:39:56 [place], WeaverMarland Kitchen ([id number]) -------------------------------------------------------------------------------- Progress Note Details Patient Name: Jesse Floor A. Date of Service: 03/22/2018 2:15 PM Medical Record Number: [id number] Patient Account Number: [id number] Date of Birth/Sex: Apr 19, 1935 (83 y.o. M) Treating RN: Cornell Barman Primary Care  Provider: Katheren Shams Other Clinician: Referring Provider: Katheren Shams Treating Provider/Extender: Tito Dine in Treatment: 7 Subjective History of Present Illness (HPI) The following HPI elements were documented for the patient's wound: Location: Left foot surgical site in the distal/lateral region Quality: Patient describes the pain a sore/burning sensation Severity: 3/10 at rest and 8/10 with manipulation Duration: Patient s surgery was on 10/15/16 with Dr. Albertine [name] Context: Patient had an amputation of the left 3rd - 5th toes on 10/15/16 and the surgical site has not healed following Modifying Factors: Currently this has been packed with saline soaked gauze in a wet to dry format Associated Signs and Symptoms: HTN, DM, A-fib, chronic Coumadin use 11/29/16 patient presents today for evaluation  concerning a nonhealing surgical wound following amputation of his left third through fifth toes which was performed on 10/15/16. Unfortunately following this amputation the circle side has not closed appropriately and he continues to have a significant amount of discomfort. He did have an x-ray performed on 10/21/16 which showed him being status post D3 toe amputation but otherwise no acute findings. Patient also has venous imaging of the left lower extremity which revealed no evidence of DVT on 10/22/16. His last hemoglobin A 1C was 8.5 and this was on 10/22/16 and prior to his amputation patient did have vascular surgery with Dr. Leotis Pain where he had a catheter placed in the left anterior tibialis/dorsalis pedis artery from the right femoral approach. Percutaneous transluminal angioplasty of the left anterior tibial artery/dorsalis pedis artery was performed. This procedure apparently went very well. Unfortunately his healing just has not been what both Dr. Elvina [name] nor himself were expecting or wanting. The question posed to [place] today is whether hyperbaric therapy  would be of benefit for him. READMISSION 02/01/18 This is now a 83 year old man we had in the clinic over 2 years ago. At that point he was here for evaluation of amputations of several of his toes on his left foot with a nonhealing surgical wound. He was only here for 1 visit. The patient tells me he went on to have a left below-knee amputation slightly over a year ago. Sometime earlier this year perhaps in April he traumatized his right shin and he has had an open wound here since then. He is already seen Dr. Lucky [name] and vein and vascular. He had noninvasive tests that showed an ABI on the right of 1.23 but a TBI in the right of only 0.27. He had monophasic waveforms at the anterior tibial and biphasic at the posterior tibial. The overall interpretation was that although his resting right ankle brachial index is within normal limits this may be falsely elevated. Furthermore his toe brachial index was quite a bit reduced. According to the last note from vein and vascular on 01/27/18 they discussed angiogram with possible revascularization on the right. He was felt to be reasonably high risk because of his and inability inability to stay still requiring general anesthesia therefore. They told him to think about it and get back to them. He was referred here for review of this wound. As I understand things they've been using Xeroform I'm not clear what they've been putting on this although they did talk about "an ointment". 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 During his admission to hospital in July MRSA was apparently cultured from this wound and he received a course of Doxy. As he has had recent noninvasive arterial studies done on 01/27/18,no ABIs were felt to be necessary here. 02/08/18; patient  arrives in with a better looking wound. Dimensions are smaller. using silver collagen 02/15/18; wound is measuring smaller. Still with adherent surface material requiring debridement. We've been using silver collagen 02/22/18; wound is measuring smaller. No debridement is required. We're using silver collagen under compression. 03/01/18; continued improvement in wound dimensions. No debridement is required. We've been using silver collagen under Ryker, Terre A. ([id number]) compression 03/08/18; wound stalled in terms of dimensions this week. Surface of the wound still looks healthy we've been using silver collagen under  compression he has home health changing this. Change to Hydrofera Blue today 03/15/18; inadvertently put silver collagen on the wound today. In general his wound continues to contract. Using border foam cover 03/22/18; changed a dressing to calcium alginate. Wound on the right leg is just about closed. He continues to have irritated hyperkeratotic skin around the wound. He claims he did not have a skin lesion in this area that the wound was caused by trauma although the history is date Objective Constitutional Sitting or standing Blood Pressure is within target range for patient.. Pulse regular and within target range for patient.Marland Kitchen Respirations regular, non-labored and within target range.. Temperature is normal and within the target range for the patient.Marland Kitchen appears in no distress. Vitals Time Taken: 2:15 PM, Height: 67 in, Weight: 167 lbs, BMI: 26.2, Temperature: 97.8 F, Pulse: 73 bpm, Respiratory Rate: 16 breaths/min, Blood Pressure: 115/71 mmHg. General Notes: Wound exam; area on the right anterior tibia. This is better. Nonviable surrounding skin removed with a #5 curet. Wound surface just about looks fully epithelialized. He continues to have thick dry skin around the wound area with some scaling and erythema. This looks like some form of chronic contact dermatitis. He  claims not to have a chronic skin condition in this area. Integumentary (Hair, Skin) Wound #2 status is Open. Original cause of wound was Gradually Appeared. The wound is located on the Right,Medial Lower Leg. The wound measures 0.4cm length x 0.4cm width x 0.1cm depth; 0.126cm^2 area and 0.013cm^3 volume. There is Fat Layer (Subcutaneous Tissue) Exposed exposed. There is no tunneling or undermining noted. There is a small amount of serous drainage noted. There is no granulation within the wound bed. There is a small (1-33%) amount of necrotic tissue within the wound bed including Eschar. The periwound skin appearance did not exhibit: Callus, Crepitus, Excoriation, Induration, Rash, Scarring, Dry/Scaly, Maceration, Atrophie Blanche, Cyanosis, Ecchymosis, Hemosiderin Staining, Mottled, Pallor, Rubor, Erythema. Assessment Active Problems ICD-10 Non-pressure chronic ulcer of right calf with fat layer exposed Type 2 diabetes mellitus with other skin ulcer Type 2 diabetes mellitus with diabetic peripheral angiopathy without gangrene Banwart, Wessley A. ([id number]) Procedures Wound #2 Pre-procedure diagnosis of Wound #2 is a Diabetic Wound/Ulcer of the Lower Extremity located on the Right,Medial Lower Leg .Severity of Tissue Pre Debridement is: Fat layer exposed. There was a Selective/Open Wound Skin/Epidermis Debridement with a total area of 0.16 sq cm performed by Ricard Dillon, MD. With the following instrument(s): Curette to remove Viable tissue/material. Material removed includes Skin: Epidermis after achieving pain control using Lidocaine. No specimens were taken. A time out was conducted at 14:50, prior to the start of the procedure. There was no bleeding. The procedure was tolerated well. Post Debridement Measurements: 0.4cm length x 0.4cm width x 0.2cm depth; 0.025cm^3 volume. Character of Wound/Ulcer Post Debridement is stable. Severity of Tissue Post Debridement is: Fat layer  exposed. Post procedure Diagnosis Wound #2: Same as Pre-Procedure Plan Wound Cleansing: Wound #2 Right,Medial Lower Leg: May Shower, gently pat wound dry prior to applying new dressing. Anesthetic (add to Medication List): Wound #2 Right,Medial Lower Leg: Topical Lidocaine 4% cream applied to wound bed prior to debridement (In Clinic Only). Skin Barriers/Peri-Wound Care: Triamcinolone Acetonide Ointment (TCA) - Apply liberally Primary Wound Dressing: Wound #2 Right,Medial Lower Leg: Silver Alginate Secondary Dressing: Wound #2 Right,Medial Lower Leg: Conform/Kerlix Dressing Change Frequency: Wound #2 Right,Medial Lower Leg: Change Dressing Monday, Wednesday, Friday Follow-up Appointments: Wound #2 Right,Medial Lower Leg: Return Appointment in 1 week. Edema  Control: Wound #2 Right,Medial Lower Leg: Elevate legs to the level of the heart and pump ankles as often as possible The following medication(s) was prescribed: triamcinolone acetonide topical 0.1 % cream cream topical to periwound with dressing changes starting 03/21/2018 #1 the patient's wound has improved #2 continues to have scaly rash around the wound area with some degree of erythema. He states he did not have a skin condition in this area prior to the development of the wound which was trauma. I'm going to give him a prescription for triamcinolone cream to put on the skin under the border foam. KARAN, RAMNAUTH A. ([id number]) Electronic Signature(s) Signed: 03/22/2018 5:32:05 PM By: Linton Ham MD Entered By: Linton Ham on 03/22/2018 16:41:54 [place], [name] ([id number]) -------------------------------------------------------------------------------- SuperBill Details Patient Name: Jesse Floor A. Date of Service: 03/22/2018 Medical Record Number: [id number] Patient Account Number: [id number] Date of Birth/Sex: 16-Sep-1934 (83 y.o. M) Treating RN: Cornell Barman Primary Care Provider: Katheren Shams  Other Clinician: Referring Provider: Katheren Shams Treating Provider/Extender: Tito Dine in Treatment: 7 Diagnosis Coding ICD-10 Codes Code Description [phone] Non-pressure chronic ulcer of right calf with fat layer exposed E11.622 Type 2 diabetes mellitus with other skin ulcer E11.51 Type 2 diabetes mellitus with diabetic peripheral angiopathy without gangrene Facility Procedures CPT4 Code: 22575051 Description: 83358 - DEBRIDE WOUND 1ST 20 SQ CM OR < ICD-10 Diagnosis Description L97.212 Non-pressure chronic ulcer of right calf with fat layer expos E11.622 Type 2 diabetes mellitus with other skin ulcer Modifier: ed Quantity: 1 Physician Procedures CPT4 Code: 2518984 Description: 21031 - WC PHYS DEBR WO ANESTH 20 SQ CM ICD-10 Diagnosis Description L97.212 Non-pressure chronic ulcer of right calf with fat layer expos E11.622 Type 2 diabetes mellitus with other skin ulcer Modifier: ed Quantity: 1 Electronic Signature(s) Signed: 03/22/2018 5:32:05 PM By: Linton Ham MD Entered By: Linton Ham on 03/22/2018 16:42:15

## 2018-03-29 ENCOUNTER — Encounter: Payer: Medicare Other | Attending: Internal Medicine | Admitting: Internal Medicine

## 2018-03-29 DIAGNOSIS — E11622 Type 2 diabetes mellitus with other skin ulcer: Secondary | ICD-10-CM | POA: Diagnosis present

## 2018-03-29 DIAGNOSIS — G40909 Epilepsy, unspecified, not intractable, without status epilepticus: Secondary | ICD-10-CM | POA: Insufficient documentation

## 2018-03-29 DIAGNOSIS — I482 Chronic atrial fibrillation, unspecified: Secondary | ICD-10-CM | POA: Diagnosis not present

## 2018-03-29 DIAGNOSIS — I5032 Chronic diastolic (congestive) heart failure: Secondary | ICD-10-CM | POA: Diagnosis not present

## 2018-03-29 DIAGNOSIS — Z79899 Other long term (current) drug therapy: Secondary | ICD-10-CM | POA: Insufficient documentation

## 2018-03-29 DIAGNOSIS — E1151 Type 2 diabetes mellitus with diabetic peripheral angiopathy without gangrene: Secondary | ICD-10-CM | POA: Diagnosis not present

## 2018-03-29 DIAGNOSIS — I110 Hypertensive heart disease with heart failure: Secondary | ICD-10-CM | POA: Diagnosis not present

## 2018-03-29 DIAGNOSIS — I252 Old myocardial infarction: Secondary | ICD-10-CM | POA: Diagnosis not present

## 2018-03-29 DIAGNOSIS — Z7901 Long term (current) use of anticoagulants: Secondary | ICD-10-CM | POA: Diagnosis not present

## 2018-03-29 DIAGNOSIS — Z89512 Acquired absence of left leg below knee: Secondary | ICD-10-CM | POA: Insufficient documentation

## 2018-03-29 DIAGNOSIS — I2510 Atherosclerotic heart disease of native coronary artery without angina pectoris: Secondary | ICD-10-CM | POA: Diagnosis not present

## 2018-03-29 DIAGNOSIS — L97212 Non-pressure chronic ulcer of right calf with fat layer exposed: Secondary | ICD-10-CM | POA: Diagnosis not present

## 2018-04-01 NOTE — Progress Notes (Signed)
Jesse, [name] ([id number]) Visit Report for 03/29/2018 HPI Details Patient Name: Jesse, [name] Date of Service: 03/29/2018 10:15 AM Medical Record Number: [id number] Patient Account Number: [id number] Date of Birth/Sex: 14-Feb-1935 (83 y.o. M) Treating RN: Cornell Barman Primary Care Provider: Katheren Shams Other Clinician: Referring Provider: Katheren Shams Treating Provider/Extender: Tito Dine in Treatment: 8 History of Present Illness Location: Left foot surgical site in the distal/lateral region Quality: Patient describes the pain a sore/burning sensation Severity: 3/10 at rest and 8/10 with manipulation Duration: Patientos surgery was on 10/15/16 with Dr. Albertine Patricia DPM Context: Patient had an amputation of the left 3rd - 5th toes on 10/15/16 and the surgical site has not healed following Modifying Factors: Currently this has been packed with saline soaked gauze in a wet to dry format Associated Signs and Symptoms: HTN, DM, A-fib, chronic Coumadin use HPI Description: 11/29/16 patient presents today for evaluation concerning a nonhealing surgical wound following amputation of his left third through fifth toes which was performed on 10/15/16. Unfortunately following this amputation the circle side has not closed appropriately and he continues to have a significant amount of discomfort. He did have an x-ray performed on 10/21/16 which showed him being status post D3 toe amputation but otherwise no acute findings. Patient also has venous imaging of the left lower extremity which revealed no evidence of DVT on 10/22/16. His last hemoglobin A 1C was 8.5 and this was on 10/22/16 and prior to his amputation patient did have vascular surgery with Dr. Leotis Pain where he had a catheter placed in the left anterior tibialis/dorsalis pedis artery from the right femoral approach. Percutaneous transluminal angioplasty of the left anterior tibial artery/dorsalis pedis artery  was performed. This procedure apparently went very well. Unfortunately his healing just has not been what both Dr. Elvina Mattes nor himself were expecting or wanting. The question posed to [place] today is whether hyperbaric therapy would be of benefit for him. READMISSION 02/01/18 This is now a 83 year old man we had in the clinic over 2 years ago. At that point he was here for evaluation of amputations of several of his toes on his left foot with a nonhealing surgical wound. He was only here for 1 visit. The patient tells me he went on to have a left below-knee amputation slightly over a year ago. Sometime earlier this year perhaps in April he traumatized his right shin and he has had an open wound here since then. He is already seen Dr. Lucky Cowboy and vein and vascular. He had noninvasive tests that showed an ABI on the right of 1.23 but a TBI in the right of only 0.27. He had monophasic waveforms at the anterior tibial and biphasic at the posterior tibial. The overall interpretation was that although his resting right ankle brachial index is within normal limits this may be falsely elevated. Furthermore his toe brachial index was quite a bit reduced. According to the last note from vein and vascular on 01/27/18 they discussed angiogram with possible revascularization on the right. He was felt to be reasonably high risk because of his and inability inability to stay still requiring general anesthesia therefore. They told him to think about it and get back to them. He was referred here for review of this wound. As I understand things they've been using Xeroform I'm not clear what they've been putting on this although they did talk about "an ointment". I wonder whether this was Santyl The patient has had a history of coronary artery  disease with a non-STEMI MI in August, diastolic heart failure, atrial fib, type 2 diabetes with known PAD, left BKA one year ago, hypertension and seizure disorder. During his admission  to hospital in July MRSA was apparently cultured from this wound and he received a course of Doxy. As he has had recent noninvasive arterial studies done on 01/27/18,no ABIs were felt to be necessary here. 02/08/18; patient arrives in with a better looking wound. Dimensions are smaller. using silver collagen 02/15/18; wound is measuring smaller. Still with adherent surface material requiring debridement. We've been using silver collagen 02/22/18; wound is measuring smaller. No debridement is required. We're using silver collagen under compression. 03/01/18; continued improvement in wound dimensions. No debridement is required. We've been using silver collagen under Schewe, Jerrion A. ([id number]) compression 03/08/18; wound stalled in terms of dimensions this week. Surface of the wound still looks healthy we've been using silver collagen under compression he has home health changing this. Change to Hydrofera Blue today 03/15/18; inadvertently put silver collagen on the wound today. In general his wound continues to contract. Using border foam cover 03/22/18; changed a dressing to calcium alginate. Wound on the right leg is just about closed. He continues to have irritated hyperkeratotic skin around the wound. He claims he did not have a skin lesion in this area that the wound was caused by trauma although the history is vague 03/29/18; using calcium alginate. Came in with debris over wound surface although her intake nurse noted drainage. The skin around this looks a lot better with the triamcinolone I prescribed. Electronic Signature(s) Signed: 03/29/2018 4:33:49 PM By: Jesse Ham MD Entered By: [name] on 03/29/2018 10:47:16 [name] ([id number]) -------------------------------------------------------------------------------- Physical Exam Details Patient Name: Jesse Floor A. Date of Service: 03/29/2018 10:15 AM Medical Record Number: [id number] Patient Account Number:  [id number] Date of Birth/Sex: [birth year] (83 y.o. M) Treating RN: Cornell Barman Primary Care Provider: Katheren Shams Other Clinician: Referring Provider: Katheren Shams Treating Provider/Extender: Tito Dine in Treatment: 8 Constitutional Sitting or standing Blood Pressure is within target range for patient.. Pulse regular and within target range for patient.Marland Kitchen Respirations regular, non-labored and within target range.. Temperature is normal and within the target range for the patient.Marland Kitchen appears in no distress. Notes Wound exam; areas on the right anterior tibia. Required some debridement to remove surface nonviable material. Underneath the wound looks healthy and is considerably smaller. The dermatitis type changes in the skin around the wound are improved Electronic Signature(s) Signed: 03/29/2018 4:33:49 PM By: Jesse Ham MD Entered By: [name] on 03/29/2018 10:52:40 [name] ([id number]) -------------------------------------------------------------------------------- Physician Orders Details Patient Name: Jesse Floor A. Date of Service: 03/29/2018 10:15 AM Medical Record Number: [id number] Patient Account Number: [id number] Date of Birth/Sex: 07-07-1934 (83 y.o. M) Treating RN: Cornell Barman Primary Care Provider: Katheren Shams Other Clinician: Referring Provider: Katheren Shams Treating Provider/Extender: Tito Dine in Treatment: 8 Verbal / Phone Orders: No Diagnosis Coding Wound Cleansing Wound #2 Right,Medial Lower Leg o May Shower, gently pat wound dry prior to applying new dressing. Anesthetic (add to Medication List) Wound #2 Right,Medial Lower Leg o Topical Lidocaine 4% cream applied to wound bed prior to debridement (In Clinic Only). Skin Barriers/Peri-Wound Care o Triamcinolone Acetonide Ointment (TCA) - Apply liberally Primary Wound Dressing Wound #2 Right,Medial Lower Leg o Silver  Alginate Secondary Dressing Wound #2 Right,Medial Lower Leg o Conform/Kerlix Dressing Change Frequency Wound #2 Right,Medial Lower Leg o Change Dressing Monday, Wednesday, Friday Follow-up Appointments Wound #  2 Right,Medial Lower Leg o Return Appointment in 1 week. Edema Control Wound #2 Right,Medial Lower Leg o Elevate legs to the level of the heart and pump ankles as often as possible Electronic Signature(s) Signed: 03/29/2018 4:33:49 PM By: Jesse Ham MD Signed: 03/30/2018 10:02:59 AM By: Gretta Cool, BSN, RN, CWS, Kim RN, BSN Entered By: Gretta Cool, BSN, RN, CWS, Kim on 03/29/2018 10:44:12 [name] ([id number]) -------------------------------------------------------------------------------- Problem List Details Patient Name: Jesse Floor A. Date of Service: 03/29/2018 10:15 AM Medical Record Number: [id number] Patient Account Number: [id number] Date of Birth/Sex: 1934-10-18 (83 y.o. M) Treating RN: Cornell Barman Primary Care Provider: Katheren Shams Other Clinician: Referring Provider: Katheren Shams Treating Provider/Extender: Tito Dine in Treatment: 8 Active Problems ICD-10 Evaluated Encounter Code Description Active Date Today Diagnosis L97.212 Non-pressure chronic ulcer of right calf with fat layer exposed 02/01/2018 No Yes E11.622 Type 2 diabetes mellitus with other skin ulcer 02/01/2018 No Yes E11.51 Type 2 diabetes mellitus with diabetic peripheral angiopathy 02/01/2018 No Yes without gangrene Inactive Problems Resolved Problems Electronic Signature(s) Signed: 03/29/2018 4:33:49 PM By: Jesse Ham MD Entered By: [name] on 03/29/2018 10:44:40 [place]. ([id number]) -------------------------------------------------------------------------------- Progress Note Details Patient Name: Jesse Floor A. Date of Service: 03/29/2018 10:15 AM Medical Record Number: [id number] Patient Account Number: [id number] Date  of Birth/Sex: August 17, 1934 (83 y.o. M) Treating RN: Cornell Barman Primary Care Provider: Katheren Shams Other Clinician: Referring Provider: Katheren Shams Treating Provider/Extender: Tito Dine in Treatment: 8 Subjective History of Present Illness (HPI) The following HPI elements were documented for the patient's wound: Location: Left foot surgical site in the distal/lateral region Quality: Patient describes the pain a sore/burning sensation Severity: 3/10 at rest and 8/10 with manipulation Duration: Patient s surgery was on 10/15/16 with Dr. Albertine Patricia DPM Context: Patient had an amputation of the left 3rd - 5th toes on 10/15/16 and the surgical site has not healed following Modifying Factors: Currently this has been packed with saline soaked gauze in a wet to dry format Associated Signs and Symptoms: HTN, DM, A-fib, chronic Coumadin use 11/29/16 patient presents today for evaluation concerning a nonhealing surgical wound following amputation of his left third through fifth toes which was performed on 10/15/16. Unfortunately following this amputation the circle side has not closed appropriately and he continues to have a significant amount of discomfort. He did have an x-ray performed on 10/21/16 which showed him being status post D3 toe amputation but otherwise no acute findings. Patient also has venous imaging of the left lower extremity which revealed no evidence of DVT on 10/22/16. His last hemoglobin A 1C was 8.5 and this was on 10/22/16 and prior to his amputation patient did have vascular surgery with Dr. Leotis Pain where he had a catheter placed in the left anterior tibialis/dorsalis pedis artery from the right femoral approach. Percutaneous transluminal angioplasty of the left anterior tibial artery/dorsalis pedis artery was performed. This procedure apparently went very well. Unfortunately his healing just has not been what both Dr. Elvina Mattes nor himself were  expecting or wanting. The question posed to [place] today is whether hyperbaric therapy would be of benefit for him. READMISSION 02/01/18 This is now a 83 year old man we had in the clinic over 2 years ago. At that point he was here for evaluation of amputations of several of his toes on his left foot with a nonhealing surgical wound. He was only here for 1 visit. The patient tells me he went on to have a left below-knee amputation  slightly over a year ago. Sometime earlier this year perhaps in April he traumatized his right shin and he has had an open wound here since then. He is already seen Dr. Lucky Cowboy and vein and vascular. He had noninvasive tests that showed an ABI on the right of 1.23 but a TBI in the right of only 0.27. He had monophasic waveforms at the anterior tibial and biphasic at the posterior tibial. The overall interpretation was that although his resting right ankle brachial index is within normal limits this may be falsely elevated. Furthermore his toe brachial index was quite a bit reduced. According to the last note from vein and vascular on 01/27/18 they discussed angiogram with possible revascularization on the right. He was felt to be reasonably high risk because of his and inability inability to stay still requiring general anesthesia therefore. They told him to think about it and get back to them. He was referred here for review of this wound. As I understand things they've been using Xeroform I'm not clear what they've been putting on this although they did talk about "an ointment". 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 During his admission to hospital in July MRSA was apparently cultured from this wound and he received a course of Doxy. As he has had recent noninvasive  arterial studies done on 01/27/18,no ABIs were felt to be necessary here. 02/08/18; patient arrives in with a better looking wound. Dimensions are smaller. using silver collagen 02/15/18; wound is measuring smaller. Still with adherent surface material requiring debridement. We've been using silver collagen 02/22/18; wound is measuring smaller. No debridement is required. We're using silver collagen under compression. 03/01/18; continued improvement in wound dimensions. No debridement is required. We've been using silver collagen under Parrott, Adger A. ([id number]) compression 03/08/18; wound stalled in terms of dimensions this week. Surface of the wound still looks healthy we've been using silver collagen under compression he has home health changing this. Change to Hydrofera Blue today 03/15/18; inadvertently put silver collagen on the wound today. In general his wound continues to contract. Using border foam cover 03/22/18; changed a dressing to calcium alginate. Wound on the right leg is just about closed. He continues to have irritated hyperkeratotic skin around the wound. He claims he did not have a skin lesion in this area that the wound was caused by trauma although the history is vague 03/29/18; using calcium alginate. Came in with debris over wound surface although her intake nurse noted drainage. The skin around this looks a lot better with the triamcinolone I prescribed. Objective Constitutional Sitting or standing Blood Pressure is within target range for patient.. Pulse regular and within target range for patient.Marland Kitchen Respirations regular, non-labored and within target range.. Temperature is normal and within the target range for the patient.Marland Kitchen appears in no distress. Vitals Time Taken: 10:15 AM, Height: 67 in, Weight: 167 lbs, BMI: 26.2, Temperature: 97.7 F, Pulse: 69 bpm, Respiratory Rate: 16 breaths/min, Blood Pressure: 135/89 mmHg. General Notes: Wound exam; areas on the right  anterior tibia. Required some debridement to remove surface nonviable material. Underneath the wound looks healthy and is considerably smaller. The dermatitis type changes in the skin around the wound are improved Integumentary (Hair, Skin) Wound #2 status is Open. Original cause of wound was Gradually Appeared. The  wound is located on the Right,Medial Lower Leg. The wound measures 0.2cm length x 0.1cm width x 0.1cm depth; 0.016cm^2 area and 0.002cm^3 volume. There is Fat Layer (Subcutaneous Tissue) Exposed exposed. There is no tunneling or undermining noted. There is a medium amount of serous drainage noted. The wound margin is flat and intact. There is no granulation within the wound bed. There is a small (1-33%) amount of necrotic tissue within the wound bed including Eschar. The periwound skin appearance did not exhibit: Callus, Crepitus, Excoriation, Induration, Rash, Scarring, Dry/Scaly, Maceration, Atrophie Blanche, Cyanosis, Ecchymosis, Hemosiderin Staining, Mottled, Pallor, Rubor, Erythema. Assessment Active Problems ICD-10 Non-pressure chronic ulcer of right calf with fat layer exposed Type 2 diabetes mellitus with other skin ulcer Type 2 diabetes mellitus with diabetic peripheral angiopathy without gangrene Torti, Tarrin A. ([id number]) Plan Wound Cleansing: Wound #2 Right,Medial Lower Leg: May Shower, gently pat wound dry prior to applying new dressing. Anesthetic (add to Medication List): Wound #2 Right,Medial Lower Leg: Topical Lidocaine 4% cream applied to wound bed prior to debridement (In Clinic Only). Skin Barriers/Peri-Wound Care: Triamcinolone Acetonide Ointment (TCA) - Apply liberally Primary Wound Dressing: Wound #2 Right,Medial Lower Leg: Silver Alginate Secondary Dressing: Wound #2 Right,Medial Lower Leg: Conform/Kerlix Dressing Change Frequency: Wound #2 Right,Medial Lower Leg: Change Dressing Monday, Wednesday, Friday Follow-up Appointments: Wound #2  Right,Medial Lower Leg: Return Appointment in 1 week. Edema Control: Wound #2 Right,Medial Lower Leg: Elevate legs to the level of the heart and pump ankles as often as possible #1Continue with silver alginate/Curlex and conform #2 may be closed by next week Electronic Signature(s) Signed: 03/29/2018 4:33:49 PM By: Jesse Ham MD Entered By: [name] on 03/29/2018 10:53:33 [place], [name] ([id number]) -------------------------------------------------------------------------------- SuperBill Details Patient Name: Jesse Floor A. Date of Service: 03/29/2018 Medical Record Number: [id number] Patient Account Number: [id number] Date of Birth/Sex: 1934/06/16 (83 y.o. M) Treating RN: Cornell Barman Primary Care Provider: Katheren Shams Other Clinician: Referring Provider: Katheren Shams Treating Provider/Extender: Tito Dine in Treatment: 8 Diagnosis Coding ICD-10 Codes Code Description [phone] Non-pressure chronic ulcer of right calf with fat layer exposed E11.622 Type 2 diabetes mellitus with other skin ulcer E11.51 Type 2 diabetes mellitus with diabetic peripheral angiopathy without gangrene Facility Procedures CPT4 Code: [id number] Description: [phone] - WOUND CARE VISIT-LEV 2 EST PT Modifier: Quantity: 1 Physician Procedures CPT4 Code: [id number] Description: 38101 - WC PHYS LEVEL 2 - EST PT ICD-10 Diagnosis Description L97.212 Non-pressure chronic ulcer of right calf with fat layer exp E11.622 Type 2 diabetes mellitus with other skin ulcer Modifier: osed Quantity: 1 Electronic Signature(s) Signed: 03/29/2018 4:33:49 PM By: Jesse Ham MD Entered By: [name] on 03/29/2018 10:53:56

## 2018-04-01 NOTE — Progress Notes (Signed)
[name], [name] ([id number]) Visit Report for 03/29/2018 Arrival Information Details Patient Name: [name], [name] Date of Service: 03/29/2018 10:15 AM Medical Record Number: [id number] Patient Account Number: [id number] Date of Birth/Sex: November 25, 1934 (83 y.o. M) Treating RN: Cornell Barman Primary Care Doshie Maggi: Katheren Shams Other Clinician: Referring Giavonni Fonder: Katheren Shams Treating Kelvyn Schunk/Extender: Tito Dine in Treatment: 8 Visit Information History Since Last Visit Added or deleted any medications: No Patient Arrived: Ambulatory Any new allergies or adverse reactions: No Arrival Time: 10:16 Had a fall or experienced change in No Accompanied By: wife activities of daily living that may affect Transfer Assistance: None risk of falls: Patient Identification Verified: Yes Signs or symptoms of abuse/neglect since last visito No Secondary Verification Process Yes Hospitalized since last visit: No Completed: Implantable device outside of the clinic excluding No Patient Has Alerts: Yes cellular tissue based products placed in the center Patient Alerts: 01-27-18 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 Date of Service: 03/29/2018 10:15 AM Medical Record Number: [id number] Patient Account Number: [id number] Date of Birth/Sex: July 12, 1934 (83 y.o. M) Treating RN: Cornell Barman Primary Care Zonnie Landen: Katheren Shams Other Clinician: Referring Krislyn Donnan: Katheren Shams Treating Citlally Captain/Extender: Tito Dine in  Treatment: 8 Clinic Level of Care Assessment Items TOOL 4 Quantity Score []  - Use when only an EandM is performed on FOLLOW-UP visit 0 ASSESSMENTS - Nursing Assessment / Reassessment []  - Reassessment of Co-morbidities (includes updates in patient status) 0 X- 1 5 Reassessment of Adherence to Treatment Plan ASSESSMENTS - Wound and Skin Assessment / Reassessment X - Simple Wound Assessment / Reassessment - one wound 1 5 []  - 0 Complex Wound Assessment / Reassessment - multiple wounds []  - 0 Dermatologic / Skin Assessment (not related to wound area) ASSESSMENTS - Focused Assessment []  - Circumferential Edema Measurements - multi extremities 0 []  - 0 Nutritional Assessment / Counseling / Intervention []  - 0 Lower Extremity Assessment (monofilament, tuning fork, pulses) []  - 0 Peripheral Arterial Disease Assessment (using hand held doppler) ASSESSMENTS - Ostomy and/or Continence Assessment and Care []  - Incontinence Assessment and Management 0 []  - 0 Ostomy Care Assessment and Management (repouching, etc.) PROCESS - Coordination of Care X - Simple Patient / Family Education for ongoing care 1 15 []  - 0 Complex (extensive) Patient / Family Education for ongoing care X- 1 10 Staff obtains Programmer, systems, Records, Test Results / Process Orders []  - 0 Staff telephones HHA, Nursing Homes / Clarify orders / etc []  - 0 Routine Transfer to another Facility (non-emergent condition) []  - 0 Routine Hospital Admission (non-emergent condition) []  - 0 New Admissions / Biomedical engineer / Ordering NPWT, Apligraf, etc. []  - 0 Emergency Hospital Admission (emergent condition) X- 1 10 Simple Discharge Coordination Maugeri, Gio A. ([id number]) []  - 0 Complex (extensive) Discharge Coordination PROCESS - Special Needs []  - Pediatric / Minor Patient Management 0 []  - 0 Isolation Patient Management []  - 0 Hearing / Language / Visual special needs []  - 0 Assessment of Community  assistance (transportation, D/C planning, etc.) []  - 0 Additional assistance / Altered mentation []  - 0 Support Surface(s) Assessment (bed, cushion, seat, etc.) INTERVENTIONS - Wound Cleansing / Measurement X - Simple Wound Cleansing -  one wound 1 5 []  - 0 Complex Wound Cleansing - multiple wounds X- 1 5 Wound Imaging (photographs - any number of wounds) []  - 0 Wound Tracing (instead of photographs) X- 1 5 Simple Wound Measurement - one wound []  - 0 Complex Wound Measurement - multiple wounds INTERVENTIONS - Wound Dressings X - Small Wound Dressing one or multiple wounds 1 10 []  - 0 Medium Wound Dressing one or multiple wounds []  - 0 Large Wound Dressing one or multiple wounds []  - 0 Application of Medications - topical []  - 0 Application of Medications - injection INTERVENTIONS - Miscellaneous []  - External ear exam 0 []  - 0 Specimen Collection (cultures, biopsies, blood, body fluids, etc.) []  - 0 Specimen(s) / Culture(s) sent or taken to Lab for analysis []  - 0 Patient Transfer (multiple staff / Civil Service fast streamer / Similar devices) []  - 0 Simple Staple / Suture removal (25 or less) []  - 0 Complex Staple / Suture removal (26 or more) []  - 0 Hypo / Hyperglycemic Management (close monitor of Blood Glucose) []  - 0 Ankle / Brachial Index (ABI) - do not check if billed separately X- 1 5 Vital Signs [name], [name] ([id number]) Has the patient been seen at the hospital within the last three years: Yes Total Score: 75 Level Of Care: New/Established - Level 2 Electronic Signature(s) Signed: 03/30/2018 10:02:59 AM By: Gretta Cool, BSN, RN, CWS, Kim RN, BSN Entered By: Gretta Cool, BSN, RN, CWS, Kim on 03/29/2018 10:46:09 [name] ([id number]) -------------------------------------------------------------------------------- Encounter Discharge Information Details Patient Name: Jesse Floor A. Date of Service: 03/29/2018 10:15 AM Medical Record Number: [id number] Patient  Account Number: [id number] Date of Birth/Sex: [birth year] (83 y.o. M) Treating RN: Cornell Barman Primary Care Ieisha Gao: Katheren Shams Other Clinician: Referring Lundynn Cohoon: Katheren Shams Treating Lucio Litsey/Extender: Tito Dine in Treatment: 8 Encounter Discharge Information Items Discharge Condition: Stable Ambulatory Status: Cane Discharge Destination: Home Transportation: Private Auto Accompanied By: spouse Schedule Follow-up Appointment: Yes Clinical Summary of Care: Electronic Signature(s) Signed: 03/30/2018 10:02:59 AM By: Gretta Cool, BSN, RN, CWS, Kim RN, BSN Entered By: Gretta Cool, BSN, RN, CWS, Kim on 03/29/2018 10:48:34 [name] ([id number]) -------------------------------------------------------------------------------- Lower Extremity Assessment Details Patient Name: Jesse Floor A. Date of Service: 03/29/2018 10:15 AM Medical Record Number: [id number] Patient Account Number: [id number] Date of Birth/Sex: [birth year] (83 y.o. M) Treating RN: Montey Hora Primary Care Treyshaun Keatts: Katheren Shams Other Clinician: Referring Lynnel Zanetti: Katheren Shams Treating [place]ford Leinen/Extender: Tito Dine in Treatment: 8 Edema Assessment Assessed: [Left: No] [Right: No] [Left: Edema] [Right: :] Calf Left: Right: Point of Measurement: 32 cm From Medial Instep cm cm Ankle Left: Right: Point of Measurement: 10 cm From Medial Instep cm cm Vascular Assessment Pulses: Dorsalis Pedis Palpable: [Right:Yes] Posterior Tibial Extremity colors, hair growth, and conditions: Extremity Color: [Right:Normal] Hair Growth on Extremity: [Right:Yes] Temperature of Extremity: [Right:Cool] Capillary Refill: [Right:< 3 seconds] Toe Nail Assessment Left: Right: Thick: Yes Discolored: Yes Deformed: Yes Improper Length and Hygiene: No Electronic Signature(s) Signed: 03/30/2018 5:01:53 PM By: Montey Hora Entered By: Montey Hora on 03/29/2018  10:28:31 [name] ([id number]) -------------------------------------------------------------------------------- Multi Wound Chart Details Patient Name: Jesse Floor A. Date of Service: 03/29/2018 10:15 AM Medical Record Number: [id number] Patient Account Number: [id number] Date of Birth/Sex: Oct 15, 1934 (83 y.o. M) Treating RN: Cornell Barman Primary Care Burch Marchuk: Katheren Shams Other Clinician: Referring Clora Ohmer: Katheren Shams Treating Joy Haegele/Extender: Tito Dine in Treatment: 8 Vital Signs Height(in): 67 Pulse(bpm): 69 Weight(lbs): 167 Blood Pressure(mmHg): 135/89 Body Mass Index(BMI): 26 Temperature(F):  97.7 Respiratory Rate 16 (breaths/min): Photos: [2:No Photos] [N/A:N/A] Wound Location: [2:Right Lower Leg - Medial] [N/A:N/A] Wounding Event: [2:Gradually Appeared] [N/A:N/A] Primary Etiology: [2:Diabetic Wound/Ulcer of the Lower Extremity] [N/A:N/A] Comorbid History: [2:Cataracts, Sleep Apnea, Arrhythmia, Congestive Heart Failure, Hypertension, Peripheral Arterial Disease, Type II Diabetes, Osteoarthritis] [N/A:N/A] Date Acquired: [2:10/22/2017] [N/A:N/A] Weeks of Treatment: [2:8] [N/A:N/A] Wound Status: [2:Open] [N/A:N/A] Measurements L x W x D [2:0.2x0.1x0.1] [N/A:N/A] (cm) Area (cm) : [2:0.016] [N/A:N/A] Volume (cm) : [2:0.002] [N/A:N/A] % Reduction in Area: [2:99.50%] [N/A:N/A] % Reduction in Volume: [2:99.70%] [N/A:N/A] Classification: [2:Grade 2] [N/A:N/A] Exudate Amount: [2:Medium] [N/A:N/A] Exudate Type: [2:Serous] [N/A:N/A] Exudate Color: [2:amber] [N/A:N/A] Wound Margin: [2:Flat and Intact] [N/A:N/A] Granulation Amount: [2:None Present (0%)] [N/A:N/A] Necrotic Amount: [2:Small (1-33%)] [N/A:N/A] Necrotic Tissue: [2:Eschar] [N/A:N/A] Exposed Structures: [2:Fat Layer (Subcutaneous Tissue) Exposed: Yes Fascia: No Tendon: No Muscle: No Joint: No Bone: No] [N/A:N/A] Epithelialization: [2:None] [N/A:N/A] Periwound Skin  Texture: [N/A:N/A] Excoriation: No Induration: No Callus: No Crepitus: No Rash: No Scarring: No Periwound Skin Moisture: Maceration: No N/A N/A Dry/Scaly: No Periwound Skin Color: Atrophie Blanche: No N/A N/A Cyanosis: No Ecchymosis: No Erythema: No Hemosiderin Staining: No Mottled: No Pallor: No Rubor: No Tenderness on Palpation: No N/A N/A Wound Preparation: Ulcer Cleansing: N/A N/A Rinsed/Irrigated with Saline Topical Anesthetic Applied: Other: lidocaine 4% Treatment Notes Electronic Signature(s) Signed: 03/29/2018 4:33:49 PM By: Linton Ham MD Entered By: Linton Ham on 03/29/2018 10:46:31 [name] ([id number]) -------------------------------------------------------------------------------- Multi-Disciplinary Care Plan Details Patient Name: Jesse Floor A. Date of Service: 03/29/2018 10:15 AM Medical Record Number: [id number] Patient Account Number: [id number] Date of Birth/Sex: [birth year] (83 y.o. M) Treating RN: Cornell Barman Primary Care Maigan Bittinger: Katheren Shams Other Clinician: Referring Barney Russomanno: Katheren Shams Treating Fortino Haag/Extender: Tito Dine in Treatment: 8 Active Inactive ` Abuse / Safety / Falls / Self Care Management Nursing Diagnoses: Impaired physical mobility Goals: Patient will remain injury free related to falls Date Initiated: 02/01/2018 Target Resolution Date: 04/28/2018 Goal Status: Active Interventions: Assess fall risk on admission and as needed Notes: ` Orientation to the Wound Care Program Nursing Diagnoses: Knowledge deficit related to the wound healing center program Goals: Patient/caregiver will verbalize understanding of the [facility] Date Initiated: 02/01/2018 Target Resolution Date: 04/29/2018 Goal Status: Active Interventions: Provide education on orientation to the wound center Notes: ` Wound/Skin Impairment Nursing Diagnoses: Impaired tissue  integrity Goals: Ulcer/skin breakdown will heal within 14 weeks Date Initiated: 02/01/2018 Target Resolution Date: 04/29/2018 Goal Status: Active Interventions: [name], [name] ([id number]) Assess patient/caregiver ability to obtain necessary supplies Assess patient/caregiver ability to perform ulcer/skin care regimen upon admission and as needed Assess ulceration(s) every visit Notes: Electronic Signature(s) Signed: 03/30/2018 10:02:59 AM By: Gretta Cool, BSN, RN, CWS, Kim RN, BSN Entered By: Gretta Cool, BSN, RN, CWS, Kim on 03/29/2018 10:36:08 [name] ([id number]) -------------------------------------------------------------------------------- Pain Assessment Details Patient Name: Jesse Floor A. Date of Service: 03/29/2018 10:15 AM Medical Record Number: [id number] Patient Account Number: [id number] Date of Birth/Sex: 1935/01/09 (83 y.o. M) Treating RN: Cornell Barman Primary Care Makena Murdock: Katheren Shams Other Clinician: Referring Jacklyn Branan: Katheren Shams Treating Rickayla Wieland/Extender: Tito Dine in Treatment: 8 Active Problems Location of Pain Severity and Description of Pain Patient Has Paino No Site Locations Pain Management and Medication Current Pain Management: Electronic Signature(s) Signed: 03/29/2018 12:16:48 PM By: Lorine Bears RCP, RRT, CHT Signed: 03/30/2018 10:02:59 AM By: Gretta Cool, BSN, RN, CWS, Kim RN, BSN Entered By: Lorine Bears on 03/29/2018 10:17:35 [name] ([id number]) -------------------------------------------------------------------------------- Patient/Caregiver Education Details Patient Name:  [name], [name] Date of Service: 03/29/2018 10:15 AM Medical Record Number: [id number] Patient Account Number: [id number] Date of Birth/Gender: 09/04/1934 (83 y.o. M) Treating RN: Cornell Barman Primary Care Physician: Katheren Shams Other Clinician: Referring Physician: Katheren Shams Treating Physician/Extender: Tito Dine in Treatment: 8 Education Assessment Education Provided To: Patient Education Topics Provided Wound/Skin Impairment: Handouts: Caring for Your Ulcer Methods: Demonstration, Explain/Verbal Responses: State content correctly Electronic Signature(s) Signed: 03/30/2018 10:02:59 AM By: Gretta Cool, BSN, RN, CWS, Kim RN, BSN Entered By: Gretta Cool, BSN, RN, CWS, Kim on 03/29/2018 10:48:03 [name] ([id number]) -------------------------------------------------------------------------------- Wound Assessment Details Patient Name: Jesse Floor A. Date of Service: 03/29/2018 10:15 AM Medical Record Number: [id number] Patient Account Number: [id number] Date of Birth/Sex: April 08, 1935 (83 y.o. M) Treating RN: Montey Hora Primary Care Shanekia Latella: Katheren Shams Other Clinician: Referring Karmon Andis: Katheren Shams Treating Raylei Losurdo/Extender: Tito Dine in Treatment: 8 Wound Status Wound Number: 2 Primary Diabetic Wound/Ulcer of the Lower Extremity Etiology: Wound Location: Right Lower Leg - Medial Wound Open Wounding Event: Gradually Appeared Status: Date Acquired: 10/22/2017 Comorbid Cataracts, Sleep Apnea, Arrhythmia, Congestive Weeks Of Treatment: 8 History: Heart Failure, Hypertension, Peripheral Arterial Clustered Wound: No Disease, Type II Diabetes, Osteoarthritis Photos Photo Uploaded By: Montey Hora on 03/29/2018 14:03:00 Wound Measurements Length: (cm) 0.2 Width: (cm) 0.1 Depth: (cm) 0.1 Area: (cm) 0.016 Volume: (cm) 0.002 % Reduction in Area: 99.5% % Reduction in Volume: 99.7% Epithelialization: None Tunneling: No Undermining: No Wound Description Classification: Grade 2 Foul O Wound Margin: Flat and Intact Slough Exudate Amount: Medium Exudate Type: Serous Exudate Color: amber dor After Cleansing: No /Fibrino No Wound Bed Granulation Amount: None Present (0%) Exposed  Structure Necrotic Amount: Small (1-33%) Fascia Exposed: No Necrotic Quality: Eschar Fat Layer (Subcutaneous Tissue) Exposed: Yes Tendon Exposed: No Muscle Exposed: No Joint Exposed: No Bone Exposed: No Periwound Skin Texture [name], [name] ([id number]) Texture Color No Abnormalities Noted: No No Abnormalities Noted: No Callus: No Atrophie Blanche: No Crepitus: No Cyanosis: No Excoriation: No Ecchymosis: No Induration: No Erythema: No Rash: No Hemosiderin Staining: No Scarring: No Mottled: No Pallor: No Moisture Rubor: No No Abnormalities Noted: No Dry / Scaly: No Maceration: No Wound Preparation Ulcer Cleansing: Rinsed/Irrigated with Saline Topical Anesthetic Applied: Other: lidocaine 4%, Treatment Notes Wound #2 (Right, Medial Lower Leg) Notes Alginate with Silver, TCA. gauze and tape Electronic Signature(s) Signed: 03/30/2018 5:01:53 PM By: Montey Hora Entered By: Montey Hora on 03/29/2018 10:27:48 [name] ([id number]) -------------------------------------------------------------------------------- Vitals Details Patient Name: Jesse Floor A. Date of Service: 03/29/2018 10:15 AM Medical Record Number: [id number] Patient Account Number: [id number] Date of Birth/Sex: 04/18/1935 (83 y.o. M) Treating RN: Cornell Barman Primary Care Vance Belcourt: Katheren Shams Other Clinician: Referring Naureen Benton: Katheren Shams Treating Kierre Hintz/Extender: Tito Dine in Treatment: 8 Vital Signs Time Taken: 10:15 Temperature (F): 97.7 Height (in): 67 Pulse (bpm): 69 Weight (lbs): 167 Respiratory Rate (breaths/min): 16 Body Mass Index (BMI): 26.2 Blood Pressure (mmHg): 135/89 Reference Range: 80 - 120 mg / dl Electronic Signature(s) Signed: 03/29/2018 12:16:48 PM By: Lorine Bears RCP, RRT, CHT Entered By: Lorine Bears on 03/29/2018 10:20:11

## 2018-04-05 ENCOUNTER — Encounter: Payer: Medicare Other | Admitting: Internal Medicine

## 2018-04-05 DIAGNOSIS — E11622 Type 2 diabetes mellitus with other skin ulcer: Secondary | ICD-10-CM | POA: Diagnosis not present

## 2018-04-07 NOTE — Progress Notes (Signed)
[name], [name] ([id number]) Visit Report for 04/05/2018 Arrival Information Details Patient Name: [name], [name] Date of Service: 04/05/2018 3:00 PM Medical Record Number: [id number] Patient Account Number: [id number] Date of Birth/Sex: 10/22/1934 (83 y.o. M) Treating RN: Cornell Barman Primary Care Samir Ishaq: Katheren Shams Other Clinician: Referring Kasondra Junod: Katheren Shams Treating Alliyah Roesler/Extender: Tito Dine in Treatment: 9 Visit Information History Since Last Visit Added or deleted any medications: No Patient Arrived: Cane Any new allergies or adverse reactions: No Arrival Time: 15:00 Had a fall or experienced change in No Accompanied By: wife activities of daily living that may affect Transfer Assistance: None risk of falls: Patient Identification Verified: Yes Signs or symptoms of abuse/neglect since last visito No Secondary Verification Process Yes Hospitalized since last visit: No Completed: Implantable device outside of the clinic excluding No Patient Has Alerts: Yes cellular tissue based products placed in the center Patient Alerts: 01-27-18 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 Date of Service: 04/05/2018 3:00 PM Medical Record Number: [id number] Patient Account Number: [id number] Date of Birth/Sex: 1934-05-28 (83 y.o. M) Treating RN: Cornell Barman Primary Care Josclyn Rosales: Katheren Shams Other Clinician: Referring Braydan Marriott: Katheren Shams Treating Manas Hickling/Extender: Tito Dine in  Treatment: 9 Clinic Level of Care Assessment Items TOOL 4 Quantity Score []  - Use when only an EandM is performed on FOLLOW-UP visit 0 ASSESSMENTS - Nursing Assessment / Reassessment []  - Reassessment of Co-morbidities (includes updates in patient status) 0 []  - 0 Reassessment of Adherence to Treatment Plan ASSESSMENTS - Wound and Skin Assessment / Reassessment X - Simple Wound Assessment / Reassessment - one wound 1 5 []  - 0 Complex Wound Assessment / Reassessment - multiple wounds []  - 0 Dermatologic / Skin Assessment (not related to wound area) ASSESSMENTS - Focused Assessment []  - Circumferential Edema Measurements - multi extremities 0 []  - 0 Nutritional Assessment / Counseling / Intervention []  - 0 Lower Extremity Assessment (monofilament, tuning fork, pulses) []  - 0 Peripheral Arterial Disease Assessment (using hand held doppler) ASSESSMENTS - Ostomy and/or Continence Assessment and Care []  - Incontinence Assessment and Management 0 []  - 0 Ostomy Care Assessment and Management (repouching, etc.) PROCESS - Coordination of Care X - Simple Patient / Family Education for ongoing care 1 15 []  - 0 Complex (extensive) Patient / Family Education for ongoing care []  - 0 Staff obtains Programmer, systems, Records, Test Results / Process Orders []  - 0 Staff telephones HHA, Nursing Homes / Clarify orders / etc []  - 0 Routine Transfer to another Facility (non-emergent condition) []  - 0 Routine Hospital Admission (non-emergent condition) []  - 0 New Admissions / Biomedical engineer / Ordering NPWT, Apligraf, etc. []  - 0 Emergency Hospital Admission (emergent condition) X- 1 10 Simple Discharge Coordination [name], [name] ([id number]) []  - 0 Complex (extensive) Discharge Coordination PROCESS - Special Needs []  - Pediatric / Minor Patient Management 0 []  - 0 Isolation Patient Management []  - 0 Hearing / Language / Visual special needs []  - 0 Assessment of Community assistance  (transportation, D/C planning, etc.) []  - 0 Additional assistance / Altered mentation []  - 0 Support Surface(s) Assessment (bed, cushion, seat, etc.) INTERVENTIONS - Wound Cleansing / Measurement X - Simple Wound Cleansing -  one wound 1 5 []  - 0 Complex Wound Cleansing - multiple wounds X- 1 5 Wound Imaging (photographs - any number of wounds) []  - 0 Wound Tracing (instead of photographs) X- 1 5 Simple Wound Measurement - one wound []  - 0 Complex Wound Measurement - multiple wounds INTERVENTIONS - Wound Dressings X - Small Wound Dressing one or multiple wounds 1 10 []  - 0 Medium Wound Dressing one or multiple wounds []  - 0 Large Wound Dressing one or multiple wounds []  - 0 Application of Medications - topical []  - 0 Application of Medications - injection INTERVENTIONS - Miscellaneous []  - External ear exam 0 []  - 0 Specimen Collection (cultures, biopsies, blood, body fluids, etc.) []  - 0 Specimen(s) / Culture(s) sent or taken to Lab for analysis []  - 0 Patient Transfer (multiple staff / Civil Service fast streamer / Similar devices) []  - 0 Simple Staple / Suture removal (25 or less) []  - 0 Complex Staple / Suture removal (26 or more) []  - 0 Hypo / Hyperglycemic Management (close monitor of Blood Glucose) []  - 0 Ankle / Brachial Index (ABI) - do not check if billed separately X- 1 5 Vital Signs [name], [name] ([id number]) Has the patient been seen at the hospital within the last three years: Yes Total Score: 60 Level Of Care: New/Established - Level 2 Electronic Signature(s) Signed: 04/05/2018 5:34:01 PM By: Gretta Cool, BSN, RN, CWS, Kim RN, BSN Entered By: Gretta Cool, BSN, RN, CWS, Kim on 04/05/2018 15:37:32 [name] ([id number]) -------------------------------------------------------------------------------- Lower Extremity Assessment Details Patient Name: Jesse Floor A. Date of Service: 04/05/2018 3:00 PM Medical Record Number: [id number] Patient Account Number:  [id number] Date of Birth/Sex: March 06, 1935 (83 y.o. M) Treating RN: Secundino Ginger Primary Care Benjamin Merrihew: Katheren Shams Other Clinician: Referring Salina Stanfield: Katheren Shams Treating Elodie Panameno/Extender: Tito Dine in Treatment: 9 Edema Assessment Assessed: [Left: No] [Right: No] [Left: Edema] [Right: :] Calf Left: Right: Point of Measurement: 32 cm From Medial Instep cm cm Ankle Left: Right: Point of Measurement: 10 cm From Medial Instep cm cm Vascular Assessment Pulses: Dorsalis Pedis Palpable: [Left:Yes] Posterior Tibial Extremity colors, hair growth, and conditions: Extremity Color: [Left:Normal] Hair Growth on Extremity: [Left:No] Temperature of Extremity: [Left:Warm] Capillary Refill: [Left:< 3 seconds] Toe Nail Assessment Left: Right: Thick: Yes Discolored: Yes Deformed: Yes Improper Length and Hygiene: No Electronic Signature(s) Signed: 04/05/2018 4:11:31 PM By: Secundino Ginger Entered By: Secundino Ginger on 04/05/2018 15:13:46 [name], [name] ([id number]) -------------------------------------------------------------------------------- Multi Wound Chart Details Patient Name: Jesse Floor A. Date of Service: 04/05/2018 3:00 PM Medical Record Number: [id number] Patient Account Number: [id number] Date of Birth/Sex: 1934-11-29 (83 y.o. M) Treating RN: Cornell Barman Primary Care Anddy Wingert: Katheren Shams Other Clinician: Referring Lynsi Dooner: Katheren Shams Treating Poonam Woehrle/Extender: Tito Dine in Treatment: 9 Vital Signs Height(in): 67 Pulse(bpm): 80 Weight(lbs): 167 Blood Pressure(mmHg): 128/68 Body Mass Index(BMI): 26 Temperature(F): 97.6 Respiratory Rate 16 (breaths/min): Photos: [N/A:N/A] Wound Location: Right, Medial Lower Leg N/A N/A Wounding Event: Gradually Appeared N/A N/A Primary Etiology: Diabetic Wound/Ulcer of the N/A N/A Lower Extremity Comorbid History: Cataracts, Sleep Apnea, N/A N/A Arrhythmia,  Congestive Heart Failure, Hypertension, Peripheral Arterial Disease, Type II Diabetes, Osteoarthritis Date Acquired: 10/22/2017 N/A N/A Weeks of Treatment: 9 N/A N/A Wound Status: Healed - Epithelialized N/A N/A Measurements L x W x D 0x0x0 N/A N/A (cm) Area (cm) : 0 N/A N/A Volume (cm) : 0 N/A N/A % Reduction in Area: 100.00% N/A N/A % Reduction in Volume: 100.00% N/A N/A Classification: Grade 2 N/A N/A Exudate  Amount: Medium N/A N/A Exudate Type: Serous N/A N/A Exudate Color: amber N/A N/A Wound Margin: Flat and Intact N/A N/A Granulation Amount: None Present (0%) N/A N/A Necrotic Amount: None Present (0%) N/A N/A Exposed Structures: Fat Layer (Subcutaneous N/A N/A Tissue) Exposed: Yes Fascia: No [name], [name] ([id number]) Tendon: No Muscle: No Joint: No Bone: No Epithelialization: None N/A N/A Periwound Skin Texture: Excoriation: No N/A N/A Induration: No Callus: No Crepitus: No Rash: No Scarring: No Periwound Skin Moisture: Maceration: No N/A N/A Dry/Scaly: No Periwound Skin Color: Atrophie Blanche: No N/A N/A Cyanosis: No Ecchymosis: No Erythema: No Hemosiderin Staining: No Mottled: No Pallor: No Rubor: No Tenderness on Palpation: No N/A N/A Wound Preparation: Ulcer Cleansing: N/A N/A Rinsed/Irrigated with Saline Topical Anesthetic Applied: Other: lidocaine 4% Treatment Notes Electronic Signature(s) Signed: 04/05/2018 5:24:02 PM By: Linton Ham MD Entered By: Linton Ham on 04/05/2018 16:41:25 [name] ([id number]) -------------------------------------------------------------------------------- Multi-Disciplinary Care Plan Details Patient Name: Jesse Floor A. Date of Service: 04/05/2018 3:00 PM Medical Record Number: [id number] Patient Account Number: [id number] Date of Birth/Sex: 1934-12-21 (83 y.o. M) Treating RN: Cornell Barman Primary Care Talyn Eddie: Katheren Shams Other Clinician: Referring Ankur Snowdon:  Katheren Shams Treating Sharnetta Gielow/Extender: Tito Dine in Treatment: 9 Active Inactive Electronic Signature(s) Signed: 04/05/2018 5:34:01 PM By: Gretta Cool, BSN, RN, CWS, Kim RN, BSN Entered By: Gretta Cool, BSN, RN, CWS, Kim on 04/05/2018 15:32:54 [name] ([id number]) -------------------------------------------------------------------------------- Pain Assessment Details Patient Name: Jesse Floor A. Date of Service: 04/05/2018 3:00 PM Medical Record Number: [id number] Patient Account Number: [id number] Date of Birth/Sex: 1934/11/14 (83 y.o. M) Treating RN: Cornell Barman Primary Care Sigurd Pugh: Katheren Shams Other Clinician: Referring Lynnelle Mesmer: Katheren Shams Treating Mel Langan/Extender: Tito Dine in Treatment: 9 Active Problems Location of Pain Severity and Description of Pain Patient Has Paino No Site Locations Pain Management and Medication Current Pain Management: Electronic Signature(s) Signed: 04/05/2018 4:23:06 PM By: Lorine Bears RCP, RRT, CHT Signed: 04/05/2018 5:34:01 PM By: Gretta Cool, BSN, RN, CWS, Kim RN, BSN Entered By: Lorine Bears on 04/05/2018 15:01:43 [name] ([id number]) -------------------------------------------------------------------------------- Wound Assessment Details Patient Name: Jesse Floor A. Date of Service: 04/05/2018 3:00 PM Medical Record Number: [id number] Patient Account Number: [id number] Date of Birth/Sex: Oct 21, 1934 (83 y.o. M) Treating RN: Cornell Barman Primary Care Merrel Crabbe: Katheren Shams Other Clinician: Referring Hilliard Borges: Katheren Shams Treating Annmargaret Decaprio/Extender: Tito Dine in Treatment: 9 Wound Status Wound Number: 2 Primary Diabetic Wound/Ulcer of the Lower Extremity Etiology: Wound Location: Right, Medial Lower Leg Wound Healed - Epithelialized Wounding Event: Gradually Appeared Status: Date Acquired:  10/22/2017 Comorbid Cataracts, Sleep Apnea, Arrhythmia, Congestive Weeks Of Treatment: 9 History: Heart Failure, Hypertension, Peripheral Arterial Clustered Wound: No Disease, Type II Diabetes, Osteoarthritis Photos Photo Uploaded By: Secundino Ginger on 04/05/2018 16:05:09 Wound Measurements Length: (cm) 0 % Reduc Width: (cm) 0 % Reduc Depth: (cm) 0 Epithel Area: (cm) 0 Volume: (cm) 0 tion in Area: 100% tion in Volume: 100% ialization: None Wound Description Classification: Grade 2 Foul Od Wound Margin: Flat and Intact Slough/ Exudate Amount: Medium Exudate Type: Serous Exudate Color: amber or After Cleansing: No Fibrino No Wound Bed Granulation Amount: None Present (0%) Exposed Structure Necrotic Amount: None Present (0%) Fascia Exposed: No Fat Layer (Subcutaneous Tissue) Exposed: Yes Tendon Exposed: No Muscle Exposed: No Joint Exposed: No Bone Exposed: No Periwound Skin Texture [name], [name] ([id number]) Texture Color No Abnormalities Noted: No No Abnormalities Noted: No Callus: No Atrophie Blanche: No Crepitus: No Cyanosis: No Excoriation: No Ecchymosis: No  Induration: No Erythema: No Rash: No Hemosiderin Staining: No Scarring: No Mottled: No Pallor: No Moisture Rubor: No No Abnormalities Noted: No Dry / Scaly: No Maceration: No Wound Preparation Ulcer Cleansing: Rinsed/Irrigated with Saline Topical Anesthetic Applied: Other: lidocaine 4%, Electronic Signature(s) Signed: 04/05/2018 5:34:01 PM By: Gretta Cool, BSN, RN, CWS, Kim RN, BSN Entered By: Gretta Cool, BSN, RN, CWS, Kim on 04/05/2018 15:32:39 [name] ([id number]) -------------------------------------------------------------------------------- Vitals Details Patient Name: Jesse Floor A. Date of Service: 04/05/2018 3:00 PM Medical Record Number: [id number] Patient Account Number: [id number] Date of Birth/Sex: 23-Sep-1934 (83 y.o. M) Treating RN: Cornell Barman Primary Care Nicole Hafley:  Katheren Shams Other Clinician: Referring Arsen Mangione: Katheren Shams Treating Zackrey Dyar/Extender: Tito Dine in Treatment: 9 Vital Signs Time Taken: 15:00 Temperature (F): 97.6 Height (in): 67 Pulse (bpm): 80 Weight (lbs): 167 Respiratory Rate (breaths/min): 16 Body Mass Index (BMI): 26.2 Blood Pressure (mmHg): 128/68 Reference Range: 80 - 120 mg / dl Electronic Signature(s) Signed: 04/05/2018 4:23:06 PM By: Lorine Bears RCP, RRT, CHT Entered By: Lorine Bears on 04/05/2018 15:04:53

## 2018-04-07 NOTE — Progress Notes (Signed)
LINN, GOETZE ([id number]) Visit Report for 04/05/2018 HPI Details Patient Name: [name], [name] Date of Service: 04/05/2018 3:00 PM Medical Record Number: [id number] Patient Account Number: [id number] Date of Birth/Sex: 1934-09-18 (83 y.o. M) Treating RN: Cornell Barman Primary Care Provider: Katheren Shams Other Clinician: Referring Provider: Katheren Shams Treating Provider/Extender: Tito Dine in Treatment: 9 History of Present Illness Location: Left foot surgical site in the distal/lateral region Quality: Patient describes the pain a sore/burning sensation Severity: 3/10 at rest and 8/10 with manipulation Duration: Patientos surgery was on 10/15/16 with Dr. Albertine Patricia DPM Context: Patient had an amputation of the left 3rd - 5th toes on 10/15/16 and the surgical site has not healed following Modifying Factors: Currently this has been packed with saline soaked gauze in a wet to dry format Associated Signs and Symptoms: HTN, DM, A-fib, chronic Coumadin use HPI Description: 11/29/16 patient presents today for evaluation concerning a nonhealing surgical wound following amputation of his left third through fifth toes which was performed on 10/15/16. Unfortunately following this amputation the circle side has not closed appropriately and he continues to have a significant amount of discomfort. He did have an x-ray performed on 10/21/16 which showed him being status post D3 toe amputation but otherwise no acute findings. Patient also has venous imaging of the left lower extremity which revealed no evidence of DVT on 10/22/16. His last hemoglobin A 1C was 8.5 and this was on 10/22/16 and prior to his amputation patient did have vascular surgery with Dr. Leotis Pain where he had a catheter placed in the left anterior tibialis/dorsalis pedis artery from the right femoral approach. Percutaneous transluminal angioplasty of the left anterior tibial artery/dorsalis pedis artery  was performed. This procedure apparently went very well. Unfortunately his healing just has not been what both Dr. Elvina Mattes nor himself were expecting or wanting. The question posed to [place] today is whether hyperbaric therapy would be of benefit for him. READMISSION 02/01/18 This is now a 83 year old man we had in the clinic over 2 years ago. At that point he was here for evaluation of amputations of several of his toes on his left foot with a nonhealing surgical wound. He was only here for 1 visit. The patient tells me he went on to have a left below-knee amputation slightly over a year ago. Sometime earlier this year perhaps in April he traumatized his right shin and he has had an open wound here since then. He is already seen Dr. Lucky Cowboy and vein and vascular. He had noninvasive tests that showed an ABI on the right of 1.23 but a TBI in the right of only 0.27. He had monophasic waveforms at the anterior tibial and biphasic at the posterior tibial. The overall interpretation was that although his resting right ankle brachial index is within normal limits this may be falsely elevated. Furthermore his toe brachial index was quite a bit reduced. According to the last note from vein and vascular on 01/27/18 they discussed angiogram with possible revascularization on the right. He was felt to be reasonably high risk because of his and inability inability to stay still requiring general anesthesia therefore. They told him to think about it and get back to them. He was referred here for review of this wound. As I understand things they've been using Xeroform I'm not clear what they've been putting on this although they did talk about "an ointment". I wonder whether this was Santyl The patient has had a history of coronary artery  disease with a non-STEMI MI in August, diastolic heart failure, atrial fib, type 2 diabetes with known PAD, left BKA one year ago, hypertension and seizure disorder. During his admission  to hospital in July MRSA was apparently cultured from this wound and he received a course of Doxy. As he has had recent noninvasive arterial studies done on 01/27/18,no ABIs were felt to be necessary here. 02/08/18; patient arrives in with a better looking wound. Dimensions are smaller. using silver collagen 02/15/18; wound is measuring smaller. Still with adherent surface material requiring debridement. We've been using silver collagen 02/22/18; wound is measuring smaller. No debridement is required. We're using silver collagen under compression. 03/01/18; continued improvement in wound dimensions. No debridement is required. We've been using silver collagen under Boschert, Ashlin A. ([id number]) compression 03/08/18; wound stalled in terms of dimensions this week. Surface of the wound still looks healthy we've been using silver collagen under compression he has home health changing this. Change to Hydrofera Blue today 03/15/18; inadvertently put silver collagen on the wound today. In general his wound continues to contract. Using border foam cover 03/22/18; changed a dressing to calcium alginate. Wound on the right leg is just about closed. He continues to have irritated hyperkeratotic skin around the wound. He claims he did not have a skin lesion in this area that the wound was caused by trauma although the history is vague 03/29/18; using calcium alginate. Came in with debris over wound surface although her intake nurse noted drainage. The skin around this looks a lot better with the triamcinolone I prescribed. 04/05/18; used Calcium alginate. The wound is closed. This was initially traumatic it looks as though he has some degree of chronic venous insufficiency may be with stasis dermatitis. For now I have recommended simply support stockings Electronic Signature(s) Signed: 04/05/2018 5:24:02 PM By: Linton Ham MD Entered By: Linton Ham on 04/05/2018 16:43:01 [name]  ([id number]) -------------------------------------------------------------------------------- Physical Exam Details Patient Name: Jesse Floor A. Date of Service: 04/05/2018 3:00 PM Medical Record Number: [id number] Patient Account Number: [id number] Date of Birth/Sex: 09-25-1934 (83 y.o. M) Treating RN: Cornell Barman Primary Care Provider: Katheren Shams Other Clinician: Referring Provider: Katheren Shams Treating Provider/Extender: Tito Dine in Treatment: 9 Constitutional Sitting or standing Blood Pressure is within target range for patient.. Pulse regular and within target range for patient.Marland Kitchen Respirations regular, non-labored and within target range.. Temperature is normal and within the target range for the patient.Marland Kitchen appears in no distress. Notes Wound exam; area on the right anterior tibias closed. Some surface debris removed this appears to be well epithelialized. The dermatitis like changes in his scan are resolved Electronic Signature(s) Signed: 04/05/2018 5:24:02 PM By: Linton Ham MD Entered By: Linton Ham on 04/05/2018 16:44:07 [name] ([id number]) -------------------------------------------------------------------------------- Physician Orders Details Patient Name: Jesse Floor A. Date of Service: 04/05/2018 3:00 PM Medical Record Number: [id number] Patient Account Number: [id number] Date of Birth/Sex: 01/19/1935 (83 y.o. M) Treating RN: Cornell Barman Primary Care Provider: Katheren Shams Other Clinician: Referring Provider: Katheren Shams Treating Provider/Extender: Tito Dine in Treatment: 9 Verbal / Phone Orders: No Diagnosis Coding Discharge From [facility] o Discharge from [place] Signature(s) Signed: 04/05/2018 5:24:02 PM By: Linton Ham MD Signed: 04/05/2018 5:34:01 PM By: Gretta Cool, BSN, RN, CWS, Kim RN, BSN Entered By: Gretta Cool, BSN, RN, CWS, Kim on 04/05/2018  15:33:50 [name] ([id number]) -------------------------------------------------------------------------------- Problem List Details Patient Name: Jesse Floor A. Date of Service: 04/05/2018 3:00 PM Medical Record Number:  [id number] Patient Account Number: [id number] Date of Birth/Sex: [birth year] (83 y.o. M) Treating RN: Cornell Barman Primary Care Provider: Katheren Shams Other Clinician: Referring Provider: Katheren Shams Treating Provider/Extender: Tito Dine in Treatment: 9 Active Problems ICD-10 Evaluated Encounter Code Description Active Date Today Diagnosis L97.212 Non-pressure chronic ulcer of right calf with fat layer exposed 02/01/2018 No Yes E11.622 Type 2 diabetes mellitus with other skin ulcer 02/01/2018 No Yes E11.51 Type 2 diabetes mellitus with diabetic peripheral angiopathy 02/01/2018 No Yes without gangrene Inactive Problems Resolved Problems Electronic Signature(s) Signed: 04/05/2018 5:24:02 PM By: Linton Ham MD Entered By: Linton Ham on 04/05/2018 16:41:18 [name], [name] Kitchen ([id number]) -------------------------------------------------------------------------------- Progress Note Details Patient Name: Jesse Floor A. Date of Service: 04/05/2018 3:00 PM Medical Record Number: [id number] Patient Account Number: [id number] Date of Birth/Sex: 10/01/1934 (83 y.o. M) Treating RN: Cornell Barman Primary Care Provider: Katheren Shams Other Clinician: Referring Provider: Katheren Shams Treating Provider/Extender: Tito Dine in Treatment: 9 Subjective History of Present Illness (HPI) The following HPI elements were documented for the patient's wound: Location: Left foot surgical site in the distal/lateral region Quality: Patient describes the pain a sore/burning sensation Severity: 3/10 at rest and 8/10 with manipulation Duration: Patient s surgery was on 10/15/16 with Dr. Albertine Patricia  DPM Context: Patient had an amputation of the left 3rd - 5th toes on 10/15/16 and the surgical site has not healed following Modifying Factors: Currently this has been packed with saline soaked gauze in a wet to dry format Associated Signs and Symptoms: HTN, DM, A-fib, chronic Coumadin use 11/29/16 patient presents today for evaluation concerning a nonhealing surgical wound following amputation of his left third through fifth toes which was performed on 10/15/16. Unfortunately following this amputation the circle side has not closed appropriately and he continues to have a significant amount of discomfort. He did have an x-ray performed on 10/21/16 which showed him being status post D3 toe amputation but otherwise no acute findings. Patient also has venous imaging of the left lower extremity which revealed no evidence of DVT on 10/22/16. His last hemoglobin A 1C was 8.5 and this was on 10/22/16 and prior to his amputation patient did have vascular surgery with Dr. Leotis Pain where he had a catheter placed in the left anterior tibialis/dorsalis pedis artery from the right femoral approach. Percutaneous transluminal angioplasty of the left anterior tibial artery/dorsalis pedis artery was performed. This procedure apparently went very well. Unfortunately his healing just has not been what both Dr. Elvina Mattes nor himself were expecting or wanting. The question posed to [place] today is whether hyperbaric therapy would be of benefit for him. READMISSION 02/01/18 This is now a 83 year old man we had in the clinic over 2 years ago. At that point he was here for evaluation of amputations of several of his toes on his left foot with a nonhealing surgical wound. He was only here for 1 visit. The patient tells me he went on to have a left below-knee amputation slightly over a year ago. Sometime earlier this year perhaps in April he traumatized his right shin and he has had an open wound here since then. He is already seen Dr.  Lucky Cowboy and vein and vascular. He had noninvasive tests that showed an ABI on the right of 1.23 but a TBI in the right of only 0.27. He had monophasic waveforms at the anterior tibial and biphasic at the posterior tibial. The overall interpretation was that although his resting right ankle brachial index is within  normal limits this may be falsely elevated. Furthermore his toe brachial index was quite a bit reduced. According to the last note from vein and vascular on 01/27/18 they discussed angiogram with possible revascularization on the right. He was felt to be reasonably high risk because of his and inability inability to stay still requiring general anesthesia therefore. They told him to think about it and get back to them. He was referred here for review of this wound. As I understand things they've been using Xeroform I'm not clear what they've been putting on this although they did talk about "an ointment". I wonder whether this was [name] The patient has had a history of coronary artery disease with a non-STEMI MI in August, diastolic heart failure, atrial fib, type 2 diabetes with known PAD, left BKA one year ago, hypertension and seizure disorder. During his admission to hospital in July MRSA was apparently cultured from this wound and he received a course of Doxy. As he has had recent noninvasive arterial studies done on 01/27/18,no ABIs were felt to be necessary here. 02/08/18; patient arrives in with a better looking wound. Dimensions are smaller. using silver collagen 02/15/18; wound is measuring smaller. Still with adherent surface material requiring debridement. We've been using silver collagen 02/22/18; wound is measuring smaller. No debridement is required. We're using silver collagen under compression. 03/01/18; continued improvement in wound dimensions. No debridement is required. We've been using silver collagen under [name], [name] ([id number]) compression 03/08/18; wound stalled in  terms of dimensions this week. Surface of the wound still looks healthy we've been using silver collagen under compression he has home health changing this. Change to Hydrofera Blue today 03/15/18; inadvertently put silver collagen on the wound today. In general his wound continues to contract. Using border foam cover 03/22/18; changed a dressing to calcium alginate. Wound on the right leg is just about closed. He continues to have irritated hyperkeratotic skin around the wound. He claims he did not have a skin lesion in this area that the wound was caused by trauma although the history is vague 03/29/18; using calcium alginate. Came in with debris over wound surface although her intake nurse noted drainage. The skin around this looks a lot better with the triamcinolone I prescribed. 04/05/18; used Calcium alginate. The wound is closed. This was initially traumatic it looks as though he has some degree of chronic venous insufficiency may be with stasis dermatitis. For now I have recommended simply support stockings Objective Constitutional Sitting or standing Blood Pressure is within target range for patient.. Pulse regular and within target range for patient.Marland Kitchen Respirations regular, non-labored and within target range.. Temperature is normal and within the target range for the patient.Marland Kitchen appears in no distress. Vitals Time Taken: 3:00 PM, Height: 67 in, Weight: 167 lbs, BMI: 26.2, Temperature: 97.6 F, Pulse: 80 bpm, Respiratory Rate: 16 breaths/min, Blood Pressure: 128/68 mmHg. General Notes: Wound exam; area on the right anterior tibias closed. Some surface debris removed this appears to be well epithelialized. The dermatitis like changes in his scan are resolved Integumentary (Hair, Skin) Wound #2 status is Healed - Epithelialized. Original cause of wound was Gradually Appeared. The wound is located on the Right,Medial Lower Leg. The wound measures 0cm length x 0cm width x 0cm depth; 0cm^2  area and 0cm^3 volume. There is Fat Layer (Subcutaneous Tissue) Exposed exposed. There is a medium amount of serous drainage noted. The wound margin is flat and intact. There is no granulation within the wound bed. There  is no necrotic tissue within the wound bed. The periwound skin appearance did not exhibit: Callus, Crepitus, Excoriation, Induration, Rash, Scarring, Dry/Scaly, Maceration, Atrophie Blanche, Cyanosis, Ecchymosis, Hemosiderin Staining, Mottled, Pallor, Rubor, Erythema. Assessment Active Problems ICD-10 Non-pressure chronic ulcer of right calf with fat layer exposed Type 2 diabetes mellitus with other skin ulcer Type 2 diabetes mellitus with diabetic peripheral angiopathy without gangrene [name], [name] ([id number]) Plan Discharge From [facility]: Discharge from [place] #1 the patient to be discharged from the wound care center. #2 I recommended support stockings for now Electronic Signature(s) Signed: 04/05/2018 5:24:02 PM By: Linton Ham MD Entered By: Linton Ham on 04/05/2018 16:44:54 [name] ([id number]) -------------------------------------------------------------------------------- SuperBill Details Patient Name: Jesse Floor A. Date of Service: 04/05/2018 Medical Record Number: [id number] Patient Account Number: [id number] Date of Birth/Sex: 1934-11-12 (83 y.o. M) Treating RN: Cornell Barman Primary Care Provider: Katheren Shams Other Clinician: Referring Provider: Katheren Shams Treating Provider/Extender: Tito Dine in Treatment: 9 Diagnosis Coding ICD-10 Codes Code Description [phone] Non-pressure chronic ulcer of right calf with fat layer exposed E11.622 Type 2 diabetes mellitus with other skin ulcer E11.51 Type 2 diabetes mellitus with diabetic peripheral angiopathy without gangrene Facility Procedures CPT4 Code: [id number] Description: [phone] - WOUND CARE VISIT-LEV 2 EST  PT Modifier: Quantity: 1 Physician Procedures CPT4 Code: [id number] Description: 40352 - WC PHYS LEVEL 2 - EST PT ICD-10 Diagnosis Description L97.212 Non-pressure chronic ulcer of right calf with fat layer exp E11.622 Type 2 diabetes mellitus with other skin ulcer Modifier: osed Quantity: 1 Electronic Signature(s) Signed: 04/05/2018 5:24:02 PM By: Linton Ham MD Entered By: Linton Ham on 04/05/2018 16:45:24

## 2018-04-28 ENCOUNTER — Ambulatory Visit (INDEPENDENT_AMBULATORY_CARE_PROVIDER_SITE_OTHER): Payer: Medicare Other

## 2018-04-28 ENCOUNTER — Ambulatory Visit (INDEPENDENT_AMBULATORY_CARE_PROVIDER_SITE_OTHER): Payer: Medicare Other | Admitting: Vascular Surgery

## 2018-04-28 ENCOUNTER — Encounter (INDEPENDENT_AMBULATORY_CARE_PROVIDER_SITE_OTHER): Payer: Self-pay | Admitting: Vascular Surgery

## 2018-04-28 VITALS — BP 141/84 | HR 61 | Resp 16 | Ht 67.0 in | Wt 176.0 lb

## 2018-04-28 DIAGNOSIS — E119 Type 2 diabetes mellitus without complications: Secondary | ICD-10-CM | POA: Diagnosis not present

## 2018-04-28 DIAGNOSIS — I4891 Unspecified atrial fibrillation: Secondary | ICD-10-CM

## 2018-04-28 DIAGNOSIS — I10 Essential (primary) hypertension: Secondary | ICD-10-CM

## 2018-04-28 DIAGNOSIS — I7389 Other specified peripheral vascular diseases: Secondary | ICD-10-CM

## 2018-04-28 DIAGNOSIS — I739 Peripheral vascular disease, unspecified: Secondary | ICD-10-CM

## 2018-04-28 DIAGNOSIS — Z89512 Acquired absence of left leg below knee: Secondary | ICD-10-CM

## 2018-04-28 DIAGNOSIS — Z794 Long term (current) use of insulin: Secondary | ICD-10-CM

## 2018-04-28 DIAGNOSIS — I7025 Atherosclerosis of native arteries of other extremities with ulceration: Secondary | ICD-10-CM

## 2018-04-28 NOTE — Assessment & Plan Note (Signed)
His noninvasive studies today showed noncompressible vessels on the right but his digital pressures actually significantly improved at 67 from what it was previously which was in the 40s.  His waveforms are fairly good as well. Doing well.  Wound is healed.   Recheck in 6 months.

## 2018-04-28 NOTE — Progress Notes (Signed)
MRN : [id number]  [name] is a 83 y.o. (1935-02-11) male who presents with chief complaint of  Chief Complaint  Patient presents with  . Follow-up    74month ABI  .  History of Present Illness: Patient returns today in follow up of PAD.  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  He had an ulceration on the right leg but after diligent wound care, this healed and has done well.  He was being seen by the wound care clinic who did a great job with him.  His noninvasive studies today showed noncompressible vessels on the right but his digital pressures actually significantly improved at 67 from what it was previously which was in the 40s.  His waveforms are fairly good as well.  Current Outpatient Medications  Medication Sig Dispense Refill  . aspirin EC 81 MG tablet Take 81 mg by mouth daily.    Marland Kitchen atorvastatin (LIPITOR) 80 MG tablet Take 80 mg by mouth daily at 6 PM.     . collagenase (SANTYL) ointment Apply topically daily. Right leg wound 15 g 0  . digoxin (LANOXIN) 0.125 MG tablet Take 1 tablet (0.125 mg total) by mouth daily. 30 tablet 2  . diltiazem (CARDIZEM CD) 120 MG 24 hr capsule Take 1 capsule (120 mg total) by mouth daily. 30 capsule 0  . empagliflozin (JARDIANCE) 25 MG TABS tablet Take 25 mg by mouth daily.    . furosemide (LASIX) 20 MG tablet Take 1 tablet (20 mg total) by mouth daily. 30 tablet 2  . HYDROcodone-acetaminophen (NORCO/VICODIN) 5-325 MG tablet 1-2 tabs po qd prn 6 tablet 0  . insulin glargine (LANTUS) 100 UNIT/ML injection Inject 0.1 mLs (10 Units total) into the skin at bedtime. (Patient taking differently: Inject 15 Units into the skin at bedtime. ) 10 mL 11  . insulin lispro (HUMALOG) 100 UNIT/ML injection Inject 0.03 mLs (3 Units total) into the skin 3 (three) times daily with meals. (Patient taking differently: Inject 3 Units into the skin 3 (three) times daily with meals. Plus 1 additional unit for every 50 BG points over  200 (max 5 additional units per dose)) 10 mL 0  . isosorbide mononitrate (IMDUR) 30 MG 24 hr tablet Take 1 tablet (30 mg total) by mouth daily. 30 tablet 2  . lisinopril (PRINIVIL,ZESTRIL) 2.5 MG tablet Take 1 tablet (2.5 mg total) by mouth daily. 30 tablet 0  . metoprolol tartrate (LOPRESSOR) 25 MG tablet Take 1 tablet (25 mg total) by mouth 3 (three) times daily. 90 tablet 0  . polyethylene glycol (MIRALAX / GLYCOLAX) packet Take 17 g by mouth daily. 30 each 0  . potassium chloride SA (K-DUR,KLOR-CON) 20 MEQ tablet Take 20 mEq by mouth daily.    . predniSONE (DELTASONE) 20 MG tablet 2 tabs po qd x 3 days, then 1 tab po qd x 3 days, then half a tab po qd x 2 days 10 tablet 0  . risperiDONE (RISPERDAL) 0.5 MG tablet Take 1 tablet (0.5 mg total) by mouth at bedtime. 30 tablet 0  . rivaroxaban (XARELTO) 20 MG TABS tablet Take 1 tablet (20 mg total) by mouth daily with supper. 30 tablet 0   No current facility-administered medications for this visit.     Past Medical History:  Diagnosis Date  . Arthritis   . Atrial fibrillation ([place])   . Carcinoma of prostate ([place])   . CHF (congestive heart failure) ([place])   .  Chronic kidney disease   . Diabetes mellitus without complication ([place])   . ED (erectile dysfunction)   . Frequent urination   . Hyperlipidemia   . Hypertension   . Moderate mitral insufficiency   . Peripheral vascular disease ([place])   . Pneumonia 04/2016  . Prostate cancer ([place])   . Sleep apnea    OSA--USE C-PAP  . Ulcer of left foot due to type 2 diabetes mellitus ([place])   . Urinary stress incontinence, male     Past Surgical History:  Procedure Laterality Date  . AMPUTATION Left 01/12/2017   Procedure: AMPUTATION BELOW KNEE;  Surgeon: Algernon Huxley, MD;  Location: [facility];  Service: General;  Laterality: Left;  . APLIGRAFT PLACEMENT Left 10/15/2016   Procedure: APLIGRAFT PLACEMENT;  Surgeon: Albertine Patricia, DPM;  Location: [facility];  Service: Podiatry;  Laterality: Left;   necrotic ulcer  . CHOLECYSTECTOMY    . EYE SURGERY Bilateral    Cataract Extraction with IOL  . IRRIGATION AND DEBRIDEMENT FOOT Left 10/15/2016   Procedure: IRRIGATION AND DEBRIDEMENT FOOT-EXCISIONAL DEBRIDEMENT OF SKIN 3RD, 4TH AND 5TH TOES WITH APPLICATION OF APLIGRAFT;  Surgeon: Albertine Patricia, DPM;  Location: [facility];  Service: Podiatry;  Laterality: Left;  necrotic,gangrene  . IRRIGATION AND DEBRIDEMENT FOOT Left 12/09/2016   Procedure: IRRIGATION AND DEBRIDEMENT FOOT-MUSCLE/FASCIA, RESECTION OF FOURTH AND FIFTH METATARSAL NECROTIC BONE;  Surgeon: Albertine Patricia, DPM;  Location: [facility];  Service: Podiatry;  Laterality: Left;  . LOWER EXTREMITY ANGIOGRAPHY Left 08/16/2016   Procedure: Lower Extremity Angiography;  Surgeon: Algernon Huxley, MD;  Location: [place] [facility];  Service: Cardiovascular;  Laterality: Left;  . LOWER EXTREMITY ANGIOGRAPHY Left 09/01/2016   Procedure: Lower Extremity Angiography;  Surgeon: Algernon Huxley, MD;  Location: [place] [facility];  Service: Cardiovascular;  Laterality: Left;  . LOWER EXTREMITY ANGIOGRAPHY Left 10/14/2016   Procedure: Lower Extremity Angiography;  Surgeon: Algernon Huxley, MD;  Location: [place] [facility];  Service: Cardiovascular;  Laterality: Left;  . LOWER EXTREMITY ANGIOGRAPHY Left 12/20/2016   Procedure: Lower Extremity Angiography;  Surgeon: Algernon Huxley, MD;  Location: [place] [facility];  Service: Cardiovascular;  Laterality: Left;  . LOWER EXTREMITY INTERVENTION  09/01/2016   Procedure: Lower Extremity Intervention;  Surgeon: Algernon Huxley, MD;  Location: [place] [facility];  Service: Cardiovascular;;  . PROSTATE SURGERY     removal  . TONSILLECTOMY     as a child   Family History  Problem Relation Age of Onset  . Prostate cancer Neg Hx   . Bladder Cancer Neg Hx   . Kidney disease Neg Hx   No bleeding disorders, clotting disorders, autoimmune diseases, or aneurysms  Social History      Social History  Substance  Use Topics  . Smoking status: Former Smoker    Quit date: 10/14/1994  . Smokeless tobacco: Never Used  . Alcohol use No  Married, wife accompanies today        Allergies  Allergen Reactions  . Contrast Media [Iodinated Diagnostic Agents]     SOB  . Iohexol     Desc: Respiratory Distress, Laryngedema, diaphoresis, Onset Date: 16109604   . Latex Rash       REVIEW OF SYSTEMS(Negative unless checked)  Constitutional: [] Weight loss[] Fever[] Chills Cardiac:[] Chest pain[] Chest pressure[x] Palpitations [] Shortness of breath when laying flat [] Shortness of breath at rest [x] Shortness of breath with exertion. Vascular: [x] Pain in legs with walking[] Pain in legsat rest[] Pain in legs when laying flat [] Claudication [x] Pain in feet  when walking [x] Pain in feet at rest [x] Pain in feet when laying flat [] History of DVT [] Phlebitis [x] Swelling in legs [] Varicose veins [x] Non-healing ulcers Pulmonary: [] Uses home oxygen [x] Productive cough[] Hemoptysis [] Wheeze [] COPD [] Asthma Neurologic: [] Dizziness [] Blackouts [] Seizures [] History of stroke [] History of TIA[] Aphasia [] Temporary blindness[] Dysphagia [] Weaknessor numbness in arms [] Weakness or numbnessin legs Musculoskeletal: [x] Arthritis [] Joint swelling [] Joint pain [] Low back pain Hematologic:[] Easy bruising[] Easy bleeding [] Hypercoagulable state [] Anemic [] Hepatitis Gastrointestinal:[] Blood in stool[] Vomiting blood[] Gastroesophageal reflux/heartburn[] Abdominal pain Genitourinary: [] Chronic kidney disease [] Difficulturination [] Frequenturination [] Burning with urination[] Hematuria Skin: [] Rashes [x] Ulcers [x] Wounds Psychological: [] History of anxiety[] History of major depression.    Physical Examination  BP (!) 141/84 (BP Location: Right Arm)   Pulse 61   Resp 16   Ht 5\' 7"  (1.702 m)   Wt 176 lb  (79.8 kg)   BMI 27.57 kg/m  Gen:  WD/WN, NAD Head: [place]/AT, No temporalis wasting. Ear/Nose/Throat: Hearing grossly intact, nares w/o erythema or drainage Eyes: Conjunctiva clear. Sclera non-icteric Neck: Supple.  Trachea midline Pulmonary:  Good air movement, no use of accessory muscles.  Cardiac: Irregular Vascular:  Vessel Right Left  Radial Palpable Palpable                          PT  1+ palpable  not palpable  DP  1+ palpable  not palpable    Musculoskeletal: M/S 5/5 throughout.  Left BKA site well-healed.  Trace right lower extremity edema. Neurologic: Sensation grossly intact in extremities.  Symmetrical.  Speech is fluent.  Psychiatric: Judgment intact, Mood & affect appropriate for pt's clinical situation. Dermatologic: No rashes or ulcers noted.  No cellulitis or open wounds.  Previous right leg ulceration has healed       Labs No results found for this or any previous visit (from the past 2160 hour(s)).  Radiology No results found.  Assessment/Plan Hyperlipidemia lipid control important in reducing the progression of atherosclerotic disease. Continue statin therapy   Type 2 diabetes mellitus treated with insulin (HCC) blood glucose control important in reducing the progression of atherosclerotic disease. Also, involved in wound healing. On appropriate medications.   Essential hypertension blood pressure control important in reducing the progression of atherosclerotic disease. On appropriate oral medications.  A-fib (HCC) On anticoagulation. Rapid A. fib lead to a prolonged hospitalization lastyear.  PAD (peripheral artery disease) (HCC) His noninvasive studies today showed noncompressible vessels on the right but his digital pressures actually significantly improved at 67 from what it was previously which was in the 40s.  His waveforms are fairly good as well. Doing well.  Wound is healed.   Recheck in 6 months.    Leotis Pain,  MD  04/28/2018 10:46 AM    This note was created with Dragon medical transcription system.  Any errors from dictation are purely unintentional

## 2018-05-30 ENCOUNTER — Telehealth (INDEPENDENT_AMBULATORY_CARE_PROVIDER_SITE_OTHER): Payer: Self-pay

## 2018-05-30 ENCOUNTER — Telehealth (INDEPENDENT_AMBULATORY_CARE_PROVIDER_SITE_OTHER): Payer: Self-pay | Admitting: Vascular Surgery

## 2018-05-30 NOTE — Telephone Encounter (Signed)
Unsure of what paperwork, if the [facility] sent over any paperwork it would be with Dr. Delana Meyer to be signed. I have not received anything from Dr. Delana Meyer.

## 2018-05-30 NOTE — Telephone Encounter (Signed)
-----   Message from Carlena Bjornstad sent at 05/30/2018  1:15 PM EST ----- Regarding: papers faxed Patient said that he needs some papers faxed [facility] to [facility] so that he can be seen there. He said [employer] is no longer in [place] and that the [facility] told him once the papers are faxed from here they will give him an appointment. Call patient for clarification and he didn't know what papers. Can be reached at numbers in the system.

## 2018-05-30 NOTE — Telephone Encounter (Signed)
Unsure of how to handle the message below, please advise

## 2018-05-30 NOTE — Telephone Encounter (Signed)
Patient stated he needed a fax from this office to let the [facility] know that he needs an appointment. He was told by [facility] from [facility] that was all he needed for his appointment. He is calling [facility] again to verify but would like this done in the mean time.

## 2018-06-01 NOTE — Telephone Encounter (Signed)
Spoke with the patient on 05/30/2018 to get a better understanding of his need. He wants to transfer his care from [facility] to the [facility] and need information sent to them. I asked the patient to have the [place] [facility] send over a request for his records and we will get this taken care of. Patient understood and will contact the [facility].

## 2018-06-02 ENCOUNTER — Inpatient Hospital Stay
Admission: EM | Admit: 2018-06-02 | Discharge: 2018-06-09 | DRG: 280 | Disposition: A | Discharge status: Home / Self Care | Payer: Medicare Other | Attending: Internal Medicine | Admitting: Internal Medicine

## 2018-06-02 ENCOUNTER — Emergency Department: Payer: Medicare Other

## 2018-06-02 ENCOUNTER — Other Ambulatory Visit: Payer: Self-pay

## 2018-06-02 ENCOUNTER — Ambulatory Visit (INDEPENDENT_AMBULATORY_CARE_PROVIDER_SITE_OTHER): Payer: Medicare Other | Admitting: Vascular Surgery

## 2018-06-02 ENCOUNTER — Encounter (INDEPENDENT_AMBULATORY_CARE_PROVIDER_SITE_OTHER): Payer: Self-pay | Admitting: Vascular Surgery

## 2018-06-02 VITALS — BP 160/92 | HR 90 | Resp 16 | Ht 67.0 in | Wt 181.4 lb

## 2018-06-02 DIAGNOSIS — E1165 Type 2 diabetes mellitus with hyperglycemia: Secondary | ICD-10-CM | POA: Diagnosis present

## 2018-06-02 DIAGNOSIS — Z7901 Long term (current) use of anticoagulants: Secondary | ICD-10-CM

## 2018-06-02 DIAGNOSIS — Z91041 Radiographic dye allergy status: Secondary | ICD-10-CM

## 2018-06-02 DIAGNOSIS — I4891 Unspecified atrial fibrillation: Secondary | ICD-10-CM

## 2018-06-02 DIAGNOSIS — Z8249 Family history of ischemic heart disease and other diseases of the circulatory system: Secondary | ICD-10-CM

## 2018-06-02 DIAGNOSIS — Z89512 Acquired absence of left leg below knee: Secondary | ICD-10-CM

## 2018-06-02 DIAGNOSIS — R4701 Aphasia: Secondary | ICD-10-CM | POA: Diagnosis present

## 2018-06-02 DIAGNOSIS — I130 Hypertensive heart and chronic kidney disease with heart failure and stage 1 through stage 4 chronic kidney disease, or unspecified chronic kidney disease: Secondary | ICD-10-CM | POA: Diagnosis present

## 2018-06-02 DIAGNOSIS — Z888 Allergy status to other drugs, medicaments and biological substances status: Secondary | ICD-10-CM

## 2018-06-02 DIAGNOSIS — I482 Chronic atrial fibrillation, unspecified: Secondary | ICD-10-CM | POA: Diagnosis present

## 2018-06-02 DIAGNOSIS — E785 Hyperlipidemia, unspecified: Secondary | ICD-10-CM

## 2018-06-02 DIAGNOSIS — E119 Type 2 diabetes mellitus without complications: Secondary | ICD-10-CM | POA: Diagnosis not present

## 2018-06-02 DIAGNOSIS — I639 Cerebral infarction, unspecified: Secondary | ICD-10-CM

## 2018-06-02 DIAGNOSIS — I739 Peripheral vascular disease, unspecified: Secondary | ICD-10-CM | POA: Diagnosis present

## 2018-06-02 DIAGNOSIS — I10 Essential (primary) hypertension: Secondary | ICD-10-CM | POA: Diagnosis present

## 2018-06-02 DIAGNOSIS — E1152 Type 2 diabetes mellitus with diabetic peripheral angiopathy with gangrene: Secondary | ICD-10-CM | POA: Diagnosis present

## 2018-06-02 DIAGNOSIS — I5042 Chronic combined systolic (congestive) and diastolic (congestive) heart failure: Secondary | ICD-10-CM | POA: Diagnosis present

## 2018-06-02 DIAGNOSIS — Z7982 Long term (current) use of aspirin: Secondary | ICD-10-CM

## 2018-06-02 DIAGNOSIS — G4733 Obstructive sleep apnea (adult) (pediatric): Secondary | ICD-10-CM | POA: Diagnosis present

## 2018-06-02 DIAGNOSIS — I340 Nonrheumatic mitral (valve) insufficiency: Secondary | ICD-10-CM | POA: Diagnosis present

## 2018-06-02 DIAGNOSIS — Z9049 Acquired absence of other specified parts of digestive tract: Secondary | ICD-10-CM

## 2018-06-02 DIAGNOSIS — Z833 Family history of diabetes mellitus: Secondary | ICD-10-CM

## 2018-06-02 DIAGNOSIS — R471 Dysarthria and anarthria: Secondary | ICD-10-CM | POA: Diagnosis present

## 2018-06-02 DIAGNOSIS — Z79899 Other long term (current) drug therapy: Secondary | ICD-10-CM

## 2018-06-02 DIAGNOSIS — M79605 Pain in left leg: Secondary | ICD-10-CM

## 2018-06-02 DIAGNOSIS — I2510 Atherosclerotic heart disease of native coronary artery without angina pectoris: Secondary | ICD-10-CM | POA: Diagnosis present

## 2018-06-02 DIAGNOSIS — Z87891 Personal history of nicotine dependence: Secondary | ICD-10-CM

## 2018-06-02 DIAGNOSIS — Z91040 Latex allergy status: Secondary | ICD-10-CM

## 2018-06-02 DIAGNOSIS — R079 Chest pain, unspecified: Secondary | ICD-10-CM | POA: Diagnosis not present

## 2018-06-02 DIAGNOSIS — Z9079 Acquired absence of other genital organ(s): Secondary | ICD-10-CM

## 2018-06-02 DIAGNOSIS — N183 Chronic kidney disease, stage 3 (moderate): Secondary | ICD-10-CM | POA: Diagnosis present

## 2018-06-02 DIAGNOSIS — R4702 Dysphasia: Secondary | ICD-10-CM | POA: Diagnosis present

## 2018-06-02 DIAGNOSIS — Z794 Long term (current) use of insulin: Secondary | ICD-10-CM

## 2018-06-02 DIAGNOSIS — E118 Type 2 diabetes mellitus with unspecified complications: Secondary | ICD-10-CM

## 2018-06-02 DIAGNOSIS — I214 Non-ST elevation (NSTEMI) myocardial infarction: Secondary | ICD-10-CM

## 2018-06-02 DIAGNOSIS — I615 Nontraumatic intracerebral hemorrhage, intraventricular: Secondary | ICD-10-CM | POA: Diagnosis not present

## 2018-06-02 DIAGNOSIS — E1122 Type 2 diabetes mellitus with diabetic chronic kidney disease: Secondary | ICD-10-CM | POA: Diagnosis present

## 2018-06-02 DIAGNOSIS — Z8546 Personal history of malignant neoplasm of prostate: Secondary | ICD-10-CM

## 2018-06-02 DIAGNOSIS — I5032 Chronic diastolic (congestive) heart failure: Secondary | ICD-10-CM | POA: Diagnosis present

## 2018-06-02 DIAGNOSIS — R739 Hyperglycemia, unspecified: Secondary | ICD-10-CM

## 2018-06-02 DIAGNOSIS — I429 Cardiomyopathy, unspecified: Secondary | ICD-10-CM | POA: Diagnosis present

## 2018-06-02 MED ORDER — DILTIAZEM HCL 25 MG/5ML IV SOLN
10.0000 mg | Freq: Once | INTRAVENOUS | Status: AC
  Administered 2018-06-02: 10 mg via INTRAVENOUS
  Filled 2018-06-02: qty 5

## 2018-06-02 NOTE — ED Notes (Signed)
[facility] [phone]

## 2018-06-02 NOTE — Assessment & Plan Note (Signed)
Although his BKA stump is well-healed, he is having a lot of issues with his prosthesis.  Although he has many comorbidities, none of these should impair his ability to walk with a prosthesis.  His current prosthesis appears to be ill fitting due to volume loss.  The patient verbally communicates a strong desire to get a new prosthetic socket.  He is having a lot of pain with his current prosthesis.  The patient is a K2 level ambulator that spends some time walking around on uneven terrain, obstacles, and sometimes does have to walk backwards.  I think the patient would benefit from a new prosthetic socket to improve fit and function.  This should help his mobility and improve his pain.

## 2018-06-02 NOTE — ED Notes (Signed)
Pt up to toilet to urinate at this time. Pt given urinal for future use to decrease physical stress with ambulation

## 2018-06-02 NOTE — Progress Notes (Signed)
MRN : [id number]  [name] is a 84 y.o. (Jun 11, 1934) male who presents with chief complaint of  Chief Complaint  Patient presents with  . Follow-up  .  History of Present Illness: Patient returns today in follow up of his left BKA prosthesis.  He is having a lot of pain from his prosthesis and has not been able to get this to fit appropriately for some time.  It sounds as if he may need a new prosthesis based off of a recent evaluation.  He does not have any current symptoms in the right leg that are lifestyle limiting or limb threatening.  Current Outpatient Medications  Medication Sig Dispense Refill  . aspirin EC 81 MG tablet Take 81 mg by mouth daily.    Marland Kitchen atorvastatin (LIPITOR) 80 MG tablet Take 80 mg by mouth daily at 6 PM.     . collagenase (SANTYL) ointment Apply topically daily. Right leg wound 15 g 0  . digoxin (LANOXIN) 0.125 MG tablet Take 1 tablet (0.125 mg total) by mouth daily. 30 tablet 2  . diltiazem (CARDIZEM CD) 120 MG 24 hr capsule Take 1 capsule (120 mg total) by mouth daily. 30 capsule 0  . empagliflozin (JARDIANCE) 25 MG TABS tablet Take 25 mg by mouth daily.    . furosemide (LASIX) 20 MG tablet Take 1 tablet (20 mg total) by mouth daily. 30 tablet 2  . HYDROcodone-acetaminophen (NORCO/VICODIN) 5-325 MG tablet 1-2 tabs po qd prn 6 tablet 0  . insulin glargine (LANTUS) 100 UNIT/ML injection Inject 0.1 mLs (10 Units total) into the skin at bedtime. (Patient taking differently: Inject 15 Units into the skin at bedtime. ) 10 mL 11  . insulin lispro (HUMALOG) 100 UNIT/ML injection Inject 0.03 mLs (3 Units total) into the skin 3 (three) times daily with meals. (Patient taking differently: Inject 3 Units into the skin 3 (three) times daily with meals. Plus 1 additional unit for every 50 BG points over 200 (max 5 additional units per dose)) 10 mL 0  . isosorbide mononitrate (IMDUR) 30 MG 24 hr tablet Take 1 tablet (30 mg total) by mouth daily. 30 tablet 2  .  lisinopril (PRINIVIL,ZESTRIL) 2.5 MG tablet Take 1 tablet (2.5 mg total) by mouth daily. 30 tablet 0  . metoprolol tartrate (LOPRESSOR) 25 MG tablet Take 1 tablet (25 mg total) by mouth 3 (three) times daily. 90 tablet 0  . polyethylene glycol (MIRALAX / GLYCOLAX) packet Take 17 g by mouth daily. 30 each 0  . potassium chloride SA (K-DUR,KLOR-CON) 20 MEQ tablet Take 20 mEq by mouth daily.    . predniSONE (DELTASONE) 20 MG tablet 2 tabs po qd x 3 days, then 1 tab po qd x 3 days, then half a tab po qd x 2 days 10 tablet 0  . risperiDONE (RISPERDAL) 0.5 MG tablet Take 1 tablet (0.5 mg total) by mouth at bedtime. 30 tablet 0  . rivaroxaban (XARELTO) 20 MG TABS tablet Take 1 tablet (20 mg total) by mouth daily with supper. 30 tablet 0   No current facility-administered medications for this visit.         Past Medical History:  Diagnosis Date  . Arthritis   . Atrial fibrillation ([place])   . Carcinoma of prostate ([place])   . CHF (congestive heart failure) ([place])   . Chronic kidney disease   . Diabetes mellitus without complication ([place])   . ED (erectile dysfunction)   . Frequent urination   .  Hyperlipidemia   . Hypertension   . Moderate mitral insufficiency   . Peripheral vascular disease ([place])   . Pneumonia 04/2016  . Prostate cancer ([place])   . Sleep apnea    OSA--USE C-PAP  . Ulcer of left foot due to type 2 diabetes mellitus ([place])   . Urinary stress incontinence, male          Past Surgical History:  Procedure Laterality Date  . AMPUTATION Left 01/12/2017   Procedure: AMPUTATION BELOW KNEE;  Surgeon: Algernon Huxley, MD;  Location: [facility];  Service: General;  Laterality: Left;  . APLIGRAFT PLACEMENT Left 10/15/2016   Procedure: APLIGRAFT PLACEMENT;  Surgeon: Albertine Patricia, DPM;  Location: [facility];  Service: Podiatry;  Laterality: Left;  necrotic ulcer  . CHOLECYSTECTOMY    . EYE SURGERY Bilateral    Cataract Extraction with IOL  . IRRIGATION AND  DEBRIDEMENT FOOT Left 10/15/2016   Procedure: IRRIGATION AND DEBRIDEMENT FOOT-EXCISIONAL DEBRIDEMENT OF SKIN 3RD, 4TH AND 5TH TOES WITH APPLICATION OF APLIGRAFT;  Surgeon: Albertine Patricia, DPM;  Location: [facility];  Service: Podiatry;  Laterality: Left;  necrotic,gangrene  . IRRIGATION AND DEBRIDEMENT FOOT Left 12/09/2016   Procedure: IRRIGATION AND DEBRIDEMENT FOOT-MUSCLE/FASCIA, RESECTION OF FOURTH AND FIFTH METATARSAL NECROTIC BONE;  Surgeon: Albertine Patricia, DPM;  Location: [facility];  Service: Podiatry;  Laterality: Left;  . LOWER EXTREMITY ANGIOGRAPHY Left 08/16/2016   Procedure: Lower Extremity Angiography;  Surgeon: Algernon Huxley, MD;  Location: [place] [facility];  Service: Cardiovascular;  Laterality: Left;  . LOWER EXTREMITY ANGIOGRAPHY Left 09/01/2016   Procedure: Lower Extremity Angiography;  Surgeon: Algernon Huxley, MD;  Location: [place] [facility];  Service: Cardiovascular;  Laterality: Left;  . LOWER EXTREMITY ANGIOGRAPHY Left 10/14/2016   Procedure: Lower Extremity Angiography;  Surgeon: Algernon Huxley, MD;  Location: [place] [facility];  Service: Cardiovascular;  Laterality: Left;  . LOWER EXTREMITY ANGIOGRAPHY Left 12/20/2016   Procedure: Lower Extremity Angiography;  Surgeon: Algernon Huxley, MD;  Location: [place] [facility];  Service: Cardiovascular;  Laterality: Left;  . LOWER EXTREMITY INTERVENTION  09/01/2016   Procedure: Lower Extremity Intervention;  Surgeon: Algernon Huxley, MD;  Location: [place] [facility];  Service: Cardiovascular;;  . PROSTATE SURGERY     removal  . TONSILLECTOMY     as a child        Family History  Problem Relation Age of Onset  . Prostate cancer Neg Hx   . Bladder Cancer Neg Hx   . Kidney disease Neg Hx   No bleeding disorders, clotting disorders, autoimmune diseases, or aneurysms  Social History      Social History  Substance Use Topics  . Smoking status: Former Smoker    Quit date: 10/14/1994  . Smokeless tobacco:  Never Used  . Alcohol use No  Married, wife accompanies today        Allergies  Allergen Reactions  . Contrast Media [Iodinated Diagnostic Agents]     SOB  . Iohexol     Desc: Respiratory Distress, Laryngedema, diaphoresis, Onset Date: 16109604   . Latex Rash       REVIEW OF SYSTEMS(Negative unless checked)  Constitutional: [] ?Weight loss[] ?Fever[] ?Chills Cardiac:[] ?Chest pain[] ?Chest pressure[x] ?Palpitations [] ?Shortness of breath when laying flat [] ?Shortness of breath at rest [x] ?Shortness of breath with exertion. Vascular: [x] ?Pain in legs with walking[] ?Pain in legsat rest[] ?Pain in legs when laying flat [] ?Claudication [x] ?Pain in feet when walking [x] ?Pain in feet at rest [x] ?Pain in feet when laying flat [] ?History of  DVT [] ?Phlebitis [x] ?Swelling in legs [] ?Varicose veins [x] ?Non-healing ulcers Pulmonary: [] ?Uses home oxygen [x] ?Productive cough[] ?Hemoptysis [] ?Wheeze [] ?COPD [] ?Asthma Neurologic: [] ?Dizziness [] ?Blackouts [] ?Seizures [] ?History of stroke [] ?History of TIA[] ?Aphasia [] ?Temporary blindness[] ?Dysphagia [] ?Weaknessor numbness in arms [] ?Weakness or numbnessin legs Musculoskeletal: [x] ?Arthritis [] ?Joint swelling [] ?Joint pain [] ?Low back pain Hematologic:[] ?Easy bruising[] ?Easy bleeding [] ?Hypercoagulable state [] ?Anemic [] ?Hepatitis Gastrointestinal:[] ?Blood in stool[] ?Vomiting blood[] ?Gastroesophageal reflux/heartburn[] ?Abdominal pain Genitourinary: [] ?Chronic kidney disease [] ?Difficulturination [] ?Frequenturination [] ?Burning with urination[] ?Hematuria Skin: [] ?Rashes [x] ?Ulcers [x] ?Wounds Psychological: [] ?History of anxiety[] ?History of major depression.    Physical Examination  BP (!) 160/92 (BP Location: Right Arm, Patient Position: Sitting)   Pulse 90   Resp 16   Ht 5\' 7"  (1.702 m)   Wt 181 lb 6.4 oz  (82.3 kg)   BMI 28.41 kg/m  Gen:  WD/WN, NAD Head: [place]/AT, No temporalis wasting. Ear/Nose/Throat: Hearing grossly intact, nares w/o erythema or drainage Eyes: Conjunctiva clear. Sclera non-icteric Neck: Supple.  Trachea midline Pulmonary:  Good air movement, no use of accessory muscles.  Cardiac: Irregular Vascular:  Vessel Right Left  Radial Palpable Palpable                          PT Not Palpable Not Palpable  DP 1+ Palpable Not Palpable    Musculoskeletal: M/S 5/5 throughout.  No deformity or atrophy. Left BKA well healed Neurologic: Sensation grossly intact in extremities.  Symmetrical.  Speech is fluent.  Psychiatric: Judgment intact, Mood & affect appropriate for pt's clinical situation. Dermatologic: No rashes or ulcers noted.  No cellulitis or open wounds.       Labs No results found for this or any previous visit (from the past 2160 hour(s)).  Radiology No results found.  Assessment/Plan Hyperlipidemia lipid control important in reducing the progression of atherosclerotic disease. Continue statin therapy   Type 2 diabetes mellitus treated with insulin (HCC) blood glucose control important in reducing the progression of atherosclerotic disease. Also, involved in wound healing. On appropriate medications.   Essential hypertension blood pressure control important in reducing the progression of atherosclerotic disease. On appropriate oral medications.  A-fib (HCC) On anticoagulation. Rapid A. fib lead to a prolonged hospitalization lastyear.  S/P BKA (below knee amputation) unilateral, left ([place]) Although his BKA stump is well-healed, he is having a lot of issues with his prosthesis.  Although he has many comorbidities, none of these should impair his ability to walk with a prosthesis.  His current prosthesis appears to be ill fitting due to volume loss.  The patient verbally communicates a strong desire to get a new prosthetic socket.  He is having  a lot of pain with his current prosthesis.  The patient is a K2 level ambulator that spends some time walking around on uneven terrain, obstacles, and sometimes does have to walk backwards.  I think the patient would benefit from a new prosthetic socket to improve fit and function.  This should help his mobility and improve his pain.    Leotis Pain, MD  06/02/2018 12:44 PM    This note was created with Dragon medical transcription system.  Any errors from dictation are purely unintentional

## 2018-06-02 NOTE — ED Triage Notes (Signed)
Pt arrives via ems from home with reports of substernal chest pain starting around 730. Pt took 324 asa prior to ems arrival, sl spray nitro 2200. Pt alert and oriented on arrival. Pt able to ambulate from stretcher to toilet to bed. Hx of afib, MI 3 months ago. Pt joking during triage and laughing. Pain currently at 3/10.

## 2018-06-03 ENCOUNTER — Observation Stay
Admit: 2018-06-03 | Discharge: 2018-06-03 | Disposition: A | Discharge status: Home / Self Care | Payer: Medicare Other | Attending: Internal Medicine | Admitting: Internal Medicine

## 2018-06-03 DIAGNOSIS — Z79899 Other long term (current) drug therapy: Secondary | ICD-10-CM | POA: Diagnosis not present

## 2018-06-03 DIAGNOSIS — R739 Hyperglycemia, unspecified: Secondary | ICD-10-CM | POA: Diagnosis not present

## 2018-06-03 DIAGNOSIS — I639 Cerebral infarction, unspecified: Secondary | ICD-10-CM | POA: Diagnosis not present

## 2018-06-03 DIAGNOSIS — R4701 Aphasia: Secondary | ICD-10-CM | POA: Diagnosis present

## 2018-06-03 DIAGNOSIS — N183 Chronic kidney disease, stage 3 (moderate): Secondary | ICD-10-CM | POA: Diagnosis present

## 2018-06-03 DIAGNOSIS — R471 Dysarthria and anarthria: Secondary | ICD-10-CM | POA: Diagnosis present

## 2018-06-03 DIAGNOSIS — R079 Chest pain, unspecified: Secondary | ICD-10-CM | POA: Insufficient documentation

## 2018-06-03 DIAGNOSIS — E1152 Type 2 diabetes mellitus with diabetic peripheral angiopathy with gangrene: Secondary | ICD-10-CM | POA: Diagnosis present

## 2018-06-03 DIAGNOSIS — I429 Cardiomyopathy, unspecified: Secondary | ICD-10-CM | POA: Diagnosis present

## 2018-06-03 DIAGNOSIS — I5042 Chronic combined systolic (congestive) and diastolic (congestive) heart failure: Secondary | ICD-10-CM | POA: Diagnosis present

## 2018-06-03 DIAGNOSIS — Z833 Family history of diabetes mellitus: Secondary | ICD-10-CM | POA: Diagnosis not present

## 2018-06-03 DIAGNOSIS — E785 Hyperlipidemia, unspecified: Secondary | ICD-10-CM | POA: Diagnosis present

## 2018-06-03 DIAGNOSIS — Z9049 Acquired absence of other specified parts of digestive tract: Secondary | ICD-10-CM | POA: Diagnosis not present

## 2018-06-03 DIAGNOSIS — I482 Chronic atrial fibrillation, unspecified: Secondary | ICD-10-CM | POA: Diagnosis present

## 2018-06-03 DIAGNOSIS — I340 Nonrheumatic mitral (valve) insufficiency: Secondary | ICD-10-CM | POA: Diagnosis present

## 2018-06-03 DIAGNOSIS — Z794 Long term (current) use of insulin: Secondary | ICD-10-CM | POA: Diagnosis not present

## 2018-06-03 DIAGNOSIS — I4891 Unspecified atrial fibrillation: Secondary | ICD-10-CM | POA: Diagnosis not present

## 2018-06-03 DIAGNOSIS — G4733 Obstructive sleep apnea (adult) (pediatric): Secondary | ICD-10-CM | POA: Diagnosis present

## 2018-06-03 DIAGNOSIS — R4702 Dysphasia: Secondary | ICD-10-CM | POA: Diagnosis present

## 2018-06-03 DIAGNOSIS — Z89512 Acquired absence of left leg below knee: Secondary | ICD-10-CM | POA: Diagnosis not present

## 2018-06-03 DIAGNOSIS — I214 Non-ST elevation (NSTEMI) myocardial infarction: Secondary | ICD-10-CM | POA: Diagnosis present

## 2018-06-03 DIAGNOSIS — E1165 Type 2 diabetes mellitus with hyperglycemia: Secondary | ICD-10-CM | POA: Diagnosis present

## 2018-06-03 DIAGNOSIS — I615 Nontraumatic intracerebral hemorrhage, intraventricular: Secondary | ICD-10-CM | POA: Diagnosis not present

## 2018-06-03 DIAGNOSIS — Z7901 Long term (current) use of anticoagulants: Secondary | ICD-10-CM | POA: Diagnosis not present

## 2018-06-03 DIAGNOSIS — E1122 Type 2 diabetes mellitus with diabetic chronic kidney disease: Secondary | ICD-10-CM | POA: Diagnosis present

## 2018-06-03 DIAGNOSIS — I2510 Atherosclerotic heart disease of native coronary artery without angina pectoris: Secondary | ICD-10-CM | POA: Diagnosis present

## 2018-06-03 DIAGNOSIS — I130 Hypertensive heart and chronic kidney disease with heart failure and stage 1 through stage 4 chronic kidney disease, or unspecified chronic kidney disease: Secondary | ICD-10-CM | POA: Diagnosis present

## 2018-06-03 DIAGNOSIS — Z7982 Long term (current) use of aspirin: Secondary | ICD-10-CM | POA: Diagnosis not present

## 2018-06-03 LAB — BASIC METABOLIC PANEL
Anion gap: 13 (ref 5–15)
Anion gap: 10 (ref 5–15)
BUN: 22 mg/dL (ref 8–23)
BUN: 23 mg/dL (ref 8–23)
CO2: 24 mmol/L (ref 22–32)
CO2: 23 mmol/L (ref 22–32)
Calcium: 8.5 mg/dL — ABNORMAL LOW (ref 8.9–10.3)
Calcium: 8.6 mg/dL — ABNORMAL LOW (ref 8.9–10.3)
Chloride: 96 mmol/L — ABNORMAL LOW (ref 98–111)
Chloride: 103 mmol/L (ref 98–111)
Creatinine, Ser: 1.33 mg/dL — ABNORMAL HIGH (ref 0.61–1.24)
Creatinine, Ser: 1.19 mg/dL (ref 0.61–1.24)
GFR calc Af Amer: 58 mL/min — ABNORMAL LOW (ref >60)
GFR calc Af Amer: >60 mL/min (ref >60)
GFR calc non Af Amer: 50 mL/min — ABNORMAL LOW (ref >60)
GFR calc non Af Amer: 57 mL/min — ABNORMAL LOW (ref >60)
GLUCOSE: 330 mg/dL — AB (ref 70–99)
Glucose, Bld: 517 mg/dL (ref 70–99)
Potassium: 4.1 mmol/L (ref 3.5–5.1)
Potassium: 4.1 mmol/L (ref 3.5–5.1)
SODIUM: 136 mmol/L (ref 135–145)
Sodium: 133 mmol/L — ABNORMAL LOW (ref 135–145)

## 2018-06-03 LAB — ECHOCARDIOGRAM COMPLETE
Height: 67 in
Weight: 2824 oz

## 2018-06-03 LAB — CBC
HCT: 50.4 % (ref 39.0–52.0)
HEMATOCRIT: 45.4 % (ref 39.0–52.0)
Hemoglobin: 16.3 g/dL (ref 13.0–17.0)
Hemoglobin: 14.7 g/dL (ref 13.0–17.0)
MCH: 26.4 pg (ref 26.0–34.0)
MCH: 26.7 pg (ref 26.0–34.0)
MCHC: 32.3 g/dL (ref 30.0–36.0)
MCHC: 32.4 g/dL (ref 30.0–36.0)
MCV: 81.6 fL (ref 80.0–100.0)
MCV: 82.5 fL (ref 80.0–100.0)
PLATELETS: 278 10*3/uL (ref 150–400)
Platelets: 268 10*3/uL (ref 150–400)
RBC: 6.18 MIL/uL — AB (ref 4.22–5.81)
RBC: 5.5 MIL/uL (ref 4.22–5.81)
RDW: 16.1 % — AB (ref 11.5–15.5)
RDW: 15.3 % (ref 11.5–15.5)
WBC: 12.1 10*3/uL — ABNORMAL HIGH (ref 4.0–10.5)
WBC: 10.6 10*3/uL — ABNORMAL HIGH (ref 4.0–10.5)
nRBC: 0 % (ref 0.0–0.2)
nRBC: 0 % (ref 0.0–0.2)

## 2018-06-03 LAB — GLUCOSE, CAPILLARY
GLUCOSE-CAPILLARY: 292 mg/dL — AB (ref 70–99)
Glucose-Capillary: 339 mg/dL — ABNORMAL HIGH (ref 70–99)
Glucose-Capillary: 314 mg/dL — ABNORMAL HIGH (ref 70–99)
Glucose-Capillary: 177 mg/dL — ABNORMAL HIGH (ref 70–99)
Glucose-Capillary: 290 mg/dL — ABNORMAL HIGH (ref 70–99)
Glucose-Capillary: 223 mg/dL — ABNORMAL HIGH (ref 70–99)
Glucose-Capillary: 310 mg/dL — ABNORMAL HIGH (ref 70–99)

## 2018-06-03 LAB — MRSA PCR SCREENING: MRSA by PCR: NEGATIVE

## 2018-06-03 LAB — TROPONIN I
TROPONIN I: 0.07 ng/mL — AB (ref <0.03)
TROPONIN I: 2.34 ng/mL — AB (ref <0.03)
Troponin I: 2.48 ng/mL (ref <0.03)
Troponin I: 2.76 ng/mL (ref <0.03)

## 2018-06-03 LAB — PROTIME-INR
INR: 1.58
Prothrombin Time: 18.7 seconds — ABNORMAL HIGH (ref 11.4–15.2)

## 2018-06-03 LAB — APTT
aPTT: 33 seconds (ref 24–36)
aPTT: 58 seconds — ABNORMAL HIGH (ref 24–36)

## 2018-06-03 LAB — HEPARIN LEVEL (UNFRACTIONATED): Heparin Unfractionated: 1.57 IU/mL — ABNORMAL HIGH (ref 0.30–0.70)

## 2018-06-03 MED ORDER — ONDANSETRON HCL 4 MG PO TABS: 4.0000 mg | ORAL_TABLET | Freq: Four times a day (QID) | ORAL | Status: DC | PRN

## 2018-06-03 MED ORDER — ONDANSETRON HCL 4 MG/2ML IJ SOLN
4.0000 mg | Freq: Four times a day (QID) | INTRAMUSCULAR | Status: DC | PRN
  Administered 2018-06-08: 4 mg via INTRAVENOUS
  Filled 2018-06-03: qty 2

## 2018-06-03 MED ORDER — METOPROLOL TARTRATE 25 MG PO TABS
25.0000 mg | ORAL_TABLET | Freq: Three times a day (TID) | ORAL | Status: DC
  Administered 2018-06-03: 25 mg via ORAL
  Filled 2018-06-03: qty 1

## 2018-06-03 MED ORDER — FUROSEMIDE 20 MG PO TABS
20.0000 mg | ORAL_TABLET | Freq: Every day | ORAL | Status: DC
  Administered 2018-06-03 – 2018-06-09 (×6): 20 mg via ORAL
  Filled 2018-06-03 (×7): qty 1

## 2018-06-03 MED ORDER — HEPARIN (PORCINE) 25000 UT/250ML-% IV SOLN
1100.0000 [IU]/h | INTRAVENOUS | Status: DC
  Administered 2018-06-03: 950 [IU]/h via INTRAVENOUS
  Administered 2018-06-04 – 2018-06-05 (×2): 1100 [IU]/h via INTRAVENOUS
  Filled 2018-06-03 (×3): qty 250

## 2018-06-03 MED ORDER — INSULIN GLARGINE 100 UNIT/ML ~~LOC~~ SOLN
15.0000 [IU] | Freq: Every day | SUBCUTANEOUS | Status: DC
  Administered 2018-06-03 – 2018-06-07 (×4): 15 [IU] via SUBCUTANEOUS
  Filled 2018-06-03 (×6): qty 0.15

## 2018-06-03 MED ORDER — RIVAROXABAN 20 MG PO TABS: 20.0000 mg | ORAL_TABLET | Freq: Every day | ORAL | Status: DC

## 2018-06-03 MED ORDER — ACETAMINOPHEN 650 MG RE SUPP: 650.0000 mg | Freq: Four times a day (QID) | RECTAL | Status: DC | PRN

## 2018-06-03 MED ORDER — METOPROLOL TARTRATE 50 MG PO TABS: 75.0000 mg | ORAL_TABLET | Freq: Two times a day (BID) | ORAL | Status: DC

## 2018-06-03 MED ORDER — ATORVASTATIN CALCIUM 20 MG PO TABS
80.0000 mg | ORAL_TABLET | Freq: Every day | ORAL | Status: DC
  Administered 2018-06-03 – 2018-06-08 (×5): 80 mg via ORAL
  Filled 2018-06-03 (×2): qty 4
  Filled 2018-06-03: qty 8
  Filled 2018-06-03 (×2): qty 4

## 2018-06-03 MED ORDER — OXYCODONE HCL 5 MG PO TABS
5.0000 mg | ORAL_TABLET | ORAL | Status: DC | PRN
  Administered 2018-06-04: 5 mg via ORAL
  Filled 2018-06-03: qty 1

## 2018-06-03 MED ORDER — DILTIAZEM HCL 30 MG PO TABS
120.0000 mg | ORAL_TABLET | Freq: Every day | ORAL | Status: DC
  Administered 2018-06-03 – 2018-06-04 (×2): 120 mg via ORAL
  Filled 2018-06-03 (×2): qty 4

## 2018-06-03 MED ORDER — LISINOPRIL 5 MG PO TABS
2.5000 mg | ORAL_TABLET | Freq: Every day | ORAL | Status: DC
  Administered 2018-06-03: 2.5 mg via ORAL
  Filled 2018-06-03: qty 1

## 2018-06-03 MED ORDER — ASPIRIN EC 81 MG PO TBEC
81.0000 mg | DELAYED_RELEASE_TABLET | Freq: Every day | ORAL | Status: DC
  Administered 2018-06-03 – 2018-06-04 (×2): 81 mg via ORAL
  Filled 2018-06-03 (×2): qty 1

## 2018-06-03 MED ORDER — ISOSORBIDE MONONITRATE ER 30 MG PO TB24
30.0000 mg | ORAL_TABLET | Freq: Every day | ORAL | Status: DC
  Administered 2018-06-03: 30 mg via ORAL
  Filled 2018-06-03: qty 1

## 2018-06-03 MED ORDER — METOPROLOL TARTRATE 25 MG PO TABS
25.0000 mg | ORAL_TABLET | Freq: Two times a day (BID) | ORAL | Status: DC
  Administered 2018-06-04 – 2018-06-09 (×9): 25 mg via ORAL
  Filled 2018-06-03 (×11): qty 1

## 2018-06-03 MED ORDER — INSULIN ASPART 100 UNIT/ML ~~LOC~~ SOLN
8.0000 [IU] | Freq: Once | SUBCUTANEOUS | Status: AC
  Administered 2018-06-03: 8 [IU] via INTRAVENOUS
  Filled 2018-06-03: qty 1

## 2018-06-03 MED ORDER — RISPERIDONE 0.5 MG PO TABS
0.5000 mg | ORAL_TABLET | Freq: Every day | ORAL | Status: DC
  Administered 2018-06-03 – 2018-06-07 (×4): 0.5 mg via ORAL
  Filled 2018-06-03 (×8): qty 1

## 2018-06-03 MED ORDER — ACETAMINOPHEN 325 MG PO TABS: 650.0000 mg | ORAL_TABLET | Freq: Four times a day (QID) | ORAL | Status: DC | PRN

## 2018-06-03 MED ORDER — DIGOXIN 125 MCG PO TABS
0.1250 mg | ORAL_TABLET | Freq: Every day | ORAL | Status: DC
  Administered 2018-06-04 – 2018-06-09 (×5): 0.125 mg via ORAL
  Filled 2018-06-03 (×7): qty 1

## 2018-06-03 MED ORDER — INSULIN ASPART 100 UNIT/ML ~~LOC~~ SOLN
0.0000 [IU] | Freq: Three times a day (TID) | SUBCUTANEOUS | Status: DC
  Administered 2018-06-03: 3 [IU] via SUBCUTANEOUS
  Administered 2018-06-03: 8 [IU] via SUBCUTANEOUS
  Administered 2018-06-03: 11 [IU] via SUBCUTANEOUS
  Administered 2018-06-03: 8 [IU] via SUBCUTANEOUS
  Administered 2018-06-03: 5 [IU] via SUBCUTANEOUS
  Administered 2018-06-04 (×2): 3 [IU] via SUBCUTANEOUS
  Administered 2018-06-04: 11 [IU] via SUBCUTANEOUS
  Administered 2018-06-04 – 2018-06-05 (×2): 2 [IU] via SUBCUTANEOUS
  Administered 2018-06-05 (×2): 3 [IU] via SUBCUTANEOUS
  Administered 2018-06-06 (×2): 5 [IU] via SUBCUTANEOUS
  Administered 2018-06-06 – 2018-06-07 (×2): 3 [IU] via SUBCUTANEOUS
  Administered 2018-06-07: 8 [IU] via SUBCUTANEOUS
  Administered 2018-06-07: 2 [IU] via SUBCUTANEOUS
  Administered 2018-06-07 – 2018-06-08 (×2): 5 [IU] via SUBCUTANEOUS
  Administered 2018-06-08: 3 [IU] via SUBCUTANEOUS
  Administered 2018-06-08: 5 [IU] via SUBCUTANEOUS
  Administered 2018-06-08 – 2018-06-09 (×2): 8 [IU] via SUBCUTANEOUS
  Administered 2018-06-09: 5 [IU] via SUBCUTANEOUS
  Filled 2018-06-03 (×24): qty 1

## 2018-06-03 NOTE — Progress Notes (Addendum)
[place] for heparin Indication: chest pain/ACS  Allergies  Allergen Reactions  . Contrast Media [Iodinated Diagnostic Agents] Shortness Of Breath    SOB  . Iohexol Shortness Of Breath     Desc: Respiratory Distress, Laryngedema, diaphoresis, Onset Date: 12197588   . Metrizamide Shortness Of Breath    SOB SOB SOB   . Latex Rash    Patient Measurements: Height: 5\' 7"  (170.2 cm) Weight: 176 lb 8 oz (80.1 kg) IBW/kg (Calculated) : 66.1 Heparin Dosing Weight: 81.6 kg  Vital Signs: Temp: 98 F (36.7 C) (01/11 0730) Temp Source: Oral (01/11 0730) BP: 145/84 (01/11 0730) Pulse Rate: 82 (01/11 0730)  Labs: Recent Labs    06/02/18 2233 06/03/18 0347 06/03/18 0927  HGB 16.3 14.7  --   HCT 50.4 45.4  --   PLT 278 268  --   CREATININE 1.33* 1.19  --   TROPONINI 0.07* 2.48* 2.76*    Estimated Creatinine Clearance: 49.4 mL/min (by C-G formula based on SCr of 1.19 mg/dL).   Medical History: Past Medical History:  Diagnosis Date  . Arthritis   . Atrial fibrillation ([place])   . Carcinoma of prostate ([place])   . CHF (congestive heart failure) ([place])   . Chronic kidney disease   . Diabetes mellitus without complication ([place])   . ED (erectile dysfunction)   . Frequent urination   . Hyperlipidemia   . Hypertension   . Moderate mitral insufficiency   . Peripheral vascular disease ([place])   . Pneumonia 04/2016  . Prostate cancer ([place])   . Sleep apnea    OSA--USE C-PAP  . Ulcer of left foot due to type 2 diabetes mellitus ([place])   . Urinary stress incontinence, male     Assessment: 84 year old male with chest pain. Patient is on Xarelto 20 mg PTA. Per med rec, patient reports last taking 1/10 at 2000.  Goal of Therapy:  aPTT 66-102 seconds Monitor platelets by anticoagulation protocol: Yes   Plan:  Spoke with Dr. Ubaldo Glassing about starting heparin drip. Patient told him he thought he took last dose of Xarelto yesterday morning, but med  rec has last dose at 2000 last night. With unclear time of last dose, we will start heparin drip now with no bolus per Dr. Ubaldo Glassing.  Will start heparin drip at 950 units/hr with NO bolus. APTT ordered for 1900. CBC and HL with morning labs.  Tawnya Crook, PharmD Pharmacy Resident  06/03/2018 10:12 AM

## 2018-06-03 NOTE — H&P (Signed)
[facility] [place] NAME: [name]    MR#:  [id number]  DATE OF BIRTH:  18-Dec-1934  DATE OF ADMISSION:  06/02/2018  PRIMARY CARE PHYSICIAN: Patient, No Pcp Per   REQUESTING/REFERRING PHYSICIAN: Owens Shark, MD  CHIEF COMPLAINT:   Chief Complaint  Patient presents with  . Chest Pain    HISTORY OF PRESENT ILLNESS:  [name]  is a 84 y.o. male who presents with chief complaint as above.  Patient presents to the ED with acute chest pain.  He states that after he had dinner he developed central chest discomfort.  It was pressure-like in characterization, nonradiating, associated with some mild shortness of breath.  He has a history of CAD as well as A. fib and heart failure.  Here in the ED his work-up showed a mildly elevated troponin.  Hospitalist were called for further evaluation  PAST MEDICAL HISTORY:   Past Medical History:  Diagnosis Date  . Arthritis   . Atrial fibrillation ([place])   . Carcinoma of prostate ([place])   . CHF (congestive heart failure) ([place])   . Chronic kidney disease   . Diabetes mellitus without complication ([place])   . ED (erectile dysfunction)   . Frequent urination   . Hyperlipidemia   . Hypertension   . Moderate mitral insufficiency   . Peripheral vascular disease ([place])   . Pneumonia 04/2016  . Prostate cancer ([place])   . Sleep apnea    OSA--USE C-PAP  . Ulcer of left foot due to type 2 diabetes mellitus ([place])   . Urinary stress incontinence, male      PAST SURGICAL HISTORY:   Past Surgical History:  Procedure Laterality Date  . AMPUTATION Left 01/12/2017   Procedure: AMPUTATION BELOW KNEE;  Surgeon: Algernon Huxley, MD;  Location: [facility] OR[facility];  Service: General;  Laterality: Left;  . APLIGRAFT PLACEMENT Left 10/15/2016   Procedure: APLIGRAFT PLACEMENT;  Surgeon: Albertine Patricia, DPM;  Location: [facility];  Service: Podiatry;  Laterality: Left;  necrotic ulcer  . CHOLECYSTECTOMY    . EYE SURGERY  Bilateral    Cataract Extraction with IOL  . IRRIGATION AND DEBRIDEMENT FOOT Left 10/15/2016   Procedure: IRRIGATION AND DEBRIDEMENT FOOT-EXCISIONAL DEBRIDEMENT OF SKIN 3RD, 4TH AND 5TH TOES WITH APPLICATION OF APLIGRAFT;  Surgeon: Albertine Patricia, DPM;  Location: [facility];  Service: Podiatry;  Laterality: Left;  necrotic,gangrene  . IRRIGATION AND DEBRIDEMENT FOOT Left 12/09/2016   Procedure: IRRIGATION AND DEBRIDEMENT FOOT-MUSCLE/FASCIA, RESECTION OF FOURTH AND FIFTH METATARSAL NECROTIC BONE;  Surgeon: Albertine Patricia, DPM;  Location: [facility];  Service: Podiatry;  Laterality: Left;  . LOWER EXTREMITY ANGIOGRAPHY Left 08/16/2016   Procedure: Lower Extremity Angiography;  Surgeon: Algernon Huxley, MD;  Location: [facility];  Service: Cardiovascular;  Laterality: Left;  . LOWER EXTREMITY ANGIOGRAPHY Left 09/01/2016   Procedure: Lower Extremity Angiography;  Surgeon: Algernon Huxley, MD;  Location: [facility];  Service: Cardiovascular;  Laterality: Left;  . LOWER EXTREMITY ANGIOGRAPHY Left 10/14/2016   Procedure: Lower Extremity Angiography;  Surgeon: Algernon Huxley, MD;  Location: [facility];  Service: Cardiovascular;  Laterality: Left;  . LOWER EXTREMITY ANGIOGRAPHY Left 12/20/2016   Procedure: Lower Extremity Angiography;  Surgeon: Algernon Huxley, MD;  Location: [facility];  Service: Cardiovascular;  Laterality: Left;  . LOWER EXTREMITY INTERVENTION  09/01/2016   Procedure: Lower Extremity Intervention;  Surgeon: Algernon Huxley, MD;  Location: [facility];  Service: Cardiovascular;;  . PROSTATE SURGERY     removal  . TONSILLECTOMY     as a child     SOCIAL HISTORY:   Social History   Tobacco Use  . Smoking status: Former Smoker    Packs/day: 0.25    Types: Cigarettes    Last attempt to quit: 10/14/1994    Years since quitting: 23.6  . Smokeless tobacco: Never Used  Substance Use Topics  . Alcohol use: No    Alcohol/week: 0.0 standard drinks      FAMILY HISTORY:   Family History  Problem Relation Age of Onset  . Hypertension Mother   . Diabetes Mother   . Prostate cancer Neg Hx   . Bladder Cancer Neg Hx   . Kidney disease Neg Hx      DRUG ALLERGIES:   Allergies  Allergen Reactions  . Contrast Media [Iodinated Diagnostic Agents] Shortness Of Breath    SOB  . Iohexol Shortness Of Breath     Desc: Respiratory Distress, Laryngedema, diaphoresis, Onset Date: 25956387   . Metrizamide Shortness Of Breath    SOB SOB SOB   . Latex Rash    MEDICATIONS AT HOME:   Prior to Admission medications   Medication Sig Start Date End Date Taking? Authorizing Provider  aspirin EC 81 MG tablet Take 81-324 mg by mouth as needed (chest pain).    Yes [provider]  atorvastatin (LIPITOR) 80 MG tablet Take 80 mg by mouth at bedtime.    Yes [provider]  empagliflozin (JARDIANCE) 25 MG TABS tablet Take 25 mg by mouth daily.   Yes [provider]  insulin glargine (LANTUS) 100 UNIT/ML injection Inject 0.1 mLs (10 Units total) into the skin at bedtime. Patient taking differently: Inject 15 Units into the skin at bedtime.  10/28/17  Yes Mody, Sital, MD  insulin lispro (HUMALOG) 100 UNIT/ML injection Inject 0.03 mLs (3 Units total) into the skin 3 (three) times daily with meals. Patient taking differently: Inject 3 Units into the skin 3 (three) times daily with meals. Plus 1 additional unit for every 50 BG points over 200 (max 5 additional units per dose) 11/28/17  Yes Wieting, Richard, MD  metoprolol tartrate (LOPRESSOR) 25 MG tablet Take 1 tablet (25 mg total) by mouth 3 (three) times daily. 11/28/17  Yes Wieting, Richard, MD  rivaroxaban (XARELTO) 20 MG TABS tablet Take 1 tablet (20 mg total) by mouth daily with supper. 10/28/17  Yes Mody, Ulice Bold, MD  digoxin (LANOXIN) 0.125 MG tablet Take 0.125 mg by mouth daily.    [provider]  diltiazem (CARDIZEM) 120 MG tablet Take 120 mg by mouth daily.     [provider]  furosemide (LASIX) 20 MG tablet Take 1 tablet (20 mg total) by mouth daily. 01/18/18   [name], MD  HYDROcodone-acetaminophen (NORCO/VICODIN) 5-325 MG tablet 1-2 tabs po qd prn Patient not taking: Reported on 06/02/2018 02/14/18   Norval Gable, MD  isosorbide mononitrate (IMDUR) 30 MG 24 hr tablet Take 1 tablet (30 mg total) by mouth daily. 01/19/18   [name], MD  lisinopril (PRINIVIL,ZESTRIL) 2.5 MG tablet Take 1 tablet (2.5 mg total) by mouth daily. 11/28/17 11/28/18  Loletha Grayer, MD  polyethylene glycol (MIRALAX / Floria Raveling) packet Take 17 g by mouth daily. Patient not taking: Reported on 06/02/2018 11/28/17   Loletha Grayer, MD  potassium chloride SA (K-DUR,KLOR-CON) 20 MEQ tablet Take 20 mEq by mouth daily.    [provider]  risperiDONE (  RISPERDAL) 0.5 MG tablet Take 0.5 mg by mouth at bedtime.    [provider]    REVIEW OF SYSTEMS:  Review of Systems  Constitutional: Negative for chills, fever, malaise/fatigue and weight loss.  HENT: Negative for ear pain, hearing loss and tinnitus.   Eyes: Negative for blurred vision, double vision, pain and redness.  Respiratory: Negative for cough, hemoptysis and shortness of breath.   Cardiovascular: Positive for chest pain. Negative for palpitations, orthopnea and leg swelling.  Gastrointestinal: Negative for abdominal pain, constipation, diarrhea, nausea and vomiting.  Genitourinary: Negative for dysuria, frequency and hematuria.  Musculoskeletal: Negative for back pain, joint pain and neck pain.  Skin:       No acne, rash, or lesions  Neurological: Negative for dizziness, tremors, focal weakness and weakness.  Endo/Heme/Allergies: Negative for polydipsia. Does not bruise/bleed easily.  Psychiatric/Behavioral: Negative for depression. The patient is not nervous/anxious and does not have insomnia.      VITAL SIGNS:   Vitals:   06/02/18 2342 06/02/18 2344 06/03/18 0000  06/03/18 0030  BP: (!) 140/91  139/78 (!) 108/93  Pulse: (!) 51 (!) 114 97 89  Resp: 18   18  Temp:      TempSrc:      SpO2: 93% 93% 96% 98%  Weight:      Height:       Wt Readings from Last 3 Encounters:  06/02/18 81.6 kg  06/02/18 82.3 kg  04/28/18 79.8 kg    PHYSICAL EXAMINATION:  Physical Exam  Vitals reviewed. Constitutional: He is oriented to person, place, and time. He appears well-developed and well-nourished. No distress.  HENT:  Head: Normocephalic and atraumatic.  Mouth/Throat: Oropharynx is clear and moist.  Eyes: Pupils are equal, round, and reactive to light. Conjunctivae and EOM are normal. No scleral icterus.  Neck: Normal range of motion. Neck supple. No JVD present. No thyromegaly present.  Cardiovascular: Intact distal pulses. Exam reveals no gallop and no friction rub.  No murmur heard. Irregular rhythm, borderline tachycardic  Respiratory: Effort normal and breath sounds normal. No respiratory distress. He has no wheezes. He has no rales.  GI: Soft. Bowel sounds are normal. He exhibits no distension. There is no abdominal tenderness.  Musculoskeletal: Normal range of motion.        General: No edema.     Comments: No arthritis, no gout  Lymphadenopathy:    He has no cervical adenopathy.  Neurological: He is alert and oriented to person, place, and time. No cranial nerve deficit.  No dysarthria, no aphasia  Skin: Skin is warm and dry. No rash noted. No erythema.  Psychiatric: He has a normal mood and affect. His behavior is normal. Judgment and thought content normal.    LABORATORY PANEL:   CBC Recent Labs  Lab 06/02/18 2233  WBC 12.1*  HGB 16.3  HCT 50.4  PLT 278   ------------------------------------------------------------------------------------------------------------------  Chemistries  Recent Labs  Lab 06/02/18 2233  NA 133*  K 4.1  CL 96*  CO2 24  GLUCOSE 517*  BUN 22  CREATININE 1.33*  CALCIUM 8.5*    ------------------------------------------------------------------------------------------------------------------  Cardiac Enzymes Recent Labs  Lab 06/02/18 2233  TROPONINI 0.07*   ------------------------------------------------------------------------------------------------------------------  RADIOLOGY:  Dg Chest Port 1 View  Result Date: 06/02/2018 CLINICAL DATA:  Substernal chest pain starting at 7:30. History of atrial fibrillation. Myocardial infarct 3 months ago. EXAM: PORTABLE CHEST 1 VIEW COMPARISON:  01/16/2018 FINDINGS: Mild cardiac enlargement. No vascular congestion, edema, or consolidation. No airspace disease  in the lungs. No blunting of costophrenic angles no pneumothorax. Calcification of the aorta. IMPRESSION: Mild cardiac enlargement. No evidence of active pulmonary disease. Electronically Signed   By: Lucienne Capers M.D.   On: 06/02/2018 23:33    EKG:   Orders placed or performed during the hospital encounter of 06/02/18  . EKG 12-Lead  . EKG 12-Lead  . ED EKG  . ED EKG    IMPRESSION AND PLAN:  Principal Problem:   Chest pain -unclear if this is potentially ACS versus demand ischemia from A. fib with RVR.  We will trend his cardiac enzymes, get an echocardiogram and cardiology consult Active Problems:   Atrial fibrillation with rapid ventricular response (HCC) -given diltiazem in the ED with good rate control, continue home meds including anticoagulation   Type 2 diabetes mellitus treated with insulin (HCC) -sliding scale insulin coverage, home dose long-acting insulin   Essential hypertension -home dose antihypertensives   PAD (peripheral artery disease) ([place]) -continue home meds   Chronic diastolic heart failure ([place]) -continue home medications, other work-up as above   Hyperlipidemia -Home dose antilipid  Chart review performed and case discussed with ED provider. Labs, imaging and/or ECG reviewed by provider and discussed with  patient/family. Management plans discussed with the patient and/or family.  DVT PROPHYLAXIS: Systemic anticoagulation  GI PROPHYLAXIS:  None  ADMISSION STATUS: Observation  CODE STATUS: Full Code Status History    Date Active Date Inactive Code Status Order ID Comments User Context   01/16/2018 1515 01/18/2018 1635 Full Code [id number]  Dustin Flock, MD ED   11/23/2017 1346 11/28/2017 2020 Full Code [id number]  Nicholes Mango, MD Inpatient   10/26/2017 1534 10/28/2017 1854 Full Code [id number]  Bettey Costa, MD Inpatient   10/26/2017 1437 10/26/2017 1534 Full Code [id number]  Feliberto Gottron, RN Inpatient   10/24/2017 1152 10/26/2017 1437 DNR [id number]  Bettey Costa, MD Inpatient   10/15/2017 0309 10/24/2017 1152 Full Code [id number]  Arta Silence, MD Inpatient   02/10/2017 1904 02/12/2017 1908 Full Code [id number]  Schnier, Dolores Lory, MD ED   02/10/2017 1638 02/10/2017 1904 Full Code [id number]  Sela Hua, PA-C Outpatient   01/08/2017 1342 01/19/2017 2103 Full Code [id number]  Loletha Grayer, MD ED   12/29/2016 2232 12/30/2016 1634 Full Code [id number]  Lance Coon, MD ED   12/20/2016 1251 12/20/2016 1842 Full Code [id number]  Algernon Huxley, MD Inpatient   10/22/2016 0040 10/24/2016 2058 Full Code [id number]  Lance Coon, MD Inpatient   10/14/2016 1201 10/14/2016 1925 Full Code [id number]  Algernon Huxley, MD Inpatient   08/30/2016 1338 09/06/2016 1941 Full Code [id number]  Epifanio Lesches, MD Inpatient   05/19/2016 0454 05/22/2016 1907 Full Code [id number]  Saundra Shelling, MD Inpatient      TOTAL TIME TAKING CARE OF THIS PATIENT: 40 minutes.   [name] [place] 06/03/2018, 1:13 AM  [employer]  [phone]  CC: Primary care physician; Patient, No Pcp Per  Note:  This document was prepared using Dragon voice recognition software and may include unintentional dictation errors.

## 2018-06-03 NOTE — Progress Notes (Signed)
CRITICAL VALUE ALERT  Critical Value:  Troponin 2.48  Date & Time Notied:  06/03/2018 04:45  Provider Notified: Dr. Marcille Blanco  Orders Received/Actions taken: No new orders at this time

## 2018-06-03 NOTE — ED Notes (Signed)
ED TO INPATIENT HANDOFF REPORT  Name/Age/Gender [name] 84 y.o. male  Code Status Code Status History    Date Active Date Inactive Code Status Order ID Comments User Context   01/16/2018 1515 01/18/2018 1635 Full Code [id number]  Dustin Flock, MD ED   11/23/2017 1346 11/28/2017 2020 Full Code [id number]  Nicholes Mango, MD Inpatient   10/26/2017 1534 10/28/2017 1854 Full Code [id number]  Bettey Costa, MD Inpatient   10/26/2017 1437 10/26/2017 1534 Full Code [id number]  Feliberto Gottron, RN Inpatient   10/24/2017 1152 10/26/2017 1437 DNR [id number]  Bettey Costa, MD Inpatient   10/15/2017 0309 10/24/2017 1152 Full Code [id number]  Arta Silence, MD Inpatient   02/10/2017 1904 02/12/2017 1908 Full Code [id number]  Schnier, Dolores Lory, MD ED   02/10/2017 1638 02/10/2017 1904 Full Code [id number]  Sela Hua, PA-C Outpatient   01/08/2017 1342 01/19/2017 2103 Full Code [id number]  Loletha Grayer, MD ED   12/29/2016 2232 12/30/2016 1634 Full Code [id number]  Lance Coon, MD ED   12/20/2016 1251 12/20/2016 1842 Full Code [id number]  Algernon Huxley, MD Inpatient   10/22/2016 0040 10/24/2016 2058 Full Code [id number]  Lance Coon, MD Inpatient   10/14/2016 1201 10/14/2016 1925 Full Code [id number]  Algernon Huxley, MD Inpatient   08/30/2016 1338 09/06/2016 1941 Full Code [id number]  Epifanio Lesches, MD Inpatient   05/19/2016 0454 05/22/2016 1907 Full Code [id number]  Saundra Shelling, MD Inpatient      Home/SNF/Other Home  Chief Complaint Chest pain  Level of Care/Admitting Diagnosis ED Disposition    ED Disposition Condition [place]: [place] [[id number]]  Level of Care: Telemetry [5]  Diagnosis: Chest pain [741638]  Admitting Physician: Lance Coon [[id number]]  Attending Physician: Lance Coon [[id number]]  Bed request comments: 2a  PT Class (Do Not Modify): Observation [104]  PT Acc Code (Do Not Modify): Observation [10022]       Medical History Past  Medical History:  Diagnosis Date  . Arthritis   . Atrial fibrillation ([place])   . Carcinoma of prostate ([place])   . CHF (congestive heart failure) ([place])   . Chronic kidney disease   . Diabetes mellitus without complication ([place])   . ED (erectile dysfunction)   . Frequent urination   . Hyperlipidemia   . Hypertension   . Moderate mitral insufficiency   . Peripheral vascular disease ([place])   . Pneumonia 04/2016  . Prostate cancer ([place])   . Sleep apnea    OSA--USE C-PAP  . Ulcer of left foot due to type 2 diabetes mellitus ([place])   . Urinary stress incontinence, male     Allergies Allergies  Allergen Reactions  . Contrast Media [Iodinated Diagnostic Agents] Shortness Of Breath    SOB  . Iohexol Shortness Of Breath     Desc: Respiratory Distress, Laryngedema, diaphoresis, Onset Date: 82500370   . Metrizamide Shortness Of Breath    SOB SOB SOB   . Latex Rash    IV Location/Drains/Wounds Patient Lines/Drains/Airways Status   Active Line/Drains/Airways    Name:   Placement date:   Placement time:   Site:   Days:   Peripheral IV 01/16/18 Right Antecubital   01/16/18    -    Antecubital   138   Peripheral IV 01/16/18 Left;Anterior Forearm   01/16/18    1715    Forearm   138   Peripheral IV 06/02/18 Left Forearm   06/02/18    2223  Forearm   1   Peripheral IV 06/02/18 Left Antecubital   06/02/18    2301    Antecubital   1   External Urinary Catheter   11/26/17    1500    -   189   Wound / Incision (Open or Dehisced) 10/15/17 Diabetic ulcer Pretibial Right   10/15/17    0315    Pretibial   231   Wound / Incision (Open or Dehisced) 01/16/18 Diabetic ulcer Leg Right;Lower   01/16/18    1709    Leg   138          Labs/Imaging Results for orders placed or performed during the hospital encounter of 06/02/18 (from the past 48 hour(s))  Basic metabolic panel     Status: Abnormal   Collection Time: 06/02/18 10:33 PM  Result Value Ref Range   Sodium 133 (L) 135 - 145 mmol/L    Potassium 4.1 3.5 - 5.1 mmol/L    Comment: HEMOLYSIS AT THIS LEVEL MAY AFFECT RESULT   Chloride 96 (L) 98 - 111 mmol/L   CO2 24 22 - 32 mmol/L   Glucose, Bld 517 (HH) 70 - 99 mg/dL    Comment: CRITICAL RESULT CALLED TO, READ BACK BY AND VERIFIED WITH Marie Chow ON 06/03/18 AT 0004 BY JAG    BUN 22 8 - 23 mg/dL   Creatinine, Ser 1.33 (H) 0.61 - 1.24 mg/dL   Calcium 8.5 (L) 8.9 - 10.3 mg/dL   GFR calc non Af Amer 50 (L) >60 mL/min   GFR calc Af Amer 58 (L) >60 mL/min   Anion gap 13 5 - 15    Comment: Performed at [facility], [address] [place], [place] [address]  CBC     Status: Abnormal   Collection Time: 06/02/18 10:33 PM  Result Value Ref Range   WBC 12.1 (H) 4.0 - 10.5 K/uL   RBC 6.18 (H) 4.22 - 5.81 MIL/uL   Hemoglobin 16.3 13.0 - 17.0 g/dL   HCT 50.4 39.0 - 52.0 %   MCV 81.6 80.0 - 100.0 fL   MCH 26.4 26.0 - 34.0 pg   MCHC 32.3 30.0 - 36.0 g/dL   RDW 16.1 (H) 11.5 - 15.5 %   Platelets 278 150 - 400 K/uL   nRBC 0.0 0.0 - 0.2 %    Comment: Performed at [facility], [address] [place], [place] [address]  Troponin I - Once     Status: Abnormal   Collection Time: 06/02/18 10:33 PM  Result Value Ref Range   Troponin I 0.07 (HH) <0.03 ng/mL    Comment: CRITICAL RESULT CALLED TO, READ BACK BY AND VERIFIED WITH Jalise Zawistowski ON 06/03/18 AT 0004 BY JAG Performed at [facility], [address] [place],  [address]    Dg Chest Port 1 View  Result Date: 06/02/2018 CLINICAL DATA:  Substernal chest pain starting at 7:30. History of atrial fibrillation. Myocardial infarct 3 months ago. EXAM: PORTABLE CHEST 1 VIEW COMPARISON:  01/16/2018 FINDINGS: Mild cardiac enlargement. No vascular congestion, edema, or consolidation. No airspace disease in the lungs. No blunting of costophrenic angles no pneumothorax. Calcification of the aorta. IMPRESSION: Mild cardiac enlargement. No evidence of active pulmonary disease. Electronically Signed    By: Lucienne Capers M.D.   On: 06/02/2018 23:33    Pending Labs [employer] (From admission, onward)    Start     Ordered   Signed and Held  Troponin I -  Now Then Q6H  Now then every 6 hours,   R     Signed and Held   Signed and Held  Basic metabolic panel  Tomorrow morning,   R     Signed and Held   Signed and Held  CBC  Tomorrow morning,   R     Signed and Held          Vitals/Pain Today's Vitals   06/02/18 2342 06/02/18 2344 06/03/18 0000 06/03/18 0030  BP: (!) 140/91  139/78 (!) 108/93  Pulse: (!) 51 (!) 114 97 89  Resp: 18   18  Temp:      TempSrc:      SpO2: 93% 93% 96% 98%  Weight:      Height:      PainSc:        Isolation Precautions No active isolations  Medications Medications  diltiazem (CARDIZEM) injection 10 mg (10 mg Intravenous Given 06/02/18 2321)  insulin aspart (novoLOG) injection 8 Units (8 Units Intravenous Given 06/03/18 0017)    Mobility

## 2018-06-03 NOTE — ED Provider Notes (Signed)
[name] Provider Note _________________________   First MD Initiated Contact with Patient 06/02/18 2318     (approximate)  I have reviewed the triage vital signs and the nursing notes.   HISTORY  Chief Complaint Chest Pain    HPI [name] is a 84 y.o. male with below list of chronic medical conditions including CAD congestive heart failure and diabetes presents to the emergency department with acute onset of central nonradiating chest pain which began tonight at 7:30 PM before arrival.  Patient denies any dyspnea no diaphoresis no nausea or vomiting.  Patient states that he took 3 and 24 mg of aspirin before arrival.  Patient was given a sublingual nitroglycerin spray at 10:00 by EMS with pain resolution.  Patient does admit to rapid heartbeat with palpitations at present.   Past Medical History:  Diagnosis Date  . Arthritis   . Atrial fibrillation ([name])   . Carcinoma of prostate ([name])   . CHF (congestive heart failure) ([name])   . Chronic kidney disease   . Diabetes mellitus without complication ([name])   . ED (erectile dysfunction)   . Frequent urination   . Hyperlipidemia   . Hypertension   . Moderate mitral insufficiency   . Peripheral vascular disease ([name])   . Pneumonia 04/2016  . Prostate cancer ([name])   . Sleep apnea    OSA--USE C-PAP  . Ulcer of left foot due to type 2 diabetes mellitus ([name])   . Urinary stress incontinence, male     Patient Active Problem List   Diagnosis Date Noted  . Chest pain 06/03/2018  . NSTEMI (non-ST elevated myocardial infarction) ([name]) 01/16/2018  . Acute CHF (congestive heart failure) ([name]) 11/23/2017  . Atherosclerosis of native arteries of the extremities with ulceration ([name]) 11/18/2017  . Atrial fibrillation with rapid ventricular response ([name]) 10/15/2017  . BKA stump complication ([name]) 73/22/0254  . 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  S/P BKA (below knee amputation) unilateral, left ([facility]) 02/01/2017  . Chronic diastolic heart failure ([name]) 01/21/2017  . Pressure injury of skin 01/18/2017  . Atherosclerosis of artery of extremity with gangrene ([name]) 01/05/2017  . Diabetic foot ulcer ([name]) 12/29/2016  . Ataxia 10/21/2016  . History of femoral angiogram 09/06/2016  . Left leg pain 09/06/2016  . Leukocytosis 09/06/2016  . Generalized weakness 09/06/2016  . Atrial fibrillation, chronic 08/30/2016  . Pneumonia 05/19/2016  . Hyperlipidemia 04/20/2016  . Back pain 04/19/2016  . Type 2 diabetes mellitus treated with insulin ([name]) 04/19/2016  . Essential hypertension 04/19/2016  . PAD (peripheral artery disease) ([name]) 04/19/2016  . Erectile dysfunction following radical prostatectomy 06/25/2015  . History of prostate cancer 12/23/2014  . Incontinence 12/23/2014    Past Surgical History:  Procedure Laterality Date  . AMPUTATION Left 01/12/2017   Procedure: AMPUTATION BELOW KNEE;  Surgeon: Algernon Huxley, MD;  Location: [facility] ORS;  Service: General;  Laterality: Left;  . APLIGRAFT PLACEMENT Left 10/15/2016   Procedure: APLIGRAFT PLACEMENT;  Surgeon: Albertine Patricia, DPM;  Location: [facility] OR[facility];  Service: Podiatry;  Laterality: Left;  necrotic ulcer  . CHOLECYSTECTOMY    . EYE SURGERY Bilateral    Cataract Extraction with IOL  . IRRIGATION AND DEBRIDEMENT FOOT Left 10/15/2016   Procedure: IRRIGATION AND DEBRIDEMENT FOOT-EXCISIONAL DEBRIDEMENT OF SKIN 3RD, 4TH AND 5TH TOES WITH APPLICATION OF APLIGRAFT;  Surgeon: Albertine Patricia, DPM;  Location: [facility] OR[facility];  Service: Podiatry;  Laterality: Left;  necrotic,gangrene  .  IRRIGATION AND DEBRIDEMENT FOOT Left 12/09/2016   Procedure: IRRIGATION AND DEBRIDEMENT FOOT-MUSCLE/FASCIA, RESECTION OF FOURTH AND FIFTH METATARSAL NECROTIC BONE;  Surgeon: Albertine Patricia, DPM;  Location: [facility] OR[facility];  Service: Podiatry;  Laterality: Left;  . LOWER EXTREMITY ANGIOGRAPHY Left 08/16/2016   Procedure: Lower  Extremity Angiography;  Surgeon: Algernon Huxley, MD;  Location: [name];  Service: Cardiovascular;  Laterality: Left;  . LOWER EXTREMITY ANGIOGRAPHY Left 09/01/2016   Procedure: Lower Extremity Angiography;  Surgeon: Algernon Huxley, MD;  Location: [name];  Service: Cardiovascular;  Laterality: Left;  . LOWER EXTREMITY ANGIOGRAPHY Left 10/14/2016   Procedure: Lower Extremity Angiography;  Surgeon: Algernon Huxley, MD;  Location: [name];  Service: Cardiovascular;  Laterality: Left;  . LOWER EXTREMITY ANGIOGRAPHY Left 12/20/2016   Procedure: Lower Extremity Angiography;  Surgeon: Algernon Huxley, MD;  Location: [name];  Service: Cardiovascular;  Laterality: Left;  . LOWER EXTREMITY INTERVENTION  09/01/2016   Procedure: Lower Extremity Intervention;  Surgeon: Algernon Huxley, MD;  Location: [name];  Service: Cardiovascular;;  . PROSTATE SURGERY     removal  . TONSILLECTOMY     as a child    Prior to Admission medications   Medication Sig Start Date End Date Taking? Authorizing Provider  aspirin EC 81 MG tablet Take 81-324 mg by mouth as needed (chest pain).    Yes [provider]  atorvastatin (LIPITOR) 80 MG tablet Take 80 mg by mouth at bedtime.    Yes [provider]  empagliflozin (JARDIANCE) 25 MG TABS tablet Take 25 mg by mouth daily.   Yes [provider]  insulin glargine (LANTUS) 100 UNIT/ML injection Inject 0.1 mLs (10 Units total) into the skin at bedtime. Patient taking differently: Inject 15 Units into the skin at bedtime.  10/28/17  Yes Mody, Sital, MD  insulin lispro (HUMALOG) 100 UNIT/ML injection Inject 0.03 mLs (3 Units total) into the skin 3 (three) times daily with meals. Patient taking differently: Inject 3 Units into the skin 3 (three) times daily with meals. Plus 1 additional unit for every 50 BG points over 200 (max 5 additional units per dose) 11/28/17  Yes Wieting, Richard, MD  metoprolol tartrate  (LOPRESSOR) 25 MG tablet Take 1 tablet (25 mg total) by mouth 3 (three) times daily. 11/28/17  Yes Wieting, Richard, MD  rivaroxaban (XARELTO) 20 MG TABS tablet Take 1 tablet (20 mg total) by mouth daily with supper. 10/28/17  Yes Mody, Ulice Bold, MD  digoxin (LANOXIN) 0.125 MG tablet Take 0.125 mg by mouth daily.    [provider]  diltiazem (CARDIZEM) 120 MG tablet Take 120 mg by mouth daily.    [provider]  furosemide (LASIX) 20 MG tablet Take 1 tablet (20 mg total) by mouth daily. 01/18/18   [name], MD  HYDROcodone-acetaminophen (NORCO/VICODIN) 5-325 MG tablet 1-2 tabs po qd prn Patient not taking: Reported on 06/02/2018 02/14/18   Norval Gable, MD  isosorbide mononitrate (IMDUR) 30 MG 24 hr tablet Take 1 tablet (30 mg total) by mouth daily. 01/19/18   [name], MD  lisinopril (PRINIVIL,ZESTRIL) 2.5 MG tablet Take 1 tablet (2.5 mg total) by mouth daily. 11/28/17 11/28/18  Loletha Grayer, MD  polyethylene glycol (MIRALAX / Floria Raveling) packet Take 17 g by mouth daily. Patient not taking: Reported on 06/02/2018 11/28/17   Loletha Grayer, MD  potassium chloride SA (K-DUR,KLOR-CON) 20 MEQ tablet Take 20 mEq by mouth daily.  [provider]  risperiDONE (RISPERDAL) 0.5 MG tablet Take 0.5 mg by mouth at bedtime.    [provider]    Allergies Contrast media [iodinated diagnostic agents]; Iohexol; Metrizamide; and Latex  Family History  Problem Relation Age of Onset  . Hypertension Mother   . Diabetes Mother   . Prostate cancer Neg Hx   . Bladder Cancer Neg Hx   . Kidney disease Neg Hx     Social History Social History   Tobacco Use  . Smoking status: Former Smoker    Packs/day: 0.25    Types: Cigarettes    Last attempt to quit: 10/14/1994    Years since quitting: 23.6  . Smokeless tobacco: Never Used  Substance Use Topics  . Alcohol use: No    Alcohol/week: 0.0 standard drinks  . Drug use: No    Review of  Systems Constitutional: No fever/chills Eyes: No visual changes. ENT: No sore throat. Cardiovascular: Positive for chest pain.  Positive for palpitations Respiratory: Denies shortness of breath. Gastrointestinal: No abdominal pain.  No nausea, no vomiting.  No diarrhea.  No constipation. Genitourinary: Negative for dysuria. Musculoskeletal: Negative for neck pain.  Negative for back pain. Integumentary: Negative for rash. Neurological: Negative for headaches, focal weakness or numbness.   ____________________________________________   PHYSICAL EXAM:  VITAL SIGNS: ED Triage Vitals  Enc Vitals Group     BP 06/02/18 2227 (!) 149/102     Pulse Rate 06/02/18 2227 (!) 121     Resp 06/02/18 2227 12     Temp 06/02/18 2227 98 F (36.7 C)     Temp Source 06/02/18 2227 Oral     SpO2 06/02/18 2223 98 %     Weight 06/02/18 2229 81.6 kg (180 lb)     Height 06/02/18 2229 1.702 m (5\' 7" )     Head Circumference --      Peak Flow --      Pain Score 06/02/18 2228 3     Pain Loc --      Pain Edu? --      Excl. in [place]? --     Constitutional: Alert and oriented. Well appearing and in no acute distress. Eyes: Conjunctivae are normal.  Mouth/Throat: Mucous membranes are moist.  Oropharynx non-erythematous. Neck: No stridor.   Cardiovascular: Tachycardia, irregular regular rhythm good peripheral circulation. Grossly normal heart sounds. Respiratory: Normal respiratory effort.  No retractions. Lungs CTAB. Gastrointestinal: Soft and nontender. No distention.  Musculoskeletal: No lower extremity tenderness nor edema. No gross deformities of extremities. Neurologic:  Normal speech and language. No gross focal neurologic deficits are appreciated.  Skin:  Skin is warm, dry and intact. No rash noted. Psychiatric: Mood and affect are normal. Speech and behavior are normal.  ____________________________________________   LABS (all labs ordered are listed, but only abnormal results are  displayed)  Labs Reviewed  BASIC METABOLIC PANEL - Abnormal; Notable for the following components:      Result Value   Sodium 133 (*)    Chloride 96 (*)    Glucose, Bld 517 (*)    Creatinine, Ser 1.33 (*)    Calcium 8.5 (*)    GFR calc non Af Amer 50 (*)    GFR calc Af Amer 58 (*)    All other components within normal limits  CBC - Abnormal; Notable for the following components:   WBC 12.1 (*)    RBC 6.18 (*)    RDW 16.1 (*)    All other components within  normal limits  TROPONIN I - Abnormal; Notable for the following components:   Troponin I 0.07 (*)    All other components within normal limits   ____________________________________________  EKG  ED ECG REPORT I, [place] N BROWN, the attending physician, personally viewed and interpreted this ECG.   Date: 06/03/2018  EKG Time: 10:52 PM  Rate: 135  Rhythm: Atrial fibrillation with rapid ventricular response  Axis: Normal  Intervals:Normal  ST&T Change: None  ____________________________________________  RADIOLOGY I,  N BROWN, personally viewed and evaluated these images (plain radiographs) as part of my medical decision making, as well as reviewing the written report by the radiologist.  ED MD interpretation: Mild cardiac enlargement no evidence of active pulmonary disease on chest x-ray per radiologist.  Official radiology report(s): Dg Chest Port 1 View  Result Date: 06/02/2018 CLINICAL DATA:  Substernal chest pain starting at 7:30. History of atrial fibrillation. Myocardial infarct 3 months ago. EXAM: PORTABLE CHEST 1 VIEW COMPARISON:  01/16/2018 FINDINGS: Mild cardiac enlargement. No vascular congestion, edema, or consolidation. No airspace disease in the lungs. No blunting of costophrenic angles no pneumothorax. Calcification of the aorta. IMPRESSION: Mild cardiac enlargement. No evidence of active pulmonary disease. Electronically Signed   By: Lucienne Capers M.D.   On: 06/02/2018 23:33     Critical  Care performed:   .Critical Care Performed by: Gregor Hams, MD Authorized by: Gregor Hams, MD   Critical care provider statement:    Critical care time (minutes):  30   Critical care time was exclusive of:  Separately billable procedures and treating other patients   Critical care was necessary to treat or prevent imminent or life-threatening deterioration of the following conditions:  Circulatory failure and cardiac failure   Critical care was time spent personally by me on the following activities:  Development of treatment plan with patient or surrogate, discussions with consultants, evaluation of patient's response to treatment, examination of patient, obtaining history from patient or surrogate, ordering and performing treatments and interventions, ordering and review of laboratory studies, ordering and review of radiographic studies, pulse oximetry, re-evaluation of patient's condition and review of old charts     ____________________________________________   INITIAL IMPRESSION / ASSESSMENT AND PLAN / ED COURSE  As part of my medical decision making, I reviewed the following data within the electronic MEDICAL RECORD NUMBER   84 year old male presenting with above-stated history and physical exam secondary to chest pain and noted atrial fibrillation with rapid ventricular response.  Patient given diltiazem 10 mg IV with rate control at this time patient's heart rate while still in A. fib is rate controlled with rates not exceeding 90.  Patient's laboratory data notable for glucose of 517 with a normal anion gap which patient was given insulin 8 units IV.  In addition troponin positive at 0.07.  Patient discussed with Dr. Jannifer Franklin for hospital admission for further evaluation and management. ____________________________________________  FINAL CLINICAL IMPRESSION(S) / ED DIAGNOSES  Final diagnoses:  Atrial fibrillation with rapid ventricular response (HCC)  NSTEMI (non-ST  elevated myocardial infarction) ([place])  Hyperglycemia     MEDICATIONS GIVEN DURING THIS VISIT:  Medications  diltiazem (CARDIZEM) injection 10 mg (10 mg Intravenous Given 06/02/18 2321)  insulin aspart (novoLOG) injection 8 Units (8 Units Intravenous Given 06/03/18 0017)     ED Discharge Orders    None       Note:  This document was prepared using Dragon voice recognition software and may include unintentional dictation errors.  Gregor Hams, MD 06/03/18 [phone]

## 2018-06-03 NOTE — ED Notes (Signed)
Date and time results received: 06/03/18   Test: Glucose, Troponin Critical Value: 517, 0.07  Name of Provider Notified: Owens Shark MD  Orders Received? Or Actions Taken?: MD notified

## 2018-06-03 NOTE — Progress Notes (Signed)
Patient's scheduled dose of digoxin this morning was held due to heart rate in the low 50's. Currently now back up in the 60's. Dr. Darvin Neighbours notified of patients heart rate and decreased blood pressure of 102/73 after morning med's were given. Verbal orders ok to hold digoxin and MD made changes one of the medication in the [facility]. Will continue to monitor patient. Patient has no chest pain or any complaints. Heparin gtt also started and educated on medication.

## 2018-06-03 NOTE — Progress Notes (Signed)
[place] for heparin Indication: chest pain/ACS  Allergies  Allergen Reactions  . Contrast Media [Iodinated Diagnostic Agents] Shortness Of Breath    SOB  . Iohexol Shortness Of Breath     Desc: Respiratory Distress, Laryngedema, diaphoresis, Onset Date: [id number]   . Metrizamide Shortness Of Breath    SOB SOB SOB   . Latex Rash    Patient Measurements: Height: 5\' 7"  (170.2 cm) Weight: 176 lb 8 oz (80.1 kg) IBW/kg (Calculated) : 66.1 Heparin Dosing Weight: 81.6 kg  Vital Signs: Temp: 97.9 F (36.6 C) (01/11 1956) Temp Source: Oral (01/11 1956) BP: 102/75 (01/11 1956) Pulse Rate: 57 (01/11 1956)  Labs: Recent Labs    06/02/18 2233 06/03/18 0347 06/03/18 0927 06/03/18 1006 06/03/18 1500 06/03/18 1855  HGB 16.3 14.7  --   --   --   --   HCT 50.4 45.4  --   --   --   --   PLT 278 268  --   --   --   --   APTT  --   --   --  33  --  58*  LABPROT  --   --   --  18.7*  --   --   INR  --   --   --  1.58  --   --   HEPARINUNFRC  --   --   --  1.57*  --   --   CREATININE 1.33* 1.19  --   --   --   --   TROPONINI 0.07* 2.48* 2.76*  --  2.34*  --     Estimated Creatinine Clearance: 49.4 mL/min (by C-G formula based on SCr of 1.19 mg/dL).   Medical History: Past Medical History:  Diagnosis Date  . Arthritis   . Atrial fibrillation ([place])   . Carcinoma of prostate ([place])   . CHF (congestive heart failure) ([place])   . Chronic kidney disease   . Diabetes mellitus without complication ([place])   . ED (erectile dysfunction)   . Frequent urination   . Hyperlipidemia   . Hypertension   . Moderate mitral insufficiency   . Peripheral vascular disease ([place])   . Pneumonia 04/2016  . Prostate cancer ([place])   . Sleep apnea    OSA--USE C-PAP  . Ulcer of left foot due to type 2 diabetes mellitus ([place])   . Urinary stress incontinence, male     Assessment: 84 year old male with chest pain. Patient is on Xarelto 20 mg PTA. Per med rec,  patient reports last taking 1/10 at 2000.  HEPARIN COURSE Spoke with Dr. Ubaldo Glassing about starting heparin drip. Patient told him he thought he took last dose of Xarelto yesterday morning, but med rec has last dose at 2000 last night. With unclear time of last dose, we will start heparin drip now with no bolus per Dr. Ubaldo Glassing.  Will start heparin drip at 950 units/hr with NO bolus. APTT ordered for 1900. CBC and HL with morning labs.  Goal of Therapy:  aPTT 66-102 seconds Monitor platelets by anticoagulation protocol: Yes   Plan:  1/11 18:55 aPTT subtherapeutic. Increase to 1100 units/hr and recheck aPTT in 6 hours.  Laural Benes, PharmD Clinical Pharmacist 06/03/2018 8:50 PM

## 2018-06-03 NOTE — Progress Notes (Signed)
[place] at [place] NAME: [name]    MR#:  [id number]  DATE OF BIRTH:  1935/04/23  SUBJECTIVE:  CHIEF COMPLAINT:   Chief Complaint  Patient presents with  . Chest Pain   Chest pain at this time.  Heart rate continues to be elevated up to 110  REVIEW OF SYSTEMS:    Review of Systems  Constitutional: Positive for malaise/fatigue. Negative for chills and fever.  HENT: Negative for sore throat.   Eyes: Negative for blurred vision, double vision and pain.  Respiratory: Negative for cough, hemoptysis, shortness of breath and wheezing.   Cardiovascular: Negative for chest pain, palpitations, orthopnea and leg swelling.  Gastrointestinal: Negative for abdominal pain, constipation, diarrhea, heartburn, nausea and vomiting.  Genitourinary: Negative for dysuria and hematuria.  Musculoskeletal: Negative for back pain and joint pain.  Skin: Negative for rash.  Neurological: Negative for sensory change, speech change, focal weakness and headaches.  Endo/Heme/Allergies: Does not bruise/bleed easily.  Psychiatric/Behavioral: Negative for depression. The patient is not nervous/anxious.     DRUG ALLERGIES:   Allergies  Allergen Reactions  . Contrast Media [Iodinated Diagnostic Agents] Shortness Of Breath    SOB  . Iohexol Shortness Of Breath     Desc: Respiratory Distress, Laryngedema, diaphoresis, Onset Date: [id number]   . Metrizamide Shortness Of Breath    SOB SOB SOB   . Latex Rash    VITALS:  Blood pressure 102/73, pulse (!) 58, temperature 98 F (36.7 C), temperature source Oral, resp. rate 19, height 5\' 7"  (1.702 m), weight 80.1 kg, SpO2 98 %.  PHYSICAL EXAMINATION:   Physical Exam  GENERAL:  84 y.o.-year-old patient lying in the bed with no acute distress.  EYES: Pupils equal, round, reactive to light and accommodation. No scleral icterus. Extraocular muscles intact.  HEENT: Head atraumatic, normocephalic. Oropharynx and  nasopharynx clear.  NECK:  Supple, no jugular venous distention. No thyroid enlargement, no tenderness.  LUNGS: Normal breath sounds bilaterally, no wheezing, rales, rhonchi. No use of accessory muscles of respiration.  CARDIOVASCULAR: Irregularly irregular.  Tachycardia ABDOMEN: Soft, nontender, nondistended. Bowel sounds present. No organomegaly or mass.  EXTREMITIES: No cyanosis, clubbing or edema b/l.    NEUROLOGIC: Cranial nerves II through XII are intact. No focal Motor or sensory deficits b/l.   PSYCHIATRIC: The patient is alert and oriented x 3.  SKIN: No obvious rash, lesion, or ulcer.   LABORATORY PANEL:   CBC Recent Labs  Lab 06/03/18 0347  WBC 10.6*  HGB 14.7  HCT 45.4  PLT 268   ------------------------------------------------------------------------------------------------------------------ Chemistries  Recent Labs  Lab 06/03/18 0347  NA 136  K 4.1  CL 103  CO2 23  GLUCOSE 330*  BUN 23  CREATININE 1.19  CALCIUM 8.6*   ------------------------------------------------------------------------------------------------------------------  Cardiac Enzymes Recent Labs  Lab 06/03/18 0927  TROPONINI 2.76*   ------------------------------------------------------------------------------------------------------------------  RADIOLOGY:  Dg Chest Port 1 View  Result Date: 06/02/2018 CLINICAL DATA:  Substernal chest pain starting at 7:30. History of atrial fibrillation. Myocardial infarct 3 months ago. EXAM: PORTABLE CHEST 1 VIEW COMPARISON:  01/16/2018 FINDINGS: Mild cardiac enlargement. No vascular congestion, edema, or consolidation. No airspace disease in the lungs. No blunting of costophrenic angles no pneumothorax. Calcification of the aorta. IMPRESSION: Mild cardiac enlargement. No evidence of active pulmonary disease. Electronically Signed   By: Lucienne Capers M.D.   On: 06/02/2018 23:33     ASSESSMENT AND PLAN:   *Non-ST elevation MI.  EKG showing  inferior lead  ST depression.  Patient will be on aspirin, heparin.  Beta-blocker.  Statin.  Check echocardiogram.  Discussed with Dr. Ubaldo Glassing.  Cardiac catheterization on Monday.  Telemetry monitoring.  *Atrial fibrillation with rapid ventricular rate.  Continue digoxin, Cardizem.  Metoprolol dose increased but heart rate in the 50s to 60s.  Decrease metoprolol back to home dose.  Xarelto held for cardiac catheterization on Monday.  On heparin at this time.  *Diabetes mellitus.  Uncontrolled with hyperglycemia.  Continue home medications, sliding scale insulin added.  Diabetic diet.  *Hypertension.  Continue medications  *Chronic diastolic CHF.  Repeat echo is pending.  No signs of fluid overload  All the records are reviewed and case discussed with Care Management/Social Workerr. Management plans discussed with the patient, family and they are in agreement.  CODE STATUS: Full code  DVT Prophylaxis: SCDs  TOTAL CARE TIME TAKING CARE OF THIS PATIENT: 40 minutes.   POSSIBLE D/C IN 2-3 DAYS, DEPENDING ON CLINICAL CONDITION.  Neita Carp M.D on 06/03/2018 at 10:52 AM  Between 7am to 6pm - Pager - [phone]  After 6pm go to [email or web address] - password [email or web address] [facility]  Office  [phone]  CC: Primary care physician; Patient, No Pcp Per  Note: This dictation was prepared with Dragon dictation along with smaller phrase technology. Any transcriptional errors that result from this process are unintentional.

## 2018-06-03 NOTE — Progress Notes (Signed)
Talked to Dr. Jannifer Franklin about patient's BP of 102/75, has scheduled Metoprolol 25 mg, order to hold. RN will continue to monitor.

## 2018-06-03 NOTE — Consult Note (Signed)
Cardiology Consultation Note    Patient ID: [name], MRN: [id number], DOB/AGE: 27-Mar-1935 83 y.o. Admit date: 06/02/2018   Date of Consult: 06/03/2018 Primary Physician: Patient, No Pcp Per Primary Cardiologist: Dr. Nehemiah Massed  Chief Complaint: chest pain Reason for Consultation: chest pain/afib with rvr Requesting MD: Dr. Darvin Neighbours  HPI: [name] is a 84 y.o. male with history of atrial fibrillation, congestive heart failure, hyperlipidemia, diabetes mellitus, who presented to the emergency room with acute chest pain.  EKG showed atrial fibrillation with ST depression in the inferior leads.  Patient is ruled in for non-ST elevation myocardial infarction with low troponin peaked thus far at 2.48.  Was 0.07 on presentation.  Had acute on chronic renal insufficiency on presentation with a serum creatinine 1.33 which is improved 0.19.  Patient has been scheduled for cardiac catheterization on several occasions but on both occasions this was canceled once due to acute congestive heart failure decompensation and wants apparently due to atrial fibrillation with rapid ventricular response.  Echocardiogram done and July 2019 during admission showed ejection fraction of 40%.  This was global hypokinesis and patient felt to have both systolic and diastolic heart failure.  Telemetry at present reveals atrial fibrillation with controlled ventricular response.  Patient is currently on digoxin 0.125 mg daily, Cardizem 120 mg daily, furosemide 20 mg daily, atorvastatin 80 mg daily, aspirin 81 mg daily, lisinopril 2.5 mg daily, and Lopressor 75 mg twice daily.  He is hemodynamically stable.  He has been on rivaroxaban for anticoagulation at 20 mg daily.  Past Medical History:  Diagnosis Date  . Arthritis   . Atrial fibrillation ([place])   . Carcinoma of prostate ([place])   . CHF (congestive heart failure) ([place])   . Chronic kidney disease   . Diabetes mellitus without complication ([place])   . ED (erectile  dysfunction)   . Frequent urination   . Hyperlipidemia   . Hypertension   . Moderate mitral insufficiency   . Peripheral vascular disease ([place])   . Pneumonia 04/2016  . Prostate cancer ([place])   . Sleep apnea    OSA--USE C-PAP  . Ulcer of left foot due to type 2 diabetes mellitus ([place])   . Urinary stress incontinence, male       Surgical History:  Past Surgical History:  Procedure Laterality Date  . AMPUTATION Left 01/12/2017   Procedure: AMPUTATION BELOW KNEE;  Surgeon: Algernon Huxley, MD;  Location: [facility];  Service: General;  Laterality: Left;  . APLIGRAFT PLACEMENT Left 10/15/2016   Procedure: APLIGRAFT PLACEMENT;  Surgeon: Albertine Patricia, DPM;  Location: [facility];  Service: Podiatry;  Laterality: Left;  necrotic ulcer  . CHOLECYSTECTOMY    . EYE SURGERY Bilateral    Cataract Extraction with IOL  . IRRIGATION AND DEBRIDEMENT FOOT Left 10/15/2016   Procedure: IRRIGATION AND DEBRIDEMENT FOOT-EXCISIONAL DEBRIDEMENT OF SKIN 3RD, 4TH AND 5TH TOES WITH APPLICATION OF APLIGRAFT;  Surgeon: Albertine Patricia, DPM;  Location: [facility];  Service: Podiatry;  Laterality: Left;  necrotic,gangrene  . IRRIGATION AND DEBRIDEMENT FOOT Left 12/09/2016   Procedure: IRRIGATION AND DEBRIDEMENT FOOT-MUSCLE/FASCIA, RESECTION OF FOURTH AND FIFTH METATARSAL NECROTIC BONE;  Surgeon: Albertine Patricia, DPM;  Location: [facility];  Service: Podiatry;  Laterality: Left;  . LOWER EXTREMITY ANGIOGRAPHY Left 08/16/2016   Procedure: Lower Extremity Angiography;  Surgeon: Algernon Huxley, MD;  Location: [place] [facility];  Service: Cardiovascular;  Laterality: Left;  . LOWER EXTREMITY ANGIOGRAPHY Left 09/01/2016   Procedure: Lower Extremity Angiography;  Surgeon: Algernon Huxley, MD;  Location: [place] [facility];  Service: Cardiovascular;  Laterality: Left;  . LOWER EXTREMITY ANGIOGRAPHY Left 10/14/2016   Procedure: Lower Extremity Angiography;  Surgeon: Algernon Huxley, MD;  Location: [place] [facility];  Service:  Cardiovascular;  Laterality: Left;  . LOWER EXTREMITY ANGIOGRAPHY Left 12/20/2016   Procedure: Lower Extremity Angiography;  Surgeon: Algernon Huxley, MD;  Location: [place] [facility];  Service: Cardiovascular;  Laterality: Left;  . LOWER EXTREMITY INTERVENTION  09/01/2016   Procedure: Lower Extremity Intervention;  Surgeon: Algernon Huxley, MD;  Location: [place] [facility];  Service: Cardiovascular;;  . PROSTATE SURGERY     removal  . TONSILLECTOMY     as a child     Home Meds: Prior to Admission medications   Medication Sig Start Date End Date Taking? Authorizing Provider  aspirin EC 81 MG tablet Take 81-324 mg by mouth as needed (chest pain).    Yes [provider]  atorvastatin (LIPITOR) 80 MG tablet Take 80 mg by mouth at bedtime.    Yes [provider]  empagliflozin (JARDIANCE) 25 MG TABS tablet Take 25 mg by mouth daily.   Yes [provider]  furosemide (LASIX) 20 MG tablet Take 1 tablet (20 mg total) by mouth daily. 01/18/18  Yes [name], MD  insulin glargine (LANTUS) 100 UNIT/ML injection Inject 0.1 mLs (10 Units total) into the skin at bedtime. Patient taking differently: Inject 15 Units into the skin at bedtime.  10/28/17  Yes Mody, Sital, MD  insulin lispro (HUMALOG) 100 UNIT/ML injection Inject 0.03 mLs (3 Units total) into the skin 3 (three) times daily with meals. Patient taking differently: Inject 3 Units into the skin 3 (three) times daily with meals. Plus 1 additional unit for every 50 BG points over 200 (max 5 additional units per dose) 11/28/17  Yes Wieting, Richard, MD  metoprolol tartrate (LOPRESSOR) 25 MG tablet Take 1 tablet (25 mg total) by mouth 3 (three) times daily. 11/28/17  Yes Wieting, Richard, MD  rivaroxaban (XARELTO) 20 MG TABS tablet Take 1 tablet (20 mg total) by mouth daily with supper. 10/28/17  Yes Mody, Ulice Bold, MD  digoxin (LANOXIN) 0.125 MG tablet Take 0.125 mg by mouth daily.    [provider]  diltiazem  (CARDIZEM) 120 MG tablet Take 120 mg by mouth daily.    [provider]  HYDROcodone-acetaminophen (NORCO/VICODIN) 5-325 MG tablet 1-2 tabs po qd prn Patient not taking: Reported on 06/03/2018 02/14/18   Norval Gable, MD  isosorbide mononitrate (IMDUR) 30 MG 24 hr tablet Take 1 tablet (30 mg total) by mouth daily. Patient not taking: Reported on 06/03/2018 01/19/18   [name], MD  lisinopril (PRINIVIL,ZESTRIL) 2.5 MG tablet Take 1 tablet (2.5 mg total) by mouth daily. Patient not taking: Reported on 06/03/2018 11/28/17 11/28/18  Loletha Grayer, MD  polyethylene glycol [facility]) packet Take 17 g by mouth daily. Patient not taking: Reported on 06/02/2018 11/28/17   Loletha Grayer, MD  potassium chloride SA (K-DUR,KLOR-CON) 20 MEQ tablet Take 20 mEq by mouth daily.    [provider]  risperiDONE (RISPERDAL) 0.5 MG tablet Take 0.5 mg by mouth at bedtime.    [provider]    Inpatient Medications:  . aspirin EC  81 mg Oral Daily  . atorvastatin  80 mg Oral QHS  . digoxin  0.125 mg Oral Daily  . diltiazem  120 mg Oral Daily  . furosemide  20 mg  Oral Daily  . insulin aspart  0-15 Units Subcutaneous TID AC & HS  . insulin glargine  15 Units Subcutaneous QHS  . isosorbide mononitrate  30 mg Oral Daily  . lisinopril  2.5 mg Oral Daily  . metoprolol tartrate  75 mg Oral BID  . risperiDONE  0.5 mg Oral QHS     Allergies:  Allergies  Allergen Reactions  . Contrast Media [Iodinated Diagnostic Agents] Shortness Of Breath    SOB  . Iohexol Shortness Of Breath     Desc: Respiratory Distress, Laryngedema, diaphoresis, Onset Date: [id number]   . Metrizamide Shortness Of Breath    SOB SOB SOB   . Latex Rash    Social History   Socioeconomic History  . Marital status: Married    Spouse name: Not on file  . Number of children: Not on file  . Years of education: Not on file  . Highest education level: Not on file  Occupational History  .  Occupation: retired  Scientific laboratory technician  . Financial resource strain: Not on file  . Food insecurity:    Worry: Not on file    Inability: Not on file  . Transportation needs:    Medical: Not on file    Non-medical: Not on file  Tobacco Use  . Smoking status: Former Smoker    Packs/day: 0.25    Types: Cigarettes    Last attempt to quit: 10/14/1994    Years since quitting: 23.6  . Smokeless tobacco: Never Used  Substance and Sexual Activity  . Alcohol use: No    Alcohol/week: 0.0 standard drinks  . Drug use: No  . Sexual activity: Not on file  Lifestyle  . Physical activity:    Days per week: Not on file    Minutes per session: Not on file  . Stress: Not on file  Relationships  . Social connections:    Talks on phone: Not on file    Gets together: Not on file    Attends religious service: Not on file    Active member of club or organization: Not on file    Attends meetings of clubs or organizations: Not on file    Relationship status: Not on file  . Intimate partner violence:    Fear of current or ex partner: Not on file    Emotionally abused: Not on file    Physically abused: Not on file    Forced sexual activity: Not on file  Other Topics Concern  . Not on file  Social History Narrative  . Not on file     Family History  Problem Relation Age of Onset  . Hypertension Mother   . Diabetes Mother   . Prostate cancer Neg Hx   . Bladder Cancer Neg Hx   . Kidney disease Neg Hx      Review of Systems: A 12-system review of systems was performed and is negative except as noted in the HPI.  Labs: Recent Labs    06/02/18 2233 06/03/18 0347  TROPONINI 0.07* 2.48*   Lab Results  Component Value Date   WBC 10.6 (H) 06/03/2018   HGB 14.7 06/03/2018   HCT 45.4 06/03/2018   MCV 82.5 06/03/2018   PLT 268 06/03/2018    Recent Labs  Lab 06/03/18 0347  NA 136  K 4.1  CL 103  CO2 23  BUN 23  CREATININE 1.19  CALCIUM 8.6*  GLUCOSE 330*   Lab Results  Component  Value Date  CHOL 122 10/25/2017   HDL 39 (L) 10/25/2017   LDLCALC 70 10/25/2017   TRIG 65 10/25/2017   No results found for: DDIMER  Radiology/Studies:  Dg Chest Port 1 View  Result Date: 06/02/2018 CLINICAL DATA:  Substernal chest pain starting at 7:30. History of atrial fibrillation. Myocardial infarct 3 months ago. EXAM: PORTABLE CHEST 1 VIEW COMPARISON:  01/16/2018 FINDINGS: Mild cardiac enlargement. No vascular congestion, edema, or consolidation. No airspace disease in the lungs. No blunting of costophrenic angles no pneumothorax. Calcification of the aorta. IMPRESSION: Mild cardiac enlargement. No evidence of active pulmonary disease. Electronically Signed   By: Lucienne Capers M.D.   On: 06/02/2018 23:33    Wt Readings from Last 3 Encounters:  06/03/18 80.1 kg  06/02/18 82.3 kg  04/28/18 79.8 kg    EKG: Atrial fibrillation with variable ventricular response with ST depression in the inferior leads.  Physical Exam:  Blood pressure (!) 145/84, pulse 82, temperature 98 F (36.7 C), temperature source Oral, resp. rate 19, height 5\' 7"  (1.702 m), weight 80.1 kg, SpO2 99 %. Body mass index is 27.64 kg/m. General: Well developed, well nourished, in no acute distress. Head: Normocephalic, atraumatic, sclera non-icteric, no xanthomas, nares are without discharge.  Neck: Negative for carotid bruits. JVD not elevated. Lungs: Clear bilaterally to auscultation without wheezes, rales, or rhonchi. Breathing is unlabored. Heart: Irregularly irregular with S1 S2. No murmurs, rubs, or gallops appreciated. Abdomen: Soft, non-tender, non-distended with normoactive bowel sounds. No hepatomegaly. No rebound/guarding. No obvious abdominal masses. Msk:  Strength and tone appear normal for age. Extremities: Status post right t AKA Neuro: Alert and oriented X 3. No facial asymmetry. No focal deficit. Moves all extremities spontaneously. Psych:  Responds to questions appropriately with a normal  affect.     Assessment and Plan  85 year old male with history of atrial fibrillation treated with rate and rhythm control and Xarelto for anticoagulation, history of diabetes, history of peripheral vascular disease status post right AKA, who was admitted with chest pain.  Noted to be in atrial fibrillation rapid ventricular response.  EKG showed ST depression in the inferior leads.  Is ruled in for non-ST elevation myocardial infarction.  He has not had a cardiac catheterization in the past.  Will discontinue Xarelto and remain off for 2 days prior to cardiac cath.  Will start heparin at ACS doses.  We will continue with aspirin, high intensity statin, long-acting nitrates.  Risk and benefits of catheter explained to the patient.  Plan to proceed after he has been off Xarelto for 2 days.  Signed, Teodoro Spray MD 06/03/2018, 9:13 AM Pager: [phone]

## 2018-06-03 NOTE — Progress Notes (Signed)
*  PRELIMINARY RESULTS* Echocardiogram 2D Echocardiogram has been performed.  [name] 06/03/2018, 11:29 AM

## 2018-06-04 LAB — GLUCOSE, CAPILLARY
Glucose-Capillary: 171 mg/dL — ABNORMAL HIGH (ref 70–99)
Glucose-Capillary: 167 mg/dL — ABNORMAL HIGH (ref 70–99)
Glucose-Capillary: 337 mg/dL — ABNORMAL HIGH (ref 70–99)
Glucose-Capillary: 146 mg/dL — ABNORMAL HIGH (ref 70–99)

## 2018-06-04 LAB — CBC
HEMATOCRIT: 46.4 % (ref 39.0–52.0)
HEMOGLOBIN: 14.7 g/dL (ref 13.0–17.0)
MCH: 26.3 pg (ref 26.0–34.0)
MCHC: 31.7 g/dL (ref 30.0–36.0)
MCV: 83 fL (ref 80.0–100.0)
Platelets: 262 10*3/uL (ref 150–400)
RBC: 5.59 MIL/uL (ref 4.22–5.81)
RDW: 15.8 % — ABNORMAL HIGH (ref 11.5–15.5)
WBC: 9 10*3/uL (ref 4.0–10.5)
nRBC: 0 % (ref 0.0–0.2)

## 2018-06-04 LAB — APTT: aPTT: 83 seconds — ABNORMAL HIGH (ref 24–36)

## 2018-06-04 LAB — HEPARIN LEVEL (UNFRACTIONATED): Heparin Unfractionated: 1.22 IU/mL — ABNORMAL HIGH (ref 0.30–0.70)

## 2018-06-04 MED ORDER — DILTIAZEM LOAD VIA INFUSION
10.0000 mg | Freq: Once | INTRAVENOUS | Status: DC
  Filled 2018-06-04: qty 10

## 2018-06-04 MED ORDER — AMIODARONE HCL 200 MG PO TABS
400.0000 mg | ORAL_TABLET | Freq: Two times a day (BID) | ORAL | Status: DC
  Administered 2018-06-04 – 2018-06-09 (×9): 400 mg via ORAL
  Filled 2018-06-04 (×10): qty 2

## 2018-06-04 MED ORDER — SODIUM CHLORIDE 0.9 % WEIGHT BASED INFUSION: 3.0000 mL/kg/h | INTRAVENOUS | Status: AC

## 2018-06-04 MED ORDER — ASPIRIN 81 MG PO CHEW
81.0000 mg | CHEWABLE_TABLET | ORAL | Status: AC
  Administered 2018-06-05: 81 mg via ORAL
  Filled 2018-06-04: qty 1

## 2018-06-04 MED ORDER — SODIUM CHLORIDE 0.9% FLUSH: 3.0000 mL | INTRAVENOUS | Status: DC | PRN

## 2018-06-04 MED ORDER — SODIUM CHLORIDE 0.9 % IV SOLN: 250.0000 mL | INTRAVENOUS | Status: DC | PRN

## 2018-06-04 MED ORDER — SODIUM CHLORIDE 0.9 % WEIGHT BASED INFUSION
1.0000 mL/kg/h | INTRAVENOUS | Status: DC
  Administered 2018-06-05: 1 mL/kg/h via INTRAVENOUS

## 2018-06-04 MED ORDER — DILTIAZEM HCL 25 MG/5ML IV SOLN: 10.0000 mg | Freq: Once | INTRAVENOUS | Status: DC

## 2018-06-04 MED ORDER — NITROGLYCERIN 0.4 MG SL SUBL
0.4000 mg | SUBLINGUAL_TABLET | SUBLINGUAL | Status: DC | PRN
  Administered 2018-06-04: 0.4 mg via SUBLINGUAL
  Filled 2018-06-04: qty 1

## 2018-06-04 MED ORDER — NITROGLYCERIN 2 % TD OINT
1.0000 [in_us] | TOPICAL_OINTMENT | Freq: Four times a day (QID) | TRANSDERMAL | Status: DC
  Administered 2018-06-04 – 2018-06-06 (×9): 1 [in_us] via TOPICAL
  Filled 2018-06-04 (×10): qty 1

## 2018-06-04 MED ORDER — SODIUM CHLORIDE 0.9% FLUSH
3.0000 mL | Freq: Two times a day (BID) | INTRAVENOUS | Status: DC
  Administered 2018-06-04 (×2): 3 mL via INTRAVENOUS

## 2018-06-04 NOTE — Plan of Care (Signed)
  Problem: Health Behavior/Discharge Planning: Goal: Ability to manage health-related needs will improve Outcome: Not Progressing Note:  Patient with central chest pain this AM. Given 1 sublingual Nitroglycerin tablet and an inch of Nitropaste. Patient's H.R. also returned to [facility] before administering a dose of IV Cardizem. Med cancelled. Consent for catheterization signed and orders released. Will continue to monitor pain status. Wenda Low [facility]

## 2018-06-04 NOTE — Progress Notes (Signed)
[place] for heparin Indication: chest pain/ACS  Allergies  Allergen Reactions  . Contrast Media [Iodinated Diagnostic Agents] Shortness Of Breath    SOB  . Iohexol Shortness Of Breath     Desc: Respiratory Distress, Laryngedema, diaphoresis, Onset Date: [id number]   . Metrizamide Shortness Of Breath    SOB SOB SOB   . Latex Rash    Patient Measurements: Height: 5\' 7"  (170.2 cm) Weight: 176 lb 1.6 oz (79.9 kg) IBW/kg (Calculated) : 66.1 Heparin Dosing Weight: 81.6 kg  Vital Signs: Temp: 98.5 F (36.9 C) (01/12 0304) Temp Source: Oral (01/11 1956) BP: 104/66 (01/12 0304) Pulse Rate: 70 (01/12 0304)  Labs: Recent Labs    06/02/18 2233 06/03/18 0347 06/03/18 0927 06/03/18 1006 06/03/18 1500 06/03/18 1855 06/04/18 0254  HGB 16.3 14.7  --   --   --   --  14.7  HCT 50.4 45.4  --   --   --   --  46.4  PLT 278 268  --   --   --   --  262  APTT  --   --   --  33  --  58* 83*  LABPROT  --   --   --  18.7*  --   --   --   INR  --   --   --  1.58  --   --   --   HEPARINUNFRC  --   --   --  1.57*  --   --   --   CREATININE 1.33* 1.19  --   --   --   --   --   TROPONINI 0.07* 2.48* 2.76*  --  2.34*  --   --     Estimated Creatinine Clearance: 49.3 mL/min (by C-G formula based on SCr of 1.19 mg/dL).   Medical History: Past Medical History:  Diagnosis Date  . Arthritis   . Atrial fibrillation ([place])   . Carcinoma of prostate ([place])   . CHF (congestive heart failure) ([place])   . Chronic kidney disease   . Diabetes mellitus without complication ([place])   . ED (erectile dysfunction)   . Frequent urination   . Hyperlipidemia   . Hypertension   . Moderate mitral insufficiency   . Peripheral vascular disease ([place])   . Pneumonia 04/2016  . Prostate cancer ([place])   . Sleep apnea    OSA--USE C-PAP  . Ulcer of left foot due to type 2 diabetes mellitus ([place])   . Urinary stress incontinence, male     Assessment: 84 year old male with  chest pain. Patient is on Xarelto 20 mg PTA. Per med rec, patient reports last taking 1/10 at 2000.  HEPARIN COURSE Spoke with Dr. Ubaldo Glassing about starting heparin drip. Patient told him he thought he took last dose of Xarelto yesterday morning, but med rec has last dose at 2000 last night. With unclear time of last dose, we will start heparin drip now with no bolus per Dr. Ubaldo Glassing.  Will start heparin drip at 950 units/hr with NO bolus. APTT ordered for 1900. CBC and HL with morning labs.  Goal of Therapy:  aPTT 66-102 seconds Monitor platelets by anticoagulation protocol: Yes   Plan:  1/11 18:55 aPTT subtherapeutic. Increase to 1100 units/hr and recheck aPTT in 6 hours.  1/12 aPTT 83. Continue current regimen. Recheck aPTT, CBC and heparin level with tomorrow AM labs.  Eloise Harman, PharmD Clinical Pharmacist 06/04/2018 4:13 AM

## 2018-06-04 NOTE — Progress Notes (Signed)
Notified MD of blood sugar of 146. Pt has 15 units of lantus ordered for the evening. Orders to hold evening lantus. Will continue to monitor and assess.

## 2018-06-04 NOTE — Progress Notes (Signed)
Patient Name: [name] Date of Encounter: 06/04/2018  Hospital Problem List     Principal Problem:   Chest pain Active Problems:   Type 2 diabetes mellitus treated with insulin (HCC)   Essential hypertension   PAD (peripheral artery disease) (HCC)   Hyperlipidemia   Chronic diastolic heart failure (HCC)   Atrial fibrillation with rapid ventricular response (HCC)   NSTEMI (non-ST elevated myocardial infarction) [facility])    Patient Profile     84 year old with history of diabetes, atrial fibrillation, admitted with atrial fibrillation rapid ventricular response.  Is ruled in for non-ST elevation myocardial infarction.  Currently is having some chest pain with rapid ventricular response.  EKG during the event shows recurrent ST depression in the inferior leads consistent with what was present on admission.  Heart rate is improved symptoms have improved after sublingual nitroglycerin and rate control.  Subjective   84 year old male with probable coronary disease, non-ST elevation myocardial infarction atrial fibrillation rapid ventricular response.  Awaiting Xarelto washout before left heart cath which is scheduled for tomorrow morning.  Inpatient Medications    . [START ON 06/05/2018] aspirin  81 mg Oral Pre-Cath  . aspirin EC  81 mg Oral Daily  . atorvastatin  80 mg Oral QHS  . digoxin  0.125 mg Oral Daily  . diltiazem  120 mg Oral Daily  . furosemide  20 mg Oral Daily  . insulin aspart  0-15 Units Subcutaneous TID AC & HS  . insulin glargine  15 Units Subcutaneous QHS  . metoprolol tartrate  25 mg Oral BID  . nitroGLYCERIN  1 inch Topical Q6H  . risperiDONE  0.5 mg Oral QHS  . sodium chloride flush  3 mL Intravenous Q12H    Vital Signs    Vitals:   06/04/18 0304 06/04/18 0751 06/04/18 0914 06/04/18 0915  BP: 104/66 113/65 (!) 200/129 (!) 206/150  Pulse: 70 73 94 (!) 114  Resp: (!) 22 19 19    Temp: 98.5 F (36.9 C) 98.2 F (36.8 C) 97.6 F (36.4 C)   TempSrc:   Oral Oral   SpO2: 97% 95% 99%   Weight: 79.9 kg     Height:        Intake/Output Summary (Last 24 hours) at 06/04/2018 1025 Last data filed at 06/04/2018 0904 Gross per 24 hour  Intake 167.31 ml  Output 1125 ml  Net -957.69 ml   Filed Weights   06/02/18 2229 06/03/18 0232 06/04/18 0304  Weight: 81.6 kg 80.1 kg 79.9 kg    Physical Exam    GEN: Well nourished, well developed, in no acute distress.  HEENT: normal.  Neck: Supple, no JVD, carotid bruits, or masses. Cardiac: RRR, no murmurs, rubs, or gallops. No clubbing, cyanosis, edema.  Radials/DP/PT 2+ and equal bilaterally.  Respiratory:  Respirations regular and unlabored, clear to auscultation bilaterally. GI: Soft, nontender, nondistended, BS + x 4. MS: no deformity or atrophy. Skin: warm and dry, no rash. Neuro:  Strength and sensation are intact. Psych: Normal affect.  Labs    CBC Recent Labs    06/03/18 0347 06/04/18 0254  WBC 10.6* 9.0  HGB 14.7 14.7  HCT 45.4 46.4  MCV 82.5 83.0  PLT 268 314   Basic Metabolic Panel Recent Labs    06/02/18 2233 06/03/18 0347  NA 133* 136  K 4.1 4.1  CL 96* 103  CO2 24 23  GLUCOSE 517* 330*  BUN 22 23  CREATININE 1.33* 1.19  CALCIUM 8.5* 8.6*  Liver Function Tests No results for input(s): AST, ALT, ALKPHOS, BILITOT, PROT, ALBUMIN in the last 72 hours. No results for input(s): LIPASE, AMYLASE in the last 72 hours. Cardiac Enzymes Recent Labs    06/03/18 0347 06/03/18 0927 06/03/18 1500  TROPONINI 2.48* 2.76* 2.34*   BNP No results for input(s): BNP in the last 72 hours. D-Dimer No results for input(s): DDIMER in the last 72 hours. Hemoglobin A1C No results for input(s): HGBA1C in the last 72 hours. Fasting Lipid Panel No results for input(s): CHOL, HDL, LDLCALC, TRIG, CHOLHDL, LDLDIRECT in the last 72 hours. Thyroid Function Tests No results for input(s): TSH, T4TOTAL, T3FREE, THYROIDAB in the last 72 hours.  Invalid input(s): FREET3  Telemetry     Atrial fibrillation with variable but occasionally rapid ventricular response  ECG    A. fib with rapid ventricular response.  ST depression in the inferior leads.  Radiology    Dg Chest Port 1 View  Result Date: 06/02/2018 CLINICAL DATA:  Substernal chest pain starting at 7:30. History of atrial fibrillation. Myocardial infarct 3 months ago. EXAM: PORTABLE CHEST 1 VIEW COMPARISON:  01/16/2018 FINDINGS: Mild cardiac enlargement. No vascular congestion, edema, or consolidation. No airspace disease in the lungs. No blunting of costophrenic angles no pneumothorax. Calcification of the aorta. IMPRESSION: Mild cardiac enlargement. No evidence of active pulmonary disease. Electronically Signed   By: Lucienne Capers M.D.   On: 06/02/2018 23:33    Assessment & Plan    84 year old male with non-ST elevation myocardial infarction.  Likely has coronary disease with exacerbation of ischemia with rapid A. fib.  Rate is currently under control.  Chest pain this morning with recurrent ST depression.  Currently pain-free.  I have placed topical nitrates.  Will attempt initially oral load of amiodarone and wean off Cardizem as heart rate tolerates due to cardiomyopathy.  We will continue with heparin.  Hold Xarelto until cardiac cath can be completed.  We will continue with metoprolol tartrate 25 twice daily and discontinue Cardizem after amiodarone load is started.  Further recommendation after cardiac cath is completed.  Signed, Javier Docker Mckennon Zwart MD 06/04/2018, 10:25 AM  Pager: [phone]

## 2018-06-04 NOTE — Progress Notes (Signed)
[facility] [place] [facility] [place] NAME: [name]    MR#:  [id number]  DATE OF BIRTH:  20-Jun-1934  SUBJECTIVE:  CHIEF COMPLAINT:   Chief Complaint  Patient presents with  . Chest Pain   Chest pain today morning.  Also tachycardia on telemetry with atrial fibrillation.  REVIEW OF SYSTEMS:    Review of Systems  Constitutional: Positive for malaise/fatigue. Negative for chills and fever.  HENT: Negative for sore throat.   Eyes: Negative for blurred vision, double vision and pain.  Respiratory: Negative for cough, hemoptysis, shortness of breath and wheezing.   Cardiovascular: Positive for chest pain. Negative for palpitations, orthopnea and leg swelling.  Gastrointestinal: Negative for abdominal pain, constipation, diarrhea, heartburn, nausea and vomiting.  Genitourinary: Negative for dysuria and hematuria.  Musculoskeletal: Negative for back pain and joint pain.  Skin: Negative for rash.  Neurological: Negative for sensory change, speech change, focal weakness and headaches.  Endo/Heme/Allergies: Does not bruise/bleed easily.  Psychiatric/Behavioral: Negative for depression. The patient is not nervous/anxious.     DRUG ALLERGIES:   Allergies  Allergen Reactions  . Contrast Media [Iodinated Diagnostic Agents] Shortness Of Breath    SOB  . Iohexol Shortness Of Breath     Desc: Respiratory Distress, Laryngedema, diaphoresis, Onset Date: [id number]   . Metrizamide Shortness Of Breath    SOB SOB SOB   . Latex Rash    VITALS:  Blood pressure (!) 206/150, pulse (!) 114, temperature 97.6 F (36.4 C), temperature source Oral, resp. rate 19, height 5\' 7"  (1.702 m), weight 79.9 kg, SpO2 99 %.  PHYSICAL EXAMINATION:   Physical Exam  GENERAL:  84 y.o.-year-old patient lying in the bed with no acute distress.  EYES: Pupils equal, round, reactive to light and accommodation. No scleral icterus. Extraocular muscles intact.  HEENT: Head  atraumatic, normocephalic. Oropharynx and nasopharynx clear.  NECK:  Supple, no jugular venous distention. No thyroid enlargement, no tenderness.  LUNGS: Normal breath sounds bilaterally, no wheezing, rales, rhonchi. No use of accessory muscles of respiration.  CARDIOVASCULAR: Irregularly irregular.  Tachycardia ABDOMEN: Soft, nontender, nondistended. Bowel sounds present. No organomegaly or mass.  EXTREMITIES: No cyanosis, clubbing or edema b/l.    NEUROLOGIC: Cranial nerves II through XII are intact. No focal Motor or sensory deficits b/l.   PSYCHIATRIC: The patient is alert and oriented x 3.  SKIN: No obvious rash, lesion, or ulcer.   LABORATORY PANEL:   CBC Recent Labs  Lab 06/04/18 0254  WBC 9.0  HGB 14.7  HCT 46.4  PLT 262   ------------------------------------------------------------------------------------------------------------------ Chemistries  Recent Labs  Lab 06/03/18 0347  NA 136  K 4.1  CL 103  CO2 23  GLUCOSE 330*  BUN 23  CREATININE 1.19  CALCIUM 8.6*   ------------------------------------------------------------------------------------------------------------------  Cardiac Enzymes Recent Labs  Lab 06/03/18 1500  TROPONINI 2.34*   ------------------------------------------------------------------------------------------------------------------  RADIOLOGY:  Dg Chest Port 1 View  Result Date: 06/02/2018 CLINICAL DATA:  Substernal chest pain starting at 7:30. History of atrial fibrillation. Myocardial infarct 3 months ago. EXAM: PORTABLE CHEST 1 VIEW COMPARISON:  01/16/2018 FINDINGS: Mild cardiac enlargement. No vascular congestion, edema, or consolidation. No airspace disease in the lungs. No blunting of costophrenic angles no pneumothorax. Calcification of the aorta. IMPRESSION: Mild cardiac enlargement. No evidence of active pulmonary disease. Electronically Signed   By: Lucienne Capers M.D.   On: 06/02/2018 23:33     ASSESSMENT AND PLAN:   *  Atrial fibrillation with rapid ventricular rate causing  chest pain today  continue digoxin,  Metoprolol  Discussed with Dr. Ubaldo Glassing.  Stat dose of IV Cardizem ordered. To be loaded with amiodarone.  Transition of Cardizem as heart rate tolerates.  *Non-ST elevation MI.  EKG showing inferior lead ST depression.  Patient will be on aspirin, heparin.  Beta-blocker.  Statin.  Check echocardiogram.  Discussed with Dr. Ubaldo Glassing.  Cardiac catheterization on Monday.  Telemetry monitoring.  *Diabetes mellitus.  Uncontrolled with hyperglycemia.  Continue home medications, sliding scale insulin added.  Diabetic diet.  *Hypertension.  Continue medications  *Chronic diastolic CHF.  Repeat echo is pending.  No signs of fluid overload  All the records are reviewed and case discussed with Care Management/Social Workerr. Management plans discussed with the patient, family and they are in agreement.  CODE STATUS: Full code  DVT Prophylaxis: SCDs  TOTAL CARE TIME TAKING CARE OF THIS PATIENT: 35 minutes.   POSSIBLE D/C IN 2-3 DAYS, DEPENDING ON CLINICAL CONDITION.  Neita Carp M.D on 06/04/2018 at 12:28 PM  Between 7am to 6pm - Pager - [phone]  After 6pm go to [email or web address] - password [email or web address] [facility]  Office  [phone]  CC: Primary care physician; Patient, No Pcp Per  Note: This dictation was prepared with Dragon dictation along with smaller phrase technology. Any transcriptional errors that result from this process are unintentional.

## 2018-06-05 ENCOUNTER — Inpatient Hospital Stay: Payer: Medicare Other

## 2018-06-05 ENCOUNTER — Encounter
Admission: EM | Disposition: A | Discharge status: Home / Self Care | Payer: Self-pay | Source: Home / Self Care | Attending: Internal Medicine

## 2018-06-05 ENCOUNTER — Encounter: Payer: Self-pay | Admitting: Internal Medicine

## 2018-06-05 DIAGNOSIS — R739 Hyperglycemia, unspecified: Secondary | ICD-10-CM

## 2018-06-05 DIAGNOSIS — I639 Cerebral infarction, unspecified: Secondary | ICD-10-CM

## 2018-06-05 DIAGNOSIS — R079 Chest pain, unspecified: Secondary | ICD-10-CM

## 2018-06-05 HISTORY — PX: LEFT HEART CATH AND CORONARY ANGIOGRAPHY: CATH118249

## 2018-06-05 LAB — CBC
HCT: 45.5 % (ref 39.0–52.0)
Hemoglobin: 14.6 g/dL (ref 13.0–17.0)
MCH: 26.2 pg (ref 26.0–34.0)
MCHC: 32.1 g/dL (ref 30.0–36.0)
MCV: 81.5 fL (ref 80.0–100.0)
Platelets: 221 10*3/uL (ref 150–400)
RBC: 5.58 MIL/uL (ref 4.22–5.81)
RDW: 15.6 % — ABNORMAL HIGH (ref 11.5–15.5)
WBC: 9.3 10*3/uL (ref 4.0–10.5)
nRBC: 0 % (ref 0.0–0.2)

## 2018-06-05 LAB — APTT: aPTT: 101 seconds — ABNORMAL HIGH (ref 24–36)

## 2018-06-05 LAB — GLUCOSE, CAPILLARY
Glucose-Capillary: 188 mg/dL — ABNORMAL HIGH (ref 70–99)
Glucose-Capillary: 174 mg/dL — ABNORMAL HIGH (ref 70–99)
Glucose-Capillary: 185 mg/dL — ABNORMAL HIGH (ref 70–99)
Glucose-Capillary: 152 mg/dL — ABNORMAL HIGH (ref 70–99)
Glucose-Capillary: 133 mg/dL — ABNORMAL HIGH (ref 70–99)

## 2018-06-05 LAB — HEPARIN LEVEL (UNFRACTIONATED): Heparin Unfractionated: 1.02 IU/mL — ABNORMAL HIGH (ref 0.30–0.70)

## 2018-06-05 LAB — HEMOGLOBIN A1C
Hgb A1c MFr Bld: 10.7 % — ABNORMAL HIGH (ref 4.8–5.6)
Mean Plasma Glucose: 260.39 mg/dL

## 2018-06-05 SURGERY — LEFT HEART CATH AND CORONARY ANGIOGRAPHY
Anesthesia: Moderate Sedation

## 2018-06-05 MED ORDER — SODIUM CHLORIDE 0.9 % IV SOLN: 50.0000 mL | Freq: Once | INTRAVENOUS | Status: DC

## 2018-06-05 MED ORDER — HEPARIN SODIUM (PORCINE) 1000 UNIT/ML IJ SOLN
INTRAMUSCULAR | Status: DC | PRN
  Administered 2018-06-05: 4000 [IU] via INTRAVENOUS

## 2018-06-05 MED ORDER — DIPHENHYDRAMINE HCL 25 MG PO CAPS
50.0000 mg | ORAL_CAPSULE | Freq: Once | ORAL | Status: AC
  Administered 2018-06-05: 50 mg via ORAL

## 2018-06-05 MED ORDER — ONDANSETRON HCL 4 MG/2ML IJ SOLN: 4.0000 mg | Freq: Four times a day (QID) | INTRAMUSCULAR | Status: DC | PRN

## 2018-06-05 MED ORDER — HEPARIN SODIUM (PORCINE) 1000 UNIT/ML IJ SOLN
INTRAMUSCULAR | Status: AC
  Filled 2018-06-05: qty 1

## 2018-06-05 MED ORDER — ACETAMINOPHEN 325 MG PO TABS: 650.0000 mg | ORAL_TABLET | ORAL | Status: DC | PRN

## 2018-06-05 MED ORDER — VERAPAMIL HCL 2.5 MG/ML IV SOLN
INTRAVENOUS | Status: DC | PRN
  Administered 2018-06-05: 2.5 mg via INTRA_ARTERIAL

## 2018-06-05 MED ORDER — ALTEPLASE (STROKE) FULL DOSE INFUSION
0.9000 mg/kg | Freq: Once | INTRAVENOUS | Status: DC
  Filled 2018-06-05: qty 100

## 2018-06-05 MED ORDER — SODIUM CHLORIDE 0.9 % IV SOLN: 250.0000 mL | INTRAVENOUS | Status: DC | PRN

## 2018-06-05 MED ORDER — SODIUM CHLORIDE 0.9 % WEIGHT BASED INFUSION: 1.0000 mL/kg/h | INTRAVENOUS | Status: AC

## 2018-06-05 MED ORDER — RANOLAZINE ER 500 MG PO TB12
500.0000 mg | ORAL_TABLET | Freq: Two times a day (BID) | ORAL | Status: DC
  Administered 2018-06-06 – 2018-06-09 (×7): 500 mg via ORAL
  Filled 2018-06-05 (×10): qty 1

## 2018-06-05 MED ORDER — DIPHENHYDRAMINE HCL 50 MG/ML IJ SOLN: 50.0000 mg | Freq: Once | INTRAMUSCULAR | Status: AC

## 2018-06-05 MED ORDER — RIVAROXABAN 20 MG PO TABS
20.0000 mg | ORAL_TABLET | Freq: Every day | ORAL | Status: DC
  Filled 2018-06-05: qty 1

## 2018-06-05 MED ORDER — SODIUM CHLORIDE 0.9% FLUSH
3.0000 mL | Freq: Two times a day (BID) | INTRAVENOUS | Status: DC
  Administered 2018-06-05 – 2018-06-09 (×8): 3 mL via INTRAVENOUS

## 2018-06-05 MED ORDER — MORPHINE SULFATE (PF) 2 MG/ML IV SOLN
2.0000 mg | INTRAVENOUS | Status: DC | PRN
  Administered 2018-06-05 (×2): 2 mg via INTRAVENOUS
  Filled 2018-06-05 (×2): qty 1

## 2018-06-05 MED ORDER — HYDROCORTISONE NA SUCCINATE PF 250 MG IJ SOLR
200.0000 mg | Freq: Once | INTRAMUSCULAR | Status: AC
  Administered 2018-06-05: 200 mg via INTRAVENOUS
  Filled 2018-06-05 (×2): qty 200

## 2018-06-05 MED ORDER — DIPHENHYDRAMINE HCL 25 MG PO CAPS
ORAL_CAPSULE | ORAL | Status: AC
  Administered 2018-06-05: 50 mg via ORAL
  Filled 2018-06-05: qty 2

## 2018-06-05 MED ORDER — HEPARIN (PORCINE) IN NACL 1000-0.9 UT/500ML-% IV SOLN
INTRAVENOUS | Status: AC
  Filled 2018-06-05: qty 1000

## 2018-06-05 MED ORDER — IOPAMIDOL (ISOVUE-300) INJECTION 61%
INTRAVENOUS | Status: DC | PRN
  Administered 2018-06-05: 105 mL via INTRA_ARTERIAL

## 2018-06-05 MED ORDER — ASPIRIN 81 MG PO CHEW: 81.0000 mg | CHEWABLE_TABLET | Freq: Every day | ORAL | Status: DC

## 2018-06-05 MED ORDER — ALTEPLASE (STROKE) FULL DOSE INFUSION
0.9000 mg/kg | Freq: Once | INTRAVENOUS | Status: AC
  Administered 2018-06-05: 72.3 mg via INTRAVENOUS
  Filled 2018-06-05: qty 72.3

## 2018-06-05 MED ORDER — VERAPAMIL HCL 2.5 MG/ML IV SOLN
INTRAVENOUS | Status: AC
  Filled 2018-06-05: qty 2

## 2018-06-05 MED ORDER — SODIUM CHLORIDE 0.9% FLUSH
3.0000 mL | INTRAVENOUS | Status: DC | PRN
  Administered 2018-06-08: 3 mL via INTRAVENOUS
  Filled 2018-06-05: qty 3

## 2018-06-05 SURGICAL SUPPLY — 10 items
CATH INFINITI 5 FR JL3.5 (CATHETERS) ×2 IMPLANT
CATH INFINITI JR4 5F (CATHETERS) ×2 IMPLANT
DEVICE RAD TR BAND REGULAR (VASCULAR PRODUCTS) ×4 IMPLANT
GLIDESHEATH SLEND SS 6F .021 (SHEATH) ×2 IMPLANT
KIT MANI 3VAL PERCEP (MISCELLANEOUS) ×2 IMPLANT
PACK CARDIAC CATH (CUSTOM PROCEDURE TRAY) ×2 IMPLANT
PROTECTION STATION PRESSURIZED (MISCELLANEOUS) ×2
STATION PROTECTION PRESSURIZED (MISCELLANEOUS) ×1 IMPLANT
WIRE HITORQ VERSACORE ST 145CM (WIRE) ×2 IMPLANT
WIRE ROSEN-J .035X260CM (WIRE) ×2 IMPLANT

## 2018-06-05 NOTE — Progress Notes (Signed)
[place] at [place] NAME: [name]    MR#:  [id number]  DATE OF BIRTH:  1934-10-24  SUBJECTIVE:  CHIEF COMPLAINT:   Chief Complaint  Patient presents with  . Chest Pain   No chest pain Waiting for cath NPO  REVIEW OF SYSTEMS:    Review of Systems  Constitutional: Positive for malaise/fatigue. Negative for chills and fever.  HENT: Negative for sore throat.   Eyes: Negative for blurred vision, double vision and pain.  Respiratory: Negative for cough, hemoptysis, shortness of breath and wheezing.   Cardiovascular: Positive for chest pain. Negative for palpitations, orthopnea and leg swelling.  Gastrointestinal: Negative for abdominal pain, constipation, diarrhea, heartburn, nausea and vomiting.  Genitourinary: Negative for dysuria and hematuria.  Musculoskeletal: Negative for back pain and joint pain.  Skin: Negative for rash.  Neurological: Negative for sensory change, speech change, focal weakness and headaches.  Endo/Heme/Allergies: Does not bruise/bleed easily.  Psychiatric/Behavioral: Negative for depression. The patient is not nervous/anxious.     DRUG ALLERGIES:   Allergies  Allergen Reactions  . Contrast Media [Iodinated Diagnostic Agents] Shortness Of Breath    SOB  . Iohexol Shortness Of Breath     Desc: Respiratory Distress, Laryngedema, diaphoresis, Onset Date: [id number]   . Metrizamide Shortness Of Breath    SOB SOB SOB   . Latex Rash    VITALS:  Blood pressure 139/85, pulse 91, temperature 98 F (36.7 C), temperature source Oral, resp. rate 19, height 5\' 7"  (1.702 m), weight 80.3 kg, SpO2 94 %.  PHYSICAL EXAMINATION:   Physical Exam  GENERAL:  84 y.o.-year-old patient lying in the bed with no acute distress.  EYES: Pupils equal, round, reactive to light and accommodation. No scleral icterus. Extraocular muscles intact.  HEENT: Head atraumatic, normocephalic. Oropharynx and nasopharynx clear.   NECK:  Supple, no jugular venous distention. No thyroid enlargement, no tenderness.  LUNGS: Normal breath sounds bilaterally, no wheezing, rales, rhonchi. No use of accessory muscles of respiration.  CARDIOVASCULAR: Irregularly irregular.  Tachycardia ABDOMEN: Soft, nontender, nondistended. Bowel sounds present. No organomegaly or mass.  EXTREMITIES: No cyanosis, clubbing or edema b/l.    NEUROLOGIC: Cranial nerves II through XII are intact. No focal Motor or sensory deficits b/l.   PSYCHIATRIC: The patient is alert and oriented x 3.  SKIN: No obvious rash, lesion, or ulcer.   LABORATORY PANEL:   CBC Recent Labs  Lab 06/05/18 0554  WBC 9.3  HGB 14.6  HCT 45.5  PLT 221   ------------------------------------------------------------------------------------------------------------------ Chemistries  Recent Labs  Lab 06/03/18 0347  NA 136  K 4.1  CL 103  CO2 23  GLUCOSE 330*  BUN 23  CREATININE 1.19  CALCIUM 8.6*   ------------------------------------------------------------------------------------------------------------------  Cardiac Enzymes Recent Labs  Lab 06/03/18 1500  TROPONINI 2.34*   ------------------------------------------------------------------------------------------------------------------  RADIOLOGY:  Ct Head Code Stroke Wo Contrast`  Result Date: 06/05/2018 CLINICAL DATA:  Code stroke. Aphasia. Confusion. Last seen normal today. EXAM: CT HEAD WITHOUT CONTRAST TECHNIQUE: Contiguous axial images were obtained from the base of the skull through the vertex without intravenous contrast. COMPARISON:  11/24/2017 FINDINGS: Brain: No sign of acute infarction, mass lesion, hemorrhage, hydrocephalus or extra-axial collection. Vascular contrast is present related to a previous procedure. Vascular: There is atherosclerotic calcification of the major vessels at the base of the brain. Vascular contrast is present following previous cardiac catheterization. This makes  it difficult/impossible to identify hyperdensity related to an embolus, which would be about the same  density as the vascular contrast present throughout. Skull: Negative Sinuses/Orbits: Clear/normal Other: None ASPECTS ([place] Stroke Program Early CT Score) - Ganglionic level infarction (caudate, lentiform nuclei, internal capsule, insula, M1-M3 cortex): 7 - Supraganglionic infarction (M4-M6 cortex): 3 Total score (0-10 with 10 being normal): 10 IMPRESSION: 1. No acute finding. Vascular contrast present related to a previous procedure. 2. ASPECTS is 10. 3. These results were called by telephone at the time of interpretation on 06/05/2018 at 1:10 pm to Dr. Paschal Dopp , who verbally acknowledged these results. Electronically Signed   By: Nelson Chimes M.D.   On: 06/05/2018 13:14     ASSESSMENT AND PLAN:   * Atrial fibrillation with rapid ventricular rate  Improved. Now HR 70s with Afib Heparin drip  *Non-ST elevation MI.  EKG showing inferior lead ST depression.  Patient will be on aspirin, heparin.  Beta-blocker.  Statin.  Cath later today  *Diabetes mellitus.  Uncontrolled with hyperglycemia.  Continue home medications, sliding scale insulin added.  Diabetic diet.  *Hypertension.  Continue medications  *Chronic systolic chf EF 45 %  All the records are reviewed and case discussed with Care Management/Social Workerr. Management plans discussed with the patient, family and they are in agreement.  CODE STATUS: Full code  DVT Prophylaxis: SCDs  TOTAL CARE TIME TAKING CARE OF THIS PATIENT: 35 minutes.   POSSIBLE D/C IN 1-2 DAYS, DEPENDING ON CLINICAL CONDITION.  [name] M.D on 06/05/2018 at 2:15 PM  Between 7am to 6pm - Pager - [phone]  After 6pm go to [email or web address] - password [email or web address] [facility]  Office  [phone]  CC: Primary care physician; Patient, No Pcp Per  Note: This dictation was prepared with Dragon dictation along with smaller phrase  technology. Any transcriptional errors that result from this process are unintentional.

## 2018-06-05 NOTE — Progress Notes (Signed)
Patient underwent left heart cath today via right radial approach.  This revealed what appears to be a chronically occluded LAD with minimal collaterals from the right.  Left circumflex had a 50% stenosis was a normal sized vessel.  Patient has a proximal 70 to 75% RCA which is a large vessel.  EF 35% with anteroapical akinesis.  Does not appear to be amenable to PCI.  Will attempt medical management however consideration for high risk PCI of the RCA could be considered.  Most important to control A. fib as ischemia appears to be secondary to rapid ventricular response.  We will continue with amiodarone load.  Will resume Xarelto and continue with long-acting nitrates, beta-blocker and high intensity statin.will add ranexa 500 bid

## 2018-06-05 NOTE — Consult Note (Addendum)
Referring Physician: Hillary Bow, MD    Chief Complaint: Altered mental status, right facial droop right sided weakness and aphasia  HPI: [name] is an 84 y.o. male with past medical history significant for atrial fibrillation with rapid ventricular response on Xarelto, prostrate cancer, CHF, chronic kidney disease, diabetes mellitus without complications, hyperlipidemia, hypertension, moderate mitral insufficiency, peripheral vascular disease, pneumonia, and obstructive sleep apnea initially presenting  to the ED on 06/02/2018 with chief complaints of chest pain.  Per ED reports patient took aspirin 325 mg prior to EMS arrival in addition to sublingual nitro en route.  In the ED, EKG during that event showed recurrent ST depression in the inferior leads consistent with presentation.  Initial labs elevated troponin of 0.07,WBC 12.1, sodium 133, blood glucose 517, creatinine 1.33, BUN 22, decreased kidney functions GFR 50, PT 18.7 INR 1.58.  Patient was admitted and evaluated by cardiologist who scheduled cardiac cath for 06/05/2018.  Patient underwent left heart cath today via right radial approach.  Results of cardiac cath revealed chronically occluded LAD with minimal collaterals from the right, circumflex stenosis of 50% with normal vessel size, 70 to 75% RCA.  EF 35%.  Patient was deemed not a candidate for PCI due to considerable high risk for intervention.  At around 1222 code stroke was initiated by primary nurse who reported that she was in the room assessing the cath site while  patient was on the phone ordering lunch.  Towards the end of the conversation patient was noted to be incoherent and not making sense on the phone.  Patient appeared confused and was staring blankly off space for few seconds and then became nonverbal.  He was unable to grip or respond to commands per nursing report.  On further evaluation patient was also noted to have right facial droop and slurred speech. There was  no report of associated cranial nerve deficit, seizure like activity, focal sensory deficit, vision disturbance, nausea or vomiting, complains of dizziness or headache, syncope or LOC or other focal neurologic deficit. Initial NIH stroke scale 14.  A stat CT head was obtained which did not show acute intracranial abnormality. Patient was therefore deemed a candidate for IV alteplase and transferred to the ICU for further monitoring and management.  Date last known well: Date: 06/05/2018 Time last known well: Time: 12:22 tPA Given: Yes  Past Medical History:  Diagnosis Date  . Arthritis   . Atrial fibrillation ([place])   . Carcinoma of prostate ([place])   . CHF (congestive heart failure) ([place])   . Chronic kidney disease   . Diabetes mellitus without complication ([place])   . ED (erectile dysfunction)   . Frequent urination   . Hyperlipidemia   . Hypertension   . Moderate mitral insufficiency   . Peripheral vascular disease ([place])   . Pneumonia 04/2016  . Prostate cancer ([place])   . Sleep apnea    OSA--USE C-PAP  . Ulcer of left foot due to type 2 diabetes mellitus ([place])   . Urinary stress incontinence, male     Past Surgical History:  Procedure Laterality Date  . AMPUTATION Left 01/12/2017   Procedure: AMPUTATION BELOW KNEE;  Surgeon: Algernon Huxley, MD;  Location: [facility] OR[facility];  Service: General;  Laterality: Left;  . APLIGRAFT PLACEMENT Left 10/15/2016   Procedure: APLIGRAFT PLACEMENT;  Surgeon: Albertine Patricia, DPM;  Location: [facility];  Service: Podiatry;  Laterality: Left;  necrotic ulcer  . CHOLECYSTECTOMY    . EYE SURGERY Bilateral  Cataract Extraction with IOL  . IRRIGATION AND DEBRIDEMENT FOOT Left 10/15/2016   Procedure: IRRIGATION AND DEBRIDEMENT FOOT-EXCISIONAL DEBRIDEMENT OF SKIN 3RD, 4TH AND 5TH TOES WITH APPLICATION OF APLIGRAFT;  Surgeon: Albertine Patricia, DPM;  Location: [facility];  Service: Podiatry;  Laterality: Left;  necrotic,gangrene  . IRRIGATION AND DEBRIDEMENT FOOT Left  12/09/2016   Procedure: IRRIGATION AND DEBRIDEMENT FOOT-MUSCLE/FASCIA, RESECTION OF FOURTH AND FIFTH METATARSAL NECROTIC BONE;  Surgeon: Albertine Patricia, DPM;  Location: [facility];  Service: Podiatry;  Laterality: Left;  . LOWER EXTREMITY ANGIOGRAPHY Left 08/16/2016   Procedure: Lower Extremity Angiography;  Surgeon: Algernon Huxley, MD;  Location: [place] [facility];  Service: Cardiovascular;  Laterality: Left;  . LOWER EXTREMITY ANGIOGRAPHY Left 09/01/2016   Procedure: Lower Extremity Angiography;  Surgeon: Algernon Huxley, MD;  Location: [place] [facility];  Service: Cardiovascular;  Laterality: Left;  . LOWER EXTREMITY ANGIOGRAPHY Left 10/14/2016   Procedure: Lower Extremity Angiography;  Surgeon: Algernon Huxley, MD;  Location: [place] [facility];  Service: Cardiovascular;  Laterality: Left;  . LOWER EXTREMITY ANGIOGRAPHY Left 12/20/2016   Procedure: Lower Extremity Angiography;  Surgeon: Algernon Huxley, MD;  Location: [place] [facility];  Service: Cardiovascular;  Laterality: Left;  . LOWER EXTREMITY INTERVENTION  09/01/2016   Procedure: Lower Extremity Intervention;  Surgeon: Algernon Huxley, MD;  Location: [place] [facility];  Service: Cardiovascular;;  . PROSTATE SURGERY     removal  . TONSILLECTOMY     as a child    Family History  Problem Relation Age of Onset  . Hypertension Mother   . Diabetes Mother   . Prostate cancer Neg Hx   . Bladder Cancer Neg Hx   . Kidney disease Neg Hx    Social History:  reports that he quit smoking about 23 years ago. His smoking use included cigarettes. He smoked 0.25 packs per day. He has never used smokeless tobacco. He reports that he does not drink alcohol or use drugs.  Allergies:  Allergies  Allergen Reactions  . Contrast Media [Iodinated Diagnostic Agents] Shortness Of Breath    SOB  . Iohexol Shortness Of Breath     Desc: Respiratory Distress, Laryngedema, diaphoresis, Onset Date: [id number]   . Metrizamide Shortness Of Breath    SOB SOB SOB    . Latex Rash    Medications:  I have reviewed the patient's current medications. Prior to Admission:  Medications Prior to Admission  Medication Sig Dispense Refill Last Dose  . aspirin EC 81 MG tablet Take 81-324 mg by mouth as needed (chest pain).    06/02/2018 at Unknown time  . atorvastatin (LIPITOR) 80 MG tablet Take 80 mg by mouth at bedtime.    06/02/2018 at Unknown time  . digoxin (LANOXIN) 0.125 MG tablet Take 0.125 mg by mouth daily.   06/02/2018 at Unknown time  . diltiazem (CARDIZEM CD) 120 MG 24 hr capsule Take 120 mg by mouth daily.   06/02/2018 at Unknown time  . empagliflozin (JARDIANCE) 25 MG TABS tablet Take 25 mg by mouth daily.   06/02/2018 at Unknown time  . furosemide (LASIX) 20 MG tablet Take 1 tablet (20 mg total) by mouth daily. 30 tablet 2 06/02/2018 at Unknown time  . insulin glargine (LANTUS) 100 UNIT/ML injection Inject 0.1 mLs (10 Units total) into the skin at bedtime. (Patient taking differently: Inject 15 Units into the skin at bedtime. ) 10 mL 11 06/02/2018 at 2000  . insulin lispro (HUMALOG) 100 UNIT/ML injection  Inject 0.03 mLs (3 Units total) into the skin 3 (three) times daily with meals. (Patient taking differently: Inject 3 Units into the skin 3 (three) times daily with meals. Plus 1 additional unit for every 50 BG points over 200 (max 5 additional units per dose)) 10 mL 0 06/02/2018 at Unknown time  . lisinopril (PRINIVIL,ZESTRIL) 2.5 MG tablet Take 1 tablet (2.5 mg total) by mouth daily. 30 tablet 0 06/02/2018 at Unknown time  . metoprolol tartrate (LOPRESSOR) 25 MG tablet Take 1 tablet (25 mg total) by mouth 3 (three) times daily. 90 tablet 0 06/02/2018 at 2000  . potassium chloride SA (K-DUR,KLOR-CON) 20 MEQ tablet Take 20 mEq by mouth daily.   06/02/2018 at 0800  . rivaroxaban (XARELTO) 20 MG TABS tablet Take 1 tablet (20 mg total) by mouth daily with supper. 30 tablet 0 06/02/2018 at 2000  . HYDROcodone-acetaminophen (NORCO/VICODIN) 5-325 MG tablet 1-2 tabs po  qd prn (Patient not taking: Reported on 06/03/2018) 6 tablet 0 Not Taking at Unknown time  . isosorbide mononitrate (IMDUR) 30 MG 24 hr tablet Take 1 tablet (30 mg total) by mouth daily. (Patient not taking: Reported on 06/03/2018) 30 tablet 2 Not Taking at Unknown time  . polyethylene glycol (MIRALAX / GLYCOLAX) packet Take 17 g by mouth daily. (Patient not taking: Reported on 06/03/2018) 30 each 0 Not Taking at Unknown time   Scheduled: . amiodarone  400 mg Oral BID  . aspirin EC  81 mg Oral Daily  . atorvastatin  80 mg Oral QHS  . digoxin  0.125 mg Oral Daily  . furosemide  20 mg Oral Daily  . insulin aspart  0-15 Units Subcutaneous TID AC & HS  . insulin glargine  15 Units Subcutaneous QHS  . metoprolol tartrate  25 mg Oral BID  . nitroGLYCERIN  1 inch Topical Q6H  . ranolazine  500 mg Oral BID  . risperiDONE  0.5 mg Oral QHS  . sodium chloride flush  3 mL Intravenous Q12H    ROS: Unable to obtain from patient due to altered mental status.  Physical Examination: Blood pressure 135/80, pulse 93, temperature (!) 97.4 F (36.3 C), temperature source Oral, resp. rate 19, height 5\' 7"  (1.702 m), weight 80.3 kg, SpO2 (!) 85 %.   HEENT-  Normocephalic, no lesions, without obvious abnormality.  Normal external eye and conjunctiva.  Normal TM's bilaterally.  Normal auditory canals and external ears. Normal external nose, mucus membranes and septum.  Normal pharynx. Cardiovascular- S1, S2 normal, pulses palpable throughout   Lungs- chest clear, no wheezing, rales, normal symmetric air entry Abdomen- soft, non-tender; bowel sounds normal; no masses,  no organomegaly Extremities- no edema Lymph-no adenopathy palpable Musculoskeletal-no joint tenderness, deformity or swelling. Lefts BKA Skin-warm and dry, no hyperpigmentation, vitiligo, or suspicious lesions  Neurological Exam   Mental Status: Alert but confused. Unable to assess orientation due to current mental status. Speech  incomprehensible evidence of fluent aphasia. Unable to follow commands Cranial Nerves: II: Discs flat bilaterally; Visual fields grossly normal, pupils equal, round, reactive to light and accommodation III,IV, VI: ptosis not present, extra-ocular motions intact bilaterally V,VII: smile asymmetric, right facial droop, facial light touch sensation intact VIII: hearing normal bilaterally IX,X: gag reflex present XI: bilateral shoulder shrug XII: midline tongue extension Motor: Right :  Upper extremity   5/5 Without pronator drift      Left: Upper extremity   5/5 without pronator drift Right:   Lower extremity   5/5  Left: LBKA Tone and bulk:normal tone throughout; no atrophy noted Sensory: Pinprick and light touch intact bilaterally Deep Tendon Reflexes: 2+ and symmetric throughout Plantars: Right: mute                              Left: mute Cerebellar: Unable Gait: not tested due to safety concerns  Data Reviewed  Laboratory Studies:  Basic Metabolic Panel: Recent Labs  Lab 06/02/18 2233 06/03/18 0347  NA 133* 136  K 4.1 4.1  CL 96* 103  CO2 24 23  GLUCOSE 517* 330*  BUN 22 23  CREATININE 1.33* 1.19  CALCIUM 8.5* 8.6*    Liver Function Tests: No results for input(s): AST, ALT, ALKPHOS, BILITOT, PROT, ALBUMIN in the last 168 hours. No results for input(s): LIPASE, AMYLASE in the last 168 hours. No results for input(s): AMMONIA in the last 168 hours.  CBC: Recent Labs  Lab 06/02/18 2233 06/03/18 0347 06/04/18 0254 06/05/18 0554  WBC 12.1* 10.6* 9.0 9.3  HGB 16.3 14.7 14.7 14.6  HCT 50.4 45.4 46.4 45.5  MCV 81.6 82.5 83.0 81.5  PLT 278 268 262 221    Cardiac Enzymes: Recent Labs  Lab 06/02/18 2233 06/03/18 0347 06/03/18 0927 06/03/18 1500  TROPONINI 0.07* 2.48* 2.76* 2.34*    BNP: Invalid input(s): POCBNP  CBG: Recent Labs  Lab 06/04/18 1613 06/04/18 2056 06/05/18 0728 06/05/18 1007 06/05/18 1232   GLUCAP 337* 146* 188* 174* 185*    Microbiology: Results for orders placed or performed during the hospital encounter of 06/02/18  MRSA PCR Screening     Status: None   Collection Time: 06/03/18  3:02 AM  Result Value Ref Range Status   MRSA by PCR NEGATIVE NEGATIVE Final    Comment:        The GeneXpert MRSA Assay (FDA approved for NASAL specimens only), is one component of a comprehensive MRSA colonization surveillance program. It is not intended to diagnose MRSA infection nor to guide or monitor treatment for MRSA infections. Performed at [facility], [address] [place], [place] [address]     Coagulation Studies: Recent Labs    06/03/18 1006  LABPROT 18.7*  INR 1.58    Urinalysis: No results for input(s): COLORURINE, LABSPEC, PHURINE, GLUCOSEU, HGBUR, BILIRUBINUR, KETONESUR, PROTEINUR, UROBILINOGEN, NITRITE, LEUKOCYTESUR in the last 168 hours.  Invalid input(s): APPERANCEUR  Lipid Panel:    Component Value Date/Time   CHOL 122 10/25/2017 0612   TRIG 65 10/25/2017 0612   HDL 39 (L) 10/25/2017 0612   CHOLHDL 3.1 10/25/2017 0612   VLDL 13 10/25/2017 0612   LDLCALC 70 10/25/2017 0612    HgbA1C:  Lab Results  Component Value Date   HGBA1C 6.7 (H) 01/16/2018    Urine Drug Screen:  No results found for: LABOPIA, COCAINSCRNUR, LABBENZ, AMPHETMU, THCU, LABBARB  Alcohol Level: No results for input(s): ETH in the last 168 hours.  Other results: EKG: unchanged from previous tracings, atrial fibrillation with rapid ventricular response. Vent. rate 129 BPM PR interval * ms QRS duration 78 ms QT/QTc 316/462 ms P-R-T axes * 38 226  Imaging: No results found.  Assessment: 84 y.o. male with past medical history significant for atrial fibrillation with rapid ventricular response on Xarelto, prostrate cancer, CHF, chronic kidney disease, diabetes mellitus without complications, E, hyperlipidemia, hypertension, moderate mitral insufficiency,  peripheral vascular disease, pneumonia, and obstructive sleep apnea presenting intitially to the ED on 06/02/2018 with chief complaints of chest  pain.  Found to have  NSTEMI status post cardiac cath today 06/05/2018 at 7:30 am.  Code stroke initiated at 1222 due to altered mental status, right facial droop, right-sided weakness and aphasia concerning for ischemic event. CT head reviewed and shows no acute intracranial abnormality.  Patient deemed a candidate for IV altelplase with a dose of 72.3 mg administered at 1325 after consent obtained from significant other.  Patient was on Xarelto for A. fib prior to this event with the last dose administered > 48 hours ago.  Stroke Risk Factors - atrial fibrillation, carotid stenosis, diabetes mellitus, family history, hyperlipidemia and hypertension  1. MRI, MRA of the brain without contrast 2.Transfer to the  ICU. 3. Vital signs/blood pressure with neuro checks/NIHSS  per tPA protocol for the first 24 hours after receiving IV  4. Allow permissive hypertension. If SBP > 180 or DBP > 105 on 2 consecutive checks, then administer PRN IV anti-hypertensive medication (Labetalol 10-20 mg IV over 1-2 minutes or start Nicardipine drip at 5 mg/hr and can titrate up every 2.5 mg/hr until parameters are achieved) and notify MD. 5. No anti-platelets or Lovenox for 24 hours s/p IV Alteplase per protocol. 6. Place SCDs for DVT prevention. 7. Will repeat head CT in 24 hours s/p IV Alteplase per protocol. 8. Obtain STAT head CT for any new acute headache or new neurological deficits 9.  HgbA1c, fasting lipid panel 10. PT consult, OT consult, Speech consult 11. Echocardiogram 12. Carotid dopplers 13. NPO until RN stroke swallow screen 14. Telemetry monitoring  This patient was staffed with Dr. Arnaldo Natal who personally evaluated patient, reviewed documentation and agreed with assessment and plan of care as above.  Rufina Falco, DNP, FNP-BC Board certified Nurse  Practitioner Neurology Department    06/05/2018, 1:15 PM

## 2018-06-05 NOTE — OR Nursing (Signed)
Wife called to inform her that pt is in special procedure for cath prep. She is on way in. She said son signs the patients paper work only in event pt is unable to do it. Pt is alert and oriented x 4.

## 2018-06-05 NOTE — Progress Notes (Addendum)
Code Stroke called for this patient. Patient alert and oriented x4 this am before taken to cath lab. After procedure, went into the room to assess cath site. Patient on the phone finishing up ordering his lunch. Towards the end, he was not making any sense- non coherent.Marland Kitchen Upon assessment patient very confused, started staring off into space- seconds later became non-verbal, could not make a grip, right facial droop. Rapid response called as well as code stroke. Patient transported to CT. Last well known 1222.   Report given to [facility]. ICU Nurse

## 2018-06-05 NOTE — Progress Notes (Addendum)
Name: [name] MRN: [id number] DOB: Oct 06, 1934     CONSULTATION DATE: 06/02/2018  REFERRING MD :  Otis Dials, MD  CHIEF COMPLAINT:  Chest pain, code stroke  STUDIES:  Troponin, EKG, Head CT w/o contrast   HISTORY OF PRESENT ILLNESS:  Patient is a 84 y.o. male with medical history significant for diabetes type 2 uncontrolled, CAD, A-fib, CKD, CHF, presents to ICU with stroke. Patient presented to ED 3 days ago with acute chest pain, wife reported given him a total of 4 dosages of baby aspirin prior to EMS arrival.  Patient was later admitted due to NSTEMI and was scheduled for cardiac catheterrization on Monday. Patient underwent left heart cath this morning without PCI placement and no complication. Nurse reported after patient finished ordering his lunch, he became very confused, became non-verbal, right facial droop, and right side weakness. Patient was last seen normal at 12:22pm. Patient then transferred to ICU after head CT.   PAST MEDICAL HISTORY :   has a past medical history of Arthritis, Atrial fibrillation ([place]), Carcinoma of prostate ([place]), CHF (congestive heart failure) ([place]), Chronic kidney disease, Diabetes mellitus without complication ([place]), ED (erectile dysfunction), Frequent urination, Hyperlipidemia, Hypertension, Moderate mitral insufficiency, Peripheral vascular disease ([place]), Pneumonia (04/2016), Prostate cancer [facility]), Sleep apnea, Ulcer of left foot due to type 2 diabetes mellitus ([place]), and Urinary stress incontinence, male.  has a past surgical history that includes Prostate surgery; Tonsillectomy; Eye surgery (Bilateral); Cholecystectomy; Lower Extremity Angiography (Left, 08/16/2016); Lower Extremity Angiography (Left, 09/01/2016); LOWER EXTREMITY INTERVENTION (09/01/2016); Irrigation and debridement foot (Left, 10/15/2016); Apligraft placement (Left, 10/15/2016); Lower Extremity Angiography (Left, 10/14/2016); Irrigation and debridement foot (Left, 12/09/2016);  Lower Extremity Angiography (Left, 12/20/2016); Amputation (Left, 01/12/2017); and LEFT HEART CATH AND CORONARY ANGIOGRAPHY (N/A, 06/05/2018). Prior to Admission medications   Medication Sig Start Date End Date Taking? Authorizing Provider  aspirin EC 81 MG tablet Take 81-324 mg by mouth as needed (chest pain).    Yes [provider]  atorvastatin (LIPITOR) 80 MG tablet Take 80 mg by mouth at bedtime.    Yes [provider]  digoxin (LANOXIN) 0.125 MG tablet Take 0.125 mg by mouth daily.   Yes [provider]  diltiazem (CARDIZEM CD) 120 MG 24 hr capsule Take 120 mg by mouth daily.   Yes [provider]  empagliflozin (JARDIANCE) 25 MG TABS tablet Take 25 mg by mouth daily.   Yes [provider]  furosemide (LASIX) 20 MG tablet Take 1 tablet (20 mg total) by mouth daily. 01/18/18  Yes [name], MD  insulin glargine (LANTUS) 100 UNIT/ML injection Inject 0.1 mLs (10 Units total) into the skin at bedtime. Patient taking differently: Inject 15 Units into the skin at bedtime.  10/28/17  Yes Mody, Sital, MD  insulin lispro (HUMALOG) 100 UNIT/ML injection Inject 0.03 mLs (3 Units total) into the skin 3 (three) times daily with meals. Patient taking differently: Inject 3 Units into the skin 3 (three) times daily with meals. Plus 1 additional unit for every 50 BG points over 200 (max 5 additional units per dose) 11/28/17  Yes Wieting, Richard, MD  lisinopril (PRINIVIL,ZESTRIL) 2.5 MG tablet Take 1 tablet (2.5 mg total) by mouth daily. 11/28/17 11/28/18 Yes Wieting, Richard, MD  metoprolol tartrate (LOPRESSOR) 25 MG tablet Take 1 tablet (25 mg total) by mouth 3 (three) times daily. 11/28/17  Yes Wieting, Richard, MD  potassium chloride SA (K-DUR,KLOR-CON) 20 MEQ tablet Take 20 mEq by mouth daily.   Yes [provider]  rivaroxaban (XARELTO) 20 MG TABS tablet Take 1 tablet (20 mg total) by mouth daily with supper. 10/28/17  Yes Bettey Costa, MD   HYDROcodone-acetaminophen (NORCO/VICODIN) 5-325 MG tablet 1-2 tabs po qd prn Patient not taking: Reported on 06/03/2018 02/14/18   Norval Gable, MD  isosorbide mononitrate (IMDUR) 30 MG 24 hr tablet Take 1 tablet (30 mg total) by mouth daily. Patient not taking: Reported on 06/03/2018 01/19/18   [name], MD  polyethylene glycol [facility] / Floria Raveling) packet Take 17 g by mouth daily. Patient not taking: Reported on 06/03/2018 11/28/17   Loletha Grayer, MD   Allergies  Allergen Reactions  . Contrast Media [Iodinated Diagnostic Agents] Shortness Of Breath    SOB  . Iohexol Shortness Of Breath     Desc: Respiratory Distress, Laryngedema, diaphoresis, Onset Date: 16109604   . Metrizamide Shortness Of Breath    SOB SOB SOB   . Latex Rash    FAMILY HISTORY:  family history includes Diabetes in his mother; Hypertension in his mother. SOCIAL HISTORY:  reports that he quit smoking about 23 years ago. His smoking use included cigarettes. He smoked 0.25 packs per day. He has never used smokeless tobacco. He reports that he does not drink alcohol or use drugs.  REVIEW OF SYSTEMS:   Review of Systems  HENT: Negative for hearing loss.   Eyes: Negative for blurred vision and double vision.  Respiratory: Negative for cough, shortness of breath and wheezing.   Cardiovascular: Negative for chest pain and palpitations.  Musculoskeletal: Negative for neck pain.  Neurological: Positive for loss of consciousness (No recollection between catherization and ICU) and weakness. Negative for dizziness, speech change and headaches.     VITAL SIGNS: Temp:  [97.4 F (36.3 C)-98.4 F (36.9 C)] 98 F (36.7 C) (01/13 1400) Pulse Rate:  [54-122] 100 (01/13 1515) Resp:  [17-26] 17 (01/13 1515) BP: (99-163)/(49-110) 119/73 (01/13 1515) SpO2:  [85 %-99 %] 91 % (01/13 1515) Weight:  [80.3 kg-81.3 kg] 80.3 kg (01/13 1300)  Physical Examination:  GENERAL: In no acute distress HEAD: Normocephalic,  atraumatic.  EYES: Pupils equal, round, reactive to light.  No scleral icterus.  MOUTH: Moist mucosal membrane. NECK: Supple. No JVD.  PULMONARY: Clear lung sound bilaterally CARDIOVASCULAR: +Irregular rate and rhythm. No murmurs, rubs, or gallops.  GASTROINTESTINAL: Soft, nontender, -distended. No masses. Positive bowel sounds. No hepatosplenomegaly.  MUSCULOSKELETAL: No swelling, clubbing, or edema. LT BKA NEUROLOGIC: alert, able to follow command, -facial droops, -slurred speech, normal grip strength bilaterally,  SKIN:intact,warm,dry  I personally reviewed lab work that was obtained in last 24 hrs. CXR Independently reviewed- Mile cardiac enlargement, no evidence of active pulmonary disease.   ASSESSMENT / PLAN:  Patient is 84 y.o. male present to the ICU with acute stroke s/p left heart cath with history of uncontrolled DM type 2 and CHF and CAD and afib with RVR   Acute Stroke -Head CT w/o contrast shows no acute finding -Patient is eligible for TPA, administered around 1:15PM -No slurred speech, facial droop, unilateral weakness, sensory loss.  -Neurological status improved, confirmed by wife MRI pending Follow up Neurology recs  Non-ST elevated MI -Not a candidate for PCI placement per cardiology -Continue with amiodarone, long-acting nitroglycerin, aspirin, Lipitor, metoprolol tartate per cardiology  Type 2 diabetes, uncontrolled with hyperglycemia -CBG under controlled with insulin, continue monitor CBG  Chronic systolic CHF -EF 54%  Afib with RVR -Continue with metoprolol  -Amiodarone infusion     Overall, prognosis is guarded.  Patient is at high risk for recurrent CVA     Patient/Family are satisfied with Plan of action and management. All questions answered  Corrin Parker, M.D.  [facility]  Medical Director [place] Director [facility] Cardio-Pulmonary Department

## 2018-06-05 NOTE — Progress Notes (Signed)
Patient transferred to ICU for code stroke.  Discussed with neurology.  Considering TPA.  Has been off Xarelto for 2 days.  Heparin stopped earlier in the morning prior to cath.

## 2018-06-05 NOTE — OR Nursing (Signed)
Telemetry nurse requested I come reasses pt 10 minutes after returned to room. When I had left pt earlier pt was on phone discussing his diet with dietary. He was able to tell me what the cath findings were and his frustration that he couldn't have his blockages repaired today by getting a stent. He had required reenforcement of information Dr Ubaldo Glassing provided him and his wife earlier but was alert and oriented.  Upon arrival to inpt room found pt aphasic, right facial droop and rhythmic rubbing motion of right hand which was new to patient. Inpt nurse called a code stroke.

## 2018-06-05 NOTE — Code Documentation (Addendum)
Inpatient on 2A, pt went from a heart cath via radial artery this AM, upon arrival back to 2A pt was trying to order lunch when he had sudden AMS and aphasia, code stroke activated, pt taken to CT and then ICU 12, Dr. Lorin Picket and Benjamine Mola NP at bedside, initial NIHSS 14, pt hx of left BKA,  Pt demonstrates facial droop, aphasia, lower leg weakness, tPA given in ICU at 1325, wife at bedside, report off to [facility], after saline was hung using tPA tubing pt began to bleed from radial cath access, pressure held, Dr. Mortimer Fries notified, cath lab called and cath lab tech came to bedside and placed TR band, pt complained of pain in his right hand, Dr. Ubaldo Glassing paged to assess site

## 2018-06-05 NOTE — Plan of Care (Signed)
  Problem: Pain Managment: Goal: General experience of comfort will improve Outcome: Progressing   

## 2018-06-05 NOTE — Progress Notes (Signed)
MRI results reviewed with [facility] radiology-there's evidence of intraventricular hemorrhage. I had conversation with [facility] neurosurgery Dr. Lacinda Axon, we will monitor patients neurological symptoms in the ICU.   Patient had received TPA for acute CVA complicated by acute IVH.  The main issue will be long term anticoagulation for afib will NO longer be a viable option.

## 2018-06-05 NOTE — Progress Notes (Signed)
[place] for heparin Indication: chest pain/ACS  Allergies  Allergen Reactions  . Contrast Media [Iodinated Diagnostic Agents] Shortness Of Breath    SOB  . Iohexol Shortness Of Breath     Desc: Respiratory Distress, Laryngedema, diaphoresis, Onset Date: [id number]   . Metrizamide Shortness Of Breath    SOB SOB SOB   . Latex Rash    Patient Measurements: Height: 5\' 7"  (170.2 cm) Weight: 179 lb 3.7 oz (81.3 kg) IBW/kg (Calculated) : 66.1 Heparin Dosing Weight: 81.6 kg  Vital Signs: Temp: 98.4 F (36.9 C) (01/13 0439) Temp Source: Oral (01/13 0439) BP: 118/82 (01/13 0439) Pulse Rate: 70 (01/13 0439)  Labs: Recent Labs    06/02/18 2233 06/03/18 0347 06/03/18 0927  06/03/18 1006 06/03/18 1500 06/03/18 1855 06/04/18 0254 06/05/18 0554  HGB 16.3 14.7  --   --   --   --   --  14.7 14.6  HCT 50.4 45.4  --   --   --   --   --  46.4 45.5  PLT 278 268  --   --   --   --   --  262 221  APTT  --   --   --    < > 33  --  58* 83* 101*  LABPROT  --   --   --   --  18.7*  --   --   --   --   INR  --   --   --   --  1.58  --   --   --   --   HEPARINUNFRC  --   --   --   --  1.57*  --   --  1.22* 1.02*  CREATININE 1.33* 1.19  --   --   --   --   --   --   --   TROPONINI 0.07* 2.48* 2.76*  --   --  2.34*  --   --   --    < > = values in this interval not displayed.    Estimated Creatinine Clearance: 49.7 mL/min (by C-G formula based on SCr of 1.19 mg/dL).   Medical History: Past Medical History:  Diagnosis Date  . Arthritis   . Atrial fibrillation ([place])   . Carcinoma of prostate ([place])   . CHF (congestive heart failure) ([place])   . Chronic kidney disease   . Diabetes mellitus without complication ([employer])   . ED (erectile dysfunction)   . Frequent urination   . Hyperlipidemia   . Hypertension   . Moderate mitral insufficiency   . Peripheral vascular disease ([place])   . Pneumonia 04/2016  . Prostate cancer ([place])   . Sleep apnea    OSA--USE C-PAP  . Ulcer of left foot due to type 2 diabetes mellitus ([place])   . Urinary stress incontinence, male     Assessment: 84 year old male with chest pain. Patient is on Xarelto 20 mg PTA. Per med rec, patient reports last taking 1/10 at 2000.  HEPARIN COURSE Spoke with Dr. Ubaldo Glassing about starting heparin drip. Patient told him he thought he took last dose of Xarelto yesterday morning, but med rec has last dose at 2000 last night. With unclear time of last dose, we will start heparin drip now with no bolus per Dr. Ubaldo Glassing.  Will start heparin drip at 950 units/hr with NO bolus. APTT ordered for 1900. CBC and HL with morning labs.  Goal  of Therapy:  aPTT 66-102 seconds Monitor platelets by anticoagulation protocol: Yes   Plan:  1/11 18:55 aPTT subtherapeutic. Increase to 1100 units/hr and recheck aPTT in 6 hours.  1/12 aPTT 83. Continue current regimen. Recheck aPTT, CBC and heparin level with tomorrow AM labs.  1/13 AM heparin level 1.02, aPTT 101. Continue current regimen. Recheck aPTT, heparin level, CBC with tomorrow AM labs.  Eloise Harman, PharmD Clinical Pharmacist 06/05/2018 7:01 AM

## 2018-06-06 ENCOUNTER — Inpatient Hospital Stay: Payer: Medicare Other

## 2018-06-06 DIAGNOSIS — I4891 Unspecified atrial fibrillation: Secondary | ICD-10-CM

## 2018-06-06 DIAGNOSIS — I615 Nontraumatic intracerebral hemorrhage, intraventricular: Secondary | ICD-10-CM

## 2018-06-06 DIAGNOSIS — I214 Non-ST elevation (NSTEMI) myocardial infarction: Principal | ICD-10-CM

## 2018-06-06 LAB — CBC
HCT: 45.3 % (ref 39.0–52.0)
Hemoglobin: 14.5 g/dL (ref 13.0–17.0)
MCH: 26.5 pg (ref 26.0–34.0)
MCHC: 32 g/dL (ref 30.0–36.0)
MCV: 82.8 fL (ref 80.0–100.0)
NRBC: 0 % (ref 0.0–0.2)
Platelets: 237 10*3/uL (ref 150–400)
RBC: 5.47 MIL/uL (ref 4.22–5.81)
RDW: 15.5 % (ref 11.5–15.5)
WBC: 11 10*3/uL — ABNORMAL HIGH (ref 4.0–10.5)

## 2018-06-06 LAB — GLUCOSE, CAPILLARY
Glucose-Capillary: 106 mg/dL — ABNORMAL HIGH (ref 70–99)
Glucose-Capillary: 250 mg/dL — ABNORMAL HIGH (ref 70–99)
Glucose-Capillary: 232 mg/dL — ABNORMAL HIGH (ref 70–99)
Glucose-Capillary: 165 mg/dL — ABNORMAL HIGH (ref 70–99)

## 2018-06-06 LAB — LIPID PANEL
CHOLESTEROL: 146 mg/dL (ref 0–200)
HDL: 30 mg/dL — ABNORMAL LOW (ref >40)
LDL Cholesterol: 95 mg/dL (ref 0–99)
Total CHOL/HDL Ratio: 4.9 RATIO
Triglycerides: 104 mg/dL (ref <150)
VLDL: 21 mg/dL (ref 0–40)

## 2018-06-06 NOTE — Progress Notes (Signed)
I was contacted about this patient and his recent stroke.  He was given TPA and post treatment imaging was concern for new blood products in the occipital horns on CT scan.  After review, there does appear to be layering of blood there, however I do not see any obvious hydrocephalus or hemorrhage elsewhere.  The patient has returned to his neurological baseline and I encouraged him to be watched for an additional 24 hours in the hospital.  He should remain off all other blood thinners for at least a week.  He is okay for DVT prophylaxis.  If he develops any new neurologic symptoms, a repeat CT scan of the head should be obtained to ensure no developing hydrocephalus or further hemorrhage.  No neurosurgical intervention recommended at this time, defer stroke management to neurology.

## 2018-06-06 NOTE — Progress Notes (Signed)
Patient Name: [name] Date of Encounter: 06/06/2018  Hospital Problem List     Principal Problem:   Chest pain Active Problems:   Type 2 diabetes mellitus treated with insulin (HCC)   Essential hypertension   PAD (peripheral artery disease) (HCC)   Hyperlipidemia   Chronic diastolic heart failure (HCC)   Atrial fibrillation with rapid ventricular response (HCC)   NSTEMI (non-ST elevated myocardial infarction) [facility])    Patient Profile     84 year old male with history of chronic A. fib which was treated with anticoagulation with Xarelto admitted with A. fib with rapid ventricular response.  Ruled in for non-ST elevation myocardial infarction.  Had anginal symptoms when A. fib rate was rapid.  Cardiac cath done via right radial yesterday which revealed occluded LAD which appeared chronic.  50% stenosis in a small circumflex with a 80 to 90% PDA stenosis and some haziness in the proximal ostial RCA.  Given the occluded LAD and the risk of doing PCI of the large RCA, medical management was recommended.  Several hours post-cath patient developed right-sided weakness with dysphasia.  Stat brain CT revealed no acute bleed.  Patient was enrolled in code stroke and received alteplase.  Neurologic symptoms improved.  Scheduled brain MRI suggested intraventricular hemorrhage felt to be secondary to the alteplase.  Neuro status did not change.  Patient is currently hemodynamically stable.  Neurologically intact.  Will need to remain off of all anti-coagulation antiplatelet therapy for now until cleared neurologically.  Patient has a chads 2 vasc score of at least 6.  Subjective   Mild right hand discomfort secondary to edema.  Alert and oriented.  Nonfocal.  Inpatient Medications    . amiodarone  400 mg Oral BID  . atorvastatin  80 mg Oral QHS  . digoxin  0.125 mg Oral Daily  . furosemide  20 mg Oral Daily  . insulin aspart  0-15 Units Subcutaneous TID AC & HS  . insulin glargine  15  Units Subcutaneous QHS  . metoprolol tartrate  25 mg Oral BID  . nitroGLYCERIN  1 inch Topical Q6H  . ranolazine  500 mg Oral BID  . risperiDONE  0.5 mg Oral QHS  . sodium chloride flush  3 mL Intravenous Q12H    Vital Signs    Vitals:   06/06/18 0600 06/06/18 0700 06/06/18 0730 06/06/18 0800  BP: 137/80 138/87 123/76 (!) 107/58  Pulse: (!) 51 (!) 47 87   Resp: 17 15 16  (!) 21  Temp:   97.6 F (36.4 C) 97.8 F (36.6 C)  TempSrc:   Oral Oral  SpO2: 100% 98% 97%   Weight:      Height:        Intake/Output Summary (Last 24 hours) at 06/06/2018 0839 Last data filed at 06/06/2018 0511 Gross per 24 hour  Intake -  Output 625 ml  Net -625 ml   Filed Weights   06/05/18 0439 06/05/18 1300 06/06/18 0406  Weight: 81.3 kg 80.3 kg 80.3 kg    Physical Exam    GEN: Well nourished, well developed, in no acute distress.  HEENT: normal.  Neck: Supple, no JVD, carotid bruits, or masses. Cardiac: Irregular irregular rhythm.  Right radial cath site with ecchymosis. Respiratory:  Respirations regular and unlabored, clear to auscultation bilaterally. GI: Soft, nontender, nondistended, BS + x 4. MS: no deformity or atrophy. Skin: warm and dry, no rash. Neuro:  Strength and sensation are intact. Psych: Normal affect.  Labs  CBC Recent Labs    06/04/18 0254 06/05/18 0554  WBC 9.0 9.3  HGB 14.7 14.6  HCT 46.4 45.5  MCV 83.0 81.5  PLT 262 166   Basic Metabolic Panel No results for input(s): NA, K, CL, CO2, GLUCOSE, BUN, CREATININE, CALCIUM, MG, PHOS in the last 72 hours. Liver Function Tests No results for input(s): AST, ALT, ALKPHOS, BILITOT, PROT, ALBUMIN in the last 72 hours. No results for input(s): LIPASE, AMYLASE in the last 72 hours. Cardiac Enzymes Recent Labs    06/03/18 0927 06/03/18 1500  TROPONINI 2.76* 2.34*   BNP No results for input(s): BNP in the last 72 hours. D-Dimer No results for input(s): DDIMER in the last 72 hours. Hemoglobin A1C Recent Labs     06/05/18 0554  HGBA1C 10.7*   Fasting Lipid Panel No results for input(s): CHOL, HDL, LDLCALC, TRIG, CHOLHDL, LDLDIRECT in the last 72 hours. Thyroid Function Tests No results for input(s): TSH, T4TOTAL, T3FREE, THYROIDAB in the last 72 hours.  Invalid input(s): FREET3  Telemetry    Atrial fibrillation with controlled ventricular response  ECG    Atrial fibrillation  Radiology    Ct Head Wo Contrast  Result Date: 06/05/2018 CLINICAL DATA:  Status post tPA.  Follow-up of abnormal MRI. EXAM: CT HEAD WITHOUT CONTRAST TECHNIQUE: Contiguous axial images were obtained from the base of the skull through the vertex without intravenous contrast. COMPARISON:  Brain MRI 06/05/2018 FINDINGS: Brain: There is acute blood layering within both ventricles, right greater than left. The size and configuration of the ventricles is unchanged. There is no intraparenchymal hemorrhage. No midline shift or other mass effect. Vascular: There is mild bilateral carotid atherosclerotic calcification. Skull: Normal Sinuses/Orbits: Normal sinuses and orbits. Other: None IMPRESSION: Acute blood confirmed within the occipital horns of both lateral ventricles. No hydrocephalus, mass effect or intraparenchymal hemorrhage. Critical Value/emergent results were called by telephone at the time of interpretation on 06/05/2018 at 7:46 pm to Dr. Flora Lipps , who verbally acknowledged these results. Electronically Signed   By: Ulyses Jarred M.D.   On: 06/05/2018 19:47   Mr [name] Head Wo Contrast  Addendum Date: 06/05/2018   ADDENDUM REPORT: 06/05/2018 18:40 ADDENDUM: Critical Value/emergent results were called by telephone at the time of interpretation on 06/05/2018 at 6:27 pm to Dr. Mortimer Fries , who verbally acknowledged these results. Electronically Signed   By: Ulyses Jarred M.D.   On: 06/05/2018 18:40   Result Date: 06/05/2018 CLINICAL DATA:  Right facial droop and slurred speech. Status post tPA. EXAM: MRI HEAD WITHOUT CONTRAST  MRA HEAD WITHOUT CONTRAST TECHNIQUE: Multiplanar, multiecho pulse sequences of the brain and surrounding structures were obtained without intravenous contrast. Angiographic images of the head were obtained using MRA technique without contrast. COMPARISON:  Head CT 06/05/2018 FINDINGS: MRI HEAD FINDINGS BRAIN: There is no acute infarct, acute hemorrhage or mass effect. The midline structures are normal. There are fluid or debris levels within the occipital horns of both lateral ventricles, with low T1 and T2-weighted signal. The white matter signal is normal for the patient's age. Generalized atrophy without lobar predilection. Single chronic microhemorrhage in the left parietal lobe. SKULL AND UPPER CERVICAL SPINE: The visualized skull base, calvarium, upper cervical spine and extracranial soft tissues are normal. SINUSES/ORBITS: No fluid levels or advanced mucosal thickening. No mastoid or middle ear effusion. The orbits are normal. MRA HEAD FINDINGS POSTERIOR CIRCULATION: --Basilar artery: Normal. --Posterior cerebral arteries: Both are predominantly supplied by large caliber posterior communicating arteries. There is multifocal mild-to-moderate  narrowing of the right PCA. Normal left. --Superior cerebellar arteries: Normal. --Inferior cerebellar arteries: Normal anterior and posterior inferior cerebellar arteries. ANTERIOR CIRCULATION: --Intracranial internal carotid arteries: Normal. --Anterior cerebral arteries: Normal. Both A1 segments are present. Patent anterior communicating artery. --Middle cerebral arteries: Normal. --Posterior communicating arteries: Present bilaterally. IMPRESSION: 1. Fluid or debris levels within the occipital horns of both lateral ventricles, not visible on the earlier CT. This is concerning for acute intraventricular hemorrhage, particularly following tPA administration. Proteinaceous intraventricular debris could also cause this appearance. Repeat non-contrast head CT is recommended  for differentiation. 2. No intraparenchymal hemorrhage. 3. Mild-to-moderate multifocal narrowing of the right PCA. Otherwise normal intracranial MRA. 4. Mild chronic small vessel disease and atrophy. Electronically Signed: By: Ulyses Jarred M.D. On: 06/05/2018 18:20   Mr Brain Wo Contrast  Addendum Date: 06/05/2018   ADDENDUM REPORT: 06/05/2018 18:40 ADDENDUM: Critical Value/emergent results were called by telephone at the time of interpretation on 06/05/2018 at 6:27 pm to Dr. Mortimer Fries , who verbally acknowledged these results. Electronically Signed   By: Ulyses Jarred M.D.   On: 06/05/2018 18:40   Result Date: 06/05/2018 CLINICAL DATA:  Right facial droop and slurred speech. Status post tPA. EXAM: MRI HEAD WITHOUT CONTRAST MRA HEAD WITHOUT CONTRAST TECHNIQUE: Multiplanar, multiecho pulse sequences of the brain and surrounding structures were obtained without intravenous contrast. Angiographic images of the head were obtained using MRA technique without contrast. COMPARISON:  Head CT 06/05/2018 FINDINGS: MRI HEAD FINDINGS BRAIN: There is no acute infarct, acute hemorrhage or mass effect. The midline structures are normal. There are fluid or debris levels within the occipital horns of both lateral ventricles, with low T1 and T2-weighted signal. The white matter signal is normal for the patient's age. Generalized atrophy without lobar predilection. Single chronic microhemorrhage in the left parietal lobe. SKULL AND UPPER CERVICAL SPINE: The visualized skull base, calvarium, upper cervical spine and extracranial soft tissues are normal. SINUSES/ORBITS: No fluid levels or advanced mucosal thickening. No mastoid or middle ear effusion. The orbits are normal. MRA HEAD FINDINGS POSTERIOR CIRCULATION: --Basilar artery: Normal. --Posterior cerebral arteries: Both are predominantly supplied by large caliber posterior communicating arteries. There is multifocal mild-to-moderate narrowing of the right PCA. Normal left.  --Superior cerebellar arteries: Normal. --Inferior cerebellar arteries: Normal anterior and posterior inferior cerebellar arteries. ANTERIOR CIRCULATION: --Intracranial internal carotid arteries: Normal. --Anterior cerebral arteries: Normal. Both A1 segments are present. Patent anterior communicating artery. --Middle cerebral arteries: Normal. --Posterior communicating arteries: Present bilaterally. IMPRESSION: 1. Fluid or debris levels within the occipital horns of both lateral ventricles, not visible on the earlier CT. This is concerning for acute intraventricular hemorrhage, particularly following tPA administration. Proteinaceous intraventricular debris could also cause this appearance. Repeat non-contrast head CT is recommended for differentiation. 2. No intraparenchymal hemorrhage. 3. Mild-to-moderate multifocal narrowing of the right PCA. Otherwise normal intracranial MRA. 4. Mild chronic small vessel disease and atrophy. Electronically Signed: By: Ulyses Jarred M.D. On: 06/05/2018 18:20   Dg Chest Port 1 View  Result Date: 06/02/2018 CLINICAL DATA:  Substernal chest pain starting at 7:30. History of atrial fibrillation. Myocardial infarct 3 months ago. EXAM: PORTABLE CHEST 1 VIEW COMPARISON:  01/16/2018 FINDINGS: Mild cardiac enlargement. No vascular congestion, edema, or consolidation. No airspace disease in the lungs. No blunting of costophrenic angles no pneumothorax. Calcification of the aorta. IMPRESSION: Mild cardiac enlargement. No evidence of active pulmonary disease. Electronically Signed   By: Lucienne Capers M.D.   On: 06/02/2018 23:33   Ct Head Code Stroke Wo  Contrast`  Result Date: 06/05/2018 CLINICAL DATA:  Code stroke. Aphasia. Confusion. Last seen normal today. EXAM: CT HEAD WITHOUT CONTRAST TECHNIQUE: Contiguous axial images were obtained from the base of the skull through the vertex without intravenous contrast. COMPARISON:  11/24/2017 FINDINGS: Brain: No sign of acute infarction,  mass lesion, hemorrhage, hydrocephalus or extra-axial collection. Vascular contrast is present related to a previous procedure. Vascular: There is atherosclerotic calcification of the major vessels at the base of the brain. Vascular contrast is present following previous cardiac catheterization. This makes it difficult/impossible to identify hyperdensity related to an embolus, which would be about the same density as the vascular contrast present throughout. Skull: Negative Sinuses/Orbits: Clear/normal Other: None ASPECTS ([place] Stroke Program Early CT Score) - Ganglionic level infarction (caudate, lentiform nuclei, internal capsule, insula, M1-M3 cortex): 7 - Supraganglionic infarction (M4-M6 cortex): 3 Total score (0-10 with 10 being normal): 10 IMPRESSION: 1. No acute finding. Vascular contrast present related to a previous procedure. 2. ASPECTS is 10. 3. These results were called by telephone at the time of interpretation on 06/05/2018 at 1:10 pm to Dr. Paschal Dopp , who verbally acknowledged these results. Electronically Signed   By: Nelson Chimes M.D.   On: 06/05/2018 13:14    Assessment & Plan    84 year old male with history of coronary disease, chronic atrial fibrillation with TIA several hours post cath.  Etiology of this event is not completely clear.  Post cardiac cath certainly may be procedure although however somewhat less common 2 to 3 hours after the cardiac catheterization.  Patient had atrial fibrillation which is been chronic.  Was anticoagulated chronically with Xarelto.  This was discontinued on presentation the patient remained on heparin prior to his cardiac cath.  Heparin was discontinued at time a cath.  Appreciate neurology assistance.  Discussion regarding whether or not to resume antiplatelet and/or anticoagulation therapy will need to be discussed.  Patient's chads score is at least 6.  Coronary artery disease-medical management for now.  Chronically occluded LAD with moderate RCA  disease.  Certainly with current intracranial hemorrhage issue, percutaneous intervention would be contraindicated as dual antiplatelet therapy would need to be instilled.  We will continue with rate control with amiodarone and/or beta-blocker as needed.  We will continue with long-acting nitrates and Ranexa as tolerated.  Resume antiplatelet therapy when neurologically stable.  A. fib-as mentioned above.  Will attempt rate control and defer chronic anticoagulation for now.  Will elicit neurology guidance for reinstitution of long-term anticoagulation.  Intracranial hemorrhage appear to be secondary to TPA and it was not spontaneous.  Signed, Javier Docker Zniya Cottone MD 06/06/2018, 8:39 AM  Pager: [phone]

## 2018-06-06 NOTE — Plan of Care (Signed)
  Problem: Education: Goal: Knowledge of General Education information will improve Description Including pain rating scale, medication(s)/side effects and non-pharmacologic comfort measures Outcome: Progressing   Problem: Health Behavior/Discharge Planning: Goal: Ability to manage health-related needs will improve Outcome: Progressing   Problem: Clinical Measurements: Goal: Ability to maintain clinical measurements within normal limits will improve Outcome: Progressing Goal: Will remain free from infection Outcome: Progressing Goal: Diagnostic test results will improve Outcome: Progressing Goal: Respiratory complications will improve Outcome: Progressing Goal: Cardiovascular complication will be avoided Outcome: Progressing   Problem: Activity: Goal: Risk for activity intolerance will decrease Outcome: Progressing   Problem: Nutrition: Goal: Adequate nutrition will be maintained Outcome: Progressing   Problem: Elimination: Goal: Will not experience complications related to bowel motility Outcome: Progressing Goal: Will not experience complications related to urinary retention Outcome: Progressing   Problem: Pain Managment: Goal: General experience of comfort will improve Outcome: Progressing   Problem: Safety: Goal: Ability to remain free from injury will improve Outcome: Progressing   Problem: Skin Integrity: Goal: Risk for impaired skin integrity will decrease Outcome: Progressing   Problem: Education: Goal: Understanding of CV disease, CV risk reduction, and recovery process will improve Outcome: Progressing Goal: Individualized Educational Video(s) Outcome: Progressing   Problem: Cardiovascular: Goal: Ability to achieve and maintain adequate cardiovascular perfusion will improve Outcome: Progressing Goal: Vascular access site(s) Level 0-1 will be maintained Outcome: Progressing

## 2018-06-06 NOTE — Progress Notes (Signed)
[facility] NAME: [name]    MR#:  [id number]  DATE OF BIRTH:  1935-03-17  SUBJECTIVE:  CHIEF COMPLAINT:   Chief Complaint  Patient presents with  . Chest Pain   Some fatigue but no facial droop or dysphagia or dysarthria or focal weakness today  Had cardiac catheterization on 06/05/2018.  Right facial droop with dysarthria and right-sided weakness on 06/05/2018.  Code stroke called.  Transferred to ICU.  Status post TPA.  No chest pain.  Afebrile. REVIEW OF SYSTEMS:    Review of Systems  Constitutional: Positive for malaise/fatigue. Negative for chills and fever.  HENT: Negative for sore throat.   Eyes: Negative for blurred vision, double vision and pain.  Respiratory: Negative for cough, hemoptysis, shortness of breath and wheezing.   Cardiovascular: Positive for chest pain. Negative for palpitations, orthopnea and leg swelling.  Gastrointestinal: Negative for abdominal pain, constipation, diarrhea, heartburn, nausea and vomiting.  Genitourinary: Negative for dysuria and hematuria.  Musculoskeletal: Negative for back pain and joint pain.  Skin: Negative for rash.  Neurological: Negative for sensory change, speech change, focal weakness and headaches.  Endo/Heme/Allergies: Does not bruise/bleed easily.  Psychiatric/Behavioral: Negative for depression. The patient is not nervous/anxious.     DRUG ALLERGIES:   Allergies  Allergen Reactions  . Contrast Media [Iodinated Diagnostic Agents] Shortness Of Breath    SOB  . Iohexol Shortness Of Breath     Desc: Respiratory Distress, Laryngedema, diaphoresis, Onset Date: [id number]   . Metrizamide Shortness Of Breath    SOB SOB SOB   . Latex Rash    VITALS:  Blood pressure (!) 112/99, pulse (!) 36, temperature 97.8 F (36.6 C), temperature source Oral, resp. rate 17, height 5\' 7"  (1.702 m), weight 80.3 kg, SpO2 97 %.  PHYSICAL EXAMINATION:   Physical  Exam  GENERAL:  84 y.o.-year-old patient lying in the bed with no acute distress.  EYES: Pupils equal, round, reactive to light and accommodation. No scleral icterus. Extraocular muscles intact.  HEENT: Head atraumatic, normocephalic. Oropharynx and nasopharynx clear.  NECK:  Supple, no jugular venous distention. No thyroid enlargement, no tenderness.  LUNGS: Normal breath sounds bilaterally, no wheezing, rales, rhonchi. No use of accessory muscles of respiration.  CARDIOVASCULAR: Irregularly irregular.  Tachycardia ABDOMEN: Soft, nontender, nondistended. Bowel sounds present. No organomegaly or mass.  EXTREMITIES: No cyanosis, clubbing or edema b/l.    NEUROLOGIC: Cranial nerves II through XII are intact. No focal Motor or sensory deficits b/l.   PSYCHIATRIC: The patient is alert and oriented x 3.  SKIN: No obvious rash, lesion, or ulcer.   LABORATORY PANEL:   CBC Recent Labs  Lab 06/05/18 0554  WBC 9.3  HGB 14.6  HCT 45.5  PLT 221   ------------------------------------------------------------------------------------------------------------------ Chemistries  Recent Labs  Lab 06/03/18 0347  NA 136  K 4.1  CL 103  CO2 23  GLUCOSE 330*  BUN 23  CREATININE 1.19  CALCIUM 8.6*   ------------------------------------------------------------------------------------------------------------------  Cardiac Enzymes Recent Labs  Lab 06/03/18 1500  TROPONINI 2.34*   ------------------------------------------------------------------------------------------------------------------  RADIOLOGY:  Ct Head Wo Contrast  Result Date: 06/05/2018 CLINICAL DATA:  Status post tPA.  Follow-up of abnormal MRI. EXAM: CT HEAD WITHOUT CONTRAST TECHNIQUE: Contiguous axial images were obtained from the base of the skull through the vertex without intravenous contrast. COMPARISON:  Brain MRI 06/05/2018 FINDINGS: Brain: There is acute blood layering within both ventricles, right greater than left.  The size and configuration of the  ventricles is unchanged. There is no intraparenchymal hemorrhage. No midline shift or other mass effect. Vascular: There is mild bilateral carotid atherosclerotic calcification. Skull: Normal Sinuses/Orbits: Normal sinuses and orbits. Other: None IMPRESSION: Acute blood confirmed within the occipital horns of both lateral ventricles. No hydrocephalus, mass effect or intraparenchymal hemorrhage. Critical Value/emergent results were called by telephone at the time of interpretation on 06/05/2018 at 7:46 pm to Dr. Flora Lipps , who verbally acknowledged these results. Electronically Signed   By: Ulyses Jarred M.D.   On: 06/05/2018 19:47   Mr [name] Head Wo Contrast  Addendum Date: 06/05/2018   ADDENDUM REPORT: 06/05/2018 18:40 ADDENDUM: Critical Value/emergent results were called by telephone at the time of interpretation on 06/05/2018 at 6:27 pm to Dr. Mortimer Fries , who verbally acknowledged these results. Electronically Signed   By: Ulyses Jarred M.D.   On: 06/05/2018 18:40   Result Date: 06/05/2018 CLINICAL DATA:  Right facial droop and slurred speech. Status post tPA. EXAM: MRI HEAD WITHOUT CONTRAST MRA HEAD WITHOUT CONTRAST TECHNIQUE: Multiplanar, multiecho pulse sequences of the brain and surrounding structures were obtained without intravenous contrast. Angiographic images of the head were obtained using MRA technique without contrast. COMPARISON:  Head CT 06/05/2018 FINDINGS: MRI HEAD FINDINGS BRAIN: There is no acute infarct, acute hemorrhage or mass effect. The midline structures are normal. There are fluid or debris levels within the occipital horns of both lateral ventricles, with low T1 and T2-weighted signal. The white matter signal is normal for the patient's age. Generalized atrophy without lobar predilection. Single chronic microhemorrhage in the left parietal lobe. SKULL AND UPPER CERVICAL SPINE: The visualized skull base, calvarium, upper cervical spine and extracranial  soft tissues are normal. SINUSES/ORBITS: No fluid levels or advanced mucosal thickening. No mastoid or middle ear effusion. The orbits are normal. MRA HEAD FINDINGS POSTERIOR CIRCULATION: --Basilar artery: Normal. --Posterior cerebral arteries: Both are predominantly supplied by large caliber posterior communicating arteries. There is multifocal mild-to-moderate narrowing of the right PCA. Normal left. --Superior cerebellar arteries: Normal. --Inferior cerebellar arteries: Normal anterior and posterior inferior cerebellar arteries. ANTERIOR CIRCULATION: --Intracranial internal carotid arteries: Normal. --Anterior cerebral arteries: Normal. Both A1 segments are present. Patent anterior communicating artery. --Middle cerebral arteries: Normal. --Posterior communicating arteries: Present bilaterally. IMPRESSION: 1. Fluid or debris levels within the occipital horns of both lateral ventricles, not visible on the earlier CT. This is concerning for acute intraventricular hemorrhage, particularly following tPA administration. Proteinaceous intraventricular debris could also cause this appearance. Repeat non-contrast head CT is recommended for differentiation. 2. No intraparenchymal hemorrhage. 3. Mild-to-moderate multifocal narrowing of the right PCA. Otherwise normal intracranial MRA. 4. Mild chronic small vessel disease and atrophy. Electronically Signed: By: Ulyses Jarred M.D. On: 06/05/2018 18:20   Mr Brain Wo Contrast  Addendum Date: 06/05/2018   ADDENDUM REPORT: 06/05/2018 18:40 ADDENDUM: Critical Value/emergent results were called by telephone at the time of interpretation on 06/05/2018 at 6:27 pm to Dr. Mortimer Fries , who verbally acknowledged these results. Electronically Signed   By: Ulyses Jarred M.D.   On: 06/05/2018 18:40   Result Date: 06/05/2018 CLINICAL DATA:  Right facial droop and slurred speech. Status post tPA. EXAM: MRI HEAD WITHOUT CONTRAST MRA HEAD WITHOUT CONTRAST TECHNIQUE: Multiplanar, multiecho pulse  sequences of the brain and surrounding structures were obtained without intravenous contrast. Angiographic images of the head were obtained using MRA technique without contrast. COMPARISON:  Head CT 06/05/2018 FINDINGS: MRI HEAD FINDINGS BRAIN: There is no acute infarct, acute hemorrhage or mass effect. The midline  structures are normal. There are fluid or debris levels within the occipital horns of both lateral ventricles, with low T1 and T2-weighted signal. The white matter signal is normal for the patient's age. Generalized atrophy without lobar predilection. Single chronic microhemorrhage in the left parietal lobe. SKULL AND UPPER CERVICAL SPINE: The visualized skull base, calvarium, upper cervical spine and extracranial soft tissues are normal. SINUSES/ORBITS: No fluid levels or advanced mucosal thickening. No mastoid or middle ear effusion. The orbits are normal. MRA HEAD FINDINGS POSTERIOR CIRCULATION: --Basilar artery: Normal. --Posterior cerebral arteries: Both are predominantly supplied by large caliber posterior communicating arteries. There is multifocal mild-to-moderate narrowing of the right PCA. Normal left. --Superior cerebellar arteries: Normal. --Inferior cerebellar arteries: Normal anterior and posterior inferior cerebellar arteries. ANTERIOR CIRCULATION: --Intracranial internal carotid arteries: Normal. --Anterior cerebral arteries: Normal. Both A1 segments are present. Patent anterior communicating artery. --Middle cerebral arteries: Normal. --Posterior communicating arteries: Present bilaterally. IMPRESSION: 1. Fluid or debris levels within the occipital horns of both lateral ventricles, not visible on the earlier CT. This is concerning for acute intraventricular hemorrhage, particularly following tPA administration. Proteinaceous intraventricular debris could also cause this appearance. Repeat non-contrast head CT is recommended for differentiation. 2. No intraparenchymal hemorrhage. 3.  Mild-to-moderate multifocal narrowing of the right PCA. Otherwise normal intracranial MRA. 4. Mild chronic small vessel disease and atrophy. Electronically Signed: By: Ulyses Jarred M.D. On: 06/05/2018 18:20   Ct Head Code Stroke Wo Contrast`  Result Date: 06/05/2018 CLINICAL DATA:  Code stroke. Aphasia. Confusion. Last seen normal today. EXAM: CT HEAD WITHOUT CONTRAST TECHNIQUE: Contiguous axial images were obtained from the base of the skull through the vertex without intravenous contrast. COMPARISON:  11/24/2017 FINDINGS: Brain: No sign of acute infarction, mass lesion, hemorrhage, hydrocephalus or extra-axial collection. Vascular contrast is present related to a previous procedure. Vascular: There is atherosclerotic calcification of the major vessels at the base of the brain. Vascular contrast is present following previous cardiac catheterization. This makes it difficult/impossible to identify hyperdensity related to an embolus, which would be about the same density as the vascular contrast present throughout. Skull: Negative Sinuses/Orbits: Clear/normal Other: None ASPECTS ([place] Stroke Program Early CT Score) - Ganglionic level infarction (caudate, lentiform nuclei, internal capsule, insula, M1-M3 cortex): 7 - Supraganglionic infarction (M4-M6 cortex): 3 Total score (0-10 with 10 being normal): 10 IMPRESSION: 1. No acute finding. Vascular contrast present related to a previous procedure. 2. ASPECTS is 10. 3. These results were called by telephone at the time of interpretation on 06/05/2018 at 1:10 pm to Dr. Paschal Dopp , who verbally acknowledged these results. Electronically Signed   By: Nelson Chimes M.D.   On: 06/05/2018 13:14     ASSESSMENT AND PLAN:   *Acute CVA.  MRI negative.  But strokelike symptoms.  Status post TPA.  Complicated by intraventricular hemorrhage.  Repeat CT scan stable.  Follow-up CT scan pending at 1 PM today.  Discussed with neurology team.  Aspirin to be restarted if no  significant change in the intraventricular hemorrhage.  * Atrial fibrillation with rapid ventricular rate  Improved. Now HR 70s with Afib Heparin drip stopped with TPA  *Non-ST elevation MI.  EKG showing inferior lead ST depression.  Patient will be on  Beta-blocker.  Statin.  Multivessel disease with medical management advised.  No PCI. Aspirin held due to intraventricular hemorrhage status post thrombolysis with TPA  *Diabetes mellitus.  Uncontrolled with hyperglycemia.  Continue home medications, sliding scale insulin added.  Diabetic diet.  *Hypertension.  Continue medications  *  Chronic systolic chf EF 45 %  All the records are reviewed and case discussed with Care Management/Social Workerr. Management plans discussed with the patient, family and they are in agreement.  CODE STATUS: Full code  DVT Prophylaxis: SCDs  TOTAL CARE TIME TAKING CARE OF THIS PATIENT: 35 minutes.   POSSIBLE D/C IN 1-2 DAYS, DEPENDING ON CLINICAL CONDITION.  Neita Carp M.D on 06/06/2018 at 12:51 PM  Between 7am to 6pm - Pager - [phone]  After 6pm go to [email or web address] - password [email or web address] [facility]  Office  [phone]  CC: Primary care physician; Patient, No Pcp Per  Note: This dictation was prepared with Dragon dictation along with smaller phrase technology. Any transcriptional errors that result from this process are unintentional.

## 2018-06-06 NOTE — Progress Notes (Addendum)
Subjective: No new strokes or strokelike symptoms reported.  Patient symptoms continue to improve now almost back to baseline.  He is alert and oriented x4 this morning with NIH stroke scale of 0.  No signs of clinical deterioration, decreasing mental status, new headache, acute hypertension, nausea, or vomiting reported per nursing staff.  Pending repeat CT head.  Objective: Current vital signs: BP (!) 112/99   Pulse (!) 36   Temp 97.8 F (36.6 C) (Oral)   Resp 17   Ht 5\' 7"  (1.702 m)   Wt 80.3 kg   SpO2 97%   BMI 27.73 kg/m  Vital signs in last 24 hours: Temp:  [97.6 F (36.4 C)-98.9 F (37.2 C)] 97.8 F (36.6 C) (01/14 0800) Pulse Rate:  [36-136] 36 (01/14 1100) Resp:  [13-25] 17 (01/14 1100) BP: (91-163)/(58-99) 112/99 (01/14 1100) SpO2:  [85 %-100 %] 97 % (01/14 1100) Weight:  [80.3 kg] 80.3 kg (01/14 0406)  Intake/Output from previous day: 01/13 0701 - 01/14 0700 In: -  Out: 875 [Urine:875] Intake/Output this shift: Total I/O In: 240 [P.O.:240] Out: 300 [Urine:300] Nutritional status:  Diet Order            Diet Carb Modified Fluid consistency: Thin; Room service appropriate? Yes  Diet effective now             Physical Exam   Vitals Blood pressure (!) 112/99, pulse (!) 36, temperature 97.8 F (36.6 C), temperature source Oral, resp. rate 17, height 5\' 7"  (1.702 m), weight 80.3 kg, SpO2 97 %.    HEENT-  Normocephalic, no lesions, without obvious abnormality.  Normal external eye and conjunctiva.  Normal TM's bilaterally.  Normal auditory canals and external ears. Normal external nose, mucus membranes and septum.  Normal pharynx. Cardiovascular- S1, S2 normal, pulses palpable throughout   Lungs- chest clear, no wheezing, rales, normal symmetric air entry Abdomen- soft, non-tender; bowel sounds normal; no masses,  no organomegaly Extremities- no edema Lymph-no adenopathy palpable Musculoskeletal-no joint tenderness, deformity or swelling Skin-warm and dry,  no hyperpigmentation, vitiligo, or suspicious lesions  Neurological Exam   Mental Status: Alert, oriented x 4, thought content appropriate.  Speech fluent without evidence of aphasia.  Able to follow 3 step commands without difficulty. Attention span and concentration seemed appropriate  Cranial Nerves: II: Discs flat bilaterally; Visual fields grossly normal, pupils equal, round, reactive to light and accommodation III,IV, VI: ptosis not present, extra-ocular motions intact bilaterally V,VII: smile symmetric, facial light touch sensation intact VIII: hearing normal bilaterally IX,X: gag reflex present XI: bilateral shoulder shrug XII: midline tongue extension Motor: Right :  Upper extremity   5/5 Without pronator drift      Left: Upper extremity   5/5 without pronator drift Right:   Lower extremity   5/5                                          Left: BKA Tone and bulk:normal tone throughout; no atrophy noted Sensory: Pinprick and light touch intact bilaterally Deep Tendon Reflexes: 2+ and symmetric throughout Plantars: Right: mute                              Left: BKA Cerebellar: Finger-to-nose testing intact bilaterally. Heel to shin testing normal bilaterally Gait: not tested due to safety concerns  Lab Results: Basic Metabolic Panel:  Recent Labs  Lab 06/02/18 2233 06/03/18 0347  NA 133* 136  K 4.1 4.1  CL 96* 103  CO2 24 23  GLUCOSE 517* 330*  BUN 22 23  CREATININE 1.33* 1.19  CALCIUM 8.5* 8.6*    Liver Function Tests: No results for input(s): AST, ALT, ALKPHOS, BILITOT, PROT, ALBUMIN in the last 168 hours. No results for input(s): LIPASE, AMYLASE in the last 168 hours. No results for input(s): AMMONIA in the last 168 hours.  CBC: Recent Labs  Lab 06/02/18 2233 06/03/18 0347 06/04/18 0254 06/05/18 0554  WBC 12.1* 10.6* 9.0 9.3  HGB 16.3 14.7 14.7 14.6  HCT 50.4 45.4 46.4 45.5  MCV 81.6 82.5 83.0 81.5  PLT 278 268 262 221    Cardiac Enzymes: Recent  Labs  Lab 06/02/18 2233 06/03/18 0347 06/03/18 0927 06/03/18 1500  TROPONINI 0.07* 2.48* 2.76* 2.34*    Lipid Panel: No results for input(s): CHOL, TRIG, HDL, CHOLHDL, VLDL, LDLCALC in the last 168 hours.  CBG: Recent Labs  Lab 06/05/18 1232 06/05/18 1800 06/05/18 2114 06/06/18 0732 06/06/18 1137  GLUCAP 185* 152* 133* 106* 250*    Microbiology: Results for orders placed or performed during the hospital encounter of 06/02/18  MRSA PCR Screening     Status: None   Collection Time: 06/03/18  3:02 AM  Result Value Ref Range Status   MRSA by PCR NEGATIVE NEGATIVE Final    Comment:        The GeneXpert MRSA Assay (FDA approved for NASAL specimens only), is one component of a comprehensive MRSA colonization surveillance program. It is not intended to diagnose MRSA infection nor to guide or monitor treatment for MRSA infections. Performed at [facility], [address] [place], [place] [address]     Coagulation Studies: No results for input(s): LABPROT, INR in the last 72 hours.  Imaging: Ct Head Wo Contrast  Result Date: 06/05/2018 CLINICAL DATA:  Status post tPA.  Follow-up of abnormal MRI. EXAM: CT HEAD WITHOUT CONTRAST TECHNIQUE: Contiguous axial images were obtained from the base of the skull through the vertex without intravenous contrast. COMPARISON:  Brain MRI 06/05/2018 FINDINGS: Brain: There is acute blood layering within both ventricles, right greater than left. The size and configuration of the ventricles is unchanged. There is no intraparenchymal hemorrhage. No midline shift or other mass effect. Vascular: There is mild bilateral carotid atherosclerotic calcification. Skull: Normal Sinuses/Orbits: Normal sinuses and orbits. Other: None IMPRESSION: Acute blood confirmed within the occipital horns of both lateral ventricles. No hydrocephalus, mass effect or intraparenchymal hemorrhage. Critical Value/emergent results were called by telephone at the  time of interpretation on 06/05/2018 at 7:46 pm to Dr. Flora Lipps , who verbally acknowledged these results. Electronically Signed   By: Ulyses Jarred M.D.   On: 06/05/2018 19:47   Mr [name] Head Wo Contrast  Addendum Date: 06/05/2018   ADDENDUM REPORT: 06/05/2018 18:40 ADDENDUM: Critical Value/emergent results were called by telephone at the time of interpretation on 06/05/2018 at 6:27 pm to Dr. Mortimer Fries , who verbally acknowledged these results. Electronically Signed   By: Ulyses Jarred M.D.   On: 06/05/2018 18:40   Result Date: 06/05/2018 CLINICAL DATA:  Right facial droop and slurred speech. Status post tPA. EXAM: MRI HEAD WITHOUT CONTRAST MRA HEAD WITHOUT CONTRAST TECHNIQUE: Multiplanar, multiecho pulse sequences of the brain and surrounding structures were obtained without intravenous contrast. Angiographic images of the head were obtained using MRA technique without contrast. COMPARISON:  Head CT 06/05/2018 FINDINGS: MRI HEAD  FINDINGS BRAIN: There is no acute infarct, acute hemorrhage or mass effect. The midline structures are normal. There are fluid or debris levels within the occipital horns of both lateral ventricles, with low T1 and T2-weighted signal. The white matter signal is normal for the patient's age. Generalized atrophy without lobar predilection. Single chronic microhemorrhage in the left parietal lobe. SKULL AND UPPER CERVICAL SPINE: The visualized skull base, calvarium, upper cervical spine and extracranial soft tissues are normal. SINUSES/ORBITS: No fluid levels or advanced mucosal thickening. No mastoid or middle ear effusion. The orbits are normal. MRA HEAD FINDINGS POSTERIOR CIRCULATION: --Basilar artery: Normal. --Posterior cerebral arteries: Both are predominantly supplied by large caliber posterior communicating arteries. There is multifocal mild-to-moderate narrowing of the right PCA. Normal left. --Superior cerebellar arteries: Normal. --Inferior cerebellar arteries: Normal anterior and  posterior inferior cerebellar arteries. ANTERIOR CIRCULATION: --Intracranial internal carotid arteries: Normal. --Anterior cerebral arteries: Normal. Both A1 segments are present. Patent anterior communicating artery. --Middle cerebral arteries: Normal. --Posterior communicating arteries: Present bilaterally. IMPRESSION: 1. Fluid or debris levels within the occipital horns of both lateral ventricles, not visible on the earlier CT. This is concerning for acute intraventricular hemorrhage, particularly following tPA administration. Proteinaceous intraventricular debris could also cause this appearance. Repeat non-contrast head CT is recommended for differentiation. 2. No intraparenchymal hemorrhage. 3. Mild-to-moderate multifocal narrowing of the right PCA. Otherwise normal intracranial MRA. 4. Mild chronic small vessel disease and atrophy. Electronically Signed: By: Ulyses Jarred M.D. On: 06/05/2018 18:20   Mr Brain Wo Contrast  Addendum Date: 06/05/2018   ADDENDUM REPORT: 06/05/2018 18:40 ADDENDUM: Critical Value/emergent results were called by telephone at the time of interpretation on 06/05/2018 at 6:27 pm to Dr. Mortimer Fries , who verbally acknowledged these results. Electronically Signed   By: Ulyses Jarred M.D.   On: 06/05/2018 18:40   Result Date: 06/05/2018 CLINICAL DATA:  Right facial droop and slurred speech. Status post tPA. EXAM: MRI HEAD WITHOUT CONTRAST MRA HEAD WITHOUT CONTRAST TECHNIQUE: Multiplanar, multiecho pulse sequences of the brain and surrounding structures were obtained without intravenous contrast. Angiographic images of the head were obtained using MRA technique without contrast. COMPARISON:  Head CT 06/05/2018 FINDINGS: MRI HEAD FINDINGS BRAIN: There is no acute infarct, acute hemorrhage or mass effect. The midline structures are normal. There are fluid or debris levels within the occipital horns of both lateral ventricles, with low T1 and T2-weighted signal. The white matter signal is normal  for the patient's age. Generalized atrophy without lobar predilection. Single chronic microhemorrhage in the left parietal lobe. SKULL AND UPPER CERVICAL SPINE: The visualized skull base, calvarium, upper cervical spine and extracranial soft tissues are normal. SINUSES/ORBITS: No fluid levels or advanced mucosal thickening. No mastoid or middle ear effusion. The orbits are normal. MRA HEAD FINDINGS POSTERIOR CIRCULATION: --Basilar artery: Normal. --Posterior cerebral arteries: Both are predominantly supplied by large caliber posterior communicating arteries. There is multifocal mild-to-moderate narrowing of the right PCA. Normal left. --Superior cerebellar arteries: Normal. --Inferior cerebellar arteries: Normal anterior and posterior inferior cerebellar arteries. ANTERIOR CIRCULATION: --Intracranial internal carotid arteries: Normal. --Anterior cerebral arteries: Normal. Both A1 segments are present. Patent anterior communicating artery. --Middle cerebral arteries: Normal. --Posterior communicating arteries: Present bilaterally. IMPRESSION: 1. Fluid or debris levels within the occipital horns of both lateral ventricles, not visible on the earlier CT. This is concerning for acute intraventricular hemorrhage, particularly following tPA administration. Proteinaceous intraventricular debris could also cause this appearance. Repeat non-contrast head CT is recommended for differentiation. 2. No intraparenchymal hemorrhage. 3. Mild-to-moderate multifocal  narrowing of the right PCA. Otherwise normal intracranial MRA. 4. Mild chronic small vessel disease and atrophy. Electronically Signed: By: Ulyses Jarred M.D. On: 06/05/2018 18:20   Ct Head Code Stroke Wo Contrast`  Result Date: 06/05/2018 CLINICAL DATA:  Code stroke. Aphasia. Confusion. Last seen normal today. EXAM: CT HEAD WITHOUT CONTRAST TECHNIQUE: Contiguous axial images were obtained from the base of the skull through the vertex without intravenous contrast.  COMPARISON:  11/24/2017 FINDINGS: Brain: No sign of acute infarction, mass lesion, hemorrhage, hydrocephalus or extra-axial collection. Vascular contrast is present related to a previous procedure. Vascular: There is atherosclerotic calcification of the major vessels at the base of the brain. Vascular contrast is present following previous cardiac catheterization. This makes it difficult/impossible to identify hyperdensity related to an embolus, which would be about the same density as the vascular contrast present throughout. Skull: Negative Sinuses/Orbits: Clear/normal Other: None ASPECTS ([place] Stroke Program Early CT Score) - Ganglionic level infarction (caudate, lentiform nuclei, internal capsule, insula, M1-M3 cortex): 7 - Supraganglionic infarction (M4-M6 cortex): 3 Total score (0-10 with 10 being normal): 10 IMPRESSION: 1. No acute finding. Vascular contrast present related to a previous procedure. 2. ASPECTS is 10. 3. These results were called by telephone at the time of interpretation on 06/05/2018 at 1:10 pm to Dr. Paschal Dopp , who verbally acknowledged these results. Electronically Signed   By: Nelson Chimes M.D.   On: 06/05/2018 13:14   Medications:  I have reviewed the patient's current medications. Prior to Admission:  Medications Prior to Admission  Medication Sig Dispense Refill Last Dose  . aspirin EC 81 MG tablet Take 81-324 mg by mouth as needed (chest pain).    06/02/2018 at Unknown time  . atorvastatin (LIPITOR) 80 MG tablet Take 80 mg by mouth at bedtime.    06/02/2018 at Unknown time  . digoxin (LANOXIN) 0.125 MG tablet Take 0.125 mg by mouth daily.   06/02/2018 at Unknown time  . diltiazem (CARDIZEM CD) 120 MG 24 hr capsule Take 120 mg by mouth daily.   06/02/2018 at Unknown time  . empagliflozin (JARDIANCE) 25 MG TABS tablet Take 25 mg by mouth daily.   06/02/2018 at Unknown time  . furosemide (LASIX) 20 MG tablet Take 1 tablet (20 mg total) by mouth daily. 30 tablet 2 06/02/2018 at  Unknown time  . insulin glargine (LANTUS) 100 UNIT/ML injection Inject 0.1 mLs (10 Units total) into the skin at bedtime. (Patient taking differently: Inject 15 Units into the skin at bedtime. ) 10 mL 11 06/02/2018 at 2000  . insulin lispro (HUMALOG) 100 UNIT/ML injection Inject 0.03 mLs (3 Units total) into the skin 3 (three) times daily with meals. (Patient taking differently: Inject 3 Units into the skin 3 (three) times daily with meals. Plus 1 additional unit for every 50 BG points over 200 (max 5 additional units per dose)) 10 mL 0 06/02/2018 at Unknown time  . lisinopril (PRINIVIL,ZESTRIL) 2.5 MG tablet Take 1 tablet (2.5 mg total) by mouth daily. 30 tablet 0 06/02/2018 at Unknown time  . metoprolol tartrate (LOPRESSOR) 25 MG tablet Take 1 tablet (25 mg total) by mouth 3 (three) times daily. 90 tablet 0 06/02/2018 at 2000  . potassium chloride SA (K-DUR,KLOR-CON) 20 MEQ tablet Take 20 mEq by mouth daily.   06/02/2018 at 0800  . rivaroxaban (XARELTO) 20 MG TABS tablet Take 1 tablet (20 mg total) by mouth daily with supper. 30 tablet 0 06/02/2018 at 2000  . HYDROcodone-acetaminophen (NORCO/VICODIN) 5-325 MG tablet 1-2  tabs po qd prn (Patient not taking: Reported on 06/03/2018) 6 tablet 0 Not Taking at Unknown time  . isosorbide mononitrate (IMDUR) 30 MG 24 hr tablet Take 1 tablet (30 mg total) by mouth daily. (Patient not taking: Reported on 06/03/2018) 30 tablet 2 Not Taking at Unknown time  . polyethylene glycol (MIRALAX / GLYCOLAX) packet Take 17 g by mouth daily. (Patient not taking: Reported on 06/03/2018) 30 each 0 Not Taking at Unknown time   Scheduled: . amiodarone  400 mg Oral BID  . atorvastatin  80 mg Oral QHS  . digoxin  0.125 mg Oral Daily  . furosemide  20 mg Oral Daily  . insulin aspart  0-15 Units Subcutaneous TID AC & HS  . insulin glargine  15 Units Subcutaneous QHS  . metoprolol tartrate  25 mg Oral BID  . nitroGLYCERIN  1 inch Topical Q6H  . ranolazine  500 mg Oral BID  .  risperiDONE  0.5 mg Oral QHS  . sodium chloride flush  3 mL Intravenous Q12H   Assessment: 84 y.o. male with past medical history significant for atrial fibrillation with rapid ventricular response on Xarelto, prostrate cancer, CHF, chronic kidney disease, diabetes mellitus without complications, E, hyperlipidemia, hypertension, moderate mitral insufficiency, peripheral vascular disease, pneumonia, and obstructive sleep apnea presenting intitially to the ED on 06/02/2018 with chief complaints of chest pain.  Found to have  NSTEMI status post cardiac cath  06/05/2018. Code stroke initiated hours later due to altered mental status, right facial droop, right-sided weakness and aphasia concerning for ischemic event status post altelplase now with thrombolysis-related intraventricular hemorrhage (IVH).  MRI brain reviewed shows fluid within the occipital horns of both lateral ventricles concerning for acute IVH.  MRA brain with no large vessel occlusion.  Follow-up noncontrast CT head reviewed and shows acute blood within the occipital horns of both lateral ventricles without hydrocephalus, mass-effect or IPH hemorrhage.  Hemoglobin A1c 10.7, LDL pending.  Neurological symptoms improved with no signs of clinical deterioration, decreasing mental status, new headache, acute hypertension, nausea, or vomiting. Current NIHSS 0.  Plan: 1. Repeat head CT in 24 hours s/p IV Alteplase per protocol pending 2. Will not start antiplatelet at least for a week per Neurosurgery recommendation given recent IVH 3. Will continue to hold oral anticoagulation ([place]) for now with plans of re-evaluation in the future given his risk of thromboembolic stroke. Prior studies have demonstrated that [facility] in AF patients after ICH was associated with a significantly reduced incidence of thromboembolism. 4. Glycemic control with HgbA1c <7.0 5. Statin with goal LDL <70 6. Obtain STAT head CT for any new acute headache or new neurological  deficits 7. PT consult, OT consult, Speech consult 8. Carotid dopplers pending 9.Telemetry monitoring  This patient was staffed with Dr. Arnaldo Natal who personally evaluated patient, reviewed documentation and agreed with assessment and plan of care as above.  Rufina Falco, DNP, FNP-BC Board certified Nurse Practitioner Neurology Department    LOS: 3 days   06/06/2018  11:58 AM

## 2018-06-06 NOTE — Progress Notes (Signed)
Pt having difficult with retention and short term memory.  He repeats the same serious of questions repeatedly.  When asked, he states that he has never asked the questions prior.

## 2018-06-06 NOTE — Progress Notes (Signed)
Spoke with NP Stark Klein, nuerology, patient >24hrs post alteplase and patient nuero assessment Q4hrs now.

## 2018-06-06 NOTE — Progress Notes (Addendum)
CRITICAL CARE NOTE  CC  Follow up for acute stroke s/p left heart catheterization   SUBJECTIVE Patient is a 84 y.o. male presents to ICU with stroke s/p left heart catheterization. MRI shows blood present in occipital horn of both ventricles and recommended repeat head CT w/o contrast for confirmation. Repeat head CT last night confirms presence of the blood. Repeated CT at 1PM today continues to show presence of blood in occipital horn of both ventricles but with decreased volume. Patient remains alert and oriented and neurologically stable without any sign of deficit. TPA protocol in place since 1:15PM on 1/13.    SIGNIFICANT EVENTS Repeated CT (last night and at 1PM today), MRI, Neurology consultation   BP (!) 112/99   Pulse (!) 36   Temp 97.8 F (36.6 C) (Oral)   Resp 17   Ht 5\' 7"  (1.702 m)   Wt 80.3 kg   SpO2 97%   BMI 27.73 kg/m    REVIEW OF SYSTEMS Review of Systems  Constitutional: Negative for chills and fever.  Eyes: Negative for blurred vision and double vision.  Respiratory: Negative for cough, shortness of breath and wheezing.   Cardiovascular: Negative for chest pain, palpitations and leg swelling.  Gastrointestinal: Negative for abdominal pain.  Neurological: Negative for dizziness, speech change, focal weakness, loss of consciousness, weakness and headaches.  All other systems reviewed and are negative.   PHYSICAL EXAMINATION:  GENERAL:In no acute distress HEAD: Normocephalic, atraumatic.  EYES: Pupils equal, round, reactive to light.  No scleral icterus.  MOUTH: Moist mucosal membrane. NECK: Supple. No thyromegaly. No nodules. No JVD.  PULMONARY: Clear lung sound bilaterally  CARDIOVASCULAR: +Iregular rate and rhythm. No murmurs, rubs, or gallops.  GASTROINTESTINAL: Soft, nontender, -distended. No masses. Positive bowel sounds. No hepatosplenomegaly.  MUSCULOSKELETAL: No swelling, clubbing, or edema. Lt [facility]KA NEUROLOGIC: Alert and oriented. No focal  deficit, -slurred speech, -weakness, -facial droop SKIN:intact,warm,dry    ASSESSMENT AND PLAN  Patient is a 84 y.o. male presents to the ICU with acute stroke s/p left heart cath with history of uncontrolled DM type 2 and CHF and CAD and Afib with RVR  Acute CVA -MRI shows possible blood present in occipital horn of both ventricles, repeated Head CT's w/o contrast confirm presence of blood  -Patient neurological status remains stable -Follow Neurology recommendation "Observation for additional 24 hours in the hospital and remain off all blood thinners for at least a week. Continue DVT prophylaxis. Repeat head CT if new neurologic symptoms developed"  Intracranial Hemorrhage -Patient neurological status remains stable -Head CT today shows decrease amount of blood -Continue to hold all blood thinner -Repeat head CT if new neurologic symptoms developed   Afib with RVR -Stable, follow Cardiology recommendations Oral amiodarone  Coronary artery disease -Follow Cardiology recommendations  Chronic Systolic CHF -EF 46-56%  Remains SD status  Maretta Bees Patricia Pesa, M.D.  [facility]  Medical Director [place] Director [facility] Cardio-Pulmonary Department

## 2018-06-06 NOTE — Progress Notes (Signed)
   Name: RAYNOR CALCATERRA MRN: [id number] DOB: May 12, 1935     CONSULTATION DATE: 06/02/2018  REFERRING MD :  Otis Dials, MD  CHIEF COMPLAINT:  S/p code stroke  STUDIES:  Troponin, EKG, Head CT w/o contrast   HISTORY OF PRESENT ILLNESS:   Patient with acute CVA s/p CODE stroke First CT head NEG, MRI shows IVH, repeat CT head confirms bleed likley  from TPA  No acute deficits at this time Alert and awake Follows commands    REVIEW OF SYSTEMS:   Review of Systems  HENT: Negative for hearing loss.   Eyes: Negative for blurred vision and double vision.  Respiratory: Negative for cough, shortness of breath and wheezing.   Cardiovascular: Negative for chest pain and palpitations.  Musculoskeletal: Negative for neck pain.  Neurological: Positive for loss of consciousness (No recollection between catherization and ICU) and weakness. Negative for dizziness, speech change and headaches.     VITAL SIGNS: Temp:  [97.6 F (36.4 C)-98.9 F (37.2 C)] 97.8 F (36.6 C) (01/14 0800) Pulse Rate:  [47-136] 87 (01/14 0730) Resp:  [13-26] 21 (01/14 0800) BP: (99-163)/(58-96) 107/58 (01/14 0800) SpO2:  [85 %-100 %] 97 % (01/14 0730) Weight:  [80.3 kg] 80.3 kg (01/14 0406)  Physical Examination:   GENERAL:NAD, no fevers, chills, no weakness no fatigue HEAD: Normocephalic, atraumatic.  EYES: Pupils equal, round, reactive to light. Extraocular muscles intact. No scleral icterus.  MOUTH: Moist mucosal membrane. Dentition intact. No abscess noted.  EAR, NOSE, THROAT: Clear without exudates. No external lesions.  NECK: Supple. No thyromegaly. No nodules. No JVD.  PULMONARY: CTA B/L no wheezing, rhonchi, crackles CARDIOVASCULAR: S1 and S2. Regular rate and rhythm. No murmurs, rubs, or gallops. No edema. Pedal pulses 2+ bilaterally.  GASTROINTESTINAL: Soft, nontender, nondistended. No masses. Positive bowel sounds. No hepatosplenomegaly.  MUSCULOSKELETAL: left BKA NEUROLOGIC: Cranial  nerves II through XII are intact. No gross focal neurological deficits. Sensation intact. Reflexes intact.  PSYCHIATRIC: Mood, affect within normal limits. The patient is awake, alert and oriented x 3. Insight, judgment intact.  ALL OTHER ROS ARE NEGATIVE   ASSESSMENT / PLAN:  Acute CVA complicated by IVH from TPA Neurological symptoms resolved Follow up neurology recs Follow up Neurosurgery recs Avoid Antiplatelet  agents and antocoagulation Follow up cardiology recs  Overall, prognosis is guarded.   Patient is at high risk for recurrent CVA    Valmore Arabie Patricia Pesa, M.D.  [facility]  Medical Director [place] Director [facility] Cardio-Pulmonary Department

## 2018-06-07 LAB — CBC
HCT: 43.3 % (ref 39.0–52.0)
HEMOGLOBIN: 14 g/dL (ref 13.0–17.0)
MCH: 26.7 pg (ref 26.0–34.0)
MCHC: 32.3 g/dL (ref 30.0–36.0)
MCV: 82.5 fL (ref 80.0–100.0)
Platelets: 214 10*3/uL (ref 150–400)
RBC: 5.25 MIL/uL (ref 4.22–5.81)
RDW: 15.5 % (ref 11.5–15.5)
WBC: 11.1 10*3/uL — ABNORMAL HIGH (ref 4.0–10.5)
nRBC: 0 % (ref 0.0–0.2)

## 2018-06-07 LAB — BASIC METABOLIC PANEL
Anion gap: 5 (ref 5–15)
BUN: 37 mg/dL — ABNORMAL HIGH (ref 8–23)
CO2: 24 mmol/L (ref 22–32)
Calcium: 8 mg/dL — ABNORMAL LOW (ref 8.9–10.3)
Chloride: 108 mmol/L (ref 98–111)
Creatinine, Ser: 1.2 mg/dL (ref 0.61–1.24)
GFR calc Af Amer: >60 mL/min (ref >60)
GFR calc non Af Amer: 56 mL/min — ABNORMAL LOW (ref >60)
GLUCOSE: 144 mg/dL — AB (ref 70–99)
Potassium: 3.8 mmol/L (ref 3.5–5.1)
Sodium: 137 mmol/L (ref 135–145)

## 2018-06-07 LAB — GLUCOSE, CAPILLARY
GLUCOSE-CAPILLARY: 272 mg/dL — AB (ref 70–99)
GLUCOSE-CAPILLARY: 173 mg/dL — AB (ref 70–99)
Glucose-Capillary: 138 mg/dL — ABNORMAL HIGH (ref 70–99)
Glucose-Capillary: 226 mg/dL — ABNORMAL HIGH (ref 70–99)

## 2018-06-07 LAB — TROPONIN I: Troponin I: 0.26 ng/mL (ref <0.03)

## 2018-06-07 LAB — MAGNESIUM: Magnesium: 2.2 mg/dL (ref 1.7–2.4)

## 2018-06-07 MED ORDER — SODIUM CHLORIDE 0.9 % IV BOLUS
250.0000 mL | Freq: Once | INTRAVENOUS | Status: AC
  Administered 2018-06-07: 250 mL via INTRAVENOUS

## 2018-06-07 NOTE — Progress Notes (Signed)
Subjective: No new strokes or strokelike symptoms reported.  Patient symptoms improved now back to baseline.  He is alert and oriented x4 this morning.  Family currently at the bedside.  Objective: Current vital signs: BP 137/80   Pulse 62   Temp 97.8 F (36.6 C) (Oral)   Resp (!) 24   Ht 5\' 7"  (1.702 m)   Wt 80.3 kg   SpO2 97%   BMI 27.73 kg/m  Vital signs in last 24 hours: Temp:  [97.8 F (36.6 C)-98.2 F (36.8 C)] 97.8 F (36.6 C) (01/15 0800) Pulse Rate:  [46-109] 62 (01/15 0800) Resp:  [2-25] 24 (01/15 0800) BP: (83-137)/(31-87) 137/80 (01/15 0800) SpO2:  [92 %-99 %] 97 % (01/15 0800)  Intake/Output from previous day: 01/14 0701 - 01/15 0700 In: 960 [P.O.:960] Out: 1400 [Urine:1400] Intake/Output this shift: Total I/O In: 240 [P.O.:240] Out: 675 [Urine:675] Nutritional status:  Diet Order            Diet Carb Modified Fluid consistency: Thin; Room service appropriate? Yes  Diet effective now             Neurological Exam   Mental Status: Alert, oriented x 4, thought content appropriate. Speech fluent without evidence of aphasia. Able to follow 3 step commands without difficulty. Attention span and concentration seemed appropriate  Cranial Nerves: II: Discs flat bilaterally; Visual fields grossly normal, pupils equal, round, reactive to light and accommodation III,IV, VI: ptosis not present, extra-ocular motions intact bilaterally V,VII: smile symmetric, facial light touch sensationintact VIII: hearing normal bilaterally IX,X: gag reflex present XI: bilateral shoulder shrug XII: midline tongue extension Motor: Right :Upper extremity 5/5Without pronator driftLeft: Upper extremity 5/5 without pronator drift Right:Lower extremity 5/5Left: BKA Tone and bulk:normal tone throughout; no atrophy noted Sensory: Pinprick and light touchintact bilaterally Deep Tendon Reflexes: 2+ and symmetric  throughout Plantars: Right:muteLeft: BKA Cerebellar: Finger-to-nosetesting intact bilaterally.Heel to shin testing normal bilaterally Gait: not tested due to safety concerns  Lab Results: Basic Metabolic Panel: Recent Labs  Lab 06/02/18 2233 06/03/18 0347 06/07/18 0226  NA 133* 136 137  K 4.1 4.1 3.8  CL 96* 103 108  CO2 24 23 24   GLUCOSE 517* 330* 144*  BUN 22 23 37*  CREATININE 1.33* 1.19 1.20  CALCIUM 8.5* 8.6* 8.0*  MG  --   --  2.2    Liver Function Tests: No results for input(s): AST, ALT, ALKPHOS, BILITOT, PROT, ALBUMIN in the last 168 hours. No results for input(s): LIPASE, AMYLASE in the last 168 hours. No results for input(s): AMMONIA in the last 168 hours.  CBC: Recent Labs  Lab 06/03/18 0347 06/04/18 0254 06/05/18 0554 06/06/18 1409 06/07/18 0226  WBC 10.6* 9.0 9.3 11.0* 11.1*  HGB 14.7 14.7 14.6 14.5 14.0  HCT 45.4 46.4 45.5 45.3 43.3  MCV 82.5 83.0 81.5 82.8 82.5  PLT 268 262 221 237 214    Cardiac Enzymes: Recent Labs  Lab 06/02/18 2233 06/03/18 0347 06/03/18 0927 06/03/18 1500 06/07/18 0226  TROPONINI 0.07* 2.48* 2.76* 2.34* 0.26*    Lipid Panel: Recent Labs  Lab 06/06/18 1409  CHOL 146  TRIG 104  HDL 30*  CHOLHDL 4.9  VLDL 21  LDLCALC 95    CBG: Recent Labs  Lab 06/06/18 1137 06/06/18 1617 06/06/18 2140 06/07/18 0724 06/07/18 1131  GLUCAP 250* 232* 165* 138* 31*    Microbiology: Results for orders placed or performed during the hospital encounter of 06/02/18  MRSA PCR Screening     Status: None  Collection Time: 06/03/18  3:02 AM  Result Value Ref Range Status   MRSA by PCR NEGATIVE NEGATIVE Final    Comment:        The GeneXpert MRSA Assay (FDA approved for NASAL specimens only), is one component of a comprehensive MRSA colonization surveillance program. It is not intended to diagnose MRSA infection nor to guide or monitor treatment for MRSA infections. Performed at  [facility], [address] [place], [place] [address]     Coagulation Studies: No results for input(s): LABPROT, INR in the last 72 hours.  Imaging: Ct Head Wo Contrast  Result Date: 06/06/2018 CLINICAL DATA:  Stroke, follow-up EXAM: CT HEAD WITHOUT CONTRAST TECHNIQUE: Contiguous axial images were obtained from the base of the skull through the vertex without intravenous contrast. Sagittal and coronal MPR images reconstructed from axial data set. COMPARISON:  06/05/2018 FINDINGS: Brain: Generalized atrophy. Normal ventricular morphology. No midline shift or mass effect. Small vessel chronic ischemic changes of deep cerebral white matter. Acute high attenuation intraventricular hemorrhage is seen at the axial horns of BILATERAL lateral ventricles, decreased. No acute infarction or mass lesion. No additional extra-axial fluid collections. Vascular: Atherosclerotic calcifications of internal carotid arteries bilaterally at skull base Skull: Intact Sinuses/Orbits: Clear Other: N/A IMPRESSION: Atrophy with small vessel chronic ischemic changes of deep cerebral white matter. Acute intraventricular hemorrhage again identified at the occipital horns of both lateral ventricles, decreased in amount since previous exam. No new intracranial abnormalities. Electronically Signed   By: Lavonia Dana M.D.   On: 06/06/2018 13:39   Ct Head Wo Contrast  Result Date: 06/05/2018 CLINICAL DATA:  Status post tPA.  Follow-up of abnormal MRI. EXAM: CT HEAD WITHOUT CONTRAST TECHNIQUE: Contiguous axial images were obtained from the base of the skull through the vertex without intravenous contrast. COMPARISON:  Brain MRI 06/05/2018 FINDINGS: Brain: There is acute blood layering within both ventricles, right greater than left. The size and configuration of the ventricles is unchanged. There is no intraparenchymal hemorrhage. No midline shift or other mass effect. Vascular: There is mild bilateral carotid atherosclerotic  calcification. Skull: Normal Sinuses/Orbits: Normal sinuses and orbits. Other: None IMPRESSION: Acute blood confirmed within the occipital horns of both lateral ventricles. No hydrocephalus, mass effect or intraparenchymal hemorrhage. Critical Value/emergent results were called by telephone at the time of interpretation on 06/05/2018 at 7:46 pm to Dr. Flora Lipps , who verbally acknowledged these results. Electronically Signed   By: Ulyses Jarred M.D.   On: 06/05/2018 19:47   Mr [name] Head Wo Contrast  Addendum Date: 06/05/2018   ADDENDUM REPORT: 06/05/2018 18:40 ADDENDUM: Critical Value/emergent results were called by telephone at the time of interpretation on 06/05/2018 at 6:27 pm to Dr. Mortimer Fries , who verbally acknowledged these results. Electronically Signed   By: Ulyses Jarred M.D.   On: 06/05/2018 18:40   Result Date: 06/05/2018 CLINICAL DATA:  Right facial droop and slurred speech. Status post tPA. EXAM: MRI HEAD WITHOUT CONTRAST MRA HEAD WITHOUT CONTRAST TECHNIQUE: Multiplanar, multiecho pulse sequences of the brain and surrounding structures were obtained without intravenous contrast. Angiographic images of the head were obtained using MRA technique without contrast. COMPARISON:  Head CT 06/05/2018 FINDINGS: MRI HEAD FINDINGS BRAIN: There is no acute infarct, acute hemorrhage or mass effect. The midline structures are normal. There are fluid or debris levels within the occipital horns of both lateral ventricles, with low T1 and T2-weighted signal. The white matter signal is normal for the patient's age. Generalized atrophy without lobar predilection.  Single chronic microhemorrhage in the left parietal lobe. SKULL AND UPPER CERVICAL SPINE: The visualized skull base, calvarium, upper cervical spine and extracranial soft tissues are normal. SINUSES/ORBITS: No fluid levels or advanced mucosal thickening. No mastoid or middle ear effusion. The orbits are normal. MRA HEAD FINDINGS POSTERIOR CIRCULATION: --Basilar  artery: Normal. --Posterior cerebral arteries: Both are predominantly supplied by large caliber posterior communicating arteries. There is multifocal mild-to-moderate narrowing of the right PCA. Normal left. --Superior cerebellar arteries: Normal. --Inferior cerebellar arteries: Normal anterior and posterior inferior cerebellar arteries. ANTERIOR CIRCULATION: --Intracranial internal carotid arteries: Normal. --Anterior cerebral arteries: Normal. Both A1 segments are present. Patent anterior communicating artery. --Middle cerebral arteries: Normal. --Posterior communicating arteries: Present bilaterally. IMPRESSION: 1. Fluid or debris levels within the occipital horns of both lateral ventricles, not visible on the earlier CT. This is concerning for acute intraventricular hemorrhage, particularly following tPA administration. Proteinaceous intraventricular debris could also cause this appearance. Repeat non-contrast head CT is recommended for differentiation. 2. No intraparenchymal hemorrhage. 3. Mild-to-moderate multifocal narrowing of the right PCA. Otherwise normal intracranial MRA. 4. Mild chronic small vessel disease and atrophy. Electronically Signed: By: Ulyses Jarred M.D. On: 06/05/2018 18:20   Mr Brain Wo Contrast  Addendum Date: 06/05/2018   ADDENDUM REPORT: 06/05/2018 18:40 ADDENDUM: Critical Value/emergent results were called by telephone at the time of interpretation on 06/05/2018 at 6:27 pm to Dr. Mortimer Fries , who verbally acknowledged these results. Electronically Signed   By: Ulyses Jarred M.D.   On: 06/05/2018 18:40   Result Date: 06/05/2018 CLINICAL DATA:  Right facial droop and slurred speech. Status post tPA. EXAM: MRI HEAD WITHOUT CONTRAST MRA HEAD WITHOUT CONTRAST TECHNIQUE: Multiplanar, multiecho pulse sequences of the brain and surrounding structures were obtained without intravenous contrast. Angiographic images of the head were obtained using MRA technique without contrast. COMPARISON:  Head  CT 06/05/2018 FINDINGS: MRI HEAD FINDINGS BRAIN: There is no acute infarct, acute hemorrhage or mass effect. The midline structures are normal. There are fluid or debris levels within the occipital horns of both lateral ventricles, with low T1 and T2-weighted signal. The white matter signal is normal for the patient's age. Generalized atrophy without lobar predilection. Single chronic microhemorrhage in the left parietal lobe. SKULL AND UPPER CERVICAL SPINE: The visualized skull base, calvarium, upper cervical spine and extracranial soft tissues are normal. SINUSES/ORBITS: No fluid levels or advanced mucosal thickening. No mastoid or middle ear effusion. The orbits are normal. MRA HEAD FINDINGS POSTERIOR CIRCULATION: --Basilar artery: Normal. --Posterior cerebral arteries: Both are predominantly supplied by large caliber posterior communicating arteries. There is multifocal mild-to-moderate narrowing of the right PCA. Normal left. --Superior cerebellar arteries: Normal. --Inferior cerebellar arteries: Normal anterior and posterior inferior cerebellar arteries. ANTERIOR CIRCULATION: --Intracranial internal carotid arteries: Normal. --Anterior cerebral arteries: Normal. Both A1 segments are present. Patent anterior communicating artery. --Middle cerebral arteries: Normal. --Posterior communicating arteries: Present bilaterally. IMPRESSION: 1. Fluid or debris levels within the occipital horns of both lateral ventricles, not visible on the earlier CT. This is concerning for acute intraventricular hemorrhage, particularly following tPA administration. Proteinaceous intraventricular debris could also cause this appearance. Repeat non-contrast head CT is recommended for differentiation. 2. No intraparenchymal hemorrhage. 3. Mild-to-moderate multifocal narrowing of the right PCA. Otherwise normal intracranial MRA. 4. Mild chronic small vessel disease and atrophy. Electronically Signed: By: Ulyses Jarred M.D. On: 06/05/2018  18:20   US Carotid Bilateral  Result Date: 06/06/2018 CLINICAL DATA:  Stroke symptoms EXAM: BILATERAL CAROTID DUPLEX ULTRASOUND TECHNIQUE: Pearline Cables scale imaging, color  Doppler and duplex ultrasound were performed of bilateral carotid and vertebral arteries in the neck. COMPARISON:  06/06/1998 FINDINGS: Criteria: Quantification of carotid stenosis is based on velocity parameters that correlate the residual internal carotid diameter with NASCET-based stenosis levels, using the diameter of the distal internal carotid lumen as the denominator for stenosis measurement. The following velocity measurements were obtained: RIGHT ICA: 78/13 cm/sec CCA: 83/3 cm/sec SYSTOLIC ICA/CCA RATIO:  1.7 ECA: 100 cm/sec LEFT ICA: 62/11 cm/sec CCA: 82/50 cm/sec SYSTOLIC ICA/CCA RATIO:  1.2 ECA: 91 cm/sec RIGHT CAROTID ARTERY: Minor echogenic shadowing plaque formation. No hemodynamically significant right ICA stenosis, velocity elevation, or turbulent flow. Degree of narrowing less than 50%. RIGHT VERTEBRAL ARTERY:  Antegrade LEFT CAROTID ARTERY: Similar scattered minor echogenic plaque formation. No hemodynamically significant left ICA stenosis, velocity elevation, or turbulent flow. LEFT VERTEBRAL ARTERY:  Antegrade IMPRESSION: Minor carotid atherosclerosis. No hemodynamically significant ICA stenosis. Degree of narrowing less than 50% bilaterally by ultrasound criteria. Patent antegrade vertebral flow bilaterally Electronically Signed   By: Jerilynn Mages.  Shick M.D.   On: 06/06/2018 16:11    Medications:  I have reviewed the patient's current medications. Prior to Admission:  Medications Prior to Admission  Medication Sig Dispense Refill Last Dose  . aspirin EC 81 MG tablet Take 81-324 mg by mouth as needed (chest pain).    06/02/2018 at Unknown time  . atorvastatin (LIPITOR) 80 MG tablet Take 80 mg by mouth at bedtime.    06/02/2018 at Unknown time  . digoxin (LANOXIN) 0.125 MG tablet Take 0.125 mg by mouth daily.   06/02/2018 at Unknown  time  . diltiazem (CARDIZEM CD) 120 MG 24 hr capsule Take 120 mg by mouth daily.   06/02/2018 at Unknown time  . empagliflozin (JARDIANCE) 25 MG TABS tablet Take 25 mg by mouth daily.   06/02/2018 at Unknown time  . furosemide (LASIX) 20 MG tablet Take 1 tablet (20 mg total) by mouth daily. 30 tablet 2 06/02/2018 at Unknown time  . insulin glargine (LANTUS) 100 UNIT/ML injection Inject 0.1 mLs (10 Units total) into the skin at bedtime. (Patient taking differently: Inject 15 Units into the skin at bedtime. ) 10 mL 11 06/02/2018 at 2000  . insulin lispro (HUMALOG) 100 UNIT/ML injection Inject 0.03 mLs (3 Units total) into the skin 3 (three) times daily with meals. (Patient taking differently: Inject 3 Units into the skin 3 (three) times daily with meals. Plus 1 additional unit for every 50 BG points over 200 (max 5 additional units per dose)) 10 mL 0 06/02/2018 at Unknown time  . lisinopril (PRINIVIL,ZESTRIL) 2.5 MG tablet Take 1 tablet (2.5 mg total) by mouth daily. 30 tablet 0 06/02/2018 at Unknown time  . metoprolol tartrate (LOPRESSOR) 25 MG tablet Take 1 tablet (25 mg total) by mouth 3 (three) times daily. 90 tablet 0 06/02/2018 at 2000  . potassium chloride SA (K-DUR,KLOR-CON) 20 MEQ tablet Take 20 mEq by mouth daily.   06/02/2018 at 0800  . rivaroxaban (XARELTO) 20 MG TABS tablet Take 1 tablet (20 mg total) by mouth daily with supper. 30 tablet 0 06/02/2018 at 2000  . HYDROcodone-acetaminophen (NORCO/VICODIN) 5-325 MG tablet 1-2 tabs po qd prn (Patient not taking: Reported on 06/03/2018) 6 tablet 0 Not Taking at Unknown time  . isosorbide mononitrate (IMDUR) 30 MG 24 hr tablet Take 1 tablet (30 mg total) by mouth daily. (Patient not taking: Reported on 06/03/2018) 30 tablet 2 Not Taking at Unknown time  . polyethylene glycol (MIRALAX / GLYCOLAX) packet  Take 17 g by mouth daily. (Patient not taking: Reported on 06/03/2018) 30 each 0 Not Taking at Unknown time   Scheduled: . amiodarone  400 mg Oral BID  .  atorvastatin  80 mg Oral QHS  . digoxin  0.125 mg Oral Daily  . furosemide  20 mg Oral Daily  . insulin aspart  0-15 Units Subcutaneous TID AC & HS  . insulin glargine  15 Units Subcutaneous QHS  . metoprolol tartrate  25 mg Oral BID  . ranolazine  500 mg Oral BID  . risperiDONE  0.5 mg Oral QHS  . sodium chloride flush  3 mL Intravenous Q12H   Assessment:83 y.o.malewith past medical history significant foratrial fibrillation with rapid ventricular response on Xarelto,prostrate cancer, CHF, chronic kidney disease, diabetes mellitus without complications, E, hyperlipidemia, hypertension, moderate mitral insufficiency, peripheral vascular disease, pneumonia,andobstructive sleep apneapresenting intitiallyto the ED on 1/10/2020with chief complaints of chest pain. Found to have NSTEMIstatus post cardiac cath1/13/2020.Code stroke initiated hours later due to altered mental status, right facial droop, right-sided weakness and aphasia concerning for ischemic event status post [place]ow with thrombolysis-related intraventricular hemorrhage (IVH).  MRI brain reviewed shows fluid within the occipital horns of both lateral ventricles concerning for acute IVH.  MRA brain with no large vessel occlusion.  Follow-up noncontrast CT head reviewed and shows acute blood within the occipital horns of both lateral ventricles without hydrocephalus, mass-effect or IPH hemorrhage.  Hemoglobin A1c 10.7, LDL 95.  Neurological symptoms improved with no signs of clinical deterioration, decreasing mental status or focal neurological deficit. Repeat CT head reviewed and shows acute intraventricular hemorrhage at the occipital horns of both lateral ventricles, decreased in amount since previous exam.  Plan: 1.  No antiplatelets or oral anticoagulation ([place]) for now with plans of re-evaluation in the next  7days given his risk of thromboembolic stroke. 2.  Plan to follow-up with cardiology on outpatient basis for  repeat CT scan in 5-7 days and possible restarting oral anticoagulation if CT scan shows resolution of previous hemorrhage.  Prior studies have demonstrated that [facility] in AF patients after ICH was associated with a significantly reduced incidence of thromboembolism. 4. Glycemic control with HgbA1c <7.0 5. Statin with goal LDL <70  This patient was staffed with Dr. Arnaldo Natal who personally evaluated patient, reviewed documentation and agreed with assessment and plan of care as above.  Rufina Falco, DNP, FNP-BC Board certified Nurse Practitioner Neurology Department    LOS: 4 days   06/07/2018  4:09 PM

## 2018-06-07 NOTE — Progress Notes (Signed)
Inpatient Diabetes Program Recommendations  AACE/ADA: New Consensus Statement on Inpatient Glycemic Control  Target Ranges:  Prepandial:   less than 140 mg/dL      Peak postprandial:   less than 180 mg/dL (1-2 hours)      Critically ill patients:  140 - 180 mg/dL  Results for [name], [name] (MRN [id number]) as of 06/07/2018 10:51  Ref. Range 06/06/2018 07:32 06/06/2018 11:37 06/06/2018 16:17 06/06/2018 21:40 06/07/2018 07:24  Glucose-Capillary Latest Ref Range: 70 - 99 mg/dL 106 (H) 250 (H) 232 (H) 165 (H) 138 (H)   Results for [name], [name] (MRN [id number]) as of 06/07/2018 10:51  Ref. Range 06/05/2018 05:54  Hemoglobin A1C Latest Ref Range: 4.8 - 5.6 % 10.7 (H)   Review of Glycemic Control  Diabetes history: DM2 Outpatient Diabetes medications: Lantus 15 units QHS, Humalog 3 units with meals plus 1 unit for every 50 mg/dl above target glucose of 200 mg/dl, Jardiance 25 mg daily Current orders for Inpatient glycemic control: Novolog 0-15 units AC&HS, Lantus 15 units QHS  Inpatient Diabetes Program Recommendations:   Insulin - Meal Coverage: Please consider ordering Novolog 3 units TID with meals for meal coverage if patient eats at least 50% of meals.  HgbA1C: A1C 10.7% on 06/05/18 indicating an average glucose of 260 mg/dl over the past 2-3 months.  Thanks, Barnie Alderman, RN, MSN, CDE Diabetes Coordinator Inpatient Diabetes Program [phone] (Team Pager from 8am to 5pm)

## 2018-06-07 NOTE — Progress Notes (Signed)
Patient Name: [name] Date of Encounter: 06/07/2018  Hospital Problem List     Principal Problem:   Chest pain Active Problems:   Type 2 diabetes mellitus treated with insulin (HCC)   Essential hypertension   PAD (peripheral artery disease) (HCC)   Hyperlipidemia   Chronic diastolic heart failure (HCC)   Atrial fibrillation with rapid ventricular response (HCC)   NSTEMI (non-ST elevated myocardial infarction) [facility])    Patient Profile     84 year old male with history of atrial fibrillation, hypertension, peripheral vascular disease status post non-ST elevation myocardial infarction.  Cardiac cath revealed occluded LAD with hazy ostial RCA and 70 to 80% small distal PDA.  Medical management.  Postop course complicated by TIA.  Received TPA.  Brain CT initially showed no stroke no bleeding.  Post stroke MRI showed ventricular hemorrhage.  No neurologic change CT after 24 hours post lytics showed improved ventricular blood.  No focal stroke.  Patient is asymptomatic.  Subjective   Feels good.  Anxious to go home.  Inpatient Medications    . amiodarone  400 mg Oral BID  . atorvastatin  80 mg Oral QHS  . digoxin  0.125 mg Oral Daily  . furosemide  20 mg Oral Daily  . insulin aspart  0-15 Units Subcutaneous TID AC & HS  . insulin glargine  15 Units Subcutaneous QHS  . metoprolol tartrate  25 mg Oral BID  . ranolazine  500 mg Oral BID  . risperiDONE  0.5 mg Oral QHS  . sodium chloride flush  3 mL Intravenous Q12H    Vital Signs    Vitals:   06/07/18 0500 06/07/18 0508 06/07/18 0600 06/07/18 0800  BP:  122/71 128/70 137/80  Pulse: 64 62 64 62  Resp: 19 17 19  (!) 24  Temp:    97.8 F (36.6 C)  TempSrc:    Oral  SpO2: 96% 96% 96% 97%  Weight:      Height:        Intake/Output Summary (Last 24 hours) at 06/07/2018 1252 Last data filed at 06/07/2018 1135 Gross per 24 hour  Intake 960 ml  Output 1325 ml  Net -365 ml   Filed Weights   06/05/18 0439 06/05/18  1300 06/06/18 0406  Weight: 81.3 kg 80.3 kg 80.3 kg    Physical Exam    GEN: Well nourished, well developed, in no acute distress.  HEENT: normal.  Neck: Supple, no JVD, carotid bruits, or masses. Cardiac: RRR, no murmurs, rubs, or gallops. No clubbing, cyanosis, edema.  Radials/DP/PT 2+ and equal bilaterally.  Respiratory:  Respirations regular and unlabored, clear to auscultation bilaterally. GI: Soft, nontender, nondistended, BS + x 4. MS: no deformity or atrophy. Skin: warm and dry, no rash. Neuro:  Strength and sensation are intact. Psych: Normal affect.  Labs    CBC Recent Labs    06/06/18 1409 06/07/18 0226  WBC 11.0* 11.1*  HGB 14.5 14.0  HCT 45.3 43.3  MCV 82.8 82.5  PLT 237 301   Basic Metabolic Panel Recent Labs    06/07/18 0226  NA 137  K 3.8  CL 108  CO2 24  GLUCOSE 144*  BUN 37*  CREATININE 1.20  CALCIUM 8.0*  MG 2.2   Liver Function Tests No results for input(s): AST, ALT, ALKPHOS, BILITOT, PROT, ALBUMIN in the last 72 hours. No results for input(s): LIPASE, AMYLASE in the last 72 hours. Cardiac Enzymes Recent Labs    06/07/18 0226  TROPONINI 0.26*  BNP No results for input(s): BNP in the last 72 hours. D-Dimer No results for input(s): DDIMER in the last 72 hours. Hemoglobin A1C Recent Labs    06/05/18 0554  HGBA1C 10.7*   Fasting Lipid Panel Recent Labs    06/06/18 1409  CHOL 146  HDL 30*  LDLCALC 95  TRIG 104  CHOLHDL 4.9   Thyroid Function Tests No results for input(s): TSH, T4TOTAL, T3FREE, THYROIDAB in the last 72 hours.  Invalid input(s): FREET3  Telemetry    Atrial fibrillation with controlled ventricular response  ECG    Atrial fibrillation with controlled ventricular response  Radiology    Ct Head Wo Contrast  Result Date: 06/06/2018 CLINICAL DATA:  Stroke, follow-up EXAM: CT HEAD WITHOUT CONTRAST TECHNIQUE: Contiguous axial images were obtained from the base of the skull through the vertex without  intravenous contrast. Sagittal and coronal MPR images reconstructed from axial data set. COMPARISON:  06/05/2018 FINDINGS: Brain: Generalized atrophy. Normal ventricular morphology. No midline shift or mass effect. Small vessel chronic ischemic changes of deep cerebral white matter. Acute high attenuation intraventricular hemorrhage is seen at the axial horns of BILATERAL lateral ventricles, decreased. No acute infarction or mass lesion. No additional extra-axial fluid collections. Vascular: Atherosclerotic calcifications of internal carotid arteries bilaterally at skull base Skull: Intact Sinuses/Orbits: Clear Other: N/A IMPRESSION: Atrophy with small vessel chronic ischemic changes of deep cerebral white matter. Acute intraventricular hemorrhage again identified at the occipital horns of both lateral ventricles, decreased in amount since previous exam. No new intracranial abnormalities. Electronically Signed   By: Lavonia Dana M.D.   On: 06/06/2018 13:39   Ct Head Wo Contrast  Result Date: 06/05/2018 CLINICAL DATA:  Status post tPA.  Follow-up of abnormal MRI. EXAM: CT HEAD WITHOUT CONTRAST TECHNIQUE: Contiguous axial images were obtained from the base of the skull through the vertex without intravenous contrast. COMPARISON:  Brain MRI 06/05/2018 FINDINGS: Brain: There is acute blood layering within both ventricles, right greater than left. The size and configuration of the ventricles is unchanged. There is no intraparenchymal hemorrhage. No midline shift or other mass effect. Vascular: There is mild bilateral carotid atherosclerotic calcification. Skull: Normal Sinuses/Orbits: Normal sinuses and orbits. Other: None IMPRESSION: Acute blood confirmed within the occipital horns of both lateral ventricles. No hydrocephalus, mass effect or intraparenchymal hemorrhage. Critical Value/emergent results were called by telephone at the time of interpretation on 06/05/2018 at 7:46 pm to Dr. Flora Lipps , who verbally  acknowledged these results. Electronically Signed   By: Ulyses Jarred M.D.   On: 06/05/2018 19:47   Mr [name] Head Wo Contrast  Addendum Date: 06/05/2018   ADDENDUM REPORT: 06/05/2018 18:40 ADDENDUM: Critical Value/emergent results were called by telephone at the time of interpretation on 06/05/2018 at 6:27 pm to Dr. Mortimer Fries , who verbally acknowledged these results. Electronically Signed   By: Ulyses Jarred M.D.   On: 06/05/2018 18:40   Result Date: 06/05/2018 CLINICAL DATA:  Right facial droop and slurred speech. Status post tPA. EXAM: MRI HEAD WITHOUT CONTRAST MRA HEAD WITHOUT CONTRAST TECHNIQUE: Multiplanar, multiecho pulse sequences of the brain and surrounding structures were obtained without intravenous contrast. Angiographic images of the head were obtained using MRA technique without contrast. COMPARISON:  Head CT 06/05/2018 FINDINGS: MRI HEAD FINDINGS BRAIN: There is no acute infarct, acute hemorrhage or mass effect. The midline structures are normal. There are fluid or debris levels within the occipital horns of both lateral ventricles, with low T1 and T2-weighted signal. The white matter signal  is normal for the patient's age. Generalized atrophy without lobar predilection. Single chronic microhemorrhage in the left parietal lobe. SKULL AND UPPER CERVICAL SPINE: The visualized skull base, calvarium, upper cervical spine and extracranial soft tissues are normal. SINUSES/ORBITS: No fluid levels or advanced mucosal thickening. No mastoid or middle ear effusion. The orbits are normal. MRA HEAD FINDINGS POSTERIOR CIRCULATION: --Basilar artery: Normal. --Posterior cerebral arteries: Both are predominantly supplied by large caliber posterior communicating arteries. There is multifocal mild-to-moderate narrowing of the right PCA. Normal left. --Superior cerebellar arteries: Normal. --Inferior cerebellar arteries: Normal anterior and posterior inferior cerebellar arteries. ANTERIOR CIRCULATION: --Intracranial  internal carotid arteries: Normal. --Anterior cerebral arteries: Normal. Both A1 segments are present. Patent anterior communicating artery. --Middle cerebral arteries: Normal. --Posterior communicating arteries: Present bilaterally. IMPRESSION: 1. Fluid or debris levels within the occipital horns of both lateral ventricles, not visible on the earlier CT. This is concerning for acute intraventricular hemorrhage, particularly following tPA administration. Proteinaceous intraventricular debris could also cause this appearance. Repeat non-contrast head CT is recommended for differentiation. 2. No intraparenchymal hemorrhage. 3. Mild-to-moderate multifocal narrowing of the right PCA. Otherwise normal intracranial MRA. 4. Mild chronic small vessel disease and atrophy. Electronically Signed: By: Ulyses Jarred M.D. On: 06/05/2018 18:20   Mr Brain Wo Contrast  Addendum Date: 06/05/2018   ADDENDUM REPORT: 06/05/2018 18:40 ADDENDUM: Critical Value/emergent results were called by telephone at the time of interpretation on 06/05/2018 at 6:27 pm to Dr. Mortimer Fries , who verbally acknowledged these results. Electronically Signed   By: Ulyses Jarred M.D.   On: 06/05/2018 18:40   Result Date: 06/05/2018 CLINICAL DATA:  Right facial droop and slurred speech. Status post tPA. EXAM: MRI HEAD WITHOUT CONTRAST MRA HEAD WITHOUT CONTRAST TECHNIQUE: Multiplanar, multiecho pulse sequences of the brain and surrounding structures were obtained without intravenous contrast. Angiographic images of the head were obtained using MRA technique without contrast. COMPARISON:  Head CT 06/05/2018 FINDINGS: MRI HEAD FINDINGS BRAIN: There is no acute infarct, acute hemorrhage or mass effect. The midline structures are normal. There are fluid or debris levels within the occipital horns of both lateral ventricles, with low T1 and T2-weighted signal. The white matter signal is normal for the patient's age. Generalized atrophy without lobar predilection.  Single chronic microhemorrhage in the left parietal lobe. SKULL AND UPPER CERVICAL SPINE: The visualized skull base, calvarium, upper cervical spine and extracranial soft tissues are normal. SINUSES/ORBITS: No fluid levels or advanced mucosal thickening. No mastoid or middle ear effusion. The orbits are normal. MRA HEAD FINDINGS POSTERIOR CIRCULATION: --Basilar artery: Normal. --Posterior cerebral arteries: Both are predominantly supplied by large caliber posterior communicating arteries. There is multifocal mild-to-moderate narrowing of the right PCA. Normal left. --Superior cerebellar arteries: Normal. --Inferior cerebellar arteries: Normal anterior and posterior inferior cerebellar arteries. ANTERIOR CIRCULATION: --Intracranial internal carotid arteries: Normal. --Anterior cerebral arteries: Normal. Both A1 segments are present. Patent anterior communicating artery. --Middle cerebral arteries: Normal. --Posterior communicating arteries: Present bilaterally. IMPRESSION: 1. Fluid or debris levels within the occipital horns of both lateral ventricles, not visible on the earlier CT. This is concerning for acute intraventricular hemorrhage, particularly following tPA administration. Proteinaceous intraventricular debris could also cause this appearance. Repeat non-contrast head CT is recommended for differentiation. 2. No intraparenchymal hemorrhage. 3. Mild-to-moderate multifocal narrowing of the right PCA. Otherwise normal intracranial MRA. 4. Mild chronic small vessel disease and atrophy. Electronically Signed: By: Ulyses Jarred M.D. On: 06/05/2018 18:20   US Carotid Bilateral  Result Date: 06/06/2018 CLINICAL DATA:  Stroke symptoms  EXAM: BILATERAL CAROTID DUPLEX ULTRASOUND TECHNIQUE: Pearline Cables scale imaging, color Doppler and duplex ultrasound were performed of bilateral carotid and vertebral arteries in the neck. COMPARISON:  06/06/1998 FINDINGS: Criteria: Quantification of carotid stenosis is based on velocity  parameters that correlate the residual internal carotid diameter with NASCET-based stenosis levels, using the diameter of the distal internal carotid lumen as the denominator for stenosis measurement. The following velocity measurements were obtained: RIGHT ICA: 78/13 cm/sec CCA: 42/8 cm/sec SYSTOLIC ICA/CCA RATIO:  1.7 ECA: 100 cm/sec LEFT ICA: 62/11 cm/sec CCA: 76/81 cm/sec SYSTOLIC ICA/CCA RATIO:  1.2 ECA: 91 cm/sec RIGHT CAROTID ARTERY: Minor echogenic shadowing plaque formation. No hemodynamically significant right ICA stenosis, velocity elevation, or turbulent flow. Degree of narrowing less than 50%. RIGHT VERTEBRAL ARTERY:  Antegrade LEFT CAROTID ARTERY: Similar scattered minor echogenic plaque formation. No hemodynamically significant left ICA stenosis, velocity elevation, or turbulent flow. LEFT VERTEBRAL ARTERY:  Antegrade IMPRESSION: Minor carotid atherosclerosis. No hemodynamically significant ICA stenosis. Degree of narrowing less than 50% bilaterally by ultrasound criteria. Patent antegrade vertebral flow bilaterally Electronically Signed   By: Jerilynn Mages.  Shick M.D.   On: 06/06/2018 16:11   Dg Chest Port 1 View  Result Date: 06/02/2018 CLINICAL DATA:  Substernal chest pain starting at 7:30. History of atrial fibrillation. Myocardial infarct 3 months ago. EXAM: PORTABLE CHEST 1 VIEW COMPARISON:  01/16/2018 FINDINGS: Mild cardiac enlargement. No vascular congestion, edema, or consolidation. No airspace disease in the lungs. No blunting of costophrenic angles no pneumothorax. Calcification of the aorta. IMPRESSION: Mild cardiac enlargement. No evidence of active pulmonary disease. Electronically Signed   By: Lucienne Capers M.D.   On: 06/02/2018 23:33   Ct Head Code Stroke Wo Contrast`  Result Date: 06/05/2018 CLINICAL DATA:  Code stroke. Aphasia. Confusion. Last seen normal today. EXAM: CT HEAD WITHOUT CONTRAST TECHNIQUE: Contiguous axial images were obtained from the base of the skull through the  vertex without intravenous contrast. COMPARISON:  11/24/2017 FINDINGS: Brain: No sign of acute infarction, mass lesion, hemorrhage, hydrocephalus or extra-axial collection. Vascular contrast is present related to a previous procedure. Vascular: There is atherosclerotic calcification of the major vessels at the base of the brain. Vascular contrast is present following previous cardiac catheterization. This makes it difficult/impossible to identify hyperdensity related to an embolus, which would be about the same density as the vascular contrast present throughout. Skull: Negative Sinuses/Orbits: Clear/normal Other: None ASPECTS ([place] Stroke Program Early CT Score) - Ganglionic level infarction (caudate, lentiform nuclei, internal capsule, insula, M1-M3 cortex): 7 - Supraganglionic infarction (M4-M6 cortex): 3 Total score (0-10 with 10 being normal): 10 IMPRESSION: 1. No acute finding. Vascular contrast present related to a previous procedure. 2. ASPECTS is 10. 3. These results were called by telephone at the time of interpretation on 06/05/2018 at 1:10 pm to Dr. Paschal Dopp , who verbally acknowledged these results. Electronically Signed   By: Nelson Chimes M.D.   On: 06/05/2018 13:14    Assessment & Plan    84 year old male with history of peripheral vascular disease status post AKA admitted with non-ST elevation myocardial infarction likely secondary to demand ischemia in the face of occluded LAD and RCA disease.  Felt to be a candidate for medical management.  Post cath TIA.  Received lytics.  Now has bilateral ventricular blood.  Improved with repeat CT.  Neurologically stable.  Advised to avoid all antiplatelet therapy.  Has remained off of anticoagulation.  Chads score is at least 6.  Long discussion with neurology.  At this point plan  is to remain off of Xarelto for 1 week post lytics.  We will repeat CT.  If ventricular blood is reduced or gone, would resume Xarelto at 20 mg daily.  We will continue to  avoid antiplatelet therapy.  Agree with transfer to floor.  Consider discharge in a.m. if stable.  Signed, Javier Docker Latroya Ng MD 06/07/2018, 12:52 PM  Pager: [phone]

## 2018-06-07 NOTE — Care Management Note (Signed)
Case Management Note  Patient Details  Name: [name] MRN: [id number] Date of Birth: 16-Nov-1934  Subjective/Objective:   Patient admitted for chest pain and found to have NSTEMI.  Patient will be medically managed no PCI.  After cardiac catheterization patient had an acute stroke.  Patient is now floor status, neurology wants to watch him one more day in the hospital and then have him follow up outpatient.  Patient is from home with his wife.  Patient reports that he is independent and walks with a cane.  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  Patient reports that he also has a wheelchair and a walker at home but only needs the cane.  After the BKA pt had home health services but he has since been discharged from [facility].  Patient reports that he does not really have a PCP but he sees Dr. Nehemiah Massed with cardiology, on a regular basis.  RNCM will cont to follow to assess for any needs at discharge- none identified at this time. Doran Clay RN BSN [phone]                   Action/Plan:   Expected Discharge Date:                  Expected Discharge Plan:  Home/Self Care  In-House Referral:     Discharge planning Services     Post Acute Care Choice:    Choice offered to:     DME Arranged:    DME Agency:     HH Arranged:    HH Agency:     Status of Service:  In process, will continue to follow  If discussed at Long Length of Stay Meetings, dates discussed:    Additional Comments:  Shelbie Hutching, RN 06/07/2018, 12:37 PM

## 2018-06-07 NOTE — Progress Notes (Signed)
Patient resting in bed at this time.  Awake, alert with some forgetfulness.  Able to make needs known.  No acute concerns.  Will continue to monitor. Report given to oncoming nurse.

## 2018-06-07 NOTE — Progress Notes (Signed)
CRITICAL CARE NOTE  CC  Follow up Acute CVA  SUBJECTIVE S/p TPA Alert and awake No focal deficits VS stable     SIGNIFICANT EVENTS Repeated CT (last night and at 1PM today), MRI, Neurology consultation   BP 128/70   Pulse 64   Temp 98.2 F (36.8 C) (Oral)   Resp 19   Ht 5\' 7"  (1.702 m)   Wt 80.3 kg   SpO2 96%   BMI 27.73 kg/m    REVIEW OF SYSTEMS Review of Systems  Constitutional: Negative for chills and fever.  Eyes: Negative for blurred vision and double vision.  Respiratory: Negative for cough, shortness of breath and wheezing.   Cardiovascular: Negative for chest pain, palpitations and leg swelling.  Gastrointestinal: Negative for abdominal pain.  Neurological: Negative for dizziness, speech change, focal weakness, loss of consciousness, weakness and headaches.  All other systems reviewed and are negative.   Physical Examination:   GENERAL:NAD, no fevers, chills, no weakness no fatigue HEAD: Normocephalic, atraumatic.  EYES: Pupils equal, round, reactive to light. Extraocular muscles intact. No scleral icterus.  MOUTH: Moist mucosal membrane. Dentition intact. No abscess noted.  EAR, NOSE, THROAT: Clear without exudates. No external lesions.  NECK: Supple. No thyromegaly. No nodules. No JVD.  PULMONARY: CTA B/L no wheezing, rhonchi, crackles CARDIOVASCULAR: S1 and S2. Regular rate and rhythm. No murmurs, rubs, or gallops. No edema. Pedal pulses 2+ bilaterally.  GASTROINTESTINAL: Soft, nontender, nondistended. No masses. Positive bowel sounds. No hepatosplenomegaly.  MUSCULOSKELETAL: No swelling, clubbing, or edema. Range of motion full in all extremities.  NEUROLOGIC: Cranial nerves II through XII are intact. No gross focal neurological deficits. Sensation intact. Reflexes intact.  SKIN: No ulceration, lesions, rashes, or cyanosis. Skin warm and dry. Turgor intact.  PSYCHIATRIC: Mood, affect within normal limits. The patient is awake, alert and oriented x 3.  Insight, judgment intact.  ALL OTHER ROS ARE NEGATIVE       ASSESSMENT AND PLAN  Patient is a 84 y.o. male presents to the ICU with acute stroke s/p left heart cath with history of uncontrolled DM type 2 and CHF and CAD and Afib with RVR   ACUTE CVA complicated by IVH from TPA Follow up neuro and neurosurgery recs  Intracranial Hemorrhage -hold AC for now  afib with RVR Follow up cardiology recs  sCHF-seems to be stable at this time EF 40%  Consider transfer to gen med floor    Gideon Burstein Patricia Pesa, M.D.  [facility]  Medical Director [place] Director [facility] Cardio-Pulmonary Department

## 2018-06-07 NOTE — Progress Notes (Signed)
[facility] [place] NAME: [name]    MR#:  [id number]  DATE OF BIRTH:  04/10/1935  SUBJECTIVE:  CHIEF COMPLAINT:   Chief Complaint  Patient presents with  . Chest Pain   Had cardiac catheterization on 06/05/2018.  Right facial droop with dysarthria and right-sided weakness on 06/05/2018.  Code stroke called.  Transferred to ICU.  Status post TPA.  No chest pain.  Afebrile.  Facial droop, dysarthria and focal weakness have resolved REVIEW OF SYSTEMS:    Review of Systems  Constitutional: Positive for malaise/fatigue. Negative for chills and fever.  HENT: Negative for sore throat.   Eyes: Negative for blurred vision, double vision and pain.  Respiratory: Negative for cough, hemoptysis, shortness of breath and wheezing.   Cardiovascular: Positive for chest pain. Negative for palpitations, orthopnea and leg swelling.  Gastrointestinal: Negative for abdominal pain, constipation, diarrhea, heartburn, nausea and vomiting.  Genitourinary: Negative for dysuria and hematuria.  Musculoskeletal: Negative for back pain and joint pain.  Skin: Negative for rash.  Neurological: Negative for sensory change, speech change, focal weakness and headaches.  Endo/Heme/Allergies: Does not bruise/bleed easily.  Psychiatric/Behavioral: Negative for depression. The patient is not nervous/anxious.     DRUG ALLERGIES:   Allergies  Allergen Reactions  . Contrast Media [Iodinated Diagnostic Agents] Shortness Of Breath    SOB  . Iohexol Shortness Of Breath     Desc: Respiratory Distress, Laryngedema, diaphoresis, Onset Date: [id number]   . Metrizamide Shortness Of Breath    SOB SOB SOB   . Latex Rash    VITALS:  Blood pressure 137/80, pulse 62, temperature 97.8 F (36.6 C), temperature source Oral, resp. rate (!) 24, height 5\' 7"  (1.702 m), weight 80.3 kg, SpO2 97 %.  PHYSICAL EXAMINATION:   Physical Exam  GENERAL:  84 y.o.-year-old patient  lying in the bed with no acute distress.  EYES: Pupils equal, round, reactive to light and accommodation. No scleral icterus. Extraocular muscles intact.  HEENT: Head atraumatic, normocephalic. Oropharynx and nasopharynx clear.  NECK:  Supple, no jugular venous distention. No thyroid enlargement, no tenderness.  LUNGS: Normal breath sounds bilaterally, no wheezing, rales, rhonchi. No use of accessory muscles of respiration.  CARDIOVASCULAR: Irregularly irregular.   ABDOMEN: Soft, nontender, nondistended. Bowel sounds present. No organomegaly or mass.  EXTREMITIES: No cyanosis, clubbing or edema b/l.    NEUROLOGIC: Cranial nerves II through XII are intact. No focal Motor or sensory deficits b/l.   PSYCHIATRIC: The patient is alert and oriented x 3.  SKIN: No obvious rash, lesion, or ulcer.   LABORATORY PANEL:   CBC Recent Labs  Lab 06/07/18 0226  WBC 11.1*  HGB 14.0  HCT 43.3  PLT 214   ------------------------------------------------------------------------------------------------------------------ Chemistries  Recent Labs  Lab 06/07/18 0226  NA 137  K 3.8  CL 108  CO2 24  GLUCOSE 144*  BUN 37*  CREATININE 1.20  CALCIUM 8.0*  MG 2.2   ------------------------------------------------------------------------------------------------------------------  Cardiac Enzymes Recent Labs  Lab 06/07/18 0226  TROPONINI 0.26*   ------------------------------------------------------------------------------------------------------------------  RADIOLOGY:  Ct Head Wo Contrast  Result Date: 06/06/2018 CLINICAL DATA:  Stroke, follow-up EXAM: CT HEAD WITHOUT CONTRAST TECHNIQUE: Contiguous axial images were obtained from the base of the skull through the vertex without intravenous contrast. Sagittal and coronal MPR images reconstructed from axial data set. COMPARISON:  06/05/2018 FINDINGS: Brain: Generalized atrophy. Normal ventricular morphology. No midline shift or mass effect. Small  vessel chronic ischemic changes of deep cerebral white  matter. Acute high attenuation intraventricular hemorrhage is seen at the axial horns of BILATERAL lateral ventricles, decreased. No acute infarction or mass lesion. No additional extra-axial fluid collections. Vascular: Atherosclerotic calcifications of internal carotid arteries bilaterally at skull base Skull: Intact Sinuses/Orbits: Clear Other: N/A IMPRESSION: Atrophy with small vessel chronic ischemic changes of deep cerebral white matter. Acute intraventricular hemorrhage again identified at the occipital horns of both lateral ventricles, decreased in amount since previous exam. No new intracranial abnormalities. Electronically Signed   By: [name] M.D.   On: 06/06/2018 13:39   Ct Head Wo Contrast  Result Date: 06/05/2018 CLINICAL DATA:  Status post tPA.  Follow-up of abnormal MRI. EXAM: CT HEAD WITHOUT CONTRAST TECHNIQUE: Contiguous axial images were obtained from the base of the skull through the vertex without intravenous contrast. COMPARISON:  Brain MRI 06/05/2018 FINDINGS: Brain: There is acute blood layering within both ventricles, right greater than left. The size and configuration of the ventricles is unchanged. There is no intraparenchymal hemorrhage. No midline shift or other mass effect. Vascular: There is mild bilateral carotid atherosclerotic calcification. Skull: Normal Sinuses/Orbits: Normal sinuses and orbits. Other: None IMPRESSION: Acute blood confirmed within the occipital horns of both lateral ventricles. No hydrocephalus, mass effect or intraparenchymal hemorrhage. Critical Value/emergent results were called by telephone at the time of interpretation on 06/05/2018 at 7:46 pm to Dr. Flora [name] , who verbally acknowledged these results. Electronically Signed   By: Jesse Jarred M.D.   On: 06/05/2018 19:47   [name] Head Wo Contrast  Addendum Date: 06/05/2018   ADDENDUM REPORT: 06/05/2018 18:40 ADDENDUM: Critical Value/emergent  results were called by telephone at the time of interpretation on 06/05/2018 at 6:27 pm to Dr. Mortimer [name] , who verbally acknowledged these results. Electronically Signed   By: Jesse Jarred M.D.   On: 06/05/2018 18:40   Result Date: 06/05/2018 CLINICAL DATA:  Right facial droop and slurred speech. Status post tPA. EXAM: MRI HEAD WITHOUT CONTRAST MRA HEAD WITHOUT CONTRAST TECHNIQUE: Multiplanar, multiecho pulse sequences of the brain and surrounding structures were obtained without intravenous contrast. Angiographic images of the head were obtained using MRA technique without contrast. COMPARISON:  Head CT 06/05/2018 FINDINGS: MRI HEAD FINDINGS BRAIN: There is no acute infarct, acute hemorrhage or mass effect. The midline structures are normal. There are fluid or debris levels within the occipital horns of both lateral ventricles, with low T1 and T2-weighted signal. The white matter signal is normal for the patient's age. Generalized atrophy without lobar predilection. Single chronic microhemorrhage in the left parietal lobe. SKULL AND UPPER CERVICAL SPINE: The visualized skull base, calvarium, upper cervical spine and extracranial soft tissues are normal. SINUSES/ORBITS: No fluid levels or advanced mucosal thickening. No mastoid or middle ear effusion. The orbits are normal. MRA HEAD FINDINGS POSTERIOR CIRCULATION: --Basilar artery: Normal. --Posterior cerebral arteries: Both are predominantly supplied by large caliber posterior communicating arteries. There is multifocal mild-to-moderate narrowing of the right PCA. Normal left. --Superior cerebellar arteries: Normal. --Inferior cerebellar arteries: Normal anterior and posterior inferior cerebellar arteries. ANTERIOR CIRCULATION: --Intracranial internal carotid arteries: Normal. --Anterior cerebral arteries: Normal. Both A1 segments are present. Patent anterior communicating artery. --Middle cerebral arteries: Normal. --Posterior communicating arteries: Present  bilaterally. IMPRESSION: 1. Fluid or debris levels within the occipital horns of both lateral ventricles, not visible on the earlier CT. This is concerning for acute intraventricular hemorrhage, particularly following tPA administration. Proteinaceous intraventricular debris could also cause this appearance. Repeat non-contrast head CT is recommended for differentiation. 2. No intraparenchymal  hemorrhage. 3. Mild-to-moderate multifocal narrowing of the right PCA. Otherwise normal intracranial MRA. 4. Mild chronic small vessel disease and atrophy. Electronically Signed: By: Jesse Jarred M.D. On: 06/05/2018 18:20   Mr Brain Wo Contrast  Addendum Date: 06/05/2018   ADDENDUM REPORT: 06/05/2018 18:40 ADDENDUM: Critical Value/emergent results were called by telephone at the time of interpretation on 06/05/2018 at 6:27 pm to Dr. Mortimer [name] , who verbally acknowledged these results. Electronically Signed   By: Jesse Jarred M.D.   On: 06/05/2018 18:40   Result Date: 06/05/2018 CLINICAL DATA:  Right facial droop and slurred speech. Status post tPA. EXAM: MRI HEAD WITHOUT CONTRAST MRA HEAD WITHOUT CONTRAST TECHNIQUE: Multiplanar, multiecho pulse sequences of the brain and surrounding structures were obtained without intravenous contrast. Angiographic images of the head were obtained using MRA technique without contrast. COMPARISON:  Head CT 06/05/2018 FINDINGS: MRI HEAD FINDINGS BRAIN: There is no acute infarct, acute hemorrhage or mass effect. The midline structures are normal. There are fluid or debris levels within the occipital horns of both lateral ventricles, with low T1 and T2-weighted signal. The white matter signal is normal for the patient's age. Generalized atrophy without lobar predilection. Single chronic microhemorrhage in the left parietal lobe. SKULL AND UPPER CERVICAL SPINE: The visualized skull base, calvarium, upper cervical spine and extracranial soft tissues are normal. SINUSES/ORBITS: No fluid levels or  advanced mucosal thickening. No mastoid or middle ear effusion. The orbits are normal. MRA HEAD FINDINGS POSTERIOR CIRCULATION: --Basilar artery: Normal. --Posterior cerebral arteries: Both are predominantly supplied by large caliber posterior communicating arteries. There is multifocal mild-to-moderate narrowing of the right PCA. Normal left. --Superior cerebellar arteries: Normal. --Inferior cerebellar arteries: Normal anterior and posterior inferior cerebellar arteries. ANTERIOR CIRCULATION: --Intracranial internal carotid arteries: Normal. --Anterior cerebral arteries: Normal. Both A1 segments are present. Patent anterior communicating artery. --Middle cerebral arteries: Normal. --Posterior communicating arteries: Present bilaterally. IMPRESSION: 1. Fluid or debris levels within the occipital horns of both lateral ventricles, not visible on the earlier CT. This is concerning for acute intraventricular hemorrhage, particularly following tPA administration. Proteinaceous intraventricular debris could also cause this appearance. Repeat non-contrast head CT is recommended for differentiation. 2. No intraparenchymal hemorrhage. 3. Mild-to-moderate multifocal narrowing of the right PCA. Otherwise normal intracranial MRA. 4. Mild chronic small vessel disease and atrophy. Electronically Signed: By: Jesse Jarred M.D. On: 06/05/2018 18:20   US Carotid Bilateral  Result Date: 06/06/2018 CLINICAL DATA:  Stroke symptoms EXAM: BILATERAL CAROTID DUPLEX ULTRASOUND TECHNIQUE: Pearline Cables scale imaging, color Doppler and duplex ultrasound were performed of bilateral carotid and vertebral arteries in the neck. COMPARISON:  06/06/1998 FINDINGS: Criteria: Quantification of carotid stenosis is based on velocity parameters that correlate the residual internal carotid diameter with NASCET-based stenosis levels, using the diameter of the distal internal carotid lumen as the denominator for stenosis measurement. The following velocity  measurements were obtained: RIGHT ICA: 78/13 cm/sec CCA: 60/4 cm/sec SYSTOLIC ICA/CCA RATIO:  1.7 ECA: 100 cm/sec LEFT ICA: 62/11 cm/sec CCA: 54/09 cm/sec SYSTOLIC ICA/CCA RATIO:  1.2 ECA: 91 cm/sec RIGHT CAROTID ARTERY: Minor echogenic shadowing plaque formation. No hemodynamically significant right ICA stenosis, velocity elevation, or turbulent flow. Degree of narrowing less than 50%. RIGHT VERTEBRAL ARTERY:  Antegrade LEFT CAROTID ARTERY: Similar scattered minor echogenic plaque formation. No hemodynamically significant left ICA stenosis, velocity elevation, or turbulent flow. LEFT VERTEBRAL ARTERY:  Antegrade IMPRESSION: Minor carotid atherosclerosis. No hemodynamically significant ICA stenosis. Degree of narrowing less than 50% bilaterally by ultrasound criteria. Patent antegrade vertebral flow bilaterally Electronically  Signed   By: Jerilynn Mages.  Shick M.D.   On: 06/06/2018 16:11     ASSESSMENT AND PLAN:   *Acute CVA.  MRI negative.  But strokelike symptoms.  Status post TPA.  Complicated by intraventricular hemorrhage.  Repeat CT scan stable.  Discussed with neurology team.  Pla is to start Xarelto for Afib if no worsening on IVH on Ct head in 1 week.  * Atrial fibrillation with rapid ventricular rate  Improved. Now HR 70s with Afib Heparin drip stopped after TPA Of aspirin and anticoagulation due to IVH  *Non-ST elevation MI.  EKG showing inferior lead ST depression.  Patient will be on  Beta-blocker.  Statin.  Multivessel disease with medical management advised.  No PCI. Aspirin held due to intraventricular hemorrhage  *Diabetes mellitus.  Uncontrolled with hyperglycemia.  Continue home medications, sliding scale insulin added.  Diabetic diet.  *Hypertension.  Continue medications  *Chronic systolic chf EF 45 %  All the records are reviewed and case discussed with Care Management/Social Workerr. Management plans discussed with the patient, family and they are in agreement.  CODE STATUS:  Full code  DVT Prophylaxis: SCDs  TOTAL CARE TIME TAKING CARE OF THIS PATIENT: 35 minutes.   POSSIBLE D/C IN 1-2 DAYS, DEPENDING ON CLINICAL CONDITION.  Leia Alf Brixon Zhen M.D on 06/07/2018 at 1:42 PM  Between 7am to 6pm - Pager - [phone]  After 6pm go to [email or web address] - password [email or web address] [facility]  Office  [phone]  CC: Primary care physician; Patient, No Pcp Per  Note: This dictation was prepared with Dragon dictation along with smaller phrase technology. Any transcriptional errors that result from this process are unintentional.

## 2018-06-08 LAB — GLUCOSE, CAPILLARY
GLUCOSE-CAPILLARY: 193 mg/dL — AB (ref 70–99)
GLUCOSE-CAPILLARY: 203 mg/dL — AB (ref 70–99)
Glucose-Capillary: 281 mg/dL — ABNORMAL HIGH (ref 70–99)
Glucose-Capillary: 202 mg/dL — ABNORMAL HIGH (ref 70–99)

## 2018-06-08 MED ORDER — INSULIN GLARGINE 100 UNIT/ML ~~LOC~~ SOLN: 10.0000 [IU] | Freq: Every day | SUBCUTANEOUS | Status: DC

## 2018-06-08 MED ORDER — LIVING WELL WITH DIABETES BOOK
Freq: Once | Status: AC
  Administered 2018-06-08: 17:00:00
  Filled 2018-06-08: qty 1

## 2018-06-08 MED ORDER — INSULIN GLARGINE 100 UNIT/ML ~~LOC~~ SOLN
15.0000 [IU] | Freq: Every day | SUBCUTANEOUS | Status: DC
  Administered 2018-06-08: 15 [IU] via SUBCUTANEOUS
  Filled 2018-06-08 (×2): qty 0.15

## 2018-06-08 NOTE — Progress Notes (Signed)
Report given to Manuela Schwartz RN for patient to be transferred to room 252 with a bag of personal belongings and his prosthetic leg. Wife called about new room transfer and voice mailbox not set up to leave message.

## 2018-06-08 NOTE — Progress Notes (Signed)
Wife at bedside during transfer

## 2018-06-08 NOTE — Progress Notes (Addendum)
Inpatient Diabetes Program Recommendations  AACE/ADA: New Consensus Statement on Inpatient Glycemic Control   Target Ranges:  Prepandial:   less than 140 mg/dL      Peak postprandial:   less than 180 mg/dL (1-2 hours)      Critically ill patients:  140 - 180 mg/dL  Results for [name], [name] (MRN [id number]) as of 06/08/2018 09:23  Ref. Range 06/07/2018 07:24 06/07/2018 11:31 06/07/2018 16:43 06/07/2018 21:29 06/08/2018 07:32  Glucose-Capillary Latest Ref Range: 70 - 99 mg/dL 138 (H) 226 (H) 272 (H) 173 (H) 193 (H)    Review of Glycemic Control  Diabetes history: DM2 Outpatient Diabetes medications: Lantus 15 units QHS, Humalog 3 units with meals plus 1 unit for every 50 mg/dl above target glucose of 200 mg/dl, Jardiance 25 mg daily Current orders for Inpatient glycemic control: Novolog 0-15 units AC&HS, Lantus 15 units QHS  Inpatient Diabetes Program Recommendations:   Insulin - Meal Coverage: Please consider ordering Novolog 4 units TID with meals for meal coverage if patient eats at least 50% of meals.  HgbA1C: A1C 10.7% on 06/05/18 indicating an average glucose of 260 mg/dl over the past 2-3 months.  Addendum 06/08/18@14 :30-Spoke with patient and his wife about diabetes and home regimen for diabetes control. Patient reports that he is followed by Renae Gloss, NP at [facility] for diabetes management and currently he takes Lantus 15 units QHS, Humalog 3 units TID with meals plus additional units if needed (1 unit for every 50 mg/dl over glucose of 200), and Jardiance 25 mg daily as an outpatient for diabetes control. Patient reports that he is taking DM medications as prescribed.    Discussed A1C results (10.7% on 06/05/18) and explained that his current A1C indicates an average glucose of 260 mg/dl over the past 2-3 months. Discussed glucose and A1C goals. Discussed importance of checking CBGs and maintaining good CBG control to prevent long-term and short-term complications.  Explained how hyperglycemia leads to damage within blood vessels which lead to the common complications seen with uncontrolled diabetes. Stressed to the patient the importance of improving glycemic control to prevent further complications from uncontrolled diabetes. Discussed impact of nutrition, exercise, stress, sickness, and medications on diabetes control. Patient states that he feels he would benefit from RD consult while inpatient for more guidance on what to eat. Patient admits that food is one of his issues because he is eating things he knows he shouldn't. Placed order for RD consult and Living Well with Diabetes book which will provide more information on diet as well. Encouraged patient to read the book once received. Encouraged patient to try to make better dietary choices and to keep a good record of glucose readings so adjustments can be made with DM medications if needed. Patient verbalized understanding of information discussed and he states that he has no further questions at this time related to diabetes.  Thanks, Barnie Alderman, RN, MSN, CDE Diabetes Coordinator Inpatient Diabetes Program [phone] (Team Pager from 8am to 5pm)

## 2018-06-08 NOTE — Evaluation (Signed)
Physical Therapy Evaluation Patient Details Name: [name] MRN: [id number] DOB: Jul 08, 1934 Today's Date: 06/08/2018   History of Present Illness  Pt is an 84 year old male admitted with c/o chest pain.  L heart cath with radial approach performed 06/05/2018 after which pt presented with AMS and expressive aphasia.  Code stroke and rapid response called and pt administered TPA for intraventricular hemmorhage.  Significant PMH includes L BKA, CHF, atrial fibrillation, prostate CA, DM and PVD.  Clinical Impression  Pt is an 84 year old male who lives in a one story home with his wife.  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   Pt able to perform bed mobility mod I and sit at EOB with good balance. Pt did present with some confusion and delay in response during conversation.  Oriented to self and situation.  Pt presented with nausea for remainder of session after sitting up.  He was able to transfer to chair with supervision only but immediately requested an emesis bag.  Pt presented as extremely hypertensive but RN will recheck with manual cuff as this is most likely a mechanical error.  PT advised pt to perform chair exercises and walk frequently with RW for safety and pt was agreeable.  Pt presented with fair strength for functional activity but formal MMT not tested due to pt becoming ill upon sitting.  PT will benefit from skilled PT with focus on tolerance to activity, safe functional mobility and strength.    Follow Up Recommendations Home health PT;Supervision - Intermittent    Equipment Recommendations  None recommended by PT    Recommendations for Other Services       Precautions / Restrictions Precautions Precautions: Fall Restrictions Weight Bearing Restrictions: No      Mobility  Bed Mobility Overal bed mobility: Modified Independent             General bed mobility comments: increased time  Transfers Overall transfer level: Needs  assistance Equipment used: None Transfers: Stand Pivot Transfers   Stand pivot transfers: Supervision       General transfer comment: Pt able to use chair  arm to support himself and trasfer on his own.  Became nauseated and sat down abruptly requesting emesis bag.  Ambulation/Gait                Stairs            Wheelchair Mobility    Modified Rankin (Stroke Patients Only)       Balance Overall balance assessment: Needs assistance Sitting-balance support: Feet unsupported Sitting balance-Leahy Scale: Good Sitting balance - Comments: Able to sit to don L prosthesis and sock on R LE with no difficulty                                     Pertinent Vitals/Pain Pain Assessment: No/denies pain    Home Living Family/patient expects to be discharged to:: Private residence Living Arrangements: Spouse/significant other Available Help at Discharge: Family Type of Home: House Home Access: Level entry     Home Layout: One level Home Equipment: Cane - single point;Walker - 2 wheels;Grab bars - toilet      Prior Function Level of Independence: Independent with assistive device(s)         Comments: Able to walk with SPC, mostly household ambulation     Hand Dominance   Dominant Hand: Right  Extremity/Trunk Assessment   Upper Extremity Assessment Upper Extremity Assessment: Overall WFL for tasks assessed    Lower Extremity Assessment Lower Extremity Assessment: Overall WFL for tasks assessed    Cervical / Trunk Assessment Cervical / Trunk Assessment: Normal  Communication   Communication: HOH  Cognition Arousal/Alertness: Awake/alert Behavior During Therapy: WFL for tasks assessed/performed Overall Cognitive Status: No family/caregiver present to determine baseline cognitive functioning                                        General Comments      Exercises General Exercises - Lower Extremity Hip  ABduction/ADduction: Strengthening;Both;10 reps;Seated Straight Leg Raises: Strengthening;Left;10 reps;Seated Other Exercises Other Exercises: Education regarding performance of HEP for LE strength and benefits of frequent movement x7 min   Assessment/Plan    PT Assessment Patient needs continued PT services  PT Problem List Decreased strength;Decreased mobility;Decreased balance;Decreased knowledge of use of DME;Decreased activity tolerance       PT Treatment Interventions DME instruction;Functional mobility training;Balance training;Patient/family education;Gait training;Therapeutic activities;Therapeutic exercise    PT Goals (Current goals can be found in the Care Plan section)  Acute Rehab PT Goals Patient Stated Goal: To return to general daily activity PT Goal Formulation: With patient Time For Goal Achievement: 06/22/18 Potential to Achieve Goals: Good    Frequency 7X/week   Barriers to discharge        Co-evaluation               AM-PAC PT "6 Clicks" Mobility  Outcome Measure Help needed turning from your back to your side while in a flat bed without using bedrails?: None Help needed moving from lying on your back to sitting on the side of a flat bed without using bedrails?: None Help needed moving to and from a bed to a chair (including a wheelchair)?: A Little Help needed standing up from a chair using your arms (e.g., wheelchair or bedside chair)?: A Little Help needed to walk in hospital room?: A Little Help needed climbing 3-5 steps with a railing? : A Little 6 Click Score: 20    End of Session Equipment Utilized During Treatment: Gait belt Activity Tolerance: Patient tolerated treatment well Patient left: in bed Nurse Communication: Mobility status PT Visit Diagnosis: Unsteadiness on feet (R26.81);Muscle weakness (generalized) (M62.81);Difficulty in walking, not elsewhere classified (R26.2)    Time: 1430-1500 PT Time Calculation (min) (ACUTE ONLY):  30 min   Charges:   PT Evaluation $PT Eval Low Complexity: 1 Low PT Treatments $Therapeutic Activity: 8-22 mins        Roxanne Gates, PT, DPT   Roxanne Gates 06/08/2018, 3:10 PM

## 2018-06-08 NOTE — Progress Notes (Signed)
[place] [facility] [place] NAME: Daxter Paule    MR#:  [id number]  DATE OF BIRTH:  1934/08/11  SUBJECTIVE:  CHIEF COMPLAINT:   Chief Complaint  Patient presents with  . Chest Pain   Had cardiac catheterization on 06/05/2018.  Right facial droop with dysarthria and right-sided weakness on 06/05/2018.  Code stroke called.  Transferred to ICU.  Status post TPA. No chest pain.  Afebrile. Facial droop, dysarthria and focal weakness have resolved. Moved out of ICU  REVIEW OF SYSTEMS:    Review of Systems  Constitutional: Positive for malaise/fatigue. Negative for chills and fever.  HENT: Negative for sore throat.   Eyes: Negative for blurred vision, double vision and pain.  Respiratory: Negative for cough, hemoptysis, shortness of breath and wheezing.   Cardiovascular: Positive for chest pain. Negative for palpitations, orthopnea and leg swelling.  Gastrointestinal: Negative for abdominal pain, constipation, diarrhea, heartburn, nausea and vomiting.  Genitourinary: Negative for dysuria and hematuria.  Musculoskeletal: Negative for back pain and joint pain.  Skin: Negative for rash.  Neurological: Negative for sensory change, speech change, focal weakness and headaches.  Endo/Heme/Allergies: Does not bruise/bleed easily.  Psychiatric/Behavioral: Negative for depression. The patient is not nervous/anxious.     DRUG ALLERGIES:   Allergies  Allergen Reactions  . Contrast Media [Iodinated Diagnostic Agents] Shortness Of Breath    SOB  . Iohexol Shortness Of Breath     Desc: Respiratory Distress, Laryngedema, diaphoresis, Onset Date: [id number]   . Metrizamide Shortness Of Breath    SOB SOB SOB   . Latex Rash    VITALS:  Blood pressure (!) 150/91, pulse 78, temperature 98 F (36.7 C), temperature source Oral, resp. rate (!) 22, height 5\' 7"  (1.702 m), weight 80 kg, SpO2 100 %.  PHYSICAL EXAMINATION:   Physical Exam  GENERAL:  84  y.o.-year-old patient lying in the bed with no acute distress.  EYES: Pupils equal, round, reactive to light and accommodation. No scleral icterus. Extraocular muscles intact.  HEENT: Head atraumatic, normocephalic. Oropharynx and nasopharynx clear.  NECK:  Supple, no jugular venous distention. No thyroid enlargement, no tenderness.  LUNGS: Normal breath sounds bilaterally, no wheezing, rales, rhonchi. No use of accessory muscles of respiration.  CARDIOVASCULAR: Irregularly irregular.   ABDOMEN: Soft, nontender, nondistended. Bowel sounds present. No organomegaly or mass.  EXTREMITIES: No cyanosis, clubbing or edema b/l.    NEUROLOGIC: Cranial nerves II through XII are intact. No focal Motor or sensory deficits b/l.   PSYCHIATRIC: The patient is alert and oriented x 3.  SKIN: No obvious rash, lesion, or ulcer.   LABORATORY PANEL:   CBC Recent Labs  Lab 06/07/18 0226  WBC 11.1*  HGB 14.0  HCT 43.3  PLT 214   ------------------------------------------------------------------------------------------------------------------ Chemistries  Recent Labs  Lab 06/07/18 0226  NA 137  K 3.8  CL 108  CO2 24  GLUCOSE 144*  BUN 37*  CREATININE 1.20  CALCIUM 8.0*  MG 2.2   ------------------------------------------------------------------------------------------------------------------  Cardiac Enzymes Recent Labs  Lab 06/07/18 0226  TROPONINI 0.26*   ------------------------------------------------------------------------------------------------------------------  RADIOLOGY:  US Carotid Bilateral  Result Date: 06/06/2018 CLINICAL DATA:  Stroke symptoms EXAM: BILATERAL CAROTID DUPLEX ULTRASOUND TECHNIQUE: Pearline Cables scale imaging, color Doppler and duplex ultrasound were performed of bilateral carotid and vertebral arteries in the neck. COMPARISON:  06/06/1998 FINDINGS: Criteria: Quantification of carotid stenosis is based on velocity parameters that correlate the residual internal  carotid diameter with NASCET-based stenosis levels, using the diameter of the  distal internal carotid lumen as the denominator for stenosis measurement. The following velocity measurements were obtained: RIGHT ICA: 78/13 cm/sec CCA: 45/0 cm/sec SYSTOLIC ICA/CCA RATIO:  1.7 ECA: 100 cm/sec LEFT ICA: 62/11 cm/sec CCA: 38/88 cm/sec SYSTOLIC ICA/CCA RATIO:  1.2 ECA: 91 cm/sec RIGHT CAROTID ARTERY: Minor echogenic shadowing plaque formation. No hemodynamically significant right ICA stenosis, velocity elevation, or turbulent flow. Degree of narrowing less than 50%. RIGHT VERTEBRAL ARTERY:  Antegrade LEFT CAROTID ARTERY: Similar scattered minor echogenic plaque formation. No hemodynamically significant left ICA stenosis, velocity elevation, or turbulent flow. LEFT VERTEBRAL ARTERY:  Antegrade IMPRESSION: Minor carotid atherosclerosis. No hemodynamically significant ICA stenosis. Degree of narrowing less than 50% bilaterally by ultrasound criteria. Patent antegrade vertebral flow bilaterally Electronically Signed   By: Jerilynn Mages.  Shick M.D.   On: 06/06/2018 16:11     ASSESSMENT AND PLAN:   *Acute CVA.  MRI negative.  But strokelike symptoms.  Status post TPA.  Complicated by intraventricular hemorrhage.  Repeat CT scan stable.  Repeat CT head in week and start NOAC if stable  * Atrial fibrillation with rapid ventricular rate  Improved. Now HR 70s with Afib Heparin drip stopped after TPA Off aspirin and anticoagulation due to IVH  *Non-ST elevation MI.  EKG showing inferior lead ST depression.  Patient will be on  Beta-blocker.  Statin.  Multivessel disease with medical management advised.  No PCI. Aspirin held due to intraventricular hemorrhage  *Diabetes mellitus.  Uncontrolled with hyperglycemia.  Continue home medications, sliding scale insulin added.  Diabetic diet.  *Hypertension.  Continue medications  *Chronic systolic chf EF 45 %  All the records are reviewed and case discussed with Care  Management/Social Workerr. Management plans discussed with the patient, family and they are in agreement.  CODE STATUS: Full code  DVT Prophylaxis: SCDs  TOTAL CARE TIME TAKING CARE OF THIS PATIENT: 35 minutes.   POSSIBLE D/C IN 1-2 DAYS, DEPENDING ON CLINICAL CONDITION.  Leia Alf Crews Mccollam M.D on 06/08/2018 at 3:17 PM  Between 7am to 6pm - Pager - [phone]  After 6pm go to [email or web address] - password [email or web address] [facility]  Office  [phone]  CC: Primary care physician; Patient, No Pcp Per  Note: This dictation was prepared with Dragon dictation along with smaller phrase technology. Any transcriptional errors that result from this process are unintentional.

## 2018-06-09 LAB — GLUCOSE, CAPILLARY
Glucose-Capillary: 205 mg/dL — ABNORMAL HIGH (ref 70–99)
Glucose-Capillary: 284 mg/dL — ABNORMAL HIGH (ref 70–99)

## 2018-06-09 MED ORDER — RANOLAZINE ER 500 MG PO TB12: 500.0000 mg | ORAL_TABLET | Freq: Two times a day (BID) | ORAL | 0 refills | Status: DC

## 2018-06-09 MED ORDER — NITROGLYCERIN 0.4 MG SL SUBL: 0.4000 mg | SUBLINGUAL_TABLET | SUBLINGUAL | 0 refills | Status: DC | PRN

## 2018-06-09 MED ORDER — AMIODARONE HCL 400 MG PO TABS: 400.0000 mg | ORAL_TABLET | Freq: Two times a day (BID) | ORAL | 0 refills | Status: DC

## 2018-06-09 NOTE — Discharge Summary (Signed)
[place] at [place] NAME: [name]    MR#:  [id number]  DATE OF BIRTH:  [birth year]  DATE OF ADMISSION:  06/02/2018 ADMITTING PHYSICIAN: Lance Coon, MD  DATE OF DISCHARGE: 06/09/2018  PRIMARY CARE PHYSICIAN: Patient, No Pcp Per   ADMISSION DIAGNOSIS:  Hyperglycemia [R73.9] NSTEMI (non-ST elevated myocardial infarction) ([place]) [I21.4] Atrial fibrillation with rapid ventricular response (HCC) [I48.91]  DISCHARGE DIAGNOSIS:  Principal Problem:   Chest pain Active Problems:   Type 2 diabetes mellitus treated with insulin (HCC)   Essential hypertension   PAD (peripheral artery disease) (HCC)   Hyperlipidemia   Chronic diastolic heart failure (HCC)   Atrial fibrillation with rapid ventricular response (HCC)   NSTEMI (non-ST elevated myocardial infarction) ([place])   SECONDARY DIAGNOSIS:   Past Medical History:  Diagnosis Date  . Arthritis   . Atrial fibrillation ([place])   . Carcinoma of prostate ([place])   . CHF (congestive heart failure) ([place])   . Chronic kidney disease   . Diabetes mellitus without complication ([place])   . ED (erectile dysfunction)   . Frequent urination   . Hyperlipidemia   . Hypertension   . Moderate mitral insufficiency   . Peripheral vascular disease ([place])   . Pneumonia 04/2016  . Prostate cancer ([place])   . Sleep apnea    OSA--USE C-PAP  . Ulcer of left foot due to type 2 diabetes mellitus ([place])   . Urinary stress incontinence, male      ADMITTING HISTORY  HISTORY OF PRESENT ILLNESS:  [name]  is a 84 y.o. male who presents with chief complaint as above.  Patient presents to the ED with acute chest pain.  He states that after he had dinner he developed central chest discomfort.  It was pressure-like in characterization, nonradiating, associated with some mild shortness of breath.  He has a history of CAD as well as A. fib and heart failure.  Here in the ED his work-up showed a mildly elevated  troponin.  Hospitalist were called for further evaluation   HOSPITAL COURSE:   *Acute CVA.  MRI negative.  But strokelike symptoms.  Status post TPA.  Complicated by intraventricular hemorrhage.  Repeat CT scan stable.  Plan is to repeat CT scan again in 1 week which will be on 06/15/2018. Discussed with Dr. Ubaldo Glassing of cardiology who will order this as outpatient.  Referral made to Dr. Manuella Ghazi of neurology at [facility].  * Atrial fibrillation with rapid ventricular rate  Improved. Now HR 70s with Afib Presently on amiodarone and metoprolol. Heparin drip stopped after TPA Off aspirin and anticoagulation due to IVH Can restart anticoagulation if repeat CT scan as outpatient is improved  *Non-ST elevation MI.  EKG showing inferior lead ST depression.  Patient will be on  Beta-blocker.  Statin.  Multivessel disease with medical management advised.  No PCI. Aspirin held due to intraventricular hemorrhage Nitro as needed.  On statin and beta-blockers.  *Diabetes mellitus.  Uncontrolled with hyperglycemia.  Continue home medications, sliding scale insulin added.  Diabetic diet.  *Hypertension.  Continue medications  *Chronic systolic chf EF 45 %  Patient has worked with physical therapy.  Needs home health.  Neurological deficits have resolved.  Will be discharged home.  CONSULTS OBTAINED:  Treatment Team:  Teodoro Spray, MD  DRUG ALLERGIES:   Allergies  Allergen Reactions  . Contrast Media [Iodinated Diagnostic Agents] Shortness Of Breath    SOB  . Iohexol Shortness Of Breath  Desc: Respiratory Distress, Laryngedema, diaphoresis, Onset Date: 76195093   . Metrizamide Shortness Of Breath    SOB SOB SOB   . Latex Rash    DISCHARGE MEDICATIONS:   Allergies as of 06/09/2018      Reactions   Contrast Media [iodinated Diagnostic Agents] Shortness Of Breath   SOB   Iohexol Shortness Of Breath    Desc: Respiratory Distress, Laryngedema, diaphoresis, Onset Date:  [id number]   Metrizamide Shortness Of Breath   SOB SOB SOB   Latex Rash      Medication List    STOP taking these medications   aspirin EC 81 MG tablet   digoxin 0.125 MG tablet Commonly known as:  LANOXIN   diltiazem 120 MG 24 hr capsule Commonly known as:  CARDIZEM CD   HYDROcodone-acetaminophen 5-325 MG tablet Commonly known as:  NORCO/VICODIN   isosorbide mononitrate 30 MG 24 hr tablet Commonly known as:  IMDUR   lisinopril 2.5 MG tablet Commonly known as:  PRINIVIL,ZESTRIL   polyethylene glycol packet Commonly known as:  MIRALAX / GLYCOLAX   rivaroxaban 20 MG Tabs tablet Commonly known as:  XARELTO     TAKE these medications   amiodarone 400 MG tablet Commonly known as:  PACERONE Take 1 tablet (400 mg total) by mouth 2 (two) times daily.   atorvastatin 80 MG tablet Commonly known as:  LIPITOR Take 80 mg by mouth at bedtime.   furosemide 20 MG tablet Commonly known as:  LASIX Take 1 tablet (20 mg total) by mouth daily.   insulin glargine 100 UNIT/ML injection Commonly known as:  LANTUS Inject 0.1 mLs (10 Units total) into the skin at bedtime. What changed:  how much to take   insulin lispro 100 UNIT/ML injection Commonly known as:  HUMALOG Inject 0.03 mLs (3 Units total) into the skin 3 (three) times daily with meals. What changed:  additional instructions   JARDIANCE 25 MG Tabs tablet Generic drug:  empagliflozin Take 25 mg by mouth daily.   metoprolol tartrate 25 MG tablet Commonly known as:  LOPRESSOR Take 1 tablet (25 mg total) by mouth 3 (three) times daily.   nitroGLYCERIN 0.4 MG SL tablet Commonly known as:  NITROSTAT Place 1 tablet (0.4 mg total) under the tongue every 5 (five) minutes as needed for chest pain.   potassium chloride SA 20 MEQ tablet Commonly known as:  K-DUR,KLOR-CON Take 20 mEq by mouth daily.   ranolazine 500 MG 12 hr tablet Commonly known as:  RANEXA Take 1 tablet (500 mg total) by mouth 2 (two) times daily.        Today   VITAL SIGNS:  Blood pressure 140/83, pulse 91, temperature 98.1 F (36.7 C), temperature source Oral, resp. rate 12, height 5\' 7"  (1.702 m), weight 80.7 kg, SpO2 98 %.  I/O:    Intake/Output Summary (Last 24 hours) at 06/09/2018 1215 Last data filed at 06/09/2018 1044 Gross per 24 hour  Intake 240 ml  Output 600 ml  Net -360 ml    PHYSICAL EXAMINATION:  Physical Exam  GENERAL:  84 y.o.-year-old patient lying in the bed with no acute distress.  LUNGS: Normal breath sounds bilaterally, no wheezing, rales,rhonchi or crepitation. No use of accessory muscles of respiration.  CARDIOVASCULAR: S1, S2 normal. No murmurs, rubs, or gallops.  ABDOMEN: Soft, non-tender, non-distended. Bowel sounds present. No organomegaly or mass.  NEUROLOGIC: Moves all 4 extremities. PSYCHIATRIC: The patient is alert and oriented x 3.  SKIN: No obvious rash, lesion, or ulcer.  DATA REVIEW:   CBC Recent Labs  Lab 06/07/18 0226  WBC 11.1*  HGB 14.0  HCT 43.3  PLT 214    Chemistries  Recent Labs  Lab 06/07/18 0226  NA 137  K 3.8  CL 108  CO2 24  GLUCOSE 144*  BUN 37*  CREATININE 1.20  CALCIUM 8.0*  MG 2.2    Cardiac Enzymes Recent Labs  Lab 06/07/18 0226  TROPONINI 0.26*    Microbiology Results  Results for orders placed or performed during the hospital encounter of 06/02/18  MRSA PCR Screening     Status: None   Collection Time: 06/03/18  3:02 AM  Result Value Ref Range Status   MRSA by PCR NEGATIVE NEGATIVE Final    Comment:        The GeneXpert MRSA Assay (FDA approved for NASAL specimens only), is one component of a comprehensive MRSA colonization surveillance program. It is not intended to diagnose MRSA infection nor to guide or monitor treatment for MRSA infections. Performed at [facility], [address], [place], [place] [address]     RADIOLOGY:  No results found.  Follow up with PCP in 1 week.  Management plans discussed with  the patient, family and they are in agreement.  CODE STATUS:     Code Status Orders  (From admission, onward)         Start     Ordered   06/03/18 0302  Full code  Continuous     06/03/18 0301        Code Status History    Date Active Date Inactive Code Status Order ID Comments User Context   01/16/2018 1515 01/18/2018 1635 Full Code [id number]  Dustin Flock, MD ED   11/23/2017 1346 11/28/2017 2020 Full Code [id number]  Nicholes Mango, MD Inpatient   10/26/2017 1534 10/28/2017 1854 Full Code [id number]  Bettey Costa, MD Inpatient   10/26/2017 1437 10/26/2017 1534 Full Code [id number]  Feliberto Gottron, RN Inpatient   10/24/2017 1152 10/26/2017 1437 DNR [id number]  Bettey Costa, MD Inpatient   10/15/2017 0309 10/24/2017 1152 Full Code [id number]  Arta Silence, MD Inpatient   02/10/2017 1904 02/12/2017 1908 Full Code [id number]  Schnier, Dolores Lory, MD ED   02/10/2017 1638 02/10/2017 1904 Full Code [id number]  Sela Hua, PA-C Outpatient   01/08/2017 1342 01/19/2017 2103 Full Code [id number]  Loletha Grayer, MD ED   12/29/2016 2232 12/30/2016 1634 Full Code [id number]  Lance Coon, MD ED   12/20/2016 1251 12/20/2016 1842 Full Code [id number]  Algernon Huxley, MD Inpatient   10/22/2016 0040 10/24/2016 2058 Full Code [id number]  Lance Coon, MD Inpatient   10/14/2016 1201 10/14/2016 1925 Full Code [id number]  Algernon Huxley, MD Inpatient   08/30/2016 1338 09/06/2016 1941 Full Code [id number]  Epifanio Lesches, MD Inpatient   05/19/2016 0454 05/22/2016 1907 Full Code [id number]  Saundra Shelling, MD Inpatient      TOTAL TIME TAKING CARE OF THIS PATIENT ON DAY OF DISCHARGE: more than 30 minutes.   [name] M.D on 06/09/2018 at 12:15 PM  Between 7am to 6pm - Pager - [phone]  After 6pm go to [email or web address] - password [email or web address] [facility]  Office  [phone]  CC: Primary care physician; Patient, No Pcp Per  Note: This dictation was prepared with Dragon dictation along with  smaller phrase technology. Any transcriptional errors that result from this process are unintentional.

## 2018-06-09 NOTE — Progress Notes (Signed)
Inpatient Diabetes Program Recommendations  AACE/ADA: New Consensus Statement on Inpatient Glycemic Control   Target Ranges:  Prepandial:   less than 140 mg/dL      Peak postprandial:   less than 180 mg/dL (1-2 hours)      Critically ill patients:  140 - 180 mg/dL   Results for RICKARD, KENNERLY (MRN [id number]) as of 06/09/2018 09:04  Ref. Range 06/08/2018 07:32 06/08/2018 11:49 06/08/2018 16:28 06/08/2018 21:25 06/09/2018 07:38  Glucose-Capillary Latest Ref Range: 70 - 99 mg/dL 193 (H) 281 (H) 203 (H) 202 (H) 205 (H)   Review of Glycemic Control  Diabetes history:DM2 Outpatient Diabetes medications:Lantus 15 units QHS, Humalog 3 units with meals plus 1 unit for every 50 mg/dl above target glucose of 200 mg/dl, Jardiance 25 mg daily Current orders for Inpatient glycemic control:Novolog 0-15 units AC&HS, Lantus 15 units QHS  Inpatient Diabetes Program Recommendations:  Insulin-Basal: Please consider increasing Lantus to 18 units QHS.  Insulin - Meal Coverage:Please consider ordering Novolog 4 units TID with meals for meal coverage if patient eats at least 50% of meals.  HgbA1C: A1C 10.7% on 06/05/18 indicating an average glucose of 260 mg/dl over the past 2-3 months.  Thanks, Barnie Alderman, RN, MSN, CDE Diabetes Coordinator Inpatient Diabetes Program [phone] (Team Pager from 8am to 5pm)

## 2018-06-09 NOTE — Plan of Care (Signed)
  Problem: Clinical Measurements: Goal: Ability to maintain clinical measurements within normal limits will improve Outcome: Progressing Goal: Cardiovascular complication will be avoided Outcome: Progressing   Problem: Activity: Goal: Risk for activity intolerance will decrease Outcome: Progressing   Problem: Pain Managment: Goal: General experience of comfort will improve Outcome: Progressing   Problem: Safety: Goal: Ability to remain free from injury will improve Outcome: Progressing   Problem: Cardiovascular: Goal: Ability to achieve and maintain adequate cardiovascular perfusion will improve Outcome: Progressing Goal: Vascular access site(s) Level 0-1 will be maintained Outcome: Progressing   Problem: Cardiovascular: Goal: Vascular access site(s) Level 0-1 will be maintained Outcome: Progressing   Problem: Coping: Goal: Will verbalize positive feelings about self Outcome: Progressing   Problem: Self-Care: Goal: Ability to participate in self-care as condition permits will improve Outcome: Progressing Goal: Verbalization of feelings and concerns over difficulty with self-care will improve Outcome: Progressing Goal: Ability to communicate needs accurately will improve Outcome: Progressing   Problem: Intracerebral Hemorrhage Tissue Perfusion: Goal: Complications of Intracerebral Hemorrhage will be minimized Outcome: Progressing

## 2018-06-09 NOTE — Progress Notes (Signed)
Physical Therapy Treatment Patient Details Name: [name] MRN: [id number] DOB: 1934/06/28 Today's Date: 06/09/2018    History of Present Illness [name] is an 84 year old male admitted with c/o chest pain.  L heart cath with radial approach performed 06/05/2018 after which pt presented with AMS and expressive aphasia.  Code stroke and rapid response called and pt administered TPA for intraventricular hemmorhage.  Significant PMH includes L BKA, CHF, atrial fibrillation, prostate CA, DM and PVD.    PT Comments    Pt in bed upon entry agreeable to PT session. Pt has finished breakfast, denies nausea this date. Pt moves to EOB well, low effort, dons his shrinker/socks/prosthesis for Lt BKA, transfers with RW at supervision, and tolerates 300+ ft AMB. Pt reports baseline level function of RLE/RUE, denies any word finishing difficulty. Pt progressing well toward goals.   Follow Up Recommendations  Home health PT;Supervision - Intermittent     Equipment Recommendations  None recommended by PT    Recommendations for Other Services       Precautions / Restrictions Precautions Precautions: Fall Precaution Comments: Don right shoe for AMB  Restrictions Weight Bearing Restrictions: No    Mobility  Bed Mobility Overal bed mobility: Independent                Transfers Overall transfer level: Modified independent Equipment used: Rolling walker (2 wheeled) Transfers: Sit to/from Stand Sit to Stand: Modified independent (Device/Increase time)         General transfer comment: prosthesis.   Ambulation/Gait Ambulation/Gait assistance: Min Gaffer (Feet): 350 Feet Assistive device: Rolling walker (2 wheeled) Gait Pattern/deviations: Step-through pattern;Trunk flexed Gait velocity: 0.35m/s  Gait velocity interpretation: 1.31 - 2.62 ft/sec, indicative of limited community ambulator General Gait Details: increased trunk flexion with fatigue,  VC given, Left toe scuffing x3, worse with fatigue.    Stairs             Wheelchair Mobility    Modified Rankin (Stroke Patients Only)       Balance Overall balance assessment: Modified Independent;Mild deficits observed, not formally tested                                          Cognition Arousal/Alertness: Awake/alert Behavior During Therapy: WFL for tasks assessed/performed Overall Cognitive Status: Within Functional Limits for tasks assessed                                        Exercises      General Comments        Pertinent Vitals/Pain Pain Assessment: No/denies pain    Home Living                      Prior Function            PT Goals (current goals can now be found in the care plan section) Acute Rehab PT Goals Patient Stated Goal: To return to general daily activity PT Goal Formulation: With patient Time For Goal Achievement: 06/22/18 Potential to Achieve Goals: Good Progress towards PT goals: Progressing toward goals    Frequency    7X/week      PT Plan Current plan remains appropriate    Co-evaluation  AM-PAC PT "6 Clicks" Mobility   Outcome Measure  Help needed turning from your back to your side while in a flat bed without using bedrails?: None Help needed moving from lying on your back to sitting on the side of a flat bed without using bedrails?: None Help needed moving to and from a bed to a chair (including a wheelchair)?: A Little Help needed standing up from a chair using your arms (e.g., wheelchair or bedside chair)?: A Little Help needed to walk in hospital room?: A Little Help needed climbing 3-5 steps with a railing? : A Little 6 Click Score: 20    End of Session Equipment Utilized During Treatment: Gait belt Activity Tolerance: Patient tolerated treatment well Patient left: in chair;with chair alarm set;with call bell/phone within reach Nurse  Communication: Mobility status(needs shoe for AMB ) PT Visit Diagnosis: Unsteadiness on feet (R26.81);Muscle weakness (generalized) (M62.81);Difficulty in walking, not elsewhere classified (R26.2)     Time: 9924-2683 PT Time Calculation (min) (ACUTE ONLY): 18 min  Charges:  $Therapeutic Activity: 8-22 mins                     10:22 AM, 06/09/18 Etta Grandchild, PT, DPT Physical Therapist - [facility]  [phone] ([place])     Buccola,Allan C 06/09/2018, 10:20 AM

## 2018-06-09 NOTE — Progress Notes (Signed)
Advance care planning  Purpose of Encounter NSTEMI, CVA  Parties in Attendance Patient  Patients Decisional capacity Patient oriented.  Able to make medical decisions  No documented healthcare power of attorney or advanced directives in place  Discussed with patient regarding non-ST elevation MI, CVA, prognosis and treatment plan. CODE STATUS is full code with full scope of treatment.   Time spent - 17 minutes

## 2018-06-09 NOTE — Plan of Care (Signed)
Nutrition Education Note  RD consulted for nutrition education regarding a Heart Healthy/Diabetic diet.   84 year old male admitted with c/o chest pain.  L heart cath with radial approach performed 06/05/2018 after which pt presented with AMS and expressive aphasia. Significant PMH includes L BKA, CHF, atrial fibrillation, prostate CA, DM and PVD.   RD provided "Heart Healthy Nutrition Therapy" handout from the Academy of [employer] and [employer]. Reviewed patient's dietary recall. Provided examples on ways to decrease sodium and fat intake in diet. Discouraged intake of processed foods and use of salt shaker. Encouraged fresh fruits and vegetables as well as whole grain sources of carbohydrates to maximize fiber intake.   RD provided "Nutirtion Therapy with Type II Diabetes" handout from the Academy of Nutrition and [employer]etics. Discussed different food groups and their effects on blood sugar, emphasizing carbohydrate-containing foods. Provided list of carbohydrates and recommended serving sizes of common foods.  Discussed importance of controlled and consistent carbohydrate intake throughout the day. Provided examples of ways to balance meals/snacks and encouraged intake of high-fiber, whole grain complex carbohydrates.   Teach back method used.  Expect fair compliance.  Body mass index is 27.86 kg/m.   Current diet order is HH/CHO, patient is consuming approximately 100% of meals at this time. Labs and medications reviewed. No further nutrition interventions warranted at this time. RD contact information provided. If additional nutrition issues arise, please re-consult RD.  Koleen Distance MS, RD, LDN Pager #- [phone] Office#- [phone] After Hours Pager: [phone]

## 2018-06-09 NOTE — Care Management Important Message (Signed)
Copy of signed Medicare IM left with patient in room. 

## 2018-06-13 NOTE — Care Management (Addendum)
Post discharge note entry 06/13/2018:  RNCM requested to review chart for home health establishment. RNCM spoke with patient and his wife [name] by phone [phone].  They were in the process of going to Cardiologist appointment with Dr. Ubaldo Glassing.  [name] shared that he had home health PT in the past however they do not recall agency name.  She states she "does not feel that he will need home health or PT".  She states "he still drives and does well with walking with a walker".  She states that he sees [name] with Endocrinology at [facility] for diabetes management. No CM needs at this time. Per PT note on day of discharge: "Pt moves to EOB well, low effort, dons his shrinker/socks/prosthesis for Lt BKA, transfers with RW at supervision, and tolerates 300+ ft AMB. Pt reports baseline level function of RLE/RUE, denies any word finishing difficulty.

## 2018-06-26 ENCOUNTER — Encounter: Payer: Medicare Other | Attending: Physician Assistant | Admitting: Physician Assistant

## 2018-06-26 DIAGNOSIS — I480 Paroxysmal atrial fibrillation: Secondary | ICD-10-CM | POA: Insufficient documentation

## 2018-06-26 DIAGNOSIS — M1990 Unspecified osteoarthritis, unspecified site: Secondary | ICD-10-CM | POA: Diagnosis not present

## 2018-06-26 DIAGNOSIS — G4730 Sleep apnea, unspecified: Secondary | ICD-10-CM | POA: Insufficient documentation

## 2018-06-26 DIAGNOSIS — I252 Old myocardial infarction: Secondary | ICD-10-CM | POA: Insufficient documentation

## 2018-06-26 DIAGNOSIS — E1151 Type 2 diabetes mellitus with diabetic peripheral angiopathy without gangrene: Secondary | ICD-10-CM | POA: Diagnosis not present

## 2018-06-26 DIAGNOSIS — I509 Heart failure, unspecified: Secondary | ICD-10-CM | POA: Diagnosis not present

## 2018-06-26 DIAGNOSIS — G40909 Epilepsy, unspecified, not intractable, without status epilepticus: Secondary | ICD-10-CM | POA: Diagnosis not present

## 2018-06-26 DIAGNOSIS — I5042 Chronic combined systolic (congestive) and diastolic (congestive) heart failure: Secondary | ICD-10-CM | POA: Diagnosis not present

## 2018-06-26 DIAGNOSIS — L97811 Non-pressure chronic ulcer of other part of right lower leg limited to breakdown of skin: Secondary | ICD-10-CM | POA: Diagnosis not present

## 2018-06-26 DIAGNOSIS — Z8249 Family history of ischemic heart disease and other diseases of the circulatory system: Secondary | ICD-10-CM | POA: Diagnosis not present

## 2018-06-26 DIAGNOSIS — Z87891 Personal history of nicotine dependence: Secondary | ICD-10-CM | POA: Diagnosis not present

## 2018-06-26 DIAGNOSIS — E11622 Type 2 diabetes mellitus with other skin ulcer: Secondary | ICD-10-CM | POA: Diagnosis present

## 2018-06-26 DIAGNOSIS — I110 Hypertensive heart disease with heart failure: Secondary | ICD-10-CM | POA: Insufficient documentation

## 2018-06-26 DIAGNOSIS — Z823 Family history of stroke: Secondary | ICD-10-CM | POA: Insufficient documentation

## 2018-06-26 DIAGNOSIS — E1140 Type 2 diabetes mellitus with diabetic neuropathy, unspecified: Secondary | ICD-10-CM | POA: Insufficient documentation

## 2018-06-26 DIAGNOSIS — Z809 Family history of malignant neoplasm, unspecified: Secondary | ICD-10-CM | POA: Diagnosis not present

## 2018-06-26 DIAGNOSIS — Z89512 Acquired absence of left leg below knee: Secondary | ICD-10-CM | POA: Diagnosis not present

## 2018-06-26 DIAGNOSIS — E1136 Type 2 diabetes mellitus with diabetic cataract: Secondary | ICD-10-CM | POA: Insufficient documentation

## 2018-06-26 DIAGNOSIS — I2510 Atherosclerotic heart disease of native coronary artery without angina pectoris: Secondary | ICD-10-CM | POA: Insufficient documentation

## 2018-06-27 NOTE — Progress Notes (Signed)
TIELER, COURNOYER ([id number]) Visit Report for 06/26/2018 Abuse/Suicide Risk Screen Details Patient Name: HAKAN, NUDELMAN A. Date of Service: 06/26/2018 8:45 AM Medical Record Number: [id number] Patient Account Number: [id number] Date of Birth/Sex: 08/21/1934 (84 y.o. M) Treating RN: Montey Hora Primary Care Zorianna Taliaferro: PATIENT, NO Other Clinician: Referring Ilianna Bown: Referral, Self Treating Louay Myrie/Extender: STONE III, HOYT Weeks in Treatment: 0 Abuse/Suicide Risk Screen Items Answer ABUSE/SUICIDE RISK SCREEN: Has anyone close to you tried to hurt or harm you recentlyo No Do you feel uncomfortable with anyone in your familyo No Has anyone forced you do things that you didnot want to doo No Do you have any thoughts of harming yourselfo No Patient displays signs or symptoms of abuse and/or neglect. No Electronic Signature(s) Signed: 06/26/2018 4:27:23 PM By: Montey Hora Entered By: Montey Hora on 06/26/2018 09:00:15 [name] ([id number]) -------------------------------------------------------------------------------- Activities of Daily Living Details Patient Name: [name] Date of Service: 06/26/2018 8:45 AM Medical Record Number: [id number] Patient Account Number: [id number] Date of Birth/Sex: [birth year] (84 y.o. M) Treating RN: Montey Hora Primary Care Larenz Frasier: PATIENT, NO Other Clinician: Referring Teva Bronkema: Referral, Self Treating Brendyn Mclaren/Extender: STONE III, HOYT Weeks in Treatment: 0 Activities of Daily Living Items Answer Activities of Daily Living (Please select one for each item) Drive Automobile Not Able Take Medications Completely Able Use Telephone Completely Able Care for Appearance Completely Able Use Toilet Completely Able Bath / Shower Need Assistance Dress Self Completely Able Feed Self Completely Able Walk Completely Able Get In / Out Bed Completely [place] for Self Need Assistance Electronic Signature(s) Signed: 06/26/2018 4:27:23 PM By: Montey Hora Entered By: Montey Hora on 06/26/2018 09:00:49 [name] ([id number]) -------------------------------------------------------------------------------- Education Assessment Details Patient Name: [name] Date of Service: 06/26/2018 8:45 AM Medical Record Number: [id number] Patient Account Number: [id number] Date of Birth/Sex: May 11, 1935 (84 y.o. M) Treating RN: Montey Hora Primary Care Malaiya Paczkowski: PATIENT, NO Other Clinician: Referring Chundra Sauerwein: Referral, Self Treating Dillian Feig/Extender: Melburn Hake, HOYT Weeks in Treatment: 0 Primary Learner Assessed: Patient Learning Preferences/Education Level/Primary Language Learning Preference: Explanation, Demonstration Highest Education Level: High School Preferred Language: English Cognitive Barrier Assessment/Beliefs Language Barrier: No Translator Needed: No Memory Deficit: No Emotional Barrier: No Cultural/Religious Beliefs Affecting Medical Care: No Physical Barrier Assessment Impaired Vision: No Impaired Hearing: No Decreased Hand dexterity: No Knowledge/Comprehension Assessment Knowledge Level: Medium Comprehension Level: Medium Ability to understand written Medium instructions: Ability to understand verbal Medium instructions: Motivation Assessment Anxiety Level: Calm Cooperation: Cooperative Education Importance: Acknowledges Need Interest in Health Problems: Asks Questions Perception: Coherent Willingness to Engage in Self- Medium Management Activities: Readiness to Engage in Self- Medium Management Activities: Electronic Signature(s) Signed: 06/26/2018 4:27:23 PM By: Montey Hora Entered By: Montey Hora on 06/26/2018 09:01:22 [name] ([id number]) -------------------------------------------------------------------------------- Fall Risk Assessment Details Patient  Name: [name] Date of Service: 06/26/2018 8:45 AM Medical Record Number: [id number] Patient Account Number: [id number] Date of Birth/Sex: 01-04-1935 (84 y.o. M) Treating RN: Montey Hora Primary Care Caswell Alvillar: PATIENT, NO Other Clinician: Referring Sameerah Nachtigal: Referral, Self Treating Flannery Cavallero/Extender: STONE III, HOYT Weeks in Treatment: 0 Fall Risk Assessment Items Have you had 2 or more falls in the last 12 monthso 0 No Have you had any fall that resulted in injury in the last 12 monthso 0 No FALL RISK ASSESSMENT: History of falling - immediate or within 3 months 0 No Secondary diagnosis 0 No Ambulatory aid None/bed rest/wheelchair/nurse 0 Yes Crutches/cane/walker 0 No Furniture 0  No IV Access/Saline Lock 0 No Gait/Training Normal/bed rest/immobile 0 No Weak 10 Yes Impaired 20 Yes Mental Status Oriented to own ability 0 Yes Electronic Signature(s) Signed: 06/26/2018 4:27:23 PM By: Montey Hora Entered By: Montey Hora on 06/26/2018 09:01:33 [name] ([id number]) -------------------------------------------------------------------------------- Foot Assessment Details Patient Name: [name] Date of Service: 06/26/2018 8:45 AM Medical Record Number: [id number] Patient Account Number: [id number] Date of Birth/Sex: 02-26-1935 (84 y.o. M) Treating RN: Montey Hora Primary Care Leni Pankonin: PATIENT, NO Other Clinician: Referring Chalese Peach: Referral, Self Treating Zoila Ditullio/Extender: STONE III, HOYT Weeks in Treatment: 0 Foot Assessment Items Site Locations + = Sensation present, - = Sensation absent, C = Callus, U = Ulcer R = Redness, W = Warmth, M = Maceration, PU = Pre-ulcerative lesion F = Fissure, S = Swelling, D = Dryness Assessment Right: Left: Other Deformity: No No Prior Foot Ulcer: No No Prior Amputation: No Yes Charcot Joint: No No Ambulatory Status: Ambulatory With Help Assistance Device: Wheelchair Gait: Steady Electronic  Signature(s) Signed: 06/26/2018 4:27:23 PM By: Montey Hora Entered By: Montey Hora on 06/26/2018 09:04:43 [place], [name] ([id number]) -------------------------------------------------------------------------------- Nutrition Risk Assessment Details Patient Name: [name] Date of Service: 06/26/2018 8:45 AM Medical Record Number: [id number] Patient Account Number: [id number] Date of Birth/Sex: [birth year] (84 y.o. M) Treating RN: Montey Hora Primary Care Maxine Huynh: PATIENT, NO Other Clinician: Referring Alias Villagran: Referral, Self Treating Dinari Stgermaine/Extender: STONE III, HOYT Weeks in Treatment: 0 Height (in): 67 Weight (lbs): 180 Body Mass Index (BMI): 28.2 Nutrition Risk Assessment Items NUTRITION RISK SCREEN: I have an illness or condition that made me change the kind and/or amount of 0 No food I eat I eat fewer than two meals per day 0 No I eat few fruits and vegetables, or milk products 0 No I have three or more drinks of beer, liquor or wine almost every day 0 No I have tooth or mouth problems that make it hard for me to eat 0 No I don't always have enough money to buy the food I need 0 No I eat alone most of the time 0 No I take three or more different prescribed or over-the-counter drugs a day 1 Yes Without wanting to, I have lost or gained 10 pounds in the last six months 0 No I am not always physically able to shop, cook and/or feed myself 0 No Nutrition Protocols Good Risk Protocol 0 No interventions needed Moderate Risk Protocol Electronic Signature(s) Signed: 06/26/2018 4:27:23 PM By: Montey Hora Entered By: Montey Hora on 06/26/2018 09:01:41

## 2018-06-27 NOTE — Progress Notes (Signed)
ELIH, MOONEY ([id number]) Visit Report for 06/26/2018 Chief Complaint Document Details Patient Name: [name], [name] Date of Service: 06/26/2018 8:45 AM Medical Record Number: [id number] Patient Account Number: [id number] Date of Birth/Sex: [birth year] (84 y.o. M) Treating RN: Harold Barban Primary Care Provider: PATIENT, NO Other Clinician: Referring Provider: Referral, Self Treating Provider/Extender: STONE III, Ladarrius Bogdanski Weeks in Treatment: 0 Information Obtained from: Patient Chief Complaint Right leg ulcers Electronic Signature(s) Signed: 06/27/2018 8:18:44 AM By: Worthy Keeler PA-C Entered By: Worthy Keeler on 06/26/2018 09:22:20 [name] ([id number]) -------------------------------------------------------------------------------- HPI Details Patient Name: Jesse Floor A. Date of Service: 06/26/2018 8:45 AM Medical Record Number: [id number] Patient Account Number: [id number] Date of Birth/Sex: 1935/04/27 (84 y.o. M) Treating RN: Harold Barban Primary Care Provider: PATIENT, NO Other Clinician: Referring Provider: Referral, Self Treating Provider/Extender: STONE III, Anginette Espejo Weeks in Treatment: 0 History of Present Illness Location: Left foot surgical site in the distal/lateral region Quality: Patient describes the pain a sore/burning sensation Severity: 3/10 at rest and 8/10 with manipulation Duration: Patientos surgery was on 10/15/16 with Dr. Albertine Patricia DPM Context: Patient had an amputation of the left 3rd - 5th toes on 10/15/16 and the surgical site has not healed following Modifying Factors: Currently this has been packed with saline soaked gauze in a wet to dry format Associated Signs and Symptoms: HTN, DM, A-fib, chronic Coumadin use HPI Description: 11/29/16 patient presents today for evaluation concerning a nonhealing surgical wound following amputation of his left third through fifth toes which was performed on 10/15/16. Unfortunately following this  amputation the circle side has not closed appropriately and he continues to have a significant amount of discomfort. He did have an x-ray performed on 10/21/16 which showed him being status post D3 toe amputation but otherwise no acute findings. Patient also has venous imaging of the left lower extremity which revealed no evidence of DVT on 10/22/16. His last hemoglobin A 1C was 8.5 and this was on 10/22/16 and prior to his amputation patient did have vascular surgery with Dr. Leotis Pain where he had a catheter placed in the left anterior tibialis/dorsalis pedis artery from the right femoral approach. Percutaneous transluminal angioplasty of the left anterior tibial artery/dorsalis pedis artery was performed. This procedure apparently went very well. Unfortunately his healing just has not been what both Dr. Elvina Mattes nor himself were expecting or wanting. The question posed to [place] today is whether hyperbaric therapy would be of benefit for him. READMISSION 02/01/18 This is now a 84 year old man we had in the clinic over 2 years ago. At that point he was here for evaluation of amputations of several of his toes on his left foot with a nonhealing surgical wound. He was only here for 1 visit. The patient tells me he went on to have a left below-knee amputation slightly over a year ago. Sometime earlier this year perhaps in April he traumatized his right shin and he has had an open wound here since then. He is already seen Dr. Lucky Cowboy and vein and vascular. He had noninvasive tests that showed an ABI on the right of 1.23 but a TBI in the right of only 0.27. He had monophasic waveforms at the anterior tibial and biphasic at the posterior tibial. The overall interpretation was that although his resting right ankle brachial index is within normal limits this may be falsely elevated. Furthermore his toe brachial index was quite a bit reduced. According to the last note from vein and vascular on 01/27/18 they discussed  angiogram with possible revascularization on the right. He was felt to be reasonably high risk because of his and inability inability to stay still requiring general anesthesia therefore. They told him to think about it and get back to them. He was referred here for review of this wound. As I understand things they've been using Xeroform I'm not clear what they've been putting on this although they did talk about "an ointment". 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 During his admission to hospital in July MRSA was apparently cultured from this wound and he received a course of Doxy. As he has had recent noninvasive arterial studies done on 01/27/18,no ABIs were felt to be necessary here. 02/08/18; patient arrives in with a better looking wound. Dimensions are smaller. using silver collagen 02/15/18; wound is measuring smaller. Still with adherent surface material requiring debridement. We've been using silver collagen 02/22/18; wound is measuring smaller. No debridement is required. We're using silver collagen under compression. 03/01/18; continued improvement in wound dimensions. No debridement is required. We've been using silver collagen under compression 03/08/18; wound stalled in terms of dimensions this week. Surface of the wound still looks healthy we've been using silver collagen under compression he has home health changing this. Change to [facility] today [name], [name] ([id number]) 03/15/18; inadvertently put silver collagen on the wound today. In general his wound continues to contract. Using border foam cover 03/22/18; changed a dressing to calcium alginate. Wound on the right leg is just about closed. He continues to have irritated hyperkeratotic skin around the wound. He  claims he did not have a skin lesion in this area that the wound was caused by trauma although the history is vague 03/29/18; using calcium alginate. Came in with debris over wound surface although her intake nurse noted drainage. The skin around this looks a lot better with the triamcinolone I prescribed. 04/05/18; used Calcium alginate. The wound is closed. This was initially traumatic it looks as though he has some degree of chronic venous insufficiency may be with stasis dermatitis. For now I have recommended simply support stockings Readmission: 06/26/18 on evaluation today patient presents for reevaluation in our clinic concerning issue on his right medial lower extremity where it appears in my pinion that this was likely small skin tears from him scraping this part of his leg with that being said there does not appear to be any significant injury just several small. Partial thickness skin tears. Overall this is good news and that it's not too deep and hopefully should healed quite significantly faster compared to his previous admissions and what was going on at that point. There is no evidence of infection significantly although there's a little erythema around I believe this is likely more due to inflammation that it is to actually infection. Fortunately there is no sign of systemic infection. No fevers, chills, nausea, or vomiting noted at this time. Electronic Signature(s) Signed: 06/27/2018 8:18:44 AM By: Worthy Keeler PA-C Entered By: Worthy Keeler on 06/26/2018 09:44:53 [name] ([id number]) -------------------------------------------------------------------------------- Physical Exam Details Patient Name: Jesse Floor A. Date of Service: 06/26/2018 8:45 AM Medical Record Number: [id number] Patient Account Number: [id number] Date of Birth/Sex: 1935-01-30 (84 y.o. M) Treating RN: Harold Barban Primary Care Provider: PATIENT, NO Other Clinician: Referring Provider:  Referral,  Self Treating Provider/Extender: STONE III, Kelon Easom Weeks in Treatment: 0 Constitutional sitting or standing blood pressure is within target range for patient.. pulse regular and within target range for patient.Marland Kitchen respirations regular, non-labored and within target range for patient.Marland Kitchen temperature within target range for patient.. Well- nourished and well-hydrated in no acute distress. Eyes conjunctiva clear no eyelid edema noted. pupils equal round and reactive to light and accommodation. Ears, Nose, Mouth, and Throat no gross abnormality of ear auricles or external auditory canals. normal hearing noted during conversation. mucus membranes moist. Respiratory normal breathing without difficulty. clear to auscultation bilaterally. Cardiovascular regular rate and rhythm with normal S1, S2. 2+ dorsalis pedis/posterior tibialis pulses right LE. no clubbing, cyanosis, significant edema, <3 sec cap refill right LE. Gastrointestinal (GI) soft, non-tender, non-distended, +BS. no ventral hernia noted. Musculoskeletal normal gait and posture. Left BKA. Psychiatric this patient is able to make decisions and demonstrates good insight into disease process. Alert and Oriented x 3. pleasant and cooperative. Notes On inspection today again patient has several partial thickness skin tears noted all of these are all very small which is excellent news. There is no evidence of infection at this time. No sharp debridement was required at this point in the wounds actually appear to be doing very well. Some of their theme around the wound may be evidence of mild infection beginning but fortunately there is nothing significant at this time. Electronic Signature(s) Signed: 06/27/2018 8:18:44 AM By: Worthy Keeler PA-C Entered By: Worthy Keeler on 06/26/2018 09:45:50 [name] ([id number]) -------------------------------------------------------------------------------- Physician Orders  Details Patient Name: Jesse Floor A. Date of Service: 06/26/2018 8:45 AM Medical Record Number: [id number] Patient Account Number: [id number] Date of Birth/Sex: 13-Sep-1934 (84 y.o. 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 Wound #4 Right,Distal Lower Leg o May Shower, gently pat wound dry prior to applying new dressing. Primary Wound Dressing Wound #3 Right,Proximal Lower Leg o Other: - Antibiotic ointment Wound #4 Right,Distal Lower Leg o Other: - Antibiotic ointment Secondary Dressing Wound #3 Right,Proximal Lower Leg o Non-adherent pad - conform, stretch net Wound #4 Right,Distal Lower Leg o Non-adherent pad - conform, stretch net Dressing Change Frequency Wound #3 Right,Proximal Lower Leg o Change dressing every day. Wound #4 Right,Distal Lower Leg o Change dressing every day. Follow-up Appointments Wound #3 Right,Proximal Lower Leg o Return Appointment in 1 week. [name], [name] ([id number]) Wound #4 Right,Distal Lower Leg o Return Appointment in 1 week. Patient Medications Allergies: Iodinated Contrast- Oral and IV Dye, latex Notifications Medication Indication Start  End mupirocin 06/26/2018 DOSE topical 2 % ointment - ointment topical applied daily to the wound bed then covered with the dressing as discussed Electronic Signature(s) Signed: 06/26/2018 9:47:46 AM By: Worthy Keeler PA-C Entered By: Worthy Keeler on 06/26/2018 09:47:45 [name], [name] Kitchen ([id number]) -------------------------------------------------------------------------------- Problem List Details Patient Name: Jesse Floor A.  Date of Service: 06/26/2018 8:45 AM Medical Record Number: [id number] Patient Account Number: [id number] Date of Birth/Sex: 07/13/1934 (84 y.o. M) Treating RN: Harold Barban Primary Care Provider: PATIENT, NO Other Clinician: Referring Provider: Referral, Self Treating Provider/Extender: Melburn Hake, Kohana Amble Weeks in Treatment: 0 Active Problems ICD-10 Evaluated Encounter Code Description Active Date Today Diagnosis E11.622 Type 2 diabetes mellitus with other skin ulcer 06/26/2018 No Yes E11.51 Type 2 diabetes mellitus with diabetic peripheral angiopathy 06/26/2018 No Yes without gangrene L97.811 Non-pressure chronic ulcer of other part of right lower leg 06/26/2018 No Yes limited to breakdown of skin Z89.512 Acquired absence of left leg below knee 06/26/2018 No Yes I50.42 Chronic combined systolic (congestive) and diastolic 05/30/4942 No Yes (congestive) heart failure I10 Essential (primary) hypertension 06/26/2018 No Yes I48.0 Paroxysmal atrial fibrillation 06/26/2018 No Yes Inactive Problems Resolved Problems Electronic Signature(s) Signed: 06/27/2018 8:18:44 AM By: Worthy Keeler PA-C Entered By: Worthy Keeler on 06/26/2018 09:22:06 [name], [name] ([id number]) -------------------------------------------------------------------------------- Progress Note Details Patient Name: Jesse Floor A. Date of Service: 06/26/2018 8:45 AM Medical Record Number: [id number] Patient Account Number: [id number] Date of Birth/Sex: Oct 02, 1934 (84 y.o. M) Treating RN: Harold Barban Primary Care Provider: PATIENT, NO Other Clinician: Referring Provider: Referral, Self Treating Provider/Extender: STONE III, Lejuan Botto Weeks in Treatment: 0 Subjective Chief Complaint Information obtained from Patient Right leg ulcers History of Present Illness (HPI) The following HPI elements were documented for the patient's wound: Location: Left foot surgical site in the distal/lateral region Quality: Patient describes the pain a sore/burning sensation Severity: 3/10 at rest and 8/10 with manipulation Duration: Patient s surgery was on 10/15/16 with Dr. Albertine Patricia DPM Context: Patient had an amputation of the left 3rd - 5th toes on 10/15/16 and the surgical site has not healed following Modifying Factors: Currently this has been packed with saline soaked gauze in a wet to dry format Associated Signs and Symptoms: HTN, DM, A-fib, chronic Coumadin use 11/29/16 patient presents today for evaluation concerning a nonhealing surgical wound following amputation of his left third through fifth toes which was performed on 10/15/16. Unfortunately following this amputation the circle side has not closed appropriately and he continues to have a significant amount of discomfort. He did have an x-ray performed on 10/21/16 which showed him being status post D3 toe amputation but otherwise no acute findings. Patient also has venous imaging of the left lower extremity which revealed no evidence of DVT on 10/22/16. His last hemoglobin A 1C was 8.5 and this was on 10/22/16 and prior to his amputation patient did have vascular surgery with Dr. Leotis Pain where he had a catheter placed in the left anterior tibialis/dorsalis pedis artery from the right femoral approach. Percutaneous transluminal angioplasty of the left anterior tibial artery/dorsalis pedis artery was performed. This procedure apparently went very well. Unfortunately his healing just has not been what both Dr. Elvina Mattes nor himself were expecting  or wanting. The question posed to [place] today is whether hyperbaric therapy would be of benefit for him. READMISSION 02/01/18 This is now a 84 year old man we had in the clinic over 2 years ago. At that point he was here for evaluation of amputations of several of his toes on his left foot with a nonhealing surgical wound. He was only here for 1 visit. The patient tells me he went on to have a left below-knee amputation slightly over a year ago. Sometime earlier this year perhaps in April he traumatized his right shin and he has had an open wound  here since then. He is already seen Dr. Lucky Cowboy and vein and vascular. He had noninvasive tests that showed an ABI on the right of 1.23 but a TBI in the right of only 0.27. He had monophasic waveforms at the anterior tibial and biphasic at the posterior tibial. The overall interpretation was that although his resting right ankle brachial index is within normal limits this may be falsely elevated. Furthermore his toe brachial index was quite a bit reduced. According to the last note from vein and vascular on 01/27/18 they discussed angiogram with possible revascularization on the right. He was felt to be reasonably high risk because of his and inability inability to stay still requiring general anesthesia therefore. They told him to think about it and get back to them. He was referred here for review of this wound. As I understand things they've been using Xeroform I'm not clear what they've been putting on this although they did talk about "an ointment". 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 During his admission to hospital in July MRSA was apparently cultured from this wound and he received a course of Doxy. As he has had recent noninvasive arterial studies  done on 01/27/18,no ABIs were felt to be necessary here. [name], [name] ([id number]) 02/08/18; patient arrives in with a better looking wound. Dimensions are smaller. using silver collagen 02/15/18; wound is measuring smaller. Still with adherent surface material requiring debridement. We've been using silver collagen 02/22/18; wound is measuring smaller. No debridement is required. We're using silver collagen under compression. 03/01/18; continued improvement in wound dimensions. No debridement is required. We've been using silver collagen under compression 03/08/18; wound stalled in terms of dimensions this week. Surface of the wound still looks healthy we've been using silver collagen under compression he has home health changing this. Change to Hydrofera Blue today 03/15/18; inadvertently put silver collagen on the wound today. In general his wound continues to contract. Using border foam cover 03/22/18; changed a dressing to calcium alginate. Wound on the right leg is just about closed. He continues to have irritated hyperkeratotic skin around the wound. He claims he did not have a skin lesion in this area that the wound was caused by trauma although the history is vague 03/29/18; using calcium alginate. Came in with debris over wound surface although her intake nurse noted drainage. The skin around this looks a lot better with the triamcinolone I prescribed. 04/05/18; used Calcium alginate. The wound is closed. This was initially traumatic it looks as though he has some degree of chronic venous insufficiency may be with stasis dermatitis. For now I have recommended simply support stockings Readmission: 06/26/18 on evaluation today patient presents for reevaluation in our clinic concerning issue on his right medial lower extremity where it appears in my pinion that this was likely small skin tears from him scraping this part of his leg with that being said there does not appear to be any  significant injury just several small. Partial thickness skin tears. Overall this is good news and that it's not too deep and hopefully should healed quite significantly faster compared to his previous admissions and what was going on at that point. There is no evidence of infection significantly although there's a little erythema around I believe this is likely more due  to inflammation that it is to actually infection. Fortunately there is no sign of systemic infection. No fevers, chills, nausea, or vomiting noted at this time. Wound History Patient presents with 2 open wounds that have been present for approximately 1 week. Patient has been treating wounds in the following manner: open to air. Laboratory tests have not been performed in the last month. Patient reportedly has not tested positive for an antibiotic resistant organism. Patient reportedly has not tested positive for osteomyelitis. Patient reportedly has had testing performed to evaluate circulation in the legs. Patient History Information obtained from Patient. Allergies Iodinated Contrast- Oral and IV Dye, latex Family History Cancer - Siblings, Hypertension - Siblings, Stroke - Siblings, No family history of Diabetes, Heart Disease, Kidney Disease, Lung Disease, Seizures, Thyroid Problems, Tuberculosis. Social History Former smoker, Marital Status - Married, Alcohol Use - Never, Drug Use - No History, Caffeine Use - Daily. Medical History Eyes Patient has history of Cataracts - removed Denies history of Glaucoma, Optic Neuritis Ear/Nose/Mouth/Throat Denies history of Chronic sinus problems/congestion, Middle ear problems Hematologic/Lymphatic Denies history of Anemia, Hemophilia, Human Immunodeficiency Virus, Lymphedema, Sickle Cell Disease Respiratory Patient has history of Sleep Apnea - c-pap Denies history of Aspiration, Asthma, Chronic Obstructive Pulmonary Disease (COPD), Pneumothorax, Tuberculosis [name], Jesse  A. ([id number]) Cardiovascular Patient has history of Arrhythmia, Congestive Heart Failure, Hypertension, Peripheral Arterial Disease Denies history of Angina, Coronary Artery Disease, Deep Vein Thrombosis, Hypotension, Myocardial Infarction, Peripheral Venous Disease, Phlebitis, Vasculitis Gastrointestinal Denies history of Cirrhosis , Colitis, Crohn s, Hepatitis A, Hepatitis B, Hepatitis C Endocrine Patient has history of Type II Diabetes Denies history of Type I Diabetes Genitourinary Denies history of End Stage Renal Disease Immunological Denies history of Lupus Erythematosus, Raynaud s, Scleroderma Integumentary (Skin) Denies history of History of Burn, History of pressure wounds Musculoskeletal Patient has history of Osteoarthritis Denies history of Gout, Rheumatoid Arthritis, Osteomyelitis Neurologic Denies history of Dementia, Neuropathy, Quadriplegia, Paraplegia, Seizure Disorder Oncologic Denies history of Received Chemotherapy, Received Radiation Psychiatric Denies history of Anorexia/bulimia, Confinement Anxiety Medical And Surgical History Notes Constitutional Symptoms (General Health) HTN; Diabetes Type II, A-Fib; Neuropathy Musculoskeletal L BKA Review of Systems (ROS) Eyes Denies complaints or symptoms of Dry Eyes, Vision Changes, Glasses / Contacts. Ear/Nose/Mouth/Throat Denies complaints or symptoms of Difficult clearing ears, Sinusitis. Hematologic/Lymphatic Denies complaints or symptoms of Bleeding / Clotting Disorders, Human Immunodeficiency Virus. Respiratory Denies complaints or symptoms of Chronic or frequent coughs, Shortness of Breath. Cardiovascular Denies complaints or symptoms of Chest pain, LE edema. Gastrointestinal Denies complaints or symptoms of Frequent diarrhea, Nausea, Vomiting. Endocrine Denies complaints or symptoms of Hepatitis, Thyroid disease, Polydypsia (Excessive Thirst). Genitourinary Denies complaints or symptoms of Kidney  failure/ Dialysis, Incontinence/dribbling. Immunological Denies complaints or symptoms of Hives, Itching. Integumentary (Skin) Complains or has symptoms of Wounds. Denies complaints or symptoms of Bleeding or bruising tendency, Breakdown, Swelling. Musculoskeletal Denies complaints or symptoms of Muscle Pain, Muscle Weakness. Neurologic Denies complaints or symptoms of Numbness/parasthesias, Focal/Weakness. Psychiatric Denies complaints or symptoms of Anxiety, Claustrophobia. [name], [name] ([id number]) Objective Constitutional sitting or standing blood pressure is within target range for patient.. pulse regular and within target range for patient.Marland Kitchen respirations regular, non-labored and within target range for patient.Marland Kitchen temperature within target range for patient.. Well- nourished and well-hydrated in no acute distress. Vitals Time Taken: 8:57 AM, Height: 67 in, Source: Measured, Weight: 180 lbs, Source: Measured, BMI: 28.2, Temperature: 97.6 F, Pulse: 64 bpm, Respiratory Rate: 16 breaths/min, Blood Pressure: 136/79 mmHg. Eyes conjunctiva clear no eyelid edema  noted. pupils equal round and reactive to light and accommodation. Ears, Nose, Mouth, and Throat no gross abnormality of ear auricles or external auditory canals. normal hearing noted during conversation. mucus membranes moist. Respiratory normal breathing without difficulty. clear to auscultation bilaterally. Cardiovascular regular rate and rhythm with normal S1, S2. 2+ dorsalis pedis/posterior tibialis pulses right LE. no clubbing, cyanosis, significant edema, Gastrointestinal (GI) soft, non-tender, non-distended, +BS. no ventral hernia noted. Musculoskeletal normal gait and posture. Left BKA. Psychiatric this patient is able to make decisions and demonstrates good insight into disease process. Alert and Oriented x 3. pleasant and cooperative. General Notes: On inspection today again patient has several partial  thickness skin tears noted all of these are all very small which is excellent news. There is no evidence of infection at this time. No sharp debridement was required at this point in the wounds actually appear to be doing very well. Some of their theme around the wound may be evidence of mild infection beginning but fortunately there is nothing significant at this time. Integumentary (Hair, Skin) Wound #3 status is Open. Original cause of wound was Gradually Appeared. The wound is located on the Right,Proximal Lower Leg. The wound measures 0.4cm length x 0.5cm width x 0.1cm depth; 0.157cm^2 area and 0.016cm^3 volume. The wound is limited to skin breakdown. There is no tunneling or undermining noted. There is a none present amount of drainage noted. The wound margin is flat and intact. There is no granulation within the wound bed. There is a large (67-100%) amount of necrotic tissue within the wound bed including Eschar. The periwound skin appearance did not exhibit: Callus, Crepitus, Excoriation, Induration, Rash, Scarring, Dry/Scaly, Maceration, Atrophie Blanche, Cyanosis, Ecchymosis, Hemosiderin Staining, Mottled, Pallor, Rubor, Erythema. Periwound temperature was noted as No Abnormality. Wound #4 status is Open. Original cause of wound was Gradually Appeared. The wound is located on the Right,Distal Lower Leg. The wound measures 0.7cm length x 2cm width x 0.1cm depth; 1.1cm^2 area and 0.11cm^3 volume. The wound is [place], [place] ([id number]) limited to skin breakdown. There is no tunneling or undermining noted. There is a none present amount of drainage noted. The wound margin is flat and intact. There is large (67-100%) red granulation within the wound bed. There is no necrotic tissue within the wound bed. The periwound skin appearance did not exhibit: Callus, Crepitus, Excoriation, Induration, Rash, Scarring, Dry/Scaly, Maceration, Atrophie Blanche, Cyanosis, Ecchymosis, Hemosiderin  Staining, Mottled, Pallor, Rubor, Erythema. Periwound temperature was noted as No Abnormality. 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 Wound #4 Right,Distal Lower Leg: May Shower, gently pat wound dry prior to applying new dressing. Primary Wound Dressing: Wound #3 Right,Proximal Lower Leg: Other: - Antibiotic ointment Wound #4 Right,Distal Lower Leg: Other: - Antibiotic ointment Secondary Dressing: Wound #3 Right,Proximal Lower Leg: Non-adherent pad - conform, stretch net Wound #4 Right,Distal Lower Leg: Non-adherent pad - conform, stretch net Dressing Change Frequency: Wound #3 Right,Proximal Lower Leg: Change dressing every day. Wound #4 Right,Distal Lower Leg: Change dressing every day. Follow-up Appointments: Wound #3 Right,Proximal Lower Leg: Return Appointment in 1 week. Wound #  4 Right,Distal Lower Leg: Return Appointment in 1 week. The following medication(s) was prescribed: mupirocin topical 2 % ointment ointment topical applied daily to the wound bed then covered with the dressing as discussed starting 06/26/2018 Jesse, STOWERS A. ([id number]) My suggestion currently after discussing this with the patient is that I my hope will be that this area heals fairly quickly. I think that Bactroban ointment would be appropriate for treating this area I considered Xeroform gauze form galls but he actually has an allergy to contrast dye which makes me worried about the  iodine in the Xeroform. Hopefully the mupirocin will be beneficial for him. We will subtly see were things stand at follow-up. If not improving significantly we may switch to different dressing but right now I feel like this may be his better option. He is in agreement the plan. Please see above for specific wound care orders. We will see patient for re-evaluation in 1 week(s) here in the clinic. If anything worsens or changes patient will contact our office for additional recommendations. Electronic Signature(s) Signed: 06/27/2018 8:18:44 AM By: Worthy Keeler PA-C Entered By: Worthy Keeler on 06/26/2018 09:48:20 [name] ([id number]) -------------------------------------------------------------------------------- ROS/PFSH Details Patient Name: Jesse Floor A. Date of Service: 06/26/2018 8:45 AM Medical Record Number: [id number] Patient Account Number: [id number] Date of Birth/Sex: 1934-05-30 (84 y.o. M) Treating RN: Montey Hora Primary Care Provider: PATIENT, NO Other Clinician: Referring Provider: Referral, Self Treating Provider/Extender: STONE III, Keyvon Herter Weeks in Treatment: 0 Information Obtained From Patient Wound History Do you currently have one or more open woundso Yes How many open wounds do you currently haveo 2 Approximately how long have you had your woundso 1 week How have you been treating your wound(s) until nowo open to air Has your wound(s) ever healed and then re-openedo No Have you had any lab work done in the past montho No Have you tested positive for an antibiotic resistant organism (MRSA, VRE)o No Have you tested positive for osteomyelitis (bone infection)o No Have you had any tests for circulation on your legso Yes Where was the test doneo [facility] Eyes Complaints and Symptoms: Negative for: Dry Eyes; Vision Changes; Glasses / Contacts Medical History: Positive for: Cataracts - removed Negative for: Glaucoma; Optic  Neuritis Ear/Nose/Mouth/Throat Complaints and Symptoms: Negative for: Difficult clearing ears; Sinusitis Medical History: Negative for: Chronic sinus problems/congestion; Middle ear problems Hematologic/Lymphatic Complaints and Symptoms: Negative for: Bleeding / Clotting Disorders; Human Immunodeficiency Virus Medical History: Negative for: Anemia; Hemophilia; Human Immunodeficiency Virus; Lymphedema; Sickle Cell Disease Respiratory Complaints and Symptoms: Negative for: Chronic or frequent coughs; Shortness of Breath Medical History: Positive for: Sleep Apnea - c-pap Negative for: Aspiration; Asthma; Chronic Obstructive Pulmonary Disease (COPD); Pneumothorax; Tuberculosis [name], [name] ([id number]) Cardiovascular Complaints and Symptoms: Negative for: Chest pain; LE edema Medical History: Positive for: Arrhythmia; Congestive Heart Failure; Hypertension; Peripheral Arterial Disease Negative for: Angina; Coronary Artery Disease; Deep Vein Thrombosis; Hypotension; Myocardial Infarction; Peripheral Venous Disease; Phlebitis; Vasculitis Gastrointestinal Complaints and Symptoms: Negative for: Frequent diarrhea; Nausea; Vomiting Medical History: Negative for: Cirrhosis ; Colitis; Crohnos; Hepatitis A; Hepatitis B; Hepatitis C Endocrine Complaints and Symptoms: Negative for: Hepatitis; Thyroid disease; Polydypsia (Excessive Thirst) Medical History: Positive for: Type II Diabetes Negative for: Type I Diabetes Time with diabetes: 30 years Treated with: Insulin, Oral agents Blood sugar tested every day: No Blood sugar testing results: Breakfast: 168 Genitourinary Complaints and Symptoms: Negative for: Kidney failure/ Dialysis; Incontinence/dribbling Medical History: Negative for: End Stage Renal Disease Immunological Complaints and Symptoms:  Negative for: Hives; Itching Medical History: Negative for: Lupus Erythematosus; Raynaudos; Scleroderma Integumentary  (Skin) Complaints and Symptoms: Positive for: Wounds Negative for: Bleeding or bruising tendency; Breakdown; Swelling Medical History: Negative for: History of Burn; History of pressure wounds Musculoskeletal [place], Daegon A. ([id number]) Complaints and Symptoms: Negative for: Muscle Pain; Muscle Weakness Medical History: Positive for: Osteoarthritis Negative for: Gout; Rheumatoid Arthritis; Osteomyelitis Past Medical History Notes: L BKA Neurologic Complaints and Symptoms: Negative for: Numbness/parasthesias; Focal/Weakness Medical History: Negative for: Dementia; Neuropathy; Quadriplegia; Paraplegia; Seizure Disorder Psychiatric Complaints and Symptoms: Negative for: Anxiety; Claustrophobia Medical History: Negative for: Anorexia/bulimia; Confinement Anxiety Constitutional Symptoms (General Health) Medical History: Past Medical History Notes: HTN; Diabetes Type II, A-Fib; Neuropathy Oncologic Medical History: Negative for: Received Chemotherapy; Received Radiation HBO Extended History Items Eyes: Cataracts Immunizations Pneumococcal Vaccine: Received Pneumococcal Vaccination: Yes Implantable Devices Family and Social History Cancer: Yes - Siblings; Diabetes: No; Heart Disease: No; Hypertension: Yes - Siblings; Kidney Disease: No; Lung Disease: No; Seizures: No; Stroke: Yes - Siblings; Thyroid Problems: No; Tuberculosis: No; Former smoker; Marital Status - Married; Alcohol Use: Never; Drug Use: No History; Caffeine Use: Daily; Advanced Directives: No; Patient does not want information on Advanced Directives; Do not resuscitate: No; Living Will: No; Medical Power of Attorney: No Electronic Signature(s) Signed: 06/26/2018 4:27:23 PM By: Montey Hora Signed: 06/27/2018 8:18:44 AM By: Worthy Keeler PA-C Entered By: Montey Hora on 06/26/2018 [place], [place] ([id number]) [place], [place]  ([id number]) -------------------------------------------------------------------------------- SuperBill Details Patient Name: Jesse Floor A. Date of Service: 06/26/2018 Medical Record Number: [id number] Patient Account Number: [id number] Date of Birth/Sex: 01-30-1935 (84 y.o. M) Treating RN: Harold Barban Primary Care Provider: PATIENT, NO Other Clinician: Referring Provider: Referral, Self Treating Provider/Extender: STONE III, Ailynn Gow Weeks in Treatment: 0 Diagnosis Coding ICD-10 Codes Code Description E11.622 Type 2 diabetes mellitus with other skin ulcer E11.51 Type 2 diabetes mellitus with diabetic peripheral angiopathy without gangrene L97.811 Non-pressure chronic ulcer of other part of right lower leg limited to breakdown of skin Z89.512 Acquired absence of left leg below knee I50.42 Chronic combined systolic (congestive) and diastolic (congestive) heart failure I10 Essential (primary) hypertension I48.0 Paroxysmal atrial fibrillation Facility Procedures CPT4 Code: [id number] Description: 99214 - WOUND CARE VISIT-LEV 4 EST PT Modifier: Quantity: 1 Physician Procedures CPT4 Code Description: [id number] - WC PHYS LEVEL 4 - EST PT ICD-10 Diagnosis Description E11.622 Type 2 diabetes mellitus with other skin ulcer E11.51 Type 2 diabetes mellitus with diabetic peripheral angiopathy w L97.811 Non-pressure chronic ulcer  of other part of right lower leg li Z89.512 Acquired absence of left leg below knee Modifier: ithout gangrene mited to breakdown Quantity: 1 of skin Electronic Signature(s) Signed: 06/27/2018 8:18:44 AM By: Worthy Keeler PA-C Entered By: Worthy Keeler on 06/26/2018 09:48:37

## 2018-06-30 NOTE — Progress Notes (Signed)
CLEVER, GERALDO ([id number]) Visit Report for 06/26/2018 Allergy List Details Patient Name: [name], [name] Date of Service: 06/26/2018 8:45 AM Medical Record Number: [id number] Patient Account Number: [id number] Date of Birth/Sex: 09/26/1934 (84 y.o. M) Treating RN: Montey Hora Primary Care Lavoris Sparling: PATIENT, NO Other Clinician: Referring Aimee Heldman: Referral, Self Treating Andie Mortimer/Extender: STONE III, HOYT Weeks in Treatment: 0 Allergies Active Allergies Iodinated Contrast- Oral and IV Dye latex Allergy Notes Electronic Signature(s) Signed: 06/26/2018 4:27:23 PM By: Montey Hora Entered By: Montey Hora on 06/26/2018 08:58:50 [name], [name] ([id number]) -------------------------------------------------------------------------------- Arrival Information Details Patient Name: Jesse Floor A. Date of Service: 06/26/2018 8:45 AM Medical Record Number: [id number] Patient Account Number: [id number] Date of Birth/Sex: January 14, 1935 (84 y.o. M) Treating RN: Montey Hora Primary Care Lowery Paullin: PATIENT, NO Other Clinician: Referring Tylie Golonka: Referral, Self Treating Yoan Sallade/Extender: STONE III, HOYT Weeks in Treatment: 0 Visit Information Patient Arrived: Wheel Chair Arrival Time: 08:53 Accompanied By: wife Transfer Assistance: None Patient Identification Verified: Yes Secondary Verification Process Yes Completed: Patient Has Alerts: Yes Patient Alerts: ABI 01/27/18 R 1.23 DMII History Since Last Visit Added or deleted any medications: No Any new allergies or adverse reactions: No Had a fall or experienced change in activities of daily living that may affect risk of falls: No Signs or symptoms of abuse/neglect since last visito No Hospitalized since last visit: No Implantable device outside of the clinic excluding cellular tissue based products placed in the center since last visit: No Electronic Signature(s) Signed: 06/26/2018 4:27:23 PM By: Montey Hora Entered By:  Montey Hora on 06/26/2018 09:10:56 [name] ([id number]) -------------------------------------------------------------------------------- Clinic Level of Care Assessment Details Patient Name: Jesse Floor A. Date of Service: 06/26/2018 8:45 AM Medical Record Number: [id number] Patient Account Number: [id number] Date of Birth/Sex: 1934-06-30 (84 y.o. M) Treating RN: Harold Barban Primary Care Collette Pescador: PATIENT, NO Other Clinician: Referring Aashritha Miedema: Referral, Self Treating Emerly Prak/Extender: STONE III, HOYT Weeks in Treatment: 0 Clinic Level of Care Assessment Items TOOL 4 Quantity Score []  - Use when only an EandM is performed on FOLLOW-UP visit 0 ASSESSMENTS - Nursing Assessment / Reassessment X - Reassessment of Co-morbidities (includes updates in patient status) 1 10 X- 1 5 Reassessment of Adherence to Treatment Plan ASSESSMENTS - Wound and Skin Assessment / Reassessment []  - Simple Wound Assessment / Reassessment - one wound 0 X- 2 5 Complex Wound Assessment / Reassessment - multiple wounds []  - 0 Dermatologic / Skin Assessment (not related to wound area) ASSESSMENTS - Focused Assessment []  - Circumferential Edema Measurements - multi extremities 0 []  - 0 Nutritional Assessment / Counseling / Intervention []  - 0 Lower Extremity Assessment (monofilament, tuning fork, pulses) []  - 0 Peripheral Arterial Disease Assessment (using hand held doppler) ASSESSMENTS - Ostomy and/or Continence Assessment and Care []  - Incontinence Assessment and Management 0 []  - 0 Ostomy Care Assessment and Management (repouching, etc.) PROCESS - Coordination of Care X - Simple Patient / Family Education for ongoing care 1 15 []  - 0 Complex (extensive) Patient / Family Education for ongoing care X- 1 10 Staff obtains Programmer, systems, Records, Test Results / Process Orders []  - 0 Staff telephones HHA, Nursing Homes / Clarify orders / etc []  - 0 Routine Transfer to another Facility  (non-emergent condition) []  - 0 Routine Hospital Admission (non-emergent condition) []  - 0 New Admissions / Biomedical engineer / Ordering NPWT, Apligraf, etc. []  - 0 Emergency Hospital Admission (emergent condition) X- 1 10 Simple Discharge Coordination [name], [name] ([id number]) []  - 0 Complex (extensive) Discharge Coordination PROCESS -  Special Needs []  - Pediatric / Minor Patient Management 0 []  - 0 Isolation Patient Management []  - 0 Hearing / Language / Visual special needs []  - 0 Assessment of Community assistance (transportation, D/C planning, etc.) []  - 0 Additional assistance / Altered mentation []  - 0 Support Surface(s) Assessment (bed, cushion, seat, etc.) INTERVENTIONS - Wound Cleansing / Measurement []  - Simple Wound Cleansing - one wound 0 X- 2 5 Complex Wound Cleansing - multiple wounds X- 1 5 Wound Imaging (photographs - any number of wounds) []  - 0 Wound Tracing (instead of photographs) []  - 0 Simple Wound Measurement - one wound X- 2 5 Complex Wound Measurement - multiple wounds INTERVENTIONS - Wound Dressings []  - Small Wound Dressing one or multiple wounds 0 X- 2 15 Medium Wound Dressing one or multiple wounds []  - 0 Large Wound Dressing one or multiple wounds []  - 0 Application of Medications - topical []  - 0 Application of Medications - injection INTERVENTIONS - Miscellaneous []  - External ear exam 0 []  - 0 Specimen Collection (cultures, biopsies, blood, body fluids, etc.) []  - 0 Specimen(s) / Culture(s) sent or taken to Lab for analysis []  - 0 Patient Transfer (multiple staff / Civil Service fast streamer / Similar devices) []  - 0 Simple Staple / Suture removal (25 or less) []  - 0 Complex Staple / Suture removal (26 or more) []  - 0 Hypo / Hyperglycemic Management (close monitor of Blood Glucose) []  - 0 Ankle / Brachial Index (ABI) - do not check if billed separately X- 1 5 Vital Signs [name], [name] ([id number]) Has the patient been  seen at the hospital within the last three years: Yes Total Score: 120 Level Of Care: New/Established - Level 4 Electronic Signature(s) Signed: 06/29/2018 11:43:02 AM By: Harold Barban Entered By: Harold Barban on 06/26/2018 09:40:55 [name] ([id number]) -------------------------------------------------------------------------------- Encounter Discharge Information Details Patient Name: Jesse Floor A. Date of Service: 06/26/2018 8:45 AM Medical Record Number: [id number] Patient Account Number: [id number] Date of Birth/Sex: Oct 16, 1934 (84 y.o. M) Treating RN: Cornell Barman Primary Care Gladys Gutman: PATIENT, NO Other Clinician: Referring Briyonna Omara: Referral, Self Treating Damir Leung/Extender: Melburn Hake, HOYT Weeks in Treatment: 0 Encounter Discharge Information Items Discharge Condition: Stable Ambulatory Status: Wheelchair Discharge Destination: Home Transportation: Private Auto Accompanied By: spouse Schedule Follow-up Appointment: Yes Clinical Summary of Care: Electronic Signature(s) Signed: 06/26/2018 5:20:52 PM By: Gretta Cool, BSN, RN, CWS, Kim RN, BSN Entered By: Gretta Cool, BSN, RN, CWS, Kim on 06/26/2018 09:51:41 [name] ([id number]) -------------------------------------------------------------------------------- Lower Extremity Assessment Details Patient Name: Jesse Floor A. Date of Service: 06/26/2018 8:45 AM Medical Record Number: [id number] Patient Account Number: [id number] Date of Birth/Sex: Oct 22, 1934 (84 y.o. M) Treating RN: Montey Hora Primary Care Shonique Pelphrey: PATIENT, NO Other Clinician: Referring Deardra Hinkley: Referral, Self Treating Zayveon Raschke/Extender: STONE III, HOYT Weeks in Treatment: 0 Edema Assessment Assessed: [Left: No] [Right: No] Edema: [Left: N] [Right: o] Vascular Assessment Pulses: Dorsalis Pedis Palpable: [Right:Yes] Posterior Tibial Palpable: [Right:Yes] Extremity colors, hair growth, and conditions: Extremity Color:  [Right:Normal] Hair Growth on Extremity: [Right:Yes] Temperature of Extremity: [Right:Warm] Capillary Refill: [Right:< 3 seconds] Toe Nail Assessment Left: Right: Thick: Yes Discolored: Yes Deformed: No Improper Length and Hygiene: No Electronic Signature(s) Signed: 06/26/2018 4:27:23 PM By: Montey Hora Entered By: Montey Hora on 06/26/2018 09:10:15 [name] ([id number]) -------------------------------------------------------------------------------- Multi Wound Chart Details Patient Name: Jesse Floor A. Date of Service: 06/26/2018 8:45 AM Medical Record Number: [id number] Patient Account Number: [id number] Date of Birth/Sex: [birth year] (84 y.o. M) Treating RN: Harold Barban Primary Care  Kynsie Falkner: PATIENT, NO Other Clinician: Referring Khadijah Mastrianni: Referral, Self Treating Naksh Radi/Extender: STONE III, HOYT Weeks in Treatment: 0 Vital Signs Height(in): 39 Pulse(bpm): 64 Weight(lbs): 180 Blood Pressure(mmHg): 136/79 Body Mass Index(BMI): 28 Temperature(F): 97.6 Respiratory Rate 16 (breaths/min): Photos: [3:No Photos] [4:No Photos] [N/A:N/A] Wound Location: [3:Right Lower Leg - Proximal] [4:Right Lower Leg - Distal] [N/A:N/A] Wounding Event: [3:Gradually Appeared] [4:Gradually Appeared] [N/A:N/A] Primary Etiology: [3:Diabetic Wound/Ulcer of the Lower Extremity] [4:Diabetic Wound/Ulcer of the Lower Extremity] [N/A:N/A] Comorbid History: [3:Cataracts, Sleep Apnea, Arrhythmia, Congestive Heart Failure, Hypertension, Peripheral Arterial Disease, Type II Diabetes, Osteoarthritis] [4:Cataracts, Sleep Apnea, Arrhythmia, Congestive Heart Failure, Hypertension, Peripheral  Arterial Disease, Type II Diabetes, Osteoarthritis] [N/A:N/A] Date Acquired: [3:06/19/2018] [4:06/19/2018] [N/A:N/A] Weeks of Treatment: [3:0] [4:0] [N/A:N/A] Wound Status: [3:Open] [4:Open] [N/A:N/A] Measurements L x W x D [3:0.4x0.5x0.1] [4:0.7x2x0.1] [N/A:N/A] (cm) Area (cm) : [3:0.157] [4:1.1]  [N/A:N/A] Volume (cm) : [3:0.016] [4:0.11] [N/A:N/A] Classification: [3:Grade 1] [4:Grade 1] [N/A:N/A] Exudate Amount: [3:None Present] [4:None Present] [N/A:N/A] Wound Margin: [3:Flat and Intact] [4:Flat and Intact] [N/A:N/A] Granulation Amount: [3:None Present (0%)] [4:Large (67-100%)] [N/A:N/A] Granulation Quality: [3:N/A] [4:Red] [N/A:N/A] Necrotic Amount: [3:Large (67-100%)] [4:None Present (0%)] [N/A:N/A] Necrotic Tissue: [3:Eschar] [4:N/A] [N/A:N/A] Exposed Structures: [3:Fascia: No Fat Layer (Subcutaneous Tissue) Exposed: No Tendon: No Muscle: No Joint: No Bone: No Limited to Skin Breakdown] [4:Fascia: No Fat Layer (Subcutaneous Tissue) Exposed: No Tendon: No Muscle: No Joint: No Bone: No Limited to Skin Breakdown]  [N/A:N/A] Epithelialization: [3:None] [4:None] [N/A:N/A] Periwound Skin Texture: [3:Excoriation: No Induration: No Callus: No] [4:Excoriation: No Induration: No Callus: No] [N/A:N/A] Crepitus: No Crepitus: No Rash: No Rash: No Scarring: No Scarring: No Periwound Skin Moisture: Maceration: No Maceration: No N/A Dry/Scaly: No Dry/Scaly: No Periwound Skin Color: Atrophie Blanche: No Atrophie Blanche: No N/A Cyanosis: No Cyanosis: No Ecchymosis: No Ecchymosis: No Erythema: No Erythema: No Hemosiderin Staining: No Hemosiderin Staining: No Mottled: No Mottled: No Pallor: No Pallor: No Rubor: No Rubor: No Temperature: No Abnormality No Abnormality N/A Tenderness on Palpation: No No N/A Wound Preparation: Ulcer Cleansing: Ulcer Cleansing: N/A Rinsed/Irrigated with Saline Rinsed/Irrigated with Saline Topical Anesthetic Applied: Topical Anesthetic Applied: Other: lidocaine 4% Other: lidocaine 4% Treatment Notes Electronic Signature(s) Signed: 06/29/2018 11:43:02 AM By: Harold Barban Entered By: Harold Barban on 06/26/2018 09:35:56 [name]  ([id number]) -------------------------------------------------------------------------------- [place] Details Patient Name: Jesse Floor A. Date of Service: 06/26/2018 8:45 AM Medical Record Number: [id number] Patient Account Number: [id number] Date of Birth/Sex: March 27, 1935 (84 y.o. M) Treating RN: Harold Barban Primary Care Hosteen Kienast: PATIENT, NO Other Clinician: Referring Delmont Prosch: Referral, Self Treating Ubah Radke/Extender: STONE III, HOYT Weeks in Treatment: 0 Active Inactive Electronic Signature(s) Signed: 06/29/2018 11:43:02 AM By: Harold Barban Entered By: Harold Barban on 06/26/2018 09:37:32 [name], [name] ([id number]) -------------------------------------------------------------------------------- Pain Assessment Details Patient Name: Jesse Floor A. Date of Service: 06/26/2018 8:45 AM Medical Record Number: [id number] Patient Account Number: [id number] Date of Birth/Sex: 07-06-1934 (84 y.o. M) Treating RN: Montey Hora Primary Care Falisha Osment: PATIENT, NO Other Clinician: Referring Tanee Henery: Referral, Self Treating Jaliza Seifried/Extender: STONE III, HOYT Weeks in Treatment: 0 Active Problems Location of Pain Severity and Description of Pain Patient Has Paino No Site Locations Pain Management and Medication Current Pain Management: Electronic Signature(s) Signed: 06/26/2018 4:27:23 PM By: Montey Hora Entered By: Montey Hora on 06/26/2018 08:55:33 [name] ([id number]) -------------------------------------------------------------------------------- Patient/Caregiver Education Details Patient Name: Jesse Floor A. Date of Service: 06/26/2018 8:45 AM Medical Record Number: [id number] Patient Account Number: [id number] Date of Birth/Gender: 1934-12-19 (84 y.o. M) Treating RN: Harold Barban Primary Care Physician:  PATIENT, NO Other Clinician: Referring Physician: Referral, Self Treating Physician/Extender: STONE III, HOYT Weeks in  Treatment: 0 Education Assessment Education Provided To: Patient Education Topics Provided Welcome To [facility]: Methods: Demonstration, Explain/Verbal Responses: State content correctly Wound/Skin Impairment: Handouts: Caring for Your Ulcer Methods: Demonstration, Explain/Verbal Responses: State content correctly Electronic Signature(s) Signed: 06/29/2018 11:43:02 AM By: Harold Barban Entered By: Harold Barban on 06/26/2018 09:36:30 [place], [name] ([id number]) -------------------------------------------------------------------------------- Wound Assessment Details Patient Name: Jesse Floor A. Date of Service: 06/26/2018 8:45 AM Medical Record Number: [id number] Patient Account Number: [id number] Date of Birth/Sex: [birth year] (84 y.o. M) Treating RN: Montey Hora Primary Care Xochil Shanker: PATIENT, NO Other Clinician: Referring Kjerstin Abrigo: Referral, Self Treating Cadance Raus/Extender: STONE III, HOYT Weeks in Treatment: 0 Wound Status Wound Number: 3 Primary Diabetic Wound/Ulcer of the Lower Extremity Etiology: Wound Location: Right Lower Leg - Proximal Wound Open Wounding Event: Gradually Appeared Status: Date Acquired: 06/19/2018 Comorbid Cataracts, Sleep Apnea, Arrhythmia, Congestive Weeks Of Treatment: 0 History: Heart Failure, Hypertension, Peripheral Arterial Clustered Wound: No Disease, Type II Diabetes, Osteoarthritis Photos Photo Uploaded By: Montey Hora on 06/26/2018 09:36:44 Wound Measurements Length: (cm) 0.4 Width: (cm) 0.5 Depth: (cm) 0.1 Area: (cm) 0.157 Volume: (cm) 0.016 % Reduction in Area: % Reduction in Volume: Epithelialization: None Tunneling: No Undermining: No Wound Description Classification: Grade 1 Wound Margin: Flat and Intact Exudate Amount: None Present Foul Odor After Cleansing: No Slough/Fibrino No Wound Bed Granulation Amount: None Present (0%) Exposed Structure Necrotic Amount: Large (67-100%) Fascia  Exposed: No Necrotic Quality: Eschar Fat Layer (Subcutaneous Tissue) Exposed: No Tendon Exposed: No Muscle Exposed: No Joint Exposed: No Bone Exposed: No Limited to Skin Breakdown Periwound Skin Texture Texture Color Medico, [name] ([id number]) No Abnormalities Noted: No No Abnormalities Noted: No Callus: No Atrophie Blanche: No Crepitus: No Cyanosis: No Excoriation: No Ecchymosis: No Induration: No Erythema: No Rash: No Hemosiderin Staining: No Scarring: No Mottled: No Pallor: No Moisture Rubor: No No Abnormalities Noted: No Dry / Scaly: No Temperature / Pain Maceration: No Temperature: No Abnormality Wound Preparation Ulcer Cleansing: Rinsed/Irrigated with Saline Topical Anesthetic Applied: Other: lidocaine 4%, Treatment Notes Wound #3 (Right, Proximal Lower Leg) 4. Dressing Applied: Other dressing (specify in notes) 5. Secondary [place] Notes mupiricin, telfa pad secured with conform and Biomedical scientist Signature(s) Signed: 06/26/2018 4:27:23 PM By: Montey Hora Entered By: Montey Hora on 06/26/2018 09:08:33 [name] ([id number]) -------------------------------------------------------------------------------- Wound Assessment Details Patient Name: Jesse Floor A. Date of Service: 06/26/2018 8:45 AM Medical Record Number: [id number] Patient Account Number: [id number] Date of Birth/Sex: 01-31-1935 (84 y.o. M) Treating RN: Montey Hora Primary Care Yeny Schmoll: PATIENT, NO Other Clinician: Referring Daphene Chisholm: Referral, Self Treating Dak Szumski/Extender: STONE III, HOYT Weeks in Treatment: 0 Wound Status Wound Number: 4 Primary Diabetic Wound/Ulcer of the Lower Extremity Etiology: Wound Location: Right Lower Leg - Distal Wound Open Wounding Event: Gradually Appeared Status: Date Acquired: 06/19/2018 Comorbid Cataracts, Sleep Apnea, Arrhythmia, Congestive Weeks Of Treatment: 0 History: Heart Failure,  Hypertension, Peripheral Arterial Clustered Wound: No Disease, Type II Diabetes, Osteoarthritis Photos Photo Uploaded By: Montey Hora on 06/26/2018 09:36:45 Wound Measurements Length: (cm) 0.7 Width: (cm) 2 Depth: (cm) 0.1 Area: (cm) 1.1 Volume: (cm) 0.11 % Reduction in Area: % Reduction in Volume: Epithelialization: None Tunneling: No Undermining: No Wound Description Classification: Grade 1 Wound Margin: Flat and Intact Exudate Amount: None Present Foul Odor After Cleansing: No Slough/Fibrino No Wound Bed Granulation Amount: Large (67-100%) Exposed Structure Granulation Quality: Red Fascia Exposed: No Necrotic Amount: None  Present (0%) Fat Layer (Subcutaneous Tissue) Exposed: No Tendon Exposed: No Muscle Exposed: No Joint Exposed: No Bone Exposed: No Limited to Skin Breakdown Periwound Skin Texture Texture Color [name], [name] ([id number]) No Abnormalities Noted: No No Abnormalities Noted: No Callus: No Atrophie Blanche: No Crepitus: No Cyanosis: No Excoriation: No Ecchymosis: No Induration: No Erythema: No Rash: No Hemosiderin Staining: No Scarring: No Mottled: No Pallor: No Moisture Rubor: No No Abnormalities Noted: No Dry / Scaly: No Temperature / Pain Maceration: No Temperature: No Abnormality Wound Preparation Ulcer Cleansing: Rinsed/Irrigated with Saline Topical Anesthetic Applied: Other: lidocaine 4%, Treatment Notes Wound #4 (Right, Distal Lower Leg) 4. Dressing Applied: Other dressing (specify in notes) 5. Secondary [place] Notes mupiricin, telfa pad secured with conform and stretchnet Electronic Signature(s) Signed: 06/26/2018 4:27:23 PM By: Montey Hora Entered By: Montey Hora on 06/26/2018 09:09:30 [name] ([id number]) -------------------------------------------------------------------------------- Vitals Details Patient Name: Jesse Floor A. Date of Service: 06/26/2018 8:45  AM Medical Record Number: [id number] Patient Account Number: [id number] Date of Birth/Sex: May 25, 1934 (84 y.o. M) Treating RN: Montey Hora Primary Care Shalah Estelle: PATIENT, NO Other Clinician: Referring Zaneta Lightcap: Referral, Self Treating Arliss Frisina/Extender: STONE III, HOYT Weeks in Treatment: 0 Vital Signs Time Taken: 08:57 Temperature (F): 97.6 Height (in): 67 Pulse (bpm): 64 Source: Measured Respiratory Rate (breaths/min): 16 Weight (lbs): 180 Blood Pressure (mmHg): 136/79 Source: Measured Reference Range: 80 - 120 mg / dl Body Mass Index (BMI): 28.2 Airway Electronic Signature(s) Signed: 06/26/2018 4:27:23 PM By: Montey Hora Entered By: Montey Hora on 06/26/2018 08:58:08

## 2018-07-03 ENCOUNTER — Encounter: Payer: Medicare Other | Admitting: Physician Assistant

## 2018-07-03 DIAGNOSIS — E11622 Type 2 diabetes mellitus with other skin ulcer: Secondary | ICD-10-CM | POA: Diagnosis not present

## 2018-07-05 NOTE — Progress Notes (Signed)
Jesse, [name] ([id number]) Visit Report for 07/03/2018 Chief Complaint Document Details Patient Name: [name], [name] Date of Service: 07/03/2018 10:45 AM Medical Record Number: [id number] Patient Account Number: [id number] Date of Birth/Sex: [birth year] (84 y.o. M) Treating RN: Cornell Barman Primary Care Provider: PATIENT, NO Other Clinician: Referring Provider: Referral, Self Treating Provider/Extender: Melburn Hake, HOYT Weeks in Treatment: 1 Information Obtained from: Patient Chief Complaint Right leg ulcers Electronic Signature(s) Signed: 07/04/2018 5:08:21 PM By: Worthy Keeler PA-C Entered By: Worthy Keeler on 07/03/2018 11:06:20 [name] ([id number]) -------------------------------------------------------------------------------- HPI Details Patient Name: Jesse Floor A. Date of Service: 07/03/2018 10:45 AM Medical Record Number: [id number] Patient Account Number: [id number] Date of Birth/Sex: [birth year] (84 y.o. M) Treating RN: Cornell Barman Primary Care Provider: PATIENT, NO Other Clinician: Referring Provider: Referral, Self Treating Provider/Extender: Melburn Hake, HOYT Weeks in Treatment: 1 History of Present Illness HPI Description: 11/29/16 patient presents today for evaluation concerning a nonhealing surgical wound following amputation of his left third through fifth toes which was performed on 10/15/16. Unfortunately following this amputation the circle side has not closed appropriately and he continues to have a significant amount of discomfort. He did have an x-ray performed on 10/21/16 which showed him being status post D3 toe amputation but otherwise no acute findings. Patient also has venous imaging of the left lower extremity which revealed no evidence of DVT on 10/22/16. His last hemoglobin A 1C was 8.5 and this was on 10/22/16 and prior to his amputation patient did have vascular surgery with Dr. Leotis Pain where he had a catheter placed in the left anterior  tibialis/dorsalis pedis artery from the right femoral approach. Percutaneous transluminal angioplasty of the left anterior tibial artery/dorsalis pedis artery was performed. This procedure apparently went very well. Unfortunately his healing just has not been what both Dr. Elvina Mattes nor himself were expecting or wanting. The question posed to [place] today is whether hyperbaric therapy would be of benefit for him. READMISSION 02/01/18 This is now a 85 year old man we had in the clinic over 2 years ago. At that point he was here for evaluation of amputations of several of his toes on his left foot with a nonhealing surgical wound. He was only here for 1 visit. The patient tells me he went on to have a left below-knee amputation slightly over a year ago. Sometime earlier this year perhaps in April he traumatized his right shin and he has had an open wound here since then. He is already seen Dr. Lucky Cowboy and vein and vascular. He had noninvasive tests that showed an ABI on the right of 1.23 but a TBI in the right of only 0.27. He had monophasic waveforms at the anterior tibial and biphasic at the posterior tibial. The overall interpretation was that although his resting right ankle brachial index is within normal limits this may be falsely elevated. Furthermore his toe brachial index was quite a bit reduced. According to the last note from vein and vascular on 01/27/18 they discussed angiogram with possible revascularization on the right. He was felt to be reasonably high risk because of his and inability inability to stay still requiring general anesthesia therefore. They told him to think about it and get back to them. He was referred here for review of this wound. As I understand things they've been using Xeroform I'm not clear what they've been putting on this although they did talk about "an ointment". 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 During his admission to hospital in July MRSA was apparently cultured from this wound and he received a course of Doxy. As he has had recent noninvasive arterial studies done on 01/27/18,no ABIs were felt to be necessary here. 02/08/18; patient arrives in with a better looking wound. Dimensions are smaller. using silver collagen 02/15/18; wound is measuring smaller. Still with adherent surface material requiring debridement. We've been using silver collagen 02/22/18; wound is measuring smaller. No debridement is required. We're using silver collagen under compression. 03/01/18; continued improvement in wound dimensions. No debridement is required. We've been using silver collagen under compression 03/08/18; wound stalled in terms of dimensions this week. Surface of the wound still looks healthy we've been using silver collagen under compression he has home health changing this. Change to Hydrofera Blue today 03/15/18; inadvertently put silver collagen on the wound today. In general his wound continues to contract. Using border foam cover 03/22/18; changed a dressing to calcium alginate. Wound on the right leg is just about closed. He continues to have irritated hyperkeratotic skin around the wound. He claims he did not have a skin lesion in this area that the wound was caused by trauma although the history is vague 03/29/18; using calcium alginate. Came in with debris over wound surface although her intake nurse noted drainage. The skin around this looks a lot better with the triamcinolone I prescribed. Jesse, [name] ([id number]) 04/05/18; used Calcium alginate. The wound is closed. This was initially traumatic it looks as though he has some degree of chronic venous insufficiency may be with stasis dermatitis. For now I have recommended simply support  stockings Readmission: 06/26/18 on evaluation today patient presents for reevaluation in our clinic concerning issue on his right medial lower extremity where it appears in my pinion that this was likely small skin tears from him scraping this part of his leg with that being said there does not appear to be any significant injury just several small. Partial thickness skin tears. Overall this is good news and that it's not too deep and hopefully should healed quite significantly faster compared to his previous admissions and what was going on at that point. There is no evidence of infection significantly although there's a little erythema around I believe this is likely more due to inflammation that it is to actually infection. Fortunately there is no sign of systemic infection. No fevers, chills, nausea, or vomiting noted at this time. 07/03/18 on evaluation today patient appears to be doing rather well in regard to his lower extremity ulcers. In fact of the five smaller areas that were open there only one of those locations still open as of today. Overall this is excellent news. Electronic Signature(s) Signed: 07/04/2018 5:08:21 PM By: Worthy Keeler PA-C Entered By: Worthy Keeler on 07/03/2018 13:20:27 [name] ([id number]) -------------------------------------------------------------------------------- Physical Exam Details Patient Name: Jesse Floor A. Date of Service: 07/03/2018 10:45 AM Medical Record Number: [id number] Patient Account Number: [id number] Date of Birth/Sex: 27-Sep-1934 (84 y.o. M) Treating RN: Cornell Barman Primary Care Provider: PATIENT, NO Other Clinician: Referring Provider: Referral, Self Treating Provider/Extender: STONE III, HOYT Weeks in Treatment: 1 Constitutional Well-nourished and well-hydrated in no acute distress. Respiratory normal breathing without difficulty. clear to auscultation bilaterally. Cardiovascular regular rate and rhythm with normal  S1, S2. Psychiatric this patient is able  to make decisions and demonstrates good insight into disease process. Alert and Oriented x 3. pleasant and cooperative. Notes Patient's wound bed currently shows signs of good granulation at this time. Fortunately again he seems to be showing signs of excellent improvement in general when it comes down to the actual wound openings. Electronic Signature(s) Signed: 07/04/2018 5:08:21 PM By: Worthy Keeler PA-C Entered By: Worthy Keeler on 07/03/2018 13:21:17 [name] ([id number]) -------------------------------------------------------------------------------- Physician Orders Details Patient Name: Jesse Floor A. Date of Service: 07/03/2018 10:45 AM Medical Record Number: [id number] Patient Account Number: [id number] Date of Birth/Sex: [birth year] (84 y.o. M) Treating RN: Cornell Barman Primary Care Provider: PATIENT, NO Other Clinician: Referring Provider: Referral, Self Treating Provider/Extender: Melburn Hake, HOYT Weeks in Treatment: 1 Verbal / Phone Orders: No Diagnosis Coding ICD-10 Coding Code Description E11.622 Type 2 diabetes mellitus with other skin ulcer E11.51 Type 2 diabetes mellitus with diabetic peripheral angiopathy without gangrene L97.811 Non-pressure chronic ulcer of other part of right lower leg limited to breakdown of skin Z89.512 Acquired absence of left leg below knee I50.42 Chronic combined systolic (congestive) and diastolic (congestive) heart failure I10 Essential (primary) hypertension I48.0 Paroxysmal atrial fibrillation Wound Cleansing Wound #4 Right,Distal Lower Leg o May Shower, gently pat wound dry prior to applying new dressing. Primary Wound Dressing Wound #4 Right,Distal Lower Leg o Other: - Antibiotic ointment Secondary Dressing Wound #4 Right,Distal Lower Leg o Non-adherent pad - conform, stretch net Dressing Change Frequency Wound #4 Right,Distal Lower Leg o Change dressing every  day. Follow-up Appointments Wound #4 Right,Distal Lower Leg o Return Appointment in 1 week. Electronic Signature(s) Signed: 07/04/2018 11:53:49 AM By: Gretta Cool, BSN, RN, CWS, Kim RN, BSN Signed: 07/04/2018 5:08:21 PM By: Worthy Keeler PA-C Entered By: Gretta Cool BSN, RN, CWS, Kim on 07/03/2018 11:22:11 [name] ([id number]) -------------------------------------------------------------------------------- Problem List Details Patient Name: Jesse Floor A. Date of Service: 07/03/2018 10:45 AM Medical Record Number: [id number] Patient Account Number: [id number] Date of Birth/Sex: 19-Jul-1934 (84 y.o. M) Treating RN: Cornell Barman Primary Care Provider: PATIENT, NO Other Clinician: Referring Provider: Referral, Self Treating Provider/Extender: Melburn Hake, HOYT Weeks in Treatment: 1 Active Problems ICD-10 Evaluated Encounter Code Description Active Date Today Diagnosis E11.622 Type 2 diabetes mellitus with other skin ulcer 06/26/2018 No Yes E11.51 Type 2 diabetes mellitus with diabetic peripheral angiopathy 06/26/2018 No Yes without gangrene L97.811 Non-pressure chronic ulcer of other part of right lower leg 06/26/2018 No Yes limited to breakdown of skin Z89.512 Acquired absence of left leg below knee 06/26/2018 No Yes I50.42 Chronic combined systolic (congestive) and diastolic 07/27/6292 No Yes (congestive) heart failure I10 Essential (primary) hypertension 06/26/2018 No Yes I48.0 Paroxysmal atrial fibrillation 06/26/2018 No Yes Inactive Problems Resolved Problems Electronic Signature(s) Signed: 07/04/2018 5:08:21 PM By: Worthy Keeler PA-C Entered By: Worthy Keeler on 07/03/2018 11:06:15 [name] ([id number]) -------------------------------------------------------------------------------- Progress Note Details Patient Name: Jesse Floor A. Date of Service: 07/03/2018 10:45 AM Medical Record Number: [id number] Patient Account Number: [id number] Date of Birth/Sex: 1934-11-03 (84  y.o. M) Treating RN: Cornell Barman Primary Care Provider: PATIENT, NO Other Clinician: Referring Provider: Referral, Self Treating Provider/Extender: Melburn Hake, HOYT Weeks in Treatment: 1 Subjective Chief Complaint Information obtained from Patient Right leg ulcers History of Present Illness (HPI) 11/29/16 patient presents today for evaluation concerning a nonhealing surgical wound following amputation of his left third through fifth toes which was performed on 10/15/16. Unfortunately following this amputation the circle side has not closed appropriately and he continues to have a  significant amount of discomfort. He did have an x-ray performed on 10/21/16 which showed him being status post D3 toe amputation but otherwise no acute findings. Patient also has venous imaging of the left lower extremity which revealed no evidence of DVT on 10/22/16. His last hemoglobin A 1C was 8.5 and this was on 10/22/16 and prior to his amputation patient did have vascular surgery with Dr. Leotis Pain where he had a catheter placed in the left anterior tibialis/dorsalis pedis artery from the right femoral approach. Percutaneous transluminal angioplasty of the left anterior tibial artery/dorsalis pedis artery was performed. This procedure apparently went very well. Unfortunately his healing just has not been what both Dr. Elvina Mattes nor himself were expecting or wanting. The question posed to [place] today is whether hyperbaric therapy would be of benefit for him. READMISSION 02/01/18 This is now a 84 year old man we had in the clinic over 2 years ago. At that point he was here for evaluation of amputations of several of his toes on his left foot with a nonhealing surgical wound. He was only here for 1 visit. The patient tells me he went on to have a left below-knee amputation slightly over a year ago. Sometime earlier this year perhaps in April he traumatized his right shin and he has had an open wound here since then. He is  already seen Dr. Lucky Cowboy and vein and vascular. He had noninvasive tests that showed an ABI on the right of 1.23 but a TBI in the right of only 0.27. He had monophasic waveforms at the anterior tibial and biphasic at the posterior tibial. The overall interpretation was that although his resting right ankle brachial index is within normal limits this may be falsely elevated. Furthermore his toe brachial index was quite a bit reduced. According to the last note from vein and vascular on 01/27/18 they discussed angiogram with possible revascularization on the right. He was felt to be reasonably high risk because of his and inability inability to stay still requiring general anesthesia therefore. They told him to think about it and get back to them. He was referred here for review of this wound. As I understand things they've been using Xeroform I'm not clear what they've been putting on this although they did talk about "an ointment". 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 During his admission to hospital in July MRSA was apparently cultured from this wound and he received a course of Doxy. As he has had recent noninvasive arterial studies done on 01/27/18,no ABIs were felt to be necessary here. 02/08/18; patient arrives in with a better looking wound. Dimensions are smaller. using silver collagen 02/15/18; wound is measuring smaller. Still with adherent surface material requiring debridement. We've been using silver collagen 02/22/18; wound is measuring smaller. No debridement is required. We're using silver collagen under compression. 03/01/18; continued improvement in wound dimensions. No debridement is required. We've been using silver collagen under compression 03/08/18; wound stalled in terms of  dimensions this week. Surface of the wound still looks healthy we've been using silver collagen under compression he has home health changing this. Change to Hydrofera Blue today 03/15/18; inadvertently put silver collagen on the wound today. In general his wound continues to contract. Using border foam Widen, Zayyan A. ([id number]) cover 03/22/18; changed a dressing  to calcium alginate. Wound on the right leg is just about closed. He continues to have irritated hyperkeratotic skin around the wound. He claims he did not have a skin lesion in this area that the wound was caused by trauma although the history is vague 03/29/18; using calcium alginate. Came in with debris over wound surface although her intake nurse noted drainage. The skin around this looks a lot better with the triamcinolone I prescribed. 04/05/18; used Calcium alginate. The wound is closed. This was initially traumatic it looks as though he has some degree of chronic venous insufficiency may be with stasis dermatitis. For now I have recommended simply support stockings Readmission: 06/26/18 on evaluation today patient presents for reevaluation in our clinic concerning issue on his right medial lower extremity where it appears in my pinion that this was likely small skin tears from him scraping this part of his leg with that being said there does not appear to be any significant injury just several small. Partial thickness skin tears. Overall this is good news and that it's not too deep and hopefully should healed quite significantly faster compared to his previous admissions and what was going on at that point. There is no evidence of infection significantly although there's a little erythema around I believe this is likely more due to inflammation that it is to actually infection. Fortunately there is no sign of systemic infection. No fevers, chills, nausea, or vomiting noted at this time. 07/03/18 on evaluation today patient  appears to be doing rather well in regard to his lower extremity ulcers. In fact of the five smaller areas that were open there only one of those locations still open as of today. Overall this is excellent news. Patient History Information obtained from Patient. Family History Cancer - Siblings, Hypertension - Siblings, Stroke - Siblings, No family history of Diabetes, Heart Disease, Kidney Disease, Lung Disease, Seizures, Thyroid Problems, Tuberculosis. Social History Former smoker, Marital Status - Married, Alcohol Use - Never, Drug Use - No History, Caffeine Use - Daily. Medical History Eyes Patient has history of Cataracts - removed Denies history of Glaucoma, Optic Neuritis Ear/Nose/Mouth/Throat Denies history of Chronic sinus problems/congestion, Middle ear problems Hematologic/Lymphatic Denies history of Anemia, Hemophilia, Human Immunodeficiency Virus, Lymphedema, Sickle Cell Disease Respiratory Patient has history of Sleep Apnea - c-pap Denies history of Aspiration, Asthma, Chronic Obstructive Pulmonary Disease (COPD), Pneumothorax, Tuberculosis Cardiovascular Patient has history of Arrhythmia, Congestive Heart Failure, Hypertension, Peripheral Arterial Disease Denies history of Angina, Coronary Artery Disease, Deep Vein Thrombosis, Hypotension, Myocardial Infarction, Peripheral Venous Disease, Phlebitis, Vasculitis Gastrointestinal Denies history of Cirrhosis , Colitis, Crohn s, Hepatitis A, Hepatitis B, Hepatitis C Endocrine Patient has history of Type II Diabetes Denies history of Type I Diabetes Genitourinary Denies history of End Stage Renal Disease Immunological Denies history of Lupus Erythematosus, Raynaud s, Scleroderma Integumentary (Skin) Denies history of History of Burn, History of pressure wounds Musculoskeletal Werst, Ry A. ([id number]) Patient has history of Osteoarthritis Denies history of Gout, Rheumatoid Arthritis,  Osteomyelitis Neurologic Denies history of Dementia, Neuropathy, Quadriplegia, Paraplegia, Seizure Disorder Oncologic Denies history of Received Chemotherapy, Received Radiation Psychiatric Denies history of Anorexia/bulimia, Confinement Anxiety Medical And Surgical History Notes Constitutional Symptoms (General Health) HTN; Diabetes Type II, A-Fib; Neuropathy Musculoskeletal L BKA Review of Systems (ROS) Constitutional Symptoms (General Health) Denies complaints or symptoms of Fever, Chills. Respiratory The patient has no complaints or symptoms. Cardiovascular The patient has no complaints or symptoms. Psychiatric The patient has no complaints or symptoms. Objective Constitutional  Well-nourished and well-hydrated in no acute distress. Vitals Time Taken: 10:43 AM, Height: 67 in, Weight: 180 lbs, BMI: 28.2, Temperature: 97.9 F, Pulse: 104 bpm, Respiratory Rate: 16 breaths/min, Blood Pressure: 129/72 mmHg. Respiratory normal breathing without difficulty. clear to auscultation bilaterally. Cardiovascular regular rate and rhythm with normal S1, S2. Psychiatric this patient is able to make decisions and demonstrates good insight into disease process. Alert and Oriented x 3. pleasant and cooperative. General Notes: Patient's wound bed currently shows signs of good granulation at this time. Fortunately again he seems to be showing signs of excellent improvement in general when it comes down to the actual wound openings. Integumentary (Hair, Skin) Wound #3 status is Healed - Epithelialized. Original cause of wound was Gradually Appeared. The wound is located on the Right,Proximal Lower Leg. The wound measures 0cm length x 0cm width x 0cm depth; 0cm^2 area and 0cm^3 volume. The [place] ([id number]) wound is limited to skin breakdown. There is no tunneling or undermining noted. There is a none present amount of drainage noted. The wound margin is flat and intact. There is no  granulation within the wound bed. There is a large (67-100%) amount of necrotic tissue within the wound bed including Eschar. The periwound skin appearance did not exhibit: Callus, Crepitus, Excoriation, Induration, Rash, Scarring, Dry/Scaly, Maceration, Atrophie Blanche, Cyanosis, Ecchymosis, Hemosiderin Staining, Mottled, Pallor, Rubor, Erythema. Periwound temperature was noted as No Abnormality. Wound #4 status is Open. Original cause of wound was Gradually Appeared. The wound is located on the Right,Distal Lower Leg. The wound measures 0.3cm length x 0.2cm width x 0.1cm depth; 0.047cm^2 area and 0.005cm^3 volume. The wound is limited to skin breakdown. There is no tunneling or undermining noted. There is a none present amount of drainage noted. The wound margin is flat and intact. There is no granulation within the wound bed. There is no necrotic tissue within the wound bed. The periwound skin appearance did not exhibit: Callus, Crepitus, Excoriation, Induration, Rash, Scarring, Dry/Scaly, Maceration, Atrophie Blanche, Cyanosis, Ecchymosis, Hemosiderin Staining, Mottled, Pallor, Rubor, Erythema. Periwound temperature was noted as No Abnormality. Assessment Active Problems ICD-10 Type 2 diabetes mellitus with other skin ulcer Type 2 diabetes mellitus with diabetic peripheral angiopathy without gangrene Non-pressure chronic ulcer of other part of right lower leg limited to breakdown of skin Acquired absence of left leg below knee Chronic combined systolic (congestive) and diastolic (congestive) heart failure Essential (primary) hypertension Paroxysmal atrial fibrillation Plan Wound Cleansing: Wound #4 Right,Distal Lower Leg: May Shower, gently pat wound dry prior to applying new dressing. Primary Wound Dressing: Wound #4 Right,Distal Lower Leg: Other: - Antibiotic ointment Secondary Dressing: Wound #4 Right,Distal Lower Leg: Non-adherent pad - conform, stretch net Dressing Change  Frequency: Wound #4 Right,Distal Lower Leg: Change dressing every day. Follow-up Appointments: Wound #4 Right,Distal Lower Leg: Return Appointment in 1 week. My suggestion at this point is gonna be that we go ahead and proceed with the Current wound care measures which seem to be doing excellent for the patient at this time. He's in agreement the plan. We will subsequently see were things stand at [facility]. ([id number]) follow-up. If anything changes or worsens meantime he will contact the office and let me know otherwise I hope that he will actually be completely healed as of next week. Please see above for specific wound care orders. We will see patient for re-evaluation in 1 week(s) here in the clinic. If anything worsens or changes patient will contact our office for additional recommendations.  Electronic Signature(s) Signed: 07/04/2018 5:08:21 PM By: Worthy Keeler PA-C Entered By: Worthy Keeler on 07/03/2018 13:21:51 [name] ([id number]) -------------------------------------------------------------------------------- ROS/PFSH Details Patient Name: Jesse Floor A. Date of Service: 07/03/2018 10:45 AM Medical Record Number: [id number] Patient Account Number: [id number] Date of Birth/Sex: 1935-01-05 (84 y.o. M) Treating RN: Cornell Barman Primary Care Provider: PATIENT, NO Other Clinician: Referring Provider: Referral, Self Treating Provider/Extender: STONE III, HOYT Weeks in Treatment: 1 Information Obtained From Patient Wound History Do you currently have one or more open woundso Yes How many open wounds do you currently haveo 2 Approximately how long have you had your woundso 1 week How have you been treating your wound(s) until nowo open to air Has your wound(s) ever healed and then re-openedo No Have you had any lab work done in the past montho No Have you tested positive for an antibiotic resistant organism (MRSA, VRE)o No Have you tested positive for  osteomyelitis (bone infection)o No Have you had any tests for circulation on your legso Yes Where was the test doneo [facility] Constitutional Symptoms (General Health) Complaints and Symptoms: Negative for: Fever; Chills Medical History: Past Medical History Notes: HTN; Diabetes Type II, A-Fib; Neuropathy Eyes Medical History: Positive for: Cataracts - removed Negative for: Glaucoma; Optic Neuritis Ear/Nose/Mouth/Throat Medical History: Negative for: Chronic sinus problems/congestion; Middle ear problems Hematologic/Lymphatic Medical History: Negative for: Anemia; Hemophilia; Human Immunodeficiency Virus; Lymphedema; Sickle Cell Disease Respiratory Complaints and Symptoms: No Complaints or Symptoms Medical History: Positive for: Sleep Apnea - c-pap Negative for: Aspiration; Asthma; Chronic Obstructive Pulmonary Disease (COPD); Pneumothorax; Tuberculosis Zwiebel, Terion A. ([id number]) Cardiovascular Complaints and Symptoms: No Complaints or Symptoms Medical History: Positive for: Arrhythmia; Congestive Heart Failure; Hypertension; Peripheral Arterial Disease Negative for: Angina; Coronary Artery Disease; Deep Vein Thrombosis; Hypotension; Myocardial Infarction; Peripheral Venous Disease; Phlebitis; Vasculitis Gastrointestinal Medical History: Negative for: Cirrhosis ; Colitis; Crohnos; Hepatitis A; Hepatitis B; Hepatitis C Endocrine Medical History: Positive for: Type II Diabetes Negative for: Type I Diabetes Time with diabetes: 30 years Treated with: Insulin, Oral agents Blood sugar tested every day: No Blood sugar testing results: Breakfast: 168 Genitourinary Medical History: Negative for: End Stage Renal Disease Immunological Medical History: Negative for: Lupus Erythematosus; Raynaudos; Scleroderma Integumentary (Skin) Medical History: Negative for: History of Burn; History of pressure wounds Musculoskeletal Medical History: Positive for: Osteoarthritis Negative  for: Gout; Rheumatoid Arthritis; Osteomyelitis Past Medical History Notes: L BKA Neurologic Medical History: Negative for: Dementia; Neuropathy; Quadriplegia; Paraplegia; Seizure Disorder Oncologic Medical History: Negative for: Received Chemotherapy; Received Radiation JADE, BURRIGHT ([id number]) Psychiatric Complaints and Symptoms: No Complaints or Symptoms Medical History: Negative for: Anorexia/bulimia; Confinement Anxiety HBO Extended History Items Eyes: Cataracts Immunizations Pneumococcal Vaccine: Received Pneumococcal Vaccination: Yes Implantable Devices Family and Social History Cancer: Yes - Siblings; Diabetes: No; Heart Disease: No; Hypertension: Yes - Siblings; Kidney Disease: No; Lung Disease: No; Seizures: No; Stroke: Yes - Siblings; Thyroid Problems: No; Tuberculosis: No; Former smoker; Marital Status - Married; Alcohol Use: Never; Drug Use: No History; Caffeine Use: Daily; Advanced Directives: No; Patient does not want information on Advanced Directives; Do not resuscitate: No; Living Will: No; Medical Power of Attorney: No Physician Affirmation I have reviewed and agree with the above information. Electronic Signature(s) Signed: 07/04/2018 11:53:49 AM By: Gretta Cool, BSN, RN, CWS, Kim RN, BSN Signed: 07/04/2018 5:08:21 PM By: Worthy Keeler PA-C Entered By: Worthy Keeler on 07/03/2018 13:20:58 [name] ([id number]) -------------------------------------------------------------------------------- SuperBill Details Patient Name: Jesse Floor A. Date of Service: 07/03/2018 Medical Record Number:  [id number] Patient Account Number: [id number] Date of Birth/Sex: September 12, 1934 (84 y.o. 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 diastolic (congestive) heart failure I10 Essential (primary) hypertension I48.0 Paroxysmal atrial fibrillation Facility Procedures CPT4 Code: [id number] Description: 31497 - WOUND CARE VISIT-LEV 2 EST PT Modifier: Quantity: 1 Physician Procedures CPT4 Code Description: [id number] - WC PHYS LEVEL 4 - EST PT ICD-10 Diagnosis Description E11.622 Type 2 diabetes mellitus with other skin ulcer E11.51 Type 2 diabetes mellitus with diabetic peripheral angiopathy w L97.811 Non-pressure chronic ulcer  of other part of right lower leg li Z89.512 Acquired absence of left leg below knee Modifier: ithout gangrene mited to breakdown Quantity: 1 of skin Electronic Signature(s) Signed: 07/04/2018 5:08:21 PM By: Worthy Keeler PA-C Entered By: Worthy Keeler on 07/03/2018 13:22:04

## 2018-07-05 NOTE — Progress Notes (Signed)
RHYDER, KOEGEL ([id number]) Visit Report for 07/03/2018 Arrival Information Details Patient Name: [name], [name] Date of Service: 07/03/2018 10:45 AM Medical Record Number: [id number] Patient Account Number: [id number] Date of Birth/Sex: [birth year] (84 y.o. M) Treating RN: Cornell Barman Primary Care Chenay Nesmith: PATIENT, NO Other Clinician: Referring Bruin Bolger: Referral, Self Treating Avonna Iribe/Extender: STONE III, HOYT Weeks in Treatment: 1 Visit Information History Since Last Visit Added or deleted any medications: No Patient Arrived: Wheel Chair Any new allergies or adverse reactions: No Arrival Time: 10:42 Had a fall or experienced change in No Accompanied By: wife activities of daily living that may affect Transfer Assistance: None risk of falls: Patient Identification Verified: Yes Signs or symptoms of abuse/neglect since last visito No Secondary Verification Process Yes Hospitalized since last visit: No Completed: Implantable device outside of the clinic excluding No Patient Has Alerts: Yes cellular tissue based products placed in the center Patient Alerts: ABI 01/27/18 R since last visit: 1.23 Has Dressing in Place as Prescribed: Yes DMII Pain Present Now: No Electronic Signature(s) Signed: 07/03/2018 4:24:19 PM By: Lorine Bears RCP, RRT, CHT Entered By: Lorine Bears on 07/03/2018 10:42:55 [name] ([id number]) -------------------------------------------------------------------------------- Clinic Level of Care Assessment Details Patient Name: Jesse Floor A. Date of Service: 07/03/2018 10:45 AM Medical Record Number: [id number] Patient Account Number: [id number] Date of Birth/Sex: 1934/10/08 (84 y.o. M) Treating RN: Cornell Barman Primary Care Masato Pettie: PATIENT, NO Other Clinician: Referring Nicklaus Alviar: Referral, Self Treating Christeen Lai/Extender: STONE III, HOYT Weeks in Treatment: 1 Clinic Level of Care Assessment Items TOOL 4  Quantity Score []  - Use when only an EandM is performed on FOLLOW-UP visit 0 ASSESSMENTS - Nursing Assessment / Reassessment []  - Reassessment of Co-morbidities (includes updates in patient status) 0 X- 1 5 Reassessment of Adherence to Treatment Plan ASSESSMENTS - Wound and Skin Assessment / Reassessment X - Simple Wound Assessment / Reassessment - one wound 1 5 []  - 0 Complex Wound Assessment / Reassessment - multiple wounds []  - 0 Dermatologic / Skin Assessment (not related to wound area) ASSESSMENTS - Focused Assessment []  - Circumferential Edema Measurements - multi extremities 0 []  - 0 Nutritional Assessment / Counseling / Intervention []  - 0 Lower Extremity Assessment (monofilament, tuning fork, pulses) []  - 0 Peripheral Arterial Disease Assessment (using hand held doppler) ASSESSMENTS - Ostomy and/or Continence Assessment and Care []  - Incontinence Assessment and Management 0 []  - 0 Ostomy Care Assessment and Management (repouching, etc.) PROCESS - Coordination of Care X - Simple Patient / Family Education for ongoing care 1 15 []  - 0 Complex (extensive) Patient / Family Education for ongoing care []  - 0 Staff obtains Programmer, systems, Records, Test Results / Process Orders []  - 0 Staff telephones HHA, Nursing Homes / Clarify orders / etc []  - 0 Routine Transfer to another Facility (non-emergent condition) []  - 0 Routine Hospital Admission (non-emergent condition) []  - 0 New Admissions / Biomedical engineer / Ordering NPWT, Apligraf, etc. []  - 0 Emergency Hospital Admission (emergent condition) X- 1 10 Simple Discharge Coordination [name], [name] ([id number]) []  - 0 Complex (extensive) Discharge Coordination PROCESS - Special Needs []  - Pediatric / Minor Patient Management 0 []  - 0 Isolation Patient Management []  - 0 Hearing / Language / Visual special needs []  - 0 Assessment of Community assistance (transportation, D/C planning, etc.) []  - 0 Additional  assistance / Altered mentation []  - 0 Support Surface(s) Assessment (bed, cushion, seat, etc.) INTERVENTIONS - Wound Cleansing / Measurement X - Simple Wound Cleansing - one wound  1 5 []  - 0 Complex Wound Cleansing - multiple wounds X- 1 5 Wound Imaging (photographs - any number of wounds) []  - 0 Wound Tracing (instead of photographs) []  - 0 Simple Wound Measurement - one wound []  - 0 Complex Wound Measurement - multiple wounds INTERVENTIONS - Wound Dressings X - Small Wound Dressing one or multiple wounds 1 10 []  - 0 Medium Wound Dressing one or multiple wounds []  - 0 Large Wound Dressing one or multiple wounds []  - 0 Application of Medications - topical []  - 0 Application of Medications - injection INTERVENTIONS - Miscellaneous []  - External ear exam 0 []  - 0 Specimen Collection (cultures, biopsies, blood, body fluids, etc.) []  - 0 Specimen(s) / Culture(s) sent or taken to Lab for analysis []  - 0 Patient Transfer (multiple staff / Civil Service fast streamer / Similar devices) []  - 0 Simple Staple / Suture removal (25 or less) []  - 0 Complex Staple / Suture removal (26 or more) []  - 0 Hypo / Hyperglycemic Management (close monitor of Blood Glucose) []  - 0 Ankle / Brachial Index (ABI) - do not check if billed separately X- 1 5 Vital Signs [name], [name] ([id number]) Has the patient been seen at the hospital within the last three years: Yes Total Score: 60 Level Of Care: New/Established - Level 2 Electronic Signature(s) Signed: 07/04/2018 11:53:49 AM By: Gretta Cool, BSN, RN, CWS, Kim RN, BSN Entered By: Gretta Cool, BSN, RN, CWS, Kim on 07/03/2018 11:22:33 [name] ([id number]) -------------------------------------------------------------------------------- Lower Extremity Assessment Details Patient Name: Jesse Floor A. Date of Service: 07/03/2018 10:45 AM Medical Record Number: [id number] Patient Account Number: [id number] Date of Birth/Sex: 1934/10/19 (84 y.o. M) Treating  RN: Secundino Ginger Primary Care Marquavion Venhuizen: PATIENT, NO Other Clinician: Referring Sharan Mcenaney: Referral, Self Treating Geet Hosking/Extender: STONE III, HOYT Weeks in Treatment: 1 Edema Assessment Assessed: [Left: No] [Right: No] [Left: Edema] [Right: :] Calf Left: Right: Point of Measurement: 32 cm From Medial Instep cm 35 cm Ankle Left: Right: Point of Measurement: 14 cm From Medial Instep cm 20 cm Vascular Assessment Claudication: Claudication Assessment [Right:None] Pulses: Dorsalis Pedis Palpable: [Right:Yes] Posterior Tibial Extremity colors, hair growth, and conditions: Extremity Color: [Right:Normal] Hair Growth on Extremity: [Right:No] Temperature of Extremity: [Right:Cool] Capillary Refill: [Right:< 3 seconds] Toe Nail Assessment Left: Right: Thick: Yes Discolored: Yes Deformed: No Improper Length and Hygiene: No Electronic Signature(s) Signed: 07/03/2018 11:33:08 AM By: Secundino Ginger Entered By: Secundino Ginger on 07/03/2018 11:09:47 Shirk, Modena [name] ([id number]) -------------------------------------------------------------------------------- Multi Wound Chart Details Patient Name: Jesse Floor A. Date of Service: 07/03/2018 10:45 AM Medical Record Number: [id number] Patient Account Number: [id number] Date of Birth/Sex: Sep 05, 1934 (84 y.o. M) Treating RN: Cornell Barman Primary Care Aviya Jarvie: PATIENT, NO Other Clinician: Referring Ilyssa Grennan: Referral, Self Treating Ketura Sirek/Extender: STONE III, HOYT Weeks in Treatment: 1 Vital Signs Height(in): 37 Pulse(bpm): 104 Weight(lbs): 180 Blood Pressure(mmHg): 129/72 Body Mass Index(BMI): 28 Temperature(F): 97.9 Respiratory Rate 16 (breaths/min): Photos: [3:No Photos] [4:No Photos] [N/A:N/A] Wound Location: [3:Right, Proximal Lower Leg] [4:Right Lower Leg - Distal] [N/A:N/A] Wounding Event: [3:Gradually Appeared] [4:Gradually Appeared] [N/A:N/A] Primary Etiology: [3:Diabetic Wound/Ulcer of the Lower Extremity] [4:Diabetic  Wound/Ulcer of the Lower Extremity] [N/A:N/A] Comorbid History: [3:Cataracts, Sleep Apnea, Arrhythmia, Congestive Heart Failure, Hypertension, Peripheral Arterial Disease, Type II Diabetes, Osteoarthritis] [4:Cataracts, Sleep Apnea, Arrhythmia, Congestive Heart Failure, Hypertension, Peripheral  Arterial Disease, Type II Diabetes, Osteoarthritis] [N/A:N/A] Date Acquired: [3:06/19/2018] [4:06/19/2018] [N/A:N/A] Weeks of Treatment: [3:1] [4:1] [N/A:N/A] Wound Status: [3:Healed - Epithelialized] [4:Open] [N/A:N/A] Measurements L x W x D [3:0x0x0] [4:0.3x0.2x0.1] [N/A:N/A] (  cm) Area (cm) : [3:0] [4:0.047] [N/A:N/A] Volume (cm) : [3:0] [4:0.005] [N/A:N/A] % Reduction in Area: [3:100.00%] [4:95.70%] [N/A:N/A] % Reduction in Volume: [3:100.00%] [4:95.50%] [N/A:N/A] Classification: [3:Grade 1] [4:Grade 1] [N/A:N/A] Exudate Amount: [3:None Present] [4:None Present] [N/A:N/A] Wound Margin: [3:Flat and Intact] [4:Flat and Intact] [N/A:N/A] Granulation Amount: [3:None Present (0%)] [4:None Present (0%)] [N/A:N/A] Necrotic Amount: [3:Large (67-100%)] [4:None Present (0%)] [N/A:N/A] Necrotic Tissue: [3:Eschar] [4:N/A] [N/A:N/A] Exposed Structures: [3:Fascia: No Fat Layer (Subcutaneous Tissue) Exposed: No Tendon: No Muscle: No Joint: No Bone: No Limited to Skin Breakdown] [4:Fascia: No Fat Layer (Subcutaneous Tissue) Exposed: No Tendon: No Muscle: No Joint: No Bone: No Limited to Skin Breakdown]  [N/A:N/A] Epithelialization: [3:None] [4:None] [N/A:N/A] Periwound Skin Texture: [3:Excoriation: No Induration: No] [4:Excoriation: No Induration: No] [N/A:N/A] Callus: No Callus: No Crepitus: No Crepitus: No Rash: No Rash: No Scarring: No Scarring: No Periwound Skin Moisture: Maceration: No Maceration: No N/A Dry/Scaly: No Dry/Scaly: No Periwound Skin Color: Atrophie Blanche: No Atrophie Blanche: No N/A Cyanosis: No Cyanosis: No Ecchymosis: No Ecchymosis: No Erythema: No Erythema:  No Hemosiderin Staining: No Hemosiderin Staining: No Mottled: No Mottled: No Pallor: No Pallor: No Rubor: No Rubor: No Temperature: No Abnormality No Abnormality N/A Tenderness on Palpation: No No N/A Wound Preparation: Ulcer Cleansing: Ulcer Cleansing: N/A Rinsed/Irrigated with Saline Rinsed/Irrigated with Saline Topical Anesthetic Applied: Topical Anesthetic Applied: Other: lidocaine 4% Other: lidocaine 4% Treatment Notes Electronic Signature(s) Signed: 07/04/2018 11:53:49 AM By: Gretta Cool, BSN, RN, CWS, Kim RN, BSN Entered By: Gretta Cool, BSN, RN, CWS, Kim on 07/03/2018 11:21:47 [name] ([id number]) -------------------------------------------------------------------------------- Multi-Disciplinary Care Plan Details Patient Name: Jesse Floor A. Date of Service: 07/03/2018 10:45 AM Medical Record Number: [id number] Patient Account Number: [id number] Date of Birth/Sex: 1934-07-23 (84 y.o. M) Treating RN: Cornell Barman Primary Care Nayden Czajka: PATIENT, NO Other Clinician: Referring Seferina Brokaw: Referral, Self Treating Porchia Sinkler/Extender: STONE III, HOYT Weeks in Treatment: 1 Active Inactive Wound/Skin Impairment Nursing Diagnoses: Impaired tissue integrity Goals: Ulcer/skin breakdown will heal within 14 weeks Date Initiated: 07/03/2018 Target Resolution Date: 07/28/2018 Goal Status: Active Interventions: Assess ulceration(s) every visit Treatment Activities: Topical wound management initiated : 07/03/2018 Notes: Electronic Signature(s) Signed: 07/04/2018 11:53:49 AM By: Gretta Cool, BSN, RN, CWS, Kim RN, BSN Entered By: Gretta Cool, BSN, RN, CWS, Kim on 07/03/2018 11:21:40 [name] ([id number]) -------------------------------------------------------------------------------- Pain Assessment Details Patient Name: Jesse Floor A. Date of Service: 07/03/2018 10:45 AM Medical Record Number: [id number] Patient Account Number: [id number] Date of Birth/Sex: 07-May-1935 (84 y.o.  M) Treating RN: Cornell Barman Primary Care Tamitha Norell: PATIENT, NO Other Clinician: Referring Ladavion Savitz: Referral, Self Treating Doneen Ollinger/Extender: STONE III, HOYT Weeks in Treatment: 1 Active Problems Location of Pain Severity and Description of Pain Patient Has Paino No Site Locations Pain Management and Medication Current Pain Management: Electronic Signature(s) Signed: 07/03/2018 4:24:19 PM By: Lorine Bears RCP, RRT, CHT Signed: 07/04/2018 11:53:49 AM By: Gretta Cool, BSN, RN, CWS, Kim RN, BSN Entered By: Lorine Bears on 07/03/2018 10:43:01 [name] ([id number]) -------------------------------------------------------------------------------- Patient/Caregiver Education Details Patient Name: Jesse Floor A. Date of Service: 07/03/2018 10:45 AM Medical Record Number: [id number] Patient Account Number: [id number] Date of Birth/Gender: November 20, 1934 (84 y.o. M) Treating RN: Cornell Barman Primary Care Physician: PATIENT, NO Other Clinician: Referring Physician: Referral, Self Treating Physician/Extender: Melburn Hake, HOYT Weeks in Treatment: 1 Education Assessment Education Provided To: Patient Education Topics Provided Wound/Skin Impairment: Handouts: Caring for Your Ulcer, Other: Continue wound care as prescribed Methods: Demonstration, Explain/Verbal Responses: State content correctly Electronic Signature(s) Signed: 07/04/2018 11:53:49 AM By: Gretta Cool, BSN, RN, CWS,  Maudie Mercury RN, BSN Entered By: Gretta Cool, BSN, RN, CWS, Kim on 07/03/2018 11:23:04 [name] ([id number]) -------------------------------------------------------------------------------- Wound Assessment Details Patient Name: Jesse Floor A. Date of Service: 07/03/2018 10:45 AM Medical Record Number: [id number] Patient Account Number: [id number] Date of Birth/Sex: [birth year] (84 y.o. M) Treating RN: Cornell Barman Primary Care Torrie Namba: PATIENT, NO Other Clinician: Referring Fawne Hughley: Referral,  Self Treating Elzada Pytel/Extender: STONE III, HOYT Weeks in Treatment: 1 Wound Status Wound Number: 3 Primary Diabetic Wound/Ulcer of the Lower Extremity Etiology: Wound Location: Right, Proximal Lower Leg Wound Healed - Epithelialized Wounding Event: Gradually Appeared Status: Date Acquired: 06/19/2018 Comorbid Cataracts, Sleep Apnea, Arrhythmia, Congestive Weeks Of Treatment: 1 History: Heart Failure, Hypertension, Peripheral Arterial Clustered Wound: No Disease, Type II Diabetes, Osteoarthritis Photos Photo Uploaded By: Secundino Ginger on 07/03/2018 11:31:03 Wound Measurements Length: (cm) 0 % Reduc Width: (cm) 0 % Reduc Depth: (cm) 0 Epithel Area: (cm) 0 Tunnel Volume: (cm) 0 Underm tion in Area: 100% tion in Volume: 100% ialization: None ing: No ining: No Wound Description Classification: Grade 1 Foul O Wound Margin: Flat and Intact Slough Exudate Amount: None Present dor After Cleansing: No /Fibrino No Wound Bed Granulation Amount: None Present (0%) Exposed Structure Necrotic Amount: Large (67-100%) Fascia Exposed: No Necrotic Quality: Eschar Fat Layer (Subcutaneous Tissue) Exposed: No Tendon Exposed: No Muscle Exposed: No Joint Exposed: No Bone Exposed: No Limited to Skin Breakdown Periwound Skin Texture Texture Color [name], [name] ([id number]) No Abnormalities Noted: No No Abnormalities Noted: No Callus: No Atrophie Blanche: No Crepitus: No Cyanosis: No Excoriation: No Ecchymosis: No Induration: No Erythema: No Rash: No Hemosiderin Staining: No Scarring: No Mottled: No Pallor: No Moisture Rubor: No No Abnormalities Noted: No Dry / Scaly: No Temperature / Pain Maceration: No Temperature: No Abnormality Wound Preparation Ulcer Cleansing: Rinsed/Irrigated with Saline Topical Anesthetic Applied: Other: lidocaine 4%, Electronic Signature(s) Signed: 07/04/2018 11:53:49 AM By: Gretta Cool, BSN, RN, CWS, Kim RN, BSN Entered By: Gretta Cool, BSN, RN, CWS,  Kim on 07/03/2018 11:19:45 [name] ([id number]) -------------------------------------------------------------------------------- Wound Assessment Details Patient Name: Jesse Floor A. Date of Service: 07/03/2018 10:45 AM Medical Record Number: [id number] Patient Account Number: [id number] Date of Birth/Sex: 11/22/1934 (84 y.o. M) Treating RN: Secundino Ginger Primary Care Frederich Montilla: PATIENT, NO Other Clinician: Referring Vela Render: Referral, Self Treating Annalena Piatt/Extender: STONE III, HOYT Weeks in Treatment: 1 Wound Status Wound Number: 4 Primary Diabetic Wound/Ulcer of the Lower Extremity Etiology: Wound Location: Right Lower Leg - Distal Wound Open Wounding Event: Gradually Appeared Status: Date Acquired: 06/19/2018 Comorbid Cataracts, Sleep Apnea, Arrhythmia, Congestive Weeks Of Treatment: 1 History: Heart Failure, Hypertension, Peripheral Arterial Clustered Wound: No Disease, Type II Diabetes, Osteoarthritis Photos Photo Uploaded By: Secundino Ginger on 07/03/2018 11:31:03 Wound Measurements Length: (cm) 0.3 % Reduction Width: (cm) 0.2 % Reduction Depth: (cm) 0.1 Epitheliali Area: (cm) 0.047 Tunneling: Volume: (cm) 0.005 Underminin in Area: 95.7% in Volume: 95.5% zation: None No g: No Wound Description Classification: Grade 1 Foul Odor Wound Margin: Flat and Intact Slough/Fib Exudate Amount: None Present After Cleansing: No rino No Wound Bed Granulation Amount: None Present (0%) Exposed Structure Necrotic Amount: None Present (0%) Fascia Exposed: No Fat Layer (Subcutaneous Tissue) Exposed: No Tendon Exposed: No Muscle Exposed: No Joint Exposed: No Bone Exposed: No Limited to Skin Breakdown Periwound Skin Texture Texture Color [name], [name] ([id number]) No Abnormalities Noted: No No Abnormalities Noted: No Callus: No Atrophie Blanche: No Crepitus: No Cyanosis: No Excoriation: No Ecchymosis: No Induration: No Erythema: No Rash: No Hemosiderin  Staining: No Scarring: No Mottled:  No Pallor: No Moisture Rubor: No No Abnormalities Noted: No Dry / Scaly: No Temperature / Pain Maceration: No Temperature: No Abnormality Wound Preparation Ulcer Cleansing: Rinsed/Irrigated with Saline Topical Anesthetic Applied: Other: lidocaine 4%, Electronic Signature(s) Signed: 07/03/2018 11:33:08 AM By: Secundino Ginger Entered By: Secundino Ginger on 07/03/2018 11:08:14 [name] ([id number]) -------------------------------------------------------------------------------- Vitals Details Patient Name: Jesse Floor A. Date of Service: 07/03/2018 10:45 AM Medical Record Number: [id number] Patient Account Number: [id number] Date of Birth/Sex: 1934/09/08 (84 y.o. M) Treating RN: Cornell Barman Primary Care Liz Pinho: PATIENT, NO Other Clinician: Referring Marcello Tuzzolino: Referral, Self Treating Octavia Mottola/Extender: STONE III, HOYT Weeks in Treatment: 1 Vital Signs Time Taken: 10:43 Temperature (F): 97.9 Height (in): 67 Pulse (bpm): 104 Weight (lbs): 180 Respiratory Rate (breaths/min): 16 Body Mass Index (BMI): 28.2 Blood Pressure (mmHg): 129/72 Reference Range: 80 - 120 mg / dl Airway Electronic Signature(s) Signed: 07/03/2018 4:24:19 PM By: Lorine Bears RCP, RRT, CHT Entered By: Lorine Bears on 07/03/2018 10:46:01

## 2018-07-10 ENCOUNTER — Encounter: Payer: Medicare Other | Admitting: Physician Assistant

## 2018-07-10 DIAGNOSIS — E11622 Type 2 diabetes mellitus with other skin ulcer: Secondary | ICD-10-CM | POA: Diagnosis not present

## 2018-07-12 NOTE — Progress Notes (Signed)
SHADD, DUNSTAN ([id number]) Visit Report for 07/10/2018 Chief Complaint Document Details Patient Name: [name], [name] Date of Service: 07/10/2018 2:30 PM Medical Record Number: [id number] Patient Account Number: [id number] Date of Birth/Sex: June 05, 1934 (84 y.o. M) Treating RN: Harold Barban Primary Care Provider: PATIENT, NO Other Clinician: Referring Provider: Referral, Self Treating Provider/Extender: Melburn Hake, HOYT Weeks in Treatment: 2 Information Obtained from: Patient Chief Complaint Right leg ulcers Electronic Signature(s) Signed: 07/11/2018 12:02:18 AM By: Worthy Keeler PA-C Entered By: Worthy Keeler on 07/10/2018 14:09:25 [name] ([id number]) -------------------------------------------------------------------------------- HPI Details Patient Name: Jesse Floor A. Date of Service: 07/10/2018 2:30 PM Medical Record Number: [id number] Patient Account Number: [id number] Date of Birth/Sex: 07/03/1934 (84 y.o. M) Treating RN: Harold Barban Primary Care Provider: PATIENT, NO Other Clinician: Referring Provider: Referral, Self Treating Provider/Extender: Melburn Hake, HOYT Weeks in Treatment: 2 History of Present Illness HPI Description: 11/29/16 patient presents today for evaluation concerning a nonhealing surgical wound following amputation of his left third through fifth toes which was performed on 10/15/16. Unfortunately following this amputation the circle side has not closed appropriately and he continues to have a significant amount of discomfort. He did have an x-ray performed on 10/21/16 which showed him being status post D3 toe amputation but otherwise no acute findings. Patient also has venous imaging of the left lower extremity which revealed no evidence of DVT on 10/22/16. His last hemoglobin A 1C was 8.5 and this was on 10/22/16 and prior to his amputation patient did have vascular surgery with Dr. Leotis Pain where he had a catheter placed in the left  anterior tibialis/dorsalis pedis artery from the right femoral approach. Percutaneous transluminal angioplasty of the left anterior tibial artery/dorsalis pedis artery was performed. This procedure apparently went very well. Unfortunately his healing just has not been what both Dr. Elvina Mattes nor himself were expecting or wanting. The question posed to [place] today is whether hyperbaric therapy would be of benefit for him. READMISSION 02/01/18 This is now a 84 year old man we had in the clinic over 2 years ago. At that point he was here for evaluation of amputations of several of his toes on his left foot with a nonhealing surgical wound. He was only here for 1 visit. The patient tells me he went on to have a left below-knee amputation slightly over a year ago. Sometime earlier this year perhaps in April he traumatized his right shin and he has had an open wound here since then. He is already seen Dr. Lucky Cowboy and vein and vascular. He had noninvasive tests that showed an ABI on the right of 1.23 but a TBI in the right of only 0.27. He had monophasic waveforms at the anterior tibial and biphasic at the posterior tibial. The overall interpretation was that although his resting right ankle brachial index is within normal limits this may be falsely elevated. Furthermore his toe brachial index was quite a bit reduced. According to the last note from vein and vascular on 01/27/18 they discussed angiogram with possible revascularization on the right. He was felt to be reasonably high risk because of his and inability inability to stay still requiring general anesthesia therefore. They told him to think about it and get back to them. He was referred here for review of this wound. As I understand things they've been using Xeroform I'm not clear what they've been putting on this although they did talk about "an ointment". 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 During his admission to hospital in July MRSA was apparently cultured from this wound and he received a course of Doxy. As he has had recent noninvasive arterial studies done on 01/27/18,no ABIs were felt to be necessary here. 02/08/18; patient arrives in with a better looking wound. Dimensions are smaller. using silver collagen 02/15/18; wound is measuring smaller. Still with adherent surface material requiring debridement. We've been using silver collagen 02/22/18; wound is measuring smaller. No debridement is required. We're using silver collagen under compression. 03/01/18; continued improvement in wound dimensions. No debridement is required. We've been using silver collagen under compression 03/08/18; wound stalled in terms of dimensions this week. Surface of the wound still looks healthy we've been using silver collagen under compression he has home health changing this. Change to Hydrofera Blue today 03/15/18; inadvertently put silver collagen on the wound today. In general his wound continues to contract. Using border foam cover 03/22/18; changed a dressing to calcium alginate. Wound on the right leg is just about closed. He continues to have irritated hyperkeratotic skin around the wound. He claims he did not have a skin lesion in this area that the wound was caused by trauma although the history is vague 03/29/18; using calcium alginate. Came in with debris over wound surface although her intake nurse noted drainage. The skin around this looks a lot better with the triamcinolone I prescribed. BARNES, FLOREK A. ([id number]) 04/05/18; used Calcium alginate. The wound is closed. This was initially traumatic it looks as though he has some degree of chronic venous insufficiency may be with stasis dermatitis. For now I have recommended simply  support stockings Readmission: 06/26/18 on evaluation today patient presents for reevaluation in our clinic concerning issue on his right medial lower extremity where it appears in my pinion that this was likely small skin tears from him scraping this part of his leg with that being said there does not appear to be any significant injury just several small. Partial thickness skin tears. Overall this is good news and that it's not too deep and hopefully should healed quite significantly faster compared to his previous admissions and what was going on at that point. There is no evidence of infection significantly although there's a little erythema around I believe this is likely more due to inflammation that it is to actually infection. Fortunately there is no sign of systemic infection. No fevers, chills, nausea, or vomiting noted at this time. 07/03/18 on evaluation today patient appears to be doing rather well in regard to his lower extremity ulcers. In fact of the five smaller areas that were open there only one of those locations still open as of today. Overall this is excellent news. 07/10/18 on evaluation today patient appears to be doing excellent in regard to his right lower extremity. In fact his wounds appear to be completely healed which is excellent news. There's no signs of infection at this time. Overall I'm very pleased in this regard. Electronic Signature(s) Signed: 07/11/2018 12:02:18 AM By: Worthy Keeler PA-C Entered By: Worthy Keeler on 07/10/2018 23:48:21 [name] ([id number]) -------------------------------------------------------------------------------- Physical Exam Details Patient Name: Jesse Floor A. Date of Service: 07/10/2018 2:30 PM Medical Record Number: [id number] Patient Account Number: [id number] Date of Birth/Sex: [birth year] (84 y.o. M) Treating RN: Harold Barban Primary Care Provider: PATIENT, NO Other Clinician:  Referring Provider: Referral,  Self Treating Provider/Extender: STONE III, HOYT Weeks in Treatment: 2 Constitutional Well-nourished and well-hydrated in no acute distress. Respiratory normal breathing without difficulty. Psychiatric this patient is able to make decisions and demonstrates good insight into disease process. Alert and Oriented x 3. pleasant and cooperative. Notes Patient's wound bed currently again show signs of complete epithelialization and though there was some dry skin there's no evidence of opening very happy with the fact that he healed so quickly. Electronic Signature(s) Signed: 07/11/2018 12:02:18 AM By: Worthy Keeler PA-C Entered By: Worthy Keeler on 07/10/2018 23:48:57 [name] ([id number]) -------------------------------------------------------------------------------- Physician Orders Details Patient Name: Jesse Floor A. Date of Service: 07/10/2018 2:30 PM Medical Record Number: [id number] Patient Account Number: [id number] Date of Birth/Sex: 1934-11-27 (83 y.o. M) Treating RN: Harold Barban Primary Care Provider: PATIENT, NO Other Clinician: Referring Provider: Referral, Self Treating Provider/Extender: Melburn Hake, HOYT Weeks in Treatment: 2 Verbal / Phone Orders: No Diagnosis Coding ICD-10 Coding Code Description E11.622 Type 2 diabetes mellitus with other skin ulcer E11.51 Type 2 diabetes mellitus with diabetic peripheral angiopathy without gangrene L97.811 Non-pressure chronic ulcer of other part of right lower leg limited to breakdown of skin Z89.512 Acquired absence of left leg below knee I50.42 Chronic combined systolic (congestive) and diastolic (congestive) heart failure I10 Essential (primary) hypertension I48.0 Paroxysmal atrial fibrillation Discharge From [facility] o Discharge from [place] - Call if you have any questions. Electronic Signature(s) Signed: 07/11/2018 12:02:18 AM By: Worthy Keeler PA-C Signed: 07/12/2018 8:37:22 AM By:  Harold Barban Entered By: Harold Barban on 07/10/2018 16:11:15 [name] ([id number]) -------------------------------------------------------------------------------- Problem List Details Patient Name: Jesse Floor A. Date of Service: 07/10/2018 2:30 PM Medical Record Number: [id number] Patient Account Number: [id number] Date of Birth/Sex: [birth year] (84 y.o. M) Treating RN: Harold Barban Primary Care Provider: PATIENT, NO Other Clinician: Referring Provider: Referral, Self Treating Provider/Extender: Melburn Hake, HOYT Weeks in Treatment: 2 Active Problems ICD-10 Evaluated Encounter Code Description Active Date Today Diagnosis E11.622 Type 2 diabetes mellitus with other skin ulcer 06/26/2018 No Yes E11.51 Type 2 diabetes mellitus with diabetic peripheral angiopathy 06/26/2018 No Yes without gangrene L97.811 Non-pressure chronic ulcer of other part of right lower leg 06/26/2018 No Yes limited to breakdown of skin Z89.512 Acquired absence of left leg below knee 06/26/2018 No Yes I50.42 Chronic combined systolic (congestive) and diastolic 11/23/7104 No Yes (congestive) heart failure I10 Essential (primary) hypertension 06/26/2018 No Yes I48.0 Paroxysmal atrial fibrillation 06/26/2018 No Yes Inactive Problems Resolved Problems Electronic Signature(s) Signed: 07/11/2018 12:02:18 AM By: Worthy Keeler PA-C Entered By: Worthy Keeler on 07/10/2018 14:09:20 [name] ([id number]) -------------------------------------------------------------------------------- Progress Note Details Patient Name: Jesse Floor A. Date of Service: 07/10/2018 2:30 PM Medical Record Number: [id number] Patient Account Number: [id number] Date of Birth/Sex: 06/26/1934 (84 y.o. M) Treating RN: Harold Barban Primary Care Provider: PATIENT, NO Other Clinician: Referring Provider: Referral, Self Treating Provider/Extender: Melburn Hake, HOYT Weeks in Treatment: 2 Subjective Chief  Complaint Information obtained from Patient Right leg ulcers History of Present Illness (HPI) 11/29/16 patient presents today for evaluation concerning a nonhealing surgical wound following amputation of his left third through fifth toes which was performed on 10/15/16. Unfortunately following this amputation the circle side has not closed appropriately and he continues to have a significant amount of discomfort. He did have an x-ray performed on 10/21/16 which showed him being status post D3 toe amputation but otherwise no acute findings. Patient also has venous imaging of the  left lower extremity which revealed no evidence of DVT on 10/22/16. His last hemoglobin A 1C was 8.5 and this was on 10/22/16 and prior to his amputation patient did have vascular surgery with Dr. Leotis Pain where he had a catheter placed in the left anterior tibialis/dorsalis pedis artery from the right femoral approach. Percutaneous transluminal angioplasty of the left anterior tibial artery/dorsalis pedis artery was performed. This procedure apparently went very well. Unfortunately his healing just has not been what both Dr. Elvina Mattes nor himself were expecting or wanting. The question posed to [place] today is whether hyperbaric therapy would be of benefit for him. READMISSION 02/01/18 This is now a 84 year old man we had in the clinic over 2 years ago. At that point he was here for evaluation of amputations of several of his toes on his left foot with a nonhealing surgical wound. He was only here for 1 visit. The patient tells me he went on to have a left below-knee amputation slightly over a year ago. Sometime earlier this year perhaps in April he traumatized his right shin and he has had an open wound here since then. He is already seen Dr. Lucky Cowboy and vein and vascular. He had noninvasive tests that showed an ABI on the right of 1.23 but a TBI in the right of only 0.27. He had monophasic waveforms at the anterior tibial and biphasic  at the posterior tibial. The overall interpretation was that although his resting right ankle brachial index is within normal limits this may be falsely elevated. Furthermore his toe brachial index was quite a bit reduced. According to the last note from vein and vascular on 01/27/18 they discussed angiogram with possible revascularization on the right. He was felt to be reasonably high risk because of his and inability inability to stay still requiring general anesthesia therefore. They told him to think about it and get back to them. He was referred here for review of this wound. As I understand things they've been using Xeroform I'm not clear what they've been putting on this although they did talk about "an ointment". 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 During his admission to hospital in July MRSA was apparently cultured from this wound and he received a course of Doxy. As he has had recent noninvasive arterial studies done on 01/27/18,no ABIs were felt to be necessary here. 02/08/18; patient arrives in with a better looking wound. Dimensions are smaller. using silver collagen 02/15/18; wound is measuring smaller. Still with adherent surface material requiring debridement. We've been using silver collagen 02/22/18; wound is measuring smaller. No debridement is required. We're using silver collagen under compression. 03/01/18; continued improvement in wound dimensions. No debridement is required. We've been using silver collagen under compression 03/08/18; wound stalled in terms of dimensions this week. Surface of the wound still looks healthy we've been using silver collagen under compression he has home health changing this. Change to Hydrofera Blue today 03/15/18; inadvertently put silver  collagen on the wound today. In general his wound continues to contract. Using border foam Beckley, Hawk A. ([id number]) cover 03/22/18; changed a dressing to calcium alginate. Wound on the right leg is just about closed. He continues to have irritated hyperkeratotic skin around the wound. He claims he did not have a skin lesion in this  area that the wound was caused by trauma although the history is vague 03/29/18; using calcium alginate. Came in with debris over wound surface although her intake nurse noted drainage. The skin around this looks a lot better with the triamcinolone I prescribed. 04/05/18; used Calcium alginate. The wound is closed. This was initially traumatic it looks as though he has some degree of chronic venous insufficiency may be with stasis dermatitis. For now I have recommended simply support stockings Readmission: 06/26/18 on evaluation today patient presents for reevaluation in our clinic concerning issue on his right medial lower extremity where it appears in my pinion that this was likely small skin tears from him scraping this part of his leg with that being said there does not appear to be any significant injury just several small. Partial thickness skin tears. Overall this is good news and that it's not too deep and hopefully should healed quite significantly faster compared to his previous admissions and what was going on at that point. There is no evidence of infection significantly although there's a little erythema around I believe this is likely more due to inflammation that it is to actually infection. Fortunately there is no sign of systemic infection. No fevers, chills, nausea, or vomiting noted at this time. 07/03/18 on evaluation today patient appears to be doing rather well in regard to his lower extremity ulcers. In fact of the five smaller areas that were open there only one of those locations still open as of today. Overall this is excellent news. 07/10/18  on evaluation today patient appears to be doing excellent in regard to his right lower extremity. In fact his wounds appear to be completely healed which is excellent news. There's no signs of infection at this time. Overall I'm very pleased in this regard. Patient History Information obtained from Patient. Family History Cancer - Siblings, Hypertension - Siblings, Stroke - Siblings, No family history of Diabetes, Heart Disease, Kidney Disease, Lung Disease, Seizures, Thyroid Problems, Tuberculosis. Social History Former smoker, Marital Status - Married, Alcohol Use - Never, Drug Use - No History, Caffeine Use - Daily. Medical History Eyes Patient has history of Cataracts - removed Denies history of Glaucoma, Optic Neuritis Ear/Nose/Mouth/Throat Denies history of Chronic sinus problems/congestion, Middle ear problems Hematologic/Lymphatic Denies history of Anemia, Hemophilia, Human Immunodeficiency Virus, Lymphedema, Sickle Cell Disease Respiratory Patient has history of Sleep Apnea - c-pap Denies history of Aspiration, Asthma, Chronic Obstructive Pulmonary Disease (COPD), Pneumothorax, Tuberculosis Cardiovascular Patient has history of Arrhythmia, Congestive Heart Failure, Hypertension, Peripheral Arterial Disease Denies history of Angina, Coronary Artery Disease, Deep Vein Thrombosis, Hypotension, Myocardial Infarction, Peripheral Venous Disease, Phlebitis, Vasculitis Gastrointestinal Denies history of Cirrhosis , Colitis, Crohn s, Hepatitis A, Hepatitis B, Hepatitis C Endocrine Patient has history of Type II Diabetes Denies history of Type I Diabetes Genitourinary Denies history of End Stage Renal Disease Immunological Mauger, Zakir A. ([id number]) Denies history of Lupus Erythematosus, Raynaud s, Scleroderma Integumentary (Skin) Denies history of History of Burn, History of pressure wounds Musculoskeletal Patient has history of Osteoarthritis Denies history of Gout,  Rheumatoid Arthritis, Osteomyelitis Neurologic Denies history of Dementia, Neuropathy, Quadriplegia, Paraplegia, Seizure Disorder Oncologic Denies history of Received Chemotherapy, Received Radiation Psychiatric Denies history of Anorexia/bulimia, Confinement Anxiety Medical And Surgical History Notes Constitutional Symptoms (General Health) HTN; Diabetes Type II, A-Fib; Neuropathy Musculoskeletal L BKA Review of Systems (ROS) Constitutional Symptoms (General Health) Denies complaints or symptoms of Fever, Chills. Respiratory The patient has no complaints or symptoms. Cardiovascular The patient has no complaints  or symptoms. Psychiatric The patient has no complaints or symptoms. Objective Constitutional Well-nourished and well-hydrated in no acute distress. Vitals Time Taken: 2:46 PM, Height: 67 in, Weight: 180 lbs, BMI: 28.2, Temperature: 98.4 F, Pulse: 73 bpm, Respiratory Rate: 16 breaths/min, Blood Pressure: 140/57 mmHg. Respiratory normal breathing without difficulty. Psychiatric this patient is able to make decisions and demonstrates good insight into disease process. Alert and Oriented x 3. pleasant and cooperative. General Notes: Patient's wound bed currently again show signs of complete epithelialization and though there was some dry skin there's no evidence of opening very happy with the fact that he healed so quickly. Integumentary (Hair, Skin) Wound #4 status is Healed - Epithelialized. Original cause of wound was Gradually Appeared. The wound is located on the Right,Distal Lower Leg. The wound measures 0cm length x 0cm width x 0cm depth; 0cm^2 area and 0cm^3 volume. The [place] ([id number]) wound is limited to skin breakdown. There is no tunneling or undermining noted. There is a none present amount of drainage noted. The wound margin is flat and intact. There is no granulation within the wound bed. There is no necrotic tissue within the wound bed. The  periwound skin appearance did not exhibit: Callus, Crepitus, Excoriation, Induration, Rash, Scarring, Dry/Scaly, Maceration, Atrophie Blanche, Cyanosis, Ecchymosis, Hemosiderin Staining, Mottled, Pallor, Rubor, Erythema. Periwound temperature was noted as No Abnormality. Assessment Active Problems ICD-10 Type 2 diabetes mellitus with other skin ulcer Type 2 diabetes mellitus with diabetic peripheral angiopathy without gangrene Non-pressure chronic ulcer of other part of right lower leg limited to breakdown of skin Acquired absence of left leg below knee Chronic combined systolic (congestive) and diastolic (congestive) heart failure Essential (primary) hypertension Paroxysmal atrial fibrillation Plan Discharge From [facility]: Discharge from [place] - Call if you have any questions. At this point we will discontinue wound care services since the patient appears to be doing so well. He is in agreement with plan. If anything changes or worsens he knows to contact the office and let me know. Electronic Signature(s) Signed: 07/11/2018 12:02:18 AM By: Worthy Keeler PA-C Entered By: Worthy Keeler on 07/10/2018 23:49:22 [name] ([id number]) -------------------------------------------------------------------------------- ROS/PFSH Details Patient Name: Jesse Floor A. Date of Service: 07/10/2018 2:30 PM Medical Record Number: [id number] Patient Account Number: [id number] Date of Birth/Sex: 1934/10/10 (84 y.o. M) Treating RN: Harold Barban Primary Care Provider: PATIENT, NO Other Clinician: Referring Provider: Referral, Self Treating Provider/Extender: STONE III, HOYT Weeks in Treatment: 2 Information Obtained From Patient Wound History Do you currently have one or more open woundso Yes How many open wounds do you currently haveo 2 Approximately how long have you had your woundso 1 week How have you been treating your wound(s) until nowo open to air Has your  wound(s) ever healed and then re-openedo No Have you had any lab work done in the past montho No Have you tested positive for an antibiotic resistant organism (MRSA, VRE)o No Have you tested positive for osteomyelitis (bone infection)o No Have you had any tests for circulation on your legso Yes Where was the test doneo [facility] Constitutional Symptoms (General Health) Complaints and Symptoms: Negative for: Fever; Chills Medical History: Past Medical History Notes: HTN; Diabetes Type II, A-Fib; Neuropathy Eyes Medical History: Positive for: Cataracts - removed Negative for: Glaucoma; Optic Neuritis Ear/Nose/Mouth/Throat Medical History: Negative for: Chronic sinus problems/congestion; Middle ear problems Hematologic/Lymphatic Medical History: Negative for: Anemia; Hemophilia; Human Immunodeficiency Virus; Lymphedema; Sickle Cell Disease Respiratory Complaints and Symptoms: No Complaints or  Symptoms Medical History: Positive for: Sleep Apnea - c-pap Negative for: Aspiration; Asthma; Chronic Obstructive Pulmonary Disease (COPD); Pneumothorax; Tuberculosis Shelnutt, Bond A. ([id number]) Cardiovascular Complaints and Symptoms: No Complaints or Symptoms Medical History: Positive for: Arrhythmia; Congestive Heart Failure; Hypertension; Peripheral Arterial Disease Negative for: Angina; Coronary Artery Disease; Deep Vein Thrombosis; Hypotension; Myocardial Infarction; Peripheral Venous Disease; Phlebitis; Vasculitis Gastrointestinal Medical History: Negative for: Cirrhosis ; Colitis; Crohnos; Hepatitis A; Hepatitis B; Hepatitis C Endocrine Medical History: Positive for: Type II Diabetes Negative for: Type I Diabetes Time with diabetes: 30 years Treated with: Insulin, Oral agents Blood sugar tested every day: No Blood sugar testing results: Breakfast: 168 Genitourinary Medical History: Negative for: End Stage Renal Disease Immunological Medical History: Negative for: Lupus  Erythematosus; Raynaudos; Scleroderma Integumentary (Skin) Medical History: Negative for: History of Burn; History of pressure wounds Musculoskeletal Medical History: Positive for: Osteoarthritis Negative for: Gout; Rheumatoid Arthritis; Osteomyelitis Past Medical History Notes: L BKA Neurologic Medical History: Negative for: Dementia; Neuropathy; Quadriplegia; Paraplegia; Seizure Disorder Oncologic Medical History: Negative for: Received Chemotherapy; Received Radiation TALLEY, KREISER ([id number]) Psychiatric Complaints and Symptoms: No Complaints or Symptoms Medical History: Negative for: Anorexia/bulimia; Confinement Anxiety HBO Extended History Items Eyes: Cataracts Immunizations Pneumococcal Vaccine: Received Pneumococcal Vaccination: Yes Implantable Devices Family and Social History Cancer: Yes - Siblings; Diabetes: No; Heart Disease: No; Hypertension: Yes - Siblings; Kidney Disease: No; Lung Disease: No; Seizures: No; Stroke: Yes - Siblings; Thyroid Problems: No; Tuberculosis: No; Former smoker; Marital Status - Married; Alcohol Use: Never; Drug Use: No History; Caffeine Use: Daily; Advanced Directives: No; Patient does not want information on Advanced Directives; Do not resuscitate: No; Living Will: No; Medical Power of Attorney: No Physician Affirmation I have reviewed and agree with the above information. Electronic Signature(s) Signed: 07/11/2018 12:02:18 AM By: Worthy Keeler PA-C Signed: 07/12/2018 8:37:22 AM By: Harold Barban Entered By: Worthy Keeler on 07/10/2018 23:48:39 [place], [name] ([id number]) -------------------------------------------------------------------------------- SuperBill Details Patient Name: Jesse Floor A. Date of Service: 07/10/2018 Medical Record Number: [id number] Patient Account Number: [id number] Date of Birth/Sex: 03-22-1935 (84 y.o. 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 diastolic (congestive) heart failure I10 Essential (primary) hypertension I48.0 Paroxysmal atrial fibrillation Physician Procedures CPT4 Code Description: [id number] - WC PHYS LEVEL 2 - EST PT ICD-10 Diagnosis Description E11.622 Type 2 diabetes mellitus with other skin ulcer E11.51 Type 2 diabetes mellitus with diabetic peripheral angiopathy w L97.811 Non-pressure chronic ulcer  of other part of right lower leg li Z89.512 Acquired absence of left leg below knee Modifier: ithout gangrene mited to breakdown Quantity: 1 of skin Electronic Signature(s) Signed: 07/11/2018 12:02:18 AM By: Worthy Keeler PA-C Entered By: Worthy Keeler on 07/10/2018 23:49:40

## 2018-07-15 NOTE — Progress Notes (Addendum)
[name], [name] ([id number]) Visit Report for 07/10/2018 Arrival Information Details Patient Name: [name], [name] Date of Service: 07/10/2018 2:30 PM Medical Record Number: [id number] Patient Account Number: [id number] Date of Birth/Sex: [birth year] (84 y.o. M) Treating RN: Army Melia Primary Care Lurdes Haltiwanger: PATIENT, NO Other Clinician: Referring Wilson Dusenbery: Referral, Self Treating Zoua Caporaso/Extender: STONE III, HOYT Weeks in Treatment: 2 Visit Information History Since Last Visit Added or deleted any medications: No Patient Arrived: Wheel Chair Any new allergies or adverse reactions: No Arrival Time: 14:45 Had a fall or experienced change in No Accompanied By: wife activities of daily living that may affect Transfer Assistance: None risk of falls: Patient Identification Verified: Yes Signs or symptoms of abuse/neglect since last visito No Patient Has Alerts: Yes Hospitalized since last visit: No Patient Alerts: ABI 01/27/18 R 1.23 Implantable device outside of the clinic excluding No DMII cellular tissue based products placed in the center since last visit: Has Dressing in Place as Prescribed: Yes Pain Present Now: No Electronic Signature(s) Signed: 07/14/2018 4:09:31 PM By: Army Melia Entered By: Army Melia on 07/10/2018 14:45:55 Ducat, [name] ([id number]) -------------------------------------------------------------------------------- Clinic Level of Care Assessment Details Patient Name: Jesse Floor A. Date of Service: 07/10/2018 2:30 PM Medical Record Number: [id number] Patient Account Number: [id number] Date of Birth/Sex: [birth year] (84 y.o. M) Treating RN: Harold Barban Primary Care Dontell Mian: PATIENT, NO Other Clinician: Referring Sudeep Scheibel: Referral, Self Treating Glennon Kopko/Extender: STONE III, HOYT Weeks in Treatment: 2 Clinic Level of Care Assessment Items TOOL 4 Quantity Score []  - Use when only an EandM is performed on FOLLOW-UP visit 0 ASSESSMENTS  - Nursing Assessment / Reassessment []  - Reassessment of Co-morbidities (includes updates in patient status) 0 X- 1 5 Reassessment of Adherence to Treatment Plan ASSESSMENTS - Wound and Skin Assessment / Reassessment X - Simple Wound Assessment / Reassessment - one wound 1 5 []  - 0 Complex Wound Assessment / Reassessment - multiple wounds []  - 0 Dermatologic / Skin Assessment (not related to wound area) ASSESSMENTS - Focused Assessment []  - Circumferential Edema Measurements - multi extremities 0 []  - 0 Nutritional Assessment / Counseling / Intervention []  - 0 Lower Extremity Assessment (monofilament, tuning fork, pulses) []  - 0 Peripheral Arterial Disease Assessment (using hand held doppler) ASSESSMENTS - Ostomy and/or Continence Assessment and Care []  - Incontinence Assessment and Management 0 []  - 0 Ostomy Care Assessment and Management (repouching, etc.) PROCESS - Coordination of Care X - Simple Patient / Family Education for ongoing care 1 15 []  - 0 Complex (extensive) Patient / Family Education for ongoing care []  - 0 Staff obtains Programmer, systems, Records, Test Results / Process Orders []  - 0 Staff telephones HHA, Nursing Homes / Clarify orders / etc []  - 0 Routine Transfer to another Facility (non-emergent condition) []  - 0 Routine Hospital Admission (non-emergent condition) []  - 0 New Admissions / Biomedical engineer / Ordering NPWT, Apligraf, etc. []  - 0 Emergency Hospital Admission (emergent condition) X- 1 10 Simple Discharge Coordination [name], [name] ([id number]) []  - 0 Complex (extensive) Discharge Coordination PROCESS - Special Needs []  - Pediatric / Minor Patient Management 0 []  - 0 Isolation Patient Management []  - 0 Hearing / Language / Visual special needs []  - 0 Assessment of Community assistance (transportation, D/C planning, etc.) []  - 0 Additional assistance / Altered mentation []  - 0 Support Surface(s) Assessment (bed, cushion, seat,  etc.) INTERVENTIONS - Wound Cleansing / Measurement []  - Simple Wound Cleansing - one wound 0 []  - 0 Complex Wound Cleansing - multiple wounds  X- 1 5 Wound Imaging (photographs - any number of wounds) []  - 0 Wound Tracing (instead of photographs) []  - 0 Simple Wound Measurement - one wound []  - 0 Complex Wound Measurement - multiple wounds INTERVENTIONS - Wound Dressings []  - Small Wound Dressing one or multiple wounds 0 []  - 0 Medium Wound Dressing one or multiple wounds []  - 0 Large Wound Dressing one or multiple wounds []  - 0 Application of Medications - topical []  - 0 Application of Medications - injection INTERVENTIONS - Miscellaneous []  - External ear exam 0 []  - 0 Specimen Collection (cultures, biopsies, blood, body fluids, etc.) []  - 0 Specimen(s) / Culture(s) sent or taken to Lab for analysis []  - 0 Patient Transfer (multiple staff / Civil Service fast streamer / Similar devices) []  - 0 Simple Staple / Suture removal (25 or less) []  - 0 Complex Staple / Suture removal (26 or more) []  - 0 Hypo / Hyperglycemic Management (close monitor of Blood Glucose) []  - 0 Ankle / Brachial Index (ABI) - do not check if billed separately X- 1 5 Vital Signs [name], [name] ([id number]) Has the patient been seen at the hospital within the last three years: Yes Total Score: 45 Level Of Care: New/Established - Level 2 Electronic Signature(s) Unsigned Entered By: Harold Barban on 07/13/2018 11:46:48 Signature(s): Date(s): [name] ([id number]) -------------------------------------------------------------------------------- Lower Extremity Assessment Details Patient Name: [name], [name] Date of Service: 07/10/2018 2:30 PM Medical Record Number: [id number] Patient Account Number: [id number] Date of Birth/Sex: May 29, 1934 (84 y.o. M) Treating RN: Army Melia Primary Care Laurence Folz: PATIENT, NO Other Clinician: Referring Emoni Whitworth: Referral, Self Treating Ralynn San/Extender:  STONE III, HOYT Weeks in Treatment: 2 Edema Assessment Assessed: [Left: No] [Right: No] Edema: [Left: N] [Right: o] Vascular Assessment Pulses: Posterior Tibial Extremity colors, hair growth, and conditions: Extremity Color: [Right:Normal] Hair Growth on Extremity: [Right:No] Temperature of Extremity: [Right:Warm] Capillary Refill: [Right:< 3 seconds] Toe Nail Assessment Left: Right: Thick: Yes Discolored: No Deformed: No Improper Length and Hygiene: No Electronic Signature(s) Signed: 07/14/2018 4:09:31 PM By: Army Melia Entered By: Army Melia on 07/10/2018 14:51:00 Fickel, [name] ([id number]) -------------------------------------------------------------------------------- Multi Wound Chart Details Patient Name: Jesse Floor A. Date of Service: 07/10/2018 2:30 PM Medical Record Number: [id number] Patient Account Number: [id number] Date of Birth/Sex: 07/11/1934 (84 y.o. M) Treating RN: Harold Barban Primary Care Jahshua Bonito: PATIENT, NO Other Clinician: Referring Brittley Regner: Referral, Self Treating Nolene Rocks/Extender: STONE III, HOYT Weeks in Treatment: 2 Vital Signs Height(in): 4 Pulse(bpm): 80 Weight(lbs): 180 Blood Pressure(mmHg): 140/57 Body Mass Index(BMI): 28 Temperature(F): 98.4 Respiratory Rate 16 (breaths/min): Photos: [4:No Photos] [N/A:N/A] Wound Location: [4:Right Lower Leg - Distal] [N/A:N/A] Wounding Event: [4:Gradually Appeared] [N/A:N/A] Primary Etiology: [4:Diabetic Wound/Ulcer of the Lower Extremity] [N/A:N/A] Comorbid History: [4:Cataracts, Sleep Apnea, Arrhythmia, Congestive Heart Failure, Hypertension, Peripheral Arterial Disease, Type II Diabetes, Osteoarthritis] [N/A:N/A] Date Acquired: [4:06/19/2018] [N/A:N/A] Weeks of Treatment: [4:2] [N/A:N/A] Wound Status: [4:Open] [N/A:N/A] Measurements L x W x D [4:0.1x0.1x0.1] [N/A:N/A] (cm) Area (cm) : [4:0.008] [N/A:N/A] Volume (cm) : [4:0.001] [N/A:N/A] % Reduction in Area: [4:99.30%]  [N/A:N/A] % Reduction in Volume: [4:99.10%] [N/A:N/A] Classification: [4:Grade 1] [N/A:N/A] Exudate Amount: [4:None Present] [N/A:N/A] Wound Margin: [4:Flat and Intact] [N/A:N/A] Granulation Amount: [4:None Present (0%)] [N/A:N/A] Necrotic Amount: [4:None Present (0%)] [N/A:N/A] Exposed Structures: [4:Fascia: No Fat Layer (Subcutaneous Tissue) Exposed: No Tendon: No Muscle: No Joint: No Bone: No Limited to Skin Breakdown] [N/A:N/A] Epithelialization: [4:None] [N/A:N/A] Periwound Skin Texture: [4:Excoriation: No Induration: No Callus: No] [N/A:N/A] Crepitus: No Rash: No Scarring: No Periwound Skin Moisture:  Maceration: No N/A N/A Dry/Scaly: No Periwound Skin Color: Atrophie Blanche: No N/A N/A Cyanosis: No Ecchymosis: No Erythema: No Hemosiderin Staining: No Mottled: No Pallor: No Rubor: No Temperature: No Abnormality N/A N/A Tenderness on Palpation: No N/A N/A Wound Preparation: Ulcer Cleansing: N/A N/A Rinsed/Irrigated with Saline Topical Anesthetic Applied: None Treatment Notes Electronic Signature(s) Signed: 07/12/2018 8:37:22 AM By: Harold Barban Entered By: Harold Barban on 07/10/2018 16:09:45 [name] ([id number]) -------------------------------------------------------------------------------- Multi-Disciplinary Care Plan Details Patient Name: Jesse Floor A. Date of Service: 07/10/2018 2:30 PM Medical Record Number: [id number] Patient Account Number: [id number] Date of Birth/Sex: 17-May-1935 (84 y.o. M) Treating RN: Harold Barban Primary Care Kahlil Cowans: PATIENT, NO Other Clinician: Referring Kameelah Minish: Referral, Self Treating Evyn Kooyman/Extender: Melburn Hake, HOYT Weeks in Treatment: 2 Active Inactive Electronic Signature(s) Signed: 07/17/2018 1:29:55 PM By: Harold Barban Previous Signature: 07/12/2018 8:37:22 AM Version By: Harold Barban Entered By: Harold Barban on 07/17/2018 13:29:55 [name], [name]  ([id number]) -------------------------------------------------------------------------------- Pain Assessment Details Patient Name: Jesse Floor A. Date of Service: 07/10/2018 2:30 PM Medical Record Number: [id number] Patient Account Number: [id number] Date of Birth/Sex: [birth year] (84 y.o. M) Treating RN: Army Melia Primary Care Lya Holben: PATIENT, NO Other Clinician: Referring Sheela Mcculley: Referral, Self Treating Lory Galan/Extender: STONE III, HOYT Weeks in Treatment: 2 Active Problems Location of Pain Severity and Description of Pain Patient Has Paino No Site Locations Pain Management and Medication Current Pain Management: Electronic Signature(s) Signed: 07/14/2018 4:09:31 PM By: Army Melia Entered By: Army Melia on 07/10/2018 14:46:03 [name] ([id number]) -------------------------------------------------------------------------------- Patient/Caregiver Education Details Patient Name: Jesse Floor A. Date of Service: 07/10/2018 2:30 PM Medical Record Number: [id number] Patient Account Number: [id number] Date of Birth/Gender: [birth year] (84 y.o. M) Treating RN: Harold Barban Primary Care Physician: PATIENT, NO Other Clinician: Referring Physician: Referral, Self Treating Physician/Extender: Melburn Hake, HOYT Weeks in Treatment: 2 Education Assessment Education Provided To: Patient Education Topics Provided Wound/Skin Impairment: Handouts: Caring for Your Ulcer Methods: Demonstration, Explain/Verbal Responses: State content correctly Electronic Signature(s) Signed: 07/12/2018 8:37:22 AM By: Harold Barban Entered By: Harold Barban on 07/10/2018 16:10:03 [name] ([id number]) -------------------------------------------------------------------------------- Wound Assessment Details Patient Name: Jesse Floor A. Date of Service: 07/10/2018 2:30 PM Medical Record Number: [id number] Patient Account Number: [id number] Date of Birth/Sex: [birth year]  (84 y.o. M) Treating RN: Harold Barban Primary Care Kassadee Carawan: PATIENT, NO Other Clinician: Referring Dewanda Fennema: Referral, Self Treating Analeese Andreatta/Extender: STONE III, HOYT Weeks in Treatment: 2 Wound Status Wound Number: 4 Primary Diabetic Wound/Ulcer of the Lower Extremity Etiology: Wound Location: Right, Distal Lower Leg Wound Healed - Epithelialized Wounding Event: Gradually Appeared Status: Date Acquired: 06/19/2018 Comorbid Cataracts, Sleep Apnea, Arrhythmia, Congestive Weeks Of Treatment: 2 History: Heart Failure, Hypertension, Peripheral Arterial Clustered Wound: No Disease, Type II Diabetes, Osteoarthritis Photos Photo Uploaded By: Army Melia on 07/11/2018 10:02:40 Wound Measurements Length: (cm) 0 % Reduct Width: (cm) 0 % Reduct Depth: (cm) 0 Epitheli Area: (cm) 0 Tunneli Volume: (cm) 0 Undermi ion in Area: 100% ion in Volume: 100% alization: None ng: No ning: No Wound Description Classification: Grade 1 Foul Odo Wound Margin: Flat and Intact Slough/F Exudate Amount: None Present r After Cleansing: No ibrino No Wound Bed Granulation Amount: None Present (0%) Exposed Structure Necrotic Amount: None Present (0%) Fascia Exposed: No Fat Layer (Subcutaneous Tissue) Exposed: No Tendon Exposed: No Muscle Exposed: No Joint Exposed: No Bone Exposed: No Limited to Skin Breakdown Periwound Skin Texture Texture Color Romer, Darus A. ([id number]) No Abnormalities Noted: No No Abnormalities Noted: No Callus: No Atrophie Blanche: No Crepitus: No Cyanosis:  No Excoriation: No Ecchymosis: No Induration: No Erythema: No Rash: No Hemosiderin Staining: No Scarring: No Mottled: No Pallor: No Moisture Rubor: No No Abnormalities Noted: No Dry / Scaly: No Temperature / Pain Maceration: No Temperature: No Abnormality Wound Preparation Ulcer Cleansing: Rinsed/Irrigated with Saline Topical Anesthetic Applied: None Electronic Signature(s) Signed:  07/12/2018 8:37:22 AM By: Harold Barban Entered By: Harold Barban on 07/10/2018 [name], [name] ([id number]) -------------------------------------------------------------------------------- Vitals Details Patient Name: Jesse Floor A. Date of Service: 07/10/2018 2:30 PM Medical Record Number: [id number] Patient Account Number: [id number] Date of Birth/Sex: 1934/12/22 (84 y.o. M) Treating RN: Army Melia Primary Care Susano Cleckler: PATIENT, NO Other Clinician: Referring Zavian Slowey: Referral, Self Treating Mitsuko Luera/Extender: STONE III, HOYT Weeks in Treatment: 2 Vital Signs Time Taken: 14:46 Temperature (F): 98.4 Height (in): 67 Pulse (bpm): 73 Weight (lbs): 180 Respiratory Rate (breaths/min): 16 Body Mass Index (BMI): 28.2 Blood Pressure (mmHg): 140/57 Reference Range: 80 - 120 mg / dl Electronic Signature(s) Signed: 07/14/2018 4:09:31 PM By: Army Melia Entered By: Army Melia on 07/10/2018 14:46:35

## 2018-07-21 ENCOUNTER — Telehealth (INDEPENDENT_AMBULATORY_CARE_PROVIDER_SITE_OTHER): Payer: Self-pay | Admitting: Vascular Surgery

## 2018-07-21 NOTE — Telephone Encounter (Signed)
Please schedule the patient for a lower extremtiy duplex for his left leg per Eulogio Ditch.

## 2018-07-24 ENCOUNTER — Other Ambulatory Visit (INDEPENDENT_AMBULATORY_CARE_PROVIDER_SITE_OTHER): Payer: Self-pay | Admitting: Nurse Practitioner

## 2018-07-24 DIAGNOSIS — M79605 Pain in left leg: Secondary | ICD-10-CM

## 2018-07-25 ENCOUNTER — Other Ambulatory Visit (INDEPENDENT_AMBULATORY_CARE_PROVIDER_SITE_OTHER): Payer: Self-pay | Admitting: Nurse Practitioner

## 2018-07-25 DIAGNOSIS — Z89512 Acquired absence of left leg below knee: Secondary | ICD-10-CM

## 2018-07-25 DIAGNOSIS — T8789 Other complications of amputation stump: Secondary | ICD-10-CM

## 2018-07-25 DIAGNOSIS — M79605 Pain in left leg: Secondary | ICD-10-CM

## 2018-07-27 ENCOUNTER — Encounter (INDEPENDENT_AMBULATORY_CARE_PROVIDER_SITE_OTHER): Payer: Self-pay | Admitting: Nurse Practitioner

## 2018-07-27 ENCOUNTER — Ambulatory Visit (INDEPENDENT_AMBULATORY_CARE_PROVIDER_SITE_OTHER): Payer: Medicare Other | Admitting: Nurse Practitioner

## 2018-07-27 ENCOUNTER — Other Ambulatory Visit: Payer: Self-pay

## 2018-07-27 ENCOUNTER — Ambulatory Visit (INDEPENDENT_AMBULATORY_CARE_PROVIDER_SITE_OTHER): Payer: Medicare Other

## 2018-07-27 VITALS — BP 149/71 | HR 50 | Temp 97.9°F | Resp 12 | Ht 67.0 in | Wt 180.0 lb

## 2018-07-27 DIAGNOSIS — I10 Essential (primary) hypertension: Secondary | ICD-10-CM

## 2018-07-27 DIAGNOSIS — T8789 Other complications of amputation stump: Secondary | ICD-10-CM

## 2018-07-27 DIAGNOSIS — Z794 Long term (current) use of insulin: Secondary | ICD-10-CM

## 2018-07-27 DIAGNOSIS — M79605 Pain in left leg: Secondary | ICD-10-CM

## 2018-07-27 DIAGNOSIS — E119 Type 2 diabetes mellitus without complications: Secondary | ICD-10-CM | POA: Diagnosis not present

## 2018-07-27 DIAGNOSIS — T879 Unspecified complications of amputation stump: Secondary | ICD-10-CM

## 2018-07-27 DIAGNOSIS — L03116 Cellulitis of left lower limb: Secondary | ICD-10-CM

## 2018-07-27 DIAGNOSIS — Z79899 Other long term (current) drug therapy: Secondary | ICD-10-CM

## 2018-07-27 DIAGNOSIS — Z89512 Acquired absence of left leg below knee: Secondary | ICD-10-CM

## 2018-07-27 DIAGNOSIS — Z87891 Personal history of nicotine dependence: Secondary | ICD-10-CM

## 2018-07-27 MED ORDER — DOXYCYCLINE HYCLATE 100 MG PO CAPS: 100.0000 mg | ORAL_CAPSULE | Freq: Two times a day (BID) | ORAL | 0 refills | Status: DC

## 2018-07-27 NOTE — Progress Notes (Signed)
SUBJECTIVE:  Patient ID: [name], male    DOB: [birth year], 84 y.o.   MRN: [id number] Chief Complaint  Patient presents with  . Follow-up    HPI   [name] is a 84 y.o. male that presents today today for signs of pain in left stump.  The patient states that he recently got a new prosthetic from a company called [employer] [employer].  Since this time he states that his prosthetic is felt too tight.  He cannot put pressure on prosthetic without being exquisitely painful.  His stump today appears erythematous and is painful to palpation.  He states that his stump wound has been fully healed for a year.  It is most painful at the medial edge.  Patient stated that he company that made his prosthetic want him to be evaluated to ensure that there were no issues with his stump versus issues with the prosthetic.  The patient denies any fever, chills, nausea, vomiting or diarrhea.  Patient denies any chest pain or shortness of breath.  He denies any TIA-like symptoms.  Patient underwent a lower extremity arterial duplex which revealed flow within the common femoral and deep femoral artery which is consistent with an amputation.  Past Medical History:  Diagnosis Date  . Arthritis   . Atrial fibrillation ([place])   . Carcinoma of prostate ([place])   . CHF (congestive heart failure) ([place])   . Chronic kidney disease   . Diabetes mellitus without complication ([place])   . ED (erectile dysfunction)   . Frequent urination   . Hyperlipidemia   . Hypertension   . Moderate mitral insufficiency   . Peripheral vascular disease ([place])   . Pneumonia 04/2016  . Prostate cancer ([place])   . Sleep apnea    OSA--USE C-PAP  . Ulcer of left foot due to type 2 diabetes mellitus ([place])   . Urinary stress incontinence, male     Past Surgical History:  Procedure Laterality Date  . AMPUTATION Left 01/12/2017   Procedure: AMPUTATION BELOW KNEE;  Surgeon: Algernon Huxley, MD;  Location: [facility];  Service: General;   Laterality: Left;  . APLIGRAFT PLACEMENT Left 10/15/2016   Procedure: APLIGRAFT PLACEMENT;  Surgeon: Albertine Patricia, DPM;  Location: [facility];  Service: Podiatry;  Laterality: Left;  necrotic ulcer  . CHOLECYSTECTOMY    . EYE SURGERY Bilateral    Cataract Extraction with IOL  . IRRIGATION AND DEBRIDEMENT FOOT Left 10/15/2016   Procedure: IRRIGATION AND DEBRIDEMENT FOOT-EXCISIONAL DEBRIDEMENT OF SKIN 3RD, 4TH AND 5TH TOES WITH APPLICATION OF APLIGRAFT;  Surgeon: Albertine Patricia, DPM;  Location: [facility];  Service: Podiatry;  Laterality: Left;  necrotic,gangrene  . IRRIGATION AND DEBRIDEMENT FOOT Left 12/09/2016   Procedure: IRRIGATION AND DEBRIDEMENT FOOT-MUSCLE/FASCIA, RESECTION OF FOURTH AND FIFTH METATARSAL NECROTIC BONE;  Surgeon: Albertine Patricia, DPM;  Location: [facility];  Service: Podiatry;  Laterality: Left;  . LEFT HEART CATH AND CORONARY ANGIOGRAPHY N/A 06/05/2018   Procedure: LEFT HEART CATH AND CORONARY ANGIOGRAPHY;  Surgeon: Yolonda Kida, MD;  Location: [place] [facility];  Service: Cardiovascular;  Laterality: N/A;  . LOWER EXTREMITY ANGIOGRAPHY Left 08/16/2016   Procedure: Lower Extremity Angiography;  Surgeon: Algernon Huxley, MD;  Location: [place] [facility];  Service: Cardiovascular;  Laterality: Left;  . LOWER EXTREMITY ANGIOGRAPHY Left 09/01/2016   Procedure: Lower Extremity Angiography;  Surgeon: Algernon Huxley, MD;  Location: [place] [facility];  Service: Cardiovascular;  Laterality: Left;  . LOWER EXTREMITY ANGIOGRAPHY Left 10/14/2016  Procedure: Lower Extremity Angiography;  Surgeon: Algernon Huxley, MD;  Location: [place] [facility];  Service: Cardiovascular;  Laterality: Left;  . LOWER EXTREMITY ANGIOGRAPHY Left 12/20/2016   Procedure: Lower Extremity Angiography;  Surgeon: Algernon Huxley, MD;  Location: [place] [facility];  Service: Cardiovascular;  Laterality: Left;  . LOWER EXTREMITY INTERVENTION  09/01/2016   Procedure: Lower Extremity Intervention;  Surgeon: Algernon Huxley, MD;  Location: [place] [facility];  Service: Cardiovascular;;  . PROSTATE SURGERY     removal  . TONSILLECTOMY     as a child    Social History   Socioeconomic History  . Marital status: Married    Spouse name: Not on file  . Number of children: Not on file  . Years of education: Not on file  . Highest education level: Not on file  Occupational History  . Occupation: retired  Scientific laboratory technician  . Financial resource strain: Not on file  . Food insecurity:    Worry: Not on file    Inability: Not on file  . Transportation needs:    Medical: Not on file    Non-medical: Not on file  Tobacco Use  . Smoking status: Former Smoker    Packs/day: 0.25    Types: Cigarettes    Last attempt to quit: 10/14/1994    Years since quitting: 23.8  . Smokeless tobacco: Never Used  Substance and Sexual Activity  . Alcohol use: No    Alcohol/week: 0.0 standard drinks  . Drug use: No  . Sexual activity: Not on file  Lifestyle  . Physical activity:    Days per week: Not on file    Minutes per session: Not on file  . Stress: Not on file  Relationships  . Social connections:    Talks on phone: Not on file    Gets together: Not on file    Attends religious service: Not on file    Active member of club or organization: Not on file    Attends meetings of clubs or organizations: Not on file    Relationship status: Not on file  . Intimate partner violence:    Fear of current or ex partner: Not on file    Emotionally abused: Not on file    Physically abused: Not on file    Forced sexual activity: Not on file  Other Topics Concern  . Not on file  Social History Narrative  . Not on file    Family History  Problem Relation Age of Onset  . Hypertension Mother   . Diabetes Mother   . Prostate cancer Neg Hx   . Bladder Cancer Neg Hx   . Kidney disease Neg Hx     Allergies  Allergen Reactions  . Contrast Media [Iodinated Diagnostic Agents] Shortness Of Breath    SOB  . Iohexol  Shortness Of Breath     Desc: Respiratory Distress, Laryngedema, diaphoresis, Onset Date: [id number]   . Metrizamide Shortness Of Breath    SOB SOB SOB   . Latex Rash     Review of Systems   Review of Systems: Negative Unless Checked Constitutional: [] Weight loss  [] Fever  [] Chills Cardiac: [] Chest pain   [x]  Atrial Fibrillation  [] Palpitations   [] Shortness of breath when laying flat   [] Shortness of breath with exertion. [] Shortness of breath at rest Vascular:  [] Pain in legs with walking   [x] Pain in legs with standing [] Pain in legs when laying flat   [] Claudication    []   Pain in feet when laying flat    [] History of DVT   [] Phlebitis   [] Swelling in legs   [] Varicose veins   [] Non-healing ulcers Pulmonary:   [] Uses home oxygen   [] Productive cough   [] Hemoptysis   [] Wheeze  [] COPD   [] Asthma Neurologic:  [] Dizziness   [] Seizures  [] Blackouts [] History of stroke   [] History of TIA  [] Aphasia   [] Temporary Blindness   [] Weakness or numbness in arm   [] Weakness or numbness in leg Musculoskeletal:   [] Joint swelling   [] Joint pain   [] Low back pain  []  History of Knee Replacement [] Arthritis [] back Surgeries  []  Spinal Stenosis    Hematologic:  [] Easy bruising  [] Easy bleeding   [] Hypercoagulable state   [] Anemic Gastrointestinal:  [] Diarrhea   [] Vomiting  [] Gastroesophageal reflux/heartburn   [] Difficulty swallowing. [] Abdominal pain Genitourinary:  [] Chronic kidney disease   [] Difficult urination  [] Anuric   [] Blood in urine [x] Frequent urination  [] Burning with urination   [] Hematuria Skin:  [] Rashes   [] Ulcers [] Wounds Psychological:  [] History of anxiety   []  History of major depression  []  Memory Difficulties      OBJECTIVE:   Physical Exam  BP (!) 149/71 (BP Location: Left Arm, Patient Position: Sitting, Cuff Size: Large)   Pulse (!) 50   Temp 97.9 F (36.6 C) (Oral)   Resp 12   Ht 5\' 7"  (1.702 m)   Wt 180 lb (81.6 kg)   BMI 28.19 kg/m   Gen: WD/WN, NAD Head: [place]/AT, No  temporalis wasting.  Ear/Nose/Throat: Hearing grossly intact, nares w/o erythema or drainage Eyes: PER, EOMI, sclera nonicteric.  Neck: Supple, no masses.  No JVD.  Pulmonary:  Good air movement, no use of accessory muscles.  Cardiac: RRR Vascular:  Vessel Right Left  Radial Palpable Palpable   Gastrointestinal: soft, non-distended. No guarding/no peritoneal signs.  Musculoskeletal: M/S 5/5 throughout.  No deformity or atrophy.  Neurologic: Pain and light touch intact in extremities.  Symmetrical.  Speech is fluent. Motor exam as listed above. Psychiatric: Judgment intact, Mood & affect appropriate for pt's clinical situation. Dermatologic: No Venous rashes. No Ulcers Noted.  Stump   Is red tender and painful.  Lymph : No Cervical lymphadenopathy, no lichenification or skin changes of chronic lymphedema.       ASSESSMENT AND PLAN:  1. BKA stump complication ([place]) Stump is red tender and painful.  Irritation versus cellulitis.  Noninvasive studies show adequate blood flow.  See below.  2. Essential hypertension Continue antihypertensive medications as already ordered, these medications have been reviewed and there are no changes at this time.   3. Type 2 diabetes mellitus treated with insulin ([place]) Continue hypoglycemic medications as already ordered, these medications have been reviewed and there are no changes at this time.  Hgb A1C to be monitored as already arranged by primary service   4. Cellulitis of left lower extremity Patient stump is rather erythematous with focal pain spots.  We will treat him with 10 days of doxycycline as cellulitis could indeed cause pain when trying to ambulate with his prosthetic.  If he completes the course of doxycycline and still has pain within his prosthetic with ambulation, it is likely due to issues with the prosthetic itself.  At which time he will be referred back to the manufacturer for troubleshooting.   Current Outpatient Medications  on File Prior to Visit  Medication Sig Dispense Refill  . amiodarone (PACERONE) 400 MG tablet Take 1 tablet (400 mg total) by  mouth 2 (two) times daily. 60 tablet 0  . atorvastatin (LIPITOR) 80 MG tablet Take 80 mg by mouth at bedtime.     . empagliflozin (JARDIANCE) 25 MG TABS tablet Take 25 mg by mouth daily.    . furosemide (LASIX) 20 MG tablet Take 1 tablet (20 mg total) by mouth daily. 30 tablet 2  . insulin glargine (LANTUS) 100 UNIT/ML injection Inject 0.1 mLs (10 Units total) into the skin at bedtime. (Patient taking differently: Inject 15 Units into the skin at bedtime. ) 10 mL 11  . insulin lispro (HUMALOG) 100 UNIT/ML injection Inject 0.03 mLs (3 Units total) into the skin 3 (three) times daily with meals. (Patient taking differently: Inject 3 Units into the skin 3 (three) times daily with meals. Plus 1 additional unit for every 50 BG points over 200 (max 5 additional units per dose)) 10 mL 0  . metoprolol tartrate (LOPRESSOR) 25 MG tablet Take 1 tablet (25 mg total) by mouth 3 (three) times daily. 90 tablet 0  . nitroGLYCERIN (NITROSTAT) 0.4 MG SL tablet Place 1 tablet (0.4 mg total) under the tongue every 5 (five) minutes as needed for chest pain. 20 tablet 0  . potassium chloride SA (K-DUR,KLOR-CON) 20 MEQ tablet Take 20 mEq by mouth daily.    . ranolazine (RANEXA) 500 MG 12 hr tablet Take 1 tablet (500 mg total) by mouth 2 (two) times daily. 60 tablet 0   No current facility-administered medications on file prior to visit.     There are no Patient Instructions on file for this visit. No follow-ups on file.   Kris Hartmann, NP  This note was completed with Sales executive.  Any errors are purely unintentional.

## 2018-07-28 ENCOUNTER — Encounter (INDEPENDENT_AMBULATORY_CARE_PROVIDER_SITE_OTHER): Payer: Self-pay | Admitting: Nurse Practitioner

## 2018-08-11 ENCOUNTER — Encounter (INDEPENDENT_AMBULATORY_CARE_PROVIDER_SITE_OTHER): Payer: Self-pay | Admitting: Vascular Surgery

## 2018-08-11 ENCOUNTER — Other Ambulatory Visit: Payer: Self-pay

## 2018-08-11 ENCOUNTER — Ambulatory Visit (INDEPENDENT_AMBULATORY_CARE_PROVIDER_SITE_OTHER): Payer: Medicare Other | Admitting: Vascular Surgery

## 2018-08-11 VITALS — BP 157/72 | HR 50 | Resp 16 | Wt 175.0 lb

## 2018-08-11 DIAGNOSIS — Z794 Long term (current) use of insulin: Secondary | ICD-10-CM

## 2018-08-11 DIAGNOSIS — I10 Essential (primary) hypertension: Secondary | ICD-10-CM

## 2018-08-11 DIAGNOSIS — I482 Chronic atrial fibrillation, unspecified: Secondary | ICD-10-CM | POA: Diagnosis not present

## 2018-08-11 DIAGNOSIS — E785 Hyperlipidemia, unspecified: Secondary | ICD-10-CM

## 2018-08-11 DIAGNOSIS — Z87891 Personal history of nicotine dependence: Secondary | ICD-10-CM

## 2018-08-11 DIAGNOSIS — E119 Type 2 diabetes mellitus without complications: Secondary | ICD-10-CM | POA: Diagnosis not present

## 2018-08-11 DIAGNOSIS — Z7901 Long term (current) use of anticoagulants: Secondary | ICD-10-CM

## 2018-08-11 DIAGNOSIS — Z89512 Acquired absence of left leg below knee: Secondary | ICD-10-CM | POA: Diagnosis not present

## 2018-08-11 NOTE — Assessment & Plan Note (Signed)
The patient continues to have pain with his new prosthesis.  It has rubbed a raw area on the medial aspect of the old wound that has been healed for a long time.  There is no infection.  He was given a trial of antibiotics with no improvement which is not surprising given the fact that this is not an infectious process.  His perfusion was checked and was found to be okay.  This is clearly a pressure issue and some sort of adjustment will need to be made to his prosthesis.

## 2018-08-11 NOTE — Progress Notes (Signed)
MRN : [id number]  [name] is a 84 y.o. (Nov 18, 1934) male who presents with chief complaint of  Chief Complaint  Patient presents with  . Follow-up    2week wound check  .  History of Present Illness: Patient returns today in follow up of his left below-knee amputation site. The patient continues to have pain with his new prosthesis.  It has rubbed a raw area on the medial aspect of the old wound that has been healed for a long time.  He was given antibiotics with no improvement.  There is no infection present.  His perfusion was checked and was found to be okay.  He says the prosthesis is extremely tight and very uncomfortable.   Current Outpatient Medications  Medication Sig Dispense Refill  . amiodarone (PACERONE) 400 MG tablet Take 1 tablet (400 mg total) by mouth 2 (two) times daily. 60 tablet 0  . atorvastatin (LIPITOR) 80 MG tablet Take 80 mg by mouth at bedtime.     [place] Kitchen doxycycline (VIBRAMYCIN) 100 MG capsule Take 1 capsule (100 mg total) by mouth 2 (two) times daily. 20 capsule 0  . empagliflozin (JARDIANCE) 25 MG TABS tablet Take 25 mg by mouth daily.    . furosemide (LASIX) 20 MG tablet Take 1 tablet (20 mg total) by mouth daily. 30 tablet 2  . insulin glargine (LANTUS) 100 UNIT/ML injection Inject 0.1 mLs (10 Units total) into the skin at bedtime. (Patient taking differently: Inject 15 Units into the skin at bedtime. ) 10 mL 11  . insulin lispro (HUMALOG) 100 UNIT/ML injection Inject 0.03 mLs (3 Units total) into the skin 3 (three) times daily with meals. (Patient taking differently: Inject 3 Units into the skin 3 (three) times daily with meals. Plus 1 additional unit for every 50 BG points over 200 (max 5 additional units per dose)) 10 mL 0  . metoprolol tartrate (LOPRESSOR) 25 MG tablet Take 1 tablet (25 mg total) by mouth 3 (three) times daily. 90 tablet 0  . nitroGLYCERIN (NITROSTAT) 0.4 MG SL tablet Place 1 tablet (0.4 mg total) under the tongue every 5 (five)  minutes as needed for chest pain. 20 tablet 0  . potassium chloride SA (K-DUR,KLOR-CON) 20 MEQ tablet Take 20 mEq by mouth daily.    . ranolazine (RANEXA) 500 MG 12 hr tablet Take 1 tablet (500 mg total) by mouth 2 (two) times daily. 60 tablet 0   No current facility-administered medications for this visit.     Past Medical History:  Diagnosis Date  . Arthritis   . Atrial fibrillation ([place])   . Carcinoma of prostate ([place])   . CHF (congestive heart failure) ([place])   . Chronic kidney disease   . Diabetes mellitus without complication ([place])   . ED (erectile dysfunction)   . Frequent urination   . Hyperlipidemia   . Hypertension   . Moderate mitral insufficiency   . Peripheral vascular disease ([place])   . Pneumonia 04/2016  . Prostate cancer ([place])   . Sleep apnea    OSA--USE C-PAP  . Ulcer of left foot due to type 2 diabetes mellitus ([place])   . Urinary stress incontinence, male     Past Surgical History:  Procedure Laterality Date  . AMPUTATION Left 01/12/2017   Procedure: AMPUTATION BELOW KNEE;  Surgeon: Algernon Huxley, MD;  Location: [facility];  Service: General;  Laterality: Left;  . APLIGRAFT PLACEMENT Left 10/15/2016   Procedure: APLIGRAFT PLACEMENT;  Surgeon: Albertine Patricia, DPM;  Location: [facility];  Service: Podiatry;  Laterality: Left;  necrotic ulcer  . CHOLECYSTECTOMY    . EYE SURGERY Bilateral    Cataract Extraction with IOL  . IRRIGATION AND DEBRIDEMENT FOOT Left 10/15/2016   Procedure: IRRIGATION AND DEBRIDEMENT FOOT-EXCISIONAL DEBRIDEMENT OF SKIN 3RD, 4TH AND 5TH TOES WITH APPLICATION OF APLIGRAFT;  Surgeon: Albertine Patricia, DPM;  Location: [facility];  Service: Podiatry;  Laterality: Left;  necrotic,gangrene  . IRRIGATION AND DEBRIDEMENT FOOT Left 12/09/2016   Procedure: IRRIGATION AND DEBRIDEMENT FOOT-MUSCLE/FASCIA, RESECTION OF FOURTH AND FIFTH METATARSAL NECROTIC BONE;  Surgeon: Albertine Patricia, DPM;  Location: [facility];  Service: Podiatry;  Laterality: Left;  . LEFT  HEART CATH AND CORONARY ANGIOGRAPHY N/A 06/05/2018   Procedure: LEFT HEART CATH AND CORONARY ANGIOGRAPHY;  Surgeon: Yolonda Kida, MD;  Location: [place] [facility];  Service: Cardiovascular;  Laterality: N/A;  . LOWER EXTREMITY ANGIOGRAPHY Left 08/16/2016   Procedure: Lower Extremity Angiography;  Surgeon: Algernon Huxley, MD;  Location: [place] [facility];  Service: Cardiovascular;  Laterality: Left;  . LOWER EXTREMITY ANGIOGRAPHY Left 09/01/2016   Procedure: Lower Extremity Angiography;  Surgeon: Algernon Huxley, MD;  Location: [place] [facility];  Service: Cardiovascular;  Laterality: Left;  . LOWER EXTREMITY ANGIOGRAPHY Left 10/14/2016   Procedure: Lower Extremity Angiography;  Surgeon: Algernon Huxley, MD;  Location: [place] [facility];  Service: Cardiovascular;  Laterality: Left;  . LOWER EXTREMITY ANGIOGRAPHY Left 12/20/2016   Procedure: Lower Extremity Angiography;  Surgeon: Algernon Huxley, MD;  Location: [place] [facility];  Service: Cardiovascular;  Laterality: Left;  . LOWER EXTREMITY INTERVENTION  09/01/2016   Procedure: Lower Extremity Intervention;  Surgeon: Algernon Huxley, MD;  Location: [place] [facility];  Service: Cardiovascular;;  . PROSTATE SURGERY     removal  . TONSILLECTOMY     as a child         Family History  Problem Relation Age of Onset  . Prostate cancer Neg Hx   . Bladder Cancer Neg Hx   . Kidney disease Neg Hx   No bleeding disorders, clotting disorders, autoimmune diseases, or aneurysms  Social History      Social History  Substance Use Topics  . Smoking status: Former Smoker    Quit date: 10/14/1994  . Smokeless tobacco: Never Used  . Alcohol use No  Married, wife accompanies today        Allergies  Allergen Reactions  . Contrast Media [Iodinated Diagnostic Agents]     SOB  . Iohexol     Desc: Respiratory Distress, Laryngedema, diaphoresis, Onset Date: [id number]   . Latex Rash       REVIEW OF  SYSTEMS(Negative unless checked)  Constitutional: [] ??Weight loss[] ??Fever[] ??Chills Cardiac:[] ??Chest pain[] ??Chest pressure[x] ??Palpitations [] ??Shortness of breath when laying flat [] ??Shortness of breath at rest [x] ??Shortness of breath with exertion. Vascular: [x] ??Pain in legs with walking[] ??Pain in legsat rest[] ??Pain in legs when laying flat [] ??Claudication [x] ??Pain in feet when walking [x] ??Pain in feet at rest [x] ??Pain in feet when laying flat [] ??History of DVT [] ??Phlebitis [x] ??Swelling in legs [] ??Varicose veins [x] ??Non-healing ulcers Pulmonary: [] ??Uses home oxygen [x] ??Productive cough[] ??Hemoptysis [] ??Wheeze [] ??COPD [] ??Asthma Neurologic: [] ??Dizziness [] ??Blackouts [] ??Seizures [] ??History of stroke [] ??History of TIA[] ??Aphasia [] ??Temporary blindness[] ??Dysphagia [] ??Weaknessor numbness in arms [] ??Weakness or numbnessin legs Musculoskeletal: [x] ??Arthritis [] ??Joint swelling [] ??Joint pain [] ??Low back pain Hematologic:[] ??Easy bruising[] ??Easy bleeding [] ??Hypercoagulable state [] ??Anemic [] ??Hepatitis Gastrointestinal:[] ??Blood in stool[] ??Vomiting blood[] ??Gastroesophageal reflux/heartburn[] ??Abdominal pain Genitourinary: [] ??Chronic kidney disease [] ??Difficulturination [] ??Frequenturination [] ??Burning with urination[] ??Hematuria Skin: [] ??Rashes [x] ??Ulcers [x] ??Wounds Psychological: [] ??History of anxiety[] ??History of major depression.  Physical Examination  BP (!) 157/72 (BP Location: Right Arm)   Pulse (!) 50   Resp 16   Wt 175 lb (79.4 kg)   BMI 27.41 kg/m  Gen:  WD/WN, NAD Head: [place]/AT, No temporalis wasting. Ear/Nose/Throat: Hearing grossly intact, nares w/o erythema or drainage Eyes: Conjunctiva clear. Sclera non-icteric Neck: Supple.  Trachea midline Pulmonary:  Good air movement, no use of accessory muscles.  Cardiac:  Irregular Vascular:  Vessel Right Left  Radial Palpable Palpable                          PT  1+ palpable  not palpable  DP  1+ palpable  not palpable    Musculoskeletal: M/S 5/5 throughout.  Left BKA with a small scab on the medial aspect of the wound from pressure.  No erythema.  No drainage. Neurologic: Sensation grossly intact in extremities.  Symmetrical.  Speech is fluent.  Psychiatric: Judgment intact, Mood & affect appropriate for pt's clinical situation. Dermatologic: No rashes or ulcers noted.  No cellulitis or open wounds.       Labs Recent Results (from the past 2160 hour(s))  Basic metabolic panel     Status: Abnormal   Collection Time: 06/02/18 10:33 PM  Result Value Ref Range   Sodium 133 (L) 135 - 145 mmol/L   Potassium 4.1 3.5 - 5.1 mmol/L    Comment: HEMOLYSIS AT THIS LEVEL MAY AFFECT RESULT   Chloride 96 (L) 98 - 111 mmol/L   CO2 24 22 - 32 mmol/L   Glucose, Bld 517 (HH) 70 - 99 mg/dL    Comment: CRITICAL RESULT CALLED TO, READ BACK BY AND VERIFIED WITH LORRIE LEMONS ON 06/03/18 AT 0004 BY JAG    BUN 22 8 - 23 mg/dL   Creatinine, Ser 1.33 (H) 0.61 - 1.24 mg/dL   Calcium 8.5 (L) 8.9 - 10.3 mg/dL   GFR calc non Af Amer 50 (L) >60 mL/min   GFR calc Af Amer 58 (L) >60 mL/min   Anion gap 13 5 - 15    Comment: Performed at [facility], [address] [place], [place] [address]  CBC     Status: Abnormal   Collection Time: 06/02/18 10:33 PM  Result Value Ref Range   WBC 12.1 (H) 4.0 - 10.5 K/uL   RBC 6.18 (H) 4.22 - 5.81 MIL/uL   Hemoglobin 16.3 13.0 - 17.0 g/dL   HCT 50.4 39.0 - 52.0 %   MCV 81.6 80.0 - 100.0 fL   MCH 26.4 26.0 - 34.0 pg   MCHC 32.3 30.0 - 36.0 g/dL   RDW 16.1 (H) 11.5 - 15.5 %   Platelets 278 150 - 400 K/uL   nRBC 0.0 0.0 - 0.2 %    Comment: Performed at [facility], [address] [place], [place] [address]  Troponin I - Once     Status: Abnormal   Collection Time: 06/02/18 10:33 PM  Result Value Ref  Range   Troponin I 0.07 (HH) <0.03 ng/mL    Comment: CRITICAL RESULT CALLED TO, READ BACK BY AND VERIFIED WITH LORRIE LEMONS ON 06/03/18 AT 0004 BY JAG Performed at [facility], [address] [place], [place] [address]   Glucose, capillary     Status: Abnormal   Collection Time: 06/03/18  1:38 AM  Result Value Ref Range   Glucose-Capillary 339 (H) 70 - 99 mg/dL  Glucose, capillary     Status: Abnormal  Collection Time: 06/03/18  2:34 AM  Result Value Ref Range   Glucose-Capillary 314 (H) 70 - 99 mg/dL   Comment 1 Notify RN    Comment 2 Document in Chart   MRSA PCR Screening     Status: None   Collection Time: 06/03/18  3:02 AM  Result Value Ref Range   MRSA by PCR NEGATIVE NEGATIVE    Comment:        The GeneXpert MRSA Assay (FDA approved for NASAL specimens only), is one component of a comprehensive MRSA colonization surveillance program. It is not intended to diagnose MRSA infection nor to guide or monitor treatment for MRSA infections. Performed at [facility], [address] [place], [place] [address]   Troponin I - Now Then Q6H     Status: Abnormal   Collection Time: 06/03/18  3:47 AM  Result Value Ref Range   Troponin I 2.48 (HH) <0.03 ng/mL    Comment: CRITICAL RESULT CALLED TO, READ BACK BY AND VERIFIED WITH JASMINE HOOD ON 06/03/18 AT [place] JAG Performed at [facility], [address] [place], [place] [address]   Basic metabolic panel     Status: Abnormal   Collection Time: 06/03/18  3:47 AM  Result Value Ref Range   Sodium 136 135 - 145 mmol/L   Potassium 4.1 3.5 - 5.1 mmol/L   Chloride 103 98 - 111 mmol/L   CO2 23 22 - 32 mmol/L   Glucose, Bld 330 (H) 70 - 99 mg/dL   BUN 23 8 - 23 mg/dL   Creatinine, Ser 1.19 0.61 - 1.24 mg/dL   Calcium 8.6 (L) 8.9 - 10.3 mg/dL   GFR calc non Af Amer 57 (L) >60 mL/min   GFR calc Af Amer >60 >60 mL/min   Anion gap 10 5 - 15    Comment: Performed at [facility], [address] [place], [place] [address]  CBC     Status: Abnormal   Collection Time: 06/03/18  3:47 AM  Result Value Ref Range   WBC 10.6 (H) 4.0 - 10.5 K/uL   RBC 5.50 4.22 - 5.81 MIL/uL   Hemoglobin 14.7 13.0 - 17.0 g/dL   HCT 45.4 39.0 - 52.0 %   MCV 82.5 80.0 - 100.0 fL   MCH 26.7 26.0 - 34.0 pg   MCHC 32.4 30.0 - 36.0 g/dL   RDW 15.3 11.5 - 15.5 %   Platelets 268 150 - 400 K/uL   nRBC 0.0 0.0 - 0.2 %    Comment: Performed at [facility], [address] [place], [place] [address]  Glucose, capillary     Status: Abnormal   Collection Time: 06/03/18  4:00 AM  Result Value Ref Range   Glucose-Capillary 292 (H) 70 - 99 mg/dL  Glucose, capillary     Status: Abnormal   Collection Time: 06/03/18  7:31 AM  Result Value Ref Range   Glucose-Capillary 177 (H) 70 - 99 mg/dL   Comment 1 Notify RN    Comment 2 Document in Chart   Troponin I - Now Then Q6H     Status: Abnormal   Collection Time: 06/03/18  9:27 AM  Result Value Ref Range   Troponin I 2.76 (HH) <0.03 ng/mL    Comment: CRITICAL VALUE NOTED. VALUE IS CONSISTENT WITH PREVIOUSLY REPORTED/CALLED VALUE [facility] Performed at [facility], [address] [place], [place] [address]   APTT     Status: None   Collection Time: 06/03/18  10:06 AM  Result Value Ref Range   aPTT 33 24 - 36 seconds    Comment: Performed at [facility], [address] [place], [place] [address]  Protime-INR     Status: Abnormal   Collection Time: 06/03/18 10:06 AM  Result Value Ref Range   Prothrombin Time 18.7 (H) 11.4 - 15.2 seconds   INR 1.58     Comment: Performed at [facility], [place], [place] [address]  Heparin level (unfractionated)     Status: Abnormal   Collection Time: 06/03/18 10:06 AM  Result Value Ref Range   Heparin Unfractionated 1.57 (H) 0.30 - 0.70 IU/mL    Comment: (NOTE) If heparin results are below expected values, and patient dosage has  been confirmed, suggest follow up  testing of antithrombin III levels. Performed at [facility], [address] [place], [place] [address]   Glucose, capillary     Status: Abnormal   Collection Time: 06/03/18 11:29 AM  Result Value Ref Range   Glucose-Capillary 290 (H) 70 - 99 mg/dL   Comment 1 Notify RN    Comment 2 Document in Chart   ECHOCARDIOGRAM COMPLETE     Status: None   Collection Time: 06/03/18 11:29 AM  Result Value Ref Range   Weight 2,824 oz   Height 67 in   BP 148/69 mmHg  Troponin I - Now Then Q6H     Status: Abnormal   Collection Time: 06/03/18  3:00 PM  Result Value Ref Range   Troponin I 2.34 (HH) <0.03 ng/mL    Comment: CRITICAL VALUE NOTED. VALUE IS CONSISTENT WITH PREVIOUSLY REPORTED/CALLED VALUE.MSS Performed at [facility], [address] [place], [place] [address]   Glucose, capillary     Status: Abnormal   Collection Time: 06/03/18  4:36 PM  Result Value Ref Range   Glucose-Capillary 223 (H) 70 - 99 mg/dL   Comment 1 Notify RN    Comment 2 Document in Chart   APTT     Status: Abnormal   Collection Time: 06/03/18  6:55 PM  Result Value Ref Range   aPTT 58 (H) 24 - 36 seconds    Comment:        IF BASELINE aPTT IS ELEVATED, SUGGEST PATIENT RISK ASSESSMENT BE USED TO DETERMINE APPROPRIATE ANTICOAGULANT THERAPY. Performed at [facility], [address] [place], [place] [address]   Glucose, capillary     Status: Abnormal   Collection Time: 06/03/18  9:10 PM  Result Value Ref Range   Glucose-Capillary 310 (H) 70 - 99 mg/dL  CBC     Status: Abnormal   Collection Time: 06/04/18  2:54 AM  Result Value Ref Range   WBC 9.0 4.0 - 10.5 K/uL   RBC 5.59 4.22 - 5.81 MIL/uL   Hemoglobin 14.7 13.0 - 17.0 g/dL   HCT 46.4 39.0 - 52.0 %   MCV 83.0 80.0 - 100.0 fL   MCH 26.3 26.0 - 34.0 pg   MCHC 31.7 30.0 - 36.0 g/dL   RDW 15.8 (H) 11.5 - 15.5 %   Platelets 262 150 - 400 K/uL   nRBC 0.0 0.0 - 0.2 %    Comment: Performed at [facility], [address] [place], [place] [address]  Heparin level (unfractionated)     Status: Abnormal   Collection Time: 06/04/18  2:54 AM  Result Value Ref Range   Heparin Unfractionated 1.22 (H) 0.30 - 0.70 IU/mL    Comment:  Performed at [facility], [address] [place], [place] [address]  APTT     Status: Abnormal   Collection Time: 06/04/18  2:54 AM  Result Value Ref Range   aPTT 83 (H) 24 - 36 seconds    Comment:        IF BASELINE aPTT IS ELEVATED, SUGGEST PATIENT RISK ASSESSMENT BE USED TO DETERMINE APPROPRIATE ANTICOAGULANT THERAPY. Performed at [facility], [address] [place], [place] [address]   Glucose, capillary     Status: Abnormal   Collection Time: 06/04/18  7:52 AM  Result Value Ref Range   Glucose-Capillary 171 (H) 70 - 99 mg/dL   Comment 1 Notify RN    Comment 2 Document in Chart   Glucose, capillary     Status: Abnormal   Collection Time: 06/04/18 11:32 AM  Result Value Ref Range   Glucose-Capillary 167 (H) 70 - 99 mg/dL   Comment 1 Notify RN    Comment 2 Document in Chart   Glucose, capillary     Status: Abnormal   Collection Time: 06/04/18  4:13 PM  Result Value Ref Range   Glucose-Capillary 337 (H) 70 - 99 mg/dL  Glucose, capillary     Status: Abnormal   Collection Time: 06/04/18  8:56 PM  Result Value Ref Range   Glucose-Capillary 146 (H) 70 - 99 mg/dL  APTT     Status: Abnormal   Collection Time: 06/05/18  5:54 AM  Result Value Ref Range   aPTT 101 (H) 24 - 36 seconds    Comment:        IF BASELINE aPTT IS ELEVATED, SUGGEST PATIENT RISK ASSESSMENT BE USED TO DETERMINE APPROPRIATE ANTICOAGULANT THERAPY. Performed at [facility], [address] [place], [place] [address]   CBC     Status: Abnormal   Collection Time: 06/05/18  5:54 AM  Result Value Ref Range   WBC 9.3 4.0 - 10.5 K/uL   RBC 5.58 4.22 - 5.81 MIL/uL   Hemoglobin 14.6 13.0 - 17.0 g/dL   HCT 45.5 39.0 - 52.0 %   MCV 81.5 80.0 - 100.0 fL   MCH  26.2 26.0 - 34.0 pg   MCHC 32.1 30.0 - 36.0 g/dL   RDW 15.6 (H) 11.5 - 15.5 %   Platelets 221 150 - 400 K/uL   nRBC 0.0 0.0 - 0.2 %    Comment: Performed at [facility], [place], [place] [address]  Heparin level (unfractionated)     Status: Abnormal   Collection Time: 06/05/18  5:54 AM  Result Value Ref Range   Heparin Unfractionated 1.02 (H) 0.30 - 0.70 IU/mL    Comment: (NOTE) If heparin results are below expected values, and patient dosage has  been confirmed, suggest follow up testing of antithrombin III levels. Performed at [facility], [address] [place], [place] [address]   Hemoglobin A1c     Status: Abnormal   Collection Time: 06/05/18  5:54 AM  Result Value Ref Range   Hgb A1c MFr Bld 10.7 (H) 4.8 - 5.6 %    Comment: (NOTE) Pre diabetes:          5.7%-6.4% Diabetes:              >6.4% Glycemic control for   <7.0% adults with diabetes    Mean Plasma Glucose 260.39 mg/dL    Comment: Performed at [facility] [address], [place], [place] [address]  Glucose, capillary  Status: Abnormal   Collection Time: 06/05/18  7:28 AM  Result Value Ref Range   Glucose-Capillary 188 (H) 70 - 99 mg/dL  Glucose, capillary     Status: Abnormal   Collection Time: 06/05/18 10:07 AM  Result Value Ref Range   Glucose-Capillary 174 (H) 70 - 99 mg/dL  Glucose, capillary     Status: Abnormal   Collection Time: 06/05/18 12:32 PM  Result Value Ref Range   Glucose-Capillary 185 (H) 70 - 99 mg/dL  Glucose, capillary     Status: Abnormal   Collection Time: 06/05/18  6:00 PM  Result Value Ref Range   Glucose-Capillary 152 (H) 70 - 99 mg/dL  Glucose, capillary     Status: Abnormal   Collection Time: 06/05/18  9:14 PM  Result Value Ref Range   Glucose-Capillary 133 (H) 70 - 99 mg/dL   Comment 1 Notify RN   Glucose, capillary     Status: Abnormal   Collection Time: 06/06/18  7:32 AM  Result Value Ref Range   Glucose-Capillary 106 (H) 70  - 99 mg/dL  Glucose, capillary     Status: Abnormal   Collection Time: 06/06/18 11:37 AM  Result Value Ref Range   Glucose-Capillary 250 (H) 70 - 99 mg/dL  Lipid panel     Status: Abnormal   Collection Time: 06/06/18  2:09 PM  Result Value Ref Range   Cholesterol 146 0 - 200 mg/dL   Triglycerides 104 <150 mg/dL   HDL 30 (L) >40 mg/dL   Total CHOL/HDL Ratio 4.9 RATIO   VLDL 21 0 - 40 mg/dL   LDL Cholesterol 95 0 - 99 mg/dL    Comment:        Total Cholesterol/HDL:CHD Risk Coronary Heart Disease Risk Table                     Men   Women  1/2 Average Risk   3.4   3.3  Average Risk       5.0   4.4  2 X Average Risk   9.6   7.1  3 X Average Risk  23.4   11.0        Use the calculated Patient Ratio above and the CHD Risk Table to determine the patient's CHD Risk.        ATP III CLASSIFICATION (LDL):  <100     mg/dL   Optimal  100-129  mg/dL   Near or Above                    Optimal  130-159  mg/dL   Borderline  160-189  mg/dL   High  >190     mg/dL   Very High Performed at [facility], [address] [place], [place] [address]   CBC     Status: Abnormal   Collection Time: 06/06/18  2:09 PM  Result Value Ref Range   WBC 11.0 (H) 4.0 - 10.5 K/uL   RBC 5.47 4.22 - 5.81 MIL/uL   Hemoglobin 14.5 13.0 - 17.0 g/dL   HCT 45.3 39.0 - 52.0 %   MCV 82.8 80.0 - 100.0 fL   MCH 26.5 26.0 - 34.0 pg   MCHC 32.0 30.0 - 36.0 g/dL   RDW 15.5 11.5 - 15.5 %   Platelets 237 150 - 400 K/uL   nRBC 0.0 0.0 - 0.2 %    Comment: Performed at [facility], [address], [place], [place] [address]  Glucose, capillary     Status: Abnormal   Collection Time: 06/06/18  4:17 PM  Result Value Ref Range   Glucose-Capillary 232 (H) 70 - 99 mg/dL  Glucose, capillary     Status: Abnormal   Collection Time: 06/06/18  9:40 PM  Result Value Ref Range   Glucose-Capillary 165 (H) 70 - 99 mg/dL  CBC     Status: Abnormal   Collection Time: 06/07/18  2:26 AM  Result Value Ref Range    WBC 11.1 (H) 4.0 - 10.5 K/uL   RBC 5.25 4.22 - 5.81 MIL/uL   Hemoglobin 14.0 13.0 - 17.0 g/dL   HCT 43.3 39.0 - 52.0 %   MCV 82.5 80.0 - 100.0 fL   MCH 26.7 26.0 - 34.0 pg   MCHC 32.3 30.0 - 36.0 g/dL   RDW 15.5 11.5 - 15.5 %   Platelets 214 150 - 400 K/uL   nRBC 0.0 0.0 - 0.2 %    Comment: Performed at [facility], [address] [place], [place] [address]  Basic metabolic panel     Status: Abnormal   Collection Time: 06/07/18  2:26 AM  Result Value Ref Range   Sodium 137 135 - 145 mmol/L   Potassium 3.8 3.5 - 5.1 mmol/L   Chloride 108 98 - 111 mmol/L   CO2 24 22 - 32 mmol/L   Glucose, Bld 144 (H) 70 - 99 mg/dL   BUN 37 (H) 8 - 23 mg/dL   Creatinine, Ser 1.20 0.61 - 1.24 mg/dL   Calcium 8.0 (L) 8.9 - 10.3 mg/dL   GFR calc non Af Amer 56 (L) >60 mL/min   GFR calc Af Amer >60 >60 mL/min   Anion gap 5 5 - 15    Comment: Performed at [facility], [address] [place], [place] [address]  Troponin I - ONCE - STAT     Status: Abnormal   Collection Time: 06/07/18  2:26 AM  Result Value Ref Range   Troponin I 0.26 (HH) <0.03 ng/mL    Comment: CRITICAL VALUE NOTED. VALUE IS CONSISTENT WITH PREVIOUSLY REPORTED/CALLED VALUE AKT Performed at [facility], [address] [place], [place] [address]   Magnesium     Status: None   Collection Time: 06/07/18  2:26 AM  Result Value Ref Range   Magnesium 2.2 1.7 - 2.4 mg/dL    Comment: Performed at [facility], [address] [place],  [address]  Glucose, capillary     Status: Abnormal   Collection Time: 06/07/18  7:24 AM  Result Value Ref Range   Glucose-Capillary 138 (H) 70 - 99 mg/dL  Glucose, capillary     Status: Abnormal   Collection Time: 06/07/18 11:31 AM  Result Value Ref Range   Glucose-Capillary 226 (H) 70 - 99 mg/dL  Glucose, capillary     Status: Abnormal   Collection Time: 06/07/18  4:43 PM  Result Value Ref Range   Glucose-Capillary 272 (H) 70 - 99 mg/dL  Glucose,  capillary     Status: Abnormal   Collection Time: 06/07/18  9:29 PM  Result Value Ref Range   Glucose-Capillary 173 (H) 70 - 99 mg/dL   Comment 1 Notify RN   Glucose, capillary     Status: Abnormal   Collection Time: 06/08/18  7:32 AM  Result Value Ref Range   Glucose-Capillary 193 (H) 70 - 99 mg/dL  Glucose, capillary     Status: Abnormal   Collection Time: 06/08/18 11:49  AM  Result Value Ref Range   Glucose-Capillary 281 (H) 70 - 99 mg/dL  Glucose, capillary     Status: Abnormal   Collection Time: 06/08/18  4:28 PM  Result Value Ref Range   Glucose-Capillary 203 (H) 70 - 99 mg/dL  Glucose, capillary     Status: Abnormal   Collection Time: 06/08/18  9:25 PM  Result Value Ref Range   Glucose-Capillary 202 (H) 70 - 99 mg/dL  Glucose, capillary     Status: Abnormal   Collection Time: 06/09/18  7:38 AM  Result Value Ref Range   Glucose-Capillary 205 (H) 70 - 99 mg/dL  Glucose, capillary     Status: Abnormal   Collection Time: 06/09/18 11:58 AM  Result Value Ref Range   Glucose-Capillary 284 (H) 70 - 99 mg/dL    Radiology Vas [place] Lower Extremity Arterial Duplex  Result Date: 07/28/2018 LOWER EXTREMITY ARTERIAL DUPLEX STUDY  Vascular Interventions: 2018 Left SFA/popliteal artery PTA / stents x3 with                         subsequent BKA. Current ABI:            N/A Performing Technologist: Charlane Ferretti RT (R)(VS)  Examination Guidelines: A complete evaluation includes B-mode imaging, spectral Doppler, color Doppler, and power Doppler as needed of all accessible portions of each vessel. Bilateral testing is considered an integral part of a complete examination. Limited examinations for reoccurring indications may be performed as noted.  Right Duplex Findings:  Left Duplex Findings: +----------+--------+-----+--------+--------+--------+           PSV cm/sRatioStenosisWaveformComments +----------+--------+-----+--------+--------+--------+ CFA Prox  80                    biphasic         +----------+--------+-----+--------+--------+--------+ DFA       102                  biphasic         +----------+--------+-----+--------+--------+--------+ SFA Prox               occluded                 +----------+--------+-----+--------+--------+--------+ SFA Mid                occluded                 +----------+--------+-----+--------+--------+--------+ SFA Distal             occluded                 +----------+--------+-----+--------+--------+--------+ POP Prox               occluded                 +----------+--------+-----+--------+--------+--------+ POP Distal             occluded                 +----------+--------+-----+--------+--------+--------+  Summary: Left: Total occlusion in the left SFA proximal to distal and the popliteal arteries.  See table(s) above for measurements and observations. Electronically signed by Leotis Pain MD on 07/28/2018 at 9:41:45 AM.    Final     Assessment/Plan Hyperlipidemia lipid control important in reducing the progression of atherosclerotic disease. Continue statin therapy   Type 2 diabetes mellitus treated with insulin (HCC) blood glucose control important in reducing the progression of atherosclerotic disease. Also, involved in  wound healing. On appropriate medications.   Essential hypertension blood pressure control important in reducing the progression of atherosclerotic disease. On appropriate oral medications.  A-fib (HCC) On anticoagulation. Rapid A. fib lead to a prolonged hospitalization lastyear.  S/P BKA (below knee amputation) unilateral, left ([place]) The patient continues to have pain with his new prosthesis.  It has rubbed a raw area on the medial aspect of the old wound that has been healed for a long time.  There is no infection.  He was given a trial of antibiotics with no improvement which is not surprising given the fact that this is not an infectious process.  His  perfusion was checked and was found to be okay.  This is clearly a pressure issue and some sort of adjustment will need to be made to his prosthesis.    Leotis Pain, MD  08/11/2018 1:37 PM    This note was created with Dragon medical transcription system.  Any errors from dictation are purely unintentional

## 2018-10-12 ENCOUNTER — Ambulatory Visit: Payer: Medicare Other | Attending: Vascular Surgery | Admitting: Physical Therapy

## 2018-10-13 ENCOUNTER — Encounter
Admission: EM | Disposition: A | Discharge status: Rehabilitation Facility | Payer: Self-pay | Source: Home / Self Care | Attending: Internal Medicine

## 2018-10-13 ENCOUNTER — Inpatient Hospital Stay: Payer: Medicare Other | Admitting: Anesthesiology

## 2018-10-13 ENCOUNTER — Inpatient Hospital Stay: Payer: Medicare Other

## 2018-10-13 ENCOUNTER — Other Ambulatory Visit: Payer: Self-pay

## 2018-10-13 ENCOUNTER — Encounter: Payer: Self-pay | Admitting: Emergency Medicine

## 2018-10-13 ENCOUNTER — Inpatient Hospital Stay
Admission: EM | Admit: 2018-10-13 | Discharge: 2018-10-18 | DRG: 470 | Disposition: A | Discharge status: Rehabilitation Facility | Payer: Medicare Other | Attending: Internal Medicine | Admitting: Internal Medicine

## 2018-10-13 ENCOUNTER — Emergency Department: Payer: Medicare Other

## 2018-10-13 DIAGNOSIS — Z8546 Personal history of malignant neoplasm of prostate: Secondary | ICD-10-CM | POA: Diagnosis not present

## 2018-10-13 DIAGNOSIS — Z8673 Personal history of transient ischemic attack (TIA), and cerebral infarction without residual deficits: Secondary | ICD-10-CM

## 2018-10-13 DIAGNOSIS — E1151 Type 2 diabetes mellitus with diabetic peripheral angiopathy without gangrene: Secondary | ICD-10-CM | POA: Diagnosis present

## 2018-10-13 DIAGNOSIS — I130 Hypertensive heart and chronic kidney disease with heart failure and stage 1 through stage 4 chronic kidney disease, or unspecified chronic kidney disease: Secondary | ICD-10-CM | POA: Diagnosis present

## 2018-10-13 DIAGNOSIS — I482 Chronic atrial fibrillation, unspecified: Secondary | ICD-10-CM | POA: Diagnosis present

## 2018-10-13 DIAGNOSIS — N183 Chronic kidney disease, stage 3 (moderate): Secondary | ICD-10-CM | POA: Diagnosis present

## 2018-10-13 DIAGNOSIS — S72001A Fracture of unspecified part of neck of right femur, initial encounter for closed fracture: Secondary | ICD-10-CM

## 2018-10-13 DIAGNOSIS — S72011A Unspecified intracapsular fracture of right femur, initial encounter for closed fracture: Principal | ICD-10-CM | POA: Diagnosis present

## 2018-10-13 DIAGNOSIS — Z1159 Encounter for screening for other viral diseases: Secondary | ICD-10-CM

## 2018-10-13 DIAGNOSIS — Y92481 Parking lot as the place of occurrence of the external cause: Secondary | ICD-10-CM

## 2018-10-13 DIAGNOSIS — Y9301 Activity, walking, marching and hiking: Secondary | ICD-10-CM | POA: Diagnosis present

## 2018-10-13 DIAGNOSIS — Z7901 Long term (current) use of anticoagulants: Secondary | ICD-10-CM | POA: Diagnosis not present

## 2018-10-13 DIAGNOSIS — Z87891 Personal history of nicotine dependence: Secondary | ICD-10-CM | POA: Diagnosis not present

## 2018-10-13 DIAGNOSIS — N179 Acute kidney failure, unspecified: Secondary | ICD-10-CM | POA: Diagnosis present

## 2018-10-13 DIAGNOSIS — Z8249 Family history of ischemic heart disease and other diseases of the circulatory system: Secondary | ICD-10-CM | POA: Diagnosis not present

## 2018-10-13 DIAGNOSIS — Z89519 Acquired absence of unspecified leg below knee: Secondary | ICD-10-CM | POA: Diagnosis not present

## 2018-10-13 DIAGNOSIS — I5022 Chronic systolic (congestive) heart failure: Secondary | ICD-10-CM | POA: Diagnosis present

## 2018-10-13 DIAGNOSIS — Z96649 Presence of unspecified artificial hip joint: Secondary | ICD-10-CM

## 2018-10-13 DIAGNOSIS — E1122 Type 2 diabetes mellitus with diabetic chronic kidney disease: Secondary | ICD-10-CM | POA: Diagnosis present

## 2018-10-13 DIAGNOSIS — Z833 Family history of diabetes mellitus: Secondary | ICD-10-CM

## 2018-10-13 DIAGNOSIS — Z79899 Other long term (current) drug therapy: Secondary | ICD-10-CM

## 2018-10-13 DIAGNOSIS — W19XXXA Unspecified fall, initial encounter: Secondary | ICD-10-CM

## 2018-10-13 DIAGNOSIS — Z794 Long term (current) use of insulin: Secondary | ICD-10-CM

## 2018-10-13 DIAGNOSIS — S7290XA Unspecified fracture of unspecified femur, initial encounter for closed fracture: Secondary | ICD-10-CM | POA: Diagnosis present

## 2018-10-13 DIAGNOSIS — M25551 Pain in right hip: Secondary | ICD-10-CM | POA: Diagnosis present

## 2018-10-13 DIAGNOSIS — W1839XA Other fall on same level, initial encounter: Secondary | ICD-10-CM | POA: Diagnosis present

## 2018-10-13 HISTORY — PX: HIP ARTHROPLASTY: SHX981

## 2018-10-13 LAB — URINALYSIS, ROUTINE W REFLEX MICROSCOPIC
Bacteria, UA: NONE SEEN
Bilirubin Urine: NEGATIVE
Glucose, UA: >=500 mg/dL — AB
Hgb urine dipstick: NEGATIVE
Ketones, ur: NEGATIVE mg/dL
Leukocytes,Ua: NEGATIVE
Nitrite: NEGATIVE
Protein, ur: NEGATIVE mg/dL
Specific Gravity, Urine: 1.016 (ref 1.005–1.030)
pH: 6 (ref 5.0–8.0)

## 2018-10-13 LAB — BASIC METABOLIC PANEL
Anion gap: 11 (ref 5–15)
BUN: 38 mg/dL — ABNORMAL HIGH (ref 8–23)
CO2: 24 mmol/L (ref 22–32)
Calcium: 8.9 mg/dL (ref 8.9–10.3)
Chloride: 102 mmol/L (ref 98–111)
Creatinine, Ser: 2.03 mg/dL — ABNORMAL HIGH (ref 0.61–1.24)
GFR calc Af Amer: 35 mL/min — ABNORMAL LOW (ref >60)
GFR calc non Af Amer: 30 mL/min — ABNORMAL LOW (ref >60)
Glucose, Bld: 242 mg/dL — ABNORMAL HIGH (ref 70–99)
Potassium: 4.2 mmol/L (ref 3.5–5.1)
Sodium: 137 mmol/L (ref 135–145)

## 2018-10-13 LAB — CBC
HCT: 43.6 % (ref 39.0–52.0)
Hemoglobin: 15 g/dL (ref 13.0–17.0)
MCH: 29.8 pg (ref 26.0–34.0)
MCHC: 34.4 g/dL (ref 30.0–36.0)
MCV: 86.5 fL (ref 80.0–100.0)
Platelets: 223 10*3/uL (ref 150–400)
RBC: 5.04 MIL/uL (ref 4.22–5.81)
RDW: 14.6 % (ref 11.5–15.5)
WBC: 13.6 10*3/uL — ABNORMAL HIGH (ref 4.0–10.5)
nRBC: 0 % (ref 0.0–0.2)

## 2018-10-13 LAB — APTT: aPTT: 26 seconds (ref 24–36)

## 2018-10-13 LAB — PROTIME-INR
INR: 1.2 (ref 0.8–1.2)
Prothrombin Time: 14.9 seconds (ref 11.4–15.2)

## 2018-10-13 LAB — TYPE AND SCREEN
ABO/RH(D): O POS
Antibody Screen: NEGATIVE

## 2018-10-13 LAB — GLUCOSE, CAPILLARY
Glucose-Capillary: 255 mg/dL — ABNORMAL HIGH (ref 70–99)
Glucose-Capillary: 165 mg/dL — ABNORMAL HIGH (ref 70–99)
Glucose-Capillary: 140 mg/dL — ABNORMAL HIGH (ref 70–99)
Glucose-Capillary: 135 mg/dL — ABNORMAL HIGH (ref 70–99)

## 2018-10-13 LAB — SURGICAL PCR SCREEN
MRSA, PCR: NEGATIVE
Staphylococcus aureus: POSITIVE — AB

## 2018-10-13 LAB — SARS CORONAVIRUS 2 BY RT PCR (HOSPITAL ORDER, PERFORMED IN ~~LOC~~ HOSPITAL LAB): SARS Coronavirus 2: NEGATIVE

## 2018-10-13 SURGERY — HEMIARTHROPLASTY, HIP, DIRECT ANTERIOR APPROACH, FOR FRACTURE
Anesthesia: General | Laterality: Right

## 2018-10-13 MED ORDER — FENTANYL CITRATE (PF) 100 MCG/2ML IJ SOLN
INTRAMUSCULAR | Status: DC | PRN
  Administered 2018-10-13 (×4): 25 ug via INTRAVENOUS

## 2018-10-13 MED ORDER — ATORVASTATIN CALCIUM 20 MG PO TABS
80.0000 mg | ORAL_TABLET | Freq: Every evening | ORAL | Status: DC
  Administered 2018-10-15 – 2018-10-17 (×3): 80 mg via ORAL
  Filled 2018-10-13 (×4): qty 4

## 2018-10-13 MED ORDER — CEFAZOLIN SODIUM-DEXTROSE 1-4 GM/50ML-% IV SOLN
1.0000 g | INTRAVENOUS | Status: AC
  Administered 2018-10-13: 1 g via INTRAVENOUS

## 2018-10-13 MED ORDER — ROCURONIUM BROMIDE 100 MG/10ML IV SOLN
INTRAVENOUS | Status: DC | PRN
  Administered 2018-10-13: 30 mg via INTRAVENOUS
  Administered 2018-10-13 (×2): 10 mg via INTRAVENOUS

## 2018-10-13 MED ORDER — BUPIVACAINE-EPINEPHRINE 0.5% -1:200000 IJ SOLN
INTRAMUSCULAR | Status: DC | PRN
  Administered 2018-10-13: 30 mL

## 2018-10-13 MED ORDER — SODIUM CHLORIDE 0.9 % IV SOLN
INTRAVENOUS | Status: DC | PRN
  Administered 2018-10-13: 50 ug/min via INTRAVENOUS

## 2018-10-13 MED ORDER — INSULIN ASPART 100 UNIT/ML ~~LOC~~ SOLN
0.0000 [IU] | Freq: Every day | SUBCUTANEOUS | Status: DC
  Administered 2018-10-17: 22:00:00 3 [IU] via SUBCUTANEOUS
  Filled 2018-10-13: qty 1

## 2018-10-13 MED ORDER — MORPHINE SULFATE (PF) 2 MG/ML IV SOLN
0.5000 mg | INTRAVENOUS | Status: DC | PRN
  Administered 2018-10-13: 0.5 mg via INTRAVENOUS
  Filled 2018-10-13: qty 1

## 2018-10-13 MED ORDER — HYDRALAZINE HCL 20 MG/ML IJ SOLN
5.0000 mg | Freq: Once | INTRAMUSCULAR | Status: AC
  Administered 2018-10-14: 5 mg via INTRAVENOUS
  Filled 2018-10-13: qty 1

## 2018-10-13 MED ORDER — CLINDAMYCIN PHOSPHATE 600 MG/50ML IV SOLN
600.0000 mg | INTRAVENOUS | Status: AC
  Administered 2018-10-13: 600 mg via INTRAVENOUS

## 2018-10-13 MED ORDER — CELECOXIB 200 MG PO CAPS
200.0000 mg | ORAL_CAPSULE | Freq: Two times a day (BID) | ORAL | Status: DC
  Administered 2018-10-13 – 2018-10-14 (×2): 200 mg via ORAL
  Filled 2018-10-13 (×2): qty 1

## 2018-10-13 MED ORDER — ENOXAPARIN SODIUM 30 MG/0.3ML ~~LOC~~ SOLN: 30.0000 mg | SUBCUTANEOUS | Status: DC

## 2018-10-13 MED ORDER — SODIUM CHLORIDE 0.9 % IV SOLN
Freq: Once | INTRAVENOUS | Status: AC
  Administered 2018-10-13: 10:00:00 via INTRAVENOUS

## 2018-10-13 MED ORDER — RANOLAZINE ER 500 MG PO TB12
500.0000 mg | ORAL_TABLET | Freq: Two times a day (BID) | ORAL | Status: DC
  Administered 2018-10-13 – 2018-10-15 (×4): 500 mg via ORAL
  Filled 2018-10-13 (×5): qty 1

## 2018-10-13 MED ORDER — METOCLOPRAMIDE HCL 5 MG/ML IJ SOLN: 5.0000 mg | Freq: Three times a day (TID) | INTRAMUSCULAR | Status: DC | PRN

## 2018-10-13 MED ORDER — METOCLOPRAMIDE HCL 5 MG PO TABS: 5.0000 mg | ORAL_TABLET | Freq: Three times a day (TID) | ORAL | Status: DC | PRN

## 2018-10-13 MED ORDER — SUGAMMADEX SODIUM 500 MG/5ML IV SOLN
INTRAVENOUS | Status: DC | PRN
  Administered 2018-10-13: 158.8 mg via INTRAVENOUS

## 2018-10-13 MED ORDER — INSULIN GLARGINE 100 UNIT/ML ~~LOC~~ SOLN
15.0000 [IU] | Freq: Every day | SUBCUTANEOUS | Status: DC
  Administered 2018-10-13 – 2018-10-17 (×5): 15 [IU] via SUBCUTANEOUS
  Filled 2018-10-13 (×6): qty 0.15

## 2018-10-13 MED ORDER — PROPOFOL 10 MG/ML IV BOLUS
INTRAVENOUS | Status: DC | PRN
  Administered 2018-10-13: 120 mg via INTRAVENOUS

## 2018-10-13 MED ORDER — METOPROLOL TARTRATE 25 MG PO TABS
25.0000 mg | ORAL_TABLET | Freq: Three times a day (TID) | ORAL | Status: DC
  Administered 2018-10-13 – 2018-10-18 (×9): 25 mg via ORAL
  Filled 2018-10-13 (×10): qty 1

## 2018-10-13 MED ORDER — FENTANYL CITRATE (PF) 100 MCG/2ML IJ SOLN: 25.0000 ug | INTRAMUSCULAR | Status: DC | PRN

## 2018-10-13 MED ORDER — METHOCARBAMOL 1000 MG/10ML IJ SOLN
500.0000 mg | Freq: Four times a day (QID) | INTRAVENOUS | Status: DC | PRN
  Filled 2018-10-13: qty 5

## 2018-10-13 MED ORDER — ZOLPIDEM TARTRATE 5 MG PO TABS: 5.0000 mg | ORAL_TABLET | Freq: Every evening | ORAL | Status: DC | PRN

## 2018-10-13 MED ORDER — METHOCARBAMOL 500 MG PO TABS
500.0000 mg | ORAL_TABLET | Freq: Four times a day (QID) | ORAL | Status: DC | PRN
  Filled 2018-10-13: qty 1

## 2018-10-13 MED ORDER — FERROUS SULFATE 325 (65 FE) MG PO TABS
325.0000 mg | ORAL_TABLET | Freq: Every day | ORAL | Status: DC
  Administered 2018-10-14 – 2018-10-18 (×4): 325 mg via ORAL
  Filled 2018-10-13 (×4): qty 1

## 2018-10-13 MED ORDER — CLINDAMYCIN PHOSPHATE 600 MG/50ML IV SOLN
600.0000 mg | Freq: Three times a day (TID) | INTRAVENOUS | Status: AC
  Administered 2018-10-13 – 2018-10-14 (×3): 600 mg via INTRAVENOUS
  Filled 2018-10-13 (×3): qty 50

## 2018-10-13 MED ORDER — SODIUM CHLORIDE 0.9 % IV BOLUS
500.0000 mL | Freq: Once | INTRAVENOUS | Status: AC
  Administered 2018-10-13: 10:00:00 500 mL via INTRAVENOUS

## 2018-10-13 MED ORDER — DOCUSATE SODIUM 100 MG PO CAPS
100.0000 mg | ORAL_CAPSULE | Freq: Two times a day (BID) | ORAL | Status: DC
  Administered 2018-10-13 – 2018-10-18 (×10): 100 mg via ORAL
  Filled 2018-10-13 (×10): qty 1

## 2018-10-13 MED ORDER — INSULIN ASPART 100 UNIT/ML ~~LOC~~ SOLN
3.0000 [IU] | Freq: Three times a day (TID) | SUBCUTANEOUS | Status: DC
  Administered 2018-10-14 – 2018-10-18 (×13): 3 [IU] via SUBCUTANEOUS
  Filled 2018-10-13 (×13): qty 1

## 2018-10-13 MED ORDER — ONDANSETRON HCL 4 MG/2ML IJ SOLN
4.0000 mg | Freq: Four times a day (QID) | INTRAMUSCULAR | Status: DC | PRN
  Administered 2018-10-13: 4 mg via INTRAVENOUS

## 2018-10-13 MED ORDER — ONDANSETRON HCL 4 MG/2ML IJ SOLN
4.0000 mg | Freq: Once | INTRAMUSCULAR | Status: AC | PRN
  Administered 2018-10-13: 4 mg via INTRAVENOUS

## 2018-10-13 MED ORDER — FENTANYL CITRATE (PF) 100 MCG/2ML IJ SOLN
INTRAMUSCULAR | Status: AC
  Filled 2018-10-13: qty 2

## 2018-10-13 MED ORDER — DILTIAZEM HCL ER COATED BEADS 120 MG PO CP24
120.0000 mg | ORAL_CAPSULE | Freq: Every day | ORAL | Status: DC
  Administered 2018-10-14 – 2018-10-18 (×4): 120 mg via ORAL
  Filled 2018-10-13 (×5): qty 1

## 2018-10-13 MED ORDER — SODIUM CHLORIDE 0.45 % IV SOLN
INTRAVENOUS | Status: DC
  Administered 2018-10-13: 21:00:00 via INTRAVENOUS

## 2018-10-13 MED ORDER — ONDANSETRON HCL 4 MG/2ML IJ SOLN
4.0000 mg | Freq: Three times a day (TID) | INTRAMUSCULAR | Status: DC | PRN
  Filled 2018-10-13: qty 2

## 2018-10-13 MED ORDER — PROPOFOL 500 MG/50ML IV EMUL
INTRAVENOUS | Status: AC
  Filled 2018-10-13: qty 50

## 2018-10-13 MED ORDER — DOCUSATE SODIUM 100 MG PO CAPS: 100.0000 mg | ORAL_CAPSULE | Freq: Two times a day (BID) | ORAL | Status: DC

## 2018-10-13 MED ORDER — ONDANSETRON HCL 4 MG/2ML IJ SOLN
INTRAMUSCULAR | Status: AC
  Administered 2018-10-13: 4 mg via INTRAVENOUS
  Filled 2018-10-13: qty 2

## 2018-10-13 MED ORDER — MORPHINE SULFATE (PF) 2 MG/ML IV SOLN
1.0000 mg | INTRAVENOUS | Status: DC | PRN
  Administered 2018-10-14: 10:00:00 1 mg via INTRAVENOUS
  Filled 2018-10-13: qty 1

## 2018-10-13 MED ORDER — VASOPRESSIN 20 UNIT/ML IV SOLN
INTRAVENOUS | Status: DC | PRN
  Administered 2018-10-13: 1 [IU] via INTRAVENOUS

## 2018-10-13 MED ORDER — NEOMYCIN-POLYMYXIN B GU 40-200000 IR SOLN
Status: AC
  Filled 2018-10-13: qty 20

## 2018-10-13 MED ORDER — SEVOFLURANE IN SOLN
RESPIRATORY_TRACT | Status: AC
  Filled 2018-10-13: qty 250

## 2018-10-13 MED ORDER — MUPIROCIN 2 % EX OINT
1.0000 "application " | TOPICAL_OINTMENT | Freq: Two times a day (BID) | CUTANEOUS | Status: AC
  Administered 2018-10-13 – 2018-10-17 (×8): 1 via NASAL
  Filled 2018-10-13: qty 22

## 2018-10-13 MED ORDER — SODIUM CHLORIDE FLUSH 0.9 % IV SOLN
INTRAVENOUS | Status: AC
  Filled 2018-10-13: qty 40

## 2018-10-13 MED ORDER — HYDROCODONE-ACETAMINOPHEN 5-325 MG PO TABS
1.0000 | ORAL_TABLET | Freq: Four times a day (QID) | ORAL | Status: DC | PRN
  Administered 2018-10-13: 1 via ORAL
  Filled 2018-10-13 (×3): qty 1

## 2018-10-13 MED ORDER — BUPIVACAINE LIPOSOME 1.3 % IJ SUSP
INTRAMUSCULAR | Status: AC
  Filled 2018-10-13: qty 20

## 2018-10-13 MED ORDER — ONDANSETRON HCL 4 MG PO TABS: 4.0000 mg | ORAL_TABLET | Freq: Four times a day (QID) | ORAL | Status: DC | PRN

## 2018-10-13 MED ORDER — GABAPENTIN 300 MG PO CAPS
300.0000 mg | ORAL_CAPSULE | Freq: Three times a day (TID) | ORAL | Status: DC
  Administered 2018-10-13 – 2018-10-15 (×5): 300 mg via ORAL
  Filled 2018-10-13 (×5): qty 1

## 2018-10-13 MED ORDER — POLYETHYLENE GLYCOL 3350 17 G PO PACK: 17.0000 g | PACK | Freq: Every day | ORAL | Status: DC | PRN

## 2018-10-13 MED ORDER — NITROGLYCERIN 0.4 MG SL SUBL: 0.4000 mg | SUBLINGUAL_TABLET | SUBLINGUAL | Status: DC | PRN

## 2018-10-13 MED ORDER — ALUM & MAG HYDROXIDE-SIMETH 200-200-20 MG/5ML PO SUSP: 30.0000 mL | ORAL | Status: DC | PRN

## 2018-10-13 MED ORDER — GLYCOPYRROLATE 0.2 MG/ML IJ SOLN
INTRAMUSCULAR | Status: DC | PRN
  Administered 2018-10-13: 0.2 mg via INTRAVENOUS

## 2018-10-13 MED ORDER — SODIUM CHLORIDE 0.9 % IV SOLN
INTRAVENOUS | Status: DC
  Administered 2018-10-13 (×2): via INTRAVENOUS

## 2018-10-13 MED ORDER — CEFAZOLIN SODIUM-DEXTROSE 2-4 GM/100ML-% IV SOLN
2.0000 g | Freq: Three times a day (TID) | INTRAVENOUS | Status: AC
  Administered 2018-10-13 – 2018-10-14 (×3): 2 g via INTRAVENOUS
  Filled 2018-10-13 (×3): qty 100

## 2018-10-13 MED ORDER — KETAMINE HCL 50 MG/ML IJ SOLN
INTRAMUSCULAR | Status: AC
  Filled 2018-10-13: qty 10

## 2018-10-13 MED ORDER — ONDANSETRON HCL 4 MG/2ML IJ SOLN
4.0000 mg | Freq: Once | INTRAMUSCULAR | Status: AC
  Administered 2018-10-13: 4 mg via INTRAVENOUS
  Filled 2018-10-13: qty 2

## 2018-10-13 MED ORDER — BUPIVACAINE-EPINEPHRINE (PF) 0.25% -1:200000 IJ SOLN
INTRAMUSCULAR | Status: AC
  Filled 2018-10-13: qty 30

## 2018-10-13 MED ORDER — LIDOCAINE HCL (CARDIAC) PF 100 MG/5ML IV SOSY
PREFILLED_SYRINGE | INTRAVENOUS | Status: DC | PRN
  Administered 2018-10-13: 40 mg via INTRAVENOUS

## 2018-10-13 MED ORDER — EMPAGLIFLOZIN 25 MG PO TABS: 25.0000 mg | ORAL_TABLET | Freq: Every day | ORAL | Status: DC

## 2018-10-13 MED ORDER — SUCCINYLCHOLINE CHLORIDE 20 MG/ML IJ SOLN
INTRAMUSCULAR | Status: DC | PRN
  Administered 2018-10-13: 100 mg via INTRAVENOUS

## 2018-10-13 MED ORDER — FLEET ENEMA 7-19 GM/118ML RE ENEM: 1.0000 | ENEMA | Freq: Once | RECTAL | Status: DC | PRN

## 2018-10-13 MED ORDER — FENTANYL CITRATE (PF) 100 MCG/2ML IJ SOLN
100.0000 ug | Freq: Once | INTRAMUSCULAR | Status: AC
  Administered 2018-10-13: 100 ug via INTRAVENOUS
  Filled 2018-10-13: qty 2

## 2018-10-13 MED ORDER — BISACODYL 10 MG RE SUPP: 10.0000 mg | Freq: Every day | RECTAL | Status: DC | PRN

## 2018-10-13 MED ORDER — AMIODARONE HCL 200 MG PO TABS
200.0000 mg | ORAL_TABLET | Freq: Every day | ORAL | Status: DC
  Administered 2018-10-14 – 2018-10-18 (×4): 200 mg via ORAL
  Filled 2018-10-13 (×4): qty 1

## 2018-10-13 MED ORDER — ACETAMINOPHEN 325 MG PO TABS
325.0000 mg | ORAL_TABLET | Freq: Four times a day (QID) | ORAL | Status: DC | PRN
  Administered 2018-10-17: 325 mg via ORAL
  Filled 2018-10-13: qty 1

## 2018-10-13 MED ORDER — INSULIN ASPART 100 UNIT/ML ~~LOC~~ SOLN
0.0000 [IU] | Freq: Three times a day (TID) | SUBCUTANEOUS | Status: DC
  Administered 2018-10-13: 13:00:00 5 [IU] via SUBCUTANEOUS
  Administered 2018-10-14: 12:00:00 2 [IU] via SUBCUTANEOUS
  Administered 2018-10-14: 1 [IU] via SUBCUTANEOUS
  Administered 2018-10-15: 3 [IU] via SUBCUTANEOUS
  Administered 2018-10-15 (×2): 2 [IU] via SUBCUTANEOUS
  Administered 2018-10-16: 1 [IU] via SUBCUTANEOUS
  Administered 2018-10-16 (×2): 2 [IU] via SUBCUTANEOUS
  Administered 2018-10-17: 09:00:00 1 [IU] via SUBCUTANEOUS
  Administered 2018-10-17: 2 [IU] via SUBCUTANEOUS
  Administered 2018-10-17: 17:00:00 5 [IU] via SUBCUTANEOUS
  Administered 2018-10-18: 3 [IU] via SUBCUTANEOUS
  Filled 2018-10-13 (×13): qty 1

## 2018-10-13 MED ORDER — HYDROCODONE-ACETAMINOPHEN 7.5-325 MG PO TABS: 1.0000 | ORAL_TABLET | Freq: Four times a day (QID) | ORAL | Status: DC | PRN

## 2018-10-13 MED ORDER — MENTHOL 3 MG MT LOZG
1.0000 | LOZENGE | OROMUCOSAL | Status: DC | PRN
  Filled 2018-10-13: qty 9

## 2018-10-13 MED ORDER — CEFAZOLIN SODIUM-DEXTROSE 1-4 GM/50ML-% IV SOLN
INTRAVENOUS | Status: AC
  Filled 2018-10-13: qty 50

## 2018-10-13 MED ORDER — PHENOL 1.4 % MT LIQD
1.0000 | OROMUCOSAL | Status: DC | PRN
  Filled 2018-10-13: qty 177

## 2018-10-13 MED ORDER — EPHEDRINE SULFATE 50 MG/ML IJ SOLN
INTRAMUSCULAR | Status: DC | PRN
  Administered 2018-10-13 (×2): 10 mg via INTRAVENOUS

## 2018-10-13 MED ORDER — HYDROCODONE-ACETAMINOPHEN 5-325 MG PO TABS
1.0000 | ORAL_TABLET | Freq: Four times a day (QID) | ORAL | Status: DC | PRN
  Administered 2018-10-13 – 2018-10-18 (×9): 1 via ORAL
  Filled 2018-10-13 (×7): qty 1

## 2018-10-13 SURGICAL SUPPLY — 54 items
BLADE DEBAKEY 8.0 (BLADE) ×1 IMPLANT
BLADE SAGITTAL WIDE XTHICK NO (BLADE) ×2 IMPLANT
BLADE SURG SZ10 CARB STEEL (BLADE) ×2 IMPLANT
CANISTER SUCT 1200ML W/VALVE (MISCELLANEOUS) ×6 IMPLANT
CHLORAPREP W/TINT 26 (MISCELLANEOUS) ×4 IMPLANT
COVER WAND RF STERILE (DRAPES) ×2 IMPLANT
DRAPE INCISE IOBAN 66X60 STRL (DRAPES) ×4 IMPLANT
DRAPE TABLE BACK 80X90 (DRAPES) ×2 IMPLANT
DRSG AQUACEL AG ADV 3.5X10 (GAUZE/BANDAGES/DRESSINGS) ×1 IMPLANT
DRSG AQUACEL AG ADV 3.5X14 (GAUZE/BANDAGES/DRESSINGS) ×2 IMPLANT
ELECT BLADE 6.5 EXT (BLADE) ×2 IMPLANT
ELECT CAUTERY BLADE 6.4 (BLADE) ×2 IMPLANT
ELECT REM PT RETURN 9FT ADLT (ELECTROSURGICAL) ×2
ELECTRODE REM PT RTRN 9FT ADLT (ELECTROSURGICAL) ×1 IMPLANT
GAUZE SPONGE 4X4 12PLY STRL (GAUZE/BANDAGES/DRESSINGS) ×2 IMPLANT
GAUZE XEROFORM 1X8 LF (GAUZE/BANDAGES/DRESSINGS) ×4 IMPLANT
GLOVE INDICATOR 8.0 STRL GRN (GLOVE) ×2 IMPLANT
GLOVE SURG ORTHO 8.5 STRL (GLOVE) ×2 IMPLANT
GOWN STRL REUS W/ TWL LRG LVL3 (GOWN DISPOSABLE) ×2 IMPLANT
GOWN STRL REUS W/TWL LRG LVL3 (GOWN DISPOSABLE) ×2
GOWN STRL REUS W/TWL LRG LVL4 (GOWN DISPOSABLE) ×2 IMPLANT
HEAD MODULAR ENDO (Orthopedic Implant) ×1 IMPLANT
HEAD UNPLR 54XMDLR STRL HIP (Orthopedic Implant) IMPLANT
HEMOVAC 400CC 10FR (MISCELLANEOUS) ×2 IMPLANT
IV NS 1000ML (IV SOLUTION) ×1
IV NS 1000ML BAXH (IV SOLUTION) ×1 IMPLANT
KIT TURNOVER KIT A (KITS) ×2 IMPLANT
NDL FILTER BLUNT 18X1 1/2 (NEEDLE) ×1 IMPLANT
NDL MAYO CATGUT SZ4 TPR NDL (NEEDLE) ×1 IMPLANT
NDL SPNL 18GX3.5 QUINCKE PK (NEEDLE) ×2 IMPLANT
NEEDLE FILTER BLUNT 18X 1/2SAF (NEEDLE) ×1
NEEDLE FILTER BLUNT 18X1 1/2 (NEEDLE) ×1 IMPLANT
NEEDLE MAYO CATGUT SZ4 (NEEDLE) ×2 IMPLANT
NEEDLE SPNL 18GX3.5 QUINCKE PK (NEEDLE) ×2 IMPLANT
NS IRRIG 1000ML POUR BTL (IV SOLUTION) ×2 IMPLANT
PACK HIP PROSTHESIS (MISCELLANEOUS) ×2 IMPLANT
PAD ABD DERMACEA PRESS 5X9 (GAUZE/BANDAGES/DRESSINGS) ×4 IMPLANT
PULSAVAC PLUS IRRIG FAN TIP (DISPOSABLE) ×2
SLEEVE UNITRAX V40 STD (Orthopedic Implant) ×1 IMPLANT
SOL PREP PVP 2OZ (MISCELLANEOUS) ×2
SOLUTION PREP PVP 2OZ (MISCELLANEOUS) ×1 IMPLANT
STAPLER SKIN PROX 35W (STAPLE) ×2 IMPLANT
STEM FEM ACCOLADE 38X102X30 S3 (Stem) ×1 IMPLANT
SUT DVC 2 QUILL PDO  T11 36X36 (SUTURE) ×2
SUT DVC 2 QUILL PDO T11 36X36 (SUTURE) ×2 IMPLANT
SUT QUILL PDO 0 36 36 VIOLET (SUTURE) ×2 IMPLANT
SUT TICRON 2-0 30IN 311381 (SUTURE) ×8 IMPLANT
SYR 10ML LL (SYRINGE) ×2 IMPLANT
SYR 30ML LL (SYRINGE) ×2 IMPLANT
SYR 50ML LL SCALE MARK (SYRINGE) ×2 IMPLANT
TAPE MICROFOAM 4IN (TAPE) ×2 IMPLANT
TIP FAN IRRIG PULSAVAC PLUS (DISPOSABLE) ×1 IMPLANT
TUBE SUCT KAM VAC (TUBING) ×2 IMPLANT
WATER STERILE IRR 1000ML POUR (IV SOLUTION) ×1 IMPLANT

## 2018-10-13 NOTE — Op Note (Signed)
10/13/2018  6:11 PM  PATIENT:  [name]   MRN: [id number]  PRE-OPERATIVE DIAGNOSIS:  Displaced Subcapital fracture right hip   POST-OPERATIVE DIAGNOSIS: Same  PROCEDURE: Right   hip hemiarthroplasty with Stryker Accolade prosthesis  PREOPERATIVE INDICATIONS:  [name] is an 84 y.o. male who was admitted 10/13/2018 with a diagnosis of displaced subcapital fracture of the hip and elected for surgical management.  The risks benefits and alternatives were discussed with the patient including but not limited to the risks of nonoperative treatment, versus surgical intervention including infection, bleeding, nerve injury, periprosthetic fracture, the need for revision surgery, dislocation, leg length discrepancy, blood clots, cardiopulmonary complications, morbidity, mortality, among others, and they were willing to proceed.  Predicted outcome is good, although there will be at least a six to nine month expected recovery.     SURGEON:  Earnestine Leys, MD  ASST:    ANESTHESIA: General endotracheal    COMPLICATIONS:  None.   EBL: 50 cc    COMPONENTS:  Stryker Accolade Femoral Fracture stem size # 3  ,   and a size   54 mm  fracture head unipolar hip ball with    standard   neck length.    PROCEDURE IN DETAIL: The patient was met in the holding area and identified.  The appropriate hip  was marked at the operative site. The patient was then transported to the OR and  placed under general anesthesia.  At that point, the patient was  placed in the lateral decubitus position with the operative side up and  secured to the operating room table and all bony prominences padded.     The operative lower extremity was prepped from the iliac crest to the toes.  Sterile draping was performed.  Time out was performed prior to incision.      A routine posterolateral approach was utilized via sharp dissection  carried down to the subcutaneous tissue.  Gross bleeders were Bovie  coagulated.  The  iliotibial band was identified and incised  along the length of the skin incision.  Self-retaining retractors were  inserted.  With the hip internally rotated, the short external rotators  were identified. The piriformis was tagged and the hip capsule released in a T-type fashion.  The femoral neck was exposed, and I resected the femoral neck using the appropriate jig. This was performed at approximately a thumb's breadth above the lesser trochanter.    I then exposed the deep acetabulum, cleared out any tissue including the ligamentum teres.    I then prepared the proximal femur using the cookie-cutter, the lateralizing reamer, and then sequentially broached.  A trial stem   was  utilized along with a unipolar head and neck.  I reduced the hip and it was found to have excellent stability with functional range of motion. Leg lengths at the knees were equal.  As of the left BK amputation further analysis could not be done.  The hip was stable and did not piston.  The trial components were then removed.   The same size Accolade femoral stem was then inserted and was very stable.  The Unitrax head and neck as trialed were inserted as well.     The hip was then reduced and taken through functional range of motion and found to have excellent stability. Leg lengths were restored.     I closed the T in the capsule with #2 Ticron as well as the short external rotators. A hemovac was  inserted.    I then irrigated the hip copiously again with pulse lavage, and repaired the fascia with #2 Quill and the subcutaneous layer with #0 Quill. Sponge and needle counts were correct. Dry sterile Aquacell was applied.   The patient was then awakened and returned to PACU in stable and satisfactory condition. There were no complications.  Jesse Breed, MD Orthopedic Surgeon [phone]   10/13/2018 6:11 PM

## 2018-10-13 NOTE — ED Notes (Signed)
ED TO INPATIENT HANDOFF REPORT  ED Nurse Name and Phone #: Luetta Nutting [phone]  S Name/Age/Gender [name] 84 y.o. male Room/Bed: ED04A/ED04A  Code Status   Code Status: Prior  Home/SNF/Other Home Patient oriented to: self, place, time and situation Is this baseline? Yes   Triage Complete: Triage complete  Chief Complaint fall  Triage Note Pt to ED via [facility] for fall yesterday in a parking lot. Pt reports that his left artifical leg gave out and he landed on the right side. Pt reports sudden pain the right side. Pt unable to straighten right leg out, on arrival to ED pt c/o pain in his back. Pt having pain with manipulation of right leg. Pt denies hitting his head.    Allergies Allergies  Allergen Reactions  . Contrast Media [Iodinated Diagnostic Agents] Shortness Of Breath    SOB  . Iohexol Shortness Of Breath     Desc: Respiratory Distress, Laryngedema, diaphoresis, Onset Date: [id number]   . Metrizamide Shortness Of Breath    SOB SOB SOB   . Latex Rash    Level of Care/Admitting Diagnosis ED Disposition    ED Disposition Condition [facility] Area: [place] [100120]  Level of Care: Med-Surg [16]  Covid Evaluation: N/A  Diagnosis: Femur fracture [facility]) [[id number]]  Admitting Physician: Odessa Fleming  Attending Physician: Odessa Fleming  Estimated length of stay: past midnight tomorrow  Certification:: I certify this patient will need inpatient services for at least 2 midnights  PT Class (Do Not Modify): Inpatient [101]  PT Acc Code (Do Not Modify): Private [1]       B Medical/Surgery History Past Medical History:  Diagnosis Date  . Arthritis   . Atrial fibrillation ([place])   . Carcinoma of prostate ([place])   . CHF (congestive heart failure) ([place])   . Chronic kidney disease   . Diabetes mellitus without complication ([place])   . ED (erectile dysfunction)   . Frequent urination   . Hyperlipidemia   .  Hypertension   . Moderate mitral insufficiency   . Peripheral vascular disease ([place])   . Pneumonia 04/2016  . Prostate cancer ([place])   . Sleep apnea    OSA--USE C-PAP  . Ulcer of left foot due to type 2 diabetes mellitus ([place])   . Urinary stress incontinence, male    Past Surgical History:  Procedure Laterality Date  . AMPUTATION Left 01/12/2017   Procedure: AMPUTATION BELOW KNEE;  Surgeon: Algernon Huxley, MD;  Location: [facility] ORS;  Service: General;  Laterality: Left;  . APLIGRAFT PLACEMENT Left 10/15/2016   Procedure: APLIGRAFT PLACEMENT;  Surgeon: Albertine Patricia, DPM;  Location: [facility] ORS;  Service: Podiatry;  Laterality: Left;  necrotic ulcer  . CHOLECYSTECTOMY    . EYE SURGERY Bilateral    Cataract Extraction with IOL  . IRRIGATION AND DEBRIDEMENT FOOT Left 10/15/2016   Procedure: IRRIGATION AND DEBRIDEMENT FOOT-EXCISIONAL DEBRIDEMENT OF SKIN 3RD, 4TH AND 5TH TOES WITH APPLICATION OF APLIGRAFT;  Surgeon: Albertine Patricia, DPM;  Location: [facility] ORS;  Service: Podiatry;  Laterality: Left;  necrotic,gangrene  . IRRIGATION AND DEBRIDEMENT FOOT Left 12/09/2016   Procedure: IRRIGATION AND DEBRIDEMENT FOOT-MUSCLE/FASCIA, RESECTION OF FOURTH AND FIFTH METATARSAL NECROTIC BONE;  Surgeon: Albertine Patricia, DPM;  Location: [facility] ORS;  Service: Podiatry;  Laterality: Left;  . LEFT HEART CATH AND CORONARY ANGIOGRAPHY N/A 06/05/2018   Procedure: LEFT HEART CATH AND CORONARY ANGIOGRAPHY;  Surgeon: Yolonda Kida, MD;  Location: [facility]  [facility];  Service: Cardiovascular;  Laterality: N/A;  . LOWER EXTREMITY ANGIOGRAPHY Left 08/16/2016   Procedure: Lower Extremity Angiography;  Surgeon: Algernon Huxley, MD;  Location: [facility] CV LAB;  Service: Cardiovascular;  Laterality: Left;  . LOWER EXTREMITY ANGIOGRAPHY Left 09/01/2016   Procedure: Lower Extremity Angiography;  Surgeon: Algernon Huxley, MD;  Location: [facility] CV LAB;  Service: Cardiovascular;  Laterality: Left;  . LOWER EXTREMITY ANGIOGRAPHY  Left 10/14/2016   Procedure: Lower Extremity Angiography;  Surgeon: Algernon Huxley, MD;  Location: [facility] CV LAB;  Service: Cardiovascular;  Laterality: Left;  . LOWER EXTREMITY ANGIOGRAPHY Left 12/20/2016   Procedure: Lower Extremity Angiography;  Surgeon: Algernon Huxley, MD;  Location: [facility] CV LAB;  Service: Cardiovascular;  Laterality: Left;  . LOWER EXTREMITY INTERVENTION  09/01/2016   Procedure: Lower Extremity Intervention;  Surgeon: Algernon Huxley, MD;  Location: [facility] CV LAB;  Service: Cardiovascular;;  . PROSTATE SURGERY     removal  . TONSILLECTOMY     as a child     A IV Location/Drains/Wounds Patient Lines/Drains/Airways Status   Active Line/Drains/Airways    Name:   Placement date:   Placement time:   Site:   Days:   Peripheral IV 10/13/18 Left Antecubital   10/13/18    0808    Antecubital   less than 1          Intake/Output Last 24 hours  Intake/Output Summary (Last 24 hours) at 10/13/2018 1100 Last data filed at 10/13/2018 1007 Gross per 24 hour  Intake 500 ml  Output -  Net 500 ml    Labs/Imaging Results for orders placed or performed during the hospital encounter of 10/13/18 (from the past 48 hour(s))  CBC     Status: Abnormal   Collection Time: 10/13/18  8:36 AM  Result Value Ref Range   WBC 13.6 (H) 4.0 - 10.5 K/uL   RBC 5.04 4.22 - 5.81 MIL/uL   Hemoglobin 15.0 13.0 - 17.0 g/dL   HCT 43.6 39.0 - 52.0 %   MCV 86.5 80.0 - 100.0 fL   MCH 29.8 26.0 - 34.0 pg   MCHC 34.4 30.0 - 36.0 g/dL   RDW 14.6 11.5 - 15.5 %   Platelets 223 150 - 400 K/uL   nRBC 0.0 0.0 - 0.2 %    Comment: Performed at [facility], [address] [place], [place] [address]  Basic metabolic panel     Status: Abnormal   Collection Time: 10/13/18  8:36 AM  Result Value Ref Range   Sodium 137 135 - 145 mmol/L   Potassium 4.2 3.5 - 5.1 mmol/L   Chloride 102 98 - 111 mmol/L   CO2 24 22 - 32 mmol/L   Glucose, Bld 242 (H) 70 - 99 mg/dL   BUN 38 (H) 8 - 23  mg/dL   Creatinine, Ser 2.03 (H) 0.61 - 1.24 mg/dL   Calcium 8.9 8.9 - 10.3 mg/dL   GFR calc non Af Amer 30 (L) >60 mL/min   GFR calc Af Amer 35 (L) >60 mL/min   Anion gap 11 5 - 15    Comment: Performed at [facility], [address] [place],  [address]  Protime-INR     Status: None   Collection Time: 10/13/18  8:36 AM  Result Value Ref Range   Prothrombin Time 14.9 11.4 - 15.2 seconds   INR 1.2 0.8 - 1.2    Comment: (NOTE) INR goal varies based  on device and disease states. Performed at [facility], [address] [place], [place] [address]   APTT     Status: None   Collection Time: 10/13/18  8:36 AM  Result Value Ref Range   aPTT 26 24 - 36 seconds    Comment: Performed at [facility], [address] [place], [place] [address]  Type and screen     Status: None   Collection Time: 10/13/18  8:36 AM  Result Value Ref Range   ABO/RH(D) O POS    Antibody Screen NEG    Sample Expiration      10/16/2018,2359 Performed at [facility], [address], [place], [place] [address]   SARS Coronavirus 2 (CEPHEID - Performed in [facility] hospital lab), Hosp Order     Status: None   Collection Time: 10/13/18  9:32 AM  Result Value Ref Range   SARS Coronavirus 2 NEGATIVE NEGATIVE    Comment: (NOTE) If result is NEGATIVE SARS-CoV-2 target nucleic acids are NOT DETECTED. The SARS-CoV-2 RNA is generally detectable in upper and lower  respiratory specimens during the acute phase of infection. The lowest  concentration of SARS-CoV-2 viral copies this assay can detect is 250  copies / mL. A negative result does not preclude SARS-CoV-2 infection  and should not be used as the sole basis for treatment or other  patient management decisions.  A negative result may occur with  improper specimen collection / handling, submission of specimen other  than nasopharyngeal swab, presence of viral mutation(s) within the  areas targeted by this  assay, and inadequate number of viral copies  (<250 copies / mL). A negative result must be combined with clinical  observations, patient history, and epidemiological information. If result is POSITIVE SARS-CoV-2 target nucleic acids are DETECTED. The SARS-CoV-2 RNA is generally detectable in upper and lower  respiratory specimens dur ing the acute phase of infection.  Positive  results are indicative of active infection with SARS-CoV-2.  Clinical  correlation with patient history and other diagnostic information is  necessary to determine patient infection status.  Positive results do  not rule out bacterial infection or co-infection with other viruses. If result is PRESUMPTIVE POSTIVE SARS-CoV-2 nucleic acids MAY BE PRESENT.   A presumptive positive result was obtained on the submitted specimen  and confirmed on repeat testing.  While 2019 novel coronavirus  (SARS-CoV-2) nucleic acids may be present in the submitted sample  additional confirmatory testing may be necessary for epidemiological  and / or clinical management purposes  to differentiate between  SARS-CoV-2 and other Sarbecovirus currently known to infect humans.  If clinically indicated additional testing with an alternate test  methodology [phone]) is advised. The SARS-CoV-2 RNA is generally  detectable in upper and lower respiratory sp ecimens during the acute  phase of infection. The expected result is Negative. Fact Sheet for Patients:  StrictlyIdeas.no Fact Sheet for Healthcare Providers: BankingDealers.co.za This test is not yet approved or cleared by the [place] FDA and has been authorized for detection and/or diagnosis of SARS-CoV-2 by FDA under an Emergency Use Authorization (EUA).  This EUA will remain in effect (meaning this test can be used) for the duration of the COVID-19 declaration under Section 564(b)(1) of the Act, 21 U.S.C. section 360bbb-3(b)(1),  unless the authorization is terminated or revoked sooner. Performed at [facility], [address] [place], [place] [address]    Dg Chest 1 View  Result Date: 10/13/2018 CLINICAL DATA:  Fall  yesterday. EXAM: CHEST  1 VIEW COMPARISON:  Radiograph of June 02, 2018. FINDINGS: Stable cardiomegaly. No pneumothorax or pleural effusion is noted. No acute pulmonary disease is noted. Bony thorax is unremarkable. IMPRESSION: No active disease. Electronically Signed   By: Marijo Conception M.D.   On: 10/13/2018 08:40   Dg Hip Unilat W Or Wo Pelvis 2-3 Views Right  Result Date: 10/13/2018 CLINICAL DATA:  Right leg pain after fall. EXAM: DG HIP (WITH OR WITHOUT PELVIS) 2-3V RIGHT COMPARISON:  None. FINDINGS: Mildly displaced proximal right femoral neck fracture is noted. No dislocation is noted. No significant degenerative changes noted. Vascular calcifications are noted. IMPRESSION: Mildly displaced proximal right femoral neck fracture. Electronically Signed   By: Marijo Conception M.D.   On: 10/13/2018 08:39    Pending Labs Unresulted Labs (From admission, onward)    Start     Ordered   10/13/18 1036  Urinalysis, Routine w reflex microscopic  ONCE - STAT,   STAT     10/13/18 1036   Signed and Held  CBC  Tomorrow morning,   R     Signed and Held   Signed and Held  Basic metabolic panel  Tomorrow morning,   R     Signed and Held          Vitals/Pain Today's Vitals   10/13/18 0833 10/13/18 0900 10/13/18 0930 10/13/18 1000  BP:  (!) 156/72 (!) 150/91 (!) 144/68  Pulse:  62 (!) 57 (!) 58  Resp:  17 19 15   Temp:      TempSrc:      SpO2:  92% 96% 91%  Weight:      PainSc: 8        Isolation Precautions No active isolations  Medications Medications  insulin aspart (novoLOG) injection 0-9 Units (has no administration in time range)  insulin aspart (novoLOG) injection 0-5 Units (has no administration in time range)  ceFAZolin (ANCEF) IVPB 1 g/50 mL premix (has no administration  in time range)  clindamycin (CLEOCIN) IVPB 600 mg (has no administration in time range)  fentaNYL (SUBLIMAZE) injection 100 mcg (100 mcg Intravenous Given 10/13/18 0812)  ondansetron (ZOFRAN) injection 4 mg (4 mg Intravenous Given 10/13/18 0811)  0.9 %  sodium chloride infusion ( Intravenous New Bag/Given 10/13/18 0932)  sodium chloride 0.9 % bolus 500 mL (0 mLs Intravenous Stopped 10/13/18 1007)    Mobility walks with device Moderate fall risk   Focused Assessments    R Recommendations: See Admitting Provider Note  Report given to:   Additional Notes:  Patient required O2 when receiving fentanyl in ER

## 2018-10-13 NOTE — TOC Progression Note (Signed)
Transition of Care [facility]) - Progression Note    Patient Details  Name: [name] MRN: [id number] Date of Birth: 1934-08-01  Transition of Care [facility]) CM/SW [place], RN Phone Number: 10/13/2018, 1:20 PM  Clinical Narrative:    Requested Lovenox price thru the [facility] via email to provide the patient with a price before DC   Expected Discharge Plan: (Home with [facility] vs SNF) Barriers to Discharge: No Barriers Identified  Expected Discharge Plan and Services Expected Discharge Plan: (Home with [facility] vs SNF) In-house Referral: Clinical Social Work Discharge Planning Services: CM Consult Post Acute Care Choice: NA(Patient and wife hopes to discharge home with home health) Living arrangements for the past 2 months: Single Family Home                 DME Arranged: N/A DME Agency: NA       HH Arranged: NA HH Agency: NA         Social Determinants of Health (SDOH) Interventions    Readmission Risk Interventions No flowsheet data found.

## 2018-10-13 NOTE — H&P (Signed)
[facility] [place] NAME: [name]    MR#:  [id number]  DATE OF BIRTH:  February 02, 1935  DATE OF ADMISSION:  10/13/2018  PRIMARY CARE PHYSICIAN: Ezequiel Kayser, MD   REQUESTING/REFERRING PHYSICIAN: Dr Alfred Levins  CHIEF COMPLAINT:   I fell yesterday and my right hip hurts  HISTORY OF PRESENT ILLNESS:  [name]  is a 84 y.o. male with a known history of atrial fibrillation, chronic is supposed to be on Xarelto how were not taking it since January, chronic congestive heart failure systolic, diabetes on insulin, chronic kidney disease stage III comes to the emergency room after he had a mechanical fall while he was in the parking lot his leg gave out and started hurting on his right hip. Patient has a left prosthetic leg.  In the emergency room he was found to have right proximal femoral neck fracture. ER physician has spoken with Dr. Sabra Heck from orthopedic.  Patient denies any history of chest pain or shortness of breath lately. He does not wear oxygen at home. Not have leg edema. He has good functional status. Uses his cane/walker while walking.  EKG shows sinus rhythm. No ST elevation her depression. Patient is being admitted for further evaluation management of his right hip fracture.  PAST MEDICAL HISTORY:   Past Medical History:  Diagnosis Date  . Arthritis   . Atrial fibrillation ([place])   . Carcinoma of prostate ([place])   . CHF (congestive heart failure) ([place])   . Chronic kidney disease   . Diabetes mellitus without complication ([place])   . ED (erectile dysfunction)   . Frequent urination   . Hyperlipidemia   . Hypertension   . Moderate mitral insufficiency   . Peripheral vascular disease ([place])   . Pneumonia 04/2016  . Prostate cancer ([place])   . Sleep apnea    OSA--USE C-PAP  . Ulcer of left foot due to type 2 diabetes mellitus ([place])   . Urinary stress incontinence, male     PAST SURGICAL HISTOIRY:   Past  Surgical History:  Procedure Laterality Date  . AMPUTATION Left 01/12/2017   Procedure: AMPUTATION BELOW KNEE;  Surgeon: Algernon Huxley, MD;  Location: [facility];  Service: General;  Laterality: Left;  . APLIGRAFT PLACEMENT Left 10/15/2016   Procedure: APLIGRAFT PLACEMENT;  Surgeon: Albertine Patricia, DPM;  Location: [facility];  Service: Podiatry;  Laterality: Left;  necrotic ulcer  . CHOLECYSTECTOMY    . EYE SURGERY Bilateral    Cataract Extraction with IOL  . IRRIGATION AND DEBRIDEMENT FOOT Left 10/15/2016   Procedure: IRRIGATION AND DEBRIDEMENT FOOT-EXCISIONAL DEBRIDEMENT OF SKIN 3RD, 4TH AND 5TH TOES WITH APPLICATION OF APLIGRAFT;  Surgeon: Albertine Patricia, DPM;  Location: [facility];  Service: Podiatry;  Laterality: Left;  necrotic,gangrene  . IRRIGATION AND DEBRIDEMENT FOOT Left 12/09/2016   Procedure: IRRIGATION AND DEBRIDEMENT FOOT-MUSCLE/FASCIA, RESECTION OF FOURTH AND FIFTH METATARSAL NECROTIC BONE;  Surgeon: Albertine Patricia, DPM;  Location: [facility];  Service: Podiatry;  Laterality: Left;  . LEFT HEART CATH AND CORONARY ANGIOGRAPHY N/A 06/05/2018   Procedure: LEFT HEART CATH AND CORONARY ANGIOGRAPHY;  Surgeon: Yolonda Kida, MD;  Location: [facility];  Service: Cardiovascular;  Laterality: N/A;  . LOWER EXTREMITY ANGIOGRAPHY Left 08/16/2016   Procedure: Lower Extremity Angiography;  Surgeon: Algernon Huxley, MD;  Location: [facility];  Service: Cardiovascular;  Laterality: Left;  . LOWER EXTREMITY ANGIOGRAPHY Left 09/01/2016   Procedure: Lower Extremity Angiography;  Surgeon:  Algernon Huxley, MD;  Location: [facility];  Service: Cardiovascular;  Laterality: Left;  . LOWER EXTREMITY ANGIOGRAPHY Left 10/14/2016   Procedure: Lower Extremity Angiography;  Surgeon: Algernon Huxley, MD;  Location: [facility];  Service: Cardiovascular;  Laterality: Left;  . LOWER EXTREMITY ANGIOGRAPHY Left 12/20/2016   Procedure: Lower Extremity Angiography;  Surgeon: Algernon Huxley, MD;   Location: [facility];  Service: Cardiovascular;  Laterality: Left;  . LOWER EXTREMITY INTERVENTION  09/01/2016   Procedure: Lower Extremity Intervention;  Surgeon: Algernon Huxley, MD;  Location: [facility];  Service: Cardiovascular;;  . PROSTATE SURGERY     removal  . TONSILLECTOMY     as a child    SOCIAL HISTORY:   Social History   Tobacco Use  . Smoking status: Former Smoker    Packs/day: 0.25    Types: Cigarettes    Last attempt to quit: 10/14/1994    Years since quitting: 24.0  . Smokeless tobacco: Never Used  Substance Use Topics  . Alcohol use: No    Alcohol/week: 0.0 standard drinks    FAMILY HISTORY:   Family History  Problem Relation Age of Onset  . Hypertension Mother   . Diabetes Mother   . Prostate cancer Neg Hx   . Bladder Cancer Neg Hx   . Kidney disease Neg Hx     DRUG ALLERGIES:   Allergies  Allergen Reactions  . Contrast Media [Iodinated Diagnostic Agents] Shortness Of Breath    SOB  . Iohexol Shortness Of Breath     Desc: Respiratory Distress, Laryngedema, diaphoresis, Onset Date: [id number]   . Metrizamide Shortness Of Breath    SOB SOB SOB   . Latex Rash    REVIEW OF SYSTEMS:  Review of Systems  Constitutional: Negative for chills, fever and weight loss.  HENT: Negative for ear discharge, ear pain and nosebleeds.   Eyes: Negative for blurred vision, pain and discharge.  Respiratory: Negative for sputum production, shortness of breath, wheezing and stridor.   Cardiovascular: Negative for chest pain, palpitations, orthopnea and PND.  Gastrointestinal: Negative for abdominal pain, diarrhea, nausea and vomiting.  Genitourinary: Negative for frequency and urgency.  Musculoskeletal: Positive for falls and joint pain. Negative for back pain.  Neurological: Negative for sensory change, speech change, focal weakness and weakness.  Psychiatric/Behavioral: Negative for depression and hallucinations. The patient is not  nervous/anxious.      MEDICATIONS AT HOME:   Prior to Admission medications   Medication Sig Start Date End Date Taking? Authorizing Provider  amiodarone (PACERONE) 200 MG tablet Take 200 mg by mouth daily. 09/13/18  Yes [provider]  atorvastatin (LIPITOR) 80 MG tablet Take 80 mg by mouth every evening. 07/31/18  Yes [provider]  diltiazem (CARDIZEM CD) 120 MG 24 hr capsule Take 120 mg by mouth daily. 09/28/18  Yes [provider]  empagliflozin (JARDIANCE) 25 MG TABS tablet Take 25 mg by mouth daily.   Yes [provider]  furosemide (LASIX) 20 MG tablet Take 1 tablet (20 mg total) by mouth daily. 01/18/18  Yes [name], MD  HUMALOG KWIKPEN 100 UNIT/ML KwikPen Inject 3 Units into the skin 3 (three) times daily with meals. 05/11/18  Yes [provider]  insulin glargine (LANTUS) 100 UNIT/ML injection Inject 0.1 mLs (10 Units total) into the skin at bedtime. Patient taking differently: Inject 15 Units into the skin at bedtime.  10/28/17  Yes Bettey Costa, MD  insulin  lispro (HUMALOG) 100 UNIT/ML injection Inject 0.03 mLs (3 Units total) into the skin 3 (three) times daily with meals. Patient taking differently: Inject 3 Units into the skin 3 (three) times daily with meals. Plus 1 additional unit for every 50 BG points over 200 (max 5 additional units per dose) 11/28/17  Yes Wieting, Richard, MD  metoprolol tartrate (LOPRESSOR) 25 MG tablet Take 1 tablet (25 mg total) by mouth 3 (three) times daily. 11/28/17  Yes Wieting, Richard, MD  nitroGLYCERIN (NITROSTAT) 0.4 MG SL tablet Place 1 tablet (0.4 mg total) under the tongue every 5 (five) minutes as needed for chest pain. 06/09/18  Yes Sudini, Alveta Heimlich, MD  potassium chloride SA (K-DUR,KLOR-CON) 20 MEQ tablet Take 20 mEq by mouth daily.   Yes [provider]  ranolazine (RANEXA) 500 MG 12 hr tablet Take 1 tablet (500 mg total) by mouth 2 (two) times daily. 06/09/18  Yes Sudini, Alveta Heimlich, MD   amiodarone (PACERONE) 400 MG tablet Take 1 tablet (400 mg total) by mouth 2 (two) times daily. Patient not taking: Reported on 10/13/2018 06/09/18   Hillary Bow, MD  doxycycline (VIBRAMYCIN) 100 MG capsule Take 1 capsule (100 mg total) by mouth 2 (two) times daily. Patient not taking: Reported on 10/13/2018 07/27/18   Kris Hartmann, NP      VITAL SIGNS:  Blood pressure (!) 169/73, pulse (!) 59, temperature 98 F (36.7 C), temperature source Oral, resp. rate 16, weight 79.4 kg, SpO2 98 %.  PHYSICAL EXAMINATION:  GENERAL:  84 y.o.-year-old patient lying in the bed with no acute distress.  EYES: Pupils equal, round, reactive to light and accommodation. No scleral icterus. Extraocular muscles intact.  HEENT: Head atraumatic, normocephalic. Oropharynx and nasopharynx clear.  NECK:  Supple, no jugular venous distention. No thyroid enlargement, no tenderness.  LUNGS: Normal breath sounds bilaterally, no wheezing, rales,rhonchi or crepitation. No use of accessory muscles of respiration.  CARDIOVASCULAR: S1, S2 normal. No murmurs, rubs, or gallops.  ABDOMEN: Soft, nontender, nondistended. Bowel sounds present. No organomegaly or mass.  EXTREMITIES: No pedal edema, cyanosis, or clubbing. Left prosthetic leg, right hip decreased range of motion due to fracture NEUROLOGIC: Cranial nerves II through XII are intact. Muscle strength 5/5 in all extremities. Sensation intact. Gait not checked.  PSYCHIATRIC: The patient is alert and oriented x 3.  SKIN: No obvious rash, lesion, or ulcer.   LABORATORY PANEL:   CBC Recent Labs  Lab 10/13/18 0836  WBC 13.6*  HGB 15.0  HCT 43.6  PLT 223   ------------------------------------------------------------------------------------------------------------------  Chemistries  Recent Labs  Lab 10/13/18 0836  NA 137  K 4.2  CL 102  CO2 24  GLUCOSE 242*  BUN 38*  CREATININE 2.03*  CALCIUM 8.9    ------------------------------------------------------------------------------------------------------------------  Cardiac Enzymes No results for input(s): TROPONINI in the last 168 hours. ------------------------------------------------------------------------------------------------------------------  RADIOLOGY:  Dg Chest 1 View  Result Date: 10/13/2018 CLINICAL DATA:  Fall yesterday. EXAM: CHEST  1 VIEW COMPARISON:  Radiograph of June 02, 2018. FINDINGS: Stable cardiomegaly. No pneumothorax or pleural effusion is noted. No acute pulmonary disease is noted. Bony thorax is unremarkable. IMPRESSION: No active disease. Electronically Signed   By: Marijo Conception M.D.   On: 10/13/2018 08:40   Dg Hip Unilat W Or Wo Pelvis 2-3 Views Right  Result Date: 10/13/2018 CLINICAL DATA:  Right leg pain after fall. EXAM: DG HIP (WITH OR WITHOUT PELVIS) 2-3V RIGHT COMPARISON:  None. FINDINGS: Mildly displaced proximal right femoral neck fracture is noted. No dislocation  is noted. No significant degenerative changes noted. Vascular calcifications are noted. IMPRESSION: Mildly displaced proximal right femoral neck fracture. Electronically Signed   By: Marijo Conception M.D.   On: 10/13/2018 08:39    EKG:   Sinus rhythm no acute ST elevation her depression IMPRESSION AND PLAN:    [name]  is a 84 y.o. male with a known history of atrial fibrillation, chronic is supposed to be on Xarelto how were not taking it since January, chronic congestive heart failure systolic, diabetes on insulin, chronic kidney disease stage III comes to the emergency room after he had a mechanical fall while he was in the parking lot his leg gave out and started hurting on his right hip.  1. Right hip fracture/femoral neck fracture status post mechanical fall -admit to orthopedic floor -Dr. Sabra Heck consulted--- ER physician spoke with him -patient has good functional capacity. He uses walker/cane. He has a left  prosthetic leg. -Denies any chest pain or shortness of breath. EKG no acute changes. -Given his multiple medical problems patient is at a low to intermediate risk for surgery. This was discussed with him. -Continue beta blockers perioperative  2. Chronic atrial fibrillation -continue metoprolol, diltiazem, amiodarone -patient is supposed to be on Xarelto. It was held in January after he had a intraventricular hemorrhage post TPA. Patient went for follow-up with Dr. Nehemiah Massed on [phone] and was asked to resume Xarelto however he has not been taking it. This was confirmed with patient's wife [name] also that he is not taking Xarelto -will need to resume Xarelto after surgery  3. Chronic systolic congestive heart failure -holding Lasix -patient not in heart failure -last echo in January showed EF of 45 to 50%  4. Type II diabetes on insulin -sliding scale while in house -will resume Lantus and NovoLog after surgery  5. Hypertension resume home meds  6. DVT prophylaxis SCD still surgery  Above was discussed with patient and wife [name] on the phone   All the records are reviewed and case discussed with ED provider.   CODE STATUS: full  TOTAL TIME TAKING CARE OF THIS PATIENT: 45** minutes.    [name] Mandes M.D on 10/13/2018 at 10:07 AM  Between 7am to 6pm - Pager - [phone]  After 6pm go to [email or web address] - password [email or web address]  [facility]  Office  [phone]  CC: Primary care physician; Ezequiel Kayser, MD

## 2018-10-13 NOTE — ED Provider Notes (Signed)
[facility] Emergency Department Provider Note  ____________________________________________  Time seen: Approximately 8:06 AM  I have reviewed the triage vital signs and the nursing notes.   HISTORY  Chief Complaint Fall   HPI [name] is a 84 y.o. male with history of several medical problems as listed below who presents for evaluation of fall.  Patient was walking on a parking lot yesterday at 4 PM when his prosthetic leg gave out and he fell onto his right buttock.  Patient was assisted to the car by 2 other man and taken home by his wife.  Patient has had severe right buttock/hip pain since the fall that is worse with any movement of his leg.  He denies head trauma or LOC.  He denies back pain or neck pain.  He is no longer on any blood thinners.  His pain is currently 10 out of 10.   Past Medical History:  Diagnosis Date  . Arthritis   . Atrial fibrillation ([place])   . Carcinoma of prostate ([place])   . CHF (congestive heart failure) ([place])   . Chronic kidney disease   . Diabetes mellitus without complication ([place])   . ED (erectile dysfunction)   . Frequent urination   . Hyperlipidemia   . Hypertension   . Moderate mitral insufficiency   . Peripheral vascular disease ([place])   . Pneumonia 04/2016  . Prostate cancer ([place])   . Sleep apnea    OSA--USE C-PAP  . Ulcer of left foot due to type 2 diabetes mellitus ([place])   . Urinary stress incontinence, male     Patient Active Problem List   Diagnosis Date Noted  . Chest pain 06/03/2018  . NSTEMI (non-ST elevated myocardial infarction) ([place]) 01/16/2018  . Acute CHF (congestive heart failure) ([place]) 11/23/2017  . Atherosclerosis of native arteries of the extremities with ulceration ([place]) 11/18/2017  . Atrial fibrillation with rapid ventricular response ([place]) 10/15/2017  . BKA stump complication ([place]) 53/29/9242  . 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 S/P BKA  (below knee amputation) unilateral, left ([place]) 02/01/2017  . Chronic diastolic heart failure ([place]) 01/21/2017  . Pressure injury of skin 01/18/2017  . Atherosclerosis of artery of extremity with gangrene ([place]) 01/05/2017  . Diabetic foot ulcer ([place]) 12/29/2016  . Ataxia 10/21/2016  . History of femoral angiogram 09/06/2016  . Left leg pain 09/06/2016  . Leukocytosis 09/06/2016  . Generalized weakness 09/06/2016  . Atrial fibrillation, chronic 08/30/2016  . Pneumonia 05/19/2016  . Hyperlipidemia 04/20/2016  . Back pain 04/19/2016  . Type 2 diabetes mellitus treated with insulin ([place]) 04/19/2016  . Essential hypertension 04/19/2016  . PAD (peripheral artery disease) ([place]) 04/19/2016  . Erectile dysfunction following radical prostatectomy 06/25/2015  . History of prostate cancer 12/23/2014  . Incontinence 12/23/2014    Past Surgical History:  Procedure Laterality Date  . AMPUTATION Left 01/12/2017   Procedure: AMPUTATION BELOW KNEE;  Surgeon: Algernon Huxley, MD;  Location: [facility] ORS;  Service: General;  Laterality: Left;  . APLIGRAFT PLACEMENT Left 10/15/2016   Procedure: APLIGRAFT PLACEMENT;  Surgeon: Albertine Patricia, DPM;  Location: [facility] ORS;  Service: Podiatry;  Laterality: Left;  necrotic ulcer  . CHOLECYSTECTOMY    . EYE SURGERY Bilateral    Cataract Extraction with IOL  . IRRIGATION AND DEBRIDEMENT FOOT Left 10/15/2016   Procedure: IRRIGATION AND DEBRIDEMENT FOOT-EXCISIONAL DEBRIDEMENT OF SKIN 3RD, 4TH AND 5TH TOES WITH APPLICATION OF APLIGRAFT;  Surgeon: Albertine Patricia, DPM;  Location: [facility] ORS;  Service: Podiatry;  Laterality: Left;  necrotic,gangrene  . IRRIGATION AND DEBRIDEMENT FOOT Left 12/09/2016   Procedure: IRRIGATION AND DEBRIDEMENT FOOT-MUSCLE/FASCIA, RESECTION OF FOURTH AND FIFTH METATARSAL NECROTIC BONE;  Surgeon: Albertine Patricia, DPM;  Location: [facility] ORS;  Service: Podiatry;  Laterality: Left;  . LEFT HEART CATH AND CORONARY ANGIOGRAPHY N/A 06/05/2018   Procedure:  LEFT HEART CATH AND CORONARY ANGIOGRAPHY;  Surgeon: Yolonda Kida, MD;  Location: [place] [facility];  Service: Cardiovascular;  Laterality: N/A;  . LOWER EXTREMITY ANGIOGRAPHY Left 08/16/2016   Procedure: Lower Extremity Angiography;  Surgeon: Algernon Huxley, MD;  Location: [place] [facility];  Service: Cardiovascular;  Laterality: Left;  . LOWER EXTREMITY ANGIOGRAPHY Left 09/01/2016   Procedure: Lower Extremity Angiography;  Surgeon: Algernon Huxley, MD;  Location: [place] [facility];  Service: Cardiovascular;  Laterality: Left;  . LOWER EXTREMITY ANGIOGRAPHY Left 10/14/2016   Procedure: Lower Extremity Angiography;  Surgeon: Algernon Huxley, MD;  Location: [place] [facility];  Service: Cardiovascular;  Laterality: Left;  . LOWER EXTREMITY ANGIOGRAPHY Left 12/20/2016   Procedure: Lower Extremity Angiography;  Surgeon: Algernon Huxley, MD;  Location: [place] [facility];  Service: Cardiovascular;  Laterality: Left;  . LOWER EXTREMITY INTERVENTION  09/01/2016   Procedure: Lower Extremity Intervention;  Surgeon: Algernon Huxley, MD;  Location: [place] [facility];  Service: Cardiovascular;;  . PROSTATE SURGERY     removal  . TONSILLECTOMY     as a child    Prior to Admission medications   Medication Sig Start Date End Date Taking? Authorizing Provider  amiodarone (PACERONE) 400 MG tablet Take 1 tablet (400 mg total) by mouth 2 (two) times daily. 06/09/18   Hillary Bow, MD  atorvastatin (LIPITOR) 80 MG tablet Take 80 mg by mouth at bedtime.     [provider]  doxycycline (VIBRAMYCIN) 100 MG capsule Take 1 capsule (100 mg total) by mouth 2 (two) times daily. 07/27/18   Kris Hartmann, NP  empagliflozin (JARDIANCE) 25 MG TABS tablet Take 25 mg by mouth daily.    [provider]  furosemide (LASIX) 20 MG tablet Take 1 tablet (20 mg total) by mouth daily. 01/18/18   [name], MD  insulin glargine (LANTUS) 100 UNIT/ML injection Inject 0.1 mLs (10 Units total) into the skin at  bedtime. Patient taking differently: Inject 15 Units into the skin at bedtime.  10/28/17   Bettey Costa, MD  insulin lispro (HUMALOG) 100 UNIT/ML injection Inject 0.03 mLs (3 Units total) into the skin 3 (three) times daily with meals. Patient taking differently: Inject 3 Units into the skin 3 (three) times daily with meals. Plus 1 additional unit for every 50 BG points over 200 (max 5 additional units per dose) 11/28/17   Loletha Grayer, MD  metoprolol tartrate (LOPRESSOR) 25 MG tablet Take 1 tablet (25 mg total) by mouth 3 (three) times daily. 11/28/17   Loletha Grayer, MD  nitroGLYCERIN (NITROSTAT) 0.4 MG SL tablet Place 1 tablet (0.4 mg total) under the tongue every 5 (five) minutes as needed for chest pain. 06/09/18   Hillary Bow, MD  potassium chloride SA (K-DUR,KLOR-CON) 20 MEQ tablet Take 20 mEq by mouth daily.    [provider]  ranolazine (RANEXA) 500 MG 12 hr tablet Take 1 tablet (500 mg total) by mouth 2 (two) times daily. 06/09/18   Hillary Bow, MD    Allergies Contrast media [iodinated diagnostic agents]; Iohexol; Metrizamide; and Latex  Family History  Problem Relation Age of Onset  . Hypertension Mother   . Diabetes Mother   . Prostate cancer Neg Hx   . Bladder Cancer Neg Hx   . Kidney disease Neg Hx     Social History Social History   Tobacco Use  . Smoking status: Former Smoker    Packs/day: 0.25    Types: Cigarettes    Last attempt to quit: 10/14/1994    Years since quitting: 24.0  . Smokeless tobacco: Never Used  Substance Use Topics  . Alcohol use: No    Alcohol/week: 0.0 standard drinks  . Drug use: No    Review of Systems Constitutional: Negative for fever. Eyes: Negative for visual changes. ENT: Negative for facial injury or neck injury Cardiovascular: Negative for chest injury. Respiratory: Negative for shortness of breath. Negative for chest wall injury. Gastrointestinal: Negative for abdominal pain or injury. Genitourinary: Negative  for dysuria. Musculoskeletal: Negative for back injury, + R hip pain Skin: Negative for laceration/abrasions. Neurological: Negative for head injury.   ____________________________________________   PHYSICAL EXAM:  VITAL SIGNS: Vitals:   10/13/18 0804 10/13/18 0806  BP:  (!) 169/73  Pulse:  (!) 59  Resp:  16  Temp: 98 F (36.7 C)   SpO2:  98%   Full spinal precautions maintained throughout the trauma exam. Constitutional: Alert and oriented. No acute distress. Does not appear intoxicated. HEENT Head: Normocephalic and atraumatic. Face: No facial bony tenderness. Stable midface Ears: No hemotympanum bilaterally. No Battle sign Eyes: No eye injury. PERRL. No raccoon eyes Nose: Nontender. No epistaxis. No rhinorrhea Mouth/Throat: Mucous membranes are moist. No oropharyngeal blood. No dental injury. Airway patent without stridor. Normal voice. Neck: no C-collar in place. No midline c-spine tenderness.  Cardiovascular: Normal rate, regular rhythm. Normal and symmetric distal pulses are present in all extremities. Pulmonary/Chest: Chest wall is stable and nontender to palpation/compression. Normal respiratory effort. Breath sounds are normal. No crepitus.  Abdominal: Soft, nontender, non distended. Musculoskeletal: Tenderness to palpation on the posterior right hip with significant tenderness with minimal range of motion. nontender with normal full range of motion in all extremities. No deformities. No thoracic or lumbar midline spinal tenderness. Pelvis is stable. Skin: Skin is warm, dry and intact. No abrasions or contutions. Psychiatric: Speech and behavior are appropriate. Neurological: Normal speech and language. Moves all extremities to command. No gross focal neurologic deficits are appreciated.  Glascow Coma Score: 4 - Opens eyes on own 6 - Follows simple motor commands 5 - Alert and oriented GCS: 15  ____________________________________________   LABS (all labs  ordered are listed, but only abnormal results are displayed)  Labs Reviewed  CBC - Abnormal; Notable for the following components:      Result Value   WBC 13.6 (*)    All other components within normal limits  BASIC METABOLIC PANEL - Abnormal; Notable for the following components:   Glucose, Bld 242 (*)    BUN 38 (*)    Creatinine, Ser 2.03 (*)    GFR calc non Af Amer 30 (*)    GFR calc Af Amer 35 (*)    All other components within normal limits  SARS CORONAVIRUS 2 (HOSPITAL ORDER, [facility])  PROTIME-INR  APTT  TYPE AND SCREEN   ____________________________________________  EKG  ED ECG REPORT I, Rudene Re, the attending physician, personally viewed and interpreted this ECG.  Normal sinus rhythm, rate of 63, first-degree AV block, normal QTC, normal axis, minimal ST depressions in lateral  leads with no ST elevation.  No significant changes when compared to prior. ____________________________________________  RADIOLOGY  I have personally reviewed the images performed during this visit and I agree with the Radiologist's read.   Interpretation by Radiologist:  Dg Chest 1 View  Result Date: 10/13/2018 CLINICAL DATA:  Fall yesterday. EXAM: CHEST  1 VIEW COMPARISON:  Radiograph of June 02, 2018. FINDINGS: Stable cardiomegaly. No pneumothorax or pleural effusion is noted. No acute pulmonary disease is noted. Bony thorax is unremarkable. IMPRESSION: No active disease. Electronically Signed   By: Marijo Conception M.D.   On: 10/13/2018 08:40   Dg Hip Unilat W Or Wo Pelvis 2-3 Views Right  Result Date: 10/13/2018 CLINICAL DATA:  Right leg pain after fall. EXAM: DG HIP (WITH OR WITHOUT PELVIS) 2-3V RIGHT COMPARISON:  None. FINDINGS: Mildly displaced proximal right femoral neck fracture is noted. No dislocation is noted. No significant degenerative changes noted. Vascular calcifications are noted. IMPRESSION: Mildly displaced proximal right femoral  neck fracture. Electronically Signed   By: Marijo Conception M.D.   On: 10/13/2018 08:39      ____________________________________________   PROCEDURES  Procedure(s) performed: None Procedures Critical Care performed:  None ____________________________________________   INITIAL IMPRESSION / ASSESSMENT AND PLAN / ED COURSE   84 y.o. male with history of several medical problems as listed below who presents for evaluation of mechanical fall with R hip/ buttock pain. Patient arrives in significant amount of pain with tenderness to palpation on the posterior aspect of the proximal femur and pain with minimal rotation of the right hip.  Will give fentanyl and get x-rays.    _________________________ 9:09 AM on 10/13/2018 ----------------------------------------- X-ray confirms femoral neck fracture with displacement.  Discussed with Dr. Sabra Heck from orthopedics who recommended admission to the hospitalist service for surgery this morning.  Patient will remain n.p.o.  Labs for medical clearance showing acute on chronic kidney injury.  Will give IV bolus and start patient on a drip.    As part of my medical decision making, I reviewed the following data within the [place] notes reviewed and incorporated, Labs reviewed , EKG interpreted , Old chart reviewed, Radiograph reviewed , Discussed with admitting physician , A consult was requested and obtained from this/these consultant(s) Orthopedics, Notes from prior ED visits and [place] Controlled Substance Database    Pertinent labs & imaging results that were available during my care of the patient were reviewed by me and considered in my medical decision making (see chart for details).    ____________________________________________   FINAL CLINICAL IMPRESSION(S) / ED DIAGNOSES  Final diagnoses:  Fall, initial encounter  Closed fracture of right hip, initial encounter ([place])  Acute kidney injury superimposed on  chronic kidney disease ([place])      NEW MEDICATIONS STARTED DURING THIS VISIT:  ED Discharge Orders    None       Note:  This document was prepared using Dragon voice recognition software and may include unintentional dictation errors.    Alfred Levins, [place], MD 10/13/18 [phone]

## 2018-10-13 NOTE — TOC Benefit Eligibility Note (Signed)
Transition of Care [facility]) Benefit Eligibility Note    Patient Details  Name: [name] MRN: [id number] Date of Birth: 12-03-1934   Medication/Dose: Lovenox 46m once daily for 14 days  Covered?: No   Prescription Coverage Preferred Pharmacy: Any retail pharmacy  Spoke with Person/Company/Phone Number:: [place] with [employer]: [phone]  Prior Approval: Yes(PA required for name brand: [id number]  Deductible: Met  Additional Notes: Generic Enoxaparin covered with no PA required.  Estimated copay: $14.Dannette BarbaraPhone Number: [phone] [phone]/22/2020, 1:40 PM

## 2018-10-13 NOTE — Consult Note (Signed)
ORTHOPAEDIC CONSULTATION  REQUESTING PHYSICIAN: [name] Mandes, MD  Chief Complaint: Right hip pain  HPI: [name] is a 84 y.o. male who complains of right hip pain after falling at home yesterday.  His pain became more severe overnight and was brought to the emergency room this morning where exam and x-rays revealed a displaced subcapital fracture of the right hip.  This is a very vertical fracture and likely to do well with pinning.  Patient has a BK amputation on the left leg which will compromise his rehab.  I have examined the patient discussed treatment with the patient his wife and his son [name].  I recommend a hemiarthroplasty to eliminate the possibility of nonunion and avascular necrosis and allow him to rehabilitate.  Risks and benefits were discussed with them.  They wish to have me proceed today.  Has been cleared by the medical service.  He does have a cardiac and diabetic conditions.  Past Medical History:  Diagnosis Date  . Arthritis   . Atrial fibrillation ([place])   . Carcinoma of prostate ([place])   . CHF (congestive heart failure) ([place])   . Chronic kidney disease   . Diabetes mellitus without complication ([place])   . ED (erectile dysfunction)   . Frequent urination   . Hyperlipidemia   . Hypertension   . Moderate mitral insufficiency   . Peripheral vascular disease ([place])   . Pneumonia 04/2016  . Prostate cancer ([place])   . Sleep apnea    OSA--USE C-PAP  . Ulcer of left foot due to type 2 diabetes mellitus ([place])   . Urinary stress incontinence, male    Past Surgical History:  Procedure Laterality Date  . AMPUTATION Left 01/12/2017   Procedure: AMPUTATION BELOW KNEE;  Surgeon: Algernon Huxley, MD;  Location: [facility] ORS;  Service: General;  Laterality: Left;  . APLIGRAFT PLACEMENT Left 10/15/2016   Procedure: APLIGRAFT PLACEMENT;  Surgeon: Albertine Patricia, DPM;  Location: [facility] OR[facility];  Service: Podiatry;  Laterality: Left;  necrotic ulcer  . CHOLECYSTECTOMY    . EYE SURGERY  Bilateral    Cataract Extraction with IOL  . IRRIGATION AND DEBRIDEMENT FOOT Left 10/15/2016   Procedure: IRRIGATION AND DEBRIDEMENT FOOT-EXCISIONAL DEBRIDEMENT OF SKIN 3RD, 4TH AND 5TH TOES WITH APPLICATION OF APLIGRAFT;  Surgeon: Albertine Patricia, DPM;  Location: [facility];  Service: Podiatry;  Laterality: Left;  necrotic,gangrene  . IRRIGATION AND DEBRIDEMENT FOOT Left 12/09/2016   Procedure: IRRIGATION AND DEBRIDEMENT FOOT-MUSCLE/FASCIA, RESECTION OF FOURTH AND FIFTH METATARSAL NECROTIC BONE;  Surgeon: Albertine Patricia, DPM;  Location: [facility];  Service: Podiatry;  Laterality: Left;  . LEFT HEART CATH AND CORONARY ANGIOGRAPHY N/A 06/05/2018   Procedure: LEFT HEART CATH AND CORONARY ANGIOGRAPHY;  Surgeon: Yolonda Kida, MD;  Location: [place] [facility];  Service: Cardiovascular;  Laterality: N/A;  . LOWER EXTREMITY ANGIOGRAPHY Left 08/16/2016   Procedure: Lower Extremity Angiography;  Surgeon: Algernon Huxley, MD;  Location: [place] [facility];  Service: Cardiovascular;  Laterality: Left;  . LOWER EXTREMITY ANGIOGRAPHY Left 09/01/2016   Procedure: Lower Extremity Angiography;  Surgeon: Algernon Huxley, MD;  Location: [place] [facility];  Service: Cardiovascular;  Laterality: Left;  . LOWER EXTREMITY ANGIOGRAPHY Left 10/14/2016   Procedure: Lower Extremity Angiography;  Surgeon: Algernon Huxley, MD;  Location: [place] [facility];  Service: Cardiovascular;  Laterality: Left;  . LOWER EXTREMITY ANGIOGRAPHY Left 12/20/2016   Procedure: Lower Extremity Angiography;  Surgeon: Algernon Huxley, MD;  Location: [place] [facility];  Service: Cardiovascular;  Laterality: Left;  . LOWER EXTREMITY INTERVENTION  09/01/2016   Procedure: Lower Extremity Intervention;  Surgeon: Algernon Huxley, MD;  Location: [place] [facility];  Service: Cardiovascular;;  . PROSTATE SURGERY     removal  . TONSILLECTOMY     as a child   Social History   Socioeconomic History  . Marital status: Married    Spouse name: Not on file   . Number of children: Not on file  . Years of education: Not on file  . Highest education level: Not on file  Occupational History  . Occupation: retired  Scientific laboratory technician  . Financial resource strain: Not on file  . Food insecurity:    Worry: Not on file    Inability: Not on file  . Transportation needs:    Medical: Not on file    Non-medical: Not on file  Tobacco Use  . Smoking status: Former Smoker    Packs/day: 0.25    Types: Cigarettes    Last attempt to quit: 10/14/1994    Years since quitting: 24.0  . Smokeless tobacco: Never Used  Substance and Sexual Activity  . Alcohol use: No    Alcohol/week: 0.0 standard drinks  . Drug use: No  . Sexual activity: Not on file  Lifestyle  . Physical activity:    Days per week: Not on file    Minutes per session: Not on file  . Stress: Not on file  Relationships  . Social connections:    Talks on phone: Not on file    Gets together: Not on file    Attends religious service: Not on file    Active member of club or organization: Not on file    Attends meetings of clubs or organizations: Not on file    Relationship status: Not on file  Other Topics Concern  . Not on file  Social History Narrative  . Not on file   Family History  Problem Relation Age of Onset  . Hypertension Mother   . Diabetes Mother   . Prostate cancer Neg Hx   . Bladder Cancer Neg Hx   . Kidney disease Neg Hx    Allergies  Allergen Reactions  . Contrast Media [Iodinated Diagnostic Agents] Shortness Of Breath    SOB  . Iohexol Shortness Of Breath     Desc: Respiratory Distress, Laryngedema, diaphoresis, Onset Date: [id number]   . Metrizamide Shortness Of Breath    SOB SOB SOB   . Latex Rash   Prior to Admission medications   Medication Sig Start Date End Date Taking? Authorizing Provider  amiodarone (PACERONE) 200 MG tablet Take 200 mg by mouth daily. 09/13/18  Yes [provider]  atorvastatin (LIPITOR) 80 MG tablet Take 80 mg by mouth  every evening. 07/31/18  Yes [provider]  diltiazem (CARDIZEM CD) 120 MG 24 hr capsule Take 120 mg by mouth daily. 09/28/18  Yes [provider]  empagliflozin (JARDIANCE) 25 MG TABS tablet Take 25 mg by mouth daily.   Yes [provider]  furosemide (LASIX) 20 MG tablet Take 1 tablet (20 mg total) by mouth daily. 01/18/18  Yes [name], MD  HUMALOG KWIKPEN 100 UNIT/ML KwikPen Inject 3 Units into the skin 3 (three) times daily with meals. 05/11/18  Yes [provider]  insulin glargine (LANTUS) 100 UNIT/ML injection Inject 0.1 mLs (10 Units total) into the skin at bedtime. Patient taking differently: Inject 15 Units into the skin at bedtime.  10/28/17  Yes Mody, Sital, MD  insulin lispro (HUMALOG) 100 UNIT/ML injection Inject 0.03 mLs (3 Units total) into the skin 3 (three) times daily with meals. Patient taking differently: Inject 3 Units into the skin 3 (three) times daily with meals. Plus 1 additional unit for every 50 BG points over 200 (max 5 additional units per dose) 11/28/17  Yes Wieting, Richard, MD  metoprolol tartrate (LOPRESSOR) 25 MG tablet Take 1 tablet (25 mg total) by mouth 3 (three) times daily. 11/28/17  Yes Wieting, Richard, MD  nitroGLYCERIN (NITROSTAT) 0.4 MG SL tablet Place 1 tablet (0.4 mg total) under the tongue every 5 (five) minutes as needed for chest pain. 06/09/18  Yes Sudini, Alveta Heimlich, MD  potassium chloride SA (K-DUR,KLOR-CON) 20 MEQ tablet Take 20 mEq by mouth daily.   Yes [provider]  ranolazine (RANEXA) 500 MG 12 hr tablet Take 1 tablet (500 mg total) by mouth 2 (two) times daily. 06/09/18  Yes Sudini, Alveta Heimlich, MD  amiodarone (PACERONE) 400 MG tablet Take 1 tablet (400 mg total) by mouth 2 (two) times daily. Patient not taking: Reported on 10/13/2018 06/09/18   Hillary Bow, MD  doxycycline (VIBRAMYCIN) 100 MG capsule Take 1 capsule (100 mg total) by mouth 2 (two) times daily. Patient not taking: Reported on 10/13/2018  07/27/18   Kris Hartmann, NP   Dg Chest 1 View  Result Date: 10/13/2018 CLINICAL DATA:  Fall yesterday. EXAM: CHEST  1 VIEW COMPARISON:  Radiograph of June 02, 2018. FINDINGS: Stable cardiomegaly. No pneumothorax or pleural effusion is noted. No acute pulmonary disease is noted. Bony thorax is unremarkable. IMPRESSION: No active disease. Electronically Signed   By: Marijo Conception M.D.   On: 10/13/2018 08:40   Dg Hip Unilat W Or Wo Pelvis 2-3 Views Right  Result Date: 10/13/2018 CLINICAL DATA:  Right leg pain after fall. EXAM: DG HIP (WITH OR WITHOUT PELVIS) 2-3V RIGHT COMPARISON:  None. FINDINGS: Mildly displaced proximal right femoral neck fracture is noted. No dislocation is noted. No significant degenerative changes noted. Vascular calcifications are noted. IMPRESSION: Mildly displaced proximal right femoral neck fracture. Electronically Signed   By: Marijo Conception M.D.   On: 10/13/2018 08:39    Positive ROS: All other systems have been reviewed and were otherwise negative with the exception of those mentioned in the HPI and as above.  Physical Exam: General: Alert, no acute distress Cardiovascular: No pedal edema Respiratory: No cyanosis, no use of accessory musculature GI: No organomegaly, abdomen is soft and non-tender Skin: No lesions in the area of chief complaint Neurologic: Sensation intact distally Psychiatric: Patient is competent for consent with normal mood and affect Lymphatic: No axillary or cervical lymphadenopathy  MUSCULOSKELETAL: Patient alert and oriented.  Sitting in the bed.  Right leg is shortened and externally rotated.  He is unable to move it.  Neurovascular status good distally.  No other injuries are noted.  He has a left BK amputation.  Assessment: This placed right subcapital hip fracture  Plan: Right hip hemiarthroplasty.    Park Breed, MD [phone]   10/13/2018 1:42 PM

## 2018-10-13 NOTE — ED Triage Notes (Signed)
Pt to ED via [facility] for fall yesterday in a parking lot. Pt reports that his left artifical leg gave out and he landed on the right side. Pt reports sudden pain the right side. Pt unable to straighten right leg out, on arrival to ED pt c/o pain in his back. Pt having pain with manipulation of right leg. Pt denies hitting his head.

## 2018-10-13 NOTE — Anesthesia Preprocedure Evaluation (Addendum)
Anesthesia Evaluation  Patient identified by MRN, date of birth, ID band Patient awake    Reviewed: Allergy & Precautions, NPO status , Patient's Chart, lab work & pertinent test results  History of Anesthesia Complications Negative for: history of anesthetic complications  Airway Mallampati: III  TM Distance: >3 FB Neck ROM: Full    Dental  (+) Upper Dentures   Pulmonary shortness of breath, sleep apnea , pneumonia, resolved, former smoker,    breath sounds clear to auscultation- rhonchi (-) wheezing      Cardiovascular hypertension, Pt. on medications + Past MI, + Peripheral Vascular Disease and +CHF  + dysrhythmias Atrial Fibrillation  Rhythm:Regular Rate:Normal - Systolic murmurs and - Diastolic murmurs Echo 79/39/03: - Left ventricle: The cavity size was normal. Systolic function was   normal. The estimated ejection fraction was in the range of 60%   to 65%. - Aortic valve: Valve area (Vmax): 2.1 cm^2. - Mitral valve: There was moderate regurgitation. - Left atrium: The atrium was mildly dilated.   Neuro/Psych negative neurological ROS  negative psych ROS   GI/Hepatic negative GI ROS, Neg liver ROS,   Endo/Other  diabetes, Oral Hypoglycemic Agents  Renal/GU Renal InsufficiencyRenal disease     Musculoskeletal  (+) Arthritis ,   Abdominal (+) - obese,   Peds negative pediatric ROS (+)  Hematology negative hematology ROS (+)   Anesthesia Other Findings Past Medical History: No date: Arthritis No date: Atrial fibrillation (HCC) No date: Carcinoma of prostate (HCC) No date: CHF (congestive heart failure) (HCC) No date: Chronic kidney disease No date: Diabetes mellitus without complication (HCC) No date: ED (erectile dysfunction) No date: Frequent urination No date: Hyperlipidemia No date: Hypertension No date: Moderate mitral insufficiency No date: Peripheral vascular disease ([place]) 04/2016:  Pneumonia No date: Prostate cancer ([place]) No date: Sleep apnea     Comment:  OSA--USE C-PAP No date: Ulcer of left foot due to type 2 diabetes mellitus ([place]) No date: Urinary stress incontinence, male   Reproductive/Obstetrics                             Anesthesia Physical  Anesthesia Plan  ASA: III  Anesthesia Plan: General   Post-op Pain Management:    Induction: Intravenous  PONV Risk Score and Plan: 1 and Ondansetron and Dexamethasone  Airway Management Planned: Oral ETT  Additional Equipment:   Intra-op Plan:   Post-operative Plan: Extubation in OR  Informed Consent: I have reviewed the patients History and Physical, chart, labs and discussed the procedure including the risks, benefits and alternatives for the proposed anesthesia with the patient or authorized representative who has indicated his/her understanding and acceptance.     Dental advisory given and Consent reviewed with POA  Plan Discussed with: CRNA and Anesthesiologist  Anesthesia Plan Comments: (Dr. Nehemiah Massed placed patient on a medication { possible blood thinner ) not identified.. but coags are within normal.  According to notes, Nehemiah Massed had patient on Xarelto back in Feb.)      Anesthesia Quick Evaluation

## 2018-10-13 NOTE — TOC Initial Note (Addendum)
Transition of Care [facility]) - Initial/Assessment Note    Patient Details  Name: [name] MRN: [id number] Date of Birth: 29-Mar-1935  Transition of Care [facility] CM/SW Contact:    Katrina Stack, RN Phone Number: 10/13/2018, 10:34 AM  Clinical Narrative:                Patient fell at gas station when he got out of the car to go to the bathroom. Says he did not have his walker in the car. He was driving and wife was passenger. Independent in all adls, denies issues accessing medical care, obtaining medications or with transportation.  Current with  PCP.  Followed by [facility] cardiology. Patient has prosthesis on left leg and now has fracture on the right. Completed pasrr and FL2 in the event patient needs snf. Patient and wife hope patient can discharge home with home health but there is a possible need for snf. COVID Test performed today is negative  Expected Discharge Plan: (Home with HH vs SNF) Barriers to Discharge: No Barriers Identified   Patient Goals and CMS Choice Patient states their goals for this hospitalization and ongoing recovery are:: Get hip fixed   Choice offered to / list presented to : NA(NA at present)  Expected Discharge Plan and Services Expected Discharge Plan: (Home with [facility] vs SNF) In-house Referral: Clinical Social Work Discharge Planning Services: CM Consult Post Acute Care Choice: NA(Patient and wife hopes to discharge home with home health) Living arrangements for the past 2 months: Single Family Home                 DME Arranged: N/A DME Agency: NA       HH Arranged: NA HH Agency: NA        Prior Living Arrangements/Services Living arrangements for the past 2 months: Single Family Home Lives with:: Spouse Patient language and need for interpreter reviewed:: No Do you feel safe going back to the place where you live?: Yes      Need for Family Participation in Patient Care: Yes (Comment) Care giver support system in place?: Yes (comment) Current  home services: (Has walker, wheelchair and bedside commode per wife and patient)    Activities of Daily Living      Permission Sought/Granted Permission sought to share information with : Case Manager, Family Supports Permission granted to share information with : Yes, Verbal Permission Granted  Share Information with NAME: Wife           Emotional Assessment Appearance:: Appears stated age Attitude/Demeanor/Rapport: Gracious Affect (typically observed): Accepting, Calm Orientation: : Oriented to Self, Oriented to Place, Oriented to  Time Alcohol / Substance Use: Not Applicable Psych Involvement: No (comment)  Admission diagnosis:  fall Patient Active Problem List   Diagnosis Date Noted  . Femur fracture ([place]) 10/13/2018  . Chest pain 06/03/2018  . NSTEMI (non-ST elevated myocardial infarction) ([place]) 01/16/2018  . Acute CHF (congestive heart failure) ([place]) 11/23/2017  . Atherosclerosis of native arteries of the extremities with ulceration ([place]) 11/18/2017  . Atrial fibrillation with rapid ventricular response ([place]) 10/15/2017  . BKA stump complication ([place]) 19/62/2297  . 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 S/P BKA (below knee amputation) unilateral, left ([place]) 02/01/2017  . Chronic diastolic heart failure ([place]) 01/21/2017  . Pressure injury of skin 01/18/2017  . Atherosclerosis of artery of extremity with gangrene ([place]) 01/05/2017  . Diabetic foot ulcer ([place]) 12/29/2016  . Ataxia 10/21/2016  . History of  femoral angiogram 09/06/2016  . Left leg pain 09/06/2016  . Leukocytosis 09/06/2016  . Generalized weakness 09/06/2016  . Atrial fibrillation, chronic 08/30/2016  . Pneumonia 05/19/2016  . Hyperlipidemia 04/20/2016  . Back pain 04/19/2016  . Type 2 diabetes mellitus treated with insulin ([place]) 04/19/2016  . Essential hypertension 04/19/2016  . PAD (peripheral artery disease) ([place]) 04/19/2016  . Erectile dysfunction following radical  prostatectomy 06/25/2015  . History of prostate cancer 12/23/2014  . Incontinence 12/23/2014   PCP:  Ezequiel Kayser, MD Pharmacy:   [facility] [address] [place] [address] Phone: [phone] Fax: [phone]  [facility] [place], [place] [address] [place] [address] Phone: [phone] Fax: [phone]     Social Determinants of Health (SDOH) Interventions    Readmission Risk Interventions No flowsheet data found.

## 2018-10-13 NOTE — H&P (Signed)
THE PATIENT WAS SEEN PRIOR TO SURGERY TODAY.  HISTORY, ALLERGIES, HOME MEDICATIONS AND OPERATIVE PROCEDURE WERE REVIEWED. RISKS AND BENEFITS OF SURGERY DISCUSSED WITH PATIENT AGAIN.  NO CHANGES FROM INITIAL HISTORY AND PHYSICAL NOTED.    

## 2018-10-13 NOTE — Transfer of Care (Addendum)
Immediate Anesthesia Transfer of Care Note  Patient: [name]  Procedure(s) Performed: ARTHROPLASTY BIPOLAR HIP (HEMIARTHROPLASTY) (Right )  Patient Location: PACU  Anesthesia Type:Spinal  Level of Consciousness: awake, alert  and oriented  Airway & Oxygen Therapy: Patient Spontanous Breathing and Patient connected to nasal cannula oxygen  Post-op Assessment: Report given to RN and Post -op Vital signs reviewed and stable  Post vital signs: Reviewed and stable  Last Vitals:  Vitals Value Taken Time  BP 175/74 10/13/2018  6:10 PM  Temp 36.6 C 10/13/2018  6:10 PM  Pulse 63 10/13/2018  6:21 PM  Resp 13 10/13/2018  6:21 PM  SpO2 96 % 10/13/2018  6:21 PM  Vitals shown include unvalidated device data.  Last Pain:  Vitals:   10/13/18 1810  TempSrc:   PainSc: Asleep      Patients Stated Pain Goal: 4 (58/25/18 9842)  Complications: No apparent anesthesia complications

## 2018-10-13 NOTE — Progress Notes (Signed)
Family Meeting Note  Advance Directive:no  Today a meeting took place with the pt in the ER  Patient came in with mechanical fall sustained a right hip fracture. He has multiple medical comorbidities including hypertension, CAD and chronic a fib. Currently no chest pain or shortness of breath. Patient is scheduled for surgery this afternoon. Code status address with patient he wishes to be full code. Discussed with patient's wife [name] on the phone and updated. Time spent during discussion: 16 mins  [name] Mandes, MD

## 2018-10-13 NOTE — ED Notes (Signed)
Patient transported to X-ray 

## 2018-10-13 NOTE — Anesthesia Post-op Follow-up Note (Signed)
Anesthesia QCDR form completed.        

## 2018-10-13 NOTE — NC FL2 (Signed)
[place] LEVEL OF CARE SCREENING TOOL     IDENTIFICATION  Patient Name: [name] Birthdate: [birth year] Sex: male Admission Date (Current Location): 10/13/2018  [place] and [place] Number:  Engineering geologist and Address:  [facility], [address], [place], [place] [address]      Provider Number: [id number]  Attending Physician Name and Address:  [name] Mandes, MD  Relative Name and Phone Number:       Current Level of Care: Hospital Recommended Level of Care: [place] Prior Approval Number:    Date Approved/Denied:   PASRR Number: [id number]  Discharge Plan: SNF    Current Diagnoses: Patient Active Problem List   Diagnosis Date Noted  . Femur fracture ([place]) 10/13/2018  . Chest pain 06/03/2018  . NSTEMI (non-ST elevated myocardial infarction) ([place]) 01/16/2018  . Acute CHF (congestive heart failure) ([place]) 11/23/2017  . Atherosclerosis of native arteries of the extremities with ulceration ([place]) 11/18/2017  . Atrial fibrillation with rapid ventricular response ([place]) 10/15/2017  . BKA stump complication ([place]) 56/21/3086  . 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 S/P BKA (below knee amputation) unilateral, left ([place]) 02/01/2017  . Chronic diastolic heart failure ([place]) 01/21/2017  . Pressure injury of skin 01/18/2017  . Atherosclerosis of artery of extremity with gangrene ([place]) 01/05/2017  . Diabetic foot ulcer ([place]) 12/29/2016  . Ataxia 10/21/2016  . History of femoral angiogram 09/06/2016  . Left leg pain 09/06/2016  . Leukocytosis 09/06/2016  . Generalized weakness 09/06/2016  . Atrial fibrillation, chronic 08/30/2016  . Pneumonia 05/19/2016  . Hyperlipidemia 04/20/2016  . Back pain 04/19/2016  . Type 2 diabetes mellitus treated with insulin ([place]) 04/19/2016  . Essential hypertension 04/19/2016  . PAD (peripheral artery disease) ([place]) 04/19/2016  . Erectile  dysfunction following radical prostatectomy 06/25/2015  . History of prostate cancer 12/23/2014  . Incontinence 12/23/2014    Orientation RESPIRATION BLADDER Height & Weight     Self, Time, Situation, Place  Normal Continent Weight: 79.4 kg Height:     BEHAVIORAL SYMPTOMS/MOOD NEUROLOGICAL BOWEL NUTRITION STATUS  (None) (none) Continent Diet  AMBULATORY STATUS COMMUNICATION OF NEEDS Skin   Limited Assist   Surgical wounds(right hip)                       Personal Care Assistance Level of Assistance  Bathing, Dressing Bathing Assistance: Limited assistance   Dressing Assistance: Limited assistance     Functional Limitations Info  (none)          SPECIAL CARE FACTORS FREQUENCY  PT (By licensed PT), OT (By licensed OT)     PT Frequency: 5-7 x week OT Frequency: 5-7 x week            Contractures Contractures Info: Not present    Additional Factors Info  Code Status Code Status Info: Full             Current Medications (10/13/2018):  This is the current hospital active medication list Current Facility-Administered Medications  Medication Dose Route Frequency Provider Last Rate Last Dose  . ceFAZolin (ANCEF) IVPB 1 g/50 mL premix  1 g Intravenous On Call to OR Earnestine Leys, MD      . clindamycin (CLEOCIN) IVPB 600 mg  600 mg Intravenous On Call to OR Earnestine Leys, MD      . insulin aspart (novoLOG) injection 0-5 Units  0-5 Units Subcutaneous QHS [name] Mandes, MD      .  insulin aspart (novoLOG) injection 0-9 Units  0-9 Units Subcutaneous TID WC [name] Mandes, MD       Current Outpatient Medications  Medication Sig Dispense Refill  . amiodarone (PACERONE) 200 MG tablet Take 200 mg by mouth daily.    Marland Kitchen atorvastatin (LIPITOR) 80 MG tablet Take 80 mg by mouth every evening.    . diltiazem (CARDIZEM CD) 120 MG 24 hr capsule Take 120 mg by mouth daily.    . empagliflozin (JARDIANCE) 25 MG TABS tablet Take 25 mg by mouth daily.    . furosemide (LASIX) 20 MG  tablet Take 1 tablet (20 mg total) by mouth daily. 30 tablet 2  . HUMALOG KWIKPEN 100 UNIT/ML KwikPen Inject 3 Units into the skin 3 (three) times daily with meals.    . insulin glargine (LANTUS) 100 UNIT/ML injection Inject 0.1 mLs (10 Units total) into the skin at bedtime. (Patient taking differently: Inject 15 Units into the skin at bedtime. ) 10 mL 11  . insulin lispro (HUMALOG) 100 UNIT/ML injection Inject 0.03 mLs (3 Units total) into the skin 3 (three) times daily with meals. (Patient taking differently: Inject 3 Units into the skin 3 (three) times daily with meals. Plus 1 additional unit for every 50 BG points over 200 (max 5 additional units per dose)) 10 mL 0  . metoprolol tartrate (LOPRESSOR) 25 MG tablet Take 1 tablet (25 mg total) by mouth 3 (three) times daily. 90 tablet 0  . nitroGLYCERIN (NITROSTAT) 0.4 MG SL tablet Place 1 tablet (0.4 mg total) under the tongue every 5 (five) minutes as needed for chest pain. 20 tablet 0  . potassium chloride SA (K-DUR,KLOR-CON) 20 MEQ tablet Take 20 mEq by mouth daily.    . ranolazine (RANEXA) 500 MG 12 hr tablet Take 1 tablet (500 mg total) by mouth 2 (two) times daily. 60 tablet 0  . amiodarone (PACERONE) 400 MG tablet Take 1 tablet (400 mg total) by mouth 2 (two) times daily. (Patient not taking: Reported on 10/13/2018) 60 tablet 0  . doxycycline (VIBRAMYCIN) 100 MG capsule Take 1 capsule (100 mg total) by mouth 2 (two) times daily. (Patient not taking: Reported on 10/13/2018) 20 capsule 0     Discharge Medications: Please see discharge summary for a list of discharge medications.  Relevant Imaging Results:  Relevant Lab Results:   Additional Information Has chronic prosthesis left lower extremitry  Katrina Stack, RN

## 2018-10-13 NOTE — ED Notes (Signed)
Patient placed on 2L O2 due to fentanyl dropping patients O2 sats to 85%

## 2018-10-13 NOTE — Anesthesia Procedure Notes (Signed)
Procedure Name: Intubation Date/Time: 10/13/2018 3:45 PM Performed by: Allean Found, CRNA Pre-anesthesia Checklist: Patient identified, Patient being monitored, Timeout performed, Emergency Drugs available and Suction available Patient Re-evaluated:Patient Re-evaluated prior to induction Oxygen Delivery Method: Circle system utilized Preoxygenation: Pre-oxygenation with 100% oxygen Induction Type: IV induction Ventilation: Mask ventilation without difficulty Laryngoscope Size: Mac and 4 Grade View: Grade I Tube type: Oral Tube size: 7.0 mm Number of attempts: 1 Airway Equipment and Method: Stylet Placement Confirmation: ETT inserted through vocal cords under direct vision,  positive ETCO2 and breath sounds checked- equal and bilateral Secured at: 22 cm Tube secured with: Tape Dental Injury: Teeth and Oropharynx as per pre-operative assessment  Difficulty Due To: Difficult Airway- due to large tongue Comments: DL X 2, poor view the first time.  Tongue protruding.  Mac 4 repositioned, better view

## 2018-10-14 LAB — BASIC METABOLIC PANEL
Anion gap: 9 (ref 5–15)
BUN: 31 mg/dL — ABNORMAL HIGH (ref 8–23)
CO2: 22 mmol/L (ref 22–32)
Calcium: 8.2 mg/dL — ABNORMAL LOW (ref 8.9–10.3)
Chloride: 108 mmol/L (ref 98–111)
Creatinine, Ser: 1.62 mg/dL — ABNORMAL HIGH (ref 0.61–1.24)
GFR calc Af Amer: 45 mL/min — ABNORMAL LOW (ref >60)
GFR calc non Af Amer: 39 mL/min — ABNORMAL LOW (ref >60)
Glucose, Bld: 128 mg/dL — ABNORMAL HIGH (ref 70–99)
Potassium: 4.5 mmol/L (ref 3.5–5.1)
Sodium: 139 mmol/L (ref 135–145)

## 2018-10-14 LAB — CBC
HCT: 38.1 % — ABNORMAL LOW (ref 39.0–52.0)
Hemoglobin: 12.7 g/dL — ABNORMAL LOW (ref 13.0–17.0)
MCH: 29.3 pg (ref 26.0–34.0)
MCHC: 33.3 g/dL (ref 30.0–36.0)
MCV: 87.8 fL (ref 80.0–100.0)
Platelets: 173 10*3/uL (ref 150–400)
RBC: 4.34 MIL/uL (ref 4.22–5.81)
RDW: 14.9 % (ref 11.5–15.5)
WBC: 12.7 10*3/uL — ABNORMAL HIGH (ref 4.0–10.5)
nRBC: 0 % (ref 0.0–0.2)

## 2018-10-14 LAB — GLUCOSE, CAPILLARY
Glucose-Capillary: 119 mg/dL — ABNORMAL HIGH (ref 70–99)
Glucose-Capillary: 187 mg/dL — ABNORMAL HIGH (ref 70–99)
Glucose-Capillary: 129 mg/dL — ABNORMAL HIGH (ref 70–99)
Glucose-Capillary: 165 mg/dL — ABNORMAL HIGH (ref 70–99)

## 2018-10-14 MED ORDER — RIVAROXABAN 15 MG PO TABS
15.0000 mg | ORAL_TABLET | Freq: Every day | ORAL | Status: DC
  Administered 2018-10-14 – 2018-10-17 (×4): 15 mg via ORAL
  Filled 2018-10-14 (×5): qty 1

## 2018-10-14 NOTE — Progress Notes (Signed)
Subjective: 1 Day Post-Op Procedure(s) (LRB): ARTHROPLASTY BIPOLAR HIP (HEMIARTHROPLASTY) (Right)    Patient reports pain as moderate.  Awake and alert lying in bed.  Baseline hip pain is better but hurts to move.  PT started with exercises today.  Rehab will be slow due to his amputation on the opposite leg.  Hemoglobin stable.  Objective:   VITALS:   Vitals:   10/14/18 0312 10/14/18 0637  BP: 132/72 118/60  Pulse: 66 62  Resp: 19 18  Temp: 98.6 F (37 C) 98 F (36.7 C)  SpO2: 95% 91%    Neurologically intact ABD soft Neurovascular intact Sensation intact distally Intact pulses distally Dorsiflexion/Plantar flexion intact Incision: no drainage  LABS Recent Labs    10/13/18 0836 10/14/18 0642  HGB 15.0 12.7*  HCT 43.6 38.1*  WBC 13.6* 12.7*  PLT 223 173    Recent Labs    10/13/18 0836 10/14/18 0642  NA 137 139  K 4.2 4.5  BUN 38* 31*  CREATININE 2.03* 1.62*  GLUCOSE 242* 128*    Recent Labs    10/13/18 0836  INR 1.2     Assessment/Plan: 1 Day Post-Op Procedure(s) (LRB): ARTHROPLASTY BIPOLAR HIP (HEMIARTHROPLASTY) (Right)   Advance diet Up with therapy D/C IV fluids Discharge to SNF

## 2018-10-14 NOTE — Progress Notes (Signed)
Patient sleeping in bed, dressing clean, dry and intact. Complains of pain if moving or turning in bed. NAD. Continue to monitor.

## 2018-10-14 NOTE — Progress Notes (Signed)
ANTICOAGULATION CONSULT NOTE - Initial Consult  Pharmacy Consult for Xarelto Indication: atrial fibrillation  Allergies  Allergen Reactions  . Contrast Media [Iodinated Diagnostic Agents] Shortness Of Breath    SOB  . Iohexol Shortness Of Breath     Desc: Respiratory Distress, Laryngedema, diaphoresis, Onset Date: [id number]   . Metrizamide Shortness Of Breath    SOB SOB SOB   . Latex Rash    Patient Measurements: Weight: 175 lb 0.7 oz (79.4 kg)  Vital Signs: Temp: 98 F (36.7 C) (05/23 0637) Temp Source: Oral (05/23 0637) BP: 118/60 (05/23 0637) Pulse Rate: 62 (05/23 0637)  Labs: Recent Labs    10/13/18 0836 10/14/18 0642  HGB 15.0 12.7*  HCT 43.6 38.1*  PLT 223 173  APTT 26  --   LABPROT 14.9  --   INR 1.2  --   CREATININE 2.03* 1.62*    Estimated Creatinine Clearance: 36.1 mL/min (A) (by C-G formula based on SCr of 1.62 mg/dL (H)).   Assessment: 84 yo male with hx of AFib on Xarelto in the past. Per hospitalist, cardiology (here and outpt note) recommending restarting anticoagulation and ortho ok with restarting anticoagulation. Benefit outweighs risk per MD. Pt noted to be on Xarelto 20 mg daily with dinner in the past.   TBW CrCl 40.1 ml/min   Goal of Therapy:  Monitor platelets by anticoagulation protocol: Yes   Plan:  D/c enoxaparin ppx Will renally adjust Xarelto to 15 mg mg PO daily with supper  CBC and SCr at least every three days per policy  Pharmacy will continue to follow.   Rayna Sexton L 10/14/2018,9:18 AM

## 2018-10-14 NOTE — Progress Notes (Addendum)
PT Cancellation Note  Patient Details Name: [name] MRN: [id number] DOB: 20-Feb-1935   Cancelled Treatment:    Reason Eval/Treat Not Completed: Fatigue/lethargy limiting ability to participate;Patient's level of consciousness(PT attempting BID session. Pt asleep, breathign heavily, lights on in room, lunch tray untouched, unable to rouse patient. RN reports pt has been sleeping since late morning. Will resume treatment at later date/time when patient able to participate. )  4:37 PM, 10/14/18 Etta Grandchild, PT, DPT Physical Therapist - [facility]  [phone] [facility])     Sully C 10/14/2018, 4:37 PM

## 2018-10-14 NOTE — Plan of Care (Signed)
  Problem: Education: Goal: Verbalization of understanding the information provided (i.e., activity precautions, restrictions, etc) will improve Outcome: Progressing Goal: Individualized Educational Video(s) Outcome: Progressing   Problem: Activity: Goal: Ability to ambulate and perform ADLs will improve Outcome: Progressing   Problem: Clinical Measurements: Goal: Postoperative complications will be avoided or minimized Outcome: Progressing   Problem: Self-Concept: Goal: Ability to maintain and perform role responsibilities to the fullest extent possible will improve Outcome: Progressing   Problem: Pain Management: Goal: Pain level will decrease Outcome: Progressing   Problem: Education: Goal: Knowledge of General Education information will improve Description: Including pain rating scale, medication(s)/side effects and non-pharmacologic comfort measures Outcome: Progressing   Problem: Health Behavior/Discharge Planning: Goal: Ability to manage health-related needs will improve Outcome: Progressing   Problem: Clinical Measurements: Goal: Ability to maintain clinical measurements within normal limits will improve Outcome: Progressing Goal: Will remain free from infection Outcome: Progressing Goal: Diagnostic test results will improve Outcome: Progressing Goal: Respiratory complications will improve Outcome: Progressing Goal: Cardiovascular complication will be avoided Outcome: Progressing   Problem: Activity: Goal: Risk for activity intolerance will decrease Outcome: Progressing   Problem: Nutrition: Goal: Adequate nutrition will be maintained Outcome: Progressing   Problem: Coping: Goal: Level of anxiety will decrease Outcome: Progressing   Problem: Elimination: Goal: Will not experience complications related to bowel motility Outcome: Progressing Goal: Will not experience complications related to urinary retention Outcome: Progressing   Problem: Pain  Managment: Goal: General experience of comfort will improve Outcome: Progressing   Problem: Safety: Goal: Ability to remain free from injury will improve Outcome: Progressing   Problem: Skin Integrity: Goal: Risk for impaired skin integrity will decrease Outcome: Progressing   

## 2018-10-14 NOTE — Progress Notes (Signed)
[facility] [place] NAME: [name]    MR#:  [id number]  DATE OF BIRTH:  13-May-1935  SUBJECTIVE:   Patient postop day one overall doing well. Hip pain. REVIEW OF SYSTEMS:   Review of Systems  Constitutional: Negative for chills, fever and weight loss.  HENT: Negative for ear discharge, ear pain and nosebleeds.   Eyes: Negative for blurred vision, pain and discharge.  Respiratory: Negative for sputum production, shortness of breath, wheezing and stridor.   Cardiovascular: Negative for chest pain, palpitations, orthopnea and PND.  Gastrointestinal: Negative for abdominal pain, diarrhea, nausea and vomiting.  Genitourinary: Negative for frequency and urgency.  Musculoskeletal: Positive for joint pain. Negative for back pain.  Neurological: Negative for sensory change, speech change, focal weakness and weakness.  Psychiatric/Behavioral: Negative for depression and hallucinations. The patient is not nervous/anxious.    Tolerating Diet:yes Tolerating PT: pending  DRUG ALLERGIES:   Allergies  Allergen Reactions  . Contrast Media [Iodinated Diagnostic Agents] Shortness Of Breath    SOB  . Iohexol Shortness Of Breath     Desc: Respiratory Distress, Laryngedema, diaphoresis, Onset Date: [id number]   . Metrizamide Shortness Of Breath    SOB SOB SOB   . Latex Rash    VITALS:  Blood pressure 118/60, pulse 62, temperature 98 F (36.7 C), temperature source Oral, resp. rate 18, weight 79.4 kg, SpO2 91 %.  PHYSICAL EXAMINATION:   Physical Exam  GENERAL:  84 y.o.-year-old patient lying in the bed with no acute distress.  EYES: Pupils equal, round, reactive to light and accommodation. No scleral icterus. Extraocular muscles intact.  HEENT: Head atraumatic, normocephalic. Oropharynx and nasopharynx clear.  NECK:  Supple, no jugular venous distention. No thyroid enlargement, no tenderness.  LUNGS: Normal breath sounds  bilaterally, no wheezing, rales, rhonchi. No use of accessory muscles of respiration.  CARDIOVASCULAR: S1, S2 normal. No murmurs, rubs, or gallops.  ABDOMEN: Soft, nontender, nondistended. Bowel sounds present. No organomegaly or mass.  EXTREMITIES: No cyanosis, clubbing or edema b/l.   Right hip dressing present NEUROLOGIC: Cranial nerves II through XII are intact. No focal Motor or sensory deficits b/l.   PSYCHIATRIC:  patient is alert and oriented x 3.  SKIN: No obvious rash, lesion, or ulcer.   LABORATORY PANEL:  CBC Recent Labs  Lab 10/14/18 0642  WBC 12.7*  HGB 12.7*  HCT 38.1*  PLT 173    Chemistries  Recent Labs  Lab 10/14/18 0642  NA 139  K 4.5  CL 108  CO2 22  GLUCOSE 128*  BUN 31*  CREATININE 1.62*  CALCIUM 8.2*   Cardiac Enzymes No results for input(s): TROPONINI in the last 168 hours. RADIOLOGY:  Dg Chest 1 View  Result Date: 10/13/2018 CLINICAL DATA:  Fall yesterday. EXAM: CHEST  1 VIEW COMPARISON:  Radiograph of June 02, 2018. FINDINGS: Stable cardiomegaly. No pneumothorax or pleural effusion is noted. No acute pulmonary disease is noted. Bony thorax is unremarkable. IMPRESSION: No active disease. Electronically Signed   By: Marijo Conception M.D.   On: 10/13/2018 08:40   Dg Pelvis Portable  Result Date: 10/13/2018 CLINICAL DATA:  Pain EXAM: PORTABLE PELVIS 1-2 VIEWS COMPARISON:  10/13/2018 FINDINGS: There are expected postsurgical changes related to total hip arthroplasty on the right. There are overlying skin staples and subcutaneous gas. Extensive vascular calcifications are noted. The alignment appears near anatomic. There are degenerative changes of the left hip. IMPRESSION: Expected postsurgical changes status post total hip  arthroplasty on the right. Electronically Signed   By: Constance Holster M.D.   On: 10/13/2018 19:36   Dg Hip Port Unilat With Pelvis 1v Right  Result Date: 10/13/2018 CLINICAL DATA:  Hip replacement.  Hip fracture. EXAM: DG HIP  (WITH OR WITHOUT PELVIS) 1V PORT RIGHT COMPARISON:  10/13/2018 FINDINGS: A single cross-table view of the hip was obtained. The patient is now status post total hip arthroplasty. Evaluation is limited by lack of additional views. There are skin staples and subcutaneous soft tissue swelling and gas. There are few linear densities at the level of the humeral head of unknown clinical significance. IMPRESSION: Limited view of the right hip status post total hip arthroplasty. Electronically Signed   By: Constance Holster M.D.   On: 10/13/2018 19:35   Dg Hip Unilat W Or Wo Pelvis 2-3 Views Right  Result Date: 10/13/2018 CLINICAL DATA:  Right leg pain after fall. EXAM: DG HIP (WITH OR WITHOUT PELVIS) 2-3V RIGHT COMPARISON:  None. FINDINGS: Mildly displaced proximal right femoral neck fracture is noted. No dislocation is noted. No significant degenerative changes noted. Vascular calcifications are noted. IMPRESSION: Mildly displaced proximal right femoral neck fracture. Electronically Signed   By: Marijo Conception M.D.   On: 10/13/2018 08:39   ASSESSMENT AND PLAN:  [name]  is a 84 y.o. male with a known history of atrial fibrillation, chronic is supposed to be on Xarelto how were not taking it since January, chronic congestive heart failure systolic, diabetes on insulin, chronic kidney disease stage III comes to the emergency room after he had a mechanical fall while he was in the parking lot his leg gave out and started hurting on his right hip.  1. Right hip fracture/femoral neck fracture status post mechanical fall -postop day one right hemi arthroplasty -Dr. Sabra Heck consult appreciated -patient has good functional capacity. He uses walker/cane. He has a left prosthetic leg. -Continue beta blockers perioperative  2. Chronic atrial fibrillation -continue metoprolol, diltiazem, amiodarone -patient is supposed to be on Xarelto. It was held in January after he had a intraventricular hemorrhage post  TPA. Patient went for follow-up with Dr. Nehemiah Massed on 218 2020 and was asked to resume Xarelto however he has not been taking it. This was confirmed with patient's wife [name] also that he is not taking Xarelto -will need to resume Xarelto-- consider cardiology and agrees with plan -hemoglobin stable  3. Chronic systolic congestive heart failure -resume Lasix -patient not in heart failure -last echo in January showed EF of 45 to 50%  4. Type II diabetes on insulin -sliding scale while in house -will resume Lantus and NovoLog after surgery  5. Hypertension resume home meds  6. DVT prophylaxis SCD still surgery  Physical therapy to see patient today  CODE STATUS: *full DVT Prophylaxis: Xarelto  TOTAL TIME TAKING CARE OF THIS PATIENT: **30* minutes.  >50% time spent on counselling and coordination of care  POSSIBLE D/C IN *2 to 3** DAYS, DEPENDING ON CLINICAL CONDITION.  Note: This dictation was prepared with Dragon dictation along with smaller phrase technology. Any transcriptional errors that result from this process are unintentional.  [name] Mandes M.D on 10/14/2018 at 2:30 PM  Between 7am to 6pm - Pager - [phone]  After 6pm go to [email or web address] - password [email or web address] [facility]  Office  [phone]  CC: Primary care physician; Ezequiel Kayser, MDPatient ID: [name], male   DOB: 1935/02/18, 84 y.o.   MRN: [id number]

## 2018-10-14 NOTE — Plan of Care (Signed)
Patient is pleasant and cooperative with care. Patient asked why his leg still hurt, he was under the impression that surgery would fix the pain.  Problem: Education: Goal: Verbalization of understanding the information provided (i.e., activity precautions, restrictions, etc) will improve Outcome: Progressing Goal: Individualized Educational Video(s) Outcome: Progressing   Problem: Activity: Goal: Ability to ambulate and perform ADLs will improve Outcome: Progressing   Problem: Clinical Measurements: Goal: Postoperative complications will be avoided or minimized Outcome: Progressing   Problem: Self-Concept: Goal: Ability to maintain and perform role responsibilities to the fullest extent possible will improve Outcome: Progressing   Problem: Pain Management: Goal: Pain level will decrease Outcome: Progressing   Problem: Education: Goal: Knowledge of General Education information will improve Description Including pain rating scale, medication(s)/side effects and non-pharmacologic comfort measures Outcome: Progressing   Problem: Health Behavior/Discharge Planning: Goal: Ability to manage health-related needs will improve Outcome: Progressing   Problem: Clinical Measurements: Goal: Ability to maintain clinical measurements within normal limits will improve Outcome: Progressing Goal: Will remain free from infection Outcome: Progressing Goal: Diagnostic test results will improve Outcome: Progressing Goal: Respiratory complications will improve Outcome: Progressing Goal: Cardiovascular complication will be avoided Outcome: Progressing   Problem: Activity: Goal: Risk for activity intolerance will decrease Outcome: Progressing   Problem: Nutrition: Goal: Adequate nutrition will be maintained Outcome: Progressing   Problem: Coping: Goal: Level of anxiety will decrease Outcome: Progressing   Problem: Elimination: Goal: Will not experience complications related to bowel  motility Outcome: Progressing Goal: Will not experience complications related to urinary retention Outcome: Progressing   Problem: Pain Managment: Goal: General experience of comfort will improve Outcome: Progressing   Problem: Safety: Goal: Ability to remain free from injury will improve Outcome: Progressing   Problem: Skin Integrity: Goal: Risk for impaired skin integrity will decrease Outcome: Progressing

## 2018-10-14 NOTE — Evaluation (Signed)
Physical Therapy Evaluation Patient Details Name: [name] MRN: [id number] DOB: 09/21/1934 Today's Date: 10/14/2018   History of Present Illness  [name] is an 84yo male who comes to [facility] on 5/22 p mechanical fall sustaining a subcapitla Rt femur fracture, now s/p Rt hemi hip arthroplasty. PMH: Lt BKA ambulatory c prosthesis, AF on xarelto, CHF, PrCA, DM, PVD.   Clinical Impression  Pt admitted with above diagnosis. Pt currently with functional limitations due to the deficits listed below (see "PT Problem List"). Upon entry, pt in bed, awake, breakfast completed, RN in room giving meds, and agreeable to participate. RN reports pain meds given at [place] and no more scheduled until later. The pt is alert and oriented x3, pleasant, conversational, and generally a good historian, but has clear confusion and short term memory deficits with high level repetition in conversation in-session, some things repeated as many as 6x without any awareness. Pain is 8/10 at start and becomes much worse, pt putting forth good effort to participate, but clearly limited by pain. Exercises ultimately become P/ROM with substantial guarding that limits amplitude of movement. Pt requires +2 for all bed mobility, is never able to obtain independent balance at EOB d/t severe pain. Functional mobility assessment demonstrates increased effort/time requirements, poor tolerance, and need for physical assistance, whereas the patient performed these at a higher level of independence PTA. During rolling in bed for linen change, surgical dressing begins to peel back, leaving some stables visible- RN made aware. Noted air gap in dressing seal (~1cm anterior side) mediated by dry blood prior to bed mobility. Pt will benefit from skilled PT intervention to increase independence and safety with basic mobility in preparation for discharge to the venue listed below.       Follow Up Recommendations Supervision/Assistance - 24  hour;SNF    Equipment Recommendations  None recommended by PT    Recommendations for Other Services       Precautions / Restrictions Precautions Precautions: Fall Restrictions Weight Bearing Restrictions: Yes RLE Weight Bearing: Partial weight bearing      Mobility  Bed Mobility Overal bed mobility: Needs Assistance Bed Mobility: Rolling;Supine to Sit;Sit to Supine Rolling: Total assist   Supine to sit: Total assist;+2 for physical assistance Sit to supine: Total assist;+2 for physical assistance   General bed mobility comments: rigid from pain and has movement aprehension   Transfers Overall transfer level: (unable to attempt, pt very pain limited)                  Ambulation/Gait                Stairs            Wheelchair Mobility    Modified Rankin (Stroke Patients Only)       Balance Overall balance assessment: Needs assistance Sitting-balance support: (Left thigh supported) Sitting balance-Leahy Scale: Zero                                       Pertinent Vitals/Pain Pain Assessment: Faces Pain Score: 8 (at rest) Faces Pain Scale: Hurts worst(with linen change) Pain Descriptors / Indicators: Operative site guarding Pain Intervention(s): Limited activity within patient's tolerance;Monitored during session;Premedicated before session;Repositioned;Ice applied    Home Living Family/patient expects to be discharged to:: Private residence Living Arrangements: Spouse/significant other Available Help at Discharge: Family Type of Home: House Home Access: Ramped entrance  Home Layout: One level Home Equipment: Cane - single point;Walker - 2 wheels;Grab bars - toilet;Wheelchair - manual;Shower seat Additional Comments: At baseline, lives with wife in single-story home with ramp access.  Wife able to provide 24/7 supervision as needed    Prior Function Level of Independence: Independent with assistive device(s)          Comments: Able to walk with RW mostly household ambulation recently; AMB 350+ft c PT in Jan 2020 admission      Hand Dominance   Dominant Hand: Right    Extremity/Trunk Assessment   Upper Extremity Assessment Upper Extremity Assessment: Generalized weakness    Lower Extremity Assessment Lower Extremity Assessment: Generalized weakness       Communication   Communication: HOH  Cognition Arousal/Alertness: Awake/alert   Overall Cognitive Status: Impaired/Different from baseline Area of Impairment: Memory                     Memory: Decreased short-term memory         General Comments: "I thought after surgery my pain would be better, but I didn't expect it to be like this." Pt said this 6x in less than 3 minutes without any memory of having doing so. Also asked 4x if his response was typical after 'this procedure'.       General Comments      Exercises Total Joint Exercises Ankle Circles/Pumps: AROM;Right;15 reps Heel Slides: AAROM;PROM;Right;10 reps;Limitations(guarding and limited ability to prodcue movement; amplitude limited to less than 30 degrees) Heel Slides Limitations: guarding and limited ability to prodcue movement; amplitude limited to less than 30 degrees Hip ABduction/ADduction: Right;10 reps;AAROM;PROM;Limitations Hip Abduction/Adduction Limitations: guarding and limited ability to prodcue movement; amplitude limited to less than 30 degrees   Assessment/Plan    PT Assessment Patient needs continued PT services  PT Problem List Decreased range of motion;Decreased strength;Decreased activity tolerance;Decreased balance;Decreased mobility       PT Treatment Interventions DME instruction;Therapeutic exercise;Gait training;Stair training;Functional mobility training;Therapeutic activities;Patient/family education    PT Goals (Current goals can be found in the Care Plan section)  Acute Rehab PT Goals Patient Stated Goal: decrease his  pain, return to walking PT Goal Formulation: With patient Time For Goal Achievement: 10/28/18 Potential to Achieve Goals: Fair    Frequency BID   Barriers to discharge Inaccessible home environment would need a mechanical lift in home and +2 assist    Co-evaluation               AM-PAC PT "6 Clicks" Mobility  Outcome Measure Help needed turning from your back to your side while in a flat bed without using bedrails?: Total Help needed moving from lying on your back to sitting on the side of a flat bed without using bedrails?: Total Help needed moving to and from a bed to a chair (including a wheelchair)?: Total Help needed standing up from a chair using your arms (e.g., wheelchair or bedside chair)?: Total Help needed to walk in hospital room?: Total Help needed climbing 3-5 steps with a railing? : Total 6 Click Score: 6    End of Session   Activity Tolerance: Patient limited by pain;Treatment limited secondary to medical complications (Comment)(dressing coming off after mobility in bed. ) Patient left: in bed;with call bell/phone within reach;with bed alarm set;with nursing/sitter in room;with SCD's reapplied;Other (comment)(bucks traction ) Nurse Communication: Mobility status;Patient requests pain meds;Other (comment)(confusion; dressing) PT Visit Diagnosis: Other abnormalities of gait and mobility (R26.89);Difficulty in walking, not elsewhere classified (  R26.2)    Time: 6579-0383 PT Time Calculation (min) (ACUTE ONLY): 34 min   Charges:   PT Evaluation $PT Eval Moderate Complexity: 1 Mod PT Treatments $Therapeutic Exercise: 8-22 mins        9:59 AM, 10/14/18 Etta Grandchild, PT, DPT Physical Therapist - [facility]  [phone] ([place])   [place] C 10/14/2018, 9:53 AM

## 2018-10-15 ENCOUNTER — Encounter: Payer: Self-pay | Admitting: Specialist

## 2018-10-15 LAB — CBC
HCT: 36.4 % — ABNORMAL LOW (ref 39.0–52.0)
Hemoglobin: 11.8 g/dL — ABNORMAL LOW (ref 13.0–17.0)
MCH: 29.3 pg (ref 26.0–34.0)
MCHC: 32.4 g/dL (ref 30.0–36.0)
MCV: 90.3 fL (ref 80.0–100.0)
Platelets: 171 10*3/uL (ref 150–400)
RBC: 4.03 MIL/uL — ABNORMAL LOW (ref 4.22–5.81)
RDW: 15 % (ref 11.5–15.5)
WBC: 11.7 10*3/uL — ABNORMAL HIGH (ref 4.0–10.5)
nRBC: 0 % (ref 0.0–0.2)

## 2018-10-15 LAB — BASIC METABOLIC PANEL
Anion gap: 10 (ref 5–15)
BUN: 45 mg/dL — ABNORMAL HIGH (ref 8–23)
CO2: 23 mmol/L (ref 22–32)
Calcium: 8.3 mg/dL — ABNORMAL LOW (ref 8.9–10.3)
Chloride: 103 mmol/L (ref 98–111)
Creatinine, Ser: 2.44 mg/dL — ABNORMAL HIGH (ref 0.61–1.24)
GFR calc Af Amer: 28 mL/min — ABNORMAL LOW (ref >60)
GFR calc non Af Amer: 24 mL/min — ABNORMAL LOW (ref >60)
Glucose, Bld: 195 mg/dL — ABNORMAL HIGH (ref 70–99)
Potassium: 4.8 mmol/L (ref 3.5–5.1)
Sodium: 136 mmol/L (ref 135–145)

## 2018-10-15 LAB — GLUCOSE, CAPILLARY
Glucose-Capillary: 190 mg/dL — ABNORMAL HIGH (ref 70–99)
Glucose-Capillary: 187 mg/dL — ABNORMAL HIGH (ref 70–99)
Glucose-Capillary: 232 mg/dL — ABNORMAL HIGH (ref 70–99)
Glucose-Capillary: 175 mg/dL — ABNORMAL HIGH (ref 70–99)

## 2018-10-15 MED ORDER — GABAPENTIN 300 MG PO CAPS
300.0000 mg | ORAL_CAPSULE | Freq: Every day | ORAL | Status: DC
  Administered 2018-10-15 – 2018-10-17 (×3): 300 mg via ORAL
  Filled 2018-10-15 (×3): qty 1

## 2018-10-15 MED ORDER — SODIUM CHLORIDE 0.9 % IV SOLN
INTRAVENOUS | Status: AC
  Administered 2018-10-15: 10:00:00 via INTRAVENOUS

## 2018-10-15 MED ORDER — MORPHINE SULFATE (PF) 2 MG/ML IV SOLN
1.0000 mg | INTRAVENOUS | Status: DC | PRN
  Administered 2018-10-17 – 2018-10-18 (×2): 1 mg via INTRAVENOUS
  Filled 2018-10-15 (×2): qty 1

## 2018-10-15 NOTE — Progress Notes (Signed)
[facility] [place] NAME: [name]    MR#:  [id number]  DATE OF BIRTH:  May 10, 1935  SUBJECTIVE:   Patient postop day 2 patient appears quite restless and uncomfortable with not able to move right leg due to my extraction.c/o Hip pain. REVIEW OF SYSTEMS:   Review of Systems  Constitutional: Negative for chills, fever and weight loss.  HENT: Negative for ear discharge, ear pain and nosebleeds.   Eyes: Negative for blurred vision, pain and discharge.  Respiratory: Negative for sputum production, shortness of breath, wheezing and stridor.   Cardiovascular: Negative for chest pain, palpitations, orthopnea and PND.  Gastrointestinal: Negative for abdominal pain, diarrhea, nausea and vomiting.  Genitourinary: Negative for frequency and urgency.  Musculoskeletal: Positive for joint pain. Negative for back pain.  Neurological: Negative for sensory change, speech change, focal weakness and weakness.  Psychiatric/Behavioral: Negative for depression and hallucinations. The patient is not nervous/anxious.    Tolerating Diet:yes Tolerating PT: pending  DRUG ALLERGIES:   Allergies  Allergen Reactions  . Contrast Media [Iodinated Diagnostic Agents] Shortness Of Breath    SOB  . Iohexol Shortness Of Breath     Desc: Respiratory Distress, Laryngedema, diaphoresis, Onset Date: [id number]   . Metrizamide Shortness Of Breath    SOB SOB SOB   . Latex Rash    VITALS:  Blood pressure 124/60, pulse (!) 58, temperature 98.2 F (36.8 C), resp. rate 18, weight 79.4 kg, SpO2 99 %.  PHYSICAL EXAMINATION:   Physical Exam  GENERAL:  84 y.o.-year-old patient lying in the bed with no acute distress.  EYES: Pupils equal, round, reactive to light and accommodation. No scleral icterus. Extraocular muscles intact.  HEENT: Head atraumatic, normocephalic. Oropharynx and nasopharynx clear.  NECK:  Supple, no jugular venous distention. No thyroid  enlargement, no tenderness.  LUNGS: Normal breath sounds bilaterally, no wheezing, rales, rhonchi. No use of accessory muscles of respiration.  CARDIOVASCULAR: S1, S2 normal. No murmurs, rubs, or gallops.  ABDOMEN: Soft, nontender, nondistended. Bowel sounds present. No organomegaly or mass.  EXTREMITIES: No cyanosis, clubbing or edema b/l.   Right hip dressing present bucks traction+ NEUROLOGIC: Cranial nerves II through XII are intact. No focal Motor or sensory deficits b/l.   PSYCHIATRIC:  patient is alert and oriented x 3.  SKIN: No obvious rash, lesion, or ulcer.   LABORATORY PANEL:  CBC Recent Labs  Lab 10/15/18 0557  WBC 11.7*  HGB 11.8*  HCT 36.4*  PLT 171    Chemistries  Recent Labs  Lab 10/15/18 0557  NA 136  K 4.8  CL 103  CO2 23  GLUCOSE 195*  BUN 45*  CREATININE 2.44*  CALCIUM 8.3*   Cardiac Enzymes No results for input(s): TROPONINI in the last 168 hours. RADIOLOGY:  Dg Pelvis Portable  Result Date: 10/13/2018 CLINICAL DATA:  Pain EXAM: PORTABLE PELVIS 1-2 VIEWS COMPARISON:  10/13/2018 FINDINGS: There are expected postsurgical changes related to total hip arthroplasty on the right. There are overlying skin staples and subcutaneous gas. Extensive vascular calcifications are noted. The alignment appears near anatomic. There are degenerative changes of the left hip. IMPRESSION: Expected postsurgical changes status post total hip arthroplasty on the right. Electronically Signed   By: Constance Holster M.D.   On: 10/13/2018 19:36   Dg Hip Port Unilat With Pelvis 1v Right  Result Date: 10/13/2018 CLINICAL DATA:  Hip replacement.  Hip fracture. EXAM: DG HIP (WITH OR WITHOUT PELVIS) 1V PORT RIGHT COMPARISON:  10/13/2018 FINDINGS: A single cross-table view of the hip was obtained. The patient is now status post total hip arthroplasty. Evaluation is limited by lack of additional views. There are skin staples and subcutaneous soft tissue swelling and gas. There are few  linear densities at the level of the humeral head of unknown clinical significance. IMPRESSION: Limited view of the right hip status post total hip arthroplasty. Electronically Signed   By: Constance Holster M.D.   On: 10/13/2018 19:35   ASSESSMENT AND PLAN:  [name]  is a 84 y.o. male with a known history of atrial fibrillation, chronic is supposed to be on Xarelto how were not taking it since January, chronic congestive heart failure systolic, diabetes on insulin, chronic kidney disease stage III comes to the emergency room after he had a mechanical fall while he was in the parking lot his leg gave out and started hurting on his right hip.  1. Right hip fracture/femoral neck fracture status post mechanical fall -postop day 2 right hemi arthroplasty -Dr. Sabra Heck consult appreciated -patient has good functional capacity. He uses walker/cane. He has a left prosthetic leg. -Continue beta blockers perioperative -initiated PT. However patient unable to participate much  2. Chronic atrial fibrillation -continue metoprolol, diltiazem, amiodarone -patient is supposed to be on Xarelto. It was held in January after he had a intraventricular hemorrhage post TPA. Patient went for follow-up with Dr. Nehemiah Massed on 218 2020 and was asked to resume Xarelto however he has not been taking it. This was confirmed with patient's wife [name] also that he is not taking Xarelto -will need to resume Xarelto-- consider cardiology and agrees with plan -hemoglobin stable  3. Chronic systolic congestive heart failure -resume Lasix -patient not in heart failure -last echo in January showed EF of 45 to 50%  4. Type II diabetes on insulin -sliding scale while in house -will resume Lantus and NovoLog after surgery  5. Hypertension resume home meds  6. DVT prophylaxis SCD still surgery  7. Acute on chronic renal failure secondary to poor PO intake. IV fluids today. -Baseline creatinine 1.2 -creat  today  2.44    CODE STATUS: *full DVT Prophylaxis: Xarelto  TOTAL TIME TAKING CARE OF THIS PATIENT: **30* minutes.  >50% time spent on counselling and coordination of care  POSSIBLE D/C IN *2 to 3** DAYS, DEPENDING ON CLINICAL CONDITION.  Note: This dictation was prepared with Dragon dictation along with smaller phrase technology. Any transcriptional errors that result from this process are unintentional.  [name] Mandes M.D on 10/15/2018 at 11:39 AM  Between 7am to 6pm - Pager - [phone]  After 6pm go to [email or web address] - password [email or web address] [facility]  Office  [phone]  CC: Primary care physician; Ezequiel Kayser, MDPatient ID: [name], male   DOB: Jan 13, 1935, 84 y.o.   MRN: [id number]

## 2018-10-15 NOTE — Plan of Care (Signed)
  Problem: Education: Goal: Verbalization of understanding the information provided (i.e., activity precautions, restrictions, etc) will improve Outcome: Progressing Goal: Individualized Educational Video(s) Outcome: Progressing   Problem: Activity: Goal: Ability to ambulate and perform ADLs will improve Outcome: Progressing   Problem: Clinical Measurements: Goal: Postoperative complications will be avoided or minimized Outcome: Progressing   Problem: Self-Concept: Goal: Ability to maintain and perform role responsibilities to the fullest extent possible will improve Outcome: Progressing   Problem: Pain Management: Goal: Pain level will decrease Outcome: Progressing   Problem: Education: Goal: Knowledge of General Education information will improve Description: Including pain rating scale, medication(s)/side effects and non-pharmacologic comfort measures Outcome: Progressing   Problem: Health Behavior/Discharge Planning: Goal: Ability to manage health-related needs will improve Outcome: Progressing   Problem: Clinical Measurements: Goal: Ability to maintain clinical measurements within normal limits will improve Outcome: Progressing Goal: Will remain free from infection Outcome: Progressing Goal: Diagnostic test results will improve Outcome: Progressing Goal: Respiratory complications will improve Outcome: Progressing Goal: Cardiovascular complication will be avoided Outcome: Progressing   Problem: Activity: Goal: Risk for activity intolerance will decrease Outcome: Progressing   Problem: Nutrition: Goal: Adequate nutrition will be maintained Outcome: Progressing   Problem: Coping: Goal: Level of anxiety will decrease Outcome: Progressing   Problem: Elimination: Goal: Will not experience complications related to bowel motility Outcome: Progressing Goal: Will not experience complications related to urinary retention Outcome: Progressing   Problem: Pain  Managment: Goal: General experience of comfort will improve Outcome: Progressing   Problem: Safety: Goal: Ability to remain free from injury will improve Outcome: Progressing   Problem: Skin Integrity: Goal: Risk for impaired skin integrity will decrease Outcome: Progressing   

## 2018-10-15 NOTE — Progress Notes (Signed)
Subjective: 2 Days Post-Op Procedure(s) (LRB): ARTHROPLASTY BIPOLAR HIP (HEMIARTHROPLASTY) (Right)    Patient reports pain as moderate.  Sitting up in bed and eating lunch.  Appears comfortable.  Unwilling to move right hip or leg much on his own due to poor pain response.  Hip is stable and aligned well.  Encouraged him to move the leg more in bed and participate in PT.  Objective:   VITALS:   Vitals:   10/15/18 0517 10/15/18 0800  BP: (!) 117/53 124/60  Pulse: 64 (!) 58  Resp: 18   Temp: 98.3 F (36.8 C) 98.2 F (36.8 C)  SpO2: 93% 99%    Neurologically intact ABD soft Neurovascular intact Sensation intact distally Intact pulses distally Dorsiflexion/Plantar flexion intact Incision: no drainage  LABS Recent Labs    10/13/18 0836 10/14/18 0642 10/15/18 0557  HGB 15.0 12.7* 11.8*  HCT 43.6 38.1* 36.4*  WBC 13.6* 12.7* 11.7*  PLT 223 173 171    Recent Labs    10/13/18 0836 10/14/18 0642 10/15/18 0557  NA 137 139 136  K 4.2 4.5 4.8  BUN 38* 31* 45*  CREATININE 2.03* 1.62* 2.44*  GLUCOSE 242* 128* 195*    Recent Labs    10/13/18 0836  INR 1.2     Assessment/Plan: 2 Days Post-Op Procedure(s) (LRB): ARTHROPLASTY BIPOLAR HIP (HEMIARTHROPLASTY) (Right)   Advance diet Up with therapy D/C IV fluids Discharge to SNF

## 2018-10-15 NOTE — Progress Notes (Addendum)
Physical Therapy Treatment Patient Details Name: [name] MRN: [id number] DOB: Dec 17, 1934 Today's Date: 10/15/2018    History of Present Illness Travaris [name] is an 84yo male who comes to [facility] on 5/22 p mechanical fall sustaining a subcapitla Rt femur fracture, now s/p Rt hemi hip arthroplasty. PMH: Lt BKA ambulatory c prosthesis, AF on xarelto, CHF, PrCA, DM, PVD.     PT Comments    Assisted RN with repositioning in bed.  Pt pushing left in bed with heavy lean to the side.  Pt unable to participate in assisting with repositioning and continues to push away despite direction to assist.  MAX a/dependant to reposition.  Attempted supine ex for BLE but pt continues to resist motion and only minimal ROM is achieved especially on RLE.  Pt very rigid and resists despite small slow movements and encouragement.  (traction removed for activities).  Pt is unable to assist with any attempts at sitting or transition to chair and it was deferred at this time due pt pt and staff safety.  Discussed with RN and medical MD this am.   Follow Up Recommendations  Supervision/Assistance - 24 hour;SNF     Equipment Recommendations  None recommended by PT    Recommendations for Other Services       Precautions / Restrictions Precautions Precautions: Fall Restrictions Weight Bearing Restrictions: Yes RLE Weight Bearing: Partial weight bearing    Mobility  Bed Mobility Overal bed mobility: Needs Assistance             General bed mobility comments: rigid from pain and resists any movement or repositioning in bed.  Transfers                    Ambulation/Gait                 Stairs             Wheelchair Mobility    Modified Rankin (Stroke Patients Only)       Balance                                            Cognition Arousal/Alertness: Awake/alert Behavior During Therapy: Restless Overall Cognitive Status: Impaired/Different  from baseline                                        Exercises      General Comments        Pertinent Vitals/Pain Pain Assessment: Faces Faces Pain Scale: Hurts whole lot Pain Location: RLE with any movement, even upper body repositioning and LLE ex Pain Descriptors / Indicators: Grimacing;Guarding;Sore Pain Intervention(s): Limited activity within patient's tolerance;Monitored during session    Home Living                      Prior Function            PT Goals (current goals can now be found in the care plan section) Progress towards PT goals: Not progressing toward goals - comment    Frequency    BID      PT Plan Current plan remains appropriate    Co-evaluation              AM-PAC PT "6 Clicks" Mobility  Outcome Measure  Help needed turning from your back to your side while in a flat bed without using bedrails?: Total Help needed moving from lying on your back to sitting on the side of a flat bed without using bedrails?: Total Help needed moving to and from a bed to a chair (including a wheelchair)?: Total Help needed standing up from a chair using your arms (e.g., wheelchair or bedside chair)?: Total Help needed to walk in hospital room?: Total Help needed climbing 3-5 steps with a railing? : Total 6 Click Score: 6    End of Session   Activity Tolerance: No increased pain Patient left: in bed;with call bell/phone within reach;with bed alarm set Nurse Communication: Mobility status       Time: 0922-0939 PT Time Calculation (min) (ACUTE ONLY): 17 min  Charges:  $Therapeutic Exercise: 8-22 mins                     Chesley Noon, PTA 10/15/18, 10:30 AM

## 2018-10-15 NOTE — Progress Notes (Signed)
Paged prime doc [phone] for low BP see vitals

## 2018-10-15 NOTE — Anesthesia Postprocedure Evaluation (Signed)
Anesthesia Post Note  Patient: [name]  Procedure(s) Performed: ARTHROPLASTY BIPOLAR HIP (HEMIARTHROPLASTY) (Right )  Patient location during evaluation: PACU Anesthesia Type: General Level of consciousness: awake and alert and oriented Pain management: pain level controlled Vital Signs Assessment: post-procedure vital signs reviewed and stable Respiratory status: spontaneous breathing Cardiovascular status: blood pressure returned to baseline Anesthetic complications: no     Last Vitals:  Vitals:   10/15/18 0517 10/15/18 0800  BP: (!) 117/53 124/60  Pulse: 64 (!) 58  Resp: 18   Temp: 36.8 C 36.8 C  SpO2: 93% 99%    Last Pain:  Vitals:   10/14/18 2215  TempSrc:   PainSc: 0-No pain                 Nakari Bracknell

## 2018-10-16 LAB — CBC
HCT: 36.2 % — ABNORMAL LOW (ref 39.0–52.0)
Hemoglobin: 11.8 g/dL — ABNORMAL LOW (ref 13.0–17.0)
MCH: 29 pg (ref 26.0–34.0)
MCHC: 32.6 g/dL (ref 30.0–36.0)
MCV: 88.9 fL (ref 80.0–100.0)
Platelets: 185 10*3/uL (ref 150–400)
RBC: 4.07 MIL/uL — ABNORMAL LOW (ref 4.22–5.81)
RDW: 14.6 % (ref 11.5–15.5)
WBC: 12.1 10*3/uL — ABNORMAL HIGH (ref 4.0–10.5)
nRBC: 0 % (ref 0.0–0.2)

## 2018-10-16 LAB — BASIC METABOLIC PANEL
Anion gap: 10 (ref 5–15)
BUN: 56 mg/dL — ABNORMAL HIGH (ref 8–23)
CO2: 21 mmol/L — ABNORMAL LOW (ref 22–32)
Calcium: 8.2 mg/dL — ABNORMAL LOW (ref 8.9–10.3)
Chloride: 102 mmol/L (ref 98–111)
Creatinine, Ser: 2.72 mg/dL — ABNORMAL HIGH (ref 0.61–1.24)
GFR calc Af Amer: 24 mL/min — ABNORMAL LOW (ref >60)
GFR calc non Af Amer: 21 mL/min — ABNORMAL LOW (ref >60)
Glucose, Bld: 180 mg/dL — ABNORMAL HIGH (ref 70–99)
Potassium: 4.5 mmol/L (ref 3.5–5.1)
Sodium: 133 mmol/L — ABNORMAL LOW (ref 135–145)

## 2018-10-16 LAB — GLUCOSE, CAPILLARY
Glucose-Capillary: 183 mg/dL — ABNORMAL HIGH (ref 70–99)
Glucose-Capillary: 183 mg/dL — ABNORMAL HIGH (ref 70–99)
Glucose-Capillary: 132 mg/dL — ABNORMAL HIGH (ref 70–99)
Glucose-Capillary: 116 mg/dL — ABNORMAL HIGH (ref 70–99)

## 2018-10-16 MED ORDER — SODIUM CHLORIDE 0.9 % IV SOLN
INTRAVENOUS | Status: DC
  Administered 2018-10-16 – 2018-10-17 (×2): via INTRAVENOUS

## 2018-10-16 NOTE — Progress Notes (Signed)
Physical Therapy Treatment Patient Details Name: [name] MRN: [id number] DOB: September 11, 1934 Today's Date: 10/16/2018    History of Present Illness [name] is an 84yo male who comes to [facility] on 5/22 p mechanical fall sustaining a subcapitla Rt femur fracture, now s/p Rt hemi hip arthroplasty. PMH: Lt BKA ambulatory c prosthesis, AF on xarelto, CHF, PrCA, DM, PVD.     PT Comments    Pt is able to make some progress with therapy on this date. He continues to require maxA+2 for bed mobility but is able to sit at EOB today. He requires maxA+2 from an elevated bed to come partially to standing in 2 attempts with LLE prosthesis donned. He is limited with transfers due to R hip pain as well as general LE weakness. Pt urinates on the floor during attmept after pulling of his condom catheter. Unable to come fully upright and unsafe to attempt transfer to recliner at this time. He will need SNF placement at discharge. Pt will benefit from PT services to address deficits in strength, balance, and mobility in order to return to full function at home.     Follow Up Recommendations  SNF;Supervision/Assistance - 24 hour     Equipment Recommendations  None recommended by PT    Recommendations for Other Services       Precautions / Restrictions Precautions Precautions: Fall Restrictions Weight Bearing Restrictions: Yes RLE Weight Bearing: Partial weight bearing    Mobility  Bed Mobility Overal bed mobility: Needs Assistance Bed Mobility: Supine to Sit     Supine to sit: Max assist;+2 for physical assistance Sit to supine: Max assist;+2 for physical assistance   General bed mobility comments: Pt is limited secondary to pain but is able to get up to EOB with maxA+2 and heavy verbal cues. Once upright he requires continual minA+1 support to prevent him from falling backwards initally but evenutally is able to balance without support. When returning to bed he requires assist with BLEs  as well as sliding up toward [facility]  Transfers Overall transfer level: Needs assistance Equipment used: Rolling walker (2 wheeled) Transfers: Sit to/from Stand Sit to Stand: Max assist;+2 physical assistance         General transfer comment: Pt requires maxA+2 from an elevated bed to come partially to standing in 2 attempts. He is limited due to R hip pain as well as general LE weakness. Pt urinates on the floor during attmept after pulling of his condom catheter. Unable to come fully upright and unsafe to attempt transfer to recliner at this time.   Ambulation/Gait                 Stairs             Wheelchair Mobility    Modified Rankin (Stroke Patients Only)       Balance Overall balance assessment: Needs assistance Sitting-balance support: Bilateral upper extremity supported Sitting balance-Leahy Scale: Zero     Standing balance support: Bilateral upper extremity supported Standing balance-Leahy Scale: Zero                              Cognition Arousal/Alertness: Awake/alert Behavior During Therapy: Restless Overall Cognitive Status: Impaired/Different from baseline Area of Impairment: Memory                     Memory: Decreased short-term memory  Exercises      General Comments        Pertinent Vitals/Pain Pain Assessment: Faces Faces Pain Scale: Hurts [name] more Pain Location: RLE Pain Descriptors / Indicators: Grimacing;Guarding;Sore Pain Intervention(s): Monitored during session;Repositioned    Home Living                      Prior Function            PT Goals (current goals can now be found in the care plan section) Acute Rehab PT Goals Patient Stated Goal: decrease his pain, return to walking PT Goal Formulation: With patient Time For Goal Achievement: 10/28/18 Potential to Achieve Goals: Fair Progress towards PT goals: Progressing toward goals    Frequency    BID       PT Plan Current plan remains appropriate    Co-evaluation              AM-PAC PT "6 Clicks" Mobility   Outcome Measure  Help needed turning from your back to your side while in a flat bed without using bedrails?: A Lot Help needed moving from lying on your back to sitting on the side of a flat bed without using bedrails?: A Lot Help needed moving to and from a bed to a chair (including a wheelchair)?: Total Help needed standing up from a chair using your arms (e.g., wheelchair or bedside chair)?: Total Help needed to walk in hospital room?: Total Help needed climbing 3-5 steps with a railing? : Total 6 Click Score: 8    End of Session Equipment Utilized During Treatment: Gait belt Activity Tolerance: Patient tolerated treatment well Patient left: in bed;with call bell/phone within reach;with bed alarm set Nurse Communication: Mobility status PT Visit Diagnosis: Other abnormalities of gait and mobility (R26.89);Difficulty in walking, not elsewhere classified (R26.2)     Time: 6160-7371 PT Time Calculation (min) (ACUTE ONLY): 23 min  Charges:  $Therapeutic Activity: 23-37 mins                     [name] PT, DPT, GCS    [name] 10/16/2018, 12:28 PM

## 2018-10-16 NOTE — Progress Notes (Signed)
Subjective: 3 Days Post-Op Procedure(s) (LRB): ARTHROPLASTY BIPOLAR HIP (HEMIARTHROPLASTY) (Right)    Patient reports pain as mild.  Not much confusion today.  Still complains of pain although he looks comfortable.  Hip is stable and dressing dry.  Objective:   VITALS:   Vitals:   10/16/18 0422 10/16/18 0743  BP: (!) 145/60 (!) 119/106  Pulse: 82 76  Resp: 18 (!) 22  Temp:  98.9 F (37.2 C)  SpO2: 93% 93%    Neurologically intact ABD soft Neurovascular intact Sensation intact distally Intact pulses distally Dorsiflexion/Plantar flexion intact Incision: dressing C/D/I  LABS Recent Labs    10/14/18 0642 10/15/18 0557 10/16/18 0551  HGB 12.7* 11.8* 11.8*  HCT 38.1* 36.4* 36.2*  WBC 12.7* 11.7* 12.1*  PLT 173 171 185    Recent Labs    10/14/18 0642 10/15/18 0557 10/16/18 0551  NA 139 136 133*  K 4.5 4.8 4.5  BUN 31* 45* 56*  CREATININE 1.62* 2.44* 2.72*  GLUCOSE 128* 195* 180*    No results for input(s): LABPT, INR in the last 72 hours.   Assessment/Plan: 3 Days Post-Op Procedure(s) (LRB): ARTHROPLASTY BIPOLAR HIP (HEMIARTHROPLASTY) (Right)   Up with therapy Discharge to SNF   Enteric-coated aspirin 325 mg twice daily for 6 weeks  Return to clinic 2 weeks for exam and x-ray

## 2018-10-16 NOTE — Progress Notes (Signed)
[name] at [place] NAME: [name]    MR#:  [id number]  DATE OF BIRTH:  June 23, 1934  SUBJECTIVE:   Patient postop day 3   patient just finished working with physical therapy. He stood up at the edge of the bed. Complains of hip pain.  REVIEW OF SYSTEMS:   Review of Systems  Constitutional: Negative for chills, fever and weight loss.  HENT: Negative for ear discharge, ear pain and nosebleeds.   Eyes: Negative for blurred vision, pain and discharge.  Respiratory: Negative for sputum production, shortness of breath, wheezing and stridor.   Cardiovascular: Negative for chest pain, palpitations, orthopnea and PND.  Gastrointestinal: Negative for abdominal pain, diarrhea, nausea and vomiting.  Genitourinary: Negative for frequency and urgency.  Musculoskeletal: Positive for joint pain. Negative for back pain.  Neurological: Negative for sensory change, speech change, focal weakness and weakness.  Psychiatric/Behavioral: Negative for depression and hallucinations. The patient is not nervous/anxious.    Tolerating Diet:yes Tolerating PT:STR  DRUG ALLERGIES:   Allergies  Allergen Reactions  . Contrast Media [Iodinated Diagnostic Agents] Shortness Of Breath    SOB  . Iohexol Shortness Of Breath     Desc: Respiratory Distress, Laryngedema, diaphoresis, Onset Date: [id number]   . Metrizamide Shortness Of Breath    SOB SOB SOB   . Latex Rash    VITALS:  Blood pressure (!) 119/106, pulse 76, temperature 98.9 F (37.2 C), temperature source Oral, resp. rate (!) 22, weight 79.4 kg, SpO2 93 %.  PHYSICAL EXAMINATION:   Physical Exam  GENERAL:  84 y.o.-year-old patient lying in the bed with no acute distress.  EYES: Pupils equal, round, reactive to light and accommodation. No scleral icterus. Extraocular muscles intact.  HEENT: Head atraumatic, normocephalic. Oropharynx and nasopharynx clear.  NECK:  Supple, no jugular venous  distention. No thyroid enlargement, no tenderness.  LUNGS: Normal breath sounds bilaterally, no wheezing, rales, rhonchi. No use of accessory muscles of respiration.  CARDIOVASCULAR: S1, S2 normal. No murmurs, rubs, or gallops.  ABDOMEN: Soft, nontender, nondistended. Bowel sounds present. No organomegaly or mass.  EXTREMITIES: No cyanosis, clubbing or edema b/l.   Right hip dressing present bucks traction+ NEUROLOGIC: Cranial nerves II through XII are intact. No focal Motor or sensory deficits b/l.   PSYCHIATRIC:  patient is alert and oriented x 3.  SKIN: No obvious rash, lesion, or ulcer.   LABORATORY PANEL:  CBC Recent Labs  Lab 10/16/18 0551  WBC 12.1*  HGB 11.8*  HCT 36.2*  PLT 185    Chemistries  Recent Labs  Lab 10/16/18 0551  NA 133*  K 4.5  CL 102  CO2 21*  GLUCOSE 180*  BUN 56*  CREATININE 2.72*  CALCIUM 8.2*   Cardiac Enzymes No results for input(s): TROPONINI in the last 168 hours. RADIOLOGY:  No results found. ASSESSMENT AND PLAN:  [name]  is a 84 y.o. male with a known history of atrial fibrillation, chronic is supposed to be on Xarelto how were not taking it since January, chronic congestive heart failure systolic, diabetes on insulin, chronic kidney disease stage III comes to the emergency room after he had a mechanical fall while he was in the parking lot his leg gave out and started hurting on his right hip.  1. Right hip fracture/femoral neck fracture status post mechanical fall -postop day 3 right hemi arthroplasty -Dr. Sabra Heck consult appreciated - He uses walker/cane. He has a left prosthetic leg. -Continue beta blockers  perioperative -initiated PT.  2. Chronic atrial fibrillation -continue metoprolol, diltiazem, amiodarone -patient is supposed to be on Xarelto. It was held in January after he had a intraventricular hemorrhage post TPA. Patient went for follow-up with Dr. Nehemiah Massed on 218 2020 and was asked to resume Xarelto however he  has not been taking it. This was confirmed with patient's wife [name] also that he is not taking Xarelto -placed back on Xarelto-- curbsided cardiology and agrees with plan -hemoglobin stable  3. Chronic systolic congestive heart failure -resume Lasix -patient not in heart failure -last echo in January showed EF of 45 to 50%  4. Type II diabetes on insulin -sliding scale while in house -will resume Lantus and NovoLog after surgery  5. Hypertension resume home meds  6. DVT prophylaxis SCD still surgery  7. Acute on chronic renal failure secondary to poor PO intake.  -started IV fluids  -Baseline creatinine 1.44 -creat   2.44--2.7. Continue IV fluids. Patient advised oral fluid intake.   CODE STATUS: *full DVT Prophylaxis: Xarelto  TOTAL TIME TAKING CARE OF THIS PATIENT: **30* minutes.  >50% time spent on counselling and coordination of care  POSSIBLE D/C IN 1-2 DAYS, DEPENDING ON CLINICAL CONDITION.  Note: This dictation was prepared with Dragon dictation along with smaller phrase technology. Any transcriptional errors that result from this process are unintentional.  [name] Mandes M.D on 10/16/2018 at 12:02 PM  Between 7am to 6pm - Pager - [phone]  After 6pm go to [email or web address] - password [email or web address] [facility]  Office  [phone]  CC: Primary care physician; Ezequiel Kayser, MDPatient ID: [name], male   DOB: Sep 29, 1934, 84 y.o.   MRN: [id number]

## 2018-10-16 NOTE — TOC Progression Note (Signed)
Transition of Care [facility]) - Progression Note    Patient Details  Name: [name] MRN: [id number] Date of Birth: [birth year]  Transition of Care [facility]) CM/SW [place], RN Phone Number: 10/16/2018, 11:43 AM  Clinical Narrative:    Spoke to Risk at [facility], they can take the patient tomorrow, I notified the Physician, she stated that the patient has an elevated Cr and will not DC today but most likely tomorrow, I called the Wife [name] and let her know [facility] has accepted the patient and they will be able to take the patient tomorrow if he is cleared for DC, she agrees.   Expected Discharge Plan: (Home with HH vs SNF) Barriers to Discharge: No Barriers Identified  Expected Discharge Plan and Services Expected Discharge Plan: (Home with [facility] vs SNF) In-house Referral: Clinical Social Work Discharge Planning Services: CM Consult Post Acute Care Choice: NA(Patient and wife hopes to discharge home with home health) Living arrangements for the past 2 months: Single Family Home                 DME Arranged: N/A DME Agency: NA       HH Arranged: NA HH Agency: NA         Social Determinants of Health (SDOH) Interventions    Readmission Risk Interventions No flowsheet data found.

## 2018-10-16 NOTE — Care Management Important Message (Signed)
Important Message  Patient Details  Name: [name] MRN: [id number] Date of Birth: 12/07/1934   Medicare Important Message Given:  Yes    Dannette Barbara 10/16/2018, 10:28 AM

## 2018-10-16 NOTE — TOC Progression Note (Signed)
Transition of Care [facility]) - Progression Note    Patient Details  Name: KINGDAVID LEINBACH MRN: [id number] Date of Birth: [birth year]  [facility]) CM/SW [place], RN Phone Number: 10/16/2018, 11:28 AM  Clinical Narrative:     Patient requested that I talked to the Wife [name] about the bed offers, I called [name] and we discussed Bed offers in detail, She stated that [facility] is closer to her and that she would like for the patient to go to that facility.  I called [facility] and spoke to Snyder, I sent the acceptance thru the Hub, Liliane Channel said he would have to call me back to let me know if they can take the patient today.  Expected Discharge Plan: (Home with HH vs SNF) Barriers to Discharge: No Barriers Identified  Expected Discharge Plan and Services Expected Discharge Plan: (Home with [facility] vs SNF) In-house Referral: Clinical Social Work Discharge Planning Services: CM Consult Post Acute Care Choice: NA(Patient and wife hopes to discharge home with home health) Living arrangements for the past 2 months: Single Family Home                 DME Arranged: N/A DME Agency: NA       HH Arranged: NA HH Agency: NA         Social Determinants of Health (SDOH) Interventions    Readmission Risk Interventions No flowsheet data found.

## 2018-10-16 NOTE — Progress Notes (Signed)
[place] for Xarelto Indication: atrial fibrillation  Allergies  Allergen Reactions  . Contrast Media [Iodinated Diagnostic Agents] Shortness Of Breath    SOB  . Iohexol Shortness Of Breath     Desc: Respiratory Distress, Laryngedema, diaphoresis, Onset Date: [id number]   . Metrizamide Shortness Of Breath    SOB SOB SOB   . Latex Rash    Patient Measurements: Weight: 175 lb 0.7 oz (79.4 kg)  Vital Signs: Temp: 98.9 F (37.2 C) (05/25 0743) Temp Source: Oral (05/25 0743) BP: 119/106 (05/25 0743) Pulse Rate: 76 (05/25 0743)  Labs: Recent Labs    10/14/18 0642 10/15/18 0557 10/16/18 0551  HGB 12.7* 11.8* 11.8*  HCT 38.1* 36.4* 36.2*  PLT 173 171 185  CREATININE 1.62* 2.44* 2.72*    Estimated Creatinine Clearance: 21.5 mL/min (A) (by C-G formula based on SCr of 2.72 mg/dL (H)).   Assessment: 83 yo male with hx of AFib on Xarelto in the past. Per hospitalist, cardiology (here and outpt note) recommending restarting anticoagulation and ortho ok with restarting anticoagulation. Benefit outweighs risk per MD. Pt noted to be on Xarelto 20 mg daily with dinner in the past.   TBW CrCl 40.1 ml/min   Goal of Therapy:  Monitor platelets by anticoagulation protocol: Yes   Plan:  Scr increased to 2.72  TBW CRCL= 23.9 ml/min For Afib, for Crcl 15-50 ml/min will continue Xarelto15 mg daily with food.   CBC and SCr at least every three days per policy  Pharmacy will continue to follow.   Izzabell Klasen A 10/16/2018,9:49 AM

## 2018-10-17 LAB — GLUCOSE, CAPILLARY
Glucose-Capillary: 141 mg/dL — ABNORMAL HIGH (ref 70–99)
Glucose-Capillary: 161 mg/dL — ABNORMAL HIGH (ref 70–99)
Glucose-Capillary: 278 mg/dL — ABNORMAL HIGH (ref 70–99)
Glucose-Capillary: 260 mg/dL — ABNORMAL HIGH (ref 70–99)

## 2018-10-17 LAB — BASIC METABOLIC PANEL
Anion gap: 8 (ref 5–15)
BUN: 57 mg/dL — ABNORMAL HIGH (ref 8–23)
CO2: 19 mmol/L — ABNORMAL LOW (ref 22–32)
Calcium: 8 mg/dL — ABNORMAL LOW (ref 8.9–10.3)
Chloride: 109 mmol/L (ref 98–111)
Creatinine, Ser: 2.4 mg/dL — ABNORMAL HIGH (ref 0.61–1.24)
GFR calc Af Amer: 28 mL/min — ABNORMAL LOW (ref >60)
GFR calc non Af Amer: 24 mL/min — ABNORMAL LOW (ref >60)
Glucose, Bld: 150 mg/dL — ABNORMAL HIGH (ref 70–99)
Potassium: 4.6 mmol/L (ref 3.5–5.1)
Sodium: 136 mmol/L (ref 135–145)

## 2018-10-17 LAB — CREATININE, SERUM
Creatinine, Ser: 2.31 mg/dL — ABNORMAL HIGH (ref 0.61–1.24)
GFR calc Af Amer: 30 mL/min — ABNORMAL LOW (ref >60)
GFR calc non Af Amer: 26 mL/min — ABNORMAL LOW (ref >60)

## 2018-10-17 NOTE — Progress Notes (Signed)
Physical Therapy Treatment Patient Details Name: [name] MRN: [id number] DOB: 1935-01-22 Today's Date: 10/17/2018    History of Present Illness [name] is an 84yo male who comes to [facility] on 5/22 p mechanical fall sustaining a subcapitla Rt femur fracture, now s/p Rt hemi hip arthroplasty. PMH: Lt BKA ambulatory c prosthesis, AF on xarelto, CHF, PrCA, DM, PVD.     PT Comments    Pt was very willing to participate with PT despite his continued c/o pain.  He had some labored breathing with exercises but consistently had O2 in the mid/low 80s t/o the session despite cues for deep/focused breathing and rest breaks between sets.  Pt with some shortness of breath, nursing notified and O2 initiated/donned near end of session.   Mobility deferred this afternoon, but pt very much struggled with standing attempts this AM.  He continues to have considerable pain (though less limiting with exercises this afternoon).    Follow Up Recommendations  SNF;Supervision/Assistance - 24 hour     Equipment Recommendations  None recommended by PT    Recommendations for Other Services       Precautions / Restrictions Precautions Precautions: Fall;Posterior Hip Precaution Booklet Issued: Yes (comment) Restrictions RLE Weight Bearing: Partial weight bearing    Mobility  Bed Mobility               General bed mobility comments: deferred mobility this afternoon as he was having a lot of pain and with multiple vitals testing was consistently below 85% on room air despite cues for breathing and rest breaks between sets of exercises  Transfers                    Ambulation/Gait                 Stairs             Wheelchair Mobility    Modified Rankin (Stroke Patients Only)       Balance                                            Cognition Arousal/Alertness: Awake/alert Behavior During Therapy: Anxious Overall Cognitive Status:  Difficult to assess Area of Impairment: Memory                     Memory: Decreased short-term memory                Exercises Total Joint Exercises Ankle Circles/Pumps: Strengthening;15 reps;Right Quad Sets: Strengthening;15 reps;Both Short Arc Quad: AROM;Strengthening;15 reps;Both Heel Slides: AAROM;10 reps;Right Hip ABduction/ADduction: AROM;AAROM;15 reps;Both Hip Abduction/Adduction Limitations: less c/o pain during AAROM AB/AD this session Straight Leg Raises: Left;Strengthening;15 reps    General Comments        Pertinent Vitals/Pain Pain Score: 6  Pain Location: RLE    Home Living                      Prior Function            PT Goals (current goals can now be found in the care plan section) Progress towards PT goals: Not progressing toward goals - comment(deferred mobility secondary to fatigue, O2 sats, pain)    Frequency    BID      PT Plan Current plan remains appropriate    Co-evaluation  AM-PAC PT "6 Clicks" Mobility   Outcome Measure  Help needed turning from your back to your side while in a flat bed without using bedrails?: A Lot Help needed moving from lying on your back to sitting on the side of a flat bed without using bedrails?: A Lot Help needed moving to and from a bed to a chair (including a wheelchair)?: Total Help needed standing up from a chair using your arms (e.g., wheelchair or bedside chair)?: Total Help needed to walk in hospital room?: Total Help needed climbing 3-5 steps with a railing? : Total 6 Click Score: 8    End of Session Equipment Utilized During Treatment: (nursing to don O2 after sats report) Activity Tolerance: Patient limited by fatigue Patient left: with call bell/phone within reach;with nursing/sitter in room;in bed Nurse Communication: (low O2 sats t/o session) PT Visit Diagnosis: Other abnormalities of gait and mobility (R26.89);Difficulty in walking, not elsewhere  classified (R26.2)     Time: 9628-3662 PT Time Calculation (min) (ACUTE ONLY): 24 min  Charges:  $Therapeutic Exercise: 23-37 mins                     Kreg Shropshire, DPT 10/17/2018, 4:12 PM

## 2018-10-17 NOTE — Progress Notes (Addendum)
[place] for Xarelto Indication: atrial fibrillation  Allergies  Allergen Reactions  . Contrast Media [Iodinated Diagnostic Agents] Shortness Of Breath    SOB  . Iohexol Shortness Of Breath     Desc: Respiratory Distress, Laryngedema, diaphoresis, Onset Date: [id number]   . Metrizamide Shortness Of Breath    SOB SOB SOB   . Latex Rash    Patient Measurements: Weight: 175 lb 0.7 oz (79.4 kg)  Vital Signs: Temp: 100.2 F (37.9 C) (05/26 0343) Temp Source: Oral (05/26 0343) BP: 165/77 (05/26 0343) Pulse Rate: 65 (05/26 0343)  Labs: Recent Labs    10/15/18 0557 10/16/18 0551 10/17/18 0554  HGB 11.8* 11.8*  --   HCT 36.4* 36.2*  --   PLT 171 185  --   CREATININE 2.44* 2.72* 2.40*    Estimated Creatinine Clearance: 24.4 mL/min (A) (by C-G formula based on SCr of 2.4 mg/dL (H)).   Assessment: 84 yo male with hx of AFib on Xarelto in the past. Per hospitalist, cardiology (here and outpt note) recommending restarting anticoagulation and ortho ok with restarting anticoagulation. Benefit outweighs risk per MD. Pt noted to be on Xarelto 20 mg daily with dinner in the past.   TBW CrCl 40.1 ml/min   Goal of Therapy:  Monitor platelets by anticoagulation protocol: Yes   Plan:  Scr decreased to 2.4  TBW CRCL= 24.4 ml/min For Afib, for Crcl 15-50 ml/min will continue Xarelto15 mg daily with food.   CBC and SCr at least every three days per policy  Pharmacy will continue to follow.   Lauralei Clouse A Jamonica Schoff 10/17/2018,7:15 AM

## 2018-10-17 NOTE — Progress Notes (Signed)
[facility] [place] NAME: Lyriq Jarchow    MR#:  [id number]  DATE OF BIRTH:  07/20/1934  SUBJECTIVE:   Patient postop day 4   Tolerated some physical therapy yday. Complains of hip pain.  REVIEW OF SYSTEMS:   Review of Systems  Constitutional: Negative for chills, fever and weight loss.  HENT: Negative for ear discharge, ear pain and nosebleeds.   Eyes: Negative for blurred vision, pain and discharge.  Respiratory: Negative for sputum production, shortness of breath, wheezing and stridor.   Cardiovascular: Negative for chest pain, palpitations, orthopnea and PND.  Gastrointestinal: Negative for abdominal pain, diarrhea, nausea and vomiting.  Genitourinary: Negative for frequency and urgency.  Musculoskeletal: Positive for joint pain. Negative for back pain.  Neurological: Negative for sensory change, speech change, focal weakness and weakness.  Psychiatric/Behavioral: Negative for depression and hallucinations. The patient is not nervous/anxious.    Tolerating Diet:yes Tolerating PT:STR  DRUG ALLERGIES:   Allergies  Allergen Reactions  . Contrast Media [Iodinated Diagnostic Agents] Shortness Of Breath    SOB  . Iohexol Shortness Of Breath     Desc: Respiratory Distress, Laryngedema, diaphoresis, Onset Date: [id number]   . Metrizamide Shortness Of Breath    SOB SOB SOB   . Latex Rash    VITALS:  Blood pressure (!) 165/77, pulse 65, temperature 100.2 F (37.9 C), temperature source Oral, resp. rate 20, weight 79.4 kg, SpO2 95 %.  PHYSICAL EXAMINATION:   Physical Exam  GENERAL:  84 y.o.-year-old patient lying in the bed with no acute distress.  EYES: Pupils equal, round, reactive to light and accommodation. No scleral icterus. Extraocular muscles intact.  HEENT: Head atraumatic, normocephalic. Oropharynx and nasopharynx clear.  NECK:  Supple, no jugular venous distention. No thyroid enlargement, no tenderness.   LUNGS: Normal breath sounds bilaterally, no wheezing, rales, rhonchi. No use of accessory muscles of respiration.  CARDIOVASCULAR: S1, S2 normal. No murmurs, rubs, or gallops.  ABDOMEN: Soft, nontender, nondistended. Bowel sounds present. No organomegaly or mass.  EXTREMITIES: No cyanosis, clubbing or edema b/l.   Right hip dressing present, bucks traction+ NEUROLOGIC: Cranial nerves II through XII are intact. No focal Motor or sensory deficits b/l.   PSYCHIATRIC:  patient is alert and oriented x 2.  SKIN: No obvious rash, lesion, or ulcer.   LABORATORY PANEL:  CBC Recent Labs  Lab 10/16/18 0551  WBC 12.1*  HGB 11.8*  HCT 36.2*  PLT 185    Chemistries  Recent Labs  Lab 10/17/18 0554  NA 136  K 4.6  CL 109  CO2 19*  GLUCOSE 150*  BUN 57*  CREATININE 2.40*  CALCIUM 8.0*   Cardiac Enzymes No results for input(s): TROPONINI in the last 168 hours. RADIOLOGY:  No results found. ASSESSMENT AND PLAN:  Owenn Rothermel  is a 84 y.o. male with a known history of atrial fibrillation, chronic is supposed to be on Xarelto how were not taking it since January, chronic congestive heart failure systolic, diabetes on insulin, chronic kidney disease stage III comes to the emergency room after he had a mechanical fall while he was in the parking lot his leg gave out and started hurting on his right hip.  1. Right hip fracture/femoral neck fracture status post mechanical fall -postop day 4  right hemi arthroplasty -Dr. Sabra Heck consult appreciated - He uses walker/cane. He has a left prosthetic leg. -Continue beta blockers perioperative -initiated PT.  2. Chronic atrial fibrillation -continue metoprolol, diltiazem,  amiodarone -placed back on Xarelto-- curbsided cardiology and agrees with plan -hemoglobin stable  3. Chronic systolic congestive heart failure -hold lasix -patient not in heart failure -last echo in January showed EF of 45 to 50%  4. Type II diabetes on insulin -cont  Lantus and NovoLog   5. Hypertension resume home meds  6. DVT prophylaxis Xarelto (renal dose)  7. Acute on chronic renal failure secondary to poor PO intake.  -started IV fluids  -Baseline creatinine 1.44 -creat   2.44--2.7--2.40. - Continue IV fluids. Patient advised oral fluid intake. -check creat at noon..If these abnormal clinical findings persist, appropriate workup will be completed. The patient understands that follow up is required to elucidate the situation. Continues to improve then d/c to rehab today.   CODE STATUS: *full  DVT Prophylaxis: Xarelto  TOTAL TIME TAKING CARE OF THIS PATIENT: *30* minutes.  >50% time spent on counselling and coordination of care  POSSIBLE D/C IN 1-2 DAYS, DEPENDING ON CLINICAL CONDITION.  Note: This dictation was prepared with Dragon dictation along with smaller phrase technology. Any transcriptional errors that result from this process are unintentional.  [name] Mandes M.D on 10/17/2018 at 7:13 AM  Between 7am to 6pm - Pager - [phone]  After 6pm go to [email or web address] - password [email or web address] [facility]  Office  [phone]  CC: Primary care physician; Ezequiel Kayser, MDPatient ID: [name], male   DOB: 18-Jan-1935, 84 y.o.   MRN: [id number]

## 2018-10-17 NOTE — Progress Notes (Signed)
Physical Therapy Treatment Patient Details Name: [name] MRN: [id number] DOB: 05-26-1934 Today's Date: 10/17/2018    History of Present Illness [name] is an 84yo male who comes to [facility] on 5/22 p mechanical fall sustaining a subcapitla Rt femur fracture, now s/p Rt hemi hip arthroplasty. PMH: Lt BKA ambulatory c prosthesis, AF on xarelto, CHF, PrCA, DM, PVD.     PT Comments    Pt anxious t/o much of the PT session, but eager and willing to participate despite pain and some mild confusion.  He was able to help minimally with donning prosthetic but was very limited with what he was actually able to do with both bed mobility as well as very difficult time with attempts at standing.  Pt unable to get upright/straighten knees, etc with very heavy +2 assist and excessive cuing.  Pt very functionally limited but willing to participate t/o the session.   Follow Up Recommendations  SNF;Supervision/Assistance - 24 hour     Equipment Recommendations  None recommended by PT    Recommendations for Other Services       Precautions / Restrictions Precautions Precautions: Fall;Posterior Hip Restrictions Weight Bearing Restrictions: Yes RLE Weight Bearing: Partial weight bearing    Mobility  Bed Mobility Overal bed mobility: Needs Assistance Bed Mobility: Supine to Sit Rolling: Max assist   Supine to sit: Max assist Sit to supine: (Pt able to assist with getting to EOB but still requires ver)      Transfers Overall transfer level: Needs assistance Equipment used: Rolling walker (2 wheeled) Transfers: Sit to/from Stand Sit to Stand: Total assist;+2 physical assistance         General transfer comment: Pt unable to attain standing despite heavy assist and elevated bed height.  Pt could not get weight forward onto feet or UEs on walker.  Heavy, heavy +2 assist with good effort from patient (L prostetic donned) w/o ability to attain fully standing.   Ambulation/Gait              General Gait Details: completely unable to even attempt   Stairs             Wheelchair Mobility    Modified Rankin (Stroke Patients Only)       Balance Overall balance assessment: Needs assistance Sitting-balance support: Bilateral upper extremity supported Sitting balance-Leahy Scale: Poor     Standing balance support: Bilateral upper extremity supported Standing balance-Leahy Scale: Zero Standing balance comment: inability to even attain fully upright                            Cognition Arousal/Alertness: Awake/alert Behavior During Therapy: Anxious Overall Cognitive Status: Difficult to assess                                 General Comments: Pt again focused on the pain from surgery, lacking full awareness of situation      Exercises Total Joint Exercises Ankle Circles/Pumps: AROM;15 reps Quad Sets: Strengthening;15 reps Short Arc Quad: AROM;AAROM;10 reps Heel Slides: AAROM;10 reps Hip ABduction/ADduction: AAROM;15 reps    General Comments        Pertinent Vitals/Pain Pain Assessment: 0-10 Pain Score: 7  Pain Location: RLE Pain Descriptors / Indicators: Grimacing;Guarding;Sore    Home Living  Prior Function            PT Goals (current goals can now be found in the care plan section) Progress towards PT goals: Progressing toward goals    Frequency    BID      PT Plan Current plan remains appropriate    Co-evaluation              AM-PAC PT "6 Clicks" Mobility   Outcome Measure  Help needed turning from your back to your side while in a flat bed without using bedrails?: A Lot Help needed moving from lying on your back to sitting on the side of a flat bed without using bedrails?: A Lot Help needed moving to and from a bed to a chair (including a wheelchair)?: Total Help needed standing up from a chair using your arms (e.g., wheelchair or bedside chair)?:  Total Help needed to walk in hospital room?: Total Help needed climbing 3-5 steps with a railing? : Total 6 Click Score: 8    End of Session Equipment Utilized During Treatment: Gait belt Activity Tolerance: Patient limited by pain;Patient limited by fatigue Patient left: with call bell/phone within reach;with nursing/sitter in room;in bed Nurse Communication: Mobility status PT Visit Diagnosis: Other abnormalities of gait and mobility (R26.89);Difficulty in walking, not elsewhere classified (R26.2)     Time: 1552-0802 PT Time Calculation (min) (ACUTE ONLY): 39 min  Charges:  $Therapeutic Exercise: 23-37 mins $Therapeutic Activity: 8-22 mins                     Kreg Shropshire, DPT 10/17/2018, 11:09 AM

## 2018-10-18 LAB — BASIC METABOLIC PANEL
Anion gap: 7 (ref 5–15)
BUN: 55 mg/dL — ABNORMAL HIGH (ref 8–23)
CO2: 21 mmol/L — ABNORMAL LOW (ref 22–32)
Calcium: 8 mg/dL — ABNORMAL LOW (ref 8.9–10.3)
Chloride: 109 mmol/L (ref 98–111)
Creatinine, Ser: 2.36 mg/dL — ABNORMAL HIGH (ref 0.61–1.24)
GFR calc Af Amer: 29 mL/min — ABNORMAL LOW (ref >60)
GFR calc non Af Amer: 25 mL/min — ABNORMAL LOW (ref >60)
Glucose, Bld: 238 mg/dL — ABNORMAL HIGH (ref 70–99)
Potassium: 4.6 mmol/L (ref 3.5–5.1)
Sodium: 137 mmol/L (ref 135–145)

## 2018-10-18 LAB — SURGICAL PATHOLOGY

## 2018-10-18 LAB — GLUCOSE, CAPILLARY: Glucose-Capillary: 233 mg/dL — ABNORMAL HIGH (ref 70–99)

## 2018-10-18 MED ORDER — METHOCARBAMOL 500 MG PO TABS: 500.0000 mg | ORAL_TABLET | Freq: Four times a day (QID) | ORAL | 0 refills | Status: DC | PRN

## 2018-10-18 MED ORDER — HYDROCODONE-ACETAMINOPHEN 5-325 MG PO TABS: 1.0000 | ORAL_TABLET | Freq: Four times a day (QID) | ORAL | 0 refills | Status: DC | PRN

## 2018-10-18 MED ORDER — RIVAROXABAN 15 MG PO TABS: 15.0000 mg | ORAL_TABLET | Freq: Every day | ORAL | 1 refills | Status: DC

## 2018-10-18 MED ORDER — INSULIN GLARGINE 100 UNIT/ML ~~LOC~~ SOLN: 15.0000 [IU] | Freq: Every day | SUBCUTANEOUS | 0 refills | Status: DC

## 2018-10-18 MED ORDER — FERROUS SULFATE 325 (65 FE) MG PO TABS: 325.0000 mg | ORAL_TABLET | Freq: Every day | ORAL | 3 refills | Status: DC

## 2018-10-18 NOTE — TOC Transition Note (Signed)
Transition of Care [facility]) - CM/SW Discharge Note   Patient Details  Name: [name] MRN: [id number] Date of Birth: 24-Nov-1934  Transition of Care [facility]) CM/SW Contact:  Su Hilt, RN Phone Number: 10/18/2018, 9:15 AM   Clinical Narrative:    Patient to Discharge today to [facility], His Wife [name] was made aware, patient going to room E13, bedside nurse to call report to [phone], will transport via EMS non urgent    Final next level of care: Skilled Nursing Facility Barriers to Discharge: Barriers Resolved   Patient Goals and CMS Choice Patient states their goals for this hospitalization and ongoing recovery are:: Get hip fixed   Choice offered to / list presented to : NA(NA at present)  Discharge Placement                  Name of family member notified: [name] Patient and family notified of of transfer: 10/18/18  Discharge Plan and Services In-house Referral: Clinical Social Work Discharge Planning Services: CM Consult Post Acute Care Choice: NA(Patient and wife hopes to discharge home with home health)          DME Arranged: N/A DME Agency: NA       HH Arranged: NA HH Agency: NA        Social Determinants of Health (SDOH) Interventions     Readmission Risk Interventions No flowsheet data found.

## 2018-10-18 NOTE — Progress Notes (Signed)
Report called and given to [facility] at [place]. IV removed. EMS called for transport. Will assist pt in getting dressed and await transport.

## 2018-10-18 NOTE — Progress Notes (Signed)
Physical Therapy Treatment Patient Details Name: [name] MRN: [id number] DOB: 07-09-1934 Today's Date: 10/18/2018    History of Present Illness [name] is an 84yo male who comes to [facility] on 5/[facility] p mechanical fall sustaining a subcapitla Rt femur fracture, now s/p Rt hemi hip arthroplasty. PMH: Lt BKA ambulatory c prosthesis, AF on xarelto, CHF, PrCA, DM, PVD.     PT Comments    Pt in bed upon entry, breakfast finished, agreeable to participate. Pt still unable to actively move RLE in bed which is concerning to him. Pt assisted throughout BLE exercises in bed, pt at best able to contribute to 30% of A/ROM, but with heavy grimacing and pain increases. Hip flexion angle tolerance is improved this date to ~70 degrees. Pt educated on importance of consistency with Left BKA hygiene, author cleaned shrinker with purple top wipe, then assisted pt in donning his prosthesis and components. Author assists primarily in supporting limb so patient can reach the end. Pt also noted to have urinary incontinence in bed, hence pt assisted with linen change, with a protective layer around the right thigh to keep dry short term- RN notified of condom cath doffing. Pt remains pain limited, but very much motivated to participate and improve his functional capacity. Pt's cognition and memory still seem somewhat altered compared to baseline. No supplemental O2 needed in session.     Follow Up Recommendations  SNF;Supervision/Assistance - 24 hour     Equipment Recommendations  None recommended by PT    Recommendations for Other Services       Precautions / Restrictions Precautions Precautions: Fall;Posterior Hip Precaution Booklet Issued: Yes (comment) Restrictions Weight Bearing Restrictions: Yes RLE Weight Bearing: Partial weight bearing    Mobility  Bed Mobility Overal bed mobility: Needs Assistance Bed Mobility: Rolling Rolling: Total assist;+2 for physical assistance(Once on side, pt  able to maintain position with BUE suport on rail x30sec for linen change)         General bed mobility comments: pillow between knees to limit adduction in sidelying  Transfers Overall transfer level: (deferred this session 2/2 weakness and high pain levels)                  Ambulation/Gait                 Stairs             Wheelchair Mobility    Modified Rankin (Stroke Patients Only)       Balance                                            Cognition Arousal/Alertness: Awake/alert Behavior During Therapy: WFL for tasks assessed/performed Overall Cognitive Status: Impaired/Different from baseline Area of Impairment: Memory                     Memory: Decreased short-term memory         General Comments: Pt again focused on the pain from surgery, lacking full awareness of situation; asks questiosn about details of surgery ,unable to recall what he broke inittially      Exercises Total Joint Exercises Ankle Circles/Pumps: 15 reps;Right;Limitations;AROM Ankle Circles/Pumps Limitations: slow with impaired activation Quad Sets: 15 reps;AROM;Strengthening;Left;Limitations Quad Sets Limitations: multimodal cues for instruction and to attend to task Gluteal Sets: Strengthening;Left;AROM;15 reps;Limitations Gluteal Sets Limitations: multimodal cues for instruction  and to attend to task Short Arc Quad: AROM;Strengthening;15 reps;Both;PROM;Limitations Short Arc Quad Limitations: multimodal cues for instruction and to attend to task; essentially P/ROM for >50% of reps on right, at best pt performs 25% of work, heavy pain limitations Heel Slides: AAROM;10 reps;Right Heel Slides Limitations: HIp flexion angle tolerance is improved this date compared to POD1, now tolerating up to 70 degrees hip flexion with grimacing and guarding Hip ABduction/ADduction: 15 reps;Both;PROM;AROM;Limitations Hip Abduction/Adduction Limitations:  multimodal cues for instruction and to attend to task; pain inhibition limits contribution to less than 30% on right Other Exercises Other Exercises: Assisted through donning of shrinker, 2 socks, pelight liner, and prosthesisl; instructed to wear for a few hours today for optimal limb management    General Comments        Pertinent Vitals/Pain Pain Assessment: 0-10 Pain Score: 7  Pain Location: RLE (mid portion anterolateral thigh)  Pain Descriptors / Indicators: Grimacing;Guarding;Sore Pain Intervention(s): Limited activity within patient's tolerance;Monitored during session;Premedicated before session;Repositioned    Home Living                      Prior Function            PT Goals (current goals can now be found in the care plan section) Acute Rehab PT Goals Patient Stated Goal: decrease his pain, return to walking PT Goal Formulation: With patient Time For Goal Achievement: 10/28/18 Potential to Achieve Goals: Fair Progress towards PT goals: Progressing toward goals    Frequency    BID      PT Plan Current plan remains appropriate    Co-evaluation              AM-PAC PT "6 Clicks" Mobility   Outcome Measure  Help needed turning from your back to your side while in a flat bed without using bedrails?: A Lot Help needed moving from lying on your back to sitting on the side of a flat bed without using bedrails?: A Lot Help needed moving to and from a bed to a chair (including a wheelchair)?: Total Help needed standing up from a chair using your arms (e.g., wheelchair or bedside chair)?: Total Help needed to walk in hospital room?: Total Help needed climbing 3-5 steps with a railing? : Total 6 Click Score: 8    End of Session   Activity Tolerance: Patient limited by fatigue;Patient limited by pain;Patient tolerated treatment well Patient left: with nursing/sitter in room;in bed;with bed alarm set;with call bell/phone within reach;with SCD's  reapplied(prosthesis donned and socvks protected from potential urine soiling) Nurse Communication: Mobility status(perineal redness, condom cath doffed, prosthetic components need to be be protected from urine/stool) PT Visit Diagnosis: Other abnormalities of gait and mobility (R26.89);Difficulty in walking, not elsewhere classified (R26.2)     Time: 2297-9892 PT Time Calculation (min) (ACUTE ONLY): 37 min  Charges:  $Therapeutic Exercise: 8-22 mins $Therapeutic Activity: 8-22 mins                     10:34 AM, 10/18/18 Etta Grandchild, PT, DPT Physical Therapist - [facility]  [phone] ([place])    Manly Nestle C 10/18/2018, 10:30 AM

## 2018-10-18 NOTE — Consult Note (Addendum)
[facility]  CONSULT NOTE    Date: 10/18/2018                  Patient Name:  [name]  MRN: [id number]  DOB: 05/21/1935  Age / Sex: 84 y.o., male         PCP: Ezequiel Kayser, MD                 Service Requesting Consult: Dr. Posey Pronto                  Reason for Consult: Acute Kidney Injury on CKD            History of Present Illness: [name] is a 84 y.o.  male with , who was admitted to [facility] on 10/13/2018 for Fall, closed fracutre of right hip with surgical repair and Acute Kidney Injury on CKD stage III with a history of insulin dependent diabetes, Congestive heart failure,  atrial fibrillation, Hypertension, chronic kidney disease stage III. He is reporting some hip pain on the lateral aspect of his right hip. Patient had a fall at home which lead to his arrival at the ED on 5/22, he had a Right hip hemiarthroplasty performed by Dr. Earnestine Leys on 5/22.   Per discussion with patient, he has never seen a nephrologist previously.  He is planning on going to a skilled nursing facility [facility]) later today for rehab, typically lives at home with his wife.  Medications: Outpatient medications: Medications Prior to Admission  Medication Sig Dispense Refill Last Dose  . amiodarone (PACERONE) 200 MG tablet Take 200 mg by mouth daily.   10/13/2018 at 0600  . atorvastatin (LIPITOR) 80 MG tablet Take 80 mg by mouth every evening.   10/12/2018 at 1800  . diltiazem (CARDIZEM CD) 120 MG 24 hr capsule Take 120 mg by mouth daily.   10/13/2018 at 0600  . empagliflozin (JARDIANCE) 25 MG TABS tablet Take 25 mg by mouth daily.   10/13/2018 at 0600  . furosemide (LASIX) 20 MG tablet Take 1 tablet (20 mg total) by mouth daily. 30 tablet 2 10/13/2018 at 0600  . HUMALOG KWIKPEN 100 UNIT/ML KwikPen Inject 3 Units into the skin 3 (three) times daily with meals.   10/13/2018 at 0600  . insulin lispro (HUMALOG) 100 UNIT/ML injection Inject 0.03 mLs (3 Units total) into  the skin 3 (three) times daily with meals. (Patient taking differently: Inject 3 Units into the skin 3 (three) times daily with meals. Plus 1 additional unit for every 50 BG points over 200 (max 5 additional units per dose)) 10 mL 0 10/13/2018 at 0600  . metoprolol tartrate (LOPRESSOR) 25 MG tablet Take 1 tablet (25 mg total) by mouth 3 (three) times daily. 90 tablet 0 10/13/2018 at 0600  . nitroGLYCERIN (NITROSTAT) 0.4 MG SL tablet Place 1 tablet (0.4 mg total) under the tongue every 5 (five) minutes as needed for chest pain. 20 tablet 0 prn at prn  . potassium chloride SA (K-DUR,KLOR-CON) 20 MEQ tablet Take 20 mEq by mouth daily.   10/13/2018 at 0600  . ranolazine (RANEXA) 500 MG 12 hr tablet Take 1 tablet (500 mg total) by mouth 2 (two) times daily. 60 tablet 0 10/13/2018 at 0600  . [DISCONTINUED] insulin glargine (LANTUS) 100 UNIT/ML injection Inject 0.1 mLs (10 Units total) into the skin at bedtime. (Patient taking differently: Inject 15 Units into the skin at bedtime. ) 10 mL 11 10/12/2018 at Unknown  time  . amiodarone (PACERONE) 400 MG tablet Take 1 tablet (400 mg total) by mouth 2 (two) times daily. (Patient not taking: Reported on 10/13/2018) 60 tablet 0 Not Taking at Unknown time  . doxycycline (VIBRAMYCIN) 100 MG capsule Take 1 capsule (100 mg total) by mouth 2 (two) times daily. (Patient not taking: Reported on 10/13/2018) 20 capsule 0 Not Taking at Unknown time    Current medications: Current Facility-Administered Medications  Medication Dose Route Frequency Provider Last Rate Last Dose  . 0.9 %  sodium chloride infusion   Intravenous Continuous [name] Mandes, MD   Stopped at 10/18/18 1034  . acetaminophen (TYLENOL) tablet 325 mg  325 mg Oral Q6H PRN Earnestine Leys, MD   325 mg at 10/17/18 0345  . alum & mag hydroxide-simeth (MAALOX/MYLANTA) 200-200-20 MG/5ML suspension 30 mL  30 mL Oral Q4H PRN Earnestine Leys, MD      . amiodarone (PACERONE) tablet 200 mg  200 mg Oral Daily Earnestine Leys, MD    200 mg at 10/18/18 1018  . atorvastatin (LIPITOR) tablet 80 mg  80 mg Oral QPM Earnestine Leys, MD   80 mg at 10/17/18 1723  . bisacodyl (DULCOLAX) suppository 10 mg  10 mg Rectal Daily PRN Earnestine Leys, MD      . diltiazem (CARDIZEM CD) 24 hr capsule 120 mg  120 mg Oral Daily Earnestine Leys, MD   120 mg at 10/18/18 0748  . docusate sodium (COLACE) capsule 100 mg  100 mg Oral BID Earnestine Leys, MD   100 mg at 10/18/18 1018  . ferrous sulfate tablet 325 mg  325 mg Oral Q breakfast Earnestine Leys, MD   325 mg at 10/18/18 0748  . gabapentin (NEURONTIN) capsule 300 mg  300 mg Oral QHS [name] Mandes, MD   300 mg at 10/17/18 2138  . HYDROcodone-acetaminophen (NORCO/VICODIN) 5-325 MG per tablet 1 tablet  1 tablet Oral Q6H PRN Earnestine Leys, MD   1 tablet at 10/18/18 [phone]  . insulin aspart (novoLOG) injection 0-5 Units  0-5 Units Subcutaneous QHS Earnestine Leys, MD   3 Units at 10/17/18 2137  . insulin aspart (novoLOG) injection 0-9 Units  0-9 Units Subcutaneous TID WC Earnestine Leys, MD   3 Units at 10/18/18 [phone]  . insulin aspart (novoLOG) injection 3 Units  3 Units Subcutaneous TID WC Earnestine Leys, MD   3 Units at 10/18/18 [phone]  . insulin glargine (LANTUS) injection 15 Units  15 Units Subcutaneous QHS Earnestine Leys, MD   15 Units at 10/17/18 2144  . menthol-cetylpyridinium (CEPACOL) lozenge 3 mg  1 lozenge Oral PRN Earnestine Leys, MD       Or  . phenol (CHLORASEPTIC) mouth spray 1 spray  1 spray Mouth/Throat PRN Earnestine Leys, MD      . methocarbamol (ROBAXIN) tablet 500 mg  500 mg Oral Q6H PRN Earnestine Leys, MD       Or  . methocarbamol (ROBAXIN) 500 mg in dextrose 5 % 50 mL IVPB  500 mg Intravenous Q6H PRN Earnestine Leys, MD      . metoCLOPramide (REGLAN) tablet 5-10 mg  5-10 mg Oral Q8H PRN Earnestine Leys, MD       Or  . metoCLOPramide (REGLAN) injection 5 mg  5 mg Intravenous Q8H PRN Earnestine Leys, MD      . metoprolol tartrate (LOPRESSOR) tablet 25 mg  25 mg Oral TID Earnestine Leys, MD    25 mg at 10/18/18 1018  . morphine 2 MG/ML injection 1 mg  1  mg Intravenous Q4H PRN [name] Mandes, MD   1 mg at 10/18/18 0517  . nitroGLYCERIN (NITROSTAT) SL tablet 0.4 mg  0.4 mg Sublingual Q5 min PRN Earnestine Leys, MD      . ondansetron [facility]) tablet 4 mg  4 mg Oral Q6H PRN Earnestine Leys, MD       Or  . ondansetron [facility]) injection 4 mg  4 mg Intravenous Q6H PRN Earnestine Leys, MD   4 mg at 10/13/18 2040  . polyethylene glycol (MIRALAX / GLYCOLAX) packet 17 g  17 g Oral Daily PRN Earnestine Leys, MD      . Rivaroxaban Alveda Reasons) tablet 15 mg  15 mg Oral Q supper Rocky Morel, RPH   15 mg at 10/17/18 1723  . sodium phosphate (FLEET) 7-19 GM/118ML enema 1 enema  1 enema Rectal Once PRN Earnestine Leys, MD      . zolpidem Lorrin Mais) tablet 5 mg  5 mg Oral QHS PRN Earnestine Leys, MD          Allergies: Allergies  Allergen Reactions  . Contrast Media [Iodinated Diagnostic Agents] Shortness Of Breath    SOB  . Iohexol Shortness Of Breath     Desc: Respiratory Distress, Laryngedema, diaphoresis, Onset Date: [id number]   . Metrizamide Shortness Of Breath    SOB SOB SOB   . Latex Rash      Past Medical History: Past Medical History:  Diagnosis Date  . Arthritis   . Atrial fibrillation ([place])   . Carcinoma of prostate ([place])   . CHF (congestive heart failure) ([place])   . Chronic kidney disease   . Diabetes mellitus without complication ([place])   . ED (erectile dysfunction)   . Frequent urination   . Hyperlipidemia   . Hypertension   . Moderate mitral insufficiency   . Peripheral vascular disease ([place])   . Pneumonia 04/2016  . Prostate cancer ([place])   . Sleep apnea    OSA--USE C-PAP  . Ulcer of left foot due to type 2 diabetes mellitus ([place])   . Urinary stress incontinence, male      Past Surgical History: Past Surgical History:  Procedure Laterality Date  . AMPUTATION Left 01/12/2017   Procedure: AMPUTATION BELOW KNEE;  Surgeon: Algernon Huxley, MD;  Location: [facility] ORS;   Service: General;  Laterality: Left;  . APLIGRAFT PLACEMENT Left 10/15/2016   Procedure: APLIGRAFT PLACEMENT;  Surgeon: Albertine Patricia, DPM;  Location: [facility] ORS;  Service: Podiatry;  Laterality: Left;  necrotic ulcer  . CHOLECYSTECTOMY    . EYE SURGERY Bilateral    Cataract Extraction with IOL  . HIP ARTHROPLASTY Right 10/13/2018   Procedure: ARTHROPLASTY BIPOLAR HIP (HEMIARTHROPLASTY);  Surgeon: Earnestine Leys, MD;  Location: [facility] ORS;  Service: Orthopedics;  Laterality: Right;  . IRRIGATION AND DEBRIDEMENT FOOT Left 10/15/2016   Procedure: IRRIGATION AND DEBRIDEMENT FOOT-EXCISIONAL DEBRIDEMENT OF SKIN 3RD, 4TH AND 5TH TOES WITH APPLICATION OF APLIGRAFT;  Surgeon: Albertine Patricia, DPM;  Location: [facility] ORS;  Service: Podiatry;  Laterality: Left;  necrotic,gangrene  . IRRIGATION AND DEBRIDEMENT FOOT Left 12/09/2016   Procedure: IRRIGATION AND DEBRIDEMENT FOOT-MUSCLE/FASCIA, RESECTION OF FOURTH AND FIFTH METATARSAL NECROTIC BONE;  Surgeon: Albertine Patricia, DPM;  Location: [facility] ORS;  Service: Podiatry;  Laterality: Left;  . LEFT HEART CATH AND CORONARY ANGIOGRAPHY N/A 06/05/2018   Procedure: LEFT HEART CATH AND CORONARY ANGIOGRAPHY;  Surgeon: Yolonda Kida, MD;  Location: [place] [facility];  Service: Cardiovascular;  Laterality: N/A;  . LOWER EXTREMITY ANGIOGRAPHY Left 08/16/2016  Procedure: Lower Extremity Angiography;  Surgeon: Algernon Huxley, MD;  Location: [place] [facility];  Service: Cardiovascular;  Laterality: Left;  . LOWER EXTREMITY ANGIOGRAPHY Left 09/01/2016   Procedure: Lower Extremity Angiography;  Surgeon: Algernon Huxley, MD;  Location: [place] [facility];  Service: Cardiovascular;  Laterality: Left;  . LOWER EXTREMITY ANGIOGRAPHY Left 10/14/2016   Procedure: Lower Extremity Angiography;  Surgeon: Algernon Huxley, MD;  Location: [place] [facility];  Service: Cardiovascular;  Laterality: Left;  . LOWER EXTREMITY ANGIOGRAPHY Left 12/20/2016   Procedure: Lower Extremity Angiography;   Surgeon: Algernon Huxley, MD;  Location: [place] [facility];  Service: Cardiovascular;  Laterality: Left;  . LOWER EXTREMITY INTERVENTION  09/01/2016   Procedure: Lower Extremity Intervention;  Surgeon: Algernon Huxley, MD;  Location: [place] [facility];  Service: Cardiovascular;;  . PROSTATE SURGERY     removal  . TONSILLECTOMY     as a child     Family History: Family History  Problem Relation Age of Onset  . Hypertension Mother   . Diabetes Mother   . Prostate cancer Neg Hx   . Bladder Cancer Neg Hx   . Kidney disease Neg Hx      Social History: Social History   Socioeconomic History  . Marital status: Married    Spouse name: Not on file  . Number of children: Not on file  . Years of education: Not on file  . Highest education level: Not on file  Occupational History  . Occupation: retired  Scientific laboratory technician  . Financial resource strain: Not on file  . Food insecurity:    Worry: Not on file    Inability: Not on file  . Transportation needs:    Medical: Not on file    Non-medical: Not on file  Tobacco Use  . Smoking status: Former Smoker    Packs/day: 0.25    Types: Cigarettes    Last attempt to quit: 10/14/1994    Years since quitting: 24.0  . Smokeless tobacco: Never Used  Substance and Sexual Activity  . Alcohol use: No    Alcohol/week: 0.0 standard drinks  . Drug use: No  . Sexual activity: Not on file  Lifestyle  . Physical activity:    Days per week: Not on file    Minutes per session: Not on file  . Stress: Not on file  Relationships  . Social connections:    Talks on phone: Not on file    Gets together: Not on file    Attends religious service: Not on file    Active member of club or organization: Not on file    Attends meetings of clubs or organizations: Not on file    Relationship status: Not on file  . Intimate partner violence:    Fear of current or ex partner: Not on file    Emotionally abused: Not on file    Physically abused: Not on file     Forced sexual activity: Not on file  Other Topics Concern  . Not on file  Social History Narrative  . Not on file     Review of Systems: Review of Systems  Constitutional: Negative for chills, fever and weight loss.  HENT: Negative for sore throat.   Respiratory: Negative for cough, sputum production, shortness of breath and wheezing.   Cardiovascular: Negative for chest pain, palpitations, orthopnea, claudication and leg swelling.  Gastrointestinal: Negative for abdominal pain, constipation, diarrhea, nausea and vomiting.  Genitourinary: Negative  for dysuria, frequency, hematuria and urgency.  Musculoskeletal: Positive for joint pain. Negative for back pain, myalgias and neck pain.  Skin: Negative for rash.  Neurological: Negative for dizziness and headaches.  Endo/Heme/Allergies: Negative for environmental allergies and polydipsia. Does not bruise/bleed easily.  Psychiatric/Behavioral: Negative.      Vital Signs: Blood pressure (!) 151/67, pulse 65, temperature 98.5 F (36.9 C), temperature source Oral, resp. rate 16, height 6\' 1"  (1.854 m), weight 79.4 kg, SpO2 94 %.  Weight trends: Filed Weights   10/13/18 0804 10/18/18 0746  Weight: 79.4 kg 79.4 kg    Physical Exam: General: NAD,   Head: Normocephalic, atraumatic. Moist oral mucosal membranes  Eyes: Anicteric, PERRL  Neck: Supple, trachea midline  Lungs:  Clear to auscultation bilaterally   Heart: Regular rate and rhythm  Abdomen:  Soft, nontender  Extremities:  No peripheral edema, Left BKA currently wearing prosthesis   Neurologic: Nonfocal, moving all four extremities  Skin: No lesions  GU External Urinary Catheter inplace      Lab results: Basic Metabolic Panel: Recent Labs  Lab 10/16/18 0551 10/17/18 0554 10/17/18 1318 10/18/18 0421  NA 133* 136  --  137  K 4.5 4.6  --  4.6  CL 102 109  --  109  CO2 21* 19*  --  21*  GLUCOSE 180* 150*  --  238*  BUN 56* 57*  --  55*  CREATININE 2.72* 2.40* 2.31*  2.36*  CALCIUM 8.2* 8.0*  --  8.0*    Liver Function Tests: No results for input(s): AST, ALT, ALKPHOS, BILITOT, PROT, ALBUMIN in the last 168 hours. No results for input(s): LIPASE, AMYLASE in the last 168 hours. No results for input(s): AMMONIA in the last 168 hours.  CBC: Recent Labs  Lab 10/13/18 0836 10/14/18 0642 10/15/18 0557 10/16/18 0551  WBC 13.6* 12.7* 11.7* 12.1*  HGB 15.0 12.7* 11.8* 11.8*  HCT 43.6 38.1* 36.4* 36.2*  MCV 86.5 87.8 90.3 88.9  PLT 223 173 171 185    Cardiac Enzymes: No results for input(s): CKTOTAL, CKMB, CKMBINDEX, TROPONINI in the last 168 hours.  BNP: Invalid input(s): POCBNP  CBG: Recent Labs  Lab 10/17/18 0756 10/17/18 1154 10/17/18 1715 10/17/18 2120 10/18/18 0804  GLUCAP 141* 161* 278* 260* 73*    Microbiology: Results for orders placed or performed during the hospital encounter of 10/13/18  SARS Coronavirus 2 (CEPHEID - Performed in [place] hospital lab), Hosp Order     Status: None   Collection Time: 10/13/18  9:32 AM  Result Value Ref Range Status   SARS Coronavirus 2 NEGATIVE NEGATIVE Final    Comment: (NOTE) If result is NEGATIVE SARS-CoV-2 target nucleic acids are NOT DETECTED. The SARS-CoV-2 RNA is generally detectable in upper and lower  respiratory specimens during the acute phase of infection. The lowest  concentration of SARS-CoV-2 viral copies this assay can detect is 250  copies / mL. A negative result does not preclude SARS-CoV-2 infection  and should not be used as the sole basis for treatment or other  patient management decisions.  A negative result may occur with  improper specimen collection / handling, submission of specimen other  than nasopharyngeal swab, presence of viral mutation(s) within the  areas targeted by this assay, and inadequate number of viral copies  (<250 copies / mL). A negative result must be combined with clinical  observations, patient history, and epidemiological  information. If result is POSITIVE SARS-CoV-2 target nucleic acids are DETECTED. The SARS-CoV-2 RNA is  generally detectable in upper and lower  respiratory specimens dur ing the acute phase of infection.  Positive  results are indicative of active infection with SARS-CoV-2.  Clinical  correlation with patient history and other diagnostic information is  necessary to determine patient infection status.  Positive results do  not rule out bacterial infection or co-infection with other viruses. If result is PRESUMPTIVE POSTIVE SARS-CoV-2 nucleic acids MAY BE PRESENT.   A presumptive positive result was obtained on the submitted specimen  and confirmed on repeat testing.  While 2019 novel coronavirus  (SARS-CoV-2) nucleic acids may be present in the submitted sample  additional confirmatory testing may be necessary for epidemiological  and / or clinical management purposes  to differentiate between  SARS-CoV-2 and other Sarbecovirus currently known to infect humans.  If clinically indicated additional testing with an alternate test  methodology [phone]) is advised. The SARS-CoV-2 RNA is generally  detectable in upper and lower respiratory sp ecimens during the acute  phase of infection. The expected result is Negative. Fact Sheet for Patients:  StrictlyIdeas.no Fact Sheet for Healthcare Providers: BankingDealers.co.za This test is not yet approved or cleared by the [place] FDA and has been authorized for detection and/or diagnosis of SARS-CoV-2 by FDA under an Emergency Use Authorization (EUA).  This EUA will remain in effect (meaning this test can be used) for the duration of the COVID-19 declaration under Section 564(b)(1) of the Act, 21 U.S.C. section 360bbb-3(b)(1), unless the authorization is terminated or revoked sooner. Performed at [facility], [address] [place], [place] [address]   Surgical PCR screen      Status: Abnormal   Collection Time: 10/13/18 12:05 PM  Result Value Ref Range Status   MRSA, PCR NEGATIVE NEGATIVE Final   Staphylococcus aureus POSITIVE (A) NEGATIVE Final    Comment: (NOTE) The Xpert SA Assay (FDA approved for NASAL specimens in patients 34 years of age and older), is one component of a comprehensive surveillance program. It is not intended to diagnose infection nor to guide or monitor treatment. Performed at [facility], [address] [place], [place] [address]     Coagulation Studies: No results for input(s): LABPROT, INR in the last 72 hours.  Urinalysis: No results for input(s): COLORURINE, LABSPEC, PHURINE, GLUCOSEU, HGBUR, BILIRUBINUR, KETONESUR, PROTEINUR, UROBILINOGEN, NITRITE, LEUKOCYTESUR in the last 72 hours.  Invalid input(s): APPERANCEUR    Imaging:  No results found.   Assessment & Plan: Mr. TORRI LANGSTON is a 84 y.o.  male with , who was admitted to [facility] on 10/13/2018 for fall, closed fracture of right hip and AKI on CKD with a history of insulin dependent diabetes, Congestive heart failure,  atrial fibrillation, Hypertension, chronic kidney disease stage III.   1. Acute Kidney Injury on Chronic Kidney Disease likely secondary to pre-renal azotemia/ dehydration.  - Baseline creatinine of approximately 1.44. Peak creatinine of 2.72 on  May 25 trending down to 2.36 on May 27.  - Holding Lasix at this time.  - Patient will need to follow up in the office in approximately one-two weeks.   2. Chronic A. Fib  - Continue current management   3. Chronic CHF  -Holding lasix at this time   4. Insulin Dependent Diabetes - continue current management       LOS: [place] 5/27/202010:43 AM     Patient seen and examined with physician assistant. Agree with above plan.   Lavonia Dana, [facility]  5/27/20203:19  PM   

## 2018-10-18 NOTE — Discharge Summary (Addendum)
[facility] NAME: [name]    MR#:  [id number]  DATE OF BIRTH:  1934/10/21  DATE OF ADMISSION:  10/13/2018 ADMITTING PHYSICIAN: Jesse Mandes, MD  DATE OF DISCHARGE: 10/18/2018  PRIMARY CARE PHYSICIAN: Jesse Kayser, MD    ADMISSION DIAGNOSIS:  Fall, initial encounter [W19.XXXA] Closed fracture of right hip, initial encounter ([place]) [S72.001A] Acute kidney injury superimposed on chronic kidney disease ([place]) [N17.9, N18.9]  DISCHARGE DIAGNOSIS:  Right femur fracture s/p Right Hip arthroplasty post day 5 Acute on Chronic CKD-3 Afib--on oral anticoagulaiton  SECONDARY DIAGNOSIS:   Past Medical History:  Diagnosis Date  . Arthritis   . Atrial fibrillation ([place])   . Carcinoma of prostate ([place])   . CHF (congestive heart failure) ([place])   . Chronic kidney disease   . Diabetes mellitus without complication ([place])   . ED (erectile dysfunction)   . Frequent urination   . Hyperlipidemia   . Hypertension   . Moderate mitral insufficiency   . Peripheral vascular disease ([place])   . Pneumonia 04/2016  . Prostate cancer ([place])   . Sleep apnea    OSA--USE C-PAP  . Ulcer of left foot due to type 2 diabetes mellitus ([place])   . Urinary stress incontinence, male     HOSPITAL COURSE:   [name] a84 y.o.malewith a known history of atrial fibrillation, chronic is supposed to be on Xarelto how were not taking it since January, chronic congestive heart failure systolic, diabetes on insulin, chronic kidney disease stage III comes to the emergency room after he had a mechanical fall while he was in the parking lot his leg gave out and started hurting on his right hip.  1.Right hip fracture/femoral neck fracture status post mechanical fall -postop day 5  right hemi arthroplasty -[name] - He uses walker/cane. He has a left prosthetic leg. -Continue beta blockers perioperative -initiated  PT.  2.Chronic atrial fibrillation -continue metoprolol, diltiazem, amiodarone -placed back on Xarelto-- curbsided cardiology and agrees with plan -hemoglobin stable  3.Chronic systolic congestive heart failure -hold lasix -patient not in heart failure -last echo in January showed EF of 45 to 50%  4.Type II diabetes on insulin -cont Lantus and NovoLog  -d/c Jardiance  5.Hypertension resume home meds  6.DVT prophylaxis Xarelto (renal dose)  7. Acute on chronic renal failure secondary to poor PO intake.  -received IV fluids  -Baseline creatinine 1.44 -creat   2.44--2.7--2.40--2.3 - Patient advised oral fluid intake. -d/ced lasix. Pt will follow up with [name] as out pt for CKD  D/c to rehab today. Informed wife [name] on the phone CONSULTS OBTAINED:  Treatment Team:  Jesse Leys, MD  DRUG ALLERGIES:   Allergies  Allergen Reactions  . Contrast Media [Iodinated Diagnostic Agents] Shortness Of Breath    SOB  . Iohexol Shortness Of Breath     Desc: Respiratory Distress, Laryngedema, diaphoresis, Onset Date: [id number]   . Metrizamide Shortness Of Breath    SOB SOB SOB   . Latex Rash    DISCHARGE MEDICATIONS:   Allergies as of 10/18/2018      Reactions   Contrast Media [iodinated Diagnostic Agents] Shortness Of Breath   SOB   Iohexol Shortness Of Breath    Desc: Respiratory Distress, Laryngedema, diaphoresis, Onset Date: [id number]   Metrizamide Shortness Of Breath   SOB SOB SOB   Latex Rash      Medication List    STOP taking these medications  doxycycline 100 MG capsule Commonly known as:  VIBRAMYCIN   furosemide 20 MG tablet Commonly known as:  Lasix   Jardiance 25 MG Tabs tablet Generic drug:  empagliflozin   potassium chloride SA 20 MEQ tablet Commonly known as:  K-DUR   ranolazine 500 MG 12 hr tablet Commonly known as:  RANEXA     TAKE these medications   amiodarone 200 MG tablet Commonly known as:  PACERONE Take  200 mg by mouth daily. What changed:  Another medication with the same name was removed. Continue taking this medication, and follow the directions you see here.   atorvastatin 80 MG tablet Commonly known as:  LIPITOR Take 80 mg by mouth every evening.   diltiazem 120 MG 24 hr capsule Commonly known as:  CARDIZEM CD Take 120 mg by mouth daily.   ferrous sulfate 325 (65 FE) MG tablet Take 1 tablet (325 mg total) by mouth daily with breakfast.   HYDROcodone-acetaminophen 5-325 MG tablet Commonly known as:  NORCO/VICODIN Take 1-2 tablets by mouth every 6 (six) hours as needed for moderate pain (pain score 4-6).   insulin glargine 100 UNIT/ML injection Commonly known as:  LANTUS Inject 0.15 mLs (15 Units total) into the skin at bedtime.   insulin lispro 100 UNIT/ML injection Commonly known as:  HumaLOG Inject 0.03 mLs (3 Units total) into the skin 3 (three) times daily with meals. What changed:   additional instructions Another medication with the same name was removed. Continue taking this medication, and follow the directions you see here.   methocarbamol 500 MG tablet Commonly known as:  ROBAXIN Take 1 tablet (500 mg total) by mouth every 6 (six) hours as needed for muscle spasms.   metoprolol tartrate 25 MG tablet Commonly known as:  LOPRESSOR Take 1 tablet (25 mg total) by mouth 3 (three) times daily.   nitroGLYCERIN 0.4 MG SL tablet Commonly known as:  NITROSTAT Place 1 tablet (0.4 mg total) under the tongue every 5 (five) minutes as needed for chest pain.   Rivaroxaban 15 MG Tabs tablet Commonly known as:  XARELTO Take 1 tablet (15 mg total) by mouth daily with supper.       If you experience worsening of your admission symptoms, develop shortness of breath, life threatening emergency, suicidal or homicidal thoughts you must seek medical attention immediately by calling 911 or calling your MD immediately  if symptoms less severe.  You Must read complete  instructions/literature along with all the possible adverse reactions/side effects for all the Medicines you take and that have been prescribed to you. Take any new Medicines after you have completely understood and accept all the possible adverse reactions/side effects.   Please note  You were cared for by a hospitalist during your hospital stay. If you have any questions about your discharge medications or the care you received while you were in the hospital after you are discharged, you can call the unit and asked to speak with the hospitalist on call if the hospitalist that took care of you is not available. Once you are discharged, your primary care physician will handle any further medical issues. Please note that NO REFILLS for any discharge medications will be authorized once you are discharged, as it is imperative that you return to your primary care physician (or establish a relationship with a primary care physician if you do not have one) for your aftercare needs so that they can reassess your need for medications and monitor your lab values. Today  SUBJECTIVE    I am uncomfortable VITAL SIGNS:  Blood pressure (!) 158/60, pulse (!) 49, temperature 98.8 F (37.1 C), temperature source Oral, resp. rate 18, weight 79.4 kg, SpO2 95 %.  I/O:    Intake/Output Summary (Last 24 hours) at 10/18/2018 0733 Last data filed at 10/17/2018 1851 Gross per 24 hour  Intake 600 ml  Output 1000 ml  Net -400 ml    PHYSICAL EXAMINATION:  GENERAL:  84 y.o.-year-old patient lying in the bed with no acute distress.  EYES: Pupils equal, round, reactive to light and accommodation. No scleral icterus. Extraocular muscles intact.  HEENT: Head atraumatic, normocephalic. Oropharynx and nasopharynx clear.  NECK:  Supple, no jugular venous distention. No thyroid enlargement, no tenderness.  LUNGS: Normal breath sounds bilaterally, no wheezing, rales,rhonchi or crepitation. No use of accessory muscles of  respiration.  CARDIOVASCULAR: S1, S2 normal. No murmurs, rubs, or gallops.  ABDOMEN: Soft, non-tender, non-distended. Bowel sounds present. No organomegaly or mass.  EXTREMITIES: No pedal edema, cyanosis, or clubbing. Surgical dressing+ NEUROLOGIC: Cranial nerves II through XII are intact. Muscle strength 5/5 in all extremities. Sensation intact. Gait not checked.  PSYCHIATRIC: The patient is alert and oriented x 3.  SKIN: No obvious rash, lesion, or ulcer.   DATA REVIEW:   CBC  Recent Labs  Lab 10/16/18 0551  WBC 12.1*  HGB 11.8*  HCT 36.2*  PLT 185    Chemistries  Recent Labs  Lab 10/18/18 0421  NA 137  K 4.6  CL 109  CO2 21*  GLUCOSE 238*  BUN 55*  CREATININE 2.36*  CALCIUM 8.0*    Microbiology Results   Recent Results (from the past 240 hour(s))  SARS Coronavirus 2 (CEPHEID - Performed in [facility] hospital lab), Hosp Order     Status: None   Collection Time: 10/13/18  9:32 AM  Result Value Ref Range Status   SARS Coronavirus 2 NEGATIVE NEGATIVE Final    Comment: (NOTE) If result is NEGATIVE SARS-CoV-2 target nucleic acids are NOT DETECTED. The SARS-CoV-2 RNA is generally detectable in upper and lower  respiratory specimens during the acute phase of infection. The lowest  concentration of SARS-CoV-2 viral copies this assay can detect is 250  copies / mL. A negative result does not preclude SARS-CoV-2 infection  and should not be used as the sole basis for treatment or other  patient management decisions.  A negative result may occur with  improper specimen collection / handling, submission of specimen other  than nasopharyngeal swab, presence of viral mutation(s) within the  areas targeted by this assay, and inadequate number of viral copies  (<250 copies / mL). A negative result must be combined with clinical  observations, patient history, and epidemiological information. If result is POSITIVE SARS-CoV-2 target nucleic acids are DETECTED. The  SARS-CoV-2 RNA is generally detectable in upper and lower  respiratory specimens dur ing the acute phase of infection.  Positive  results are indicative of active infection with SARS-CoV-2.  Clinical  correlation with patient history and other diagnostic information is  necessary to determine patient infection status.  Positive results do  not rule out bacterial infection or co-infection with other viruses. If result is PRESUMPTIVE POSTIVE SARS-CoV-2 nucleic acids MAY BE PRESENT.   A presumptive positive result was obtained on the submitted specimen  and confirmed on repeat testing.  While 2019 novel coronavirus  (SARS-CoV-2) nucleic acids may be present in the submitted sample  additional confirmatory testing may be necessary for epidemiological  and / or clinical management purposes  to differentiate between  SARS-CoV-2 and other Sarbecovirus currently known to infect humans.  If clinically indicated additional testing with an alternate test  methodology [phone]) is advised. The SARS-CoV-2 RNA is generally  detectable in upper and lower respiratory sp ecimens during the acute  phase of infection. The expected result is Negative. Fact Sheet for Patients:  StrictlyIdeas.no Fact Sheet for Healthcare Providers: BankingDealers.co.za This test is not yet approved or cleared by the [place] FDA and has been authorized for detection and/or diagnosis of SARS-CoV-2 by FDA under an Emergency Use Authorization (EUA).  This EUA will remain in effect (meaning this test can be used) for the duration of the COVID-19 declaration under Section 564(b)(1) of the Act, 21 U.S.C. section 360bbb-3(b)(1), unless the authorization is terminated or revoked sooner. Performed at [facility], [address] [place], [place] [address]   Surgical PCR screen     Status: Abnormal   Collection Time: 10/13/18 12:05 PM  Result Value Ref Range Status    MRSA, PCR NEGATIVE NEGATIVE Final   Staphylococcus aureus POSITIVE (A) NEGATIVE Final    Comment: (NOTE) The Xpert SA Assay (FDA approved for NASAL specimens in patients 36 years of age and older), is one component of a comprehensive surveillance program. It is not intended to diagnose infection nor to guide or monitor treatment. Performed at [facility], [address] [place], [place] [address]     RADIOLOGY:  No results found.   CODE STATUS:     Code Status Orders  (From admission, onward)         Start     Ordered   10/13/18 1145  Full code  Continuous     10/13/18 1144        Code Status History    Date Active Date Inactive Code Status Order ID Comments User Context   06/03/2018 0301 06/09/2018 2013 Full Code [id number]  Lance Coon, MD Inpatient   01/16/2018 1515 01/18/2018 1635 Full Code [id number]  Dustin Flock, MD ED   11/23/2017 1346 11/28/2017 2020 Full Code [id number]  Nicholes Mango, MD Inpatient   10/26/2017 1534 10/28/2017 1854 Full Code [id number]  Bettey Costa, MD Inpatient   10/26/2017 1437 10/26/2017 1534 Full Code [id number]  Feliberto Gottron, RN Inpatient   10/24/2017 1152 10/26/2017 1437 DNR [id number]  Bettey Costa, MD Inpatient   10/15/2017 0309 10/24/2017 1152 Full Code [id number]  Arta Silence, MD Inpatient   02/10/2017 1904 02/12/2017 1908 Full Code [id number]  Schnier, Dolores Lory, MD ED   02/10/2017 1638 02/10/2017 1904 Full Code [id number]  Sela Hua, PA-C Outpatient   01/08/2017 1342 01/19/2017 2103 Full Code [id number]  Loletha Grayer, MD ED   12/29/2016 2232 12/30/2016 1634 Full Code [id number]  Lance Coon, MD ED   12/20/2016 1251 12/20/2016 1842 Full Code [id number]  Algernon Huxley, MD Inpatient   10/22/2016 0040 10/24/2016 2058 Full Code [id number]  Lance Coon, MD Inpatient   10/14/2016 1201 10/14/2016 1925 Full Code [id number]  Algernon Huxley, MD Inpatient   08/30/2016 1338 09/06/2016 1941 Full Code [id number]  Epifanio Lesches, MD Inpatient    05/19/2016 0454 05/22/2016 1907 Full Code [id number]  Saundra Shelling, MD Inpatient      TOTAL TIME TAKING CARE OF THIS PATIENT: *40 minutes.    [name] M.D on 10/18/2018 at 7:33 AM  Between 7am to 6pm - Pager - [phone] After 6pm go to [email or web address] - [place]  [facility]  Office  [phone]  CC: Primary care physician; Jesse Kayser, MD

## 2018-10-31 ENCOUNTER — Encounter (INDEPENDENT_AMBULATORY_CARE_PROVIDER_SITE_OTHER): Payer: Medicare Other

## 2018-10-31 ENCOUNTER — Ambulatory Visit (INDEPENDENT_AMBULATORY_CARE_PROVIDER_SITE_OTHER): Payer: Medicare Other | Admitting: Nurse Practitioner

## 2018-12-12 ENCOUNTER — Ambulatory Visit: Payer: Medicare Other | Admitting: Physician Assistant

## 2018-12-14 ENCOUNTER — Encounter: Payer: Medicare Other | Attending: Physician Assistant | Admitting: Physician Assistant

## 2018-12-14 ENCOUNTER — Ambulatory Visit
Admission: RE | Admit: 2018-12-14 | Discharge: 2018-12-14 | Disposition: A | Discharge status: Home / Self Care | Payer: Medicare Other | Source: Ambulatory Visit | Attending: Physician Assistant | Admitting: Physician Assistant

## 2018-12-14 ENCOUNTER — Other Ambulatory Visit: Payer: Self-pay | Admitting: Physician Assistant

## 2018-12-14 ENCOUNTER — Other Ambulatory Visit: Payer: Self-pay

## 2018-12-14 DIAGNOSIS — B999 Unspecified infectious disease: Secondary | ICD-10-CM | POA: Insufficient documentation

## 2018-12-14 DIAGNOSIS — Z96641 Presence of right artificial hip joint: Secondary | ICD-10-CM | POA: Insufficient documentation

## 2018-12-14 DIAGNOSIS — E1140 Type 2 diabetes mellitus with diabetic neuropathy, unspecified: Secondary | ICD-10-CM | POA: Diagnosis not present

## 2018-12-14 DIAGNOSIS — E11621 Type 2 diabetes mellitus with foot ulcer: Secondary | ICD-10-CM | POA: Insufficient documentation

## 2018-12-14 DIAGNOSIS — I480 Paroxysmal atrial fibrillation: Secondary | ICD-10-CM | POA: Diagnosis not present

## 2018-12-14 DIAGNOSIS — G4730 Sleep apnea, unspecified: Secondary | ICD-10-CM | POA: Diagnosis not present

## 2018-12-14 DIAGNOSIS — I5042 Chronic combined systolic (congestive) and diastolic (congestive) heart failure: Secondary | ICD-10-CM | POA: Insufficient documentation

## 2018-12-14 DIAGNOSIS — L97312 Non-pressure chronic ulcer of right ankle with fat layer exposed: Secondary | ICD-10-CM | POA: Insufficient documentation

## 2018-12-14 DIAGNOSIS — E1151 Type 2 diabetes mellitus with diabetic peripheral angiopathy without gangrene: Secondary | ICD-10-CM | POA: Insufficient documentation

## 2018-12-14 DIAGNOSIS — Z87891 Personal history of nicotine dependence: Secondary | ICD-10-CM | POA: Diagnosis not present

## 2018-12-14 DIAGNOSIS — Z794 Long term (current) use of insulin: Secondary | ICD-10-CM | POA: Insufficient documentation

## 2018-12-14 DIAGNOSIS — Z89512 Acquired absence of left leg below knee: Secondary | ICD-10-CM | POA: Diagnosis not present

## 2018-12-14 DIAGNOSIS — E11622 Type 2 diabetes mellitus with other skin ulcer: Secondary | ICD-10-CM | POA: Diagnosis present

## 2018-12-14 DIAGNOSIS — L97412 Non-pressure chronic ulcer of right heel and midfoot with fat layer exposed: Secondary | ICD-10-CM | POA: Diagnosis not present

## 2018-12-14 DIAGNOSIS — I110 Hypertensive heart disease with heart failure: Secondary | ICD-10-CM | POA: Diagnosis not present

## 2018-12-14 DIAGNOSIS — L97811 Non-pressure chronic ulcer of other part of right lower leg limited to breakdown of skin: Secondary | ICD-10-CM | POA: Diagnosis not present

## 2018-12-14 NOTE — Progress Notes (Signed)
MARCHELLO, ROTHGEB ([id number]) Visit Report for 12/14/2018 Abuse/Suicide Risk Screen Details Patient Name: [name], [name] Date of Service: 12/14/2018 9:45 AM Medical Record Number: [id number] Patient Account Number: [id number] Date of Birth/Sex: May 22, 1935 (84 y.o. M) Treating RN: Harold Barban Primary Care Joretta Eads: Serafina Royals Other Clinician: Referring Jonne Rote: Referral, Self Treating Chase Arnall/Extender: STONE III, HOYT Weeks in Treatment: 0 Abuse/Suicide Risk Screen Items Answer ABUSE RISK SCREEN: Has anyone close to you tried to hurt or harm you recentlyo No Do you feel uncomfortable with anyone in your familyo No Has anyone forced you do things that you didnot want to doo No Electronic Signature(s) Signed: 12/14/2018 3:44:22 PM By: Harold Barban Entered By: Harold Barban on 12/14/2018 09:59:18 Krasowski, Hezikiah A. ([id number]) -------------------------------------------------------------------------------- Activities of Daily Living Details Patient Name: Jesse Floor A. Date of Service: 12/14/2018 9:45 AM Medical Record Number: [id number] Patient Account Number: [id number] Date of Birth/Sex: 1935-03-09 (84 y.o. M) Treating RN: Harold Barban Primary Care Tavious Griesinger: Serafina Royals Other Clinician: Referring Keyaira Clapham: Referral, Self Treating Latissa Frick/Extender: STONE III, HOYT Weeks in Treatment: 0 Activities of Daily Living Items Answer Activities of Daily Living (Please select one for each item) Drive Automobile Not Able Take Medications Completely Able Use Telephone Completely Able Care for Appearance Completely Able Use Toilet Completely Able Bath / Shower Completely Able Dress Self Completely Able Feed Self Completely Able Walk Not Able Get In / Out Bed Completely [place] Need Assistance Shop for Self Need Assistance Electronic Signature(s) Signed: 12/14/2018 3:44:22 PM By: Harold Barban Entered By: Harold Barban on 12/14/2018 10:00:03 [name] ([id number]) -------------------------------------------------------------------------------- Education Screening Details Patient Name: Jesse Floor A. Date of Service: 12/14/2018 9:45 AM Medical Record Number: [id number] Patient Account Number: [id number] Date of Birth/Sex: January 27, 1935 (84 y.o. M) Treating RN: Harold Barban Primary Care Dynasti Kerman: Serafina Royals Other Clinician: Referring Belkys Henault: Referral, Self Treating Jevonte Clanton/Extender: Melburn Hake, HOYT Weeks in Treatment: 0 Primary Learner Assessed: Patient Learning Preferences/Education Level/Primary Language Learning Preference: Explanation Highest Education Level: College or Above Preferred Language: English Cognitive Barrier Language Barrier: No Translator Needed: No Memory Deficit: No Emotional Barrier: No Cultural/Religious Beliefs Affecting Medical Care: No Physical Barrier Impaired Vision: No Impaired Hearing: No Decreased Hand dexterity: No Knowledge/Comprehension Knowledge Level: High Comprehension Level: High Ability to understand written High instructions: Ability to understand verbal High instructions: Motivation Anxiety Level: Calm Cooperation: Cooperative Education Importance: Acknowledges Need Interest in Health Problems: Asks Questions Perception: Coherent Willingness to Engage in Self- High Management Activities: Readiness to Engage in Self- High Management Activities: Electronic Signature(s) Signed: 12/14/2018 3:44:22 PM By: Harold Barban Entered By: Harold Barban on 12/14/2018 10:00:26 [name] ([id number]) -------------------------------------------------------------------------------- Fall Risk Assessment Details Patient Name: Jesse Floor A. Date of Service: 12/14/2018 9:45 AM Medical Record Number: [id number] Patient Account Number: [id number] Date of Birth/Sex: [birth year] (84 y.o.  M) Treating RN: Harold Barban Primary Care Alisia Vanengen: Serafina Royals Other Clinician: Referring Cassie Shedlock: Referral, Self Treating Jhonathan Desroches/Extender: Melburn Hake, HOYT Weeks in Treatment: 0 Fall Risk Assessment Items Have you had 2 or more falls in the last 12 monthso 0 Yes Have you had any fall that resulted in injury in the last 12 monthso 0 Yes FALLS RISK SCREEN History of falling - immediate or within 3 months 25 Yes Secondary diagnosis (Do you have 2 or more medical diagnoseso) 0 No Ambulatory aid None/bed rest/wheelchair/nurse 0 Yes Crutches/cane/walker 0 No Furniture 0 No Intravenous therapy Access/Saline/Heparin Lock 0 No Gait/Transferring Normal/ bed rest/ wheelchair 0 No Weak (  short steps with or without shuffle, stooped but able to lift head while 10 Yes walking, may seek support from furniture) Impaired (short steps with shuffle, may have difficulty arising from chair, head 20 Yes down, impaired balance) Mental Status Oriented to own ability 0 Yes Electronic Signature(s) Signed: 12/14/2018 3:44:22 PM By: Harold Barban Entered By: Harold Barban on 12/14/2018 10:01:24 [name] ([id number]) -------------------------------------------------------------------------------- Foot Assessment Details Patient Name: Jesse Floor A. Date of Service: 12/14/2018 9:45 AM Medical Record Number: [id number] Patient Account Number: [id number] Date of Birth/Sex: Jun 01, 1934 (84 y.o. M) Treating RN: Harold Barban Primary Care Jaynee Winters: Serafina Royals Other Clinician: Referring Envy Meno: Referral, Self Treating Justyce Baby/Extender: STONE III, HOYT Weeks in Treatment: 0 Foot Assessment Items Site Locations + = Sensation present, - = Sensation absent, C = Callus, U = Ulcer R = Redness, W = Warmth, M = Maceration, PU = Pre-ulcerative lesion F = Fissure, S = Swelling, D = Dryness Assessment Right: Left: Other Deformity: No No Prior Foot Ulcer: No No Prior Amputation:  No No Charcot Joint: No No Ambulatory Status: Non-ambulatory Assistance Device: Wheelchair Gait: Administrator, arts) Signed: 12/14/2018 3:44:22 PM By: Harold Barban Entered By: Harold Barban on 12/14/2018 10:02:47 [name] ([id number]) -------------------------------------------------------------------------------- Nutrition Risk Screening Details Patient Name: Jesse Floor A. Date of Service: 12/14/2018 9:45 AM Medical Record Number: [id number] Patient Account Number: [id number] Date of Birth/Sex: 1934-11-19 (84 y.o. M) Treating RN: Harold Barban Primary Care Carmela Piechowski: Serafina Royals Other Clinician: Referring Gizell Danser: Referral, Self Treating Wrangler Penning/Extender: STONE III, HOYT Weeks in Treatment: 0 Height (in): 67 Weight (lbs): 180 Body Mass Index (BMI): 28.2 Nutrition Risk Screening Items Score Screening NUTRITION RISK SCREEN: I have an illness or condition that made me change the kind and/or amount of 0 No food I eat I eat fewer than two meals per day 0 No I eat few fruits and vegetables, or milk products 0 No I have three or more drinks of beer, liquor or wine almost every day 0 No I have tooth or mouth problems that make it hard for me to eat 0 No I don't always have enough money to buy the food I need 0 No I eat alone most of the time 0 No I take three or more different prescribed or over-the-counter drugs a day 1 Yes Without wanting to, I have lost or gained 10 pounds in the last six months 0 No I am not always physically able to shop, cook and/or feed myself 0 No Nutrition Protocols Good Risk Protocol Moderate Risk Protocol High Risk Proctocol Risk Level: Good Risk Score: 1 Electronic Signature(s) Signed: 12/14/2018 3:44:22 PM By: Harold Barban Entered By: Harold Barban on 12/14/2018 10:01:47

## 2018-12-15 ENCOUNTER — Encounter
Admission: RE | Admit: 2018-12-15 | Discharge: 2018-12-15 | Disposition: A | Discharge status: Home / Self Care | Payer: Medicare Other | Source: Ambulatory Visit | Attending: Vascular Surgery | Admitting: Vascular Surgery

## 2018-12-15 ENCOUNTER — Encounter (INDEPENDENT_AMBULATORY_CARE_PROVIDER_SITE_OTHER): Payer: Self-pay

## 2018-12-15 ENCOUNTER — Telehealth (INDEPENDENT_AMBULATORY_CARE_PROVIDER_SITE_OTHER): Payer: Self-pay

## 2018-12-15 ENCOUNTER — Ambulatory Visit (INDEPENDENT_AMBULATORY_CARE_PROVIDER_SITE_OTHER): Payer: Medicare Other

## 2018-12-15 ENCOUNTER — Other Ambulatory Visit (INDEPENDENT_AMBULATORY_CARE_PROVIDER_SITE_OTHER): Payer: Self-pay

## 2018-12-15 ENCOUNTER — Encounter (INDEPENDENT_AMBULATORY_CARE_PROVIDER_SITE_OTHER): Payer: Self-pay | Admitting: Vascular Surgery

## 2018-12-15 ENCOUNTER — Ambulatory Visit (INDEPENDENT_AMBULATORY_CARE_PROVIDER_SITE_OTHER): Payer: Medicare Other | Admitting: Vascular Surgery

## 2018-12-15 ENCOUNTER — Other Ambulatory Visit: Payer: Self-pay

## 2018-12-15 VITALS — BP 144/72 | HR 69 | Resp 12 | Ht 67.0 in | Wt 180.0 lb

## 2018-12-15 DIAGNOSIS — I4891 Unspecified atrial fibrillation: Secondary | ICD-10-CM

## 2018-12-15 DIAGNOSIS — L97519 Non-pressure chronic ulcer of other part of right foot with unspecified severity: Secondary | ICD-10-CM

## 2018-12-15 DIAGNOSIS — Z79899 Other long term (current) drug therapy: Secondary | ICD-10-CM

## 2018-12-15 DIAGNOSIS — L97919 Non-pressure chronic ulcer of unspecified part of right lower leg with unspecified severity: Secondary | ICD-10-CM

## 2018-12-15 DIAGNOSIS — Z87891 Personal history of nicotine dependence: Secondary | ICD-10-CM

## 2018-12-15 DIAGNOSIS — Z7901 Long term (current) use of anticoagulants: Secondary | ICD-10-CM

## 2018-12-15 DIAGNOSIS — I7025 Atherosclerosis of native arteries of other extremities with ulceration: Secondary | ICD-10-CM

## 2018-12-15 DIAGNOSIS — Z89512 Acquired absence of left leg below knee: Secondary | ICD-10-CM

## 2018-12-15 DIAGNOSIS — I739 Peripheral vascular disease, unspecified: Secondary | ICD-10-CM | POA: Diagnosis not present

## 2018-12-15 DIAGNOSIS — E1151 Type 2 diabetes mellitus with diabetic peripheral angiopathy without gangrene: Secondary | ICD-10-CM

## 2018-12-15 DIAGNOSIS — L97411 Non-pressure chronic ulcer of right heel and midfoot limited to breakdown of skin: Secondary | ICD-10-CM

## 2018-12-15 DIAGNOSIS — Z794 Long term (current) use of insulin: Secondary | ICD-10-CM

## 2018-12-15 DIAGNOSIS — I10 Essential (primary) hypertension: Secondary | ICD-10-CM

## 2018-12-15 DIAGNOSIS — Z1159 Encounter for screening for other viral diseases: Secondary | ICD-10-CM | POA: Insufficient documentation

## 2018-12-15 DIAGNOSIS — I70234 Atherosclerosis of native arteries of right leg with ulceration of heel and midfoot: Secondary | ICD-10-CM | POA: Diagnosis not present

## 2018-12-15 DIAGNOSIS — E119 Type 2 diabetes mellitus without complications: Secondary | ICD-10-CM

## 2018-12-15 DIAGNOSIS — E785 Hyperlipidemia, unspecified: Secondary | ICD-10-CM

## 2018-12-15 NOTE — Progress Notes (Signed)
MRN : [id number]  [name] is a 84 y.o. (04-Apr-1935) male who presents with chief complaint of  Chief Complaint  Patient presents with   Follow-up  .  History of Present Illness: Patient returns today in follow up prior to his scheduled visit due to new ulcerations on the right lower extremity.  I saw him about 4 months ago at which time he was doing quite well.  He was walking with his prosthesis and had no active ulcerations on the right leg.  Apparently, the patient had a prolonged illness about 2 months ago where he was bedbound for several days.  These ulcerations developed after this and have not been improving.  He has been seen by the wound care center who referred him back for further evaluation and treatment.  These are extremely painful and he says he is in 10 out of 10 pain all the time.  Nothing is really help this.  No fevers or chills. The heel ulceration is particularly worrisome and there is about a 6 to 7 cm oval-shaped ulceration with necrotic eschar present.  Although his ABIs today were 1.2 on that right side this was likely noncompressible as his waveforms are monophasic indicative of significant vascular disease.  He has known significant disease on that side and we had previously planned an angiogram in the past that had to be delayed and eventually canceled due to cardiac issues.  Current Outpatient Medications  Medication Sig Dispense Refill   amiodarone (PACERONE) 200 MG tablet Take 200 mg by mouth daily.     atorvastatin (LIPITOR) 80 MG tablet Take 80 mg by mouth every evening.     diltiazem (CARDIZEM CD) 120 MG 24 hr capsule Take 120 mg by mouth daily.     HYDROcodone-acetaminophen (NORCO/VICODIN) 5-325 MG tablet Take 1-2 tablets by mouth every 6 (six) hours as needed for moderate pain (pain score 4-6). 30 tablet 0   insulin glargine (LANTUS) 100 UNIT/ML injection Inject 0.15 mLs (15 Units total) into the skin at bedtime. 1 vial 0   insulin  lispro (HUMALOG) 100 UNIT/ML injection Inject 0.03 mLs (3 Units total) into the skin 3 (three) times daily with meals. (Patient taking differently: Inject 3 Units into the skin 3 (three) times daily with meals. Plus 1 additional unit for every 50 BG points over 200 (max 5 additional units per dose)) 10 mL 0   methocarbamol (ROBAXIN) 500 MG tablet Take 1 tablet (500 mg total) by mouth every 6 (six) hours as needed for muscle spasms. 15 tablet 0   metoprolol tartrate (LOPRESSOR) 25 MG tablet Take 1 tablet (25 mg total) by mouth 3 (three) times daily. 90 tablet 0   ferrous sulfate 325 (65 FE) MG tablet Take 1 tablet (325 mg total) by mouth daily with breakfast. 30 tablet 3   nitroGLYCERIN (NITROSTAT) 0.4 MG SL tablet Place 1 tablet (0.4 mg total) under the tongue every 5 (five) minutes as needed for chest pain. 20 tablet 0   Rivaroxaban (XARELTO) 15 MG TABS tablet Take 1 tablet (15 mg total) by mouth daily with supper. 30 tablet 1   No current facility-administered medications for this visit.     Past Medical History:  Diagnosis Date   Arthritis    Atrial fibrillation ([place])    Carcinoma of prostate ([place])    CHF (congestive heart failure) ([place])    Chronic kidney disease    Diabetes mellitus without complication [facility])    ED (erectile dysfunction)  Frequent urination    Hyperlipidemia    Hypertension    Moderate mitral insufficiency    Peripheral vascular disease ([place])    Pneumonia 04/2016   Prostate cancer ([place])    Sleep apnea    OSA--USE C-PAP   Ulcer of left foot due to type 2 diabetes mellitus ([place])    Urinary stress incontinence, male     Past Surgical History:  Procedure Laterality Date   AMPUTATION Left 01/12/2017   Procedure: AMPUTATION BELOW KNEE;  Surgeon: Algernon Huxley, MD;  Location: [facility] ORS;  Service: General;  Laterality: Left;   APLIGRAFT PLACEMENT Left 10/15/2016   Procedure: APLIGRAFT PLACEMENT;  Surgeon: Albertine Patricia, DPM;  Location: [facility]  ORS;  Service: Podiatry;  Laterality: Left;  necrotic ulcer   CHOLECYSTECTOMY     EYE SURGERY Bilateral    Cataract Extraction with IOL   HIP ARTHROPLASTY Right 10/13/2018   Procedure: ARTHROPLASTY BIPOLAR HIP (HEMIARTHROPLASTY);  Surgeon: Earnestine Leys, MD;  Location: [facility] ORS;  Service: Orthopedics;  Laterality: Right;   IRRIGATION AND DEBRIDEMENT FOOT Left 10/15/2016   Procedure: IRRIGATION AND DEBRIDEMENT FOOT-EXCISIONAL DEBRIDEMENT OF SKIN 3RD, 4TH AND 5TH TOES WITH APPLICATION OF APLIGRAFT;  Surgeon: Albertine Patricia, DPM;  Location: [facility] ORS;  Service: Podiatry;  Laterality: Left;  necrotic,gangrene   IRRIGATION AND DEBRIDEMENT FOOT Left 12/09/2016   Procedure: IRRIGATION AND DEBRIDEMENT FOOT-MUSCLE/FASCIA, RESECTION OF FOURTH AND FIFTH METATARSAL NECROTIC BONE;  Surgeon: Albertine Patricia, DPM;  Location: [facility] ORS;  Service: Podiatry;  Laterality: Left;   LEFT HEART CATH AND CORONARY ANGIOGRAPHY N/A 06/05/2018   Procedure: LEFT HEART CATH AND CORONARY ANGIOGRAPHY;  Surgeon: Yolonda Kida, MD;  Location: [place] CV LAB;  Service: Cardiovascular;  Laterality: N/A;   LOWER EXTREMITY ANGIOGRAPHY Left 08/16/2016   Procedure: Lower Extremity Angiography;  Surgeon: Algernon Huxley, MD;  Location: [place] [facility] LAB;  Service: Cardiovascular;  Laterality: Left;   LOWER EXTREMITY ANGIOGRAPHY Left 09/01/2016   Procedure: Lower Extremity Angiography;  Surgeon: Algernon Huxley, MD;  Location: [place] [facility] LAB;  Service: Cardiovascular;  Laterality: Left;   LOWER EXTREMITY ANGIOGRAPHY Left 10/14/2016   Procedure: Lower Extremity Angiography;  Surgeon: Algernon Huxley, MD;  Location: [place] CV LAB;  Service: Cardiovascular;  Laterality: Left;   LOWER EXTREMITY ANGIOGRAPHY Left 12/20/2016   Procedure: Lower Extremity Angiography;  Surgeon: Algernon Huxley, MD;  Location: [place] [facility] LAB;  Service: Cardiovascular;  Laterality: Left;   LOWER EXTREMITY INTERVENTION  09/01/2016    Procedure: Lower Extremity Intervention;  Surgeon: Algernon Huxley, MD;  Location: [place] [facility] LAB;  Service: Cardiovascular;;   PROSTATE SURGERY     removal   TONSILLECTOMY     as a child    Social History Social History   Tobacco Use   Smoking status: Former Smoker    Packs/day: 0.25    Types: Cigarettes    Quit date: 10/14/1994    Years since quitting: 24.1   Smokeless tobacco: Never Used  Substance Use Topics   Alcohol use: No    Alcohol/week: 0.0 standard drinks   Drug use: No    Family History Family History  Problem Relation Age of Onset   Hypertension Mother    Diabetes Mother    Prostate cancer Neg Hx    Bladder Cancer Neg Hx    Kidney disease Neg Hx   no bleeding or clotting disorders  Allergies  Allergen Reactions   Contrast Media [Iodinated Diagnostic Agents] Shortness Of Breath  SOB   Iohexol Shortness Of Breath     Desc: Respiratory Distress, Laryngedema, diaphoresis, Onset Date: [id number]    Metrizamide Shortness Of Breath    SOB SOB SOB    Latex Rash   REVIEW OF SYSTEMS(Negative unless checked)  Constitutional: [] ???Weight loss[] ???Fever[] ???Chills Cardiac:[] ???Chest pain[] ???Chest pressure[x] ???Palpitations [] ???Shortness of breath when laying flat [] ???Shortness of breath at rest [x] ???Shortness of breath with exertion. Vascular: [x] ???Pain in legs with walking[] ???Pain in legsat rest[] ???Pain in legs when laying flat [] ???Claudication [x] ???Pain in feet when walking [x] ???Pain in feet at rest [x] ???Pain in feet when laying flat [] ???History of DVT [] ???Phlebitis [x] ???Swelling in legs [] ???Varicose veins [x] ???Non-healing ulcers Pulmonary: [] ???Uses home oxygen [x] ???Productive cough[] ???Hemoptysis [] ???Wheeze [] ???COPD [] ???Asthma Neurologic: [] ???Dizziness [] ???Blackouts [] ???Seizures [] ???History of stroke [] ???History of TIA[] ???Aphasia [] ???Temporary  blindness[] ???Dysphagia [] ???Weaknessor numbness in arms [] ???Weakness or numbnessin legs Musculoskeletal: [x] ???Arthritis [] ???Joint swelling [] ???Joint pain [] ???Low back pain Hematologic:[] ???Easy bruising[] ???Easy bleeding [] ???Hypercoagulable state [] ???Anemic [] ???Hepatitis Gastrointestinal:[] ???Blood in stool[] ???Vomiting blood[] ???Gastroesophageal reflux/heartburn[] ???Abdominal pain Genitourinary: [] ???Chronic kidney disease [] ???Difficulturination [] ???Frequenturination [] ???Burning with urination[] ???Hematuria Skin: [] ???Rashes [x] ???Ulcers [x] ???Wounds Psychological: [] ???History of anxiety[] ???History of major depression.   Physical Examination  BP (!) 144/72 (BP Location: Left Arm, Patient Position: Sitting, Cuff Size: Normal)    Pulse 69    Resp 12    Ht 5\' 7"  (1.702 m)    Wt 180 lb (81.6 kg)    BMI 28.19 kg/m  Gen:  WD/WN, NAD Head: [place]/AT, No temporalis wasting. Ear/Nose/Throat: Hearing grossly intact, nares w/o erythema or drainage Eyes: Conjunctiva clear. Sclera non-icteric Neck: Supple.  Trachea midline Pulmonary:  Good air movement, no use of accessory muscles.  Cardiac: Irregular Vascular:  Vessel Right Left  Radial Palpable Palpable                          PT  not palpable  not palpable  DP  1+ palpable  not palpable   Gastrointestinal: soft, non-tender/non-distended.  Musculoskeletal: M/S 5/5 throughout.  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  1+ right lower extremity edema.  Extensive ulceration of the right heel measuring about 7 cm in all shape.  There is also a small ulceration on the medial lower leg in the medial and.  Mild surrounding erythema. Neurologic: Sensation grossly intact in extremities.  Symmetrical.  Speech is fluent.  Psychiatric: Judgment and insight are fair at best.  He definitely has some underlying cognitive issues.  His son and his wife are also on the phone and  involved with the evaluation Dermatologic: Right foot and lower leg as above       Labs Recent Results (from the past 2160 hour(s))  CBC     Status: Abnormal   Collection Time: 10/13/18  8:36 AM  Result Value Ref Range   WBC 13.6 (H) 4.0 - 10.5 K/uL   RBC 5.04 4.22 - 5.81 MIL/uL   Hemoglobin 15.0 13.0 - 17.0 g/dL   HCT 43.6 39.0 - 52.0 %   MCV 86.5 80.0 - 100.0 fL   MCH 29.8 26.0 - 34.0 pg   MCHC 34.4 30.0 - 36.0 g/dL   RDW 14.6 11.5 - 15.5 %   Platelets 223 150 - 400 K/uL   nRBC 0.0 0.0 - 0.2 %    Comment: Performed at [facility], [address], [place], [place] [address]  Basic metabolic panel     Status: Abnormal   Collection Time: 10/13/18  8:36 AM  Result Value Ref Range   Sodium 137 135 - 145 mmol/L   Potassium  4.2 3.5 - 5.1 mmol/L   Chloride 102 98 - 111 mmol/L   CO2 24 22 - 32 mmol/L   Glucose, Bld 242 (H) 70 - 99 mg/dL   BUN 38 (H) 8 - 23 mg/dL   Creatinine, Ser 2.03 (H) 0.61 - 1.24 mg/dL   Calcium 8.9 8.9 - 10.3 mg/dL   GFR calc non Af Amer 30 (L) >60 mL/min   GFR calc Af Amer 35 (L) >60 mL/min   Anion gap 11 5 - 15    Comment: Performed at [facility], [address] [place], [place] [address]  Protime-INR     Status: None   Collection Time: 10/13/18  8:36 AM  Result Value Ref Range   Prothrombin Time 14.9 11.4 - 15.2 seconds   INR 1.2 0.8 - 1.2    Comment: (NOTE) INR goal varies based on device and disease states. Performed at [facility], [address] [place], [place] [address]   APTT     Status: None   Collection Time: 10/13/18  8:36 AM  Result Value Ref Range   aPTT 26 24 - 36 seconds    Comment: Performed at [facility], [address] [place], [place] [address]  Type and screen     Status: None   Collection Time: 10/13/18  8:36 AM  Result Value Ref Range   ABO/RH(D) O POS    Antibody Screen NEG    Sample Expiration      10/16/2018,2359 Performed at [facility], [address], [place], [place] [address]   SARS Coronavirus 2 (CEPHEID - Performed in [place] hospital lab), Hosp Order     Status: None   Collection Time: 10/13/18  9:32 AM   Specimen: Nasopharyngeal Swab  Result Value Ref Range   SARS Coronavirus 2 NEGATIVE NEGATIVE    Comment: (NOTE) If result is NEGATIVE SARS-CoV-2 target nucleic acids are NOT DETECTED. The SARS-CoV-2 RNA is generally detectable in upper and lower  respiratory specimens during the acute phase of infection. The lowest  concentration of SARS-CoV-2 viral copies this assay can detect is 250  copies / mL. A negative result does not preclude SARS-CoV-2 infection  and should not be used as the sole basis for treatment or other  patient management decisions.  A negative result may occur with  improper specimen collection / handling, submission of specimen other  than nasopharyngeal swab, presence of viral mutation(s) within the  areas targeted by this assay, and inadequate number of viral copies  (<250 copies / mL). A negative result must be combined with clinical  observations, patient history, and epidemiological information. If result is POSITIVE SARS-CoV-2 target nucleic acids are DETECTED. The SARS-CoV-2 RNA is generally detectable in upper and lower  respiratory specimens dur ing the acute phase of infection.  Positive  results are indicative of active infection with SARS-CoV-2.  Clinical  correlation with patient history and other diagnostic information is  necessary to determine patient infection status.  Positive results do  not rule out bacterial infection or co-infection with other viruses. If result is PRESUMPTIVE POSTIVE SARS-CoV-2 nucleic acids MAY BE PRESENT.   A presumptive positive result was obtained on the submitted specimen  and confirmed on repeat testing.  While 2019 novel coronavirus  (SARS-CoV-2) nucleic acids may be present in the submitted sample  additional confirmatory testing may be necessary for  epidemiological  and / or clinical management purposes  to differentiate between  SARS-CoV-2 and other Sarbecovirus  currently known to infect humans.  If clinically indicated additional testing with an alternate test  methodology [phone]) is advised. The SARS-CoV-2 RNA is generally  detectable in upper and lower respiratory sp ecimens during the acute  phase of infection. The expected result is Negative. Fact Sheet for Patients:  StrictlyIdeas.no Fact Sheet for Healthcare Providers: BankingDealers.co.za This test is not yet approved or cleared by the [place] FDA and has been authorized for detection and/or diagnosis of SARS-CoV-2 by FDA under an Emergency Use Authorization (EUA).  This EUA will remain in effect (meaning this test can be used) for the duration of the COVID-19 declaration under Section 564(b)(1) of the Act, 21 U.S.C. section 360bbb-3(b)(1), unless the authorization is terminated or revoked sooner. Performed at [facility], [address] [place], [place] [address]   Urinalysis, Routine w reflex microscopic     Status: Abnormal   Collection Time: 10/13/18 12:05 PM  Result Value Ref Range   Color, Urine YELLOW (A) YELLOW   APPearance CLEAR (A) CLEAR   Specific Gravity, Urine 1.016 1.005 - 1.030   pH 6.0 5.0 - 8.0   Glucose, UA >=500 (A) NEGATIVE mg/dL   Hgb urine dipstick NEGATIVE NEGATIVE   Bilirubin Urine NEGATIVE NEGATIVE   Ketones, ur NEGATIVE NEGATIVE mg/dL   Protein, ur NEGATIVE NEGATIVE mg/dL   Nitrite NEGATIVE NEGATIVE   Leukocytes,Ua NEGATIVE NEGATIVE   RBC / HPF 0-5 0 - 5 RBC/hpf   WBC, UA 0-5 0 - 5 WBC/hpf   Bacteria, UA NONE SEEN NONE SEEN   Squamous Epithelial / LPF 0-5 0 - 5    Comment: Performed at [facility], [address] [place], [place] [address]  Surgical PCR screen     Status: Abnormal   Collection Time: 10/13/18 12:05 PM   Specimen: Nasal Mucosa; Nasal Swab    Result Value Ref Range   MRSA, PCR NEGATIVE NEGATIVE   Staphylococcus aureus POSITIVE (A) NEGATIVE    Comment: (NOTE) The Xpert SA Assay (FDA approved for NASAL specimens in patients 57 years of age and older), is one component of a comprehensive surveillance program. It is not intended to diagnose infection nor to guide or monitor treatment. Performed at [facility], [address] [place], [place] [address]   Glucose, capillary     Status: Abnormal   Collection Time: 10/13/18 12:51 PM  Result Value Ref Range   Glucose-Capillary 255 (H) 70 - 99 mg/dL  Glucose, capillary     Status: Abnormal   Collection Time: 10/13/18  2:56 PM  Result Value Ref Range   Glucose-Capillary 165 (H) 70 - 99 mg/dL  Surgical pathology     Status: None   Collection Time: 10/13/18  5:29 PM  Result Value Ref Range   SURGICAL PATHOLOGY      Surgical Pathology CASE: [id number] PATIENT: Janelle Floor Surgical Pathology Report     SPECIMEN SUBMITTED: A. Femoral head, right  CLINICAL HISTORY: None provided  PRE-OPERATIVE DIAGNOSIS: Right femoral neck fracture  POST-OPERATIVE DIAGNOSIS: Same as pre-op     DIAGNOSIS: A.  FEMORAL HEAD, RIGHT; HEMIARTHROPLASTY: - BONE AND MARROW STROMA WITH HEMORRHAGE AND CHANGES CONSISTENT WITH PROVIDED HISTORY OF FRACTURE. - OSTEOARTHROSIS. - SYNOVIUM WITH REACTIVE CHANGES. - TRILINEAGE HEMATOPOIESIS.  GROSS DESCRIPTION: A. Labeled: Right femoral head Received: Formalin Size of specimen:      Head - 4.5 x 4.5 x 3.5 cm      Neck - 3.3 x 1.7 x 1.5 cm  Additional tissue: Absent Articular surface: The articular surface displays a 3.5 x 1.2 cm focal, irregularly indented defect with minimal surrounding granulation.  The remaining articular surface is tan-yellow and smooth. Cut surface: The bone parenchyma underlying the surgical resection margin is r ed-brown and hemorrhagic.  The remaining bone parenchyma is tan-yellow and otherwise  grossly unremarkable. Other findings: The surgical resection margin is red-brown, jagged, and hemorrhagic (suspicious for fracture site).  Block summary: 1-2 - hemorrhagic bone parenchyma at surgical resection margin (suspicious for fracture site) 3 - articular surface abnormal defect   Final Diagnosis performed by Quay Burow, MD.   Electronically signed 10/18/2018 9:29:51AM The electronic signature indicates that the named Attending Pathologist has evaluated the specimen  Technical component performed at [facility], [address], [place], [place] [address] Lab: [phone] Dir: Rush Farmer, MD, MMM  Professional component performed at [facility], [facility], [address], [place], [place] [address] Lab: [phone] Dir: Dellia Nims. Rubinas, MD   Glucose, capillary     Status: Abnormal   Collection Time: 10/13/18  6:21 PM  Result Value Ref Range   Glucose-Capillary 140 (H) 70 - 99 mg/dL  Glucose, capillary     Status: Abnormal   Collection Time: 10/13/18 10:02 PM  Result Value Ref Range   Glucose-Capillary 135 (H) 70 - 99 mg/dL  CBC     Status: Abnormal   Collection Time: 10/14/18  6:42 AM  Result Value Ref Range   WBC 12.7 (H) 4.0 - 10.5 K/uL   RBC 4.34 4.22 - 5.81 MIL/uL   Hemoglobin 12.7 (L) 13.0 - 17.0 g/dL   HCT 38.1 (L) 39.0 - 52.0 %   MCV 87.8 80.0 - 100.0 fL   MCH 29.3 26.0 - 34.0 pg   MCHC 33.3 30.0 - 36.0 g/dL   RDW 14.9 11.5 - 15.5 %   Platelets 173 150 - 400 K/uL   nRBC 0.0 0.0 - 0.2 %    Comment: Performed at [facility], [address] [place], [place] [address]  Basic metabolic panel     Status: Abnormal   Collection Time: 10/14/18  6:42 AM  Result Value Ref Range   Sodium 139 135 - 145 mmol/L   Potassium 4.5 3.5 - 5.1 mmol/L   Chloride 108 98 - 111 mmol/L   CO2 22 22 - 32 mmol/L   Glucose, Bld 128 (H) 70 - 99 mg/dL   BUN 31 (H) 8 - 23 mg/dL   Creatinine, Ser 1.62 (H) 0.61 - 1.24 mg/dL   Calcium 8.2 (L) 8.9 - 10.3 mg/dL     GFR calc non Af Amer 39 (L) >60 mL/min   GFR calc Af Amer 45 (L) >60 mL/min   Anion gap 9 5 - 15    Comment: Performed at [facility], [address] [place], [place] [address]  Glucose, capillary     Status: Abnormal   Collection Time: 10/14/18  7:44 AM  Result Value Ref Range   Glucose-Capillary 119 (H) 70 - 99 mg/dL  Glucose, capillary     Status: Abnormal   Collection Time: 10/14/18 12:13 PM  Result Value Ref Range   Glucose-Capillary 187 (H) 70 - 99 mg/dL   Comment 1 Notify RN   Glucose, capillary     Status: Abnormal   Collection Time: 10/14/18  5:19 PM  Result Value Ref Range   Glucose-Capillary 129 (H) 70 - 99 mg/dL  Glucose, capillary     Status: Abnormal   Collection Time:  10/14/18  9:35 PM  Result Value Ref Range   Glucose-Capillary 165 (H) 70 - 99 mg/dL  CBC     Status: Abnormal   Collection Time: 10/15/18  5:57 AM  Result Value Ref Range   WBC 11.7 (H) 4.0 - 10.5 K/uL   RBC 4.03 (L) 4.22 - 5.81 MIL/uL   Hemoglobin 11.8 (L) 13.0 - 17.0 g/dL   HCT 36.4 (L) 39.0 - 52.0 %   MCV 90.3 80.0 - 100.0 fL   MCH 29.3 26.0 - 34.0 pg   MCHC 32.4 30.0 - 36.0 g/dL   RDW 15.0 11.5 - 15.5 %   Platelets 171 150 - 400 K/uL   nRBC 0.0 0.0 - 0.2 %    Comment: Performed at [facility], [address] [place], [place] [address]  Basic metabolic panel     Status: Abnormal   Collection Time: 10/15/18  5:57 AM  Result Value Ref Range   Sodium 136 135 - 145 mmol/L   Potassium 4.8 3.5 - 5.1 mmol/L   Chloride 103 98 - 111 mmol/L   CO2 23 22 - 32 mmol/L   Glucose, Bld 195 (H) 70 - 99 mg/dL   BUN 45 (H) 8 - 23 mg/dL   Creatinine, Ser 2.44 (H) 0.61 - 1.24 mg/dL   Calcium 8.3 (L) 8.9 - 10.3 mg/dL   GFR calc non Af Amer 24 (L) >60 mL/min   GFR calc Af Amer 28 (L) >60 mL/min   Anion gap 10 5 - 15    Comment: Performed at [facility], [address] [place], [place] [address]  Glucose, capillary     Status: Abnormal   Collection Time: 10/15/18  8:02  AM  Result Value Ref Range   Glucose-Capillary 190 (H) 70 - 99 mg/dL  Glucose, capillary     Status: Abnormal   Collection Time: 10/15/18 12:08 PM  Result Value Ref Range   Glucose-Capillary 187 (H) 70 - 99 mg/dL  Glucose, capillary     Status: Abnormal   Collection Time: 10/15/18  4:54 PM  Result Value Ref Range   Glucose-Capillary 232 (H) 70 - 99 mg/dL  Glucose, capillary     Status: Abnormal   Collection Time: 10/15/18  9:28 PM  Result Value Ref Range   Glucose-Capillary 175 (H) 70 - 99 mg/dL  CBC     Status: Abnormal   Collection Time: 10/16/18  5:51 AM  Result Value Ref Range   WBC 12.1 (H) 4.0 - 10.5 K/uL   RBC 4.07 (L) 4.22 - 5.81 MIL/uL   Hemoglobin 11.8 (L) 13.0 - 17.0 g/dL   HCT 36.2 (L) 39.0 - 52.0 %   MCV 88.9 80.0 - 100.0 fL   MCH 29.0 26.0 - 34.0 pg   MCHC 32.6 30.0 - 36.0 g/dL   RDW 14.6 11.5 - 15.5 %   Platelets 185 150 - 400 K/uL   nRBC 0.0 0.0 - 0.2 %    Comment: Performed at [facility], [address] [place], [place] [address]  Basic metabolic panel     Status: Abnormal   Collection Time: 10/16/18  5:51 AM  Result Value Ref Range   Sodium 133 (L) 135 - 145 mmol/L   Potassium 4.5 3.5 - 5.1 mmol/L   Chloride 102 98 - 111 mmol/L   CO2 21 (L) 22 - 32 mmol/L   Glucose, Bld 180 (H) 70 - 99 mg/dL   BUN 56 (H) 8 - 23 mg/dL  Creatinine, Ser 2.72 (H) 0.61 - 1.24 mg/dL   Calcium 8.2 (L) 8.9 - 10.3 mg/dL   GFR calc non Af Amer 21 (L) >60 mL/min   GFR calc Af Amer 24 (L) >60 mL/min   Anion gap 10 5 - 15    Comment: Performed at [facility], [address] [place], [place] [address]  Glucose, capillary     Status: Abnormal   Collection Time: 10/16/18  7:46 AM  Result Value Ref Range   Glucose-Capillary 183 (H) 70 - 99 mg/dL  Glucose, capillary     Status: Abnormal   Collection Time: 10/16/18 11:53 AM  Result Value Ref Range   Glucose-Capillary 183 (H) 70 - 99 mg/dL  Glucose, capillary     Status: Abnormal   Collection Time: 10/16/18   5:01 PM  Result Value Ref Range   Glucose-Capillary 132 (H) 70 - 99 mg/dL  Glucose, capillary     Status: Abnormal   Collection Time: 10/16/18  9:02 PM  Result Value Ref Range   Glucose-Capillary 116 (H) 70 - 99 mg/dL  Basic metabolic panel     Status: Abnormal   Collection Time: 10/17/18  5:54 AM  Result Value Ref Range   Sodium 136 135 - 145 mmol/L   Potassium 4.6 3.5 - 5.1 mmol/L   Chloride 109 98 - 111 mmol/L   CO2 19 (L) 22 - 32 mmol/L   Glucose, Bld 150 (H) 70 - 99 mg/dL   BUN 57 (H) 8 - 23 mg/dL   Creatinine, Ser 2.40 (H) 0.61 - 1.24 mg/dL   Calcium 8.0 (L) 8.9 - 10.3 mg/dL   GFR calc non Af Amer 24 (L) >60 mL/min   GFR calc Af Amer 28 (L) >60 mL/min   Anion gap 8 5 - 15    Comment: Performed at [facility], [address] [place], [place] [address]  Glucose, capillary     Status: Abnormal   Collection Time: 10/17/18  7:56 AM  Result Value Ref Range   Glucose-Capillary 141 (H) 70 - 99 mg/dL   Comment 1 Notify RN   Glucose, capillary     Status: Abnormal   Collection Time: 10/17/18 11:54 AM  Result Value Ref Range   Glucose-Capillary 161 (H) 70 - 99 mg/dL   Comment 1 Notify RN   Creatinine, serum     Status: Abnormal   Collection Time: 10/17/18  1:18 PM  Result Value Ref Range   Creatinine, Ser 2.31 (H) 0.61 - 1.24 mg/dL   GFR calc non Af Amer 26 (L) >60 mL/min   GFR calc Af Amer 30 (L) >60 mL/min    Comment: Performed at [facility], [address] [place], [place] [address]  Glucose, capillary     Status: Abnormal   Collection Time: 10/17/18  5:15 PM  Result Value Ref Range   Glucose-Capillary 278 (H) 70 - 99 mg/dL   Comment 1 Notify RN   Glucose, capillary     Status: Abnormal   Collection Time: 10/17/18  9:20 PM  Result Value Ref Range   Glucose-Capillary 260 (H) 70 - 99 mg/dL   Comment 1 Notify RN   Basic metabolic panel     Status: Abnormal   Collection Time: 10/18/18  4:21 AM  Result Value Ref Range   Sodium 137 135 - 145  mmol/L   Potassium 4.6 3.5 - 5.1 mmol/L   Chloride 109 98 - 111 mmol/L   CO2 21 (L) 22 -  32 mmol/L   Glucose, Bld 238 (H) 70 - 99 mg/dL   BUN 55 (H) 8 - 23 mg/dL   Creatinine, Ser 2.36 (H) 0.61 - 1.24 mg/dL   Calcium 8.0 (L) 8.9 - 10.3 mg/dL   GFR calc non Af Amer 25 (L) >60 mL/min   GFR calc Af Amer 29 (L) >60 mL/min   Anion gap 7 5 - 15    Comment: Performed at [facility], [address] [place], [place] [address]  Glucose, capillary     Status: Abnormal   Collection Time: 10/18/18  8:04 AM  Result Value Ref Range   Glucose-Capillary 233 (H) 70 - 99 mg/dL    Radiology Dg Foot Complete Right  Result Date: 12/14/2018 CLINICAL DATA:  Non-healing wound on right calcaneus x7 months. Hx of diabetes. EXAM: RIGHT FOOT COMPLETE - 3+ VIEW COMPARISON:  None. FINDINGS: There is a soft tissue wound along the posterior margin of the healed. There is no underlying bone resorption to suggest osteomyelitis. No fracture or bone lesion.  Joints normally spaced and aligned. Small plantar calcaneal spur. Dense arterial atherosclerotic calcifications extend from the ankle into the foot. There is nonspecific soft tissue edema. IMPRESSION: 1. No evidence of osteomyelitis. 2. No fracture or bone lesion. 3. Soft tissue wound over the dorsal aspect of the heel. Electronically Signed   By: Lajean Manes M.D.   On: 12/14/2018 16:55    Assessment/Plan Hyperlipidemia lipid control important in reducing the progression of atherosclerotic disease. Continue statin therapy   Type 2 diabetes mellitus treated with insulin (HCC) blood glucose control important in reducing the progression of atherosclerotic disease. Also, involved in wound healing. On appropriate medications.   Essential hypertension blood pressure control important in reducing the progression of atherosclerotic disease. On appropriate oral medications.  A-fib (HCC) On anticoagulation. Rapid A. fib lead to a prolonged  hospitalization lastyear.  S/P BKA (below knee amputation) unilateral, left ([place]) This is healed and doing well  Atherosclerosis of native arteries of the extremities with ulceration ([place]) The patient has extensive ulceration of the right heel, foot and lower leg.  The heel ulceration is particularly worrisome and there is about a 6 to 7 cm oval-shaped ulceration with necrotic eschar present.  Although his ABIs today were 1.2 on that right side this was likely noncompressible as his waveforms are monophasic indicative of significant vascular disease.  He has known significant disease on that side and we had previously planned an angiogram in the past that had to be delayed and eventually canceled due to cardiac issues. This is a very worrisome and obviously a critical and limb threatening situation.  At this point, the patient is a very high risk of limb loss even with revascularization.  These large heel ulcerations are very difficult to get healed.  I had a discussion with the patient, as well as his son and wife who are on the phone due to COVID-19 restrictions, and tried to explain the grave nature of the situation with them.  Given his significant vascular disease, angiogram with possible intervention of the right lower extremity is indicated to try to improve the likelihood of limb salvage.  I have discussed the procedure in detail and they are agreeable to proceed.  Following this, he will continue to follow with the wound care center and I think he will ultimately need some debridement of that heel ulceration.  I have given him some tramadol for pain.  I have discussed the situation extensively  with he and his family.    Leotis Pain, MD  12/15/2018 11:44 AM    This note was created with Dragon medical transcription system.  Any errors from dictation are purely unintentional

## 2018-12-15 NOTE — Assessment & Plan Note (Signed)
The patient has extensive ulceration of the right heel, foot and lower leg.  The heel ulceration is particularly worrisome and there is about a 6 to 7 cm oval-shaped ulceration with necrotic eschar present.  Although his ABIs today were 1.2 on that right side this was likely noncompressible as his waveforms are monophasic indicative of significant vascular disease.  He has known significant disease on that side and we had previously planned an angiogram in the past that had to be delayed and eventually canceled due to cardiac issues. This is a very worrisome and obviously a critical and limb threatening situation.  At this point, the patient is a very high risk of limb loss even with revascularization.  These large heel ulcerations are very difficult to get healed.  I had a discussion with the patient, as well as his son and wife who are on the phone due to COVID-19 restrictions, and tried to explain the grave nature of the situation with them.  Given his significant vascular disease, angiogram with possible intervention of the right lower extremity is indicated to try to improve the likelihood of limb salvage.  I have discussed the procedure in detail and they are agreeable to proceed.  Following this, he will continue to follow with the wound care center and I think he will ultimately need some debridement of that heel ulceration.  I have given him some tramadol for pain.  I have discussed the situation extensively with he and his family.

## 2018-12-15 NOTE — Patient Instructions (Signed)
Angiogram  An angiogram is a procedure used to examine the blood vessels. In this procedure, contrast dye is injected through a long, thin tube (catheter) into an artery. X-rays are then taken, which show if there is a blockage or problem in a blood vessel. The catheter may be inserted in:  Your groin area. This is the most common.  The fold of your arm, near your elbow.  Your wrist. Tell a health care provider about:  Any allergies you have, including allergies to shellfish or contrast dye.  All medicines you are taking, including vitamins, herbs, eye drops, creams, and over-the-counter medicines.  Any problems you or family members have had with anesthetic medicines.  Any blood disorders you have.  Any surgeries you have had.  Any previous kidney problems or failure you have had.  Any medical conditions you have.  Whether you are pregnant or may be pregnant.  Whether you are breastfeeding. What are the risks? Generally, this is a safe procedure. However, problems may occur, including:  Infection or bruising at the catheter area.  Damage to other structures or organs, including rupture of blood vessels or damage to arteries.  Allergic reaction to the contrast dye used.  Kidney damage from the contrast dye used.  Blood clots that can lead to a stroke or heart attack. What happens before the procedure? Staying hydrated Follow instructions from your health care provider about hydration, which may include:  Up to 2 hours before the procedure - you may continue to drink clear liquids, such as water, clear fruit juice, black coffee, and plain tea. Eating and drinking restrictions Follow instructions from your health care provider about eating and drinking, which may include:  8 hours before the procedure - stop eating heavy meals or foods such as meat, fried foods, or fatty foods.  6 hours before the procedure - stop eating light meals or foods, such as toast or cereal.   6 hours before the procedure - stop drinking milk or drinks that contain milk.  2 hours before the procedure - stop drinking clear liquids. General instructions  Ask your health care provider about: ? Changing or stopping your normal medicines. This is important if you take diabetes medicines or blood thinners. ? Taking medicines such as aspirin and ibuprofen. These medicines can thin your blood. Do not take these medicines before your procedure if your doctor tells you not to.  You may have blood samples taken.  Plan to have someone take you home from the hospital or clinic.  If you will be going home right after the procedure, plan to have someone with you for 24 hours. What happens during the procedure?  To reduce your risk of infection: ? Your health care team will wash or sanitize their hands. ? Your skin will be washed with soap. ? Hair may be removed from the insertion area.  You will lie on your back on an X-ray table. You may be strapped to the table if it is tilted.  An IV tube will be inserted into one of your veins.  Electrodes may be placed on your chest to monitor your heart rate during the procedure.  You will be given one or more of the following: ? A medicine to help you relax (sedative). ? A medicine to numb the area where the catheter will be inserted (local anesthetic).  The catheter will be inserted into an artery using a guide wire. A type of X-ray (fluoroscopy) will be used to help   guide the catheter to the blood vessel to be examined.  A contrast dye will then be injected into the catheter, and X-rays will be taken. The contrast will help to show where any narrowing or blockages are located in the blood vessels. You may feel flushed as the contrast dye is injected.  After the X-ray is complete, the catheter will be removed.  A bandage (dressing) will be placed over the site where the catheter was inserted. Pressure will be applied to help stop any  bleeding. The procedure may vary among health care providers and hospitals. What happens after the procedure?  Your blood pressure, heart rate, breathing rate, and blood oxygen level will be monitored until the medicines you were given have worn off.  You will be kept in bed lying flat for several hours. If the catheter was inserted through your leg, you will be instructed not to bend or cross your legs.  The insertion area and the pulse in your feet or wrist will be checked frequently.  You will be instructed to drink plenty of fluids. This will help wash the contrast dye out of your body.  Additional blood tests and X-rays may be done.  Tests to check the electrical activity in your heart (electrocardiogram) may be done.  Do not drive for 24 hours if you received a sedative.  It is up to you to get the results of your procedure. Ask your health care provider, or the department that is doing the procedure, when your results will be ready. Summary  An angiogram is a procedure used to examine the blood vessels.  In this procedure, contrast dye is injected through a long, thin tube (catheter) into an artery. X-rays are then taken.  Before the procedure, follow your health care provider's instructions about eating and drinking restrictions. You may be asked to stop eating and drinking several hours before the procedure.  After the procedure, you will need to lie flat for several hours and drink plenty of fluids. This information is not intended to replace advice given to you by your health care provider. Make sure you discuss any questions you have with your health care provider. Document Released: 02/17/2005 Document Revised: 04/22/2017 Document Reviewed: 06/16/2016 Elsevier Patient Education  2020 [employer].

## 2018-12-15 NOTE — Assessment & Plan Note (Signed)
This is healed and doing well

## 2018-12-15 NOTE — Telephone Encounter (Signed)
Patient was seen today in office. I asked the son to wait and I would bring the paperwork out to them and he left, I called the son and gave him the pre-procedure instructions regarding the patient. Patient is to do his Covid test today 12/15/2018 at the [facility] between 12:30-2:30 and his procedure is on 12/18/2018 with Dr. Lucky Cowboy,  patient has a  9:30 am arrival time. All dates and times were given to the patients son.

## 2018-12-16 LAB — SARS CORONAVIRUS 2 (TAT 6-24 HRS): SARS Coronavirus 2: NEGATIVE

## 2018-12-16 NOTE — Progress Notes (Signed)
EGOR, FULLILOVE ([id number]) Visit Report for 12/14/2018 Chief Complaint Document Details Patient Name: [name], [name]. Date of Service: 12/14/2018 9:45 AM Medical Record Number: [id number] Patient Account Number: [id number] Date of Birth/Sex: 1935/01/20 (84 y.o. M) Treating RN: Army Melia Primary Care Provider: Serafina Royals Other Clinician: Referring Provider: Referral, Self Treating Provider/Extender: Melburn Hake, HOYT Weeks in Treatment: 0 Information Obtained from: Patient Chief Complaint Right leg ulcers Electronic Signature(s) Signed: 12/16/2018 12:16:19 AM By: Worthy Keeler PA-C Entered By: Worthy Keeler on 12/14/2018 10:18:29 [name] ([id number]) -------------------------------------------------------------------------------- Debridement Details Patient Name: [name]. Date of Service: 12/14/2018 9:45 AM Medical Record Number: [id number] Patient Account Number: [id number] Date of Birth/Sex: 11-23-1934 (84 y.o. M) Treating RN: Army Melia Primary Care Provider: Serafina Royals Other Clinician: Referring Provider: Referral, Self Treating Provider/Extender: STONE III, HOYT Weeks in Treatment: 0 Debridement Performed for Wound #7 Right,Distal,Anterior Lower Leg Assessment: Performed By: Physician STONE III, HOYT E., PA-C Debridement Type: Chemical/Enzymatic/Mechanical Agent Used: Santyl Severity of Tissue Pre Fat layer exposed Debridement: Level of Consciousness (Pre- Awake and Alert procedure): Pre-procedure Verification/Time Yes - 10:35 Out Taken: Start Time: 10:35 Instrument: Other : tongue blade Bleeding: None End Time: 10:36 Response to Treatment: Procedure was tolerated well Level of Consciousness Awake and Alert (Post-procedure): Post Debridement Measurements of Total Wound Length: (cm) 1.3 Width: (cm) 0.6 Depth: (cm) 0.1 Volume: (cm) 0.061 Character of Wound/Ulcer Post Debridement: Stable Severity of Tissue Post  Debridement: Fat layer exposed Post Procedure Diagnosis Same as Pre-procedure Electronic Signature(s) Signed: 12/14/2018 3:30:04 PM By: Army Melia Signed: 12/16/2018 12:16:19 AM By: Worthy Keeler PA-C Entered By: Army Melia on 12/14/2018 10:35:51 [name] ([id number]) -------------------------------------------------------------------------------- Debridement Details Patient Name: [name]. Date of Service: 12/14/2018 9:45 AM Medical Record Number: [id number] Patient Account Number: [id number] Date of Birth/Sex: [birth year] (84 y.o. M) Treating RN: Army Melia Primary Care Provider: Serafina Royals Other Clinician: Referring Provider: Referral, Self Treating Provider/Extender: STONE III, HOYT Weeks in Treatment: 0 Debridement Performed for Wound #6 Right,Medial Malleolus Assessment: Performed By: Physician STONE III, HOYT E., PA-C Debridement Type: Chemical/Enzymatic/Mechanical Agent Used: Santyl Severity of Tissue Pre Fat layer exposed Debridement: Level of Consciousness (Pre- Awake and Alert procedure): Pre-procedure Verification/Time Yes - 10:35 Out Taken: Start Time: 10:35 Instrument: Other : tongue blade Bleeding: None End Time: 10:36 Response to Treatment: Procedure was tolerated well Level of Consciousness Awake and Alert (Post-procedure): Post Debridement Measurements of Total Wound Length: (cm) 1 Width: (cm) 1.1 Depth: (cm) 0.2 Volume: (cm) 0.173 Character of Wound/Ulcer Post Debridement: Stable Severity of Tissue Post Debridement: Fat layer exposed Post Procedure Diagnosis Same as Pre-procedure Electronic Signature(s) Signed: 12/14/2018 3:30:04 PM By: Army Melia Signed: 12/16/2018 12:16:19 AM By: Worthy Keeler PA-C Entered By: Army Melia on 12/14/2018 10:36:21 [name] ([id number]) -------------------------------------------------------------------------------- Debridement Details Patient Name: Jesse Floor  [name]. Date of Service: 12/14/2018 9:45 AM Medical Record Number: [id number] Patient Account Number: [id number] Date of Birth/Sex: Jul 29, 1934 (84 y.o. M) Treating RN: Army Melia Primary Care Provider: Serafina Royals Other Clinician: Referring Provider: Referral, Self Treating Provider/Extender: STONE III, HOYT Weeks in Treatment: 0 Debridement Performed for Wound #5 Right Calcaneus Assessment: Performed By: Physician STONE III, HOYT E., PA-C Debridement Type: Chemical/Enzymatic/Mechanical Agent Used: Santyl Severity of Tissue Pre Fat layer exposed Debridement: Level of Consciousness (Pre- Awake and Alert procedure): Pre-procedure Verification/Time Yes - 10:35 Out Taken: Start Time: 10:35 Instrument: Other : tongue blade Bleeding: None End Time: 10:36 Response to Treatment: Procedure was tolerated well Level  of Consciousness Awake and Alert (Post-procedure): Post Debridement Measurements of Total Wound Length: (cm) 5 Width: (cm) 3 Depth: (cm) 0.3 Volume: (cm) 3.534 Character of Wound/Ulcer Post Debridement: Stable Severity of Tissue Post Debridement: Fat layer exposed Post Procedure Diagnosis Same as Pre-procedure Electronic Signature(s) Signed: 12/14/2018 3:30:04 PM By: Army Melia Signed: 12/16/2018 12:16:19 AM By: Worthy Keeler PA-C Entered By: Army Melia on 12/14/2018 10:36:48 [name] ([id number]) -------------------------------------------------------------------------------- HPI Details Patient Name: [name]. Date of Service: 12/14/2018 9:45 AM Medical Record Number: [id number] Patient Account Number: [id number] Date of Birth/Sex: 08/14/1934 (84 y.o. M) Treating RN: Army Melia Primary Care Provider: Serafina Royals Other Clinician: Referring Provider: Referral, Self Treating Provider/Extender: Melburn Hake, HOYT Weeks in Treatment: 0 History of Present Illness HPI Description: 11/29/16 patient presents today for evaluation concerning [name]  nonhealing surgical wound following amputation of his left third through fifth toes which was performed on 10/15/16. Unfortunately following this amputation the circle side has not closed appropriately and he continues to have [name] significant amount of discomfort. He did have an x-ray performed on 10/21/16 which showed him being status post D3 toe amputation but otherwise no acute findings. Patient also has venous imaging of the left lower extremity which revealed no evidence of DVT on 10/22/16. His last hemoglobin [name] 1C was 8.5 and this was on 10/22/16 and prior to his amputation patient did have vascular surgery with Dr. Leotis Pain where he had [name] catheter placed in the left anterior tibialis/dorsalis pedis artery from the right femoral approach. Percutaneous transluminal angioplasty of the left anterior tibial artery/dorsalis pedis artery was performed. This procedure apparently went very well. Unfortunately his healing just has not been what both Dr. Elvina Mattes nor himself were expecting or wanting. The question posed to [place] today is whether hyperbaric therapy would be of benefit for him. READMISSION 02/01/18 This is now [name] 84 year old man we had in the clinic over 2 years ago. At that point he was here for evaluation of amputations of several of his toes on his left foot with [name] nonhealing surgical wound. He was only here for 1 visit. The patient tells me he went on to have [name] left below-knee amputation slightly over [name] year ago. Sometime earlier this year perhaps in April he traumatized his right shin and he has had an open wound here since then. He is already seen Dr. Lucky Cowboy and vein and vascular. He had noninvasive tests that showed an ABI on the right of 1.23 but [name] TBI in the right of only 0.27. He had monophasic waveforms at the anterior tibial and biphasic at the posterior tibial. The overall interpretation was that although his resting right ankle brachial index is within normal limits this may be falsely  elevated. Furthermore his toe brachial index was quite [name] bit reduced. According to the last note from vein and vascular on 01/27/18 they discussed angiogram with possible revascularization on the right. He was felt to be reasonably high risk because of his and inability inability to stay still requiring general anesthesia therefore. They told him to think about it and get back to them. He was referred here for review of this wound. As I understand things they've been using Xeroform I'm not clear what they've been putting on this although they did talk about "an ointment". 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 During his admission to hospital in July MRSA was apparently cultured from this wound and he received [name] course of Doxy. As he has had recent noninvasive arterial studies done on 01/27/18,no ABIs were felt to be necessary here. 02/08/18; patient arrives in with [name] better looking wound. Dimensions are smaller. using silver collagen 02/15/18; wound is measuring smaller. Still with adherent surface material requiring debridement. We've been using silver collagen 02/22/18; wound is measuring smaller. No debridement is required. We're using silver collagen under compression. 03/01/18; continued improvement in wound dimensions. No debridement is required. We've been using silver collagen under compression 03/08/18; wound stalled in terms of dimensions this week. Surface of the wound still looks healthy we've been using silver collagen under compression he has home health changing this. Change to Hydrofera Blue today 03/15/18; inadvertently put silver collagen on the wound today. In general his wound continues to contract. Using border foam cover 03/22/18; changed [name] dressing to calcium alginate. Wound  on the right leg is just about closed. He continues to have irritated hyperkeratotic skin around the wound. He claims he did not have [name] skin lesion in this area that the wound was caused by trauma although the history is vague 03/29/18; using calcium alginate. Came in with debris over wound surface although her intake nurse noted drainage. The skin around this looks [name] lot better with the triamcinolone I prescribed. [name], [name]. ([id number]) 04/05/18; used Calcium alginate. The wound is closed. This was initially traumatic it looks as though he has some degree of chronic venous insufficiency may be with stasis dermatitis. For now I have recommended simply support stockings Readmission: 06/26/18 on evaluation today patient presents for reevaluation in our clinic concerning issue on his right medial lower extremity where it appears in my pinion that this was likely small skin tears from him scraping this part of his leg with that being said there does not appear to be any significant injury just several small. Partial thickness skin tears. Overall this is good news and that it's not too deep and hopefully should healed quite significantly faster compared to his previous admissions and what was going on at that point. There is no evidence of infection significantly although there's [name] little erythema around I believe this is likely more due to inflammation that it is to actually infection. Fortunately there is no sign of systemic infection. No fevers, chills, nausea, or vomiting noted at this time. 07/03/18 on evaluation today patient appears to be doing rather well in regard to his lower extremity ulcers. In fact of the five smaller areas that were open there only one of those locations still open as of today. Overall this is excellent news. 07/10/18 on evaluation today patient appears to be doing excellent in regard to his right lower extremity. In fact his wounds appear to be completely healed which  is excellent news. There's no signs of infection at this time. Overall I'm very pleased in this regard. Readmission: 12/14/2018 patient presents today for reevaluation or clinic concerning issues that he is having with wounds on the right lower extremity. Specifically on the right heel, right medial malleolus, and right anterior/distal lower leg. He tells me at this point that he is having discomfort unfortunately at these locations. 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 He also has congestive heart failure, hypertension, and paroxysmal atrial fibrillation. 2 months ago he did have [name] hip fracture which  was repaired and he was subsequently in the hospital which is when [name] lot of the wounds especially the heel developed. Fortunately there is no signs of active infection at this time unfortunately he is somewhat frustrated with the situation and his wounds currently. Electronic Signature(s) Signed: 12/15/2018 5:31:12 PM By: Worthy Keeler PA-C Entered By: Worthy Keeler on 12/15/2018 17:31:12 [name] ([id number]) -------------------------------------------------------------------------------- Physical Exam Details Patient Name: [name]. Date of Service: 12/14/2018 9:45 AM Medical Record Number: [id number] Patient Account Number: [id number] Date of Birth/Sex: 1934/06/14 (84 y.o. M) Treating RN: Army Melia Primary Care Provider: Serafina Royals Other Clinician: Referring Provider: Referral, Self Treating Provider/Extender: STONE III, HOYT Weeks in Treatment: 0 Constitutional patient is hypertensive.. pulse regular and within target range for patient.Marland Kitchen respirations regular, non-labored and within target range for patient.Marland Kitchen temperature within target range for patient.. Well-nourished and well-hydrated in no acute distress. Eyes conjunctiva clear no eyelid edema noted. pupils equal round and reactive to light and accommodation. Respiratory normal  breathing without difficulty. clear to auscultation bilaterally. Cardiovascular regular rate and rhythm with normal S1, S2. 1+ dorsalis pedis/posterior tibialis pulses. 1+ pitting edema of the bilateral lower extremities. Gastrointestinal (GI) soft, non-tender, non-distended, +BS. no ventral hernia noted. Musculoskeletal normal gait and posture. Patient has [name] left below-knee amputation.Marland Kitchen Psychiatric this patient is able to make decisions and demonstrates good insight into disease process. Alert and Oriented x 3. pleasant and cooperative. Notes Upon inspection today patient's wound bed at each location did appear to have necrotic tissue that I think would benefit from the use of Santyl since right now I really think we need to see about getting him an appointment with vascular in order to evaluate for his right ABIs and TBI's to ensure he has sufficient flow to heal these wounds. Obviously I did not want to perform any significant sharp debridement until I confirmed that this is indeed the case. The patient understands and is in agreement with this plan. Electronic Signature(s) Signed: 12/15/2018 5:32:40 PM By: Worthy Keeler PA-C Entered By: Worthy Keeler on 12/15/2018 17:32:40 [name] ([id number]) -------------------------------------------------------------------------------- Physician Orders Details Patient Name: [name]. Date of Service: 12/14/2018 9:45 AM Medical Record Number: [id number] Patient Account Number: [id number] Date of Birth/Sex: [birth year] (84 y.o. 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 - in clinic o Cleanse wound with mild soap and water Wound #6 Right,Medial Malleolus o Clean wound with Normal Saline. - in clinic o Cleanse wound with mild soap and water Wound #7 Right,Distal,Anterior Lower Leg o Clean wound with Normal Saline. - in clinic o Cleanse wound with mild soap and water Anesthetic (add to Medication List) Wound #5 Right Calcaneus o Topical Lidocaine 4% cream applied to wound bed prior to debridement (In Clinic Only). - in clinic Wound #6 Right,Medial Malleolus o Topical Lidocaine 4% cream applied to wound bed prior to  debridement (In Clinic Only). - in clinic Wound #7 Right,Distal,Anterior Lower Leg o Topical Lidocaine 4% cream applied to wound bed prior to debridement (In Clinic Only). - in clinic Primary Wound Dressing Wound #5 Right Calcaneus o Santyl Ointment Wound #6 Right,Medial Malleolus o Santyl Ointment Wound #7 Right,Distal,Anterior Lower Leg o Santyl Ointment [name], [name]. ([id number]) Secondary Dressing Wound #5 Right Calcaneus o Saline moistened gauze o Boardered Foam Dressing Wound #6 Right,Medial Malleolus o Saline moistened gauze o Boardered Foam Dressing Wound #7 Right,Distal,Anterior Lower Leg o Saline moistened gauze o Boardered Foam Dressing Dressing Change Frequency Wound #5 Right Calcaneus o Change dressing every day. Wound #6 Right,Medial Malleolus o Change dressing every day. Wound #7 Right,Distal,Anterior Lower Leg o Change dressing every day. Follow-up Appointments Wound #5 Right  Calcaneus o Return Appointment in 1 week. Wound #6 Right,Medial Malleolus o Return Appointment in 1 week. Wound #7 Right,Distal,Anterior Lower Leg o Return Appointment in 1 week. Radiology o X-ray, foot - Right foot Services and Therapies o Arterial Studies- Unilateral - right ABIs and TBIs Patient Medications Allergies: Iodinated Contrast- Oral and IV Dye, latex Notifications Medication Indication Start End Santyl 12/14/2018 DOSE topical 250 unit/gram ointment - ointment topical applied nickel thick to the wound bed and then cover with [name] dressing as directed Electronic Signature(s) Signed: 12/14/2018 1:22:58 PM By: Worthy Keeler PA-C Entered By: Worthy Keeler on 12/14/2018 13:22:57 [name] ([id number]) [place], [place] ([id number]) -------------------------------------------------------------------------------- Problem List Details Patient Name: [name]. Date of Service: 12/14/2018 9:45 AM Medical Record Number: [id number] Patient Account Number: [id number] Date of Birth/Sex: 27-Dec-1934 (84 y.o. 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 with fat 12/14/2018 No Yes layer exposed L97.812 Non-pressure chronic ulcer of other part of right lower leg 12/14/2018 No Yes with fat layer exposed Z89.512 Acquired absence of left leg below knee 12/14/2018 No Yes I50.42 Chronic combined systolic (congestive) and diastolic 4/85/4627 No Yes (congestive) heart failure I10 Essential (primary) hypertension 12/14/2018 No Yes I48.0 Paroxysmal atrial fibrillation 12/14/2018 No Yes Inactive Problems Resolved  Problems Electronic Signature(s) Signed: 12/16/2018 12:16:19 AM By: Worthy Keeler PA-C Entered By: Worthy Keeler on 12/14/2018 10:18:14 Scholze, [name] ([id number]) -------------------------------------------------------------------------------- Progress Note Details Patient Name: [name]. Date of Service: 12/14/2018 9:45 AM Medical Record Number: [id number] Patient Account Number: [id number] Date of Birth/Sex: 1935/05/01 (84 y.o. M) Treating RN: Army Melia Primary Care Provider: Serafina Royals Other Clinician: Referring Provider: Referral, Self Treating Provider/Extender: Melburn Hake, HOYT Weeks in Treatment: 0 Subjective Chief Complaint Information obtained from Patient Right leg ulcers History of Present Illness (HPI) 11/29/16 patient presents today for evaluation concerning [name] nonhealing surgical wound following amputation of his left third through fifth toes which was performed on 10/15/16. Unfortunately following this amputation the circle side has not closed appropriately and he continues to have [name] significant amount of discomfort. He did have an x-ray performed on 10/21/16 which showed him being status post D3 toe amputation but otherwise no acute findings. Patient also has venous imaging of the left lower extremity which revealed no evidence of DVT on 10/22/16. His last hemoglobin [name] 1C was 8.5 and this was on 10/22/16 and prior to his amputation patient did  have vascular surgery with Dr. Leotis Pain where he had [name] catheter placed in the left anterior tibialis/dorsalis pedis artery from the right femoral approach. Percutaneous transluminal angioplasty of the left anterior tibial artery/dorsalis pedis artery was performed. This procedure apparently went very well. Unfortunately his healing just has not been what both Dr. Elvina Mattes nor himself were expecting or wanting. The question posed to [place] today is whether hyperbaric therapy would be of benefit for  him. READMISSION 02/01/18 This is now [name] 84 year old man we had in the clinic over 2 years ago. At that point he was here for evaluation of amputations of several of his toes on his left foot with [name] nonhealing surgical wound. He was only here for 1 visit. The patient tells me he went on to have [name] left below-knee amputation slightly over [name] year ago. Sometime earlier this year perhaps in April he traumatized his right shin and he has had an open wound here since then. He is already seen Dr. Lucky Cowboy and vein and vascular. He had noninvasive tests that showed an ABI on the right of 1.23 but [name] TBI in the right of only 0.27. He had monophasic waveforms at the anterior tibial and biphasic at the posterior tibial. The overall interpretation was that although his resting right ankle brachial index is within normal limits this may be falsely elevated. Furthermore his toe brachial index was quite [name] bit reduced. According to the last note from vein and vascular on 01/27/18 they discussed angiogram with possible revascularization on the right. He was felt to be reasonably high risk because of his and inability inability to stay still requiring general anesthesia therefore. They told him to think about it and get back to them. He was referred here for review of this wound. As I understand things they've been using Xeroform I'm not clear what they've been putting on this although they did talk about "an ointment". 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 During his admission to hospital in July MRSA was apparently cultured from this wound and he received [name] course of Doxy. As he has had recent noninvasive arterial studies done on 01/27/18,no ABIs were felt to be necessary here. 02/08/18; patient arrives in with [name] better  looking wound. Dimensions are smaller. using silver collagen 02/15/18; wound is measuring smaller. Still with adherent surface material requiring debridement. We've been using silver collagen 02/22/18; wound is measuring smaller. No debridement is required. We're using silver collagen under compression. 03/01/18; continued improvement in wound dimensions. No debridement is required. We've been using silver collagen under compression 03/08/18; wound stalled in terms of dimensions this week. Surface of the wound still looks healthy we've been using silver collagen under compression he has home health changing this. Change to Hydrofera Blue today 03/15/18; inadvertently put silver collagen on the wound today. In general his wound continues to contract. Using border foam [name], [name]. ([id number]) cover 03/22/18; changed [name] dressing to calcium alginate. Wound on the right leg is just about closed. He continues to have irritated hyperkeratotic skin around the wound. He claims he did not have [name] skin lesion in this area that the wound was caused by trauma although the history is vague 03/29/18; using calcium alginate. Came in with debris over wound surface although her intake nurse noted drainage.  The skin around this looks [name] lot better with the triamcinolone I prescribed. 04/05/18; used Calcium alginate. The wound is closed. This was initially traumatic it looks as though he has some degree of chronic venous insufficiency may be with stasis dermatitis. For now I have recommended simply support stockings Readmission: 06/26/18 on evaluation today patient presents for reevaluation in our clinic concerning issue on his right medial lower extremity where it appears in my pinion that this was likely small skin tears from him scraping this part of his leg with that being said there does not appear to be any significant injury just several small. Partial thickness skin tears. Overall this is good news and that  it's not too deep and hopefully should healed quite significantly faster compared to his previous admissions and what was going on at that point. There is no evidence of infection significantly although there's [name] little erythema around I believe this is likely more due to inflammation that it is to actually infection. Fortunately there is no sign of systemic infection. No fevers, chills, nausea, or vomiting noted at this time. 07/03/18 on evaluation today patient appears to be doing rather well in regard to his lower extremity ulcers. In fact of the five smaller areas that were open there only one of those locations still open as of today. Overall this is excellent news. 07/10/18 on evaluation today patient appears to be doing excellent in regard to his right lower extremity. In fact his wounds appear to be completely healed which is excellent news. There's no signs of infection at this time. Overall I'm very pleased in this regard. Readmission: 12/14/2018 patient presents today for reevaluation or clinic concerning issues that he is having with wounds on the right lower extremity. Specifically on the right heel, right medial malleolus, and right anterior/distal lower leg. He tells me at this point that he is having discomfort unfortunately at these locations. 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 He also has congestive heart failure, hypertension, and paroxysmal atrial fibrillation. 2 months ago he did have [name] hip fracture which was repaired and he was subsequently in the hospital which is when [name] lot of the wounds especially the heel developed. Fortunately there is no signs of active infection at this time unfortunately he is somewhat frustrated with the situation and his wounds currently. Patient History Information obtained from Patient. Allergies Iodinated Contrast- Oral and IV Dye, latex Family History Cancer - Siblings, Hypertension - Siblings, Stroke - Siblings, No  family history of Diabetes, Heart Disease, Kidney Disease, Lung Disease, Seizures, Thyroid Problems, Tuberculosis. Social History Former smoker, Marital Status - Married, Alcohol Use - Never, Drug Use - No History, Caffeine Use - Daily. Medical History Eyes Patient has history of Cataracts - removed Denies history of Glaucoma, Optic Neuritis Ear/Nose/Mouth/Throat Denies history of Chronic sinus problems/congestion, Middle ear problems Hematologic/Lymphatic Denies history of Anemia, Hemophilia, Human Immunodeficiency Virus, Lymphedema, Sickle Cell Disease Respiratory Garris, Nyjah [name]. ([id number]) Patient has history of Sleep Apnea - c-pap Denies history of Aspiration, Asthma, Chronic Obstructive Pulmonary Disease (COPD), Pneumothorax, Tuberculosis Cardiovascular Patient has history of Arrhythmia, Congestive Heart Failure, Hypertension, Peripheral Arterial Disease Denies history of Angina, Coronary Artery Disease, Deep Vein Thrombosis, Hypotension, Myocardial Infarction, Peripheral Venous Disease, Phlebitis, Vasculitis Gastrointestinal Denies history of Cirrhosis , Colitis, Crohn s, Hepatitis [name], Hepatitis B, Hepatitis C Endocrine Patient has history of Type II Diabetes Denies history of Type I Diabetes Genitourinary Denies history of End Stage Renal Disease  Immunological Denies history of Lupus Erythematosus, Raynaud s, Scleroderma Integumentary (Skin) Denies history of History of Burn, History of pressure wounds Musculoskeletal Patient has history of Osteoarthritis Denies history of Gout, Rheumatoid Arthritis, Osteomyelitis Neurologic Denies history of Dementia, Neuropathy, Quadriplegia, Paraplegia, Seizure Disorder Oncologic Denies history of Received Chemotherapy, Received Radiation Psychiatric Denies history of Anorexia/bulimia, Confinement Anxiety Patient is treated with Insulin, Oral Agents. Blood sugar is not tested. Blood sugar results noted at the following  times: Breakfast - 145. Hospitalization/Surgery History - 09/22/2018- Right Hip Replacement. Medical And Surgical History Notes Constitutional Symptoms (General Health) HTN; Diabetes Type II, [name]-Fib; Neuropathy Musculoskeletal L BKA Review of Systems (ROS) Eyes Denies complaints or symptoms of Dry Eyes, Vision Changes, Glasses / Contacts. Ear/Nose/Mouth/Throat Denies complaints or symptoms of Difficult clearing ears, Sinusitis. Hematologic/Lymphatic Denies complaints or symptoms of Bleeding / Clotting Disorders, Human Immunodeficiency Virus. Respiratory Denies complaints or symptoms of Chronic or frequent coughs, Shortness of Breath. Cardiovascular Denies complaints or symptoms of Chest pain, LE edema. Gastrointestinal Denies complaints or symptoms of Frequent diarrhea, Nausea, Vomiting. Endocrine Denies complaints or symptoms of Hepatitis, Thyroid disease, Polydypsia (Excessive Thirst). Genitourinary Denies complaints or symptoms of Kidney failure/ Dialysis, Incontinence/dribbling. Immunological Denies complaints or symptoms of Hives, Itching. Integumentary (Skin) Complains or has symptoms of Wounds - Right Calcaneous, Swelling. Denies complaints or symptoms of Bleeding or bruising tendency, Breakdown. [name], [name]. ([id number]) Musculoskeletal Denies complaints or symptoms of Muscle Pain, Muscle Weakness. Neurologic Denies complaints or symptoms of Numbness/parasthesias, Focal/Weakness. Psychiatric Denies complaints or symptoms of Anxiety, Claustrophobia. Objective Constitutional patient is hypertensive.. pulse regular and within target range for patient.Marland Kitchen respirations regular, non-labored and within target range for patient.Marland Kitchen temperature within target range for patient.. Well-nourished and well-hydrated in no acute distress. Vitals Time Taken: 8:50 AM, Height: 67 in, Source: Stated, Weight: 180 lbs, Source: Stated, BMI: 28.2, Temperature: 98.6 F, Pulse: 76 bpm,  Respiratory Rate: 20 breaths/min, Blood Pressure: 171/72 mmHg. Eyes conjunctiva clear no eyelid edema noted. pupils equal round and reactive to light and accommodation. Respiratory normal breathing without difficulty. clear to auscultation bilaterally. Cardiovascular regular rate and rhythm with normal S1, S2. 1+ dorsalis pedis/posterior tibialis pulses. 1+ pitting edema of the bilateral lower extremities. Gastrointestinal (GI) soft, non-tender, non-distended, +BS. no ventral hernia noted. Musculoskeletal normal gait and posture. Patient has [name] left below-knee amputation.Marland Kitchen Psychiatric this patient is able to make decisions and demonstrates good insight into disease process. Alert and Oriented x 3. pleasant and cooperative. General Notes: Upon inspection today patient's wound bed at each location did appear to have necrotic tissue that I think would benefit from the use of Santyl since right now I really think we need to see about getting him an appointment with vascular in order to evaluate for his right ABIs and TBI's to ensure he has sufficient flow to heal these wounds. Obviously I did not want to perform any significant sharp debridement until I confirmed that this is indeed the case. The patient understands and is in agreement with this plan. Integumentary (Hair, Skin) Wound #5 status is Open. Original cause of wound was Pressure Injury. The wound is located on the Right Calcaneus. The wound measures 5cm length x 3cm width x 0.3cm depth; 11.781cm^2 area and 3.534cm^3 volume. There is Fat Layer (Subcutaneous Tissue) Exposed exposed. There is no tunneling or undermining noted. There is [name] large amount of serous drainage noted. The wound margin is epibole. There is no granulation within the wound bed. There is [name] large (67-100%) amount of necrotic tissue  within the wound bed including Eschar and Adherent Slough. [name], [name]. ([id number]) Wound #6 status is Open. Original cause of wound  was Pressure Injury. The wound is located on the Right,Medial Malleolus. The wound measures 1cm length x 1.1cm width x 0.2cm depth; 0.864cm^2 area and 0.173cm^3 volume. There is Fat Layer (Subcutaneous Tissue) Exposed exposed. There is no tunneling or undermining noted. There is [name] large amount of serous drainage noted. The wound margin is flat and intact. There is no granulation within the wound bed. There is [name] large (67-100%) amount of necrotic tissue within the wound bed including Adherent Slough. Wound #7 status is Open. Original cause of wound was Pressure Injury. The wound is located on the Right,Distal,Anterior Lower Leg. The wound measures 1.3cm length x 0.6cm width x 0.1cm depth; 0.613cm^2 area and 0.061cm^3 volume. The wound is limited to skin breakdown. There is no tunneling or undermining noted. There is [name] none present amount of drainage noted. The wound margin is indistinct and nonvisible. There is no granulation within the wound bed. There is [name] large (67- 100%) amount of necrotic tissue within the wound bed including Eschar. 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 There was [name] Chemical/Enzymatic/Mechanical debridement performed by STONE III, HOYT E., PA-C. With the following instrument(s): tongue blade. Agent used was [employer]. [name] time out was conducted at 10:35, prior to the start of the procedure. There was no  bleeding. The procedure was tolerated well. Post Debridement Measurements: 5cm length x 3cm width x 0.3cm depth; 3.534cm^3 volume. Character of Wound/Ulcer Post Debridement is stable. Severity of Tissue Post Debridement is: Fat layer exposed. Post procedure Diagnosis Wound #5: Same as Pre-Procedure Wound #6 Pre-procedure diagnosis of Wound #6 is [name] Diabetic Wound/Ulcer of the Lower Extremity located on the Right,Medial Malleolus .Severity of Tissue Pre Debridement is: Fat layer exposed. There was [name] Chemical/Enzymatic/Mechanical debridement performed by STONE III, HOYT E., PA-C. With the following instrument(s): tongue blade. Agent used was [employer]. [name] time out was conducted at 10:35, prior to the start of the procedure. There was no bleeding. The procedure was tolerated well. Post Debridement Measurements: 1cm length x 1.1cm width x 0.2cm depth; 0.173cm^3 volume. Character of Wound/Ulcer Post Debridement is stable. Severity of Tissue Post Debridement is: Fat layer exposed. Post procedure Diagnosis Wound #6: Same as Pre-Procedure Wound #7 Pre-procedure diagnosis of Wound #7 is [name] Diabetic Wound/Ulcer of the Lower Extremity located on the Right,Distal,Anterior Lower Leg .Severity of Tissue Pre Debridement is: Fat layer exposed. There was [name] Chemical/Enzymatic/Mechanical [name], [name]. ([id number]) debridement performed by STONE III, HOYT E., PA-C. With the following instrument(s): tongue blade. Agent used was [employer]. [name] time out was conducted at 10:35, prior to the start of the procedure. There was no bleeding. The procedure was tolerated well. Post Debridement Measurements: 1.3cm length x 0.6cm width x 0.1cm depth; 0.061cm^3 volume. Character of Wound/Ulcer Post Debridement is stable. Severity of Tissue Post Debridement is: Fat layer exposed. Post procedure Diagnosis Wound #7: Same as Pre-Procedure Plan Wound Cleansing: Wound #  5 Right Calcaneus: Clean wound with Normal Saline. - in  clinic Cleanse wound with mild soap and water Wound #6 Right,Medial Malleolus: Clean wound with Normal Saline. - in clinic Cleanse wound with mild soap and water Wound #7 Right,Distal,Anterior Lower Leg: Clean wound with Normal Saline. - in clinic Cleanse wound with mild soap and water Anesthetic (add to Medication List): Wound #5 Right Calcaneus: Topical Lidocaine 4% cream applied to wound bed prior to debridement (In Clinic Only). - in clinic Wound #6 Right,Medial Malleolus: Topical Lidocaine 4% cream applied to wound bed prior to debridement (In Clinic Only). - in clinic Wound #7 Right,Distal,Anterior Lower Leg: Topical Lidocaine 4% cream applied to wound bed prior to debridement (In Clinic Only). - in clinic Primary Wound Dressing: Wound #5 Right Calcaneus: Santyl Ointment Wound #6 Right,Medial Malleolus: Santyl Ointment Wound #7 Right,Distal,Anterior Lower Leg: Santyl Ointment Secondary Dressing: Wound #5 Right Calcaneus: Saline moistened gauze Boardered Foam Dressing Wound #6 Right,Medial Malleolus: Saline moistened gauze Boardered Foam Dressing Wound #7 Right,Distal,Anterior Lower Leg: Saline moistened gauze Boardered Foam Dressing Dressing Change Frequency: Wound #5 Right Calcaneus: Change dressing every day. Wound #6 Right,Medial Malleolus: Change dressing every day. Wound #7 Right,Distal,Anterior Lower Leg: Change dressing every day. Follow-up Appointments: Wound #5 Right Calcaneus: Return Appointment in 1 week. Wound #6 Right,Medial Malleolus: Return Appointment in 1 week. IRIE, FIORELLO [name]. ([id number]) Wound #7 Right,Distal,Anterior Lower Leg: Return Appointment in 1 week. Services and Therapies ordered were: Arterial Studies- Unilateral - right ABIs and TBIs Radiology ordered were: X-ray, foot - Right foot The following medication(s) was prescribed: Santyl topical 250 unit/gram ointment ointment topical applied nickel thick to the wound bed and then  cover with [name] dressing as directed starting 12/14/2018 1. I am in [name] suggest currently that we go ahead and initiate Santyl to all locations as far as the wounds are concerned his lower extremity. 2. We will cover these with [name] border foam dressing and then subsequently have him change these dressings every other day. 3. I am going to suggest that we go ahead and perform an x-ray of the right foot to ensure there is no signs of underlying infection in the bone. 4. I am also cannot suggest arterial studies on the right with ABI and TBI in order to ensure he has sufficient blood flow to heal these wounds. We will see the patient back for reevaluation in 1 week's time to see where things stand. If anything changes or worsens in the meantime he will contact the office and let me know. Otherwise I am hopeful that with the Santyl will be able to get things under better control he is in agreement with giving this [name] try. Electronic Signature(s) Signed: 12/15/2018 5:34:20 PM By: Worthy Keeler PA-C Entered By: Worthy Keeler on 12/15/2018 17:34:20 [name] ([id number]) -------------------------------------------------------------------------------- ROS/PFSH Details Patient Name: [name]. Date of Service: 12/14/2018 9:45 AM Medical Record Number: [id number] Patient Account Number: [id number] Date of Birth/Sex: [birth year] (84 y.o. M) Treating RN: Harold Barban Primary Care Provider: Serafina Royals Other Clinician: Referring Provider: Referral, Self Treating Provider/Extender: STONE III, HOYT Weeks in Treatment: 0 Information Obtained From Patient Eyes Complaints and Symptoms: Negative for: Dry Eyes; Vision Changes; Glasses / Contacts Medical History: Positive for: Cataracts - removed Negative for: Glaucoma; Optic Neuritis Ear/Nose/Mouth/Throat Complaints and Symptoms: Negative for: Difficult clearing ears; Sinusitis Medical History: Negative for: Chronic sinus  problems/congestion; Middle ear problems Hematologic/Lymphatic Complaints and Symptoms: Negative for: Bleeding / Clotting Disorders; Human Immunodeficiency Virus  Medical History: Negative for: Anemia; Hemophilia; Human Immunodeficiency Virus; Lymphedema; Sickle Cell Disease Respiratory Complaints and Symptoms: Negative for: Chronic or frequent coughs; Shortness of Breath Medical History: Positive for: Sleep Apnea - c-pap Negative for: Aspiration; Asthma; Chronic Obstructive Pulmonary Disease (COPD); Pneumothorax; Tuberculosis Cardiovascular Complaints and Symptoms: Negative for: Chest pain; LE edema Medical History: Positive for: Arrhythmia; Congestive Heart Failure; Hypertension; Peripheral Arterial Disease Negative for: Angina; Coronary Artery Disease; Deep Vein Thrombosis; Hypotension; Myocardial Infarction; Peripheral Venous Disease; Phlebitis; Vasculitis Gastrointestinal Complaints and Symptoms: Negative for: Frequent diarrhea; Nausea; Vomiting [name], [name]. ([id number]) Medical History: Negative for: Cirrhosis ; Colitis; Crohnos; Hepatitis [name]; Hepatitis B; Hepatitis C Endocrine Complaints and Symptoms: Negative for: Hepatitis; Thyroid disease; Polydypsia (Excessive Thirst) Medical History: Positive for: Type II Diabetes Negative for: Type I Diabetes Time with diabetes: 30 years Treated with: Insulin, Oral agents Blood sugar tested every day: No Blood sugar testing results: Breakfast: 145 Genitourinary Complaints and Symptoms: Negative for: Kidney failure/ Dialysis; Incontinence/dribbling Medical History: Negative for: End Stage Renal Disease Immunological Complaints and Symptoms: Negative for: Hives; Itching Medical History: Negative for: Lupus Erythematosus; Raynaudos; Scleroderma Integumentary (Skin) Complaints and Symptoms: Positive for: Wounds - Right Calcaneous; Swelling Negative for: Bleeding or bruising tendency; Breakdown Medical History: Negative  for: History of Burn; History of pressure wounds Musculoskeletal Complaints and Symptoms: Negative for: Muscle Pain; Muscle Weakness Medical History: Positive for: Osteoarthritis Negative for: Gout; Rheumatoid Arthritis; Osteomyelitis Past Medical History Notes: L BKA Neurologic Complaints and Symptoms: Negative for: Numbness/parasthesias; Focal/Weakness [name], [name]. ([id number]) Medical History: Negative for: Dementia; Neuropathy; Quadriplegia; Paraplegia; Seizure Disorder Psychiatric Complaints and Symptoms: Negative for: Anxiety; Claustrophobia Medical History: Negative for: Anorexia/bulimia; Confinement Anxiety Constitutional Symptoms (General Health) Medical History: Past Medical History Notes: HTN; Diabetes Type II, [name]-Fib; Neuropathy Oncologic Medical History: Negative for: Received Chemotherapy; Received Radiation HBO Extended History Items Eyes: Cataracts Immunizations Pneumococcal Vaccine: Received Pneumococcal Vaccination: Yes Implantable Devices None Hospitalization / Surgery History Type of Hospitalization/Surgery 09/22/2018- Right Hip Replacement Family and Social History Cancer: Yes - Siblings; Diabetes: No; Heart Disease: No; Hypertension: Yes - Siblings; Kidney Disease: No; Lung Disease: No; Seizures: No; Stroke: Yes - Siblings; Thyroid Problems: No; Tuberculosis: No; Former smoker; Marital Status - Married; Alcohol Use: Never; Drug Use: No History; Caffeine Use: Daily; Financial Concerns: No; Food, Clothing or Shelter Needs: No; Support System Lacking: No; Transportation Concerns: No Electronic Signature(s) Signed: 12/14/2018 3:44:22 PM By: Harold Barban Signed: 12/16/2018 12:16:19 AM By: Worthy Keeler PA-C Entered By: Harold Barban on 12/14/2018 09:59:05 Barefoot, [name] ([id number]) -------------------------------------------------------------------------------- SuperBill Details Patient Name: [name]. Date of Service:  12/14/2018 Medical Record Number: [id number] Patient Account Number: [id number] Date of Birth/Sex: 03/26/1935 (84 y.o. M) Treating RN: Army Melia Primary Care Provider: Serafina Royals Other Clinician: Referring Provider: Referral, Self Treating Provider/Extender: Melburn Hake, HOYT Weeks in Treatment: 0 Diagnosis Coding ICD-10 Codes Code Description E11.622 Type 2 diabetes mellitus with other skin ulcer L97.412 Non-pressure chronic ulcer of right heel and midfoot with fat layer exposed L97.812 Non-pressure chronic ulcer of other part of right lower leg with fat layer exposed Z89.512 Acquired absence of left leg below knee I50.42 Chronic combined systolic (congestive) and diastolic (congestive) heart failure I10 Essential (primary) hypertension I48.0 Paroxysmal atrial fibrillation Facility Procedures CPT4 Code: 03009233 Description: 00762 - DEBRIDE W/O ANES NON SELECT Modifier: Quantity: 1 CPT4 Code: 26333545 Description: 62563 - DEBRIDE W/O ANES NON SELECT Modifier: Quantity: 1 CPT4 Code: [id number] Description: 76811 - DEBRIDE W/O ANES NON SELECT Modifier: Quantity: 1 Physician  Procedures CPT4 Code Description: [id number] 03795 - WC PHYS LEVEL 4 - EST PT ICD-10 Diagnosis Description E11.622 Type 2 diabetes mellitus with other skin ulcer L97.412 Non-pressure chronic ulcer of right heel and midfoot with fat L97.812 Non-pressure chronic ulcer  of other part of right lower leg w Z89.512 Acquired absence of left leg below knee Modifier: layer exposed ith fat layer expo Quantity: 1 sed Electronic Signature(s) Signed: 12/16/2018 12:16:19 AM By: Worthy Keeler PA-C Entered By: Worthy Keeler on 12/14/2018 16:47:21

## 2018-12-16 NOTE — Progress Notes (Addendum)
JACKSON, COFFIELD ([id number]) Visit Report for 12/14/2018 Allergy List Details Patient Name: [name], [name] Date of Service: 12/14/2018 9:45 AM Medical Record Number: [id number] Patient Account Number: [id number] Date of Birth/Sex: 09/29/1934 (84 y.o. M) Treating RN: Harold Barban Primary Care [name]: Serafina Royals Other Clinician: Referring Mekenzie Modeste: Referral, Self Treating Tima Curet/Extender: STONE III, HOYT Weeks in Treatment: 0 Allergies Active Allergies Iodinated Contrast- Oral and IV Dye latex Allergy Notes Electronic Signature(s) Signed: 12/14/2018 3:44:22 PM By: Harold Barban Entered By: Harold Barban on 12/14/2018 09:55:39 [place], [name] ([id number]) -------------------------------------------------------------------------------- Arrival Information Details Patient Name: Jesse Floor A. Date of Service: 12/14/2018 9:45 AM Medical Record Number: [id number] Patient Account Number: [id number] Date of Birth/Sex: June 09, 1934 (84 y.o. M) Treating RN: Harold Barban Primary Care Rael Tilly: Serafina Royals Other Clinician: Referring Mauria Asquith: Referral, Self Treating Rindi Beechy/Extender: Melburn Hake, HOYT Weeks in Treatment: 0 Visit Information Patient Arrived: Wheel Chair Arrival Time: 09:53 Accompanied By: wife Transfer Assistance: EasyPivot Patient Lift Patient Identification Verified: Yes Secondary Verification Process Yes Completed: Patient Has Alerts: Yes Patient Alerts: Patient on Blood Thinner Xarelto History Since Last Visit Added or deleted any medications: No Any new allergies or adverse reactions: No Had a fall or experienced change in activities of daily living that may affect risk of falls: No Signs or symptoms of abuse/neglect since last visito No Hospitalized since last visit: No Has Dressing in Place as Prescribed: Yes Electronic Signature(s) Signed: 12/14/2018 3:44:22 PM By: Harold Barban Entered By: Harold Barban on 12/14/2018  10:08:24 [name] ([id number]) -------------------------------------------------------------------------------- Encounter Discharge Information Details Patient Name: Jesse Floor A. Date of Service: 12/14/2018 9:45 AM Medical Record Number: [id number] Patient Account Number: [id number] Date of Birth/Sex: 08-Aug-1934 (84 y.o. M) Treating RN: Army Melia Primary Care Tempest Frankland: Serafina Royals Other Clinician: Referring Sander Speckman: Referral, Self Treating Mayzee Reichenbach/Extender: Melburn Hake, HOYT Weeks in Treatment: 0 Encounter Discharge Information Items Post Procedure Vitals Discharge Condition: Stable Temperature (F): 98.6 Ambulatory Status: Wheelchair Pulse (bpm): 76 Discharge Destination: Home Respiratory Rate (breaths/min): 18 Transportation: Private Auto Blood Pressure (mmHg): 171/72 Accompanied By: wife Schedule Follow-up Appointment: Yes Clinical Summary of Care: Electronic Signature(s) Signed: 12/14/2018 3:30:04 PM By: Army Melia Entered By: Army Melia on 12/14/2018 10:56:46 [name], [name] -------------------------------------------------------------------------------- Lower Extremity Assessment Details Patient Name: Jesse Floor A. Date of Service: 12/14/2018 9:45 AM Medical Record Number: [id number] Patient Account Number: [id number] Date of Birth/Sex: 07-15-1934 (84 y.o. M) Treating RN: Harold Barban Primary Care Qianna Clagett: Serafina Royals Other Clinician: Referring Eliese Kerwood: Referral, Self Treating Jacklynn Dehaas/Extender: STONE III, HOYT Weeks in Treatment: 0 Edema Assessment Assessed: [Left: No] [Right: No] [Left: Edema] [Right: :] Calf Left: Right: Point of Measurement: 32 cm From Medial Instep cm 34 cm Ankle Left: Right: Point of Measurement: 12 cm From Medial Instep cm 23.2 cm Vascular Assessment Pulses: Dorsalis Pedis Palpable: [Right:Yes] Posterior Tibial Palpable: [Right:No] Electronic Signature(s) Signed: 12/14/2018 3:44:22  PM By: Harold Barban Entered By: Harold Barban on 12/14/2018 10:07:35 [name] ([id number]) -------------------------------------------------------------------------------- Multi Wound Chart Details Patient Name: Jesse Floor A. Date of Service: 12/14/2018 9:45 AM Medical Record Number: [id number] Patient Account Number: [id number] Date of Birth/Sex: 1934-09-19 (84 y.o. 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 Lower Extremity Lower Extremity Comorbid History: Cataracts, Sleep Apnea, Cataracts, Sleep Apnea, Cataracts, Sleep Apnea, Arrhythmia, Congestive Heart Arrhythmia, Congestive Heart Arrhythmia, Congestive Heart Failure, Hypertension, Failure, Hypertension, Failure, Hypertension, Peripheral Arterial Disease, Peripheral Arterial Disease, Peripheral Arterial Disease, Type II Diabetes, Type II Diabetes, Type II Diabetes, Osteoarthritis Osteoarthritis Osteoarthritis Date Acquired: 09/22/2018 09/22/2018 09/22/2018 Weeks of Treatment: 0 0 0 Wound Status: Open Open Open Measurements L x W x D 5x3x0.3 1x1.1x0.2 1.3x0.6x0.1 (cm) Area (cm) : 11.781 0.864 0.613 Volume (cm) : 3.534 0.173 0.061 % Reduction in Area: 0.00% 0.00% 0.00% % Reduction in Volume: 0.00% 0.00% 0.00% Classification: Grade 1 Grade 1 Grade 1 Exudate Amount: Large Large None Present Exudate Type: Serous Serous  N/A Exudate Color: amber amber N/A Wound Margin: Epibole Flat and Intact Indistinct, nonvisible Granulation Amount: None Present (0%) None Present (0%) None Present (0%) Necrotic Amount: Large (67-100%) Large (67-100%) Large (67-100%) Necrotic Tissue: Eschar, Adherent [place] Exposed Structures: [name], [name] ([id number]) Fat Layer (Subcutaneous Fat Layer (Subcutaneous Fascia: No Tissue) Exposed: Yes Tissue) Exposed: Yes Fat Layer (Subcutaneous Fascia: No Fascia: No Tissue) Exposed: No Tendon: No Tendon: No Tendon: No Muscle: No Muscle: No Muscle: No Joint: No Joint: No Joint: No Bone: No Bone: No Bone: No Limited to Skin Breakdown Epithelialization: None None None Treatment Notes Electronic Signature(s) Signed: 12/14/2018 3:30:04 PM By: Army Melia Entered By: Army Melia on 12/14/2018 10:24:36 [name] ([id number]) -------------------------------------------------------------------------------- [place] Details Patient Name: Jesse Floor A. Date of Service: 12/14/2018 9:45 AM Medical Record Number: [id number] Patient Account Number: [id number] Date of Birth/Sex: Oct 11, 1934 (84 y.o. M) Treating RN: Army Melia Primary Care Shermaine Rivet: Serafina Royals Other Clinician: Referring Nakeia Calvi: Referral, Self Treating Embree Brawley/Extender: Melburn Hake, HOYT Weeks in Treatment: 0 Active Inactive Electronic Signature(s) Signed: 01/05/2019 1:46:47 PM By: Gretta Cool, BSN, RN, CWS, Kim RN, BSN Signed: 01/19/2019 9:36:50 AM By: Army Melia Previous Signature: 12/14/2018 3:30:04 PM Version By: Army Melia Entered By: Gretta Cool BSN, RN, CWS, Kim on 01/05/2019 13:46:46 [place], [name] ([id number]) -------------------------------------------------------------------------------- Pain Assessment Details Patient Name: Jesse Floor A. Date of Service: 12/14/2018 9:45 AM Medical Record Number: [id number] Patient Account Number:  [id number] Date of Birth/Sex: Nov 06, 1934 (84 y.o. M) Treating RN: Harold Barban Primary Care Liandra Mendia: Serafina Royals Other Clinician: Referring Arieh Bogue: Referral, Self Treating Leveta Wahab/Extender: STONE III, HOYT Weeks in Treatment: 0 Active Problems Location of Pain Severity and Description of Pain Patient Has Paino Yes Site Locations Rate the pain. Current Pain Level: 8 Pain Management and Medication Current Pain Management: Electronic Signature(s) Signed: 12/14/2018 3:44:22 PM By: Harold Barban Entered By: Harold Barban on 12/14/2018 09:54:42 [place], [name] ([id number]) -------------------------------------------------------------------------------- Patient/Caregiver Education Details Patient Name: Jesse Floor A. Date of Service: 12/14/2018 9:45 AM Medical Record Number: [id number] Patient Account Number: [id number] Date of Birth/Gender: [birth year] (84 y.o. M) Treating RN: Army Melia Primary Care Physician: Serafina Royals Other Clinician: Referring Physician: Referral, Self Treating Physician/Extender: Melburn Hake, HOYT Weeks in Treatment: 0 Education Assessment Education Provided To: Patient Education Topics Provided Pain: Handouts: A Guide to Pain Control Methods: Demonstration, Explain/Verbal Responses: State content correctly Welcome To [facility] [place]: Handouts: Welcome To [facility] [place] Methods: Demonstration, Explain/Verbal Responses: State content correctly Wound/Skin Impairment: Handouts: Caring for Your Ulcer Methods: Demonstration, Explain/Verbal Responses: State content correctly Electronic Signature(s) Signed: 12/14/2018 3:30:04 PM By: Army Melia Entered By: Army Melia on 12/14/2018 10:37:41  [name], [name] ([id number]) -------------------------------------------------------------------------------- Wound Assessment Details Patient Name: [name], [name] Date of Service: 12/14/2018 9:45 AM Medical Record Number:  [id number] Patient Account Number: [id number] Date of Birth/Sex: 01-Nov-1934 (84 y.o. M) Treating RN: Harold Barban Primary Care Dalvin Clipper: Serafina Royals Other Clinician: Referring Jerrilynn Mikowski: Referral, Self Treating Asmara Backs/Extender: STONE III, HOYT Weeks in Treatment: 0 Wound Status Wound Number: 5 Primary Diabetic Wound/Ulcer of the Lower Extremity Etiology: Wound Location: Right Calcaneus Wound Open Wounding Event: Pressure Injury Status: Date Acquired: 09/22/2018 Comorbid Cataracts, Sleep Apnea, Arrhythmia, Congestive Weeks Of Treatment: 0 History: Heart Failure, Hypertension, Peripheral Arterial Clustered Wound: No Disease, Type II Diabetes, Osteoarthritis Photos Wound Measurements Length: (cm) 5 % Reduction i Width: (cm) 3 % Reduction i Depth: (cm) 0.3 Epithelializa Area: (cm) 11.781 Tunneling: Volume: (cm) 3.534 Undermining: n Area: 0% n Volume: 0% tion: None No No Wound Description Classification: Grade 1 Foul Odor Aft Wound Margin: Epibole Slough/Fibrin Exudate Amount: Large Exudate Type: Serous Exudate Color: amber er Cleansing: No o Yes Wound Bed Granulation Amount: None Present (0%) Exposed Structure Necrotic Amount: Large (67-100%) Fascia Exposed: No Necrotic Quality: Eschar, Adherent Slough Fat Layer (Subcutaneous Tissue) Exposed: Yes Tendon Exposed: No Muscle Exposed: No Joint Exposed: No Bone Exposed: No Electronic Signature(s) [name], [name] ([id number]) Signed: 12/14/2018 10:07:55 AM By: Montey Hora Signed: 12/14/2018 3:44:22 PM By: Harold Barban Entered By: Montey Hora on 12/14/2018 10:07:54 [name], [name] ([id number]) -------------------------------------------------------------------------------- Wound Assessment Details Patient Name: Jesse Floor A. Date of Service: 12/14/2018 9:45 AM Medical Record Number: [id number] Patient Account Number: [id number] Date of Birth/Sex: 1935-04-17 (84 y.o. M) Treating RN: Harold Barban Primary Care [place] Schreiner: Serafina Royals Other Clinician: Referring Guadalupe Nickless: Referral, Self Treating Xiara Knisley/Extender: STONE III, HOYT Weeks in Treatment: 0 Wound Status Wound Number: 6 Primary Diabetic Wound/Ulcer of the Lower Extremity Etiology: Wound Location: Right Malleolus - Medial Wound Open Wounding Event: Pressure Injury Status: Date Acquired: 09/22/2018 Comorbid Cataracts, Sleep Apnea, Arrhythmia, Congestive Weeks Of Treatment: 0 History: Heart Failure, Hypertension, Peripheral Arterial Clustered Wound: No Disease, Type II Diabetes, Osteoarthritis Photos Wound Measurements Length: (cm) 1 % Reduction i Width: (cm) 1.1 % Reduction i Depth: (cm) 0.2 Epithelializa Area: (cm) 0.864 Tunneling: Volume: (cm) 0.173 Undermining: n Area: 0% n Volume: 0% tion: None No No Wound Description Classification: Grade 1 Foul Odor Aft Wound Margin: Flat and Intact Slough/Fibrin Exudate Amount: Large Exudate Type: Serous Exudate Color: amber er Cleansing: No o Yes Wound Bed Granulation Amount: None Present (0%) Exposed Structure Necrotic Amount: Large (67-100%) Fascia Exposed: No Necrotic Quality: Adherent Slough Fat Layer (Subcutaneous Tissue) Exposed: Yes Tendon Exposed: No Muscle Exposed: No Joint Exposed: No Bone Exposed: No Electronic Signature(s) [name], [name] ([id number]) Signed: 12/14/2018 10:09:35 AM By: Montey Hora Signed: 12/14/2018 3:44:22 PM By: Harold Barban Entered By: Montey Hora on 12/14/2018 10:09:35 [name] ([id number]) -------------------------------------------------------------------------------- Wound Assessment Details Patient Name: Jesse Floor A. Date of Service: 12/14/2018 9:45 AM Medical Record Number: [id number] Patient Account Number: [id number] Date of Birth/Sex: 1934/06/06 (84 y.o. M) Treating RN: Harold Barban Primary Care Karlo Goeden: Serafina Royals Other Clinician: Referring Keionte Swicegood: Referral,  Self Treating Adetokunbo Mccadden/Extender: STONE III, HOYT Weeks in Treatment: 0 Wound Status Wound Number: 7 Primary Diabetic Wound/Ulcer of the Lower Extremity Etiology: Wound Location: Right Lower Leg - Anterior, Distal Wound Open Wounding Event: Pressure Injury Status: Date Acquired: 09/22/2018 Comorbid Cataracts, Sleep Apnea, Arrhythmia, Congestive Weeks Of Treatment: 0 History: Heart Failure, Hypertension, Peripheral Arterial Clustered Wound: No Disease, Type II Diabetes, Osteoarthritis Photos Wound Measurements Length: (cm)  1.3 Width: (cm) 0.6 Depth: (cm) 0.1 Area: (cm) 0.613 Volume: (cm) 0.061 % Reduction in Area: 0% % Reduction in Volume: 0% Epithelialization: None Tunneling: No Undermining: No Wound Description Classification: Grade 1 Foul Od Wound Margin: Indistinct, nonvisible Slough/ Exudate Amount: None Present or After Cleansing: No Fibrino No Wound Bed Granulation Amount: None Present (0%) Exposed Structure Necrotic Amount: Large (67-100%) Fascia Exposed: No Necrotic Quality: Eschar Fat Layer (Subcutaneous Tissue) Exposed: No Tendon Exposed: No Muscle Exposed: No Joint Exposed: No Bone Exposed: No Limited to Skin Breakdown Electronic Signature(s) Signed: 12/14/2018 10:10:25 AM By: [name], [name] ([id number]) Signed: 12/14/2018 3:44:22 PM By: Harold Barban Entered By: Montey Hora on 12/14/2018 10:10:25 [name] ([id number]) -------------------------------------------------------------------------------- [name] Details Patient Name: Jesse Floor A. Date of Service: 12/14/2018 9:45 AM Medical Record Number: [id number] Patient Account Number: [id number] Date of Birth/Sex: June 10, 1934 (85 y.o. M) Treating RN: Harold Barban Primary Care Victoriah Wilds: Serafina Royals Other Clinician: Referring Savvas Roper: Referral, Self Treating Tericka Devincenzi/Extender: STONE III, HOYT Weeks in Treatment: 0 Vital Signs Time Taken: 08:50 Temperature  (F): 98.6 Height (in): 67 Pulse (bpm): 76 Source: Stated Respiratory Rate (breaths/min): 20 Weight (lbs): 180 Blood Pressure (mmHg): 171/72 Source: Stated Reference Range: 80 - 120 mg / dl Body Mass Index (BMI): 28.2 Electronic Signature(s) Signed: 12/14/2018 3:44:22 PM By: Harold Barban Entered By: Harold Barban on 12/14/2018 09:55:14

## 2018-12-17 MED ORDER — CEFAZOLIN SODIUM-DEXTROSE 1-4 GM/50ML-% IV SOLN
1.0000 g | Freq: Once | INTRAVENOUS | Status: AC
  Administered 2018-12-18: 1 g via INTRAVENOUS

## 2018-12-18 ENCOUNTER — Encounter: Payer: Self-pay | Admitting: *Deleted

## 2018-12-18 ENCOUNTER — Ambulatory Visit
Admission: RE | Admit: 2018-12-18 | Discharge: 2018-12-18 | Disposition: A | Discharge status: Home / Self Care | Payer: Medicare Other | Source: Home / Self Care | Attending: Vascular Surgery | Admitting: Vascular Surgery

## 2018-12-18 ENCOUNTER — Other Ambulatory Visit (INDEPENDENT_AMBULATORY_CARE_PROVIDER_SITE_OTHER): Payer: Self-pay | Admitting: Vascular Surgery

## 2018-12-18 ENCOUNTER — Other Ambulatory Visit: Payer: Self-pay

## 2018-12-18 ENCOUNTER — Encounter
Admission: RE | Disposition: A | Discharge status: Home / Self Care | Payer: Self-pay | Source: Home / Self Care | Attending: Vascular Surgery

## 2018-12-18 DIAGNOSIS — L97909 Non-pressure chronic ulcer of unspecified part of unspecified lower leg with unspecified severity: Secondary | ICD-10-CM

## 2018-12-18 DIAGNOSIS — Z7901 Long term (current) use of anticoagulants: Secondary | ICD-10-CM | POA: Insufficient documentation

## 2018-12-18 DIAGNOSIS — N189 Chronic kidney disease, unspecified: Secondary | ICD-10-CM | POA: Insufficient documentation

## 2018-12-18 DIAGNOSIS — I4891 Unspecified atrial fibrillation: Secondary | ICD-10-CM | POA: Insufficient documentation

## 2018-12-18 DIAGNOSIS — E1151 Type 2 diabetes mellitus with diabetic peripheral angiopathy without gangrene: Secondary | ICD-10-CM | POA: Insufficient documentation

## 2018-12-18 DIAGNOSIS — I509 Heart failure, unspecified: Secondary | ICD-10-CM | POA: Insufficient documentation

## 2018-12-18 DIAGNOSIS — I70238 Atherosclerosis of native arteries of right leg with ulceration of other part of lower right leg: Secondary | ICD-10-CM | POA: Insufficient documentation

## 2018-12-18 DIAGNOSIS — I130 Hypertensive heart and chronic kidney disease with heart failure and stage 1 through stage 4 chronic kidney disease, or unspecified chronic kidney disease: Secondary | ICD-10-CM | POA: Insufficient documentation

## 2018-12-18 DIAGNOSIS — L97919 Non-pressure chronic ulcer of unspecified part of right lower leg with unspecified severity: Secondary | ICD-10-CM

## 2018-12-18 DIAGNOSIS — E785 Hyperlipidemia, unspecified: Secondary | ICD-10-CM | POA: Insufficient documentation

## 2018-12-18 DIAGNOSIS — Z8546 Personal history of malignant neoplasm of prostate: Secondary | ICD-10-CM | POA: Insufficient documentation

## 2018-12-18 DIAGNOSIS — L97419 Non-pressure chronic ulcer of right heel and midfoot with unspecified severity: Secondary | ICD-10-CM | POA: Insufficient documentation

## 2018-12-18 DIAGNOSIS — I70234 Atherosclerosis of native arteries of right leg with ulceration of heel and midfoot: Secondary | ICD-10-CM

## 2018-12-18 DIAGNOSIS — Z79899 Other long term (current) drug therapy: Secondary | ICD-10-CM | POA: Insufficient documentation

## 2018-12-18 DIAGNOSIS — L97219 Non-pressure chronic ulcer of right calf with unspecified severity: Secondary | ICD-10-CM | POA: Insufficient documentation

## 2018-12-18 DIAGNOSIS — G4730 Sleep apnea, unspecified: Secondary | ICD-10-CM | POA: Insufficient documentation

## 2018-12-18 DIAGNOSIS — I70299 Other atherosclerosis of native arteries of extremities, unspecified extremity: Secondary | ICD-10-CM

## 2018-12-18 DIAGNOSIS — I7092 Chronic total occlusion of artery of the extremities: Secondary | ICD-10-CM

## 2018-12-18 DIAGNOSIS — Z794 Long term (current) use of insulin: Secondary | ICD-10-CM | POA: Insufficient documentation

## 2018-12-18 DIAGNOSIS — E1122 Type 2 diabetes mellitus with diabetic chronic kidney disease: Secondary | ICD-10-CM | POA: Insufficient documentation

## 2018-12-18 HISTORY — PX: LOWER EXTREMITY ANGIOGRAPHY: CATH118251

## 2018-12-18 LAB — BASIC METABOLIC PANEL
Anion gap: 11 (ref 5–15)
BUN: 18 mg/dL (ref 8–23)
CO2: 21 mmol/L — ABNORMAL LOW (ref 22–32)
Calcium: 8.5 mg/dL — ABNORMAL LOW (ref 8.9–10.3)
Chloride: 103 mmol/L (ref 98–111)
Creatinine, Ser: 1.28 mg/dL — ABNORMAL HIGH (ref 0.61–1.24)
GFR calc Af Amer: >60 mL/min (ref >60)
GFR calc non Af Amer: 52 mL/min — ABNORMAL LOW (ref >60)
Glucose, Bld: 323 mg/dL — ABNORMAL HIGH (ref 70–99)
Potassium: 4.1 mmol/L (ref 3.5–5.1)
Sodium: 135 mmol/L (ref 135–145)

## 2018-12-18 LAB — GLUCOSE, CAPILLARY
Glucose-Capillary: 299 mg/dL — ABNORMAL HIGH (ref 70–99)
Glucose-Capillary: 306 mg/dL — ABNORMAL HIGH (ref 70–99)

## 2018-12-18 SURGERY — LOWER EXTREMITY ANGIOGRAPHY
Anesthesia: Moderate Sedation | Laterality: Right

## 2018-12-18 MED ORDER — ONDANSETRON HCL 4 MG/2ML IJ SOLN: 4.0000 mg | Freq: Four times a day (QID) | INTRAMUSCULAR | Status: DC | PRN

## 2018-12-18 MED ORDER — MIDAZOLAM HCL 2 MG/2ML IJ SOLN
INTRAMUSCULAR | Status: DC | PRN
  Administered 2018-12-18: 2 mg via INTRAVENOUS
  Administered 2018-12-18: 1 mg via INTRAVENOUS

## 2018-12-18 MED ORDER — LIDOCAINE-EPINEPHRINE (PF) 1 %-1:200000 IJ SOLN
INTRAMUSCULAR | Status: AC
  Filled 2018-12-18: qty 30

## 2018-12-18 MED ORDER — HYDROMORPHONE HCL 1 MG/ML IJ SOLN
1.0000 mg | Freq: Once | INTRAMUSCULAR | Status: AC | PRN
  Administered 2018-12-18: 11:00:00 1 mg via INTRAVENOUS

## 2018-12-18 MED ORDER — SODIUM CHLORIDE 0.9 % IV SOLN: INTRAVENOUS | Status: DC

## 2018-12-18 MED ORDER — LABETALOL HCL 5 MG/ML IV SOLN: 10.0000 mg | INTRAVENOUS | Status: DC | PRN

## 2018-12-18 MED ORDER — FENTANYL CITRATE (PF) 100 MCG/2ML IJ SOLN
INTRAMUSCULAR | Status: DC | PRN
  Administered 2018-12-18: 25 ug via INTRAVENOUS
  Administered 2018-12-18: 50 ug via INTRAVENOUS

## 2018-12-18 MED ORDER — ACETAMINOPHEN 325 MG PO TABS: 650.0000 mg | ORAL_TABLET | ORAL | Status: DC | PRN

## 2018-12-18 MED ORDER — HEPARIN SODIUM (PORCINE) 1000 UNIT/ML IJ SOLN
INTRAMUSCULAR | Status: DC | PRN
  Administered 2018-12-18: 5000 [IU] via INTRAVENOUS

## 2018-12-18 MED ORDER — IODIXANOL 320 MG/ML IV SOLN
INTRAVENOUS | Status: DC | PRN
  Administered 2018-12-18: 105 mL via INTRA_ARTERIAL

## 2018-12-18 MED ORDER — LABETALOL HCL 5 MG/ML IV SOLN
INTRAVENOUS | Status: DC | PRN
  Administered 2018-12-18: 10 mg via INTRAVENOUS

## 2018-12-18 MED ORDER — SODIUM CHLORIDE 0.9 % IV SOLN: 125.0000 mg | Freq: Once | INTRAVENOUS | Status: DC | PRN

## 2018-12-18 MED ORDER — DIPHENHYDRAMINE HCL 50 MG/ML IJ SOLN: 50.0000 mg | Freq: Once | INTRAMUSCULAR | Status: DC | PRN

## 2018-12-18 MED ORDER — SODIUM CHLORIDE 0.9 % IV SOLN: 250.0000 mL | INTRAVENOUS | Status: DC | PRN

## 2018-12-18 MED ORDER — METHYLPREDNISOLONE SODIUM SUCC 125 MG IJ SOLR
125.0000 mg | Freq: Once | INTRAMUSCULAR | Status: AC | PRN
  Administered 2018-12-18: 125 mg via INTRAVENOUS

## 2018-12-18 MED ORDER — DIPHENHYDRAMINE HCL 50 MG/ML IJ SOLN
50.0000 mg | Freq: Once | INTRAMUSCULAR | Status: AC | PRN
  Administered 2018-12-18: 50 mg via INTRAVENOUS

## 2018-12-18 MED ORDER — MIDAZOLAM HCL 5 MG/5ML IJ SOLN
INTRAMUSCULAR | Status: AC
  Filled 2018-12-18: qty 5

## 2018-12-18 MED ORDER — MIDAZOLAM HCL 2 MG/ML PO SYRP: 8.0000 mg | ORAL_SOLUTION | Freq: Once | ORAL | Status: DC | PRN

## 2018-12-18 MED ORDER — SODIUM CHLORIDE 0.9% FLUSH: 3.0000 mL | INTRAVENOUS | Status: DC | PRN

## 2018-12-18 MED ORDER — HYDROMORPHONE HCL 1 MG/ML IJ SOLN
INTRAMUSCULAR | Status: AC
  Filled 2018-12-18: qty 1

## 2018-12-18 MED ORDER — SODIUM CHLORIDE 0.9% FLUSH: 3.0000 mL | Freq: Two times a day (BID) | INTRAVENOUS | Status: DC

## 2018-12-18 MED ORDER — SODIUM CHLORIDE 0.9 % IV SOLN
INTRAVENOUS | Status: DC
  Administered 2018-12-18: 11:00:00 via INTRAVENOUS

## 2018-12-18 MED ORDER — ASPIRIN EC 81 MG PO TBEC
DELAYED_RELEASE_TABLET | ORAL | Status: AC
  Administered 2018-12-18: 81 mg via ORAL
  Filled 2018-12-18: qty 1

## 2018-12-18 MED ORDER — ASPIRIN EC 81 MG PO TBEC: 81.0000 mg | DELAYED_RELEASE_TABLET | Freq: Every day | ORAL | 2 refills | Status: DC

## 2018-12-18 MED ORDER — HEPARIN SODIUM (PORCINE) 1000 UNIT/ML IJ SOLN
INTRAMUSCULAR | Status: AC
  Filled 2018-12-18: qty 1

## 2018-12-18 MED ORDER — FAMOTIDINE 10 MG PO TABS: 40.0000 mg | ORAL_TABLET | Freq: Once | ORAL | Status: DC | PRN

## 2018-12-18 MED ORDER — METHYLPREDNISOLONE SODIUM SUCC 125 MG IJ SOLR
INTRAMUSCULAR | Status: AC
  Administered 2018-12-18: 125 mg via INTRAVENOUS
  Filled 2018-12-18: qty 2

## 2018-12-18 MED ORDER — LABETALOL HCL 5 MG/ML IV SOLN
INTRAVENOUS | Status: AC
  Filled 2018-12-18: qty 4

## 2018-12-18 MED ORDER — FENTANYL CITRATE (PF) 100 MCG/2ML IJ SOLN
INTRAMUSCULAR | Status: AC
  Filled 2018-12-18: qty 2

## 2018-12-18 MED ORDER — DIPHENHYDRAMINE HCL 50 MG/ML IJ SOLN
INTRAMUSCULAR | Status: AC
  Administered 2018-12-18: 50 mg via INTRAVENOUS
  Filled 2018-12-18: qty 1

## 2018-12-18 MED ORDER — ASPIRIN EC 81 MG PO TBEC
81.0000 mg | DELAYED_RELEASE_TABLET | Freq: Every day | ORAL | Status: DC
  Administered 2018-12-18: 81 mg via ORAL

## 2018-12-18 MED ORDER — FAMOTIDINE IN NACL 20-0.9 MG/50ML-% IV SOLN
20.0000 mg | Freq: Once | INTRAVENOUS | Status: AC
  Administered 2018-12-18: 20 mg via INTRAVENOUS
  Filled 2018-12-18: qty 50

## 2018-12-18 MED ORDER — HYDRALAZINE HCL 20 MG/ML IJ SOLN: 5.0000 mg | INTRAMUSCULAR | Status: DC | PRN

## 2018-12-18 MED ORDER — CEFAZOLIN SODIUM-DEXTROSE 1-4 GM/50ML-% IV SOLN
INTRAVENOUS | Status: AC
  Administered 2018-12-18: 14:00:00 1 g via INTRAVENOUS
  Filled 2018-12-18: qty 50

## 2018-12-18 SURGICAL SUPPLY — 29 items
BALLN DORADO 5X200X135 (BALLOONS) ×2
BALLN LUTONIX 018 5X100X130 (BALLOONS) ×2
BALLN LUTONIX 018 5X300X130 (BALLOONS) ×2
BALLN ULTRVRSE 018 2.5X150X150 (BALLOONS) ×2
BALLN ULTRVRSE 2.5X150X150 (BALLOONS) ×2
BALLN ULTRVRSE 3X150X150 (BALLOONS) ×2
BALLN ULTRVRSE 3X300X150 (BALLOONS) ×1
BALLN ULTRVRSE 3X300X150 OTW (BALLOONS) ×1
BALLOON DORADO 5X200X135 (BALLOONS) IMPLANT
BALLOON LUTONIX 018 5X100X130 (BALLOONS) IMPLANT
BALLOON LUTONIX 018 5X300X130 (BALLOONS) IMPLANT
BALLOON ULTRVRSE 2.5X150X150 (BALLOONS) IMPLANT
BALLOON ULTRVRSE 3X150X150 (BALLOONS) IMPLANT
BALLOON ULTRVRSE 3X300X150 OTW (BALLOONS) IMPLANT
BALLOON ULTRVS 018 2.5X150X150 (BALLOONS) IMPLANT
CATH BEACON 5 .038 100 VERT TP (CATHETERS) ×1 IMPLANT
CATH PIG 70CM (CATHETERS) ×1 IMPLANT
DEVICE PRESTO INFLATION (MISCELLANEOUS) ×1 IMPLANT
DEVICE STARCLOSE SE CLOSURE (Vascular Products) ×1 IMPLANT
GLIDEWIRE ADV .035X260CM (WIRE) ×1 IMPLANT
PACK ANGIOGRAPHY (CUSTOM PROCEDURE TRAY) ×2 IMPLANT
SHEATH ANL2 6FRX45 HC (SHEATH) ×1 IMPLANT
SHEATH BRITE TIP 5FRX11 (SHEATH) ×1 IMPLANT
STENT VIABAHN 6X250X120 (Permanent Stent) ×1 IMPLANT
SYR MEDRAD MARK 7 150ML (SYRINGE) ×1 IMPLANT
TUBING CONTRAST HIGH PRESS 72 (TUBING) ×1 IMPLANT
WIRE G V18X300CM (WIRE) ×2 IMPLANT
WIRE J 3MM .035X145CM (WIRE) ×1 IMPLANT
WIRE RUNTHROUGH .014X300CM (WIRE) ×1 IMPLANT

## 2018-12-18 NOTE — Op Note (Signed)
[facility]  Percutaneous Study/Intervention Procedural Note   Date of Surgery: 12/18/2018  Surgeon(s):DEW,JASON    Assistants:none  Pre-operative Diagnosis: PAD with ulceration RLE  Post-operative diagnosis:  Same  Procedure(s) Performed:             1.  Ultrasound guidance for vascular access left femoral artery             2.  Catheter placement into right common femoral artery from left femoral approach             3.  Aortogram and selective right lower extremity angiogram             4.  Percutaneous transluminal angioplasty of right anterior tibial artery with 3 mm diameter angioplasty balloon             5.   Percutaneous transluminal angioplasty of the right SFA and proximal popliteal artery with a 5 mm diameter by 30 and a 5 mm diameter by 10 cm length Lutonix drug-coated angioplasty balloon  6.  Percutaneous transluminal angioplasty of the right tibioperoneal trunk and proximal peroneal artery with 3 mm diameter angioplasty balloon  7.  Viabahn stent placement to the right mid to distal SFA and proximal popliteal artery with a 6 mm diameter by 25 cm length stent             8.  StarClose closure device left femoral artery  EBL: 10 cc  Contrast: 105 cc  Fluoro Time: 15.9 minutes  Moderate Conscious Sedation Time: approximately 70 minutes using 3 mg of Versed and 75 mcg of Fentanyl              Indications:  Patient is a 84 y.o.male with multiple ulcerations on the right leg including a large heel ulceration. The patient has noninvasive study showing significantly reduced flow with monophasic waveforms and markedly reduced ABI. The patient is brought in for angiography for further evaluation and potential treatment.  Due to the limb threatening nature of the situation, angiogram was performed for attempted limb salvage. The patient is aware that if the procedure fails, amputation would be expected.  The patient also understands that even with  successful revascularization, amputation may still be required due to the severity of the situation.  Risks and benefits are discussed and informed consent is obtained.   Procedure:  The patient was identified and appropriate procedural time out was performed.  The patient was then placed supine on the table and prepped and draped in the usual sterile fashion. Moderate conscious sedation was administered during a face to face encounter with the patient throughout the procedure with my supervision of the RN administering medicines and monitoring the patient's vital signs, pulse oximetry, telemetry and mental status throughout from the start of the procedure until the patient was taken to the recovery room. Ultrasound was used to evaluate the left common femoral artery.  It was patent .  A digital ultrasound image was acquired.  A Seldinger needle was used to access the left common femoral artery under direct ultrasound guidance and a permanent image was performed.  A 0.035 J wire was advanced without resistance and a 5Fr sheath was placed.  Pigtail catheter was placed into the aorta and an AP aortogram was performed. This demonstrated normal renal arteries and normal aorta and iliac segments without significant stenosis. I then crossed the aortic bifurcation and advanced to the right femoral head. Selective right lower extremity angiogram was then performed. This  demonstrated normal common femoral artery.  The proximal SFA had about a 70% stenosis.  The vessel then normalized for several centimeters and then there was a long diffusely diseased SFA with multiple areas of nearly occlusive stenosis.  The vessel normalized in the mid popliteal artery and there was a typical tibial trifurcation with about an 80% stenosis of the proximal anterior tibial artery and a greater than 90% stenosis in the proximal peroneal artery.  The posterior tibial artery was chronically occluded.  The anterior tibial and peroneal arteries  were continuous distally after the proximal disease. It was felt that it was in the patient's best interest to proceed with intervention after these images to avoid a second procedure and a larger amount of contrast and fluoroscopy based off of the findings from the initial angiogram. The patient was systemically heparinized and a 6 [place] Ansell sheath was then placed over the [employer] wire. I then used a Kumpe catheter and the advantage wire to navigate through the SFA and popliteal lesions and get down into the anterior tibial artery.  Selective imaging of the AT demonstrated the proximal stenosis that I then crossed with the V 18 wire parking the wire distally.  I then ballooned the anterior tibial artery with a 3 mm diameter angioplasty balloon inflated to 10 atm for 1 minute with about a 20% residual stenosis after angioplasty.  I used a 3 mm balloon to predilate the SFA lesions but then definitively treated the entire SFA and the proximal portion of the popliteal artery with a 5 mm diameter by 30 cm length Lutonix drug-coated angioplasty balloon and a 5 mm diameter by 10 cm likely tonics drug-coated angioplasty balloon both inflations were 10 to 12 atm for a minute but there remained multiple areas of greater than 50% residual stenosis in the mid to distal SFA and proximal popliteal artery.  The proximal SFA had only about a 10% stenosis.  A 6 mm diameter by 25 cm length via bond stent was then deployed from the mid SFA down to the proximal popliteal artery.  This was postdilated with a 5 mm balloon with less than 10% residual stenosis.  I then turned my attention to the peroneal lesion.  I was able to get the V 18 wire across this lesion without difficulty.  A 3 mm diameter angioplasty balloon was inflated to 12 atm and the tibioperoneal trunk and proximal peroneal artery with less than 30% residual stenosis.  In the proximal to mid peroneal artery there remained a calcific lesion that we could not  cross with a balloon even with good wire access.  At this point his perfusion had been markedly improved. I elected to terminate the procedure. The sheath was removed and StarClose closure device was deployed in the left femoral artery with excellent hemostatic result. The patient was taken to the recovery room in stable condition having tolerated the procedure well.  Findings:               Aortogram:  Normal renal arteries bilaterally.  Normal aorta and iliac arteries without significant stenosis             Right lower Extremity:  Relatively normal common femoral artery.  The proximal SFA had about a 70% stenosis.  The vessel then normalized for several centimeters and then there was a long diffusely diseased SFA with multiple areas of nearly occlusive stenosis.  The vessel normalized in the mid popliteal artery and there was a typical tibial  trifurcation with about an 80% stenosis of the proximal anterior tibial artery and a greater than 90% stenosis in the proximal peroneal artery.  The posterior tibial artery was chronically occluded.  The anterior tibial and peroneal arteries were continuous distally after the proximal disease.   Disposition: Patient was taken to the recovery room in stable condition having tolerated the procedure well.  Complications: None  Leotis Pain 12/18/2018 3:07 PM   This note was created with Dragon Medical transcription system. Any errors in dictation are purely unintentional.

## 2018-12-18 NOTE — H&P (Signed)
[facility] History & Physical Update  The patient was interviewed and re-examined.  The patient's previous History and Physical has been reviewed and is unchanged.  There is no change in the plan of care. We plan to proceed with the scheduled procedure.  Leotis Pain, MD  12/18/2018, 9:40 AM

## 2018-12-18 NOTE — Progress Notes (Signed)
Pt Son in law Jimmie called via phone.Per Jimmie, Pt Wife has mild dementia.  Son in law updated on Pt status, including delay of procedure due to Pt eating this Am. Horris Latino is also not sure of Patient current medications. Will update Son in law once Pt has returned from procedure regarding recovery time.

## 2018-12-18 NOTE — Progress Notes (Addendum)
Left Heel wound dressing saturated with yellow drainage.Old dressing removed new wet to dry dressing placed. Heel wound with dark black eschar covering wound bed and two small wounds present on anterior ankle. Pt also has unstageable sore on bottom assessed at 1300 with urine incontinence clean up. MD updated.

## 2018-12-18 NOTE — Progress Notes (Signed)
Pt unsure of what meds he is still taking since d/c from rehab 3 weeks ago. He did take his insulin last night and this morning. Pt wife called and she is also unsure of what meds if any he took recently. She does think he had his blood thinner Eliquis last night. Per Wife the Patient also had a light breakfast of cereal this am at 0730. Dr. Lucky Cowboy notified of recent meal and questionable meds including Eliquis last night.

## 2018-12-18 NOTE — Progress Notes (Signed)
P.A Hezzie Bump notified of Pt pain at this time 10/10 in right leg. Dr. Lucky Cowboy currently in Surgery. No orders at this time. Will monitor closely.

## 2018-12-19 ENCOUNTER — Encounter: Payer: Self-pay | Admitting: Vascular Surgery

## 2018-12-20 ENCOUNTER — Inpatient Hospital Stay
Admission: EM | Admit: 2018-12-20 | Discharge: 2019-01-05 | DRG: 853 | Disposition: A | Discharge status: Skilled Nursing Facility | Payer: Medicare Other | Attending: Internal Medicine | Admitting: Internal Medicine

## 2018-12-20 ENCOUNTER — Encounter: Payer: Self-pay | Admitting: Emergency Medicine

## 2018-12-20 ENCOUNTER — Emergency Department: Payer: Medicare Other

## 2018-12-20 DIAGNOSIS — L89619 Pressure ulcer of right heel, unspecified stage: Secondary | ICD-10-CM

## 2018-12-20 DIAGNOSIS — E1165 Type 2 diabetes mellitus with hyperglycemia: Secondary | ICD-10-CM | POA: Diagnosis present

## 2018-12-20 DIAGNOSIS — L89519 Pressure ulcer of right ankle, unspecified stage: Secondary | ICD-10-CM | POA: Diagnosis present

## 2018-12-20 DIAGNOSIS — I96 Gangrene, not elsewhere classified: Secondary | ICD-10-CM | POA: Diagnosis not present

## 2018-12-20 DIAGNOSIS — R6520 Severe sepsis without septic shock: Secondary | ICD-10-CM | POA: Diagnosis present

## 2018-12-20 DIAGNOSIS — K635 Polyp of colon: Secondary | ICD-10-CM | POA: Diagnosis present

## 2018-12-20 DIAGNOSIS — M6518 Other infective (teno)synovitis, other site: Secondary | ICD-10-CM | POA: Diagnosis not present

## 2018-12-20 DIAGNOSIS — Z89512 Acquired absence of left leg below knee: Secondary | ICD-10-CM

## 2018-12-20 DIAGNOSIS — I255 Ischemic cardiomyopathy: Secondary | ICD-10-CM | POA: Diagnosis present

## 2018-12-20 DIAGNOSIS — I739 Peripheral vascular disease, unspecified: Secondary | ICD-10-CM | POA: Diagnosis not present

## 2018-12-20 DIAGNOSIS — G4733 Obstructive sleep apnea (adult) (pediatric): Secondary | ICD-10-CM | POA: Diagnosis present

## 2018-12-20 DIAGNOSIS — Z993 Dependence on wheelchair: Secondary | ICD-10-CM

## 2018-12-20 DIAGNOSIS — Z79899 Other long term (current) drug therapy: Secondary | ICD-10-CM

## 2018-12-20 DIAGNOSIS — Z87891 Personal history of nicotine dependence: Secondary | ICD-10-CM | POA: Diagnosis not present

## 2018-12-20 DIAGNOSIS — L97414 Non-pressure chronic ulcer of right heel and midfoot with necrosis of bone: Secondary | ICD-10-CM | POA: Diagnosis present

## 2018-12-20 DIAGNOSIS — B965 Pseudomonas (aeruginosa) (mallei) (pseudomallei) as the cause of diseases classified elsewhere: Secondary | ICD-10-CM | POA: Diagnosis not present

## 2018-12-20 DIAGNOSIS — K640 First degree hemorrhoids: Secondary | ICD-10-CM | POA: Diagnosis present

## 2018-12-20 DIAGNOSIS — K2971 Gastritis, unspecified, with bleeding: Secondary | ICD-10-CM | POA: Diagnosis present

## 2018-12-20 DIAGNOSIS — I4891 Unspecified atrial fibrillation: Secondary | ICD-10-CM | POA: Diagnosis not present

## 2018-12-20 DIAGNOSIS — L97419 Non-pressure chronic ulcer of right heel and midfoot with unspecified severity: Secondary | ICD-10-CM | POA: Diagnosis not present

## 2018-12-20 DIAGNOSIS — Z96641 Presence of right artificial hip joint: Secondary | ICD-10-CM | POA: Diagnosis present

## 2018-12-20 DIAGNOSIS — J9600 Acute respiratory failure, unspecified whether with hypoxia or hypercapnia: Secondary | ICD-10-CM | POA: Diagnosis present

## 2018-12-20 DIAGNOSIS — I2510 Atherosclerotic heart disease of native coronary artery without angina pectoris: Secondary | ICD-10-CM | POA: Diagnosis present

## 2018-12-20 DIAGNOSIS — E1169 Type 2 diabetes mellitus with other specified complication: Secondary | ICD-10-CM | POA: Diagnosis present

## 2018-12-20 DIAGNOSIS — N183 Chronic kidney disease, stage 3 (moderate): Secondary | ICD-10-CM | POA: Diagnosis present

## 2018-12-20 DIAGNOSIS — I480 Paroxysmal atrial fibrillation: Secondary | ICD-10-CM | POA: Diagnosis present

## 2018-12-20 DIAGNOSIS — L89152 Pressure ulcer of sacral region, stage 2: Secondary | ICD-10-CM | POA: Diagnosis not present

## 2018-12-20 DIAGNOSIS — A4189 Other specified sepsis: Secondary | ICD-10-CM | POA: Diagnosis present

## 2018-12-20 DIAGNOSIS — Z95820 Peripheral vascular angioplasty status with implants and grafts: Secondary | ICD-10-CM

## 2018-12-20 DIAGNOSIS — N179 Acute kidney failure, unspecified: Secondary | ICD-10-CM | POA: Diagnosis present

## 2018-12-20 DIAGNOSIS — Z91040 Latex allergy status: Secondary | ICD-10-CM

## 2018-12-20 DIAGNOSIS — B9620 Unspecified Escherichia coli [E. coli] as the cause of diseases classified elsewhere: Secondary | ICD-10-CM | POA: Diagnosis not present

## 2018-12-20 DIAGNOSIS — I70261 Atherosclerosis of native arteries of extremities with gangrene, right leg: Secondary | ICD-10-CM | POA: Diagnosis present

## 2018-12-20 DIAGNOSIS — Z8673 Personal history of transient ischemic attack (TIA), and cerebral infarction without residual deficits: Secondary | ICD-10-CM

## 2018-12-20 DIAGNOSIS — J9601 Acute respiratory failure with hypoxia: Secondary | ICD-10-CM

## 2018-12-20 DIAGNOSIS — I5023 Acute on chronic systolic (congestive) heart failure: Secondary | ICD-10-CM | POA: Diagnosis present

## 2018-12-20 DIAGNOSIS — A4102 Sepsis due to Methicillin resistant Staphylococcus aureus: Secondary | ICD-10-CM | POA: Diagnosis present

## 2018-12-20 DIAGNOSIS — Z9079 Acquired absence of other genital organ(s): Secondary | ICD-10-CM

## 2018-12-20 DIAGNOSIS — Z9862 Peripheral vascular angioplasty status: Secondary | ICD-10-CM

## 2018-12-20 DIAGNOSIS — E11621 Type 2 diabetes mellitus with foot ulcer: Secondary | ICD-10-CM

## 2018-12-20 DIAGNOSIS — L97418 Non-pressure chronic ulcer of right heel and midfoot with other specified severity: Secondary | ICD-10-CM | POA: Diagnosis not present

## 2018-12-20 DIAGNOSIS — D649 Anemia, unspecified: Secondary | ICD-10-CM | POA: Diagnosis not present

## 2018-12-20 DIAGNOSIS — Z8249 Family history of ischemic heart disease and other diseases of the circulatory system: Secondary | ICD-10-CM

## 2018-12-20 DIAGNOSIS — I130 Hypertensive heart and chronic kidney disease with heart failure and stage 1 through stage 4 chronic kidney disease, or unspecified chronic kidney disease: Secondary | ICD-10-CM | POA: Diagnosis present

## 2018-12-20 DIAGNOSIS — Z20828 Contact with and (suspected) exposure to other viral communicable diseases: Secondary | ICD-10-CM | POA: Diagnosis present

## 2018-12-20 DIAGNOSIS — G9341 Metabolic encephalopathy: Secondary | ICD-10-CM | POA: Diagnosis present

## 2018-12-20 DIAGNOSIS — R7881 Bacteremia: Secondary | ICD-10-CM | POA: Diagnosis not present

## 2018-12-20 DIAGNOSIS — D72829 Elevated white blood cell count, unspecified: Secondary | ICD-10-CM | POA: Diagnosis not present

## 2018-12-20 DIAGNOSIS — K5900 Constipation, unspecified: Secondary | ICD-10-CM | POA: Diagnosis present

## 2018-12-20 DIAGNOSIS — D62 Acute posthemorrhagic anemia: Secondary | ICD-10-CM | POA: Diagnosis not present

## 2018-12-20 DIAGNOSIS — Z833 Family history of diabetes mellitus: Secondary | ICD-10-CM

## 2018-12-20 DIAGNOSIS — D500 Iron deficiency anemia secondary to blood loss (chronic): Secondary | ICD-10-CM | POA: Diagnosis not present

## 2018-12-20 DIAGNOSIS — L089 Local infection of the skin and subcutaneous tissue, unspecified: Secondary | ICD-10-CM | POA: Diagnosis not present

## 2018-12-20 DIAGNOSIS — B9562 Methicillin resistant Staphylococcus aureus infection as the cause of diseases classified elsewhere: Secondary | ICD-10-CM | POA: Diagnosis not present

## 2018-12-20 DIAGNOSIS — M869 Osteomyelitis, unspecified: Secondary | ICD-10-CM | POA: Diagnosis present

## 2018-12-20 DIAGNOSIS — Z8546 Personal history of malignant neoplasm of prostate: Secondary | ICD-10-CM

## 2018-12-20 DIAGNOSIS — I509 Heart failure, unspecified: Secondary | ICD-10-CM

## 2018-12-20 DIAGNOSIS — I252 Old myocardial infarction: Secondary | ICD-10-CM

## 2018-12-20 DIAGNOSIS — E785 Hyperlipidemia, unspecified: Secondary | ICD-10-CM | POA: Diagnosis present

## 2018-12-20 DIAGNOSIS — A4151 Sepsis due to Escherichia coli [E. coli]: Secondary | ICD-10-CM | POA: Diagnosis present

## 2018-12-20 DIAGNOSIS — Z7901 Long term (current) use of anticoagulants: Secondary | ICD-10-CM | POA: Diagnosis not present

## 2018-12-20 DIAGNOSIS — F05 Delirium due to known physiological condition: Secondary | ICD-10-CM | POA: Diagnosis present

## 2018-12-20 DIAGNOSIS — B964 Proteus (mirabilis) (morganii) as the cause of diseases classified elsewhere: Secondary | ICD-10-CM | POA: Diagnosis not present

## 2018-12-20 DIAGNOSIS — E1122 Type 2 diabetes mellitus with diabetic chronic kidney disease: Secondary | ICD-10-CM | POA: Diagnosis present

## 2018-12-20 DIAGNOSIS — R509 Fever, unspecified: Secondary | ICD-10-CM | POA: Diagnosis not present

## 2018-12-20 DIAGNOSIS — Z888 Allergy status to other drugs, medicaments and biological substances status: Secondary | ICD-10-CM

## 2018-12-20 DIAGNOSIS — E039 Hypothyroidism, unspecified: Secondary | ICD-10-CM | POA: Diagnosis present

## 2018-12-20 DIAGNOSIS — Z1612 Extended spectrum beta lactamase (ESBL) resistance: Secondary | ICD-10-CM | POA: Diagnosis not present

## 2018-12-20 DIAGNOSIS — A4152 Sepsis due to Pseudomonas: Secondary | ICD-10-CM | POA: Diagnosis present

## 2018-12-20 DIAGNOSIS — D124 Benign neoplasm of descending colon: Secondary | ICD-10-CM | POA: Diagnosis not present

## 2018-12-20 DIAGNOSIS — Z89511 Acquired absence of right leg below knee: Secondary | ICD-10-CM | POA: Diagnosis not present

## 2018-12-20 DIAGNOSIS — R001 Bradycardia, unspecified: Secondary | ICD-10-CM | POA: Diagnosis not present

## 2018-12-20 DIAGNOSIS — Z91041 Radiographic dye allergy status: Secondary | ICD-10-CM

## 2018-12-20 DIAGNOSIS — Z794 Long term (current) use of insulin: Secondary | ICD-10-CM

## 2018-12-20 DIAGNOSIS — E1152 Type 2 diabetes mellitus with diabetic peripheral angiopathy with gangrene: Secondary | ICD-10-CM

## 2018-12-20 DIAGNOSIS — D638 Anemia in other chronic diseases classified elsewhere: Secondary | ICD-10-CM | POA: Diagnosis present

## 2018-12-20 DIAGNOSIS — K449 Diaphragmatic hernia without obstruction or gangrene: Secondary | ICD-10-CM | POA: Diagnosis present

## 2018-12-20 DIAGNOSIS — Z7982 Long term (current) use of aspirin: Secondary | ICD-10-CM

## 2018-12-20 DIAGNOSIS — I2582 Chronic total occlusion of coronary artery: Secondary | ICD-10-CM | POA: Diagnosis present

## 2018-12-20 LAB — SARS CORONAVIRUS 2 BY RT PCR (HOSPITAL ORDER, PERFORMED IN ~~LOC~~ HOSPITAL LAB): SARS Coronavirus 2: NEGATIVE

## 2018-12-20 LAB — CBC WITH DIFFERENTIAL/PLATELET
Abs Immature Granulocytes: 0.29 10*3/uL — ABNORMAL HIGH (ref 0.00–0.07)
Basophils Absolute: 0.1 10*3/uL (ref 0.0–0.1)
Basophils Relative: 0 %
Eosinophils Absolute: 0 10*3/uL (ref 0.0–0.5)
Eosinophils Relative: 0 %
HCT: 35.4 % — ABNORMAL LOW (ref 39.0–52.0)
Hemoglobin: 11.6 g/dL — ABNORMAL LOW (ref 13.0–17.0)
Immature Granulocytes: 1 %
Lymphocytes Relative: 4 %
Lymphs Abs: 1.1 10*3/uL (ref 0.7–4.0)
MCH: 29 pg (ref 26.0–34.0)
MCHC: 32.8 g/dL (ref 30.0–36.0)
MCV: 88.5 fL (ref 80.0–100.0)
Monocytes Absolute: 1.1 10*3/uL — ABNORMAL HIGH (ref 0.1–1.0)
Monocytes Relative: 4 %
Neutro Abs: 25.8 10*3/uL — ABNORMAL HIGH (ref 1.7–7.7)
Neutrophils Relative %: 91 %
Platelets: 367 10*3/uL (ref 150–400)
RBC: 4 MIL/uL — ABNORMAL LOW (ref 4.22–5.81)
RDW: 14.2 % (ref 11.5–15.5)
Smear Review: NORMAL
WBC: 28.3 10*3/uL — ABNORMAL HIGH (ref 4.0–10.5)
nRBC: 0 % (ref 0.0–0.2)

## 2018-12-20 LAB — BLOOD GAS, VENOUS
Acid-Base Excess: 0.5 mmol/L (ref 0.0–2.0)
Bicarbonate: 26 mmol/L (ref 20.0–28.0)
FIO2: 0.45
O2 Saturation: 30.7 %
Patient temperature: 37
pCO2, Ven: 44 mmHg (ref 44.0–60.0)
pH, Ven: 7.38 (ref 7.250–7.430)
pO2, Ven: <31 mmHg — CL (ref 32.0–45.0)

## 2018-12-20 LAB — COMPREHENSIVE METABOLIC PANEL
ALT: 22 U/L (ref 0–44)
AST: 23 U/L (ref 15–41)
Albumin: 3.1 g/dL — ABNORMAL LOW (ref 3.5–5.0)
Alkaline Phosphatase: 168 U/L — ABNORMAL HIGH (ref 38–126)
Anion gap: 12 (ref 5–15)
BUN: 28 mg/dL — ABNORMAL HIGH (ref 8–23)
CO2: 23 mmol/L (ref 22–32)
Calcium: 8.7 mg/dL — ABNORMAL LOW (ref 8.9–10.3)
Chloride: 99 mmol/L (ref 98–111)
Creatinine, Ser: 1.33 mg/dL — ABNORMAL HIGH (ref 0.61–1.24)
GFR calc Af Amer: 58 mL/min — ABNORMAL LOW (ref >60)
GFR calc non Af Amer: 50 mL/min — ABNORMAL LOW (ref >60)
Glucose, Bld: 521 mg/dL (ref 70–99)
Potassium: 4 mmol/L (ref 3.5–5.1)
Sodium: 134 mmol/L — ABNORMAL LOW (ref 135–145)
Total Bilirubin: 1.2 mg/dL (ref 0.3–1.2)
Total Protein: 7.8 g/dL (ref 6.5–8.1)

## 2018-12-20 LAB — TROPONIN I (HIGH SENSITIVITY): Troponin I (High Sensitivity): 234 ng/L (ref <18)

## 2018-12-20 LAB — GLUCOSE, CAPILLARY
Glucose-Capillary: 436 mg/dL — ABNORMAL HIGH (ref 70–99)
Glucose-Capillary: 527 mg/dL (ref 70–99)
Glucose-Capillary: 464 mg/dL — ABNORMAL HIGH (ref 70–99)
Glucose-Capillary: 354 mg/dL — ABNORMAL HIGH (ref 70–99)
Glucose-Capillary: 254 mg/dL — ABNORMAL HIGH (ref 70–99)
Glucose-Capillary: 190 mg/dL — ABNORMAL HIGH (ref 70–99)
Glucose-Capillary: 169 mg/dL — ABNORMAL HIGH (ref 70–99)
Glucose-Capillary: 158 mg/dL — ABNORMAL HIGH (ref 70–99)
Glucose-Capillary: 141 mg/dL — ABNORMAL HIGH (ref 70–99)
Glucose-Capillary: 139 mg/dL — ABNORMAL HIGH (ref 70–99)

## 2018-12-20 LAB — MRSA PCR SCREENING: MRSA by PCR: NEGATIVE

## 2018-12-20 LAB — APTT: aPTT: 28 seconds (ref 24–36)

## 2018-12-20 LAB — HEMOGLOBIN A1C
Hgb A1c MFr Bld: 8.9 % — ABNORMAL HIGH (ref 4.8–5.6)
Mean Plasma Glucose: 208.73 mg/dL

## 2018-12-20 LAB — PROCALCITONIN: Procalcitonin: 0.16 ng/mL

## 2018-12-20 LAB — LACTIC ACID, PLASMA
Lactic Acid, Venous: 2.8 mmol/L (ref 0.5–1.9)
Lactic Acid, Venous: 2.4 mmol/L (ref 0.5–1.9)

## 2018-12-20 LAB — BRAIN NATRIURETIC PEPTIDE: B Natriuretic Peptide: 1436 pg/mL — ABNORMAL HIGH (ref 0.0–100.0)

## 2018-12-20 MED ORDER — DEXTROSE 50 % IV SOLN: 25.0000 mL | INTRAVENOUS | Status: DC | PRN

## 2018-12-20 MED ORDER — HYDROCODONE-ACETAMINOPHEN 5-325 MG PO TABS
1.0000 | ORAL_TABLET | Freq: Four times a day (QID) | ORAL | Status: DC | PRN
  Administered 2018-12-21 – 2018-12-25 (×8): 2 via ORAL
  Administered 2018-12-25: 1 via ORAL
  Administered 2018-12-26 – 2018-12-28 (×3): 2 via ORAL
  Administered 2018-12-29 – 2018-12-31 (×3): 1 via ORAL
  Administered 2018-12-31 – 2019-01-01 (×2): 2 via ORAL
  Filled 2018-12-20: qty 2
  Filled 2018-12-20: qty 1
  Filled 2018-12-20 (×4): qty 2
  Filled 2018-12-20 (×2): qty 1
  Filled 2018-12-20 (×2): qty 2
  Filled 2018-12-20: qty 1
  Filled 2018-12-20 (×6): qty 2

## 2018-12-20 MED ORDER — ONDANSETRON HCL 4 MG PO TABS: 4.0000 mg | ORAL_TABLET | Freq: Four times a day (QID) | ORAL | Status: DC | PRN

## 2018-12-20 MED ORDER — INSULIN REGULAR BOLUS VIA INFUSION
0.0000 [IU] | Freq: Three times a day (TID) | INTRAVENOUS | Status: DC
  Administered 2018-12-20: 10 [IU] via INTRAVENOUS
  Filled 2018-12-20: qty 10

## 2018-12-20 MED ORDER — HEPARIN (PORCINE) 25000 UT/250ML-% IV SOLN
1400.0000 [IU]/h | INTRAVENOUS | Status: DC
  Administered 2018-12-20: 1200 [IU]/h via INTRAVENOUS
  Administered 2018-12-21: 1300 [IU]/h via INTRAVENOUS
  Administered 2018-12-22: 1400 [IU]/h via INTRAVENOUS
  Filled 2018-12-20 (×3): qty 250

## 2018-12-20 MED ORDER — METOPROLOL TARTRATE 25 MG PO TABS
25.0000 mg | ORAL_TABLET | Freq: Three times a day (TID) | ORAL | Status: DC
  Administered 2018-12-20 – 2018-12-26 (×7): 25 mg via ORAL
  Filled 2018-12-20 (×12): qty 1

## 2018-12-20 MED ORDER — PIPERACILLIN-TAZOBACTAM 3.375 G IVPB 30 MIN: 3.3750 g | Freq: Three times a day (TID) | INTRAVENOUS | Status: DC

## 2018-12-20 MED ORDER — INSULIN GLARGINE 100 UNIT/ML ~~LOC~~ SOLN
25.0000 [IU] | Freq: Every day | SUBCUTANEOUS | Status: DC
  Filled 2018-12-20: qty 0.25

## 2018-12-20 MED ORDER — MORPHINE SULFATE (PF) 2 MG/ML IV SOLN
1.0000 mg | INTRAVENOUS | Status: DC | PRN
  Administered 2018-12-20 – 2018-12-21 (×3): 2 mg via INTRAVENOUS
  Filled 2018-12-20 (×6): qty 1

## 2018-12-20 MED ORDER — ONDANSETRON HCL 4 MG/2ML IJ SOLN
4.0000 mg | Freq: Four times a day (QID) | INTRAMUSCULAR | Status: DC | PRN
  Administered 2018-12-27: 13:00:00 4 mg via INTRAVENOUS

## 2018-12-20 MED ORDER — ACETAMINOPHEN 325 MG PO TABS
650.0000 mg | ORAL_TABLET | Freq: Four times a day (QID) | ORAL | Status: DC | PRN
  Administered 2018-12-29 – 2019-01-05 (×6): 650 mg via ORAL
  Filled 2018-12-20 (×6): qty 2

## 2018-12-20 MED ORDER — FUROSEMIDE 10 MG/ML IJ SOLN
40.0000 mg | Freq: Once | INTRAMUSCULAR | Status: AC
  Administered 2018-12-20: 07:00:00 40 mg via INTRAVENOUS
  Filled 2018-12-20: qty 4

## 2018-12-20 MED ORDER — CHLORHEXIDINE GLUCONATE CLOTH 2 % EX PADS
6.0000 | MEDICATED_PAD | Freq: Every day | CUTANEOUS | Status: DC
  Administered 2018-12-21 – 2019-01-03 (×12): 6 via TOPICAL

## 2018-12-20 MED ORDER — FERROUS SULFATE 325 (65 FE) MG PO TABS
325.0000 mg | ORAL_TABLET | Freq: Every day | ORAL | Status: DC
  Administered 2018-12-22 – 2019-01-03 (×9): 325 mg via ORAL
  Filled 2018-12-20 (×10): qty 1

## 2018-12-20 MED ORDER — INSULIN GLARGINE 100 UNIT/ML ~~LOC~~ SOLN
12.0000 [IU] | Freq: Every day | SUBCUTANEOUS | Status: DC
  Administered 2018-12-21: 12 [IU] via SUBCUTANEOUS
  Filled 2018-12-20 (×2): qty 0.12

## 2018-12-20 MED ORDER — AMIODARONE HCL 200 MG PO TABS
200.0000 mg | ORAL_TABLET | Freq: Every day | ORAL | Status: DC
  Administered 2018-12-20 – 2019-01-05 (×15): 200 mg via ORAL
  Filled 2018-12-20 (×15): qty 1

## 2018-12-20 MED ORDER — METHOCARBAMOL 500 MG PO TABS
500.0000 mg | ORAL_TABLET | Freq: Four times a day (QID) | ORAL | Status: DC | PRN
  Administered 2018-12-22 – 2019-01-04 (×10): 500 mg via ORAL
  Filled 2018-12-20 (×10): qty 1

## 2018-12-20 MED ORDER — ATORVASTATIN CALCIUM 20 MG PO TABS
80.0000 mg | ORAL_TABLET | Freq: Every evening | ORAL | Status: DC
  Administered 2018-12-20 – 2019-01-04 (×14): 80 mg via ORAL
  Filled 2018-12-20 (×14): qty 4

## 2018-12-20 MED ORDER — INSULIN ASPART 100 UNIT/ML ~~LOC~~ SOLN: 0.0000 [IU] | Freq: Every day | SUBCUTANEOUS | Status: DC

## 2018-12-20 MED ORDER — FUROSEMIDE 10 MG/ML IJ SOLN
40.0000 mg | Freq: Two times a day (BID) | INTRAMUSCULAR | Status: DC
  Administered 2018-12-20 (×2): 40 mg via INTRAVENOUS
  Filled 2018-12-20 (×2): qty 4

## 2018-12-20 MED ORDER — DEXTROSE-NACL 5-0.45 % IV SOLN: INTRAVENOUS | Status: DC

## 2018-12-20 MED ORDER — SODIUM CHLORIDE 0.9 % IV SOLN: INTRAVENOUS | Status: DC

## 2018-12-20 MED ORDER — INSULIN ASPART 100 UNIT/ML ~~LOC~~ SOLN
10.0000 [IU] | Freq: Once | SUBCUTANEOUS | Status: AC
  Administered 2018-12-20: 10 [IU] via INTRAVENOUS
  Filled 2018-12-20: qty 1

## 2018-12-20 MED ORDER — NITROGLYCERIN 0.4 MG SL SUBL
0.4000 mg | SUBLINGUAL_TABLET | SUBLINGUAL | Status: DC | PRN
  Administered 2019-01-05: 0.4 mg via SUBLINGUAL
  Filled 2018-12-20: qty 1

## 2018-12-20 MED ORDER — INSULIN ASPART 100 UNIT/ML ~~LOC~~ SOLN
0.0000 [IU] | Freq: Three times a day (TID) | SUBCUTANEOUS | Status: DC
  Administered 2018-12-20: 15 [IU] via SUBCUTANEOUS
  Filled 2018-12-20: qty 1

## 2018-12-20 MED ORDER — INSULIN ASPART 100 UNIT/ML ~~LOC~~ SOLN
0.0000 [IU] | SUBCUTANEOUS | Status: DC
  Administered 2018-12-21: 8 [IU] via SUBCUTANEOUS
  Administered 2018-12-21: 15 [IU] via SUBCUTANEOUS
  Administered 2018-12-21 (×2): 3 [IU] via SUBCUTANEOUS
  Administered 2018-12-21 – 2018-12-22 (×3): 2 [IU] via SUBCUTANEOUS
  Administered 2018-12-22: 5 [IU] via SUBCUTANEOUS
  Administered 2018-12-22: 2 [IU] via SUBCUTANEOUS
  Administered 2018-12-23: 3 [IU] via SUBCUTANEOUS
  Administered 2018-12-23 (×2): 11 [IU] via SUBCUTANEOUS
  Administered 2018-12-24 (×2): 8 [IU] via SUBCUTANEOUS
  Administered 2018-12-24: 5 [IU] via SUBCUTANEOUS
  Administered 2018-12-25: 11 [IU] via SUBCUTANEOUS
  Administered 2018-12-25: 5 [IU] via SUBCUTANEOUS
  Administered 2018-12-25 (×3): 8 [IU] via SUBCUTANEOUS
  Administered 2018-12-26 (×2): 3 [IU] via SUBCUTANEOUS
  Administered 2018-12-26: 2 [IU] via SUBCUTANEOUS
  Administered 2018-12-26: 3 [IU] via SUBCUTANEOUS
  Administered 2018-12-26: 5 [IU] via SUBCUTANEOUS
  Administered 2018-12-26: 2 [IU] via SUBCUTANEOUS
  Administered 2018-12-27: 5 [IU] via SUBCUTANEOUS
  Administered 2018-12-27 (×2): 2 [IU] via SUBCUTANEOUS
  Administered 2018-12-28: 5 [IU] via SUBCUTANEOUS
  Administered 2018-12-28: 3 [IU] via SUBCUTANEOUS
  Administered 2018-12-28: 2 [IU] via SUBCUTANEOUS
  Administered 2018-12-28: 3 [IU] via SUBCUTANEOUS
  Administered 2018-12-28: 5 [IU] via SUBCUTANEOUS
  Administered 2018-12-28 – 2018-12-29 (×3): 3 [IU] via SUBCUTANEOUS
  Administered 2018-12-29: 5 [IU] via SUBCUTANEOUS
  Administered 2018-12-29: 3 [IU] via SUBCUTANEOUS
  Administered 2018-12-29 (×2): 5 [IU] via SUBCUTANEOUS
  Administered 2018-12-29: 2 [IU] via SUBCUTANEOUS
  Administered 2018-12-30: 3 [IU] via SUBCUTANEOUS
  Administered 2018-12-30: 5 [IU] via SUBCUTANEOUS
  Administered 2018-12-30: 3 [IU] via SUBCUTANEOUS
  Administered 2018-12-30: 5 [IU] via SUBCUTANEOUS
  Administered 2018-12-30 – 2018-12-31 (×2): 3 [IU] via SUBCUTANEOUS
  Administered 2018-12-31 (×2): 5 [IU] via SUBCUTANEOUS
  Administered 2018-12-31: 2 [IU] via SUBCUTANEOUS
  Administered 2018-12-31: 3 [IU] via SUBCUTANEOUS
  Administered 2019-01-01: 8 [IU] via SUBCUTANEOUS
  Administered 2019-01-01: 2 [IU] via SUBCUTANEOUS
  Administered 2019-01-01: 8 [IU] via SUBCUTANEOUS
  Administered 2019-01-01: 2 [IU] via SUBCUTANEOUS
  Administered 2019-01-01: 3 [IU] via SUBCUTANEOUS
  Administered 2019-01-01: 5 [IU] via SUBCUTANEOUS
  Administered 2019-01-02: 3 [IU] via SUBCUTANEOUS
  Administered 2019-01-03 (×2): 5 [IU] via SUBCUTANEOUS
  Administered 2019-01-03: 3 [IU] via SUBCUTANEOUS
  Administered 2019-01-03 – 2019-01-05 (×5): 2 [IU] via SUBCUTANEOUS
  Filled 2018-12-20 (×67): qty 1

## 2018-12-20 MED ORDER — INSULIN REGULAR(HUMAN) IN NACL 100-0.9 UT/100ML-% IV SOLN
INTRAVENOUS | Status: DC
  Administered 2018-12-20: 4.7 [IU]/h via INTRAVENOUS
  Filled 2018-12-20: qty 100

## 2018-12-20 MED ORDER — ACETAMINOPHEN 650 MG RE SUPP: 650.0000 mg | Freq: Four times a day (QID) | RECTAL | Status: DC | PRN

## 2018-12-20 MED ORDER — PIPERACILLIN-TAZOBACTAM 3.375 G IVPB
3.3750 g | Freq: Three times a day (TID) | INTRAVENOUS | Status: DC
  Administered 2018-12-20 – 2018-12-26 (×17): 3.375 g via INTRAVENOUS
  Filled 2018-12-20 (×17): qty 50

## 2018-12-20 MED ORDER — DILTIAZEM HCL ER COATED BEADS 120 MG PO CP24
120.0000 mg | ORAL_CAPSULE | Freq: Every day | ORAL | Status: DC
  Administered 2018-12-20 – 2019-01-05 (×14): 120 mg via ORAL
  Filled 2018-12-20 (×18): qty 1

## 2018-12-20 MED ORDER — ASPIRIN EC 81 MG PO TBEC
81.0000 mg | DELAYED_RELEASE_TABLET | Freq: Every day | ORAL | Status: DC
  Administered 2018-12-20 – 2018-12-31 (×10): 81 mg via ORAL
  Filled 2018-12-20 (×10): qty 1

## 2018-12-20 NOTE — Progress Notes (Signed)
ANTICOAGULATION CONSULT NOTE  Pharmacy Consult for Heparin Drip Management  Indication: atrial fibrillation  Allergies  Allergen Reactions  . Contrast Media [Iodinated Diagnostic Agents] Shortness Of Breath    SOB  . Iohexol Shortness Of Breath     Desc: Respiratory Distress, Laryngedema, diaphoresis, Onset Date: [id number]   . Metrizamide Shortness Of Breath    SOB SOB SOB   . Latex Rash    Patient Measurements: Height: 5\' 7"  (170.2 cm) Weight: 176 lb 2.4 oz (79.9 kg) IBW/kg (Calculated) : 66.1  Vital Signs: Temp: 98.3 F (36.8 C) (07/29 1045) Temp Source: Axillary (07/29 1045) BP: 135/63 (07/29 1600) Pulse Rate: 67 (07/29 1600)  Labs: Recent Labs    12/18/18 1022 12/20/18 0602  HGB  --  11.6*  HCT  --  35.4*  PLT  --  367  CREATININE 1.28* 1.33*  TROPONINIHS  --  234*    Estimated Creatinine Clearance: 44.1 mL/min (A) (by C-G formula based on SCr of 1.33 mg/dL (H)).   Medications:  Scheduled:  . amiodarone  200 mg Oral Daily  . aspirin EC  81 mg Oral Daily  . atorvastatin  80 mg Oral QPM  . diltiazem  120 mg Oral Daily  . [START ON 12/21/2018] ferrous sulfate  325 mg Oral Q breakfast  . furosemide  40 mg Intravenous Q12H  . insulin regular  0-10 Units Intravenous TID WC  . metoprolol tartrate  25 mg Oral TID   Infusions:  . sodium chloride Stopped (12/20/18 1335)  . dextrose 5 % and 0.45% NaCl Stopped (12/20/18 1134)  . insulin 3.9 Units/hr (12/20/18 1631)    Assessment: Patient takes rivaroxaban 15mg  with supper. Last dose 7/28 at 1700.   Goal of Therapy:  Heparin level 0.3-0.7 units/ml aPTT 66-102 seconds Monitor platelets by anticoagulation protocol: Yes   Plan:  Will start heparin drip at 1200 units/hr. After discussion with team, will defer bolus - patient is not in active atrial fibrillation and with probable upcoming procedures. Will manage via aPTTs over initial 24 hour period and plan to obtain anti-Xa level on 7/31 with am labs.    Pharmacy will continue to monitor and adjust per consult.    Geovana Gebel L 12/20/2018,4:36 PM

## 2018-12-20 NOTE — Consult Note (Signed)
[facility] Nurse wound consult note Reason for Consult:Right heel wound Following discussion with Dr. Tressia Miners, [facility] will not see.  Simultaneous consultation to Podiatry was made.  [facility] nursing team will not follow, but will remain available to this patient, the nursing and medical teams.  Please re-consult if needed. Thanks, Maudie Flakes, MSN, RN, [place], Arther Abbott  Pager# [phone]

## 2018-12-20 NOTE — Progress Notes (Signed)
   [facility] Day: 0 days [name] is a 84 y.o. male presenting with Shortness of Breath .   Advance care planning discussed with patient and his wife at bedside. -Patient has multiple medical problems including CKD, CAD, peripheral arterial disease status post left BKA, CHF, uncontrolled diabetes mellitus and chronic right lower extremity wound. -Is being admitted for acute hypoxic respiratory failure requiring high flow oxygen and worsening right heel ulcer requiring ID and podiatry consultations. Patient does not have a living will made.  He and his wife both agree that he needs an attempt at resuscitation.  All questions in regards to overall condition and expected prognosis answered. The decision was made to continue current code status  CODE STATUS: Full code Time spent: 18 minutes

## 2018-12-20 NOTE — ED Notes (Signed)
ED TO INPATIENT HANDOFF REPORT  ED Nurse Name and Phone #: [place] Name/Age/Gender [name] 84 y.o. male Room/Bed: ED06A/ED06A  Code Status   Code Status: Prior  Home/SNF/Other Home Patient oriented to: self, place, time and situation Is this baseline? Yes   Triage Complete: Triage complete  Chief Complaint [facility] - Shortness of breath  Triage Note Pt arrived via EMS from home where pt was in respiratory distress. EMS reported pt lying on floor, SOB, sat 88% RA. Pt denied fall. Pt has hx/o CHF. Pt arrived to ED with non-re breather in place, 98%, labored breathing, A&O x4. Pt had stent placements in right foot x2 days ago. Pt received 125mg  Solumedrol, 1 duoneb in route.    Allergies Allergies  Allergen Reactions  . Contrast Media [Iodinated Diagnostic Agents] Shortness Of Breath    SOB  . Iohexol Shortness Of Breath     Desc: Respiratory Distress, Laryngedema, diaphoresis, Onset Date: [id number]   . Metrizamide Shortness Of Breath    SOB SOB SOB   . Latex Rash    Level of Care/Admitting Diagnosis ED Disposition    ED Disposition Condition [facility]: [place] [100120]  Level of Care: Stepdown [14]  Covid Evaluation: Confirmed COVID Negative  Diagnosis: Acute respiratory failure ([place]) [518.81.ICD-9-CM]  Admitting Physician: [name] [[id number]]  Attending Physician: [name] [[id number]]  Estimated length of stay: past midnight tomorrow  Certification:: I certify this patient will need inpatient services for at least 2 midnights  PT Class (Do Not Modify): Inpatient [101]  PT Acc Code (Do Not Modify): Private [1]       B Medical/Surgery History Past Medical History:  Diagnosis Date  . Arthritis   . Atrial fibrillation ([place])   . Carcinoma of prostate ([place])   . CHF (congestive heart failure) ([place])   . Chronic kidney disease   . Diabetes mellitus without complication ([place])    . ED (erectile dysfunction)   . Frequent urination   . Hyperlipidemia   . Hypertension   . Moderate mitral insufficiency   . Peripheral vascular disease ([place])   . Pneumonia 04/2016  . Prostate cancer ([place])   . Sleep apnea    OSA--USE C-PAP  . Ulcer of left foot due to type 2 diabetes mellitus ([place])   . Urinary stress incontinence, male    Past Surgical History:  Procedure Laterality Date  . AMPUTATION Left 01/12/2017   Procedure: AMPUTATION BELOW KNEE;  Surgeon: Algernon Huxley, MD;  Location: [facility] ORS;  Service: General;  Laterality: Left;  . APLIGRAFT PLACEMENT Left 10/15/2016   Procedure: APLIGRAFT PLACEMENT;  Surgeon: Albertine Patricia, DPM;  Location: [facility] OR[facility];  Service: Podiatry;  Laterality: Left;  necrotic ulcer  . CHOLECYSTECTOMY    . EYE SURGERY Bilateral    Cataract Extraction with IOL  . HIP ARTHROPLASTY Right 10/13/2018   Procedure: ARTHROPLASTY BIPOLAR HIP (HEMIARTHROPLASTY);  Surgeon: Earnestine Leys, MD;  Location: [facility] OR[facility];  Service: Orthopedics;  Laterality: Right;  . IRRIGATION AND DEBRIDEMENT FOOT Left 10/15/2016   Procedure: IRRIGATION AND DEBRIDEMENT FOOT-EXCISIONAL DEBRIDEMENT OF SKIN 3RD, 4TH AND 5TH TOES WITH APPLICATION OF APLIGRAFT;  Surgeon: Albertine Patricia, DPM;  Location: [facility];  Service: Podiatry;  Laterality: Left;  necrotic,gangrene  . IRRIGATION AND DEBRIDEMENT FOOT Left 12/09/2016   Procedure: IRRIGATION AND DEBRIDEMENT FOOT-MUSCLE/FASCIA, RESECTION OF FOURTH AND FIFTH METATARSAL NECROTIC BONE;  Surgeon: Albertine Patricia, DPM;  Location: [facility];  Service: Podiatry;  Laterality: Left;  . LEFT HEART CATH AND CORONARY ANGIOGRAPHY N/A 06/05/2018   Procedure: LEFT HEART CATH AND CORONARY ANGIOGRAPHY;  Surgeon: Yolonda Kida, MD;  Location: [facility];  Service: Cardiovascular;  Laterality: N/A;  . LOWER EXTREMITY ANGIOGRAPHY Left 08/16/2016   Procedure: Lower Extremity Angiography;  Surgeon: Algernon Huxley, MD;  Location: [facility];   Service: Cardiovascular;  Laterality: Left;  . LOWER EXTREMITY ANGIOGRAPHY Left 09/01/2016   Procedure: Lower Extremity Angiography;  Surgeon: Algernon Huxley, MD;  Location: [facility];  Service: Cardiovascular;  Laterality: Left;  . LOWER EXTREMITY ANGIOGRAPHY Left 10/14/2016   Procedure: Lower Extremity Angiography;  Surgeon: Algernon Huxley, MD;  Location: [facility];  Service: Cardiovascular;  Laterality: Left;  . LOWER EXTREMITY ANGIOGRAPHY Left 12/20/2016   Procedure: Lower Extremity Angiography;  Surgeon: Algernon Huxley, MD;  Location: [facility];  Service: Cardiovascular;  Laterality: Left;  . LOWER EXTREMITY ANGIOGRAPHY Right 12/18/2018   Procedure: LOWER EXTREMITY ANGIOGRAPHY;  Surgeon: Algernon Huxley, MD;  Location: [facility];  Service: Cardiovascular;  Laterality: Right;  . LOWER EXTREMITY INTERVENTION  09/01/2016   Procedure: Lower Extremity Intervention;  Surgeon: Algernon Huxley, MD;  Location: [facility];  Service: Cardiovascular;;  . PROSTATE SURGERY     removal  . TONSILLECTOMY     as a child     A IV Location/Drains/Wounds Patient Lines/Drains/Airways Status   Active Line/Drains/Airways    Name:   Placement date:   Placement time:   Site:   Days:   Peripheral IV 12/20/18 Left Antecubital   12/20/18    0559    Antecubital   less than 1   Peripheral IV 12/20/18 Right Forearm   12/20/18    0610    Forearm   less than 1   Sheath 12/18/18 Left Arterial;Femoral   12/18/18    1357    Arterial;Femoral   2   External Urinary Catheter   10/15/18    0537    no documentation   66   Incision (Closed) 10/13/18 Hip Right   10/13/18    1652     68   Pressure Injury 10/16/18   10/16/18    no documentation     65   Pressure Injury 10/16/18 Deep Tissue Injury - Purple or maroon localized area of discolored intact skin or blood-filled blister due to damage of underlying soft tissue from pressure and/or shear. Darkened skin resembles bruising   10/16/18     1118     65          Intake/Output Last 24 hours No intake or output data in the 24 hours ending 12/20/18 1004  Labs/Imaging Results for orders placed or performed during the hospital encounter of 12/20/18 (from the past 48 hour(s))  CBC with Differential/Platelet     Status: Abnormal   Collection Time: 12/20/18  6:02 AM  Result Value Ref Range   WBC 28.3 (H) 4.0 - 10.5 K/uL   RBC 4.00 (L) 4.22 - 5.81 MIL/uL   Hemoglobin 11.6 (L) 13.0 - 17.0 g/dL   HCT 35.4 (L) 39.0 - 52.0 %   MCV 88.5 80.0 - 100.0 fL   MCH 29.0 26.0 - 34.0 pg   MCHC 32.8 30.0 - 36.0 g/dL   RDW 14.2 11.5 - 15.5 %   Platelets 367 150 - 400 K/uL   nRBC 0.0 0.0 - 0.2 %   Neutrophils  Relative % 91 %   Neutro Abs 25.8 (H) 1.7 - 7.7 K/uL   Lymphocytes Relative 4 %   Lymphs Abs 1.1 0.7 - 4.0 K/uL   Monocytes Relative 4 %   Monocytes Absolute 1.1 (H) 0.1 - 1.0 K/uL   Eosinophils Relative 0 %   Eosinophils Absolute 0.0 0.0 - 0.5 K/uL   Basophils Relative 0 %   Basophils Absolute 0.1 0.0 - 0.1 K/uL   WBC Morphology HYPERSEGMENTED NEUT    RBC Morphology MORPHOLOGY UNREMARKABLE    Smear Review Normal platelet morphology    Immature Granulocytes 1 %   Abs Immature Granulocytes 0.29 (H) 0.00 - 0.07 K/uL    Comment: Performed at [facility], [address] [place], [place] [address]  Comprehensive metabolic panel     Status: Abnormal   Collection Time: 12/20/18  6:02 AM  Result Value Ref Range   Sodium 134 (L) 135 - 145 mmol/L   Potassium 4.0 3.5 - 5.1 mmol/L   Chloride 99 98 - 111 mmol/L   CO2 23 22 - 32 mmol/L   Glucose, Bld 521 (HH) 70 - 99 mg/dL    Comment: CRITICAL RESULT CALLED TO, READ BACK BY AND VERIFIED WITH VANESSA ASHLEY@0642  ON 12/20/18 BY HKP    BUN 28 (H) 8 - 23 mg/dL   Creatinine, Ser 1.33 (H) 0.61 - 1.24 mg/dL   Calcium 8.7 (L) 8.9 - 10.3 mg/dL   Total Protein 7.8 6.5 - 8.1 g/dL   Albumin 3.1 (L) 3.5 - 5.0 g/dL   AST 23 15 - 41 U/L   ALT 22 0 - 44 U/L   Alkaline Phosphatase 168  (H) 38 - 126 U/L   Total Bilirubin 1.2 0.3 - 1.2 mg/dL   GFR calc non Af Amer 50 (L) >60 mL/min   GFR calc Af Amer 58 (L) >60 mL/min   Anion gap 12 5 - 15    Comment: Performed at [facility], [address] [place], [place] [address]  Troponin I (High Sensitivity)     Status: Abnormal   Collection Time: 12/20/18  6:02 AM  Result Value Ref Range   Troponin I (High Sensitivity) 234 (HH) <18 ng/L    Comment: CRITICAL RESULT CALLED TO, READ BACK BY AND VERIFIED WITH VANESSA ASHLEY@0642  ON 12/20/18 BY HKP (NOTE) Elevated high sensitivity troponin I (hsTnI) values and significant  changes across serial measurements may suggest ACS but many other  chronic and acute conditions are known to elevate hsTnI results.  Refer to the "Links" section for chest pain algorithms and additional  guidance. Performed at [facility], [address] [place], [place] [address]   SARS Coronavirus 2 (CEPHEID- Performed in [facility] hospital lab), Hosp Order     Status: None   Collection Time: 12/20/18  6:02 AM   Specimen: Nasopharyngeal Swab  Result Value Ref Range   SARS Coronavirus 2 NEGATIVE NEGATIVE    Comment: (NOTE) If result is NEGATIVE SARS-CoV-2 target nucleic acids are NOT DETECTED. The SARS-CoV-2 RNA is generally detectable in upper and lower  respiratory specimens during the acute phase of infection. The lowest  concentration of SARS-CoV-2 viral copies this assay can detect is 250  copies / mL. A negative result does not preclude SARS-CoV-2 infection  and should not be used as the sole basis for treatment or other  patient management decisions.  A negative result may occur with  improper specimen collection / handling, submission of specimen other  than nasopharyngeal  swab, presence of viral mutation(s) within the  areas targeted by this assay, and inadequate number of viral copies  (<250 copies / mL). A negative result must be combined with clinical  observations,  patient history, and epidemiological information. If result is POSITIVE SARS-CoV-2 target nucleic acids are DETECTED. The SARS-CoV-2 RNA is generally detectable in upper and lower  respiratory specimens dur ing the acute phase of infection.  Positive  results are indicative of active infection with SARS-CoV-2.  Clinical  correlation with patient history and other diagnostic information is  necessary to determine patient infection status.  Positive results do  not rule out bacterial infection or co-infection with other viruses. If result is PRESUMPTIVE POSTIVE SARS-CoV-2 nucleic acids MAY BE PRESENT.   A presumptive positive result was obtained on the submitted specimen  and confirmed on repeat testing.  While 2019 novel coronavirus  (SARS-CoV-2) nucleic acids may be present in the submitted sample  additional confirmatory testing may be necessary for epidemiological  and / or clinical management purposes  to differentiate between  SARS-CoV-2 and other Sarbecovirus currently known to infect humans.  If clinically indicated additional testing with an alternate test  methodology [phone]) is advised. The SARS-CoV-2 RNA is generally  detectable in upper and lower respiratory sp ecimens during the acute  phase of infection. The expected result is Negative. Fact Sheet for Patients:  StrictlyIdeas.no Fact Sheet for Healthcare Providers: BankingDealers.co.za This test is not yet approved or cleared by the [place] FDA and has been authorized for detection and/or diagnosis of SARS-CoV-2 by FDA under an Emergency Use Authorization (EUA).  This EUA will remain in effect (meaning this test can be used) for the duration of the COVID-19 declaration under Section 564(b)(1) of the Act, 21 U.S.C. section 360bbb-3(b)(1), unless the authorization is terminated or revoked sooner. Performed at [facility], [address]  [place], [place] [address]   Brain natriuretic peptide     Status: Abnormal   Collection Time: 12/20/18  6:02 AM  Result Value Ref Range   B Natriuretic Peptide 1,436.0 (H) 0.0 - 100.0 pg/mL    Comment: Performed at [facility], [address] [place], [place] [address]  Lactic acid, plasma     Status: Abnormal   Collection Time: 12/20/18  6:02 AM  Result Value Ref Range   Lactic Acid, Venous 2.8 (HH) 0.5 - 1.9 mmol/L    Comment: CRITICAL RESULT CALLED TO, READ BACK BY AND VERIFIED WITH ARIEL WALLACE @0855  ON 12/20/18 BY HKP Performed at [facility], [address] [place], [place] [address]   Procalcitonin - Baseline     Status: None   Collection Time: 12/20/18  6:02 AM  Result Value Ref Range   Procalcitonin 0.16 ng/mL    Comment:        Interpretation: PCT (Procalcitonin) <= 0.5 ng/mL: Systemic infection (sepsis) is not likely. Local bacterial infection is possible. (NOTE)       Sepsis PCT Algorithm           Lower Respiratory Tract                                      Infection PCT Algorithm    ----------------------------     ----------------------------         PCT < 0.25 ng/mL                PCT <  0.10 ng/mL         Strongly encourage             Strongly discourage   discontinuation of antibiotics    initiation of antibiotics    ----------------------------     -----------------------------       PCT 0.25 - 0.50 ng/mL            PCT 0.10 - 0.25 ng/mL               OR       >80% decrease in PCT            Discourage initiation of                                            antibiotics      Encourage discontinuation           of antibiotics    ----------------------------     -----------------------------         PCT >= 0.50 ng/mL              PCT 0.26 - 0.50 ng/mL               AND        <80% decrease in PCT             Encourage initiation of                                             antibiotics       Encourage continuation           of  antibiotics    ----------------------------     -----------------------------        PCT >= 0.50 ng/mL                  PCT > 0.50 ng/mL               AND         increase in PCT                  Strongly encourage                                      initiation of antibiotics    Strongly encourage escalation           of antibiotics                                     -----------------------------                                           PCT <= 0.25 ng/mL                                                 OR                                        >  80% decrease in PCT                                     Discontinue / Do not initiate                                             antibiotics Performed at [facility], [address] [place], [place] [address]   Blood gas, venous     Status: Abnormal   Collection Time: 12/20/18  6:08 AM  Result Value Ref Range   FIO2 0.45    Delivery systems NO CHARGE    pH, Ven 7.38 7.250 - 7.430   pCO2, Ven 44 44.0 - 60.0 mmHg   pO2, Ven <31.0 (LL) 32.0 - 45.0 mmHg   Bicarbonate 26.0 20.0 - 28.0 mmol/L   Acid-Base Excess 0.5 0.0 - 2.0 mmol/L   O2 Saturation 30.7 %   Patient temperature 37.0    Collection site VENOUS    Sample type VENOUS     Comment: Performed at [facility], [address] [place], [place] [address]   Dg Chest Portable 1 View  Result Date: 12/20/2018 CLINICAL DATA:  Shortness of breath. EXAM: PORTABLE CHEST 1 VIEW COMPARISON:  08/13/2018. FINDINGS: Cardiomegaly. Diffuse bilateral pulmonary infiltrates/edema. Small bilateral pleural effusions. No pneumothorax. No acute bony abnormality identified. IMPRESSION: Cardiomegaly with diffuse bilateral pulmonary infiltrates and edema. Small bilateral pleural effusions. CHF could present this fashion. Pneumonitis cannot be excluded. Electronically Signed   By: Marcello Moores  Register   On: 12/20/2018 07:01    Pending Labs Unresulted Labs (From admission, onward)    Start      Ordered   12/20/18 0712  Blood culture (routine x 2)  BLOOD CULTURE X 2,   STAT     12/20/18 0711   12/20/18 0712  Lactic acid, plasma  Now then every 2 hours,   STAT     12/20/18 0711   Signed and Held  CULTURE, BLOOD (ROUTINE X 2) w Reflex to ID Panel  BLOOD CULTURE X 2,   R    Question:  Patient immune status  Answer:  Normal   Signed and Held   Signed and Held  Aerobic/Anaerobic Culture (surgical/deep wound)  Once,   R    Question:  Patient immune status  Answer:  Normal   Signed and Held   Signed and Held  Basic metabolic panel  Tomorrow morning,   R     Signed and Held   Signed and Held  CBC  Tomorrow morning,   R     Signed and Held   Signed and Held  Hemoglobin A1c  Add-on,   R     Signed and Held   Signed and Held  Hemoglobin A1c  Once,   R    Comments: To assess prior glycemic control    Signed and Held          Vitals/Pain Today's Vitals   12/20/18 0601 12/20/18 0630 12/20/18 0830  BP: (Abnormal) 161/70 (Abnormal) 183/84 139/70  Pulse: (Abnormal) 113 (Abnormal) 112 99  Resp: (Abnormal) 22 (Abnormal) 21 18  Temp: 98.6 F (37 C)    TempSrc: Oral    SpO2: (Abnormal) 85% 98% 93%    Isolation Precautions No active isolations  Medications Medications  furosemide (LASIX) injection 40 mg (40 mg Intravenous Given 12/20/18 0651)  insulin aspart (novoLOG) injection 10 Units (10 Units Intravenous Given 12/20/18 0650)    Mobility walks High fall risk   Focused Assessments Pulmonary Assessment Handoff:  Lung sounds:   O2 Device: High Flow Nasal Cannula O2 Flow Rate (L/min): 50 L/min      R Recommendations: See Admitting Provider Note  Report given to:   Additional Notes: 0

## 2018-12-20 NOTE — Consult Note (Signed)
CRITICAL CARE PROGRESS NOTE    Name: Kaliq Lege MRN: [id number] DOB: Jul 09, 1934     LOS: 0   HISTORY OF PRESENT ILLNESS    This is a pleasant 84 yo male with hx of HFpEF, essential HTN, PAD, AF, CVA, DM2, AF on xarelto hypothyroidism, prostate CA s/p radical prostatectomy, dyslipidemia, hx of RLE amputation due to critical limb ischemia. Patient had been seen by vascular surgery previously with attempts to salvage LLE with reperfusion as per patient but he reports worsening heel eschar and severe pain to minimal manipulation. He was admitted due to altered mentation with confusion.  Patient states he was given 6 pills of benadryl but he is a poor historian and am seems unreliable in details of history. He also reported chest pain and states wife gave sublingual nitro.  He was evaluated by hospitalist who noted incresed O2 requirement on 5L/min [place] and severely elevated blood glucose. Patient is a smoker in past stopped 25 years ago.  He also had signs of interstitial edema on CXR and elevated BNP >1300.  His CBC was significant to leukocytosis.     PAST MEDICAL HISTORY   Past Medical History:  Diagnosis Date  . Arthritis   . Atrial fibrillation ([place])   . Carcinoma of prostate ([place])   . CHF (congestive heart failure) ([place])   . Chronic kidney disease   . Diabetes mellitus without complication ([place])   . ED (erectile dysfunction)   . Frequent urination   . Hyperlipidemia   . Hypertension   . Moderate mitral insufficiency   . Peripheral vascular disease ([place])   . Pneumonia 04/2016  . Prostate cancer ([place])   . Sleep apnea    OSA--USE C-PAP  . Ulcer of left foot due to type 2 diabetes mellitus ([place])   . Urinary stress incontinence, male      SURGICAL HISTORY   Past Surgical History:  Procedure  Laterality Date  . AMPUTATION Left 01/12/2017   Procedure: AMPUTATION BELOW KNEE;  Surgeon: Algernon Huxley, MD;  Location: [facility];  Service: General;  Laterality: Left;  . APLIGRAFT PLACEMENT Left 10/15/2016   Procedure: APLIGRAFT PLACEMENT;  Surgeon: Albertine Patricia, DPM;  Location: [facility];  Service: Podiatry;  Laterality: Left;  necrotic ulcer  . CHOLECYSTECTOMY    . EYE SURGERY Bilateral    Cataract Extraction with IOL  . HIP ARTHROPLASTY Right 10/13/2018   Procedure: ARTHROPLASTY BIPOLAR HIP (HEMIARTHROPLASTY);  Surgeon: Earnestine Leys, MD;  Location: [facility];  Service: Orthopedics;  Laterality: Right;  . IRRIGATION AND DEBRIDEMENT FOOT Left 10/15/2016   Procedure: IRRIGATION AND DEBRIDEMENT FOOT-EXCISIONAL DEBRIDEMENT OF SKIN 3RD, 4TH AND 5TH TOES WITH APPLICATION OF APLIGRAFT;  Surgeon: Albertine Patricia, DPM;  Location: [facility];  Service: Podiatry;  Laterality: Left;  necrotic,gangrene  . IRRIGATION AND DEBRIDEMENT FOOT Left 12/09/2016   Procedure: IRRIGATION AND DEBRIDEMENT FOOT-MUSCLE/FASCIA, RESECTION OF FOURTH AND FIFTH METATARSAL NECROTIC BONE;  Surgeon: Albertine Patricia, DPM;  Location: [facility];  Service: Podiatry;  Laterality: Left;  . LEFT HEART CATH AND CORONARY ANGIOGRAPHY N/A 06/05/2018   Procedure: LEFT HEART CATH AND CORONARY ANGIOGRAPHY;  Surgeon: Yolonda Kida, MD;  Location: [place] [facility];  Service: Cardiovascular;  Laterality: N/A;  . LOWER EXTREMITY ANGIOGRAPHY Left 08/16/2016   Procedure: Lower Extremity Angiography;  Surgeon: Algernon Huxley, MD;  Location: [place] [facility];  Service: Cardiovascular;  Laterality: Left;  . LOWER EXTREMITY ANGIOGRAPHY Left 09/01/2016   Procedure: Lower Extremity Angiography;  Surgeon: Corene Cornea  Bunnie Domino, MD;  Location: [place] [facility];  Service: Cardiovascular;  Laterality: Left;  . LOWER EXTREMITY ANGIOGRAPHY Left 10/14/2016   Procedure: Lower Extremity Angiography;  Surgeon: Algernon Huxley, MD;  Location: [place] [facility];   Service: Cardiovascular;  Laterality: Left;  . LOWER EXTREMITY ANGIOGRAPHY Left 12/20/2016   Procedure: Lower Extremity Angiography;  Surgeon: Algernon Huxley, MD;  Location: [place] [facility];  Service: Cardiovascular;  Laterality: Left;  . LOWER EXTREMITY ANGIOGRAPHY Right 12/18/2018   Procedure: LOWER EXTREMITY ANGIOGRAPHY;  Surgeon: Algernon Huxley, MD;  Location: [place] [facility];  Service: Cardiovascular;  Laterality: Right;  . LOWER EXTREMITY INTERVENTION  09/01/2016   Procedure: Lower Extremity Intervention;  Surgeon: Algernon Huxley, MD;  Location: [place] [facility];  Service: Cardiovascular;;  . PROSTATE SURGERY     removal  . TONSILLECTOMY     as a child     FAMILY HISTORY   Family History  Problem Relation Age of Onset  . Hypertension Mother   . Diabetes Mother   . Prostate cancer Neg Hx   . Bladder Cancer Neg Hx   . Kidney disease Neg Hx      SOCIAL HISTORY   Social History   Tobacco Use  . Smoking status: Former Smoker    Packs/day: 0.25    Types: Cigarettes    Quit date: 10/14/1994    Years since quitting: 24.2  . Smokeless tobacco: Never Used  Substance Use Topics  . Alcohol use: No    Alcohol/week: 0.0 standard drinks  . Drug use: No     MEDICATIONS   Current Medication:  Current Facility-Administered Medications:  .  0.9 %  sodium chloride infusion, , Intravenous, Continuous, Dejuan Elman, MD .  acetaminophen (TYLENOL) tablet 650 mg, 650 mg, Oral, Q6H PRN **OR** acetaminophen (TYLENOL) suppository 650 mg, 650 mg, Rectal, Q6H PRN, [name], MD .  amiodarone (PACERONE) tablet 200 mg, 200 mg, Oral, Daily, Tressia Miners, Radhika, MD, 200 mg at 12/20/18 1128 .  aspirin EC tablet 81 mg, 81 mg, Oral, Daily, Kalisetti, Radhika, MD, 81 mg at 12/20/18 1126 .  atorvastatin (LIPITOR) tablet 80 mg, 80 mg, Oral, QPM, Kalisetti, Radhika, MD .  dextrose 5 %-0.45 % sodium chloride infusion, , Intravenous, Continuous, Ottie Glazier, MD, Stopped at 12/20/18  1134 .  dextrose 50 % solution 25 mL, 25 mL, Intravenous, PRN, Lanney Gins, Diar Berkel, MD .  diltiazem (CARDIZEM CD) 24 hr capsule 120 mg, 120 mg, Oral, Daily, Tressia Miners, Radhika, MD, 120 mg at 12/20/18 1128 .  [START ON 12/21/2018] ferrous sulfate tablet 325 mg, 325 mg, Oral, Q breakfast, Kalisetti, Radhika, MD .  furosemide (LASIX) injection 40 mg, 40 mg, Intravenous, Q12H, [name], MD, 40 mg at 12/20/18 1128 .  HYDROcodone-acetaminophen (NORCO/VICODIN) 5-325 MG per tablet 1-2 tablet, 1-2 tablet, Oral, Q6H PRN, [name], MD .  insulin regular bolus via infusion 0-10 Units, 0-10 Units, Intravenous, TID WC, Missey Hasley, MD .  insulin regular, human (MYXREDLIN) 100 units/ 100 mL infusion, , Intravenous, Continuous, Oriel Ojo, MD .  methocarbamol (ROBAXIN) tablet 500 mg, 500 mg, Oral, Q6H PRN, [name], MD .  metoprolol tartrate (LOPRESSOR) tablet 25 mg, 25 mg, Oral, TID, [name], MD, 25 mg at 12/20/18 1142 .  morphine 2 MG/ML injection 1-2 mg, 1-2 mg, Intravenous, Q4H PRN, Lanney Gins, Liat Mayol, MD .  nitroGLYCERIN (NITROSTAT) SL tablet 0.4 mg, 0.4 mg, Sublingual, Q5 min PRN, [name], MD .  ondansetron (ZOFRAN) tablet 4 mg,  4 mg, Oral, Q6H PRN **OR** ondansetron (ZOFRAN) injection 4 mg, 4 mg, Intravenous, Q6H PRN, [name], MD    ALLERGIES   Contrast media [iodinated diagnostic agents], Iohexol, Metrizamide, and Latex    REVIEW OF SYSTEMS   10 Point ROS done and is negative except for severe LLE pain due to gangrenous chronic ulceration of heel.   PHYSICAL EXAMINATION   Vital Signs: Temp:  [98.3 F (36.8 C)-98.6 F (37 C)] 98.3 F (36.8 C) (07/29 1045) Pulse Rate:  [99-113] 99 (07/29 1045) Resp:  [18-25] 25 (07/29 1045) BP: (106-183)/(70-84) 106/71 (07/29 1045) SpO2:  [85 %-100 %] 100 % (07/29 1045) FiO2 (%):  [45 %] 45 % (07/29 0629) Weight:  [79.9 kg] 79.9 kg (07/29 1045)  GENERAL:mild distress due to LLE pain HEAD:  Normocephalic, atraumatic.  EYES: Pupils equal, round, reactive to light.  No scleral icterus.  MOUTH: Moist mucosal membrane. NECK: Supple. No thyromegaly. No nodules. No JVD.  PULMONARY: decreased breath sounds bilaterally  CARDIOVASCULAR: S1 and S2. Regular rate and rhythm. No murmurs, rubs, or gallops.  GASTROINTESTINAL: Soft, nontender, non-distended. No masses. Positive bowel sounds. No hepatosplenomegaly.  MUSCULOSKELETAL: No swelling, clubbing, or edema.  NEUROLOGIC: Mild distress due to acute illness SKIN:intact,warm,dry   PERTINENT DATA     Lines / Drains: PIV  Cultures / Sepsis markers: Wound and blood culture pending   Antibiotics: PPX keflex    Protocols / Consultants: Vascular surgery and ID in process  Tests / Events: TTE   Overnight: No significant overnight events.     Infusions: . sodium chloride    . dextrose 5 % and 0.45% NaCl Stopped (12/20/18 1134)  . insulin     Scheduled Medications: . amiodarone  200 mg Oral Daily  . aspirin EC  81 mg Oral Daily  . atorvastatin  80 mg Oral QPM  . diltiazem  120 mg Oral Daily  . [START ON 12/21/2018] ferrous sulfate  325 mg Oral Q breakfast  . furosemide  40 mg Intravenous Q12H  . insulin regular  0-10 Units Intravenous TID WC  . metoprolol tartrate  25 mg Oral TID   PRN Medications: acetaminophen **OR** acetaminophen, dextrose, HYDROcodone-acetaminophen, methocarbamol, morphine injection, nitroGLYCERIN, ondansetron **OR** ondansetron (ZOFRAN) IV Hemodynamic parameters:   Intake/Output: No intake/output data recorded.  Ventilator  Settings: FiO2 (%):  [45 %] 45 %    LAB RESULTS:  Basic Metabolic Panel: Recent Labs  Lab 12/18/18 1022 12/20/18 0602  NA 135 134*  K 4.1 4.0  CL 103 99  CO2 21* 23  GLUCOSE 323* 521*  BUN 18 28*  CREATININE 1.28* 1.33*  CALCIUM 8.5* 8.7*   Liver Function Tests: Recent Labs  Lab 12/20/18 0602  AST 23  ALT 22  ALKPHOS 168*  BILITOT 1.2  PROT 7.8   ALBUMIN 3.1*   No results for input(s): LIPASE, AMYLASE in the last 168 hours. No results for input(s): AMMONIA in the last 168 hours. CBC: Recent Labs  Lab 12/20/18 0602  WBC 28.3*  NEUTROABS 25.8*  HGB 11.6*  HCT 35.4*  MCV 88.5  PLT 367   Cardiac Enzymes: No results for input(s): CKTOTAL, CKMB, CKMBINDEX, TROPONINI in the last 168 hours. BNP: Invalid input(s): POCBNP CBG: Recent Labs  Lab 12/18/18 1026 12/18/18 1628 12/20/18 1045  GLUCAP 299* 306* 436*     IMAGING RESULTS:  Imaging: Dg Chest Portable 1 View  Result Date: 12/20/2018 CLINICAL DATA:  Shortness of breath. EXAM: PORTABLE CHEST 1 VIEW COMPARISON:  08/13/2018. FINDINGS: Cardiomegaly.  Diffuse bilateral pulmonary infiltrates/edema. Small bilateral pleural effusions. No pneumothorax. No acute bony abnormality identified. IMPRESSION: Cardiomegaly with diffuse bilateral pulmonary infiltrates and edema. Small bilateral pleural effusions. CHF could present this fashion. Pneumonitis cannot be excluded. Electronically Signed   By: Marcello Moores  Register   On: 12/20/2018 07:01     LEFT HEART CATH - 06/05/2018  Dist LM to Ost LAD lesion is 100% stenosed.  Ost RCA to Prox RCA lesion is 75% stenosed.  Prox Cx lesion is 50% stenosed.   Conclusion Inpatient diagnostic cardiac catheter right radial approach Vessel coronary disease Apical ostial left anterior descending was totally occluded faint collaterals from right to left Circumflex was large with moderate disease in the proximal segment RCA was large with ostial calcification atheroma ejection fraction around 75% TIMI-3 flow Left ventriculogram was performed manually with the right coronary catheter which showed anterior apical akinesis ejection fraction was estimated around 35% Intervention was deferred in favor of aggressive medical therapy     TRANSTHORACIC ECHOCARDIOGRAM -Left ventricle: Systolic function was mildly reduced. The   estimated ejection  fraction was in the range of 45% to 50%.   Hypokinesis of the anteroseptal myocardium. Hypokinesis of the   apical myocardium. - Left atrium: The atrium was mildly dilated.   ASSESSMENT AND PLAN    -Multidisciplinary rounds held today  Acute Hypoxic Respiratory Failure  -due to interstitial edema likely due to cardiac etiology - continue supportive care with O therapy while treating underlying cause -Wean Fio2     Acute decompensated sytolic CHF with EF  24-23% - patient had CP at home  - will repeat TTE to evaluate interval changes - hx of renal impairment based on medical record - IV lasix 40 q12h  -oxygen as needed -Lasix as tolerated -follow up cardiac enzymes as indicated ICU monitoring    Renal Failure-most likely cardiorenal in etiology -follow chem 7 -follow UO -continue Foley Catheter-assess need daily    Hx hemmoragic CVA   - patient received tPA post ischemic CVA in jan 2020   Severe hyperglycemia  -likely due to high dose steroids IV recently on background of DM - patient will need short term insulin drip, discussed with pharmacy   GI/Nutrition GI PROPHYLAXIS as indicated DIET-->TF's as tolerated Constipation protocol as indicated  ENDO - ICU hypoglycemic\Hyperglycemia protocol -check FSBS per protocol   ELECTROLYTES -follow labs as needed -replace as needed -pharmacy consultation   DVT/GI PRX ordered -SCDs  TRANSFUSIONS AS NEEDED MONITOR FSBS ASSESS the need for LABS as needed   Critical care provider statement:    Critical care time (minutes):  109   Critical care time was exclusive of:  Separately billable procedures and treating other patients   Critical care was necessary to treat or prevent imminent or life-threatening deterioration of the following conditions:  acute decompensated systolic CHF, RLE necrotic heel wound, severe hyperglycemia   Critical care was time spent personally by me on the following activities:   Development of treatment plan with patient or surrogate, discussions with consultants, evaluation of patient's response to treatment, examination of patient, obtaining history from patient or surrogate, ordering and performing treatments and interventions, ordering and review of laboratory studies and re-evaluation of patient's condition.  I assumed direction of critical care for this patient from another provider in my specialty: no    This document was prepared using Dragon voice recognition software and may include unintentional dictation errors.    Ottie Glazier, M.D.  Division of Pulmonary & Critical Care Medicine  [place]

## 2018-12-20 NOTE — ED Provider Notes (Signed)
[facility] Emergency Department Provider Note  ____________________________________________  Time seen: Approximately 6:23 AM  I have reviewed the triage vital signs and the nursing notes.   HISTORY  Chief Complaint Shortness of Breath   HPI [name] is a 84 y.o. male with history of CHF with a EF of 45%, chronic kidney disease, diabetes, peripheral vascular disease state cancer, sleep apnea on CPAP who presents for evaluation of shortness of breath.  Patient reports chest pain that woke him up from his sleep.  He describes the pain as feeling of indigestion.  Associated with shortness of breath.  He has his wife for nitro.  The pain did not improve after nitro which prompted 911 to be called.  He still complaining of pressure in his chest and shortness of breath.  No cough or fever.  Patient is status post angioplasty and stent placement on his right leg 2 days ago.  He denies pain in his leg.  He denies any history of PE or DVT.  He is on Xarelto.  Past Medical History:  Diagnosis Date  . Arthritis   . Atrial fibrillation ([place])   . Carcinoma of prostate ([place])   . CHF (congestive heart failure) ([place])   . Chronic kidney disease   . Diabetes mellitus without complication ([place])   . ED (erectile dysfunction)   . Frequent urination   . Hyperlipidemia   . Hypertension   . Moderate mitral insufficiency   . Peripheral vascular disease ([place])   . Pneumonia 04/2016  . Prostate cancer ([place])   . Sleep apnea    OSA--USE C-PAP  . Ulcer of left foot due to type 2 diabetes mellitus ([place])   . Urinary stress incontinence, male     Patient Active Problem List   Diagnosis Date Noted  . Femur fracture ([place]) 10/13/2018  . Chest pain 06/03/2018  . NSTEMI (non-ST elevated myocardial infarction) ([place]) 01/16/2018  . Acute CHF (congestive heart failure) ([place]) 11/23/2017  . Atherosclerosis of native arteries of the extremities with ulceration ([place]) 11/18/2017   . Atrial fibrillation with rapid ventricular response ([place]) 10/15/2017  . BKA stump complication ([place]) 21/30/8657  . 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 S/P BKA (below knee amputation) unilateral, left ([place]) 02/01/2017  . Chronic diastolic heart failure ([place]) 01/21/2017  . Pressure injury of skin 01/18/2017  . Atherosclerosis of artery of extremity with gangrene ([place]) 01/05/2017  . Diabetic foot ulcer ([place]) 12/29/2016  . Ataxia 10/21/2016  . History of femoral angiogram 09/06/2016  . Left leg pain 09/06/2016  . Leukocytosis 09/06/2016  . Generalized weakness 09/06/2016  . Atrial fibrillation, chronic 08/30/2016  . Pneumonia 05/19/2016  . Hyperlipidemia 04/20/2016  . Back pain 04/19/2016  . Type 2 diabetes mellitus treated with insulin ([place]) 04/19/2016  . Essential hypertension 04/19/2016  . PAD (peripheral artery disease) ([place]) 04/19/2016  . Erectile dysfunction following radical prostatectomy 06/25/2015  . History of prostate cancer 12/23/2014  . Incontinence 12/23/2014    Past Surgical History:  Procedure Laterality Date  . AMPUTATION Left 01/12/2017   Procedure: AMPUTATION BELOW KNEE;  Surgeon: Algernon Huxley, MD;  Location: [facility] ORS;  Service: General;  Laterality: Left;  . APLIGRAFT PLACEMENT Left 10/15/2016   Procedure: APLIGRAFT PLACEMENT;  Surgeon: Albertine Patricia, DPM;  Location: [facility] ORS;  Service: Podiatry;  Laterality: Left;  necrotic ulcer  . CHOLECYSTECTOMY    . EYE SURGERY Bilateral    Cataract Extraction with IOL  .  HIP ARTHROPLASTY Right 10/13/2018   Procedure: ARTHROPLASTY BIPOLAR HIP (HEMIARTHROPLASTY);  Surgeon: Earnestine Leys, MD;  Location: [facility] ORS;  Service: Orthopedics;  Laterality: Right;  . IRRIGATION AND DEBRIDEMENT FOOT Left 10/15/2016   Procedure: IRRIGATION AND DEBRIDEMENT FOOT-EXCISIONAL DEBRIDEMENT OF SKIN 3RD, 4TH AND 5TH TOES WITH APPLICATION OF APLIGRAFT;  Surgeon: Albertine Patricia, DPM;  Location: [facility] ORS;   Service: Podiatry;  Laterality: Left;  necrotic,gangrene  . IRRIGATION AND DEBRIDEMENT FOOT Left 12/09/2016   Procedure: IRRIGATION AND DEBRIDEMENT FOOT-MUSCLE/FASCIA, RESECTION OF FOURTH AND FIFTH METATARSAL NECROTIC BONE;  Surgeon: Albertine Patricia, DPM;  Location: [facility] ORS;  Service: Podiatry;  Laterality: Left;  . LEFT HEART CATH AND CORONARY ANGIOGRAPHY N/A 06/05/2018   Procedure: LEFT HEART CATH AND CORONARY ANGIOGRAPHY;  Surgeon: Yolonda Kida, MD;  Location: [place] [facility];  Service: Cardiovascular;  Laterality: N/A;  . LOWER EXTREMITY ANGIOGRAPHY Left 08/16/2016   Procedure: Lower Extremity Angiography;  Surgeon: Algernon Huxley, MD;  Location: [place] [facility];  Service: Cardiovascular;  Laterality: Left;  . LOWER EXTREMITY ANGIOGRAPHY Left 09/01/2016   Procedure: Lower Extremity Angiography;  Surgeon: Algernon Huxley, MD;  Location: [place] [facility];  Service: Cardiovascular;  Laterality: Left;  . LOWER EXTREMITY ANGIOGRAPHY Left 10/14/2016   Procedure: Lower Extremity Angiography;  Surgeon: Algernon Huxley, MD;  Location: [place] [facility];  Service: Cardiovascular;  Laterality: Left;  . LOWER EXTREMITY ANGIOGRAPHY Left 12/20/2016   Procedure: Lower Extremity Angiography;  Surgeon: Algernon Huxley, MD;  Location: [place] [facility];  Service: Cardiovascular;  Laterality: Left;  . LOWER EXTREMITY ANGIOGRAPHY Right 12/18/2018   Procedure: LOWER EXTREMITY ANGIOGRAPHY;  Surgeon: Algernon Huxley, MD;  Location: [place] [facility];  Service: Cardiovascular;  Laterality: Right;  . LOWER EXTREMITY INTERVENTION  09/01/2016   Procedure: Lower Extremity Intervention;  Surgeon: Algernon Huxley, MD;  Location: [place] [facility];  Service: Cardiovascular;;  . PROSTATE SURGERY     removal  . TONSILLECTOMY     as a child    Prior to Admission medications   Medication Sig Start Date End Date Taking? Authorizing Provider  amiodarone (PACERONE) 200 MG tablet Take 200 mg by mouth daily. 09/13/18    [provider]  aspirin EC 81 MG tablet Take 1 tablet (81 mg total) by mouth daily. 12/18/18   Algernon Huxley, MD  atorvastatin (LIPITOR) 80 MG tablet Take 80 mg by mouth every evening. 07/31/18   [provider]  diltiazem (CARDIZEM CD) 120 MG 24 hr capsule Take 120 mg by mouth daily. 09/28/18   [provider]  ferrous sulfate 325 (65 FE) MG tablet Take 1 tablet (325 mg total) by mouth daily with breakfast. 10/18/18   [name] Mandes, MD  HYDROcodone-acetaminophen (NORCO/VICODIN) 5-325 MG tablet Take 1-2 tablets by mouth every 6 (six) hours as needed for moderate pain (pain score 4-6). 10/18/18   [name] Mandes, MD  insulin glargine (LANTUS) 100 UNIT/ML injection Inject 0.15 mLs (15 Units total) into the skin at bedtime. 10/18/18   [name] Mandes, MD  insulin lispro (HUMALOG) 100 UNIT/ML injection Inject 0.03 mLs (3 Units total) into the skin 3 (three) times daily with meals. Patient taking differently: Inject 3 Units into the skin 3 (three) times daily with meals. Plus 1 additional unit for every 50 BG points over 200 (max 5 additional units per dose) 11/28/17   Wieting, Richard, MD  methocarbamol (ROBAXIN) 500 MG tablet Take 1 tablet (500 mg total) by mouth every  6 (six) hours as needed for muscle spasms. 10/18/18   [name] Mandes, MD  metoprolol tartrate (LOPRESSOR) 25 MG tablet Take 1 tablet (25 mg total) by mouth 3 (three) times daily. 11/28/17   Loletha Grayer, MD  nitroGLYCERIN (NITROSTAT) 0.4 MG SL tablet Place 1 tablet (0.4 mg total) under the tongue every 5 (five) minutes as needed for chest pain. 06/09/18   Hillary Bow, MD  Rivaroxaban (XARELTO) 15 MG TABS tablet Take 1 tablet (15 mg total) by mouth daily with supper. 10/18/18   [name] Mandes, MD    Allergies Contrast media [iodinated diagnostic agents], Iohexol, Metrizamide, and Latex  Family History  Problem Relation Age of Onset  . Hypertension Mother   . Diabetes Mother   . Prostate cancer Neg Hx   . Bladder Cancer Neg Hx    . Kidney disease Neg Hx     Social History Social History   Tobacco Use  . Smoking status: Former Smoker    Packs/day: 0.25    Types: Cigarettes    Quit date: 10/14/1994    Years since quitting: 24.2  . Smokeless tobacco: Never Used  Substance Use Topics  . Alcohol use: No    Alcohol/week: 0.0 standard drinks  . Drug use: No    Review of Systems  Constitutional: Negative for fever. Eyes: Negative for visual changes. ENT: Negative for sore throat. Neck: No neck pain  Cardiovascular: + chest pain. Respiratory: + shortness of breath. Gastrointestinal: Negative for abdominal pain, vomiting or diarrhea. Genitourinary: Negative for dysuria. Musculoskeletal: Negative for back pain. Skin: Negative for rash. Neurological: Negative for headaches, weakness or numbness. Psych: No SI or HI  ____________________________________________   PHYSICAL EXAM:  VITAL SIGNS: ED Triage Vitals [12/20/18 0601]  Enc Vitals Group     BP (!) 161/70     Pulse Rate (!) 113     Resp (!) 22     Temp 98.6 F (37 C)     Temp Source Oral     SpO2 (!) 85 %     Weight      Height      Head Circumference      Peak Flow      Pain Score      Pain Loc      Pain Edu?      Excl. in [place]?     Constitutional: Alert and oriented, moderate respiratory distress. HEENT:      Head: Normocephalic and atraumatic.         Eyes: Conjunctivae are normal. Sclera is non-icteric.       Mouth/Throat: Mucous membranes are moist.       Neck: Supple with no signs of meningismus. Cardiovascular: Tachycardic with regular rhythm. No murmurs, gallops, or rubs. 2+ symmetrical distal pulses are present in all extremities. Respiratory: Increased work of breathing, tachypneic, hypoxic on room air, currently on 5 L nonrebreather Gastrointestinal: Soft, non tender, and non distended with positive bowel sounds. No rebound or guarding. Musculoskeletal: L BKA and 1+ pitting edema on the R Neurologic: Normal speech and  language. Face is symmetric. Moving all extremities. No gross focal neurologic deficits are appreciated. Skin: Skin is warm, dry and intact. No rash noted. Psychiatric: Mood and affect are normal. Speech and behavior are normal.  ____________________________________________   LABS (all labs ordered are listed, but only abnormal results are displayed)  Labs Reviewed  CBC WITH DIFFERENTIAL/PLATELET - Abnormal; Notable for the following components:      Result Value   WBC  28.3 (*)    RBC 4.00 (*)    Hemoglobin 11.6 (*)    HCT 35.4 (*)    Neutro Abs 25.8 (*)    Monocytes Absolute 1.1 (*)    Abs Immature Granulocytes 0.29 (*)    All other components within normal limits  COMPREHENSIVE METABOLIC PANEL - Abnormal; Notable for the following components:   Sodium 134 (*)    Glucose, Bld 521 (*)    BUN 28 (*)    Creatinine, Ser 1.33 (*)    Calcium 8.7 (*)    Albumin 3.1 (*)    Alkaline Phosphatase 168 (*)    GFR calc non Af Amer 50 (*)    GFR calc Af Amer 58 (*)    All other components within normal limits  BLOOD GAS, VENOUS - Abnormal; Notable for the following components:   pO2, Ven <31.0 (*)    All other components within normal limits  BRAIN NATRIURETIC PEPTIDE - Abnormal; Notable for the following components:   B Natriuretic Peptide 1,436.0 (*)    All other components within normal limits  TROPONIN I (HIGH SENSITIVITY) - Abnormal; Notable for the following components:   Troponin I (High Sensitivity) 234 (*)    All other components within normal limits  SARS CORONAVIRUS 2 (HOSPITAL ORDER, [facility])  CULTURE, BLOOD (ROUTINE X 2)  CULTURE, BLOOD (ROUTINE X 2)  LACTIC ACID, PLASMA  LACTIC ACID, PLASMA  PROCALCITONIN   ____________________________________________  EKG  ED ECG REPORT I, Rudene Re, the attending physician, personally viewed and interpreted this ECG.  Sinus tachycardia, rate of 115, normal intervals, normal axis, diffuse ST  depressions with no ST elevation.  Depressions seen on EKG from August 2019 although they look worse today. ____________________________________________  RADIOLOGY  I have personally reviewed the images performed during this visit and I agree with the Radiologist's read.   Interpretation by Radiologist:  Dg Chest Portable 1 View  Result Date: 12/20/2018 CLINICAL DATA:  Shortness of breath. EXAM: PORTABLE CHEST 1 VIEW COMPARISON:  08/13/2018. FINDINGS: Cardiomegaly. Diffuse bilateral pulmonary infiltrates/edema. Small bilateral pleural effusions. No pneumothorax. No acute bony abnormality identified. IMPRESSION: Cardiomegaly with diffuse bilateral pulmonary infiltrates and edema. Small bilateral pleural effusions. CHF could present this fashion. Pneumonitis cannot be excluded. Electronically Signed   By: Marcello Moores  Register   On: 12/20/2018 07:01      ____________________________________________   PROCEDURES  Procedure(s) performed: None Procedures Critical Care performed: yes  CRITICAL CARE Performed by: Rudene Re  ?  Total critical care time: 40 min  Critical care time was exclusive of separately billable procedures and treating other patients.  Critical care was necessary to treat or prevent imminent or life-threatening deterioration.  Critical care was time spent personally by me on the following activities: development of treatment plan with patient and/or surrogate as well as nursing, discussions with consultants, evaluation of patient's response to treatment, examination of patient, obtaining history from patient or surrogate, ordering and performing treatments and interventions, ordering and review of laboratory studies, ordering and review of radiographic studies, pulse oximetry and re-evaluation of patient's condition.  ____________________________________________   INITIAL IMPRESSION / ASSESSMENT AND PLAN / ED COURSE  84 y.o. male with history of CHF with a EF  of 45%, chronic kidney disease, diabetes, peripheral vascular disease state cancer, sleep apnea on CPAP who presents for evaluation of chest pain and respiratory distress.  Patient arrives in moderate respiratory distress, hypoxic, tachypneic, tachycardic.  Initially on a nonrebreather 15 L.  I was able to wean him down to 5 L however when switching from nonrebreather to nasal cannula patient was unable to keep his sats.  Therefore he was started on a high flow nasal cannula.  VBG showing no evidence of CO2 retention and normal pH.  Patient looks volume overloaded on imaging.  With a recent surgical procedure, patient could have received too much IV fluids.  He has had no cough or fever therefore low suspicion for infection although also possible.  PE is also in the differential diagnosis however thought to be less likely since patient is on Xarelto.  Will give Lasix, check labs and chest x-ray.  EKG showing global ST depressions with no ST elevations in the setting of tachycardia.    _________________________ 7:35 AM on 12/20/2018 -----------------------------------------  First troponin is currently at baseline for this patient.  Blood glucose elevated with no evidence of DKA.  Patient did not receive fluids due to CHF exacerbation but received IV insulin.  Patient received 40 mg of IV Lasix for CHF.  Chest x-ray showing pulmonary edema and cardiomegaly, BNP elevated.  Patient is on high flow nasal cannula and looks improved.  His COVID swab is negative.  His white count is elevated but patient has had no fever or cough.  I think this is more stress induced related to recent surgery therefore will hold off on antibiotics until procalcitonin is resulted.  I discussed with Dr. Tressia Miners who will admit patient to stepdown.    As part of my medical decision making, I reviewed the following data within the [facility] notes reviewed and incorporated, Labs reviewed , EKG interpreted ,  Old EKG reviewed, Old chart reviewed, Radiograph reviewed , Discussed with admitting physician , Notes from prior ED visits and [place] Controlled Substance Database   Patient was evaluated in Emergency Department today for the symptoms described in the history of present illness. Patient was evaluated in the context of the global COVID-19 pandemic, which necessitated consideration that the patient might be at risk for infection with the SARS-CoV-2 virus that causes COVID-19. Institutional protocols and algorithms that pertain to the evaluation of patients at risk for COVID-19 are in a state of rapid change based on information released by regulatory bodies including the CDC and federal and state organizations. These policies and algorithms were followed during the patient's care in the ED.   ____________________________________________   FINAL CLINICAL IMPRESSION(S) / ED DIAGNOSES   Final diagnoses:  Acute respiratory failure with hypoxia (HCC)  Acute on chronic congestive heart failure, unspecified heart failure type ([place])  Type 2 diabetes mellitus with hyperglycemia, unspecified whether long term insulin use ([place])      NEW MEDICATIONS STARTED DURING THIS VISIT:  ED Discharge Orders    None       Note:  This document was prepared using Dragon voice recognition software and may include unintentional dictation errors.    Alfred Levins, [place], MD 12/20/18 [phone]

## 2018-12-20 NOTE — ED Notes (Signed)
Assisted pt to get sheets changed- admitting Dr at bedside

## 2018-12-20 NOTE — Progress Notes (Signed)
[facility] Daily Progress Note   12/18/18: 1. Ultrasound guidance for vascular access left femoral artery 2. Catheter placement into right common femoral artery from left femoral approach 3. Aortogram and selective right lower extremity angiogram 4. Percutaneous transluminal angioplasty of right anterior tibial artery with 3 mm diameter angioplasty balloon 5.  Percutaneous transluminal angioplasty of the right SFA and proximal popliteal artery with a 5 mm diameter by 30 and a 5 mm diameter by 10 cm length Lutonix drug-coated angioplasty balloon             6.  Percutaneous transluminal angioplasty of the right tibioperoneal trunk and proximal peroneal artery with 3 mm diameter angioplasty balloon             7.  Viabahn stent placement to the right mid to distal SFA and proximal popliteal artery with a 6 mm diameter by 25 cm length stent 8. StarClose closure device left femoral artery  Findings: Right lower Extremity: Relatively normal common femoral artery.  The proximal SFA had about a 70% stenosis.  The vessel then normalized for several centimeters and then there was a long diffusely diseased SFA with multiple areas of nearly occlusive stenosis.  The vessel normalized in the mid popliteal artery and there was a typical tibial trifurcation with about an 80% stenosis of the proximal anterior tibial artery and a greater than 90% stenosis in the proximal peroneal artery.  The posterior tibial artery was chronically occluded.  The anterior tibial and peroneal arteries were continuous distally after the proximal disease.  Post-Procedure Findings: SFA had only about a 10% residual stenosis.  Popliteal artery with less than 10% residual stenosis.  Tibioperoneal trunk and proximal peroneal artery with less than 30% residual stenosis. The proximal to mid peroneal artery there remained a calcific lesion  that we could not cross with a balloon even with good wire access.   At this point his perfusion had been markedly improved and the procedure was terminated.  Subjective: Patient with some right lower extremity pain and swelling.  Objective: Vitals:   12/20/18 0601 12/20/18 0630 12/20/18 0830 12/20/18 1045  BP: (!) 161/70 (!) 183/84 139/70 106/71  Pulse: (!) 113 (!) 112 99 99  Resp: (!) 22 (!) 21 18 (!) 25  Temp: 98.6 F (37 C)   98.3 F (36.8 C)  TempSrc: Oral   Axillary  SpO2: (!) 85% 98% 93% 100%  Weight:    79.9 kg  Height:    5\' 7"  (1.702 m)    Intake/Output Summary (Last 24 hours) at 12/20/2018 1530 Last data filed at 12/20/2018 1300 Gross per 24 hour  Intake 4.7 ml  Output -  Net 4.7 ml   Physical Exam: A&Ox3, NAD CV: RRR Pulmonary: CTA Bilaterally Abdomen: Soft, Nontender, Nondistended Left Groin: Access site healing well Vascular:  Right Lower Extremity: Thigh soft.  Calf soft.  Extremities warm distally to the toes.  The toes become slightly cooler.  Hard to palpate pedal pulses.  There is a heel eschar noted.  No compartment syndrome noted.  Motor/sensory is intact.   Laboratory: CBC    Component Value Date/Time   WBC 28.3 (H) 12/20/2018 0602   HGB 11.6 (L) 12/20/2018 0602   HGB 16.3 01/20/2013 0255   HCT 35.4 (L) 12/20/2018 0602   HCT 47.9 01/20/2013 0255   PLT 367 12/20/2018 0602   PLT 245 01/20/2013 0255   BMET    Component Value Date/Time   NA 134 (L) 12/20/2018  0602   NA 140 01/20/2013 0255   K 4.0 12/20/2018 0602   K 3.5 01/20/2013 0255   CL 99 12/20/2018 0602   CL 103 01/20/2013 0255   CO2 23 12/20/2018 0602   CO2 30 01/20/2013 0255   GLUCOSE 521 (HH) 12/20/2018 0602   GLUCOSE 145 (H) 01/20/2013 0255   BUN 28 (H) 12/20/2018 0602   BUN 22 (H) 01/20/2013 0255   CREATININE 1.33 (H) 12/20/2018 0602   CREATININE 1.09 01/20/2013 0255   CALCIUM 8.7 (L) 12/20/2018 0602   CALCIUM 9.4 01/20/2013 0255   GFRNONAA 50 (L) 12/20/2018 0602    GFRNONAA >60 01/20/2013 0255   GFRAA 58 (L) 12/20/2018 0602   GFRAA >60 01/20/2013 0255   Assessment/Planning: The patient is an 84 year old male well-known to our service with multiple medical issues including peripheral artery disease.  The patient's most recent endovascular intervention to the right lower extremity was on Monday, December 18, 2018 1) physical exam is as expected.  Patient with some reperfusion syndrome.  There is no ischemia or compartment syndrome noted. 2) Dr. Lucky Cowboy was able to achieve a marked improvement in arterial patency during the patient's most recent endovascular intervention (12/18/18). 3) At this time, there is no indication for repeat endovascular intervention.  Discussed with Dr. Ellis Parents Ellawyn Wogan PA-C 12/20/2018 3:30 PM

## 2018-12-20 NOTE — Consult Note (Addendum)
ORTHOPAEDIC CONSULTATION  REQUESTING PHYSICIAN: [name], MD  Chief Complaint: Right heel ulceration  HPI: [name] is a 84 y.o. male who complains of chronic right heel ulceration.  Patient status post left side BKA.  Underwent NGO 2 days prior with revascularization.  Patient admitted to the ER with shortness of breath and chest pain.  Past Medical History:  Diagnosis Date  . Arthritis   . Atrial fibrillation ([place])   . Carcinoma of prostate ([place])   . CHF (congestive heart failure) ([place])   . Chronic kidney disease   . Diabetes mellitus without complication ([place])   . ED (erectile dysfunction)   . Frequent urination   . Hyperlipidemia   . Hypertension   . Moderate mitral insufficiency   . Peripheral vascular disease ([place])   . Pneumonia 04/2016  . Prostate cancer ([place])   . Sleep apnea    OSA--USE C-PAP  . Ulcer of left foot due to type 2 diabetes mellitus ([place])   . Urinary stress incontinence, male    Past Surgical History:  Procedure Laterality Date  . AMPUTATION Left 01/12/2017   Procedure: AMPUTATION BELOW KNEE;  Surgeon: Algernon Huxley, MD;  Location: [facility];  Service: General;  Laterality: Left;  . APLIGRAFT PLACEMENT Left 10/15/2016   Procedure: APLIGRAFT PLACEMENT;  Surgeon: Albertine Patricia, DPM;  Location: [facility];  Service: Podiatry;  Laterality: Left;  necrotic ulcer  . CHOLECYSTECTOMY    . EYE SURGERY Bilateral    Cataract Extraction with IOL  . HIP ARTHROPLASTY Right 10/13/2018   Procedure: ARTHROPLASTY BIPOLAR HIP (HEMIARTHROPLASTY);  Surgeon: Earnestine Leys, MD;  Location: [facility];  Service: Orthopedics;  Laterality: Right;  . IRRIGATION AND DEBRIDEMENT FOOT Left 10/15/2016   Procedure: IRRIGATION AND DEBRIDEMENT FOOT-EXCISIONAL DEBRIDEMENT OF SKIN 3RD, 4TH AND 5TH TOES WITH APPLICATION OF APLIGRAFT;  Surgeon: Albertine Patricia, DPM;  Location: [facility];  Service: Podiatry;  Laterality: Left;  necrotic,gangrene  . IRRIGATION AND  DEBRIDEMENT FOOT Left 12/09/2016   Procedure: IRRIGATION AND DEBRIDEMENT FOOT-MUSCLE/FASCIA, RESECTION OF FOURTH AND FIFTH METATARSAL NECROTIC BONE;  Surgeon: Albertine Patricia, DPM;  Location: [facility];  Service: Podiatry;  Laterality: Left;  . LEFT HEART CATH AND CORONARY ANGIOGRAPHY N/A 06/05/2018   Procedure: LEFT HEART CATH AND CORONARY ANGIOGRAPHY;  Surgeon: Yolonda Kida, MD;  Location: [place] [facility];  Service: Cardiovascular;  Laterality: N/A;  . LOWER EXTREMITY ANGIOGRAPHY Left 08/16/2016   Procedure: Lower Extremity Angiography;  Surgeon: Algernon Huxley, MD;  Location: [facility];  Service: Cardiovascular;  Laterality: Left;  . LOWER EXTREMITY ANGIOGRAPHY Left 09/01/2016   Procedure: Lower Extremity Angiography;  Surgeon: Algernon Huxley, MD;  Location: [facility];  Service: Cardiovascular;  Laterality: Left;  . LOWER EXTREMITY ANGIOGRAPHY Left 10/14/2016   Procedure: Lower Extremity Angiography;  Surgeon: Algernon Huxley, MD;  Location: [facility];  Service: Cardiovascular;  Laterality: Left;  . LOWER EXTREMITY ANGIOGRAPHY Left 12/20/2016   Procedure: Lower Extremity Angiography;  Surgeon: Algernon Huxley, MD;  Location: [facility];  Service: Cardiovascular;  Laterality: Left;  . LOWER EXTREMITY ANGIOGRAPHY Right 12/18/2018   Procedure: LOWER EXTREMITY ANGIOGRAPHY;  Surgeon: Algernon Huxley, MD;  Location: [facility];  Service: Cardiovascular;  Laterality: Right;  . LOWER EXTREMITY INTERVENTION  09/01/2016   Procedure: Lower Extremity Intervention;  Surgeon: Algernon Huxley, MD;  Location: [facility];  Service: Cardiovascular;;  . PROSTATE SURGERY     removal  .  TONSILLECTOMY     as a child   Social History   Socioeconomic History  . Marital status: Married    Spouse name: Not on file  . Number of children: Not on file  . Years of education: Not on file  . Highest education level: Not on file  Occupational History  . Occupation:  retired  Scientific laboratory technician  . Financial resource strain: Not on file  . Food insecurity    Worry: Not on file    Inability: Not on file  . Transportation needs    Medical: Not on file    Non-medical: Not on file  Tobacco Use  . Smoking status: Former Smoker    Packs/day: 0.25    Types: Cigarettes    Quit date: 10/14/1994    Years since quitting: 24.2  . Smokeless tobacco: Never Used  Substance and Sexual Activity  . Alcohol use: No    Alcohol/week: 0.0 standard drinks  . Drug use: No  . Sexual activity: Not on file  Lifestyle  . Physical activity    Days per week: Not on file    Minutes per session: Not on file  . Stress: Not on file  Relationships  . Social Herbalist on phone: Not on file    Gets together: Not on file    Attends religious service: Not on file    Active member of club or organization: Not on file    Attends meetings of clubs or organizations: Not on file    Relationship status: Not on file  Other Topics Concern  . Not on file  Social History Narrative   Lives at home with wife, has a wheelchair   Family History  Problem Relation Age of Onset  . Hypertension Mother   . Diabetes Mother   . Prostate cancer Neg Hx   . Bladder Cancer Neg Hx   . Kidney disease Neg Hx    Allergies  Allergen Reactions  . Contrast Media [Iodinated Diagnostic Agents] Shortness Of Breath    SOB  . Iohexol Shortness Of Breath     Desc: Respiratory Distress, Laryngedema, diaphoresis, Onset Date: [id number]   . Metrizamide Shortness Of Breath    SOB SOB SOB   . Latex Rash   Prior to Admission medications   Medication Sig Start Date End Date Taking? Authorizing Provider  amiodarone (PACERONE) 200 MG tablet Take 200 mg by mouth daily. 09/13/18  Yes [provider]  aspirin EC 81 MG tablet Take 1 tablet (81 mg total) by mouth daily. 12/18/18  Yes Algernon Huxley, MD  atorvastatin (LIPITOR) 80 MG tablet Take 80 mg by mouth every evening. 07/31/18  Yes [provider]  diltiazem (CARDIZEM CD) 120 MG 24 hr capsule Take 120 mg by mouth daily. 09/28/18  Yes [provider]  ferrous sulfate 325 (65 FE) MG tablet Take 1 tablet (325 mg total) by mouth daily with breakfast. 10/18/18  Yes [name] Mandes, MD  HYDROcodone-acetaminophen (NORCO/VICODIN) 5-325 MG tablet Take 1-2 tablets by mouth every 6 (six) hours as needed for moderate pain (pain score 4-6). 10/18/18  Yes [name] Mandes, MD  insulin glargine (LANTUS) 100 UNIT/ML injection Inject 0.15 mLs (15 Units total) into the skin at bedtime. 10/18/18  Yes [name] Mandes, MD  insulin lispro (HUMALOG) 100 UNIT/ML injection Inject 0.03 mLs (3 Units total) into the skin 3 (three) times daily with meals. Patient taking differently: Inject 3 Units into the skin 3 (three) times daily  with meals. Plus 1 additional unit for every 50 BG points over 200 (max 5 additional units per dose) 11/28/17  Yes Wieting, Richard, MD  metoprolol tartrate (LOPRESSOR) 25 MG tablet Take 1 tablet (25 mg total) by mouth 3 (three) times daily. 11/28/17  Yes Wieting, Richard, MD  Rivaroxaban (XARELTO) 15 MG TABS tablet Take 1 tablet (15 mg total) by mouth daily with supper. 10/18/18  Yes [name] Mandes, MD  traMADol (ULTRAM) 50 MG tablet Take 50 mg by mouth every 6 (six) hours as needed. 12/15/18  Yes [provider]  methocarbamol (ROBAXIN) 500 MG tablet Take 1 tablet (500 mg total) by mouth every 6 (six) hours as needed for muscle spasms. 10/18/18   [name] Mandes, MD  nitroGLYCERIN (NITROSTAT) 0.4 MG SL tablet Place 1 tablet (0.4 mg total) under the tongue every 5 (five) minutes as needed for chest pain. 06/09/18   Hillary Bow, MD   Dg Chest Portable 1 View  Result Date: 12/20/2018 CLINICAL DATA:  Shortness of breath. EXAM: PORTABLE CHEST 1 VIEW COMPARISON:  08/13/2018. FINDINGS: Cardiomegaly. Diffuse bilateral pulmonary infiltrates/edema. Small bilateral pleural effusions. No pneumothorax. No acute bony abnormality identified. IMPRESSION:  Cardiomegaly with diffuse bilateral pulmonary infiltrates and edema. Small bilateral pleural effusions. CHF could present this fashion. Pneumonitis cannot be excluded. Electronically Signed   By: Marcello Moores  Register   On: 12/20/2018 07:01    Positive ROS: All other systems have been reviewed and were otherwise negative with the exception of those mentioned in the HPI and as above.  12 point ROS was performed.  Physical Exam: General: Alert and oriented.  No apparent distress.  Vascular:  Left foot:Patient is status post BKA on left side  Right foot: Dorsalis Pedis:  absent Posterior Tibial:  absent  Neuro:intact gross sensation.  Patient complains of pain with palpation to the posterior right heel ulceration  Derm: There is a large necrotic ulceration on the posterior aspect of the calcaneus at the Achilles insertional site and extending along the entire posterior calcaneus.  Surrounding erythema.  No purulence.  No lymphangitic streaking.  No foul odor.     Ortho/MS: Status post left-sided BKA.  Right side with edema.  Patient is in flexion contracture at the knee.  Assessment: Severe peripheral vascular disease with full-thickness posterior right heel ulceration with necrosis  Plan: Patient is at high risk of likely amputation on the right lower extremity.  He has a very large heel necrotic ulceration.  Will discuss with vascular as far as attempts at limb salvage but this would require extensive debridement of all of the necrotic tissue with grafting and possible flap.  May need to perform MRI of the right heel to evaluate for osteomyelitis.  Patient with noted elevated white blood cell but did receive dose of Solu-Medrol and infectious disease feels elevated white blood cell count is possibly secondary to steroids.  Will follow while in-house.    Elesa Hacker, DPM Cell [phone]   12/20/2018 3:06 PM

## 2018-12-20 NOTE — Progress Notes (Signed)
ABG RESULTS  SHOWN TO DR Alfred Levins.

## 2018-12-20 NOTE — H&P (Signed)
[facility] [place] NAME: [name]    MR#:  [id number]  DATE OF BIRTH:  1934-12-27  DATE OF ADMISSION:  12/20/2018  PRIMARY CARE PHYSICIAN: Corey Skains, MD   REQUESTING/REFERRING PHYSICIAN: Dr. Rudene Re  CHIEF COMPLAINT:   Chief Complaint  Patient presents with  . Shortness of Breath    HISTORY OF PRESENT ILLNESS:  [name]  is a 84 y.o. male with a known history of atrial fibrillation on Xarelto, CAD, ischemic cardiomyopathy with EF of 35 to 40%, hypertension, peripheral vascular disease status post left BKA and right leg angioplasty 2 days ago, obstructive sleep apnea on CPAP presents to hospital secondary to confusion and worsening shortness of breath that started last night. Patient is a very poor historian.  He says he had the right heel ulcer for a long time now and is followed with wound care clinic.  He was seen by vascular and 2 days ago he had a right leg angioplasty and stent placement.  It seems prior to the procedure patient has received steroids and Pepcid for possible allergy to contrast.  He says everything went fine he went home.  Wife noticed him to be confused and talking out of his head yesterday.  Last night they had to call EMTs twice.  Initial visit patient was doing well, his confusion reversed and he did not want to come to the hospital.  Later again the confusion came back and patient was noted to be short of breath according to wife and so she called EMS again.  Patient is more alert and oriented at this time.  He does not remember the confusion episode and EMTs bring him to the hospital.  Denies any chest pain.  No fevers or chills at home.  Appears to be short of breath requiring 5 L oxygen initially in the ED.  Then changed over to high flow nasal cannula due to some dyspnea. In the ED, his sugars were elevated in the 500s, BNP is elevated in the 1400s.  Chest x-ray with diffuse pulmonary  infiltrates.  Also white count is elevated at 28.3k  PAST MEDICAL HISTORY:   Past Medical History:  Diagnosis Date  . Arthritis   . Atrial fibrillation ([place])   . Carcinoma of prostate ([place])   . CHF (congestive heart failure) ([place])   . Chronic kidney disease   . Diabetes mellitus without complication ([place])   . ED (erectile dysfunction)   . Frequent urination   . Hyperlipidemia   . Hypertension   . Moderate mitral insufficiency   . Peripheral vascular disease ([place])   . Pneumonia 04/2016  . Prostate cancer ([place])   . Sleep apnea    OSA--USE C-PAP  . Ulcer of left foot due to type 2 diabetes mellitus ([place])   . Urinary stress incontinence, male     PAST SURGICAL HISTORY:   Past Surgical History:  Procedure Laterality Date  . AMPUTATION Left 01/12/2017   Procedure: AMPUTATION BELOW KNEE;  Surgeon: Algernon Huxley, MD;  Location: [facility];  Service: General;  Laterality: Left;  . APLIGRAFT PLACEMENT Left 10/15/2016   Procedure: APLIGRAFT PLACEMENT;  Surgeon: Albertine Patricia, DPM;  Location: [facility];  Service: Podiatry;  Laterality: Left;  necrotic ulcer  . CHOLECYSTECTOMY    . EYE SURGERY Bilateral    Cataract Extraction with IOL  . HIP ARTHROPLASTY Right 10/13/2018   Procedure: ARTHROPLASTY BIPOLAR HIP (HEMIARTHROPLASTY);  Surgeon: Earnestine Leys, MD;  Location: [facility];  Service: Orthopedics;  Laterality: Right;  . IRRIGATION AND DEBRIDEMENT FOOT Left 10/15/2016   Procedure: IRRIGATION AND DEBRIDEMENT FOOT-EXCISIONAL DEBRIDEMENT OF SKIN 3RD, 4TH AND 5TH TOES WITH APPLICATION OF APLIGRAFT;  Surgeon: Albertine Patricia, DPM;  Location: [facility];  Service: Podiatry;  Laterality: Left;  necrotic,gangrene  . IRRIGATION AND DEBRIDEMENT FOOT Left 12/09/2016   Procedure: IRRIGATION AND DEBRIDEMENT FOOT-MUSCLE/FASCIA, RESECTION OF FOURTH AND FIFTH METATARSAL NECROTIC BONE;  Surgeon: Albertine Patricia, DPM;  Location: [facility];  Service: Podiatry;  Laterality: Left;  . LEFT HEART CATH AND CORONARY  ANGIOGRAPHY N/A 06/05/2018   Procedure: LEFT HEART CATH AND CORONARY ANGIOGRAPHY;  Surgeon: Yolonda Kida, MD;  Location: [facility];  Service: Cardiovascular;  Laterality: N/A;  . LOWER EXTREMITY ANGIOGRAPHY Left 08/16/2016   Procedure: Lower Extremity Angiography;  Surgeon: Algernon Huxley, MD;  Location: [facility];  Service: Cardiovascular;  Laterality: Left;  . LOWER EXTREMITY ANGIOGRAPHY Left 09/01/2016   Procedure: Lower Extremity Angiography;  Surgeon: Algernon Huxley, MD;  Location: [facility];  Service: Cardiovascular;  Laterality: Left;  . LOWER EXTREMITY ANGIOGRAPHY Left 10/14/2016   Procedure: Lower Extremity Angiography;  Surgeon: Algernon Huxley, MD;  Location: [facility] [facility];  Service: Cardiovascular;  Laterality: Left;  . LOWER EXTREMITY ANGIOGRAPHY Left 12/20/2016   Procedure: Lower Extremity Angiography;  Surgeon: Algernon Huxley, MD;  Location: [facility];  Service: Cardiovascular;  Laterality: Left;  . LOWER EXTREMITY ANGIOGRAPHY Right 12/18/2018   Procedure: LOWER EXTREMITY ANGIOGRAPHY;  Surgeon: Algernon Huxley, MD;  Location: [facility];  Service: Cardiovascular;  Laterality: Right;  . LOWER EXTREMITY INTERVENTION  09/01/2016   Procedure: Lower Extremity Intervention;  Surgeon: Algernon Huxley, MD;  Location: [facility];  Service: Cardiovascular;;  . PROSTATE SURGERY     removal  . TONSILLECTOMY     as a child    SOCIAL HISTORY:   Social History   Tobacco Use  . Smoking status: Former Smoker    Packs/day: 0.25    Types: Cigarettes    Quit date: 10/14/1994    Years since quitting: 24.2  . Smokeless tobacco: Never Used  Substance Use Topics  . Alcohol use: No    Alcohol/week: 0.0 standard drinks    FAMILY HISTORY:   Family History  Problem Relation Age of Onset  . Hypertension Mother   . Diabetes Mother   . Prostate cancer Neg Hx   . Bladder Cancer Neg Hx   . Kidney disease Neg Hx     DRUG ALLERGIES:    Allergies  Allergen Reactions  . Contrast Media [Iodinated Diagnostic Agents] Shortness Of Breath    SOB  . Iohexol Shortness Of Breath     Desc: Respiratory Distress, Laryngedema, diaphoresis, Onset Date: [id number]   . Metrizamide Shortness Of Breath    SOB SOB SOB   . Latex Rash    REVIEW OF SYSTEMS:   Review of Systems  Constitutional: Positive for malaise/fatigue. Negative for chills, fever and weight loss.  HENT: Negative for ear discharge, ear pain, hearing loss, nosebleeds and tinnitus.   Eyes: Negative for blurred vision, double vision and photophobia.  Respiratory: Positive for shortness of breath. Negative for cough, hemoptysis and wheezing.   Cardiovascular: Negative for chest pain, palpitations, orthopnea and leg swelling.  Gastrointestinal: Negative for abdominal pain, constipation, diarrhea, heartburn, melena, nausea and vomiting.  Genitourinary: Negative for dysuria, frequency and urgency.  Musculoskeletal: Positive for  myalgias. Negative for back pain and neck pain.  Skin: Negative for rash.  Neurological: Negative for dizziness, sensory change, speech change, focal weakness and headaches.  Endo/Heme/Allergies: Does not bruise/bleed easily.  Psychiatric/Behavioral: Negative for depression.       Confusion    MEDICATIONS AT HOME:   Prior to Admission medications   Medication Sig Start Date End Date Taking? Authorizing Provider  amiodarone (PACERONE) 200 MG tablet Take 200 mg by mouth daily. 09/13/18  Yes [provider]  aspirin EC 81 MG tablet Take 1 tablet (81 mg total) by mouth daily. 12/18/18  Yes Algernon Huxley, MD  atorvastatin (LIPITOR) 80 MG tablet Take 80 mg by mouth every evening. 07/31/18  Yes [provider]  diltiazem (CARDIZEM CD) 120 MG 24 hr capsule Take 120 mg by mouth daily. 09/28/18  Yes [provider]  ferrous sulfate 325 (65 FE) MG tablet Take 1 tablet (325 mg total) by mouth daily with breakfast. 10/18/18  Yes [name] Mandes, MD  HYDROcodone-acetaminophen (NORCO/VICODIN) 5-325 MG tablet Take 1-2 tablets by mouth every 6 (six) hours as needed for moderate pain (pain score 4-6). 10/18/18  Yes [name] Mandes, MD  insulin glargine (LANTUS) 100 UNIT/ML injection Inject 0.15 mLs (15 Units total) into the skin at bedtime. 10/18/18  Yes [name] Mandes, MD  insulin lispro (HUMALOG) 100 UNIT/ML injection Inject 0.03 mLs (3 Units total) into the skin 3 (three) times daily with meals. Patient taking differently: Inject 3 Units into the skin 3 (three) times daily with meals. Plus 1 additional unit for every 50 BG points over 200 (max 5 additional units per dose) 11/28/17  Yes Wieting, Richard, MD  metoprolol tartrate (LOPRESSOR) 25 MG tablet Take 1 tablet (25 mg total) by mouth 3 (three) times daily. 11/28/17  Yes Wieting, Richard, MD  Rivaroxaban (XARELTO) 15 MG TABS tablet Take 1 tablet (15 mg total) by mouth daily with supper. 10/18/18  Yes [name] Mandes, MD  traMADol (ULTRAM) 50 MG tablet Take 50 mg by mouth every 6 (six) hours as needed. 12/15/18  Yes [provider]  methocarbamol (ROBAXIN) 500 MG tablet Take 1 tablet (500 mg total) by mouth every 6 (six) hours as needed for muscle spasms. 10/18/18   [name] Mandes, MD  nitroGLYCERIN (NITROSTAT) 0.4 MG SL tablet Place 1 tablet (0.4 mg total) under the tongue every 5 (five) minutes as needed for chest pain. 06/09/18   Hillary Bow, MD      VITAL SIGNS:  Blood pressure 106/71, pulse 99, temperature 98.3 F (36.8 C), temperature source Axillary, resp. rate (!) 25, height 5\' 7"  (1.702 m), weight 79.9 kg, SpO2 100 %.  PHYSICAL EXAMINATION:  Physical Exam  GENERAL:  84 y.o.-year-old patient lying in the bed with no acute distress.  EYES: Pupils equal, round, reactive to light and accommodation. No scleral icterus. Extraocular muscles intact.  HEENT: Head atraumatic, normocephalic. Oropharynx and nasopharynx clear.  NECK:  Supple, no jugular venous distention. No thyroid  enlargement, no tenderness.  LUNGS: Normal breath sounds bilaterally, bibasilar crackles, no wheezing, rhonchi or crepitation. No use of accessory muscles of respiration. Some tahypnea on talking CARDIOVASCULAR: S1, S2 normal. No  rubs, or gallops. 3/6 systolic murmur present ABDOMEN: Soft, nontender, nondistended. Bowel sounds present. No organomegaly or mass.  EXTREMITIES: s/p left BKA and right foot swelling with a medial heel ulcer. Feeble DP pulses on right foot NEUROLOGIC: Cranial nerves II through XII are intact. Muscle strength 5/5 in all extremities. Sensation intact. Gait not checked.  PSYCHIATRIC: The patient is alert and oriented x 3.  SKIN: No obvious rash, lesion, or ulcer.       LABORATORY PANEL:   CBC Recent Labs  Lab 12/20/18 0602  WBC 28.3*  HGB 11.6*  HCT 35.4*  PLT 367   ------------------------------------------------------------------------------------------------------------------  Chemistries  Recent Labs  Lab 12/20/18 0602  NA 134*  K 4.0  CL 99  CO2 23  GLUCOSE 521*  BUN 28*  CREATININE 1.33*  CALCIUM 8.7*  AST 23  ALT 22  ALKPHOS 168*  BILITOT 1.2   ------------------------------------------------------------------------------------------------------------------  Cardiac Enzymes No results for input(s): TROPONINI in the last 168 hours. ------------------------------------------------------------------------------------------------------------------  RADIOLOGY:  Dg Chest Portable 1 View  Result Date: 12/20/2018 CLINICAL DATA:  Shortness of breath. EXAM: PORTABLE CHEST 1 VIEW COMPARISON:  08/13/2018. FINDINGS: Cardiomegaly. Diffuse bilateral pulmonary infiltrates/edema. Small bilateral pleural effusions. No pneumothorax. No acute bony abnormality identified. IMPRESSION: Cardiomegaly with diffuse bilateral pulmonary infiltrates and edema. Small bilateral pleural effusions. CHF could present this fashion. Pneumonitis cannot be excluded.  Electronically Signed   By: Marcello Moores  Register   On: 12/20/2018 07:01      IMPRESSION AND PLAN:   [name]  is a 84 y.o. male with a known history of atrial fibrillation on Xarelto, CAD, ischemic cardiomyopathy with EF of 35 to 40%, hypertension, peripheral vascular disease status post left BKA and right leg angioplasty 2 days ago, obstructive sleep apnea on CPAP presents to hospital secondary to confusion and worsening shortness of breath that started last night.  1.  Acute hypoxic respiratory failure-secondary to acute pulmonary edema. -Acute on chronic systolic heart failure.  Last cardiac cath was in January 2020 showing chronic occlusions and collaterals from LAD.  Medical management recommended.  EF was around 35%. -Received IV Lasix in ED, admit to stepdown. -On high flow nasal cannula.  Continue IV Lasix.  Strict input and output monitoring and daily weights.  2.  Diabetic right heel ulcer-appears necrotic, ischemic ulcer.  Status post recent revascularization and arterial stents placement -We will consult ID and podiatry. -Will await ID input prior to starting antibiotics.  Ordered blood cultures and wound cultures. -WBC elevated, patient also received high-dose Solu-Medrol 2 days ago.  Continue to monitor at this time.  No fevers or chills.  But does have tachycardia on admission.  3.  Uncontrolled diabetes mellitus-check A1c, started on insulin drip while in the ICU  4.  A. fib-rate controlled.  Continue cardiac medications.  Patient on Cardizem, metoprolol and amiodarone -Xarelto on hold for possible procedure.  Started on heparin drip  5.  CKD stage III-stable.  Continue to monitor  6.  DVT prophylaxis-on heparin drip   Will need PT consult after podiatry evaluation and procedures.  Wheelchair-bound at baseline   All the records are reviewed and case discussed with ED provider. Management plans discussed with the patient, family and they are in agreement.  CODE  STATUS: Full Code  TOTAL TIME TAKING CARE OF THIS PATIENT: 52 minutes.    [name] M.D on 12/20/2018 at 11:56 AM  Between 7am to 6pm - Pager - [phone]  After 6pm go to [email or web address] - Proofreader  [facility]  Office  [phone]  CC: Primary care physician; Corey Skains, MD   Note: This dictation was prepared with Dragon dictation along with smaller phrase technology. Any transcriptional errors that result from this process are unintentional.

## 2018-12-20 NOTE — ED Triage Notes (Addendum)
Pt arrived via EMS from home where pt was in respiratory distress. EMS reported pt lying on floor, SOB, sat 88% RA. Pt denied fall. Pt has hx/o CHF. Pt arrived to ED with non-re breather in place, 98%, labored breathing, A&O x4. Pt had stent placements in right foot x2 days ago. Pt received 125mg  Solumedrol, 1 duoneb in route.

## 2018-12-20 NOTE — Consult Note (Addendum)
NAME: [name]  DOB: 1934-06-24  MRN: [id number]  Date/Time: 12/20/2018 10:18 AM  REQUESTING PROVIDER: Tressia Miners Subjective:  REASON FOR CONSULT: Right foot infection. ?Patient is a vague historian.  Chart reviewed. [name] is a 84 y.o. male with a history of diabetes mellitus, peripheral artery disease, status post left BKA, chronic right heel wound, A. fib, congestive heart failure, presented to the ED via EMS with respiratory distress.  EMS found him on the floor short of breath with sats of 88% room air.  He was placed on nonrebreather and also received Solu-Medrol 1 dose of 125 mg and DuoNeb on the way. As per the ED note patient reported chest pain that woke him up from sleep.  It was also a severe shortness of breath.  He asked his wife for nitro and the pain did not improve with nitro which prompted him to call 911.   Patient underwent angiogram on 12/18/2018 and had percutaneous transluminal angioplasty of the right anterior tibial artery, right SFA and proximal popliteal artery and right tibioperoneal trunk and proximal peroneal artery with angioplasty balloon.  He also had a stent placement to the right mid to distal SFA and proximal popliteal artery with a 6 mm diameter by 25 cm length stent.  He got a dose of methylprednisone 125 mg before the procedure as he has a history of shortness of breath with contrast.   In the ED his blood pressure was 161/70, heart rate of 113, respiratory rate of 22 and a pulse ox of 85% with a temperature of 98.6. Labs showed a blood glucose of 521, creatinine of 1.33, CO2 of 23, BNP of 1436, Lactate of 2.8, pro-Cal of 0.16, WBC of 28.3, hemoglobin of 11.6 and platelet of 367. Chest x-ray showed pulmonary edema. He was noted to have some discharge from the right heel wound which is chronic and I am asked to see the patient for the same.  Blood cultures have been sent . Patient says he has had the black lesion on his right heel for a  few months now. Denies any fever Has pain in his right foot Past Medical History:  Diagnosis Date  . Arthritis   . Atrial fibrillation ([place])   . Carcinoma of prostate ([place])   . CHF (congestive heart failure) ([place])   . Chronic kidney disease   . Diabetes mellitus without complication ([place])   . ED (erectile dysfunction)   . Frequent urination   . Hyperlipidemia   . Hypertension   . Moderate mitral insufficiency   . Peripheral vascular disease ([place])   . Pneumonia 04/2016  . Prostate cancer ([place])   . Sleep apnea    OSA--USE C-PAP  . Ulcer of left foot due to type 2 diabetes mellitus ([place])   . Urinary stress incontinence, male     Past Surgical History:  Procedure Laterality Date  . AMPUTATION Left 01/12/2017   Procedure: AMPUTATION BELOW KNEE;  Surgeon: Algernon Huxley, MD;  Location: [facility];  Service: General;  Laterality: Left;  . APLIGRAFT PLACEMENT Left 10/15/2016   Procedure: APLIGRAFT PLACEMENT;  Surgeon: Albertine Patricia, DPM;  Location: [facility];  Service: Podiatry;  Laterality: Left;  necrotic ulcer  . CHOLECYSTECTOMY    . EYE SURGERY Bilateral    Cataract Extraction with IOL  . HIP ARTHROPLASTY Right 10/13/2018   Procedure: ARTHROPLASTY BIPOLAR HIP (HEMIARTHROPLASTY);  Surgeon: Earnestine Leys, MD;  Location: [facility];  Service: Orthopedics;  Laterality: Right;  . IRRIGATION AND DEBRIDEMENT  FOOT Left 10/15/2016   Procedure: IRRIGATION AND DEBRIDEMENT FOOT-EXCISIONAL DEBRIDEMENT OF SKIN 3RD, 4TH AND 5TH TOES WITH APPLICATION OF APLIGRAFT;  Surgeon: Albertine Patricia, DPM;  Location: [facility];  Service: Podiatry;  Laterality: Left;  necrotic,gangrene  . IRRIGATION AND DEBRIDEMENT FOOT Left 12/09/2016   Procedure: IRRIGATION AND DEBRIDEMENT FOOT-MUSCLE/FASCIA, RESECTION OF FOURTH AND FIFTH METATARSAL NECROTIC BONE;  Surgeon: Albertine Patricia, DPM;  Location: [facility];  Service: Podiatry;  Laterality: Left;  . LEFT HEART CATH AND CORONARY ANGIOGRAPHY N/A 06/05/2018   Procedure: LEFT  HEART CATH AND CORONARY ANGIOGRAPHY;  Surgeon: Yolonda Kida, MD;  Location: [facility];  Service: Cardiovascular;  Laterality: N/A;  . LOWER EXTREMITY ANGIOGRAPHY Left 08/16/2016   Procedure: Lower Extremity Angiography;  Surgeon: Algernon Huxley, MD;  Location: [facility];  Service: Cardiovascular;  Laterality: Left;  . LOWER EXTREMITY ANGIOGRAPHY Left 09/01/2016   Procedure: Lower Extremity Angiography;  Surgeon: Algernon Huxley, MD;  Location: [facility];  Service: Cardiovascular;  Laterality: Left;  . LOWER EXTREMITY ANGIOGRAPHY Left 10/14/2016   Procedure: Lower Extremity Angiography;  Surgeon: Algernon Huxley, MD;  Location: [facility];  Service: Cardiovascular;  Laterality: Left;  . LOWER EXTREMITY ANGIOGRAPHY Left 12/20/2016   Procedure: Lower Extremity Angiography;  Surgeon: Algernon Huxley, MD;  Location: [facility];  Service: Cardiovascular;  Laterality: Left;  . LOWER EXTREMITY ANGIOGRAPHY Right 12/18/2018   Procedure: LOWER EXTREMITY ANGIOGRAPHY;  Surgeon: Algernon Huxley, MD;  Location: [facility];  Service: Cardiovascular;  Laterality: Right;  . LOWER EXTREMITY INTERVENTION  09/01/2016   Procedure: Lower Extremity Intervention;  Surgeon: Algernon Huxley, MD;  Location: [facility];  Service: Cardiovascular;;  . PROSTATE SURGERY     removal  . TONSILLECTOMY     as a child    Social History   Socioeconomic History  . Marital status: Married    Spouse name: Not on file  . Number of children: Not on file  . Years of education: Not on file  . Highest education level: Not on file  Occupational History  . Occupation: retired  Scientific laboratory technician  . Financial resource strain: Not on file  . Food insecurity    Worry: Not on file    Inability: Not on file  . Transportation needs    Medical: Not on file    Non-medical: Not on file  Tobacco Use  . Smoking status: Former Smoker    Packs/day: 0.25    Types: Cigarettes    Quit date:  10/14/1994    Years since quitting: 24.2  . Smokeless tobacco: Never Used  Substance and Sexual Activity  . Alcohol use: No    Alcohol/week: 0.0 standard drinks  . Drug use: No  . Sexual activity: Not on file  Lifestyle  . Physical activity    Days per week: Not on file    Minutes per session: Not on file  . Stress: Not on file  Relationships  . Social Herbalist on phone: Not on file    Gets together: Not on file    Attends religious service: Not on file    Active member of club or organization: Not on file    Attends meetings of clubs or organizations: Not on file    Relationship status: Not on file  . Intimate partner violence    Fear of current or ex partner: Not on file    Emotionally  abused: Not on file    Physically abused: Not on file    Forced sexual activity: Not on file  Other Topics Concern  . Not on file  Social History Narrative   Lives at home with wife, has a wheelchair    Family History  Problem Relation Age of Onset  . Hypertension Mother   . Diabetes Mother   . Prostate cancer Neg Hx   . Bladder Cancer Neg Hx   . Kidney disease Neg Hx    Allergies  Allergen Reactions  . Contrast Media [Iodinated Diagnostic Agents] Shortness Of Breath    SOB  . Iohexol Shortness Of Breath     Desc: Respiratory Distress, Laryngedema, diaphoresis, Onset Date: [id number]   . Metrizamide Shortness Of Breath    SOB SOB SOB   . Latex Rash    ? Current Facility-Administered Medications  Medication Dose Route Frequency Provider Last Rate Last Dose  . acetaminophen (TYLENOL) tablet 650 mg  650 mg Oral Q6H PRN [name], MD       Or  . acetaminophen (TYLENOL) suppository 650 mg  650 mg Rectal Q6H PRN [name], MD      . amiodarone (PACERONE) tablet 200 mg  200 mg Oral Daily [name], MD      . aspirin EC tablet 81 mg  81 mg Oral Daily [name], MD      . atorvastatin (LIPITOR) tablet 80 mg  80 mg Oral QPM [name], MD      . diltiazem (CARDIZEM CD) 24 hr capsule 120 mg  120 mg Oral Daily [name], MD      . diphenhydrAMINE (BENADRYL) injection 50 mg  50 mg Intravenous Once PRN Stegmayer, Janalyn Harder, PA-C      . famotidine (PEPCID) tablet 40 mg  40 mg Oral Once PRN Stegmayer, Janalyn Harder, PA-C      . [START ON 12/21/2018] ferrous sulfate tablet 325 mg  325 mg Oral Q breakfast [name], MD      . furosemide (LASIX) injection 40 mg  40 mg Intravenous Q12H [name], MD      . HYDROcodone-acetaminophen (NORCO/VICODIN) 5-325 MG per tablet 1-2 tablet  1-2 tablet Oral Q6H PRN [name], MD      . insulin aspart (novoLOG) injection 0-15 Units  0-15 Units Subcutaneous TID WC [name], MD      . insulin aspart (novoLOG) injection 0-5 Units  0-5 Units Subcutaneous QHS [name], MD      . insulin glargine (LANTUS) injection 25 Units  25 Units Subcutaneous Daily [name], MD      . methocarbamol (ROBAXIN) tablet 500 mg  500 mg Oral Q6H PRN [name], MD      . methylPREDNISolone sodium succinate (SOLU-MEDROL) 130 mg in sodium chloride 0.9 % 50 mL IVPB  130 mg Intravenous Once PRN Stegmayer, Kimberly A, PA-C      . metoprolol tartrate (LOPRESSOR) tablet 25 mg  25 mg Oral TID [name], MD      . midazolam (VERSED) 2 MG/ML syrup 8 mg  8 mg Oral Once PRN Stegmayer, Kimberly A, PA-C      . nitroGLYCERIN (NITROSTAT) SL tablet 0.4 mg  0.4 mg Sublingual Q5 min PRN [name], MD      . ondansetron (ZOFRAN) tablet 4 mg  4 mg Oral Q6H PRN [name], MD       Or  . ondansetron (ZOFRAN) injection 4 mg  4 mg Intravenous Q6H  PRN [name], MD       Current Outpatient Medications  Medication Sig Dispense Refill  . amiodarone (PACERONE) 200 MG tablet Take 200 mg by mouth daily.    Marland Kitchen aspirin EC 81 MG tablet Take 1 tablet (81 mg total) by mouth daily. 150 tablet 2  . atorvastatin (LIPITOR) 80 MG tablet Take 80 mg by  mouth every evening.    . diltiazem (CARDIZEM CD) 120 MG 24 hr capsule Take 120 mg by mouth daily.    . ferrous sulfate 325 (65 FE) MG tablet Take 1 tablet (325 mg total) by mouth daily with breakfast. 30 tablet 3  . HYDROcodone-acetaminophen (NORCO/VICODIN) 5-325 MG tablet Take 1-2 tablets by mouth every 6 (six) hours as needed for moderate pain (pain score 4-6). 30 tablet 0  . insulin glargine (LANTUS) 100 UNIT/ML injection Inject 0.15 mLs (15 Units total) into the skin at bedtime. 1 vial 0  . insulin lispro (HUMALOG) 100 UNIT/ML injection Inject 0.03 mLs (3 Units total) into the skin 3 (three) times daily with meals. (Patient taking differently: Inject 3 Units into the skin 3 (three) times daily with meals. Plus 1 additional unit for every 50 BG points over 200 (max 5 additional units per dose)) 10 mL 0  . metoprolol tartrate (LOPRESSOR) 25 MG tablet Take 1 tablet (25 mg total) by mouth 3 (three) times daily. 90 tablet 0  . Rivaroxaban (XARELTO) 15 MG TABS tablet Take 1 tablet (15 mg total) by mouth daily with supper. 30 tablet 1  . traMADol (ULTRAM) 50 MG tablet Take 50 mg by mouth every 6 (six) hours as needed.    . methocarbamol (ROBAXIN) 500 MG tablet Take 1 tablet (500 mg total) by mouth every 6 (six) hours as needed for muscle spasms. 15 tablet 0  . nitroGLYCERIN (NITROSTAT) 0.4 MG SL tablet Place 1 tablet (0.4 mg total) under the tongue every 5 (five) minutes as needed for chest pain. 20 tablet 0     Abtx:  Anti-infectives (From admission, onward)   None      REVIEW OF SYSTEMS:  Const: negative fever, negative chills, negative weight loss Eyes: negative diplopia or visual changes, negative eye pain ENT: negative coryza, negative sore throat Resp: negative cough, hemoptysis, has dyspnea Cards:  chest pain, palpitations, no lower extremity edema GU: negative for frequency, dysuria and hematuria GI: Negative for abdominal pain, diarrhea, bleeding, constipation Skin: negative for  rash and pruritus Heme: negative for easy bruising and gum/nose bleeding MS: Generalized weakness Neurolo: Does not complain of headache or dizziness. Psych: negative for feelings of anxiety, depression  Allergy/Immunology-as listed above ? Pertinent Positives include : Objective:  VITALS:  BP 139/70   Pulse 99   Temp 98.6 F (37 C) (Oral)   Resp 18   SpO2 93%  PHYSICAL EXAM:  General: Alert, cooperative, no distress, appears young for age.  Some confusion, slow response at times Head: Normocephalic, without obvious abnormality, atraumatic. Eyes: Conjunctivae clear, anicteric sclerae. Pupils are equal ENT Nares normal. No drainage or sinus tenderness. Lips, mucosa, and tongue normal. No Thrush Neck: Supple,. Back: Did not examine Lungs: Bilateral air entry, spaces Heart: Irregular Abdomen: Soft, non-tender,not distended. Bowel sounds normal. No masses Extremities: Right heel has a eschar covered pressure ulcer surrounding skin is a little macerated.  Minimal erythema around    Left BKA  Skin: No rashes or lesions. Or bruising Lymph: Cervical, supraclavicular normal. Neurologic: Grossly non-focal Pertinent Labs Lab Results CBC    Component  Value Date/Time   WBC 28.3 (H) 12/20/2018 0602   RBC 4.00 (L) 12/20/2018 0602   HGB 11.6 (L) 12/20/2018 0602   HGB 16.3 01/20/2013 0255   HCT 35.4 (L) 12/20/2018 0602   HCT 47.9 01/20/2013 0255   PLT 367 12/20/2018 0602   PLT 245 01/20/2013 0255   MCV 88.5 12/20/2018 0602   MCV 84 01/20/2013 0255   MCH 29.0 12/20/2018 0602   MCHC 32.8 12/20/2018 0602   RDW 14.2 12/20/2018 0602   RDW 14.5 01/20/2013 0255   LYMPHSABS 1.1 12/20/2018 0602   LYMPHSABS 1.6 01/20/2013 0255   MONOABS 1.1 (H) 12/20/2018 0602   MONOABS 1.0 01/20/2013 0255   EOSABS 0.0 12/20/2018 0602   EOSABS 0.8 (H) 01/20/2013 0255   BASOSABS 0.1 12/20/2018 0602   BASOSABS 0.1 01/20/2013 0255    CMP Latest Ref Rng & Units 12/20/2018 12/18/2018 10/18/2018  Glucose  70 - 99 mg/dL 521(HH) 323(H) 238(H)  BUN 8 - 23 mg/dL 28(H) 18 55(H)  Creatinine 0.61 - 1.24 mg/dL 1.33(H) 1.28(H) 2.36(H)  Sodium 135 - 145 mmol/L 134(L) 135 137  Potassium 3.5 - 5.1 mmol/L 4.0 4.1 4.6  Chloride 98 - 111 mmol/L 99 103 109  CO2 22 - 32 mmol/L 23 21(L) 21(L)  Calcium 8.9 - 10.3 mg/dL 8.7(L) 8.5(L) 8.0(L)  Total Protein 6.5 - 8.1 g/dL 7.8 - -  Total Bilirubin 0.3 - 1.2 mg/dL 1.2 - -  Alkaline Phos 38 - 126 U/L 168(H) - -  AST 15 - 41 U/L 23 - -  ALT 0 - 44 U/L 22 - -      Microbiology: Recent Results (from the past 240 hour(s))  SARS Coronavirus 2 (Performed in [place] hospital lab)     Status: None   Collection Time: 12/15/18  1:47 PM   Specimen: Nasal Swab  Result Value Ref Range Status   SARS Coronavirus 2 NEGATIVE NEGATIVE Final    Comment: (NOTE) SARS-CoV-2 target nucleic acids are NOT DETECTED. The SARS-CoV-2 RNA is generally detectable in upper and lower respiratory specimens during the acute phase of infection. Negative results do not preclude SARS-CoV-2 infection, do not rule out co-infections with other pathogens, and should not be used as the sole basis for treatment or other patient management decisions. Negative results must be combined with clinical observations, patient history, and epidemiological information. The expected result is Negative. Fact Sheet for Patients: SugarRoll.be Fact Sheet for Healthcare Providers: https://www.woods-mathews.com/ This test is not yet approved or cleared by the [place] FDA and  has been authorized for detection and/or diagnosis of SARS-CoV-2 by FDA under an Emergency Use Authorization (EUA). This EUA will remain  in effect (meaning this test can be used) for the duration of the COVID-19 declaration under Section 56 4(b)(1) of the Act, 21 U.S.C. section 360bbb-3(b)(1), unless the authorization is terminated or revoked sooner. Performed at [facility], [place] [address], [place], [place] [address]   SARS Coronavirus 2 (CEPHEID- Performed in [facility] hospital lab), Hosp Order     Status: None   Collection Time: 12/20/18  6:02 AM   Specimen: Nasopharyngeal Swab  Result Value Ref Range Status   SARS Coronavirus 2 NEGATIVE NEGATIVE Final    Comment: (NOTE) If result is NEGATIVE SARS-CoV-2 target nucleic acids are NOT DETECTED. The SARS-CoV-2 RNA is generally detectable in upper and lower  respiratory specimens during the acute phase of infection. The lowest  concentration of SARS-CoV-2 viral copies this assay can detect is 250  copies / mL. A negative result does not preclude SARS-CoV-2 infection  and should not be used as the sole basis for treatment or other  patient management decisions.  A negative result may occur with  improper specimen collection / handling, submission of specimen other  than nasopharyngeal swab, presence of viral mutation(s) within the  areas targeted by this assay, and inadequate number of viral copies  (<250 copies / mL). A negative result must be combined with clinical  observations, patient history, and epidemiological information. If result is POSITIVE SARS-CoV-2 target nucleic acids are DETECTED. The SARS-CoV-2 RNA is generally detectable in upper and lower  respiratory specimens dur ing the acute phase of infection.  Positive  results are indicative of active infection with SARS-CoV-2.  Clinical  correlation with patient history and other diagnostic information is  necessary to determine patient infection status.  Positive results do  not rule out bacterial infection or co-infection with other viruses. If result is PRESUMPTIVE POSTIVE SARS-CoV-2 nucleic acids MAY BE PRESENT.   A presumptive positive result was obtained on the submitted specimen  and confirmed on repeat testing.  While 2019 novel coronavirus  (SARS-CoV-2) nucleic acids may be present in the submitted sample  additional confirmatory  testing may be necessary for epidemiological  and / or clinical management purposes  to differentiate between  SARS-CoV-2 and other Sarbecovirus currently known to infect humans.  If clinically indicated additional testing with an alternate test  methodology [phone]) is advised. The SARS-CoV-2 RNA is generally  detectable in upper and lower respiratory sp ecimens during the acute  phase of infection. The expected result is Negative. Fact Sheet for Patients:  StrictlyIdeas.no Fact Sheet for Healthcare Providers: BankingDealers.co.za This test is not yet approved or cleared by the [place] FDA and has been authorized for detection and/or diagnosis of SARS-CoV-2 by FDA under an Emergency Use Authorization (EUA).  This EUA will remain in effect (meaning this test can be used) for the duration of the COVID-19 declaration under Section 564(b)(1) of the Act, 21 U.S.C. section 360bbb-3(b)(1), unless the authorization is terminated or revoked sooner. Performed at [facility], [address] [place], [place] [address]     IMAGING RESULTS:  I have personally reviewed the films ? Impression/Recommendation 84 year old male with history of CHF, A. fib, peripheral artery disease, right heel ischemic ulcer, recent angiogram and angioplasty and stent placement to the right leg presents with shortness of breath and chest pain.  Acute pulmonary edema secondary to congestive heart failure: On furosemide.  Management as per primary team  Chronic right heel ulcer which is necrotic and is a pressure ulcer but he has underlying peripheral artery disease as well.  Does not look overtly infected because of leukocytosis will start Zosyn.  Cultures have already been sent.   A. fib on amiodarone, diltiazem   Leukocytosis: Steroid could be contributing to it so could be an ischemic ulcer.Marland Kitchen  He did receive methylprednisone on 12/18/2018 before  the angiogram.  ? ? _Left BKA.  __________________________________________________ Discussed with patient, requesting provider Note:  This document was prepared using Dragon voice recognition software and may include unintentional dictation errors.

## 2018-12-21 ENCOUNTER — Inpatient Hospital Stay: Payer: Medicare Other

## 2018-12-21 ENCOUNTER — Ambulatory Visit: Payer: Medicare Other | Admitting: Physician Assistant

## 2018-12-21 DIAGNOSIS — B965 Pseudomonas (aeruginosa) (mallei) (pseudomallei) as the cause of diseases classified elsewhere: Secondary | ICD-10-CM

## 2018-12-21 DIAGNOSIS — L97418 Non-pressure chronic ulcer of right heel and midfoot with other specified severity: Secondary | ICD-10-CM

## 2018-12-21 DIAGNOSIS — M6518 Other infective (teno)synovitis, other site: Secondary | ICD-10-CM

## 2018-12-21 DIAGNOSIS — I739 Peripheral vascular disease, unspecified: Secondary | ICD-10-CM

## 2018-12-21 DIAGNOSIS — M869 Osteomyelitis, unspecified: Secondary | ICD-10-CM

## 2018-12-21 LAB — GLUCOSE, CAPILLARY
Glucose-Capillary: 133 mg/dL — ABNORMAL HIGH (ref 70–99)
Glucose-Capillary: 145 mg/dL — ABNORMAL HIGH (ref 70–99)
Glucose-Capillary: 155 mg/dL — ABNORMAL HIGH (ref 70–99)
Glucose-Capillary: 167 mg/dL — ABNORMAL HIGH (ref 70–99)
Glucose-Capillary: 178 mg/dL — ABNORMAL HIGH (ref 70–99)
Glucose-Capillary: 273 mg/dL — ABNORMAL HIGH (ref 70–99)
Glucose-Capillary: 399 mg/dL — ABNORMAL HIGH (ref 70–99)

## 2018-12-21 LAB — APTT
aPTT: 41 seconds — ABNORMAL HIGH (ref 24–36)
aPTT: 58 seconds — ABNORMAL HIGH (ref 24–36)
aPTT: 37 seconds — ABNORMAL HIGH (ref 24–36)
aPTT: 46 seconds — ABNORMAL HIGH (ref 24–36)
aPTT: 52 seconds — ABNORMAL HIGH (ref 24–36)

## 2018-12-21 LAB — BASIC METABOLIC PANEL
Anion gap: 8 (ref 5–15)
BUN: 33 mg/dL — ABNORMAL HIGH (ref 8–23)
CO2: 29 mmol/L (ref 22–32)
Calcium: 8.2 mg/dL — ABNORMAL LOW (ref 8.9–10.3)
Chloride: 99 mmol/L (ref 98–111)
Creatinine, Ser: 1.32 mg/dL — ABNORMAL HIGH (ref 0.61–1.24)
GFR calc Af Amer: 58 mL/min — ABNORMAL LOW (ref >60)
GFR calc non Af Amer: 50 mL/min — ABNORMAL LOW (ref >60)
Glucose, Bld: 177 mg/dL — ABNORMAL HIGH (ref 70–99)
Potassium: 3.5 mmol/L (ref 3.5–5.1)
Sodium: 136 mmol/L (ref 135–145)

## 2018-12-21 LAB — MAGNESIUM: Magnesium: 1.9 mg/dL (ref 1.7–2.4)

## 2018-12-21 LAB — CBC
HCT: 29 % — ABNORMAL LOW (ref 39.0–52.0)
Hemoglobin: 9.8 g/dL — ABNORMAL LOW (ref 13.0–17.0)
MCH: 29.2 pg (ref 26.0–34.0)
MCHC: 33.8 g/dL (ref 30.0–36.0)
MCV: 86.3 fL (ref 80.0–100.0)
Platelets: 299 10*3/uL (ref 150–400)
RBC: 3.36 MIL/uL — ABNORMAL LOW (ref 4.22–5.81)
RDW: 14.5 % (ref 11.5–15.5)
WBC: 22.8 10*3/uL — ABNORMAL HIGH (ref 4.0–10.5)
nRBC: 0 % (ref 0.0–0.2)

## 2018-12-21 LAB — PROCALCITONIN: Procalcitonin: 1 ng/mL

## 2018-12-21 LAB — PHOSPHORUS: Phosphorus: 3.3 mg/dL (ref 2.5–4.6)

## 2018-12-21 LAB — SEDIMENTATION RATE: Sed Rate: 81 mm/hr — ABNORMAL HIGH (ref 0–20)

## 2018-12-21 LAB — BRAIN NATRIURETIC PEPTIDE: B Natriuretic Peptide: 1436 pg/mL — ABNORMAL HIGH (ref 0.0–100.0)

## 2018-12-21 LAB — C-REACTIVE PROTEIN: CRP: 9.5 mg/dL — ABNORMAL HIGH (ref <1.0)

## 2018-12-21 MED ORDER — POTASSIUM CHLORIDE CRYS ER 20 MEQ PO TBCR
40.0000 meq | EXTENDED_RELEASE_TABLET | Freq: Once | ORAL | Status: AC
  Administered 2018-12-21: 13:00:00 40 meq via ORAL
  Filled 2018-12-21: qty 2

## 2018-12-21 MED ORDER — INSULIN GLARGINE 100 UNIT/ML ~~LOC~~ SOLN
12.0000 [IU] | Freq: Every day | SUBCUTANEOUS | Status: DC
  Administered 2018-12-21 – 2018-12-23 (×3): 12 [IU] via SUBCUTANEOUS
  Filled 2018-12-21 (×4): qty 0.12

## 2018-12-21 MED ORDER — ORAL CARE MOUTH RINSE
15.0000 mL | Freq: Two times a day (BID) | OROMUCOSAL | Status: DC
  Administered 2018-12-21 – 2019-01-03 (×18): 15 mL via OROMUCOSAL

## 2018-12-21 MED ORDER — FUROSEMIDE 10 MG/ML IJ SOLN: 40.0000 mg | Freq: Two times a day (BID) | INTRAMUSCULAR | Status: DC

## 2018-12-21 MED ORDER — FUROSEMIDE 10 MG/ML IJ SOLN: 40.0000 mg | Freq: Every day | INTRAMUSCULAR | Status: DC

## 2018-12-21 NOTE — Progress Notes (Signed)
[facility] NAME: [name]    MR#:  [id number]  DATE OF BIRTH:  1934-09-05  SUBJECTIVE:  CHIEF COMPLAINT:   Chief Complaint  Patient presents with  . Shortness of Breath   -Patient seems confused.  Remains on heparin drip.  Off high flow nasal cannula and on 2 L oxygen this morning -For MRI of his right ankle today  REVIEW OF SYSTEMS:  Review of Systems  Unable to perform ROS: Mental status change    DRUG ALLERGIES:   Allergies  Allergen Reactions  . Contrast Media [Iodinated Diagnostic Agents] Shortness Of Breath    SOB  . Iohexol Shortness Of Breath     Desc: Respiratory Distress, Laryngedema, diaphoresis, Onset Date: [id number]   . Metrizamide Shortness Of Breath    SOB SOB SOB   . Latex Rash    VITALS:  Blood pressure (!) 101/42, pulse (!) 54, temperature 98.1 F (36.7 C), temperature source Oral, resp. rate 14, height _0  (1.702 m), weight 79.9 kg, SpO2 99 %.  PHYSICAL EXAMINATION:  Physical Exam  GENERAL:  84 y.o.-year-old patient lying in the bed with no acute distress. confused EYES: Pupils equal, round, reactive to light and accommodation. No scleral icterus. Extraocular muscles intact.  HEENT: Head atraumatic, normocephalic. Oropharynx and nasopharynx clear.  NECK:  Supple, no jugular venous distention. No thyroid enlargement, no tenderness.  LUNGS: Normal breath sounds bilaterally, bibasilar crackles, no wheezing, rhonchi or crepitation. No use of accessory muscles of respiration.   CARDIOVASCULAR: S1, S2 normal. No  rubs, or gallops. 3/6 systolic murmur present ABDOMEN: Soft, nontender, nondistended. Bowel sounds present. No organomegaly or mass.  EXTREMITIES: s/p left BKA and right foot swelling with a medial heel ulcer. Feeble DP pulses on right foot NEUROLOGIC: Cranial nerves II through XII are intact. Muscle strength equal in all extremities. Sensation intact. Gait not checked.   PSYCHIATRIC: The patient is alert and oriented x 1-2, appears very confused and sleepy.  SKIN: No obvious rash, lesion, or ulcer.       LABORATORY PANEL:   CBC Recent Labs  Lab 12/21/18 0054  WBC 22.8*  HGB 9.8*  HCT 29.0*  PLT 299   ------------------------------------------------------------------------------------------------------------------  Chemistries  Recent Labs  Lab 12/20/18 0602 12/21/18 0054  NA 134* 136  K 4.0 3.5  CL 99 99  CO2 23 29  GLUCOSE 521* 177*  BUN 28* 33*  CREATININE 1.33* 1.32*  CALCIUM 8.7* 8.2*  AST 23  --   ALT 22  --   ALKPHOS 168*  --   BILITOT 1.2  --    ------------------------------------------------------------------------------------------------------------------  Cardiac Enzymes No results for input(s): TROPONINI in the last 168 hours. ------------------------------------------------------------------------------------------------------------------  RADIOLOGY:  Dg Chest Portable 1 View  Result Date: 12/20/2018 CLINICAL DATA:  Shortness of breath. EXAM: PORTABLE CHEST 1 VIEW COMPARISON:  08/13/2018. FINDINGS: Cardiomegaly. Diffuse bilateral pulmonary infiltrates/edema. Small bilateral pleural effusions. No pneumothorax. No acute bony abnormality identified. IMPRESSION: Cardiomegaly with diffuse bilateral pulmonary infiltrates and edema. Small bilateral pleural effusions. CHF could present this fashion. Pneumonitis cannot be excluded. Electronically Signed   By: Marcello Moores  Register   On: 12/20/2018 07:01    EKG:   Orders placed or performed during the hospital encounter of 12/20/18  . EKG 12-Lead  . EKG 12-Lead  . ED EKG  . ED EKG    ASSESSMENT AND PLAN:   [name]  is a 84 y.o. male with a known history of  atrial fibrillation on Xarelto, CAD, ischemic cardiomyopathy with EF of 35 to 40%, hypertension, peripheral vascular disease status post left BKA and right leg angioplasty 2 days ago, obstructive sleep apnea on  CPAP presents to hospital secondary to confusion and worsening shortness of breath that started last night.  1.  Acute hypoxic respiratory failure-secondary to acute pulmonary edema. -Acute on chronic systolic heart failure.  Last cardiac cath was in January 2020 showing chronic occlusions and collaterals from LAD.  Medical management recommended.  EF was around 35%. -Received IV Lasix in ED, was on high flow nasal cannula yesterday. -Continue IV Lasix.  Follow-up chest x-ray.  Now on 2 L oxygen via nasal cannula.  Wean as tolerated.  Patient not on home oxygen  2.  Diabetic right heel ulcer-appears necrotic, ischemic ulcer.  Status post recent revascularization and arterial stents placement on 12/18/2018. -If MRSA is negative in the cultures, DC vancomycin.  Until then continue vancomycin and Zosyn for good anaerobic coverage.  Follow-up blood and urine cultures -Appreciate podiatry, ID and vascular consult.  Patient might need surgery for that. -WBC elevated on admission, initially thought to be secondary to a dose of steroid patient received on the 27.  WBC remains elevated.  Follow-up ESR and CRP -MRI of the ankle ordered  3.  Uncontrolled diabetes mellitus- A1c is 8.9, sugars were significantly elevated on admission.  Patient was placed on insulin drip.  Transition to subcutaneous insulin and sliding scale insulin   4.  A. fib-rate controlled.  Continue cardiac medications.  Patient on Cardizem, metoprolol and amiodarone -Xarelto on hold for possible procedure.  Started on heparin drip  5.  CKD stage III-stable.  Continue to monitor  6.  DVT prophylaxis-on heparin drip  7.  Confusion-secondary to acute metabolic encephalopathy from underlying infection and pain medications.   Will need PT consult after podiatry evaluation and procedures.  Wheelchair-bound with walker transfers at baseline     All the records are reviewed and case discussed with Care Management/Social  Workerr. Management plans discussed with the patient, family and they are in agreement.  CODE STATUS: Full Code  TOTAL TIME TAKING CARE OF THIS PATIENT: 38 minutes.   POSSIBLE D/C IN 2 DAYS, DEPENDING ON CLINICAL CONDITION.   [name] M.D on 12/21/2018 at 10:19 AM  Between 7am to 6pm - Pager - [phone]  After 6pm go to [email or web address] - password [email or web address] [facility]  Office  [phone]  CC: Primary care physician; Corey Skains, MD

## 2018-12-21 NOTE — Progress Notes (Signed)
ANTICOAGULATION CONSULT NOTE  Pharmacy Consult for Heparin Drip Management  Indication: atrial fibrillation  Allergies  Allergen Reactions  . Contrast Media [Iodinated Diagnostic Agents] Shortness Of Breath    SOB  . Iohexol Shortness Of Breath     Desc: Respiratory Distress, Laryngedema, diaphoresis, Onset Date: [id number]   . Metrizamide Shortness Of Breath    SOB SOB SOB   . Latex Rash    Patient Measurements: Height: 5\' 7"  (170.2 cm) Weight: 176 lb 2.4 oz (79.9 kg) IBW/kg (Calculated) : 66.1  Vital Signs: Temp: 97.8 F (36.6 C) (07/30 0200) Temp Source: Axillary (07/30 0200) BP: 101/42 (07/30 0600) Pulse Rate: 54 (07/30 0600)  Labs: Recent Labs    12/18/18 1022 12/20/18 0602 12/20/18 1700 12/21/18 0054 12/21/18 0645  HGB  --  11.6*  --  9.8*  --   HCT  --  35.4*  --  29.0*  --   PLT  --  367  --  299  --   APTT  --   --  28 41* 58*  CREATININE 1.28* 1.33*  --  1.32*  --   TROPONINIHS  --  234*  --   --   --     Estimated Creatinine Clearance: 44.4 mL/min (A) (by C-G formula based on SCr of 1.32 mg/dL (H)).   Medications:  Scheduled:  . amiodarone  200 mg Oral Daily  . aspirin EC  81 mg Oral Daily  . atorvastatin  80 mg Oral QPM  . Chlorhexidine Gluconate Cloth  6 each Topical Daily  . diltiazem  120 mg Oral Daily  . ferrous sulfate  325 mg Oral Q breakfast  . furosemide  40 mg Intravenous Q12H  . insulin aspart  0-15 Units Subcutaneous Q4H  . insulin glargine  12 Units Subcutaneous Daily  . mouth rinse  15 mL Mouth Rinse BID  . metoprolol tartrate  25 mg Oral TID   Infusions:  . sodium chloride Stopped (12/20/18 1335)  . dextrose 5 % and 0.45% NaCl Stopped (12/20/18 1134)  . heparin 1,200 Units/hr (12/21/18 0600)  . piperacillin-tazobactam (ZOSYN)  IV 3.375 g (12/21/18 0932)    Assessment: Patient takes rivaroxaban 15mg  with supper. Last dose 7/28 at 1700.   Goal of Therapy:  Heparin level 0.3-0.7 units/ml aPTT 66-102 seconds Monitor  platelets by anticoagulation protocol: Yes   Plan:  07/30 @ 0630 aPTT 58 seconds, subtherapeutic. Will increase rate to 1300 and will recheck aPTT @ 1300. Hgb had trended down will continue to monitor.  Pharmacy will continue to monitor and adjust per consult.    Tobie Lords, PharmD, BCPS Clinical Pharmacist 12/21/2018,7:17 AM

## 2018-12-21 NOTE — Progress Notes (Signed)
CRITICAL CARE PROGRESS NOTE    Name: Eligah Anello MRN: [id number] DOB: 1934/08/02     LOS: 1   HISTORY OF PRESENT ILLNESS    This is a pleasant 84 yo male with hx of HFpEF, essential HTN, PAD, AF, CVA, DM2, AF on xarelto hypothyroidism, prostate CA s/p radical prostatectomy, dyslipidemia, hx of RLE amputation due to critical limb ischemia admitted with altered mental status and confusion.     PAST MEDICAL HISTORY   Past Medical History:  Diagnosis Date  . Arthritis   . Atrial fibrillation ([place])   . Carcinoma of prostate ([place])   . CHF (congestive heart failure) ([place])   . Chronic kidney disease   . Diabetes mellitus without complication ([place])   . ED (erectile dysfunction)   . Frequent urination   . Hyperlipidemia   . Hypertension   . Moderate mitral insufficiency   . Peripheral vascular disease ([place])   . Pneumonia 04/2016  . Prostate cancer ([place])   . Sleep apnea    OSA--USE C-PAP  . Ulcer of left foot due to type 2 diabetes mellitus ([place])   . Urinary stress incontinence, male      SURGICAL HISTORY   Past Surgical History:  Procedure Laterality Date  . AMPUTATION Left 01/12/2017   Procedure: AMPUTATION BELOW KNEE;  Surgeon: Algernon Huxley, MD;  Location: [facility];  Service: General;  Laterality: Left;  . APLIGRAFT PLACEMENT Left 10/15/2016   Procedure: APLIGRAFT PLACEMENT;  Surgeon: Albertine Patricia, DPM;  Location: [facility];  Service: Podiatry;  Laterality: Left;  necrotic ulcer  . CHOLECYSTECTOMY    . EYE SURGERY Bilateral    Cataract Extraction with IOL  . HIP ARTHROPLASTY Right 10/13/2018   Procedure: ARTHROPLASTY BIPOLAR HIP (HEMIARTHROPLASTY);  Surgeon: Earnestine Leys, MD;  Location: [facility];  Service: Orthopedics;  Laterality: Right;  . IRRIGATION AND DEBRIDEMENT FOOT Left  10/15/2016   Procedure: IRRIGATION AND DEBRIDEMENT FOOT-EXCISIONAL DEBRIDEMENT OF SKIN 3RD, 4TH AND 5TH TOES WITH APPLICATION OF APLIGRAFT;  Surgeon: Albertine Patricia, DPM;  Location: [facility];  Service: Podiatry;  Laterality: Left;  necrotic,gangrene  . IRRIGATION AND DEBRIDEMENT FOOT Left 12/09/2016   Procedure: IRRIGATION AND DEBRIDEMENT FOOT-MUSCLE/FASCIA, RESECTION OF FOURTH AND FIFTH METATARSAL NECROTIC BONE;  Surgeon: Albertine Patricia, DPM;  Location: [facility];  Service: Podiatry;  Laterality: Left;  . LEFT HEART CATH AND CORONARY ANGIOGRAPHY N/A 06/05/2018   Procedure: LEFT HEART CATH AND CORONARY ANGIOGRAPHY;  Surgeon: Yolonda Kida, MD;  Location: [place] [facility];  Service: Cardiovascular;  Laterality: N/A;  . LOWER EXTREMITY ANGIOGRAPHY Left 08/16/2016   Procedure: Lower Extremity Angiography;  Surgeon: Algernon Huxley, MD;  Location: [facility];  Service: Cardiovascular;  Laterality: Left;  . LOWER EXTREMITY ANGIOGRAPHY Left 09/01/2016   Procedure: Lower Extremity Angiography;  Surgeon: Algernon Huxley, MD;  Location: [facility];  Service: Cardiovascular;  Laterality: Left;  . LOWER EXTREMITY ANGIOGRAPHY Left 10/14/2016   Procedure: Lower Extremity Angiography;  Surgeon: Algernon Huxley, MD;  Location: [facility];  Service: Cardiovascular;  Laterality: Left;  . LOWER EXTREMITY ANGIOGRAPHY Left 12/20/2016   Procedure: Lower Extremity Angiography;  Surgeon: Algernon Huxley, MD;  Location: [place] [facility];  Service: Cardiovascular;  Laterality: Left;  . LOWER EXTREMITY ANGIOGRAPHY Right 12/18/2018   Procedure: LOWER EXTREMITY ANGIOGRAPHY;  Surgeon: Algernon Huxley, MD;  Location: [place];  Service: Cardiovascular;  Laterality: Right;  . LOWER EXTREMITY INTERVENTION  09/01/2016  Procedure: Lower Extremity Intervention;  Surgeon: Algernon Huxley, MD;  Location: [facility];  Service: Cardiovascular;;  . PROSTATE SURGERY     removal  . TONSILLECTOMY      as a child     FAMILY HISTORY   Family History  Problem Relation Age of Onset  . Hypertension Mother   . Diabetes Mother   . Prostate cancer Neg Hx   . Bladder Cancer Neg Hx   . Kidney disease Neg Hx      SOCIAL HISTORY   Social History   Tobacco Use  . Smoking status: Former Smoker    Packs/day: 0.25    Types: Cigarettes    Quit date: 10/14/1994    Years since quitting: 24.2  . Smokeless tobacco: Never Used  Substance Use Topics  . Alcohol use: No    Alcohol/week: 0.0 standard drinks  . Drug use: No     MEDICATIONS   Current Medication:  Current Facility-Administered Medications:  .  acetaminophen (TYLENOL) tablet 650 mg, 650 mg, Oral, Q6H PRN **OR** acetaminophen (TYLENOL) suppository 650 mg, 650 mg, Rectal, Q6H PRN, [name], MD .  amiodarone (PACERONE) tablet 200 mg, 200 mg, Oral, Daily, Tressia Miners, Radhika, MD, 200 mg at 12/20/18 1128 .  aspirin EC tablet 81 mg, 81 mg, Oral, Daily, Kalisetti, Radhika, MD, 81 mg at 12/20/18 1126 .  atorvastatin (LIPITOR) tablet 80 mg, 80 mg, Oral, QPM, [name], MD, 80 mg at 12/20/18 1654 .  Chlorhexidine Gluconate Cloth 2 % PADS 6 each, 6 each, Topical, Daily, Maleki Hippe, MD .  dextrose 50 % solution 25 mL, 25 mL, Intravenous, PRN, Lanney Gins, Jamesia Linnen, MD .  diltiazem (CARDIZEM CD) 24 hr capsule 120 mg, 120 mg, Oral, Daily, Tressia Miners, Radhika, MD, 120 mg at 12/20/18 1128 .  ferrous sulfate tablet 325 mg, 325 mg, Oral, Q breakfast, [name], MD .  Derrill Memo ON 12/22/2018] furosemide (LASIX) injection 40 mg, 40 mg, Intravenous, Daily, Ayline Dingus, MD .  heparin ADULT infusion 100 units/mL (25000 units/240mL sodium chloride 0.45%), 1,300 Units/hr, Intravenous, Continuous, Kalisetti, Radhika, MD, Last Rate: 13 mL/hr at 12/21/18 0756, 1,300 Units/hr at 12/21/18 0756 .  HYDROcodone-acetaminophen (NORCO/VICODIN) 5-325 MG per tablet 1-2 tablet, 1-2 tablet, Oral, Q6H PRN, [name], MD, 2 tablet at  12/21/18 0120 .  insulin aspart (novoLOG) injection 0-15 Units, 0-15 Units, Subcutaneous, Q4H, Darel Hong D, NP, 3 Units at 12/21/18 0800 .  insulin glargine (LANTUS) injection 12 Units, 12 Units, Subcutaneous, QHS, Charlett Nose, RPH .  MEDLINE mouth rinse, 15 mL, Mouth Rinse, BID, Darel Hong D, NP, 15 mL at 12/21/18 0100 .  methocarbamol (ROBAXIN) tablet 500 mg, 500 mg, Oral, Q6H PRN, [name], MD .  metoprolol tartrate (LOPRESSOR) tablet 25 mg, 25 mg, Oral, TID, [name], MD, 25 mg at 12/20/18 1654 .  morphine 2 MG/ML injection 1-2 mg, 1-2 mg, Intravenous, Q4H PRN, Ottie Glazier, MD, 2 mg at 12/21/18 0342 .  nitroGLYCERIN (NITROSTAT) SL tablet 0.4 mg, 0.4 mg, Sublingual, Q5 min PRN, [name], MD .  ondansetron (ZOFRAN) tablet 4 mg, 4 mg, Oral, Q6H PRN **OR** ondansetron (ZOFRAN) injection 4 mg, 4 mg, Intravenous, Q6H PRN, Tressia Miners, Radhika, MD .  piperacillin-tazobactam (ZOSYN) IVPB 3.375 g, 3.375 g, Intravenous, Q8H, Kalisetti, Radhika, MD, Last Rate: 12.5 mL/hr at 12/21/18 0647, 3.375 g at 12/21/18 0647 .  potassium chloride SA (K-DUR) CR tablet 40 mEq, 40 mEq, Oral, Once, Charlett Nose, [facility]    ALLERGIES   Contrast  media [iodinated diagnostic agents], Iohexol, Metrizamide, and Latex    REVIEW OF SYSTEMS   10 Point ROS done and is negative except for severe LLE pain due to gangrenous chronic ulceration of heel.   PHYSICAL EXAMINATION   Vital Signs: Temp:  [97.8 F (36.6 C)-98.6 F (37 C)] 98.1 F (36.7 C) (07/30 0801) Pulse Rate:  [53-99] 54 (07/30 0600) Resp:  [14-29] 14 (07/30 0600) BP: (98-153)/(42-99) 101/42 (07/30 0600) SpO2:  [88 %-100 %] 99 % (07/30 0600)  GENERAL:mild distress due to LLE pain HEAD: Normocephalic, atraumatic.  EYES: Pupils equal, round, reactive to light.  No scleral icterus.  MOUTH: Moist mucosal membrane. NECK: Supple. No thyromegaly. No nodules. No JVD.  PULMONARY: decreased breath sounds  bilaterally  CARDIOVASCULAR: S1 and S2. Regular rate and rhythm. No murmurs, rubs, or gallops.  GASTROINTESTINAL: Soft, nontender, non-distended. No masses. Positive bowel sounds. No hepatosplenomegaly.  MUSCULOSKELETAL: No swelling, clubbing, or edema.  NEUROLOGIC: Mild distress due to acute illness SKIN:intact,warm,dry   PERTINENT DATA     Lines / Drains: PIV  Cultures / Sepsis markers: Wound and blood culture pending   Antibiotics: PPX keflex    Protocols / Consultants: Vascular surgery and ID in process  Tests / Events: TTE   Overnight: No significant overnight events.     Infusions: . heparin 1,300 Units/hr (12/21/18 0756)  . piperacillin-tazobactam (ZOSYN)  IV 3.375 g (12/21/18 8099)   Scheduled Medications: . amiodarone  200 mg Oral Daily  . aspirin EC  81 mg Oral Daily  . atorvastatin  80 mg Oral QPM  . Chlorhexidine Gluconate Cloth  6 each Topical Daily  . diltiazem  120 mg Oral Daily  . ferrous sulfate  325 mg Oral Q breakfast  . [START ON 12/22/2018] furosemide  40 mg Intravenous Daily  . insulin aspart  0-15 Units Subcutaneous Q4H  . insulin glargine  12 Units Subcutaneous QHS  . mouth rinse  15 mL Mouth Rinse BID  . metoprolol tartrate  25 mg Oral TID  . potassium chloride  40 mEq Oral Once   PRN Medications: acetaminophen **OR** acetaminophen, dextrose, HYDROcodone-acetaminophen, methocarbamol, morphine injection, nitroGLYCERIN, ondansetron **OR** ondansetron (ZOFRAN) IV Hemodynamic parameters:   Intake/Output: 07/29 0701 - 07/30 0700 In: 238.1 [I.V.:183.4; IV Piggyback:54.7] Out: 1150 [Urine:1150]  Ventilator  Settings:      LAB RESULTS:  Basic Metabolic Panel: Recent Labs  Lab 12/18/18 1022 12/20/18 0602 12/21/18 0054  NA 135 134* 136  K 4.1 4.0 3.5  CL 103 99 99  CO2 21* 23 29  GLUCOSE 323* 521* 177*  BUN 18 28* 33*  CREATININE 1.28* 1.33* 1.32*  CALCIUM 8.5* 8.7* 8.2*   Liver Function Tests: Recent Labs  Lab  12/20/18 0602  AST 23  ALT 22  ALKPHOS 168*  BILITOT 1.2  PROT 7.8  ALBUMIN 3.1*   No results for input(s): LIPASE, AMYLASE in the last 168 hours. No results for input(s): AMMONIA in the last 168 hours. CBC: Recent Labs  Lab 12/20/18 0602 12/21/18 0054  WBC 28.3* 22.8*  NEUTROABS 25.8*  --   HGB 11.6* 9.8*  HCT 35.4* 29.0*  MCV 88.5 86.3  PLT 367 299   Cardiac Enzymes: No results for input(s): CKTOTAL, CKMB, CKMBINDEX, TROPONINI in the last 168 hours. BNP: Invalid input(s): POCBNP CBG: Recent Labs  Lab 12/21/18 0023 12/21/18 0124 12/21/18 0227 12/21/18 0347 12/21/18 0755  GLUCAP 178* 167* 145* 133* 155*     IMAGING RESULTS:  Imaging: Dg Chest Port 1 [address]  Result Date: 12/21/2018 CLINICAL DATA:  CHF EXAM: PORTABLE CHEST 1 VIEW COMPARISON:  12/20/2018 FINDINGS: Mild improvement in interstitial coarsening. Stable cardiomegaly distorted by rightward rotation. Volume loss and streaky density at the right base. No effusion or pneumothorax. IMPRESSION: 1. Improved pulmonary edema. 2. Atelectasis at the right base. Electronically Signed   By: Monte Fantasia M.D.   On: 12/21/2018 10:34   Dg Chest Portable 1 View  Result Date: 12/20/2018 CLINICAL DATA:  Shortness of breath. EXAM: PORTABLE CHEST 1 VIEW COMPARISON:  08/13/2018. FINDINGS: Cardiomegaly. Diffuse bilateral pulmonary infiltrates/edema. Small bilateral pleural effusions. No pneumothorax. No acute bony abnormality identified. IMPRESSION: Cardiomegaly with diffuse bilateral pulmonary infiltrates and edema. Small bilateral pleural effusions. CHF could present this fashion. Pneumonitis cannot be excluded. Electronically Signed   By: Marcello Moores  Register   On: 12/20/2018 07:01     LEFT HEART CATH - 06/05/2018  Dist LM to Ost LAD lesion is 100% stenosed.  Ost RCA to Prox RCA lesion is 75% stenosed.  Prox Cx lesion is 50% stenosed.   Conclusion Inpatient diagnostic cardiac catheter right radial approach Vessel  coronary disease Apical ostial left anterior descending was totally occluded faint collaterals from right to left Circumflex was large with moderate disease in the proximal segment RCA was large with ostial calcification atheroma ejection fraction around 75% TIMI-3 flow Left ventriculogram was performed manually with the right coronary catheter which showed anterior apical akinesis ejection fraction was estimated around 35% Intervention was deferred in favor of aggressive medical therapy     TRANSTHORACIC ECHOCARDIOGRAM -Left ventricle: Systolic function was mildly reduced. The   estimated ejection fraction was in the range of 45% to 50%.   Hypokinesis of the anteroseptal myocardium. Hypokinesis of the   apical myocardium. - Left atrium: The atrium was mildly dilated.   ASSESSMENT AND PLAN    -Multidisciplinary rounds held today  Acute Hypoxic Respiratory Failure  -due to interstitial edema likely due to cardiac etiology - continue supportive care with O therapy while treating underlying cause -Wean Fio2  -decreased lasix to once daily 40mg  due to mild prerenal azotemia    Acute decompensated sytolic CHF with EF  92-11% - patient had CP at home  - will repeat TTE to evaluate interval changes - hx of renal impairment based on medical record - IV lasix 40 po daily  -oxygen as needed -Lasix as tolerated -follow up cardiac enzymes as indicated ICU monitoring    Renal Failure-most likely cardiorenal in etiology -follow chem 7 -follow UO -continue Foley Catheter-assess need daily    Hx hemmoragic CVA   - patient received tPA post ischemic CVA in jan 2020   Severe hyperglycemia  -likely due to high dose steroids IV recently on background of DM - patient will need short term insulin drip, discussed with pharmacy   GI/Nutrition GI PROPHYLAXIS as indicated DIET-->TF's as tolerated Constipation protocol as indicated  ENDO - ICU hypoglycemic\Hyperglycemia  protocol -check FSBS per protocol   ELECTROLYTES -follow labs as needed -replace as needed -pharmacy consultation   DVT/GI PRX ordered -SCDs  TRANSFUSIONS AS NEEDED MONITOR FSBS ASSESS the need for LABS as needed   Critical care provider statement:    Critical care time (minutes):  33   Critical care time was exclusive of:  Separately billable procedures and treating other patients   Critical care was necessary to treat or prevent imminent or life-threatening deterioration of the following conditions:  acute decompensated systolic CHF, RLE necrotic heel wound, severe hyperglycemia   Critical  care was time spent personally by me on the following activities:  Development of treatment plan with patient or surrogate, discussions with consultants, evaluation of patient's response to treatment, examination of patient, obtaining history from patient or surrogate, ordering and performing treatments and interventions, ordering and review of laboratory studies and re-evaluation of patient's condition.  I assumed direction of critical care for this patient from another provider in my specialty: no    This document was prepared using Dragon voice recognition software and may include unintentional dictation errors.    Ottie Glazier, M.D.  Division of [place]

## 2018-12-21 NOTE — Progress Notes (Signed)
Pharmacy Electrolyte Monitoring Consult:  Pharmacy consulted to assist in monitoring and replacing electrolytes in this 84 y.o. male admitted on 12/20/2018.  Labs:  Sodium (mmol/L)  Date Value  12/21/2018 136  01/20/2013 140   Potassium (mmol/L)  Date Value  12/21/2018 3.5  01/20/2013 3.5   Magnesium (mg/dL)  Date Value  12/21/2018 1.9   Phosphorus (mg/dL)  Date Value  12/21/2018 3.3   Calcium (mg/dL)  Date Value  12/21/2018 8.2 (L)   Calcium, Total (mg/dL)  Date Value  01/20/2013 9.4   Albumin (g/dL)  Date Value  12/20/2018 3.1 (L)  01/19/2013 3.7    Assessment/Plan: Electrolytes: Furosemide decreased to 40mg  IV Daily during am ICU rounds. Potassium chloride 56mEq PO x 1. Magnesium oxide 400mg  PO BID.   Will replace for goal potassium ~ 4 and goal magnesium ~ 2.   Glucose: Patient transitioned off insulin drip. Continued Lantus 12 units Qhs. Continue Q4hr SSI.   Pharmacy will continue to monitor and adjust per consult.   Simpson,Michael L 12/21/2018 3:58 PM

## 2018-12-21 NOTE — Progress Notes (Signed)
MRI reviewed today.  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  Also area of concern for abscess deep to the Achilles tendon region.  Necrotic tissue to the area.  Discussed with vascular surgery and revascularization has been performed recently.  Patient could undergo surgical debridement of the necrotic tissue and I&D of the deep abscessed area.  Would likely have to take down the Achilles tendon and begin wound VAC in.  He did undergo longstanding wound VAC or skin grafting for attempted salvage.  We will also have to remove the infected calcaneus as well.  He has a very low likelihood of healing this larger wound with amount of necrotic tissue.  Once patient is medically stable can consider surgical invention at that time.  May need to discuss with family other option of either below the knee or above the knee amputation as curative and quicker recovery.

## 2018-12-21 NOTE — Progress Notes (Signed)
ANTICOAGULATION CONSULT NOTE  Pharmacy Consult for Heparin Drip Management  Indication: atrial fibrillation  Allergies  Allergen Reactions  . Contrast Media [Iodinated Diagnostic Agents] Shortness Of Breath    SOB  . Iohexol Shortness Of Breath     Desc: Respiratory Distress, Laryngedema, diaphoresis, Onset Date: [id number]   . Metrizamide Shortness Of Breath    SOB SOB SOB   . Latex Rash    Patient Measurements: Height: 5\' 7"  (170.2 cm) Weight: 176 lb 2.4 oz (79.9 kg) IBW/kg (Calculated) : 66.1  Vital Signs: Temp: 98.1 F (36.7 C) (07/30 0801) Temp Source: Oral (07/30 0801) BP: 101/42 (07/30 0600) Pulse Rate: 54 (07/30 0600)  Labs: Recent Labs    12/20/18 0602  12/21/18 0054 12/21/18 0645 12/21/18 1257  HGB 11.6*  --  9.8*  --   --   HCT 35.4*  --  29.0*  --   --   PLT 367  --  299  --   --   APTT  --    < > 41* 58* 37*  CREATININE 1.33*  --  1.32*  --   --   TROPONINIHS 234*  --   --   --   --    < > = values in this interval not displayed.    Estimated Creatinine Clearance: 44.4 mL/min (A) (by C-G formula based on SCr of 1.32 mg/dL (H)).   Medications:  Scheduled:  . amiodarone  200 mg Oral Daily  . aspirin EC  81 mg Oral Daily  . atorvastatin  80 mg Oral QPM  . Chlorhexidine Gluconate Cloth  6 each Topical Daily  . diltiazem  120 mg Oral Daily  . ferrous sulfate  325 mg Oral Q breakfast  . [START ON 12/22/2018] furosemide  40 mg Intravenous Daily  . insulin aspart  0-15 Units Subcutaneous Q4H  . insulin glargine  12 Units Subcutaneous QHS  . mouth rinse  15 mL Mouth Rinse BID  . metoprolol tartrate  25 mg Oral TID   Infusions:  . heparin 1,300 Units/hr (12/21/18 0756)  . piperacillin-tazobactam (ZOSYN)  IV 3.375 g (12/21/18 8756)    Assessment: Pharmacy consulted for heparin drip management for 84 yo male admitted on 7/29 with right heel ulcer and confusion. Patient with angioplasty on 7/27. Patient takes rivaroxaban 15mg  with supper. Last dose  7/28 at 1700. Patient with history significant for atrial fibrillation, CHF, CKD, diabetes, hypertension, hyperlipidemia, left BKA.   Goal of Therapy:  Heparin level 0.3-0.7 units/ml aPTT 66-102 seconds Monitor platelets by anticoagulation protocol: Yes   Plan:  Heparin currently infusing at 1300 units/hr. Patient went to MRI and infusion was held ~ 30 minutes prior to level being obtained. Will continue heparin at current rate and obtain aPTT at 1600.    Will continue to monitor anti-Xa levels daily until aPTTs and anti-Xa levels correlate.   Pharmacy will continue to monitor and adjust per consult.    Simpson,Michael L 12/21/2018,1:37 PM

## 2018-12-21 NOTE — Progress Notes (Signed)
ANTICOAGULATION CONSULT NOTE  Pharmacy Consult for Heparin Drip Management  Indication: atrial fibrillation  Allergies  Allergen Reactions  . Contrast Media [Iodinated Diagnostic Agents] Shortness Of Breath    SOB  . Iohexol Shortness Of Breath     Desc: Respiratory Distress, Laryngedema, diaphoresis, Onset Date: [id number]   . Metrizamide Shortness Of Breath    SOB SOB SOB   . Latex Rash    Patient Measurements: Height: 5\' 7"  (170.2 cm) Weight: 176 lb 2.4 oz (79.9 kg) IBW/kg (Calculated) : 66.1  Vital Signs: Temp: 97.8 F (36.6 C) (07/30 0200) Temp Source: Axillary (07/30 0200) BP: 106/60 (07/30 0200) Pulse Rate: 57 (07/30 0100)  Labs: Recent Labs    12/18/18 1022 12/20/18 0602 12/20/18 1700 12/21/18 0054  HGB  --  11.6*  --  9.8*  HCT  --  35.4*  --  29.0*  PLT  --  367  --  299  APTT  --   --  28 41*  CREATININE 1.28* 1.33*  --  1.32*  TROPONINIHS  --  234*  --   --     Estimated Creatinine Clearance: 44.4 mL/min (A) (by C-G formula based on SCr of 1.32 mg/dL (H)).   Medications:  Scheduled:  . amiodarone  200 mg Oral Daily  . aspirin EC  81 mg Oral Daily  . atorvastatin  80 mg Oral QPM  . Chlorhexidine Gluconate Cloth  6 each Topical Daily  . diltiazem  120 mg Oral Daily  . ferrous sulfate  325 mg Oral Q breakfast  . furosemide  40 mg Intravenous Q12H  . insulin aspart  0-15 Units Subcutaneous Q4H  . insulin glargine  12 Units Subcutaneous Daily  . mouth rinse  15 mL Mouth Rinse BID  . metoprolol tartrate  25 mg Oral TID   Infusions:  . sodium chloride Stopped (12/20/18 1335)  . dextrose 5 % and 0.45% NaCl Stopped (12/20/18 1134)  . heparin 1,200 Units/hr (12/21/18 0200)  . piperacillin-tazobactam (ZOSYN)  IV 12.5 mL/hr at 12/21/18 0200    Assessment: Patient takes rivaroxaban 15mg  with supper. Last dose 7/28 at 1700.   Goal of Therapy:  Heparin level 0.3-0.7 units/ml aPTT 66-102 seconds Monitor platelets by anticoagulation protocol:  Yes   Plan:  07/30 @ 0100 aPTT 41 seconds, subtherapeutic; however, per RN patient had pulled out his line on 07/29 @ 1830 and another line was not placed until 2130 that same night; therefore drip was not running for roughly 3 hours and aPTT was drawn around 0100 which is only 3.5hrs between start of drip and level drawn. Will continue current rate of 1200 units/hr and will recheck aPTT @ 0700, hgb trended down 11.6 >> 9.8 patient s/p RLE angiogram POD1, will continue to monitor.  Pharmacy will continue to monitor and adjust per consult.    Tobie Lords, PharmD, BCPS Clinical Pharmacist 12/21/2018,2:36 AM

## 2018-12-21 NOTE — Progress Notes (Signed)
Inpatient Diabetes Program Recommendations  AACE/ADA: New Consensus Statement on Inpatient Glycemic Control (2015)  Target Ranges:  Prepandial:   less than 140 mg/dL      Peak postprandial:   less than 180 mg/dL (1-2 hours)      Critically ill patients:  140 - 180 mg/dL   Results for AMAAN, MEYER (MRN [id number]) as of 12/21/2018 07:40  Ref. Range 12/20/2018 23:14 12/21/2018 00:23 12/21/2018 01:24 12/21/2018 02:27 12/21/2018 03:47  Glucose-Capillary Latest Ref Range: 70 - 99 mg/dL 139 (H) 178 (H)  Lantus 12 units @00 :56 167 (H) 145 (H) 133 (H)  Novolog 2 units  Results for MARL, SEAGO (MRN [id number]) as of 12/21/2018 07:40  Ref. Range 12/20/2018 06:02  Glucose Latest Ref Range: 70 - 99 mg/dL [facility])  Results for AYO, SMOAK (MRN [id number]) as of 12/21/2018 07:40  Ref. Range 06/05/2018 05:54 12/20/2018 11:36  Hemoglobin A1C Latest Ref Range: 4.8 - 5.6 % 10.7 (H) 8.9 (H)   Review of Glycemic Control  Diabetes history:DM2 Outpatient Diabetes medications:Lantus 15 units QHS, Humalog 3 units with meals plus 1 unit for every 50 mg/dl above target glucose of 200 mg/dl Current orders for Inpatient glycemic control: Lantus 12 units daily, Novolog 0-15 units Q4H  Recommendations:  IV Fluids: Noted patient has D5 1/2 NS at 50 ml/hr ordered. Please re-evaluate if dextrose is still needed in IV fluids.  NOTE: Noted consult for glycemic control. Initial glucose 521 mg/dl on 12/20/18. In reviewing chart, noted patient received Solumedrol 125 mg by EMS in route to hospital. Patient was on an insulin drip which has been transitioned to SQ insulin. Agree with current orders for glycemic control.  Will continue to follow.  Thanks, Barnie Alderman, RN, MSN, CDE Diabetes Coordinator Inpatient Diabetes Program [phone] (Team Pager from 8am to 5pm)

## 2018-12-21 NOTE — Progress Notes (Signed)
Updated wife over the phone this afternoon- discussed in detail about MRI findings and possibility of surgery, ABX and also current confusion, all questions answered to her satisfaction

## 2018-12-21 NOTE — Progress Notes (Signed)
ID Status: No new complaints.  Patient Vitals for the past 24 hrs:  BP Temp Temp src Pulse Resp SpO2  12/21/18 0801 - 98.1 F (36.7 C) Oral - - -  12/21/18 0600 (!) 101/42 - - (!) 54 14 99 %  12/21/18 0500 (!) 102/51 - - (!) 53 14 97 %  12/21/18 0400 (!) 105/47 - - (!) 56 14 97 %  12/21/18 0300 98/82 - - (!) 56 19 97 %  12/21/18 0200 106/60 97.8 F (36.6 C) Axillary (!) 55 18 97 %  12/21/18 0100 (!) 125/42 - - (!) 57 16 93 %  12/21/18 0000 (!) 116/48 - - (!) 57 18 98 %  12/20/18 2300 (!) 150/99 - - 62 (!) 22 100 %  12/20/18 2205 (!) 119/51 - - (!) 59 - -  12/20/18 2200 (!) 119/51 - - 60 15 98 %  12/20/18 2100 - - - 65 - 93 %  12/20/18 2000 (!) 110/55 98.6 F (37 C) Oral 60 - (!) 88 %  12/20/18 1700 137/70 - - 67 16 94 %    Right foot heel necrotic ulcer persist  CBC Latest Ref Rng & Units 12/21/2018 12/20/2018 10/16/2018  WBC 4.0 - 10.5 K/uL 22.8(H) 28.3(H) 12.1(H)  Hemoglobin 13.0 - 17.0 g/dL 9.8(L) 11.6(L) 11.8(L)  Hematocrit 39.0 - 52.0 % 29.0(L) 35.4(L) 36.2(L)  Platelets 150 - 400 K/uL 299 367 185    CMP Latest Ref Rng & Units 12/21/2018 12/20/2018 12/18/2018  Glucose 70 - 99 mg/dL 177(H) 521(HH) 323(H)  BUN 8 - 23 mg/dL 33(H) 28(H) 18  Creatinine 0.61 - 1.24 mg/dL 1.32(H) 1.33(H) 1.28(H)  Sodium 135 - 145 mmol/L 136 134(L) 135  Potassium 3.5 - 5.1 mmol/L 3.5 4.0 4.1  Chloride 98 - 111 mmol/L 99 99 103  CO2 22 - 32 mmol/L 29 23 21(L)  Calcium 8.9 - 10.3 mg/dL 8.2(L) 8.7(L) 8.5(L)  Total Protein 6.5 - 8.1 g/dL - 7.8 -  Total Bilirubin 0.3 - 1.2 mg/dL - 1.2 -  Alkaline Phos 38 - 126 U/L - 168(H) -  AST 15 - 41 U/L - 23 -  ALT 0 - 44 U/L - 96 -    84 year old male with history of CHF, A. fib, peripheral artery disease, right heel ischemic ulcer, recent angiogram and angioplasty and stent placement to the right leg presents with shortness of breath and chest pain.  Acute pulmonary edema secondary to congestive heart failure: On furosemide.  Management as per primary  team  Chronic necrotic/ischemic right heel ulcer on  Zosyn.  Cultures have already been sent. Seen by Dr. Vickki Muff.  MRI of the foot shows large open wound involving the posterior aspect of the heel right down to the Achilles tendon.  Significant septic Achilles tendinopathy is seen.  Pre-ankle is complex fluid collection containing gas consistent with an abscess measuring approximately 4.23 cm.  And osteomyelitis involving the posterior aspect of the calcaneum.  He is contemplating between local surgery versus BKA. Continue Zosyn so far wound culture is showing Pseudomonas..  peripheral arterial diease s/p angioplasty rt leg   A. fib on amiodarone, diltiazem   Leukocytosis: Initially thought to be due to steroids he received on 7/27 but very likely the infected wound is contributing to this.   _Left BKA.    Discussed the management with the ICU NP

## 2018-12-21 NOTE — Progress Notes (Signed)
ANTICOAGULATION CONSULT NOTE  Pharmacy Consult for Heparin Drip Management  Indication: atrial fibrillation  Patient Measurements: Height: 5\' 7"  (170.2 cm) Weight: 176 lb 2.4 oz (79.9 kg) IBW/kg (Calculated) : 66.1  Vital Signs: Temp: 98.1 F (36.7 C) (07/30 0801) Temp Source: Oral (07/30 0801) BP: 125/45 (07/30 1647) Pulse Rate: 63 (07/30 1647)  Labs: Recent Labs    12/20/18 0602  12/21/18 0054 12/21/18 0645 12/21/18 1257 12/21/18 1409  HGB 11.6*  --  9.8*  --   --   --   HCT 35.4*  --  29.0*  --   --   --   PLT 367  --  299  --   --   --   APTT  --    < > 41* 58* 37* 46*  CREATININE 1.33*  --  1.32*  --   --   --   TROPONINIHS 234*  --   --   --   --   --    < > = values in this interval not displayed.    Estimated Creatinine Clearance: 44.4 mL/min (A) (by C-G formula based on SCr of 1.32 mg/dL (H)).   Medications:  Scheduled:  . amiodarone  200 mg Oral Daily  . aspirin EC  81 mg Oral Daily  . atorvastatin  80 mg Oral QPM  . Chlorhexidine Gluconate Cloth  6 each Topical Daily  . diltiazem  120 mg Oral Daily  . ferrous sulfate  325 mg Oral Q breakfast  . [START ON 12/22/2018] furosemide  40 mg Intravenous Daily  . insulin aspart  0-15 Units Subcutaneous Q4H  . insulin glargine  12 Units Subcutaneous QHS  . mouth rinse  15 mL Mouth Rinse BID  . metoprolol tartrate  25 mg Oral TID   Infusions:  . heparin 1,300 Units/hr (12/21/18 0756)  . piperacillin-tazobactam (ZOSYN)  IV 3.375 g (12/21/18 1634)    Assessment: Pharmacy consulted for heparin drip management for 84 yo male admitted on 7/29 with right heel ulcer and confusion. Patient with angioplasty on 7/27. Patient takes rivaroxaban 15mg  with supper. Last dose 7/28 at 1700. Patient with history significant for atrial fibrillation, CHF, CKD, diabetes, hypertension, hyperlipidemia, left BKA.   Goal of Therapy:  Heparin level 0.3-0.7 units/ml aPTT 66-102 seconds Monitor platelets by anticoagulation protocol:  Yes  Heparin Course: 7/29 PM initiation: drip initated at 1200 units/hr (no bolus) 7/30 0100 aPTT 41s: 1200 units/h 7/30 0630 aPTT 58s: inc rate to 1300 units/hr 7/30 1721 aPTT 52s: inc rate to 1400 units/hr   Plan:   increase heparin infusion to 1400 units/hr  Check aPTT in 8 hours after rate change has been made  Will continue to monitor anti-Xa levels daily until aPTTs and anti-Xa levels correlate.   H&H trending down, PLT anl: CBC in am  Pharmacy will continue to monitor and adjust per consult.    Dallie Piles, PharmD 12/21/2018,4:52 PM

## 2018-12-22 ENCOUNTER — Other Ambulatory Visit: Payer: Self-pay

## 2018-12-22 DIAGNOSIS — Z79899 Other long term (current) drug therapy: Secondary | ICD-10-CM

## 2018-12-22 DIAGNOSIS — R7881 Bacteremia: Secondary | ICD-10-CM

## 2018-12-22 DIAGNOSIS — E1169 Type 2 diabetes mellitus with other specified complication: Secondary | ICD-10-CM

## 2018-12-22 DIAGNOSIS — B964 Proteus (mirabilis) (morganii) as the cause of diseases classified elsewhere: Secondary | ICD-10-CM

## 2018-12-22 LAB — CBC
HCT: 28.8 % — ABNORMAL LOW (ref 39.0–52.0)
Hemoglobin: 9.3 g/dL — ABNORMAL LOW (ref 13.0–17.0)
MCH: 28.7 pg (ref 26.0–34.0)
MCHC: 32.3 g/dL (ref 30.0–36.0)
MCV: 88.9 fL (ref 80.0–100.0)
Platelets: 277 10*3/uL (ref 150–400)
RBC: 3.24 MIL/uL — ABNORMAL LOW (ref 4.22–5.81)
RDW: 14.4 % (ref 11.5–15.5)
WBC: 15.6 10*3/uL — ABNORMAL HIGH (ref 4.0–10.5)
nRBC: 0 % (ref 0.0–0.2)

## 2018-12-22 LAB — GLUCOSE, CAPILLARY
Glucose-Capillary: 108 mg/dL — ABNORMAL HIGH (ref 70–99)
Glucose-Capillary: 126 mg/dL — ABNORMAL HIGH (ref 70–99)
Glucose-Capillary: 129 mg/dL — ABNORMAL HIGH (ref 70–99)
Glucose-Capillary: 131 mg/dL — ABNORMAL HIGH (ref 70–99)
Glucose-Capillary: 132 mg/dL — ABNORMAL HIGH (ref 70–99)
Glucose-Capillary: 142 mg/dL — ABNORMAL HIGH (ref 70–99)
Glucose-Capillary: 215 mg/dL — ABNORMAL HIGH (ref 70–99)
Glucose-Capillary: 92 mg/dL (ref 70–99)

## 2018-12-22 LAB — BLOOD CULTURE ID PANEL (REFLEXED)
Acinetobacter baumannii: NOT DETECTED
Candida albicans: NOT DETECTED
Candida glabrata: NOT DETECTED
Candida krusei: NOT DETECTED
Candida parapsilosis: NOT DETECTED
Candida tropicalis: NOT DETECTED
Carbapenem resistance: NOT DETECTED
Enterobacter cloacae complex: NOT DETECTED
Enterobacteriaceae species: DETECTED — AB
Enterococcus species: NOT DETECTED
Escherichia coli: NOT DETECTED
Haemophilus influenzae: NOT DETECTED
Klebsiella oxytoca: NOT DETECTED
Klebsiella pneumoniae: NOT DETECTED
Listeria monocytogenes: NOT DETECTED
Neisseria meningitidis: NOT DETECTED
Proteus species: DETECTED — AB
Pseudomonas aeruginosa: NOT DETECTED
Serratia marcescens: NOT DETECTED
Staphylococcus aureus (BCID): NOT DETECTED
Staphylococcus species: NOT DETECTED
Streptococcus agalactiae: NOT DETECTED
Streptococcus pneumoniae: NOT DETECTED
Streptococcus pyogenes: NOT DETECTED
Streptococcus species: NOT DETECTED

## 2018-12-22 LAB — MAGNESIUM: Magnesium: 1.9 mg/dL (ref 1.7–2.4)

## 2018-12-22 LAB — BASIC METABOLIC PANEL
Anion gap: 6 (ref 5–15)
BUN: 40 mg/dL — ABNORMAL HIGH (ref 8–23)
CO2: 29 mmol/L (ref 22–32)
Calcium: 7.8 mg/dL — ABNORMAL LOW (ref 8.9–10.3)
Chloride: 102 mmol/L (ref 98–111)
Creatinine, Ser: 1.46 mg/dL — ABNORMAL HIGH (ref 0.61–1.24)
GFR calc Af Amer: 52 mL/min — ABNORMAL LOW (ref >60)
GFR calc non Af Amer: 44 mL/min — ABNORMAL LOW (ref >60)
Glucose, Bld: 135 mg/dL — ABNORMAL HIGH (ref 70–99)
Potassium: 3.8 mmol/L (ref 3.5–5.1)
Sodium: 137 mmol/L (ref 135–145)

## 2018-12-22 LAB — APTT
aPTT: 83 seconds — ABNORMAL HIGH (ref 24–36)
aPTT: 71 seconds — ABNORMAL HIGH (ref 24–36)

## 2018-12-22 LAB — HEPARIN LEVEL (UNFRACTIONATED): Heparin Unfractionated: 0.8 IU/mL — ABNORMAL HIGH (ref 0.30–0.70)

## 2018-12-22 LAB — PROCALCITONIN: Procalcitonin: 0.6 ng/mL

## 2018-12-22 LAB — ALBUMIN: Albumin: 2.1 g/dL — ABNORMAL LOW (ref 3.5–5.0)

## 2018-12-22 MED ORDER — FUROSEMIDE 20 MG PO TABS
20.0000 mg | ORAL_TABLET | Freq: Every day | ORAL | Status: DC
  Administered 2018-12-22 – 2018-12-23 (×2): 20 mg via ORAL
  Filled 2018-12-22 (×2): qty 1

## 2018-12-22 MED ORDER — ENSURE PRE-SURGERY PO LIQD
296.0000 mL | Freq: Once | ORAL | Status: AC
  Administered 2018-12-22: 296 mL via ORAL
  Filled 2018-12-22: qty 296

## 2018-12-22 MED ORDER — GABAPENTIN 100 MG PO CAPS
200.0000 mg | ORAL_CAPSULE | Freq: Three times a day (TID) | ORAL | Status: DC
  Administered 2018-12-22 – 2018-12-25 (×7): 200 mg via ORAL
  Filled 2018-12-22 (×8): qty 2

## 2018-12-22 MED ORDER — POLYETHYLENE GLYCOL 3350 17 G PO PACK: 17.0000 g | PACK | Freq: Every day | ORAL | Status: DC | PRN

## 2018-12-22 MED ORDER — MORPHINE SULFATE (PF) 2 MG/ML IV SOLN
2.0000 mg | INTRAVENOUS | Status: DC | PRN
  Administered 2018-12-22 – 2018-12-27 (×6): 2 mg via INTRAVENOUS
  Filled 2018-12-22 (×6): qty 1

## 2018-12-22 MED ORDER — CALCIUM CARBONATE 1250 (500 CA) MG PO TABS
1000.0000 mg | ORAL_TABLET | Freq: Once | ORAL | Status: AC
  Administered 2018-12-22: 1000 mg via ORAL
  Filled 2018-12-22: qty 2

## 2018-12-22 NOTE — Progress Notes (Signed)
Pharmacy Electrolyte Monitoring Consult:  Pharmacy consulted to assist in monitoring and replacing electrolytes in this 84 y.o. male admitted on 12/20/2018.  Labs:  Sodium (mmol/L)  Date Value  12/22/2018 137  01/20/2013 140   Potassium (mmol/L)  Date Value  12/22/2018 3.8  01/20/2013 3.5   Magnesium (mg/dL)  Date Value  12/22/2018 1.9   Phosphorus (mg/dL)  Date Value  12/21/2018 3.3   Calcium (mg/dL)  Date Value  12/22/2018 7.8 (L)   Calcium, Total (mg/dL)  Date Value  01/20/2013 9.4   Albumin (g/dL)  Date Value  12/20/2018 3.1 (L)  01/19/2013 3.7   Scr: 1.28 > 1.33 > 1.32 > 1.46  Corrected Ca: 8.52  Assessment/Plan: Electrolytes: Furosemide 20 mg QD (dose reduction from yesterday).   Will replace for goal potassium ~ 4 and goal magnesium ~ 2.   Glucose: Patient transitioned off insulin drip. Continued Lantus 12 units QHS. Continue Q4hr SSI.   Will order Calcium carbonate 1000 mg x1.  Will follow electrolytes with AM labs.  Pharmacy will continue to monitor and adjust per consult.   Rowland Lathe 12/22/2018 7:39 AM

## 2018-12-22 NOTE — Progress Notes (Signed)
[place] [facility] [place] NAME: [name]    MR#:  [id number]  DATE OF BIRTH:  Mar 11, 1935  SUBJECTIVE:  CHIEF COMPLAINT:   Chief Complaint  Patient presents with  . Shortness of Breath   -Alert and oriented this morning.  Remains on 2L oxygen.  Feels miserable from his right ankle pain  REVIEW OF SYSTEMS:  Review of Systems  Constitutional: Positive for malaise/fatigue. Negative for chills and fever.  HENT: Negative for congestion, ear discharge, hearing loss and nosebleeds.   Eyes: Negative for blurred vision and double vision.  Respiratory: Negative for cough, shortness of breath and wheezing.   Cardiovascular: Negative for chest pain and palpitations.  Gastrointestinal: Negative for abdominal pain, constipation, diarrhea, nausea and vomiting.  Genitourinary: Negative for dysuria and urgency.  Musculoskeletal: Positive for joint pain and myalgias.  Neurological: Negative for dizziness, focal weakness, seizures, weakness and headaches.  Psychiatric/Behavioral: Negative for depression.    DRUG ALLERGIES:   Allergies  Allergen Reactions  . Contrast Media [Iodinated Diagnostic Agents] Shortness Of Breath    SOB  . Iohexol Shortness Of Breath     Desc: Respiratory Distress, Laryngedema, diaphoresis, Onset Date: [id number]   . Metrizamide Shortness Of Breath    SOB SOB SOB   . Latex Rash    VITALS:  Blood pressure (!) 144/52, pulse (!) 57, temperature 98.5 F (36.9 C), temperature source Oral, resp. rate 17, height 5\' 7"  (1.702 m), weight 79.9 kg, SpO2 92 %.  PHYSICAL EXAMINATION:  Physical Exam  GENERAL:  84 y.o.-year-old patient lying in the bed, feels miserable secondary to right foot pain EYES: Pupils equal, round, reactive to light and accommodation. No scleral icterus. Extraocular muscles intact.  HEENT: Head atraumatic, normocephalic. Oropharynx and nasopharynx clear.  NECK:  Supple, no jugular venous distention.  No thyroid enlargement, no tenderness.  LUNGS: Normal breath sounds bilaterally, bibasilar crackles, no wheezing, rhonchi or crepitation. No use of accessory muscles of respiration.   CARDIOVASCULAR: S1, S2 normal. No  rubs, or gallops. 3/6 systolic murmur present ABDOMEN: Soft, nontender, nondistended. Bowel sounds present. No organomegaly or mass.  EXTREMITIES: s/p left BKA and right foot swelling with a medial heel ulcer. Feeble DP pulses on right foot NEUROLOGIC: Cranial nerves II through XII are intact. Muscle strength equal in all extremities. Sensation intact. Gait not checked.  PSYCHIATRIC: The patient is alert and oriented x 3 today  SKIN: No obvious rash, lesion, or ulcer.       LABORATORY PANEL:   CBC Recent Labs  Lab 12/22/18 0402  WBC 15.6*  HGB 9.3*  HCT 28.8*  PLT 277   ------------------------------------------------------------------------------------------------------------------  Chemistries  Recent Labs  Lab 12/20/18 0602  12/22/18 0402  NA 134*   < > 137  K 4.0   < > 3.8  CL 99   < > 102  CO2 23   < > 29  GLUCOSE 521*   < > 135*  BUN 28*   < > 40*  CREATININE 1.33*   < > 1.46*  CALCIUM 8.7*   < > 7.8*  MG  --    < > 1.9  AST 23  --   --   ALT 22  --   --   ALKPHOS 168*  --   --   BILITOT 1.2  --   --    < > = values in this interval not displayed.   ------------------------------------------------------------------------------------------------------------------  Cardiac Enzymes No results for input(s):  TROPONINI in the last 168 hours. ------------------------------------------------------------------------------------------------------------------  RADIOLOGY:  Mr Ankle Right Wo Contrast  Result Date: 12/21/2018 CLINICAL DATA:  Heel ulcer. EXAM: MRI OF THE RIGHT ANKLE WITHOUT CONTRAST TECHNIQUE: Multiplanar, multisequence MR imaging of the ankle was performed. No intravenous contrast was administered. COMPARISON:  12/14/2018 radiographs.  FINDINGS: TENDONS Peroneal: Intact Posteromedial: Intact Anterior: Intact Achilles: Marked tendinopathy involving the distal Achilles tendon with an overlying open wound. This is likely septic tendinopathy/tendinitis with interstitial tears but no complete rupture. There is also moderate fluid surrounding the Achilles tendon and in the pre Achilles space. Suspect dots of gas suggesting abscess. Plantar Fascia: Intact. LIGAMENTS Lateral: Intact Medial: Intact CARTILAGE Ankle Joint: Mild age related degenerative changes. No findings suspicious for septic arthritis. Subtalar Joints/Sinus Tarsi: Mild degenerative change involving the subtalar joints. No findings suspicious for septic arthritis. There is some edema and fluid in the sinus tarsi. Bones: Low T1 and high T2 signal intensity in the posterior aspect of the calcaneus consistent with osteomyelitis. Other: Moderate cellulitis and myofasciitis. IMPRESSION: 1. Large open wound involving the posterior aspect of the heel right down to the Achilles tendon. 2. Significant septic Achilles tendinopathy. 3. Pre Achilles complex fluid collection containing gas consistent with an abscess measuring approximately 4.2 x 3.0 cm. 4. Osteomyelitis involving the posterior aspect of the calcaneus. 5. Cellulitis and myofasciitis.  No findings for septic arthritis. Electronically Signed   By: Marijo Sanes M.D.   On: 12/21/2018 12:41   Dg Chest Port 1 View  Result Date: 12/21/2018 CLINICAL DATA:  CHF EXAM: PORTABLE CHEST 1 VIEW COMPARISON:  12/20/2018 FINDINGS: Mild improvement in interstitial coarsening. Stable cardiomegaly distorted by rightward rotation. Volume loss and streaky density at the right base. No effusion or pneumothorax. IMPRESSION: 1. Improved pulmonary edema. 2. Atelectasis at the right base. Electronically Signed   By: Monte Fantasia M.D.   On: 12/21/2018 10:34    EKG:   Orders placed or performed during the hospital encounter of 12/20/18  . EKG 12-Lead   . EKG 12-Lead  . ED EKG  . ED EKG    ASSESSMENT AND PLAN:   [name]  is a 84 y.o. male with a known history of atrial fibrillation on Xarelto, CAD, ischemic cardiomyopathy with EF of 35 to 40%, hypertension, peripheral vascular disease status post left BKA and right leg angioplasty 2 days ago, obstructive sleep apnea on CPAP presents to hospital secondary to confusion and worsening shortness of breath that started last night.  1.  Acute hypoxic respiratory failure-secondary to acute pulmonary edema. -Acute on chronic systolic heart failure.  Last cardiac cath was in January 2020 showing chronic occlusions and collaterals from LAD.  Medical management recommended.  EF was around 35%. -Patient was admitted on high flow nasal cannula, much improved with IV diuresis. -Renal function slowly getting worse, breathing is much improved as well as chest x-ray. -Currently on 2 L oxygen which is acute.  Change Lasix to oral  2. sepsis-present on admission.  Secondary to  Diabetic right heel ulcer-appears necrotic, ischemic ulcer.  Status post recent revascularization and arterial stents placement on 12/18/2018. -Appreciate ID, podiatry and vascular consult. -Appreciate podiatry, ID and vascular consult.  -WBC is improving while on antibiotics.  Blood cultures growing gram-negative rods.  Wound cultures so far growing Pseudomonas.  Patient is on Zosyn -MRI of the ankle -showing large wound in the posterior aspect of the heel, septic Achilles tendinopathy and possible abscess and pre-Achilles region with osteomyelitis of the calcaneus with  cellulitis and mild fasciitis. -Per podiatry, patient might need amputation either BKA/AKA.  Will discuss with patient about possible debridement with wound VAC versus amputation.  3.  Uncontrolled diabetes mellitus- A1c is 8.9, sugars were significantly elevated on admission likely from sepsis. -Patient currently on Lantus and sliding scale insulin  4.  A.  fib-rate controlled.  Continue cardiac medications.  Patient on Cardizem, metoprolol and amiodarone -Xarelto on hold for possible procedure.   on heparin drip  5.  CKD stage III-stable.  Continue to monitor  6.  DVT prophylaxis-on heparin drip  7.  Confusion-secondary to acute metabolic encephalopathy from underlying infection and pain medications. Improving this morning.,  Has some sundowning in the evening   Will need PT consult after podiatry evaluation and procedures.  Wheelchair-bound with walker transfers at baseline Wife has been updated     All the records are reviewed and case discussed with Care Management/Social Workerr. Management plans discussed with the patient, family and they are in agreement.  CODE STATUS: Full Code  TOTAL TIME TAKING CARE OF THIS PATIENT: 38 minutes.   POSSIBLE D/C IN 2 DAYS, DEPENDING ON CLINICAL CONDITION.   [name] M.D on 12/22/2018 at 8:51 AM  Between 7am to 6pm - Pager - [phone]  After 6pm go to [email or web address] - password [email or web address] [facility]  Office  [phone]  CC: Primary care physician; Corey Skains, MD

## 2018-12-22 NOTE — Progress Notes (Signed)
PHARMACY - PHYSICIAN COMMUNICATION CRITICAL VALUE ALERT - BLOOD CULTURE IDENTIFICATION (BCID)  [name] is an 84 y.o. male who presented to [facility] on 12/20/2018 with a chief complaint of Diabetic right heel ulcer (necrosis).   Assessment:  Blood cultures growing proteus species (1/4 bottles; aerobic). KPC not detected. Wound cultures so far growing Pseudomonas. Patient would is necrotic and will need anaerobic coverage.  Name of physician (or Provider) Contacted: Dr. Tressia Miners and Dr. Delaine Lame  Current antibiotics: Zosyn  Changes to prescribed antibiotics recommended:  Patient is on recommended antibiotics - No changes needed  Results for orders placed or performed during the hospital encounter of 12/20/18  Blood Culture ID Panel (Reflexed) (Collected: 12/20/2018 11:36 AM)  Result Value Ref Range   Enterococcus species NOT DETECTED NOT DETECTED   Listeria monocytogenes NOT DETECTED NOT DETECTED   Staphylococcus species NOT DETECTED NOT DETECTED   Staphylococcus aureus (BCID) NOT DETECTED NOT DETECTED   Streptococcus species NOT DETECTED NOT DETECTED   Streptococcus agalactiae NOT DETECTED NOT DETECTED   Streptococcus pneumoniae NOT DETECTED NOT DETECTED   Streptococcus pyogenes NOT DETECTED NOT DETECTED   Acinetobacter baumannii NOT DETECTED NOT DETECTED   Enterobacteriaceae species DETECTED (A) NOT DETECTED   Enterobacter cloacae complex NOT DETECTED NOT DETECTED   Escherichia coli NOT DETECTED NOT DETECTED   Klebsiella oxytoca NOT DETECTED NOT DETECTED   Klebsiella pneumoniae NOT DETECTED NOT DETECTED   Proteus species DETECTED (A) NOT DETECTED   Serratia marcescens NOT DETECTED NOT DETECTED   Carbapenem resistance NOT DETECTED NOT DETECTED   Haemophilus influenzae NOT DETECTED NOT DETECTED   Neisseria meningitidis NOT DETECTED NOT DETECTED   Pseudomonas aeruginosa NOT DETECTED NOT DETECTED   Candida albicans NOT DETECTED NOT DETECTED   Candida glabrata NOT  DETECTED NOT DETECTED   Candida krusei NOT DETECTED NOT DETECTED   Candida parapsilosis NOT DETECTED NOT DETECTED   Candida tropicalis NOT DETECTED NOT DETECTED   Thank you for allowing pharmacy to be a part of this patient's care.   Rowland Lathe 12/22/2018  10:23 AM

## 2018-12-22 NOTE — Progress Notes (Signed)
ANTICOAGULATION CONSULT NOTE  Pharmacy Consult for Heparin Drip Management  Indication: atrial fibrillation  Patient Measurements: Height: 5\' 7"  (170.2 cm) Weight: 176 lb 2.4 oz (79.9 kg) IBW/kg (Calculated) : 66.1  Vital Signs: Temp: 97.9 F (36.6 C) (07/31 0548) Temp Source: Oral (07/31 0548) BP: 148/55 (07/31 0548) Pulse Rate: 56 (07/31 0548)  Labs: Recent Labs    12/20/18 0602  12/21/18 0054  12/21/18 1409 12/21/18 1721 12/22/18 0402  HGB 11.6*  --  9.8*  --   --   --  9.3*  HCT 35.4*  --  29.0*  --   --   --  28.8*  PLT 367  --  299  --   --   --  277  APTT  --    < > 41*   < > 46* 52* 83*  HEPARINUNFRC  --   --   --   --   --   --  0.80*  CREATININE 1.33*  --  1.32*  --   --   --  1.46*  TROPONINIHS 234*  --   --   --   --   --   --    < > = values in this interval not displayed.    Estimated Creatinine Clearance: 40.2 mL/min (A) (by C-G formula based on SCr of 1.46 mg/dL (H)).   Medications:  Scheduled:  . amiodarone  200 mg Oral Daily  . aspirin EC  81 mg Oral Daily  . atorvastatin  80 mg Oral QPM  . Chlorhexidine Gluconate Cloth  6 each Topical Daily  . diltiazem  120 mg Oral Daily  . ferrous sulfate  325 mg Oral Q breakfast  . furosemide  40 mg Intravenous Daily  . insulin aspart  0-15 Units Subcutaneous Q4H  . insulin glargine  12 Units Subcutaneous QHS  . mouth rinse  15 mL Mouth Rinse BID  . metoprolol tartrate  25 mg Oral TID   Infusions:  . heparin 1,400 Units/hr (12/22/18 0400)  . piperacillin-tazobactam (ZOSYN)  IV Stopped (12/22/18 0326)    Assessment: Pharmacy consulted for heparin drip management for 84 yo male admitted on 7/29 with right heel ulcer and confusion. Patient with angioplasty on 7/27. Patient takes rivaroxaban 15mg  with supper. Last dose 7/28 at 1700. Patient with history significant for atrial fibrillation, CHF, CKD, diabetes, hypertension, hyperlipidemia, left BKA.   Goal of Therapy:  Heparin level 0.3-0.7 units/ml aPTT  66-102 seconds Monitor platelets by anticoagulation protocol: Yes  Heparin Course: 7/29 PM initiation: drip initated at 1200 units/hr (no bolus) 7/30 0100 aPTT 41s: 1200 units/h 7/30 0630 aPTT 58s: inc rate to 1300 units/hr 7/30 1721 aPTT 52s: inc rate to 1400 units/hr   Plan:  07/31 @ 0400 aPTT 83 seconds therapeutic, HL 0.80 supratherapeutic and not correlating. Will continue rate at 1400 units/hr and will recheck aPTT @ 1200 and HL w/ am labs to assess correlation, Hgb down to 9.3 this am, will continue to monitor.  Tobie Lords, PharmD, BCPS Clinical Pharmacist 12/22/2018,6:15 AM

## 2018-12-22 NOTE — Progress Notes (Addendum)
ANTICOAGULATION CONSULT NOTE  Pharmacy Consult for Heparin Drip Management  Indication: atrial fibrillation  Patient Measurements: Height: 5\' 7"  (170.2 cm) Weight: 176 lb 2.4 oz (79.9 kg) IBW/kg (Calculated) : 66.1  Vital Signs: Temp: 98.5 F (36.9 C) (07/31 0823) Temp Source: Oral (07/31 0823) BP: 144/52 (07/31 0823) Pulse Rate: 57 (07/31 0823)  Labs: Recent Labs    12/20/18 0602  12/21/18 0054  12/21/18 1721 12/22/18 0402 12/22/18 1221  HGB 11.6*  --  9.8*  --   --  9.3*  --   HCT 35.4*  --  29.0*  --   --  28.8*  --   PLT 367  --  299  --   --  277  --   APTT  --    < > 41*   < > 52* 83* 71*  HEPARINUNFRC  --   --   --   --   --  0.80*  --   CREATININE 1.33*  --  1.32*  --   --  1.46*  --   TROPONINIHS 234*  --   --   --   --   --   --    < > = values in this interval not displayed.    Estimated Creatinine Clearance: 40.2 mL/min (A) (by C-G formula based on SCr of 1.46 mg/dL (H)).   Medications:  Scheduled:  . amiodarone  200 mg Oral Daily  . aspirin EC  81 mg Oral Daily  . atorvastatin  80 mg Oral QPM  . Chlorhexidine Gluconate Cloth  6 each Topical Daily  . diltiazem  120 mg Oral Daily  . ferrous sulfate  325 mg Oral Q breakfast  . furosemide  20 mg Oral Daily  . gabapentin  200 mg Oral TID  . insulin aspart  0-15 Units Subcutaneous Q4H  . insulin glargine  12 Units Subcutaneous QHS  . mouth rinse  15 mL Mouth Rinse BID  . metoprolol tartrate  25 mg Oral TID   Infusions:  . heparin 1,400 Units/hr (12/22/18 1059)  . piperacillin-tazobactam (ZOSYN)  IV 3.375 g (12/22/18 0626)    Assessment: Pharmacy consulted for heparin drip management for 84 yo male admitted on 7/29 with right heel ulcer and confusion. Patient with angioplasty on 7/27. Patient takes rivaroxaban 15mg  with supper. Last dose 7/28 at 1700. Patient with history significant for atrial fibrillation, CHF, CKD, diabetes, hypertension, hyperlipidemia, left BKA.   Goal of Therapy:  Heparin  level 0.3-0.7 units/ml aPTT 66-102 seconds Monitor platelets by anticoagulation protocol: Yes  Heparin Course: 7/29 PM initiation: drip initated at 1200 units/hr (no bolus) 7/30 0100 aPTT 41s: 1200 units/h 7/30 0630 aPTT 58s: inc rate to 1300 units/hr 7/30 1721 aPTT 52s: inc rate to 1400 units/hr 7/31 0400 aPTT 83 seconds therapeutic, HL 0.80 supratherapeutic and not correlating. 7/31 1221 aPTT 71s: Therapeutic x2.    Plan:  7/31 1221 aPTT 71s: Therapeutic x2.  Will continue rate at 1400 units/hr and will recheck aPTT @ 0500 and HL w/ am labs to assess correlation, Hgb down to 9.3 this am, will continue to monitor.  Will follow up on status of possible surgery for perioperative management of heparin gtt.   Kristeen Miss, PharmD Clinical Pharmacist 12/22/2018,1:49 PM

## 2018-12-22 NOTE — Progress Notes (Signed)
ID  Complains of body pain  BP (!) 144/52 (BP Location: Left Arm)   Pulse (!) 57   Temp 98.5 F (36.9 C) (Oral)   Resp 17   Ht 5\' 7"  (1.702 m)   Wt 79.9 kg   SpO2 92%   BMI 27.59 kg/m    On examination in some distress when he moves the leg. Right leg heel has a necrotic wound.  Left BKA  CBC Latest Ref Rng & Units 12/22/2018 12/21/2018 12/20/2018  WBC 4.0 - 10.5 K/uL 15.6(H) 22.8(H) 28.3(H)  Hemoglobin 13.0 - 17.0 g/dL 9.3(L) 9.8(L) 11.6(L)  Hematocrit 39.0 - 52.0 % 28.8(L) 29.0(L) 35.4(L)  Platelets 150 - 400 K/uL 277 299 367    CMP Latest Ref Rng & Units 12/22/2018 12/21/2018 12/20/2018  Glucose 70 - 99 mg/dL 135(H) 177(H) 521(HH)  BUN 8 - 23 mg/dL 40(H) 33(H) 28(H)  Creatinine 0.61 - 1.24 mg/dL 1.46(H) 1.32(H) 1.33(H)  Sodium 135 - 145 mmol/L 137 136 134(L)  Potassium 3.5 - 5.1 mmol/L 3.8 3.5 4.0  Chloride 98 - 111 mmol/L 102 99 99  CO2 22 - 32 mmol/L 29 29 23   Calcium 8.9 - 10.3 mg/dL 7.8(L) 8.2(L) 8.7(L)  Total Protein 6.5 - 8.1 g/dL - - 7.8  Total Bilirubin 0.3 - 1.2 mg/dL - - 1.2  Alkaline Phos 38 - 126 U/L - - 168(H)  AST 15 - 41 U/L - - 23  ALT 0 - 44 U/L - - 22    BC- 1/4 proteus wC- pseudomonas  Impression and recommendation 84 year old male with history of CHF, A. fib, peripheral artery disease, right heel ischemic ulcer, recent angiogram and angioplasty and stent placement to the right leg presents with shortness of breath and chest pain.  Acute pulmonary edema secondary to congestive heart failure: On furosemide. Management as per primary team  Proteus bacteremia - 1of 4 bottle-source possibly is the right heel infected ulcer.  Continue zosyn  Chronic necrotic/ischemic right heel ulcer on Zosyn. . Seen by Dr. Vickki Muff.  MRI of the foot shows large open wound involving the posterior aspect of the heel right down to the Achilles tendon.  Significant septic Achilles tendinopathy is seen.  Pre-ankle  complex fluid collection containing gas consistent with  an abscess measuring approximately 4.23 cm.  And osteomyelitis involving the posterior aspect of the calcaneum.    Wound cultures so far as Pseudomonas.  On Zosyn He needs debridement of the infected material VS  BKA    peripheral arterial diease s/p angioplasty rt leg  Discussed the management with Dr. Vickki Muff. ID will follow peripherally this weekend call if needed.

## 2018-12-22 NOTE — Progress Notes (Signed)
Daily Progress Note   Subjective  - * No surgery date entered *  F/u right heel  Objective Vitals:   12/22/18 0400 12/22/18 0548 12/22/18 0823 12/22/18 1520  BP: (!) 121/39 (!) 148/55 (!) 144/52 (!) 139/58  Pulse: (!) 50 (!) 56 (!) 57 (!) 59  Resp: 10  17 15   Temp:  97.9 F (36.6 C) 98.5 F (36.9 C) 98.5 F (36.9 C)  TempSrc:  Oral Oral   SpO2: 98% 100% 92% 94%  Weight:      Height:        Physical Exam: Large heel ulcer.  MRI:  IMPRESSION: 1. Large open wound involving the posterior aspect of the heel right down to the Achilles tendon. 2. Significant septic Achilles tendinopathy. 3. Pre Achilles complex fluid collection containing gas consistent with an abscess measuring approximately 4.2 x 3.0 cm. 4. Osteomyelitis involving the posterior aspect of the calcaneus. 5. Cellulitis and myofasciitis.  No findings for septic arthritis.    Laboratory CBC    Component Value Date/Time   WBC 15.6 (H) 12/22/2018 0402   HGB 9.3 (L) 12/22/2018 0402   HGB 16.3 01/20/2013 0255   HCT 28.8 (L) 12/22/2018 0402   HCT 47.9 01/20/2013 0255   PLT 277 12/22/2018 0402   PLT 245 01/20/2013 0255    BMET    Component Value Date/Time   NA 137 12/22/2018 0402   NA 140 01/20/2013 0255   K 3.8 12/22/2018 0402   K 3.5 01/20/2013 0255   CL 102 12/22/2018 0402   CL 103 01/20/2013 0255   CO2 29 12/22/2018 0402   CO2 30 01/20/2013 0255   GLUCOSE 135 (H) 12/22/2018 0402   GLUCOSE 145 (H) 01/20/2013 0255   BUN 40 (H) 12/22/2018 0402   BUN 22 (H) 01/20/2013 0255   CREATININE 1.46 (H) 12/22/2018 0402   CREATININE 1.09 01/20/2013 0255   CALCIUM 7.8 (L) 12/22/2018 0402   CALCIUM 9.4 01/20/2013 0255   GFRNONAA 44 (L) 12/22/2018 0402   GFRNONAA >60 01/20/2013 0255   GFRAA 52 (L) 12/22/2018 0402   GFRAA >60 01/20/2013 0255    Assessment/Planning: Osteomyelitis with heel ulcer right  PVD   D/W pt, wife and son and pt and son wish to proceed with debridment and attempted limb  salvage for now.  They undertand this may not work and can elect for amputation in the future.  Plan for surgery in am  Heparin drip d/c'd.  Orders placed.  Samara Deist A  12/22/2018, 5:22 PM

## 2018-12-23 ENCOUNTER — Inpatient Hospital Stay: Payer: Medicare Other | Admitting: Anesthesiology

## 2018-12-23 ENCOUNTER — Encounter
Admission: EM | Disposition: A | Discharge status: Skilled Nursing Facility | Payer: Self-pay | Source: Home / Self Care | Attending: Internal Medicine

## 2018-12-23 HISTORY — PX: IRRIGATION AND DEBRIDEMENT FOOT: SHX6602

## 2018-12-23 LAB — BASIC METABOLIC PANEL
Anion gap: 7 (ref 5–15)
BUN: 33 mg/dL — ABNORMAL HIGH (ref 8–23)
CO2: 30 mmol/L (ref 22–32)
Calcium: 8.1 mg/dL — ABNORMAL LOW (ref 8.9–10.3)
Chloride: 100 mmol/L (ref 98–111)
Creatinine, Ser: 1.39 mg/dL — ABNORMAL HIGH (ref 0.61–1.24)
GFR calc Af Amer: 55 mL/min — ABNORMAL LOW (ref >60)
GFR calc non Af Amer: 47 mL/min — ABNORMAL LOW (ref >60)
Glucose, Bld: 204 mg/dL — ABNORMAL HIGH (ref 70–99)
Potassium: 4.2 mmol/L (ref 3.5–5.1)
Sodium: 137 mmol/L (ref 135–145)

## 2018-12-23 LAB — GLUCOSE, CAPILLARY
Glucose-Capillary: 103 mg/dL — ABNORMAL HIGH (ref 70–99)
Glucose-Capillary: 108 mg/dL — ABNORMAL HIGH (ref 70–99)
Glucose-Capillary: 148 mg/dL — ABNORMAL HIGH (ref 70–99)
Glucose-Capillary: 189 mg/dL — ABNORMAL HIGH (ref 70–99)
Glucose-Capillary: 316 mg/dL — ABNORMAL HIGH (ref 70–99)

## 2018-12-23 LAB — CBC
HCT: 32.4 % — ABNORMAL LOW (ref 39.0–52.0)
Hemoglobin: 10.6 g/dL — ABNORMAL LOW (ref 13.0–17.0)
MCH: 28.9 pg (ref 26.0–34.0)
MCHC: 32.7 g/dL (ref 30.0–36.0)
MCV: 88.3 fL (ref 80.0–100.0)
Platelets: 296 10*3/uL (ref 150–400)
RBC: 3.67 MIL/uL — ABNORMAL LOW (ref 4.22–5.81)
RDW: 14.4 % (ref 11.5–15.5)
WBC: 16.3 10*3/uL — ABNORMAL HIGH (ref 4.0–10.5)
nRBC: 0 % (ref 0.0–0.2)

## 2018-12-23 LAB — APTT: aPTT: 26 seconds (ref 24–36)

## 2018-12-23 LAB — PHOSPHORUS: Phosphorus: 3.6 mg/dL (ref 2.5–4.6)

## 2018-12-23 LAB — MAGNESIUM: Magnesium: 2 mg/dL (ref 1.7–2.4)

## 2018-12-23 SURGERY — IRRIGATION AND DEBRIDEMENT FOOT
Anesthesia: General | Laterality: Right

## 2018-12-23 MED ORDER — LIDOCAINE HCL (PF) 1 % IJ SOLN
INTRAMUSCULAR | Status: AC
  Filled 2018-12-23: qty 30

## 2018-12-23 MED ORDER — ROCURONIUM BROMIDE 100 MG/10ML IV SOLN
INTRAVENOUS | Status: DC | PRN
  Administered 2018-12-23: 50 mg via INTRAVENOUS

## 2018-12-23 MED ORDER — ONDANSETRON HCL 4 MG/2ML IJ SOLN
INTRAMUSCULAR | Status: AC
  Filled 2018-12-23: qty 2

## 2018-12-23 MED ORDER — SODIUM CHLORIDE 0.9 % IV SOLN
INTRAVENOUS | Status: DC | PRN
  Administered 2018-12-23: 10:00:00 via INTRAVENOUS

## 2018-12-23 MED ORDER — SUGAMMADEX SODIUM 200 MG/2ML IV SOLN
INTRAVENOUS | Status: AC
  Filled 2018-12-23: qty 2

## 2018-12-23 MED ORDER — LIDOCAINE-EPINEPHRINE 1 %-1:100000 IJ SOLN
INTRAMUSCULAR | Status: AC
  Filled 2018-12-23: qty 1

## 2018-12-23 MED ORDER — HEPARIN (PORCINE) 25000 UT/250ML-% IV SOLN
1350.0000 [IU]/h | INTRAVENOUS | Status: DC
  Administered 2018-12-24: 1400 [IU]/h via INTRAVENOUS
  Administered 2018-12-25 – 2018-12-26 (×2): 1200 [IU]/h via INTRAVENOUS
  Filled 2018-12-23 (×5): qty 250

## 2018-12-23 MED ORDER — FENTANYL CITRATE (PF) 100 MCG/2ML IJ SOLN: 25.0000 ug | INTRAMUSCULAR | Status: DC | PRN

## 2018-12-23 MED ORDER — PHENYLEPHRINE HCL (PRESSORS) 10 MG/ML IV SOLN
INTRAVENOUS | Status: DC | PRN
  Administered 2018-12-23: 160 ug via INTRAVENOUS
  Administered 2018-12-23 (×4): 100 ug via INTRAVENOUS
  Administered 2018-12-23: 80 ug via INTRAVENOUS

## 2018-12-23 MED ORDER — LIDOCAINE-EPINEPHRINE 1 %-1:100000 IJ SOLN
INTRAMUSCULAR | Status: DC | PRN
  Administered 2018-12-23: 10 mL

## 2018-12-23 MED ORDER — PROPOFOL 10 MG/ML IV BOLUS
INTRAVENOUS | Status: DC | PRN
  Administered 2018-12-23: 70 mg via INTRAVENOUS

## 2018-12-23 MED ORDER — PROPOFOL 10 MG/ML IV BOLUS
INTRAVENOUS | Status: AC
  Filled 2018-12-23: qty 20

## 2018-12-23 MED ORDER — FENTANYL CITRATE (PF) 100 MCG/2ML IJ SOLN
INTRAMUSCULAR | Status: DC | PRN
  Administered 2018-12-23: 50 ug via INTRAVENOUS

## 2018-12-23 MED ORDER — LIDOCAINE HCL (CARDIAC) PF 100 MG/5ML IV SOSY
PREFILLED_SYRINGE | INTRAVENOUS | Status: DC | PRN
  Administered 2018-12-23: 80 mg via INTRAVENOUS

## 2018-12-23 MED ORDER — BUPIVACAINE HCL 0.5 % IJ SOLN
INTRAMUSCULAR | Status: DC | PRN
  Administered 2018-12-23: 10 mL

## 2018-12-23 MED ORDER — FENTANYL CITRATE (PF) 100 MCG/2ML IJ SOLN
INTRAMUSCULAR | Status: AC
  Filled 2018-12-23: qty 2

## 2018-12-23 MED ORDER — BUPIVACAINE HCL (PF) 0.5 % IJ SOLN
INTRAMUSCULAR | Status: AC
  Filled 2018-12-23: qty 30

## 2018-12-23 MED ORDER — ROCURONIUM BROMIDE 50 MG/5ML IV SOLN
INTRAVENOUS | Status: AC
  Filled 2018-12-23: qty 1

## 2018-12-23 MED ORDER — LIDOCAINE HCL (PF) 2 % IJ SOLN
INTRAMUSCULAR | Status: AC
  Filled 2018-12-23: qty 10

## 2018-12-23 MED ORDER — SUGAMMADEX SODIUM 200 MG/2ML IV SOLN
INTRAVENOUS | Status: DC | PRN
  Administered 2018-12-23: 200 mg via INTRAVENOUS

## 2018-12-23 MED ORDER — ONDANSETRON HCL 4 MG/2ML IJ SOLN
INTRAMUSCULAR | Status: DC | PRN
  Administered 2018-12-23: 4 mg via INTRAVENOUS

## 2018-12-23 MED ORDER — POVIDONE-IODINE 10 % EX SWAB: 2.0000 "application " | Freq: Once | CUTANEOUS | Status: DC

## 2018-12-23 MED ORDER — SUCCINYLCHOLINE CHLORIDE 20 MG/ML IJ SOLN
INTRAMUSCULAR | Status: AC
  Filled 2018-12-23: qty 1

## 2018-12-23 SURGICAL SUPPLY — 63 items
BLADE OSC/SAGITTAL MD 5.5X18 (BLADE) IMPLANT
BLADE OSCILLATING/SAGITTAL (BLADE)
BLADE SW THK.38XMED LNG THN (BLADE) IMPLANT
BNDG COHESIVE 4X5 TAN STRL (GAUZE/BANDAGES/DRESSINGS) ×2 IMPLANT
BNDG COHESIVE 6X5 TAN STRL LF (GAUZE/BANDAGES/DRESSINGS) ×2 IMPLANT
BNDG CONFORM 2 STRL LF (GAUZE/BANDAGES/DRESSINGS) IMPLANT
BNDG CONFORM 3 STRL LF (GAUZE/BANDAGES/DRESSINGS) IMPLANT
BNDG ELASTIC 4X5.8 VLCR STR LF (GAUZE/BANDAGES/DRESSINGS) IMPLANT
BNDG ESMARK 4X12 TAN STRL LF (GAUZE/BANDAGES/DRESSINGS) ×2 IMPLANT
BNDG GAUZE 4.5X4.1 6PLY STRL (MISCELLANEOUS) IMPLANT
CANISTER SUCT 1200ML W/VALVE (MISCELLANEOUS) IMPLANT
CANISTER SUCT 3000ML PPV (MISCELLANEOUS) ×6 IMPLANT
CANISTER WOUND CARE 500ML ATS (WOUND CARE) ×2 IMPLANT
COVER WAND RF STERILE (DRAPES) ×2 IMPLANT
CUFF TOURN SGL QUICK 12 (TOURNIQUET CUFF) IMPLANT
CUFF TOURN SGL QUICK 18X4 (TOURNIQUET CUFF) IMPLANT
DRAPE FLUOR MINI C-ARM 54X84 (DRAPES) IMPLANT
DRAPE XRAY CASSETTE 23X24 (DRAPES) IMPLANT
DRESSING ALLEVYN 4X4 (MISCELLANEOUS) ×6 IMPLANT
DURAPREP 26ML APPLICATOR (WOUND CARE) IMPLANT
ELECT REM PT RETURN 9FT ADLT (ELECTROSURGICAL) ×2
ELECTRODE REM PT RTRN 9FT ADLT (ELECTROSURGICAL) ×1 IMPLANT
GAUZE PACKING 1/4 X5 YD (GAUZE/BANDAGES/DRESSINGS) ×2 IMPLANT
GAUZE PACKING IODOFORM 1X5 (MISCELLANEOUS) IMPLANT
GAUZE SPONGE 4X4 12PLY STRL (GAUZE/BANDAGES/DRESSINGS) IMPLANT
GAUZE XEROFORM 1X8 LF (GAUZE/BANDAGES/DRESSINGS) IMPLANT
GLOVE BIO SURGEON STRL SZ7.5 (GLOVE) ×6 IMPLANT
GLOVE INDICATOR 8.0 STRL GRN (GLOVE) ×2 IMPLANT
GOWN STRL REUS W/ TWL LRG LVL3 (GOWN DISPOSABLE) ×2 IMPLANT
GOWN STRL REUS W/TWL LRG LVL3 (GOWN DISPOSABLE) ×2
GOWN STRL REUS W/TWL MED LVL3 (GOWN DISPOSABLE) IMPLANT
HANDPIECE VERSAJET DEBRIDEMENT (MISCELLANEOUS) ×2 IMPLANT
IV NS 1000ML (IV SOLUTION)
IV NS 1000ML BAXH (IV SOLUTION) IMPLANT
KIT DRSG VAC SLVR GRANUFM (MISCELLANEOUS) ×2 IMPLANT
KIT TURNOVER KIT A (KITS) ×2 IMPLANT
LABEL OR SOLS (LABEL) ×2 IMPLANT
NEEDLE FILTER BLUNT 18X 1/2SAF (NEEDLE) ×1
NEEDLE FILTER BLUNT 18X1 1/2 (NEEDLE) ×1 IMPLANT
NEEDLE HYPO 25X1 1.5 SAFETY (NEEDLE) ×2 IMPLANT
NS IRRIG 500ML POUR BTL (IV SOLUTION) ×2 IMPLANT
PACK EXTREMITY ARMC (MISCELLANEOUS) ×2 IMPLANT
PAD ABD DERMACEA PRESS 5X9 (GAUZE/BANDAGES/DRESSINGS) IMPLANT
PULSAVAC PLUS IRRIG FAN TIP (DISPOSABLE)
RASP SM TEAR CROSS CUT (RASP) IMPLANT
SHIELD FULL FACE ANTIFOG 7M (MISCELLANEOUS) ×2 IMPLANT
SOL .9 NS 3000ML IRR  AL (IV SOLUTION)
SOL .9 NS 3000ML IRR UROMATIC (IV SOLUTION) IMPLANT
SOL PREP PVP 2OZ (MISCELLANEOUS) ×2
SOLUTION PREP PVP 2OZ (MISCELLANEOUS) ×1 IMPLANT
STOCKINETTE IMPERVIOUS 9X36 MD (GAUZE/BANDAGES/DRESSINGS) ×2 IMPLANT
SUT ETHILON 2 0 FS 18 (SUTURE) IMPLANT
SUT ETHILON 4-0 (SUTURE)
SUT ETHILON 4-0 FS2 18XMFL BLK (SUTURE)
SUT VIC AB 3-0 SH 27 (SUTURE)
SUT VIC AB 3-0 SH 27X BRD (SUTURE) IMPLANT
SUT VIC AB 4-0 FS2 27 (SUTURE) IMPLANT
SUTURE ETHLN 4-0 FS2 18XMF BLK (SUTURE) IMPLANT
SWAB CULTURE AMIES ANAERIB BLU (MISCELLANEOUS) ×2 IMPLANT
SYR 10ML LL (SYRINGE) ×4 IMPLANT
SYR 3ML LL SCALE MARK (SYRINGE) ×2 IMPLANT
TIP FAN IRRIG PULSAVAC PLUS (DISPOSABLE) IMPLANT
TRAY PREP VAG/GEN (MISCELLANEOUS) ×2 IMPLANT

## 2018-12-23 NOTE — Progress Notes (Signed)
ANTICOAGULATION CONSULT NOTE  Pharmacy Consult for Heparin Drip Management  Indication: atrial fibrillation  Patient Measurements: Height: 5\' 7"  (170.2 cm) Weight: 176 lb 2.4 oz (79.9 kg) IBW/kg (Calculated) : 66.1  Vital Signs: Temp: 99.9 F (37.7 C) (08/01 1124) Temp Source: Oral (08/01 0009) BP: 157/65 (08/01 1124) Pulse Rate: 75 (08/01 1124)  Labs: Recent Labs    12/21/18 0054  12/22/18 0402 12/22/18 1221 12/23/18 0515  HGB 9.8*  --  9.3*  --  10.6*  HCT 29.0*  --  28.8*  --  32.4*  PLT 299  --  277  --  296  APTT 41*   < > 83* 71* 26  HEPARINUNFRC  --   --  0.80*  --   --   CREATININE 1.32*  --  1.46*  --  1.39*   < > = values in this interval not displayed.    Estimated Creatinine Clearance: 42.2 mL/min (A) (by C-G formula based on SCr of 1.39 mg/dL (H)).   Medications:  Scheduled:  . [MAR Hold] amiodarone  200 mg Oral Daily  . [MAR Hold] aspirin EC  81 mg Oral Daily  . [MAR Hold] atorvastatin  80 mg Oral QPM  . [MAR Hold] Chlorhexidine Gluconate Cloth  6 each Topical Daily  . [MAR Hold] diltiazem  120 mg Oral Daily  . [MAR Hold] ferrous sulfate  325 mg Oral Q breakfast  . [MAR Hold] furosemide  20 mg Oral Daily  . [MAR Hold] gabapentin  200 mg Oral TID  . [MAR Hold] insulin aspart  0-15 Units Subcutaneous Q4H  . [MAR Hold] insulin glargine  12 Units Subcutaneous QHS  . [MAR Hold] mouth rinse  15 mL Mouth Rinse BID  . [MAR Hold] metoprolol tartrate  25 mg Oral TID  . povidone-iodine  2 application Topical Once   Infusions:  . [MAR Hold] piperacillin-tazobactam (ZOSYN)  IV 3.375 g (12/23/18 0542)    Assessment: Pharmacy consulted for heparin drip management for 84 yo male admitted on 7/29 with right heel ulcer and confusion. Patient with angioplasty on 7/27. Patient takes rivaroxaban 15mg  with supper. Last dose 7/28 at 1700. Patient with history significant for atrial fibrillation, CHF, CKD, diabetes, hypertension, hyperlipidemia, left BKA.   Goal of  Therapy:  Heparin level 0.3-0.7 units/ml aPTT 66-102 seconds Monitor platelets by anticoagulation protocol: Yes  Heparin Course: 7/29 PM initiation: drip initated at 1200 units/hr (no bolus) 7/30 0100 aPTT 41s: 1200 units/h 7/30 0630 aPTT 58s: inc rate to 1300 units/hr 7/30 1721 aPTT 52s: inc rate to 1400 units/hr 7/31 0400 aPTT 83 seconds therapeutic, HL 0.80 supratherapeutic and not correlating. 7/31 1221 aPTT 71s: Therapeutic x2.  7/31 1857 Heparin stopped    Plan:  Will restart rate at 1400 units/hr on 8/2 per consult and will recheck aPTT 8 hours from start on heparin and HL/CBC w/ am labs., CBC seems to be stable. Continue to monitor.   Eleonore Chiquito, PharmD, BCPS Clinical Pharmacist 12/23/2018,11:34 AM

## 2018-12-23 NOTE — Transfer of Care (Signed)
Immediate Anesthesia Transfer of Care Note  Patient: [name]  Procedure(s) Performed: IRRIGATION AND DEBRIDEMENT FOOT and wound vac application (Right )  Patient Location: PACU  Anesthesia Type:General  Level of Consciousness: drowsy and patient cooperative  Airway & Oxygen Therapy: Patient Spontanous Breathing and Patient connected to face mask oxygen  Post-op Assessment: Report given to RN and Post -op Vital signs reviewed and stable  Post vital signs: Reviewed and stable  Last Vitals:  Vitals Value Taken Time  BP 157/65 12/23/18 1125  Temp 37.7 C 12/23/18 1124  Pulse 74 12/23/18 1126  Resp 25 12/23/18 1126  SpO2 100 % 12/23/18 1126  Vitals shown include unvalidated device data.  Last Pain:  Vitals:   12/23/18 1124  TempSrc:   PainSc: Asleep      Patients Stated Pain Goal: 10 (79/15/04 1364)  Complications: No apparent anesthesia complications

## 2018-12-23 NOTE — Plan of Care (Signed)

## 2018-12-23 NOTE — Anesthesia Preprocedure Evaluation (Addendum)
Anesthesia Evaluation  Patient identified by MRN, date of birth, ID band Patient awake    Reviewed: Allergy & Precautions, H&P , NPO status , Patient's Chart, lab work & pertinent test results  Airway Mallampati: II  TM Distance: >3 FB     Dental  (+) Edentulous Lower, Upper Dentures   Pulmonary sleep apnea , former smoker,           Cardiovascular hypertension, + Past MI, + Peripheral Vascular Disease and +CHF  + dysrhythmias Atrial Fibrillation      Neuro/Psych neg Seizures PSYCHIATRIC DISORDERS Dementia negative neurological ROS     GI/Hepatic negative GI ROS, Neg liver ROS,   Endo/Other  negative endocrine ROSdiabetes, Poorly Controlled, Insulin Dependent  Renal/GU CRFRenal disease     Musculoskeletal   Abdominal   Peds  Hematology negative hematology ROS (+)   Anesthesia Other Findings Past Medical History: No date: Arthritis No date: Atrial fibrillation (HCC) No date: Carcinoma of prostate (HCC) No date: CHF (congestive heart failure) (HCC) No date: Chronic kidney disease No date: Diabetes mellitus without complication (HCC) No date: ED (erectile dysfunction) No date: Frequent urination No date: Hyperlipidemia No date: Hypertension No date: Moderate mitral insufficiency No date: Peripheral vascular disease ([place]) 04/2016: Pneumonia No date: Prostate cancer ([place]) No date: Sleep apnea     Comment:  OSA--USE C-PAP No date: Ulcer of left foot due to type 2 diabetes mellitus ([place]) No date: Urinary stress incontinence, male  Past Surgical History: 01/12/2017: AMPUTATION; Left     Comment:  Procedure: AMPUTATION BELOW KNEE;  Surgeon: Algernon Huxley, MD;  Location: [facility] ORS;  Service: General;                Laterality: Left; 10/15/2016: APLIGRAFT PLACEMENT; Left     Comment:  Procedure: APLIGRAFT PLACEMENT;  Surgeon: Albertine Patricia, DPM;  Location: [facility] OR[facility];  Service: Podiatry;                 Laterality: Left;  necrotic ulcer No date: CHOLECYSTECTOMY No date: EYE SURGERY; Bilateral     Comment:  Cataract Extraction with IOL 10/13/2018: HIP ARTHROPLASTY; Right     Comment:  Procedure: ARTHROPLASTY BIPOLAR HIP (HEMIARTHROPLASTY);               Surgeon: Earnestine Leys, MD;  Location: [facility];                Service: Orthopedics;  Laterality: Right; 10/15/2016: IRRIGATION AND DEBRIDEMENT FOOT; Left     Comment:  Procedure: IRRIGATION AND DEBRIDEMENT FOOT-EXCISIONAL               DEBRIDEMENT OF SKIN 3RD, 4TH AND 5TH TOES WITH               APPLICATION OF APLIGRAFT;  Surgeon: Albertine Patricia,               DPM;  Location: [facility];  Service: Podiatry;                Laterality: Left;  necrotic,gangrene 12/09/2016: IRRIGATION AND DEBRIDEMENT FOOT; Left     Comment:  Procedure: IRRIGATION AND DEBRIDEMENT               FOOT-MUSCLE/FASCIA, RESECTION OF FOURTH AND FIFTH               METATARSAL NECROTIC  BONE;  Surgeon: Albertine Patricia,               DPM;  Location: [facility];  Service: Podiatry;                Laterality: Left; 06/05/2018: LEFT HEART CATH AND CORONARY ANGIOGRAPHY; N/A     Comment:  Procedure: LEFT HEART CATH AND CORONARY ANGIOGRAPHY;                Surgeon: Yolonda Kida, MD;  Location: [place]              [facility];  Service: Cardiovascular;  Laterality: N/A; 08/16/2016: LOWER EXTREMITY ANGIOGRAPHY; Left     Comment:  Procedure: Lower Extremity Angiography;  Surgeon: Algernon Huxley, MD;  Location: [place] [facility];  Service:               Cardiovascular;  Laterality: Left; 09/01/2016: LOWER EXTREMITY ANGIOGRAPHY; Left     Comment:  Procedure: Lower Extremity Angiography;  Surgeon: Algernon Huxley, MD;  Location: [place] [facility];  Service:               Cardiovascular;  Laterality: Left; 10/14/2016: LOWER EXTREMITY ANGIOGRAPHY; Left     Comment:  Procedure: Lower Extremity Angiography;  Surgeon: Algernon Huxley, MD;  Location: [place] [facility];  Service:               Cardiovascular;  Laterality: Left; 12/20/2016: LOWER EXTREMITY ANGIOGRAPHY; Left     Comment:  Procedure: Lower Extremity Angiography;  Surgeon: Algernon Huxley, MD;  Location: [place] [facility];  Service:               Cardiovascular;  Laterality: Left; 12/18/2018: LOWER EXTREMITY ANGIOGRAPHY; Right     Comment:  Procedure: LOWER EXTREMITY ANGIOGRAPHY;  Surgeon: Algernon Huxley, MD;  Location: [place] [facility];  Service:               Cardiovascular;  Laterality: Right; 09/01/2016: LOWER EXTREMITY INTERVENTION     Comment:  Procedure: Lower Extremity Intervention;  Surgeon: Algernon Huxley, MD;  Location: [place] [facility];  Service:               Cardiovascular;; No date: PROSTATE SURGERY     Comment:  removal No date: TONSILLECTOMY     Comment:  as a child  BMI    Body Mass Index: 27.59 kg/m      Reproductive/Obstetrics negative OB ROS                           Anesthesia Physical Anesthesia Plan  ASA: III  Anesthesia Plan: General ETT   Post-op Pain Management:    Induction:   PONV Risk Score and Plan: Dexamethasone, Ondansetron and Treatment may vary due to age or medical condition  Airway Management Planned:   Additional Equipment:   Intra-op Plan:   Post-operative Plan:   Informed Consent:  I have reviewed the patients History and Physical, chart, labs and discussed the procedure including the risks, benefits and alternatives for the proposed anesthesia with the patient or authorized representative who has indicated his/her understanding and acceptance.     Dental Advisory Given  Plan Discussed with: Anesthesiologist and CRNA  Anesthesia Plan Comments: (Phone consent obtained via patient's wife on 12/23/18 at 0955.  KLF)       Anesthesia Quick Evaluation

## 2018-12-23 NOTE — Progress Notes (Signed)
[place] [facility] [place] NAME: Nakoa Ganus    MR#:  [id number]  DATE OF BIRTH:  1934/11/10  SUBJECTIVE:  CHIEF COMPLAINT:   Chief Complaint  Patient presents with  . Shortness of Breath   No new complaint this morning.  No fevers.  Patient was scheduled for debridement in the OR today. REVIEW OF SYSTEMS:  ROS Constitutional: Positive for malaise. Negative for chills and fever.  HENT: Negative for congestion, ear discharge, hearing loss and nosebleeds.   Eyes: Negative for blurred vision and double vision.  Respiratory: Negative for cough, shortness of breath and wheezing.   Cardiovascular: Negative for chest pain and palpitations.  Gastrointestinal: Negative for abdominal pain, constipation, diarrhea, nausea and vomiting.  Genitourinary: Negative for dysuria and urgency.  Musculoskeletal: Positive for joint pain. No muscle pains. Neurological: Negative for dizziness, focal weakness, seizures, weakness and headaches.  Psychiatric/Behavioral: Negative for depression.    DRUG ALLERGIES:   Allergies  Allergen Reactions  . Contrast Media [Iodinated Diagnostic Agents] Shortness Of Breath    SOB  . Iohexol Shortness Of Breath     Desc: Respiratory Distress, Laryngedema, diaphoresis, Onset Date: [id number]   . Metrizamide Shortness Of Breath    SOB SOB SOB   . Latex Rash   VITALS:  Blood pressure (!) 152/57, pulse 64, temperature 97.8 F (36.6 C), temperature source Oral, resp. rate 12, height 5\' 7"  (1.702 m), weight 79.9 kg, SpO2 98 %. PHYSICAL EXAMINATION:  Physical Exam  GENERAL:84 y.o.-year-old patient lying in the bed, feels miserable secondary to right foot pain EYES: Pupils equal, round, reactive to light and accommodation. No scleral icterus. Extraocular muscles intact.  HEENT: Head atraumatic, normocephalic. Oropharynx and nasopharynx clear.  NECK: Supple, no jugular venous distention. No thyroid enlargement, no  tenderness.  LUNGS: Normal breath sounds bilaterally,bibasilar crackles,no wheezing, rhonchi or crepitation. No use of accessory muscles of respiration.  CARDIOVASCULAR: S1, S2 normal. No rubs, or gallops. 3/6 systolic murmur present ABDOMEN: Soft, nontender, nondistended. Bowel sounds present. No organomegaly or mass.  EXTREMITIES:s/p left BKA and right foot swelling with a medial heel ulcer. Feeble DP pulses on right foot with dressing in place. NEUROLOGIC: Cranial nerves II through XII are intact. Muscle strength equal in all extremities. Sensation intact. Gait not checked.  PSYCHIATRIC: The patient is alert and oriented x 3 today  SKIN: No obvious rash, lesion.  Dressing in place to right foot.  LABORATORY PANEL:  Male CBC Recent Labs  Lab 12/23/18 0515  WBC 16.3*  HGB 10.6*  HCT 32.4*  PLT 296   ------------------------------------------------------------------------------------------------------------------ Chemistries  Recent Labs  Lab 12/20/18 0602  12/23/18 0515  NA 134*   < > 137  K 4.0   < > 4.2  CL 99   < > 100  CO2 23   < > 30  GLUCOSE 521*   < > 204*  BUN 28*   < > 33*  CREATININE 1.33*   < > 1.39*  CALCIUM 8.7*   < > 8.1*  MG  --    < > 2.0  AST 23  --   --   ALT 22  --   --   ALKPHOS 168*  --   --   BILITOT 1.2  --   --    < > = values in this interval not displayed.   RADIOLOGY:  No results found. ASSESSMENT AND PLAN:   [name] a84 y.o.malewith a known history of atrial fibrillation on  Xarelto, CAD, ischemic cardiomyopathy with EF of 35 to 40%, hypertension, peripheral vascular disease status post left BKA and right leg angioplasty 2 days ago, obstructive sleep apnea on CPAP presents to hospital secondary to confusion and worsening shortness of breath that started last night.  1. Acute hypoxic respiratory failure-secondary to acute pulmonary edema. -Acute on chronic systolic heart failure. Last cardiac cath was in January 2020  showing chronic occlusions and collaterals from LAD. Medical management recommended. EF was around 35%. -Patient was admitted on high flow nasal cannula, much improved with IV diuresis. -Renal function slowly improving  -Currently on 2 L oxygen which is acute.  Wean off oxygen as tolerated. Change Lasix to oral  2. sepsis-present on admission.  Secondary to Diabetic right heel ulcer-appears necrotic, ischemic ulcer. Status post recent revascularization and arterial stents placement on 12/18/2018. -Appreciate ID, podiatry and vascular consult. -Appreciate podiatry, ID and vascular consult. .  Blood cultures growing Proteus mirabilis.  Sensitivities pending.   Wound cultures so far growing Pseudomonas.  Patient is on Zosyn -MRI of the ankle -showing large wound in the posterior aspect of the heel, septic Achilles tendinopathy and possible abscess and pre-Achilles region with osteomyelitis of the calcaneus with cellulitis and mild fasciitis. -Podiatrist reevaluated patient with plans for debridement in the OR today and and attempts for limb salvage for now.  Heparin drip previously discontinued.  3. Uncontrolled diabetes mellitus- A1c is 8.9, sugars were significantly elevated on admission likely from sepsis. -Patient currently on Lantus and sliding scale insulin  4. A. fib-rate controlled. Continue cardiac medications. Patient on Cardizem, metoprolol and amiodarone -Xarelto on hold for procedure. Heparin drip discontinued due to plans for debridement in the OR today.  5. CKD stage III-stable. Continue to monitor  6. DVT prophylaxis-Heparin drip discontinued due to plans for debridement today.  To resume once okay with podiatrist  7.  Confusion-secondary to acute metabolic encephalopathy from underlying infection and pain medications. Improving .Has some sundowning in the evening   Will need PT consult after surgical procedure possibly tomorrow. Wheelchair-bound with walker  transfers at baseline Wife has been updated recently on treatment plans for today  All the records are reviewed and case discussed with Care Management/Social Worker. Management plans discussed with the patient, family and they are in agreement.  CODE STATUS: Full Code  TOTAL TIME TAKING CARE OF THIS PATIENT: 37 minutes.   More than 50% of the time was spent in counseling/coordination of care: YES  POSSIBLE D/C IN 3 DAYS, DEPENDING ON CLINICAL CONDITION.   Therin Vetsch M.D on 12/23/2018 at 1:05 PM  Between 7am to 6pm - Pager - [phone]  After 6pm go to [email or web address] - Proofreader  [facility]  Office  [phone]  CC: Primary care physician; Corey Skains, MD  Note: This dictation was prepared with Dragon dictation along with smaller phrase technology. Any transcriptional errors that result from this process are unintentional.

## 2018-12-23 NOTE — Anesthesia Procedure Notes (Signed)
Procedure Name: Intubation Date/Time: 12/23/2018 10:14 AM Performed by: Sherrine Maples, CRNA Pre-anesthesia Checklist: Patient identified, Patient being monitored, Timeout performed, Emergency Drugs available and Suction available Patient Re-evaluated:Patient Re-evaluated prior to induction Oxygen Delivery Method: Circle system utilized Preoxygenation: Pre-oxygenation with 100% oxygen Induction Type: IV induction Ventilation: Mask ventilation without difficulty Laryngoscope Size: Miller and 2 Grade View: Grade I Tube type: Oral Tube size: 7.5 mm Number of attempts: 1 Airway Equipment and Method: Stylet Placement Confirmation: ETT inserted through vocal cords under direct vision,  positive ETCO2 and breath sounds checked- equal and bilateral Secured at: 21 cm Tube secured with: Tape Dental Injury: Teeth and Oropharynx as per pre-operative assessment

## 2018-12-23 NOTE — Progress Notes (Signed)
Pharmacy Electrolyte Monitoring Consult:  Pharmacy consulted to assist in monitoring and replacing electrolytes in this 84 y.o. male admitted on 12/20/2018.  Labs:  Sodium (mmol/L)  Date Value  12/23/2018 137  01/20/2013 140   Potassium (mmol/L)  Date Value  12/23/2018 4.2  01/20/2013 3.5   Magnesium (mg/dL)  Date Value  12/23/2018 2.0   Phosphorus (mg/dL)  Date Value  12/23/2018 3.6   Calcium (mg/dL)  Date Value  12/23/2018 8.1 (L)   Calcium, Total (mg/dL)  Date Value  01/20/2013 9.4   Albumin (g/dL)  Date Value  12/22/2018 2.1 (L)  01/19/2013 3.7   Corrected Ca: 9.6  Assessment/Plan: Electrolytes: Pt is on amiodarone, lasix 20 mg PO daily.    Will replace for goal potassium ~ 4 and goal magnesium ~ 2.   No replacement needed today.   Will follow electrolytes with AM labs.  Glucose: Patient transitioned off insulin drip. Continued Lantus 12 units QHS. Continue Q4hr SSI. Patient is NPO for procedure. No adjustment needed today. Follow up tomorrow. May need to increase basal insulin. Home dose: lantus 15 units daily + humalog 3 units w/ meals.   Pharmacy will continue to monitor and adjust per consult.   Oswald Hillock, PharmD, BCPS 12/23/2018 8:00 AM

## 2018-12-23 NOTE — Anesthesia Postprocedure Evaluation (Signed)
Anesthesia Post Note  Patient: [name]  Procedure(s) Performed: IRRIGATION AND DEBRIDEMENT FOOT and wound vac application (Right )  Patient location during evaluation: PACU Anesthesia Type: General Level of consciousness: awake and alert Pain management: pain level controlled Vital Signs Assessment: post-procedure vital signs reviewed and stable Respiratory status: spontaneous breathing, nonlabored ventilation and respiratory function stable Cardiovascular status: blood pressure returned to baseline and stable Postop Assessment: no apparent nausea or vomiting Anesthetic complications: no     Last Vitals:  Vitals:   12/23/18 1322 12/23/18 2118  BP: (!) 154/62 (!) 137/52  Pulse: 66 75  Resp: 16   Temp: 36.8 C   SpO2: 100% 96%    Last Pain:  Vitals:   12/23/18 2118  TempSrc:   PainSc: [place]

## 2018-12-23 NOTE — Progress Notes (Signed)
Pt brought to OR See Clinical pre op pictures taken. Heel ulcer with purulence. Pressure ulcer medial ankle. Pressure wound to lateral 5th met base and 5th met head. Surrounding skin to heel ulcer is pre-necrotic as well.

## 2018-12-23 NOTE — Anesthesia Post-op Follow-up Note (Signed)
Anesthesia QCDR form completed.        

## 2018-12-23 NOTE — Op Note (Signed)
Operative note   Surgeon:Almadelia Looman Lawyer: None    Preop diagnosis: Necrosis with posterior right heel ulcer and infection.  Osteomyelitis right heel    Postop diagnosis: Same    Procedure:1. Excision posterior heel necrotic ulceration with debridement to bone. 2.  Biopsy posterior calcaneus    EBL: Minimal    Anesthesia:local and general.  Local consisted of a one-to-one mixture of 1% lidocaine with epinephrine and 0.5% bupivacaine.  A total of 20 cc was used    Hemostasis: Epinephrine infiltrated around the incision site    Specimen: Deep wound culture and bone culture as well as bone for pathology    Complications: None    Operative indications:[name] is an 84 y.o. that presents today for surgical intervention.  The risks/benefits/alternatives/complications have been discussed and consent has been given.    Procedure:  Patient was brought into the OR and placed on the operating table in theprone position. After anesthesia was obtained theright lower extremity was prepped and draped in usual sterile fashion.  Attention was directed to the posterior aspect of the heel where an 8-1/2 x 3-1/2 cm large necrotic ulceration was noted.  Full-thickness incision and excision of the necrotic tissue was performed.  The entire Achilles tendon at its attachment to the posterior calcaneus was necrotic infected with purulent drainage.  This was completely excised.  The deeper subcutaneous tissue was noted to be infected and this was flushed.  Deep culture of the wound was performed with a swab at this time.  There was noted to be a tract of infection coursing medially along the ankle region.  This was opened and drained.  Excisional debridement was performed with a versa jet down to bone and including bone.  After complete debridement of the entire surrounding soft tissue.  2 separate biopsies of bone were taken on the posterior aspect of calcaneus.  The first was taken with a  curette and sent for pathological examination.  The second was sent for bone culture.  All areas were flushed with copious amounts of irrigation.  A wound VAC was then applied to the posterior aspect of the heel with silver impregnated dressing.  There was noted to be a necrotic pressure induced ulceration on the medial aspect of the ankle that did not require debridement.  Also necrotic areas along the lateral fifth metatarsal phalangeal head and fifth metatarsal base on the right foot.  The surrounding soft tissue of the heel ulceration and periwound was noted to be pre-necrotic.  Mild bleeding was noted throughout the procedure.  No tourniquet was used.    Patient tolerated the procedure and anesthesia well.  Was transported from the OR to the PACU with all vital signs stable and vascular status intact.  Will return to the floor for further evaluation.

## 2018-12-24 ENCOUNTER — Encounter: Payer: Self-pay | Admitting: Podiatry

## 2018-12-24 LAB — BASIC METABOLIC PANEL
Anion gap: 10 (ref 5–15)
BUN: 32 mg/dL — ABNORMAL HIGH (ref 8–23)
CO2: 28 mmol/L (ref 22–32)
Calcium: 8.5 mg/dL — ABNORMAL LOW (ref 8.9–10.3)
Chloride: 99 mmol/L (ref 98–111)
Creatinine, Ser: 1.61 mg/dL — ABNORMAL HIGH (ref 0.61–1.24)
GFR calc Af Amer: 46 mL/min — ABNORMAL LOW (ref >60)
GFR calc non Af Amer: 40 mL/min — ABNORMAL LOW (ref >60)
Glucose, Bld: 190 mg/dL — ABNORMAL HIGH (ref 70–99)
Potassium: 4.2 mmol/L (ref 3.5–5.1)
Sodium: 137 mmol/L (ref 135–145)

## 2018-12-24 LAB — MAGNESIUM: Magnesium: 2.1 mg/dL (ref 1.7–2.4)

## 2018-12-24 LAB — GLUCOSE, CAPILLARY
Glucose-Capillary: 121 mg/dL — ABNORMAL HIGH (ref 70–99)
Glucose-Capillary: 159 mg/dL — ABNORMAL HIGH (ref 70–99)
Glucose-Capillary: 163 mg/dL — ABNORMAL HIGH (ref 70–99)
Glucose-Capillary: 189 mg/dL — ABNORMAL HIGH (ref 70–99)
Glucose-Capillary: 194 mg/dL — ABNORMAL HIGH (ref 70–99)
Glucose-Capillary: 201 mg/dL — ABNORMAL HIGH (ref 70–99)
Glucose-Capillary: 207 mg/dL — ABNORMAL HIGH (ref 70–99)
Glucose-Capillary: 255 mg/dL — ABNORMAL HIGH (ref 70–99)
Glucose-Capillary: 262 mg/dL — ABNORMAL HIGH (ref 70–99)
Glucose-Capillary: 278 mg/dL — ABNORMAL HIGH (ref 70–99)
Glucose-Capillary: 65 mg/dL — ABNORMAL LOW (ref 70–99)
Glucose-Capillary: 85 mg/dL (ref 70–99)

## 2018-12-24 LAB — CULTURE, BLOOD (ROUTINE X 2): Special Requests: ADEQUATE

## 2018-12-24 LAB — CBC
HCT: 38.8 % — ABNORMAL LOW (ref 39.0–52.0)
Hemoglobin: 12.4 g/dL — ABNORMAL LOW (ref 13.0–17.0)
MCH: 28.8 pg (ref 26.0–34.0)
MCHC: 32 g/dL (ref 30.0–36.0)
MCV: 90 fL (ref 80.0–100.0)
Platelets: 328 10*3/uL (ref 150–400)
RBC: 4.31 MIL/uL (ref 4.22–5.81)
RDW: 14.6 % (ref 11.5–15.5)
WBC: 18.8 10*3/uL — ABNORMAL HIGH (ref 4.0–10.5)
nRBC: 0 % (ref 0.0–0.2)

## 2018-12-24 LAB — HEPARIN LEVEL (UNFRACTIONATED)
Heparin Unfractionated: <0.1 IU/mL — ABNORMAL LOW (ref 0.30–0.70)
Heparin Unfractionated: 0.64 IU/mL (ref 0.30–0.70)

## 2018-12-24 MED ORDER — INSULIN GLARGINE 100 UNIT/ML ~~LOC~~ SOLN
8.0000 [IU] | Freq: Every day | SUBCUTANEOUS | Status: DC
  Administered 2018-12-24: 8 [IU] via SUBCUTANEOUS
  Filled 2018-12-24 (×2): qty 0.08

## 2018-12-24 MED ORDER — VANCOMYCIN VARIABLE DOSE PER UNSTABLE RENAL FUNCTION (PHARMACIST DOSING): Status: DC

## 2018-12-24 MED ORDER — VANCOMYCIN HCL 10 G IV SOLR
2000.0000 mg | Freq: Once | INTRAVENOUS | Status: AC
  Administered 2018-12-24: 2000 mg via INTRAVENOUS
  Filled 2018-12-24: qty 2000

## 2018-12-24 NOTE — Progress Notes (Signed)
Daily Progress Note   Subjective  - 1 Day Post-Op  F/u right heel debridement.  C/o some pain  Objective Vitals:   12/23/18 1322 12/23/18 2118 12/23/18 2250 12/24/18 0759  BP: (!) 154/62 (!) 137/52 (!) 119/47 (!) 154/60  Pulse: 66 75 67 73  Resp: 16  16 16   Temp: 98.2 F (36.8 C)  98 F (36.7 C) 99.8 F (37.7 C)  TempSrc: Oral  Oral Oral  SpO2: 100% 96% 97% 95%  Weight:      Height:        Physical Exam: Wound vac intact.  Good seal. Padding in place to medial and lateral ankle ulceration and early necrotic wounds to 5th metatarsal. See clinical pics.  Lateral foot    Medial ankle    Posterior heel prior to debridment     Laboratory CBC    Component Value Date/Time   WBC 18.8 (H) 12/24/2018 0537   HGB 12.4 (L) 12/24/2018 0537   HGB 16.3 01/20/2013 0255   HCT 38.8 (L) 12/24/2018 0537   HCT 47.9 01/20/2013 0255   PLT 328 12/24/2018 0537   PLT 245 01/20/2013 0255    BMET    Component Value Date/Time   NA 137 12/24/2018 0537   NA 140 01/20/2013 0255   K 4.2 12/24/2018 0537   K 3.5 01/20/2013 0255   CL 99 12/24/2018 0537   CL 103 01/20/2013 0255   CO2 28 12/24/2018 0537   CO2 30 01/20/2013 0255   GLUCOSE 190 (H) 12/24/2018 0537   GLUCOSE 145 (H) 01/20/2013 0255   BUN 32 (H) 12/24/2018 0537   BUN 22 (H) 01/20/2013 0255   CREATININE 1.61 (H) 12/24/2018 0537   CREATININE 1.09 01/20/2013 0255   CALCIUM 8.5 (L) 12/24/2018 0537   CALCIUM 9.4 01/20/2013 0255   GFRNONAA 40 (L) 12/24/2018 0537   GFRNONAA >60 01/20/2013 0255   GFRAA 46 (L) 12/24/2018 0537   GFRAA >60 01/20/2013 0255    Assessment/Planning: Necrosis with heel ulcers with osteomyelitis   Previous wound culture with Pseudomona and MRSA.  Blood cx with proteus   Wound vac intact.  Padding intact.  Will c/w wound vac for now.  Plan to change on Tuesday.  Poor outcome with BKA most likely.  WBC slightly increased today.    Samara Deist A  12/24/2018, 10:51 AM

## 2018-12-24 NOTE — Progress Notes (Signed)
Pharmacy Electrolyte Monitoring Consult:  Pharmacy consulted to assist in monitoring and replacing electrolytes in this 84 y.o. male admitted on 12/20/2018.  Labs:  Sodium (mmol/L)  Date Value  12/24/2018 137  01/20/2013 140   Potassium (mmol/L)  Date Value  12/24/2018 4.2  01/20/2013 3.5   Magnesium (mg/dL)  Date Value  12/24/2018 2.1   Phosphorus (mg/dL)  Date Value  12/23/2018 3.6   Calcium (mg/dL)  Date Value  12/24/2018 8.5 (L)   Calcium, Total (mg/dL)  Date Value  01/20/2013 9.4   Albumin (g/dL)  Date Value  12/22/2018 2.1 (L)  01/19/2013 3.7   Corrected Ca: 9.6  Assessment/Plan: Electrolytes: Pt is on amiodarone, lasix 20 mg PO daily.    Will replace for goal potassium ~ 4 and goal magnesium ~ 2.   No replacement needed today.   Will follow electrolytes with AM labs.  Glucose: Patient transitioned off insulin drip. Continued Lantus 12 units QHS. Continue Q4hr SSI. Patient was NPO for procedure, starting diet today. No adjustment needed today. Follow up tomorrow. May need to increase basal insulin. Home dose: lantus 15 units daily + humalog 3 units w/ meals.   Pharmacy will continue to monitor and adjust per consult.   Oswald Hillock, PharmD, BCPS 12/24/2018 7:55 AM

## 2018-12-24 NOTE — Consult Note (Signed)
Pharmacy Antibiotic Note  [name] is a 84 y.o. male admitted on 12/20/2018 with Wound infection growing PSA and MRSA. Marland Kitchen  Pharmacy has been consulted for pip/tazo and vancomycin dosing.  Plan: Will give a loading dose of vancomycin 2000 mg x 1. Creatinine is unstable will dose by levels.   Will continue pip/tazo 3.375 mg q8H.   Height: 5\' 7"  (170.2 cm) Weight: 176 lb 2.4 oz (79.9 kg) IBW/kg (Calculated) : 66.1  Temp (24hrs), Avg:98.9 F (37.2 C), Min:97.8 F (36.6 C), Max:99.9 F (37.7 C)  Recent Labs  Lab 12/20/18 0602 12/20/18 1137 12/21/18 0054 12/22/18 0402 12/23/18 0515 12/24/18 0537  WBC 28.3*  --  22.8* 15.6* 16.3* 18.8*  CREATININE 1.33*  --  1.32* 1.46* 1.39* 1.61*  LATICACIDVEN 2.8* 2.4*  --   --   --   --     Estimated Creatinine Clearance: 36.4 mL/min (A) (by C-G formula based on SCr of 1.61 mg/dL (H)).    Allergies  Allergen Reactions  . Contrast Media [Iodinated Diagnostic Agents] Shortness Of Breath    SOB  . Iohexol Shortness Of Breath     Desc: Respiratory Distress, Laryngedema, diaphoresis, Onset Date: [id number]   . Metrizamide Shortness Of Breath    SOB SOB SOB   . Latex Rash    Antimicrobials this admission: 8/2 vancomycin >>  7/29 pip/tazo >>   Dose adjustments this admission: None  Microbiology results: 7/29 Wound cx: PSA and MRSA sensitive to pip/tazo and vancomycin  7/29 1/4 BCx: Proteus mirabilis sensitive to pip/tazo   Thank you for allowing pharmacy to be a part of this patient's care.  Oswald Hillock, PharmD, BCPS 12/24/2018 11:17 AM

## 2018-12-24 NOTE — Progress Notes (Signed)
Pts BP 107/45 HR 51, MD Ojie notified and made aware. No orders received.

## 2018-12-24 NOTE — Progress Notes (Signed)
[place] [facility] [place] NAME: [name]    MR#:  [id number]  DATE OF BIRTH:  Apr 25, 1935  SUBJECTIVE:  CHIEF COMPLAINT:   Chief Complaint  Patient presents with  . Shortness of Breath   Patient reported to be more sleepy this morning.  Blood sugars okay this morning.Marland Kitchen  Responds to verbal command however.  REVIEW OF SYSTEMS:  ROS unobtainable this morning due to patient being very sleepy.  DRUG ALLERGIES:   Allergies  Allergen Reactions  . Contrast Media [Iodinated Diagnostic Agents] Shortness Of Breath    SOB  . Iohexol Shortness Of Breath     Desc: Respiratory Distress, Laryngedema, diaphoresis, Onset Date: [id number]   . Metrizamide Shortness Of Breath    SOB SOB SOB   . Latex Rash   VITALS:  Blood pressure (!) 154/69, pulse 71, temperature 99.8 F (37.7 C), temperature source Oral, resp. rate 16, height 5\' 7"  (1.702 m), weight 79.9 kg, SpO2 99 %. PHYSICAL EXAMINATION:  Physical Exam  GENERAL:84 y.o.-year-old patient lying in the bed, feels miserable secondary to right foot pain EYES: Pupils equal, round, reactive to light and accommodation. No scleral icterus. Extraocular muscles intact.  HEENT: Head atraumatic, normocephalic. Oropharynx and nasopharynx clear.  NECK: Supple, no jugular venous distention. No thyroid enlargement, no tenderness.  LUNGS: Normal breath sounds bilaterally,bibasilar crackles,no wheezing, rhonchi or crepitation. No use of accessory muscles of respiration.  CARDIOVASCULAR: S1, S2 normal. No rubs, or gallops. 3/6 systolic murmur present ABDOMEN: Soft, nontender, nondistended. Bowel sounds present. No organomegaly or mass.  EXTREMITIES:s/p left BKA and right foot swelling with a medial heel ulcer. Feeble DP pulses on right foot with dressing in place. NEUROLOGIC: Patient somewhat very sleepy this morning.  Full neuro exam not done. PSYCHIATRIC: Patient more sleepy this morning.  Responds  easily to verbal command however. SKIN: No obvious rash, lesion.  Dressing in place to right foot.  LABORATORY PANEL:  Male CBC Recent Labs  Lab 12/24/18 0537  WBC 18.8*  HGB 12.4*  HCT 38.8*  PLT 328   ------------------------------------------------------------------------------------------------------------------ Chemistries  Recent Labs  Lab 12/20/18 0602  12/24/18 0537  NA 134*   < > 137  K 4.0   < > 4.2  CL 99   < > 99  CO2 23   < > 28  GLUCOSE 521*   < > 190*  BUN 28*   < > 32*  CREATININE 1.33*   < > 1.61*  CALCIUM 8.7*   < > 8.5*  MG  --    < > 2.1  AST 23  --   --   ALT 22  --   --   ALKPHOS 168*  --   --   BILITOT 1.2  --   --    < > = values in this interval not displayed.   RADIOLOGY:  No results found. ASSESSMENT AND PLAN:   [name] a83 y.o.malewith a known history of atrial fibrillation on Xarelto, CAD, ischemic cardiomyopathy with EF of 35 to 40%, hypertension, peripheral vascular disease status post left BKA and right leg angioplasty 2 days ago, obstructive sleep apnea on CPAP presents to hospital secondary to confusion and worsening shortness of breath that started last night.  1. Acute hypoxic respiratory failure-secondary to acute pulmonary edema. -Acute on chronic systolic heart failure. Last cardiac cath was in January 2020 showing chronic occlusions and collaterals from LAD. Medical management recommended. EF was around 35%. -Patient was admitted on  high flow nasal cannula, much improved with IV diuresis. -Renal function slowly improving  -Currently on 2 L oxygen which is acute.  Wean off oxygen as tolerated. Change Lasix to oral  2. sepsis-present on admission.  Secondary to Diabetic right heel ulcer-appears necrotic, ischemic ulcer. Status post recent revascularization and arterial stents placement on 12/18/2018. -Appreciate ID, podiatry and vascular consult. -Appreciate podiatry, ID and vascular consult. .  Blood cultures  growing Proteus mirabilis.  Wound cultures so far growing Pseudomonas and MRSA.  Patient is on Zosyn.  IV vancomycin added with pharmacy to assist with dosing  -MRI of the ankle -showing large wound in the posterior aspect of the heel, septic Achilles tendinopathy and possible abscess and pre-Achilles region with osteomyelitis of the calcaneus with cellulitis and mild fasciitis. Patient status post debridement in the OR on 12/23/2018.  Plan is to continue with wound VAC for now.  To change wound VAC on Tuesday. Heparin drip resumed  3. Uncontrolled diabetes mellitus- A1c is 8.9,  Patient said to have had episode of hypoglycemia last night.  Blood sugar 190 this morning.  Decreased dose of Lantus insulin from 12 to 8 units.  Continue sliding scale insulin coverage and monitor.  4. A. fib-rate controlled. Continue cardiac medications. Patient on Cardizem, metoprolol and amiodarone -Xarelto on hold for procedure. Heparin drip resumed today following surgery yesterday   5. CKD stage III-stable. Continue to monitor  6. Acute kidney injury. Held off on home dose of Lasix.  Avoiding IV fluids since patient developed recent pulmonary edema several days ago due to underlying chronic systolic CHF.  Follow-up on renal function in a.m.  7.  Confusion-secondary to acute metabolic encephalopathy from underlying infection and pain medications. Improving .Has some sundowning in the evening  DVT prophylaxis-patient already on Heparin drip   We will get physical therapy to evaluate patient when more awake and alert. Wheelchair-bound with walker transfers at baseline  All the records are reviewed and case discussed with Care Management/Social Worker. Management plans discussed with the patient, family and they are in agreement. I called patient's wife this morning and updated her on treatment plans.  All questions were answered.    CODE STATUS: Full Code  TOTAL TIME TAKING CARE OF THIS  PATIENT: 35 minutes.   More than 50% of the time was spent in counseling/coordination of care: YES  POSSIBLE D/C IN 3 DAYS, DEPENDING ON CLINICAL CONDITION.   [name] M.D on 12/24/2018 at 12:49 PM  Between 7am to 6pm - Pager - [phone]  After 6pm go to [email or web address] - Proofreader  [facility]  Office  [phone]  CC: Primary care physician; Corey Skains, MD  Note: This dictation was prepared with Dragon dictation along with smaller phrase technology. Any transcriptional errors that result from this process are unintentional.

## 2018-12-24 NOTE — Progress Notes (Signed)
ANTICOAGULATION CONSULT NOTE  Pharmacy Consult for Heparin Drip Management  Indication: atrial fibrillation  Patient Measurements: Height: 5\' 7"  (170.2 cm) Weight: 176 lb 2.4 oz (79.9 kg) IBW/kg (Calculated) : 66.1  Vital Signs: Temp: 98.4 F (36.9 C) (08/02 1628) Temp Source: Oral (08/02 1628) BP: 112/50 (08/02 1736) Pulse Rate: 53 (08/02 1736)  Labs: Recent Labs    12/22/18 0402 12/22/18 1221 12/23/18 0515 12/24/18 0537 12/24/18 1813  HGB 9.3*  --  10.6* 12.4*  --   HCT 28.8*  --  32.4* 38.8*  --   PLT 277  --  296 328  --   APTT 83* 71* 26  --   --   HEPARINUNFRC 0.80*  --   --  <0.10* 0.64  CREATININE 1.46*  --  1.39* 1.61*  --     Estimated Creatinine Clearance: 36.4 mL/min (A) (by C-G formula based on SCr of 1.61 mg/dL (H)).   Medications:  Scheduled:  . amiodarone  200 mg Oral Daily  . aspirin EC  81 mg Oral Daily  . atorvastatin  80 mg Oral QPM  . Chlorhexidine Gluconate Cloth  6 each Topical Daily  . diltiazem  120 mg Oral Daily  . ferrous sulfate  325 mg Oral Q breakfast  . gabapentin  200 mg Oral TID  . insulin aspart  0-15 Units Subcutaneous Q4H  . insulin glargine  8 Units Subcutaneous QHS  . mouth rinse  15 mL Mouth Rinse BID  . metoprolol tartrate  25 mg Oral TID  . vancomycin variable dose per unstable renal function (pharmacist dosing)   Does not apply See admin instructions   Infusions:  . heparin 1,400 Units/hr (12/24/18 1033)  . piperacillin-tazobactam (ZOSYN)  IV 3.375 g (12/24/18 1524)    Assessment: Pharmacy consulted for heparin drip management for 83 yo male admitted on 7/29 with right heel ulcer and confusion. Patient with angioplasty on 7/27. Patient takes rivaroxaban 15mg  with supper. Last dose 7/28 at 1700. Patient with history significant for atrial fibrillation, CHF, CKD, diabetes, hypertension, hyperlipidemia, left BKA.   Goal of Therapy:  Heparin level 0.3-0.7 units/ml aPTT 66-102 seconds Monitor platelets by  anticoagulation protocol: Yes  Heparin Course: 7/29 PM initiation: drip initated at 1200 units/hr (no bolus) 7/30 0100 aPTT 41s: 1200 units/h 7/30 0630 aPTT 58s: inc rate to 1300 units/hr 7/30 1721 aPTT 52s: inc rate to 1400 units/hr 7/31 0400 aPTT 83 seconds therapeutic, HL 0.80 supratherapeutic and not correlating. 7/31 1221 aPTT 71s: Therapeutic x2.  7/31 1857 Heparin stopped  (Restarted 8/2@1034 ) 8/2   1813 HL 0.64 therapeutic    Plan:  Level is currently therapeutic. Will continue current rate of 1400 units/hr and will recheck HL in 8 hours CBC w/ am labs., CBC seems to be stable. Continue to monitor.   Pearla Dubonnet, PharmD Clinical Pharmacist 12/24/2018 6:34 PM

## 2018-12-24 NOTE — Plan of Care (Signed)
Patient BG was checked late after power outage BG 65 juice and snack given. [employer] notified, BG closely monitored and reported.    Problem: Education: Goal: Knowledge of General Education information will improve Description: Including pain rating scale, medication(s)/side effects and non-pharmacologic comfort measures Outcome: Progressing   Problem: Health Behavior/Discharge Planning: Goal: Ability to manage health-related needs will improve Outcome: Progressing   Problem: Clinical Measurements: Goal: Ability to maintain clinical measurements within normal limits will improve Outcome: Progressing Goal: Will remain free from infection Outcome: Progressing Goal: Diagnostic test results will improve Outcome: Progressing Goal: Respiratory complications will improve Outcome: Progressing Goal: Cardiovascular complication will be avoided Outcome: Progressing   Problem: Activity: Goal: Risk for activity intolerance will decrease Outcome: Progressing   Problem: Nutrition: Goal: Adequate nutrition will be maintained Outcome: Progressing   Problem: Coping: Goal: Level of anxiety will decrease Outcome: Progressing   Problem: Elimination: Goal: Will not experience complications related to bowel motility Outcome: Progressing Goal: Will not experience complications related to urinary retention Outcome: Progressing   Problem: Pain Managment: Goal: General experience of comfort will improve Outcome: Progressing   Problem: Safety: Goal: Ability to remain free from injury will improve Outcome: Progressing   Problem: Skin Integrity: Goal: Risk for impaired skin integrity will decrease Outcome: Progressing

## 2018-12-25 LAB — BASIC METABOLIC PANEL
Anion gap: 9 (ref 5–15)
BUN: 33 mg/dL — ABNORMAL HIGH (ref 8–23)
CO2: 29 mmol/L (ref 22–32)
Calcium: 7.9 mg/dL — ABNORMAL LOW (ref 8.9–10.3)
Chloride: 102 mmol/L (ref 98–111)
Creatinine, Ser: 1.53 mg/dL — ABNORMAL HIGH (ref 0.61–1.24)
GFR calc Af Amer: 49 mL/min — ABNORMAL LOW (ref >60)
GFR calc non Af Amer: 42 mL/min — ABNORMAL LOW (ref >60)
Glucose, Bld: 114 mg/dL — ABNORMAL HIGH (ref 70–99)
Potassium: 3.9 mmol/L (ref 3.5–5.1)
Sodium: 140 mmol/L (ref 135–145)

## 2018-12-25 LAB — AEROBIC/ANAEROBIC CULTURE W GRAM STAIN (SURGICAL/DEEP WOUND): Special Requests: NORMAL

## 2018-12-25 LAB — CULTURE, BLOOD (ROUTINE X 2)
Culture: NO GROWTH
Culture: NO GROWTH
Culture: NO GROWTH
Special Requests: ADEQUATE
Special Requests: ADEQUATE

## 2018-12-25 LAB — CBC
HCT: 28.6 % — ABNORMAL LOW (ref 39.0–52.0)
Hemoglobin: 8.9 g/dL — ABNORMAL LOW (ref 13.0–17.0)
MCH: 28.6 pg (ref 26.0–34.0)
MCHC: 31.1 g/dL (ref 30.0–36.0)
MCV: 92 fL (ref 80.0–100.0)
Platelets: 284 10*3/uL (ref 150–400)
RBC: 3.11 MIL/uL — ABNORMAL LOW (ref 4.22–5.81)
RDW: 14.8 % (ref 11.5–15.5)
WBC: 19.1 10*3/uL — ABNORMAL HIGH (ref 4.0–10.5)
nRBC: 0 % (ref 0.0–0.2)

## 2018-12-25 LAB — GLUCOSE, CAPILLARY
Glucose-Capillary: 190 mg/dL — ABNORMAL HIGH (ref 70–99)
Glucose-Capillary: 244 mg/dL — ABNORMAL HIGH (ref 70–99)
Glucose-Capillary: 267 mg/dL — ABNORMAL HIGH (ref 70–99)
Glucose-Capillary: 271 mg/dL — ABNORMAL HIGH (ref 70–99)
Glucose-Capillary: 285 mg/dL — ABNORMAL HIGH (ref 70–99)
Glucose-Capillary: 331 mg/dL — ABNORMAL HIGH (ref 70–99)
Glucose-Capillary: 87 mg/dL (ref 70–99)

## 2018-12-25 LAB — MAGNESIUM: Magnesium: 2.1 mg/dL (ref 1.7–2.4)

## 2018-12-25 LAB — HEPARIN LEVEL (UNFRACTIONATED)
Heparin Unfractionated: 0.81 IU/mL — ABNORMAL HIGH (ref 0.30–0.70)
Heparin Unfractionated: 0.38 IU/mL (ref 0.30–0.70)
Heparin Unfractionated: 0.34 IU/mL (ref 0.30–0.70)

## 2018-12-25 LAB — VANCOMYCIN, RANDOM: Vancomycin Rm: 10

## 2018-12-25 MED ORDER — GABAPENTIN 300 MG PO CAPS
300.0000 mg | ORAL_CAPSULE | Freq: Three times a day (TID) | ORAL | Status: DC
  Administered 2018-12-25 – 2019-01-04 (×26): 300 mg via ORAL
  Filled 2018-12-25 (×27): qty 1

## 2018-12-25 MED ORDER — VANCOMYCIN HCL IN DEXTROSE 1-5 GM/200ML-% IV SOLN
1000.0000 mg | INTRAVENOUS | Status: DC
  Administered 2018-12-25 – 2018-12-26 (×2): 1000 mg via INTRAVENOUS
  Filled 2018-12-25 (×4): qty 200

## 2018-12-25 MED ORDER — POLYETHYLENE GLYCOL 3350 17 G PO PACK
17.0000 g | PACK | Freq: Every day | ORAL | Status: DC
  Administered 2018-12-25 – 2019-01-03 (×8): 17 g via ORAL
  Filled 2018-12-25 (×8): qty 1

## 2018-12-25 MED ORDER — INSULIN GLARGINE 100 UNIT/ML ~~LOC~~ SOLN
12.0000 [IU] | Freq: Every day | SUBCUTANEOUS | Status: DC
  Administered 2018-12-25 – 2019-01-03 (×9): 12 [IU] via SUBCUTANEOUS
  Filled 2018-12-25 (×12): qty 0.12

## 2018-12-25 NOTE — Progress Notes (Addendum)
Gardiner Barefoot and Dr Sidney Ace aware of patient B/P and HR. Ok to [employer] Metoprolol Per [employer].

## 2018-12-25 NOTE — Progress Notes (Signed)
Inpatient Diabetes Program Recommendations  AACE/ADA: New Consensus Statement on Inpatient Glycemic Control   Target Ranges:  Prepandial:   less than 140 mg/dL      Peak postprandial:   less than 180 mg/dL (1-2 hours)      Critically ill patients:  140 - 180 mg/dL   Results for ORI, TREJOS (MRN [id number]) as of 12/25/2018 12:04  Ref. Range 12/24/2018 08:00 12/24/2018 11:45 12/24/2018 16:07 12/24/2018 17:45 12/24/2018 20:05 12/25/2018 00:16 12/25/2018 03:48 12/25/2018 07:56 12/25/2018 11:51  Glucose-Capillary Latest Ref Range: 70 - 99 mg/dL 159 (H) 163 (H) 262 (H) 278 (H) 255 (H) 271 (H) 87 285 (H) 267 (H)   Review of Glycemic Control  Diabetes history:DM2 Outpatient Diabetes medications:Lantus 15 units QHS, Humalog 3 units with meals plus 1 unit for every 50 mg/dl above target glucose of 200 mg/dl Current orders for Inpatient glycemic control: Lantus 8 units daily, Novolog 0-15 units Q4H  Insulin-Basal: Please consider increasing Lantus to 12 units daily at bedtime.  Insulin-Meal Coverage: Please consider ordering Novolog 3 units TID with meals for meal coverage if patient eats at least 50% of meals.  Thanks, Barnie Alderman, RN, MSN, CDE Diabetes Coordinator Inpatient Diabetes Program [phone] (Team Pager from 8am to 5pm)

## 2018-12-25 NOTE — Progress Notes (Addendum)
Pharmacy Antibiotic Note  [name] is a 84 y.o. male admitted on 12/20/2018 with wound infection growing MRSA and Pseudomonas.  Blood cultures notable for Proteus mirabilis.  On 8/1, he underwent R heel ulcer debridement, and on 7/27 had an angioplasty with stent.  Pharmacy has been consulted for vancomycin dosing.  He is also on Zosyn, and recently received a vanc loading dose of 2000 mg x 1 on 8/2.   Plan: VR (8/3) subtherapeutic at 10.  As VR is <20, maintenance dosing will be started as indicated below:  Vancomycin 1000 mg IV Q 24 hrs. Goal AUC 400-550. Expected AUC: 514.5 SCr used: 1.53 Expected Cmin: 14.4  Height: 5\' 7"  (170.2 cm) Weight: 176 lb 2.4 oz (79.9 kg) IBW/kg (Calculated) : 66.1  Temp (24hrs), Avg:98.2 F (36.8 C), Min:98 F (36.7 C), Max:98.4 F (36.9 C)  Recent Labs  Lab 12/20/18 0602 12/20/18 1137 12/21/18 0054 12/22/18 0402 12/23/18 0515 12/24/18 0537 12/25/18 0213 12/25/18 1128  WBC 28.3*  --  22.8* 15.6* 16.3* 18.8* 19.1*  --   CREATININE 1.33*  --  1.32* 1.46* 1.39* 1.61* 1.53*  --   LATICACIDVEN 2.8* 2.4*  --   --   --   --   --   --   VANCORANDOM  --   --   --   --   --   --   --  10    Estimated Creatinine Clearance: 38.3 mL/min (A) (by C-G formula based on SCr of 1.53 mg/dL (H)).    Allergies  Allergen Reactions  . Contrast Media [Iodinated Diagnostic Agents] Shortness Of Breath    SOB  . Iohexol Shortness Of Breath     Desc: Respiratory Distress, Laryngedema, diaphoresis, Onset Date: [id number]   . Metrizamide Shortness Of Breath    SOB SOB SOB   . Latex Rash    Antimicrobials this admission: Zosyn (7/29) >>  Vanc (8/2) >>   Dose adjustments this admission: None  Microbiology results: 7/29 Wound cx: PSA and MRSA sensitive to pip/tazo and vancomycin  7/29 1/4 BCx: Proteus mirabilis sensitive to pip/tazo   Thank you for allowing pharmacy to be a part of this patient's care.  Gerald Dexter, PharmD Pharmacy  Resident  12/25/2018 2:53 PM

## 2018-12-25 NOTE — Progress Notes (Signed)
[facility] [place] NAME: [name]    MR#:  [id number]  DATE OF BIRTH:  08/05/1934  SUBJECTIVE:  CHIEF COMPLAINT:   Chief Complaint  Patient presents with  . Shortness of Breath   Patient awake and alert and oriented this morning.  Given some pain medications already this morning due to complaints of pain.  No fevers.  REVIEW OF SYSTEMS:  Review of Systems  Constitutional: Negative for chills and fever.  HENT: Negative for hearing loss and tinnitus.   Eyes: Negative for blurred vision and double vision.  Respiratory: Negative for cough and shortness of breath.   Cardiovascular: Negative for chest pain and palpitations.  Gastrointestinal: Negative for abdominal pain, heartburn, nausea and vomiting.  Genitourinary: Negative for dysuria and urgency.  Musculoskeletal: Negative for neck pain.       Pain at site of recent surgical debridement.  Skin: Negative for itching and rash.  Neurological: Negative for dizziness and headaches.  Psychiatric/Behavioral: Negative for depression and hallucinations.      DRUG ALLERGIES:   Allergies  Allergen Reactions  . Contrast Media [Iodinated Diagnostic Agents] Shortness Of Breath    SOB  . Iohexol Shortness Of Breath     Desc: Respiratory Distress, Laryngedema, diaphoresis, Onset Date: [id number]   . Metrizamide Shortness Of Breath    SOB SOB SOB   . Latex Rash   VITALS:  Blood pressure 131/66, pulse (!) 51, temperature 98.2 F (36.8 C), resp. rate 18, height 5\' 7"  (1.702 m), weight 79.9 kg, SpO2 99 %. PHYSICAL EXAMINATION:  Physical Exam  GENERAL:84 y.o.-year-old patient lying in the bed, feels miserable secondary to right foot pain EYES: Pupils equal, round, reactive to light and accommodation. No scleral icterus. Extraocular muscles intact.  HEENT: Head atraumatic, normocephalic. Oropharynx and nasopharynx clear.  NECK: Supple, no jugular venous distention. No thyroid  enlargement, no tenderness.  LUNGS: Normal breath sounds bilaterally,bibasilar crackles,no wheezing, rhonchi or crepitation. No use of accessory muscles of respiration.  CARDIOVASCULAR: S1, S2 normal. No rubs, or gallops. 3/6 systolic murmur present ABDOMEN: Soft, nontender, nondistended. Bowel sounds present. No organomegaly or mass.  EXTREMITIES:s/p left BKA and right foot swelling with a medial heel ulcer.  Wound VAC in place to right foot. NEUROLOGIC: Awake and alert and oriented.  No focal deficit.   PSYCHIATRIC: Patient awake and alert and oriented.  Normal mood.   SKIN: No obvious rash, lesion.  Dressing in place to right foot.  LABORATORY PANEL:  Male CBC Recent Labs  Lab 12/25/18 0213  WBC 19.1*  HGB 8.9*  HCT 28.6*  PLT 284   ------------------------------------------------------------------------------------------------------------------ Chemistries  Recent Labs  Lab 12/20/18 0602  12/25/18 0213  NA 134*   < > 140  K 4.0   < > 3.9  CL 99   < > 102  CO2 23   < > 29  GLUCOSE 521*   < > 114*  BUN 28*   < > 33*  CREATININE 1.33*   < > 1.53*  CALCIUM 8.7*   < > 7.9*  MG  --    < > 2.1  AST 23  --   --   ALT 22  --   --   ALKPHOS 168*  --   --   BILITOT 1.2  --   --    < > = values in this interval not displayed.   RADIOLOGY:  No results found. ASSESSMENT AND PLAN:   [name] a83  y.o.malewith a known history of atrial fibrillation on Xarelto, CAD, ischemic cardiomyopathy with EF of 35 to 40%, hypertension, peripheral vascular disease status post left BKA and right leg angioplasty 2 days ago, obstructive sleep apnea on CPAP presents to hospital secondary to confusion and worsening shortness of breath   1. Acute hypoxic respiratory failure-secondary to acute pulmonary edema. -Acute on chronic systolic heart failure. Last cardiac cath was in January 2020 showing chronic occlusions and collaterals from LAD. Medical management recommended. EF was  around 35%. -Patient was admitted on high flow nasal cannula, much improved with IV diuresis. -Renal function slowly improving  -Currently on 2 L oxygen which is acute.  Wean off oxygen as tolerated. Change Lasix to oral  2. sepsis-present on admission.  Secondary to Diabetic right heel ulcer-appears necrotic, ischemic ulcer. Status post recent revascularization and arterial stents placement on 12/18/2018. -Appreciate ID, podiatry and vascular consult. -Appreciate podiatry, ID and vascular consult. .  Blood cultures growing Proteus mirabilis.  Wound cultures so far growing Pseudomonas and MRSA.  Patient is on Zosyn.  IV vancomycin added with pharmacy to assist with dosing  -MRI of the ankle -showing large wound in the posterior aspect of the heel, septic Achilles tendinopathy and possible abscess and pre-Achilles region with osteomyelitis of the calcaneus with cellulitis and mild fasciitis. Patient status post debridement in the OR on 12/23/2018.  Plan is to continue with wound VAC for now.  To change wound VAC on Tuesday. Heparin drip resumed  3. Uncontrolled diabetes mellitus- A1c is 8.9,  Patient said to have had episode of hypoglycemia recently and Lantus insulin decreased from 12 to 8 units nightly to prevent recurrent hypoglycemia.  Continue sliding scale insulin coverage and monitor.  4. A. fib-rate controlled. Continue cardiac medications. Patient on Cardizem, metoprolol and amiodarone -Xarelto on hold for procedure. Heparin drip resumed following surgery    5. CKD stage III-stable. Continue to monitor  6. Acute kidney injury. Held off on home dose of Lasix due to recent worsening of renal function.  Noted improvement in renal function this morning.  7.  Confusion-secondary to acute metabolic encephalopathy from underlying infection and pain medications. Significantly improved.  Patient awake and alert and oriented this morning.  DVT prophylaxis-patient already on  Heparin drip   We will get physical therapy to evaluate patient when more awake and alert. Wheelchair-bound with walker transfers at baseline  All the records are reviewed and case discussed with Care Management/Social Worker. Management plans discussed with the patient, family and they are in agreement. I called patient's wife recently and updated her on treatment plans.  All questions were answered.    CODE STATUS: Full Code  TOTAL TIME TAKING CARE OF THIS PATIENT: 33 minutes.   More than 50% of the time was spent in counseling/coordination of care: YES  POSSIBLE D/C IN 3 DAYS, DEPENDING ON CLINICAL CONDITION.   [name] M.D on 12/25/2018 at 11:59 AM  Between 7am to 6pm - Pager - [phone]  After 6pm go to [email or web address] - Proofreader  [facility]  Office  [phone]  CC: Primary care physician; Corey Skains, MD  Note: This dictation was prepared with Dragon dictation along with smaller phrase technology. Any transcriptional errors that result from this process are unintentional.

## 2018-12-25 NOTE — Plan of Care (Signed)
Patient drowsy and sleeping all shift. Patient did not want to eat or drink. NP and MD made aware for patient B/P and HR tonight.  Problem: Education: Goal: Knowledge of General Education information will improve Description: Including pain rating scale, medication(s)/side effects and non-pharmacologic comfort measures Outcome: Progressing   Problem: Health Behavior/Discharge Planning: Goal: Ability to manage health-related needs will improve Outcome: Progressing   Problem: Clinical Measurements: Goal: Ability to maintain clinical measurements within normal limits will improve Outcome: Progressing Goal: Will remain free from infection Outcome: Progressing Goal: Diagnostic test results will improve Outcome: Progressing Goal: Respiratory complications will improve Outcome: Progressing Goal: Cardiovascular complication will be avoided Outcome: Progressing   Problem: Activity: Goal: Risk for activity intolerance will decrease Outcome: Progressing   Problem: Nutrition: Goal: Adequate nutrition will be maintained Outcome: Progressing   Problem: Coping: Goal: Level of anxiety will decrease Outcome: Progressing   Problem: Elimination: Goal: Will not experience complications related to bowel motility Outcome: Progressing Goal: Will not experience complications related to urinary retention Outcome: Progressing   Problem: Pain Managment: Goal: General experience of comfort will improve Outcome: Progressing   Problem: Safety: Goal: Ability to remain free from injury will improve Outcome: Progressing   Problem: Skin Integrity: Goal: Risk for impaired skin integrity will decrease Outcome: Progressing

## 2018-12-25 NOTE — Progress Notes (Signed)
ANTICOAGULATION CONSULT NOTE  Pharmacy Consult for Heparin Drip Management  Indication: atrial fibrillation  Patient Measurements: Height: 5\' 7"  (170.2 cm) Weight: 176 lb 2.4 oz (79.9 kg) IBW/kg (Calculated) : 66.1  Vital Signs: Temp: 98 F (36.7 C) (08/02 2255) Temp Source: Oral (08/02 1628) BP: 112/46 (08/02 2255) Pulse Rate: 48 (08/02 2255)  Labs: Recent Labs    12/22/18 0402 12/22/18 1221 12/23/18 0515 12/24/18 0537 12/24/18 1813 12/25/18 0213  HGB 9.3*  --  10.6* 12.4*  --  8.9*  HCT 28.8*  --  32.4* 38.8*  --  28.6*  PLT 277  --  296 328  --  284  APTT 83* 71* 26  --   --   --   HEPARINUNFRC 0.80*  --   --  <0.10* 0.64 0.81*  CREATININE 1.46*  --  1.39* 1.61*  --  1.53*    Estimated Creatinine Clearance: 38.3 mL/min (A) (by C-G formula based on SCr of 1.53 mg/dL (H)).   Medications:  Scheduled:  . amiodarone  200 mg Oral Daily  . aspirin EC  81 mg Oral Daily  . atorvastatin  80 mg Oral QPM  . Chlorhexidine Gluconate Cloth  6 each Topical Daily  . diltiazem  120 mg Oral Daily  . ferrous sulfate  325 mg Oral Q breakfast  . gabapentin  200 mg Oral TID  . insulin aspart  0-15 Units Subcutaneous Q4H  . insulin glargine  8 Units Subcutaneous QHS  . mouth rinse  15 mL Mouth Rinse BID  . metoprolol tartrate  25 mg Oral TID  . vancomycin variable dose per unstable renal function (pharmacist dosing)   Does not apply See admin instructions   Infusions:  . heparin 1,400 Units/hr (12/24/18 1033)  . piperacillin-tazobactam (ZOSYN)  IV 3.375 g (12/24/18 2227)    Assessment: Pharmacy consulted for heparin drip management for 84 yo male admitted on 7/29 with right heel ulcer and confusion. Patient with angioplasty on 7/27. Patient takes rivaroxaban 15mg  with supper. Last dose 7/28 at 1700. Patient with history significant for atrial fibrillation, CHF, CKD, diabetes, hypertension, hyperlipidemia, left BKA.   Goal of Therapy:  Heparin level 0.3-0.7 units/ml aPTT  66-102 seconds Monitor platelets by anticoagulation protocol: Yes  Heparin Course: 7/29 PM initiation: drip initated at 1200 units/hr (no bolus) 7/30 0100 aPTT 41s: 1200 units/h 7/30 0630 aPTT 58s: inc rate to 1300 units/hr 7/30 1721 aPTT 52s: inc rate to 1400 units/hr 7/31 0400 aPTT 83 seconds therapeutic, HL 0.80 supratherapeutic and not correlating. 7/31 1221 aPTT 71s: Therapeutic x2.  7/31 1857 Heparin stopped  (Restarted 8/2@1034 ) 8/2   1813 HL 0.64 therapeutic    Plan:  08/03 @ 0200 HL 0.81 supratherapeutic. Per RN patient having no issues w/ bleeding. Will decrease rate to 1200 units/hr and will recheck HL @ 1100, hgb also down to 8.9 from 12.4, BL hgb around 11, patient s/p POD2 R heel ulcer debridement, and POD7 catheter placement for PAD w/ ulceration of RLE. Will continue to monitor.  Tobie Lords, PharmD Clinical Pharmacist 12/25/2018 3:13 AM

## 2018-12-25 NOTE — Progress Notes (Addendum)
ANTICOAGULATION CONSULT NOTE  Pharmacy Consult for Heparin Drip Management  Indication: atrial fibrillation  Patient Measurements: Height: 5\' 7"  (170.2 cm) Weight: 176 lb 2.4 oz (79.9 kg) IBW/kg (Calculated) : 66.1  Vital Signs: Temp: 98.2 F (36.8 C) (08/03 0758) BP: 131/66 (08/03 0758) Pulse Rate: 51 (08/03 0758)  Labs: Recent Labs    12/23/18 0515  12/24/18 0537 12/24/18 1813 12/25/18 0213 12/25/18 1128  HGB 10.6*  --  12.4*  --  8.9*  --   HCT 32.4*  --  38.8*  --  28.6*  --   PLT 296  --  328  --  284  --   APTT 26  --   --   --   --   --   HEPARINUNFRC  --    < > <0.10* 0.64 0.81* 0.38  CREATININE 1.39*  --  1.61*  --  1.53*  --    < > = values in this interval not displayed.    Estimated Creatinine Clearance: 38.3 mL/min (A) (by C-G formula based on SCr of 1.53 mg/dL (H)).   Medications:  Scheduled:  . amiodarone  200 mg Oral Daily  . aspirin EC  81 mg Oral Daily  . atorvastatin  80 mg Oral QPM  . Chlorhexidine Gluconate Cloth  6 each Topical Daily  . diltiazem  120 mg Oral Daily  . ferrous sulfate  325 mg Oral Q breakfast  . gabapentin  300 mg Oral TID  . insulin aspart  0-15 Units Subcutaneous Q4H  . insulin glargine  8 Units Subcutaneous QHS  . mouth rinse  15 mL Mouth Rinse BID  . metoprolol tartrate  25 mg Oral TID   Infusions:  . heparin 1,200 Units/hr (12/25/18 0800)  . piperacillin-tazobactam (ZOSYN)  IV 12.5 mL/hr at 12/25/18 0800  . vancomycin      Assessment: Pharmacy consulted for heparin drip management for 84 yo male admitted on 7/29 with right heel ulcer and confusion. Patient with angioplasty on 7/27. Patient takes rivaroxaban 15mg  with supper. Last dose 7/28 at 1700. Patient with history significant for atrial fibrillation, CHF, CKD, diabetes, hypertension, hyperlipidemia, left BKA.   Goal of Therapy:  Heparin level 0.3-0.7 units/ml aPTT 66-102 seconds Monitor platelets by anticoagulation protocol: Yes  Heparin Course: 7/29 PM  initiation: drip initated at 1200 units/hr (no bolus) 7/30 0100 aPTT 41s: 1200 units/h 7/30 0630 aPTT 58s: inc rate to 1300 units/hr 7/30 1721 aPTT 52s: inc rate to 1400 units/hr 7/31 0400 aPTT 83 seconds therapeutic, HL 0.80 supratherapeutic and not correlating. 7/31 1221 aPTT 71s: Therapeutic x2.  7/31 1857 Heparin stopped  (Restarted 8/2@1034 ) 8/2 1813 HL 0.64 therapeutic  8/3 0200 HL 0.81 supratherapeutic.  Per RN, pt having no issues w/ bleeding. Rate decreased to 1200 units/hr.     Plan:  08/03 @ 1138 HL 0.38 therapeutic. Spoke with RN, patient having no issues w/ bleeding.  Hgb down to 8.9 from 12.4, BL hgb around 11, patient s/p POD2 R heel ulcer debridement, and POD7 catheter placement for PAD w/ ulceration of RLE. Will continue to monitor.  Per protocol, next HL ordered for 8/3 @ 1930.  CBC 8/4 AM.  [name], PharmD Pharmacy Resident  12/25/2018 1:38 PM

## 2018-12-25 NOTE — Progress Notes (Signed)
Pharmacy Electrolyte Monitoring Consult:  Pharmacy consulted to assist in monitoring and replacing electrolytes in this 84 y.o. male admitted on 12/20/2018.  Labs:  Sodium (mmol/L)  Date Value  12/25/2018 140  01/20/2013 140   Potassium (mmol/L)  Date Value  12/25/2018 3.9  01/20/2013 3.5   Magnesium (mg/dL)  Date Value  12/25/2018 2.1   Phosphorus (mg/dL)  Date Value  12/23/2018 3.6   Calcium (mg/dL)  Date Value  12/25/2018 7.9 (L)   Calcium, Total (mg/dL)  Date Value  01/20/2013 9.4   Albumin (g/dL)  Date Value  12/22/2018 2.1 (L)  01/19/2013 3.7     Assessment/Plan: Electrolytes: Pt is on amiodarone, lasix 20 mg PO daily stopped 8/2.   K 3.9, Mag 2.1  Will replace for goal potassium ~ 4 and goal magnesium ~ 2.   No replacement needed today.    Glucose: Patient transitioned off insulin drip. Currently on Lantus 8 units QHS and Q4hr SSI. Patient was NPO for procedure, starting diet 8/2. MD managing BG.  Will sign off at this time as pt out of ICU  Rocky Morel, PharmD, BCPS 12/25/2018 7:59 AM

## 2018-12-25 NOTE — Care Management Important Message (Signed)
Important Message  Patient Details  Name: [name] MRN: [id number] Date of Birth: 12-Nov-1934   Medicare Important Message Given:  Yes     [name] 12/25/2018, 11:29 AM

## 2018-12-25 NOTE — Progress Notes (Signed)
ANTICOAGULATION CONSULT NOTE  Pharmacy Consult for Heparin Drip Management  Indication: atrial fibrillation  Patient Measurements: Height: 5\' 7"  (170.2 cm) Weight: 176 lb 2.4 oz (79.9 kg) IBW/kg (Calculated) : 66.1  Vital Signs: Temp: 98.5 F (36.9 C) (08/03 1540) Temp Source: Oral (08/03 1540) BP: 135/70 (08/03 1540) Pulse Rate: 55 (08/03 1540)  Labs: Recent Labs    12/23/18 0515 12/24/18 0537  12/25/18 0213 12/25/18 1128 12/25/18 1930  HGB 10.6* 12.4*  --  8.9*  --   --   HCT 32.4* 38.8*  --  28.6*  --   --   PLT 296 328  --  284  --   --   APTT 26  --   --   --   --   --   HEPARINUNFRC  --  <0.10*   < > 0.81* 0.38 0.34  CREATININE 1.39* 1.61*  --  1.53*  --   --    < > = values in this interval not displayed.    Estimated Creatinine Clearance: 38.3 mL/min (A) (by C-G formula based on SCr of 1.53 mg/dL (H)).   Medications:  Scheduled:  . amiodarone  200 mg Oral Daily  . aspirin EC  81 mg Oral Daily  . atorvastatin  80 mg Oral QPM  . Chlorhexidine Gluconate Cloth  6 each Topical Daily  . diltiazem  120 mg Oral Daily  . ferrous sulfate  325 mg Oral Q breakfast  . gabapentin  300 mg Oral TID  . insulin aspart  0-15 Units Subcutaneous Q4H  . insulin glargine  12 Units Subcutaneous QHS  . mouth rinse  15 mL Mouth Rinse BID  . metoprolol tartrate  25 mg Oral TID  . polyethylene glycol  17 g Oral Daily   Infusions:  . heparin 1,200 Units/hr (12/25/18 1800)  . piperacillin-tazobactam (ZOSYN)  IV 3.375 g (12/25/18 1347)  . vancomycin 190 mL/hr at 12/25/18 1800    Assessment: Pharmacy consulted for heparin drip management for 84 yo male admitted on 7/29 with right heel ulcer and confusion. Patient with angioplasty on 7/27. Patient takes rivaroxaban 15mg  with supper. Last dose 7/28 at 1700. Patient with history significant for atrial fibrillation, CHF, CKD, diabetes, hypertension, hyperlipidemia, left BKA.   Goal of Therapy:  Heparin level 0.3-0.7 units/ml aPTT  66-102 seconds Monitor platelets by anticoagulation protocol: Yes  Heparin Course: 7/29 PM initiation: drip initated at 1200 units/hr (no bolus) 7/30 0100 aPTT 41s: 1200 units/h 7/30 0630 aPTT 58s: inc rate to 1300 units/hr 7/30 1721 aPTT 52s: inc rate to 1400 units/hr 7/31 0400 aPTT 83 seconds therapeutic, HL 0.80 supratherapeutic and not correlating. 7/31 1221 aPTT 71s: Therapeutic x2.  7/31 1857 Heparin stopped  (Restarted 8/2@1034 ) 8/2 1813 HL 0.64 therapeutic  8/3 0200 HL 0.81 supratherapeutic.  Per RN, pt having no issues w/ bleeding. Rate decreased to 1200 units/hr.   08/03 @ 1138 HL 0.38 therapeutic.   Plan:  08/03 @ 1930 HL 0.34 therapeutic.Continue current rate of 1200 units/hr. Per protocol, next HL ordered with am labs   Chinita Greenland PharmD Clinical Pharmacist 12/25/2018

## 2018-12-25 NOTE — Progress Notes (Signed)
Patient woke up at 3:30 am asked for something to eat and drink. He ate a boxed sandwich and had 2 cups of water and 2 cup of soda and is now on a cup of diet ginger ale. He is awake alert and talkative. Patient reporting pain and PRN pain medication given.

## 2018-12-26 DIAGNOSIS — L89152 Pressure ulcer of sacral region, stage 2: Secondary | ICD-10-CM

## 2018-12-26 LAB — BASIC METABOLIC PANEL
Anion gap: 8 (ref 5–15)
BUN: 27 mg/dL — ABNORMAL HIGH (ref 8–23)
CO2: 29 mmol/L (ref 22–32)
Calcium: 8.1 mg/dL — ABNORMAL LOW (ref 8.9–10.3)
Chloride: 100 mmol/L (ref 98–111)
Creatinine, Ser: 1.38 mg/dL — ABNORMAL HIGH (ref 0.61–1.24)
GFR calc Af Amer: 55 mL/min — ABNORMAL LOW (ref >60)
GFR calc non Af Amer: 48 mL/min — ABNORMAL LOW (ref >60)
Glucose, Bld: 153 mg/dL — ABNORMAL HIGH (ref 70–99)
Potassium: 3.8 mmol/L (ref 3.5–5.1)
Sodium: 137 mmol/L (ref 135–145)

## 2018-12-26 LAB — CBC
HCT: 31.7 % — ABNORMAL LOW (ref 39.0–52.0)
Hemoglobin: 9.9 g/dL — ABNORMAL LOW (ref 13.0–17.0)
MCH: 28.6 pg (ref 26.0–34.0)
MCHC: 31.2 g/dL (ref 30.0–36.0)
MCV: 91.6 fL (ref 80.0–100.0)
Platelets: 334 10*3/uL (ref 150–400)
RBC: 3.46 MIL/uL — ABNORMAL LOW (ref 4.22–5.81)
RDW: 14.6 % (ref 11.5–15.5)
WBC: 13.5 10*3/uL — ABNORMAL HIGH (ref 4.0–10.5)
nRBC: 0 % (ref 0.0–0.2)

## 2018-12-26 LAB — GLUCOSE, CAPILLARY
Glucose-Capillary: 140 mg/dL — ABNORMAL HIGH (ref 70–99)
Glucose-Capillary: 147 mg/dL — ABNORMAL HIGH (ref 70–99)
Glucose-Capillary: 190 mg/dL — ABNORMAL HIGH (ref 70–99)
Glucose-Capillary: 193 mg/dL — ABNORMAL HIGH (ref 70–99)
Glucose-Capillary: 199 mg/dL — ABNORMAL HIGH (ref 70–99)
Glucose-Capillary: 230 mg/dL — ABNORMAL HIGH (ref 70–99)

## 2018-12-26 LAB — MAGNESIUM: Magnesium: 2.1 mg/dL (ref 1.7–2.4)

## 2018-12-26 LAB — SURGICAL PATHOLOGY

## 2018-12-26 LAB — HEPARIN LEVEL (UNFRACTIONATED): Heparin Unfractionated: 0.38 IU/mL (ref 0.30–0.70)

## 2018-12-26 MED ORDER — SODIUM CHLORIDE 0.9 % IV SOLN
INTRAVENOUS | Status: DC
  Administered 2018-12-27 – 2018-12-28 (×3): via INTRAVENOUS

## 2018-12-26 MED ORDER — SODIUM CHLORIDE 0.9 % IV SOLN
1.0000 g | Freq: Two times a day (BID) | INTRAVENOUS | Status: DC
  Administered 2018-12-26 – 2018-12-31 (×10): 1 g via INTRAVENOUS
  Filled 2018-12-26 (×11): qty 1

## 2018-12-26 NOTE — Progress Notes (Addendum)
[facility] [place] NAME: Tywone Bembenek    MR#:  [id number]  DATE OF BIRTH:  [birth year]  SUBJECTIVE:  CHIEF COMPLAINT:   Chief Complaint  Patient presents with  . Shortness of Breath   Patient awake and alert and oriented this morning.  Patient reevaluated by podiatrist this morning and recommendation is for right BKA due to progression of gangrene.  Vascular surgery service consulted.  No fevers.  REVIEW OF SYSTEMS:  Review of Systems  Constitutional: Negative for chills and fever.  HENT: Negative for hearing loss and tinnitus.   Eyes: Negative for blurred vision and double vision.  Respiratory: Negative for cough and shortness of breath.   Cardiovascular: Negative for chest pain and palpitations.  Gastrointestinal: Negative for abdominal pain, heartburn, nausea and vomiting.  Genitourinary: Negative for dysuria and urgency.  Musculoskeletal: Negative for neck pain.       Pain at site of recent surgical debridement.  Skin: Negative for itching and rash.  Neurological: Negative for dizziness and headaches.  Psychiatric/Behavioral: Negative for depression and hallucinations.      DRUG ALLERGIES:   Allergies  Allergen Reactions  . Contrast Media [Iodinated Diagnostic Agents] Shortness Of Breath    SOB  . Iohexol Shortness Of Breath     Desc: Respiratory Distress, Laryngedema, diaphoresis, Onset Date: [id number]   . Metrizamide Shortness Of Breath    SOB SOB SOB   . Latex Rash   VITALS:  Blood pressure (!) 141/51, pulse 63, temperature 99.1 F (37.3 C), temperature source Oral, resp. rate 16, height 5\' 7"  (1.702 m), weight 79.9 kg, SpO2 99 %. PHYSICAL EXAMINATION:  Physical Exam  GENERAL:84 y.o.-year-old patient lying in the bed, feels miserable secondary to right foot pain EYES: Pupils equal, round, reactive to light and accommodation. No scleral icterus. Extraocular muscles intact.  HEENT: Head atraumatic,  normocephalic. Oropharynx and nasopharynx clear.  NECK: Supple, no jugular venous distention. No thyroid enlargement, no tenderness.  LUNGS: Normal breath sounds bilaterally,bibasilar crackles,no wheezing, rhonchi or crepitation. No use of accessory muscles of respiration.  CARDIOVASCULAR: S1, S2 normal. No rubs, or gallops. 3/6 systolic murmur present ABDOMEN: Soft, nontender, nondistended. Bowel sounds present. No organomegaly or mass.  EXTREMITIES:s/p left BKA and right foot swelling with a medial heel ulcer.  Wound VAC in place to right foot. NEUROLOGIC: Awake and alert and oriented.  No focal deficit.   PSYCHIATRIC: Patient awake and alert and oriented.  Normal mood.   SKIN: No obvious rash, lesion.  Dressing in place to right foot.  LABORATORY PANEL:  Male CBC Recent Labs  Lab 12/26/18 0343  WBC 13.5*  HGB 9.9*  HCT 31.7*  PLT 334   ------------------------------------------------------------------------------------------------------------------ Chemistries  Recent Labs  Lab 12/20/18 0602  12/26/18 0343  NA 134*   < > 137  K 4.0   < > 3.8  CL 99   < > 100  CO2 23   < > 29  GLUCOSE 521*   < > 153*  BUN 28*   < > 27*  CREATININE 1.33*   < > 1.38*  CALCIUM 8.7*   < > 8.1*  MG  --    < > 2.1  AST 23  --   --   ALT 22  --   --   ALKPHOS 168*  --   --   BILITOT 1.2  --   --    < > = values in this interval not displayed.  RADIOLOGY:  No results found. ASSESSMENT AND PLAN:   [name] an 84 y.o.malewith a known history of atrial fibrillation on Xarelto, CAD, ischemic cardiomyopathy with EF of 35 to 40%, hypertension, peripheral vascular disease status post left BKA and right leg angioplasty 2 days ago, obstructive sleep apnea on CPAP presents to hospital secondary to confusion and worsening shortness of breath   1. Acute hypoxic respiratory failure-secondary to acute pulmonary edema. -Acute on chronic systolic heart failure. Last cardiac cath was in  January 2020 showing chronic occlusions and collaterals from LAD. Medical management recommended. EF was around 35%. -Patient was admitted on high flow nasal cannula, much improved with IV diuresis. -Renal function slowly improving  -Currently on 2 L oxygen which is acute.  Wean off oxygen as tolerated. Change Lasix to oral  2. sepsis-present on admission.  Secondary to Diabetic right heel ulcer-appears necrotic, ischemic ulcer. Status post recent revascularization and arterial stents placement on 12/18/2018. -Appreciate ID, podiatry and vascular consult. -Appreciate podiatry, ID and vascular consult. .  Blood cultures growing Proteus mirabilis.  Wound cultures so far growing Pseudomonas and MRSA.  Patient is on Zosyn and IV vancomycin with pharmacy to assist with dosing  -MRI of the ankle -showing large wound in the posterior aspect of the heel, septic Achilles tendinopathy and possible abscess and pre-Achilles region with osteomyelitis of the calcaneus with cellulitis and mild fasciitis. Patient status post debridement in the OR on 12/23/2018 and patient has wound VAC in place. Patient was evaluated by podiatrist this morning.  Right BKA recommended by podiatrist due to progression of gangrene.  Vascular surgery consulted with plans for right BKA tomorrow. Heparin drip to be stopped at 8 AM tomorrow  3. Uncontrolled diabetes mellitus- A1c is 8.9,  Patient said to have had episode of hypoglycemia recently and Lantus insulin decreased from 12 to 8 units nightly to prevent recurrent hypoglycemia.  We will add pre-meal NovoLog insulin once patient has consistent p.o. intake.  Continue sliding scale insulin coverage and monitor.  4. A. fib-rate controlled. Continue cardiac medications. Patient on Cardizem, metoprolol and amiodarone Patient noted to have episodes of sinus bradycardia with heart rate down to the 40s intermittently..  Discontinued metoprolol today -Xarelto on hold for procedure.   Patient currently on heparin drip.   5. CKD stage III-stable. Continue to monitor  6. Acute kidney injury. Held off on home dose of Lasix due to recent worsening of renal function.  Noted improvement in renal function this morning.  7.  Confusion-secondary to acute metabolic encephalopathy from underlying infection and pain medications. Significantly improved.  Patient awake and alert and oriented this morning.  DVT prophylaxis-patient already on Heparin drip   We will get physical therapy to evaluate patient when more awake and alert. Wheelchair-bound with walker transfers at baseline  All the records are reviewed and case discussed with Care Management/Social Worker. Management plans discussed with the patient, family and they are in agreement. I called patient's wife today and updated her on treatment plans as well as plans for right BKA tomorrow.  She agrees with plan of care and will update her son as well.    CODE STATUS: Full Code  TOTAL TIME TAKING CARE OF THIS PATIENT: 35 minutes.   More than 50% of the time was spent in counseling/coordination of care: YES  POSSIBLE D/C IN 3 DAYS, DEPENDING ON CLINICAL CONDITION.   Chestine Belknap M.D on 12/26/2018 at 1:08 PM  Between 7am to 6pm - Pager - [phone]  After 6pm  go to [email or web address] - Proofreader  [facility]  Office  [phone]  CC: Primary care physician; Corey Skains, MD  Note: This dictation was prepared with Dragon dictation along with smaller phrase technology. Any transcriptional errors that result from this process are unintentional.

## 2018-12-26 NOTE — Progress Notes (Signed)
Pharmacy Antibiotic Note  [name] is a 84 y.o. male admitted on 12/20/2018 with sepsis d/t necrotic diabetic right heel ulcer.  On 8/1, he underwent R heel ulcer debridement, and on 7/27 had an angioplasty with stent.  Pharmacy has been consulted for vancomycin and meropenem dosing.  Bone cx are notable for Proteus mirabilis and E coli with ESBLs.  Wound cx positive for MRSA and Pseudomonas.  MRSA covered with vanc.  Cultured Gram negative organisms are pan-sensitive to carbapenems.  Zosyn stopped, and replaced with meropenem for ESBL coverage.  Pt planned to undergo right BKA.  Plan: Initiate meropenem 1 g Q12H.     Continue Vancomycin 1000 mg IV Q 24 hrs. Goal AUC 400-550. Expected AUC: 470.1 SCr used: 1.38 Expected Cmin: 12.6  Height: 5\' 7"  (170.2 cm) Weight: 176 lb 2.4 oz (79.9 kg) IBW/kg (Calculated) : 66.1  Temp (24hrs), Avg:98.9 F (37.2 C), Min:98.5 F (36.9 C), Max:99.1 F (37.3 C)  Recent Labs  Lab 12/20/18 0602 12/20/18 1137  12/22/18 0402 12/23/18 0515 12/24/18 0537 12/25/18 0213 12/25/18 1128 12/26/18 0343  WBC 28.3*  --    < > 15.6* 16.3* 18.8* 19.1*  --  13.5*  CREATININE 1.33*  --    < > 1.46* 1.39* 1.61* 1.53*  --  1.38*  LATICACIDVEN 2.8* 2.4*  --   --   --   --   --   --   --   VANCORANDOM  --   --   --   --   --   --   --  10  --    < > = values in this interval not displayed.    Estimated Creatinine Clearance: 42.5 mL/min (A) (by C-G formula based on SCr of 1.38 mg/dL (H)).    Allergies  Allergen Reactions  . Contrast Media [Iodinated Diagnostic Agents] Shortness Of Breath    SOB  . Iohexol Shortness Of Breath     Desc: Respiratory Distress, Laryngedema, diaphoresis, Onset Date: [id number]   . Metrizamide Shortness Of Breath    SOB SOB SOB   . Latex Rash    Antimicrobials this admission: Zosyn (7/29) >> (8/4) D/C'd Vanc (8/2) >>  Meropenem (8/4) >>  Dose adjustments this admission: None  Microbiology results: 8/1 Bone cx:  Few Proteus mirabilis, rare E coli, ESBL 8/1 Wound right heel: MRSA 7/29 Wound cx: PSA and MRSA  7/29 1/4 BCx: Proteus mirabilis    Thank you for allowing pharmacy to be a part of this patient's care.  Gerald Dexter, PharmD Pharmacy Resident  12/26/2018 3:03 PM

## 2018-12-26 NOTE — Progress Notes (Signed)
ANTICOAGULATION CONSULT NOTE  Pharmacy Consult for Heparin Drip Management  Indication: atrial fibrillation  Patient Measurements: Height: 5\' 7"  (170.2 cm) Weight: 176 lb 2.4 oz (79.9 kg) IBW/kg (Calculated) : 66.1  Vital Signs: Temp: 99.1 F (37.3 C) (08/04 0752) Temp Source: Oral (08/04 0752) BP: 141/51 (08/04 0752) Pulse Rate: 63 (08/04 0824)  Labs: Recent Labs    12/24/18 0537  12/25/18 0213 12/25/18 1128 12/25/18 1930 12/26/18 0343  HGB 12.4*  --  8.9*  --   --  9.9*  HCT 38.8*  --  28.6*  --   --  31.7*  PLT 328  --  284  --   --  334  HEPARINUNFRC <0.10*   < > 0.81* 0.38 0.34 0.38  CREATININE 1.61*  --  1.53*  --   --  1.38*   < > = values in this interval not displayed.    Estimated Creatinine Clearance: 42.5 mL/min (A) (by C-G formula based on SCr of 1.38 mg/dL (H)).   Medications:  Scheduled:  . amiodarone  200 mg Oral Daily  . aspirin EC  81 mg Oral Daily  . atorvastatin  80 mg Oral QPM  . Chlorhexidine Gluconate Cloth  6 each Topical Daily  . diltiazem  120 mg Oral Daily  . ferrous sulfate  325 mg Oral Q breakfast  . gabapentin  300 mg Oral TID  . insulin aspart  0-15 Units Subcutaneous Q4H  . insulin glargine  12 Units Subcutaneous QHS  . mouth rinse  15 mL Mouth Rinse BID  . metoprolol tartrate  25 mg Oral TID  . polyethylene glycol  17 g Oral Daily   Infusions:  . [START ON 12/27/2018] sodium chloride    . heparin 1,200 Units/hr (12/26/18 1000)  . piperacillin-tazobactam (ZOSYN)  IV 12.5 mL/hr at 12/26/18 1000  . vancomycin 190 mL/hr at 12/25/18 1800    Assessment: Pharmacy consulted for heparin drip management for 84 yo male admitted on 7/29 with right heel ulcer and confusion. Patient with angioplasty on 7/27. Patient takes rivaroxaban at home 15mg  with supper. Last dose 7/28 at 1700. Patient with history significant for atrial fibrillation, CHF, CKD, diabetes, hypertension, hyperlipidemia, left BKA.   Goal of Therapy:  Heparin level  0.3-0.7 units/ml aPTT 66-102 seconds Monitor platelets by anticoagulation protocol: Yes  Heparin Course: 7/29 PM initiation: drip initated at 1200 units/hr (no bolus) 7/30 0100 aPTT 41s: 1200 units/h 7/30 0630 aPTT 58s: inc rate to 1300 units/hr 7/30 1721 aPTT 52s: inc rate to 1400 units/hr 7/31 0400 aPTT 83 seconds therapeutic, HL 0.80 supratherapeutic and not correlating. 7/31 1221 aPTT 71s: Therapeutic x2.  7/31 1857 Heparin stopped  (Restarted 8/2@1034 ) 8/2 1813 HL 0.64 therapeutic  8/3 0200 HL 0.81 supratherapeutic.  Per RN, pt having no issues w/ bleeding. Rate decreased to 1200 units/hr.   08/03 @ 1138 HL 0.38 therapeutic. 08/03 @ 1930 HL 0.34 therapeutic. 08/04 @ 0343 HL 0.38 therapeutic   Plan:  Continue heparin gtt at current rate (1200 units/hr).  Of note, heparin to stop per MD on 08/05 @ 0800 for below-knee amputation.  Per protocol, next HL and CBC ordered with AM labs.  Gerald Dexter, PharmD Pharmacy Resident  12/26/2018 12:32 PM

## 2018-12-26 NOTE — Progress Notes (Signed)
ID Rt necrotic heel ulcer -s/p surgery but has progressing necrosis-          Dr.Fowler is recommending BKA  multiple organisms cultured proteus very rare E.coli ( ESBL), MRSA, pseudomonas Currently on meropenem and vanco-  Antibiotic duration and type depends on further surgical intervention

## 2018-12-26 NOTE — Progress Notes (Signed)
ANTICOAGULATION CONSULT NOTE  Pharmacy Consult for Heparin Drip Management  Indication: atrial fibrillation  Patient Measurements: Height: 5\' 7"  (170.2 cm) Weight: 176 lb 2.4 oz (79.9 kg) IBW/kg (Calculated) : 66.1  Vital Signs: Temp: 99.1 F (37.3 C) (08/04 0019) Temp Source: Oral (08/04 0019) BP: 141/53 (08/04 0019) Pulse Rate: 51 (08/04 0019)  Labs: Recent Labs    12/24/18 0537  12/25/18 0213 12/25/18 1128 12/25/18 1930 12/26/18 0343  HGB 12.4*  --  8.9*  --   --  9.9*  HCT 38.8*  --  28.6*  --   --  31.7*  PLT 328  --  284  --   --  334  HEPARINUNFRC <0.10*   < > 0.81* 0.38 0.34 0.38  CREATININE 1.61*  --  1.53*  --   --  1.38*   < > = values in this interval not displayed.    Estimated Creatinine Clearance: 42.5 mL/min (A) (by C-G formula based on SCr of 1.38 mg/dL (H)).   Medications:  Scheduled:  . amiodarone  200 mg Oral Daily  . aspirin EC  81 mg Oral Daily  . atorvastatin  80 mg Oral QPM  . Chlorhexidine Gluconate Cloth  6 each Topical Daily  . diltiazem  120 mg Oral Daily  . ferrous sulfate  325 mg Oral Q breakfast  . gabapentin  300 mg Oral TID  . insulin aspart  0-15 Units Subcutaneous Q4H  . insulin glargine  12 Units Subcutaneous QHS  . mouth rinse  15 mL Mouth Rinse BID  . metoprolol tartrate  25 mg Oral TID  . polyethylene glycol  17 g Oral Daily   Infusions:  . heparin 1,200 Units/hr (12/25/18 1800)  . piperacillin-tazobactam (ZOSYN)  IV 3.375 g (12/25/18 1347)  . vancomycin 190 mL/hr at 12/25/18 1800    Assessment: Pharmacy consulted for heparin drip management for 84 yo male admitted on 7/29 with right heel ulcer and confusion. Patient with angioplasty on 7/27. Patient takes rivaroxaban 15mg  with supper. Last dose 7/28 at 1700. Patient with history significant for atrial fibrillation, CHF, CKD, diabetes, hypertension, hyperlipidemia, left BKA.   Goal of Therapy:  Heparin level 0.3-0.7 units/ml aPTT 66-102 seconds Monitor platelets by  anticoagulation protocol: Yes  Heparin Course: 7/29 PM initiation: drip initated at 1200 units/hr (no bolus) 7/30 0100 aPTT 41s: 1200 units/h 7/30 0630 aPTT 58s: inc rate to 1300 units/hr 7/30 1721 aPTT 52s: inc rate to 1400 units/hr 7/31 0400 aPTT 83 seconds therapeutic, HL 0.80 supratherapeutic and not correlating. 7/31 1221 aPTT 71s: Therapeutic x2.  7/31 1857 Heparin stopped  (Restarted 8/2@1034 ) 8/2 1813 HL 0.64 therapeutic  8/3 0200 HL 0.81 supratherapeutic.  Per RN, pt having no issues w/ bleeding. Rate decreased to 1200 units/hr.   08/03 @ 1138 HL 0.38 therapeutic. 08/03 @ 1930 HL 0.34 therapeutic.   Plan:  Continue current rate of 1200 units/hr. Per protocol, next HL ordered with am labs   Hart Robinsons, PharmD 12/26/2018

## 2018-12-26 NOTE — Progress Notes (Signed)
Daily Progress Note   Subjective  - 3 Days Post-Op  F/u debridment right heel.  STill c/o pain  Objective Vitals:   12/25/18 0758 12/25/18 1540 12/26/18 0019 12/26/18 0752  BP: 131/66 135/70 (!) 141/53 (!) 141/51  Pulse: (!) 51 (!) 55 (!) 51 (!) 52  Resp: 18 18 16    Temp: 98.2 F (36.8 C) 98.5 F (36.9 C) 99.1 F (37.3 C) 99.1 F (37.3 C)  TempSrc:  Oral Oral Oral  SpO2: 99% 100% 98% 99%  Weight:      Height:        Physical Exam: Wound vac changed.  Exposed heel and tissue from debridment is stable but still some necrosis.  Necrosis to medial and lateral foot worsening.  Laboratory CBC    Component Value Date/Time   WBC 13.5 (H) 12/26/2018 0343   HGB 9.9 (L) 12/26/2018 0343   HGB 16.3 01/20/2013 0255   HCT 31.7 (L) 12/26/2018 0343   HCT 47.9 01/20/2013 0255   PLT 334 12/26/2018 0343   PLT 245 01/20/2013 0255    BMET    Component Value Date/Time   NA 137 12/26/2018 0343   NA 140 01/20/2013 0255   K 3.8 12/26/2018 0343   K 3.5 01/20/2013 0255   CL 100 12/26/2018 0343   CL 103 01/20/2013 0255   CO2 29 12/26/2018 0343   CO2 30 01/20/2013 0255   GLUCOSE 153 (H) 12/26/2018 0343   GLUCOSE 145 (H) 01/20/2013 0255   BUN 27 (H) 12/26/2018 0343   BUN 22 (H) 01/20/2013 0255   CREATININE 1.38 (H) 12/26/2018 0343   CREATININE 1.09 01/20/2013 0255   CALCIUM 8.1 (L) 12/26/2018 0343   CALCIUM 9.4 01/20/2013 0255   GFRNONAA 48 (L) 12/26/2018 0343   GFRNONAA >60 01/20/2013 0255   GFRAA 55 (L) 12/26/2018 0343   GFRAA >60 01/20/2013 0255   Bone culture with proteus and e. Coli. Assessment/Planning: Osteomyelitis right heel. Worsening necrosis to ankle and foot   The heel wound is improved from an infection standpoint.  Unfortunately there is worsening areas of necrosis to ankle and foot.  There is little hope for limb salvage as calcaneus is growing proteus and e. Coli.  I have recommended amputation to pt at this time.  Will consult vascular to discuss  options.  Wound vac changed and padding applied.  Will follow.    Samara Deist A  12/26/2018, 8:14 AM

## 2018-12-26 NOTE — Progress Notes (Addendum)
[facility] Daily Progress Note   Subjective: 12/18/18: 1. Ultrasound guidance for vascular access left femoral artery 2. Catheter placement into right common femoral artery from left femoral approach 3. Aortogram and selective right lower extremity angiogram 4. Percutaneous transluminal angioplasty of right anterior tibial artery with 3 mm diameter angioplasty balloon 5.  Percutaneous transluminal angioplasty of the right SFA and proximal popliteal artery with a 5 mm diameter by 30 and a 5 mm diameter by 10 cm length Lutonix drug-coated angioplasty balloon             6.  Percutaneous transluminal angioplasty of the right tibioperoneal trunk and proximal peroneal artery with 3 mm diameter angioplasty balloon             7.  Viabahn stent placement to the right mid to distal SFA and proximal popliteal artery with a 6 mm diameter by 25 cm length stent 8. StarClose closure device left femoral artery  Patient is status post debridement to the right heel by podiatry.  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  Physical exam after debridement with worsening necrosis to the ankle and foot.  Patient is still complaining of right foot and calf pain.  Objective: Vitals:   12/25/18 1540 12/26/18 0019 12/26/18 0752 12/26/18 0824  BP: 135/70 (!) 141/53 (!) 141/51   Pulse: (!) 55 (!) 51 (!) 52 63  Resp: 18 16    Temp: 98.5 F (36.9 C) 99.1 F (37.3 C) 99.1 F (37.3 C)   TempSrc: Oral Oral Oral   SpO2: 100% 98% 99%   Weight:      Height:        Intake/Output Summary (Last 24 hours) at 12/26/2018 1139 Last data filed at 12/26/2018 1000 Gross per 24 hour  Intake 741.07 ml  Output 75 ml  Net 666.07 ml   Physical Exam: A&Ox3, NAD CV: Irregularly irregular Pulmonary: CTA Bilaterally Abdomen: Soft, Nontender, Nondistended Vascular:  Right Lower Extremity: VAC intact.  There is now progression  of necrotic tissue to the medial and lateral foot distally.     Laboratory: CBC    Component Value Date/Time   WBC 13.5 (H) 12/26/2018 0343   HGB 9.9 (L) 12/26/2018 0343   HGB 16.3 01/20/2013 0255   HCT 31.7 (L) 12/26/2018 0343   HCT 47.9 01/20/2013 0255   PLT 334 12/26/2018 0343   PLT 245 01/20/2013 0255   BMET    Component Value Date/Time   NA 137 12/26/2018 0343   NA 140 01/20/2013 0255   K 3.8 12/26/2018 0343   K 3.5 01/20/2013 0255   CL 100 12/26/2018 0343   CL 103 01/20/2013 0255   CO2 29 12/26/2018 0343   CO2 30 01/20/2013 0255   GLUCOSE 153 (H) 12/26/2018 0343   GLUCOSE 145 (H) 01/20/2013 0255   BUN 27 (H) 12/26/2018 0343   BUN 22 (H) 01/20/2013 0255   CREATININE 1.38 (H) 12/26/2018 0343   CREATININE 1.09 01/20/2013 0255   CALCIUM 8.1 (L) 12/26/2018 0343   CALCIUM 9.4 01/20/2013 0255   GFRNONAA 48 (L) 12/26/2018 0343   GFRNONAA >60 01/20/2013 0255   GFRAA 55 (L) 12/26/2018 0343   GFRAA >60 01/20/2013 0255   Assessment/Planning: The patient is an 84 year old male with multiple medical issues including lower extremity peripheral artery disease.  The patient is status post a left BKA which is now healed in the past.  The patient is status post a right lower extremity endovascular intervention and debridement  to the heel by podiatry.  Unfortunately, the patient is showering minimal improvement to the heel wound with progression of disease/gangrenous changes to the medial and lateral aspect of the right foot.  At this time, podiatry feels limb salvage is futile due to the progression of gangrene and the calcaneus growing Proteus and E. Coli. 1) I personally spoke with the patient this morning in regard to moving forward with a below the knee amputation.  He understands the procedure, risks and benefits.  All his questions were answered.  He understands that if we do not amputate he is at risk for further progression of gangrene, sepsis and death.  The patient is willing to  move forward.  Surgery is planned for tomorrow with Dr. Lucky Cowboy. 2) Heparin gtt to stop at 800am tomorrow as surgery is scheduled at 1200pm  Discussed with Dr. Ellis [facility] PA-C 12/26/2018 11:39 AM

## 2018-12-26 NOTE — TOC Progression Note (Signed)
Transition of Care [facility]) - Progression Note    Patient Details  Name: [name] MRN: [id number] Date of Birth: 10-May-1935  Transition of Care [facility]) CM/SW Contact  Su Hilt, RN Phone Number: 12/26/2018, 4:16 PM  Clinical Narrative:     Called the son [name] at his request  He was wanting to set up something to get Power of attorney.  I explained he would need an attorney for that.  He stated that he has an attorney and actually would not be able to do it until the patient is better after surgery. He thanked me for calling      Expected Discharge Plan and Services                                                 Social Determinants of Health (SDOH) Interventions    Readmission Risk Interventions Readmission Risk Prevention Plan 12/25/2018 12/23/2018  Transportation Screening Complete Complete  PCP or Specialist Appt within 3-5 Days Complete -  HRI or [place] Complete Complete  Social Work Consult for [place] Planning/Counseling Complete -  Palliative Care Screening Not Applicable -  Medication Review Press photographer) Complete Complete  Some recent data might be hidden

## 2018-12-26 NOTE — Progress Notes (Addendum)
Pharmacy Antibiotic Note  [name] is a 84 y.o. male admitted on 12/20/2018 with wound infection growing MRSA and Pseudomonas.  Blood cultures notable for Proteus mirabilis.  On 8/1, he underwent R heel ulcer debridement, and on 7/27 had an angioplasty with stent.  Pharmacy has been consulted for vancomycin dosing.  He is also on Zosyn, and recently received a vanc loading dose of 2000 mg x 1 on 8/2.   Plan: Continue Vancomycin 1000 mg IV Q 24 hrs. Goal AUC 400-550. Expected AUC: 470.1 SCr used: 1.38 Expected Cmin: 12.6  Height: 5\' 7"  (170.2 cm) Weight: 176 lb 2.4 oz (79.9 kg) IBW/kg (Calculated) : 66.1  Temp (24hrs), Avg:98.6 F (37 C), Min:98.2 F (36.8 C), Max:99.1 F (37.3 C)  Recent Labs  Lab 12/20/18 0602 12/20/18 1137  12/22/18 0402 12/23/18 0515 12/24/18 0537 12/25/18 0213 12/25/18 1128 12/26/18 0343  WBC 28.3*  --    < > 15.6* 16.3* 18.8* 19.1*  --  13.5*  CREATININE 1.33*  --    < > 1.46* 1.39* 1.61* 1.53*  --  1.38*  LATICACIDVEN 2.8* 2.4*  --   --   --   --   --   --   --   VANCORANDOM  --   --   --   --   --   --   --  10  --    < > = values in this interval not displayed.    Estimated Creatinine Clearance: 42.5 mL/min (A) (by C-G formula based on SCr of 1.38 mg/dL (H)).    Allergies  Allergen Reactions  . Contrast Media [Iodinated Diagnostic Agents] Shortness Of Breath    SOB  . Iohexol Shortness Of Breath     Desc: Respiratory Distress, Laryngedema, diaphoresis, Onset Date: [id number]   . Metrizamide Shortness Of Breath    SOB SOB SOB   . Latex Rash    Antimicrobials this admission: Zosyn (7/29) >>  Vanc (8/2) >>   Dose adjustments this admission: None  Microbiology results: 8/3 Bone cx: Few Proteus mirabilis, rare E coli 8/3 Wound cx: PSA and MRSA sensitive to pip/tazo and vancomycin  8/2 1/4 BCx: Proteus mirabilis sensitive to pip/tazo   Thank you for allowing pharmacy to be a part of this patient's care.  Gerald Dexter, PharmD Pharmacy Resident  12/26/2018 7:28 AM

## 2018-12-26 NOTE — Progress Notes (Signed)
Inpatient Diabetes Program Recommendations  AACE/ADA: New Consensus Statement on Inpatient Glycemic Control   Target Ranges:  Prepandial:   less than 140 mg/dL      Peak postprandial:   less than 180 mg/dL (1-2 hours)      Critically ill patients:  140 - 180 mg/dL   Results for NIKITAS, DAVTYAN (MRN [id number]) as of 12/26/2018 09:49  Ref. Range 12/25/2018 07:56 12/25/2018 11:51 12/25/2018 17:07 12/25/2018 20:45 12/26/2018 00:22 12/26/2018 04:24 12/26/2018 07:51  Glucose-Capillary Latest Ref Range: 70 - 99 mg/dL 285 (H)  Novolog 8 units 267 (H)  Novolog 8 units 244 (H)  Novolog 5 units 331 (H)  Novolog 11 units  Lantus 12 units 190 (H)  Novolog 3 units 147 (H)  Novolog 2 units 140 (H)  Novolog 2 units   Review of Glycemic Control  Diabetes history:DM2 Outpatient Diabetes medications:Lantus 15 units QHS, Humalog 3 units with meals plus 1 unit for every 50 mg/dl above target glucose of 200 mg/dl Current orders for Inpatient glycemic control:Lantus 12 units daily, Novolog 0-15 units Q4H   Insulin-Meal Coverage: Please consider ordering Novolog 4 units TID with meals for meal coverage if patient eats at least 50% of meals.  Thanks, Barnie Alderman, RN, MSN, CDE Diabetes Coordinator Inpatient Diabetes Program [phone] (Team Pager from 8am to 5pm)

## 2018-12-27 ENCOUNTER — Inpatient Hospital Stay: Payer: Medicare Other | Admitting: Anesthesiology

## 2018-12-27 ENCOUNTER — Encounter
Admission: EM | Disposition: A | Discharge status: Skilled Nursing Facility | Payer: Self-pay | Source: Home / Self Care | Attending: Internal Medicine

## 2018-12-27 DIAGNOSIS — I70261 Atherosclerosis of native arteries of extremities with gangrene, right leg: Secondary | ICD-10-CM

## 2018-12-27 HISTORY — PX: AMPUTATION: SHX166

## 2018-12-27 LAB — CBC
HCT: 34.5 % — ABNORMAL LOW (ref 39.0–52.0)
Hemoglobin: 11 g/dL — ABNORMAL LOW (ref 13.0–17.0)
MCH: 28.8 pg (ref 26.0–34.0)
MCHC: 31.9 g/dL (ref 30.0–36.0)
MCV: 90.3 fL (ref 80.0–100.0)
Platelets: 405 10*3/uL — ABNORMAL HIGH (ref 150–400)
RBC: 3.82 MIL/uL — ABNORMAL LOW (ref 4.22–5.81)
RDW: 14.5 % (ref 11.5–15.5)
WBC: 13.6 10*3/uL — ABNORMAL HIGH (ref 4.0–10.5)
nRBC: 0 % (ref 0.0–0.2)

## 2018-12-27 LAB — BASIC METABOLIC PANEL
Anion gap: 10 (ref 5–15)
BUN: 22 mg/dL (ref 8–23)
CO2: 27 mmol/L (ref 22–32)
Calcium: 8.4 mg/dL — ABNORMAL LOW (ref 8.9–10.3)
Chloride: 100 mmol/L (ref 98–111)
Creatinine, Ser: 1.28 mg/dL — ABNORMAL HIGH (ref 0.61–1.24)
GFR calc Af Amer: >60 mL/min (ref >60)
GFR calc non Af Amer: 52 mL/min — ABNORMAL LOW (ref >60)
Glucose, Bld: 141 mg/dL — ABNORMAL HIGH (ref 70–99)
Potassium: 4.1 mmol/L (ref 3.5–5.1)
Sodium: 137 mmol/L (ref 135–145)

## 2018-12-27 LAB — GLUCOSE, CAPILLARY
Glucose-Capillary: 102 mg/dL — ABNORMAL HIGH (ref 70–99)
Glucose-Capillary: 127 mg/dL — ABNORMAL HIGH (ref 70–99)
Glucose-Capillary: 134 mg/dL — ABNORMAL HIGH (ref 70–99)
Glucose-Capillary: 211 mg/dL — ABNORMAL HIGH (ref 70–99)
Glucose-Capillary: 235 mg/dL — ABNORMAL HIGH (ref 70–99)
Glucose-Capillary: 97 mg/dL (ref 70–99)
Glucose-Capillary: 98 mg/dL (ref 70–99)

## 2018-12-27 LAB — APTT: aPTT: 57 seconds — ABNORMAL HIGH (ref 24–36)

## 2018-12-27 LAB — MAGNESIUM: Magnesium: 2.2 mg/dL (ref 1.7–2.4)

## 2018-12-27 LAB — PROTIME-INR
INR: 1.2 (ref 0.8–1.2)
Prothrombin Time: 14.6 seconds (ref 11.4–15.2)

## 2018-12-27 LAB — HEPARIN LEVEL (UNFRACTIONATED): Heparin Unfractionated: 0.23 IU/mL — ABNORMAL LOW (ref 0.30–0.70)

## 2018-12-27 SURGERY — AMPUTATION BELOW KNEE
Anesthesia: General | Laterality: Right

## 2018-12-27 MED ORDER — BUPIVACAINE-EPINEPHRINE (PF) 0.25% -1:200000 IJ SOLN
INTRAMUSCULAR | Status: AC
  Filled 2018-12-27: qty 30

## 2018-12-27 MED ORDER — ROCURONIUM BROMIDE 100 MG/10ML IV SOLN
INTRAVENOUS | Status: DC | PRN
  Administered 2018-12-27: 40 mg via INTRAVENOUS

## 2018-12-27 MED ORDER — MIDAZOLAM HCL 2 MG/2ML IJ SOLN
INTRAMUSCULAR | Status: AC
  Filled 2018-12-27: qty 2

## 2018-12-27 MED ORDER — DEXAMETHASONE SODIUM PHOSPHATE 10 MG/ML IJ SOLN
INTRAMUSCULAR | Status: AC
  Filled 2018-12-27: qty 1

## 2018-12-27 MED ORDER — PROPOFOL 10 MG/ML IV BOLUS
INTRAVENOUS | Status: DC | PRN
  Administered 2018-12-27: 60 mg via INTRAVENOUS

## 2018-12-27 MED ORDER — PHENYLEPHRINE HCL (PRESSORS) 10 MG/ML IV SOLN
INTRAVENOUS | Status: AC
  Filled 2018-12-27: qty 1

## 2018-12-27 MED ORDER — SUCCINYLCHOLINE CHLORIDE 20 MG/ML IJ SOLN
INTRAMUSCULAR | Status: AC
  Filled 2018-12-27: qty 1

## 2018-12-27 MED ORDER — LIDOCAINE HCL (CARDIAC) PF 100 MG/5ML IV SOSY
PREFILLED_SYRINGE | INTRAVENOUS | Status: DC | PRN
  Administered 2018-12-27: 70 mg via INTRAVENOUS

## 2018-12-27 MED ORDER — ONDANSETRON HCL 4 MG/2ML IJ SOLN
INTRAMUSCULAR | Status: AC
  Filled 2018-12-27: qty 2

## 2018-12-27 MED ORDER — RIVAROXABAN 15 MG PO TABS
15.0000 mg | ORAL_TABLET | Freq: Every day | ORAL | Status: DC
  Administered 2018-12-27 – 2018-12-31 (×5): 15 mg via ORAL
  Filled 2018-12-27 (×5): qty 1

## 2018-12-27 MED ORDER — PHENYLEPHRINE HCL (PRESSORS) 10 MG/ML IV SOLN
INTRAVENOUS | Status: DC | PRN
  Administered 2018-12-27: 200 ug via INTRAVENOUS
  Administered 2018-12-27: 100 ug via INTRAVENOUS

## 2018-12-27 MED ORDER — SODIUM CHLORIDE 0.9 % IV SOLN
INTRAVENOUS | Status: DC | PRN
  Administered 2018-12-27: 30 mL

## 2018-12-27 MED ORDER — BUPIVACAINE-EPINEPHRINE (PF) 0.25% -1:200000 IJ SOLN
INTRAMUSCULAR | Status: DC | PRN
  Administered 2018-12-27: 30 mL

## 2018-12-27 MED ORDER — MORPHINE SULFATE (PF) 4 MG/ML IV SOLN: 2.0000 mg | INTRAVENOUS | Status: DC | PRN

## 2018-12-27 MED ORDER — FENTANYL CITRATE (PF) 100 MCG/2ML IJ SOLN
INTRAMUSCULAR | Status: DC | PRN
  Administered 2018-12-27 (×2): 50 ug via INTRAVENOUS

## 2018-12-27 MED ORDER — LIDOCAINE HCL (PF) 2 % IJ SOLN
INTRAMUSCULAR | Status: AC
  Filled 2018-12-27: qty 10

## 2018-12-27 MED ORDER — SUGAMMADEX SODIUM 200 MG/2ML IV SOLN
INTRAVENOUS | Status: DC | PRN
  Administered 2018-12-27: 200 mg via INTRAVENOUS

## 2018-12-27 MED ORDER — SODIUM CHLORIDE FLUSH 0.9 % IV SOLN
INTRAVENOUS | Status: AC
  Filled 2018-12-27: qty 50

## 2018-12-27 MED ORDER — BUPIVACAINE LIPOSOME 1.3 % IJ SUSP
INTRAMUSCULAR | Status: AC
  Filled 2018-12-27: qty 20

## 2018-12-27 MED ORDER — SODIUM CHLORIDE 0.9 % IV SOLN
INTRAVENOUS | Status: DC | PRN
  Administered 2018-12-27: 13:00:00 50 ug/min via INTRAVENOUS

## 2018-12-27 SURGICAL SUPPLY — 41 items
BANDAGE ELASTIC 6 LF NS (GAUZE/BANDAGES/DRESSINGS) ×2 IMPLANT
BLADE SAGITTAL WIDE XTHICK NO (BLADE) ×1 IMPLANT
BLADE SAW SAG 25.4X90 (BLADE) ×1 IMPLANT
BNDG COHESIVE 4X5 TAN STRL (GAUZE/BANDAGES/DRESSINGS) ×2 IMPLANT
BNDG GAUZE 4.5X4.1 6PLY STRL (MISCELLANEOUS) ×4 IMPLANT
BRUSH SCRUB EZ  4% CHG (MISCELLANEOUS) ×1
BRUSH SCRUB EZ 4% CHG (MISCELLANEOUS) ×1 IMPLANT
CANISTER SUCT 1200ML W/VALVE (MISCELLANEOUS) ×2 IMPLANT
CHLORAPREP W/TINT 26ML (MISCELLANEOUS) ×2 IMPLANT
COVER WAND RF STERILE (DRAPES) ×2 IMPLANT
DRAIN PENROSE 1/4X12 LTX (DRAIN) IMPLANT
DRAPE INCISE IOBAN 66X45 STRL (DRAPES) ×1 IMPLANT
DRSG TEGADERM 6X8 (GAUZE/BANDAGES/DRESSINGS) ×1 IMPLANT
ELECT CAUTERY BLADE 6.4 (BLADE) ×2 IMPLANT
ELECT REM PT RETURN 9FT ADLT (ELECTROSURGICAL) ×2
ELECTRODE REM PT RTRN 9FT ADLT (ELECTROSURGICAL) ×1 IMPLANT
GAUZE XEROFORM 1X8 LF (GAUZE/BANDAGES/DRESSINGS) ×3 IMPLANT
GLOVE BIO SURGEON STRL SZ7 (GLOVE) ×4 IMPLANT
GLOVE INDICATOR 7.5 STRL GRN (GLOVE) ×2 IMPLANT
GOWN STRL REUS W/ TWL LRG LVL3 (GOWN DISPOSABLE) ×2 IMPLANT
GOWN STRL REUS W/ TWL XL LVL3 (GOWN DISPOSABLE) ×1 IMPLANT
GOWN STRL REUS W/TWL LRG LVL3 (GOWN DISPOSABLE) ×2
GOWN STRL REUS W/TWL XL LVL3 (GOWN DISPOSABLE) ×1
HANDLE YANKAUER SUCT BULB TIP (MISCELLANEOUS) ×2 IMPLANT
KIT TURNOVER KIT A (KITS) ×2 IMPLANT
LABEL OR SOLS (LABEL) ×2 IMPLANT
NS IRRIG 1000ML POUR BTL (IV SOLUTION) ×2 IMPLANT
PACK EXTREMITY ARMC (MISCELLANEOUS) ×2 IMPLANT
PAD ABD DERMACEA PRESS 5X9 (GAUZE/BANDAGES/DRESSINGS) ×4 IMPLANT
PAD PREP 24X41 OB/GYN DISP (PERSONAL CARE ITEMS) ×2 IMPLANT
SPONGE LAP 18X18 RF (DISPOSABLE) ×3 IMPLANT
STAPLER SKIN PROX 35W (STAPLE) ×2 IMPLANT
STOCKINETTE M/LG 89821 (MISCELLANEOUS) ×2 IMPLANT
SUT SILK 2 0 (SUTURE) ×1
SUT SILK 2 0 SH (SUTURE) ×4 IMPLANT
SUT SILK 2-0 18XBRD TIE 12 (SUTURE) ×1 IMPLANT
SUT SILK 3 0 (SUTURE) ×1
SUT SILK 3-0 18XBRD TIE 12 (SUTURE) ×1 IMPLANT
SUT VIC AB 0 CT1 36 (SUTURE) ×5 IMPLANT
SUT VIC AB 2-0 CT1 (SUTURE) ×5 IMPLANT
SYR 30ML LL (SYRINGE) ×1 IMPLANT

## 2018-12-27 NOTE — Progress Notes (Signed)
[facility] [place] NAME: [name]    MR#:  [id number]  DATE OF BIRTH:  03-16-1935  SUBJECTIVE:  CHIEF COMPLAINT:   Chief Complaint  Patient presents with  . Shortness of Breath   Patient awake and alert and oriented this morning.  Patient scheduled for right below-knee amputation today due to progression of gangrene   REVIEW OF SYSTEMS:  Review of Systems  Constitutional: Negative for chills and fever.  HENT: Negative for hearing loss and tinnitus.   Eyes: Negative for blurred vision and double vision.  Respiratory: Negative for cough and shortness of breath.   Cardiovascular: Negative for chest pain and palpitations.  Gastrointestinal: Negative for abdominal pain, heartburn, nausea and vomiting.  Genitourinary: Negative for dysuria and urgency.  Musculoskeletal: Negative for neck pain.       Pain at site of recent surgical debridement.  Skin: Negative for itching and rash.  Neurological: Negative for dizziness and headaches.  Psychiatric/Behavioral: Negative for depression and hallucinations.      DRUG ALLERGIES:   Allergies  Allergen Reactions  . Contrast Media [Iodinated Diagnostic Agents] Shortness Of Breath    SOB  . Iohexol Shortness Of Breath     Desc: Respiratory Distress, Laryngedema, diaphoresis, Onset Date: [id number]   . Metrizamide Shortness Of Breath    SOB SOB SOB   . Latex Rash   VITALS:  Blood pressure (!) 165/59, pulse (!) 56, temperature 98.6 F (37 C), resp. rate (!) 21, height 5\' 7"  (1.702 m), weight 79.9 kg, SpO2 100 %. PHYSICAL EXAMINATION:  Physical Exam  GENERAL:84 y.o.-year-old patient lying in the bed, feels miserable secondary to right foot pain EYES: Pupils equal, round, reactive to light and accommodation. No scleral icterus. Extraocular muscles intact.  HEENT: Head atraumatic, normocephalic. Oropharynx and nasopharynx clear.  NECK: Supple, no jugular venous distention. No  thyroid enlargement, no tenderness.  LUNGS: Normal breath sounds bilaterally,bibasilar crackles,no wheezing, rhonchi or crepitation. No use of accessory muscles of respiration.  CARDIOVASCULAR: S1, S2 normal. No rubs, or gallops. 3/6 systolic murmur present ABDOMEN: Soft, nontender, nondistended. Bowel sounds present. No organomegaly or mass.  EXTREMITIES:s/p left BKA and right foot swelling with a medial heel ulcer.  Wound VAC in place to right foot. NEUROLOGIC: Awake and alert and oriented.  No focal deficit.   PSYCHIATRIC: Patient awake and alert and oriented.  Normal mood.   SKIN: No obvious rash, lesion.  Dressing in place to right foot.  LABORATORY PANEL:  Male CBC Recent Labs  Lab 12/27/18 0328  WBC 13.6*  HGB 11.0*  HCT 34.5*  PLT 405*   ------------------------------------------------------------------------------------------------------------------ Chemistries  Recent Labs  Lab 12/27/18 0328  NA 137  K 4.1  CL 100  CO2 27  GLUCOSE 141*  BUN 22  CREATININE 1.28*  CALCIUM 8.4*  MG 2.2   RADIOLOGY:  No results found. ASSESSMENT AND PLAN:   [name] a83 y.o.malewith a known history of atrial fibrillation on Xarelto, CAD, ischemic cardiomyopathy with EF of 35 to 40%, hypertension, peripheral vascular disease status post left BKA and right leg angioplasty 2 days ago, obstructive sleep apnea on CPAP presented to hospital secondary to confusion and worsening shortness of breath   1. Acute hypoxic respiratory failure-secondary to acute pulmonary edema. -Acute on chronic systolic heart failure. Last cardiac cath was in January 2020 showing chronic occlusions and collaterals from LAD. Medical management recommended. EF was around 35%. -Patient was admitted on high flow nasal cannula, much improved  with IV diuresis. -Renal function slowly improving  -Currently on 2 L oxygen which is acute.  Wean off oxygen as tolerated. Change Lasix to oral  2.  Sepsis-present on admission.  Secondary to Diabetic right heel ulcer-appears necrotic, ischemic ulcer. Status post recent revascularization and arterial stents placement on 12/18/2018. -Appreciate ID, podiatry and vascular consult. -Appreciate podiatry, ID and vascular consult. .  Blood cultures growing Proteus mirabilis.  Wound cultures so far growing Pseudomonas and MRSA.  Patient is on Zosyn and IV vancomycin with pharmacy to assist with dosing  -MRI of the ankle -showing large wound in the posterior aspect of the heel, septic Achilles tendinopathy and possible abscess and pre-Achilles region with osteomyelitis of the calcaneus with cellulitis and mild fasciitis. Patient status post debridement in the OR on 12/23/2018 and patient has wound VAC in place. Patient was evaluated by podiatrist and right BKA recommended by podiatrist due to progression of gangrene.   Patient scheduled for right BKA today  3. Uncontrolled diabetes mellitus- A1c is 8.9,  Patient said to have had episode of hypoglycemia recently and Lantus insulin decreased from 12 to 8 units nightly to prevent recurrent hypoglycemia.  We will add pre-meal NovoLog insulin once patient has consistent p.o. intake.  Continue sliding scale insulin coverage and monitor.  4. A. fib-rate controlled. Continue cardiac medications. Patient on Cardizem, metoprolol and amiodarone Patient noted to have episodes of sinus bradycardia with heart rate down to the 40s intermittently..  Discontinued metoprolol previously.  Recent heart rate of 50-57. -Xarelto on hold for procedure.  Patient currently on heparin drip.   5. CKD stage III-stable. Continue to monitor  6. Acute kidney injury. Held off on home dose of Lasix due to recent worsening of renal function.  Noted improvement in renal function   7.  Confusion-secondary to acute metabolic encephalopathy from underlying infection and pain medications. Significantly improved.  Patient awake  and alert and oriented this morning.  DVT prophylaxis-patient already on Heparin drip which was placed on hold this morning due to plans for right BKA  We will get physical therapy to evaluate patient when more awake and alert. Wheelchair-bound with walker transfers at baseline  All the records are reviewed and case discussed with Care Management/Social Worker. Management plans discussed with the patient, family and they are in agreement. I called patient's wife yesterday  and updated her on treatment plans as well as plans for right BKA for today .  She agrees with plan of care and will update her son as well.    CODE STATUS: Full Code  TOTAL TIME TAKING CARE OF THIS PATIENT: 37 minutes.   More than 50% of the time was spent in counseling/coordination of care: YES  POSSIBLE D/C IN 2 DAYS, DEPENDING ON CLINICAL CONDITION.   Ayshia Gramlich M.D on 12/27/2018 at 1:35 PM  Between 7am to 6pm - Pager - [phone]  After 6pm go to [email or web address] - Proofreader  [facility]  Office  [phone]  CC: Primary care physician; Corey Skains, MD  Note: This dictation was prepared with Dragon dictation along with smaller phrase technology. Any transcriptional errors that result from this process are unintentional.

## 2018-12-27 NOTE — Anesthesia Post-op Follow-up Note (Signed)
Anesthesia QCDR form completed.        

## 2018-12-27 NOTE — Op Note (Signed)
OPERATIVE NOTE   PROCEDURE: Right below-the-knee amputation  PRE-OPERATIVE DIAGNOSIS: Right foot gangrene  POST-OPERATIVE DIAGNOSIS: same as above  SURGEON: Leotis Pain, MD  ASSISTANT(S): Hezzie Bump, PA-C  ANESTHESIA: general  ESTIMATED BLOOD LOSS: 100 cc  FINDING(S): none  SPECIMEN(S):  Right below-the-knee amputation  INDICATIONS:   [name] is a 84 y.o. male who presents with right leg gangrene.  The patient is scheduled for a right below-the-knee amputation.  I discussed in depth with the patient the risks, benefits, and alternatives to this procedure.  The patient is aware that the risk of this operation included but are not limited to:  bleeding, infection, myocardial infarction, stroke, death, failure to heal amputation wound, and possible need for more proximal amputation.  The patient is aware of the risks and agrees proceed forward with the procedure. An assistant was present during the procedure to help facilitate the exposure and expedite the procedure.  DESCRIPTION:  After full informed written consent was obtained from the patient, the patient was brought back to the operating room, and placed supine upon the operating table.  Prior to induction, the patient received IV antibiotics.  The patient was then prepped and draped in the standard fashion for a below-the-knee amputation.  After obtaining adequate anesthesia, the patient was prepped and draped in the standard fashion for a right below-the-knee amputation. The assistant provided retraction and mobilization to help facilitate exposure and expedite the procedure throughout the entire procedure.  This included following suture, using retractors, and optimizing lighting. I marked out the anterior incision two finger breadths below the tibial tuberosity and then the marked out a posterior flap that was one third of the circumference of the calf in length.   I made the incisions for these flaps, and then  dissected through the subcutaneous tissue, fascia, and muscle anteriorly.  I elevated  the periosteal tissue superiorly so that the tibia was about 3-4 cm shorter than the anterior skin flap.  I then transected the tibia with a power saw and then took a wedge off the tibia anteriorly with the power saw.  Then I smoothed out the rough edges.  In a similar fashion, I cut back the fibula about two centimeters higher than the level of the tibia with a bone cutter.  I put a bone hook into the distal tibia and then used a large amputation knife to sharply develop a tissue plane through the muscle along the fibula.  In such fashion, the posterior flap was developed.  At this point, the specimen was passed off the field as the below-the-knee amputation.  At this point, I clamped all visibly bleeding arteries and veins using a combination of suture ligation with Silk suture and electrocautery.  Bleeding continued to be controlled with electrocautery and suture ligature.  The stump was washed off with sterile normal saline and no further active bleeding was noted.  I reapproximated the anterior and posterior fascia  with interrupted stitches of 0 Vicryl.  This was completed along the entire length of anterior and posterior fascia until there were no more loose space in the fascial line. I then placed a layer of 2-0 Vicryl sutures in the subcutaneous tissue. The skin was then  reapproximated with staples.  The stump was washed off and dried.  The incision was dressed with Xeroform and  then fluffs were applied.  Kerlix was wrapped around the leg and then gently an ACE wrap was applied.    COMPLICATIONS: none  CONDITION:  stable   Leotis Pain  12/27/2018, 12:40 PM    This note was created with Dragon Medical transcription system. Any errors in dictation are purely unintentional.

## 2018-12-27 NOTE — Progress Notes (Signed)
ANTICOAGULATION CONSULT NOTE  Pharmacy Consult for Heparin Drip Management  Indication: atrial fibrillation  Patient Measurements: Height: 5\' 7"  (170.2 cm) Weight: 176 lb 2.4 oz (79.9 kg) IBW/kg (Calculated) : 66.1  Vital Signs: Temp: 99.3 F (37.4 C) (08/05 0128) Temp Source: Oral (08/05 0128) BP: 138/60 (08/05 0128) Pulse Rate: 53 (08/05 0128)  Labs: Recent Labs    12/25/18 0213  12/25/18 1930 12/26/18 0343 12/27/18 0328  HGB 8.9*  --   --  9.9* 11.0*  HCT 28.6*  --   --  31.7* 34.5*  PLT 284  --   --  334 405*  APTT  --   --   --   --  57*  LABPROT  --   --   --   --  14.6  INR  --   --   --   --  1.2  HEPARINUNFRC 0.81*   < > 0.34 0.38 0.23*  CREATININE 1.53*  --   --  1.38* 1.28*   < > = values in this interval not displayed.    Estimated Creatinine Clearance: 45.8 mL/min (A) (by C-G formula based on SCr of 1.28 mg/dL (H)).   Medications:  Scheduled:  . amiodarone  200 mg Oral Daily  . aspirin EC  81 mg Oral Daily  . atorvastatin  80 mg Oral QPM  . Chlorhexidine Gluconate Cloth  6 each Topical Daily  . diltiazem  120 mg Oral Daily  . ferrous sulfate  325 mg Oral Q breakfast  . gabapentin  300 mg Oral TID  . insulin aspart  0-15 Units Subcutaneous Q4H  . insulin glargine  12 Units Subcutaneous QHS  . mouth rinse  15 mL Mouth Rinse BID  . polyethylene glycol  17 g Oral Daily   Infusions:  . sodium chloride Stopped (12/27/18 0506)  . heparin 1,200 Units/hr (12/27/18 0506)  . meropenem (MERREM) IV 1 g (12/27/18 0506)  . vancomycin Stopped (12/26/18 1349)    Assessment: Pharmacy consulted for heparin drip management for 84 yo male admitted on 7/29 with right heel ulcer and confusion. Patient with angioplasty on 7/27. Patient takes rivaroxaban at home 15mg  with supper. Last dose 7/28 at 1700. Patient with history significant for atrial fibrillation, CHF, CKD, diabetes, hypertension, hyperlipidemia, left BKA.   Goal of Therapy:  Heparin level 0.3-0.7  units/ml aPTT 66-102 seconds Monitor platelets by anticoagulation protocol: Yes  Heparin Course: 7/29 PM initiation: drip initated at 1200 units/hr (no bolus) 7/30 0100 aPTT 41s: 1200 units/h 7/30 0630 aPTT 58s: inc rate to 1300 units/hr 7/30 1721 aPTT 52s: inc rate to 1400 units/hr 7/31 0400 aPTT 83 seconds therapeutic, HL 0.80 supratherapeutic and not correlating. 7/31 1221 aPTT 71s: Therapeutic x2.  7/31 1857 Heparin stopped  (Restarted 8/2@1034 ) 8/2 1813 HL 0.64 therapeutic  8/3 0200 HL 0.81 supratherapeutic.  Per RN, pt having no issues w/ bleeding. Rate decreased to 1200 units/hr.   08/03 @ 1138 HL 0.38 therapeutic. 08/03 @ 1930 HL 0.34 therapeutic. 08/04 @ 0343 HL 0.38 therapeutic 8/5 @ 0328    HL 0.23 subtherapeutic (heparin infusion to be stopped at 0800 for surgery)   Plan:  Increase heparin drip to 1350 units/hr.  Of note, heparin to stop per MD on 08/05 @ 0800 for below-knee amputation.  Confirmed with nurse that heparin infusion was not interrupted prior to lab draw.  Anticipate heparin to resume after surgery.  Follow up plans after surgery to recheck labs, HL and CBC  Vinnie Gombert A,  PharmD 12/27/2018 6:01 AM

## 2018-12-27 NOTE — H&P (Signed)
[facility] History & Physical Update  The patient was interviewed and re-examined.  The patient's previous History and Physical has been reviewed and is unchanged.  There is no change in the plan of care. We plan to proceed with the scheduled procedure.  Leotis Pain, MD  12/27/2018, 11:12 AM

## 2018-12-27 NOTE — Progress Notes (Addendum)
Pharmacy Antibiotic Note  [name] is a 84 y.o. male admitted on 12/20/2018 with necrotic diabetic right heel ulcer. Podiatry recommending amputation due to worsening necrosis despite angioplasty and debridement. Bone cx notable for Proteus mirabilis, ESBL, and pseudomonas.  Wound cx notable for MRSA and Pseudomonas. Pharmacy consulted for vancomycin and meropenem dosing.  Zosyn d/c 8/4 and meropenem started for ESBL coverage. 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 ID following.    Plan: Continue meropenem 1 g Q12H.     Continue Vancomycin 1000 mg IV Q 24 hrs. Goal AUC 400-550. Expected AUC: 439.8 Expected Cmin: 11.4 SCr used: 1.28  Plan to obtain vancomycin level at steady state (Thursday or Friday)  Height: 5\' 7"  (170.2 cm) Weight: 176 lb 2.4 oz (79.9 kg) IBW/kg (Calculated) : 66.1  Temp (24hrs), Avg:98.6 F (37 C), Min:97.8 F (36.6 C), Max:99.3 F (37.4 C)  Recent Labs  Lab 12/23/18 0515 12/24/18 0537 12/25/18 0213 12/25/18 1128 12/26/18 0343 12/27/18 0328  WBC 16.3* 18.8* 19.1*  --  13.5* 13.6*  CREATININE 1.39* 1.61* 1.53*  --  1.38* 1.28*  VANCORANDOM  --   --   --  10  --   --     Estimated Creatinine Clearance: 45.8 mL/min (A) (by C-G formula based on SCr of 1.28 mg/dL (H)).    Allergies  Allergen Reactions  . Contrast Media [Iodinated Diagnostic Agents] Shortness Of Breath    SOB  . Iohexol Shortness Of Breath     Desc: Respiratory Distress, Laryngedema, diaphoresis, Onset Date: [id number]   . Metrizamide Shortness Of Breath    SOB SOB SOB   . Latex Rash    Antimicrobials this admission: Zosyn (7/29) >> (8/4) D/C'd Vanc (8/2) >>  Meropenem (8/4) >>  Dose adjustments this admission: None  Microbiology results: 8/1 Bone cx: Few Proteus mirabilis, ESBL, pseudomonas 8/1 Wound right heel: MRSA 7/29 Wound cx: pseudomonas and MRSA  7/29 1/4 BCx: Proteus mirabilis    Thank you for allowing pharmacy to be a part of this patient's care.  Benita Gutter, PharmD Pharmacy Resident  12/27/2018 2:07 PM

## 2018-12-27 NOTE — Transfer of Care (Signed)
Immediate Anesthesia Transfer of Care Note  Patient: [name]  Procedure(s) Performed: AMPUTATION BELOW KNEE (Right )  Patient Location: PACU  Anesthesia Type:General  Level of Consciousness: sedated  Airway & Oxygen Therapy: Patient Spontanous Breathing and Patient connected to face mask oxygen  Post-op Assessment: Report given to RN and Post -op Vital signs reviewed and stable  Post vital signs: Reviewed and stable  Last Vitals:  Vitals Value Taken Time  BP 165/59 12/27/18 1303  Temp    Pulse 56 12/27/18 1305  Resp 21 12/27/18 1305  SpO2 100 % 12/27/18 1305  Vitals shown include unvalidated device data.  Last Pain:  Vitals:   12/27/18 1115  TempSrc: Oral  PainSc:       Patients Stated Pain Goal: 0 (58/00/63 4949)  Complications: No apparent anesthesia complications

## 2018-12-27 NOTE — Anesthesia Preprocedure Evaluation (Addendum)
Anesthesia Evaluation  Patient identified by MRN, date of birth, ID band Patient awake    Reviewed: Allergy & Precautions, H&P , NPO status , Patient's Chart, lab work & pertinent test results  Airway Mallampati: II  TM Distance: >3 FB     Dental  (+) Edentulous Lower, Upper Dentures   Pulmonary sleep apnea , neg COPD, former smoker,           Cardiovascular hypertension, + Past MI, + Peripheral Vascular Disease and +CHF  + dysrhythmias Atrial Fibrillation   Echo Jan 2020: - Left ventricle: Systolic function was mildly reduced. The   estimated ejection fraction was in the range of 45% to 50%.   Hypokinesis of the anteroseptal myocardium. Hypokinesis of the   apical myocardium.   Neuro/Psych neg Seizures PSYCHIATRIC DISORDERS Dementia negative neurological ROS     GI/Hepatic negative GI ROS, Neg liver ROS,   Endo/Other  negative endocrine ROSdiabetes, Poorly Controlled, Insulin Dependent  Renal/GU CRFRenal disease     Musculoskeletal  (+) Arthritis ,   Abdominal   Peds  Hematology negative hematology ROS (+)   Anesthesia Other Findings Past Medical History: No date: Arthritis No date: Atrial fibrillation (HCC) No date: Carcinoma of prostate (HCC) No date: CHF (congestive heart failure) (HCC) No date: Chronic kidney disease No date: Diabetes mellitus without complication (HCC) No date: ED (erectile dysfunction) No date: Frequent urination No date: Hyperlipidemia No date: Hypertension No date: Moderate mitral insufficiency No date: Peripheral vascular disease ([place]) 04/2016: Pneumonia No date: Prostate cancer ([place]) No date: Sleep apnea     Comment:  OSA--USE C-PAP No date: Ulcer of left foot due to type 2 diabetes mellitus ([place]) No date: Urinary stress incontinence, male  Past Surgical History: 01/12/2017: AMPUTATION; Left     Comment:  Procedure: AMPUTATION BELOW KNEE;  Surgeon: Algernon Huxley, MD;  Location: [facility];  Service: General;                Laterality: Left; 10/15/2016: APLIGRAFT PLACEMENT; Left     Comment:  Procedure: APLIGRAFT PLACEMENT;  Surgeon: Albertine Patricia, DPM;  Location: [facility];  Service: Podiatry;                Laterality: Left;  necrotic ulcer No date: CHOLECYSTECTOMY No date: EYE SURGERY; Bilateral     Comment:  Cataract Extraction with IOL 10/13/2018: HIP ARTHROPLASTY; Right     Comment:  Procedure: ARTHROPLASTY BIPOLAR HIP (HEMIARTHROPLASTY);               Surgeon: Earnestine Leys, MD;  Location: [facility];                Service: Orthopedics;  Laterality: Right; 10/15/2016: IRRIGATION AND DEBRIDEMENT FOOT; Left     Comment:  Procedure: IRRIGATION AND DEBRIDEMENT FOOT-EXCISIONAL               DEBRIDEMENT OF SKIN 3RD, 4TH AND 5TH TOES WITH               APPLICATION OF APLIGRAFT;  Surgeon: Albertine Patricia,               DPM;  Location: [facility];  Service: Podiatry;                Laterality: Left;  necrotic,gangrene 12/09/2016: IRRIGATION AND DEBRIDEMENT FOOT; Left     Comment:  Procedure: IRRIGATION AND DEBRIDEMENT  FOOT-MUSCLE/FASCIA, RESECTION OF FOURTH AND FIFTH               METATARSAL NECROTIC BONE;  Surgeon: Albertine Patricia,               DPM;  Location: [facility];  Service: Podiatry;                Laterality: Left; 06/05/2018: LEFT HEART CATH AND CORONARY ANGIOGRAPHY; N/A     Comment:  Procedure: LEFT HEART CATH AND CORONARY ANGIOGRAPHY;                Surgeon: Yolonda Kida, MD;  Location: [place]              [facility];  Service: Cardiovascular;  Laterality: N/A; 08/16/2016: LOWER EXTREMITY ANGIOGRAPHY; Left     Comment:  Procedure: Lower Extremity Angiography;  Surgeon: Algernon Huxley, MD;  Location: [place] [facility];  Service:               Cardiovascular;  Laterality: Left; 09/01/2016: LOWER EXTREMITY ANGIOGRAPHY; Left     Comment:  Procedure: Lower Extremity Angiography;  Surgeon:  Algernon Huxley, MD;  Location: [place] [facility];  Service:               Cardiovascular;  Laterality: Left; 10/14/2016: LOWER EXTREMITY ANGIOGRAPHY; Left     Comment:  Procedure: Lower Extremity Angiography;  Surgeon: Algernon Huxley, MD;  Location: [place] [facility];  Service:               Cardiovascular;  Laterality: Left; 12/20/2016: LOWER EXTREMITY ANGIOGRAPHY; Left     Comment:  Procedure: Lower Extremity Angiography;  Surgeon: Algernon Huxley, MD;  Location: [place] [facility];  Service:               Cardiovascular;  Laterality: Left; 12/18/2018: LOWER EXTREMITY ANGIOGRAPHY; Right     Comment:  Procedure: LOWER EXTREMITY ANGIOGRAPHY;  Surgeon: Algernon Huxley, MD;  Location: [facility];  Service:               Cardiovascular;  Laterality: Right; 09/01/2016: LOWER EXTREMITY INTERVENTION     Comment:  Procedure: Lower Extremity Intervention;  Surgeon: Algernon Huxley, MD;  Location: [place] [facility];  Service:               Cardiovascular;; No date: PROSTATE SURGERY     Comment:  removal No date: TONSILLECTOMY     Comment:  as a child  BMI    Body Mass Index: 27.59 kg/m      Reproductive/Obstetrics negative OB ROS                            Anesthesia Physical  Anesthesia Plan  ASA: III  Anesthesia Plan: General ETT   Post-op Pain Management:    Induction:   PONV Risk Score and Plan: Dexamethasone, Ondansetron and Treatment may vary due to  age or medical condition  Airway Management Planned:   Additional Equipment:   Intra-op Plan:   Post-operative Plan:   Informed Consent: I have reviewed the patients History and Physical, chart, labs and discussed the procedure including the risks, benefits and alternatives for the proposed anesthesia with the patient or authorized representative who has indicated his/her understanding and acceptance.     Dental Advisory  Given  Plan Discussed with: Anesthesiologist and CRNA  Anesthesia Plan Comments: (Phone consent obtained via patient's son on 12/27/18 at 64 (wife did not answer phone).  KLF)       Anesthesia Quick Evaluation

## 2018-12-27 NOTE — Anesthesia Procedure Notes (Signed)
Procedure Name: Intubation Date/Time: 12/27/2018 11:32 AM Performed by: Philbert Riser, CRNA Pre-anesthesia Checklist: Patient identified, Emergency Drugs available, Suction available, Patient being monitored and Timeout performed Patient Re-evaluated:Patient Re-evaluated prior to induction Oxygen Delivery Method: Circle system utilized and Simple face mask Preoxygenation: Pre-oxygenation with 100% oxygen Induction Type: IV induction Ventilation: Mask ventilation without difficulty Grade View: Grade I Tube type: Oral Tube size: 7.5 mm Number of attempts: 1 Airway Equipment and Method: Stylet Placement Confirmation: ETT inserted through vocal cords under direct vision,  positive ETCO2 and breath sounds checked- equal and bilateral Secured at: 21 cm Tube secured with: Tape Dental Injury: Teeth and Oropharynx as per pre-operative assessment

## 2018-12-28 ENCOUNTER — Encounter: Payer: Self-pay | Admitting: Vascular Surgery

## 2018-12-28 LAB — BPAM RBC
Blood Product Expiration Date: 202008192359
Blood Product Expiration Date: 202008262359
Unit Type and Rh: 5100
Unit Type and Rh: 5100

## 2018-12-28 LAB — AEROBIC/ANAEROBIC CULTURE W GRAM STAIN (SURGICAL/DEEP WOUND): Gram Stain: NONE SEEN

## 2018-12-28 LAB — CBC
HCT: 30.5 % — ABNORMAL LOW (ref 39.0–52.0)
Hemoglobin: 9.7 g/dL — ABNORMAL LOW (ref 13.0–17.0)
MCH: 28.9 pg (ref 26.0–34.0)
MCHC: 31.8 g/dL (ref 30.0–36.0)
MCV: 90.8 fL (ref 80.0–100.0)
Platelets: 386 10*3/uL (ref 150–400)
RBC: 3.36 MIL/uL — ABNORMAL LOW (ref 4.22–5.81)
RDW: 14.4 % (ref 11.5–15.5)
WBC: 16.1 10*3/uL — ABNORMAL HIGH (ref 4.0–10.5)
nRBC: 0 % (ref 0.0–0.2)

## 2018-12-28 LAB — TYPE AND SCREEN
ABO/RH(D): O POS
Antibody Screen: NEGATIVE
Unit division: 0
Unit division: 0

## 2018-12-28 LAB — BASIC METABOLIC PANEL
Anion gap: 7 (ref 5–15)
BUN: 22 mg/dL (ref 8–23)
CO2: 26 mmol/L (ref 22–32)
Calcium: 8 mg/dL — ABNORMAL LOW (ref 8.9–10.3)
Chloride: 103 mmol/L (ref 98–111)
Creatinine, Ser: 1.36 mg/dL — ABNORMAL HIGH (ref 0.61–1.24)
GFR calc Af Amer: 56 mL/min — ABNORMAL LOW (ref >60)
GFR calc non Af Amer: 48 mL/min — ABNORMAL LOW (ref >60)
Glucose, Bld: 241 mg/dL — ABNORMAL HIGH (ref 70–99)
Potassium: 4.5 mmol/L (ref 3.5–5.1)
Sodium: 136 mmol/L (ref 135–145)

## 2018-12-28 LAB — MAGNESIUM: Magnesium: 2.1 mg/dL (ref 1.7–2.4)

## 2018-12-28 LAB — GLUCOSE, CAPILLARY
Glucose-Capillary: 147 mg/dL — ABNORMAL HIGH (ref 70–99)
Glucose-Capillary: 175 mg/dL — ABNORMAL HIGH (ref 70–99)
Glucose-Capillary: 175 mg/dL — ABNORMAL HIGH (ref 70–99)
Glucose-Capillary: 189 mg/dL — ABNORMAL HIGH (ref 70–99)
Glucose-Capillary: 198 mg/dL — ABNORMAL HIGH (ref 70–99)
Glucose-Capillary: 220 mg/dL — ABNORMAL HIGH (ref 70–99)

## 2018-12-28 LAB — PREPARE RBC (CROSSMATCH)

## 2018-12-28 MED ORDER — VANCOMYCIN HCL IN DEXTROSE 1-5 GM/200ML-% IV SOLN
1000.0000 mg | INTRAVENOUS | Status: DC
  Administered 2018-12-28 – 2018-12-30 (×3): 1000 mg via INTRAVENOUS
  Filled 2018-12-28 (×4): qty 200

## 2018-12-28 NOTE — Progress Notes (Signed)
OT Cancellation Note  Patient Details Name: [name] MRN: [id number] DOB: [birth year]   Cancelled Treatment:    Reason Eval/Treat Not Completed: Fatigue/lethargy limiting ability to participate. OT consult received, chart reviewed. Pt lethargic this pm, unable to participate in therapy at this time. Will re-attempt next date.   Jeni Salles, MPH, MS, OTR/L ascom [phone] 12/28/18, 2:10 PM

## 2018-12-28 NOTE — Progress Notes (Signed)
Pharmacy Antibiotic Note  [name] is a 84 y.o. male admitted on 12/20/2018 with necrotic diabetic right heel ulcer. Podiatry recommending amputation due to worsening necrosis despite angioplasty and debridement. Bone cx notable for Proteus mirabilis, ESBL, and pseudomonas.  Wound cx notable for MRSA and Pseudomonas. Pharmacy consulted for vancomycin and meropenem dosing.  Zosyn d/c 8/4 and meropenem started for ESBL coverage. 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 ID following.   No vancomycin dose administration charted 8/5. Appears that patient missed dose due to surgery.   Plan: Continue meropenem 1 g Q12H.     Vancomycin schedule time moved up today due to lapse in therapy  Continue Vancomycin 1000 mg IV Q 24 hrs. Goal AUC 400-550. Expected AUC: 464.1 Expected Cmin: 12.4 SCr used: 1.36  Continue to monitor Scr. Plan to obtain vancomycin level at steady state.   Height: 5\' 7"  (170.2 cm) Weight: 176 lb 2.4 oz (79.9 kg) IBW/kg (Calculated) : 66.1  Temp (24hrs), Avg:98.3 F (36.8 C), Min:97.6 F (36.4 C), Max:99.6 F (37.6 C)  Recent Labs  Lab 12/24/18 0537 12/25/18 0213 12/25/18 1128 12/26/18 0343 12/27/18 0328 12/28/18 0445  WBC 18.8* 19.1*  --  13.5* 13.6* 16.1*  CREATININE 1.61* 1.53*  --  1.38* 1.28* 1.36*  VANCORANDOM  --   --  10  --   --   --     Estimated Creatinine Clearance: 43.1 mL/min (A) (by C-G formula based on SCr of 1.36 mg/dL (H)).    Allergies  Allergen Reactions  . Contrast Media [Iodinated Diagnostic Agents] Shortness Of Breath    SOB  . Iohexol Shortness Of Breath     Desc: Respiratory Distress, Laryngedema, diaphoresis, Onset Date: [id number]   . Metrizamide Shortness Of Breath    SOB SOB SOB   . Latex Rash    Antimicrobials this admission: Zosyn (7/29) >> (8/4) D/C'd Vanc (8/2) >>  Meropenem (8/4) >>  Dose adjustments this admission: None  Microbiology results: 8/1 Bone cx: Few Proteus mirabilis, ESBL, pseudomonas 8/1 Wound right  heel: MRSA 7/29 Wound cx: pseudomonas and MRSA  7/29 1/4 BCx: Proteus mirabilis    Thank you for allowing pharmacy to be a part of this patient's care.  Benita Gutter, PharmD Pharmacy Resident  12/28/2018 11:16 AM

## 2018-12-28 NOTE — Progress Notes (Signed)
[facility] NAME: [name]    MR#:  [id number]  DATE OF BIRTH:  March 04, 1935  SUBJECTIVE:  CHIEF COMPLAINT:   Chief Complaint  Patient presents with  . Shortness of Breath   Patient awake and alert and oriented this morning.  Patient status post right BKA yesterday.  No fevers.  No pains. REVIEW OF SYSTEMS:  Review of Systems  Constitutional: Negative for chills and fever.  HENT: Negative for hearing loss and tinnitus.   Eyes: Negative for blurred vision and double vision.  Respiratory: Negative for cough and shortness of breath.   Cardiovascular: Negative for chest pain and palpitations.  Gastrointestinal: Negative for abdominal pain, heartburn, nausea and vomiting.  Genitourinary: Negative for dysuria and urgency.  Musculoskeletal: Negative for neck pain.  Skin: Negative for itching and rash.  Neurological: Negative for dizziness and headaches.  Psychiatric/Behavioral: Negative for depression and hallucinations.      DRUG ALLERGIES:   Allergies  Allergen Reactions  . Contrast Media [Iodinated Diagnostic Agents] Shortness Of Breath    SOB  . Iohexol Shortness Of Breath     Desc: Respiratory Distress, Laryngedema, diaphoresis, Onset Date: [id number]   . Metrizamide Shortness Of Breath    SOB SOB SOB   . Latex Rash   VITALS:  Blood pressure (!) 182/69, pulse 77, temperature 99.6 F (37.6 C), temperature source Oral, resp. rate 18, height 5\' 7"  (1.702 m), weight 79.9 kg, SpO2 98 %. PHYSICAL EXAMINATION:  Physical Exam  GENERAL:84 y.o.-year-old patient lying in the bed, feels miserable secondary to right foot pain EYES: Pupils equal, round, reactive to light and accommodation. No scleral icterus. Extraocular muscles intact.  HEENT: Head atraumatic, normocephalic. Oropharynx and nasopharynx clear.  NECK: Supple, no jugular venous distention. No thyroid enlargement, no tenderness.  LUNGS: Normal breath  sounds bilaterally,bibasilar crackles,no wheezing, rhonchi or crepitation. No use of accessory muscles of respiration.  CARDIOVASCULAR: S1, S2 normal. No rubs, or gallops. 3/6 systolic murmur present ABDOMEN: Soft, nontender, nondistended. Bowel sounds present. No organomegaly or mass.  EXTREMITIES:s/p left BKA previously and now status post recent right BKA on 12/27/2018. NEUROLOGIC: Awake and alert and oriented.  No focal deficit.   PSYCHIATRIC: Patient awake and alert and oriented.  Normal mood.   SKIN: No obvious rash, lesion.  Dressing in place to right foot.  LABORATORY PANEL:  Male CBC Recent Labs  Lab 12/28/18 0445  WBC 16.1*  HGB 9.7*  HCT 30.5*  PLT 386   ------------------------------------------------------------------------------------------------------------------ Chemistries  Recent Labs  Lab 12/28/18 0445  NA 136  K 4.5  CL 103  CO2 26  GLUCOSE 241*  BUN 22  CREATININE 1.36*  CALCIUM 8.0*  MG 2.1   RADIOLOGY:  No results found. ASSESSMENT AND PLAN:   [name] a83 y.o.malewith a known history of atrial fibrillation on Xarelto, CAD, ischemic cardiomyopathy with EF of 35 to 40%, hypertension, peripheral vascular disease status post left BKA and right leg angioplasty 2 days ago, obstructive sleep apnea on CPAP presented to hospital secondary to confusion and worsening shortness of breath   1. Acute hypoxic respiratory failure-secondary to acute pulmonary edema. -Acute on chronic systolic heart failure. Last cardiac cath was in January 2020 showing chronic occlusions and collaterals from LAD. Medical management recommended. EF was around 35%. -Patient was admitted on high flow nasal cannula, much improved with IV diuresis. -Renal function slowly improving  -Currently on 2 L oxygen which is acute.  Wean off oxygen as  tolerated.   Off Lasix due to recent acute kidney injury.  2. Sepsis-present on admission.  Secondary to Diabetic right  heel ulcer-appears necrotic, ischemic ulcer. Status post recent revascularization and arterial stents placement on 12/18/2018. -Appreciate ID, podiatry and vascular consult. -Appreciate podiatry, ID and vascular consult. .  Blood cultures growing Proteus mirabilis.  Wound cultures so far growing Pseudomonas and MRSA.  IV Zosyn previously changed to meropenem.  Patient also on vancomycin. -MRI of the ankle -showing large wound in the posterior aspect of the heel, septic Achilles tendinopathy and possible abscess and pre-Achilles region with osteomyelitis of the calcaneus with cellulitis and mild fasciitis. Patient status post debridement in the OR on 12/23/2018 and wound VAC was applied.  However due to progression of gangrene patient subsequently had right BKA done on 12/27/2018   3. Uncontrolled diabetes mellitus- A1c is 8.9,  Patient said to have had episode of hypoglycemia recently and Lantus insulin decreased from 12 to 8 units nightly to prevent recurrent hypoglycemia.  We will add pre-meal NovoLog insulin once patient has consistent p.o. intake.  Continue sliding scale insulin coverage and monitor.  4. A. fib-rate controlled. Continue cardiac medications. Xarelto resumed.  5. CKD stage III-stable. Continue to monitor  6. Acute kidney injury. Renal function fairly stable  7.  Confusion-secondary to acute metabolic encephalopathy from underlying infection and pain medications. Significantly improved.  Patient awake and alert and oriented this morning.  DVT prophylaxis-patient back on Xarelto Follow-up on physical therapy evaluation and recommendation Wheelchair-bound with walker transfers at baseline  All the records are reviewed and case discussed with Care Management/Social Worker. Management plans discussed with the patient, family and they are in agreement. I called patient's wife today  and updated her on treatment plans Patient would likely require skilled nursing  facility placement on discharge.  CODE STATUS: Full Code  TOTAL TIME TAKING CARE OF THIS PATIENT: 35 minutes.   More than 50% of the time was spent in counseling/coordination of care: YES  POSSIBLE D/C IN 2 DAYS, DEPENDING ON CLINICAL CONDITION.   [name] M.D on 12/28/2018 at 2:07 PM  Between 7am to 6pm - Pager - [phone]  After 6pm go to [email or web address] - Proofreader  [facility]  Office  [phone]  CC: Primary care physician; Corey Skains, MD  Note: This dictation was prepared with Dragon dictation along with smaller phrase technology. Any transcriptional errors that result from this process are unintentional.

## 2018-12-28 NOTE — Progress Notes (Signed)
[facility] Daily Progress Note   Subjective: 12/27/18: Right below-the-knee amputation  12/18/18: 1.Ultrasound guidance for vascular accessleftfemoral artery 2.Catheter placement into right common femoral artery from left femoral approach 3.Aortogram and selectiverightlower extremity angiogram 4.Percutaneous transluminal angioplasty ofright anterior tibial artery with 3 mm diameter angioplasty balloon 5.Percutaneous transluminal angioplasty of the right SFA and proximal popliteal artery with a 5 mm diameter by 30 and a 5 mm diameter by 10 cm length Lutonix drug-coated angioplasty balloon 6.Percutaneous transluminal angioplasty of the right tibioperoneal trunk and proximal peroneal artery with 3 mm diameter angioplasty balloon 7.Viabahn stent placement to the right mid to distal SFA and proximal popliteal artery with a 6 mm diameter by 25 cm length stent 8.StarClose closure device leftfemoral artery  Patient very lethargic this afternoon.  No issues overnight.  Objective: Vitals:   12/27/18 1802 12/27/18 2326 12/27/18 2329 12/28/18 0834  BP: (!) 184/86 (!) 169/111 (!) 147/54 (!) 182/69  Pulse: (!) 54 95 70 77  Resp: 16  18 18   Temp:   98 F (36.7 C) 99.6 F (37.6 C)  TempSrc:    Oral  SpO2: 98% 99% 97% 98%  Weight:      Height:        Intake/Output Summary (Last 24 hours) at 12/28/2018 1229 Last data filed at 12/28/2018 0529 Gross per 24 hour  Intake 246.69 ml  Output 100 ml  Net 146.69 ml    Physical Exam: Lethargic, NAD CV: Irregularly irregular Pulmonary: CTA Bilaterally Abdomen: Soft, Nontender, Nondistended Vascular:  Right lower extremity: Thigh soft.  Dressing with some bloody discharge.   Laboratory: CBC    Component Value Date/Time   WBC 16.1 (H) 12/28/2018 0445   HGB 9.7 (L) 12/28/2018 0445   HGB 16.3 01/20/2013 0255   HCT 30.5 (L) 12/28/2018 0445   HCT 47.9 01/20/2013 0255   PLT 386 12/28/2018 0445   PLT 245 01/20/2013 0255   BMET    Component Value Date/Time   NA 136 12/28/2018 0445   NA 140 01/20/2013 0255   K 4.5 12/28/2018 0445   K 3.5 01/20/2013 0255   CL 103 12/28/2018 0445   CL 103 01/20/2013 0255   CO2 26 12/28/2018 0445   CO2 30 01/20/2013 0255   GLUCOSE 241 (H) 12/28/2018 0445   GLUCOSE 145 (H) 01/20/2013 0255   BUN 22 12/28/2018 0445   BUN 22 (H) 01/20/2013 0255   CREATININE 1.36 (H) 12/28/2018 0445   CREATININE 1.09 01/20/2013 0255   CALCIUM 8.0 (L) 12/28/2018 0445   CALCIUM 9.4 01/20/2013 0255   GFRNONAA 48 (L) 12/28/2018 0445   GFRNONAA >60 01/20/2013 0255   GFRAA 56 (L) 12/28/2018 0445   GFRAA >60 01/20/2013 0255   Assessment/Planning: The patient is an 84 year old male with multiple medical issues including peripheral artery disease status post a right BKA postop day 1 1) patient very lethargic this afternoon.   2) physical exam with some bloody discharge in OR dressing.  Drop in hemoglobin to 9.7 however patient seems asymptomatic.  CBC in a.m. 3) PT/OT  Discussed with Dr. Ellis Parents [facility] PA-C 12/28/2018 12:29 PM

## 2018-12-28 NOTE — Progress Notes (Addendum)
PT Cancellation Note  Patient Details Name: [name] MRN: [id number] DOB: 1934/05/29   Cancelled Treatment:    Reason Eval/Treat Not Completed: Other (comment). Evaluation attempted, however pt very lethargic, hard to arouse. Only able to stay awake for a few seconds prior to falling back asleep. Unable to participate in therapy this morning. Will re-attempt.  Addendum: 1340: Re-attempt made, however pt very lethargic. Lunch tray in room, untouched, and unable to participate in therapy evaluation. Will re-attempt next date.   Labrittany Wechter 12/28/2018, 10:58 AM  Greggory Stallion, PT, DPT [phone]

## 2018-12-29 ENCOUNTER — Ambulatory Visit: Payer: Medicare Other | Admitting: Family

## 2018-12-29 DIAGNOSIS — L089 Local infection of the skin and subcutaneous tissue, unspecified: Secondary | ICD-10-CM

## 2018-12-29 DIAGNOSIS — B9620 Unspecified Escherichia coli [E. coli] as the cause of diseases classified elsewhere: Secondary | ICD-10-CM

## 2018-12-29 DIAGNOSIS — B9562 Methicillin resistant Staphylococcus aureus infection as the cause of diseases classified elsewhere: Secondary | ICD-10-CM

## 2018-12-29 DIAGNOSIS — Z1612 Extended spectrum beta lactamase (ESBL) resistance: Secondary | ICD-10-CM

## 2018-12-29 DIAGNOSIS — Z89511 Acquired absence of right leg below knee: Secondary | ICD-10-CM

## 2018-12-29 LAB — BASIC METABOLIC PANEL
Anion gap: 5 (ref 5–15)
BUN: 20 mg/dL (ref 8–23)
CO2: 27 mmol/L (ref 22–32)
Calcium: 7.9 mg/dL — ABNORMAL LOW (ref 8.9–10.3)
Chloride: 104 mmol/L (ref 98–111)
Creatinine, Ser: 1.2 mg/dL (ref 0.61–1.24)
GFR calc Af Amer: >60 mL/min (ref >60)
GFR calc non Af Amer: 56 mL/min — ABNORMAL LOW (ref >60)
Glucose, Bld: 159 mg/dL — ABNORMAL HIGH (ref 70–99)
Potassium: 4.5 mmol/L (ref 3.5–5.1)
Sodium: 136 mmol/L (ref 135–145)

## 2018-12-29 LAB — CBC
HCT: 25.5 % — ABNORMAL LOW (ref 39.0–52.0)
Hemoglobin: 8.2 g/dL — ABNORMAL LOW (ref 13.0–17.0)
MCH: 28.7 pg (ref 26.0–34.0)
MCHC: 32.2 g/dL (ref 30.0–36.0)
MCV: 89.2 fL (ref 80.0–100.0)
Platelets: 354 10*3/uL (ref 150–400)
RBC: 2.86 MIL/uL — ABNORMAL LOW (ref 4.22–5.81)
RDW: 14.5 % (ref 11.5–15.5)
WBC: 19 10*3/uL — ABNORMAL HIGH (ref 4.0–10.5)
nRBC: 0 % (ref 0.0–0.2)

## 2018-12-29 LAB — GLUCOSE, CAPILLARY
Glucose-Capillary: 142 mg/dL — ABNORMAL HIGH (ref 70–99)
Glucose-Capillary: 156 mg/dL — ABNORMAL HIGH (ref 70–99)
Glucose-Capillary: 199 mg/dL — ABNORMAL HIGH (ref 70–99)
Glucose-Capillary: 211 mg/dL — ABNORMAL HIGH (ref 70–99)
Glucose-Capillary: 216 mg/dL — ABNORMAL HIGH (ref 70–99)
Glucose-Capillary: 231 mg/dL — ABNORMAL HIGH (ref 70–99)

## 2018-12-29 LAB — SURGICAL PATHOLOGY

## 2018-12-29 LAB — MAGNESIUM: Magnesium: 2 mg/dL (ref 1.7–2.4)

## 2018-12-29 MED ORDER — ENSURE MAX PROTEIN PO LIQD
11.0000 [oz_av] | Freq: Two times a day (BID) | ORAL | Status: DC
  Administered 2018-12-29 – 2019-01-01 (×8): 11 [oz_av] via ORAL
  Filled 2018-12-29: qty 330

## 2018-12-29 MED ORDER — ADULT MULTIVITAMIN W/MINERALS CH
1.0000 | ORAL_TABLET | Freq: Every day | ORAL | Status: DC
  Administered 2018-12-30 – 2019-01-03 (×4): 1 via ORAL
  Filled 2018-12-29 (×4): qty 1

## 2018-12-29 MED ORDER — VITAMIN C 500 MG PO TABS
250.0000 mg | ORAL_TABLET | Freq: Two times a day (BID) | ORAL | Status: DC
  Administered 2018-12-29 – 2019-01-04 (×11): 250 mg via ORAL
  Filled 2018-12-29 (×11): qty 1

## 2018-12-29 NOTE — Progress Notes (Signed)
OT Cancellation Note  Patient Details Name: [name] MRN: [id number] DOB: 1935-04-22   Cancelled Treatment:    Reason Eval/Treat Not Completed: Pain limiting ability to participate. Consult received, chart reviewed. Upon attempt, pt holding R residual limb, grimacing, and complaining of severe pain in his stump. Pt noted to be somewhat confused, very HOH. Dressing noted to be seeping onto linens. RN notified of pain/residual limb bandage/seeping. Will re-attempt at later date/time as pt is medically appropriate and able to participate.   Jeni Salles, MPH, MS, OTR/L ascom [phone] 12/29/18, 9:51 AM

## 2018-12-29 NOTE — Anesthesia Postprocedure Evaluation (Signed)
Anesthesia Post Note  Patient: [name]  Procedure(s) Performed: AMPUTATION BELOW KNEE (Right )  Patient location during evaluation: PACU Anesthesia Type: General Level of consciousness: awake and alert Pain management: pain level controlled Vital Signs Assessment: post-procedure vital signs reviewed and stable Respiratory status: spontaneous breathing, nonlabored ventilation and respiratory function stable Cardiovascular status: blood pressure returned to baseline and stable Postop Assessment: no apparent nausea or vomiting Anesthetic complications: no     Last Vitals:  Vitals:   12/28/18 2153 12/29/18 0726  BP: 122/62 (!) 140/51  Pulse: 76 67  Resp: 20   Temp: 36.6 C 36.5 C  SpO2: 99% 100%    Last Pain:  Vitals:   12/29/18 0726  TempSrc: Oral  PainSc:                  Durenda Hurt

## 2018-12-29 NOTE — Progress Notes (Signed)
Initial Nutrition Assessment  DOCUMENTATION CODES:   Not applicable  INTERVENTION:   Ensure Max protein supplement BID, each supplement provides 150kcal and 30g of protein.  MVI daily   Vitamin C 250mg  po BID  2g sodium diet   NUTRITION DIAGNOSIS:   Increased nutrient needs related to wound healing as evidenced by increased estimated needs.  GOAL:   Patient will meet greater than or equal to 90% of their needs  MONITOR:   PO intake, Supplement acceptance, Labs, Weight trends, Skin, I & O's  REASON FOR ASSESSMENT:   LOS    ASSESSMENT:   84 y.o. 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 Pt currently eating 100% of meals in hospital. RD will add supplements and vitamins to support wound healing. Per chart, pt is weight stable pta. No new weight since admit; will request weekly weights.   Medications reviewed and include: aspirin, ferrous sulfate, insulin, miralax, meropenem, vancomycin   Labs reviewed: wbc- 19.0(H), Hgb 8.2(L), Hct 25.5(L) cbgs- 156, 142, 231 x 24 hr AIC 8.9(H)- 7/29  Unable to complete Nutrition-Focused physical exam at this time as pt on contact precautions.   Diet Order:   Diet Order            Diet Heart Room service appropriate? Yes; Fluid consistency: Thin  Diet effective now             EDUCATION NEEDS:   Not appropriate for education at this time  Skin:  Skin Assessment: Reviewed RN Assessment(Stage II buttocks, incision L leg s/p BKA)  Last BM:  8/4  Height:   Ht Readings from Last 1 Encounters:  12/20/18 5\' 7"  (1.702 m)    Weight:   Wt Readings from Last 1 Encounters:  12/20/18 79.9 kg    Ideal Body Weight:  59 kg(adjusted for BL BKA)  BMI:  Body mass index is 27.59 kg/m.  Estimated  Nutritional Needs:   Kcal:  1700-1900kcal/day  Protein:  85-95g/day  Fluid:  >1.7L/day  Koleen Distance MS, RD, LDN Pager #- [phone] Office#- [phone] After Hours Pager: [phone]

## 2018-12-29 NOTE — Progress Notes (Signed)
Pharmacy Antibiotic Note  [name] is a 84 y.o. male admitted on 12/20/2018 with necrotic diabetic right heel ulcer. Podiatry recommending amputation due to worsening necrosis despite angioplasty and debridement. Bone cx notable for Proteus mirabilis, ESBL, and pseudomonas.  Wound cx notable for MRSA and Pseudomonas. Pharmacy consulted for vancomycin and meropenem dosing.  Zosyn d/c 8/4 and meropenem started for ESBL coverage. 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 ID following.   No vancomycin dose administration charted 8/5. Appears that patient missed dose due to surgery.  If patient continues to improve tentative plan is to de-escalate antibiotics on 8/8.    Plan: Continue meropenem 1 g Q12H.     Continue Vancomycin 1000 mg IV Q 24 hrs. Goal AUC 400-550. Expected AUC: 415.2 Expected Cmin: 10.5 SCr used: 1.20  Continue to monitor Scr. Will plan to obtain vancomycin level on 8/9 if therapy is continued.   Height: 5\' 7"  (170.2 cm) Weight: 176 lb 2.4 oz (79.9 kg) IBW/kg (Calculated) : 66.1  Temp (24hrs), Avg:98.3 F (36.8 C), Min:97.7 F (36.5 C), Max:99.4 F (37.4 C)  Recent Labs  Lab 12/25/18 0213 12/25/18 1128 12/26/18 0343 12/27/18 0328 12/28/18 0445 12/29/18 0404  WBC 19.1*  --  13.5* 13.6* 16.1* 19.0*  CREATININE 1.53*  --  1.38* 1.28* 1.36* 1.20  VANCORANDOM  --  10  --   --   --   --     Estimated Creatinine Clearance: 48.9 mL/min (by C-G formula based on SCr of 1.2 mg/dL).    Allergies  Allergen Reactions  . Contrast Media [Iodinated Diagnostic Agents] Shortness Of Breath    SOB  . Iohexol Shortness Of Breath     Desc: Respiratory Distress, Laryngedema, diaphoresis, Onset Date: [id number]   . Metrizamide Shortness Of Breath    SOB SOB SOB   . Latex Rash    Antimicrobials this admission: Zosyn (7/29) >> (8/4) D/C'd Vanc (8/2) >>  Meropenem (8/4) >>  Dose adjustments this admission: None  Microbiology results: 8/1 Bone cx: Few Proteus mirabilis,  ESBL, pseudomonas 8/1 Wound right heel: MRSA 7/29 Wound cx: pseudomonas and MRSA  7/29 1/4 BCx: Proteus mirabilis    Thank you for allowing pharmacy to be a part of this patient's care.  Benita Gutter, PharmD Pharmacy Resident  12/29/2018 12:55 PM

## 2018-12-29 NOTE — Evaluation (Signed)
Physical Therapy Evaluation Patient Details Name: [name] MRN: [id number] DOB: 1934-10-05 Today's Date: 12/29/2018   History of Present Illness  Pt admitted for ARF with complaints of SOB and non healing R heel wound. Pt has had complicated hospital stay and ended up requiring R BKA secondary to gangrene on 12/27/18. PMH includes Afib, CAD, cardiomyopathy, and HTN.  Clinical Impression  Pt is a pleasant 84 year old male who was admitted for ARF but stay complicated by new R BKA secondary to gangrene. Pt is having severe pain at this time, will hardly allow therapist to touch LE. Pt unable to tolerate bed mobility this date. Education provided on residual limb there-ex and positioning. Doesn't have L LE prosthetic in room, would benefit if wife could bring in to perform mobility. Pt demonstrates deficits with R LE strength/endurance/pain. Would benefit from skilled PT to address above deficits and promote optimal return to PLOF; recommend transition to STR upon discharge from acute hospitalization.     Follow Up Recommendations SNF    Equipment Recommendations  None recommended by PT    Recommendations for Other Services       Precautions / Restrictions Precautions Precautions: Fall Restrictions Weight Bearing Restrictions: No RLE Weight Bearing: Non weight bearing LLE Weight Bearing: Non weight bearing      Mobility  Bed Mobility               General bed mobility comments: unable to tolerate any bed mobility this date  Transfers                 General transfer comment: unable  Ambulation/Gait                Stairs            Wheelchair Mobility    Modified Rankin (Stroke Patients Only)       Balance                                             Pertinent Vitals/Pain Pain Assessment: 0-10 Pain Score: 10-Worst pain ever Pain Location: R LE incision Pain Descriptors / Indicators: Operative site  guarding Pain Intervention(s): Limited activity within patient's tolerance;Premedicated before session    Home Living Family/patient expects to be discharged to:: Private residence Living Arrangements: Spouse/significant other Available Help at Discharge: Family Type of Home: House Home Access: Ramped entrance     Home Layout: One level Home Equipment: [place] - single point;Walker - 2 wheels;Grab bars - toilet;Wheelchair - manual;Shower seat      Prior Function Level of Independence: Independent with assistive device(s)         Comments: was able to walk with L prosthetic limb using RW "unlimited distance" at home. Reports 1 fall in past 6 months     Hand Dominance        Extremity/Trunk Assessment   Upper Extremity Assessment Upper Extremity Assessment: Overall WFL for tasks assessed    Lower Extremity Assessment Lower Extremity Assessment: Generalized weakness(R LE grossly 2/5; L LE grossly 5/5)       Communication   Communication: HOH  Cognition Arousal/Alertness: Awake/alert Behavior During Therapy: WFL for tasks assessed/performed Overall Cognitive Status: Within Functional Limits for tasks assessed  General Comments General comments (skin integrity, edema, etc.): Pt keeps R knee in flexed position, lacking 56 degrees of extension    Exercises Other Exercises Other Exercises: provided education on residual limb management and positioning. R LE presents with drainage. Other Exercises: S[place] ther-ex performed on R LE including quad sets, SLRs, and hip abd/add. Pt uses hands to assist with ther-ex secondary to pain. Needs max assist for completion   Assessment/Plan    PT Assessment Patient needs continued PT services  PT Problem List Decreased strength;Decreased mobility;Decreased range of motion;Pain       PT Treatment Interventions DME instruction;Gait training;Therapeutic exercise;Wheelchair  mobility training    PT Goals (Current goals can be found in the Care Plan section)  Acute Rehab PT Goals Patient Stated Goal: to get stronger PT Goal Formulation: With patient Time For Goal Achievement: 01/12/19 Potential to Achieve Goals: Good    Frequency 7X/week   Barriers to discharge        Co-evaluation               AM-PAC PT "6 Clicks" Mobility  Outcome Measure Help needed turning from your back to your side while in a flat bed without using bedrails?: Total Help needed moving from lying on your back to sitting on the side of a flat bed without using bedrails?: Total Help needed moving to and from a bed to a chair (including a wheelchair)?: Total Help needed standing up from a chair using your arms (e.g., wheelchair or bedside chair)?: Total Help needed to walk in hospital room?: Total Help needed climbing 3-5 steps with a railing? : Total 6 Click Score: 6    End of Session   Activity Tolerance: Patient limited by pain Patient left: in bed;with bed alarm set Nurse Communication: Mobility status PT Visit Diagnosis: Muscle weakness (generalized) (M62.81);Difficulty in walking, not elsewhere classified (R26.2);Pain Pain - Right/Left: Right Pain - part of body: Leg    Time: 1026-1040 PT Time Calculation (min) (ACUTE ONLY): 14 min   Charges:   PT Evaluation $PT Eval Moderate Complexity: [place], PT, DPT [phone]   Saydie Gerdts 12/29/2018, 1:09 PM

## 2018-12-29 NOTE — TOC Progression Note (Signed)
Transition of Care [facility]) - Progression Note    Patient Details  Name: [name] MRN: [id number] Date of Birth: 1935-04-04  Transition of [facility]) CM/SW [place], RN Phone Number: 12/29/2018, 3:05 PM  Clinical Narrative:     Met with the patient and his wife.  We reviewed the SNF process, they agreed to start the bed search, FL2, PASSR and bedsearch completed.  They prefer that the patient go to [facility] due to them living in [place] I notified [facility] of the preference.  Once bed offers are obtained, review with the patient and his wife       Expected Discharge Plan and Services                                                 Social Determinants of Health (SDOH) Interventions    Readmission Risk Interventions Readmission Risk Prevention Plan 12/25/2018 12/23/2018  Transportation Screening Complete Complete  PCP or Specialist Appt within 3-5 Days Complete -  HRI or [place] Complete Complete  Social Work Consult for [place] Planning/Counseling Complete -  Palliative Care Screening Not Applicable -  Medication Review Press photographer) Complete Complete  Some recent data might be hidden

## 2018-12-29 NOTE — Progress Notes (Signed)
[facility] Daily Progress Note   Subjective: 12/27/18: Right below-the-knee amputation  12/18/18: 1.Ultrasound guidance for vascular accessleftfemoral artery 2.Catheter placement into right common femoral artery from left femoral approach 3.Aortogram and selectiverightlower extremity angiogram 4.Percutaneous transluminal angioplasty ofright anterior tibial artery with 3 mm diameter angioplasty balloon 5.Percutaneous transluminal angioplasty of the right SFA and proximal popliteal artery with a 5 mm diameter by 30 and a 5 mm diameter by 10 cm length Lutonix drug-coated angioplasty balloon 6.Percutaneous transluminal angioplasty of the right tibioperoneal trunk and proximal peroneal artery with 3 mm diameter angioplasty balloon 7.Viabahn stent placement to the right mid to distal SFA and proximal popliteal artery with a 6 mm diameter by 25 cm length stent 8.StarClose closure device leftfemoral artery  Patient complaining of stump pain this AM.  Objective: Vitals:   12/28/18 0834 12/28/18 1704 12/28/18 2153 12/29/18 0726  BP: (!) 182/69 (!) 171/66 122/62 (!) 140/51  Pulse: 77 72 76 67  Resp: 18 18 20    Temp: 99.6 F (37.6 C) 99.4 F (37.4 C) 97.9 F (36.6 C) 97.7 F (36.5 C)  TempSrc: Oral Oral  Oral  SpO2: 98% 97% 99% 100%  Weight:      Height:        Intake/Output Summary (Last 24 hours) at 12/29/2018 1113 Last data filed at 12/28/2018 1500 Gross per 24 hour  Intake 688.67 ml  Output -  Net 688.67 ml   Physical Exam: A&Ox3, NAD CV: Irregularly irregular Pulmonary: CTA Bilaterally Abdomen: Soft, Nontender, Nondistended Vascular:  Right Lower Extremity: Thigh soft.  OR dressing removed.  Staples clean dry and intact.  Stump is warm and well perfused.  Small amount of oozing from the lateral aspect bloody nonpurulent discharge.     Document Information Photos    12/29/2018 10:18  Attached To:  Hospital Encounter on 12/20/18  Source Information Ladena Jacquez, Janalyn Harder, PA-C  [facility] (1a)    Laboratory: CBC    Component Value Date/Time   WBC 19.0 (H) 12/29/2018 0404   HGB 8.2 (L) 12/29/2018 0404   HGB 16.3 01/20/2013 0255   HCT 25.5 (L) 12/29/2018 0404   HCT 47.9 01/20/2013 0255   PLT 354 12/29/2018 0404   PLT 245 01/20/2013 0255   BMET    Component Value Date/Time   NA 136 12/29/2018 0404   NA 140 01/20/2013 0255   K 4.5 12/29/2018 0404   K 3.5 01/20/2013 0255   CL 104 12/29/2018 0404   CL 103 01/20/2013 0255   CO2 27 12/29/2018 0404   CO2 30 01/20/2013 0255   GLUCOSE 159 (H) 12/29/2018 0404   GLUCOSE 145 (H) 01/20/2013 0255   BUN 20 12/29/2018 0404   BUN 22 (H) 01/20/2013 0255   CREATININE 1.20 12/29/2018 0404   CREATININE 1.09 01/20/2013 0255   CALCIUM 7.9 (L) 12/29/2018 0404   CALCIUM 9.4 01/20/2013 0255   GFRNONAA 56 (L) 12/29/2018 0404   GFRNONAA >60 01/20/2013 0255   GFRAA >60 12/29/2018 0404   GFRAA >60 01/20/2013 0255   Assessment/Planning: The patient is an 84 year old male with multiple medical issues including peripheral artery disease status post a right lower extremity endovascular intervention on December 18, 2018 and postop day 2 from a right below the knee amputation 1) patient with increasing white blood cell count so our dressing taken off on postop day 2.  Stump appears to be healthy and warm and perfused.  Small amounts of bloody nonpurulent drainage from the lateral aspect.  Staples are clean  dry and intact.  New dressing applied. 2) on aspirin and Xarelto 3) PT/OT  Discussed with Dr. Ellis Parents Apryle Stowell PA-C 12/29/2018 11:13 AM

## 2018-12-29 NOTE — Evaluation (Signed)
Occupational Therapy Evaluation Patient Details Name: [name] MRN: [id number] DOB: June 03, 1934 Today's Date: 12/29/2018    History of Present Illness Pt admitted for ARF with complaints of SOB and non healing R heel wound. Pt has had complicated hospital stay and ended up requiring R BKA secondary to gangrene on 12/27/18. PMH includes Afib, CAD, cardiomyopathy, HTN, L BKA.   Clinical Impression   Pt seen for OT evaluation this date, POD#2 from above surgery. Pt is eager to maximize independence and minimize pain control. Pt currently requires mod-max assist for toileting and LB dressing from bed level and significantly limited due to residual limb pain and demonstrates decreased knee extension. Pt instructed in desensitization strategies for his residual limb, importance of positioning, and continued attempts at knee extension to maximize optimal positioning and ROM for future prosthetic use. Pt verbalized understanding. Pt would benefit from skilled OT services including additional instruction in techniques with or without assistive devices for dressing and bathing skills and functional ADL transfers to support recall and carryover prior to discharge and ultimately to maximize safety, independence, and minimize falls risk and caregiver burden. Recommending SNF to support recovery.     Follow Up Recommendations  SNF    Equipment Recommendations  3 in 1 bedside commode    Recommendations for Other Services       Precautions / Restrictions Precautions Precautions: Fall Restrictions Weight Bearing Restrictions: Yes RLE Weight Bearing: Non weight bearing LLE Weight Bearing: Non weight bearing Other Position/Activity Restrictions: L and now R BKA      Mobility Bed Mobility               General bed mobility comments: unable to tolerate any bed mobility this date  Transfers                 General transfer comment: unable    Balance                                            ADL either performed or assessed with clinical judgement   ADL                                         General ADL Comments: Max A for bed level toileting, pt unable to tolerate attempts for bed mobility for seated ADL or slide board transfers, Mod A for LB dressing from bed level     Vision Patient Visual Report: No change from baseline Vision Assessment?: No apparent visual deficits     Perception     Praxis      Pertinent Vitals/Pain Pain Assessment: Faces Pain Score: 10-Worst pain ever Faces Pain Scale: Hurts worst Pain Location: R residual limb with any movement Pain Descriptors / Indicators: Operative site guarding;Moaning;Grimacing;Guarding Pain Intervention(s): Limited activity within patient's tolerance;Monitored during session;Repositioned     Hand Dominance Right   Extremity/Trunk Assessment Upper Extremity Assessment Upper Extremity Assessment: Overall WFL for tasks assessed   Lower Extremity Assessment Lower Extremity Assessment: Defer to PT evaluation(pt not bilat BKA, RLE limited by pain)   Cervical / Trunk Assessment Cervical / Trunk Assessment: Normal   Communication Communication Communication: HOH(at times appears to have difficulty with expressive language/"word salad")   Cognition Arousal/Alertness: Awake/alert Behavior During Therapy: WFL for tasks assessed/performed Overall  Cognitive Status: No family/caregiver present to determine baseline cognitive functioning                                 General Comments: Pt alert and follows commands with PRN cues (also HOH)   General Comments  Pt keeps R knee in flexed position, lacking 56 degrees of extension. Bandaging appears clean and intact, no drainage noted    Exercises Exercises: Other exercises Other Exercises Other Exercises: Pt instructed in desensitization strategies to support residual limb recovery and pain mgt and  educated in positioning of RLE including attempts at knee extension to minimize risk of contracture   Shoulder Instructions      Home Living Family/patient expects to be discharged to:: Private residence Living Arrangements: Spouse/significant other Available Help at Discharge: Family Type of Home: House Home Access: Ramped entrance     Home Layout: One level     Bathroom Shower/Tub: Occupational psychologist: Standard     Home Equipment: Cane - single point;Walker - 2 wheels;Grab bars - toilet;Wheelchair - manual;Shower seat          Prior Functioning/Environment Level of Independence: Independent with assistive device(s)        Comments: was able to walk with L prosthetic limb using RW "unlimited distance" at home. Reports 1 fall in past 6 months        OT Problem List: Decreased strength;Decreased range of motion;Pain;Decreased cognition      OT Treatment/Interventions: Self-care/ADL training;Therapeutic activities;Therapeutic exercise;DME and/or AE instruction;Patient/family education;Balance training    OT Goals(Current goals can be found in the care plan section) Acute Rehab OT Goals Patient Stated Goal: to get stronger OT Goal Formulation: With patient Time For Goal Achievement: 01/12/19 Potential to Achieve Goals: Good  OT Frequency: Min 2X/week   Barriers to D/C:            Co-evaluation              AM-PAC OT "6 Clicks" Daily Activity     Outcome Measure Help from another person eating meals?: None Help from another person taking care of personal grooming?: None Help from another person toileting, which includes using toliet, bedpan, or urinal?: A Lot Help from another person bathing (including washing, rinsing, drying)?: A Lot Help from another person to put on and taking off regular upper body clothing?: A Little Help from another person to put on and taking off regular lower body clothing?: A Lot 6 Click Score: 17   End of  Session    Activity Tolerance: Patient limited by pain Patient left: in bed;with call bell/phone within reach;with bed alarm set  OT Visit Diagnosis: Other abnormalities of gait and mobility (R26.89);Pain;Muscle weakness (generalized) (M62.81) Pain - Right/Left: Right Pain - part of body: Leg;Knee                Time: 7408-1448 OT Time Calculation (min): 13 min Charges:  OT General Charges $OT Visit: 1 Visit OT Evaluation $OT Eval Moderate Complexity: 1 Mod  Jeni Salles, MPH, MS, OTR/L ascom [phone] 12/29/18, 1:56 PM

## 2018-12-29 NOTE — Progress Notes (Signed)
ID Pt is more alert, communicative and has minimal pain He is eating  Patient Vitals for the past 24 hrs:  BP Temp Temp src Pulse Resp SpO2  12/29/18 0726 (!) 140/51 97.7 F (36.5 C) Oral 67 - 100 %  12/28/18 2153 122/62 97.9 F (36.6 C) - 76 20 99 %  12/28/18 1704 (!) 171/66 99.4 F (37.4 C) Oral 72 18 97 %    Rt BKA stump picture reviewed   Surgical site looks fine with no erythema, some bloody fluid seen oozing at one end  Impression/recommendation WBC was up today at 19K could be post procedure and also had angio.   proteus bacteremia Multiorganism infection of the rt necrotic ulcer- with pseudomonas, rare esbl e.coli and MRSA  Currently on Vanco amd meropenem  May be able to Dc the above if wbc improving tomorrow and de-escalate to zosyn for proteus Discussed with the care team

## 2018-12-29 NOTE — Progress Notes (Signed)
[name] at [place] NAME: [name]    MR#:  [id number]  DATE OF BIRTH:  1934/06/09  SUBJECTIVE:  CHIEF COMPLAINT:   Chief Complaint  Patient presents with  . Shortness of Breath   Patient awake and alert and oriented this morning.  Patient status post right BKA  .  No fevers.  No pains. REVIEW OF SYSTEMS:  Review of Systems  Constitutional: Negative for chills and fever.  HENT: Negative for hearing loss and tinnitus.   Eyes: Negative for blurred vision and double vision.  Respiratory: Negative for cough and shortness of breath.   Cardiovascular: Negative for chest pain and palpitations.  Gastrointestinal: Negative for abdominal pain, heartburn, nausea and vomiting.  Genitourinary: Negative for dysuria and urgency.  Musculoskeletal: Negative for neck pain.  Skin: Negative for itching and rash.  Neurological: Negative for dizziness and headaches.  Psychiatric/Behavioral: Negative for depression and hallucinations.     DRUG ALLERGIES:   Allergies  Allergen Reactions  . Contrast Media [Iodinated Diagnostic Agents] Shortness Of Breath    SOB  . Iohexol Shortness Of Breath     Desc: Respiratory Distress, Laryngedema, diaphoresis, Onset Date: [id number]   . Metrizamide Shortness Of Breath    SOB SOB SOB   . Latex Rash   VITALS:  Blood pressure (!) 140/51, pulse 67, temperature 97.7 F (36.5 C), temperature source Oral, resp. rate 20, height 5\' 7"  (1.702 m), weight 79.9 kg, SpO2 100 %. PHYSICAL EXAMINATION:  Physical Exam  GENERAL:84 y.o.-year-old patient lying in the bed, feels miserable secondary to right foot pain EYES: Pupils equal, round, reactive to light and accommodation. No scleral icterus. Extraocular muscles intact.  HEENT: Head atraumatic, normocephalic. Oropharynx and nasopharynx clear.  NECK: Supple, no jugular venous distention. No thyroid enlargement, no tenderness.  LUNGS: Normal breath sounds  bilaterally,bibasilar crackles,no wheezing, rhonchi or crepitation. No use of accessory muscles of respiration.  CARDIOVASCULAR: S1, S2 normal. No rubs, or gallops. 3/6 systolic murmur present ABDOMEN: Soft, nontender, nondistended. Bowel sounds present. No organomegaly or mass.  EXTREMITIES:s/p left BKA previously and now status post recent right BKA on 12/27/2018. NEUROLOGIC: Awake and alert and oriented.  No focal deficit.   PSYCHIATRIC: Patient awake and alert and oriented.  Normal mood.   SKIN: No obvious rash, lesion.  Dressing in place to right foot.  LABORATORY PANEL:  Male CBC Recent Labs  Lab 12/29/18 0404  WBC 19.0*  HGB 8.2*  HCT 25.5*  PLT 354   ------------------------------------------------------------------------------------------------------------------ Chemistries  Recent Labs  Lab 12/29/18 0404  NA 136  K 4.5  CL 104  CO2 27  GLUCOSE 159*  BUN 20  CREATININE 1.20  CALCIUM 7.9*  MG 2.0   RADIOLOGY:  No results found. ASSESSMENT AND PLAN:   [name] a84 y.o.malewith a known history of atrial fibrillation on Xarelto, CAD, ischemic cardiomyopathy with EF of 35 to 40%, hypertension, peripheral vascular disease status post left BKA and right leg angioplasty 2 days prior to this admission who presented to hospital secondary to confusion and worsening shortness of breath   1. Acute hypoxic respiratory failure-secondary to acute pulmonary edema. -Acute on chronic systolic heart failure. Last cardiac cath was in January 2020 showing chronic occlusions and collaterals from LAD. Medical management recommended. EF was around 35%. -Patient was admitted on high flow nasal cannula, much improved with IV diuresis. -Renal function slowly improving  -Currently on 2 L oxygen which is acute.  Wean off oxygen as tolerated.  Off Lasix due to recent acute kidney injury.  2. Sepsis-present on admission.  Secondary to Diabetic right heel ulcer-appears  necrotic, ischemic ulcer. Status post recent revascularization and arterial stents placement on 12/18/2018. -Appreciate ID, podiatry and vascular consult. -Appreciate podiatry, ID and vascular consult. .  Blood cultures growing Proteus mirabilis.  Wound cultures so far growing Pseudomonas and MRSA.  IV Zosyn previously changed to meropenem.  Patient also on vancomycin. -MRI of the ankle -showing large wound in the posterior aspect of the heel, septic Achilles tendinopathy and possible abscess and pre-Achilles region with osteomyelitis of the calcaneus with cellulitis and mild fasciitis. Patient status post debridement in the OR on 12/23/2018 and wound VAC was applied.  However due to progression of gangrene patient subsequently had right BKA done on 12/27/2018  ID following patient.  To continue broad-spectrum IV antibiotics for 1 more day due to evidence of leukocytosis today.  ID specialist planning on possibly de-escalating antibiotics in a.m.  3. Uncontrolled diabetes mellitus- A1c is 8.9,  Blood sugars fairly controlled on current regimen of Lantus insulin. To add pre-meal NovoLog insulin once patient has consistent p.o. intake.  Continue sliding scale insulin coverage and monitor.  4. A. fib-rate controlled. Continue cardiac medications. Xarelto resumed.  5. CKD stage III-stable. Continue to monitor  6. Acute kidney injury. Renal function fairly stable  7.  Acute metabolic encephalopathy from underlying infection and pain medications. Significantly improved.  Patient awake and alert and oriented this morning.  DVT prophylaxis-patient back on Xarelto Follow-up on physical therapy evaluation and recommendation Wheelchair-bound with walker transfers at baseline  All the records are reviewed and case discussed with Care Management/Social Worker. Management plans discussed with the patient, family and they are in agreement. I called patient's wife recently and updated her on  treatment plans Patient would likely require skilled nursing facility placement on discharge.  Physical therapy to evaluate  CODE STATUS: Full Code  TOTAL TIME TAKING CARE OF THIS PATIENT: 36 minutes.   More than 50% of the time was spent in counseling/coordination of care: YES  POSSIBLE D/C IN 3 DAYS, DEPENDING ON CLINICAL CONDITION.   [name] M.D on 12/29/2018 at 12:25 PM  Between 7am to 6pm - Pager - [phone]  After 6pm go to [email or web address] - Proofreader  [facility]  Office  [phone]  CC: Primary care physician; Corey Skains, MD  Note: This dictation was prepared with Dragon dictation along with smaller phrase technology. Any transcriptional errors that result from this process are unintentional.

## 2018-12-29 NOTE — NC FL2 (Signed)
[place] LEVEL OF CARE SCREENING TOOL     IDENTIFICATION  Patient Name: [name] Birthdate: 07/03/1934 Sex: male Admission Date (Current Location): 12/20/2018  [place] and [place] Number:  Engineering geologist and Address:  [facility], [address] [place], [place] [address]      Provider Number: [id number]  Attending Physician Name and Address:  Otila Back, MD  Relative Name and Phone Number:  Renford Dills [phone]    Current Level of Care: Hospital Recommended Level of Care: [place] Prior Approval Number:    Date Approved/Denied:   PASRR Number: [id number]  Discharge Plan: SNF    Current Diagnoses: Patient Active Problem List   Diagnosis Date Noted  . Sacral decubitus ulcer, stage II ([place]) 12/26/2018  . Acute respiratory failure ([place]) 12/20/2018  . Femur fracture ([place]) 10/13/2018  . Chest pain 06/03/2018  . NSTEMI (non-ST elevated myocardial infarction) ([place]) 01/16/2018  . Acute CHF (congestive heart failure) ([place]) 11/23/2017  . Atherosclerosis of native arteries of the extremities with ulceration ([place]) 11/18/2017  . Atrial fibrillation with rapid ventricular response ([place]) 10/15/2017  . BKA stump complication ([place]) 79/07/8331  . 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 S/P BKA (below knee amputation) unilateral, left ([place]) 02/01/2017  . Chronic diastolic heart failure ([place]) 01/21/2017  . Pressure injury of skin 01/18/2017  . Atherosclerosis of artery of extremity with gangrene ([place]) 01/05/2017  . Diabetic foot ulcer ([place]) 12/29/2016  . Ataxia 10/21/2016  . History of femoral angiogram 09/06/2016  . Left leg pain 09/06/2016  . Leukocytosis 09/06/2016  . Generalized weakness 09/06/2016  . Atrial fibrillation, chronic 08/30/2016  . Pneumonia 05/19/2016  . Hyperlipidemia 04/20/2016  . Back pain 04/19/2016  . Type 2 diabetes mellitus treated with insulin  ([place]) 04/19/2016  . Essential hypertension 04/19/2016  . PAD (peripheral artery disease) ([place]) 04/19/2016  . Erectile dysfunction following radical prostatectomy 06/25/2015  . History of prostate cancer 12/23/2014  . Incontinence 12/23/2014    Orientation RESPIRATION BLADDER Height & Weight     Self, Time, Situation, Place  Normal Continent Weight: 79.9 kg Height:  5\' 7"  (170.2 cm)  BEHAVIORAL SYMPTOMS/MOOD NEUROLOGICAL BOWEL NUTRITION STATUS      Continent    AMBULATORY STATUS COMMUNICATION OF NEEDS Skin   Extensive Assist(L- BKA) Verbally Normal, Surgical wounds                       Personal Care Assistance Level of Assistance  Bathing, Dressing Bathing Assistance: Limited assistance   Dressing Assistance: Limited assistance     Functional Limitations Info  Sight, Hearing, Speech Sight Info: Adequate Hearing Info: Adequate Speech Info: Adequate    SPECIAL CARE FACTORS FREQUENCY  PT (By licensed PT), OT (By licensed OT)     PT Frequency: 5 times per week OT Frequency: 5 times per week            Contractures Contractures Info: Not present    Additional Factors Info  Code Status, Allergies Code Status Info: [place] Allergies Info: Contrast Media Iodinated Diagnostic Agents, Iohexol, Metrizamide, Latex           Current Medications (12/29/2018):  This is the current hospital active medication list Current Facility-Administered Medications  Medication Dose Route Frequency Provider Last Rate Last Dose  . acetaminophen (TYLENOL) tablet 650 mg  650 mg Oral Q6H PRN Algernon Huxley, MD   650 mg at 12/29/18 [phone]  Or  . acetaminophen (TYLENOL) suppository 650 mg  650 mg Rectal Q6H PRN Algernon Huxley, MD      . amiodarone (PACERONE) tablet 200 mg  200 mg Oral Daily Algernon Huxley, MD   200 mg at 12/29/18 1112  . aspirin EC tablet 81 mg  81 mg Oral Daily Algernon Huxley, MD   81 mg at 12/29/18 1113  . atorvastatin (LIPITOR) tablet 80 mg  80 mg Oral QPM Algernon Huxley, MD   80  mg at 12/28/18 1743  . Chlorhexidine Gluconate Cloth 2 % PADS 6 each  6 each Topical Daily Algernon Huxley, MD   6 each at 12/29/18 1118  . dextrose 50 % solution 25 mL  25 mL Intravenous PRN Algernon Huxley, MD      . diltiazem (CARDIZEM CD) 24 hr capsule 120 mg  120 mg Oral Daily Algernon Huxley, MD   120 mg at 12/29/18 1113  . ferrous sulfate tablet 325 mg  325 mg Oral Q breakfast Algernon Huxley, MD   325 mg at 12/29/18 1113  . gabapentin (NEURONTIN) capsule 300 mg  300 mg Oral TID Algernon Huxley, MD   300 mg at 12/29/18 1113  . HYDROcodone-acetaminophen (NORCO/VICODIN) 5-325 MG per tablet 1-2 tablet  1-2 tablet Oral Q6H PRN Algernon Huxley, MD   1 tablet at 12/29/18 1230  . insulin aspart (novoLOG) injection 0-15 Units  0-15 Units Subcutaneous Q4H Algernon Huxley, MD   5 Units at 12/29/18 1232  . insulin glargine (LANTUS) injection 12 Units  12 Units Subcutaneous QHS Algernon Huxley, MD   12 Units at 12/28/18 2101  . MEDLINE mouth rinse  15 mL Mouth Rinse BID Algernon Huxley, MD   15 mL at 12/29/18 1232  . meropenem (MERREM) 1 g in sodium chloride 0.9 % 100 mL IVPB  1 g Intravenous Q12H Algernon Huxley, MD 200 mL/hr at 12/29/18 0509 1 g at 12/29/18 0509  . methocarbamol (ROBAXIN) tablet 500 mg  500 mg Oral Q6H PRN Algernon Huxley, MD   500 mg at 12/25/18 1657  . morphine 2 MG/ML injection 2 mg  2 mg Intravenous Q4H PRN Algernon Huxley, MD   2 mg at 12/27/18 1829  . [START ON 12/30/2018] multivitamin with minerals tablet 1 tablet  1 tablet Oral Daily Ojie, Jude, MD      . nitroGLYCERIN (NITROSTAT) SL tablet 0.4 mg  0.4 mg Sublingual Q5 min PRN Algernon Huxley, MD      . ondansetron (ZOFRAN) tablet 4 mg  4 mg Oral Q6H PRN Algernon Huxley, MD       Or  . ondansetron (ZOFRAN) injection 4 mg  4 mg Intravenous Q6H PRN Algernon Huxley, MD   4 mg at 12/27/18 1249  . polyethylene glycol (MIRALAX / GLYCOLAX) packet 17 g  17 g Oral Daily Algernon Huxley, MD   17 g at 12/29/18 1118  . protein supplement (ENSURE MAX) liquid  11 oz Oral BID Ojie, Jude,  MD      . Rivaroxaban (XARELTO) tablet 15 mg  15 mg Oral Q supper Algernon Huxley, MD   15 mg at 12/28/18 1743  . vancomycin (VANCOCIN) IVPB 1000 mg/200 mL premix  1,000 mg Intravenous Q24H Charlett Nose, RPH 200 mL/hr at 12/29/18 1121 1,000 mg at 12/29/18 1121  . vitamin C (ASCORBIC ACID) tablet 250 mg  250 mg Oral BID Otila Back, MD  Discharge Medications: Please see discharge summary for a list of discharge medications.  Relevant Imaging Results:  Relevant Lab Results:   Additional Information [id number]  Su Hilt, RN

## 2018-12-30 LAB — CBC
HCT: 23.5 % — ABNORMAL LOW (ref 39.0–52.0)
Hemoglobin: 7.5 g/dL — ABNORMAL LOW (ref 13.0–17.0)
MCH: 29.2 pg (ref 26.0–34.0)
MCHC: 31.9 g/dL (ref 30.0–36.0)
MCV: 91.4 fL (ref 80.0–100.0)
Platelets: 364 10*3/uL (ref 150–400)
RBC: 2.57 MIL/uL — ABNORMAL LOW (ref 4.22–5.81)
RDW: 14.6 % (ref 11.5–15.5)
WBC: 19.3 10*3/uL — ABNORMAL HIGH (ref 4.0–10.5)
nRBC: 0 % (ref 0.0–0.2)

## 2018-12-30 LAB — GLUCOSE, CAPILLARY
Glucose-Capillary: 189 mg/dL — ABNORMAL HIGH (ref 70–99)
Glucose-Capillary: 162 mg/dL — ABNORMAL HIGH (ref 70–99)
Glucose-Capillary: 219 mg/dL — ABNORMAL HIGH (ref 70–99)
Glucose-Capillary: 215 mg/dL — ABNORMAL HIGH (ref 70–99)
Glucose-Capillary: 183 mg/dL — ABNORMAL HIGH (ref 70–99)

## 2018-12-30 LAB — BASIC METABOLIC PANEL
Anion gap: 6 (ref 5–15)
BUN: 32 mg/dL — ABNORMAL HIGH (ref 8–23)
CO2: 27 mmol/L (ref 22–32)
Calcium: 7.9 mg/dL — ABNORMAL LOW (ref 8.9–10.3)
Chloride: 102 mmol/L (ref 98–111)
Creatinine, Ser: 1.18 mg/dL (ref 0.61–1.24)
GFR calc Af Amer: >60 mL/min (ref >60)
GFR calc non Af Amer: 58 mL/min — ABNORMAL LOW (ref >60)
Glucose, Bld: 181 mg/dL — ABNORMAL HIGH (ref 70–99)
Potassium: 4.3 mmol/L (ref 3.5–5.1)
Sodium: 135 mmol/L (ref 135–145)

## 2018-12-30 LAB — MAGNESIUM: Magnesium: 2.1 mg/dL (ref 1.7–2.4)

## 2018-12-30 MED ORDER — ZINC OXIDE 40 % EX OINT
TOPICAL_OINTMENT | CUTANEOUS | Status: DC | PRN
  Filled 2018-12-30: qty 113

## 2018-12-30 NOTE — Progress Notes (Signed)
[facility] [place] NAME: [name]    MR#:  [id number]  DATE OF BIRTH:  05-03-1935  SUBJECTIVE:  CHIEF COMPLAINT:   Chief Complaint  Patient presents with  . Shortness of Breath   Patient has no complaint except right stump pain. REVIEW OF SYSTEMS:  Review of Systems  Constitutional: Negative for chills and fever.  HENT: Negative for hearing loss and tinnitus.   Eyes: Negative for blurred vision and double vision.  Respiratory: Negative for cough and shortness of breath.   Cardiovascular: Negative for chest pain and palpitations.  Gastrointestinal: Negative for abdominal pain, heartburn, nausea and vomiting.  Genitourinary: Negative for dysuria and urgency.  Musculoskeletal: Negative for neck pain.  Skin: Negative for itching and rash.  Neurological: Negative for dizziness and headaches.  Psychiatric/Behavioral: Negative for depression and hallucinations.     DRUG ALLERGIES:   Allergies  Allergen Reactions  . Contrast Media [Iodinated Diagnostic Agents] Shortness Of Breath    SOB  . Iohexol Shortness Of Breath     Desc: Respiratory Distress, Laryngedema, diaphoresis, Onset Date: [id number]   . Metrizamide Shortness Of Breath    SOB SOB SOB   . Latex Rash   VITALS:  Blood pressure (!) 142/58, pulse 72, temperature 97.7 F (36.5 C), temperature source Oral, resp. rate 16, height 5\' 7"  (1.702 m), weight 79.9 kg, SpO2 93 %. PHYSICAL EXAMINATION:  Physical Exam  GENERAL:84 y.o.-year-old patient lying in the bed, feels miserable secondary to right foot pain EYES: Pupils equal, round, reactive to light and accommodation. No scleral icterus. Extraocular muscles intact.  HEENT: Head atraumatic, normocephalic. Oropharynx and nasopharynx clear.  NECK: Supple, no jugular venous distention. No thyroid enlargement, no tenderness.  LUNGS: Normal breath sounds bilaterally,bibasilar crackles,no wheezing, rhonchi or  crepitation. No use of accessory muscles of respiration.  CARDIOVASCULAR: S1, S2 normal. No rubs, or gallops. 3/6 systolic murmur present ABDOMEN: Soft, nontender, nondistended. Bowel sounds present. No organomegaly or mass.  EXTREMITIES:s/p left BKA previously and now status post recent right BKA on 12/27/2018. NEUROLOGIC: Awake and alert and oriented.  No focal deficit.   PSYCHIATRIC: Patient awake and alert and oriented.  Normal mood.   SKIN: No obvious rash, lesion.  Dressing in place to right foot.  LABORATORY PANEL:  Male CBC Recent Labs  Lab 12/30/18 0354  WBC 19.3*  HGB 7.5*  HCT 23.5*  PLT 364   ------------------------------------------------------------------------------------------------------------------ Chemistries  Recent Labs  Lab 12/30/18 0354  NA 135  K 4.3  CL 102  CO2 27  GLUCOSE 181*  BUN 32*  CREATININE 1.18  CALCIUM 7.9*  MG 2.1   RADIOLOGY:  No results found. ASSESSMENT AND PLAN:   [name] a84 y.o.malewith a known history of atrial fibrillation on Xarelto, CAD, ischemic cardiomyopathy with EF of 35 to 40%, hypertension, peripheral vascular disease status post left BKA and right leg angioplasty 2 days prior to this admission who presented to hospital secondary to confusion and worsening shortness of breath   1. Acute hypoxic respiratory failure-secondary to acute pulmonary edema. -Acute on chronic systolic heart failure. Last cardiac cath was in January 2020 showing chronic occlusions and collaterals from LAD. Medical management recommended. EF was around 35%. -Patient was admitted on high flow nasal cannula, much improved with IV diuresis. -Renal function slowly improving  -Currently on 2 L oxygen which is acute.  Wean off oxygen as tolerated.   Off Lasix due to recent acute kidney injury.  2. Sepsis-present on  admission.  Secondary to Diabetic right heel ulcer-appears necrotic, ischemic ulcer. Status post recent  revascularization and arterial stents placement on 12/18/2018. -Appreciate ID, podiatry and vascular consult. -Appreciate podiatry, ID and vascular consult. .  Blood cultures growing Proteus mirabilis.  Wound cultures so far growing Pseudomonas and MRSA.  IV Zosyn previously changed to meropenem.  Patient also on vancomycin. -MRI of the ankle -showing large wound in the posterior aspect of the heel, septic Achilles tendinopathy and possible abscess and pre-Achilles region with osteomyelitis of the calcaneus with cellulitis and mild fasciitis. Patient status post debridement in the OR on 12/23/2018 and wound VAC was applied.  However due to progression of gangrene patient subsequently had right BKA done on 12/27/2018  ID following patient.  To continue broad-spectrum IV antibiotics for 1 more day due to evidence of leukocytosis today.  ID specialist planning on possibly de-escalating antibiotics.  Still leukocytosis, continue current antibiotics and follow-up ID recommendation.  3. Uncontrolled diabetes mellitus- A1c is 8.9,  Continue current regimen of Lantus insulin. Continue sliding scale insulin coverage and monitor.  4. A. fib-rate controlled. Continue cardiac medications. Xarelto resumed.  5. CKD stage III-stable. Continue to monitor  6. Acute kidney injury. Renal function fairly stable  7.  Acute metabolic encephalopathy from underlying infection and pain medications. Significantly improved.  DVT prophylaxis-patient back on Xarelto Physical therapy suggest skilled nursing facility placement. Wheelchair-bound with walker transfers at baseline  All the records are reviewed and case discussed with Care Management/Social Worker. Management plans discussed with the patient, family and they are in agreement. I called patient's wife but nobody answered the phone.  CODE STATUS: Full Code  TOTAL TIME TAKING CARE OF THIS PATIENT: 35 minutes.   More than 50% of the time was spent  in counseling/coordination of care: YES  POSSIBLE D/C IN 3 DAYS, DEPENDING ON CLINICAL CONDITION.   [name] M.D on 12/30/2018 at 2:35 PM  Between 7am to 6pm - Pager - [phone]  After 6pm go to [email or web address] - Proofreader  [facility]  Office  [phone]  CC: Primary care physician; Corey Skains, MD  Note: This dictation was prepared with Dragon dictation along with smaller phrase technology. Any transcriptional errors that result from this process are unintentional.

## 2018-12-30 NOTE — Progress Notes (Signed)
Central tele called. Pt running 3 beats of non sustained vtach. Dr. Jannifer Franklin notified. No new orders received at this time.

## 2018-12-30 NOTE — Progress Notes (Signed)
Pt bottom is excoriated. Cleansed and dried. RN paged MD. New order for Desitin cream.

## 2018-12-30 NOTE — Progress Notes (Signed)
Physical Therapy Treatment Patient Details Name: Lamarcus Spira MRN: [id number] DOB: [birth year] Today's Date: 12/30/2018    History of Present Illness Pt admitted for ARF with complaints of SOB and non healing R heel wound. Pt has had complicated hospital stay and ended up requiring R BKA secondary to gangrene on 12/27/18. PMH includes Afib, CAD, cardiomyopathy, HTN, L BKA.    PT Comments    Pt asleep upon arrival.  Opens eyes with light voice and touch.  Attempted supine ex but pt limited due to pain RLE and remains not engaged for exercises on LLE.  During activity it is noted that pt is inc of urine needing full bed change.  Rolling left and right with hand over hand assist to reach for rail.  He is able to assist with rolling and holding side lying for care but continues to need mod assist.  OOB deferred due to pain and pt remaining generally sleepy with tasks.  Keeps eyes closed through most of session.    Follow Up Recommendations  SNF     Equipment Recommendations       Recommendations for Other Services       Precautions / Restrictions Precautions Precautions: Fall Restrictions Weight Bearing Restrictions: Yes RLE Weight Bearing: Non weight bearing LLE Weight Bearing: Touchdown weight bearing    Mobility  Bed Mobility Overal bed mobility: Needs Assistance Bed Mobility: Rolling Rolling: Mod assist         General bed mobility comments: pt is able to assist some when hands are placed on rail.  Transfers                 General transfer comment: unable  Ambulation/Gait             General Gait Details: unable   Stairs             Wheelchair Mobility    Modified Rankin (Stroke Patients Only)       Balance                                            Cognition Arousal/Alertness: Awake/alert Behavior During Therapy: WFL for tasks assessed/performed Overall Cognitive Status: No family/caregiver present to  determine baseline cognitive functioning                                        Exercises Other Exercises Other Exercises: minimal ROM ex BLE's - resists RLE and generally not engaged for LLE Other Exercises: rolling for complete bed change.    General Comments        Pertinent Vitals/Pain Pain Assessment: Faces Faces Pain Scale: Hurts whole lot Pain Location: R residual limb with any movement Pain Descriptors / Indicators: Operative site guarding;Moaning;Grimacing;Guarding Pain Intervention(s): Limited activity within patient's tolerance;Monitored during session    Home Living                      Prior Function            PT Goals (current goals can now be found in the care plan section) Progress towards PT goals: Progressing toward goals    Frequency    7X/week      PT Plan Current plan remains appropriate    Co-evaluation  AM-PAC PT "6 Clicks" Mobility   Outcome Measure  Help needed turning from your back to your side while in a flat bed without using bedrails?: A Lot Help needed moving from lying on your back to sitting on the side of a flat bed without using bedrails?: Total Help needed moving to and from a bed to a chair (including a wheelchair)?: Total Help needed standing up from a chair using your arms (e.g., wheelchair or bedside chair)?: Total Help needed to walk in hospital room?: Total Help needed climbing 3-5 steps with a railing? : Total 6 Click Score: 7    End of Session   Activity Tolerance: Patient limited by pain;Patient limited by lethargy Patient left: in bed;with call bell/phone within reach;with bed alarm set Nurse Communication: Other (comment) Pain - Right/Left: Right Pain - part of body: Leg     Time: 1388-7195 PT Time Calculation (min) (ACUTE ONLY): 14 min  Charges:  $Therapeutic Exercise: 8-22 mins                    Chesley Noon, PTA 12/30/18, 9:37 AM
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
## 2018-12-30 NOTE — Progress Notes (Signed)
3 Days Post-Op   Subjective/Chief Complaint: Seems comfortable and denies any pain     Objective: Vital signs in last 24 hours: Temp:  [97.7 F (36.5 C)-98.6 F (37 C)] 97.7 F (36.5 C) (08/08 0748) Pulse Rate:  [70-87] 72 (08/08 0748) Resp:  [16-20] 16 (08/08 0748) BP: (142-160)/(51-66) 142/58 (08/08 0748) SpO2:  [90 %-95 %] 93 % (08/08 0748) Last BM Date: 12/29/18  Intake/Output from previous day: 08/07 0701 - 08/08 0700 In: 654.4 [P.O.:240; IV Piggyback:414.4] Out: 375 [Urine:375] Intake/Output this shift: Total I/O In: 185.7 [IV Piggyback:185.7] Out: -   General appearance: alert and cooperative Head: Normocephalic, without obvious abnormality, atraumatic Eyes: conjunctivae/corneas clear. PERRL, EOM's intact. Fundi benign. Neck: no adenopathy, no carotid bruit, no JVD, supple, symmetrical, trachea midline and thyroid not enlarged, symmetric, no tenderness/mass/nodules Back: symmetric, no curvature. ROM normal. No CVA tenderness. Resp: clear to auscultation bilaterally Extremities: extremities normal, atraumatic, no cyanosis or edema Incision/Wound: Right stump dressing with some sero-sanguineous drainage.  Dressing will be changed by Nurse.  No foul smell.   Lab Results:  Recent Labs    12/29/18 0404 12/30/18 0354  WBC 19.0* 19.3*  HGB 8.2* 7.5*  HCT 25.5* 23.5*  PLT 354 364   BMET Recent Labs    12/29/18 0404 12/30/18 0354  NA 136 135  K 4.5 4.3  CL 104 102  CO2 27 27  GLUCOSE 159* 181*  BUN 20 32*  CREATININE 1.20 1.18  CALCIUM 7.9* 7.9*   PT/INR No results for input(s): LABPROT, INR in the last 72 hours. ABG No results for input(s): PHART, HCO3 in the last 72 hours.  Invalid input(s): PCO2, PO2  Studies/Results: No results found.  Anti-infectives: Anti-infectives (From admission, onward)   Start     Dose/Rate Route Frequency Ordered Stop   12/28/18 1130  vancomycin (VANCOCIN) IVPB 1000 mg/200 mL premix     1,000 mg 200 mL/hr over 60  Minutes Intravenous Every 24 hours 12/28/18 1115     12/26/18 1600  meropenem (MERREM) 1 g in sodium chloride 0.9 % 100 mL IVPB     1 g 200 mL/hr over 30 Minutes Intravenous Every 12 hours 12/26/18 1501     12/25/18 1400  vancomycin (VANCOCIN) IVPB 1000 mg/200 mL premix  Status:  Discontinued     1,000 mg 200 mL/hr over 60 Minutes Intravenous Every 24 hours 12/25/18 1323 12/28/18 1115   12/24/18 1200  vancomycin (VANCOCIN) 2,000 mg in sodium chloride 0.9 % 500 mL IVPB     2,000 mg 250 mL/hr over 120 Minutes Intravenous  Once 12/24/18 1123 12/24/18 1431   12/24/18 1124  vancomycin variable dose per unstable renal function (pharmacist dosing)  Status:  Discontinued      Does not apply See admin instructions 12/24/18 1124 12/25/18 1323   12/20/18 1715  piperacillin-tazobactam (ZOSYN) IVPB 3.375 g  Status:  Discontinued     3.375 g 100 mL/hr over 30 Minutes Intravenous Every 8 hours 12/20/18 1704 12/20/18 1713   12/20/18 1715  piperacillin-tazobactam (ZOSYN) IVPB 3.375 g  Status:  Discontinued     3.375 g 12.5 mL/hr over 240 Minutes Intravenous Every 8 hours 12/20/18 1714 12/26/18 1449      Assessment/Plan: s/p Procedure(s): AMPUTATION BELOW KNEE (Right) Advance diet Continue ABX therapy due to Post-op infection  LOS: 10 days    [name] 12/30/2018

## 2018-12-31 LAB — CBC
HCT: 19.8 % — ABNORMAL LOW (ref 39.0–52.0)
Hemoglobin: 6.4 g/dL — ABNORMAL LOW (ref 13.0–17.0)
MCH: 29.2 pg (ref 26.0–34.0)
MCHC: 32.3 g/dL (ref 30.0–36.0)
MCV: 90.4 fL (ref 80.0–100.0)
Platelets: 376 10*3/uL (ref 150–400)
RBC: 2.19 MIL/uL — ABNORMAL LOW (ref 4.22–5.81)
RDW: 14.7 % (ref 11.5–15.5)
WBC: 14.3 10*3/uL — ABNORMAL HIGH (ref 4.0–10.5)
nRBC: 0 % (ref 0.0–0.2)

## 2018-12-31 LAB — BASIC METABOLIC PANEL
Anion gap: 8 (ref 5–15)
BUN: 32 mg/dL — ABNORMAL HIGH (ref 8–23)
CO2: 24 mmol/L (ref 22–32)
Calcium: 7.9 mg/dL — ABNORMAL LOW (ref 8.9–10.3)
Chloride: 105 mmol/L (ref 98–111)
Creatinine, Ser: 1.1 mg/dL (ref 0.61–1.24)
GFR calc Af Amer: >60 mL/min (ref >60)
GFR calc non Af Amer: >60 mL/min (ref >60)
Glucose, Bld: 125 mg/dL — ABNORMAL HIGH (ref 70–99)
Potassium: 4 mmol/L (ref 3.5–5.1)
Sodium: 137 mmol/L (ref 135–145)

## 2018-12-31 LAB — GLUCOSE, CAPILLARY
Glucose-Capillary: 105 mg/dL — ABNORMAL HIGH (ref 70–99)
Glucose-Capillary: 131 mg/dL — ABNORMAL HIGH (ref 70–99)
Glucose-Capillary: 184 mg/dL — ABNORMAL HIGH (ref 70–99)
Glucose-Capillary: 184 mg/dL — ABNORMAL HIGH (ref 70–99)
Glucose-Capillary: 201 mg/dL — ABNORMAL HIGH (ref 70–99)
Glucose-Capillary: 202 mg/dL — ABNORMAL HIGH (ref 70–99)

## 2018-12-31 LAB — PREPARE RBC (CROSSMATCH)

## 2018-12-31 LAB — OCCULT BLOOD X 1 CARD TO LAB, STOOL: Fecal Occult Bld: POSITIVE — AB

## 2018-12-31 MED ORDER — FLEET ENEMA 7-19 GM/118ML RE ENEM
1.0000 | ENEMA | Freq: Every day | RECTAL | Status: DC | PRN
  Administered 2018-12-31: 1 via RECTAL
  Filled 2018-12-31: qty 1

## 2018-12-31 MED ORDER — PIPERACILLIN-TAZOBACTAM 3.375 G IVPB
3.3750 g | Freq: Three times a day (TID) | INTRAVENOUS | Status: AC
  Administered 2018-12-31 – 2019-01-01 (×4): 3.375 g via INTRAVENOUS
  Filled 2018-12-31 (×7): qty 50

## 2018-12-31 MED ORDER — SENNA 8.6 MG PO TABS
1.0000 | ORAL_TABLET | Freq: Every day | ORAL | Status: DC | PRN
  Filled 2018-12-31: qty 1

## 2018-12-31 MED ORDER — SODIUM CHLORIDE 0.9% IV SOLUTION
Freq: Once | INTRAVENOUS | Status: AC
  Administered 2018-12-31: 11:00:00 via INTRAVENOUS

## 2018-12-31 MED ORDER — ACETAMINOPHEN 325 MG PO TABS
650.0000 mg | ORAL_TABLET | Freq: Once | ORAL | Status: AC
  Administered 2018-12-31: 650 mg via ORAL
  Filled 2018-12-31: qty 2

## 2018-12-31 MED ORDER — BISACODYL 5 MG PO TBEC
5.0000 mg | DELAYED_RELEASE_TABLET | Freq: Every day | ORAL | Status: DC | PRN
  Administered 2019-01-01: 5 mg via ORAL
  Filled 2018-12-31 (×2): qty 1

## 2018-12-31 NOTE — Progress Notes (Signed)
[facility] [place] NAME: [name]    MR#:  [id number]  DATE OF BIRTH:  September 18, 1934  SUBJECTIVE:  CHIEF COMPLAINT:   Chief Complaint  Patient presents with  . Shortness of Breath   Patient has no complaint except right stump pain.  Some bleeding from stump wound per RN. REVIEW OF SYSTEMS:  Review of Systems  Constitutional: Negative for chills and fever.  HENT: Negative for hearing loss and tinnitus.   Eyes: Negative for blurred vision and double vision.  Respiratory: Negative for cough and shortness of breath.   Cardiovascular: Negative for chest pain and palpitations.  Gastrointestinal: Negative for abdominal pain, heartburn, nausea and vomiting.  Genitourinary: Negative for dysuria and urgency.  Musculoskeletal: Negative for neck pain.       Right stump pain.  Skin: Negative for itching and rash.  Neurological: Negative for dizziness and headaches.  Psychiatric/Behavioral: Negative for depression and hallucinations.     DRUG ALLERGIES:   Allergies  Allergen Reactions  . Contrast Media [Iodinated Diagnostic Agents] Shortness Of Breath    SOB  . Iohexol Shortness Of Breath     Desc: Respiratory Distress, Laryngedema, diaphoresis, Onset Date: [id number]   . Metrizamide Shortness Of Breath    SOB SOB SOB   . Latex Rash   VITALS:  Blood pressure (!) 158/62, pulse 74, temperature 98 F (36.7 C), temperature source Oral, resp. rate 18, height 5\' 7"  (1.702 m), weight 79.9 kg, SpO2 96 %. PHYSICAL EXAMINATION:  Physical Exam  GENERAL:84 y.o.-year-old patient lying in the bed, feels miserable secondary to right foot pain EYES: Pupils equal, round, reactive to light and accommodation. No scleral icterus. Extraocular muscles intact.  HEENT: Head atraumatic, normocephalic. Oropharynx and nasopharynx clear.  NECK: Supple, no jugular venous distention. No thyroid enlargement, no tenderness.  LUNGS: Normal breath sounds  bilaterally,bibasilar crackles,no wheezing, rhonchi or crepitation. No use of accessory muscles of respiration.  CARDIOVASCULAR: S1, S2 normal. No rubs, or gallops. 3/6 systolic murmur present ABDOMEN: Soft, nontender, nondistended. Bowel sounds present. No organomegaly or mass.  EXTREMITIES:s/p left BKA previously and now status post recent right BKA on 12/27/2018.  NEUROLOGIC: Awake and alert and oriented.  No focal deficit.   PSYCHIATRIC: Patient awake and alert and oriented. SKIN: No obvious rash, lesion.  LABORATORY PANEL:  Male CBC Recent Labs  Lab 12/31/18 0640  WBC 14.3*  HGB 6.4*  HCT 19.8*  PLT 376   ------------------------------------------------------------------------------------------------------------------ Chemistries  Recent Labs  Lab 12/30/18 0354 12/31/18 0640  NA 135 137  K 4.3 4.0  CL 102 105  CO2 27 24  GLUCOSE 181* 125*  BUN 32* 32*  CREATININE 1.18 1.10  CALCIUM 7.9* 7.9*  MG 2.1  --    RADIOLOGY:  No results found. ASSESSMENT AND PLAN:   [name] a84 y.o.malewith a known history of atrial fibrillation on Xarelto, CAD, ischemic cardiomyopathy with EF of 35 to 40%, hypertension, peripheral vascular disease status post left BKA and right leg angioplasty 2 days prior to this admission who presented to hospital secondary to confusion and worsening shortness of breath   1. Acute hypoxic respiratory failure-secondary to acute pulmonary edema. -Acute on chronic systolic heart failure. Last cardiac cath was in January 2020 showing chronic occlusions and collaterals from LAD. Medical management recommended. EF was around 35%. -Patient was admitted on high flow nasal cannula, much improved with IV diuresis. -Renal function slowly improving  -Currently on 2 L oxygen which is acute.  Wean off oxygen as tolerated.   Off Lasix due to recent acute kidney injury.  2. Sepsis-present on admission.  Secondary to Diabetic right heel  ulcer-appears necrotic, ischemic ulcer. Status post recent revascularization and arterial stents placement on 12/18/2018. -Appreciate ID, podiatry and vascular consult. -Appreciate podiatry, ID and vascular consult. .  Blood cultures growing Proteus mirabilis.  Wound cultures so far growing Pseudomonas and MRSA.  IV Zosyn previously changed to meropenem.  Patient also on vancomycin. -MRI of the ankle -showing large wound in the posterior aspect of the heel, septic Achilles tendinopathy and possible abscess and pre-Achilles region with osteomyelitis of the calcaneus with cellulitis and mild fasciitis. Patient status post debridement in the OR on 12/23/2018 and wound VAC was applied.  However due to progression of gangrene patient subsequently had right BKA done on 12/27/2018  ID following patient.  To continue broad-spectrum IV antibiotics for 1 more day due to evidence of leukocytosis.  ID specialist planning on possibly de-escalating antibiotics.  Still leukocytosis, continue current antibiotics and follow-up ID recommendation. Continue local wound care.  3. Uncontrolled diabetes mellitus- A1c is 8.9,  Continue current regimen of Lantus insulin. Continue sliding scale insulin coverage and monitor.  4. A. fib-rate controlled. Continue cardiac medications. Xarelto resumed.  5. ARF onCKD stage III-stable.   6. Acute metabolic encephalopathy from underlying infection and pain medications. Significantly improved.  Anemia of chronic disease and due to acute blood loss.  Hemoglobin decreased to 6.4.  PRBC transfusion.  Follow-up CBC.  DVT prophylaxis-patient back on Xarelto Physical therapy suggest skilled nursing facility placement. Wheelchair-bound with walker transfers at baseline  All the records are reviewed and case discussed with Care Management/Social Worker. Management plans discussed with the patient, family and they are in agreement. I called patient's wife but still nobody  answered the phone.  CODE STATUS: Full Code  TOTAL TIME TAKING CARE OF THIS PATIENT: 35 minutes.   More than 50% of the time was spent in counseling/coordination of care: YES  POSSIBLE D/C IN 2-3 DAYS, DEPENDING ON CLINICAL CONDITION.   [name] M.D on 12/31/2018 at 1:11 PM  Between 7am to 6pm - Pager - [phone]  After 6pm go to [email or web address] - Proofreader  [facility]  Office  [phone]  CC: Primary care physician; Corey Skains, MD  Note: This dictation was prepared with Dragon dictation along with smaller phrase technology. Any transcriptional errors that result from this process are unintentional.

## 2018-12-31 NOTE — Progress Notes (Signed)
PT Cancellation Note  Patient Details Name: [name] MRN: [id number] DOB: [birth year]   Cancelled Treatment:    Reason Eval/Treat Not Completed: Medical issues which prohibited therapy  Chart reviewed.  HgB 6.4 HCT19.8.  Per therapy protocols will hold session this AM and continue as appropriate.  Chesley Noon 12/31/2018, 8:22 AM

## 2018-12-31 NOTE — Plan of Care (Signed)
Patient has redness and purulence on the bottom backside of his scrotum. Dr Lance Coon made aware. Desitin applied, area swollen and tender. Patient turned to side, per patient request. He then turned back onto his back before I left his room. Patient is restless and pulls at condom cath.

## 2018-12-31 NOTE — Progress Notes (Signed)
Fecal Occult test positive. Prime doc made aware. Stool was brown and not frank. CBC in AM.

## 2018-12-31 NOTE — Plan of Care (Signed)
  Problem: Education: Goal: Knowledge of General Education information will improve Description: Including pain rating scale, medication(s)/side effects and non-pharmacologic comfort measures 12/31/2018 0422 by Cheron Every, RN Outcome: Progressing 12/31/2018 0419 by Cheron Every, RN Outcome: Progressing   Problem: Health Behavior/Discharge Planning: Goal: Ability to manage health-related needs will improve 12/31/2018 0422 by Cheron Every, RN Outcome: Progressing 12/31/2018 0419 by Cheron Every, RN Outcome: Progressing   Problem: Clinical Measurements: Goal: Ability to maintain clinical measurements within normal limits will improve 12/31/2018 0422 by Cheron Every, RN Outcome: Progressing 12/31/2018 0419 by Cheron Every, RN Outcome: Progressing Goal: Will remain free from infection 12/31/2018 0422 by Cheron Every, RN Outcome: Progressing 12/31/2018 0419 by Cheron Every, RN Outcome: Progressing Goal: Diagnostic test results will improve 12/31/2018 0422 by Cheron Every, RN Outcome: Progressing 12/31/2018 0419 by Cheron Every, RN Outcome: Progressing Goal: Respiratory complications will improve 12/31/2018 0422 by Cheron Every, RN Outcome: Progressing 12/31/2018 0419 by Cheron Every, RN Outcome: Progressing Goal: Cardiovascular complication will be avoided 12/31/2018 0422 by Cheron Every, RN Outcome: Progressing 12/31/2018 0419 by Cheron Every, RN Outcome: Progressing   Problem: Activity: Goal: Risk for activity intolerance will decrease 12/31/2018 0422 by Cheron Every, RN Outcome: Progressing 12/31/2018 0419 by Cheron Every, RN Outcome: Progressing   Problem: Nutrition: Goal: Adequate nutrition will be maintained 12/31/2018 0422 by Cheron Every, RN Outcome: Progressing 12/31/2018 0419 by Cheron Every, RN Outcome: Progressing   Problem: Coping: Goal: Level of anxiety will  decrease 12/31/2018 0422 by Cheron Every, RN Outcome: Progressing 12/31/2018 0419 by Cheron Every, RN Outcome: Progressing   Problem: Elimination: Goal: Will not experience complications related to bowel motility 12/31/2018 0422 by Cheron Every, RN Outcome: Progressing 12/31/2018 0419 by Cheron Every, RN Outcome: Progressing Goal: Will not experience complications related to urinary retention 12/31/2018 0422 by Cheron Every, RN Outcome: Progressing 12/31/2018 0419 by Cheron Every, RN Outcome: Progressing   Problem: Pain Managment: Goal: General experience of comfort will improve 12/31/2018 0422 by Cheron Every, RN Outcome: Progressing 12/31/2018 0419 by Cheron Every, RN Outcome: Progressing   Problem: Safety: Goal: Ability to remain free from injury will improve 12/31/2018 0422 by Cheron Every, RN Outcome: Progressing 12/31/2018 0419 by Cheron Every, RN Outcome: Progressing   Problem: Skin Integrity: Goal: Risk for impaired skin integrity will decrease 12/31/2018 0422 by Cheron Every, RN Outcome: Progressing 12/31/2018 0419 by Cheron Every, RN Outcome: Progressing

## 2018-12-31 NOTE — Progress Notes (Signed)
4 Days Post-Op   Subjective/Chief Complaint: Patient seems comfortable.  WBC trending down.    Objective: Vital signs in last 24 hours: Temp:  [98 F (36.7 C)-100.4 F (38 C)] 98 F (36.7 C) (08/09 1119) Pulse Rate:  [63-74] 74 (08/09 1119) Resp:  [16-18] 18 (08/09 1119) BP: (125-163)/(47-63) 158/62 (08/09 1119) SpO2:  [91 %-97 %] 96 % (08/09 1119) Last BM Date: 12/26/18  Intake/Output from previous day: 08/08 0701 - 08/09 0700 In: 485.7 [IV Piggyback:485.7] Out: 1000 [Urine:1000] Intake/Output this shift: Total I/O In: 100.8 [IV Piggyback:100.8] Out: -   General appearance: alert, cooperative and appears stated age Head: Normocephalic, without obvious abnormality, atraumatic Eyes: conjunctivae/corneas clear. PERRL, EOM's intact. Fundi benign. Resp: clear to auscultation bilaterally Extremities: Right stump dressing intact.    Lab Results:  Recent Labs    12/30/18 0354 12/31/18 0640  WBC 19.3* 14.3*  HGB 7.5* 6.4*  HCT 23.5* 19.8*  PLT 364 376   BMET Recent Labs    12/30/18 0354 12/31/18 0640  NA 135 137  K 4.3 4.0  CL 102 105  CO2 27 24  GLUCOSE 181* 125*  BUN 32* 32*  CREATININE 1.18 1.10  CALCIUM 7.9* 7.9*   PT/INR No results for input(s): LABPROT, INR in the last 72 hours. ABG No results for input(s): PHART, HCO3 in the last 72 hours.  Invalid input(s): PCO2, PO2  Studies/Results: No results found.  Anti-infectives: Anti-infectives (From admission, onward)   Start     Dose/Rate Route Frequency Ordered Stop   12/31/18 1600  piperacillin-tazobactam (ZOSYN) IVPB 3.375 g     3.375 g 12.5 mL/hr over 240 Minutes Intravenous Every 8 hours 12/31/18 0818     12/28/18 1130  vancomycin (VANCOCIN) IVPB 1000 mg/200 mL premix  Status:  Discontinued     1,000 mg 200 mL/hr over 60 Minutes Intravenous Every 24 hours 12/28/18 1115 12/31/18 0816   12/26/18 1600  meropenem (MERREM) 1 g in sodium chloride 0.9 % 100 mL IVPB  Status:  Discontinued     1  g 200 mL/hr over 30 Minutes Intravenous Every 12 hours 12/26/18 1501 12/31/18 0816   12/25/18 1400  vancomycin (VANCOCIN) IVPB 1000 mg/200 mL premix  Status:  Discontinued     1,000 mg 200 mL/hr over 60 Minutes Intravenous Every 24 hours 12/25/18 1323 12/28/18 1115   12/24/18 1200  vancomycin (VANCOCIN) 2,000 mg in sodium chloride 0.9 % 500 mL IVPB     2,000 mg 250 mL/hr over 120 Minutes Intravenous  Once 12/24/18 1123 12/24/18 1431   12/24/18 1124  vancomycin variable dose per unstable renal function (pharmacist dosing)  Status:  Discontinued      Does not apply See admin instructions 12/24/18 1124 12/25/18 1323   12/20/18 1715  piperacillin-tazobactam (ZOSYN) IVPB 3.375 g  Status:  Discontinued     3.375 g 100 mL/hr over 30 Minutes Intravenous Every 8 hours 12/20/18 1704 12/20/18 1713   12/20/18 1715  piperacillin-tazobactam (ZOSYN) IVPB 3.375 g  Status:  Discontinued     3.375 g 12.5 mL/hr over 240 Minutes Intravenous Every 8 hours 12/20/18 1714 12/26/18 1449      Assessment/Plan: s/p Procedure(s): AMPUTATION BELOW KNEE (Right) Continue with local wound care  LOS: 11 days    Elmore Guise 12/31/2018

## 2019-01-01 DIAGNOSIS — D500 Iron deficiency anemia secondary to blood loss (chronic): Secondary | ICD-10-CM

## 2019-01-01 DIAGNOSIS — R509 Fever, unspecified: Secondary | ICD-10-CM

## 2019-01-01 LAB — GLUCOSE, CAPILLARY
Glucose-Capillary: 114 mg/dL — ABNORMAL HIGH (ref 70–99)
Glucose-Capillary: 122 mg/dL — ABNORMAL HIGH (ref 70–99)
Glucose-Capillary: 137 mg/dL — ABNORMAL HIGH (ref 70–99)
Glucose-Capillary: 191 mg/dL — ABNORMAL HIGH (ref 70–99)
Glucose-Capillary: 208 mg/dL — ABNORMAL HIGH (ref 70–99)
Glucose-Capillary: 251 mg/dL — ABNORMAL HIGH (ref 70–99)
Glucose-Capillary: 288 mg/dL — ABNORMAL HIGH (ref 70–99)

## 2019-01-01 LAB — CBC
HCT: 27.2 % — ABNORMAL LOW (ref 39.0–52.0)
Hemoglobin: 8.9 g/dL — ABNORMAL LOW (ref 13.0–17.0)
MCH: 29.2 pg (ref 26.0–34.0)
MCHC: 32.7 g/dL (ref 30.0–36.0)
MCV: 89.2 fL (ref 80.0–100.0)
Platelets: 510 10*3/uL — ABNORMAL HIGH (ref 150–400)
RBC: 3.05 MIL/uL — ABNORMAL LOW (ref 4.22–5.81)
RDW: 14.6 % (ref 11.5–15.5)
WBC: 16.6 10*3/uL — ABNORMAL HIGH (ref 4.0–10.5)
nRBC: 0 % (ref 0.0–0.2)

## 2019-01-01 LAB — TYPE AND SCREEN
ABO/RH(D): O POS
Antibody Screen: NEGATIVE
Unit division: 0

## 2019-01-01 LAB — BPAM RBC
Blood Product Expiration Date: 202008192359
ISSUE DATE / TIME: 202008091053
Unit Type and Rh: 5100

## 2019-01-01 MED ORDER — POLYETHYLENE GLYCOL 3350 17 G PO PACK
1.0000 | PACK | Freq: Once | ORAL | Status: AC
  Administered 2019-01-01: 17 g via ORAL
  Filled 2019-01-01: qty 1

## 2019-01-01 NOTE — Plan of Care (Signed)

## 2019-01-01 NOTE — Progress Notes (Signed)
OT Cancellation Note  Patient Details Name: [name] MRN: [id number] DOB: 12/04/1934   Cancelled Treatment:    Reason Eval/Treat Not Completed: Pain limiting ability to participate. Upon attempt, pt in bed, resting with eyes closed. Wakes easily to OT's voice. When asked, pt reports "not doing good" and reporting 8/10 pain. Despite maximal encouragement and education in benefits of participation in therapy, pt continues to refuse. RN notified of pain. Will re-attempt next date.   Jeni Salles, MPH, MS, OTR/L ascom [phone] 01/01/19, 2:57 PM

## 2019-01-01 NOTE — Progress Notes (Signed)
[facility] [place] NAME: Robb Sibal    MR#:  [id number]  DATE OF BIRTH:  10/27/1934  SUBJECTIVE:  CHIEF COMPLAINT:   Chief Complaint  Patient presents with  . Shortness of Breath   Patient has right stump pain.  He had fever last night, leukocytosis is worsening. REVIEW OF SYSTEMS:  Review of Systems  Constitutional: Negative for chills and fever.  HENT: Negative for hearing loss and tinnitus.   Eyes: Negative for blurred vision and double vision.  Respiratory: Negative for cough and shortness of breath.   Cardiovascular: Negative for chest pain and palpitations.  Gastrointestinal: Negative for abdominal pain, heartburn, nausea and vomiting.  Genitourinary: Negative for dysuria and urgency.  Musculoskeletal: Negative for neck pain.       Right stump pain.  Skin: Negative for itching and rash.  Neurological: Negative for dizziness and headaches.  Psychiatric/Behavioral: Negative for depression and hallucinations.     DRUG ALLERGIES:   Allergies  Allergen Reactions  . Contrast Media [Iodinated Diagnostic Agents] Shortness Of Breath    SOB  . Iohexol Shortness Of Breath     Desc: Respiratory Distress, Laryngedema, diaphoresis, Onset Date: [id number]   . Metrizamide Shortness Of Breath    SOB SOB SOB   . Latex Rash   VITALS:  Blood pressure (!) 154/59, pulse 70, temperature 99.4 F (37.4 C), temperature source Oral, resp. rate (!) 22, height 5\' 7"  (1.702 m), weight 79.9 kg, SpO2 95 %. PHYSICAL EXAMINATION:  Physical Exam  GENERAL:84 y.o.-year-old patient lying in the bed, feels miserable secondary to right foot pain EYES: Pupils equal, round, reactive to light and accommodation. No scleral icterus. Extraocular muscles intact.  HEENT: Head atraumatic, normocephalic. Oropharynx and nasopharynx clear.  NECK: Supple, no jugular venous distention. No thyroid enlargement, no tenderness.  LUNGS: Normal breath sounds  bilaterally,bibasilar crackles,no wheezing, rhonchi or crepitation. No use of accessory muscles of respiration.  CARDIOVASCULAR: S1, S2 normal. No rubs, or gallops. 3/6 systolic murmur present ABDOMEN: Soft, nontender, nondistended. Bowel sounds present. No organomegaly or mass.  EXTREMITIES:s/p left BKA previously and now status post recent right BKA on 12/27/2018.  Stump in dressing. NEUROLOGIC: Awake and alert and oriented.  No focal deficit.   PSYCHIATRIC: Patient awake and alert and oriented. SKIN: No obvious rash, lesion.  LABORATORY PANEL:  Male CBC Recent Labs  Lab 01/01/19 0444  WBC 16.6*  HGB 8.9*  HCT 27.2*  PLT 510*   ------------------------------------------------------------------------------------------------------------------ Chemistries  Recent Labs  Lab 12/30/18 0354 12/31/18 0640  NA 135 137  K 4.3 4.0  CL 102 105  CO2 27 24  GLUCOSE 181* 125*  BUN 32* 32*  CREATININE 1.18 1.10  CALCIUM 7.9* 7.9*  MG 2.1  --    RADIOLOGY:  No results found. ASSESSMENT AND PLAN:   [name] a83 y.o.malewith a known history of atrial fibrillation on Xarelto, CAD, ischemic cardiomyopathy with EF of 35 to 40%, hypertension, peripheral vascular disease status post left BKA and right leg angioplasty 2 days prior to this admission who presented to hospital secondary to confusion and worsening shortness of breath   1. Acute hypoxic respiratory failure-secondary to acute pulmonary edema. -Acute on chronic systolic heart failure. Last cardiac cath was in January 2020 showing chronic occlusions and collaterals from LAD. Medical management recommended. EF was around 35%. -Patient was admitted on high flow nasal cannula, much improved with IV diuresis. -Renal function slowly improving  -Currently on 2 L oxygen which is  acute.  Wean off oxygen as tolerated.   Off Lasix due to recent acute kidney injury.  2. Sepsis-present on admission.  Secondary to Diabetic  right heel ulcer-appears necrotic, ischemic ulcer. Status post recent revascularization and arterial stents placement on 12/18/2018. -Appreciate ID, podiatry and vascular consult. -Appreciate podiatry, ID and vascular consult. .  Blood cultures growing Proteus mirabilis.  Wound cultures so far growing Pseudomonas and MRSA.  IV Zosyn previously changed to meropenem.  Patient also on vancomycin. -MRI of the ankle -showing large wound in the posterior aspect of the heel, septic Achilles tendinopathy and possible abscess and pre-Achilles region with osteomyelitis of the calcaneus with cellulitis and mild fasciitis. Patient status post debridement in the OR on 12/23/2018 and wound VAC was applied.  However due to progression of gangrene patient subsequently had right BKA done on 12/27/2018  ID following patient.  To continue broad-spectrum IV antibiotics for 1 more day due to evidence of leukocytosis.  ID specialist planning on possibly de-escalating antibiotics.  Still leukocytosis, changed to IV Zosyn per Dr. Erline Hau.. Continue local wound care.   3. Diabetes mellitus- A1c is 8.9,  Continue current regimen of Lantus insulin. Continue sliding scale insulin coverage and monitor.  4. A. fib-rate controlled. Continue cardiac medications. Xarelto resumed.  Hold Xarelto due to positive FOBT PE and anemia.  5. ARF onCKD stage III-stable.   6. Acute metabolic encephalopathy from underlying infection and pain medications. Significantly improved.  Anemia of chronic disease and due to acute blood loss.  Hemoglobin decreased to 6.4.  Increased to 8.4 after 1 unit PRBC transfusion.  Follow-up CBC.  Positive FOTB and anemia. Hold Xarelto. GIB consult.  DVT prophylaxis: SCD. Physical therapy suggest skilled nursing facility placement. Wheelchair-bound with walker transfers at baseline Discussed with Dr. Ramon Dredge. All the records are reviewed and case discussed with Care Management/Social  Worker. Management plans discussed with the patient, family and they are in agreement. I called patient's wife and I discussed with his wife about his current condition and treatment plan.  CODE STATUS: Full Code  TOTAL TIME TAKING CARE OF THIS PATIENT: 36 minutes.   More than 50% of the time was spent in counseling/coordination of care: YES  POSSIBLE D/C IN 3 DAYS, DEPENDING ON CLINICAL CONDITION.   [name] M.D on 01/01/2019 at 1:18 PM  Between 7am to 6pm - Pager - [phone]  After 6pm go to [email or web address] - Proofreader  [facility]  Office  [phone]  CC: Primary care physician; Corey Skains, MD  Note: This dictation was prepared with Dragon dictation along with smaller phrase technology. Any transcriptional errors that result from this process are unintentional.

## 2019-01-01 NOTE — TOC Progression Note (Signed)
Transition of Care [facility]) - Progression Note    Patient Details  Name: [name] MRN: [id number] Date of Birth: March 21, 1935  Transition of Care [facility]) CM/SW Contact  Su Hilt, RN Phone Number: 01/01/2019, 12:41 PM  Clinical Narrative:    Spoke with the son [name] and the wife Everlene Farrier They both agree on [facility] for the bed offer, I accepted the offer in the [facility] and called Claiborne Billings at [facility] and requested for her to start insurance auth        Expected Discharge Plan and Services                                                 Social Determinants of Health (SDOH) Interventions    Readmission Risk Interventions Readmission Risk Prevention Plan 12/25/2018 12/23/2018  Transportation Screening Complete Complete  PCP or Specialist Appt within 3-5 Days Complete -  HRI or Home Care Consult Complete Complete  Social Work Consult for [place] Planning/Counseling Complete -  Palliative Care Screening Not Applicable -  Medication Review [employer]) Complete Complete  Some recent data might be hidden

## 2019-01-01 NOTE — TOC Progression Note (Signed)
Transition of Care [facility]) - Progression Note    Patient Details  Name: [name] MRN: [id number] Date of Birth: [birth year]  Transition of Care [facility]) CM/SW Contact  Su Hilt, RN Phone Number: 01/01/2019, 12:28 PM  Clinical Narrative:    Spoke with Son [name] and reviewed option for rehab beds.  We discussed where each facility was located.  I explained that [facility] has declined and that is not an option. [name] stated that he prefers to review these with his mother and then they will call me back with a choice        Expected Discharge Plan and Services                                                 Social Determinants of Health (SDOH) Interventions    Readmission Risk Interventions Readmission Risk Prevention Plan 12/25/2018 12/23/2018  Transportation Screening Complete Complete  PCP or Specialist Appt within 3-5 Days Complete -  HRI or Wing Complete Complete  Social Work Consult for [place] Planning/Counseling Complete -  Palliative Care Screening Not Applicable -  Medication Review Press photographer) Complete Complete  Some recent data might be hidden

## 2019-01-01 NOTE — Consult Note (Signed)
Lucilla Lame, MD [facility]  [address] [place], [place] [address] Phone: [phone] Fax : [phone]  Consultation  Referring Provider:     Dr. Bridgett Larsson Primary Care Physician:  Corey Skains, MD Primary Gastroenterologist: Althia Forts         Reason for Consultation:     Anemia  Date of Admission:  12/20/2018 Date of Consultation:  01/01/2019         HPI:   [name] is a 84 y.o. male who was admitted with right foot gangrene and had a right below-knee amputation.  The patient has a history of a left leg below-knee amputation.  The patient has had a continued leukocytosis and has had his blood count trending down.  The patient had his stools checked for blood and he was heme positive.  The patient denies ever having an EGD or colonoscopy in the past. The patient has a history of atrial fibrillation with ischemic cardiomyopathy and ejection fraction of 35 to 40%. The patient denies any abdominal pain.  He also denies any black stools or bloody stools.  He does state that he has always been constipated and takes a laxative.   Past Medical History:  Diagnosis Date  . Arthritis   . Atrial fibrillation ([place])   . Carcinoma of prostate ([place])   . CHF (congestive heart failure) ([place])   . Chronic kidney disease   . Diabetes mellitus without complication ([place])   . ED (erectile dysfunction)   . Frequent urination   . Hyperlipidemia   . Hypertension   . Moderate mitral insufficiency   . Peripheral vascular disease ([place])   . Pneumonia 04/2016  . Prostate cancer ([place])   . Sleep apnea    OSA--USE C-PAP  . Ulcer of left foot due to type 2 diabetes mellitus ([place])   . Urinary stress incontinence, male     Past Surgical History:  Procedure Laterality Date  . AMPUTATION Left 01/12/2017   Procedure: AMPUTATION BELOW KNEE;  Surgeon: Algernon Huxley, MD;  Location: [facility] ORS;  Service: General;  Laterality: Left;  . AMPUTATION Right 12/27/2018   Procedure: AMPUTATION BELOW  KNEE;  Surgeon: Algernon Huxley, MD;  Location: [facility] ORS;  Service: General;  Laterality: Right;  . APLIGRAFT PLACEMENT Left 10/15/2016   Procedure: APLIGRAFT PLACEMENT;  Surgeon: Albertine Patricia, DPM;  Location: [facility] ORS;  Service: Podiatry;  Laterality: Left;  necrotic ulcer  . CHOLECYSTECTOMY    . EYE SURGERY Bilateral    Cataract Extraction with IOL  . HIP ARTHROPLASTY Right 10/13/2018   Procedure: ARTHROPLASTY BIPOLAR HIP (HEMIARTHROPLASTY);  Surgeon: Earnestine Leys, MD;  Location: [facility] ORS;  Service: Orthopedics;  Laterality: Right;  . IRRIGATION AND DEBRIDEMENT FOOT Left 10/15/2016   Procedure: IRRIGATION AND DEBRIDEMENT FOOT-EXCISIONAL DEBRIDEMENT OF SKIN 3RD, 4TH AND 5TH TOES WITH APPLICATION OF APLIGRAFT;  Surgeon: Albertine Patricia, DPM;  Location: [facility] ORS;  Service: Podiatry;  Laterality: Left;  necrotic,gangrene  . IRRIGATION AND DEBRIDEMENT FOOT Left 12/09/2016   Procedure: IRRIGATION AND DEBRIDEMENT FOOT-MUSCLE/FASCIA, RESECTION OF FOURTH AND FIFTH METATARSAL NECROTIC BONE;  Surgeon: Albertine Patricia, DPM;  Location: [facility] ORS;  Service: Podiatry;  Laterality: Left;  . IRRIGATION AND DEBRIDEMENT FOOT Right 12/23/2018   Procedure: IRRIGATION AND DEBRIDEMENT FOOT and wound vac application;  Surgeon: Samara Deist, DPM;  Location: [facility] OR[facility];  Service: Podiatry;  Laterality: Right;  . LEFT HEART CATH AND CORONARY ANGIOGRAPHY N/A 06/05/2018   Procedure: LEFT HEART CATH AND CORONARY ANGIOGRAPHY;  Surgeon: Clayborn Bigness,  Loran Senters, MD;  Location: [place] CV LAB;  Service: Cardiovascular;  Laterality: N/A;  . LOWER EXTREMITY ANGIOGRAPHY Left 08/16/2016   Procedure: Lower Extremity Angiography;  Surgeon: Algernon Huxley, MD;  Location: [place] CV LAB;  Service: Cardiovascular;  Laterality: Left;  . LOWER EXTREMITY ANGIOGRAPHY Left 09/01/2016   Procedure: Lower Extremity Angiography;  Surgeon: Algernon Huxley, MD;  Location: [place] CV LAB;  Service: Cardiovascular;  Laterality: Left;  . LOWER  EXTREMITY ANGIOGRAPHY Left 10/14/2016   Procedure: Lower Extremity Angiography;  Surgeon: Algernon Huxley, MD;  Location: [place] CV LAB;  Service: Cardiovascular;  Laterality: Left;  . LOWER EXTREMITY ANGIOGRAPHY Left 12/20/2016   Procedure: Lower Extremity Angiography;  Surgeon: Algernon Huxley, MD;  Location: [place] CV LAB;  Service: Cardiovascular;  Laterality: Left;  . LOWER EXTREMITY ANGIOGRAPHY Right 12/18/2018   Procedure: LOWER EXTREMITY ANGIOGRAPHY;  Surgeon: Algernon Huxley, MD;  Location: [place] CV LAB;  Service: Cardiovascular;  Laterality: Right;  . LOWER EXTREMITY INTERVENTION  09/01/2016   Procedure: Lower Extremity Intervention;  Surgeon: Algernon Huxley, MD;  Location: [place] CV LAB;  Service: Cardiovascular;;  . PROSTATE SURGERY     removal  . TONSILLECTOMY     as a child    Prior to Admission medications   Medication Sig Start Date End Date Taking? Authorizing Provider  amiodarone (PACERONE) 200 MG tablet Take 200 mg by mouth daily. 09/13/18  Yes [provider]  aspirin EC 81 MG tablet Take 1 tablet (81 mg total) by mouth daily. 12/18/18  Yes Algernon Huxley, MD  atorvastatin (LIPITOR) 80 MG tablet Take 80 mg by mouth every evening. 07/31/18  Yes [provider]  diltiazem (CARDIZEM CD) 120 MG 24 hr capsule Take 120 mg by mouth daily. 09/28/18  Yes [provider]  ferrous sulfate 325 (65 FE) MG tablet Take 1 tablet (325 mg total) by mouth daily with breakfast. 10/18/18  Yes [name] Mandes, MD  HYDROcodone-acetaminophen (NORCO/VICODIN) 5-325 MG tablet Take 1-2 tablets by mouth every 6 (six) hours as needed for moderate pain (pain score 4-6). 10/18/18  Yes [name] Mandes, MD  insulin glargine (LANTUS) 100 UNIT/ML injection Inject 0.15 mLs (15 Units total) into the skin at bedtime. 10/18/18  Yes [name] Mandes, MD  insulin lispro (HUMALOG) 100 UNIT/ML injection Inject 0.03 mLs (3 Units total) into the skin 3 (three) times daily with meals. Patient taking  differently: Inject 3 Units into the skin 3 (three) times daily with meals. Plus 1 additional unit for every 50 BG points over 200 (max 5 additional units per dose) 11/28/17  Yes Wieting, Richard, MD  metoprolol tartrate (LOPRESSOR) 25 MG tablet Take 1 tablet (25 mg total) by mouth 3 (three) times daily. 11/28/17  Yes Wieting, Richard, MD  Rivaroxaban (XARELTO) 15 MG TABS tablet Take 1 tablet (15 mg total) by mouth daily with supper. 10/18/18  Yes [name] Mandes, MD  traMADol (ULTRAM) 50 MG tablet Take 50 mg by mouth every 6 (six) hours as needed. 12/15/18  Yes [provider]  methocarbamol (ROBAXIN) 500 MG tablet Take 1 tablet (500 mg total) by mouth every 6 (six) hours as needed for muscle spasms. 10/18/18   [name] Mandes, MD  nitroGLYCERIN (NITROSTAT) 0.4 MG SL tablet Place 1 tablet (0.4 mg total) under the tongue every 5 (five) minutes as needed for chest pain. 06/09/18   Hillary Bow, MD    Family History  Problem Relation Age of Onset  .  Hypertension Mother   . Diabetes Mother   . Prostate cancer Neg Hx   . Bladder Cancer Neg Hx   . Kidney disease Neg Hx      Social History   Tobacco Use  . Smoking status: Former Smoker    Packs/day: 0.25    Types: Cigarettes    Quit date: 10/14/1994    Years since quitting: 24.2  . Smokeless tobacco: Never Used  Substance Use Topics  . Alcohol use: No    Alcohol/week: 0.0 standard drinks  . Drug use: No    Allergies as of 12/20/2018 - Review Complete 12/20/2018  Allergen Reaction Noted  . Contrast media [iodinated diagnostic agents] Shortness Of Breath 12/23/2014  . Iohexol Shortness Of Breath 09/17/2006  . Metrizamide Shortness Of Breath 12/23/2014  . Latex Rash 10/14/2014    Review of Systems:    All systems reviewed and negative except where noted in HPI.   Physical Exam:  Vital signs in last 24 hours: Temp:  [98 F (36.7 C)-100.9 F (38.3 C)] 99.4 F (37.4 C) (08/10 0837) Pulse Rate:  [69-71] 70 (08/10 0837) Resp:  [17-22]  22 (08/10 0837) BP: (151-154)/(59-80) 154/59 (08/10 0837) SpO2:  [92 %-97 %] 95 % (08/10 0837) Last BM Date: 12/26/18 General:   Pleasant, cooperative in NAD Head:  Normocephalic and atraumatic. Eyes:   No icterus.   Conjunctiva pink. PERRLA. Ears:  Normal auditory acuity. Neck:  Supple; no masses or thyroidomegaly Lungs: Respirations even and unlabored. Lungs clear to auscultation bilaterally.   No wheezes, crackles, or rhonchi.  Heart:  Regular rate and rhythm;  Without murmur, clicks, rubs or gallops Abdomen:  Soft, nondistended, nontender. Normal bowel sounds. No appreciable masses or hepatomegaly.  No rebound or guarding.  Rectal:  Not performed. Msk:  Symmetrical without gross deformities.    Extremities: Bilateral BKA's Neurologic:  Alert and oriented x3;  grossly normal neurologically. Skin:  Intact without significant lesions or rashes. Cervical Nodes:  No significant cervical adenopathy. Psych:  Alert and cooperative. Normal affect.  LAB RESULTS: Recent Labs    12/30/18 0354 12/31/18 0640 01/01/19 0444  WBC 19.3* 14.3* 16.6*  HGB 7.5* 6.4* 8.9*  HCT 23.5* 19.8* 27.2*  PLT 364 376 510*   BMET Recent Labs    12/30/18 0354 12/31/18 0640  NA 135 137  K 4.3 4.0  CL 102 105  CO2 27 24  GLUCOSE 181* 125*  BUN 32* 32*  CREATININE 1.18 1.10  CALCIUM 7.9* 7.9*   LFT No results for input(s): PROT, ALBUMIN, AST, ALT, ALKPHOS, BILITOT, BILIDIR, IBILI in the last 72 hours. PT/INR No results for input(s): LABPROT, INR in the last 72 hours.  STUDIES: No results found.    Impression / Plan:   Assessment: Active Problems:   Acute respiratory failure ([place])   Sacral decubitus ulcer, stage II ([place])   [name] is a 84 y.o. y/o male with a hemoglobin that trended down from 11.0 on 8 5 down to 6.4 on 8 9.  The patient's hemoglobin this morning was 8.9 after transfusion.  The patient is not a great historian but states that he does not believe he is ever  had an EGD or colonoscopy and there are no records in the chart to suggest he has had any of these.  Plan:  The patient will be set up for an EGD and colonoscopy to look for the source of his anemia and heme positive stools.  He will be given a prep  today for his colonoscopy.  The patient has been encouraged to take his entire prep.  The patient has been explained the plan and agrees with it.  Thank you for involving me in the care of this patient.      LOS: 12 days   Lucilla Lame, MD  01/01/2019, 2:45 PM    Note: This dictation was prepared with Dragon dictation along with smaller phrase technology. Any transcriptional errors that result from this process are unintentional.

## 2019-01-01 NOTE — Progress Notes (Signed)
ID  Pt seen with vascular team C/o pain rt knee Chest CTA Hss1s2 Abd soft  Patient Vitals for the past 24 hrs:  BP Temp Temp src Pulse Resp SpO2  01/01/19 0837 (!) 154/59 99.4 F (37.4 C) Oral 70 (!) 22 95 %  01/01/19 0026 (!) 153/62 (!) 100.9 F (38.3 C) - 71 17 92 %  12/31/18 1527 (!) 151/80 98 F (36.7 C) Oral 69 20 97 %  12/31/18 1330 (!) 127/52 98.2 F (36.8 C) Oral 66 16 97 %  12/31/18 1119 (!) 158/62 98 F (36.7 C) Oral 74 18 96 %  12/31/18 1057 (!) 125/50 98.5 F (36.9 C) Oral 64 16 96 %    O/e No distress unless when we try to straighten the rt stump  Rt stump surgical site well coapted- bloody drainage, some bruising      CBC Latest Ref Rng & Units 01/01/2019 12/31/2018 12/30/2018  WBC 4.0 - 10.5 K/uL 16.6(H) 14.3(H) 19.3(H)  Hemoglobin 13.0 - 17.0 g/dL 8.9(L) 6.4(L) 7.5(L)  Hematocrit 39.0 - 52.0 % 27.2(L) 19.8(L) 23.5(L)  Platelets 150 - 400 K/uL 510(H) 376 364     Impression/recommendation  PAD with rt heel necrotic wound- s/p BKA Stump site looks okay no infection currently- bloody drainage could be from hematoma  Proteus bacteremia- adequately treated - can DC zosyn today  Pseudomonas, MRSA, ESBL e.coli in the heel wound- therapeutic cure with BKA  Low grade fever and increased wbc could be from hematoma- no evidence clinically of pneumonia or UTI Will watch off antibiotics as he is stable Will order blood culture, UA if he continues to spike fever  Discussed the management with the vascular team and the patient

## 2019-01-01 NOTE — Care Management Important Message (Signed)
Important Message  Patient Details  Name: [name] MRN: [id number] Date of Birth: 09/01/1934   Medicare Important Message Given:  Yes     Su Hilt, RN 01/01/2019, 11:41 AM

## 2019-01-01 NOTE — Progress Notes (Signed)
[facility] Daily Progress Note   Subjective: 12/27/18: Right below-the-knee amputation  12/18/18: 1.Ultrasound guidance for vascular accessleftfemoral artery 2.Catheter placement into right common femoral artery from left femoral approach 3.Aortogram and selectiverightlower extremity angiogram 4.Percutaneous transluminal angioplasty ofright anterior tibial artery with 3 mm diameter angioplasty balloon 5.Percutaneous transluminal angioplasty of the right SFA and proximal popliteal artery with a 5 mm diameter by 30 and a 5 mm diameter by 10 cm length Lutonix drug-coated angioplasty balloon 6.Percutaneous transluminal angioplasty of the right tibioperoneal trunk and proximal peroneal artery with 3 mm diameter angioplasty balloon 7.Viabahn stent placement to the right mid to distal SFA and proximal popliteal artery with a 6 mm diameter by 25 cm length stent 8.StarClose closure device leftfemoral artery  Patient still with right lower extremity stump pain.  Objective: Vitals:   12/31/18 1330 12/31/18 1527 01/01/19 0026 01/01/19 0837  BP: (!) 127/52 (!) 151/80 (!) 153/62 (!) 154/59  Pulse: 66 69 71 70  Resp: 16 20 17  (!) 22  Temp: 98.2 F (36.8 C) 98 F (36.7 C) (!) 100.9 F (38.3 C) 99.4 F (37.4 C)  TempSrc: Oral Oral  Oral  SpO2: 97% 97% 92% 95%  Weight:      Height:        Intake/Output Summary (Last 24 hours) at 01/01/2019 1113 Last data filed at 01/01/2019 0300 Gross per 24 hour  Intake 49.94 ml  Output 400 ml  Net -350.06 ml   Physical Exam: A&Ox2, NAD CV: Irregularly irregular Pulmonary: CTA Bilaterally Abdomen: Soft, Nontender, Nondistended Vascular:  Right Lower Extremity: Thigh soft, unable to straighten at the knee, dressing removed. Stump clean, dry and intact. No drainage. Warm.     Photos    01/01/2019 10:51  Attached  To:  Hospital Encounter on 12/20/18  Source Information Tsosie Billing, MD  [facility] (1a)     Photos    01/01/2019 10:51  Attached To:  Hospital Encounter on 12/20/18   Tsosie Billing, MD  [facility] (1a)    Laboratory: CBC    Component Value Date/Time   WBC 16.6 (H) 01/01/2019 0444   HGB 8.9 (L) 01/01/2019 0444   HGB 16.3 01/20/2013 0255   HCT 27.2 (L) 01/01/2019 0444   HCT 47.9 01/20/2013 0255   PLT 510 (H) 01/01/2019 0444   PLT 245 01/20/2013 0255   BMET    Component Value Date/Time   NA 137 12/31/2018 0640   NA 140 01/20/2013 0255   K 4.0 12/31/2018 0640   K 3.5 01/20/2013 0255   CL 105 12/31/2018 0640   CL 103 01/20/2013 0255   CO2 24 12/31/2018 0640   CO2 30 01/20/2013 0255   GLUCOSE 125 (H) 12/31/2018 0640   GLUCOSE 145 (H) 01/20/2013 0255   BUN 32 (H) 12/31/2018 0640   BUN 22 (H) 01/20/2013 0255   CREATININE 1.10 12/31/2018 0640   CREATININE 1.09 01/20/2013 0255   CALCIUM 7.9 (L) 12/31/2018 0640   CALCIUM 9.4 01/20/2013 0255   GFRNONAA >60 12/31/2018 0640   GFRNONAA >60 01/20/2013 0255   GFRAA >60 12/31/2018 0640   GFRAA >60 01/20/2013 0255   Assessment/Planning: The patient is an 84 year old male with multiple medical issues including peripheral artery disease status post a right lower extremity endovascular intervention on December 18, 2018 and postop day #5 from a right below the knee amputation  1) Stump healthy. No signs of infection 2) Unable to straighten from the knee joint. If continues to contract will need revision to an  AKA. This has been discussed with the patient multiple times 3) PT / OT 4) Will place knee immobilizer to try to help straighten knee  Seen and discussed with Dr. Ellis Parents Raylan Hanton PA-C 01/01/2019 11:13 AM

## 2019-01-01 NOTE — Progress Notes (Signed)
Physical Therapy Treatment Patient Details Name: [name] MRN: [id number] DOB: 13-Oct-1934 Today's Date: 01/01/2019    History of Present Illness Pt admitted for ARF with complaints of SOB and non healing R heel wound. Pt has had complicated hospital stay and ended up requiring R BKA secondary to gangrene on 12/27/18. PMH includes Afib, CAD, cardiomyopathy, HTN, L BKA.    PT Comments    Nursing techs just finishing cleaning pt up in bed upon PT arrival.  Pt reporting 10/10 pain in R residual LE limb and also sacral area (nurse notified of pt's request for pain meds) and pt refusing any mobility d/t this pain.   Pt agreeable to LE ex's in bed (tolerated L LE fairly well).  Minimal movement able to be performed with R residual LE limb hip flexion (with assist from therapist) d/t significant R LE residual limb pain with any movement.  Attempted to gently straighten pt's R knee but unable to move at all d/t significant R LE residual limb pain (significant R knee flexion noted--anticipate R knee extension limited d/t pain and possible contracture)--therapist discussed with vascular MD who was present to see pt.  Will continue to focus on strengthening, R LE ROM (especially focusing on R knee extension), and progressive functional mobility per pt tolerance.   Follow Up Recommendations  SNF     Equipment Recommendations  None recommended by PT    Recommendations for Other Services       Precautions / Restrictions Precautions Precautions: Fall Restrictions Weight Bearing Restrictions: Yes RLE Weight Bearing: Non weight bearing LLE Weight Bearing: (BKA with prosthesis) Other Position/Activity Restrictions: L BKA and now R BKA    Mobility  Bed Mobility               General bed mobility comments: Pt refused any mobility d/t significant pain with any mobility  Transfers                    Ambulation/Gait                 Stairs              Wheelchair Mobility    Modified Rankin (Stroke Patients Only)       Balance                                            Cognition Arousal/Alertness: Awake/alert Behavior During Therapy: WFL for tasks assessed/performed Overall Cognitive Status: Within Functional Limits for tasks assessed                                 General Comments: [facility]      Exercises General Exercises - Lower Extremity Short Arc Quad: AROM;Strengthening;Left;10 reps;Supine Heel Slides: AROM;Strengthening;Left;10 reps;Supine(hip and knee flexion) Hip ABduction/ADduction: AROM;Strengthening;Left;10 reps;Supine Straight Leg Raises: AROM;Strengthening;Left;10 reps;Supine    General Comments General comments (skin integrity, edema, etc.): Pt's R knee significantly flexed and unable to straighten d/t significant pain (mild drainage noted on pillow under R residual limb bandaging)      Pertinent Vitals/Pain Pain Assessment: 0-10 Pain Score: 10-Worst pain ever Pain Location: R residual limb with any movement; sacral Pain Descriptors / Indicators: Operative site guarding;Grimacing;Guarding;Tender Pain Intervention(s): Limited activity within patient's tolerance;Monitored during session;Repositioned;Patient requesting pain meds-RN notified    Home Living  Prior Function            PT Goals (current goals can now be found in the care plan section) Acute Rehab PT Goals Patient Stated Goal: to get stronger PT Goal Formulation: With patient Time For Goal Achievement: 01/12/19 Potential to Achieve Goals: Fair Progress towards PT goals: Progressing toward goals    Frequency    7X/week      PT Plan Current plan remains appropriate    Co-evaluation              AM-PAC PT "6 Clicks" Mobility   Outcome Measure  Help needed turning from your back to your side while in a flat bed without using bedrails?: A Lot Help needed moving  from lying on your back to sitting on the side of a flat bed without using bedrails?: Total Help needed moving to and from a bed to a chair (including a wheelchair)?: Total Help needed standing up from a chair using your arms (e.g., wheelchair or bedside chair)?: Total Help needed to walk in hospital room?: Total Help needed climbing 3-5 steps with a railing? : Total 6 Click Score: 7    End of Session   Activity Tolerance: Patient limited by pain Patient left: in bed;with call bell/phone within reach;with bed alarm set(Vascular MD present for dressing change) Nurse Communication: Precautions;Weight bearing status;Patient requests pain meds PT Visit Diagnosis: Muscle weakness (generalized) (M62.81);Difficulty in walking, not elsewhere classified (R26.2);Pain Pain - Right/Left: Right Pain - part of body: Leg     Time: 7482-7078 PT Time Calculation (min) (ACUTE ONLY): 13 min  Charges:  $Therapeutic Exercise: 8-22 mins                    Leitha Bleak, PT 01/01/19, 12:05 PM [phone]

## 2019-01-02 ENCOUNTER — Encounter: Payer: Self-pay | Admitting: *Deleted

## 2019-01-02 ENCOUNTER — Inpatient Hospital Stay: Payer: Medicare Other | Admitting: Anesthesiology

## 2019-01-02 ENCOUNTER — Encounter
Admission: EM | Disposition: A | Discharge status: Skilled Nursing Facility | Payer: Self-pay | Source: Home / Self Care | Attending: Internal Medicine

## 2019-01-02 DIAGNOSIS — D649 Anemia, unspecified: Secondary | ICD-10-CM

## 2019-01-02 HISTORY — PX: COLONOSCOPY WITH PROPOFOL: SHX5780

## 2019-01-02 HISTORY — PX: ESOPHAGOGASTRODUODENOSCOPY (EGD) WITH PROPOFOL: SHX5813

## 2019-01-02 LAB — CBC
HCT: 29 % — ABNORMAL LOW (ref 39.0–52.0)
Hemoglobin: 9.4 g/dL — ABNORMAL LOW (ref 13.0–17.0)
MCH: 29.1 pg (ref 26.0–34.0)
MCHC: 32.4 g/dL (ref 30.0–36.0)
MCV: 89.8 fL (ref 80.0–100.0)
Platelets: 492 10*3/uL — ABNORMAL HIGH (ref 150–400)
RBC: 3.23 MIL/uL — ABNORMAL LOW (ref 4.22–5.81)
RDW: 14.6 % (ref 11.5–15.5)
WBC: 15.7 10*3/uL — ABNORMAL HIGH (ref 4.0–10.5)
nRBC: 0 % (ref 0.0–0.2)

## 2019-01-02 LAB — GLUCOSE, CAPILLARY
Glucose-Capillary: 99 mg/dL (ref 70–99)
Glucose-Capillary: 97 mg/dL (ref 70–99)
Glucose-Capillary: 91 mg/dL (ref 70–99)
Glucose-Capillary: 87 mg/dL (ref 70–99)
Glucose-Capillary: 83 mg/dL (ref 70–99)
Glucose-Capillary: 189 mg/dL — ABNORMAL HIGH (ref 70–99)

## 2019-01-02 SURGERY — ESOPHAGOGASTRODUODENOSCOPY (EGD) WITH PROPOFOL
Anesthesia: General

## 2019-01-02 MED ORDER — POLYETHYLENE GLYCOL 3350 17 GM/SCOOP PO POWD
1.0000 | Freq: Once | ORAL | Status: AC
  Administered 2019-01-03: 255 g via ORAL
  Filled 2019-01-02: qty 255

## 2019-01-02 MED ORDER — PROPOFOL 10 MG/ML IV BOLUS
INTRAVENOUS | Status: AC
  Filled 2019-01-02: qty 20

## 2019-01-02 MED ORDER — PROPOFOL 10 MG/ML IV BOLUS
INTRAVENOUS | Status: AC
  Filled 2019-01-02: qty 40

## 2019-01-02 MED ORDER — LIDOCAINE HCL (CARDIAC) PF 100 MG/5ML IV SOSY
PREFILLED_SYRINGE | INTRAVENOUS | Status: DC | PRN
  Administered 2019-01-02: 60 mg via INTRATRACHEAL

## 2019-01-02 MED ORDER — PROPOFOL 10 MG/ML IV BOLUS
INTRAVENOUS | Status: DC | PRN
  Administered 2019-01-02: 40 mg via INTRAVENOUS
  Administered 2019-01-02: 10 mg via INTRAVENOUS

## 2019-01-02 MED ORDER — SODIUM CHLORIDE 0.9 % IV SOLN
INTRAVENOUS | Status: DC
  Administered 2019-01-02: 1000 mL via INTRAVENOUS

## 2019-01-02 MED ORDER — SODIUM CHLORIDE 0.9 % IV SOLN
INTRAVENOUS | Status: AC
  Administered 2019-01-02 – 2019-01-03 (×2): via INTRAVENOUS

## 2019-01-02 NOTE — Progress Notes (Signed)
ID C/o rt knee pain- rt leg/stump in immobilizer Had endoscopy today No fever Patient Vitals for the past 24 hrs:  BP Temp Temp src Pulse Resp SpO2  01/02/19 2019 (!) 145/62 98.2 F (36.8 C) Oral 73 18 92 %  01/02/19 1617 (!) 138/55 98.9 F (37.2 C) Oral 74 20 94 %  01/02/19 1534 (!) 161/69 - - 74 (!) 22 96 %  01/02/19 1524 (!) 157/68 - - 72 20 100 %  01/02/19 1514 138/65 - - 74 (!) 21 92 %  01/02/19 1505 (!) 138/58 (!) 97.4 F (36.3 C) - 72 19 96 %  01/02/19 1504 (!) 138/58 (!) 97.4 F (36.3 C) Tympanic 70 19 96 %  01/02/19 1440 (!) 167/66 - Tympanic 71 18 95 %  01/02/19 1439 - 98.2 F (36.8 C) Tympanic - - -  01/02/19 0812 (!) 153/67 98.6 F (37 C) - 72 18 95 %  01/01/19 2350 (!) 144/56 98.8 F (37.1 C) Oral 65 - 93 %   Awake and alert Rt thigh and knee immobilizer  CBC Latest Ref Rng & Units 01/02/2019 01/01/2019 12/31/2018  WBC 4.0 - 10.5 K/uL 15.7(H) 16.6(H) 14.3(H)  Hemoglobin 13.0 - 17.0 g/dL 9.4(L) 8.9(L) 6.4(L)  Hematocrit 39.0 - 52.0 % 29.0(L) 27.2(L) 19.8(L)  Platelets 150 - 400 K/uL 492(H) 510(H) 376   CMP Latest Ref Rng & Units 12/31/2018 12/30/2018 12/29/2018  Glucose 70 - 99 mg/dL 125(H) 181(H) 159(H)  BUN 8 - 23 mg/dL 32(H) 32(H) 20  Creatinine 0.61 - 1.24 mg/dL 1.10 1.18 1.20  Sodium 135 - 145 mmol/L 137 135 136  Potassium 3.5 - 5.1 mmol/L 4.0 4.3 4.5  Chloride 98 - 111 mmol/L 105 102 104  CO2 22 - 32 mmol/L 24 27 27   Calcium 8.9 - 10.3 mg/dL 7.9(L) 7.9(L) 7.9(L)  Total Protein 6.5 - 8.1 g/dL - - -  Total Bilirubin 0.3 - 1.2 mg/dL - - -  Alkaline Phos 38 - 126 U/L - - -  AST 15 - 41 U/L - - -  ALT 0 - 44 U/L - - -    Impression/recommendation PAD with rt heel necrotic wound- s/p BKA  Stump site looks okay no infection currently- bloody drainage could be from hematoma  Proteus bacteremia- adequately treated - zosyn DC on 01/01/19  Pseudomonas, MRSA, ESBL e.coli in the heel wound- therapeutic cure with BKA  Low grade fever has resolved  and increased  wbc could be from hematoma- no evidence clinically of pneumonia or UTI Will watch off antibiotics as he is stable  Anemia- being worked up- had endoscopy ( mild gastritis/HH  Discussed the management with the patient

## 2019-01-02 NOTE — Transfer of Care (Signed)
Immediate Anesthesia Transfer of Care Note  Patient: [name]  Procedure(s) Performed: ESOPHAGOGASTRODUODENOSCOPY (EGD) WITH PROPOFOL (N/A ) COLONOSCOPY WITH PROPOFOL (N/A )  Patient Location: PACU  Anesthesia Type:General  Level of Consciousness: sedated  Airway & Oxygen Therapy: Patient Spontanous Breathing and Patient connected to face mask oxygen  Post-op Assessment: Report given to RN and Post -op Vital signs reviewed and stable  Post vital signs: Reviewed and stable  Last Vitals:  Vitals Value Taken Time  BP 138/58 01/02/19 1505  Temp 36.3 C 01/02/19 1505  Pulse 72 01/02/19 1505  Resp 19 01/02/19 1505  SpO2 96 % 01/02/19 1505    Last Pain:  Vitals:   01/02/19 1505  TempSrc:   PainSc: 0-No pain      Patients Stated Pain Goal: 0 (18/98/42 1031)  Complications: No apparent anesthesia complications

## 2019-01-02 NOTE — Anesthesia Postprocedure Evaluation (Signed)
Anesthesia Post Note  Patient: Hughie Melroy  Procedure(s) Performed: ESOPHAGOGASTRODUODENOSCOPY (EGD) WITH PROPOFOL (N/A ) COLONOSCOPY WITH PROPOFOL (N/A )  Patient location during evaluation: Endoscopy Anesthesia Type: General Level of consciousness: awake and alert and oriented Pain management: pain level controlled Vital Signs Assessment: post-procedure vital signs reviewed and stable Respiratory status: spontaneous breathing, nonlabored ventilation and respiratory function stable Cardiovascular status: blood pressure returned to baseline and stable Postop Assessment: no signs of nausea or vomiting Anesthetic complications: no     Last Vitals:  Vitals:   01/02/19 1505 01/02/19 1514  BP: (!) 138/58 138/65  Pulse: 72 74  Resp: 19 (!) 21  Temp: (!) 36.3 C   SpO2: 96% 92%    Last Pain:  Vitals:   01/02/19 1514  TempSrc:   PainSc: 0-No pain                 Jasreet Dickie

## 2019-01-02 NOTE — Progress Notes (Signed)
[facility] Daily Progress Note   Subjective: 12/27/18: Right below-the-knee amputation  12/18/18: 1.Ultrasound guidance for vascular accessleftfemoral artery 2.Catheter placement into right common femoral artery from left femoral approach 3.Aortogram and selectiverightlower extremity angiogram 4.Percutaneous transluminal angioplasty ofright anterior tibial artery with 3 mm diameter angioplasty balloon 5.Percutaneous transluminal angioplasty of the right SFA and proximal popliteal artery with a 5 mm diameter by 30 and a 5 mm diameter by 10 cm length Lutonix drug-coated angioplasty balloon 6.Percutaneous transluminal angioplasty of the right tibioperoneal trunk and proximal peroneal artery with 3 mm diameter angioplasty balloon 7.Viabahn stent placement to the right mid to distal SFA and proximal popliteal artery with a 6 mm diameter by 25 cm length stent 8.StarClose closure device leftfemoral artery  Patient more alert this AM. No issues overnight. For EDG / colonoscopy in the future.   Objective: Vitals:   01/01/19 0837 01/01/19 1509 01/01/19 2350 01/02/19 0812  BP: (!) 154/59 (!) 146/60 (!) 144/56 (!) 153/67  Pulse: 70 70 65 72  Resp: (!) 22 (!) 24  18  Temp: 99.4 F (37.4 C) 99 F (37.2 C) 98.8 F (37.1 C) 98.6 F (37 C)  TempSrc: Oral Oral Oral   SpO2: 95% 94% 93% 95%  Weight:      Height:        Intake/Output Summary (Last 24 hours) at 01/02/2019 1115 Last data filed at 01/01/2019 1529 Gross per 24 hour  Intake -  Output 100 ml  Net -100 ml   Physical Exam: A&Ox1, NAD CV: Irregularly irregular Pulmonary: CTA Bilaterally Abdomen: Soft, Nontender, Nondistended Vascular:  Right lower extremity: Knee braces on.  Dressing is clean and dry.  Stump is warm and healthy.   Laboratory: CBC    Component Value Date/Time   WBC 15.7 (H)  01/02/2019 0332   HGB 9.4 (L) 01/02/2019 0332   HGB 16.3 01/20/2013 0255   HCT 29.0 (L) 01/02/2019 0332   HCT 47.9 01/20/2013 0255   PLT 492 (H) 01/02/2019 0332   PLT 245 01/20/2013 0255   BMET    Component Value Date/Time   NA 137 12/31/2018 0640   NA 140 01/20/2013 0255   K 4.0 12/31/2018 0640   K 3.5 01/20/2013 0255   CL 105 12/31/2018 0640   CL 103 01/20/2013 0255   CO2 24 12/31/2018 0640   CO2 30 01/20/2013 0255   GLUCOSE 125 (H) 12/31/2018 0640   GLUCOSE 145 (H) 01/20/2013 0255   BUN 32 (H) 12/31/2018 0640   BUN 22 (H) 01/20/2013 0255   CREATININE 1.10 12/31/2018 0640   CREATININE 1.09 01/20/2013 0255   CALCIUM 7.9 (L) 12/31/2018 0640   CALCIUM 9.4 01/20/2013 0255   GFRNONAA >60 12/31/2018 0640   GFRNONAA >60 01/20/2013 0255   GFRAA >60 12/31/2018 0640   GFRAA >60 01/20/2013 0255   Assessment/Planning: The patient is an 84 year old male with multiple medical issues including peripheral artery disease status post a right lower extremity endovascular intervention on December 18, 2018 and postop day #6 from a right below the knee amputation  1) patient now with knee immobilizer.  Hopefully this will prevent further contracture at the knee. 2) patient to continue working with PT/OT 3) we will continue to follow  Discussed with Dr. [facility] PA-C 01/02/2019 11:15 AM

## 2019-01-02 NOTE — Progress Notes (Signed)
[place] [facility] [place] NAME: [name]    MR#:  [id number]  DATE OF BIRTH:  April 22, 1935  SUBJECTIVE:  CHIEF COMPLAINT:   Chief Complaint  Patient presents with  . Shortness of Breath   Patient has right stump pain.  He had fever last night, leukocytosis is worsening. REVIEW OF SYSTEMS:  Review of Systems  Constitutional: Positive for malaise/fatigue. Negative for chills and fever.  HENT: Negative for hearing loss and tinnitus.   Eyes: Negative for blurred vision and double vision.  Respiratory: Negative for cough and shortness of breath.   Cardiovascular: Negative for chest pain and palpitations.  Gastrointestinal: Negative for abdominal pain, heartburn, nausea and vomiting.  Genitourinary: Negative for dysuria and urgency.  Musculoskeletal: Negative for neck pain.  Skin: Negative for itching and rash.  Neurological: Negative for dizziness and headaches.  Psychiatric/Behavioral: Negative for depression and hallucinations.     DRUG ALLERGIES:   Allergies  Allergen Reactions  . Contrast Media [Iodinated Diagnostic Agents] Shortness Of Breath    SOB  . Iohexol Shortness Of Breath     Desc: Respiratory Distress, Laryngedema, diaphoresis, Onset Date: [id number]   . Metrizamide Shortness Of Breath    SOB SOB SOB   . Latex Rash   VITALS:  Blood pressure (!) 153/67, pulse 72, temperature 98.6 F (37 C), resp. rate 18, height 5\' 7"  (1.702 m), weight 79.9 kg, SpO2 95 %. PHYSICAL EXAMINATION:  Physical Exam  GENERAL:84 y.o.-year-old patient lying in the bed, feels miserable secondary to right foot pain EYES: Pupils equal, round, reactive to light and accommodation. No scleral icterus. Extraocular muscles intact.  HEENT: Head atraumatic, normocephalic. Oropharynx and nasopharynx clear.  NECK: Supple, no jugular venous distention. No thyroid enlargement, no tenderness.  LUNGS: Normal breath sounds bilaterally,bibasilar  crackles,no wheezing, rhonchi or crepitation. No use of accessory muscles of respiration.  CARDIOVASCULAR: S1, S2 normal. No rubs, or gallops. 3/6 systolic murmur present ABDOMEN: Soft, nontender, nondistended. Bowel sounds present. No organomegaly or mass.  EXTREMITIES:s/p left BKA previously and now status post recent right BKA on 12/27/2018.  Stump in dressing. NEUROLOGIC: Awake and alert and oriented.  No focal deficit.   PSYCHIATRIC: Patient awake and alert and oriented. SKIN: No obvious rash, lesion.  LABORATORY PANEL:  Male CBC Recent Labs  Lab 01/02/19 0332  WBC 15.7*  HGB 9.4*  HCT 29.0*  PLT 492*   ------------------------------------------------------------------------------------------------------------------ Chemistries  Recent Labs  Lab 12/30/18 0354 12/31/18 0640  NA 135 137  K 4.3 4.0  CL 102 105  CO2 27 24  GLUCOSE 181* 125*  BUN 32* 32*  CREATININE 1.18 1.10  CALCIUM 7.9* 7.9*  MG 2.1  --    RADIOLOGY:  No results found. ASSESSMENT AND PLAN:   [name] a83 y.o.malewith a known history of atrial fibrillation on Xarelto, CAD, ischemic cardiomyopathy with EF of 35 to 40%, hypertension, peripheral vascular disease status post left BKA and right leg angioplasty 2 days prior to this admission who presented to hospital secondary to confusion and worsening shortness of breath   1. Acute hypoxic respiratory failure-secondary to acute pulmonary edema. -Acute on chronic systolic heart failure. Last cardiac cath was in January 2020 showing chronic occlusions and collaterals from LAD. Medical management recommended. EF was around 35%. -Patient was admitted on high flow nasal cannula, much improved with IV diuresis. -Renal function slowly improving  -Currently on 2 L oxygen which is acute.  Wean off oxygen as tolerated.   Off  Lasix due to recent acute kidney injury.  2. Sepsis-present on admission.  Secondary to Diabetic right heel  ulcer-appears necrotic, ischemic ulcer. Status post recent revascularization and arterial stents placement on 12/18/2018. -Appreciate ID, podiatry and vascular consult. -Appreciate podiatry, ID and vascular consult. .  Blood cultures growing Proteus mirabilis.  Wound cultures so far growing Pseudomonas and MRSA.  IV Zosyn previously changed to meropenem.  Patient also on vancomycin. -MRI of the ankle -showing large wound in the posterior aspect of the heel, septic Achilles tendinopathy and possible abscess and pre-Achilles region with osteomyelitis of the calcaneus with cellulitis and mild fasciitis. Patient status post debridement in the OR on 12/23/2018 and wound VAC was applied.  However due to progression of gangrene patient subsequently had right BKA done on 12/27/2018  Still leukocytosis, changed to IV Zosyn per Dr. Erline Hau.. Continue local wound care.   3. Diabetes mellitus- A1c is 8.9,  Continue current regimen of Lantus insulin. Continue sliding scale insulin coverage and monitor.  4. A. fib-rate controlled. Continue cardiac medications. Xarelto resumed.  Hold Xarelto due to positive FOBT PE and anemia.  5. ARF onCKD stage III-stable.   6. Acute metabolic encephalopathy from underlying infection and pain medications. Significantly improved.  Anemia of chronic disease and due to acute blood loss.  Hemoglobin decreased to 6.4.  Increased to 9.4. S/P 1 unit PRBC transfusion.  Follow-up CBC.  Positive FOTB and anemia. Hold Xarelto.  Endoscopy today.  Dr. Nehemiah Massed got clearance for endoscopy.  DVT prophylaxis: SCD. Physical therapy suggest skilled nursing facility placement. Wheelchair-bound with walker transfers at baseline All the records are reviewed and case discussed with Care Management/Social Worker. Management plans discussed with the patient, family and they are in agreement. I called patient's wife and I discussed with his wife about his current condition and  treatment plan.  CODE STATUS: Full Code  TOTAL TIME TAKING CARE OF THIS PATIENT: 36 minutes.   More than 50% of the time was spent in counseling/coordination of care: YES  POSSIBLE D/C IN 3 DAYS, DEPENDING ON CLINICAL CONDITION.   [name] M.D on 01/02/2019 at 12:55 PM  Between 7am to 6pm - Pager - [phone]  After 6pm go to [email or web address] - Proofreader  [facility] [place] [facility]  Office  [phone]  CC: Primary care physician; Corey Skains, MD  Note: This dictation was prepared with Dragon dictation along with smaller phrase technology. Any transcriptional errors that result from this process are unintentional.

## 2019-01-02 NOTE — Anesthesia Preprocedure Evaluation (Signed)
Anesthesia Evaluation  Patient identified by MRN, date of birth, ID band Patient awake    Reviewed: Allergy & Precautions, NPO status , Patient's Chart, lab work & pertinent test results  History of Anesthesia Complications Negative for: history of anesthetic complications  Airway Mallampati: II  TM Distance: >3 FB Neck ROM: Full    Dental  (+) Upper Dentures   Pulmonary sleep apnea , neg COPD, former smoker,    breath sounds clear to auscultation- rhonchi (-) wheezing      Cardiovascular hypertension, Pt. on medications + Past MI, + Peripheral Vascular Disease and +CHF  (-) Cardiac Stents and (-) CABG  Rhythm:Regular Rate:Normal - Systolic murmurs and - Diastolic murmurs    Neuro/Psych neg Seizures negative neurological ROS  negative psych ROS   GI/Hepatic negative GI ROS, Neg liver ROS,   Endo/Other  diabetes, Insulin Dependent  Renal/GU Renal InsufficiencyRenal disease     Musculoskeletal  (+) Arthritis ,   Abdominal (+) - obese,   Peds  Hematology  (+) anemia ,   Anesthesia Other Findings Past Medical History: No date: Arthritis No date: Atrial fibrillation (HCC) No date: Carcinoma of prostate (HCC) No date: CHF (congestive heart failure) (HCC) No date: Chronic kidney disease No date: Diabetes mellitus without complication (HCC) No date: ED (erectile dysfunction) No date: Frequent urination No date: Hyperlipidemia No date: Hypertension No date: Moderate mitral insufficiency No date: Peripheral vascular disease ([place]) 04/2016: Pneumonia No date: Prostate cancer ([place]) No date: Sleep apnea     Comment:  OSA--USE C-PAP No date: Ulcer of left foot due to type 2 diabetes mellitus ([place]) No date: Urinary stress incontinence, male   Reproductive/Obstetrics                             Anesthesia Physical Anesthesia Plan  ASA: III  Anesthesia Plan: General   Post-op Pain  Management:    Induction: Intravenous  PONV Risk Score and Plan: 1 and Propofol infusion  Airway Management Planned: Natural Airway  Additional Equipment:   Intra-op Plan:   Post-operative Plan:   Informed Consent: I have reviewed the patients History and Physical, chart, labs and discussed the procedure including the risks, benefits and alternatives for the proposed anesthesia with the patient or authorized representative who has indicated his/her understanding and acceptance.     Dental advisory given  Plan Discussed with: CRNA and Anesthesiologist  Anesthesia Plan Comments:         Anesthesia Quick Evaluation

## 2019-01-02 NOTE — Progress Notes (Signed)
Physical Therapy Treatment Patient Details Name: [name] MRN: [id number] DOB: [birth year] Today's Date: 01/02/2019    History of Present Illness Pt admitted for ARF with complaints of SOB and non healing R heel wound. Pt has had complicated hospital stay and ended up requiring R BKA secondary to gangrene on 12/27/18. PMH includes Afib, CAD, cardiomyopathy, HTN, L BKA.    PT Comments    Pt reporting 8/10 R LE residual limb pain upon PT arrival but pt agreeable to trying to get OOB (pt's wife left beginning of session and present again end of session).  Therapist assisted pt with bringing hips towards edge of bed (using bed sheet) and also assisting R LE towards edge of bed but pt c/o significant pain in R LE residual limb and unable to tolerate so trialed ex's in bed instead.  Pt very limited with R LE residual limb ex's d/t significant pain with any movement.  Extra time spent during session in order to attempt to minimize pain but pt reporting 10/10 R LE residual limb pain end of session (nurse notified of pt's 10/10 pain and request for pain meds).  Will continue to attempt LE ex's and functional mobility per pt tolerance next session.   Follow Up Recommendations  SNF     Equipment Recommendations  None recommended by PT    Recommendations for Other Services       Precautions / Restrictions Precautions Precautions: Fall Restrictions Weight Bearing Restrictions: Yes RLE Weight Bearing: Non weight bearing LLE Weight Bearing: (L BKA with prosthesis) Other Position/Activity Restrictions: L BKA and now R BKA    Mobility  Bed Mobility Overal bed mobility: Needs Assistance Bed Mobility: Supine to Sit           General bed mobility comments: able to bring R LE towards edge of bed with min to mod assist but then pt reporting too much R LE pain and requesting to bring R LE back towards middle of bed (attempted 2nd time and used sheet to scoot hips to R first but then pt  unable to tolerate R LE movement towards edge of bed).  With bed flattened, pt able to boost self up in bed using B UE's on bed rails (assist required to straighten pt's hips in bed using bed sheet though).  Transfers                    Ambulation/Gait                 Stairs             Wheelchair Mobility    Modified Rankin (Stroke Patients Only)       Balance                                            Cognition Arousal/Alertness: Awake/alert Behavior During Therapy: WFL for tasks assessed/performed Overall Cognitive Status: Within Functional Limits for tasks assessed                                 General Comments: [facility]      Exercises General Exercises - Lower Extremity Quad Sets: AROM;Strengthening;Right;5 reps;Supine Straight Leg Raises: AAROM;Strengthening;Right;5 reps;Supine Other Exercises Other Exercises: R knee AROM flexion/extension (minimal movement noted d/t pain) x5 reps    General  Comments General comments (skin integrity, edema, etc.): R LE in knee immobilizer; no drainage noted R LE residual limb      Pertinent Vitals/Pain Pain Assessment: 0-10 Pain Location: R LE residual limb Pain Descriptors / Indicators: Operative site guarding;Grimacing;Guarding;Tender Pain Intervention(s): Limited activity within patient's tolerance;Monitored during session;Repositioned;Patient requesting pain meds-RN notified    Home Living                      Prior Function            PT Goals (current goals can now be found in the care plan section) Acute Rehab PT Goals Patient Stated Goal: to get stronger PT Goal Formulation: With patient Time For Goal Achievement: 01/12/19 Potential to Achieve Goals: Fair Progress towards PT goals: Progressing toward goals    Frequency    7X/week      PT Plan Current plan remains appropriate    Co-evaluation              AM-PAC PT "6 Clicks"  Mobility   Outcome Measure  Help needed turning from your back to your side while in a flat bed without using bedrails?: A Lot Help needed moving from lying on your back to sitting on the side of a flat bed without using bedrails?: Total Help needed moving to and from a bed to a chair (including a wheelchair)?: Total Help needed standing up from a chair using your arms (e.g., wheelchair or bedside chair)?: Total Help needed to walk in hospital room?: Total Help needed climbing 3-5 steps with a railing? : Total 6 Click Score: 7    End of Session Equipment Utilized During Treatment: Right knee immobilizer Activity Tolerance: Patient limited by pain Patient left: in bed;with call bell/phone within reach;with bed alarm set(R KI in place with R LE elevated on pillow; pt's wife present) Nurse Communication: Precautions;Patient requests pain meds;Mobility status PT Visit Diagnosis: Muscle weakness (generalized) (M62.81);Difficulty in walking, not elsewhere classified (R26.2);Pain Pain - Right/Left: Right Pain - part of body: Leg     Time: 6761-9509 PT Time Calculation (min) (ACUTE ONLY): 38 min  Charges:  $Therapeutic Exercise: 8-22 mins $Therapeutic Activity: 23-37 mins                     Leitha Bleak, PT 01/02/19, 4:40 PM [phone]

## 2019-01-02 NOTE — Anesthesia Post-op Follow-up Note (Signed)
Anesthesia QCDR form completed.        

## 2019-01-02 NOTE — Consult Note (Addendum)
[facility] Cardiology  CARDIOLOGY CONSULT NOTE  Patient ID: [name] MRN: [id number] DOB/AGE: June 24, 1934 84 y.o.  Admit date: 12/20/2018 Referring Physician: [name], MD Primary Physician: [name], NP Primary Cardiologist: Serafina Royals, MD Reason for Consultation: Cardiac Clearance  HPI:  [name] is a 84 y.o. 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   Patient was originally admitted for a right foot wound debridement. After the debridement, he developed gangrene, and needed a right BKA on 08/05. Hospital course has been complicated by sepsis with positive blood cultures with growing proteus mirabilis. He has also endorsed GI bleed. He had a positive hemoccult and anemia with hemoglobin of 6.4, needing blood transfusion. GI is planning upper endoscopy for further evaluation.   Initial admission EKG showed sinus tachycardia with rate of 115 BPM with up-sloping ST depressions in V5-V6, II, III, and aVF. Patient has a history of similar up-sloping ST depression when tachycardic as documented during prior EKGs. Most recent EKG with biphasic T wave and some ST changes which are not new. Have been seen on prior EKG in 2019. Patient denies any chest pain, shortness of breath, palpitations, or lightheadedness.    Review of systems complete and found to be negative  unless listed above   Past Medical History:  Diagnosis Date  . Arthritis   . Atrial fibrillation ([place])   . Carcinoma of prostate ([place])   . CHF (congestive heart failure) ([place])   . Chronic kidney disease   . Diabetes mellitus without complication ([place])   . ED (erectile dysfunction)   . Frequent urination   . Hyperlipidemia   . Hypertension   . Moderate mitral insufficiency   . Peripheral vascular disease ([place])   . Pneumonia 04/2016  . Prostate cancer ([place])   . Sleep apnea    OSA--USE C-PAP  . Ulcer of left foot due to type 2 diabetes mellitus ([place])   . Urinary stress incontinence, male       Medications Prior to Admission  Medication Sig Dispense Refill Last Dose  . amiodarone (PACERONE) 200 MG tablet Take 200 mg by mouth daily.   12/19/2018 at 0800  . aspirin EC 81 MG tablet Take 1 tablet (81 mg total) by mouth daily. 150 tablet 2 12/19/2018 at 0800  . atorvastatin (LIPITOR) 80 MG tablet Take 80 mg by mouth every evening.   12/19/2018 at 1800  . diltiazem (CARDIZEM CD) 120 MG 24 hr capsule Take 120 mg by mouth daily.   12/19/2018 at 0800  . ferrous sulfate 325 (65 FE) MG tablet Take 1 tablet (325 mg total) by mouth daily with breakfast. 30 tablet 3 12/19/2018 at 0800  . HYDROcodone-acetaminophen (NORCO/VICODIN) 5-325 MG tablet Take 1-2 tablets by mouth every 6 (six) hours as needed for moderate pain (pain score 4-6). 30 tablet 0 12/19/2018 at 2200  . insulin glargine (LANTUS) 100 UNIT/ML injection Inject 0.15 mLs (15 Units total) into the skin at  bedtime. 1 vial 0 12/19/2018 at 2100  . insulin lispro (HUMALOG) 100 UNIT/ML injection Inject 0.03 mLs (3 Units total) into the skin 3 (three) times daily with meals. (Patient taking differently: Inject 3 Units into the skin 3 (three) times daily with meals. Plus 1 additional unit for every 50 BG points over 200 (max 5 additional units per dose)) 10 mL 0 12/19/2018 at 1800  . metoprolol tartrate (LOPRESSOR) 25 MG tablet Take 1 tablet (25 mg total)  by mouth 3 (three) times daily. 90 tablet 0 12/19/2018 at 2000  . Rivaroxaban (XARELTO) 15 MG TABS tablet Take 1 tablet (15 mg total) by mouth daily with supper. 30 tablet 1 12/19/2018 at 1700  . traMADol (ULTRAM) 50 MG tablet Take 50 mg by mouth every 6 (six) hours as needed.   12/20/2018 at [place]  . methocarbamol (ROBAXIN) 500 MG tablet Take 1 tablet (500 mg total) by mouth every 6 (six) hours as needed for muscle spasms. 15 tablet 0 unknown at prn  . nitroGLYCERIN (NITROSTAT) 0.4 MG SL tablet Place 1 tablet (0.4 mg total) under the tongue every 5 (five) minutes as needed for chest pain. 20 tablet 0 unknown at prn   Social History   Socioeconomic History  . Marital status: Married    Spouse name: Not on file  . Number of children: Not on file  . Years of education: Not on file  . Highest education level: Not on file  Occupational History  . Occupation: retired  Scientific laboratory technician  . Financial resource strain: Not on file  . Food insecurity    Worry: Not on file    Inability: Not on file  . Transportation needs    Medical: Not on file    Non-medical: Not on file  Tobacco Use  . Smoking status: Former Smoker    Packs/day: 0.25    Types: Cigarettes    Quit date: 10/14/1994    Years since quitting: 24.2  . Smokeless tobacco: Never Used  Substance and Sexual Activity  . Alcohol use: No    Alcohol/week: 0.0 standard drinks  . Drug use: No  . Sexual activity: Not on file  Lifestyle  . Physical activity    Days per week: Not on file    Minutes per session: Not on file  . Stress: Not on file  Relationships  . Social Herbalist on phone: Not on file    Gets together: Not on file    Attends religious service: Not on file    Active member of club or organization: Not on file    Attends meetings of clubs or organizations: Not on file    Relationship status: Not on file  . Intimate partner violence    Fear of current or ex partner: Not on file    Emotionally abused: Not on file     Physically abused: Not on file    Forced sexual activity: Not on file  Other Topics Concern  . Not on file  Social History Narrative   Lives at home with wife, has a wheelchair    Family History  Problem Relation Age of Onset  . Hypertension Mother   . Diabetes Mother   . Prostate cancer Neg Hx   . Bladder Cancer Neg Hx   . Kidney disease Neg Hx     Review of systems complete and found to be negative unless listed above    PHYSICAL EXAM  General: Well developed, well nourished, in  no acute distress HEENT:  Normocephalic and atramatic Neck:  No JVD.  Lungs: Clear bilaterally to auscultation and percussion. Heart: HRRR . Normal S1 and S2 without gallops or murmurs.  Extremities: Bilateral BKA. Right lower extremity bandaged and wrapped.  Neuro: Alert and oriented X 3. Psych:  Good affect, responds appropriately  Labs:   Lab Results  Component Value Date   WBC 15.7 (H) 01/02/2019   HGB 9.4 (L) 01/02/2019   HCT 29.0 (L) 01/02/2019   MCV 89.8 01/02/2019   PLT 492 (H) 01/02/2019    Recent Labs  Lab 12/31/18 0640  NA 137  K 4.0  CL 105  CO2 24  BUN 32*  CREATININE 1.10  CALCIUM 7.9*  GLUCOSE 125*   Lab Results  Component Value Date   CKTOTAL 131 10/22/2016   CKMB 1.2 01/19/2013   TROPONINI 0.26 (HH) 06/07/2018    Lab Results  Component Value Date   CHOL 146 06/06/2018   CHOL 122 10/25/2017   CHOL 108 10/16/2017   Lab Results  Component Value Date   HDL 30 (L) 06/06/2018   HDL 39 (L) 10/25/2017   HDL 37 (L) 10/16/2017   Lab Results  Component Value Date   LDLCALC 95 06/06/2018   LDLCALC 70 10/25/2017   LDLCALC 62 10/16/2017   Lab Results  Component Value Date   TRIG 104 06/06/2018   TRIG 65 10/25/2017   TRIG 47 10/16/2017   Lab Results  Component Value Date   CHOLHDL 4.9 06/06/2018   CHOLHDL 3.1 10/25/2017   CHOLHDL 2.9 10/16/2017   No results found for: LDLDIRECT   ASSESSMENT AND PLAN:  [name] is a 84 year old male with a  history significant for paroxsymal atrial fibrillation, previously anticoagulated with Xarelto, ischemic cardiomyopathy with last ECHO from 05/2018 with EF of 45% and hypokinesis of anteroseptal wall, coronary artery disease as seen on cardiac cath from 05/2018 with total occlusion of distal LM with collaterals, also with 75% stenosis to prox RCA, and 50% to LCx, now requiring cardiac clearance prior to upper endoscopy procedure to investigate source of GI bleed. He remain in sinus rhythm with a regular rate on his current A-fib regimen. No evidence of active heart failure exacerbation, myocardial infarction, or other acute cardiac process at this time.   1. Proceed to GI procedure as scheduled.  2. Continue to hold anticoagulation at this time given acute GI bleed  3. Continue amiodarone and diltiazem for maintenance of normal sinus rhythm   4. EKG with non-specific ST-T wave changes which are not clinically significant for ischemic. No further cardiac interventions indicated at this time. Please feel free to reach out with any other questions.   The patient's history and exam findings were discussed with Dr. Nehemiah Massed. The plan was made in conjunction with Dr. Nehemiah Massed.  Signed: Hilbert Odor PA-C 01/02/2019, 10:33 AM  Patient interviewed and examined and agree with above  Serafina Royals MD [facility]

## 2019-01-02 NOTE — Op Note (Signed)
[facility] Gastroenterology Patient Name: [name] Procedure Date: 01/02/2019 2:25 PM MRN: [id number] Account #: [id number] Date of Birth: January 28, 1935 Admit Type: Inpatient Age: 84 Room: [facility] ENDO ROOM 4 Gender: Male Note Status: Finalized Procedure:            Upper GI endoscopy Indications:          Iron deficiency anemia Providers:            Lucilla Lame MD, MD Medicines:            Propofol per Anesthesia Complications:        No immediate complications. Procedure:            Pre-Anesthesia Assessment:                       - Prior to the procedure, a History and Physical was                        performed, and patient medications and allergies were                        reviewed. The patient's tolerance of previous                        anesthesia was also reviewed. The risks and benefits of                        the procedure and the sedation options and risks were                        discussed with the patient. All questions were                        answered, and informed consent was obtained. Prior                        Anticoagulants: The patient has taken no previous                        anticoagulant or antiplatelet agents. ASA Grade                        Assessment: II - A patient with mild systemic disease.                        After reviewing the risks and benefits, the patient was                        deemed in satisfactory condition to undergo the                        procedure.                       After obtaining informed consent, the endoscope was                        passed under direct vision. Throughout the procedure,                        the patient's blood pressure,  pulse, and oxygen                        saturations were monitored continuously. The Endoscope                        was introduced through the mouth, and advanced to the                        second part of duodenum. The upper GI endoscopy  was                        accomplished without difficulty. The patient tolerated                        the procedure well. Findings:      A small hiatal hernia was present.      Localized mild inflammation characterized by erythema was found in the       gastric antrum.      The examined duodenum was normal. Impression:           - Small hiatal hernia.                       - Gastritis.                       - Normal examined duodenum.                       - No specimens collected. Recommendation:       - Return patient to hospital ward for ongoing care.                       - Clear liquid diet.                       - Continue present medications. Procedure Code(s):    --- Professional ---                       [phone], Esophagogastroduodenoscopy, flexible, transoral;                        diagnostic, including collection of specimen(s) by                        brushing or washing, when performed (separate procedure) Diagnosis Code(s):    --- Professional ---                       D50.9, Iron deficiency anemia, unspecified                       K29.70, Gastritis, unspecified, without bleeding CPT copyright 2019 American Medical Association. All rights reserved. The codes documented in this report are preliminary and upon coder review may  be revised to meet current compliance requirements. Lucilla Lame MD, MD 01/02/2019 3:05:03 PM This report has been signed electronically. Number of Addenda: 0 Note Initiated On: 01/02/2019 2:25 PM Estimated Blood Loss: Estimated blood loss: none.      [facility]

## 2019-01-02 NOTE — Progress Notes (Signed)
OT Cancellation Note  Patient Details Name: [name] MRN: [id number] DOB: 07-28-1934   Cancelled Treatment:    Reason Eval/Treat Not Completed: Patient at procedure or test/ unavailable. Pt out of room for EGD and colonoscopy per chart review. Unavailable for OT treatment session. Will re-attempt at later date/time as pt is available and medically appropriate.   Jeni Salles, MPH, MS, OTR/L ascom [phone] 01/02/19, 3:19 PM

## 2019-01-02 NOTE — Progress Notes (Signed)
Note entered in error.  Leitha Bleak, PT 01/02/19, 4:31 PM [phone]

## 2019-01-03 ENCOUNTER — Encounter: Payer: Self-pay | Admitting: Gastroenterology

## 2019-01-03 DIAGNOSIS — I70234 Atherosclerosis of native arteries of right leg with ulceration of heel and midfoot: Secondary | ICD-10-CM

## 2019-01-03 LAB — BASIC METABOLIC PANEL
Anion gap: 5 (ref 5–15)
BUN: 20 mg/dL (ref 8–23)
CO2: 25 mmol/L (ref 22–32)
Calcium: 7.8 mg/dL — ABNORMAL LOW (ref 8.9–10.3)
Chloride: 105 mmol/L (ref 98–111)
Creatinine, Ser: 1.15 mg/dL (ref 0.61–1.24)
GFR calc Af Amer: >60 mL/min (ref >60)
GFR calc non Af Amer: 59 mL/min — ABNORMAL LOW (ref >60)
Glucose, Bld: 98 mg/dL (ref 70–99)
Potassium: 3.9 mmol/L (ref 3.5–5.1)
Sodium: 135 mmol/L (ref 135–145)

## 2019-01-03 LAB — CBC
HCT: 25.2 % — ABNORMAL LOW (ref 39.0–52.0)
Hemoglobin: 8.2 g/dL — ABNORMAL LOW (ref 13.0–17.0)
MCH: 29.1 pg (ref 26.0–34.0)
MCHC: 32.5 g/dL (ref 30.0–36.0)
MCV: 89.4 fL (ref 80.0–100.0)
Platelets: 489 10*3/uL — ABNORMAL HIGH (ref 150–400)
RBC: 2.82 MIL/uL — ABNORMAL LOW (ref 4.22–5.81)
RDW: 14.3 % (ref 11.5–15.5)
WBC: 11.3 10*3/uL — ABNORMAL HIGH (ref 4.0–10.5)
nRBC: 0 % (ref 0.0–0.2)

## 2019-01-03 LAB — GLUCOSE, CAPILLARY
Glucose-Capillary: 101 mg/dL — ABNORMAL HIGH (ref 70–99)
Glucose-Capillary: 131 mg/dL — ABNORMAL HIGH (ref 70–99)
Glucose-Capillary: 165 mg/dL — ABNORMAL HIGH (ref 70–99)
Glucose-Capillary: 225 mg/dL — ABNORMAL HIGH (ref 70–99)
Glucose-Capillary: 240 mg/dL — ABNORMAL HIGH (ref 70–99)
Glucose-Capillary: 79 mg/dL (ref 70–99)

## 2019-01-03 LAB — MAGNESIUM: Magnesium: 2.1 mg/dL (ref 1.7–2.4)

## 2019-01-03 MED ORDER — GERHARDT'S BUTT CREAM
TOPICAL_CREAM | Freq: Two times a day (BID) | CUTANEOUS | Status: DC
  Administered 2019-01-03 – 2019-01-05 (×4): via TOPICAL
  Filled 2019-01-03: qty 1

## 2019-01-03 NOTE — Consult Note (Signed)
[place] Nurse wound consult note Reason for Consult: Deep tissue pressure injury to right buttocks.  Partial thickness tissue loss to scrotal and perineal skin from moisture associated skin damage.  Wound type:Pressure and moisture Pressure Injury POA: NO Measurement: Right buttocks:  2 cm x 2 cm maroon discoloration Scattered nonintact lesion to perineal skin, pink and moist wound bed.  Wound bed:see above Drainage (amount, consistency, odor) minimal serosanguinous no odor Periwound:intact Dressing procedure/placement/frequency: Cleanse wound to right buttocks daily with soap and water.  Apply Gerhardts butt paste twice daily and PRN soilage.  Turn and reposition every two hours  Will not follow at this time.  Please re-consult if needed.  [name] MSN, RN, FNP-BC CWON Wound, Ostomy, Continence Nurse Pager [phone]

## 2019-01-03 NOTE — Progress Notes (Signed)
PT Cancellation Note  Patient Details Name: [name] MRN: [id number] DOB: [birth year]   Cancelled Treatment:    Reason Eval/Treat Not Completed: Patient declined, no reason specified;Pain limiting ability to participate;Other (comment). Treatment attempted, as initially pt agreeable. After entering room, pt agitated/argumentative with all attempts at PT intervention. Pt reports 10/10 pain in RLE; ultimately refuses PT and expresses there is nothing PT can do for him despite education/explanation of PT interventions. Re attempt at a later time/date as the schedule allows.    Larae Grooms, PTA 01/03/2019, 12:33 PM

## 2019-01-03 NOTE — Progress Notes (Signed)
[name] at [place] NAME: [name]    MR#:  [id number]  DATE OF BIRTH:  10-18-1934  SUBJECTIVE:  CHIEF COMPLAINT:   Chief Complaint  Patient presents with  . Shortness of Breath   Patient still has right stump pain.   REVIEW OF SYSTEMS:  Review of Systems  Constitutional: Positive for malaise/fatigue. Negative for chills and fever.  HENT: Negative for hearing loss and tinnitus.   Eyes: Negative for blurred vision and double vision.  Respiratory: Negative for cough and shortness of breath.   Cardiovascular: Negative for chest pain and palpitations.  Gastrointestinal: Negative for abdominal pain, heartburn, nausea and vomiting.  Genitourinary: Negative for dysuria and urgency.  Musculoskeletal: Negative for neck pain.  Skin: Negative for itching and rash.  Neurological: Negative for dizziness and headaches.  Psychiatric/Behavioral: Negative for depression and hallucinations.     DRUG ALLERGIES:   Allergies  Allergen Reactions  . Contrast Media [Iodinated Diagnostic Agents] Shortness Of Breath    SOB  . Iohexol Shortness Of Breath     Desc: Respiratory Distress, Laryngedema, diaphoresis, Onset Date: [id number]   . Metrizamide Shortness Of Breath    SOB SOB SOB   . Latex Rash   VITALS:  Blood pressure (!) 166/66, pulse 66, temperature 98.2 F (36.8 C), resp. rate 17, height 5\' 7"  (1.702 m), weight 79.9 kg, SpO2 96 %. PHYSICAL EXAMINATION:  Physical Exam  GENERAL:84 y.o.-year-old patient lying in the bed, feels miserable secondary to right foot pain EYES: Pupils equal, round, reactive to light and accommodation. No scleral icterus. Extraocular muscles intact.  HEENT: Head atraumatic, normocephalic. Oropharynx and nasopharynx clear.  NECK: Supple, no jugular venous distention. No thyroid enlargement, no tenderness.  LUNGS: Normal breath sounds bilaterally,bibasilar crackles,no wheezing, rhonchi or crepitation. No  use of accessory muscles of respiration.  CARDIOVASCULAR: S1, S2 normal. No rubs, or gallops. 3/6 systolic murmur present ABDOMEN: Soft, nontender, nondistended. Bowel sounds present. No organomegaly or mass.  EXTREMITIES:s/p left BKA previously and now status post recent right BKA on 12/27/2018.  Stump in dressing. NEUROLOGIC: Awake and alert and oriented.  No focal deficit.   PSYCHIATRIC: Patient awake and alert and oriented. SKIN: No obvious rash, lesion.  LABORATORY PANEL:  Male CBC Recent Labs  Lab 01/03/19 0531  WBC 11.3*  HGB 8.2*  HCT 25.2*  PLT 489*   ------------------------------------------------------------------------------------------------------------------ Chemistries  Recent Labs  Lab 01/03/19 0531  NA 135  K 3.9  CL 105  CO2 25  GLUCOSE 98  BUN 20  CREATININE 1.15  CALCIUM 7.8*  MG 2.1   RADIOLOGY:  No results found. ASSESSMENT AND PLAN:   [name] a83 y.o.malewith a known history of atrial fibrillation on Xarelto, CAD, ischemic cardiomyopathy with EF of 35 to 40%, hypertension, peripheral vascular disease status post left BKA and right leg angioplasty 2 days prior to this admission who presented to hospital secondary to confusion and worsening shortness of breath   1. Acute hypoxic respiratory failure-secondary to acute pulmonary edema. -Acute on chronic systolic heart failure. Last cardiac cath was in January 2020 showing chronic occlusions and collaterals from LAD. Medical management recommended. EF was around 35%. -Patient was admitted on high flow nasal cannula, much improved with IV diuresis. -Renal function slowly improving  -Currently on 2 L oxygen which is acute.  Wean off oxygen as tolerated.   Off Lasix due to recent acute kidney injury.  2. Sepsis-present on admission.  Secondary to Diabetic right heel ulcer-appears  necrotic, ischemic ulcer. Status post recent revascularization and arterial stents placement on  12/18/2018. -Appreciate ID, podiatry and vascular consult. -Appreciate podiatry, ID and vascular consult. .  Blood cultures growing Proteus mirabilis.  Wound cultures so far growing Pseudomonas and MRSA.  IV Zosyn previously changed to meropenem.  Patient also on vancomycin. -MRI of the ankle -showing large wound in the posterior aspect of the heel, septic Achilles tendinopathy and possible abscess and pre-Achilles region with osteomyelitis of the calcaneus with cellulitis and mild fasciitis. Patient status post debridement in the OR on 12/23/2018 and wound VAC was applied.  However due to progression of gangrene patient subsequently had right BKA done on 12/27/2018  Leukocytosis improved, discontinued IV Zosyn per Dr. Erline Hau.. Continue local wound care.   3. Diabetes mellitus- A1c is 8.9,  Continue current regimen of Lantus insulin. Continue sliding scale insulin coverage and monitor.  4. A. fib-rate controlled. Continue cardiac medications. Xarelto resumed.  Hold Xarelto due to positive FOBT PE and anemia.  5. ARF onCKD stage III-stable.   6. Acute metabolic encephalopathy from underlying infection and pain medications. Significantly improved.  Anemia of chronic disease and due to acute blood loss.  Hemoglobin decreased to 6.4.  Increased to 9.4. S/P 1 unit PRBC transfusion.  Hemoglobin is 8.2 today.  Positive FOTB and anemia. Hold Xarelto.  EGD is unremarkable.  Colonoscopy tomorrow.  DVT prophylaxis: SCD. Physical therapy suggest skilled nursing facility placement. Wheelchair-bound with walker transfers at baseline All the records are reviewed and case discussed with Care Management/Social Worker. Management plans discussed with the patient, family and they are in agreement. I called patient's wife and I discussed with his wife about his current condition and treatment plan.  CODE STATUS: Full Code  TOTAL TIME TAKING CARE OF THIS PATIENT: 36 minutes.   More than 50%  of the time was spent in counseling/coordination of care: YES  POSSIBLE D/C IN 2 DAYS, DEPENDING ON CLINICAL CONDITION.   [name] M.D on 01/03/2019 at 1:33 PM  Between 7am to 6pm - Pager - [phone]  After 6pm go to [email or web address] - Proofreader  [facility]  Office  [phone]  CC: Primary care physician; Corey Skains, MD  Note: This dictation was prepared with Dragon dictation along with smaller phrase technology. Any transcriptional errors that result from this process are unintentional.

## 2019-01-03 NOTE — Progress Notes (Signed)
[facility] Daily Progress Note   Subjective: 12/27/18: Right below-the-knee amputation  12/18/18: 1.Ultrasound guidance for vascular accessleftfemoral artery 2.Catheter placement into right common femoral artery from left femoral approach 3.Aortogram and selectiverightlower extremity angiogram 4.Percutaneous transluminal angioplasty ofright anterior tibial artery with 3 mm diameter angioplasty balloon 5.Percutaneous transluminal angioplasty of the right SFA and proximal popliteal artery with a 5 mm diameter by 30 and a 5 mm diameter by 10 cm length Lutonix drug-coated angioplasty balloon 6.Percutaneous transluminal angioplasty of the right tibioperoneal trunk and proximal peroneal artery with 3 mm diameter angioplasty balloon 7.Viabahn stent placement to the right mid to distal SFA and proximal popliteal artery with a 6 mm diameter by 25 cm length stent 8.StarClose closure device leftfemoral artery  Patient continues to complain of right stump pain.  He is angry this afternoon because he asked for pain medication and did not get it.  No issues overnight.  Objective: Vitals:   01/02/19 1617 01/02/19 2019 01/03/19 0006 01/03/19 0810  BP: (!) 138/55 (!) 145/62 (!) 152/66 (!) 166/66  Pulse: 74 73 65 66  Resp: 20 18 17 17   Temp: 98.9 F (37.2 C) 98.2 F (36.8 C) 98.6 F (37 C) 98.2 F (36.8 C)  TempSrc: Oral Oral    SpO2: 94% 92% 94% 96%  Weight:      Height:        Intake/Output Summary (Last 24 hours) at 01/03/2019 1257 Last data filed at 01/03/2019 0510 Gross per 24 hour  Intake 540 ml  Output -  Net 540 ml   Physical Exam: A&Ox3, NAD CV: Irregularly irregular Pulmonary: CTA Bilaterally Abdomen: Soft, Nontender, Nondistended Vascular:  Right lower extremity: Knee immobilizer intact clean and dry.  Dressing is clean and dry.  Stump  continues to heal well.  Staples are clean dry and intact.  No signs of infection / necrosis noted.  Dressing reapplied.   Laboratory: CBC    Component Value Date/Time   WBC 11.3 (H) 01/03/2019 0531   HGB 8.2 (L) 01/03/2019 0531   HGB 16.3 01/20/2013 0255   HCT 25.2 (L) 01/03/2019 0531   HCT 47.9 01/20/2013 0255   PLT 489 (H) 01/03/2019 0531   PLT 245 01/20/2013 0255   BMET    Component Value Date/Time   NA 135 01/03/2019 0531   NA 140 01/20/2013 0255   K 3.9 01/03/2019 0531   K 3.5 01/20/2013 0255   CL 105 01/03/2019 0531   CL 103 01/20/2013 0255   CO2 25 01/03/2019 0531   CO2 30 01/20/2013 0255   GLUCOSE 98 01/03/2019 0531   GLUCOSE 145 (H) 01/20/2013 0255   BUN 20 01/03/2019 0531   BUN 22 (H) 01/20/2013 0255   CREATININE 1.15 01/03/2019 0531   CREATININE 1.09 01/20/2013 0255   CALCIUM 7.8 (L) 01/03/2019 0531   CALCIUM 9.4 01/20/2013 0255   GFRNONAA 59 (L) 01/03/2019 0531   GFRNONAA >60 01/20/2013 0255   GFRAA >60 01/03/2019 0531   GFRAA >60 01/20/2013 0255   Assessment/Planning: The patient is an 84 year old male with multiple medical issues including peripheral artery disease status post a right lower extremity endovascular intervention on December 18, 2018 and postop day#57from a right below the knee amputation 1) patient more alert today 2) patient is tolerating the knee immobilizer hopefully this will prevent further contracture 3) stump continues to be healing well.  Discussed with Dr. Ellis Parents Emani Morad PA-C 01/03/2019 12:57 PM

## 2019-01-03 NOTE — Progress Notes (Signed)
OT Cancellation Note  Patient Details Name: [name] MRN: [id number] DOB: 1935/04/28   Cancelled Treatment:    Reason Eval/Treat Not Completed: Patient declined, no reason specified  Pt declined participation in OT session and was argumentative with all attempts at OT intervention. Pt reports 10/10 pain in RLE and declined any further therapy today. Re attempt at a later time/date as the schedule allows.  Chrys Racer, OTR/L, NTMTC ascom [phone] 01/03/19, 2:33 PM

## 2019-01-03 NOTE — Care Management Important Message (Signed)
Important Message  Patient Details  Name: [name] MRN: [id number] Date of Birth: [birth year]   Medicare Important Message Given:  Yes     Su Hilt, RN 01/03/2019, 1:02 PM

## 2019-01-03 NOTE — Progress Notes (Signed)
Nutrition Follow-up  DOCUMENTATION CODES:   Not applicable  INTERVENTION:  Once diet advanced resume Ensure Max Protein po BID, each supplement provides 150 kcal and 30 grams of protein.  Continue daily MVI.  Continue vitamin C 250 mg po BID.  NUTRITION DIAGNOSIS:   Increased nutrient needs related to wound healing as evidenced by estimated needs.  Ongoing.  GOAL:   Patient will meet greater than or equal to 90% of their needs  Not met on clear liquids.  MONITOR:   PO intake, Supplement acceptance, Labs, Weight trends, Skin, I & O's  REASON FOR ASSESSMENT:   LOS    ASSESSMENT:   84 y.o. male with a known history of atrial fibrillation on Xarelto, CAD, ischemic cardiomyopathy with EF of 35 to 40%, hypertension, peripheral vascular disease status post left BKA 2018 and L BKA 8/5 admitted with CHF   -Patient underwent EGD on 8/11 which found small hiatal hernia and gastritis. -Plan is for colonoscopy tomorrow.  Patient on contact precautions. Spoke with him over the phone. He is very upset about being on clear liquids for the past two days. He wants solid food. Discussed the plan would be for him to remain on clear liquids until diet can be advanced by MD following colonoscopy pending findings. He reports he had been eating well with solids prior to being on liquids. Per chart he had been eating 95-100%. He was also drinking Ensure Max Protein (now being held as they are not clear).   Medications reviewed and include: amiodarone, ferrous sulfate 325 mg daily, gabapentin, Novolog 0-15 units Q4hrs, Lantus 12 units QHS, MVI daily, Miralax, vitamin C 250 mg BID.  Labs reviewed: CBG 79-165.  Diet Order:   Diet Order            Diet NPO time specified  Diet effective midnight        Diet clear liquid Room service appropriate? Yes; Fluid consistency: Thin  Diet effective now             EDUCATION NEEDS:   Not appropriate for education at this time  Skin:  Skin  Assessment: Reviewed RN Assessment(Stage II buttocks, incision L leg s/p BKA)  Last BM:  12/31/2018 per chart  Height:   Ht Readings from Last 1 Encounters:  12/20/18 _0  (1.702 m)   Weight:   Wt Readings from Last 1 Encounters:  12/20/18 79.9 kg   Ideal Body Weight:  59 kg(adjusted for BL BKA)  BMI:  Body mass index is 27.59 kg/m.  Estimated Nutritional Needs:   Kcal:  1700-1900kcal/day  Protein:  85-95g/day  Fluid:  >1.7L/day  Willey Blade, MS, RD, LDN Office: [phone] Pager: [phone] After Hours/Weekend Pager: [phone]

## 2019-01-04 ENCOUNTER — Encounter: Payer: Self-pay | Admitting: Anesthesiology

## 2019-01-04 ENCOUNTER — Encounter
Admission: EM | Disposition: A | Discharge status: Skilled Nursing Facility | Payer: Self-pay | Source: Home / Self Care | Attending: Internal Medicine

## 2019-01-04 LAB — CBC
HCT: 26.8 % — ABNORMAL LOW (ref 39.0–52.0)
Hemoglobin: 8.5 g/dL — ABNORMAL LOW (ref 13.0–17.0)
MCH: 28.5 pg (ref 26.0–34.0)
MCHC: 31.7 g/dL (ref 30.0–36.0)
MCV: 89.9 fL (ref 80.0–100.0)
Platelets: 558 10*3/uL — ABNORMAL HIGH (ref 150–400)
RBC: 2.98 MIL/uL — ABNORMAL LOW (ref 4.22–5.81)
RDW: 14.3 % (ref 11.5–15.5)
WBC: 12.3 10*3/uL — ABNORMAL HIGH (ref 4.0–10.5)
nRBC: 0 % (ref 0.0–0.2)

## 2019-01-04 LAB — GLUCOSE, CAPILLARY
Glucose-Capillary: 103 mg/dL — ABNORMAL HIGH (ref 70–99)
Glucose-Capillary: 112 mg/dL — ABNORMAL HIGH (ref 70–99)
Glucose-Capillary: 124 mg/dL — ABNORMAL HIGH (ref 70–99)
Glucose-Capillary: 140 mg/dL — ABNORMAL HIGH (ref 70–99)
Glucose-Capillary: 144 mg/dL — ABNORMAL HIGH (ref 70–99)
Glucose-Capillary: 84 mg/dL (ref 70–99)

## 2019-01-04 SURGERY — COLONOSCOPY WITH PROPOFOL
Anesthesia: General

## 2019-01-04 MED ORDER — PEG 3350-KCL-NA BICARB-NACL 420 G PO SOLR
4000.0000 mL | Freq: Once | ORAL | Status: AC
  Administered 2019-01-04: 4000 mL via ORAL
  Filled 2019-01-04: qty 4000

## 2019-01-04 NOTE — Progress Notes (Signed)
MD alerted, bowel prep for procedure unsuccessful. No new orders at this time, will continue to monitor.

## 2019-01-04 NOTE — Progress Notes (Signed)
[facility] [place] NAME: Kypton Eltringham    MR#:  [id number]  DATE OF BIRTH:  1934/06/23  SUBJECTIVE:  CHIEF COMPLAINT:   Chief Complaint  Patient presents with  . Shortness of Breath   Patient got colonoscopy preparation but still has no bowel movement. REVIEW OF SYSTEMS:  Review of Systems  Constitutional: Positive for malaise/fatigue. Negative for chills and fever.  HENT: Negative for hearing loss and tinnitus.   Eyes: Negative for blurred vision and double vision.  Respiratory: Negative for cough and shortness of breath.   Cardiovascular: Negative for chest pain and palpitations.  Gastrointestinal: Negative for abdominal pain, heartburn, nausea and vomiting.  Genitourinary: Negative for dysuria and urgency.  Musculoskeletal: Negative for neck pain.  Skin: Negative for itching and rash.  Neurological: Negative for dizziness and headaches.  Psychiatric/Behavioral: Negative for depression and hallucinations.     DRUG ALLERGIES:   Allergies  Allergen Reactions  . Contrast Media [Iodinated Diagnostic Agents] Shortness Of Breath    SOB  . Iohexol Shortness Of Breath     Desc: Respiratory Distress, Laryngedema, diaphoresis, Onset Date: [id number]   . Metrizamide Shortness Of Breath    SOB SOB SOB   . Latex Rash   VITALS:  Blood pressure (!) 142/60, pulse 61, temperature 98.2 F (36.8 C), temperature source Oral, resp. rate 18, height 5\' 7"  (1.702 m), weight 79.9 kg, SpO2 96 %. PHYSICAL EXAMINATION:  Physical Exam  GENERAL:84 y.o.-year-old patient lying in the bed, feels miserable secondary to right foot pain EYES: Pupils equal, round, reactive to light and accommodation. No scleral icterus. Extraocular muscles intact.  HEENT: Head atraumatic, normocephalic. Oropharynx and nasopharynx clear.  NECK: Supple, no jugular venous distention. No thyroid enlargement, no tenderness.  LUNGS: Normal breath sounds  bilaterally,bibasilar crackles,no wheezing, rhonchi or crepitation. No use of accessory muscles of respiration.  CARDIOVASCULAR: S1, S2 normal. No rubs, or gallops. 3/6 systolic murmur present ABDOMEN: Soft, nontender, nondistended. Bowel sounds present. No organomegaly or mass.  EXTREMITIES:s/p left BKA previously and now status post recent right BKA on 12/27/2018.  Stump in dressing. NEUROLOGIC: Awake and alert and oriented.  No focal deficit.   PSYCHIATRIC: Patient awake and alert and oriented. SKIN: No obvious rash, lesion.  LABORATORY PANEL:  Male CBC Recent Labs  Lab 01/04/19 0414  WBC 12.3*  HGB 8.5*  HCT 26.8*  PLT 558*   ------------------------------------------------------------------------------------------------------------------ Chemistries  Recent Labs  Lab 01/03/19 0531  NA 135  K 3.9  CL 105  CO2 25  GLUCOSE 98  BUN 20  CREATININE 1.15  CALCIUM 7.8*  MG 2.1   RADIOLOGY:  No results found. ASSESSMENT AND PLAN:   [name] a84 y.o.malewith a known history of atrial fibrillation on Xarelto, CAD, ischemic cardiomyopathy with EF of 35 to 40%, hypertension, peripheral vascular disease status post left BKA and right leg angioplasty 2 days prior to this admission who presented to hospital secondary to confusion and worsening shortness of breath   1. Acute hypoxic respiratory failure-secondary to acute pulmonary edema. -Acute on chronic systolic heart failure. Last cardiac cath was in January 2020 showing chronic occlusions and collaterals from LAD. Medical management recommended. EF was around 35%. -Patient was admitted on high flow nasal cannula, much improved with IV diuresis. -Renal function slowly improving  Wean off oxygen as tolerated.   Off Lasix due to recent acute kidney injury.  2. Sepsis-present on admission.  Secondary to Diabetic right heel ulcer-appears necrotic, ischemic ulcer. Status  post recent revascularization and  arterial stents placement on 12/18/2018. -Appreciate ID, podiatry and vascular consult. -Appreciate podiatry, ID and vascular consult. .  Blood cultures growing Proteus mirabilis.  Wound cultures so far growing Pseudomonas and MRSA.  IV Zosyn previously changed to meropenem.  Patient also on vancomycin. -MRI of the ankle -showing large wound in the posterior aspect of the heel, septic Achilles tendinopathy and possible abscess and pre-Achilles region with osteomyelitis of the calcaneus with cellulitis and mild fasciitis. Patient status post debridement in the OR on 12/23/2018 and wound VAC was applied.  However due to progression of gangrene patient subsequently had right BKA done on 12/27/2018  Leukocytosis improved, discontinued IV Zosyn per Dr. Erline Hau.. Continue local wound care.   3. Diabetes mellitus- A1c is 8.9,  Continue current regimen of Lantus insulin. Continue sliding scale insulin coverage and monitor.  4. A. fib-rate controlled. Continue cardiac medications. Xarelto resumed.  Hold Xarelto due to positive FOBT PE and anemia.  5. ARF onCKD stage III-stable.   6. Acute metabolic encephalopathy from underlying infection and pain medications. Significantly improved.  Anemia of chronic disease and due to acute blood loss.  Hemoglobin decreased to 6.4.  Increased to 9.4. S/P 1 unit PRBC transfusion.  Hemoglobin is stable.  Positive FOTB and anemia. Hold Xarelto.  EGD is unremarkable.  Colonoscopy today.  DVT prophylaxis: SCD. Physical therapy suggest skilled nursing facility placement. Wheelchair-bound with walker transfers at baseline All the records are reviewed and case discussed with Care Management/Social Worker. Management plans discussed with the patient, family and they are in agreement. I called patient's wife and I discussed with his wife about his current condition and treatment plan.  CODE STATUS: Full Code  TOTAL TIME TAKING CARE OF THIS PATIENT: 27  minutes.   More than 50% of the time was spent in counseling/coordination of care: YES  POSSIBLE D/C IN 1-2 DAYS, DEPENDING ON CLINICAL CONDITION.   [name] M.D on 01/04/2019 at 1:56 PM  Between 7am to 6pm - Pager - [phone]  After 6pm go to [email or web address] - Proofreader  [facility]  Office  [phone]  CC: Primary care physician; Corey Skains, MD  Note: This dictation was prepared with Dragon dictation along with smaller phrase technology. Any transcriptional errors that result from this process are unintentional.

## 2019-01-04 NOTE — Progress Notes (Signed)
Physical Therapy Treatment Patient Details Name: [name] MRN: [id number] DOB: Nov 24, 1934 Today's Date: 01/04/2019    History of Present Illness Pt admitted for ARF with complaints of SOB and non healing R heel wound. Pt has had complicated hospital stay and ended up requiring R BKA secondary to gangrene on 12/27/18. PMH includes Afib, CAD, cardiomyopathy, HTN, L BKA.    PT Comments    Pt agreeable to PT; denies pain RLE. Pt performs supine LE exercises; poor R QS, but improving with continued repetition and tactile/verbal cues. Pt pleasantly frustrated with inability to eat/drink anything due to awaiting colonoscopy. Pt also shares concern for being "a cripple" now that he will have to have 2 prosthetic devices. Emotional support and encouragement provided. Continue PT to progress strength and improve R knee extension to allow for prosthetic on R to improve functional mobility post healing.    Follow Up Recommendations  SNF     Equipment Recommendations  None recommended by PT    Recommendations for Other Services       Precautions / Restrictions Restrictions Weight Bearing Restrictions: Yes RLE Weight Bearing: Non weight bearing LLE Weight Bearing: Non weight bearing    Mobility  Bed Mobility                  Transfers                    Ambulation/Gait                 Stairs             Wheelchair Mobility    Modified Rankin (Stroke Patients Only)       Balance                                            Cognition Arousal/Alertness: Awake/alert Behavior During Therapy: WFL for tasks assessed/performed Overall Cognitive Status: Within Functional Limits for tasks assessed                                        Exercises General Exercises - Lower Extremity Quad Sets: Strengthening;Both;15 reps Gluteal Sets: Strengthening;Both;15 reps Short Arc Quad: AROM;AAROM;Right;10 reps(AROM  L) Heel Slides: AROM;Both;10 reps Hip ABduction/ADduction: AROM;Strengthening;Both;10 reps Straight Leg Raises: AROM;Strengthening;Both;10 reps    General Comments        Pertinent Vitals/Pain Pain Assessment: No/denies pain    Home Living                      Prior Function            PT Goals (current goals can now be found in the care plan section) Progress towards PT goals: Progressing toward goals    Frequency    7X/week      PT Plan Current plan remains appropriate    Co-evaluation              AM-PAC PT "6 Clicks" Mobility   Outcome Measure  Help needed turning from your back to your side while in a flat bed without using bedrails?: A Lot Help needed moving from lying on your back to sitting on the side of a flat bed without using bedrails?: Total Help needed moving to and from a bed to  a chair (including a wheelchair)?: Total Help needed standing up from a chair using your arms (e.g., wheelchair or bedside chair)?: Total Help needed to walk in hospital room?: Total Help needed climbing 3-5 steps with a railing? : Total 6 Click Score: 7    End of Session Equipment Utilized During Treatment: Right knee immobilizer Activity Tolerance: Patient tolerated treatment well Patient left: in bed;with call bell/phone within reach;with bed alarm set;Other (comment)(R knee immobilizer in place)   PT Visit Diagnosis: Muscle weakness (generalized) (M62.81);Difficulty in walking, not elsewhere classified (R26.2);Pain Pain - Right/Left: Right Pain - part of body: Leg     Time: 1222-1242 PT Time Calculation (min) (ACUTE ONLY): 20 min  Charges:  $Therapeutic Exercise: 8-22 mins                      Larae Grooms, PTA 01/04/2019, 12:47 PM

## 2019-01-04 NOTE — Anesthesia Preprocedure Evaluation (Deleted)
Anesthesia Evaluation  Patient identified by MRN, date of birth, ID band Patient awake    Reviewed: Allergy & Precautions, NPO status , Patient's Chart, lab work & pertinent test results  History of Anesthesia Complications Negative for: history of anesthetic complications  Airway Mallampati: II  TM Distance: >3 FB Neck ROM: Full    Dental  (+) Upper Dentures   Pulmonary sleep apnea , neg COPD, former smoker,    breath sounds clear to auscultation- rhonchi (-) wheezing      Cardiovascular hypertension, Pt. on medications + Past MI, + Peripheral Vascular Disease and +CHF  (-) Cardiac Stents and (-) CABG  Rhythm:Regular Rate:Normal - Systolic murmurs and - Diastolic murmurs    Neuro/Psych neg Seizures negative neurological ROS  negative psych ROS   GI/Hepatic negative GI ROS, Neg liver ROS,   Endo/Other  diabetes, Insulin Dependent  Renal/GU Renal InsufficiencyRenal disease     Musculoskeletal  (+) Arthritis ,   Abdominal (+) - obese,   Peds  Hematology  (+) Blood dyscrasia, anemia ,   Anesthesia Other Findings Past Medical History: No date: Arthritis No date: Atrial fibrillation (HCC) No date: Carcinoma of prostate (HCC) No date: CHF (congestive heart failure) (HCC) No date: Chronic kidney disease No date: Diabetes mellitus without complication (HCC) No date: ED (erectile dysfunction) No date: Frequent urination No date: Hyperlipidemia No date: Hypertension No date: Moderate mitral insufficiency No date: Peripheral vascular disease ([place]) 04/2016: Pneumonia No date: Prostate cancer ([place]) No date: Sleep apnea     Comment:  OSA--USE C-PAP No date: Ulcer of left foot due to type 2 diabetes mellitus ([place]) No date: Urinary stress incontinence, male   Reproductive/Obstetrics                             Anesthesia Physical  Anesthesia Plan  ASA: III  Anesthesia Plan: General    Post-op Pain Management:    Induction: Intravenous  PONV Risk Score and Plan: 1 and Propofol infusion  Airway Management Planned: Natural Airway and Nasal Cannula  Additional Equipment:   Intra-op Plan:   Post-operative Plan:   Informed Consent: I have reviewed the patients History and Physical, chart, labs and discussed the procedure including the risks, benefits and alternatives for the proposed anesthesia with the patient or authorized representative who has indicated his/her understanding and acceptance.     Dental advisory given  Plan Discussed with: CRNA and Anesthesiologist  Anesthesia Plan Comments:         Anesthesia Quick Evaluation

## 2019-01-04 NOTE — Progress Notes (Signed)
ID Treated proteus bacteremia Rt heel necrotic ulcer with polymicrobial infection resolved with BKA- completed  Iv antibiotics No fever in 4 days-since Dc  antibiotics Wbc much better As per vascular note the rt BKA stump is healing well ID will sign off- call if needed

## 2019-01-04 NOTE — Progress Notes (Signed)
Pt completed bowel prep. Pt now NPO.

## 2019-01-04 NOTE — Progress Notes (Signed)
OT Cancellation Note  Patient Details Name: Dion Sibal MRN: [id number] DOB: 28-May-1934   Cancelled Treatment:    Reason Eval/Treat Not Completed: Other (comment). Upon attempt, pt with RN for care. Will re-attempt at later date/time as pt is available and as schedule permits.   Jeni Salles, MPH, MS, OTR/L ascom [phone] 01/04/19, 3:09 PM

## 2019-01-05 ENCOUNTER — Inpatient Hospital Stay: Payer: Medicare Other | Admitting: Anesthesiology

## 2019-01-05 ENCOUNTER — Encounter
Admission: EM | Disposition: A | Discharge status: Skilled Nursing Facility | Payer: Self-pay | Source: Home / Self Care | Attending: Internal Medicine

## 2019-01-05 ENCOUNTER — Encounter: Payer: Self-pay | Admitting: Anesthesiology

## 2019-01-05 DIAGNOSIS — K635 Polyp of colon: Secondary | ICD-10-CM

## 2019-01-05 DIAGNOSIS — D124 Benign neoplasm of descending colon: Secondary | ICD-10-CM

## 2019-01-05 HISTORY — PX: COLONOSCOPY WITH PROPOFOL: SHX5780

## 2019-01-05 LAB — SARS CORONAVIRUS 2 BY RT PCR (HOSPITAL ORDER, PERFORMED IN ~~LOC~~ HOSPITAL LAB): SARS Coronavirus 2: NEGATIVE

## 2019-01-05 LAB — GLUCOSE, CAPILLARY
Glucose-Capillary: 116 mg/dL — ABNORMAL HIGH (ref 70–99)
Glucose-Capillary: 118 mg/dL — ABNORMAL HIGH (ref 70–99)
Glucose-Capillary: 127 mg/dL — ABNORMAL HIGH (ref 70–99)
Glucose-Capillary: 86 mg/dL (ref 70–99)
Glucose-Capillary: 94 mg/dL (ref 70–99)

## 2019-01-05 LAB — CBC
HCT: 26.3 % — ABNORMAL LOW (ref 39.0–52.0)
Hemoglobin: 8.4 g/dL — ABNORMAL LOW (ref 13.0–17.0)
MCH: 29 pg (ref 26.0–34.0)
MCHC: 31.9 g/dL (ref 30.0–36.0)
MCV: 90.7 fL (ref 80.0–100.0)
Platelets: 520 10*3/uL — ABNORMAL HIGH (ref 150–400)
RBC: 2.9 MIL/uL — ABNORMAL LOW (ref 4.22–5.81)
RDW: 14.1 % (ref 11.5–15.5)
WBC: 11.7 10*3/uL — ABNORMAL HIGH (ref 4.0–10.5)
nRBC: 0 % (ref 0.0–0.2)

## 2019-01-05 SURGERY — COLONOSCOPY WITH PROPOFOL
Anesthesia: General

## 2019-01-05 MED ORDER — PROPOFOL 500 MG/50ML IV EMUL
INTRAVENOUS | Status: DC | PRN
  Administered 2019-01-05: 100 ug/kg/min via INTRAVENOUS

## 2019-01-05 MED ORDER — PROPOFOL 10 MG/ML IV BOLUS
INTRAVENOUS | Status: DC | PRN
  Administered 2019-01-05: 50 mg via INTRAVENOUS

## 2019-01-05 MED ORDER — RIVAROXABAN 15 MG PO TABS: 15.0000 mg | ORAL_TABLET | Freq: Every day | ORAL | 1 refills | Status: DC

## 2019-01-05 MED ORDER — SENNA 8.6 MG PO TABS: 1.0000 | ORAL_TABLET | Freq: Every day | ORAL | 0 refills | Status: DC | PRN

## 2019-01-05 MED ORDER — ENSURE MAX PROTEIN PO LIQD: 11.0000 [oz_av] | Freq: Two times a day (BID) | ORAL | Status: DC

## 2019-01-05 MED ORDER — BISACODYL 5 MG PO TBEC: 5.0000 mg | DELAYED_RELEASE_TABLET | Freq: Every day | ORAL | 0 refills | Status: DC | PRN

## 2019-01-05 MED ORDER — FENTANYL CITRATE (PF) 100 MCG/2ML IJ SOLN
INTRAMUSCULAR | Status: AC
  Filled 2019-01-05: qty 2

## 2019-01-05 MED ORDER — FENTANYL CITRATE (PF) 100 MCG/2ML IJ SOLN
INTRAMUSCULAR | Status: DC | PRN
  Administered 2019-01-05: 25 ug via INTRAVENOUS

## 2019-01-05 MED ORDER — ASCORBIC ACID 250 MG PO TABS: 250.0000 mg | ORAL_TABLET | Freq: Two times a day (BID) | ORAL | Status: DC

## 2019-01-05 MED ORDER — ONDANSETRON HCL 4 MG/2ML IJ SOLN: 4.0000 mg | Freq: Once | INTRAMUSCULAR | Status: DC | PRN

## 2019-01-05 MED ORDER — FENTANYL CITRATE (PF) 100 MCG/2ML IJ SOLN: 25.0000 ug | INTRAMUSCULAR | Status: DC | PRN

## 2019-01-05 MED ORDER — PHENYLEPHRINE HCL (PRESSORS) 10 MG/ML IV SOLN
INTRAVENOUS | Status: DC | PRN
  Administered 2019-01-05: 100 ug via INTRAVENOUS

## 2019-01-05 MED ORDER — SODIUM CHLORIDE 0.9 % IV SOLN
INTRAVENOUS | Status: DC
  Administered 2019-01-05: 13:00:00 via INTRAVENOUS

## 2019-01-05 MED ORDER — ADULT MULTIVITAMIN W/MINERALS CH: 1.0000 | ORAL_TABLET | Freq: Every day | ORAL | Status: DC

## 2019-01-05 NOTE — Progress Notes (Signed)
OT Cancellation Note  Patient Details Name: [name] MRN: [id number] DOB: [birth year]   Cancelled Treatment:    Reason Eval/Treat Not Completed: Patient at procedure or test/ unavailable. OT attempted to see pt for tx on this date. Pt noted to be off the floor for a procedure. Will re-attempt at a later time/date as available and pt medically appropriate for OT tx.  Shara Blazing, M.S., OTR/L Ascom: [phone] 01/05/19, 3:56 PM

## 2019-01-05 NOTE — Op Note (Signed)
[facility] Gastroenterology Patient Name: [name] Procedure Date: 01/05/2019 2:28 PM MRN: [id number] Account #: [id number] Date of Birth: Sep 03, 1934 Admit Type: Outpatient Age: 84 Room: [facility] ENDO ROOM 4 Gender: Male Note Status: Finalized Procedure:            Colonoscopy Indications:          Iron deficiency anemia Providers:            Lucilla Lame MD, MD Medicines:            Propofol per Anesthesia Complications:        No immediate complications. Procedure:            Pre-Anesthesia Assessment:                       - Prior to the procedure, a History and Physical was                        performed, and patient medications and allergies were                        reviewed. The patient's tolerance of previous                        anesthesia was also reviewed. The risks and benefits of                        the procedure and the sedation options and risks were                        discussed with the patient. All questions were                        answered, and informed consent was obtained. Prior                        Anticoagulants: The patient has taken no previous                        anticoagulant or antiplatelet agents. ASA Grade                        Assessment: III - A patient with severe systemic                        disease. After reviewing the risks and benefits, the                        patient was deemed in satisfactory condition to undergo                        the procedure.                       After obtaining informed consent, the colonoscope was                        passed under direct vision. Throughout the procedure,                        the patient's blood pressure, pulse, and  oxygen                        saturations were monitored continuously. The                        Colonoscope was introduced through the anus and                        advanced to the the cecum, identified by appendiceal                         orifice and ileocecal valve. The colonoscopy was                        performed without difficulty. The patient tolerated the                        procedure well. The quality of the bowel preparation                        was fair. Findings:      The perianal and digital rectal examinations were normal.      A 4 mm polyp was found in the hepatic flexure. The polyp was sessile.       The polyp was removed with a cold snare. Resection and retrieval were       complete.      A 3 mm polyp was found in the ascending colon. The polyp was sessile.       The polyp was removed with a cold snare. Resection and retrieval were       complete.      A 5 mm polyp was found in the descending colon. The polyp was sessile.       The polyp was removed with a cold snare. Resection and retrieval were       complete.      Non-bleeding internal hemorrhoids were found during retroflexion. The       hemorrhoids were Grade I (internal hemorrhoids that do not prolapse). Impression:           - Preparation of the colon was fair.                       - One 4 mm polyp at the hepatic flexure, removed with a                        cold snare. Resected and retrieved.                       - One 3 mm polyp in the ascending colon, removed with a                        cold snare. Resected and retrieved.                       - One 5 mm polyp in the descending colon, removed with                        a cold snare. Resected and retrieved.                       -  Non-bleeding internal hemorrhoids. Recommendation:       - Return patient to hospital ward for ongoing care.                       - Resume regular diet.                       - Continue present medications.                       - Await pathology results. Procedure Code(s):    --- Professional ---                       [phone], Colonoscopy, flexible; with removal of tumor(s),                        polyp(s), or other lesion(s) by snare  technique Diagnosis Code(s):    --- Professional ---                       D50.9, Iron deficiency anemia, unspecified                       K63.5, Polyp of colon CPT copyright 2019 [employer]. All rights reserved. The codes documented in this report are preliminary and upon coder review may  be revised to meet current compliance requirements. Lucilla Lame MD, MD 01/05/2019 3:14:52 PM This report has been signed electronically. Number of Addenda: 0 Note Initiated On: 01/05/2019 2:28 PM Scope Withdrawal Time: 0 hours 9 minutes 36 seconds  Total Procedure Duration: 0 hours 24 minutes 39 seconds  Estimated Blood Loss: Estimated blood loss: none.      [employer]

## 2019-01-05 NOTE — TOC Progression Note (Signed)
Transition of Care [facility]) - Progression Note    Patient Details  Name: [name] MRN: [id number] Date of Birth: 05-24-1935  Transition of Care [facility]) CM/SW [place], RN Phone Number: 01/05/2019, 10:14 AM  Clinical Narrative:    Mosetta Putt with [facility] that the patient will be able to DC this afternoon and he will need air bed she assigned room 30 B for him        Expected Discharge Plan and Services           Expected Discharge Date: 01/05/19                                     Social Determinants of Health (SDOH) Interventions    Readmission Risk Interventions Readmission Risk Prevention Plan 12/25/2018 12/23/2018  Transportation Screening Complete Complete  PCP or Specialist Appt within 3-5 Days Complete -  HRI or [place] Complete Complete  Social Work Consult for [place] Planning/Counseling Complete -  Palliative Care Screening Not Applicable -  Medication Review [employer] photographer) Complete Complete  Some recent data might be hidden

## 2019-01-05 NOTE — Progress Notes (Signed)
Bowel prep complete. Patient NPO. Pt has had extra large BM at this time.

## 2019-01-05 NOTE — Transfer of Care (Signed)
Immediate Anesthesia Transfer of Care Note  Patient: [name]  Procedure(s) Performed: COLONOSCOPY WITH PROPOFOL (N/A )  Patient Location: PACU  Anesthesia Type:General  Level of Consciousness: awake, alert  and oriented  Airway & Oxygen Therapy: Patient Spontanous Breathing and Patient connected to nasal cannula oxygen  Post-op Assessment: Report given to RN and Post -op Vital signs reviewed and stable  Post vital signs: Reviewed and stable  Last Vitals:  Vitals Value Taken Time  BP 113/50 01/05/19 1522  Temp 36.1 C 01/05/19 1522  Pulse 68 01/05/19 1522  Resp 19 01/05/19 1522  SpO2 100 % 01/05/19 1522  Vitals shown include unvalidated device data.  Last Pain:  Vitals:   01/05/19 1522  TempSrc:   PainSc: 0-No pain      Patients Stated Pain Goal: 0 (32/[id number])  Complications: No apparent anesthesia complications

## 2019-01-05 NOTE — Discharge Instructions (Signed)
Vascular Surgery Discharge Instructions 1) Change right stump dressing daily.  Gently cover the right below the knee stump with a Kerlix.  Avoid placing any tape on the skin. 2) Keep knee immobilizer on during the day.  Remove knee immobilizer at night to prevent any skin breakdown. 3) Encourage flexibility at the knee joint to prevent contracture.  Hold Xarelto for 3 days.  Start on January 09, 2019.

## 2019-01-05 NOTE — Anesthesia Post-op Follow-up Note (Signed)
Anesthesia QCDR form completed.        

## 2019-01-05 NOTE — Anesthesia Preprocedure Evaluation (Signed)
Anesthesia Evaluation  Patient identified by MRN, date of birth, ID band Patient awake    Reviewed: Allergy & Precautions, NPO status , Patient's Chart, lab work & pertinent test results  History of Anesthesia Complications Negative for: history of anesthetic complications  Airway Mallampati: II  TM Distance: >3 FB Neck ROM: Full    Dental  (+) Upper Dentures   Pulmonary sleep apnea , neg COPD, former smoker,    breath sounds clear to auscultation- rhonchi (-) wheezing      Cardiovascular hypertension, Pt. on medications + Past MI, + Peripheral Vascular Disease and +CHF  (-) Cardiac Stents and (-) CABG  Rhythm:Regular Rate:Normal - Systolic murmurs and - Diastolic murmurs    Neuro/Psych neg Seizures negative neurological ROS  negative psych ROS   GI/Hepatic negative GI ROS, Neg liver ROS,   Endo/Other  diabetes, Insulin Dependent  Renal/GU Renal InsufficiencyRenal disease     Musculoskeletal  (+) Arthritis ,   Abdominal (+) - obese,   Peds  Hematology  (+) Blood dyscrasia, anemia ,   Anesthesia Other Findings Past Medical History: No date: Arthritis No date: Atrial fibrillation (HCC) No date: Carcinoma of prostate (HCC) No date: CHF (congestive heart failure) (HCC) No date: Chronic kidney disease No date: Diabetes mellitus without complication (HCC) No date: ED (erectile dysfunction) No date: Frequent urination No date: Hyperlipidemia No date: Hypertension No date: Moderate mitral insufficiency No date: Peripheral vascular disease ([place]ugenio Saenz) 04/2016: Pneumonia No date: Prostate cancer ([place]) No date: Sleep apnea     Comment:  OSA--USE C-PAP No date: Ulcer of left foot due to type 2 diabetes mellitus ([place]) No date: Urinary stress incontinence, male   Reproductive/Obstetrics                             Anesthesia Physical  Anesthesia Plan  ASA: III  Anesthesia Plan: General    Post-op Pain Management:    Induction: Intravenous  PONV Risk Score and Plan: 1 and Propofol infusion  Airway Management Planned: Natural Airway and Nasal Cannula  Additional Equipment:   Intra-op Plan:   Post-operative Plan:   Informed Consent: I have reviewed the patients History and Physical, chart, labs and discussed the procedure including the risks, benefits and alternatives for the proposed anesthesia with the patient or authorized representative who has indicated his/her understanding and acceptance.     Dental advisory given  Plan Discussed with: CRNA and Anesthesiologist  Anesthesia Plan Comments:         Anesthesia Quick Evaluation

## 2019-01-05 NOTE — Anesthesia Postprocedure Evaluation (Signed)
Anesthesia Post Note  Patient: [name]  Procedure(s) Performed: COLONOSCOPY WITH PROPOFOL (N/A )  Patient location during evaluation: Endoscopy Anesthesia Type: General Level of consciousness: awake and alert and oriented Pain management: pain level controlled Vital Signs Assessment: post-procedure vital signs reviewed and stable Respiratory status: spontaneous breathing Cardiovascular status: blood pressure returned to baseline Anesthetic complications: no     Last Vitals:  Vitals:   01/05/19 1530 01/05/19 1540  BP: 128/60 (!) 147/55  Pulse: 66 65  Resp: (!) 22 14  Temp:    SpO2: 100% 100%    Last Pain:  Vitals:   01/05/19 1522  TempSrc:   PainSc: 0-No pain                 Yoselin Amerman

## 2019-01-05 NOTE — Discharge Summary (Signed)
[facility] NAME: [name]    MR#:  [id number]  DATE OF BIRTH:  May 12, 1935  DATE OF ADMISSION:  12/20/2018   ADMITTING PHYSICIAN: [name], MD  DATE OF DISCHARGE: No discharge date for patient encounter.  PRIMARY CARE PHYSICIAN: Corey Skains, MD   ADMISSION DIAGNOSIS:  Acute respiratory failure with hypoxia (HCC) [J96.01] Acute on chronic congestive heart failure, unspecified heart failure type ([facility]) [I50.9] Type 2 diabetes mellitus with hyperglycemia, unspecified whether long term insulin use ([facility]) [E11.65] DISCHARGE DIAGNOSIS:  Active Problems:   Acute respiratory failure (HCC)   Sacral decubitus ulcer, stage II (HCC)   Iron deficiency anemia due to chronic blood loss   Polyp of descending colon  SECONDARY DIAGNOSIS:   Past Medical History:  Diagnosis Date  . Arthritis   . Atrial fibrillation ([place])   . Carcinoma of prostate ([place])   . CHF (congestive heart failure) ([place])   . Chronic kidney disease   . Diabetes mellitus without complication ([place])   . ED (erectile dysfunction)   . Frequent urination   . Hyperlipidemia   . Hypertension   . Moderate mitral insufficiency   . Peripheral vascular disease ([place])   . Pneumonia 04/2016  . Prostate cancer ([place])   . Sleep apnea    OSA--USE C-PAP  . Ulcer of left foot due to type 2 diabetes mellitus ([place])   . Urinary stress incontinence, male    HOSPITAL COURSE:  [name] a84 y.o.malewith a known history of atrial fibrillation on Xarelto, CAD, ischemic cardiomyopathy with EF of 35 to 40%, hypertension, peripheral vascular disease status post left BKA and right leg angioplasty 2 days prior to this admission who presented to hospital secondary to confusion and worsening shortness of breath   1. Acute hypoxic respiratory failure-secondary to acute pulmonary edema. -Acute on chronic systolic heart failure. Last cardiac cath was in January  2020 showing chronic occlusions and collaterals from LAD. Medical management recommended. EF was around 35%. -Patient was admitted on high flow nasal cannula, much improved with IV diuresis. -Renal function slowly improving  Wean off oxygen as tolerated.  Off Lasix due to recent acute kidney injury.  2.Sepsis-present on admission. Secondary to Diabetic right heel ulcer-appears necrotic, ischemic ulcer. Status post recent revascularization and arterial stents placement on 12/18/2018. -Appreciate ID, podiatry and vascular consult. -Appreciate podiatry, ID and vascular consult.. Blood cultures growing Proteus mirabilis.  Wound cultures so far growing Pseudomonas and MRSA.  IV Zosyn previously changed to meropenem.  Patient also on vancomycin. -MRI of the ankle-showing large wound in the posterior aspect of the heel, septic Achilles tendinopathy and possible abscess and pre-Achilles region with osteomyelitis of the calcaneus with cellulitis and mild fasciitis. Patient status post debridement in the OR on 12/23/2018 and wound VAC was applied.  However due to progression of gangrene patient subsequently had right BKA done on 12/27/2018  Leukocytosis improved, discontinued IV Zosyn per Dr. Erline Hau.. Continue local wound care.   3. Diabetes mellitus- A1c is 8.9,  Continue current regimen of Lantus insulin. He is treated with sliding scale insulin coverage.  4. A. fib-rate controlled. Continue cardiac medications. Hold Xarelto due to positive FOBT PE and anemia. Hold Xarelto for 3 days then resume.  5. ARF onCKD stage III-stable.   6. Acute metabolic encephalopathy from underlying infection and pain medications. Significantly improved.  Anemia of chronic disease and due to acute blood loss.  Hemoglobin decreased to 6.4.  Increased to  9.4. S/P 1 unit PRBC transfusion.  Hemoglobin is stable.  Positive FOTB and anemia. Hold Xarelto.  EGD is unremarkable.  Colonoscopy today:  Polyps removed.  Hold Xarelto for 3 days per Dr. Allen Norris. DISCHARGE CONDITIONS:  Stable, discharge to nursing home today. CONSULTS OBTAINED:  Treatment Team:  Samara Deist, DPM Tsosie Billing, MD DRUG ALLERGIES:   Allergies  Allergen Reactions  . Contrast Media [Iodinated Diagnostic Agents] Shortness Of Breath    SOB  . Iohexol Shortness Of Breath     Desc: Respiratory Distress, Laryngedema, diaphoresis, Onset Date: [id number]   . Metrizamide Shortness Of Breath    SOB SOB SOB   . Latex Rash   DISCHARGE MEDICATIONS:   Allergies as of 01/05/2019      Reactions   Contrast Media [iodinated Diagnostic Agents] Shortness Of Breath   SOB   Iohexol Shortness Of Breath    Desc: Respiratory Distress, Laryngedema, diaphoresis, Onset Date: [id number]   Metrizamide Shortness Of Breath   SOB SOB SOB   Latex Rash      Medication List    STOP taking these medications   methocarbamol 500 MG tablet Commonly known as: ROBAXIN   traMADol 50 MG tablet Commonly known as: ULTRAM     TAKE these medications   amiodarone 200 MG tablet Commonly known as: PACERONE Take 200 mg by mouth daily.   ascorbic acid 250 MG tablet Commonly known as: VITAMIN C Take 1 tablet (250 mg total) by mouth 2 (two) times daily.   aspirin EC 81 MG tablet Take 1 tablet (81 mg total) by mouth daily.   atorvastatin 80 MG tablet Commonly known as: LIPITOR Take 80 mg by mouth every evening.   bisacodyl 5 MG EC tablet Commonly known as: DULCOLAX Take 1 tablet (5 mg total) by mouth daily as needed for moderate constipation.   diltiazem 120 MG 24 hr capsule Commonly known as: CARDIZEM CD Take 120 mg by mouth daily.   Ensure Max Protein Liqd Take 330 mLs (11 oz total) by mouth 2 (two) times daily.   ferrous sulfate 325 (65 FE) MG tablet Take 1 tablet (325 mg total) by mouth daily with breakfast.   HYDROcodone-acetaminophen 5-325 MG tablet Commonly known as: NORCO/VICODIN Take 1-2 tablets by  mouth every 6 (six) hours as needed for moderate pain (pain score 4-6).   insulin glargine 100 UNIT/ML injection Commonly known as: LANTUS Inject 0.15 mLs (15 Units total) into the skin at bedtime.   insulin lispro 100 UNIT/ML injection Commonly known as: HumaLOG Inject 0.03 mLs (3 Units total) into the skin 3 (three) times daily with meals. What changed: additional instructions   metoprolol tartrate 25 MG tablet Commonly known as: LOPRESSOR Take 1 tablet (25 mg total) by mouth 3 (three) times daily.   multivitamin with minerals Tabs tablet Take 1 tablet by mouth daily.   nitroGLYCERIN 0.4 MG SL tablet Commonly known as: NITROSTAT Place 1 tablet (0.4 mg total) under the tongue every 5 (five) minutes as needed for chest pain.   Rivaroxaban 15 MG Tabs tablet Commonly known as: XARELTO Take 1 tablet (15 mg total) by mouth daily with supper. Hold Xarelto for 3 days.  Start on January 09, 2019. Start taking on: January 09, 2019 What changed:   additional instructions  These instructions start on January 09, 2019. If you are unsure what to do until then, ask your doctor or other care provider.   senna 8.6 MG Tabs tablet Commonly known as: SENOKOT Take 1 tablet (  8.6 mg total) by mouth daily as needed for mild constipation.        DISCHARGE INSTRUCTIONS:  See AVS. If you experience worsening of your admission symptoms, develop shortness of breath, life threatening emergency, suicidal or homicidal thoughts you must seek medical attention immediately by calling 911 or calling your MD immediately  if symptoms less severe.  You Must read complete instructions/literature along with all the possible adverse reactions/side effects for all the Medicines you take and that have been prescribed to you. Take any new Medicines after you have completely understood and accpet all the possible adverse reactions/side effects.   Please note  You were cared for by a hospitalist during your hospital  stay. If you have any questions about your discharge medications or the care you received while you were in the hospital after you are discharged, you can call the unit and asked to speak with the hospitalist on call if the hospitalist that took care of you is not available. Once you are discharged, your primary care physician will handle any further medical issues. Please note that NO REFILLS for any discharge medications will be authorized once you are discharged, as it is imperative that you return to your primary care physician (or establish a relationship with a primary care physician if you do not have one) for your aftercare needs so that they can reassess your need for medications and monitor your lab values.    On the day of Discharge:  VITAL SIGNS:  Blood pressure (!) 147/55, pulse 65, temperature (!) 97 F (36.1 C), resp. rate 14, height 5\' 7"  (1.702 m), weight 79.9 kg, SpO2 100 %. PHYSICAL EXAMINATION:  GENERAL:  84 y.o.-year-old patient lying in the bed with no acute distress.  EYES: Pupils equal, round, reactive to light and accommodation. No scleral icterus. Extraocular muscles intact.  HEENT: Head atraumatic, normocephalic. Oropharynx and nasopharynx clear.  NECK:  Supple, no jugular venous distention. No thyroid enlargement, no tenderness.  LUNGS: Normal breath sounds bilaterally, no wheezing, rales,rhonchi or crepitation. No use of accessory muscles of respiration.  CARDIOVASCULAR: S1, S2 normal. No murmurs, rubs, or gallops.  ABDOMEN: Soft, non-tender, non-distended. Bowel sounds present. No organomegaly or mass.  EXTREMITIES: No pedal edema, cyanosis, or clubbing.  Bilateral BKA. NEUROLOGIC: Cranial nerves II through XII are intact. Muscle strength 4/5 in all extremities. Sensation intact. Gait not checked.  PSYCHIATRIC: The patient is alert and oriented x 3.  SKIN: No obvious rash, lesion, or ulcer.  DATA REVIEW:   CBC Recent Labs  Lab 01/05/19 0325  WBC 11.7*  HGB  8.4*  HCT 26.3*  PLT 520*    Chemistries  Recent Labs  Lab 01/03/19 0531  NA 135  K 3.9  CL 105  CO2 25  GLUCOSE 98  BUN 20  CREATININE 1.15  CALCIUM 7.8*  MG 2.1     Microbiology Results  Results for orders placed or performed during the hospital encounter of 12/20/18  SARS Coronavirus 2 (CEPHEID- Performed in [facility] hospital lab), Hosp Order     Status: None   Collection Time: 12/20/18  6:02 AM   Specimen: Nasopharyngeal Swab  Result Value Ref Range Status   SARS Coronavirus 2 NEGATIVE NEGATIVE Final    Comment: (NOTE) If result is NEGATIVE SARS-CoV-2 target nucleic acids are NOT DETECTED. The SARS-CoV-2 RNA is generally detectable in upper and lower  respiratory specimens during the acute phase of infection. The lowest  concentration of SARS-CoV-2 viral copies this assay can  detect is 250  copies / mL. A negative result does not preclude SARS-CoV-2 infection  and should not be used as the sole basis for treatment or other  patient management decisions.  A negative result may occur with  improper specimen collection / handling, submission of specimen other  than nasopharyngeal swab, presence of viral mutation(s) within the  areas targeted by this assay, and inadequate number of viral copies  (<250 copies / mL). A negative result must be combined with clinical  observations, patient history, and epidemiological information. If result is POSITIVE SARS-CoV-2 target nucleic acids are DETECTED. The SARS-CoV-2 RNA is generally detectable in upper and lower  respiratory specimens dur ing the acute phase of infection.  Positive  results are indicative of active infection with SARS-CoV-2.  Clinical  correlation with patient history and other diagnostic information is  necessary to determine patient infection status.  Positive results do  not rule out bacterial infection or co-infection with other viruses. If result is PRESUMPTIVE POSTIVE SARS-CoV-2 nucleic acids MAY  BE PRESENT.   A presumptive positive result was obtained on the submitted specimen  and confirmed on repeat testing.  While 2019 novel coronavirus  (SARS-CoV-2) nucleic acids may be present in the submitted sample  additional confirmatory testing may be necessary for epidemiological  and / or clinical management purposes  to differentiate between  SARS-CoV-2 and other Sarbecovirus currently known to infect humans.  If clinically indicated additional testing with an alternate test  methodology [phone]) is advised. The SARS-CoV-2 RNA is generally  detectable in upper and lower respiratory sp ecimens during the acute  phase of infection. The expected result is Negative. Fact Sheet for Patients:  StrictlyIdeas.no Fact Sheet for Healthcare Providers: BankingDealers.co.za This test is not yet approved or cleared by the [place] FDA and has been authorized for detection and/or diagnosis of SARS-CoV-2 by FDA under an Emergency Use Authorization (EUA).  This EUA will remain in effect (meaning this test can be used) for the duration of the COVID-19 declaration under Section 564(b)(1) of the Act, 21 U.S.C. section 360bbb-3(b)(1), unless the authorization is terminated or revoked sooner. Performed at [facility], [address] [place], [place] [address]   Blood culture (routine x 2)     Status: None   Collection Time: 12/20/18  6:02 AM   Specimen: BLOOD  Result Value Ref Range Status   Specimen Description BLOOD LEFT AC  Final   Special Requests   Final    BOTTLES DRAWN AEROBIC AND ANAEROBIC Blood Culture results may not be optimal due to an excessive volume of blood received in culture bottles   Culture   Final    NO GROWTH 5 DAYS Performed at [facility], [address], [place], [place] [address]    Report Status 12/25/2018 FINAL  Final  Blood culture (routine x 2)     Status: None   Collection Time: 12/20/18   6:02 AM   Specimen: BLOOD  Result Value Ref Range Status   Specimen Description BLOOD RIGHT FA  Final   Special Requests   Final    BOTTLES DRAWN AEROBIC AND ANAEROBIC Blood Culture adequate volume   Culture   Final    NO GROWTH 5 DAYS Performed at [facility], [address], [place], [place] [address]    Report Status 12/25/2018 FINAL  Final  MRSA PCR Screening     Status: None   Collection Time: 12/20/18 10:48 AM   Specimen: Nasopharyngeal  Result  Value Ref Range Status   MRSA by PCR NEGATIVE NEGATIVE Final    Comment:        The GeneXpert MRSA Assay (FDA approved for NASAL specimens only), is one component of a comprehensive MRSA colonization surveillance program. It is not intended to diagnose MRSA infection nor to guide or monitor treatment for MRSA infections. Performed at [facility] [address] [place], [place] [address]   CULTURE, BLOOD (ROUTINE X 2) w Reflex to ID Panel     Status: Abnormal   Collection Time: 12/20/18 11:36 AM   Specimen: BLOOD  Result Value Ref Range Status   Specimen Description   Final    BLOOD BLOOD LEFT HAND Performed at [facility], [address] [place], [place] [address]    Special Requests   Final    BOTTLES DRAWN AEROBIC AND ANAEROBIC Blood Culture adequate volume Performed at [facility], [address] [place], [place] [address]    Culture  Setup Time   Final    GRAM NEGATIVE RODS AEROBIC BOTTLE ONLY CRITICAL RESULT CALLED TO, READ BACK BY AND VERIFIED WITH: LISA KLUTTZ AT 2440 12/22/2018 SDR Performed at [facility], [place] [address], [place], [place] [address]    Culture PROTEUS MIRABILIS (A)  Final   Report Status 12/24/2018 FINAL  Final   Organism ID, Bacteria PROTEUS MIRABILIS  Final      Susceptibility   Proteus mirabilis - MIC*    AMPICILLIN <=2 SENSITIVE Sensitive     CEFAZOLIN <=4 SENSITIVE Sensitive     CEFEPIME <=1 SENSITIVE Sensitive     CEFTAZIDIME <=1  SENSITIVE Sensitive     CEFTRIAXONE <=1 SENSITIVE Sensitive     CIPROFLOXACIN >=4 RESISTANT Resistant     GENTAMICIN <=1 SENSITIVE Sensitive     IMIPENEM 4 SENSITIVE Sensitive     TRIMETH/SULFA >=320 RESISTANT Resistant     AMPICILLIN/SULBACTAM <=2 SENSITIVE Sensitive     PIP/TAZO <=4 SENSITIVE Sensitive     * PROTEUS MIRABILIS  Blood Culture ID Panel (Reflexed)     Status: Abnormal   Collection Time: 12/20/18 11:36 AM  Result Value Ref Range Status   Enterococcus species NOT DETECTED NOT DETECTED Final   Listeria monocytogenes NOT DETECTED NOT DETECTED Final   Staphylococcus species NOT DETECTED NOT DETECTED Final   Staphylococcus aureus (BCID) NOT DETECTED NOT DETECTED Final   Streptococcus species NOT DETECTED NOT DETECTED Final   Streptococcus agalactiae NOT DETECTED NOT DETECTED Final   Streptococcus pneumoniae NOT DETECTED NOT DETECTED Final   Streptococcus pyogenes NOT DETECTED NOT DETECTED Final   Acinetobacter baumannii NOT DETECTED NOT DETECTED Final   Enterobacteriaceae species DETECTED (A) NOT DETECTED Final    Comment: Enterobacteriaceae represent a large family of gram-negative bacteria, not a single organism. CRITICAL RESULT CALLED TO, READ BACK BY AND VERIFIED WITH:  LISA KLUTTZ AT 5366 12/22/2018 SDR    Enterobacter cloacae complex NOT DETECTED NOT DETECTED Final   Escherichia coli NOT DETECTED NOT DETECTED Final   Klebsiella oxytoca NOT DETECTED NOT DETECTED Final   Klebsiella pneumoniae NOT DETECTED NOT DETECTED Final   Proteus species DETECTED (A) NOT DETECTED Final    Comment: CRITICAL RESULT CALLED TO, READ BACK BY AND VERIFIED WITH:  LISA KLUTTZ AT 1014 12/22/2018 SDR    Serratia marcescens NOT DETECTED NOT DETECTED Final   Carbapenem resistance NOT DETECTED NOT DETECTED Final   Haemophilus influenzae NOT DETECTED NOT DETECTED Final   Neisseria meningitidis NOT DETECTED NOT DETECTED Final  Pseudomonas aeruginosa NOT DETECTED NOT DETECTED Final   Candida  albicans NOT DETECTED NOT DETECTED Final   Candida glabrata NOT DETECTED NOT DETECTED Final   Candida krusei NOT DETECTED NOT DETECTED Final   Candida parapsilosis NOT DETECTED NOT DETECTED Final   Candida tropicalis NOT DETECTED NOT DETECTED Final    Comment: Performed at [facility], [address] [place], [place] [address]  CULTURE, BLOOD (ROUTINE X 2) w Reflex to ID Panel     Status: None   Collection Time: 12/20/18 11:37 AM   Specimen: BLOOD  Result Value Ref Range Status   Specimen Description BLOOD BLOOD RIGHT HAND  Final   Special Requests   Final    BOTTLES DRAWN AEROBIC AND ANAEROBIC Blood Culture adequate volume   Culture   Final    NO GROWTH 5 DAYS Performed at [facility], [address], [place], [place] [address]    Report Status 12/25/2018 FINAL  Final  Aerobic/Anaerobic Culture (surgical/deep wound)     Status: None   Collection Time: 12/20/18 11:50 AM   Specimen: Wound  Result Value Ref Range Status   Specimen Description   Final    WOUND Performed at [facility], [address] [place], [place] [address]    Special Requests   Final    Normal Performed at [place] [place], [place] [address]    Gram Stain   Final    FEW WBC PRESENT, PREDOMINANTLY PMN RARE SQUAMOUS EPITHELIAL CELLS PRESENT ABUNDANT GRAM POSITIVE RODS MODERATE GRAM POSITIVE COCCI MODERATE GRAM NEGATIVE RODS    Culture   Final    MODERATE PSEUDOMONAS AERUGINOSA FEW METHICILLIN RESISTANT STAPHYLOCOCCUS AUREUS NO ANAEROBES ISOLATED Performed at [facility], [place] [address], [place], [place] [address]    Report Status 12/25/2018 FINAL  Final   Organism ID, Bacteria PSEUDOMONAS AERUGINOSA  Final   Organism ID, Bacteria METHICILLIN RESISTANT STAPHYLOCOCCUS AUREUS  Final      Susceptibility   Methicillin resistant staphylococcus aureus - MIC*    CIPROFLOXACIN >=8 RESISTANT Resistant     ERYTHROMYCIN >=8 RESISTANT  Resistant     GENTAMICIN <=0.5 SENSITIVE Sensitive     OXACILLIN >=4 RESISTANT Resistant     TETRACYCLINE >=16 RESISTANT Resistant     VANCOMYCIN <=0.5 SENSITIVE Sensitive     TRIMETH/SULFA >=320 RESISTANT Resistant     CLINDAMYCIN >=8 RESISTANT Resistant     RIFAMPIN <=0.5 SENSITIVE Sensitive     Inducible Clindamycin NEGATIVE Sensitive     * FEW METHICILLIN RESISTANT STAPHYLOCOCCUS AUREUS   Pseudomonas aeruginosa - MIC*    CEFTAZIDIME 4 SENSITIVE Sensitive     CIPROFLOXACIN <=0.25 SENSITIVE Sensitive     GENTAMICIN <=1 SENSITIVE Sensitive     IMIPENEM 1 SENSITIVE Sensitive     PIP/TAZO <=4 SENSITIVE Sensitive     CEFEPIME <=1 SENSITIVE Sensitive     * MODERATE PSEUDOMONAS AERUGINOSA  Aerobic/Anaerobic Culture (surgical/deep wound)     Status: None   Collection Time: 12/23/18 10:41 AM   Specimen: [facility] Other; Tissue  Result Value Ref Range Status   Specimen Description   Final    HEEL Performed at [facility], [address] [place], [place] [address]    Special Requests RIGHT ABSCESS  Final   Gram Stain NO WBC SEEN NO ORGANISMS SEEN   Final   Culture   Final    FEW METHICILLIN RESISTANT STAPHYLOCOCCUS AUREUS NO ANAEROBES ISOLATED CRITICAL RESULT CALLED TO, READ BACK  BY AND VERIFIED WITH: AJuline Patch, RN AT 4259 ON 12/28/18 BY C. JESSUP, MLT CONCERNING GRAM STAIN AND CULTURE RESULTS. Performed at [facility], [place] [address] [place], [place] [address]    Report Status 12/28/2018 FINAL  Final   Organism ID, Bacteria METHICILLIN RESISTANT STAPHYLOCOCCUS AUREUS  Final      Susceptibility   Methicillin resistant staphylococcus aureus - MIC*    CIPROFLOXACIN >=8 RESISTANT Resistant     ERYTHROMYCIN >=8 RESISTANT Resistant     GENTAMICIN <=0.5 SENSITIVE Sensitive     OXACILLIN >=4 RESISTANT Resistant     TETRACYCLINE >=16 RESISTANT Resistant     VANCOMYCIN <=0.5 SENSITIVE Sensitive     TRIMETH/SULFA >=320 RESISTANT Resistant     CLINDAMYCIN >=8 RESISTANT  Resistant     RIFAMPIN <=0.5 SENSITIVE Sensitive     Inducible Clindamycin NEGATIVE Sensitive     * FEW METHICILLIN RESISTANT STAPHYLOCOCCUS AUREUS  Aerobic/Anaerobic Culture (surgical/deep wound)     Status: None   Collection Time: 12/23/18 10:44 AM   Specimen: [facility] Bone biopsy; Tissue  Result Value Ref Range Status   Specimen Description   Final    BIOPSY Performed at [facility], [address] [place], [place] [address]    Special Requests   Final    NONE Performed at [facility], [place],  [address]    Gram Stain   Final    RARE WBC PRESENT,BOTH PMN AND MONONUCLEAR NO ORGANISMS SEEN    Culture   Final    FEW PROTEUS MIRABILIS RARE ESCHERICHIA COLI Confirmed Extended Spectrum Beta-Lactamase Producer (ESBL).  In bloodstream infections from ESBL organisms, carbapenems are preferred over piperacillin/tazobactam. They are shown to have a lower risk of mortality. FEW PSEUDOMONAS AERUGINOSA NO ANAEROBES ISOLATED CRITICAL RESULT CALLED TO, READ BACK BY AND VERIFIED WITH: AJuline Patch, RN AT [facility] ON 12/28/18 BY C. JESSUP, MLT CONCERNING GRAM STAIN AND CULTURE RESULTS. Performed at [facility], [place] [address] [place],  [address]    Report Status 12/28/2018 FINAL  Final   Organism ID, Bacteria PROTEUS MIRABILIS  Final   Organism ID, Bacteria ESCHERICHIA COLI  Final   Organism ID, Bacteria PSEUDOMONAS AERUGINOSA  Final      Susceptibility   Escherichia coli - MIC*    AMPICILLIN >=32 RESISTANT Resistant     CEFAZOLIN >=64 RESISTANT Resistant     CEFEPIME 16 INTERMEDIATE Intermediate     CEFTAZIDIME 16 INTERMEDIATE Intermediate     CEFTRIAXONE >=64 RESISTANT Resistant     CIPROFLOXACIN >=4 RESISTANT Resistant     GENTAMICIN <=1 SENSITIVE Sensitive     IMIPENEM <=0.25 SENSITIVE Sensitive     TRIMETH/SULFA >=320 RESISTANT Resistant     AMPICILLIN/SULBACTAM >=32 RESISTANT Resistant     PIP/TAZO 8 SENSITIVE Sensitive      Extended ESBL POSITIVE Resistant     * RARE ESCHERICHIA COLI   Pseudomonas aeruginosa - MIC*    CEFTAZIDIME 4 SENSITIVE Sensitive     CIPROFLOXACIN <=0.25 SENSITIVE Sensitive     GENTAMICIN <=1 SENSITIVE Sensitive     IMIPENEM 2 SENSITIVE Sensitive     PIP/TAZO 8 SENSITIVE Sensitive     CEFEPIME <=1 SENSITIVE Sensitive     * FEW PSEUDOMONAS AERUGINOSA   Proteus mirabilis - MIC*    AMPICILLIN <=2 SENSITIVE Sensitive     CEFAZOLIN <=4 SENSITIVE Sensitive     CEFEPIME <=1 SENSITIVE Sensitive     CEFTAZIDIME <=1 SENSITIVE Sensitive  CEFTRIAXONE <=1 SENSITIVE Sensitive     CIPROFLOXACIN >=4 RESISTANT Resistant     GENTAMICIN <=1 SENSITIVE Sensitive     IMIPENEM 2 SENSITIVE Sensitive     TRIMETH/SULFA 160 RESISTANT Resistant     AMPICILLIN/SULBACTAM <=2 SENSITIVE Sensitive     PIP/TAZO <=4 SENSITIVE Sensitive     * FEW PROTEUS MIRABILIS    RADIOLOGY:  No results found.   Management plans discussed with the patient, family and they are in agreement.  CODE STATUS: Full Code   TOTAL TIME TAKING CARE OF THIS PATIENT: 33 minutes.    [name] M.D on 01/05/2019 at 3:56 PM  Between 7am to 6pm - Pager - [phone]  After 6pm go to [email or web address] - Proofreader  [facility]  Office  [phone]  CC: Primary care physician; Corey Skains, MD   Note: This dictation was prepared with Dragon dictation along with smaller phrase technology. Any transcriptional errors that result from this process are unintentional.

## 2019-01-08 ENCOUNTER — Encounter: Payer: Self-pay | Admitting: Gastroenterology

## 2019-01-09 ENCOUNTER — Encounter (INDEPENDENT_AMBULATORY_CARE_PROVIDER_SITE_OTHER): Payer: Self-pay

## 2019-01-09 ENCOUNTER — Encounter (INDEPENDENT_AMBULATORY_CARE_PROVIDER_SITE_OTHER): Payer: Medicare Other

## 2019-01-09 ENCOUNTER — Ambulatory Visit (INDEPENDENT_AMBULATORY_CARE_PROVIDER_SITE_OTHER): Payer: Medicare Other | Admitting: Nurse Practitioner

## 2019-01-09 LAB — SURGICAL PATHOLOGY

## 2019-01-10 NOTE — Progress Notes (Signed)
Patient ID: [name], male    DOB: 10-18-1934, 84 y.o.   MRN: [id number]  HPI  [name] is a 84 y/o male with a history of atrial fibrillation, prostate cancer, CKD, DM, hyperlipidemia, HTN, PVD, obstructive sleep apnea (CPAP), gangrene, previous tobacco use and chronic heart failure.  Echo report from 06/03/2018 reviewed and showed an EF of 45-50% Reviewed echo report from 11/24/17 which showed an EF of 40% along with mild Jesse and a PA pressure of 58 mm Hg. Reviewed echo report done 05/19/16 which showed an EF of 60-65% along with moderate Jesse.   Admitted 12/20/2018 due to acute hypoxic respiratory failure-secondary to acute pulmonary edema. Cardiology, GI, podiatry and ID consults obtained. Initially needed IV lasix and then transitioned to oral diuretics. Sepsis-present on admission secondary to diabetic right heel ulcer-appears necrotic, ischemic ulcer. MRI of the ankle-showing large wound in the posterior aspect of the heel. 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 Discharged after 16 days.   He presents today for a follow-up visit with a chief complaint of moderate fatigue upon minimal exertion. He describes this as chronic in nature having been present for several years. He has associated weakness, easy bruising and depression along with this. He denies any dizziness, abdominal distention, palpitations, leg edema, chest pain or shortness of breath. Says that he gets weighed in the bed at the facility but he doesn't know if it's daily or not. Does have prosthesis for the left leg.   Past Medical History:  Diagnosis Date  . Arthritis   . Atrial fibrillation ([place])   . Carcinoma of prostate ([place])   . CHF (congestive heart failure) ([place])   . Chronic kidney disease   . Diabetes mellitus without complication ([place])   . ED (erectile dysfunction)   . Frequent urination   . Hyperlipidemia   . Hypertension   . Moderate mitral insufficiency    . Peripheral vascular disease ([place])   . Pneumonia 04/2016  . Prostate cancer ([place])   . Sleep apnea    OSA--USE C-PAP  . Ulcer of left foot due to type 2 diabetes mellitus ([place])   . Urinary stress incontinence, male    Past Surgical History:  Procedure Laterality Date  . AMPUTATION Left 01/12/2017   Procedure: AMPUTATION BELOW KNEE;  Surgeon: Algernon Huxley, MD;  Location: [facility];  Service: General;  Laterality: Left;  . AMPUTATION Right 12/27/2018   Procedure: AMPUTATION BELOW KNEE;  Surgeon: Algernon Huxley, MD;  Location: [facility];  Service: General;  Laterality: Right;  . APLIGRAFT PLACEMENT Left 10/15/2016   Procedure: APLIGRAFT PLACEMENT;  Surgeon: Albertine Patricia, DPM;  Location: [facility];  Service: Podiatry;  Laterality: Left;  necrotic ulcer  . CHOLECYSTECTOMY    . COLONOSCOPY WITH PROPOFOL N/A 01/02/2019   Procedure: COLONOSCOPY WITH PROPOFOL;  Surgeon: Lucilla Lame, MD;  Location: [facility] ENDOSCOPY;  Service: Endoscopy;  Laterality: N/A;  . COLONOSCOPY WITH PROPOFOL N/A 01/05/2019   Procedure: COLONOSCOPY WITH PROPOFOL;  Surgeon: Lucilla Lame, MD;  Location: [facility] ENDOSCOPY;  Service: Endoscopy;  Laterality: N/A;  . ESOPHAGOGASTRODUODENOSCOPY (EGD) WITH PROPOFOL N/A 01/02/2019   Procedure: ESOPHAGOGASTRODUODENOSCOPY (EGD) WITH PROPOFOL;  Surgeon: Lucilla Lame, MD;  Location: [facility] ENDOSCOPY;  Service: Endoscopy;  Laterality: N/A;  . EYE SURGERY Bilateral    Cataract Extraction with IOL  . HIP ARTHROPLASTY Right 10/13/2018   Procedure: ARTHROPLASTY BIPOLAR HIP (HEMIARTHROPLASTY);  Surgeon: Earnestine Leys, MD;  Location: [facility];  Service:  Orthopedics;  Laterality: Right;  . IRRIGATION AND DEBRIDEMENT FOOT Left 10/15/2016   Procedure: IRRIGATION AND DEBRIDEMENT FOOT-EXCISIONAL DEBRIDEMENT OF SKIN 3RD, 4TH AND 5TH TOES WITH APPLICATION OF APLIGRAFT;  Surgeon: Albertine Patricia, DPM;  Location: [facility];  Service: Podiatry;  Laterality: Left;  necrotic,gangrene  . IRRIGATION AND DEBRIDEMENT FOOT  Left 12/09/2016   Procedure: IRRIGATION AND DEBRIDEMENT FOOT-MUSCLE/FASCIA, RESECTION OF FOURTH AND FIFTH METATARSAL NECROTIC BONE;  Surgeon: Albertine Patricia, DPM;  Location: [facility];  Service: Podiatry;  Laterality: Left;  . IRRIGATION AND DEBRIDEMENT FOOT Right 12/23/2018   Procedure: IRRIGATION AND DEBRIDEMENT FOOT and wound vac application;  Surgeon: Samara Deist, DPM;  Location: [facility];  Service: Podiatry;  Laterality: Right;  . LEFT HEART CATH AND CORONARY ANGIOGRAPHY N/A 06/05/2018   Procedure: LEFT HEART CATH AND CORONARY ANGIOGRAPHY;  Surgeon: Yolonda Kida, MD;  Location: [place] [facility];  Service: Cardiovascular;  Laterality: N/A;  . LOWER EXTREMITY ANGIOGRAPHY Left 08/16/2016   Procedure: Lower Extremity Angiography;  Surgeon: Algernon Huxley, MD;  Location: [place] [facility];  Service: Cardiovascular;  Laterality: Left;  . LOWER EXTREMITY ANGIOGRAPHY Left 09/01/2016   Procedure: Lower Extremity Angiography;  Surgeon: Algernon Huxley, MD;  Location: [place] [facility];  Service: Cardiovascular;  Laterality: Left;  . LOWER EXTREMITY ANGIOGRAPHY Left 10/14/2016   Procedure: Lower Extremity Angiography;  Surgeon: Algernon Huxley, MD;  Location: [place] [facility];  Service: Cardiovascular;  Laterality: Left;  . LOWER EXTREMITY ANGIOGRAPHY Left 12/20/2016   Procedure: Lower Extremity Angiography;  Surgeon: Algernon Huxley, MD;  Location: [place] [facility];  Service: Cardiovascular;  Laterality: Left;  . LOWER EXTREMITY ANGIOGRAPHY Right 12/18/2018   Procedure: LOWER EXTREMITY ANGIOGRAPHY;  Surgeon: Algernon Huxley, MD;  Location: [place] [facility];  Service: Cardiovascular;  Laterality: Right;  . LOWER EXTREMITY INTERVENTION  09/01/2016   Procedure: Lower Extremity Intervention;  Surgeon: Algernon Huxley, MD;  Location: [name] [facility];  Service: Cardiovascular;;  . PROSTATE SURGERY     removal  . TONSILLECTOMY     as a child   Family History  Problem Relation Age of Onset  .  Hypertension Mother   . Diabetes Mother   . Prostate cancer Neg Hx   . Bladder Cancer Neg Hx   . Kidney disease Neg Hx    Social History   Tobacco Use  . Smoking status: Former Smoker    Packs/day: 0.25    Types: Cigarettes    Quit date: 10/14/1994    Years since quitting: 24.2  . Smokeless tobacco: Never Used  Substance Use Topics  . Alcohol use: No    Alcohol/week: 0.0 standard drinks   Allergies  Allergen Reactions  . Contrast Media [Iodinated Diagnostic Agents] Shortness Of Breath    SOB  . Iohexol Shortness Of Breath     Desc: Respiratory Distress, Laryngedema, diaphoresis, Onset Date: 16010932   . Metrizamide Shortness Of Breath    SOB SOB SOB   . Latex Rash   Prior to Admission medications   Medication Sig Start Date End Date Taking? Authorizing Provider  acetaminophen (TYLENOL) 325 MG tablet Take 650 mg by mouth every 6 (six) hours as needed.   Yes [provider]  amiodarone (PACERONE) 200 MG tablet Take 200 mg by mouth daily. 09/13/18  Yes [provider]  aspirin EC 81 MG tablet Take 1 tablet (81 mg total) by mouth daily. 12/18/18  Yes Dew, Erskine Squibb, MD  atorvastatin (LIPITOR)  80 MG tablet Take 80 mg by mouth every evening. 07/31/18  Yes [provider]  bisacodyl (DULCOLAX) 5 MG EC tablet Take 1 tablet (5 mg total) by mouth daily as needed for moderate constipation. 01/05/19  Yes [name], MD  collagenase (SANTYL) ointment Apply 1 application topically daily.   Yes [provider]  diltiazem (CARDIZEM CD) 120 MG 24 hr capsule Take 120 mg by mouth daily. 09/28/18  Yes [provider]  ferrous sulfate 325 (65 FE) MG tablet Take 1 tablet (325 mg total) by mouth daily with breakfast. 10/18/18  Yes [name] Mandes, MD  HYDROcodone-acetaminophen (NORCO/VICODIN) 5-325 MG tablet Take 1-2 tablets by mouth every 6 (six) hours as needed for moderate pain (pain score 4-6). 10/18/18  Yes [name] Mandes, MD  insulin glargine (LANTUS) 100 UNIT/ML  injection Inject 0.15 mLs (15 Units total) into the skin at bedtime. 10/18/18  Yes [name] Mandes, MD  insulin lispro (HUMALOG) 100 UNIT/ML injection Inject 0.03 mLs (3 Units total) into the skin 3 (three) times daily with meals. Patient taking differently: Inject 3 Units into the skin 3 (three) times daily with meals. Plus 1 additional unit for every 50 BG points over 200 (max 5 additional units per dose) 11/28/17  Yes Wieting, Richard, MD  metoprolol tartrate (LOPRESSOR) 25 MG tablet Take 1 tablet (25 mg total) by mouth 3 (three) times daily. 11/28/17  Yes Wieting, Richard, MD  Multiple Vitamin (MULTIVITAMIN WITH MINERALS) TABS tablet Take 1 tablet by mouth daily. 01/05/19  Yes [name], MD  nitroGLYCERIN (NITROSTAT) 0.4 MG SL tablet Place 1 tablet (0.4 mg total) under the tongue every 5 (five) minutes as needed for chest pain. 06/09/18  Yes Hillary Bow, MD  Rivaroxaban (XARELTO) 15 MG TABS tablet Take 1 tablet (15 mg total) by mouth daily with supper. Hold Xarelto for 3 days.  Start on January 09, 2019. 01/09/19  Yes [name], MD  senna (SENOKOT) 8.6 MG TABS tablet Take 1 tablet (8.6 mg total) by mouth daily as needed for mild constipation. 01/05/19  Yes [name], MD  vitamin C (VITAMIN C) 250 MG tablet Take 1 tablet (250 mg total) by mouth 2 (two) times daily. 01/05/19  Yes [name], MD  Ensure Max Protein (ENSURE MAX PROTEIN) LIQD Take 330 mLs (11 oz total) by mouth 2 (two) times daily. Patient not taking: Reported on 01/11/2019 01/05/19   [name], MD    Review of Systems  Constitutional: Positive for fatigue. Negative for appetite change.  HENT: Positive for hearing loss. Negative for congestion and sore throat.   Eyes: Negative.   Respiratory: Negative for chest tightness and shortness of breath.   Cardiovascular: Negative for chest pain, palpitations and leg swelling.  Gastrointestinal: Negative for abdominal distention and abdominal pain.  Endocrine: Negative.   Genitourinary: Negative.    Musculoskeletal: Positive for arthralgias (both hips).  Skin: Positive for wound (right BKA).  Allergic/Immunologic: Negative.   Neurological: Positive for weakness. Negative for dizziness and light-headedness.  Hematological: Negative for adenopathy. Bruises/bleeds easily.  Psychiatric/Behavioral: Positive for dysphoric mood. Negative for sleep disturbance. The patient is not nervous/anxious.    Vitals:   01/11/19 1326  BP: (!) 145/64  Pulse: 63  Resp: 18  SpO2: 99%  Weight: 180 lb (81.6 kg)   Wt Readings from Last 3 Encounters:  01/11/19 180 lb (81.6 kg)  12/20/18 176 lb 2.4 oz (79.9 kg)  12/18/18 182 lb (82.6 kg)   Lab Results  Component Value Date  CREATININE 1.15 01/03/2019   CREATININE 1.10 12/31/2018   CREATININE 1.18 12/30/2018    Physical Exam  Constitutional: He is oriented to person, place, and time. He appears well-developed and well-nourished.  HENT:  Head: Normocephalic and atraumatic.  Neck: Normal range of motion. Neck supple. No JVD present.  Cardiovascular: Normal rate. An irregular rhythm present.  Pulmonary/Chest: Effort normal. He has no wheezes. He has no rales.  Abdominal: Soft. He exhibits no distension. There is no abdominal tenderness.  Musculoskeletal:        General: Deformity (bilateral BKA) present. No tenderness or edema.  Neurological: He is alert and oriented to person, place, and time.  Skin: Skin is warm and dry.  Psychiatric: He has a normal mood and affect. His behavior is normal. Thought content normal.  Nursing note and vitals reviewed.   Assessment & Plan:  1: Chronic heart failure with preserved ejection fraction- - NYHA class III - euvolemic - can not weigh in clinic due to bilateral leg amputee - order written for him to be weighed in the bed daily and for the facility to call for an overnight weight gain of >2 pounds or a weekly weight gain of >5 pounds - not adding salt to his food; currently at a facility and is unsure  if they cook with salt - saw cardiologist Nehemiah Massed) 07/11/2018 - BNP 12/21/2018 was 1436.0  2: HTN- - BP mildly elevated today; continue to monitor - seeing PCP at the facility right now - saw PCP Raechel Ache) 07/11/2018 - BMP done 01/03/2019 reviewed and shows a sodium 135, potassium 3.9, creatinine 1.15 and GFR of 59  3: Diabetes- - s/p right BKA - A1c done 12/20/2018 was 8.9% - saw endocrinologist Eddie Dibbles) 04/04/18  Facility medication list was reviewed.  Will not make a return appointment for patient at this time. He can call back at anytime to make another appointment.

## 2019-01-11 ENCOUNTER — Encounter: Payer: Self-pay | Admitting: Family

## 2019-01-11 ENCOUNTER — Ambulatory Visit: Payer: Medicare Other | Attending: Family | Admitting: Family

## 2019-01-11 ENCOUNTER — Other Ambulatory Visit: Payer: Self-pay

## 2019-01-11 VITALS — BP 145/64 | HR 63 | Resp 18 | Wt 180.0 lb

## 2019-01-11 DIAGNOSIS — Z79899 Other long term (current) drug therapy: Secondary | ICD-10-CM | POA: Diagnosis not present

## 2019-01-11 DIAGNOSIS — N189 Chronic kidney disease, unspecified: Secondary | ICD-10-CM | POA: Diagnosis not present

## 2019-01-11 DIAGNOSIS — Z794 Long term (current) use of insulin: Secondary | ICD-10-CM | POA: Insufficient documentation

## 2019-01-11 DIAGNOSIS — Z89511 Acquired absence of right leg below knee: Secondary | ICD-10-CM | POA: Diagnosis not present

## 2019-01-11 DIAGNOSIS — E1151 Type 2 diabetes mellitus with diabetic peripheral angiopathy without gangrene: Secondary | ICD-10-CM | POA: Insufficient documentation

## 2019-01-11 DIAGNOSIS — Z888 Allergy status to other drugs, medicaments and biological substances status: Secondary | ICD-10-CM | POA: Insufficient documentation

## 2019-01-11 DIAGNOSIS — Z87891 Personal history of nicotine dependence: Secondary | ICD-10-CM | POA: Diagnosis not present

## 2019-01-11 DIAGNOSIS — Z8249 Family history of ischemic heart disease and other diseases of the circulatory system: Secondary | ICD-10-CM | POA: Diagnosis not present

## 2019-01-11 DIAGNOSIS — I10 Essential (primary) hypertension: Secondary | ICD-10-CM

## 2019-01-11 DIAGNOSIS — Z89512 Acquired absence of left leg below knee: Secondary | ICD-10-CM | POA: Diagnosis not present

## 2019-01-11 DIAGNOSIS — Z91040 Latex allergy status: Secondary | ICD-10-CM | POA: Diagnosis not present

## 2019-01-11 DIAGNOSIS — G4733 Obstructive sleep apnea (adult) (pediatric): Secondary | ICD-10-CM | POA: Diagnosis not present

## 2019-01-11 DIAGNOSIS — E1122 Type 2 diabetes mellitus with diabetic chronic kidney disease: Secondary | ICD-10-CM | POA: Diagnosis not present

## 2019-01-11 DIAGNOSIS — E785 Hyperlipidemia, unspecified: Secondary | ICD-10-CM | POA: Insufficient documentation

## 2019-01-11 DIAGNOSIS — Z9049 Acquired absence of other specified parts of digestive tract: Secondary | ICD-10-CM | POA: Diagnosis not present

## 2019-01-11 DIAGNOSIS — Z91041 Radiographic dye allergy status: Secondary | ICD-10-CM | POA: Insufficient documentation

## 2019-01-11 DIAGNOSIS — I130 Hypertensive heart and chronic kidney disease with heart failure and stage 1 through stage 4 chronic kidney disease, or unspecified chronic kidney disease: Secondary | ICD-10-CM | POA: Insufficient documentation

## 2019-01-11 DIAGNOSIS — I5032 Chronic diastolic (congestive) heart failure: Secondary | ICD-10-CM | POA: Diagnosis not present

## 2019-01-11 DIAGNOSIS — E11621 Type 2 diabetes mellitus with foot ulcer: Secondary | ICD-10-CM | POA: Insufficient documentation

## 2019-01-11 DIAGNOSIS — I4891 Unspecified atrial fibrillation: Secondary | ICD-10-CM | POA: Insufficient documentation

## 2019-01-11 DIAGNOSIS — Z7982 Long term (current) use of aspirin: Secondary | ICD-10-CM | POA: Insufficient documentation

## 2019-01-11 DIAGNOSIS — Z8546 Personal history of malignant neoplasm of prostate: Secondary | ICD-10-CM | POA: Diagnosis not present

## 2019-01-11 DIAGNOSIS — Z7901 Long term (current) use of anticoagulants: Secondary | ICD-10-CM | POA: Insufficient documentation

## 2019-01-11 DIAGNOSIS — Z833 Family history of diabetes mellitus: Secondary | ICD-10-CM | POA: Insufficient documentation

## 2019-01-11 DIAGNOSIS — M1990 Unspecified osteoarthritis, unspecified site: Secondary | ICD-10-CM | POA: Insufficient documentation

## 2019-01-11 DIAGNOSIS — E119 Type 2 diabetes mellitus without complications: Secondary | ICD-10-CM

## 2019-01-12 ENCOUNTER — Telehealth (INDEPENDENT_AMBULATORY_CARE_PROVIDER_SITE_OTHER): Payer: Self-pay

## 2019-01-12 NOTE — Telephone Encounter (Signed)
Demetria from [facility] called stating the patient had right BKA on 12/27/2018 and physical therapy had notice the amputation site had appear different from Tuesday. The amputation site has neurotic tissue around staple area with deep tissue injury 5cm above suture line that appears dehis and very neurotic.She is requesting to see if the patient can be seen sooner than schedule date 01/19/19 and I spoke with Dr Lucky Cowboy and he advise for the patient appointment to be moved up.The patient will be coming on 01/15/2019 and Nat Math has been given information.

## 2019-01-15 ENCOUNTER — Other Ambulatory Visit: Payer: Self-pay

## 2019-01-15 ENCOUNTER — Ambulatory Visit (INDEPENDENT_AMBULATORY_CARE_PROVIDER_SITE_OTHER): Payer: Medicare Other | Admitting: Nurse Practitioner

## 2019-01-15 ENCOUNTER — Telehealth (INDEPENDENT_AMBULATORY_CARE_PROVIDER_SITE_OTHER): Payer: Self-pay

## 2019-01-15 ENCOUNTER — Encounter (INDEPENDENT_AMBULATORY_CARE_PROVIDER_SITE_OTHER): Payer: Self-pay | Admitting: Nurse Practitioner

## 2019-01-15 VITALS — BP 165/68 | HR 48 | Resp 12 | Ht 67.0 in | Wt 165.0 lb

## 2019-01-15 DIAGNOSIS — Z794 Long term (current) use of insulin: Secondary | ICD-10-CM

## 2019-01-15 DIAGNOSIS — I10 Essential (primary) hypertension: Secondary | ICD-10-CM

## 2019-01-15 DIAGNOSIS — S88111A Complete traumatic amputation at level between knee and ankle, right lower leg, initial encounter: Secondary | ICD-10-CM

## 2019-01-15 DIAGNOSIS — E119 Type 2 diabetes mellitus without complications: Secondary | ICD-10-CM

## 2019-01-15 NOTE — Progress Notes (Signed)
SUBJECTIVE:  Patient ID: [name], male    DOB: 04/09/1935, 84 y.o.   MRN: [id number] Chief Complaint  Patient presents with  . Follow-up    HPI  [name] is a 84 y.o. male that presents today for evaluation of his right below-knee amputation.  The patient has had a left below-knee amputation for several years.  There is no area of dehiscence however the middle portion is covered with a numerous amount of eschar.  There is also eschar in the medial surface of the wound.  The patient still has staples present.  The wound is not quite oozing however it is not fully dry.  No visible open areas of dehiscence.  The patient is mostly concerned about the shape of wound.  The patient is also unable to fully straighten his knee at this time.  In the hospital he had a knee immobilizer however at this time he has been working with physical therapy at his rehab facility.  He denies any fever, chills, nausea, vomiting or diarrhea.  Past Medical History:  Diagnosis Date  . Arthritis   . Atrial fibrillation ([place])   . Carcinoma of prostate ([place])   . CHF (congestive heart failure) ([place])   . Chronic kidney disease   . Diabetes mellitus without complication ([place])   . ED (erectile dysfunction)   . Frequent urination   . Hyperlipidemia   . Hypertension   . Moderate mitral insufficiency   . Peripheral vascular disease ([place])   . Pneumonia 04/2016  . Prostate cancer ([place])   . Sleep apnea    OSA--USE C-PAP  . Ulcer of left foot due to type 2 diabetes mellitus ([place])   . Urinary stress incontinence, male     Past Surgical History:  Procedure Laterality Date  . AMPUTATION Left 01/12/2017   Procedure: AMPUTATION BELOW KNEE;  Surgeon: Algernon Huxley, MD;  Location: [facility] ORS;  Service: General;  Laterality: Left;  . AMPUTATION Right 12/27/2018   Procedure: AMPUTATION BELOW KNEE;  Surgeon: Algernon Huxley, MD;  Location: [facility];  Service: General;  Laterality: Right;  . APLIGRAFT  PLACEMENT Left 10/15/2016   Procedure: APLIGRAFT PLACEMENT;  Surgeon: Albertine Patricia, DPM;  Location: [facility];  Service: Podiatry;  Laterality: Left;  necrotic ulcer  . CHOLECYSTECTOMY    . COLONOSCOPY WITH PROPOFOL N/A 01/02/2019   Procedure: COLONOSCOPY WITH PROPOFOL;  Surgeon: Lucilla Lame, MD;  Location: [facility] ENDOSCOPY;  Service: Endoscopy;  Laterality: N/A;  . COLONOSCOPY WITH PROPOFOL N/A 01/05/2019   Procedure: COLONOSCOPY WITH PROPOFOL;  Surgeon: Lucilla Lame, MD;  Location: [facility] ENDOSCOPY;  Service: Endoscopy;  Laterality: N/A;  . ESOPHAGOGASTRODUODENOSCOPY (EGD) WITH PROPOFOL N/A 01/02/2019   Procedure: ESOPHAGOGASTRODUODENOSCOPY (EGD) WITH PROPOFOL;  Surgeon: Lucilla Lame, MD;  Location: [facility] ENDOSCOPY;  Service: Endoscopy;  Laterality: N/A;  . EYE SURGERY Bilateral    Cataract Extraction with IOL  . HIP ARTHROPLASTY Right 10/13/2018   Procedure: ARTHROPLASTY BIPOLAR HIP (HEMIARTHROPLASTY);  Surgeon: Earnestine Leys, MD;  Location: [facility] ORS;  Service: Orthopedics;  Laterality: Right;  . IRRIGATION AND DEBRIDEMENT FOOT Left 10/15/2016   Procedure: IRRIGATION AND DEBRIDEMENT FOOT-EXCISIONAL DEBRIDEMENT OF SKIN 3RD, 4TH AND 5TH TOES WITH APPLICATION OF APLIGRAFT;  Surgeon: Albertine Patricia, DPM;  Location: [facility]S;  Service: Podiatry;  Laterality: Left;  necrotic,gangrene  . IRRIGATION AND DEBRIDEMENT FOOT Left 12/09/2016   Procedure: IRRIGATION AND DEBRIDEMENT FOOT-MUSCLE/FASCIA, RESECTION OF FOURTH AND FIFTH METATARSAL NECROTIC BONE;  Surgeon: Albertine Patricia, DPM;  Location: [facility];  Service: Podiatry;  Laterality: Left;  . IRRIGATION AND DEBRIDEMENT FOOT Right 12/23/2018   Procedure: IRRIGATION AND DEBRIDEMENT FOOT and wound vac application;  Surgeon: Samara Deist, DPM;  Location: [facility] OR[facility];  Service: Podiatry;  Laterality: Right;  . LEFT HEART CATH AND CORONARY ANGIOGRAPHY N/A 06/05/2018   Procedure: LEFT HEART CATH AND CORONARY ANGIOGRAPHY;  Surgeon: Yolonda Kida, MD;  Location:  [place] [facility] LAB;  Service: Cardiovascular;  Laterality: N/A;  . LOWER EXTREMITY ANGIOGRAPHY Left 08/16/2016   Procedure: Lower Extremity Angiography;  Surgeon: Algernon Huxley, MD;  Location: [place] [facility];  Service: Cardiovascular;  Laterality: Left;  . LOWER EXTREMITY ANGIOGRAPHY Left 09/01/2016   Procedure: Lower Extremity Angiography;  Surgeon: Algernon Huxley, MD;  Location: [place] [facility];  Service: Cardiovascular;  Laterality: Left;  . LOWER EXTREMITY ANGIOGRAPHY Left 10/14/2016   Procedure: Lower Extremity Angiography;  Surgeon: Algernon Huxley, MD;  Location: [place] [facility] LAB;  Service: Cardiovascular;  Laterality: Left;  . LOWER EXTREMITY ANGIOGRAPHY Left 12/20/2016   Procedure: Lower Extremity Angiography;  Surgeon: Algernon Huxley, MD;  Location: [place] [facility];  Service: Cardiovascular;  Laterality: Left;  . LOWER EXTREMITY ANGIOGRAPHY Right 12/18/2018   Procedure: LOWER EXTREMITY ANGIOGRAPHY;  Surgeon: Algernon Huxley, MD;  Location: [place] [facility];  Service: Cardiovascular;  Laterality: Right;  . LOWER EXTREMITY INTERVENTION  09/01/2016   Procedure: Lower Extremity Intervention;  Surgeon: Algernon Huxley, MD;  Location: [place] [facility];  Service: Cardiovascular;;  . PROSTATE SURGERY     removal  . TONSILLECTOMY     as a child    Social History   Socioeconomic History  . Marital status: Married    Spouse name: Not on file  . Number of children: Not on file  . Years of education: Not on file  . Highest education level: Not on file  Occupational History  . Occupation: retired  Scientific laboratory technician  . Financial resource strain: Not on file  . Food insecurity    Worry: Not on file    Inability: Not on file  . Transportation needs    Medical: Not on file    Non-medical: Not on file  Tobacco Use  . Smoking status: Former Smoker    Packs/day: 0.25    Types: Cigarettes    Quit date: 10/14/1994    Years since quitting: 24.2  . Smokeless tobacco: Never Used   Substance and Sexual Activity  . Alcohol use: No    Alcohol/week: 0.0 standard drinks  . Drug use: No  . Sexual activity: Not on file  Lifestyle  . Physical activity    Days per week: Not on file    Minutes per session: Not on file  . Stress: Not on file  Relationships  . Social Herbalist on phone: Not on file    Gets together: Not on file    Attends religious service: Not on file    Active member of club or organization: Not on file    Attends meetings of clubs or organizations: Not on file    Relationship status: Not on file  . Intimate partner violence    Fear of current or ex partner: Not on file    Emotionally abused: Not on file    Physically abused: Not on file    Forced sexual activity: Not on file  Other Topics Concern  . Not on file  Social History Narrative   Lives at  home with wife, has a wheelchair    Family History  Problem Relation Age of Onset  . Hypertension Mother   . Diabetes Mother   . Prostate cancer Neg Hx   . Bladder Cancer Neg Hx   . Kidney disease Neg Hx     Allergies  Allergen Reactions  . Contrast Media [Iodinated Diagnostic Agents] Shortness Of Breath    SOB  . Iohexol Shortness Of Breath     Desc: Respiratory Distress, Laryngedema, diaphoresis, Onset Date: [id number]   . Metrizamide Shortness Of Breath    SOB SOB SOB   . Latex Rash     Review of Systems   Review of Systems: Negative Unless Checked Constitutional: [] Weight loss  [] Fever  [] Chills Cardiac: [] Chest pain   []  Atrial Fibrillation  [] Palpitations   [] Shortness of breath when laying flat   [] Shortness of breath with exertion. [] Shortness of breath at rest Vascular:  [] Pain in legs with walking   [] Pain in legs with standing [] Pain in legs when laying flat   [] Claudication    [] Pain in feet when laying flat    [] History of DVT   [] Phlebitis   [] Swelling in legs   [] Varicose veins   [] Non-healing ulcers Pulmonary:   [] Uses home oxygen   [] Productive cough    [] Hemoptysis   [] Wheeze  [] COPD   [] Asthma Neurologic:  [] Dizziness   [] Seizures  [] Blackouts [] History of stroke   [] History of TIA  [] Aphasia   [] Temporary Blindness   [] Weakness or numbness in arm   [] Weakness or numbness in leg Musculoskeletal:   [] Joint swelling   [] Joint pain   [] Low back pain  []  History of Knee Replacement [] Arthritis [] back Surgeries  []  Spinal Stenosis    Hematologic:  [] Easy bruising  [] Easy bleeding   [] Hypercoagulable state   [] Anemic Gastrointestinal:  [] Diarrhea   [] Vomiting  [] Gastroesophageal reflux/heartburn   [] Difficulty swallowing. [] Abdominal pain Genitourinary:  [] Chronic kidney disease   [] Difficult urination  [] Anuric   [] Blood in urine [] Frequent urination  [] Burning with urination   [] Hematuria Skin:  [] Rashes   [] Ulcers [] Wounds Psychological:  [] History of anxiety   []  History of major depression  []  Memory Difficulties      OBJECTIVE:   Physical Exam  BP (!) 165/68 (BP Location: Left Arm, Patient Position: Sitting, Cuff Size: Normal)   Pulse (!) 48   Resp 12   Ht 5\' 7"  (1.702 m)   Wt 165 lb (74.8 kg)   BMI 25.84 kg/m   Gen: WD/WN, NAD Head: [place]/AT, No temporalis wasting.  Ear/Nose/Throat: Hearing grossly intact, nares w/o erythema or drainage Eyes: PER, EOMI, sclera nonicteric.  Neck: Supple, no masses.  No JVD.  Pulmonary:  Good air movement, no use of accessory muscles.  Cardiac: RRR Vascular:  Right knee amputation with eschar mainly in the medial portion however all along the wound. Vessel Right Left  Radial Palpable Palpable   Gastrointestinal: soft, non-distended. No guarding/no peritoneal signs.  Musculoskeletal: Bilateral below-knee amputations.  No deformity or atrophy.  Neurologic: Pain and light touch intact in extremities.  Symmetrical.  Speech is fluent. Motor exam as listed above. Psychiatric: Judgment intact, Mood & affect appropriate for pt's clinical situation. Dermatologic: No Venous rashes. No Ulcers Noted.  No  changes consistent with cellulitis. Lymph : No Cervical lymphadenopathy, no lichenification or skin changes of chronic lymphedema.       ASSESSMENT AND PLAN:  1. Below-knee amputation of right lower extremity ([place]) The patient has not had his  amputation for a full 3 weeks yet so we will have him return in 2 weeks to allow the wound to dry up some more and possibly see if it will heal a little bit more prior to removing the staples.  It was also stressed to both the patient and son that he works diligently with physical therapy in order to straighten his leg for possible prosthesis wearing.  Because of the patient develops contracture he will not be able to utilize a prosthesis on that leg.    2. Type 2 diabetes mellitus treated with insulin ([place]) Continue hypoglycemic medications as already ordered, these medications have been reviewed and there are no changes at this time.  Hgb A1C to be monitored as already arranged by primary service   3. Essential hypertension Continue antihypertensive medications as already ordered, these medications have been reviewed and there are no changes at this time.    Current Outpatient Medications on File Prior to Visit  Medication Sig Dispense Refill  . acetaminophen (TYLENOL) 325 MG tablet Take 650 mg by mouth every 6 (six) hours as needed.    Marland Kitchen amiodarone (PACERONE) 200 MG tablet Take 200 mg by mouth daily.    Marland Kitchen aspirin EC 81 MG tablet Take 1 tablet (81 mg total) by mouth daily. 150 tablet 2  . atorvastatin (LIPITOR) 80 MG tablet Take 80 mg by mouth every evening.    . bisacodyl (DULCOLAX) 5 MG EC tablet Take 1 tablet (5 mg total) by mouth daily as needed for moderate constipation. 30 tablet 0  . collagenase (SANTYL) ointment Apply 1 application topically daily.    Marland Kitchen diltiazem (CARDIZEM CD) 120 MG 24 hr capsule Take 120 mg by mouth daily.    . ferrous sulfate 325 (65 FE) MG tablet Take 1 tablet (325 mg total) by mouth daily with breakfast. 30 tablet 3   . HYDROcodone-acetaminophen (NORCO/VICODIN) 5-325 MG tablet Take 1-2 tablets by mouth every 6 (six) hours as needed for moderate pain (pain score 4-6). 30 tablet 0  . insulin glargine (LANTUS) 100 UNIT/ML injection Inject 0.15 mLs (15 Units total) into the skin at bedtime. 1 vial 0  . metoprolol tartrate (LOPRESSOR) 25 MG tablet Take 1 tablet (25 mg total) by mouth 3 (three) times daily. 90 tablet 0  . Multiple Vitamin (MULTIVITAMIN WITH MINERALS) TABS tablet Take 1 tablet by mouth daily.    . nitroGLYCERIN (NITROSTAT) 0.4 MG SL tablet Place 1 tablet (0.4 mg total) under the tongue every 5 (five) minutes as needed for chest pain. 20 tablet 0  . Rivaroxaban (XARELTO) 15 MG TABS tablet Take 1 tablet (15 mg total) by mouth daily with supper. Hold Xarelto for 3 days.  Start on January 09, 2019. 30 tablet 1  . senna (SENOKOT) 8.6 MG TABS tablet Take 1 tablet (8.6 mg total) by mouth daily as needed for mild constipation. 120 tablet 0  . Ensure Max Protein (ENSURE MAX PROTEIN) LIQD Take 330 mLs (11 oz total) by mouth 2 (two) times daily. (Patient not taking: Reported on 01/11/2019)    . insulin lispro (HUMALOG) 100 UNIT/ML injection Inject 0.03 mLs (3 Units total) into the skin 3 (three) times daily with meals. (Patient taking differently: Inject 3 Units into the skin 3 (three) times daily with meals. Plus 1 additional unit for every 50 BG points over 200 (max 5 additional units per dose)) 10 mL 0  . vitamin C (VITAMIN C) 250 MG tablet Take 1 tablet (250 mg total) by mouth  2 (two) times daily.     Current Facility-Administered Medications on File Prior to Visit  Medication Dose Route Frequency Provider Last Rate Last Dose  . diphenhydrAMINE (BENADRYL) injection 50 mg  50 mg Intravenous Once PRN Stegmayer, Kimberly A, PA-C      . famotidine (PEPCID) tablet 40 mg  40 mg Oral Once PRN Stegmayer, Janalyn Harder, PA-C        There are no Patient Instructions on file for this visit. No follow-ups on file.    Kris Hartmann, NP  This note was completed with Sales executive.  Any errors are purely unintentional.

## 2019-01-15 NOTE — Telephone Encounter (Signed)
Demetria from [place] called to see when the patient next schedule appointment will be and I inform her that he is schedule to come in 01/30/2019

## 2019-01-16 ENCOUNTER — Encounter: Payer: Self-pay | Admitting: Gastroenterology

## 2019-01-16 ENCOUNTER — Ambulatory Visit (INDEPENDENT_AMBULATORY_CARE_PROVIDER_SITE_OTHER): Payer: Medicare Other | Admitting: Gastroenterology

## 2019-01-16 ENCOUNTER — Telehealth: Payer: Self-pay | Admitting: Family

## 2019-01-16 VITALS — BP 158/74 | HR 47 | Temp 98.5°F | Ht 67.0 in

## 2019-01-16 DIAGNOSIS — D500 Iron deficiency anemia secondary to blood loss (chronic): Secondary | ICD-10-CM

## 2019-01-16 NOTE — Telephone Encounter (Signed)
Returned message left regarding weight gain. Patient had gained 2.2 pounds overnight on 8/23 and last night (8/24), he gained an additional 1.5 pounds based on this morning's weight.   Nurse says that patient denies any shortness of breath and doesn't notice any tightness in his abdomen or upper legs (patient is bilateral BKA).   Faxed an order for furosemide 40mg  PO daily for 2 days along with potassium 57meq PO daily for 2 days.

## 2019-01-16 NOTE — Progress Notes (Signed)
Primary Care Physician: Corey Skains, MD  Primary Gastroenterologist:  Dr. Lucilla Lame  Chief Complaint  Patient presents with  . Follow up ER    HPI: [name] is a 84 y.o. male here for follow-up after being discharged from the hospital.  The patient had an EGD and colonoscopy due to anemia.  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  The patient's upper endoscopy showed gastritis and a hiatal hernia.  The patient's blood count was stable on discharge. The patient denies any abdominal pain fevers chills nausea or vomiting.  He seems upset because they have not taken the staples out of his right leg since his amputation and they stated that it would be a few more weeks.  The patient denies any black stools or bloody stools or any other sign of bleeding.  Current Outpatient Medications  Medication Sig Dispense Refill  . acetaminophen (TYLENOL) 325 MG tablet Take 650 mg by mouth every 6 (six) hours as needed.    Marland Kitchen amiodarone (PACERONE) 200 MG tablet Take 200 mg by mouth daily.    Marland Kitchen aspirin EC 81 MG tablet Take 1 tablet (81 mg total) by mouth daily. 150 tablet 2  . atorvastatin (LIPITOR) 80 MG tablet Take 80 mg by mouth every evening.    . bisacodyl (DULCOLAX) 5 MG EC tablet Take 1 tablet (5 mg total) by mouth daily as needed for moderate constipation. 30 tablet 0  . diltiazem (CARDIZEM CD) 120 MG 24 hr capsule Take 120 mg by mouth daily.    . ferrous sulfate 325 (65 FE) MG tablet Take 1 tablet (325 mg total) by mouth daily with breakfast. 30 tablet 3  . HYDROcodone-acetaminophen (NORCO/VICODIN) 5-325 MG tablet Take 1-2 tablets by mouth every 6 (six) hours as needed for moderate pain (pain score 4-6). 30 tablet 0  . insulin glargine (LANTUS) 100 UNIT/ML injection Inject 0.15 mLs (15 Units total) into the skin at bedtime. 1 vial 0  . insulin lispro (HUMALOG) 100 UNIT/ML injection Inject  0.03 mLs (3 Units total) into the skin 3 (three) times daily with meals. (Patient taking differently: Inject 3 Units into the skin 3 (three) times daily with meals. Plus 1 additional unit for every 50 BG points over 200 (max 5 additional units per dose)) 10 mL 0  . metoprolol tartrate (LOPRESSOR) 25 MG tablet Take 1 tablet (25 mg total) by mouth 3 (three) times daily. 90 tablet 0  . Multiple Vitamin (MULTIVITAMIN WITH MINERALS) TABS tablet Take 1 tablet by mouth daily.    . nitroGLYCERIN (NITROSTAT) 0.4 MG SL tablet Place 1 tablet (0.4 mg total) under the tongue every 5 (five) minutes as needed for chest pain. 20 tablet 0  . Rivaroxaban (XARELTO) 15 MG TABS tablet Take 1 tablet (15 mg total) by mouth daily with supper. Hold Xarelto for 3 days.  Start on January 09, 2019. 30 tablet 1  . senna (SENOKOT) 8.6 MG TABS tablet Take 1 tablet (8.6 mg total) by mouth daily as needed for mild constipation. 120 tablet 0  . vitamin C (VITAMIN C) 250 MG tablet Take 1 tablet (250 mg total) by mouth 2 (two) times daily.    . collagenase (SANTYL) ointment Apply 1 application topically daily.    . Ensure Max Protein (ENSURE MAX PROTEIN) LIQD Take 330 mLs (11 oz total) by mouth 2 (two) times daily. (Patient not taking: Reported  on 01/11/2019)     Current Facility-Administered Medications  Medication Dose Route Frequency Provider Last Rate Last Dose  . diphenhydrAMINE (BENADRYL) injection 50 mg  50 mg Intravenous Once PRN Stegmayer, Janalyn Harder, PA-C      . famotidine (PEPCID) tablet 40 mg  40 mg Oral Once PRN Stegmayer, Kimberly A, PA-C        Allergies as of 01/16/2019 - Review Complete 01/16/2019  Allergen Reaction Noted  . Contrast media [iodinated diagnostic agents] Shortness Of Breath 12/23/2014  . Iohexol Shortness Of Breath 09/17/2006  . Metrizamide Shortness Of Breath 12/23/2014  . Latex Rash 10/14/2014    ROS:  General: Negative for anorexia, weight loss, fever, chills, fatigue, weakness. ENT: Negative  for hoarseness, difficulty swallowing , nasal congestion. CV: Negative for chest pain, angina, palpitations, dyspnea on exertion, peripheral edema.  Respiratory: Negative for dyspnea at rest, dyspnea on exertion, cough, sputum, wheezing.  GI: See history of present illness. GU:  Negative for dysuria, hematuria, urinary incontinence, urinary frequency, nocturnal urination.  Endo: Negative for unusual weight change.    Physical Examination:   BP (!) 158/74   Pulse (!) 47   Temp 98.5 F (36.9 C) (Oral)   Ht 5\' 7"  (1.702 m)   BMI 25.84 kg/m   General: Well-nourished, well-developed in no acute distress.  Extremities: Bilateral BKA's Neuro: Alert and oriented x 3.  Grossly intact. Skin: Warm and dry, no jaundice.   Psych: Alert and cooperative, normal mood and affect.  Labs:    Imaging Studies: Mr Ankle Right Wo Contrast  Result Date: 12/21/2018 CLINICAL DATA:  Heel ulcer. EXAM: MRI OF THE RIGHT ANKLE WITHOUT CONTRAST TECHNIQUE: Multiplanar, multisequence MR imaging of the ankle was performed. No intravenous contrast was administered. COMPARISON:  12/14/2018 radiographs. FINDINGS: TENDONS Peroneal: Intact Posteromedial: Intact Anterior: Intact Achilles: Marked tendinopathy involving the distal Achilles tendon with an overlying open wound. This is likely septic tendinopathy/tendinitis with interstitial tears but no complete rupture. There is also moderate fluid surrounding the Achilles tendon and in the pre Achilles space. Suspect dots of gas suggesting abscess. Plantar Fascia: Intact. LIGAMENTS Lateral: Intact Medial: Intact CARTILAGE Ankle Joint: Mild age related degenerative changes. No findings suspicious for septic arthritis. Subtalar Joints/Sinus Tarsi: Mild degenerative change involving the subtalar joints. No findings suspicious for septic arthritis. There is some edema and fluid in the sinus tarsi. Bones: Low T1 and high T2 signal intensity in the posterior aspect of the calcaneus  consistent with osteomyelitis. Other: Moderate cellulitis and myofasciitis. IMPRESSION: 1. Large open wound involving the posterior aspect of the heel right down to the Achilles tendon. 2. Significant septic Achilles tendinopathy. 3. Pre Achilles complex fluid collection containing gas consistent with an abscess measuring approximately 4.2 x 3.0 cm. 4. Osteomyelitis involving the posterior aspect of the calcaneus. 5. Cellulitis and myofasciitis.  No findings for septic arthritis. Electronically Signed   By: Marijo Sanes M.D.   On: 12/21/2018 12:41   Dg Chest Port 1 View  Result Date: 12/21/2018 CLINICAL DATA:  CHF EXAM: PORTABLE CHEST 1 VIEW COMPARISON:  12/20/2018 FINDINGS: Mild improvement in interstitial coarsening. Stable cardiomegaly distorted by rightward rotation. Volume loss and streaky density at the right base. No effusion or pneumothorax. IMPRESSION: 1. Improved pulmonary edema. 2. Atelectasis at the right base. Electronically Signed   By: Monte Fantasia M.D.   On: 12/21/2018 10:34   Dg Chest Portable 1 View  Result Date: 12/20/2018 CLINICAL DATA:  Shortness of breath. EXAM: PORTABLE CHEST 1 VIEW COMPARISON:  08/13/2018. FINDINGS: Cardiomegaly. Diffuse bilateral pulmonary infiltrates/edema. Small bilateral pleural effusions. No pneumothorax. No acute bony abnormality identified. IMPRESSION: Cardiomegaly with diffuse bilateral pulmonary infiltrates and edema. Small bilateral pleural effusions. CHF could present this fashion. Pneumonitis cannot be excluded. Electronically Signed   By: Marcello Moores  Register   On: 12/20/2018 07:01    Assessment and Plan:   [name] is a 84 y.o. y/o male who comes in after being discharged from the hospital with anemia.  The patient underwent an EGD and colonoscopy with polyps found.  The patient has not had any further blood work done since being discharged from the hospital.  The patient will have a CBC done today and if he continues to have anemia may  be considered for a capsule endoscopy.  The patient has been explained the plan and agrees with it.    Lucilla Lame, MD. Marval Regal   Note: This dictation was prepared with Dragon dictation along with smaller phrase technology. Any transcriptional errors that result from this process are unintentional.

## 2019-01-17 ENCOUNTER — Telehealth: Payer: Self-pay

## 2019-01-17 LAB — CBC WITH DIFFERENTIAL/PLATELET
Basophils Absolute: 0.1 10*3/uL (ref 0.0–0.2)
Basos: 1 %
EOS (ABSOLUTE): 0.4 10*3/uL (ref 0.0–0.4)
Eos: 4 %
Hematocrit: 27 % — ABNORMAL LOW (ref 37.5–51.0)
Hemoglobin: 8.9 g/dL — ABNORMAL LOW (ref 13.0–17.7)
Immature Grans (Abs): 0 10*3/uL (ref 0.0–0.1)
Immature Granulocytes: 0 %
Lymphocytes Absolute: 1.4 10*3/uL (ref 0.7–3.1)
Lymphs: 15 %
MCH: 27.7 pg (ref 26.6–33.0)
MCHC: 33 g/dL (ref 31.5–35.7)
MCV: 84 fL (ref 79–97)
Monocytes Absolute: 0.8 10*3/uL (ref 0.1–0.9)
Monocytes: 9 %
Neutrophils Absolute: 6.4 10*3/uL (ref 1.4–7.0)
Neutrophils: 71 %
Platelets: 499 10*3/uL — ABNORMAL HIGH (ref 150–450)
RBC: 3.21 x10E6/uL — ABNORMAL LOW (ref 4.14–5.80)
RDW: 13.2 % (ref 11.6–15.4)
WBC: 9.1 10*3/uL (ref 3.4–10.8)

## 2019-01-17 NOTE — Telephone Encounter (Signed)
Tried contacting pt regarding recent lab results. Not able to leave a message on voicemail.

## 2019-01-17 NOTE — Telephone Encounter (Signed)
-----   Message from Lucilla Lame, MD sent at 01/17/2019  7:15 AM EDT ----- Let the patient know that his blood count is better than when he left the hospital but not back to normal yet.

## 2019-01-17 NOTE — Telephone Encounter (Signed)
Pt's son returned my call and CBC results given.

## 2019-01-19 ENCOUNTER — Ambulatory Visit (INDEPENDENT_AMBULATORY_CARE_PROVIDER_SITE_OTHER): Payer: Medicare Other | Admitting: Nurse Practitioner

## 2019-01-23 ENCOUNTER — Other Ambulatory Visit (INDEPENDENT_AMBULATORY_CARE_PROVIDER_SITE_OTHER): Payer: Self-pay | Admitting: Nurse Practitioner

## 2019-01-23 ENCOUNTER — Telehealth (INDEPENDENT_AMBULATORY_CARE_PROVIDER_SITE_OTHER): Payer: Self-pay | Admitting: Nurse Practitioner

## 2019-01-23 NOTE — Telephone Encounter (Signed)
Dr. Greer Pickerel [phone] phone) from [facility] calling regarding patient. Stating that patient is in extreme pain involving below knee amputation done on 12/27/18. Stating that patient may have contracture of amputation but that it is hard to tell because patient wont let her properly assess him. Per Dr. Greer Pickerel patient has -10 degrees of extension of the knee and has tightness of the joint.   Spoke with [place]-she advised to call in antibiotic and for patient to take antibiotic and pain medication and that may help Dr. Mamie Nick be able to assess the patient better.   Called Jon Billings with [facility] [phone]) and gave verbal order for Bactrim BID x 5days per Arna Medici. Jon Billings said she would give order to pharmacy and start patient on medication.   Dr. Mamie Nick is aware that we were sending in medication. AS, CMA

## 2019-01-25 ENCOUNTER — Telehealth (INDEPENDENT_AMBULATORY_CARE_PROVIDER_SITE_OTHER): Payer: Self-pay | Admitting: Nurse Practitioner

## 2019-01-25 NOTE — Telephone Encounter (Signed)
Tasha with [facility] [phone]) called asking what kind of assessment patient needed. She was advised by Greer Pickerel that patient needed some kind of assessment.  I called Tasha back and spoke with her and advised that I had spoken directly with Dr. Mamie Nick on 01/23/19 and advised her that we needed patient stump to be assessed for possible contracture. Per Dr. Mamie Nick patient was at -10 degree extension of the knee and had a lot of tightness of the joint and was in extreme pain so she was unable to fully assess him. This being the reason Arna Medici started patient on Bactrim antibiotic. Tasha verbalized understanding and the call ended.  I spoke with Arna Medici who asked for me to call back to the facility and ask how patient stump is looking. She was hoping that it wasn't looking extremely infected. I called back to [facility] and asked to speak with Tasha. She picked up the phone and I told her why I was calling. She then handed the phone off to her nursing director, Denyse Dago. I asked Denyse Dago about patient stump and she said that his leg had been looking fairly good. No redness, no drainage. They have left the remaining sutures in until his apt with our office on 01/30/19. That it never really looked bad. She inquired about the conversation that I had with Dr. Mamie Nick on 01/23/19 and I read to her the note that I typed after that conversation. She advised that she had asked Matt-the director of rehab to assess the patient today. That they had started a regular pain medication regimen for patient because Dr. Mamie Nick was saying patient was in extreme pain but that the assessment of the patient from nurses had not been the same. She advised should we have any further contact with Dr. Mamie Nick regarding this patient that she, Denyse Dago, would like to be contacted as well. She stated that Catalina Antigua would assess patient today for contracture and would notify our office either way. I verbalized understanding and informed Arna Medici of the conversation.

## 2019-01-30 ENCOUNTER — Encounter (INDEPENDENT_AMBULATORY_CARE_PROVIDER_SITE_OTHER): Payer: Self-pay | Admitting: Nurse Practitioner

## 2019-01-30 ENCOUNTER — Other Ambulatory Visit: Payer: Self-pay

## 2019-01-30 ENCOUNTER — Ambulatory Visit (INDEPENDENT_AMBULATORY_CARE_PROVIDER_SITE_OTHER): Payer: Medicare Other | Admitting: Nurse Practitioner

## 2019-01-30 VITALS — BP 153/56 | HR 55 | Resp 14 | Ht 67.0 in | Wt 160.0 lb

## 2019-01-30 DIAGNOSIS — S88111A Complete traumatic amputation at level between knee and ankle, right lower leg, initial encounter: Secondary | ICD-10-CM

## 2019-01-30 DIAGNOSIS — E119 Type 2 diabetes mellitus without complications: Secondary | ICD-10-CM

## 2019-01-30 DIAGNOSIS — E785 Hyperlipidemia, unspecified: Secondary | ICD-10-CM

## 2019-01-30 DIAGNOSIS — Z794 Long term (current) use of insulin: Secondary | ICD-10-CM

## 2019-01-30 MED ORDER — SILVER SULFADIAZINE 1 % EX CREA: 1.0000 "application " | TOPICAL_CREAM | Freq: Every day | CUTANEOUS | 0 refills | Status: DC

## 2019-01-30 MED ORDER — SULFAMETHOXAZOLE-TRIMETHOPRIM 400-80 MG PO TABS: 1.0000 | ORAL_TABLET | Freq: Two times a day (BID) | ORAL | 0 refills | Status: DC

## 2019-01-30 NOTE — Progress Notes (Signed)
SUBJECTIVE:  Patient ID: Boris Angle, male    DOB: January 06, 1935, 84 y.o.   MRN: [id number] Chief Complaint  Patient presents with  . Follow-up    staple removal    HPI  Jaspal Mullan is a 84 y.o. male that presents today for staple removal on his right below-knee amputation.  The patient has a previous left below-knee amputation for which he has prosthesis.  Today the wound is very tender and painful to the patient.  The bottom of the stump is very erythematous and painful to palpation.  The edges of the wound appears the same as at the previous visit with some area of eschar with a little bit of bruising within the medial portion.  Based on discussions with the son, patient's pain may not be adequately controlled by the facility as it is as needed and the patient does not always request the medication.  There is also concern the patient may be developing a contracture.  The patient is still able to flex his knee.  It is not held at a 90 degrees angle.  However it is not completely straight at this time.  Patient denies any fever, chills, nausea, vomiting or diarrhea.  He denies any chest pain or shortness of breath.  Past Medical History:  Diagnosis Date  . Arthritis   . Atrial fibrillation ([place])   . Carcinoma of prostate ([place])   . CHF (congestive heart failure) ([place])   . Chronic kidney disease   . Diabetes mellitus without complication ([place])   . ED (erectile dysfunction)   . Frequent urination   . Hyperlipidemia   . Hypertension   . Moderate mitral insufficiency   . Peripheral vascular disease ([place])   . Pneumonia 04/2016  . Prostate cancer ([place])   . Sleep apnea    OSA--USE C-PAP  . Ulcer of left foot due to type 2 diabetes mellitus ([place])   . Urinary stress incontinence, male     Past Surgical History:  Procedure Laterality Date  . AMPUTATION Left 01/12/2017   Procedure: AMPUTATION BELOW KNEE;  Surgeon: Algernon Huxley, MD;  Location: [facility] ORS;  Service: General;   Laterality: Left;  . AMPUTATION Right 12/27/2018   Procedure: AMPUTATION BELOW KNEE;  Surgeon: Algernon Huxley, MD;  Location: [facility]S;  Service: General;  Laterality: Right;  . APLIGRAFT PLACEMENT Left 10/15/2016   Procedure: APLIGRAFT PLACEMENT;  Surgeon: Albertine Patricia, DPM;  Location: [facility];  Service: Podiatry;  Laterality: Left;  necrotic ulcer  . CHOLECYSTECTOMY    . COLONOSCOPY WITH PROPOFOL N/A 01/02/2019   Procedure: COLONOSCOPY WITH PROPOFOL;  Surgeon: Lucilla Lame, MD;  Location: [facility] ENDOSCOPY;  Service: Endoscopy;  Laterality: N/A;  . COLONOSCOPY WITH PROPOFOL N/A 01/05/2019   Procedure: COLONOSCOPY WITH PROPOFOL;  Surgeon: Lucilla Lame, MD;  Location: [facility] ENDOSCOPY;  Service: Endoscopy;  Laterality: N/A;  . ESOPHAGOGASTRODUODENOSCOPY (EGD) WITH PROPOFOL N/A 01/02/2019   Procedure: ESOPHAGOGASTRODUODENOSCOPY (EGD) WITH PROPOFOL;  Surgeon: Lucilla Lame, MD;  Location: [facility] ENDOSCOPY;  Service: Endoscopy;  Laterality: N/A;  . EYE SURGERY Bilateral    Cataract Extraction with IOL  . HIP ARTHROPLASTY Right 10/13/2018   Procedure: ARTHROPLASTY BIPOLAR HIP (HEMIARTHROPLASTY);  Surgeon: Earnestine Leys, MD;  Location: [facility] ORS;  Service: Orthopedics;  Laterality: Right;  . IRRIGATION AND DEBRIDEMENT FOOT Left 10/15/2016   Procedure: IRRIGATION AND DEBRIDEMENT FOOT-EXCISIONAL DEBRIDEMENT OF SKIN 3RD, 4TH AND 5TH TOES WITH APPLICATION OF APLIGRAFT;  Surgeon: Albertine Patricia, DPM;  Location: [facility];  Service:  Podiatry;  Laterality: Left;  necrotic,gangrene  . IRRIGATION AND DEBRIDEMENT FOOT Left 12/09/2016   Procedure: IRRIGATION AND DEBRIDEMENT FOOT-MUSCLE/FASCIA, RESECTION OF FOURTH AND FIFTH METATARSAL NECROTIC BONE;  Surgeon: Albertine Patricia, DPM;  Location: [facility];  Service: Podiatry;  Laterality: Left;  . IRRIGATION AND DEBRIDEMENT FOOT Right 12/23/2018   Procedure: IRRIGATION AND DEBRIDEMENT FOOT and wound vac application;  Surgeon: Samara Deist, DPM;  Location: [facility]S;  Service:  Podiatry;  Laterality: Right;  . LEFT HEART CATH AND CORONARY ANGIOGRAPHY N/A 06/05/2018   Procedure: LEFT HEART CATH AND CORONARY ANGIOGRAPHY;  Surgeon: Yolonda Kida, MD;  Location: [place] CV LAB;  Service: Cardiovascular;  Laterality: N/A;  . LOWER EXTREMITY ANGIOGRAPHY Left 08/16/2016   Procedure: Lower Extremity Angiography;  Surgeon: Algernon Huxley, MD;  Location: [place] CV LAB;  Service: Cardiovascular;  Laterality: Left;  . LOWER EXTREMITY ANGIOGRAPHY Left 09/01/2016   Procedure: Lower Extremity Angiography;  Surgeon: Algernon Huxley, MD;  Location: [place] CV LAB;  Service: Cardiovascular;  Laterality: Left;  . LOWER EXTREMITY ANGIOGRAPHY Left 10/14/2016   Procedure: Lower Extremity Angiography;  Surgeon: Algernon Huxley, MD;  Location: [place] CV LAB;  Service: Cardiovascular;  Laterality: Left;  . LOWER EXTREMITY ANGIOGRAPHY Left 12/20/2016   Procedure: Lower Extremity Angiography;  Surgeon: Algernon Huxley, MD;  Location: [place] CV LAB;  Service: Cardiovascular;  Laterality: Left;  . LOWER EXTREMITY ANGIOGRAPHY Right 12/18/2018   Procedure: LOWER EXTREMITY ANGIOGRAPHY;  Surgeon: Algernon Huxley, MD;  Location: [place] CV LAB;  Service: Cardiovascular;  Laterality: Right;  . LOWER EXTREMITY INTERVENTION  09/01/2016   Procedure: Lower Extremity Intervention;  Surgeon: Algernon Huxley, MD;  Location: [place] CV LAB;  Service: Cardiovascular;;  . PROSTATE SURGERY     removal  . TONSILLECTOMY     as a child    Social History   Socioeconomic History  . Marital status: Married    Spouse name: Not on file  . Number of children: Not on file  . Years of education: Not on file  . Highest education level: Not on file  Occupational History  . Occupation: retired  Scientific laboratory technician  . Financial resource strain: Not on file  . Food insecurity    Worry: Not on file    Inability: Not on file  . Transportation needs    Medical: Not on file    Non-medical: Not on file   Tobacco Use  . Smoking status: Former Smoker    Packs/day: 0.25    Types: Cigarettes    Quit date: 10/14/1994    Years since quitting: 24.3  . Smokeless tobacco: Never Used  Substance and Sexual Activity  . Alcohol use: No    Alcohol/week: 0.0 standard drinks  . Drug use: No  . Sexual activity: Not on file  Lifestyle  . Physical activity    Days per week: Not on file    Minutes per session: Not on file  . Stress: Not on file  Relationships  . Social Herbalist on phone: Not on file    Gets together: Not on file    Attends religious service: Not on file    Active member of club or organization: Not on file    Attends meetings of clubs or organizations: Not on file    Relationship status: Not on file  . Intimate partner violence    Fear of current or ex partner: Not on file  Emotionally abused: Not on file    Physically abused: Not on file    Forced sexual activity: Not on file  Other Topics Concern  . Not on file  Social History Narrative   Lives at home with wife, has a wheelchair    Family History  Problem Relation Age of Onset  . Hypertension Mother   . Diabetes Mother   . Prostate cancer Neg Hx   . Bladder Cancer Neg Hx   . Kidney disease Neg Hx     Allergies  Allergen Reactions  . Contrast Media [Iodinated Diagnostic Agents] Shortness Of Breath    SOB  . Iohexol Shortness Of Breath     Desc: Respiratory Distress, Laryngedema, diaphoresis, Onset Date: [id number]   . Metrizamide Shortness Of Breath    SOB SOB SOB   . Latex Rash     Review of Systems   Review of Systems: Negative Unless Checked Constitutional: [] Weight loss  [] Fever  [] Chills Cardiac: [] Chest pain   []  Atrial Fibrillation  [] Palpitations   [] Shortness of breath when laying flat   [] Shortness of breath with exertion. [] Shortness of breath at rest Vascular:  [] Pain in legs with walking   [] Pain in legs with standing [] Pain in legs when laying flat   [] Claudication    [] Pain  in feet when laying flat    [] History of DVT   [] Phlebitis   [] Swelling in legs   [] Varicose veins   [] Non-healing ulcers Pulmonary:   [] Uses home oxygen   [] Productive cough   [] Hemoptysis   [] Wheeze  [] COPD   [] Asthma Neurologic:  [] Dizziness   [] Seizures  [] Blackouts [] History of stroke   [] History of TIA  [] Aphasia   [] Temporary Blindness   [] Weakness or numbness in arm   [] Weakness or numbness in leg Musculoskeletal:   [] Joint swelling   [] Joint pain   [] Low back pain  []  History of Knee Replacement [] Arthritis [] back Surgeries  []  Spinal Stenosis    Hematologic:  [] Easy bruising  [] Easy bleeding   [] Hypercoagulable state   [] Anemic Gastrointestinal:  [] Diarrhea   [] Vomiting  [] Gastroesophageal reflux/heartburn   [] Difficulty swallowing. [] Abdominal pain Genitourinary:  [] Chronic kidney disease   [] Difficult urination  [] Anuric   [] Blood in urine [] Frequent urination  [] Burning with urination   [] Hematuria Skin:  [] Rashes   [] Ulcers [] Wounds Psychological:  [] History of anxiety   []  History of major depression  []  Memory Difficulties      OBJECTIVE:   Physical Exam  BP (!) 153/56 (BP Location: Left Arm)   Pulse (!) 55   Resp 14   Ht 5\' 7"  (1.702 m)   Wt 160 lb (72.6 kg)   BMI 25.06 kg/m   Gen: WD/WN, NAD Head: [place]/AT, No temporalis wasting.  Ear/Nose/Throat: Hearing grossly intact, nares w/o erythema or drainage Eyes: PER, EOMI, sclera nonicteric.  Neck: Supple, no masses.  No JVD.  Pulmonary:  Good air movement, no use of accessory muscles.  Cardiac: RRR Vascular:  Right below-knee amputation with eschar near the medial portion. Vessel Right Left  Radial Palpable Palpable  Gastrointestinal: soft, non-distended. No guarding/no peritoneal signs.  Musculoskeletal: M/S 5/5 throughout.    Bilateral below-knee amputations Neurologic: Pain and light touch intact in extremities.  Symmetrical.  Speech is fluent. Motor exam as listed above. Psychiatric: Judgment intact, Mood & affect  appropriate for pt's clinical situation. Dermatologic: No Venous rashes. No Ulcers Noted.  No changes consistent with cellulitis. Lymph : No Cervical lymphadenopathy, no lichenification or skin changes of  chronic lymphedema.       ASSESSMENT AND PLAN:  1. Below-knee amputation of right lower extremity ([place]) I had a long discussion with the patient and his son about the importance of keeping the knee straight in order to avoid developing a contracture.  This is due to the fact that once her contracture develops the patient will be unable to wear her prosthesis.  While, amputation is not absolutely necessary with a contracture, it does limit the patient's mobility options.  Therefore it was made extremely clear to the patient and his son that if a contracture were to develop and the patient want to utilize a prosthesis he would require a revision to an above-knee amputation.  As far as the wound is concerned, there is loosening around the area of eschar.  We will send the patient with sulfa Silvadene cream in order to see if this helps to progress with some wound healing.  We will also continue the patient's antibiotic for concern for infection.  The patient tolerated staple removal well.  The patient will return in 1 to 2 weeks for further staple removal as well as to discuss next steps depending on progression with physical therapy. - silver sulfADIAZINE (SILVADENE) 1 % cream; Apply 1 application topically daily.  Dispense: 50 g; Refill: 0 - sulfamethoxazole-trimethoprim (BACTRIM) 400-80 MG tablet; Take 1 tablet by mouth 2 (two) times daily.  Dispense: 20 tablet; Refill: 0  2. Type 2 diabetes mellitus treated with insulin ([place]) Continue hypoglycemic medications as already ordered, these medications have been reviewed and there are no changes at this time.  Hgb A1C to be monitored as already arranged by primary service   3. Hyperlipidemia, unspecified hyperlipidemia type Continue statin as  ordered and reviewed, no changes at this time   Current Outpatient Medications on File Prior to Visit  Medication Sig Dispense Refill  . acetaminophen (TYLENOL) 325 MG tablet Take 650 mg by mouth every 6 (six) hours as needed.    Marland Kitchen amiodarone (PACERONE) 200 MG tablet Take 200 mg by mouth daily.    Marland Kitchen aspirin EC 81 MG tablet Take 1 tablet (81 mg total) by mouth daily. 150 tablet 2  . bisacodyl (DULCOLAX) 5 MG EC tablet Take 1 tablet (5 mg total) by mouth daily as needed for moderate constipation. 30 tablet 0  . collagenase (SANTYL) ointment Apply 1 application topically daily.    Marland Kitchen diltiazem (CARDIZEM CD) 120 MG 24 hr capsule Take 120 mg by mouth daily.    . Ensure Max Protein (ENSURE MAX PROTEIN) LIQD Take 330 mLs (11 oz total) by mouth 2 (two) times daily.    . ferrous sulfate 325 (65 FE) MG tablet Take 1 tablet (325 mg total) by mouth daily with breakfast. 30 tablet 3  . HYDROcodone-acetaminophen (NORCO/VICODIN) 5-325 MG tablet Take 1-2 tablets by mouth every 6 (six) hours as needed for moderate pain (pain score 4-6). 30 tablet 0  . insulin glargine (LANTUS) 100 UNIT/ML injection Inject 0.15 mLs (15 Units total) into the skin at bedtime. 1 vial 0  . insulin lispro (HUMALOG) 100 UNIT/ML injection Inject 0.03 mLs (3 Units total) into the skin 3 (three) times daily with meals. (Patient taking differently: Inject 3 Units into the skin 3 (three) times daily with meals. Plus 1 additional unit for every 50 BG points over 200 (max 5 additional units per dose)) 10 mL 0  . metoprolol tartrate (LOPRESSOR) 25 MG tablet Take 1 tablet (25 mg total) by mouth 3 (three)  times daily. 90 tablet 0  . Multiple Vitamin (MULTIVITAMIN WITH MINERALS) TABS tablet Take 1 tablet by mouth daily.    . nitroGLYCERIN (NITROSTAT) 0.4 MG SL tablet Place 1 tablet (0.4 mg total) under the tongue every 5 (five) minutes as needed for chest pain. 20 tablet 0  . Rivaroxaban (XARELTO) 15 MG TABS tablet Take 1 tablet (15 mg total) by  mouth daily with supper. Hold Xarelto for 3 days.  Start on January 09, 2019. 30 tablet 1  . senna (SENOKOT) 8.6 MG TABS tablet Take 1 tablet (8.6 mg total) by mouth daily as needed for mild constipation. 120 tablet 0  . vitamin C (VITAMIN C) 250 MG tablet Take 1 tablet (250 mg total) by mouth 2 (two) times daily.    Marland Kitchen atorvastatin (LIPITOR) 80 MG tablet Take 80 mg by mouth every evening.     Current Facility-Administered Medications on File Prior to Visit  Medication Dose Route Frequency Provider Last Rate Last Dose  . diphenhydrAMINE (BENADRYL) injection 50 mg  50 mg Intravenous Once PRN Stegmayer, Kimberly A, PA-C      . famotidine (PEPCID) tablet 40 mg  40 mg Oral Once PRN Stegmayer, Janalyn Harder, PA-C        There are no Patient Instructions on file for this visit. No follow-ups on file.   Kris Hartmann, NP  This note was completed with Sales executive.  Any errors are purely unintentional.

## 2019-02-08 ENCOUNTER — Inpatient Hospital Stay (HOSPITAL_COMMUNITY)
Admission: AD | Admit: 2019-02-08 | Discharge: 2019-02-19 | DRG: 871 | Disposition: A | Discharge status: Skilled Nursing Facility | Payer: Medicare Other | Source: Other Acute Inpatient Hospital | Attending: Internal Medicine | Admitting: Internal Medicine

## 2019-02-08 ENCOUNTER — Emergency Department: Payer: Medicare Other

## 2019-02-08 ENCOUNTER — Encounter (HOSPITAL_COMMUNITY): Payer: Self-pay

## 2019-02-08 ENCOUNTER — Other Ambulatory Visit: Payer: Self-pay

## 2019-02-08 ENCOUNTER — Inpatient Hospital Stay
Admission: EM | Admit: 2019-02-08 | Discharge: 2019-02-08 | DRG: 871 | Disposition: A | Discharge status: Other Acute Inpatient Hospital | Payer: Medicare Other | Attending: Internal Medicine | Admitting: Internal Medicine

## 2019-02-08 ENCOUNTER — Encounter: Payer: Self-pay | Admitting: Emergency Medicine

## 2019-02-08 DIAGNOSIS — I130 Hypertensive heart and chronic kidney disease with heart failure and stage 1 through stage 4 chronic kidney disease, or unspecified chronic kidney disease: Secondary | ICD-10-CM | POA: Diagnosis present

## 2019-02-08 DIAGNOSIS — G9341 Metabolic encephalopathy: Secondary | ICD-10-CM | POA: Diagnosis not present

## 2019-02-08 DIAGNOSIS — Z8546 Personal history of malignant neoplasm of prostate: Secondary | ICD-10-CM

## 2019-02-08 DIAGNOSIS — D500 Iron deficiency anemia secondary to blood loss (chronic): Secondary | ICD-10-CM | POA: Diagnosis not present

## 2019-02-08 DIAGNOSIS — Z89511 Acquired absence of right leg below knee: Secondary | ICD-10-CM | POA: Diagnosis not present

## 2019-02-08 DIAGNOSIS — Z91040 Latex allergy status: Secondary | ICD-10-CM | POA: Diagnosis not present

## 2019-02-08 DIAGNOSIS — J1282 Pneumonia due to coronavirus disease 2019: Secondary | ICD-10-CM | POA: Diagnosis present

## 2019-02-08 DIAGNOSIS — J1289 Other viral pneumonia: Secondary | ICD-10-CM | POA: Diagnosis present

## 2019-02-08 DIAGNOSIS — R739 Hyperglycemia, unspecified: Secondary | ICD-10-CM | POA: Diagnosis not present

## 2019-02-08 DIAGNOSIS — Z8249 Family history of ischemic heart disease and other diseases of the circulatory system: Secondary | ICD-10-CM

## 2019-02-08 DIAGNOSIS — I10 Essential (primary) hypertension: Secondary | ICD-10-CM | POA: Diagnosis not present

## 2019-02-08 DIAGNOSIS — E1151 Type 2 diabetes mellitus with diabetic peripheral angiopathy without gangrene: Secondary | ICD-10-CM | POA: Diagnosis present

## 2019-02-08 DIAGNOSIS — S88111A Complete traumatic amputation at level between knee and ankle, right lower leg, initial encounter: Secondary | ICD-10-CM | POA: Diagnosis not present

## 2019-02-08 DIAGNOSIS — Z888 Allergy status to other drugs, medicaments and biological substances status: Secondary | ICD-10-CM

## 2019-02-08 DIAGNOSIS — J9601 Acute respiratory failure with hypoxia: Secondary | ICD-10-CM | POA: Diagnosis present

## 2019-02-08 DIAGNOSIS — Z9079 Acquired absence of other genital organ(s): Secondary | ICD-10-CM

## 2019-02-08 DIAGNOSIS — I5021 Acute systolic (congestive) heart failure: Secondary | ICD-10-CM | POA: Diagnosis not present

## 2019-02-08 DIAGNOSIS — L8942 Pressure ulcer of contiguous site of back, buttock and hip, stage 2: Secondary | ICD-10-CM | POA: Diagnosis not present

## 2019-02-08 DIAGNOSIS — U071 COVID-19: Secondary | ICD-10-CM | POA: Diagnosis present

## 2019-02-08 DIAGNOSIS — I482 Chronic atrial fibrillation, unspecified: Secondary | ICD-10-CM | POA: Diagnosis present

## 2019-02-08 DIAGNOSIS — Z79899 Other long term (current) drug therapy: Secondary | ICD-10-CM

## 2019-02-08 DIAGNOSIS — M1990 Unspecified osteoarthritis, unspecified site: Secondary | ICD-10-CM | POA: Diagnosis present

## 2019-02-08 DIAGNOSIS — L89892 Pressure ulcer of other site, stage 2: Secondary | ICD-10-CM | POA: Diagnosis present

## 2019-02-08 DIAGNOSIS — I480 Paroxysmal atrial fibrillation: Secondary | ICD-10-CM | POA: Diagnosis present

## 2019-02-08 DIAGNOSIS — E119 Type 2 diabetes mellitus without complications: Secondary | ICD-10-CM | POA: Diagnosis not present

## 2019-02-08 DIAGNOSIS — Z91041 Radiographic dye allergy status: Secondary | ICD-10-CM

## 2019-02-08 DIAGNOSIS — I25118 Atherosclerotic heart disease of native coronary artery with other forms of angina pectoris: Secondary | ICD-10-CM | POA: Diagnosis present

## 2019-02-08 DIAGNOSIS — G4733 Obstructive sleep apnea (adult) (pediatric): Secondary | ICD-10-CM | POA: Diagnosis present

## 2019-02-08 DIAGNOSIS — I5023 Acute on chronic systolic (congestive) heart failure: Secondary | ICD-10-CM | POA: Diagnosis not present

## 2019-02-08 DIAGNOSIS — R410 Disorientation, unspecified: Secondary | ICD-10-CM | POA: Diagnosis not present

## 2019-02-08 DIAGNOSIS — K5900 Constipation, unspecified: Secondary | ICD-10-CM | POA: Diagnosis present

## 2019-02-08 DIAGNOSIS — E11649 Type 2 diabetes mellitus with hypoglycemia without coma: Secondary | ICD-10-CM | POA: Diagnosis not present

## 2019-02-08 DIAGNOSIS — Z9981 Dependence on supplemental oxygen: Secondary | ICD-10-CM | POA: Diagnosis not present

## 2019-02-08 DIAGNOSIS — E1122 Type 2 diabetes mellitus with diabetic chronic kidney disease: Secondary | ICD-10-CM | POA: Diagnosis present

## 2019-02-08 DIAGNOSIS — R0602 Shortness of breath: Secondary | ICD-10-CM | POA: Diagnosis present

## 2019-02-08 DIAGNOSIS — N179 Acute kidney failure, unspecified: Secondary | ICD-10-CM | POA: Diagnosis present

## 2019-02-08 DIAGNOSIS — Z794 Long term (current) use of insulin: Secondary | ICD-10-CM | POA: Diagnosis not present

## 2019-02-08 DIAGNOSIS — E872 Acidosis: Secondary | ICD-10-CM | POA: Diagnosis present

## 2019-02-08 DIAGNOSIS — Z89512 Acquired absence of left leg below knee: Secondary | ICD-10-CM

## 2019-02-08 DIAGNOSIS — E43 Unspecified severe protein-calorie malnutrition: Secondary | ICD-10-CM

## 2019-02-08 DIAGNOSIS — A419 Sepsis, unspecified organism: Secondary | ICD-10-CM

## 2019-02-08 DIAGNOSIS — E785 Hyperlipidemia, unspecified: Secondary | ICD-10-CM | POA: Diagnosis present

## 2019-02-08 DIAGNOSIS — Z87891 Personal history of nicotine dependence: Secondary | ICD-10-CM

## 2019-02-08 DIAGNOSIS — E1165 Type 2 diabetes mellitus with hyperglycemia: Secondary | ICD-10-CM | POA: Diagnosis not present

## 2019-02-08 DIAGNOSIS — E118 Type 2 diabetes mellitus with unspecified complications: Secondary | ICD-10-CM

## 2019-02-08 DIAGNOSIS — T8131XA Disruption of external operation (surgical) wound, not elsewhere classified, initial encounter: Secondary | ICD-10-CM | POA: Diagnosis present

## 2019-02-08 DIAGNOSIS — Z7982 Long term (current) use of aspirin: Secondary | ICD-10-CM | POA: Diagnosis not present

## 2019-02-08 DIAGNOSIS — N189 Chronic kidney disease, unspecified: Secondary | ICD-10-CM | POA: Diagnosis present

## 2019-02-08 DIAGNOSIS — Z7901 Long term (current) use of anticoagulants: Secondary | ICD-10-CM | POA: Diagnosis not present

## 2019-02-08 DIAGNOSIS — A4189 Other specified sepsis: Secondary | ICD-10-CM | POA: Diagnosis present

## 2019-02-08 DIAGNOSIS — Z833 Family history of diabetes mellitus: Secondary | ICD-10-CM

## 2019-02-08 DIAGNOSIS — N183 Chronic kidney disease, stage 3 (moderate): Secondary | ICD-10-CM | POA: Diagnosis present

## 2019-02-08 DIAGNOSIS — Z23 Encounter for immunization: Secondary | ICD-10-CM

## 2019-02-08 DIAGNOSIS — L89312 Pressure ulcer of right buttock, stage 2: Secondary | ICD-10-CM | POA: Diagnosis present

## 2019-02-08 DIAGNOSIS — Z96641 Presence of right artificial hip joint: Secondary | ICD-10-CM | POA: Diagnosis present

## 2019-02-08 DIAGNOSIS — L8990 Pressure ulcer of unspecified site, unspecified stage: Secondary | ICD-10-CM | POA: Diagnosis present

## 2019-02-08 DIAGNOSIS — J189 Pneumonia, unspecified organism: Secondary | ICD-10-CM

## 2019-02-08 DIAGNOSIS — S88112A Complete traumatic amputation at level between knee and ankle, left lower leg, initial encounter: Secondary | ICD-10-CM | POA: Diagnosis present

## 2019-02-08 DIAGNOSIS — F0390 Unspecified dementia without behavioral disturbance: Secondary | ICD-10-CM | POA: Diagnosis present

## 2019-02-08 DIAGNOSIS — I739 Peripheral vascular disease, unspecified: Secondary | ICD-10-CM | POA: Diagnosis present

## 2019-02-08 DIAGNOSIS — D649 Anemia, unspecified: Secondary | ICD-10-CM | POA: Diagnosis present

## 2019-02-08 HISTORY — DX: Acute respiratory failure with hypoxia: J96.01

## 2019-02-08 LAB — CBC WITH DIFFERENTIAL/PLATELET
Abs Immature Granulocytes: 0.13 10*3/uL — ABNORMAL HIGH (ref 0.00–0.07)
Basophils Absolute: 0.1 10*3/uL (ref 0.0–0.1)
Basophils Relative: 1 %
Eosinophils Absolute: 0.6 10*3/uL — ABNORMAL HIGH (ref 0.0–0.5)
Eosinophils Relative: 3 %
HCT: 31.8 % — ABNORMAL LOW (ref 39.0–52.0)
Hemoglobin: 10 g/dL — ABNORMAL LOW (ref 13.0–17.0)
Immature Granulocytes: 1 %
Lymphocytes Relative: 6 %
Lymphs Abs: 1 10*3/uL (ref 0.7–4.0)
MCH: 27.9 pg (ref 26.0–34.0)
MCHC: 31.4 g/dL (ref 30.0–36.0)
MCV: 88.6 fL (ref 80.0–100.0)
Monocytes Absolute: 0.7 10*3/uL (ref 0.1–1.0)
Monocytes Relative: 4 %
Neutro Abs: 15 10*3/uL — ABNORMAL HIGH (ref 1.7–7.7)
Neutrophils Relative %: 85 %
Platelets: 410 10*3/uL — ABNORMAL HIGH (ref 150–400)
RBC: 3.59 MIL/uL — ABNORMAL LOW (ref 4.22–5.81)
RDW: 14.9 % (ref 11.5–15.5)
WBC: 17.5 10*3/uL — ABNORMAL HIGH (ref 4.0–10.5)
nRBC: 0 % (ref 0.0–0.2)

## 2019-02-08 LAB — HEPATIC FUNCTION PANEL
ALT: 19 U/L (ref 0–44)
AST: 27 U/L (ref 15–41)
Albumin: 2.6 g/dL — ABNORMAL LOW (ref 3.5–5.0)
Alkaline Phosphatase: 131 U/L — ABNORMAL HIGH (ref 38–126)
Bilirubin, Direct: 0.1 mg/dL (ref 0.0–0.2)
Indirect Bilirubin: 0.4 mg/dL (ref 0.3–0.9)
Total Bilirubin: 0.5 mg/dL (ref 0.3–1.2)
Total Protein: 6.6 g/dL (ref 6.5–8.1)

## 2019-02-08 LAB — C-REACTIVE PROTEIN: CRP: 1.6 mg/dL — ABNORMAL HIGH (ref <1.0)

## 2019-02-08 LAB — APTT: aPTT: 33 seconds (ref 24–36)

## 2019-02-08 LAB — SARS CORONAVIRUS 2 BY RT PCR (HOSPITAL ORDER, PERFORMED IN ~~LOC~~ HOSPITAL LAB): SARS Coronavirus 2: POSITIVE — AB

## 2019-02-08 LAB — PROTIME-INR
INR: 3 — ABNORMAL HIGH (ref 0.8–1.2)
Prothrombin Time: 30.5 seconds — ABNORMAL HIGH (ref 11.4–15.2)

## 2019-02-08 LAB — BASIC METABOLIC PANEL
Anion gap: 9 (ref 5–15)
BUN: 36 mg/dL — ABNORMAL HIGH (ref 8–23)
CO2: 21 mmol/L — ABNORMAL LOW (ref 22–32)
Calcium: 8.1 mg/dL — ABNORMAL LOW (ref 8.9–10.3)
Chloride: 105 mmol/L (ref 98–111)
Creatinine, Ser: 1.38 mg/dL — ABNORMAL HIGH (ref 0.61–1.24)
GFR calc Af Amer: 55 mL/min — ABNORMAL LOW (ref >60)
GFR calc non Af Amer: 48 mL/min — ABNORMAL LOW (ref >60)
Glucose, Bld: 265 mg/dL — ABNORMAL HIGH (ref 70–99)
Potassium: 4.2 mmol/L (ref 3.5–5.1)
Sodium: 135 mmol/L (ref 135–145)

## 2019-02-08 LAB — PROCALCITONIN: Procalcitonin: 0.54 ng/mL

## 2019-02-08 LAB — MRSA PCR SCREENING: MRSA by PCR: NEGATIVE

## 2019-02-08 LAB — ABO/RH: ABO/RH(D): O POS

## 2019-02-08 LAB — FERRITIN: Ferritin: 69 ng/mL (ref 24–336)

## 2019-02-08 LAB — GLUCOSE, CAPILLARY
Glucose-Capillary: 102 mg/dL — ABNORMAL HIGH (ref 70–99)
Glucose-Capillary: 249 mg/dL — ABNORMAL HIGH (ref 70–99)

## 2019-02-08 LAB — BRAIN NATRIURETIC PEPTIDE: B Natriuretic Peptide: 839 pg/mL — ABNORMAL HIGH (ref 0.0–100.0)

## 2019-02-08 LAB — LACTIC ACID, PLASMA
Lactic Acid, Venous: 2.4 mmol/L (ref 0.5–1.9)
Lactic Acid, Venous: 2.3 mmol/L (ref 0.5–1.9)

## 2019-02-08 MED ORDER — INSULIN ASPART 100 UNIT/ML ~~LOC~~ SOLN
0.0000 [IU] | Freq: Three times a day (TID) | SUBCUTANEOUS | Status: DC
  Administered 2019-02-09: 7 [IU] via SUBCUTANEOUS
  Administered 2019-02-09: 15 [IU] via SUBCUTANEOUS
  Administered 2019-02-09: 17:00:00 4 [IU] via SUBCUTANEOUS
  Administered 2019-02-10: 15 [IU] via SUBCUTANEOUS
  Administered 2019-02-10 – 2019-02-11 (×3): 7 [IU] via SUBCUTANEOUS
  Administered 2019-02-11: 20 [IU] via SUBCUTANEOUS
  Administered 2019-02-12: 7 [IU] via SUBCUTANEOUS
  Administered 2019-02-12: 4 [IU] via SUBCUTANEOUS
  Administered 2019-02-14: 3 [IU] via SUBCUTANEOUS
  Administered 2019-02-14: 4 [IU] via SUBCUTANEOUS
  Administered 2019-02-15 – 2019-02-16 (×2): 3 [IU] via SUBCUTANEOUS
  Administered 2019-02-16: 7 [IU] via SUBCUTANEOUS
  Administered 2019-02-17: 3 [IU] via SUBCUTANEOUS
  Administered 2019-02-17: 11 [IU] via SUBCUTANEOUS
  Administered 2019-02-17: 4 [IU] via SUBCUTANEOUS
  Administered 2019-02-18: 20 [IU] via SUBCUTANEOUS
  Administered 2019-02-18: 7 [IU] via SUBCUTANEOUS
  Administered 2019-02-19: 3 [IU] via SUBCUTANEOUS

## 2019-02-08 MED ORDER — ADULT MULTIVITAMIN W/MINERALS CH
1.0000 | ORAL_TABLET | Freq: Every day | ORAL | Status: DC
  Administered 2019-02-08 – 2019-02-19 (×12): 1 via ORAL
  Filled 2019-02-08 (×12): qty 1

## 2019-02-08 MED ORDER — SILVER SULFADIAZINE 1 % EX CREA
1.0000 "application " | TOPICAL_CREAM | Freq: Every day | CUTANEOUS | Status: DC
  Administered 2019-02-09: 1 via TOPICAL
  Filled 2019-02-08: qty 85

## 2019-02-08 MED ORDER — ATORVASTATIN CALCIUM 40 MG PO TABS
80.0000 mg | ORAL_TABLET | Freq: Every evening | ORAL | Status: DC
  Administered 2019-02-08 – 2019-02-18 (×11): 80 mg via ORAL
  Filled 2019-02-08 (×12): qty 2

## 2019-02-08 MED ORDER — SODIUM CHLORIDE 0.9% FLUSH: 3.0000 mL | INTRAVENOUS | Status: DC | PRN

## 2019-02-08 MED ORDER — SULFAMETHOXAZOLE-TRIMETHOPRIM 400-80 MG PO TABS
1.0000 | ORAL_TABLET | Freq: Two times a day (BID) | ORAL | Status: DC
  Filled 2019-02-08 (×2): qty 1

## 2019-02-08 MED ORDER — VITAMIN C 500 MG PO TABS
500.0000 mg | ORAL_TABLET | Freq: Every day | ORAL | Status: DC
  Administered 2019-02-08 – 2019-02-19 (×12): 500 mg via ORAL
  Filled 2019-02-08 (×12): qty 1

## 2019-02-08 MED ORDER — HYDROCOD POLST-CPM POLST ER 10-8 MG/5ML PO SUER: 5.0000 mL | Freq: Two times a day (BID) | ORAL | Status: DC | PRN

## 2019-02-08 MED ORDER — ORAL CARE MOUTH RINSE
15.0000 mL | Freq: Two times a day (BID) | OROMUCOSAL | Status: DC
  Administered 2019-02-08 – 2019-02-19 (×19): 15 mL via OROMUCOSAL

## 2019-02-08 MED ORDER — INSULIN GLARGINE 100 UNIT/ML ~~LOC~~ SOLN
15.0000 [IU] | Freq: Every day | SUBCUTANEOUS | Status: DC
  Administered 2019-02-08: 15 [IU] via SUBCUTANEOUS
  Filled 2019-02-08: qty 0.15

## 2019-02-08 MED ORDER — BISACODYL 5 MG PO TBEC
5.0000 mg | DELAYED_RELEASE_TABLET | Freq: Every day | ORAL | Status: DC | PRN
  Administered 2019-02-08: 18:00:00 5 mg via ORAL
  Filled 2019-02-08: qty 1

## 2019-02-08 MED ORDER — ACETAMINOPHEN 325 MG PO TABS
650.0000 mg | ORAL_TABLET | Freq: Four times a day (QID) | ORAL | Status: DC | PRN
  Administered 2019-02-14: 650 mg via ORAL
  Filled 2019-02-08: qty 2

## 2019-02-08 MED ORDER — DILTIAZEM HCL ER COATED BEADS 120 MG PO CP24
120.0000 mg | ORAL_CAPSULE | Freq: Every day | ORAL | Status: DC
  Administered 2019-02-09 – 2019-02-19 (×9): 120 mg via ORAL
  Filled 2019-02-08 (×9): qty 1

## 2019-02-08 MED ORDER — METOPROLOL TARTRATE 25 MG PO TABS
25.0000 mg | ORAL_TABLET | Freq: Three times a day (TID) | ORAL | Status: DC
  Administered 2019-02-08 – 2019-02-09 (×3): 25 mg via ORAL
  Filled 2019-02-08 (×3): qty 1

## 2019-02-08 MED ORDER — INSULIN ASPART 100 UNIT/ML ~~LOC~~ SOLN
0.0000 [IU] | Freq: Every day | SUBCUTANEOUS | Status: DC
  Administered 2019-02-08: 2 [IU] via SUBCUTANEOUS
  Administered 2019-02-09: 22:00:00 3 [IU] via SUBCUTANEOUS
  Administered 2019-02-10: 4 [IU] via SUBCUTANEOUS
  Administered 2019-02-14: 3 [IU] via SUBCUTANEOUS
  Administered 2019-02-16 – 2019-02-17 (×2): 2 [IU] via SUBCUTANEOUS

## 2019-02-08 MED ORDER — POLYETHYLENE GLYCOL 3350 17 G PO PACK
17.0000 g | PACK | Freq: Two times a day (BID) | ORAL | Status: DC
  Administered 2019-02-08 – 2019-02-17 (×10): 17 g via ORAL
  Filled 2019-02-08 (×13): qty 1

## 2019-02-08 MED ORDER — FUROSEMIDE 10 MG/ML IJ SOLN
40.0000 mg | Freq: Once | INTRAMUSCULAR | Status: AC
  Administered 2019-02-08: 40 mg via INTRAVENOUS
  Filled 2019-02-08: qty 4

## 2019-02-08 MED ORDER — SODIUM CHLORIDE 0.9 % IV SOLN
500.0000 mg | INTRAVENOUS | Status: DC
  Administered 2019-02-08: 500 mg via INTRAVENOUS
  Filled 2019-02-08: qty 500

## 2019-02-08 MED ORDER — SODIUM CHLORIDE 0.9 % IV SOLN
2.0000 g | INTRAVENOUS | Status: DC
  Administered 2019-02-08: 2 g via INTRAVENOUS
  Filled 2019-02-08: qty 20

## 2019-02-08 MED ORDER — SENNA 8.6 MG PO TABS
2.0000 | ORAL_TABLET | Freq: Every day | ORAL | Status: DC
  Administered 2019-02-08 – 2019-02-18 (×8): 17.2 mg via ORAL
  Filled 2019-02-08 (×9): qty 2

## 2019-02-08 MED ORDER — ENSURE ENLIVE PO LIQD
237.0000 mL | Freq: Two times a day (BID) | ORAL | Status: DC
  Administered 2019-02-09 (×2): 237 mL via ORAL

## 2019-02-08 MED ORDER — GUAIFENESIN-DM 100-10 MG/5ML PO SYRP: 10.0000 mL | ORAL_SOLUTION | ORAL | Status: DC | PRN

## 2019-02-08 MED ORDER — SODIUM CHLORIDE 0.9 % IV SOLN: 250.0000 mL | INTRAVENOUS | Status: DC | PRN

## 2019-02-08 MED ORDER — ENSURE MAX PROTEIN PO LIQD
11.0000 [oz_av] | Freq: Two times a day (BID) | ORAL | Status: DC
  Administered 2019-02-08 – 2019-02-09 (×2): 11 [oz_av] via ORAL
  Filled 2019-02-08 (×4): qty 330

## 2019-02-08 MED ORDER — SODIUM CHLORIDE 0.9 % IV BOLUS
500.0000 mL | Freq: Once | INTRAVENOUS | Status: AC
  Administered 2019-02-08: 500 mL via INTRAVENOUS

## 2019-02-08 MED ORDER — HYDROCODONE-ACETAMINOPHEN 5-325 MG PO TABS
1.0000 | ORAL_TABLET | Freq: Four times a day (QID) | ORAL | Status: DC | PRN
  Administered 2019-02-08: 2 via ORAL
  Administered 2019-02-09 – 2019-02-10 (×2): 1 via ORAL
  Administered 2019-02-11 – 2019-02-14 (×6): 2 via ORAL
  Administered 2019-02-15 – 2019-02-18 (×6): 1 via ORAL
  Administered 2019-02-18: 2 via ORAL
  Filled 2019-02-08: qty 1
  Filled 2019-02-08: qty 2
  Filled 2019-02-08: qty 1
  Filled 2019-02-08 (×6): qty 2
  Filled 2019-02-08 (×5): qty 1
  Filled 2019-02-08: qty 2
  Filled 2019-02-08: qty 1

## 2019-02-08 MED ORDER — FERROUS SULFATE 325 (65 FE) MG PO TABS
325.0000 mg | ORAL_TABLET | Freq: Every day | ORAL | Status: DC
  Administered 2019-02-09 – 2019-02-19 (×11): 325 mg via ORAL
  Filled 2019-02-08 (×11): qty 1

## 2019-02-08 MED ORDER — SODIUM CHLORIDE 0.9 % IV SOLN
100.0000 mg | INTRAVENOUS | Status: DC
  Filled 2019-02-08: qty 20

## 2019-02-08 MED ORDER — SODIUM CHLORIDE 0.9% FLUSH
3.0000 mL | Freq: Two times a day (BID) | INTRAVENOUS | Status: DC
  Administered 2019-02-08 – 2019-02-19 (×20): 3 mL via INTRAVENOUS

## 2019-02-08 MED ORDER — SODIUM CHLORIDE 0.9% FLUSH
3.0000 mL | Freq: Two times a day (BID) | INTRAVENOUS | Status: DC
  Administered 2019-02-08 – 2019-02-18 (×18): 3 mL via INTRAVENOUS

## 2019-02-08 MED ORDER — DEXAMETHASONE SODIUM PHOSPHATE 10 MG/ML IJ SOLN
6.0000 mg | INTRAMUSCULAR | Status: DC
  Administered 2019-02-08 – 2019-02-10 (×3): 6 mg via INTRAVENOUS
  Filled 2019-02-08 (×3): qty 1

## 2019-02-08 MED ORDER — SULFAMETHOXAZOLE-TRIMETHOPRIM 400-80 MG PO TABS
1.0000 | ORAL_TABLET | Freq: Two times a day (BID) | ORAL | Status: DC
  Administered 2019-02-08 – 2019-02-09 (×2): 1 via ORAL
  Filled 2019-02-08 (×2): qty 1

## 2019-02-08 MED ORDER — ONDANSETRON HCL 4 MG PO TABS: 4.0000 mg | ORAL_TABLET | Freq: Four times a day (QID) | ORAL | Status: DC | PRN

## 2019-02-08 MED ORDER — INFLUENZA VAC A&B SA ADJ QUAD 0.5 ML IM PRSY
0.5000 mL | PREFILLED_SYRINGE | INTRAMUSCULAR | Status: AC
  Administered 2019-02-09: 0.5 mL via INTRAMUSCULAR
  Filled 2019-02-08: qty 0.5

## 2019-02-08 MED ORDER — RIVAROXABAN 15 MG PO TABS
15.0000 mg | ORAL_TABLET | Freq: Every day | ORAL | Status: DC
  Administered 2019-02-08 – 2019-02-18 (×11): 15 mg via ORAL
  Filled 2019-02-08 (×13): qty 1

## 2019-02-08 MED ORDER — AMIODARONE HCL 100 MG PO TABS
200.0000 mg | ORAL_TABLET | Freq: Every day | ORAL | Status: DC
  Administered 2019-02-08 – 2019-02-19 (×12): 200 mg via ORAL
  Filled 2019-02-08 (×12): qty 2

## 2019-02-08 MED ORDER — SODIUM CHLORIDE 0.9 % IV SOLN
200.0000 mg | Freq: Once | INTRAVENOUS | Status: AC
  Administered 2019-02-08: 200 mg via INTRAVENOUS
  Filled 2019-02-08: qty 40

## 2019-02-08 MED ORDER — ASPIRIN EC 81 MG PO TBEC
81.0000 mg | DELAYED_RELEASE_TABLET | Freq: Every day | ORAL | Status: DC
  Administered 2019-02-08 – 2019-02-19 (×12): 81 mg via ORAL
  Filled 2019-02-08 (×12): qty 1

## 2019-02-08 MED ORDER — ONDANSETRON HCL 4 MG/2ML IJ SOLN: 4.0000 mg | Freq: Four times a day (QID) | INTRAMUSCULAR | Status: DC | PRN

## 2019-02-08 NOTE — ED Notes (Signed)
Pt soiled and wet when this RN went to apply condom cath. Pt cleaned, dirty brief and linen removed, condom cath in place.

## 2019-02-08 NOTE — ED Notes (Signed)
Oxygen decreased to 4L [place]. Oxygen saturation remains in mid 90s

## 2019-02-08 NOTE — ED Notes (Signed)
[facility] at bedside to get patient

## 2019-02-08 NOTE — ED Notes (Addendum)
Patient's wife provided with information for [facility], including phone number. Verbalized understanding. Verbal consent for transfer given

## 2019-02-08 NOTE — H&P (Signed)
[facility] NAME: [name]    MR#:  [id number]  DATE OF BIRTH:  May 30, 1934  DATE OF ADMISSION:  02/08/2019  PRIMARY CARE PHYSICIAN: Corey Skains, MD   REQUESTING/REFERRING PHYSICIAN: Marjean Donna, MD  CHIEF COMPLAINT:   Chief Complaint  Patient presents with  . Respiratory Distress    HISTORY OF PRESENT ILLNESS:  [name]  is a 84 y.o. male with a known history of atrial fibrillation, congestive heart failure, hypertension, type 2 diabetes, hyperlipidemia, PVD, prostate cancer, OSA on CPAP who presented to the ED with shortness of breath that started acutely this evening. He states he suddenly couldn't breathe. He denies any chest pain, cough, fevers, chills, orthopnea, or lower extremity edema.   In the ED, O2 sats were 80% and he was placed on CPAP. Labs were significant for creatinine 1.38, WBC 17.5, hemoglobin 10.0, BNP 839. CXR showed a right sided opacity, suspicious for pneumonia. He was admitted for further management.  PAST MEDICAL HISTORY:   Past Medical History:  Diagnosis Date  . Arthritis   . Atrial fibrillation ([place])   . Carcinoma of prostate ([place])   . CHF (congestive heart failure) ([place])   . Chronic kidney disease   . Diabetes mellitus without complication ([place])   . ED (erectile dysfunction)   . Frequent urination   . Hyperlipidemia   . Hypertension   . Moderate mitral insufficiency   . Peripheral vascular disease ([place])   . Pneumonia 04/2016  . Prostate cancer ([place])   . Sleep apnea    OSA--USE C-PAP  . Ulcer of left foot due to type 2 diabetes mellitus ([place])   . Urinary stress incontinence, male     PAST SURGICAL HISTORY:   Past Surgical History:  Procedure Laterality Date  . AMPUTATION Left 01/12/2017   Procedure: AMPUTATION BELOW KNEE;  Surgeon: Algernon Huxley, MD;  Location: [facility] ORS;  Service: General;  Laterality: Left;  . AMPUTATION Right 12/27/2018   Procedure: AMPUTATION  BELOW KNEE;  Surgeon: Algernon Huxley, MD;  Location: [facility];  Service: General;  Laterality: Right;  . APLIGRAFT PLACEMENT Left 10/15/2016   Procedure: APLIGRAFT PLACEMENT;  Surgeon: Albertine Patricia, DPM;  Location: [facility];  Service: Podiatry;  Laterality: Left;  necrotic ulcer  . CHOLECYSTECTOMY    . COLONOSCOPY WITH PROPOFOL N/A 01/02/2019   Procedure: COLONOSCOPY WITH PROPOFOL;  Surgeon: Lucilla Lame, MD;  Location: [facility] END[facility]COPY;  Service: Endoscopy;  Laterality: N/A;  . COLONOSCOPY WITH PROPOFOL N/A 01/05/2019   Procedure: COLONOSCOPY WITH PROPOFOL;  Surgeon: Lucilla Lame, MD;  Location: [facility] ENDOSCOPY;  Service: Endoscopy;  Laterality: N/A;  . ESOPHAGOGASTRODUODENOSCOPY (EGD) WITH PROPOFOL N/A 01/02/2019   Procedure: ESOPHAGOGASTRODUODENOSCOPY (EGD) WITH PROPOFOL;  Surgeon: Lucilla Lame, MD;  Location: [facility] ENDOSCOPY;  Service: Endoscopy;  Laterality: N/A;  . EYE SURGERY Bilateral    Cataract Extraction with IOL  . HIP ARTHROPLASTY Right 10/13/2018   Procedure: ARTHROPLASTY BIPOLAR HIP (HEMIARTHROPLASTY);  Surgeon: Earnestine Leys, MD;  Location: [facility];  Service: Orthopedics;  Laterality: Right;  . IRRIGATION AND DEBRIDEMENT FOOT Left 10/15/2016   Procedure: IRRIGATION AND DEBRIDEMENT FOOT-EXCISIONAL DEBRIDEMENT OF SKIN 3RD, 4TH AND 5TH TOES WITH APPLICATION OF APLIGRAFT;  Surgeon: Albertine Patricia, DPM;  Location: [facility];  Service: Podiatry;  Laterality: Left;  necrotic,gangrene  . IRRIGATION AND DEBRIDEMENT FOOT Left 12/09/2016   Procedure: IRRIGATION AND DEBRIDEMENT FOOT-MUSCLE/FASCIA, RESECTION OF FOURTH AND FIFTH METATARSAL NECROTIC BONE;  Surgeon: Albertine Patricia, DPM;  Location: [facility];  Service: Podiatry;  Laterality: Left;  . IRRIGATION AND DEBRIDEMENT FOOT Right 12/23/2018   Procedure: IRRIGATION AND DEBRIDEMENT FOOT and wound vac application;  Surgeon: Samara Deist, DPM;  Location: [facility];  Service: Podiatry;  Laterality: Right;  . LEFT HEART CATH AND CORONARY ANGIOGRAPHY  N/A 06/05/2018   Procedure: LEFT HEART CATH AND CORONARY ANGIOGRAPHY;  Surgeon: Yolonda Kida, MD;  Location: [place] [facility];  Service: Cardiovascular;  Laterality: N/A;  . LOWER EXTREMITY ANGIOGRAPHY Left 08/16/2016   Procedure: Lower Extremity Angiography;  Surgeon: Algernon Huxley, MD;  Location: [place] [facility];  Service: Cardiovascular;  Laterality: Left;  . LOWER EXTREMITY ANGIOGRAPHY Left 09/01/2016   Procedure: Lower Extremity Angiography;  Surgeon: Algernon Huxley, MD;  Location: [place] [facility];  Service: Cardiovascular;  Laterality: Left;  . LOWER EXTREMITY ANGIOGRAPHY Left 10/14/2016   Procedure: Lower Extremity Angiography;  Surgeon: Algernon Huxley, MD;  Location: [place] [facility];  Service: Cardiovascular;  Laterality: Left;  . LOWER EXTREMITY ANGIOGRAPHY Left 12/20/2016   Procedure: Lower Extremity Angiography;  Surgeon: Algernon Huxley, MD;  Location: [place] [facility];  Service: Cardiovascular;  Laterality: Left;  . LOWER EXTREMITY ANGIOGRAPHY Right 12/18/2018   Procedure: LOWER EXTREMITY ANGIOGRAPHY;  Surgeon: Algernon Huxley, MD;  Location: [place] [facility];  Service: Cardiovascular;  Laterality: Right;  . LOWER EXTREMITY INTERVENTION  09/01/2016   Procedure: Lower Extremity Intervention;  Surgeon: Algernon Huxley, MD;  Location: [place] [facility];  Service: Cardiovascular;;  . PROSTATE SURGERY     removal  . TONSILLECTOMY     as a child    SOCIAL HISTORY:   Social History   Tobacco Use  . Smoking status: Former Smoker    Packs/day: 0.25    Types: Cigarettes    Quit date: 10/14/1994    Years since quitting: 24.3  . Smokeless tobacco: Never Used  Substance Use Topics  . Alcohol use: No    Alcohol/week: 0.0 standard drinks    FAMILY HISTORY:   Family History  Problem Relation Age of Onset  . Hypertension Mother   . Diabetes Mother   . Prostate cancer Neg Hx   . Bladder Cancer Neg Hx   . Kidney disease Neg Hx     DRUG ALLERGIES:   Allergies   Allergen Reactions  . Contrast Media [Iodinated Diagnostic Agents] Shortness Of Breath    SOB  . Iohexol Shortness Of Breath     Desc: Respiratory Distress, Laryngedema, diaphoresis, Onset Date: [id number]   . Metrizamide Shortness Of Breath    SOB SOB SOB   . Latex Rash    REVIEW OF SYSTEMS:   Review of Systems  Constitutional: Negative for chills and fever.  HENT: Negative for congestion and sore throat.   Eyes: Negative for blurred vision and double vision.  Respiratory: Positive for shortness of breath. Negative for cough.   Cardiovascular: Negative for chest pain and palpitations.  Gastrointestinal: Negative for nausea and vomiting.  Genitourinary: Negative for dysuria and urgency.  Musculoskeletal: Negative for back pain and neck pain.  Neurological: Negative for dizziness and headaches.  Psychiatric/Behavioral: Negative for depression. The patient is not nervous/anxious.     MEDICATIONS AT HOME:   Prior to Admission medications   Medication Sig Start Date End Date Taking? Authorizing Provider  acetaminophen (TYLENOL) 325 MG tablet Take 650 mg by mouth every 6 (six) hours as needed.   Yes [provider]  amiodarone (PACERONE)  200 MG tablet Take 200 mg by mouth daily. 09/13/18  Yes [provider]  aspirin EC 81 MG tablet Take 1 tablet (81 mg total) by mouth daily. 12/18/18  Yes Algernon Huxley, MD  atorvastatin (LIPITOR) 80 MG tablet Take 80 mg by mouth every evening. 07/31/18  Yes [provider]  bisacodyl (DULCOLAX) 5 MG EC tablet Take 1 tablet (5 mg total) by mouth daily as needed for moderate constipation. 01/05/19  Yes [name], MD  diltiazem (CARDIZEM CD) 120 MG 24 hr capsule Take 120 mg by mouth daily. 09/28/18  Yes [provider]  ferrous sulfate 325 (65 FE) MG tablet Take 1 tablet (325 mg total) by mouth daily with breakfast. 10/18/18  Yes [name] Mandes, MD  HYDROcodone-acetaminophen (NORCO/VICODIN) 5-325 MG tablet Take 1-2 tablets by  mouth every 6 (six) hours as needed for moderate pain (pain score 4-6). 10/18/18  Yes [name] Mandes, MD  insulin glargine (LANTUS) 100 UNIT/ML injection Inject 0.15 mLs (15 Units total) into the skin at bedtime. 10/18/18  Yes [name] Mandes, MD  insulin lispro (HUMALOG) 100 UNIT/ML injection Inject 0.03 mLs (3 Units total) into the skin 3 (three) times daily with meals. Patient taking differently: Inject 3 Units into the skin 3 (three) times daily with meals. Plus 1 additional unit for every 50 BG points over 200 (max 5 additional units per dose) 11/28/17  Yes Wieting, Richard, MD  metoprolol tartrate (LOPRESSOR) 25 MG tablet Take 1 tablet (25 mg total) by mouth 3 (three) times daily. 11/28/17  Yes Wieting, Richard, MD  Multiple Vitamin (MULTIVITAMIN WITH MINERALS) TABS tablet Take 1 tablet by mouth daily. 01/05/19  Yes [name], MD  nitroGLYCERIN (NITROSTAT) 0.4 MG SL tablet Place 1 tablet (0.4 mg total) under the tongue every 5 (five) minutes as needed for chest pain. 06/09/18  Yes Hillary Bow, MD  Rivaroxaban (XARELTO) 15 MG TABS tablet Take 1 tablet (15 mg total) by mouth daily with supper. Hold Xarelto for 3 days.  Start on January 09, 2019. 01/09/19  Yes [name], MD  senna (SENOKOT) 8.6 MG TABS tablet Take 1 tablet (8.6 mg total) by mouth daily as needed for mild constipation. 01/05/19  Yes [name], MD  silver sulfADIAZINE (SILVADENE) 1 % cream Apply 1 application topically daily. 01/30/19  Yes Kris Hartmann, NP  collagenase (SANTYL) ointment Apply 1 application topically daily.    [provider]  Ensure Max Protein (ENSURE MAX PROTEIN) LIQD Take 330 mLs (11 oz total) by mouth 2 (two) times daily. 01/05/19   [name], MD  sulfamethoxazole-trimethoprim (BACTRIM) 400-80 MG tablet Take 1 tablet by mouth 2 (two) times daily. 01/30/19   Kris Hartmann, NP  vitamin C (VITAMIN C) 250 MG tablet Take 1 tablet (250 mg total) by mouth 2 (two) times daily. Patient not taking: Reported on 02/08/2019  01/05/19   [name], MD      VITAL SIGNS:  Blood pressure (!) 152/72, pulse 82, temperature 98.2 F (36.8 C), temperature source Oral, resp. rate 15, SpO2 100 %.  PHYSICAL EXAMINATION:  Physical Exam  GENERAL:  84 y.o.-year-old patient lying in the bed with no acute distress.  EYES: Pupils equal, round, reactive to light and accommodation. No scleral icterus. Extraocular muscles intact.  HEENT: Head atraumatic, normocephalic. Oropharynx and nasopharynx clear.  NECK:  Supple, no jugular venous distention. No thyroid enlargement, no tenderness.  LUNGS: +crackles throughout the right lung. No use of accessory muscles of respiration. BiPAP in place. CARDIOVASCULAR:  RRR, S1, S2 normal. No murmurs, rubs, or gallops.  ABDOMEN: Soft, nontender, nondistended. Bowel sounds present. No organomegaly or mass.  EXTREMITIES: No pedal edema, cyanosis, or clubbing. +bilateral BKA NEUROLOGIC: Cranial nerves II through XII are intact. Muscle strength 5/5 in all extremities. Sensation intact. Gait not checked.  PSYCHIATRIC: The patient is alert and oriented x 3.  SKIN: No obvious rash, lesion, or ulcer.   LABORATORY PANEL:   CBC Recent Labs  Lab 02/08/19 0556  WBC 17.5*  HGB 10.0*  HCT 31.8*  PLT 410*   ------------------------------------------------------------------------------------------------------------------  Chemistries  Recent Labs  Lab 02/08/19 0556  NA 135  K 4.2  CL 105  CO2 21*  GLUCOSE 265*  BUN 36*  CREATININE 1.38*  CALCIUM 8.1*   ------------------------------------------------------------------------------------------------------------------  Cardiac Enzymes No results for input(s): TROPONINI in the last 168 hours. ------------------------------------------------------------------------------------------------------------------  RADIOLOGY:  Dg Chest Port 1 View  Result Date: 02/08/2019 CLINICAL DATA:  Respiratory distress EXAM: PORTABLE CHEST 1 VIEW  COMPARISON:  12/21/2018 FINDINGS: Chronic cardiomegaly and interstitial prominence with mild scarring at the right base. New asymmetric hazy airspace type density on the right. No effusion or pneumothorax. Stable borderline heart size. Artifact from overlapping hardware. IMPRESSION: Hazy asymmetric right-sided opacity primarily suspicious for pneumonia. Background chronic bronchitic markings. Electronically Signed   By: Monte Fantasia M.D.   On: 02/08/2019 06:27      IMPRESSION AND PLAN:   Sepsis with acute hypoxic respiratory failure likely secondary to CAP- meeting sepsis criteria on admission with tachypnea, leukocytosis, and lactic acidosis. -COVID is pending -Continue ceftriaxone and azithromycin -Trend lactic acid -Follow-up blood cultures -Check procalcitonin -Continue BiPAP, transition to [place] as able  Acute on chronic heart failure with mid-range EF- last ECHO 05/2018 with EF 45-50% and hypokinesis of the apical myocardium. BNP is elevated, but it is lower than all previous values. He does have crackles on exam. -Received lasix 40mg  x 1 in the ED -Continue home metoprolol, aspirin -No ACE due to AKI -ECHO ordered  AKI- likely due to above -Avoid nephrotoxic agents -Recheck creatinine in the morning  Paroxysmal atrial fibrillation- in NSR here -Continue home amiodarone, diltiazem, metoprolol, and xarelto  Hypertension- BP mildly elevated in the ED -Continue home BP meds  Type 2 diabetes -Hold home lantus while NPO, can restart when patient resumes diet -SSI  Hyperlipidemia- stable -Continue home lipitor  Normocytic anemia- no active bleeding. -Monitor  OSA -CPAP qhs when off BiPAP  All the records are reviewed and case discussed with ED provider. Management plans discussed with the patient, family and they are in agreement.  CODE STATUS: Full  TOTAL TIME TAKING CARE OF THIS PATIENT: 45 minutes.    Berna Spare Guynell Kleiber M.D on 02/08/2019 at 6:57 AM  Between 7am to 6pm  - Pager - [phone]  After 6pm go to [email or web address] - Proofreader  [facility]  Office  [phone]  CC: Primary care physician; Corey Skains, MD   Note: This dictation was prepared with Dragon dictation along with smaller phrase technology. Any transcriptional errors that result from this process are unintentional.

## 2019-02-08 NOTE — Progress Notes (Signed)
Pharmacy Note - Remdesivir Dosing  O:  ALT: 19 CXR: Hazy asymmetric right-sided opacity primarily suspicious for pneumonia.  Requiring supplemental O2: 4L [place]   A/P:  Patient meets criteria for remdesivir.  Begin remdesivir 200 mg IV x 1, followed by 100 mg IV daily x 4 days  Monitor ALT, clinical progress  Peggyann Juba, PharmD, [place] [phone] 02/08/2019 7:10 PM

## 2019-02-08 NOTE — H&P (Signed)
[facility] History and Physical  [name] [id number] DOB: February 28, 1935 DOA: 02/08/2019   PCP: Corey Skains, MD  Specialists: Vascular surgeon: Dr. Lucky Cowboy  Chief Complaint: Shortness of breath  HPI: [name] is a 84 y.o. male with a past medical history of chronic systolic CHF with a EF of 45 to 50%, atrial fibrillation on anticoagulation, insulin-dependent diabetes mellitus, essential hypertension, peripheral vascular disease, recent right below-knee amputation, previous left below-knee amputation, history of prostate cancer, history of sleep apnea on CPAP who recently came home from a skilled nursing facility a few days ago.  He was at the rehab facility after he underwent right below-knee amputation last month.  He was doing well and then the day prior to admission patient started developing shortness of breath.  Symptoms worsened during the nighttime so he had his wife called EMS.  He was taken to the emergency department at [facility].  Patient denies any chest pain.  No nausea vomiting.  No diarrhea.  He actually reports constipation and states that he has not had a bowel movement in several days.  Denies any abdominal pain.  He states that he is feeling better compared to how he was last night.  No history of orthopnea or PND.  In the emergency department patient was found to have right lung pneumonia.  He was noted to be hypoxic.  Initially placed on BiPAP.  Patient was hospitalized.  Subsequently the COVID-19 test came back positive.  He was subsequently sent over to the [facility] for further management.  Home Medications: Prior to Admission medications   Medication Sig Start Date End Date Taking? Authorizing Provider  acetaminophen (TYLENOL) 325 MG tablet Take 650 mg by mouth every 6 (six) hours as needed.    [provider]  amiodarone (PACERONE) 200 MG tablet Take 200 mg by mouth daily. 09/13/18   [provider]  aspirin EC 81  MG tablet Take 1 tablet (81 mg total) by mouth daily. 12/18/18   Algernon Huxley, MD  atorvastatin (LIPITOR) 80 MG tablet Take 80 mg by mouth every evening. 07/31/18   [provider]  bisacodyl (DULCOLAX) 5 MG EC tablet Take 1 tablet (5 mg total) by mouth daily as needed for moderate constipation. 01/05/19   [name], MD  collagenase (SANTYL) ointment Apply 1 application topically daily.    [provider]  diltiazem (CARDIZEM CD) 120 MG 24 hr capsule Take 120 mg by mouth daily. 09/28/18   [provider]  Ensure Max Protein (ENSURE MAX PROTEIN) LIQD Take 330 mLs (11 oz total) by mouth 2 (two) times daily. 01/05/19   [name], MD  ferrous sulfate 325 (65 FE) MG tablet Take 1 tablet (325 mg total) by mouth daily with breakfast. 10/18/18   [name] Mandes, MD  HYDROcodone-acetaminophen (NORCO/VICODIN) 5-325 MG tablet Take 1-2 tablets by mouth every 6 (six) hours as needed for moderate pain (pain score 4-6). 10/18/18   [name] Mandes, MD  insulin glargine (LANTUS) 100 UNIT/ML injection Inject 0.15 mLs (15 Units total) into the skin at bedtime. 10/18/18   [name] Mandes, MD  insulin lispro (HUMALOG) 100 UNIT/ML injection Inject 0.03 mLs (3 Units total) into the skin 3 (three) times daily with meals. Patient taking differently: Inject 3 Units into the skin 3 (three) times daily with meals. Plus 1 additional unit for every 50 BG points over 200 (max 5 additional units per dose) 11/28/17   Loletha Grayer, MD  metoprolol tartrate (LOPRESSOR) 25  MG tablet Take 1 tablet (25 mg total) by mouth 3 (three) times daily. 11/28/17   Loletha Grayer, MD  Multiple Vitamin (MULTIVITAMIN WITH MINERALS) TABS tablet Take 1 tablet by mouth daily. 01/05/19   [name], MD  nitroGLYCERIN (NITROSTAT) 0.4 MG SL tablet Place 1 tablet (0.4 mg total) under the tongue every 5 (five) minutes as needed for chest pain. 06/09/18   Hillary Bow, MD  Rivaroxaban (XARELTO) 15 MG TABS tablet Take 1 tablet (15 mg total) by mouth  daily with supper. Hold Xarelto for 3 days.  Start on January 09, 2019. 01/09/19   [name], MD  senna (SENOKOT) 8.6 MG TABS tablet Take 1 tablet (8.6 mg total) by mouth daily as needed for mild constipation. 01/05/19   [name], MD  silver sulfADIAZINE (SILVADENE) 1 % cream Apply 1 application topically daily. 01/30/19   Kris Hartmann, NP  sulfamethoxazole-trimethoprim (BACTRIM) 400-80 MG tablet Take 1 tablet by mouth 2 (two) times daily. 01/30/19   Kris Hartmann, NP  vitamin C (VITAMIN C) 250 MG tablet Take 1 tablet (250 mg total) by mouth 2 (two) times daily. Patient not taking: Reported on 02/08/2019 01/05/19   [name], MD    Allergies:  Allergies  Allergen Reactions  . Contrast Media [Iodinated Diagnostic Agents] Shortness Of Breath    SOB  . Iohexol Shortness Of Breath     Desc: Respiratory Distress, Laryngedema, diaphoresis, Onset Date: [id number]   . Metrizamide Shortness Of Breath    SOB SOB SOB   . Latex Rash    Past Medical History: Past Medical History:  Diagnosis Date  . Arthritis   . Atrial fibrillation ([place])   . Carcinoma of prostate ([place])   . CHF (congestive heart failure) ([place])   . Chronic kidney disease   . Diabetes mellitus without complication ([place])   . ED (erectile dysfunction)   . Frequent urination   . Hyperlipidemia   . Hypertension   . Moderate mitral insufficiency   . Peripheral vascular disease ([place])   . Pneumonia 04/2016  . Prostate cancer ([place])   . Sleep apnea    OSA--USE C-PAP  . Ulcer of left foot due to type 2 diabetes mellitus ([place])   . Urinary stress incontinence, male     Past Surgical History:  Procedure Laterality Date  . AMPUTATION Left 01/12/2017   Procedure: AMPUTATION BELOW KNEE;  Surgeon: Algernon Huxley, MD;  Location: [facility] ORS;  Service: General;  Laterality: Left;  . AMPUTATION Right 12/27/2018   Procedure: AMPUTATION BELOW KNEE;  Surgeon: Algernon Huxley, MD;  Location: [facility] ORS;  Service: General;  Laterality: Right;  .  APLIGRAFT PLACEMENT Left 10/15/2016   Procedure: APLIGRAFT PLACEMENT;  Surgeon: Albertine Patricia, DPM;  Location: [facility] ORS;  Service: Podiatry;  Laterality: Left;  necrotic ulcer  . CHOLECYSTECTOMY    . COLONOSCOPY WITH PROPOFOL N/A 01/02/2019   Procedure: COLONOSCOPY WITH PROPOFOL;  Surgeon: Lucilla Lame, MD;  Location: [facility] ENDOSCOPY;  Service: Endoscopy;  Laterality: N/A;  . COLONOSCOPY WITH PROPOFOL N/A 01/05/2019   Procedure: COLONOSCOPY WITH PROPOFOL;  Surgeon: Lucilla Lame, MD;  Location: [facility] ENDOSCOPY;  Service: Endoscopy;  Laterality: N/A;  . ESOPHAGOGASTRODUODENOSCOPY (EGD) WITH PROPOFOL N/A 01/02/2019   Procedure: ESOPHAGOGASTRODUODENOSCOPY (EGD) WITH PROPOFOL;  Surgeon: Lucilla Lame, MD;  Location: [facility] ENDOSCOPY;  Service: Endoscopy;  Laterality: N/A;  . EYE SURGERY Bilateral    Cataract Extraction with IOL  . HIP ARTHROPLASTY Right 10/13/2018   Procedure: ARTHROPLASTY BIPOLAR HIP (HEMIARTHROPLASTY);  Surgeon: Earnestine Leys, MD;  Location: [facility] ORS;  Service: Orthopedics;  Laterality: Right;  . IRRIGATION AND DEBRIDEMENT FOOT Left 10/15/2016   Procedure: IRRIGATION AND DEBRIDEMENT FOOT-EXCISIONAL DEBRIDEMENT OF SKIN 3RD, 4TH AND 5TH TOES WITH APPLICATION OF APLIGRAFT;  Surgeon: Albertine Patricia, DPM;  Location: [facility] ORS;  Service: Podiatry;  Laterality: Left;  necrotic,gangrene  . IRRIGATION AND DEBRIDEMENT FOOT Left 12/09/2016   Procedure: IRRIGATION AND DEBRIDEMENT FOOT-MUSCLE/FASCIA, RESECTION OF FOURTH AND FIFTH METATARSAL NECROTIC BONE;  Surgeon: Albertine Patricia, DPM;  Location: [facility] ORS;  Service: Podiatry;  Laterality: Left;  . IRRIGATION AND DEBRIDEMENT FOOT Right 12/23/2018   Procedure: IRRIGATION AND DEBRIDEMENT FOOT and wound vac application;  Surgeon: Samara Deist, DPM;  Location: [facility] ORS;  Service: Podiatry;  Laterality: Right;  . LEFT HEART CATH AND CORONARY ANGIOGRAPHY N/A 06/05/2018   Procedure: LEFT HEART CATH AND CORONARY ANGIOGRAPHY;  Surgeon: Yolonda Kida, MD;   Location: [place] [facility];  Service: Cardiovascular;  Laterality: N/A;  . LOWER EXTREMITY ANGIOGRAPHY Left 08/16/2016   Procedure: Lower Extremity Angiography;  Surgeon: Algernon Huxley, MD;  Location: [place] [facility];  Service: Cardiovascular;  Laterality: Left;  . LOWER EXTREMITY ANGIOGRAPHY Left 09/01/2016   Procedure: Lower Extremity Angiography;  Surgeon: Algernon Huxley, MD;  Location: [place] [facility];  Service: Cardiovascular;  Laterality: Left;  . LOWER EXTREMITY ANGIOGRAPHY Left 10/14/2016   Procedure: Lower Extremity Angiography;  Surgeon: Algernon Huxley, MD;  Location: [place] [facility];  Service: Cardiovascular;  Laterality: Left;  . LOWER EXTREMITY ANGIOGRAPHY Left 12/20/2016   Procedure: Lower Extremity Angiography;  Surgeon: Algernon Huxley, MD;  Location: [place] [facility];  Service: Cardiovascular;  Laterality: Left;  . LOWER EXTREMITY ANGIOGRAPHY Right 12/18/2018   Procedure: LOWER EXTREMITY ANGIOGRAPHY;  Surgeon: Algernon Huxley, MD;  Location: [place] [facility];  Service: Cardiovascular;  Laterality: Right;  . LOWER EXTREMITY INTERVENTION  09/01/2016   Procedure: Lower Extremity Intervention;  Surgeon: Algernon Huxley, MD;  Location: [place] [facility];  Service: Cardiovascular;;  . PROSTATE SURGERY     removal  . TONSILLECTOMY     as a child    Social History: Lives with his wife.  Denies smoking alcohol use or illicit drug use.  Uses a wheelchair to get around.   Family History:  Family History  Problem Relation Age of Onset  . Hypertension Mother   . Diabetes Mother   . Prostate cancer Neg Hx   . Bladder Cancer Neg Hx   . Kidney disease Neg Hx      Review of Systems - History obtained from the patient General ROS: positive for  - fatigue Psychological ROS: negative Ophthalmic ROS: negative ENT ROS: negative Allergy and Immunology ROS: negative Hematological and Lymphatic ROS: negative Endocrine ROS: negative Respiratory ROS: As in HPI Cardiovascular  ROS: As in HPI Gastrointestinal ROS: positive for - constipation Genito-Urinary ROS: no dysuria, trouble voiding, or hematuria Musculoskeletal ROS: negative Neurological ROS: no TIA or stroke symptoms Dermatological ROS: negative  Physical Examination  Vitals:   02/08/19 1548  BP: (!) 145/103  Pulse: 67  Resp: 18  Temp: 98.4 F (36.9 C)  TempSrc: Oral  SpO2: 100%    BP (!) 145/103 (BP Location: Left Arm)   Pulse 67   Temp 98.4 F (36.9 C) (Oral)   Resp 18   SpO2 100%   General appearance: alert, cooperative, appears stated age, distracted and no distress Head: Normocephalic, without obvious abnormality, atraumatic Eyes:  conjunctivae/corneas clear. PERRL, EOM's intact. Throat: lips, mucosa, and tongue normal; teeth and gums normal Neck: no adenopathy, no carotid bruit, no JVD, supple, symmetrical, trachea midline and thyroid not enlarged, symmetric, no tenderness/mass/nodules Resp: Crackles heard right lung more than left lung.  No wheezing or rhonchi.  Mildly tachypneic.  No use of accessory muscles.  Coarse breath sounds. Cardio: regular rate and rhythm, S1, S2 normal, no murmur, click, rub or gallop GI: soft, non-tender; bowel sounds normal; no masses,  no organomegaly Extremities: He is status post bilateral BKA.  Has a dressing over the right stump. Pulses: Good radial pulses bilaterally Skin: Removal of dressing over the right BKA revealed a wound with black eschar, minimal area of erythema.  Few staples noted.  No active drainage.  No foul smell appreciated.  Tender to palpation. Lymph nodes: Cervical, supraclavicular, and axillary nodes normal. Neurologic: Alert.  Oriented to time place person.  No obvious focal neurological deficits.   Labs on Admission: I have personally reviewed following labs and imaging studies  CBC: Recent Labs  Lab 02/08/19 0556  WBC 17.5*  NEUTROABS 15.0*  HGB 10.0*  HCT 31.8*  MCV 88.6  PLT 123XX123*   Basic Metabolic Panel: Recent  Labs  Lab 02/08/19 0556  NA 135  K 4.2  CL 105  CO2 21*  GLUCOSE 265*  BUN 36*  CREATININE 1.38*  CALCIUM 8.1*   GFR: Estimated Creatinine Clearance: 39.3 mL/min (A) (by C-G formula based on SCr of 1.38 mg/dL (H)).  Coagulation Profile: Recent Labs  Lab 02/08/19 0557  INR 3.0*     Radiological Exams on Admission: Dg Chest Port 1 View  Result Date: 02/08/2019 CLINICAL DATA:  Respiratory distress EXAM: PORTABLE CHEST 1 VIEW COMPARISON:  12/21/2018 FINDINGS: Chronic cardiomegaly and interstitial prominence with mild scarring at the right base. New asymmetric hazy airspace type density on the right. No effusion or pneumothorax. Stable borderline heart size. Artifact from overlapping hardware. IMPRESSION: Hazy asymmetric right-sided opacity primarily suspicious for pneumonia. Background chronic bronchitic markings. Electronically Signed   By: Monte Fantasia M.D.   On: 02/08/2019 06:27    My interpretation of Electrocardiogram: Sinus rhythm in the 80s.  Normal axis.  QT prolongation noted.  ST segment depression noted in V5 V6 which has been seen previously but perhaps a little more pronounced on this EKG.   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  Found to have a pneumonia in the right lung.  He was noted to be hypoxic.  He has acute respiratory failure with hypoxia.  He is positive for  COVID-19.  He does have leukocytosis.  He was given ceftriaxone and azithromycin at [facility] prior to transfer.  Pneumonia most likely is due to COVID-19.  Cannot rule out a superimposed bacterial infection at this time.  Plan:  1. Pneumonia secondary to COVID-19 with acute respiratory failure with hypoxia/sepsis: We will check a procalcitonin level.  Patient was given antibiotics earlier today.  If procalcitonin level is elevated we will continue with systemic antibiotics.  Patient at this time meets criteria for Remdesivir which will be ordered.  Steroids will be initiated.  Check inflammatory markers.  Incentive spirometry.  He is currently on 4 L saturating in the late 90s.  Oxygen can be titrated down to kees saturations greater than 90%.  Earlier in the [facility] emergency department patient was on BiPAP.  He appears to have improved to some extent.  Usually does not use oxygen at home.  Lactic acid level was noted to be elevated.  There is evidence for sepsis based on that plus the elevated WBC and tachypnea.  Follow up on blood cultures.  2. Chronic systolic CHF: Echocardiogram from earlier this year showed a EF of 45 to 50%.  BNP was noted to be elevated at the other facility.  Patient was given Lasix earlier today.  Currently appears to be euvolemic.  We will hold off on further doses of Lasix for now.  3.  Acute renal failure: Baseline creatinine around 1.1.  Here it is noted to be 1.38.  Monitor urine output.  Recheck labs tomorrow.  4. Recent right BKA with stump wound: The surgery was done by vascular surgeon at [facility], Dr. Lucky Cowboy.  Seen in their clinic on September 8 and a few staples were removed at that time.  Patient was prescribed Bactrim to be taken for 1 to 2 weeks more.  Silver sulfadiazine cream was also prescribed.  Wound care nurse will be consulted.  Continue with Bactrim for now.  Patient was supposed to follow-up with vascular surgeon tomorrow but he will not be able to make that appointment.   He will need to see them after discharge from this facility.  5.  Essential hypertension: Continue with his home medications.  6.  History of paroxysmal atrial fibrillation: Currently in sinus rhythm.  Continue with rate control medications.  He is also anticoagulated with rivaroxaban.  EKG showed ST depression in V5 V6 however this was seen previously as well.  Patient without any chest pain currently.  Continue to monitor on telemetry.  7.  Diabetes mellitus type 2, uncontrolled with hyperglycemia: HbA1c 8.9 in July.  Place him on sliding scale coverage.  Continue with his Lantus.  Anticipate some worsening in his glycemic control steroids.  8.  Hyperlipidemia: Continue statin  9.  Normocytic anemia: No active bleeding noted.  Baseline hemoglobin between 8 and 9.  10.  History of obstructive sleep apnea: Utilize oxygen.  Cannot use CPAP in this facility.   DVT Prophylaxis: On rivaroxaban Code Status: Full code Family Communication: We will discuss with his wife Disposition: Hopefully home when ready for discharge Consults called: None Admission Status: Inpatient  Severity of Illness: The appropriate patient status for this patient is INPATIENT. Inpatient status is judged to be reasonable and necessary in order to provide the required intensity of service to ensure the patient's safety. The patient's presenting symptoms, physical exam findings, and initial radiographic and laboratory data in the context of their chronic comorbidities is felt to place them at high risk for further clinical deterioration. Furthermore, it is not anticipated that the patient will be medically stable for discharge from the hospital within 2 midnights of admission. The following factors support the patient status of inpatient.   " The patient's presenting symptoms include shortness of breath. " The worrisome physical exam findings include hypoxia. " The initial radiographic and laboratory data  are worrisome  because of pneumonia. " The chronic co-morbidities include congestive heart failure.   * I certify that at the point of admission it is my clinical judgment that the patient will require inpatient hospital care spanning beyond 2 midnights from the point of admission due to high intensity of service, high risk for further deterioration and high frequency of surveillance required.*    Further management decisions will depend on results of further testing and patient's response to treatment.   Naw Lasala [employer]  [facility] operational officer on [email or web address]  02/08/2019, 4:38 PM

## 2019-02-08 NOTE — ED Provider Notes (Signed)
[facility] Emergency Department Provider Note _   First MD Initiated Contact with Patient 02/08/19 [phone]     (approximate)  I have reviewed the triage vital signs and the nursing notes.   HISTORY  Chief Complaint Respiratory Distress    HPI [name] is a 84 y.o. male with below list of previous medical conditions including chronic kidney disease diabetes hyperlipidemia, hypertension and congestive heart failure presents to the emergency department via EMS with history of acute onset of respiratory distress tonight.  EMS states on their arrival patient with apparent respiratory difficulty with oxygen saturation of 80% on room air.  CPAP was applied, 1 inch of Nitropaste applied        Past Medical History:  Diagnosis Date  . Arthritis   . Atrial fibrillation ([place])   . Carcinoma of prostate ([place])   . CHF (congestive heart failure) ([place])   . Chronic kidney disease   . Diabetes mellitus without complication ([place])   . ED (erectile dysfunction)   . Frequent urination   . Hyperlipidemia   . Hypertension   . Moderate mitral insufficiency   . Peripheral vascular disease ([place])   . Pneumonia 04/2016  . Prostate cancer ([place])   . Sleep apnea    OSA--USE C-PAP  . Ulcer of left foot due to type 2 diabetes mellitus ([place])   . Urinary stress incontinence, male     Patient Active Problem List   Diagnosis Date Noted  . Below-knee amputation of right lower extremity ([place]) 01/15/2019  . Polyp of descending colon   . Iron deficiency anemia due to chronic blood loss   . Sacral decubitus ulcer, stage II ([place]) 12/26/2018  . Acute respiratory failure ([place]) 12/20/2018  . Femur fracture ([place]) 10/13/2018  . Chest pain 06/03/2018  . NSTEMI (non-ST elevated myocardial infarction) ([place]) 01/16/2018  . Acute CHF (congestive heart failure) ([place]) 11/23/2017  . Atherosclerosis of native arteries of the extremities with ulceration ([place]) 11/18/2017  . Atrial  fibrillation with rapid ventricular response ([place]) 10/15/2017  . BKA stump complication ([place]) [id number]/9628  . 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 S/P BKA (below knee amputation) unilateral, left ([place]) 02/01/2017  . Chronic diastolic heart failure ([place]) 01/21/2017  . Pressure injury of skin 01/18/2017  . Atherosclerosis of artery of extremity with gangrene ([place]) 01/05/2017  . Diabetic foot ulcer ([place]) 12/29/2016  . Ataxia 10/21/2016  . History of femoral angiogram 09/06/2016  . Left leg pain 09/06/2016  . Leukocytosis 09/06/2016  . Generalized weakness 09/06/2016  . Atrial fibrillation, chronic 08/30/2016  . Pneumonia 05/19/2016  . Hyperlipidemia 04/20/2016  . Back pain 04/19/2016  . Type 2 diabetes mellitus treated with insulin ([place]) 04/19/2016  . Essential hypertension 04/19/2016  . PAD (peripheral artery disease) ([place]) 04/19/2016  . Erectile dysfunction following radical prostatectomy 06/25/2015  . History of prostate cancer 12/23/2014  . Incontinence 12/23/2014    Past Surgical History:  Procedure Laterality Date  . AMPUTATION Left 01/12/2017   Procedure: AMPUTATION BELOW KNEE;  Surgeon: Algernon Huxley, MD;  Location: [facility] ORS;  Service: General;  Laterality: Left;  . AMPUTATION Right 12/27/2018   Procedure: AMPUTATION BELOW KNEE;  Surgeon: Algernon Huxley, MD;  Location: [facility] ORS;  Service: General;  Laterality: Right;  . APLIGRAFT PLACEMENT Left 10/15/2016   Procedure: APLIGRAFT PLACEMENT;  Surgeon: Albertine Patricia, DPM;  Location: [facility] ORS;  Service: Podiatry;  Laterality: Left;  necrotic ulcer  . CHOLECYSTECTOMY    .  COLONOSCOPY WITH PROPOFOL N/A 01/02/2019   Procedure: COLONOSCOPY WITH PROPOFOL;  Surgeon: Lucilla Lame, MD;  Location: [facility] ENDOSCOPY;  Service: Endoscopy;  Laterality: N/A;  . COLONOSCOPY WITH PROPOFOL N/A 01/05/2019   Procedure: COLONOSCOPY WITH PROPOFOL;  Surgeon: Lucilla Lame, MD;  Location: [facility] ENDOSCOPY;  Service: Endoscopy;   Laterality: N/A;  . ESOPHAGOGASTRODUODENOSCOPY (EGD) WITH PROPOFOL N/A 01/02/2019   Procedure: ESOPHAGOGASTRODUODENOSCOPY (EGD) WITH PROPOFOL;  Surgeon: Lucilla Lame, MD;  Location: [facility] ENDOSCOPY;  Service: Endoscopy;  Laterality: N/A;  . EYE SURGERY Bilateral    Cataract Extraction with IOL  . HIP ARTHROPLASTY Right 10/13/2018   Procedure: ARTHROPLASTY BIPOLAR HIP (HEMIARTHROPLASTY);  Surgeon: Earnestine Leys, MD;  Location: [facility] ORS;  Service: Orthopedics;  Laterality: Right;  . IRRIGATION AND DEBRIDEMENT FOOT Left 10/15/2016   Procedure: IRRIGATION AND DEBRIDEMENT FOOT-EXCISIONAL DEBRIDEMENT OF SKIN 3RD, 4TH AND 5TH TOES WITH APPLICATION OF APLIGRAFT;  Surgeon: Albertine Patricia, DPM;  Location: [facility] ORS;  Service: Podiatry;  Laterality: Left;  necrotic,gangrene  . IRRIGATION AND DEBRIDEMENT FOOT Left 12/09/2016   Procedure: IRRIGATION AND DEBRIDEMENT FOOT-MUSCLE/FASCIA, RESECTION OF FOURTH AND FIFTH METATARSAL NECROTIC BONE;  Surgeon: Albertine Patricia, DPM;  Location: [facility] ORS;  Service: Podiatry;  Laterality: Left;  . IRRIGATION AND DEBRIDEMENT FOOT Right 12/23/2018   Procedure: IRRIGATION AND DEBRIDEMENT FOOT and wound vac application;  Surgeon: Samara Deist, DPM;  Location: [facility] ORS;  Service: Podiatry;  Laterality: Right;  . LEFT HEART CATH AND CORONARY ANGIOGRAPHY N/A 06/05/2018   Procedure: LEFT HEART CATH AND CORONARY ANGIOGRAPHY;  Surgeon: Yolonda Kida, MD;  Location: [place] CV LAB;  Service: Cardiovascular;  Laterality: N/A;  . LOWER EXTREMITY ANGIOGRAPHY Left 08/16/2016   Procedure: Lower Extremity Angiography;  Surgeon: Algernon Huxley, MD;  Location: [place] [facility] LAB;  Service: Cardiovascular;  Laterality: Left;  . LOWER EXTREMITY ANGIOGRAPHY Left 09/01/2016   Procedure: Lower Extremity Angiography;  Surgeon: Algernon Huxley, MD;  Location: [place] [facility];  Service: Cardiovascular;  Laterality: Left;  . LOWER EXTREMITY ANGIOGRAPHY Left 10/14/2016   Procedure: Lower Extremity  Angiography;  Surgeon: Algernon Huxley, MD;  Location: [place] [facility] LAB;  Service: Cardiovascular;  Laterality: Left;  . LOWER EXTREMITY ANGIOGRAPHY Left 12/20/2016   Procedure: Lower Extremity Angiography;  Surgeon: Algernon Huxley, MD;  Location: [place] CV LAB;  Service: Cardiovascular;  Laterality: Left;  . LOWER EXTREMITY ANGIOGRAPHY Right 12/18/2018   Procedure: LOWER EXTREMITY ANGIOGRAPHY;  Surgeon: Algernon Huxley, MD;  Location: [place] [facility];  Service: Cardiovascular;  Laterality: Right;  . LOWER EXTREMITY INTERVENTION  09/01/2016   Procedure: Lower Extremity Intervention;  Surgeon: Algernon Huxley, MD;  Location: [place] [facility];  Service: Cardiovascular;;  . PROSTATE SURGERY     removal  . TONSILLECTOMY     as a child    Prior to Admission medications   Medication Sig Start Date End Date Taking? Authorizing Provider  acetaminophen (TYLENOL) 325 MG tablet Take 650 mg by mouth every 6 (six) hours as needed.   Yes [provider]  amiodarone (PACERONE) 200 MG tablet Take 200 mg by mouth daily. 09/13/18  Yes [provider]  aspirin EC 81 MG tablet Take 1 tablet (81 mg total) by mouth daily. 12/18/18  Yes Algernon Huxley, MD  atorvastatin (LIPITOR) 80 MG tablet Take 80 mg by mouth every evening. 07/31/18  Yes [provider]  bisacodyl (DULCOLAX) 5 MG EC tablet Take 1 tablet (5 mg total) by mouth daily as needed for  moderate constipation. 01/05/19  Yes [name], MD  diltiazem (CARDIZEM CD) 120 MG 24 hr capsule Take 120 mg by mouth daily. 09/28/18  Yes [provider]  ferrous sulfate 325 (65 FE) MG tablet Take 1 tablet (325 mg total) by mouth daily with breakfast. 10/18/18  Yes [name] Mandes, MD  HYDROcodone-acetaminophen (NORCO/VICODIN) 5-325 MG tablet Take 1-2 tablets by mouth every 6 (six) hours as needed for moderate pain (pain score 4-6). 10/18/18  Yes [name] Mandes, MD  insulin glargine (LANTUS) 100 UNIT/ML injection Inject 0.15 mLs (15 Units total)  into the skin at bedtime. 10/18/18  Yes [name] Mandes, MD  insulin lispro (HUMALOG) 100 UNIT/ML injection Inject 0.03 mLs (3 Units total) into the skin 3 (three) times daily with meals. Patient taking differently: Inject 3 Units into the skin 3 (three) times daily with meals. Plus 1 additional unit for every 50 BG points over 200 (max 5 additional units per dose) 11/28/17  Yes Wieting, Richard, MD  metoprolol tartrate (LOPRESSOR) 25 MG tablet Take 1 tablet (25 mg total) by mouth 3 (three) times daily. 11/28/17  Yes Wieting, Richard, MD  Multiple Vitamin (MULTIVITAMIN WITH MINERALS) TABS tablet Take 1 tablet by mouth daily. 01/05/19  Yes [name], MD  nitroGLYCERIN (NITROSTAT) 0.4 MG SL tablet Place 1 tablet (0.4 mg total) under the tongue every 5 (five) minutes as needed for chest pain. 06/09/18  Yes Hillary Bow, MD  Rivaroxaban (XARELTO) 15 MG TABS tablet Take 1 tablet (15 mg total) by mouth daily with supper. Hold Xarelto for 3 days.  Start on January 09, 2019. 01/09/19  Yes [name], MD  senna (SENOKOT) 8.6 MG TABS tablet Take 1 tablet (8.6 mg total) by mouth daily as needed for mild constipation. 01/05/19  Yes [name], MD  silver sulfADIAZINE (SILVADENE) 1 % cream Apply 1 application topically daily. 01/30/19  Yes Kris Hartmann, NP  collagenase (SANTYL) ointment Apply 1 application topically daily.    [provider]  Ensure Max Protein (ENSURE MAX PROTEIN) LIQD Take 330 mLs (11 oz total) by mouth 2 (two) times daily. 01/05/19   [name], MD  sulfamethoxazole-trimethoprim (BACTRIM) 400-80 MG tablet Take 1 tablet by mouth 2 (two) times daily. 01/30/19   Kris Hartmann, NP  vitamin C (VITAMIN C) 250 MG tablet Take 1 tablet (250 mg total) by mouth 2 (two) times daily. Patient not taking: Reported on 02/08/2019 01/05/19   [name], MD    Allergies Contrast media [iodinated diagnostic agents], Iohexol, Metrizamide, and Latex  Family History  Problem Relation Age of Onset  . Hypertension  Mother   . Diabetes Mother   . Prostate cancer Neg Hx   . Bladder Cancer Neg Hx   . Kidney disease Neg Hx     Social History Social History   Tobacco Use  . Smoking status: Former Smoker    Packs/day: 0.25    Types: Cigarettes    Quit date: 10/14/1994    Years since quitting: 24.3  . Smokeless tobacco: Never Used  Substance Use Topics  . Alcohol use: No    Alcohol/week: 0.0 standard drinks  . Drug use: No    Review of Systems Constitutional: No fever/chills Eyes: No visual changes. ENT: No sore throat. Cardiovascular: Denies chest pain. Respiratory: Positive for dyspnea Gastrointestinal: No abdominal pain.  No nausea, no vomiting.  No diarrhea.  No constipation. Genitourinary: Negative for dysuria. Musculoskeletal: Negative for neck pain.  Negative for back pain. Integumentary: Negative for rash. Neurological:  Negative for headaches, focal weakness or numbness.  ____________________________________________   PHYSICAL EXAM:  VITAL SIGNS: ED Triage Vitals [02/08/19 0551]  Enc Vitals Group     BP (!) 152/70     Pulse Rate 87     Resp (!) 36     Temp 98.2 F (36.8 C)     Temp Source Oral     SpO2 (!) 75 %     Weight      Height      Head Circumference      Peak Flow      Pain Score      Pain Loc      Pain Edu?      Excl. in [place]?     Constitutional: Alert and oriented.  Apparent respiratory distress Eyes: Conjunctivae are normal.  Mouth/Throat: Mucous membranes are moist. Neck: No stridor.  No meningeal signs.   Cardiovascular: Normal rate, regular rhythm. Good peripheral circulation. Grossly normal heart sounds. Respiratory: Tachypnea, positive accessory respiratory muscle use, diffuse rhonchi and bibasilar rales Gastrointestinal: Soft and nontender. No distention.  Musculoskeletal: No lower extremity tenderness nor edema. No gross deformities of extremities. Neurologic:  Normal speech and language. No gross focal neurologic deficits are appreciated.   Skin:  Skin is warm, dry and intact. Psychiatric: Mood and affect are normal. Speech and behavior are normal.  ____________________________________________   LABS (all labs ordered are listed, but only abnormal results are displayed)  Labs Reviewed  CBC WITH DIFFERENTIAL/PLATELET - Abnormal; Notable for the following components:      Result Value   WBC 17.5 (*)    RBC 3.59 (*)    Hemoglobin 10.0 (*)    HCT 31.8 (*)    Platelets 410 (*)    Neutro Abs 15.0 (*)    Eosinophils Absolute 0.6 (*)    Abs Immature Granulocytes 0.13 (*)    All other components within normal limits  BASIC METABOLIC PANEL - Abnormal; Notable for the following components:   CO2 21 (*)    Glucose, Bld 265 (*)    BUN 36 (*)    Creatinine, Ser 1.38 (*)    Calcium 8.1 (*)    GFR calc non Af Amer 48 (*)    GFR calc Af Amer 55 (*)    All other components within normal limits  PROTIME-INR - Abnormal; Notable for the following components:   Prothrombin Time 30.5 (*)    INR 3.0 (*)    All other components within normal limits  SARS CORONAVIRUS 2 (HOSPITAL ORDER, [facility])  CULTURE, BLOOD (ROUTINE X 2)  CULTURE, BLOOD (ROUTINE X 2)  APTT  BRAIN NATRIURETIC PEPTIDE  LACTIC ACID, PLASMA  LACTIC ACID, PLASMA   ____________________________________________  EKG  ED ECG REPORT I, Milroy N Tamaira Ciriello, the attending physician, personally viewed and interpreted this ECG.   Date: 02/08/2019  EKG Time: 5:52 AM  Rate: 87  Rhythm: Normal sinus rhythm  Axis: Normal  Intervals: Normal  ST&T Change: None ____________________________________________  RADIOLOGY I, [place] N Trenia Tennyson, personally viewed and evaluated these images (plain radiographs) as part of my medical decision making, as well as reviewing the written report by the radiologist.  ED MD interpretation: Hazy asymmetry right-sided opacity primarily suspicious for pneumonia per radiologist on chest x-ray  interpretation.  Official radiology report(s): Dg Chest Port 1 View  Result Date: 02/08/2019 CLINICAL DATA:  Respiratory distress EXAM: PORTABLE CHEST 1 VIEW COMPARISON:  12/21/2018 FINDINGS: Chronic cardiomegaly and interstitial prominence with  mild scarring at the right base. New asymmetric hazy airspace type density on the right. No effusion or pneumothorax. Stable borderline heart size. Artifact from overlapping hardware. IMPRESSION: Hazy asymmetric right-sided opacity primarily suspicious for pneumonia. Background chronic bronchitic markings. Electronically Signed   By: Monte Fantasia M.D.   On: 02/08/2019 06:27    ____________________________________________   PROCEDURES    .Critical Care Performed by: Gregor Hams, MD Authorized by: Gregor Hams, MD   Critical care provider statement:    Critical care time (minutes):  45   Critical care time was exclusive of:  Separately billable procedures and treating other patients   Critical care was necessary to treat or prevent imminent or life-threatening deterioration of the following conditions:  Sepsis and respiratory failure   Critical care was time spent personally by me on the following activities:  Development of treatment plan with patient or surrogate, discussions with consultants, evaluation of patient's response to treatment, examination of patient, obtaining history from patient or surrogate, ordering and performing treatments and interventions, ordering and review of laboratory studies, ordering and review of radiographic studies, pulse oximetry, re-evaluation of patient's condition and review of old charts     ____________________________________________   INITIAL IMPRESSION / MDM / [place] / ED COURSE  As part of my medical decision making, I reviewed the following data within the electronic MEDICAL RECORD NUMBER 84 year old male presenting with above-stated history and physical exam secondary to  respiratory distress with concern for possible pneumonia, CHF exacerbation, pulmonary emboli COVID-19.  Chest x-ray consistent with right lung pneumonia per radiologist.  PatienT met sepsis criteria on arrival in such sepsis protocol was initiated with patient receiving appropriate IV antibiotic therapy.  I spoke with the patient's family and informed him of all clinical findings thus far. ____________________________________________  FINAL CLINICAL IMPRESSION(S) / ED DIAGNOSES  Final diagnoses:  Sepsis, due to unspecified organism, unspecified whether acute organ dysfunction present [facility])  Community acquired pneumonia of right lung, unspecified part of lung     MEDICATIONS GIVEN DURING THIS VISIT:  Medications  cefTRIAXone (ROCEPHIN) 2 g in sodium chloride 0.9 % 100 mL IVPB (2 g Intravenous New Bag/Given 02/08/19 0629)  azithromycin (ZITHROMAX) 500 mg in sodium chloride 0.9 % 250 mL IVPB (500 mg Intravenous New Bag/Given 02/08/19 0630)  furosemide (LASIX) injection 40 mg (40 mg Intravenous Given 02/08/19 0630)     ED Discharge Orders    None      *Please note:  Nisaiah Bechtol was evaluated in Emergency Department on 02/08/2019 for the symptoms described in the history of present illness. He was evaluated in the context of the global COVID-19 pandemic, which necessitated consideration that the patient might be at risk for infection with the SARS-CoV-2 virus that causes COVID-19. Institutional protocols and algorithms that pertain to the evaluation of patients at risk for COVID-19 are in a state of rapid change based on information released by regulatory bodies including the CDC and federal and state organizations. These policies and algorithms were followed during the patient's care in the ED.  Some ED evaluations and interventions may be delayed as a result of limited staffing during the pandemic.*  Note:  This document was prepared using Dragon voice recognition software and may  include unintentional dictation errors.   Gregor Hams, MD 02/08/19 [phone]

## 2019-02-08 NOTE — Progress Notes (Signed)
Family Meeting Note  Advance Directive:no  Today a meeting took place with the Patient.  Patient is able to participate.  The following clinical team members were present during this meeting:MD  The following were discussed:Patient's diagnosis: acute hypoxic respiratory failure, Patient's progosis: Unable to determine and Goals for treatment: Full Code  Additional follow-up to be provided: prn  Time spent during discussion:20 minutes  Katy D Mayo, MD  

## 2019-02-08 NOTE — Progress Notes (Signed)
CODE SEPSIS - PHARMACY COMMUNICATION  **Broad Spectrum Antibiotics should be administered within 1 hour of Sepsis diagnosis**  Time Code Sepsis Called/Page Received: 0610  Antibiotics Ordered: Rocephin and Zithromax  Time of 1st antibiotic administration: 0629, Rocephin  Additional action taken by pharmacy: n/a  If necessary, Name of Provider/Nurse Contacted: n/a    Ena Dawley ,PharmD Clinical Pharmacist  02/08/2019  6:13 AM

## 2019-02-08 NOTE — Progress Notes (Signed)
Notified by RN that patient's COVID test came back positive. Patient will need to be transferred to [facility] for further management.

## 2019-02-08 NOTE — ED Notes (Signed)
Patient visualized through door, resting in bed with eyes closed. Even, unlabored respirations noted. VSS at this time.

## 2019-02-08 NOTE — ED Notes (Signed)
Patient remains in mid 90s on [address]. Even, unlabored respirations noted

## 2019-02-08 NOTE — ED Notes (Signed)
Patient given remote and able to change channels on TV. Explained to patient that he would have to keep bipap on for a while longer per MD. Patient verbalized understanding. No further needs expressed at this time.

## 2019-02-08 NOTE — ED Triage Notes (Signed)
Pt arrived via EMS from home where family called out due to pt in respiratory distress. Per report, pt recently released from facility back home and tonight pt started to have difficulty breathing.Original O2 at 80% by EMS. Pt arrived to ED on CPAP. Pt at 75% on RA in ED. MD at bedside as well as respiratory.

## 2019-02-08 NOTE — ED Notes (Signed)
Patient taken off of bipap and placed on 6L [place]. Dr. Cinda Quest at bedside. Will continue to monitor for hypoxia and increased work of breathing

## 2019-02-09 ENCOUNTER — Ambulatory Visit (INDEPENDENT_AMBULATORY_CARE_PROVIDER_SITE_OTHER): Payer: Medicare Other | Admitting: Vascular Surgery

## 2019-02-09 DIAGNOSIS — R739 Hyperglycemia, unspecified: Secondary | ICD-10-CM

## 2019-02-09 LAB — CBC WITH DIFFERENTIAL/PLATELET
Abs Immature Granulocytes: 0.06 10*3/uL (ref 0.00–0.07)
Basophils Absolute: 0 10*3/uL (ref 0.0–0.1)
Basophils Relative: 0 %
Eosinophils Absolute: 0 10*3/uL (ref 0.0–0.5)
Eosinophils Relative: 0 %
HCT: 29.5 % — ABNORMAL LOW (ref 39.0–52.0)
Hemoglobin: 9 g/dL — ABNORMAL LOW (ref 13.0–17.0)
Immature Granulocytes: 1 %
Lymphocytes Relative: 3 %
Lymphs Abs: 0.2 10*3/uL — ABNORMAL LOW (ref 0.7–4.0)
MCH: 27.4 pg (ref 26.0–34.0)
MCHC: 30.5 g/dL (ref 30.0–36.0)
MCV: 89.7 fL (ref 80.0–100.0)
Monocytes Absolute: 0.1 10*3/uL (ref 0.1–1.0)
Monocytes Relative: 1 %
Neutro Abs: 6.7 10*3/uL (ref 1.7–7.7)
Neutrophils Relative %: 95 %
Platelets: 355 10*3/uL (ref 150–400)
RBC: 3.29 MIL/uL — ABNORMAL LOW (ref 4.22–5.81)
RDW: 15.3 % (ref 11.5–15.5)
WBC: 7.1 10*3/uL (ref 4.0–10.5)
nRBC: 0 % (ref 0.0–0.2)

## 2019-02-09 LAB — TROPONIN I (HIGH SENSITIVITY)
Troponin I (High Sensitivity): 264 ng/L (ref <18)
Troponin I (High Sensitivity): 400 ng/L (ref <18)

## 2019-02-09 LAB — COMPREHENSIVE METABOLIC PANEL
ALT: 22 U/L (ref 0–44)
AST: 28 U/L (ref 15–41)
Albumin: 2.5 g/dL — ABNORMAL LOW (ref 3.5–5.0)
Alkaline Phosphatase: 123 U/L (ref 38–126)
Anion gap: 8 (ref 5–15)
BUN: 38 mg/dL — ABNORMAL HIGH (ref 8–23)
CO2: 26 mmol/L (ref 22–32)
Calcium: 8.4 mg/dL — ABNORMAL LOW (ref 8.9–10.3)
Chloride: 104 mmol/L (ref 98–111)
Creatinine, Ser: 1.33 mg/dL — ABNORMAL HIGH (ref 0.61–1.24)
GFR calc Af Amer: 58 mL/min — ABNORMAL LOW (ref >60)
GFR calc non Af Amer: 50 mL/min — ABNORMAL LOW (ref >60)
Glucose, Bld: 313 mg/dL — ABNORMAL HIGH (ref 70–99)
Potassium: 5.3 mmol/L — ABNORMAL HIGH (ref 3.5–5.1)
Sodium: 138 mmol/L (ref 135–145)
Total Bilirubin: 0.7 mg/dL (ref 0.3–1.2)
Total Protein: 6.5 g/dL (ref 6.5–8.1)

## 2019-02-09 LAB — C-REACTIVE PROTEIN: CRP: 1.8 mg/dL — ABNORMAL HIGH (ref <1.0)

## 2019-02-09 LAB — GLUCOSE, CAPILLARY
Glucose-Capillary: 226 mg/dL — ABNORMAL HIGH (ref 70–99)
Glucose-Capillary: 306 mg/dL — ABNORMAL HIGH (ref 70–99)
Glucose-Capillary: 184 mg/dL — ABNORMAL HIGH (ref 70–99)
Glucose-Capillary: 268 mg/dL — ABNORMAL HIGH (ref 70–99)

## 2019-02-09 LAB — FERRITIN: Ferritin: 42 ng/mL (ref 24–336)

## 2019-02-09 LAB — MAGNESIUM: Magnesium: 1.8 mg/dL (ref 1.7–2.4)

## 2019-02-09 LAB — POTASSIUM: Potassium: 4.2 mmol/L (ref 3.5–5.1)

## 2019-02-09 LAB — D-DIMER, QUANTITATIVE: D-Dimer, Quant: 1.25 ug/mL-FEU — ABNORMAL HIGH (ref 0.00–0.50)

## 2019-02-09 MED ORDER — METOPROLOL TARTRATE 50 MG PO TABS
50.0000 mg | ORAL_TABLET | Freq: Three times a day (TID) | ORAL | Status: DC
  Administered 2019-02-09 – 2019-02-10 (×5): 50 mg via ORAL
  Filled 2019-02-09 (×5): qty 1

## 2019-02-09 MED ORDER — SILVER SULFADIAZINE 1 % EX CREA
1.0000 "application " | TOPICAL_CREAM | Freq: Two times a day (BID) | CUTANEOUS | Status: DC
  Administered 2019-02-09 – 2019-02-19 (×15): 1 via TOPICAL
  Filled 2019-02-09: qty 85

## 2019-02-09 MED ORDER — SODIUM ZIRCONIUM CYCLOSILICATE 10 G PO PACK
10.0000 g | PACK | Freq: Once | ORAL | Status: AC
  Administered 2019-02-09: 11:00:00 10 g via ORAL
  Filled 2019-02-09: qty 1

## 2019-02-09 MED ORDER — FAMOTIDINE 20 MG PO TABS
20.0000 mg | ORAL_TABLET | Freq: Every day | ORAL | Status: DC
  Administered 2019-02-09 – 2019-02-19 (×11): 20 mg via ORAL
  Filled 2019-02-09 (×11): qty 1

## 2019-02-09 MED ORDER — ZINC SULFATE 220 (50 ZN) MG PO CAPS
220.0000 mg | ORAL_CAPSULE | Freq: Every day | ORAL | Status: DC
  Administered 2019-02-09 – 2019-02-19 (×11): 220 mg via ORAL
  Filled 2019-02-09 (×11): qty 1

## 2019-02-09 MED ORDER — ISOSORBIDE MONONITRATE ER 30 MG PO TB24
30.0000 mg | ORAL_TABLET | Freq: Every day | ORAL | Status: DC
  Administered 2019-02-09 – 2019-02-11 (×3): 30 mg via ORAL
  Filled 2019-02-09 (×2): qty 1

## 2019-02-09 MED ORDER — AZITHROMYCIN 250 MG PO TABS
500.0000 mg | ORAL_TABLET | Freq: Every day | ORAL | Status: AC
  Administered 2019-02-09 – 2019-02-12 (×4): 500 mg via ORAL
  Filled 2019-02-09 (×4): qty 2

## 2019-02-09 MED ORDER — SODIUM CHLORIDE 0.9 % IV SOLN
100.0000 mg | INTRAVENOUS | Status: DC
  Administered 2019-02-09 – 2019-02-11 (×3): 100 mg via INTRAVENOUS
  Filled 2019-02-09 (×3): qty 20

## 2019-02-09 MED ORDER — ENSURE ENLIVE PO LIQD
237.0000 mL | Freq: Three times a day (TID) | ORAL | Status: DC
  Administered 2019-02-09 – 2019-02-19 (×19): 237 mL via ORAL

## 2019-02-09 MED ORDER — INSULIN GLARGINE 100 UNIT/ML ~~LOC~~ SOLN
12.0000 [IU] | Freq: Two times a day (BID) | SUBCUTANEOUS | Status: DC
  Administered 2019-02-09 (×2): 12 [IU] via SUBCUTANEOUS
  Filled 2019-02-09 (×3): qty 0.12

## 2019-02-09 MED ORDER — NITROGLYCERIN 0.4 MG SL SUBL
0.4000 mg | SUBLINGUAL_TABLET | SUBLINGUAL | Status: DC | PRN
  Administered 2019-02-09 (×2): 0.4 mg via SUBLINGUAL
  Filled 2019-02-09 (×2): qty 1

## 2019-02-09 MED ORDER — NITROGLYCERIN 2 % TD OINT
0.5000 [in_us] | TOPICAL_OINTMENT | Freq: Four times a day (QID) | TRANSDERMAL | Status: AC
  Administered 2019-02-09: 0.5 [in_us] via TOPICAL
  Filled 2019-02-09: qty 30

## 2019-02-09 MED ORDER — SODIUM CHLORIDE 0.9 % IV SOLN
1.0000 g | INTRAVENOUS | Status: AC
  Administered 2019-02-09 – 2019-02-12 (×4): 1 g via INTRAVENOUS
  Filled 2019-02-09 (×4): qty 10

## 2019-02-09 NOTE — Progress Notes (Signed)
The episodes of chest pain and the ECG findings were discussed by phone w Dr. [place] Pink. The patient has severe multivessel CAD, managed medically. He has developed a couple of episodes of angina, in the setting of acute illness. Both resolved w SL NTG. ECGs performed to day show a similar distribution of ST abnormalities, with worse amplitude of ST depression compared to the ECG from 08/11. (one ECG shows artifactual ST elevation due to lead reversal). Suspect angina due to increased demand (HR faster on current ECG) during acute illness. His anatomy is not readily amenable to PCI (LAD ostial occlusion. IV heparin could be used for 72h, but this would offer benefit only if this was an unstable plaque/atherothrombotic event, which is less likely. He is on Xarelto. Recommend increasing the dose of beta blocker, IV NTG if episodes recur/increase in frequency.  Sanda Klein, MD, [facility] [phone] office [phone] pager

## 2019-02-09 NOTE — Plan of Care (Signed)
Updated wife on current POC and chest pain this morning.

## 2019-02-09 NOTE — Progress Notes (Addendum)
PROGRESS NOTE  [name] [id number] DOB: [birth year] DOA: 02/08/2019  PCP: Corey Skains, MD  Brief History/Interval Summary: 84 y.o. male with a past medical history of chronic systolic CHF with a EF of 45 to 50%, atrial fibrillation on anticoagulation, insulin-dependent diabetes mellitus, essential hypertension, peripheral vascular disease, recent right below-knee amputation, previous left below-knee amputation, history of prostate cancer, history of sleep apnea on CPAP who recently came home from a skilled nursing facility a few days prior to this admission.  He was at the rehab facility after he underwent right below-knee amputation last month.  He was doing well and then the day prior to admission patient started developing shortness of breath.  Symptoms worsened during the nighttime so he had his wife called EMS.  He was taken to the emergency department at [facility]. In the emergency department patient was found to have right lung pneumonia.  He was noted to be hypoxic.  Initially placed on BiPAP.  Patient was hospitalized.  Subsequently the COVID-19 test came back positive.  He was subsequently sent over to the [facility] for further management.   Reason for Visit: Pneumonia secondary to COVID-19  Consultants: None  Procedures: None  Antibiotics: Anti-infectives (From admission, onward)   Start     Dose/Rate Route Frequency Ordered Stop   02/09/19 2000  remdesivir 100 mg in sodium chloride 0.9 % 250 mL IVPB     100 mg 500 mL/hr over 30 Minutes Intravenous Every 24 hours 02/08/19 1909 02/13/19 1959   02/08/19 2200  sulfamethoxazole-trimethoprim (BACTRIM) 400-80 MG per tablet 1 tablet  Status:  Discontinued     1 tablet Oral 2 times daily 02/08/19 1637 02/08/19 1701   02/08/19 2200  sulfamethoxazole-trimethoprim (BACTRIM) 400-80 MG per tablet 1 tablet     1 tablet Oral 2 times daily 02/08/19 1701     02/08/19 2000  remdesivir 200 mg in sodium chloride 0.9 %  250 mL IVPB     200 mg 500 mL/hr over 30 Minutes Intravenous Once 02/08/19 1909 02/09/19 0500       Subjective/Interval History: Patient states that he feels well.  Denies any complaints.  Shortness of breath is improving.  Occasional cough which is dry.  Denies any nausea vomiting.  Had some chest pain earlier today which is now resolved.  He also had a bowel movement yesterday.    Assessment/Plan:  Acute Hypoxic Resp. Failure due to Acute Covid 19 Viral Illness/pneumonia secondary to COVID-19/sepsis present on admission  COVID-19 Labs  Recent Labs    02/08/19 1707 02/09/19 0258  DDIMER  --  1.25*  FERRITIN 69 42  CRP 1.6* 1.8*    Lab Results  Component Value Date   SARSCOV2NAA POSITIVE (A) 02/08/2019   [place] NEGATIVE 01/05/2019   [place] NEGATIVE 12/20/2018   [place] NEGATIVE 12/15/2018     Fever: Febrile in the last 24 hours Oxygen requirements: 2 L of oxygen by nasal cannula.  Saturating in the early 90s. Antibacterials: Received ceftriaxone and azithromycin in the ED yesterday.  Currently on Bactrim for his wound. Remdesivir: Day 2 today Steroids: Dexamethasone 6 mg daily Diuretics: Not on a scheduled basis Actemra: Not indicated yet Vitamin C and Zinc: Will add zinc.  Already on vitamin C. DVT Prophylaxis: On rivaroxaban at home which is being continued  Patient's respiratory status appears to be stable.  He is requiring 2 L of oxygen by nasal cannula.  Continue steroids and Remdesivir.  Yesterday his WBC was noted to  be elevated.  Is normal today.  Procalcitonin noted to be 0.54.  Cannot rule out superimposed bacterial infection.  At this time we will reinitiate ceftriaxone and azithromycin.  He did get a dose yesterday in the ED.  Prone positioning as much as possible.  Likely limited by his bilateral BKA's.  Incentive spirometer.  Inflammatory markers only minimally elevated.  Ferritin 42.  CRP 1.8.  D-dimer 1.25.  Lactic acid level was elevated  but had been improving.  No need to recheck at this time.  Sepsis physiology has improved.  Recent right BKA with stump wound Followed by vascular surgery in [place], Dr. Lucky Cowboy.  Still has staples in the wound.  He was last seen in their office on September 8.  Had an appointment today but he is in the hospital.  This will need to be rescheduled.  Will discuss with his family.  He was placed on Bactrim at the office at the last visit.  According to the son patient has finished a course.  The wound actually does not look overtly infected.  Since we will be initiating ceftriaxone and azithromycin we will stop the Bactrim.  Wound care nurse to see.  Dressing changes.  Silver sulfadiazine cream.  Mild acute renal failure Baseline creatinine around 1.1.  Creatinine noted to be 1.38 yesterday.  1.33 today.  Noted to have elevated potassium as well which could be due to Bactrim which we will stop.  Lokelma will be given.  Recheck potassium level later today.  Monitor urine output.  Recheck labs tomorrow.  Chronic systolic CHF Echocardiogram from earlier this year showed a EF of 45 to 50%.  BNP was elevated in the ED.  Patient was given Lasix in the ED.  Currently appears to be euvolemic.  Will hold off on further doses of diuretics for now.  Essential hypertension Monitor blood pressures closely.  Continue with his home medications.  History of paroxysmal atrial fibrillation Continue with diltiazem and metoprolol.  Rate is well controlled.  He is on rivaroxaban for stroke prevention.  Coronary artery disease Patient followed by cardiology in [facility].  Patient had a cardiac catheterization earlier this year.  Was found to have significant disease.  Specifically RCA lesion of 75% distal LM to ostial LAD of 100% and proximal circumflex of 50%. EF was found to be 35% at that time.  Intervention was deferred in favor of aggressive medical therapy.  Patient was treated with dual antiplatelet  treatment with aspirin and Plavix.  Patient was supposed to be on Plavix with rivaroxaban but is noted to be on aspirin here.  Patient is on a beta-blocker.  Diabetes mellitus type 2, uncontrolled with hyperglycemia Elevated CBG most likely due to steroids.  HbA1c 8.9 in July.  Continue with Lantus.  Will adjust the dose today.  Hyperlipidemia Continue statin.  Normocytic anemia Drop in hemoglobin likely dilutional.  No overt bleeding noted.  Baseline hemoglobin between 8 and 9.  He was likely hemoconcentrated to begin with.  History of obstructive sleep apnea Cannot use CPAP in this facility.  Continue with oxygen.  Pressure ulcer As per wound care nurse. Pressure Injury 02/08/19 Buttocks Right;Lower Stage II -  Partial thickness loss of dermis presenting as a shallow open ulcer with a red, pink wound bed without slough. (Active)  02/08/19 1610  Location: Buttocks  Location Orientation: Right;Lower  Staging: Stage II -  Partial thickness loss of dermis presenting as a shallow open ulcer with a red, pink  wound bed without slough.  Wound Description (Comments):   Present on Admission: Yes     Pressure Injury 02/08/19 Scrotum Posterior;Medial Stage II -  Partial thickness loss of dermis presenting as a shallow open ulcer with a red, pink wound bed without slough. (Active)  02/08/19 1611  Location: Scrotum  Location Orientation: Posterior;Medial  Staging: Stage II -  Partial thickness loss of dermis presenting as a shallow open ulcer with a red, pink wound bed without slough.  Wound Description (Comments):   Present on Admission: Yes     ADDENDUM Patient complained of chest pain.  6 out of 10 in intensity in the retrosternal area.  He mentioned that he gets these pains occasionally at home for which he takes nitroglycerin with relief of symptoms usually.  EKG was done which shows ST changes in the lateral leads which were present previously.  There is also ST depression noted in lead  II.  Patient's chart was reviewed.  He had a cardiac catheterization earlier this year which showed significant disease but only aggressive medical management was pursued.  He is followed by cardiology in [place].  This could all be stable angina.  We will give him nitroglycerin.  If he has recurrent symptoms and EKG will be repeated and we will cycle troponin.  His vital signs are stable.  Patient symptoms resolved with nitroglycerin.  Started on isosorbide mononitrate.  Will recheck EKG.  Continue to monitor.  May need to discuss with cardiology and obtain troponins if patient symptoms recur.  EKG changes discussed with cardiology.  See their note.  Patient has not had any recurrence of chest pain.  Troponin level noted to be minimally elevated.  Continue to monitor closely for now.  DVT Prophylaxis: On rivaroxaban PUD Prophylaxis: Pepcid will be initiated Code Status: Full code Family Communication: Discussed with the patient.  We will update his son. Disposition Plan: Management as outlined above.  Patient with bilateral BKA.  Gets around in a wheelchair.   Medications:  Scheduled: . amiodarone  200 mg Oral Daily  . aspirin EC  81 mg Oral Daily  . atorvastatin  80 mg Oral QPM  . dexamethasone (DECADRON) injection  6 mg Intravenous Q24H  . diltiazem  120 mg Oral Daily  . feeding supplement (ENSURE ENLIVE)  237 mL Oral BID BM  . ferrous sulfate  325 mg Oral Q breakfast  . influenza vaccine adjuvanted  0.5 mL Intramuscular Tomorrow-1000  . insulin aspart  0-20 Units Subcutaneous TID WC  . insulin aspart  0-5 Units Subcutaneous QHS  . insulin glargine  15 Units Subcutaneous QHS  . mouth rinse  15 mL Mouth Rinse BID  . metoprolol tartrate  25 mg Oral TID  . multivitamin with minerals  1 tablet Oral Daily  . polyethylene glycol  17 g Oral BID  . Ensure Max Protein  11 oz Oral BID  . Rivaroxaban  15 mg Oral Q supper  . senna  2 tablet Oral QHS  . silver sulfADIAZINE  1 application  Topical Daily  . sodium chloride flush  3 mL Intravenous Q12H  . sodium chloride flush  3 mL Intravenous Q12H  . sodium zirconium cyclosilicate  10 g Oral Once  . sulfamethoxazole-trimethoprim  1 tablet Oral BID  . ascorbic acid  500 mg Oral Daily   Continuous: . sodium chloride    . remdesivir 100 mg in NS 250 mL     [id number] chloride, acetaminophen, bisacodyl, chlorpheniramine-HYDROcodone, guaiFENesin-dextromethorphan, HYDROcodone-acetaminophen, ondansetron **OR**  ondansetron (ZOFRAN) IV, sodium chloride flush   Objective:  Vital Signs  Vitals:   02/08/19 2100 02/09/19 0000 02/09/19 0400 02/09/19 0828  BP: (!) 148/60 (!) 139/58 (!) 148/60 (!) 143/71  Pulse: 64 61 60   Resp: 19 19 16 20   Temp: 99.3 F (37.4 C) 98.4 F (36.9 C) 98.2 F (36.8 C)   TempSrc:      SpO2: 93% 94% 93% 92%  Weight:      Height:        Intake/Output Summary (Last 24 hours) at 02/09/2019 0933 Last data filed at 02/09/2019 0400 Gross per 24 hour  Intake 730 ml  Output 901 ml  Net -171 ml   Filed Weights   02/08/19 1548  Weight: 78.3 kg    General appearance: Awake alert.  In no distress Resp: Coarse breath sounds bilaterally.  Mildly tachypneic.  No use of accessory muscles.  Crackles at the bases bilaterally.  No wheezing or rhonchi. Cardio: S1-S2 is normal regular.  No S3-S4.  No rubs murmurs or bruit GI: Abdomen is soft.  Nontender nondistended.  Bowel sounds are present normal.  No masses organomegaly Extremities: Bilateral BKA Skin: Right stump wound was described in H&P.  Covered with dressing today. Neurologic: No obvious focal neurological deficits.   Lab Results:  Data Reviewed: I have personally reviewed following labs and imaging studies  CBC: Recent Labs  Lab 02/08/19 0556 02/09/19 0258  WBC 17.5* 7.1  NEUTROABS 15.0* 6.7  HGB 10.0* 9.0*  HCT 31.8* 29.5*  MCV 88.6 89.7  PLT 410* [id number]    Basic Metabolic Panel: Recent Labs  Lab 02/08/19 0556 02/09/19 0258  NA  135 138  K 4.2 5.3*  CL 105 104  CO2 21* 26  GLUCOSE 265* 313*  BUN 36* 38*  CREATININE 1.38* 1.33*  CALCIUM 8.1* 8.4*  MG  --  1.8    GFR: Estimated Creatinine Clearance: 40.7 mL/min (A) (by C-G formula based on SCr of 1.33 mg/dL (H)).  Liver Function Tests: Recent Labs  Lab 02/08/19 1707 02/09/19 0258  AST 27 28  ALT 19 22  ALKPHOS 131* 123  BILITOT 0.5 0.7  PROT 6.6 6.5  ALBUMIN 2.6* 2.5*     Coagulation Profile: Recent Labs  Lab 02/08/19 0557  INR 3.0*     CBG: Recent Labs  Lab 02/08/19 1653 02/08/19 2146 02/09/19 0813  GLUCAP 102* 249* 226*     Anemia Panel: Recent Labs    02/08/19 1707 02/09/19 0258  FERRITIN 69 42    Recent Results (from the past 240 hour(s))  Blood Culture (routine x 2)     Status: None (Preliminary result)   Collection Time: 02/08/19  5:57 AM   Specimen: BLOOD  Result Value Ref Range Status   Specimen Description BLOOD BLOOD LEFT HAND  Final   Special Requests   Final    BOTTLES DRAWN AEROBIC AND ANAEROBIC Blood Culture adequate volume   Culture   Final    NO GROWTH 1 DAY Performed at [facility], [address] [place], [place] [address]    Report Status PENDING  Incomplete  Blood Culture (routine x 2)     Status: None (Preliminary result)   Collection Time: 02/08/19  5:57 AM   Specimen: BLOOD  Result Value Ref Range Status   Specimen Description BLOOD RIGHT WRIST  Final   Special Requests   Final    BOTTLES DRAWN AEROBIC AND ANAEROBIC Blood Culture results may not be optimal due  to an excessive volume of blood received in culture bottles   Culture   Final    NO GROWTH 1 DAY Performed at [facility], [address] [place], [place] [address]    Report Status PENDING  Incomplete  SARS Coronavirus 2 [facility] order, Performed in [facility] hospital lab) Nasopharyngeal Nasopharyngeal Swab     Status: Abnormal   Collection Time: 02/08/19  6:01 AM   Specimen: Nasopharyngeal Swab  Result  Value Ref Range Status   SARS Coronavirus 2 POSITIVE (A) NEGATIVE Final    Comment: RESULT CALLED TO, READ BACK BY AND VERIFIED WITH:  LAURA CATES AT 0735 02/08/2019 SDR (NOTE) If result is NEGATIVE SARS-CoV-2 target nucleic acids are NOT DETECTED. The SARS-CoV-2 RNA is generally detectable in upper and lower  respiratory specimens during the acute phase of infection. The lowest  concentration of SARS-CoV-2 viral copies this assay can detect is 250  copies / mL. A negative result does not preclude SARS-CoV-2 infection  and should not be used as the sole basis for treatment or other  patient management decisions.  A negative result may occur with  improper specimen collection / handling, submission of specimen other  than nasopharyngeal swab, presence of viral mutation(s) within the  areas targeted by this assay, and inadequate number of viral copies  (<250 copies / mL). A negative result must be combined with clinical  observations, patient history, and epidemiological information. If result is POSITIVE SARS-CoV-2 target nucleic acids are DETECTED. Th e SARS-CoV-2 RNA is generally detectable in upper and lower  respiratory specimens during the acute phase of infection.  Positive  results are indicative of active infection with SARS-CoV-2.  Clinical  correlation with patient history and other diagnostic information is  necessary to determine patient infection status.  Positive results do  not rule out bacterial infection or co-infection with other viruses. If result is PRESUMPTIVE POSTIVE SARS-CoV-2 nucleic acids MAY BE PRESENT.   A presumptive positive result was obtained on the submitted specimen  and confirmed on repeat testing.  While 2019 novel coronavirus  (SARS-CoV-2) nucleic acids may be present in the submitted sample  additional confirmatory testing may be necessary for epidemiological  and / or clinical management purposes  to differentiate between  SARS-CoV-2 and other  Sarbecovirus currently known to infect humans.  If clinically indicated additional testing with an alternate test  methodology [phone]) is  advised. The SARS-CoV-2 RNA is generally  detectable in upper and lower respiratory specimens during the acute  phase of infection. The expected result is Negative. Fact Sheet for Patients:  StrictlyIdeas.no Fact Sheet for Healthcare Providers: BankingDealers.co.za This test is not yet approved or cleared by the [place] FDA and has been authorized for detection and/or diagnosis of SARS-CoV-2 by FDA under an Emergency Use Authorization (EUA).  This EUA will remain in effect (meaning this test can be used) for the duration of the COVID-19 declaration under Section 564(b)(1) of the Act, 21 U.S.C. section 360bbb-3(b)(1), unless the authorization is terminated or revoked sooner. Performed at [facility], [address] [place], [place] [address]   MRSA PCR Screening     Status: None   Collection Time: 02/08/19  6:45 PM   Specimen: Nasal Mucosa; Nasopharyngeal  Result Value Ref Range Status   MRSA by PCR NEGATIVE NEGATIVE Final    Comment:        The GeneXpert MRSA Assay (FDA approved for NASAL specimens only), is one component of a comprehensive MRSA colonization  surveillance program. It is not intended to diagnose MRSA infection nor to guide or monitor treatment for MRSA infections. Performed at [facility], [place] [address] [place], [place] [address]       Radiology Studies: Dg Chest Port 1 View  Result Date: 02/08/2019 CLINICAL DATA:  Respiratory distress EXAM: PORTABLE CHEST 1 VIEW COMPARISON:  12/21/2018 FINDINGS: Chronic cardiomegaly and interstitial prominence with mild scarring at the right base. New asymmetric hazy airspace type density on the right. No effusion or pneumothorax. Stable borderline heart size. Artifact from overlapping hardware.  IMPRESSION: Hazy asymmetric right-sided opacity primarily suspicious for pneumonia. Background chronic bronchitic markings. Electronically Signed   By: Monte Fantasia M.D.   On: 02/08/2019 06:27       LOS: 1 day   [facility] Pager on [email or web address]  02/09/2019, 9:33 AM

## 2019-02-09 NOTE — Progress Notes (Signed)
CRITICAL VALUE ALERT  Critical Value:  Troponin 400  Date & Time Notied:  02/09/19 1725  Provider Notified: MD Maryland Pink and Primary RN Charlynn Court made aware  Orders Received/Actions taken: Awaiting return call from MD

## 2019-02-09 NOTE — Progress Notes (Signed)
CRITICAL VALUE ALERT  Critical Value:  Troponin 264  Date & Time Notied:  02/09/19 1604  Provider Notified: MD Maryland Pink and Primary RN Charlynn Court  Orders Received/Actions taken: Primary RN awaiting return call

## 2019-02-09 NOTE — Progress Notes (Signed)
Initial Nutrition Assessment RD working remotely.  DOCUMENTATION CODES:   Not applicable  INTERVENTION:   Increase Ensure Enlive to TID, each supplement provides 350 kcal and 20 grams of protein. D/C Ensure Max.  Continue MVI with minerals daily.  Pt receiving Hormel Shake daily with Breakfast which provides 520 kcals and 22 g of protein and Magic cup BID with lunch and dinner, each supplement provides 290 kcal and 9 grams of protein, automatically on meal trays to optimize nutritional intake.   NUTRITION DIAGNOSIS:   Increased nutrient needs related to acute illness, wound healing(COVID) as evidenced by estimated needs.  GOAL:   Patient will meet greater than or equal to 90% of their needs  MONITOR:   PO intake, Supplement acceptance, Labs, Skin  REASON FOR ASSESSMENT:   Malnutrition Screening Tool    ASSESSMENT:   84 yo male admitted with SOB, PNA, COVID-19 positive. PMH includes prostate cancer, CHF, DM, HTN, sleep apnea, A fib, PVD, HLD, CKD, L BKA, R BKA.   Unable to speak with patient over the phone today. 50% meal completion documented for supper yesterday. Patient with increased protein and calorie needs for wound healing and COVID. Ensure Enlive and Ensure Max supplements have been ordered. Patient would benefit from increased protein and calories, will continue Ensure Enlive (increase to TID) since it is high in protein and calories.   Labs reviewed. Potassium 5.3 (H)--> 4.2 WNL Troponin 264 (H) CBG's: 226-306  Medications reviewed and include decadron, ferrous sulfate, novolog, lantus, MVI, Miralax, Senokot, vitamin C, zinc sulfate.   Recent weight 72.6 kg on 9/8 Suspect current weight is above actual weight.  Will monitor weight trends.   NUTRITION - FOCUSED PHYSICAL EXAM:  unable to complete  Diet Order:   Diet Order            Diet Carb Modified Fluid consistency: Thin; Room service appropriate? Yes  Diet effective now               EDUCATION NEEDS:   Not appropriate for education at this time  Skin:  Skin Assessment: Skin Integrity Issues: Skin Integrity Issues:: Stage II, Other (Comment), DTI DTI: perineum Stage II: buttocks, scrotum Other: R stump wound, R knee full thickness wound  Last BM:  9/17  Height:   Ht Readings from Last 1 Encounters:  02/08/19 5\' 7"  (1.702 m)    Weight:   Wt Readings from Last 1 Encounters:  02/08/19 78.3 kg    Ideal Body Weight:  58.6 kg  BMI: 31 (adjusted for Bilateral BKAs), 28.9 using recent weight from 9/8.    Estimated Nutritional Needs:   Kcal:  2000-2200  Protein:  110-130 gm  Fluid:  >/= 2 L    Molli Barrows, RD, LDN, [place] Pager [phone] After Hours Pager [phone]

## 2019-02-09 NOTE — Consult Note (Signed)
[facility] Nurse wound consult note Consult is performed remotely and using technology (Face Time) with the assistance of Bedside RN, Rodman Key. Reason for Consult: Pressure injury to right buttock, dehisced surgical wound to right BKA (seen by [facility] PA Eulogio Ditch on 01/30/19) and right knee PrI. Patient has a RTC visit for next week with [facility]; patient should return to the care of [facility] for the stump wounds upon discharge from [place] [facility]. Wound type:pressure, surgical Pressure Injury POA: Yes Measurement: Right buttock:  2cm round x 0.1cm clean, pink, moist Stage 2 Right stump: 4cm x 10cm with 90% black eschar, loosening at periphery Right knee:  0.5cm x 1cm x 0.2cm red and yellow tissues (full thickness) Wound bed:As described above Drainage (amount, consistency, odor) consistent with autolytically debriding tissues Periwound:mild erythema Dressing procedure/placement/frequency: I will continue the POC put in place by [facility] for the right stump (silver sulfadiazine) but increase the dressing changes to twice daily and add them to the full thickness wound ion the right knee. It is noted that [facility] communicated to the patient that the limb is developing a contracture and that if a prosthesis was desired, another amputation (AKA) may be required. Silicone foam dressings with xeroform to the wound bed will be implemented to the buttock pressure injury. Turning and repositioning efforts are in place and should be continued to enhance tissue regeneration and repair.  [facility] nursing team will not follow, but will remain available to this patient, the nursing and medical teams.  Please re-consult if needed. Thanks, Maudie Flakes, MSN, RN, [place], Arther Abbott  Pager# [phone]

## 2019-02-09 NOTE — Plan of Care (Addendum)
Patient complaining of chest pain. Vitals stable but just changed BKA dressing with Wound RN, via facetime. Patient requesting Nitrol.  Dr. Text to inform and ordered STAT EKG.  EKG was abnormal with possible ST abnormality and prior infarct noted. Placed on chart.  Nitro x1 ordered and given - Patient stated it helped a lot and pain is almost gone.   Repeat EKG to be completed at 1230 per Dr. Montine Circle.  Patient stated he still had pain and needed another nitro.  RN gave and now patien states," Im not hurting in my chest anymore."  Dr. Arville Go aware.  Dr. Text and paged to inform about critical Troponin: 264. No new orders at this time - Asked to continue to monitor and give Nitro if has chest pain.   Notify if continues to have chest pain that's NOT relieved by Nitro.  Dr. Informed of 2nd troponin critical draw of 400. No further orders at this time.  Patient is still reporting no chest pain or discomfort. Will continue to assess as requested.

## 2019-02-10 DIAGNOSIS — I25118 Atherosclerotic heart disease of native coronary artery with other forms of angina pectoris: Secondary | ICD-10-CM

## 2019-02-10 LAB — COMPREHENSIVE METABOLIC PANEL
ALT: 23 U/L (ref 0–44)
AST: 30 U/L (ref 15–41)
Albumin: 2.6 g/dL — ABNORMAL LOW (ref 3.5–5.0)
Alkaline Phosphatase: 123 U/L (ref 38–126)
Anion gap: 8 (ref 5–15)
BUN: 43 mg/dL — ABNORMAL HIGH (ref 8–23)
CO2: 24 mmol/L (ref 22–32)
Calcium: 8.4 mg/dL — ABNORMAL LOW (ref 8.9–10.3)
Chloride: 104 mmol/L (ref 98–111)
Creatinine, Ser: 1.08 mg/dL (ref 0.61–1.24)
GFR calc Af Amer: >60 mL/min (ref >60)
GFR calc non Af Amer: >60 mL/min (ref >60)
Glucose, Bld: 299 mg/dL — ABNORMAL HIGH (ref 70–99)
Potassium: 4.6 mmol/L (ref 3.5–5.1)
Sodium: 136 mmol/L (ref 135–145)
Total Bilirubin: 0.7 mg/dL (ref 0.3–1.2)
Total Protein: 6.3 g/dL — ABNORMAL LOW (ref 6.5–8.1)

## 2019-02-10 LAB — CBC WITH DIFFERENTIAL/PLATELET
Abs Immature Granulocytes: 0.06 10*3/uL (ref 0.00–0.07)
Basophils Absolute: 0 10*3/uL (ref 0.0–0.1)
Basophils Relative: 0 %
Eosinophils Absolute: 0 10*3/uL (ref 0.0–0.5)
Eosinophils Relative: 0 %
HCT: 27.1 % — ABNORMAL LOW (ref 39.0–52.0)
Hemoglobin: 8.5 g/dL — ABNORMAL LOW (ref 13.0–17.0)
Immature Granulocytes: 1 %
Lymphocytes Relative: 4 %
Lymphs Abs: 0.4 10*3/uL — ABNORMAL LOW (ref 0.7–4.0)
MCH: 27.9 pg (ref 26.0–34.0)
MCHC: 31.4 g/dL (ref 30.0–36.0)
MCV: 88.9 fL (ref 80.0–100.0)
Monocytes Absolute: 0.2 10*3/uL (ref 0.1–1.0)
Monocytes Relative: 2 %
Neutro Abs: 8.4 10*3/uL — ABNORMAL HIGH (ref 1.7–7.7)
Neutrophils Relative %: 93 %
Platelets: 375 10*3/uL (ref 150–400)
RBC: 3.05 MIL/uL — ABNORMAL LOW (ref 4.22–5.81)
RDW: 15.4 % (ref 11.5–15.5)
WBC: 9 10*3/uL (ref 4.0–10.5)
nRBC: 0 % (ref 0.0–0.2)

## 2019-02-10 LAB — D-DIMER, QUANTITATIVE: D-Dimer, Quant: 0.98 ug/mL-FEU — ABNORMAL HIGH (ref 0.00–0.50)

## 2019-02-10 LAB — GLUCOSE, CAPILLARY
Glucose-Capillary: 238 mg/dL — ABNORMAL HIGH (ref 70–99)
Glucose-Capillary: 372 mg/dL — ABNORMAL HIGH (ref 70–99)
Glucose-Capillary: 248 mg/dL — ABNORMAL HIGH (ref 70–99)
Glucose-Capillary: 344 mg/dL — ABNORMAL HIGH (ref 70–99)

## 2019-02-10 LAB — C-REACTIVE PROTEIN: CRP: 1.5 mg/dL — ABNORMAL HIGH (ref <1.0)

## 2019-02-10 LAB — HEMOGLOBIN A1C
Hgb A1c MFr Bld: 6.3 % — ABNORMAL HIGH (ref 4.8–5.6)
Mean Plasma Glucose: 134 mg/dL

## 2019-02-10 LAB — FERRITIN: Ferritin: 60 ng/mL (ref 24–336)

## 2019-02-10 LAB — MAGNESIUM: Magnesium: 1.8 mg/dL (ref 1.7–2.4)

## 2019-02-10 MED ORDER — INSULIN GLARGINE 100 UNIT/ML ~~LOC~~ SOLN
15.0000 [IU] | Freq: Two times a day (BID) | SUBCUTANEOUS | Status: DC
  Administered 2019-02-10 – 2019-02-12 (×5): 15 [IU] via SUBCUTANEOUS
  Filled 2019-02-10 (×5): qty 0.15

## 2019-02-10 MED ORDER — HALOPERIDOL LACTATE 5 MG/ML IJ SOLN
2.0000 mg | Freq: Four times a day (QID) | INTRAMUSCULAR | Status: DC | PRN
  Administered 2019-02-11: 20:00:00 5 mg via INTRAVENOUS
  Administered 2019-02-11: 2 mg via INTRAVENOUS
  Administered 2019-02-12 – 2019-02-14 (×6): 5 mg via INTRAVENOUS
  Filled 2019-02-10 (×7): qty 1

## 2019-02-10 MED ORDER — HALOPERIDOL LACTATE 5 MG/ML IJ SOLN
2.0000 mg | Freq: Four times a day (QID) | INTRAMUSCULAR | Status: DC | PRN
  Filled 2019-02-10: qty 1

## 2019-02-10 NOTE — Plan of Care (Signed)
Will continue with plan of care. 

## 2019-02-10 NOTE — Progress Notes (Signed)
As night progressed, patient became more uncooperative with staff.  He became very aggressive and belligerent with staff.  He swung at this nurse when I tried to put his telemetry leads back on.  He tore his nasal cannula, gown, covers, telemetry leads and pulse ox to the floor.  He stated we were trying to strangle him with all this equipment.   He can tell me birth date, name, and that he is in the hospital.  He eventually let us put a gown, covers, and pulse ox on his ear.  He did take his insulin, hydrocodone, and metoprolol.  Dr. Alcario Drought made aware.  PRN Haldol ordered received.  Also, I am concerned about patient's wife being able to care for patient at home after discharge.  Per wife, he has become mean and difficult to control at night after being discharged from rehab after his Rt AKA.  She is open to our SW reaching out to her to discuss options for safely caring for her husband.  He refused dressing change to right stump.  Will continue to monitor patient.  Earleen Reaper RN

## 2019-02-10 NOTE — Progress Notes (Signed)
Called and spoke with patient's wife, Everlene Farrier.  I explained to her that patient had become very aggressive and belligerent with staff.  He tore the  Nasal cannula, gown, covers, telemetry leads, and pulse ox probe off.  His wife stated normally that he is cooperative.  However, after he was discharged home from rehab after his right AKA, his mentation changed.  She stated he would be ok during the day but at night he would become very mean and uncooperative.  I expressed that I was concerned about her being able to take care of him by herself and she expressed the same concern.   I asked if she would be willing to talk to our SW about options for taking care of her and her husband.  She is agreeable to this.  She expressed her thanks for all that we do.  Earleen Reaper RN

## 2019-02-10 NOTE — Progress Notes (Signed)
Patient with increased agitation, trying to hit RNs, sundowning it seems.  Also seems that per wife he has been getting this at home too ever since the [place] on 12/27/2018.  Haldol PRN ordered for the moment.  Most recent QTc (before he ripped heart monitor off) was in the 430.  O2 sat on still for the moment, though he apparently may yank that off too.  Satting 94% on 1-2L only.

## 2019-02-10 NOTE — Progress Notes (Signed)
PROGRESS NOTE  [name] [id number] DOB: [birth year] DOA: 02/08/2019  PCP: Corey Skains, MD  Brief History/Interval Summary: 84 y.o. male with a past medical history of chronic systolic CHF with a EF of 45 to 50%, atrial fibrillation on anticoagulation, insulin-dependent diabetes mellitus, essential hypertension, peripheral vascular disease, recent right below-knee amputation, previous left below-knee amputation, history of prostate cancer, history of sleep apnea on CPAP who recently came home from a skilled nursing facility a few days prior to this admission.  He was at the rehab facility after he underwent right below-knee amputation last month.  He was doing well and then the day prior to admission patient started developing shortness of breath.  Symptoms worsened during the nighttime so he had his wife called EMS.  He was taken to the emergency department at [facility]. In the emergency department patient was found to have right lung pneumonia.  He was noted to be hypoxic.  Initially placed on BiPAP.  Patient was hospitalized.  Subsequently the COVID-19 test came back positive.  He was subsequently sent over to the [facility] for further management.   Reason for Visit: Pneumonia secondary to COVID-19  Consultants: None  Procedures: None  Antibiotics: Anti-infectives (From admission, onward)   Start     Dose/Rate Route Frequency Ordered Stop   02/09/19 2000  remdesivir 100 mg in sodium chloride 0.9 % 250 mL IVPB  Status:  Discontinued     100 mg 500 mL/hr over 30 Minutes Intravenous Every 24 hours 02/08/19 1909 02/09/19 1303   02/09/19 1600  remdesivir 100 mg in sodium chloride 0.9 % 250 mL IVPB     100 mg 500 mL/hr over 30 Minutes Intravenous Every 24 hours 02/09/19 1303 02/13/19 1559   02/09/19 1000  cefTRIAXone (ROCEPHIN) 1 g in sodium chloride 0.9 % 100 mL IVPB     1 g 200 mL/hr over 30 Minutes Intravenous Every 24 hours 02/09/19 0949     02/09/19 1000   azithromycin (ZITHROMAX) tablet 500 mg     500 mg Oral Daily 02/09/19 0949     02/08/19 2200  sulfamethoxazole-trimethoprim (BACTRIM) 400-80 MG per tablet 1 tablet  Status:  Discontinued     1 tablet Oral 2 times daily 02/08/19 1637 02/08/19 1701   02/08/19 2200  sulfamethoxazole-trimethoprim (BACTRIM) 400-80 MG per tablet 1 tablet  Status:  Discontinued     1 tablet Oral 2 times daily 02/08/19 1701 02/09/19 0949   02/08/19 2000  remdesivir 200 mg in sodium chloride 0.9 % 250 mL IVPB     200 mg 500 mL/hr over 30 Minutes Intravenous Once 02/08/19 1909 02/09/19 0500       Subjective/Interval History: Patient states that he slept reasonably well overnight.  No further episodes of chest pain.  No shortness of breath this morning.  Cough is improving.      Assessment/Plan:  Acute Hypoxic Resp. Failure due to Acute Covid 19 Viral Illness/pneumonia secondary to COVID-19/sepsis present on admission  COVID-19 Labs  Recent Labs    02/08/19 1707 02/09/19 0258 02/10/19 0150  DDIMER  --  1.25* 0.98*  FERRITIN 69 42 60  CRP 1.6* 1.8* 1.5*    Lab Results  Component Value Date   SARSCOV2NAA POSITIVE (A) 02/08/2019   SARSCOV2NAA NEGATIVE 01/05/2019   [place] NEGATIVE 12/20/2018   [place] NEGATIVE 12/15/2018     Fever: Remains afebrile Oxygen requirements: Oxygen by nasal cannula at 1 L/min.  Saturating in the mid to late 90s.  Antibacterials: Ceftriaxone and azithromycin day 3 today Remdesivir: Day 3 today Steroids: Dexamethasone 6 mg daily Diuretics: Not on a scheduled basis Actemra: Not indicated yet Vitamin C and Zinc: Continue  DVT Prophylaxis: On rivaroxaban at home which is being continued  His respiratory status remained stable.  Oxygen requirements continue to decrease.  Continue oral steroids and Remdesivir.  Procalcitonin was noted to be elevated at 0.54.  Cannot rule out bacterial infection so he was started back on ceftriaxone and azithromycin.  Inflammatory  markers stable.  Continue prone positioning as much as possible.  Incentive spirometry.  Mobilization as much as possible.  Limited by his bilateral BKA's.  Stable angina in the setting of known coronary artery disease Patient had anginal chest pain yesterday relieved with nitroglycerin.  Minimal elevation in troponin noted.  Discussed with cardiology as patient did have some EKG changes.  Appreciate their input.  Patient's beta-blocker dose was increased.  He was started on Imdur.  Nitro paste was applied yesterday.  With this his symptoms have resolved.  No further interventions at this time.  Continue aspirin.  Patient is anticoagulated with rivaroxaban.  Patient followed by cardiology in [facility].  Patient had a cardiac catheterization earlier this year.  Was found to have significant disease.  Specifically RCA lesion of 75% distal LM to ostial LAD of 100% and proximal circumflex of 50%. EF was found to be 35% at that time.  Intervention was deferred in favor of aggressive medical therapy.    Recent right BKA with stump wound Followed by vascular surgery in [place], Dr. Lucky Cowboy.  Still has staples in the wound.  He was last seen in their office on September 8.  Had an appointment on 9/18 but he was in the hospital.  This will be rescheduled for next week.  He was placed on Bactrim at the office at the last visit.  According to the son patient has finished a course.  So Bactrim was discontinued.  Wound care nurse has seen the patient.  Continue with dressing changes and silver sulfadiazine cream.   Mild acute renal failure Baseline creatinine around 1.1.  At admission.  Improved today to 1.08.  Monitor urine output.  Potassium level is normal today.    Chronic systolic CHF Echocardiogram from earlier this year showed a EF of 45 to 50%.  Ejection fraction with cardiac catheterization noted to be around 35%.  BNP was elevated in the ED.  Patient was given Lasix in the ED. remains  euvolemic.  Continue to monitor clinically.    Essential hypertension Blood pressure was noted to be poorly controlled yesterday.  With adjustments to beta-blocker and addition of nitrates blood pressure seems to be slightly better today.  Continue to monitor.  History of paroxysmal atrial fibrillation Continue with diltiazem and metoprolol.  Rate is well controlled.  He is on rivaroxaban for stroke prevention.  Diabetes mellitus type 2, uncontrolled with hyperglycemia Elevated CBG most likely due to steroids.  HbA1c 8.9 in July.  Patient is on Lantus and SSI.  Adjust dose of Lantus.    Hyperlipidemia Continue statin.  Normocytic anemia Drop in hemoglobin likely dilutional.  No overt bleeding noted.  Baseline hemoglobin between 8 and 9.  He was likely hemoconcentrated to begin with.  History of obstructive sleep apnea Cannot use CPAP in this facility.  Continue with oxygen.  Pressure ulcer As per wound care nurse. Pressure Injury 02/08/19 Buttocks Right;Lower Stage II -  Partial thickness loss of dermis presenting  as a shallow open ulcer with a red, pink wound bed without slough. (Active)  02/08/19 1610  Location: Buttocks  Location Orientation: Right;Lower  Staging: Stage II -  Partial thickness loss of dermis presenting as a shallow open ulcer with a red, pink wound bed without slough.  Wound Description (Comments):   Present on Admission: Yes     Pressure Injury 02/08/19 Scrotum Posterior;Medial Stage II -  Partial thickness loss of dermis presenting as a shallow open ulcer with a red, pink wound bed without slough. (Active)  02/08/19 1611  Location: Scrotum  Location Orientation: Posterior;Medial  Staging: Stage II -  Partial thickness loss of dermis presenting as a shallow open ulcer with a red, pink wound bed without slough.  Wound Description (Comments):   Present on Admission: Yes     DVT Prophylaxis: On rivaroxaban PUD Prophylaxis: Pepcid Code Status: Full  code Family Communication: Discussed with patient.  Son being updated on a daily basis. Disposition Plan: Hopefully home when ready for discharge.   Medications:  Scheduled: . amiodarone  200 mg Oral Daily  . aspirin EC  81 mg Oral Daily  . atorvastatin  80 mg Oral QPM  . azithromycin  500 mg Oral Daily  . dexamethasone (DECADRON) injection  6 mg Intravenous Q24H  . diltiazem  120 mg Oral Daily  . famotidine  20 mg Oral Daily  . feeding supplement (ENSURE ENLIVE)  237 mL Oral TID BM  . ferrous sulfate  325 mg Oral Q breakfast  . insulin aspart  0-20 Units Subcutaneous TID WC  . insulin aspart  0-5 Units Subcutaneous QHS  . insulin glargine  12 Units Subcutaneous BID  . isosorbide mononitrate  30 mg Oral Daily  . mouth rinse  15 mL Mouth Rinse BID  . metoprolol tartrate  50 mg Oral TID  . multivitamin with minerals  1 tablet Oral Daily  . polyethylene glycol  17 g Oral BID  . Rivaroxaban  15 mg Oral Q supper  . senna  2 tablet Oral QHS  . silver sulfADIAZINE  1 application Topical BID  . sodium chloride flush  3 mL Intravenous Q12H  . sodium chloride flush  3 mL Intravenous Q12H  . ascorbic acid  500 mg Oral Daily  . zinc sulfate  220 mg Oral Daily   Continuous: . sodium chloride    . cefTRIAXone (ROCEPHIN)  IV 1 g (02/09/19 1142)  . remdesivir 100 mg in NS 250 mL 100 mg (02/09/19 1623)   [id number] chloride, acetaminophen, bisacodyl, chlorpheniramine-HYDROcodone, guaiFENesin-dextromethorphan, HYDROcodone-acetaminophen, nitroGLYCERIN, ondansetron **OR** ondansetron (ZOFRAN) IV, sodium chloride flush   Objective:  Vital Signs  Vitals:   02/09/19 1433 02/09/19 1951 02/10/19 0100 02/10/19 0810  BP: (!) 148/62 (!) 141/67 (!) 150/69 (!) 154/72  Pulse: 86 67 66 (!) 59  Resp:  20 19 17   Temp:  99.1 F (37.3 C) 98.8 F (37.1 C) 98.2 F (36.8 C)  TempSrc:  Oral Oral Oral  SpO2:  98% 95% 97%  Weight:      Height:        Intake/Output Summary (Last 24 hours) at  02/10/2019 0921 Last data filed at 02/10/2019 0200 Gross per 24 hour  Intake 460 ml  Output 750 ml  Net -290 ml   Filed Weights   02/08/19 1548  Weight: 78.3 kg   General appearance: Awake alert.  In no distress Resp: Normal effort at rest.  Coarse breath sounds bilaterally.  No wheezing or rhonchi.  Few  crackles at the bases. Cardio: S1-S2 is normal regular.  No S3-S4.  No rubs murmurs or bruit GI: Abdomen is soft.  Nontender nondistended.  Bowel sounds are present normal.  No masses organomegaly Extremities: Bilateral BKA Wound over the right stump: Covered with dressing today. Neurologic: No obvious focal neurological deficits.    Lab Results:  Data Reviewed: I have personally reviewed following labs and imaging studies  CBC: Recent Labs  Lab 02/08/19 0556 02/09/19 0258 02/10/19 0150  WBC 17.5* 7.1 9.0  NEUTROABS 15.0* 6.7 8.4*  HGB 10.0* 9.0* 8.5*  HCT 31.8* 29.5* 27.1*  MCV 88.6 89.7 88.9  PLT 410* 355 [id number]    Basic Metabolic Panel: Recent Labs  Lab 02/08/19 0556 02/09/19 0258 02/09/19 1356 02/10/19 0150  NA 135 138  --  136  K 4.2 5.3* 4.2 4.6  CL 105 104  --  104  CO2 21* 26  --  24  GLUCOSE 265* 313*  --  299*  BUN 36* 38*  --  43*  CREATININE 1.38* 1.33*  --  1.08  CALCIUM 8.1* 8.4*  --  8.4*  MG  --  1.8  --  1.8    GFR: Estimated Creatinine Clearance: 50.2 mL/min (by C-G formula based on SCr of 1.08 mg/dL).  Liver Function Tests: Recent Labs  Lab 02/08/19 1707 02/09/19 0258 02/10/19 0150  AST 27 28 30   ALT 19 22 23   ALKPHOS 131* 123 123  BILITOT 0.5 0.7 0.7  PROT 6.6 6.5 6.3*  ALBUMIN 2.6* 2.5* 2.6*     Coagulation Profile: Recent Labs  Lab 02/08/19 0557  INR 3.0*     CBG: Recent Labs  Lab 02/09/19 0813 02/09/19 1233 02/09/19 1636 02/09/19 2116 02/10/19 0721  GLUCAP 226* 306* 184* 268* 238*     Anemia Panel: Recent Labs    02/09/19 0258 02/10/19 0150  FERRITIN 42 60    Recent Results (from the past 240  hour(s))  Blood Culture (routine x 2)     Status: None (Preliminary result)   Collection Time: 02/08/19  5:57 AM   Specimen: BLOOD  Result Value Ref Range Status   Specimen Description BLOOD BLOOD LEFT HAND  Final   Special Requests   Final    BOTTLES DRAWN AEROBIC AND ANAEROBIC Blood Culture adequate volume   Culture   Final    NO GROWTH 2 DAYS Performed at [facility], [address] [place], [place] [address]    Report Status PENDING  Incomplete  Blood Culture (routine x 2)     Status: None (Preliminary result)   Collection Time: 02/08/19  5:57 AM   Specimen: BLOOD  Result Value Ref Range Status   Specimen Description BLOOD RIGHT WRIST  Final   Special Requests   Final    BOTTLES DRAWN AEROBIC AND ANAEROBIC Blood Culture results may not be optimal due to an excessive volume of blood received in culture bottles   Culture   Final    NO GROWTH 2 DAYS Performed at [facility], [address] [place], [place] [address]    Report Status PENDING  Incomplete  SARS Coronavirus 2 [facility] order, Performed in [place] hospital lab) Nasopharyngeal Nasopharyngeal Swab     Status: Abnormal   Collection Time: 02/08/19  6:01 AM   Specimen: Nasopharyngeal Swab  Result Value Ref Range Status   SARS Coronavirus 2 POSITIVE (A) NEGATIVE Final    Comment: RESULT CALLED TO, READ BACK BY AND VERIFIED WITH:  LAURA  CATES AT [id number] 02/08/2019 SDR (NOTE) If result is NEGATIVE SARS-CoV-2 target nucleic acids are NOT DETECTED. The SARS-CoV-2 RNA is generally detectable in upper and lower  respiratory specimens during the acute phase of infection. The lowest  concentration of SARS-CoV-2 viral copies this assay can detect is 250  copies / mL. A negative result does not preclude SARS-CoV-2 infection  and should not be used as the sole basis for treatment or other  patient management decisions.  A negative result may occur with  improper specimen collection / handling, submission  of specimen other  than nasopharyngeal swab, presence of viral mutation(s) within the  areas targeted by this assay, and inadequate number of viral copies  (<250 copies / mL). A negative result must be combined with clinical  observations, patient history, and epidemiological information. If result is POSITIVE SARS-CoV-2 target nucleic acids are DETECTED. Th e SARS-CoV-2 RNA is generally detectable in upper and lower  respiratory specimens during the acute phase of infection.  Positive  results are indicative of active infection with SARS-CoV-2.  Clinical  correlation with patient history and other diagnostic information is  necessary to determine patient infection status.  Positive results do  not rule out bacterial infection or co-infection with other viruses. If result is PRESUMPTIVE POSTIVE SARS-CoV-2 nucleic acids MAY BE PRESENT.   A presumptive positive result was obtained on the submitted specimen  and confirmed on repeat testing.  While 2019 novel coronavirus  (SARS-CoV-2) nucleic acids may be present in the submitted sample  additional confirmatory testing may be necessary for epidemiological  and / or clinical management purposes  to differentiate between  SARS-CoV-2 and other Sarbecovirus currently known to infect humans.  If clinically indicated additional testing with an alternate test  methodology [phone]) is  advised. The SARS-CoV-2 RNA is generally  detectable in upper and lower respiratory specimens during the acute  phase of infection. The expected result is Negative. Fact Sheet for Patients:  StrictlyIdeas.no Fact Sheet for Healthcare Providers: BankingDealers.co.za This test is not yet approved or cleared by the [place] FDA and has been authorized for detection and/or diagnosis of SARS-CoV-2 by FDA under an Emergency Use Authorization (EUA).  This EUA will remain in effect (meaning this test can be used) for  the duration of the COVID-19 declaration under Section 564(b)(1) of the Act, 21 U.S.C. section 360bbb-3(b)(1), unless the authorization is terminated or revoked sooner. Performed at [facility], [address] [place], [place] [address]   MRSA PCR Screening     Status: None   Collection Time: 02/08/19  6:45 PM   Specimen: Nasal Mucosa; Nasopharyngeal  Result Value Ref Range Status   MRSA by PCR NEGATIVE NEGATIVE Final    Comment:        The GeneXpert MRSA Assay (FDA approved for NASAL specimens only), is one component of a comprehensive MRSA colonization surveillance program. It is not intended to diagnose MRSA infection nor to guide or monitor treatment for MRSA infections. Performed at [facility], [place] [address], [place], [place] [address]       Radiology Studies: No results found.     LOS: 2 days   [facility] Pager on [email or web address]  02/10/2019, 9:21 AM

## 2019-02-10 NOTE — TOC Initial Note (Signed)
Transition of Care [facility]) - Initial/Assessment Note    Patient Details  Name: [name] MRN: [id number] Date of Birth: Nov 18, 1934  [facility] of [facility]) CM/SW Contact:    Ninfa Meeker, RN Phone Number: [phone] (working remotely) 02/10/2019, 12:45 PM  Clinical Narrative:   84 yr old gentleman admitted with COVID 64. Was at SNF for rehab after a Right BKA, has old left AKA. Case manager will follow for appropriate disposition as he medically improves. May he be blessed to do so.                      Patient Goals and CMS Choice        Expected Discharge Plan and Services                                                Prior Living Arrangements/Services                       Activities of Daily Living Home Assistive Devices/Equipment: Wheelchair ADL Screening (condition at time of admission) Patient's cognitive ability adequate to safely complete daily activities?: Yes Is the patient deaf or have difficulty hearing?: Yes Does the patient have difficulty seeing, even when wearing glasses/contacts?: No Does the patient have difficulty concentrating, remembering, or making decisions?: Yes Patient able to express need for assistance with ADLs?: Yes Does the patient have difficulty dressing or bathing?: Yes Independently performs ADLs?: No Communication: Independent Dressing (OT): Needs assistance Is this a change from baseline?: Pre-admission baseline Grooming: Needs assistance Is this a change from baseline?: Pre-admission baseline Feeding: Independent Bathing: Needs assistance Is this a change from baseline?: Pre-admission baseline Toileting: Needs assistance Is this a change from baseline?: Pre-admission baseline In/Out Bed: Needs assistance Is this a change from baseline?: Pre-admission baseline Walks in Home: Independent with device (comment) Does the patient have difficulty walking or climbing stairs?: Yes Weakness of  Legs: Both Weakness of Arms/Hands: None  Permission Sought/Granted                  Emotional Assessment              Admission diagnosis:  PNEUMONIA COVID-19 VIRUS INFECTION Patient Active Problem List   Diagnosis Date Noted  . Acute respiratory failure with hypoxia ([place]) 02/08/2019  . Pneumonia due to COVID-19 virus 02/08/2019  . Below-knee amputation of right lower extremity ([place]) 01/15/2019  . Polyp of descending colon   . Iron deficiency anemia due to chronic blood loss   . Sacral decubitus ulcer, stage II ([place]) 12/26/2018  . Acute respiratory failure ([place]) 12/20/2018  . Femur fracture ([place]) 10/13/2018  . Chest pain 06/03/2018  . NSTEMI (non-ST elevated myocardial infarction) ([place]) 01/16/2018  . Acute CHF (congestive heart failure) ([place]) 11/23/2017  . Atherosclerosis of native arteries of the extremities with ulceration ([place]) 11/18/2017  . Atrial fibrillation with rapid ventricular response ([place]) 10/15/2017  . BKA stump complication ([place]) 0000000  . 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 ([place]) 02/10/2017  . S/P BKA (below knee amputation) unilateral, left ([place]) 02/01/2017  . Chronic diastolic heart failure ([place]) 01/21/2017  . Pressure injury of skin 01/18/2017  . Atherosclerosis of artery of extremity with gangrene ([place]) 01/05/2017  . Diabetic foot ulcer ([place]) 12/29/2016  . Ataxia 10/21/2016  . History of femoral  angiogram 09/06/2016  . Left leg pain 09/06/2016  . Leukocytosis 09/06/2016  . Generalized weakness 09/06/2016  . Atrial fibrillation, chronic 08/30/2016  . Pneumonia 05/19/2016  . Hyperlipidemia 04/20/2016  . Back pain 04/19/2016  . Type 2 diabetes mellitus treated with insulin ([place]) 04/19/2016  . Essential hypertension 04/19/2016  . PAD (peripheral artery disease) ([place]) 04/19/2016  . Erectile dysfunction following radical prostatectomy 06/25/2015  . History of prostate cancer 12/23/2014  . Incontinence 12/23/2014   PCP:   Corey Skains, MD Pharmacy:   [name] [address], [place] - [place] [address] Phone: [phone] Fax: [phone]  [facility] [place], [place] [address] [place] [address] Phone: [phone] Fax: [place], [place] - [address] [facility]. [address] [place] [address] Phone: [phone] Fax: [phone]     Social Determinants of Health (SDOH) Interventions    Readmission Risk Interventions Readmission Risk Prevention Plan 12/25/2018 12/23/2018  Transportation Screening Complete Complete  PCP or Specialist Appt within 3-5 Days Complete -  HRI or Early Complete Complete  Social Work Consult for [place] Planning/Counseling Complete -  Palliative Care Screening Not Applicable -  Medication Review Press photographer) Complete Complete  Some recent data might be hidden

## 2019-02-11 LAB — CBC WITH DIFFERENTIAL/PLATELET
Abs Immature Granulocytes: 0.07 10*3/uL (ref 0.00–0.07)
Basophils Absolute: 0 10*3/uL (ref 0.0–0.1)
Basophils Relative: 0 %
Eosinophils Absolute: 0 10*3/uL (ref 0.0–0.5)
Eosinophils Relative: 0 %
HCT: 31.4 % — ABNORMAL LOW (ref 39.0–52.0)
Hemoglobin: 9.9 g/dL — ABNORMAL LOW (ref 13.0–17.0)
Immature Granulocytes: 1 %
Lymphocytes Relative: 6 %
Lymphs Abs: 0.6 10*3/uL — ABNORMAL LOW (ref 0.7–4.0)
MCH: 27.8 pg (ref 26.0–34.0)
MCHC: 31.5 g/dL (ref 30.0–36.0)
MCV: 88.2 fL (ref 80.0–100.0)
Monocytes Absolute: 0.5 10*3/uL (ref 0.1–1.0)
Monocytes Relative: 5 %
Neutro Abs: 10.1 10*3/uL — ABNORMAL HIGH (ref 1.7–7.7)
Neutrophils Relative %: 88 %
Platelets: 398 10*3/uL (ref 150–400)
RBC: 3.56 MIL/uL — ABNORMAL LOW (ref 4.22–5.81)
RDW: 15.5 % (ref 11.5–15.5)
WBC: 11.3 10*3/uL — ABNORMAL HIGH (ref 4.0–10.5)
nRBC: 0 % (ref 0.0–0.2)

## 2019-02-11 LAB — MAGNESIUM: Magnesium: 1.8 mg/dL (ref 1.7–2.4)

## 2019-02-11 LAB — GLUCOSE, CAPILLARY
Glucose-Capillary: 235 mg/dL — ABNORMAL HIGH (ref 70–99)
Glucose-Capillary: 400 mg/dL — ABNORMAL HIGH (ref 70–99)
Glucose-Capillary: 449 mg/dL — ABNORMAL HIGH (ref 70–99)
Glucose-Capillary: 145 mg/dL — ABNORMAL HIGH (ref 70–99)

## 2019-02-11 LAB — COMPREHENSIVE METABOLIC PANEL
ALT: 38 U/L (ref 0–44)
AST: 42 U/L — ABNORMAL HIGH (ref 15–41)
Albumin: 2.7 g/dL — ABNORMAL LOW (ref 3.5–5.0)
Alkaline Phosphatase: 120 U/L (ref 38–126)
Anion gap: 8 (ref 5–15)
BUN: 41 mg/dL — ABNORMAL HIGH (ref 8–23)
CO2: 27 mmol/L (ref 22–32)
Calcium: 8.5 mg/dL — ABNORMAL LOW (ref 8.9–10.3)
Chloride: 102 mmol/L (ref 98–111)
Creatinine, Ser: 1.13 mg/dL (ref 0.61–1.24)
GFR calc Af Amer: >60 mL/min (ref >60)
GFR calc non Af Amer: >60 mL/min (ref >60)
Glucose, Bld: 253 mg/dL — ABNORMAL HIGH (ref 70–99)
Potassium: 4.2 mmol/L (ref 3.5–5.1)
Sodium: 137 mmol/L (ref 135–145)
Total Bilirubin: 0.5 mg/dL (ref 0.3–1.2)
Total Protein: 6.4 g/dL — ABNORMAL LOW (ref 6.5–8.1)

## 2019-02-11 MED ORDER — SODIUM CHLORIDE 0.9 % IV SOLN
100.0000 mg | INTRAVENOUS | Status: AC
  Administered 2019-02-12: 100 mg via INTRAVENOUS
  Filled 2019-02-11: qty 20

## 2019-02-11 MED ORDER — INSULIN ASPART 100 UNIT/ML ~~LOC~~ SOLN
24.0000 [IU] | Freq: Once | SUBCUTANEOUS | Status: AC
  Administered 2019-02-11: 17:00:00 24 [IU] via SUBCUTANEOUS

## 2019-02-11 MED ORDER — METOPROLOL TARTRATE 50 MG PO TABS
50.0000 mg | ORAL_TABLET | Freq: Two times a day (BID) | ORAL | Status: DC
  Administered 2019-02-11 – 2019-02-12 (×2): 50 mg via ORAL
  Filled 2019-02-11 (×2): qty 1

## 2019-02-11 MED ORDER — LORAZEPAM 2 MG/ML IJ SOLN
1.0000 mg | Freq: Once | INTRAMUSCULAR | Status: AC
  Administered 2019-02-12: 1 mg via INTRAVENOUS
  Filled 2019-02-11: qty 1

## 2019-02-11 MED ORDER — ISOSORBIDE MONONITRATE ER 30 MG PO TB24
30.0000 mg | ORAL_TABLET | Freq: Once | ORAL | Status: AC
  Administered 2019-02-11: 12:00:00 30 mg via ORAL
  Filled 2019-02-11: qty 1

## 2019-02-11 MED ORDER — DEXAMETHASONE 4 MG PO TABS
4.0000 mg | ORAL_TABLET | Freq: Every day | ORAL | Status: DC
  Administered 2019-02-11 – 2019-02-12 (×2): 4 mg via ORAL
  Filled 2019-02-11 (×2): qty 1

## 2019-02-11 MED ORDER — ISOSORBIDE MONONITRATE ER 30 MG PO TB24
60.0000 mg | ORAL_TABLET | Freq: Every day | ORAL | Status: DC
  Administered 2019-02-12 – 2019-02-19 (×8): 60 mg via ORAL
  Filled 2019-02-11 (×8): qty 2

## 2019-02-11 NOTE — Progress Notes (Addendum)
PROGRESS NOTE  Kevis Ottaviani [id number] DOB: [birth year] DOA: 02/08/2019  PCP: Corey Skains, MD  Brief History/Interval Summary: 84 y.o. male with a past medical history of chronic systolic CHF with a EF of 45 to 50%, atrial fibrillation on anticoagulation, insulin-dependent diabetes mellitus, essential hypertension, peripheral vascular disease, recent right below-knee amputation, previous left below-knee amputation, history of prostate cancer, history of sleep apnea on CPAP who recently came home from a skilled nursing facility a few days prior to this admission.  He was at the rehab facility after he underwent right below-knee amputation last month.  He was doing well and then the day prior to admission patient started developing shortness of breath.  Symptoms worsened during the nighttime so he had his wife called EMS.  He was taken to the emergency department at [facility]. In the emergency department patient was found to have right lung pneumonia.  He was noted to be hypoxic.  Initially placed on BiPAP.  Patient was hospitalized.  Subsequently the COVID-19 test came back positive.  He was subsequently sent over to the [place] for further management.   Reason for Visit: Pneumonia secondary to COVID-19  Consultants: None  Procedures: None  Antibiotics: Anti-infectives (From admission, onward)   Start     Dose/Rate Route Frequency Ordered Stop   02/09/19 2000  remdesivir 100 mg in sodium chloride 0.9 % 250 mL IVPB  Status:  Discontinued     100 mg 500 mL/hr over 30 Minutes Intravenous Every 24 hours 02/08/19 1909 02/09/19 1303   02/09/19 1600  remdesivir 100 mg in sodium chloride 0.9 % 250 mL IVPB     100 mg 500 mL/hr over 30 Minutes Intravenous Every 24 hours 02/09/19 1303 02/13/19 1559   02/09/19 1000  cefTRIAXone (ROCEPHIN) 1 g in sodium chloride 0.9 % 100 mL IVPB     1 g 200 mL/hr over 30 Minutes Intravenous Every 24 hours 02/09/19 0949 02/13/19 0959    02/09/19 1000  azithromycin (ZITHROMAX) tablet 500 mg     500 mg Oral Daily 02/09/19 0949 02/13/19 0959   02/08/19 2200  sulfamethoxazole-trimethoprim (BACTRIM) 400-80 MG per tablet 1 tablet  Status:  Discontinued     1 tablet Oral 2 times daily 02/08/19 1637 02/08/19 1701   02/08/19 2200  sulfamethoxazole-trimethoprim (BACTRIM) 400-80 MG per tablet 1 tablet  Status:  Discontinued     1 tablet Oral 2 times daily 02/08/19 1701 02/09/19 0949   02/08/19 2000  remdesivir 200 mg in sodium chloride 0.9 % 250 mL IVPB     200 mg 500 mL/hr over 30 Minutes Intravenous Once 02/08/19 1909 02/09/19 0500       Subjective/Interval History: Patient states that he slept reasonably well overnight.  No further episodes of chest pain.  No shortness of breath this morning.  Cough is improving.      Assessment/Plan:  Acute Hypoxic Resp. Failure due to Acute Covid 19 Viral Illness/pneumonia secondary to COVID-19/sepsis present on admission  COVID-19 Labs  Recent Labs    02/08/19 1707 02/09/19 0258 02/10/19 0150  DDIMER  --  1.25* 0.98*  FERRITIN 69 42 60  CRP 1.6* 1.8* 1.5*    Lab Results  Component Value Date   SARSCOV2NAA POSITIVE (A) 02/08/2019   [place] NEGATIVE 01/05/2019   [place] NEGATIVE 12/20/2018   [place] NEGATIVE 12/15/2018     Fever: Remains afebrile Oxygen requirements: Noted to be on room air this morning.  Saturating in the early 90s.   Antibacterials:  Ceftriaxone and azithromycin day 4 today Remdesivir: Day 4 today Steroids: Dexamethasone 6 mg daily Diuretics: Not on a scheduled basis Actemra: Not indicated yet Vitamin C and Zinc: Continue  DVT Prophylaxis: On rivaroxaban at home which is being continued  Patient's respiratory status appears to be improving.  He is now saturating in the early 90s on room air.  Continue steroids and Remdesivir.  He will complete course tomorrow.  He is also on antibacterials as his procalcitonin level was noted to be elevated  at 0.54.  Inflammatory markers are stable.  He will complete 5 days of antibiotics tomorrow as well.  Incentive spirometry.  Stable angina in the setting of known coronary artery disease Patient had anginal chest pain on 9/18 relieved with nitroglycerin.  Minimal elevation in troponin noted.  Discussed with cardiology as patient did have some EKG changes.  Appreciate their input.  Please review the note from 9/18.  Patient's beta-blocker dose was increased.  He was started on Imdur.  With these his symptoms have resolved.  No further interventions at this time.  Continue aspirin.  Patient is anticoagulated with rivaroxaban.  Patient followed by cardiology in [facility].  Patient had a cardiac catheterization earlier this year.  Was found to have significant disease.  Specifically RCA lesion of 75% distal LM to ostial LAD of 100% and proximal circumflex of 50%. EF was found to be 35% at that time.  Intervention was deferred in favor of aggressive medical therapy.  Noted to be bradycardic overnight with heart rate in the 40s.  We will cut back on the dose of metoprolol.  Recent right BKA with stump wound Followed by vascular surgery in [place], Dr. Lucky Cowboy.  Still has staples in the wound.  He was last seen in their office on September 8.  Had an appointment on 9/18 but he was in the hospital.  This will be rescheduled for next week.  He was placed on Bactrim at the office at the last visit.  According to the son patient has finished a course.  So Bactrim was discontinued.  Wound care nurse has seen the patient.  Continue with dressing changes and silver sulfadiazine cream.   Mild acute renal failure Baseline creatinine around 1.1.  1.3 at admission.  Improved.  Continue to monitor urine output.  Potassium is normal.    Chronic systolic CHF Echocardiogram from earlier this year showed a EF of 45 to 50%.  Ejection fraction with cardiac catheterization noted to be around 35%.  BNP was elevated  in the ED.  Patient was given Lasix in the ED. remains euvolemic.  Continue to monitor clinically.    Essential hypertension Blood pressure remains poorly controlled.  This could have been due to agitation overnight.  Continue with the current medication regimen for now.  Could consider additional agents or increase the dose of Imdur.  History of paroxysmal atrial fibrillation Continue with diltiazem and metoprolol.  Rate is well controlled.  He is on rivaroxaban for stroke prevention.  Diabetes mellitus type 2, uncontrolled with hyperglycemia Elevated CBG most likely due to steroids.  HbA1c 8.9 in July.  Patient is on Lantus and SSI.  CBG should improve as steroid is tapered down.Marland Kitchen    Hyperlipidemia Continue statin.  Normocytic anemia Drop in hemoglobin likely dilutional.  No overt bleeding noted.  Baseline hemoglobin between 8 and 9.  He was likely hemoconcentrated to begin with.  History of obstructive sleep apnea Cannot use CPAP in this facility.  Continue  with oxygen.  Pressure ulcer As per wound care nurse. Pressure Injury 02/08/19 Buttocks Right;Lower Stage II -  Partial thickness loss of dermis presenting as a shallow open ulcer with a red, pink wound bed without slough. (Active)  02/08/19 1610  Location: Buttocks  Location Orientation: Right;Lower  Staging: Stage II -  Partial thickness loss of dermis presenting as a shallow open ulcer with a red, pink wound bed without slough.  Wound Description (Comments):   Present on Admission: Yes     Pressure Injury 02/08/19 Scrotum Posterior;Medial Stage II -  Partial thickness loss of dermis presenting as a shallow open ulcer with a red, pink wound bed without slough. (Active)  02/08/19 1611  Location: Scrotum  Location Orientation: Posterior;Medial  Staging: Stage II -  Partial thickness loss of dermis presenting as a shallow open ulcer with a red, pink wound bed without slough.  Wound Description (Comments):   Present on  Admission: Yes     DVT Prophylaxis: On rivaroxaban PUD Prophylaxis: Pepcid Code Status: Full code Family Communication: Discussed with patient.  Son being updated on a daily basis. Disposition Plan: Overnight the nursing staff raise concern regarding him going home.  Will discuss with family members.  Of note he did come home from a skilled nursing facility recently.   Medications:  Scheduled: . amiodarone  200 mg Oral Daily  . aspirin EC  81 mg Oral Daily  . atorvastatin  80 mg Oral QPM  . azithromycin  500 mg Oral Daily  . dexamethasone (DECADRON) injection  6 mg Intravenous Q24H  . diltiazem  120 mg Oral Daily  . famotidine  20 mg Oral Daily  . feeding supplement (ENSURE ENLIVE)  237 mL Oral TID BM  . ferrous sulfate  325 mg Oral Q breakfast  . insulin aspart  0-20 Units Subcutaneous TID WC  . insulin aspart  0-5 Units Subcutaneous QHS  . insulin glargine  15 Units Subcutaneous BID  . isosorbide mononitrate  30 mg Oral Daily  . mouth rinse  15 mL Mouth Rinse BID  . metoprolol tartrate  50 mg Oral BID  . multivitamin with minerals  1 tablet Oral Daily  . polyethylene glycol  17 g Oral BID  . Rivaroxaban  15 mg Oral Q supper  . senna  2 tablet Oral QHS  . silver sulfADIAZINE  1 application Topical BID  . sodium chloride flush  3 mL Intravenous Q12H  . sodium chloride flush  3 mL Intravenous Q12H  . ascorbic acid  500 mg Oral Daily  . zinc sulfate  220 mg Oral Daily   Continuous: . sodium chloride    . cefTRIAXone (ROCEPHIN)  IV 1 g (02/11/19 0921)  . remdesivir 100 mg in NS 250 mL Stopped (02/10/19 1630)   [id number] chloride, acetaminophen, bisacodyl, chlorpheniramine-HYDROcodone, guaiFENesin-dextromethorphan, haloperidol lactate, haloperidol lactate, HYDROcodone-acetaminophen, nitroGLYCERIN, ondansetron **OR** ondansetron (ZOFRAN) IV, sodium chloride flush   Objective:  Vital Signs  Vitals:   02/11/19 0400 02/11/19 0500 02/11/19 0806 02/11/19 0905  BP:   (!)  179/67 (!) 168/72  Pulse: (!) 49 (!) 51 (!) 53 64  Resp:   (!) 21   Temp:   98.1 F (36.7 C)   TempSrc:   Oral   SpO2: 92% 93% 93%   Weight:      Height:        Intake/Output Summary (Last 24 hours) at 02/11/2019 1018 Last data filed at 02/11/2019 0547 Gross per 24 hour  Intake 836 ml  Output 1325 ml  Net -489 ml   Filed Weights   02/08/19 1548  Weight: 78.3 kg    General appearance: Awake alert.  In no distress.  Mildly distracted Resp: Normal effort at rest.  Coarse breath sounds bilaterally.  No wheezing or rhonchi.  Few crackles at the bases.   Cardio: S1-S2 is normal regular.  No S3-S4.  No rubs murmurs or bruit GI: Abdomen is soft.  Nontender nondistended.  Bowel sounds are present normal.  No masses organomegaly Extremities: Bilateral BKA.  Dressing noted over the right stump. Neurologic: He is awake alert.  Mildly distracted.  Oriented to place, city, year, person, month.  No obvious deficits noted.     Lab Results:  Data Reviewed: I have personally reviewed following labs and imaging studies  CBC: Recent Labs  Lab 02/08/19 0556 02/09/19 0258 02/10/19 0150 02/11/19 0700  WBC 17.5* 7.1 9.0 11.3*  NEUTROABS 15.0* 6.7 8.4* 10.1*  HGB 10.0* 9.0* 8.5* 9.9*  HCT 31.8* 29.5* 27.1* 31.4*  MCV 88.6 89.7 88.9 88.2  PLT 410* 355 375 [id number]    Basic Metabolic Panel: Recent Labs  Lab 02/08/19 0556 02/09/19 0258 02/09/19 1356 02/10/19 0150 02/11/19 0700  NA 135 138  --  136 137  K 4.2 5.3* 4.2 4.6 4.2  CL 105 104  --  104 102  CO2 21* 26  --  24 27  GLUCOSE 265* 313*  --  299* 253*  BUN 36* 38*  --  43* 41*  CREATININE 1.38* 1.33*  --  1.08 1.13  CALCIUM 8.1* 8.4*  --  8.4* 8.5*  MG  --  1.8  --  1.8 1.8    GFR: Estimated Creatinine Clearance: 47.9 mL/min (by C-G formula based on SCr of 1.13 mg/dL).  Liver Function Tests: Recent Labs  Lab 02/08/19 1707 02/09/19 0258 02/10/19 0150 02/11/19 0700  AST 27 28 30  42*  ALT 19 22 23  38  ALKPHOS 131* 123  123 120  BILITOT 0.5 0.7 0.7 0.5  PROT 6.6 6.5 6.3* 6.4*  ALBUMIN 2.6* 2.5* 2.6* 2.7*     Coagulation Profile: Recent Labs  Lab 02/08/19 0557  INR 3.0*     CBG: Recent Labs  Lab 02/10/19 0721 02/10/19 1127 02/10/19 1548 02/10/19 2113 02/11/19 0737  GLUCAP 238* 372* 248* 344* 235*     Anemia Panel: Recent Labs    02/09/19 0258 02/10/19 0150  FERRITIN 42 60    Recent Results (from the past 240 hour(s))  Blood Culture (routine x 2)     Status: None (Preliminary result)   Collection Time: 02/08/19  5:57 AM   Specimen: BLOOD  Result Value Ref Range Status   Specimen Description BLOOD BLOOD LEFT HAND  Final   Special Requests   Final    BOTTLES DRAWN AEROBIC AND ANAEROBIC Blood Culture adequate volume   Culture   Final    NO GROWTH 3 DAYS Performed at [facility], [address], [place], [place] [address]    Report Status PENDING  Incomplete  Blood Culture (routine x 2)     Status: None (Preliminary result)   Collection Time: 02/08/19  5:57 AM   Specimen: BLOOD  Result Value Ref Range Status   Specimen Description BLOOD RIGHT WRIST  Final   Special Requests   Final    BOTTLES DRAWN AEROBIC AND ANAEROBIC Blood Culture results may not be optimal due to an excessive volume of blood received in culture bottles   Culture  Final    NO GROWTH 3 DAYS Performed at [facility], [address] [place], [place] [address]    Report Status PENDING  Incomplete  SARS Coronavirus 2 [facility] order, Performed in [facility] hospital lab) Nasopharyngeal Nasopharyngeal Swab     Status: Abnormal   Collection Time: 02/08/19  6:01 AM   Specimen: Nasopharyngeal Swab  Result Value Ref Range Status   SARS Coronavirus 2 POSITIVE (A) NEGATIVE Final    Comment: RESULT CALLED TO, READ BACK BY AND VERIFIED WITH:  LAURA CATES AT 0735 02/08/2019 SDR (NOTE) If result is NEGATIVE SARS-CoV-2 target nucleic acids are NOT DETECTED. The SARS-CoV-2 RNA is generally  detectable in upper and lower  respiratory specimens during the acute phase of infection. The lowest  concentration of SARS-CoV-2 viral copies this assay can detect is 250  copies / mL. A negative result does not preclude SARS-CoV-2 infection  and should not be used as the sole basis for treatment or other  patient management decisions.  A negative result may occur with  improper specimen collection / handling, submission of specimen other  than nasopharyngeal swab, presence of viral mutation(s) within the  areas targeted by this assay, and inadequate number of viral copies  (<250 copies / mL). A negative result must be combined with clinical  observations, patient history, and epidemiological information. If result is POSITIVE SARS-CoV-2 target nucleic acids are DETECTED. Th e SARS-CoV-2 RNA is generally detectable in upper and lower  respiratory specimens during the acute phase of infection.  Positive  results are indicative of active infection with SARS-CoV-2.  Clinical  correlation with patient history and other diagnostic information is  necessary to determine patient infection status.  Positive results do  not rule out bacterial infection or co-infection with other viruses. If result is PRESUMPTIVE POSTIVE SARS-CoV-2 nucleic acids MAY BE PRESENT.   A presumptive positive result was obtained on the submitted specimen  and confirmed on repeat testing.  While 2019 novel coronavirus  (SARS-CoV-2) nucleic acids may be present in the submitted sample  additional confirmatory testing may be necessary for epidemiological  and / or clinical management purposes  to differentiate between  SARS-CoV-2 and other Sarbecovirus currently known to infect humans.  If clinically indicated additional testing with an alternate test  methodology [phone]) is  advised. The SARS-CoV-2 RNA is generally  detectable in upper and lower respiratory specimens during the acute  phase of infection. The  expected result is Negative. Fact Sheet for Patients:  StrictlyIdeas.no Fact Sheet for Healthcare Providers: BankingDealers.co.za This test is not yet approved or cleared by the [place] FDA and has been authorized for detection and/or diagnosis of SARS-CoV-2 by FDA under an Emergency Use Authorization (EUA).  This EUA will remain in effect (meaning this test can be used) for the duration of the COVID-19 declaration under Section 564(b)(1) of the Act, 21 U.S.C. section 360bbb-3(b)(1), unless the authorization is terminated or revoked sooner. Performed at [facility], [address] [place], [place] [address]   MRSA PCR Screening     Status: None   Collection Time: 02/08/19  6:45 PM   Specimen: Nasal Mucosa; Nasopharyngeal  Result Value Ref Range Status   MRSA by PCR NEGATIVE NEGATIVE Final    Comment:        The GeneXpert MRSA Assay (FDA approved for NASAL specimens only), is one component of a comprehensive MRSA colonization surveillance program. It is not intended to diagnose MRSA infection nor to guide or monitor  treatment for MRSA infections. Performed at [facility], [place] [address], [place], [place] [address]       Radiology Studies: No results found.     LOS: 3 days   Charmika Macdonnell [employer] on [email or web address]  02/11/2019, 10:18 AM

## 2019-02-11 NOTE — Progress Notes (Signed)
Patient demanded to talk to his son, [name].  Son was called.  I briefed him on th events of the night.  He talked with his father and told him that he needed to start cooperating with staff.  He told patient if he did not improve his behavior, they would have to put him in a nursing home.  He told the patient that he loved him but he needed to do what was best for his father and for his mother.  Patient seemed more alert and oriented.  He told his son that he would cooperate.  I talked to son and expressed concerns about his mother being able to care for patient at home.  He stated his mother is 19 years of age and in the early stages of Alzheimer.  I told him I would be patient's nurse tonight and would call him with an update on father's condition by 2300 hours.  Earleen Reaper RN
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
## 2019-02-12 DIAGNOSIS — R410 Disorientation, unspecified: Secondary | ICD-10-CM

## 2019-02-12 LAB — CBC WITH DIFFERENTIAL/PLATELET
Abs Immature Granulocytes: 0.19 10*3/uL — ABNORMAL HIGH (ref 0.00–0.07)
Basophils Absolute: 0 10*3/uL (ref 0.0–0.1)
Basophils Relative: 0 %
Eosinophils Absolute: 0 10*3/uL (ref 0.0–0.5)
Eosinophils Relative: 0 %
HCT: 30.6 % — ABNORMAL LOW (ref 39.0–52.0)
Hemoglobin: 9.5 g/dL — ABNORMAL LOW (ref 13.0–17.0)
Immature Granulocytes: 1 %
Lymphocytes Relative: 7 %
Lymphs Abs: 0.9 10*3/uL (ref 0.7–4.0)
MCH: 27.5 pg (ref 26.0–34.0)
MCHC: 31 g/dL (ref 30.0–36.0)
MCV: 88.7 fL (ref 80.0–100.0)
Monocytes Absolute: 0.8 10*3/uL (ref 0.1–1.0)
Monocytes Relative: 6 %
Neutro Abs: 12.1 10*3/uL — ABNORMAL HIGH (ref 1.7–7.7)
Neutrophils Relative %: 86 %
Platelets: 378 10*3/uL (ref 150–400)
RBC: 3.45 MIL/uL — ABNORMAL LOW (ref 4.22–5.81)
RDW: 15.5 % (ref 11.5–15.5)
WBC: 14 10*3/uL — ABNORMAL HIGH (ref 4.0–10.5)
nRBC: 0.1 % (ref 0.0–0.2)

## 2019-02-12 LAB — COMPREHENSIVE METABOLIC PANEL
ALT: 38 U/L (ref 0–44)
AST: 36 U/L (ref 15–41)
Albumin: 2.4 g/dL — ABNORMAL LOW (ref 3.5–5.0)
Alkaline Phosphatase: 98 U/L (ref 38–126)
Anion gap: 9 (ref 5–15)
BUN: 43 mg/dL — ABNORMAL HIGH (ref 8–23)
CO2: 26 mmol/L (ref 22–32)
Calcium: 8.4 mg/dL — ABNORMAL LOW (ref 8.9–10.3)
Chloride: 103 mmol/L (ref 98–111)
Creatinine, Ser: 1.03 mg/dL (ref 0.61–1.24)
GFR calc Af Amer: >60 mL/min (ref >60)
GFR calc non Af Amer: >60 mL/min (ref >60)
Glucose, Bld: 227 mg/dL — ABNORMAL HIGH (ref 70–99)
Potassium: 4.6 mmol/L (ref 3.5–5.1)
Sodium: 138 mmol/L (ref 135–145)
Total Bilirubin: 0.5 mg/dL (ref 0.3–1.2)
Total Protein: 5.9 g/dL — ABNORMAL LOW (ref 6.5–8.1)

## 2019-02-12 LAB — MAGNESIUM: Magnesium: 1.9 mg/dL (ref 1.7–2.4)

## 2019-02-12 LAB — GLUCOSE, CAPILLARY
Glucose-Capillary: 217 mg/dL — ABNORMAL HIGH (ref 70–99)
Glucose-Capillary: 167 mg/dL — ABNORMAL HIGH (ref 70–99)
Glucose-Capillary: 96 mg/dL (ref 70–99)
Glucose-Capillary: 80 mg/dL (ref 70–99)

## 2019-02-12 MED ORDER — DEXAMETHASONE 4 MG PO TABS
2.0000 mg | ORAL_TABLET | Freq: Every day | ORAL | Status: DC
  Administered 2019-02-13 – 2019-02-14 (×2): 2 mg via ORAL
  Filled 2019-02-12 (×2): qty 1

## 2019-02-12 MED ORDER — METOPROLOL TARTRATE 25 MG PO TABS
25.0000 mg | ORAL_TABLET | Freq: Three times a day (TID) | ORAL | Status: DC
  Administered 2019-02-13 – 2019-02-19 (×10): 25 mg via ORAL
  Filled 2019-02-12 (×18): qty 1

## 2019-02-12 MED ORDER — INSULIN GLARGINE 100 UNIT/ML ~~LOC~~ SOLN
18.0000 [IU] | Freq: Two times a day (BID) | SUBCUTANEOUS | Status: DC
  Administered 2019-02-12: 23:00:00 18 [IU] via SUBCUTANEOUS
  Filled 2019-02-12 (×2): qty 0.18

## 2019-02-12 MED ORDER — QUETIAPINE FUMARATE 25 MG PO TABS
50.0000 mg | ORAL_TABLET | Freq: Two times a day (BID) | ORAL | Status: DC
  Administered 2019-02-12 – 2019-02-13 (×3): 50 mg via ORAL
  Filled 2019-02-12 (×4): qty 2

## 2019-02-12 MED ORDER — HYDRALAZINE HCL 20 MG/ML IJ SOLN: 10.0000 mg | Freq: Four times a day (QID) | INTRAMUSCULAR | Status: DC | PRN

## 2019-02-12 NOTE — Progress Notes (Signed)
Inpatient Diabetes Program Recommendations  AACE/ADA: New Consensus Statement on Inpatient Glycemic Control (2015)  Target Ranges:  Prepandial:   less than 140 mg/dL      Peak postprandial:   less than 180 mg/dL (1-2 hours)      Critically ill patients:  140 - 180 mg/dL   Results for [name], [name] (MRN [id number]) as of 02/12/2019 08:38  Ref. Range 02/11/2019 07:37 02/11/2019 12:07 02/11/2019 16:41 02/11/2019 21:44  Glucose-Capillary Latest Ref Range: 70 - 99 mg/dL 235 (H)  7 units NOVOLOG +  15 units LANTUS given at 9am  400 (H)  20 units NOVOLOG  449 (H)  24 units NOVOLOG  145 (H)     15 units LANTUS given at 11pm   Results for [name], [name] (MRN [id number]) as of 02/12/2019 08:38  Ref. Range 02/12/2019 07:50  Glucose-Capillary Latest Ref Range: 70 - 99 mg/dL 217 (H)     Home DM Meds: Lantus 15 units QHS       Humalog 3 units TID with meals       Humalog 1 unit for every 50 mg/dl >200 mg/dl   Current Orders: Lantus 15 units BID      Novolog Resistant Correction Scale/ SSI (0-20 units) TID AC + HS    Getting Decadron 4 mg Daily.    MD- Please consider the following in-hospital insulin adjustments:  1. Increase Lantus slightly to 17 units BID (fasting CBG still elevated this AM)   2. Start Novolog Meal Coverage: Novolog 6 units TID with meals  (Please add the following Hold Parameters: Hold if pt eats <50% of meal, Hold if pt NPO)      --Will follow patient during hospitalization--  Wyn Quaker RN, MSN, CDE Diabetes Coordinator Inpatient Glycemic Control Team Team Pager: [phone] (8a-5p)

## 2019-02-12 NOTE — Progress Notes (Addendum)
PROGRESS NOTE  [name] [id number] DOB: [birth year] DOA: 02/08/2019  PCP: Corey Skains, MD  Brief History/Interval Summary: 84 y.o. male with a past medical history of chronic systolic CHF with a EF of 45 to 50%, atrial fibrillation on anticoagulation, insulin-dependent diabetes mellitus, essential hypertension, peripheral vascular disease, recent right below-knee amputation, previous left below-knee amputation, history of prostate cancer, history of sleep apnea on CPAP who recently came home from a skilled nursing facility a few days prior to this admission.  He was at the rehab facility after he underwent right below-knee amputation last month.  He was doing well and then the day prior to admission patient started developing shortness of breath.  Symptoms worsened during the nighttime so he had his wife called EMS.  He was taken to the emergency department at [facility]. In the emergency department patient was found to have right lung pneumonia.  He was noted to be hypoxic.  Initially placed on BiPAP.  Patient was hospitalized.  Subsequently the COVID-19 test came back positive.  He was subsequently sent over to the [facility] for further management.   Reason for Visit: Pneumonia secondary to COVID-19  Consultants: None  Procedures: None  Antibiotics: Anti-infectives (From admission, onward)   Start     Dose/Rate Route Frequency Ordered Stop   02/12/19 1000  remdesivir 100 mg in sodium chloride 0.9 % 250 mL IVPB     100 mg 500 mL/hr over 30 Minutes Intravenous Every 24 hours 02/11/19 2004 02/13/19 0959   02/09/19 2000  remdesivir 100 mg in sodium chloride 0.9 % 250 mL IVPB  Status:  Discontinued     100 mg 500 mL/hr over 30 Minutes Intravenous Every 24 hours 02/08/19 1909 02/09/19 1303   02/09/19 1600  remdesivir 100 mg in sodium chloride 0.9 % 250 mL IVPB  Status:  Discontinued     100 mg 500 mL/hr over 30 Minutes Intravenous Every 24 hours 02/09/19 1303  02/11/19 2004   02/09/19 1000  cefTRIAXone (ROCEPHIN) 1 g in sodium chloride 0.9 % 100 mL IVPB     1 g 200 mL/hr over 30 Minutes Intravenous Every 24 hours 02/09/19 0949 02/12/19 0943   02/09/19 1000  azithromycin (ZITHROMAX) tablet 500 mg     500 mg Oral Daily 02/09/19 0949 02/12/19 0908   02/08/19 2200  sulfamethoxazole-trimethoprim (BACTRIM) 400-80 MG per tablet 1 tablet  Status:  Discontinued     1 tablet Oral 2 times daily 02/08/19 1637 02/08/19 1701   02/08/19 2200  sulfamethoxazole-trimethoprim (BACTRIM) 400-80 MG per tablet 1 tablet  Status:  Discontinued     1 tablet Oral 2 times daily 02/08/19 1701 02/09/19 0949   02/08/19 2000  remdesivir 200 mg in sodium chloride 0.9 % 250 mL IVPB     200 mg 500 mL/hr over 30 Minutes Intravenous Once 02/08/19 1909 02/09/19 0500       Subjective/Interval History: Overnight events noted.  Patient apparently got very agitated swinging at the staff.  This morning he is noted to be sleepy.  Arousable but cantankerous.  He did get Ativan during the course of the night.      Assessment/Plan:  Acute Hypoxic Resp. Failure due to Acute Covid 19 Viral Illness/pneumonia secondary to COVID-19/sepsis present on admission  COVID-19 Labs  Recent Labs    02/10/19 0150  DDIMER 0.98*  FERRITIN 60  CRP 1.5*    Lab Results  Component Value Date   SARSCOV2NAA POSITIVE (A) 02/08/2019   SARSCOV2NAA  NEGATIVE 01/05/2019   SARSCOV2NAA NEGATIVE 12/20/2018   SARSCOV2NAA NEGATIVE 12/15/2018     Fever: Remains afebrile Oxygen requirements: Occasionally requiring 1 L of oxygen by nasal cannula.  Saturating in the 90s.    Antibacterials: Ceftriaxone and azithromycin day 5 today Remdesivir: Day 5 today Steroids: Dexamethasone 6 mg daily Diuretics: Not on a scheduled basis Actemra: Not indicated yet Vitamin C and Zinc: Continue  DVT Prophylaxis: On rivaroxaban at home which is being continued  Patient's respiratory status is stable.  He is requiring  minimal amount of oxygen.  He will complete course of Remdesivir today.  He will also complete course of antibacterials.  Inflammatory markers had improved.  Continue with incentive spirometry.  Leukocytosis is most likely due to steroids.  Acute metabolic encephalopathy/hospital-acquired delirium Patient noted to be delirious over the course of the night.  This is most likely due to hospital stay, acute illness, steroids.  He was given Haldol and Ativan overnight.  He will be placed on Seroquel.  QTC was 470 when last checked.  Reorient daily.  Stable angina in the setting of known coronary artery disease Patient had anginal chest pain on 9/18 relieved with nitroglycerin.  Minimal elevation in troponin noted.  Discussed with cardiology as patient did have some EKG changes.  Appreciate their input.  Please review the note from 9/18.  Patient's beta-blocker dose was increased.  He was started on Imdur.  With these his symptoms have resolved.  No further interventions at this time.  Continue aspirin.  Patient is anticoagulated with rivaroxaban.  Patient followed by cardiology in [facility].  Patient had a cardiac catheterization earlier this year.  Was found to have significant disease.  Specifically RCA lesion of 75% distal LM to ostial LAD of 100% and proximal circumflex of 50%. EF was found to be 35% at that time.  Intervention was deferred in favor of aggressive medical therapy.  Dose of beta-blocker decreased due to bradycardia.  Recent right BKA with stump wound Followed by vascular surgery in [place], Dr. Lucky Cowboy.  Still has staples in the wound.  He was last seen in their office on September 8.  Had an appointment on 9/18 but he was in the hospital.  This will be rescheduled for next week.  He was placed on Bactrim at the office at the last visit.  According to the son patient has finished a course.  So Bactrim was discontinued.  Wound care nurse has seen the patient.  Continue with  dressing changes and silver sulfadiazine cream.   Mild acute renal failure/CKD #3 Baseline creatinine around 1.1.  It was 1.3 at admission.  Renal function has improved.  Potassium is normal.  Continue to monitor urine output.  Chronic systolic CHF Echocardiogram from earlier this year showed a EF of 45 to 50%.  Ejection fraction with cardiac catheterization noted to be around 35%.  BNP was elevated in the ED.  Patient was given Lasix in the ED. remains euvolemic.  Continue to monitor clinically.    Essential hypertension Elevated blood pressure most likely due to agitation.  Continue with current antihypertensive regimen for now.  Imdur dose was increased recently.  History of paroxysmal atrial fibrillation Continue with diltiazem and metoprolol.  Rate is well controlled.  He is on rivaroxaban for stroke prevention.  Diabetes mellitus type 2, uncontrolled with hyperglycemia Elevated CBG most likely due to steroids.  HbA1c 8.9 in July.  Patient is on Lantus and SSI.  Taper steroids.  CBG should  improve.  Adjust dose of Lantus.    Hyperlipidemia Continue statin.  Normocytic anemia Drop in hemoglobin likely dilutional.  No overt bleeding noted.  Baseline hemoglobin between 8 and 9.  He was likely hemoconcentrated to begin with.  Hemoglobin remains stable.  History of obstructive sleep apnea Cannot use CPAP in this facility.  Continue with oxygen.  Pressure ulcer As per wound care nurse. Pressure Injury 02/08/19 Buttocks Right;Lower Stage II -  Partial thickness loss of dermis presenting as a shallow open ulcer with a red, pink wound bed without slough. (Active)  02/08/19 1610  Location: Buttocks  Location Orientation: Right;Lower  Staging: Stage II -  Partial thickness loss of dermis presenting as a shallow open ulcer with a red, pink wound bed without slough.  Wound Description (Comments):   Present on Admission: Yes     Pressure Injury 02/08/19 Scrotum Posterior;Medial Stage II -   Partial thickness loss of dermis presenting as a shallow open ulcer with a red, pink wound bed without slough. (Active)  02/08/19 1611  Location: Scrotum  Location Orientation: Posterior;Medial  Staging: Stage II -  Partial thickness loss of dermis presenting as a shallow open ulcer with a red, pink wound bed without slough.  Wound Description (Comments):   Present on Admission: Yes     DVT Prophylaxis: On rivaroxaban PUD Prophylaxis: Pepcid Code Status: Full code Family Communication: Son being updated on a daily basis. Disposition Plan: Unfortunately patient is now delirious.  Not stable to be discharged.  Will discuss with son.     Medications:  Scheduled: . amiodarone  200 mg Oral Daily  . aspirin EC  81 mg Oral Daily  . atorvastatin  80 mg Oral QPM  . dexamethasone  4 mg Oral Q breakfast  . diltiazem  120 mg Oral Daily  . famotidine  20 mg Oral Daily  . feeding supplement (ENSURE ENLIVE)  237 mL Oral TID BM  . ferrous sulfate  325 mg Oral Q breakfast  . insulin aspart  0-20 Units Subcutaneous TID WC  . insulin aspart  0-5 Units Subcutaneous QHS  . insulin glargine  15 Units Subcutaneous BID  . isosorbide mononitrate  60 mg Oral Daily  . mouth rinse  15 mL Mouth Rinse BID  . metoprolol tartrate  50 mg Oral BID  . multivitamin with minerals  1 tablet Oral Daily  . polyethylene glycol  17 g Oral BID  . QUEtiapine  50 mg Oral BID  . Rivaroxaban  15 mg Oral Q supper  . senna  2 tablet Oral QHS  . silver sulfADIAZINE  1 application Topical BID  . sodium chloride flush  3 mL Intravenous Q12H  . sodium chloride flush  3 mL Intravenous Q12H  . ascorbic acid  500 mg Oral Daily  . zinc sulfate  220 mg Oral Daily   Continuous: . sodium chloride    . remdesivir 100 mg in NS 250 mL     [id number] chloride, acetaminophen, bisacodyl, chlorpheniramine-HYDROcodone, guaiFENesin-dextromethorphan, haloperidol lactate, haloperidol lactate, HYDROcodone-acetaminophen, nitroGLYCERIN,  ondansetron **OR** ondansetron (ZOFRAN) IV, sodium chloride flush   Objective:  Vital Signs  Vitals:   02/12/19 0400 02/12/19 0500 02/12/19 0600 02/12/19 0745  BP:    (!) 177/94  Pulse: (!) 47 (!) 50 (!) 44 62  Resp:      Temp:    98.3 F (36.8 C)  TempSrc:    Oral  SpO2: 95% 93% 96% 98%  Weight:      Height:  Intake/Output Summary (Last 24 hours) at 02/12/2019 1037 Last data filed at 02/12/2019 0600 Gross per 24 hour  Intake 150 ml  Output 450 ml  Net -300 ml   Filed Weights   02/08/19 1548  Weight: 78.3 kg    General appearance: Sleepy this morning.  Delirious. Resp: Normal effort.  Coarse breath sounds bilaterally without any wheezing or rhonchi.  Few crackles at the bases.   Cardio: S1-S2 is normal regular.  No S3-S4.  No rubs murmurs or bruit GI: Abdomen is soft.  Nontender nondistended.  Bowel sounds are present normal.  No masses organomegaly Extremities: Bilateral BKA.  Dressing noted over the right stump. Neurologic: Delirious.  No obvious focal neurological deficits.    Lab Results:  Data Reviewed: I have personally reviewed following labs and imaging studies  CBC: Recent Labs  Lab 02/08/19 0556 02/09/19 0258 02/10/19 0150 02/11/19 0700 02/12/19 0350  WBC 17.5* 7.1 9.0 11.3* 14.0*  NEUTROABS 15.0* 6.7 8.4* 10.1* 12.1*  HGB 10.0* 9.0* 8.5* 9.9* 9.5*  HCT 31.8* 29.5* 27.1* 31.4* 30.6*  MCV 88.6 89.7 88.9 88.2 88.7  PLT 410* 355 375 398 [id number]    Basic Metabolic Panel: Recent Labs  Lab 02/08/19 0556 02/09/19 0258 02/09/19 1356 02/10/19 0150 02/11/19 0700 02/12/19 0350  NA 135 138  --  136 137 138  K 4.2 5.3* 4.2 4.6 4.2 4.6  CL 105 104  --  104 102 103  CO2 21* 26  --  24 27 26   GLUCOSE 265* 313*  --  299* 253* 227*  BUN 36* 38*  --  43* 41* 43*  CREATININE 1.38* 1.33*  --  1.08 1.13 1.03  CALCIUM 8.1* 8.4*  --  8.4* 8.5* 8.4*  MG  --  1.8  --  1.8 1.8 1.9    GFR: Estimated Creatinine Clearance: 52.6 mL/min (by C-G formula  based on SCr of 1.03 mg/dL).  Liver Function Tests: Recent Labs  Lab 02/08/19 1707 02/09/19 0258 02/10/19 0150 02/11/19 0700 02/12/19 0350  AST 27 28 30  42* 36  ALT 19 22 23  38 38  ALKPHOS 131* 123 123 120 98  BILITOT 0.5 0.7 0.7 0.5 0.5  PROT 6.6 6.5 6.3* 6.4* 5.9*  ALBUMIN 2.6* 2.5* 2.6* 2.7* 2.4*     Coagulation Profile: Recent Labs  Lab 02/08/19 0557  INR 3.0*     CBG: Recent Labs  Lab 02/11/19 0737 02/11/19 1207 02/11/19 1641 02/11/19 2144 02/12/19 0750  GLUCAP 235* 400* 449* 145* 217*     Anemia Panel: Recent Labs    02/10/19 0150  FERRITIN 60    Recent Results (from the past 240 hour(s))  Blood Culture (routine x 2)     Status: None (Preliminary result)   Collection Time: 02/08/19  5:57 AM   Specimen: BLOOD  Result Value Ref Range Status   Specimen Description BLOOD BLOOD LEFT HAND  Final   Special Requests   Final    BOTTLES DRAWN AEROBIC AND ANAEROBIC Blood Culture adequate volume   Culture   Final    NO GROWTH 4 DAYS Performed at [facility], [address] [place], [place] [address]    Report Status PENDING  Incomplete  Blood Culture (routine x 2)     Status: None (Preliminary result)   Collection Time: 02/08/19  5:57 AM   Specimen: BLOOD  Result Value Ref Range Status   Specimen Description BLOOD RIGHT WRIST  Final   Special Requests   Final  BOTTLES DRAWN AEROBIC AND ANAEROBIC Blood Culture results may not be optimal due to an excessive volume of blood received in culture bottles   Culture   Final    NO GROWTH 4 DAYS Performed at [facility], [address], [place], [place] [address]    Report Status PENDING  Incomplete  SARS Coronavirus 2 [facility] order, Performed in [facility] hospital lab) Nasopharyngeal Nasopharyngeal Swab     Status: Abnormal   Collection Time: 02/08/19  6:01 AM   Specimen: Nasopharyngeal Swab  Result Value Ref Range Status   SARS Coronavirus 2 POSITIVE (A) NEGATIVE Final     Comment: RESULT CALLED TO, READ BACK BY AND VERIFIED WITH:  LAURA CATES AT 0735 02/08/2019 SDR (NOTE) If result is NEGATIVE SARS-CoV-2 target nucleic acids are NOT DETECTED. The SARS-CoV-2 RNA is generally detectable in upper and lower  respiratory specimens during the acute phase of infection. The lowest  concentration of SARS-CoV-2 viral copies this assay can detect is 250  copies / mL. A negative result does not preclude SARS-CoV-2 infection  and should not be used as the sole basis for treatment or other  patient management decisions.  A negative result may occur with  improper specimen collection / handling, submission of specimen other  than nasopharyngeal swab, presence of viral mutation(s) within the  areas targeted by this assay, and inadequate number of viral copies  (<250 copies / mL). A negative result must be combined with clinical  observations, patient history, and epidemiological information. If result is POSITIVE SARS-CoV-2 target nucleic acids are DETECTED. Th e SARS-CoV-2 RNA is generally detectable in upper and lower  respiratory specimens during the acute phase of infection.  Positive  results are indicative of active infection with SARS-CoV-2.  Clinical  correlation with patient history and other diagnostic information is  necessary to determine patient infection status.  Positive results do  not rule out bacterial infection or co-infection with other viruses. If result is PRESUMPTIVE POSTIVE SARS-CoV-2 nucleic acids MAY BE PRESENT.   A presumptive positive result was obtained on the submitted specimen  and confirmed on repeat testing.  While 2019 novel coronavirus  (SARS-CoV-2) nucleic acids may be present in the submitted sample  additional confirmatory testing may be necessary for epidemiological  and / or clinical management purposes  to differentiate between  SARS-CoV-2 and other Sarbecovirus currently known to infect humans.  If clinically indicated  additional testing with an alternate test  methodology [phone]) is  advised. The SARS-CoV-2 RNA is generally  detectable in upper and lower respiratory specimens during the acute  phase of infection. The expected result is Negative. Fact Sheet for Patients:  StrictlyIdeas.no Fact Sheet for Healthcare Providers: BankingDealers.co.za This test is not yet approved or cleared by the [place] FDA and has been authorized for detection and/or diagnosis of SARS-CoV-2 by FDA under an Emergency Use Authorization (EUA).  This EUA will remain in effect (meaning this test can be used) for the duration of the COVID-19 declaration under Section 564(b)(1) of the Act, 21 U.S.C. section 360bbb-3(b)(1), unless the authorization is terminated or revoked sooner. Performed at [facility], [address] [place], [place] [address]   MRSA PCR Screening     Status: None   Collection Time: 02/08/19  6:45 PM   Specimen: Nasal Mucosa; Nasopharyngeal  Result Value Ref Range Status   MRSA by PCR NEGATIVE NEGATIVE Final    Comment:        The GeneXpert MRSA Assay (FDA  approved for NASAL specimens only), is one component of a comprehensive MRSA colonization surveillance program. It is not intended to diagnose MRSA infection nor to guide or monitor treatment for MRSA infections. Performed at [facility], [place] [address], [place], [place] [address]       Radiology Studies: No results found.     LOS: 4 days   Cuyler Vandyken [employer] on [email or web address]  02/12/2019, 10:37 AM

## 2019-02-12 NOTE — Progress Notes (Signed)
Called and updated patient's son on patient's progress during the night.  The son would like the Education officer, museum to reach out to him.  He feels like they might have to take patient home and if he does not improve, then try to get patient into NH.  The patient's wife is 82 years old and early Alzheimer according to the son.  We talked about the concern on how difficult it would be to get patient into NH directly from the home environment.  He stated he applied for Medicaid for the patient while he was in [place] but has not heard anything from them.  He is concerned about the financial aspects also.  He is concerned that they will not be able to care for the patient at home based on how he has been while in the hospital.  He wants to do what is best for his mother and for his father.  I advised that I would ask for the Case Manager or Social Worker to reach out to him.  Earleen Reaper RN

## 2019-02-12 NOTE — Progress Notes (Signed)
Patient is observed resting in bed, will arouse to voice. MD Vernell Barrier aware of pt's HR 40-50's while sleeping. Other VS WNL. NNO, continue to monitor. Pt in no acute distress, HOB 30 degree, bed alarm engaged. Floor pads present in room. Will continue to monitor remainder of this writer's shift

## 2019-02-12 NOTE — Progress Notes (Signed)
Pt became very aggressive and combative w staff when attempting to clean pt, do linen change. Pt hitting at staff, verbal redirection unsuccessful. This Probation officer had additional staff to help w linen change, peri-care and change of condom  catheter. Pt received 5mg  IV Haldol per MD order. Pt turned to Right side. Covered w warm blanket and bed alarm engaged. VS are WNL. Will continue to monitor remainder of this shift

## 2019-02-12 NOTE — Progress Notes (Signed)
   02/11/19 2210  Provider Notification  Provider Name/Title Dr. Bridgett Larsson  Date Provider Notified 02/11/19  Time Provider Notified 2210  Notification Type Page  Notification Reason Change in status  Response See new orders  Date of Provider Response 02/11/19   Upon coming on to shift, patient was yelling at staff and trying to get out of bed.  He could not be redirected and attempted to hit several staff.  5 mg IV Haldol given at 2017 with no discernable effect.  He continued to remove condom cath, take off his gown, and his blankets.  He did complain of his right stump hurting and agreed to take two hydrocodone at 2309.  His HR while agitated was in the low 60.  I talked with Dr. Bridgett Larsson and advised of the above.  Metoprolol 50 mg was held d/t HR.  He gave me an order for Ativan which had to be given at 0013 for increasing agitation and patient attempting to get OOB.  Patient has slept from about 0200 am.  His oxygen levels are 91% and above on room air.  Will continue to monitor patient.  Earleen Reaper RN

## 2019-02-12 NOTE — TOC Initial Note (Signed)
Transition of Care [facility]) - Initial/Assessment Note    Patient Details  Name: [name] MRN: [id number] Date of Birth: [birth year]  Transition of Care (TOC) CM/SW Contact:    Joaquin Courts, RN Phone Number: 02/12/2019, 1:17 PM  Clinical Narrative:    CM spoke with patient's son [name] who expressed that the family does not feel they can provide the level of care needed by the patient.  Patient was recently at [facility] for rehab and returned home where his care exceeded the family's abilities.  Son is interested in exploring SNF placement at discharge for additional rehab and potentially transitioning to a long term care placement if needed after rehab completes.  CM spoke with son regarding the barriers of COVID+, limiting the facilities that may extend bed offers. CM faxed out FL2 with permission to area facilities that accept Covid + patients, awaiting response.                  Expected Discharge Plan: Skilled Nursing Facility Barriers to Discharge: Continued Medical Work up   Patient Goals and CMS Choice Patient states their goals for this hospitalization and ongoing recovery are:: to go to rehab      Expected Discharge Plan and Services Expected Discharge Plan: [place]   Discharge Planning Services: CM Consult   Living arrangements for the past 2 months: Single Family Home, [place]                 DME Arranged: N/A DME Agency: NA       HH Arranged: NA [place] Agency: NA        Prior Living Arrangements/Services Living arrangements for the past 2 months: [place], [place] Lives with:: Spouse, Facility Resident Patient language and need for interpreter reviewed:: Yes Do you feel safe going back to the place where you live?: No   family does not feel they can provide the level of care patient needs  Need for Family Participation in Patient Care: Yes (Comment) Care giver support system  in place?: Yes (comment)   Criminal Activity/Legal Involvement Pertinent to Current Situation/Hospitalization: No - Comment as needed  Activities of Daily Living Home Assistive Devices/Equipment: Wheelchair ADL Screening (condition at time of admission) Patient's cognitive ability adequate to safely complete daily activities?: Yes Is the patient deaf or have difficulty hearing?: Yes Does the patient have difficulty seeing, even when wearing glasses/contacts?: No Does the patient have difficulty concentrating, remembering, or making decisions?: Yes Patient able to express need for assistance with ADLs?: Yes Does the patient have difficulty dressing or bathing?: Yes Independently performs ADLs?: No Communication: Independent Dressing (OT): Needs assistance Is this a change from baseline?: Pre-admission baseline Grooming: Needs assistance Is this a change from baseline?: Pre-admission baseline Feeding: Independent Bathing: Needs assistance Is this a change from baseline?: Pre-admission baseline Toileting: Needs assistance Is this a change from baseline?: Pre-admission baseline In/Out Bed: Needs assistance Is this a change from baseline?: Pre-admission baseline Walks in Home: Independent with device (comment) Does the patient have difficulty walking or climbing stairs?: Yes Weakness of Legs: Both Weakness of Arms/Hands: None  Permission Sought/Granted                  Emotional Assessment       Orientation: : Oriented to Self   Psych Involvement: No (comment)  Admission diagnosis:  PNEUMONIA COVID-19 VIRUS INFECTION Patient Active Problem List   Diagnosis Date Noted  . Acute respiratory  failure with hypoxia ([place]) 02/08/2019  . Pneumonia due to COVID-19 virus 02/08/2019  . Below-knee amputation of right lower extremity ([place]) 01/15/2019  . Polyp of descending colon   . Iron deficiency anemia due to chronic blood loss   . Sacral decubitus ulcer, stage II ([place])  12/26/2018  . Acute respiratory failure ([place]) 12/20/2018  . Femur fracture ([place]) 10/13/2018  . Chest pain 06/03/2018  . NSTEMI (non-ST elevated myocardial infarction) ([place]) 01/16/2018  . Acute CHF (congestive heart failure) ([place]) 11/23/2017  . Atherosclerosis of native arteries of the extremities with ulceration ([place]) 11/18/2017  . Atrial fibrillation with rapid ventricular response ([place]) 10/15/2017  . BKA stump complication ([place]) 0000000  . 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 S/P BKA (below knee amputation) unilateral, left ([place]) 02/01/2017  . Chronic diastolic heart failure ([place]) 01/21/2017  . Pressure injury of skin 01/18/2017  . 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 Diabetic foot ulcer ([place]) 12/29/2016  . Ataxia 10/21/2016  . History of femoral angiogram 09/06/2016  . Left leg pain 09/06/2016  . Leukocytosis 09/06/2016  . Generalized weakness 09/06/2016  . Atrial fibrillation, chronic 08/30/2016  . Pneumonia 05/19/2016  . Hyperlipidemia 04/20/2016  . Back pain 04/19/2016  . Type 2 diabetes mellitus treated with insulin ([place]) 04/19/2016  . Essential hypertension 04/19/2016  . PAD (peripheral artery disease) ([place]) 04/19/2016  . Erectile dysfunction following radical prostatectomy 06/25/2015  . History of prostate cancer 12/23/2014  . Incontinence 12/23/2014   PCP:  Corey Skains, MD Pharmacy:   [facility] [address] [place] - [place] [address] Phone: [phone] Fax: [phone]  [facility] [place], [place] [address] [place] [address] Phone: [phone] Fax: [place], [place] - [address] [facility]. [address] [place] [address] Phone: [phone] Fax: [phone]     Social Determinants of Health (SDOH)  Interventions    Readmission Risk Interventions Readmission Risk Prevention Plan 12/25/2018 12/23/2018  Transportation Screening Complete Complete  PCP or Specialist Appt within 3-5 Days Complete -  HRI or [place] Complete Complete  Social Work Consult for [place] Planning/Counseling Complete -  Palliative Care Screening Not Applicable -  Medication Review Press photographer) Complete Complete  Some recent data might be hidden

## 2019-02-12 NOTE — NC FL2 (Signed)
[place] LEVEL OF CARE SCREENING TOOL     IDENTIFICATION  Patient Name: [name] Birthdate: Oct 13, 1934 Sex: male Admission Date (Current Location): 02/08/2019  [facility] and [place] Number:  Herbalist and Address:  [facility],  [place], [place]      Provider Number: [id number]  Attending Physician Name and Address:  Bonnielee Haff, MD  Relative Name and Phone Number:  Payton [place]field [phone]    Current Level of Care: Hospital Recommended Level of Care: [place] Prior Approval Number:    Date Approved/Denied:   PASRR Number: [id number]  Discharge Plan: SNF    Current Diagnoses: Patient Active Problem List   Diagnosis Date Noted  . Acute respiratory failure with hypoxia ([place]) 02/08/2019  . Pneumonia due to COVID-19 virus 02/08/2019  . Below-knee amputation of right lower extremity ([place]) 01/15/2019  . Polyp of descending colon   . Iron deficiency anemia due to chronic blood loss   . Sacral decubitus ulcer, stage II ([place]) 12/26/2018  . Acute respiratory failure ([place]) 12/20/2018  . Femur fracture ([place]) 10/13/2018  . Chest pain 06/03/2018  . NSTEMI (non-ST elevated myocardial infarction) ([place]) 01/16/2018  . Acute CHF (congestive heart failure) ([place]) 11/23/2017  . Atherosclerosis of native arteries of the extremities with ulceration ([place]) 11/18/2017  . Atrial fibrillation with rapid ventricular response ([place]) 10/15/2017  . BKA stump complication ([place]) 0000000  . 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 S/P BKA (below knee amputation) unilateral, left ([place]) 02/01/2017  . Chronic diastolic heart failure ([place]) 01/21/2017  . Pressure injury of skin 01/18/2017  . 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 Diabetic foot ulcer ([place]) 12/29/2016  . Ataxia 10/21/2016  . History of femoral angiogram 09/06/2016  . Left leg  pain 09/06/2016  . Leukocytosis 09/06/2016  . Generalized weakness 09/06/2016  . Atrial fibrillation, chronic 08/30/2016  . Pneumonia 05/19/2016  . Hyperlipidemia 04/20/2016  . Back pain 04/19/2016  . Type 2 diabetes mellitus treated with insulin ([place]) 04/19/2016  . Essential hypertension 04/19/2016  . PAD (peripheral artery disease) ([place]) 04/19/2016  . Erectile dysfunction following radical prostatectomy 06/25/2015  . History of prostate cancer 12/23/2014  . Incontinence 12/23/2014    Orientation RESPIRATION BLADDER Height & Weight     Self  Normal Incontinent Weight: 78.3 kg Height:  5\' 7"  (170.2 cm)  BEHAVIORAL SYMPTOMS/MOOD NEUROLOGICAL BOWEL NUTRITION STATUS      Incontinent Diet  AMBULATORY STATUS COMMUNICATION OF NEEDS Skin   Total Care Verbally PU Stage and Appropriate Care, Surgical wounds(stage 2 to buttocks, incision to R Knee from BKA)   PU Stage 2 Dressing: Daily                   Personal Care Assistance Level of Assistance  Bathing, Dressing, Total care Bathing Assistance: Maximum assistance   Dressing Assistance: Maximum assistance Total Care Assistance: Maximum assistance   Functional Limitations Info             SPECIAL CARE FACTORS FREQUENCY  PT (By licensed PT), OT (By licensed OT)     PT Frequency: 5x a week OT Frequency: 5x a week            Contractures Contractures Info: Not present    Additional Factors Info  Code Status, Allergies Code Status Info: full Allergies Info: contrast media, iohexol, metrizamide, latex  Current Medications (02/12/2019):  This is the current hospital active medication list Current Facility-Administered Medications  Medication Dose Route Frequency Provider Last Rate Last Dose  . 0.9 %  sodium chloride infusion  250 mL Intravenous PRN Bonnielee Haff, MD      . acetaminophen (TYLENOL) tablet 650 mg  650 mg Oral Q6H PRN Bonnielee Haff, MD      . amiodarone (PACERONE) tablet 200 mg  200 mg  Oral Daily Bonnielee Haff, MD   200 mg at 02/12/19 0909  . aspirin EC tablet 81 mg  81 mg Oral Daily Bonnielee Haff, MD   81 mg at 02/12/19 0908  . atorvastatin (LIPITOR) tablet 80 mg  80 mg Oral QPM Bonnielee Haff, MD   80 mg at 02/11/19 1708  . bisacodyl (DULCOLAX) EC tablet 5 mg  5 mg Oral Daily PRN Bonnielee Haff, MD   5 mg at 02/08/19 1813  . chlorpheniramine-HYDROcodone (TUSSIONEX) 10-8 MG/5ML suspension 5 mL  5 mL Oral Q12H PRN Bonnielee Haff, MD      . Derrill Memo ON 02/13/2019] dexamethasone (DECADRON) tablet 2 mg  2 mg Oral Q breakfast Bonnielee Haff, MD      . diltiazem (CARDIZEM CD) 24 hr capsule 120 mg  120 mg Oral Daily Bonnielee Haff, MD   120 mg at 02/12/19 0909  . famotidine (PEPCID) tablet 20 mg  20 mg Oral Daily Bonnielee Haff, MD   20 mg at 02/12/19 0909  . feeding supplement (ENSURE ENLIVE) (ENSURE ENLIVE) liquid 237 mL  237 mL Oral TID BM Bonnielee Haff, MD   237 mL at 02/11/19 1533  . ferrous sulfate tablet 325 mg  325 mg Oral Q breakfast Bonnielee Haff, MD   325 mg at 02/12/19 0909  . guaiFENesin-dextromethorphan (ROBITUSSIN DM) 100-10 MG/5ML syrup 10 mL  10 mL Oral Q4H PRN Bonnielee Haff, MD      . haloperidol lactate (HALDOL) injection 2-5 mg  2-5 mg Intravenous Q6H PRN Etta Quill, DO   5 mg at 02/12/19 0948  . haloperidol lactate (HALDOL) injection 2-5 mg  2-5 mg Intramuscular Q6H PRN Etta Quill, DO      . hydrALAZINE (APRESOLINE) injection 10 mg  10 mg Intravenous Q6H PRN Bonnielee Haff, MD      . HYDROcodone-acetaminophen (NORCO/VICODIN) 5-325 MG per tablet 1-2 tablet  1-2 tablet Oral Q6H PRN Bonnielee Haff, MD   2 tablet at 02/12/19 [id number]  . insulin aspart (novoLOG) injection 0-20 Units  0-20 Units Subcutaneous TID WC Bonnielee Haff, MD   4 Units at 02/12/19 1210  . insulin aspart (novoLOG) injection 0-5 Units  0-5 Units Subcutaneous QHS Bonnielee Haff, MD   4 Units at 02/10/19 2209  . insulin glargine (LANTUS) injection 18 Units  18 Units  Subcutaneous BID Bonnielee Haff, MD      . isosorbide mononitrate (IMDUR) 24 hr tablet 60 mg  60 mg Oral Daily Bonnielee Haff, MD   60 mg at 02/12/19 0909  . MEDLINE mouth rinse  15 mL Mouth Rinse BID Bonnielee Haff, MD   15 mL at 02/12/19 0911  . metoprolol tartrate (LOPRESSOR) tablet 25 mg  25 mg Oral TID Bonnielee Haff, MD      . multivitamin with minerals tablet 1 tablet  1 tablet Oral Daily Bonnielee Haff, MD   1 tablet at 02/12/19 0909  . nitroGLYCERIN (NITROSTAT) SL tablet 0.4 mg  0.4 mg Sublingual Q5 min PRN Bonnielee Haff, MD   0.4 mg at 02/09/19 1232  . ondansetron (ZOFRAN)  tablet 4 mg  4 mg Oral Q6H PRN Bonnielee Haff, MD       Or  . ondansetron [facility]) injection 4 mg  4 mg Intravenous Q6H PRN Bonnielee Haff, MD      . polyethylene glycol (MIRALAX / GLYCOLAX) packet 17 g  17 g Oral BID Bonnielee Haff, MD   17 g at 02/11/19 0906  . QUEtiapine (SEROQUEL) tablet 50 mg  50 mg Oral BID Bonnielee Haff, MD      . Rivaroxaban Alveda Reasons) tablet 15 mg  15 mg Oral Q supper Bonnielee Haff, MD   15 mg at 02/11/19 1708  . senna (SENOKOT) tablet 17.2 mg  2 tablet Oral QHS Bonnielee Haff, MD   17.2 mg at 02/08/19 2101  . silver sulfADIAZINE (SILVADENE) 1 % cream 1 application  1 application Topical BID Bonnielee Haff, MD   1 application at [id number]  . sodium chloride flush (NS) 0.9 % injection 3 mL  3 mL Intravenous Q12H Bonnielee Haff, MD   3 mL at 02/12/19 0911  . sodium chloride flush (NS) 0.9 % injection 3 mL  3 mL Intravenous Q12H Bonnielee Haff, MD   3 mL at 02/11/19 2021  . sodium chloride flush (NS) 0.9 % injection 3 mL  3 mL Intravenous PRN Bonnielee Haff, MD      . vitamin C (ASCORBIC ACID) tablet 500 mg  500 mg Oral Daily Bonnielee Haff, MD   500 mg at 02/12/19 0912  . zinc sulfate capsule 220 mg  220 mg Oral Daily Bonnielee Haff, MD   220 mg at 02/12/19 [id number]     Discharge Medications: Please see discharge summary for a list of discharge medications.  Relevant  Imaging Results:  Relevant Lab Results:   Additional Information [id number]  Joaquin Courts, RN
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
## 2019-02-12 NOTE — Plan of Care (Signed)
Continue w POC. This Probation officer spoke w patient's son [name] via telephone for update. After questions answered by this writer regarding update, son has questions related to Case Management. This Probation officer notified Case Manager via text to please call son when they are able.

## 2019-02-13 ENCOUNTER — Ambulatory Visit (INDEPENDENT_AMBULATORY_CARE_PROVIDER_SITE_OTHER): Payer: Medicare Other | Admitting: Nurse Practitioner

## 2019-02-13 ENCOUNTER — Encounter (INDEPENDENT_AMBULATORY_CARE_PROVIDER_SITE_OTHER): Payer: Medicare Other

## 2019-02-13 LAB — CBC WITH DIFFERENTIAL/PLATELET
Abs Immature Granulocytes: 0.08 10*3/uL — ABNORMAL HIGH (ref 0.00–0.07)
Basophils Absolute: 0 10*3/uL (ref 0.0–0.1)
Basophils Relative: 0 %
Eosinophils Absolute: 0.2 10*3/uL (ref 0.0–0.5)
Eosinophils Relative: 1 %
HCT: 32.1 % — ABNORMAL LOW (ref 39.0–52.0)
Hemoglobin: 10.1 g/dL — ABNORMAL LOW (ref 13.0–17.0)
Immature Granulocytes: 1 %
Lymphocytes Relative: 13 %
Lymphs Abs: 1.6 10*3/uL (ref 0.7–4.0)
MCH: 27.7 pg (ref 26.0–34.0)
MCHC: 31.5 g/dL (ref 30.0–36.0)
MCV: 88.2 fL (ref 80.0–100.0)
Monocytes Absolute: 1 10*3/uL (ref 0.1–1.0)
Monocytes Relative: 8 %
Neutro Abs: 9.3 10*3/uL — ABNORMAL HIGH (ref 1.7–7.7)
Neutrophils Relative %: 77 %
Platelets: 372 10*3/uL (ref 150–400)
RBC: 3.64 MIL/uL — ABNORMAL LOW (ref 4.22–5.81)
RDW: 15.9 % — ABNORMAL HIGH (ref 11.5–15.5)
WBC: 12.1 10*3/uL — ABNORMAL HIGH (ref 4.0–10.5)
nRBC: 0 % (ref 0.0–0.2)

## 2019-02-13 LAB — CULTURE, BLOOD (ROUTINE X 2)
Culture: NO GROWTH
Culture: NO GROWTH
Special Requests: ADEQUATE

## 2019-02-13 LAB — COMPREHENSIVE METABOLIC PANEL
ALT: 42 U/L (ref 0–44)
AST: 45 U/L — ABNORMAL HIGH (ref 15–41)
Albumin: 2.4 g/dL — ABNORMAL LOW (ref 3.5–5.0)
Alkaline Phosphatase: 85 U/L (ref 38–126)
Anion gap: 10 (ref 5–15)
BUN: 35 mg/dL — ABNORMAL HIGH (ref 8–23)
CO2: 26 mmol/L (ref 22–32)
Calcium: 8.3 mg/dL — ABNORMAL LOW (ref 8.9–10.3)
Chloride: 103 mmol/L (ref 98–111)
Creatinine, Ser: 0.97 mg/dL (ref 0.61–1.24)
GFR calc Af Amer: >60 mL/min (ref >60)
GFR calc non Af Amer: >60 mL/min (ref >60)
Glucose, Bld: 69 mg/dL — ABNORMAL LOW (ref 70–99)
Potassium: 3.8 mmol/L (ref 3.5–5.1)
Sodium: 139 mmol/L (ref 135–145)
Total Bilirubin: 0.7 mg/dL (ref 0.3–1.2)
Total Protein: 6.1 g/dL — ABNORMAL LOW (ref 6.5–8.1)

## 2019-02-13 LAB — MAGNESIUM: Magnesium: 1.9 mg/dL (ref 1.7–2.4)

## 2019-02-13 LAB — GLUCOSE, CAPILLARY
Glucose-Capillary: 66 mg/dL — ABNORMAL LOW (ref 70–99)
Glucose-Capillary: 76 mg/dL (ref 70–99)
Glucose-Capillary: 97 mg/dL (ref 70–99)
Glucose-Capillary: 130 mg/dL — ABNORMAL HIGH (ref 70–99)

## 2019-02-13 MED ORDER — DEXTROSE 50 % IV SOLN: 50.0000 mL | INTRAVENOUS | Status: DC | PRN

## 2019-02-13 MED ORDER — INSULIN GLARGINE 100 UNIT/ML ~~LOC~~ SOLN
18.0000 [IU] | Freq: Every day | SUBCUTANEOUS | Status: DC
  Administered 2019-02-14 – 2019-02-18 (×5): 18 [IU] via SUBCUTANEOUS
  Filled 2019-02-13 (×6): qty 0.18

## 2019-02-13 MED ORDER — DEXTROSE 50 % IV SOLN
25.0000 mL | Freq: Once | INTRAVENOUS | Status: DC
  Filled 2019-02-13: qty 50

## 2019-02-13 NOTE — Progress Notes (Addendum)
Pt agitated. Administered prn regimen for agitation. Complete bath/linen changed, dressing changes on wounds administered and administered ace wrap on rt bka. Rt BKA odorous, may need a wound consult. Communicated with the son apprx 2130.

## 2019-02-13 NOTE — Progress Notes (Signed)
Unable to vs @ 0400 r/t agitation; will re approach before shift change.

## 2019-02-13 NOTE — Progress Notes (Signed)
PROGRESS NOTE  [name] [id number] DOB: 1934-08-12 DOA: 02/08/2019  PCP: Jesse Skains, MD  Brief History/Interval Summary: 84 y.o. male with a past medical history of chronic systolic CHF with a EF of 45 to 50%, atrial fibrillation on anticoagulation, insulin-dependent diabetes mellitus, essential hypertension, peripheral vascular disease, recent right below-knee amputation, previous left below-knee amputation, history of prostate cancer, history of sleep apnea on CPAP who recently came home from a skilled nursing facility a few days prior to this admission.  He was at the rehab facility after he underwent right below-knee amputation last month.  He was doing well and then the day prior to admission patient started developing shortness of breath.  Symptoms worsened during the nighttime so he had his wife called EMS.  He was taken to the emergency department at [facility]. In the emergency department patient was found to have right lung pneumonia.  He was noted to be hypoxic.  Initially placed on BiPAP.  Patient was hospitalized.  Subsequently the COVID-19 test came back positive.  He was subsequently sent over to the [facility] for further management.   Reason for Visit: Pneumonia secondary to COVID-19  Consultants: None  Procedures: None  Antibiotics: Anti-infectives (From admission, onward)   Start     Dose/Rate Route Frequency Ordered Stop   02/12/19 1000  remdesivir 100 mg in sodium chloride 0.9 % 250 mL IVPB     100 mg 500 mL/hr over 30 Minutes Intravenous Every 24 hours 02/11/19 2004 02/12/19 1241   02/09/19 2000  remdesivir 100 mg in sodium chloride 0.9 % 250 mL IVPB  Status:  Discontinued     100 mg 500 mL/hr over 30 Minutes Intravenous Every 24 hours 02/08/19 1909 02/09/19 1303   02/09/19 1600  remdesivir 100 mg in sodium chloride 0.9 % 250 mL IVPB  Status:  Discontinued     100 mg 500 mL/hr over 30 Minutes Intravenous Every 24 hours 02/09/19 1303  02/11/19 2004   02/09/19 1000  cefTRIAXone (ROCEPHIN) 1 g in sodium chloride 0.9 % 100 mL IVPB     1 g 200 mL/hr over 30 Minutes Intravenous Every 24 hours 02/09/19 0949 02/12/19 0955   02/09/19 1000  azithromycin (ZITHROMAX) tablet 500 mg     500 mg Oral Daily 02/09/19 0949 02/12/19 0908   02/08/19 2200  sulfamethoxazole-trimethoprim (BACTRIM) 400-80 MG per tablet 1 tablet  Status:  Discontinued     1 tablet Oral 2 times daily 02/08/19 1637 02/08/19 1701   02/08/19 2200  sulfamethoxazole-trimethoprim (BACTRIM) 400-80 MG per tablet 1 tablet  Status:  Discontinued     1 tablet Oral 2 times daily 02/08/19 1701 02/09/19 0949   02/08/19 2000  remdesivir 200 mg in sodium chloride 0.9 % 250 mL IVPB     200 mg 500 mL/hr over 30 Minutes Intravenous Once 02/08/19 1909 02/09/19 0500       Subjective/Interval History: Patient continues to have episodes of agitation overnight.  This morning he is very irritable and agitated.  He refuses examination.  Started swinging his arms at me.      Assessment/Plan:  Acute Hypoxic Resp. Failure due to Acute Covid 19 Viral Illness/pneumonia secondary to COVID-19/sepsis present on admission  COVID-19 Labs  No results for input(s): DDIMER, FERRITIN, LDH, CRP in the last 72 hours.  Lab Results  Component Value Date   [place] (A) 02/08/2019   [place] NEGATIVE 01/05/2019   [place] NEGATIVE 12/20/2018   [place] NEGATIVE 12/15/2018  Fever: Remains afebrile. Oxygen requirements: Has required oxygen 2 L overnight.  This morning noted to be on room air.  Saturating in the early 90s.   Antibacterials: Completed 5-day course of antibiotics with ceftriaxone and azithromycin. Remdesivir: Completed 5-day course on 9/21 Steroids: Dexamethasone being tapered down Diuretics: Not on a scheduled basis Actemra: Not indicated yet Vitamin C and Zinc: Continue  DVT Prophylaxis: On rivaroxaban at home which is being continued  Patient's  respiratory status has improved.  He still requiring oxygen on and off.  However he is not noted to be tachypneic.  He has completed course of Remdesivir and antibacterials.  His inflammatory markers had improved.  CRP was down to 1.5.  Steroids being tapered down.  Continue with incentive spirometry.  However due to his agitation patient likely not very cooperative.  Acute metabolic encephalopathy/hospital-acquired delirium Patient remains delirious.  This is likely multifactorial including hospital stay, and acute illness, steroids in the setting of dementia.  Continue with Seroquel twice a day.  Haldol as needed.  QTC was 470 on 9/20.  Stable angina in the setting of known coronary artery disease Patient had anginal chest pain on 9/18 relieved with nitroglycerin.  Minimal elevation in troponin noted.  Discussed with cardiology as patient did have some EKG changes.  Appreciate their input.  Please review the note from 9/18.  Patient's beta-blocker dose was increased.  He was started on Imdur.  With these his symptoms have resolved.  No further interventions at this time.  Continue aspirin.  Patient is anticoagulated with rivaroxaban.  Patient followed by cardiology in [facility].  Patient had a cardiac catheterization earlier this year.  Was found to have significant disease.  Specifically RCA lesion of 75% distal LM to ostial LAD of 100% and proximal circumflex of 50%. EF was found to be 35% at that time.  Intervention was deferred in favor of aggressive medical therapy.  Dose of beta-blocker decreased due to bradycardia.  Recent right BKA with stump wound Followed by vascular surgery in [place], Dr. Lucky Cowboy.  Still has staples in the wound.  He was last seen in their office on September 8.  Had an appointment on 9/18 but he was in the hospital.  This will be rescheduled for next week.  He was placed on Bactrim at the office at the last visit.  According to the son patient has finished a  course.  So Bactrim was discontinued.  Wound care nurses seen the patient.  I tried to reexamine his wound today but patient was not very cooperative.  He started swinging his arms at me.  I was able to take the previous dressing off partially.  I do not see any significant changes from my examination from a few days ago however examination was limited.  No active drainage noted.  No foul smell was present.  Continue with dressing changes and silver sulfadiazine cream.  Will need to reexamine wound when patient is more cooperative.  Mild acute renal failure/CKD #3 Baseline creatinine around 1.1.  It was 1.3 at admission.  Improved.  Potassium is normal.  Continue to monitor.  Continue to monitor urine output.  Chronic systolic CHF Echocardiogram from earlier this year showed a EF of 45 to 50%.  Ejection fraction with cardiac catheterization noted to be around 35%.  BNP was elevated in the ED.  Patient was given Lasix in the ED. remains euvolemic.  Continue to monitor clinically.    Essential hypertension Elevated blood pressure most  likely due to agitation.  Continue with current antihypertensive regimen for now.  Imdur dose was increased.  History of paroxysmal atrial fibrillation Continue with diltiazem and metoprolol.  Rate is well controlled.  He is on rivaroxaban for stroke prevention.  Diabetes mellitus type 2, uncontrolled with hyperglycemia Elevated CBG most likely due to steroids.  HbA1c 8.9 in July.  Patient was on Lantus and SSI.  Noted to be hypoglycemic this morning.  Likely due to poor oral intake in the setting of insulin administration.  We will not give him his Lantus today.  Decrease the dose significantly.  D50 half ampule x1.  Recheck his CBGs.   Hyperlipidemia Continue statin.  Normocytic anemia Drop in hemoglobin likely dilutional.  No overt bleeding noted.  Baseline hemoglobin between 8 and 9.  He was likely hemoconcentrated to begin with.  Hemoglobin remains  stable.  History of obstructive sleep apnea Cannot use CPAP in this facility.  Continue with oxygen.  Pressure ulcer As per wound care nurse. Pressure Injury 02/08/19 Buttocks Right;Lower Stage II -  Partial thickness loss of dermis presenting as a shallow open ulcer with a red, pink wound bed without slough. (Active)  02/08/19 1610  Location: Buttocks  Location Orientation: Right;Lower  Staging: Stage II -  Partial thickness loss of dermis presenting as a shallow open ulcer with a red, pink wound bed without slough.  Wound Description (Comments):   Present on Admission: Yes     Pressure Injury 02/08/19 Scrotum Posterior;Medial Stage II -  Partial thickness loss of dermis presenting as a shallow open ulcer with a red, pink wound bed without slough. (Active)  02/08/19 1611  Location: Scrotum  Location Orientation: Posterior;Medial  Staging: Stage II -  Partial thickness loss of dermis presenting as a shallow open ulcer with a red, pink wound bed without slough.  Wound Description (Comments):   Present on Admission: Yes     DVT Prophylaxis: On rivaroxaban PUD Prophylaxis: Pepcid Code Status: Full code Family Communication: Son being updated on a daily basis. Disposition Plan: Initially plan was for him to go home.  However he is become delirious.  We will need to wait for this to resolve.  Plus son is now concerned that his mother will not be able to care for patient at home.  So we might have to pursue skilled nursing facility.   Medications:  Scheduled: . amiodarone  200 mg Oral Daily  . aspirin EC  81 mg Oral Daily  . atorvastatin  80 mg Oral QPM  . dexamethasone  2 mg Oral Q breakfast  . diltiazem  120 mg Oral Daily  . famotidine  20 mg Oral Daily  . feeding supplement (ENSURE ENLIVE)  237 mL Oral TID BM  . ferrous sulfate  325 mg Oral Q breakfast  . insulin aspart  0-20 Units Subcutaneous TID WC  . insulin aspart  0-5 Units Subcutaneous QHS  . [START ON 02/14/2019]  insulin glargine  18 Units Subcutaneous Daily  . isosorbide mononitrate  60 mg Oral Daily  . mouth rinse  15 mL Mouth Rinse BID  . metoprolol tartrate  25 mg Oral TID  . multivitamin with minerals  1 tablet Oral Daily  . polyethylene glycol  17 g Oral BID  . QUEtiapine  50 mg Oral BID  . Rivaroxaban  15 mg Oral Q supper  . senna  2 tablet Oral QHS  . silver sulfADIAZINE  1 application Topical BID  . sodium chloride flush  3  mL Intravenous Q12H  . sodium chloride flush  3 mL Intravenous Q12H  . ascorbic acid  500 mg Oral Daily  . zinc sulfate  220 mg Oral Daily   Continuous: . sodium chloride     [id number] chloride, acetaminophen, bisacodyl, chlorpheniramine-HYDROcodone, guaiFENesin-dextromethorphan, haloperidol lactate, haloperidol lactate, hydrALAZINE, HYDROcodone-acetaminophen, nitroGLYCERIN, ondansetron **OR** ondansetron (ZOFRAN) IV, sodium chloride flush   Objective:  Vital Signs  Vitals:   02/12/19 2254 02/13/19 0446 02/13/19 0732 02/13/19 0800  BP:  (!) 82/53  (!) 163/83  Pulse: (!) 53 93  68  Resp:    18  Temp:    98.7 F (37.1 C)  TempSrc:  Oral  Axillary  SpO2:  (!) 87% 96%   Weight:      Height:        Intake/Output Summary (Last 24 hours) at 02/13/2019 0959 Last data filed at 02/13/2019 0453 Gross per 24 hour  Intake 120 ml  Output 1600 ml  Net -1480 ml   Filed Weights   02/08/19 1548  Weight: 78.3 kg    General appearance: Delirious.  Angry.  Swinging his arms at me. Resp: Limited exam but I did not hear any crackles today.   Cardio: Limited exam as patient is agitated but S1-S2 seem to be normal.   GI: Limited exam but abdomen appears to be soft.   Extremities: Bilateral BKA.  Attempts to remove the dressing from the right stump were partially successful due to patient's agitation.  He is swinging at me.  Unable to examine him completely.  However no active drainage noted.  The dressings are noted to be dry. Neurologic: He is delirious.  No focal  deficits present.   Lab Results:  Data Reviewed: I have personally reviewed following labs and imaging studies  CBC: Recent Labs  Lab 02/09/19 0258 02/10/19 0150 02/11/19 0700 02/12/19 0350 02/13/19 0245  WBC 7.1 9.0 11.3* 14.0* 12.1*  NEUTROABS 6.7 8.4* 10.1* 12.1* 9.3*  HGB 9.0* 8.5* 9.9* 9.5* 10.1*  HCT 29.5* 27.1* 31.4* 30.6* 32.1*  MCV 89.7 88.9 88.2 88.7 88.2  PLT 355 375 398 378 [id number]    Basic Metabolic Panel: Recent Labs  Lab 02/09/19 0258 02/09/19 1356 02/10/19 0150 02/11/19 0700 02/12/19 0350 02/13/19 0245  NA 138  --  136 137 138 139  K 5.3* 4.2 4.6 4.2 4.6 3.8  CL 104  --  104 102 103 103  CO2 26  --  24 27 26 26   GLUCOSE 313*  --  299* 253* 227* 69*  BUN 38*  --  43* 41* 43* 35*  CREATININE 1.33*  --  1.08 1.13 1.03 0.97  CALCIUM 8.4*  --  8.4* 8.5* 8.4* 8.3*  MG 1.8  --  1.8 1.8 1.9 1.9    GFR: Estimated Creatinine Clearance: 55.8 mL/min (by C-G formula based on SCr of 0.97 mg/dL).  Liver Function Tests: Recent Labs  Lab 02/09/19 0258 02/10/19 0150 02/11/19 0700 02/12/19 0350 02/13/19 0245  AST 28 30 42* 36 45*  ALT 22 23 38 38 42  ALKPHOS 123 123 120 98 85  BILITOT 0.7 0.7 0.5 0.5 0.7  PROT 6.5 6.3* 6.4* 5.9* 6.1*  ALBUMIN 2.5* 2.6* 2.7* 2.4* 2.4*     Coagulation Profile: Recent Labs  Lab 02/08/19 0557  INR 3.0*     CBG: Recent Labs  Lab 02/12/19 0750 02/12/19 1143 02/12/19 1556 02/12/19 2121 02/13/19 0849  GLUCAP 217* 167* 96 80 66*       Recent Results (  from the past 240 hour(s))  Blood Culture (routine x 2)     Status: None   Collection Time: 02/08/19  5:57 AM   Specimen: BLOOD  Result Value Ref Range Status   Specimen Description BLOOD BLOOD LEFT HAND  Final   Special Requests   Final    BOTTLES DRAWN AEROBIC AND ANAEROBIC Blood Culture adequate volume   Culture   Final    NO GROWTH 5 DAYS Performed at [facility], [address] [place], [place] [address]    Report Status 02/13/2019 FINAL   Final  Blood Culture (routine x 2)     Status: None   Collection Time: 02/08/19  5:57 AM   Specimen: BLOOD  Result Value Ref Range Status   Specimen Description BLOOD RIGHT WRIST  Final   Special Requests   Final    BOTTLES DRAWN AEROBIC AND ANAEROBIC Blood Culture results may not be optimal due to an excessive volume of blood received in culture bottles   Culture   Final    NO GROWTH 5 DAYS Performed at [facility], [address] [place], [place] [address]    Report Status 02/13/2019 FINAL  Final  SARS Coronavirus 2 [facility] order, Performed in [facility] hospital lab) Nasopharyngeal Nasopharyngeal Swab     Status: Abnormal   Collection Time: 02/08/19  6:01 AM   Specimen: Nasopharyngeal Swab  Result Value Ref Range Status   SARS Coronavirus 2 POSITIVE (A) NEGATIVE Final    Comment: RESULT CALLED TO, READ BACK BY AND VERIFIED WITH:  LAURA CATES AT 0735 02/08/2019 SDR (NOTE) If result is NEGATIVE SARS-CoV-2 target nucleic acids are NOT DETECTED. The SARS-CoV-2 RNA is generally detectable in upper and lower  respiratory specimens during the acute phase of infection. The lowest  concentration of SARS-CoV-2 viral copies this assay can detect is 250  copies / mL. A negative result does not preclude SARS-CoV-2 infection  and should not be used as the sole basis for treatment or other  patient management decisions.  A negative result may occur with  improper specimen collection / handling, submission of specimen other  than nasopharyngeal swab, presence of viral mutation(s) within the  areas targeted by this assay, and inadequate number of viral copies  (<250 copies / mL). A negative result must be combined with clinical  observations, patient history, and epidemiological information. If result is POSITIVE SARS-CoV-2 target nucleic acids are DETECTED. Th e SARS-CoV-2 RNA is generally detectable in upper and lower  respiratory specimens during the acute phase of infection.   Positive  results are indicative of active infection with SARS-CoV-2.  Clinical  correlation with patient history and other diagnostic information is  necessary to determine patient infection status.  Positive results do  not rule out bacterial infection or co-infection with other viruses. If result is PRESUMPTIVE POSTIVE SARS-CoV-2 nucleic acids MAY BE PRESENT.   A presumptive positive result was obtained on the submitted specimen  and confirmed on repeat testing.  While 2019 novel coronavirus  (SARS-CoV-2) nucleic acids may be present in the submitted sample  additional confirmatory testing may be necessary for epidemiological  and / or clinical management purposes  to differentiate between  SARS-CoV-2 and other Sarbecovirus currently known to infect humans.  If clinically indicated additional testing with an alternate test  methodology [phone]) is  advised. The SARS-CoV-2 RNA is generally  detectable in upper and lower respiratory specimens during the acute  phase of infection. The expected result is Negative.  Fact Sheet for Patients:  StrictlyIdeas.no Fact Sheet for Healthcare Providers: BankingDealers.co.za This test is not yet approved or cleared by the [place] FDA and has been authorized for detection and/or diagnosis of SARS-CoV-2 by FDA under an Emergency Use Authorization (EUA).  This EUA will remain in effect (meaning this test can be used) for the duration of the COVID-19 declaration under Section 564(b)(1) of the Act, 21 U.S.C. section 360bbb-3(b)(1), unless the authorization is terminated or revoked sooner. Performed at [facility], [address] [place], [place] [address]   MRSA PCR Screening     Status: None   Collection Time: 02/08/19  6:45 PM   Specimen: Nasal Mucosa; Nasopharyngeal  Result Value Ref Range Status   MRSA by PCR NEGATIVE NEGATIVE Final    Comment:        The GeneXpert MRSA Assay  (FDA approved for NASAL specimens only), is one component of a comprehensive MRSA colonization surveillance program. It is not intended to diagnose MRSA infection nor to guide or monitor treatment for MRSA infections. Performed at [facility], [place] [address], [place], [place] [address]       Radiology Studies: No results found.     LOS: 5 days   Corah Willeford [employer] on [email or web address]  02/13/2019, 9:59 AM

## 2019-02-13 NOTE — Plan of Care (Signed)
  Problem: Education: Goal: Knowledge of risk factors and measures for prevention of condition will improve Outcome: Progressing   Problem: Coping: Goal: Psychosocial and spiritual needs will be supported Outcome: Progressing   Problem: Respiratory: Goal: Complications related to the disease process, condition or treatment will be avoided or minimized Outcome: Progressing   Problem: Education: Goal: Knowledge of General Education information will improve Description: Including pain rating scale, medication(s)/side effects and non-pharmacologic comfort measures Outcome: Progressing   Problem: Health Behavior/Discharge Planning: Goal: Ability to manage health-related needs will improve Outcome: Progressing   Problem: Clinical Measurements: Goal: Ability to maintain clinical measurements within normal limits will improve Outcome: Progressing Goal: Will remain free from infection Outcome: Progressing Goal: Diagnostic test results will improve Outcome: Progressing Goal: Respiratory complications will improve Outcome: Progressing Goal: Cardiovascular complication will be avoided Outcome: Progressing   Problem: Nutrition: Goal: Adequate nutrition will be maintained Outcome: Progressing   Problem: Coping: Goal: Level of anxiety will decrease Outcome: Progressing   Problem: Elimination: Goal: Will not experience complications related to bowel motility Outcome: Progressing Goal: Will not experience complications related to urinary retention Outcome: Progressing   Problem: Pain Managment: Goal: General experience of comfort will improve Outcome: Progressing   Problem: Safety: Goal: Ability to remain free from injury will improve Outcome: Progressing   Problem: Skin Integrity: Goal: Risk for impaired skin integrity will decrease Outcome: Progressing

## 2019-02-13 NOTE — Plan of Care (Signed)
  Problem: Education: Goal: Knowledge of risk factors and measures for prevention of condition will improve Outcome: Progressing   Problem: Coping: Goal: Psychosocial and spiritual needs will be supported Outcome: Progressing   Problem: Respiratory: Goal: Complications related to the disease process, condition or treatment will be avoided or minimized Outcome: Progressing   Problem: Education: Goal: Knowledge of General Education information will improve Description: Including pain rating scale, medication(s)/side effects and non-pharmacologic comfort measures Outcome: Progressing   Problem: Health Behavior/Discharge Planning: Goal: Ability to manage health-related needs will improve Outcome: Progressing   Problem: Clinical Measurements: Goal: Ability to maintain clinical measurements within normal limits will improve Outcome: Progressing Goal: Will remain free from infection Outcome: Progressing Goal: Diagnostic test results will improve Outcome: Progressing Goal: Respiratory complications will improve Outcome: Progressing Goal: Cardiovascular complication will be avoided Outcome: Progressing   Problem: Activity: Goal: Risk for activity intolerance will decrease Outcome: Progressing   Problem: Nutrition: Goal: Adequate nutrition will be maintained Outcome: Progressing   Problem: Coping: Goal: Level of anxiety will decrease Outcome: Progressing   Problem: Elimination: Goal: Will not experience complications related to bowel motility Outcome: Progressing Goal: Will not experience complications related to urinary retention Outcome: Progressing   Problem: Pain Managment: Goal: General experience of comfort will improve Outcome: Progressing   Problem: Safety: Goal: Ability to remain free from injury will improve Outcome: Progressing   Problem: Skin Integrity: Goal: Risk for impaired skin integrity will decrease Outcome: Progressing   

## 2019-02-14 ENCOUNTER — Telehealth (INDEPENDENT_AMBULATORY_CARE_PROVIDER_SITE_OTHER): Payer: Self-pay

## 2019-02-14 LAB — CBC
HCT: 35.4 % — ABNORMAL LOW (ref 39.0–52.0)
Hemoglobin: 11 g/dL — ABNORMAL LOW (ref 13.0–17.0)
MCH: 27.8 pg (ref 26.0–34.0)
MCHC: 31.1 g/dL (ref 30.0–36.0)
MCV: 89.4 fL (ref 80.0–100.0)
Platelets: 408 10*3/uL — ABNORMAL HIGH (ref 150–400)
RBC: 3.96 MIL/uL — ABNORMAL LOW (ref 4.22–5.81)
RDW: 16.1 % — ABNORMAL HIGH (ref 11.5–15.5)
WBC: 10 10*3/uL (ref 4.0–10.5)
nRBC: 0 % (ref 0.0–0.2)

## 2019-02-14 LAB — GLUCOSE, CAPILLARY
Glucose-Capillary: 172 mg/dL — ABNORMAL HIGH (ref 70–99)
Glucose-Capillary: 193 mg/dL — ABNORMAL HIGH (ref 70–99)
Glucose-Capillary: 149 mg/dL — ABNORMAL HIGH (ref 70–99)
Glucose-Capillary: 290 mg/dL — ABNORMAL HIGH (ref 70–99)

## 2019-02-14 LAB — BASIC METABOLIC PANEL
Anion gap: 11 (ref 5–15)
BUN: 38 mg/dL — ABNORMAL HIGH (ref 8–23)
CO2: 26 mmol/L (ref 22–32)
Calcium: 8.5 mg/dL — ABNORMAL LOW (ref 8.9–10.3)
Chloride: 103 mmol/L (ref 98–111)
Creatinine, Ser: 1.11 mg/dL (ref 0.61–1.24)
GFR calc Af Amer: >60 mL/min (ref >60)
GFR calc non Af Amer: >60 mL/min (ref >60)
Glucose, Bld: 176 mg/dL — ABNORMAL HIGH (ref 70–99)
Potassium: 4.4 mmol/L (ref 3.5–5.1)
Sodium: 140 mmol/L (ref 135–145)

## 2019-02-14 LAB — MAGNESIUM: Magnesium: 2.1 mg/dL (ref 1.7–2.4)

## 2019-02-14 MED ORDER — DEXAMETHASONE 0.5 MG PO TABS
0.5000 mg | ORAL_TABLET | Freq: Every day | ORAL | Status: DC
  Filled 2019-02-14: qty 1

## 2019-02-14 MED ORDER — MAGNESIUM OXIDE 400 (241.3 MG) MG PO TABS
400.0000 mg | ORAL_TABLET | Freq: Two times a day (BID) | ORAL | Status: AC
  Administered 2019-02-14 (×2): 400 mg via ORAL
  Filled 2019-02-14 (×2): qty 1

## 2019-02-14 MED ORDER — ZIPRASIDONE MESYLATE 20 MG IM SOLR
10.0000 mg | Freq: Four times a day (QID) | INTRAMUSCULAR | Status: DC | PRN
  Administered 2019-02-14 – 2019-02-15 (×3): 10 mg via INTRAMUSCULAR
  Filled 2019-02-14 (×4): qty 20

## 2019-02-14 MED ORDER — COLLAGENASE 250 UNIT/GM EX OINT
TOPICAL_OINTMENT | Freq: Every day | CUTANEOUS | Status: DC
  Administered 2019-02-14: 18:00:00 via TOPICAL
  Administered 2019-02-15: 1 via TOPICAL
  Administered 2019-02-16 – 2019-02-19 (×4): via TOPICAL
  Filled 2019-02-14: qty 30

## 2019-02-14 MED ORDER — DIVALPROEX SODIUM ER 250 MG PO TB24
250.0000 mg | ORAL_TABLET | Freq: Every day | ORAL | Status: DC
  Administered 2019-02-14 – 2019-02-15 (×2): 250 mg via ORAL
  Filled 2019-02-14 (×3): qty 1

## 2019-02-14 NOTE — Progress Notes (Signed)
Continues to be agitated unable to get ekg. Informed charge nurse, will attempt again,  before end of shift.

## 2019-02-14 NOTE — Telephone Encounter (Signed)
Wound Ostomy nurse Wetzel Bjornstad) from [facility] called stating the patient had a right BKA 07/01/2018 and the patient missed last appointment due to being hospitalized for COVID. The nurse visualize the suture sight and it looks like the staples are ready to be come out.The wound is 90% eschar and would like to change orders to using santyl and stop silvadene.  The nurse is requesting to remove staples and change wound orders.I have spoke with Eulogio Ditch NP and she advise that the staples can be removed and the nurse can start applying Santyl to the wound. The nurse has been made aware with medical information .

## 2019-02-14 NOTE — Progress Notes (Signed)
[name] (son) updated. All questions were answered. No other needs were mentioned at this time.

## 2019-02-14 NOTE — Progress Notes (Signed)
Nutrition Follow-up RD working remotely.  DOCUMENTATION CODES:   Not applicable  INTERVENTION:    If within goals of care, recommend Cortrak feeding tube placement with bridle to secure tube. Current oral intake is not adequate to meet nutrition needs. Glucerna 1.2 at 75 ml/h with Pro-stat 30 ml BID would provide 2360 kcal, 138 gm protein, 1449 ml free water daily.   Continue to offer Ensure Enlive po TID, each supplement provides 350 kcal and 20 grams of protein.   Continue MVI with minerals daily.   Continue Hormel Shake daily with Breakfast which provides 520 kcals and 22 g of protein and Magic cup BID with lunch and dinner, each supplement provides 290 kcal and 9 grams of protein, automatically on meal trays to optimize nutritional intake.   NUTRITION DIAGNOSIS:   Increased nutrient needs related to acute illness, wound healing(COVID) as evidenced by estimated needs.  Ongoing   GOAL:   Patient will meet greater than or equal to 90% of their needs  Unmet  MONITOR:   PO intake, Supplement acceptance, Labs, Skin  ASSESSMENT:   84 yo male admitted with SOB, PNA, COVID-19 positive. PMH includes prostate cancer, CHF, DM, HTN, sleep apnea, A fib, PVD, HLD, CKD, L BKA, R BKA.  PO intake remains poor, average meal completion is ~25%. He is mostly refusing Ensure Enlive supplements. Poor intake is likely related to dementia and agitation.   R stump wound is not improving. WOC RN saw patient today. Patient is not meeting nutrition needs to support wound healing.    Labs reviewed.  CBG's: [phone]  Medications reviewed and include ferrous sulfate, novolog, lantus, mag-ox, MVI, vitamin C, zinc sulfate.  Diet Order:   Diet Order            Diet Carb Modified Fluid consistency: Thin; Room service appropriate? Yes  Diet effective now              EDUCATION NEEDS:   Not appropriate for education at this time  Skin:  Skin Assessment: Skin Integrity Issues: Skin  Integrity Issues:: Stage II, Other (Comment), DTI DTI: perineum Stage II: buttocks, scrotum Other: R stump wound, R knee full thickness wound  Last BM:  9/22 type 5  Height:   Ht Readings from Last 1 Encounters:  02/08/19 5\' 7"  (1.702 m)    Weight:   Wt Readings from Last 1 Encounters:  02/08/19 78.3 kg    Ideal Body Weight:  58.6 kg  BMI:  Body mass index is 27.04 kg/m.  Estimated Nutritional Needs:   Kcal:  2150-2350  Protein:  115-140 gm  Fluid:  >/= 2 L    Molli Barrows, RD, LDN, [place] Pager [phone] After Hours Pager [phone]

## 2019-02-14 NOTE — Consult Note (Signed)
[place] Nurse wound consult note Remote consult via FaceTIme with Caitlyn, RN.  Reason for Consult:Right AKA stump, not improving.  Message left for [facility] on nurse line requesting to write order to remove staples.  Patient missed appointment.  Staples are lifting. Wound has not improved with silvadene. Will switch topical therapy to [facility] for enzymatic debridement twice daily.   Wound type: Surgical BKA site.  Pressure Injury POA: Yes Measurement:Buttock wounds improving with offloading and Xeroform gauze.   Right AKA site:  4 cm x 10 cm with 90% thin eschar 10% Yellow fibrin noted at wound periphery.  Wound bed:see above, devitalized Drainage (amount, consistency, odor) minimal serosanguinous necrotic odor.  Periwound: soft and erythematous Dressing procedure/placement/frequency: Cleanse right BKA stump with Ns and pat dry.  Apply Santyl to wound bed. Cover with NS moist kerlix.  Secure with ABD and kerlix.  Change each shift.  Will not follow at this time.  Please re-consult if needed.  [name] MSN, RN, FNP-BC CWON Wound, Ostomy, Continence Nurse Pager [phone]

## 2019-02-14 NOTE — Progress Notes (Signed)
This note also relates to the following rows which could not be included: Pulse Rate - Cannot attach notes to unvalidated device data ECG Heart Rate - Cannot attach notes to unvalidated device data SpO2 - Cannot attach notes to unvalidated device data  This column copied to correct time in Flowsheet.

## 2019-02-14 NOTE — Progress Notes (Signed)
Pt bathed and fed. Left wound open to air, not odorous minimal drainage. Administered foam dressing on the buttocks. Both areas are improving staff will continue to monitor and his needs.

## 2019-02-14 NOTE — Progress Notes (Signed)
[facility] PROGRESS NOTE    Progress Note  [name]  [id number] DOB: 1934-06-09 DOA: 02/08/2019 PCP: Corey Skains, MD     Brief Narrative:   [name] is an 84 y.o. 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  EMS was called in the ED he was found to have right lower lobe infiltrate was not noted to be hypoxic subsequently check for COVID-19 which came back positive and transferred to [facility].  Assessment/Plan:   Acute respiratory failure with hypoxia due to Pneumonia due to COVID-19 virus: He has remained afebrile, on room air saturation > 90%. Complete his course of IV Remdesivir, he is slowly be weaned down from his dexamethasone. Continue vitamin C and zinc. Currently on Xarelto at home. His inflammatory markers continue to improve nicely. However during physical exam he is agitated and not very comparative.  Acute metabolic encephalopathy/acute confusional state: Likely multifactorial in the setting of a new room, acute illness steroids in the setting of dementia. Continue Seroquel at bedtime, d/c Haldol QTc 530. Will use Depakote for agitation.  Stable angina in the setting of known coronary artery disease: Patient had anginal chest pain on 02/09/2019 relieved by nitroglycerin with minimal elevation in cardiac biomarkers, the case was discussed with cardiology as the patient did have EKG changes.  Patient is currently on beta-blocker which has been  increased, Imdur, aspirin and he is on anticoagulation with Xarelto.  The patient will follow-up with cardiology in [facility]. Continue beta-blocker, aspirin, Lipitor and Imdur.  Recent right BKA with stump wound: Followed by vascular surgery at [facility] Dr. Lucky Cowboy, last seen in their office on 02/09/2019, where he was placed on Bactrim for which he has completed his treatment.   Continue dressing changes and silver sulfadiazine cream. Reconsult wound care to take a look at the right BKA stump.  Mild acute kidney injury on chronic kidney disease stage III: With a baseline creatinine of 1.3, now 1.1. Now resolved.  Chronic systolic heart failure: With an echocardiogram this year showing EF of 45%. BNP was elevated in the ED and appears to be volume overloaded he was given Lasix, with good urine output.  He has now remained euvolemic.  Essential hypertension: Continue current home regimen his blood pressure has been elevated intermittently in the hospital likely due to agitation.  History of paroxysmal atrial fibrillation: continue diltiazem metoprolol and amiodarone he is currently rate controlled continue Xarelto.  Diabetes mellitus type 2 uncontrolled with hyperglycemia: With an A1c of 8.9 in July, continue Lantus plus sliding scale.  Has had episode of hypoglycemia likely due to poor oral intake, we will hold his Lantus today.  Hyperlipidemia: Continue statins.  Normocytic anemia: The drop in hemoglobin likely due to dilutional effect.  His hemoglobin has remained stable.  History of obstructive sleep apnea: Continue oxygen.  Pressure ulcer present on admission: RN Pressure Injury Documentation: Pressure Injury 10/16/18 Perineum Medial;Lower Deep Tissue Injury - Purple or maroon localized area of discolored intact skin or blood-filled blister due to damage of underlying soft tissue from  pressure and/or shear. skin between scrotum and anus (Active)  10/16/18 1118   Location: Perineum  Location Orientation: Medial;Lower  Staging: Deep Tissue Injury - Purple or maroon localized area of discolored intact skin or blood-filled blister due to damage of underlying soft tissue from pressure and/or shear.  Wound Description (Comments): skin between scrotum and anus  Present on Admission:      Pressure Injury 12/20/18 Buttocks Left;Posterior Stage II -  Partial thickness loss of dermis presenting as a shallow open ulcer with a red, pink wound bed without slough. (Active)  12/20/18 2000  Location: Buttocks  Location Orientation: Left;Posterior  Staging: Stage II -  Partial thickness loss of dermis presenting as a shallow open ulcer with a red, pink wound bed without slough.  Wound Description (Comments):   Present on Admission: Yes     Pressure Injury 02/08/19 Buttocks Right;Lower Stage II -  Partial thickness loss of dermis presenting as a shallow open ulcer with a red, pink wound bed without slough. (Active)  02/08/19 1610  Location: Buttocks  Location Orientation: Right;Lower  Staging: Stage II -  Partial thickness loss of dermis presenting as a shallow open ulcer with a red, pink wound bed without slough.  Wound Description (Comments):   Present on Admission: Yes     Pressure Injury 02/08/19 Scrotum Posterior;Medial Stage II -  Partial thickness loss of dermis presenting as a shallow open ulcer with a red, pink wound bed without slough. (Active)  02/08/19 1611  Location: Scrotum  Location Orientation: Posterior;Medial  Staging: Stage II -  Partial thickness loss of dermis presenting as a shallow open ulcer with a red, pink wound bed without slough.  Wound Description (Comments):   Present on Admission: Yes    Estimated body mass index is 27.04 kg/m as calculated from the following:   Height as of this encounter: 5\' 7"  (1.702 m).   Weight as of this encounter: 78.3 kg. Malnutrition Type:  Nutrition Problem: Increased nutrient needs Etiology:  acute illness, wound healing(COVID)   Malnutrition Characteristics:  Signs/Symptoms: estimated needs   Nutrition Interventions:  Interventions: MVI, Ensure Enlive (each supplement provides 350kcal and 20 grams of protein)    DVT prophylaxis: lovenox Family Communication:none Disposition Plan/Barrier to D/C: SNF soon Code Status:     Code Status Orders  (From admission, onward)         Start     Ordered   02/08/19 1633  Full code  Continuous     02/08/19 1637        Code Status History    Date Active Date Inactive Code Status Order ID Comments User Context   12/20/2018 1009 01/05/2019 2212 Full Code [id number]  [name], MD ED   12/18/2018 1600 12/18/2018 2113 Full Code [id number]  Algernon Huxley, MD Inpatient   10/13/2018 1145 10/18/2018 1443 Full Code [id number]  [name] Mandes, MD Inpatient   06/03/2018 0301 06/09/2018 2013 Full Code [id number]  Lance Coon, MD Inpatient   01/16/2018 1515 01/18/2018 1635 Full Code [id number]  Dustin Flock, MD ED   11/23/2017 1346 11/28/2017 2020 Full Code [id number]  Nicholes Mango, MD Inpatient   10/26/2017 1534 10/28/2017 1854 Full Code [id number]  Bettey Costa, MD Inpatient   10/26/2017 1437 10/26/2017 1534 Full Code [id number]  Feliberto Gottron, Talty Inpatient   10/24/2017 1152 10/26/2017 1437 DNR [id number]  Bettey Costa, MD Inpatient   10/15/2017 0309 10/24/2017 1152 Full Code [id number]  Arta Silence, MD Inpatient   02/10/2017 1904 02/12/2017 1908 Full  Code [id number]  Katha Cabal, MD ED   02/10/2017 1638 02/10/2017 1904 Full Code [id number]  Leonides Schanz Outpatient   01/08/2017 1342 01/19/2017 2103 Full Code [id number]  Loletha Grayer, MD ED   12/29/2016 2232 12/30/2016 1634 Full Code [id number]  Lance Coon, MD ED   12/20/2016 1251 12/20/2016 1842 Full Code [id number]  Algernon Huxley, MD Inpatient   10/22/2016 0040 10/24/2016 2058 Full Code [id number]  Lance Coon, MD Inpatient   10/14/2016 1201 10/14/2016 1925 Full Code [id number]  Algernon Huxley, MD Inpatient   08/30/2016 1338 09/06/2016 1941 Full Code [id number]  Epifanio Lesches, MD Inpatient   05/19/2016 0454 05/22/2016 1907 Full Code [id number]  Saundra Shelling, MD Inpatient   Advance Care Planning Activity    Advance Directive Documentation     Most Recent Value  Type of Advance Directive  Healthcare Power of Attorney, Living will, Out of facility DNR (pink MOST or yellow form)  Pre-existing out of facility DNR order (yellow form or pink MOST form)  -  "MOST" Form in Place?  -        IV Access:    Peripheral IV   Procedures and diagnostic studies:   No results found.   Medical Consultants:    None.  Anti-Infectives:   none  Subjective:    [name] complaining of pain in his right lower extremity  Objective:    Vitals:   02/13/19 1924 02/13/19 2124 02/14/19 0440 02/14/19 0757  BP: (!) 177/79  129/73 (!) 156/66  Pulse: 70 (!) 58  (!) 51  Resp: 17   15  Temp: 97.9 F (36.6 C)   98.4 F (36.9 C)  TempSrc: Axillary   Oral  SpO2: 100%   100%  Weight:      Height:       SpO2: 100 % O2 Flow Rate (L/min): 2 L/min   Intake/Output Summary (Last 24 hours) at 02/14/2019 0912 Last data filed at 02/14/2019 [id number] Gross per 24 hour  Intake 10 ml  Output 475 ml  Net -465 ml   Filed Weights   02/08/19 1548  Weight: 78.3 kg    Exam: General exam: In no acute distress. Respiratory system: Good air movement and clear to auscultation. Cardiovascular system: S1 & S2 heard, RRR. No JVD. Gastrointestinal system: Abdomen is nondistended, soft and nontender.  Central nervous system: Alert and oriented x 2. No focal neurological deficits. Extremities: Bilateral lower extremity BKA's, the left is healed the right looks slightly erythematous no drainage no foul-smelling. Skin: No rashes, lesions or ulcers    Data Reviewed:    Labs: Basic Metabolic Panel: Recent Labs  Lab 02/09/19 0258  02/10/19 0150 02/11/19 0700  02/12/19 0350 02/13/19 0245 02/14/19 0400  NA 138  --  136 137 138 139 140  K 5.3*   < > 4.6 4.2 4.6 3.8 4.4  CL 104  --  104 102 103 103 103  CO2 26  --  24 27 26 26 26   GLUCOSE 313*  --  299* 253* 227* 69* 176*  BUN 38*  --  43* 41* 43* 35* 38*  CREATININE 1.33*  --  1.08 1.13 1.03 0.97 1.11  CALCIUM 8.4*  --  8.4* 8.5* 8.4* 8.3* 8.5*  MG 1.8  --  1.8 1.8 1.9 1.9  --    < > = values in this interval not displayed.   GFR Estimated Creatinine Clearance: 48.8 mL/min (by C-G formula based on SCr of  1.11 mg/dL). Liver Function Tests: Recent Labs  Lab 02/09/19 0258 02/10/19 0150 02/11/19 0700 02/12/19 0350 02/13/19 0245  AST 28 30 42* 36 45*  ALT 22 23 38 38 42  ALKPHOS 123 123 120 98 85  BILITOT 0.7 0.7 0.5 0.5 0.7  PROT 6.5 6.3* 6.4* 5.9* 6.1*  ALBUMIN 2.5* 2.6* 2.7* 2.4* 2.4*   No results for input(s): LIPASE, AMYLASE in the last 168 hours. No results for input(s): AMMONIA in the last 168 hours. Coagulation profile Recent Labs  Lab 02/08/19 0557  INR 3.0*   COVID-19 Labs  No results for input(s): DDIMER, FERRITIN, LDH, CRP in the last 72 hours.  Lab Results  Component Value Date   SARSCOV2NAA POSITIVE (A) 02/08/2019   [place] NEGATIVE 01/05/2019   [place] NEGATIVE 12/20/2018   [place] NEGATIVE 12/15/2018    CBC: Recent Labs  Lab 02/09/19 0258 02/10/19 0150 02/11/19 0700 02/12/19 0350 02/13/19 0245 02/14/19 0400  WBC 7.1 9.0 11.3* 14.0* 12.1* 10.0  NEUTROABS 6.7 8.4* 10.1* 12.1* 9.3*  --   HGB 9.0* 8.5* 9.9* 9.5* 10.1* 11.0*  HCT 29.5* 27.1* 31.4* 30.6* 32.1* 35.4*  MCV 89.7 88.9 88.2 88.7 88.2 89.4  PLT 355 375 398 378 372 408*   Cardiac Enzymes: No results for input(s): CKTOTAL, CKMB, CKMBINDEX, TROPONINI in the last 168 hours. BNP (last 3 results) No results for input(s): PROBNP in the last 8760 hours. CBG: Recent Labs  Lab 02/13/19 0849 02/13/19 1215 02/13/19 1613 02/13/19 2106 02/14/19 0819  GLUCAP 66* 76 97 130* 172*    D-Dimer: No results for input(s): DDIMER in the last 72 hours. Hgb A1c: No results for input(s): HGBA1C in the last 72 hours. Lipid Profile: No results for input(s): CHOL, HDL, LDLCALC, TRIG, CHOLHDL, LDLDIRECT in the last 72 hours. Thyroid function studies: No results for input(s): TSH, T4TOTAL, T3FREE, THYROIDAB in the last 72 hours.  Invalid input(s): FREET3 Anemia work up: No results for input(s): VITAMINB12, FOLATE, FERRITIN, TIBC, IRON, RETICCTPCT in the last 72 hours. Sepsis Labs: Recent Labs  Lab 02/08/19 0557 02/08/19 0927 02/08/19 1707  02/11/19 0700 02/12/19 0350 02/13/19 0245 02/14/19 0400  PROCALCITON  --   --  0.54  --   --   --   --   --   WBC  --   --   --    < > 11.3* 14.0* 12.1* 10.0  LATICACIDVEN 2.4* 2.3*  --   --   --   --   --   --    < > = values in this interval not displayed.   Microbiology Recent Results (from the past 240 hour(s))  Blood Culture (routine x 2)     Status: None   Collection Time: 02/08/19  5:57 AM   Specimen: BLOOD  Result Value Ref Range Status   Specimen Description BLOOD BLOOD LEFT HAND  Final   Special Requests   Final    BOTTLES DRAWN AEROBIC AND ANAEROBIC Blood Culture adequate volume   Culture   Final    NO GROWTH 5 DAYS Performed at [facility], [address] [place], [place] [address]    Report Status 02/13/2019 FINAL  Final  Blood Culture (routine x 2)     Status: None   Collection Time: 02/08/19  5:57 AM   Specimen: BLOOD  Result Value Ref Range Status   Specimen Description BLOOD RIGHT WRIST  Final   Special Requests   Final    BOTTLES DRAWN AEROBIC AND ANAEROBIC Blood  Culture results may not be optimal due to an excessive volume of blood received in culture bottles   Culture   Final    NO GROWTH 5 DAYS Performed at [facility], [address] [place], [place] [address]    Report Status 02/13/2019 FINAL  Final  SARS Coronavirus 2 [facility] order, Performed in [facility] hospital  lab) Nasopharyngeal Nasopharyngeal Swab     Status: Abnormal   Collection Time: 02/08/19  6:01 AM   Specimen: Nasopharyngeal Swab  Result Value Ref Range Status   SARS Coronavirus 2 POSITIVE (A) NEGATIVE Final    Comment: RESULT CALLED TO, READ BACK BY AND VERIFIED WITH:  LAURA CATES AT 0735 02/08/2019 SDR (NOTE) If result is NEGATIVE SARS-CoV-2 target nucleic acids are NOT DETECTED. The SARS-CoV-2 RNA is generally detectable in upper and lower  respiratory specimens during the acute phase of infection. The lowest  concentration of SARS-CoV-2 viral copies this assay can detect is 250  copies / mL. A negative result does not preclude SARS-CoV-2 infection  and should not be used as the sole basis for treatment or other  patient management decisions.  A negative result may occur with  improper specimen collection / handling, submission of specimen other  than nasopharyngeal swab, presence of viral mutation(s) within the  areas targeted by this assay, and inadequate number of viral copies  (<250 copies / mL). A negative result must be combined with clinical  observations, patient history, and epidemiological information. If result is POSITIVE SARS-CoV-2 target nucleic acids are DETECTED. Th e SARS-CoV-2 RNA is generally detectable in upper and lower  respiratory specimens during the acute phase of infection.  Positive  results are indicative of active infection with SARS-CoV-2.  Clinical  correlation with patient history and other diagnostic information is  necessary to determine patient infection status.  Positive results do  not rule out bacterial infection or co-infection with other viruses. If result is PRESUMPTIVE POSTIVE SARS-CoV-2 nucleic acids MAY BE PRESENT.   A presumptive positive result was obtained on the submitted specimen  and confirmed on repeat testing.  While 2019 novel coronavirus  (SARS-CoV-2) nucleic acids may be present in the submitted sample  additional  confirmatory testing may be necessary for epidemiological  and / or clinical management purposes  to differentiate between  SARS-CoV-2 and other Sarbecovirus currently known to infect humans.  If clinically indicated additional testing with an alternate test  methodology [phone]) is  advised. The SARS-CoV-2 RNA is generally  detectable in upper and lower respiratory specimens during the acute  phase of infection. The expected result is Negative. Fact Sheet for Patients:  StrictlyIdeas.no Fact Sheet for Healthcare Providers: BankingDealers.co.za This test is not yet approved or cleared by the [place] FDA and has been authorized for detection and/or diagnosis of SARS-CoV-2 by FDA under an Emergency Use Authorization (EUA).  This EUA will remain in effect (meaning this test can be used) for the duration of the COVID-19 declaration under Section 564(b)(1) of the Act, 21 U.S.C. section 360bbb-3(b)(1), unless the authorization is terminated or revoked sooner. Performed at [facility], [address] [place],  [address]   MRSA PCR Screening     Status: None   Collection Time: 02/08/19  6:45 PM   Specimen: Nasal Mucosa; Nasopharyngeal  Result Value Ref Range Status   MRSA by PCR NEGATIVE NEGATIVE Final    Comment:        The GeneXpert MRSA Assay (FDA approved for NASAL specimens only),  is one component of a comprehensive MRSA colonization surveillance program. It is not intended to diagnose MRSA infection nor to guide or monitor treatment for MRSA infections. Performed at [facility], [place] [address] [place], [place] [address]      Medications:   . amiodarone  200 mg Oral Daily  . aspirin EC  81 mg Oral Daily  . atorvastatin  80 mg Oral QPM  . dexamethasone  2 mg Oral Q breakfast  . dextrose  25 mL Intravenous Once  . diltiazem  120 mg Oral Daily  . famotidine  20 mg Oral Daily  .  feeding supplement (ENSURE ENLIVE)  237 mL Oral TID BM  . ferrous sulfate  325 mg Oral Q breakfast  . insulin aspart  0-20 Units Subcutaneous TID WC  . insulin aspart  0-5 Units Subcutaneous QHS  . insulin glargine  18 Units Subcutaneous Daily  . isosorbide mononitrate  60 mg Oral Daily  . mouth rinse  15 mL Mouth Rinse BID  . metoprolol tartrate  25 mg Oral TID  . multivitamin with minerals  1 tablet Oral Daily  . polyethylene glycol  17 g Oral BID  . QUEtiapine  50 mg Oral BID  . Rivaroxaban  15 mg Oral Q supper  . senna  2 tablet Oral QHS  . silver sulfADIAZINE  1 application Topical BID  . sodium chloride flush  3 mL Intravenous Q12H  . sodium chloride flush  3 mL Intravenous Q12H  . ascorbic acid  500 mg Oral Daily  . zinc sulfate  220 mg Oral Daily   Continuous Infusions: . sodium chloride       LOS: 6 days   Charlynne Cousins  [facility]  02/14/2019, 9:12 AM

## 2019-02-15 LAB — GLUCOSE, CAPILLARY
Glucose-Capillary: 128 mg/dL — ABNORMAL HIGH (ref 70–99)
Glucose-Capillary: 131 mg/dL — ABNORMAL HIGH (ref 70–99)
Glucose-Capillary: 80 mg/dL (ref 70–99)
Glucose-Capillary: 102 mg/dL — ABNORMAL HIGH (ref 70–99)

## 2019-02-15 MED ORDER — LORAZEPAM 2 MG/ML IJ SOLN: 2.0000 mg | INTRAMUSCULAR | Status: DC | PRN

## 2019-02-15 MED ORDER — LIP MEDEX EX OINT
1.0000 "application " | TOPICAL_OINTMENT | CUTANEOUS | Status: DC | PRN
  Filled 2019-02-15: qty 7

## 2019-02-15 MED ORDER — DIVALPROEX SODIUM ER 500 MG PO TB24
500.0000 mg | ORAL_TABLET | Freq: Every day | ORAL | Status: DC
  Administered 2019-02-16 – 2019-02-19 (×4): 500 mg via ORAL
  Filled 2019-02-15 (×5): qty 1

## 2019-02-15 NOTE — Progress Notes (Addendum)
[facility] PROGRESS NOTE    Progress Note  Ajavion Feltus  [id number] DOB: 1935/05/07 DOA: 02/08/2019 PCP: Corey Skains, MD     Brief Narrative:   [name] is an 84 y.o. 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  EMS was called in the ED he was found to have right lower lobe infiltrate was not noted to be hypoxic subsequently check for COVID-19 which came back positive and transferred to [facility].  Assessment/Plan:   Acute respiratory failure with hypoxia due to Pneumonia due to COVID-19 virus: He has remained afebrile with no leukocytosis, he is saturating greater 90% on room air. He is complete his course of IV Remdesivir and steroids. We will discontinue vitamin C and zinc. Continue Xarelto at home dose.  Acute metabolic encephalopathy/acute confusional state: Multifactorial in the setting of admission with an acute illness, steroids and baseline dementia. Seroquel was discontinued due to his QTC prolonging. She is now on Depakote has been sitter free since this morning, from a medical standpoint he is ready to be discharged to skilled nursing facility.  Awaiting social worker consult for placement.   Stable angina in the setting of known coronary artery disease: Patient had anginal chest pain on 02/09/2019 relieved by nitroglycerin, it was discussed with cardiology as the patient had not had EKG changes they recommended conservative management, will continue  beta-blockers, Imdur aspirin and Xarelto. We will also continue Lipitor.  Recent right BKA with stump wound: Followed by vascular surgery at [facility] Dr. Lucky Cowboy, last seen in their office on 02/09/2019, where he was placed on Bactrim for which he has completed his treatment.   Continue dressing changes and silver sulfadiazine cream. Reconsult wound care to take a look at the right BKA stump, they recommended to use Santyl to stop Silvadane and to remove the staples.  Mild acute kidney injury on chronic kidney disease stage III: With a baseline creatinine of 1.3, now 1.1. Now resolved.  Chronic systolic heart failure: With an echocardiogram this year showing EF of 45%. BNP was elevated in the ED and appears to be volume overloaded he was given Lasix, with good urine output.  He has now remained euvolemic.  Essential hypertension: Continue current home regimen his blood pressure has been elevated intermittently in the hospital likely due to agitation.  History of paroxysmal atrial fibrillation: continue diltiazem metoprolol and amiodarone he is currently rate controlled continue Xarelto.  Diabetes mellitus type 2 uncontrolled with hyperglycemia: With an A1c of 8.9 in July, continue Lantus plus sliding scale.  Has had episode of hypoglycemia likely due to poor oral intake, we will hold his Lantus today.  Hyperlipidemia: Continue statins.  Normocytic anemia: The drop in hemoglobin likely due to dilutional effect.  His hemoglobin has remained stable.  History of obstructive sleep apnea: Continue oxygen.  Pressure ulcer present on admission: RN Pressure Injury Documentation: Pressure Injury 10/16/18 Perineum Medial;Lower Deep Tissue Injury - Purple or maroon localized area of discolored intact skin or blood-filled blister due to damage of underlying soft tissue from pressure and/or shear.  skin between scrotum and anus (Active)  10/16/18 1118  Location: Perineum  Location Orientation:  Medial;Lower  Staging: Deep Tissue Injury - Purple or maroon localized area of discolored intact skin or blood-filled blister due to damage of underlying soft tissue from pressure and/or shear.  Wound Description (Comments): skin between scrotum and anus  Present on Admission:      Pressure Injury 12/20/18 Buttocks Left;Posterior Stage II -  Partial thickness loss of dermis presenting as a shallow open ulcer with a red, pink wound bed without slough. (Active)  12/20/18 2000  Location: Buttocks  Location Orientation: Left;Posterior  Staging: Stage II -  Partial thickness loss of dermis presenting as a shallow open ulcer with a red, pink wound bed without slough.  Wound Description (Comments):   Present on Admission: Yes     Pressure Injury 02/08/19 Buttocks Right;Lower Stage II -  Partial thickness loss of dermis presenting as a shallow open ulcer with a red, pink wound bed without slough. (Active)  02/08/19 1610  Location: Buttocks  Location Orientation: Right;Lower  Staging: Stage II -  Partial thickness loss of dermis presenting as a shallow open ulcer with a red, pink wound bed without slough.  Wound Description (Comments):   Present on Admission: Yes     Pressure Injury 02/08/19 Scrotum Posterior;Medial Stage II -  Partial thickness loss of dermis presenting as a shallow open ulcer with a red, pink wound bed without slough. (Active)  02/08/19 1611  Location: Scrotum  Location Orientation: Posterior;Medial  Staging: Stage II -  Partial thickness loss of dermis presenting as a shallow open ulcer with a red, pink wound bed without slough.  Wound Description (Comments):   Present on Admission: Yes    Estimated body mass index is 27.04 kg/m as calculated from the following:   Height as of this encounter: 5\' 7"  (1.702 m).   Weight as of this encounter: 78.3 kg. Malnutrition Type:  Nutrition Problem: Increased nutrient needs Etiology: acute illness, wound  healing(COVID)   Malnutrition Characteristics:  Signs/Symptoms: estimated needs   Nutrition Interventions:  Interventions: MVI, Ensure Enlive (each supplement provides 350kcal and 20 grams of protein)    DVT prophylaxis: lovenox Family Communication:none Disposition Plan/Barrier to D/C: SNF in am Code Status:     Code Status Orders  (From admission, onward)         Start     Ordered   02/08/19 1633  Full code  Continuous     02/08/19 1637        Code Status History    Date Active Date Inactive Code Status Order ID Comments User Context   12/20/2018 1009 01/05/2019 2212 Full Code [id number]  [name], MD ED   12/18/2018 1600 12/18/2018 2113 Full Code [id number]  Algernon Huxley, MD Inpatient   10/13/2018 1145 10/18/2018 1443 Full Code [id number]  [name] Mandes, MD Inpatient   06/03/2018 0301 06/09/2018 2013 Full Code [id number]  Lance Coon, MD Inpatient   01/16/2018 1515 01/18/2018 1635 Full Code [id number]  Dustin Flock, MD ED   11/23/2017 1346 11/28/2017 2020 Full Code [id number]  Nicholes Mango, MD Inpatient   10/26/2017 1534 10/28/2017 1854 Full Code [id number]  Bettey Costa, MD Inpatient   10/26/2017 1437 10/26/2017 1534 Full Code [id number]  Feliberto Gottron, RN Inpatient   10/24/2017 1152 10/26/2017 1437 DNR [id number]  Bettey Costa, MD Inpatient   10/15/2017 0309 10/24/2017 1152 Full Code [id number]  Arta Silence, MD Inpatient   02/10/2017 1904 02/12/2017 1908 Full Code [id number]  Katha Cabal, MD ED   02/10/2017 1638 02/10/2017 1904 Full Code [id number]  Sela Hua, PA-C Outpatient   01/08/2017 1342 01/19/2017 2103 Full Code [id number]  Loletha Grayer, MD ED   12/29/2016 2232 12/30/2016 1634 Full Code [id number]  Lance Coon, MD ED   12/20/2016 1251 12/20/2016 1842 Full Code [id number]  Algernon Huxley, MD Inpatient   10/22/2016 0040 10/24/2016 2058 Full Code [id number]  Lance Coon, MD Inpatient   10/14/2016 1201 10/14/2016 1925 Full Code [id number]  Algernon Huxley, MD Inpatient    08/30/2016 1338 09/06/2016 1941 Full Code [id number]  Epifanio Lesches, MD Inpatient   05/19/2016 0454 05/22/2016 1907 Full Code [id number]  Saundra Shelling, MD Inpatient   Advance Care Planning Activity    Advance Directive Documentation     Most Recent Value  Type of Advance Directive  Healthcare Power of Attorney, Living will, Out of facility DNR (pink MOST or yellow form)  Pre-existing out of facility DNR order (yellow form or pink MOST form)  -  "MOST" Form in Place?  -        IV Access:    Peripheral IV   Procedures and diagnostic studies:   No results found.   Medical Consultants:    None.  Anti-Infectives:   none  Subjective:    [name] no complaints this morning.  Objective:    Vitals:   02/14/19 1932 02/14/19 1950 02/14/19 2156 02/15/19 0352  BP: (!) 113/52 (!) 146/52 (!) 156/60 (!) 127/58  Pulse: (!) 50 (!) 52 (!) 53 (!) 43  Resp:  18  16  Temp:  98.1 F (36.7 C)  97.7 F (36.5 C)  TempSrc:  Oral  Oral  SpO2: 100% 100%  100%  Weight:      Height:       SpO2: 100 % O2 Flow Rate (L/min): 2 L/min   Intake/Output Summary (Last 24 hours) at 02/15/2019 0823 Last data filed at 02/15/2019 0355 Gross per 24 hour  Intake 200 ml  Output 325 ml  Net -125 ml   Filed Weights   02/08/19 1548  Weight: 78.3 kg    Exam: General exam: In no acute distress. Respiratory system: Good air movement and clear to auscultation. Cardiovascular system: S1 & S2 heard, RRR. No JVD. Gastrointestinal system: Abdomen is nondistended, soft and nontender.  Central nervous system: Alert and oriented x 2. No focal neurological deficits. Extremities: Bilateral lower extremity BKA's, the left is healed the right looks slightly erythematous no drainage no foul-smelling. Skin: No rashes, lesions or ulcers  Data Reviewed:    Labs: Basic Metabolic Panel: Recent Labs  Lab 02/10/19 0150 02/11/19 0700 02/12/19 0350 02/13/19 0245 02/14/19 0400  NA 136  137 138 139 140  K 4.6 4.2 4.6 3.8 4.4  CL 104 102 103 103 103  CO2 24 27 26 26 26   GLUCOSE 299* 253* 227* 69* 176*  BUN 43* 41* 43* 35* 38*  CREATININE 1.08 1.13 1.03 0.97 1.11  CALCIUM 8.4* 8.5* 8.4* 8.3* 8.5*  MG 1.8 1.8 1.9 1.9 2.1   GFR Estimated Creatinine Clearance: 48.8 mL/min (by C-G formula based on SCr of 1.11 mg/dL). Liver Function Tests: Recent Labs  Lab 02/09/19 0258 02/10/19 0150 02/11/19 0700 02/12/19 0350 02/13/19 0245  AST 28 30 42* 36 45*  ALT 22 23 38 38 42  ALKPHOS 123 123 120 98 85  BILITOT 0.7 0.7 0.5 0.5 0.7  PROT 6.5 6.3* 6.4* 5.9* 6.1*  ALBUMIN 2.5* 2.6*  2.7* 2.4* 2.4*   No results for input(s): LIPASE, AMYLASE in the last 168 hours. No results for input(s): AMMONIA in the last 168 hours. Coagulation profile No results for input(s): INR, PROTIME in the last 168 hours. COVID-19 Labs  No results for input(s): DDIMER, FERRITIN, LDH, CRP in the last 72 hours.  Lab Results  Component Value Date   SARSCOV2NAA POSITIVE (A) 02/08/2019   [place] NEGATIVE 01/05/2019   [place] NEGATIVE 12/20/2018   [place] NEGATIVE 12/15/2018    CBC: Recent Labs  Lab 02/09/19 0258 02/10/19 0150 02/11/19 0700 02/12/19 0350 02/13/19 0245 02/14/19 0400  WBC 7.1 9.0 11.3* 14.0* 12.1* 10.0  NEUTROABS 6.7 8.4* 10.1* 12.1* 9.3*  --   HGB 9.0* 8.5* 9.9* 9.5* 10.1* 11.0*  HCT 29.5* 27.1* 31.4* 30.6* 32.1* 35.4*  MCV 89.7 88.9 88.2 88.7 88.2 89.4  PLT 355 375 398 378 372 408*   Cardiac Enzymes: No results for input(s): CKTOTAL, CKMB, CKMBINDEX, TROPONINI in the last 168 hours. BNP (last 3 results) No results for input(s): PROBNP in the last 8760 hours. CBG: Recent Labs  Lab 02/14/19 0819 02/14/19 1148 02/14/19 1535 02/14/19 2042 02/15/19 0730  GLUCAP 172* 193* 149* 290* 128*   D-Dimer: No results for input(s): DDIMER in the last 72 hours. Hgb A1c: No results for input(s): HGBA1C in the last 72 hours. Lipid Profile: No results for input(s):  CHOL, HDL, LDLCALC, TRIG, CHOLHDL, LDLDIRECT in the last 72 hours. Thyroid function studies: No results for input(s): TSH, T4TOTAL, T3FREE, THYROIDAB in the last 72 hours.  Invalid input(s): FREET3 Anemia work up: No results for input(s): VITAMINB12, FOLATE, FERRITIN, TIBC, IRON, RETICCTPCT in the last 72 hours. Sepsis Labs: Recent Labs  Lab 02/08/19 0927 02/08/19 1707  02/11/19 0700 02/12/19 0350 02/13/19 0245 02/14/19 0400  PROCALCITON  --  0.54  --   --   --   --   --   WBC  --   --    < > 11.3* 14.0* 12.1* 10.0  LATICACIDVEN 2.3*  --   --   --   --   --   --    < > = values in this interval not displayed.   Microbiology Recent Results (from the past 240 hour(s))  Blood Culture (routine x 2)     Status: None   Collection Time: 02/08/19  5:57 AM   Specimen: BLOOD  Result Value Ref Range Status   Specimen Description BLOOD BLOOD LEFT HAND  Final   Special Requests   Final    BOTTLES DRAWN AEROBIC AND ANAEROBIC Blood Culture adequate volume   Culture   Final    NO GROWTH 5 DAYS Performed at [facility], [address], [place], [place] [address]    Report Status 02/13/2019 FINAL  Final  Blood Culture (routine x 2)     Status: None   Collection Time: 02/08/19  5:57 AM   Specimen: BLOOD  Result Value Ref Range Status   Specimen Description BLOOD RIGHT WRIST  Final   Special Requests   Final    BOTTLES DRAWN AEROBIC AND ANAEROBIC Blood Culture results may not be optimal due to an excessive volume of blood received in culture bottles   Culture   Final    NO GROWTH 5 DAYS Performed at [facility], [address], [place], [place] [address]    Report Status 02/13/2019 FINAL  Final  SARS Coronavirus 2 [facility] order, Performed in [facility] hospital lab) Nasopharyngeal Nasopharyngeal Swab  Status: Abnormal   Collection Time: 02/08/19  6:01 AM   Specimen: Nasopharyngeal Swab  Result Value Ref Range Status   SARS Coronavirus 2 POSITIVE (A) NEGATIVE  Final    Comment: RESULT CALLED TO, READ BACK BY AND VERIFIED WITH:  LAURA CATES AT 0735 02/08/2019 SDR (NOTE) If result is NEGATIVE SARS-CoV-2 target nucleic acids are NOT DETECTED. The SARS-CoV-2 RNA is generally detectable in upper and lower  respiratory specimens during the acute phase of infection. The lowest  concentration of SARS-CoV-2 viral copies this assay can detect is 250  copies / mL. A negative result does not preclude SARS-CoV-2 infection  and should not be used as the sole basis for treatment or other  patient management decisions.  A negative result may occur with  improper specimen collection / handling, submission of specimen other  than nasopharyngeal swab, presence of viral mutation(s) within the  areas targeted by this assay, and inadequate number of viral copies  (<250 copies / mL). A negative result must be combined with clinical  observations, patient history, and epidemiological information. If result is POSITIVE SARS-CoV-2 target nucleic acids are DETECTED. Th e SARS-CoV-2 RNA is generally detectable in upper and lower  respiratory specimens during the acute phase of infection.  Positive  results are indicative of active infection with SARS-CoV-2.  Clinical  correlation with patient history and other diagnostic information is  necessary to determine patient infection status.  Positive results do  not rule out bacterial infection or co-infection with other viruses. If result is PRESUMPTIVE POSTIVE SARS-CoV-2 nucleic acids MAY BE PRESENT.   A presumptive positive result was obtained on the submitted specimen  and confirmed on repeat testing.  While 2019 novel coronavirus  (SARS-CoV-2) nucleic acids may be present in the submitted sample  additional confirmatory testing may be necessary for epidemiological  and / or clinical management purposes  to differentiate between  SARS-CoV-2 and other Sarbecovirus currently known to infect humans.  If clinically  indicated additional testing with an alternate test  methodology [phone]) is  advised. The SARS-CoV-2 RNA is generally  detectable in upper and lower respiratory specimens during the acute  phase of infection. The expected result is Negative. Fact Sheet for Patients:  StrictlyIdeas.no Fact Sheet for Healthcare Providers: BankingDealers.co.za This test is not yet approved or cleared by the [place] FDA and has been authorized for detection and/or diagnosis of SARS-CoV-2 by FDA under an Emergency Use Authorization (EUA).  This EUA will remain in effect (meaning this test can be used) for the duration of the COVID-19 declaration under Section 564(b)(1) of the Act, 21 U.S.C. section 360bbb-3(b)(1), unless the authorization is terminated or revoked sooner. Performed at [facility], [address] [place], [place] [address]   MRSA PCR Screening     Status: None   Collection Time: 02/08/19  6:45 PM   Specimen: Nasal Mucosa; Nasopharyngeal  Result Value Ref Range Status   MRSA by PCR NEGATIVE NEGATIVE Final    Comment:        The GeneXpert MRSA Assay (FDA approved for NASAL specimens only), is one component of a comprehensive MRSA colonization surveillance program. It is not intended to diagnose MRSA infection nor to guide or monitor treatment for MRSA infections. Performed at [facility], [place] [address], [place], [place] [address]      Medications:   . amiodarone  200 mg Oral Daily  . aspirin EC  81 mg Oral Daily  . atorvastatin  80 mg  Oral QPM  . collagenase   Topical Daily  . dextrose  25 mL Intravenous Once  . diltiazem  120 mg Oral Daily  . divalproex  250 mg Oral Daily  . famotidine  20 mg Oral Daily  . feeding supplement (ENSURE ENLIVE)  237 mL Oral TID BM  . ferrous sulfate  325 mg Oral Q breakfast  . insulin aspart  0-20 Units Subcutaneous TID WC  . insulin aspart  0-5 Units  Subcutaneous QHS  . insulin glargine  18 Units Subcutaneous Daily  . isosorbide mononitrate  60 mg Oral Daily  . mouth rinse  15 mL Mouth Rinse BID  . metoprolol tartrate  25 mg Oral TID  . multivitamin with minerals  1 tablet Oral Daily  . polyethylene glycol  17 g Oral BID  . Rivaroxaban  15 mg Oral Q supper  . senna  2 tablet Oral QHS  . silver sulfADIAZINE  1 application Topical BID  . sodium chloride flush  3 mL Intravenous Q12H  . sodium chloride flush  3 mL Intravenous Q12H  . ascorbic acid  500 mg Oral Daily  . zinc sulfate  220 mg Oral Daily   Continuous Infusions: . sodium chloride       LOS: 7 days   Charlynne Cousins  [facility]  02/15/2019, 8:23 AM

## 2019-02-15 NOTE — Progress Notes (Signed)
Tech was d/c'd at 7 am. With scheduled and PRN medication patient has remained cooperative and has not attempted to get out of bed.

## 2019-02-16 ENCOUNTER — Ambulatory Visit: Payer: Medicare Other | Admitting: Family

## 2019-02-16 DIAGNOSIS — D500 Iron deficiency anemia secondary to blood loss (chronic): Secondary | ICD-10-CM

## 2019-02-16 DIAGNOSIS — I5021 Acute systolic (congestive) heart failure: Secondary | ICD-10-CM

## 2019-02-16 DIAGNOSIS — L8942 Pressure ulcer of contiguous site of back, buttock and hip, stage 2: Secondary | ICD-10-CM

## 2019-02-16 DIAGNOSIS — E43 Unspecified severe protein-calorie malnutrition: Secondary | ICD-10-CM

## 2019-02-16 LAB — GLUCOSE, CAPILLARY
Glucose-Capillary: 54 mg/dL — ABNORMAL LOW (ref 70–99)
Glucose-Capillary: 99 mg/dL (ref 70–99)
Glucose-Capillary: 203 mg/dL — ABNORMAL HIGH (ref 70–99)
Glucose-Capillary: 219 mg/dL — ABNORMAL HIGH (ref 70–99)

## 2019-02-16 MED ORDER — DIVALPROEX SODIUM ER 500 MG PO TB24: 500.0000 mg | ORAL_TABLET | Freq: Every day | ORAL | Status: DC

## 2019-02-16 MED ORDER — ISOSORBIDE MONONITRATE ER 60 MG PO TB24: 60.0000 mg | ORAL_TABLET | Freq: Every day | ORAL | 0 refills | Status: DC

## 2019-02-16 NOTE — TOC Progression Note (Signed)
Contacted Kelly at [facility] to determine how many insurance days of rehab were used there before admission to [facility]. Per Claiborne Billings, Pt was at [facility] from 01/05/19-02/07/19.

## 2019-02-16 NOTE — TOC Progression Note (Signed)
Transition of Care [facility]) - Progression Note    Patient Details  Name: [name] MRN: [id number] Date of Birth: July 11, 1934  Transition of [facility]) CM/SW Contact  Loletha Grayer Beverely Pace, RN Phone Number:[phone] (working remotely) 02/16/2019, 8:59 AM  Clinical Narrative:   Case manager spoke with patient's son [name] via telephone, concerning bed offers. [name] states they will accept bed at [facility]. Unfortunately, both he and his mom, patient's wife, have tested positive for COVID and are quarantined. Case manager contacted Irine Seal at [facility] requesting that she request insurance authorization for patient.    Expected Discharge Plan: Skilled Nursing Facility Barriers to Discharge: Continued Medical Work up  Expected Discharge Plan and Services Expected Discharge Plan: [place]   Discharge Planning Services: CM Consult   Living arrangements for the past 2 months: [place], [place] Expected Discharge Date: 02/16/19               DME Arranged: N/A DME Agency: NA       HH Arranged: NA HH Agency: NA         Social Determinants of Health (SDOH) Interventions    Readmission Risk Interventions Readmission Risk Prevention Plan 02/12/2019 12/25/2018 12/23/2018  Transportation Screening - Complete Complete  PCP or Specialist Appt within 3-5 Days - Complete -  HRI or [place] - Complete Complete  Social Work Consult for [place] Planning/Counseling - Complete -  Palliative Care Screening - Not Applicable -  Medication Review Press photographer) - Complete Complete  PCP or Specialist appointment within 3-5 days of discharge Not Complete - -  PCP/Specialist Appt Not Complete comments not ready for dc, family requests SNF placement - -  [place] or Home Care Consult Complete - -  SW Recovery Care/Counseling Consult Complete - -  Palliative Care Screening Not Applicable - -  Skilled Nursing Facility Complete - -  Some  recent data might be hidden

## 2019-02-16 NOTE — Evaluation (Signed)
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 He had a Left Transtibial Amputation on 01/12/2017.  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 Pt currently with functional limitations due to the deficits listed below (see PT Problem List). Pt slightly confused at this time and needing increased assistance with all functional mobility. Pt will benefit from skilled PT to increase his strength, functional independence, activity tolerance and safety with mobility to allow discharge to the venue listed below.        Follow Up Recommendations SNF    Equipment Recommendations  None recommended by PT    Recommendations for Other Services       Precautions / Restrictions Precautions Precautions: Fall;Other (comment)(R BKA) Required Braces or Orthoses: (has prosthesis  but not on him) Restrictions Weight Bearing Restrictions: No      Mobility  Bed Mobility Overal bed mobility: Needs Assistance Bed Mobility: Supine to Sit     Supine to sit: Max assist     General bed mobility comments: needs assist with task initiatiation  Transfers Overall transfer level: Needs assistance Equipment used: (used bed pad to assist with transfer to chair) Transfers: Anterior-Posterior Transfer(posterior scoot to chair)       Anterior-Posterior transfers: +2 physical assistance   General transfer comment: gives some effort but still needing +2 to complete transfer  Ambulation/Gait                Stairs            Wheelchair Mobility    Modified Rankin (Stroke Patients Only)       Balance Overall balance assessment: Needs  assistance Sitting-balance support: Bilateral upper extremity supported Sitting balance-Leahy Scale: Poor Sitting balance - Comments: does better with increased time but still needs some support to maintain sitting w/o back supported on bed Postural control: Posterior lean Standing balance support: (did not attempt at this time as does not have prosthesis)                                 Pertinent Vitals/Pain Pain Assessment: Faces Faces Pain Scale: Hurts whole lot Pain Location: R stump, (states has just had stump wrapped hence pain) Pain Intervention(s): Limited activity within patient's tolerance;Monitored during session    Home Living Family/patient expects to be discharged to:: Skilled nursing facility Living Arrangements: Spouse/significant other Available Help at Discharge: Family Type of Home: House Home Access: Ramped entrance     Home Layout: One level Home Equipment: Cane - single point;Walker - 2 wheels;Grab bars - toilet;Wheelchair - Sport and exercise psychologist Comments: was living home with spouse, but at this time spouse is also ill    Prior Function Level of Independence: Independent with assistive device(s)               Hand Dominance        Extremity/Trunk Assessment   Upper Extremity Assessment Upper Extremity Assessment: Defer to OT evaluation    Lower Extremity Assessment Lower Extremity Assessment: Generalized weakness(B BKAs)       Communication   Communication: HOH(perceverates on topics needing redirection )  Cognition Arousal/Alertness: Awake/alert  Behavior During Therapy: Anxious Overall Cognitive Status: Impaired/Different from baseline Area of Impairment: Attention;Following commands;Safety/judgement;Problem solving                   Current Attention Level: Alternating   Following Commands: Follows one step commands inconsistently Safety/Judgement: Decreased awareness of safety;Decreased awareness of  deficits   Problem Solving: Slow processing;Decreased initiation;Difficulty sequencing;Requires verbal cues        General Comments General comments (skin integrity, edema, etc.): overall pt seems to be slightly confused, able to give some hx answers but confuses timing and where he was etc. Pt perceverates on some points needing redirection to task to progress. He presents with increased weakness, decreased balance and coordination also independence.    Exercises     Assessment/Plan    PT Assessment Patient needs continued PT services  PT Problem List Decreased strength;Decreased activity tolerance;Decreased balance;Decreased coordination;Decreased cognition;Decreased safety awareness       PT Treatment Interventions Functional mobility training;Therapeutic activities;Therapeutic exercise;Balance training;Neuromuscular re-education;Patient/family education    PT Goals (Current goals can be found in the Care Plan section)  Acute Rehab PT Goals PT Goal Formulation: With patient/family Time For Goal Achievement: 03/02/19 Potential to Achieve Goals: Fair    Frequency Min 2X/week   Barriers to discharge        Co-evaluation PT/OT/SLP Co-Evaluation/Treatment: Yes Reason for Co-Treatment: Complexity of the patient's impairments (multi-system involvement) PT goals addressed during session: Mobility/safety with mobility         AM-PAC PT "6 Clicks" Mobility  Outcome Measure Help needed turning from your back to your side while in a flat bed without using bedrails?: A Lot Help needed moving from lying on your back to sitting on the side of a flat bed without using bedrails?: A Lot Help needed moving to and from a bed to a chair (including a wheelchair)?: Total Help needed standing up from a chair using your arms (e.g., wheelchair or bedside chair)?: Total Help needed to walk in hospital room?: Total Help needed climbing 3-5 steps with a railing? : Total 6 Click Score: 8     End of Session   Activity Tolerance: Patient limited by lethargy Patient left: in bed;with call bell/phone within reach;with nursing/sitter in room Nurse Communication: Mobility status;Precautions PT Visit Diagnosis: Muscle weakness (generalized) (M62.81);Other abnormalities of gait and mobility (R26.89)    Time: [id number] PT Time Calculation (min) (ACUTE ONLY): 20 min   Charges:   PT Evaluation $PT Eval Moderate Complexity: [place], PT   Delford Field 02/16/2019, 4:48 PM

## 2019-02-16 NOTE — Care Management (Signed)
Case manager has reached out to [place] per son's request. They will begin insurance authorization once they are informed if patient's family will pay the required 10 days co-pay. $178.50/day= $1785.00.   Case manager contacted patient's son [name] and explained this. [name] said he will discuss with his mom and get back to me.[name] is also concerned that his mom is home ill with COVID as well as himself. Also questioned possibility of his dad coming home with Home Health.  CM provided him with the number to reach [place] and request business office. Will continue to follow.

## 2019-02-16 NOTE — Discharge Summary (Signed)
Physician Discharge Summary  [name] [id number] DOB: 1934/10/17 DOA: 02/08/2019  PCP: Corey Skains, MD  Admit date: 02/08/2019 Discharge date: 02/16/2019  Admitted From: SNF Disposition:  SNF  Recommendations for Outpatient Follow-up:  1. Follow up with Ortho in 1-2 weeks for his follow-up appointment he missed. 2. Please obtain BMP/CBC in one week. 3. Nutrition to reevaluate at facility.   Home Health:No Equipment/Devices:None  Discharge Condition:Stable CODE STATUS: Full Diet recommendation: Heart Healthy  Brief/Interim Summary: 84 y.o. 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 He had no further events he was continue his home dose of Xarelto.  Acute metabolic encephalopathy/acute confusional state: Multifactorial in the setting of acute illness, steroids were imposed on his baseline dementia. He was placed on several antipsychotic agents and needs QTC started to become prolonged. Several agents were tried unsuccessfully due to his prolonged QTC. He was placed on Depakote which helped with his confusional state.  His sitter has been DC'd and he is to remain stable and comparative.  Stable angina in the setting of known coronary artery disease: Patient had anginal chest pain on 02/09/2019 relieved by nitroglycerin it was discussed with cardiology as the patient had no EKG changes echo recommended conservative management. He was continue on beta-blocker, Imdur aspirin and Xarelto. He was also continue on Lipitor.  Recent right BKA stump: Followed by vascular surgery at [facility] last seen in his office on 02/09/2019 during this time he was placed on oral Bactrim for which she has completed treatment he has remained afebrile with no leukocytosis. Wound care was consulted recommended dressing changes with Santyl,Staples were removed.  Mild acute kidney injury on chronic kidney disease stage III: Likely prerenal azotemia resolved with IV fluid hydration.  Chronic systolic heart failure: With a 2D echo showed an EF of 45%, on admission his BNP was elevated he appeared to be fluid overloaded he was diuresed. He will continue his current home with diuretics.  Essential hypertension: No changes made to his regimen.  History of paroxysmal atrial fibrillation: Continue diltiazem metoprolol and amiodarone currently rate controlled. Continue Xarelto.  Diabetes mellitus type 2 uncontrolled  with hyperglycemia: With an A1c of 8.9, will continue  long-acting insulin plus sliding scale.  His long-acting insulin has been increased along with his meal coverage.  Hyperlipidemia: Continue statins.  Normocytic anemia: He was hemoconcentrated on admission his hemoglobin had a mild drop in his hemoglobin has remained relatively stable.  History of obstructive sleep apnea: Continue CPAP at night at home.  Pressure ulcer present on admission located in the perineum, bilateral buttock: Wound care was consulted.  Severe protein caloric malnutrition: Continue ensure 4 times daily.    Discharge Instructions  Discharge Instructions    Diet - low sodium heart healthy   Complete by: As directed    Increase activity slowly   Complete by: As directed    MyChart COVID-19 home monitoring program   Complete by: Feb 16, 2019    Is the patient willing to use the [place] for home monitoring?: No     Allergies as of 02/16/2019      Reactions   Contrast Media [iodinated Diagnostic Agents] Shortness Of Breath   SOB   Iohexol Shortness Of Breath    Desc: Respiratory Distress, Laryngedema, diaphoresis, Onset Date: [id number]   Metrizamide Shortness Of Breath   SOB SOB SOB   Latex Rash      Medication List    TAKE these medications   acetaminophen 325 MG tablet Commonly known as: TYLENOL Take 650 mg by mouth every 6 (six) hours as needed.   amiodarone 200 MG tablet Commonly known as: PACERONE Take 200 mg by mouth daily.   ascorbic acid 250 MG tablet Commonly known as: VITAMIN C Take 1 tablet (250 mg total) by mouth 2 (two) times daily.   aspirin EC 81 MG tablet Take 1 tablet (81 mg total) by mouth daily.   atorvastatin 80 MG tablet Commonly known as: LIPITOR Take 80 mg by mouth every evening.   bisacodyl 5 MG EC tablet Commonly known as: DULCOLAX Take 1 tablet (5 mg total) by mouth daily as needed for moderate constipation.   collagenase ointment Commonly known as: SANTYL Apply 1 application topically daily.    diltiazem 120 MG 24 hr capsule Commonly known as: CARDIZEM CD Take 120 mg by mouth daily.   divalproex 500 MG 24 hr tablet Commonly known as: DEPAKOTE ER Take 1 tablet (500 mg total) by mouth daily.   Ensure Max Protein Liqd Take 330 mLs (11 oz total) by mouth 2 (two) times daily.   ferrous sulfate 325 (65 FE) MG tablet Take 1 tablet (325 mg total) by mouth daily with breakfast.   HYDROcodone-acetaminophen 5-325 MG tablet Commonly known as: NORCO/VICODIN Take 1-2 tablets by mouth every 6 (six) hours as needed for moderate pain (pain score 4-6).   insulin glargine 100 UNIT/ML injection Commonly known as: LANTUS Inject 0.15 mLs (15 Units total) into the skin at bedtime.   insulin lispro 100 UNIT/ML injection Commonly known as: HumaLOG Inject 0.03 mLs (3 Units total) into the skin 3 (three) times daily with meals. What changed: additional instructions   isosorbide mononitrate 60 MG 24 hr tablet Commonly known as: IMDUR Take 1 tablet (60 mg total) by mouth daily.   metoprolol tartrate 25 MG tablet Commonly known as: LOPRESSOR Take 1 tablet (25 mg total) by mouth 3 (three) times daily.   multivitamin with minerals Tabs tablet Take 1 tablet by mouth daily.   nitroGLYCERIN 0.4 MG SL tablet Commonly known as: NITROSTAT Place 1 tablet (0.4 mg total) under the tongue every 5 (  five) minutes as needed for chest pain.   Rivaroxaban 15 MG Tabs tablet Commonly known as: XARELTO Take 1 tablet (15 mg total) by mouth daily with supper. Hold Xarelto for 3 days.  Start on January 09, 2019.   senna 8.6 MG Tabs tablet Commonly known as: SENOKOT Take 1 tablet (8.6 mg total) by mouth daily as needed for mild constipation.   silver sulfADIAZINE 1 % cream Commonly known as: SILVADENE Apply 1 application topically daily.   sulfamethoxazole-trimethoprim 400-80 MG tablet Commonly known as: BACTRIM Take 1 tablet by mouth 2 (two) times daily.       Allergies  Allergen Reactions  .  Contrast Media [Iodinated Diagnostic Agents] Shortness Of Breath    SOB  . Iohexol Shortness Of Breath     Desc: Respiratory Distress, Laryngedema, diaphoresis, Onset Date: [id number]   . Metrizamide Shortness Of Breath    SOB SOB SOB   . Latex Rash    Consultations:  Cardiology   Procedures/Studies: Dg Chest Port 1 View  Result Date: 02/08/2019 CLINICAL DATA:  Respiratory distress EXAM: PORTABLE CHEST 1 VIEW COMPARISON:  12/21/2018 FINDINGS: Chronic cardiomegaly and interstitial prominence with mild scarring at the right base. New asymmetric hazy airspace type density on the right. No effusion or pneumothorax. Stable borderline heart size. Artifact from overlapping hardware. IMPRESSION: Hazy asymmetric right-sided opacity primarily suspicious for pneumonia. Background chronic bronchitic markings. Electronically Signed   By: Monte Fantasia M.D.   On: 02/08/2019 06:27      Subjective: Patient resting comfortably in bed with no new complaints.  Quite today.  Discharge Exam: Vitals:   02/16/19 0330 02/16/19 0812  BP: (!) 154/45   Pulse: (!) 51   Resp: 18 18  Temp: 98.2 F (36.8 C) 98.4 F (36.9 C)  SpO2: 94%    Vitals:   02/15/19 1640 02/15/19 2018 02/16/19 0330 02/16/19 0812  BP: (!) 127/58 (!) 147/71 (!) 154/45   Pulse:  (!) 51 (!) 51   Resp: 18 18 18 18   Temp: 97.8 F (36.6 C) 97.6 F (36.4 C) 98.2 F (36.8 C) 98.4 F (36.9 C)  TempSrc: Oral   Oral  SpO2: 95% 100% 94%   Weight:      Height:        General:  not in acute distress Cardiovascular: RRR, S1/S2 +, no rubs, no gallops Respiratory: CTA bilaterally, no wheezing, no rhonchi Abdominal: Soft, NT, ND, bowel sounds + Extremities: no edema, no cyanosis    The results of significant diagnostics from this hospitalization (including imaging, microbiology, ancillary and laboratory) are listed below for reference.     Microbiology: Recent Results (from the past 240 hour(s))  Blood Culture (routine x 2)      Status: None   Collection Time: 02/08/19  5:57 AM   Specimen: BLOOD  Result Value Ref Range Status   Specimen Description BLOOD BLOOD LEFT HAND  Final   Special Requests   Final    BOTTLES DRAWN AEROBIC AND ANAEROBIC Blood Culture adequate volume   Culture   Final    NO GROWTH 5 DAYS Performed at [facility], [address] [place], [place] [address]    Report Status 02/13/2019 FINAL  Final  Blood Culture (routine x 2)     Status: None   Collection Time: 02/08/19  5:57 AM   Specimen: BLOOD  Result Value Ref Range Status   Specimen Description BLOOD RIGHT WRIST  Final   Special Requests   Final  BOTTLES DRAWN AEROBIC AND ANAEROBIC Blood Culture results may not be optimal due to an excessive volume of blood received in culture bottles   Culture   Final    NO GROWTH 5 DAYS Performed at [facility], [address] [place], [place] [address]    Report Status 02/13/2019 FINAL  Final  SARS Coronavirus 2 [facility] order, Performed in [facility] hospital lab) Nasopharyngeal Nasopharyngeal Swab     Status: Abnormal   Collection Time: 02/08/19  6:01 AM   Specimen: Nasopharyngeal Swab  Result Value Ref Range Status   SARS Coronavirus 2 POSITIVE (A) NEGATIVE Final    Comment: RESULT CALLED TO, READ BACK BY AND VERIFIED WITH:  LAURA CATES AT 0735 02/08/2019 SDR (NOTE) If result is NEGATIVE SARS-CoV-2 target nucleic acids are NOT DETECTED. The SARS-CoV-2 RNA is generally detectable in upper and lower  respiratory specimens during the acute phase of infection. The lowest  concentration of SARS-CoV-2 viral copies this assay can detect is 250  copies / mL. A negative result does not preclude SARS-CoV-2 infection  and should not be used as the sole basis for treatment or other  patient management decisions.  A negative result may occur with  improper specimen collection / handling, submission of specimen other  than nasopharyngeal swab, presence of viral  mutation(s) within the  areas targeted by this assay, and inadequate number of viral copies  (<250 copies / mL). A negative result must be combined with clinical  observations, patient history, and epidemiological information. If result is POSITIVE SARS-CoV-2 target nucleic acids are DETECTED. Th e SARS-CoV-2 RNA is generally detectable in upper and lower  respiratory specimens during the acute phase of infection.  Positive  results are indicative of active infection with SARS-CoV-2.  Clinical  correlation with patient history and other diagnostic information is  necessary to determine patient infection status.  Positive results do  not rule out bacterial infection or co-infection with other viruses. If result is PRESUMPTIVE POSTIVE SARS-CoV-2 nucleic acids MAY BE PRESENT.   A presumptive positive result was obtained on the submitted specimen  and confirmed on repeat testing.  While 2019 novel coronavirus  (SARS-CoV-2) nucleic acids may be present in the submitted sample  additional confirmatory testing may be necessary for epidemiological  and / or clinical management purposes  to differentiate between  SARS-CoV-2 and other Sarbecovirus currently known to infect humans.  If clinically indicated additional testing with an alternate test  methodology [phone]) is  advised. The SARS-CoV-2 RNA is generally  detectable in upper and lower respiratory specimens during the acute  phase of infection. The expected result is Negative. Fact Sheet for Patients:  StrictlyIdeas.no Fact Sheet for Healthcare Providers: BankingDealers.co.za This test is not yet approved or cleared by the [place] FDA and has been authorized for detection and/or diagnosis of SARS-CoV-2 by FDA under an Emergency Use Authorization (EUA).  This EUA will remain in effect (meaning this test can be used) for the duration of the COVID-19 declaration under Section 564(b)(1)  of the Act, 21 U.S.C. section 360bbb-3(b)(1), unless the authorization is terminated or revoked sooner. Performed at [facility], [address] [place], [place] [address]   MRSA PCR Screening     Status: None   Collection Time: 02/08/19  6:45 PM   Specimen: Nasal Mucosa; Nasopharyngeal  Result Value Ref Range Status   MRSA by PCR NEGATIVE NEGATIVE Final    Comment:        The GeneXpert MRSA Assay (  FDA approved for NASAL specimens only), is one component of a comprehensive MRSA colonization surveillance program. It is not intended to diagnose MRSA infection nor to guide or monitor treatment for MRSA infections. Performed at [facility], [place] [address], [place], [place] [address]      Labs: BNP (last 3 results) Recent Labs    12/20/18 0602 12/21/18 0054 02/08/19 0556  BNP 1,436.0* 1,436.0* [id number]*   Basic Metabolic Panel: Recent Labs  Lab 02/10/19 0150 02/11/19 0700 02/12/19 0350 02/13/19 0245 02/14/19 0400  NA 136 137 138 139 140  K 4.6 4.2 4.6 3.8 4.4  CL 104 102 103 103 103  CO2 24 27 26 26 26   GLUCOSE 299* 253* 227* 69* 176*  BUN 43* 41* 43* 35* 38*  CREATININE 1.08 1.13 1.03 0.97 1.11  CALCIUM 8.4* 8.5* 8.4* 8.3* 8.5*  MG 1.8 1.8 1.9 1.9 2.1   Liver Function Tests: Recent Labs  Lab 02/10/19 0150 02/11/19 0700 02/12/19 0350 02/13/19 0245  AST 30 42* 36 45*  ALT 23 38 38 42  ALKPHOS 123 120 98 85  BILITOT 0.7 0.5 0.5 0.7  PROT 6.3* 6.4* 5.9* 6.1*  ALBUMIN 2.6* 2.7* 2.4* 2.4*   No results for input(s): LIPASE, AMYLASE in the last 168 hours. No results for input(s): AMMONIA in the last 168 hours. CBC: Recent Labs  Lab 02/10/19 0150 02/11/19 0700 02/12/19 0350 02/13/19 0245 02/14/19 0400  WBC 9.0 11.3* 14.0* 12.1* 10.0  NEUTROABS 8.4* 10.1* 12.1* 9.3*  --   HGB 8.5* 9.9* 9.5* 10.1* 11.0*  HCT 27.1* 31.4* 30.6* 32.1* 35.4*  MCV 88.9 88.2 88.7 88.2 89.4  PLT 375 398 378 372 408*   Cardiac Enzymes: No  results for input(s): CKTOTAL, CKMB, CKMBINDEX, TROPONINI in the last 168 hours. BNP: Invalid input(s): POCBNP CBG: Recent Labs  Lab 02/15/19 1131 02/15/19 1639 02/15/19 1711 02/15/19 1943 02/16/19 0752  GLUCAP 131* 54* 80 102* 99   D-Dimer No results for input(s): DDIMER in the last 72 hours. Hgb A1c No results for input(s): HGBA1C in the last 72 hours. Lipid Profile No results for input(s): CHOL, HDL, LDLCALC, TRIG, CHOLHDL, LDLDIRECT in the last 72 hours. Thyroid function studies No results for input(s): TSH, T4TOTAL, T3FREE, THYROIDAB in the last 72 hours.  Invalid input(s): FREET3 Anemia work up No results for input(s): VITAMINB12, FOLATE, FERRITIN, TIBC, IRON, RETICCTPCT in the last 72 hours. Urinalysis    Component Value Date/Time   COLORURINE YELLOW (A) 10/13/2018 1205   APPEARANCEUR CLEAR (A) 10/13/2018 1205   LABSPEC 1.016 10/13/2018 1205   PHURINE 6.0 10/13/2018 1205   GLUCOSEU >=500 (A) 10/13/2018 1205   HGBUR NEGATIVE 10/13/2018 1205   BILIRUBINUR NEGATIVE 10/13/2018 1205   KETONESUR NEGATIVE 10/13/2018 1205   PROTEINUR NEGATIVE 10/13/2018 1205   NITRITE NEGATIVE 10/13/2018 1205   LEUKOCYTESUR NEGATIVE 10/13/2018 1205   Sepsis Labs Invalid input(s): PROCALCITONIN,  WBC,  LACTICIDVEN Microbiology Recent Results (from the past 240 hour(s))  Blood Culture (routine x 2)     Status: None   Collection Time: 02/08/19  5:57 AM   Specimen: BLOOD  Result Value Ref Range Status   Specimen Description BLOOD BLOOD LEFT HAND  Final   Special Requests   Final    BOTTLES DRAWN AEROBIC AND ANAEROBIC Blood Culture adequate volume   Culture   Final    NO GROWTH 5 DAYS Performed at [facility], [address], [place], [place] [address]    Report Status 02/13/2019 FINAL  Final  Blood Culture (routine x 2)     Status: None   Collection Time: 02/08/19  5:57 AM   Specimen: BLOOD  Result Value Ref Range Status   Specimen Description BLOOD RIGHT WRIST  Final    Special Requests   Final    BOTTLES DRAWN AEROBIC AND ANAEROBIC Blood Culture results may not be optimal due to an excessive volume of blood received in culture bottles   Culture   Final    NO GROWTH 5 DAYS Performed at [facility], [address] [place], [place] [address]    Report Status 02/13/2019 FINAL  Final  SARS Coronavirus 2 [facility] order, Performed in [facility] hospital lab) Nasopharyngeal Nasopharyngeal Swab     Status: Abnormal   Collection Time: 02/08/19  6:01 AM   Specimen: Nasopharyngeal Swab  Result Value Ref Range Status   SARS Coronavirus 2 POSITIVE (A) NEGATIVE Final    Comment: RESULT CALLED TO, READ BACK BY AND VERIFIED WITH:  LAURA CATES AT 0735 02/08/2019 SDR (NOTE) If result is NEGATIVE SARS-CoV-2 target nucleic acids are NOT DETECTED. The SARS-CoV-2 RNA is generally detectable in upper and lower  respiratory specimens during the acute phase of infection. The lowest  concentration of SARS-CoV-2 viral copies this assay can detect is 250  copies / mL. A negative result does not preclude SARS-CoV-2 infection  and should not be used as the sole basis for treatment or other  patient management decisions.  A negative result may occur with  improper specimen collection / handling, submission of specimen other  than nasopharyngeal swab, presence of viral mutation(s) within the  areas targeted by this assay, and inadequate number of viral copies  (<250 copies / mL). A negative result must be combined with clinical  observations, patient history, and epidemiological information. If result is POSITIVE SARS-CoV-2 target nucleic acids are DETECTED. Th e SARS-CoV-2 RNA is generally detectable in upper and lower  respiratory specimens during the acute phase of infection.  Positive  results are indicative of active infection with SARS-CoV-2.  Clinical  correlation with patient history and other diagnostic information is  necessary to determine patient  infection status.  Positive results do  not rule out bacterial infection or co-infection with other viruses. If result is PRESUMPTIVE POSTIVE SARS-CoV-2 nucleic acids MAY BE PRESENT.   A presumptive positive result was obtained on the submitted specimen  and confirmed on repeat testing.  While 2019 novel coronavirus  (SARS-CoV-2) nucleic acids may be present in the submitted sample  additional confirmatory testing may be necessary for epidemiological  and / or clinical management purposes  to differentiate between  SARS-CoV-2 and other Sarbecovirus currently known to infect humans.  If clinically indicated additional testing with an alternate test  methodology [phone]) is  advised. The SARS-CoV-2 RNA is generally  detectable in upper and lower respiratory specimens during the acute  phase of infection. The expected result is Negative. Fact Sheet for Patients:  StrictlyIdeas.no Fact Sheet for Healthcare Providers: BankingDealers.co.za This test is not yet approved or cleared by the [place] FDA and has been authorized for detection and/or diagnosis of SARS-CoV-2 by FDA under an Emergency Use Authorization (EUA).  This EUA will remain in effect (meaning this test can be used) for the duration of the COVID-19 declaration under Section 564(b)(1) of the Act, 21 U.S.C. section 360bbb-3(b)(1), unless the authorization is terminated or revoked sooner. Performed at [facility], [address] [place], [place] [address]   MRSA PCR  Screening     Status: None   Collection Time: 02/08/19  6:45 PM   Specimen: Nasal Mucosa; Nasopharyngeal  Result Value Ref Range Status   MRSA by PCR NEGATIVE NEGATIVE Final    Comment:        The GeneXpert MRSA Assay (FDA approved for NASAL specimens only), is one component of a comprehensive MRSA colonization surveillance program. It is not intended to diagnose MRSA infection nor to guide  or monitor treatment for MRSA infections. Performed at [facility], [place] [address] [place], [place] [address]      Time coordinating discharge: Over 40 minutes  SIGNED:   Charlynne Cousins, MD  [facility] 02/16/2019, 8:16 AM Pager   If 7PM-7AM, please contact night-coverage [email or web address] Password [email or web address]

## 2019-02-16 NOTE — Progress Notes (Signed)
Patient's son, [name], updated on patient's status and plan of care.  All questions welcomed and answered.

## 2019-02-16 NOTE — Care Management Important Message (Signed)
Important Message  Patient Details  Name: [name] MRN: [id number] Date of Birth: 1934/10/05   Medicare Important Message Given:  Yes - Important Message mailed due to current National Emergency  Verbal consent obtained due to current National Emergency  Relationship to patient: Child Contact Name: [name] Call Date: 02/16/19  Time: [id number] Phone: [phone] Outcome: Spoke with contact Important Message mailed to: Other (must enter comment)(emailed to [email or web address])    Sharah Finnell P Sargun Rummell 02/16/2019, 3:21 PM

## 2019-02-16 NOTE — Progress Notes (Signed)
Occupational Therapy Evaluation Patient Details Name: [name] MRN: [id number] DOB: Jul 12, 1934 Today's Date: 02/16/2019    History of Present Illness 84 y.o. 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 Admitted to [facility] 9/17 from home due to respiratory distress. Covid + and transferred to [place].    Clinical Impression   Pt recently DC home from a SNF where he was undergoing rehab s/p R BKA 12/27/2018. Per son, pt was home for one day when readmitted to [facility] due to SOB/Covid + and transferred to [place]. Pt usually uses a prosthetic on hi L leg, but does not have at this time, therefore required +2 Max A to complete ant/post transfer to recliner - nsg educated on transfer technique. Mod A with LB ADL and set up with UB ADL, Feel pt will benefit form short rehab stay to improve independence with mobility and ADL given recent diagnosis of Covid. Discussed plan of care with CM and son. Will follow acutely.     Follow Up Recommendations  SNF;Supervision/Assistance - 24 hour    Equipment Recommendations  None recommended by OT    Recommendations for Other Services       Precautions / Restrictions Precautions Precautions: Fall;Other (comment)(R BKA) Required Braces or Orthoses: (has prosthesis  but not at [place]) Restrictions Weight Bearing Restrictions: NWB R residual limb     Mobility Bed Mobility Overal bed mobility: Needs Assistance Bed Mobility: Supine to Sit     Supine to sit: Max assist     General bed mobility comments: needs assist with task initiatiation  Transfers Overall transfer level: Needs assistance Equipment used: (used bed pad to assist with transfer to chair) Transfers: Anterior-Posterior  Transfer(posterior scoot to chair)       Anterior-Posterior transfers: +2 physical assistance;Max assist   General transfer comment: gives some effort but still needing +2 to complete transfer    Balance Overall balance assessment: Needs assistance Sitting-balance support: Bilateral upper extremity supported Sitting balance-Leahy Scale: Poor Sitting balance - Comments: does better with increased time but still needs some support to maintain sitting w/o back supported on bed Postural control: Posterior lean Standing balance support: (did not attempt at this time as does not have prosthesis)                               ADL either performed or assessed with clinical judgement   ADL Overall ADL's : Needs assistance/impaired Eating/Feeding: Modified independent   Grooming: Set up;Sitting   Upper Body Bathing: Set up;Sitting Upper Body Bathing Details (indicate cue type and reason): supported Lower Body Bathing: Moderate assistance;Bed level   Upper Body Dressing : Minimal assistance;Sitting   Lower Body Dressing: Moderate assistance;Bed level   Toilet Transfer: Maximal assistance;+2 for physical assistance Toilet Transfer Details (indicate cue type and reason): anteriro/posterior Toileting- Clothing Manipulation and Hygiene: Maximal assistance Toileting - Clothing Manipulation Details (indicate cue type and reason): using condom cath; remarks he "pees all over himself at home"     Functional mobility during ADLs: Maximal assistance;+2 for physical assistance(ant/post)       Vision         Perception     Praxis      Pertinent Vitals/Pain Pain Assessment: Faces Faces Pain Scale: Hurts  whole lot Pain Location: R stump, (states has just had stump wrapped hence pain) Pain Intervention(s): Limited activity within patient's tolerance;Monitored during session     Hand Dominance Right   Extremity/Trunk Assessment Upper Extremity Assessment Upper Extremity  Assessment: Generalized weakness   Lower Extremity Assessment Lower Extremity Assessment: Defer to PT evaluation   Cervical / Trunk Assessment Cervical / Trunk Assessment: Kyphotic(forward head)   Communication Communication Communication: HOH(perceverates on topics needing redirection )   Cognition Arousal/Alertness: Awake/alert Behavior During Therapy: Anxious Overall Cognitive Status: Impaired/Different from baseline Area of Impairment: Attention;Following commands;Safety/judgement;Problem solving                   Current Attention Level: Sustained   Following Commands: Follows one step commands inconsistently Safety/Judgement: Decreased awareness of safety;Decreased awareness of deficits   Problem Solving: Slow processing;Decreased initiation;Difficulty sequencing;Requires verbal cues General Comments: confused about timeline. Staets his amputation was done 6 months ago adn it was performed in August on 2020.    General Comments  overall pt seems to be slightly confused, able to give some hx answers but confuses timing and where he was etc. Pt perceverates on some points needing redirection to task to progress. He presents with increased weakness, decreased balance and coordination also independence.    Exercises     Shoulder Instructions      Home Living Family/patient expects to be discharged to:: Skilled nursing facility Living Arrangements: Spouse/significant other Available Help at Discharge: Family Type of Home: House Home Access: Ramped entrance     Home Layout: One level     Bathroom Shower/Tub: Occupational psychologist: Standard Bathroom Accessibility: Yes   Home Equipment: [employer] - single point;Walker - 2 wheels;Grab bars - toilet;Wheelchair - Biomedical scientist Comments: Was only home 1 day before being readmitted to hospital      Prior Functioning/Environment Level of Independence: Independent with assistive device(s)                  OT Problem List: Decreased strength;Decreased activity tolerance;Impaired balance (sitting and/or standing);Decreased safety awareness;Decreased knowledge of precautions;Pain      OT Treatment/Interventions: Self-care/ADL training;Therapeutic exercise;Neuromuscular education;Energy conservation;DME and/or AE instruction;Therapeutic activities;Cognitive remediation/compensation;Patient/family education;Balance training    OT Goals(Current goals can be found in the care plan section) Acute Rehab OT Goals Patient Stated Goal: to go home OT Goal Formulation: With patient/family Time For Goal Achievement: 03/02/19 Potential to Achieve Goals: Good  OT Frequency: Min 2X/week   Barriers to D/C:    family members sick with Covid       Co-evaluation PT/OT/SLP Co-Evaluation/Treatment: Yes Reason for Co-Treatment: Complexity of the patient's impairments (multi-system involvement);For patient/therapist safety;To address functional/ADL transfers PT goals addressed during session: Mobility/safety with mobility OT goals addressed during session: ADL's and self-care      AM-PAC OT "6 Clicks" Daily Activity     Outcome Measure Help from another person eating meals?: None Help from another person taking care of personal grooming?: A Little Help from another person toileting, which includes using toliet, bedpan, or urinal?: A Lot Help from another person bathing (including washing, rinsing, drying)?: A Lot Help from another person to put on and taking off regular upper body clothing?: A Little Help from another person to put on and taking off regular lower body clothing?: A Lot 6 Click Score: 16   End of Session Nurse Communication: Mobility status  Activity Tolerance: Patient tolerated treatment well Patient left: in chair;with chair alarm set;with call  bell/phone within reach  OT Visit Diagnosis: Other abnormalities of gait and mobility (R26.89);Muscle weakness  (generalized) (M62.81);Other symptoms and signs involving cognitive function;Pain Pain - Right/Left: Right Pain - part of body: Leg                Time: 1610-1640 OT Time Calculation (min): 30 min Charges:  OT General Charges $OT Visit: 1 Visit OT Evaluation $OT Eval Moderate Complexity: [place], OT/L   Acute OT Clinical Specialist [place] Pager [phone] Office [phone]   [facility] 02/16/2019, 5:13 PM

## 2019-02-17 LAB — GLUCOSE, CAPILLARY
Glucose-Capillary: 146 mg/dL — ABNORMAL HIGH (ref 70–99)
Glucose-Capillary: 127 mg/dL — ABNORMAL HIGH (ref 70–99)
Glucose-Capillary: 290 mg/dL — ABNORMAL HIGH (ref 70–99)
Glucose-Capillary: 150 mg/dL — ABNORMAL HIGH (ref 70–99)
Glucose-Capillary: 205 mg/dL — ABNORMAL HIGH (ref 70–99)

## 2019-02-17 NOTE — TOC Progression Note (Addendum)
Transition of Care [facility]) - Progression Note    Patient Details  Name: [name] MRN: [id number] Date of Birth: Nov 07, 1934  Transition of Care [facility]) CM/SW [place], [place] Phone Number: [phone] 02/17/2019, 9:29 AM  Clinical Narrative:     CSW confirmed with [place] that insurance authorization with [facility] has been initiated. Insurance requesting updated PT notes, CSW faxed in hub notes from 9/25. [place] reports insurance will likely not approve until Monday.   CSW called patient's son [name] to ensure plan is still SNF despite copay amount due, [name] reports he's at the doctor's office and will call CSW back.   [name] called back and reports plan continues to be for patient to go to [facility] at discharge.   Expected Discharge Plan: Skilled Nursing Facility Barriers to Discharge: No Barriers Identified  Expected Discharge Plan and Services Expected Discharge Plan: [place]   Discharge Planning Services: CM Consult   Living arrangements for the past 2 months: [place], [place] Expected Discharge Date: 02/16/19               DME Arranged: N/A DME Agency: NA       HH Arranged: NA HH Agency: NA         Social Determinants of Health (SDOH) Interventions    Readmission Risk Interventions Readmission Risk Prevention Plan 02/12/2019 12/25/2018 12/23/2018  Transportation Screening - Complete Complete  PCP or Specialist Appt within 3-5 Days - Complete -  HRI or [place] - Complete Complete  Social Work Consult for [place] Planning/Counseling - Complete -  Palliative Care Screening - Not Applicable -  Medication Review Press photographer) - Complete Complete  PCP or Specialist appointment within 3-5 days of discharge Not Complete - -  PCP/Specialist Appt Not Complete comments not ready for dc, family requests SNF placement - -  [place] or Home Care Consult Complete - -  SW Recovery Care/Counseling  Consult Complete - -  Palliative Care Screening Not Applicable - -  Skilled Nursing Facility Complete - -  Some recent data might be hidden

## 2019-02-17 NOTE — Progress Notes (Signed)
[facility] PROGRESS NOTE    Progress Note  [name]  [id number] DOB: 1934-08-21 DOA: 02/08/2019 PCP: Corey Skains, MD     Brief Narrative:   [name] is an 84 y.o. 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  EMS was called in the ED he was found to have right lower lobe infiltrate was not noted to be hypoxic subsequently check for COVID-19 which came back positive and transferred to [facility].  Assessment/Plan:   Acute respiratory failure with hypoxia due to Pneumonia due to COVID-19 virus: He has completed his course, he is currently satting greater 92% on room air. Continue Xarelto at current home dose.  Acute metabolic encephalopathy/acute confusional state: Now resolved. Multifactorial in the setting of acute illness, steroids and underlying dementia. He was started on antipsychotics and his QTC became prolonged so these had to be DC'd. He was changed to Depakote which he has been tolerating well. The patient is currently awaiting skilled nursing facility placement by social worker.  Stable angina in the setting of known coronary artery disease: Continue conservative management as per recommended by cardiology, beta-blocker, Lipitor beta-blockers, Imdur aspirin and Xarelto.  Recent right BKA with stump wound: Followed by vascular surgery at [facility] Dr. Lucky Cowboy, last seen in their office on 02/09/2019, where he was placed on Bactrim for which he has  completed his treatment.   Continue dressing changes and silver sulfadiazine cream. Reconsult wound care to take a look at the right BKA stump, they recommended to use Santyl to stop Silvadane and to remove the staples.  Mild acute kidney injury on chronic kidney disease stage III: With a baseline creatinine of 1.3, now 1.1. Now resolved.  Chronic systolic heart failure: With an echocardiogram this year showing EF of 45%. BNP was elevated in the ED and appears to be volume overloaded he was given Lasix, with good urine output.  He has now remained euvolemic.  Essential hypertension: Continue current home regimen his blood pressure has been elevated intermittently in the hospital likely due to agitation.  History of paroxysmal atrial fibrillation: continue diltiazem metoprolol and amiodarone he is currently rate controlled continue Xarelto.  Diabetes mellitus type 2 uncontrolled with hyperglycemia: With an A1c of 8.9 in July, continue Lantus plus sliding scale.  Has had episode of hypoglycemia likely due to poor oral intake, we will hold his Lantus today.  Hyperlipidemia: Continue statins.  Normocytic anemia: The drop in hemoglobin likely due to dilutional effect.  His hemoglobin has remained stable.  History of obstructive sleep apnea: Continue oxygen.  Pressure ulcer present on admission: RN Pressure Injury Documentation: Pressure Injury 10/16/18 Perineum Medial;Lower Deep Tissue Injury - Purple or maroon localized area of discolored intact skin or blood-filled blister due to damage of underlying soft tissue from pressure and/or shear. skin between scrotum and anus (Active)  10/16/18 1118  Location: Perineum  Location Orientation: Medial;Lower  Staging: Deep Tissue Injury - Purple or maroon localized area of discolored intact skin or blood-filled blister due to damage of underlying soft tissue from pressure and/or shear.  Wound Description (Comments): skin between scrotum and  anus  Present on Admission:      Pressure Injury 12/20/18 Buttocks Left;Posterior Stage II -  Partial thickness loss of dermis presenting as a shallow open ulcer with a red, pink wound bed without slough. (Active)  12/20/18 2000  Location: Buttocks  Location Orientation: Left;Posterior  Staging: Stage II -  Partial thickness loss of dermis presenting as a shallow open ulcer with a red, pink wound bed without slough.  Wound Description (Comments):   Present on Admission: Yes     Pressure Injury 02/08/19 Buttocks Right;Lower Stage II -  Partial thickness loss of dermis presenting as a shallow open ulcer with a red, pink wound bed without slough. (Active)  02/08/19 1610  Location: Buttocks  Location Orientation: Right;Lower  Staging: Stage II -  Partial thickness loss of dermis presenting as a shallow open ulcer with a red, pink wound bed without slough.  Wound Description (Comments):   Present on Admission: Yes     Pressure Injury 02/08/19 Scrotum Posterior;Medial Stage II -  Partial thickness loss of dermis presenting as a shallow open ulcer with a red, pink wound bed without slough. (Active)  02/08/19 1611  Location: Scrotum  Location Orientation: Posterior;Medial  Staging: Stage II -  Partial thickness loss of dermis presenting as a shallow open ulcer with a red, pink wound bed without slough.  Wound Description (Comments):   Present on Admission: Yes    Estimated body mass index is 27.04 kg/m as calculated from the following:   Height as of this encounter: 5\' 7"  (1.702 m).   Weight as of this encounter: 78.3 kg. Malnutrition Type:  Nutrition Problem: Increased nutrient needs Etiology: acute illness, wound healing(COVID)   Malnutrition Characteristics:  Signs/Symptoms: estimated needs   Nutrition Interventions:  Interventions: MVI, Ensure Enlive (each supplement provides 350kcal and 20 grams of protein)    DVT prophylaxis: lovenox Family  Communication:none Disposition Plan/Barrier to D/C: SNF in am Code Status:     Code Status Orders  (From admission, onward)         Start     Ordered   02/08/19 1633  Full code  Continuous     02/08/19 1637        Code Status History    Date Active Date Inactive Code Status Order ID Comments User Context   12/20/2018 1009 01/05/2019 2212 Full Code [id number]  [name], MD ED   12/18/2018 1600 12/18/2018 2113 Full Code [id number]  Algernon Huxley, MD Inpatient   10/13/2018 1145 10/18/2018 1443 Full Code [id number]  [name] Mandes, MD Inpatient   06/03/2018 0301 06/09/2018 2013 Full Code [id number]  Lance Coon, MD Inpatient   01/16/2018 1515 01/18/2018 1635 Full Code [id number]  Dustin Flock, MD ED   11/23/2017 1346 11/28/2017 2020 Full Code [id number]  Nicholes Mango, MD Inpatient   10/26/2017 1534 10/28/2017 1854 Full Code [id number]  Bettey Costa, MD Inpatient   10/26/2017 1437 10/26/2017 1534 Full Code [id number]  Feliberto Gottron, RN Inpatient   10/24/2017 1152 10/26/2017 1437 DNR [id number]  Bettey Costa, MD Inpatient   10/15/2017 0309 10/24/2017 1152 Full Code [id number]  Arta Silence, MD Inpatient   02/10/2017 1904 02/12/2017 1908 Full Code [id number]  Katha Cabal, MD ED   02/10/2017 1638 02/10/2017 1904 Full Code [id number]  Sela Hua, PA-C Outpatient   01/08/2017 1342 01/19/2017 2103 Full Code [id number]  Loletha Grayer, MD ED   12/29/2016 2232 12/30/2016 1634 Full Code [id number]  Jannifer Franklin,  Shanon Brow, MD ED   12/20/2016 1251 12/20/2016 1842 Full Code [id number]  Algernon Huxley, MD Inpatient   10/22/2016 0040 10/24/2016 2058 Full Code [id number]  Lance Coon, MD Inpatient   10/14/2016 1201 10/14/2016 1925 Full Code [id number]  Algernon Huxley, MD Inpatient   08/30/2016 1338 09/06/2016 1941 Full Code [id number]  Epifanio Lesches, MD Inpatient   05/19/2016 0454 05/22/2016 1907 Full Code [id number]  Saundra Shelling, MD Inpatient   Advance Care Planning Activity    Advance Directive Documentation     Most  Recent Value  Type of Advance Directive  Healthcare Power of [place], Living will, Out of facility DNR (pink MOST or yellow form)  Pre-existing out of facility DNR order (yellow form or pink MOST form)  -  "MOST" Form in Place?  -        IV Access:    Peripheral IV   Procedures and diagnostic studies:   No results found.   Medical Consultants:    None.  Anti-Infectives:   none  Subjective:    Sabrina Buxbaum is in a good this morning and no complaints  Objective:    Vitals:   02/16/19 2011 02/16/19 2125 02/17/19 0448 02/17/19 0800  BP: (!) 125/58  137/60 (!) 167/96  Pulse:  (!) 57 (!) 48 (!) 57  Resp: 20     Temp: 99.8 F (37.7 C)  97.9 F (36.6 C) 98.6 F (37 C)  TempSrc: Oral   Oral  SpO2: 99%  98% 96%  Weight:      Height:       SpO2: 96 % O2 Flow Rate (L/min): 2 L/min   Intake/Output Summary (Last 24 hours) at 02/17/2019 0846 Last data filed at 02/17/2019 0500 Gross per 24 hour  Intake -  Output 400 ml  Net -400 ml   Filed Weights   02/08/19 1548  Weight: 78.3 kg    Exam: General exam: In no acute distress. Respiratory system: Good air movement and clear to auscultation. Cardiovascular system: S1 & S2 heard, RRR. No JVD. Gastrointestinal system: Abdomen is nondistended, soft and nontender.  Central nervous system: Alert and oriented x 2. No focal neurological deficits. Extremities: Bilateral lower extremity BKA's, the left is healed the right looks slightly erythematous no drainage no foul-smelling. Skin: No rashes, lesions or ulcers  Data Reviewed:    Labs: Basic Metabolic Panel: Recent Labs  Lab 02/11/19 0700 02/12/19 0350 02/13/19 0245 02/14/19 0400  NA 137 138 139 140  K 4.2 4.6 3.8 4.4  CL 102 103 103 103  CO2 27 26 26 26   GLUCOSE 253* 227* 69* 176*  BUN 41* 43* 35* 38*  CREATININE 1.13 1.03 0.97 1.11  CALCIUM 8.5* 8.4* 8.3* 8.5*  MG 1.8 1.9 1.9 2.1   GFR Estimated Creatinine Clearance: 48.8 mL/min (by C-G  formula based on SCr of 1.11 mg/dL). Liver Function Tests: Recent Labs  Lab 02/11/19 0700 02/12/19 0350 02/13/19 0245  AST 42* 36 45*  ALT 38 38 42  ALKPHOS 120 98 85  BILITOT 0.5 0.5 0.7  PROT 6.4* 5.9* 6.1*  ALBUMIN 2.7* 2.4* 2.4*   No results for input(s): LIPASE, AMYLASE in the last 168 hours. No results for input(s): AMMONIA in the last 168 hours. Coagulation profile No results for input(s): INR, PROTIME in the last 168 hours. COVID-19 Labs  No results for input(s): DDIMER, FERRITIN, LDH, CRP in the last 72 hours.  Lab Results  Component Value Date   SARSCOV2NAA POSITIVE (A) 02/08/2019  [place] NEGATIVE 01/05/2019   [place] NEGATIVE 12/20/2018   [place] NEGATIVE 12/15/2018    CBC: Recent Labs  Lab 02/11/19 0700 02/12/19 0350 02/13/19 0245 02/14/19 0400  WBC 11.3* 14.0* 12.1* 10.0  NEUTROABS 10.1* 12.1* 9.3*  --   HGB 9.9* 9.5* 10.1* 11.0*  HCT 31.4* 30.6* 32.1* 35.4*  MCV 88.2 88.7 88.2 89.4  PLT 398 378 372 408*   Cardiac Enzymes: No results for input(s): CKTOTAL, CKMB, CKMBINDEX, TROPONINI in the last 168 hours. BNP (last 3 results) No results for input(s): PROBNP in the last 8760 hours. CBG: Recent Labs  Lab 02/15/19 1943 02/16/19 0752 02/16/19 1143 02/16/19 2055 02/17/19 0753  GLUCAP 102* 99 203* 219* 127*   D-Dimer: No results for input(s): DDIMER in the last 72 hours. Hgb A1c: No results for input(s): HGBA1C in the last 72 hours. Lipid Profile: No results for input(s): CHOL, HDL, LDLCALC, TRIG, CHOLHDL, LDLDIRECT in the last 72 hours. Thyroid function studies: No results for input(s): TSH, T4TOTAL, T3FREE, THYROIDAB in the last 72 hours.  Invalid input(s): FREET3 Anemia work up: No results for input(s): VITAMINB12, FOLATE, FERRITIN, TIBC, IRON, RETICCTPCT in the last 72 hours. Sepsis Labs: Recent Labs  Lab 02/11/19 0700 02/12/19 0350 02/13/19 0245 02/14/19 0400  WBC 11.3* 14.0* 12.1* 10.0   Microbiology Recent  Results (from the past 240 hour(s))  Blood Culture (routine x 2)     Status: None   Collection Time: 02/08/19  5:57 AM   Specimen: BLOOD  Result Value Ref Range Status   Specimen Description BLOOD BLOOD LEFT HAND  Final   Special Requests   Final    BOTTLES DRAWN AEROBIC AND ANAEROBIC Blood Culture adequate volume   Culture   Final    NO GROWTH 5 DAYS Performed at [facility], [address] [place], [place] [address]    Report Status 02/13/2019 FINAL  Final  Blood Culture (routine x 2)     Status: None   Collection Time: 02/08/19  5:57 AM   Specimen: BLOOD  Result Value Ref Range Status   Specimen Description BLOOD RIGHT WRIST  Final   Special Requests   Final    BOTTLES DRAWN AEROBIC AND ANAEROBIC Blood Culture results may not be optimal due to an excessive volume of blood received in culture bottles   Culture   Final    NO GROWTH 5 DAYS Performed at [facility], [address] [place], [place] [address]    Report Status 02/13/2019 FINAL  Final  SARS Coronavirus 2 [facility] order, Performed in [facility] hospital lab) Nasopharyngeal Nasopharyngeal Swab     Status: Abnormal   Collection Time: 02/08/19  6:01 AM   Specimen: Nasopharyngeal Swab  Result Value Ref Range Status   SARS Coronavirus 2 POSITIVE (A) NEGATIVE Final    Comment: RESULT CALLED TO, READ BACK BY AND VERIFIED WITH:  LAURA CATES AT 0735 02/08/2019 SDR (NOTE) If result is NEGATIVE SARS-CoV-2 target nucleic acids are NOT DETECTED. The SARS-CoV-2 RNA is generally detectable in upper and lower  respiratory specimens during the acute phase of infection. The lowest  concentration of SARS-CoV-2 viral copies this assay can detect is 250  copies / mL. A negative result does not preclude SARS-CoV-2 infection  and should not be used as the sole basis for treatment or other  patient management decisions.  A negative result may occur with  improper specimen collection / handling, submission of  specimen other  than nasopharyngeal swab, presence of viral mutation(s)  within the  areas targeted by this assay, and inadequate number of viral copies  (<250 copies / mL). A negative result must be combined with clinical  observations, patient history, and epidemiological information. If result is POSITIVE SARS-CoV-2 target nucleic acids are DETECTED. Th e SARS-CoV-2 RNA is generally detectable in upper and lower  respiratory specimens during the acute phase of infection.  Positive  results are indicative of active infection with SARS-CoV-2.  Clinical  correlation with patient history and other diagnostic information is  necessary to determine patient infection status.  Positive results do  not rule out bacterial infection or co-infection with other viruses. If result is PRESUMPTIVE POSTIVE SARS-CoV-2 nucleic acids MAY BE PRESENT.   A presumptive positive result was obtained on the submitted specimen  and confirmed on repeat testing.  While 2019 novel coronavirus  (SARS-CoV-2) nucleic acids may be present in the submitted sample  additional confirmatory testing may be necessary for epidemiological  and / or clinical management purposes  to differentiate between  SARS-CoV-2 and other Sarbecovirus currently known to infect humans.  If clinically indicated additional testing with an alternate test  methodology [phone]) is  advised. The SARS-CoV-2 RNA is generally  detectable in upper and lower respiratory specimens during the acute  phase of infection. The expected result is Negative. Fact Sheet for Patients:  StrictlyIdeas.no Fact Sheet for Healthcare Providers: BankingDealers.co.za This test is not yet approved or cleared by the [place] FDA and has been authorized for detection and/or diagnosis of SARS-CoV-2 by FDA under an Emergency Use Authorization (EUA).  This EUA will remain in effect (meaning this test can be used) for the  duration of the COVID-19 declaration under Section 564(b)(1) of the Act, 21 U.S.C. section 360bbb-3(b)(1), unless the authorization is terminated or revoked sooner. Performed at [facility], [address] [place], [place] [address]   MRSA PCR Screening     Status: None   Collection Time: 02/08/19  6:45 PM   Specimen: Nasal Mucosa; Nasopharyngeal  Result Value Ref Range Status   MRSA by PCR NEGATIVE NEGATIVE Final    Comment:        The GeneXpert MRSA Assay (FDA approved for NASAL specimens only), is one component of a comprehensive MRSA colonization surveillance program. It is not intended to diagnose MRSA infection nor to guide or monitor treatment for MRSA infections. Performed at [facility], [place] [address] [place], [place] [address]      Medications:   . amiodarone  200 mg Oral Daily  . aspirin EC  81 mg Oral Daily  . atorvastatin  80 mg Oral QPM  . collagenase   Topical Daily  . diltiazem  120 mg Oral Daily  . divalproex  500 mg Oral Daily  . famotidine  20 mg Oral Daily  . feeding supplement (ENSURE ENLIVE)  237 mL Oral TID BM  . ferrous sulfate  325 mg Oral Q breakfast  . insulin aspart  0-20 Units Subcutaneous TID WC  . insulin aspart  0-5 Units Subcutaneous QHS  . insulin glargine  18 Units Subcutaneous Daily  . isosorbide mononitrate  60 mg Oral Daily  . mouth rinse  15 mL Mouth Rinse BID  . metoprolol tartrate  25 mg Oral TID  . multivitamin with minerals  1 tablet Oral Daily  . polyethylene glycol  17 g Oral BID  . Rivaroxaban  15 mg Oral Q supper  . senna  2 tablet Oral QHS  . silver sulfADIAZINE  1 application Topical BID  . sodium chloride flush  3 mL Intravenous Q12H  . sodium chloride flush  3 mL Intravenous Q12H  . ascorbic acid  500 mg Oral Daily  . zinc sulfate  220 mg Oral Daily   Continuous Infusions: . sodium chloride       LOS: 9 days   Charlynne Cousins  [facility]  02/17/2019, 8:46 AM

## 2019-02-18 LAB — GLUCOSE, CAPILLARY
Glucose-Capillary: 115 mg/dL — ABNORMAL HIGH (ref 70–99)
Glucose-Capillary: 352 mg/dL — ABNORMAL HIGH (ref 70–99)
Glucose-Capillary: 246 mg/dL — ABNORMAL HIGH (ref 70–99)
Glucose-Capillary: 67 mg/dL — ABNORMAL LOW (ref 70–99)
Glucose-Capillary: 76 mg/dL (ref 70–99)
Glucose-Capillary: 98 mg/dL (ref 70–99)

## 2019-02-18 NOTE — Progress Notes (Signed)
[facility] PROGRESS NOTE    Progress Note  [name]  [id number] DOB: [birth year] DOA: 02/08/2019 PCP: Corey Skains, MD     Brief Narrative:   [name] is an 84 y.o. 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  EMS was called in the ED he was found to have right lower lobe infiltrate was not noted to be hypoxic subsequently check for COVID-19 which came back positive and transferred to [facility].  Assessment/Plan:   Acute respiratory failure with hypoxia due to Pneumonia due to COVID-19 virus: Continue home dose of Xarelto. He has completed his course, he is currently satting greater 92% on room air. Continue Xarelto at current home dose. Patient is awaiting placement to skilled nursing facility [facility].  Acute metabolic encephalopathy/acute confusional state: Metabolic encephalopathy has now resolved, he is currently on Depakote which is stabilizes acute confusional state. Patient is awaiting skilled nursing facility placement.  Stable angina in the setting of known coronary artery disease: Continue conservative management as per recommended by cardiology, beta-blocker, Lipitor beta-blockers, Imdur aspirin and Xarelto.  Recent right BKA with stump wound: Reconsult wound care to take a look at the right BKA stump, they recommended to use Santyl to stop Silvadane and to remove the staples. We will follow-up with orthopedic surgeon as an outpatient.  Mild  acute kidney injury on chronic kidney disease stage III: With a baseline creatinine of 1.3, now 1.1. Now resolved.  Chronic systolic heart failure: With an echocardiogram this year showing EF of 45%. BNP was elevated in the ED and appears to be volume overloaded he was given Lasix, with good urine output.  He has now remained euvolemic.  Essential hypertension: Continue current home regimen his blood pressure has been elevated intermittently in the hospital likely due to agitation.  History of paroxysmal atrial fibrillation: continue diltiazem metoprolol and amiodarone he is currently rate controlled continue Xarelto.  Diabetes mellitus type 2 uncontrolled with hyperglycemia: With an A1c of 8.9 in July, continue Lantus plus sliding scale.  Has had episode of hypoglycemia likely due to poor oral intake, we will hold his Lantus today.  Hyperlipidemia: Continue statins.  Normocytic anemia: The drop in hemoglobin likely due to dilutional effect.  His hemoglobin has remained stable.  History of obstructive sleep apnea: Continue oxygen.  Pressure ulcer present on admission: RN Pressure Injury Documentation: Pressure Injury 10/16/18 Perineum Medial;Lower Deep Tissue Injury - Purple or maroon localized area of discolored intact skin or blood-filled blister due to damage of underlying soft tissue from pressure and/or shear. skin between scrotum and anus (Active)  10/16/18 1118  Location: Perineum  Location Orientation: Medial;Lower  Staging: Deep Tissue Injury - Purple or maroon localized area of discolored intact skin or blood-filled blister due to damage of underlying soft tissue from pressure and/or shear.  Wound Description (Comments): skin between scrotum and anus  Present on Admission:      Pressure Injury 12/20/18 Buttocks Left;Posterior Stage II -  Partial thickness loss of dermis presenting as a shallow open ulcer with a red, pink  wound bed without slough. (Active)  12/20/18 2000   Location: Buttocks  Location Orientation: Left;Posterior  Staging: Stage II -  Partial thickness loss of dermis presenting as a shallow open ulcer with a red, pink wound bed without slough.  Wound Description (Comments):   Present on Admission: Yes     Pressure Injury 02/08/19 Buttocks Right;Lower Stage II -  Partial thickness loss of dermis presenting as a shallow open ulcer with a red, pink wound bed without slough. (Active)  02/08/19 1610  Location: Buttocks  Location Orientation: Right;Lower  Staging: Stage II -  Partial thickness loss of dermis presenting as a shallow open ulcer with a red, pink wound bed without slough.  Wound Description (Comments):   Present on Admission: Yes     Pressure Injury 02/08/19 Scrotum Posterior;Medial Stage II -  Partial thickness loss of dermis presenting as a shallow open ulcer with a red, pink wound bed without slough. (Active)  02/08/19 1611  Location: Scrotum  Location Orientation: Posterior;Medial  Staging: Stage II -  Partial thickness loss of dermis presenting as a shallow open ulcer with a red, pink wound bed without slough.  Wound Description (Comments):   Present on Admission: Yes    Estimated body mass index is 27.04 kg/m as calculated from the following:   Height as of this encounter: 5\' 7"  (1.702 m).   Weight as of this encounter: 78.3 kg. Malnutrition Type:  Nutrition Problem: Increased nutrient needs Etiology: acute illness, wound healing(COVID)   Malnutrition Characteristics:  Signs/Symptoms: estimated needs   Nutrition Interventions:  Interventions: MVI, Ensure Enlive (each supplement provides 350kcal and 20 grams of protein)    DVT prophylaxis: lovenox Family Communication:none Disposition Plan/Barrier to D/C: SNF in am Code Status:     Code Status Orders  (From admission, onward)         Start     Ordered   02/08/19 1633  Full code  Continuous     02/08/19 1637        Code Status History    Date  Active Date Inactive Code Status Order ID Comments User Context   12/20/2018 1009 01/05/2019 2212 Full Code [id number]  [name], MD ED   12/18/2018 1600 12/18/2018 2113 Full Code [id number]  Algernon Huxley, MD Inpatient   10/13/2018 1145 10/18/2018 1443 Full Code [id number]  [name] Mandes, MD Inpatient   06/03/2018 0301 06/09/2018 2013 Full Code [id number]  Lance Coon, MD Inpatient   01/16/2018 1515 01/18/2018 1635 Full Code [id number]  Dustin Flock, MD ED   11/23/2017 1346 11/28/2017 2020 Full Code [id number]  Nicholes Mango, MD Inpatient   10/26/2017 1534 10/28/2017 1854 Full Code [id number]  Bettey Costa, MD Inpatient   10/26/2017 1437 10/26/2017 1534 Full Code [id number]  Feliberto Gottron, RN Inpatient   10/24/2017 1152 10/26/2017 1437 DNR [id number]  Bettey Costa, MD Inpatient   10/15/2017 0309 10/24/2017 1152 Full Code [id number]  Arta Silence, MD Inpatient   02/10/2017 1904 02/12/2017 1908 Full Code [id number]  Katha Cabal, MD ED   02/10/2017 1638 02/10/2017 1904 Full Code [id number]  Sela Hua, PA-C Outpatient   01/08/2017 1342 01/19/2017 2103 Full Code [id number]  Loletha Grayer, MD ED   12/29/2016 2232 12/30/2016 1634 Full Code [id number]  Lance Coon, MD ED   12/20/2016 1251 12/20/2016 1842 Full Code [id number]  Algernon Huxley, MD Inpatient   10/22/2016 0040 10/24/2016 2058 Full Code [id number]  Lance Coon, MD Inpatient   10/14/2016 1201 10/14/2016 1925 Full Code [id number]  Algernon Huxley, MD Inpatient   08/30/2016 1338 09/06/2016 1941 Full Code [id number]  Epifanio Lesches, MD Inpatient   05/19/2016 0454 05/22/2016 1907 Full Code [id number]  Saundra Shelling, MD Inpatient   Advance Care Planning Activity    Advance Directive Documentation     Most Recent Value  Type of Advance Directive  Healthcare Power of [place], Living will, Out of facility DNR (pink MOST or yellow form)  Pre-existing out of facility DNR order (yellow form or pink MOST form)  -  "MOST" Form in Place?  -        IV Access:     Peripheral IV   Procedures and diagnostic studies:   No results found.   Medical Consultants:    None.  Anti-Infectives:   none  Subjective:    [name] no complaints this morning.  Objective:    Vitals:   02/17/19 1945 02/17/19 2132 02/18/19 0430 02/18/19 0811  BP: (!) 156/56  (!) 165/66 (!) 166/60  Pulse: (!) 56 (!) 56 (!) 57 (!) 59  Resp: 18  17 18   Temp: 99.1 F (37.3 C)  98.8 F (37.1 C) 98.1 F (36.7 C)  TempSrc: Oral  Oral Oral  SpO2: 94%  99% 97%  Weight:      Height:       SpO2: 97 % O2 Flow Rate (L/min): 2 L/min   Intake/Output Summary (Last 24 hours) at 02/18/2019 0819 Last data filed at 02/17/2019 2000 Gross per 24 hour  Intake 237 ml  Output 500 ml  Net -263 ml   Filed Weights   02/08/19 1548  Weight: 78.3 kg    Exam: General exam: In no acute distress. Respiratory system: Good air movement and clear to auscultation. Cardiovascular system: S1 & S2 heard, RRR. No JVD. Gastrointestinal system: Abdomen is nondistended, soft and nontender.  Central nervous system: Alert and oriented x 2. No focal neurological deficits. Extremities: Bilateral lower extremity BKA's, the left is healed the right looks slightly erythematous no drainage no foul-smelling. Skin: No rashes, lesions or ulcers  Data Reviewed:    Labs: Basic Metabolic Panel: Recent Labs  Lab 02/12/19 0350 02/13/19 0245 02/14/19 0400  NA 138 139 140  K 4.6 3.8 4.4  CL 103 103 103  CO2 26 26 26   GLUCOSE 227* 69* 176*  BUN 43* 35* 38*  CREATININE 1.03 0.97 1.11  CALCIUM 8.4* 8.3* 8.5*  MG 1.9 1.9 2.1   GFR Estimated Creatinine Clearance: 48.8 mL/min (by C-G formula based on SCr of 1.11 mg/dL). Liver Function Tests: Recent Labs  Lab 02/12/19 0350 02/13/19 0245  AST 36 45*  ALT 38 42  ALKPHOS 98 85  BILITOT 0.5 0.7  PROT 5.9* 6.1*  ALBUMIN 2.4* 2.4*   No results for input(s): LIPASE, AMYLASE in the last 168 hours. No results for input(s):  AMMONIA in the last 168 hours. Coagulation profile No results for input(s): INR, PROTIME in the last 168 hours. COVID-19 Labs  No results for input(s): DDIMER, FERRITIN, LDH, CRP in the last 72 hours.  Lab Results  Component Value Date   SARSCOV2NAA POSITIVE (A) 02/08/2019   SARSCOV2NAA NEGATIVE 01/05/2019   [place] NEGATIVE 12/20/2018   [place] NEGATIVE 12/15/2018    CBC: Recent Labs  Lab 02/12/19 0350 02/13/19 0245 02/14/19 0400  WBC 14.0* 12.1* 10.0  NEUTROABS 12.1* 9.3*  --   HGB 9.5* 10.1* 11.0*  HCT 30.6* 32.1* 35.4*  MCV 88.7 88.2 89.4  PLT 378 372 408*  Cardiac Enzymes: No results for input(s): CKTOTAL, CKMB, CKMBINDEX, TROPONINI in the last 168 hours. BNP (last 3 results) No results for input(s): PROBNP in the last 8760 hours. CBG: Recent Labs  Lab 02/16/19 2055 02/17/19 0753 02/17/19 1152 02/17/19 1640 02/17/19 2125  GLUCAP 219* 127* 290* 150* 205*   D-Dimer: No results for input(s): DDIMER in the last 72 hours. Hgb A1c: No results for input(s): HGBA1C in the last 72 hours. Lipid Profile: No results for input(s): CHOL, HDL, LDLCALC, TRIG, CHOLHDL, LDLDIRECT in the last 72 hours. Thyroid function studies: No results for input(s): TSH, T4TOTAL, T3FREE, THYROIDAB in the last 72 hours.  Invalid input(s): FREET3 Anemia work up: No results for input(s): VITAMINB12, FOLATE, FERRITIN, TIBC, IRON, RETICCTPCT in the last 72 hours. Sepsis Labs: Recent Labs  Lab 02/12/19 0350 02/13/19 0245 02/14/19 0400  WBC 14.0* 12.1* 10.0   Microbiology Recent Results (from the past 240 hour(s))  MRSA PCR Screening     Status: None   Collection Time: 02/08/19  6:45 PM   Specimen: Nasal Mucosa; Nasopharyngeal  Result Value Ref Range Status   MRSA by PCR NEGATIVE NEGATIVE Final    Comment:        The GeneXpert MRSA Assay (FDA approved for NASAL specimens only), is one component of a comprehensive MRSA colonization surveillance program. It is  not intended to diagnose MRSA infection nor to guide or monitor treatment for MRSA infections. Performed at [facility], [place] [address], [place], [place] [address]      Medications:   . amiodarone  200 mg Oral Daily  . aspirin EC  81 mg Oral Daily  . atorvastatin  80 mg Oral QPM  . collagenase   Topical Daily  . diltiazem  120 mg Oral Daily  . divalproex  500 mg Oral Daily  . famotidine  20 mg Oral Daily  . feeding supplement (ENSURE ENLIVE)  237 mL Oral TID BM  . ferrous sulfate  325 mg Oral Q breakfast  . insulin aspart  0-20 Units Subcutaneous TID WC  . insulin aspart  0-5 Units Subcutaneous QHS  . insulin glargine  18 Units Subcutaneous Daily  . isosorbide mononitrate  60 mg Oral Daily  . mouth rinse  15 mL Mouth Rinse BID  . metoprolol tartrate  25 mg Oral TID  . multivitamin with minerals  1 tablet Oral Daily  . polyethylene glycol  17 g Oral BID  . Rivaroxaban  15 mg Oral Q supper  . senna  2 tablet Oral QHS  . silver sulfADIAZINE  1 application Topical BID  . sodium chloride flush  3 mL Intravenous Q12H  . sodium chloride flush  3 mL Intravenous Q12H  . ascorbic acid  500 mg Oral Daily  . zinc sulfate  220 mg Oral Daily   Continuous Infusions: . sodium chloride       LOS: 10 days   Charlynne Cousins  [facility]  02/18/2019, 8:19 AM

## 2019-02-18 NOTE — Progress Notes (Signed)
Spoke to pts son no changes to update on and that pt is stable, family had no questions

## 2019-02-19 LAB — GLUCOSE, CAPILLARY
Glucose-Capillary: 182 mg/dL — ABNORMAL HIGH (ref 70–99)
Glucose-Capillary: 100 mg/dL — ABNORMAL HIGH (ref 70–99)
Glucose-Capillary: 146 mg/dL — ABNORMAL HIGH (ref 70–99)
Glucose-Capillary: 236 mg/dL — ABNORMAL HIGH (ref 70–99)

## 2019-02-19 MED ORDER — HYDROCODONE-ACETAMINOPHEN 5-325 MG PO TABS: 1.0000 | ORAL_TABLET | Freq: Four times a day (QID) | ORAL | 0 refills | Status: DC | PRN

## 2019-02-19 NOTE — Progress Notes (Signed)
Patient blood sugar was 67 at 2143. Patient given scheduled ensure and snacks to increase sugar. Rechecked after 15 minutes and again in 30 minutes. Blood sugar stable, will continue to monitor.

## 2019-02-19 NOTE — TOC Progression Note (Addendum)
Transition of Care [facility]) - Progression Note    Patient Details  Name: Biaggio Kostura MRN: [id number] Date of Birth: [birth year]  Transition of Care [facility]) CM/SW [place], [place] Phone Number: [phone] 02/19/2019, 10:08 AM  Clinical Narrative:    12pm-[place] has insurance approval for patient to discharge. CSW will arrange transportation. Son, [name], aware.   10am-[place] still waiting on insurance approval; hopeful to have it today for discharge.    Expected Discharge Plan: Skilled Nursing Facility Barriers to Discharge: Insurance Authorization  Expected Discharge Plan and Services Expected Discharge Plan: [place]   Discharge Planning Services: CM Consult   Living arrangements for the past 2 months: [place], [place] Expected Discharge Date: 02/19/19               DME Arranged: N/A DME Agency: NA       HH Arranged: NA HH Agency: NA         Social Determinants of Health (SDOH) Interventions    Readmission Risk Interventions Readmission Risk Prevention Plan 02/12/2019 12/25/2018 12/23/2018  Transportation Screening - Complete Complete  PCP or Specialist Appt within 3-5 Days - Complete -  HRI or [place] - Complete Complete  Social Work Consult for [name] Planning/Counseling - Complete -  Palliative Care Screening - Not Applicable -  Medication Review Press photographer) - Complete Complete  PCP or Specialist appointment within 3-5 days of discharge Not Complete - -  PCP/Specialist Appt Not Complete comments not ready for dc, family requests SNF placement - -  [place] or Home Care Consult Complete - -  SW Recovery Care/Counseling Consult Complete - -  Palliative Care Screening Not Applicable - -  Skilled Nursing Facility Complete - -  Some recent data might be hidden

## 2019-02-19 NOTE — Discharge Summary (Addendum)
Physician Discharge Summary  [name] [id number] DOB: December 19, 1934 DOA: 02/08/2019  PCP: Corey Skains, MD  Admit date: 02/08/2019 Discharge date: 02/19/2019  Admitted From: SNF Disposition:  SNF  Recommendations for Outpatient Follow-up:  1. Follow up with Ortho in 1-2 weeks for his follow-up appointment he missed. 2. Please obtain BMP/CBC in one week. 3. Nutrition to reevaluate at facility.   Home Health:No Equipment/Devices:None  Discharge Condition:Stable CODE STATUS: Full Diet recommendation: Heart Healthy  Brief/Interim Summary: 84 y.o. 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 Here no events or complications from his viral pneumonia.  Acute metabolic encephalopathy/acute confusional state: Multifactorial in the setting of acute illness, steroids were imposed on his baseline dementia. He was placed on several antipsychotic agents and needs QTC started to become prolonged. Several agents were tried unsuccessfully due to his prolonged QTC. He was placed on Depakote which helped with his confusional state.  His sitter has been DC'd and he is to remain stable and comparative.  Stable angina in the setting of known coronary artery disease: Patient had anginal chest pain on 02/09/2019 relieved by nitroglycerin it was discussed with cardiology as the patient had no EKG changes echo recommended conservative management. He was continue on beta-blocker, Imdur aspirin and Xarelto. He was also continue on Lipitor.  Recent right BKA stump: Followed [facility] last seen in his office on 02/09/2019 during this time he was placed on oral Bactrim for which she has completed treatment he has remained afebrile with no leukocytosis. Wound care was consulted recommended Apply Santyl to wound bed. Cover with NS moist kerlix.  Secure with ABD and kerlix.  Change each shift.  Staples were removed.  Mild acute kidney injury on chronic kidney disease stage III: Likely prerenal azotemia resolved with IV fluid hydration.  Chronic systolic heart failure: With a 2D echo showed an EF of 45%, on admission his BNP was elevated he appeared to be fluid overloaded he was diuresed. He will continue his current home with diuretics.  Essential hypertension: No changes made to his regimen.  History of paroxysmal atrial fibrillation: Continue diltiazem  metoprolol and amiodarone currently rate controlled. Continue Xarelto.  Diabetes mellitus type 2  uncontrolled with hyperglycemia: With an A1c of 8.9, will continue long-acting insulin plus sliding scale.  His long-acting insulin has been increased along with his meal coverage.  Hyperlipidemia: Continue statins.  Normocytic anemia: He was hemoconcentrated on admission his hemoglobin had a mild drop in his hemoglobin has remained relatively stable.  History of obstructive sleep apnea: Continue CPAP at night at home.  Pressure ulcer present on admission located in the perineum, bilateral buttock: Wound care was consulted.  Severe protein caloric malnutrition: Continue ensure 4 times daily.    Discharge Instructions  Discharge Instructions    MyChart COVID-19 home monitoring program   Complete by: Feb 16, 2019    Is the patient willing to use the [facility] for home monitoring?: No   Diet - low sodium heart healthy   Complete by: As directed    Increase activity slowly   Complete by: As directed      Allergies as of 02/19/2019      Reactions   Contrast Media [iodinated Diagnostic Agents] Shortness Of Breath   SOB   Iohexol Shortness Of Breath    Desc: Respiratory Distress, Laryngedema, diaphoresis, Onset Date: [id number]   Metrizamide Shortness Of Breath   SOB SOB SOB   Latex Rash      Medication List    STOP taking these medications   sulfamethoxazole-trimethoprim 400-80 MG tablet Commonly known as: BACTRIM     TAKE these medications   acetaminophen 325 MG tablet Commonly known as: TYLENOL Take 650 mg by mouth every 6 (six) hours as needed.   amiodarone 200 MG tablet Commonly known as: PACERONE Take 200 mg by mouth daily.   ascorbic acid 250 MG tablet Commonly known as: VITAMIN C Take 1 tablet (250 mg total) by mouth 2 (two) times daily.   aspirin EC 81 MG tablet Take 1 tablet (81 mg total) by mouth daily.   atorvastatin 80 MG tablet Commonly known as: LIPITOR Take 80 mg by mouth every evening.   bisacodyl 5 MG EC tablet Commonly known  as: DULCOLAX Take 1 tablet (5 mg total) by mouth daily as needed for moderate constipation.   collagenase ointment Commonly known as: SANTYL Apply 1 application topically daily.   diltiazem 120 MG 24 hr capsule Commonly known as: CARDIZEM CD Take 120 mg by mouth daily.   divalproex 500 MG 24 hr tablet Commonly known as: DEPAKOTE ER Take 1 tablet (500 mg total) by mouth daily.   Ensure Max Protein Liqd Take 330 mLs (11 oz total) by mouth 2 (two) times daily.   ferrous sulfate 325 (65 FE) MG tablet Take 1 tablet (325 mg total) by mouth daily with breakfast.   HYDROcodone-acetaminophen 5-325 MG tablet Commonly known as: NORCO/VICODIN Take 1 tablet by mouth every 6 (six) hours as needed for moderate pain (pain score 4-6). What changed: how much to take   insulin glargine 100 UNIT/ML injection Commonly known as: LANTUS Inject 0.15 mLs (15 Units total) into the skin at bedtime.   insulin lispro 100 UNIT/ML injection Commonly known as: HumaLOG Inject 0.03 mLs (3 Units total) into the skin 3 (three) times daily with meals. What changed: additional instructions   isosorbide mononitrate 60 MG 24 hr tablet Commonly known as: IMDUR Take 1 tablet (60 mg total) by mouth daily.   metoprolol tartrate 25 MG tablet Commonly known as: LOPRESSOR Take 1 tablet (25 mg total) by mouth 3 (  three) times daily.   multivitamin with minerals Tabs tablet Take 1 tablet by mouth daily.   nitroGLYCERIN 0.4 MG SL tablet Commonly known as: NITROSTAT Place 1 tablet (0.4 mg total) under the tongue every 5 (five) minutes as needed for chest pain.   Rivaroxaban 15 MG Tabs tablet Commonly known as: XARELTO Take 1 tablet (15 mg total) by mouth daily with supper. Hold Xarelto for 3 days.  Start on January 09, 2019.   senna 8.6 MG Tabs tablet Commonly known as: SENOKOT Take 1 tablet (8.6 mg total) by mouth daily as needed for mild constipation.   silver sulfADIAZINE 1 % cream Commonly known as:  SILVADENE Apply 1 application topically daily.      Contact information for after-discharge care    Destination    [facility] .   Service: Skilled Nursing Contact information: [place] [address] [phone]             Allergies  Allergen Reactions  . Contrast Media [Iodinated Diagnostic Agents] Shortness Of Breath    SOB  . Iohexol Shortness Of Breath     Desc: Respiratory Distress, Laryngedema, diaphoresis, Onset Date: [id number]   . Metrizamide Shortness Of Breath    SOB SOB SOB   . Latex Rash    Consultations:  Cardiology   Procedures/Studies: Dg Chest Port 1 View  Result Date: 02/08/2019 CLINICAL DATA:  Respiratory distress EXAM: PORTABLE CHEST 1 VIEW COMPARISON:  12/21/2018 FINDINGS: Chronic cardiomegaly and interstitial prominence with mild scarring at the right base. New asymmetric hazy airspace type density on the right. No effusion or pneumothorax. Stable borderline heart size. Artifact from overlapping hardware. IMPRESSION: Hazy asymmetric right-sided opacity primarily suspicious for pneumonia. Background chronic bronchitic markings. Electronically Signed   By: Monte Fantasia M.D.   On: 02/08/2019 06:27      Subjective: Patient resting comfortably in bed with no new complaints.  Quite today.  Discharge Exam: Vitals:   02/19/19 0933 02/19/19 1400  BP: (!) 159/67 (!) 154/47  Pulse: 68 67  Resp: 18   Temp: 98.5 F (36.9 C)   SpO2: 94% 91%   Vitals:   02/18/19 2153 02/19/19 0400 02/19/19 0933 02/19/19 1400  BP:  125/61 (!) 159/67 (!) 154/47  Pulse: (!) 58  68 67  Resp:   18   Temp:  98.9 F (37.2 C) 98.5 F (36.9 C)   TempSrc:  Oral Oral   SpO2:   94% 91%  Weight:      Height:        General:  not in acute distress Cardiovascular: RRR, S1/S2 +, no rubs, no gallops Respiratory: CTA bilaterally, no wheezing, no rhonchi Abdominal: Soft, NT, ND, bowel sounds + Extremities: no edema, no  cyanosis    The results of significant diagnostics from this hospitalization (including imaging, microbiology, ancillary and laboratory) are listed below for reference.     Microbiology: No results found for this or any previous visit (from the past 240 hour(s)).   Labs: BNP (last 3 results) Recent Labs    12/20/18 0602 12/21/18 0054 02/08/19 0556  BNP 1,436.0* 1,436.0* [id number]*   Basic Metabolic Panel: Recent Labs  Lab 02/13/19 0245 02/14/19 0400  NA 139 140  K 3.8 4.4  CL 103 103  CO2 26 26  GLUCOSE 69* 176*  BUN 35* 38*  CREATININE 0.97 1.11  CALCIUM 8.3* 8.5*  MG 1.9 2.1   Liver Function Tests: Recent Labs  Lab 02/13/19 0245  AST 45*  ALT 42  ALKPHOS 85  BILITOT 0.7  PROT 6.1*  ALBUMIN 2.4*   No results for input(s): LIPASE, AMYLASE in the last 168 hours. No results for input(s): AMMONIA in the last 168 hours. CBC: Recent Labs  Lab 02/13/19 0245 02/14/19 0400  WBC 12.1* 10.0  NEUTROABS 9.3*  --   HGB 10.1* 11.0*  HCT 32.1* 35.4*  MCV 88.2 89.4  PLT 372 408*   Cardiac Enzymes: No results for input(s): CKTOTAL, CKMB, CKMBINDEX, TROPONINI in the last 168 hours. BNP: Invalid input(s): POCBNP CBG: Recent Labs  Lab 02/18/19 2232 02/19/19 0437 02/19/19 0933 02/19/19 1137 02/19/19 1339  GLUCAP 98 182* 100* 146* 236*   D-Dimer No results for input(s): DDIMER in the last 72 hours. Hgb A1c No results for input(s): HGBA1C in the last 72 hours. Lipid Profile No results for input(s): CHOL, HDL, LDLCALC, TRIG, CHOLHDL, LDLDIRECT in the last 72 hours. Thyroid function studies No results for input(s): TSH, T4TOTAL, T3FREE, THYROIDAB in the last 72 hours.  Invalid input(s): FREET3 Anemia work up No results for input(s): VITAMINB12, FOLATE, FERRITIN, TIBC, IRON, RETICCTPCT in the last 72 hours. Urinalysis    Component Value Date/Time   COLORURINE YELLOW (A) 10/13/2018 1205   APPEARANCEUR CLEAR (A) 10/13/2018 1205   LABSPEC 1.016 10/13/2018 1205    PHURINE 6.0 10/13/2018 1205   GLUCOSEU >=500 (A) 10/13/2018 1205   HGBUR NEGATIVE 10/13/2018 1205   [place] 10/13/2018 1205   KETONESUR NEGATIVE 10/13/2018 1205   PROTEINUR NEGATIVE 10/13/2018 1205   NITRITE NEGATIVE 10/13/2018 1205   LEUKOCYTESUR NEGATIVE 10/13/2018 1205   Sepsis Labs Invalid input(s): PROCALCITONIN,  WBC,  LACTICIDVEN Microbiology No results found for this or any previous visit (from the past 240 hour(s)).   Time coordinating discharge: Over 40 minutes  SIGNED:   Charlynne Cousins, MD  [facility] 02/19/2019, 3:21 PM Pager   If 7PM-7AM, please contact night-coverage [email or web address] Password [email or web address]

## 2019-02-19 NOTE — Progress Notes (Signed)
Pt's son updated on plan of care.  He appreciated the update.

## 2019-02-19 NOTE — TOC Transition Note (Addendum)
Transition of Care [facility]) - CM/SW Discharge Note   Patient Details  Name: [name] MRN: [id number] Date of Birth: 1934/07/25  Transition of [facility]) CM/SW Contact:  Benard [place]sted, LCSW Phone Number: 02/19/2019, 12:31 PM   Clinical Narrative:    Patient will DC to: [facility] Anticipated DC date: 02/19/2019 Family notified: Son, [name] Transport by: Corey Harold   Per MD patient ready for DC to [facility]. RN, patient, patient's family, and facility notified of DC. Discharge Summary and FL2 sent to facility. RN to call report prior to discharge ([place]). DC packet printed to [employer]. Please include signed script for Hydrocodone. Ambulance transport requested for patient.   CSW will sign off for now as social work intervention is no longer needed. Please consult [place] again if new needs arise.  Cedric Fishman, LCSW Clinical Social Worker [phone]    Final next level of care: Skilled Nursing Facility Barriers to Discharge: No Barriers Identified   Patient Goals and CMS Choice Patient states their goals for this hospitalization and ongoing recovery are:: to go to rehab   Choice offered to / list presented to : Adult Children  Discharge Placement   Existing PASRR number confirmed : 02/19/19          Patient chooses bed at: [facility] Patient to be transferred to facility by: [place] Name of family member notified: [name], son Patient and family notified of of transfer: 02/19/19  Discharge Plan and Services   Discharge Planning Services: CM Consult            DME Arranged: N/A DME Agency: NA       HH Arranged: NA [place] Agency: NA        Social Determinants of Health (Learned) Interventions     Readmission Risk Interventions Readmission Risk Prevention Plan 02/12/2019 12/25/2018 12/23/2018  Transportation Screening - Complete Complete  PCP or Specialist Appt within 3-5 Days - Complete -  HRI or [place] - Complete Complete  Social Work  Consult for [place] Planning/Counseling - Complete -  Palliative Care Screening - Not Applicable -  Medication Review Press photographer) - Complete Complete  PCP or Specialist appointment within 3-5 days of discharge Not Complete - -  PCP/Specialist Appt Not Complete comments not ready for dc, family requests SNF placement - -  [place] or Home Care Consult Complete - -  SW Recovery Care/Counseling Consult Complete - -  Palliative Care Screening Not Applicable - -  Skilled Nursing Facility Complete - -  Some recent data might be hidden

## 2019-02-19 NOTE — Plan of Care (Signed)
  Problem: Respiratory: Goal: Complications related to the disease process, condition or treatment will be avoided or minimized Outcome: Progressing   

## 2019-03-01 ENCOUNTER — Telehealth (INDEPENDENT_AMBULATORY_CARE_PROVIDER_SITE_OTHER): Payer: Self-pay | Admitting: Vascular Surgery

## 2019-03-01 NOTE — Telephone Encounter (Signed)
Patient was on schedule for tomorrow for wound check but tested positive for COVID on 02/08/19. Last week patient facility sent over pics of patient wound on stump for Dew to review and at that time Dew advised to dress with Silavadine and gauze and to bring patient in around 10/20. Patient son is requesting to keep apt for tomorrow because he says a nurse from the facility called him and told him that they are worried about an infection in the stump and separation from the incision. Per Yvetta Coder (Glass blower/designer) I called Dew and asked what to do for patient and he advised as long as patient was outside of [place] window that he could be seen. Patient son is aware of this and verbalized understanding. AS, CMA

## 2019-03-02 ENCOUNTER — Encounter (INDEPENDENT_AMBULATORY_CARE_PROVIDER_SITE_OTHER): Payer: Self-pay | Admitting: Vascular Surgery

## 2019-03-02 ENCOUNTER — Ambulatory Visit (INDEPENDENT_AMBULATORY_CARE_PROVIDER_SITE_OTHER): Payer: Medicare Other | Admitting: Vascular Surgery

## 2019-03-02 ENCOUNTER — Other Ambulatory Visit: Payer: Self-pay

## 2019-03-02 VITALS — BP 127/64 | HR 54

## 2019-03-02 DIAGNOSIS — I10 Essential (primary) hypertension: Secondary | ICD-10-CM

## 2019-03-02 DIAGNOSIS — I739 Peripheral vascular disease, unspecified: Secondary | ICD-10-CM

## 2019-03-02 DIAGNOSIS — Z89511 Acquired absence of right leg below knee: Secondary | ICD-10-CM | POA: Insufficient documentation

## 2019-03-02 DIAGNOSIS — Z794 Long term (current) use of insulin: Secondary | ICD-10-CM

## 2019-03-02 DIAGNOSIS — E119 Type 2 diabetes mellitus without complications: Secondary | ICD-10-CM

## 2019-03-02 NOTE — Assessment & Plan Note (Signed)
blood glucose control important in reducing the progression of atherosclerotic disease. Also, involved in wound healing. On appropriate medications.  

## 2019-03-02 NOTE — Assessment & Plan Note (Signed)
Now status post bilateral lower extremity amputations

## 2019-03-02 NOTE — Assessment & Plan Note (Signed)
The patient has developed a contracture and subsequent wound of the right BKA site.  I had a very long discussion with he and his son today telling him that if he cannot straighten this knee it really does not have any chance of healing and we should go ahead and proceed with an above-knee amputation.  He is not interested in proceeding with that at this point.  I am going to give him 2 to 3 weeks to see him back in the office and see if the wound cleans up and if he can straighten his knee.  If he cannot, I am going to recommend an above-knee amputation.

## 2019-03-02 NOTE — Progress Notes (Signed)
Patient ID: [name], male   DOB: 05-13-1935, 84 y.o.   MRN: [id number]  Chief Complaint  Patient presents with  . Follow-up    HPI [name] is a 84 y.o. male.  Patient returns a little over 2 months after right below-knee amputation for gangrenous changes to the right foot.  He is in a facility.  He is not straightening his knee and he has a moderate contracture at this point.  This has resulted in significant separation of his wound about 7 to 8 cm in width and the majority of the wound except for the lateral portion.  There is significant fibrinous exudate and necrotic eschar some of which I was able to debride today.  The patient reports pain.  His cognition and comprehension at this point are quite poor.  His son does accompany him today to try to tell him how important things are.   Past Medical History:  Diagnosis Date  . Arthritis   . Atrial fibrillation ([place])   . Carcinoma of prostate ([place])   . CHF (congestive heart failure) ([place])   . Chronic kidney disease   . Diabetes mellitus without complication ([place])   . ED (erectile dysfunction)   . Frequent urination   . Hyperlipidemia   . Hypertension   . Moderate mitral insufficiency   . Peripheral vascular disease ([place])   . Pneumonia 04/2016  . Prostate cancer ([place])   . Sleep apnea    OSA--USE C-PAP  . Ulcer of left foot due to type 2 diabetes mellitus ([place])   . Urinary stress incontinence, male     Past Surgical History:  Procedure Laterality Date  . AMPUTATION Left 01/12/2017   Procedure: AMPUTATION BELOW KNEE;  Surgeon: Algernon Huxley, MD;  Location: [facility] ORS;  Service: General;  Laterality: Left;  . AMPUTATION Right 12/27/2018   Procedure: AMPUTATION BELOW KNEE;  Surgeon: Algernon Huxley, MD;  Location: [facility]S;  Service: General;  Laterality: Right;  . APLIGRAFT PLACEMENT Left 10/15/2016   Procedure: APLIGRAFT PLACEMENT;  Surgeon: Albertine Patricia, DPM;  Location: [facility];  Service: Podiatry;   Laterality: Left;  necrotic ulcer  . CHOLECYSTECTOMY    . COLONOSCOPY WITH PROPOFOL N/A 01/02/2019   Procedure: COLONOSCOPY WITH PROPOFOL;  Surgeon: Lucilla Lame, MD;  Location: [facility] ENDOSCOPY;  Service: Endoscopy;  Laterality: N/A;  . COLONOSCOPY WITH PROPOFOL N/A 01/05/2019   Procedure: COLONOSCOPY WITH PROPOFOL;  Surgeon: Lucilla Lame, MD;  Location: [facility] ENDOSCOPY;  Service: Endoscopy;  Laterality: N/A;  . ESOPHAGOGASTRODUODENOSCOPY (EGD) WITH PROPOFOL N/A 01/02/2019   Procedure: ESOPHAGOGASTRODUODENOSCOPY (EGD) WITH PROPOFOL;  Surgeon: Lucilla Lame, MD;  Location: [facility] ENDOSCOPY;  Service: Endoscopy;  Laterality: N/A;  . EYE SURGERY Bilateral    Cataract Extraction with IOL  . HIP ARTHROPLASTY Right 10/13/2018   Procedure: ARTHROPLASTY BIPOLAR HIP (HEMIARTHROPLASTY);  Surgeon: Earnestine Leys, MD;  Location: [facility]S;  Service: Orthopedics;  Laterality: Right;  . IRRIGATION AND DEBRIDEMENT FOOT Left 10/15/2016   Procedure: IRRIGATION AND DEBRIDEMENT FOOT-EXCISIONAL DEBRIDEMENT OF SKIN 3RD, 4TH AND 5TH TOES WITH APPLICATION OF APLIGRAFT;  Surgeon: Albertine Patricia, DPM;  Location: [facility];  Service: Podiatry;  Laterality: Left;  necrotic,gangrene  . IRRIGATION AND DEBRIDEMENT FOOT Left 12/09/2016   Procedure: IRRIGATION AND DEBRIDEMENT FOOT-MUSCLE/FASCIA, RESECTION OF FOURTH AND FIFTH METATARSAL NECROTIC BONE;  Surgeon: Albertine Patricia, DPM;  Location: [facility];  Service: Podiatry;  Laterality: Left;  . IRRIGATION AND DEBRIDEMENT FOOT Right 12/23/2018   Procedure: IRRIGATION AND DEBRIDEMENT FOOT  and wound vac application;  Surgeon: Samara Deist, DPM;  Location: [facility] ORS;  Service: Podiatry;  Laterality: Right;  . LEFT HEART CATH AND CORONARY ANGIOGRAPHY N/A 06/05/2018   Procedure: LEFT HEART CATH AND CORONARY ANGIOGRAPHY;  Surgeon: Yolonda Kida, MD;  Location: [place] CV LAB;  Service: Cardiovascular;  Laterality: N/A;  . LOWER EXTREMITY ANGIOGRAPHY Left 08/16/2016   Procedure: Lower  Extremity Angiography;  Surgeon: Algernon Huxley, MD;  Location: [place] CV LAB;  Service: Cardiovascular;  Laterality: Left;  . LOWER EXTREMITY ANGIOGRAPHY Left 09/01/2016   Procedure: Lower Extremity Angiography;  Surgeon: Algernon Huxley, MD;  Location: [place] CV LAB;  Service: Cardiovascular;  Laterality: Left;  . LOWER EXTREMITY ANGIOGRAPHY Left 10/14/2016   Procedure: Lower Extremity Angiography;  Surgeon: Algernon Huxley, MD;  Location: [place] CV LAB;  Service: Cardiovascular;  Laterality: Left;  . LOWER EXTREMITY ANGIOGRAPHY Left 12/20/2016   Procedure: Lower Extremity Angiography;  Surgeon: Algernon Huxley, MD;  Location: [place] CV LAB;  Service: Cardiovascular;  Laterality: Left;  . LOWER EXTREMITY ANGIOGRAPHY Right 12/18/2018   Procedure: LOWER EXTREMITY ANGIOGRAPHY;  Surgeon: Algernon Huxley, MD;  Location: [place] CV LAB;  Service: Cardiovascular;  Laterality: Right;  . LOWER EXTREMITY INTERVENTION  09/01/2016   Procedure: Lower Extremity Intervention;  Surgeon: Algernon Huxley, MD;  Location: [place] CV LAB;  Service: Cardiovascular;;  . PROSTATE SURGERY     removal  . TONSILLECTOMY     as a child      Allergies  Allergen Reactions  . Contrast Media [Iodinated Diagnostic Agents] Shortness Of Breath    SOB  . Iohexol Shortness Of Breath     Desc: Respiratory Distress, Laryngedema, diaphoresis, Onset Date: [id number]   . Metrizamide Shortness Of Breath    SOB SOB SOB   . Latex Rash    Current Outpatient Medications  Medication Sig Dispense Refill  . acetaminophen (TYLENOL) 325 MG tablet Take 650 mg by mouth every 6 (six) hours as needed.    Marland Kitchen amiodarone (PACERONE) 200 MG tablet Take 200 mg by mouth daily.    Marland Kitchen aspirin EC 81 MG tablet Take 1 tablet (81 mg total) by mouth daily. 150 tablet 2  . atorvastatin (LIPITOR) 80 MG tablet Take 80 mg by mouth every evening.    . bisacodyl (DULCOLAX) 5 MG EC tablet Take 1 tablet (5 mg total) by mouth daily as needed  for moderate constipation. 30 tablet 0  . chlorthalidone (HYGROTON) 25 MG tablet Take 25 mg by mouth daily.    . Cholecalciferol (VITAMIN D-3) 25 MCG (1000 UT) CAPS Take by mouth.    . diltiazem (CARDIZEM CD) 120 MG 24 hr capsule Take 120 mg by mouth daily.    . divalproex (DEPAKOTE ER) 500 MG 24 hr tablet Take 1 tablet (500 mg total) by mouth daily.    Marland Kitchen doxycycline (VIBRAMYCIN) 100 MG capsule Take 100 mg by mouth 2 (two) times daily.    . ferrous sulfate 325 (65 FE) MG tablet Take 1 tablet (325 mg total) by mouth daily with breakfast. 30 tablet 3  . HYDROcodone-acetaminophen (NORCO/VICODIN) 5-325 MG tablet Take 1 tablet by mouth every 6 (six) hours as needed for moderate pain (pain score 4-6). 10 tablet 0  . insulin glargine (LANTUS) 100 UNIT/ML injection Inject 0.15 mLs (15 Units total) into the skin at bedtime. 1 vial 0  . insulin lispro (HUMALOG) 100 UNIT/ML injection Inject 0.03 mLs (3 Units total) into the  skin 3 (three) times daily with meals. (Patient taking differently: Inject 3 Units into the skin 3 (three) times daily with meals. Plus 1 additional unit for every 50 BG points over 200 (max 5 additional units per dose)) 10 mL 0  . isosorbide mononitrate (IMDUR) 60 MG 24 hr tablet Take 1 tablet (60 mg total) by mouth daily. 30 tablet 0  . metFORMIN (GLUCOPHAGE) 500 MG tablet Take by mouth daily.    . metoprolol succinate (TOPROL-XL) 25 MG 24 hr tablet Take 25 mg by mouth daily.    . Multiple Vitamin (MULTIVITAMIN WITH MINERALS) TABS tablet Take 1 tablet by mouth daily.    . nitroGLYCERIN (NITROSTAT) 0.4 MG SL tablet Place 1 tablet (0.4 mg total) under the tongue every 5 (five) minutes as needed for chest pain. 20 tablet 0  . potassium chloride (KLOR-CON) 20 MEQ packet Take by mouth 2 (two) times daily.    . Rivaroxaban (XARELTO) 15 MG TABS tablet Take 1 tablet (15 mg total) by mouth daily with supper. Hold Xarelto for 3 days.  Start on January 09, 2019. 30 tablet 1  . senna (SENOKOT) 8.6 MG  TABS tablet Take 1 tablet (8.6 mg total) by mouth daily as needed for mild constipation. 120 tablet 0  . silver sulfADIAZINE (SILVADENE) 1 % cream Apply 1 application topically daily. 50 g 0  . vitamin C (VITAMIN C) 250 MG tablet Take 1 tablet (250 mg total) by mouth 2 (two) times daily. (Patient taking differently: Take 1,000 mg by mouth daily. )    . zinc sulfate 220 (50 Zn) MG capsule Take 220 mg by mouth daily.    . Ensure Max Protein (ENSURE MAX PROTEIN) LIQD Take 330 mLs (11 oz total) by mouth 2 (two) times daily.     Current Facility-Administered Medications  Medication Dose Route Frequency Provider Last Rate Last Dose  . diphenhydrAMINE (BENADRYL) injection 50 mg  50 mg Intravenous Once PRN Stegmayer, Joelene Millin A, PA-C      . famotidine (PEPCID) tablet 40 mg  40 mg Oral Once PRN Stegmayer, Janalyn Harder, PA-C            Physical Exam BP 127/64 (BP Location: Right Arm, Patient Position: Sitting, Cuff Size: Normal)   Pulse (!) 54  Gen:  WD/WN, NAD Skin: Right BKA wound as above with significant fibrinous exudate and eschar.  There is a fairly good granulation base below most of the exudate.  The knee contracture remains concerning.     Assessment/Plan:  Type 2 diabetes mellitus treated with insulin (HCC) blood glucose control important in reducing the progression of atherosclerotic disease. Also, involved in wound healing. On appropriate medications.   Essential hypertension blood pressure control important in reducing the progression of atherosclerotic disease. On appropriate oral medications.   PAD (peripheral artery disease) (HCC) Now status post bilateral lower extremity amputations  Hx of BKA, right ([name]) The patient has developed a contracture and subsequent wound of the right BKA site.  I had a very long discussion with he and his son today telling him that if he cannot straighten this knee it really does not have any chance of healing and we should go ahead and  proceed with an above-knee amputation.  He is not interested in proceeding with that at this point.  I am going to give him 2 to 3 weeks to see him back in the office and see if the wound cleans up and if he can straighten his knee.  If  he cannot, I am going to recommend an above-knee amputation.      Leotis Pain 03/02/2019, 12:10 PM   This note was created with Dragon medical transcription system.  Any errors from dictation are unintentional.

## 2019-03-02 NOTE — Assessment & Plan Note (Signed)
blood pressure control important in reducing the progression of atherosclerotic disease. On appropriate oral medications.  

## 2019-03-11 ENCOUNTER — Encounter: Payer: Self-pay | Admitting: Vascular Surgery

## 2019-03-13 ENCOUNTER — Ambulatory Visit (INDEPENDENT_AMBULATORY_CARE_PROVIDER_SITE_OTHER): Payer: Medicare Other | Admitting: Vascular Surgery

## 2019-03-23 ENCOUNTER — Encounter (INDEPENDENT_AMBULATORY_CARE_PROVIDER_SITE_OTHER): Payer: Self-pay | Admitting: Vascular Surgery

## 2019-03-23 ENCOUNTER — Other Ambulatory Visit: Payer: Self-pay

## 2019-03-23 ENCOUNTER — Ambulatory Visit (INDEPENDENT_AMBULATORY_CARE_PROVIDER_SITE_OTHER): Payer: Medicare Other | Admitting: Vascular Surgery

## 2019-03-23 VITALS — BP 147/52 | HR 60 | Resp 16 | Ht 67.0 in | Wt 147.0 lb

## 2019-03-23 DIAGNOSIS — I739 Peripheral vascular disease, unspecified: Secondary | ICD-10-CM

## 2019-03-23 DIAGNOSIS — Z794 Long term (current) use of insulin: Secondary | ICD-10-CM

## 2019-03-23 DIAGNOSIS — Z89511 Acquired absence of right leg below knee: Secondary | ICD-10-CM

## 2019-03-23 DIAGNOSIS — E119 Type 2 diabetes mellitus without complications: Secondary | ICD-10-CM

## 2019-03-23 DIAGNOSIS — I10 Essential (primary) hypertension: Secondary | ICD-10-CM

## 2019-03-23 NOTE — Assessment & Plan Note (Signed)
He is able to straighten his knee better so I think there is still a chance of saving this below-knee amputation.  I am concerned if he goes home that the wound care will be suboptimal but hopefully home health can help keep this under control.  He is a difficult patient with a litany of medical issues and his mental status is not particularly clear at this point.  I will plan about a 3-week follow-up for a wound check

## 2019-03-23 NOTE — Progress Notes (Signed)
Patient ID: [name], male   DOB: 1934-07-14, 84 y.o.   MRN: [id number]  Chief Complaint  Patient presents with  . Wound Check    wound check    HPI [name] is a 84 y.o. male.  Patient returns in follow up. He is perseverating about being in the nursing home.  He has a litany of complaints but his wound is actually doing a little bit better.  There is a good base of granulation tissue.  Fibrinous exudate persists as does some loose eschar although it is slightly smaller than it was on his last visit.  He can still not straighten his knee completely but it is actually pretty close.   Past Medical History:  Diagnosis Date  . Arthritis   . Atrial fibrillation ([place])   . Carcinoma of prostate ([place])   . CHF (congestive heart failure) ([place])   . Chronic kidney disease   . Diabetes mellitus without complication ([place])   . ED (erectile dysfunction)   . Frequent urination   . Hyperlipidemia   . Hypertension   . Moderate mitral insufficiency   . Peripheral vascular disease ([place])   . Pneumonia 04/2016  . Prostate cancer ([place])   . Sleep apnea    OSA--USE C-PAP  . Ulcer of left foot due to type 2 diabetes mellitus ([place])   . Urinary stress incontinence, male     Past Surgical History:  Procedure Laterality Date  . AMPUTATION Left 01/12/2017   Procedure: AMPUTATION BELOW KNEE;  Surgeon: Algernon Huxley, MD;  Location: [facility];  Service: General;  Laterality: Left;  . AMPUTATION Right 12/27/2018   Procedure: AMPUTATION BELOW KNEE;  Surgeon: Algernon Huxley, MD;  Location: [facility];  Service: General;  Laterality: Right;  . APLIGRAFT PLACEMENT Left 10/15/2016   Procedure: APLIGRAFT PLACEMENT;  Surgeon: Albertine Patricia, DPM;  Location: [facility];  Service: Podiatry;  Laterality: Left;  necrotic ulcer  . CHOLECYSTECTOMY    . COLONOSCOPY WITH PROPOFOL N/A 01/02/2019   Procedure: COLONOSCOPY WITH PROPOFOL;  Surgeon: Lucilla Lame, MD;  Location: [facility] ENDOSCOPY;  Service:  Endoscopy;  Laterality: N/A;  . COLONOSCOPY WITH PROPOFOL N/A 01/05/2019   Procedure: COLONOSCOPY WITH PROPOFOL;  Surgeon: Lucilla Lame, MD;  Location: [facility] ENDOSCOPY;  Service: Endoscopy;  Laterality: N/A;  . ESOPHAGOGASTRODUODENOSCOPY (EGD) WITH PROPOFOL N/A 01/02/2019   Procedure: ESOPHAGOGASTRODUODENOSCOPY (EGD) WITH PROPOFOL;  Surgeon: Lucilla Lame, MD;  Location: [facility] ENDOSCOPY;  Service: Endoscopy;  Laterality: N/A;  . EYE SURGERY Bilateral    Cataract Extraction with IOL  . HIP ARTHROPLASTY Right 10/13/2018   Procedure: ARTHROPLASTY BIPOLAR HIP (HEMIARTHROPLASTY);  Surgeon: Earnestine Leys, MD;  Location: [facility] ORS;  Service: Orthopedics;  Laterality: Right;  . IRRIGATION AND DEBRIDEMENT FOOT Left 10/15/2016   Procedure: IRRIGATION AND DEBRIDEMENT FOOT-EXCISIONAL DEBRIDEMENT OF SKIN 3RD, 4TH AND 5TH TOES WITH APPLICATION OF APLIGRAFT;  Surgeon: Albertine Patricia, DPM;  Location: [facility];  Service: Podiatry;  Laterality: Left;  necrotic,gangrene  . IRRIGATION AND DEBRIDEMENT FOOT Left 12/09/2016   Procedure: IRRIGATION AND DEBRIDEMENT FOOT-MUSCLE/FASCIA, RESECTION OF FOURTH AND FIFTH METATARSAL NECROTIC BONE;  Surgeon: Albertine Patricia, DPM;  Location: [facility];  Service: Podiatry;  Laterality: Left;  . IRRIGATION AND DEBRIDEMENT FOOT Right 12/23/2018   Procedure: IRRIGATION AND DEBRIDEMENT FOOT and wound vac application;  Surgeon: Samara Deist, DPM;  Location: [facility];  Service: Podiatry;  Laterality: Right;  . LEFT HEART CATH AND CORONARY ANGIOGRAPHY N/A 06/05/2018   Procedure: LEFT HEART CATH AND  CORONARY ANGIOGRAPHY;  Surgeon: Yolonda Kida, MD;  Location: [place] CV LAB;  Service: Cardiovascular;  Laterality: N/A;  . LOWER EXTREMITY ANGIOGRAPHY Left 08/16/2016   Procedure: Lower Extremity Angiography;  Surgeon: Algernon Huxley, MD;  Location: [place] CV LAB;  Service: Cardiovascular;  Laterality: Left;  . LOWER EXTREMITY ANGIOGRAPHY Left 09/01/2016   Procedure: Lower Extremity  Angiography;  Surgeon: Algernon Huxley, MD;  Location: [place] CV LAB;  Service: Cardiovascular;  Laterality: Left;  . LOWER EXTREMITY ANGIOGRAPHY Left 10/14/2016   Procedure: Lower Extremity Angiography;  Surgeon: Algernon Huxley, MD;  Location: [place] CV LAB;  Service: Cardiovascular;  Laterality: Left;  . LOWER EXTREMITY ANGIOGRAPHY Left 12/20/2016   Procedure: Lower Extremity Angiography;  Surgeon: Algernon Huxley, MD;  Location: [place] CV LAB;  Service: Cardiovascular;  Laterality: Left;  . LOWER EXTREMITY ANGIOGRAPHY Right 12/18/2018   Procedure: LOWER EXTREMITY ANGIOGRAPHY;  Surgeon: Algernon Huxley, MD;  Location: [place] CV LAB;  Service: Cardiovascular;  Laterality: Right;  . LOWER EXTREMITY INTERVENTION  09/01/2016   Procedure: Lower Extremity Intervention;  Surgeon: Algernon Huxley, MD;  Location: [place] CV LAB;  Service: Cardiovascular;;  . PROSTATE SURGERY     removal  . TONSILLECTOMY     as a child      Allergies  Allergen Reactions  . Contrast Media [Iodinated Diagnostic Agents] Shortness Of Breath    SOB  . Iohexol Shortness Of Breath     Desc: Respiratory Distress, Laryngedema, diaphoresis, Onset Date: [id number]   . Metrizamide Shortness Of Breath    SOB SOB SOB   . Latex Rash    Current Outpatient Medications  Medication Sig Dispense Refill  . acetaminophen (TYLENOL) 325 MG tablet Take 650 mg by mouth every 6 (six) hours as needed.    Marland Kitchen amiodarone (PACERONE) 200 MG tablet Take 200 mg by mouth daily.    Marland Kitchen aspirin EC 81 MG tablet Take 1 tablet (81 mg total) by mouth daily. 150 tablet 2  . atorvastatin (LIPITOR) 80 MG tablet Take 80 mg by mouth every evening.    . bisacodyl (DULCOLAX) 5 MG EC tablet Take 1 tablet (5 mg total) by mouth daily as needed for moderate constipation. 30 tablet 0  . chlorthalidone (HYGROTON) 25 MG tablet Take 25 mg by mouth daily.    . Cholecalciferol (VITAMIN D-3) 25 MCG (1000 UT) CAPS Take by mouth.    . diltiazem  (CARDIZEM CD) 120 MG 24 hr capsule Take 120 mg by mouth daily.    . divalproex (DEPAKOTE ER) 500 MG 24 hr tablet Take 1 tablet (500 mg total) by mouth daily.    . Ensure Max Protein (ENSURE MAX PROTEIN) LIQD Take 330 mLs (11 oz total) by mouth 2 (two) times daily.    . ferrous sulfate 325 (65 FE) MG tablet Take 1 tablet (325 mg total) by mouth daily with breakfast. 30 tablet 3  . insulin glargine (LANTUS) 100 UNIT/ML injection Inject 0.15 mLs (15 Units total) into the skin at bedtime. 1 vial 0  . insulin lispro (HUMALOG) 100 UNIT/ML injection Inject 0.03 mLs (3 Units total) into the skin 3 (three) times daily with meals. (Patient taking differently: Inject 3 Units into the skin 3 (three) times daily with meals. Plus 1 additional unit for every 50 BG points over 200 (max 5 additional units per dose)) 10 mL 0  . isosorbide mononitrate (IMDUR) 60 MG 24 hr tablet Take 1 tablet (60 mg total)  by mouth daily. 30 tablet 0  . metFORMIN (GLUCOPHAGE) 500 MG tablet Take by mouth daily.    . metoprolol succinate (TOPROL-XL) 25 MG 24 hr tablet Take 25 mg by mouth daily.    . Multiple Vitamin (MULTIVITAMIN WITH MINERALS) TABS tablet Take 1 tablet by mouth daily.    . nitroGLYCERIN (NITROSTAT) 0.4 MG SL tablet Place 1 tablet (0.4 mg total) under the tongue every 5 (five) minutes as needed for chest pain. 20 tablet 0  . potassium chloride (KLOR-CON) 20 MEQ packet Take by mouth 2 (two) times daily.    . Rivaroxaban (XARELTO) 15 MG TABS tablet Take 1 tablet (15 mg total) by mouth daily with supper. Hold Xarelto for 3 days.  Start on January 09, 2019. 30 tablet 1  . senna (SENOKOT) 8.6 MG TABS tablet Take 1 tablet (8.6 mg total) by mouth daily as needed for mild constipation. 120 tablet 0  . silver sulfADIAZINE (SILVADENE) 1 % cream Apply 1 application topically daily. 50 g 0  . vitamin C (VITAMIN C) 250 MG tablet Take 1 tablet (250 mg total) by mouth 2 (two) times daily. (Patient taking differently: Take 1,000 mg by  mouth daily. )    . zinc sulfate 220 (50 Zn) MG capsule Take 220 mg by mouth daily.    Marland Kitchen doxycycline (VIBRAMYCIN) 100 MG capsule Take 100 mg by mouth 2 (two) times daily.    Marland Kitchen HYDROcodone-acetaminophen (NORCO/VICODIN) 5-325 MG tablet Take 1 tablet by mouth every 6 (six) hours as needed for moderate pain (pain score 4-6). (Patient not taking: Reported on 03/23/2019) 10 tablet 0   Current Facility-Administered Medications  Medication Dose Route Frequency Provider Last Rate Last Dose  . diphenhydrAMINE (BENADRYL) injection 50 mg  50 mg Intravenous Once PRN Stegmayer, Joelene Millin A, PA-C      . famotidine (PEPCID) tablet 40 mg  40 mg Oral Once PRN Stegmayer, Janalyn Harder, PA-C            Physical Exam BP (!) 147/52 (BP Location: Right Arm)   Pulse 60   Resp 16   Ht 5\' 7"  (1.702 m)   Wt 147 lb (66.7 kg)   BMI 23.02 kg/m  Gen:  WD/WN, NAD Skin: There is a good base of granulation tissue.  Fibrinous exudate persists as does some loose eschar although it is slightly smaller than it was on his last visit.  He can still not straighten his knee completely but it is actually pretty close.     Assessment/Plan:  Type 2 diabetes mellitus treated with insulin (HCC) blood glucose control important in reducing the progression of atherosclerotic disease. Also, involved in wound healing. On appropriate medications.   Essential hypertension blood pressure control important in reducing the progression of atherosclerotic disease. On appropriate oral medications.   PAD (peripheral artery disease) ([place]) Now status post bilateral lower extremity amputations  Hx of BKA, right ([place]) He is able to straighten his knee better so I think there is still a chance of saving this below-knee amputation.  I am concerned if he goes home that the wound care will be suboptimal but hopefully home health can help keep this under control.  He is a difficult patient with a litany of medical issues and his mental  status is not particularly clear at this point.  I will plan about a 3-week follow-up for a wound check      Leotis Pain 03/23/2019, 10:39 AM   This note was created with Dragon medical transcription  system.  Any errors from dictation are unintentional.

## 2019-03-27 DIAGNOSIS — I480 Paroxysmal atrial fibrillation: Secondary | ICD-10-CM | POA: Insufficient documentation

## 2019-03-30 ENCOUNTER — Telehealth (INDEPENDENT_AMBULATORY_CARE_PROVIDER_SITE_OTHER): Payer: Self-pay

## 2019-03-30 NOTE — Telephone Encounter (Signed)
The nurse has been aware with medical advice from Dr Lucky Cowboy and will inform the patient with information

## 2019-04-06 ENCOUNTER — Telehealth (INDEPENDENT_AMBULATORY_CARE_PROVIDER_SITE_OTHER): Payer: Self-pay

## 2019-04-06 ENCOUNTER — Emergency Department: Payer: Medicare Other

## 2019-04-06 ENCOUNTER — Other Ambulatory Visit: Payer: Self-pay

## 2019-04-06 ENCOUNTER — Encounter: Payer: Self-pay | Admitting: Emergency Medicine

## 2019-04-06 ENCOUNTER — Emergency Department
Admission: EM | Admit: 2019-04-06 | Discharge: 2019-04-06 | Disposition: A | Discharge status: Home / Self Care | Payer: Medicare Other | Attending: Emergency Medicine | Admitting: Emergency Medicine

## 2019-04-06 DIAGNOSIS — E1122 Type 2 diabetes mellitus with diabetic chronic kidney disease: Secondary | ICD-10-CM | POA: Insufficient documentation

## 2019-04-06 DIAGNOSIS — Z91040 Latex allergy status: Secondary | ICD-10-CM | POA: Insufficient documentation

## 2019-04-06 DIAGNOSIS — Z89511 Acquired absence of right leg below knee: Secondary | ICD-10-CM | POA: Insufficient documentation

## 2019-04-06 DIAGNOSIS — Z89512 Acquired absence of left leg below knee: Secondary | ICD-10-CM | POA: Insufficient documentation

## 2019-04-06 DIAGNOSIS — I5032 Chronic diastolic (congestive) heart failure: Secondary | ICD-10-CM | POA: Diagnosis not present

## 2019-04-06 DIAGNOSIS — R079 Chest pain, unspecified: Secondary | ICD-10-CM | POA: Diagnosis not present

## 2019-04-06 DIAGNOSIS — I130 Hypertensive heart and chronic kidney disease with heart failure and stage 1 through stage 4 chronic kidney disease, or unspecified chronic kidney disease: Secondary | ICD-10-CM | POA: Insufficient documentation

## 2019-04-06 DIAGNOSIS — Z96641 Presence of right artificial hip joint: Secondary | ICD-10-CM | POA: Diagnosis not present

## 2019-04-06 DIAGNOSIS — Z794 Long term (current) use of insulin: Secondary | ICD-10-CM | POA: Diagnosis not present

## 2019-04-06 DIAGNOSIS — Z87891 Personal history of nicotine dependence: Secondary | ICD-10-CM | POA: Diagnosis not present

## 2019-04-06 DIAGNOSIS — Z79899 Other long term (current) drug therapy: Secondary | ICD-10-CM | POA: Diagnosis not present

## 2019-04-06 DIAGNOSIS — Z7982 Long term (current) use of aspirin: Secondary | ICD-10-CM | POA: Insufficient documentation

## 2019-04-06 DIAGNOSIS — Z8546 Personal history of malignant neoplasm of prostate: Secondary | ICD-10-CM | POA: Diagnosis not present

## 2019-04-06 DIAGNOSIS — N189 Chronic kidney disease, unspecified: Secondary | ICD-10-CM | POA: Insufficient documentation

## 2019-04-06 LAB — CBC WITH DIFFERENTIAL/PLATELET
Abs Immature Granulocytes: 0.05 10*3/uL (ref 0.00–0.07)
Basophils Absolute: 0.1 10*3/uL (ref 0.0–0.1)
Basophils Relative: 1 %
Eosinophils Absolute: 0.7 10*3/uL — ABNORMAL HIGH (ref 0.0–0.5)
Eosinophils Relative: 8 %
HCT: 31.2 % — ABNORMAL LOW (ref 39.0–52.0)
Hemoglobin: 10.3 g/dL — ABNORMAL LOW (ref 13.0–17.0)
Immature Granulocytes: 1 %
Lymphocytes Relative: 13 %
Lymphs Abs: 1.2 10*3/uL (ref 0.7–4.0)
MCH: 28.3 pg (ref 26.0–34.0)
MCHC: 33 g/dL (ref 30.0–36.0)
MCV: 85.7 fL (ref 80.0–100.0)
Monocytes Absolute: 0.7 10*3/uL (ref 0.1–1.0)
Monocytes Relative: 7 %
Neutro Abs: 6.9 10*3/uL (ref 1.7–7.7)
Neutrophils Relative %: 70 %
Platelets: 253 10*3/uL (ref 150–400)
RBC: 3.64 MIL/uL — ABNORMAL LOW (ref 4.22–5.81)
RDW: 17.1 % — ABNORMAL HIGH (ref 11.5–15.5)
WBC: 9.7 10*3/uL (ref 4.0–10.5)
nRBC: 0 % (ref 0.0–0.2)

## 2019-04-06 LAB — BASIC METABOLIC PANEL
Anion gap: 8 (ref 5–15)
BUN: 31 mg/dL — ABNORMAL HIGH (ref 8–23)
CO2: 25 mmol/L (ref 22–32)
Calcium: 8.1 mg/dL — ABNORMAL LOW (ref 8.9–10.3)
Chloride: 106 mmol/L (ref 98–111)
Creatinine, Ser: 1.02 mg/dL (ref 0.61–1.24)
GFR calc Af Amer: >60 mL/min (ref >60)
GFR calc non Af Amer: >60 mL/min (ref >60)
Glucose, Bld: 323 mg/dL — ABNORMAL HIGH (ref 70–99)
Potassium: 3.7 mmol/L (ref 3.5–5.1)
Sodium: 139 mmol/L (ref 135–145)

## 2019-04-06 LAB — TROPONIN I (HIGH SENSITIVITY)
Troponin I (High Sensitivity): 37 ng/L — ABNORMAL HIGH (ref <18)
Troponin I (High Sensitivity): 46 ng/L — ABNORMAL HIGH (ref <18)

## 2019-04-06 MED ORDER — BACITRACIN-NEOMYCIN-POLYMYXIN 400-5-5000 EX OINT
TOPICAL_OINTMENT | CUTANEOUS | Status: AC
  Administered 2019-04-06: 16:00:00
  Filled 2019-04-06: qty 1

## 2019-04-06 MED ORDER — ISOSORBIDE MONONITRATE ER 60 MG PO TB24: 60.0000 mg | ORAL_TABLET | Freq: Every day | ORAL | 0 refills | Status: DC

## 2019-04-06 NOTE — Discharge Instructions (Signed)
Stop taking the Imdur 30 mg daily, and replace it with the 60 mg that have been prescribed today.  Call Dr. Alveria Apley office on Monday to arrange for follow-up.  Return to the ER for new, worsening, or persistent severe chest pain, difficulty breathing, weakness, or any other new or worsening symptoms that concern you.

## 2019-04-06 NOTE — ED Provider Notes (Signed)
[facility] Emergency Department Provider Note ____________________________________________   First MD Initiated Contact with Patient 04/06/19 1202     (approximate)  I have reviewed the triage vital signs and the nursing notes.   HISTORY  Chief Complaint Chest Pain    HPI [name] is a 84 y.o. male with PMH as noted below including a history of CAD and CHF who presents with chest pain, 2 episodes since last night, substernal, and now resolved.  The patient initially had chest pain last night and took 2 nitroglycerin and and it went away.  EMS was called to the house, but after the pain resolved he refused transport.  This morning around 10 AM he had a milder version of this pain and it resolved with 1 nitroglycerin.  However, a visiting nurse convinced him to come to the hospital to be checked out.  He has had similar chest pain in the past.  He states he now feels fine, and denies shortness of breath, nausea, lightheadedness, or other acute symptoms.  Past Medical History:  Diagnosis Date  . Arthritis   . Atrial fibrillation ([place])   . Carcinoma of prostate ([place])   . CHF (congestive heart failure) ([place])   . Chronic kidney disease   . Diabetes mellitus without complication ([place])   . ED (erectile dysfunction)   . Frequent urination   . Hyperlipidemia   . Hypertension   . Moderate mitral insufficiency   . Peripheral vascular disease ([place])   . Pneumonia 04/2016  . Prostate cancer ([place])   . Sleep apnea    OSA--USE C-PAP  . Ulcer of left foot due to type 2 diabetes mellitus ([employer])   . Urinary stress incontinence, male     Patient Active Problem List   Diagnosis Date Noted  . Hx of BKA, right ([place]) 03/02/2019  . Severe protein-calorie malnutrition ([place]) 02/16/2019  . Acute systolic CHF (congestive heart failure) ([place]) 02/16/2019  . Acute respiratory failure with hypoxia ([place]) 02/08/2019  . Pneumonia due to COVID-19 virus 02/08/2019  .  Below-knee amputation of right lower extremity ([place]) 01/15/2019  . Polyp of descending colon   . Iron deficiency anemia due to chronic blood loss   . Sacral decubitus ulcer, stage II ([place]) 12/26/2018  . Acute respiratory failure ([place]) 12/20/2018  . Femur fracture ([place]) 10/13/2018  . Chest pain 06/03/2018  . NSTEMI (non-ST elevated myocardial infarction) ([place]) 01/16/2018  . Acute CHF (congestive heart failure) ([place]) 11/23/2017  . Atrial fibrillation with rapid ventricular response ([place]) 10/15/2017  . BKA stump complication ([place]) 0000000  . 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 ([place]) 02/10/2017  . S/P BKA (below knee amputation) unilateral, left ([place]) 02/01/2017  . Chronic diastolic heart failure ([place]) 01/21/2017  . Pressure injury of skin 01/18/2017  . Atherosclerosis of artery of extremity with gangrene ([place]) 01/05/2017  . Diabetic foot ulcer ([place]) 12/29/2016  . Ataxia 10/21/2016  . Atherosclerosis of native arteries of the extremities with ulceration ([place]) 10/12/2016  . History of femoral angiogram 09/06/2016  . Left leg pain 09/06/2016  . Leukocytosis 09/06/2016  . Generalized weakness 09/06/2016  . Atrial fibrillation, chronic ([place]) 08/30/2016  . Pneumonia 05/19/2016  . Hyperlipidemia 04/20/2016  . Back pain 04/19/2016  . Type 2 diabetes mellitus treated with insulin ([place]) 04/19/2016  . Essential hypertension 04/19/2016  . PAD (peripheral artery disease) ([place]) 04/19/2016  . Erectile dysfunction following radical prostatectomy 06/25/2015  . History of prostate cancer 12/23/2014  . Incontinence 12/23/2014  Past Surgical History:  Procedure Laterality Date  . AMPUTATION Left 01/12/2017   Procedure: AMPUTATION BELOW KNEE;  Surgeon: Algernon Huxley, MD;  Location: [facility] ORS;  Service: General;  Laterality: Left;  . AMPUTATION Right 12/27/2018   Procedure: AMPUTATION BELOW KNEE;  Surgeon: Algernon Huxley, MD;  Location: [facility] ORS;  Service: General;  Laterality: Right;   . APLIGRAFT PLACEMENT Left 10/15/2016   Procedure: APLIGRAFT PLACEMENT;  Surgeon: Albertine Patricia, DPM;  Location: [facility] ORS;  Service: Podiatry;  Laterality: Left;  necrotic ulcer  . CHOLECYSTECTOMY    . COLONOSCOPY WITH PROPOFOL N/A 01/02/2019   Procedure: COLONOSCOPY WITH PROPOFOL;  Surgeon: Lucilla Lame, MD;  Location: [facility] ENDOSCOPY;  Service: Endoscopy;  Laterality: N/A;  . COLONOSCOPY WITH PROPOFOL N/A 01/05/2019   Procedure: COLONOSCOPY WITH PROPOFOL;  Surgeon: Lucilla Lame, MD;  Location: [facility] ENDOSCOPY;  Service: Endoscopy;  Laterality: N/A;  . ESOPHAGOGASTRODUODENOSCOPY (EGD) WITH PROPOFOL N/A 01/02/2019   Procedure: ESOPHAGOGASTRODUODENOSCOPY (EGD) WITH PROPOFOL;  Surgeon: Lucilla Lame, MD;  Location: [facility] ENDOSCOPY;  Service: Endoscopy;  Laterality: N/A;  . EYE SURGERY Bilateral    Cataract Extraction with IOL  . HIP ARTHROPLASTY Right 10/13/2018   Procedure: ARTHROPLASTY BIPOLAR HIP (HEMIARTHROPLASTY);  Surgeon: Earnestine Leys, MD;  Location: [facility] ORS;  Service: Orthopedics;  Laterality: Right;  . IRRIGATION AND DEBRIDEMENT FOOT Left 10/15/2016   Procedure: IRRIGATION AND DEBRIDEMENT FOOT-EXCISIONAL DEBRIDEMENT OF SKIN 3RD, 4TH AND 5TH TOES WITH APPLICATION OF APLIGRAFT;  Surgeon: Albertine Patricia, DPM;  Location: [facility] ORS;  Service: Podiatry;  Laterality: Left;  necrotic,gangrene  . IRRIGATION AND DEBRIDEMENT FOOT Left 12/09/2016   Procedure: IRRIGATION AND DEBRIDEMENT FOOT-MUSCLE/FASCIA, RESECTION OF FOURTH AND FIFTH METATARSAL NECROTIC BONE;  Surgeon: Albertine Patricia, DPM;  Location: [facility] ORS;  Service: Podiatry;  Laterality: Left;  . IRRIGATION AND DEBRIDEMENT FOOT Right 12/23/2018   Procedure: IRRIGATION AND DEBRIDEMENT FOOT and wound vac application;  Surgeon: Samara Deist, DPM;  Location: [facility] ORS;  Service: Podiatry;  Laterality: Right;  . LEFT HEART CATH AND CORONARY ANGIOGRAPHY N/A 06/05/2018   Procedure: LEFT HEART CATH AND CORONARY ANGIOGRAPHY;  Surgeon: Yolonda Kida,  MD;  Location: [place] CV LAB;  Service: Cardiovascular;  Laterality: N/A;  . LOWER EXTREMITY ANGIOGRAPHY Left 08/16/2016   Procedure: Lower Extremity Angiography;  Surgeon: Algernon Huxley, MD;  Location: [place] CV LAB;  Service: Cardiovascular;  Laterality: Left;  . LOWER EXTREMITY ANGIOGRAPHY Left 09/01/2016   Procedure: Lower Extremity Angiography;  Surgeon: Algernon Huxley, MD;  Location: [place] [facility] LAB;  Service: Cardiovascular;  Laterality: Left;  . LOWER EXTREMITY ANGIOGRAPHY Left 10/14/2016   Procedure: Lower Extremity Angiography;  Surgeon: Algernon Huxley, MD;  Location: [place] CV LAB;  Service: Cardiovascular;  Laterality: Left;  . LOWER EXTREMITY ANGIOGRAPHY Left 12/20/2016   Procedure: Lower Extremity Angiography;  Surgeon: Algernon Huxley, MD;  Location: [place] CV LAB;  Service: Cardiovascular;  Laterality: Left;  . LOWER EXTREMITY ANGIOGRAPHY Right 12/18/2018   Procedure: LOWER EXTREMITY ANGIOGRAPHY;  Surgeon: Algernon Huxley, MD;  Location: [place] [facility] LAB;  Service: Cardiovascular;  Laterality: Right;  . LOWER EXTREMITY INTERVENTION  09/01/2016   Procedure: Lower Extremity Intervention;  Surgeon: Algernon Huxley, MD;  Location: [place] CV LAB;  Service: Cardiovascular;;  . PROSTATE SURGERY     removal  . TONSILLECTOMY     as a child    Prior to Admission medications   Medication Sig Start Date End Date Taking? Authorizing Provider  acetaminophen (TYLENOL) 325  MG tablet Take 650 mg by mouth every 6 (six) hours as needed.    [provider]  amiodarone (PACERONE) 200 MG tablet Take 200 mg by mouth daily. 09/13/18   [provider]  aspirin EC 81 MG tablet Take 1 tablet (81 mg total) by mouth daily. 12/18/18   Algernon Huxley, MD  atorvastatin (LIPITOR) 80 MG tablet Take 80 mg by mouth every evening. 07/31/18   [provider]  bisacodyl (DULCOLAX) 5 MG EC tablet Take 1 tablet (5 mg total) by mouth daily as needed for moderate constipation.  01/05/19   [name], MD  chlorthalidone (HYGROTON) 25 MG tablet Take 25 mg by mouth daily.    [provider]  Cholecalciferol (VITAMIN D-3) 25 MCG (1000 UT) CAPS Take by mouth.    [provider]  diltiazem (CARDIZEM CD) 120 MG 24 hr capsule Take 120 mg by mouth daily. 09/28/18   [provider]  divalproex (DEPAKOTE ER) 500 MG 24 hr tablet Take 1 tablet (500 mg total) by mouth daily. 02/16/19   Charlynne Cousins, MD  doxycycline (VIBRAMYCIN) 100 MG capsule Take 100 mg by mouth 2 (two) times daily.    [provider]  Ensure Max Protein (ENSURE MAX PROTEIN) LIQD Take 330 mLs (11 oz total) by mouth 2 (two) times daily. 01/05/19   [name], MD  ferrous sulfate 325 (65 FE) MG tablet Take 1 tablet (325 mg total) by mouth daily with breakfast. 10/18/18   [name] Mandes, MD  HYDROcodone-acetaminophen (NORCO/VICODIN) 5-325 MG tablet Take 1 tablet by mouth every 6 (six) hours as needed for moderate pain (pain score 4-6). Patient not taking: Reported on 03/23/2019 02/19/19   Charlynne Cousins, MD  insulin glargine (LANTUS) 100 UNIT/ML injection Inject 0.15 mLs (15 Units total) into the skin at bedtime. 10/18/18   [name] Mandes, MD  insulin lispro (HUMALOG) 100 UNIT/ML injection Inject 0.03 mLs (3 Units total) into the skin 3 (three) times daily with meals. Patient taking differently: Inject 3 Units into the skin 3 (three) times daily with meals. Plus 1 additional unit for every 50 BG points over 200 (max 5 additional units per dose) 11/28/17   Loletha Grayer, MD  isosorbide mononitrate (IMDUR) 60 MG 24 hr tablet Take 1 tablet (60 mg total) by mouth daily. 04/06/19   Arta Silence, MD  metFORMIN (GLUCOPHAGE) 500 MG tablet Take by mouth daily.    [provider]  metoprolol succinate (TOPROL-XL) 25 MG 24 hr tablet Take 25 mg by mouth daily.    [provider]  Multiple Vitamin (MULTIVITAMIN WITH MINERALS) TABS tablet Take 1 tablet by mouth daily. 01/05/19    [name], MD  nitroGLYCERIN (NITROSTAT) 0.4 MG SL tablet Place 1 tablet (0.4 mg total) under the tongue every 5 (five) minutes as needed for chest pain. 06/09/18   Hillary Bow, MD  potassium chloride (KLOR-CON) 20 MEQ packet Take by mouth 2 (two) times daily.    [provider]  Rivaroxaban (XARELTO) 15 MG TABS tablet Take 1 tablet (15 mg total) by mouth daily with supper. Hold Xarelto for 3 days.  Start on January 09, 2019. 01/09/19   [name], MD  senna (SENOKOT) 8.6 MG TABS tablet Take 1 tablet (8.6 mg total) by mouth daily as needed for mild constipation. 01/05/19   [name], MD  silver sulfADIAZINE (SILVADENE) 1 % cream Apply 1 application topically daily. 01/30/19   Kris Hartmann, NP  vitamin C (VITAMIN C) 250  MG tablet Take 1 tablet (250 mg total) by mouth 2 (two) times daily. Patient taking differently: Take 1,000 mg by mouth daily.  01/05/19   [name], MD  zinc sulfate 220 (50 Zn) MG capsule Take 220 mg by mouth daily.    [provider]    Allergies Contrast media [iodinated diagnostic agents], Iohexol, Metrizamide, and Latex  Family History  Problem Relation Age of Onset  . Hypertension Mother   . Diabetes Mother   . Prostate cancer Neg Hx   . Bladder Cancer Neg Hx   . Kidney disease Neg Hx     Social History Social History   Tobacco Use  . Smoking status: Former Smoker    Packs/day: 0.25    Types: Cigarettes    Quit date: 10/14/1994    Years since quitting: 24.4  . Smokeless tobacco: Never Used  Substance Use Topics  . Alcohol use: No    Alcohol/week: 0.0 standard drinks  . Drug use: No    Review of Systems  Constitutional: No fever. Eyes: No redness. ENT: No sore throat. Cardiovascular: Positive for resolved chest pain. Respiratory: Denies shortness of breath. Gastrointestinal: No vomiting or diarrhea.  Genitourinary: Negative for flank pain.  Musculoskeletal: Negative for back pain. Skin: Negative for rash. Neurological:  Negative for headache.   ____________________________________________   PHYSICAL EXAM:  VITAL SIGNS: ED Triage Vitals [04/06/19 1211]  Enc Vitals Group     BP (!) 143/66     Pulse Rate (!) 58     Resp (!) 24     Temp 98.1 F (36.7 C)     Temp Source Oral     SpO2 94 %     Weight 165 lb (74.8 kg)     Height 5\' 7"  (1.702 m)     Head Circumference      Peak Flow      Pain Score 0     Pain Loc      Pain Edu?      Excl. in [place]?     Constitutional: Alert and oriented.  Relatively well appearing and in no acute distress. Eyes: Conjunctivae are normal.  Head: Atraumatic. Nose: No congestion/rhinnorhea. Mouth/Throat: Mucous membranes are moist.   Neck: Normal range of motion.  Cardiovascular: Normal rate, regular rhythm. Grossly normal heart sounds.  Good peripheral circulation. Respiratory: Normal respiratory effort.  No retractions. Lungs CTAB. Gastrointestinal:  No distention.  Musculoskeletal: No lower extremity edema.  Extremities warm and well perfused.  Neurologic:  Normal speech and language. No gross focal neurologic deficits are appreciated.  Skin:  Skin is warm and dry. No rash noted. Psychiatric: Mood and affect are normal. Speech and behavior are normal.  ____________________________________________   LABS (all labs ordered are listed, but only abnormal results are displayed)  Labs Reviewed  BASIC METABOLIC PANEL - Abnormal; Notable for the following components:      Result Value   Glucose, Bld 323 (*)    BUN 31 (*)    Calcium 8.1 (*)    All other components within normal limits  CBC WITH DIFFERENTIAL/PLATELET - Abnormal; Notable for the following components:   RBC 3.64 (*)    Hemoglobin 10.3 (*)    HCT 31.2 (*)    RDW 17.1 (*)    Eosinophils Absolute 0.7 (*)    All other components within normal limits  TROPONIN I (HIGH SENSITIVITY) - Abnormal; Notable for the following components:   Troponin I (High Sensitivity) 37 (*)    All  other components within  normal limits  TROPONIN I (HIGH SENSITIVITY) - Abnormal; Notable for the following components:   Troponin I (High Sensitivity) 46 (*)    All other components within normal limits   ____________________________________________  EKG  ED ECG REPORT I, Arta Silence, the attending physician, personally viewed and interpreted this ECG.  Date: 04/06/2019 EKG Time: 1209 Rate: 57 Rhythm: normal sinus rhythm QRS Axis: normal Intervals: normal ST/T Wave abnormalities: normal Narrative Interpretation: no evidence of acute ischemia  ____________________________________________  RADIOLOGY  CXR: Cardiac enlargement with pulmonary vascular congestion.  No focal infiltrate or acute edema.  ____________________________________________   PROCEDURES  Procedure(s) performed: No  Procedures  Critical Care performed: No ____________________________________________   INITIAL IMPRESSION / ASSESSMENT AND PLAN / ED COURSE  Pertinent labs & imaging results that were available during my care of the patient were reviewed by me and considered in my medical decision making (see chart for details).  84 year old male with PMH as noted above including a history of CAD presents with 2 episodes of chest pain, both relieved with nitroglycerin.  The patient had an episode last night but refused EMS transport, and a milder episode this morning.  He is now asymptomatic.  He had a cardiac catheterization in January of this year showing multivessel disease but he did not receive intervention.  He follows with Dr. Nehemiah Massed from cardiology and last saw him on 11/8.  He is being medically managed with Imdur, beta-blocker, and calcium channel blocker for anginal symptoms.  On exam, the patient is very well-appearing for his age.  His vital signs are normal.  The remainder of the physical exam is unremarkable.  EKG is nonischemic.  Overall I suspect most likely exacerbation of the patient's chronic anginal  symptoms.  I have a low suspicion for ACS on this presentation although the patient is at elevated risk.  We will obtain cardiac enzymes x2.  I anticipate that if they are negative and the patient remains asymptomatic he likely will be appropriate for discharge home.  ----------------------------------------- 3:23 PM on 04/06/2019 -----------------------------------------  Initial troponin is 37, and the repeat is not significantly changed.  The patient continues to be chest pain-free.  I consulted Dr. Ubaldo Glassing from cardiology and discussed the case with him.  He advised that based on the patient's chronic history, given the resolved pain and no significant increase in the troponin, he is appropriate for discharge home with outpatient follow-up.  He recommended that we increase the patient's Imdur from 30 mg to 60 mg daily.  On reassessment, the patient is comfortable and would like to go home.  I discussed the results of the work-up and the plan of care with him and he agrees.  I gave him thorough return precautions and he expresses understanding.  ____________________________________________   FINAL CLINICAL IMPRESSION(S) / ED DIAGNOSES  Final diagnoses:  Chest pain, unspecified type      NEW MEDICATIONS STARTED DURING THIS VISIT:  New Prescriptions   ISOSORBIDE MONONITRATE (IMDUR) 60 MG 24 HR TABLET    Take 1 tablet (60 mg total) by mouth daily.     Note:  This document was prepared using Dragon voice recognition software and may include unintentional dictation errors.    Arta Silence, MD 04/06/19 1524

## 2019-04-06 NOTE — Telephone Encounter (Signed)
Home health nurse called to informed since her visit on Wednesday the patient right stump has gotten worst. When she arrived the patient dressing was barely on, there is moderate green drainage, new open area with blood drainage,and the scarce has increase in size. Also Lattie Haw from OT is requesting verbal orders for couple visits for four weeks. Dr Lucky Cowboy is fine with OT visits and I have made him aware with information

## 2019-04-06 NOTE — ED Triage Notes (Signed)
Pt via EMS from home c/o CP. Pt called EMS last night for the same CP, he took 2 Nitro and pain resolved and refused transport. Pt called EMS again today for the same CP at 1030, pt took 1 Nitro. Pt given 324 ASA en route

## 2019-04-06 NOTE — ED Notes (Signed)
Pt and Pt's daughter verbalized understanding of discharge instructions. NAD at this time. 

## 2019-04-06 NOTE — ED Notes (Signed)
This RN spoke with pt's wife about transportation home upon pt's discharge. [name] states she is arranging a ride for him. This RN also spoke with pt's son who called for an update and he is aware of pt's pending discharge.

## 2019-04-13 ENCOUNTER — Ambulatory Visit (INDEPENDENT_AMBULATORY_CARE_PROVIDER_SITE_OTHER): Payer: Medicare Other | Admitting: Nurse Practitioner

## 2019-04-13 ENCOUNTER — Other Ambulatory Visit: Payer: Self-pay

## 2019-04-13 ENCOUNTER — Encounter (INDEPENDENT_AMBULATORY_CARE_PROVIDER_SITE_OTHER): Payer: Self-pay | Admitting: Nurse Practitioner

## 2019-04-13 VITALS — BP 176/75 | HR 61 | Resp 16 | Ht 67.0 in | Wt 160.0 lb

## 2019-04-13 DIAGNOSIS — S88111A Complete traumatic amputation at level between knee and ankle, right lower leg, initial encounter: Secondary | ICD-10-CM

## 2019-04-13 DIAGNOSIS — Z794 Long term (current) use of insulin: Secondary | ICD-10-CM

## 2019-04-13 DIAGNOSIS — I10 Essential (primary) hypertension: Secondary | ICD-10-CM | POA: Diagnosis not present

## 2019-04-13 DIAGNOSIS — E119 Type 2 diabetes mellitus without complications: Secondary | ICD-10-CM | POA: Diagnosis not present

## 2019-04-15 ENCOUNTER — Encounter (INDEPENDENT_AMBULATORY_CARE_PROVIDER_SITE_OTHER): Payer: Self-pay | Admitting: Nurse Practitioner

## 2019-04-15 NOTE — Progress Notes (Signed)
SUBJECTIVE:  Patient ID: Baron Steinback, male    DOB: 1934/10/03, 84 y.o.   MRN: [id number] Chief Complaint  Patient presents with  . Follow-up    3 week f/u    HPI  Katrell Subler is a 84 y.o. male that presents today to evaluate the progress of healing on his right below-knee amputation stump.  The patient is improving in his ability to keep his knee straight.  The patient's wound has a beefy red appearance to his place with a little fibrinous exudate near the edges.  There are small parts and pieces of eschar.  The patient states that he is been getting wet-to-dry wounds 3 times a week done by his home health nurse.  He denies any fever, chills, nausea, vomiting or diarrhea.  He denies any chest pain or shortness of breath.  Past Medical History:  Diagnosis Date  . Arthritis   . Atrial fibrillation ([place])   . Carcinoma of prostate ([place])   . CHF (congestive heart failure) ([place])   . Chronic kidney disease   . Diabetes mellitus without complication ([place])   . ED (erectile dysfunction)   . Frequent urination   . Hyperlipidemia   . Hypertension   . Moderate mitral insufficiency   . Peripheral vascular disease ([place])   . Pneumonia 04/2016  . Prostate cancer ([place])   . Sleep apnea    OSA--USE C-PAP  . Ulcer of left foot due to type 2 diabetes mellitus ([place])   . Urinary stress incontinence, male     Past Surgical History:  Procedure Laterality Date  . AMPUTATION Left 01/12/2017   Procedure: AMPUTATION BELOW KNEE;  Surgeon: Algernon Huxley, MD;  Location: [facility] ORS;  Service: General;  Laterality: Left;  . AMPUTATION Right 12/27/2018   Procedure: AMPUTATION BELOW KNEE;  Surgeon: Algernon Huxley, MD;  Location: [facility]S;  Service: General;  Laterality: Right;  . APLIGRAFT PLACEMENT Left 10/15/2016   Procedure: APLIGRAFT PLACEMENT;  Surgeon: Albertine Patricia, DPM;  Location: [facility];  Service: Podiatry;  Laterality: Left;  necrotic ulcer  . CHOLECYSTECTOMY    . COLONOSCOPY WITH  PROPOFOL N/A 01/02/2019   Procedure: COLONOSCOPY WITH PROPOFOL;  Surgeon: Lucilla Lame, MD;  Location: [facility] ENDOSCOPY;  Service: Endoscopy;  Laterality: N/A;  . COLONOSCOPY WITH PROPOFOL N/A 01/05/2019   Procedure: COLONOSCOPY WITH PROPOFOL;  Surgeon: Lucilla Lame, MD;  Location: [facility] ENDOSCOPY;  Service: Endoscopy;  Laterality: N/A;  . ESOPHAGOGASTRODUODENOSCOPY (EGD) WITH PROPOFOL N/A 01/02/2019   Procedure: ESOPHAGOGASTRODUODENOSCOPY (EGD) WITH PROPOFOL;  Surgeon: Lucilla Lame, MD;  Location: [facility] ENDOSCOPY;  Service: Endoscopy;  Laterality: N/A;  . EYE SURGERY Bilateral    Cataract Extraction with IOL  . HIP ARTHROPLASTY Right 10/13/2018   Procedure: ARTHROPLASTY BIPOLAR HIP (HEMIARTHROPLASTY);  Surgeon: Earnestine Leys, MD;  Location: [facility] ORS;  Service: Orthopedics;  Laterality: Right;  . IRRIGATION AND DEBRIDEMENT FOOT Left 10/15/2016   Procedure: IRRIGATION AND DEBRIDEMENT FOOT-EXCISIONAL DEBRIDEMENT OF SKIN 3RD, 4TH AND 5TH TOES WITH APPLICATION OF APLIGRAFT;  Surgeon: Albertine Patricia, DPM;  Location: [facility]S;  Service: Podiatry;  Laterality: Left;  necrotic,gangrene  . IRRIGATION AND DEBRIDEMENT FOOT Left 12/09/2016   Procedure: IRRIGATION AND DEBRIDEMENT FOOT-MUSCLE/FASCIA, RESECTION OF FOURTH AND FIFTH METATARSAL NECROTIC BONE;  Surgeon: Albertine Patricia, DPM;  Location: [facility];  Service: Podiatry;  Laterality: Left;  . IRRIGATION AND DEBRIDEMENT FOOT Right 12/23/2018   Procedure: IRRIGATION AND DEBRIDEMENT FOOT and wound vac application;  Surgeon: Samara Deist, DPM;  Location: [facility];  Service: Podiatry;  Laterality: Right;  . LEFT HEART CATH AND CORONARY ANGIOGRAPHY N/A 06/05/2018   Procedure: LEFT HEART CATH AND CORONARY ANGIOGRAPHY;  Surgeon: Yolonda Kida, MD;  Location: [place] [facility];  Service: Cardiovascular;  Laterality: N/A;  . LOWER EXTREMITY ANGIOGRAPHY Left 08/16/2016   Procedure: Lower Extremity Angiography;  Surgeon: Algernon Huxley, MD;  Location: [place] [facility];  Service: Cardiovascular;  Laterality: Left;  . LOWER EXTREMITY ANGIOGRAPHY Left 09/01/2016   Procedure: Lower Extremity Angiography;  Surgeon: Algernon Huxley, MD;  Location: [place] [facility];  Service: Cardiovascular;  Laterality: Left;  . LOWER EXTREMITY ANGIOGRAPHY Left 10/14/2016   Procedure: Lower Extremity Angiography;  Surgeon: Algernon Huxley, MD;  Location: [place] [facility];  Service: Cardiovascular;  Laterality: Left;  . LOWER EXTREMITY ANGIOGRAPHY Left 12/20/2016   Procedure: Lower Extremity Angiography;  Surgeon: Algernon Huxley, MD;  Location: [place] [facility];  Service: Cardiovascular;  Laterality: Left;  . LOWER EXTREMITY ANGIOGRAPHY Right 12/18/2018   Procedure: LOWER EXTREMITY ANGIOGRAPHY;  Surgeon: Algernon Huxley, MD;  Location: [place] [facility];  Service: Cardiovascular;  Laterality: Right;  . LOWER EXTREMITY INTERVENTION  09/01/2016   Procedure: Lower Extremity Intervention;  Surgeon: Algernon Huxley, MD;  Location: [place] [facility];  Service: Cardiovascular;;  . PROSTATE SURGERY     removal  . TONSILLECTOMY     as a child    Social History   Socioeconomic History  . Marital status: Married    Spouse name: Not on file  . Number of children: Not on file  . Years of education: Not on file  . Highest education level: Not on file  Occupational History  . Occupation: retired  Scientific laboratory technician  . Financial resource strain: Not on file  . Food insecurity    Worry: Not on file    Inability: Not on file  . Transportation needs    Medical: Not on file    Non-medical: Not on file  Tobacco Use  . Smoking status: Former Smoker    Packs/day: 0.25    Types: Cigarettes    Quit date: 10/14/1994    Years since quitting: 24.5  . Smokeless tobacco: Never Used  Substance and Sexual Activity  . Alcohol use: No    Alcohol/week: 0.0 standard drinks  . Drug use: No  . Sexual activity: Not on file  Lifestyle  . Physical activity    Days per week: Not on file    Minutes per  session: Not on file  . Stress: Not on file  Relationships  . Social Herbalist on phone: Not on file    Gets together: Not on file    Attends religious service: Not on file    Active member of club or organization: Not on file    Attends meetings of clubs or organizations: Not on file    Relationship status: Not on file  . Intimate partner violence    Fear of current or ex partner: Not on file    Emotionally abused: Not on file    Physically abused: Not on file    Forced sexual activity: Not on file  Other Topics Concern  . Not on file  Social History Narrative   Lives at home with wife, has a wheelchair    Family History  Problem Relation Age of Onset  . Hypertension Mother   . Diabetes Mother   . Prostate cancer Neg Hx   .  Bladder Cancer Neg Hx   . Kidney disease Neg Hx     Allergies  Allergen Reactions  . Contrast Media [Iodinated Diagnostic Agents] Shortness Of Breath    SOB  . Iohexol Shortness Of Breath     Desc: Respiratory Distress, Laryngedema, diaphoresis, Onset Date: [id number]   . Metrizamide Shortness Of Breath    SOB SOB SOB   . Latex Rash     Review of Systems   Review of Systems: Negative Unless Checked Constitutional: [] Weight loss  [] Fever  [] Chills Cardiac: [] Chest pain   []  Atrial Fibrillation  [] Palpitations   [] Shortness of breath when laying flat   [] Shortness of breath with exertion. [] Shortness of breath at rest Vascular:  [] Pain in legs with walking   [] Pain in legs with standing [] Pain in legs when laying flat   [] Claudication    [] Pain in feet when laying flat    [] History of DVT   [] Phlebitis   [] Swelling in legs   [] Varicose veins   [] Non-healing ulcers Pulmonary:   [] Uses home oxygen   [] Productive cough   [] Hemoptysis   [] Wheeze  [] COPD   [] Asthma Neurologic:  [] Dizziness   [] Seizures  [] Blackouts [] History of stroke   [] History of TIA  [] Aphasia   [] Temporary Blindness   [] Weakness or numbness in arm   [] Weakness or  numbness in leg Musculoskeletal:   [] Joint swelling   [] Joint pain   [] Low back pain  []  History of Knee Replacement [x] Arthritis [] back Surgeries  []  Spinal Stenosis    Hematologic:  [] Easy bruising  [] Easy bleeding   [] Hypercoagulable state   [x] Anemic Gastrointestinal:  [] Diarrhea   [] Vomiting  [] Gastroesophageal reflux/heartburn   [] Difficulty swallowing. [] Abdominal pain Genitourinary:  [x] Chronic kidney disease   [] Difficult urination  [] Anuric   [] Blood in urine [] Frequent urination  [] Burning with urination   [] Hematuria Skin:  [] Rashes   [] Ulcers [] Wounds Psychological:  [] History of anxiety   []  History of major depression  [x]  Memory Difficulties      OBJECTIVE:   Physical Exam  BP (!) 176/75 (BP Location: Left Arm)   Pulse 61   Resp 16   Ht 5\' 7"  (1.702 m)   Wt 160 lb (72.6 kg)   BMI 25.06 kg/m   Gen: WD/WN, NAD Head: [place]/AT, No temporalis wasting.  Ear/Nose/Throat: Hearing grossly intact, nares w/o erythema or drainage Eyes: PER, EOMI, sclera nonicteric.  Neck: Supple, no masses.  No JVD.  Pulmonary:  Good air movement, no use of accessory muscles.  Cardiac: RRR Vascular:  Vessel Right Left  Radial Palpable Palpable   Gastrointestinal: soft, non-distended. No guarding/no peritoneal signs.  Musculoskeletal: Bilateral below-knee amputations.  No deformity or atrophy.  Neurologic: Pain and light touch intact in extremities.  Symmetrical.  Speech is fluent. Motor exam as listed above. Psychiatric: Judgment intact, Mood & affect appropriate for pt's clinical situation. Dermatologic: No Venous rashes. No Ulcers Noted.  No changes consistent with cellulitis. Lymph : No Cervical lymphadenopathy, no lichenification or skin changes of chronic lymphedema.           ASSESSMENT AND PLAN:  1. Below-knee amputation of right lower extremity ([place]) The wound is continuing to heal, even if slowly.  We will try to transition to utilizing Aquacel on the wound.  We will continue  with very close follow-up and have him return in 3 weeks.  2. Type 2 diabetes mellitus treated with insulin ([place]) Continue hypoglycemic medications as already ordered, these medications have been reviewed and there are  no changes at this time.  Hgb A1C to be monitored as already arranged by primary service   3. Essential hypertension Continue antihypertensive medications as already ordered, these medications have been reviewed and there are no changes at this time.    Current Outpatient Medications on File Prior to Visit  Medication Sig Dispense Refill  . acetaminophen (TYLENOL) 325 MG tablet Take 650 mg by mouth every 6 (six) hours as needed.    Marland Kitchen amiodarone (PACERONE) 200 MG tablet Take 200 mg by mouth daily.    Marland Kitchen aspirin EC 81 MG tablet Take 1 tablet (81 mg total) by mouth daily. 150 tablet 2  . atorvastatin (LIPITOR) 80 MG tablet Take 80 mg by mouth every evening.    . bisacodyl (DULCOLAX) 5 MG EC tablet Take 1 tablet (5 mg total) by mouth daily as needed for moderate constipation. 30 tablet 0  . chlorthalidone (HYGROTON) 25 MG tablet Take 25 mg by mouth daily.    . Cholecalciferol (VITAMIN D-3) 25 MCG (1000 UT) CAPS Take by mouth.    . diltiazem (CARDIZEM CD) 120 MG 24 hr capsule Take 120 mg by mouth daily.    . divalproex (DEPAKOTE ER) 500 MG 24 hr tablet Take 1 tablet (500 mg total) by mouth daily.    . Ensure Max Protein (ENSURE MAX PROTEIN) LIQD Take 330 mLs (11 oz total) by mouth 2 (two) times daily.    Marland Kitchen gabapentin (NEURONTIN) 300 MG capsule Take by mouth.    Marland Kitchen HYDROcodone-acetaminophen (NORCO/VICODIN) 5-325 MG tablet Take 1 tablet by mouth every 6 (six) hours as needed for moderate pain (pain score 4-6). 10 tablet 0  . insulin glargine (LANTUS) 100 UNIT/ML injection Inject 0.15 mLs (15 Units total) into the skin at bedtime. 1 vial 0  . insulin lispro (HUMALOG) 100 UNIT/ML injection Inject 0.03 mLs (3 Units total) into the skin 3 (three) times daily with meals. (Patient taking  differently: Inject 3 Units into the skin 3 (three) times daily with meals. Plus 1 additional unit for every 50 BG points over 200 (max 5 additional units per dose)) 10 mL 0  . isosorbide mononitrate (IMDUR) 60 MG 24 hr tablet Take 1 tablet (60 mg total) by mouth daily. 30 tablet 0  . metFORMIN (GLUCOPHAGE) 500 MG tablet Take by mouth daily.    . metoprolol succinate (TOPROL-XL) 25 MG 24 hr tablet Take 25 mg by mouth daily.    . metoprolol tartrate (LOPRESSOR) 25 MG tablet Take 25 mg by mouth 3 (three) times daily.    . Multiple Vitamin (MULTIVITAMIN WITH MINERALS) TABS tablet Take 1 tablet by mouth daily.    . nitroGLYCERIN (NITROSTAT) 0.4 MG SL tablet Place 1 tablet (0.4 mg total) under the tongue every 5 (five) minutes as needed for chest pain. 20 tablet 0  . potassium chloride (KLOR-CON) 20 MEQ packet Take by mouth 2 (two) times daily.    . Rivaroxaban (XARELTO) 15 MG TABS tablet Take 1 tablet (15 mg total) by mouth daily with supper. Hold Xarelto for 3 days.  Start on January 09, 2019. 30 tablet 1  . senna (SENOKOT) 8.6 MG TABS tablet Take 1 tablet (8.6 mg total) by mouth daily as needed for mild constipation. 120 tablet 0  . silver sulfADIAZINE (SILVADENE) 1 % cream Apply 1 application topically daily. 50 g 0  . vitamin C (VITAMIN C) 250 MG tablet Take 1 tablet (250 mg total) by mouth 2 (two) times daily. (Patient taking differently: Take 1,000 mg  by mouth daily. )    . zinc sulfate 220 (50 Zn) MG capsule Take 220 mg by mouth daily.    Marland Kitchen doxycycline (VIBRAMYCIN) 100 MG capsule Take 100 mg by mouth 2 (two) times daily.    . ferrous sulfate 325 (65 FE) MG tablet Take 1 tablet (325 mg total) by mouth daily with breakfast. (Patient not taking: Reported on 04/13/2019) 30 tablet 3   Current Facility-Administered Medications on File Prior to Visit  Medication Dose Route Frequency Provider Last Rate Last Dose  . diphenhydrAMINE (BENADRYL) injection 50 mg  50 mg Intravenous Once PRN Stegmayer, Kimberly  A, PA-C      . famotidine (PEPCID) tablet 40 mg  40 mg Oral Once PRN Stegmayer, Janalyn Harder, PA-C        There are no Patient Instructions on file for this visit. No follow-ups on file.   Kris Hartmann, NP  This note was completed with Sales executive.  Any errors are purely unintentional.

## 2019-04-16 ENCOUNTER — Other Ambulatory Visit (INDEPENDENT_AMBULATORY_CARE_PROVIDER_SITE_OTHER): Payer: Self-pay | Admitting: Nurse Practitioner

## 2019-04-16 ENCOUNTER — Telehealth (INDEPENDENT_AMBULATORY_CARE_PROVIDER_SITE_OTHER): Payer: Self-pay

## 2019-04-16 DIAGNOSIS — S88111A Complete traumatic amputation at level between knee and ankle, right lower leg, initial encounter: Secondary | ICD-10-CM

## 2019-04-16 MED ORDER — COLLAGENASE 250 UNIT/GM EX OINT: 1.0000 "application " | TOPICAL_OINTMENT | Freq: Every day | CUTANEOUS | 1 refills | Status: DC

## 2019-04-16 NOTE — Telephone Encounter (Signed)
Sure, we will send that in

## 2019-04-16 NOTE — Telephone Encounter (Signed)
Home health nurse has been made aware that's its fine to start new wound care

## 2019-05-08 ENCOUNTER — Encounter (INDEPENDENT_AMBULATORY_CARE_PROVIDER_SITE_OTHER): Payer: Self-pay | Admitting: Vascular Surgery

## 2019-05-08 ENCOUNTER — Other Ambulatory Visit: Payer: Self-pay

## 2019-05-08 ENCOUNTER — Ambulatory Visit (INDEPENDENT_AMBULATORY_CARE_PROVIDER_SITE_OTHER): Payer: Medicare Other | Admitting: Vascular Surgery

## 2019-05-08 VITALS — BP 196/63 | HR 97 | Resp 16

## 2019-05-08 DIAGNOSIS — Z89511 Acquired absence of right leg below knee: Secondary | ICD-10-CM

## 2019-05-08 DIAGNOSIS — Z794 Long term (current) use of insulin: Secondary | ICD-10-CM | POA: Diagnosis not present

## 2019-05-08 DIAGNOSIS — E119 Type 2 diabetes mellitus without complications: Secondary | ICD-10-CM | POA: Diagnosis not present

## 2019-05-08 DIAGNOSIS — I10 Essential (primary) hypertension: Secondary | ICD-10-CM

## 2019-05-08 NOTE — Assessment & Plan Note (Signed)
blood glucose control important in reducing the progression of atherosclerotic disease. Also, involved in wound healing. On appropriate medications.  

## 2019-05-08 NOTE — Progress Notes (Signed)
MRN : [id number]  [name] is a 84 y.o. (May 24, 1935) male who presents with chief complaint of  Chief Complaint  Patient presents with  . Follow-up    3week wound check  .  History of Present Illness: Patient returns today in follow up of his right below-knee amputation wound.  This has significantly improved since I last saw it.  There is no more fibrinous exudate or eschar.  There is good, pink and red granulation tissue with no erythema and minimal drainage.  He is also doing a good job of keeping his right knee straight.  Current Outpatient Medications  Medication Sig Dispense Refill  . acetaminophen (TYLENOL) 325 MG tablet Take 650 mg by mouth every 6 (six) hours as needed.    Marland Kitchen amiodarone (PACERONE) 200 MG tablet Take 200 mg by mouth daily.    Marland Kitchen aspirin EC 81 MG tablet Take 1 tablet (81 mg total) by mouth daily. 150 tablet 2  . atorvastatin (LIPITOR) 80 MG tablet Take 80 mg by mouth every evening.    . bisacodyl (DULCOLAX) 5 MG EC tablet Take 1 tablet (5 mg total) by mouth daily as needed for moderate constipation. 30 tablet 0  . chlorthalidone (HYGROTON) 25 MG tablet Take 25 mg by mouth daily.    . Cholecalciferol (VITAMIN D-3) 25 MCG (1000 UT) CAPS Take by mouth.    . collagenase (SANTYL) ointment Apply 1 application topically daily. 30 g 1  . diltiazem (CARDIZEM CD) 120 MG 24 hr capsule Take 120 mg by mouth daily.    . divalproex (DEPAKOTE ER) 500 MG 24 hr tablet Take 1 tablet (500 mg total) by mouth daily.    Marland Kitchen doxycycline (VIBRAMYCIN) 100 MG capsule Take 100 mg by mouth 2 (two) times daily.    . Ensure Max Protein (ENSURE MAX PROTEIN) LIQD Take 330 mLs (11 oz total) by mouth 2 (two) times daily.    Marland Kitchen gabapentin (NEURONTIN) 300 MG capsule Take by mouth.    Marland Kitchen HYDROcodone-acetaminophen (NORCO/VICODIN) 5-325 MG tablet Take 1 tablet by mouth every 6 (six) hours as needed for moderate pain (pain score 4-6). 10 tablet 0  . insulin glargine (LANTUS) 100 UNIT/ML  injection Inject 0.15 mLs (15 Units total) into the skin at bedtime. 1 vial 0  . insulin lispro (HUMALOG) 100 UNIT/ML injection Inject 0.03 mLs (3 Units total) into the skin 3 (three) times daily with meals. (Patient taking differently: Inject 3 Units into the skin 3 (three) times daily with meals. Plus 1 additional unit for every 50 BG points over 200 (max 5 additional units per dose)) 10 mL 0  . isosorbide mononitrate (IMDUR) 60 MG 24 hr tablet Take 1 tablet (60 mg total) by mouth daily. 30 tablet 0  . metFORMIN (GLUCOPHAGE) 500 MG tablet Take by mouth daily.    . metoprolol succinate (TOPROL-XL) 25 MG 24 hr tablet Take 25 mg by mouth daily.    . metoprolol tartrate (LOPRESSOR) 25 MG tablet Take 25 mg by mouth 3 (three) times daily.    . Multiple Vitamin (MULTIVITAMIN WITH MINERALS) TABS tablet Take 1 tablet by mouth daily.    . nitroGLYCERIN (NITROSTAT) 0.4 MG SL tablet Place 1 tablet (0.4 mg total) under the tongue every 5 (five) minutes as needed for chest pain. 20 tablet 0  . potassium chloride (KLOR-CON) 20 MEQ packet Take by mouth 2 (two) times daily.    . Rivaroxaban (XARELTO) 15 MG TABS tablet Take 1 tablet (15 mg total)  by mouth daily with supper. Hold Xarelto for 3 days.  Start on January 09, 2019. 30 tablet 1  . senna (SENOKOT) 8.6 MG TABS tablet Take 1 tablet (8.6 mg total) by mouth daily as needed for mild constipation. 120 tablet 0  . silver sulfADIAZINE (SILVADENE) 1 % cream Apply 1 application topically daily. 50 g 0  . vitamin C (VITAMIN C) 250 MG tablet Take 1 tablet (250 mg total) by mouth 2 (two) times daily. (Patient taking differently: Take 1,000 mg by mouth daily. )    . zinc sulfate 220 (50 Zn) MG capsule Take 220 mg by mouth daily.    . ferrous sulfate 325 (65 FE) MG tablet Take 1 tablet (325 mg total) by mouth daily with breakfast. (Patient not taking: Reported on 04/13/2019) 30 tablet 3   Current Facility-Administered Medications  Medication Dose Route Frequency Provider  Last Rate Last Admin  . diphenhydrAMINE (BENADRYL) injection 50 mg  50 mg Intravenous Once PRN Stegmayer, Kimberly A, PA-C      . famotidine (PEPCID) tablet 40 mg  40 mg Oral Once PRN Stegmayer, Abbe [place]        Past Medical History:  Diagnosis Date  . Arthritis   . Atrial fibrillation ([place])   . Carcinoma of prostate ([place])   . CHF (congestive heart failure) ([place])   . Chronic kidney disease   . Diabetes mellitus without complication ([place])   . ED (erectile dysfunction)   . Frequent urination   . Hyperlipidemia   . Hypertension   . Moderate mitral insufficiency   . Peripheral vascular disease ([place])   . Pneumonia 04/2016  . Prostate cancer ([place])   . Sleep apnea    OSA--USE C-PAP  . Ulcer of left foot due to type 2 diabetes mellitus ([place])   . Urinary stress incontinence, male     Past Surgical History:  Procedure Laterality Date  . AMPUTATION Left 01/12/2017   Procedure: AMPUTATION BELOW KNEE;  Surgeon: Algernon Huxley, MD;  Location: [facility];  Service: General;  Laterality: Left;  . AMPUTATION Right 12/27/2018   Procedure: AMPUTATION BELOW KNEE;  Surgeon: Algernon Huxley, MD;  Location: [facility];  Service: General;  Laterality: Right;  . APLIGRAFT PLACEMENT Left 10/15/2016   Procedure: APLIGRAFT PLACEMENT;  Surgeon: Albertine Patricia, DPM;  Location: [facility];  Service: Podiatry;  Laterality: Left;  necrotic ulcer  . CHOLECYSTECTOMY    . COLONOSCOPY WITH PROPOFOL N/A 01/02/2019   Procedure: COLONOSCOPY WITH PROPOFOL;  Surgeon: Lucilla Lame, MD;  Location: [facility] ENDOSCOPY;  Service: Endoscopy;  Laterality: N/A;  . COLONOSCOPY WITH PROPOFOL N/A 01/05/2019   Procedure: COLONOSCOPY WITH PROPOFOL;  Surgeon: Lucilla Lame, MD;  Location: [facility] ENDOSCOPY;  Service: Endoscopy;  Laterality: N/A;  . ESOPHAGOGASTRODUODENOSCOPY (EGD) WITH PROPOFOL N/A 01/02/2019   Procedure: ESOPHAGOGASTRODUODENOSCOPY (EGD) WITH PROPOFOL;  Surgeon: Lucilla Lame, MD;  Location: [facility] ENDOSCOPY;  Service: Endoscopy;   Laterality: N/A;  . EYE SURGERY Bilateral    Cataract Extraction with IOL  . HIP ARTHROPLASTY Right 10/13/2018   Procedure: ARTHROPLASTY BIPOLAR HIP (HEMIARTHROPLASTY);  Surgeon: Earnestine Leys, MD;  Location: [facility];  Service: Orthopedics;  Laterality: Right;  . IRRIGATION AND DEBRIDEMENT FOOT Left 10/15/2016   Procedure: IRRIGATION AND DEBRIDEMENT FOOT-EXCISIONAL DEBRIDEMENT OF SKIN 3RD, 4TH AND 5TH TOES WITH APPLICATION OF APLIGRAFT;  Surgeon: Albertine Patricia, DPM;  Location: [facility];  Service: Podiatry;  Laterality: Left;  necrotic,gangrene  . IRRIGATION AND DEBRIDEMENT FOOT Left 12/09/2016   Procedure: IRRIGATION AND DEBRIDEMENT  FOOT-MUSCLE/FASCIA, RESECTION OF FOURTH AND FIFTH METATARSAL NECROTIC BONE;  Surgeon: Albertine Patricia, DPM;  Location: [facility];  Service: Podiatry;  Laterality: Left;  . IRRIGATION AND DEBRIDEMENT FOOT Right 12/23/2018   Procedure: IRRIGATION AND DEBRIDEMENT FOOT and wound vac application;  Surgeon: Samara Deist, DPM;  Location: [facility];  Service: Podiatry;  Laterality: Right;  . LEFT HEART CATH AND CORONARY ANGIOGRAPHY N/A 06/05/2018   Procedure: LEFT HEART CATH AND CORONARY ANGIOGRAPHY;  Surgeon: Yolonda Kida, MD;  Location: [place] CV LAB;  Service: Cardiovascular;  Laterality: N/A;  . LOWER EXTREMITY ANGIOGRAPHY Left 08/16/2016   Procedure: Lower Extremity Angiography;  Surgeon: Algernon Huxley, MD;  Location: [place] [facility];  Service: Cardiovascular;  Laterality: Left;  . LOWER EXTREMITY ANGIOGRAPHY Left 09/01/2016   Procedure: Lower Extremity Angiography;  Surgeon: Algernon Huxley, MD;  Location: [place] [facility];  Service: Cardiovascular;  Laterality: Left;  . LOWER EXTREMITY ANGIOGRAPHY Left 10/14/2016   Procedure: Lower Extremity Angiography;  Surgeon: Algernon Huxley, MD;  Location: [place] [facility];  Service: Cardiovascular;  Laterality: Left;  . LOWER EXTREMITY ANGIOGRAPHY Left 12/20/2016   Procedure: Lower Extremity Angiography;  Surgeon:  Algernon Huxley, MD;  Location: [place] [facility];  Service: Cardiovascular;  Laterality: Left;  . LOWER EXTREMITY ANGIOGRAPHY Right 12/18/2018   Procedure: LOWER EXTREMITY ANGIOGRAPHY;  Surgeon: Algernon Huxley, MD;  Location: [place] [facility];  Service: Cardiovascular;  Laterality: Right;  . LOWER EXTREMITY INTERVENTION  09/01/2016   Procedure: Lower Extremity Intervention;  Surgeon: Algernon Huxley, MD;  Location: [place] [facility];  Service: Cardiovascular;;  . PROSTATE SURGERY     removal  . TONSILLECTOMY     as a child     Social History   Tobacco Use  . Smoking status: Former Smoker    Packs/day: 0.25    Types: Cigarettes    Quit date: 10/14/1994    Years since quitting: 24.5  . Smokeless tobacco: Never Used  Substance Use Topics  . Alcohol use: No    Alcohol/week: 0.0 standard drinks  . Drug use: No    Family History  Problem Relation Age of Onset  . Hypertension Mother   . Diabetes Mother   . Prostate cancer Neg Hx   . Bladder Cancer Neg Hx   . Kidney disease Neg Hx      Allergies  Allergen Reactions  . Contrast Media [Iodinated Diagnostic Agents] Shortness Of Breath    SOB  . Iohexol Shortness Of Breath     Desc: Respiratory Distress, Laryngedema, diaphoresis, Onset Date: [id number]   . Metrizamide Shortness Of Breath    SOB SOB SOB   . Latex Rash     Review of Systems: Negative Unless Checked Constitutional: [] ?Weight loss[] ?Fever[] ?Chills Cardiac:[] ?Chest pain[] ? Atrial Fibrillation[] ?Palpitations [] ?Shortness of breath when laying flat [] ?Shortness of breath with exertion. [] ?Shortness of breath at rest Vascular: [] ?Pain in legs with walking[] ?Pain in legswith standing[] ?Pain in legs when laying flat [] ?Claudication  [] ?Pain in feet when laying flat [] ?History of DVT [] ?Phlebitis [] ?Swelling in legs [] ?Varicose veins [] ?Non-healing ulcers Pulmonary: [] ?Uses home oxygen [] ?Productive cough[] ?Hemoptysis  [] ?Wheeze [] ?COPD [] ?Asthma Neurologic: [] ?Dizziness[] ?Seizures [] ?Blackouts[] ?History of stroke [] ?History of TIA[] ?Aphasia [] ?Temporary Blindness[] ?Weaknessor numbness in arm [] ?Weakness or numbnessin leg Musculoskeletal:[] ?Joint swelling [] ?Joint pain [] ?Low back pain  [] ? History of Knee Replacement [x] ?Arthritis [] ?back Surgeries[] ? Spinal Stenosis  Hematologic:[] ?Easy bruising[] ?Easy bleeding [] ?Hypercoagulable state [x] ?Anemic Gastrointestinal:[] ?Diarrhea [] ?Vomiting[] ?Gastroesophageal reflux/heartburn[] ?Difficulty swallowing. [] ?Abdominal pain Genitourinary: [x] ?Chronic kidney disease [] ?Difficulturination [] ?Anuric[] ?Blood in urine [] ?Frequenturination [] ?Burning with urination[] ?Hematuria  Skin: [] ?Rashes [] ?Ulcers [] ?Wounds Psychological: [] ?History of anxiety[] ?History of major depression  [x] ? Memory Difficulties  Physical Examination  BP (!) 196/63 (BP Location: Left Arm)   Pulse 97   Resp 16  Gen:  WD/WN, NAD Head: [place]/AT, No temporalis wasting. Ear/Nose/Throat: Hearing grossly intact, nares w/o erythema or drainage Eyes: Conjunctiva clear. Sclera non-icteric Neck: Supple.  Trachea midline Pulmonary:  Good air movement, no use of accessory muscles.  Cardiac: RRR, no JVD Vascular:  Vessel Right Left  Radial Palpable Palpable                       Musculoskeletal: M/S 5/5 throughout.  Bilateral below-knee amputations.  The left is long healed.  The right is as described above with significant decrease in the size of the wound and good granulation tissue. Neurologic: Sensation grossly intact in extremities.  Symmetrical.  Speech is fluent.  Psychiatric: Judgment intact, Mood & affect appropriate for pt's clinical situation. Dermatologic: No rashes or ulcers noted.  No cellulitis or open wounds.       Labs Recent Results (from the past 2160 hour(s))  CBC with Differential     Status: Abnormal     Collection Time: 02/08/19  5:56 AM  Result Value Ref Range   WBC 17.5 (H) 4.0 - 10.5 K/uL   RBC 3.59 (L) 4.22 - 5.81 MIL/uL   Hemoglobin 10.0 (L) 13.0 - 17.0 g/dL   HCT 31.8 (L) 39.0 - 52.0 %   MCV 88.6 80.0 - 100.0 fL   MCH 27.9 26.0 - 34.0 pg   MCHC 31.4 30.0 - 36.0 g/dL   RDW 14.9 11.5 - 15.5 %   Platelets 410 (H) 150 - 400 K/uL   nRBC 0.0 0.0 - 0.2 %   Neutrophils Relative % 85 %   Neutro Abs 15.0 (H) 1.7 - 7.7 K/uL   Lymphocytes Relative 6 %   Lymphs Abs 1.0 0.7 - 4.0 K/uL   Monocytes Relative 4 %   Monocytes Absolute 0.7 0.1 - 1.0 K/uL   Eosinophils Relative 3 %   Eosinophils Absolute 0.6 (H) 0.0 - 0.5 K/uL   Basophils Relative 1 %   Basophils Absolute 0.1 0.0 - 0.1 K/uL   Immature Granulocytes 1 %   Abs Immature Granulocytes 0.13 (H) 0.00 - 0.07 K/uL    Comment: Performed at [facility] [address] [place], [place] [id number]  Basic metabolic panel     Status: Abnormal   Collection Time: 02/08/19  5:56 AM  Result Value Ref Range   Sodium 135 135 - 145 mmol/L   Potassium 4.2 3.5 - 5.1 mmol/L   Chloride 105 98 - 111 mmol/L   CO2 21 (L) 22 - 32 mmol/L   Glucose, Bld 265 (H) 70 - 99 mg/dL   BUN 36 (H) 8 - 23 mg/dL   Creatinine, Ser 1.38 (H) 0.61 - 1.24 mg/dL   Calcium 8.1 (L) 8.9 - 10.3 mg/dL   GFR calc non Af Amer 48 (L) >60 mL/min   GFR calc Af Amer 55 (L) >60 mL/min   Anion gap 9 5 - 15    Comment: Performed at [facility], [address] [place], [place] [address]  Brain natriuretic peptide     Status: Abnormal   Collection Time: 02/08/19  5:56 AM  Result Value Ref Range   B Natriuretic Peptide 839.0 (H) 0.0 - 100.0 pg/mL    Comment: Performed at [facility], [address],  [place], [place] [address]  Lactic acid, plasma     Status: Abnormal   Collection Time: 02/08/19  5:57 AM  Result Value Ref Range   Lactic Acid, Venous 2.4 (HH) 0.5 - 1.9 mmol/L    Comment: CRITICAL RESULT CALLED TO, READ BACK BY AND VERIFIED WITH   LAURA CATES AT 0705 02/08/2019 SDR Performed at [facility], [address] [place], [place] [address]   APTT     Status: None   Collection Time: 02/08/19  5:57 AM  Result Value Ref Range   aPTT 33 24 - 36 seconds    Comment: Performed at [facility], [address] [place], [place] [address]  Protime-INR     Status: Abnormal   Collection Time: 02/08/19  5:57 AM  Result Value Ref Range   Prothrombin Time 30.5 (H) 11.4 - 15.2 seconds   INR 3.0 (H) 0.8 - 1.2    Comment: (NOTE) INR goal varies based on device and disease states. Performed at [facility], [address] [place], [place] [address]   Blood Culture (routine x 2)     Status: None   Collection Time: 02/08/19  5:57 AM   Specimen: BLOOD  Result Value Ref Range   Specimen Description BLOOD BLOOD LEFT HAND    Special Requests      BOTTLES DRAWN AEROBIC AND ANAEROBIC Blood Culture adequate volume   Culture      NO GROWTH 5 DAYS Performed at [facility], [address] [place], [place] [address]    Report Status 02/13/2019 FINAL   Blood Culture (routine x 2)     Status: None   Collection Time: 02/08/19  5:57 AM   Specimen: BLOOD  Result Value Ref Range   Specimen Description BLOOD RIGHT WRIST    Special Requests      BOTTLES DRAWN AEROBIC AND ANAEROBIC Blood Culture results may not be optimal due to an excessive volume of blood received in culture bottles   Culture      NO GROWTH 5 DAYS Performed at [facility], [address] [place],  [address]    Report Status 02/13/2019 FINAL   SARS Coronavirus 2 [facility] order, Performed in [facility] hospital lab) Nasopharyngeal Nasopharyngeal Swab     Status: Abnormal   Collection Time: 02/08/19  6:01 AM   Specimen: Nasopharyngeal Swab  Result Value Ref Range   SARS Coronavirus 2 POSITIVE (A) NEGATIVE    Comment: RESULT CALLED TO, READ BACK BY AND VERIFIED WITH:  LAURA CATES AT 0735 02/08/2019 SDR (NOTE) If  result is NEGATIVE SARS-CoV-2 target nucleic acids are NOT DETECTED. The SARS-CoV-2 RNA is generally detectable in upper and lower  respiratory specimens during the acute phase of infection. The lowest  concentration of SARS-CoV-2 viral copies this assay can detect is 250  copies / mL. A negative result does not preclude SARS-CoV-2 infection  and should not be used as the sole basis for treatment or other  patient management decisions.  A negative result may occur with  improper specimen collection / handling, submission of specimen other  than nasopharyngeal swab, presence of viral mutation(s) within the  areas targeted by this assay, and inadequate number of viral copies  (<250 copies / mL). A negative result must be combined with clinical  observations, patient history, and epidemiological information. If result is POSITIVE SARS-CoV-2 target nucleic acids are DETECTED. Th e SARS-CoV-2 RNA is generally detectable in upper and lower  respiratory specimens during the  acute phase of infection.  Positive  results are indicative of active infection with SARS-CoV-2.  Clinical  correlation with patient history and other diagnostic information is  necessary to determine patient infection status.  Positive results do  not rule out bacterial infection or co-infection with other viruses. If result is PRESUMPTIVE POSTIVE SARS-CoV-2 nucleic acids MAY BE PRESENT.   A presumptive positive result was obtained on the submitted specimen  and confirmed on repeat testing.  While 2019 novel coronavirus  (SARS-CoV-2) nucleic acids may be present in the submitted sample  additional confirmatory testing may be necessary for epidemiological  and / or clinical management purposes  to differentiate between  SARS-CoV-2 and other Sarbecovirus currently known to infect humans.  If clinically indicated additional testing with an alternate test  methodology [phone]) is  advised. The SARS-CoV-2 RNA is generally    detectable in upper and lower respiratory specimens during the acute  phase of infection. The expected result is Negative. Fact Sheet for Patients:  StrictlyIdeas.no Fact Sheet for Healthcare Providers: BankingDealers.co.za This test is not yet approved or cleared by the [place] FDA and has been authorized for detection and/or diagnosis of SARS-CoV-2 by FDA under an Emergency Use Authorization (EUA).  This EUA will remain in effect (meaning this test can be used) for the duration of the COVID-19 declaration under Section 564(b)(1) of the Act, 21 U.S.C. section 360bbb-3(b)(1), unless the authorization is terminated or revoked sooner. Performed at [facility], [address] [place], [place] [address]   Lactic acid, plasma     Status: Abnormal   Collection Time: 02/08/19  9:27 AM  Result Value Ref Range   Lactic Acid, Venous 2.3 (HH) 0.5 - 1.9 mmol/L    Comment: CRITICAL VALUE NOTED. VALUE IS CONSISTENT WITH PREVIOUSLY REPORTED/CALLED VALUE DAS Performed at [facility], [address] [place], [place] [address]   Glucose, capillary     Status: Abnormal   Collection Time: 02/08/19  4:53 PM  Result Value Ref Range   Glucose-Capillary 102 (H) 70 - 99 mg/dL  ABO/Rh     Status: None   Collection Time: 02/08/19  5:07 PM  Result Value Ref Range   ABO/RH(D)      O POS Performed at [place] [address], [place], [place] [address]   C-reactive protein     Status: Abnormal   Collection Time: 02/08/19  5:07 PM  Result Value Ref Range   CRP 1.6 (H) <1.0 mg/dL    Comment: Performed at [facility], [place] [address], [place], [place] [address]  Ferritin     Status: None   Collection Time: 02/08/19  5:07 PM  Result Value Ref Range   Ferritin 69 24 - 336 ng/mL    Comment: Performed at [facility], [place] [address] [place], [place] [address]   Procalcitonin     Status: None   Collection Time: 02/08/19  5:07 PM  Result Value Ref Range   Procalcitonin 0.54 ng/mL    Comment:        Interpretation: PCT > 0.5 ng/mL and <= 2 ng/mL: Systemic infection (sepsis) is possible, but other conditions are known to elevate PCT as well. (NOTE)       Sepsis PCT Algorithm           Lower Respiratory Tract  Infection PCT Algorithm    ----------------------------     ----------------------------         PCT < 0.25 ng/mL                PCT < 0.10 ng/mL         Strongly encourage             Strongly discourage   discontinuation of antibiotics    initiation of antibiotics    ----------------------------     -----------------------------       PCT 0.25 - 0.50 ng/mL            PCT 0.10 - 0.25 ng/mL               OR       >80% decrease in PCT            Discourage initiation of                                            antibiotics      Encourage discontinuation           of antibiotics    ----------------------------     -----------------------------         PCT >= 0.50 ng/mL              PCT 0.26 - 0.50 ng/mL                AND       <80% decrease in PCT             Encourage initiation of                                             antibiotics       Encourage continuation           of antibiotics    ----------------------------     -----------------------------        PCT >= 0.50 ng/mL                  PCT > 0.50 ng/mL               AND         increase in PCT                  Strongly encourage                                      initiation of antibiotics    Strongly encourage escalation           of antibiotics                                     -----------------------------                                           PCT <= 0.25 ng/mL  OR                                        > 80% decrease in PCT                                     Discontinue / Do  not initiate                                             antibiotics Performed at [place] [address] [place], [place] [address]   Hepatic function panel     Status: Abnormal   Collection Time: 02/08/19  5:07 PM  Result Value Ref Range   Total Protein 6.6 6.5 - 8.1 g/dL   Albumin 2.6 (L) 3.5 - 5.0 g/dL   AST 27 15 - 41 U/L   ALT 19 0 - 44 U/L   Alkaline Phosphatase 131 (H) 38 - 126 U/L   Total Bilirubin 0.5 0.3 - 1.2 mg/dL   Bilirubin, Direct 0.1 0.0 - 0.2 mg/dL   Indirect Bilirubin 0.4 0.3 - 0.9 mg/dL    Comment: Performed at [facility], [place] [address], [place], [place] [address]  MRSA PCR Screening     Status: None   Collection Time: 02/08/19  6:45 PM   Specimen: Nasal Mucosa; Nasopharyngeal  Result Value Ref Range   MRSA by PCR NEGATIVE NEGATIVE    Comment:        The GeneXpert MRSA Assay (FDA approved for NASAL specimens only), is one component of a comprehensive MRSA colonization surveillance program. It is not intended to diagnose MRSA infection nor to guide or monitor treatment for MRSA infections. Performed at [facility], [place] [address], [place],  [address]   Glucose, capillary     Status: Abnormal   Collection Time: 02/08/19  9:46 PM  Result Value Ref Range   Glucose-Capillary 249 (H) 70 - 99 mg/dL  CBC with Differential/Platelet     Status: Abnormal   Collection Time: 02/09/19  2:58 AM  Result Value Ref Range   WBC 7.1 4.0 - 10.5 K/uL   RBC 3.29 (L) 4.22 - 5.81 MIL/uL   Hemoglobin 9.0 (L) 13.0 - 17.0 g/dL   HCT 29.5 (L) 39.0 - 52.0 %   MCV 89.7 80.0 - 100.0 fL   MCH 27.4 26.0 - 34.0 pg   MCHC 30.5 30.0 - 36.0 g/dL   RDW 15.3 11.5 - 15.5 %   Platelets 355 150 - 400 K/uL   nRBC 0.0 0.0 - 0.2 %   Neutrophils Relative % 95 %   Neutro Abs 6.7 1.7 - 7.7 K/uL   Lymphocytes Relative 3 %   Lymphs Abs 0.2 (L) 0.7 - 4.0 K/uL   Monocytes Relative 1 %   Monocytes Absolute 0.1 0.1 - 1.0  K/uL   Eosinophils Relative 0 %   Eosinophils Absolute 0.0 0.0 - 0.5 K/uL   Basophils Relative 0 %   Basophils Absolute 0.0 0.0 - 0.1 K/uL   Immature Granulocytes 1 %   Abs Immature Granulocytes 0.06 0.00 - 0.07 K/uL    Comment: Performed at [facility], [address]  [place], [place] [address]  Comprehensive metabolic panel     Status: Abnormal   Collection Time: 02/09/19  2:58 AM  Result Value Ref Range   Sodium 138 135 - 145 mmol/L   Potassium 5.3 (H) 3.5 - 5.1 mmol/L   Chloride 104 98 - 111 mmol/L   CO2 26 22 - 32 mmol/L   Glucose, Bld 313 (H) 70 - 99 mg/dL   BUN 38 (H) 8 - 23 mg/dL   Creatinine, Ser 1.33 (H) 0.61 - 1.24 mg/dL   Calcium 8.4 (L) 8.9 - 10.3 mg/dL   Total Protein 6.5 6.5 - 8.1 g/dL   Albumin 2.5 (L) 3.5 - 5.0 g/dL   AST 28 15 - 41 U/L   ALT 22 0 - 44 U/L   Alkaline Phosphatase 123 38 - 126 U/L   Total Bilirubin 0.7 0.3 - 1.2 mg/dL   GFR calc non Af Amer 50 (L) >60 mL/min   GFR calc Af Amer 58 (L) >60 mL/min   Anion gap 8 5 - 15    Comment: Performed at [facility], [place] [address], [place], [place] [address]  C-reactive protein     Status: Abnormal   Collection Time: 02/09/19  2:58 AM  Result Value Ref Range   CRP 1.8 (H) <1.0 mg/dL    Comment: Performed at [facility], [place] [address], [place], [place] [address]  D-dimer, quantitative (not at [facility])     Status: Abnormal   Collection Time: 02/09/19  2:58 AM  Result Value Ref Range   D-Dimer, Quant 1.25 (H) 0.00 - 0.50 ug/mL-FEU    Comment: (NOTE) At the manufacturer cut-off of 0.50 ug/mL FEU, this assay has been documented to exclude PE with a sensitivity and negative predictive value of 97 to 99%.  At this time, this assay has not been approved by the FDA to exclude DVT/VTE. Results should be correlated with clinical presentation. Performed at [facility], [place] [address], [place], [place] [address]   Ferritin     Status:  None   Collection Time: 02/09/19  2:58 AM  Result Value Ref Range   Ferritin 42 24 - 336 ng/mL    Comment: Performed at [facility], [place] [address], [place], [place] [address]  Magnesium     Status: None   Collection Time: 02/09/19  2:58 AM  Result Value Ref Range   Magnesium 1.8 1.7 - 2.4 mg/dL    Comment: Performed at [facility], [place] [address], [place], [place] [address]  Hemoglobin A1c     Status: Abnormal   Collection Time: 02/09/19  2:58 AM  Result Value Ref Range   Hgb A1c MFr Bld 6.3 (H) 4.8 - 5.6 %    Comment: (NOTE)         Prediabetes: 5.7 - 6.4         Diabetes: >6.4         Glycemic control for adults with diabetes: <7.0    Mean Plasma Glucose 134 mg/dL    Comment: (NOTE) Performed At: [facility] [address] [place], [place] [id number] Rush Farmer MD [id number]   Glucose, capillary     Status: Abnormal   Collection Time: 02/09/19  8:13 AM  Result Value Ref Range   Glucose-Capillary 226 (H) 70 - 99 mg/dL  Glucose, capillary     Status: Abnormal   Collection Time: 02/09/19 12:33 PM  Result Value Ref Range   Glucose-Capillary 306 (H)  70 - 99 mg/dL  Troponin I (High Sensitivity)     Status: Abnormal   Collection Time: 02/09/19  1:56 PM  Result Value Ref Range   Troponin I (High Sensitivity) 264 (HH) <18 ng/L    Comment: CRITICAL RESULT CALLED TO, READ BACK BY AND VERIFIED WITH: MOORE,L. RN @1605  ON 09.18.2020 BY COHEN,K (NOTE) Elevated high sensitivity troponin I (hsTnI) values and significant  changes across serial measurements may suggest ACS but many other  chronic and acute conditions are known to elevate hsTnI results.  Refer to the Links section for chest pain algorithms and additional  guidance. Performed at [facility], [place] [address] [place], [place] [address]   Potassium     Status: None   Collection Time: 02/09/19  1:56 PM  Result Value Ref Range   Potassium 4.2 3.5  - 5.1 mmol/L    Comment: DELTA CHECK NOTED REPEATED TO VERIFY Performed at [place] [address], [place], [place] [address]   Troponin I (High Sensitivity)     Status: Abnormal   Collection Time: 02/09/19  3:13 PM  Result Value Ref Range   Troponin I (High Sensitivity) 400 (HH) <18 ng/L    Comment: DELTA CHECK NOTED CRITICAL RESULT CALLED TO, READ BACK BY AND VERIFIED WITH: MOORE,L RN @1712  ON 02/09/2019 JACKSON,K (NOTE) Elevated high sensitivity troponin I (hsTnI) values and significant  changes across serial measurements may suggest ACS but many other  chronic and acute conditions are known to elevate hsTnI results.  Refer to the Links section for chest pain algorithms and additional  guidance. Performed at [facility], [place] [address] [place], [place] [address]   Glucose, capillary     Status: Abnormal   Collection Time: 02/09/19  4:36 PM  Result Value Ref Range   Glucose-Capillary 184 (H) 70 - 99 mg/dL  Glucose, capillary     Status: Abnormal   Collection Time: 02/09/19  9:16 PM  Result Value Ref Range   Glucose-Capillary 268 (H) 70 - 99 mg/dL  CBC with Differential/Platelet     Status: Abnormal   Collection Time: 02/10/19  1:50 AM  Result Value Ref Range   WBC 9.0 4.0 - 10.5 K/uL   RBC 3.05 (L) 4.22 - 5.81 MIL/uL   Hemoglobin 8.5 (L) 13.0 - 17.0 g/dL   HCT 27.1 (L) 39.0 - 52.0 %   MCV 88.9 80.0 - 100.0 fL   MCH 27.9 26.0 - 34.0 pg   MCHC 31.4 30.0 - 36.0 g/dL   RDW 15.4 11.5 - 15.5 %   Platelets 375 150 - 400 K/uL   nRBC 0.0 0.0 - 0.2 %   Neutrophils Relative % 93 %   Neutro Abs 8.4 (H) 1.7 - 7.7 K/uL   Lymphocytes Relative 4 %   Lymphs Abs 0.4 (L) 0.7 - 4.0 K/uL   Monocytes Relative 2 %   Monocytes Absolute 0.2 0.1 - 1.0 K/uL   Eosinophils Relative 0 %   Eosinophils Absolute 0.0 0.0 - 0.5 K/uL   Basophils Relative 0 %   Basophils Absolute 0.0 0.0 - 0.1 K/uL   Immature Granulocytes 1 %   Abs Immature Granulocytes  0.06 0.00 - 0.07 K/uL    Comment: Performed at [facility], [place] [address] [place], [place] [address]  Comprehensive metabolic panel     Status: Abnormal   Collection Time: 02/10/19  1:50 AM  Result Value Ref Range   Sodium 136 135 - 145  mmol/L   Potassium 4.6 3.5 - 5.1 mmol/L   Chloride 104 98 - 111 mmol/L   CO2 24 22 - 32 mmol/L   Glucose, Bld 299 (H) 70 - 99 mg/dL   BUN 43 (H) 8 - 23 mg/dL   Creatinine, Ser 1.08 0.61 - 1.24 mg/dL   Calcium 8.4 (L) 8.9 - 10.3 mg/dL   Total Protein 6.3 (L) 6.5 - 8.1 g/dL   Albumin 2.6 (L) 3.5 - 5.0 g/dL   AST 30 15 - 41 U/L   ALT 23 0 - 44 U/L   Alkaline Phosphatase 123 38 - 126 U/L   Total Bilirubin 0.7 0.3 - 1.2 mg/dL   GFR calc non Af Amer >60 >60 mL/min   GFR calc Af Amer >60 >60 mL/min   Anion gap 8 5 - 15    Comment: Performed at [facility], [place] [address], [place], [place] [address]  C-reactive protein     Status: Abnormal   Collection Time: 02/10/19  1:50 AM  Result Value Ref Range   CRP 1.5 (H) <1.0 mg/dL    Comment: Performed at [facility], [place] [address], [place], [place] [address]  D-dimer, quantitative (not at [facility])     Status: Abnormal   Collection Time: 02/10/19  1:50 AM  Result Value Ref Range   D-Dimer, Quant 0.98 (H) 0.00 - 0.50 ug/mL-FEU    Comment: (NOTE) At the manufacturer cut-off of 0.50 ug/mL FEU, this assay has been documented to exclude PE with a sensitivity and negative predictive value of 97 to 99%.  At this time, this assay has not been approved by the FDA to exclude DVT/VTE. Results should be correlated with clinical presentation. Performed at [facility], [place] [address] [place], [place] [address]   Ferritin     Status: None   Collection Time: 02/10/19  1:50 AM  Result Value Ref Range   Ferritin 60 24 - 336 ng/mL    Comment: Performed at [facility], [place] [address] [place], [place] [address]  Magnesium      Status: None   Collection Time: 02/10/19  1:50 AM  Result Value Ref Range   Magnesium 1.8 1.7 - 2.4 mg/dL    Comment: Performed at [facility], [place] [address] [place], [place] [address]  Glucose, capillary     Status: Abnormal   Collection Time: 02/10/19  7:21 AM  Result Value Ref Range   Glucose-Capillary 238 (H) 70 - 99 mg/dL  Glucose, capillary     Status: Abnormal   Collection Time: 02/10/19 11:27 AM  Result Value Ref Range   Glucose-Capillary 372 (H) 70 - 99 mg/dL  Glucose, capillary     Status: Abnormal   Collection Time: 02/10/19  3:48 PM  Result Value Ref Range   Glucose-Capillary 248 (H) 70 - 99 mg/dL  Glucose, capillary     Status: Abnormal   Collection Time: 02/10/19  9:13 PM  Result Value Ref Range   Glucose-Capillary 344 (H) 70 - 99 mg/dL  CBC with Differential/Platelet     Status: Abnormal   Collection Time: 02/11/19  7:00 AM  Result Value Ref Range   WBC 11.3 (H) 4.0 - 10.5 K/uL   RBC 3.56 (L) 4.22 - 5.81 MIL/uL   Hemoglobin 9.9 (L) 13.0 - 17.0 g/dL   HCT 31.4 (L) 39.0 - 52.0 %   MCV 88.2 80.0 - 100.0 fL   MCH 27.8 26.0 - 34.0 pg  MCHC 31.5 30.0 - 36.0 g/dL   RDW 15.5 11.5 - 15.5 %   Platelets 398 150 - 400 K/uL   nRBC 0.0 0.0 - 0.2 %   Neutrophils Relative % 88 %   Neutro Abs 10.1 (H) 1.7 - 7.7 K/uL   Lymphocytes Relative 6 %   Lymphs Abs 0.6 (L) 0.7 - 4.0 K/uL   Monocytes Relative 5 %   Monocytes Absolute 0.5 0.1 - 1.0 K/uL   Eosinophils Relative 0 %   Eosinophils Absolute 0.0 0.0 - 0.5 K/uL   Basophils Relative 0 %   Basophils Absolute 0.0 0.0 - 0.1 K/uL   Immature Granulocytes 1 %   Abs Immature Granulocytes 0.07 0.00 - 0.07 K/uL    Comment: Performed at [facility], [place] [address], [place], [place] [address]  Comprehensive metabolic panel     Status: Abnormal   Collection Time: 02/11/19  7:00 AM  Result Value Ref Range   Sodium 137 135 - 145 mmol/L   Potassium 4.2 3.5 - 5.1 mmol/L   Chloride 102 98 -  111 mmol/L   CO2 27 22 - 32 mmol/L   Glucose, Bld 253 (H) 70 - 99 mg/dL   BUN 41 (H) 8 - 23 mg/dL   Creatinine, Ser 1.13 0.61 - 1.24 mg/dL   Calcium 8.5 (L) 8.9 - 10.3 mg/dL   Total Protein 6.4 (L) 6.5 - 8.1 g/dL   Albumin 2.7 (L) 3.5 - 5.0 g/dL   AST 42 (H) 15 - 41 U/L   ALT 38 0 - 44 U/L   Alkaline Phosphatase 120 38 - 126 U/L   Total Bilirubin 0.5 0.3 - 1.2 mg/dL   GFR calc non Af Amer >60 >60 mL/min   GFR calc Af Amer >60 >60 mL/min   Anion gap 8 5 - 15    Comment: Performed at [facility] , [place] [address] [place], [place] [address]  Magnesium     Status: None   Collection Time: 02/11/19  7:00 AM  Result Value Ref Range   Magnesium 1.8 1.7 - 2.4 mg/dL    Comment: Performed at [facility], [place] [address] [place], [place] [address]  Glucose, capillary     Status: Abnormal   Collection Time: 02/11/19  7:37 AM  Result Value Ref Range   Glucose-Capillary 235 (H) 70 - 99 mg/dL  Glucose, capillary     Status: Abnormal   Collection Time: 02/11/19 12:07 PM  Result Value Ref Range   Glucose-Capillary 400 (H) 70 - 99 mg/dL  Glucose, capillary     Status: Abnormal   Collection Time: 02/11/19  4:41 PM  Result Value Ref Range   Glucose-Capillary 449 (H) 70 - 99 mg/dL  Glucose, capillary     Status: Abnormal   Collection Time: 02/11/19  9:44 PM  Result Value Ref Range   Glucose-Capillary 145 (H) 70 - 99 mg/dL  CBC with Differential/Platelet     Status: Abnormal   Collection Time: 02/12/19  3:50 AM  Result Value Ref Range   WBC 14.0 (H) 4.0 - 10.5 K/uL   RBC 3.45 (L) 4.22 - 5.81 MIL/uL   Hemoglobin 9.5 (L) 13.0 - 17.0 g/dL   HCT 30.6 (L) 39.0 - 52.0 %   MCV 88.7 80.0 - 100.0 fL   MCH 27.5 26.0 - 34.0 pg   MCHC 31.0 30.0 - 36.0 g/dL   RDW 15.5 11.5 - 15.5 %   Platelets 378 150 - 400 K/uL  nRBC 0.1 0.0 - 0.2 %   Neutrophils Relative % 86 %   Neutro Abs 12.1 (H) 1.7 - 7.7 K/uL   Lymphocytes Relative 7 %   Lymphs Abs 0.9 0.7 - 4.0 K/uL    Monocytes Relative 6 %   Monocytes Absolute 0.8 0.1 - 1.0 K/uL   Eosinophils Relative 0 %   Eosinophils Absolute 0.0 0.0 - 0.5 K/uL   Basophils Relative 0 %   Basophils Absolute 0.0 0.0 - 0.1 K/uL   Immature Granulocytes 1 %   Abs Immature Granulocytes 0.19 (H) 0.00 - 0.07 K/uL    Comment: Performed at [facility], [place] [address], [place], [place] [address]  Comprehensive metabolic panel     Status: Abnormal   Collection Time: 02/12/19  3:50 AM  Result Value Ref Range   Sodium 138 135 - 145 mmol/L   Potassium 4.6 3.5 - 5.1 mmol/L   Chloride 103 98 - 111 mmol/L   CO2 26 22 - 32 mmol/L   Glucose, Bld 227 (H) 70 - 99 mg/dL   BUN 43 (H) 8 - 23 mg/dL   Creatinine, Ser 1.03 0.61 - 1.24 mg/dL   Calcium 8.4 (L) 8.9 - 10.3 mg/dL   Total Protein 5.9 (L) 6.5 - 8.1 g/dL   Albumin 2.4 (L) 3.5 - 5.0 g/dL   AST 36 15 - 41 U/L   ALT 38 0 - 44 U/L   Alkaline Phosphatase 98 38 - 126 U/L   Total Bilirubin 0.5 0.3 - 1.2 mg/dL   GFR calc non Af Amer >60 >60 mL/min   GFR calc Af Amer >60 >60 mL/min   Anion gap 9 5 - 15    Comment: Performed at [facility], [place] [address], [place], [place] [address]  Magnesium     Status: None   Collection Time: 02/12/19  3:50 AM  Result Value Ref Range   Magnesium 1.9 1.7 - 2.4 mg/dL    Comment: Performed at [facility], [place] [address] [place], [place] [address]  Glucose, capillary     Status: Abnormal   Collection Time: 02/12/19  7:50 AM  Result Value Ref Range   Glucose-Capillary 217 (H) 70 - 99 mg/dL  Glucose, capillary     Status: Abnormal   Collection Time: 02/12/19 11:43 AM  Result Value Ref Range   Glucose-Capillary 167 (H) 70 - 99 mg/dL  Glucose, capillary     Status: None   Collection Time: 02/12/19  3:56 PM  Result Value Ref Range   Glucose-Capillary 96 70 - 99 mg/dL  Glucose, capillary     Status: None   Collection Time: 02/12/19  9:21 PM  Result Value Ref Range   Glucose-Capillary 80  70 - 99 mg/dL  CBC with Differential/Platelet     Status: Abnormal   Collection Time: 02/13/19  2:45 AM  Result Value Ref Range   WBC 12.1 (H) 4.0 - 10.5 K/uL   RBC 3.64 (L) 4.22 - 5.81 MIL/uL   Hemoglobin 10.1 (L) 13.0 - 17.0 g/dL   HCT 32.1 (L) 39.0 - 52.0 %   MCV 88.2 80.0 - 100.0 fL   MCH 27.7 26.0 - 34.0 pg   MCHC 31.5 30.0 - 36.0 g/dL   RDW 15.9 (H) 11.5 - 15.5 %   Platelets 372 150 - 400 K/uL   nRBC 0.0 0.0 - 0.2 %   Neutrophils Relative % 77 %   Neutro Abs 9.3 (H) 1.7 - 7.7 K/uL  Lymphocytes Relative 13 %   Lymphs Abs 1.6 0.7 - 4.0 K/uL   Monocytes Relative 8 %   Monocytes Absolute 1.0 0.1 - 1.0 K/uL   Eosinophils Relative 1 %   Eosinophils Absolute 0.2 0.0 - 0.5 K/uL   Basophils Relative 0 %   Basophils Absolute 0.0 0.0 - 0.1 K/uL   Immature Granulocytes 1 %   Abs Immature Granulocytes 0.08 (H) 0.00 - 0.07 K/uL    Comment: Performed at [facility], [place] [address] [place], [place] [address]  Comprehensive metabolic panel     Status: Abnormal   Collection Time: 02/13/19  2:45 AM  Result Value Ref Range   Sodium 139 135 - 145 mmol/L   Potassium 3.8 3.5 - 5.1 mmol/L    Comment: DELTA CHECK NOTED   Chloride 103 98 - 111 mmol/L   CO2 26 22 - 32 mmol/L   Glucose, Bld 69 (L) 70 - 99 mg/dL   BUN 35 (H) 8 - 23 mg/dL   Creatinine, Ser 0.97 0.61 - 1.24 mg/dL   Calcium 8.3 (L) 8.9 - 10.3 mg/dL   Total Protein 6.1 (L) 6.5 - 8.1 g/dL   Albumin 2.4 (L) 3.5 - 5.0 g/dL   AST 45 (H) 15 - 41 U/L   ALT 42 0 - 44 U/L   Alkaline Phosphatase 85 38 - 126 U/L   Total Bilirubin 0.7 0.3 - 1.2 mg/dL   GFR calc non Af Amer >60 >60 mL/min   GFR calc Af Amer >60 >60 mL/min   Anion gap 10 5 - 15    Comment: Performed at [facility], [place] [address], [place], [place] [address]  Magnesium     Status: None   Collection Time: 02/13/19  2:45 AM  Result Value Ref Range   Magnesium 1.9 1.7 - 2.4 mg/dL    Comment: Performed at [facility], [place] [address] [place] [address], [place], [place] [address]  Glucose, capillary     Status: Abnormal   Collection Time: 02/13/19  8:49 AM  Result Value Ref Range   Glucose-Capillary 66 (L) 70 - 99 mg/dL  Glucose, capillary     Status: None   Collection Time: 02/13/19 12:15 PM  Result Value Ref Range   Glucose-Capillary 76 70 - 99 mg/dL  Glucose, capillary     Status: None   Collection Time: 02/13/19  4:13 PM  Result Value Ref Range   Glucose-Capillary 97 70 - 99 mg/dL  Glucose, capillary     Status: Abnormal   Collection Time: 02/13/19  9:06 PM  Result Value Ref Range   Glucose-Capillary 130 (H) 70 - 99 mg/dL  CBC     Status: Abnormal   Collection Time: 02/14/19  4:00 AM  Result Value Ref Range   WBC 10.0 4.0 - 10.5 K/uL   RBC 3.96 (L) 4.22 - 5.81 MIL/uL   Hemoglobin 11.0 (L) 13.0 - 17.0 g/dL   HCT 35.4 (L) 39.0 - 52.0 %   MCV 89.4 80.0 - 100.0 fL   MCH 27.8 26.0 - 34.0 pg   MCHC 31.1 30.0 - 36.0 g/dL   RDW 16.1 (H) 11.5 - 15.5 %   Platelets 408 (H) 150 - 400 K/uL   nRBC 0.0 0.0 - 0.2 %    Comment: Performed at [facility], [place] [address] [place], [place] [id number]  Basic metabolic panel     Status: Abnormal   Collection Time: 02/14/19  4:00 AM  Result  Value Ref Range   Sodium 140 135 - 145 mmol/L   Potassium 4.4 3.5 - 5.1 mmol/L   Chloride 103 98 - 111 mmol/L   CO2 26 22 - 32 mmol/L   Glucose, Bld 176 (H) 70 - 99 mg/dL   BUN 38 (H) 8 - 23 mg/dL   Creatinine, Ser 1.11 0.61 - 1.24 mg/dL   Calcium 8.5 (L) 8.9 - 10.3 mg/dL   GFR calc non Af Amer >60 >60 mL/min   GFR calc Af Amer >60 >60 mL/min   Anion gap 11 5 - 15    Comment: Performed at [facility], [place] [address] [place], [place] [address]  Magnesium     Status: None   Collection Time: 02/14/19  4:00 AM  Result Value Ref Range   Magnesium 2.1 1.7 - 2.4 mg/dL    Comment: Performed at [facility], [place] [address] [place], [place] [address]  Glucose, capillary      Status: Abnormal   Collection Time: 02/14/19  8:19 AM  Result Value Ref Range   Glucose-Capillary 172 (H) 70 - 99 mg/dL  Glucose, capillary     Status: Abnormal   Collection Time: 02/14/19 11:48 AM  Result Value Ref Range   Glucose-Capillary 193 (H) 70 - 99 mg/dL  Glucose, capillary     Status: Abnormal   Collection Time: 02/14/19  3:35 PM  Result Value Ref Range   Glucose-Capillary 149 (H) 70 - 99 mg/dL  Glucose, capillary     Status: Abnormal   Collection Time: 02/14/19  8:42 PM  Result Value Ref Range   Glucose-Capillary 290 (H) 70 - 99 mg/dL  Glucose, capillary     Status: Abnormal   Collection Time: 02/15/19  7:30 AM  Result Value Ref Range   Glucose-Capillary 128 (H) 70 - 99 mg/dL  Glucose, capillary     Status: Abnormal   Collection Time: 02/15/19 11:31 AM  Result Value Ref Range   Glucose-Capillary 131 (H) 70 - 99 mg/dL  Glucose, capillary     Status: Abnormal   Collection Time: 02/15/19  4:39 PM  Result Value Ref Range   Glucose-Capillary 54 (L) 70 - 99 mg/dL  Glucose, capillary     Status: None   Collection Time: 02/15/19  5:11 PM  Result Value Ref Range   Glucose-Capillary 80 70 - 99 mg/dL  Glucose, capillary     Status: Abnormal   Collection Time: 02/15/19  7:43 PM  Result Value Ref Range   Glucose-Capillary 102 (H) 70 - 99 mg/dL  Glucose, capillary     Status: None   Collection Time: 02/16/19  7:52 AM  Result Value Ref Range   Glucose-Capillary 99 70 - 99 mg/dL  Glucose, capillary     Status: Abnormal   Collection Time: 02/16/19 11:43 AM  Result Value Ref Range   Glucose-Capillary 203 (H) 70 - 99 mg/dL  Glucose, capillary     Status: Abnormal   Collection Time: 02/16/19  4:27 PM  Result Value Ref Range   Glucose-Capillary 146 (H) 70 - 99 mg/dL  Glucose, capillary     Status: Abnormal   Collection Time: 02/16/19  8:55 PM  Result Value Ref Range   Glucose-Capillary 219 (H) 70 - 99 mg/dL  Glucose, capillary     Status: Abnormal   Collection Time:  02/17/19  7:53 AM  Result Value Ref Range   Glucose-Capillary 127 (H) 70 - 99 mg/dL  Glucose, capillary  Status: Abnormal   Collection Time: 02/17/19 11:52 AM  Result Value Ref Range   Glucose-Capillary 290 (H) 70 - 99 mg/dL  Glucose, capillary     Status: Abnormal   Collection Time: 02/17/19  4:40 PM  Result Value Ref Range   Glucose-Capillary 150 (H) 70 - 99 mg/dL  Glucose, capillary     Status: Abnormal   Collection Time: 02/17/19  9:25 PM  Result Value Ref Range   Glucose-Capillary 205 (H) 70 - 99 mg/dL  Glucose, capillary     Status: Abnormal   Collection Time: 02/18/19  8:11 AM  Result Value Ref Range   Glucose-Capillary 115 (H) 70 - 99 mg/dL  Glucose, capillary     Status: Abnormal   Collection Time: 02/18/19 11:56 AM  Result Value Ref Range   Glucose-Capillary 352 (H) 70 - 99 mg/dL  Glucose, capillary     Status: Abnormal   Collection Time: 02/18/19  3:42 PM  Result Value Ref Range   Glucose-Capillary 246 (H) 70 - 99 mg/dL  Glucose, capillary     Status: Abnormal   Collection Time: 02/18/19  9:43 PM  Result Value Ref Range   Glucose-Capillary 67 (L) 70 - 99 mg/dL  Glucose, capillary     Status: None   Collection Time: 02/18/19 10:01 PM  Result Value Ref Range   Glucose-Capillary 76 70 - 99 mg/dL  Glucose, capillary     Status: None   Collection Time: 02/18/19 10:32 PM  Result Value Ref Range   Glucose-Capillary 98 70 - 99 mg/dL  Glucose, capillary     Status: Abnormal   Collection Time: 02/19/19  4:37 AM  Result Value Ref Range   Glucose-Capillary 182 (H) 70 - 99 mg/dL  Glucose, capillary     Status: Abnormal   Collection Time: 02/19/19  9:33 AM  Result Value Ref Range   Glucose-Capillary 100 (H) 70 - 99 mg/dL  Glucose, capillary     Status: Abnormal   Collection Time: 02/19/19 11:37 AM  Result Value Ref Range   Glucose-Capillary 146 (H) 70 - 99 mg/dL  Glucose, capillary     Status: Abnormal   Collection Time: 02/19/19  1:39 PM  Result Value Ref Range     Glucose-Capillary 236 (H) 70 - 99 mg/dL  Basic metabolic panel     Status: Abnormal   Collection Time: 04/06/19 12:14 PM  Result Value Ref Range   Sodium 139 135 - 145 mmol/L   Potassium 3.7 3.5 - 5.1 mmol/L   Chloride 106 98 - 111 mmol/L   CO2 25 22 - 32 mmol/L   Glucose, Bld 323 (H) 70 - 99 mg/dL   BUN 31 (H) 8 - 23 mg/dL   Creatinine, Ser 1.02 0.61 - 1.24 mg/dL   Calcium 8.1 (L) 8.9 - 10.3 mg/dL   GFR calc non Af Amer >60 >60 mL/min   GFR calc Af Amer >60 >60 mL/min   Anion gap 8 5 - 15    Comment: Performed at [facility], [place] [place], [place] [address]  CBC with Differential     Status: Abnormal   Collection Time: 04/06/19 12:14 PM  Result Value Ref Range   WBC 9.7 4.0 - 10.5 K/uL   RBC 3.64 (L) 4.22 - 5.81 MIL/uL   Hemoglobin 10.3 (L) 13.0 - 17.0 g/dL   HCT 31.2 (L) 39.0 - 52.0 %   MCV 85.7 80.0 - 100.0 fL   MCH 28.3 26.0 - 34.0 pg  MCHC 33.0 30.0 - 36.0 g/dL   RDW 17.1 (H) 11.5 - 15.5 %   Platelets 253 150 - 400 K/uL   nRBC 0.0 0.0 - 0.2 %   Neutrophils Relative % 70 %   Neutro Abs 6.9 1.7 - 7.7 K/uL   Lymphocytes Relative 13 %   Lymphs Abs 1.2 0.7 - 4.0 K/uL   Monocytes Relative 7 %   Monocytes Absolute 0.7 0.1 - 1.0 K/uL   Eosinophils Relative 8 %   Eosinophils Absolute 0.7 (H) 0.0 - 0.5 K/uL   Basophils Relative 1 %   Basophils Absolute 0.1 0.0 - 0.1 K/uL   Immature Granulocytes 1 %   Abs Immature Granulocytes 0.05 0.00 - 0.07 K/uL    Comment: Performed at [facility], [place], [place] [address]  Troponin I (High Sensitivity)     Status: Abnormal   Collection Time: 04/06/19 12:14 PM  Result Value Ref Range   Troponin I (High Sensitivity) 37 (H) <18 ng/L    Comment: (NOTE) Elevated high sensitivity troponin I (hsTnI) values and significant  changes across serial measurements may suggest ACS but many other  chronic and acute conditions are known to elevate hsTnI results.  Refer to the "Links" section for  chest pain algorithms and additional  guidance. Performed at [facility], [place], [place] [address]   Troponin I (High Sensitivity)     Status: Abnormal   Collection Time: 04/06/19  2:28 PM  Result Value Ref Range   Troponin I (High Sensitivity) 46 (H) <18 ng/L    Comment: (NOTE) Elevated high sensitivity troponin I (hsTnI) values and significant  changes across serial measurements may suggest ACS but many other  chronic and acute conditions are known to elevate hsTnI results.  Refer to the "Links" section for chest pain algorithms and additional  guidance. Performed at [facility], [address], [place], [place] [address]     Radiology No results found.  Assessment/Plan  Essential hypertension blood pressure control important in reducing the progression of atherosclerotic disease. On appropriate oral medications.   Type 2 diabetes mellitus treated with insulin (HCC) blood glucose control important in reducing the progression of atherosclerotic disease. Also, involved in wound healing. On appropriate medications.   Hx of BKA, right ([place]) Continues to improve.  Also doing a much better job of straightening his knee.  My hope is within about a month, his wound will be healed or almost completely healed so that we can make a referral to the prosthetics company for further evaluation for her right below-knee amputation prosthesis.    Leotis Pain, MD  05/08/2019 1:37 PM    This note was created with Dragon medical transcription system.  Any errors from dictation are purely unintentional

## 2019-05-08 NOTE — Assessment & Plan Note (Signed)
blood pressure control important in reducing the progression of atherosclerotic disease. On appropriate oral medications.  

## 2019-05-08 NOTE — Assessment & Plan Note (Signed)
Continues to improve.  Also doing a much better job of straightening his knee.  My hope is within about a month, his wound will be healed or almost completely healed so that we can make a referral to the prosthetics company for further evaluation for her right below-knee amputation prosthesis.

## 2019-05-16 DIAGNOSIS — R001 Bradycardia, unspecified: Secondary | ICD-10-CM | POA: Insufficient documentation

## 2019-06-12 ENCOUNTER — Ambulatory Visit (INDEPENDENT_AMBULATORY_CARE_PROVIDER_SITE_OTHER): Payer: Medicare Other | Admitting: Vascular Surgery

## 2019-06-24 ENCOUNTER — Encounter
Admission: EM | Disposition: A | Discharge status: Home / Self Care | Payer: Self-pay | Source: Home / Self Care | Attending: Internal Medicine

## 2019-06-24 ENCOUNTER — Emergency Department: Payer: Medicare Other

## 2019-06-24 ENCOUNTER — Inpatient Hospital Stay
Admission: EM | Admit: 2019-06-24 | Discharge: 2019-06-26 | DRG: 280 | Disposition: A | Discharge status: Home / Self Care | Payer: Medicare Other | Attending: Internal Medicine | Admitting: Internal Medicine

## 2019-06-24 ENCOUNTER — Encounter: Payer: Self-pay | Admitting: Family Medicine

## 2019-06-24 ENCOUNTER — Other Ambulatory Visit: Payer: Self-pay

## 2019-06-24 DIAGNOSIS — N189 Chronic kidney disease, unspecified: Secondary | ICD-10-CM | POA: Diagnosis present

## 2019-06-24 DIAGNOSIS — Z9049 Acquired absence of other specified parts of digestive tract: Secondary | ICD-10-CM

## 2019-06-24 DIAGNOSIS — I249 Acute ischemic heart disease, unspecified: Secondary | ICD-10-CM | POA: Diagnosis not present

## 2019-06-24 DIAGNOSIS — Z9079 Acquired absence of other genital organ(s): Secondary | ICD-10-CM

## 2019-06-24 DIAGNOSIS — Z9089 Acquired absence of other organs: Secondary | ICD-10-CM

## 2019-06-24 DIAGNOSIS — I25110 Atherosclerotic heart disease of native coronary artery with unstable angina pectoris: Secondary | ICD-10-CM | POA: Diagnosis present

## 2019-06-24 DIAGNOSIS — I214 Non-ST elevation (NSTEMI) myocardial infarction: Principal | ICD-10-CM | POA: Diagnosis present

## 2019-06-24 DIAGNOSIS — I482 Chronic atrial fibrillation, unspecified: Secondary | ICD-10-CM | POA: Diagnosis present

## 2019-06-24 DIAGNOSIS — I252 Old myocardial infarction: Secondary | ICD-10-CM

## 2019-06-24 DIAGNOSIS — I10 Essential (primary) hypertension: Secondary | ICD-10-CM | POA: Diagnosis not present

## 2019-06-24 DIAGNOSIS — Z9841 Cataract extraction status, right eye: Secondary | ICD-10-CM

## 2019-06-24 DIAGNOSIS — Z8701 Personal history of pneumonia (recurrent): Secondary | ICD-10-CM

## 2019-06-24 DIAGNOSIS — Z888 Allergy status to other drugs, medicaments and biological substances status: Secondary | ICD-10-CM

## 2019-06-24 DIAGNOSIS — R451 Restlessness and agitation: Secondary | ICD-10-CM | POA: Diagnosis not present

## 2019-06-24 DIAGNOSIS — G4733 Obstructive sleep apnea (adult) (pediatric): Secondary | ICD-10-CM | POA: Diagnosis present

## 2019-06-24 DIAGNOSIS — Z79899 Other long term (current) drug therapy: Secondary | ICD-10-CM

## 2019-06-24 DIAGNOSIS — I5033 Acute on chronic diastolic (congestive) heart failure: Secondary | ICD-10-CM

## 2019-06-24 DIAGNOSIS — E1122 Type 2 diabetes mellitus with diabetic chronic kidney disease: Secondary | ICD-10-CM | POA: Diagnosis present

## 2019-06-24 DIAGNOSIS — Z89511 Acquired absence of right leg below knee: Secondary | ICD-10-CM

## 2019-06-24 DIAGNOSIS — Z8546 Personal history of malignant neoplasm of prostate: Secondary | ICD-10-CM

## 2019-06-24 DIAGNOSIS — E1165 Type 2 diabetes mellitus with hyperglycemia: Secondary | ICD-10-CM | POA: Diagnosis present

## 2019-06-24 DIAGNOSIS — E1100 Type 2 diabetes mellitus with hyperosmolarity without nonketotic hyperglycemic-hyperosmolar coma (NKHHC): Secondary | ICD-10-CM | POA: Diagnosis not present

## 2019-06-24 DIAGNOSIS — Z8249 Family history of ischemic heart disease and other diseases of the circulatory system: Secondary | ICD-10-CM

## 2019-06-24 DIAGNOSIS — I25118 Atherosclerotic heart disease of native coronary artery with other forms of angina pectoris: Secondary | ICD-10-CM | POA: Diagnosis not present

## 2019-06-24 DIAGNOSIS — E1151 Type 2 diabetes mellitus with diabetic peripheral angiopathy without gangrene: Secondary | ICD-10-CM | POA: Diagnosis present

## 2019-06-24 DIAGNOSIS — Z7982 Long term (current) use of aspirin: Secondary | ICD-10-CM

## 2019-06-24 DIAGNOSIS — E785 Hyperlipidemia, unspecified: Secondary | ICD-10-CM | POA: Diagnosis present

## 2019-06-24 DIAGNOSIS — Z833 Family history of diabetes mellitus: Secondary | ICD-10-CM

## 2019-06-24 DIAGNOSIS — I160 Hypertensive urgency: Secondary | ICD-10-CM | POA: Diagnosis not present

## 2019-06-24 DIAGNOSIS — I130 Hypertensive heart and chronic kidney disease with heart failure and stage 1 through stage 4 chronic kidney disease, or unspecified chronic kidney disease: Secondary | ICD-10-CM | POA: Diagnosis present

## 2019-06-24 DIAGNOSIS — Z96641 Presence of right artificial hip joint: Secondary | ICD-10-CM | POA: Diagnosis present

## 2019-06-24 DIAGNOSIS — I480 Paroxysmal atrial fibrillation: Secondary | ICD-10-CM | POA: Diagnosis present

## 2019-06-24 DIAGNOSIS — R079 Chest pain, unspecified: Secondary | ICD-10-CM

## 2019-06-24 DIAGNOSIS — Z8616 Personal history of COVID-19: Secondary | ICD-10-CM | POA: Diagnosis not present

## 2019-06-24 DIAGNOSIS — Z961 Presence of intraocular lens: Secondary | ICD-10-CM | POA: Diagnosis present

## 2019-06-24 DIAGNOSIS — R0789 Other chest pain: Secondary | ICD-10-CM | POA: Diagnosis not present

## 2019-06-24 DIAGNOSIS — Z794 Long term (current) use of insulin: Secondary | ICD-10-CM

## 2019-06-24 DIAGNOSIS — Z89512 Acquired absence of left leg below knee: Secondary | ICD-10-CM | POA: Diagnosis not present

## 2019-06-24 DIAGNOSIS — Z87891 Personal history of nicotine dependence: Secondary | ICD-10-CM

## 2019-06-24 DIAGNOSIS — Z7901 Long term (current) use of anticoagulants: Secondary | ICD-10-CM

## 2019-06-24 DIAGNOSIS — Z9842 Cataract extraction status, left eye: Secondary | ICD-10-CM

## 2019-06-24 DIAGNOSIS — I25119 Atherosclerotic heart disease of native coronary artery with unspecified angina pectoris: Secondary | ICD-10-CM

## 2019-06-24 DIAGNOSIS — Z91040 Latex allergy status: Secondary | ICD-10-CM

## 2019-06-24 DIAGNOSIS — Z91041 Radiographic dye allergy status: Secondary | ICD-10-CM

## 2019-06-24 DIAGNOSIS — I739 Peripheral vascular disease, unspecified: Secondary | ICD-10-CM | POA: Diagnosis not present

## 2019-06-24 DIAGNOSIS — R0902 Hypoxemia: Secondary | ICD-10-CM | POA: Diagnosis present

## 2019-06-24 LAB — BRAIN NATRIURETIC PEPTIDE: B Natriuretic Peptide: 887 pg/mL — ABNORMAL HIGH (ref 0.0–100.0)

## 2019-06-24 LAB — CBC WITH DIFFERENTIAL/PLATELET
Abs Immature Granulocytes: 0.05 10*3/uL (ref 0.00–0.07)
Basophils Absolute: 0.1 10*3/uL (ref 0.0–0.1)
Basophils Relative: 1 %
Eosinophils Absolute: 1 10*3/uL — ABNORMAL HIGH (ref 0.0–0.5)
Eosinophils Relative: 10 %
HCT: 42.6 % (ref 39.0–52.0)
Hemoglobin: 14.2 g/dL (ref 13.0–17.0)
Immature Granulocytes: 1 %
Lymphocytes Relative: 8 %
Lymphs Abs: 0.9 10*3/uL (ref 0.7–4.0)
MCH: 28.5 pg (ref 26.0–34.0)
MCHC: 33.3 g/dL (ref 30.0–36.0)
MCV: 85.4 fL (ref 80.0–100.0)
Monocytes Absolute: 0.7 10*3/uL (ref 0.1–1.0)
Monocytes Relative: 7 %
Neutro Abs: 7.9 10*3/uL — ABNORMAL HIGH (ref 1.7–7.7)
Neutrophils Relative %: 73 %
Platelets: 265 10*3/uL (ref 150–400)
RBC: 4.99 MIL/uL (ref 4.22–5.81)
RDW: 12.8 % (ref 11.5–15.5)
WBC: 10.7 10*3/uL — ABNORMAL HIGH (ref 4.0–10.5)
nRBC: 0 % (ref 0.0–0.2)

## 2019-06-24 LAB — BASIC METABOLIC PANEL
Anion gap: 9 (ref 5–15)
BUN: 30 mg/dL — ABNORMAL HIGH (ref 8–23)
CO2: 26 mmol/L (ref 22–32)
Calcium: 8.8 mg/dL — ABNORMAL LOW (ref 8.9–10.3)
Chloride: 99 mmol/L (ref 98–111)
Creatinine, Ser: 1.4 mg/dL — ABNORMAL HIGH (ref 0.61–1.24)
GFR calc Af Amer: 54 mL/min — ABNORMAL LOW (ref >60)
GFR calc non Af Amer: 46 mL/min — ABNORMAL LOW (ref >60)
Glucose, Bld: 526 mg/dL (ref 70–99)
Potassium: 4.1 mmol/L (ref 3.5–5.1)
Sodium: 134 mmol/L — ABNORMAL LOW (ref 135–145)

## 2019-06-24 LAB — PROTIME-INR
INR: 1.1 (ref 0.8–1.2)
Prothrombin Time: 14.5 seconds (ref 11.4–15.2)

## 2019-06-24 LAB — GLUCOSE, CAPILLARY
Glucose-Capillary: 331 mg/dL — ABNORMAL HIGH (ref 70–99)
Glucose-Capillary: 263 mg/dL — ABNORMAL HIGH (ref 70–99)
Glucose-Capillary: 297 mg/dL — ABNORMAL HIGH (ref 70–99)
Glucose-Capillary: 299 mg/dL — ABNORMAL HIGH (ref 70–99)
Glucose-Capillary: 206 mg/dL — ABNORMAL HIGH (ref 70–99)
Glucose-Capillary: 108 mg/dL — ABNORMAL HIGH (ref 70–99)
Glucose-Capillary: 193 mg/dL — ABNORMAL HIGH (ref 70–99)

## 2019-06-24 LAB — RESPIRATORY PANEL BY RT PCR (FLU A&B, COVID)
Influenza A by PCR: NEGATIVE
Influenza B by PCR: NEGATIVE
SARS Coronavirus 2 by RT PCR: NEGATIVE

## 2019-06-24 LAB — HEMOGLOBIN A1C
Hgb A1c MFr Bld: 9.4 % — ABNORMAL HIGH (ref 4.8–5.6)
Mean Plasma Glucose: 223.08 mg/dL

## 2019-06-24 LAB — TROPONIN I (HIGH SENSITIVITY)
Troponin I (High Sensitivity): 34 ng/L — ABNORMAL HIGH (ref <18)
Troponin I (High Sensitivity): 62 ng/L — ABNORMAL HIGH (ref <18)
Troponin I (High Sensitivity): 116 ng/L (ref <18)

## 2019-06-24 LAB — HEPARIN LEVEL (UNFRACTIONATED)
Heparin Unfractionated: <0.1 IU/mL — ABNORMAL LOW (ref 0.30–0.70)
Heparin Unfractionated: 0.17 IU/mL — ABNORMAL LOW (ref 0.30–0.70)

## 2019-06-24 LAB — APTT: aPTT: 30 seconds (ref 24–36)

## 2019-06-24 SURGERY — CORONARY/GRAFT ACUTE MI REVASCULARIZATION
Anesthesia: Moderate Sedation

## 2019-06-24 MED ORDER — DIPHENHYDRAMINE HCL 50 MG/ML IJ SOLN
INTRAMUSCULAR | Status: AC
  Filled 2019-06-24: qty 1

## 2019-06-24 MED ORDER — ONDANSETRON HCL 4 MG/2ML IJ SOLN
4.0000 mg | Freq: Four times a day (QID) | INTRAMUSCULAR | Status: DC | PRN
  Filled 2019-06-24: qty 2

## 2019-06-24 MED ORDER — HEPARIN (PORCINE) IN NACL 1000-0.9 UT/500ML-% IV SOLN
INTRAVENOUS | Status: AC
  Filled 2019-06-24: qty 1000

## 2019-06-24 MED ORDER — ENSURE ENLIVE PO LIQD
237.0000 mL | Freq: Two times a day (BID) | ORAL | Status: DC
  Administered 2019-06-25 – 2019-06-26 (×2): 237 mL via ORAL

## 2019-06-24 MED ORDER — SODIUM CHLORIDE 0.9 % IV SOLN: 250.0000 mL | INTRAVENOUS | Status: DC | PRN

## 2019-06-24 MED ORDER — LABETALOL HCL 5 MG/ML IV SOLN
10.0000 mg | INTRAVENOUS | Status: DC | PRN
  Administered 2019-06-25: 10 mg via INTRAVENOUS
  Filled 2019-06-24: qty 4

## 2019-06-24 MED ORDER — ASPIRIN EC 325 MG PO TBEC: 325.0000 mg | DELAYED_RELEASE_TABLET | Freq: Every day | ORAL | Status: DC

## 2019-06-24 MED ORDER — METHYLPREDNISOLONE SODIUM SUCC 125 MG IJ SOLR
INTRAMUSCULAR | Status: AC
  Filled 2019-06-24: qty 2

## 2019-06-24 MED ORDER — SODIUM CHLORIDE 0.9 % WEIGHT BASED INFUSION: 3.0000 mL/kg/h | INTRAVENOUS | Status: AC

## 2019-06-24 MED ORDER — ASPIRIN 300 MG RE SUPP: 300.0000 mg | RECTAL | Status: AC

## 2019-06-24 MED ORDER — METOPROLOL SUCCINATE ER 50 MG PO TB24
25.0000 mg | ORAL_TABLET | Freq: Every day | ORAL | Status: DC
  Administered 2019-06-24: 03:00:00 25 mg via ORAL
  Filled 2019-06-24: qty 1

## 2019-06-24 MED ORDER — SODIUM CHLORIDE 0.9% FLUSH: 3.0000 mL | INTRAVENOUS | Status: DC | PRN

## 2019-06-24 MED ORDER — INSULIN ASPART 100 UNIT/ML ~~LOC~~ SOLN
0.0000 [IU] | SUBCUTANEOUS | Status: DC
  Administered 2019-06-24: 15:00:00 7 [IU] via SUBCUTANEOUS
  Administered 2019-06-24: 13:00:00 11 [IU] via SUBCUTANEOUS
  Filled 2019-06-24 (×2): qty 1

## 2019-06-24 MED ORDER — SODIUM CHLORIDE 0.9% FLUSH
3.0000 mL | Freq: Two times a day (BID) | INTRAVENOUS | Status: DC
  Administered 2019-06-24 – 2019-06-26 (×3): 3 mL via INTRAVENOUS

## 2019-06-24 MED ORDER — DILTIAZEM HCL ER COATED BEADS 120 MG PO CP24
120.0000 mg | ORAL_CAPSULE | Freq: Once | ORAL | Status: AC
  Administered 2019-06-24: 03:00:00 120 mg via ORAL
  Filled 2019-06-24: qty 1

## 2019-06-24 MED ORDER — HEPARIN BOLUS VIA INFUSION
4000.0000 [IU] | Freq: Once | INTRAVENOUS | Status: AC
  Administered 2019-06-24: 09:00:00 4000 [IU] via INTRAVENOUS
  Filled 2019-06-24: qty 4000

## 2019-06-24 MED ORDER — POTASSIUM CHLORIDE 20 MEQ PO PACK: 20.0000 meq | PACK | Freq: Two times a day (BID) | ORAL | Status: DC

## 2019-06-24 MED ORDER — ASPIRIN 81 MG PO CHEW: 324.0000 mg | CHEWABLE_TABLET | ORAL | Status: AC

## 2019-06-24 MED ORDER — GABAPENTIN 300 MG PO CAPS
300.0000 mg | ORAL_CAPSULE | Freq: Three times a day (TID) | ORAL | Status: DC
  Administered 2019-06-24 – 2019-06-26 (×5): 300 mg via ORAL
  Filled 2019-06-24 (×6): qty 1

## 2019-06-24 MED ORDER — INSULIN ASPART 100 UNIT/ML ~~LOC~~ SOLN
5.0000 [IU] | Freq: Once | SUBCUTANEOUS | Status: AC
  Administered 2019-06-24: 03:00:00 5 [IU] via INTRAVENOUS
  Filled 2019-06-24: qty 1

## 2019-06-24 MED ORDER — HEPARIN (PORCINE) 25000 UT/250ML-% IV SOLN
1250.0000 [IU]/h | INTRAVENOUS | Status: DC
  Administered 2019-06-24: 21:00:00 1250 [IU]/h via INTRAVENOUS
  Administered 2019-06-24: 06:00:00 1000 [IU]/h via INTRAVENOUS
  Filled 2019-06-24: qty 250

## 2019-06-24 MED ORDER — INSULIN ASPART 100 UNIT/ML IV SOLN
3.0000 [IU] | Freq: Once | INTRAVENOUS | Status: AC
  Administered 2019-06-24: 08:00:00 3 [IU] via INTRAVENOUS
  Filled 2019-06-24: qty 0.03

## 2019-06-24 MED ORDER — ACETAMINOPHEN 325 MG PO TABS
650.0000 mg | ORAL_TABLET | ORAL | Status: DC | PRN
  Administered 2019-06-25: 650 mg via ORAL

## 2019-06-24 MED ORDER — FAMOTIDINE 20 MG PO TABS
40.0000 mg | ORAL_TABLET | Freq: Once | ORAL | Status: AC | PRN
  Administered 2019-06-25: 40 mg via ORAL

## 2019-06-24 MED ORDER — HEPARIN (PORCINE) 25000 UT/250ML-% IV SOLN
INTRAVENOUS | Status: AC
  Filled 2019-06-24: qty 250

## 2019-06-24 MED ORDER — ASPIRIN EC 81 MG PO TBEC: 81.0000 mg | DELAYED_RELEASE_TABLET | Freq: Every day | ORAL | Status: DC

## 2019-06-24 MED ORDER — ATROPINE SULFATE 1 MG/10ML IJ SOSY
PREFILLED_SYRINGE | INTRAMUSCULAR | Status: AC
  Filled 2019-06-24: qty 10

## 2019-06-24 MED ORDER — ISOSORBIDE MONONITRATE ER 60 MG PO TB24
60.0000 mg | ORAL_TABLET | Freq: Every day | ORAL | Status: DC
  Administered 2019-06-24: 03:00:00 60 mg via ORAL
  Filled 2019-06-24: qty 1

## 2019-06-24 MED ORDER — AMIODARONE HCL 200 MG PO TABS
200.0000 mg | ORAL_TABLET | Freq: Every day | ORAL | Status: DC
  Administered 2019-06-24 – 2019-06-26 (×3): 200 mg via ORAL
  Filled 2019-06-24 (×4): qty 1

## 2019-06-24 MED ORDER — LACTATED RINGERS IV BOLUS
1000.0000 mL | Freq: Once | INTRAVENOUS | Status: AC
  Administered 2019-06-24: 03:00:00 1000 mL via INTRAVENOUS

## 2019-06-24 MED ORDER — INSULIN GLARGINE 100 UNIT/ML ~~LOC~~ SOLN
15.0000 [IU] | Freq: Every day | SUBCUTANEOUS | Status: DC
  Administered 2019-06-24 – 2019-06-25 (×2): 15 [IU] via SUBCUTANEOUS
  Filled 2019-06-24 (×3): qty 0.15

## 2019-06-24 MED ORDER — MORPHINE SULFATE (PF) 2 MG/ML IV SOLN
2.0000 mg | INTRAVENOUS | Status: DC | PRN
  Administered 2019-06-25: 2 mg via INTRAVENOUS
  Filled 2019-06-24: qty 1

## 2019-06-24 MED ORDER — ADULT MULTIVITAMIN W/MINERALS CH
1.0000 | ORAL_TABLET | Freq: Every day | ORAL | Status: DC
  Administered 2019-06-24 – 2019-06-26 (×3): 1 via ORAL
  Filled 2019-06-24 (×4): qty 1

## 2019-06-24 MED ORDER — ASPIRIN 81 MG PO CHEW
81.0000 mg | CHEWABLE_TABLET | ORAL | Status: AC
  Administered 2019-06-25: 81 mg via ORAL
  Filled 2019-06-24: qty 1

## 2019-06-24 MED ORDER — METOPROLOL TARTRATE 25 MG PO TABS
25.0000 mg | ORAL_TABLET | Freq: Three times a day (TID) | ORAL | Status: DC
  Administered 2019-06-24 – 2019-06-26 (×4): 25 mg via ORAL
  Filled 2019-06-24 (×6): qty 1

## 2019-06-24 MED ORDER — NITROGLYCERIN 0.4 MG SL SUBL
0.4000 mg | SUBLINGUAL_TABLET | SUBLINGUAL | Status: AC | PRN
  Administered 2019-06-25 (×3): 0.4 mg via SUBLINGUAL
  Filled 2019-06-24: qty 1

## 2019-06-24 MED ORDER — RIVAROXABAN 15 MG PO TABS: 15.0000 mg | ORAL_TABLET | Freq: Every day | ORAL | Status: DC

## 2019-06-24 MED ORDER — METOPROLOL SUCCINATE ER 50 MG PO TB24: 25.0000 mg | ORAL_TABLET | Freq: Every day | ORAL | Status: DC

## 2019-06-24 MED ORDER — ISOSORBIDE MONONITRATE ER 60 MG PO TB24
60.0000 mg | ORAL_TABLET | Freq: Every day | ORAL | Status: DC
  Administered 2019-06-24 – 2019-06-26 (×2): 60 mg via ORAL
  Filled 2019-06-24 (×4): qty 1

## 2019-06-24 MED ORDER — HEPARIN BOLUS VIA INFUSION
2400.0000 [IU] | Freq: Once | INTRAVENOUS | Status: AC
  Administered 2019-06-24: 17:00:00 2400 [IU] via INTRAVENOUS
  Filled 2019-06-24: qty 2400

## 2019-06-24 MED ORDER — SODIUM CHLORIDE 0.9 % IV SOLN: INTRAVENOUS | Status: DC

## 2019-06-24 MED ORDER — CHLORTHALIDONE 25 MG PO TABS: 25.0000 mg | ORAL_TABLET | Freq: Every day | ORAL | Status: DC

## 2019-06-24 MED ORDER — ZINC SULFATE 220 (50 ZN) MG PO CAPS
220.0000 mg | ORAL_CAPSULE | Freq: Every day | ORAL | Status: DC
  Administered 2019-06-24 – 2019-06-26 (×2): 220 mg via ORAL
  Filled 2019-06-24 (×4): qty 1

## 2019-06-24 MED ORDER — ATORVASTATIN CALCIUM 80 MG PO TABS
80.0000 mg | ORAL_TABLET | Freq: Every evening | ORAL | Status: DC
  Administered 2019-06-25: 19:00:00 80 mg via ORAL
  Filled 2019-06-24 (×2): qty 1

## 2019-06-24 MED ORDER — INSULIN ASPART 100 UNIT/ML ~~LOC~~ SOLN
0.0000 [IU] | Freq: Three times a day (TID) | SUBCUTANEOUS | Status: DC
  Administered 2019-06-25: 4 [IU] via SUBCUTANEOUS
  Filled 2019-06-24: qty 1

## 2019-06-24 MED ORDER — DILTIAZEM HCL ER COATED BEADS 120 MG PO CP24
120.0000 mg | ORAL_CAPSULE | Freq: Every day | ORAL | Status: DC
  Administered 2019-06-24 – 2019-06-26 (×3): 120 mg via ORAL
  Filled 2019-06-24 (×4): qty 1

## 2019-06-24 MED ORDER — SODIUM CHLORIDE 0.9 % WEIGHT BASED INFUSION: 1.0000 mL/kg/h | INTRAVENOUS | Status: DC

## 2019-06-24 MED ORDER — NITROGLYCERIN 0.4 MG SL SUBL: 0.4000 mg | SUBLINGUAL_TABLET | SUBLINGUAL | Status: DC | PRN

## 2019-06-24 MED ORDER — FUROSEMIDE 10 MG/ML IJ SOLN
40.0000 mg | Freq: Two times a day (BID) | INTRAMUSCULAR | Status: DC
  Administered 2019-06-24 – 2019-06-26 (×5): 40 mg via INTRAVENOUS
  Filled 2019-06-24 (×5): qty 4

## 2019-06-24 MED ORDER — VERAPAMIL HCL 2.5 MG/ML IV SOLN
INTRAVENOUS | Status: AC
  Filled 2019-06-24: qty 2

## 2019-06-24 NOTE — ED Notes (Signed)
Critical glucose of 526 called from lab. Dr. Charna Archer notified, no new verbal orders received.

## 2019-06-24 NOTE — ED Notes (Signed)
Per dr. Saralyn Pilar, give cardiazem cd 120mg , imdur mononitrate 60mg , and toprol-xl 25mg  now.

## 2019-06-24 NOTE — Progress Notes (Signed)
Code Stemi 1:50 a.m. Patient arrive approx 10 mins later by EMS. Patient was awake and alert. Medical team active with patient. No family member accompanied him, his wife does not drive at night and Covid restrictions. I prayed from outside for patient and staff.

## 2019-06-24 NOTE — Progress Notes (Signed)
[facility] Cardiology  SUBJECTIVE: Patient laying in bed, denies chest pain   Vitals:   06/24/19 0700 06/24/19 0711 06/24/19 0900 06/24/19 0915  BP: (!) 179/79  (!) 174/80   Pulse: 61 64 (!) 58 (!) 56  Resp:      Temp:      TempSrc:      SpO2: (!) 89% 93% 91% 95%  Weight:      Height:         Intake/Output Summary (Last 24 hours) at 06/24/2019 0953 Last data filed at 06/24/2019 0420 Gross per 24 hour  Intake 1000 ml  Output --  Net 1000 ml      PHYSICAL EXAM  General: Well developed, well nourished, in no acute distress HEENT:  Normocephalic and atramatic Neck:  No JVD.  Lungs: Clear bilaterally to auscultation and percussion. Heart: HRRR . Normal S1 and S2 without gallops or murmurs.  Abdomen: Bowel sounds are positive, abdomen soft and non-tender  Msk:  Back normal, normal gait. Normal strength and tone for age. Extremities: No clubbing, cyanosis or edema.   Neuro: Alert and oriented X 3. Psych:  Good affect, responds appropriately   LABS: Basic Metabolic Panel: Recent Labs    06/24/19 0205  NA 134*  K 4.1  CL 99  CO2 26  GLUCOSE 526*  BUN 30*  CREATININE 1.40*  CALCIUM 8.8*   Liver Function Tests: No results for input(s): AST, ALT, ALKPHOS, BILITOT, PROT, ALBUMIN in the last 72 hours. No results for input(s): LIPASE, AMYLASE in the last 72 hours. CBC: Recent Labs    06/24/19 0205  WBC 10.7*  NEUTROABS 7.9*  HGB 14.2  HCT 42.6  MCV 85.4  PLT 265   Cardiac Enzymes: No results for input(s): CKTOTAL, CKMB, CKMBINDEX, TROPONINI in the last 72 hours. BNP: Invalid input(s): POCBNP D-Dimer: No results for input(s): DDIMER in the last 72 hours. Hemoglobin A1C: No results for input(s): HGBA1C in the last 72 hours. Fasting Lipid Panel: No results for input(s): CHOL, HDL, LDLCALC, TRIG, CHOLHDL, LDLDIRECT in the last 72 hours. Thyroid Function Tests: No results for input(s): TSH, T4TOTAL, T3FREE, THYROIDAB in the last 72 hours.  Invalid input(s):  FREET3 Anemia Panel: No results for input(s): VITAMINB12, FOLATE, FERRITIN, TIBC, IRON, RETICCTPCT in the last 72 hours.  DG Chest Portable 1 View  Result Date: 06/24/2019 CLINICAL DATA:  Chest pain, code STEMI EXAM: PORTABLE CHEST 1 VIEW COMPARISON:  Radiograph 02/08/2019 FINDINGS: Diffuse hazy interstitial opacities with redistribution of the pulmonary vascularity, fissural and septal thickening and cardiomegaly. Chronic areas of scarring are noted in the right lung apex. The aorta is calcified. No visible pneumothorax or effusion. No acute osseous or soft tissue abnormality. Defibrillator pads and telemetry leads overlie the chest. IMPRESSION: Features compatible with heart failure and/or volume overload with pulmonary edema and cardiomegaly. No visible effusions. Chronic areas of scarring particularly towards the lung apices. Electronically Signed   By: Lovena Le M.D.   On: 06/24/2019 03:05     Echo pending  TELEMETRY: Sinus rhythm:  ASSESSMENT AND PLAN:  Active Problems:   ACS (acute coronary syndrome) ([place])    1.  Unstable angina, borderline elevated high-sensitivity troponin (34, 62), nondiagnostic ECG with Q waves, and chronic ST elevation in leads V1 and V2 2.  Coronary artery disease, cardiac catheterization 06/05/2018 revealing occluded ostial LAD, and 70% stenosis proximal RCA 3.  Essential hypertension 4.  Peripheral vascular disease, status post bilateral amputation 5.  Chronic kidney disease 6.  Contrast allergy  Recommendations  1.  Agree with current therapy 2.  Continue heparin drip 3.  Hold Xarelto 4.  Review 2D echocardiogram 5.  Cardiac catheterization with selective coronary arteriography scheduled for 06/25/2019.  The risk, benefits and alternatives of cardiac catheterization and possible PCI were explained to the patient and informed written consent was obtained.   Isaias Cowman, MD, PhD, [facility] 06/24/2019 9:53 AM

## 2019-06-24 NOTE — ED Notes (Signed)
Report received from April, RN.  

## 2019-06-24 NOTE — ED Notes (Signed)
Pt given breakfast tray

## 2019-06-24 NOTE — Progress Notes (Signed)
Brief hospitalist update note. Nonbillable note, please see same day H&P for full billable details  Briefly this is a 85 year old Caucasian male with history of known coronary artery disease with known occlusion of left anterior descending artery from previous catheterization, atrial fibrillation, hypertension, type 2 diabetes, hyperlipidemia, peripheral vascular disease status post bilateral lower extremity amputations who presented to the emergency department with acute onset of midsternal chest pain.  Initial EKG concerning for deep Q waves in lead V1 and V2 with notable ST elevation.  Code STEMI was initiated at this time.  Cardiology responded to bedside.  Code STEMI canceled after review by cardiologist.  Patient remains on heparin infusion for presumed non-ST elevation myocardial infarction.  Echocardiogram is pending.  Cardiology to follow-up on results.  Exacerbation of heart failure is certainly a possibility.  Patient mildly hypoxic requiring 2 L nasal cannula.  Patient remains hypertensive.  We will continue diuresis at this time.  Case discussed with Dr. Saralyn Pilar.  We will continue therapeutic anticoagulation for now.  Plan for inpatient cardiac catheterization on 06/25/2019.  Ralene Muskrat MD

## 2019-06-24 NOTE — ED Notes (Signed)
Lab here for venipuncture assist.  

## 2019-06-24 NOTE — H&P (Signed)
[place] [facility] [place] NAME: [name]    MR#:  [id number]  DATE OF BIRTH:  Mar 02, 1935  DATE OF ADMISSION:  06/24/2019  PRIMARY CARE PHYSICIAN: Corey Skains, MD   REQUESTING/REFERRING PHYSICIAN: Blake Divine, MD  CHIEF COMPLAINT:   Chief Complaint  Patient presents with  . Code STEMI    HISTORY OF PRESENT ILLNESS:  [name]  is a 85 y.o. pleasant Caucasian male with a known history of multiple medical problems that are mentioned below, including type 2 diabetes mellitus, CAD, hypertension, atrial fibrillation, dyslipidemia and peripheral vascular disease, presented to the emergency room with acute onset of midsternal chest pain felt as tightness and graded 6-8/10 in severity with no nausea or vomiting or diaphoresis or radiation.  He denied any cough or wheezing or dyspnea or hemoptysis.  No leg pain or edema recent travels or surgeries.  He admitted to polyuria and polydipsia.  He denies any fever or chills.  No COVID-19 exposure.  Initial EKG by EMS revealed Q waves in lead V1 and V2 with ST elevation.  That time the patient arrived to the ER he was pain-free with resolution of his ST segment changes.  His last heart catheterization on 06/05/2018 revealed occluded ostial LAD with 70% stenosis in proximal RCA.  At that time his EF was 45 to 50% on a 2D echo.  Upon presentation to the emergency room, blood pressure was 174/85 with respiratory rate 26 and pulse oximetry of 88% on room air and 92% on 2 L of O2 by nasal cannula later 95%.  Labs revealed a blood glucose of 526 and a BUN of 30 with creatinine 1.4 up from 31/1.02 in November of last year.  High-sensitivity troponin I was 34 and later 62.  CBC showed WBC of 10.7 with mild neutrophilia.  Influenza AMB antigens and COVID-19 PCR came back negative. Chest x-ray showed showed interstitial pulmonary edema and cardiomegaly consistent with acute CHF.  It also showed chronic areas of scarring  towards the lung apices.  Dr. Saralyn Pilar was consulted and he reviewed EKGs here.  The latest showed normal sinus rhythm with rate of 73 with PACs, Q waves in V1 and V2 and T wave inversion laterally  prolonged QT interval.  The patient was given 4 baby aspirin by EMS and here received 5 units of IV NovoLog for hyperglycemia and 60 mg of p.o. imager as well as 1 L of lactated Ringer and Toprol-XL 25 mg p.o. with increased troponin I he was started on IV heparin.  He is currently pain-free.  He will be admitted to telemetry bed for further evaluation and management PAST MEDICAL HISTORY:   Past Medical History:  Diagnosis Date  . Arthritis   . Atrial fibrillation ([place])   . Carcinoma of prostate ([place])   . CHF (congestive heart failure) ([place])   . Chronic kidney disease   . Diabetes mellitus without complication ([place])   . ED (erectile dysfunction)   . Frequent urination   . Hyperlipidemia   . Hypertension   . Moderate mitral insufficiency   . Peripheral vascular disease ([place])   . Pneumonia 04/2016  . Prostate cancer ([place])   . Sleep apnea    OSA--USE C-PAP  . Ulcer of left foot due to type 2 diabetes mellitus ([place])   . Urinary stress incontinence, male     PAST SURGICAL HISTORY:   Past Surgical History:  Procedure Laterality Date  . AMPUTATION Left 01/12/2017  Procedure: AMPUTATION BELOW KNEE;  Surgeon: Algernon Huxley, MD;  Location: [facility] ORS;  Service: General;  Laterality: Left;  . AMPUTATION Right 12/27/2018   Procedure: AMPUTATION BELOW KNEE;  Surgeon: Algernon Huxley, MD;  Location: [facility] ORS;  Service: General;  Laterality: Right;  . APLIGRAFT PLACEMENT Left 10/15/2016   Procedure: APLIGRAFT PLACEMENT;  Surgeon: Albertine Patricia, DPM;  Location: [facility] ORS;  Service: Podiatry;  Laterality: Left;  necrotic ulcer  . CHOLECYSTECTOMY    . COLONOSCOPY WITH PROPOFOL N/A 01/02/2019   Procedure: COLONOSCOPY WITH PROPOFOL;  Surgeon: Lucilla Lame, MD;  Location: [facility] ENDOSCOPY;  Service: Endoscopy;   Laterality: N/A;  . COLONOSCOPY WITH PROPOFOL N/A 01/05/2019   Procedure: COLONOSCOPY WITH PROPOFOL;  Surgeon: Lucilla Lame, MD;  Location: [facility] ENDOSCOPY;  Service: Endoscopy;  Laterality: N/A;  . ESOPHAGOGASTRODUODENOSCOPY (EGD) WITH PROPOFOL N/A 01/02/2019   Procedure: ESOPHAGOGASTRODUODENOSCOPY (EGD) WITH PROPOFOL;  Surgeon: Lucilla Lame, MD;  Location: [facility] ENDOSCOPY;  Service: Endoscopy;  Laterality: N/A;  . EYE SURGERY Bilateral    Cataract Extraction with IOL  . HIP ARTHROPLASTY Right 10/13/2018   Procedure: ARTHROPLASTY BIPOLAR HIP (HEMIARTHROPLASTY);  Surgeon: Earnestine Leys, MD;  Location: [facility] ORS;  Service: Orthopedics;  Laterality: Right;  . IRRIGATION AND DEBRIDEMENT FOOT Left 10/15/2016   Procedure: IRRIGATION AND DEBRIDEMENT FOOT-EXCISIONAL DEBRIDEMENT OF SKIN 3RD, 4TH AND 5TH TOES WITH APPLICATION OF APLIGRAFT;  Surgeon: Albertine Patricia, DPM;  Location: [facility] ORS;  Service: Podiatry;  Laterality: Left;  necrotic,gangrene  . IRRIGATION AND DEBRIDEMENT FOOT Left 12/09/2016   Procedure: IRRIGATION AND DEBRIDEMENT FOOT-MUSCLE/FASCIA, RESECTION OF FOURTH AND FIFTH METATARSAL NECROTIC BONE;  Surgeon: Albertine Patricia, DPM;  Location: [facility] ORS;  Service: Podiatry;  Laterality: Left;  . IRRIGATION AND DEBRIDEMENT FOOT Right 12/23/2018   Procedure: IRRIGATION AND DEBRIDEMENT FOOT and wound vac application;  Surgeon: Samara Deist, DPM;  Location: [facility] ORS;  Service: Podiatry;  Laterality: Right;  . LEFT HEART CATH AND CORONARY ANGIOGRAPHY N/A 06/05/2018   Procedure: LEFT HEART CATH AND CORONARY ANGIOGRAPHY;  Surgeon: Yolonda Kida, MD;  Location: [place] CV LAB;  Service: Cardiovascular;  Laterality: N/A;  . LOWER EXTREMITY ANGIOGRAPHY Left 08/16/2016   Procedure: Lower Extremity Angiography;  Surgeon: Algernon Huxley, MD;  Location: [place] CV LAB;  Service: Cardiovascular;  Laterality: Left;  . LOWER EXTREMITY ANGIOGRAPHY Left 09/01/2016   Procedure: Lower Extremity Angiography;   Surgeon: Algernon Huxley, MD;  Location: [place] [facility] LAB;  Service: Cardiovascular;  Laterality: Left;  . LOWER EXTREMITY ANGIOGRAPHY Left 10/14/2016   Procedure: Lower Extremity Angiography;  Surgeon: Algernon Huxley, MD;  Location: [place] CV LAB;  Service: Cardiovascular;  Laterality: Left;  . LOWER EXTREMITY ANGIOGRAPHY Left 12/20/2016   Procedure: Lower Extremity Angiography;  Surgeon: Algernon Huxley, MD;  Location: [place] CV LAB;  Service: Cardiovascular;  Laterality: Left;  . LOWER EXTREMITY ANGIOGRAPHY Right 12/18/2018   Procedure: LOWER EXTREMITY ANGIOGRAPHY;  Surgeon: Algernon Huxley, MD;  Location: [place] CV LAB;  Service: Cardiovascular;  Laterality: Right;  . LOWER EXTREMITY INTERVENTION  09/01/2016   Procedure: Lower Extremity Intervention;  Surgeon: Algernon Huxley, MD;  Location: [place] CV LAB;  Service: Cardiovascular;;  . PROSTATE SURGERY     removal  . TONSILLECTOMY     as a child    SOCIAL HISTORY:   Social History   Tobacco Use  . Smoking status: Former Smoker    Packs/day: 0.25    Types: Cigarettes    Quit date: 10/14/1994  Years since quitting: 24.7  . Smokeless tobacco: Never Used  Substance Use Topics  . Alcohol use: No    Alcohol/week: 0.0 standard drinks    FAMILY HISTORY:   Family History  Problem Relation Age of Onset  . Hypertension Mother   . Diabetes Mother   . Prostate cancer Neg Hx   . Bladder Cancer Neg Hx   . Kidney disease Neg Hx     DRUG ALLERGIES:   Allergies  Allergen Reactions  . Contrast Media [Iodinated Diagnostic Agents] Shortness Of Breath    SOB  . Iohexol Shortness Of Breath     Desc: Respiratory Distress, Laryngedema, diaphoresis, Onset Date: [id number]   . Metrizamide Shortness Of Breath    SOB SOB SOB   . Latex Rash    REVIEW OF SYSTEMS:   ROS As per history of present illness. All pertinent systems were reviewed above. Constitutional,  HEENT, cardiovascular, respiratory, GI, GU,  musculoskeletal, neuro, psychiatric, endocrine,  integumentary and hematologic systems were reviewed and are otherwise  negative/unremarkable except for positive findings mentioned above in the HPI.   MEDICATIONS AT HOME:   Prior to Admission medications   Medication Sig Start Date End Date Taking? Authorizing Provider  acetaminophen (TYLENOL) 325 MG tablet Take 650 mg by mouth every 6 (six) hours as needed.   Yes [provider]  amiodarone (PACERONE) 200 MG tablet Take 200 mg by mouth daily. 09/13/18  Yes [provider]  aspirin EC 81 MG tablet Take 1 tablet (81 mg total) by mouth daily. 12/18/18  Yes Dew, Erskine Squibb, MD  diltiazem (CARDIZEM CD) 120 MG 24 hr capsule Take 120 mg by mouth daily. 09/28/18  Yes [provider]  gabapentin (NEURONTIN) 300 MG capsule Take 300 mg by mouth 3 (three) times daily.  02/07/19  Yes [provider]  insulin glargine (LANTUS) 100 UNIT/ML injection Inject 0.15 mLs (15 Units total) into the skin at bedtime. 10/18/18  Yes [name] Mandes, MD  insulin lispro (HUMALOG) 100 UNIT/ML injection Inject 0.03 mLs (3 Units total) into the skin 3 (three) times daily with meals. Patient taking differently: Inject 3 Units into the skin 3 (three) times daily with meals. Plus 1 additional unit for every 50 BG points over 200 (max 5 additional units per dose) 11/28/17  Yes Wieting, Richard, MD  isosorbide mononitrate (IMDUR) 60 MG 24 hr tablet Take 1 tablet (60 mg total) by mouth daily. 04/06/19  Yes Arta Silence, MD  metoprolol succinate (TOPROL-XL) 25 MG 24 hr tablet Take 25 mg by mouth daily.   Yes [provider]  metoprolol tartrate (LOPRESSOR) 25 MG tablet Take 25 mg by mouth 3 (three) times daily. 04/08/19  Yes [provider]  Multiple Vitamin (MULTIVITAMIN WITH MINERALS) TABS tablet Take 1 tablet by mouth daily. 01/05/19  Yes [name], MD  nitroGLYCERIN (NITROSTAT) 0.4 MG SL tablet Place 1 tablet (0.4 mg total) under the  tongue every 5 (five) minutes as needed for chest pain. 06/09/18  Yes Hillary Bow, MD  Rivaroxaban (XARELTO) 15 MG TABS tablet Take 1 tablet (15 mg total) by mouth daily with supper. Hold Xarelto for 3 days.  Start on January 09, 2019. 01/09/19  Yes [name], MD  atorvastatin (LIPITOR) 80 MG tablet Take 80 mg by mouth every evening. 07/31/18   [provider]  bisacodyl (DULCOLAX) 5 MG EC tablet Take 1 tablet (5 mg total) by mouth daily as needed for moderate constipation. Patient not taking: Reported on 06/24/2019 01/05/19  [name], MD  chlorthalidone (HYGROTON) 25 MG tablet Take 25 mg by mouth daily.    [provider]  Cholecalciferol (VITAMIN D-3) 25 MCG (1000 UT) CAPS Take by mouth.    [provider]  collagenase (SANTYL) ointment Apply 1 application topically daily. Patient not taking: Reported on 06/24/2019 04/16/19   Kris Hartmann, NP  divalproex (DEPAKOTE ER) 500 MG 24 hr tablet Take 1 tablet (500 mg total) by mouth daily. Patient not taking: Reported on 06/24/2019 02/16/19   Charlynne Cousins, MD  doxycycline (VIBRAMYCIN) 100 MG capsule Take 100 mg by mouth 2 (two) times daily.    [provider]  Ensure Max Protein (ENSURE MAX PROTEIN) LIQD Take 330 mLs (11 oz total) by mouth 2 (two) times daily. 01/05/19   [name], MD  ferrous sulfate 325 (65 FE) MG tablet Take 1 tablet (325 mg total) by mouth daily with breakfast. Patient not taking: Reported on 04/13/2019 10/18/18   [name] Mandes, MD  HYDROcodone-acetaminophen (NORCO/VICODIN) 5-325 MG tablet Take 1 tablet by mouth every 6 (six) hours as needed for moderate pain (pain score 4-6). Patient not taking: Reported on 06/24/2019 02/19/19   Charlynne Cousins, MD  metFORMIN (GLUCOPHAGE) 500 MG tablet Take by mouth daily.    [provider]  potassium chloride (KLOR-CON) 20 MEQ packet Take by mouth 2 (two) times daily.    [provider]  senna (SENOKOT) 8.6 MG TABS tablet Take 1 tablet  (8.6 mg total) by mouth daily as needed for mild constipation. Patient not taking: Reported on 06/24/2019 01/05/19   [name], MD  silver sulfADIAZINE (SILVADENE) 1 % cream Apply 1 application topically daily. Patient not taking: Reported on 06/24/2019 01/30/19   Kris Hartmann, NP  vitamin C (VITAMIN C) 250 MG tablet Take 1 tablet (250 mg total) by mouth 2 (two) times daily. Patient not taking: Reported on 06/24/2019 01/05/19   [name], MD  zinc sulfate 220 (50 Zn) MG capsule Take 220 mg by mouth daily.    [provider]      VITAL SIGNS:  Blood pressure (!) 173/77, pulse (!) 57, temperature 98.4 F (36.9 C), temperature source Oral, resp. rate 15, weight 79.4 kg, SpO2 92 %.  PHYSICAL EXAMINATION:  Physical Exam  GENERAL:  85 y.o.-year-old pleasant Caucasian male patient lying in the bed with no acute distress.  EYES: Pupils equal, round, reactive to light and accommodation. No scleral icterus. Extraocular muscles intact.  HEENT: Head atraumatic, normocephalic. Oropharynx and nasopharynx clear.  NECK:  Supple, no jugular venous distention. No thyroid enlargement, no tenderness.  LUNGS: Normal breath sounds bilaterally, no wheezing, rales,rhonchi or crepitation. No use of accessory muscles of respiration.  CARDIOVASCULAR: Regular rate and rhythm, S1, S2 normal. No murmurs, rubs, or gallops.  ABDOMEN: Soft, nondistended, nontender. Bowel sounds present. No organomegaly or mass.  EXTREMITIES: No pedal edema, cyanosis, or clubbing.  NEUROLOGIC: Cranial nerves II through XII are intact. Muscle strength 5/5 in all extremities. Sensation intact. Gait not checked.  PSYCHIATRIC: The patient is alert and oriented x 3.  Normal affect and good eye contact. SKIN: No obvious rash, lesion, or ulcer.   LABORATORY PANEL:   CBC Recent Labs  Lab 06/24/19 0205  WBC 10.7*  HGB 14.2  HCT 42.6  PLT 265    ------------------------------------------------------------------------------------------------------------------  Chemistries  Recent Labs  Lab 06/24/19 0205  NA 134*  K 4.1  CL 99  CO2 26  GLUCOSE 526*  BUN 30*  CREATININE  1.40*  CALCIUM 8.8*   ------------------------------------------------------------------------------------------------------------------  Cardiac Enzymes No results for input(s): TROPONINI in the last 168 hours. ------------------------------------------------------------------------------------------------------------------  RADIOLOGY:  DG Chest Portable 1 View  Result Date: 06/24/2019 CLINICAL DATA:  Chest pain, code STEMI EXAM: PORTABLE CHEST 1 VIEW COMPARISON:  Radiograph 02/08/2019 FINDINGS: Diffuse hazy interstitial opacities with redistribution of the pulmonary vascularity, fissural and septal thickening and cardiomegaly. Chronic areas of scarring are noted in the right lung apex. The aorta is calcified. No visible pneumothorax or effusion. No acute osseous or soft tissue abnormality. Defibrillator pads and telemetry leads overlie the chest. IMPRESSION: Features compatible with heart failure and/or volume overload with pulmonary edema and cardiomegaly. No visible effusions. Chronic areas of scarring particularly towards the lung apices. Electronically Signed   By: Lovena Le M.D.   On: 06/24/2019 03:05      IMPRESSION AND PLAN:   1.  Acute coronary syndrome.  The patient is currently in suspicious non-STEMI.  The patient will be admitted to telemetry bed.  We will follow serial troponin I.  We will continue him on IV heparin as well as aspirin beta-blocker therapy and continued statin therapy.  We will place him on as needed IV morphine sulfate sublingual nitroglycerin for pain.  We will obtain a 2D echo.  Cardiology consultation will be obtained by Dr. Saralyn Pilar who is aware about the patient.  2.  Nonketotic hyperglycemia with uncontrolled type 2  diabetes mellitus.  His blood glucose currently improving after IV NovoLog.  We will continue frequent fingerstick blood glucose measures and appropriate coverage with subcutaneous NovoLog.  We will continue basal coverage with Lantus.  3.  Acute on chronic diastolic CHF.  This is clearly secondary to #1.  The patient will be on gentle diuresis with IV Lasix.  4.  Paroxysmal atrial fibrillation.  We will continue amiodarone and and the patient will be on IV heparin.  5.  DVT prophylaxis.  The patient will be on IV heparin.  We will hold off Xarelto for now.  All the records are reviewed and case discussed with ED provider. The plan of care was discussed in details with the patient (and family). I answered all questions. The patient agreed to proceed with the above mentioned plan. Further management will depend upon hospital course.   CODE STATUS: Full code  TOTAL TIME TAKING CARE OF THIS PATIENT: 50 minutes.    Christel Mormon M.D on 06/24/2019 at 6:21 AM  [facility]   From 7 PM-7 AM, contact night-coverage [email or web address]  CC: Primary care physician; Corey Skains, MD   Note: This dictation was prepared with Dragon dictation along with smaller phrase technology. Any transcriptional errors that result from this process are unintentional.

## 2019-06-24 NOTE — ED Notes (Signed)
Pt states pain began around 2300 yesterday was 8/10, pt states has no pain currently. Pt is on defib pads, cardiac monitor. Pt with bilateral below knee amputations.

## 2019-06-24 NOTE — ED Provider Notes (Signed)
[facility] Emergency Department Provider Note   ____________________________________________   First MD Initiated Contact with Patient 06/24/19 0207     (approximate)  I have reviewed the triage vital signs and the nursing notes.   HISTORY  Chief Complaint Code STEMI    HPI [name] is a 85 y.o. male with past medical history of CAD, hypertension, diabetes, CHF, and atrial fibrillation on Xarelto who presents to the ED complaining of chest pain. Patient reports that he had sudden onset of central chest pressure while he was at rest around 11 PM this evening. He took 2 nitroglycerin and pain seemed to abate, however it returned an hour or so later. At that time, EMS was called and initial EKG was concerning for STEMI. He was given 2 additional nitroglycerin, aspirin load, and nitro paste was placed. He now states that the pain in his chest is resolved. He did not have any shortness of breath, nausea, or vomiting with this episode. He reports otherwise feeling well with no recent fevers, chills, cough.        Past Medical History:  Diagnosis Date  . Arthritis   . Atrial fibrillation ([place])   . Carcinoma of prostate ([place])   . CHF (congestive heart failure) ([place])   . Chronic kidney disease   . Diabetes mellitus without complication ([place])   . ED (erectile dysfunction)   . Frequent urination   . Hyperlipidemia   . Hypertension   . Moderate mitral insufficiency   . Peripheral vascular disease ([place])   . Pneumonia 04/2016  . Prostate cancer ([place])   . Sleep apnea    OSA--USE C-PAP  . Ulcer of left foot due to type 2 diabetes mellitus ([place])   . Urinary stress incontinence, male     Patient Active Problem List   Diagnosis Date Noted  . ACS (acute coronary syndrome) ([place]) 06/24/2019  . Paroxysmal A-fib ([place]) 03/27/2019  . Hx of BKA, right ([place]) 03/02/2019  . Severe protein-calorie malnutrition ([place]) 02/16/2019  . Acute systolic CHF  (congestive heart failure) ([place]) 02/16/2019  . Acute respiratory failure with hypoxia ([place]) 02/08/2019  . Pneumonia due to COVID-19 virus 02/08/2019  . Below-knee amputation of right lower extremity ([place]) 01/15/2019  . Polyp of descending colon   . Iron deficiency anemia due to chronic blood loss   . Sacral decubitus ulcer, stage II ([place]) 12/26/2018  . Acute respiratory failure ([place]) 12/20/2018  . Femur fracture ([place]) 10/13/2018  . Chest pain 06/03/2018  . NSTEMI (non-ST elevated myocardial infarction) ([place]) 01/16/2018  . Acute CHF (congestive heart failure) ([place]) 11/23/2017  . Atrial fibrillation with rapid ventricular response ([place]) 10/15/2017  . BKA stump complication ([place]) 0000000  . 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 ([place]) 02/10/2017  . S/P BKA (below knee amputation) unilateral, left ([place]) 02/01/2017  . Chronic diastolic heart failure ([place]) 01/21/2017  . Pressure injury of skin 01/18/2017  . Atherosclerosis of artery of extremity with gangrene ([place]) 01/05/2017  . Diabetic foot ulcer ([employer]) 12/29/2016  . Ataxia 10/21/2016  . Atherosclerosis of native arteries of the extremities with ulceration ([place]) 10/12/2016  . History of femoral angiogram 09/06/2016  . Left leg pain 09/06/2016  . Leukocytosis 09/06/2016  . Generalized weakness 09/06/2016  . Atrial fibrillation, chronic ([place]) 08/30/2016  . Pneumonia 05/19/2016  . Hyperlipidemia 04/20/2016  . Back pain 04/19/2016  . Type 2 diabetes mellitus treated with insulin ([place]) 04/19/2016  . Essential hypertension 04/19/2016  . PAD (peripheral artery  disease) ([place]) 04/19/2016  . Erectile dysfunction following radical prostatectomy 06/25/2015  . History of prostate cancer 12/23/2014  . Incontinence 12/23/2014    Past Surgical History:  Procedure Laterality Date  . AMPUTATION Left 01/12/2017   Procedure: AMPUTATION BELOW KNEE;  Surgeon: Algernon Huxley, MD;  Location: [facility] ORS;  Service: General;  Laterality: Left;   . AMPUTATION Right 12/27/2018   Procedure: AMPUTATION BELOW KNEE;  Surgeon: Algernon Huxley, MD;  Location: [facility] ORS;  Service: General;  Laterality: Right;  . APLIGRAFT PLACEMENT Left 10/15/2016   Procedure: APLIGRAFT PLACEMENT;  Surgeon: Albertine Patricia, DPM;  Location: [facility] ORS;  Service: Podiatry;  Laterality: Left;  necrotic ulcer  . CHOLECYSTECTOMY    . COLONOSCOPY WITH PROPOFOL N/A 01/02/2019   Procedure: COLONOSCOPY WITH PROPOFOL;  Surgeon: Lucilla Lame, MD;  Location: [facility] ENDOSCOPY;  Service: Endoscopy;  Laterality: N/A;  . COLONOSCOPY WITH PROPOFOL N/A 01/05/2019   Procedure: COLONOSCOPY WITH PROPOFOL;  Surgeon: Lucilla Lame, MD;  Location[facility] ENDOSCOPY;  Service: Endoscopy;  Laterality: N/A;  . ESOPHAGOGASTRODUODENOSCOPY (EGD) WITH PROPOFOL N/A 01/02/2019   Procedure: ESOPHAGOGASTRODUODENOSCOPY (EGD) WITH PROPOFOL;  Surgeon: Lucilla Lame, MD;  Location: [facility] ENDOSCOPY;  Service: Endoscopy;  Laterality: N/A;  . EYE SURGERY Bilateral    Cataract Extraction with IOL  . HIP ARTHROPLASTY Right 10/13/2018   Procedure: ARTHROPLASTY BIPOLAR HIP (HEMIARTHROPLASTY);  Surgeon: Earnestine Leys, MD;  Location: [facility] ORS;  Service: Orthopedics;  Laterality: Right;  . IRRIGATION AND DEBRIDEMENT FOOT Left 10/15/2016   Procedure: IRRIGATION AND DEBRIDEMENT FOOT-EXCISIONAL DEBRIDEMENT OF SKIN 3RD, 4TH AND 5TH TOES WITH APPLICATION OF APLIGRAFT;  Surgeon: Albertine Patricia, DPM;  Location: [facility] ORS;  Service: Podiatry;  Laterality: Left;  necrotic,gangrene  . IRRIGATION AND DEBRIDEMENT FOOT Left 12/09/2016   Procedure: IRRIGATION AND DEBRIDEMENT FOOT-MUSCLE/FASCIA, RESECTION OF FOURTH AND FIFTH METATARSAL NECROTIC BONE;  Surgeon: Albertine Patricia, DPM;  Location: [facility] ORS;  Service: Podiatry;  Laterality: Left;  . IRRIGATION AND DEBRIDEMENT FOOT Right 12/23/2018   Procedure: IRRIGATION AND DEBRIDEMENT FOOT and wound vac application;  Surgeon: Samara Deist, DPM;  Location: [facility] ORS;  Service: Podiatry;   Laterality: Right;  . LEFT HEART CATH AND CORONARY ANGIOGRAPHY N/A 06/05/2018   Procedure: LEFT HEART CATH AND CORONARY ANGIOGRAPHY;  Surgeon: Yolonda Kida, MD;  Location: [place] CV LAB;  Service: Cardiovascular;  Laterality: N/A;  . LOWER EXTREMITY ANGIOGRAPHY Left 08/16/2016   Procedure: Lower Extremity Angiography;  Surgeon: Algernon Huxley, MD;  Location: [place] CV LAB;  Service: Cardiovascular;  Laterality: Left;  . LOWER EXTREMITY ANGIOGRAPHY Left 09/01/2016   Procedure: Lower Extremity Angiography;  Surgeon: Algernon Huxley, MD;  Location: [place] CV LAB;  Service: Cardiovascular;  Laterality: Left;  . LOWER EXTREMITY ANGIOGRAPHY Left 10/14/2016   Procedure: Lower Extremity Angiography;  Surgeon: Algernon Huxley, MD;  Location: [place] CV LAB;  Service: Cardiovascular;  Laterality: Left;  . LOWER EXTREMITY ANGIOGRAPHY Left 12/20/2016   Procedure: Lower Extremity Angiography;  Surgeon: Algernon Huxley, MD;  Location: [place] CV LAB;  Service: Cardiovascular;  Laterality: Left;  . LOWER EXTREMITY ANGIOGRAPHY Right 12/18/2018   Procedure: LOWER EXTREMITY ANGIOGRAPHY;  Surgeon: Algernon Huxley, MD;  Location: [place] [facility] LAB;  Service: Cardiovascular;  Laterality: Right;  . LOWER EXTREMITY INTERVENTION  09/01/2016   Procedure: Lower Extremity Intervention;  Surgeon: Algernon Huxley, MD;  Location: [place] [facility] LAB;  Service: Cardiovascular;;  . PROSTATE SURGERY     removal  . TONSILLECTOMY  as a child    Prior to Admission medications   Medication Sig Start Date End Date Taking? Authorizing Provider  acetaminophen (TYLENOL) 325 MG tablet Take 650 mg by mouth every 6 (six) hours as needed.   Yes [provider]  amiodarone (PACERONE) 200 MG tablet Take 200 mg by mouth daily. 09/13/18  Yes [provider]  aspirin EC 81 MG tablet Take 1 tablet (81 mg total) by mouth daily. 12/18/18  Yes Dew, Erskine Squibb, MD  diltiazem (CARDIZEM CD) 120 MG 24 hr capsule  Take 120 mg by mouth daily. 09/28/18  Yes [provider]  gabapentin (NEURONTIN) 300 MG capsule Take 300 mg by mouth 3 (three) times daily.  02/07/19  Yes [provider]  insulin glargine (LANTUS) 100 UNIT/ML injection Inject 0.15 mLs (15 Units total) into the skin at bedtime. 10/18/18  Yes [name] Mandes, MD  insulin lispro (HUMALOG) 100 UNIT/ML injection Inject 0.03 mLs (3 Units total) into the skin 3 (three) times daily with meals. Patient taking differently: Inject 3 Units into the skin 3 (three) times daily with meals. Plus 1 additional unit for every 50 BG points over 200 (max 5 additional units per dose) 11/28/17  Yes Wieting, Richard, MD  isosorbide mononitrate (IMDUR) 60 MG 24 hr tablet Take 1 tablet (60 mg total) by mouth daily. 04/06/19  Yes Arta Silence, MD  metoprolol succinate (TOPROL-XL) 25 MG 24 hr tablet Take 25 mg by mouth daily.   Yes [provider]  metoprolol tartrate (LOPRESSOR) 25 MG tablet Take 25 mg by mouth 3 (three) times daily. 04/08/19  Yes [provider]  Multiple Vitamin (MULTIVITAMIN WITH MINERALS) TABS tablet Take 1 tablet by mouth daily. 01/05/19  Yes [name], MD  nitroGLYCERIN (NITROSTAT) 0.4 MG SL tablet Place 1 tablet (0.4 mg total) under the tongue every 5 (five) minutes as needed for chest pain. 06/09/18  Yes Hillary Bow, MD  Rivaroxaban (XARELTO) 15 MG TABS tablet Take 1 tablet (15 mg total) by mouth daily with supper. Hold Xarelto for 3 days.  Start on January 09, 2019. 01/09/19  Yes [name], MD  atorvastatin (LIPITOR) 80 MG tablet Take 80 mg by mouth every evening. 07/31/18   [provider]  bisacodyl (DULCOLAX) 5 MG EC tablet Take 1 tablet (5 mg total) by mouth daily as needed for moderate constipation. Patient not taking: Reported on 06/24/2019 01/05/19   [name], MD  chlorthalidone (HYGROTON) 25 MG tablet Take 25 mg by mouth daily.    [provider]  Cholecalciferol (VITAMIN D-3) 25 MCG (1000 UT)  CAPS Take by mouth.    [provider]  collagenase (SANTYL) ointment Apply 1 application topically daily. Patient not taking: Reported on 06/24/2019 04/16/19   Kris Hartmann, NP  divalproex (DEPAKOTE ER) 500 MG 24 hr tablet Take 1 tablet (500 mg total) by mouth daily. Patient not taking: Reported on 06/24/2019 02/16/19   Charlynne Cousins, MD  doxycycline (VIBRAMYCIN) 100 MG capsule Take 100 mg by mouth 2 (two) times daily.    [provider]  Ensure Max Protein (ENSURE MAX PROTEIN) LIQD Take 330 mLs (11 oz total) by mouth 2 (two) times daily. 01/05/19   [name], MD  ferrous sulfate 325 (65 FE) MG tablet Take 1 tablet (325 mg total) by mouth daily with breakfast. Patient not taking: Reported on 04/13/2019 10/18/18   [name] Mandes, MD  HYDROcodone-acetaminophen (NORCO/VICODIN) 5-325 MG tablet Take 1 tablet by mouth every 6 (six) hours  as needed for moderate pain (pain score 4-6). Patient not taking: Reported on 06/24/2019 02/19/19   Charlynne Cousins, MD  metFORMIN (GLUCOPHAGE) 500 MG tablet Take by mouth daily.    [provider]  potassium chloride (KLOR-CON) 20 MEQ packet Take by mouth 2 (two) times daily.    [provider]  senna (SENOKOT) 8.6 MG TABS tablet Take 1 tablet (8.6 mg total) by mouth daily as needed for mild constipation. Patient not taking: Reported on 06/24/2019 01/05/19   [name], MD  silver sulfADIAZINE (SILVADENE) 1 % cream Apply 1 application topically daily. Patient not taking: Reported on 06/24/2019 01/30/19   Kris Hartmann, NP  vitamin C (VITAMIN C) 250 MG tablet Take 1 tablet (250 mg total) by mouth 2 (two) times daily. Patient not taking: Reported on 06/24/2019 01/05/19   [name], MD  zinc sulfate 220 (50 Zn) MG capsule Take 220 mg by mouth daily.    [provider]    Allergies Contrast media [iodinated diagnostic agents], Iohexol, Metrizamide, and Latex  Family History  Problem Relation Age of Onset  .  Hypertension Mother   . Diabetes Mother   . Prostate cancer Neg Hx   . Bladder Cancer Neg Hx   . Kidney disease Neg Hx     Social History Social History   Tobacco Use  . Smoking status: Former Smoker    Packs/day: 0.25    Types: Cigarettes    Quit date: 10/14/1994    Years since quitting: 24.7  . Smokeless tobacco: Never Used  Substance Use Topics  . Alcohol use: No    Alcohol/week: 0.0 standard drinks  . Drug use: No    Review of Systems  Constitutional: No fever/chills Eyes: No visual changes. ENT: No sore throat. Cardiovascular: Positive for chest pain. Respiratory: Denies shortness of breath. Gastrointestinal: No abdominal pain.  No nausea, no vomiting.  No diarrhea.  No constipation. Genitourinary: Negative for dysuria. Musculoskeletal: Negative for back pain. Skin: Negative for rash. Neurological: Negative for headaches, focal weakness or numbness.  ____________________________________________   PHYSICAL EXAM:  VITAL SIGNS: ED Triage Vitals  Enc Vitals Group     BP --      Pulse Rate 06/24/19 0205 79     Resp 06/24/19 0205 20     Temp --      Temp Source 06/24/19 0205 Oral     SpO2 --      Weight 06/24/19 0207 175 lb (79.4 kg)     Height --      Head Circumference --      Peak Flow --      Pain Score 06/24/19 0206 0     Pain Loc --      Pain Edu? --      Excl. in [place]? --     Constitutional: Alert and oriented. Eyes: Conjunctivae are normal. Head: Atraumatic. Nose: No congestion/rhinnorhea. Mouth/Throat: Mucous membranes are moist. Neck: Normal ROM Cardiovascular: Normal rate, regular rhythm. Grossly normal heart sounds. Respiratory: Normal respiratory effort.  No retractions. Lungs CTAB. Gastrointestinal: Soft and nontender. No distention. Genitourinary: deferred Musculoskeletal: No lower extremity tenderness nor edema. Status post bilateral BKA. Neurologic:  Normal speech and language. No gross focal neurologic deficits are  appreciated. Skin:  Skin is warm, dry and intact. No rash noted. Psychiatric: Mood and affect are normal. Speech and behavior are normal.  ____________________________________________   LABS (all labs ordered are listed, but only abnormal results are displayed)  Labs Reviewed  BASIC METABOLIC PANEL - Abnormal; Notable for the following components:      Result Value   Sodium 134 (*)    Glucose, Bld 526 (*)    BUN 30 (*)    Creatinine, Ser 1.40 (*)    Calcium 8.8 (*)    GFR calc non Af Amer 46 (*)    GFR calc Af Amer 54 (*)    All other components within normal limits  CBC WITH DIFFERENTIAL/PLATELET - Abnormal; Notable for the following components:   WBC 10.7 (*)    Neutro Abs 7.9 (*)    Eosinophils Absolute 1.0 (*)    All other components within normal limits  GLUCOSE, CAPILLARY - Abnormal; Notable for the following components:   Glucose-Capillary 331 (*)    All other components within normal limits  TROPONIN I (HIGH SENSITIVITY) - Abnormal; Notable for the following components:   Troponin I (High Sensitivity) 34 (*)    All other components within normal limits  TROPONIN I (HIGH SENSITIVITY) - Abnormal; Notable for the following components:   Troponin I (High Sensitivity) 62 (*)    All other components within normal limits  RESPIRATORY PANEL BY RT PCR (FLU A&B, COVID)  APTT  PROTIME-INR  HEPARIN LEVEL (UNFRACTIONATED)   ____________________________________________  EKG  ED ECG REPORT I, Blake Divine, the attending physician, personally viewed and interpreted this ECG.   Date: 06/24/2019  EKG Time: 2:04  Rate: 80  Rhythm: normal sinus rhythm  Axis: Normal  Intervals:none  ST&T Change: ST depressions inferiorly, ST elevation in aVR, Q waves anteriorly   PROCEDURES  Procedure(s) performed (including Critical Care):  .Critical Care Performed by: Blake Divine, MD Authorized by: Blake Divine, MD   Critical care provider statement:    Critical care time  (minutes):  45   Critical care time was exclusive of:  Separately billable procedures and treating other patients and teaching time   Critical care was necessary to treat or prevent imminent or life-threatening deterioration of the following conditions:  Cardiac failure   Critical care was time spent personally by me on the following activities:  Discussions with consultants, evaluation of patient's response to treatment, examination of patient, ordering and performing treatments and interventions, ordering and review of laboratory studies, ordering and review of radiographic studies, pulse oximetry, re-evaluation of patient's condition, obtaining history from patient or surrogate and review of old charts   I assumed direction of critical care for this patient from another provider in my specialty: no       ____________________________________________   INITIAL IMPRESSION / ASSESSMENT AND PLAN / ED COURSE       85 year old male with history of CAD presents to the ED by EMS following acute onset of chest pressure around 11 PM this evening. EKG obtained by EMS was reviewed prior to arrival and consistent with STEMI given anterior ST elevations with reciprocal changes noted. Code STEMI was called prior to arrival, however patient received 2 additional sublingual nitro by EMS along with placement of Nitropaste and now states that chest pain has resolved upon arrival. Initial EKG no longer meet STEMI criteria but does have concerning ST depressions inferiorly. We'll continue to monitor for recurrence of chest pain, check chest x-ray and labs.  Patient evaluated by Dr. Saralyn Pilar of cardiology and initial plan was for him to go to the Cath Lab.  However, upon further review of his prior EKGs, he has had similar changes in the past with known ostial LAD occlusion.  Initial troponin only  very mildly elevated and Dr. Saralyn Pilar recommends holding off on catheterization or heparin drip for now unless troponins  were to increase or patient to have worsening pain.  Lab work significant for hyperglycemia with no evidence of DKA, patient was given IV fluids and IV insulin with improvement.  Repeat troponin now elevated to 62 and we will start a heparin drip.  He remains pain-free at this time.  Case discussed with hospitalist, who accepts patient for admission.      ____________________________________________   FINAL CLINICAL IMPRESSION(S) / ED DIAGNOSES  Final diagnoses:  Chest pain, unspecified type  Coronary artery disease involving native coronary artery of native heart with angina pectoris [facility])     ED Discharge Orders    None       Note:  This document was prepared using Dragon voice recognition software and may include unintentional dictation errors.   Blake Divine, MD 06/24/19 [phone]

## 2019-06-24 NOTE — ED Notes (Signed)
Per lab, blue top is hemolyzed. Lab notified that will need their assist in a redraw. tonya in lab confirms will send assist. Per pharmacist david please hold starting heparin until blue top is complete.

## 2019-06-24 NOTE — Plan of Care (Addendum)
Discussed with pt and wife the plan for cardiac catheterization on 06/25/19.  Currently denies CP, SOB, N/V.  Oriented to safety features of the room, call light system.  Bed low, brakes locked, side rails up for ease of repositioning and safety.   Problem: Education: Goal: Knowledge of General Education information will improve Description: Including pain rating scale, medication(s)/side effects and non-pharmacologic comfort measures Outcome: Progressing   Problem: Health Behavior/Discharge Planning: Goal: Ability to manage health-related needs will improve Outcome: Progressing   Problem: Clinical Measurements: Goal: Ability to maintain clinical measurements within normal limits will improve Outcome: Progressing Goal: Will remain free from infection Outcome: Progressing Goal: Diagnostic test results will improve Outcome: Progressing Goal: Respiratory complications will improve Outcome: Progressing Goal: Cardiovascular complication will be avoided Outcome: Progressing   Problem: Activity: Goal: Risk for activity intolerance will decrease Outcome: Progressing   Problem: Nutrition: Goal: Adequate nutrition will be maintained Outcome: Progressing   Problem: Coping: Goal: Level of anxiety will decrease Outcome: Progressing   Problem: Elimination: Goal: Will not experience complications related to bowel motility Outcome: Progressing Goal: Will not experience complications related to urinary retention Outcome: Progressing   Problem: Pain Managment: Goal: General experience of comfort will improve Outcome: Progressing   Problem: Safety: Goal: Ability to remain free from injury will improve Outcome: Progressing   Problem: Skin Integrity: Goal: Risk for impaired skin integrity will decrease Outcome: Progressing   Problem: Education: Goal: Understanding of CV disease, CV risk reduction, and recovery process will improve Outcome: Progressing Goal: Individualized  Educational Video(s) Outcome: Progressing   Problem: Activity: Goal: Ability to return to baseline activity level will improve Outcome: Progressing   Problem: Cardiovascular: Goal: Ability to achieve and maintain adequate cardiovascular perfusion will improve Outcome: Progressing Goal: Vascular access site(s) Level 0-1 will be maintained Outcome: Progressing   Problem: Health Behavior/Discharge Planning: Goal: Ability to safely manage health-related needs after discharge will improve Outcome: Progressing

## 2019-06-24 NOTE — ED Notes (Signed)
Critical troponin of 62 called from lab. Dr. Charna Archer notified, order for heparin placed in computer.

## 2019-06-24 NOTE — ED Notes (Signed)
Cardiologist here.

## 2019-06-24 NOTE — Consult Note (Signed)
[facility] Cardiology  CARDIOLOGY CONSULT NOTE  Patient ID: [name] MRN: [id number] DOB/AGE: 12-20-1934 85 y.o.  Admit date: 06/24/2019 Referring Physician Charna Archer Primary Physician Payton Mccallum Primary Cardiologist Nehemiah Massed Reason for Consultation chest pain  HPI: 85 year old gentleman referred for chest pain and possible anterior septal STEMI.  Patient was in his usual state of health until approximately 11 PM 06/23/2019 when he experienced 8 out of 10 chest pain.  EMS was called, initial ECG revealed Q waves in leads V1 and V2 with ST elevation.  Patient was transported to [facility] ED at which time chest pain resolved, with near resolution of ST changes.  Patient has known coronary artery disease, status post non-ST elevation myocardial infarction 06/03/2018.  The patient underwent cardiac catheterization 06/05/2018 which revealed occluded ostial LAD with 70% stenosis proximal RCA.  2D echocardiogram 06/03/2018 revealed LV ejection fraction of 45 to 50%.  The patient has multiple comorbidities including insulin requiring diabetes, central hypertension, and atrial fibrillation on Xarelto for stroke prevention.  The patient has known peripheral vascular disease, bilateral amputee, status post recent right below-knee amputation by Dr. Lucky Cowboy.  Review of systems complete and found to be negative unless listed above     Past Medical History:  Diagnosis Date  . Arthritis   . Atrial fibrillation ([place])   . Carcinoma of prostate ([place])   . CHF (congestive heart failure) ([place])   . Chronic kidney disease   . Diabetes mellitus without complication ([place])   . ED (erectile dysfunction)   . Frequent urination   . Hyperlipidemia   . Hypertension   . Moderate mitral insufficiency   . Peripheral vascular disease ([place])   . Pneumonia 04/2016  . Prostate cancer ([place])   . Sleep apnea    OSA--USE C-PAP  . Ulcer of left foot due to type 2 diabetes mellitus ([place])   . Urinary stress incontinence, male     Past  Surgical History:  Procedure Laterality Date  . AMPUTATION Left 01/12/2017   Procedure: AMPUTATION BELOW KNEE;  Surgeon: Algernon Huxley, MD;  Location: [facility] ORS;  Service: General;  Laterality: Left;  . AMPUTATION Right 12/27/2018   Procedure: AMPUTATION BELOW KNEE;  Surgeon: Algernon Huxley, MD;  Location: [facility] ORS;  Service: General;  Laterality: Right;  . APLIGRAFT PLACEMENT Left 10/15/2016   Procedure: APLIGRAFT PLACEMENT;  Surgeon: Albertine Patricia, DPM;  Location: [facility] ORS;  Service: Podiatry;  Laterality: Left;  necrotic ulcer  . CHOLECYSTECTOMY    . COLONOSCOPY WITH PROPOFOL N/A 01/02/2019   Procedure: COLONOSCOPY WITH PROPOFOL;  Surgeon: Lucilla Lame, MD;  Location: [facility] ENDOSCOPY;  Service: Endoscopy;  Laterality: N/A;  . COLONOSCOPY WITH PROPOFOL N/A 01/05/2019   Procedure: COLONOSCOPY WITH PROPOFOL;  Surgeon: Lucilla Lame, MD;  Location: [facility] ENDOSCOPY;  Service: Endoscopy;  Laterality: N/A;  . ESOPHAGOGASTRODUODENOSCOPY (EGD) WITH PROPOFOL N/A 01/02/2019   Procedure: ESOPHAGOGASTRODUODENOSCOPY (EGD) WITH PROPOFOL;  Surgeon: Lucilla Lame, MD;  Location: [facility] ENDOSCOPY;  Service: Endoscopy;  Laterality: N/A;  . EYE SURGERY Bilateral    Cataract Extraction with IOL  . HIP ARTHROPLASTY Right 10/13/2018   Procedure: ARTHROPLASTY BIPOLAR HIP (HEMIARTHROPLASTY);  Surgeon: Earnestine Leys, MD;  Location: [facility] ORS;  Service: Orthopedics;  Laterality: Right;  . IRRIGATION AND DEBRIDEMENT FOOT Left 10/15/2016   Procedure: IRRIGATION AND DEBRIDEMENT FOOT-EXCISIONAL DEBRIDEMENT OF SKIN 3RD, 4TH AND 5TH TOES WITH APPLICATION OF APLIGRAFT;  Surgeon: Albertine Patricia, DPM;  Location: [facility] ORS;  Service: Podiatry;  Laterality: Left;  necrotic,gangrene  . IRRIGATION AND DEBRIDEMENT FOOT Left  12/09/2016   Procedure: IRRIGATION AND DEBRIDEMENT FOOT-MUSCLE/FASCIA, RESECTION OF FOURTH AND FIFTH METATARSAL NECROTIC BONE;  Surgeon: Albertine Patricia, DPM;  Location: [facility] ORS;  Service: Podiatry;  Laterality: Left;  .  IRRIGATION AND DEBRIDEMENT FOOT Right 12/23/2018   Procedure: IRRIGATION AND DEBRIDEMENT FOOT and wound vac application;  Surgeon: Samara Deist, DPM;  Location: [facility] ORS;  Service: Podiatry;  Laterality: Right;  . LEFT HEART CATH AND CORONARY ANGIOGRAPHY N/A 06/05/2018   Procedure: LEFT HEART CATH AND CORONARY ANGIOGRAPHY;  Surgeon: Yolonda Kida, MD;  Location: [place] CV LAB;  Service: Cardiovascular;  Laterality: N/A;  . LOWER EXTREMITY ANGIOGRAPHY Left 08/16/2016   Procedure: Lower Extremity Angiography;  Surgeon: Algernon Huxley, MD;  Location: [place] CV LAB;  Service: Cardiovascular;  Laterality: Left;  . LOWER EXTREMITY ANGIOGRAPHY Left 09/01/2016   Procedure: Lower Extremity Angiography;  Surgeon: Algernon Huxley, MD;  Location: [place] CV LAB;  Service: Cardiovascular;  Laterality: Left;  . LOWER EXTREMITY ANGIOGRAPHY Left 10/14/2016   Procedure: Lower Extremity Angiography;  Surgeon: Algernon Huxley, MD;  Location: [place] CV LAB;  Service: Cardiovascular;  Laterality: Left;  . LOWER EXTREMITY ANGIOGRAPHY Left 12/20/2016   Procedure: Lower Extremity Angiography;  Surgeon: Algernon Huxley, MD;  Location: [place] CV LAB;  Service: Cardiovascular;  Laterality: Left;  . LOWER EXTREMITY ANGIOGRAPHY Right 12/18/2018   Procedure: LOWER EXTREMITY ANGIOGRAPHY;  Surgeon: Algernon Huxley, MD;  Location: [place] CV LAB;  Service: Cardiovascular;  Laterality: Right;  . LOWER EXTREMITY INTERVENTION  09/01/2016   Procedure: Lower Extremity Intervention;  Surgeon: Algernon Huxley, MD;  Location: [place] CV LAB;  Service: Cardiovascular;;  . PROSTATE SURGERY     removal  . TONSILLECTOMY     as a child    (Not in a hospital admission)  Social History   Socioeconomic History  . Marital status: Married    Spouse name: Not on file  . Number of children: Not on file  . Years of education: Not on file  . Highest education level: Not on file  Occupational History  . Occupation:  retired  Tobacco Use  . Smoking status: Former Smoker    Packs/day: 0.25    Types: Cigarettes    Quit date: 10/14/1994    Years since quitting: 24.7  . Smokeless tobacco: Never Used  Substance and Sexual Activity  . Alcohol use: No    Alcohol/week: 0.0 standard drinks  . Drug use: No  . Sexual activity: Not on file  Other Topics Concern  . Not on file  Social History Narrative   Lives at home with wife, has a wheelchair   Social Determinants of Health   Financial Resource Strain:   . Difficulty of Paying Living Expenses: Not on file  Food Insecurity:   . Worried About Charity fundraiser in the Last Year: Not on file  . Ran Out of Food in the Last Year: Not on file  Transportation Needs:   . Lack of Transportation (Medical): Not on file  . Lack of Transportation (Non-Medical): Not on file  Physical Activity:   . Days of Exercise per Week: Not on file  . Minutes of Exercise per Session: Not on file  Stress:   . Feeling of Stress : Not on file  Social Connections:   . Frequency of Communication with Friends and Family: Not on file  . Frequency of Social Gatherings with Friends and Family: Not on file  . Attends Religious  Services: Not on file  . Active Member of Clubs or Organizations: Not on file  . Attends Archivist Meetings: Not on file  . Marital Status: Not on file  Intimate Partner Violence:   . Fear of Current or Ex-Partner: Not on file  . Emotionally Abused: Not on file  . Physically Abused: Not on file  . Sexually Abused: Not on file    Family History  Problem Relation Age of Onset  . Hypertension Mother   . Diabetes Mother   . Prostate cancer Neg Hx   . Bladder Cancer Neg Hx   . Kidney disease Neg Hx       Review of systems complete and found to be negative unless listed above      PHYSICAL EXAM  General: Well developed, well nourished, in no acute distress HEENT:  Normocephalic and atramatic Neck:  No JVD.  Lungs: Clear bilaterally  to auscultation and percussion. Heart: HRRR . Normal S1 and S2 without gallops or murmurs.  Abdomen: Bowel sounds are positive, abdomen soft and non-tender  Msk:  Back normal, normal gait. Normal strength and tone for age. Extremities: No clubbing, cyanosis or edema.   Neuro: Alert and oriented X 3. Psych:  Good affect, responds appropriately  Labs:   Lab Results  Component Value Date   WBC 10.7 (H) 06/24/2019   HGB 14.2 06/24/2019   HCT 42.6 06/24/2019   MCV 85.4 06/24/2019   PLT 265 06/24/2019    Recent Labs  Lab 06/24/19 0205  NA 134*  K 4.1  CL 99  CO2 26  BUN 30*  CREATININE 1.40*  CALCIUM 8.8*  GLUCOSE 526*   Lab Results  Component Value Date   CKTOTAL 131 10/22/2016   CKMB 1.2 01/19/2013   TROPONINI 0.26 (HH) 06/07/2018    Lab Results  Component Value Date   CHOL 146 06/06/2018   CHOL 122 10/25/2017   CHOL 108 10/16/2017   Lab Results  Component Value Date   HDL 30 (L) 06/06/2018   HDL 39 (L) 10/25/2017   HDL 37 (L) 10/16/2017   Lab Results  Component Value Date   LDLCALC 95 06/06/2018   LDLCALC 70 10/25/2017   LDLCALC 62 10/16/2017   Lab Results  Component Value Date   TRIG 104 06/06/2018   TRIG 65 10/25/2017   TRIG 47 10/16/2017   Lab Results  Component Value Date   CHOLHDL 4.9 06/06/2018   CHOLHDL 3.1 10/25/2017   CHOLHDL 2.9 10/16/2017   No results found for: LDLDIRECT    Radiology: No results found.  EKG: Sinus rhythm with Q waves and residual ST elevation in leads V1 and V2 which appear unchanged compared to prior ECG  ASSESSMENT AND PLAN:   1.  Chest pain, now resolved after sublingual nitroglycerin, borderline elevated troponin, in patient with known coronary artery disease, history of non-ST elevation myocardial infarction, and known occluded ostial LAD with ECG which reveals Q waves and residual ST elevation in leads V1 and V2 which appear to be unchanged compared to prior ECGs.  This is unlikely an acute anteroseptal MI based  upon patient's known coronary anatomy. 2.  Coronary artery disease, chronically occluded ostial LAD, with collaterals 3.  Paroxysmal atrial fibrillation, on Xarelto for stroke prevention, and diltiazem and metoprolol succinate for rate control 4.  Essential hypertension, blood pressure currently elevated 5.  Peripheral vascular disease, status post bilateral amputation 6.  Chronic kidney disease 7.  Contrast allergy  Recommendations  1.  Cancel code STEMI 2.  Resume Cardizem CD and metoprolol succinate for blood pressure control 3.  Resume isosorbide mononitrate 60 mg daily 4.  If second troponin elevated consistent with non-ST elevation myocardial infarction, then heparin bolus with drip 5.  Resume Xarelto pending serial high-sensitivity troponin levels 6.  2D echocardiogram 7.  Further recommendations pending patient's initial clinical course  Signed: Isaias Cowman MD,PhD, [facility] 06/24/2019, 2:59 AM

## 2019-06-24 NOTE — Progress Notes (Signed)
CRITICAL VALUE ALERT  Critical Value:  Troponin 116 Date & Time Notied:  06/24/19 at 1230 Provider Notified: Drs. Sreenath and Paraschos Orders Received/Actions taken: no new orders

## 2019-06-24 NOTE — ED Notes (Signed)
Heparin verified with ann cales, rn.

## 2019-06-24 NOTE — Plan of Care (Signed)
  Problem: Education: Goal: Knowledge of General Education information will improve Description: Including pain rating scale, medication(s)/side effects and non-pharmacologic comfort measures Outcome: Progressing   Problem: Clinical Measurements: Goal: Ability to maintain clinical measurements within normal limits will improve Outcome: Progressing Goal: Respiratory complications will improve Outcome: Progressing   Problem: Activity: Goal: Risk for activity intolerance will decrease Outcome: Progressing   

## 2019-06-24 NOTE — ED Notes (Signed)
Per chart, flagged pt has a legal gaurdian. Pt denies such. No legal guardian contact information listed in chart.

## 2019-06-24 NOTE — Progress Notes (Signed)
ANTICOAGULATION CONSULT NOTE -  Pharmacy Consult for heparin Indication: chest pain/ACS  Allergies  Allergen Reactions  . Contrast Media [Iodinated Diagnostic Agents] Shortness Of Breath    SOB  . Iohexol Shortness Of Breath     Desc: Respiratory Distress, Laryngedema, diaphoresis, Onset Date: [id number]   . Metrizamide Shortness Of Breath    SOB SOB SOB   . Latex Rash    Patient Measurements: Weight: 175 lb (79.4 kg) Heparin Dosing Weight: 79.4 kg  Vital Signs: Temp: 98.4 F (36.9 C) (01/31 0209) Temp Source: Oral (01/31 0209) BP: 179/79 (01/31 0700) Pulse Rate: 64 (01/31 0711)  Labs: Recent Labs    06/24/19 0205 06/24/19 0359 06/24/19 0559  HGB 14.2  --   --   HCT 42.6  --   --   PLT 265  --   --   APTT 30  --   --   LABPROT 14.5  --   --   INR 1.1  --   --   HEPARINUNFRC  --   --  <0.10*  CREATININE 1.40*  --   --   TROPONINIHS 34* 62*  --     Estimated Creatinine Clearance: 41.1 mL/min (A) (by C-G formula based on SCr of 1.4 mg/dL (H)).   Medical History: Past Medical History:  Diagnosis Date  . Arthritis   . Atrial fibrillation ([place])   . Carcinoma of prostate ([place])   . CHF (congestive heart failure) ([place])   . Chronic kidney disease   . Diabetes mellitus without complication ([place])   . ED (erectile dysfunction)   . Frequent urination   . Hyperlipidemia   . Hypertension   . Moderate mitral insufficiency   . Peripheral vascular disease ([place])   . Pneumonia 04/2016  . Prostate cancer ([place])   . Sleep apnea    OSA--USE C-PAP  . Ulcer of left foot due to type 2 diabetes mellitus ([place])   . Urinary stress incontinence, male     Medications:  Scheduled:  . amiodarone  200 mg Oral Daily  . aspirin  324 mg Oral NOW   Or  . aspirin  300 mg Rectal NOW  . [START ON 06/25/2019] aspirin EC  325 mg Oral Daily  . atorvastatin  80 mg Oral QPM  . diltiazem  120 mg Oral Daily  . feeding supplement (ENSURE ENLIVE)  237 mL Oral BID  . furosemide  40 mg  Intravenous Q12H  . gabapentin  300 mg Oral TID  . insulin aspart  0-20 Units Subcutaneous Q2H  . insulin glargine  15 Units Subcutaneous QHS  . isosorbide mononitrate  60 mg Oral Daily  . metoprolol tartrate  25 mg Oral TID  . multivitamin with minerals  1 tablet Oral Daily  . zinc sulfate  220 mg Oral Daily    Assessment: Patient arrives to ED as a CODE STEMI, but code was cancelled per cardio. Patient has been here prior in November for CP, and has a h/o afib on xarelto 15 mg daily PTA. EKG showing borderline ST depression and slight elevation in II and V1 respectively. Trops 34 >> 62. Patient's baseline labs WNL, baseline HL pending. Patient is being started on heparin drip for management of NSTEMI.   Will omit bolus for now as HL is still pending. Will start rate at 1000 units/hr Will dose by aPTT if baseline HL elevated above 0.1, if < 0.10 dose per anti-Xa. Will monitor daily CBC's and adjust per above recommendations.  Goal  of Therapy:  Heparin level 0.3-0.7 units/ml Monitor platelets by anticoagulation protocol: Yes   Plan:  1/31 0559 HL (baseline-prior to starting drip)= <0.10.therefore will follow Heparin levels (ant-Xa) and order bolus of 4000 units. F/u HL in 8 hours   Chinita Greenland PharmD Clinical Pharmacist 06/24/2019

## 2019-06-24 NOTE — ED Notes (Signed)
Pt to cath lab.

## 2019-06-24 NOTE — ED Triage Notes (Signed)
Pt code stemi with ems, per ems pt received 2 sl ntg, 324mg  asa, 1 inch ntg paste. Pt states pain began at 2300. Pt complains of generalized chest pain. md at bedside.

## 2019-06-24 NOTE — Progress Notes (Signed)
ANTICOAGULATION CONSULT NOTE - Initial Consult  Pharmacy Consult for heparin Indication: chest pain/ACS  Allergies  Allergen Reactions  . Contrast Media [Iodinated Diagnostic Agents] Shortness Of Breath    SOB  . Iohexol Shortness Of Breath     Desc: Respiratory Distress, Laryngedema, diaphoresis, Onset Date: [id number]   . Metrizamide Shortness Of Breath    SOB SOB SOB   . Latex Rash    Patient Measurements: Weight: 175 lb (79.4 kg) Heparin Dosing Weight: 79.4 kg  Vital Signs: Temp: 98.4 F (36.9 C) (01/31 0209) Temp Source: Oral (01/31 0209) BP: 173/77 (01/31 0530) Pulse Rate: 57 (01/31 0530)  Labs: Recent Labs    06/24/19 0205 06/24/19 0359  HGB 14.2  --   HCT 42.6  --   PLT 265  --   APTT 30  --   LABPROT 14.5  --   INR 1.1  --   CREATININE 1.40*  --   TROPONINIHS 34* 62*    Estimated Creatinine Clearance: 41.1 mL/min (A) (by C-G formula based on SCr of 1.4 mg/dL (H)).   Medical History: Past Medical History:  Diagnosis Date  . Arthritis   . Atrial fibrillation ([place])   . Carcinoma of prostate ([place])   . CHF (congestive heart failure) ([place])   . Chronic kidney disease   . Diabetes mellitus without complication ([place])   . ED (erectile dysfunction)   . Frequent urination   . Hyperlipidemia   . Hypertension   . Moderate mitral insufficiency   . Peripheral vascular disease ([place])   . Pneumonia 04/2016  . Prostate cancer ([place])   . Sleep apnea    OSA--USE C-PAP  . Ulcer of left foot due to type 2 diabetes mellitus ([place])   . Urinary stress incontinence, male     Medications:  Scheduled:  . amiodarone  200 mg Oral Daily  . aspirin  324 mg Oral NOW   Or  . aspirin  300 mg Rectal NOW  . [START ON 06/25/2019] aspirin EC  325 mg Oral Daily  . atorvastatin  80 mg Oral QPM  . diltiazem  120 mg Oral Daily  . feeding supplement (ENSURE ENLIVE)  237 mL Oral BID  . furosemide  40 mg Intravenous Q12H  . gabapentin  300 mg Oral TID  . insulin aspart  0-20  Units Subcutaneous Q2H  . insulin aspart  3 Units Intravenous Once  . insulin glargine  15 Units Subcutaneous QHS  . isosorbide mononitrate  60 mg Oral Daily  . metoprolol tartrate  25 mg Oral TID  . multivitamin with minerals  1 tablet Oral Daily  . zinc sulfate  220 mg Oral Daily    Assessment: Patient arrives to ED as a CODE STEMI, but code was cancelled per cardio. Patient has been here prior in November for CP, and has a h/o afib on xarelto 15 mg daily PTA. EKG showing borderline ST depression and slight elevation in II and V1 respectively. Trops 34 >> 62. Patient's baseline labs WNL, baseline HL pending. Patient is being started on heparin drip for management of NSTEMI.   Goal of Therapy:  Heparin level 0.3-0.7 units/ml Monitor platelets by anticoagulation protocol: Yes   Plan:  Will omit bolus for now as HL is still pending. Will start rate at 1000 units/hr Will dose by aPTT if baseline HL elevated above 0.1, if < 0.10 dose per anti-Xa. Will monitor daily CBC's and adjust per above recommendations.  Tobie Lords, PharmD, BCPS Clinical Pharmacist 06/24/2019,7:04  AM

## 2019-06-24 NOTE — Progress Notes (Signed)
ANTICOAGULATION CONSULT NOTE -  Pharmacy Consult for heparin Indication: chest pain/ACS  Allergies  Allergen Reactions  . Contrast Media [Iodinated Diagnostic Agents] Shortness Of Breath    SOB  . Iohexol Shortness Of Breath     Desc: Respiratory Distress, Laryngedema, diaphoresis, Onset Date: [id number]   . Metrizamide Shortness Of Breath    SOB SOB SOB   . Latex Rash    Patient Measurements: Height: 5\' 7"  (170.2 cm) Weight: 175 lb (79.4 kg) IBW/kg (Calculated) : 66.1 Heparin Dosing Weight: 79.4 kg  Vital Signs: Temp: 98.1 F (36.7 C) (01/31 1510) Temp Source: Oral (01/31 1039) BP: 155/69 (01/31 1510) Pulse Rate: 57 (01/31 1510)  Labs: Recent Labs    06/24/19 0205 06/24/19 0359 06/24/19 0559 06/24/19 1047 06/24/19 1552  HGB 14.2  --   --   --   --   HCT 42.6  --   --   --   --   PLT 265  --   --   --   --   APTT 30  --   --   --   --   LABPROT 14.5  --   --   --   --   INR 1.1  --   --   --   --   HEPARINUNFRC  --   --  <0.10*  --  0.17*  CREATININE 1.40*  --   --   --   --   TROPONINIHS 34* 62*  --  116*  --     Estimated Creatinine Clearance: 41.1 mL/min (A) (by C-G formula based on SCr of 1.4 mg/dL (H)).   Medical History: Past Medical History:  Diagnosis Date  . Arthritis   . Atrial fibrillation ([place])   . Carcinoma of prostate ([place])   . CHF (congestive heart failure) ([place])   . Chronic kidney disease   . Diabetes mellitus without complication ([place])   . ED (erectile dysfunction)   . Frequent urination   . Hyperlipidemia   . Hypertension   . Moderate mitral insufficiency   . Peripheral vascular disease ([place])   . Pneumonia 04/2016  . Prostate cancer ([place])   . Sleep apnea    OSA--USE C-PAP  . Ulcer of left foot due to type 2 diabetes mellitus ([place])   . Urinary stress incontinence, male     Medications:  Scheduled:  . amiodarone  200 mg Oral Daily  . aspirin  324 mg Oral NOW   Or  . aspirin  300 mg Rectal NOW  . [START ON 06/25/2019]  aspirin EC  325 mg Oral Daily  . atorvastatin  80 mg Oral QPM  . diltiazem  120 mg Oral Daily  . feeding supplement (ENSURE ENLIVE)  237 mL Oral BID  . furosemide  40 mg Intravenous Q12H  . gabapentin  300 mg Oral TID  . heparin  2,400 Units Intravenous Once  . insulin aspart  0-20 Units Subcutaneous Q2H  . insulin glargine  15 Units Subcutaneous QHS  . isosorbide mononitrate  60 mg Oral Daily  . metoprolol tartrate  25 mg Oral TID  . multivitamin with minerals  1 tablet Oral Daily  . sodium chloride flush  3 mL Intravenous Q12H  . zinc sulfate  220 mg Oral Daily    Assessment: Patient arrives to ED as a CODE STEMI, but code was cancelled per cardio. Patient has been here prior in November for CP, and has a h/o afib on xarelto 15 mg daily  PTA. EKG showing borderline ST depression and slight elevation in II and V1 respectively. Trops 34 >> 62. Patient's baseline labs WNL, baseline HL pending. Patient is being started on heparin drip for management of NSTEMI.   Will monitor daily CBC's and adjust per above recommendations.  1/31 0559 HL (baseline-prior to starting drip)= <0.10.therefore will follow Heparin levels (ant-Xa) and order bolus of 4000 units. 1/31 1552 HL 0.17. subtherapeutic   Goal of Therapy:  Heparin level 0.3-0.7 units/ml Monitor platelets by anticoagulation protocol: Yes   Plan:  Level is currently subtherapeutic. Will rebolus with 2,400 units x 1 and increase rate to 1,250 units/hr.  F/u HL in 8 hours   Pearla Dubonnet, PharmD Clinical Pharmacist 06/24/2019 4:56 PM

## 2019-06-24 NOTE — ED Notes (Signed)
Per dr. Saralyn Pilar will not perform heart cath at this time. Pt taken back to ed room 10.

## 2019-06-24 NOTE — ED Notes (Signed)
Pt external catheter not working properly- changed linens and cleaned up pt- new external catheter placed

## 2019-06-25 ENCOUNTER — Inpatient Hospital Stay: Admit: 2019-06-25 | Payer: Medicare Other

## 2019-06-25 ENCOUNTER — Encounter
Admission: EM | Disposition: A | Discharge status: Home / Self Care | Payer: Self-pay | Source: Home / Self Care | Attending: Internal Medicine

## 2019-06-25 ENCOUNTER — Encounter: Payer: Self-pay | Admitting: Cardiology

## 2019-06-25 ENCOUNTER — Inpatient Hospital Stay
Admit: 2019-06-25 | Discharge: 2019-06-25 | Disposition: A | Discharge status: Home / Self Care | Payer: Medicare Other | Attending: Cardiology | Admitting: Cardiology

## 2019-06-25 DIAGNOSIS — I214 Non-ST elevation (NSTEMI) myocardial infarction: Principal | ICD-10-CM

## 2019-06-25 DIAGNOSIS — Z89512 Acquired absence of left leg below knee: Secondary | ICD-10-CM

## 2019-06-25 DIAGNOSIS — I160 Hypertensive urgency: Secondary | ICD-10-CM

## 2019-06-25 DIAGNOSIS — E119 Type 2 diabetes mellitus without complications: Secondary | ICD-10-CM

## 2019-06-25 DIAGNOSIS — I25118 Atherosclerotic heart disease of native coronary artery with other forms of angina pectoris: Secondary | ICD-10-CM

## 2019-06-25 DIAGNOSIS — Z89511 Acquired absence of right leg below knee: Secondary | ICD-10-CM

## 2019-06-25 DIAGNOSIS — I10 Essential (primary) hypertension: Secondary | ICD-10-CM

## 2019-06-25 DIAGNOSIS — I739 Peripheral vascular disease, unspecified: Secondary | ICD-10-CM

## 2019-06-25 HISTORY — PX: LEFT HEART CATH: CATH118248

## 2019-06-25 LAB — CBC
HCT: 38.4 % — ABNORMAL LOW (ref 39.0–52.0)
Hemoglobin: 13 g/dL (ref 13.0–17.0)
MCH: 28.9 pg (ref 26.0–34.0)
MCHC: 33.9 g/dL (ref 30.0–36.0)
MCV: 85.3 fL (ref 80.0–100.0)
Platelets: 268 10*3/uL (ref 150–400)
RBC: 4.5 MIL/uL (ref 4.22–5.81)
RDW: 13 % (ref 11.5–15.5)
WBC: 13.2 10*3/uL — ABNORMAL HIGH (ref 4.0–10.5)
nRBC: 0 % (ref 0.0–0.2)

## 2019-06-25 LAB — TROPONIN I (HIGH SENSITIVITY): Troponin I (High Sensitivity): 73 ng/L — ABNORMAL HIGH (ref <18)

## 2019-06-25 LAB — LIPID PANEL
Cholesterol: 121 mg/dL (ref 0–200)
HDL: 34 mg/dL — ABNORMAL LOW (ref >40)
LDL Cholesterol: 76 mg/dL (ref 0–99)
Total CHOL/HDL Ratio: 3.6 RATIO
Triglycerides: 57 mg/dL (ref <150)
VLDL: 11 mg/dL (ref 0–40)

## 2019-06-25 LAB — GLUCOSE, CAPILLARY
Glucose-Capillary: 114 mg/dL — ABNORMAL HIGH (ref 70–99)
Glucose-Capillary: 155 mg/dL — ABNORMAL HIGH (ref 70–99)
Glucose-Capillary: 193 mg/dL — ABNORMAL HIGH (ref 70–99)
Glucose-Capillary: 338 mg/dL — ABNORMAL HIGH (ref 70–99)

## 2019-06-25 LAB — ECHOCARDIOGRAM COMPLETE
Height: 67 in
Weight: 2740.76 oz

## 2019-06-25 LAB — BASIC METABOLIC PANEL
Anion gap: 8 (ref 5–15)
BUN: 23 mg/dL (ref 8–23)
CO2: 30 mmol/L (ref 22–32)
Calcium: 8.7 mg/dL — ABNORMAL LOW (ref 8.9–10.3)
Chloride: 101 mmol/L (ref 98–111)
Creatinine, Ser: 1.24 mg/dL (ref 0.61–1.24)
GFR calc Af Amer: >60 mL/min (ref >60)
GFR calc non Af Amer: 54 mL/min — ABNORMAL LOW (ref >60)
Glucose, Bld: 174 mg/dL — ABNORMAL HIGH (ref 70–99)
Potassium: 3.8 mmol/L (ref 3.5–5.1)
Sodium: 139 mmol/L (ref 135–145)

## 2019-06-25 LAB — HEPARIN LEVEL (UNFRACTIONATED): Heparin Unfractionated: 0.53 IU/mL (ref 0.30–0.70)

## 2019-06-25 SURGERY — LEFT HEART CATH
Anesthesia: Moderate Sedation

## 2019-06-25 MED ORDER — ONDANSETRON HCL 4 MG/2ML IJ SOLN: 4.0000 mg | Freq: Four times a day (QID) | INTRAMUSCULAR | Status: DC | PRN

## 2019-06-25 MED ORDER — VERAPAMIL HCL 2.5 MG/ML IV SOLN
INTRAVENOUS | Status: AC
  Filled 2019-06-25: qty 2

## 2019-06-25 MED ORDER — VERAPAMIL HCL 2.5 MG/ML IV SOLN
INTRAVENOUS | Status: DC | PRN
  Administered 2019-06-25: 2.5 mg via INTRA_ARTERIAL

## 2019-06-25 MED ORDER — INSULIN ASPART 100 UNIT/ML ~~LOC~~ SOLN
0.0000 [IU] | Freq: Three times a day (TID) | SUBCUTANEOUS | Status: DC
  Administered 2019-06-25: 15 [IU] via SUBCUTANEOUS
  Administered 2019-06-26 (×2): 7 [IU] via SUBCUTANEOUS
  Filled 2019-06-25 (×3): qty 1

## 2019-06-25 MED ORDER — RIVAROXABAN 15 MG PO TABS
15.0000 mg | ORAL_TABLET | Freq: Every day | ORAL | Status: DC
  Filled 2019-06-25: qty 1

## 2019-06-25 MED ORDER — LABETALOL HCL 5 MG/ML IV SOLN: 10.0000 mg | INTRAVENOUS | Status: AC | PRN

## 2019-06-25 MED ORDER — ASPIRIN EC 325 MG PO TBEC
325.0000 mg | DELAYED_RELEASE_TABLET | Freq: Every day | ORAL | Status: DC
  Administered 2019-06-26: 325 mg via ORAL
  Filled 2019-06-25: qty 1

## 2019-06-25 MED ORDER — HEPARIN SODIUM (PORCINE) 1000 UNIT/ML IJ SOLN
INTRAMUSCULAR | Status: AC
  Filled 2019-06-25: qty 1

## 2019-06-25 MED ORDER — HALOPERIDOL LACTATE 5 MG/ML IJ SOLN: 1.0000 mg | Freq: Four times a day (QID) | INTRAMUSCULAR | Status: DC | PRN

## 2019-06-25 MED ORDER — FENTANYL CITRATE (PF) 100 MCG/2ML IJ SOLN
INTRAMUSCULAR | Status: AC
  Filled 2019-06-25: qty 2

## 2019-06-25 MED ORDER — FAMOTIDINE 20 MG PO TABS
ORAL_TABLET | ORAL | Status: AC
  Filled 2019-06-25: qty 2

## 2019-06-25 MED ORDER — ACETAMINOPHEN 325 MG PO TABS
ORAL_TABLET | ORAL | Status: AC
  Filled 2019-06-25: qty 2

## 2019-06-25 MED ORDER — SODIUM CHLORIDE 0.9% FLUSH: 3.0000 mL | INTRAVENOUS | Status: DC | PRN

## 2019-06-25 MED ORDER — METHYLPREDNISOLONE SODIUM SUCC 125 MG IJ SOLR
INTRAMUSCULAR | Status: AC
  Administered 2019-06-25: 125 mg via INTRAVENOUS
  Filled 2019-06-25: qty 2

## 2019-06-25 MED ORDER — MIDAZOLAM HCL 2 MG/2ML IJ SOLN
INTRAMUSCULAR | Status: AC
  Filled 2019-06-25: qty 2

## 2019-06-25 MED ORDER — HYDRALAZINE HCL 20 MG/ML IJ SOLN: 10.0000 mg | INTRAMUSCULAR | Status: AC | PRN

## 2019-06-25 MED ORDER — SODIUM CHLORIDE 0.9 % WEIGHT BASED INFUSION: 1.0000 mL/kg/h | INTRAVENOUS | Status: AC

## 2019-06-25 MED ORDER — METHYLPREDNISOLONE SODIUM SUCC 125 MG IJ SOLR: 125.0000 mg | Freq: Once | INTRAMUSCULAR | Status: AC

## 2019-06-25 MED ORDER — SODIUM CHLORIDE 0.9% FLUSH
3.0000 mL | Freq: Two times a day (BID) | INTRAVENOUS | Status: DC
  Administered 2019-06-25 – 2019-06-26 (×2): 3 mL via INTRAVENOUS

## 2019-06-25 MED ORDER — ACETAMINOPHEN 325 MG PO TABS: 650.0000 mg | ORAL_TABLET | ORAL | Status: DC | PRN

## 2019-06-25 MED ORDER — DIPHENHYDRAMINE HCL 50 MG/ML IJ SOLN
INTRAMUSCULAR | Status: AC
  Administered 2019-06-25: 50 mg via INTRAVENOUS
  Filled 2019-06-25: qty 1

## 2019-06-25 MED ORDER — SODIUM CHLORIDE 0.9 % IV SOLN: 250.0000 mL | INTRAVENOUS | Status: DC | PRN

## 2019-06-25 MED ORDER — HEPARIN (PORCINE) IN NACL 1000-0.9 UT/500ML-% IV SOLN
INTRAVENOUS | Status: DC | PRN
  Administered 2019-06-25: 500 mL

## 2019-06-25 MED ORDER — IOHEXOL 300 MG/ML  SOLN
INTRAMUSCULAR | Status: DC | PRN
  Administered 2019-06-25: 80 mL

## 2019-06-25 MED ORDER — DIPHENHYDRAMINE HCL 50 MG/ML IJ SOLN: 50.0000 mg | Freq: Once | INTRAMUSCULAR | Status: AC

## 2019-06-25 MED ORDER — HEPARIN SODIUM (PORCINE) 1000 UNIT/ML IJ SOLN
INTRAMUSCULAR | Status: DC | PRN
  Administered 2019-06-25: 4000 [IU] via INTRAVENOUS

## 2019-06-25 SURGICAL SUPPLY — 8 items
CATH 5F 110X4 TIG (CATHETERS) ×2 IMPLANT
DEVICE INFLAT 30 PLUS (MISCELLANEOUS) IMPLANT
DEVICE RAD TR BAND REGULAR (VASCULAR PRODUCTS) ×2 IMPLANT
GLIDESHEATH SLEND SS 6F .021 (SHEATH) ×2 IMPLANT
KIT MANI 3VAL PERCEP (MISCELLANEOUS) ×2 IMPLANT
PACK CARDIAC CATH (CUSTOM PROCEDURE TRAY) ×2 IMPLANT
WIRE HITORQ VERSACORE ST 145CM (WIRE) ×2 IMPLANT
WIRE ROSEN-J .035X260CM (WIRE) ×2 IMPLANT

## 2019-06-25 NOTE — Progress Notes (Signed)
Dr. Saralyn Pilar in at bedside to speak with pt. Re: cath results.

## 2019-06-25 NOTE — Discharge Summary (Addendum)
Physician Discharge Summary  [name] [id number] DOB: [birth year] DOA: 06/24/2019  PCP: Corey Skains, MD  Admit date: 06/24/2019 Discharge date: 06/25/2019  Admitted From: Home Disposition: Home  Recommendations for Outpatient Follow-up:  1. Follow up with PCP in 1-2 weeks 2. Cardiology as directed  Home Health: No Equipment/Devices: None Discharge Condition: Stable CODE STATUS: Full Diet recommendation: Heart Healthy / Carb Modified  Brief/Interim Summary: 85 year old Caucasian male with history of known coronary artery disease with known occlusion of left anterior descending artery from previous catheterization, atrial fibrillation, hypertension, type 2 diabetes, hyperlipidemia, peripheral vascular disease status post bilateral lower extremity amputations who presented to the emergency department with acute onset of midsternal chest pain.  Initial EKG concerning for deep Q waves in lead V1 and V2 with notable ST elevation.  Code STEMI was initiated at this time.  Cardiology responded to bedside.  Code STEMI canceled after review by  cardiologist.  1/31: Patient remains on heparin infusion for presumed non-ST elevation myocardial infarction.  Echocardiogram is pending.  Cardiology to follow-up on results.  Exacerbation of heart failure is certainly a possibility.  Patient mildly hypoxic requiring 2 L nasal cannula.  Patient remains hypertensive.  We will continue diuresis at this time.  Case discussed with Dr. Saralyn Pilar.  We will continue therapeutic anticoagulation for now.  Plan for inpatient cardiac catheterization on 06/25/2019.  2/1: Patient underwent successful left heart cardiac catheterization.  Results consistent with prior.  Discussed with with cardiology.  No indication for further inpatient cardiac work-up at this time.  Patient stable for discharge.  No change in medications made.  Follow-up with Dr. Nehemiah Massed in 1 to 2 weeks.  2/2: Discharge held yesterday due  to acutely elevated blood pressures.  Home medication regimen restarted.  Patient monitored in-house overnight.  Stable for discharge in the morning.  Blood pressures controlled and chest pain-free  Discharge Diagnoses:  Active Problems:   ACS (acute coronary syndrome) (HCC)  NSTEMI Initial concern for STEMI given Q waves and ST elevations noted on EKG Case reviewed by cardiology, patient has occluded LAD with collateralization thus cannot have an anterolateral infarct Code STEMI canceled Troponins remain elevated Diagnostic catheterization on 06/25/2019 Results consistent with prior cath No change in medications Resume goal-directed therapies Follow-up cardiology in 2 weeks  Nonketotic hyperglycemia with uncontrolled type 2 diabetes mellitus.   Blood sugars improved after inpatient insulin dosing Follow-up outpatient Can resume home regimen  Acute on chronic diastolic CHF.   This is clearly secondary to #1.   The patient will be on gentle diuresis with IV Lasix. Volume status euvolemic at time of discharge Resume home regimen  Paroxysmal atrial fibrillation Continue amiodarone Resume home DOAC  Discharge Instructions  Discharge Instructions    Diet - low sodium heart healthy   Complete by: As directed    Increase activity slowly   Complete by: As directed      Allergies as of 06/25/2019      Reactions   Contrast Media [iodinated Diagnostic Agents] Shortness Of Breath   SOB   Iohexol Shortness Of Breath    Desc: Respiratory Distress, Laryngedema, diaphoresis, Onset Date: [id number]   Metrizamide Shortness Of Breath   SOB SOB SOB   Latex Rash      Medication List    TAKE these medications   acetaminophen 325 MG tablet Commonly known as: TYLENOL Take 650 mg by mouth every 6 (six) hours as needed.   amiodarone 200 MG tablet Commonly known as: PACERONE Take  200 mg by mouth daily.   ascorbic acid 250 MG tablet Commonly known as: VITAMIN C Take 1 tablet (250  mg total) by mouth 2 (two) times daily.   aspirin EC 81 MG tablet Take 1 tablet (81 mg total) by mouth daily.   atorvastatin 80 MG tablet Commonly known as: LIPITOR Take 80 mg by mouth every evening.   bisacodyl 5 MG EC tablet Commonly known as: DULCOLAX Take 1 tablet (5 mg total) by mouth daily as needed for moderate constipation.   chlorthalidone 25 MG tablet Commonly known as: HYGROTON Take 25 mg by mouth daily.   collagenase ointment Commonly known as: SANTYL Apply 1 application topically daily.   diltiazem 120 MG 24 hr capsule Commonly known as: CARDIZEM CD Take 120 mg by mouth daily.   divalproex 500 MG 24 hr tablet Commonly known as: DEPAKOTE ER Take 1 tablet (500 mg total) by mouth daily.   doxycycline 100 MG capsule Commonly known as: VIBRAMYCIN Take 100 mg by mouth 2 (two) times daily.   Ensure Max Protein Liqd Take 330 mLs (11 oz total) by mouth 2 (two) times daily.   ferrous sulfate 325 (65 FE) MG tablet Take 1 tablet (325 mg total) by mouth daily with breakfast.   gabapentin 300 MG capsule Commonly known as: NEURONTIN Take 300 mg by mouth 3 (three) times daily.   HYDROcodone-acetaminophen 5-325 MG tablet Commonly known as: NORCO/VICODIN Take 1 tablet by mouth every 6 (six) hours as needed for moderate pain (pain score 4-6).   insulin glargine 100 UNIT/ML injection Commonly known as: LANTUS Inject 0.15 mLs (15 Units total) into the skin at bedtime.   insulin lispro 100 UNIT/ML injection Commonly known as: HumaLOG Inject 0.03 mLs (3 Units total) into the skin 3 (three) times daily with meals. What changed: additional instructions   isosorbide mononitrate 60 MG 24 hr tablet Commonly known as: IMDUR Take 1 tablet (60 mg total) by mouth daily.   metFORMIN 500 MG tablet Commonly known as: GLUCOPHAGE Take by mouth daily.   metoprolol succinate 25 MG 24 hr tablet Commonly known as: TOPROL-XL Take 25 mg by mouth daily.   metoprolol tartrate 25  MG tablet Commonly known as: LOPRESSOR Take 25 mg by mouth 3 (three) times daily.   multivitamin with minerals Tabs tablet Take 1 tablet by mouth daily.   nitroGLYCERIN 0.4 MG SL tablet Commonly known as: NITROSTAT Place 1 tablet (0.4 mg total) under the tongue every 5 (five) minutes as needed for chest pain.   potassium chloride 20 MEQ packet Commonly known as: KLOR-CON Take by mouth 2 (two) times daily.   Rivaroxaban 15 MG Tabs tablet Commonly known as: XARELTO Take 1 tablet (15 mg total) by mouth daily with supper. Hold Xarelto for 3 days.  Start on January 09, 2019.   senna 8.6 MG Tabs tablet Commonly known as: SENOKOT Take 1 tablet (8.6 mg total) by mouth daily as needed for mild constipation.   silver sulfADIAZINE 1 % cream Commonly known as: SILVADENE Apply 1 application topically daily.   Vitamin D-3 25 MCG (1000 UT) Caps Take by mouth.   zinc sulfate 220 (50 Zn) MG capsule Take 220 mg by mouth daily.       Allergies  Allergen Reactions  . Contrast Media [Iodinated Diagnostic Agents] Shortness Of Breath    SOB  . Iohexol Shortness Of Breath     Desc: Respiratory Distress, Laryngedema, diaphoresis, Onset Date: [id number]   . Metrizamide Shortness Of Breath  SOB SOB SOB   . Latex Rash    Consultations:  Cardiology- [facility]   Procedures/Studies: CARDIAC CATHETERIZATION  Result Date: 06/25/2019  Ost LAD lesion is 100% stenosed.  Ost Cx lesion is 30% stenosed.  Prox Cx lesion is 30% stenosed.  Mid Cx lesion is 30% stenosed.  Ost RCA to Prox RCA lesion is 50% stenosed.  1.  One-vessel CAD with chronically occluded ostial LAD, unchanged compared to prior cardiac catheterization 06/05/2018 2.  Mild to moderate reduced left ventricular function with estimated LV ejection fraction 35 to 40% Recommendations 1.  Continue medical therapy 2.  Aggressive risk factor modification 3.  Resume Xarelto for stroke prevention 4.  DC heparin 5.  Discharge home  later today 6.  Follow-up with Dr. Nehemiah Massed in 1 to 2 weeks   DG Chest Portable 1 View  Result Date: 06/24/2019 CLINICAL DATA:  Chest pain, code STEMI EXAM: PORTABLE CHEST 1 VIEW COMPARISON:  Radiograph 02/08/2019 FINDINGS: Diffuse hazy interstitial opacities with redistribution of the pulmonary vascularity, fissural and septal thickening and cardiomegaly. Chronic areas of scarring are noted in the right lung apex. The aorta is calcified. No visible pneumothorax or effusion. No acute osseous or soft tissue abnormality. Defibrillator pads and telemetry leads overlie the chest. IMPRESSION: Features compatible with heart failure and/or volume overload with pulmonary edema and cardiomegaly. No visible effusions. Chronic areas of scarring particularly towards the lung apices. Electronically Signed   By: Lovena Le M.D.   On: 06/24/2019 03:05    (Echo, Carotid, EGD, Colonoscopy, ERCP)    Subjective: Seen and examined on the day of discharge Status post cardiac catheterization Stable, chest pain-free Medically stable for discharge home  Discharge Exam: Vitals:   06/25/19 1110 06/25/19 1155  BP:  (!) 128/55  Pulse: (!) 46 (!) 47  Resp:  16  Temp:  (!) 97.5 F (36.4 C)  SpO2:  95%   Vitals:   06/25/19 1000 06/25/19 1045 06/25/19 1110 06/25/19 1155  BP: (!) 153/71   (!) 128/55  Pulse:  (!) 50 (!) 46 (!) 47  Resp: 18 16  16   Temp:    (!) 97.5 F (36.4 C)  TempSrc:    Oral  SpO2:    95%  Weight:      Height:        General: Pt is alert, awake, not in acute distress Cardiovascular: Regular rate and rhythm, systolic murmur Respiratory: CTA bilaterally, no wheezing, no rhonchi Abdominal: Soft, NT, ND, bowel sounds + Extremities: Bilateral lower extremity amputation    The results of significant diagnostics from this hospitalization (including imaging, microbiology, ancillary and laboratory) are listed below for reference.     Microbiology: Recent Results (from the past 240  hour(s))  Respiratory Panel by RT PCR (Flu A&B, Covid) - Nasopharyngeal Swab     Status: None   Collection Time: 06/24/19  2:06 AM   Specimen: Nasopharyngeal Swab  Result Value Ref Range Status   SARS Coronavirus 2 by RT PCR NEGATIVE NEGATIVE Final    Comment: (NOTE) SARS-CoV-2 target nucleic acids are NOT DETECTED. The SARS-CoV-2 RNA is generally detectable in upper respiratoy specimens during the acute phase of infection. The lowest concentration of SARS-CoV-2 viral copies this assay can detect is 131 copies/mL. A negative result does not preclude SARS-Cov-2 infection and should not be used as the sole basis for treatment or other patient management decisions. A negative result may occur with  improper specimen collection/handling, submission of specimen other than nasopharyngeal swab,  presence of viral mutation(s) within the areas targeted by this assay, and inadequate number of viral copies (<131 copies/mL). A negative result must be combined with clinical observations, patient history, and epidemiological information. The expected result is Negative. Fact Sheet for Patients:  PinkCheek.be Fact Sheet for Healthcare Providers:  GravelBags.it This test is not yet ap proved or cleared by the [place] FDA and  has been authorized for detection and/or diagnosis of SARS-CoV-2 by FDA under an Emergency Use Authorization (EUA). This EUA will remain  in effect (meaning this test can be used) for the duration of the COVID-19 declaration under Section 564(b)(1) of the Act, 21 U.S.C. section 360bbb-3(b)(1), unless the authorization is terminated or revoked sooner.    Influenza A by PCR NEGATIVE NEGATIVE Final   Influenza B by PCR NEGATIVE NEGATIVE Final    Comment: (NOTE) The Xpert Xpress SARS-CoV-2/FLU/RSV assay is intended as an aid in  the diagnosis of influenza from Nasopharyngeal swab specimens and  should not be used as  a sole basis for treatment. Nasal washings and  aspirates are unacceptable for Xpert Xpress SARS-CoV-2/FLU/RSV  testing. Fact Sheet for Patients: PinkCheek.be Fact Sheet for Healthcare Providers: GravelBags.it This test is not yet approved or cleared by the [place] FDA and  has been authorized for detection and/or diagnosis of SARS-CoV-2 by  FDA under an Emergency Use Authorization (EUA). This EUA will remain  in effect (meaning this test can be used) for the duration of the  Covid-19 declaration under Section 564(b)(1) of the Act, 21  U.S.C. section 360bbb-3(b)(1), unless the authorization is  terminated or revoked. Performed at [facility], [address] [place], [place] [address]      Labs: BNP (last 3 results) Recent Labs    12/21/18 0054 02/08/19 0556 06/24/19 1346  BNP 1,436.0* 839.0* 0000000*   Basic Metabolic Panel: Recent Labs  Lab 06/24/19 0205 06/25/19 0119  NA 134* 139  K 4.1 3.8  CL 99 101  CO2 26 30  GLUCOSE 526* 174*  BUN 30* 23  CREATININE 1.40* 1.24  CALCIUM 8.8* 8.7*   Liver Function Tests: No results for input(s): AST, ALT, ALKPHOS, BILITOT, PROT, ALBUMIN in the last 168 hours. No results for input(s): LIPASE, AMYLASE in the last 168 hours. No results for input(s): AMMONIA in the last 168 hours. CBC: Recent Labs  Lab 06/24/19 0205 06/25/19 0119  WBC 10.7* 13.2*  NEUTROABS 7.9*  --   HGB 14.2 13.0  HCT 42.6 38.4*  MCV 85.4 85.3  PLT 265 268   Cardiac Enzymes: No results for input(s): CKTOTAL, CKMB, CKMBINDEX, TROPONINI in the last 168 hours. BNP: Invalid input(s): POCBNP CBG: Recent Labs  Lab 06/24/19 1511 06/24/19 1657 06/24/19 2033 06/25/19 0812 06/25/19 1157  GLUCAP 206* 108* 193* 114* 155*   D-Dimer No results for input(s): DDIMER in the last 72 hours. Hgb A1c Recent Labs    06/24/19 1047  HGBA1C 9.4*   Lipid Profile Recent Labs     06/25/19 0119  CHOL 121  HDL 34*  LDLCALC 76  TRIG 57  CHOLHDL 3.6   Thyroid function studies No results for input(s): TSH, T4TOTAL, T3FREE, THYROIDAB in the last 72 hours.  Invalid input(s): FREET3 Anemia work up No results for input(s): VITAMINB12, FOLATE, FERRITIN, TIBC, IRON, RETICCTPCT in the last 72 hours. Urinalysis    Component Value Date/Time   COLORURINE YELLOW (A) 10/13/2018 1205   APPEARANCEUR CLEAR (A) 10/13/2018 1205   LABSPEC 1.016 10/13/2018 1205   PHURINE  6.0 10/13/2018 1205   GLUCOSEU >=500 (A) 10/13/2018 1205   HGBUR NEGATIVE 10/13/2018 1205   [place] 10/13/2018 1205   KETONESUR NEGATIVE 10/13/2018 1205   PROTEINUR NEGATIVE 10/13/2018 1205   NITRITE NEGATIVE 10/13/2018 1205   LEUKOCYTESUR NEGATIVE 10/13/2018 1205   Sepsis Labs Invalid input(s): PROCALCITONIN,  WBC,  LACTICIDVEN Microbiology Recent Results (from the past 240 hour(s))  Respiratory Panel by RT PCR (Flu A&B, Covid) - Nasopharyngeal Swab     Status: None   Collection Time: 06/24/19  2:06 AM   Specimen: Nasopharyngeal Swab  Result Value Ref Range Status   SARS Coronavirus 2 by RT PCR NEGATIVE NEGATIVE Final    Comment: (NOTE) SARS-CoV-2 target nucleic acids are NOT DETECTED. The SARS-CoV-2 RNA is generally detectable in upper respiratoy specimens during the acute phase of infection. The lowest concentration of SARS-CoV-2 viral copies this assay can detect is 131 copies/mL. A negative result does not preclude SARS-Cov-2 infection and should not be used as the sole basis for treatment or other patient management decisions. A negative result may occur with  improper specimen collection/handling, submission of specimen other than nasopharyngeal swab, presence of viral mutation(s) within the areas targeted by this assay, and inadequate number of viral copies (<131 copies/mL). A negative result must be combined with clinical observations, patient history, and epidemiological  information. The expected result is Negative. Fact Sheet for Patients:  PinkCheek.be Fact Sheet for Healthcare Providers:  GravelBags.it This test is not yet ap proved or cleared by the [place] FDA and  has been authorized for detection and/or diagnosis of SARS-CoV-2 by FDA under an Emergency Use Authorization (EUA). This EUA will remain  in effect (meaning this test can be used) for the duration of the COVID-19 declaration under Section 564(b)(1) of the Act, 21 U.S.C. section 360bbb-3(b)(1), unless the authorization is terminated or revoked sooner.    Influenza A by PCR NEGATIVE NEGATIVE Final   Influenza B by PCR NEGATIVE NEGATIVE Final    Comment: (NOTE) The Xpert Xpress SARS-CoV-2/FLU/RSV assay is intended as an aid in  the diagnosis of influenza from Nasopharyngeal swab specimens and  should not be used as a sole basis for treatment. Nasal washings and  aspirates are unacceptable for Xpert Xpress SARS-CoV-2/FLU/RSV  testing. Fact Sheet for Patients: PinkCheek.be Fact Sheet for Healthcare Providers: GravelBags.it This test is not yet approved or cleared by the [place] FDA and  has been authorized for detection and/or diagnosis of SARS-CoV-2 by  FDA under an Emergency Use Authorization (EUA). This EUA will remain  in effect (meaning this test can be used) for the duration of the  Covid-19 declaration under Section 564(b)(1) of the Act, 21  U.S.C. section 360bbb-3(b)(1), unless the authorization is  terminated or revoked. Performed at [facility], [address] [place], [place] [address]      Time coordinating discharge: Over 30 minutes  SIGNED:   Sidney Ace, MD  [facility] 06/25/2019, 3:27 PM Pager   If 7PM-7AM, please contact night-coverage

## 2019-06-25 NOTE — Progress Notes (Signed)
Returned from cath lab. Lethargic. R radial  benign with dressing clean, Dry, Intact. Unable to resume PO meds at this time due to lethargy.

## 2019-06-25 NOTE — Progress Notes (Signed)
ANTICOAGULATION CONSULT NOTE -  Pharmacy Consult for heparin Indication: chest pain/ACS  Allergies  Allergen Reactions  . Contrast Media [Iodinated Diagnostic Agents] Shortness Of Breath    SOB  . Iohexol Shortness Of Breath     Desc: Respiratory Distress, Laryngedema, diaphoresis, Onset Date: [id number]   . Metrizamide Shortness Of Breath    SOB SOB SOB   . Latex Rash    Patient Measurements: Height: 5\' 7"  (170.2 cm) Weight: 171 lb 6.4 oz (77.7 kg) IBW/kg (Calculated) : 66.1 Heparin Dosing Weight: 79.4 kg  Vital Signs: Temp: 98.4 F (36.9 C) (01/31 2004) Temp Source: Oral (01/31 2004) BP: 139/57 (01/31 2004) Pulse Rate: 53 (01/31 2004)  Labs: Recent Labs    06/24/19 0205 06/24/19 0359 06/24/19 0559 06/24/19 1047 06/24/19 1552 06/25/19 0119  HGB 14.2  --   --   --   --  13.0  HCT 42.6  --   --   --   --  38.4*  PLT 265  --   --   --   --  268  APTT 30  --   --   --   --   --   LABPROT 14.5  --   --   --   --   --   INR 1.1  --   --   --   --   --   HEPARINUNFRC  --   --  <0.10*  --  0.17* 0.53  CREATININE 1.40*  --   --   --   --  1.24  TROPONINIHS 34* 62*  --  116*  --   --     Estimated Creatinine Clearance: 42.9 mL/min (by C-G formula based on SCr of 1.24 mg/dL).   Medical History: Past Medical History:  Diagnosis Date  . Arthritis   . Atrial fibrillation ([place])   . Carcinoma of prostate ([place])   . CHF (congestive heart failure) ([place])   . Chronic kidney disease   . Diabetes mellitus without complication ([place])   . ED (erectile dysfunction)   . Frequent urination   . Hyperlipidemia   . Hypertension   . Moderate mitral insufficiency   . Peripheral vascular disease ([place])   . Pneumonia 04/2016  . Prostate cancer ([place])   . Sleep apnea    OSA--USE C-PAP  . Ulcer of left foot due to type 2 diabetes mellitus ([place])   . Urinary stress incontinence, male     Medications:  Scheduled:  . amiodarone  200 mg Oral Daily  . aspirin  324 mg Oral NOW   Or   . aspirin  300 mg Rectal NOW  . aspirin  81 mg Oral Pre-Cath  . aspirin EC  325 mg Oral Daily  . atorvastatin  80 mg Oral QPM  . diltiazem  120 mg Oral Daily  . feeding supplement (ENSURE ENLIVE)  237 mL Oral BID  . furosemide  40 mg Intravenous Q12H  . gabapentin  300 mg Oral TID  . insulin aspart  0-20 Units Subcutaneous TID WC  . insulin glargine  15 Units Subcutaneous QHS  . isosorbide mononitrate  60 mg Oral Daily  . metoprolol tartrate  25 mg Oral TID  . multivitamin with minerals  1 tablet Oral Daily  . sodium chloride flush  3 mL Intravenous Q12H  . zinc sulfate  220 mg Oral Daily    Assessment: Patient arrives to ED as a CODE STEMI, but code was cancelled per cardio. Patient has  been here prior in November for CP, and has a h/o afib on xarelto 15 mg daily PTA. EKG showing borderline ST depression and slight elevation in II and V1 respectively. Trops 34 >> 62. Patient's baseline labs WNL, baseline HL pending. Patient is being started on heparin drip for management of NSTEMI.   Will monitor daily CBC's and adjust per above recommendations.  1/31 0559 HL (baseline-prior to starting drip)= <0.10.therefore will follow Heparin levels (ant-Xa) and order bolus of 4000 units. 1/31 1552 HL 0.17. subtherapeutic   Goal of Therapy:  Heparin level 0.3-0.7 units/ml Monitor platelets by anticoagulation protocol: Yes   Plan:  02/01 @ 0200 HL 0.53 therapeutic. Will continue current rate and will recheck HL at 0900, CBC trended down slightly will continue to monitor.  Tobie Lords, PharmD Clinical Pharmacist 06/25/2019 2:28 AM

## 2019-06-25 NOTE — Progress Notes (Signed)
Notified dr Priscella Mann of complaints of chest pain. Very anxious and agitated. Skin warm. Dry. pink

## 2019-06-25 NOTE — Progress Notes (Signed)
Notified son of canceled discharge

## 2019-06-25 NOTE — Progress Notes (Signed)
Spoke with son about delayed d/c due to chest pain. Informed MD to evaluate.

## 2019-06-25 NOTE — Progress Notes (Signed)
PROGRESS NOTE    [name]  [id number] DOB: [birth year] DOA: 06/24/2019 PCP: Corey Skains, MD    Brief Narrative:   Briefly this is a 85 year old Caucasian male with history of known coronary artery disease with known occlusion of left anterior descending artery from previous catheterization, atrial fibrillation, hypertension, type 2 diabetes, hyperlipidemia, peripheral vascular disease status post bilateral lower extremity amputations who presented to the emergency department with acute onset of midsternal chest pain.  Initial EKG concerning for deep Q waves in lead V1 and V2 with notable ST elevation.  Code STEMI was initiated at this time.  Cardiology responded to bedside.  Code STEMI canceled after review by cardiologist.  1/31: Patient remains on heparin infusion for presumed non-ST elevation myocardial infarction.  Echocardiogram is pending.  Cardiology to follow-up on results.  Exacerbation of heart failure is certainly a possibility.  Patient mildly hypoxic requiring 2 L nasal cannula.  Patient remains hypertensive.  We will continue diuresis at this time.  Case discussed with Dr. Saralyn Pilar.  We will continue therapeutic anticoagulation for now.  Plan for inpatient cardiac catheterization on 06/25/2019.  2/1: Cath complete.  Discussed with cardiology.  Coronary anatomy essentially stable from previous cath 2 years prior.  No PCI indicated.  No changes in medications.  Cleared from cardiology standpoint.  Patient was evaluated post-cath and was deemed stable for discharge at that time.  However several hours later I received a call from the bedside nurse stating the patient was very agitated and complaining of chest pain.  I evaluated the patient at bedside.  I requested stat vital signs.  Blood pressure markedly elevated at greater than 200.  Sublingual nitro administered.  Cancel discharge.  Patient to remain in hospital overnight and will reevaluate for discharge in the  morning.   Assessment & Plan:   Active Problems:   ACS (acute coronary syndrome) (HCC)  STEMI Initial concern for STEMI given Q waves and ST elevations noted on EKG Case reviewed by cardiology, patient has occluded LAD with collateralization thus cannot have an anterolateral infarct Code STEMI canceled Troponins remain elevated Diagnostic catheterization on 06/25/2019 Results consistent with prior cath No change in medications Resume goal-directed therapies Follow-up cardiology in 2 weeks  Hypertensive urgency After discharge orders were placed at approximately 1730 received call from bedside nurse stating patient was extremely agitated and complaining of chest pain. On my arrival the patient does not endorse agitation stating that he had some issue with the food Instructed bedside nurse to administer sublingual nitroglycerin 0.4 mg Repeat bedside vitals demonstrate a systolic greater than [id number] Upon chart review appears the patient did not get his morning medications due to the cath Directed bedside nurse to administer p.o. amnio p.o. Cardizem and metoprolol as ordered We will cancel discharge and monitor in house overnight If blood pressure improves we will plan on discharge in the morning  Nonketotic hyperglycemia with uncontrolled type 2 diabetes mellitus. Blood sugars improved after inpatient insulin dosing Follow-up outpatient Can resume home regimen  Acute on chronic diastolic CHF.  This is clearly secondary to #1.  The patient will be on gentle diuresis with IV Lasix. Volume status euvolemic at time of discharge Resume home regimen  Paroxysmal atrial fibrillation Continue amiodarone Resume home DOAC   DVT prophylaxis: Xarelto Code Status: Full Family Communication: Wife Millie via phone  disposition Plan: Anticipate home tomorrow if blood pressure improved  Consultants:   Cardiology-[facility]  Procedures:   Left heart catheterization,  06/25/2019  Antimicrobials:  None   Subjective: Seen and examined Visibly agitated Hypertensive  Objective: Vitals:   06/25/19 1155 06/25/19 1537 06/25/19 1734 06/25/19 1738  BP: (!) 128/55 (!) 172/86 (!) 188/97 (!) 180/94  Pulse: (!) 47 63 (!) 106 (!) 103  Resp: 16 16    Temp: (!) 97.5 F (36.4 C) 97.6 F (36.4 C)    TempSrc: Oral Oral    SpO2: 95% 94% 95% 93%  Weight:      Height:        Intake/Output Summary (Last 24 hours) at 06/25/2019 1742 Last data filed at 06/25/2019 1000 Gross per 24 hour  Intake 2250.21 ml  Output 325 ml  Net 1925.21 ml   Filed Weights   06/24/19 0207 06/24/19 1100 06/25/19 0740  Weight: 79.4 kg 77.7 kg 77.7 kg    Examination:  General exam: Appears calm and comfortable  Respiratory system: Clear to auscultation. Respiratory effort normal. Cardiovascular system: S1 & S2 heard, RRR. No JVD, murmurs, rubs, gallops or clicks. No pedal edema. Gastrointestinal system: Abdomen is nondistended, soft and nontender. No organomegaly or masses felt. Normal bowel sounds heard. Central nervous system: Alert and oriented. No focal neurological deficits. Extremities: Symmetric 5 x 5 power. Skin: No rashes, lesions or ulcers Psychiatry: Judgement and insight appear normal. Mood & affect appropriate.     Data Reviewed: I have personally reviewed following labs and imaging studies  CBC: Recent Labs  Lab 06/24/19 0205 06/25/19 0119  WBC 10.7* 13.2*  NEUTROABS 7.9*  --   HGB 14.2 13.0  HCT 42.6 38.4*  MCV 85.4 85.3  PLT 265 [id number]   Basic Metabolic Panel: Recent Labs  Lab 06/24/19 0205 06/25/19 0119  NA 134* 139  K 4.1 3.8  CL 99 101  CO2 26 30  GLUCOSE 526* 174*  BUN 30* 23  CREATININE 1.40* 1.24  CALCIUM 8.8* 8.7*   GFR: Estimated Creatinine Clearance: 42.9 mL/min (by C-G formula based on SCr of 1.24 mg/dL). Liver Function Tests: No results for input(s): AST, ALT, ALKPHOS, BILITOT, PROT, ALBUMIN in the last 168 hours. No results for  input(s): LIPASE, AMYLASE in the last 168 hours. No results for input(s): AMMONIA in the last 168 hours. Coagulation Profile: Recent Labs  Lab 06/24/19 0205  INR 1.1   Cardiac Enzymes: No results for input(s): CKTOTAL, CKMB, CKMBINDEX, TROPONINI in the last 168 hours. BNP (last 3 results) No results for input(s): PROBNP in the last 8760 hours. HbA1C: Recent Labs    06/24/19 1047  HGBA1C 9.4*   CBG: Recent Labs  Lab 06/24/19 1657 06/24/19 2033 06/25/19 0812 06/25/19 1157 06/25/19 1617  GLUCAP 108* 193* 114* 155* 193*   Lipid Profile: Recent Labs    06/25/19 0119  CHOL 121  HDL 34*  LDLCALC 76  TRIG 57  CHOLHDL 3.6   Thyroid Function Tests: No results for input(s): TSH, T4TOTAL, FREET4, T3FREE, THYROIDAB in the last 72 hours. Anemia Panel: No results for input(s): VITAMINB12, FOLATE, FERRITIN, TIBC, IRON, RETICCTPCT in the last 72 hours. Sepsis Labs: No results for input(s): PROCALCITON, LATICACIDVEN in the last 168 hours.  Recent Results (from the past 240 hour(s))  Respiratory Panel by RT PCR (Flu A&B, Covid) - Nasopharyngeal Swab     Status: None   Collection Time: 06/24/19  2:06 AM   Specimen: Nasopharyngeal Swab  Result Value Ref Range Status   SARS Coronavirus 2 by RT PCR NEGATIVE NEGATIVE Final    Comment: (NOTE) SARS-CoV-2 target nucleic acids are NOT DETECTED. The SARS-CoV-2 RNA  is generally detectable in upper respiratoy specimens during the acute phase of infection. The lowest concentration of SARS-CoV-2 viral copies this assay can detect is 131 copies/mL. A negative result does not preclude SARS-Cov-2 infection and should not be used as the sole basis for treatment or other patient management decisions. A negative result may occur with  improper specimen collection/handling, submission of specimen other than nasopharyngeal swab, presence of viral mutation(s) within the areas targeted by this assay, and inadequate number of viral copies (<131  copies/mL). A negative result must be combined with clinical observations, patient history, and epidemiological information. The expected result is Negative. Fact Sheet for Patients:  PinkCheek.be Fact Sheet for Healthcare Providers:  GravelBags.it This test is not yet ap proved or cleared by the [place] FDA and  has been authorized for detection and/or diagnosis of SARS-CoV-2 by FDA under an Emergency Use Authorization (EUA). This EUA will remain  in effect (meaning this test can be used) for the duration of the COVID-19 declaration under Section 564(b)(1) of the Act, 21 U.S.C. section 360bbb-3(b)(1), unless the authorization is terminated or revoked sooner.    Influenza A by PCR NEGATIVE NEGATIVE Final   Influenza B by PCR NEGATIVE NEGATIVE Final    Comment: (NOTE) The Xpert Xpress SARS-CoV-2/FLU/RSV assay is intended as an aid in  the diagnosis of influenza from Nasopharyngeal swab specimens and  should not be used as a sole basis for treatment. Nasal washings and  aspirates are unacceptable for Xpert Xpress SARS-CoV-2/FLU/RSV  testing. Fact Sheet for Patients: PinkCheek.be Fact Sheet for Healthcare Providers: GravelBags.it This test is not yet approved or cleared by the [place] FDA and  has been authorized for detection and/or diagnosis of SARS-CoV-2 by  FDA under an Emergency Use Authorization (EUA). This EUA will remain  in effect (meaning this test can be used) for the duration of the  Covid-19 declaration under Section 564(b)(1) of the Act, 21  U.S.C. section 360bbb-3(b)(1), unless the authorization is  terminated or revoked. Performed at [facility], [address], [place], [place] [address]          Radiology Studies: CARDIAC CATHETERIZATION  Result Date: 06/25/2019  Ost LAD lesion is 100% stenosed.  Ost Cx lesion is 30%  stenosed.  Prox Cx lesion is 30% stenosed.  Mid Cx lesion is 30% stenosed.  Ost RCA to Prox RCA lesion is 50% stenosed.  1.  One-vessel CAD with chronically occluded ostial LAD, unchanged compared to prior cardiac catheterization 06/05/2018 2.  Mild to moderate reduced left ventricular function with estimated LV ejection fraction 35 to 40% Recommendations 1.  Continue medical therapy 2.  Aggressive risk factor modification 3.  Resume Xarelto for stroke prevention 4.  DC heparin 5.  Discharge home later today 6.  Follow-up with Dr. Nehemiah Massed in 1 to 2 weeks   DG Chest Portable 1 View  Result Date: 06/24/2019 CLINICAL DATA:  Chest pain, code STEMI EXAM: PORTABLE CHEST 1 VIEW COMPARISON:  Radiograph 02/08/2019 FINDINGS: Diffuse hazy interstitial opacities with redistribution of the pulmonary vascularity, fissural and septal thickening and cardiomegaly. Chronic areas of scarring are noted in the right lung apex. The aorta is calcified. No visible pneumothorax or effusion. No acute osseous or soft tissue abnormality. Defibrillator pads and telemetry leads overlie the chest. IMPRESSION: Features compatible with heart failure and/or volume overload with pulmonary edema and cardiomegaly. No visible effusions. Chronic areas of scarring particularly towards the lung apices. Electronically Signed   By: Elwin Sleight.D.  On: 06/24/2019 03:05   ECHOCARDIOGRAM COMPLETE  Result Date: 06/25/2019   ECHOCARDIOGRAM REPORT   Patient Name:   [name] Date of Exam: 06/25/2019 Medical Rec #:  [id number]             Height:       67.0 in Accession #:    [id number]            Weight:       171.3 lb Date of Birth:  06/17/1934            BSA:          1.89 m Patient Age:    71 years              BP:           124/64 mmHg Patient Gender: M                     HR:           82 bpm. Exam Location:  [facility] Procedure: 2D Echo, Color Doppler and Cardiac Doppler Indications:     Chest pain 786.50  History:         Patient has  prior history of Echocardiogram examinations, most                  recent 06/03/2018. Risk Factors:Hypertension, Dyslipidemia and                  Diabetes. CKD, PVD, Pt was covid positive during echo.  Sonographer:     Sherrie Sport RDCS (AE) Referring Phys:  [place] Diagnosing Phys: Yolonda Kida MD IMPRESSIONS  1. Left ventricular ejection fraction, by visual estimation, is 60 to 65%. The left ventricle has normal function. There is borderline left ventricular hypertrophy.  2. Left ventricular diastolic parameters are consistent with Grade I diastolic dysfunction (impaired relaxation).  3. The left ventricle has no regional wall motion abnormalities.  4. Global right ventricle has normal systolic function.The right ventricular size is normal. No increase in right ventricular wall thickness.  5. Left atrial size was normal.  6. Right atrial size was normal.  7. 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  8. The tricuspid valve is normal in structure.  9. The tricuspid valve is normal in structure. Tricuspid valve regurgitation is trivial. 10. The aortic valve is abnormal. Aortic valve regurgitation is not visualized. 11. The pulmonic valve was grossly normal. Pulmonic valve regurgitation is not visualized. 12. Normal pulmonary artery systolic pressure. FINDINGS  Left Ventricle: Left ventricular ejection fraction, by visual estimation, is 60 to 65%. The left ventricle has normal function. The left ventricle has no regional wall motion abnormalities. There is borderline left ventricular hypertrophy. Left ventricular diastolic parameters are consistent with Grade I diastolic dysfunction (impaired relaxation). Right Ventricle: The right ventricular size is normal. No increase in right ventricular wall thickness. Global RV systolic function is has normal systolic function. The tricuspid regurgitant velocity is 1.49 m/s, and with an assumed right atrial pressure  of 10 mmHg,  the estimated right ventricular systolic pressure is normal at 18.9 mmHg. Left Atrium: Left atrial size was normal in size. Right Atrium: Right atrial size was normal in size Pericardium: There is no evidence of pericardial effusion. 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 Tricuspid Valve: The tricuspid valve is normal in structure. Tricuspid valve regurgitation is trivial. Aortic Valve: The aortic valve is abnormal.  Aortic valve regurgitation is not visualized. Aortic valve mean gradient measures 3.5 mmHg. Aortic valve peak gradient measures 6.2 mmHg. Aortic valve area, by VTI measures 2.81 cm. Pulmonic Valve: The pulmonic valve was grossly normal. Pulmonic valve regurgitation is not visualized. Pulmonic regurgitation is not visualized. Aorta: The aortic root is normal in size and structure. IAS/Shunts: No atrial level shunt detected by color flow Doppler.  LEFT VENTRICLE PLAX 2D LVIDd:         5.94 cm  Diastology LVIDs:         3.77 cm  LV e' lateral:   4.35 cm/s LV PW:         1.68 cm  LV E/e' lateral: 12.6 LV IVS:        1.23 cm  LV e' medial:    4.90 cm/s LVOT diam:     2.20 cm  LV E/e' medial:  11.2 LV SV:         115 ml LV SV Index:   59.59 LVOT Area:     3.80 cm  RIGHT VENTRICLE RV Basal diam:  2.30 cm RV S prime:     10.70 cm/s TAPSE (M-mode): 3.3 cm LEFT ATRIUM             Index       RIGHT ATRIUM           Index LA diam:        4.70 cm 2.48 cm/m  RA Area:     12.50 cm LA Vol (A2C):   51.7 ml 27.31 ml/m RA Volume:   22.90 ml  12.09 ml/m LA Vol (A4C):   61.3 ml 32.38 ml/m LA Biplane Vol: 57.1 ml 30.16 ml/m  AORTIC VALVE                   PULMONIC VALVE AV Area (Vmax):    2.22 cm    PV Vmax:        0.89 m/s AV Area (Vmean):   2.38 cm    PV Peak grad:   3.2 mmHg AV Area (VTI):     2.81 cm    RVOT Peak grad: 1 mmHg AV Vmax:           124.00 cm/s AV Vmean:          85.900 cm/s AV VTI:            0.279 m AV Peak Grad:      6.2 mmHg AV Mean Grad:      3.5 mmHg  LVOT Vmax:         72.40 cm/s LVOT Vmean:        53.800 cm/s LVOT VTI:          0.206 m LVOT/AV VTI ratio: 0.74  AORTA Ao Root diam: 3.00 cm MITRAL VALVE                        TRICUSPID VALVE MV Area (PHT): 2.18 cm             TR Peak grad:   8.9 mmHg MV PHT:        100.92 msec          TR Vmax:        149.00 cm/s MV Decel Time: 348 msec MV E velocity: 54.80 cm/s 103 cm/s  SHUNTS MV A velocity: 98.00 cm/s 70.3 cm/s Systemic VTI:  0.21 m MV E/A ratio:  0.56  1.5       Systemic Diam: 2.20 cm  Yolonda Kida MD Electronically signed by Yolonda Kida MD Signature Date/Time: 06/25/2019/4:45:19 PM    Final         Scheduled Meds: . amiodarone  200 mg Oral Daily  . [START ON 06/26/2019] aspirin EC  325 mg Oral Daily  . atorvastatin  80 mg Oral QPM  . diltiazem  120 mg Oral Daily  . feeding supplement (ENSURE ENLIVE)  237 mL Oral BID  . furosemide  40 mg Intravenous Q12H  . gabapentin  300 mg Oral TID  . insulin aspart  0-20 Units Subcutaneous TID WC  . insulin glargine  15 Units Subcutaneous QHS  . isosorbide mononitrate  60 mg Oral Daily  . metoprolol tartrate  25 mg Oral TID  . multivitamin with minerals  1 tablet Oral Daily  . [START ON 06/26/2019] rivaroxaban  15 mg Oral Daily  . sodium chloride flush  3 mL Intravenous Q12H  . sodium chloride flush  3 mL Intravenous Q12H  . zinc sulfate  220 mg Oral Daily   Continuous Infusions: . sodium chloride    . sodium chloride Stopped (06/25/19 1741)     LOS: 1 day    Time spent: 35 minutes    Sidney Ace, MD [facility] Pager [phone]  If 7PM-7AM, please contact night-coverage [email or web address] Password [facility] 06/25/2019, 5:42 PM

## 2019-06-25 NOTE — Progress Notes (Signed)
*  PRELIMINARY RESULTS* Echocardiogram 2D Echocardiogram has been performed.  Sherrie Sport 06/25/2019, 1:42 PM

## 2019-06-26 ENCOUNTER — Ambulatory Visit (INDEPENDENT_AMBULATORY_CARE_PROVIDER_SITE_OTHER): Payer: Medicare Other | Admitting: Vascular Surgery

## 2019-06-26 LAB — GLUCOSE, CAPILLARY
Glucose-Capillary: 248 mg/dL — ABNORMAL HIGH (ref 70–99)
Glucose-Capillary: 202 mg/dL — ABNORMAL HIGH (ref 70–99)

## 2019-06-26 NOTE — Progress Notes (Signed)
[name] to be D/C'd Home per MD order.  Discussed prescriptions and follow up appointments with the patient. Prescriptions given to patient, medication list explained in detail. Pt verbalized understanding.Son here to pick up patient.  Allergies as of 06/26/2019      Reactions   Contrast Media [iodinated Diagnostic Agents] Shortness Of Breath   SOB   Iohexol Shortness Of Breath    Desc: Respiratory Distress, Laryngedema, diaphoresis, Onset Date: [id number]   Metrizamide Shortness Of Breath   SOB SOB SOB   Latex Rash      Medication List    TAKE these medications   acetaminophen 325 MG tablet Commonly known as: TYLENOL Take 650 mg by mouth every 6 (six) hours as needed.   amiodarone 200 MG tablet Commonly known as: PACERONE Take 200 mg by mouth daily.   ascorbic acid 250 MG tablet Commonly known as: VITAMIN C Take 1 tablet (250 mg total) by mouth 2 (two) times daily.   aspirin EC 81 MG tablet Take 1 tablet (81 mg total) by mouth daily.   atorvastatin 80 MG tablet Commonly known as: LIPITOR Take 80 mg by mouth every evening.   bisacodyl 5 MG EC tablet Commonly known as: DULCOLAX Take 1 tablet (5 mg total) by mouth daily as needed for moderate constipation.   chlorthalidone 25 MG tablet Commonly known as: HYGROTON Take 25 mg by mouth daily.   collagenase ointment Commonly known as: SANTYL Apply 1 application topically daily.   diltiazem 120 MG 24 hr capsule Commonly known as: CARDIZEM CD Take 120 mg by mouth daily.   divalproex 500 MG 24 hr tablet Commonly known as: DEPAKOTE ER Take 1 tablet (500 mg total) by mouth daily.   doxycycline 100 MG capsule Commonly known as: VIBRAMYCIN Take 100 mg by mouth 2 (two) times daily.   Ensure Max Protein Liqd Take 330 mLs (11 oz total) by mouth 2 (two) times daily.   ferrous sulfate 325 (65 FE) MG tablet Take 1 tablet (325 mg total) by mouth daily with breakfast.   gabapentin 300 MG capsule Commonly known  as: NEURONTIN Take 300 mg by mouth 3 (three) times daily.   HYDROcodone-acetaminophen 5-325 MG tablet Commonly known as: NORCO/VICODIN Take 1 tablet by mouth every 6 (six) hours as needed for moderate pain (pain score 4-6).   insulin glargine 100 UNIT/ML injection Commonly known as: LANTUS Inject 0.15 mLs (15 Units total) into the skin at bedtime.   insulin lispro 100 UNIT/ML injection Commonly known as: HumaLOG Inject 0.03 mLs (3 Units total) into the skin 3 (three) times daily with meals. What changed: additional instructions   isosorbide mononitrate 60 MG 24 hr tablet Commonly known as: IMDUR Take 1 tablet (60 mg total) by mouth daily.   metFORMIN 500 MG tablet Commonly known as: GLUCOPHAGE Take by mouth daily.   metoprolol succinate 25 MG 24 hr tablet Commonly known as: TOPROL-XL Take 25 mg by mouth daily.   metoprolol tartrate 25 MG tablet Commonly known as: LOPRESSOR Take 25 mg by mouth 3 (three) times daily.   multivitamin with minerals Tabs tablet Take 1 tablet by mouth daily.   nitroGLYCERIN 0.4 MG SL tablet Commonly known as: NITROSTAT Place 1 tablet (0.4 mg total) under the tongue every 5 (five) minutes as needed for chest pain.   potassium chloride 20 MEQ packet Commonly known as: KLOR-CON Take by mouth 2 (two) times daily.   Rivaroxaban 15 MG Tabs tablet Commonly known as: XARELTO Take 1 tablet (  15 mg total) by mouth daily with supper. Hold Xarelto for 3 days.  Start on January 09, 2019.   senna 8.6 MG Tabs tablet Commonly known as: SENOKOT Take 1 tablet (8.6 mg total) by mouth daily as needed for mild constipation.   silver sulfADIAZINE 1 % cream Commonly known as: SILVADENE Apply 1 application topically daily.   Vitamin D-3 25 MCG (1000 UT) Caps Take by mouth.   zinc sulfate 220 (50 Zn) MG capsule Take 220 mg by mouth daily.       Vitals:   06/26/19 0733 06/26/19 1127  BP: (!) 150/66 (!) 107/50  Pulse: 66 (!) 55  Resp: 16 18  Temp:  98.4 F (36.9 C) 98.5 F (36.9 C)  SpO2: 91% 100%    Tele box removed and returned.Skin clean, dry and intact without evidence of skin break down, no evidence of skin tears noted. IV catheter discontinued intact. Site without signs and symptoms of complications. Dressing and pressure applied. Pt denies pain at this time. No complaints noted.  An After Visit Summary was printed and given to the patient. Patient escorted via [place], and D/C home via private auto.  [name]

## 2019-07-03 ENCOUNTER — Encounter (INDEPENDENT_AMBULATORY_CARE_PROVIDER_SITE_OTHER): Payer: Self-pay | Admitting: Vascular Surgery

## 2019-07-03 ENCOUNTER — Ambulatory Visit (INDEPENDENT_AMBULATORY_CARE_PROVIDER_SITE_OTHER): Payer: Medicare Other | Admitting: Vascular Surgery

## 2019-07-03 ENCOUNTER — Other Ambulatory Visit: Payer: Self-pay

## 2019-07-03 VITALS — BP 199/82 | HR 56 | Resp 16

## 2019-07-03 DIAGNOSIS — I10 Essential (primary) hypertension: Secondary | ICD-10-CM | POA: Diagnosis not present

## 2019-07-03 DIAGNOSIS — Z794 Long term (current) use of insulin: Secondary | ICD-10-CM

## 2019-07-03 DIAGNOSIS — E119 Type 2 diabetes mellitus without complications: Secondary | ICD-10-CM

## 2019-07-03 DIAGNOSIS — Z89511 Acquired absence of right leg below knee: Secondary | ICD-10-CM

## 2019-07-03 NOTE — Progress Notes (Signed)
MRN : [id number]  [name] is a 85 y.o. (24-Mar-1935) male who presents with chief complaint of  Chief Complaint  Patient presents with  . Follow-up    63month follow up  .  History of Present Illness: Patient returns today in follow up of his right BKA.  The wound is less than half the size that it was previously but not completely healed.  It looks clean and healthy.  No fevers or chills.  Current Outpatient Medications  Medication Sig Dispense Refill  . acetaminophen (TYLENOL) 325 MG tablet Take 650 mg by mouth every 6 (six) hours as needed.    Marland Kitchen amiodarone (PACERONE) 200 MG tablet Take 200 mg by mouth daily.    Marland Kitchen aspirin EC 81 MG tablet Take 1 tablet (81 mg total) by mouth daily. 150 tablet 2  . atorvastatin (LIPITOR) 80 MG tablet Take 80 mg by mouth every evening.    . chlorthalidone (HYGROTON) 25 MG tablet Take 25 mg by mouth daily.    . Cholecalciferol (VITAMIN D-3) 25 MCG (1000 UT) CAPS Take by mouth.    . diltiazem (CARDIZEM CD) 120 MG 24 hr capsule Take 120 mg by mouth daily.    Marland Kitchen doxycycline (VIBRAMYCIN) 100 MG capsule Take 100 mg by mouth 2 (two) times daily.    . Ensure Max Protein (ENSURE MAX PROTEIN) LIQD Take 330 mLs (11 oz total) by mouth 2 (two) times daily.    Marland Kitchen gabapentin (NEURONTIN) 300 MG capsule Take 300 mg by mouth 3 (three) times daily.     . insulin glargine (LANTUS) 100 UNIT/ML injection Inject 0.15 mLs (15 Units total) into the skin at bedtime. 1 vial 0  . insulin lispro (HUMALOG) 100 UNIT/ML injection Inject 0.03 mLs (3 Units total) into the skin 3 (three) times daily with meals. (Patient taking differently: Inject 3 Units into the skin 3 (three) times daily with meals. Plus 1 additional unit for every 50 BG points over 200 (max 5 additional units per dose)) 10 mL 0  . isosorbide mononitrate (IMDUR) 60 MG 24 hr tablet Take 1 tablet (60 mg total) by mouth daily. 30 tablet 0  . metFORMIN (GLUCOPHAGE) 500 MG tablet Take by mouth daily.    .  metoprolol succinate (TOPROL-XL) 25 MG 24 hr tablet Take 25 mg by mouth daily.    . metoprolol tartrate (LOPRESSOR) 25 MG tablet Take 25 mg by mouth 3 (three) times daily.    . Multiple Vitamin (MULTIVITAMIN WITH MINERALS) TABS tablet Take 1 tablet by mouth daily.    . nitroGLYCERIN (NITROSTAT) 0.4 MG SL tablet Place 1 tablet (0.4 mg total) under the tongue every 5 (five) minutes as needed for chest pain. 20 tablet 0  . potassium chloride (KLOR-CON) 20 MEQ packet Take by mouth 2 (two) times daily.    . Rivaroxaban (XARELTO) 15 MG TABS tablet Take 1 tablet (15 mg total) by mouth daily with supper. Hold Xarelto for 3 days.  Start on January 09, 2019. 30 tablet 1  . zinc sulfate 220 (50 Zn) MG capsule Take 220 mg by mouth daily.    . bisacodyl (DULCOLAX) 5 MG EC tablet Take 1 tablet (5 mg total) by mouth daily as needed for moderate constipation. (Patient not taking: Reported on 06/24/2019) 30 tablet 0  . collagenase (SANTYL) ointment Apply 1 application topically daily. (Patient not taking: Reported on 06/24/2019) 30 g 1  . divalproex (DEPAKOTE ER) 500 MG 24 hr tablet Take 1 tablet (500 mg  total) by mouth daily. (Patient not taking: Reported on 06/24/2019)    . ferrous sulfate 325 (65 FE) MG tablet Take 1 tablet (325 mg total) by mouth daily with breakfast. (Patient not taking: Reported on 04/13/2019) 30 tablet 3  . HYDROcodone-acetaminophen (NORCO/VICODIN) 5-325 MG tablet Take 1 tablet by mouth every 6 (six) hours as needed for moderate pain (pain score 4-6). (Patient not taking: Reported on 06/24/2019) 10 tablet 0  . senna (SENOKOT) 8.6 MG TABS tablet Take 1 tablet (8.6 mg total) by mouth daily as needed for mild constipation. (Patient not taking: Reported on 06/24/2019) 120 tablet 0  . silver sulfADIAZINE (SILVADENE) 1 % cream Apply 1 application topically daily. (Patient not taking: Reported on 06/24/2019) 50 g 0  . vitamin C (VITAMIN C) 250 MG tablet Take 1 tablet (250 mg total) by mouth 2 (two) times  daily. (Patient not taking: Reported on 06/24/2019)     Current Facility-Administered Medications  Medication Dose Route Frequency Provider Last Rate Last Admin  . diphenhydrAMINE (BENADRYL) injection 50 mg  50 mg Intravenous Once PRN Stegmayer, Kimberly A, PA-C      . famotidine (PEPCID) tablet 40 mg  40 mg Oral Once PRN Stegmayer, Abbe [place]        Past Medical History:  Diagnosis Date  . Arthritis   . Atrial fibrillation ([place])   . Carcinoma of prostate ([place])   . CHF (congestive heart failure) ([place])   . Chronic kidney disease   . Diabetes mellitus without complication ([place])   . ED (erectile dysfunction)   . Frequent urination   . Hyperlipidemia   . Hypertension   . Moderate mitral insufficiency   . Peripheral vascular disease ([place])   . Pneumonia 04/2016  . Prostate cancer ([place])   . Sleep apnea    OSA--USE C-PAP  . Ulcer of left foot due to type 2 diabetes mellitus ([place])   . Urinary stress incontinence, male     Past Surgical History:  Procedure Laterality Date  . AMPUTATION Left 01/12/2017   Procedure: AMPUTATION BELOW KNEE;  Surgeon: Algernon Huxley, MD;  Location: [facility] ORS;  Service: General;  Laterality: Left;  . AMPUTATION Right 12/27/2018   Procedure: AMPUTATION BELOW KNEE;  Surgeon: Algernon Huxley, MD;  Location: [facility];  Service: General;  Laterality: Right;  . APLIGRAFT PLACEMENT Left 10/15/2016   Procedure: APLIGRAFT PLACEMENT;  Surgeon: Albertine Patricia, DPM;  Location: [facility];  Service: Podiatry;  Laterality: Left;  necrotic ulcer  . CHOLECYSTECTOMY    . COLONOSCOPY WITH PROPOFOL N/A 01/02/2019   Procedure: COLONOSCOPY WITH PROPOFOL;  Surgeon: Lucilla Lame, MD;  Location: [facility] ENDOSCOPY;  Service: Endoscopy;  Laterality: N/A;  . COLONOSCOPY WITH PROPOFOL N/A 01/05/2019   Procedure: COLONOSCOPY WITH PROPOFOL;  Surgeon: Lucilla Lame, MD;  Location: [facility] ENDOSCOPY;  Service: Endoscopy;  Laterality: N/A;  . ESOPHAGOGASTRODUODENOSCOPY (EGD) WITH PROPOFOL N/A 01/02/2019    Procedure: ESOPHAGOGASTRODUODENOSCOPY (EGD) WITH PROPOFOL;  Surgeon: Lucilla Lame, MD;  Location: [facility] ENDOSCOPY;  Service: Endoscopy;  Laterality: N/A;  . EYE SURGERY Bilateral    Cataract Extraction with IOL  . HIP ARTHROPLASTY Right 10/13/2018   Procedure: ARTHROPLASTY BIPOLAR HIP (HEMIARTHROPLASTY);  Surgeon: Earnestine Leys, MD;  Location: [facility];  Service: Orthopedics;  Laterality: Right;  . IRRIGATION AND DEBRIDEMENT FOOT Left 10/15/2016   Procedure: IRRIGATION AND DEBRIDEMENT FOOT-EXCISIONAL DEBRIDEMENT OF SKIN 3RD, 4TH AND 5TH TOES WITH APPLICATION OF APLIGRAFT;  Surgeon: Albertine Patricia, DPM;  Location: [facility];  Service: Podiatry;  Laterality:  Left;  necrotic,gangrene  . IRRIGATION AND DEBRIDEMENT FOOT Left 12/09/2016   Procedure: IRRIGATION AND DEBRIDEMENT FOOT-MUSCLE/FASCIA, RESECTION OF FOURTH AND FIFTH METATARSAL NECROTIC BONE;  Surgeon: Albertine Patricia, DPM;  Location: [facility];  Service: Podiatry;  Laterality: Left;  . IRRIGATION AND DEBRIDEMENT FOOT Right 12/23/2018   Procedure: IRRIGATION AND DEBRIDEMENT FOOT and wound vac application;  Surgeon: Samara Deist, DPM;  Location: [facility];  Service: Podiatry;  Laterality: Right;  . LEFT HEART CATH N/A 06/25/2019   Procedure: Left Heart Cath and Coronary Angiography;  Surgeon: Isaias Cowman, MD;  Location: [place] [facility];  Service: Cardiovascular;  Laterality: N/A;  . LEFT HEART CATH AND CORONARY ANGIOGRAPHY N/A 06/05/2018   Procedure: LEFT HEART CATH AND CORONARY ANGIOGRAPHY;  Surgeon: Yolonda Kida, MD;  Location: [place] [facility];  Service: Cardiovascular;  Laterality: N/A;  . LOWER EXTREMITY ANGIOGRAPHY Left 08/16/2016   Procedure: Lower Extremity Angiography;  Surgeon: Algernon Huxley, MD;  Location: [place] [facility];  Service: Cardiovascular;  Laterality: Left;  . LOWER EXTREMITY ANGIOGRAPHY Left 09/01/2016   Procedure: Lower Extremity Angiography;  Surgeon: Algernon Huxley, MD;  Location: [place];   Service: Cardiovascular;  Laterality: Left;  . LOWER EXTREMITY ANGIOGRAPHY Left 10/14/2016   Procedure: Lower Extremity Angiography;  Surgeon: Algernon Huxley, MD;  Location: [place] [facility];  Service: Cardiovascular;  Laterality: Left;  . LOWER EXTREMITY ANGIOGRAPHY Left 12/20/2016   Procedure: Lower Extremity Angiography;  Surgeon: Algernon Huxley, MD;  Location: [place] [facility];  Service: Cardiovascular;  Laterality: Left;  . LOWER EXTREMITY ANGIOGRAPHY Right 12/18/2018   Procedure: LOWER EXTREMITY ANGIOGRAPHY;  Surgeon: Algernon Huxley, MD;  Location: [place] [facility];  Service: Cardiovascular;  Laterality: Right;  . LOWER EXTREMITY INTERVENTION  09/01/2016   Procedure: Lower Extremity Intervention;  Surgeon: Algernon Huxley, MD;  Location: [facility] [facility];  Service: Cardiovascular;;  . PROSTATE SURGERY     removal  . TONSILLECTOMY     as a child     Social History   Tobacco Use  . Smoking status: Former Smoker    Packs/day: 0.25    Types: Cigarettes    Quit date: 10/14/1994    Years since quitting: 24.7  . Smokeless tobacco: Never Used  Substance Use Topics  . Alcohol use: No    Alcohol/week: 0.0 standard drinks  . Drug use: No    Family History  Problem Relation Age of Onset  . Hypertension Mother   . Diabetes Mother   . Prostate cancer Neg Hx   . Bladder Cancer Neg Hx   . Kidney disease Neg Hx      Allergies  Allergen Reactions  . Contrast Media [Iodinated Diagnostic Agents] Shortness Of Breath    SOB  . Iohexol Shortness Of Breath     Desc: Respiratory Distress, Laryngedema, diaphoresis, Onset Date: [id number]   . Metrizamide Shortness Of Breath    SOB SOB SOB   . Latex Rash     REVIEW OF SYSTEMS (Negative unless checked) Constitutional: [] ??Weight loss[] ??Fever[] ??Chills Cardiac:[] ??Chest pain[] ??Atrial Fibrillation[] ??Palpitations [] ??Shortness of breath when laying flat [] ??Shortness of breath with exertion. [] ??Shortness of breath  at rest Vascular: [] ??Pain in legs with walking[] ??Pain in legswith standing[] ??Pain in legs when laying flat [] ??Claudication [] ??Pain in feet when laying flat [] ??History of DVT [] ??Phlebitis [] ??Swelling in legs [] ??Varicose veins [] ??Non-healing ulcers Pulmonary: [] ??Uses home oxygen [] ??Productive cough[] ??Hemoptysis [] ??Wheeze [] ??COPD [] ??Asthma Neurologic: [] ??Dizziness[] ??Seizures [] ??Blackouts[] ??History of stroke [] ??History of TIA[] ??Aphasia [] ??Temporary Blindness[] ??Weaknessor numbness in  arm [] ??Weakness or numbnessin leg Musculoskeletal:[] ??Joint swelling [] ??Joint pain [] ??Low back pain [] ??History of Knee Replacement [x] ??Arthritis [] ??back Surgeries[] ??Spinal Stenosis  Hematologic:[] ??Easy bruising[] ??Easy bleeding [] ??Hypercoagulable state [x] ??Anemic Gastrointestinal:[] ??Diarrhea [] ??Vomiting[] ??Gastroesophageal reflux/heartburn[] ??Difficulty swallowing. [] ??Abdominal pain Genitourinary: [x] ??Chronic kidney disease [] ??Difficulturination [] ??Anuric[] ??Blood in urine [] ??Frequenturination [] ??Burning with urination[] ??Hematuria Skin: [] ??Rashes [] ??Ulcers [] ??Wounds Psychological: [] ??History of anxiety[] ??History of major depression [x] ??Memory Difficulties  Physical Examination  BP (!) 199/82 (BP Location: Left Arm)   Pulse (!) 56   Resp 16  Gen:  WD/WN, NAD Head: [place]/AT, No temporalis wasting. Ear/Nose/Throat: Hearing grossly intact, nares w/o erythema or drainage Eyes: Conjunctiva clear. Sclera non-icteric Neck: Supple.  Trachea midline Pulmonary:  Good air movement, no use of accessory muscles.  Cardiac: irregular Vascular:  Vessel Right Left  Radial Palpable Palpable       Musculoskeletal: M/S 5/5 throughout.  Neurologic: Sensation grossly intact in extremities.  Symmetrical.  Speech is fluent.  Psychiatric: Judgment intact, Mood & affect appropriate for  pt's clinical situation. Dermatologic: wound on right BKA site clean and granulating, less than half the size it was at last check.       Labs Recent Results (from the past 2160 hour(s))  Basic metabolic panel     Status: Abnormal   Collection Time: 04/06/19 12:14 PM  Result Value Ref Range   Sodium 139 135 - 145 mmol/L   Potassium 3.7 3.5 - 5.1 mmol/L   Chloride 106 98 - 111 mmol/L   CO2 25 22 - 32 mmol/L   Glucose, Bld 323 (H) 70 - 99 mg/dL   BUN 31 (H) 8 - 23 mg/dL   Creatinine, Ser 1.02 0.61 - 1.24 mg/dL   Calcium 8.1 (L) 8.9 - 10.3 mg/dL   GFR calc non Af Amer >60 >60 mL/min   GFR calc Af Amer >60 >60 mL/min   Anion gap 8 5 - 15    Comment: Performed at [facility], [address] [place], [place] [address]  CBC with Differential     Status: Abnormal   Collection Time: 04/06/19 12:14 PM  Result Value Ref Range   WBC 9.7 4.0 - 10.5 K/uL   RBC 3.64 (L) 4.22 - 5.81 MIL/uL   Hemoglobin 10.3 (L) 13.0 - 17.0 g/dL   HCT 31.2 (L) 39.0 - 52.0 %   MCV 85.7 80.0 - 100.0 fL   MCH 28.3 26.0 - 34.0 pg   MCHC 33.0 30.0 - 36.0 g/dL   RDW 17.1 (H) 11.5 - 15.5 %   Platelets 253 150 - 400 K/uL   nRBC 0.0 0.0 - 0.2 %   Neutrophils Relative % 70 %   Neutro Abs 6.9 1.7 - 7.7 K/uL   Lymphocytes Relative 13 %   Lymphs Abs 1.2 0.7 - 4.0 K/uL   Monocytes Relative 7 %   Monocytes Absolute 0.7 0.1 - 1.0 K/uL   Eosinophils Relative 8 %   Eosinophils Absolute 0.7 (H) 0.0 - 0.5 K/uL   Basophils Relative 1 %   Basophils Absolute 0.1 0.0 - 0.1 K/uL   Immature Granulocytes 1 %   Abs Immature Granulocytes 0.05 0.00 - 0.07 K/uL    Comment: Performed at [facility], [place], [place] [address]  Troponin I (High Sensitivity)     Status: Abnormal   Collection Time: 04/06/19 12:14 PM  Result Value Ref Range   Troponin I (High Sensitivity) 37 (H) <18 ng/L    Comment: (NOTE) Elevated high sensitivity troponin I (hsTnI) values and significant  changes across  serial measurements  may suggest ACS but many other  chronic and acute conditions are known to elevate hsTnI results.  Refer to the "Links" section for chest pain algorithms and additional  guidance. Performed at [facility], [place], [place] [address]   Troponin I (High Sensitivity)     Status: Abnormal   Collection Time: 04/06/19  2:28 PM  Result Value Ref Range   Troponin I (High Sensitivity) 46 (H) <18 ng/L    Comment: (NOTE) Elevated high sensitivity troponin I (hsTnI) values and significant  changes across serial measurements may suggest ACS but many other  chronic and acute conditions are known to elevate hsTnI results.  Refer to the "Links" section for chest pain algorithms and additional  guidance. Performed at [facility], [address] [place], [place] [id number]   Basic metabolic panel     Status: Abnormal   Collection Time: 06/24/19  2:05 AM  Result Value Ref Range   Sodium 134 (L) 135 - 145 mmol/L   Potassium 4.1 3.5 - 5.1 mmol/L   Chloride 99 98 - 111 mmol/L   CO2 26 22 - 32 mmol/L   Glucose, Bld 526 (HH) 70 - 99 mg/dL    Comment: CRITICAL RESULT CALLED TO, READ BACK BY AND VERIFIED WITH APRIL BRUMGARD ON 06/24/19 AT 0256 [facility]    BUN 30 (H) 8 - 23 mg/dL   Creatinine, Ser 1.40 (H) 0.61 - 1.24 mg/dL   Calcium 8.8 (L) 8.9 - 10.3 mg/dL   GFR calc non Af Amer 46 (L) >60 mL/min   GFR calc Af Amer 54 (L) >60 mL/min   Anion gap 9 5 - 15    Comment: Performed at [facility], [address] [place], [place] [address]  CBC with Differential     Status: Abnormal   Collection Time: 06/24/19  2:05 AM  Result Value Ref Range   WBC 10.7 (H) 4.0 - 10.5 K/uL   RBC 4.99 4.22 - 5.81 MIL/uL   Hemoglobin 14.2 13.0 - 17.0 g/dL   HCT 42.6 39.0 - 52.0 %   MCV 85.4 80.0 - 100.0 fL   MCH 28.5 26.0 - 34.0 pg   MCHC 33.3 30.0 - 36.0 g/dL   RDW 12.8 11.5 - 15.5 %   Platelets 265 150 - 400 K/uL   nRBC 0.0 0.0 - 0.2 %   Neutrophils  Relative % 73 %   Neutro Abs 7.9 (H) 1.7 - 7.7 K/uL   Lymphocytes Relative 8 %   Lymphs Abs 0.9 0.7 - 4.0 K/uL   Monocytes Relative 7 %   Monocytes Absolute 0.7 0.1 - 1.0 K/uL   Eosinophils Relative 10 %   Eosinophils Absolute 1.0 (H) 0.0 - 0.5 K/uL   Basophils Relative 1 %   Basophils Absolute 0.1 0.0 - 0.1 K/uL   Immature Granulocytes 1 %   Abs Immature Granulocytes 0.05 0.00 - 0.07 K/uL    Comment: Performed at [facility], [place], [place] [address]  Troponin I (High Sensitivity)     Status: Abnormal   Collection Time: 06/24/19  2:05 AM  Result Value Ref Range   Troponin I (High Sensitivity) 34 (H) <18 ng/L    Comment: (NOTE) Elevated high sensitivity troponin I (hsTnI) values and significant  changes across serial measurements may suggest ACS but many other  chronic and acute conditions are known to elevate hsTnI results.  Refer to the "Links" section for chest pain algorithms and additional  guidance. Performed at [facility], [address] [place], [place] [address]   APTT     Status: None   Collection Time: 06/24/19  2:05 AM  Result Value Ref Range   aPTT 30 24 - 36 seconds    Comment: Performed at [facility], [address] [place], [place] [address]  Protime-INR     Status: None   Collection Time: 06/24/19  2:05 AM  Result Value Ref Range   Prothrombin Time 14.5 11.4 - 15.2 seconds   INR 1.1 0.8 - 1.2    Comment: (NOTE) INR goal varies based on device and disease states. Performed at [facility], [address], [place], [place] [address]   Respiratory Panel by RT PCR (Flu A&B, Covid) - Nasopharyngeal Swab     Status: None   Collection Time: 06/24/19  2:06 AM   Specimen: Nasopharyngeal Swab  Result Value Ref Range   SARS Coronavirus 2 by RT PCR NEGATIVE NEGATIVE    Comment: (NOTE) SARS-CoV-2 target nucleic acids are NOT DETECTED. The SARS-CoV-2 RNA is generally detectable in upper  respiratoy specimens during the acute phase of infection. The lowest concentration of SARS-CoV-2 viral copies this assay can detect is 131 copies/mL. A negative result does not preclude SARS-Cov-2 infection and should not be used as the sole basis for treatment or other patient management decisions. A negative result may occur with  improper specimen collection/handling, submission of specimen other than nasopharyngeal swab, presence of viral mutation(s) within the areas targeted by this assay, and inadequate number of viral copies (<131 copies/mL). A negative result must be combined with clinical observations, patient history, and epidemiological information. The expected result is Negative. Fact Sheet for Patients:  PinkCheek.be Fact Sheet for Healthcare Providers:  GravelBags.it This test is not yet ap proved or cleared by the [place] FDA and  has been authorized for detection and/or diagnosis of SARS-CoV-2 by FDA under an Emergency Use Authorization (EUA). This EUA will remain  in effect (meaning this test can be used) for the duration of the COVID-19 declaration under Section 564(b)(1) of the Act, 21 U.S.C. section 360bbb-3(b)(1), unless the authorization is terminated or revoked sooner.    Influenza A by PCR NEGATIVE NEGATIVE   Influenza B by PCR NEGATIVE NEGATIVE    Comment: (NOTE) The Xpert Xpress SARS-CoV-2/FLU/RSV assay is intended as an aid in  the diagnosis of influenza from Nasopharyngeal swab specimens and  should not be used as a sole basis for treatment. Nasal washings and  aspirates are unacceptable for Xpert Xpress SARS-CoV-2/FLU/RSV  testing. Fact Sheet for Patients: PinkCheek.be Fact Sheet for Healthcare Providers: GravelBags.it This test is not yet approved or cleared by the [place] FDA and  has been authorized for detection and/or  diagnosis of SARS-CoV-2 by  FDA under an Emergency Use Authorization (EUA). This EUA will remain  in effect (meaning this test can be used) for the duration of the  Covid-19 declaration under Section 564(b)(1) of the Act, 21  U.S.C. section 360bbb-3(b)(1), unless the authorization is  terminated or revoked. Performed at [facility], [address] [place], [place] [address]   Troponin I (High Sensitivity)     Status: Abnormal   Collection Time: 06/24/19  3:59 AM  Result Value Ref Range   Troponin I (High Sensitivity) 62 (H) <18 ng/L    Comment: READ BACK AND VERIFIED WITH APRIL BRUMGARD ON 06/24/19 AT 0450 [place] (NOTE) Elevated high sensitivity troponin I (hsTnI) values and significant  changes across serial measurements may suggest ACS but many other  chronic and acute conditions are known to elevate hsTnI results.  Refer to the "Links" section for chest pain algorithms and additional  guidance. Performed at [facility], [address] [place], [place] [address]   Glucose, capillary     Status: Abnormal   Collection Time: 06/24/19  4:34 AM  Result Value Ref Range   Glucose-Capillary 331 (H) 70 - 99 mg/dL  Heparin level (unfractionated)     Status: Abnormal   Collection Time: 06/24/19  5:59 AM  Result Value Ref Range   Heparin Unfractionated <0.10 (L) 0.30 - 0.70 IU/mL    Comment: (NOTE) If heparin results are below expected values, and patient dosage has  been confirmed, suggest follow up testing of antithrombin III levels. Performed at [facility], [address] [place], [place] [address]   Glucose, capillary     Status: Abnormal   Collection Time: 06/24/19  8:04 AM  Result Value Ref Range   Glucose-Capillary 263 (H) 70 - 99 mg/dL  Glucose, capillary     Status: Abnormal   Collection Time: 06/24/19 10:45 AM  Result Value Ref Range   Glucose-Capillary 297 (H) 70 - 99 mg/dL  Hemoglobin A1c     Status: Abnormal   Collection Time:  06/24/19 10:47 AM  Result Value Ref Range   Hgb A1c MFr Bld 9.4 (H) 4.8 - 5.6 %    Comment: (NOTE) Pre diabetes:          5.7%-6.4% Diabetes:              >6.4% Glycemic control for   <7.0% adults with diabetes    Mean Plasma Glucose 223.08 mg/dL    Comment: Performed at [facility] [address] [place], [place] [address]  Troponin I (High Sensitivity)     Status: Abnormal   Collection Time: 06/24/19 10:47 AM  Result Value Ref Range   Troponin I (High Sensitivity) 116 (HH) <18 ng/L    Comment: CRITICAL RESULT CALLED TO, READ BACK BY AND VERIFIED WITH TAMMY TODD ON 06/24/19 AT 1143 BY [facility] (NOTE) Elevated high sensitivity troponin I (hsTnI) values and significant  changes across serial measurements may suggest ACS but many other  chronic and acute conditions are known to elevate hsTnI results.  Refer to the "Links" section for chest pain algorithms and additional  guidance. Performed at [facility], [name] [place],  [address]   Glucose, capillary     Status: Abnormal   Collection Time: 06/24/19 11:49 AM  Result Value Ref Range   Glucose-Capillary 299 (H) 70 - 99 mg/dL  Brain natriuretic peptide     Status: Abnormal   Collection Time: 06/24/19  1:46 PM  Result Value Ref Range   B Natriuretic Peptide 887.0 (H) 0.0 - 100.0 pg/mL    Comment: Performed at [facility], [address] [place], [place] [address]  Glucose, capillary     Status: Abnormal   Collection Time: 06/24/19  3:11 PM  Result Value Ref Range   Glucose-Capillary 206 (H) 70 - 99 mg/dL  Heparin level (unfractionated)     Status: Abnormal   Collection Time: 06/24/19  3:52 PM  Result Value Ref Range   Heparin Unfractionated 0.17 (L) 0.30 - 0.70 IU/mL    Comment: (NOTE) If heparin results are below expected values, and patient dosage has  been confirmed, suggest follow up testing of antithrombin III levels. Performed at  [facility], [address]  [place], [place] [address]   Glucose, capillary     Status: Abnormal   Collection Time: 06/24/19  4:57 PM  Result Value Ref Range   Glucose-Capillary 108 (H) 70 - 99 mg/dL  Glucose, capillary     Status: Abnormal   Collection Time: 06/24/19  8:33 PM  Result Value Ref Range   Glucose-Capillary 193 (H) 70 - 99 mg/dL  Troponin I (High Sensitivity)     Status: Abnormal   Collection Time: 06/25/19  1:19 AM  Result Value Ref Range   Troponin I (High Sensitivity) 73 (H) <18 ng/L    Comment: (NOTE) Elevated high sensitivity troponin I (hsTnI) values and significant  changes across serial measurements may suggest ACS but many other  chronic and acute conditions are known to elevate hsTnI results.  Refer to the "Links" section for chest pain algorithms and additional  guidance. Performed at [facility], [address] [place], [name] [id number]   Basic metabolic panel     Status: Abnormal   Collection Time: 06/25/19  1:19 AM  Result Value Ref Range   Sodium 139 135 - 145 mmol/L   Potassium 3.8 3.5 - 5.1 mmol/L   Chloride 101 98 - 111 mmol/L   CO2 30 22 - 32 mmol/L   Glucose, Bld 174 (H) 70 - 99 mg/dL   BUN 23 8 - 23 mg/dL   Creatinine, Ser 1.24 0.61 - 1.24 mg/dL   Calcium 8.7 (L) 8.9 - 10.3 mg/dL   GFR calc non Af Amer 54 (L) >60 mL/min   GFR calc Af Amer >60 >60 mL/min   Anion gap 8 5 - 15    Comment: Performed at [facility], [address] [place], [place] [address]  Lipid panel     Status: Abnormal   Collection Time: 06/25/19  1:19 AM  Result Value Ref Range   Cholesterol 121 0 - 200 mg/dL   Triglycerides 57 <150 mg/dL   HDL 34 (L) >40 mg/dL   Total CHOL/HDL Ratio 3.6 RATIO   VLDL 11 0 - 40 mg/dL   LDL Cholesterol 76 0 - 99 mg/dL    Comment:        Total Cholesterol/HDL:CHD Risk Coronary Heart Disease Risk Table                     Men   Women  1/2 Average Risk   3.4   3.3  Average Risk       5.0   4.4  2 X Average Risk   9.6   7.1  3 X Average Risk   23.4   11.0        Use the calculated Patient Ratio above and the CHD Risk Table to determine the patient's CHD Risk.        ATP III CLASSIFICATION (LDL):  <100     mg/dL   Optimal  100-129  mg/dL   Near or Above                    Optimal  130-159  mg/dL   Borderline  160-189  mg/dL   High  >190     mg/dL   Very High Performed at [facility], [address] [place], [place] [address]   CBC     Status: Abnormal   Collection Time: 06/25/19  1:19 AM  Result Value Ref Range   WBC 13.2 (H) 4.0 -  10.5 K/uL   RBC 4.50 4.22 - 5.81 MIL/uL   Hemoglobin 13.0 13.0 - 17.0 g/dL   HCT 38.4 (L) 39.0 - 52.0 %   MCV 85.3 80.0 - 100.0 fL   MCH 28.9 26.0 - 34.0 pg   MCHC 33.9 30.0 - 36.0 g/dL   RDW 13.0 11.5 - 15.5 %   Platelets 268 150 - 400 K/uL   nRBC 0.0 0.0 - 0.2 %    Comment: Performed at [facility], [address] [place], [place] [address]  Heparin level (unfractionated)     Status: None   Collection Time: 06/25/19  1:19 AM  Result Value Ref Range   Heparin Unfractionated 0.53 0.30 - 0.70 IU/mL    Comment: (NOTE) If heparin results are below expected values, and patient dosage has  been confirmed, suggest follow up testing of antithrombin III levels. Performed at [facility], [address] [place], [place] [address]   Glucose, capillary     Status: Abnormal   Collection Time: 06/25/19  8:12 AM  Result Value Ref Range   Glucose-Capillary 114 (H) 70 - 99 mg/dL  Glucose, capillary     Status: Abnormal   Collection Time: 06/25/19 11:57 AM  Result Value Ref Range   Glucose-Capillary 155 (H) 70 - 99 mg/dL  ECHOCARDIOGRAM COMPLETE     Status: None   Collection Time: 06/25/19  1:42 PM  Result Value Ref Range   Weight 2,740.76 oz   Height 67 in   BP 128/55 mmHg  Glucose, capillary     Status: Abnormal   Collection Time: 06/25/19  4:17 PM  Result Value Ref Range   Glucose-Capillary 193 (H) 70 - 99 mg/dL  Glucose, capillary     Status: Abnormal    Collection Time: 06/25/19  9:21 PM  Result Value Ref Range   Glucose-Capillary 338 (H) 70 - 99 mg/dL  Glucose, capillary     Status: Abnormal   Collection Time: 06/26/19  7:36 AM  Result Value Ref Range   Glucose-Capillary 248 (H) 70 - 99 mg/dL  Glucose, capillary     Status: Abnormal   Collection Time: 06/26/19 11:29 AM  Result Value Ref Range   Glucose-Capillary 202 (H) 70 - 99 mg/dL    Radiology CARDIAC CATHETERIZATION  Result Date: 06/25/2019  Ost LAD lesion is 100% stenosed.  Ost Cx lesion is 30% stenosed.  Prox Cx lesion is 30% stenosed.  Mid Cx lesion is 30% stenosed.  Ost RCA to Prox RCA lesion is 50% stenosed.  1.  One-vessel CAD with chronically occluded ostial LAD, unchanged compared to prior cardiac catheterization 06/05/2018 2.  Mild to moderate reduced left ventricular function with estimated LV ejection fraction 35 to 40% Recommendations 1.  Continue medical therapy 2.  Aggressive risk factor modification 3.  Resume Xarelto for stroke prevention 4.  DC heparin 5.  Discharge home later today 6.  Follow-up with Dr. Nehemiah Massed in 1 to 2 weeks   DG Chest Portable 1 View  Result Date: 06/24/2019 CLINICAL DATA:  Chest pain, code STEMI EXAM: PORTABLE CHEST 1 VIEW COMPARISON:  Radiograph 02/08/2019 FINDINGS: Diffuse hazy interstitial opacities with redistribution of the pulmonary vascularity, fissural and septal thickening and cardiomegaly. Chronic areas of scarring are noted in the right lung apex. The aorta is calcified. No visible pneumothorax or effusion. No acute osseous or soft tissue abnormality. Defibrillator pads and telemetry leads overlie the chest. IMPRESSION: Features compatible with heart failure and/or volume overload with pulmonary edema and cardiomegaly.  No visible effusions. Chronic areas of scarring particularly towards the lung apices. Electronically Signed   By: Lovena Le M.D.   On: 06/24/2019 03:05   ECHOCARDIOGRAM COMPLETE  Result Date: 06/25/2019    ECHOCARDIOGRAM REPORT   Patient Name:   [name] Date of Exam: 06/25/2019 Medical Rec #:  [id number]             Height:       67.0 in Accession #:    [id number]            Weight:       171.3 lb Date of Birth:  Dec 04, 1934            BSA:          1.89 m Patient Age:    58 years              BP:           124/64 mmHg Patient Gender: M                     HR:           82 bpm. Exam Location:  [facility] Procedure: 2D Echo, Color Doppler and Cardiac Doppler Indications:     Chest pain 786.50  History:         Patient has prior history of Echocardiogram examinations, most                  recent 06/03/2018. Risk Factors:Hypertension, Dyslipidemia and                  Diabetes. CKD, PVD, Pt was covid positive during echo.  Sonographer:     Sherrie Sport RDCS (AE) Referring Phys:  [place] Diagnosing Phys: Yolonda Kida MD IMPRESSIONS  1. Left ventricular ejection fraction, by visual estimation, is 60 to 65%. The left ventricle has normal function. There is borderline left ventricular hypertrophy.  2. Left ventricular diastolic parameters are consistent with Grade I diastolic dysfunction (impaired relaxation).  3. The left ventricle has no regional wall motion abnormalities.  4. Global right ventricle has normal systolic function.The right ventricular size is normal. No increase in right ventricular wall thickness.  5. Left atrial size was normal.  6. Right atrial size was normal.  7. 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  8. The tricuspid valve is normal in structure.  9. The tricuspid valve is normal in structure. Tricuspid valve regurgitation is trivial. 10. The aortic valve is abnormal. Aortic valve regurgitation is not visualized. 11. The pulmonic valve was grossly normal. Pulmonic valve regurgitation is not visualized. 12. Normal pulmonary artery systolic pressure. FINDINGS  Left Ventricle: Left ventricular ejection fraction, by visual estimation, is 60 to 65%.  The left ventricle has normal function. The left ventricle has no regional wall motion abnormalities. There is borderline left ventricular hypertrophy. Left ventricular diastolic parameters are consistent with Grade I diastolic dysfunction (impaired relaxation). Right Ventricle: The right ventricular size is normal. No increase in right ventricular wall thickness. Global RV systolic function is has normal systolic function. The tricuspid regurgitant velocity is 1.49 m/s, and with an assumed right atrial pressure  of 10 mmHg, the estimated right ventricular systolic pressure is normal at 18.9 mmHg. Left Atrium: Left atrial size was normal in size. Right Atrium: Right atrial size was normal in size Pericardium: There is no evidence of pericardial effusion. Mitral Valve: The mitral valve is myxomatous. Trivial mitral  valve regurgitation. Tricuspid Valve: The tricuspid valve is normal in structure. Tricuspid valve regurgitation is trivial. Aortic Valve: The aortic valve is abnormal. Aortic valve regurgitation is not visualized. Aortic valve mean gradient measures 3.5 mmHg. Aortic valve peak gradient measures 6.2 mmHg. Aortic valve area, by VTI measures 2.81 cm. Pulmonic Valve: The pulmonic valve was grossly normal. Pulmonic valve regurgitation is not visualized. Pulmonic regurgitation is not visualized. Aorta: The aortic root is normal in size and structure. IAS/Shunts: No atrial level shunt detected by color flow Doppler.  LEFT VENTRICLE PLAX 2D LVIDd:         5.94 cm  Diastology LVIDs:         3.77 cm  LV e' lateral:   4.35 cm/s LV PW:         1.68 cm  LV E/e' lateral: 12.6 LV IVS:        1.23 cm  LV e' medial:    4.90 cm/s LVOT diam:     2.20 cm  LV E/e' medial:  11.2 LV SV:         115 ml LV SV Index:   59.59 LVOT Area:     3.80 cm  RIGHT VENTRICLE RV Basal diam:  2.30 cm RV S prime:     10.70 cm/s TAPSE (M-mode): 3.3 cm LEFT ATRIUM             Index       RIGHT ATRIUM           Index LA diam:        4.70 cm 2.48  cm/m  RA Area:     12.50 cm LA Vol (A2C):   51.7 ml 27.31 ml/m RA Volume:   22.90 ml  12.09 ml/m LA Vol (A4C):   61.3 ml 32.38 ml/m LA Biplane Vol: 57.1 ml 30.16 ml/m  AORTIC VALVE                   PULMONIC VALVE AV Area (Vmax):    2.22 cm    PV Vmax:        0.89 m/s AV Area (Vmean):   2.38 cm    PV Peak grad:   3.2 mmHg AV Area (VTI):     2.81 cm    RVOT Peak grad: 1 mmHg AV Vmax:           124.00 cm/s AV Vmean:          85.900 cm/s AV VTI:            0.279 m AV Peak Grad:      6.2 mmHg AV Mean Grad:      3.5 mmHg LVOT Vmax:         72.40 cm/s LVOT Vmean:        53.800 cm/s LVOT VTI:          0.206 m LVOT/AV VTI ratio: 0.74  AORTA Ao Root diam: 3.00 cm MITRAL VALVE                        TRICUSPID VALVE MV Area (PHT): 2.18 cm             TR Peak grad:   8.9 mmHg MV PHT:        100.92 msec          TR Vmax:        149.00 cm/s MV Decel Time: 348 msec MV E velocity: 54.80 cm/s 103 cm/s  SHUNTS MV A velocity: 98.00 cm/s 70.3 cm/s Systemic VTI:  0.21 m MV E/A ratio:  0.56       1.5       Systemic Diam: 2.20 cm  Dwayne Prince Rome MD Electronically signed by Yolonda Kida MD Signature Date/Time: 06/25/2019/4:45:19 PM    Final     Assessment/Plan Essential hypertension blood pressure control important in reducing the progression of atherosclerotic disease. On appropriate oral medications.   Type 2 diabetes mellitus treated with insulin (HCC) blood glucose control important in reducing the progression of atherosclerotic disease. Also, involved in wound healing. On appropriate medications.   Hx of BKA, right ([place]) Continues to improve.  Also doing a much better job of straightening his knee.  My hope is within about a month, his wound will be healed or almost completely healed so that we can make a referral to the prosthetics company for further evaluation for her right below-knee amputation prosthesis.    Leotis Pain, MD  07/03/2019 9:44 AM    This note was created with Dragon medical  transcription system.  Any errors from dictation are purely unintentional

## 2019-08-14 ENCOUNTER — Ambulatory Visit (INDEPENDENT_AMBULATORY_CARE_PROVIDER_SITE_OTHER): Payer: Medicare Other | Admitting: Vascular Surgery

## 2019-08-17 ENCOUNTER — Ambulatory Visit (INDEPENDENT_AMBULATORY_CARE_PROVIDER_SITE_OTHER): Payer: Medicare Other | Admitting: Vascular Surgery

## 2019-08-17 ENCOUNTER — Encounter (INDEPENDENT_AMBULATORY_CARE_PROVIDER_SITE_OTHER): Payer: Self-pay | Admitting: Vascular Surgery

## 2019-08-17 ENCOUNTER — Other Ambulatory Visit: Payer: Self-pay

## 2019-08-17 VITALS — BP 162/80 | HR 51 | Resp 14 | Ht 67.0 in | Wt 180.0 lb

## 2019-08-17 DIAGNOSIS — Z794 Long term (current) use of insulin: Secondary | ICD-10-CM

## 2019-08-17 DIAGNOSIS — E119 Type 2 diabetes mellitus without complications: Secondary | ICD-10-CM | POA: Diagnosis not present

## 2019-08-17 DIAGNOSIS — I10 Essential (primary) hypertension: Secondary | ICD-10-CM

## 2019-08-17 DIAGNOSIS — T879 Unspecified complications of amputation stump: Secondary | ICD-10-CM | POA: Diagnosis not present

## 2019-08-17 NOTE — Progress Notes (Signed)
MRN : [id number]  [name] is a 85 y.o. (September 18, 1934) male who presents with chief complaint of  Chief Complaint  Patient presents with  . Follow-up    6 mo  no studies  .  History of Present Illness: Patient returns today in follow up of his right BKA wound.  He is a very poor historian so his son provides most of the history.  His wound is almost completely healed.  There is 2 small areas of granulation tissue medially and 2 small to medium size scabs laterally.  Otherwise it has epithelialized.  No erythema or drainage.  Current Outpatient Medications  Medication Sig Dispense Refill  . diltiazem (CARDIZEM CD) 120 MG 24 hr capsule Take 120 mg by mouth daily.    . Ensure Max Protein (ENSURE MAX PROTEIN) LIQD Take 330 mLs (11 oz total) by mouth 2 (two) times daily.    . ferrous sulfate 325 (65 FE) MG tablet Take 1 tablet (325 mg total) by mouth daily with breakfast. 30 tablet 3  . insulin glargine (LANTUS) 100 UNIT/ML injection Inject 0.15 mLs (15 Units total) into the skin at bedtime. 1 vial 0  . insulin lispro (HUMALOG) 100 UNIT/ML injection Inject 0.03 mLs (3 Units total) into the skin 3 (three) times daily with meals. (Patient taking differently: Inject 3 Units into the skin 3 (three) times daily with meals. Plus 1 additional unit for every 50 BG points over 200 (max 5 additional units per dose)) 10 mL 0  . isosorbide mononitrate (IMDUR) 60 MG 24 hr tablet Take 1 tablet (60 mg total) by mouth daily. 30 tablet 0  . metoprolol succinate (TOPROL-XL) 25 MG 24 hr tablet Take 25 mg by mouth daily.    . metoprolol tartrate (LOPRESSOR) 25 MG tablet Take 25 mg by mouth 3 (three) times daily.    . Multiple Vitamin (MULTIVITAMIN WITH MINERALS) TABS tablet Take 1 tablet by mouth daily.    . nitroGLYCERIN (NITROSTAT) 0.4 MG SL tablet Place 1 tablet (0.4 mg total) under the tongue every 5 (five) minutes as needed for chest pain. 20 tablet 0  . Rivaroxaban (XARELTO) 15 MG TABS tablet  Take 1 tablet (15 mg total) by mouth daily with supper. Hold Xarelto for 3 days.  Start on January 09, 2019. 30 tablet 1  . senna (SENOKOT) 8.6 MG TABS tablet Take 1 tablet (8.6 mg total) by mouth daily as needed for mild constipation. 120 tablet 0  . acetaminophen (TYLENOL) 325 MG tablet Take 650 mg by mouth every 6 (six) hours as needed.    Marland Kitchen amiodarone (PACERONE) 200 MG tablet Take 200 mg by mouth daily.    Marland Kitchen aspirin EC 81 MG tablet Take 1 tablet (81 mg total) by mouth daily. 150 tablet 2  . atorvastatin (LIPITOR) 80 MG tablet Take 80 mg by mouth every evening.    . bisacodyl (DULCOLAX) 5 MG EC tablet Take 1 tablet (5 mg total) by mouth daily as needed for moderate constipation. (Patient not taking: Reported on 06/24/2019) 30 tablet 0  . chlorthalidone (HYGROTON) 25 MG tablet Take 25 mg by mouth daily.    . Cholecalciferol (VITAMIN D-3) 25 MCG (1000 UT) CAPS Take by mouth.    . collagenase (SANTYL) ointment Apply 1 application topically daily. (Patient not taking: Reported on 06/24/2019) 30 g 1  . divalproex (DEPAKOTE ER) 500 MG 24 hr tablet Take 1 tablet (500 mg total) by mouth daily. (Patient not taking: Reported on 06/24/2019)    .  doxycycline (VIBRAMYCIN) 100 MG capsule Take 100 mg by mouth 2 (two) times daily.    Marland Kitchen gabapentin (NEURONTIN) 300 MG capsule Take 300 mg by mouth 3 (three) times daily.     Marland Kitchen HYDROcodone-acetaminophen (NORCO/VICODIN) 5-325 MG tablet Take 1 tablet by mouth every 6 (six) hours as needed for moderate pain (pain score 4-6). (Patient not taking: Reported on 06/24/2019) 10 tablet 0  . metFORMIN (GLUCOPHAGE) 500 MG tablet Take by mouth daily.    . potassium chloride (KLOR-CON) 20 MEQ packet Take by mouth 2 (two) times daily.    . silver sulfADIAZINE (SILVADENE) 1 % cream Apply 1 application topically daily. (Patient not taking: Reported on 06/24/2019) 50 g 0  . vitamin C (VITAMIN C) 250 MG tablet Take 1 tablet (250 mg total) by mouth 2 (two) times daily. (Patient not taking:  Reported on 06/24/2019)    . zinc sulfate 220 (50 Zn) MG capsule Take 220 mg by mouth daily.     Current Facility-Administered Medications  Medication Dose Route Frequency Provider Last Rate Last Admin  . diphenhydrAMINE (BENADRYL) injection 50 mg  50 mg Intravenous Once PRN Stegmayer, Kimberly A, PA-C      . famotidine (PEPCID) tablet 40 mg  40 mg Oral Once PRN Stegmayer, Abbe [place]        Past Medical History:  Diagnosis Date  . Arthritis   . Atrial fibrillation ([place])   . Carcinoma of prostate ([place])   . CHF (congestive heart failure) ([place])   . Chronic kidney disease   . Diabetes mellitus without complication ([place])   . ED (erectile dysfunction)   . Frequent urination   . Hyperlipidemia   . Hypertension   . Moderate mitral insufficiency   . Peripheral vascular disease ([place])   . Pneumonia 04/2016  . Prostate cancer ([place])   . Sleep apnea    OSA--USE C-PAP  . Ulcer of left foot due to type 2 diabetes mellitus ([place])   . Urinary stress incontinence, male     Past Surgical History:  Procedure Laterality Date  . AMPUTATION Left 01/12/2017   Procedure: AMPUTATION BELOW KNEE;  Surgeon: Algernon Huxley, MD;  Location: [facility] ORS;  Service: General;  Laterality: Left;  . AMPUTATION Right 12/27/2018   Procedure: AMPUTATION BELOW KNEE;  Surgeon: Algernon Huxley, MD;  Location: [facility] OR[facility];  Service: General;  Laterality: Right;  . APLIGRAFT PLACEMENT Left 10/15/2016   Procedure: APLIGRAFT PLACEMENT;  Surgeon: Albertine Patricia, DPM;  Location: [facility];  Service: Podiatry;  Laterality: Left;  necrotic ulcer  . CHOLECYSTECTOMY    . COLONOSCOPY WITH PROPOFOL N/A 01/02/2019   Procedure: COLONOSCOPY WITH PROPOFOL;  Surgeon: Lucilla Lame, MD;  Location: [facility] ENDOSCOPY;  Service: Endoscopy;  Laterality: N/A;  . COLONOSCOPY WITH PROPOFOL N/A 01/05/2019   Procedure: COLONOSCOPY WITH PROPOFOL;  Surgeon: Lucilla Lame, MD;  Location: [facility] ENDOSCOPY;  Service: Endoscopy;  Laterality: N/A;  .  ESOPHAGOGASTRODUODENOSCOPY (EGD) WITH PROPOFOL N/A 01/02/2019   Procedure: ESOPHAGOGASTRODUODENOSCOPY (EGD) WITH PROPOFOL;  Surgeon: Lucilla Lame, MD;  Location: [facility] ENDOSCOPY;  Service: Endoscopy;  Laterality: N/A;  . EYE SURGERY Bilateral    Cataract Extraction with IOL  . HIP ARTHROPLASTY Right 10/13/2018   Procedure: ARTHROPLASTY BIPOLAR HIP (HEMIARTHROPLASTY);  Surgeon: Earnestine Leys, MD;  Location: [facility];  Service: Orthopedics;  Laterality: Right;  . IRRIGATION AND DEBRIDEMENT FOOT Left 10/15/2016   Procedure: IRRIGATION AND DEBRIDEMENT FOOT-EXCISIONAL DEBRIDEMENT OF SKIN 3RD, 4TH AND 5TH TOES WITH APPLICATION OF APLIGRAFT;  Surgeon: Albertine Patricia,  DPM;  Location: [facility];  Service: Podiatry;  Laterality: Left;  necrotic,gangrene  . IRRIGATION AND DEBRIDEMENT FOOT Left 12/09/2016   Procedure: IRRIGATION AND DEBRIDEMENT FOOT-MUSCLE/FASCIA, RESECTION OF FOURTH AND FIFTH METATARSAL NECROTIC BONE;  Surgeon: Albertine Patricia, DPM;  Location: [facility];  Service: Podiatry;  Laterality: Left;  . IRRIGATION AND DEBRIDEMENT FOOT Right 12/23/2018   Procedure: IRRIGATION AND DEBRIDEMENT FOOT and wound vac application;  Surgeon: Samara Deist, DPM;  Location: [facility];  Service: Podiatry;  Laterality: Right;  . LEFT HEART CATH N/A 06/25/2019   Procedure: Left Heart Cath and Coronary Angiography;  Surgeon: Isaias Cowman, MD;  Location: [place] [facility];  Service: Cardiovascular;  Laterality: N/A;  . LEFT HEART CATH AND CORONARY ANGIOGRAPHY N/A 06/05/2018   Procedure: LEFT HEART CATH AND CORONARY ANGIOGRAPHY;  Surgeon: Yolonda Kida, MD;  Location: [place] [facility];  Service: Cardiovascular;  Laterality: N/A;  . LOWER EXTREMITY ANGIOGRAPHY Left 08/16/2016   Procedure: Lower Extremity Angiography;  Surgeon: Algernon Huxley, MD;  Location: [place] [facility];  Service: Cardiovascular;  Laterality: Left;  . LOWER EXTREMITY ANGIOGRAPHY Left 09/01/2016   Procedure: Lower Extremity Angiography;   Surgeon: Algernon Huxley, MD;  Location: [place] [facility];  Service: Cardiovascular;  Laterality: Left;  . LOWER EXTREMITY ANGIOGRAPHY Left 10/14/2016   Procedure: Lower Extremity Angiography;  Surgeon: Algernon Huxley, MD;  Location: [place] [facility];  Service: Cardiovascular;  Laterality: Left;  . LOWER EXTREMITY ANGIOGRAPHY Left 12/20/2016   Procedure: Lower Extremity Angiography;  Surgeon: Algernon Huxley, MD;  Location: [place] [facility];  Service: Cardiovascular;  Laterality: Left;  . LOWER EXTREMITY ANGIOGRAPHY Right 12/18/2018   Procedure: LOWER EXTREMITY ANGIOGRAPHY;  Surgeon: Algernon Huxley, MD;  Location: [place] [facility];  Service: Cardiovascular;  Laterality: Right;  . LOWER EXTREMITY INTERVENTION  09/01/2016   Procedure: Lower Extremity Intervention;  Surgeon: Algernon Huxley, MD;  Location: [place] [facility];  Service: Cardiovascular;;  . PROSTATE SURGERY     removal  . TONSILLECTOMY     as a child    Social History        Tobacco Use  . Smoking status: Former Smoker    Packs/day: 0.25    Types: Cigarettes    Quit date: 10/14/1994    Years since quitting: 24.7  . Smokeless tobacco: Never Used  Substance Use Topics  . Alcohol use: No    Alcohol/week: 0.0 standard drinks  . Drug use: No         Family History  Problem Relation Age of Onset  . Hypertension Mother   . Diabetes Mother   . Prostate cancer Neg Hx   . Bladder Cancer Neg Hx   . Kidney disease Neg Hx           Allergies  Allergen Reactions  . Contrast Media [Iodinated Diagnostic Agents] Shortness Of Breath    SOB  . Iohexol Shortness Of Breath     Desc: Respiratory Distress, Laryngedema, diaphoresis, Onset Date: [id number]   . Metrizamide Shortness Of Breath    SOB SOB SOB   . Latex Rash     REVIEW OF SYSTEMS (Negative unless checked) Constitutional: [] ???Weight loss[] ???Fever[] ???Chills Cardiac:[] ???Chest pain[] ???Atrial  Fibrillation[] ???Palpitations [] ???Shortness of breath when laying flat [] ???Shortness of breath with exertion. [] ???Shortness of breath at rest Vascular: [] ???Pain in legs with walking[] ???Pain in legswith standing[] ???Pain in legs when laying flat [] ???Claudication [] ???Pain in feet when laying flat [] ???History of DVT [] ???Phlebitis [] ???Swelling in legs [] ???Varicose  veins [] ???Non-healing ulcers Pulmonary: [] ???Uses home oxygen [] ???Productive cough[] ???Hemoptysis [] ???Wheeze [] ???COPD [] ???Asthma Neurologic: [] ???Dizziness[] ???Seizures [] ???Blackouts[] ???History of stroke [] ???History of TIA[] ???Aphasia [] ???Temporary Blindness[] ???Weaknessor numbness in arm [] ???Weakness or numbnessin leg Musculoskeletal:[] ???Joint swelling [] ???Joint pain [] ???Low back pain [] ???History of Knee Replacement [x] ???Arthritis [] ???back Surgeries[] ???Spinal Stenosis  Hematologic:[] ???Easy bruising[] ???Easy bleeding [] ???Hypercoagulable state [x] ???Anemic Gastrointestinal:[] ???Diarrhea [] ???Vomiting[] ???Gastroesophageal reflux/heartburn[] ???Difficulty swallowing. [] ???Abdominal pain Genitourinary: [x] ???Chronic kidney disease [] ???Difficulturination [] ???Anuric[] ???Blood in urine [] ???Frequenturination [] ???Burning with urination[] ???Hematuria Skin: [] ???Rashes [] ???Ulcers [] ???Wounds Psychological: [] ???History of anxiety[] ???History of major depression [x] ???Memory Difficulties    Physical Examination  BP (!) 162/80   Pulse (!) 51   Resp 14   Ht 5\' 7"  (1.702 m)   Wt 180 lb (81.6 kg)   BMI 28.19 kg/m  Gen:  WD/WN, NAD Head: [place]/AT, No temporalis wasting. Ear/Nose/Throat: Hearing grossly intact, nares w/o erythema or drainage Eyes: Conjunctiva clear. Sclera non-icteric Neck: Supple.  Trachea midline Pulmonary:  Good air movement, no use of accessory muscles.  Cardiac: RRR, no JVD Vascular:    Vessel Right Left  Radial Palpable Palpable               Musculoskeletal: M/S 5/5 throughout.  Bilateral BKA Neurologic: Sensation grossly intact in extremities.  Symmetrical.  Speech is fluent.  Psychiatric: Judgment intact, Mood & affect appropriate for pt's clinical situation. Dermatologic: Right BKA wound as described above markedly improved.      Labs Recent Results (from the past 2160 hour(s))  Basic metabolic panel     Status: Abnormal   Collection Time: 06/24/19  2:05 AM  Result Value Ref Range   Sodium 134 (L) 135 - 145 mmol/L   Potassium 4.1 3.5 - 5.1 mmol/L   Chloride 99 98 - 111 mmol/L   CO2 26 22 - 32 mmol/L   Glucose, Bld 526 (HH) 70 - 99 mg/dL    Comment: CRITICAL RESULT CALLED TO, READ BACK BY AND VERIFIED WITH APRIL BRUMGARD ON 06/24/19 AT 0256 [facility]    BUN 30 (H) 8 - 23 mg/dL   Creatinine, Ser 1.40 (H) 0.61 - 1.24 mg/dL   Calcium 8.8 (L) 8.9 - 10.3 mg/dL   GFR calc non Af Amer 46 (L) >60 mL/min   GFR calc Af Amer 54 (L) >60 mL/min   Anion gap 9 5 - 15    Comment: Performed at [facility], [address] [place], [place] [address]  CBC with Differential     Status: Abnormal   Collection Time: 06/24/19  2:05 AM  Result Value Ref Range   WBC 10.7 (H) 4.0 - 10.5 K/uL   RBC 4.99 4.22 - 5.81 MIL/uL   Hemoglobin 14.2 13.0 - 17.0 g/dL   HCT 42.6 39.0 - 52.0 %   MCV 85.4 80.0 - 100.0 fL   MCH 28.5 26.0 - 34.0 pg   MCHC 33.3 30.0 - 36.0 g/dL   RDW 12.8 11.5 - 15.5 %   Platelets 265 150 - 400 K/uL   nRBC 0.0 0.0 - 0.2 %   Neutrophils Relative % 73 %   Neutro Abs 7.9 (H) 1.7 - 7.7 K/uL   Lymphocytes Relative 8 %   Lymphs Abs 0.9 0.7 - 4.0 K/uL   Monocytes Relative 7 %   Monocytes Absolute 0.7 0.1 - 1.0 K/uL   Eosinophils Relative 10 %   Eosinophils Absolute 1.0 (H) 0.0 - 0.5 K/uL   Basophils Relative 1 %   Basophils Absolute 0.1 0.0 - 0.1 K/uL   Immature Granulocytes 1 %   Abs Immature Granulocytes 0.05 0.00 - 0.07 K/uL  Comment:  Performed at [facility], [place], [place] [address]  Troponin I (High Sensitivity)     Status: Abnormal   Collection Time: 06/24/19  2:05 AM  Result Value Ref Range   Troponin I (High Sensitivity) 34 (H) <18 ng/L    Comment: (NOTE) Elevated high sensitivity troponin I (hsTnI) values and significant  changes across serial measurements may suggest ACS but many other  chronic and acute conditions are known to elevate hsTnI results.  Refer to the "Links" section for chest pain algorithms and additional  guidance. Performed at [facility], [address] [place], [place] [address]   APTT     Status: None   Collection Time: 06/24/19  2:05 AM  Result Value Ref Range   aPTT 30 24 - 36 seconds    Comment: Performed at [facility], [address] [place], [place] [address]  Protime-INR     Status: None   Collection Time: 06/24/19  2:05 AM  Result Value Ref Range   Prothrombin Time 14.5 11.4 - 15.2 seconds   INR 1.1 0.8 - 1.2    Comment: (NOTE) INR goal varies based on device and disease states. Performed at [facility], [address], [place], [place] [address]   Respiratory Panel by RT PCR (Flu A&B, Covid) - Nasopharyngeal Swab     Status: None   Collection Time: 06/24/19  2:06 AM   Specimen: Nasopharyngeal Swab  Result Value Ref Range   SARS Coronavirus 2 by RT PCR NEGATIVE NEGATIVE    Comment: (NOTE) SARS-CoV-2 target nucleic acids are NOT DETECTED. The SARS-CoV-2 RNA is generally detectable in upper respiratoy specimens during the acute phase of infection. The lowest concentration of SARS-CoV-2 viral copies this assay can detect is 131 copies/mL. A negative result does not preclude SARS-Cov-2 infection and should not be used as the sole basis for treatment or other patient management decisions. A negative result may occur with  improper specimen collection/handling, submission of specimen other than nasopharyngeal  swab, presence of viral mutation(s) within the areas targeted by this assay, and inadequate number of viral copies (<131 copies/mL). A negative result must be combined with clinical observations, patient history, and epidemiological information. The expected result is Negative. Fact Sheet for Patients:  PinkCheek.be Fact Sheet for Healthcare Providers:  GravelBags.it This test is not yet ap proved or cleared by the [place] FDA and  has been authorized for detection and/or diagnosis of SARS-CoV-2 by FDA under an Emergency Use Authorization (EUA). This EUA will remain  in effect (meaning this test can be used) for the duration of the COVID-19 declaration under Section 564(b)(1) of the Act, 21 U.S.C. section 360bbb-3(b)(1), unless the authorization is terminated or revoked sooner.    Influenza A by PCR NEGATIVE NEGATIVE   Influenza B by PCR NEGATIVE NEGATIVE    Comment: (NOTE) The Xpert Xpress SARS-CoV-2/FLU/RSV assay is intended as an aid in  the diagnosis of influenza from Nasopharyngeal swab specimens and  should not be used as a sole basis for treatment. Nasal washings and  aspirates are unacceptable for Xpert Xpress SARS-CoV-2/FLU/RSV  testing. Fact Sheet for Patients: PinkCheek.be Fact Sheet for Healthcare Providers: GravelBags.it This test is not yet approved or cleared by the [place] FDA and  has been authorized for detection and/or diagnosis of SARS-CoV-2 by  FDA under an Emergency Use Authorization (EUA). This EUA will remain  in effect (meaning this test can be used) for the duration of  the  Covid-19 declaration under Section 564(b)(1) of the Act, 21  U.S.C. section 360bbb-3(b)(1), unless the authorization is  terminated or revoked. Performed at [facility], [place], [place] [address]   Troponin I (High Sensitivity)      Status: Abnormal   Collection Time: 06/24/19  3:59 AM  Result Value Ref Range   Troponin I (High Sensitivity) 62 (H) <18 ng/L    Comment: READ BACK AND VERIFIED WITH APRIL BRUMGARD ON 06/24/19 AT 0450 [place] (NOTE) Elevated high sensitivity troponin I (hsTnI) values and significant  changes across serial measurements may suggest ACS but many other  chronic and acute conditions are known to elevate hsTnI results.  Refer to the "Links" section for chest pain algorithms and additional  guidance. Performed at [facility], [address] [place], [place] [address]   Glucose, capillary     Status: Abnormal   Collection Time: 06/24/19  4:34 AM  Result Value Ref Range   Glucose-Capillary 331 (H) 70 - 99 mg/dL  Heparin level (unfractionated)     Status: Abnormal   Collection Time: 06/24/19  5:59 AM  Result Value Ref Range   Heparin Unfractionated <0.10 (L) 0.30 - 0.70 IU/mL    Comment: (NOTE) If heparin results are below expected values, and patient dosage has  been confirmed, suggest follow up testing of antithrombin III levels. Performed at [facility], [name] [place], [place] [address]   Glucose, capillary     Status: Abnormal   Collection Time: 06/24/19  8:04 AM  Result Value Ref Range   Glucose-Capillary 263 (H) 70 - 99 mg/dL  Glucose, capillary     Status: Abnormal   Collection Time: 06/24/19 10:45 AM  Result Value Ref Range   Glucose-Capillary 297 (H) 70 - 99 mg/dL  Hemoglobin A1c     Status: Abnormal   Collection Time: 06/24/19 10:47 AM  Result Value Ref Range   Hgb A1c MFr Bld 9.4 (H) 4.8 - 5.6 %    Comment: (NOTE) Pre diabetes:          5.7%-6.4% Diabetes:              >6.4% Glycemic control for   <7.0% adults with diabetes    Mean Plasma Glucose 223.08 mg/dL    Comment: Performed at [facility] [address] [place], [place] [address]  Troponin I (High Sensitivity)     Status: Abnormal   Collection Time: 06/24/19 10:47 AM    Result Value Ref Range   Troponin I (High Sensitivity) 116 (HH) <18 ng/L    Comment: CRITICAL RESULT CALLED TO, READ BACK BY AND VERIFIED WITH TAMMY TODD ON 06/24/19 AT 1143 BY JAG (NOTE) Elevated high sensitivity troponin I (hsTnI) values and significant  changes across serial measurements may suggest ACS but many other  chronic and acute conditions are known to elevate hsTnI results.  Refer to the "Links" section for chest pain algorithms and additional  guidance. Performed at [facility], [address] [place], [place] [address]   Glucose, capillary     Status: Abnormal   Collection Time: 06/24/19 11:49 AM  Result Value Ref Range   Glucose-Capillary 299 (H) 70 - 99 mg/dL  Brain natriuretic peptide     Status: Abnormal   Collection Time: 06/24/19  1:46 PM  Result Value Ref Range   B Natriuretic Peptide 887.0 (H) 0.0 - 100.0 pg/mL    Comment: Performed at [facility], [address] [place], [place] [address]  Glucose, capillary     Status: Abnormal   Collection Time: 06/24/19  3:11 PM  Result Value Ref Range   Glucose-Capillary 206 (H) 70 - 99 mg/dL  Heparin level (unfractionated)     Status: Abnormal   Collection Time: 06/24/19  3:52 PM  Result Value Ref Range   Heparin Unfractionated 0.17 (L) 0.30 - 0.70 IU/mL    Comment: (NOTE) If heparin results are below expected values, and patient dosage has  been confirmed, suggest follow up testing of antithrombin III levels. Performed at [facility], [address] [place], [place] [address]   Glucose, capillary     Status: Abnormal   Collection Time: 06/24/19  4:57 PM  Result Value Ref Range   Glucose-Capillary 108 (H) 70 - 99 mg/dL  Glucose, capillary     Status: Abnormal   Collection Time: 06/24/19  8:33 PM  Result Value Ref Range   Glucose-Capillary 193 (H) 70 - 99 mg/dL  Troponin I (High Sensitivity)     Status: Abnormal   Collection Time: 06/25/19  1:19 AM  Result Value Ref Range    Troponin I (High Sensitivity) 73 (H) <18 ng/L    Comment: (NOTE) Elevated high sensitivity troponin I (hsTnI) values and significant  changes across serial measurements may suggest ACS but many other  chronic and acute conditions are known to elevate hsTnI results.  Refer to the "Links" section for chest pain algorithms and additional  guidance. Performed at [facility], [address] [place], [place] [id number]   Basic metabolic panel     Status: Abnormal   Collection Time: 06/25/19  1:19 AM  Result Value Ref Range   Sodium 139 135 - 145 mmol/L   Potassium 3.8 3.5 - 5.1 mmol/L   Chloride 101 98 - 111 mmol/L   CO2 30 22 - 32 mmol/L   Glucose, Bld 174 (H) 70 - 99 mg/dL   BUN 23 8 - 23 mg/dL   Creatinine, Ser 1.24 0.61 - 1.24 mg/dL   Calcium 8.7 (L) 8.9 - 10.3 mg/dL   GFR calc non Af Amer 54 (L) >60 mL/min   GFR calc Af Amer >60 >60 mL/min   Anion gap 8 5 - 15    Comment: Performed at [facility], [address] [place], [place] [address]  Lipid panel     Status: Abnormal   Collection Time: 06/25/19  1:19 AM  Result Value Ref Range   Cholesterol 121 0 - 200 mg/dL   Triglycerides 57 <150 mg/dL   HDL 34 (L) >40 mg/dL   Total CHOL/HDL Ratio 3.6 RATIO   VLDL 11 0 - 40 mg/dL   LDL Cholesterol 76 0 - 99 mg/dL    Comment:        Total Cholesterol/HDL:CHD Risk Coronary Heart Disease Risk Table                     Men   Women  1/2 Average Risk   3.4   3.3  Average Risk       5.0   4.4  2 X Average Risk   9.6   7.1  3 X Average Risk  23.4   11.0        Use the calculated Patient Ratio above and the CHD Risk Table to determine the patient's CHD Risk.        ATP III CLASSIFICATION (LDL):  <100  mg/dL   Optimal  100-129  mg/dL   Near or Above                    Optimal  130-159  mg/dL   Borderline  160-189  mg/dL   High  >190     mg/dL   Very High Performed at [facility], [address] [place], [place] [address]   CBC     Status:  Abnormal   Collection Time: 06/25/19  1:19 AM  Result Value Ref Range   WBC 13.2 (H) 4.0 - 10.5 K/uL   RBC 4.50 4.22 - 5.81 MIL/uL   Hemoglobin 13.0 13.0 - 17.0 g/dL   HCT 38.4 (L) 39.0 - 52.0 %   MCV 85.3 80.0 - 100.0 fL   MCH 28.9 26.0 - 34.0 pg   MCHC 33.9 30.0 - 36.0 g/dL   RDW 13.0 11.5 - 15.5 %   Platelets 268 150 - 400 K/uL   nRBC 0.0 0.0 - 0.2 %    Comment: Performed at [facility], [place], [place] [address]  Heparin level (unfractionated)     Status: None   Collection Time: 06/25/19  1:19 AM  Result Value Ref Range   Heparin Unfractionated 0.53 0.30 - 0.70 IU/mL    Comment: (NOTE) If heparin results are below expected values, and patient dosage has  been confirmed, suggest follow up testing of antithrombin III levels. Performed at [facility], [address] [place], [place] [address]   Glucose, capillary     Status: Abnormal   Collection Time: 06/25/19  8:12 AM  Result Value Ref Range   Glucose-Capillary 114 (H) 70 - 99 mg/dL  Glucose, capillary     Status: Abnormal   Collection Time: 06/25/19 11:57 AM  Result Value Ref Range   Glucose-Capillary 155 (H) 70 - 99 mg/dL  ECHOCARDIOGRAM COMPLETE     Status: None   Collection Time: 06/25/19  1:42 PM  Result Value Ref Range   Weight 2,740.76 oz   Height 67 in   BP 128/55 mmHg  Glucose, capillary     Status: Abnormal   Collection Time: 06/25/19  4:17 PM  Result Value Ref Range   Glucose-Capillary 193 (H) 70 - 99 mg/dL  Glucose, capillary     Status: Abnormal   Collection Time: 06/25/19  9:21 PM  Result Value Ref Range   Glucose-Capillary 338 (H) 70 - 99 mg/dL  Glucose, capillary     Status: Abnormal   Collection Time: 06/26/19  7:36 AM  Result Value Ref Range   Glucose-Capillary 248 (H) 70 - 99 mg/dL  Glucose, capillary     Status: Abnormal   Collection Time: 06/26/19 11:29 AM  Result Value Ref Range   Glucose-Capillary 202 (H) 70 - 99 mg/dL    Radiology No results  found.  Assessment/Plan Essential hypertension blood pressure control important in reducing the progression of atherosclerotic disease. On appropriate oral medications.   Type 2 diabetes mellitus treated with insulin (HCC) blood glucose control important in reducing the progression of atherosclerotic disease. Also, involved in wound healing. On appropriate medications.  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  I am hopeful that within a month, this will be completely epithelialized and we can refer him for prosthesis evaluation.  We will see him back in a month.    Leotis Pain, MD  08/17/2019 4:14 PM    This note was  created with Dragon medical transcription system.  Any errors from dictation are purely unintentional

## 2019-08-17 NOTE — Assessment & Plan Note (Signed)
His right BKA stump is actually healing quite well.  I am hopeful that within a month, this will be completely epithelialized and we can refer him for prosthesis evaluation.  We will see him back in a month.

## 2019-08-29 IMAGING — CR DG HIP (WITH OR WITHOUT PELVIS) 2-3V RIGHT
3 series · 3 of 3 positions shown · non-contrast
Comparison: None.

CLINICAL DATA: Right leg pain after fall.

EXAM:
DG HIP (WITH OR WITHOUT PELVIS) 2-3V RIGHT

[pelvis ap]
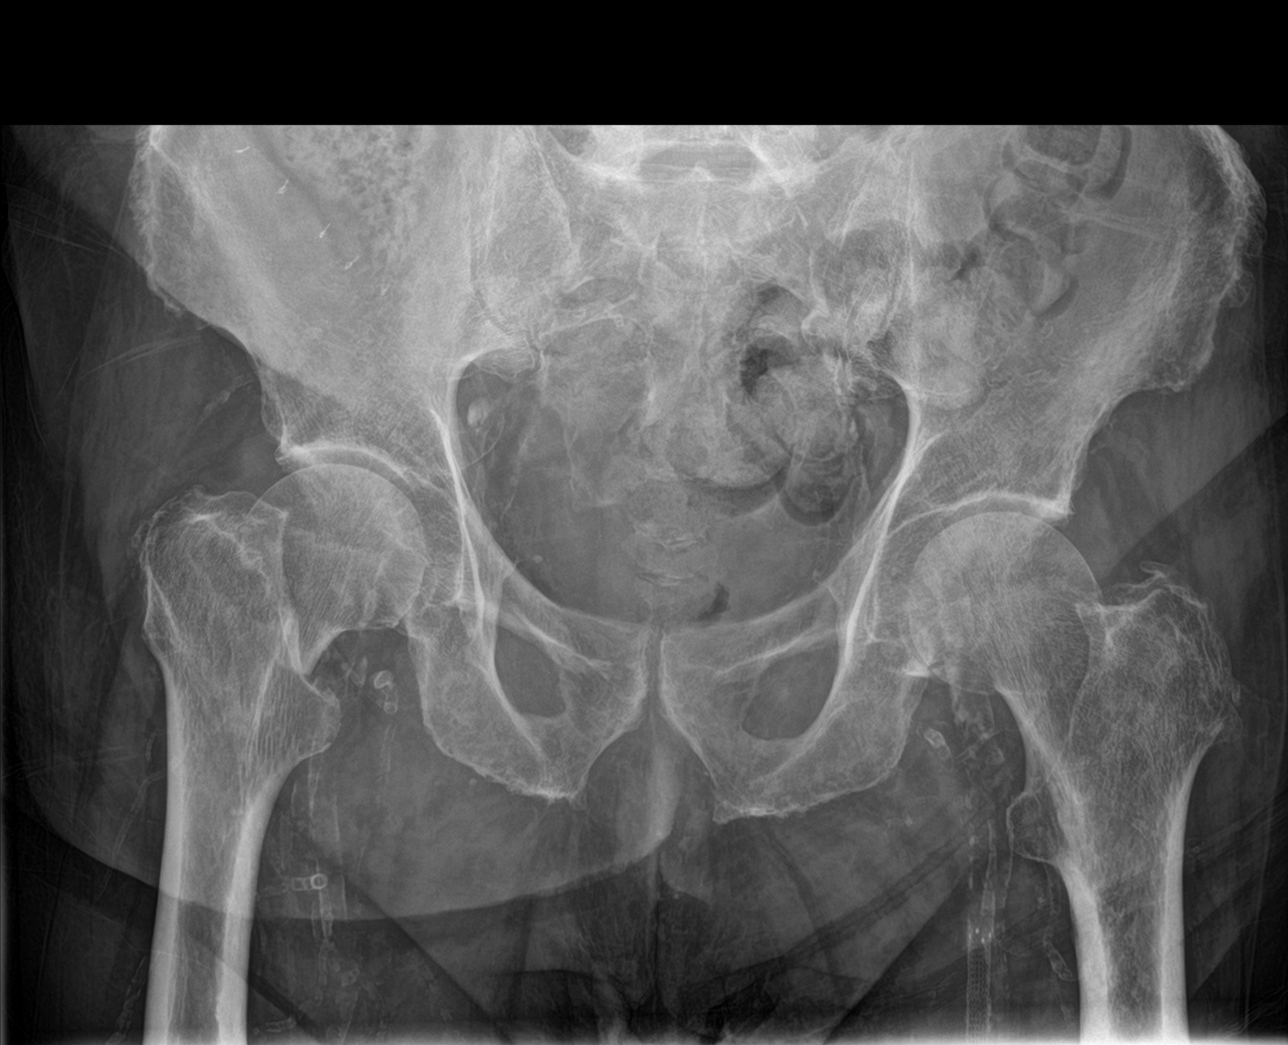

[hip ap]
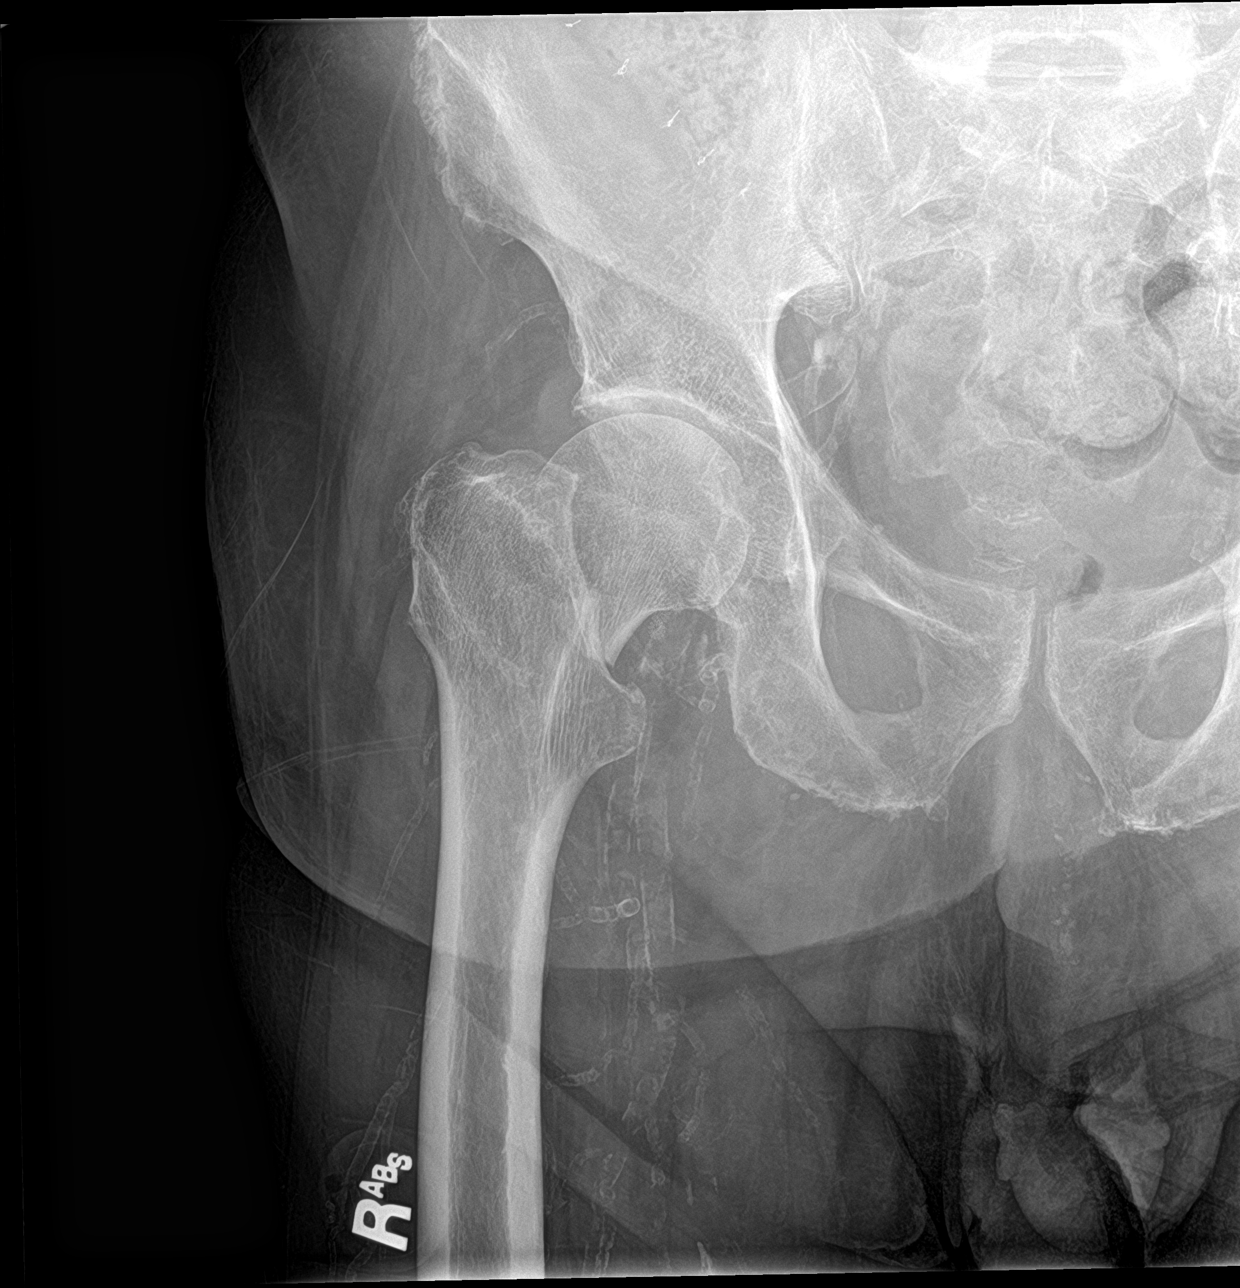

[hip lat]
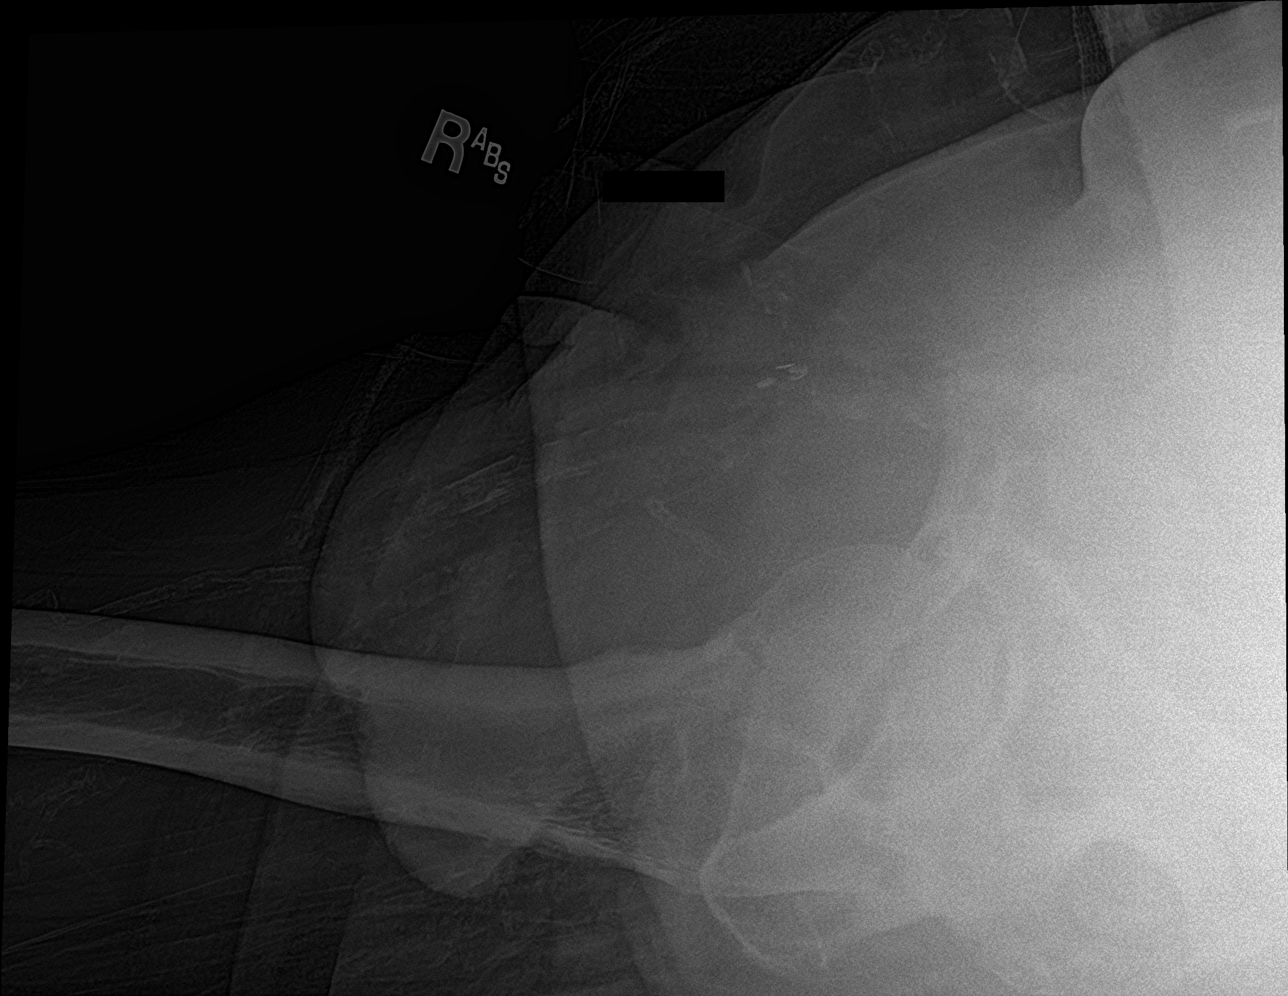

[3 of 3 positions shown; findings below may reference images not displayed]

FINDINGS: Mildly displaced proximal right femoral neck fracture is noted. No
dislocation is noted. No significant degenerative changes noted.
Vascular calcifications are noted.
IMPRESSION: Mildly displaced proximal right femoral neck fracture.

## 2019-09-16 ENCOUNTER — Other Ambulatory Visit: Payer: Self-pay

## 2019-09-16 ENCOUNTER — Emergency Department: Payer: Medicare Other

## 2019-09-16 ENCOUNTER — Encounter: Payer: Self-pay | Admitting: *Deleted

## 2019-09-16 ENCOUNTER — Emergency Department
Admission: EM | Admit: 2019-09-16 | Discharge: 2019-09-16 | Disposition: A | Discharge status: Home / Self Care | Payer: Medicare Other | Attending: Emergency Medicine | Admitting: Emergency Medicine

## 2019-09-16 DIAGNOSIS — Z8616 Personal history of COVID-19: Secondary | ICD-10-CM | POA: Insufficient documentation

## 2019-09-16 DIAGNOSIS — R4182 Altered mental status, unspecified: Secondary | ICD-10-CM | POA: Diagnosis not present

## 2019-09-16 DIAGNOSIS — I130 Hypertensive heart and chronic kidney disease with heart failure and stage 1 through stage 4 chronic kidney disease, or unspecified chronic kidney disease: Secondary | ICD-10-CM | POA: Insufficient documentation

## 2019-09-16 DIAGNOSIS — I5042 Chronic combined systolic (congestive) and diastolic (congestive) heart failure: Secondary | ICD-10-CM | POA: Insufficient documentation

## 2019-09-16 DIAGNOSIS — Z794 Long term (current) use of insulin: Secondary | ICD-10-CM | POA: Diagnosis not present

## 2019-09-16 DIAGNOSIS — Z7901 Long term (current) use of anticoagulants: Secondary | ICD-10-CM | POA: Diagnosis not present

## 2019-09-16 DIAGNOSIS — Z79899 Other long term (current) drug therapy: Secondary | ICD-10-CM | POA: Insufficient documentation

## 2019-09-16 DIAGNOSIS — N189 Chronic kidney disease, unspecified: Secondary | ICD-10-CM | POA: Insufficient documentation

## 2019-09-16 DIAGNOSIS — Z89512 Acquired absence of left leg below knee: Secondary | ICD-10-CM | POA: Diagnosis not present

## 2019-09-16 DIAGNOSIS — Z8546 Personal history of malignant neoplasm of prostate: Secondary | ICD-10-CM | POA: Diagnosis not present

## 2019-09-16 DIAGNOSIS — Z87891 Personal history of nicotine dependence: Secondary | ICD-10-CM | POA: Insufficient documentation

## 2019-09-16 DIAGNOSIS — Z89511 Acquired absence of right leg below knee: Secondary | ICD-10-CM | POA: Insufficient documentation

## 2019-09-16 DIAGNOSIS — Z7982 Long term (current) use of aspirin: Secondary | ICD-10-CM | POA: Insufficient documentation

## 2019-09-16 DIAGNOSIS — E1122 Type 2 diabetes mellitus with diabetic chronic kidney disease: Secondary | ICD-10-CM | POA: Insufficient documentation

## 2019-09-16 DIAGNOSIS — Z96641 Presence of right artificial hip joint: Secondary | ICD-10-CM | POA: Insufficient documentation

## 2019-09-16 DIAGNOSIS — Z91040 Latex allergy status: Secondary | ICD-10-CM | POA: Insufficient documentation

## 2019-09-16 LAB — URINALYSIS, COMPLETE (UACMP) WITH MICROSCOPIC
Bilirubin Urine: NEGATIVE
Glucose, UA: 250 mg/dL — AB
Ketones, ur: NEGATIVE mg/dL
Leukocytes,Ua: NEGATIVE
Nitrite: NEGATIVE
Protein, ur: 100 mg/dL — AB
Specific Gravity, Urine: 1.025 (ref 1.005–1.030)
Squamous Epithelial / HPF: NONE SEEN (ref 0–5)
pH: 5.5 (ref 5.0–8.0)

## 2019-09-16 LAB — COMPREHENSIVE METABOLIC PANEL WITH GFR
ALT: 34 U/L (ref 0–44)
AST: 27 U/L (ref 15–41)
Albumin: 3.9 g/dL (ref 3.5–5.0)
Alkaline Phosphatase: 132 U/L — ABNORMAL HIGH (ref 38–126)
Anion gap: 9 (ref 5–15)
BUN: 32 mg/dL — ABNORMAL HIGH (ref 8–23)
CO2: 27 mmol/L (ref 22–32)
Calcium: 9.2 mg/dL (ref 8.9–10.3)
Chloride: 104 mmol/L (ref 98–111)
Creatinine, Ser: 1.21 mg/dL (ref 0.61–1.24)
GFR calc Af Amer: >60 mL/min (ref >60)
GFR calc non Af Amer: 55 mL/min — ABNORMAL LOW (ref >60)
Glucose, Bld: 265 mg/dL — ABNORMAL HIGH (ref 70–99)
Potassium: 4.1 mmol/L (ref 3.5–5.1)
Sodium: 140 mmol/L (ref 135–145)
Total Bilirubin: 0.9 mg/dL (ref 0.3–1.2)
Total Protein: 7.6 g/dL (ref 6.5–8.1)

## 2019-09-16 LAB — CBC
HCT: 43.1 % (ref 39.0–52.0)
Hemoglobin: 14.7 g/dL (ref 13.0–17.0)
MCH: 29.3 pg (ref 26.0–34.0)
MCHC: 34.1 g/dL (ref 30.0–36.0)
MCV: 86 fL (ref 80.0–100.0)
Platelets: 234 10*3/uL (ref 150–400)
RBC: 5.01 MIL/uL (ref 4.22–5.81)
RDW: 14.5 % (ref 11.5–15.5)
WBC: 11 10*3/uL — ABNORMAL HIGH (ref 4.0–10.5)
nRBC: 0 % (ref 0.0–0.2)

## 2019-09-16 LAB — BRAIN NATRIURETIC PEPTIDE: B Natriuretic Peptide: 734 pg/mL — ABNORMAL HIGH (ref 0.0–100.0)

## 2019-09-16 LAB — TROPONIN I (HIGH SENSITIVITY): Troponin I (High Sensitivity): 44 ng/L — ABNORMAL HIGH (ref <18)

## 2019-09-16 LAB — LACTIC ACID, PLASMA: Lactic Acid, Venous: 1.4 mmol/L (ref 0.5–1.9)

## 2019-09-16 MED ORDER — CLONIDINE HCL 0.1 MG PO TABS
0.2000 mg | ORAL_TABLET | Freq: Once | ORAL | Status: AC
  Administered 2019-09-16: 0.2 mg via ORAL
  Filled 2019-09-16: qty 2

## 2019-09-16 MED ORDER — SODIUM CHLORIDE 0.9% FLUSH: 3.0000 mL | Freq: Once | INTRAVENOUS | Status: DC

## 2019-09-16 MED ORDER — ISOSORBIDE MONONITRATE ER 60 MG PO TB24
60.0000 mg | ORAL_TABLET | Freq: Every day | ORAL | Status: DC
  Administered 2019-09-16: 09:00:00 60 mg via ORAL
  Filled 2019-09-16: qty 1

## 2019-09-16 NOTE — ED Triage Notes (Signed)
Pt arrives via [facility] from home (lives with his wife). Per report by EMS, the patient's wife called out for altered mental status, slurred speech and odorous urine. En route, CBG 272, 166/78, Hr 50's, 95% RA.

## 2019-09-16 NOTE — ED Provider Notes (Signed)
[facility] Emergency Department Provider Note ____________________________________________   First MD Initiated Contact with Patient 09/16/19 [phone]     (approximate)  I have reviewed the triage vital signs and the nursing notes.   HISTORY  Chief Complaint Altered Mental Status  Level 5 caveat: History of present illness limited due to altered mental status  HPI [name] is a 85 y.o. male with PMH as noted below who presents via EMS for altered mental status.  The patient's wife called EMS because the patient was confused, had slurred speech, and malodorous urine.  The patient states "they told me to come to the hospital, so here I am."  He denies any acute complaints at this time and states he does not feel confused or foggy.  He denies any acute pain.  Past Medical History:  Diagnosis Date  . Arthritis   . Atrial fibrillation ([place])   . Carcinoma of prostate ([place])   . CHF (congestive heart failure) ([place])   . Chronic kidney disease   . Diabetes mellitus without complication ([place])   . ED (erectile dysfunction)   . Frequent urination   . Hyperlipidemia   . Hypertension   . Moderate mitral insufficiency   . Peripheral vascular disease ([place])   . Pneumonia 04/2016  . Prostate cancer ([place])   . Sleep apnea    OSA--USE C-PAP  . Ulcer of left foot due to type 2 diabetes mellitus ([place])   . Urinary stress incontinence, male     Patient Active Problem List   Diagnosis Date Noted  . ACS (acute coronary syndrome) ([place]) 06/24/2019  . Bradycardia 05/16/2019  . Paroxysmal A-fib ([place]) 03/27/2019  . Hx of BKA, right ([place]) 03/02/2019  . Severe protein-calorie malnutrition ([place]) 02/16/2019  . Acute systolic CHF (congestive heart failure) ([place]) 02/16/2019  . Acute respiratory failure with hypoxia ([place]) 02/08/2019  . Pneumonia due to COVID-19 virus 02/08/2019  . Below-knee amputation of right lower extremity ([place]) 01/15/2019  . Polyp of descending  colon   . Iron deficiency anemia due to chronic blood loss   . Sacral decubitus ulcer, stage II ([place]) 12/26/2018  . Acute respiratory failure ([place]) 12/20/2018  . Femur fracture ([place]) 10/13/2018  . Chest pain 06/03/2018  . NSTEMI (non-ST elevated myocardial infarction) ([place]) 01/16/2018  . Acute CHF (congestive heart failure) ([place]) 11/23/2017  . Atrial fibrillation with rapid ventricular response ([place]) 10/15/2017  . BKA stump complication ([place]) 0000000  . 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 ([place]) 02/10/2017  . S/P BKA (below knee amputation) unilateral, left ([place]) 02/01/2017  . Chronic diastolic heart failure ([place]) 01/21/2017  . Pressure injury of skin 01/18/2017  . Atherosclerosis of artery of extremity with gangrene ([place]) 01/05/2017  . Diabetic foot ulcer ([place]) 12/29/2016  . Ataxia 10/21/2016  . Atherosclerosis of native arteries of the extremities with ulceration ([place]) 10/12/2016  . History of femoral angiogram 09/06/2016  . Left leg pain 09/06/2016  . Leukocytosis 09/06/2016  . Generalized weakness 09/06/2016  . Atrial fibrillation, chronic ([place]) 08/30/2016  . Pneumonia 05/19/2016  . Hyperlipidemia 04/20/2016  . Back pain 04/19/2016  . Type 2 diabetes mellitus treated with insulin ([place]) 04/19/2016  . Essential hypertension 04/19/2016  . PAD (peripheral artery disease) ([place]) 04/19/2016  . Erectile dysfunction following radical prostatectomy 06/25/2015  . History of prostate cancer 12/23/2014  . Incontinence 12/23/2014    Past Surgical History:  Procedure Laterality Date  . AMPUTATION Left 01/12/2017   Procedure: AMPUTATION BELOW KNEE;  Surgeon: Algernon Huxley, MD;  Location: [facility] ORS;  Service: General;  Laterality: Left;  . AMPUTATION Right 12/27/2018   Procedure: AMPUTATION BELOW KNEE;  Surgeon: Algernon Huxley, MD;  Location: [facility] ORS;  Service: General;  Laterality: Right;  . APLIGRAFT PLACEMENT Left 10/15/2016   Procedure: APLIGRAFT PLACEMENT;  Surgeon:  Albertine Patricia, DPM;  Location: [facility] ORS;  Service: Podiatry;  Laterality: Left;  necrotic ulcer  . CHOLECYSTECTOMY    . COLONOSCOPY WITH PROPOFOL N/A 01/02/2019   Procedure: COLONOSCOPY WITH PROPOFOL;  Surgeon: Lucilla Lame, MD;  Location: [facility] ENDOSCOPY;  Service: Endoscopy;  Laterality: N/A;  . COLONOSCOPY WITH PROPOFOL N/A 01/05/2019   Procedure: COLONOSCOPY WITH PROPOFOL;  Surgeon: Lucilla Lame, MD;  Location: [facility] ENDOSCOPY;  Service: Endoscopy;  Laterality: N/A;  . ESOPHAGOGASTRODUODENOSCOPY (EGD) WITH PROPOFOL N/A 01/02/2019   Procedure: ESOPHAGOGASTRODUODENOSCOPY (EGD) WITH PROPOFOL;  Surgeon: Lucilla Lame, MD;  Location: [facility] ENDOSCOPY;  Service: Endoscopy;  Laterality: N/A;  . EYE SURGERY Bilateral    Cataract Extraction with IOL  . HIP ARTHROPLASTY Right 10/13/2018   Procedure: ARTHROPLASTY BIPOLAR HIP (HEMIARTHROPLASTY);  Surgeon: Earnestine Leys, MD;  Location: [facility] ORS;  Service: Orthopedics;  Laterality: Right;  . IRRIGATION AND DEBRIDEMENT FOOT Left 10/15/2016   Procedure: IRRIGATION AND DEBRIDEMENT FOOT-EXCISIONAL DEBRIDEMENT OF SKIN 3RD, 4TH AND 5TH TOES WITH APPLICATION OF APLIGRAFT;  Surgeon: Albertine Patricia, DPM;  Location: [facility] ORS;  Service: Podiatry;  Laterality: Left;  necrotic,gangrene  . IRRIGATION AND DEBRIDEMENT FOOT Left 12/09/2016   Procedure: IRRIGATION AND DEBRIDEMENT FOOT-MUSCLE/FASCIA, RESECTION OF FOURTH AND FIFTH METATARSAL NECROTIC BONE;  Surgeon: Albertine Patricia, DPM;  Location: [facility] ORS;  Service: Podiatry;  Laterality: Left;  . IRRIGATION AND DEBRIDEMENT FOOT Right 12/23/2018   Procedure: IRRIGATION AND DEBRIDEMENT FOOT and wound vac application;  Surgeon: Samara Deist, DPM;  Location: [facility] ORS;  Service: Podiatry;  Laterality: Right;  . LEFT HEART CATH N/A 06/25/2019   Procedure: Left Heart Cath and Coronary Angiography;  Surgeon: Isaias Cowman, MD;  Location: [place] CV LAB;  Service: Cardiovascular;  Laterality: N/A;  . LEFT HEART CATH AND  CORONARY ANGIOGRAPHY N/A 06/05/2018   Procedure: LEFT HEART CATH AND CORONARY ANGIOGRAPHY;  Surgeon: Yolonda Kida, MD;  Location: [place] CV LAB;  Service: Cardiovascular;  Laterality: N/A;  . LOWER EXTREMITY ANGIOGRAPHY Left 08/16/2016   Procedure: Lower Extremity Angiography;  Surgeon: Algernon Huxley, MD;  Location: [place] [facility];  Service: Cardiovascular;  Laterality: Left;  . LOWER EXTREMITY ANGIOGRAPHY Left 09/01/2016   Procedure: Lower Extremity Angiography;  Surgeon: Algernon Huxley, MD;  Location: [place] CV LAB;  Service: Cardiovascular;  Laterality: Left;  . LOWER EXTREMITY ANGIOGRAPHY Left 10/14/2016   Procedure: Lower Extremity Angiography;  Surgeon: Algernon Huxley, MD;  Location: [place] [facility] LAB;  Service: Cardiovascular;  Laterality: Left;  . LOWER EXTREMITY ANGIOGRAPHY Left 12/20/2016   Procedure: Lower Extremity Angiography;  Surgeon: Algernon Huxley, MD;  Location: [place] [facility];  Service: Cardiovascular;  Laterality: Left;  . LOWER EXTREMITY ANGIOGRAPHY Right 12/18/2018   Procedure: LOWER EXTREMITY ANGIOGRAPHY;  Surgeon: Algernon Huxley, MD;  Location: [place] [facility];  Service: Cardiovascular;  Laterality: Right;  . LOWER EXTREMITY INTERVENTION  09/01/2016   Procedure: Lower Extremity Intervention;  Surgeon: Algernon Huxley, MD;  Location: [place] [facility] LAB;  Service: Cardiovascular;;  . PROSTATE SURGERY     removal  . TONSILLECTOMY     as a child    Prior to Admission medications  Medication Sig Start Date End Date Taking? Authorizing Provider  acetaminophen (TYLENOL) 325 MG tablet Take 650 mg by mouth every 6 (six) hours as needed.    [provider]  amiodarone (PACERONE) 200 MG tablet Take 200 mg by mouth daily. 09/13/18   [provider]  aspirin EC 81 MG tablet Take 1 tablet (81 mg total) by mouth daily. 12/18/18   Algernon Huxley, MD  atorvastatin (LIPITOR) 80 MG tablet Take 80 mg by mouth every evening. 07/31/18   [provider]  bisacodyl (DULCOLAX) 5 MG EC tablet Take 1 tablet (5 mg total) by mouth daily as needed for moderate constipation. Patient not taking: Reported on 06/24/2019 01/05/19   [name], MD  chlorthalidone (HYGROTON) 25 MG tablet Take 25 mg by mouth daily.    [provider]  Cholecalciferol (VITAMIN D-3) 25 MCG (1000 UT) CAPS Take by mouth.    [provider]  collagenase (SANTYL) ointment Apply 1 application topically daily. Patient not taking: Reported on 06/24/2019 04/16/19   Kris Hartmann, NP  diltiazem (CARDIZEM CD) 120 MG 24 hr capsule Take 120 mg by mouth daily. 09/28/18   [provider]  divalproex (DEPAKOTE ER) 500 MG 24 hr tablet Take 1 tablet (500 mg total) by mouth daily. Patient not taking: Reported on 06/24/2019 02/16/19   Charlynne Cousins, MD  doxycycline (VIBRAMYCIN) 100 MG capsule Take 100 mg by mouth 2 (two) times daily.    [provider]  Ensure Max Protein (ENSURE MAX PROTEIN) LIQD Take 330 mLs (11 oz total) by mouth 2 (two) times daily. 01/05/19   [name], MD  ferrous sulfate 325 (65 FE) MG tablet Take 1 tablet (325 mg total) by mouth daily with breakfast. 10/18/18   [name] Mandes, MD  gabapentin (NEURONTIN) 300 MG capsule Take 300 mg by mouth 3 (three) times daily.  02/07/19   [provider]  HYDROcodone-acetaminophen (NORCO/VICODIN) 5-325 MG tablet Take 1 tablet by mouth every 6 (six) hours as needed for moderate pain (pain score 4-6). Patient not taking: Reported on 06/24/2019 02/19/19   Charlynne Cousins, MD  insulin glargine (LANTUS) 100 UNIT/ML injection Inject 0.15 mLs (15 Units total) into the skin at bedtime. 10/18/18   [name] Mandes, MD  insulin lispro (HUMALOG) 100 UNIT/ML injection Inject 0.03 mLs (3 Units total) into the skin 3 (three) times daily with meals. Patient taking differently: Inject 3 Units into the skin 3 (three) times daily with meals. Plus 1 additional unit for every 50 BG points over 200 (max 5  additional units per dose) 11/28/17   Loletha Grayer, MD  isosorbide mononitrate (IMDUR) 60 MG 24 hr tablet Take 1 tablet (60 mg total) by mouth daily. 04/06/19   Arta Silence, MD  metFORMIN (GLUCOPHAGE) 500 MG tablet Take by mouth daily.    [provider]  metoprolol succinate (TOPROL-XL) 25 MG 24 hr tablet Take 25 mg by mouth daily.    [provider]  metoprolol tartrate (LOPRESSOR) 25 MG tablet Take 25 mg by mouth 3 (three) times daily. 04/08/19   [provider]  Multiple Vitamin (MULTIVITAMIN WITH MINERALS) TABS tablet Take 1 tablet by mouth daily. 01/05/19   [name], MD  nitroGLYCERIN (NITROSTAT) 0.4 MG SL tablet Place 1 tablet (0.4 mg total) under the tongue every 5 (five) minutes as needed for chest pain. 06/09/18   Hillary Bow, MD  potassium chloride (KLOR-CON) 20 MEQ packet Take by mouth 2 (two) times daily.  [provider]  Rivaroxaban (XARELTO) 15 MG TABS tablet Take 1 tablet (15 mg total) by mouth daily with supper. Hold Xarelto for 3 days.  Start on January 09, 2019. 01/09/19   [name], MD  senna (SENOKOT) 8.6 MG TABS tablet Take 1 tablet (8.6 mg total) by mouth daily as needed for mild constipation. 01/05/19   [name], MD  silver sulfADIAZINE (SILVADENE) 1 % cream Apply 1 application topically daily. Patient not taking: Reported on 06/24/2019 01/30/19   Kris Hartmann, NP  vitamin C (VITAMIN C) 250 MG tablet Take 1 tablet (250 mg total) by mouth 2 (two) times daily. Patient not taking: Reported on 06/24/2019 01/05/19   [name], MD  zinc sulfate 220 (50 Zn) MG capsule Take 220 mg by mouth daily.    [provider]    Allergies Contrast media [iodinated diagnostic agents], Iohexol, Metrizamide, and Latex  Family History  Problem Relation Age of Onset  . Hypertension Mother   . Diabetes Mother   . Prostate cancer Neg Hx   . Bladder Cancer Neg Hx   . Kidney disease Neg Hx     Social History Social History    Tobacco Use  . Smoking status: Former Smoker    Packs/day: 0.25    Types: Cigarettes    Quit date: 10/14/1994    Years since quitting: 24.9  . Smokeless tobacco: Never Used  Substance Use Topics  . Alcohol use: No    Alcohol/week: 0.0 standard drinks  . Drug use: No    Review of Systems Level 5 caveat: Review of systems limited due to altered mental status  Cardiovascular: Denies chest pain. Respiratory: Denies shortness of breath. Gastrointestinal: No vomiting. Musculoskeletal: Negative for back pain. Skin: Negative for rash. Neurological: Negative for headache.   ____________________________________________   PHYSICAL EXAM:  VITAL SIGNS: ED Triage Vitals  Enc Vitals Group     BP 09/16/19 0653 (!) 203/90     Pulse Rate 09/16/19 0653 (!) 57     Resp 09/16/19 0653 18     Temp 09/16/19 0653 98.2 F (36.8 C)     Temp Source 09/16/19 0653 Oral     SpO2 09/16/19 0653 94 %     Weight 09/16/19 0709 (S) 178 lb 9.2 oz (81 kg)     Height 09/16/19 0709 (S) 5\' 7"  (1.702 m)     Head Circumference --      Peak Flow --      Pain Score 09/16/19 0652 0     Pain Loc --      Pain Edu? --      Excl. in [place]? --     Constitutional: Alert, oriented to person and place.  Appears mildly confused, but overall responding appropriately to questions. Eyes: Conjunctivae are normal.  EOMI.  PERRLA. Head: Atraumatic. Nose: No congestion/rhinnorhea. Mouth/Throat: Mucous membranes are moist.   Neck: Normal range of motion.  Cardiovascular: Normal rate, regular rhythm. Grossly normal heart sounds.  Good peripheral circulation. Respiratory: Normal respiratory effort.  No retractions. Lungs CTAB. Gastrointestinal: Soft and nontender. No distention.  Genitourinary: No flank tenderness. Musculoskeletal: Extremities warm and well perfused.  Neurologic:  Normal speech and language.  Motor intact in all extremities. Skin:  Skin is warm and dry. No rash noted. Psychiatric: Calm and  cooperative.  ____________________________________________   LABS (all labs ordered are listed, but only abnormal results are displayed)  Labs Reviewed  COMPREHENSIVE METABOLIC PANEL - Abnormal; Notable for the following components:  Result Value   Glucose, Bld 265 (*)    BUN 32 (*)    Alkaline Phosphatase 132 (*)    GFR calc non Af Amer 55 (*)    All other components within normal limits  CBC - Abnormal; Notable for the following components:   WBC 11.0 (*)    All other components within normal limits  URINALYSIS, COMPLETE (UACMP) WITH MICROSCOPIC - Abnormal; Notable for the following components:   Glucose, UA 250 (*)    Hgb urine dipstick SMALL (*)    Protein, ur 100 (*)    Bacteria, UA RARE (*)    All other components within normal limits  BRAIN NATRIURETIC PEPTIDE - Abnormal; Notable for the following components:   B Natriuretic Peptide 734.0 (*)    All other components within normal limits  TROPONIN I (HIGH SENSITIVITY) - Abnormal; Notable for the following components:   Troponin I (High Sensitivity) 44 (*)    All other components within normal limits  LACTIC ACID, PLASMA   ____________________________________________  EKG  ED ECG REPORT I, Arta Silence, the attending physician, personally viewed and interpreted this ECG.  Date: 09/16/2019 EKG Time: 643 Rate: 53 Rhythm: normal sinus rhythm with PVCs QRS Axis: Left axis Intervals: normal ST/T Wave abnormalities: Nonspecific abnormalities Narrative Interpretation: no evidence of acute ischemia  ____________________________________________  RADIOLOGY  CT head: No acute abnormality CXR: No acute abnormality  ____________________________________________   PROCEDURES  Procedure(s) performed: No  Procedures  Critical Care performed: No ____________________________________________   INITIAL IMPRESSION / ASSESSMENT AND PLAN / ED COURSE  Pertinent labs & imaging results that were available  during my care of the patient were reviewed by me and considered in my medical decision making (see chart for details).  85 year old male with PMH as noted above including atrial fibrillation, CHF, prostate cancer, and peripheral artery disease status post bilateral below-knee amputations presents with altered mental status.  Per EMS, the wife also reports malodorous urine and slurred speech.  I reviewed the past medical records in [place].  The patient was most recently admitted in February with non-ST elevation MI, and previously last year for Covid.  On exam today, he is alert and oriented x2.  He appears possibly mildly confused, but is generally responding appropriately to questions.  He denies any acute complaints.  Neurologic exam is nonfocal.  The remainder of the exam is as described above.  Overall presentation is most consistent with UTI or other infectious cause, but differential also includes dehydration, metabolic etiology, or less likely encephalopathy related to hypertension or cardiac etiology.  The patient has no neuro deficits at this time, and I do not suspect acute stroke or TIA.  We will obtain CT head, chest x-ray, lab work-up, give the patient his normal Imdur for blood pressure, and reassess.  ----------------------------------------- 11:15 AM on 09/16/2019 -----------------------------------------  The patient's son is now here and confirms that the patient is at his baseline mental status.  The blood pressure has markedly improved.  Work-up is overall reassuring.  The basic labs show no significant findings.  The patient's troponin is slightly elevated, but this appears to be his baseline based on prior results and likely related to his CHF.  The BNP is also elevated but improved from prior.  The patient has no chest pain, acute shortness of breath, or other significant symptoms.  There is no indication for serial enzymes or further cardiac work-up.  CT head is also  negative for acute findings.  Urinalysis shows some  WBCs, but given the lack of significant bacteria, leukocytes, or elevated serum WBC, clinically there is no evidence of UTI.  Especially given that the patient is back to his baseline and has no acute symptoms, there is no indication to treat for UTI at this time.  I counseled the patient and his son on the results of the work-up.  The patient is eager to go home.  He is stable for discharge at this time.  Return precautions given, and he expresses understanding.  ____________________________________________   FINAL CLINICAL IMPRESSION(S) / ED DIAGNOSES  Final diagnoses:  Altered mental status, unspecified altered mental status type      NEW MEDICATIONS STARTED DURING THIS VISIT:  New Prescriptions   No medications on file     Note:  This document was prepared using Dragon voice recognition software and may include unintentional dictation errors.    Arta Silence, MD 09/16/19 1116

## 2019-09-16 NOTE — ED Notes (Signed)
Patient has been changed and repositioned.

## 2019-09-16 NOTE — Discharge Instructions (Signed)
The lab work-up showed no significant findings.  Your urine shows some white blood cells but no other signs of a urinary tract infection.  Return to the ER for new, worsening, or recurrent change in mental status, confusion, weakness, fever, urinary symptoms, worsening shortness of breath, vomiting, or any other new or worsening symptoms that concern you.

## 2019-09-16 NOTE — ED Notes (Signed)
Patient transported to CT 

## 2019-09-18 ENCOUNTER — Ambulatory Visit (INDEPENDENT_AMBULATORY_CARE_PROVIDER_SITE_OTHER): Payer: Medicare Other | Admitting: Vascular Surgery

## 2019-09-18 ENCOUNTER — Other Ambulatory Visit: Payer: Self-pay

## 2019-09-18 ENCOUNTER — Encounter (INDEPENDENT_AMBULATORY_CARE_PROVIDER_SITE_OTHER): Payer: Self-pay | Admitting: Vascular Surgery

## 2019-09-18 VITALS — BP 181/75 | HR 49 | Ht 67.0 in | Wt 175.0 lb

## 2019-09-18 DIAGNOSIS — Z794 Long term (current) use of insulin: Secondary | ICD-10-CM | POA: Diagnosis not present

## 2019-09-18 DIAGNOSIS — E119 Type 2 diabetes mellitus without complications: Secondary | ICD-10-CM

## 2019-09-18 DIAGNOSIS — I10 Essential (primary) hypertension: Secondary | ICD-10-CM

## 2019-09-18 DIAGNOSIS — T879 Unspecified complications of amputation stump: Secondary | ICD-10-CM

## 2019-09-18 NOTE — Progress Notes (Signed)
MRN : [id number]  [name] is a 85 y.o. (May 20, 1935) male who presents with chief complaint of  Chief Complaint  Patient presents with  . Follow-up    4 week  no studies  .  History of Present Illness: Patient returns today in follow up of his right below-knee amputation wound.  This has basically healed at this point.  There is only a dime size scab in the midportion of the wound with no erythema, drainage, or open portion of the wound at this point.  No fevers or chills or signs of systemic infection.  He is a very poor historian and his son provides most of the history.  Current Outpatient Medications  Medication Sig Dispense Refill  . acetaminophen (TYLENOL) 325 MG tablet Take 650 mg by mouth every 6 (six) hours as needed.    Marland Kitchen amiodarone (PACERONE) 200 MG tablet Take 200 mg by mouth daily.    Marland Kitchen aspirin EC 81 MG tablet Take 1 tablet (81 mg total) by mouth daily. 150 tablet 2  . bisacodyl (DULCOLAX) 5 MG EC tablet Take 1 tablet (5 mg total) by mouth daily as needed for moderate constipation. 30 tablet 0  . diltiazem (CARDIZEM CD) 120 MG 24 hr capsule Take 120 mg by mouth daily.    . Ensure Max Protein (ENSURE MAX PROTEIN) LIQD Take 330 mLs (11 oz total) by mouth 2 (two) times daily.    . ferrous sulfate 325 (65 FE) MG tablet Take 1 tablet (325 mg total) by mouth daily with breakfast. 30 tablet 3  . Insulin Glargine (BASAGLAR KWIKPEN) 100 UNIT/ML SMARTSIG:15 Unit(s) SUB-Q Every Night    . insulin glargine (LANTUS) 100 UNIT/ML injection Inject 0.15 mLs (15 Units total) into the skin at bedtime. 1 vial 0  . insulin lispro (HUMALOG) 100 UNIT/ML injection Inject 0.03 mLs (3 Units total) into the skin 3 (three) times daily with meals. (Patient taking differently: Inject 3 Units into the skin 3 (three) times daily with meals. Plus 1 additional unit for every 50 BG points over 200 (max 5 additional units per dose)) 10 mL 0  . isosorbide mononitrate (IMDUR) 60 MG 24 hr tablet Take  1 tablet (60 mg total) by mouth daily. 30 tablet 0  . metoprolol succinate (TOPROL-XL) 25 MG 24 hr tablet Take 25 mg by mouth daily.    . metoprolol tartrate (LOPRESSOR) 25 MG tablet Take 25 mg by mouth 3 (three) times daily.    . Multiple Vitamin (MULTIVITAMIN WITH MINERALS) TABS tablet Take 1 tablet by mouth daily.    . nitroGLYCERIN (NITROSTAT) 0.4 MG SL tablet Place 1 tablet (0.4 mg total) under the tongue every 5 (five) minutes as needed for chest pain. 20 tablet 0  . NOVOLOG FLEXPEN 100 UNIT/ML FlexPen     . Rivaroxaban (XARELTO) 15 MG TABS tablet Take 1 tablet (15 mg total) by mouth daily with supper. Hold Xarelto for 3 days.  Start on January 09, 2019. 30 tablet 1  . senna (SENOKOT) 8.6 MG TABS tablet Take 1 tablet (8.6 mg total) by mouth daily as needed for mild constipation. 120 tablet 0  . atorvastatin (LIPITOR) 80 MG tablet Take 80 mg by mouth every evening.    . chlorthalidone (HYGROTON) 25 MG tablet Take 25 mg by mouth daily.    . Cholecalciferol (VITAMIN D-3) 25 MCG (1000 UT) CAPS Take by mouth.    . collagenase (SANTYL) ointment Apply 1 application topically daily. (Patient not taking: Reported on  06/24/2019) 30 g 1  . divalproex (DEPAKOTE ER) 500 MG 24 hr tablet Take 1 tablet (500 mg total) by mouth daily. (Patient not taking: Reported on 06/24/2019)    . doxycycline (VIBRAMYCIN) 100 MG capsule Take 100 mg by mouth 2 (two) times daily.    Marland Kitchen gabapentin (NEURONTIN) 300 MG capsule Take 300 mg by mouth 3 (three) times daily.     Marland Kitchen HYDROcodone-acetaminophen (NORCO/VICODIN) 5-325 MG tablet Take 1 tablet by mouth every 6 (six) hours as needed for moderate pain (pain score 4-6). (Patient not taking: Reported on 06/24/2019) 10 tablet 0  . metFORMIN (GLUCOPHAGE) 500 MG tablet Take by mouth daily.    . potassium chloride (KLOR-CON) 20 MEQ packet Take by mouth 2 (two) times daily.    . silver sulfADIAZINE (SILVADENE) 1 % cream Apply 1 application topically daily. (Patient not taking: Reported on  06/24/2019) 50 g 0  . vitamin C (VITAMIN C) 250 MG tablet Take 1 tablet (250 mg total) by mouth 2 (two) times daily. (Patient not taking: Reported on 06/24/2019)    . zinc sulfate 220 (50 Zn) MG capsule Take 220 mg by mouth daily.     Current Facility-Administered Medications  Medication Dose Route Frequency Provider Last Rate Last Admin  . diphenhydrAMINE (BENADRYL) injection 50 mg  50 mg Intravenous Once PRN Stegmayer, Kimberly A, PA-C      . famotidine (PEPCID) tablet 40 mg  40 mg Oral Once PRN Stegmayer, Abbe [place]        Past Medical History:  Diagnosis Date  . Arthritis   . Atrial fibrillation ([place])   . Carcinoma of prostate ([place])   . CHF (congestive heart failure) ([place])   . Chronic kidney disease   . Diabetes mellitus without complication ([place])   . ED (erectile dysfunction)   . Frequent urination   . Hyperlipidemia   . Hypertension   . Moderate mitral insufficiency   . Peripheral vascular disease ([place])   . Pneumonia 04/2016  . Prostate cancer ([place])   . Sleep apnea    OSA--USE C-PAP  . Ulcer of left foot due to type 2 diabetes mellitus ([place])   . Urinary stress incontinence, male     Past Surgical History:  Procedure Laterality Date  . AMPUTATION Left 01/12/2017   Procedure: AMPUTATION BELOW KNEE;  Surgeon: Algernon Huxley, MD;  Location: [facility] ORS;  Service: General;  Laterality: Left;  . AMPUTATION Right 12/27/2018   Procedure: AMPUTATION BELOW KNEE;  Surgeon: Algernon Huxley, MD;  Location: [facility] OR[facility];  Service: General;  Laterality: Right;  . APLIGRAFT PLACEMENT Left 10/15/2016   Procedure: APLIGRAFT PLACEMENT;  Surgeon: Albertine Patricia, DPM;  Location: [facility];  Service: Podiatry;  Laterality: Left;  necrotic ulcer  . CHOLECYSTECTOMY    . COLONOSCOPY WITH PROPOFOL N/A 01/02/2019   Procedure: COLONOSCOPY WITH PROPOFOL;  Surgeon: Lucilla Lame, MD;  Location: [facility] ENDOSCOPY;  Service: Endoscopy;  Laterality: N/A;  . COLONOSCOPY WITH PROPOFOL N/A 01/05/2019   Procedure:  COLONOSCOPY WITH PROPOFOL;  Surgeon: Lucilla Lame, MD;  Location: [facility] ENDOSCOPY;  Service: Endoscopy;  Laterality: N/A;  . ESOPHAGOGASTRODUODENOSCOPY (EGD) WITH PROPOFOL N/A 01/02/2019   Procedure: ESOPHAGOGASTRODUODENOSCOPY (EGD) WITH PROPOFOL;  Surgeon: Lucilla Lame, MD;  Location: [facility] ENDOSCOPY;  Service: Endoscopy;  Laterality: N/A;  . EYE SURGERY Bilateral    Cataract Extraction with IOL  . HIP ARTHROPLASTY Right 10/13/2018   Procedure: ARTHROPLASTY BIPOLAR HIP (HEMIARTHROPLASTY);  Surgeon: Earnestine Leys, MD;  Location: [facility] OR[facility];  Service: Orthopedics;  Laterality: Right;  . IRRIGATION AND DEBRIDEMENT FOOT Left 10/15/2016   Procedure: IRRIGATION AND DEBRIDEMENT FOOT-EXCISIONAL DEBRIDEMENT OF SKIN 3RD, 4TH AND 5TH TOES WITH APPLICATION OF APLIGRAFT;  Surgeon: Albertine Patricia, DPM;  Location: [facility] OR[facility];  Service: Podiatry;  Laterality: Left;  necrotic,gangrene  . IRRIGATION AND DEBRIDEMENT FOOT Left 12/09/2016   Procedure: IRRIGATION AND DEBRIDEMENT FOOT-MUSCLE/FASCIA, RESECTION OF FOURTH AND FIFTH METATARSAL NECROTIC BONE;  Surgeon: Albertine Patricia, DPM;  Location: [facility];  Service: Podiatry;  Laterality: Left;  . IRRIGATION AND DEBRIDEMENT FOOT Right 12/23/2018   Procedure: IRRIGATION AND DEBRIDEMENT FOOT and wound vac application;  Surgeon: Samara Deist, DPM;  Location: [facility];  Service: Podiatry;  Laterality: Right;  . LEFT HEART CATH N/A 06/25/2019   Procedure: Left Heart Cath and Coronary Angiography;  Surgeon: Isaias Cowman, MD;  Location: [place] [facility];  Service: Cardiovascular;  Laterality: N/A;  . LEFT HEART CATH AND CORONARY ANGIOGRAPHY N/A 06/05/2018   Procedure: LEFT HEART CATH AND CORONARY ANGIOGRAPHY;  Surgeon: Yolonda Kida, MD;  Location: [place] [facility] LAB;  Service: Cardiovascular;  Laterality: N/A;  . LOWER EXTREMITY ANGIOGRAPHY Left 08/16/2016   Procedure: Lower Extremity Angiography;  Surgeon: Algernon Huxley, MD;  Location: [place] [facility] LAB;  Service:  Cardiovascular;  Laterality: Left;  . LOWER EXTREMITY ANGIOGRAPHY Left 09/01/2016   Procedure: Lower Extremity Angiography;  Surgeon: Algernon Huxley, MD;  Location: [place] [facility] LAB;  Service: Cardiovascular;  Laterality: Left;  . LOWER EXTREMITY ANGIOGRAPHY Left 10/14/2016   Procedure: Lower Extremity Angiography;  Surgeon: Algernon Huxley, MD;  Location: [place] [facility] LAB;  Service: Cardiovascular;  Laterality: Left;  . LOWER EXTREMITY ANGIOGRAPHY Left 12/20/2016   Procedure: Lower Extremity Angiography;  Surgeon: Algernon Huxley, MD;  Location: [place] [facility];  Service: Cardiovascular;  Laterality: Left;  . LOWER EXTREMITY ANGIOGRAPHY Right 12/18/2018   Procedure: LOWER EXTREMITY ANGIOGRAPHY;  Surgeon: Algernon Huxley, MD;  Location: [place] [facility] LAB;  Service: Cardiovascular;  Laterality: Right;  . LOWER EXTREMITY INTERVENTION  09/01/2016   Procedure: Lower Extremity Intervention;  Surgeon: Algernon Huxley, MD;  Location: [place] [facility];  Service: Cardiovascular;;  . PROSTATE SURGERY     removal  . TONSILLECTOMY     as a child   Social History        Tobacco Use  . Smoking status: Former Smoker    Packs/day: 0.25    Types: Cigarettes    Quit date: 10/14/1994    Years since quitting: 24.7  . Smokeless tobacco: Never Used  Substance Use Topics  . Alcohol use: No    Alcohol/week: 0.0 standard drinks  . Drug use: No         Family History  Problem Relation Age of Onset  . Hypertension Mother   . Diabetes Mother   . Prostate cancer Neg Hx   . Bladder Cancer Neg Hx   . Kidney disease Neg Hx           Allergies  Allergen Reactions  . Contrast Media [Iodinated Diagnostic Agents] Shortness Of Breath    SOB  . Iohexol Shortness Of Breath    Desc: Respiratory Distress, Laryngedema, diaphoresis, Onset Date: [id number]   . Metrizamide Shortness Of Breath    SOB SOB SOB   . Latex Rash     REVIEW OF SYSTEMS(Negative unless  checked) Constitutional: [] ????Weight loss[] ????Fever[] ????Chills Cardiac:[] ????Chest pain[] ????Atrial Fibrillation[] ????Palpitations [] ????Shortness of breath when laying flat [] ????Shortness of breath with exertion. [] ????Shortness of breath at  rest Vascular: [] ????Pain in legs with walking[] ????Pain in legswith standing[] ????Pain in legs when laying flat [] ????Claudication [] ????Pain in feet when laying flat [] ????History of DVT [] ????Phlebitis [] ????Swelling in legs [] ????Varicose veins [] ????Non-healing ulcers Pulmonary: [] ????Uses home oxygen [] ????Productive cough[] ????Hemoptysis [] ????Wheeze [] ????COPD [] ????Asthma Neurologic: [] ????Dizziness[] ????Seizures [] ????Blackouts[] ????History of stroke [] ????History of TIA[] ????Aphasia [] ????Temporary Blindness[] ????Weaknessor numbness in arm [] ????Weakness or numbnessin leg Musculoskeletal:[] ????Joint swelling [] ????Joint pain [] ????Low back pain [] ????History of Knee Replacement [x] ????Arthritis [] ????back Surgeries[] ????Spinal Stenosis  Hematologic:[] ????Easy bruising[] ????Easy bleeding [] ????Hypercoagulable state [x] ????Anemic Gastrointestinal:[] ????Diarrhea [] ????Vomiting[] ????Gastroesophageal reflux/heartburn[] ????Difficulty swallowing. [] ????Abdominal pain Genitourinary: [x] ????Chronic kidney disease [] ????Difficulturination [] ????Anuric[] ????Blood in urine [] ????Frequenturination [] ????Burning with urination[] ????Hematuria Skin: [] ????Rashes [] ????Ulcers [] ????Wounds Psychological: [] ????History of anxiety[] ????History of major depression [x] ????Memory Difficulties    Physical Examination  BP (!) 181/75   Pulse (!) 49   Ht 5\' 7"  (1.702 m)   Wt 175 lb (79.4 kg)   BMI 27.41 kg/m  Gen:  WD/WN, NAD Head: [place]/AT, No temporalis wasting. Ear/Nose/Throat: Hearing grossly intact, nares w/o erythema or drainage Eyes:  Conjunctiva clear. Sclera non-icteric Neck: Supple.  Trachea midline Pulmonary:  Good air movement, no use of accessory muscles.  Cardiac: Irregularly irregular Vascular:  Vessel Right Left  Radial Palpable Palpable                   Musculoskeletal: M/S 5/5 throughout.  Bilateral below-knee amputations.  The right BKA stump is essentially healed with only a dime sized scab in the midportion of the wound.  No erythema or drainage. Neurologic: Sensation grossly intact in extremities.  Symmetrical.  Speech is fluent.  Psychiatric: Judgment and insight are poor, poor historian Dermatologic: No rashes or ulcers noted.  No cellulitis or open wounds.       Labs Recent Results (from the past 2160 hour(s))  Basic metabolic panel     Status: Abnormal   Collection Time: 06/24/19  2:05 AM  Result Value Ref Range   Sodium 134 (L) 135 - 145 mmol/L   Potassium 4.1 3.5 - 5.1 mmol/L   Chloride 99 98 - 111 mmol/L   CO2 26 22 - 32 mmol/L   Glucose, Bld 526 (HH) 70 - 99 mg/dL    Comment: CRITICAL RESULT CALLED TO, READ BACK BY AND VERIFIED WITH APRIL BRUMGARD ON 06/24/19 AT 0256 [facility]    BUN 30 (H) 8 - 23 mg/dL   Creatinine, Ser 1.40 (H) 0.61 - 1.24 mg/dL   Calcium 8.8 (L) 8.9 - 10.3 mg/dL   GFR calc non Af Amer 46 (L) >60 mL/min   GFR calc Af Amer 54 (L) >60 mL/min   Anion gap 9 5 - 15    Comment: Performed at [facility], [address] [place], [place] [address]  CBC with Differential     Status: Abnormal   Collection Time: 06/24/19  2:05 AM  Result Value Ref Range   WBC 10.7 (H) 4.0 - 10.5 K/uL   RBC 4.99 4.22 - 5.81 MIL/uL   Hemoglobin 14.2 13.0 - 17.0 g/dL   HCT 42.6 39.0 - 52.0 %   MCV 85.4 80.0 - 100.0 fL   MCH 28.5 26.0 - 34.0 pg   MCHC 33.3 30.0 - 36.0 g/dL   RDW 12.8 11.5 - 15.5 %   Platelets 265 150 - 400 K/uL   nRBC 0.0 0.0 - 0.2 %   Neutrophils Relative % 73 %   Neutro Abs 7.9 (H) 1.7 - 7.7 K/uL   Lymphocytes Relative 8 %   Lymphs Abs 0.9 0.7 - 4.0 K/uL     Monocytes Relative 7 %  Monocytes Absolute 0.7 0.1 - 1.0 K/uL   Eosinophils Relative 10 %   Eosinophils Absolute 1.0 (H) 0.0 - 0.5 K/uL   Basophils Relative 1 %   Basophils Absolute 0.1 0.0 - 0.1 K/uL   Immature Granulocytes 1 %   Abs Immature Granulocytes 0.05 0.00 - 0.07 K/uL    Comment: Performed at [facility], [place], [place] [address]  Troponin I (High Sensitivity)     Status: Abnormal   Collection Time: 06/24/19  2:05 AM  Result Value Ref Range   Troponin I (High Sensitivity) 34 (H) <18 ng/L    Comment: (NOTE) Elevated high sensitivity troponin I (hsTnI) values and significant  changes across serial measurements may suggest ACS but many other  chronic and acute conditions are known to elevate hsTnI results.  Refer to the "Links" section for chest pain algorithms and additional  guidance. Performed at [facility], [address] [place], [place] [address]   APTT     Status: None   Collection Time: 06/24/19  2:05 AM  Result Value Ref Range   aPTT 30 24 - 36 seconds    Comment: Performed at [facility], [address] [place], [place] [address]  Protime-INR     Status: None   Collection Time: 06/24/19  2:05 AM  Result Value Ref Range   Prothrombin Time 14.5 11.4 - 15.2 seconds   INR 1.1 0.8 - 1.2    Comment: (NOTE) INR goal varies based on device and disease states. Performed at [facility], [address], [place], [place] [address]   Respiratory Panel by RT PCR (Flu A&B, Covid) - Nasopharyngeal Swab     Status: None   Collection Time: 06/24/19  2:06 AM   Specimen: Nasopharyngeal Swab  Result Value Ref Range   SARS Coronavirus 2 by RT PCR NEGATIVE NEGATIVE    Comment: (NOTE) SARS-CoV-2 target nucleic acids are NOT DETECTED. The SARS-CoV-2 RNA is generally detectable in upper respiratoy specimens during the acute phase of infection. The lowest concentration of SARS-CoV-2 viral copies this assay can  detect is 131 copies/mL. A negative result does not preclude SARS-Cov-2 infection and should not be used as the sole basis for treatment or other patient management decisions. A negative result may occur with  improper specimen collection/handling, submission of specimen other than nasopharyngeal swab, presence of viral mutation(s) within the areas targeted by this assay, and inadequate number of viral copies (<131 copies/mL). A negative result must be combined with clinical observations, patient history, and epidemiological information. The expected result is Negative. Fact Sheet for Patients:  PinkCheek.be Fact Sheet for Healthcare Providers:  GravelBags.it This test is not yet ap proved or cleared by the [place] FDA and  has been authorized for detection and/or diagnosis of SARS-CoV-2 by FDA under an Emergency Use Authorization (EUA). This EUA will remain  in effect (meaning this test can be used) for the duration of the COVID-19 declaration under Section 564(b)(1) of the Act, 21 U.S.C. section 360bbb-3(b)(1), unless the authorization is terminated or revoked sooner.    Influenza A by PCR NEGATIVE NEGATIVE   Influenza B by PCR NEGATIVE NEGATIVE    Comment: (NOTE) The Xpert Xpress SARS-CoV-2/FLU/RSV assay is intended as an aid in  the diagnosis of influenza from Nasopharyngeal swab specimens and  should not be used as a sole basis for treatment. Nasal washings and  aspirates are unacceptable for Xpert Xpress SARS-CoV-2/FLU/RSV  testing. Fact Sheet for Patients: PinkCheek.be  Fact Sheet for Healthcare Providers: GravelBags.it This test is not yet approved or cleared by the [place] FDA and  has been authorized for detection and/or diagnosis of SARS-CoV-2 by  FDA under an Emergency Use Authorization (EUA). This EUA will remain  in effect (meaning this test  can be used) for the duration of the  Covid-19 declaration under Section 564(b)(1) of the Act, 21  U.S.C. section 360bbb-3(b)(1), unless the authorization is  terminated or revoked. Performed at [facility], [place], [place] [address]   Troponin I (High Sensitivity)     Status: Abnormal   Collection Time: 06/24/19  3:59 AM  Result Value Ref Range   Troponin I (High Sensitivity) 62 (H) <18 ng/L    Comment: READ BACK AND VERIFIED WITH APRIL BRUMGARD ON 06/24/19 AT 0450 [place] (NOTE) Elevated high sensitivity troponin I (hsTnI) values and significant  changes across serial measurements may suggest ACS but many other  chronic and acute conditions are known to elevate hsTnI results.  Refer to the "Links" section for chest pain algorithms and additional  guidance. Performed at [facility], [address] [place], [place] [address]   Glucose, capillary     Status: Abnormal   Collection Time: 06/24/19  4:34 AM  Result Value Ref Range   Glucose-Capillary 331 (H) 70 - 99 mg/dL  Heparin level (unfractionated)     Status: Abnormal   Collection Time: 06/24/19  5:59 AM  Result Value Ref Range   Heparin Unfractionated <0.10 (L) 0.30 - 0.70 IU/mL    Comment: (NOTE) If heparin results are below expected values, and patient dosage has  been confirmed, suggest follow up testing of antithrombin III levels. Performed at [facility], [address] [place], [place] [address]   Glucose, capillary     Status: Abnormal   Collection Time: 06/24/19  8:04 AM  Result Value Ref Range   Glucose-Capillary 263 (H) 70 - 99 mg/dL  Glucose, capillary     Status: Abnormal   Collection Time: 06/24/19 10:45 AM  Result Value Ref Range   Glucose-Capillary 297 (H) 70 - 99 mg/dL  Hemoglobin A1c     Status: Abnormal   Collection Time: 06/24/19 10:47 AM  Result Value Ref Range   Hgb A1c MFr Bld 9.4 (H) 4.8 - 5.6 %    Comment: (NOTE) Pre diabetes:           5.7%-6.4% Diabetes:              >6.4% Glycemic control for   <7.0% adults with diabetes    Mean Plasma Glucose 223.08 mg/dL    Comment: Performed at [facility] [address], [place], [place] [address]  Troponin I (High Sensitivity)     Status: Abnormal   Collection Time: 06/24/19 10:47 AM  Result Value Ref Range   Troponin I (High Sensitivity) 116 (HH) <18 ng/L    Comment: CRITICAL RESULT CALLED TO, READ BACK BY AND VERIFIED WITH TAMMY TODD ON 06/24/19 AT 1143 BY JAG (NOTE) Elevated high sensitivity troponin I (hsTnI) values and significant  changes across serial measurements may suggest ACS but many other  chronic and acute conditions are known to elevate hsTnI results.  Refer to the "Links" section for chest pain algorithms and additional  guidance. Performed at [facility], [address] [place], [place] [address]   Glucose, capillary     Status: Abnormal   Collection Time: 06/24/19 11:49 AM  Result Value  Ref Range   Glucose-Capillary 299 (H) 70 - 99 mg/dL  Brain natriuretic peptide     Status: Abnormal   Collection Time: 06/24/19  1:46 PM  Result Value Ref Range   B Natriuretic Peptide 887.0 (H) 0.0 - 100.0 pg/mL    Comment: Performed at [facility], [address] [place], [place] [address]  Glucose, capillary     Status: Abnormal   Collection Time: 06/24/19  3:11 PM  Result Value Ref Range   Glucose-Capillary 206 (H) 70 - 99 mg/dL  Heparin level (unfractionated)     Status: Abnormal   Collection Time: 06/24/19  3:52 PM  Result Value Ref Range   Heparin Unfractionated 0.17 (L) 0.30 - 0.70 IU/mL    Comment: (NOTE) If heparin results are below expected values, and patient dosage has  been confirmed, suggest follow up testing of antithrombin III levels. Performed at [facility], [address] [place], [place] [address]   Glucose, capillary     Status: Abnormal   Collection Time: 06/24/19  4:57 PM  Result Value Ref  Range   Glucose-Capillary 108 (H) 70 - 99 mg/dL  Glucose, capillary     Status: Abnormal   Collection Time: 06/24/19  8:33 PM  Result Value Ref Range   Glucose-Capillary 193 (H) 70 - 99 mg/dL  Troponin I (High Sensitivity)     Status: Abnormal   Collection Time: 06/25/19  1:19 AM  Result Value Ref Range   Troponin I (High Sensitivity) 73 (H) <18 ng/L    Comment: (NOTE) Elevated high sensitivity troponin I (hsTnI) values and significant  changes across serial measurements may suggest ACS but many other  chronic and acute conditions are known to elevate hsTnI results.  Refer to the "Links" section for chest pain algorithms and additional  guidance. Performed at [facility], [address] [place], [place] [id number]   Basic metabolic panel     Status: Abnormal   Collection Time: 06/25/19  1:19 AM  Result Value Ref Range   Sodium 139 135 - 145 mmol/L   Potassium 3.8 3.5 - 5.1 mmol/L   Chloride 101 98 - 111 mmol/L   CO2 30 22 - 32 mmol/L   Glucose, Bld 174 (H) 70 - 99 mg/dL   BUN 23 8 - 23 mg/dL   Creatinine, Ser 1.24 0.61 - 1.24 mg/dL   Calcium 8.7 (L) 8.9 - 10.3 mg/dL   GFR calc non Af Amer 54 (L) >60 mL/min   GFR calc Af Amer >60 >60 mL/min   Anion gap 8 5 - 15    Comment: Performed at [facility], [address] [place], [place] [address]  Lipid panel     Status: Abnormal   Collection Time: 06/25/19  1:19 AM  Result Value Ref Range   Cholesterol 121 0 - 200 mg/dL   Triglycerides 57 <150 mg/dL   HDL 34 (L) >40 mg/dL   Total CHOL/HDL Ratio 3.6 RATIO   VLDL 11 0 - 40 mg/dL   LDL Cholesterol 76 0 - 99 mg/dL    Comment:        Total Cholesterol/HDL:CHD Risk Coronary Heart Disease Risk Table                     Men   Women  1/2 Average Risk   3.4   3.3  Average Risk       5.0   4.4  2 X Average Risk  9.6   7.1  3 X Average Risk  23.4   11.0        Use the calculated Patient Ratio above and the CHD Risk Table to determine the patient's CHD  Risk.        ATP III CLASSIFICATION (LDL):  <100     mg/dL   Optimal  100-129  mg/dL   Near or Above                    Optimal  130-159  mg/dL   Borderline  160-189  mg/dL   High  >190     mg/dL   Very High Performed at [facility], [address] [place], [place] [address]   CBC     Status: Abnormal   Collection Time: 06/25/19  1:19 AM  Result Value Ref Range   WBC 13.2 (H) 4.0 - 10.5 K/uL   RBC 4.50 4.22 - 5.81 MIL/uL   Hemoglobin 13.0 13.0 - 17.0 g/dL   HCT 38.4 (L) 39.0 - 52.0 %   MCV 85.3 80.0 - 100.0 fL   MCH 28.9 26.0 - 34.0 pg   MCHC 33.9 30.0 - 36.0 g/dL   RDW 13.0 11.5 - 15.5 %   Platelets 268 150 - 400 K/uL   nRBC 0.0 0.0 - 0.2 %    Comment: Performed at [facility], [place], [place] [address]  Heparin level (unfractionated)     Status: None   Collection Time: 06/25/19  1:19 AM  Result Value Ref Range   Heparin Unfractionated 0.53 0.30 - 0.70 IU/mL    Comment: (NOTE) If heparin results are below expected values, and patient dosage has  been confirmed, suggest follow up testing of antithrombin III levels. Performed at [facility], [address] [place], [place] [address]   Glucose, capillary     Status: Abnormal   Collection Time: 06/25/19  8:12 AM  Result Value Ref Range   Glucose-Capillary 114 (H) 70 - 99 mg/dL  Glucose, capillary     Status: Abnormal   Collection Time: 06/25/19 11:57 AM  Result Value Ref Range   Glucose-Capillary 155 (H) 70 - 99 mg/dL  ECHOCARDIOGRAM COMPLETE     Status: None   Collection Time: 06/25/19  1:42 PM  Result Value Ref Range   Weight 2,740.76 oz   Height 67 in   BP 128/55 mmHg  Glucose, capillary     Status: Abnormal   Collection Time: 06/25/19  4:17 PM  Result Value Ref Range   Glucose-Capillary 193 (H) 70 - 99 mg/dL  Glucose, capillary     Status: Abnormal   Collection Time: 06/25/19  9:21 PM  Result Value Ref Range   Glucose-Capillary 338 (H) 70 - 99 mg/dL   Glucose, capillary     Status: Abnormal   Collection Time: 06/26/19  7:36 AM  Result Value Ref Range   Glucose-Capillary 248 (H) 70 - 99 mg/dL  Glucose, capillary     Status: Abnormal   Collection Time: 06/26/19 11:29 AM  Result Value Ref Range   Glucose-Capillary 202 (H) 70 - 99 mg/dL  Comprehensive metabolic panel     Status: Abnormal   Collection Time: 09/16/19  6:49 AM  Result Value Ref Range   Sodium 140 135 - 145 mmol/L   Potassium 4.1 3.5 - 5.1 mmol/L   Chloride 104 98 - 111 mmol/L   CO2 27 22 - 32 mmol/L   Glucose, Bld 265 (  H) 70 - 99 mg/dL    Comment: Glucose reference range applies only to samples taken after fasting for at least 8 hours.   BUN 32 (H) 8 - 23 mg/dL   Creatinine, Ser 1.21 0.61 - 1.24 mg/dL   Calcium 9.2 8.9 - 10.3 mg/dL   Total Protein 7.6 6.5 - 8.1 g/dL   Albumin 3.9 3.5 - 5.0 g/dL   AST 27 15 - 41 U/L   ALT 34 0 - 44 U/L   Alkaline Phosphatase 132 (H) 38 - 126 U/L   Total Bilirubin 0.9 0.3 - 1.2 mg/dL   GFR calc non Af Amer 55 (L) >60 mL/min   GFR calc Af Amer >60 >60 mL/min   Anion gap 9 5 - 15    Comment: Performed at [facility], [address] [place], [place] [address]  CBC     Status: Abnormal   Collection Time: 09/16/19  6:49 AM  Result Value Ref Range   WBC 11.0 (H) 4.0 - 10.5 K/uL   RBC 5.01 4.22 - 5.81 MIL/uL   Hemoglobin 14.7 13.0 - 17.0 g/dL   HCT 43.1 39.0 - 52.0 %   MCV 86.0 80.0 - 100.0 fL   MCH 29.3 26.0 - 34.0 pg   MCHC 34.1 30.0 - 36.0 g/dL   RDW 14.5 11.5 - 15.5 %   Platelets 234 150 - 400 K/uL   nRBC 0.0 0.0 - 0.2 %    Comment: Performed at [facility], [address] [place], [place] [address]  Brain natriuretic peptide     Status: Abnormal   Collection Time: 09/16/19  6:49 AM  Result Value Ref Range   B Natriuretic Peptide 734.0 (H) 0.0 - 100.0 pg/mL    Comment: Performed at [facility], [place], [place] [address]  Troponin I (High Sensitivity)     Status: Abnormal    Collection Time: 09/16/19  6:49 AM  Result Value Ref Range   Troponin I (High Sensitivity) 44 (H) <18 ng/L    Comment: (NOTE) Elevated high sensitivity troponin I (hsTnI) values and significant  changes across serial measurements may suggest ACS but many other  chronic and acute conditions are known to elevate hsTnI results.  Refer to the "Links" section for chest pain algorithms and additional  guidance. Performed at [facility], [address] [place], [place] [address]   Lactic acid, plasma     Status: None   Collection Time: 09/16/19  7:53 AM  Result Value Ref Range   Lactic Acid, Venous 1.4 0.5 - 1.9 mmol/L    Comment: Performed at [facility], [address] [place], [place] [address]  Urinalysis, Complete w Microscopic     Status: Abnormal   Collection Time: 09/16/19 10:20 AM  Result Value Ref Range   Color, Urine YELLOW YELLOW   APPearance CLEAR CLEAR   Specific Gravity, Urine 1.025 1.005 - 1.030   pH 5.5 5.0 - 8.0   Glucose, UA 250 (A) NEGATIVE mg/dL   Hgb urine dipstick SMALL (A) NEGATIVE   Bilirubin Urine NEGATIVE NEGATIVE   Ketones, ur NEGATIVE NEGATIVE mg/dL   Protein, ur 100 (A) NEGATIVE mg/dL   Nitrite NEGATIVE NEGATIVE   Leukocytes,Ua NEGATIVE NEGATIVE   Squamous Epithelial / LPF NONE SEEN 0 - 5   WBC, UA 11-20 0 - 5 WBC/hpf   RBC / HPF 0-5 0 - 5 RBC/hpf   Bacteria, UA RARE (A) NONE SEEN   Mucus PRESENT  Comment: Performed at [facility], [address] [place], [place] [address]    Radiology CT Head Wo Contrast  Result Date: 09/16/2019 CLINICAL DATA:  85 year old male with history of altered mental status. EXAM: CT HEAD WITHOUT CONTRAST TECHNIQUE: Contiguous axial images were obtained from the base of the skull through the vertex without intravenous contrast. COMPARISON:  Head CT 06/06/2018. FINDINGS: Brain: Mild cerebral atrophy. Patchy and confluent areas of decreased attenuation are noted throughout the deep and  periventricular white matter of the cerebral hemispheres bilaterally, compatible with chronic microvascular ischemic disease. No evidence of acute infarction, hemorrhage, hydrocephalus, extra-axial collection or mass lesion/mass effect. Vascular: No hyperdense vessel or unexpected calcification. Skull: Normal. Negative for fracture or focal lesion. Sinuses/Orbits: No acute finding. Other: None. IMPRESSION: 1. No acute intracranial abnormalities. 2. Mild cerebral atrophy with chronic microvascular ischemic changes in the cerebral white matter, as above. Electronically Signed   By: Vinnie Langton M.D.   On: 09/16/2019 08:52   DG Chest Portable 1 View  Result Date: 09/16/2019 CLINICAL DATA:  Mental status changes. EXAM: PORTABLE CHEST 1 VIEW COMPARISON:  06/24/2019 FINDINGS: The heart is mildly enlarged but stable. Stable tortuosity and calcification of the thoracic aorta. Stable eventration of the right hemidiaphragm with some overlying vascular crowding and atelectasis. Possible small right effusion. No definite infiltrates or edema. The bony thorax is intact. IMPRESSION: 1. Stable cardiac enlargement. 2. Stable eventration of the right hemidiaphragm with overlying vascular crowding and atelectasis. 3. Possible small right effusion. Electronically Signed   By: Marijo Sanes M.D.   On: 09/16/2019 08:55    Assessment/Plan Essential hypertension blood pressure control important in reducing the progression of atherosclerotic disease. On appropriate oral medications.   Type 2 diabetes mellitus treated with insulin (HCC) blood glucose control important in reducing the progression of atherosclerotic disease. Also, involved in wound healing. On appropriate medications.  BKA stump complication ([place]) His wound is basically healed now.  We are putting in a referral to biotech for evaluation for a below-knee amputation prosthesis.  His son appropriately asks if rehab would be a possibility when he gets his  prosthesis to help him learn to walk again and I think that will be the only way he ever learns to walk with bilateral lower extremity prostheses.  He does not have much help at home, and I think he would be unsafe starting his rehab at home.  [facility] which has done a good job with him in the past, and I would like to refer [facility] to [facility] for [facility]hab once he gets his prosthesis which is likely 6 to 8 weeks away.  I will plan a follow-up in about 3 months.    Leotis Pain, MD  09/18/2019 11:47 AM    This note was created with Dragon medical transcription system.  Any errors from dictation are purely unintentional

## 2019-09-18 NOTE — Assessment & Plan Note (Signed)
His wound is basically healed now.  We are putting in a referral to biotech for evaluation for a below-knee amputation prosthesis.  His son appropriately asks if rehab would be a possibility when he gets his prosthesis to help him learn to walk again and I think that will be the only way he ever learns to walk with bilateral lower extremity prostheses.  He does not have much help at home, and I think he would be unsafe starting his rehab at home.  They prefer [facility] which has done a good job with him in the past, and I would like to refer [facility] to [facility] for [facility]hab once he gets his prosthesis which is likely 6 to 8 weeks away.  I will plan a follow-up in about 3 months.

## 2019-10-26 ENCOUNTER — Other Ambulatory Visit: Payer: Self-pay

## 2019-10-26 ENCOUNTER — Ambulatory Visit (INDEPENDENT_AMBULATORY_CARE_PROVIDER_SITE_OTHER): Payer: Medicare Other | Admitting: Nurse Practitioner

## 2019-10-26 ENCOUNTER — Encounter (INDEPENDENT_AMBULATORY_CARE_PROVIDER_SITE_OTHER): Payer: Self-pay | Admitting: Nurse Practitioner

## 2019-10-26 VITALS — BP 167/75 | HR 48 | Ht 67.0 in | Wt 180.0 lb

## 2019-10-26 DIAGNOSIS — Z89512 Acquired absence of left leg below knee: Secondary | ICD-10-CM

## 2019-10-26 DIAGNOSIS — I10 Essential (primary) hypertension: Secondary | ICD-10-CM

## 2019-10-26 DIAGNOSIS — Z89511 Acquired absence of right leg below knee: Secondary | ICD-10-CM

## 2019-10-26 NOTE — Progress Notes (Signed)
Subjective:    Patient ID: [name], male    DOB: Jan 10, 1935, 85 y.o.   MRN: [id number] No chief complaint on file.   Patient returns today in follow up of his right below-knee amputation wound.  This has basically healed at this point.  There is only a dime size scab in the midportion of the wound with no erythema, drainage, or open portion of the wound at this point.  No fevers or chills or signs of systemic infection.  He is a very poor historian and his son provides most of the history.  Overall the patient is doing well with his right stump and regaining some of his strength.  The patient also notes that he has lost weight and so his left below-knee amputation prosthesis does not fit properly any longer.   Review of Systems  Neurological: Positive for weakness.  Psychiatric/Behavioral: Positive for confusion.  All other systems reviewed and are negative.      Objective:   Physical Exam Vitals reviewed. Exam conducted with a chaperone present (Wife and son present).  HENT:     Head: Normocephalic.  Cardiovascular:     Rate and Rhythm: Normal rate and regular rhythm.     Pulses: Normal pulses.  Pulmonary:     Effort: Pulmonary effort is normal.     Breath sounds: Normal breath sounds.  Musculoskeletal:     Right Lower Extremity: Right leg is amputated below knee.     Left Lower Extremity: Left leg is amputated below knee.  Neurological:     Mental Status: He is alert. Mental status is at baseline.  Psychiatric:        Behavior: Behavior normal.     BP (!) 167/75   Pulse (!) 48   Ht 5\' 7"  (1.702 m)   Wt 180 lb (81.6 kg)   BMI 28.19 kg/m   Past Medical History:  Diagnosis Date  . Arthritis   . Atrial fibrillation ([place])   . Carcinoma of prostate ([place])   . CHF (congestive heart failure) ([place])   . Chronic kidney disease   . Diabetes mellitus without complication ([place])   . ED (erectile dysfunction)   . Frequent urination   . Hyperlipidemia   .  Hypertension   . Moderate mitral insufficiency   . Peripheral vascular disease ([place])   . Pneumonia 04/2016  . Prostate cancer ([place])   . Sleep apnea    OSA--USE C-PAP  . Ulcer of left foot due to type 2 diabetes mellitus ([place])   . Urinary stress incontinence, male     Social History   Socioeconomic History  . Marital status: Married    Spouse name: Not on file  . Number of children: Not on file  . Years of education: Not on file  . Highest education level: Not on file  Occupational History  . Occupation: retired  Tobacco Use  . Smoking status: Former Smoker    Packs/day: 0.25    Types: Cigarettes    Quit date: 10/14/1994    Years since quitting: 25.0  . Smokeless tobacco: Never Used  Substance and Sexual Activity  . Alcohol use: No    Alcohol/week: 0.0 standard drinks  . Drug use: No  . Sexual activity: Not on file  Other Topics Concern  . Not on file  Social History Narrative   Lives at home with wife, has a wheelchair   Social Determinants of Health   Financial Resource Strain:   . Difficulty  of Paying Living Expenses:   Food Insecurity:   . Worried About Charity fundraiser in the Last Year:   . Arboriculturist in the Last Year:   Transportation Needs:   . Film/video editor (Medical):   Marland Kitchen Lack of Transportation (Non-Medical):   Physical Activity:   . Days of Exercise per Week:   . Minutes of Exercise per Session:   Stress:   . Feeling of Stress :   Social Connections:   . Frequency of Communication with Friends and Family:   . Frequency of Social Gatherings with Friends and Family:   . Attends Religious Services:   . Active Member of Clubs or Organizations:   . Attends Archivist Meetings:   Marland Kitchen Marital Status:   Intimate Partner Violence:   . Fear of Current or Ex-Partner:   . Emotionally Abused:   Mar[place] Kitchen Physically Abused:   . Sexually Abused:     Past Surgical History:  Procedure Laterality Date  . AMPUTATION Left 01/12/2017    Procedure: AMPUTATION BELOW KNEE;  Surgeon: Algernon Huxley, MD;  Location: [facility] ORS;  Service: General;  Laterality: Left;  . AMPUTATION Right 12/27/2018   Procedure: AMPUTATION BELOW KNEE;  Surgeon: Algernon Huxley, MD;  Location: [facility] ORS;  Service: General;  Laterality: Right;  . APLIGRAFT PLACEMENT Left 10/15/2016   Procedure: APLIGRAFT PLACEMENT;  Surgeon: Albertine Patricia, DPM;  Location: [facility] ORS;  Service: Podiatry;  Laterality: Left;  necrotic ulcer  . CHOLECYSTECTOMY    . COLONOSCOPY WITH PROPOFOL N/A 01/02/2019   Procedure: COLONOSCOPY WITH PROPOFOL;  Surgeon: Lucilla Lame, MD;  Location: [facility]COPY;  Service: Endoscopy;  Laterality: N/A;  . COLONOSCOPY WITH PROPOFOL N/A 01/05/2019   Procedure: COLONOSCOPY WITH PROPOFOL;  Surgeon: Lucilla Lame, MD;  Location: [facility] ENDOSCOPY;  Service: Endoscopy;  Laterality: N/A;  . ESOPHAGOGASTRODUODENOSCOPY (EGD) WITH PROPOFOL N/A 01/02/2019   Procedure: ESOPHAGOGASTRODUODENOSCOPY (EGD) WITH PROPOFOL;  Surgeon: Lucilla Lame, MD;  Location: [facility] ENDOSCOPY;  Service: Endoscopy;  Laterality: N/A;  . EYE SURGERY Bilateral    Cataract Extraction with IOL  . HIP ARTHROPLASTY Right 10/13/2018   Procedure: ARTHROPLASTY BIPOLAR HIP (HEMIARTHROPLASTY);  Surgeon: Earnestine Leys, MD;  Location: [facility] ORS;  Service: Orthopedics;  Laterality: Right;  . IRRIGATION AND DEBRIDEMENT FOOT Left 10/15/2016   Procedure: IRRIGATION AND DEBRIDEMENT FOOT-EXCISIONAL DEBRIDEMENT OF SKIN 3RD, 4TH AND 5TH TOES WITH APPLICATION OF APLIGRAFT;  Surgeon: Albertine Patricia, DPM;  Location: [facility] ORS;  Service: Podiatry;  Laterality: Left;  necrotic,gangrene  . IRRIGATION AND DEBRIDEMENT FOOT Left 12/09/2016   Procedure: IRRIGATION AND DEBRIDEMENT FOOT-MUSCLE/FASCIA, RESECTION OF FOURTH AND FIFTH METATARSAL NECROTIC BONE;  Surgeon: Albertine Patricia, DPM;  Location: [facility] ORS;  Service: Podiatry;  Laterality: Left;  . IRRIGATION AND DEBRIDEMENT FOOT Right 12/23/2018   Procedure: IRRIGATION AND  DEBRIDEMENT FOOT and wound vac application;  Surgeon: Samara Deist, DPM;  Location: [facility] ORS;  Service: Podiatry;  Laterality: Right;  . LEFT HEART CATH N/A 06/25/2019   Procedure: Left Heart Cath and Coronary Angiography;  Surgeon: Isaias Cowman, MD;  Location: [place] CV LAB;  Service: Cardiovascular;  Laterality: N/A;  . LEFT HEART CATH AND CORONARY ANGIOGRAPHY N/A 06/05/2018   Procedure: LEFT HEART CATH AND CORONARY ANGIOGRAPHY;  Surgeon: Yolonda Kida, MD;  Location: [facility] LAB;  Service: Cardiovascular;  Laterality: N/A;  . LOWER EXTREMITY ANGIOGRAPHY Left 08/16/2016   Procedure: Lower Extremity Angiography;  Surgeon: Algernon Huxley, MD;  Location: [place] CV LAB;  Service: Cardiovascular;  Laterality: Left;  . LOWER EXTREMITY ANGIOGRAPHY Left 09/01/2016   Procedure: Lower Extremity Angiography;  Surgeon: Algernon Huxley, MD;  Location: [place] [facility] LAB;  Service: Cardiovascular;  Laterality: Left;  . LOWER EXTREMITY ANGIOGRAPHY Left 10/14/2016   Procedure: Lower Extremity Angiography;  Surgeon: Algernon Huxley, MD;  Location: [place] CV LAB;  Service: Cardiovascular;  Laterality: Left;  . LOWER EXTREMITY ANGIOGRAPHY Left 12/20/2016   Procedure: Lower Extremity Angiography;  Surgeon: Algernon Huxley, MD;  Location: [place] CV LAB;  Service: Cardiovascular;  Laterality: Left;  . LOWER EXTREMITY ANGIOGRAPHY Right 12/18/2018   Procedure: LOWER EXTREMITY ANGIOGRAPHY;  Surgeon: Algernon Huxley, MD;  Location: [place] CV LAB;  Service: Cardiovascular;  Laterality: Right;  . LOWER EXTREMITY INTERVENTION  09/01/2016   Procedure: Lower Extremity Intervention;  Surgeon: Algernon Huxley, MD;  Location: [place] CV LAB;  Service: Cardiovascular;;  . PROSTATE SURGERY     removal  . TONSILLECTOMY     as a child    Family History  Problem Relation Age of Onset  . Hypertension Mother   . Diabetes Mother   . Prostate cancer Neg Hx   . Bladder Cancer Neg Hx   .  Kidney disease Neg Hx     Allergies  Allergen Reactions  . Contrast Media [Iodinated Diagnostic Agents] Shortness Of Breath    SOB  . Iohexol Shortness Of Breath     Desc: Respiratory Distress, Laryngedema, diaphoresis, Onset Date: [id number]   . Metrizamide Shortness Of Breath    SOB SOB SOB   . Latex Rash       Assessment & Plan:   1. Hx of BKA, right ([place]) Patient's right below-knee amputation has essentially healed at this point.  There is a small scab on the anterior portion of his stump.  The patient has been wearing his shrinker sock and been doing well.  At this time we will send a referral to biotech for evaluation of a below-knee amputation prosthesis.  The patient should also have gait training once he receives his prosthetic in order to prevent any immobility.  The patient will follow up with [place] in 6 to 8 weeks.  2. S/P BKA (below knee amputation) unilateral, left ([place]) Patient had a previous left lower extremity below-knee amputation.  This is completely healed however due to the fact that the patient has recently lost weight it no longer fits properly.  The patient should have a left below-knee socket placement as well as supplies due to residual limb changes.  3. Essential hypertension Continue antihypertensive medications as already ordered, these medications have been reviewed and there are no changes at this time.    Current Outpatient Medications on File Prior to Visit  Medication Sig Dispense Refill  . acetaminophen (TYLENOL) 325 MG tablet Take 650 mg by mouth every 6 (six) hours as needed.    Marland Kitchen amiodarone (PACERONE) 200 MG tablet Take 200 mg by mouth daily.    Mar[place] Kitchen aspirin EC 81 MG tablet Take 1 tablet (81 mg total) by mouth daily. 150 tablet 2  . atorvastatin (LIPITOR) 80 MG tablet Take 80 mg by mouth every evening.    . bisacodyl (DULCOLAX) 5 MG EC tablet Take 1 tablet (5 mg total) by mouth daily as needed for moderate constipation. 30 tablet 0  .  chlorthalidone (HYGROTON) 25 MG tablet Take 25 mg by mouth daily.    . Cholecalciferol (VITAMIN D-3) 25 MCG (1000 UT) CAPS Take  by mouth.    . diltiazem (CARDIZEM CD) 120 MG 24 hr capsule Take 120 mg by mouth daily.    . divalproex (DEPAKOTE ER) 500 MG 24 hr tablet Take 1 tablet (500 mg total) by mouth daily.    Marland Kitchen doxycycline (VIBRAMYCIN) 100 MG capsule Take 100 mg by mouth 2 (two) times daily.    . Ensure Max Protein (ENSURE MAX PROTEIN) LIQD Take 330 mLs (11 oz total) by mouth 2 (two) times daily.    . ferrous sulfate 325 (65 FE) MG tablet Take 1 tablet (325 mg total) by mouth daily with breakfast. 30 tablet 3  . insulin glargine (LANTUS) 100 UNIT/ML injection Inject 0.15 mLs (15 Units total) into the skin at bedtime. 1 vial 0  . insulin lispro (HUMALOG) 100 UNIT/ML injection Inject 0.03 mLs (3 Units total) into the skin 3 (three) times daily with meals. (Patient taking differently: Inject 3 Units into the skin 3 (three) times daily with meals. Plus 1 additional unit for every 50 BG points over 200 (max 5 additional units per dose)) 10 mL 0  . isosorbide mononitrate (IMDUR) 60 MG 24 hr tablet Take 1 tablet (60 mg total) by mouth daily. 30 tablet 0  . metFORMIN (GLUCOPHAGE) 500 MG tablet Take by mouth daily.    . metoprolol succinate (TOPROL-XL) 25 MG 24 hr tablet Take 25 mg by mouth daily.    . Multiple Vitamin (MULTIVITAMIN WITH MINERALS) TABS tablet Take 1 tablet by mouth daily.    . nitroGLYCERIN (NITROSTAT) 0.4 MG SL tablet Place 1 tablet (0.4 mg total) under the tongue every 5 (five) minutes as needed for chest pain. 20 tablet 0  . NOVOLOG FLEXPEN 100 UNIT/ML FlexPen     . potassium chloride (KLOR-CON) 20 MEQ packet Take by mouth 2 (two) times daily.    . Rivaroxaban (XARELTO) 15 MG TABS tablet Take 1 tablet (15 mg total) by mouth daily with supper. Hold Xarelto for 3 days.  Start on January 09, 2019. 30 tablet 1  . senna (SENOKOT) 8.6 MG TABS tablet Take 1 tablet (8.6 mg total) by mouth daily  as needed for mild constipation. 120 tablet 0  . silver sulfADIAZINE (SILVADENE) 1 % cream Apply 1 application topically daily. 50 g 0  . collagenase (SANTYL) ointment Apply 1 application topically daily. (Patient not taking: Reported on 06/24/2019) 30 g 1  . gabapentin (NEURONTIN) 300 MG capsule Take 300 mg by mouth 3 (three) times daily.     Marland Kitchen HYDROcodone-acetaminophen (NORCO/VICODIN) 5-325 MG tablet Take 1 tablet by mouth every 6 (six) hours as needed for moderate pain (pain score 4-6). (Patient not taking: Reported on 06/24/2019) 10 tablet 0  . Insulin Glargine (BASAGLAR KWIKPEN) 100 UNIT/ML SMARTSIG:15 Unit(s) SUB-Q Every Night    . metoprolol tartrate (LOPRESSOR) 25 MG tablet Take 25 mg by mouth 3 (three) times daily.    . vitamin C (VITAMIN C) 250 MG tablet Take 1 tablet (250 mg total) by mouth 2 (two) times daily. (Patient not taking: Reported on 06/24/2019)    . zinc sulfate 220 (50 Zn) MG capsule Take 220 mg by mouth daily.     Current Facility-Administered Medications on File Prior to Visit  Medication Dose Route Frequency Provider Last Rate Last Admin  . diphenhydrAMINE (BENADRYL) injection 50 mg  50 mg Intravenous Once PRN Stegmayer, Kimberly A, PA-C      . famotidine (PEPCID) tablet 40 mg  40 mg Oral Once PRN Stegmayer, Janalyn Harder, PA-C  There are no Patient Instructions on file for this visit. No follow-ups on file.   Kris Hartmann, NP

## 2019-11-30 ENCOUNTER — Other Ambulatory Visit: Payer: Self-pay

## 2019-11-30 ENCOUNTER — Emergency Department: Payer: Medicare Other

## 2019-11-30 ENCOUNTER — Inpatient Hospital Stay (HOSPITAL_COMMUNITY)
Admit: 2019-11-30 | Discharge: 2019-11-30 | Disposition: A | Discharge status: Home / Self Care | Payer: Medicare Other | Attending: Pulmonary Disease | Admitting: Pulmonary Disease

## 2019-11-30 ENCOUNTER — Inpatient Hospital Stay: Admit: 2019-11-30 | Payer: Medicare Other

## 2019-11-30 ENCOUNTER — Inpatient Hospital Stay
Admission: EM | Admit: 2019-11-30 | Discharge: 2019-12-11 | DRG: 870 | Disposition: A | Discharge status: Skilled Nursing Facility | Payer: Medicare Other | Attending: Internal Medicine | Admitting: Internal Medicine

## 2019-11-30 DIAGNOSIS — E1100 Type 2 diabetes mellitus with hyperosmolarity without nonketotic hyperglycemic-hyperosmolar coma (NKHHC): Secondary | ICD-10-CM | POA: Diagnosis present

## 2019-11-30 DIAGNOSIS — G92 Toxic encephalopathy: Secondary | ICD-10-CM | POA: Diagnosis not present

## 2019-11-30 DIAGNOSIS — Z794 Long term (current) use of insulin: Secondary | ICD-10-CM

## 2019-11-30 DIAGNOSIS — R570 Cardiogenic shock: Secondary | ICD-10-CM | POA: Diagnosis not present

## 2019-11-30 DIAGNOSIS — Z6828 Body mass index (BMI) 28.0-28.9, adult: Secondary | ICD-10-CM | POA: Diagnosis not present

## 2019-11-30 DIAGNOSIS — Z8546 Personal history of malignant neoplasm of prostate: Secondary | ICD-10-CM

## 2019-11-30 DIAGNOSIS — G4733 Obstructive sleep apnea (adult) (pediatric): Secondary | ICD-10-CM | POA: Diagnosis present

## 2019-11-30 DIAGNOSIS — A419 Sepsis, unspecified organism: Principal | ICD-10-CM | POA: Diagnosis present

## 2019-11-30 DIAGNOSIS — I5043 Acute on chronic combined systolic (congestive) and diastolic (congestive) heart failure: Secondary | ICD-10-CM | POA: Diagnosis present

## 2019-11-30 DIAGNOSIS — E1122 Type 2 diabetes mellitus with diabetic chronic kidney disease: Secondary | ICD-10-CM | POA: Diagnosis present

## 2019-11-30 DIAGNOSIS — Z833 Family history of diabetes mellitus: Secondary | ICD-10-CM

## 2019-11-30 DIAGNOSIS — I5031 Acute diastolic (congestive) heart failure: Secondary | ICD-10-CM | POA: Diagnosis not present

## 2019-11-30 DIAGNOSIS — J9621 Acute and chronic respiratory failure with hypoxia: Secondary | ICD-10-CM | POA: Diagnosis not present

## 2019-11-30 DIAGNOSIS — M1990 Unspecified osteoarthritis, unspecified site: Secondary | ICD-10-CM | POA: Diagnosis present

## 2019-11-30 DIAGNOSIS — S2243XA Multiple fractures of ribs, bilateral, initial encounter for closed fracture: Secondary | ICD-10-CM | POA: Diagnosis present

## 2019-11-30 DIAGNOSIS — I472 Ventricular tachycardia, unspecified: Secondary | ICD-10-CM

## 2019-11-30 DIAGNOSIS — Z8249 Family history of ischemic heart disease and other diseases of the circulatory system: Secondary | ICD-10-CM

## 2019-11-30 DIAGNOSIS — E876 Hypokalemia: Secondary | ICD-10-CM | POA: Diagnosis not present

## 2019-11-30 DIAGNOSIS — J9601 Acute respiratory failure with hypoxia: Secondary | ICD-10-CM | POA: Diagnosis present

## 2019-11-30 DIAGNOSIS — Z888 Allergy status to other drugs, medicaments and biological substances status: Secondary | ICD-10-CM

## 2019-11-30 DIAGNOSIS — E870 Hyperosmolality and hypernatremia: Secondary | ICD-10-CM | POA: Diagnosis present

## 2019-11-30 DIAGNOSIS — I248 Other forms of acute ischemic heart disease: Secondary | ICD-10-CM | POA: Diagnosis present

## 2019-11-30 DIAGNOSIS — R9431 Abnormal electrocardiogram [ECG] [EKG]: Secondary | ICD-10-CM | POA: Diagnosis not present

## 2019-11-30 DIAGNOSIS — E1110 Type 2 diabetes mellitus with ketoacidosis without coma: Secondary | ICD-10-CM

## 2019-11-30 DIAGNOSIS — E872 Acidosis: Secondary | ICD-10-CM | POA: Diagnosis present

## 2019-11-30 DIAGNOSIS — J9622 Acute and chronic respiratory failure with hypercapnia: Secondary | ICD-10-CM | POA: Diagnosis not present

## 2019-11-30 DIAGNOSIS — R569 Unspecified convulsions: Secondary | ICD-10-CM

## 2019-11-30 DIAGNOSIS — Z8616 Personal history of COVID-19: Secondary | ICD-10-CM

## 2019-11-30 DIAGNOSIS — Z89512 Acquired absence of left leg below knee: Secondary | ICD-10-CM

## 2019-11-30 DIAGNOSIS — Z66 Do not resuscitate: Secondary | ICD-10-CM | POA: Diagnosis not present

## 2019-11-30 DIAGNOSIS — E860 Dehydration: Secondary | ICD-10-CM | POA: Diagnosis present

## 2019-11-30 DIAGNOSIS — I252 Old myocardial infarction: Secondary | ICD-10-CM

## 2019-11-30 DIAGNOSIS — Z7189 Other specified counseling: Secondary | ICD-10-CM

## 2019-11-30 DIAGNOSIS — R54 Age-related physical debility: Secondary | ICD-10-CM | POA: Diagnosis present

## 2019-11-30 DIAGNOSIS — I130 Hypertensive heart and chronic kidney disease with heart failure and stage 1 through stage 4 chronic kidney disease, or unspecified chronic kidney disease: Secondary | ICD-10-CM | POA: Diagnosis present

## 2019-11-30 DIAGNOSIS — J9602 Acute respiratory failure with hypercapnia: Secondary | ICD-10-CM | POA: Diagnosis present

## 2019-11-30 DIAGNOSIS — Z8701 Personal history of pneumonia (recurrent): Secondary | ICD-10-CM

## 2019-11-30 DIAGNOSIS — R531 Weakness: Secondary | ICD-10-CM | POA: Diagnosis not present

## 2019-11-30 DIAGNOSIS — Z7901 Long term (current) use of anticoagulants: Secondary | ICD-10-CM

## 2019-11-30 DIAGNOSIS — N189 Chronic kidney disease, unspecified: Secondary | ICD-10-CM | POA: Diagnosis present

## 2019-11-30 DIAGNOSIS — Z79899 Other long term (current) drug therapy: Secondary | ICD-10-CM

## 2019-11-30 DIAGNOSIS — Z7982 Long term (current) use of aspirin: Secondary | ICD-10-CM

## 2019-11-30 DIAGNOSIS — I4891 Unspecified atrial fibrillation: Secondary | ICD-10-CM

## 2019-11-30 DIAGNOSIS — Z91041 Radiographic dye allergy status: Secondary | ICD-10-CM

## 2019-11-30 DIAGNOSIS — I482 Chronic atrial fibrillation, unspecified: Secondary | ICD-10-CM | POA: Diagnosis present

## 2019-11-30 DIAGNOSIS — R6521 Severe sepsis with septic shock: Secondary | ICD-10-CM | POA: Diagnosis not present

## 2019-11-30 DIAGNOSIS — Z87891 Personal history of nicotine dependence: Secondary | ICD-10-CM

## 2019-11-30 DIAGNOSIS — E785 Hyperlipidemia, unspecified: Secondary | ICD-10-CM | POA: Diagnosis present

## 2019-11-30 DIAGNOSIS — R55 Syncope and collapse: Secondary | ICD-10-CM

## 2019-11-30 DIAGNOSIS — Z515 Encounter for palliative care: Secondary | ICD-10-CM | POA: Diagnosis not present

## 2019-11-30 DIAGNOSIS — Z91040 Latex allergy status: Secondary | ICD-10-CM

## 2019-11-30 DIAGNOSIS — Z20822 Contact with and (suspected) exposure to covid-19: Secondary | ICD-10-CM | POA: Diagnosis present

## 2019-11-30 DIAGNOSIS — I2510 Atherosclerotic heart disease of native coronary artery without angina pectoris: Secondary | ICD-10-CM | POA: Diagnosis present

## 2019-11-30 DIAGNOSIS — G40909 Epilepsy, unspecified, not intractable, without status epilepticus: Secondary | ICD-10-CM

## 2019-11-30 DIAGNOSIS — E119 Type 2 diabetes mellitus without complications: Secondary | ICD-10-CM

## 2019-11-30 DIAGNOSIS — J690 Pneumonitis due to inhalation of food and vomit: Secondary | ICD-10-CM

## 2019-11-30 DIAGNOSIS — Z89511 Acquired absence of right leg below knee: Secondary | ICD-10-CM

## 2019-11-30 DIAGNOSIS — N179 Acute kidney failure, unspecified: Secondary | ICD-10-CM | POA: Diagnosis present

## 2019-11-30 DIAGNOSIS — J9600 Acute respiratory failure, unspecified whether with hypoxia or hypercapnia: Secondary | ICD-10-CM | POA: Diagnosis not present

## 2019-11-30 DIAGNOSIS — E1151 Type 2 diabetes mellitus with diabetic peripheral angiopathy without gangrene: Secondary | ICD-10-CM | POA: Diagnosis present

## 2019-11-30 LAB — BASIC METABOLIC PANEL
Anion gap: 16 — ABNORMAL HIGH (ref 5–15)
Anion gap: 14 (ref 5–15)
Anion gap: 15 (ref 5–15)
Anion gap: 8 (ref 5–15)
BUN: 28 mg/dL — ABNORMAL HIGH (ref 8–23)
BUN: 29 mg/dL — ABNORMAL HIGH (ref 8–23)
BUN: 30 mg/dL — ABNORMAL HIGH (ref 8–23)
BUN: 32 mg/dL — ABNORMAL HIGH (ref 8–23)
CO2: 22 mmol/L (ref 22–32)
CO2: 24 mmol/L (ref 22–32)
CO2: 24 mmol/L (ref 22–32)
CO2: 25 mmol/L (ref 22–32)
Calcium: 10.5 mg/dL — ABNORMAL HIGH (ref 8.9–10.3)
Calcium: 11.9 mg/dL — ABNORMAL HIGH (ref 8.9–10.3)
Calcium: 11 mg/dL — ABNORMAL HIGH (ref 8.9–10.3)
Calcium: 10.2 mg/dL (ref 8.9–10.3)
Chloride: 99 mmol/L (ref 98–111)
Chloride: 103 mmol/L (ref 98–111)
Chloride: 102 mmol/L (ref 98–111)
Chloride: 107 mmol/L (ref 98–111)
Creatinine, Ser: 1.72 mg/dL — ABNORMAL HIGH (ref 0.61–1.24)
Creatinine, Ser: 1.9 mg/dL — ABNORMAL HIGH (ref 0.61–1.24)
Creatinine, Ser: 1.8 mg/dL — ABNORMAL HIGH (ref 0.61–1.24)
Creatinine, Ser: 1.61 mg/dL — ABNORMAL HIGH (ref 0.61–1.24)
GFR calc Af Amer: 42 mL/min — ABNORMAL LOW (ref >60)
GFR calc Af Amer: 37 mL/min — ABNORMAL LOW (ref >60)
GFR calc Af Amer: 40 mL/min — ABNORMAL LOW (ref >60)
GFR calc Af Amer: 45 mL/min — ABNORMAL LOW (ref >60)
GFR calc non Af Amer: 36 mL/min — ABNORMAL LOW (ref >60)
GFR calc non Af Amer: 32 mL/min — ABNORMAL LOW (ref >60)
GFR calc non Af Amer: 34 mL/min — ABNORMAL LOW (ref >60)
GFR calc non Af Amer: 39 mL/min — ABNORMAL LOW (ref >60)
Glucose, Bld: 679 mg/dL (ref 70–99)
Glucose, Bld: 559 mg/dL (ref 70–99)
Glucose, Bld: 448 mg/dL — ABNORMAL HIGH (ref 70–99)
Glucose, Bld: 222 mg/dL — ABNORMAL HIGH (ref 70–99)
Potassium: 4 mmol/L (ref 3.5–5.1)
Potassium: 3.7 mmol/L (ref 3.5–5.1)
Potassium: 4 mmol/L (ref 3.5–5.1)
Potassium: 4.4 mmol/L (ref 3.5–5.1)
Sodium: 137 mmol/L (ref 135–145)
Sodium: 141 mmol/L (ref 135–145)
Sodium: 141 mmol/L (ref 135–145)
Sodium: 140 mmol/L (ref 135–145)

## 2019-11-30 LAB — GLUCOSE, CAPILLARY
Glucose-Capillary: >600 mg/dL (ref 70–99)
Glucose-Capillary: >600 mg/dL (ref 70–99)
Glucose-Capillary: >600 mg/dL (ref 70–99)
Glucose-Capillary: >600 mg/dL (ref 70–99)
Glucose-Capillary: 532 mg/dL (ref 70–99)
Glucose-Capillary: 537 mg/dL (ref 70–99)
Glucose-Capillary: 487 mg/dL — ABNORMAL HIGH (ref 70–99)
Glucose-Capillary: 404 mg/dL — ABNORMAL HIGH (ref 70–99)
Glucose-Capillary: 427 mg/dL — ABNORMAL HIGH (ref 70–99)
Glucose-Capillary: 350 mg/dL — ABNORMAL HIGH (ref 70–99)
Glucose-Capillary: 397 mg/dL — ABNORMAL HIGH (ref 70–99)
Glucose-Capillary: 374 mg/dL — ABNORMAL HIGH (ref 70–99)
Glucose-Capillary: 296 mg/dL — ABNORMAL HIGH (ref 70–99)
Glucose-Capillary: 227 mg/dL — ABNORMAL HIGH (ref 70–99)
Glucose-Capillary: 207 mg/dL — ABNORMAL HIGH (ref 70–99)
Glucose-Capillary: 168 mg/dL — ABNORMAL HIGH (ref 70–99)
Glucose-Capillary: 163 mg/dL — ABNORMAL HIGH (ref 70–99)
Glucose-Capillary: 179 mg/dL — ABNORMAL HIGH (ref 70–99)
Glucose-Capillary: 154 mg/dL — ABNORMAL HIGH (ref 70–99)
Glucose-Capillary: 131 mg/dL — ABNORMAL HIGH (ref 70–99)
Glucose-Capillary: 132 mg/dL — ABNORMAL HIGH (ref 70–99)
Glucose-Capillary: 190 mg/dL — ABNORMAL HIGH (ref 70–99)
Glucose-Capillary: 149 mg/dL — ABNORMAL HIGH (ref 70–99)
Glucose-Capillary: 172 mg/dL — ABNORMAL HIGH (ref 70–99)

## 2019-11-30 LAB — BLOOD GAS, VENOUS
Acid-base deficit: 2.7 mmol/L — ABNORMAL HIGH (ref 0.0–2.0)
Bicarbonate: 24.2 mmol/L (ref 20.0–28.0)
O2 Saturation: 72.1 %
Patient temperature: 37
pCO2, Ven: 48 mmHg (ref 44.0–60.0)
pH, Ven: 7.31 (ref 7.250–7.430)
pO2, Ven: 42 mmHg (ref 32.0–45.0)

## 2019-11-30 LAB — HEPATIC FUNCTION PANEL
ALT: 28 U/L (ref 0–44)
AST: 43 U/L — ABNORMAL HIGH (ref 15–41)
Albumin: 3.5 g/dL (ref 3.5–5.0)
Alkaline Phosphatase: 172 U/L — ABNORMAL HIGH (ref 38–126)
Bilirubin, Direct: 0.5 mg/dL — ABNORMAL HIGH (ref 0.0–0.2)
Indirect Bilirubin: 0.8 mg/dL (ref 0.3–0.9)
Total Bilirubin: 1.3 mg/dL — ABNORMAL HIGH (ref 0.3–1.2)
Total Protein: 7.9 g/dL (ref 6.5–8.1)

## 2019-11-30 LAB — CBC WITH DIFFERENTIAL/PLATELET
Abs Immature Granulocytes: 0.27 10*3/uL — ABNORMAL HIGH (ref 0.00–0.07)
Basophils Absolute: 0.2 10*3/uL — ABNORMAL HIGH (ref 0.0–0.1)
Basophils Relative: 1 %
Eosinophils Absolute: 0.5 10*3/uL (ref 0.0–0.5)
Eosinophils Relative: 2 %
HCT: 52.3 % — ABNORMAL HIGH (ref 39.0–52.0)
Hemoglobin: 17.6 g/dL — ABNORMAL HIGH (ref 13.0–17.0)
Immature Granulocytes: 1 %
Lymphocytes Relative: 20 %
Lymphs Abs: 4.4 10*3/uL — ABNORMAL HIGH (ref 0.7–4.0)
MCH: 29.2 pg (ref 26.0–34.0)
MCHC: 33.7 g/dL (ref 30.0–36.0)
MCV: 86.9 fL (ref 80.0–100.0)
Monocytes Absolute: 1.4 10*3/uL — ABNORMAL HIGH (ref 0.1–1.0)
Monocytes Relative: 6 %
Neutro Abs: 15.1 10*3/uL — ABNORMAL HIGH (ref 1.7–7.7)
Neutrophils Relative %: 70 %
Platelets: 241 10*3/uL (ref 150–400)
RBC: 6.02 MIL/uL — ABNORMAL HIGH (ref 4.22–5.81)
RDW: 13.5 % (ref 11.5–15.5)
WBC: 21.8 10*3/uL — ABNORMAL HIGH (ref 4.0–10.5)
nRBC: 0 % (ref 0.0–0.2)

## 2019-11-30 LAB — BLOOD GAS, ARTERIAL
Acid-base deficit: 8.2 mmol/L — ABNORMAL HIGH (ref 0.0–2.0)
Bicarbonate: 21.7 mmol/L (ref 20.0–28.0)
FIO2: 1
MECHVT: 425 mL
Mechanical Rate: 18
O2 Saturation: 86.5 %
PEEP: 10 cmH2O
Patient temperature: 37
RATE: 18 resp/min
pCO2 arterial: 61 mmHg — ABNORMAL HIGH (ref 32.0–48.0)
pH, Arterial: 7.16 — CL (ref 7.350–7.450)
pO2, Arterial: 67 mmHg — ABNORMAL LOW (ref 83.0–108.0)

## 2019-11-30 LAB — OSMOLALITY: Osmolality: 332 mOsm/kg (ref 275–295)

## 2019-11-30 LAB — BRAIN NATRIURETIC PEPTIDE: B Natriuretic Peptide: 390.9 pg/mL — ABNORMAL HIGH (ref 0.0–100.0)

## 2019-11-30 LAB — LACTIC ACID, PLASMA
Lactic Acid, Venous: 7.1 mmol/L (ref 0.5–1.9)
Lactic Acid, Venous: 5.7 mmol/L (ref 0.5–1.9)

## 2019-11-30 LAB — URINALYSIS, COMPLETE (UACMP) WITH MICROSCOPIC
Bacteria, UA: NONE SEEN
Bilirubin Urine: NEGATIVE
Glucose, UA: >=500 mg/dL — AB
Hgb urine dipstick: NEGATIVE
Ketones, ur: NEGATIVE mg/dL
Leukocytes,Ua: NEGATIVE
Nitrite: NEGATIVE
Protein, ur: 100 mg/dL — AB
Specific Gravity, Urine: 1.025 (ref 1.005–1.030)
Squamous Epithelial / HPF: NONE SEEN (ref 0–5)
pH: 5 (ref 5.0–8.0)

## 2019-11-30 LAB — BETA-HYDROXYBUTYRIC ACID: Beta-Hydroxybutyric Acid: 0.12 mmol/L (ref 0.05–0.27)

## 2019-11-30 LAB — CHOLESTEROL, TOTAL: Cholesterol: 253 mg/dL — ABNORMAL HIGH (ref 0–200)

## 2019-11-30 LAB — MAGNESIUM: Magnesium: 2 mg/dL (ref 1.7–2.4)

## 2019-11-30 LAB — URINE DRUG SCREEN, QUALITATIVE (ARMC ONLY)
Amphetamines, Ur Screen: NOT DETECTED
Barbiturates, Ur Screen: NOT DETECTED
Benzodiazepine, Ur Scrn: POSITIVE — AB
Cannabinoid 50 Ng, Ur ~~LOC~~: NOT DETECTED
Cocaine Metabolite,Ur ~~LOC~~: NOT DETECTED
MDMA (Ecstasy)Ur Screen: NOT DETECTED
Methadone Scn, Ur: NOT DETECTED
Opiate, Ur Screen: NOT DETECTED
Phencyclidine (PCP) Ur S: NOT DETECTED
Tricyclic, Ur Screen: NOT DETECTED

## 2019-11-30 LAB — TROPONIN I (HIGH SENSITIVITY)
Troponin I (High Sensitivity): 39 ng/L — ABNORMAL HIGH (ref <18)
Troponin I (High Sensitivity): 123 ng/L (ref <18)
Troponin I (High Sensitivity): 337 ng/L (ref <18)
Troponin I (High Sensitivity): 717 ng/L (ref <18)

## 2019-11-30 LAB — PROCALCITONIN: Procalcitonin: 0.2 ng/mL

## 2019-11-30 LAB — ECHOCARDIOGRAM COMPLETE
Height: 67 in
Weight: 2871.27 oz

## 2019-11-30 LAB — APTT: aPTT: 25 seconds (ref 24–36)

## 2019-11-30 LAB — MRSA PCR SCREENING: MRSA by PCR: NEGATIVE

## 2019-11-30 LAB — TRIGLYCERIDES: Triglycerides: 174 mg/dL — ABNORMAL HIGH (ref <150)

## 2019-11-30 LAB — PROTIME-INR
INR: 1.1 (ref 0.8–1.2)
Prothrombin Time: 13.9 seconds (ref 11.4–15.2)

## 2019-11-30 LAB — HEMOGLOBIN A1C
Hgb A1c MFr Bld: 11.8 % — ABNORMAL HIGH (ref 4.8–5.6)
Mean Plasma Glucose: 291.96 mg/dL

## 2019-11-30 LAB — SARS CORONAVIRUS 2 BY RT PCR (HOSPITAL ORDER, PERFORMED IN ~~LOC~~ HOSPITAL LAB): SARS Coronavirus 2: NEGATIVE

## 2019-11-30 MED ORDER — ETOMIDATE 2 MG/ML IV SOLN
INTRAVENOUS | Status: AC | PRN
  Administered 2019-11-30: 20 mg via INTRAVENOUS

## 2019-11-30 MED ORDER — INSULIN REGULAR(HUMAN) IN NACL 100-0.9 UT/100ML-% IV SOLN
INTRAVENOUS | Status: AC
  Administered 2019-11-30 (×2): 5 [IU]/h via INTRAVENOUS
  Filled 2019-11-30: qty 100

## 2019-11-30 MED ORDER — DEXTROSE-NACL 5-0.45 % IV SOLN: INTRAVENOUS | Status: DC

## 2019-11-30 MED ORDER — MAGNESIUM SULFATE 50 % IJ SOLN
INTRAMUSCULAR | Status: AC | PRN
  Administered 2019-11-30: 2 g via INTRAVENOUS

## 2019-11-30 MED ORDER — ASPIRIN 300 MG RE SUPP: 300.0000 mg | RECTAL | Status: AC

## 2019-11-30 MED ORDER — ROCURONIUM BROMIDE 50 MG/5ML IV SOLN
INTRAVENOUS | Status: AC | PRN
  Administered 2019-11-30: 80 mg via INTRAVENOUS

## 2019-11-30 MED ORDER — FREE WATER
200.0000 mL | Status: DC
  Administered 2019-11-30 – 2019-12-06 (×37): 200 mL

## 2019-11-30 MED ORDER — HEPARIN (PORCINE) 25000 UT/250ML-% IV SOLN
1000.0000 [IU]/h | INTRAVENOUS | Status: DC
  Administered 2019-11-30: 1000 [IU]/h via INTRAVENOUS

## 2019-11-30 MED ORDER — SODIUM CHLORIDE 0.9 % IV SOLN
3.0000 g | Freq: Three times a day (TID) | INTRAVENOUS | Status: DC
  Filled 2019-11-30 (×2): qty 8

## 2019-11-30 MED ORDER — PROPOFOL 1000 MG/100ML IV EMUL
5.0000 ug/kg/min | INTRAVENOUS | Status: DC
  Administered 2019-11-30: 20 ug/kg/min via INTRAVENOUS
  Administered 2019-11-30: 30 ug/kg/min via INTRAVENOUS
  Administered 2019-11-30: 25 ug/kg/min via INTRAVENOUS
  Administered 2019-11-30: 30 ug/kg/min via INTRAVENOUS
  Administered 2019-12-01: 25 ug/kg/min via INTRAVENOUS
  Administered 2019-12-01: 30 ug/kg/min via INTRAVENOUS
  Administered 2019-12-02: 25 ug/kg/min via INTRAVENOUS
  Administered 2019-12-02: 20.653 ug/kg/min via INTRAVENOUS
  Administered 2019-12-02: 30 ug/kg/min via INTRAVENOUS
  Administered 2019-12-03: 12 ug/kg/min via INTRAVENOUS
  Administered 2019-12-04: 10 ug/kg/min via INTRAVENOUS
  Administered 2019-12-05: 35 ug/kg/min via INTRAVENOUS
  Administered 2019-12-05: 60 ug/kg/min via INTRAVENOUS
  Administered 2019-12-05: 10 ug/kg/min via INTRAVENOUS
  Filled 2019-11-30 (×17): qty 100

## 2019-11-30 MED ORDER — LACTATED RINGERS IV BOLUS
250.0000 mL | INTRAVENOUS | Status: AC
  Administered 2019-11-30: 250 mL via INTRAVENOUS

## 2019-11-30 MED ORDER — LEVETIRACETAM IN NACL 500 MG/100ML IV SOLN
500.0000 mg | Freq: Two times a day (BID) | INTRAVENOUS | Status: DC
  Administered 2019-11-30 – 2019-12-11 (×23): 500 mg via INTRAVENOUS
  Filled 2019-11-30 (×30): qty 100

## 2019-11-30 MED ORDER — DOCUSATE SODIUM 50 MG/5ML PO LIQD
100.0000 mg | Freq: Two times a day (BID) | ORAL | Status: DC
  Administered 2019-11-30 – 2019-12-10 (×16): 100 mg via ORAL
  Filled 2019-11-30 (×24): qty 10

## 2019-11-30 MED ORDER — SODIUM CHLORIDE 0.9 % IV SOLN
3.0000 g | Freq: Four times a day (QID) | INTRAVENOUS | Status: DC
  Administered 2019-11-30 – 2019-12-05 (×19): 3 g via INTRAVENOUS
  Filled 2019-11-30: qty 8
  Filled 2019-11-30: qty 3
  Filled 2019-11-30 (×2): qty 8
  Filled 2019-11-30 (×4): qty 3
  Filled 2019-11-30: qty 8
  Filled 2019-11-30 (×5): qty 3
  Filled 2019-11-30: qty 8
  Filled 2019-11-30: qty 3
  Filled 2019-11-30: qty 8
  Filled 2019-11-30: qty 3
  Filled 2019-11-30: qty 8
  Filled 2019-11-30 (×4): qty 3
  Filled 2019-11-30: qty 8

## 2019-11-30 MED ORDER — SODIUM CHLORIDE 0.9 % IV SOLN: INTRAVENOUS | Status: DC

## 2019-11-30 MED ORDER — DEXTROSE 5 % IV SOLN
INTRAVENOUS | Status: AC | PRN
  Administered 2019-11-30: 150 mg via INTRAVENOUS

## 2019-11-30 MED ORDER — FENTANYL CITRATE (PF) 100 MCG/2ML IJ SOLN
25.0000 ug | INTRAMUSCULAR | Status: DC | PRN
  Administered 2019-12-02: 100 ug via INTRAVENOUS
  Administered 2019-12-03: 50 ug via INTRAVENOUS
  Administered 2019-12-04 (×2): 100 ug via INTRAVENOUS
  Administered 2019-12-05 – 2019-12-06 (×2): 50 ug via INTRAVENOUS
  Filled 2019-11-30 (×6): qty 2

## 2019-11-30 MED ORDER — INSULIN GLARGINE 100 UNIT/ML ~~LOC~~ SOLN
12.0000 [IU] | Freq: Every day | SUBCUTANEOUS | Status: DC
  Administered 2019-11-30 – 2019-12-05 (×6): 12 [IU] via SUBCUTANEOUS
  Administered 2019-12-06: 6 [IU] via SUBCUTANEOUS
  Administered 2019-12-08 – 2019-12-10 (×4): 12 [IU] via SUBCUTANEOUS
  Filled 2019-11-30 (×13): qty 0.12

## 2019-11-30 MED ORDER — DOCUSATE SODIUM 100 MG PO CAPS: 100.0000 mg | ORAL_CAPSULE | Freq: Two times a day (BID) | ORAL | Status: DC | PRN

## 2019-11-30 MED ORDER — POLYETHYLENE GLYCOL 3350 17 G PO PACK: 17.0000 g | PACK | Freq: Every day | ORAL | Status: DC | PRN

## 2019-11-30 MED ORDER — POTASSIUM CHLORIDE 10 MEQ/100ML IV SOLN
10.0000 meq | INTRAVENOUS | Status: AC
  Administered 2019-11-30 (×2): 10 meq via INTRAVENOUS
  Filled 2019-11-30 (×3): qty 100

## 2019-11-30 MED ORDER — DEXTROSE 50 % IV SOLN: 0.0000 mL | INTRAVENOUS | Status: DC | PRN

## 2019-11-30 MED ORDER — MIDAZOLAM HCL 2 MG/2ML IJ SOLN
1.0000 mg | INTRAMUSCULAR | Status: DC | PRN
  Administered 2019-12-02: 1 mg via INTRAVENOUS
  Filled 2019-11-30: qty 2

## 2019-11-30 MED ORDER — METOPROLOL TARTRATE 5 MG/5ML IV SOLN
5.0000 mg | Freq: Once | INTRAVENOUS | Status: AC
  Administered 2019-11-30: 5 mg via INTRAVENOUS
  Filled 2019-11-30: qty 5

## 2019-11-30 MED ORDER — HEPARIN BOLUS VIA INFUSION
2000.0000 [IU] | Freq: Once | INTRAVENOUS | Status: AC
  Administered 2019-11-30: 2000 [IU] via INTRAVENOUS
  Filled 2019-11-30: qty 2000

## 2019-11-30 MED ORDER — FENTANYL CITRATE (PF) 100 MCG/2ML IJ SOLN: 25.0000 ug | INTRAMUSCULAR | Status: DC | PRN

## 2019-11-30 MED ORDER — CALCIUM CHLORIDE 10 % IV SOLN
INTRAVENOUS | Status: AC | PRN
  Administered 2019-11-30: 1 g via INTRAVENOUS

## 2019-11-30 MED ORDER — AMIODARONE HCL IN DEXTROSE 360-4.14 MG/200ML-% IV SOLN
60.0000 mg/h | INTRAVENOUS | Status: DC
  Administered 2019-11-30: 60 mg/h via INTRAVENOUS
  Filled 2019-11-30: qty 200

## 2019-11-30 MED ORDER — CHLORHEXIDINE GLUCONATE CLOTH 2 % EX PADS
6.0000 | MEDICATED_PAD | Freq: Every day | CUTANEOUS | Status: DC
  Administered 2019-11-30 – 2019-12-05 (×6): 6 via TOPICAL
  Filled 2019-11-30: qty 6

## 2019-11-30 MED ORDER — RIVAROXABAN 15 MG PO TABS
15.0000 mg | ORAL_TABLET | Freq: Every day | ORAL | Status: DC
  Administered 2019-11-30 – 2019-12-10 (×10): 15 mg via ORAL
  Filled 2019-11-30 (×12): qty 1

## 2019-11-30 MED ORDER — INSULIN ASPART 100 UNIT/ML ~~LOC~~ SOLN
0.0000 [IU] | SUBCUTANEOUS | Status: DC
  Administered 2019-11-30 (×2): 3 [IU] via SUBCUTANEOUS
  Administered 2019-11-30: 2 [IU] via SUBCUTANEOUS
  Administered 2019-12-01 (×2): 3 [IU] via SUBCUTANEOUS
  Administered 2019-12-01: 2 [IU] via SUBCUTANEOUS
  Administered 2019-12-01: 3 [IU] via SUBCUTANEOUS
  Administered 2019-12-01: 2 [IU] via SUBCUTANEOUS
  Administered 2019-12-02: 3 [IU] via SUBCUTANEOUS
  Administered 2019-12-02: 2 [IU] via SUBCUTANEOUS
  Administered 2019-12-02: 3 [IU] via SUBCUTANEOUS
  Administered 2019-12-03 – 2019-12-04 (×6): 2 [IU] via SUBCUTANEOUS
  Administered 2019-12-04 (×3): 3 [IU] via SUBCUTANEOUS
  Administered 2019-12-04: 5 [IU] via SUBCUTANEOUS
  Administered 2019-12-06 (×2): 3 [IU] via SUBCUTANEOUS
  Administered 2019-12-06 (×2): 2 [IU] via SUBCUTANEOUS
  Administered 2019-12-06 (×2): 3 [IU] via SUBCUTANEOUS
  Administered 2019-12-06 – 2019-12-07 (×3): 2 [IU] via SUBCUTANEOUS
  Administered 2019-12-08: 3 [IU] via SUBCUTANEOUS
  Administered 2019-12-08: 2 [IU] via SUBCUTANEOUS
  Administered 2019-12-08: 8 [IU] via SUBCUTANEOUS
  Administered 2019-12-09: 12:00:00 5 [IU] via SUBCUTANEOUS
  Administered 2019-12-09: 21:00:00 3 [IU] via SUBCUTANEOUS
  Administered 2019-12-09: 5 [IU] via SUBCUTANEOUS
  Administered 2019-12-10: 2 [IU] via SUBCUTANEOUS
  Administered 2019-12-10: 18:00:00 3 [IU] via SUBCUTANEOUS
  Administered 2019-12-10 – 2019-12-11 (×3): 2 [IU] via SUBCUTANEOUS
  Filled 2019-11-30 (×39): qty 1

## 2019-11-30 MED ORDER — ALBUMIN HUMAN 25 % IV SOLN
12.5000 g | Freq: Every day | INTRAVENOUS | Status: DC
  Administered 2019-11-30 – 2019-12-06 (×6): 12.5 g via INTRAVENOUS
  Filled 2019-11-30 (×6): qty 50

## 2019-11-30 MED ORDER — CHLORHEXIDINE GLUCONATE 0.12% ORAL RINSE (MEDLINE KIT)
15.0000 mL | Freq: Two times a day (BID) | OROMUCOSAL | Status: DC
  Administered 2019-11-30 – 2019-12-06 (×13): 15 mL via OROMUCOSAL

## 2019-11-30 MED ORDER — PROPOFOL 1000 MG/100ML IV EMUL
INTRAVENOUS | Status: AC | PRN
  Administered 2019-11-30: 20 ug/kg/min via INTRAVENOUS

## 2019-11-30 MED ORDER — DEXTROSE-NACL 5-0.9 % IV SOLN: INTRAVENOUS | Status: DC

## 2019-11-30 MED ORDER — IPRATROPIUM-ALBUTEROL 0.5-2.5 (3) MG/3ML IN SOLN: 3.0000 mL | RESPIRATORY_TRACT | Status: DC | PRN

## 2019-11-30 MED ORDER — ORAL CARE MOUTH RINSE
15.0000 mL | OROMUCOSAL | Status: DC
  Administered 2019-11-30 – 2019-12-06 (×57): 15 mL via OROMUCOSAL

## 2019-11-30 MED ORDER — POLYETHYLENE GLYCOL 3350 17 G PO PACK
17.0000 g | PACK | Freq: Every day | ORAL | Status: DC
  Administered 2019-11-30 – 2019-12-10 (×7): 17 g via ORAL
  Filled 2019-11-30 (×9): qty 1

## 2019-11-30 MED ORDER — INSULIN ASPART 100 UNIT/ML ~~LOC~~ SOLN
SUBCUTANEOUS | Status: AC
  Filled 2019-11-30: qty 1

## 2019-11-30 MED ORDER — FAMOTIDINE IN NACL 20-0.9 MG/50ML-% IV SOLN
20.0000 mg | Freq: Two times a day (BID) | INTRAVENOUS | Status: DC
  Administered 2019-11-30 – 2019-12-11 (×23): 20 mg via INTRAVENOUS
  Filled 2019-11-30 (×22): qty 50

## 2019-11-30 MED ORDER — MIDAZOLAM HCL 2 MG/2ML IJ SOLN
1.0000 mg | INTRAMUSCULAR | Status: DC | PRN
  Filled 2019-11-30: qty 2

## 2019-11-30 MED ORDER — HEPARIN (PORCINE) 25000 UT/250ML-% IV SOLN
INTRAVENOUS | Status: AC
  Administered 2019-11-30: 1000 [IU]/h
  Filled 2019-11-30: qty 250

## 2019-11-30 MED ORDER — SODIUM CHLORIDE 0.9 % IV SOLN
3.0000 g | Freq: Once | INTRAVENOUS | Status: AC
  Administered 2019-11-30: 3 g via INTRAVENOUS
  Filled 2019-11-30: qty 8

## 2019-11-30 MED ORDER — AMIODARONE HCL IN DEXTROSE 360-4.14 MG/200ML-% IV SOLN: 30.0000 mg/h | INTRAVENOUS | Status: DC

## 2019-11-30 MED ORDER — ASPIRIN 81 MG PO CHEW
324.0000 mg | CHEWABLE_TABLET | ORAL | Status: AC
  Administered 2019-11-30: 324 mg via ORAL
  Filled 2019-11-30: qty 4

## 2019-11-30 MED ORDER — VASOPRESSIN 20 UNITS/100 ML INFUSION FOR SHOCK
0.0000 [IU]/min | INTRAVENOUS | Status: DC
  Filled 2019-11-30: qty 100

## 2019-11-30 MED FILL — Medication: Qty: 1 | Status: AC

## 2019-11-30 NOTE — Consult Note (Signed)
Reason for Consult:Seizure Requesting Physician: Lanney Gins  CC: Found down  I have been asked by Dr. Lanney Gins to see this patient in consultation for seizure.  HPI: [name] is an 85 y.o. male who is intubated and sedated and unable to provide any history therefore al history obtained from the chart.  Patient with a history of A. fib on Xarelto, diastolic CHF, CKD, diabetes, bilateral BKA's, peripheral vascular disease, sleep apnea who presents for hyperglycemia and syncope.  According to EMS patient had a syncopal event.  When EMS arrived patient was found facedown on the floor, confused.  Patient had pulseand was breathing. CBG elevated. Patient was combative requiring 60m of IV versed. Patient then went into Vtach and received 153mof amiodarone prior to arrival.  On arrival to the emergency room patient was agitated, not answering any questions, with increased work of breathing, gurgling sounds, moving all extremities. Had witnessed seizure receiving 21m10mf Ativan.  Currently intubated and sedated.  Past Medical History:  Diagnosis Date  . Arthritis   . [facility]rial [place] ([facility] . Carcinoma of prostate ([place] . CHF (congestive heart failure) ([place]CC[place] . Chronic kidney disease   . Diabetes mellitus without complication ([place]CC[place] . ED (erectile dysfunction)   . Frequent urination   . Hyperlipidemia   . Hypertension   . Moderate mitral insufficiency   . Peripheral vascular disease ([place] . Pneumonia 04/2016  . [place]state cancer ([place] . Sleep apnea    OSA--USE C-PAP  . Ulcer of left foot due to type 2 diabetes mellitus ([place]CC[place] . Urinary stress incontinence, male     Past Surgical History:  Procedure Laterality Date  . AMPUTATION Left 01/12/2017   Procedure: AMPUTATION BELOW KNEE;  Surgeon: DewAlgernon HuxleyD;  Location: [facility];  Service: General;  Laterality: Left;  . AMPUTATION Right 12/27/2018   Procedure: AMPUTATION BELOW KNEE;  Surgeon: DewAlgernon HuxleyD;   Location: [facility];  Service: General;  Laterality: Right;  . APLIGRAFT PLACEMENT Left 10/15/2016   Procedure: APLIGRAFT PLACEMENT;  Surgeon: TroAlbertine PatriciaPM;  Location: [facility];  Service: Podiatry;  Laterality: Left;  necrotic ulcer  . CHOLECYSTECTOMY    . COLONOSCOPY WITH PROPOFOL N/A 01/02/2019   Procedure: COLONOSCOPY WITH PROPOFOL;  Surgeon: WohLucilla LameD;  [facility];  Service: Endoscopy;  Laterality: N/A;  . COLONOSCOPY WITH PROPOFOL N/A 01/05/2019   Procedure: COLONOSCOPY WITH PROPOFOL;  Surgeon: WohLucilla LameD;  [facility];  Service: Endoscopy;  Laterality: N/A;  . ESOPHAGOGASTRODUODENOSCOPY (EGD) WITH PROPOFOL N/A 01/02/2019   Procedure: ESOPHAGOGASTRODUODENOSCOPY (EGD) WITH PROPOFOL;  Surgeon: WohLucilla LameD;  Location: [facility];  Service: Endoscopy;  Laterality: N/A;  . EYE SURGERY Bilateral    Cataract Extraction with IOL  . HIP ARTHROPLASTY Right 10/13/2018   Procedure: ARTHROPLASTY BIPOLAR HIP (HEMIARTHROPLASTY);  Surgeon: MilEarnestine LeysD;  Location: [facility];  Service: Orthopedics;  Laterality: Right;  . IRRIGATION AND DEBRIDEMENT FOOT Left 10/15/2016   Procedure: IRRIGATION AND DEBRIDEMENT FOOT-EXCISIONAL DEBRIDEMENT OF SKIN 3RD, 4TH AND 5TH TOES WITH APPLICATION OF APLIGRAFT;  Surgeon: TroAlbertine PatriciaPM;  Location: [facility];  Service: Podiatry;  Laterality: Left;  necrotic,gangrene  . IRRIGATION AND DEBRIDEMENT FOOT Left 12/09/2016   Procedure: IRRIGATION AND DEBRIDEMENT FOOT-MUSCLE/FASCIA, RESECTION OF FOURTH AND FIFTH METATARSAL NECROTIC BONE;  Surgeon: TroAlbertine PatriciaPM;  Location: [facility];  Service: Podiatry;  Laterality: Left;  . IRRIGATION AND DEBRIDEMENT FOOT Right 12/23/2018  Procedure: IRRIGATION AND DEBRIDEMENT FOOT and wound vac application;  Surgeon: Samara Deist, DPM;  Location: [facility];  Service: Podiatry;  Laterality: Right;  . LEFT HEART CATH N/A 06/25/2019   Procedure: Left Heart Cath and Coronary  Angiography;  Surgeon: Isaias Cowman, MD;  Location: [place] CV LAB;  Service: Cardiovascular;  Laterality: N/A;  . LEFT HEART CATH AND CORONARY ANGIOGRAPHY N/A 06/05/2018   Procedure: LEFT HEART CATH AND CORONARY ANGIOGRAPHY;  Surgeon: Yolonda Kida, MD;  Location: [place] CV LAB;  Service: Cardiovascular;  Laterality: N/A;  . LOWER EXTREMITY ANGIOGRAPHY Left 08/16/2016   Procedure: Lower Extremity Angiography;  Surgeon: Algernon Huxley, MD;  Location: [place] [facility];  Service: Cardiovascular;  Laterality: Left;  . LOWER EXTREMITY ANGIOGRAPHY Left 09/01/2016   Procedure: Lower Extremity Angiography;  Surgeon: Algernon Huxley, MD;  Location: [place] [facility];  Service: Cardiovascular;  Laterality: Left;  . LOWER EXTREMITY ANGIOGRAPHY Left 10/14/2016   Procedure: Lower Extremity Angiography;  Surgeon: Algernon Huxley, MD;  Location: [place] [facility] LAB;  Service: Cardiovascular;  Laterality: Left;  . LOWER EXTREMITY ANGIOGRAPHY Left 12/20/2016   Procedure: Lower Extremity Angiography;  Surgeon: Algernon Huxley, MD;  Location: [place] [facility];  Service: Cardiovascular;  Laterality: Left;  . LOWER EXTREMITY ANGIOGRAPHY Right 12/18/2018   Procedure: LOWER EXTREMITY ANGIOGRAPHY;  Surgeon: Algernon Huxley, MD;  Location: [place] CV LAB;  Service: Cardiovascular;  Laterality: Right;  . LOWER EXTREMITY INTERVENTION  09/01/2016   Procedure: Lower Extremity Intervention;  Surgeon: Algernon Huxley, MD;  Location: [place] [facility];  Service: Cardiovascular;;  . PROSTATE SURGERY     removal  . TONSILLECTOMY     as a child    Family History  Problem Relation Age of Onset  . Hypertension Mother   . Diabetes Mother   . Prostate cancer Neg Hx   . Bladder Cancer Neg Hx   . Kidney disease Neg Hx     Social History:  reports that he quit smoking about 25 years ago. His smoking use included cigarettes. He smoked 0.25 packs per day. He has never used smokeless tobacco. He reports that  he does not drink alcohol and does not use drugs.  Allergies  Allergen Reactions  . Contrast Media [Iodinated Diagnostic Agents] Shortness Of Breath    SOB  . Iohexol Shortness Of Breath     Desc: Respiratory Distress, Laryngedema, diaphoresis, Onset Date: [id number]   . Metrizamide Shortness Of Breath    SOB SOB SOB   . Latex Rash    Medications:  I have reviewed the patient's current medications. Prior to Admission:  Facility-Administered Medications Prior to Admission  Medication Dose Route Frequency Provider Last Rate Last Admin  . diphenhydrAMINE (BENADRYL) injection 50 mg  50 mg Intravenous Once PRN Stegmayer, Kimberly A, PA-C      . famotidine (PEPCID) tablet 40 mg  40 mg Oral Once PRN Stegmayer, Janalyn Harder, PA-C       Medications Prior to Admission  Medication Sig Dispense Refill Last Dose  . acetaminophen (TYLENOL) 325 MG tablet Take 650 mg by mouth every 6 (six) hours as needed.     Marland Kitchen amiodarone (PACERONE) 200 MG tablet Take 200 mg by mouth daily.     Marland Kitchen aspirin EC 81 MG tablet Take 1 tablet (81 mg total) by mouth daily. 150 tablet 2   . atorvastatin (LIPITOR) 80 MG tablet Take 80 mg by mouth every evening.     Marland Kitchen  bisacodyl (DULCOLAX) 5 MG EC tablet Take 1 tablet (5 mg total) by mouth daily as needed for moderate constipation. 30 tablet 0   . chlorthalidone (HYGROTON) 25 MG tablet Take 25 mg by mouth daily.     . Cholecalciferol (VITAMIN D-3) 25 MCG (1000 UT) CAPS Take by mouth.     . collagenase (SANTYL) ointment Apply 1 application topically daily. (Patient not taking: Reported on 06/24/2019) 30 g 1   . diltiazem (CARDIZEM CD) 120 MG 24 hr capsule Take 120 mg by mouth daily.     . divalproex (DEPAKOTE ER) 500 MG 24 hr tablet Take 1 tablet (500 mg total) by mouth daily.     Marland Kitchen doxycycline (VIBRAMYCIN) 100 MG capsule Take 100 mg by mouth 2 (two) times daily.     . Ensure Max Protein (ENSURE MAX PROTEIN) LIQD Take 330 mLs (11 oz total) by mouth 2 (two) times daily.     .  ferrous sulfate 325 (65 FE) MG tablet Take 1 tablet (325 mg total) by mouth daily with breakfast. 30 tablet 3   . gabapentin (NEURONTIN) 300 MG capsule Take 300 mg by mouth 3 (three) times daily.      Marland Kitchen HYDROcodone-acetaminophen (NORCO/VICODIN) 5-325 MG tablet Take 1 tablet by mouth every 6 (six) hours as needed for moderate pain (pain score 4-6). (Patient not taking: Reported on 06/24/2019) 10 tablet 0   . Insulin Glargine (BASAGLAR KWIKPEN) 100 UNIT/ML SMARTSIG:15 Unit(s) SUB-Q Every Night     . insulin glargine (LANTUS) 100 UNIT/ML injection Inject 0.15 mLs (15 Units total) into the skin at bedtime. 1 vial 0   . insulin lispro (HUMALOG) 100 UNIT/ML injection Inject 0.03 mLs (3 Units total) into the skin 3 (three) times daily with meals. (Patient taking differently: Inject 3 Units into the skin 3 (three) times daily with meals. Plus 1 additional unit for every 50 BG points over 200 (max 5 additional units per dose)) 10 mL 0   . isosorbide mononitrate (IMDUR) 60 MG 24 hr tablet Take 1 tablet (60 mg total) by mouth daily. 30 tablet 0   . metFORMIN (GLUCOPHAGE) 500 MG tablet Take by mouth daily.     . metoprolol succinate (TOPROL-XL) 25 MG 24 hr tablet Take 25 mg by mouth daily.     . metoprolol tartrate (LOPRESSOR) 25 MG tablet Take 25 mg by mouth 3 (three) times daily.     . Multiple Vitamin (MULTIVITAMIN WITH MINERALS) TABS tablet Take 1 tablet by mouth daily.     . nitroGLYCERIN (NITROSTAT) 0.4 MG SL tablet Place 1 tablet (0.4 mg total) under the tongue every 5 (five) minutes as needed for chest pain. 20 tablet 0   . NOVOLOG FLEXPEN 100 UNIT/ML FlexPen      . potassium chloride (KLOR-CON) 20 MEQ packet Take by mouth 2 (two) times daily.     . Rivaroxaban (XARELTO) 15 MG TABS tablet Take 1 tablet (15 mg total) by mouth daily with supper. Hold Xarelto for 3 days.  Start on January 09, 2019. 30 tablet 1   . senna (SENOKOT) 8.6 MG TABS tablet Take 1 tablet (8.6 mg total) by mouth daily as needed for mild  constipation. 120 tablet 0   . silver sulfADIAZINE (SILVADENE) 1 % cream Apply 1 application topically daily. 50 g 0   . vitamin C (VITAMIN C) 250 MG tablet Take 1 tablet (250 mg total) by mouth 2 (two) times daily. (Patient not taking: Reported on 06/24/2019)     .  zinc sulfate 220 (50 Zn) MG capsule Take 220 mg by mouth daily.      Scheduled: . chlorhexidine gluconate (MEDLINE KIT)  15 mL Mouth Rinse BID  . Chlorhexidine Gluconate Cloth  6 each Topical Daily  . docusate  100 mg Oral BID  . free water  200 mL Per Tube Q4H  . mouth rinse  15 mL Mouth Rinse 10 times per day  . polyethylene glycol  17 g Oral Daily    ROS: Unable to obtain  Physical Examination: Blood pressure 128/66, pulse 72, temperature 98.1 F (36.7 C), resp. rate 20, height _0  (1.702 m), weight 81.4 kg, SpO2 100 %.  HEENT-  Normocephalic, no lesions, without obvious abnormality.  Normal external eye and conjunctiva.  Normal TM's bilaterally.  Normal auditory canals and external ears. Normal external nose, mucus membranes and septum.  Normal pharynx. Cardiovascular- S1, S2 normal, pulses palpable throughout   Lungs- decreased breath sounds Abdomen- soft, non-tender; bowel sounds normal; no masses,  no organomegaly Extremities- BLE BKA Lymph-no adenopathy palpable Musculoskeletal-no joint tenderness, deformity or swelling Skin-warm and dry, no hyperpigmentation, vitiligo, or suspicious lesions  Neurological Examination   Mental Status: Patient does not respond to verbal stimuli.  Does not respond to deep sternal rub.  Does not follow commands.  No verbalizations noted.  Cranial Nerves: II: patient does not respond confrontation bilaterally, pupils right 2 mm, left 2 mm,and unreactive bilaterally III,IV,VI: Oculocephalic response absent bilaterally.  V,VII: corneal reflex absent bilaterally  VIII: patient does not respond to verbal stimuli IX,X: gag reflex reduced, XI: trapezius strength unable to test  bilaterally XII: tongue strength unable to test Motor: Extremities flaccid throughout.  No spontaneous movement noted.  No purposeful movements noted. Sensory: Does not respond to noxious stimuli in any extremity. Deep Tendon Reflexes:  Absent throughout. Plantars: BLE BKA  Cerebellar: Unable to perform   Laboratory Studies:   Basic Metabolic Panel: Recent Labs  Lab 11/30/19 0109 11/30/19 0533 11/30/19 0841  NA 137 141 141  K 4.0 3.7 4.0  CL 99 103 102  CO2 _1 GLUCOSE 679* 559* 448*  BUN 28* 29* 30*  CREATININE 1.72* 1.90* 1.80*  CALCIUM 10.5* 11.9* 11.0*  MG 2.0  --   --     Liver Function Tests: Recent Labs  Lab 11/30/19 0109  AST 43*  ALT 28  ALKPHOS 172*  BILITOT 1.3*  PROT 7.9  ALBUMIN 3.5   No results for input(s): LIPASE, AMYLASE in the last 168 hours. No results for input(s): AMMONIA in the last 168 hours.  CBC: Recent Labs  Lab 11/30/19 0109  WBC 21.8*  NEUTROABS 15.1*  HGB 17.6*  HCT 52.3*  MCV 86.9  PLT 241    Cardiac Enzymes: No results for input(s): CKTOTAL, CKMB, CKMBINDEX, TROPONINI in the last 168 hours.  BNP: Invalid input(s): POCBNP  CBG: Recent Labs  Lab 11/30/19 0804 11/30/19 0848 11/30/19 0921 11/30/19 0945 11/30/19 1036  GLUCAP 404* 397* 350* 374* 296*    Microbiology: Results for orders placed or performed during the hospital encounter of 11/30/19  SARS Coronavirus 2 by RT PCR (hospital order, performed in [facility] hospital lab) Nasopharyngeal Nasopharyngeal Swab     Status: None   Collection Time: 11/30/19  2:54 AM   Specimen: Nasopharyngeal Swab  Result Value Ref Range Status   SARS Coronavirus 2 NEGATIVE NEGATIVE Final    Comment: (NOTE) SARS-CoV-2 target nucleic acids are NOT DETECTED.  The SARS-CoV-2 RNA is generally detectable  in upper and lower respiratory specimens during the acute phase of infection. The lowest concentration of SARS-CoV-2 viral copies this assay can detect is 250 copies /  mL. A negative result does not preclude SARS-CoV-2 infection and should not be used as the sole basis for treatment or other patient management decisions.  A negative result may occur with improper specimen collection / handling, submission of specimen other than nasopharyngeal swab, presence of viral mutation(s) within the areas targeted by this assay, and inadequate number of viral copies (<250 copies / mL). A negative result must be combined with clinical observations, patient history, and epidemiological information.  Fact Sheet for Patients:   StrictlyIdeas.no  Fact Sheet for Healthcare Providers: BankingDealers.co.za  This test is not yet approved or  cleared by the [place] FDA and has been authorized for detection and/or diagnosis of SARS-CoV-2 by FDA under an Emergency Use Authorization (EUA).  This EUA will remain in effect (meaning this test can be used) for the duration of the COVID-19 declaration under Section 564(b)(1) of the Act, 21 U.S.C. section 360bbb-3(b)(1), unless the authorization is terminated or revoked sooner.  Performed at [facility], [address] [place], [place] [address]   MRSA PCR Screening     Status: None   Collection Time: 11/30/19  4:44 AM   Specimen: Nasopharyngeal  Result Value Ref Range Status   MRSA by PCR NEGATIVE NEGATIVE Final    Comment:        The GeneXpert MRSA Assay (FDA approved for NASAL specimens only), is one component of a comprehensive MRSA colonization surveillance program. It is not intended to diagnose MRSA infection nor to guide or monitor treatment for MRSA infections. Performed at [facility], [address] [place], [place] [address]     Coagulation Studies: Recent Labs    11/30/19 0109  LABPROT 13.9  INR 1.1    Urinalysis:  Recent Labs  Lab 11/30/19 0228  COLORURINE STRAW*  LABSPEC 1.025  PHURINE 5.0  GLUCOSEU >=500*   HGBUR NEGATIVE  BILIRUBINUR NEGATIVE  KETONESUR NEGATIVE  PROTEINUR 100*  NITRITE NEGATIVE  LEUKOCYTESUR NEGATIVE    Lipid Panel:     Component Value Date/Time   CHOL 253 (H) 11/30/2019 0841   TRIG 174 (H) 11/30/2019 0325   HDL 34 (L) 06/25/2019 0119   CHOLHDL 3.6 06/25/2019 0119   VLDL 11 06/25/2019 0119   LDLCALC 76 06/25/2019 0119    HgbA1C:  Lab Results  Component Value Date   HGBA1C 9.4 (H) 06/24/2019    Urine Drug Screen:      Component Value Date/Time   LABOPIA NONE DETECTED 11/30/2019 0109   COCAINSCRNUR NONE DETECTED 11/30/2019 0109   LABBENZ POSITIVE (A) 11/30/2019 0109   AMPHETMU NONE DETECTED 11/30/2019 0109   THCU NONE DETECTED 11/30/2019 0109   LABBARB NONE DETECTED 11/30/2019 0109    Alcohol Level: No results for input(s): ETH in the last 168 hours.  Other results: EKG: atrial fibrillation, rate 75 bpm.  Imaging: CT ABDOMEN PELVIS WO CONTRAST  Result Date: 11/30/2019 CLINICAL DATA:  Found down with seizure-like activity, V-tach, seizure while in ED EXAM: CT HEAD WITHOUT CONTRAST CT CERVICAL SPINE WITHOUT CONTRAST CT CHEST, ABDOMEN AND PELVIS WITHOUT CONTRAST TECHNIQUE: Contiguous axial images were obtained from the base of the skull through the vertex without intravenous contrast. Multidetector CT imaging of the cervical spine was performed without intravenous contrast. Multiplanar CT image reconstructions were also generated. Multidetector CT imaging of the chest, abdomen and pelvis was  performed following the standard protocol without IV contrast. COMPARISON:  CT head 09/16/2019, MR 06/05/2018, same day chest radiograph, CT abdomen pelvis 09/17/2006 FINDINGS: CT HEAD FINDINGS Brain: No evidence of acute infarction, hemorrhage, hydrocephalus, extra-axial collection or mass lesion/mass effect. Symmetric prominence of the ventricles, cisterns and sulci compatible with parenchymal volume loss. Patchy areas of white matter hypoattenuation are most compatible  with chronic microvascular angiopathy. Vascular: Atherosclerotic calcification of the carotid siphons and intradural vertebral arteries. No hyperdense vessel. Skull: No acute scalp swelling or hematoma. No calvarial fracture or other acute or suspicious osseous lesion. Chronic left occipital scalp infiltration, likely scarring. Small right frontal scalp lipoma, stable. Sinuses/Orbits: Paranasal sinuses and mastoid air cells are predominantly clear. Pneumatization of the petrous apices. Orbital structures are unremarkable aside from prior lens extractions. Other: Extensive debris in the external auditory canals. Mild bilateral TMJ arthrosis. Largely edentulous with mild mandibular prognathism. CT CERVICAL FINDINGS Alignment: Stabilization collar is absent. Mild straightening of the upper cervical lordosis with reversal at the C4-5 levels. Mild retrolisthesis C4 on 5 and C5 on 6 is likely degenerative in nature. No evidence of traumatic listhesis. No abnormally widened, perched or jumped facets. Normal alignment of the craniocervical and atlantoaxial articulations accounting for leftward cranial rotation. Skull base and vertebrae: No acute skull base fracture. No vertebral fracture or height loss. Arthrosis present at the atlantodental interval with some mild calcific pannus formation. Multilevel spondylitic changes are detailed below. Soft tissues and spinal canal: No pre or paravertebral fluid or swelling. No visible canal hematoma. Enthesopathic changes in the nuchal ligament. Disc levels: Multilevel intervertebral disc height loss with spondylitic endplate changes. Features maximal C4-5 and C5-6 with small posterior disc osteophyte complexes resulting in at most mild canal stenosis. Uncinate spurring facet hypertrophic changes are maximal at these levels as well with moderate to severe stenosis on the left at C5-6 and more moderate changes on the right at C4-5. Additional multilevel foraminal narrowing is mild.  Other: Secretions noted in the posterior naso and oropharynx above the level of the inflated endotracheal balloon. Extensive cervical carotid atherosclerosis. No concerning thyroid nodules. CT CHEST FINDINGS Cardiovascular: Limited assessment of the vasculature in the absence of contrast media. Cardiac size within normal limits. Small pericardial effusion. Extensive three-vessel coronary artery disease. Extensive calcifications upon the aortic valve leaflets. Atherosclerotic plaque within the normal caliber aorta. Shared origin of brachiocephalic and left common carotid artery origins. Calcific plaque throughout the proximal great vessels. Central pulmonary arteries are borderline enlarged. Luminal evaluation precluded in the absence of contrast media. Mediastinum/Nodes: No mediastinal fluid or gas. Normal thyroid gland and thoracic inlet. No worrisome mediastinal or axillary adenopathy. Hilar nodal evaluation is limited in the absence of intravenous contrast media. Endotracheal tube terminates in the mid to lower trachea, 3 cm from the carina. Transesophageal tube tip terminates in the gastric body with side port distal to the GE junction. Lungs/Pleura: Heterogeneous dependent areas of mixed ground-glass and dense consolidative opacities are present in both lungs with multiple fluid-filled dependent airways as well as interlobular septal thickening and fissural thickening and small bilateral pleural effusions with further atelectatic changes and volume loss as well. No pneumothorax. Musculoskeletal: Posterior left third through sixth left rib fractures. Posterior right rib 4 rib fractures. No other acute osseous injuries in the chest wall. Asymmetric sternoclavicular arthrosis, increased on the right. Degenerative changes in both shoulders. Multilevel degenerative changes present spine Multilevel flowing anterior osteophytosis, compatible with features of diffuse idiopathic skeletal hyperostosis (DISH). Mild body  wall edema.  CT ABDOMEN AND PELVIS FINDINGS Hepatobiliary: No visible hepatic injury or concerning liver lesion on this unenhanced CT. No perihepatic hematoma. Smooth surface contour. Normal attenuation. Patient is post cholecystectomy. Slight prominence of the biliary tree likely related to post cholecystectomy reservoir effect without visible calcified intraductal gallstones. Pancreas: Moderate pancreatic atrophy and partial fatty replacement of the pancreas. No ductal dilatation, discernible lesions, or inflammation. Spleen: No direct splenic injury. No perisplenic hematoma. Normal splenic size. No concerning lesions. Few scattered vascular calcifications. Adrenals/Urinary Tract: Mild lobular thickening of the adrenal gland could reflect some senescent hyperplasia without concerning nodules. No evidence of adrenal hemorrhage. No direct renal injury or perinephric hematoma. Bilateral renal atrophy with mild symmetric perinephric stranding, possibly related to senescent change or diminished renal function. Few vascular calcifications are present in both kidneys. Collecting system calculus seen in the upper pole right kidney. No obstructive urolithiasis is evident. Hyperdense 18 mm cystic structure arising from the posterior interpolar right kidney (3/72) additional subcentimeter hyperdense cystic structure arising from the upper pole left kidney (3/65). No other concerning renal lesions. Urinary bladder is largely decompressed by Foley catheter at the time of exam and therefore poorly evaluated by CT imaging. Further obscuration by streak artifact from a hip prosthesis. Stomach/Bowel: Transesophageal tube in appropriate position terminating in the gastric body with side port distal to the GE junction as detailed above. Mild tenting of the anterior stomach is a typical finding for placement of a transesophageal tube. Small duodenal lipoma. Small distal duodenal diverticulum (3/69). No small bowel thickening or  dilatation. Mild fecalization of the distal small bowel contents can reflect some slowed intestinal transit Appendix is not visualized. No focal inflammation the vicinity of the cecum to suggest an occult appendicitis. No colonic dilatation or wall thickening. Vascular/Lymphatic: Extensive atherosclerotic calcification throughout the abdominal aorta and branch vessels. Luminal evaluation precluded in the absence of contrast media. No aneurysm or ectasia. No periaortic stranding or hemorrhage. No suspicious or enlarged lymph nodes in the included lymphatic chains. Reproductive: In prostate traversed by the Foley catheter. Coarse eccentric calcification of the prostate. No concerning abnormalities of the prostate or seminal vesicles. Nonspecific penile calcifications are noted. Other: No abdominopelvic free air or fluid. No bowel containing hernia. Small fat containing umbilical hernia. No traumatic abdominal wall dehiscence. No body wall or retroperitoneal hematoma. Musculoskeletal: The osseous structures appear diffusely demineralized which may limit detection of small or nondisplaced fractures. No acute osseous abnormality or suspicious osseous lesion. Extensive streak artifact related to patient's right hip arthroplasty. Multilevel degenerative changes are present in the imaged portions of the spine. Additional degenerative changes in the hips and pelvis. No acute osseous abnormality of the included upper extremities. IMPRESSION: CT HEAD: 1. No acute intracranial abnormality. 2. Stable parenchymal volume loss and chronic microvascular ischemic changes. 3. Debris in the external auditory canals. CT CERVICAL SPINE: 1. No acute cervical spine fracture or traumatic listhesis. 2. Multilevel degenerative changes of the cervical spine, as described above. CT CHEST, ABDOMEN AND PELVIS 1. Unenhanced CT was performed per clinician order. Lack of IV contrast limits sensitivity and specificity, especially for evaluation of  the vasculature and abdominal/pelvic solid viscera. 2. Posterior left third through sixth rib fractures. Posterior right rib 4 rib fractures. Can be seen in the setting of resuscitative efforts. No pneumothorax. 3. Heterogeneous largely dependent areas of mixed ground-glass and dense consolidation in both lungs with fluid-filled dependent airways as well as septal and fissural thickening, and small bilateral pleural effusions as well as features of  volume loss. Such findings are worrisome for sequela of aspiration likely with superimposed pulmonary edema and volume loss. Mild contusive changes may also be present in the setting of resuscitation. 4. Endotracheal tube in the mid to lower trachea, 3 cm from the carina. 5. Transesophageal tube in appropriate position terminating in the gastric body with side port distal to the GE junction. 6. No other acute abdominopelvic abnormalities. 7. Nonobstructing right nephrolithiasis. 8. Hyperdense cystic structures arising from the lateral right interpolar kidney and upper pole left kidney., possible hyperdense exophytic cyst. Recommend outpatient renal ultrasound. 9. Aortic Atherosclerosis (ICD10-I70.0). Electronically Signed   By: Lovena Le M.D.   On: 11/30/2019 02:57   CT Head Wo Contrast  Result Date: 11/30/2019 CLINICAL DATA:  Found down with seizure-like activity, V-tach, seizure while in ED EXAM: CT HEAD WITHOUT CONTRAST CT CERVICAL SPINE WITHOUT CONTRAST CT CHEST, ABDOMEN AND PELVIS WITHOUT CONTRAST TECHNIQUE: Contiguous axial images were obtained from the base of the skull through the vertex without intravenous contrast. Multidetector CT imaging of the cervical spine was performed without intravenous contrast. Multiplanar CT image reconstructions were also generated. Multidetector CT imaging of the chest, abdomen and pelvis was performed following the standard protocol without IV contrast. COMPARISON:  CT head 09/16/2019, MR 06/05/2018, same day chest  radiograph, CT abdomen pelvis 09/17/2006 FINDINGS: CT HEAD FINDINGS Brain: No evidence of acute infarction, hemorrhage, hydrocephalus, extra-axial collection or mass lesion/mass effect. Symmetric prominence of the ventricles, cisterns and sulci compatible with parenchymal volume loss. Patchy areas of white matter hypoattenuation are most compatible with chronic microvascular angiopathy. Vascular: Atherosclerotic calcification of the carotid siphons and intradural vertebral arteries. No hyperdense vessel. Skull: No acute scalp swelling or hematoma. No calvarial fracture or other acute or suspicious osseous lesion. Chronic left occipital scalp infiltration, likely scarring. Small right frontal scalp lipoma, stable. Sinuses/Orbits: Paranasal sinuses and mastoid air cells are predominantly clear. Pneumatization of the petrous apices. Orbital structures are unremarkable aside from prior lens extractions. Other: Extensive debris in the external auditory canals. Mild bilateral TMJ arthrosis. Largely edentulous with mild mandibular prognathism. CT CERVICAL FINDINGS Alignment: Stabilization collar is absent. Mild straightening of the upper cervical lordosis with reversal at the C4-5 levels. Mild retrolisthesis C4 on 5 and C5 on 6 is likely degenerative in nature. No evidence of traumatic listhesis. No abnormally widened, perched or jumped facets. Normal alignment of the craniocervical and atlantoaxial articulations accounting for leftward cranial rotation. Skull base and vertebrae: No acute skull base fracture. No vertebral fracture or height loss. Arthrosis present at the atlantodental interval with some mild calcific pannus formation. Multilevel spondylitic changes are detailed below. Soft tissues and spinal canal: No pre or paravertebral fluid or swelling. No visible canal hematoma. Enthesopathic changes in the nuchal ligament. Disc levels: Multilevel intervertebral disc height loss with spondylitic endplate changes.  Features maximal C4-5 and C5-6 with small posterior disc osteophyte complexes resulting in at most mild canal stenosis. Uncinate spurring facet hypertrophic changes are maximal at these levels as well with moderate to severe stenosis on the left at C5-6 and more moderate changes on the right at C4-5. Additional multilevel foraminal narrowing is mild. Other: Secretions noted in the posterior naso and oropharynx above the level of the inflated endotracheal balloon. Extensive cervical carotid atherosclerosis. No concerning thyroid nodules. CT CHEST FINDINGS Cardiovascular: Limited assessment of the vasculature in the absence of contrast media. Cardiac size within normal limits. Small pericardial effusion. Extensive three-vessel coronary artery disease. Extensive calcifications upon the aortic valve leaflets. Atherosclerotic plaque  within the normal caliber aorta. Shared origin of brachiocephalic and left common carotid artery origins. Calcific plaque throughout the proximal great vessels. Central pulmonary arteries are borderline enlarged. Luminal evaluation precluded in the absence of contrast media. Mediastinum/Nodes: No mediastinal fluid or gas. Normal thyroid gland and thoracic inlet. No worrisome mediastinal or axillary adenopathy. Hilar nodal evaluation is limited in the absence of intravenous contrast media. Endotracheal tube terminates in the mid to lower trachea, 3 cm from the carina. Transesophageal tube tip terminates in the gastric body with side port distal to the GE junction. Lungs/Pleura: Heterogeneous dependent areas of mixed ground-glass and dense consolidative opacities are present in both lungs with multiple fluid-filled dependent airways as well as interlobular septal thickening and fissural thickening and small bilateral pleural effusions with further atelectatic changes and volume loss as well. No pneumothorax. Musculoskeletal: Posterior left third through sixth left rib fractures. Posterior right  rib 4 rib fractures. No other acute osseous injuries in the chest wall. Asymmetric sternoclavicular arthrosis, increased on the right. Degenerative changes in both shoulders. Multilevel degenerative changes present spine Multilevel flowing anterior osteophytosis, compatible with features of diffuse idiopathic skeletal hyperostosis (DISH). Mild body wall edema. CT ABDOMEN AND PELVIS FINDINGS Hepatobiliary: No visible hepatic injury or concerning liver lesion on this unenhanced CT. No perihepatic hematoma. Smooth surface contour. Normal attenuation. Patient is post cholecystectomy. Slight prominence of the biliary tree likely related to post cholecystectomy reservoir effect without visible calcified intraductal gallstones. Pancreas: Moderate pancreatic atrophy and partial fatty replacement of the pancreas. No ductal dilatation, discernible lesions, or inflammation. Spleen: No direct splenic injury. No perisplenic hematoma. Normal splenic size. No concerning lesions. Few scattered vascular calcifications. Adrenals/Urinary Tract: Mild lobular thickening of the adrenal gland could reflect some senescent hyperplasia without concerning nodules. No evidence of adrenal hemorrhage. No direct renal injury or perinephric hematoma. Bilateral renal atrophy with mild symmetric perinephric stranding, possibly related to senescent change or diminished renal function. Few vascular calcifications are present in both kidneys. Collecting system calculus seen in the upper pole right kidney. No obstructive urolithiasis is evident. Hyperdense 18 mm cystic structure arising from the posterior interpolar right kidney (3/72) additional subcentimeter hyperdense cystic structure arising from the upper pole left kidney (3/65). No other concerning renal lesions. Urinary bladder is largely decompressed by Foley catheter at the time of exam and therefore poorly evaluated by CT imaging. Further obscuration by streak artifact from a hip prosthesis.  Stomach/Bowel: Transesophageal tube in appropriate position terminating in the gastric body with side port distal to the GE junction as detailed above. Mild tenting of the anterior stomach is a typical finding for placement of a transesophageal tube. Small duodenal lipoma. Small distal duodenal diverticulum (3/69). No small bowel thickening or dilatation. Mild fecalization of the distal small bowel contents can reflect some slowed intestinal transit Appendix is not visualized. No focal inflammation the vicinity of the cecum to suggest an occult appendicitis. No colonic dilatation or wall thickening. Vascular/Lymphatic: Extensive atherosclerotic calcification throughout the abdominal aorta and branch vessels. Luminal evaluation precluded in the absence of contrast media. No aneurysm or ectasia. No periaortic stranding or hemorrhage. No suspicious or enlarged lymph nodes in the included lymphatic chains. Reproductive: In prostate traversed by the Foley catheter. Coarse eccentric calcification of the prostate. No concerning abnormalities of the prostate or seminal vesicles. Nonspecific penile calcifications are noted. Other: No abdominopelvic free air or fluid. No bowel containing hernia. Small fat containing umbilical hernia. No traumatic abdominal wall dehiscence. No body wall or retroperitoneal hematoma.  Musculoskeletal: The osseous structures appear diffusely demineralized which may limit detection of small or nondisplaced fractures. No acute osseous abnormality or suspicious osseous lesion. Extensive streak artifact related to patient's right hip arthroplasty. Multilevel degenerative changes are present in the imaged portions of the spine. Additional degenerative changes in the hips and pelvis. No acute osseous abnormality of the included upper extremities. IMPRESSION: CT HEAD: 1. No acute intracranial abnormality. 2. Stable parenchymal volume loss and chronic microvascular ischemic changes. 3. Debris in the  external auditory canals. CT CERVICAL SPINE: 1. No acute cervical spine fracture or traumatic listhesis. 2. Multilevel degenerative changes of the cervical spine, as described above. CT CHEST, ABDOMEN AND PELVIS 1. Unenhanced CT was performed per clinician order. Lack of IV contrast limits sensitivity and specificity, especially for evaluation of the vasculature and abdominal/pelvic solid viscera. 2. Posterior left third through sixth rib fractures. Posterior right rib 4 rib fractures. Can be seen in the setting of resuscitative efforts. No pneumothorax. 3. Heterogeneous largely dependent areas of mixed ground-glass and dense consolidation in both lungs with fluid-filled dependent airways as well as septal and fissural thickening, and small bilateral pleural effusions as well as features of volume loss. Such findings are worrisome for sequela of aspiration likely with superimposed pulmonary edema and volume loss. Mild contusive changes may also be present in the setting of resuscitation. 4. Endotracheal tube in the mid to lower trachea, 3 cm from the carina. 5. Transesophageal tube in appropriate position terminating in the gastric body with side port distal to the GE junction. 6. No other acute abdominopelvic abnormalities. 7. Nonobstructing right nephrolithiasis. 8. Hyperdense cystic structures arising from the lateral right interpolar kidney and upper pole left kidney., possible hyperdense exophytic cyst. Recommend outpatient renal ultrasound. 9. Aortic Atherosclerosis (ICD10-I70.0). Electronically Signed   By: Lovena Le M.D.   On: 11/30/2019 02:57   CT Chest Wo Contrast  Result Date: 11/30/2019 CLINICAL DATA:  Found down with seizure-like activity, V-tach, seizure while in ED EXAM: CT HEAD WITHOUT CONTRAST CT CERVICAL SPINE WITHOUT CONTRAST CT CHEST, ABDOMEN AND PELVIS WITHOUT CONTRAST TECHNIQUE: Contiguous axial images were obtained from the base of the skull through the vertex without intravenous  contrast. Multidetector CT imaging of the cervical spine was performed without intravenous contrast. Multiplanar CT image reconstructions were also generated. Multidetector CT imaging of the chest, abdomen and pelvis was performed following the standard protocol without IV contrast. COMPARISON:  CT head 09/16/2019, MR 06/05/2018, same day chest radiograph, CT abdomen pelvis 09/17/2006 FINDINGS: CT HEAD FINDINGS Brain: No evidence of acute infarction, hemorrhage, hydrocephalus, extra-axial collection or mass lesion/mass effect. Symmetric prominence of the ventricles, cisterns and sulci compatible with parenchymal volume loss. Patchy areas of white matter hypoattenuation are most compatible with chronic microvascular angiopathy. Vascular: Atherosclerotic calcification of the carotid siphons and intradural vertebral arteries. No hyperdense vessel. Skull: No acute scalp swelling or hematoma. No calvarial fracture or other acute or suspicious osseous lesion. Chronic left occipital scalp infiltration, likely scarring. Small right frontal scalp lipoma, stable. Sinuses/Orbits: Paranasal sinuses and mastoid air cells are predominantly clear. Pneumatization of the petrous apices. Orbital structures are unremarkable aside from prior lens extractions. Other: Extensive debris in the external auditory canals. Mild bilateral TMJ arthrosis. Largely edentulous with mild mandibular prognathism. CT CERVICAL FINDINGS Alignment: Stabilization collar is absent. Mild straightening of the upper cervical lordosis with reversal at the C4-5 levels. Mild retrolisthesis C4 on 5 and C5 on 6 is likely degenerative in nature. No evidence of traumatic listhesis. No abnormally  widened, perched or jumped facets. Normal alignment of the craniocervical and atlantoaxial articulations accounting for leftward cranial rotation. Skull base and vertebrae: No acute skull base fracture. No vertebral fracture or height loss. Arthrosis present at the  atlantodental interval with some mild calcific pannus formation. Multilevel spondylitic changes are detailed below. Soft tissues and spinal canal: No pre or paravertebral fluid or swelling. No visible canal hematoma. Enthesopathic changes in the nuchal ligament. Disc levels: Multilevel intervertebral disc height loss with spondylitic endplate changes. Features maximal C4-5 and C5-6 with small posterior disc osteophyte complexes resulting in at most mild canal stenosis. Uncinate spurring facet hypertrophic changes are maximal at these levels as well with moderate to severe stenosis on the left at C5-6 and more moderate changes on the right at C4-5. Additional multilevel foraminal narrowing is mild. Other: Secretions noted in the posterior naso and oropharynx above the level of the inflated endotracheal balloon. Extensive cervical carotid atherosclerosis. No concerning thyroid nodules. CT CHEST FINDINGS Cardiovascular: Limited assessment of the vasculature in the absence of contrast media. Cardiac size within normal limits. Small pericardial effusion. Extensive three-vessel coronary artery disease. Extensive calcifications upon the aortic valve leaflets. Atherosclerotic plaque within the normal caliber aorta. Shared origin of brachiocephalic and left common carotid artery origins. Calcific plaque throughout the proximal great vessels. Central pulmonary arteries are borderline enlarged. Luminal evaluation precluded in the absence of contrast media. Mediastinum/Nodes: No mediastinal fluid or gas. Normal thyroid gland and thoracic inlet. No worrisome mediastinal or axillary adenopathy. Hilar nodal evaluation is limited in the absence of intravenous contrast media. Endotracheal tube terminates in the mid to lower trachea, 3 cm from the carina. Transesophageal tube tip terminates in the gastric body with side port distal to the GE junction. Lungs/Pleura: Heterogeneous dependent areas of mixed ground-glass and dense  consolidative opacities are present in both lungs with multiple fluid-filled dependent airways as well as interlobular septal thickening and fissural thickening and small bilateral pleural effusions with further atelectatic changes and volume loss as well. No pneumothorax. Musculoskeletal: Posterior left third through sixth left rib fractures. Posterior right rib 4 rib fractures. No other acute osseous injuries in the chest wall. Asymmetric sternoclavicular arthrosis, increased on the right. Degenerative changes in both shoulders. Multilevel degenerative changes present spine Multilevel flowing anterior osteophytosis, compatible with features of diffuse idiopathic skeletal hyperostosis (DISH). Mild body wall edema. CT ABDOMEN AND PELVIS FINDINGS Hepatobiliary: No visible hepatic injury or concerning liver lesion on this unenhanced CT. No perihepatic hematoma. Smooth surface contour. Normal attenuation. Patient is post cholecystectomy. Slight prominence of the biliary tree likely related to post cholecystectomy reservoir effect without visible calcified intraductal gallstones. Pancreas: Moderate pancreatic atrophy and partial fatty replacement of the pancreas. No ductal dilatation, discernible lesions, or inflammation. Spleen: No direct splenic injury. No perisplenic hematoma. Normal splenic size. No concerning lesions. Few scattered vascular calcifications. Adrenals/Urinary Tract: Mild lobular thickening of the adrenal gland could reflect some senescent hyperplasia without concerning nodules. No evidence of adrenal hemorrhage. No direct renal injury or perinephric hematoma. Bilateral renal atrophy with mild symmetric perinephric stranding, possibly related to senescent change or diminished renal function. Few vascular calcifications are present in both kidneys. Collecting system calculus seen in the upper pole right kidney. No obstructive urolithiasis is evident. Hyperdense 18 mm cystic structure arising from the  posterior interpolar right kidney (3/72) additional subcentimeter hyperdense cystic structure arising from the upper pole left kidney (3/65). No other concerning renal lesions. Urinary bladder is largely decompressed by Foley catheter at the time  of exam and therefore poorly evaluated by CT imaging. Further obscuration by streak artifact from a hip prosthesis. Stomach/Bowel: Transesophageal tube in appropriate position terminating in the gastric body with side port distal to the GE junction as detailed above. Mild tenting of the anterior stomach is a typical finding for placement of a transesophageal tube. Small duodenal lipoma. Small distal duodenal diverticulum (3/69). No small bowel thickening or dilatation. Mild fecalization of the distal small bowel contents can reflect some slowed intestinal transit Appendix is not visualized. No focal inflammation the vicinity of the cecum to suggest an occult appendicitis. No colonic dilatation or wall thickening. Vascular/Lymphatic: Extensive atherosclerotic calcification throughout the abdominal aorta and branch vessels. Luminal evaluation precluded in the absence of contrast media. No aneurysm or ectasia. No periaortic stranding or hemorrhage. No suspicious or enlarged lymph nodes in the included lymphatic chains. Reproductive: In prostate traversed by the Foley catheter. Coarse eccentric calcification of the prostate. No concerning abnormalities of the prostate or seminal vesicles. Nonspecific penile calcifications are noted. Other: No abdominopelvic free air or fluid. No bowel containing hernia. Small fat containing umbilical hernia. No traumatic abdominal wall dehiscence. No body wall or retroperitoneal hematoma. Musculoskeletal: The osseous structures appear diffusely demineralized which may limit detection of small or nondisplaced fractures. No acute osseous abnormality or suspicious osseous lesion. Extensive streak artifact related to patient's right hip  arthroplasty. Multilevel degenerative changes are present in the imaged portions of the spine. Additional degenerative changes in the hips and pelvis. No acute osseous abnormality of the included upper extremities. IMPRESSION: CT HEAD: 1. No acute intracranial abnormality. 2. Stable parenchymal volume loss and chronic microvascular ischemic changes. 3. Debris in the external auditory canals. CT CERVICAL SPINE: 1. No acute cervical spine fracture or traumatic listhesis. 2. Multilevel degenerative changes of the cervical spine, as described above. CT CHEST, ABDOMEN AND PELVIS 1. Unenhanced CT was performed per clinician order. Lack of IV contrast limits sensitivity and specificity, especially for evaluation of the vasculature and abdominal/pelvic solid viscera. 2. Posterior left third through sixth rib fractures. Posterior right rib 4 rib fractures. Can be seen in the setting of resuscitative efforts. No pneumothorax. 3. Heterogeneous largely dependent areas of mixed ground-glass and dense consolidation in both lungs with fluid-filled dependent airways as well as septal and fissural thickening, and small bilateral pleural effusions as well as features of volume loss. Such findings are worrisome for sequela of aspiration likely with superimposed pulmonary edema and volume loss. Mild contusive changes may also be present in the setting of resuscitation. 4. Endotracheal tube in the mid to lower trachea, 3 cm from the carina. 5. Transesophageal tube in appropriate position terminating in the gastric body with side port distal to the GE junction. 6. No other acute abdominopelvic abnormalities. 7. Nonobstructing right nephrolithiasis. 8. Hyperdense cystic structures arising from the lateral right interpolar kidney and upper pole left kidney., possible hyperdense exophytic cyst. Recommend outpatient renal ultrasound. 9. Aortic Atherosclerosis (ICD10-I70.0). Electronically Signed   By: Lovena Le M.D.   On: 11/30/2019 02:57    CT Cervical Spine Wo Contrast  Result Date: 11/30/2019 CLINICAL DATA:  Found down with seizure-like activity, V-tach, seizure while in ED EXAM: CT HEAD WITHOUT CONTRAST CT CERVICAL SPINE WITHOUT CONTRAST CT CHEST, ABDOMEN AND PELVIS WITHOUT CONTRAST TECHNIQUE: Contiguous axial images were obtained from the base of the skull through the vertex without intravenous contrast. Multidetector CT imaging of the cervical spine was performed without intravenous contrast. Multiplanar CT image reconstructions were also generated. Multidetector CT  imaging of the chest, abdomen and pelvis was performed following the standard protocol without IV contrast. COMPARISON:  CT head 09/16/2019, MR 06/05/2018, same day chest radiograph, CT abdomen pelvis 09/17/2006 FINDINGS: CT HEAD FINDINGS Brain: No evidence of acute infarction, hemorrhage, hydrocephalus, extra-axial collection or mass lesion/mass effect. Symmetric prominence of the ventricles, cisterns and sulci compatible with parenchymal volume loss. Patchy areas of white matter hypoattenuation are most compatible with chronic microvascular angiopathy. Vascular: Atherosclerotic calcification of the carotid siphons and intradural vertebral arteries. No hyperdense vessel. Skull: No acute scalp swelling or hematoma. No calvarial fracture or other acute or suspicious osseous lesion. Chronic left occipital scalp infiltration, likely scarring. Small right frontal scalp lipoma, stable. Sinuses/Orbits: Paranasal sinuses and mastoid air cells are predominantly clear. Pneumatization of the petrous apices. Orbital structures are unremarkable aside from prior lens extractions. Other: Extensive debris in the external auditory canals. Mild bilateral TMJ arthrosis. Largely edentulous with mild mandibular prognathism. CT CERVICAL FINDINGS Alignment: Stabilization collar is absent. Mild straightening of the upper cervical lordosis with reversal at the C4-5 levels. Mild retrolisthesis C4 on 5 and  C5 on 6 is likely degenerative in nature. No evidence of traumatic listhesis. No abnormally widened, perched or jumped facets. Normal alignment of the craniocervical and atlantoaxial articulations accounting for leftward cranial rotation. Skull base and vertebrae: No acute skull base fracture. No vertebral fracture or height loss. Arthrosis present at the atlantodental interval with some mild calcific pannus formation. Multilevel spondylitic changes are detailed below. Soft tissues and spinal canal: No pre or paravertebral fluid or swelling. No visible canal hematoma. Enthesopathic changes in the nuchal ligament. Disc levels: Multilevel intervertebral disc height loss with spondylitic endplate changes. Features maximal C4-5 and C5-6 with small posterior disc osteophyte complexes resulting in at most mild canal stenosis. Uncinate spurring facet hypertrophic changes are maximal at these levels as well with moderate to severe stenosis on the left at C5-6 and more moderate changes on the right at C4-5. Additional multilevel foraminal narrowing is mild. Other: Secretions noted in the posterior naso and oropharynx above the level of the inflated endotracheal balloon. Extensive cervical carotid atherosclerosis. No concerning thyroid nodules. CT CHEST FINDINGS Cardiovascular: Limited assessment of the vasculature in the absence of contrast media. Cardiac size within normal limits. Small pericardial effusion. Extensive three-vessel coronary artery disease. Extensive calcifications upon the aortic valve leaflets. Atherosclerotic plaque within the normal caliber aorta. Shared origin of brachiocephalic and left common carotid artery origins. Calcific plaque throughout the proximal great vessels. Central pulmonary arteries are borderline enlarged. Luminal evaluation precluded in the absence of contrast media. Mediastinum/Nodes: No mediastinal fluid or gas. Normal thyroid gland and thoracic inlet. No worrisome mediastinal or  axillary adenopathy. Hilar nodal evaluation is limited in the absence of intravenous contrast media. Endotracheal tube terminates in the mid to lower trachea, 3 cm from the carina. Transesophageal tube tip terminates in the gastric body with side port distal to the GE junction. Lungs/Pleura: Heterogeneous dependent areas of mixed ground-glass and dense consolidative opacities are present in both lungs with multiple fluid-filled dependent airways as well as interlobular septal thickening and fissural thickening and small bilateral pleural effusions with further atelectatic changes and volume loss as well. No pneumothorax. Musculoskeletal: Posterior left third through sixth left rib fractures. Posterior right rib 4 rib fractures. No other acute osseous injuries in the chest wall. Asymmetric sternoclavicular arthrosis, increased on the right. Degenerative changes in both shoulders. Multilevel degenerative changes present spine Multilevel flowing anterior osteophytosis, compatible with features of diffuse idiopathic  skeletal hyperostosis (DISH). Mild body wall edema. CT ABDOMEN AND PELVIS FINDINGS Hepatobiliary: No visible hepatic injury or concerning liver lesion on this unenhanced CT. No perihepatic hematoma. Smooth surface contour. Normal attenuation. Patient is post cholecystectomy. Slight prominence of the biliary tree likely related to post cholecystectomy reservoir effect without visible calcified intraductal gallstones. Pancreas: Moderate pancreatic atrophy and partial fatty replacement of the pancreas. No ductal dilatation, discernible lesions, or inflammation. Spleen: No direct splenic injury. No perisplenic hematoma. Normal splenic size. No concerning lesions. Few scattered vascular calcifications. Adrenals/Urinary Tract: Mild lobular thickening of the adrenal gland could reflect some senescent hyperplasia without concerning nodules. No evidence of adrenal hemorrhage. No direct renal injury or perinephric  hematoma. Bilateral renal atrophy with mild symmetric perinephric stranding, possibly related to senescent change or diminished renal function. Few vascular calcifications are present in both kidneys. Collecting system calculus seen in the upper pole right kidney. No obstructive urolithiasis is evident. Hyperdense 18 mm cystic structure arising from the posterior interpolar right kidney (3/72) additional subcentimeter hyperdense cystic structure arising from the upper pole left kidney (3/65). No other concerning renal lesions. Urinary bladder is largely decompressed by Foley catheter at the time of exam and therefore poorly evaluated by CT imaging. Further obscuration by streak artifact from a hip prosthesis. Stomach/Bowel: Transesophageal tube in appropriate position terminating in the gastric body with side port distal to the GE junction as detailed above. Mild tenting of the anterior stomach is a typical finding for placement of a transesophageal tube. Small duodenal lipoma. Small distal duodenal diverticulum (3/69). No small bowel thickening or dilatation. Mild fecalization of the distal small bowel contents can reflect some slowed intestinal transit Appendix is not visualized. No focal inflammation the vicinity of the cecum to suggest an occult appendicitis. No colonic dilatation or wall thickening. Vascular/Lymphatic: Extensive atherosclerotic calcification throughout the abdominal aorta and branch vessels. Luminal evaluation precluded in the absence of contrast media. No aneurysm or ectasia. No periaortic stranding or hemorrhage. No suspicious or enlarged lymph nodes in the included lymphatic chains. Reproductive: In prostate traversed by the Foley catheter. Coarse eccentric calcification of the prostate. No concerning abnormalities of the prostate or seminal vesicles. Nonspecific penile calcifications are noted. Other: No abdominopelvic free air or fluid. No bowel containing hernia. Small fat containing  umbilical hernia. No traumatic abdominal wall dehiscence. No body wall or retroperitoneal hematoma. Musculoskeletal: The osseous structures appear diffusely demineralized which may limit detection of small or nondisplaced fractures. No acute osseous abnormality or suspicious osseous lesion. Extensive streak artifact related to patient's right hip arthroplasty. Multilevel degenerative changes are present in the imaged portions of the spine. Additional degenerative changes in the hips and pelvis. No acute osseous abnormality of the included upper extremities. IMPRESSION: CT HEAD: 1. No acute intracranial abnormality. 2. Stable parenchymal volume loss and chronic microvascular ischemic changes. 3. Debris in the external auditory canals. CT CERVICAL SPINE: 1. No acute cervical spine fracture or traumatic listhesis. 2. Multilevel degenerative changes of the cervical spine, as described above. CT CHEST, ABDOMEN AND PELVIS 1. Unenhanced CT was performed per clinician order. Lack of IV contrast limits sensitivity and specificity, especially for evaluation of the vasculature and abdominal/pelvic solid viscera. 2. Posterior left third through sixth rib fractures. Posterior right rib 4 rib fractures. Can be seen in the setting of resuscitative efforts. No pneumothorax. 3. Heterogeneous largely dependent areas of mixed ground-glass and dense consolidation in both lungs with fluid-filled dependent airways as well as septal and fissural thickening, and small bilateral  pleural effusions as well as features of volume loss. Such findings are worrisome for sequela of aspiration likely with superimposed pulmonary edema and volume loss. Mild contusive changes may also be present in the setting of resuscitation. 4. Endotracheal tube in the mid to lower trachea, 3 cm from the carina. 5. Transesophageal tube in appropriate position terminating in the gastric body with side port distal to the GE junction. 6. No other acute abdominopelvic  abnormalities. 7. Nonobstructing right nephrolithiasis. 8. Hyperdense cystic structures arising from the lateral right interpolar kidney and upper pole left kidney., possible hyperdense exophytic cyst. Recommend outpatient renal ultrasound. 9. Aortic Atherosclerosis (ICD10-I70.0). Electronically Signed   By: Lovena Le M.D.   On: 11/30/2019 02:57   DG Chest Portable 1 View  Result Date: 11/30/2019 CLINICAL DATA:  Shortness of breath EXAM: PORTABLE CHEST 1 VIEW COMPARISON:  09/16/2019 FINDINGS: Mild cardiomegaly. Diffuse interstitial opacity. No pleural effusion or pneumothorax. No focal airspace consolidation. IMPRESSION: Cardiomegaly and mild pulmonary edema. Electronically Signed   By: Ulyses Jarred M.D.   On: 11/30/2019 01:55     Assessment/Plan: 85 y.o. male who is intubated and sedated and unable to provide any history therefore al history obtained from the chart.  Patient with a history of A. fib on Xarelto, diastolic CHF, CKD, diabetes, bilateral BKA's, peripheral vascular disease, sleep apnea who presents for hyperglycemia and syncope.  According to EMS patient had a syncopal event.  When EMS arrived patient was found facedown on the floor, confused.  Patient had pulseand was breathing. CBG elevated. Patient was combative requiring 75m of IV versed. Patient then went into Vtach and received 1587mof amiodarone prior to arrival.  On arrival to the emergency room patient was agitated, not answering any questions, with increased work of breathing, gurgling sounds, moving all extremities. Had witnessed seizure receiving 75m54mf Ativan.  Currently intubated and sedated.  Patient with no documented history of seizures.  Suspect seizure activity was provoked due decreased seizure threshold in the setting of prior stroke with current multiple medical issues including metabolic abnormalities, infection and hyperglycemia.  Head CT personally reviewed and shows no acute changes.   Due to patient's sedated  state though can not rule out nonconvulsive seizure activity and with history of atrial fibrillation, infarct is on the differential as well.  Further work up recommended.    1. Anticonvulsant therapy not indicated at this time 2. EEG 3. MRI of the brain without contrast 4. Seizure precautions   LesAlexis GoodellD Neurology [id number], 10:59 AM

## 2019-11-30 NOTE — Code Documentation (Signed)
Pt seizing, 2mg  ativan given md veronese at bedsidde

## 2019-11-30 NOTE — Progress Notes (Signed)
Pharmacy Antibiotic Note  [name] is a 85 y.o. male admitted on 11/30/2019 with with Tonic-Clonic seizure, Acute Hypoxic Hypercapnic Respiratory Failurerequiring intubationsecondary to Aspiration Pneumonia &Pulmonary Edema, A-fib w/ RVR, intermittent nonsustained V-tach, questionable NSTEMI, and HHS.  pneumonia.  Pharmacy was consulted for Unasyn dosing. His renal function is noted to be below his baseline level  Plan:    adjust Unasyn dose to 3 grams IV every 6 hours  Height: 5\' 7"  (170.2 cm) Weight: 81.4 kg (179 lb 7.3 oz) IBW/kg (Calculated) : 66.1  Temp (24hrs), Avg:97.7 F (36.5 C), Min:96.5 F (35.8 C), Max:98.4 F (36.9 C)  Recent Labs  Lab 11/30/19 0109 11/30/19 0533  WBC 21.8*  --   CREATININE 1.72* 1.90*  LATICACIDVEN 7.1* 5.7*    Estimated Creatinine Clearance: 30.6 mL/min (A) (by C-G formula based on SCr of 1.9 mg/dL (H)).    Allergies  Allergen Reactions  . Contrast Media [Iodinated Diagnostic Agents] Shortness Of Breath    SOB  . Iohexol Shortness Of Breath     Desc: Respiratory Distress, Laryngedema, diaphoresis, Onset Date: [id number]   . Metrizamide Shortness Of Breath    SOB SOB SOB   . Latex Rash    Antimicrobials this admission: Unasyn 7/9  >>   Microbiology results: 7/9 BCx: negative 7/9 RCx: pending  7/9 SARS CoV-2: negative   7/9 MRSA PCR: negative  Thank you for allowing pharmacy to be a part of this patient's care.  Dallie Piles 11/30/2019 8:51 AM

## 2019-11-30 NOTE — ED Notes (Signed)
Heparin 1000unit/hr

## 2019-11-30 NOTE — ED Provider Notes (Signed)
[facility] Emergency Department Provider Note  ____________________________________________  Time seen: Approximately 1:34 AM  I have reviewed the triage vital signs and the nursing notes.   HISTORY  Chief Complaint Hyperglycemia  Level 5 caveat:  Portions of the history and physical were unable to be obtained due to AMS   HPI [name] is a 85 y.o. male history of A. fib on Xarelto, diastolic CHF, CKD, diabetes, bilateral BKA's, peripheral vascular disease, sleep apnea who presents for hyperglycemia and syncope.  According to EMS patient had a syncopal event this evening.  When EMS arrived patient was found facedown on the floor, confused.  Patient had pulse and was breathing. CBG too high to read. Patient was combative requiring 2mg  of IV versed. Patient then went into Vtach and received 150mg  of amiodarone prior to arrival.  On arrival to the emergency room patient was agitated, not answering any questions, with increased work of breathing, gurgling sounds, moving all extremities but unable to provide any history  Past Medical History:  Diagnosis Date  . Arthritis   . Atrial fibrillation ([place])   . Carcinoma of prostate ([place])   . CHF (congestive heart failure) ([place])   . Chronic kidney disease   . Diabetes mellitus without complication ([place])   . ED (erectile dysfunction)   . Frequent urination   . Hyperlipidemia   . Hypertension   . Moderate mitral insufficiency   . Peripheral vascular disease ([place])   . Pneumonia 04/2016  . Prostate cancer ([place])   . Sleep apnea    OSA--USE C-PAP  . Ulcer of left foot due to type 2 diabetes mellitus ([place])   . Urinary stress incontinence, male     Patient Active Problem List   Diagnosis Date Noted  . Seizures ([place]) 11/30/2019  . ACS (acute coronary syndrome) ([place]) 06/24/2019  . Bradycardia 05/16/2019  . Paroxysmal A-fib ([place]) 03/27/2019  . Hx of BKA, right ([place]) 03/02/2019  . Severe  protein-calorie malnutrition ([place]) 02/16/2019  . Acute systolic CHF (congestive heart failure) ([place]) 02/16/2019  . Acute respiratory failure with hypoxia ([place]) 02/08/2019  . Pneumonia due to COVID-19 virus 02/08/2019  . Below-knee amputation of right lower extremity ([place]) 01/15/2019  . Polyp of descending colon   . Iron deficiency anemia due to chronic blood loss   . Sacral decubitus ulcer, stage II ([place]) 12/26/2018  . Acute respiratory failure ([place]) 12/20/2018  . Femur fracture ([place]) 10/13/2018  . Chest pain 06/03/2018  . NSTEMI (non-ST elevated myocardial infarction) ([place]) 01/16/2018  . Acute CHF (congestive heart failure) ([place]) 11/23/2017  . Atrial fibrillation with rapid ventricular response ([place]) 10/15/2017  . BKA stump complication ([place]) 56/21/3086  . 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 ([place]) 02/10/2017  . S/P BKA (below knee amputation) unilateral, left ([place]) 02/01/2017  . Chronic diastolic heart failure ([place]) 01/21/2017  . Pressure injury of skin 01/18/2017  . Atherosclerosis of artery of extremity with gangrene ([place]) 01/05/2017  . Diabetic foot ulcer ([place]) 12/29/2016  . Ataxia 10/21/2016  . Atherosclerosis of native arteries of the extremities with ulceration ([place]) 10/12/2016  . History of femoral angiogram 09/06/2016  . Left leg pain 09/06/2016  . Leukocytosis 09/06/2016  . Generalized weakness 09/06/2016  . Atrial fibrillation, chronic ([place]) 08/30/2016  . Pneumonia 05/19/2016  . Hyperlipidemia 04/20/2016  . Back pain 04/19/2016  . Type 2 diabetes mellitus treated with insulin ([place]) 04/19/2016  . Essential hypertension 04/19/2016  . PAD (peripheral artery disease) ([place]) 04/19/2016  .  Erectile dysfunction following radical prostatectomy 06/25/2015  . History of prostate cancer 12/23/2014  . Incontinence 12/23/2014    Past Surgical History:  Procedure Laterality Date  . AMPUTATION Left 01/12/2017   Procedure: AMPUTATION BELOW KNEE;  Surgeon: Algernon Huxley, MD;  Location: [facility] ORS;  Service: General;  Laterality: Left;  . AMPUTATION Right 12/27/2018   Procedure: AMPUTATION BELOW KNEE;  Surgeon: Algernon Huxley, MD;  Location: [facility] ORS;  Service: General;  Laterality: Right;  . APLIGRAFT PLACEMENT Left 10/15/2016   Procedure: APLIGRAFT PLACEMENT;  Surgeon: Albertine Patricia, DPM;  Location: [facility] ORS;  Service: Podiatry;  Laterality: Left;  necrotic ulcer  . CHOLECYSTECTOMY    . COLONOSCOPY WITH PROPOFOL N/A 01/02/2019   Procedure: COLONOSCOPY WITH PROPOFOL;  Surgeon: Lucilla Lame, MD;  Location: [facility] ENDOSCOPY;  Service: Endoscopy;  Laterality: N/A;  . COLONOSCOPY WITH PROPOFOL N/A 01/05/2019   Procedure: COLONOSCOPY WITH PROPOFOL;  Surgeon: Lucilla Lame, MD;  Location: [facility] ENDOSCOPY;  Service: Endoscopy;  Laterality: N/A;  . ESOPHAGOGASTRODUODENOSCOPY (EGD) WITH PROPOFOL N/A 01/02/2019   Procedure: ESOPHAGOGASTRODUODENOSCOPY (EGD) WITH PROPOFOL;  Surgeon: Lucilla Lame, MD;  Location: [facility] ENDOSCOPY;  Service: Endoscopy;  Laterality: N/A;  . EYE SURGERY Bilateral    Cataract Extraction with IOL  . HIP ARTHROPLASTY Right 10/13/2018   Procedure: ARTHROPLASTY BIPOLAR HIP (HEMIARTHROPLASTY);  Surgeon: Earnestine Leys, MD;  Location: [facility] ORS;  Service: Orthopedics;  Laterality: Right;  . IRRIGATION AND DEBRIDEMENT FOOT Left 10/15/2016   Procedure: IRRIGATION AND DEBRIDEMENT FOOT-EXCISIONAL DEBRIDEMENT OF SKIN 3RD, 4TH AND 5TH TOES WITH APPLICATION OF APLIGRAFT;  Surgeon: Albertine Patricia, DPM;  Location: [facility] ORS;  Service: Podiatry;  Laterality: Left;  necrotic,gangrene  . IRRIGATION AND DEBRIDEMENT FOOT Left 12/09/2016   Procedure: IRRIGATION AND DEBRIDEMENT FOOT-MUSCLE/FASCIA, RESECTION OF FOURTH AND FIFTH METATARSAL NECROTIC BONE;  Surgeon: Albertine Patricia, DPM;  Location: [facility] ORS;  Service: Podiatry;  Laterality: Left;  . IRRIGATION AND DEBRIDEMENT FOOT Right 12/23/2018   Procedure: IRRIGATION AND DEBRIDEMENT FOOT and wound vac application;  Surgeon:  Samara Deist, DPM;  Location: [facility] ORS;  Service: Podiatry;  Laterality: Right;  . LEFT HEART CATH N/A 06/25/2019   Procedure: Left Heart Cath and Coronary Angiography;  Surgeon: Isaias Cowman, MD;  Location: [place] CV LAB;  Service: Cardiovascular;  Laterality: N/A;  . LEFT HEART CATH AND CORONARY ANGIOGRAPHY N/A 06/05/2018   Procedure: LEFT HEART CATH AND CORONARY ANGIOGRAPHY;  Surgeon: Yolonda Kida, MD;  Location: [place] [facility] LAB;  Service: Cardiovascular;  Laterality: N/A;  . LOWER EXTREMITY ANGIOGRAPHY Left 08/16/2016   Procedure: Lower Extremity Angiography;  Surgeon: Algernon Huxley, MD;  Location: [place] [facility];  Service: Cardiovascular;  Laterality: Left;  . LOWER EXTREMITY ANGIOGRAPHY Left 09/01/2016   Procedure: Lower Extremity Angiography;  Surgeon: Algernon Huxley, MD;  Location: [place] [facility];  Service: Cardiovascular;  Laterality: Left;  . LOWER EXTREMITY ANGIOGRAPHY Left 10/14/2016   Procedure: Lower Extremity Angiography;  Surgeon: Algernon Huxley, MD;  Location: [place] [facility];  Service: Cardiovascular;  Laterality: Left;  . LOWER EXTREMITY ANGIOGRAPHY Left 12/20/2016   Procedure: Lower Extremity Angiography;  Surgeon: Algernon Huxley, MD;  Location: [place] [facility];  Service: Cardiovascular;  Laterality: Left;  . LOWER EXTREMITY ANGIOGRAPHY Right 12/18/2018   Procedure: LOWER EXTREMITY ANGIOGRAPHY;  Surgeon: Algernon Huxley, MD;  Location: [place] [facility];  Service: Cardiovascular;  Laterality: Right;  . LOWER EXTREMITY INTERVENTION  09/01/2016   Procedure: Lower Extremity Intervention;  Surgeon: Corene Cornea  Bunnie Domino, MD;  Location: [place] [facility];  Service: Cardiovascular;;  . PROSTATE SURGERY     removal  . TONSILLECTOMY     as a child    Prior to Admission medications   Medication Sig Start Date End Date Taking? Authorizing Provider  acetaminophen (TYLENOL) 325 MG tablet Take 650 mg by mouth every 6 (six) hours as needed.    [provider]  amiodarone (PACERONE) 200 MG tablet Take 200 mg by mouth daily. 09/13/18   [provider]  aspirin EC 81 MG tablet Take 1 tablet (81 mg total) by mouth daily. 12/18/18   Algernon Huxley, MD  atorvastatin (LIPITOR) 80 MG tablet Take 80 mg by mouth every evening. 07/31/18   [provider]  bisacodyl (DULCOLAX) 5 MG EC tablet Take 1 tablet (5 mg total) by mouth daily as needed for moderate constipation. 01/05/19   [name], MD  chlorthalidone (HYGROTON) 25 MG tablet Take 25 mg by mouth daily.    [provider]  Cholecalciferol (VITAMIN D-3) 25 MCG (1000 UT) CAPS Take by mouth.    [provider]  collagenase (SANTYL) ointment Apply 1 application topically daily. Patient not taking: Reported on 06/24/2019 04/16/19   Kris Hartmann, NP  diltiazem (CARDIZEM CD) 120 MG 24 hr capsule Take 120 mg by mouth daily. 09/28/18   [provider]  divalproex (DEPAKOTE ER) 500 MG 24 hr tablet Take 1 tablet (500 mg total) by mouth daily. 02/16/19   Charlynne Cousins, MD  doxycycline (VIBRAMYCIN) 100 MG capsule Take 100 mg by mouth 2 (two) times daily.    [provider]  Ensure Max Protein (ENSURE MAX PROTEIN) LIQD Take 330 mLs (11 oz total) by mouth 2 (two) times daily. 01/05/19   [name], MD  ferrous sulfate 325 (65 FE) MG tablet Take 1 tablet (325 mg total) by mouth daily with breakfast. 10/18/18   [name] Mandes, MD  gabapentin (NEURONTIN) 300 MG capsule Take 300 mg by mouth 3 (three) times daily.  02/07/19   [provider]  HYDROcodone-acetaminophen (NORCO/VICODIN) 5-325 MG tablet Take 1 tablet by mouth every 6 (six) hours as needed for moderate pain (pain score 4-6). Patient not taking: Reported on 06/24/2019 02/19/19   Charlynne Cousins, MD  Insulin Glargine [facility]) 100 UNIT/ML SMARTSIG:15 Unit(s) SUB-Q Every Night 09/12/19   [provider]  insulin glargine (LANTUS) 100 UNIT/ML injection Inject 0.15 mLs (15 Units  total) into the skin at bedtime. 10/18/18   [name] Mandes, MD  insulin lispro (HUMALOG) 100 UNIT/ML injection Inject 0.03 mLs (3 Units total) into the skin 3 (three) times daily with meals. Patient taking differently: Inject 3 Units into the skin 3 (three) times daily with meals. Plus 1 additional unit for every 50 BG points over 200 (max 5 additional units per dose) 11/28/17   Loletha Grayer, MD  isosorbide mononitrate (IMDUR) 60 MG 24 hr tablet Take 1 tablet (60 mg total) by mouth daily. 04/06/19   Arta Silence, MD  metFORMIN (GLUCOPHAGE) 500 MG tablet Take by mouth daily.    [provider]  metoprolol succinate (TOPROL-XL) 25 MG 24 hr tablet Take 25 mg by mouth daily.    [provider]  metoprolol tartrate (LOPRESSOR) 25 MG tablet Take 25 mg by mouth 3 (three) times daily. 04/08/19   [provider]  Multiple Vitamin (MULTIVITAMIN WITH MINERALS) TABS tablet Take 1 tablet by mouth daily. 01/05/19   [name], MD  nitroGLYCERIN (NITROSTAT) 0.4 MG SL tablet Place 1 tablet (0.4 mg total) under the tongue every 5 (five) minutes as needed for chest pain. 06/09/18   Hillary Bow, MD  NOVOLOG FLEXPEN 100 UNIT/ML FlexPen  09/12/19   [provider]  potassium chloride (KLOR-CON) 20 MEQ packet Take by mouth 2 (two) times daily.    [provider]  Rivaroxaban (XARELTO) 15 MG TABS tablet Take 1 tablet (15 mg total) by mouth daily with supper. Hold Xarelto for 3 days.  Start on January 09, 2019. 01/09/19   [name], MD  senna (SENOKOT) 8.6 MG TABS tablet Take 1 tablet (8.6 mg total) by mouth daily as needed for mild constipation. 01/05/19   [name], MD  silver sulfADIAZINE (SILVADENE) 1 % cream Apply 1 application topically daily. 01/30/19   Kris Hartmann, NP  vitamin C (VITAMIN C) 250 MG tablet Take 1 tablet (250 mg total) by mouth 2 (two) times daily. Patient not taking: Reported on 06/24/2019 01/05/19   [name], MD  zinc sulfate 220 (50 Zn) MG capsule  Take 220 mg by mouth daily.    [provider]    Allergies Contrast media [iodinated diagnostic agents], Iohexol, Metrizamide, and Latex  Family History  Problem Relation Age of Onset  . Hypertension Mother   . Diabetes Mother   . Prostate cancer Neg Hx   . Bladder Cancer Neg Hx   . Kidney disease Neg Hx     Social History Social History   Tobacco Use  . Smoking status: Former Smoker    Packs/day: 0.25    Types: Cigarettes    Quit date: 10/14/1994    Years since quitting: 25.1  . Smokeless tobacco: Never Used  Vaping Use  . Vaping Use: Never used  Substance Use Topics  . Alcohol use: No    Alcohol/week: 0.0 standard drinks  . Drug use: No    Review of Systems  Constitutional: Negative for fever. + confusion, syncope Respiratory: + difficulty breathing  Unable to obtain remaining due to AMS  ____________________________________________   PHYSICAL EXAM:  VITAL SIGNS: ED Triage Vitals  Enc Vitals Group     BP 11/30/19 0131 (!) 177/104     Pulse Rate 11/30/19 0114 (!) 120     Resp 11/30/19 0114 (!) 35     Temp 11/30/19 0131 (!) 96.5 F (35.8 C)     Temp Source 11/30/19 0131 Axillary     SpO2 11/30/19 0114 (!) 80 %     Weight 11/30/19 0117 177 lb 14.6 oz (80.7 kg)     Height --      Head Circumference --      Peak Flow --      Pain Score 11/30/19 0117 0     Pain Loc --      Pain Edu? --      Excl. in [place]? --     Constitutional: Awake, confused, moving all extremities, moderate respiratory distress HEENT:      Head: Normocephalic and atraumatic.         Eyes: Conjunctivae are normal. Sclera is non-icteric.       Mouth/Throat: Mucous membranes are moist.       Neck: Supple with no signs of meningismus.  No T-spine tenderness Cardiovascular: Irregularly irregular rhythm with tachycardic rate Respiratory: Increased work of breathing, gurgling sounds, decreased breath sounds bilaterally with crackles, hypoxic on room air Gastrointestinal: Soft,  non tender, bruising over the epigastric region Genitourinary: No flank bruising  Musculoskeletal: Bilateral BKA's with no edema. Neurologic:  Face is symmetric. Moving all extremities. Skin: Skin is warm, dry and intact. No rash noted.  ____________________________________________   LABS (all labs ordered are listed, but only abnormal results are displayed)  Labs Reviewed  GLUCOSE, CAPILLARY - Abnormal; Notable for the following components:      Result Value   Glucose-Capillary >600 (*)    All other components within normal limits  CBC WITH DIFFERENTIAL/PLATELET - Abnormal; Notable for the following components:   WBC 21.8 (*)    RBC 6.02 (*)    Hemoglobin 17.6 (*)    HCT 52.3 (*)    Neutro Abs 15.1 (*)    Lymphs Abs 4.4 (*)    Monocytes Absolute 1.4 (*)    Basophils Absolute 0.2 (*)    Abs Immature Granulocytes 0.27 (*)    All other components within normal limits  BASIC METABOLIC PANEL - Abnormal; Notable for the following components:   Glucose, Bld 679 (*)    BUN 28 (*)    Creatinine, Ser 1.72 (*)    Calcium 10.5 (*)    GFR calc non Af Amer 36 (*)    GFR calc Af Amer 42 (*)    Anion gap 16 (*)    All other components within normal limits  BLOOD GAS, VENOUS - Abnormal; Notable for the following components:   Acid-base deficit 2.7 (*)    All other components within normal limits  BRAIN NATRIURETIC PEPTIDE - Abnormal; Notable for the following components:   B Natriuretic Peptide 390.9 (*)    All other components within normal limits  HEPATIC FUNCTION PANEL - Abnormal; Notable for the following components:   AST 43 (*)    Alkaline Phosphatase 172 (*)    Total Bilirubin 1.3 (*)    Bilirubin, Direct 0.5 (*)    All other components within normal limits  GLUCOSE, CAPILLARY - Abnormal; Notable for the following components:   Glucose-Capillary >600 (*)    All other components within normal limits  GLUCOSE, CAPILLARY - Abnormal; Notable for the following components:    Glucose-Capillary >600 (*)    All other components within normal limits  URINALYSIS, COMPLETE (UACMP) WITH MICROSCOPIC - Abnormal; Notable for the following components:   Color, Urine STRAW (*)    APPearance CLEAR (*)    Glucose, UA >=500 (*)    Protein, ur 100 (*)    All other components within normal limits  TROPONIN I (HIGH SENSITIVITY) - Abnormal; Notable for the following components:   Troponin I (High Sensitivity) 39 (*)    All other components within normal limits  SARS CORONAVIRUS 2 BY RT PCR (HOSPITAL ORDER, [place] [facility])  CULTURE, BLOOD (ROUTINE X 2)  CULTURE, BLOOD (ROUTINE X 2)  CULTURE, RESPIRATORY  MAGNESIUM  PROTIME-INR  APTT  LACTIC ACID, PLASMA  LACTIC ACID, PLASMA  PROCALCITONIN  OSMOLALITY  BLOOD GAS, ARTERIAL  BETA-HYDROXYBUTYRIC ACID  TRIGLYCERIDES  TROPONIN I (HIGH SENSITIVITY)   ____________________________________________  EKG  ED ECG REPORT I, Rudene Re, the attending physician, personally viewed and interpreted this ECG.  01:07 -atrial fibrillation with rate of 135, normal QTC, left axis deviation, severe diffuse ST depressions with aVR and anterior elevation.  New when compared to prior.  01:27 -atrial fibrillation with rate of 105, prolonged QTC, persistent severe diffuse ST depressions in inferior lateral leads with elevations in aVR, V1 and V2.  02:43 -atrial fibrillation with rate of 75, prolonged QTC, persistent ST depressions in  inferior lateral leads, resolved ST elevation anterior leads but persistent in aVR. ____________________________________________  RADIOLOGY  I have personally reviewed the images performed during this visit and I agree with the Radiologist's read.   Interpretation by Radiologist:  CT ABDOMEN PELVIS WO CONTRAST  Result Date: 11/30/2019 CLINICAL DATA:  Found down with seizure-like activity, V-tach, seizure while in ED EXAM: CT HEAD WITHOUT CONTRAST CT CERVICAL SPINE WITHOUT  CONTRAST CT CHEST, ABDOMEN AND PELVIS WITHOUT CONTRAST TECHNIQUE: Contiguous axial images were obtained from the base of the skull through the vertex without intravenous contrast. Multidetector CT imaging of the cervical spine was performed without intravenous contrast. Multiplanar CT image reconstructions were also generated. Multidetector CT imaging of the chest, abdomen and pelvis was performed following the standard protocol without IV contrast. COMPARISON:  CT head 09/16/2019, MR 06/05/2018, same day chest radiograph, CT abdomen pelvis 09/17/2006 FINDINGS: CT HEAD FINDINGS Brain: No evidence of acute infarction, hemorrhage, hydrocephalus, extra-axial collection or mass lesion/mass effect. Symmetric prominence of the ventricles, cisterns and sulci compatible with parenchymal volume loss. Patchy areas of white matter hypoattenuation are most compatible with chronic microvascular angiopathy. Vascular: Atherosclerotic calcification of the carotid siphons and intradural vertebral arteries. No hyperdense vessel. Skull: No acute scalp swelling or hematoma. No calvarial fracture or other acute or suspicious osseous lesion. Chronic left occipital scalp infiltration, likely scarring. Small right frontal scalp lipoma, stable. Sinuses/Orbits: Paranasal sinuses and mastoid air cells are predominantly clear. Pneumatization of the petrous apices. Orbital structures are unremarkable aside from prior lens extractions. Other: Extensive debris in the external auditory canals. Mild bilateral TMJ arthrosis. Largely edentulous with mild mandibular prognathism. CT CERVICAL FINDINGS Alignment: Stabilization collar is absent. Mild straightening of the upper cervical lordosis with reversal at the C4-5 levels. Mild retrolisthesis C4 on 5 and C5 on 6 is likely degenerative in nature. No evidence of traumatic listhesis. No abnormally widened, perched or jumped facets. Normal alignment of the craniocervical and atlantoaxial articulations  accounting for leftward cranial rotation. Skull base and vertebrae: No acute skull base fracture. No vertebral fracture or height loss. Arthrosis present at the atlantodental interval with some mild calcific pannus formation. Multilevel spondylitic changes are detailed below. Soft tissues and spinal canal: No pre or paravertebral fluid or swelling. No visible canal hematoma. Enthesopathic changes in the nuchal ligament. Disc levels: Multilevel intervertebral disc height loss with spondylitic endplate changes. Features maximal C4-5 and C5-6 with small posterior disc osteophyte complexes resulting in at most mild canal stenosis. Uncinate spurring facet hypertrophic changes are maximal at these levels as well with moderate to severe stenosis on the left at C5-6 and more moderate changes on the right at C4-5. Additional multilevel foraminal narrowing is mild. Other: Secretions noted in the posterior naso and oropharynx above the level of the inflated endotracheal balloon. Extensive cervical carotid atherosclerosis. No concerning thyroid nodules. CT CHEST FINDINGS Cardiovascular: Limited assessment of the vasculature in the absence of contrast media. Cardiac size within normal limits. Small pericardial effusion. Extensive three-vessel coronary artery disease. Extensive calcifications upon the aortic valve leaflets. Atherosclerotic plaque within the normal caliber aorta. Shared origin of brachiocephalic and left common carotid artery origins. Calcific plaque throughout the proximal great vessels. Central pulmonary arteries are borderline enlarged. Luminal evaluation precluded in the absence of contrast media. Mediastinum/Nodes: No mediastinal fluid or gas. Normal thyroid gland and thoracic inlet. No worrisome mediastinal or axillary adenopathy. Hilar nodal evaluation is limited in the absence of intravenous contrast media. Endotracheal tube terminates in the mid to lower trachea, 3  cm from the carina. Transesophageal tube  tip terminates in the gastric body with side port distal to the GE junction. Lungs/Pleura: Heterogeneous dependent areas of mixed ground-glass and dense consolidative opacities are present in both lungs with multiple fluid-filled dependent airways as well as interlobular septal thickening and fissural thickening and small bilateral pleural effusions with further atelectatic changes and volume loss as well. No pneumothorax. Musculoskeletal: Posterior left third through sixth left rib fractures. Posterior right rib 4 rib fractures. No other acute osseous injuries in the chest wall. Asymmetric sternoclavicular arthrosis, increased on the right. Degenerative changes in both shoulders. Multilevel degenerative changes present spine Multilevel flowing anterior osteophytosis, compatible with features of diffuse idiopathic skeletal hyperostosis (DISH). Mild body wall edema. CT ABDOMEN AND PELVIS FINDINGS Hepatobiliary: No visible hepatic injury or concerning liver lesion on this unenhanced CT. No perihepatic hematoma. Smooth surface contour. Normal attenuation. Patient is post cholecystectomy. Slight prominence of the biliary tree likely related to post cholecystectomy reservoir effect without visible calcified intraductal gallstones. Pancreas: Moderate pancreatic atrophy and partial fatty replacement of the pancreas. No ductal dilatation, discernible lesions, or inflammation. Spleen: No direct splenic injury. No perisplenic hematoma. Normal splenic size. No concerning lesions. Few scattered vascular calcifications. Adrenals/Urinary Tract: Mild lobular thickening of the adrenal gland could reflect some senescent hyperplasia without concerning nodules. No evidence of adrenal hemorrhage. No direct renal injury or perinephric hematoma. Bilateral renal atrophy with mild symmetric perinephric stranding, possibly related to senescent change or diminished renal function. Few vascular calcifications are present in both kidneys.  Collecting system calculus seen in the upper pole right kidney. No obstructive urolithiasis is evident. Hyperdense 18 mm cystic structure arising from the posterior interpolar right kidney (3/72) additional subcentimeter hyperdense cystic structure arising from the upper pole left kidney (3/65). No other concerning renal lesions. Urinary bladder is largely decompressed by Foley catheter at the time of exam and therefore poorly evaluated by CT imaging. Further obscuration by streak artifact from a hip prosthesis. Stomach/Bowel: Transesophageal tube in appropriate position terminating in the gastric body with side port distal to the GE junction as detailed above. Mild tenting of the anterior stomach is a typical finding for placement of a transesophageal tube. Small duodenal lipoma. Small distal duodenal diverticulum (3/69). No small bowel thickening or dilatation. Mild fecalization of the distal small bowel contents can reflect some slowed intestinal transit Appendix is not visualized. No focal inflammation the vicinity of the cecum to suggest an occult appendicitis. No colonic dilatation or wall thickening. Vascular/Lymphatic: Extensive atherosclerotic calcification throughout the abdominal aorta and branch vessels. Luminal evaluation precluded in the absence of contrast media. No aneurysm or ectasia. No periaortic stranding or hemorrhage. No suspicious or enlarged lymph nodes in the included lymphatic chains. Reproductive: In prostate traversed by the Foley catheter. Coarse eccentric calcification of the prostate. No concerning abnormalities of the prostate or seminal vesicles. Nonspecific penile calcifications are noted. Other: No abdominopelvic free air or fluid. No bowel containing hernia. Small fat containing umbilical hernia. No traumatic abdominal wall dehiscence. No body wall or retroperitoneal hematoma. Musculoskeletal: The osseous structures appear diffusely demineralized which may limit detection of small  or nondisplaced fractures. No acute osseous abnormality or suspicious osseous lesion. Extensive streak artifact related to patient's right hip arthroplasty. Multilevel degenerative changes are present in the imaged portions of the spine. Additional degenerative changes in the hips and pelvis. No acute osseous abnormality of the included upper extremities. IMPRESSION: CT HEAD: 1. No acute intracranial abnormality. 2. Stable parenchymal volume loss and  chronic microvascular ischemic changes. 3. Debris in the external auditory canals. CT CERVICAL SPINE: 1. No acute cervical spine fracture or traumatic listhesis. 2. Multilevel degenerative changes of the cervical spine, as described above. CT CHEST, ABDOMEN AND PELVIS 1. Unenhanced CT was performed per clinician order. Lack of IV contrast limits sensitivity and specificity, especially for evaluation of the vasculature and abdominal/pelvic solid viscera. 2. Posterior left third through sixth rib fractures. Posterior right rib 4 rib fractures. Can be seen in the setting of resuscitative efforts. No pneumothorax. 3. Heterogeneous largely dependent areas of mixed ground-glass and dense consolidation in both lungs with fluid-filled dependent airways as well as septal and fissural thickening, and small bilateral pleural effusions as well as features of volume loss. Such findings are worrisome for sequela of aspiration likely with superimposed pulmonary edema and volume loss. Mild contusive changes may also be present in the setting of resuscitation. 4. Endotracheal tube in the mid to lower trachea, 3 cm from the carina. 5. Transesophageal tube in appropriate position terminating in the gastric body with side port distal to the GE junction. 6. No other acute abdominopelvic abnormalities. 7. Nonobstructing right nephrolithiasis. 8. Hyperdense cystic structures arising from the lateral right interpolar kidney and upper pole left kidney., possible hyperdense exophytic cyst.  Recommend outpatient renal ultrasound. 9. Aortic Atherosclerosis (ICD10-I70.0). Electronically Signed   By: Lovena Le M.D.   On: 11/30/2019 02:57   CT Head Wo Contrast  Result Date: 11/30/2019 CLINICAL DATA:  Found down with seizure-like activity, V-tach, seizure while in ED EXAM: CT HEAD WITHOUT CONTRAST CT CERVICAL SPINE WITHOUT CONTRAST CT CHEST, ABDOMEN AND PELVIS WITHOUT CONTRAST TECHNIQUE: Contiguous axial images were obtained from the base of the skull through the vertex without intravenous contrast. Multidetector CT imaging of the cervical spine was performed without intravenous contrast. Multiplanar CT image reconstructions were also generated. Multidetector CT imaging of the chest, abdomen and pelvis was performed following the standard protocol without IV contrast. COMPARISON:  CT head 09/16/2019, MR 06/05/2018, same day chest radiograph, CT abdomen pelvis 09/17/2006 FINDINGS: CT HEAD FINDINGS Brain: No evidence of acute infarction, hemorrhage, hydrocephalus, extra-axial collection or mass lesion/mass effect. Symmetric prominence of the ventricles, cisterns and sulci compatible with parenchymal volume loss. Patchy areas of white matter hypoattenuation are most compatible with chronic microvascular angiopathy. Vascular: Atherosclerotic calcification of the carotid siphons and intradural vertebral arteries. No hyperdense vessel. Skull: No acute scalp swelling or hematoma. No calvarial fracture or other acute or suspicious osseous lesion. Chronic left occipital scalp infiltration, likely scarring. Small right frontal scalp lipoma, stable. Sinuses/Orbits: Paranasal sinuses and mastoid air cells are predominantly clear. Pneumatization of the petrous apices. Orbital structures are unremarkable aside from prior lens extractions. Other: Extensive debris in the external auditory canals. Mild bilateral TMJ arthrosis. Largely edentulous with mild mandibular prognathism. CT CERVICAL FINDINGS Alignment:  Stabilization collar is absent. Mild straightening of the upper cervical lordosis with reversal at the C4-5 levels. Mild retrolisthesis C4 on 5 and C5 on 6 is likely degenerative in nature. No evidence of traumatic listhesis. No abnormally widened, perched or jumped facets. Normal alignment of the craniocervical and atlantoaxial articulations accounting for leftward cranial rotation. Skull base and vertebrae: No acute skull base fracture. No vertebral fracture or height loss. Arthrosis present at the atlantodental interval with some mild calcific pannus formation. Multilevel spondylitic changes are detailed below. Soft tissues and spinal canal: No pre or paravertebral fluid or swelling. No visible canal hematoma. Enthesopathic changes in the nuchal ligament. Disc levels: Multilevel  intervertebral disc height loss with spondylitic endplate changes. Features maximal C4-5 and C5-6 with small posterior disc osteophyte complexes resulting in at most mild canal stenosis. Uncinate spurring facet hypertrophic changes are maximal at these levels as well with moderate to severe stenosis on the left at C5-6 and more moderate changes on the right at C4-5. Additional multilevel foraminal narrowing is mild. Other: Secretions noted in the posterior naso and oropharynx above the level of the inflated endotracheal balloon. Extensive cervical carotid atherosclerosis. No concerning thyroid nodules. CT CHEST FINDINGS Cardiovascular: Limited assessment of the vasculature in the absence of contrast media. Cardiac size within normal limits. Small pericardial effusion. Extensive three-vessel coronary artery disease. Extensive calcifications upon the aortic valve leaflets. Atherosclerotic plaque within the normal caliber aorta. Shared origin of brachiocephalic and left common carotid artery origins. Calcific plaque throughout the proximal great vessels. Central pulmonary arteries are borderline enlarged. Luminal evaluation precluded in the  absence of contrast media. Mediastinum/Nodes: No mediastinal fluid or gas. Normal thyroid gland and thoracic inlet. No worrisome mediastinal or axillary adenopathy. Hilar nodal evaluation is limited in the absence of intravenous contrast media. Endotracheal tube terminates in the mid to lower trachea, 3 cm from the carina. Transesophageal tube tip terminates in the gastric body with side port distal to the GE junction. Lungs/Pleura: Heterogeneous dependent areas of mixed ground-glass and dense consolidative opacities are present in both lungs with multiple fluid-filled dependent airways as well as interlobular septal thickening and fissural thickening and small bilateral pleural effusions with further atelectatic changes and volume loss as well. No pneumothorax. Musculoskeletal: Posterior left third through sixth left rib fractures. Posterior right rib 4 rib fractures. No other acute osseous injuries in the chest wall. Asymmetric sternoclavicular arthrosis, increased on the right. Degenerative changes in both shoulders. Multilevel degenerative changes present spine Multilevel flowing anterior osteophytosis, compatible with features of diffuse idiopathic skeletal hyperostosis (DISH). Mild body wall edema. CT ABDOMEN AND PELVIS FINDINGS Hepatobiliary: No visible hepatic injury or concerning liver lesion on this unenhanced CT. No perihepatic hematoma. Smooth surface contour. Normal attenuation. Patient is post cholecystectomy. Slight prominence of the biliary tree likely related to post cholecystectomy reservoir effect without visible calcified intraductal gallstones. Pancreas: Moderate pancreatic atrophy and partial fatty replacement of the pancreas. No ductal dilatation, discernible lesions, or inflammation. Spleen: No direct splenic injury. No perisplenic hematoma. Normal splenic size. No concerning lesions. Few scattered vascular calcifications. Adrenals/Urinary Tract: Mild lobular thickening of the adrenal gland  could reflect some senescent hyperplasia without concerning nodules. No evidence of adrenal hemorrhage. No direct renal injury or perinephric hematoma. Bilateral renal atrophy with mild symmetric perinephric stranding, possibly related to senescent change or diminished renal function. Few vascular calcifications are present in both kidneys. Collecting system calculus seen in the upper pole right kidney. No obstructive urolithiasis is evident. Hyperdense 18 mm cystic structure arising from the posterior interpolar right kidney (3/72) additional subcentimeter hyperdense cystic structure arising from the upper pole left kidney (3/65). No other concerning renal lesions. Urinary bladder is largely decompressed by Foley catheter at the time of exam and therefore poorly evaluated by CT imaging. Further obscuration by streak artifact from a hip prosthesis. Stomach/Bowel: Transesophageal tube in appropriate position terminating in the gastric body with side port distal to the GE junction as detailed above. Mild tenting of the anterior stomach is a typical finding for placement of a transesophageal tube. Small duodenal lipoma. Small distal duodenal diverticulum (3/69). No small bowel thickening or dilatation. Mild fecalization of the distal small bowel  contents can reflect some slowed intestinal transit Appendix is not visualized. No focal inflammation the vicinity of the cecum to suggest an occult appendicitis. No colonic dilatation or wall thickening. Vascular/Lymphatic: Extensive atherosclerotic calcification throughout the abdominal aorta and branch vessels. Luminal evaluation precluded in the absence of contrast media. No aneurysm or ectasia. No periaortic stranding or hemorrhage. No suspicious or enlarged lymph nodes in the included lymphatic chains. Reproductive: In prostate traversed by the Foley catheter. Coarse eccentric calcification of the prostate. No concerning abnormalities of the prostate or seminal vesicles.  Nonspecific penile calcifications are noted. Other: No abdominopelvic free air or fluid. No bowel containing hernia. Small fat containing umbilical hernia. No traumatic abdominal wall dehiscence. No body wall or retroperitoneal hematoma. Musculoskeletal: The osseous structures appear diffusely demineralized which may limit detection of small or nondisplaced fractures. No acute osseous abnormality or suspicious osseous lesion. Extensive streak artifact related to patient's right hip arthroplasty. Multilevel degenerative changes are present in the imaged portions of the spine. Additional degenerative changes in the hips and pelvis. No acute osseous abnormality of the included upper extremities. IMPRESSION: CT HEAD: 1. No acute intracranial abnormality. 2. Stable parenchymal volume loss and chronic microvascular ischemic changes. 3. Debris in the external auditory canals. CT CERVICAL SPINE: 1. No acute cervical spine fracture or traumatic listhesis. 2. Multilevel degenerative changes of the cervical spine, as described above. CT CHEST, ABDOMEN AND PELVIS 1. Unenhanced CT was performed per clinician order. Lack of IV contrast limits sensitivity and specificity, especially for evaluation of the vasculature and abdominal/pelvic solid viscera. 2. Posterior left third through sixth rib fractures. Posterior right rib 4 rib fractures. Can be seen in the setting of resuscitative efforts. No pneumothorax. 3. Heterogeneous largely dependent areas of mixed ground-glass and dense consolidation in both lungs with fluid-filled dependent airways as well as septal and fissural thickening, and small bilateral pleural effusions as well as features of volume loss. Such findings are worrisome for sequela of aspiration likely with superimposed pulmonary edema and volume loss. Mild contusive changes may also be present in the setting of resuscitation. 4. Endotracheal tube in the mid to lower trachea, 3 cm from the carina. 5. Transesophageal  tube in appropriate position terminating in the gastric body with side port distal to the GE junction. 6. No other acute abdominopelvic abnormalities. 7. Nonobstructing right nephrolithiasis. 8. Hyperdense cystic structures arising from the lateral right interpolar kidney and upper pole left kidney., possible hyperdense exophytic cyst. Recommend outpatient renal ultrasound. 9. Aortic Atherosclerosis (ICD10-I70.0). Electronically Signed   By: Lovena Le M.D.   On: 11/30/2019 02:57   CT Chest Wo Contrast  Result Date: 11/30/2019 CLINICAL DATA:  Found down with seizure-like activity, V-tach, seizure while in ED EXAM: CT HEAD WITHOUT CONTRAST CT CERVICAL SPINE WITHOUT CONTRAST CT CHEST, ABDOMEN AND PELVIS WITHOUT CONTRAST TECHNIQUE: Contiguous axial images were obtained from the base of the skull through the vertex without intravenous contrast. Multidetector CT imaging of the cervical spine was performed without intravenous contrast. Multiplanar CT image reconstructions were also generated. Multidetector CT imaging of the chest, abdomen and pelvis was performed following the standard protocol without IV contrast. COMPARISON:  CT head 09/16/2019, MR 06/05/2018, same day chest radiograph, CT abdomen pelvis 09/17/2006 FINDINGS: CT HEAD FINDINGS Brain: No evidence of acute infarction, hemorrhage, hydrocephalus, extra-axial collection or mass lesion/mass effect. Symmetric prominence of the ventricles, cisterns and sulci compatible with parenchymal volume loss. Patchy areas of white matter hypoattenuation are most compatible with chronic microvascular angiopathy. Vascular: Atherosclerotic calcification  of the carotid siphons and intradural vertebral arteries. No hyperdense vessel. Skull: No acute scalp swelling or hematoma. No calvarial fracture or other acute or suspicious osseous lesion. Chronic left occipital scalp infiltration, likely scarring. Small right frontal scalp lipoma, stable. Sinuses/Orbits: Paranasal  sinuses and mastoid air cells are predominantly clear. Pneumatization of the petrous apices. Orbital structures are unremarkable aside from prior lens extractions. Other: Extensive debris in the external auditory canals. Mild bilateral TMJ arthrosis. Largely edentulous with mild mandibular prognathism. CT CERVICAL FINDINGS Alignment: Stabilization collar is absent. Mild straightening of the upper cervical lordosis with reversal at the C4-5 levels. Mild retrolisthesis C4 on 5 and C5 on 6 is likely degenerative in nature. No evidence of traumatic listhesis. No abnormally widened, perched or jumped facets. Normal alignment of the craniocervical and atlantoaxial articulations accounting for leftward cranial rotation. Skull base and vertebrae: No acute skull base fracture. No vertebral fracture or height loss. Arthrosis present at the atlantodental interval with some mild calcific pannus formation. Multilevel spondylitic changes are detailed below. Soft tissues and spinal canal: No pre or paravertebral fluid or swelling. No visible canal hematoma. Enthesopathic changes in the nuchal ligament. Disc levels: Multilevel intervertebral disc height loss with spondylitic endplate changes. Features maximal C4-5 and C5-6 with small posterior disc osteophyte complexes resulting in at most mild canal stenosis. Uncinate spurring facet hypertrophic changes are maximal at these levels as well with moderate to severe stenosis on the left at C5-6 and more moderate changes on the right at C4-5. Additional multilevel foraminal narrowing is mild. Other: Secretions noted in the posterior naso and oropharynx above the level of the inflated endotracheal balloon. Extensive cervical carotid atherosclerosis. No concerning thyroid nodules. CT CHEST FINDINGS Cardiovascular: Limited assessment of the vasculature in the absence of contrast media. Cardiac size within normal limits. Small pericardial effusion. Extensive three-vessel coronary artery  disease. Extensive calcifications upon the aortic valve leaflets. Atherosclerotic plaque within the normal caliber aorta. Shared origin of brachiocephalic and left common carotid artery origins. Calcific plaque throughout the proximal great vessels. Central pulmonary arteries are borderline enlarged. Luminal evaluation precluded in the absence of contrast media. Mediastinum/Nodes: No mediastinal fluid or gas. Normal thyroid gland and thoracic inlet. No worrisome mediastinal or axillary adenopathy. Hilar nodal evaluation is limited in the absence of intravenous contrast media. Endotracheal tube terminates in the mid to lower trachea, 3 cm from the carina. Transesophageal tube tip terminates in the gastric body with side port distal to the GE junction. Lungs/Pleura: Heterogeneous dependent areas of mixed ground-glass and dense consolidative opacities are present in both lungs with multiple fluid-filled dependent airways as well as interlobular septal thickening and fissural thickening and small bilateral pleural effusions with further atelectatic changes and volume loss as well. No pneumothorax. Musculoskeletal: Posterior left third through sixth left rib fractures. Posterior right rib 4 rib fractures. No other acute osseous injuries in the chest wall. Asymmetric sternoclavicular arthrosis, increased on the right. Degenerative changes in both shoulders. Multilevel degenerative changes present spine Multilevel flowing anterior osteophytosis, compatible with features of diffuse idiopathic skeletal hyperostosis (DISH). Mild body wall edema. CT ABDOMEN AND PELVIS FINDINGS Hepatobiliary: No visible hepatic injury or concerning liver lesion on this unenhanced CT. No perihepatic hematoma. Smooth surface contour. Normal attenuation. Patient is post cholecystectomy. Slight prominence of the biliary tree likely related to post cholecystectomy reservoir effect without visible calcified intraductal gallstones. Pancreas: Moderate  pancreatic atrophy and partial fatty replacement of the pancreas. No ductal dilatation, discernible lesions, or inflammation. Spleen: No direct splenic  injury. No perisplenic hematoma. Normal splenic size. No concerning lesions. Few scattered vascular calcifications. Adrenals/Urinary Tract: Mild lobular thickening of the adrenal gland could reflect some senescent hyperplasia without concerning nodules. No evidence of adrenal hemorrhage. No direct renal injury or perinephric hematoma. Bilateral renal atrophy with mild symmetric perinephric stranding, possibly related to senescent change or diminished renal function. Few vascular calcifications are present in both kidneys. Collecting system calculus seen in the upper pole right kidney. No obstructive urolithiasis is evident. Hyperdense 18 mm cystic structure arising from the posterior interpolar right kidney (3/72) additional subcentimeter hyperdense cystic structure arising from the upper pole left kidney (3/65). No other concerning renal lesions. Urinary bladder is largely decompressed by Foley catheter at the time of exam and therefore poorly evaluated by CT imaging. Further obscuration by streak artifact from a hip prosthesis. Stomach/Bowel: Transesophageal tube in appropriate position terminating in the gastric body with side port distal to the GE junction as detailed above. Mild tenting of the anterior stomach is a typical finding for placement of a transesophageal tube. Small duodenal lipoma. Small distal duodenal diverticulum (3/69). No small bowel thickening or dilatation. Mild fecalization of the distal small bowel contents can reflect some slowed intestinal transit Appendix is not visualized. No focal inflammation the vicinity of the cecum to suggest an occult appendicitis. No colonic dilatation or wall thickening. Vascular/Lymphatic: Extensive atherosclerotic calcification throughout the abdominal aorta and branch vessels. Luminal evaluation precluded in  the absence of contrast media. No aneurysm or ectasia. No periaortic stranding or hemorrhage. No suspicious or enlarged lymph nodes in the included lymphatic chains. Reproductive: In prostate traversed by the Foley catheter. Coarse eccentric calcification of the prostate. No concerning abnormalities of the prostate or seminal vesicles. Nonspecific penile calcifications are noted. Other: No abdominopelvic free air or fluid. No bowel containing hernia. Small fat containing umbilical hernia. No traumatic abdominal wall dehiscence. No body wall or retroperitoneal hematoma. Musculoskeletal: The osseous structures appear diffusely demineralized which may limit detection of small or nondisplaced fractures. No acute osseous abnormality or suspicious osseous lesion. Extensive streak artifact related to patient's right hip arthroplasty. Multilevel degenerative changes are present in the imaged portions of the spine. Additional degenerative changes in the hips and pelvis. No acute osseous abnormality of the included upper extremities. IMPRESSION: CT HEAD: 1. No acute intracranial abnormality. 2. Stable parenchymal volume loss and chronic microvascular ischemic changes. 3. Debris in the external auditory canals. CT CERVICAL SPINE: 1. No acute cervical spine fracture or traumatic listhesis. 2. Multilevel degenerative changes of the cervical spine, as described above. CT CHEST, ABDOMEN AND PELVIS 1. Unenhanced CT was performed per clinician order. Lack of IV contrast limits sensitivity and specificity, especially for evaluation of the vasculature and abdominal/pelvic solid viscera. 2. Posterior left third through sixth rib fractures. Posterior right rib 4 rib fractures. Can be seen in the setting of resuscitative efforts. No pneumothorax. 3. Heterogeneous largely dependent areas of mixed ground-glass and dense consolidation in both lungs with fluid-filled dependent airways as well as septal and fissural thickening, and small  bilateral pleural effusions as well as features of volume loss. Such findings are worrisome for sequela of aspiration likely with superimposed pulmonary edema and volume loss. Mild contusive changes may also be present in the setting of resuscitation. 4. Endotracheal tube in the mid to lower trachea, 3 cm from the carina. 5. Transesophageal tube in appropriate position terminating in the gastric body with side port distal to the GE junction. 6. No other acute abdominopelvic abnormalities. 7.  Nonobstructing right nephrolithiasis. 8. Hyperdense cystic structures arising from the lateral right interpolar kidney and upper pole left kidney., possible hyperdense exophytic cyst. Recommend outpatient renal ultrasound. 9. Aortic Atherosclerosis (ICD10-I70.0). Electronically Signed   By: Lovena Le M.D.   On: 11/30/2019 02:57   CT Cervical Spine Wo Contrast  Result Date: 11/30/2019 CLINICAL DATA:  Found down with seizure-like activity, V-tach, seizure while in ED EXAM: CT HEAD WITHOUT CONTRAST CT CERVICAL SPINE WITHOUT CONTRAST CT CHEST, ABDOMEN AND PELVIS WITHOUT CONTRAST TECHNIQUE: Contiguous axial images were obtained from the base of the skull through the vertex without intravenous contrast. Multidetector CT imaging of the cervical spine was performed without intravenous contrast. Multiplanar CT image reconstructions were also generated. Multidetector CT imaging of the chest, abdomen and pelvis was performed following the standard protocol without IV contrast. COMPARISON:  CT head 09/16/2019, MR 06/05/2018, same day chest radiograph, CT abdomen pelvis 09/17/2006 FINDINGS: CT HEAD FINDINGS Brain: No evidence of acute infarction, hemorrhage, hydrocephalus, extra-axial collection or mass lesion/mass effect. Symmetric prominence of the ventricles, cisterns and sulci compatible with parenchymal volume loss. Patchy areas of white matter hypoattenuation are most compatible with chronic microvascular angiopathy. Vascular:  Atherosclerotic calcification of the carotid siphons and intradural vertebral arteries. No hyperdense vessel. Skull: No acute scalp swelling or hematoma. No calvarial fracture or other acute or suspicious osseous lesion. Chronic left occipital scalp infiltration, likely scarring. Small right frontal scalp lipoma, stable. Sinuses/Orbits: Paranasal sinuses and mastoid air cells are predominantly clear. Pneumatization of the petrous apices. Orbital structures are unremarkable aside from prior lens extractions. Other: Extensive debris in the external auditory canals. Mild bilateral TMJ arthrosis. Largely edentulous with mild mandibular prognathism. CT CERVICAL FINDINGS Alignment: Stabilization collar is absent. Mild straightening of the upper cervical lordosis with reversal at the C4-5 levels. Mild retrolisthesis C4 on 5 and C5 on 6 is likely degenerative in nature. No evidence of traumatic listhesis. No abnormally widened, perched or jumped facets. Normal alignment of the craniocervical and atlantoaxial articulations accounting for leftward cranial rotation. Skull base and vertebrae: No acute skull base fracture. No vertebral fracture or height loss. Arthrosis present at the atlantodental interval with some mild calcific pannus formation. Multilevel spondylitic changes are detailed below. Soft tissues and spinal canal: No pre or paravertebral fluid or swelling. No visible canal hematoma. Enthesopathic changes in the nuchal ligament. Disc levels: Multilevel intervertebral disc height loss with spondylitic endplate changes. Features maximal C4-5 and C5-6 with small posterior disc osteophyte complexes resulting in at most mild canal stenosis. Uncinate spurring facet hypertrophic changes are maximal at these levels as well with moderate to severe stenosis on the left at C5-6 and more moderate changes on the right at C4-5. Additional multilevel foraminal narrowing is mild. Other: Secretions noted in the posterior naso and  oropharynx above the level of the inflated endotracheal balloon. Extensive cervical carotid atherosclerosis. No concerning thyroid nodules. CT CHEST FINDINGS Cardiovascular: Limited assessment of the vasculature in the absence of contrast media. Cardiac size within normal limits. Small pericardial effusion. Extensive three-vessel coronary artery disease. Extensive calcifications upon the aortic valve leaflets. Atherosclerotic plaque within the normal caliber aorta. Shared origin of brachiocephalic and left common carotid artery origins. Calcific plaque throughout the proximal great vessels. Central pulmonary arteries are borderline enlarged. Luminal evaluation precluded in the absence of contrast media. Mediastinum/Nodes: No mediastinal fluid or gas. Normal thyroid gland and thoracic inlet. No worrisome mediastinal or axillary adenopathy. Hilar nodal evaluation is limited in the absence of intravenous contrast media. Endotracheal tube  terminates in the mid to lower trachea, 3 cm from the carina. Transesophageal tube tip terminates in the gastric body with side port distal to the GE junction. Lungs/Pleura: Heterogeneous dependent areas of mixed ground-glass and dense consolidative opacities are present in both lungs with multiple fluid-filled dependent airways as well as interlobular septal thickening and fissural thickening and small bilateral pleural effusions with further atelectatic changes and volume loss as well. No pneumothorax. Musculoskeletal: Posterior left third through sixth left rib fractures. Posterior right rib 4 rib fractures. No other acute osseous injuries in the chest wall. Asymmetric sternoclavicular arthrosis, increased on the right. Degenerative changes in both shoulders. Multilevel degenerative changes present spine Multilevel flowing anterior osteophytosis, compatible with features of diffuse idiopathic skeletal hyperostosis (DISH). Mild body wall edema. CT ABDOMEN AND PELVIS FINDINGS  Hepatobiliary: No visible hepatic injury or concerning liver lesion on this unenhanced CT. No perihepatic hematoma. Smooth surface contour. Normal attenuation. Patient is post cholecystectomy. Slight prominence of the biliary tree likely related to post cholecystectomy reservoir effect without visible calcified intraductal gallstones. Pancreas: Moderate pancreatic atrophy and partial fatty replacement of the pancreas. No ductal dilatation, discernible lesions, or inflammation. Spleen: No direct splenic injury. No perisplenic hematoma. Normal splenic size. No concerning lesions. Few scattered vascular calcifications. Adrenals/Urinary Tract: Mild lobular thickening of the adrenal gland could reflect some senescent hyperplasia without concerning nodules. No evidence of adrenal hemorrhage. No direct renal injury or perinephric hematoma. Bilateral renal atrophy with mild symmetric perinephric stranding, possibly related to senescent change or diminished renal function. Few vascular calcifications are present in both kidneys. Collecting system calculus seen in the upper pole right kidney. No obstructive urolithiasis is evident. Hyperdense 18 mm cystic structure arising from the posterior interpolar right kidney (3/72) additional subcentimeter hyperdense cystic structure arising from the upper pole left kidney (3/65). No other concerning renal lesions. Urinary bladder is largely decompressed by Foley catheter at the time of exam and therefore poorly evaluated by CT imaging. Further obscuration by streak artifact from a hip prosthesis. Stomach/Bowel: Transesophageal tube in appropriate position terminating in the gastric body with side port distal to the GE junction as detailed above. Mild tenting of the anterior stomach is a typical finding for placement of a transesophageal tube. Small duodenal lipoma. Small distal duodenal diverticulum (3/69). No small bowel thickening or dilatation. Mild fecalization of the distal small  bowel contents can reflect some slowed intestinal transit Appendix is not visualized. No focal inflammation the vicinity of the cecum to suggest an occult appendicitis. No colonic dilatation or wall thickening. Vascular/Lymphatic: Extensive atherosclerotic calcification throughout the abdominal aorta and branch vessels. Luminal evaluation precluded in the absence of contrast media. No aneurysm or ectasia. No periaortic stranding or hemorrhage. No suspicious or enlarged lymph nodes in the included lymphatic chains. Reproductive: In prostate traversed by the Foley catheter. Coarse eccentric calcification of the prostate. No concerning abnormalities of the prostate or seminal vesicles. Nonspecific penile calcifications are noted. Other: No abdominopelvic free air or fluid. No bowel containing hernia. Small fat containing umbilical hernia. No traumatic abdominal wall dehiscence. No body wall or retroperitoneal hematoma. Musculoskeletal: The osseous structures appear diffusely demineralized which may limit detection of small or nondisplaced fractures. No acute osseous abnormality or suspicious osseous lesion. Extensive streak artifact related to patient's right hip arthroplasty. Multilevel degenerative changes are present in the imaged portions of the spine. Additional degenerative changes in the hips and pelvis. No acute osseous abnormality of the included upper extremities. IMPRESSION: CT HEAD: 1. No acute intracranial  abnormality. 2. Stable parenchymal volume loss and chronic microvascular ischemic changes. 3. Debris in the external auditory canals. CT CERVICAL SPINE: 1. No acute cervical spine fracture or traumatic listhesis. 2. Multilevel degenerative changes of the cervical spine, as described above. CT CHEST, ABDOMEN AND PELVIS 1. Unenhanced CT was performed per clinician order. Lack of IV contrast limits sensitivity and specificity, especially for evaluation of the vasculature and abdominal/pelvic solid viscera.  2. Posterior left third through sixth rib fractures. Posterior right rib 4 rib fractures. Can be seen in the setting of resuscitative efforts. No pneumothorax. 3. Heterogeneous largely dependent areas of mixed ground-glass and dense consolidation in both lungs with fluid-filled dependent airways as well as septal and fissural thickening, and small bilateral pleural effusions as well as features of volume loss. Such findings are worrisome for sequela of aspiration likely with superimposed pulmonary edema and volume loss. Mild contusive changes may also be present in the setting of resuscitation. 4. Endotracheal tube in the mid to lower trachea, 3 cm from the carina. 5. Transesophageal tube in appropriate position terminating in the gastric body with side port distal to the GE junction. 6. No other acute abdominopelvic abnormalities. 7. Nonobstructing right nephrolithiasis. 8. Hyperdense cystic structures arising from the lateral right interpolar kidney and upper pole left kidney., possible hyperdense exophytic cyst. Recommend outpatient renal ultrasound. 9. Aortic Atherosclerosis (ICD10-I70.0). Electronically Signed   By: Lovena Le M.D.   On: 11/30/2019 02:57   DG Chest Portable 1 View  Result Date: 11/30/2019 CLINICAL DATA:  Shortness of breath EXAM: PORTABLE CHEST 1 VIEW COMPARISON:  09/16/2019 FINDINGS: Mild cardiomegaly. Diffuse interstitial opacity. No pleural effusion or pneumothorax. No focal airspace consolidation. IMPRESSION: Cardiomegaly and mild pulmonary edema. Electronically Signed   By: Ulyses Jarred M.D.   On: 11/30/2019 01:55     ____________________________________________   PROCEDURES  Procedure(s) performed:yes Procedure Name: Intubation Date/Time: 11/30/2019 3:29 AM Performed by: Rudene Re, MD Pre-anesthesia Checklist: Patient identified, Patient being monitored, Emergency Drugs available, Timeout performed and Suction available Oxygen Delivery Method: Non-rebreather  mask Preoxygenation: Pre-oxygenation with 100% oxygen Induction Type: Rapid sequence Ventilation: Mask ventilation without difficulty Laryngoscope Size: Glidescope Tube size: 7.5 mm Number of attempts: 1 Airway Equipment and Method: Video-laryngoscopy Placement Confirmation: ETT inserted through vocal cords under direct vision,  CO2 detector,  Breath sounds checked- equal and bilateral and Positive ETCO2 Secured at: 23 cm Tube secured with: ETT holder      Critical Care performed: yes  CRITICAL CARE Performed by: Rudene Re  ?  Total critical care time: 60 min  Critical care time was exclusive of separately billable procedures and treating other patients.  Critical care was necessary to treat or prevent imminent or life-threatening deterioration.  Critical care was time spent personally by me on the following activities: development of treatment plan with patient and/or surrogate as well as nursing, discussions with consultants, evaluation of patient's response to treatment, examination of patient, obtaining history from patient or surrogate, ordering and performing treatments and interventions, ordering and review of laboratory studies, ordering and review of radiographic studies, pulse oximetry and re-evaluation of patient's condition.  ____________________________________________   INITIAL IMPRESSION / ASSESSMENT AND PLAN / ED COURSE   85 y.o. male history of A. fib on Xarelto, diastolic CHF, CKD, diabetes, bilateral BKA's, peripheral vascular disease, sleep apnea who presents for hyperglycemia and syncope.  Per EMS patient was in his usual state of health, had dinner and while walking around his house had a syncopal event.  Was  found facedown on the floor with severe respiratory distress, altered, and elevated blood glucose.  In route to the ED required versed for agitation. Went into [place] in route and received 150mg  of amiodarone. Upon arrival patient is confused and  slightly agitated however did receive 2 mg of IV Versed in the ambulance.  Sugars are too high to read.  Initial rhythm showing A. fib with diffuse deep ST depressions and elevations in anterior leads and aVR.  Patient also going into runs of V. tach in spite of receiving 150 mg of amiodarone in the ambulance. Initial concern for syncope due to cardiac ischemia/ arrhythmia. I immediately consulted Dr. Fletcher Anon who is on-call for STEMI.  He evaluated the EKGs and determined the patient did not meet criteria for STEMI.  He was concerned about electrolyte derangements in the setting of elevated BG.  He recommended starting patient on heparin and continuing amiodarone as needed, and medical stabilization.  Patient received another bolus of amiodarone and was started on heparin.  Patient also received an amp of bicarb and calcium. Rhythm improved.  Patient with crackles bilaterally, hypoxia on nonrebreather, and pretty significantly elevated blood pressure therefore he was started on BiPAP for concerns of pulmonary edema.  Patient's rhythm stabilized into A. fib with rate in the low 100s, oxygenation improved. VBG showing no signs of acidemia or hypercapnea,. Patient was then witnessed to have a generalized tonic-clonic seizure.  At that time I stop heparin due to concerns of possible head bleed in the setting of a fall on Xarelto and now new seizures. Patient received 2 mg of IV Ativan and was intubated for airway protection.  Patient did have some vomit in his posterior oropharynx.  Due to concerns of aspiration he was started on Unasyn.  Fortunately the CT of the head was negative for bleed and so was the CT of the chest, abdomen and pelvis.  Patient does have left third through sixth rib fractures seen on CT and a posterior right fourth rib fracture. No other signs of traumatic injury.  patient was again restarted on heparin.  At that time he was in A. fib with RVR.  Received 5 mg of IV metoprolol.  Labs showing normal  K and mag.  Did show signs of DKA.  he was started on insulin drip.  Currently his rate has improved and is in the 90s with no further runs of V. tach.  Patient sedated with propofol especially in the setting of new seizures.  Patient with white count of 21.  Now being covered for aspiration pneumonia.  UA negative for UTI.  Initial troponin within patient's baseline.  BNP slightly elevated.  Old medical records reviewed.  Patient discussed with Darlyn Chamber NP ICU for admission.    _________________________ 3:28 AM on 11/30/2019 -----------------------------------------  Patient's son [name] has been updated of patient's critical status over the phone.  _____________________________________________ Please note:  Patient was evaluated in Emergency Department today for the symptoms described in the history of present illness. Patient was evaluated in the context of the global COVID-19 pandemic, which necessitated consideration that the patient might be at risk for infection with the SARS-CoV-2 virus that causes COVID-19. Institutional protocols and algorithms that pertain to the evaluation of patients at risk for COVID-19 are in a state of rapid change based on information released by regulatory bodies including the CDC and federal and state organizations. These policies and algorithms were followed during the patient's care in the ED.  Some ED evaluations and  interventions may be delayed as a result of limited staffing during the pandemic.    Controlled Substance Database was reviewed by me. ____________________________________________   FINAL CLINICAL IMPRESSION(S) / ED DIAGNOSES   Final diagnoses:  Syncope, unspecified syncope type  Seizure ([place])  V-tach ([place])  Diabetic ketoacidosis without coma associated with type 2 diabetes mellitus ([place])  Aspiration pneumonia of both lungs due to gastric secretions, unspecified part of lung ([place])  Closed fracture of multiple ribs of both sides, initial  encounter  Atrial fibrillation with RVR ([place])      NEW MEDICATIONS STARTED DURING THIS VISIT:  ED Discharge Orders    None       Note:  This document was prepared using Dragon voice recognition software and may include unintentional dictation errors.    Alfred Levins, [place], MD 11/30/19 [phone]

## 2019-11-30 NOTE — Progress Notes (Signed)
Inpatient Diabetes Program Recommendations  AACE/ADA: New Consensus Statement on Inpatient Glycemic Control  Target Ranges:  Prepandial:   less than 140 mg/dL      Peak postprandial:   less than 180 mg/dL (1-2 hours)      Critically ill patients:  140 - 180 mg/dL   Results for [name], [name] (MRN [id number]) as of 11/30/2019 08:44  Ref. Range 11/30/2019 01:06 11/30/2019 01:58 11/30/2019 02:26 11/30/2019 03:30 11/30/2019 04:42 11/30/2019 05:27 11/30/2019 06:27 11/30/2019 07:35 11/30/2019 08:04 11/30/2019 08:48  Glucose-Capillary Latest Ref Range: 70 - 99 mg/dL >600 (HH) >600 (HH) >600 (HH) >600 (HH) 537 (HH) 532 (HH) 487 (H) 427 (H) 404 (H) 397 (H)  Results for [name], [name] (MRN [id number]) as of 11/30/2019 08:44  Ref. Range 11/30/2019 01:09  Glucose Latest Ref Range: 70 - 99 mg/dL [facility])   Results for [name], [name] (MRN [id number]) as of 11/30/2019 08:44  Ref. Range 11/30/2019 05:33  Beta-Hydroxybutyric Acid Latest Ref Range: 0.05 - 0.27 mmol/L 0.12   Review of Glycemic Control  Diabetes history: DM2 Outpatient Diabetes medications: Lantus 15 units QHS, Humalog 3 units TID with meals (plus 1 unit for every 50 mg/dl above 200 mg/dl) Current orders for Inpatient glycemic control: IV insulin per EndoTool  Inpatient Diabetes Program Recommendations:    IV insulin: Noted mode of therapy on EndoTool currently ENDOX1 for DKA. Asked RN that mode of therapy be changed to [facility] for HHS since patient is not in DKA. IV insulin should be continued until "Control Criteria Met" message is given which will occur after patient has four CBGs within 140-180 mg/dL.   At time of transition from IV to SQ insulin: Once criteria met and MD is ready to transition patient to SQ insulin, please consider ordering Lantus 12 units Q24H, CBGs Q4H, Novolog 0-15 units Q4H.  NOTE: Noted consult for Diabetes Coordinator. Chart reviewed. Patient is currently on ventilator, has history of DM2 and takes Lantus and Humalog as  noted above per home medication list. Initial lab glucose 679 mg/dl on 11/30/19 and patient is not in DKA. Patient was started on IV insulin per EndoTool. Would ask that mode of therapy on EndoTool be changed to [facility] for HHS. IV insulin should be continued until control criteria met. Once MD is ready to transition patient to SQ insulin, please consider ordering Lantus 12 units Q24H, CBGs Q4H, Novolog 0-15 units Q4H. Will continue to follow along.  Thanks, Barnie Alderman, RN, MSN, CDE Diabetes Coordinator Inpatient Diabetes Program [phone] (Team Pager from 8am to 5pm)

## 2019-11-30 NOTE — Procedures (Addendum)
ELECTROENCEPHALOGRAM REPORT   Patient: [name]       Room #: IC06A-AA EEG No. ID: [id number] Age: 85 y.o.        Sex: male Requesting Physician: Lanney Gins Report Date:  11/30/2019        Interpreting Physician: Alexis Goodell  History: [name] is an 85 y.o. male s/p arrest and seizure  Medications:  Keppra, Unasyn, Insulin, Propofol, Vasopressin  Conditions of Recording:  This is a 21 channel routine scalp EEG performed with bipolar and monopolar montages arranged in accordance to the international 10/20 system of electrode placement. One channel was dedicated to EKG recording.  The patient is in the intubated and sedated state.  Description:  The background activity is discontinuous. It consists of low voltage bursts of polyspike, spike and slow wave activity alternating with periods of attenuation. The burst activity lasts up to 4 seconds.  The periods of attenuation last up to 6 seconds. This discontinuous activity is maintained throughout the tracing.  No activation procedures were performed.   Hyperventilation and intermittent photic stimulation were not performed.   IMPRESSION: This is an abnormal EEG due to a burst-suppression pattern seen throughout the tracing.  Burst suppression pattern can be seen in a variety of circumstances, including anesthesia, drug intoxication, hypothermia, as well as cerebral anoxia.  Clinical/neurological, and radiographic correlation advised.     Alexis Goodell, MD Neurology [phone] 11/30/2019, 4:16 PM

## 2019-11-30 NOTE — Progress Notes (Signed)
[place] visited pt.'s rm. per suggestion of RN for support for wife at bedside.  Pt. intubated and sedated; wife shared pt. was brought to ED yesterday --> aspirated vomit and had to be intubated.  Wife said pt. is diabetic and has had to have leg amputations. CH had extended conversation w/wife about her family.  Wife shared she is youngest girl in family of five girls and one boy, the youngest.  Wife lost her father at age 83, and she shared how this loss affected her and her siblings' lives; she spoke warmly of her mother's care in raising family alone.  Pt. and wife have a son and a dtr. and four grandchildren; they lost a grandson two years ago to a genetic condition; this grandson lived to be 33yrs old despite prognosis of not making it to age 5; 'everybody loved him' wife shared.   Pt. and wife appear to be well supported by family and friends.  Wife grateful for [facility] visit; [place] remains available as needed.    11/30/19 1400  Clinical Encounter Type  Visited With Health care provider;Patient not available;Family  Visit Type Initial;Psychological support;Social support;Critical Care  Referral From Nurse  Spiritual Encounters  Spiritual Needs Emotional  Stress Factors  Family Stress Factors Major life changes;Health changes

## 2019-11-30 NOTE — ED Notes (Signed)
amio 150 bolus now

## 2019-11-30 NOTE — Code Documentation (Signed)
10 units insulin IV at this time

## 2019-11-30 NOTE — ED Notes (Signed)
cbg high

## 2019-11-30 NOTE — ED Triage Notes (Addendum)
Pt to ED via EMS from home. Per ems pt found face down, responsive and combative, family states seizure like actiity prior to arrival. EMS gave 2mg  versed for combativeness. Pt arrives responsive to pain, diaphoretic. Per ems pts rhythm was 120 ST then into 150s Vtach, Pt CBG reading High, 1l fluid given. Respiratory and MD at bedside on arrival. Pts initial rhythm ST on arrival to ED 120s

## 2019-11-30 NOTE — Code Documentation (Signed)
Pt in vtach HR 120

## 2019-11-30 NOTE — Progress Notes (Addendum)
ANTICOAGULATION CONSULT NOTE - Initial Consult  Pharmacy Consult for Heparin Indication: chest pain/ACS  Allergies  Allergen Reactions  . Contrast Media [Iodinated Diagnostic Agents] Shortness Of Breath    SOB  . Iohexol Shortness Of Breath     Desc: Respiratory Distress, Laryngedema, diaphoresis, Onset Date: [id number]   . Metrizamide Shortness Of Breath    SOB SOB SOB   . Latex Rash    Patient Measurements: Weight: 81.4 kg (179 lb 7.3 oz)    Vital Signs: Temp: 97.7 F (36.5 C) (07/09 0444) Temp Source: Bladder (07/09 0444) BP: 134/84 (07/09 0444) Pulse Rate: 103 (07/09 0444)  Labs: Recent Labs    11/30/19 0109 11/30/19 0325  HGB 17.6*  --   HCT 52.3*  --   PLT 241  --   APTT 25  --   LABPROT 13.9  --   INR 1.1  --   CREATININE 1.72*  --   TROPONINIHS 39* 123*    Estimated Creatinine Clearance: 33.8 mL/min (A) (by C-G formula based on SCr of 1.72 mg/dL (H)).   Medical History: Past Medical History:  Diagnosis Date  . Arthritis   . Atrial fibrillation ([place])   . Carcinoma of prostate ([place])   . CHF (congestive heart failure) ([place])   . Chronic kidney disease   . Diabetes mellitus without complication ([place])   . ED (erectile dysfunction)   . Frequent urination   . Hyperlipidemia   . Hypertension   . Moderate mitral insufficiency   . Peripheral vascular disease ([place])   . Pneumonia 04/2016  . Prostate cancer ([place])   . Sleep apnea    OSA--USE C-PAP  . Ulcer of left foot due to type 2 diabetes mellitus ([place])   . Urinary stress incontinence, male     Medications:  Facility-Administered Medications Prior to Admission  Medication Dose Route Frequency Provider Last Rate Last Admin  . diphenhydrAMINE (BENADRYL) injection 50 mg  50 mg Intravenous Once PRN Stegmayer, Kimberly A, PA-C      . famotidine (PEPCID) tablet 40 mg  40 mg Oral Once PRN Stegmayer, Janalyn Harder, PA-C       Medications Prior to Admission  Medication Sig Dispense Refill Last Dose  .  acetaminophen (TYLENOL) 325 MG tablet Take 650 mg by mouth every 6 (six) hours as needed.     Marland Kitchen amiodarone (PACERONE) 200 MG tablet Take 200 mg by mouth daily.     Marland Kitchen aspirin EC 81 MG tablet Take 1 tablet (81 mg total) by mouth daily. 150 tablet 2   . atorvastatin (LIPITOR) 80 MG tablet Take 80 mg by mouth every evening.     . bisacodyl (DULCOLAX) 5 MG EC tablet Take 1 tablet (5 mg total) by mouth daily as needed for moderate constipation. 30 tablet 0   . chlorthalidone (HYGROTON) 25 MG tablet Take 25 mg by mouth daily.     . Cholecalciferol (VITAMIN D-3) 25 MCG (1000 UT) CAPS Take by mouth.     . collagenase (SANTYL) ointment Apply 1 application topically daily. (Patient not taking: Reported on 06/24/2019) 30 g 1   . diltiazem (CARDIZEM CD) 120 MG 24 hr capsule Take 120 mg by mouth daily.     . divalproex (DEPAKOTE ER) 500 MG 24 hr tablet Take 1 tablet (500 mg total) by mouth daily.     Marland Kitchen doxycycline (VIBRAMYCIN) 100 MG capsule Take 100 mg by mouth 2 (two) times daily.     . Ensure Max Protein (ENSURE MAX PROTEIN)  LIQD Take 330 mLs (11 oz total) by mouth 2 (two) times daily.     . ferrous sulfate 325 (65 FE) MG tablet Take 1 tablet (325 mg total) by mouth daily with breakfast. 30 tablet 3   . gabapentin (NEURONTIN) 300 MG capsule Take 300 mg by mouth 3 (three) times daily.      Marland Kitchen HYDROcodone-acetaminophen (NORCO/VICODIN) 5-325 MG tablet Take 1 tablet by mouth every 6 (six) hours as needed for moderate pain (pain score 4-6). (Patient not taking: Reported on 06/24/2019) 10 tablet 0   . Insulin Glargine (BASAGLAR KWIKPEN) 100 UNIT/ML SMARTSIG:15 Unit(s) SUB-Q Every Night     . insulin glargine (LANTUS) 100 UNIT/ML injection Inject 0.15 mLs (15 Units total) into the skin at bedtime. 1 vial 0   . insulin lispro (HUMALOG) 100 UNIT/ML injection Inject 0.03 mLs (3 Units total) into the skin 3 (three) times daily with meals. (Patient taking differently: Inject 3 Units into the skin 3 (three) times daily with  meals. Plus 1 additional unit for every 50 BG points over 200 (max 5 additional units per dose)) 10 mL 0   . isosorbide mononitrate (IMDUR) 60 MG 24 hr tablet Take 1 tablet (60 mg total) by mouth daily. 30 tablet 0   . metFORMIN (GLUCOPHAGE) 500 MG tablet Take by mouth daily.     . metoprolol succinate (TOPROL-XL) 25 MG 24 hr tablet Take 25 mg by mouth daily.     . metoprolol tartrate (LOPRESSOR) 25 MG tablet Take 25 mg by mouth 3 (three) times daily.     . Multiple Vitamin (MULTIVITAMIN WITH MINERALS) TABS tablet Take 1 tablet by mouth daily.     . nitroGLYCERIN (NITROSTAT) 0.4 MG SL tablet Place 1 tablet (0.4 mg total) under the tongue every 5 (five) minutes as needed for chest pain. 20 tablet 0   . NOVOLOG FLEXPEN 100 UNIT/ML FlexPen      . potassium chloride (KLOR-CON) 20 MEQ packet Take by mouth 2 (two) times daily.     . Rivaroxaban (XARELTO) 15 MG TABS tablet Take 1 tablet (15 mg total) by mouth daily with supper. Hold Xarelto for 3 days.  Start on January 09, 2019. 30 tablet 1   . senna (SENOKOT) 8.6 MG TABS tablet Take 1 tablet (8.6 mg total) by mouth daily as needed for mild constipation. 120 tablet 0   . silver sulfADIAZINE (SILVADENE) 1 % cream Apply 1 application topically daily. 50 g 0   . vitamin C (VITAMIN C) 250 MG tablet Take 1 tablet (250 mg total) by mouth 2 (two) times daily. (Patient not taking: Reported on 06/24/2019)     . zinc sulfate 220 (50 Zn) MG capsule Take 220 mg by mouth daily.       Assessment: MD started Heparin infusion at 1000 units/hr in ED. Heparin was started at 0321.  Pt reportedly on Rivaroxaban PTA but baseline coags wnl, last dose not recorded.  Heparin initiated for ACS/STEMI  Goal of Therapy:  Heparin level 0.3-0.7 units/ml Monitor platelets by anticoagulation protocol: Yes   Plan:  Heparin bolus 2000 units x 1 then continue infusion at 1000 units/hr and check HL in 8 hours.  [place] Crane, James Lafalce A 11/30/2019,4:50 AM

## 2019-11-30 NOTE — Progress Notes (Signed)
Pt intubated and sedated responsive to pain throughout shift. Afib converted to NSR. Amio Gtt stopped. Heparin gtt stopped, sq ordered due to chf and poss fluid overload. wife currently at bedside and updated. Will continue to monitor.

## 2019-11-30 NOTE — Progress Notes (Signed)
Pharmacy Antibiotic Note  [name] is a 85 y.o. male admitted on 11/30/2019 with pneumonia.  Pharmacy has been consulted for Unasyn dosing.  Plan: Unasyn 3gm IV q8hrs  Weight: 80.7 kg (177 lb 14.6 oz)  Temp (24hrs), Avg:96.5 F (35.8 C), Min:96.5 F (35.8 C), Max:96.5 F (35.8 C)  Recent Labs  Lab 11/30/19 0109  WBC 21.8*  CREATININE 1.72*    Estimated Creatinine Clearance: 33.7 mL/min (A) (by C-G formula based on SCr of 1.72 mg/dL (H)).    Allergies  Allergen Reactions  . Contrast Media [Iodinated Diagnostic Agents] Shortness Of Breath    SOB  . Iohexol Shortness Of Breath     Desc: Respiratory Distress, Laryngedema, diaphoresis, Onset Date: [id number]   . Metrizamide Shortness Of Breath    SOB SOB SOB   . Latex Rash    Antimicrobials this admission:   >>    >>   Dose adjustments this admission:   Microbiology results:  BCx:   UCx:    Sputum:    MRSA PCR:   Thank you for allowing pharmacy to be a part of this patient's care.  Hart Robinsons A 11/30/2019 3:23 AM

## 2019-11-30 NOTE — ED Notes (Signed)
Heparin 4000 unit bolus at this time

## 2019-11-30 NOTE — Progress Notes (Signed)
eeg completed ° °

## 2019-11-30 NOTE — Progress Notes (Signed)
Initial Nutrition Assessment  DOCUMENTATION CODES:   Not applicable  INTERVENTION:   If tube feeds initiated, recommend:  Vital HP @55ml /hr  Propofol: 14.4 ml/hr- provides 380kcal/day   Free water flushes 29ml q4 hours per MD  Regimen will provide 1700kcal/day, 115g/day protein and 2359ml/day free water   Provide liquid MVI daily via tube   Pt is at refeed risk; recommend monitor K, Mg and P labs daily until stable   NUTRITION DIAGNOSIS:   Inadequate oral intake related to inability to eat (pt sedated and ventilated) as evidenced by NPO status.  GOAL:   Provide needs based on ASPEN/SCCM guidelines  MONITOR:   Vent status, Labs, Weight trends, Skin, I & O's  REASON FOR ASSESSMENT:   Ventilator    ASSESSMENT:   85 year old male with a past medical history significant for atrial fibrillation on Xarelto, HFpEF, CKD, diabetes mellitus, bilateral BKA's, PVD, sleep apnea, and COVID-19 infection in September 2020 who presents to [facility] ED on 11/30/2019 due to syncope and hyperglycemia. Pt found to have hyperosmolar hyperglycemia and aspiration PNA  Pt sedated and ventilated. OGT in place. Wife at bedside reports pt with good appetite and oral intake at baseline. No plans for tube feeds today. Pt remains on insulin CRI.  Per chart, pt appears weight stable pta. Pt is at refeed risk.   Medications reviewed and include: colace, mirialx, NaCl @75ml /hr, albumin, unasyn, pepcid, insulin, propofol, vasopressin  Labs reviewed: BUN 32(H), creat 1.61(H) Wbc- 21.8(H) cbgs- 350, 374, 296, 227, 207 x 24 hrs AIC 11.8(H)  Patient is currently intubated on ventilator support MV: 10.3 L/min Temp (24hrs), Avg:98 F (36.7 C), Min:96.5 F (35.8 C), Max:98.6 F (37 C)  Propofol: 14.4 ml/hr- provides 380kcal/day   MAP- >54mmHg  UOP- 113ml  NUTRITION - FOCUSED PHYSICAL EXAM:    Most Recent Value  Orbital Region No depletion  Upper Arm Region No depletion  Thoracic and Lumbar  Region No depletion  Buccal Region No depletion  Temple Region Mild depletion  Clavicle Bone Region No depletion  Clavicle and Acromion Bone Region No depletion  Scapular Bone Region No depletion  Dorsal Hand No depletion  Patellar Region Mild depletion  Anterior Thigh Region Mild depletion  Posterior Calf Region Mild depletion  Edema (RD Assessment) None  Hair Reviewed  Eyes Reviewed  Mouth Reviewed  Skin Reviewed  Nails Reviewed     Diet Order:   Diet Order            Diet NPO time specified  Diet effective now                EDUCATION NEEDS:   No education needs have been identified at this time  Skin:  Skin Assessment: Reviewed RN Assessment  Last BM:  pta  Height:   Ht Readings from Last 1 Encounters:  11/30/19 5\' 7"  (1.702 m)    Weight:   Wt Readings from Last 1 Encounters:  11/30/19 81.4 kg    Ideal Body Weight:  59.2 kg (adjusted for b/l BKAs)  BMI:  Body mass index is 28.11 kg/m.  Estimated Nutritional Needs:   Kcal:  1704kcal/day  Protein:  100-110g/day  Fluid:  1.5-1.8L/day  Koleen Distance MS, RD, LDN Please refer to [facility] for RD and/or RD on-call/weekend/after hours pager

## 2019-11-30 NOTE — Progress Notes (Signed)
PULMONARY / CRITICAL CARE MEDICINE   Name: [name] MRN: [id number] DOB: 03-06-1935    ADMISSION DATE:  11/30/2019 CONSULTATION DATE:  11/30/2019  REFERRING MD:  Dr. Alfred Levins  CHIEF COMPLAINT:  Tonic-Clonic Seizure, Hyperglycemia  BRIEF DISCUSSION: 85 y.o. Male admitted 11/30/19 with Tonic-Clonic seizure, Acute Hypoxic Hypercapnic Respiratory Failure requiring intubation secondary to Aspiration Pneumonia & Pulmonary Edema, A-fib w/ RVR, intermittent nonsustained V-tach, questionable NSTEMI, and HHS.  Cardiology and Neurology are consulted.  HISTORY OF PRESENT ILLNESS:   [name] is a 85 year old male with a past medical history significant for atrial fibrillation on Xarelto, HFpEF, CKD, diabetes mellitus, bilateral BKA's, PVD, sleep apnea, and COVID-19 infection in September 2020 who presents to [facility] ED on 11/30/2019 due to syncope and hyperglycemia.  Patient is currently intubated and sedated with no family present, therefore history is obtained from ED and nursing notes.  Per notes patient had a syncopal episode this evening, and when EMS arrived they found him face down on the floor very confused (he was noted to have a pulse and spontaneous breathing).  CBG was too high to read.  Patient was combative of which he was given 2 mg of IV Versed.  While in route to the hospital with EMS he went into V. tach and received 150 mg of amiodarone.  Upon arrival to the ED he was very agitated, not answering questions, with increased work of breathing and gurgling respirations.  EKG with atrial fibrillation with diffuse deep ST depressions and elevations and the anterior leads, with intermittent runs of V. tach despite receiving 150 mg of amiodarone in route to the hospital with EMS.  ED provider consulted with Dr. Fletcher Anon of Cardiology who evaluated the EKG and determined the patient did not meet STEMI criteria.  Cardiology recommended starting patient on heparin and continuing amiodarone drip.  Patient was noted to hypoxic with bilateral crackles for which he was subsequently placed on BiPAP.  He was noted to have a  witnessed generalized tonic-clonic seizure which he was given 2 mg of IV Ativan and was intubated for airway protection (noted to have some vomit in his posterior oropharynx during intubation concerning for aspiration).    CT of the head was negative for any acute intracranial process, CT cervical spine is negative, and CT chest/abdomen/pelvis with posterior left rib fractures (no Pneumothorax), aspiration pneumonia, pulmonary edema, good positioning of the ETT, and non-obstructing right Neprholithiasis.  Patient again developed A. fib with RVR which he received IV metoprolol. Lab work shows glucose 679, BUN 28, Creatinine 1.72, anion gap 16, BNP 390, high sensitivity troponin 39, lactic acid 7.1, WBC 21.8 (with Neutrophilia).  His COVID-19 PCR is negative. Urinalysis negative for ketones and negative for UTI.  Given his severely elevated glucose he was placed on insulin drip.  PCCM is asked to admit the pt to ICU for further workup and treatment of Tonic-Clonic seizure, Acute Hypoxic Hypercapnic Respiratory Failure secondary to Aspiration Pneumonia & Pulmonary Edema, A-fib w/ RVR, intermittent nonsustained V-tach, and questionable NSTEMI.  Cardiology and Neurology are consulted.     11/30/19- Patient remains on MV weaning FiO2 to 80%, s/p TTE this AM, plan for EEG tonight. Discussed short term care plan with neurology Dr Doy Mince.  Tracheal aspirate with thick solid chunks of discolored material concerning for aspiration.    PAST MEDICAL HISTORY :  He  has a past medical history of Arthritis, Atrial fibrillation ([place]), Carcinoma of prostate ([place]), CHF (congestive heart failure) ([place]), Chronic kidney disease, Diabetes  mellitus without complication [facility]), ED (erectile dysfunction), Frequent urination, Hyperlipidemia, Hypertension, Moderate mitral insufficiency, Peripheral vascular  disease ([place]), Pneumonia (04/2016), Prostate cancer [facility]), Sleep apnea, Ulcer of left foot due to type 2 diabetes mellitus ([place]), and Urinary stress incontinence, male.  PAST SURGICAL HISTORY: He  has a past surgical history that includes Prostate surgery; Tonsillectomy; Eye surgery (Bilateral); Cholecystectomy; Lower Extremity Angiography (Left, 08/16/2016); Lower Extremity Angiography (Left, 09/01/2016); LOWER EXTREMITY INTERVENTION (09/01/2016); Irrigation and debridement foot (Left, 10/15/2016); Apligraft placement (Left, 10/15/2016); Lower Extremity Angiography (Left, 10/14/2016); Irrigation and debridement foot (Left, 12/09/2016); Lower Extremity Angiography (Left, 12/20/2016); Amputation (Left, 01/12/2017); LEFT HEART CATH AND CORONARY ANGIOGRAPHY (N/A, 06/05/2018); Hip Arthroplasty (Right, 10/13/2018); Lower Extremity Angiography (Right, 12/18/2018); Irrigation and debridement foot (Right, 12/23/2018); Esophagogastroduodenoscopy (egd) with propofol (N/A, 01/02/2019); Colonoscopy with propofol (N/A, 01/02/2019); Colonoscopy with propofol (N/A, 01/05/2019); Amputation (Right, 12/27/2018); and Left Heart Cath (N/A, 06/25/2019).  Allergies  Allergen Reactions  . Contrast Media [Iodinated Diagnostic Agents] Shortness Of Breath    SOB  . Iohexol Shortness Of Breath     Desc: Respiratory Distress, Laryngedema, diaphoresis, Onset Date: [id number]   . Metrizamide Shortness Of Breath    SOB SOB SOB   . Latex Rash    No current facility-administered medications on file prior to encounter.   Current Outpatient Medications on File Prior to Encounter  Medication Sig  . acetaminophen (TYLENOL) 325 MG tablet Take 650 mg by mouth every 6 (six) hours as needed.  Marland Kitchen amiodarone (PACERONE) 200 MG tablet Take 200 mg by mouth daily.  Marland Kitchen aspirin EC 81 MG tablet Take 1 tablet (81 mg total) by mouth daily.  Marland Kitchen atorvastatin (LIPITOR) 80 MG tablet Take 80 mg by mouth every evening.  . bisacodyl (DULCOLAX) 5 MG EC tablet Take 1 tablet  (5 mg total) by mouth daily as needed for moderate constipation.  . chlorthalidone (HYGROTON) 25 MG tablet Take 25 mg by mouth daily.  . Cholecalciferol (VITAMIN D-3) 25 MCG (1000 UT) CAPS Take by mouth.  . collagenase (SANTYL) ointment Apply 1 application topically daily. (Patient not taking: Reported on 06/24/2019)  . diltiazem (CARDIZEM CD) 120 MG 24 hr capsule Take 120 mg by mouth daily.  . divalproex (DEPAKOTE ER) 500 MG 24 hr tablet Take 1 tablet (500 mg total) by mouth daily.  Marland Kitchen doxycycline (VIBRAMYCIN) 100 MG capsule Take 100 mg by mouth 2 (two) times daily.  . Ensure Max Protein (ENSURE MAX PROTEIN) LIQD Take 330 mLs (11 oz total) by mouth 2 (two) times daily.  . ferrous sulfate 325 (65 FE) MG tablet Take 1 tablet (325 mg total) by mouth daily with breakfast.  . gabapentin (NEURONTIN) 300 MG capsule Take 300 mg by mouth 3 (three) times daily.   Marland Kitchen HYDROcodone-acetaminophen (NORCO/VICODIN) 5-325 MG tablet Take 1 tablet by mouth every 6 (six) hours as needed for moderate pain (pain score 4-6). (Patient not taking: Reported on 06/24/2019)  . Insulin Glargine (BASAGLAR KWIKPEN) 100 UNIT/ML SMARTSIG:15 Unit(s) SUB-Q Every Night  . insulin glargine (LANTUS) 100 UNIT/ML injection Inject 0.15 mLs (15 Units total) into the skin at bedtime.  . insulin lispro (HUMALOG) 100 UNIT/ML injection Inject 0.03 mLs (3 Units total) into the skin 3 (three) times daily with meals. (Patient taking differently: Inject 3 Units into the skin 3 (three) times daily with meals. Plus 1 additional unit for every 50 BG points over 200 (max 5 additional units per dose))  . isosorbide mononitrate (IMDUR) 60 MG 24 hr tablet Take 1 tablet (60 mg  total) by mouth daily.  . metFORMIN (GLUCOPHAGE) 500 MG tablet Take by mouth daily.  . metoprolol succinate (TOPROL-XL) 25 MG 24 hr tablet Take 25 mg by mouth daily.  . metoprolol tartrate (LOPRESSOR) 25 MG tablet Take 25 mg by mouth 3 (three) times daily.  . Multiple Vitamin  (MULTIVITAMIN WITH MINERALS) TABS tablet Take 1 tablet by mouth daily.  . nitroGLYCERIN (NITROSTAT) 0.4 MG SL tablet Place 1 tablet (0.4 mg total) under the tongue every 5 (five) minutes as needed for chest pain.  Marland Kitchen NOVOLOG FLEXPEN 100 UNIT/ML FlexPen   . potassium chloride (KLOR-CON) 20 MEQ packet Take by mouth 2 (two) times daily.  . Rivaroxaban (XARELTO) 15 MG TABS tablet Take 1 tablet (15 mg total) by mouth daily with supper. Hold Xarelto for 3 days.  Start on January 09, 2019.  . senna (SENOKOT) 8.6 MG TABS tablet Take 1 tablet (8.6 mg total) by mouth daily as needed for mild constipation.  . silver sulfADIAZINE (SILVADENE) 1 % cream Apply 1 application topically daily.  . vitamin C (VITAMIN C) 250 MG tablet Take 1 tablet (250 mg total) by mouth 2 (two) times daily. (Patient not taking: Reported on 06/24/2019)  . zinc sulfate 220 (50 Zn) MG capsule Take 220 mg by mouth daily.    FAMILY HISTORY:  His He indicated that his mother is deceased. He indicated that his father is deceased. He indicated that the status of his neg hx is unknown.   SOCIAL HISTORY: He  reports that he quit smoking about 25 years ago. His smoking use included cigarettes. He smoked 0.25 packs per day. He has never used smokeless tobacco. He reports that he does not drink alcohol and does not use drugs.    REVIEW OF SYSTEMS:   Unable to assess due to intubation and sedation  SUBJECTIVE:  Unable to assess due to intubation and sedation  VITAL SIGNS: BP 128/66   Pulse 72   Temp 98.1 F (36.7 C)   Resp 20   Ht 5\' 7"  (1.702 m)   Wt 81.4 kg   SpO2 100%   BMI 28.11 kg/m   HEMODYNAMICS:    VENTILATOR SETTINGS: Vent Mode: PRVC FiO2 (%):  [80 %-100 %] 80 % Set Rate:  [18 bmp-22 bmp] 22 bmp Vt Set:  [425 mL-430 mL] 430 mL PEEP:  [10 cmH20] 10 cmH20 Plateau Pressure:  [19 cmH20-21 cmH20] 19 cmH20  INTAKE / OUTPUT: I/O last 3 completed shifts: In: 868.7 [I.V.:368.7; IV Piggyback:500] Out: 125  [Urine:125]  PHYSICAL EXAMINATION: General: Acute on chronically ill-appearing male, laying in bed, intubated and sedated, no acute distressj,GCS4T Neuro: Sedated, withdraws from pain, pupils PERRLA HEENT: Atraumatic, normocephalic, neck supple, no JVD Cardiovascular: Irregular irregular rhythm, no murmurs, rubs, gallops Lungs: Coarse breath sounds bilaterally, even, ventilator assisted Abdomen: Soft, nontender, nondistended, no guarding or rebound tenderness, bowel sounds positive x4 Musculoskeletal: Bilateral BKA's, no edema Skin: Warm and dry.  No obvious rashes, lesions, ulcerations  LABS:  BMET Recent Labs  Lab 11/30/19 0109 11/30/19 0533 11/30/19 0841  NA 137 141 141  K 4.0 3.7 4.0  CL 99 103 102  CO2 22 24 24   BUN 28* 29* 30*  CREATININE 1.72* 1.90* 1.80*  GLUCOSE 679* 559* 448*    Electrolytes Recent Labs  Lab 11/30/19 0109 11/30/19 0533 11/30/19 0841  CALCIUM 10.5* 11.9* 11.0*  MG 2.0  --   --     CBC Recent Labs  Lab 11/30/19 0109  WBC 21.8*  HGB  17.6*  HCT 52.3*  PLT 241    Coag's Recent Labs  Lab 11/30/19 0109  APTT 25  INR 1.1    Sepsis Markers Recent Labs  Lab 11/30/19 0109 11/30/19 0325 11/30/19 0533  LATICACIDVEN 7.1*  --  5.7*  PROCALCITON  --  0.20  --     ABG Recent Labs  Lab 11/30/19 0314  PHART 7.16*  PCO2ART 61*  PO2ART 67*    Liver Enzymes Recent Labs  Lab 11/30/19 0109  AST 43*  ALT 28  ALKPHOS 172*  BILITOT 1.3*  ALBUMIN 3.5    Cardiac Enzymes No results for input(s): TROPONINI, PROBNP in the last 168 hours.  Glucose Recent Labs  Lab 11/30/19 0735 11/30/19 0804 11/30/19 0848 11/30/19 0921 11/30/19 0945 11/30/19 1036  GLUCAP 427* 404* 397* 350* 374* 296*    Imaging CT ABDOMEN PELVIS WO CONTRAST  Result Date: 11/30/2019 CLINICAL DATA:  Found down with seizure-like activity, V-tach, seizure while in ED EXAM: CT HEAD WITHOUT CONTRAST CT CERVICAL SPINE WITHOUT CONTRAST CT CHEST, ABDOMEN AND  PELVIS WITHOUT CONTRAST TECHNIQUE: Contiguous axial images were obtained from the base of the skull through the vertex without intravenous contrast. Multidetector CT imaging of the cervical spine was performed without intravenous contrast. Multiplanar CT image reconstructions were also generated. Multidetector CT imaging of the chest, abdomen and pelvis was performed following the standard protocol without IV contrast. COMPARISON:  CT head 09/16/2019, MR 06/05/2018, same day chest radiograph, CT abdomen pelvis 09/17/2006 FINDINGS: CT HEAD FINDINGS Brain: No evidence of acute infarction, hemorrhage, hydrocephalus, extra-axial collection or mass lesion/mass effect. Symmetric prominence of the ventricles, cisterns and sulci compatible with parenchymal volume loss. Patchy areas of white matter hypoattenuation are most compatible with chronic microvascular angiopathy. Vascular: Atherosclerotic calcification of the carotid siphons and intradural vertebral arteries. No hyperdense vessel. Skull: No acute scalp swelling or hematoma. No calvarial fracture or other acute or suspicious osseous lesion. Chronic left occipital scalp infiltration, likely scarring. Small right frontal scalp lipoma, stable. Sinuses/Orbits: Paranasal sinuses and mastoid air cells are predominantly clear. Pneumatization of the petrous apices. Orbital structures are unremarkable aside from prior lens extractions. Other: Extensive debris in the external auditory canals. Mild bilateral TMJ arthrosis. Largely edentulous with mild mandibular prognathism. CT CERVICAL FINDINGS Alignment: Stabilization collar is absent. Mild straightening of the upper cervical lordosis with reversal at the C4-5 levels. Mild retrolisthesis C4 on 5 and C5 on 6 is likely degenerative in nature. No evidence of traumatic listhesis. No abnormally widened, perched or jumped facets. Normal alignment of the craniocervical and atlantoaxial articulations accounting for leftward cranial  rotation. Skull base and vertebrae: No acute skull base fracture. No vertebral fracture or height loss. Arthrosis present at the atlantodental interval with some mild calcific pannus formation. Multilevel spondylitic changes are detailed below. Soft tissues and spinal canal: No pre or paravertebral fluid or swelling. No visible canal hematoma. Enthesopathic changes in the nuchal ligament. Disc levels: Multilevel intervertebral disc height loss with spondylitic endplate changes. Features maximal C4-5 and C5-6 with small posterior disc osteophyte complexes resulting in at most mild canal stenosis. Uncinate spurring facet hypertrophic changes are maximal at these levels as well with moderate to severe stenosis on the left at C5-6 and more moderate changes on the right at C4-5. Additional multilevel foraminal narrowing is mild. Other: Secretions noted in the posterior naso and oropharynx above the level of the inflated endotracheal balloon. Extensive cervical carotid atherosclerosis. No concerning thyroid nodules. CT CHEST FINDINGS Cardiovascular: Limited assessment of the  vasculature in the absence of contrast media. Cardiac size within normal limits. Small pericardial effusion. Extensive three-vessel coronary artery disease. Extensive calcifications upon the aortic valve leaflets. Atherosclerotic plaque within the normal caliber aorta. Shared origin of brachiocephalic and left common carotid artery origins. Calcific plaque throughout the proximal great vessels. Central pulmonary arteries are borderline enlarged. Luminal evaluation precluded in the absence of contrast media. Mediastinum/Nodes: No mediastinal fluid or gas. Normal thyroid gland and thoracic inlet. No worrisome mediastinal or axillary adenopathy. Hilar nodal evaluation is limited in the absence of intravenous contrast media. Endotracheal tube terminates in the mid to lower trachea, 3 cm from the carina. Transesophageal tube tip terminates in the gastric  body with side port distal to the GE junction. Lungs/Pleura: Heterogeneous dependent areas of mixed ground-glass and dense consolidative opacities are present in both lungs with multiple fluid-filled dependent airways as well as interlobular septal thickening and fissural thickening and small bilateral pleural effusions with further atelectatic changes and volume loss as well. No pneumothorax. Musculoskeletal: Posterior left third through sixth left rib fractures. Posterior right rib 4 rib fractures. No other acute osseous injuries in the chest wall. Asymmetric sternoclavicular arthrosis, increased on the right. Degenerative changes in both shoulders. Multilevel degenerative changes present spine Multilevel flowing anterior osteophytosis, compatible with features of diffuse idiopathic skeletal hyperostosis (DISH). Mild body wall edema. CT ABDOMEN AND PELVIS FINDINGS Hepatobiliary: No visible hepatic injury or concerning liver lesion on this unenhanced CT. No perihepatic hematoma. Smooth surface contour. Normal attenuation. Patient is post cholecystectomy. Slight prominence of the biliary tree likely related to post cholecystectomy reservoir effect without visible calcified intraductal gallstones. Pancreas: Moderate pancreatic atrophy and partial fatty replacement of the pancreas. No ductal dilatation, discernible lesions, or inflammation. Spleen: No direct splenic injury. No perisplenic hematoma. Normal splenic size. No concerning lesions. Few scattered vascular calcifications. Adrenals/Urinary Tract: Mild lobular thickening of the adrenal gland could reflect some senescent hyperplasia without concerning nodules. No evidence of adrenal hemorrhage. No direct renal injury or perinephric hematoma. Bilateral renal atrophy with mild symmetric perinephric stranding, possibly related to senescent change or diminished renal function. Few vascular calcifications are present in both kidneys. Collecting system calculus seen in  the upper pole right kidney. No obstructive urolithiasis is evident. Hyperdense 18 mm cystic structure arising from the posterior interpolar right kidney (3/72) additional subcentimeter hyperdense cystic structure arising from the upper pole left kidney (3/65). No other concerning renal lesions. Urinary bladder is largely decompressed by Foley catheter at the time of exam and therefore poorly evaluated by CT imaging. Further obscuration by streak artifact from a hip prosthesis. Stomach/Bowel: Transesophageal tube in appropriate position terminating in the gastric body with side port distal to the GE junction as detailed above. Mild tenting of the anterior stomach is a typical finding for placement of a transesophageal tube. Small duodenal lipoma. Small distal duodenal diverticulum (3/69). No small bowel thickening or dilatation. Mild fecalization of the distal small bowel contents can reflect some slowed intestinal transit Appendix is not visualized. No focal inflammation the vicinity of the cecum to suggest an occult appendicitis. No colonic dilatation or wall thickening. Vascular/Lymphatic: Extensive atherosclerotic calcification throughout the abdominal aorta and branch vessels. Luminal evaluation precluded in the absence of contrast media. No aneurysm or ectasia. No periaortic stranding or hemorrhage. No suspicious or enlarged lymph nodes in the included lymphatic chains. Reproductive: In prostate traversed by the Foley catheter. Coarse eccentric calcification of the prostate. No concerning abnormalities of the prostate or seminal vesicles. Nonspecific penile calcifications  are noted. Other: No abdominopelvic free air or fluid. No bowel containing hernia. Small fat containing umbilical hernia. No traumatic abdominal wall dehiscence. No body wall or retroperitoneal hematoma. Musculoskeletal: The osseous structures appear diffusely demineralized which may limit detection of small or nondisplaced fractures. No  acute osseous abnormality or suspicious osseous lesion. Extensive streak artifact related to patient's right hip arthroplasty. Multilevel degenerative changes are present in the imaged portions of the spine. Additional degenerative changes in the hips and pelvis. No acute osseous abnormality of the included upper extremities. IMPRESSION: CT HEAD: 1. No acute intracranial abnormality. 2. Stable parenchymal volume loss and chronic microvascular ischemic changes. 3. Debris in the external auditory canals. CT CERVICAL SPINE: 1. No acute cervical spine fracture or traumatic listhesis. 2. Multilevel degenerative changes of the cervical spine, as described above. CT CHEST, ABDOMEN AND PELVIS 1. Unenhanced CT was performed per clinician order. Lack of IV contrast limits sensitivity and specificity, especially for evaluation of the vasculature and abdominal/pelvic solid viscera. 2. Posterior left third through sixth rib fractures. Posterior right rib 4 rib fractures. Can be seen in the setting of resuscitative efforts. No pneumothorax. 3. Heterogeneous largely dependent areas of mixed ground-glass and dense consolidation in both lungs with fluid-filled dependent airways as well as septal and fissural thickening, and small bilateral pleural effusions as well as features of volume loss. Such findings are worrisome for sequela of aspiration likely with superimposed pulmonary edema and volume loss. Mild contusive changes may also be present in the setting of resuscitation. 4. Endotracheal tube in the mid to lower trachea, 3 cm from the carina. 5. Transesophageal tube in appropriate position terminating in the gastric body with side port distal to the GE junction. 6. No other acute abdominopelvic abnormalities. 7. Nonobstructing right nephrolithiasis. 8. Hyperdense cystic structures arising from the lateral right interpolar kidney and upper pole left kidney., possible hyperdense exophytic cyst. Recommend outpatient renal  ultrasound. 9. Aortic Atherosclerosis (ICD10-I70.0). Electronically Signed   By: Lovena Le M.D.   On: 11/30/2019 02:57   CT Head Wo Contrast  Result Date: 11/30/2019 CLINICAL DATA:  Found down with seizure-like activity, V-tach, seizure while in ED EXAM: CT HEAD WITHOUT CONTRAST CT CERVICAL SPINE WITHOUT CONTRAST CT CHEST, ABDOMEN AND PELVIS WITHOUT CONTRAST TECHNIQUE: Contiguous axial images were obtained from the base of the skull through the vertex without intravenous contrast. Multidetector CT imaging of the cervical spine was performed without intravenous contrast. Multiplanar CT image reconstructions were also generated. Multidetector CT imaging of the chest, abdomen and pelvis was performed following the standard protocol without IV contrast. COMPARISON:  CT head 09/16/2019, MR 06/05/2018, same day chest radiograph, CT abdomen pelvis 09/17/2006 FINDINGS: CT HEAD FINDINGS Brain: No evidence of acute infarction, hemorrhage, hydrocephalus, extra-axial collection or mass lesion/mass effect. Symmetric prominence of the ventricles, cisterns and sulci compatible with parenchymal volume loss. Patchy areas of white matter hypoattenuation are most compatible with chronic microvascular angiopathy. Vascular: Atherosclerotic calcification of the carotid siphons and intradural vertebral arteries. No hyperdense vessel. Skull: No acute scalp swelling or hematoma. No calvarial fracture or other acute or suspicious osseous lesion. Chronic left occipital scalp infiltration, likely scarring. Small right frontal scalp lipoma, stable. Sinuses/Orbits: Paranasal sinuses and mastoid air cells are predominantly clear. Pneumatization of the petrous apices. Orbital structures are unremarkable aside from prior lens extractions. Other: Extensive debris in the external auditory canals. Mild bilateral TMJ arthrosis. Largely edentulous with mild mandibular prognathism. CT CERVICAL FINDINGS Alignment: Stabilization collar is absent.  Mild straightening of the  upper cervical lordosis with reversal at the C4-5 levels. Mild retrolisthesis C4 on 5 and C5 on 6 is likely degenerative in nature. No evidence of traumatic listhesis. No abnormally widened, perched or jumped facets. Normal alignment of the craniocervical and atlantoaxial articulations accounting for leftward cranial rotation. Skull base and vertebrae: No acute skull base fracture. No vertebral fracture or height loss. Arthrosis present at the atlantodental interval with some mild calcific pannus formation. Multilevel spondylitic changes are detailed below. Soft tissues and spinal canal: No pre or paravertebral fluid or swelling. No visible canal hematoma. Enthesopathic changes in the nuchal ligament. Disc levels: Multilevel intervertebral disc height loss with spondylitic endplate changes. Features maximal C4-5 and C5-6 with small posterior disc osteophyte complexes resulting in at most mild canal stenosis. Uncinate spurring facet hypertrophic changes are maximal at these levels as well with moderate to severe stenosis on the left at C5-6 and more moderate changes on the right at C4-5. Additional multilevel foraminal narrowing is mild. Other: Secretions noted in the posterior naso and oropharynx above the level of the inflated endotracheal balloon. Extensive cervical carotid atherosclerosis. No concerning thyroid nodules. CT CHEST FINDINGS Cardiovascular: Limited assessment of the vasculature in the absence of contrast media. Cardiac size within normal limits. Small pericardial effusion. Extensive three-vessel coronary artery disease. Extensive calcifications upon the aortic valve leaflets. Atherosclerotic plaque within the normal caliber aorta. Shared origin of brachiocephalic and left common carotid artery origins. Calcific plaque throughout the proximal great vessels. Central pulmonary arteries are borderline enlarged. Luminal evaluation precluded in the absence of contrast media.  Mediastinum/Nodes: No mediastinal fluid or gas. Normal thyroid gland and thoracic inlet. No worrisome mediastinal or axillary adenopathy. Hilar nodal evaluation is limited in the absence of intravenous contrast media. Endotracheal tube terminates in the mid to lower trachea, 3 cm from the carina. Transesophageal tube tip terminates in the gastric body with side port distal to the GE junction. Lungs/Pleura: Heterogeneous dependent areas of mixed ground-glass and dense consolidative opacities are present in both lungs with multiple fluid-filled dependent airways as well as interlobular septal thickening and fissural thickening and small bilateral pleural effusions with further atelectatic changes and volume loss as well. No pneumothorax. Musculoskeletal: Posterior left third through sixth left rib fractures. Posterior right rib 4 rib fractures. No other acute osseous injuries in the chest wall. Asymmetric sternoclavicular arthrosis, increased on the right. Degenerative changes in both shoulders. Multilevel degenerative changes present spine Multilevel flowing anterior osteophytosis, compatible with features of diffuse idiopathic skeletal hyperostosis (DISH). Mild body wall edema. CT ABDOMEN AND PELVIS FINDINGS Hepatobiliary: No visible hepatic injury or concerning liver lesion on this unenhanced CT. No perihepatic hematoma. Smooth surface contour. Normal attenuation. Patient is post cholecystectomy. Slight prominence of the biliary tree likely related to post cholecystectomy reservoir effect without visible calcified intraductal gallstones. Pancreas: Moderate pancreatic atrophy and partial fatty replacement of the pancreas. No ductal dilatation, discernible lesions, or inflammation. Spleen: No direct splenic injury. No perisplenic hematoma. Normal splenic size. No concerning lesions. Few scattered vascular calcifications. Adrenals/Urinary Tract: Mild lobular thickening of the adrenal gland could reflect some senescent  hyperplasia without concerning nodules. No evidence of adrenal hemorrhage. No direct renal injury or perinephric hematoma. Bilateral renal atrophy with mild symmetric perinephric stranding, possibly related to senescent change or diminished renal function. Few vascular calcifications are present in both kidneys. Collecting system calculus seen in the upper pole right kidney. No obstructive urolithiasis is evident. Hyperdense 18 mm cystic structure arising from the posterior interpolar right kidney (3/72)  additional subcentimeter hyperdense cystic structure arising from the upper pole left kidney (3/65). No other concerning renal lesions. Urinary bladder is largely decompressed by Foley catheter at the time of exam and therefore poorly evaluated by CT imaging. Further obscuration by streak artifact from a hip prosthesis. Stomach/Bowel: Transesophageal tube in appropriate position terminating in the gastric body with side port distal to the GE junction as detailed above. Mild tenting of the anterior stomach is a typical finding for placement of a transesophageal tube. Small duodenal lipoma. Small distal duodenal diverticulum (3/69). No small bowel thickening or dilatation. Mild fecalization of the distal small bowel contents can reflect some slowed intestinal transit Appendix is not visualized. No focal inflammation the vicinity of the cecum to suggest an occult appendicitis. No colonic dilatation or wall thickening. Vascular/Lymphatic: Extensive atherosclerotic calcification throughout the abdominal aorta and branch vessels. Luminal evaluation precluded in the absence of contrast media. No aneurysm or ectasia. No periaortic stranding or hemorrhage. No suspicious or enlarged lymph nodes in the included lymphatic chains. Reproductive: In prostate traversed by the Foley catheter. Coarse eccentric calcification of the prostate. No concerning abnormalities of the prostate or seminal vesicles. Nonspecific penile  calcifications are noted. Other: No abdominopelvic free air or fluid. No bowel containing hernia. Small fat containing umbilical hernia. No traumatic abdominal wall dehiscence. No body wall or retroperitoneal hematoma. Musculoskeletal: The osseous structures appear diffusely demineralized which may limit detection of small or nondisplaced fractures. No acute osseous abnormality or suspicious osseous lesion. Extensive streak artifact related to patient's right hip arthroplasty. Multilevel degenerative changes are present in the imaged portions of the spine. Additional degenerative changes in the hips and pelvis. No acute osseous abnormality of the included upper extremities. IMPRESSION: CT HEAD: 1. No acute intracranial abnormality. 2. Stable parenchymal volume loss and chronic microvascular ischemic changes. 3. Debris in the external auditory canals. CT CERVICAL SPINE: 1. No acute cervical spine fracture or traumatic listhesis. 2. Multilevel degenerative changes of the cervical spine, as described above. CT CHEST, ABDOMEN AND PELVIS 1. Unenhanced CT was performed per clinician order. Lack of IV contrast limits sensitivity and specificity, especially for evaluation of the vasculature and abdominal/pelvic solid viscera. 2. Posterior left third through sixth rib fractures. Posterior right rib 4 rib fractures. Can be seen in the setting of resuscitative efforts. No pneumothorax. 3. Heterogeneous largely dependent areas of mixed ground-glass and dense consolidation in both lungs with fluid-filled dependent airways as well as septal and fissural thickening, and small bilateral pleural effusions as well as features of volume loss. Such findings are worrisome for sequela of aspiration likely with superimposed pulmonary edema and volume loss. Mild contusive changes may also be present in the setting of resuscitation. 4. Endotracheal tube in the mid to lower trachea, 3 cm from the carina. 5. Transesophageal tube in  appropriate position terminating in the gastric body with side port distal to the GE junction. 6. No other acute abdominopelvic abnormalities. 7. Nonobstructing right nephrolithiasis. 8. Hyperdense cystic structures arising from the lateral right interpolar kidney and upper pole left kidney., possible hyperdense exophytic cyst. Recommend outpatient renal ultrasound. 9. Aortic Atherosclerosis (ICD10-I70.0). Electronically Signed   By: Lovena Le M.D.   On: 11/30/2019 02:57   CT Chest Wo Contrast  Result Date: 11/30/2019 CLINICAL DATA:  Found down with seizure-like activity, V-tach, seizure while in ED EXAM: CT HEAD WITHOUT CONTRAST CT CERVICAL SPINE WITHOUT CONTRAST CT CHEST, ABDOMEN AND PELVIS WITHOUT CONTRAST TECHNIQUE: Contiguous axial images were obtained from the base of the  skull through the vertex without intravenous contrast. Multidetector CT imaging of the cervical spine was performed without intravenous contrast. Multiplanar CT image reconstructions were also generated. Multidetector CT imaging of the chest, abdomen and pelvis was performed following the standard protocol without IV contrast. COMPARISON:  CT head 09/16/2019, MR 06/05/2018, same day chest radiograph, CT abdomen pelvis 09/17/2006 FINDINGS: CT HEAD FINDINGS Brain: No evidence of acute infarction, hemorrhage, hydrocephalus, extra-axial collection or mass lesion/mass effect. Symmetric prominence of the ventricles, cisterns and sulci compatible with parenchymal volume loss. Patchy areas of white matter hypoattenuation are most compatible with chronic microvascular angiopathy. Vascular: Atherosclerotic calcification of the carotid siphons and intradural vertebral arteries. No hyperdense vessel. Skull: No acute scalp swelling or hematoma. No calvarial fracture or other acute or suspicious osseous lesion. Chronic left occipital scalp infiltration, likely scarring. Small right frontal scalp lipoma, stable. Sinuses/Orbits: Paranasal sinuses and  mastoid air cells are predominantly clear. Pneumatization of the petrous apices. Orbital structures are unremarkable aside from prior lens extractions. Other: Extensive debris in the external auditory canals. Mild bilateral TMJ arthrosis. Largely edentulous with mild mandibular prognathism. CT CERVICAL FINDINGS Alignment: Stabilization collar is absent. Mild straightening of the upper cervical lordosis with reversal at the C4-5 levels. Mild retrolisthesis C4 on 5 and C5 on 6 is likely degenerative in nature. No evidence of traumatic listhesis. No abnormally widened, perched or jumped facets. Normal alignment of the craniocervical and atlantoaxial articulations accounting for leftward cranial rotation. Skull base and vertebrae: No acute skull base fracture. No vertebral fracture or height loss. Arthrosis present at the atlantodental interval with some mild calcific pannus formation. Multilevel spondylitic changes are detailed below. Soft tissues and spinal canal: No pre or paravertebral fluid or swelling. No visible canal hematoma. Enthesopathic changes in the nuchal ligament. Disc levels: Multilevel intervertebral disc height loss with spondylitic endplate changes. Features maximal C4-5 and C5-6 with small posterior disc osteophyte complexes resulting in at most mild canal stenosis. Uncinate spurring facet hypertrophic changes are maximal at these levels as well with moderate to severe stenosis on the left at C5-6 and more moderate changes on the right at C4-5. Additional multilevel foraminal narrowing is mild. Other: Secretions noted in the posterior naso and oropharynx above the level of the inflated endotracheal balloon. Extensive cervical carotid atherosclerosis. No concerning thyroid nodules. CT CHEST FINDINGS Cardiovascular: Limited assessment of the vasculature in the absence of contrast media. Cardiac size within normal limits. Small pericardial effusion. Extensive three-vessel coronary artery disease.  Extensive calcifications upon the aortic valve leaflets. Atherosclerotic plaque within the normal caliber aorta. Shared origin of brachiocephalic and left common carotid artery origins. Calcific plaque throughout the proximal great vessels. Central pulmonary arteries are borderline enlarged. Luminal evaluation precluded in the absence of contrast media. Mediastinum/Nodes: No mediastinal fluid or gas. Normal thyroid gland and thoracic inlet. No worrisome mediastinal or axillary adenopathy. Hilar nodal evaluation is limited in the absence of intravenous contrast media. Endotracheal tube terminates in the mid to lower trachea, 3 cm from the carina. Transesophageal tube tip terminates in the gastric body with side port distal to the GE junction. Lungs/Pleura: Heterogeneous dependent areas of mixed ground-glass and dense consolidative opacities are present in both lungs with multiple fluid-filled dependent airways as well as interlobular septal thickening and fissural thickening and small bilateral pleural effusions with further atelectatic changes and volume loss as well. No pneumothorax. Musculoskeletal: Posterior left third through sixth left rib fractures. Posterior right rib 4 rib fractures. No other acute osseous injuries in the chest  wall. Asymmetric sternoclavicular arthrosis, increased on the right. Degenerative changes in both shoulders. Multilevel degenerative changes present spine Multilevel flowing anterior osteophytosis, compatible with features of diffuse idiopathic skeletal hyperostosis (DISH). Mild body wall edema. CT ABDOMEN AND PELVIS FINDINGS Hepatobiliary: No visible hepatic injury or concerning liver lesion on this unenhanced CT. No perihepatic hematoma. Smooth surface contour. Normal attenuation. Patient is post cholecystectomy. Slight prominence of the biliary tree likely related to post cholecystectomy reservoir effect without visible calcified intraductal gallstones. Pancreas: Moderate  pancreatic atrophy and partial fatty replacement of the pancreas. No ductal dilatation, discernible lesions, or inflammation. Spleen: No direct splenic injury. No perisplenic hematoma. Normal splenic size. No concerning lesions. Few scattered vascular calcifications. Adrenals/Urinary Tract: Mild lobular thickening of the adrenal gland could reflect some senescent hyperplasia without concerning nodules. No evidence of adrenal hemorrhage. No direct renal injury or perinephric hematoma. Bilateral renal atrophy with mild symmetric perinephric stranding, possibly related to senescent change or diminished renal function. Few vascular calcifications are present in both kidneys. Collecting system calculus seen in the upper pole right kidney. No obstructive urolithiasis is evident. Hyperdense 18 mm cystic structure arising from the posterior interpolar right kidney (3/72) additional subcentimeter hyperdense cystic structure arising from the upper pole left kidney (3/65). No other concerning renal lesions. Urinary bladder is largely decompressed by Foley catheter at the time of exam and therefore poorly evaluated by CT imaging. Further obscuration by streak artifact from a hip prosthesis. Stomach/Bowel: Transesophageal tube in appropriate position terminating in the gastric body with side port distal to the GE junction as detailed above. Mild tenting of the anterior stomach is a typical finding for placement of a transesophageal tube. Small duodenal lipoma. Small distal duodenal diverticulum (3/69). No small bowel thickening or dilatation. Mild fecalization of the distal small bowel contents can reflect some slowed intestinal transit Appendix is not visualized. No focal inflammation the vicinity of the cecum to suggest an occult appendicitis. No colonic dilatation or wall thickening. Vascular/Lymphatic: Extensive atherosclerotic calcification throughout the abdominal aorta and branch vessels. Luminal evaluation precluded in  the absence of contrast media. No aneurysm or ectasia. No periaortic stranding or hemorrhage. No suspicious or enlarged lymph nodes in the included lymphatic chains. Reproductive: In prostate traversed by the Foley catheter. Coarse eccentric calcification of the prostate. No concerning abnormalities of the prostate or seminal vesicles. Nonspecific penile calcifications are noted. Other: No abdominopelvic free air or fluid. No bowel containing hernia. Small fat containing umbilical hernia. No traumatic abdominal wall dehiscence. No body wall or retroperitoneal hematoma. Musculoskeletal: The osseous structures appear diffusely demineralized which may limit detection of small or nondisplaced fractures. No acute osseous abnormality or suspicious osseous lesion. Extensive streak artifact related to patient's right hip arthroplasty. Multilevel degenerative changes are present in the imaged portions of the spine. Additional degenerative changes in the hips and pelvis. No acute osseous abnormality of the included upper extremities. IMPRESSION: CT HEAD: 1. No acute intracranial abnormality. 2. Stable parenchymal volume loss and chronic microvascular ischemic changes. 3. Debris in the external auditory canals. CT CERVICAL SPINE: 1. No acute cervical spine fracture or traumatic listhesis. 2. Multilevel degenerative changes of the cervical spine, as described above. CT CHEST, ABDOMEN AND PELVIS 1. Unenhanced CT was performed per clinician order. Lack of IV contrast limits sensitivity and specificity, especially for evaluation of the vasculature and abdominal/pelvic solid viscera. 2. Posterior left third through sixth rib fractures. Posterior right rib 4 rib fractures. Can be seen in the setting of resuscitative efforts. No pneumothorax.  3. Heterogeneous largely dependent areas of mixed ground-glass and dense consolidation in both lungs with fluid-filled dependent airways as well as septal and fissural thickening, and small  bilateral pleural effusions as well as features of volume loss. Such findings are worrisome for sequela of aspiration likely with superimposed pulmonary edema and volume loss. Mild contusive changes may also be present in the setting of resuscitation. 4. Endotracheal tube in the mid to lower trachea, 3 cm from the carina. 5. Transesophageal tube in appropriate position terminating in the gastric body with side port distal to the GE junction. 6. No other acute abdominopelvic abnormalities. 7. Nonobstructing right nephrolithiasis. 8. Hyperdense cystic structures arising from the lateral right interpolar kidney and upper pole left kidney., possible hyperdense exophytic cyst. Recommend outpatient renal ultrasound. 9. Aortic Atherosclerosis (ICD10-I70.0). Electronically Signed   By: Lovena Le M.D.   On: 11/30/2019 02:57   CT Cervical Spine Wo Contrast  Result Date: 11/30/2019 CLINICAL DATA:  Found down with seizure-like activity, V-tach, seizure while in ED EXAM: CT HEAD WITHOUT CONTRAST CT CERVICAL SPINE WITHOUT CONTRAST CT CHEST, ABDOMEN AND PELVIS WITHOUT CONTRAST TECHNIQUE: Contiguous axial images were obtained from the base of the skull through the vertex without intravenous contrast. Multidetector CT imaging of the cervical spine was performed without intravenous contrast. Multiplanar CT image reconstructions were also generated. Multidetector CT imaging of the chest, abdomen and pelvis was performed following the standard protocol without IV contrast. COMPARISON:  CT head 09/16/2019, MR 06/05/2018, same day chest radiograph, CT abdomen pelvis 09/17/2006 FINDINGS: CT HEAD FINDINGS Brain: No evidence of acute infarction, hemorrhage, hydrocephalus, extra-axial collection or mass lesion/mass effect. Symmetric prominence of the ventricles, cisterns and sulci compatible with parenchymal volume loss. Patchy areas of white matter hypoattenuation are most compatible with chronic microvascular angiopathy. Vascular:  Atherosclerotic calcification of the carotid siphons and intradural vertebral arteries. No hyperdense vessel. Skull: No acute scalp swelling or hematoma. No calvarial fracture or other acute or suspicious osseous lesion. Chronic left occipital scalp infiltration, likely scarring. Small right frontal scalp lipoma, stable. Sinuses/Orbits: Paranasal sinuses and mastoid air cells are predominantly clear. Pneumatization of the petrous apices. Orbital structures are unremarkable aside from prior lens extractions. Other: Extensive debris in the external auditory canals. Mild bilateral TMJ arthrosis. Largely edentulous with mild mandibular prognathism. CT CERVICAL FINDINGS Alignment: Stabilization collar is absent. Mild straightening of the upper cervical lordosis with reversal at the C4-5 levels. Mild retrolisthesis C4 on 5 and C5 on 6 is likely degenerative in nature. No evidence of traumatic listhesis. No abnormally widened, perched or jumped facets. Normal alignment of the craniocervical and atlantoaxial articulations accounting for leftward cranial rotation. Skull base and vertebrae: No acute skull base fracture. No vertebral fracture or height loss. Arthrosis present at the atlantodental interval with some mild calcific pannus formation. Multilevel spondylitic changes are detailed below. Soft tissues and spinal canal: No pre or paravertebral fluid or swelling. No visible canal hematoma. Enthesopathic changes in the nuchal ligament. Disc levels: Multilevel intervertebral disc height loss with spondylitic endplate changes. Features maximal C4-5 and C5-6 with small posterior disc osteophyte complexes resulting in at most mild canal stenosis. Uncinate spurring facet hypertrophic changes are maximal at these levels as well with moderate to severe stenosis on the left at C5-6 and more moderate changes on the right at C4-5. Additional multilevel foraminal narrowing is mild. Other: Secretions noted in the posterior naso and  oropharynx above the level of the inflated endotracheal balloon. Extensive cervical carotid atherosclerosis. No concerning thyroid nodules.  CT CHEST FINDINGS Cardiovascular: Limited assessment of the vasculature in the absence of contrast media. Cardiac size within normal limits. Small pericardial effusion. Extensive three-vessel coronary artery disease. Extensive calcifications upon the aortic valve leaflets. Atherosclerotic plaque within the normal caliber aorta. Shared origin of brachiocephalic and left common carotid artery origins. Calcific plaque throughout the proximal great vessels. Central pulmonary arteries are borderline enlarged. Luminal evaluation precluded in the absence of contrast media. Mediastinum/Nodes: No mediastinal fluid or gas. Normal thyroid gland and thoracic inlet. No worrisome mediastinal or axillary adenopathy. Hilar nodal evaluation is limited in the absence of intravenous contrast media. Endotracheal tube terminates in the mid to lower trachea, 3 cm from the carina. Transesophageal tube tip terminates in the gastric body with side port distal to the GE junction. Lungs/Pleura: Heterogeneous dependent areas of mixed ground-glass and dense consolidative opacities are present in both lungs with multiple fluid-filled dependent airways as well as interlobular septal thickening and fissural thickening and small bilateral pleural effusions with further atelectatic changes and volume loss as well. No pneumothorax. Musculoskeletal: Posterior left third through sixth left rib fractures. Posterior right rib 4 rib fractures. No other acute osseous injuries in the chest wall. Asymmetric sternoclavicular arthrosis, increased on the right. Degenerative changes in both shoulders. Multilevel degenerative changes present spine Multilevel flowing anterior osteophytosis, compatible with features of diffuse idiopathic skeletal hyperostosis (DISH). Mild body wall edema. CT ABDOMEN AND PELVIS FINDINGS  Hepatobiliary: No visible hepatic injury or concerning liver lesion on this unenhanced CT. No perihepatic hematoma. Smooth surface contour. Normal attenuation. Patient is post cholecystectomy. Slight prominence of the biliary tree likely related to post cholecystectomy reservoir effect without visible calcified intraductal gallstones. Pancreas: Moderate pancreatic atrophy and partial fatty replacement of the pancreas. No ductal dilatation, discernible lesions, or inflammation. Spleen: No direct splenic injury. No perisplenic hematoma. Normal splenic size. No concerning lesions. Few scattered vascular calcifications. Adrenals/Urinary Tract: Mild lobular thickening of the adrenal gland could reflect some senescent hyperplasia without concerning nodules. No evidence of adrenal hemorrhage. No direct renal injury or perinephric hematoma. Bilateral renal atrophy with mild symmetric perinephric stranding, possibly related to senescent change or diminished renal function. Few vascular calcifications are present in both kidneys. Collecting system calculus seen in the upper pole right kidney. No obstructive urolithiasis is evident. Hyperdense 18 mm cystic structure arising from the posterior interpolar right kidney (3/72) additional subcentimeter hyperdense cystic structure arising from the upper pole left kidney (3/65). No other concerning renal lesions. Urinary bladder is largely decompressed by Foley catheter at the time of exam and therefore poorly evaluated by CT imaging. Further obscuration by streak artifact from a hip prosthesis. Stomach/Bowel: Transesophageal tube in appropriate position terminating in the gastric body with side port distal to the GE junction as detailed above. Mild tenting of the anterior stomach is a typical finding for placement of a transesophageal tube. Small duodenal lipoma. Small distal duodenal diverticulum (3/69). No small bowel thickening or dilatation. Mild fecalization of the distal small  bowel contents can reflect some slowed intestinal transit Appendix is not visualized. No focal inflammation the vicinity of the cecum to suggest an occult appendicitis. No colonic dilatation or wall thickening. Vascular/Lymphatic: Extensive atherosclerotic calcification throughout the abdominal aorta and branch vessels. Luminal evaluation precluded in the absence of contrast media. No aneurysm or ectasia. No periaortic stranding or hemorrhage. No suspicious or enlarged lymph nodes in the included lymphatic chains. Reproductive: In prostate traversed by the Foley catheter. Coarse eccentric calcification of the prostate. No concerning abnormalities of  the prostate or seminal vesicles. Nonspecific penile calcifications are noted. Other: No abdominopelvic free air or fluid. No bowel containing hernia. Small fat containing umbilical hernia. No traumatic abdominal wall dehiscence. No body wall or retroperitoneal hematoma. Musculoskeletal: The osseous structures appear diffusely demineralized which may limit detection of small or nondisplaced fractures. No acute osseous abnormality or suspicious osseous lesion. Extensive streak artifact related to patient's right hip arthroplasty. Multilevel degenerative changes are present in the imaged portions of the spine. Additional degenerative changes in the hips and pelvis. No acute osseous abnormality of the included upper extremities. IMPRESSION: CT HEAD: 1. No acute intracranial abnormality. 2. Stable parenchymal volume loss and chronic microvascular ischemic changes. 3. Debris in the external auditory canals. CT CERVICAL SPINE: 1. No acute cervical spine fracture or traumatic listhesis. 2. Multilevel degenerative changes of the cervical spine, as described above. CT CHEST, ABDOMEN AND PELVIS 1. Unenhanced CT was performed per clinician order. Lack of IV contrast limits sensitivity and specificity, especially for evaluation of the vasculature and abdominal/pelvic solid viscera.  2. Posterior left third through sixth rib fractures. Posterior right rib 4 rib fractures. Can be seen in the setting of resuscitative efforts. No pneumothorax. 3. Heterogeneous largely dependent areas of mixed ground-glass and dense consolidation in both lungs with fluid-filled dependent airways as well as septal and fissural thickening, and small bilateral pleural effusions as well as features of volume loss. Such findings are worrisome for sequela of aspiration likely with superimposed pulmonary edema and volume loss. Mild contusive changes may also be present in the setting of resuscitation. 4. Endotracheal tube in the mid to lower trachea, 3 cm from the carina. 5. Transesophageal tube in appropriate position terminating in the gastric body with side port distal to the GE junction. 6. No other acute abdominopelvic abnormalities. 7. Nonobstructing right nephrolithiasis. 8. Hyperdense cystic structures arising from the lateral right interpolar kidney and upper pole left kidney., possible hyperdense exophytic cyst. Recommend outpatient renal ultrasound. 9. Aortic Atherosclerosis (ICD10-I70.0). Electronically Signed   By: Lovena Le M.D.   On: 11/30/2019 02:57   DG Chest Portable 1 View  Result Date: 11/30/2019 CLINICAL DATA:  Shortness of breath EXAM: PORTABLE CHEST 1 VIEW COMPARISON:  09/16/2019 FINDINGS: Mild cardiomegaly. Diffuse interstitial opacity. No pleural effusion or pneumothorax. No focal airspace consolidation. IMPRESSION: Cardiomegaly and mild pulmonary edema. Electronically Signed   By: Ulyses Jarred M.D.   On: 11/30/2019 01:55     STUDIES:  7/9: CXR>Mild cardiomegaly. Diffuse interstitial opacity. No pleural effusion or pneumothorax. No focal airspace consolidation.>  7/9: CT Head wo Contrast>>1. No acute intracranial abnormality. 2. Stable parenchymal volume loss and chronic microvascular ischemic changes. 3. Debris in the external auditory canals. 7/9: CT Cervical Spine>>1. No acute  cervical spine fracture or traumatic listhesis. 2. Multilevel degenerative changes of the cervical spine, as described above. 7/9: CT Chest/Abdomen/Pelvis>>1. Unenhanced CT was performed per clinician order. Lack of IV contrast limits sensitivity and specificity, especially for evaluation of the vasculature and abdominal/pelvic solid viscera. 2. Posterior left third through sixth rib fractures. Posterior right rib 4 rib fractures. Can be seen in the setting of resuscitative efforts. No pneumothorax. 3. Heterogeneous largely dependent areas of mixed ground-glass and dense consolidation in both lungs with fluid-filled dependent airways as well as septal and fissural thickening, and small bilateral pleural effusions as well as features of volume loss. Such findings are worrisome for sequela of aspiration likely with superimposed pulmonary edema and volume loss. Mild contusive changes may also be present in  the setting of resuscitation. 4. Endotracheal tube in the mid to lower trachea, 3 cm from the carina. 5. Transesophageal tube in appropriate position terminating in the gastric body with side port distal to the GE junction. 6. No other acute abdominopelvic abnormalities. 7. Nonobstructing right nephrolithiasis. 8. Hyperdense cystic structures arising from the lateral right interpolar kidney and upper pole left kidney., possible hyperdense exophytic cyst. Recommend outpatient renal ultrasound. 9. Aortic Atherosclerosis  7/9: 2D Echocardiogram>>  CULTURES: SARS-CoV-2 PCR 7/9>>negative Blood 7/9>> Tracheal aspirate 7/9>>  ANTIBIOTICS: Unasyn 7/9>>  SIGNIFICANT EVENTS: 7/9: Presented to ED; required intubation due to aspiration 7/9: PCCM asked to admit to ICU  LINES/TUBES: ETT 7/9>>   ASSESSMENT / PLAN:   Hyperosmolar non-ketotic hyperglycemia - Endox2 protocol  - Diabetic coordinator - appreciate input  - judicious use of IVF due to advaced CHF with bilateral infiltrates  on 80%fIO2 at this time  - electrolyte repletion - pharmD eval    Aspiration pneumonia  - solid debris aspirated from tracheal via ETT suspicious for food bolus  -continue IV Unasyn   - wean FiO2 as able  - recruitment maneuvers  -repeat CXR in 2d  - PRN albuterol   Generalized seizures  - neurology on case - appreciate input - reivewed with Dr Doy Mince - likely metabolic in etiology  - treat underlying cause - correct electrolytes, remedy AKI, treat HHS  - EEG scheduled   -c/w Keppra IV   Atrial fibrillation with rapid ventricular response  - we need to decrease volume infusion due to CHF  -will transition to xarelto via OGT  - currently rate controlled at 71bpm  -TTE in progress  - cardiology consultation - appreciate input  OSA  - currently on MV   Dehydration   - due to HHS   - corrected Na 156 - will start free h2O via OGT  -continue IVF    Acute on crhonic renal failure - stage 2  -d/c non essential nephrotoxic medications  -judicious IVF    GI/DVT ppx   - famotidine   - heparin >>xarelto  Critical care provider statement:    Critical care time (minutes):  109   Critical care time was exclusive of:  Separately billable procedures and  treating other patients   Critical care was necessary to treat or prevent imminent or  life-threatening deterioration of the following conditions:  seizure activity, AKI/ckd, aspiration pneumonia, acute hypoxemia, chf, af, multiple comorbid conditions.    Critical care was time spent personally by me on the following  activities:  Development of treatment plan with patient or surrogate,  discussions with consultants, evaluation of patient's response to  treatment, examination of patient, obtaining history from patient or  surrogate, ordering and performing treatments and interventions, ordering  and review of laboratory studies and re-evaluation of patient's condition   I assumed direction of critical care for this patient  from another  provider in my specialty: no      Ottie Glazier, M.D.  [facility]

## 2019-11-30 NOTE — ED Notes (Signed)
Pt having few beat runs of vtach

## 2019-11-30 NOTE — H&P (Signed)
PULMONARY / CRITICAL CARE MEDICINE   Name: [name] MRN: [id number] DOB: 20-Mar-1935    ADMISSION DATE:  11/30/2019 CONSULTATION DATE:  11/30/2019  REFERRING MD:  Dr. Alfred Levins  CHIEF COMPLAINT:  Tonic-Clonic Seizure, Hyperglycemia  BRIEF DISCUSSION: 85 y.o. Male admitted 11/30/19 with Tonic-Clonic seizure, Acute Hypoxic Hypercapnic Respiratory Failure requiring intubation secondary to Aspiration Pneumonia & Pulmonary Edema, A-fib w/ RVR, intermittent nonsustained V-tach, questionable NSTEMI, and HHS.  Cardiology and Neurology are consulted.  HISTORY OF PRESENT ILLNESS:   [name] is a 85 year old male with a past medical history significant for atrial fibrillation on Xarelto, HFpEF, CKD, diabetes mellitus, bilateral BKA's, PVD, sleep apnea, and COVID-19 infection in September 2020 who presents to [facility] ED on 11/30/2019 due to syncope and hyperglycemia.  Patient is currently intubated and sedated with no family present, therefore history is obtained from ED and nursing notes.  Per notes patient had a syncopal episode this evening, and when EMS arrived they found him face down on the floor very confused (he was noted to have a pulse and spontaneous breathing).  CBG was too high to read.  Patient was combative of which he was given 2 mg of IV Versed.  While in route to the hospital with EMS he went into V. tach and received 150 mg of amiodarone.  Upon arrival to the ED he was very agitated, not answering questions, with increased work of breathing and gurgling respirations.  EKG with atrial fibrillation with diffuse deep ST depressions and elevations and the anterior leads, with intermittent runs of V. tach despite receiving 150 mg of amiodarone in route to the hospital with EMS.  ED provider consulted with Dr. Fletcher Anon of Cardiology who evaluated the EKG and determined the patient did not meet STEMI criteria.  Cardiology recommended starting patient on heparin and continuing amiodarone drip.  Patient was noted to hypoxic with bilateral crackles for which he was subsequently placed on BiPAP.  He was noted to have a  witnessed generalized tonic-clonic seizure which he was given 2 mg of IV Ativan and was intubated for airway protection (noted to have some vomit in his posterior oropharynx during intubation concerning for aspiration).    CT of the head was negative for any acute intracranial process, CT cervical spine is negative, and CT chest/abdomen/pelvis with posterior left rib fractures (no Pneumothorax), aspiration pneumonia, pulmonary edema, good positioning of the ETT, and non-obstructing right Neprholithiasis.  Patient again developed A. fib with RVR which he received IV metoprolol. Lab work shows glucose 679, BUN 28, Creatinine 1.72, anion gap 16, BNP 390, high sensitivity troponin 39, lactic acid 7.1, WBC 21.8 (with Neutrophilia).  His COVID-19 PCR is negative. Urinalysis negative for ketones and negative for UTI.  Given his severely elevated glucose he was placed on insulin drip.  PCCM is asked to admit the pt to ICU for further workup and treatment of Tonic-Clonic seizure, Acute Hypoxic Hypercapnic Respiratory Failure secondary to Aspiration Pneumonia & Pulmonary Edema, A-fib w/ RVR, intermittent nonsustained V-tach, and questionable NSTEMI.  Cardiology and Neurology are consulted.     PAST MEDICAL HISTORY :  He  has a past medical history of Arthritis, Atrial fibrillation ([place]), Carcinoma of prostate ([place]), CHF (congestive heart failure) ([place]), Chronic kidney disease, Diabetes mellitus without complication ([place]), ED (erectile dysfunction), Frequent urination, Hyperlipidemia, Hypertension, Moderate mitral insufficiency, Peripheral vascular disease ([place]), Pneumonia (04/2016), Prostate cancer [facility]), Sleep apnea, Ulcer of left foot due to type 2 diabetes mellitus ([place]), and Urinary stress incontinence, male.  PAST SURGICAL HISTORY: He  has a past surgical history that includes Prostate  surgery; Tonsillectomy; Eye surgery (Bilateral); Cholecystectomy; Lower Extremity Angiography (Left, 08/16/2016); Lower Extremity Angiography (Left, 09/01/2016); LOWER EXTREMITY INTERVENTION (09/01/2016); Irrigation and debridement foot (Left, 10/15/2016); Apligraft placement (Left, 10/15/2016); Lower Extremity Angiography (Left, 10/14/2016); Irrigation and debridement foot (Left, 12/09/2016); Lower Extremity Angiography (Left, 12/20/2016); Amputation (Left, 01/12/2017); LEFT HEART CATH AND CORONARY ANGIOGRAPHY (N/A, 06/05/2018); Hip Arthroplasty (Right, 10/13/2018); Lower Extremity Angiography (Right, 12/18/2018); Irrigation and debridement foot (Right, 12/23/2018); Esophagogastroduodenoscopy (egd) with propofol (N/A, 01/02/2019); Colonoscopy with propofol (N/A, 01/02/2019); Colonoscopy with propofol (N/A, 01/05/2019); Amputation (Right, 12/27/2018); and Left Heart Cath (N/A, 06/25/2019).  Allergies  Allergen Reactions   Contrast Media [Iodinated Diagnostic Agents] Shortness Of Breath    SOB   Iohexol Shortness Of Breath     Desc: Respiratory Distress, Laryngedema, diaphoresis, Onset Date: [id number]    Metrizamide Shortness Of Breath    SOB SOB SOB    Latex Rash    Current Facility-Administered Medications on File Prior to Encounter  Medication   diphenhydrAMINE (BENADRYL) injection 50 mg   famotidine (PEPCID) tablet 40 mg   Current Outpatient Medications on File Prior to Encounter  Medication Sig   acetaminophen (TYLENOL) 325 MG tablet Take 650 mg by mouth every 6 (six) hours as needed.   amiodarone (PACERONE) 200 MG tablet Take 200 mg by mouth daily.   aspirin EC 81 MG tablet Take 1 tablet (81 mg total) by mouth daily.   atorvastatin (LIPITOR) 80 MG tablet Take 80 mg by mouth every evening.   bisacodyl (DULCOLAX) 5 MG EC tablet Take 1 tablet (5 mg total) by mouth daily as needed for moderate constipation.   chlorthalidone (HYGROTON) 25 MG tablet Take 25 mg by mouth daily.   Cholecalciferol  (VITAMIN D-3) 25 MCG (1000 UT) CAPS Take by mouth.   collagenase (SANTYL) ointment Apply 1 application topically daily. (Patient not taking: Reported on 06/24/2019)   diltiazem (CARDIZEM CD) 120 MG 24 hr capsule Take 120 mg by mouth daily.   divalproex (DEPAKOTE ER) 500 MG 24 hr tablet Take 1 tablet (500 mg total) by mouth daily.   doxycycline (VIBRAMYCIN) 100 MG capsule Take 100 mg by mouth 2 (two) times daily.   Ensure Max Protein (ENSURE MAX PROTEIN) LIQD Take 330 mLs (11 oz total) by mouth 2 (two) times daily.   ferrous sulfate 325 (65 FE) MG tablet Take 1 tablet (325 mg total) by mouth daily with breakfast.   gabapentin (NEURONTIN) 300 MG capsule Take 300 mg by mouth 3 (three) times daily.    HYDROcodone-acetaminophen (NORCO/VICODIN) 5-325 MG tablet Take 1 tablet by mouth every 6 (six) hours as needed for moderate pain (pain score 4-6). (Patient not taking: Reported on 06/24/2019)   Insulin Glargine (BASAGLAR KWIKPEN) 100 UNIT/ML SMARTSIG:15 Unit(s) SUB-Q Every Night   insulin glargine (LANTUS) 100 UNIT/ML injection Inject 0.15 mLs (15 Units total) into the skin at bedtime.   insulin lispro (HUMALOG) 100 UNIT/ML injection Inject 0.03 mLs (3 Units total) into the skin 3 (three) times daily with meals. (Patient taking differently: Inject 3 Units into the skin 3 (three) times daily with meals. Plus 1 additional unit for every 50 BG points over 200 (max 5 additional units per dose))   isosorbide mononitrate (IMDUR) 60 MG 24 hr tablet Take 1 tablet (60 mg total) by mouth daily.   metFORMIN (GLUCOPHAGE) 500 MG tablet Take by mouth daily.   metoprolol succinate (TOPROL-XL) 25 MG 24 hr tablet Take 25  mg by mouth daily.   metoprolol tartrate (LOPRESSOR) 25 MG tablet Take 25 mg by mouth 3 (three) times daily.   Multiple Vitamin (MULTIVITAMIN WITH MINERALS) TABS tablet Take 1 tablet by mouth daily.   nitroGLYCERIN (NITROSTAT) 0.4 MG SL tablet Place 1 tablet (0.4 mg total) under the tongue  every 5 (five) minutes as needed for chest pain.   NOVOLOG FLEXPEN 100 UNIT/ML FlexPen    potassium chloride (KLOR-CON) 20 MEQ packet Take by mouth 2 (two) times daily.   Rivaroxaban (XARELTO) 15 MG TABS tablet Take 1 tablet (15 mg total) by mouth daily with supper. Hold Xarelto for 3 days.  Start on January 09, 2019.   senna (SENOKOT) 8.6 MG TABS tablet Take 1 tablet (8.6 mg total) by mouth daily as needed for mild constipation.   silver sulfADIAZINE (SILVADENE) 1 % cream Apply 1 application topically daily.   vitamin C (VITAMIN C) 250 MG tablet Take 1 tablet (250 mg total) by mouth 2 (two) times daily. (Patient not taking: Reported on 06/24/2019)   zinc sulfate 220 (50 Zn) MG capsule Take 220 mg by mouth daily.    FAMILY HISTORY:  His He indicated that his mother is deceased. He indicated that his father is deceased. He indicated that the status of his neg hx is unknown.   SOCIAL HISTORY: He  reports that he quit smoking about 25 years ago. His smoking use included cigarettes. He smoked 0.25 packs per day. He has never used smokeless tobacco. He reports that he does not drink alcohol and does not use drugs.    COVID-19 DISASTER DECLARATION:  FULL CONTACT PHYSICAL EXAMINATION WAS NOT POSSIBLE DUE TO TREATMENT OF COVID-19 AND  CONSERVATION OF PERSONAL PROTECTIVE EQUIPMENT, LIMITED EXAM FINDINGS INCLUDE-  Patient assessed or the symptoms described in the history of present illness.  In the context of the Global COVID-19 pandemic, which necessitated consideration that the patient might be at risk for infection with the SARS-CoV-2 virus that causes COVID-19, Institutional protocols and algorithms that pertain to the evaluation of patients at risk for COVID-19 are in a state of rapid change based on information released by regulatory bodies including the CDC and federal and state organizations. These policies and algorithms were followed during the patient's care while in  hospital.   REVIEW OF SYSTEMS:   Unable to assess due to intubation and sedation  SUBJECTIVE:  Unable to assess due to intubation and sedation  VITAL SIGNS: BP (!) 166/83    Pulse (!) 122    Temp (!) 96.5 F (35.8 C) (Axillary)    Resp 10    Wt 80.7 kg    SpO2 (!) 89%    BMI 27.86 kg/m   HEMODYNAMICS:    VENTILATOR SETTINGS: Vent Mode: AC FiO2 (%):  [100 %] 100 % Set Rate:  [18 bmp] 18 bmp Vt Set:  [425 mL] 425 mL PEEP:  [10 cmH20] 10 cmH20  INTAKE / OUTPUT: No intake/output data recorded.  PHYSICAL EXAMINATION: General: Acute on chronically ill-appearing male, laying in bed, intubated and sedated, no acute distress Neuro: Sedated, withdraws from pain, pupils PERRLA HEENT: Atraumatic, normocephalic, neck supple, no JVD Cardiovascular: Irregular irregular rhythm, no murmurs, rubs, gallops Lungs: Coarse breath sounds bilaterally, even, ventilator assisted Abdomen: Soft, nontender, nondistended, no guarding or rebound tenderness, bowel sounds positive x4 Musculoskeletal: Bilateral BKA's, no edema Skin: Warm and dry.  No obvious rashes, lesions, ulcerations  LABS:  BMET Recent Labs  Lab 11/30/19 0109  NA 137  K  4.0  CL 99  CO2 22  BUN 28*  CREATININE 1.72*  GLUCOSE 679*    Electrolytes Recent Labs  Lab 11/30/19 0109  CALCIUM 10.5*  MG 2.0    CBC Recent Labs  Lab 11/30/19 0109  WBC 21.8*  HGB 17.6*  HCT 52.3*  PLT 241    Coag's Recent Labs  Lab 11/30/19 0109  APTT 25  INR 1.1    Sepsis Markers No results for input(s): LATICACIDVEN, PROCALCITON, O2SATVEN in the last 168 hours.  ABG No results for input(s): PHART, PCO2ART, PO2ART in the last 168 hours.  Liver Enzymes Recent Labs  Lab 11/30/19 0109  AST 43*  ALT 28  ALKPHOS 172*  BILITOT 1.3*  ALBUMIN 3.5    Cardiac Enzymes No results for input(s): TROPONINI, PROBNP in the last 168 hours.  Glucose Recent Labs  Lab 11/30/19 0106 11/30/19 0158 11/30/19 0226  GLUCAP >600*  >600* >600*    Imaging CT ABDOMEN PELVIS WO CONTRAST  Result Date: 11/30/2019 CLINICAL DATA:  Found down with seizure-like activity, V-tach, seizure while in ED EXAM: CT HEAD WITHOUT CONTRAST CT CERVICAL SPINE WITHOUT CONTRAST CT CHEST, ABDOMEN AND PELVIS WITHOUT CONTRAST TECHNIQUE: Contiguous axial images were obtained from the base of the skull through the vertex without intravenous contrast. Multidetector CT imaging of the cervical spine was performed without intravenous contrast. Multiplanar CT image reconstructions were also generated. Multidetector CT imaging of the chest, abdomen and pelvis was performed following the standard protocol without IV contrast. COMPARISON:  CT head 09/16/2019, MR 06/05/2018, same day chest radiograph, CT abdomen pelvis 09/17/2006 FINDINGS: CT HEAD FINDINGS Brain: No evidence of acute infarction, hemorrhage, hydrocephalus, extra-axial collection or mass lesion/mass effect. Symmetric prominence of the ventricles, cisterns and sulci compatible with parenchymal volume loss. Patchy areas of white matter hypoattenuation are most compatible with chronic microvascular angiopathy. Vascular: Atherosclerotic calcification of the carotid siphons and intradural vertebral arteries. No hyperdense vessel. Skull: No acute scalp swelling or hematoma. No calvarial fracture or other acute or suspicious osseous lesion. Chronic left occipital scalp infiltration, likely scarring. Small right frontal scalp lipoma, stable. Sinuses/Orbits: Paranasal sinuses and mastoid air cells are predominantly clear. Pneumatization of the petrous apices. Orbital structures are unremarkable aside from prior lens extractions. Other: Extensive debris in the external auditory canals. Mild bilateral TMJ arthrosis. Largely edentulous with mild mandibular prognathism. CT CERVICAL FINDINGS Alignment: Stabilization collar is absent. Mild straightening of the upper cervical lordosis with reversal at the C4-5 levels. Mild  retrolisthesis C4 on 5 and C5 on 6 is likely degenerative in nature. No evidence of traumatic listhesis. No abnormally widened, perched or jumped facets. Normal alignment of the craniocervical and atlantoaxial articulations accounting for leftward cranial rotation. Skull base and vertebrae: No acute skull base fracture. No vertebral fracture or height loss. Arthrosis present at the atlantodental interval with some mild calcific pannus formation. Multilevel spondylitic changes are detailed below. Soft tissues and spinal canal: No pre or paravertebral fluid or swelling. No visible canal hematoma. Enthesopathic changes in the nuchal ligament. Disc levels: Multilevel intervertebral disc height loss with spondylitic endplate changes. Features maximal C4-5 and C5-6 with small posterior disc osteophyte complexes resulting in at most mild canal stenosis. Uncinate spurring facet hypertrophic changes are maximal at these levels as well with moderate to severe stenosis on the left at C5-6 and more moderate changes on the right at C4-5. Additional multilevel foraminal narrowing is mild. Other: Secretions noted in the posterior naso and oropharynx above the level of the inflated endotracheal  balloon. Extensive cervical carotid atherosclerosis. No concerning thyroid nodules. CT CHEST FINDINGS Cardiovascular: Limited assessment of the vasculature in the absence of contrast media. Cardiac size within normal limits. Small pericardial effusion. Extensive three-vessel coronary artery disease. Extensive calcifications upon the aortic valve leaflets. Atherosclerotic plaque within the normal caliber aorta. Shared origin of brachiocephalic and left common carotid artery origins. Calcific plaque throughout the proximal great vessels. Central pulmonary arteries are borderline enlarged. Luminal evaluation precluded in the absence of contrast media. Mediastinum/Nodes: No mediastinal fluid or gas. Normal thyroid gland and thoracic inlet. No  worrisome mediastinal or axillary adenopathy. Hilar nodal evaluation is limited in the absence of intravenous contrast media. Endotracheal tube terminates in the mid to lower trachea, 3 cm from the carina. Transesophageal tube tip terminates in the gastric body with side port distal to the GE junction. Lungs/Pleura: Heterogeneous dependent areas of mixed ground-glass and dense consolidative opacities are present in both lungs with multiple fluid-filled dependent airways as well as interlobular septal thickening and fissural thickening and small bilateral pleural effusions with further atelectatic changes and volume loss as well. No pneumothorax. Musculoskeletal: Posterior left third through sixth left rib fractures. Posterior right rib 4 rib fractures. No other acute osseous injuries in the chest wall. Asymmetric sternoclavicular arthrosis, increased on the right. Degenerative changes in both shoulders. Multilevel degenerative changes present spine Multilevel flowing anterior osteophytosis, compatible with features of diffuse idiopathic skeletal hyperostosis (DISH). Mild body wall edema. CT ABDOMEN AND PELVIS FINDINGS Hepatobiliary: No visible hepatic injury or concerning liver lesion on this unenhanced CT. No perihepatic hematoma. Smooth surface contour. Normal attenuation. Patient is post cholecystectomy. Slight prominence of the biliary tree likely related to post cholecystectomy reservoir effect without visible calcified intraductal gallstones. Pancreas: Moderate pancreatic atrophy and partial fatty replacement of the pancreas. No ductal dilatation, discernible lesions, or inflammation. Spleen: No direct splenic injury. No perisplenic hematoma. Normal splenic size. No concerning lesions. Few scattered vascular calcifications. Adrenals/Urinary Tract: Mild lobular thickening of the adrenal gland could reflect some senescent hyperplasia without concerning nodules. No evidence of adrenal hemorrhage. No direct renal  injury or perinephric hematoma. Bilateral renal atrophy with mild symmetric perinephric stranding, possibly related to senescent change or diminished renal function. Few vascular calcifications are present in both kidneys. Collecting system calculus seen in the upper pole right kidney. No obstructive urolithiasis is evident. Hyperdense 18 mm cystic structure arising from the posterior interpolar right kidney (3/72) additional subcentimeter hyperdense cystic structure arising from the upper pole left kidney (3/65). No other concerning renal lesions. Urinary bladder is largely decompressed by Foley catheter at the time of exam and therefore poorly evaluated by CT imaging. Further obscuration by streak artifact from a hip prosthesis. Stomach/Bowel: Transesophageal tube in appropriate position terminating in the gastric body with side port distal to the GE junction as detailed above. Mild tenting of the anterior stomach is a typical finding for placement of a transesophageal tube. Small duodenal lipoma. Small distal duodenal diverticulum (3/69). No small bowel thickening or dilatation. Mild fecalization of the distal small bowel contents can reflect some slowed intestinal transit Appendix is not visualized. No focal inflammation the vicinity of the cecum to suggest an occult appendicitis. No colonic dilatation or wall thickening. Vascular/Lymphatic: Extensive atherosclerotic calcification throughout the abdominal aorta and branch vessels. Luminal evaluation precluded in the absence of contrast media. No aneurysm or ectasia. No periaortic stranding or hemorrhage. No suspicious or enlarged lymph nodes in the included lymphatic chains. Reproductive: In prostate traversed by the Foley catheter. Coarse  eccentric calcification of the prostate. No concerning abnormalities of the prostate or seminal vesicles. Nonspecific penile calcifications are noted. Other: No abdominopelvic free air or fluid. No bowel containing hernia.  Small fat containing umbilical hernia. No traumatic abdominal wall dehiscence. No body wall or retroperitoneal hematoma. Musculoskeletal: The osseous structures appear diffusely demineralized which may limit detection of small or nondisplaced fractures. No acute osseous abnormality or suspicious osseous lesion. Extensive streak artifact related to patient's right hip arthroplasty. Multilevel degenerative changes are present in the imaged portions of the spine. Additional degenerative changes in the hips and pelvis. No acute osseous abnormality of the included upper extremities. IMPRESSION: CT HEAD: 1. No acute intracranial abnormality. 2. Stable parenchymal volume loss and chronic microvascular ischemic changes. 3. Debris in the external auditory canals. CT CERVICAL SPINE: 1. No acute cervical spine fracture or traumatic listhesis. 2. Multilevel degenerative changes of the cervical spine, as described above. CT CHEST, ABDOMEN AND PELVIS 1. Unenhanced CT was performed per clinician order. Lack of IV contrast limits sensitivity and specificity, especially for evaluation of the vasculature and abdominal/pelvic solid viscera. 2. Posterior left third through sixth rib fractures. Posterior right rib 4 rib fractures. Can be seen in the setting of resuscitative efforts. No pneumothorax. 3. Heterogeneous largely dependent areas of mixed ground-glass and dense consolidation in both lungs with fluid-filled dependent airways as well as septal and fissural thickening, and small bilateral pleural effusions as well as features of volume loss. Such findings are worrisome for sequela of aspiration likely with superimposed pulmonary edema and volume loss. Mild contusive changes may also be present in the setting of resuscitation. 4. Endotracheal tube in the mid to lower trachea, 3 cm from the carina. 5. Transesophageal tube in appropriate position terminating in the gastric body with side port distal to the GE junction. 6. No other  acute abdominopelvic abnormalities. 7. Nonobstructing right nephrolithiasis. 8. Hyperdense cystic structures arising from the lateral right interpolar kidney and upper pole left kidney., possible hyperdense exophytic cyst. Recommend outpatient renal ultrasound. 9. Aortic Atherosclerosis (ICD10-I70.0). Electronically Signed   By: Lovena Le M.D.   On: 11/30/2019 02:57   CT Head Wo Contrast  Result Date: 11/30/2019 CLINICAL DATA:  Found down with seizure-like activity, V-tach, seizure while in ED EXAM: CT HEAD WITHOUT CONTRAST CT CERVICAL SPINE WITHOUT CONTRAST CT CHEST, ABDOMEN AND PELVIS WITHOUT CONTRAST TECHNIQUE: Contiguous axial images were obtained from the base of the skull through the vertex without intravenous contrast. Multidetector CT imaging of the cervical spine was performed without intravenous contrast. Multiplanar CT image reconstructions were also generated. Multidetector CT imaging of the chest, abdomen and pelvis was performed following the standard protocol without IV contrast. COMPARISON:  CT head 09/16/2019, MR 06/05/2018, same day chest radiograph, CT abdomen pelvis 09/17/2006 FINDINGS: CT HEAD FINDINGS Brain: No evidence of acute infarction, hemorrhage, hydrocephalus, extra-axial collection or mass lesion/mass effect. Symmetric prominence of the ventricles, cisterns and sulci compatible with parenchymal volume loss. Patchy areas of white matter hypoattenuation are most compatible with chronic microvascular angiopathy. Vascular: Atherosclerotic calcification of the carotid siphons and intradural vertebral arteries. No hyperdense vessel. Skull: No acute scalp swelling or hematoma. No calvarial fracture or other acute or suspicious osseous lesion. Chronic left occipital scalp infiltration, likely scarring. Small right frontal scalp lipoma, stable. Sinuses/Orbits: Paranasal sinuses and mastoid air cells are predominantly clear. Pneumatization of the petrous apices. Orbital structures are  unremarkable aside from prior lens extractions. Other: Extensive debris in the external auditory canals. Mild bilateral TMJ arthrosis. Largely  edentulous with mild mandibular prognathism. CT CERVICAL FINDINGS Alignment: Stabilization collar is absent. Mild straightening of the upper cervical lordosis with reversal at the C4-5 levels. Mild retrolisthesis C4 on 5 and C5 on 6 is likely degenerative in nature. No evidence of traumatic listhesis. No abnormally widened, perched or jumped facets. Normal alignment of the craniocervical and atlantoaxial articulations accounting for leftward cranial rotation. Skull base and vertebrae: No acute skull base fracture. No vertebral fracture or height loss. Arthrosis present at the atlantodental interval with some mild calcific pannus formation. Multilevel spondylitic changes are detailed below. Soft tissues and spinal canal: No pre or paravertebral fluid or swelling. No visible canal hematoma. Enthesopathic changes in the nuchal ligament. Disc levels: Multilevel intervertebral disc height loss with spondylitic endplate changes. Features maximal C4-5 and C5-6 with small posterior disc osteophyte complexes resulting in at most mild canal stenosis. Uncinate spurring facet hypertrophic changes are maximal at these levels as well with moderate to severe stenosis on the left at C5-6 and more moderate changes on the right at C4-5. Additional multilevel foraminal narrowing is mild. Other: Secretions noted in the posterior naso and oropharynx above the level of the inflated endotracheal balloon. Extensive cervical carotid atherosclerosis. No concerning thyroid nodules. CT CHEST FINDINGS Cardiovascular: Limited assessment of the vasculature in the absence of contrast media. Cardiac size within normal limits. Small pericardial effusion. Extensive three-vessel coronary artery disease. Extensive calcifications upon the aortic valve leaflets. Atherosclerotic plaque within the normal caliber  aorta. Shared origin of brachiocephalic and left common carotid artery origins. Calcific plaque throughout the proximal great vessels. Central pulmonary arteries are borderline enlarged. Luminal evaluation precluded in the absence of contrast media. Mediastinum/Nodes: No mediastinal fluid or gas. Normal thyroid gland and thoracic inlet. No worrisome mediastinal or axillary adenopathy. Hilar nodal evaluation is limited in the absence of intravenous contrast media. Endotracheal tube terminates in the mid to lower trachea, 3 cm from the carina. Transesophageal tube tip terminates in the gastric body with side port distal to the GE junction. Lungs/Pleura: Heterogeneous dependent areas of mixed ground-glass and dense consolidative opacities are present in both lungs with multiple fluid-filled dependent airways as well as interlobular septal thickening and fissural thickening and small bilateral pleural effusions with further atelectatic changes and volume loss as well. No pneumothorax. Musculoskeletal: Posterior left third through sixth left rib fractures. Posterior right rib 4 rib fractures. No other acute osseous injuries in the chest wall. Asymmetric sternoclavicular arthrosis, increased on the right. Degenerative changes in both shoulders. Multilevel degenerative changes present spine Multilevel flowing anterior osteophytosis, compatible with features of diffuse idiopathic skeletal hyperostosis (DISH). Mild body wall edema. CT ABDOMEN AND PELVIS FINDINGS Hepatobiliary: No visible hepatic injury or concerning liver lesion on this unenhanced CT. No perihepatic hematoma. Smooth surface contour. Normal attenuation. Patient is post cholecystectomy. Slight prominence of the biliary tree likely related to post cholecystectomy reservoir effect without visible calcified intraductal gallstones. Pancreas: Moderate pancreatic atrophy and partial fatty replacement of the pancreas. No ductal dilatation, discernible lesions, or  inflammation. Spleen: No direct splenic injury. No perisplenic hematoma. Normal splenic size. No concerning lesions. Few scattered vascular calcifications. Adrenals/Urinary Tract: Mild lobular thickening of the adrenal gland could reflect some senescent hyperplasia without concerning nodules. No evidence of adrenal hemorrhage. No direct renal injury or perinephric hematoma. Bilateral renal atrophy with mild symmetric perinephric stranding, possibly related to senescent change or diminished renal function. Few vascular calcifications are present in both kidneys. Collecting system calculus seen in the upper pole right kidney. No  obstructive urolithiasis is evident. Hyperdense 18 mm cystic structure arising from the posterior interpolar right kidney (3/72) additional subcentimeter hyperdense cystic structure arising from the upper pole left kidney (3/65). No other concerning renal lesions. Urinary bladder is largely decompressed by Foley catheter at the time of exam and therefore poorly evaluated by CT imaging. Further obscuration by streak artifact from a hip prosthesis. Stomach/Bowel: Transesophageal tube in appropriate position terminating in the gastric body with side port distal to the GE junction as detailed above. Mild tenting of the anterior stomach is a typical finding for placement of a transesophageal tube. Small duodenal lipoma. Small distal duodenal diverticulum (3/69). No small bowel thickening or dilatation. Mild fecalization of the distal small bowel contents can reflect some slowed intestinal transit Appendix is not visualized. No focal inflammation the vicinity of the cecum to suggest an occult appendicitis. No colonic dilatation or wall thickening. Vascular/Lymphatic: Extensive atherosclerotic calcification throughout the abdominal aorta and branch vessels. Luminal evaluation precluded in the absence of contrast media. No aneurysm or ectasia. No periaortic stranding or hemorrhage. No suspicious or  enlarged lymph nodes in the included lymphatic chains. Reproductive: In prostate traversed by the Foley catheter. Coarse eccentric calcification of the prostate. No concerning abnormalities of the prostate or seminal vesicles. Nonspecific penile calcifications are noted. Other: No abdominopelvic free air or fluid. No bowel containing hernia. Small fat containing umbilical hernia. No traumatic abdominal wall dehiscence. No body wall or retroperitoneal hematoma. Musculoskeletal: The osseous structures appear diffusely demineralized which may limit detection of small or nondisplaced fractures. No acute osseous abnormality or suspicious osseous lesion. Extensive streak artifact related to patient's right hip arthroplasty. Multilevel degenerative changes are present in the imaged portions of the spine. Additional degenerative changes in the hips and pelvis. No acute osseous abnormality of the included upper extremities. IMPRESSION: CT HEAD: 1. No acute intracranial abnormality. 2. Stable parenchymal volume loss and chronic microvascular ischemic changes. 3. Debris in the external auditory canals. CT CERVICAL SPINE: 1. No acute cervical spine fracture or traumatic listhesis. 2. Multilevel degenerative changes of the cervical spine, as described above. CT CHEST, ABDOMEN AND PELVIS 1. Unenhanced CT was performed per clinician order. Lack of IV contrast limits sensitivity and specificity, especially for evaluation of the vasculature and abdominal/pelvic solid viscera. 2. Posterior left third through sixth rib fractures. Posterior right rib 4 rib fractures. Can be seen in the setting of resuscitative efforts. No pneumothorax. 3. Heterogeneous largely dependent areas of mixed ground-glass and dense consolidation in both lungs with fluid-filled dependent airways as well as septal and fissural thickening, and small bilateral pleural effusions as well as features of volume loss. Such findings are worrisome for sequela of  aspiration likely with superimposed pulmonary edema and volume loss. Mild contusive changes may also be present in the setting of resuscitation. 4. Endotracheal tube in the mid to lower trachea, 3 cm from the carina. 5. Transesophageal tube in appropriate position terminating in the gastric body with side port distal to the GE junction. 6. No other acute abdominopelvic abnormalities. 7. Nonobstructing right nephrolithiasis. 8. Hyperdense cystic structures arising from the lateral right interpolar kidney and upper pole left kidney., possible hyperdense exophytic cyst. Recommend outpatient renal ultrasound. 9. Aortic Atherosclerosis (ICD10-I70.0). Electronically Signed   By: Lovena Le M.D.   On: 11/30/2019 02:57   CT Chest Wo Contrast  Result Date: 11/30/2019 CLINICAL DATA:  Found down with seizure-like activity, V-tach, seizure while in ED EXAM: CT HEAD WITHOUT CONTRAST CT CERVICAL SPINE WITHOUT CONTRAST CT  CHEST, ABDOMEN AND PELVIS WITHOUT CONTRAST TECHNIQUE: Contiguous axial images were obtained from the base of the skull through the vertex without intravenous contrast. Multidetector CT imaging of the cervical spine was performed without intravenous contrast. Multiplanar CT image reconstructions were also generated. Multidetector CT imaging of the chest, abdomen and pelvis was performed following the standard protocol without IV contrast. COMPARISON:  CT head 09/16/2019, MR 06/05/2018, same day chest radiograph, CT abdomen pelvis 09/17/2006 FINDINGS: CT HEAD FINDINGS Brain: No evidence of acute infarction, hemorrhage, hydrocephalus, extra-axial collection or mass lesion/mass effect. Symmetric prominence of the ventricles, cisterns and sulci compatible with parenchymal volume loss. Patchy areas of white matter hypoattenuation are most compatible with chronic microvascular angiopathy. Vascular: Atherosclerotic calcification of the carotid siphons and intradural vertebral arteries. No hyperdense vessel. Skull:  No acute scalp swelling or hematoma. No calvarial fracture or other acute or suspicious osseous lesion. Chronic left occipital scalp infiltration, likely scarring. Small right frontal scalp lipoma, stable. Sinuses/Orbits: Paranasal sinuses and mastoid air cells are predominantly clear. Pneumatization of the petrous apices. Orbital structures are unremarkable aside from prior lens extractions. Other: Extensive debris in the external auditory canals. Mild bilateral TMJ arthrosis. Largely edentulous with mild mandibular prognathism. CT CERVICAL FINDINGS Alignment: Stabilization collar is absent. Mild straightening of the upper cervical lordosis with reversal at the C4-5 levels. Mild retrolisthesis C4 on 5 and C5 on 6 is likely degenerative in nature. No evidence of traumatic listhesis. No abnormally widened, perched or jumped facets. Normal alignment of the craniocervical and atlantoaxial articulations accounting for leftward cranial rotation. Skull base and vertebrae: No acute skull base fracture. No vertebral fracture or height loss. Arthrosis present at the atlantodental interval with some mild calcific pannus formation. Multilevel spondylitic changes are detailed below. Soft tissues and spinal canal: No pre or paravertebral fluid or swelling. No visible canal hematoma. Enthesopathic changes in the nuchal ligament. Disc levels: Multilevel intervertebral disc height loss with spondylitic endplate changes. Features maximal C4-5 and C5-6 with small posterior disc osteophyte complexes resulting in at most mild canal stenosis. Uncinate spurring facet hypertrophic changes are maximal at these levels as well with moderate to severe stenosis on the left at C5-6 and more moderate changes on the right at C4-5. Additional multilevel foraminal narrowing is mild. Other: Secretions noted in the posterior naso and oropharynx above the level of the inflated endotracheal balloon. Extensive cervical carotid atherosclerosis. No  concerning thyroid nodules. CT CHEST FINDINGS Cardiovascular: Limited assessment of the vasculature in the absence of contrast media. Cardiac size within normal limits. Small pericardial effusion. Extensive three-vessel coronary artery disease. Extensive calcifications upon the aortic valve leaflets. Atherosclerotic plaque within the normal caliber aorta. Shared origin of brachiocephalic and left common carotid artery origins. Calcific plaque throughout the proximal great vessels. Central pulmonary arteries are borderline enlarged. Luminal evaluation precluded in the absence of contrast media. Mediastinum/Nodes: No mediastinal fluid or gas. Normal thyroid gland and thoracic inlet. No worrisome mediastinal or axillary adenopathy. Hilar nodal evaluation is limited in the absence of intravenous contrast media. Endotracheal tube terminates in the mid to lower trachea, 3 cm from the carina. Transesophageal tube tip terminates in the gastric body with side port distal to the GE junction. Lungs/Pleura: Heterogeneous dependent areas of mixed ground-glass and dense consolidative opacities are present in both lungs with multiple fluid-filled dependent airways as well as interlobular septal thickening and fissural thickening and small bilateral pleural effusions with further atelectatic changes and volume loss as well. No pneumothorax. Musculoskeletal: Posterior left third through sixth  left rib fractures. Posterior right rib 4 rib fractures. No other acute osseous injuries in the chest wall. Asymmetric sternoclavicular arthrosis, increased on the right. Degenerative changes in both shoulders. Multilevel degenerative changes present spine Multilevel flowing anterior osteophytosis, compatible with features of diffuse idiopathic skeletal hyperostosis (DISH). Mild body wall edema. CT ABDOMEN AND PELVIS FINDINGS Hepatobiliary: No visible hepatic injury or concerning liver lesion on this unenhanced CT. No perihepatic hematoma.  Smooth surface contour. Normal attenuation. Patient is post cholecystectomy. Slight prominence of the biliary tree likely related to post cholecystectomy reservoir effect without visible calcified intraductal gallstones. Pancreas: Moderate pancreatic atrophy and partial fatty replacement of the pancreas. No ductal dilatation, discernible lesions, or inflammation. Spleen: No direct splenic injury. No perisplenic hematoma. Normal splenic size. No concerning lesions. Few scattered vascular calcifications. Adrenals/Urinary Tract: Mild lobular thickening of the adrenal gland could reflect some senescent hyperplasia without concerning nodules. No evidence of adrenal hemorrhage. No direct renal injury or perinephric hematoma. Bilateral renal atrophy with mild symmetric perinephric stranding, possibly related to senescent change or diminished renal function. Few vascular calcifications are present in both kidneys. Collecting system calculus seen in the upper pole right kidney. No obstructive urolithiasis is evident. Hyperdense 18 mm cystic structure arising from the posterior interpolar right kidney (3/72) additional subcentimeter hyperdense cystic structure arising from the upper pole left kidney (3/65). No other concerning renal lesions. Urinary bladder is largely decompressed by Foley catheter at the time of exam and therefore poorly evaluated by CT imaging. Further obscuration by streak artifact from a hip prosthesis. Stomach/Bowel: Transesophageal tube in appropriate position terminating in the gastric body with side port distal to the GE junction as detailed above. Mild tenting of the anterior stomach is a typical finding for placement of a transesophageal tube. Small duodenal lipoma. Small distal duodenal diverticulum (3/69). No small bowel thickening or dilatation. Mild fecalization of the distal small bowel contents can reflect some slowed intestinal transit Appendix is not visualized. No focal inflammation the  vicinity of the cecum to suggest an occult appendicitis. No colonic dilatation or wall thickening. Vascular/Lymphatic: Extensive atherosclerotic calcification throughout the abdominal aorta and branch vessels. Luminal evaluation precluded in the absence of contrast media. No aneurysm or ectasia. No periaortic stranding or hemorrhage. No suspicious or enlarged lymph nodes in the included lymphatic chains. Reproductive: In prostate traversed by the Foley catheter. Coarse eccentric calcification of the prostate. No concerning abnormalities of the prostate or seminal vesicles. Nonspecific penile calcifications are noted. Other: No abdominopelvic free air or fluid. No bowel containing hernia. Small fat containing umbilical hernia. No traumatic abdominal wall dehiscence. No body wall or retroperitoneal hematoma. Musculoskeletal: The osseous structures appear diffusely demineralized which may limit detection of small or nondisplaced fractures. No acute osseous abnormality or suspicious osseous lesion. Extensive streak artifact related to patient's right hip arthroplasty. Multilevel degenerative changes are present in the imaged portions of the spine. Additional degenerative changes in the hips and pelvis. No acute osseous abnormality of the included upper extremities. IMPRESSION: CT HEAD: 1. No acute intracranial abnormality. 2. Stable parenchymal volume loss and chronic microvascular ischemic changes. 3. Debris in the external auditory canals. CT CERVICAL SPINE: 1. No acute cervical spine fracture or traumatic listhesis. 2. Multilevel degenerative changes of the cervical spine, as described above. CT CHEST, ABDOMEN AND PELVIS 1. Unenhanced CT was performed per clinician order. Lack of IV contrast limits sensitivity and specificity, especially for evaluation of the vasculature and abdominal/pelvic solid viscera. 2. Posterior left third through sixth rib fractures.  Posterior right rib 4 rib fractures. Can be seen in the  setting of resuscitative efforts. No pneumothorax. 3. Heterogeneous largely dependent areas of mixed ground-glass and dense consolidation in both lungs with fluid-filled dependent airways as well as septal and fissural thickening, and small bilateral pleural effusions as well as features of volume loss. Such findings are worrisome for sequela of aspiration likely with superimposed pulmonary edema and volume loss. Mild contusive changes may also be present in the setting of resuscitation. 4. Endotracheal tube in the mid to lower trachea, 3 cm from the carina. 5. Transesophageal tube in appropriate position terminating in the gastric body with side port distal to the GE junction. 6. No other acute abdominopelvic abnormalities. 7. Nonobstructing right nephrolithiasis. 8. Hyperdense cystic structures arising from the lateral right interpolar kidney and upper pole left kidney., possible hyperdense exophytic cyst. Recommend outpatient renal ultrasound. 9. Aortic Atherosclerosis (ICD10-I70.0). Electronically Signed   By: Lovena Le M.D.   On: 11/30/2019 02:57   CT Cervical Spine Wo Contrast  Result Date: 11/30/2019 CLINICAL DATA:  Found down with seizure-like activity, V-tach, seizure while in ED EXAM: CT HEAD WITHOUT CONTRAST CT CERVICAL SPINE WITHOUT CONTRAST CT CHEST, ABDOMEN AND PELVIS WITHOUT CONTRAST TECHNIQUE: Contiguous axial images were obtained from the base of the skull through the vertex without intravenous contrast. Multidetector CT imaging of the cervical spine was performed without intravenous contrast. Multiplanar CT image reconstructions were also generated. Multidetector CT imaging of the chest, abdomen and pelvis was performed following the standard protocol without IV contrast. COMPARISON:  CT head 09/16/2019, MR 06/05/2018, same day chest radiograph, CT abdomen pelvis 09/17/2006 FINDINGS: CT HEAD FINDINGS Brain: No evidence of acute infarction, hemorrhage, hydrocephalus, extra-axial collection or  mass lesion/mass effect. Symmetric prominence of the ventricles, cisterns and sulci compatible with parenchymal volume loss. Patchy areas of white matter hypoattenuation are most compatible with chronic microvascular angiopathy. Vascular: Atherosclerotic calcification of the carotid siphons and intradural vertebral arteries. No hyperdense vessel. Skull: No acute scalp swelling or hematoma. No calvarial fracture or other acute or suspicious osseous lesion. Chronic left occipital scalp infiltration, likely scarring. Small right frontal scalp lipoma, stable. Sinuses/Orbits: Paranasal sinuses and mastoid air cells are predominantly clear. Pneumatization of the petrous apices. Orbital structures are unremarkable aside from prior lens extractions. Other: Extensive debris in the external auditory canals. Mild bilateral TMJ arthrosis. Largely edentulous with mild mandibular prognathism. CT CERVICAL FINDINGS Alignment: Stabilization collar is absent. Mild straightening of the upper cervical lordosis with reversal at the C4-5 levels. Mild retrolisthesis C4 on 5 and C5 on 6 is likely degenerative in nature. No evidence of traumatic listhesis. No abnormally widened, perched or jumped facets. Normal alignment of the craniocervical and atlantoaxial articulations accounting for leftward cranial rotation. Skull base and vertebrae: No acute skull base fracture. No vertebral fracture or height loss. Arthrosis present at the atlantodental interval with some mild calcific pannus formation. Multilevel spondylitic changes are detailed below. Soft tissues and spinal canal: No pre or paravertebral fluid or swelling. No visible canal hematoma. Enthesopathic changes in the nuchal ligament. Disc levels: Multilevel intervertebral disc height loss with spondylitic endplate changes. Features maximal C4-5 and C5-6 with small posterior disc osteophyte complexes resulting in at most mild canal stenosis. Uncinate spurring facet hypertrophic changes  are maximal at these levels as well with moderate to severe stenosis on the left at C5-6 and more moderate changes on the right at C4-5. Additional multilevel foraminal narrowing is mild. Other: Secretions noted in the posterior naso and  oropharynx above the level of the inflated endotracheal balloon. Extensive cervical carotid atherosclerosis. No concerning thyroid nodules. CT CHEST FINDINGS Cardiovascular: Limited assessment of the vasculature in the absence of contrast media. Cardiac size within normal limits. Small pericardial effusion. Extensive three-vessel coronary artery disease. Extensive calcifications upon the aortic valve leaflets. Atherosclerotic plaque within the normal caliber aorta. Shared origin of brachiocephalic and left common carotid artery origins. Calcific plaque throughout the proximal great vessels. Central pulmonary arteries are borderline enlarged. Luminal evaluation precluded in the absence of contrast media. Mediastinum/Nodes: No mediastinal fluid or gas. Normal thyroid gland and thoracic inlet. No worrisome mediastinal or axillary adenopathy. Hilar nodal evaluation is limited in the absence of intravenous contrast media. Endotracheal tube terminates in the mid to lower trachea, 3 cm from the carina. Transesophageal tube tip terminates in the gastric body with side port distal to the GE junction. Lungs/Pleura: Heterogeneous dependent areas of mixed ground-glass and dense consolidative opacities are present in both lungs with multiple fluid-filled dependent airways as well as interlobular septal thickening and fissural thickening and small bilateral pleural effusions with further atelectatic changes and volume loss as well. No pneumothorax. Musculoskeletal: Posterior left third through sixth left rib fractures. Posterior right rib 4 rib fractures. No other acute osseous injuries in the chest wall. Asymmetric sternoclavicular arthrosis, increased on the right. Degenerative changes in both  shoulders. Multilevel degenerative changes present spine Multilevel flowing anterior osteophytosis, compatible with features of diffuse idiopathic skeletal hyperostosis (DISH). Mild body wall edema. CT ABDOMEN AND PELVIS FINDINGS Hepatobiliary: No visible hepatic injury or concerning liver lesion on this unenhanced CT. No perihepatic hematoma. Smooth surface contour. Normal attenuation. Patient is post cholecystectomy. Slight prominence of the biliary tree likely related to post cholecystectomy reservoir effect without visible calcified intraductal gallstones. Pancreas: Moderate pancreatic atrophy and partial fatty replacement of the pancreas. No ductal dilatation, discernible lesions, or inflammation. Spleen: No direct splenic injury. No perisplenic hematoma. Normal splenic size. No concerning lesions. Few scattered vascular calcifications. Adrenals/Urinary Tract: Mild lobular thickening of the adrenal gland could reflect some senescent hyperplasia without concerning nodules. No evidence of adrenal hemorrhage. No direct renal injury or perinephric hematoma. Bilateral renal atrophy with mild symmetric perinephric stranding, possibly related to senescent change or diminished renal function. Few vascular calcifications are present in both kidneys. Collecting system calculus seen in the upper pole right kidney. No obstructive urolithiasis is evident. Hyperdense 18 mm cystic structure arising from the posterior interpolar right kidney (3/72) additional subcentimeter hyperdense cystic structure arising from the upper pole left kidney (3/65). No other concerning renal lesions. Urinary bladder is largely decompressed by Foley catheter at the time of exam and therefore poorly evaluated by CT imaging. Further obscuration by streak artifact from a hip prosthesis. Stomach/Bowel: Transesophageal tube in appropriate position terminating in the gastric body with side port distal to the GE junction as detailed above. Mild tenting  of the anterior stomach is a typical finding for placement of a transesophageal tube. Small duodenal lipoma. Small distal duodenal diverticulum (3/69). No small bowel thickening or dilatation. Mild fecalization of the distal small bowel contents can reflect some slowed intestinal transit Appendix is not visualized. No focal inflammation the vicinity of the cecum to suggest an occult appendicitis. No colonic dilatation or wall thickening. Vascular/Lymphatic: Extensive atherosclerotic calcification throughout the abdominal aorta and branch vessels. Luminal evaluation precluded in the absence of contrast media. No aneurysm or ectasia. No periaortic stranding or hemorrhage. No suspicious or enlarged lymph nodes in the included lymphatic chains. Reproductive:  In prostate traversed by the Foley catheter. Coarse eccentric calcification of the prostate. No concerning abnormalities of the prostate or seminal vesicles. Nonspecific penile calcifications are noted. Other: No abdominopelvic free air or fluid. No bowel containing hernia. Small fat containing umbilical hernia. No traumatic abdominal wall dehiscence. No body wall or retroperitoneal hematoma. Musculoskeletal: The osseous structures appear diffusely demineralized which may limit detection of small or nondisplaced fractures. No acute osseous abnormality or suspicious osseous lesion. Extensive streak artifact related to patient's right hip arthroplasty. Multilevel degenerative changes are present in the imaged portions of the spine. Additional degenerative changes in the hips and pelvis. No acute osseous abnormality of the included upper extremities. IMPRESSION: CT HEAD: 1. No acute intracranial abnormality. 2. Stable parenchymal volume loss and chronic microvascular ischemic changes. 3. Debris in the external auditory canals. CT CERVICAL SPINE: 1. No acute cervical spine fracture or traumatic listhesis. 2. Multilevel degenerative changes of the cervical spine, as  described above. CT CHEST, ABDOMEN AND PELVIS 1. Unenhanced CT was performed per clinician order. Lack of IV contrast limits sensitivity and specificity, especially for evaluation of the vasculature and abdominal/pelvic solid viscera. 2. Posterior left third through sixth rib fractures. Posterior right rib 4 rib fractures. Can be seen in the setting of resuscitative efforts. No pneumothorax. 3. Heterogeneous largely dependent areas of mixed ground-glass and dense consolidation in both lungs with fluid-filled dependent airways as well as septal and fissural thickening, and small bilateral pleural effusions as well as features of volume loss. Such findings are worrisome for sequela of aspiration likely with superimposed pulmonary edema and volume loss. Mild contusive changes may also be present in the setting of resuscitation. 4. Endotracheal tube in the mid to lower trachea, 3 cm from the carina. 5. Transesophageal tube in appropriate position terminating in the gastric body with side port distal to the GE junction. 6. No other acute abdominopelvic abnormalities. 7. Nonobstructing right nephrolithiasis. 8. Hyperdense cystic structures arising from the lateral right interpolar kidney and upper pole left kidney., possible hyperdense exophytic cyst. Recommend outpatient renal ultrasound. 9. Aortic Atherosclerosis (ICD10-I70.0). Electronically Signed   By: Lovena Le M.D.   On: 11/30/2019 02:57   DG Chest Portable 1 View  Result Date: 11/30/2019 CLINICAL DATA:  Shortness of breath EXAM: PORTABLE CHEST 1 VIEW COMPARISON:  09/16/2019 FINDINGS: Mild cardiomegaly. Diffuse interstitial opacity. No pleural effusion or pneumothorax. No focal airspace consolidation. IMPRESSION: Cardiomegaly and mild pulmonary edema. Electronically Signed   By: Ulyses Jarred M.D.   On: 11/30/2019 01:55     STUDIES:  7/9: CXR>Mild cardiomegaly. Diffuse interstitial opacity. No pleural effusion or pneumothorax. No focal airspace  consolidation.>  7/9: CT Head wo Contrast>>1. No acute intracranial abnormality. 2. Stable parenchymal volume loss and chronic microvascular ischemic changes. 3. Debris in the external auditory canals. 7/9: CT Cervical Spine>>1. No acute cervical spine fracture or traumatic listhesis. 2. Multilevel degenerative changes of the cervical spine, as described above. 7/9: CT Chest/Abdomen/Pelvis>>1. Unenhanced CT was performed per clinician order. Lack of IV contrast limits sensitivity and specificity, especially for evaluation of the vasculature and abdominal/pelvic solid viscera. 2. Posterior left third through sixth rib fractures. Posterior right rib 4 rib fractures. Can be seen in the setting of resuscitative efforts. No pneumothorax. 3. Heterogeneous largely dependent areas of mixed ground-glass and dense consolidation in both lungs with fluid-filled dependent airways as well as septal and fissural thickening, and small bilateral pleural effusions as well as features of volume loss. Such findings are worrisome for sequela of  aspiration likely with superimposed pulmonary edema and volume loss. Mild contusive changes may also be present in the setting of resuscitation. 4. Endotracheal tube in the mid to lower trachea, 3 cm from the carina. 5. Transesophageal tube in appropriate position terminating in the gastric body with side port distal to the GE junction. 6. No other acute abdominopelvic abnormalities. 7. Nonobstructing right nephrolithiasis. 8. Hyperdense cystic structures arising from the lateral right interpolar kidney and upper pole left kidney., possible hyperdense exophytic cyst. Recommend outpatient renal ultrasound. 9. Aortic Atherosclerosis  7/9: 2D Echocardiogram>>  CULTURES: SARS-CoV-2 PCR 7/9>>negative Blood 7/9>> Tracheal aspirate 7/9>>  ANTIBIOTICS: Unasyn 7/9>>  SIGNIFICANT EVENTS: 7/9: Presented to ED; required intubation due to aspiration 7/9: PCCM asked to  admit to ICU  LINES/TUBES: ETT 7/9>>   ASSESSMENT / PLAN:  PULMONARY A: Acute Hypoxic Hypercapnic Respiratory Failure secondary to Aspiration Pneumonia & Pulmonary Edema Hx: OSA P:   Full Vent support; Lung protective strategies w/ TV 6 cc/kg IBW Wean FiO2 & PEEP as tolerated to maintain O2 sats >92% Follow intermittent CXR & ABG as needed VAP protocol Spontaneous breathing trials when respiratory parameters met IV Unasyn Diuresis as renal function and BP permits Prn Bronchodilators  CARDIOVASCULAR A:  V-tach A-fib w/ RVR Elevated troponin in setting of NSTEMI vs. Demand ischemia Acute Decompensated HFpEF Hx: HTN, A-fib on Xarelto, HLD P:  Continuous cardiac monitoring Maintain MAP >65 Cautious IVF given CHF Vasopressors if needed to maintain MAP goal Amiodarone gtt Heparin gtt Trend Troponin until downtrending Cardiology consulted, appreciate input Obtain 2D Echocardiogram Lasix as BP and renal function permits  RENAL A:   AKI on CKD Lactic acidosis P:   Monitor I&O's / urinary output Follow BMP Ensure adequate renal perfusion Avoid nephrotoxic agents as able Replace electrolytes as indicated Trend lactic acid  GASTROINTESTINAL A:   Mildly Elevated LFT's P:   NPO OG tube to LIS IV Pepcid for Stress ulcer prophylaxis Trend LFT's CT Abdomen 7/9 with no Acute Abdominal abnormalities  HEMATOLOGIC A:   No acute issues P:  Monitor for S/Sx of bleeding Trend CBC Heparin gtt for Anticoagulation/VTE Prophylaxis  Transfuse for Hgb <7   INFECTIOUS A:   Aspiration Pneumonia P:   Monitor fever curve Trend WBC's & Procalcitonin Follow cultures as above Place on Unasyn  ENDOCRINE A:   Diabetes Mellitus with marked Hyperglycemia, suspect HHS   P:   IV fluids Insulin gtt Trend BMP q4h Follow ICU Hypo/hyperglycemia protocol UA without ketones, serum Beta-Hydroxybutric acid pending; no metabolic acidosis on BMP or VBG Check serum  Osmolality Check Hemoglobin A1c Consult Diabetes Coordinator, appreciate input  NEUROLOGIC A:   Tonic-Clonic Seizure  Syncope P:   RASS goal: -1 to -2 Propofol gtt to maintain RASS goal Daily wake up assessment Prn Versed for breakthrough seizures Place on IV Keppra CT Head on 7/9 is negative Check Urine Drug Screen EEG Neurology consulted, appreciate input   FAMILY  - Updates: Updated pt's son Willia Lampert [facility]) at bedside 11/30/19  - Inter-disciplinary family meet or Palliative Care meeting due by:  12/07/2019    Darel Hong, AGACNP-BC [facility] Pager: [phone]  11/30/2019, 3:21 AM

## 2019-11-30 NOTE — Progress Notes (Signed)
[place] Progress Note Patient Name: Ryun Velez DOB: 20-Apr-1935 MRN: [id number]   Date of Service  11/30/2019  HPI/Events of Note  Patient brought in to [facility] ED by EMS with altered mental status, HHS, witnessed seizures, acute respiratory failure requiring intubation, lactic acidosis. Work up is ongoing. He is on an insulin infusion for HHS.  eICU Interventions  New Patient Evaluation completed.        Kerry Kass Sailor Hevia 11/30/2019, 5:44 AM

## 2019-11-30 NOTE — Code Documentation (Signed)
heparin stopped at this time

## 2019-11-30 NOTE — ED Notes (Signed)
Bicarb and calcium in at this time

## 2019-11-30 NOTE — Progress Notes (Signed)
*  PRELIMINARY RESULTS* Echocardiogram 2D Echocardiogram has been performed.  Sherrie Sport 11/30/2019, 4:09 PM

## 2019-11-30 NOTE — Consult Note (Signed)
[facility] Cardiology Consultation Note  Patient ID: [name], MRN: [id number], DOB/AGE: 83/17/1936 83 y.o. Admit date: 11/30/2019   Date of Consult: 11/30/2019 Primary Physician: Corey Skains, MD Primary Cardiologist: Nehemiah Massed  Chief Complaint:  Chief Complaint  Patient presents with  . Hyperglycemia   Reason for Consult: Atrial fibrillation congestive heart failure  HPI: 85 y.o. male with known severe peripheral vascular disease status post bilateral BKA and continued vascular issues as well as sleep apnea and chronic hypoxia who has had paroxysmal nonvalvular atrial fibrillation.  The patient has been in and out of the hospital for multiple reasons including diabetes weakness fatigue peripheral vascular disease congestive heart failure and atrial fibrillation.  The patient apparently had an episode of syncope with apparent seizure.  This could have been from multiple causes including hypoxia respiratory failure.  Under those conditions the patient is intubated and now is respirated with better oxygenation.  The patient has not had any other seizure activity since.  The patient does have known paroxysmal nonvalvular atrial fibrillation for which she had atrial fibrillation rapid ventricular rate 123 bpm with nonspecific ST changes.  He was placed on amiodarone drip for which has now converted back to normal sinus rhythm.  He remains on anticoagulation including Xarelto for further risk reduction of stroke as well as peripheral vascular disease complications which is appropriate.  Patient cannot tolerate any medication management for blood pressure control and or other concerns due to low blood pressure at this time.  There is no a troponin of 39 with consistent with demand ischemia and a BNP of 390 consistent with hypoxia and current condition rather than acute coronary syndrome or myocardial infarction  Past Medical History:  Diagnosis Date  . Arthritis   . Atrial  fibrillation ([place])   . Carcinoma of prostate ([place])   . CHF (congestive heart failure) ([place])   . Chronic kidney disease   . Diabetes mellitus without complication ([place])   . ED (erectile dysfunction)   . Frequent urination   . Hyperlipidemia   . Hypertension   . Moderate mitral insufficiency   . Peripheral vascular disease ([place])   . Pneumonia 04/2016  . Prostate cancer ([place])   . Sleep apnea    OSA--USE C-PAP  . Ulcer of left foot due to type 2 diabetes mellitus ([place])   . Urinary stress incontinence, male       Surgical History:  Past Surgical History:  Procedure Laterality Date  . AMPUTATION Left 01/12/2017   Procedure: AMPUTATION BELOW KNEE;  Surgeon: Algernon Huxley, MD;  Location: [facility] ORS;  Service: General;  Laterality: Left;  . AMPUTATION Right 12/27/2018   Procedure: AMPUTATION BELOW KNEE;  Surgeon: Algernon Huxley, MD;  Location: [facility] ORS;  Service: General;  Laterality: Right;  . APLIGRAFT PLACEMENT Left 10/15/2016   Procedure: APLIGRAFT PLACEMENT;  Surgeon: Albertine Patricia, DPM;  Location: [facility] ORS;  Service: Podiatry;  Laterality: Left;  necrotic ulcer  . CHOLECYSTECTOMY    . COLONOSCOPY WITH PROPOFOL N/A 01/02/2019   Procedure: COLONOSCOPY WITH PROPOFOL;  Surgeon: Lucilla Lame, MD;  Location: [facility] ENDOSCOPY;  Service: Endoscopy;  Laterality: N/A;  . COLONOSCOPY WITH PROPOFOL N/A 01/05/2019   Procedure: COLONOSCOPY WITH PROPOFOL;  Surgeon: Lucilla Lame, MD;  Location: [facility] ENDOSCOPY;  Service: Endoscopy;  Laterality: N/A;  . ESOPHAGOGASTRODUODENOSCOPY (EGD) WITH PROPOFOL N/A 01/02/2019   Procedure: ESOPHAGOGASTRODUODENOSCOPY (EGD) WITH PROPOFOL;  Surgeon: Lucilla Lame, MD;  Location: [facility] ENDOSCOPY;  Service: Endoscopy;  Laterality: N/A;  .  EYE SURGERY Bilateral    Cataract Extraction with IOL  . HIP ARTHROPLASTY Right 10/13/2018   Procedure: ARTHROPLASTY BIPOLAR HIP (HEMIARTHROPLASTY);  Surgeon: Earnestine Leys, MD;  Location: [facility] ORS;  Service: Orthopedics;  Laterality: Right;  .  IRRIGATION AND DEBRIDEMENT FOOT Left 10/15/2016   Procedure: IRRIGATION AND DEBRIDEMENT FOOT-EXCISIONAL DEBRIDEMENT OF SKIN 3RD, 4TH AND 5TH TOES WITH APPLICATION OF APLIGRAFT;  Surgeon: Albertine Patricia, DPM;  Location: [facility] ORS;  Service: Podiatry;  Laterality: Left;  necrotic,gangrene  . IRRIGATION AND DEBRIDEMENT FOOT Left 12/09/2016   Procedure: IRRIGATION AND DEBRIDEMENT FOOT-MUSCLE/FASCIA, RESECTION OF FOURTH AND FIFTH METATARSAL NECROTIC BONE;  Surgeon: Albertine Patricia, DPM;  Location: [facility] ORS;  Service: Podiatry;  Laterality: Left;  . IRRIGATION AND DEBRIDEMENT FOOT Right 12/23/2018   Procedure: IRRIGATION AND DEBRIDEMENT FOOT and wound vac application;  Surgeon: Samara Deist, DPM;  Location: [facility] ORS;  Service: Podiatry;  Laterality: Right;  . LEFT HEART CATH N/A 06/25/2019   Procedure: Left Heart Cath and Coronary Angiography;  Surgeon: Isaias Cowman, MD;  Location: [place] CV LAB;  Service: Cardiovascular;  Laterality: N/A;  . LEFT HEART CATH AND CORONARY ANGIOGRAPHY N/A 06/05/2018   Procedure: LEFT HEART CATH AND CORONARY ANGIOGRAPHY;  Surgeon: Yolonda Kida, MD;  Location: [place] [facility] LAB;  Service: Cardiovascular;  Laterality: N/A;  . LOWER EXTREMITY ANGIOGRAPHY Left 08/16/2016   Procedure: Lower Extremity Angiography;  Surgeon: Algernon Huxley, MD;  Location: [place] [facility];  Service: Cardiovascular;  Laterality: Left;  . LOWER EXTREMITY ANGIOGRAPHY Left 09/01/2016   Procedure: Lower Extremity Angiography;  Surgeon: Algernon Huxley, MD;  Location: [place] CV LAB;  Service: Cardiovascular;  Laterality: Left;  . LOWER EXTREMITY ANGIOGRAPHY Left 10/14/2016   Procedure: Lower Extremity Angiography;  Surgeon: Algernon Huxley, MD;  Location: [place] CV LAB;  Service: Cardiovascular;  Laterality: Left;  . LOWER EXTREMITY ANGIOGRAPHY Left 12/20/2016   Procedure: Lower Extremity Angiography;  Surgeon: Algernon Huxley, MD;  Location: [place] [facility] LAB;  Service:  Cardiovascular;  Laterality: Left;  . LOWER EXTREMITY ANGIOGRAPHY Right 12/18/2018   Procedure: LOWER EXTREMITY ANGIOGRAPHY;  Surgeon: Algernon Huxley, MD;  Location: [place] [facility] LAB;  Service: Cardiovascular;  Laterality: Right;  . LOWER EXTREMITY INTERVENTION  09/01/2016   Procedure: Lower Extremity Intervention;  Surgeon: Algernon Huxley, MD;  Location: [place] [facility];  Service: Cardiovascular;;  . PROSTATE SURGERY     removal  . TONSILLECTOMY     as a child     Home Meds: Prior to Admission medications   Medication Sig Start Date End Date Taking? Authorizing Provider  acetaminophen (TYLENOL) 325 MG tablet Take 650 mg by mouth every 6 (six) hours as needed.    [provider]  amiodarone (PACERONE) 200 MG tablet Take 200 mg by mouth daily. 09/13/18   [provider]  aspirin EC 81 MG tablet Take 1 tablet (81 mg total) by mouth daily. 12/18/18   Algernon Huxley, MD  atorvastatin (LIPITOR) 80 MG tablet Take 80 mg by mouth every evening. 07/31/18   [provider]  bisacodyl (DULCOLAX) 5 MG EC tablet Take 1 tablet (5 mg total) by mouth daily as needed for moderate constipation. 01/05/19   [name], MD  chlorthalidone (HYGROTON) 25 MG tablet Take 25 mg by mouth daily.    [provider]  Cholecalciferol (VITAMIN D-3) 25 MCG (1000 UT) CAPS Take by mouth.    [provider]  collagenase (SANTYL) ointment Apply 1  application topically daily. Patient not taking: Reported on 06/24/2019 04/16/19   Kris Hartmann, NP  diltiazem (CARDIZEM CD) 120 MG 24 hr capsule Take 120 mg by mouth daily. 09/28/18   [provider]  divalproex (DEPAKOTE ER) 500 MG 24 hr tablet Take 1 tablet (500 mg total) by mouth daily. 02/16/19   Charlynne Cousins, MD  doxycycline (VIBRAMYCIN) 100 MG capsule Take 100 mg by mouth 2 (two) times daily.    [provider]  Ensure Max Protein (ENSURE MAX PROTEIN) LIQD Take 330 mLs (11 oz total) by mouth 2 (two) times daily.  01/05/19   [name], MD  ferrous sulfate 325 (65 FE) MG tablet Take 1 tablet (325 mg total) by mouth daily with breakfast. 10/18/18   [name] Mandes, MD  gabapentin (NEURONTIN) 300 MG capsule Take 300 mg by mouth 3 (three) times daily.  02/07/19   [provider]  HYDROcodone-acetaminophen (NORCO/VICODIN) 5-325 MG tablet Take 1 tablet by mouth every 6 (six) hours as needed for moderate pain (pain score 4-6). Patient not taking: Reported on 06/24/2019 02/19/19   Charlynne Cousins, MD  Insulin Glargine [facility]) 100 UNIT/ML SMARTSIG:15 Unit(s) SUB-Q Every Night 09/12/19   [provider]  insulin glargine (LANTUS) 100 UNIT/ML injection Inject 0.15 mLs (15 Units total) into the skin at bedtime. 10/18/18   [name] Mandes, MD  insulin lispro (HUMALOG) 100 UNIT/ML injection Inject 0.03 mLs (3 Units total) into the skin 3 (three) times daily with meals. Patient taking differently: Inject 3 Units into the skin 3 (three) times daily with meals. Plus 1 additional unit for every 50 BG points over 200 (max 5 additional units per dose) 11/28/17   Loletha Grayer, MD  isosorbide mononitrate (IMDUR) 60 MG 24 hr tablet Take 1 tablet (60 mg total) by mouth daily. 04/06/19   Arta Silence, MD  metFORMIN (GLUCOPHAGE) 500 MG tablet Take by mouth daily.    [provider]  metoprolol succinate (TOPROL-XL) 25 MG 24 hr tablet Take 25 mg by mouth daily.    [provider]  metoprolol tartrate (LOPRESSOR) 25 MG tablet Take 25 mg by mouth 3 (three) times daily. 04/08/19   [provider]  Multiple Vitamin (MULTIVITAMIN WITH MINERALS) TABS tablet Take 1 tablet by mouth daily. 01/05/19   [name], MD  nitroGLYCERIN (NITROSTAT) 0.4 MG SL tablet Place 1 tablet (0.4 mg total) under the tongue every 5 (five) minutes as needed for chest pain. 06/09/18   Hillary Bow, MD  NOVOLOG FLEXPEN 100 UNIT/ML FlexPen  09/12/19   [provider]  potassium chloride (KLOR-CON) 20 MEQ  packet Take by mouth 2 (two) times daily.    [provider]  Rivaroxaban (XARELTO) 15 MG TABS tablet Take 1 tablet (15 mg total) by mouth daily with supper. Hold Xarelto for 3 days.  Start on January 09, 2019. 01/09/19   [name], MD  senna (SENOKOT) 8.6 MG TABS tablet Take 1 tablet (8.6 mg total) by mouth daily as needed for mild constipation. 01/05/19   [name], MD  silver sulfADIAZINE (SILVADENE) 1 % cream Apply 1 application topically daily. 01/30/19   Kris Hartmann, NP  vitamin C (VITAMIN C) 250 MG tablet Take 1 tablet (250 mg total) by mouth 2 (two) times daily. Patient not taking: Reported on 06/24/2019 01/05/19   [name], MD  zinc sulfate 220 (50 Zn) MG capsule Take 220 mg by mouth daily.    [provider]    Inpatient  Medications:  . chlorhexidine gluconate (MEDLINE KIT)  15 mL Mouth Rinse BID  . Chlorhexidine Gluconate Cloth  6 each Topical Daily  . docusate  100 mg Oral BID  . free water  200 mL Per Tube Q4H  . insulin aspart  0-15 Units Subcutaneous Q4H  . insulin glargine  12 Units Subcutaneous QHS  . mouth rinse  15 mL Mouth Rinse 10 times per day  . polyethylene glycol  17 g Oral Daily  . rivaroxaban  15 mg Oral Q supper   . sodium chloride 75 mL/hr at 11/30/19 1119  . albumin human Stopped (11/30/19 1148)  . ampicillin-sulbactam (UNASYN) IV 3 g (11/30/19 1711)  . dextrose 5 % and 0.45% NaCl 75 mL/hr at 11/30/19 1531  . famotidine (PEPCID) IV 20 mg (11/30/19 0509)  . levETIRAcetam Stopped (11/30/19 0755)  . propofol (DIPRIVAN) infusion 30 mcg/kg/min (11/30/19 1531)  . vasopressin      Allergies:  Allergies  Allergen Reactions  . Contrast Media [Iodinated Diagnostic Agents] Shortness Of Breath    SOB  . Iohexol Shortness Of Breath     Desc: Respiratory Distress, Laryngedema, diaphoresis, Onset Date: [id number]   . Metrizamide Shortness Of Breath    SOB SOB SOB   . Latex Rash    Social History   Socioeconomic History  . Marital  status: Married    Spouse name: Not on file  . Number of children: Not on file  . Years of education: Not on file  . Highest education level: Not on file  Occupational History  . Occupation: retired  Tobacco Use  . Smoking status: Former Smoker    Packs/day: 0.25    Types: Cigarettes    Quit date: 10/14/1994    Years since quitting: 25.1  . Smokeless tobacco: Never Used  Vaping Use  . Vaping Use: Never used  Substance and Sexual Activity  . Alcohol use: No    Alcohol/week: 0.0 standard drinks  . Drug use: No  . Sexual activity: Not on file  Other Topics Concern  . Not on file  Social History Narrative   Lives at home with wife, has a wheelchair   Social Determinants of Health   Financial Resource Strain:   . Difficulty of Paying Living Expenses:   Food Insecurity:   . Worried About Charity fundraiser in the Last Year:   . Arboriculturist in the Last Year:   Transportation Needs:   . Film/video editor (Medical):   Marland Kitchen Lack of Transportation (Non-Medical):   Physical Activity:   . Days of Exercise per Week:   . Minutes of Exercise per Session:   Stress:   . Feeling of Stress :   Social Connections:   . Frequency of Communication with Friends and Family:   . Frequency of Social Gatherings with Friends and Family:   . Attends Religious Services:   . Active Member of Clubs or Organizations:   . Attends Archivist Meetings:   Marland Kitchen Marital Status:   Intimate Partner Violence:   . Fear of Current or Ex-Partner:   . Emotionally Abused:   Marland Kitchen Physically Abused:   . Sexually Abused:      Family History  Problem Relation Age of Onset  . Hypertension Mother   . Diabetes Mother   . Prostate cancer Neg Hx   . Bladder Cancer Neg Hx   . Kidney disease Neg Hx      Review of Systems Cannot assess Labs:  No results for input(s): CKTOTAL, CKMB, TROPONINI in the last 72 hours. Lab Results  Component Value Date   WBC 21.8 (H) 11/30/2019   HGB 17.6 (H) 11/30/2019    HCT 52.3 (H) 11/30/2019   MCV 86.9 11/30/2019   PLT 241 11/30/2019    Recent Labs  Lab 11/30/19 0109 11/30/19 0533 11/30/19 1306  NA 919   < > DUPLICATE REQUEST  166  K 4.0   < > DUPLICATE REQUEST  4.4  CL 99   < > DUPLICATE REQUEST  060  CO2 22   < > DUPLICATE REQUEST  25  BUN 28*   < > DUPLICATE REQUEST  32*  CREATININE 0.45*   < > DUPLICATE REQUEST  9.97*  CALCIUM 74.1*   < > DUPLICATE REQUEST  42.3  PROT 7.9  --   --   BILITOT 1.3*  --   --   ALKPHOS 172*  --   --   ALT 28  --   --   AST 43*  --   --   GLUCOSE 953*   < > DUPLICATE REQUEST  202*   < > = values in this interval not displayed.   Lab Results  Component Value Date   CHOL 253 (H) 11/30/2019   HDL 34 (L) 06/25/2019   LDLCALC 76 06/25/2019   TRIG 174 (H) 11/30/2019   Lab Results  Component Value Date   DDIMER 0.98 (H) 02/10/2019    Radiology/Studies:  EEG  Result Date: 11/30/2019 Alexis Goodell, MD     11/30/2019  4:20 PM ELECTROENCEPHALOGRAM REPORT Patient: Dannell Gortney       Room #: IC06A-AA EEG No. ID: [id number] Age: 85 y.o.        Sex: male Requesting Physician: Lanney Gins Report Date:  11/30/2019       Interpreting Physician: Alexis Goodell History: Lars Jeziorski is an 85 y.o. male s/p arrest and seizure Medications: Keppra, Unasyn, Insulin, Propofol, Vasopressin Conditions of Recording:  This is a 21 channel routine scalp EEG performed with bipolar and monopolar montages arranged in accordance to the international 10/20 system of electrode placement. One channel was dedicated to EKG recording. The patient is in the intubated and sedated state. Description:  The background activity is discontinuous. It consists of low voltage bursts of polyspike, spike and slow wave activity alternating with periods of attenuation. The burst activity lasts up to 4 seconds.  The periods of attenuation last up to 6 seconds. This discontinuous activity is maintained throughout the tracing. No activation  procedures were performed.  Hyperventilation and intermittent photic stimulation were not performed. IMPRESSION: This is an abnormal EEG due to a burst-suppression pattern seen throughout the tracing.  Burst suppression pattern can be seen in a variety of circumstances, including anesthesia, drug intoxication, hypothermia, as well as cerebral anoxia.  Clinical/neurological, and radiographic correlation advised.  Alexis Goodell, MD Neurology [phone] 11/30/2019, 4:16 PM   CT ABDOMEN PELVIS WO CONTRAST  Result Date: 11/30/2019 CLINICAL DATA:  Found down with seizure-like activity, V-tach, seizure while in ED EXAM: CT HEAD WITHOUT CONTRAST CT CERVICAL SPINE WITHOUT CONTRAST CT CHEST, ABDOMEN AND PELVIS WITHOUT CONTRAST TECHNIQUE: Contiguous axial images were obtained from the base of the skull through the vertex without intravenous contrast. Multidetector CT imaging of the cervical spine was performed without intravenous contrast. Multiplanar CT image reconstructions were also generated. Multidetector CT imaging of the chest, abdomen and pelvis was performed following the standard protocol without IV contrast. COMPARISON:  CT head  09/16/2019, MR 06/05/2018, same day chest radiograph, CT abdomen pelvis 09/17/2006 FINDINGS: CT HEAD FINDINGS Brain: No evidence of acute infarction, hemorrhage, hydrocephalus, extra-axial collection or mass lesion/mass effect. Symmetric prominence of the ventricles, cisterns and sulci compatible with parenchymal volume loss. Patchy areas of white matter hypoattenuation are most compatible with chronic microvascular angiopathy. Vascular: Atherosclerotic calcification of the carotid siphons and intradural vertebral arteries. No hyperdense vessel. Skull: No acute scalp swelling or hematoma. No calvarial fracture or other acute or suspicious osseous lesion. Chronic left occipital scalp infiltration, likely scarring. Small right frontal scalp lipoma, stable. Sinuses/Orbits: Paranasal  sinuses and mastoid air cells are predominantly clear. Pneumatization of the petrous apices. Orbital structures are unremarkable aside from prior lens extractions. Other: Extensive debris in the external auditory canals. Mild bilateral TMJ arthrosis. Largely edentulous with mild mandibular prognathism. CT CERVICAL FINDINGS Alignment: Stabilization collar is absent. Mild straightening of the upper cervical lordosis with reversal at the C4-5 levels. Mild retrolisthesis C4 on 5 and C5 on 6 is likely degenerative in nature. No evidence of traumatic listhesis. No abnormally widened, perched or jumped facets. Normal alignment of the craniocervical and atlantoaxial articulations accounting for leftward cranial rotation. Skull base and vertebrae: No acute skull base fracture. No vertebral fracture or height loss. Arthrosis present at the atlantodental interval with some mild calcific pannus formation. Multilevel spondylitic changes are detailed below. Soft tissues and spinal canal: No pre or paravertebral fluid or swelling. No visible canal hematoma. Enthesopathic changes in the nuchal ligament. Disc levels: Multilevel intervertebral disc height loss with spondylitic endplate changes. Features maximal C4-5 and C5-6 with small posterior disc osteophyte complexes resulting in at most mild canal stenosis. Uncinate spurring facet hypertrophic changes are maximal at these levels as well with moderate to severe stenosis on the left at C5-6 and more moderate changes on the right at C4-5. Additional multilevel foraminal narrowing is mild. Other: Secretions noted in the posterior naso and oropharynx above the level of the inflated endotracheal balloon. Extensive cervical carotid atherosclerosis. No concerning thyroid nodules. CT CHEST FINDINGS Cardiovascular: Limited assessment of the vasculature in the absence of contrast media. Cardiac size within normal limits. Small pericardial effusion. Extensive three-vessel coronary artery  disease. Extensive calcifications upon the aortic valve leaflets. Atherosclerotic plaque within the normal caliber aorta. Shared origin of brachiocephalic and left common carotid artery origins. Calcific plaque throughout the proximal great vessels. Central pulmonary arteries are borderline enlarged. Luminal evaluation precluded in the absence of contrast media. Mediastinum/Nodes: No mediastinal fluid or gas. Normal thyroid gland and thoracic inlet. No worrisome mediastinal or axillary adenopathy. Hilar nodal evaluation is limited in the absence of intravenous contrast media. Endotracheal tube terminates in the mid to lower trachea, 3 cm from the carina. Transesophageal tube tip terminates in the gastric body with side port distal to the GE junction. Lungs/Pleura: Heterogeneous dependent areas of mixed ground-glass and dense consolidative opacities are present in both lungs with multiple fluid-filled dependent airways as well as interlobular septal thickening and fissural thickening and small bilateral pleural effusions with further atelectatic changes and volume loss as well. No pneumothorax. Musculoskeletal: Posterior left third through sixth left rib fractures. Posterior right rib 4 rib fractures. No other acute osseous injuries in the chest wall. Asymmetric sternoclavicular arthrosis, increased on the right. Degenerative changes in both shoulders. Multilevel degenerative changes present spine Multilevel flowing anterior osteophytosis, compatible with features of diffuse idiopathic skeletal hyperostosis (DISH). Mild body wall edema. CT ABDOMEN AND PELVIS FINDINGS Hepatobiliary: No visible hepatic injury or concerning liver  lesion on this unenhanced CT. No perihepatic hematoma. Smooth surface contour. Normal attenuation. Patient is post cholecystectomy. Slight prominence of the biliary tree likely related to post cholecystectomy reservoir effect without visible calcified intraductal gallstones. Pancreas: Moderate  pancreatic atrophy and partial fatty replacement of the pancreas. No ductal dilatation, discernible lesions, or inflammation. Spleen: No direct splenic injury. No perisplenic hematoma. Normal splenic size. No concerning lesions. Few scattered vascular calcifications. Adrenals/Urinary Tract: Mild lobular thickening of the adrenal gland could reflect some senescent hyperplasia without concerning nodules. No evidence of adrenal hemorrhage. No direct renal injury or perinephric hematoma. Bilateral renal atrophy with mild symmetric perinephric stranding, possibly related to senescent change or diminished renal function. Few vascular calcifications are present in both kidneys. Collecting system calculus seen in the upper pole right kidney. No obstructive urolithiasis is evident. Hyperdense 18 mm cystic structure arising from the posterior interpolar right kidney (3/72) additional subcentimeter hyperdense cystic structure arising from the upper pole left kidney (3/65). No other concerning renal lesions. Urinary bladder is largely decompressed by Foley catheter at the time of exam and therefore poorly evaluated by CT imaging. Further obscuration by streak artifact from a hip prosthesis. Stomach/Bowel: Transesophageal tube in appropriate position terminating in the gastric body with side port distal to the GE junction as detailed above. Mild tenting of the anterior stomach is a typical finding for placement of a transesophageal tube. Small duodenal lipoma. Small distal duodenal diverticulum (3/69). No small bowel thickening or dilatation. Mild fecalization of the distal small bowel contents can reflect some slowed intestinal transit Appendix is not visualized. No focal inflammation the vicinity of the cecum to suggest an occult appendicitis. No colonic dilatation or wall thickening. Vascular/Lymphatic: Extensive atherosclerotic calcification throughout the abdominal aorta and branch vessels. Luminal evaluation precluded in  the absence of contrast media. No aneurysm or ectasia. No periaortic stranding or hemorrhage. No suspicious or enlarged lymph nodes in the included lymphatic chains. Reproductive: In prostate traversed by the Foley catheter. Coarse eccentric calcification of the prostate. No concerning abnormalities of the prostate or seminal vesicles. Nonspecific penile calcifications are noted. Other: No abdominopelvic free air or fluid. No bowel containing hernia. Small fat containing umbilical hernia. No traumatic abdominal wall dehiscence. No body wall or retroperitoneal hematoma. Musculoskeletal: The osseous structures appear diffusely demineralized which may limit detection of small or nondisplaced fractures. No acute osseous abnormality or suspicious osseous lesion. Extensive streak artifact related to patient's right hip arthroplasty. Multilevel degenerative changes are present in the imaged portions of the spine. Additional degenerative changes in the hips and pelvis. No acute osseous abnormality of the included upper extremities. IMPRESSION: CT HEAD: 1. No acute intracranial abnormality. 2. Stable parenchymal volume loss and chronic microvascular ischemic changes. 3. Debris in the external auditory canals. CT CERVICAL SPINE: 1. No acute cervical spine fracture or traumatic listhesis. 2. Multilevel degenerative changes of the cervical spine, as described above. CT CHEST, ABDOMEN AND PELVIS 1. Unenhanced CT was performed per clinician order. Lack of IV contrast limits sensitivity and specificity, especially for evaluation of the vasculature and abdominal/pelvic solid viscera. 2. Posterior left third through sixth rib fractures. Posterior right rib 4 rib fractures. Can be seen in the setting of resuscitative efforts. No pneumothorax. 3. Heterogeneous largely dependent areas of mixed ground-glass and dense consolidation in both lungs with fluid-filled dependent airways as well as septal and fissural thickening, and small  bilateral pleural effusions as well as features of volume loss. Such findings are worrisome for sequela of aspiration likely with  superimposed pulmonary edema and volume loss. Mild contusive changes may also be present in the setting of resuscitation. 4. Endotracheal tube in the mid to lower trachea, 3 cm from the carina. 5. Transesophageal tube in appropriate position terminating in the gastric body with side port distal to the GE junction. 6. No other acute abdominopelvic abnormalities. 7. Nonobstructing right nephrolithiasis. 8. Hyperdense cystic structures arising from the lateral right interpolar kidney and upper pole left kidney., possible hyperdense exophytic cyst. Recommend outpatient renal ultrasound. 9. Aortic Atherosclerosis (ICD10-I70.0). Electronically Signed   By: Lovena Le M.D.   On: 11/30/2019 02:57   CT Head Wo Contrast  Result Date: 11/30/2019 CLINICAL DATA:  Found down with seizure-like activity, V-tach, seizure while in ED EXAM: CT HEAD WITHOUT CONTRAST CT CERVICAL SPINE WITHOUT CONTRAST CT CHEST, ABDOMEN AND PELVIS WITHOUT CONTRAST TECHNIQUE: Contiguous axial images were obtained from the base of the skull through the vertex without intravenous contrast. Multidetector CT imaging of the cervical spine was performed without intravenous contrast. Multiplanar CT image reconstructions were also generated. Multidetector CT imaging of the chest, abdomen and pelvis was performed following the standard protocol without IV contrast. COMPARISON:  CT head 09/16/2019, MR 06/05/2018, same day chest radiograph, CT abdomen pelvis 09/17/2006 FINDINGS: CT HEAD FINDINGS Brain: No evidence of acute infarction, hemorrhage, hydrocephalus, extra-axial collection or mass lesion/mass effect. Symmetric prominence of the ventricles, cisterns and sulci compatible with parenchymal volume loss. Patchy areas of white matter hypoattenuation are most compatible with chronic microvascular angiopathy. Vascular:  Atherosclerotic calcification of the carotid siphons and intradural vertebral arteries. No hyperdense vessel. Skull: No acute scalp swelling or hematoma. No calvarial fracture or other acute or suspicious osseous lesion. Chronic left occipital scalp infiltration, likely scarring. Small right frontal scalp lipoma, stable. Sinuses/Orbits: Paranasal sinuses and mastoid air cells are predominantly clear. Pneumatization of the petrous apices. Orbital structures are unremarkable aside from prior lens extractions. Other: Extensive debris in the external auditory canals. Mild bilateral TMJ arthrosis. Largely edentulous with mild mandibular prognathism. CT CERVICAL FINDINGS Alignment: Stabilization collar is absent. Mild straightening of the upper cervical lordosis with reversal at the C4-5 levels. Mild retrolisthesis C4 on 5 and C5 on 6 is likely degenerative in nature. No evidence of traumatic listhesis. No abnormally widened, perched or jumped facets. Normal alignment of the craniocervical and atlantoaxial articulations accounting for leftward cranial rotation. Skull base and vertebrae: No acute skull base fracture. No vertebral fracture or height loss. Arthrosis present at the atlantodental interval with some mild calcific pannus formation. Multilevel spondylitic changes are detailed below. Soft tissues and spinal canal: No pre or paravertebral fluid or swelling. No visible canal hematoma. Enthesopathic changes in the nuchal ligament. Disc levels: Multilevel intervertebral disc height loss with spondylitic endplate changes. Features maximal C4-5 and C5-6 with small posterior disc osteophyte complexes resulting in at most mild canal stenosis. Uncinate spurring facet hypertrophic changes are maximal at these levels as well with moderate to severe stenosis on the left at C5-6 and more moderate changes on the right at C4-5. Additional multilevel foraminal narrowing is mild. Other: Secretions noted in the posterior naso and  oropharynx above the level of the inflated endotracheal balloon. Extensive cervical carotid atherosclerosis. No concerning thyroid nodules. CT CHEST FINDINGS Cardiovascular: Limited assessment of the vasculature in the absence of contrast media. Cardiac size within normal limits. Small pericardial effusion. Extensive three-vessel coronary artery disease. Extensive calcifications upon the aortic valve leaflets. Atherosclerotic plaque within the normal caliber aorta. Shared origin of brachiocephalic and left common  carotid artery origins. Calcific plaque throughout the proximal great vessels. Central pulmonary arteries are borderline enlarged. Luminal evaluation precluded in the absence of contrast media. Mediastinum/Nodes: No mediastinal fluid or gas. Normal thyroid gland and thoracic inlet. No worrisome mediastinal or axillary adenopathy. Hilar nodal evaluation is limited in the absence of intravenous contrast media. Endotracheal tube terminates in the mid to lower trachea, 3 cm from the carina. Transesophageal tube tip terminates in the gastric body with side port distal to the GE junction. Lungs/Pleura: Heterogeneous dependent areas of mixed ground-glass and dense consolidative opacities are present in both lungs with multiple fluid-filled dependent airways as well as interlobular septal thickening and fissural thickening and small bilateral pleural effusions with further atelectatic changes and volume loss as well. No pneumothorax. Musculoskeletal: Posterior left third through sixth left rib fractures. Posterior right rib 4 rib fractures. No other acute osseous injuries in the chest wall. Asymmetric sternoclavicular arthrosis, increased on the right. Degenerative changes in both shoulders. Multilevel degenerative changes present spine Multilevel flowing anterior osteophytosis, compatible with features of diffuse idiopathic skeletal hyperostosis (DISH). Mild body wall edema. CT ABDOMEN AND PELVIS FINDINGS  Hepatobiliary: No visible hepatic injury or concerning liver lesion on this unenhanced CT. No perihepatic hematoma. Smooth surface contour. Normal attenuation. Patient is post cholecystectomy. Slight prominence of the biliary tree likely related to post cholecystectomy reservoir effect without visible calcified intraductal gallstones. Pancreas: Moderate pancreatic atrophy and partial fatty replacement of the pancreas. No ductal dilatation, discernible lesions, or inflammation. Spleen: No direct splenic injury. No perisplenic hematoma. Normal splenic size. No concerning lesions. Few scattered vascular calcifications. Adrenals/Urinary Tract: Mild lobular thickening of the adrenal gland could reflect some senescent hyperplasia without concerning nodules. No evidence of adrenal hemorrhage. No direct renal injury or perinephric hematoma. Bilateral renal atrophy with mild symmetric perinephric stranding, possibly related to senescent change or diminished renal function. Few vascular calcifications are present in both kidneys. Collecting system calculus seen in the upper pole right kidney. No obstructive urolithiasis is evident. Hyperdense 18 mm cystic structure arising from the posterior interpolar right kidney (3/72) additional subcentimeter hyperdense cystic structure arising from the upper pole left kidney (3/65). No other concerning renal lesions. Urinary bladder is largely decompressed by Foley catheter at the time of exam and therefore poorly evaluated by CT imaging. Further obscuration by streak artifact from a hip prosthesis. Stomach/Bowel: Transesophageal tube in appropriate position terminating in the gastric body with side port distal to the GE junction as detailed above. Mild tenting of the anterior stomach is a typical finding for placement of a transesophageal tube. Small duodenal lipoma. Small distal duodenal diverticulum (3/69). No small bowel thickening or dilatation. Mild fecalization of the distal small  bowel contents can reflect some slowed intestinal transit Appendix is not visualized. No focal inflammation the vicinity of the cecum to suggest an occult appendicitis. No colonic dilatation or wall thickening. Vascular/Lymphatic: Extensive atherosclerotic calcification throughout the abdominal aorta and branch vessels. Luminal evaluation precluded in the absence of contrast media. No aneurysm or ectasia. No periaortic stranding or hemorrhage. No suspicious or enlarged lymph nodes in the included lymphatic chains. Reproductive: In prostate traversed by the Foley catheter. Coarse eccentric calcification of the prostate. No concerning abnormalities of the prostate or seminal vesicles. Nonspecific penile calcifications are noted. Other: No abdominopelvic free air or fluid. No bowel containing hernia. Small fat containing umbilical hernia. No traumatic abdominal wall dehiscence. No body wall or retroperitoneal hematoma. Musculoskeletal: The osseous structures appear diffusely demineralized which may limit detection of  small or nondisplaced fractures. No acute osseous abnormality or suspicious osseous lesion. Extensive streak artifact related to patient's right hip arthroplasty. Multilevel degenerative changes are present in the imaged portions of the spine. Additional degenerative changes in the hips and pelvis. No acute osseous abnormality of the included upper extremities. IMPRESSION: CT HEAD: 1. No acute intracranial abnormality. 2. Stable parenchymal volume loss and chronic microvascular ischemic changes. 3. Debris in the external auditory canals. CT CERVICAL SPINE: 1. No acute cervical spine fracture or traumatic listhesis. 2. Multilevel degenerative changes of the cervical spine, as described above. CT CHEST, ABDOMEN AND PELVIS 1. Unenhanced CT was performed per clinician order. Lack of IV contrast limits sensitivity and specificity, especially for evaluation of the vasculature and abdominal/pelvic solid viscera.  2. Posterior left third through sixth rib fractures. Posterior right rib 4 rib fractures. Can be seen in the setting of resuscitative efforts. No pneumothorax. 3. Heterogeneous largely dependent areas of mixed ground-glass and dense consolidation in both lungs with fluid-filled dependent airways as well as septal and fissural thickening, and small bilateral pleural effusions as well as features of volume loss. Such findings are worrisome for sequela of aspiration likely with superimposed pulmonary edema and volume loss. Mild contusive changes may also be present in the setting of resuscitation. 4. Endotracheal tube in the mid to lower trachea, 3 cm from the carina. 5. Transesophageal tube in appropriate position terminating in the gastric body with side port distal to the GE junction. 6. No other acute abdominopelvic abnormalities. 7. Nonobstructing right nephrolithiasis. 8. Hyperdense cystic structures arising from the lateral right interpolar kidney and upper pole left kidney., possible hyperdense exophytic cyst. Recommend outpatient renal ultrasound. 9. Aortic Atherosclerosis (ICD10-I70.0). Electronically Signed   By: Lovena Le M.D.   On: 11/30/2019 02:57   CT Chest Wo Contrast  Result Date: 11/30/2019 CLINICAL DATA:  Found down with seizure-like activity, V-tach, seizure while in ED EXAM: CT HEAD WITHOUT CONTRAST CT CERVICAL SPINE WITHOUT CONTRAST CT CHEST, ABDOMEN AND PELVIS WITHOUT CONTRAST TECHNIQUE: Contiguous axial images were obtained from the base of the skull through the vertex without intravenous contrast. Multidetector CT imaging of the cervical spine was performed without intravenous contrast. Multiplanar CT image reconstructions were also generated. Multidetector CT imaging of the chest, abdomen and pelvis was performed following the standard protocol without IV contrast. COMPARISON:  CT head 09/16/2019, MR 06/05/2018, same day chest radiograph, CT abdomen pelvis 09/17/2006 FINDINGS: CT HEAD  FINDINGS Brain: No evidence of acute infarction, hemorrhage, hydrocephalus, extra-axial collection or mass lesion/mass effect. Symmetric prominence of the ventricles, cisterns and sulci compatible with parenchymal volume loss. Patchy areas of white matter hypoattenuation are most compatible with chronic microvascular angiopathy. Vascular: Atherosclerotic calcification of the carotid siphons and intradural vertebral arteries. No hyperdense vessel. Skull: No acute scalp swelling or hematoma. No calvarial fracture or other acute or suspicious osseous lesion. Chronic left occipital scalp infiltration, likely scarring. Small right frontal scalp lipoma, stable. Sinuses/Orbits: Paranasal sinuses and mastoid air cells are predominantly clear. Pneumatization of the petrous apices. Orbital structures are unremarkable aside from prior lens extractions. Other: Extensive debris in the external auditory canals. Mild bilateral TMJ arthrosis. Largely edentulous with mild mandibular prognathism. CT CERVICAL FINDINGS Alignment: Stabilization collar is absent. Mild straightening of the upper cervical lordosis with reversal at the C4-5 levels. Mild retrolisthesis C4 on 5 and C5 on 6 is likely degenerative in nature. No evidence of traumatic listhesis. No abnormally widened, perched or jumped facets. Normal alignment of the craniocervical and atlantoaxial  articulations accounting for leftward cranial rotation. Skull base and vertebrae: No acute skull base fracture. No vertebral fracture or height loss. Arthrosis present at the atlantodental interval with some mild calcific pannus formation. Multilevel spondylitic changes are detailed below. Soft tissues and spinal canal: No pre or paravertebral fluid or swelling. No visible canal hematoma. Enthesopathic changes in the nuchal ligament. Disc levels: Multilevel intervertebral disc height loss with spondylitic endplate changes. Features maximal C4-5 and C5-6 with small posterior disc  osteophyte complexes resulting in at most mild canal stenosis. Uncinate spurring facet hypertrophic changes are maximal at these levels as well with moderate to severe stenosis on the left at C5-6 and more moderate changes on the right at C4-5. Additional multilevel foraminal narrowing is mild. Other: Secretions noted in the posterior naso and oropharynx above the level of the inflated endotracheal balloon. Extensive cervical carotid atherosclerosis. No concerning thyroid nodules. CT CHEST FINDINGS Cardiovascular: Limited assessment of the vasculature in the absence of contrast media. Cardiac size within normal limits. Small pericardial effusion. Extensive three-vessel coronary artery disease. Extensive calcifications upon the aortic valve leaflets. Atherosclerotic plaque within the normal caliber aorta. Shared origin of brachiocephalic and left common carotid artery origins. Calcific plaque throughout the proximal great vessels. Central pulmonary arteries are borderline enlarged. Luminal evaluation precluded in the absence of contrast media. Mediastinum/Nodes: No mediastinal fluid or gas. Normal thyroid gland and thoracic inlet. No worrisome mediastinal or axillary adenopathy. Hilar nodal evaluation is limited in the absence of intravenous contrast media. Endotracheal tube terminates in the mid to lower trachea, 3 cm from the carina. Transesophageal tube tip terminates in the gastric body with side port distal to the GE junction. Lungs/Pleura: Heterogeneous dependent areas of mixed ground-glass and dense consolidative opacities are present in both lungs with multiple fluid-filled dependent airways as well as interlobular septal thickening and fissural thickening and small bilateral pleural effusions with further atelectatic changes and volume loss as well. No pneumothorax. Musculoskeletal: Posterior left third through sixth left rib fractures. Posterior right rib 4 rib fractures. No other acute osseous injuries in  the chest wall. Asymmetric sternoclavicular arthrosis, increased on the right. Degenerative changes in both shoulders. Multilevel degenerative changes present spine Multilevel flowing anterior osteophytosis, compatible with features of diffuse idiopathic skeletal hyperostosis (DISH). Mild body wall edema. CT ABDOMEN AND PELVIS FINDINGS Hepatobiliary: No visible hepatic injury or concerning liver lesion on this unenhanced CT. No perihepatic hematoma. Smooth surface contour. Normal attenuation. Patient is post cholecystectomy. Slight prominence of the biliary tree likely related to post cholecystectomy reservoir effect without visible calcified intraductal gallstones. Pancreas: Moderate pancreatic atrophy and partial fatty replacement of the pancreas. No ductal dilatation, discernible lesions, or inflammation. Spleen: No direct splenic injury. No perisplenic hematoma. Normal splenic size. No concerning lesions. Few scattered vascular calcifications. Adrenals/Urinary Tract: Mild lobular thickening of the adrenal gland could reflect some senescent hyperplasia without concerning nodules. No evidence of adrenal hemorrhage. No direct renal injury or perinephric hematoma. Bilateral renal atrophy with mild symmetric perinephric stranding, possibly related to senescent change or diminished renal function. Few vascular calcifications are present in both kidneys. Collecting system calculus seen in the upper pole right kidney. No obstructive urolithiasis is evident. Hyperdense 18 mm cystic structure arising from the posterior interpolar right kidney (3/72) additional subcentimeter hyperdense cystic structure arising from the upper pole left kidney (3/65). No other concerning renal lesions. Urinary bladder is largely decompressed by Foley catheter at the time of exam and therefore poorly evaluated by CT imaging. Further obscuration by streak  artifact from a hip prosthesis. Stomach/Bowel: Transesophageal tube in appropriate  position terminating in the gastric body with side port distal to the GE junction as detailed above. Mild tenting of the anterior stomach is a typical finding for placement of a transesophageal tube. Small duodenal lipoma. Small distal duodenal diverticulum (3/69). No small bowel thickening or dilatation. Mild fecalization of the distal small bowel contents can reflect some slowed intestinal transit Appendix is not visualized. No focal inflammation the vicinity of the cecum to suggest an occult appendicitis. No colonic dilatation or wall thickening. Vascular/Lymphatic: Extensive atherosclerotic calcification throughout the abdominal aorta and branch vessels. Luminal evaluation precluded in the absence of contrast media. No aneurysm or ectasia. No periaortic stranding or hemorrhage. No suspicious or enlarged lymph nodes in the included lymphatic chains. Reproductive: In prostate traversed by the Foley catheter. Coarse eccentric calcification of the prostate. No concerning abnormalities of the prostate or seminal vesicles. Nonspecific penile calcifications are noted. Other: No abdominopelvic free air or fluid. No bowel containing hernia. Small fat containing umbilical hernia. No traumatic abdominal wall dehiscence. No body wall or retroperitoneal hematoma. Musculoskeletal: The osseous structures appear diffusely demineralized which may limit detection of small or nondisplaced fractures. No acute osseous abnormality or suspicious osseous lesion. Extensive streak artifact related to patient's right hip arthroplasty. Multilevel degenerative changes are present in the imaged portions of the spine. Additional degenerative changes in the hips and pelvis. No acute osseous abnormality of the included upper extremities. IMPRESSION: CT HEAD: 1. No acute intracranial abnormality. 2. Stable parenchymal volume loss and chronic microvascular ischemic changes. 3. Debris in the external auditory canals. CT CERVICAL SPINE: 1. No acute  cervical spine fracture or traumatic listhesis. 2. Multilevel degenerative changes of the cervical spine, as described above. CT CHEST, ABDOMEN AND PELVIS 1. Unenhanced CT was performed per clinician order. Lack of IV contrast limits sensitivity and specificity, especially for evaluation of the vasculature and abdominal/pelvic solid viscera. 2. Posterior left third through sixth rib fractures. Posterior right rib 4 rib fractures. Can be seen in the setting of resuscitative efforts. No pneumothorax. 3. Heterogeneous largely dependent areas of mixed ground-glass and dense consolidation in both lungs with fluid-filled dependent airways as well as septal and fissural thickening, and small bilateral pleural effusions as well as features of volume loss. Such findings are worrisome for sequela of aspiration likely with superimposed pulmonary edema and volume loss. Mild contusive changes may also be present in the setting of resuscitation. 4. Endotracheal tube in the mid to lower trachea, 3 cm from the carina. 5. Transesophageal tube in appropriate position terminating in the gastric body with side port distal to the GE junction. 6. No other acute abdominopelvic abnormalities. 7. Nonobstructing right nephrolithiasis. 8. Hyperdense cystic structures arising from the lateral right interpolar kidney and upper pole left kidney., possible hyperdense exophytic cyst. Recommend outpatient renal ultrasound. 9. Aortic Atherosclerosis (ICD10-I70.0). Electronically Signed   By: Lovena Le M.D.   On: 11/30/2019 02:57   CT Cervical Spine Wo Contrast  Result Date: 11/30/2019 CLINICAL DATA:  Found down with seizure-like activity, V-tach, seizure while in ED EXAM: CT HEAD WITHOUT CONTRAST CT CERVICAL SPINE WITHOUT CONTRAST CT CHEST, ABDOMEN AND PELVIS WITHOUT CONTRAST TECHNIQUE: Contiguous axial images were obtained from the base of the skull through the vertex without intravenous contrast. Multidetector CT imaging of the cervical  spine was performed without intravenous contrast. Multiplanar CT image reconstructions were also generated. Multidetector CT imaging of the chest, abdomen and pelvis was performed following the standard  protocol without IV contrast. COMPARISON:  CT head 09/16/2019, MR 06/05/2018, same day chest radiograph, CT abdomen pelvis 09/17/2006 FINDINGS: CT HEAD FINDINGS Brain: No evidence of acute infarction, hemorrhage, hydrocephalus, extra-axial collection or mass lesion/mass effect. Symmetric prominence of the ventricles, cisterns and sulci compatible with parenchymal volume loss. Patchy areas of white matter hypoattenuation are most compatible with chronic microvascular angiopathy. Vascular: Atherosclerotic calcification of the carotid siphons and intradural vertebral arteries. No hyperdense vessel. Skull: No acute scalp swelling or hematoma. No calvarial fracture or other acute or suspicious osseous lesion. Chronic left occipital scalp infiltration, likely scarring. Small right frontal scalp lipoma, stable. Sinuses/Orbits: Paranasal sinuses and mastoid air cells are predominantly clear. Pneumatization of the petrous apices. Orbital structures are unremarkable aside from prior lens extractions. Other: Extensive debris in the external auditory canals. Mild bilateral TMJ arthrosis. Largely edentulous with mild mandibular prognathism. CT CERVICAL FINDINGS Alignment: Stabilization collar is absent. Mild straightening of the upper cervical lordosis with reversal at the C4-5 levels. Mild retrolisthesis C4 on 5 and C5 on 6 is likely degenerative in nature. No evidence of traumatic listhesis. No abnormally widened, perched or jumped facets. Normal alignment of the craniocervical and atlantoaxial articulations accounting for leftward cranial rotation. Skull base and vertebrae: No acute skull base fracture. No vertebral fracture or height loss. Arthrosis present at the atlantodental interval with some mild calcific pannus  formation. Multilevel spondylitic changes are detailed below. Soft tissues and spinal canal: No pre or paravertebral fluid or swelling. No visible canal hematoma. Enthesopathic changes in the nuchal ligament. Disc levels: Multilevel intervertebral disc height loss with spondylitic endplate changes. Features maximal C4-5 and C5-6 with small posterior disc osteophyte complexes resulting in at most mild canal stenosis. Uncinate spurring facet hypertrophic changes are maximal at these levels as well with moderate to severe stenosis on the left at C5-6 and more moderate changes on the right at C4-5. Additional multilevel foraminal narrowing is mild. Other: Secretions noted in the posterior naso and oropharynx above the level of the inflated endotracheal balloon. Extensive cervical carotid atherosclerosis. No concerning thyroid nodules. CT CHEST FINDINGS Cardiovascular: Limited assessment of the vasculature in the absence of contrast media. Cardiac size within normal limits. Small pericardial effusion. Extensive three-vessel coronary artery disease. Extensive calcifications upon the aortic valve leaflets. Atherosclerotic plaque within the normal caliber aorta. Shared origin of brachiocephalic and left common carotid artery origins. Calcific plaque throughout the proximal great vessels. Central pulmonary arteries are borderline enlarged. Luminal evaluation precluded in the absence of contrast media. Mediastinum/Nodes: No mediastinal fluid or gas. Normal thyroid gland and thoracic inlet. No worrisome mediastinal or axillary adenopathy. Hilar nodal evaluation is limited in the absence of intravenous contrast media. Endotracheal tube terminates in the mid to lower trachea, 3 cm from the carina. Transesophageal tube tip terminates in the gastric body with side port distal to the GE junction. Lungs/Pleura: Heterogeneous dependent areas of mixed ground-glass and dense consolidative opacities are present in both lungs with  multiple fluid-filled dependent airways as well as interlobular septal thickening and fissural thickening and small bilateral pleural effusions with further atelectatic changes and volume loss as well. No pneumothorax. Musculoskeletal: Posterior left third through sixth left rib fractures. Posterior right rib 4 rib fractures. No other acute osseous injuries in the chest wall. Asymmetric sternoclavicular arthrosis, increased on the right. Degenerative changes in both shoulders. Multilevel degenerative changes present spine Multilevel flowing anterior osteophytosis, compatible with features of diffuse idiopathic skeletal hyperostosis (DISH). Mild body wall edema. CT ABDOMEN AND PELVIS FINDINGS  Hepatobiliary: No visible hepatic injury or concerning liver lesion on this unenhanced CT. No perihepatic hematoma. Smooth surface contour. Normal attenuation. Patient is post cholecystectomy. Slight prominence of the biliary tree likely related to post cholecystectomy reservoir effect without visible calcified intraductal gallstones. Pancreas: Moderate pancreatic atrophy and partial fatty replacement of the pancreas. No ductal dilatation, discernible lesions, or inflammation. Spleen: No direct splenic injury. No perisplenic hematoma. Normal splenic size. No concerning lesions. Few scattered vascular calcifications. Adrenals/Urinary Tract: Mild lobular thickening of the adrenal gland could reflect some senescent hyperplasia without concerning nodules. No evidence of adrenal hemorrhage. No direct renal injury or perinephric hematoma. Bilateral renal atrophy with mild symmetric perinephric stranding, possibly related to senescent change or diminished renal function. Few vascular calcifications are present in both kidneys. Collecting system calculus seen in the upper pole right kidney. No obstructive urolithiasis is evident. Hyperdense 18 mm cystic structure arising from the posterior interpolar right kidney (3/72) additional  subcentimeter hyperdense cystic structure arising from the upper pole left kidney (3/65). No other concerning renal lesions. Urinary bladder is largely decompressed by Foley catheter at the time of exam and therefore poorly evaluated by CT imaging. Further obscuration by streak artifact from a hip prosthesis. Stomach/Bowel: Transesophageal tube in appropriate position terminating in the gastric body with side port distal to the GE junction as detailed above. Mild tenting of the anterior stomach is a typical finding for placement of a transesophageal tube. Small duodenal lipoma. Small distal duodenal diverticulum (3/69). No small bowel thickening or dilatation. Mild fecalization of the distal small bowel contents can reflect some slowed intestinal transit Appendix is not visualized. No focal inflammation the vicinity of the cecum to suggest an occult appendicitis. No colonic dilatation or wall thickening. Vascular/Lymphatic: Extensive atherosclerotic calcification throughout the abdominal aorta and branch vessels. Luminal evaluation precluded in the absence of contrast media. No aneurysm or ectasia. No periaortic stranding or hemorrhage. No suspicious or enlarged lymph nodes in the included lymphatic chains. Reproductive: In prostate traversed by the Foley catheter. Coarse eccentric calcification of the prostate. No concerning abnormalities of the prostate or seminal vesicles. Nonspecific penile calcifications are noted. Other: No abdominopelvic free air or fluid. No bowel containing hernia. Small fat containing umbilical hernia. No traumatic abdominal wall dehiscence. No body wall or retroperitoneal hematoma. Musculoskeletal: The osseous structures appear diffusely demineralized which may limit detection of small or nondisplaced fractures. No acute osseous abnormality or suspicious osseous lesion. Extensive streak artifact related to patient's right hip arthroplasty. Multilevel degenerative changes are present in  the imaged portions of the spine. Additional degenerative changes in the hips and pelvis. No acute osseous abnormality of the included upper extremities. IMPRESSION: CT HEAD: 1. No acute intracranial abnormality. 2. Stable parenchymal volume loss and chronic microvascular ischemic changes. 3. Debris in the external auditory canals. CT CERVICAL SPINE: 1. No acute cervical spine fracture or traumatic listhesis. 2. Multilevel degenerative changes of the cervical spine, as described above. CT CHEST, ABDOMEN AND PELVIS 1. Unenhanced CT was performed per clinician order. Lack of IV contrast limits sensitivity and specificity, especially for evaluation of the vasculature and abdominal/pelvic solid viscera. 2. Posterior left third through sixth rib fractures. Posterior right rib 4 rib fractures. Can be seen in the setting of resuscitative efforts. No pneumothorax. 3. Heterogeneous largely dependent areas of mixed ground-glass and dense consolidation in both lungs with fluid-filled dependent airways as well as septal and fissural thickening, and small bilateral pleural effusions as well as features of volume loss. Such findings are  worrisome for sequela of aspiration likely with superimposed pulmonary edema and volume loss. Mild contusive changes may also be present in the setting of resuscitation. 4. Endotracheal tube in the mid to lower trachea, 3 cm from the carina. 5. Transesophageal tube in appropriate position terminating in the gastric body with side port distal to the GE junction. 6. No other acute abdominopelvic abnormalities. 7. Nonobstructing right nephrolithiasis. 8. Hyperdense cystic structures arising from the lateral right interpolar kidney and upper pole left kidney., possible hyperdense exophytic cyst. Recommend outpatient renal ultrasound. 9. Aortic Atherosclerosis (ICD10-I70.0). Electronically Signed   By: Lovena Le M.D.   On: 11/30/2019 02:57   DG Chest Portable 1 View  Result Date:  11/30/2019 CLINICAL DATA:  Shortness of breath EXAM: PORTABLE CHEST 1 VIEW COMPARISON:  09/16/2019 FINDINGS: Mild cardiomegaly. Diffuse interstitial opacity. No pleural effusion or pneumothorax. No focal airspace consolidation. IMPRESSION: Cardiomegaly and mild pulmonary edema. Electronically Signed   By: Ulyses Jarred M.D.   On: 11/30/2019 01:55    EKG: Atrial fibrillation with rapid ventricular rate nonspecific ST and T wave changes  Weights: Filed Weights   11/30/19 0117 11/30/19 0444  Weight: 80.7 kg 81.4 kg     Physical Exam: Blood pressure (!) 120/58, pulse (!) 58, temperature 98.4 F (36.9 C), resp. rate (!) 22, height '5\' 7"'  (1.702 m), weight 81.4 kg, SpO2 100 %. Body mass index is 28.11 kg/m. General: Well developed, well nourished, in no acute distress. Head eyes ears nose throat: Normocephalic, atraumatic, sclera non-icteric, no xanthomas, nares are without discharge. No apparent thyromegaly and/or mass  Lungs: Normal respiratory effort.  Diffuse wheezes, no rales, no rhonchi.  Heart: RRR with normal S1 S2. no murmur gallop, no rub, PMI is normal size and placement, carotid upstroke normal without bruit, jugular venous pressure is normal Abdomen: Soft, non-tender, non-distended with normoactive bowel sounds. No hepatomegaly. No rebound/guarding. No obvious abdominal masses. Abdominal aorta is normal size without bruit Extremities: No edema. no cyanosis, no clubbing, no ulcers  Peripheral : 2+ bilateral upper extremity pulses, 2+ bilateral femoral pulses, bilateral BKA Neuro: Not alert and oriented. No facial asymmetry. No focal deficit. Moves all extremities spontaneously. Musculoskeletal: Normal muscle tone without kyphosis Psych: Does not responds to questions appropriately with a normal affect.    Assessment: 85 year old male with severe peripheral vascular disease diabetes hypertension hyperlipidemia paroxysmal nonvalvular atrial fibrillation chronic kidney disease with  acute respiratory failure hypoxia exacerbating atrial fibrillation now converted into normal rhythm after addition of amiodarone.  Without evidence of significant myocardial infarction   Plan: 1.  Continue supportive care for acute respiratory failure which may have combined congestive heart failur  2.  Diuresis with furosemide as necessary 3.  Continue amiodarone orally and 400 mg for maintenance of normal sinus rhythm 4.  Anticoagulation with Xarelto for risk reduction in stroke with atrial fibrillation as well as peripheral vascular disease complication 5.  No further cardiac diagnostics necessary at this time due to no significant evidence of new changes other than above 6.  Further adjustments of medication management after above  Signed, Corey Skains M.D. [facility] 11/30/2019, 6:13 PM

## 2019-11-30 NOTE — ED Notes (Signed)
Bolus of 400o units of heparin

## 2019-12-01 ENCOUNTER — Inpatient Hospital Stay: Payer: Medicare Other

## 2019-12-01 LAB — COMPREHENSIVE METABOLIC PANEL
ALT: 23 U/L (ref 0–44)
AST: 26 U/L (ref 15–41)
Albumin: 2.5 g/dL — ABNORMAL LOW (ref 3.5–5.0)
Alkaline Phosphatase: 81 U/L (ref 38–126)
Anion gap: 7 (ref 5–15)
BUN: 32 mg/dL — ABNORMAL HIGH (ref 8–23)
CO2: 25 mmol/L (ref 22–32)
Calcium: 9.4 mg/dL (ref 8.9–10.3)
Chloride: 104 mmol/L (ref 98–111)
Creatinine, Ser: 1.52 mg/dL — ABNORMAL HIGH (ref 0.61–1.24)
GFR calc Af Amer: 49 mL/min — ABNORMAL LOW (ref >60)
GFR calc non Af Amer: 42 mL/min — ABNORMAL LOW (ref >60)
Glucose, Bld: 185 mg/dL — ABNORMAL HIGH (ref 70–99)
Potassium: 3.9 mmol/L (ref 3.5–5.1)
Sodium: 136 mmol/L (ref 135–145)
Total Bilirubin: 0.9 mg/dL (ref 0.3–1.2)
Total Protein: 5.6 g/dL — ABNORMAL LOW (ref 6.5–8.1)

## 2019-12-01 LAB — GLUCOSE, CAPILLARY
Glucose-Capillary: 187 mg/dL — ABNORMAL HIGH (ref 70–99)
Glucose-Capillary: 174 mg/dL — ABNORMAL HIGH (ref 70–99)
Glucose-Capillary: 153 mg/dL — ABNORMAL HIGH (ref 70–99)
Glucose-Capillary: 132 mg/dL — ABNORMAL HIGH (ref 70–99)
Glucose-Capillary: 137 mg/dL — ABNORMAL HIGH (ref 70–99)

## 2019-12-01 LAB — CBC WITH DIFFERENTIAL/PLATELET
Abs Immature Granulocytes: 0.09 10*3/uL — ABNORMAL HIGH (ref 0.00–0.07)
Basophils Absolute: 0.1 10*3/uL (ref 0.0–0.1)
Basophils Relative: 0 %
Eosinophils Absolute: 0.3 10*3/uL (ref 0.0–0.5)
Eosinophils Relative: 2 %
HCT: 36.9 % — ABNORMAL LOW (ref 39.0–52.0)
Hemoglobin: 12.7 g/dL — ABNORMAL LOW (ref 13.0–17.0)
Immature Granulocytes: 1 %
Lymphocytes Relative: 7 %
Lymphs Abs: 1.3 10*3/uL (ref 0.7–4.0)
MCH: 29.5 pg (ref 26.0–34.0)
MCHC: 34.4 g/dL (ref 30.0–36.0)
MCV: 85.8 fL (ref 80.0–100.0)
Monocytes Absolute: 1.4 10*3/uL — ABNORMAL HIGH (ref 0.1–1.0)
Monocytes Relative: 8 %
Neutro Abs: 14.1 10*3/uL — ABNORMAL HIGH (ref 1.7–7.7)
Neutrophils Relative %: 82 %
Platelets: 204 10*3/uL (ref 150–400)
RBC: 4.3 MIL/uL (ref 4.22–5.81)
RDW: 14.4 % (ref 11.5–15.5)
WBC: 17.2 10*3/uL — ABNORMAL HIGH (ref 4.0–10.5)
nRBC: 0 % (ref 0.0–0.2)

## 2019-12-01 LAB — PROCALCITONIN: Procalcitonin: 1.55 ng/mL

## 2019-12-01 LAB — MAGNESIUM: Magnesium: 1.9 mg/dL (ref 1.7–2.4)

## 2019-12-01 NOTE — Progress Notes (Signed)
PULMONARY / CRITICAL CARE MEDICINE   Name: [name] MRN: [id number] DOB: 1934-09-13    ADMISSION DATE:  11/30/2019 CONSULTATION DATE:  11/30/2019  REFERRING MD:  Dr. Alfred Levins  CHIEF COMPLAINT:  Tonic-Clonic Seizure, Hyperglycemia  BRIEF DISCUSSION: 85 y.o. Male admitted 11/30/19 with Tonic-Clonic seizure, Acute Hypoxic Hypercapnic Respiratory Failure requiring intubation secondary to Aspiration Pneumonia & Pulmonary Edema, A-fib w/ RVR, intermittent nonsustained V-tach, questionable NSTEMI, and HHS.  Cardiology and Neurology are consulted.  HISTORY OF PRESENT ILLNESS:   [name] is a 85 year old male with a past medical history significant for atrial fibrillation on Xarelto, HFpEF, CKD, diabetes mellitus, bilateral BKA's, PVD, sleep apnea, and COVID-19 infection in September 2020 who presents to [facility] ED on 11/30/2019 due to syncope and hyperglycemia.  Patient is currently intubated and sedated with no family present, therefore history is obtained from ED and nursing notes.  Per notes patient had a syncopal episode this evening, and when EMS arrived they found him face down on the floor very confused (he was noted to have a pulse and spontaneous breathing).  CBG was too high to read.  Patient was combative of which he was given 2 mg of IV Versed.  While in route to the hospital with EMS he went into V. tach and received 150 mg of amiodarone.  Upon arrival to the ED he was very agitated, not answering questions, with increased work of breathing and gurgling respirations.  EKG with atrial fibrillation with diffuse deep ST depressions and elevations and the anterior leads, with intermittent runs of V. tach despite receiving 150 mg of amiodarone in route to the hospital with EMS.  ED provider consulted with Dr. Fletcher Anon of Cardiology who evaluated the EKG and determined the patient did not meet STEMI criteria.  Cardiology recommended starting patient on heparin and continuing amiodarone drip.  Patient was noted to hypoxic with bilateral crackles for which he was subsequently placed on BiPAP.  He was noted to have a  witnessed generalized tonic-clonic seizure which he was given 2 mg of IV Ativan and was intubated for airway protection (noted to have some vomit in his posterior oropharynx during intubation concerning for aspiration).    CT of the head was negative for any acute intracranial process, CT cervical spine is negative, and CT chest/abdomen/pelvis with posterior left rib fractures (no Pneumothorax), aspiration pneumonia, pulmonary edema, good positioning of the ETT, and non-obstructing right Neprholithiasis.  Patient again developed A. fib with RVR which he received IV metoprolol. Lab work shows glucose 679, BUN 28, Creatinine 1.72, anion gap 16, BNP 390, high sensitivity troponin 39, lactic acid 7.1, WBC 21.8 (with Neutrophilia).  His COVID-19 PCR is negative. Urinalysis negative for ketones and negative for UTI.  Given his severely elevated glucose he was placed on insulin drip.  PCCM is asked to admit the pt to ICU for further workup and treatment of Tonic-Clonic seizure, Acute Hypoxic Hypercapnic Respiratory Failure secondary to Aspiration Pneumonia & Pulmonary Edema, A-fib w/ RVR, intermittent nonsustained V-tach, and questionable NSTEMI.  Cardiology and Neurology are consulted.     11/30/19- Patient remains on MV weaning FiO2 to 80%, s/p TTE this AM, plan for EEG tonight. Discussed short term care plan with neurology Dr Doy Mince.  Tracheal aspirate with thick solid chunks of discolored material concerning for aspiration.   12/01/19- Patient improved , FiO2 50%   PAST MEDICAL HISTORY :  He  has a past medical history of Arthritis, Atrial fibrillation ([place]), Carcinoma of prostate ([place]), CHF (congestive  heart failure) ([place]), Chronic kidney disease, Diabetes mellitus without complication ([place]), ED (erectile dysfunction), Frequent urination, Hyperlipidemia, Hypertension, Moderate mitral  insufficiency, Peripheral vascular disease ([place]), Pneumonia (04/2016), Prostate cancer [facility]), Sleep apnea, Ulcer of left foot due to type 2 diabetes mellitus ([place]), and Urinary stress incontinence, male.  PAST SURGICAL HISTORY: He  has a past surgical history that includes Prostate surgery; Tonsillectomy; Eye surgery (Bilateral); Cholecystectomy; Lower Extremity Angiography (Left, 08/16/2016); Lower Extremity Angiography (Left, 09/01/2016); LOWER EXTREMITY INTERVENTION (09/01/2016); Irrigation and debridement foot (Left, 10/15/2016); Apligraft placement (Left, 10/15/2016); Lower Extremity Angiography (Left, 10/14/2016); Irrigation and debridement foot (Left, 12/09/2016); Lower Extremity Angiography (Left, 12/20/2016); Amputation (Left, 01/12/2017); LEFT HEART CATH AND CORONARY ANGIOGRAPHY (N/A, 06/05/2018); Hip Arthroplasty (Right, 10/13/2018); Lower Extremity Angiography (Right, 12/18/2018); Irrigation and debridement foot (Right, 12/23/2018); Esophagogastroduodenoscopy (egd) with propofol (N/A, 01/02/2019); Colonoscopy with propofol (N/A, 01/02/2019); Colonoscopy with propofol (N/A, 01/05/2019); Amputation (Right, 12/27/2018); and Left Heart Cath (N/A, 06/25/2019).  Allergies  Allergen Reactions  . Contrast Media [Iodinated Diagnostic Agents] Shortness Of Breath    SOB  . Iohexol Shortness Of Breath     Desc: Respiratory Distress, Laryngedema, diaphoresis, Onset Date: [id number]   . Metrizamide Shortness Of Breath    SOB SOB SOB   . Latex Rash    No current facility-administered medications on file prior to encounter.   Current Outpatient Medications on File Prior to Encounter  Medication Sig  . acetaminophen (TYLENOL) 325 MG tablet Take 650 mg by mouth every 6 (six) hours as needed.  Marland Kitchen amiodarone (PACERONE) 200 MG tablet Take 200 mg by mouth daily.  Marland Kitchen aspirin EC 81 MG tablet Take 1 tablet (81 mg total) by mouth daily.  Marland Kitchen atorvastatin (LIPITOR) 80 MG tablet Take 80 mg by mouth every evening.  . bisacodyl  (DULCOLAX) 5 MG EC tablet Take 1 tablet (5 mg total) by mouth daily as needed for moderate constipation.  . chlorthalidone (HYGROTON) 25 MG tablet Take 25 mg by mouth daily.  . Cholecalciferol (VITAMIN D-3) 25 MCG (1000 UT) CAPS Take by mouth.  . collagenase (SANTYL) ointment Apply 1 application topically daily. (Patient not taking: Reported on 06/24/2019)  . diltiazem (CARDIZEM CD) 120 MG 24 hr capsule Take 120 mg by mouth daily.  . divalproex (DEPAKOTE ER) 500 MG 24 hr tablet Take 1 tablet (500 mg total) by mouth daily.  Marland Kitchen doxycycline (VIBRAMYCIN) 100 MG capsule Take 100 mg by mouth 2 (two) times daily.  . Ensure Max Protein (ENSURE MAX PROTEIN) LIQD Take 330 mLs (11 oz total) by mouth 2 (two) times daily.  . ferrous sulfate 325 (65 FE) MG tablet Take 1 tablet (325 mg total) by mouth daily with breakfast.  . gabapentin (NEURONTIN) 300 MG capsule Take 300 mg by mouth 3 (three) times daily.   Marland Kitchen HYDROcodone-acetaminophen (NORCO/VICODIN) 5-325 MG tablet Take 1 tablet by mouth every 6 (six) hours as needed for moderate pain (pain score 4-6). (Patient not taking: Reported on 06/24/2019)  . Insulin Glargine (BASAGLAR KWIKPEN) 100 UNIT/ML SMARTSIG:15 Unit(s) SUB-Q Every Night  . insulin glargine (LANTUS) 100 UNIT/ML injection Inject 0.15 mLs (15 Units total) into the skin at bedtime.  . insulin lispro (HUMALOG) 100 UNIT/ML injection Inject 0.03 mLs (3 Units total) into the skin 3 (three) times daily with meals. (Patient taking differently: Inject 3 Units into the skin 3 (three) times daily with meals. Plus 1 additional unit for every 50 BG points over 200 (max 5 additional units per dose))  . isosorbide mononitrate (IMDUR) 60 MG 24  hr tablet Take 1 tablet (60 mg total) by mouth daily.  . metFORMIN (GLUCOPHAGE) 500 MG tablet Take by mouth daily.  . metoprolol succinate (TOPROL-XL) 25 MG 24 hr tablet Take 25 mg by mouth daily.  . metoprolol tartrate (LOPRESSOR) 25 MG tablet Take 25 mg by mouth 3 (three) times  daily.  . Multiple Vitamin (MULTIVITAMIN WITH MINERALS) TABS tablet Take 1 tablet by mouth daily.  . nitroGLYCERIN (NITROSTAT) 0.4 MG SL tablet Place 1 tablet (0.4 mg total) under the tongue every 5 (five) minutes as needed for chest pain.  Marland Kitchen NOVOLOG FLEXPEN 100 UNIT/ML FlexPen   . potassium chloride (KLOR-CON) 20 MEQ packet Take by mouth 2 (two) times daily.  . Rivaroxaban (XARELTO) 15 MG TABS tablet Take 1 tablet (15 mg total) by mouth daily with supper. Hold Xarelto for 3 days.  Start on January 09, 2019.  . senna (SENOKOT) 8.6 MG TABS tablet Take 1 tablet (8.6 mg total) by mouth daily as needed for mild constipation.  . silver sulfADIAZINE (SILVADENE) 1 % cream Apply 1 application topically daily.  . vitamin C (VITAMIN C) 250 MG tablet Take 1 tablet (250 mg total) by mouth 2 (two) times daily. (Patient not taking: Reported on 06/24/2019)  . zinc sulfate 220 (50 Zn) MG capsule Take 220 mg by mouth daily.    FAMILY HISTORY:  His He indicated that his mother is deceased. He indicated that his father is deceased. He indicated that the status of his neg hx is unknown.   SOCIAL HISTORY: He  reports that he quit smoking about 25 years ago. His smoking use included cigarettes. He smoked 0.25 packs per day. He has never used smokeless tobacco. He reports that he does not drink alcohol and does not use drugs.    REVIEW OF SYSTEMS:   Unable to assess due to intubation and sedation  SUBJECTIVE:  Unable to assess due to intubation and sedation  VITAL SIGNS: BP (!) 153/59   Pulse (!) 58   Temp 98.6 F (37 C)   Resp 15   Ht 5\' 7"  (1.702 m)   Wt 83 kg   SpO2 100%   BMI 28.66 kg/m   HEMODYNAMICS:    VENTILATOR SETTINGS: Vent Mode: PRVC FiO2 (%):  [50 %-70 %] 50 % Set Rate:  [22 bmp] 22 bmp Vt Set:  [430 mL-460 mL] 460 mL PEEP:  [8 cmH20-10 cmH20] 8 cmH20 Plateau Pressure:  [13 cmH20-17 cmH20] 16 cmH20  INTAKE / OUTPUT: I/O last 3 completed shifts: In: 2607 [I.V.:1676.1; IV  Piggyback:930.9] Out: 680 [Urine:680]  PHYSICAL EXAMINATION: General: Acute on chronically ill-appearing male, laying in bed, intubated and sedated, no acute distressj,GCS4T Neuro: Sedated, withdraws from pain, pupils PERRLA HEENT: Atraumatic, normocephalic, neck supple, no JVD Cardiovascular: Irregular irregular rhythm, no murmurs, rubs, gallops Lungs: Coarse breath sounds bilaterally, even, ventilator assisted Abdomen: Soft, nontender, nondistended, no guarding or rebound tenderness, bowel sounds positive x4 Musculoskeletal: Bilateral BKA's, no edema Skin: Warm and dry.  No obvious rashes, lesions, ulcerations  LABS:  BMET Recent Labs  Lab 11/30/19 0841 11/30/19 1306 12/01/19 1610  NA 960 DUPLICATE REQUEST  454 098  K 4.0 DUPLICATE REQUEST  4.4 3.9  CL 119 DUPLICATE REQUEST  147 829  CO2 24 DUPLICATE REQUEST  25 25  BUN 30* DUPLICATE REQUEST  32* 32*  CREATININE 5.62* DUPLICATE REQUEST  1.30* 1.52*  GLUCOSE 865* DUPLICATE REQUEST  784* 185*    Electrolytes Recent Labs  Lab 11/30/19 0109 11/30/19 0533 11/30/19  3785 11/30/19 1306 12/01/19 0422  CALCIUM 10.5*   < > 88.5* DUPLICATE REQUEST  02.7 9.4  MG 2.0  --   --   --  1.9   < > = values in this interval not displayed.    CBC Recent Labs  Lab 11/30/19 0109 12/01/19 0612  WBC 21.8* 17.2*  HGB 17.6* 12.7*  HCT 52.3* 36.9*  PLT 241 204    Coag's Recent Labs  Lab 11/30/19 0109  APTT 25  INR 1.1    Sepsis Markers Recent Labs  Lab 11/30/19 0109 11/30/19 0325 11/30/19 0533 12/01/19 0422  LATICACIDVEN 7.1*  --  5.7*  --   PROCALCITON  --  0.20  --  1.55    ABG Recent Labs  Lab 11/30/19 0314  PHART 7.16*  PCO2ART 61*  PO2ART 67*    Liver Enzymes Recent Labs  Lab 11/30/19 0109 12/01/19 0422  AST 43* 26  ALT 28 23  ALKPHOS 172* 81  BILITOT 1.3* 0.9  ALBUMIN 3.5 2.5*    Cardiac Enzymes No results for input(s): TROPONINI, PROBNP in the last 168 hours.  Glucose Recent Labs   Lab 11/30/19 1810 11/30/19 1830 11/30/19 2022 11/30/19 2319 12/01/19 0324 12/01/19 0708  GLUCAP 131* 190* 149* 172* 187* 174*    Imaging EEG  Result Date: 11/30/2019 Alexis Goodell, MD     11/30/2019  4:20 PM ELECTROENCEPHALOGRAM REPORT Patient: [name]       Room #: IC06A-AA EEG No. ID: [id number] Age: 84 y.o.        Sex: male Requesting Physician: Lanney Gins Report Date:  11/30/2019       Interpreting Physician: Alexis Goodell History: Trini Christiansen is an 85 y.o. male s/p arrest and seizure Medications: Keppra, Unasyn, Insulin, Propofol, Vasopressin Conditions of Recording:  This is a 21 channel routine scalp EEG performed with bipolar and monopolar montages arranged in accordance to the international 10/20 system of electrode placement. One channel was dedicated to EKG recording. The patient is in the intubated and sedated state. Description:  The background activity is discontinuous. It consists of low voltage bursts of polyspike, spike and slow wave activity alternating with periods of attenuation. The burst activity lasts up to 4 seconds.  The periods of attenuation last up to 6 seconds. This discontinuous activity is maintained throughout the tracing. No activation procedures were performed.  Hyperventilation and intermittent photic stimulation were not performed. IMPRESSION: This is an abnormal EEG due to a burst-suppression pattern seen throughout the tracing.  Burst suppression pattern can be seen in a variety of circumstances, including anesthesia, drug intoxication, hypothermia, as well as cerebral anoxia.  Clinical/neurological, and radiographic correlation advised.  Alexis Goodell, MD Neurology [id number] 11/30/2019, 4:16 PM   Portable Chest xray  Result Date: 12/01/2019 CLINICAL DATA:  Acute respiratory failure EXAM: PORTABLE CHEST 1 VIEW COMPARISON:  Chest x-rays dated 11/30/2019 and 09/16/2019. FINDINGS: Heart size and mediastinal contours are stable. Endotracheal  tube has been placed with tip adequately positioned approximately 4 cm above the carina. Enteric tube in place. Pacer pad overlies the LEFT heart. Central pulmonary vascular congestion and probable interstitial edema. No evidence of pneumonia. No pleural effusion or pneumothorax is seen. IMPRESSION: 1. Endotracheal tube adequately positioned with tip approximately 4 cm above the carina. 2. Probable mild interstitial edema. Electronically Signed   By: Franki Cabot M.D.   On: 12/01/2019 06:05   ECHOCARDIOGRAM COMPLETE  Result Date: 11/30/2019    ECHOCARDIOGRAM REPORT   Patient Name:  [place] Date of Exam: 11/30/2019 Medical Rec #:  [id number]             Height:       67.0 in Accession #:    [id number]            Weight:       179.5 lb Date of Birth:  [birth year]            BSA:          1.931 m Patient Age:    66 years              BP:           120/64 mmHg Patient Gender: M                     HR:           63 bpm. Exam Location:  [facility] Procedure: 2D Echo, Cardiac Doppler and Color Doppler Indications:     Abnormal ECG 794.31  History:         Patient has prior history of Echocardiogram examinations, most                  recent 06/25/2019. CHF, Arrythmias:Atrial Fibrillation; Risk                  Factors:Hypertension and Diabetes. PVD.  Sonographer:     Sherrie Sport RDCS (AE) Referring Phys:  [id number] Bradly Bienenstock Diagnosing Phys: Ida Rogue MD  Sonographer Comments: Technically difficult study due to poor echo windows, no apical window, no parasternal window and echo performed with patient supine and on artificial respirator. IMPRESSIONS  1. Left ventricular ejection fraction, by estimation, is 50 to 55%. The left ventricle has low normal function. Unable to exclude regional wall motion abnormality.There is mild left ventricular hypertrophy. Left ventricular diastolic parameters are indeterminate.  2. Right ventricular systolic function was not well visualized. The right ventricular size is  normal.  3. Challenging image quality FINDINGS  Left Ventricle: Left ventricular ejection fraction, by estimation, is 50 to 55%. The left ventricle has low normal function. The left ventricle has no regional wall motion abnormalities. The left ventricular internal cavity size was normal in size. There is mild left ventricular hypertrophy. Left ventricular diastolic parameters are indeterminate. Right Ventricle: The right ventricular size is normal. No increase in right ventricular wall thickness. Right ventricular systolic function was not well visualized. Left Atrium: Left atrial size was not well visualized. Right Atrium: Right atrial size was not well visualized. Pericardium: There is no evidence of pericardial effusion. Mitral Valve: The mitral valve was not well visualized. Normal mobility of the mitral valve leaflets. No evidence of mitral valve regurgitation. No evidence of mitral valve stenosis. Tricuspid Valve: The tricuspid valve is not well visualized. Tricuspid valve regurgitation is not demonstrated. No evidence of tricuspid stenosis. Aortic Valve: The aortic valve was not well visualized. Aortic valve regurgitation is not visualized. No aortic stenosis is present. Pulmonic Valve: The pulmonic valve was not well visualized. Pulmonic valve regurgitation is not visualized. No evidence of pulmonic stenosis. Aorta: The aortic root was not well visualized and the ascending aorta was not well visualized. Venous: The pulmonary veins were not well visualized. The inferior vena cava is normal in size with greater than 50% respiratory variability, suggesting right atrial pressure of 3 mmHg. IAS/Shunts: No atrial level shunt detected by color flow Doppler.  LEFT VENTRICLE PLAX 2D LVIDd:  4.95 cm LVIDs:         3.32 cm LV PW:         1.61 cm LV IVS:        1.49 cm  LEFT ATRIUM         Index LA diam:    4.80 cm 2.49 cm/m  PULMONIC VALVE PV Vmax:        0.86 m/s PV Peak grad:   3.0 mmHg RVOT Peak grad: 4 mmHg   Ida Rogue MD Electronically signed by Ida Rogue MD Signature Date/Time: 11/30/2019/6:57:51 PM    Final      STUDIES:  7/9: CXR>Mild cardiomegaly. Diffuse interstitial opacity. No pleural effusion or pneumothorax. No focal airspace consolidation.>  7/9: CT Head wo Contrast>>1. No acute intracranial abnormality. 2. Stable parenchymal volume loss and chronic microvascular ischemic changes. 3. Debris in the external auditory canals. 7/9: CT Cervical Spine>>1. No acute cervical spine fracture or traumatic listhesis. 2. Multilevel degenerative changes of the cervical spine, as described above. 7/9: CT Chest/Abdomen/Pelvis>>1. Unenhanced CT was performed per clinician order. Lack of IV contrast limits sensitivity and specificity, especially for evaluation of the vasculature and abdominal/pelvic solid viscera. 2. Posterior left third through sixth rib fractures. Posterior right rib 4 rib fractures. Can be seen in the setting of resuscitative efforts. No pneumothorax. 3. Heterogeneous largely dependent areas of mixed ground-glass and dense consolidation in both lungs with fluid-filled dependent airways as well as septal and fissural thickening, and small bilateral pleural effusions as well as features of volume loss. Such findings are worrisome for sequela of aspiration likely with superimposed pulmonary edema and volume loss. Mild contusive changes may also be present in the setting of resuscitation. 4. Endotracheal tube in the mid to lower trachea, 3 cm from the carina. 5. Transesophageal tube in appropriate position terminating in the gastric body with side port distal to the GE junction. 6. No other acute abdominopelvic abnormalities. 7. Nonobstructing right nephrolithiasis. 8. Hyperdense cystic structures arising from the lateral right interpolar kidney and upper pole left kidney., possible hyperdense exophytic cyst. Recommend outpatient renal ultrasound. 9. Aortic  Atherosclerosis  7/9: 2D Echocardiogram>>  CULTURES: SARS-CoV-2 PCR 7/9>>negative Blood 7/9>> Tracheal aspirate 7/9>>  ANTIBIOTICS: Unasyn 7/9>>  SIGNIFICANT EVENTS: 7/9: Presented to ED; required intubation due to aspiration 7/9: PCCM asked to admit to ICU  LINES/TUBES: ETT 7/9>>   ASSESSMENT / PLAN:   Hyperosmolar non-ketotic hyperglycemia - Endox2 protocol  - Diabetic coordinator - appreciate input  - judicious use of IVF due to advaced CHF with bilateral infiltrates on 80%fIO2 at this time  - electrolyte repletion - pharmD eval    Aspiration pneumonia  - solid debris aspirated from tracheal via ETT suspicious for food bolus  -continue IV Unasyn   - wean FiO2 as able  - recruitment maneuvers  -repeat CXR in 2d  - PRN albuterol   Generalized seizures  - neurology on case - appreciate input - reivewed with Dr Doy Mince - likely metabolic in etiology  - treat underlying cause - correct electrolytes, remedy AKI, treat HHS  - EEG scheduled   -c/w Keppra IV   Atrial fibrillation with rapid ventricular response  - we need to decrease volume infusion due to CHF  -will transition to xarelto via OGT  - currently rate controlled at 71bpm  -TTE in progress  - cardiology consultation - appreciate input  OSA  - currently on MV   Dehydration   - due to HHS   - corrected Na 156 -  will start free h2O via OGT  -continue IVF    Acute on crhonic renal failure - stage 2  -d/c non essential nephrotoxic medications  -judicious IVF    GI/DVT ppx   - famotidine   - heparin >>xarelto  Critical care provider statement:    Critical care time (minutes):  33   Critical care time was exclusive of:  Separately billable procedures and  treating other patients   Critical care was necessary to treat or prevent imminent or  life-threatening deterioration of the following conditions:  seizure activity, AKI/ckd, aspiration pneumonia, acute hypoxemia, chf, af, multiple comorbid  conditions.    Critical care was time spent personally by me on the following  activities:  Development of treatment plan with patient or surrogate,  discussions with consultants, evaluation of patient's response to  treatment, examination of patient, obtaining history from patient or  surrogate, ordering and performing treatments and interventions, ordering  and review of laboratory studies and re-evaluation of patient's condition   I assumed direction of critical care for this patient from another  provider in my specialty: no      Ottie Glazier, M.D.  Pulmonary & [place]

## 2019-12-01 NOTE — Progress Notes (Signed)
Patient intubated/sedated on propofol. Responds to pain. Propofol decreased this afternoon and patient with increased response.  Had to turn it back up due to coughing on ETT.  Son and wife in this afternoon.  Dr. Doy Mince spoke to son on phone and Dr. Lanney Gins also spoke with him.

## 2019-12-01 NOTE — Progress Notes (Signed)
[facility] Encounter Note  Patient: [name] / Admit Date: 11/30/2019 / Date of Encounter: 12/01/2019, 9:17 AM   Subjective: No apparent significant change in current condition of the patient. Patient still intubated and oxygenating well with respirator. No further seizure activity apparent. Troponin level 39 most consistent with demand ischemia rather than acute coronary syndrome. Patient has had no further rhythm disturbances after intravenous amiodarone has converted patient to normal sinus rhythm and we have changed to oral amiodarone.  Review of Systems: Cannot assess due to intubation  objective: Telemetry: Normal sinus rhythm Physical Exam: Blood pressure (!) 117/56, pulse (!) 54, temperature 99 F (37.2 C), resp. rate (!) 22, height '5\' 7"'  (1.702 m), weight 83 kg, SpO2 100 %. Body mass index is 28.66 kg/m. General: Well developed, well nourished, in no acute distress. Head: Normocephalic, atraumatic, sclera non-icteric, no xanthomas, nares are without discharge. Neck: No apparent masses Lungs: Intubated on respirator with no wheezes, no rhonchi, no rales , no crackles   Heart: Regular rate and rhythm, normal S1 S2, no murmur, no rub, no gallop, PMI is normal size and placement, carotid upstroke normal without bruit, jugular venous pressure normal Abdomen: Soft,  non-distended with normoactive bowel sounds. No hepatosplenomegaly. Abdominal aorta is normal size without bruit Extremities: Bilateral BKA  peripheral: 2+ radial, 2+ femoral,   Neuro: Is not alert and oriented. Does not moves all extremities spontaneously. Psych: Does not responds to questions appropriately with a normal affect.   Intake/Output Summary (Last 24 hours) at 12/01/2019 0917 Last data filed at 12/01/2019 0600 Gross per 24 hour  Intake 1246.57 ml  Output 455 ml  Net 791.57 ml    Inpatient Medications:  . chlorhexidine gluconate (MEDLINE KIT)  15 mL Mouth Rinse BID  .  Chlorhexidine Gluconate Cloth  6 each Topical Daily  . docusate  100 mg Oral BID  . free water  200 mL Per Tube Q4H  . insulin aspart  0-15 Units Subcutaneous Q4H  . insulin glargine  12 Units Subcutaneous QHS  . mouth rinse  15 mL Mouth Rinse 10 times per day  . polyethylene glycol  17 g Oral Daily  . rivaroxaban  15 mg Oral Q supper   Infusions:  . sodium chloride 75 mL/hr at 11/30/19 1119  . albumin human Stopped (11/30/19 1148)  . ampicillin-sulbactam (UNASYN) IV 3 g (12/01/19 0547)  . dextrose 5 % and 0.45% NaCl 75 mL/hr at 11/30/19 1800  . famotidine (PEPCID) IV 20 mg (11/30/19 2206)  . levETIRAcetam 500 mg (12/01/19 0859)  . propofol (DIPRIVAN) infusion 30 mcg/kg/min (11/30/19 2204)  . vasopressin      Labs: Recent Labs    11/30/19 0109 11/30/19 0533 11/30/19 1306 12/01/19 0422  NA 893   < > DUPLICATE REQUEST  734 287  K 4.0   < > DUPLICATE REQUEST  4.4 3.9  CL 99   < > DUPLICATE REQUEST  681 157  CO2 22   < > DUPLICATE REQUEST  25 25  GLUCOSE 262*   < > DUPLICATE REQUEST  035* 185*  BUN 28*   < > DUPLICATE REQUEST  32* 32*  CREATININE 5.97*   < > DUPLICATE REQUEST  4.16* 1.52*  CALCIUM 38.4*   < > DUPLICATE REQUEST  53.6 9.4  MG 2.0  --   --  1.9   < > = values in this interval not displayed.   Recent Labs    11/30/19 0109 12/01/19 0422  AST  43* 26  ALT 28 23  ALKPHOS 172* 81  BILITOT 1.3* 0.9  PROT 7.9 5.6*  ALBUMIN 3.5 2.5*   Recent Labs    11/30/19 0109 12/01/19 0612  WBC 21.8* 17.2*  NEUTROABS 15.1* 14.1*  HGB 17.6* 12.7*  HCT 52.3* 36.9*  MCV 86.9 85.8  PLT 241 204   No results for input(s): CKTOTAL, CKMB, TROPONINI in the last 72 hours. Invalid input(s): POCBNP Recent Labs    11/30/19 0841  HGBA1C 11.8*     Weights: Filed Weights   11/30/19 0117 11/30/19 0444 12/01/19 0500  Weight: 80.7 kg 81.4 kg 83 kg     Radiology/Studies:  EEG  Result Date: 11/30/2019 Alexis Goodell, MD     11/30/2019  4:20 PM ELECTROENCEPHALOGRAM  REPORT Patient: [name]       Room #: IC06A-AA EEG No. ID: [id number] Age: 85 y.o.        Sex: male Requesting Physician: Lanney Gins Report Date:  11/30/2019       Interpreting Physician: Alexis Goodell History: [name] is an 85 y.o. male s/p arrest and seizure Medications: Keppra, Unasyn, Insulin, Propofol, Vasopressin Conditions of Recording:  This is a 21 channel routine scalp EEG performed with bipolar and monopolar montages arranged in accordance to the international 10/20 system of electrode placement. One channel was dedicated to EKG recording. The patient is in the intubated and sedated state. Description:  The background activity is discontinuous. It consists of low voltage bursts of polyspike, spike and slow wave activity alternating with periods of attenuation. The burst activity lasts up to 4 seconds.  The periods of attenuation last up to 6 seconds. This discontinuous activity is maintained throughout the tracing. No activation procedures were performed.  Hyperventilation and intermittent photic stimulation were not performed. IMPRESSION: This is an abnormal EEG due to a burst-suppression pattern seen throughout the tracing.  Burst suppression pattern can be seen in a variety of circumstances, including anesthesia, drug intoxication, hypothermia, as well as cerebral anoxia.  Clinical/neurological, and radiographic correlation advised.  Alexis Goodell, MD Neurology [phone] 11/30/2019, 4:16 PM   CT ABDOMEN PELVIS WO CONTRAST  Result Date: 11/30/2019 CLINICAL DATA:  Found down with seizure-like activity, V-tach, seizure while in ED EXAM: CT HEAD WITHOUT CONTRAST CT CERVICAL SPINE WITHOUT CONTRAST CT CHEST, ABDOMEN AND PELVIS WITHOUT CONTRAST TECHNIQUE: Contiguous axial images were obtained from the base of the skull through the vertex without intravenous contrast. Multidetector CT imaging of the cervical spine was performed without intravenous contrast. Multiplanar CT image  reconstructions were also generated. Multidetector CT imaging of the chest, abdomen and pelvis was performed following the standard protocol without IV contrast. COMPARISON:  CT head 09/16/2019, MR 06/05/2018, same day chest radiograph, CT abdomen pelvis 09/17/2006 FINDINGS: CT HEAD FINDINGS Brain: No evidence of acute infarction, hemorrhage, hydrocephalus, extra-axial collection or mass lesion/mass effect. Symmetric prominence of the ventricles, cisterns and sulci compatible with parenchymal volume loss. Patchy areas of white matter hypoattenuation are most compatible with chronic microvascular angiopathy. Vascular: Atherosclerotic calcification of the carotid siphons and intradural vertebral arteries. No hyperdense vessel. Skull: No acute scalp swelling or hematoma. No calvarial fracture or other acute or suspicious osseous lesion. Chronic left occipital scalp infiltration, likely scarring. Small right frontal scalp lipoma, stable. Sinuses/Orbits: Paranasal sinuses and mastoid air cells are predominantly clear. Pneumatization of the petrous apices. Orbital structures are unremarkable aside from prior lens extractions. Other: Extensive debris in the external auditory canals. Mild bilateral TMJ arthrosis. Largely edentulous with mild mandibular  prognathism. CT CERVICAL FINDINGS Alignment: Stabilization collar is absent. Mild straightening of the upper cervical lordosis with reversal at the C4-5 levels. Mild retrolisthesis C4 on 5 and C5 on 6 is likely degenerative in nature. No evidence of traumatic listhesis. No abnormally widened, perched or jumped facets. Normal alignment of the craniocervical and atlantoaxial articulations accounting for leftward cranial rotation. Skull base and vertebrae: No acute skull base fracture. No vertebral fracture or height loss. Arthrosis present at the atlantodental interval with some mild calcific pannus formation. Multilevel spondylitic changes are detailed below. Soft tissues and  spinal canal: No pre or paravertebral fluid or swelling. No visible canal hematoma. Enthesopathic changes in the nuchal ligament. Disc levels: Multilevel intervertebral disc height loss with spondylitic endplate changes. Features maximal C4-5 and C5-6 with small posterior disc osteophyte complexes resulting in at most mild canal stenosis. Uncinate spurring facet hypertrophic changes are maximal at these levels as well with moderate to severe stenosis on the left at C5-6 and more moderate changes on the right at C4-5. Additional multilevel foraminal narrowing is mild. Other: Secretions noted in the posterior naso and oropharynx above the level of the inflated endotracheal balloon. Extensive cervical carotid atherosclerosis. No concerning thyroid nodules. CT CHEST FINDINGS Cardiovascular: Limited assessment of the vasculature in the absence of contrast media. Cardiac size within normal limits. Small pericardial effusion. Extensive three-vessel coronary artery disease. Extensive calcifications upon the aortic valve leaflets. Atherosclerotic plaque within the normal caliber aorta. Shared origin of brachiocephalic and left common carotid artery origins. Calcific plaque throughout the proximal great vessels. Central pulmonary arteries are borderline enlarged. Luminal evaluation precluded in the absence of contrast media. Mediastinum/Nodes: No mediastinal fluid or gas. Normal thyroid gland and thoracic inlet. No worrisome mediastinal or axillary adenopathy. Hilar nodal evaluation is limited in the absence of intravenous contrast media. Endotracheal tube terminates in the mid to lower trachea, 3 cm from the carina. Transesophageal tube tip terminates in the gastric body with side port distal to the GE junction. Lungs/Pleura: Heterogeneous dependent areas of mixed ground-glass and dense consolidative opacities are present in both lungs with multiple fluid-filled dependent airways as well as interlobular septal thickening and  fissural thickening and small bilateral pleural effusions with further atelectatic changes and volume loss as well. No pneumothorax. Musculoskeletal: Posterior left third through sixth left rib fractures. Posterior right rib 4 rib fractures. No other acute osseous injuries in the chest wall. Asymmetric sternoclavicular arthrosis, increased on the right. Degenerative changes in both shoulders. Multilevel degenerative changes present spine Multilevel flowing anterior osteophytosis, compatible with features of diffuse idiopathic skeletal hyperostosis (DISH). Mild body wall edema. CT ABDOMEN AND PELVIS FINDINGS Hepatobiliary: No visible hepatic injury or concerning liver lesion on this unenhanced CT. No perihepatic hematoma. Smooth surface contour. Normal attenuation. Patient is post cholecystectomy. Slight prominence of the biliary tree likely related to post cholecystectomy reservoir effect without visible calcified intraductal gallstones. Pancreas: Moderate pancreatic atrophy and partial fatty replacement of the pancreas. No ductal dilatation, discernible lesions, or inflammation. Spleen: No direct splenic injury. No perisplenic hematoma. Normal splenic size. No concerning lesions. Few scattered vascular calcifications. Adrenals/Urinary Tract: Mild lobular thickening of the adrenal gland could reflect some senescent hyperplasia without concerning nodules. No evidence of adrenal hemorrhage. No direct renal injury or perinephric hematoma. Bilateral renal atrophy with mild symmetric perinephric stranding, possibly related to senescent change or diminished renal function. Few vascular calcifications are present in both kidneys. Collecting system calculus seen in the upper pole right kidney. No obstructive urolithiasis is evident.  Hyperdense 18 mm cystic structure arising from the posterior interpolar right kidney (3/72) additional subcentimeter hyperdense cystic structure arising from the upper pole left kidney (3/65).  No other concerning renal lesions. Urinary bladder is largely decompressed by Foley catheter at the time of exam and therefore poorly evaluated by CT imaging. Further obscuration by streak artifact from a hip prosthesis. Stomach/Bowel: Transesophageal tube in appropriate position terminating in the gastric body with side port distal to the GE junction as detailed above. Mild tenting of the anterior stomach is a typical finding for placement of a transesophageal tube. Small duodenal lipoma. Small distal duodenal diverticulum (3/69). No small bowel thickening or dilatation. Mild fecalization of the distal small bowel contents can reflect some slowed intestinal transit Appendix is not visualized. No focal inflammation the vicinity of the cecum to suggest an occult appendicitis. No colonic dilatation or wall thickening. Vascular/Lymphatic: Extensive atherosclerotic calcification throughout the abdominal aorta and branch vessels. Luminal evaluation precluded in the absence of contrast media. No aneurysm or ectasia. No periaortic stranding or hemorrhage. No suspicious or enlarged lymph nodes in the included lymphatic chains. Reproductive: In prostate traversed by the Foley catheter. Coarse eccentric calcification of the prostate. No concerning abnormalities of the prostate or seminal vesicles. Nonspecific penile calcifications are noted. Other: No abdominopelvic free air or fluid. No bowel containing hernia. Small fat containing umbilical hernia. No traumatic abdominal wall dehiscence. No body wall or retroperitoneal hematoma. Musculoskeletal: The osseous structures appear diffusely demineralized which may limit detection of small or nondisplaced fractures. No acute osseous abnormality or suspicious osseous lesion. Extensive streak artifact related to patient's right hip arthroplasty. Multilevel degenerative changes are present in the imaged portions of the spine. Additional degenerative changes in the hips and pelvis. No  acute osseous abnormality of the included upper extremities. IMPRESSION: CT HEAD: 1. No acute intracranial abnormality. 2. Stable parenchymal volume loss and chronic microvascular ischemic changes. 3. Debris in the external auditory canals. CT CERVICAL SPINE: 1. No acute cervical spine fracture or traumatic listhesis. 2. Multilevel degenerative changes of the cervical spine, as described above. CT CHEST, ABDOMEN AND PELVIS 1. Unenhanced CT was performed per clinician order. Lack of IV contrast limits sensitivity and specificity, especially for evaluation of the vasculature and abdominal/pelvic solid viscera. 2. Posterior left third through sixth rib fractures. Posterior right rib 4 rib fractures. Can be seen in the setting of resuscitative efforts. No pneumothorax. 3. Heterogeneous largely dependent areas of mixed ground-glass and dense consolidation in both lungs with fluid-filled dependent airways as well as septal and fissural thickening, and small bilateral pleural effusions as well as features of volume loss. Such findings are worrisome for sequela of aspiration likely with superimposed pulmonary edema and volume loss. Mild contusive changes may also be present in the setting of resuscitation. 4. Endotracheal tube in the mid to lower trachea, 3 cm from the carina. 5. Transesophageal tube in appropriate position terminating in the gastric body with side port distal to the GE junction. 6. No other acute abdominopelvic abnormalities. 7. Nonobstructing right nephrolithiasis. 8. Hyperdense cystic structures arising from the lateral right interpolar kidney and upper pole left kidney., possible hyperdense exophytic cyst. Recommend outpatient renal ultrasound. 9. Aortic Atherosclerosis (ICD10-I70.0). Electronically Signed   By: Lovena Le M.D.   On: 11/30/2019 02:57   CT Head Wo Contrast  Result Date: 11/30/2019 CLINICAL DATA:  Found down with seizure-like activity, V-tach, seizure while in ED EXAM: CT HEAD  WITHOUT CONTRAST CT CERVICAL SPINE WITHOUT CONTRAST CT CHEST, ABDOMEN AND  PELVIS WITHOUT CONTRAST TECHNIQUE: Contiguous axial images were obtained from the base of the skull through the vertex without intravenous contrast. Multidetector CT imaging of the cervical spine was performed without intravenous contrast. Multiplanar CT image reconstructions were also generated. Multidetector CT imaging of the chest, abdomen and pelvis was performed following the standard protocol without IV contrast. COMPARISON:  CT head 09/16/2019, MR 06/05/2018, same day chest radiograph, CT abdomen pelvis 09/17/2006 FINDINGS: CT HEAD FINDINGS Brain: No evidence of acute infarction, hemorrhage, hydrocephalus, extra-axial collection or mass lesion/mass effect. Symmetric prominence of the ventricles, cisterns and sulci compatible with parenchymal volume loss. Patchy areas of white matter hypoattenuation are most compatible with chronic microvascular angiopathy. Vascular: Atherosclerotic calcification of the carotid siphons and intradural vertebral arteries. No hyperdense vessel. Skull: No acute scalp swelling or hematoma. No calvarial fracture or other acute or suspicious osseous lesion. Chronic left occipital scalp infiltration, likely scarring. Small right frontal scalp lipoma, stable. Sinuses/Orbits: Paranasal sinuses and mastoid air cells are predominantly clear. Pneumatization of the petrous apices. Orbital structures are unremarkable aside from prior lens extractions. Other: Extensive debris in the external auditory canals. Mild bilateral TMJ arthrosis. Largely edentulous with mild mandibular prognathism. CT CERVICAL FINDINGS Alignment: Stabilization collar is absent. Mild straightening of the upper cervical lordosis with reversal at the C4-5 levels. Mild retrolisthesis C4 on 5 and C5 on 6 is likely degenerative in nature. No evidence of traumatic listhesis. No abnormally widened, perched or jumped facets. Normal alignment of the  craniocervical and atlantoaxial articulations accounting for leftward cranial rotation. Skull base and vertebrae: No acute skull base fracture. No vertebral fracture or height loss. Arthrosis present at the atlantodental interval with some mild calcific pannus formation. Multilevel spondylitic changes are detailed below. Soft tissues and spinal canal: No pre or paravertebral fluid or swelling. No visible canal hematoma. Enthesopathic changes in the nuchal ligament. Disc levels: Multilevel intervertebral disc height loss with spondylitic endplate changes. Features maximal C4-5 and C5-6 with small posterior disc osteophyte complexes resulting in at most mild canal stenosis. Uncinate spurring facet hypertrophic changes are maximal at these levels as well with moderate to severe stenosis on the left at C5-6 and more moderate changes on the right at C4-5. Additional multilevel foraminal narrowing is mild. Other: Secretions noted in the posterior naso and oropharynx above the level of the inflated endotracheal balloon. Extensive cervical carotid atherosclerosis. No concerning thyroid nodules. CT CHEST FINDINGS Cardiovascular: Limited assessment of the vasculature in the absence of contrast media. Cardiac size within normal limits. Small pericardial effusion. Extensive three-vessel coronary artery disease. Extensive calcifications upon the aortic valve leaflets. Atherosclerotic plaque within the normal caliber aorta. Shared origin of brachiocephalic and left common carotid artery origins. Calcific plaque throughout the proximal great vessels. Central pulmonary arteries are borderline enlarged. Luminal evaluation precluded in the absence of contrast media. Mediastinum/Nodes: No mediastinal fluid or gas. Normal thyroid gland and thoracic inlet. No worrisome mediastinal or axillary adenopathy. Hilar nodal evaluation is limited in the absence of intravenous contrast media. Endotracheal tube terminates in the mid to lower  trachea, 3 cm from the carina. Transesophageal tube tip terminates in the gastric body with side port distal to the GE junction. Lungs/Pleura: Heterogeneous dependent areas of mixed ground-glass and dense consolidative opacities are present in both lungs with multiple fluid-filled dependent airways as well as interlobular septal thickening and fissural thickening and small bilateral pleural effusions with further atelectatic changes and volume loss as well. No pneumothorax. Musculoskeletal: Posterior left third through sixth left rib fractures.  Posterior right rib 4 rib fractures. No other acute osseous injuries in the chest wall. Asymmetric sternoclavicular arthrosis, increased on the right. Degenerative changes in both shoulders. Multilevel degenerative changes present spine Multilevel flowing anterior osteophytosis, compatible with features of diffuse idiopathic skeletal hyperostosis (DISH). Mild body wall edema. CT ABDOMEN AND PELVIS FINDINGS Hepatobiliary: No visible hepatic injury or concerning liver lesion on this unenhanced CT. No perihepatic hematoma. Smooth surface contour. Normal attenuation. Patient is post cholecystectomy. Slight prominence of the biliary tree likely related to post cholecystectomy reservoir effect without visible calcified intraductal gallstones. Pancreas: Moderate pancreatic atrophy and partial fatty replacement of the pancreas. No ductal dilatation, discernible lesions, or inflammation. Spleen: No direct splenic injury. No perisplenic hematoma. Normal splenic size. No concerning lesions. Few scattered vascular calcifications. Adrenals/Urinary Tract: Mild lobular thickening of the adrenal gland could reflect some senescent hyperplasia without concerning nodules. No evidence of adrenal hemorrhage. No direct renal injury or perinephric hematoma. Bilateral renal atrophy with mild symmetric perinephric stranding, possibly related to senescent change or diminished renal function. Few  vascular calcifications are present in both kidneys. Collecting system calculus seen in the upper pole right kidney. No obstructive urolithiasis is evident. Hyperdense 18 mm cystic structure arising from the posterior interpolar right kidney (3/72) additional subcentimeter hyperdense cystic structure arising from the upper pole left kidney (3/65). No other concerning renal lesions. Urinary bladder is largely decompressed by Foley catheter at the time of exam and therefore poorly evaluated by CT imaging. Further obscuration by streak artifact from a hip prosthesis. Stomach/Bowel: Transesophageal tube in appropriate position terminating in the gastric body with side port distal to the GE junction as detailed above. Mild tenting of the anterior stomach is a typical finding for placement of a transesophageal tube. Small duodenal lipoma. Small distal duodenal diverticulum (3/69). No small bowel thickening or dilatation. Mild fecalization of the distal small bowel contents can reflect some slowed intestinal transit Appendix is not visualized. No focal inflammation the vicinity of the cecum to suggest an occult appendicitis. No colonic dilatation or wall thickening. Vascular/Lymphatic: Extensive atherosclerotic calcification throughout the abdominal aorta and branch vessels. Luminal evaluation precluded in the absence of contrast media. No aneurysm or ectasia. No periaortic stranding or hemorrhage. No suspicious or enlarged lymph nodes in the included lymphatic chains. Reproductive: In prostate traversed by the Foley catheter. Coarse eccentric calcification of the prostate. No concerning abnormalities of the prostate or seminal vesicles. Nonspecific penile calcifications are noted. Other: No abdominopelvic free air or fluid. No bowel containing hernia. Small fat containing umbilical hernia. No traumatic abdominal wall dehiscence. No body wall or retroperitoneal hematoma. Musculoskeletal: The osseous structures appear  diffusely demineralized which may limit detection of small or nondisplaced fractures. No acute osseous abnormality or suspicious osseous lesion. Extensive streak artifact related to patient's right hip arthroplasty. Multilevel degenerative changes are present in the imaged portions of the spine. Additional degenerative changes in the hips and pelvis. No acute osseous abnormality of the included upper extremities. IMPRESSION: CT HEAD: 1. No acute intracranial abnormality. 2. Stable parenchymal volume loss and chronic microvascular ischemic changes. 3. Debris in the external auditory canals. CT CERVICAL SPINE: 1. No acute cervical spine fracture or traumatic listhesis. 2. Multilevel degenerative changes of the cervical spine, as described above. CT CHEST, ABDOMEN AND PELVIS 1. Unenhanced CT was performed per clinician order. Lack of IV contrast limits sensitivity and specificity, especially for evaluation of the vasculature and abdominal/pelvic solid viscera. 2. Posterior left third through sixth rib fractures. Posterior right rib  4 rib fractures. Can be seen in the setting of resuscitative efforts. No pneumothorax. 3. Heterogeneous largely dependent areas of mixed ground-glass and dense consolidation in both lungs with fluid-filled dependent airways as well as septal and fissural thickening, and small bilateral pleural effusions as well as features of volume loss. Such findings are worrisome for sequela of aspiration likely with superimposed pulmonary edema and volume loss. Mild contusive changes may also be present in the setting of resuscitation. 4. Endotracheal tube in the mid to lower trachea, 3 cm from the carina. 5. Transesophageal tube in appropriate position terminating in the gastric body with side port distal to the GE junction. 6. No other acute abdominopelvic abnormalities. 7. Nonobstructing right nephrolithiasis. 8. Hyperdense cystic structures arising from the lateral right interpolar kidney and upper  pole left kidney., possible hyperdense exophytic cyst. Recommend outpatient renal ultrasound. 9. Aortic Atherosclerosis (ICD10-I70.0). Electronically Signed   By: Lovena Le M.D.   On: 11/30/2019 02:57   CT Chest Wo Contrast  Result Date: 11/30/2019 CLINICAL DATA:  Found down with seizure-like activity, V-tach, seizure while in ED EXAM: CT HEAD WITHOUT CONTRAST CT CERVICAL SPINE WITHOUT CONTRAST CT CHEST, ABDOMEN AND PELVIS WITHOUT CONTRAST TECHNIQUE: Contiguous axial images were obtained from the base of the skull through the vertex without intravenous contrast. Multidetector CT imaging of the cervical spine was performed without intravenous contrast. Multiplanar CT image reconstructions were also generated. Multidetector CT imaging of the chest, abdomen and pelvis was performed following the standard protocol without IV contrast. COMPARISON:  CT head 09/16/2019, MR 06/05/2018, same day chest radiograph, CT abdomen pelvis 09/17/2006 FINDINGS: CT HEAD FINDINGS Brain: No evidence of acute infarction, hemorrhage, hydrocephalus, extra-axial collection or mass lesion/mass effect. Symmetric prominence of the ventricles, cisterns and sulci compatible with parenchymal volume loss. Patchy areas of white matter hypoattenuation are most compatible with chronic microvascular angiopathy. Vascular: Atherosclerotic calcification of the carotid siphons and intradural vertebral arteries. No hyperdense vessel. Skull: No acute scalp swelling or hematoma. No calvarial fracture or other acute or suspicious osseous lesion. Chronic left occipital scalp infiltration, likely scarring. Small right frontal scalp lipoma, stable. Sinuses/Orbits: Paranasal sinuses and mastoid air cells are predominantly clear. Pneumatization of the petrous apices. Orbital structures are unremarkable aside from prior lens extractions. Other: Extensive debris in the external auditory canals. Mild bilateral TMJ arthrosis. Largely edentulous with mild  mandibular prognathism. CT CERVICAL FINDINGS Alignment: Stabilization collar is absent. Mild straightening of the upper cervical lordosis with reversal at the C4-5 levels. Mild retrolisthesis C4 on 5 and C5 on 6 is likely degenerative in nature. No evidence of traumatic listhesis. No abnormally widened, perched or jumped facets. Normal alignment of the craniocervical and atlantoaxial articulations accounting for leftward cranial rotation. Skull base and vertebrae: No acute skull base fracture. No vertebral fracture or height loss. Arthrosis present at the atlantodental interval with some mild calcific pannus formation. Multilevel spondylitic changes are detailed below. Soft tissues and spinal canal: No pre or paravertebral fluid or swelling. No visible canal hematoma. Enthesopathic changes in the nuchal ligament. Disc levels: Multilevel intervertebral disc height loss with spondylitic endplate changes. Features maximal C4-5 and C5-6 with small posterior disc osteophyte complexes resulting in at most mild canal stenosis. Uncinate spurring facet hypertrophic changes are maximal at these levels as well with moderate to severe stenosis on the left at C5-6 and more moderate changes on the right at C4-5. Additional multilevel foraminal narrowing is mild. Other: Secretions noted in the posterior naso and oropharynx above the level of  the inflated endotracheal balloon. Extensive cervical carotid atherosclerosis. No concerning thyroid nodules. CT CHEST FINDINGS Cardiovascular: Limited assessment of the vasculature in the absence of contrast media. Cardiac size within normal limits. Small pericardial effusion. Extensive three-vessel coronary artery disease. Extensive calcifications upon the aortic valve leaflets. Atherosclerotic plaque within the normal caliber aorta. Shared origin of brachiocephalic and left common carotid artery origins. Calcific plaque throughout the proximal great vessels. Central pulmonary arteries are  borderline enlarged. Luminal evaluation precluded in the absence of contrast media. Mediastinum/Nodes: No mediastinal fluid or gas. Normal thyroid gland and thoracic inlet. No worrisome mediastinal or axillary adenopathy. Hilar nodal evaluation is limited in the absence of intravenous contrast media. Endotracheal tube terminates in the mid to lower trachea, 3 cm from the carina. Transesophageal tube tip terminates in the gastric body with side port distal to the GE junction. Lungs/Pleura: Heterogeneous dependent areas of mixed ground-glass and dense consolidative opacities are present in both lungs with multiple fluid-filled dependent airways as well as interlobular septal thickening and fissural thickening and small bilateral pleural effusions with further atelectatic changes and volume loss as well. No pneumothorax. Musculoskeletal: Posterior left third through sixth left rib fractures. Posterior right rib 4 rib fractures. No other acute osseous injuries in the chest wall. Asymmetric sternoclavicular arthrosis, increased on the right. Degenerative changes in both shoulders. Multilevel degenerative changes present spine Multilevel flowing anterior osteophytosis, compatible with features of diffuse idiopathic skeletal hyperostosis (DISH). Mild body wall edema. CT ABDOMEN AND PELVIS FINDINGS Hepatobiliary: No visible hepatic injury or concerning liver lesion on this unenhanced CT. No perihepatic hematoma. Smooth surface contour. Normal attenuation. Patient is post cholecystectomy. Slight prominence of the biliary tree likely related to post cholecystectomy reservoir effect without visible calcified intraductal gallstones. Pancreas: Moderate pancreatic atrophy and partial fatty replacement of the pancreas. No ductal dilatation, discernible lesions, or inflammation. Spleen: No direct splenic injury. No perisplenic hematoma. Normal splenic size. No concerning lesions. Few scattered vascular calcifications.  Adrenals/Urinary Tract: Mild lobular thickening of the adrenal gland could reflect some senescent hyperplasia without concerning nodules. No evidence of adrenal hemorrhage. No direct renal injury or perinephric hematoma. Bilateral renal atrophy with mild symmetric perinephric stranding, possibly related to senescent change or diminished renal function. Few vascular calcifications are present in both kidneys. Collecting system calculus seen in the upper pole right kidney. No obstructive urolithiasis is evident. Hyperdense 18 mm cystic structure arising from the posterior interpolar right kidney (3/72) additional subcentimeter hyperdense cystic structure arising from the upper pole left kidney (3/65). No other concerning renal lesions. Urinary bladder is largely decompressed by Foley catheter at the time of exam and therefore poorly evaluated by CT imaging. Further obscuration by streak artifact from a hip prosthesis. Stomach/Bowel: Transesophageal tube in appropriate position terminating in the gastric body with side port distal to the GE junction as detailed above. Mild tenting of the anterior stomach is a typical finding for placement of a transesophageal tube. Small duodenal lipoma. Small distal duodenal diverticulum (3/69). No small bowel thickening or dilatation. Mild fecalization of the distal small bowel contents can reflect some slowed intestinal transit Appendix is not visualized. No focal inflammation the vicinity of the cecum to suggest an occult appendicitis. No colonic dilatation or wall thickening. Vascular/Lymphatic: Extensive atherosclerotic calcification throughout the abdominal aorta and branch vessels. Luminal evaluation precluded in the absence of contrast media. No aneurysm or ectasia. No periaortic stranding or hemorrhage. No suspicious or enlarged lymph nodes in the included lymphatic chains. Reproductive: In prostate traversed by the  Foley catheter. Coarse eccentric calcification of the  prostate. No concerning abnormalities of the prostate or seminal vesicles. Nonspecific penile calcifications are noted. Other: No abdominopelvic free air or fluid. No bowel containing hernia. Small fat containing umbilical hernia. No traumatic abdominal wall dehiscence. No body wall or retroperitoneal hematoma. Musculoskeletal: The osseous structures appear diffusely demineralized which may limit detection of small or nondisplaced fractures. No acute osseous abnormality or suspicious osseous lesion. Extensive streak artifact related to patient's right hip arthroplasty. Multilevel degenerative changes are present in the imaged portions of the spine. Additional degenerative changes in the hips and pelvis. No acute osseous abnormality of the included upper extremities. IMPRESSION: CT HEAD: 1. No acute intracranial abnormality. 2. Stable parenchymal volume loss and chronic microvascular ischemic changes. 3. Debris in the external auditory canals. CT CERVICAL SPINE: 1. No acute cervical spine fracture or traumatic listhesis. 2. Multilevel degenerative changes of the cervical spine, as described above. CT CHEST, ABDOMEN AND PELVIS 1. Unenhanced CT was performed per clinician order. Lack of IV contrast limits sensitivity and specificity, especially for evaluation of the vasculature and abdominal/pelvic solid viscera. 2. Posterior left third through sixth rib fractures. Posterior right rib 4 rib fractures. Can be seen in the setting of resuscitative efforts. No pneumothorax. 3. Heterogeneous largely dependent areas of mixed ground-glass and dense consolidation in both lungs with fluid-filled dependent airways as well as septal and fissural thickening, and small bilateral pleural effusions as well as features of volume loss. Such findings are worrisome for sequela of aspiration likely with superimposed pulmonary edema and volume loss. Mild contusive changes may also be present in the setting of resuscitation. 4. Endotracheal  tube in the mid to lower trachea, 3 cm from the carina. 5. Transesophageal tube in appropriate position terminating in the gastric body with side port distal to the GE junction. 6. No other acute abdominopelvic abnormalities. 7. Nonobstructing right nephrolithiasis. 8. Hyperdense cystic structures arising from the lateral right interpolar kidney and upper pole left kidney., possible hyperdense exophytic cyst. Recommend outpatient renal ultrasound. 9. Aortic Atherosclerosis (ICD10-I70.0). Electronically Signed   By: Lovena Le M.D.   On: 11/30/2019 02:57   CT Cervical Spine Wo Contrast  Result Date: 11/30/2019 CLINICAL DATA:  Found down with seizure-like activity, V-tach, seizure while in ED EXAM: CT HEAD WITHOUT CONTRAST CT CERVICAL SPINE WITHOUT CONTRAST CT CHEST, ABDOMEN AND PELVIS WITHOUT CONTRAST TECHNIQUE: Contiguous axial images were obtained from the base of the skull through the vertex without intravenous contrast. Multidetector CT imaging of the cervical spine was performed without intravenous contrast. Multiplanar CT image reconstructions were also generated. Multidetector CT imaging of the chest, abdomen and pelvis was performed following the standard protocol without IV contrast. COMPARISON:  CT head 09/16/2019, MR 06/05/2018, same day chest radiograph, CT abdomen pelvis 09/17/2006 FINDINGS: CT HEAD FINDINGS Brain: No evidence of acute infarction, hemorrhage, hydrocephalus, extra-axial collection or mass lesion/mass effect. Symmetric prominence of the ventricles, cisterns and sulci compatible with parenchymal volume loss. Patchy areas of white matter hypoattenuation are most compatible with chronic microvascular angiopathy. Vascular: Atherosclerotic calcification of the carotid siphons and intradural vertebral arteries. No hyperdense vessel. Skull: No acute scalp swelling or hematoma. No calvarial fracture or other acute or suspicious osseous lesion. Chronic left occipital scalp infiltration, likely  scarring. Small right frontal scalp lipoma, stable. Sinuses/Orbits: Paranasal sinuses and mastoid air cells are predominantly clear. Pneumatization of the petrous apices. Orbital structures are unremarkable aside from prior lens extractions. Other: Extensive debris in the external auditory canals. Mild  bilateral TMJ arthrosis. Largely edentulous with mild mandibular prognathism. CT CERVICAL FINDINGS Alignment: Stabilization collar is absent. Mild straightening of the upper cervical lordosis with reversal at the C4-5 levels. Mild retrolisthesis C4 on 5 and C5 on 6 is likely degenerative in nature. No evidence of traumatic listhesis. No abnormally widened, perched or jumped facets. Normal alignment of the craniocervical and atlantoaxial articulations accounting for leftward cranial rotation. Skull base and vertebrae: No acute skull base fracture. No vertebral fracture or height loss. Arthrosis present at the atlantodental interval with some mild calcific pannus formation. Multilevel spondylitic changes are detailed below. Soft tissues and spinal canal: No pre or paravertebral fluid or swelling. No visible canal hematoma. Enthesopathic changes in the nuchal ligament. Disc levels: Multilevel intervertebral disc height loss with spondylitic endplate changes. Features maximal C4-5 and C5-6 with small posterior disc osteophyte complexes resulting in at most mild canal stenosis. Uncinate spurring facet hypertrophic changes are maximal at these levels as well with moderate to severe stenosis on the left at C5-6 and more moderate changes on the right at C4-5. Additional multilevel foraminal narrowing is mild. Other: Secretions noted in the posterior naso and oropharynx above the level of the inflated endotracheal balloon. Extensive cervical carotid atherosclerosis. No concerning thyroid nodules. CT CHEST FINDINGS Cardiovascular: Limited assessment of the vasculature in the absence of contrast media. Cardiac size within normal  limits. Small pericardial effusion. Extensive three-vessel coronary artery disease. Extensive calcifications upon the aortic valve leaflets. Atherosclerotic plaque within the normal caliber aorta. Shared origin of brachiocephalic and left common carotid artery origins. Calcific plaque throughout the proximal great vessels. Central pulmonary arteries are borderline enlarged. Luminal evaluation precluded in the absence of contrast media. Mediastinum/Nodes: No mediastinal fluid or gas. Normal thyroid gland and thoracic inlet. No worrisome mediastinal or axillary adenopathy. Hilar nodal evaluation is limited in the absence of intravenous contrast media. Endotracheal tube terminates in the mid to lower trachea, 3 cm from the carina. Transesophageal tube tip terminates in the gastric body with side port distal to the GE junction. Lungs/Pleura: Heterogeneous dependent areas of mixed ground-glass and dense consolidative opacities are present in both lungs with multiple fluid-filled dependent airways as well as interlobular septal thickening and fissural thickening and small bilateral pleural effusions with further atelectatic changes and volume loss as well. No pneumothorax. Musculoskeletal: Posterior left third through sixth left rib fractures. Posterior right rib 4 rib fractures. No other acute osseous injuries in the chest wall. Asymmetric sternoclavicular arthrosis, increased on the right. Degenerative changes in both shoulders. Multilevel degenerative changes present spine Multilevel flowing anterior osteophytosis, compatible with features of diffuse idiopathic skeletal hyperostosis (DISH). Mild body wall edema. CT ABDOMEN AND PELVIS FINDINGS Hepatobiliary: No visible hepatic injury or concerning liver lesion on this unenhanced CT. No perihepatic hematoma. Smooth surface contour. Normal attenuation. Patient is post cholecystectomy. Slight prominence of the biliary tree likely related to post cholecystectomy reservoir  effect without visible calcified intraductal gallstones. Pancreas: Moderate pancreatic atrophy and partial fatty replacement of the pancreas. No ductal dilatation, discernible lesions, or inflammation. Spleen: No direct splenic injury. No perisplenic hematoma. Normal splenic size. No concerning lesions. Few scattered vascular calcifications. Adrenals/Urinary Tract: Mild lobular thickening of the adrenal gland could reflect some senescent hyperplasia without concerning nodules. No evidence of adrenal hemorrhage. No direct renal injury or perinephric hematoma. Bilateral renal atrophy with mild symmetric perinephric stranding, possibly related to senescent change or diminished renal function. Few vascular calcifications are present in both kidneys. Collecting system calculus seen in the upper  pole right kidney. No obstructive urolithiasis is evident. Hyperdense 18 mm cystic structure arising from the posterior interpolar right kidney (3/72) additional subcentimeter hyperdense cystic structure arising from the upper pole left kidney (3/65). No other concerning renal lesions. Urinary bladder is largely decompressed by Foley catheter at the time of exam and therefore poorly evaluated by CT imaging. Further obscuration by streak artifact from a hip prosthesis. Stomach/Bowel: Transesophageal tube in appropriate position terminating in the gastric body with side port distal to the GE junction as detailed above. Mild tenting of the anterior stomach is a typical finding for placement of a transesophageal tube. Small duodenal lipoma. Small distal duodenal diverticulum (3/69). No small bowel thickening or dilatation. Mild fecalization of the distal small bowel contents can reflect some slowed intestinal transit Appendix is not visualized. No focal inflammation the vicinity of the cecum to suggest an occult appendicitis. No colonic dilatation or wall thickening. Vascular/Lymphatic: Extensive atherosclerotic calcification  throughout the abdominal aorta and branch vessels. Luminal evaluation precluded in the absence of contrast media. No aneurysm or ectasia. No periaortic stranding or hemorrhage. No suspicious or enlarged lymph nodes in the included lymphatic chains. Reproductive: In prostate traversed by the Foley catheter. Coarse eccentric calcification of the prostate. No concerning abnormalities of the prostate or seminal vesicles. Nonspecific penile calcifications are noted. Other: No abdominopelvic free air or fluid. No bowel containing hernia. Small fat containing umbilical hernia. No traumatic abdominal wall dehiscence. No body wall or retroperitoneal hematoma. Musculoskeletal: The osseous structures appear diffusely demineralized which may limit detection of small or nondisplaced fractures. No acute osseous abnormality or suspicious osseous lesion. Extensive streak artifact related to patient's right hip arthroplasty. Multilevel degenerative changes are present in the imaged portions of the spine. Additional degenerative changes in the hips and pelvis. No acute osseous abnormality of the included upper extremities. IMPRESSION: CT HEAD: 1. No acute intracranial abnormality. 2. Stable parenchymal volume loss and chronic microvascular ischemic changes. 3. Debris in the external auditory canals. CT CERVICAL SPINE: 1. No acute cervical spine fracture or traumatic listhesis. 2. Multilevel degenerative changes of the cervical spine, as described above. CT CHEST, ABDOMEN AND PELVIS 1. Unenhanced CT was performed per clinician order. Lack of IV contrast limits sensitivity and specificity, especially for evaluation of the vasculature and abdominal/pelvic solid viscera. 2. Posterior left third through sixth rib fractures. Posterior right rib 4 rib fractures. Can be seen in the setting of resuscitative efforts. No pneumothorax. 3. Heterogeneous largely dependent areas of mixed ground-glass and dense consolidation in both lungs with  fluid-filled dependent airways as well as septal and fissural thickening, and small bilateral pleural effusions as well as features of volume loss. Such findings are worrisome for sequela of aspiration likely with superimposed pulmonary edema and volume loss. Mild contusive changes may also be present in the setting of resuscitation. 4. Endotracheal tube in the mid to lower trachea, 3 cm from the carina. 5. Transesophageal tube in appropriate position terminating in the gastric body with side port distal to the GE junction. 6. No other acute abdominopelvic abnormalities. 7. Nonobstructing right nephrolithiasis. 8. Hyperdense cystic structures arising from the lateral right interpolar kidney and upper pole left kidney., possible hyperdense exophytic cyst. Recommend outpatient renal ultrasound. 9. Aortic Atherosclerosis (ICD10-I70.0). Electronically Signed   By: Lovena Le M.D.   On: 11/30/2019 02:57   Portable Chest xray  Result Date: 12/01/2019 CLINICAL DATA:  Acute respiratory failure EXAM: PORTABLE CHEST 1 VIEW COMPARISON:  Chest x-rays dated 11/30/2019 and 09/16/2019. FINDINGS: Heart  size and mediastinal contours are stable. Endotracheal tube has been placed with tip adequately positioned approximately 4 cm above the carina. Enteric tube in place. Pacer pad overlies the LEFT heart. Central pulmonary vascular congestion and probable interstitial edema. No evidence of pneumonia. No pleural effusion or pneumothorax is seen. IMPRESSION: 1. Endotracheal tube adequately positioned with tip approximately 4 cm above the carina. 2. Probable mild interstitial edema. Electronically Signed   By: Franki Cabot M.D.   On: 12/01/2019 06:05   DG Chest Portable 1 View  Result Date: 11/30/2019 CLINICAL DATA:  Shortness of breath EXAM: PORTABLE CHEST 1 VIEW COMPARISON:  09/16/2019 FINDINGS: Mild cardiomegaly. Diffuse interstitial opacity. No pleural effusion or pneumothorax. No focal airspace consolidation. IMPRESSION:  Cardiomegaly and mild pulmonary edema. Electronically Signed   By: Ulyses Jarred M.D.   On: 11/30/2019 01:55   ECHOCARDIOGRAM COMPLETE  Result Date: 11/30/2019    ECHOCARDIOGRAM REPORT   Patient Name:   [name] Date of Exam: 11/30/2019 Medical Rec #:  [id number]             Height:       67.0 in Accession #:    [id number]            Weight:       179.5 lb Date of Birth:  1935-03-19            BSA:          1.931 m Patient Age:    27 years              BP:           120/64 mmHg Patient Gender: M                     HR:           63 bpm. Exam Location:  [facility] Procedure: 2D Echo, Cardiac Doppler and Color Doppler Indications:     Abnormal ECG 794.31  History:         Patient has prior history of Echocardiogram examinations, most                  recent 06/25/2019. CHF, Arrythmias:Atrial Fibrillation; Risk                  Factors:Hypertension and Diabetes. PVD.  Sonographer:     Sherrie Sport RDCS (AE) Referring Phys:  [id number] Bradly Bienenstock Diagnosing Phys: Ida Rogue MD  Sonographer Comments: Technically difficult study due to poor echo windows, no apical window, no parasternal window and echo performed with patient supine and on artificial respirator. IMPRESSIONS  1. Left ventricular ejection fraction, by estimation, is 50 to 55%. The left ventricle has low normal function. Unable to exclude regional wall motion abnormality.There is mild left ventricular hypertrophy. Left ventricular diastolic parameters are indeterminate.  2. Right ventricular systolic function was not well visualized. The right ventricular size is normal.  3. Challenging image quality FINDINGS  Left Ventricle: Left ventricular ejection fraction, by estimation, is 50 to 55%. The left ventricle has low normal function. The left ventricle has no regional wall motion abnormalities. The left ventricular internal cavity size was normal in size. There is mild left ventricular hypertrophy. Left ventricular diastolic parameters are  indeterminate. Right Ventricle: The right ventricular size is normal. No increase in right ventricular wall thickness. Right ventricular systolic function was not well visualized. Left Atrium: Left atrial size was not well visualized. Right Atrium: Right atrial size was  not well visualized. Pericardium: There is no evidence of pericardial effusion. Mitral Valve: The mitral valve was not well visualized. Normal mobility of the mitral valve leaflets. No evidence of mitral valve regurgitation. No evidence of mitral valve stenosis. Tricuspid Valve: The tricuspid valve is not well visualized. Tricuspid valve regurgitation is not demonstrated. No evidence of tricuspid stenosis. Aortic Valve: The aortic valve was not well visualized. Aortic valve regurgitation is not visualized. No aortic stenosis is present. Pulmonic Valve: The pulmonic valve was not well visualized. Pulmonic valve regurgitation is not visualized. No evidence of pulmonic stenosis. Aorta: The aortic root was not well visualized and the ascending aorta was not well visualized. Venous: The pulmonary veins were not well visualized. The inferior vena cava is normal in size with greater than 50% respiratory variability, suggesting right atrial pressure of 3 mmHg. IAS/Shunts: No atrial level shunt detected by color flow Doppler.  LEFT VENTRICLE PLAX 2D LVIDd:         4.95 cm LVIDs:         3.32 cm LV PW:         1.61 cm LV IVS:        1.49 cm  LEFT ATRIUM         Index LA diam:    4.80 cm 2.49 cm/m  PULMONIC VALVE PV Vmax:        0.86 m/s PV Peak grad:   3.0 mmHg RVOT Peak grad: 4 mmHg  Ida Rogue MD Electronically signed by Ida Rogue MD Signature Date/Time: 11/30/2019/6:57:51 PM    Final      Assessment and Recommendation  85 y.o. male with known severe peripheral vascular disease diabetes hypertension hyperlipidemia sleep apnea with apparent hypoxia and acute on chronic diastolic dysfunction congestive heart failure causing seizure with atrial  fibrillation with rapid ventricular rate now spontaneously converted to normal sinus rhythm with amiodarone drip and stable from the cardiovascular standpoint without evidence of myocardial infarction 1. Continue supportive care for respiratory failure seizure disorder and/or hypoxia without restriction 2. Continue amiodarone orally at 200 mg twice per day for maintenance of normal sinus rhythm 3. Anticoagulation with Xarelto for further risk reduction in stroke with atrial fibrillation 4. No use of beta-blocker at this time due to concerns of bradycardia on top of use of amiodarone 5. No further cardiac diagnostics necessary at this time  Signed, Serafina Royals M.D. FACC

## 2019-12-01 NOTE — Progress Notes (Signed)
Subjective: Patient remains intubated and sedated    Objective: Current vital signs: BP (!) 120/55   Pulse (!) 59   Temp 98.6 F (37 C)   Resp (!) 22   Ht _0  (1.702 m)   Wt 83 kg   SpO2 100%   BMI 28.66 kg/m  Vital signs in last 24 hours: Temp:  [98.4 F (36.9 C)-99.3 F (37.4 C)] 98.6 F (37 C) (07/10 1100) Pulse Rate:  [54-67] 59 (07/10 1100) Resp:  [15-23] 22 (07/10 1100) BP: (108-153)/(53-64) 120/55 (07/10 1100) SpO2:  [99 %-100 %] 100 % (07/10 1122) FiO2 (%):  [35 %-60 %] 35 % (07/10 1122) Weight:  [83 kg] 83 kg (07/10 0500)  Intake/Output from previous day: 07/09 0701 - 07/10 0700 In: 1738.3 [I.V.:1307.5; IV Piggyback:430.9] Out: 555 [Urine:555] Intake/Output this shift: No intake/output data recorded. Nutritional status:  Diet Order            Diet NPO time specified  Diet effective now                 Neurologic Exam: Mental Status: Patient does not respond to verbal stimuli.  Grimaces to deep sternal rub.  Does not follow commands.  No verbalizations noted.  Cranial Nerves: II: patient does not respond confrontation bilaterally, pupils right 3 mm, left 3 mm,and reactive bilaterally III,IV,VI: Oculocephalic response absent bilaterally.  V,VII: corneal reflex absent bilaterally  VIII: patient does not respond to verbal stimuli IX,X: gag reflex reduced, XI: trapezius strength unable to test bilaterally XII: tongue strength unable to test Motor: Extremities flaccid throughout.  No spontaneous movement noted.  No purposeful movements noted. Sensory: Grimaces to noxious stimuli in all extremities.  Lab Results: Basic Metabolic Panel: Recent Labs  Lab 11/30/19 0109 11/30/19 0109 11/30/19 0533 11/30/19 0533 11/30/19 0841 11/30/19 1306 12/01/19 0422  NA 137  --  830  --  940 DUPLICATE REQUEST  768 088  K 4.0  --  3.7  --  4.0 DUPLICATE REQUEST  4.4 3.9  CL 99  --  110  --  315 DUPLICATE REQUEST  945 859  CO2 22  --  24  --  24 DUPLICATE  REQUEST  25 25  GLUCOSE 679*  --  292*  --  446* DUPLICATE REQUEST  286* 185*  BUN 28*  --  29*  --  30* DUPLICATE REQUEST  32* 32*  CREATININE 1.72*  --  3.81*  --  7.71* DUPLICATE REQUEST  1.65* 1.52*  CALCIUM 10.5*   < > 11.9*   < > 79.0* DUPLICATE REQUEST  38.3 9.4  MG 2.0  --   --   --   --   --  1.9   < > = values in this interval not displayed.    Liver Function Tests: Recent Labs  Lab 11/30/19 0109 12/01/19 0422  AST 43* 26  ALT 28 23  ALKPHOS 172* 81  BILITOT 1.3* 0.9  PROT 7.9 5.6*  ALBUMIN 3.5 2.5*   No results for input(s): LIPASE, AMYLASE in the last 168 hours. No results for input(s): AMMONIA in the last 168 hours.  CBC: Recent Labs  Lab 11/30/19 0109 12/01/19 0612  WBC 21.8* 17.2*  NEUTROABS 15.1* 14.1*  HGB 17.6* 12.7*  HCT 52.3* 36.9*  MCV 86.9 85.8  PLT 241 204    Cardiac Enzymes: No results for input(s): CKTOTAL, CKMB, CKMBINDEX, TROPONINI in the last 168 hours.  Lipid Panel: Recent Labs  Lab 11/30/19 0325 11/30/19 0841  CHOL  --  253*  TRIG 174*  --     CBG: Recent Labs  Lab 11/30/19 2022 11/30/19 2319 12/01/19 0324 12/01/19 0708 12/01/19 1153  GLUCAP 149* 172* 187* 174* 153*    Microbiology: Results for orders placed or performed during the hospital encounter of 11/30/19  SARS Coronavirus 2 by RT PCR (hospital order, performed in [facility] hospital lab) Nasopharyngeal Nasopharyngeal Swab     Status: None   Collection Time: 11/30/19  2:54 AM   Specimen: Nasopharyngeal Swab  Result Value Ref Range Status   SARS Coronavirus 2 NEGATIVE NEGATIVE Final    Comment: (NOTE) SARS-CoV-2 target nucleic acids are NOT DETECTED.  The SARS-CoV-2 RNA is generally detectable in upper and lower respiratory specimens during the acute phase of infection. The lowest concentration of SARS-CoV-2 viral copies this assay can detect is 250 copies / mL. A negative result does not preclude SARS-CoV-2 infection and should not be used as the sole  basis for treatment or other patient management decisions.  A negative result may occur with improper specimen collection / handling, submission of specimen other than nasopharyngeal swab, presence of viral mutation(s) within the areas targeted by this assay, and inadequate number of viral copies (<250 copies / mL). A negative result must be combined with clinical observations, patient history, and epidemiological information.  Fact Sheet for Patients:   StrictlyIdeas.no  Fact Sheet for Healthcare Providers: BankingDealers.co.za  This test is not yet approved or  cleared by the [place] FDA and has been authorized for detection and/or diagnosis of SARS-CoV-2 by FDA under an Emergency Use Authorization (EUA).  This EUA will remain in effect (meaning this test can be used) for the duration of the COVID-19 declaration under Section 564(b)(1) of the Act, 21 U.S.C. section 360bbb-3(b)(1), unless the authorization is terminated or revoked sooner.  Performed at [facility], [address] [place], [place] [address]   MRSA PCR Screening     Status: None   Collection Time: 11/30/19  4:44 AM   Specimen: Nasopharyngeal  Result Value Ref Range Status   MRSA by PCR NEGATIVE NEGATIVE Final    Comment:        The GeneXpert MRSA Assay (FDA approved for NASAL specimens only), is one component of a comprehensive MRSA colonization surveillance program. It is not intended to diagnose MRSA infection nor to guide or monitor treatment for MRSA infections. Performed at [facility], [address] [place], [place] [address]   CULTURE, BLOOD (ROUTINE X 2) w Reflex to ID Panel     Status: None (Preliminary result)   Collection Time: 11/30/19  5:33 AM   Specimen: Left Antecubital; Blood  Result Value Ref Range Status   Specimen Description LEFT ANTECUBITAL  Final   Special Requests   Final    BOTTLES DRAWN AEROBIC AND  ANAEROBIC Blood Culture adequate volume   Culture   Final    NO GROWTH 1 DAY Performed at [facility], [address] [place], [place] [address]    Report Status PENDING  Incomplete  CULTURE, BLOOD (ROUTINE X 2) w Reflex to ID Panel     Status: None (Preliminary result)   Collection Time: 11/30/19  5:42 AM   Specimen: BLOOD LEFT FOREARM  Result Value Ref Range Status   Specimen Description BLOOD LEFT FOREARM  Final   Special Requests   Final    BOTTLES DRAWN AEROBIC AND ANAEROBIC Blood Culture adequate volume   Culture   Final  NO GROWTH 1 DAY Performed at [facility], [address] [place], [place] [address]    Report Status PENDING  Incomplete  Culture, respiratory     Status: None (Preliminary result)   Collection Time: 11/30/19 11:18 AM   Specimen: Tracheal Aspirate; Respiratory  Result Value Ref Range Status   Specimen Description   Final    TRACHEAL ASPIRATE Performed at [facility], [address], [place], [place] [address]    Special Requests   Final    NONE Performed at [facility], [address] [place], [place] [address]    Gram Stain   Final    FEW WBC PRESENT, PREDOMINANTLY PMN FEW GRAM POSITIVE COCCI RARE YEAST    Culture   Final    CULTURE REINCUBATED FOR BETTER GROWTH Performed at [facility], [address] [place], [place] [address]    Report Status PENDING  Incomplete    Coagulation Studies: Recent Labs    11/30/19 0109  LABPROT 13.9  INR 1.1    Imaging: EEG  Result Date: 11/30/2019 Alexis Goodell, MD     11/30/2019  4:20 PM ELECTROENCEPHALOGRAM REPORT Patient: [name]       Room #: IC06A-AA EEG No. ID: [id number] Age: 85 y.o.        Sex: male Requesting Physician: Lanney Gins Report Date:  11/30/2019       Interpreting Physician: Alexis Goodell History: Liston Thum is an 85 y.o. male s/p arrest and seizure Medications: Keppra, Unasyn, Insulin, Propofol, Vasopressin  Conditions of Recording:  This is a 21 channel routine scalp EEG performed with bipolar and monopolar montages arranged in accordance to the international 10/20 system of electrode placement. One channel was dedicated to EKG recording. The patient is in the intubated and sedated state. Description:  The background activity is discontinuous. It consists of low voltage bursts of polyspike, spike and slow wave activity alternating with periods of attenuation. The burst activity lasts up to 4 seconds.  The periods of attenuation last up to 6 seconds. This discontinuous activity is maintained throughout the tracing. No activation procedures were performed.  Hyperventilation and intermittent photic stimulation were not performed. IMPRESSION: This is an abnormal EEG due to a burst-suppression pattern seen throughout the tracing.  Burst suppression pattern can be seen in a variety of circumstances, including anesthesia, drug intoxication, hypothermia, as well as cerebral anoxia.  Clinical/neurological, and radiographic correlation advised.  Alexis Goodell, MD Neurology [phone] 11/30/2019, 4:16 PM   CT ABDOMEN PELVIS WO CONTRAST  Result Date: 11/30/2019 CLINICAL DATA:  Found down with seizure-like activity, V-tach, seizure while in ED EXAM: CT HEAD WITHOUT CONTRAST CT CERVICAL SPINE WITHOUT CONTRAST CT CHEST, ABDOMEN AND PELVIS WITHOUT CONTRAST TECHNIQUE: Contiguous axial images were obtained from the base of the skull through the vertex without intravenous contrast. Multidetector CT imaging of the cervical spine was performed without intravenous contrast. Multiplanar CT image reconstructions were also generated. Multidetector CT imaging of the chest, abdomen and pelvis was performed following the standard protocol without IV contrast. COMPARISON:  CT head 09/16/2019, MR 06/05/2018, same day chest radiograph, CT abdomen pelvis 09/17/2006 FINDINGS: CT HEAD FINDINGS Brain: No evidence of acute infarction, hemorrhage,  hydrocephalus, extra-axial collection or mass lesion/mass effect. Symmetric prominence of the ventricles, cisterns and sulci compatible with parenchymal volume loss. Patchy areas of white matter hypoattenuation are most compatible with chronic microvascular angiopathy. Vascular: Atherosclerotic calcification of the carotid siphons and intradural vertebral arteries. No hyperdense vessel. Skull: No  acute scalp swelling or hematoma. No calvarial fracture or other acute or suspicious osseous lesion. Chronic left occipital scalp infiltration, likely scarring. Small right frontal scalp lipoma, stable. Sinuses/Orbits: Paranasal sinuses and mastoid air cells are predominantly clear. Pneumatization of the petrous apices. Orbital structures are unremarkable aside from prior lens extractions. Other: Extensive debris in the external auditory canals. Mild bilateral TMJ arthrosis. Largely edentulous with mild mandibular prognathism. CT CERVICAL FINDINGS Alignment: Stabilization collar is absent. Mild straightening of the upper cervical lordosis with reversal at the C4-5 levels. Mild retrolisthesis C4 on 5 and C5 on 6 is likely degenerative in nature. No evidence of traumatic listhesis. No abnormally widened, perched or jumped facets. Normal alignment of the craniocervical and atlantoaxial articulations accounting for leftward cranial rotation. Skull base and vertebrae: No acute skull base fracture. No vertebral fracture or height loss. Arthrosis present at the atlantodental interval with some mild calcific pannus formation. Multilevel spondylitic changes are detailed below. Soft tissues and spinal canal: No pre or paravertebral fluid or swelling. No visible canal hematoma. Enthesopathic changes in the nuchal ligament. Disc levels: Multilevel intervertebral disc height loss with spondylitic endplate changes. Features maximal C4-5 and C5-6 with small posterior disc osteophyte complexes resulting in at most mild canal stenosis.  Uncinate spurring facet hypertrophic changes are maximal at these levels as well with moderate to severe stenosis on the left at C5-6 and more moderate changes on the right at C4-5. Additional multilevel foraminal narrowing is mild. Other: Secretions noted in the posterior naso and oropharynx above the level of the inflated endotracheal balloon. Extensive cervical carotid atherosclerosis. No concerning thyroid nodules. CT CHEST FINDINGS Cardiovascular: Limited assessment of the vasculature in the absence of contrast media. Cardiac size within normal limits. Small pericardial effusion. Extensive three-vessel coronary artery disease. Extensive calcifications upon the aortic valve leaflets. Atherosclerotic plaque within the normal caliber aorta. Shared origin of brachiocephalic and left common carotid artery origins. Calcific plaque throughout the proximal great vessels. Central pulmonary arteries are borderline enlarged. Luminal evaluation precluded in the absence of contrast media. Mediastinum/Nodes: No mediastinal fluid or gas. Normal thyroid gland and thoracic inlet. No worrisome mediastinal or axillary adenopathy. Hilar nodal evaluation is limited in the absence of intravenous contrast media. Endotracheal tube terminates in the mid to lower trachea, 3 cm from the carina. Transesophageal tube tip terminates in the gastric body with side port distal to the GE junction. Lungs/Pleura: Heterogeneous dependent areas of mixed ground-glass and dense consolidative opacities are present in both lungs with multiple fluid-filled dependent airways as well as interlobular septal thickening and fissural thickening and small bilateral pleural effusions with further atelectatic changes and volume loss as well. No pneumothorax. Musculoskeletal: Posterior left third through sixth left rib fractures. Posterior right rib 4 rib fractures. No other acute osseous injuries in the chest wall. Asymmetric sternoclavicular arthrosis,  increased on the right. Degenerative changes in both shoulders. Multilevel degenerative changes present spine Multilevel flowing anterior osteophytosis, compatible with features of diffuse idiopathic skeletal hyperostosis (DISH). Mild body wall edema. CT ABDOMEN AND PELVIS FINDINGS Hepatobiliary: No visible hepatic injury or concerning liver lesion on this unenhanced CT. No perihepatic hematoma. Smooth surface contour. Normal attenuation. Patient is post cholecystectomy. Slight prominence of the biliary tree likely related to post cholecystectomy reservoir effect without visible calcified intraductal gallstones. Pancreas: Moderate pancreatic atrophy and partial fatty replacement of the pancreas. No ductal dilatation, discernible lesions, or inflammation. Spleen: No direct splenic injury. No perisplenic hematoma. Normal splenic size. No concerning lesions. Few scattered vascular calcifications.  Adrenals/Urinary Tract: Mild lobular thickening of the adrenal gland could reflect some senescent hyperplasia without concerning nodules. No evidence of adrenal hemorrhage. No direct renal injury or perinephric hematoma. Bilateral renal atrophy with mild symmetric perinephric stranding, possibly related to senescent change or diminished renal function. Few vascular calcifications are present in both kidneys. Collecting system calculus seen in the upper pole right kidney. No obstructive urolithiasis is evident. Hyperdense 18 mm cystic structure arising from the posterior interpolar right kidney (3/72) additional subcentimeter hyperdense cystic structure arising from the upper pole left kidney (3/65). No other concerning renal lesions. Urinary bladder is largely decompressed by Foley catheter at the time of exam and therefore poorly evaluated by CT imaging. Further obscuration by streak artifact from a hip prosthesis. Stomach/Bowel: Transesophageal tube in appropriate position terminating in the gastric body with side port  distal to the GE junction as detailed above. Mild tenting of the anterior stomach is a typical finding for placement of a transesophageal tube. Small duodenal lipoma. Small distal duodenal diverticulum (3/69). No small bowel thickening or dilatation. Mild fecalization of the distal small bowel contents can reflect some slowed intestinal transit Appendix is not visualized. No focal inflammation the vicinity of the cecum to suggest an occult appendicitis. No colonic dilatation or wall thickening. Vascular/Lymphatic: Extensive atherosclerotic calcification throughout the abdominal aorta and branch vessels. Luminal evaluation precluded in the absence of contrast media. No aneurysm or ectasia. No periaortic stranding or hemorrhage. No suspicious or enlarged lymph nodes in the included lymphatic chains. Reproductive: In prostate traversed by the Foley catheter. Coarse eccentric calcification of the prostate. No concerning abnormalities of the prostate or seminal vesicles. Nonspecific penile calcifications are noted. Other: No abdominopelvic free air or fluid. No bowel containing hernia. Small fat containing umbilical hernia. No traumatic abdominal wall dehiscence. No body wall or retroperitoneal hematoma. Musculoskeletal: The osseous structures appear diffusely demineralized which may limit detection of small or nondisplaced fractures. No acute osseous abnormality or suspicious osseous lesion. Extensive streak artifact related to patient's right hip arthroplasty. Multilevel degenerative changes are present in the imaged portions of the spine. Additional degenerative changes in the hips and pelvis. No acute osseous abnormality of the included upper extremities. IMPRESSION: CT HEAD: 1. No acute intracranial abnormality. 2. Stable parenchymal volume loss and chronic microvascular ischemic changes. 3. Debris in the external auditory canals. CT CERVICAL SPINE: 1. No acute cervical spine fracture or traumatic listhesis. 2.  Multilevel degenerative changes of the cervical spine, as described above. CT CHEST, ABDOMEN AND PELVIS 1. Unenhanced CT was performed per clinician order. Lack of IV contrast limits sensitivity and specificity, especially for evaluation of the vasculature and abdominal/pelvic solid viscera. 2. Posterior left third through sixth rib fractures. Posterior right rib 4 rib fractures. Can be seen in the setting of resuscitative efforts. No pneumothorax. 3. Heterogeneous largely dependent areas of mixed ground-glass and dense consolidation in both lungs with fluid-filled dependent airways as well as septal and fissural thickening, and small bilateral pleural effusions as well as features of volume loss. Such findings are worrisome for sequela of aspiration likely with superimposed pulmonary edema and volume loss. Mild contusive changes may also be present in the setting of resuscitation. 4. Endotracheal tube in the mid to lower trachea, 3 cm from the carina. 5. Transesophageal tube in appropriate position terminating in the gastric body with side port distal to the GE junction. 6. No other acute abdominopelvic abnormalities. 7. Nonobstructing right nephrolithiasis. 8. Hyperdense cystic structures arising from the lateral right interpolar kidney  and upper pole left kidney., possible hyperdense exophytic cyst. Recommend outpatient renal ultrasound. 9. Aortic Atherosclerosis (ICD10-I70.0). Electronically Signed   By: Lovena Le M.D.   On: 11/30/2019 02:57   CT Head Wo Contrast  Result Date: 11/30/2019 CLINICAL DATA:  Found down with seizure-like activity, V-tach, seizure while in ED EXAM: CT HEAD WITHOUT CONTRAST CT CERVICAL SPINE WITHOUT CONTRAST CT CHEST, ABDOMEN AND PELVIS WITHOUT CONTRAST TECHNIQUE: Contiguous axial images were obtained from the base of the skull through the vertex without intravenous contrast. Multidetector CT imaging of the cervical spine was performed without intravenous contrast. Multiplanar CT  image reconstructions were also generated. Multidetector CT imaging of the chest, abdomen and pelvis was performed following the standard protocol without IV contrast. COMPARISON:  CT head 09/16/2019, MR 06/05/2018, same day chest radiograph, CT abdomen pelvis 09/17/2006 FINDINGS: CT HEAD FINDINGS Brain: No evidence of acute infarction, hemorrhage, hydrocephalus, extra-axial collection or mass lesion/mass effect. Symmetric prominence of the ventricles, cisterns and sulci compatible with parenchymal volume loss. Patchy areas of white matter hypoattenuation are most compatible with chronic microvascular angiopathy. Vascular: Atherosclerotic calcification of the carotid siphons and intradural vertebral arteries. No hyperdense vessel. Skull: No acute scalp swelling or hematoma. No calvarial fracture or other acute or suspicious osseous lesion. Chronic left occipital scalp infiltration, likely scarring. Small right frontal scalp lipoma, stable. Sinuses/Orbits: Paranasal sinuses and mastoid air cells are predominantly clear. Pneumatization of the petrous apices. Orbital structures are unremarkable aside from prior lens extractions. Other: Extensive debris in the external auditory canals. Mild bilateral TMJ arthrosis. Largely edentulous with mild mandibular prognathism. CT CERVICAL FINDINGS Alignment: Stabilization collar is absent. Mild straightening of the upper cervical lordosis with reversal at the C4-5 levels. Mild retrolisthesis C4 on 5 and C5 on 6 is likely degenerative in nature. No evidence of traumatic listhesis. No abnormally widened, perched or jumped facets. Normal alignment of the craniocervical and atlantoaxial articulations accounting for leftward cranial rotation. Skull base and vertebrae: No acute skull base fracture. No vertebral fracture or height loss. Arthrosis present at the atlantodental interval with some mild calcific pannus formation. Multilevel spondylitic changes are detailed below. Soft  tissues and spinal canal: No pre or paravertebral fluid or swelling. No visible canal hematoma. Enthesopathic changes in the nuchal ligament. Disc levels: Multilevel intervertebral disc height loss with spondylitic endplate changes. Features maximal C4-5 and C5-6 with small posterior disc osteophyte complexes resulting in at most mild canal stenosis. Uncinate spurring facet hypertrophic changes are maximal at these levels as well with moderate to severe stenosis on the left at C5-6 and more moderate changes on the right at C4-5. Additional multilevel foraminal narrowing is mild. Other: Secretions noted in the posterior naso and oropharynx above the level of the inflated endotracheal balloon. Extensive cervical carotid atherosclerosis. No concerning thyroid nodules. CT CHEST FINDINGS Cardiovascular: Limited assessment of the vasculature in the absence of contrast media. Cardiac size within normal limits. Small pericardial effusion. Extensive three-vessel coronary artery disease. Extensive calcifications upon the aortic valve leaflets. Atherosclerotic plaque within the normal caliber aorta. Shared origin of brachiocephalic and left common carotid artery origins. Calcific plaque throughout the proximal great vessels. Central pulmonary arteries are borderline enlarged. Luminal evaluation precluded in the absence of contrast media. Mediastinum/Nodes: No mediastinal fluid or gas. Normal thyroid gland and thoracic inlet. No worrisome mediastinal or axillary adenopathy. Hilar nodal evaluation is limited in the absence of intravenous contrast media. Endotracheal tube terminates in the mid to lower trachea, 3 cm from the carina. Transesophageal tube tip  terminates in the gastric body with side port distal to the GE junction. Lungs/Pleura: Heterogeneous dependent areas of mixed ground-glass and dense consolidative opacities are present in both lungs with multiple fluid-filled dependent airways as well as interlobular septal  thickening and fissural thickening and small bilateral pleural effusions with further atelectatic changes and volume loss as well. No pneumothorax. Musculoskeletal: Posterior left third through sixth left rib fractures. Posterior right rib 4 rib fractures. No other acute osseous injuries in the chest wall. Asymmetric sternoclavicular arthrosis, increased on the right. Degenerative changes in both shoulders. Multilevel degenerative changes present spine Multilevel flowing anterior osteophytosis, compatible with features of diffuse idiopathic skeletal hyperostosis (DISH). Mild body wall edema. CT ABDOMEN AND PELVIS FINDINGS Hepatobiliary: No visible hepatic injury or concerning liver lesion on this unenhanced CT. No perihepatic hematoma. Smooth surface contour. Normal attenuation. Patient is post cholecystectomy. Slight prominence of the biliary tree likely related to post cholecystectomy reservoir effect without visible calcified intraductal gallstones. Pancreas: Moderate pancreatic atrophy and partial fatty replacement of the pancreas. No ductal dilatation, discernible lesions, or inflammation. Spleen: No direct splenic injury. No perisplenic hematoma. Normal splenic size. No concerning lesions. Few scattered vascular calcifications. Adrenals/Urinary Tract: Mild lobular thickening of the adrenal gland could reflect some senescent hyperplasia without concerning nodules. No evidence of adrenal hemorrhage. No direct renal injury or perinephric hematoma. Bilateral renal atrophy with mild symmetric perinephric stranding, possibly related to senescent change or diminished renal function. Few vascular calcifications are present in both kidneys. Collecting system calculus seen in the upper pole right kidney. No obstructive urolithiasis is evident. Hyperdense 18 mm cystic structure arising from the posterior interpolar right kidney (3/72) additional subcentimeter hyperdense cystic structure arising from the upper pole left  kidney (3/65). No other concerning renal lesions. Urinary bladder is largely decompressed by Foley catheter at the time of exam and therefore poorly evaluated by CT imaging. Further obscuration by streak artifact from a hip prosthesis. Stomach/Bowel: Transesophageal tube in appropriate position terminating in the gastric body with side port distal to the GE junction as detailed above. Mild tenting of the anterior stomach is a typical finding for placement of a transesophageal tube. Small duodenal lipoma. Small distal duodenal diverticulum (3/69). No small bowel thickening or dilatation. Mild fecalization of the distal small bowel contents can reflect some slowed intestinal transit Appendix is not visualized. No focal inflammation the vicinity of the cecum to suggest an occult appendicitis. No colonic dilatation or wall thickening. Vascular/Lymphatic: Extensive atherosclerotic calcification throughout the abdominal aorta and branch vessels. Luminal evaluation precluded in the absence of contrast media. No aneurysm or ectasia. No periaortic stranding or hemorrhage. No suspicious or enlarged lymph nodes in the included lymphatic chains. Reproductive: In prostate traversed by the Foley catheter. Coarse eccentric calcification of the prostate. No concerning abnormalities of the prostate or seminal vesicles. Nonspecific penile calcifications are noted. Other: No abdominopelvic free air or fluid. No bowel containing hernia. Small fat containing umbilical hernia. No traumatic abdominal wall dehiscence. No body wall or retroperitoneal hematoma. Musculoskeletal: The osseous structures appear diffusely demineralized which may limit detection of small or nondisplaced fractures. No acute osseous abnormality or suspicious osseous lesion. Extensive streak artifact related to patient's right hip arthroplasty. Multilevel degenerative changes are present in the imaged portions of the spine. Additional degenerative changes in the hips  and pelvis. No acute osseous abnormality of the included upper extremities. IMPRESSION: CT HEAD: 1. No acute intracranial abnormality. 2. Stable parenchymal volume loss and chronic microvascular ischemic changes. 3. Debris in  the external auditory canals. CT CERVICAL SPINE: 1. No acute cervical spine fracture or traumatic listhesis. 2. Multilevel degenerative changes of the cervical spine, as described above. CT CHEST, ABDOMEN AND PELVIS 1. Unenhanced CT was performed per clinician order. Lack of IV contrast limits sensitivity and specificity, especially for evaluation of the vasculature and abdominal/pelvic solid viscera. 2. Posterior left third through sixth rib fractures. Posterior right rib 4 rib fractures. Can be seen in the setting of resuscitative efforts. No pneumothorax. 3. Heterogeneous largely dependent areas of mixed ground-glass and dense consolidation in both lungs with fluid-filled dependent airways as well as septal and fissural thickening, and small bilateral pleural effusions as well as features of volume loss. Such findings are worrisome for sequela of aspiration likely with superimposed pulmonary edema and volume loss. Mild contusive changes may also be present in the setting of resuscitation. 4. Endotracheal tube in the mid to lower trachea, 3 cm from the carina. 5. Transesophageal tube in appropriate position terminating in the gastric body with side port distal to the GE junction. 6. No other acute abdominopelvic abnormalities. 7. Nonobstructing right nephrolithiasis. 8. Hyperdense cystic structures arising from the lateral right interpolar kidney and upper pole left kidney., possible hyperdense exophytic cyst. Recommend outpatient renal ultrasound. 9. Aortic Atherosclerosis (ICD10-I70.0). Electronically Signed   By: Lovena Le M.D.   On: 11/30/2019 02:57   CT Chest Wo Contrast  Result Date: 11/30/2019 CLINICAL DATA:  Found down with seizure-like activity, V-tach, seizure while in ED  EXAM: CT HEAD WITHOUT CONTRAST CT CERVICAL SPINE WITHOUT CONTRAST CT CHEST, ABDOMEN AND PELVIS WITHOUT CONTRAST TECHNIQUE: Contiguous axial images were obtained from the base of the skull through the vertex without intravenous contrast. Multidetector CT imaging of the cervical spine was performed without intravenous contrast. Multiplanar CT image reconstructions were also generated. Multidetector CT imaging of the chest, abdomen and pelvis was performed following the standard protocol without IV contrast. COMPARISON:  CT head 09/16/2019, MR 06/05/2018, same day chest radiograph, CT abdomen pelvis 09/17/2006 FINDINGS: CT HEAD FINDINGS Brain: No evidence of acute infarction, hemorrhage, hydrocephalus, extra-axial collection or mass lesion/mass effect. Symmetric prominence of the ventricles, cisterns and sulci compatible with parenchymal volume loss. Patchy areas of white matter hypoattenuation are most compatible with chronic microvascular angiopathy. Vascular: Atherosclerotic calcification of the carotid siphons and intradural vertebral arteries. No hyperdense vessel. Skull: No acute scalp swelling or hematoma. No calvarial fracture or other acute or suspicious osseous lesion. Chronic left occipital scalp infiltration, likely scarring. Small right frontal scalp lipoma, stable. Sinuses/Orbits: Paranasal sinuses and mastoid air cells are predominantly clear. Pneumatization of the petrous apices. Orbital structures are unremarkable aside from prior lens extractions. Other: Extensive debris in the external auditory canals. Mild bilateral TMJ arthrosis. Largely edentulous with mild mandibular prognathism. CT CERVICAL FINDINGS Alignment: Stabilization collar is absent. Mild straightening of the upper cervical lordosis with reversal at the C4-5 levels. Mild retrolisthesis C4 on 5 and C5 on 6 is likely degenerative in nature. No evidence of traumatic listhesis. No abnormally widened, perched or jumped facets. Normal  alignment of the craniocervical and atlantoaxial articulations accounting for leftward cranial rotation. Skull base and vertebrae: No acute skull base fracture. No vertebral fracture or height loss. Arthrosis present at the atlantodental interval with some mild calcific pannus formation. Multilevel spondylitic changes are detailed below. Soft tissues and spinal canal: No pre or paravertebral fluid or swelling. No visible canal hematoma. Enthesopathic changes in the nuchal ligament. Disc levels: Multilevel intervertebral disc height loss with spondylitic endplate  changes. Features maximal C4-5 and C5-6 with small posterior disc osteophyte complexes resulting in at most mild canal stenosis. Uncinate spurring facet hypertrophic changes are maximal at these levels as well with moderate to severe stenosis on the left at C5-6 and more moderate changes on the right at C4-5. Additional multilevel foraminal narrowing is mild. Other: Secretions noted in the posterior naso and oropharynx above the level of the inflated endotracheal balloon. Extensive cervical carotid atherosclerosis. No concerning thyroid nodules. CT CHEST FINDINGS Cardiovascular: Limited assessment of the vasculature in the absence of contrast media. Cardiac size within normal limits. Small pericardial effusion. Extensive three-vessel coronary artery disease. Extensive calcifications upon the aortic valve leaflets. Atherosclerotic plaque within the normal caliber aorta. Shared origin of brachiocephalic and left common carotid artery origins. Calcific plaque throughout the proximal great vessels. Central pulmonary arteries are borderline enlarged. Luminal evaluation precluded in the absence of contrast media. Mediastinum/Nodes: No mediastinal fluid or gas. Normal thyroid gland and thoracic inlet. No worrisome mediastinal or axillary adenopathy. Hilar nodal evaluation is limited in the absence of intravenous contrast media. Endotracheal tube terminates in the  mid to lower trachea, 3 cm from the carina. Transesophageal tube tip terminates in the gastric body with side port distal to the GE junction. Lungs/Pleura: Heterogeneous dependent areas of mixed ground-glass and dense consolidative opacities are present in both lungs with multiple fluid-filled dependent airways as well as interlobular septal thickening and fissural thickening and small bilateral pleural effusions with further atelectatic changes and volume loss as well. No pneumothorax. Musculoskeletal: Posterior left third through sixth left rib fractures. Posterior right rib 4 rib fractures. No other acute osseous injuries in the chest wall. Asymmetric sternoclavicular arthrosis, increased on the right. Degenerative changes in both shoulders. Multilevel degenerative changes present spine Multilevel flowing anterior osteophytosis, compatible with features of diffuse idiopathic skeletal hyperostosis (DISH). Mild body wall edema. CT ABDOMEN AND PELVIS FINDINGS Hepatobiliary: No visible hepatic injury or concerning liver lesion on this unenhanced CT. No perihepatic hematoma. Smooth surface contour. Normal attenuation. Patient is post cholecystectomy. Slight prominence of the biliary tree likely related to post cholecystectomy reservoir effect without visible calcified intraductal gallstones. Pancreas: Moderate pancreatic atrophy and partial fatty replacement of the pancreas. No ductal dilatation, discernible lesions, or inflammation. Spleen: No direct splenic injury. No perisplenic hematoma. Normal splenic size. No concerning lesions. Few scattered vascular calcifications. Adrenals/Urinary Tract: Mild lobular thickening of the adrenal gland could reflect some senescent hyperplasia without concerning nodules. No evidence of adrenal hemorrhage. No direct renal injury or perinephric hematoma. Bilateral renal atrophy with mild symmetric perinephric stranding, possibly related to senescent change or diminished renal  function. Few vascular calcifications are present in both kidneys. Collecting system calculus seen in the upper pole right kidney. No obstructive urolithiasis is evident. Hyperdense 18 mm cystic structure arising from the posterior interpolar right kidney (3/72) additional subcentimeter hyperdense cystic structure arising from the upper pole left kidney (3/65). No other concerning renal lesions. Urinary bladder is largely decompressed by Foley catheter at the time of exam and therefore poorly evaluated by CT imaging. Further obscuration by streak artifact from a hip prosthesis. Stomach/Bowel: Transesophageal tube in appropriate position terminating in the gastric body with side port distal to the GE junction as detailed above. Mild tenting of the anterior stomach is a typical finding for placement of a transesophageal tube. Small duodenal lipoma. Small distal duodenal diverticulum (3/69). No small bowel thickening or dilatation. Mild fecalization of the distal small bowel contents can reflect some slowed intestinal transit  Appendix is not visualized. No focal inflammation the vicinity of the cecum to suggest an occult appendicitis. No colonic dilatation or wall thickening. Vascular/Lymphatic: Extensive atherosclerotic calcification throughout the abdominal aorta and branch vessels. Luminal evaluation precluded in the absence of contrast media. No aneurysm or ectasia. No periaortic stranding or hemorrhage. No suspicious or enlarged lymph nodes in the included lymphatic chains. Reproductive: In prostate traversed by the Foley catheter. Coarse eccentric calcification of the prostate. No concerning abnormalities of the prostate or seminal vesicles. Nonspecific penile calcifications are noted. Other: No abdominopelvic free air or fluid. No bowel containing hernia. Small fat containing umbilical hernia. No traumatic abdominal wall dehiscence. No body wall or retroperitoneal hematoma. Musculoskeletal: The osseous  structures appear diffusely demineralized which may limit detection of small or nondisplaced fractures. No acute osseous abnormality or suspicious osseous lesion. Extensive streak artifact related to patient's right hip arthroplasty. Multilevel degenerative changes are present in the imaged portions of the spine. Additional degenerative changes in the hips and pelvis. No acute osseous abnormality of the included upper extremities. IMPRESSION: CT HEAD: 1. No acute intracranial abnormality. 2. Stable parenchymal volume loss and chronic microvascular ischemic changes. 3. Debris in the external auditory canals. CT CERVICAL SPINE: 1. No acute cervical spine fracture or traumatic listhesis. 2. Multilevel degenerative changes of the cervical spine, as described above. CT CHEST, ABDOMEN AND PELVIS 1. Unenhanced CT was performed per clinician order. Lack of IV contrast limits sensitivity and specificity, especially for evaluation of the vasculature and abdominal/pelvic solid viscera. 2. Posterior left third through sixth rib fractures. Posterior right rib 4 rib fractures. Can be seen in the setting of resuscitative efforts. No pneumothorax. 3. Heterogeneous largely dependent areas of mixed ground-glass and dense consolidation in both lungs with fluid-filled dependent airways as well as septal and fissural thickening, and small bilateral pleural effusions as well as features of volume loss. Such findings are worrisome for sequela of aspiration likely with superimposed pulmonary edema and volume loss. Mild contusive changes may also be present in the setting of resuscitation. 4. Endotracheal tube in the mid to lower trachea, 3 cm from the carina. 5. Transesophageal tube in appropriate position terminating in the gastric body with side port distal to the GE junction. 6. No other acute abdominopelvic abnormalities. 7. Nonobstructing right nephrolithiasis. 8. Hyperdense cystic structures arising from the lateral right interpolar  kidney and upper pole left kidney., possible hyperdense exophytic cyst. Recommend outpatient renal ultrasound. 9. Aortic Atherosclerosis (ICD10-I70.0). Electronically Signed   By: Lovena Le M.D.   On: 11/30/2019 02:57   CT Cervical Spine Wo Contrast  Result Date: 11/30/2019 CLINICAL DATA:  Found down with seizure-like activity, V-tach, seizure while in ED EXAM: CT HEAD WITHOUT CONTRAST CT CERVICAL SPINE WITHOUT CONTRAST CT CHEST, ABDOMEN AND PELVIS WITHOUT CONTRAST TECHNIQUE: Contiguous axial images were obtained from the base of the skull through the vertex without intravenous contrast. Multidetector CT imaging of the cervical spine was performed without intravenous contrast. Multiplanar CT image reconstructions were also generated. Multidetector CT imaging of the chest, abdomen and pelvis was performed following the standard protocol without IV contrast. COMPARISON:  CT head 09/16/2019, MR 06/05/2018, same day chest radiograph, CT abdomen pelvis 09/17/2006 FINDINGS: CT HEAD FINDINGS Brain: No evidence of acute infarction, hemorrhage, hydrocephalus, extra-axial collection or mass lesion/mass effect. Symmetric prominence of the ventricles, cisterns and sulci compatible with parenchymal volume loss. Patchy areas of white matter hypoattenuation are most compatible with chronic microvascular angiopathy. Vascular: Atherosclerotic calcification of the carotid siphons and intradural  vertebral arteries. No hyperdense vessel. Skull: No acute scalp swelling or hematoma. No calvarial fracture or other acute or suspicious osseous lesion. Chronic left occipital scalp infiltration, likely scarring. Small right frontal scalp lipoma, stable. Sinuses/Orbits: Paranasal sinuses and mastoid air cells are predominantly clear. Pneumatization of the petrous apices. Orbital structures are unremarkable aside from prior lens extractions. Other: Extensive debris in the external auditory canals. Mild bilateral TMJ arthrosis. Largely  edentulous with mild mandibular prognathism. CT CERVICAL FINDINGS Alignment: Stabilization collar is absent. Mild straightening of the upper cervical lordosis with reversal at the C4-5 levels. Mild retrolisthesis C4 on 5 and C5 on 6 is likely degenerative in nature. No evidence of traumatic listhesis. No abnormally widened, perched or jumped facets. Normal alignment of the craniocervical and atlantoaxial articulations accounting for leftward cranial rotation. Skull base and vertebrae: No acute skull base fracture. No vertebral fracture or height loss. Arthrosis present at the atlantodental interval with some mild calcific pannus formation. Multilevel spondylitic changes are detailed below. Soft tissues and spinal canal: No pre or paravertebral fluid or swelling. No visible canal hematoma. Enthesopathic changes in the nuchal ligament. Disc levels: Multilevel intervertebral disc height loss with spondylitic endplate changes. Features maximal C4-5 and C5-6 with small posterior disc osteophyte complexes resulting in at most mild canal stenosis. Uncinate spurring facet hypertrophic changes are maximal at these levels as well with moderate to severe stenosis on the left at C5-6 and more moderate changes on the right at C4-5. Additional multilevel foraminal narrowing is mild. Other: Secretions noted in the posterior naso and oropharynx above the level of the inflated endotracheal balloon. Extensive cervical carotid atherosclerosis. No concerning thyroid nodules. CT CHEST FINDINGS Cardiovascular: Limited assessment of the vasculature in the absence of contrast media. Cardiac size within normal limits. Small pericardial effusion. Extensive three-vessel coronary artery disease. Extensive calcifications upon the aortic valve leaflets. Atherosclerotic plaque within the normal caliber aorta. Shared origin of brachiocephalic and left common carotid artery origins. Calcific plaque throughout the proximal great vessels. Central  pulmonary arteries are borderline enlarged. Luminal evaluation precluded in the absence of contrast media. Mediastinum/Nodes: No mediastinal fluid or gas. Normal thyroid gland and thoracic inlet. No worrisome mediastinal or axillary adenopathy. Hilar nodal evaluation is limited in the absence of intravenous contrast media. Endotracheal tube terminates in the mid to lower trachea, 3 cm from the carina. Transesophageal tube tip terminates in the gastric body with side port distal to the GE junction. Lungs/Pleura: Heterogeneous dependent areas of mixed ground-glass and dense consolidative opacities are present in both lungs with multiple fluid-filled dependent airways as well as interlobular septal thickening and fissural thickening and small bilateral pleural effusions with further atelectatic changes and volume loss as well. No pneumothorax. Musculoskeletal: Posterior left third through sixth left rib fractures. Posterior right rib 4 rib fractures. No other acute osseous injuries in the chest wall. Asymmetric sternoclavicular arthrosis, increased on the right. Degenerative changes in both shoulders. Multilevel degenerative changes present spine Multilevel flowing anterior osteophytosis, compatible with features of diffuse idiopathic skeletal hyperostosis (DISH). Mild body wall edema. CT ABDOMEN AND PELVIS FINDINGS Hepatobiliary: No visible hepatic injury or concerning liver lesion on this unenhanced CT. No perihepatic hematoma. Smooth surface contour. Normal attenuation. Patient is post cholecystectomy. Slight prominence of the biliary tree likely related to post cholecystectomy reservoir effect without visible calcified intraductal gallstones. Pancreas: Moderate pancreatic atrophy and partial fatty replacement of the pancreas. No ductal dilatation, discernible lesions, or inflammation. Spleen: No direct splenic injury. No perisplenic hematoma. Normal splenic size.  No concerning lesions. Few scattered vascular  calcifications. Adrenals/Urinary Tract: Mild lobular thickening of the adrenal gland could reflect some senescent hyperplasia without concerning nodules. No evidence of adrenal hemorrhage. No direct renal injury or perinephric hematoma. Bilateral renal atrophy with mild symmetric perinephric stranding, possibly related to senescent change or diminished renal function. Few vascular calcifications are present in both kidneys. Collecting system calculus seen in the upper pole right kidney. No obstructive urolithiasis is evident. Hyperdense 18 mm cystic structure arising from the posterior interpolar right kidney (3/72) additional subcentimeter hyperdense cystic structure arising from the upper pole left kidney (3/65). No other concerning renal lesions. Urinary bladder is largely decompressed by Foley catheter at the time of exam and therefore poorly evaluated by CT imaging. Further obscuration by streak artifact from a hip prosthesis. Stomach/Bowel: Transesophageal tube in appropriate position terminating in the gastric body with side port distal to the GE junction as detailed above. Mild tenting of the anterior stomach is a typical finding for placement of a transesophageal tube. Small duodenal lipoma. Small distal duodenal diverticulum (3/69). No small bowel thickening or dilatation. Mild fecalization of the distal small bowel contents can reflect some slowed intestinal transit Appendix is not visualized. No focal inflammation the vicinity of the cecum to suggest an occult appendicitis. No colonic dilatation or wall thickening. Vascular/Lymphatic: Extensive atherosclerotic calcification throughout the abdominal aorta and branch vessels. Luminal evaluation precluded in the absence of contrast media. No aneurysm or ectasia. No periaortic stranding or hemorrhage. No suspicious or enlarged lymph nodes in the included lymphatic chains. Reproductive: In prostate traversed by the Foley catheter. Coarse eccentric  calcification of the prostate. No concerning abnormalities of the prostate or seminal vesicles. Nonspecific penile calcifications are noted. Other: No abdominopelvic free air or fluid. No bowel containing hernia. Small fat containing umbilical hernia. No traumatic abdominal wall dehiscence. No body wall or retroperitoneal hematoma. Musculoskeletal: The osseous structures appear diffusely demineralized which may limit detection of small or nondisplaced fractures. No acute osseous abnormality or suspicious osseous lesion. Extensive streak artifact related to patient's right hip arthroplasty. Multilevel degenerative changes are present in the imaged portions of the spine. Additional degenerative changes in the hips and pelvis. No acute osseous abnormality of the included upper extremities. IMPRESSION: CT HEAD: 1. No acute intracranial abnormality. 2. Stable parenchymal volume loss and chronic microvascular ischemic changes. 3. Debris in the external auditory canals. CT CERVICAL SPINE: 1. No acute cervical spine fracture or traumatic listhesis. 2. Multilevel degenerative changes of the cervical spine, as described above. CT CHEST, ABDOMEN AND PELVIS 1. Unenhanced CT was performed per clinician order. Lack of IV contrast limits sensitivity and specificity, especially for evaluation of the vasculature and abdominal/pelvic solid viscera. 2. Posterior left third through sixth rib fractures. Posterior right rib 4 rib fractures. Can be seen in the setting of resuscitative efforts. No pneumothorax. 3. Heterogeneous largely dependent areas of mixed ground-glass and dense consolidation in both lungs with fluid-filled dependent airways as well as septal and fissural thickening, and small bilateral pleural effusions as well as features of volume loss. Such findings are worrisome for sequela of aspiration likely with superimposed pulmonary edema and volume loss. Mild contusive changes may also be present in the setting of  resuscitation. 4. Endotracheal tube in the mid to lower trachea, 3 cm from the carina. 5. Transesophageal tube in appropriate position terminating in the gastric body with side port distal to the GE junction. 6. No other acute abdominopelvic abnormalities. 7. Nonobstructing right nephrolithiasis. 8. Hyperdense cystic  structures arising from the lateral right interpolar kidney and upper pole left kidney., possible hyperdense exophytic cyst. Recommend outpatient renal ultrasound. 9. Aortic Atherosclerosis (ICD10-I70.0). Electronically Signed   By: Lovena Le M.D.   On: 11/30/2019 02:57   Portable Chest xray  Result Date: 12/01/2019 CLINICAL DATA:  Acute respiratory failure EXAM: PORTABLE CHEST 1 VIEW COMPARISON:  Chest x-rays dated 11/30/2019 and 09/16/2019. FINDINGS: Heart size and mediastinal contours are stable. Endotracheal tube has been placed with tip adequately positioned approximately 4 cm above the carina. Enteric tube in place. Pacer pad overlies the LEFT heart. Central pulmonary vascular congestion and probable interstitial edema. No evidence of pneumonia. No pleural effusion or pneumothorax is seen. IMPRESSION: 1. Endotracheal tube adequately positioned with tip approximately 4 cm above the carina. 2. Probable mild interstitial edema. Electronically Signed   By: Franki Cabot M.D.   On: 12/01/2019 06:05   DG Chest Portable 1 View  Result Date: 11/30/2019 CLINICAL DATA:  Shortness of breath EXAM: PORTABLE CHEST 1 VIEW COMPARISON:  09/16/2019 FINDINGS: Mild cardiomegaly. Diffuse interstitial opacity. No pleural effusion or pneumothorax. No focal airspace consolidation. IMPRESSION: Cardiomegaly and mild pulmonary edema. Electronically Signed   By: Ulyses Jarred M.D.   On: 11/30/2019 01:55   ECHOCARDIOGRAM COMPLETE  Result Date: 11/30/2019    ECHOCARDIOGRAM REPORT   Patient Name:   [name] Date of Exam: 11/30/2019 Medical Rec #:  [id number]             Height:       67.0 in Accession #:     [id number]            Weight:       179.5 lb Date of Birth:  Jan 22, 1935            BSA:          1.931 m Patient Age:    85 years              BP:           120/64 mmHg Patient Gender: M                     HR:           63 bpm. Exam Location:  [facility] Procedure: 2D Echo, Cardiac Doppler and Color Doppler Indications:     Abnormal ECG 794.31  History:         Patient has prior history of Echocardiogram examinations, most                  recent 06/25/2019. CHF, Arrythmias:Atrial Fibrillation; Risk                  Factors:Hypertension and Diabetes. PVD.  Sonographer:     Sherrie Sport RDCS (AE) Referring Phys:  [id number] Bradly Bienenstock Diagnosing Phys: Ida Rogue MD  Sonographer Comments: Technically difficult study due to poor echo windows, no apical window, no parasternal window and echo performed with patient supine and on artificial respirator. IMPRESSIONS  1. Left ventricular ejection fraction, by estimation, is 50 to 55%. The left ventricle has low normal function. Unable to exclude regional wall motion abnormality.There is mild left ventricular hypertrophy. Left ventricular diastolic parameters are indeterminate.  2. Right ventricular systolic function was not well visualized. The right ventricular size is normal.  3. Challenging image quality FINDINGS  Left Ventricle: Left ventricular ejection fraction, by estimation, is 50 to 55%. The left ventricle has low normal function. The  left ventricle has no regional wall motion abnormalities. The left ventricular internal cavity size was normal in size. There is mild left ventricular hypertrophy. Left ventricular diastolic parameters are indeterminate. Right Ventricle: The right ventricular size is normal. No increase in right ventricular wall thickness. Right ventricular systolic function was not well visualized. Left Atrium: Left atrial size was not well visualized. Right Atrium: Right atrial size was not well visualized. Pericardium: There is no evidence of  pericardial effusion. Mitral Valve: The mitral valve was not well visualized. Normal mobility of the mitral valve leaflets. No evidence of mitral valve regurgitation. No evidence of mitral valve stenosis. Tricuspid Valve: The tricuspid valve is not well visualized. Tricuspid valve regurgitation is not demonstrated. No evidence of tricuspid stenosis. Aortic Valve: The aortic valve was not well visualized. Aortic valve regurgitation is not visualized. No aortic stenosis is present. Pulmonic Valve: The pulmonic valve was not well visualized. Pulmonic valve regurgitation is not visualized. No evidence of pulmonic stenosis. Aorta: The aortic root was not well visualized and the ascending aorta was not well visualized. Venous: The pulmonary veins were not well visualized. The inferior vena cava is normal in size with greater than 50% respiratory variability, suggesting right atrial pressure of 3 mmHg. IAS/Shunts: No atrial level shunt detected by color flow Doppler.  LEFT VENTRICLE PLAX 2D LVIDd:         4.95 cm LVIDs:         3.32 cm LV PW:         1.61 cm LV IVS:        1.49 cm  LEFT ATRIUM         Index LA diam:    4.80 cm 2.49 cm/m  PULMONIC VALVE PV Vmax:        0.86 m/s PV Peak grad:   3.0 mmHg RVOT Peak grad: 4 mmHg  Ida Rogue MD Electronically signed by Ida Rogue MD Signature Date/Time: 11/30/2019/6:57:51 PM    Final     Medications:  I have reviewed the patient's current medications. Scheduled: . chlorhexidine gluconate (MEDLINE KIT)  15 mL Mouth Rinse BID  . Chlorhexidine Gluconate Cloth  6 each Topical Daily  . docusate  100 mg Oral BID  . free water  200 mL Per Tube Q4H  . insulin aspart  0-15 Units Subcutaneous Q4H  . insulin glargine  12 Units Subcutaneous QHS  . mouth rinse  15 mL Mouth Rinse 10 times per day  . polyethylene glycol  17 g Oral Daily  . rivaroxaban  15 mg Oral Q supper   Continuous: . sodium chloride 75 mL/hr at 11/30/19 1119  . albumin human 12.5 g (12/01/19 1025)   . ampicillin-sulbactam (UNASYN) IV 3 g (12/01/19 0547)  . dextrose 5 % and 0.45% NaCl 75 mL/hr at 11/30/19 1800  . famotidine (PEPCID) IV 20 mg (12/01/19 0948)  . levETIRAcetam Stopped (12/01/19 0948)  . propofol (DIPRIVAN) infusion 30 mcg/kg/min (11/30/19 2204)  . vasopressin      Assessment/Plan: 85 y.o. male with a history of A. fib on Xarelto, diastolic CHF, CKD, diabetes, bilateral BKA's, peripheral vascular disease, sleep apnea who presents for hyperglycemia and syncope. Had witnessed seizure receiving 48m of Ativan.  Currently intubated and sedated.  Patient with no documented history of seizures.  Suspect seizure activity was provoked due decreased seizure threshold in the setting of prior stroke with current multiple medical issues including metabolic abnormalities, infection and hyperglycemia.  Head CT personally reviewed and shows no acute changes.  EEG shows a burst suppression rhythm consistent with medications.  Patient starting to show evidence of responding.  Keppra discontinued.    Recommendations: 1. Seizure precautions 2. Will continue to follow with you.  If does not continue to show signs of improvement, MRI will be indicated at that time.     LOS: 1 day   Alexis Goodell, MD Neurology [phone] 12/01/2019  11:59 AM

## 2019-12-02 LAB — COMPREHENSIVE METABOLIC PANEL
ALT: 24 U/L (ref 0–44)
AST: 24 U/L (ref 15–41)
Albumin: 2.6 g/dL — ABNORMAL LOW (ref 3.5–5.0)
Alkaline Phosphatase: 84 U/L (ref 38–126)
Anion gap: 8 (ref 5–15)
BUN: 28 mg/dL — ABNORMAL HIGH (ref 8–23)
CO2: 26 mmol/L (ref 22–32)
Calcium: 8.6 mg/dL — ABNORMAL LOW (ref 8.9–10.3)
Chloride: 105 mmol/L (ref 98–111)
Creatinine, Ser: 1.56 mg/dL — ABNORMAL HIGH (ref 0.61–1.24)
GFR calc Af Amer: 47 mL/min — ABNORMAL LOW (ref >60)
GFR calc non Af Amer: 41 mL/min — ABNORMAL LOW (ref >60)
Glucose, Bld: 125 mg/dL — ABNORMAL HIGH (ref 70–99)
Potassium: 3.4 mmol/L — ABNORMAL LOW (ref 3.5–5.1)
Sodium: 139 mmol/L (ref 135–145)
Total Bilirubin: 1 mg/dL (ref 0.3–1.2)
Total Protein: 5.3 g/dL — ABNORMAL LOW (ref 6.5–8.1)

## 2019-12-02 LAB — GLUCOSE, CAPILLARY
Glucose-Capillary: 117 mg/dL — ABNORMAL HIGH (ref 70–99)
Glucose-Capillary: 124 mg/dL — ABNORMAL HIGH (ref 70–99)
Glucose-Capillary: 128 mg/dL — ABNORMAL HIGH (ref 70–99)
Glucose-Capillary: 165 mg/dL — ABNORMAL HIGH (ref 70–99)
Glucose-Capillary: 179 mg/dL — ABNORMAL HIGH (ref 70–99)
Glucose-Capillary: 194 mg/dL — ABNORMAL HIGH (ref 70–99)

## 2019-12-02 LAB — BLOOD GAS, ARTERIAL
Acid-base deficit: 6 mmol/L — ABNORMAL HIGH (ref 0.0–2.0)
Bicarbonate: 17.7 mmol/L — ABNORMAL LOW (ref 20.0–28.0)
FIO2: 0.4
MECHVT: 430 mL
Mechanical Rate: 22
O2 Saturation: 92 %
PEEP: 5 cmH2O
Patient temperature: 37
RATE: 22 resp/min
pCO2 arterial: 30 mmHg — ABNORMAL LOW (ref 32.0–48.0)
pH, Arterial: 7.38 (ref 7.350–7.450)
pO2, Arterial: 65 mmHg — ABNORMAL LOW (ref 83.0–108.0)

## 2019-12-02 LAB — CULTURE, RESPIRATORY W GRAM STAIN: Culture: NORMAL

## 2019-12-02 LAB — CBC WITH DIFFERENTIAL/PLATELET
Abs Immature Granulocytes: 0.08 10*3/uL — ABNORMAL HIGH (ref 0.00–0.07)
Basophils Absolute: 0.1 10*3/uL (ref 0.0–0.1)
Basophils Relative: 1 %
Eosinophils Absolute: 0.5 10*3/uL (ref 0.0–0.5)
Eosinophils Relative: 4 %
HCT: 35.7 % — ABNORMAL LOW (ref 39.0–52.0)
Hemoglobin: 12.3 g/dL — ABNORMAL LOW (ref 13.0–17.0)
Immature Granulocytes: 1 %
Lymphocytes Relative: 11 %
Lymphs Abs: 1.3 10*3/uL (ref 0.7–4.0)
MCH: 29.7 pg (ref 26.0–34.0)
MCHC: 34.5 g/dL (ref 30.0–36.0)
MCV: 86.2 fL (ref 80.0–100.0)
Monocytes Absolute: 1 10*3/uL (ref 0.1–1.0)
Monocytes Relative: 9 %
Neutro Abs: 8.8 10*3/uL — ABNORMAL HIGH (ref 1.7–7.7)
Neutrophils Relative %: 74 %
Platelets: 197 10*3/uL (ref 150–400)
RBC: 4.14 MIL/uL — ABNORMAL LOW (ref 4.22–5.81)
RDW: 14.5 % (ref 11.5–15.5)
WBC: 11.8 10*3/uL — ABNORMAL HIGH (ref 4.0–10.5)
nRBC: 0 % (ref 0.0–0.2)

## 2019-12-02 LAB — MAGNESIUM: Magnesium: 1.9 mg/dL (ref 1.7–2.4)

## 2019-12-02 LAB — PROCALCITONIN: Procalcitonin: 0.8 ng/mL

## 2019-12-02 LAB — PHOSPHORUS: Phosphorus: 2.9 mg/dL (ref 2.5–4.6)

## 2019-12-02 MED ORDER — METOPROLOL TARTRATE 25 MG PO TABS
12.5000 mg | ORAL_TABLET | ORAL | Status: AC
  Administered 2019-12-02: 12.5 mg via ORAL
  Filled 2019-12-02: qty 1

## 2019-12-02 MED ORDER — METOPROLOL TARTRATE 25 MG PO TABS
12.5000 mg | ORAL_TABLET | Freq: Two times a day (BID) | ORAL | Status: DC
  Administered 2019-12-02 – 2019-12-04 (×5): 12.5 mg via ORAL
  Filled 2019-12-02 (×5): qty 1

## 2019-12-02 MED ORDER — MAGNESIUM SULFATE 2 GM/50ML IV SOLN
2.0000 g | Freq: Once | INTRAVENOUS | Status: AC
  Administered 2019-12-02: 2 g via INTRAVENOUS
  Filled 2019-12-02: qty 50

## 2019-12-02 MED ORDER — METOPROLOL TARTRATE 5 MG/5ML IV SOLN
5.0000 mg | INTRAVENOUS | Status: AC
  Administered 2019-12-02: 5 mg via INTRAVENOUS
  Filled 2019-12-02: qty 5

## 2019-12-02 MED ORDER — HYDRALAZINE HCL 20 MG/ML IJ SOLN
10.0000 mg | Freq: Once | INTRAMUSCULAR | Status: AC
  Administered 2019-12-02: 10 mg via INTRAVENOUS
  Filled 2019-12-02: qty 1

## 2019-12-02 MED ORDER — POTASSIUM CHLORIDE 20 MEQ/15ML (10%) PO SOLN
40.0000 meq | Freq: Once | ORAL | Status: AC
  Administered 2019-12-02: 40 meq
  Filled 2019-12-02: qty 30

## 2019-12-02 MED FILL — Medication: Qty: 1 | Status: AC

## 2019-12-02 NOTE — Progress Notes (Signed)
Subjective: Starting to wean sedation and patient becoming more responsive  Objective: Current vital signs: BP (!) 172/72   Pulse 97   Temp 98.4 F (36.9 C)   Resp (!) 28   Ht _0  (1.702 m)   Wt 83.4 kg   SpO2 92%   BMI 28.80 kg/m  Vital signs in last 24 hours: Temp:  [97.9 F (36.6 C)-99.1 F (37.3 C)] 98.4 F (36.9 C) (07/11 0900) Pulse Rate:  [35-97] 97 (07/11 0900) Resp:  [22-28] 28 (07/11 0900) BP: (106-233)/(49-93) 172/72 (07/11 0941) SpO2:  [92 %-100 %] 92 % (07/11 0919) FiO2 (%):  [30 %-35 %] 30 % (07/11 0919) Weight:  [83.4 kg] 83.4 kg (07/11 0500)  Intake/Output from previous day: 07/10 0701 - 07/11 0700 In: 1509.3 [I.V.:509.1; IV Piggyback:1000.2] Out: 1400 [Urine:1400] Intake/Output this shift: No intake/output data recorded. Nutritional status:  Diet Order            Diet NPO time specified  Diet effective now                 Neurologic Exam: Mental Status: Patient does not respond to verbal stimuli.  Grimaces to deep sternal rub and attempts to open eyes.  Attempts to localize to pain.  Does not follow commands.  No verbalizations noted.  Cranial Nerves: II: patient does not respond confrontation bilaterally, pupils right 3 mm, left 3 mm,and reactive bilaterally III,IV,VI: Oculocephalic response absent bilaterally.  V,VII: corneal reflex present bilaterally  VIII: patient does not respond to verbal stimuli IX,X: gag reflex reduced, XI: trapezius strength unable to test bilaterally XII: tongue strength unable to test Motor: Attempts to localize to pain with the upper extremities.   Sensory: Grimaces to noxious stimuli in all extremities.  Lab Results: Basic Metabolic Panel: Recent Labs  Lab 11/30/19 0109 11/30/19 0109 11/30/19 0533 11/30/19 0533 11/30/19 7035 11/30/19 0841 11/30/19 1306 12/01/19 0422 12/02/19 0415  NA 137   < > 141  --  009  --  DUPLICATE REQUEST  381 829 139  K 4.0   < > 3.7  --  4.0  --  DUPLICATE REQUEST  4.4  3.9 3.4*  CL 99   < > 103  --  937  --  DUPLICATE REQUEST  169 678 105  CO2 22   < > 24  --  24  --  DUPLICATE REQUEST  <LFYBOFBPZWCHENID>_7<\/OEUMPNTIRWERXVQM>_0 GLUCOSE 679*   < > 559*  --  867*  --  DUPLICATE REQUEST  619* 185* 125*  BUN 28*   < > 29*  --  30*  --  DUPLICATE REQUEST  32* 32* 28*  CREATININE 1.72*   < > 1.90*  --  5.09*  --  DUPLICATE REQUEST  3.26* 1.52* 1.56*  CALCIUM 10.5*   < > 11.9*   < > 71.2*   < > DUPLICATE REQUEST  45.8 9.4 8.6*  MG 2.0  --   --   --   --   --   --  1.9 1.9  PHOS  --   --   --   --   --   --   --   --  2.9   < > = values in this interval not displayed.    Liver Function Tests: Recent Labs  Lab 11/30/19 0109 12/01/19 0422 12/02/19 0415  AST 43* 26 24  ALT _2 ALKPHOS 172* 81 84  BILITOT 1.3* 0.9 1.0  PROT 7.9 5.6*  5.3*  ALBUMIN 3.5 2.5* 2.6*   No results for input(s): LIPASE, AMYLASE in the last 168 hours. No results for input(s): AMMONIA in the last 168 hours.  CBC: Recent Labs  Lab 11/30/19 0109 12/01/19 0612 12/02/19 0415  WBC 21.8* 17.2* 11.8*  NEUTROABS 15.1* 14.1* 8.8*  HGB 17.6* 12.7* 12.3*  HCT 52.3* 36.9* 35.7*  MCV 86.9 85.8 86.2  PLT 241 204 197    Cardiac Enzymes: No results for input(s): CKTOTAL, CKMB, CKMBINDEX, TROPONINI in the last 168 hours.  Lipid Panel: Recent Labs  Lab 11/30/19 0325 11/30/19 0841  CHOL  --  253*  TRIG 174*  --     CBG: Recent Labs  Lab 12/01/19 1153 12/01/19 1607 12/01/19 1939 12/02/19 0428 12/02/19 0756  GLUCAP 153* 132* 137* 117* 19*    Microbiology: Results for orders placed or performed during the hospital encounter of 11/30/19  SARS Coronavirus 2 by RT PCR (hospital order, performed in [facility] hospital lab) Nasopharyngeal Nasopharyngeal Swab     Status: None   Collection Time: 11/30/19  2:54 AM   Specimen: Nasopharyngeal Swab  Result Value Ref Range Status   SARS Coronavirus 2 NEGATIVE NEGATIVE Final    Comment: (NOTE) SARS-CoV-2 target nucleic acids are NOT DETECTED.  The  SARS-CoV-2 RNA is generally detectable in upper and lower respiratory specimens during the acute phase of infection. The lowest concentration of SARS-CoV-2 viral copies this assay can detect is 250 copies / mL. A negative result does not preclude SARS-CoV-2 infection and should not be used as the sole basis for treatment or other patient management decisions.  A negative result may occur with improper specimen collection / handling, submission of specimen other than nasopharyngeal swab, presence of viral mutation(s) within the areas targeted by this assay, and inadequate number of viral copies (<250 copies / mL). A negative result must be combined with clinical observations, patient history, and epidemiological information.  Fact Sheet for Patients:   StrictlyIdeas.no  Fact Sheet for Healthcare Providers: BankingDealers.co.za  This test is not yet approved or  cleared by the [place] FDA and has been authorized for detection and/or diagnosis of SARS-CoV-2 by FDA under an Emergency Use Authorization (EUA).  This EUA will remain in effect (meaning this test can be used) for the duration of the COVID-19 declaration under Section 564(b)(1) of the Act, 21 U.S.C. section 360bbb-3(b)(1), unless the authorization is terminated or revoked sooner.  Performed at [facility], [address] [place], [place] [address]   MRSA PCR Screening     Status: None   Collection Time: 11/30/19  4:44 AM   Specimen: Nasopharyngeal  Result Value Ref Range Status   MRSA by PCR NEGATIVE NEGATIVE Final    Comment:        The GeneXpert MRSA Assay (FDA approved for NASAL specimens only), is one component of a comprehensive MRSA colonization surveillance program. It is not intended to diagnose MRSA infection nor to guide or monitor treatment for MRSA infections. Performed at [facility], [address] [place], [place] [address]    CULTURE, BLOOD (ROUTINE X 2) w Reflex to ID Panel     Status: None (Preliminary result)   Collection Time: 11/30/19  5:33 AM   Specimen: Left Antecubital; Blood  Result Value Ref Range Status   Specimen Description LEFT ANTECUBITAL  Final   Special Requests   Final    BOTTLES DRAWN AEROBIC AND ANAEROBIC Blood Culture adequate volume   Culture   Final  NO GROWTH 2 DAYS Performed at [facility], [address] [place], [place] [address]    Report Status PENDING  Incomplete  CULTURE, BLOOD (ROUTINE X 2) w Reflex to ID Panel     Status: None (Preliminary result)   Collection Time: 11/30/19  5:42 AM   Specimen: BLOOD LEFT FOREARM  Result Value Ref Range Status   Specimen Description BLOOD LEFT FOREARM  Final   Special Requests   Final    BOTTLES DRAWN AEROBIC AND ANAEROBIC Blood Culture adequate volume   Culture   Final    NO GROWTH 2 DAYS Performed at [facility], [address] [place], [place] [address]    Report Status PENDING  Incomplete  Culture, respiratory     Status: None (Preliminary result)   Collection Time: 11/30/19 11:18 AM   Specimen: Tracheal Aspirate; Respiratory  Result Value Ref Range Status   Specimen Description   Final    TRACHEAL ASPIRATE Performed at [facility], [address] [place], [place] [address]    Special Requests   Final    NONE Performed at [facility], [address] [place], [place] [address]    Gram Stain   Final    FEW WBC PRESENT, PREDOMINANTLY PMN FEW GRAM POSITIVE COCCI RARE YEAST    Culture   Final    FEW Consistent with normal respiratory flora. Performed at [facility], [place] [address], [place], [place] [address]    Report Status PENDING  Incomplete    Coagulation Studies: Recent Labs    11/30/19 0109  LABPROT 13.9  INR 1.1    Imaging: EEG  Result Date: 11/30/2019 Alexis Goodell, MD     11/30/2019  4:20 PM ELECTROENCEPHALOGRAM REPORT Patient: [name]       Room #: IC06A-AA EEG No. ID: [id number] Age: 85 y.o.        Sex: male Requesting Physician: Lanney Gins Report Date:  11/30/2019       Interpreting Physician: Alexis Goodell History: Jerric Oyen is an 85 y.o. male s/p arrest and seizure Medications: Keppra, Unasyn, Insulin, Propofol, Vasopressin Conditions of Recording:  This is a 21 channel routine scalp EEG performed with bipolar and monopolar montages arranged in accordance to the international 10/20 system of electrode placement. One channel was dedicated to EKG recording. The patient is in the intubated and sedated state. Description:  The background activity is discontinuous. It consists of low voltage bursts of polyspike, spike and slow wave activity alternating with periods of attenuation. The burst activity lasts up to 4 seconds.  The periods of attenuation last up to 6 seconds. This discontinuous activity is maintained throughout the tracing. No activation procedures were performed.  Hyperventilation and intermittent photic stimulation were not performed. IMPRESSION: This is an abnormal EEG due to a burst-suppression pattern seen throughout the tracing.  Burst suppression pattern can be seen in a variety of circumstances, including anesthesia, drug intoxication, hypothermia, as well as cerebral anoxia.  Clinical/neurological, and radiographic correlation advised.  Alexis Goodell, MD Neurology [phone] 11/30/2019, 4:16 PM   Portable Chest xray  Result Date: 12/01/2019 CLINICAL DATA:  Acute respiratory failure EXAM: PORTABLE CHEST 1 VIEW COMPARISON:  Chest x-rays dated 11/30/2019 and 09/16/2019. FINDINGS: Heart size and mediastinal contours are stable. Endotracheal tube has been placed with tip adequately positioned approximately 4 cm above the carina. Enteric tube in place. Pacer pad overlies the LEFT heart. Central pulmonary vascular congestion and probable interstitial edema. No evidence  of pneumonia. No pleural effusion or  pneumothorax is seen. IMPRESSION: 1. Endotracheal tube adequately positioned with tip approximately 4 cm above the carina. 2. Probable mild interstitial edema. Electronically Signed   By: Franki Cabot M.D.   On: 12/01/2019 06:05   ECHOCARDIOGRAM COMPLETE  Result Date: 11/30/2019    ECHOCARDIOGRAM REPORT   Patient Name:   [name] Date of Exam: 11/30/2019 Medical Rec #:  [id number]             Height:       67.0 in Accession #:    [id number]            Weight:       179.5 lb Date of Birth:  Apr 10, 1935            BSA:          1.931 m Patient Age:    41 years              BP:           120/64 mmHg Patient Gender: M                     HR:           63 bpm. Exam Location:  [facility] Procedure: 2D Echo, Cardiac Doppler and Color Doppler Indications:     Abnormal ECG 794.31  History:         Patient has prior history of Echocardiogram examinations, most                  recent 06/25/2019. CHF, Arrythmias:Atrial Fibrillation; Risk                  Factors:Hypertension and Diabetes. PVD.  Sonographer:     Sherrie Sport RDCS (AE) Referring Phys:  [id number] Bradly Bienenstock Diagnosing Phys: Ida Rogue MD  Sonographer Comments: Technically difficult study due to poor echo windows, no apical window, no parasternal window and echo performed with patient supine and on artificial respirator. IMPRESSIONS  1. Left ventricular ejection fraction, by estimation, is 50 to 55%. The left ventricle has low normal function. Unable to exclude regional wall motion abnormality.There is mild left ventricular hypertrophy. Left ventricular diastolic parameters are indeterminate.  2. Right ventricular systolic function was not well visualized. The right ventricular size is normal.  3. Challenging image quality FINDINGS  Left Ventricle: Left ventricular ejection fraction, by estimation, is 50 to 55%. The left ventricle has low normal function. The left ventricle has no regional wall motion abnormalities. The left ventricular internal cavity  size was normal in size. There is mild left ventricular hypertrophy. Left ventricular diastolic parameters are indeterminate. Right Ventricle: The right ventricular size is normal. No increase in right ventricular wall thickness. Right ventricular systolic function was not well visualized. Left Atrium: Left atrial size was not well visualized. Right Atrium: Right atrial size was not well visualized. Pericardium: There is no evidence of pericardial effusion. Mitral Valve: The mitral valve was not well visualized. Normal mobility of the mitral valve leaflets. No evidence of mitral valve regurgitation. No evidence of mitral valve stenosis. Tricuspid Valve: The tricuspid valve is not well visualized. Tricuspid valve regurgitation is not demonstrated. No evidence of tricuspid stenosis. Aortic Valve: The aortic valve was not well visualized. Aortic valve regurgitation is not visualized. No aortic stenosis is present. Pulmonic Valve: The pulmonic valve was not well visualized. Pulmonic valve regurgitation is not visualized. No evidence of pulmonic stenosis. Aorta: The  aortic root was not well visualized and the ascending aorta was not well visualized. Venous: The pulmonary veins were not well visualized. The inferior vena cava is normal in size with greater than 50% respiratory variability, suggesting right atrial pressure of 3 mmHg. IAS/Shunts: No atrial level shunt detected by color flow Doppler.  LEFT VENTRICLE PLAX 2D LVIDd:         4.95 cm LVIDs:         3.32 cm LV PW:         1.61 cm LV IVS:        1.49 cm  LEFT ATRIUM         Index LA diam:    4.80 cm 2.49 cm/m  PULMONIC VALVE PV Vmax:        0.86 m/s PV Peak grad:   3.0 mmHg RVOT Peak grad: 4 mmHg  Ida Rogue MD Electronically signed by Ida Rogue MD Signature Date/Time: 11/30/2019/6:57:51 PM    Final     Medications:  I have reviewed the patient's current medications. Scheduled: . chlorhexidine gluconate (MEDLINE KIT)  15 mL Mouth Rinse BID  .  Chlorhexidine Gluconate Cloth  6 each Topical Daily  . docusate  100 mg Oral BID  . free water  200 mL Per Tube Q4H  . insulin aspart  0-15 Units Subcutaneous Q4H  . insulin glargine  12 Units Subcutaneous QHS  . mouth rinse  15 mL Mouth Rinse 10 times per day  . polyethylene glycol  17 g Oral Daily  . rivaroxaban  15 mg Oral Q supper    Assessment/Plan: 84 y.o.malewith a history of A. fib on Xarelto, diastolic CHF, CKD, diabetes, bilateral BKA's, peripheral vascular disease, sleep apnea who presents for hyperglycemia and syncope. Had witnessed seizure receiving 56m of Ativan. Currently intubated and sedated. Patient with no documented history of seizures. Suspect seizure activity was provoked due decreased seizure threshold in the setting of prior stroke with current multiple medical issues including metabolic abnormalities, infection and hyperglycemia. Head CT personally reviewed and shows no acute changes. EEG shows a burst suppression rhythm consistent with medications.  Patient continuing to show evidence of improvement.  Keppra discontinued.    Recommendations: 1. Seizure precautions 2. Will continue to follow with you.  If does not continue to show signs of improvement, MRI will be indicated at that time.      LOS: 2 days   LAlexis Goodell MD Neurology [phone]/03/2020  10:27 AM

## 2019-12-02 NOTE — Progress Notes (Signed)
PULMONARY / CRITICAL CARE MEDICINE   Name: [name] MRN: [id number] DOB: Jul 14, 1934    ADMISSION DATE:  11/30/2019 CONSULTATION DATE:  11/30/2019  REFERRING MD:  Dr. Alfred Levins  CHIEF COMPLAINT:  Tonic-Clonic Seizure, Hyperglycemia  BRIEF DISCUSSION: 85 y.o. Male admitted 11/30/19 with Tonic-Clonic seizure, Acute Hypoxic Hypercapnic Respiratory Failure requiring intubation secondary to Aspiration Pneumonia & Pulmonary Edema, A-fib w/ RVR, intermittent nonsustained V-tach, questionable NSTEMI, and HHS.  Cardiology and Neurology are consulted.  HISTORY OF PRESENT ILLNESS:   [name] is a 85 year old male with a past medical history significant for atrial fibrillation on Xarelto, HFpEF, CKD, diabetes mellitus, bilateral BKA's, PVD, sleep apnea, and COVID-19 infection in September 2020 who presents to [facility] ED on 11/30/2019 due to syncope and hyperglycemia.  Patient is currently intubated and sedated with no family present, therefore history is obtained from ED and nursing notes.  Per notes patient had a syncopal episode this evening, and when EMS arrived they found him face down on the floor very confused (he was noted to have a pulse and spontaneous breathing).  CBG was too high to read.  Patient was combative of which he was given 2 mg of IV Versed.  While in route to the hospital with EMS he went into V. tach and received 150 mg of amiodarone.  Upon arrival to the ED he was very agitated, not answering questions, with increased work of breathing and gurgling respirations.  EKG with atrial fibrillation with diffuse deep ST depressions and elevations and the anterior leads, with intermittent runs of V. tach despite receiving 150 mg of amiodarone in route to the hospital with EMS.  ED provider consulted with Dr. Fletcher Anon of Cardiology who evaluated the EKG and determined the patient did not meet STEMI criteria.  Cardiology recommended starting patient on heparin and continuing amiodarone drip.  Patient was noted to hypoxic with bilateral crackles for which he was subsequently placed on BiPAP.  He was noted to have a  witnessed generalized tonic-clonic seizure which he was given 2 mg of IV Ativan and was intubated for airway protection (noted to have some vomit in his posterior oropharynx during intubation concerning for aspiration).    CT of the head was negative for any acute intracranial process, CT cervical spine is negative, and CT chest/abdomen/pelvis with posterior left rib fractures (no Pneumothorax), aspiration pneumonia, pulmonary edema, good positioning of the ETT, and non-obstructing right Neprholithiasis.  Patient again developed A. fib with RVR which he received IV metoprolol. Lab work shows glucose 679, BUN 28, Creatinine 1.72, anion gap 16, BNP 390, high sensitivity troponin 39, lactic acid 7.1, WBC 21.8 (with Neutrophilia).  His COVID-19 PCR is negative. Urinalysis negative for ketones and negative for UTI.  Given his severely elevated glucose he was placed on insulin drip.  PCCM is asked to admit the pt to ICU for further workup and treatment of Tonic-Clonic seizure, Acute Hypoxic Hypercapnic Respiratory Failure secondary to Aspiration Pneumonia & Pulmonary Edema, A-fib w/ RVR, intermittent nonsustained V-tach, and questionable NSTEMI.  Cardiology and Neurology are consulted.     11/30/19- Patient remains on MV weaning FiO2 to 80%, s/p TTE this AM, plan for EEG tonight. Discussed short term care plan with neurology Dr Doy Mince.  Tracheal aspirate with thick solid chunks of discolored material concerning for aspiration.   12/01/19- Patient improved , FiO2 50% 12/02/19 - patient is further weaned on FIO2 , he did not tolerate SBT, had sustained VT today with pulse responsive to beta blockade.  Will re-attempt SBT tommorow   PAST MEDICAL HISTORY :  He  has a past medical history of Arthritis, Atrial fibrillation ([place]), Carcinoma of prostate ([place]), CHF (congestive heart failure)  ([place]), Chronic kidney disease, Diabetes mellitus without complication ([place]), ED (erectile dysfunction), Frequent urination, Hyperlipidemia, Hypertension, Moderate mitral insufficiency, Peripheral vascular disease ([place]), Pneumonia (04/2016), Prostate cancer [facility]), Sleep apnea, Ulcer of left foot due to type 2 diabetes mellitus ([place]), and Urinary stress incontinence, male.  PAST SURGICAL HISTORY: He  has a past surgical history that includes Prostate surgery; Tonsillectomy; Eye surgery (Bilateral); Cholecystectomy; Lower Extremity Angiography (Left, 08/16/2016); Lower Extremity Angiography (Left, 09/01/2016); LOWER EXTREMITY INTERVENTION (09/01/2016); Irrigation and debridement foot (Left, 10/15/2016); Apligraft placement (Left, 10/15/2016); Lower Extremity Angiography (Left, 10/14/2016); Irrigation and debridement foot (Left, 12/09/2016); Lower Extremity Angiography (Left, 12/20/2016); Amputation (Left, 01/12/2017); LEFT HEART CATH AND CORONARY ANGIOGRAPHY (N/A, 06/05/2018); Hip Arthroplasty (Right, 10/13/2018); Lower Extremity Angiography (Right, 12/18/2018); Irrigation and debridement foot (Right, 12/23/2018); Esophagogastroduodenoscopy (egd) with propofol (N/A, 01/02/2019); Colonoscopy with propofol (N/A, 01/02/2019); Colonoscopy with propofol (N/A, 01/05/2019); Amputation (Right, 12/27/2018); and Left Heart Cath (N/A, 06/25/2019).  Allergies  Allergen Reactions  . Contrast Media [Iodinated Diagnostic Agents] Shortness Of Breath    SOB  . Iohexol Shortness Of Breath     Desc: Respiratory Distress, Laryngedema, diaphoresis, Onset Date: [id number]   . Metrizamide Shortness Of Breath    SOB SOB SOB   . Latex Rash    No current facility-administered medications on file prior to encounter.   Current Outpatient Medications on File Prior to Encounter  Medication Sig  . acetaminophen (TYLENOL) 325 MG tablet Take 650 mg by mouth every 6 (six) hours as needed.  Marland Kitchen amiodarone (PACERONE) 200 MG tablet Take 200 mg by mouth  daily.  Marland Kitchen aspirin EC 81 MG tablet Take 1 tablet (81 mg total) by mouth daily.  Marland Kitchen atorvastatin (LIPITOR) 80 MG tablet Take 80 mg by mouth every evening.  . bisacodyl (DULCOLAX) 5 MG EC tablet Take 1 tablet (5 mg total) by mouth daily as needed for moderate constipation.  . chlorthalidone (HYGROTON) 25 MG tablet Take 25 mg by mouth daily.  . Cholecalciferol (VITAMIN D-3) 25 MCG (1000 UT) CAPS Take by mouth.  . collagenase (SANTYL) ointment Apply 1 application topically daily. (Patient not taking: Reported on 06/24/2019)  . diltiazem (CARDIZEM CD) 120 MG 24 hr capsule Take 120 mg by mouth daily.  . divalproex (DEPAKOTE ER) 500 MG 24 hr tablet Take 1 tablet (500 mg total) by mouth daily.  Marland Kitchen doxycycline (VIBRAMYCIN) 100 MG capsule Take 100 mg by mouth 2 (two) times daily.  . Ensure Max Protein (ENSURE MAX PROTEIN) LIQD Take 330 mLs (11 oz total) by mouth 2 (two) times daily.  . ferrous sulfate 325 (65 FE) MG tablet Take 1 tablet (325 mg total) by mouth daily with breakfast.  . gabapentin (NEURONTIN) 300 MG capsule Take 300 mg by mouth 3 (three) times daily.   Marland Kitchen HYDROcodone-acetaminophen (NORCO/VICODIN) 5-325 MG tablet Take 1 tablet by mouth every 6 (six) hours as needed for moderate pain (pain score 4-6). (Patient not taking: Reported on 06/24/2019)  . Insulin Glargine (BASAGLAR KWIKPEN) 100 UNIT/ML SMARTSIG:15 Unit(s) SUB-Q Every Night  . insulin glargine (LANTUS) 100 UNIT/ML injection Inject 0.15 mLs (15 Units total) into the skin at bedtime.  . insulin lispro (HUMALOG) 100 UNIT/ML injection Inject 0.03 mLs (3 Units total) into the skin 3 (three) times daily with meals. (Patient taking differently: Inject 3 Units into the skin 3 (three)  times daily with meals. Plus 1 additional unit for every 50 BG points over 200 (max 5 additional units per dose))  . isosorbide mononitrate (IMDUR) 60 MG 24 hr tablet Take 1 tablet (60 mg total) by mouth daily.  . metFORMIN (GLUCOPHAGE) 500 MG tablet Take by mouth daily.   . metoprolol succinate (TOPROL-XL) 25 MG 24 hr tablet Take 25 mg by mouth daily.  . metoprolol tartrate (LOPRESSOR) 25 MG tablet Take 25 mg by mouth 3 (three) times daily.  . Multiple Vitamin (MULTIVITAMIN WITH MINERALS) TABS tablet Take 1 tablet by mouth daily.  . nitroGLYCERIN (NITROSTAT) 0.4 MG SL tablet Place 1 tablet (0.4 mg total) under the tongue every 5 (five) minutes as needed for chest pain.  Marland Kitchen NOVOLOG FLEXPEN 100 UNIT/ML FlexPen   . potassium chloride (KLOR-CON) 20 MEQ packet Take by mouth 2 (two) times daily.  . Rivaroxaban (XARELTO) 15 MG TABS tablet Take 1 tablet (15 mg total) by mouth daily with supper. Hold Xarelto for 3 days.  Start on January 09, 2019.  . senna (SENOKOT) 8.6 MG TABS tablet Take 1 tablet (8.6 mg total) by mouth daily as needed for mild constipation.  . silver sulfADIAZINE (SILVADENE) 1 % cream Apply 1 application topically daily.  . vitamin C (VITAMIN C) 250 MG tablet Take 1 tablet (250 mg total) by mouth 2 (two) times daily. (Patient not taking: Reported on 06/24/2019)  . zinc sulfate 220 (50 Zn) MG capsule Take 220 mg by mouth daily.    FAMILY HISTORY:  His He indicated that his mother is deceased. He indicated that his father is deceased. He indicated that the status of his neg hx is unknown.   SOCIAL HISTORY: He  reports that he quit smoking about 25 years ago. His smoking use included cigarettes. He smoked 0.25 packs per day. He has never used smokeless tobacco. He reports that he does not drink alcohol and does not use drugs.    REVIEW OF SYSTEMS:   Unable to assess due to intubation and sedation  SUBJECTIVE:  Unable to assess due to intubation and sedation  VITAL SIGNS: BP (!) 190/91   Pulse (!) 103   Temp 98.4 F (36.9 C)   Resp (!) 28   Ht 5\' 7"  (1.702 m)   Wt 83.4 kg   SpO2 95%   BMI 28.80 kg/m   HEMODYNAMICS:    VENTILATOR SETTINGS: Vent Mode: PRVC FiO2 (%):  [30 %-40 %] 40 % Set Rate:  [22 bmp] 22 bmp Vt Set:  [430 mL] 430  mL PEEP:  [5 cmH20] 5 cmH20 Plateau Pressure:  [14 cmH20-15 cmH20] 14 cmH20  INTAKE / OUTPUT: I/O last 3 completed shifts: In: 1509.3 [I.V.:509.1; IV Piggyback:1000.2] Out: 1700 [Urine:1700]  PHYSICAL EXAMINATION: General: Acute on chronically ill-appearing male, laying in bed, intubated and sedated, no acute distressj,GCS4T Neuro: Sedated, withdraws from pain, pupils PERRLA HEENT: Atraumatic, normocephalic, neck supple, no JVD Cardiovascular: Irregular irregular rhythm, no murmurs, rubs, gallops Lungs: Coarse breath sounds bilaterally, even, ventilator assisted Abdomen: Soft, nontender, nondistended, no guarding or rebound tenderness, bowel sounds positive x4 Musculoskeletal: Bilateral BKA's, no edema Skin: Warm and dry.  No obvious rashes, lesions, ulcerations  LABS:  BMET Recent Labs  Lab 11/30/19 1306 12/01/19 0422 03/50/09 3818  NA DUPLICATE REQUEST  [phone]  K DUPLICATE REQUEST  4.4 3.9 3.4*  CL DUPLICATE REQUEST  [phone]  CO2 DUPLICATE REQUEST  25 25 26   BUN DUPLICATE REQUEST  32* 32* 28*  CREATININE DUPLICATE REQUEST  5.73* 1.52* 2.20*  GLUCOSE DUPLICATE REQUEST  254* 185* 125*    Electrolytes Recent Labs  Lab 11/30/19 0109 11/30/19 0533 11/30/19 1306 12/01/19 0422 12/02/19 0415  CALCIUM 27.0*   < > DUPLICATE REQUEST  62.3 9.4 8.6*  MG 2.0  --   --  1.9 1.9  PHOS  --   --   --   --  2.9   < > = values in this interval not displayed.    CBC Recent Labs  Lab 11/30/19 0109 12/01/19 0612 12/02/19 0415  WBC 21.8* 17.2* 11.8*  HGB 17.6* 12.7* 12.3*  HCT 52.3* 36.9* 35.7*  PLT 241 204 197    Coag's Recent Labs  Lab 11/30/19 0109  APTT 25  INR 1.1    Sepsis Markers Recent Labs  Lab 11/30/19 0109 11/30/19 0325 11/30/19 0533 12/01/19 0422 12/02/19 0415  LATICACIDVEN 7.1*  --  5.7*  --   --   PROCALCITON  --  0.20  --  1.55 0.80    ABG Recent Labs  Lab 11/30/19 0314 12/02/19 1048  PHART 7.16* 7.38  PCO2ART 61* 30*   PO2ART 67* 65*    Liver Enzymes Recent Labs  Lab 11/30/19 0109 12/01/19 0422 12/02/19 0415  AST 43* 26 24  ALT 28 23 24   ALKPHOS 172* 81 84  BILITOT 1.3* 0.9 1.0  ALBUMIN 3.5 2.5* 2.6*    Cardiac Enzymes No results for input(s): TROPONINI, PROBNP in the last 168 hours.  Glucose Recent Labs  Lab 12/01/19 0708 12/01/19 1153 12/01/19 1607 12/01/19 1939 12/02/19 0428 12/02/19 0756  GLUCAP 174* 153* 132* 137* 117* 124*    Imaging No results found.   STUDIES:  7/9: CXR>Mild cardiomegaly. Diffuse interstitial opacity. No pleural effusion or pneumothorax. No focal airspace consolidation.>  7/9: CT Head wo Contrast>>1. No acute intracranial abnormality. 2. Stable parenchymal volume loss and chronic microvascular ischemic changes. 3. Debris in the external auditory canals. 7/9: CT Cervical Spine>>1. No acute cervical spine fracture or traumatic listhesis. 2. Multilevel degenerative changes of the cervical spine, as described above. 7/9: CT Chest/Abdomen/Pelvis>>1. Unenhanced CT was performed per clinician order. Lack of IV contrast limits sensitivity and specificity, especially for evaluation of the vasculature and abdominal/pelvic solid viscera. 2. Posterior left third through sixth rib fractures. Posterior right rib 4 rib fractures. Can be seen in the setting of resuscitative efforts. No pneumothorax. 3. Heterogeneous largely dependent areas of mixed ground-glass and dense consolidation in both lungs with fluid-filled dependent airways as well as septal and fissural thickening, and small bilateral pleural effusions as well as features of volume loss. Such findings are worrisome for sequela of aspiration likely with superimposed pulmonary edema and volume loss. Mild contusive changes may also be present in the setting of resuscitation. 4. Endotracheal tube in the mid to lower trachea, 3 cm from the carina. 5. Transesophageal tube in appropriate position terminating  in the gastric body with side port distal to the GE junction. 6. No other acute abdominopelvic abnormalities. 7. Nonobstructing right nephrolithiasis. 8. Hyperdense cystic structures arising from the lateral right interpolar kidney and upper pole left kidney., possible hyperdense exophytic cyst. Recommend outpatient renal ultrasound. 9. Aortic Atherosclerosis  7/9: 2D Echocardiogram>>  CULTURES: SARS-CoV-2 PCR 7/9>>negative Blood 7/9>> Tracheal aspirate 7/9>>  ANTIBIOTICS: Unasyn 7/9>>  SIGNIFICANT EVENTS: 7/9: Presented to ED; required intubation due to aspiration 7/9: PCCM asked to admit to ICU  LINES/TUBES: ETT 7/9>>   ASSESSMENT / PLAN:   Hyperosmolar non-ketotic hyperglycemia-resolved -  Endox2 protocol  - Diabetic coordinator - appreciate input  - judicious use of IVF due to advaced CHF with bilateral infiltrates on 80%fIO2 at this time  - electrolyte repletion - pharmD eval    Aspiration pneumonia  - solid debris aspirated from tracheal via ETT suspicious for food bolus  -continue IV Unasyn   - wean FiO2 as able  - recruitment maneuvers  -repeat CXR in 2d  - PRN albuterol   Generalized seizures  - neurology on case - appreciate input - reivewed with Dr Doy Mince - likely metabolic in etiology  - treat underlying cause - correct electrolytes, remedy AKI, treat HHS  - EEG scheduled   -c/w Keppra IV   Atrial fibrillation with rapid ventricular response  - we need to decrease volume infusion due to CHF  -will transition to xarelto via OGT  - currently rate controlled at 71bpm  -TTE in progress  - cardiology consultation - appreciate input  OSA  - currently on MV   Dehydration   - due to HHS   - corrected Na 156 - will start free h2O via OGT  -continue IVF    Acute on crhonic renal failure - stage 2  -d/c non essential nephrotoxic medications  -judicious IVF    GI/DVT ppx   - famotidine   - heparin >>xarelto  Critical care provider  statement:    Critical care time (minutes):  33   Critical care time was exclusive of:  Separately billable procedures and  treating other patients   Critical care was necessary to treat or prevent imminent or  life-threatening deterioration of the following conditions:  seizure activity, AKI/ckd, aspiration pneumonia, acute hypoxemia, chf, af, multiple comorbid conditions.    Critical care was time spent personally by me on the following  activities:  Development of treatment plan with patient or surrogate,  discussions with consultants, evaluation of patient's response to  treatment, examination of patient, obtaining history from patient or  surrogate, ordering and performing treatments and interventions, ordering  and review of laboratory studies and re-evaluation of patient's condition   I assumed direction of critical care for this patient from another  provider in my specialty: no      Ottie Glazier, M.D.  [facility]ulmonary [facility] [place]

## 2019-12-02 NOTE — Consult Note (Signed)
[facility] for Electrolyte Monitoring and Replacement   Recent Labs: Potassium (mmol/L)  Date Value  12/02/2019 3.4 (L)  01/20/2013 3.5   Magnesium (mg/dL)  Date Value  12/02/2019 1.9   Calcium (mg/dL)  Date Value  12/02/2019 8.6 (L)   Calcium, Total (mg/dL)  Date Value  01/20/2013 9.4   Albumin (g/dL)  Date Value  12/02/2019 2.6 (L)  01/19/2013 3.7   Phosphorus (mg/dL)  Date Value  12/02/2019 2.9   Sodium (mmol/L)  Date Value  12/02/2019 139  01/20/2013 140   Corrected Ca:   Assessment:85 year old male with a past medical history significant for atrial fibrillation on Xarelto, HFpEF, CKD, diabetes mellitus, bilateral BKA's, PVD, sleep apnea, and COVID-19 infection in September 2020 who presents to [facility] ED on 11/30/2019 due to a seizure  Goal of Therapy:  Potassium 4.0 - 5.1 mmol/L Magnesium 2.0 - 2.4 mg/dL All Other Electrolytes WNL  Plan:   2 grams IV magnesium sulfate x 1  KCl 40 mEq by tube x 1  Re-check electrolytes in am 7/12  Dallie Piles ,PharmD Clinical Pharmacist 12/02/2019 7:17 AM

## 2019-12-02 NOTE — Progress Notes (Signed)
No further episodes, rhythm is now NSR.

## 2019-12-02 NOTE — Progress Notes (Signed)
[facility] Encounter Note  Patient: [name] / Admit Date: 11/30/2019 / Date of Encounter: 12/02/2019, 10:17 AM   Subjective: No apparent significant change in current condition of the patient. Patient still intubated and oxygenating well with respirator. No further seizure activity apparent. Troponin level 39 most consistent with demand ischemia rather than acute coronary syndrome. Patient has had no further rhythm disturbances after intravenous amiodarone has converted patient to normal sinus rhythm and we have changed to oral amiodarone.  Review of Systems: Cannot assess due to intubation  objective: Telemetry: Normal sinus rhythm Physical Exam: Blood pressure (!) 172/72, pulse 97, temperature 98.4 F (36.9 C), resp. rate (!) 28, height '5\' 7"'  (1.702 m), weight 83.4 kg, SpO2 92 %. Body mass index is 28.8 kg/m. General: Well developed, well nourished, in no acute distress. Head: Normocephalic, atraumatic, sclera non-icteric, no xanthomas, nares are without discharge. Neck: No apparent masses Lungs: Intubated on respirator with no wheezes, no rhonchi, no rales , no crackles   Heart: Regular rate and rhythm, normal S1 S2, no murmur, no rub, no gallop, PMI is normal size and placement, carotid upstroke normal without bruit, jugular venous pressure normal Abdomen: Soft,  non-distended with normoactive bowel sounds. No hepatosplenomegaly. Abdominal aorta is normal size without bruit Extremities: Bilateral BKA  peripheral: 2+ radial, 2+ femoral,   Neuro: Is not alert and oriented. Does not moves all extremities spontaneously. Psych: Does not responds to questions appropriately with a normal affect.   Intake/Output Summary (Last 24 hours) at 12/02/2019 1017 Last data filed at 12/02/2019 0516 Gross per 24 hour  Intake 1509.32 ml  Output 1400 ml  Net 109.32 ml    Inpatient Medications:  . chlorhexidine gluconate (MEDLINE KIT)  15 mL Mouth Rinse BID  .  Chlorhexidine Gluconate Cloth  6 each Topical Daily  . docusate  100 mg Oral BID  . free water  200 mL Per Tube Q4H  . insulin aspart  0-15 Units Subcutaneous Q4H  . insulin glargine  12 Units Subcutaneous QHS  . mouth rinse  15 mL Mouth Rinse 10 times per day  . polyethylene glycol  17 g Oral Daily  . rivaroxaban  15 mg Oral Q supper   Infusions:  . sodium chloride 75 mL/hr at 11/30/19 1119  . albumin human Stopped (12/01/19 1104)  . ampicillin-sulbactam (UNASYN) IV 200 mL/hr at 12/02/19 0516  . dextrose 5 % and 0.45% NaCl Stopped (11/30/19 1836)  . famotidine (PEPCID) IV 20 mg (12/02/19 0939)  . levETIRAcetam 500 mg (12/02/19 0920)  . propofol (DIPRIVAN) infusion 30 mcg/kg/min (12/02/19 0516)  . vasopressin      Labs: Recent Labs    12/01/19 0422 12/02/19 0415  NA 136 139  K 3.9 3.4*  CL 104 105  CO2 25 26  GLUCOSE 185* 125*  BUN 32* 28*  CREATININE 1.52* 1.56*  CALCIUM 9.4 8.6*  MG 1.9 1.9  PHOS  --  2.9   Recent Labs    12/01/19 0422 12/02/19 0415  AST 26 24  ALT 23 24  ALKPHOS 81 84  BILITOT 0.9 1.0  PROT 5.6* 5.3*  ALBUMIN 2.5* 2.6*   Recent Labs    12/01/19 0612 12/02/19 0415  WBC 17.2* 11.8*  NEUTROABS 14.1* 8.8*  HGB 12.7* 12.3*  HCT 36.9* 35.7*  MCV 85.8 86.2  PLT 204 197   No results for input(s): CKTOTAL, CKMB, TROPONINI in the last 72 hours. Invalid input(s): [place] Recent Labs    11/30/19 0841  HGBA1C 11.8*     Weights: Filed Weights   11/30/19 0444 12/01/19 0500 12/02/19 0500  Weight: 81.4 kg 83 kg 83.4 kg     Radiology/Studies:  EEG  Result Date: 11/30/2019 Alexis Goodell, MD     11/30/2019  4:20 PM ELECTROENCEPHALOGRAM REPORT Patient: [name]       Room #: IC06A-AA EEG No. ID: [id number] Age: 85 y.o.        Sex: male Requesting Physician: Lanney Gins Report Date:  11/30/2019       Interpreting Physician: Alexis Goodell History: Bartley Vuolo is an 85 y.o. male s/p arrest and seizure Medications: Keppra,  Unasyn, Insulin, Propofol, Vasopressin Conditions of Recording:  This is a 21 channel routine scalp EEG performed with bipolar and monopolar montages arranged in accordance to the international 10/20 system of electrode placement. One channel was dedicated to EKG recording. The patient is in the intubated and sedated state. Description:  The background activity is discontinuous. It consists of low voltage bursts of polyspike, spike and slow wave activity alternating with periods of attenuation. The burst activity lasts up to 4 seconds.  The periods of attenuation last up to 6 seconds. This discontinuous activity is maintained throughout the tracing. No activation procedures were performed.  Hyperventilation and intermittent photic stimulation were not performed. IMPRESSION: This is an abnormal EEG due to a burst-suppression pattern seen throughout the tracing.  Burst suppression pattern can be seen in a variety of circumstances, including anesthesia, drug intoxication, hypothermia, as well as cerebral anoxia.  Clinical/neurological, and radiographic correlation advised.  Alexis Goodell, MD Neurology [phone] 11/30/2019, 4:16 PM   CT ABDOMEN PELVIS WO CONTRAST  Result Date: 11/30/2019 CLINICAL DATA:  Found down with seizure-like activity, V-tach, seizure while in ED EXAM: CT HEAD WITHOUT CONTRAST CT CERVICAL SPINE WITHOUT CONTRAST CT CHEST, ABDOMEN AND PELVIS WITHOUT CONTRAST TECHNIQUE: Contiguous axial images were obtained from the base of the skull through the vertex without intravenous contrast. Multidetector CT imaging of the cervical spine was performed without intravenous contrast. Multiplanar CT image reconstructions were also generated. Multidetector CT imaging of the chest, abdomen and pelvis was performed following the standard protocol without IV contrast. COMPARISON:  CT head 09/16/2019, MR 06/05/2018, same day chest radiograph, CT abdomen pelvis 09/17/2006 FINDINGS: CT HEAD FINDINGS Brain: No  evidence of acute infarction, hemorrhage, hydrocephalus, extra-axial collection or mass lesion/mass effect. Symmetric prominence of the ventricles, cisterns and sulci compatible with parenchymal volume loss. Patchy areas of white matter hypoattenuation are most compatible with chronic microvascular angiopathy. Vascular: Atherosclerotic calcification of the carotid siphons and intradural vertebral arteries. No hyperdense vessel. Skull: No acute scalp swelling or hematoma. No calvarial fracture or other acute or suspicious osseous lesion. Chronic left occipital scalp infiltration, likely scarring. Small right frontal scalp lipoma, stable. Sinuses/Orbits: Paranasal sinuses and mastoid air cells are predominantly clear. Pneumatization of the petrous apices. Orbital structures are unremarkable aside from prior lens extractions. Other: Extensive debris in the external auditory canals. Mild bilateral TMJ arthrosis. Largely edentulous with mild mandibular prognathism. CT CERVICAL FINDINGS Alignment: Stabilization collar is absent. Mild straightening of the upper cervical lordosis with reversal at the C4-5 levels. Mild retrolisthesis C4 on 5 and C5 on 6 is likely degenerative in nature. No evidence of traumatic listhesis. No abnormally widened, perched or jumped facets. Normal alignment of the craniocervical and atlantoaxial articulations accounting for leftward cranial rotation. Skull base and vertebrae: No acute skull base fracture. No vertebral fracture or height loss. Arthrosis present at the atlantodental interval  with some mild calcific pannus formation. Multilevel spondylitic changes are detailed below. Soft tissues and spinal canal: No pre or paravertebral fluid or swelling. No visible canal hematoma. Enthesopathic changes in the nuchal ligament. Disc levels: Multilevel intervertebral disc height loss with spondylitic endplate changes. Features maximal C4-5 and C5-6 with small posterior disc osteophyte complexes  resulting in at most mild canal stenosis. Uncinate spurring facet hypertrophic changes are maximal at these levels as well with moderate to severe stenosis on the left at C5-6 and more moderate changes on the right at C4-5. Additional multilevel foraminal narrowing is mild. Other: Secretions noted in the posterior naso and oropharynx above the level of the inflated endotracheal balloon. Extensive cervical carotid atherosclerosis. No concerning thyroid nodules. CT CHEST FINDINGS Cardiovascular: Limited assessment of the vasculature in the absence of contrast media. Cardiac size within normal limits. Small pericardial effusion. Extensive three-vessel coronary artery disease. Extensive calcifications upon the aortic valve leaflets. Atherosclerotic plaque within the normal caliber aorta. Shared origin of brachiocephalic and left common carotid artery origins. Calcific plaque throughout the proximal great vessels. Central pulmonary arteries are borderline enlarged. Luminal evaluation precluded in the absence of contrast media. Mediastinum/Nodes: No mediastinal fluid or gas. Normal thyroid gland and thoracic inlet. No worrisome mediastinal or axillary adenopathy. Hilar nodal evaluation is limited in the absence of intravenous contrast media. Endotracheal tube terminates in the mid to lower trachea, 3 cm from the carina. Transesophageal tube tip terminates in the gastric body with side port distal to the GE junction. Lungs/Pleura: Heterogeneous dependent areas of mixed ground-glass and dense consolidative opacities are present in both lungs with multiple fluid-filled dependent airways as well as interlobular septal thickening and fissural thickening and small bilateral pleural effusions with further atelectatic changes and volume loss as well. No pneumothorax. Musculoskeletal: Posterior left third through sixth left rib fractures. Posterior right rib 4 rib fractures. No other acute osseous injuries in the chest wall.  Asymmetric sternoclavicular arthrosis, increased on the right. Degenerative changes in both shoulders. Multilevel degenerative changes present spine Multilevel flowing anterior osteophytosis, compatible with features of diffuse idiopathic skeletal hyperostosis (DISH). Mild body wall edema. CT ABDOMEN AND PELVIS FINDINGS Hepatobiliary: No visible hepatic injury or concerning liver lesion on this unenhanced CT. No perihepatic hematoma. Smooth surface contour. Normal attenuation. Patient is post cholecystectomy. Slight prominence of the biliary tree likely related to post cholecystectomy reservoir effect without visible calcified intraductal gallstones. Pancreas: Moderate pancreatic atrophy and partial fatty replacement of the pancreas. No ductal dilatation, discernible lesions, or inflammation. Spleen: No direct splenic injury. No perisplenic hematoma. Normal splenic size. No concerning lesions. Few scattered vascular calcifications. Adrenals/Urinary Tract: Mild lobular thickening of the adrenal gland could reflect some senescent hyperplasia without concerning nodules. No evidence of adrenal hemorrhage. No direct renal injury or perinephric hematoma. Bilateral renal atrophy with mild symmetric perinephric stranding, possibly related to senescent change or diminished renal function. Few vascular calcifications are present in both kidneys. Collecting system calculus seen in the upper pole right kidney. No obstructive urolithiasis is evident. Hyperdense 18 mm cystic structure arising from the posterior interpolar right kidney (3/72) additional subcentimeter hyperdense cystic structure arising from the upper pole left kidney (3/65). No other concerning renal lesions. Urinary bladder is largely decompressed by Foley catheter at the time of exam and therefore poorly evaluated by CT imaging. Further obscuration by streak artifact from a hip prosthesis. Stomach/Bowel: Transesophageal tube in appropriate position terminating  in the gastric body with side port distal to the GE junction as detailed  above. Mild tenting of the anterior stomach is a typical finding for placement of a transesophageal tube. Small duodenal lipoma. Small distal duodenal diverticulum (3/69). No small bowel thickening or dilatation. Mild fecalization of the distal small bowel contents can reflect some slowed intestinal transit Appendix is not visualized. No focal inflammation the vicinity of the cecum to suggest an occult appendicitis. No colonic dilatation or wall thickening. Vascular/Lymphatic: Extensive atherosclerotic calcification throughout the abdominal aorta and branch vessels. Luminal evaluation precluded in the absence of contrast media. No aneurysm or ectasia. No periaortic stranding or hemorrhage. No suspicious or enlarged lymph nodes in the included lymphatic chains. Reproductive: In prostate traversed by the Foley catheter. Coarse eccentric calcification of the prostate. No concerning abnormalities of the prostate or seminal vesicles. Nonspecific penile calcifications are noted. Other: No abdominopelvic free air or fluid. No bowel containing hernia. Small fat containing umbilical hernia. No traumatic abdominal wall dehiscence. No body wall or retroperitoneal hematoma. Musculoskeletal: The osseous structures appear diffusely demineralized which may limit detection of small or nondisplaced fractures. No acute osseous abnormality or suspicious osseous lesion. Extensive streak artifact related to patient's right hip arthroplasty. Multilevel degenerative changes are present in the imaged portions of the spine. Additional degenerative changes in the hips and pelvis. No acute osseous abnormality of the included upper extremities. IMPRESSION: CT HEAD: 1. No acute intracranial abnormality. 2. Stable parenchymal volume loss and chronic microvascular ischemic changes. 3. Debris in the external auditory canals. CT CERVICAL SPINE: 1. No acute cervical spine  fracture or traumatic listhesis. 2. Multilevel degenerative changes of the cervical spine, as described above. CT CHEST, ABDOMEN AND PELVIS 1. Unenhanced CT was performed per clinician order. Lack of IV contrast limits sensitivity and specificity, especially for evaluation of the vasculature and abdominal/pelvic solid viscera. 2. Posterior left third through sixth rib fractures. Posterior right rib 4 rib fractures. Can be seen in the setting of resuscitative efforts. No pneumothorax. 3. Heterogeneous largely dependent areas of mixed ground-glass and dense consolidation in both lungs with fluid-filled dependent airways as well as septal and fissural thickening, and small bilateral pleural effusions as well as features of volume loss. Such findings are worrisome for sequela of aspiration likely with superimposed pulmonary edema and volume loss. Mild contusive changes may also be present in the setting of resuscitation. 4. Endotracheal tube in the mid to lower trachea, 3 cm from the carina. 5. Transesophageal tube in appropriate position terminating in the gastric body with side port distal to the GE junction. 6. No other acute abdominopelvic abnormalities. 7. Nonobstructing right nephrolithiasis. 8. Hyperdense cystic structures arising from the lateral right interpolar kidney and upper pole left kidney., possible hyperdense exophytic cyst. Recommend outpatient renal ultrasound. 9. Aortic Atherosclerosis (ICD10-I70.0). Electronically Signed   By: Lovena Le M.D.   On: 11/30/2019 02:57   CT Head Wo Contrast  Result Date: 11/30/2019 CLINICAL DATA:  Found down with seizure-like activity, V-tach, seizure while in ED EXAM: CT HEAD WITHOUT CONTRAST CT CERVICAL SPINE WITHOUT CONTRAST CT CHEST, ABDOMEN AND PELVIS WITHOUT CONTRAST TECHNIQUE: Contiguous axial images were obtained from the base of the skull through the vertex without intravenous contrast. Multidetector CT imaging of the cervical spine was performed without  intravenous contrast. Multiplanar CT image reconstructions were also generated. Multidetector CT imaging of the chest, abdomen and pelvis was performed following the standard protocol without IV contrast. COMPARISON:  CT head 09/16/2019, MR 06/05/2018, same day chest radiograph, CT abdomen pelvis 09/17/2006 FINDINGS: CT HEAD FINDINGS Brain: No evidence of acute  infarction, hemorrhage, hydrocephalus, extra-axial collection or mass lesion/mass effect. Symmetric prominence of the ventricles, cisterns and sulci compatible with parenchymal volume loss. Patchy areas of white matter hypoattenuation are most compatible with chronic microvascular angiopathy. Vascular: Atherosclerotic calcification of the carotid siphons and intradural vertebral arteries. No hyperdense vessel. Skull: No acute scalp swelling or hematoma. No calvarial fracture or other acute or suspicious osseous lesion. Chronic left occipital scalp infiltration, likely scarring. Small right frontal scalp lipoma, stable. Sinuses/Orbits: Paranasal sinuses and mastoid air cells are predominantly clear. Pneumatization of the petrous apices. Orbital structures are unremarkable aside from prior lens extractions. Other: Extensive debris in the external auditory canals. Mild bilateral TMJ arthrosis. Largely edentulous with mild mandibular prognathism. CT CERVICAL FINDINGS Alignment: Stabilization collar is absent. Mild straightening of the upper cervical lordosis with reversal at the C4-5 levels. Mild retrolisthesis C4 on 5 and C5 on 6 is likely degenerative in nature. No evidence of traumatic listhesis. No abnormally widened, perched or jumped facets. Normal alignment of the craniocervical and atlantoaxial articulations accounting for leftward cranial rotation. Skull base and vertebrae: No acute skull base fracture. No vertebral fracture or height loss. Arthrosis present at the atlantodental interval with some mild calcific pannus formation. Multilevel spondylitic  changes are detailed below. Soft tissues and spinal canal: No pre or paravertebral fluid or swelling. No visible canal hematoma. Enthesopathic changes in the nuchal ligament. Disc levels: Multilevel intervertebral disc height loss with spondylitic endplate changes. Features maximal C4-5 and C5-6 with small posterior disc osteophyte complexes resulting in at most mild canal stenosis. Uncinate spurring facet hypertrophic changes are maximal at these levels as well with moderate to severe stenosis on the left at C5-6 and more moderate changes on the right at C4-5. Additional multilevel foraminal narrowing is mild. Other: Secretions noted in the posterior naso and oropharynx above the level of the inflated endotracheal balloon. Extensive cervical carotid atherosclerosis. No concerning thyroid nodules. CT CHEST FINDINGS Cardiovascular: Limited assessment of the vasculature in the absence of contrast media. Cardiac size within normal limits. Small pericardial effusion. Extensive three-vessel coronary artery disease. Extensive calcifications upon the aortic valve leaflets. Atherosclerotic plaque within the normal caliber aorta. Shared origin of brachiocephalic and left common carotid artery origins. Calcific plaque throughout the proximal great vessels. Central pulmonary arteries are borderline enlarged. Luminal evaluation precluded in the absence of contrast media. Mediastinum/Nodes: No mediastinal fluid or gas. Normal thyroid gland and thoracic inlet. No worrisome mediastinal or axillary adenopathy. Hilar nodal evaluation is limited in the absence of intravenous contrast media. Endotracheal tube terminates in the mid to lower trachea, 3 cm from the carina. Transesophageal tube tip terminates in the gastric body with side port distal to the GE junction. Lungs/Pleura: Heterogeneous dependent areas of mixed ground-glass and dense consolidative opacities are present in both lungs with multiple fluid-filled dependent airways  as well as interlobular septal thickening and fissural thickening and small bilateral pleural effusions with further atelectatic changes and volume loss as well. No pneumothorax. Musculoskeletal: Posterior left third through sixth left rib fractures. Posterior right rib 4 rib fractures. No other acute osseous injuries in the chest wall. Asymmetric sternoclavicular arthrosis, increased on the right. Degenerative changes in both shoulders. Multilevel degenerative changes present spine Multilevel flowing anterior osteophytosis, compatible with features of diffuse idiopathic skeletal hyperostosis (DISH). Mild body wall edema. CT ABDOMEN AND PELVIS FINDINGS Hepatobiliary: No visible hepatic injury or concerning liver lesion on this unenhanced CT. No perihepatic hematoma. Smooth surface contour. Normal attenuation. Patient is post cholecystectomy. Slight prominence of  the biliary tree likely related to post cholecystectomy reservoir effect without visible calcified intraductal gallstones. Pancreas: Moderate pancreatic atrophy and partial fatty replacement of the pancreas. No ductal dilatation, discernible lesions, or inflammation. Spleen: No direct splenic injury. No perisplenic hematoma. Normal splenic size. No concerning lesions. Few scattered vascular calcifications. Adrenals/Urinary Tract: Mild lobular thickening of the adrenal gland could reflect some senescent hyperplasia without concerning nodules. No evidence of adrenal hemorrhage. No direct renal injury or perinephric hematoma. Bilateral renal atrophy with mild symmetric perinephric stranding, possibly related to senescent change or diminished renal function. Few vascular calcifications are present in both kidneys. Collecting system calculus seen in the upper pole right kidney. No obstructive urolithiasis is evident. Hyperdense 18 mm cystic structure arising from the posterior interpolar right kidney (3/72) additional subcentimeter hyperdense cystic structure  arising from the upper pole left kidney (3/65). No other concerning renal lesions. Urinary bladder is largely decompressed by Foley catheter at the time of exam and therefore poorly evaluated by CT imaging. Further obscuration by streak artifact from a hip prosthesis. Stomach/Bowel: Transesophageal tube in appropriate position terminating in the gastric body with side port distal to the GE junction as detailed above. Mild tenting of the anterior stomach is a typical finding for placement of a transesophageal tube. Small duodenal lipoma. Small distal duodenal diverticulum (3/69). No small bowel thickening or dilatation. Mild fecalization of the distal small bowel contents can reflect some slowed intestinal transit Appendix is not visualized. No focal inflammation the vicinity of the cecum to suggest an occult appendicitis. No colonic dilatation or wall thickening. Vascular/Lymphatic: Extensive atherosclerotic calcification throughout the abdominal aorta and branch vessels. Luminal evaluation precluded in the absence of contrast media. No aneurysm or ectasia. No periaortic stranding or hemorrhage. No suspicious or enlarged lymph nodes in the included lymphatic chains. Reproductive: In prostate traversed by the Foley catheter. Coarse eccentric calcification of the prostate. No concerning abnormalities of the prostate or seminal vesicles. Nonspecific penile calcifications are noted. Other: No abdominopelvic free air or fluid. No bowel containing hernia. Small fat containing umbilical hernia. No traumatic abdominal wall dehiscence. No body wall or retroperitoneal hematoma. Musculoskeletal: The osseous structures appear diffusely demineralized which may limit detection of small or nondisplaced fractures. No acute osseous abnormality or suspicious osseous lesion. Extensive streak artifact related to patient's right hip arthroplasty. Multilevel degenerative changes are present in the imaged portions of the spine. Additional  degenerative changes in the hips and pelvis. No acute osseous abnormality of the included upper extremities. IMPRESSION: CT HEAD: 1. No acute intracranial abnormality. 2. Stable parenchymal volume loss and chronic microvascular ischemic changes. 3. Debris in the external auditory canals. CT CERVICAL SPINE: 1. No acute cervical spine fracture or traumatic listhesis. 2. Multilevel degenerative changes of the cervical spine, as described above. CT CHEST, ABDOMEN AND PELVIS 1. Unenhanced CT was performed per clinician order. Lack of IV contrast limits sensitivity and specificity, especially for evaluation of the vasculature and abdominal/pelvic solid viscera. 2. Posterior left third through sixth rib fractures. Posterior right rib 4 rib fractures. Can be seen in the setting of resuscitative efforts. No pneumothorax. 3. Heterogeneous largely dependent areas of mixed ground-glass and dense consolidation in both lungs with fluid-filled dependent airways as well as septal and fissural thickening, and small bilateral pleural effusions as well as features of volume loss. Such findings are worrisome for sequela of aspiration likely with superimposed pulmonary edema and volume loss. Mild contusive changes may also be present in the setting of resuscitation. 4. Endotracheal tube  in the mid to lower trachea, 3 cm from the carina. 5. Transesophageal tube in appropriate position terminating in the gastric body with side port distal to the GE junction. 6. No other acute abdominopelvic abnormalities. 7. Nonobstructing right nephrolithiasis. 8. Hyperdense cystic structures arising from the lateral right interpolar kidney and upper pole left kidney., possible hyperdense exophytic cyst. Recommend outpatient renal ultrasound. 9. Aortic Atherosclerosis (ICD10-I70.0). Electronically Signed   By: Lovena Le M.D.   On: 11/30/2019 02:57   CT Chest Wo Contrast  Result Date: 11/30/2019 CLINICAL DATA:  Found down with seizure-like activity,  V-tach, seizure while in ED EXAM: CT HEAD WITHOUT CONTRAST CT CERVICAL SPINE WITHOUT CONTRAST CT CHEST, ABDOMEN AND PELVIS WITHOUT CONTRAST TECHNIQUE: Contiguous axial images were obtained from the base of the skull through the vertex without intravenous contrast. Multidetector CT imaging of the cervical spine was performed without intravenous contrast. Multiplanar CT image reconstructions were also generated. Multidetector CT imaging of the chest, abdomen and pelvis was performed following the standard protocol without IV contrast. COMPARISON:  CT head 09/16/2019, MR 06/05/2018, same day chest radiograph, CT abdomen pelvis 09/17/2006 FINDINGS: CT HEAD FINDINGS Brain: No evidence of acute infarction, hemorrhage, hydrocephalus, extra-axial collection or mass lesion/mass effect. Symmetric prominence of the ventricles, cisterns and sulci compatible with parenchymal volume loss. Patchy areas of white matter hypoattenuation are most compatible with chronic microvascular angiopathy. Vascular: Atherosclerotic calcification of the carotid siphons and intradural vertebral arteries. No hyperdense vessel. Skull: No acute scalp swelling or hematoma. No calvarial fracture or other acute or suspicious osseous lesion. Chronic left occipital scalp infiltration, likely scarring. Small right frontal scalp lipoma, stable. Sinuses/Orbits: Paranasal sinuses and mastoid air cells are predominantly clear. Pneumatization of the petrous apices. Orbital structures are unremarkable aside from prior lens extractions. Other: Extensive debris in the external auditory canals. Mild bilateral TMJ arthrosis. Largely edentulous with mild mandibular prognathism. CT CERVICAL FINDINGS Alignment: Stabilization collar is absent. Mild straightening of the upper cervical lordosis with reversal at the C4-5 levels. Mild retrolisthesis C4 on 5 and C5 on 6 is likely degenerative in nature. No evidence of traumatic listhesis. No abnormally widened, perched or  jumped facets. Normal alignment of the craniocervical and atlantoaxial articulations accounting for leftward cranial rotation. Skull base and vertebrae: No acute skull base fracture. No vertebral fracture or height loss. Arthrosis present at the atlantodental interval with some mild calcific pannus formation. Multilevel spondylitic changes are detailed below. Soft tissues and spinal canal: No pre or paravertebral fluid or swelling. No visible canal hematoma. Enthesopathic changes in the nuchal ligament. Disc levels: Multilevel intervertebral disc height loss with spondylitic endplate changes. Features maximal C4-5 and C5-6 with small posterior disc osteophyte complexes resulting in at most mild canal stenosis. Uncinate spurring facet hypertrophic changes are maximal at these levels as well with moderate to severe stenosis on the left at C5-6 and more moderate changes on the right at C4-5. Additional multilevel foraminal narrowing is mild. Other: Secretions noted in the posterior naso and oropharynx above the level of the inflated endotracheal balloon. Extensive cervical carotid atherosclerosis. No concerning thyroid nodules. CT CHEST FINDINGS Cardiovascular: Limited assessment of the vasculature in the absence of contrast media. Cardiac size within normal limits. Small pericardial effusion. Extensive three-vessel coronary artery disease. Extensive calcifications upon the aortic valve leaflets. Atherosclerotic plaque within the normal caliber aorta. Shared origin of brachiocephalic and left common carotid artery origins. Calcific plaque throughout the proximal great vessels. Central pulmonary arteries are borderline enlarged. Luminal evaluation precluded in the  absence of contrast media. Mediastinum/Nodes: No mediastinal fluid or gas. Normal thyroid gland and thoracic inlet. No worrisome mediastinal or axillary adenopathy. Hilar nodal evaluation is limited in the absence of intravenous contrast media. Endotracheal  tube terminates in the mid to lower trachea, 3 cm from the carina. Transesophageal tube tip terminates in the gastric body with side port distal to the GE junction. Lungs/Pleura: Heterogeneous dependent areas of mixed ground-glass and dense consolidative opacities are present in both lungs with multiple fluid-filled dependent airways as well as interlobular septal thickening and fissural thickening and small bilateral pleural effusions with further atelectatic changes and volume loss as well. No pneumothorax. Musculoskeletal: Posterior left third through sixth left rib fractures. Posterior right rib 4 rib fractures. No other acute osseous injuries in the chest wall. Asymmetric sternoclavicular arthrosis, increased on the right. Degenerative changes in both shoulders. Multilevel degenerative changes present spine Multilevel flowing anterior osteophytosis, compatible with features of diffuse idiopathic skeletal hyperostosis (DISH). Mild body wall edema. CT ABDOMEN AND PELVIS FINDINGS Hepatobiliary: No visible hepatic injury or concerning liver lesion on this unenhanced CT. No perihepatic hematoma. Smooth surface contour. Normal attenuation. Patient is post cholecystectomy. Slight prominence of the biliary tree likely related to post cholecystectomy reservoir effect without visible calcified intraductal gallstones. Pancreas: Moderate pancreatic atrophy and partial fatty replacement of the pancreas. No ductal dilatation, discernible lesions, or inflammation. Spleen: No direct splenic injury. No perisplenic hematoma. Normal splenic size. No concerning lesions. Few scattered vascular calcifications. Adrenals/Urinary Tract: Mild lobular thickening of the adrenal gland could reflect some senescent hyperplasia without concerning nodules. No evidence of adrenal hemorrhage. No direct renal injury or perinephric hematoma. Bilateral renal atrophy with mild symmetric perinephric stranding, possibly related to senescent change or  diminished renal function. Few vascular calcifications are present in both kidneys. Collecting system calculus seen in the upper pole right kidney. No obstructive urolithiasis is evident. Hyperdense 18 mm cystic structure arising from the posterior interpolar right kidney (3/72) additional subcentimeter hyperdense cystic structure arising from the upper pole left kidney (3/65). No other concerning renal lesions. Urinary bladder is largely decompressed by Foley catheter at the time of exam and therefore poorly evaluated by CT imaging. Further obscuration by streak artifact from a hip prosthesis. Stomach/Bowel: Transesophageal tube in appropriate position terminating in the gastric body with side port distal to the GE junction as detailed above. Mild tenting of the anterior stomach is a typical finding for placement of a transesophageal tube. Small duodenal lipoma. Small distal duodenal diverticulum (3/69). No small bowel thickening or dilatation. Mild fecalization of the distal small bowel contents can reflect some slowed intestinal transit Appendix is not visualized. No focal inflammation the vicinity of the cecum to suggest an occult appendicitis. No colonic dilatation or wall thickening. Vascular/Lymphatic: Extensive atherosclerotic calcification throughout the abdominal aorta and branch vessels. Luminal evaluation precluded in the absence of contrast media. No aneurysm or ectasia. No periaortic stranding or hemorrhage. No suspicious or enlarged lymph nodes in the included lymphatic chains. Reproductive: In prostate traversed by the Foley catheter. Coarse eccentric calcification of the prostate. No concerning abnormalities of the prostate or seminal vesicles. Nonspecific penile calcifications are noted. Other: No abdominopelvic free air or fluid. No bowel containing hernia. Small fat containing umbilical hernia. No traumatic abdominal wall dehiscence. No body wall or retroperitoneal hematoma. Musculoskeletal: The  osseous structures appear diffusely demineralized which may limit detection of small or nondisplaced fractures. No acute osseous abnormality or suspicious osseous lesion. Extensive streak artifact related to patient's right hip  arthroplasty. Multilevel degenerative changes are present in the imaged portions of the spine. Additional degenerative changes in the hips and pelvis. No acute osseous abnormality of the included upper extremities. IMPRESSION: CT HEAD: 1. No acute intracranial abnormality. 2. Stable parenchymal volume loss and chronic microvascular ischemic changes. 3. Debris in the external auditory canals. CT CERVICAL SPINE: 1. No acute cervical spine fracture or traumatic listhesis. 2. Multilevel degenerative changes of the cervical spine, as described above. CT CHEST, ABDOMEN AND PELVIS 1. Unenhanced CT was performed per clinician order. Lack of IV contrast limits sensitivity and specificity, especially for evaluation of the vasculature and abdominal/pelvic solid viscera. 2. Posterior left third through sixth rib fractures. Posterior right rib 4 rib fractures. Can be seen in the setting of resuscitative efforts. No pneumothorax. 3. Heterogeneous largely dependent areas of mixed ground-glass and dense consolidation in both lungs with fluid-filled dependent airways as well as septal and fissural thickening, and small bilateral pleural effusions as well as features of volume loss. Such findings are worrisome for sequela of aspiration likely with superimposed pulmonary edema and volume loss. Mild contusive changes may also be present in the setting of resuscitation. 4. Endotracheal tube in the mid to lower trachea, 3 cm from the carina. 5. Transesophageal tube in appropriate position terminating in the gastric body with side port distal to the GE junction. 6. No other acute abdominopelvic abnormalities. 7. Nonobstructing right nephrolithiasis. 8. Hyperdense cystic structures arising from the lateral right  interpolar kidney and upper pole left kidney., possible hyperdense exophytic cyst. Recommend outpatient renal ultrasound. 9. Aortic Atherosclerosis (ICD10-I70.0). Electronically Signed   By: Lovena Le M.D.   On: 11/30/2019 02:57   CT Cervical Spine Wo Contrast  Result Date: 11/30/2019 CLINICAL DATA:  Found down with seizure-like activity, V-tach, seizure while in ED EXAM: CT HEAD WITHOUT CONTRAST CT CERVICAL SPINE WITHOUT CONTRAST CT CHEST, ABDOMEN AND PELVIS WITHOUT CONTRAST TECHNIQUE: Contiguous axial images were obtained from the base of the skull through the vertex without intravenous contrast. Multidetector CT imaging of the cervical spine was performed without intravenous contrast. Multiplanar CT image reconstructions were also generated. Multidetector CT imaging of the chest, abdomen and pelvis was performed following the standard protocol without IV contrast. COMPARISON:  CT head 09/16/2019, MR 06/05/2018, same day chest radiograph, CT abdomen pelvis 09/17/2006 FINDINGS: CT HEAD FINDINGS Brain: No evidence of acute infarction, hemorrhage, hydrocephalus, extra-axial collection or mass lesion/mass effect. Symmetric prominence of the ventricles, cisterns and sulci compatible with parenchymal volume loss. Patchy areas of white matter hypoattenuation are most compatible with chronic microvascular angiopathy. Vascular: Atherosclerotic calcification of the carotid siphons and intradural vertebral arteries. No hyperdense vessel. Skull: No acute scalp swelling or hematoma. No calvarial fracture or other acute or suspicious osseous lesion. Chronic left occipital scalp infiltration, likely scarring. Small right frontal scalp lipoma, stable. Sinuses/Orbits: Paranasal sinuses and mastoid air cells are predominantly clear. Pneumatization of the petrous apices. Orbital structures are unremarkable aside from prior lens extractions. Other: Extensive debris in the external auditory canals. Mild bilateral TMJ arthrosis.  Largely edentulous with mild mandibular prognathism. CT CERVICAL FINDINGS Alignment: Stabilization collar is absent. Mild straightening of the upper cervical lordosis with reversal at the C4-5 levels. Mild retrolisthesis C4 on 5 and C5 on 6 is likely degenerative in nature. No evidence of traumatic listhesis. No abnormally widened, perched or jumped facets. Normal alignment of the craniocervical and atlantoaxial articulations accounting for leftward cranial rotation. Skull base and vertebrae: No acute skull base fracture. No vertebral fracture or  height loss. Arthrosis present at the atlantodental interval with some mild calcific pannus formation. Multilevel spondylitic changes are detailed below. Soft tissues and spinal canal: No pre or paravertebral fluid or swelling. No visible canal hematoma. Enthesopathic changes in the nuchal ligament. Disc levels: Multilevel intervertebral disc height loss with spondylitic endplate changes. Features maximal C4-5 and C5-6 with small posterior disc osteophyte complexes resulting in at most mild canal stenosis. Uncinate spurring facet hypertrophic changes are maximal at these levels as well with moderate to severe stenosis on the left at C5-6 and more moderate changes on the right at C4-5. Additional multilevel foraminal narrowing is mild. Other: Secretions noted in the posterior naso and oropharynx above the level of the inflated endotracheal balloon. Extensive cervical carotid atherosclerosis. No concerning thyroid nodules. CT CHEST FINDINGS Cardiovascular: Limited assessment of the vasculature in the absence of contrast media. Cardiac size within normal limits. Small pericardial effusion. Extensive three-vessel coronary artery disease. Extensive calcifications upon the aortic valve leaflets. Atherosclerotic plaque within the normal caliber aorta. Shared origin of brachiocephalic and left common carotid artery origins. Calcific plaque throughout the proximal great vessels.  Central pulmonary arteries are borderline enlarged. Luminal evaluation precluded in the absence of contrast media. Mediastinum/Nodes: No mediastinal fluid or gas. Normal thyroid gland and thoracic inlet. No worrisome mediastinal or axillary adenopathy. Hilar nodal evaluation is limited in the absence of intravenous contrast media. Endotracheal tube terminates in the mid to lower trachea, 3 cm from the carina. Transesophageal tube tip terminates in the gastric body with side port distal to the GE junction. Lungs/Pleura: Heterogeneous dependent areas of mixed ground-glass and dense consolidative opacities are present in both lungs with multiple fluid-filled dependent airways as well as interlobular septal thickening and fissural thickening and small bilateral pleural effusions with further atelectatic changes and volume loss as well. No pneumothorax. Musculoskeletal: Posterior left third through sixth left rib fractures. Posterior right rib 4 rib fractures. No other acute osseous injuries in the chest wall. Asymmetric sternoclavicular arthrosis, increased on the right. Degenerative changes in both shoulders. Multilevel degenerative changes present spine Multilevel flowing anterior osteophytosis, compatible with features of diffuse idiopathic skeletal hyperostosis (DISH). Mild body wall edema. CT ABDOMEN AND PELVIS FINDINGS Hepatobiliary: No visible hepatic injury or concerning liver lesion on this unenhanced CT. No perihepatic hematoma. Smooth surface contour. Normal attenuation. Patient is post cholecystectomy. Slight prominence of the biliary tree likely related to post cholecystectomy reservoir effect without visible calcified intraductal gallstones. Pancreas: Moderate pancreatic atrophy and partial fatty replacement of the pancreas. No ductal dilatation, discernible lesions, or inflammation. Spleen: No direct splenic injury. No perisplenic hematoma. Normal splenic size. No concerning lesions. Few scattered  vascular calcifications. Adrenals/Urinary Tract: Mild lobular thickening of the adrenal gland could reflect some senescent hyperplasia without concerning nodules. No evidence of adrenal hemorrhage. No direct renal injury or perinephric hematoma. Bilateral renal atrophy with mild symmetric perinephric stranding, possibly related to senescent change or diminished renal function. Few vascular calcifications are present in both kidneys. Collecting system calculus seen in the upper pole right kidney. No obstructive urolithiasis is evident. Hyperdense 18 mm cystic structure arising from the posterior interpolar right kidney (3/72) additional subcentimeter hyperdense cystic structure arising from the upper pole left kidney (3/65). No other concerning renal lesions. Urinary bladder is largely decompressed by Foley catheter at the time of exam and therefore poorly evaluated by CT imaging. Further obscuration by streak artifact from a hip prosthesis. Stomach/Bowel: Transesophageal tube in appropriate position terminating in the gastric body with side port  distal to the GE junction as detailed above. Mild tenting of the anterior stomach is a typical finding for placement of a transesophageal tube. Small duodenal lipoma. Small distal duodenal diverticulum (3/69). No small bowel thickening or dilatation. Mild fecalization of the distal small bowel contents can reflect some slowed intestinal transit Appendix is not visualized. No focal inflammation the vicinity of the cecum to suggest an occult appendicitis. No colonic dilatation or wall thickening. Vascular/Lymphatic: Extensive atherosclerotic calcification throughout the abdominal aorta and branch vessels. Luminal evaluation precluded in the absence of contrast media. No aneurysm or ectasia. No periaortic stranding or hemorrhage. No suspicious or enlarged lymph nodes in the included lymphatic chains. Reproductive: In prostate traversed by the Foley catheter. Coarse eccentric  calcification of the prostate. No concerning abnormalities of the prostate or seminal vesicles. Nonspecific penile calcifications are noted. Other: No abdominopelvic free air or fluid. No bowel containing hernia. Small fat containing umbilical hernia. No traumatic abdominal wall dehiscence. No body wall or retroperitoneal hematoma. Musculoskeletal: The osseous structures appear diffusely demineralized which may limit detection of small or nondisplaced fractures. No acute osseous abnormality or suspicious osseous lesion. Extensive streak artifact related to patient's right hip arthroplasty. Multilevel degenerative changes are present in the imaged portions of the spine. Additional degenerative changes in the hips and pelvis. No acute osseous abnormality of the included upper extremities. IMPRESSION: CT HEAD: 1. No acute intracranial abnormality. 2. Stable parenchymal volume loss and chronic microvascular ischemic changes. 3. Debris in the external auditory canals. CT CERVICAL SPINE: 1. No acute cervical spine fracture or traumatic listhesis. 2. Multilevel degenerative changes of the cervical spine, as described above. CT CHEST, ABDOMEN AND PELVIS 1. Unenhanced CT was performed per clinician order. Lack of IV contrast limits sensitivity and specificity, especially for evaluation of the vasculature and abdominal/pelvic solid viscera. 2. Posterior left third through sixth rib fractures. Posterior right rib 4 rib fractures. Can be seen in the setting of resuscitative efforts. No pneumothorax. 3. Heterogeneous largely dependent areas of mixed ground-glass and dense consolidation in both lungs with fluid-filled dependent airways as well as septal and fissural thickening, and small bilateral pleural effusions as well as features of volume loss. Such findings are worrisome for sequela of aspiration likely with superimposed pulmonary edema and volume loss. Mild contusive changes may also be present in the setting of  resuscitation. 4. Endotracheal tube in the mid to lower trachea, 3 cm from the carina. 5. Transesophageal tube in appropriate position terminating in the gastric body with side port distal to the GE junction. 6. No other acute abdominopelvic abnormalities. 7. Nonobstructing right nephrolithiasis. 8. Hyperdense cystic structures arising from the lateral right interpolar kidney and upper pole left kidney., possible hyperdense exophytic cyst. Recommend outpatient renal ultrasound. 9. Aortic Atherosclerosis (ICD10-I70.0). Electronically Signed   By: Lovena Le M.D.   On: 11/30/2019 02:57   Portable Chest xray  Result Date: 12/01/2019 CLINICAL DATA:  Acute respiratory failure EXAM: PORTABLE CHEST 1 VIEW COMPARISON:  Chest x-rays dated 11/30/2019 and 09/16/2019. FINDINGS: Heart size and mediastinal contours are stable. Endotracheal tube has been placed with tip adequately positioned approximately 4 cm above the carina. Enteric tube in place. Pacer pad overlies the LEFT heart. Central pulmonary vascular congestion and probable interstitial edema. No evidence of pneumonia. No pleural effusion or pneumothorax is seen. IMPRESSION: 1. Endotracheal tube adequately positioned with tip approximately 4 cm above the carina. 2. Probable mild interstitial edema. Electronically Signed   By: Franki Cabot M.D.   On: 12/01/2019  06:05   DG Chest Portable 1 View  Result Date: 11/30/2019 CLINICAL DATA:  Shortness of breath EXAM: PORTABLE CHEST 1 VIEW COMPARISON:  09/16/2019 FINDINGS: Mild cardiomegaly. Diffuse interstitial opacity. No pleural effusion or pneumothorax. No focal airspace consolidation. IMPRESSION: Cardiomegaly and mild pulmonary edema. Electronically Signed   By: Ulyses Jarred M.D.   On: 11/30/2019 01:55   ECHOCARDIOGRAM COMPLETE  Result Date: 11/30/2019    ECHOCARDIOGRAM REPORT   Patient Name:   JAVI BOLLMAN Date of Exam: 11/30/2019 Medical Rec #:  [id number]             Height:       67.0 in Accession #:     [id number]            Weight:       179.5 lb Date of Birth:  1934/09/01            BSA:          1.931 m Patient Age:    43 years              BP:           120/64 mmHg Patient Gender: M                     HR:           63 bpm. Exam Location:  [facility] Procedure: 2D Echo, Cardiac Doppler and Color Doppler Indications:     Abnormal ECG 794.31  History:         Patient has prior history of Echocardiogram examinations, most                  recent 06/25/2019. CHF, Arrythmias:Atrial Fibrillation; Risk                  Factors:Hypertension and Diabetes. PVD.  Sonographer:     Sherrie Sport RDCS (AE) Referring Phys:  [id number] Bradly Bienenstock Diagnosing Phys: Ida Rogue MD  Sonographer Comments: Technically difficult study due to poor echo windows, no apical window, no parasternal window and echo performed with patient supine and on artificial respirator. IMPRESSIONS  1. Left ventricular ejection fraction, by estimation, is 50 to 55%. The left ventricle has low normal function. Unable to exclude regional wall motion abnormality.There is mild left ventricular hypertrophy. Left ventricular diastolic parameters are indeterminate.  2. Right ventricular systolic function was not well visualized. The right ventricular size is normal.  3. Challenging image quality FINDINGS  Left Ventricle: Left ventricular ejection fraction, by estimation, is 50 to 55%. The left ventricle has low normal function. The left ventricle has no regional wall motion abnormalities. The left ventricular internal cavity size was normal in size. There is mild left ventricular hypertrophy. Left ventricular diastolic parameters are indeterminate. Right Ventricle: The right ventricular size is normal. No increase in right ventricular wall thickness. Right ventricular systolic function was not well visualized. Left Atrium: Left atrial size was not well visualized. Right Atrium: Right atrial size was not well visualized. Pericardium: There is no evidence of  pericardial effusion. Mitral Valve: The mitral valve was not well visualized. Normal mobility of the mitral valve leaflets. No evidence of mitral valve regurgitation. No evidence of mitral valve stenosis. Tricuspid Valve: The tricuspid valve is not well visualized. Tricuspid valve regurgitation is not demonstrated. No evidence of tricuspid stenosis. Aortic Valve: The aortic valve was not well visualized. Aortic valve regurgitation is not visualized. No aortic stenosis is present. Pulmonic Valve: The  pulmonic valve was not well visualized. Pulmonic valve regurgitation is not visualized. No evidence of pulmonic stenosis. Aorta: The aortic root was not well visualized and the ascending aorta was not well visualized. Venous: The pulmonary veins were not well visualized. The inferior vena cava is normal in size with greater than 50% respiratory variability, suggesting right atrial pressure of 3 mmHg. IAS/Shunts: No atrial level shunt detected by color flow Doppler.  LEFT VENTRICLE PLAX 2D LVIDd:         4.95 cm LVIDs:         3.32 cm LV PW:         1.61 cm LV IVS:        1.49 cm  LEFT ATRIUM         Index LA diam:    4.80 cm 2.49 cm/m  PULMONIC VALVE PV Vmax:        0.86 m/s PV Peak grad:   3.0 mmHg RVOT Peak grad: 4 mmHg  Ida Rogue MD Electronically signed by Ida Rogue MD Signature Date/Time: 11/30/2019/6:57:51 PM    Final      Assessment and Recommendation  85 y.o. male with known severe peripheral vascular disease diabetes hypertension hyperlipidemia sleep apnea with apparent hypoxia and acute on chronic diastolic dysfunction congestive heart failure causing seizure with atrial fibrillation with rapid ventricular rate now spontaneously converted to normal sinus rhythm with amiodarone drip and stable from the cardiovascular standpoint without evidence of myocardial infarction 1. Continue supportive care for respiratory failure seizure disorder and/or hypoxia without restriction 2. Continue amiodarone  orally at 200 mg twice per day for maintenance of normal sinus rhythm 3. Anticoagulation with Xarelto for further risk reduction in stroke with atrial fibrillation 4. No use of beta-blocker at this time due to concerns of bradycardia on top of use of amiodarone 5. No further cardiac diagnostics necessary at this time 6.  Okay for any diagnostic procedures necessary for neurologic assessment  Signed, Serafina Royals M.D. FACC

## 2019-12-02 NOTE — Progress Notes (Addendum)
Had several runs of Vtach, but a pulse was present the entire time. Notified Dr. Lanney Gins of rhythm, he quickly came to bedside, defib pads placed, but with the administration of 5 mg of metropolol he converted back to sinus tach. Will continue to monitor

## 2019-12-03 ENCOUNTER — Encounter: Payer: Self-pay | Admitting: Pulmonary Disease

## 2019-12-03 DIAGNOSIS — Z7189 Other specified counseling: Secondary | ICD-10-CM

## 2019-12-03 DIAGNOSIS — Z515 Encounter for palliative care: Secondary | ICD-10-CM

## 2019-12-03 DIAGNOSIS — R569 Unspecified convulsions: Secondary | ICD-10-CM | POA: Diagnosis not present

## 2019-12-03 DIAGNOSIS — J9601 Acute respiratory failure with hypoxia: Secondary | ICD-10-CM

## 2019-12-03 DIAGNOSIS — J690 Pneumonitis due to inhalation of food and vomit: Secondary | ICD-10-CM

## 2019-12-03 LAB — COMPREHENSIVE METABOLIC PANEL
ALT: 21 U/L (ref 0–44)
AST: 23 U/L (ref 15–41)
Albumin: 2.5 g/dL — ABNORMAL LOW (ref 3.5–5.0)
Alkaline Phosphatase: 90 U/L (ref 38–126)
Anion gap: 5 (ref 5–15)
BUN: 30 mg/dL — ABNORMAL HIGH (ref 8–23)
CO2: 28 mmol/L (ref 22–32)
Calcium: 8.4 mg/dL — ABNORMAL LOW (ref 8.9–10.3)
Chloride: 108 mmol/L (ref 98–111)
Creatinine, Ser: 1.62 mg/dL — ABNORMAL HIGH (ref 0.61–1.24)
GFR calc Af Amer: 45 mL/min — ABNORMAL LOW (ref >60)
GFR calc non Af Amer: 39 mL/min — ABNORMAL LOW (ref >60)
Glucose, Bld: 140 mg/dL — ABNORMAL HIGH (ref 70–99)
Potassium: 4 mmol/L (ref 3.5–5.1)
Sodium: 141 mmol/L (ref 135–145)
Total Bilirubin: 1.2 mg/dL (ref 0.3–1.2)
Total Protein: 5.6 g/dL — ABNORMAL LOW (ref 6.5–8.1)

## 2019-12-03 LAB — MAGNESIUM: Magnesium: 2.3 mg/dL (ref 1.7–2.4)

## 2019-12-03 LAB — CBC WITH DIFFERENTIAL/PLATELET
Abs Immature Granulocytes: 0.08 10*3/uL — ABNORMAL HIGH (ref 0.00–0.07)
Basophils Absolute: 0.1 10*3/uL (ref 0.0–0.1)
Basophils Relative: 1 %
Eosinophils Absolute: 0.4 10*3/uL (ref 0.0–0.5)
Eosinophils Relative: 3 %
HCT: 37.9 % — ABNORMAL LOW (ref 39.0–52.0)
Hemoglobin: 12.7 g/dL — ABNORMAL LOW (ref 13.0–17.0)
Immature Granulocytes: 1 %
Lymphocytes Relative: 9 %
Lymphs Abs: 1.1 10*3/uL (ref 0.7–4.0)
MCH: 29.1 pg (ref 26.0–34.0)
MCHC: 33.5 g/dL (ref 30.0–36.0)
MCV: 86.7 fL (ref 80.0–100.0)
Monocytes Absolute: 0.8 10*3/uL (ref 0.1–1.0)
Monocytes Relative: 6 %
Neutro Abs: 10.7 10*3/uL — ABNORMAL HIGH (ref 1.7–7.7)
Neutrophils Relative %: 80 %
Platelets: 217 10*3/uL (ref 150–400)
RBC: 4.37 MIL/uL (ref 4.22–5.81)
RDW: 14.4 % (ref 11.5–15.5)
WBC: 13.2 10*3/uL — ABNORMAL HIGH (ref 4.0–10.5)
nRBC: 0 % (ref 0.0–0.2)

## 2019-12-03 LAB — GLUCOSE, CAPILLARY
Glucose-Capillary: 125 mg/dL — ABNORMAL HIGH (ref 70–99)
Glucose-Capillary: 130 mg/dL — ABNORMAL HIGH (ref 70–99)
Glucose-Capillary: 100 mg/dL — ABNORMAL HIGH (ref 70–99)
Glucose-Capillary: 108 mg/dL — ABNORMAL HIGH (ref 70–99)
Glucose-Capillary: 143 mg/dL — ABNORMAL HIGH (ref 70–99)
Glucose-Capillary: 128 mg/dL — ABNORMAL HIGH (ref 70–99)

## 2019-12-03 LAB — TRIGLYCERIDES: Triglycerides: 147 mg/dL (ref <150)

## 2019-12-03 MED ORDER — LABETALOL HCL 5 MG/ML IV SOLN
10.0000 mg | INTRAVENOUS | Status: DC | PRN
  Administered 2019-12-03 – 2019-12-06 (×4): 20 mg via INTRAVENOUS
  Filled 2019-12-03 (×4): qty 4

## 2019-12-03 MED ORDER — VITAL AF 1.2 CAL PO LIQD
1000.0000 mL | ORAL | Status: DC
  Administered 2019-12-03 (×2): 1000 mL

## 2019-12-03 NOTE — Progress Notes (Addendum)
CRITICAL CARE NOTE  85 year old male with a past medical history significant for atrial fibrillation on Xarelto, HFpEF, CKD, diabetes mellitus, bilateral BKA's, PVD, sleep apnea, and COVID-19 infection in September 2020  PCCM is asked to admit the pt to ICU for further workup and treatment of Tonic-Clonic seizure, Acute Hypoxic Hypercapnic Respiratory Failure secondary to Aspiration Pneumonia & Pulmonary Edema, A-fib w/ RVR, intermittent nonsustained V-tach, and questionable NSTEMI.  Cardiology and Neurology are consulted.     11/30/19- Patient remains on MV weaning FiO2 to 80%, s/p TTE this AM, plan for EEG tonight. Discussed short term care plan with neurology Dr Doy Mince.  Tracheal aspirate with thick solid chunks of discolored material concerning for aspiration. INTUBATED/SEDATED  12/01/19- Patient improved , FiO2 50% 12/02/19 - patient is further weaned on FIO2 , he did not tolerate SBT, had sustained VT today with pulse responsive to beta blockade.  Will re-attempt SBT tommorow  7/11 failed weaning trials ?family 7/12 remains on vent     CC  follow up respiratory failure  SUBJECTIVE Patient remains critically ill Prognosis is guarded   BP (!) 174/60   Pulse (!) 59   Temp 98.1 F (36.7 C) (Bladder)   Resp (!) 22   Ht '5\' 7"'  (1.702 m)   Wt 83.4 kg   SpO2 97%   BMI 28.80 kg/m    I/O last 3 completed shifts: In: 2534.7 [I.V.:725.7; NG/GT:120; IV Piggyback:1689.1] Out: 2876 [Urine:2350] No intake/output data recorded.  SpO2: 97 % FiO2 (%): 40 %  Estimated body mass index is 28.8 kg/m as calculated from the following:   Height as of this encounter: '5\' 7"'  (1.702 m).   Weight as of this encounter: 83.4 kg.  SIGNIFICANT EVENTS   REVIEW OF SYSTEMS  PATIENT IS UNABLE TO PROVIDE COMPLETE REVIEW OF SYSTEMS DUE TO SEVERE CRITICAL ILLNESS   Pressure Injury 02/08/19 Buttocks Right;Lower Stage II -  Partial thickness loss of dermis presenting as a shallow open ulcer with  a red, pink wound bed without slough. (Active)  02/08/19 1610  Location: Buttocks  Location Orientation: Right;Lower  Staging: Stage II -  Partial thickness loss of dermis presenting as a shallow open ulcer with a red, pink wound bed without slough.  Wound Description (Comments):   Present on Admission: Yes     Pressure Injury 02/08/19 Scrotum Posterior;Medial Stage II -  Partial thickness loss of dermis presenting as a shallow open ulcer with a red, pink wound bed without slough. (Active)  02/08/19 1611  Location: Scrotum  Location Orientation: Posterior;Medial  Staging: Stage II -  Partial thickness loss of dermis presenting as a shallow open ulcer with a red, pink wound bed without slough.  Wound Description (Comments):   Present on Admission: Yes      PHYSICAL EXAMINATION:  GENERAL:critically ill appearing, +resp distress HEAD: Normocephalic, atraumatic.  EYES: Pupils equal, round, reactive to light.  No scleral icterus.  MOUTH: Moist mucosal membrane. NECK: Supple.  PULMONARY: +rhonchi, +wheezing CARDIOVASCULAR: S1 and S2. Regular rate and rhythm. No murmurs, rubs, or gallops.  GASTROINTESTINAL: Soft, nontender, -distended.  Positive bowel sounds.   MUSCULOSKELETAL: Bilateral BKA's  NEUROLOGIC: obtunded, GCS<8 SKIN:intact,warm,dry  MEDICATIONS: I have reviewed all medications and confirmed regimen as documented   CULTURE RESULTS   Recent Results (from the past 240 hour(s))  SARS Coronavirus 2 by RT PCR (hospital order, performed in [facility] hospital lab) Nasopharyngeal Nasopharyngeal Swab     Status: None   Collection Time: 11/30/19  2:54 AM  Specimen: Nasopharyngeal Swab  Result Value Ref Range Status   SARS Coronavirus 2 NEGATIVE NEGATIVE Final    Comment: (NOTE) SARS-CoV-2 target nucleic acids are NOT DETECTED.  The SARS-CoV-2 RNA is generally detectable in upper and lower respiratory specimens during the acute phase of infection. The lowest concentration  of SARS-CoV-2 viral copies this assay can detect is 250 copies / mL. A negative result does not preclude SARS-CoV-2 infection and should not be used as the sole basis for treatment or other patient management decisions.  A negative result may occur with improper specimen collection / handling, submission of specimen other than nasopharyngeal swab, presence of viral mutation(s) within the areas targeted by this assay, and inadequate number of viral copies (<250 copies / mL). A negative result must be combined with clinical observations, patient history, and epidemiological information.  Fact Sheet for Patients:   StrictlyIdeas.no  Fact Sheet for Healthcare Providers: BankingDealers.co.za  This test is not yet approved or  cleared by the [place] FDA and has been authorized for detection and/or diagnosis of SARS-CoV-2 by FDA under an Emergency Use Authorization (EUA).  This EUA will remain in effect (meaning this test can be used) for the duration of the COVID-19 declaration under Section 564(b)(1) of the Act, 21 U.S.C. section 360bbb-3(b)(1), unless the authorization is terminated or revoked sooner.  Performed at [facility], [address] [place], [place] [address]   MRSA PCR Screening     Status: None   Collection Time: 11/30/19  4:44 AM   Specimen: Nasopharyngeal  Result Value Ref Range Status   MRSA by PCR NEGATIVE NEGATIVE Final    Comment:        The GeneXpert MRSA Assay (FDA approved for NASAL specimens only), is one component of a comprehensive MRSA colonization surveillance program. It is not intended to diagnose MRSA infection nor to guide or monitor treatment for MRSA infections. Performed at [facility], [address] [place], [place] [address]   CULTURE, BLOOD (ROUTINE X 2) w Reflex to ID Panel     Status: None (Preliminary result)   Collection Time: 11/30/19  5:33 AM   Specimen:  Left Antecubital; Blood  Result Value Ref Range Status   Specimen Description LEFT ANTECUBITAL  Final   Special Requests   Final    BOTTLES DRAWN AEROBIC AND ANAEROBIC Blood Culture adequate volume   Culture   Final    NO GROWTH 3 DAYS Performed at [facility], [address], [place], [place] [address]    Report Status PENDING  Incomplete  CULTURE, BLOOD (ROUTINE X 2) w Reflex to ID Panel     Status: None (Preliminary result)   Collection Time: 11/30/19  5:42 AM   Specimen: BLOOD LEFT FOREARM  Result Value Ref Range Status   Specimen Description BLOOD LEFT FOREARM  Final   Special Requests   Final    BOTTLES DRAWN AEROBIC AND ANAEROBIC Blood Culture adequate volume   Culture   Final    NO GROWTH 3 DAYS Performed at [facility], [address], [place], [place] [address]    Report Status PENDING  Incomplete  Culture, respiratory     Status: None   Collection Time: 11/30/19 11:18 AM   Specimen: Tracheal Aspirate; Respiratory  Result Value Ref Range Status   Specimen Description   Final    TRACHEAL ASPIRATE Performed at [facility], [address], [place], [place] [address]    Special Requests   Final  NONE Performed at [facility], [address] [place], [place] [address]    Gram Stain   Final    FEW WBC PRESENT, PREDOMINANTLY PMN FEW GRAM POSITIVE COCCI RARE YEAST    Culture   Final    FEW Consistent with normal respiratory flora. Performed at [facility], [place] [address] [place], [place] [address]    Report Status 12/02/2019 FINAL  Final         BMP Latest Ref Rng & Units 12/03/2019 12/02/2019 12/01/2019  Glucose 70 - 99 mg/dL 140(H) 125(H) 185(H)  BUN 8 - 23 mg/dL 30(H) 28(H) 32(H)  Creatinine 0.61 - 1.24 mg/dL 1.62(H) 1.56(H) 1.52(H)  Sodium 135 - 145 mmol/L 141 139 136  Potassium 3.5 - 5.1 mmol/L 4.0 3.4(L) 3.9  Chloride 98 - 111 mmol/L 108 105 104  CO2 22 - 32 mmol/L '28 26 25  ' Calcium 8.9 - 10.3 mg/dL  8.4(L) 8.6(L) 9.4       Indwelling Urinary Catheter continued, requirement due to   Reason to continue Indwelling Urinary Catheter strict Intake/Output monitoring for hemodynamic instability          Ventilator continued, requirement due to severe respiratory failure   Ventilator Sedation RASS 0 to -2      ASSESSMENT AND PLAN SYNOPSIS  85 yo WM admitted for severe resp failure and acute encephalopathy and aspiration pneumonia and sepsis with seizures  Severe ACUTE Hypoxic and Hypercapnic Respiratory Failure -continue Full MV support -continue Bronchodilator Therapy -Wean Fio2 and PEEP as tolerated -will perform SAT/SBT when respiratory parameters are met -VAP/VENT bundle implementation    ACUTE KIDNEY INJURY/Renal Failure -continue Foley Catheter-assess need -Avoid nephrotoxic agents -Follow urine output, BMP -Ensure adequate renal perfusion, optimize oxygenation -Renal dose medications  ACUTE NSTEMI  -follow up cardiac enzymes as indicated -follow up cardiology recs     NEUROLOGY Acute toxic metabolic encephalopathy, need for sedation Goal RASS -2 to -3 Follow up [place] ICU monitoring  ID Dx ASPIRATION PNEUMONIA -continue IV abx as prescibed -follow up cultures  GI GI PROPHYLAXIS as indicated  NUTRITIONAL STATUS Nutrition Status: Nutrition Problem: Inadequate oral intake Etiology: inability to eat (pt sedated and ventilated) Signs/Symptoms: NPO status     DIET-->TF's as tolerated Constipation protocol as indicated  ENDO - will use ICU hypoglycemic\Hyperglycemia protocol if indicated     ELECTROLYTES -follow labs as needed -replace as needed -pharmacy consultation and following   DVT/GI PRX ordered and assessed TRANSFUSIONS AS NEEDED MONITOR FSBS I Assessed the need for Labs I Assessed the need for Foley I Assessed the need for Central Venous Line Family Discussion when available I Assessed the need for  Mobilization I made an Assessment of medications to be adjusted accordingly Safety Risk assessment completed   CASE DISCUSSED IN MULTIDISCIPLINARY ROUNDS WITH ICU TEAM  Critical Care Time devoted to patient care services described in this note is 32 minutes.   Overall, patient is critically ill, prognosis is guarded.  Patient with Multiorgan failure and at high risk for cardiac arrest and death.    [name], M.D.  [facility]  Medical Director Wanda Director [facility] Cardio-Pulmonary Department

## 2019-12-03 NOTE — Progress Notes (Signed)
Subjective: Failed attempts at extubation on yesterday.  Currently intubated and sedated.    Objective: Current vital signs: BP (!) 175/73   Pulse (!) 58   Temp 98.2 F (36.8 C)   Resp 18   Ht '5\' 7"'  (1.702 m)   Wt 83.4 kg   SpO2 100%   BMI 28.80 kg/m  Vital signs in last 24 hours: Temp:  [97.7 F (36.5 C)-99.9 F (37.7 C)] 98.2 F (36.8 C) (07/12 1100) Pulse Rate:  [32-75] 58 (07/12 1100) Resp:  [18-25] 18 (07/12 1100) BP: (97-177)/(47-74) 175/73 (07/12 1100) SpO2:  [94 %-100 %] 100 % (07/12 1100) FiO2 (%):  [40 %] 40 % (07/12 0800)  Intake/Output from previous day: 07/11 0701 - 07/12 0700 In: 1025.4 [I.V.:216.5; NG/GT:120; IV Piggyback:688.9] Out: 1800 [Urine:1800] Intake/Output this shift: Total I/O In: 257.8 [I.V.:57.8; IV Piggyback:200] Out: 625 [Urine:625] Nutritional status:  Diet Order            Diet NPO time specified  Diet effective now                 Neurologic Exam: Mental Status: Patient does not respond to verbal stimuli.Grimacesto deep sternal rub and attempts to open eyes.  Attempts to localize to pain. Does not follow commands. No verbalizations noted.  Cranial Nerves: II: patient does not respond confrontation bilaterally, pupils right70m, left 362mand reactivebilaterally III,IV,VI: Oculocephalic response present bilaterally.  V,VII: corneal reflexpresentbilaterally VIII: patient does not respond to verbal stimuli IX,X: gag reflexreduced, XI: trapezius strength unable to test bilaterally XII: tongue strength unable to test Motor: Attempts to localize to pain with the right upper extremity.  Minimal movement noted with the LUE.   Sensory: Grimacesto noxious stimuli inallextremities.  Lab Results: Basic Metabolic Panel: Recent Labs  Lab 11/30/19 0109 11/30/19 0533 11/30/19 0841 11/30/19 0841 11/30/19 1306 11/30/19 1306 12/01/19 0422 12/02/19 0415 12/03/19 0330  NA 137   < > 14655--  DUPLICATE REQUEST  14374--   136 139 141  K 4.0   < > 4.0  --  DUPLICATE REQUEST  4.4  --  3.9 3.4* 4.0  CL 99   < > 10827--  DUPLICATE REQUEST  10078--  104 105 108  CO2 22   < > 24  --  DUPLICATE REQUEST  25  --  '25 26 28  ' GLUCOSE 679*   < > 44675 --  DUPLICATE REQUEST  22449 --  185* 125* 140*  BUN 28*   < > 30*  --  DUPLICATE REQUEST  32*  --  32* 28* 30*  CREATININE 1.72*   < > 1.2.01 --  DUPLICATE REQUEST  1.0.07 --  1.52* 1.56* 1.62*  CALCIUM 10.5*   < > 1112.1  < > DUPLICATE REQUEST  1097.5 < > 9.4 8.6* 8.4*  MG 2.0  --   --   --   --   --  1.9 1.9 2.3  PHOS  --   --   --   --   --   --   --  2.9  --    < > = values in this interval not displayed.    Liver Function Tests: Recent Labs  Lab 11/30/19 0109 12/01/19 0422 12/02/19 0415 12/03/19 0330  AST 43* '26 24 23  ' ALT '28 23 24 21  ' ALKPHOS 172* 81 84 90  BILITOT 1.3* 0.9 1.0 1.2  PROT 7.9 5.6* 5.3* 5.6*  ALBUMIN 3.5 2.5* 2.6* 2.5*   No results for input(s): LIPASE, AMYLASE in the last 168 hours. No results for input(s): AMMONIA in the last 168 hours.  CBC: Recent Labs  Lab 11/30/19 0109 12/01/19 0612 12/02/19 0415 12/03/19 0330  WBC 21.8* 17.2* 11.8* 13.2*  NEUTROABS 15.1* 14.1* 8.8* 10.7*  HGB 17.6* 12.7* 12.3* 12.7*  HCT 52.3* 36.9* 35.7* 37.9*  MCV 86.9 85.8 86.2 86.7  PLT 241 204 197 217    Cardiac Enzymes: No results for input(s): CKTOTAL, CKMB, CKMBINDEX, TROPONINI in the last 168 hours.  Lipid Panel: Recent Labs  Lab 11/30/19 0325 11/30/19 0841 12/03/19 0330  CHOL  --  253*  --   TRIG 174*  --  147    CBG: Recent Labs  Lab 12/02/19 1925 12/02/19 2354 12/03/19 0337 12/03/19 0729 12/03/19 1111  GLUCAP 179* 128* 130* 100* 108*    Microbiology: Results for orders placed or performed during the hospital encounter of 11/30/19  SARS Coronavirus 2 by RT PCR (hospital order, performed in [facility] hospital lab) Nasopharyngeal Nasopharyngeal Swab     Status: None   Collection Time: 11/30/19  2:54 AM   Specimen:  Nasopharyngeal Swab  Result Value Ref Range Status   SARS Coronavirus 2 NEGATIVE NEGATIVE Final    Comment: (NOTE) SARS-CoV-2 target nucleic acids are NOT DETECTED.  The SARS-CoV-2 RNA is generally detectable in upper and lower respiratory specimens during the acute phase of infection. The lowest concentration of SARS-CoV-2 viral copies this assay can detect is 250 copies / mL. A negative result does not preclude SARS-CoV-2 infection and should not be used as the sole basis for treatment or other patient management decisions.  A negative result may occur with improper specimen collection / handling, submission of specimen other than nasopharyngeal swab, presence of viral mutation(s) within the areas targeted by this assay, and inadequate number of viral copies (<250 copies / mL). A negative result must be combined with clinical observations, patient history, and epidemiological information.  Fact Sheet for Patients:   StrictlyIdeas.no  Fact Sheet for Healthcare Providers: BankingDealers.co.za  This test is not yet approved or  cleared by the [place] FDA and has been authorized for detection and/or diagnosis of SARS-CoV-2 by FDA under an Emergency Use Authorization (EUA).  This EUA will remain in effect (meaning this test can be used) for the duration of the COVID-19 declaration under Section 564(b)(1) of the Act, 21 U.S.C. section 360bbb-3(b)(1), unless the authorization is terminated or revoked sooner.  Performed at [facility], [address] [place], [place] [address]   MRSA PCR Screening     Status: None   Collection Time: 11/30/19  4:44 AM   Specimen: Nasopharyngeal  Result Value Ref Range Status   MRSA by PCR NEGATIVE NEGATIVE Final    Comment:        The GeneXpert MRSA Assay (FDA approved for NASAL specimens only), is one component of a comprehensive MRSA colonization surveillance program. It is  not intended to diagnose MRSA infection nor to guide or monitor treatment for MRSA infections. Performed at [facility], [address] [place], [place] [address]   CULTURE, BLOOD (ROUTINE X 2) w Reflex to ID Panel     Status: None (Preliminary result)   Collection Time: 11/30/19  5:33 AM   Specimen: Left Antecubital; Blood  Result Value Ref Range Status   Specimen Description LEFT ANTECUBITAL  Final   Special Requests   Final    BOTTLES DRAWN  AEROBIC AND ANAEROBIC Blood Culture adequate volume   Culture   Final    NO GROWTH 3 DAYS Performed at [facility], [address] [place], [place] [address]    Report Status PENDING  Incomplete  CULTURE, BLOOD (ROUTINE X 2) w Reflex to ID Panel     Status: None (Preliminary result)   Collection Time: 11/30/19  5:42 AM   Specimen: BLOOD LEFT FOREARM  Result Value Ref Range Status   Specimen Description BLOOD LEFT FOREARM  Final   Special Requests   Final    BOTTLES DRAWN AEROBIC AND ANAEROBIC Blood Culture adequate volume   Culture   Final    NO GROWTH 3 DAYS Performed at [facility], [address] [place], [place] [address]    Report Status PENDING  Incomplete  Culture, respiratory     Status: None   Collection Time: 11/30/19 11:18 AM   Specimen: Tracheal Aspirate; Respiratory  Result Value Ref Range Status   Specimen Description   Final    TRACHEAL ASPIRATE Performed at [facility], [address] [place], [place] [address]    Special Requests   Final    NONE Performed at [facility], [address] [place], [place] [address]    Gram Stain   Final    FEW WBC PRESENT, PREDOMINANTLY PMN FEW GRAM POSITIVE COCCI RARE YEAST    Culture   Final    FEW Consistent with normal respiratory flora. Performed at [facility], [address], [place], [place] [address]    Report Status 12/02/2019 FINAL  Final    Coagulation Studies: No results for input(s): LABPROT,  INR in the last 72 hours.  Imaging: No results found.  Medications:  I have reviewed the patient's current medications. Scheduled: . chlorhexidine gluconate (MEDLINE KIT)  15 mL Mouth Rinse BID  . Chlorhexidine Gluconate Cloth  6 each Topical Daily  . docusate  100 mg Oral BID  . free water  200 mL Per Tube Q4H  . insulin aspart  0-15 Units Subcutaneous Q4H  . insulin glargine  12 Units Subcutaneous QHS  . mouth rinse  15 mL Mouth Rinse 10 times per day  . metoprolol tartrate  12.5 mg Oral BID  . polyethylene glycol  17 g Oral Daily  . rivaroxaban  15 mg Oral Q supper    Assessment/Plan: 84 y.o.malewith a history of A. fib on Xarelto, diastolic CHF, CKD, diabetes, bilateral BKA's, peripheral vascular disease, sleep apnea who presents for hyperglycemia and syncope. Had witnessed seizure receiving 27m of Ativan. Currently intubated and sedated. Patient with no documented history of seizures. Suspect seizure activity was provoked due decreased seizure threshold in the setting of prior stroke with current multiple medical issues including metabolic abnormalities, infection and hyperglycemia. Head CT personally reviewed and shows no acute changes.EEG shows a burst suppression rhythm consistent with medications. Patient with some improvement from initial presentation but unable to be extubated.  Some question of focality on neurological examination.    Recommendations: 1.Seizure precautions 2. MRI of the brain without contrast    LOS: 3 days   LAlexis Goodell MD Neurology [phone]/04/2020  12:45 PM

## 2019-12-03 NOTE — Progress Notes (Addendum)
Nutrition Follow Up Note   DOCUMENTATION CODES:   Not applicable  INTERVENTION:   Vital AF 1.2 @65ml /hr- Initiate at 76ml/hr and increase by 35ml/hr q 8 hours until goal rate is reached.   Free water flushes 253ml q4 hours per MD  Regimen will provide 1872kcal/day, 117g/day protein and 2415ml/day free water   Pt is at refeed risk; recommend monitor K, Mg and P labs daily until stable   Daily weights   NUTRITION DIAGNOSIS:   Inadequate oral intake related to inability to eat (pt sedated and ventilated) as evidenced by NPO status.  GOAL:   Provide needs based on ASPEN/SCCM guidelines  MONITOR:   Vent status, Labs, Weight trends, Skin, I & O's, tube feeds  REASON FOR ASSESSMENT:   Consult Enteral/tube feeding initiation and management  ASSESSMENT:   85 year old male with a past medical history significant for atrial fibrillation on Xarelto, HFpEF, CKD, diabetes mellitus, bilateral BKA's, PVD, sleep apnea, and COVID-19 infection in September 2020 who presents to [facility] ED on 11/30/2019 due to syncope and hyperglycemia. Pt found to have hyperosmolar hyperglycemia and aspiration PNA  Pt sedated and ventilated. OGT in place. Will plan to start tube feeds today. Pt is at refeed risk.   Medications reviewed and include: colace, mirialx, NaCl @75ml /hr, albumin, unasyn, pepcid, insulin  Labs reviewed: BUN 30(H), creat 1.62(H) Wbc- 13.2(H)  Patient is currently intubated on ventilator support MV: 11.8 L/min Temp (24hrs), Avg:98.7 F (37.1 C), Min:97.7 F (36.5 C), Max:99.5 F (37.5 C)  Propofol: none   MAP- >59mmHg  UOP- 1836ml  Diet Order:   Diet Order            Diet NPO time specified  Diet effective now                EDUCATION NEEDS:   No education needs have been identified at this time  Skin:  Skin Assessment: Reviewed RN Assessment  Last BM:  pta  Height:   Ht Readings from Last 1 Encounters:  11/30/19 5\' 7"  (1.702 m)    Weight:   Wt  Readings from Last 1 Encounters:  12/02/19 83.4 kg    Ideal Body Weight:  59.2 kg (adjusted for b/l BKAs)  BMI:  Body mass index is 28.8 kg/m.  Estimated Nutritional Needs:   Kcal:  1831kcal/day  Protein:  100-115g/day  Fluid:  1.5-1.8L/day  Koleen Distance MS, RD, LDN Please refer to [facility] for RD and/or RD on-call/weekend/after hours pager

## 2019-12-03 NOTE — Consult Note (Signed)
[place] for Electrolyte Monitoring and Replacement   Recent Labs: Potassium (mmol/L)  Date Value  12/03/2019 4.0  01/20/2013 3.5   Magnesium (mg/dL)  Date Value  12/03/2019 2.3   Calcium (mg/dL)  Date Value  12/03/2019 8.4 (L)   Calcium, Total (mg/dL)  Date Value  01/20/2013 9.4   Albumin (g/dL)  Date Value  12/03/2019 2.5 (L)  01/19/2013 3.7   Phosphorus (mg/dL)  Date Value  12/02/2019 2.9   Sodium (mmol/L)  Date Value  12/03/2019 141  01/20/2013 140   Corrected Ca: 9.6 mg/dL  Assessment:85 year old male with a past medical history significant for atrial fibrillation on Xarelto, HFpEF, CKD, diabetes mellitus, bilateral BKA's, PVD, sleep apnea, and COVID-19 infection in September 2020 who presents to [facility] ED on 11/30/2019 due to a seizure  Goal of Therapy:  Potassium 4.0 - 5.1 mmol/L Magnesium 2.0 - 2.4 mg/dL All Other Electrolytes WNL  Plan:   Electrolyte abnormalities have resolved with repletion yesterday. No further supplementation indicated at this time  Re-check electrolytes in AM 7/13  Benita Gutter 12/03/2019 8:06 AM

## 2019-12-03 NOTE — Progress Notes (Signed)
Blood pressure noted to be elevated, MD notified, no new orders given.

## 2019-12-03 NOTE — Consult Note (Signed)
Consultation Note Date: 12/03/2019   Patient Name: [name]  DOB: September 09, 1934  MRN: [id number]  Age / Sex: 85 y.o., male  PCP: Corey Skains, MD Referring Physician: Flora Lipps, MD  Reason for Consultation: Establishing goals of care and Psychosocial/spiritual support  HPI/Patient Profile: 85 y.o. male  with past medical history of A. fib, prostate cancer, seizure disorder, CKD, DM, HTN/HLD, PVD, sleep apnea uses CPAP, ulcer of left foot due to DM, arthritis, admitted on 11/30/2019 with severe acute hypoxic and hypercapnic respiratory failure, acute kidney injury.   Clinical Assessment and Goals of Care: I have reviewed medical records including EPIC notes, labs and imaging, received report from bedside nursing staff, examined the patient.    Call to son, [name] to discuss diagnosis prognosis, [place], EOL wishes, disposition and options, left voicemail message. Call to spouse, [name] who tells me that she is 85 years old, does not drive.  She prefers that we speak with their son [name].  I ask if [name] has legal guardianship, or just healthcare power of attorney, has [name] been to the court to get guardianship.  Ms. [name] tells me she is unsure.  I reassure her that we are caring for her husband, and there has been no changes.   HCPOA   NEXT OF KIN - son [name] is main contact person per spouse, [name].  She is unable to tell me if he has legal guardianship.   SUMMARY OF RECOMMENDATIONS   Full scope/full code. Continue to treat the treatable PMT to reach out to family   Code Status/Advance Care Planning:  Full code - No code status discussions today.   Symptom Management:   Per hospitalist, no additional needs at this time.  Palliative Prophylaxis:   Oral Care and Palliative Wound Care  Additional Recommendations (Limitations, Scope,  Preferences):  Full Scope Treatment  Psycho-social/Spiritual:   Desire for further Chaplaincy support:no  Additional Recommendations: Caregiving  Support/Resources  Prognosis:   Unable to determine, based on outcomes.  Discharge Planning: To be determined, based on outcomes.      Primary Diagnoses: Present on Admission: **None**   I have reviewed the medical record, interviewed the patient and family, and examined the patient. The following aspects are pertinent.  Past Medical History:  Diagnosis Date  . Arthritis   . Atrial fibrillation ([place])   . Carcinoma of prostate ([place])   . CHF (congestive heart failure) ([place])   . Chronic kidney disease   . Diabetes mellitus without complication ([place])   . ED (erectile dysfunction)   . Frequent urination   . Hyperlipidemia   . Hypertension   . Moderate mitral insufficiency   . Peripheral vascular disease ([place])   . Pneumonia 04/2016  . Prostate cancer ([place])   . Sleep apnea    OSA--USE C-PAP  . Ulcer of left foot due to type 2 diabetes mellitus ([place])   . Urinary stress incontinence, male    Social History   Socioeconomic History  . Marital status: Married  Spouse name: Not on file  . Number of children: Not on file  . Years of education: Not on file  . Highest education level: Not on file  Occupational History  . Occupation: retired  Tobacco Use  . Smoking status: Former Smoker    Packs/day: 0.25    Types: Cigarettes    Quit date: 10/14/1994    Years since quitting: 25.1  . Smokeless tobacco: Never Used  Vaping Use  . Vaping Use: Never used  Substance and Sexual Activity  . Alcohol use: No    Alcohol/week: 0.0 standard drinks  . Drug use: No  . Sexual activity: Not on file  Other Topics Concern  . Not on file  Social History Narrative   Lives at home with wife, has a wheelchair   Social Determinants of Health   Financial Resource Strain:   . Difficulty of Paying Living Expenses:   Food Insecurity:   .  Worried About Charity fundraiser in the Last Year:   . Arboriculturist in the Last Year:   Transportation Needs:   . Film/video editor (Medical):   Marland Kitchen Lack of Transportation (Non-Medical):   Physical Activity:   . Days of Exercise per Week:   . Minutes of Exercise per Session:   Stress:   . Feeling of Stress :   Social Connections:   . Frequency of Communication with Friends and Family:   . Frequency of Social Gatherings with Friends and Family:   . Attends Religious Services:   . Active Member of Clubs or Organizations:   . Attends Archivist Meetings:   Marland Kitchen Marital Status:    Family History  Problem Relation Age of Onset  . Hypertension Mother   . Diabetes Mother   . Prostate cancer Neg Hx   . Bladder Cancer Neg Hx   . Kidney disease Neg Hx    Scheduled Meds: . chlorhexidine gluconate (MEDLINE KIT)  15 mL Mouth Rinse BID  . Chlorhexidine Gluconate Cloth  6 each Topical Daily  . docusate  100 mg Oral BID  . free water  200 mL Per Tube Q4H  . insulin aspart  0-15 Units Subcutaneous Q4H  . insulin glargine  12 Units Subcutaneous QHS  . mouth rinse  15 mL Mouth Rinse 10 times per day  . metoprolol tartrate  12.5 mg Oral BID  . polyethylene glycol  17 g Oral Daily  . rivaroxaban  15 mg Oral Q supper   Continuous Infusions: . sodium chloride 75 mL/hr at 11/30/19 1119  . albumin human 12.5 g (12/03/19 1040)  . ampicillin-sulbactam (UNASYN) IV 3 g (12/03/19 1316)  . dextrose 5 % and 0.45% NaCl Stopped (11/30/19 1836)  . famotidine (PEPCID) IV 20 mg (12/03/19 1042)  . levETIRAcetam Stopped (12/03/19 0949)  . propofol (DIPRIVAN) infusion 12 mcg/kg/min (12/03/19 1000)   PRN Meds:.dextrose, docusate sodium, fentaNYL (SUBLIMAZE) injection, fentaNYL (SUBLIMAZE) injection, ipratropium-albuterol, midazolam, midazolam, polyethylene glycol Medications Prior to Admission:  Prior to Admission medications   Medication Sig Start Date End Date Taking? Authorizing Provider   amiodarone (PACERONE) 200 MG tablet Take 200 mg by mouth daily. 09/13/18  Yes [provider]  aspirin EC 81 MG tablet Take 1 tablet (81 mg total) by mouth daily. 12/18/18  Yes Algernon Huxley, MD  atorvastatin (LIPITOR) 80 MG tablet Take 80 mg by mouth every evening. 07/31/18  Yes [provider]  bisacodyl (DULCOLAX) 5 MG EC tablet Take 1 tablet (5 mg  total) by mouth daily as needed for moderate constipation. 01/05/19  Yes [name], MD  chlorthalidone (HYGROTON) 25 MG tablet Take 25 mg by mouth daily.   Yes [provider]  Cholecalciferol (VITAMIN D-3) 25 MCG (1000 UT) CAPS Take by mouth.   Yes [provider]  diltiazem (CARDIZEM CD) 120 MG 24 hr capsule Take 120 mg by mouth daily. 09/28/18  Yes [provider]  divalproex (DEPAKOTE ER) 500 MG 24 hr tablet Take 1 tablet (500 mg total) by mouth daily. 02/16/19  Yes Charlynne Cousins, MD  ferrous sulfate 325 (65 FE) MG tablet Take 1 tablet (325 mg total) by mouth daily with breakfast. 10/18/18  Yes [name] Mandes, MD  gabapentin (NEURONTIN) 300 MG capsule Take 300 mg by mouth 3 (three) times daily.  02/07/19  Yes [provider]  insulin glargine (LANTUS) 100 UNIT/ML injection Inject 0.15 mLs (15 Units total) into the skin at bedtime. 10/18/18  Yes [name] Mandes, MD  insulin lispro (HUMALOG) 100 UNIT/ML injection Inject 0.03 mLs (3 Units total) into the skin 3 (three) times daily with meals. Patient taking differently: Inject 3 Units into the skin 3 (three) times daily with meals. Plus 1 additional unit for every 50 BG points over 200 (max 5 additional units per dose) 11/28/17  Yes Wieting, Richard, MD  isosorbide mononitrate (IMDUR) 60 MG 24 hr tablet Take 1 tablet (60 mg total) by mouth daily. 04/06/19  Yes Arta Silence, MD  Multiple Vitamin (MULTIVITAMIN WITH MINERALS) TABS tablet Take 1 tablet by mouth daily. 01/05/19  Yes [name], MD  Rivaroxaban (XARELTO) 15 MG TABS tablet Take 1 tablet (15 mg  total) by mouth daily with supper. Hold Xarelto for 3 days.  Start on January 09, 2019. 01/09/19  Yes [name], MD  senna (SENOKOT) 8.6 MG TABS tablet Take 1 tablet (8.6 mg total) by mouth daily as needed for mild constipation. 01/05/19  Yes [name], MD  acetaminophen (TYLENOL) 325 MG tablet Take 650 mg by mouth every 6 (six) hours as needed.    [provider]  collagenase (SANTYL) ointment Apply 1 application topically daily. Patient not taking: Reported on 06/24/2019 04/16/19   Kris Hartmann, NP  doxycycline (VIBRAMYCIN) 100 MG capsule Take 100 mg by mouth 2 (two) times daily.    [provider]  Ensure Max Protein (ENSURE MAX PROTEIN) LIQD Take 330 mLs (11 oz total) by mouth 2 (two) times daily. 01/05/19   [name], MD  HYDROcodone-acetaminophen (NORCO/VICODIN) 5-325 MG tablet Take 1 tablet by mouth every 6 (six) hours as needed for moderate pain (pain score 4-6). Patient not taking: Reported on 06/24/2019 02/19/19   Charlynne Cousins, MD  metFORMIN (GLUCOPHAGE) 500 MG tablet Take by mouth daily.    [provider]  metoprolol succinate (TOPROL-XL) 25 MG 24 hr tablet Take 25 mg by mouth daily.    [provider]  metoprolol tartrate (LOPRESSOR) 25 MG tablet Take 25 mg by mouth 3 (three) times daily. 04/08/19   [provider]  nitroGLYCERIN (NITROSTAT) 0.4 MG SL tablet Place 1 tablet (0.4 mg total) under the tongue every 5 (five) minutes as needed for chest pain. 06/09/18   Hillary Bow, MD  NOVOLOG FLEXPEN 100 UNIT/ML FlexPen  09/12/19   [provider]  potassium chloride (KLOR-CON) 20 MEQ packet Take by mouth 2 (two) times daily.    [provider]  silver sulfADIAZINE (SILVADENE) 1 % cream Apply 1 application topically daily. Patient not  taking: Reported on 12/03/2019 01/30/19   Kris Hartmann, NP  vitamin C (VITAMIN C) 250 MG tablet Take 1 tablet (250 mg total) by mouth 2 (two) times daily. Patient not taking: Reported on  06/24/2019 01/05/19   [name], MD  zinc sulfate 220 (50 Zn) MG capsule Take 220 mg by mouth daily.    [provider]   Allergies  Allergen Reactions  . Contrast Media [Iodinated Diagnostic Agents] Shortness Of Breath    SOB  . Iohexol Shortness Of Breath     Desc: Respiratory Distress, Laryngedema, diaphoresis, Onset Date: [id number]   . Metrizamide Shortness Of Breath    SOB SOB SOB   . Latex Rash   Review of Systems  Unable to perform ROS: Intubated    Physical Exam Vitals and nursing note reviewed.  Constitutional:      General: He is not in acute distress.    Appearance: He is ill-appearing.  Cardiovascular:     Rate and Rhythm: Normal rate.  Pulmonary:     Comments: Intubated/ventilated  Abdominal:     Palpations: Abdomen is soft.  Musculoskeletal:        General: No swelling.  Skin:    General: Skin is warm and dry.  Neurological:     Comments: Intubated      Vital Signs: BP (!) 175/73   Pulse (!) 58   Temp 98.2 F (36.8 C)   Resp 18   Ht _0  (1.702 m)   Wt 83.4 kg   SpO2 100%   BMI 28.80 kg/m  Pain Scale: CPOT   Pain Score: 0-No pain   SpO2: SpO2: 100 % O2 Device:SpO2: 100 % O2 Flow Rate: .   IO: Intake/output summary:   Intake/Output Summary (Last 24 hours) at 12/03/2019 1452 Last data filed at 12/03/2019 1127 Gross per 24 hour  Intake 1283.21 ml  Output 2425 ml  Net -1141.79 ml    LBM: Last BM Date:  (PTA) Baseline Weight: Weight: 80.7 kg Most recent weight: Weight: 83.4 kg     Palliative Assessment/Data:   Flowsheet Rows     Most Recent Value  Intake Tab  Referral Department Hospitalist  Unit at Time of Referral Cardiac/Telemetry Unit  Palliative Care Primary Diagnosis Pulmonary  Date Notified 12/03/19  Palliative Care Type Return patient Palliative Care  Reason for referral Clarify Goals of Care  Date of Admission 11/30/19  Date first seen by Palliative Care 12/03/19  # of days Palliative referral response time  0 Day(s)  # of days IP prior to Palliative referral 3  Clinical Assessment  Palliative Performance Scale Score 10%  Pain Max last 24 hours Not able to report  Pain Min Last 24 hours Not able to report  Dyspnea Max Last 24 Hours Not able to report  Dyspnea Min Last 24 hours Not able to report  Psychosocial & Spiritual Assessment  Palliative Care Outcomes      Time In: 1320 Time Out: 1350 Time Total: 30 minutes Greater than 50%  of this time was spent counseling and coordinating care related to the above assessment and plan.  Signed by: Drue Novel, NP   Please contact Palliative Medicine Team phone at [phone] for questions and concerns.  For individual provider: See Shea Evans

## 2019-12-03 NOTE — Progress Notes (Signed)
[facility] Encounter Note  Patient: [name] / Admit Date: 11/30/2019 / Date of Encounter: 12/03/2019, 12:45 PM   Subjective: No apparent significant change in current condition of the patient. Patient still intubated and oxygenating well with respirator. No further seizure activity apparent. Troponin level 39 most consistent with demand ischemia rather than acute coronary syndrome. Patient has had no further rhythm disturbances after intravenous amiodarone has converted patient to normal sinus rhythm and would continue orally if not contraindicated due to respiratory failure and or neurologic concerns Echocardiogram with normal LV systolic function with ejection fraction of 55% and no significant valvular heart disease causing above  Review of Systems: Cannot assess due to intubation  objective: Telemetry: Normal sinus rhythm Physical Exam: Blood pressure (!) 175/73, pulse (!) 58, temperature 98.2 F (36.8 C), resp. rate 18, height '5\' 7"'  (1.702 m), weight 83.4 kg, SpO2 100 %. Body mass index is 28.8 kg/m. General: Well developed, well nourished, in no acute distress. Head: Normocephalic, atraumatic, sclera non-icteric, no xanthomas, nares are without discharge. Neck: No apparent masses Lungs: Intubated on respirator with no wheezes, no rhonchi, no rales , no crackles   Heart: Regular rate and rhythm, normal S1 S2, no murmur, no rub, no gallop, PMI is normal size and placement, carotid upstroke normal without bruit, jugular venous pressure normal Abdomen: Soft,  non-distended with normoactive bowel sounds. No hepatosplenomegaly. Abdominal aorta is normal size without bruit Extremities: Bilateral BKA  peripheral: 2+ radial, 2+ femoral,   Neuro: Is not alert and oriented. Does not moves all extremities spontaneously. Psych: Does not responds to questions appropriately with a normal affect.   Intake/Output Summary (Last 24 hours) at 12/03/2019 1245 Last  data filed at 12/03/2019 1127 Gross per 24 hour  Intake 1283.21 ml  Output 2425 ml  Net -1141.79 ml    Inpatient Medications:  . chlorhexidine gluconate (MEDLINE KIT)  15 mL Mouth Rinse BID  . Chlorhexidine Gluconate Cloth  6 each Topical Daily  . docusate  100 mg Oral BID  . free water  200 mL Per Tube Q4H  . insulin aspart  0-15 Units Subcutaneous Q4H  . insulin glargine  12 Units Subcutaneous QHS  . mouth rinse  15 mL Mouth Rinse 10 times per day  . metoprolol tartrate  12.5 mg Oral BID  . polyethylene glycol  17 g Oral Daily  . rivaroxaban  15 mg Oral Q supper   Infusions:  . sodium chloride 75 mL/hr at 11/30/19 1119  . albumin human 12.5 g (12/03/19 1040)  . ampicillin-sulbactam (UNASYN) IV Stopped (12/03/19 0545)  . dextrose 5 % and 0.45% NaCl Stopped (11/30/19 1836)  . famotidine (PEPCID) IV 20 mg (12/03/19 1042)  . levETIRAcetam Stopped (12/03/19 0949)  . propofol (DIPRIVAN) infusion 12 mcg/kg/min (12/03/19 1000)    Labs: Recent Labs    12/02/19 0415 12/03/19 0330  NA 139 141  K 3.4* 4.0  CL 105 108  CO2 26 28  GLUCOSE 125* 140*  BUN 28* 30*  CREATININE 1.56* 1.62*  CALCIUM 8.6* 8.4*  MG 1.9 2.3  PHOS 2.9  --    Recent Labs    12/02/19 0415 12/03/19 0330  AST 24 23  ALT 24 21  ALKPHOS 84 90  BILITOT 1.0 1.2  PROT 5.3* 5.6*  ALBUMIN 2.6* 2.5*   Recent Labs    12/02/19 0415 12/03/19 0330  WBC 11.8* 13.2*  NEUTROABS 8.8* 10.7*  HGB 12.3* 12.7*  HCT 35.7* 37.9*  MCV 86.2  86.7  PLT 197 217   No results for input(s): CKTOTAL, CKMB, TROPONINI in the last 72 hours. Invalid input(s): POCBNP No results for input(s): HGBA1C in the last 72 hours.   Weights: Filed Weights   11/30/19 0444 12/01/19 0500 12/02/19 0500  Weight: 81.4 kg 83 kg 83.4 kg     Radiology/Studies:  EEG  Result Date: 11/30/2019 Alexis Goodell, MD     11/30/2019  4:20 PM ELECTROENCEPHALOGRAM REPORT Patient: [name]       Room #: IC06A-AA EEG No. ID: [id number] Age:  85 y.o.        Sex: male Requesting Physician: Lanney Gins Report Date:  11/30/2019       Interpreting Physician: Alexis Goodell History: Abdulah Iqbal is an 85 y.o. male s/p arrest and seizure Medications: Keppra, Unasyn, Insulin, Propofol, Vasopressin Conditions of Recording:  This is a 21 channel routine scalp EEG performed with bipolar and monopolar montages arranged in accordance to the international 10/20 system of electrode placement. One channel was dedicated to EKG recording. The patient is in the intubated and sedated state. Description:  The background activity is discontinuous. It consists of low voltage bursts of polyspike, spike and slow wave activity alternating with periods of attenuation. The burst activity lasts up to 4 seconds.  The periods of attenuation last up to 6 seconds. This discontinuous activity is maintained throughout the tracing. No activation procedures were performed.  Hyperventilation and intermittent photic stimulation were not performed. IMPRESSION: This is an abnormal EEG due to a burst-suppression pattern seen throughout the tracing.  Burst suppression pattern can be seen in a variety of circumstances, including anesthesia, drug intoxication, hypothermia, as well as cerebral anoxia.  Clinical/neurological, and radiographic correlation advised.  Alexis Goodell, MD Neurology [phone] 11/30/2019, 4:16 PM   CT ABDOMEN PELVIS WO CONTRAST  Result Date: 11/30/2019 CLINICAL DATA:  Found down with seizure-like activity, V-tach, seizure while in ED EXAM: CT HEAD WITHOUT CONTRAST CT CERVICAL SPINE WITHOUT CONTRAST CT CHEST, ABDOMEN AND PELVIS WITHOUT CONTRAST TECHNIQUE: Contiguous axial images were obtained from the base of the skull through the vertex without intravenous contrast. Multidetector CT imaging of the cervical spine was performed without intravenous contrast. Multiplanar CT image reconstructions were also generated. Multidetector CT imaging of the chest, abdomen and  pelvis was performed following the standard protocol without IV contrast. COMPARISON:  CT head 09/16/2019, MR 06/05/2018, same day chest radiograph, CT abdomen pelvis 09/17/2006 FINDINGS: CT HEAD FINDINGS Brain: No evidence of acute infarction, hemorrhage, hydrocephalus, extra-axial collection or mass lesion/mass effect. Symmetric prominence of the ventricles, cisterns and sulci compatible with parenchymal volume loss. Patchy areas of white matter hypoattenuation are most compatible with chronic microvascular angiopathy. Vascular: Atherosclerotic calcification of the carotid siphons and intradural vertebral arteries. No hyperdense vessel. Skull: No acute scalp swelling or hematoma. No calvarial fracture or other acute or suspicious osseous lesion. Chronic left occipital scalp infiltration, likely scarring. Small right frontal scalp lipoma, stable. Sinuses/Orbits: Paranasal sinuses and mastoid air cells are predominantly clear. Pneumatization of the petrous apices. Orbital structures are unremarkable aside from prior lens extractions. Other: Extensive debris in the external auditory canals. Mild bilateral TMJ arthrosis. Largely edentulous with mild mandibular prognathism. CT CERVICAL FINDINGS Alignment: Stabilization collar is absent. Mild straightening of the upper cervical lordosis with reversal at the C4-5 levels. Mild retrolisthesis C4 on 5 and C5 on 6 is likely degenerative in nature. No evidence of traumatic listhesis. No abnormally widened, perched or jumped facets. Normal alignment of the craniocervical and  atlantoaxial articulations accounting for leftward cranial rotation. Skull base and vertebrae: No acute skull base fracture. No vertebral fracture or height loss. Arthrosis present at the atlantodental interval with some mild calcific pannus formation. Multilevel spondylitic changes are detailed below. Soft tissues and spinal canal: No pre or paravertebral fluid or swelling. No visible canal hematoma.  Enthesopathic changes in the nuchal ligament. Disc levels: Multilevel intervertebral disc height loss with spondylitic endplate changes. Features maximal C4-5 and C5-6 with small posterior disc osteophyte complexes resulting in at most mild canal stenosis. Uncinate spurring facet hypertrophic changes are maximal at these levels as well with moderate to severe stenosis on the left at C5-6 and more moderate changes on the right at C4-5. Additional multilevel foraminal narrowing is mild. Other: Secretions noted in the posterior naso and oropharynx above the level of the inflated endotracheal balloon. Extensive cervical carotid atherosclerosis. No concerning thyroid nodules. CT CHEST FINDINGS Cardiovascular: Limited assessment of the vasculature in the absence of contrast media. Cardiac size within normal limits. Small pericardial effusion. Extensive three-vessel coronary artery disease. Extensive calcifications upon the aortic valve leaflets. Atherosclerotic plaque within the normal caliber aorta. Shared origin of brachiocephalic and left common carotid artery origins. Calcific plaque throughout the proximal great vessels. Central pulmonary arteries are borderline enlarged. Luminal evaluation precluded in the absence of contrast media. Mediastinum/Nodes: No mediastinal fluid or gas. Normal thyroid gland and thoracic inlet. No worrisome mediastinal or axillary adenopathy. Hilar nodal evaluation is limited in the absence of intravenous contrast media. Endotracheal tube terminates in the mid to lower trachea, 3 cm from the carina. Transesophageal tube tip terminates in the gastric body with side port distal to the GE junction. Lungs/Pleura: Heterogeneous dependent areas of mixed ground-glass and dense consolidative opacities are present in both lungs with multiple fluid-filled dependent airways as well as interlobular septal thickening and fissural thickening and small bilateral pleural effusions with further atelectatic  changes and volume loss as well. No pneumothorax. Musculoskeletal: Posterior left third through sixth left rib fractures. Posterior right rib 4 rib fractures. No other acute osseous injuries in the chest wall. Asymmetric sternoclavicular arthrosis, increased on the right. Degenerative changes in both shoulders. Multilevel degenerative changes present spine Multilevel flowing anterior osteophytosis, compatible with features of diffuse idiopathic skeletal hyperostosis (DISH). Mild body wall edema. CT ABDOMEN AND PELVIS FINDINGS Hepatobiliary: No visible hepatic injury or concerning liver lesion on this unenhanced CT. No perihepatic hematoma. Smooth surface contour. Normal attenuation. Patient is post cholecystectomy. Slight prominence of the biliary tree likely related to post cholecystectomy reservoir effect without visible calcified intraductal gallstones. Pancreas: Moderate pancreatic atrophy and partial fatty replacement of the pancreas. No ductal dilatation, discernible lesions, or inflammation. Spleen: No direct splenic injury. No perisplenic hematoma. Normal splenic size. No concerning lesions. Few scattered vascular calcifications. Adrenals/Urinary Tract: Mild lobular thickening of the adrenal gland could reflect some senescent hyperplasia without concerning nodules. No evidence of adrenal hemorrhage. No direct renal injury or perinephric hematoma. Bilateral renal atrophy with mild symmetric perinephric stranding, possibly related to senescent change or diminished renal function. Few vascular calcifications are present in both kidneys. Collecting system calculus seen in the upper pole right kidney. No obstructive urolithiasis is evident. Hyperdense 18 mm cystic structure arising from the posterior interpolar right kidney (3/72) additional subcentimeter hyperdense cystic structure arising from the upper pole left kidney (3/65). No other concerning renal lesions. Urinary bladder is largely decompressed by Foley  catheter at the time of exam and therefore poorly evaluated by CT imaging. Further obscuration  by streak artifact from a hip prosthesis. Stomach/Bowel: Transesophageal tube in appropriate position terminating in the gastric body with side port distal to the GE junction as detailed above. Mild tenting of the anterior stomach is a typical finding for placement of a transesophageal tube. Small duodenal lipoma. Small distal duodenal diverticulum (3/69). No small bowel thickening or dilatation. Mild fecalization of the distal small bowel contents can reflect some slowed intestinal transit Appendix is not visualized. No focal inflammation the vicinity of the cecum to suggest an occult appendicitis. No colonic dilatation or wall thickening. Vascular/Lymphatic: Extensive atherosclerotic calcification throughout the abdominal aorta and branch vessels. Luminal evaluation precluded in the absence of contrast media. No aneurysm or ectasia. No periaortic stranding or hemorrhage. No suspicious or enlarged lymph nodes in the included lymphatic chains. Reproductive: In prostate traversed by the Foley catheter. Coarse eccentric calcification of the prostate. No concerning abnormalities of the prostate or seminal vesicles. Nonspecific penile calcifications are noted. Other: No abdominopelvic free air or fluid. No bowel containing hernia. Small fat containing umbilical hernia. No traumatic abdominal wall dehiscence. No body wall or retroperitoneal hematoma. Musculoskeletal: The osseous structures appear diffusely demineralized which may limit detection of small or nondisplaced fractures. No acute osseous abnormality or suspicious osseous lesion. Extensive streak artifact related to patient's right hip arthroplasty. Multilevel degenerative changes are present in the imaged portions of the spine. Additional degenerative changes in the hips and pelvis. No acute osseous abnormality of the included upper extremities. IMPRESSION: CT HEAD:  1. No acute intracranial abnormality. 2. Stable parenchymal volume loss and chronic microvascular ischemic changes. 3. Debris in the external auditory canals. CT CERVICAL SPINE: 1. No acute cervical spine fracture or traumatic listhesis. 2. Multilevel degenerative changes of the cervical spine, as described above. CT CHEST, ABDOMEN AND PELVIS 1. Unenhanced CT was performed per clinician order. Lack of IV contrast limits sensitivity and specificity, especially for evaluation of the vasculature and abdominal/pelvic solid viscera. 2. Posterior left third through sixth rib fractures. Posterior right rib 4 rib fractures. Can be seen in the setting of resuscitative efforts. No pneumothorax. 3. Heterogeneous largely dependent areas of mixed ground-glass and dense consolidation in both lungs with fluid-filled dependent airways as well as septal and fissural thickening, and small bilateral pleural effusions as well as features of volume loss. Such findings are worrisome for sequela of aspiration likely with superimposed pulmonary edema and volume loss. Mild contusive changes may also be present in the setting of resuscitation. 4. Endotracheal tube in the mid to lower trachea, 3 cm from the carina. 5. Transesophageal tube in appropriate position terminating in the gastric body with side port distal to the GE junction. 6. No other acute abdominopelvic abnormalities. 7. Nonobstructing right nephrolithiasis. 8. Hyperdense cystic structures arising from the lateral right interpolar kidney and upper pole left kidney., possible hyperdense exophytic cyst. Recommend outpatient renal ultrasound. 9. Aortic Atherosclerosis (ICD10-I70.0). Electronically Signed   By: Lovena Le M.D.   On: 11/30/2019 02:57   CT Head Wo Contrast  Result Date: 11/30/2019 CLINICAL DATA:  Found down with seizure-like activity, V-tach, seizure while in ED EXAM: CT HEAD WITHOUT CONTRAST CT CERVICAL SPINE WITHOUT CONTRAST CT CHEST, ABDOMEN AND PELVIS WITHOUT  CONTRAST TECHNIQUE: Contiguous axial images were obtained from the base of the skull through the vertex without intravenous contrast. Multidetector CT imaging of the cervical spine was performed without intravenous contrast. Multiplanar CT image reconstructions were also generated. Multidetector CT imaging of the chest, abdomen and pelvis was performed following the standard  protocol without IV contrast. COMPARISON:  CT head 09/16/2019, MR 06/05/2018, same day chest radiograph, CT abdomen pelvis 09/17/2006 FINDINGS: CT HEAD FINDINGS Brain: No evidence of acute infarction, hemorrhage, hydrocephalus, extra-axial collection or mass lesion/mass effect. Symmetric prominence of the ventricles, cisterns and sulci compatible with parenchymal volume loss. Patchy areas of white matter hypoattenuation are most compatible with chronic microvascular angiopathy. Vascular: Atherosclerotic calcification of the carotid siphons and intradural vertebral arteries. No hyperdense vessel. Skull: No acute scalp swelling or hematoma. No calvarial fracture or other acute or suspicious osseous lesion. Chronic left occipital scalp infiltration, likely scarring. Small right frontal scalp lipoma, stable. Sinuses/Orbits: Paranasal sinuses and mastoid air cells are predominantly clear. Pneumatization of the petrous apices. Orbital structures are unremarkable aside from prior lens extractions. Other: Extensive debris in the external auditory canals. Mild bilateral TMJ arthrosis. Largely edentulous with mild mandibular prognathism. CT CERVICAL FINDINGS Alignment: Stabilization collar is absent. Mild straightening of the upper cervical lordosis with reversal at the C4-5 levels. Mild retrolisthesis C4 on 5 and C5 on 6 is likely degenerative in nature. No evidence of traumatic listhesis. No abnormally widened, perched or jumped facets. Normal alignment of the craniocervical and atlantoaxial articulations accounting for leftward cranial rotation. Skull  base and vertebrae: No acute skull base fracture. No vertebral fracture or height loss. Arthrosis present at the atlantodental interval with some mild calcific pannus formation. Multilevel spondylitic changes are detailed below. Soft tissues and spinal canal: No pre or paravertebral fluid or swelling. No visible canal hematoma. Enthesopathic changes in the nuchal ligament. Disc levels: Multilevel intervertebral disc height loss with spondylitic endplate changes. Features maximal C4-5 and C5-6 with small posterior disc osteophyte complexes resulting in at most mild canal stenosis. Uncinate spurring facet hypertrophic changes are maximal at these levels as well with moderate to severe stenosis on the left at C5-6 and more moderate changes on the right at C4-5. Additional multilevel foraminal narrowing is mild. Other: Secretions noted in the posterior naso and oropharynx above the level of the inflated endotracheal balloon. Extensive cervical carotid atherosclerosis. No concerning thyroid nodules. CT CHEST FINDINGS Cardiovascular: Limited assessment of the vasculature in the absence of contrast media. Cardiac size within normal limits. Small pericardial effusion. Extensive three-vessel coronary artery disease. Extensive calcifications upon the aortic valve leaflets. Atherosclerotic plaque within the normal caliber aorta. Shared origin of brachiocephalic and left common carotid artery origins. Calcific plaque throughout the proximal great vessels. Central pulmonary arteries are borderline enlarged. Luminal evaluation precluded in the absence of contrast media. Mediastinum/Nodes: No mediastinal fluid or gas. Normal thyroid gland and thoracic inlet. No worrisome mediastinal or axillary adenopathy. Hilar nodal evaluation is limited in the absence of intravenous contrast media. Endotracheal tube terminates in the mid to lower trachea, 3 cm from the carina. Transesophageal tube tip terminates in the gastric body with side  port distal to the GE junction. Lungs/Pleura: Heterogeneous dependent areas of mixed ground-glass and dense consolidative opacities are present in both lungs with multiple fluid-filled dependent airways as well as interlobular septal thickening and fissural thickening and small bilateral pleural effusions with further atelectatic changes and volume loss as well. No pneumothorax. Musculoskeletal: Posterior left third through sixth left rib fractures. Posterior right rib 4 rib fractures. No other acute osseous injuries in the chest wall. Asymmetric sternoclavicular arthrosis, increased on the right. Degenerative changes in both shoulders. Multilevel degenerative changes present spine Multilevel flowing anterior osteophytosis, compatible with features of diffuse idiopathic skeletal hyperostosis (DISH). Mild body wall edema. CT ABDOMEN AND PELVIS FINDINGS  Hepatobiliary: No visible hepatic injury or concerning liver lesion on this unenhanced CT. No perihepatic hematoma. Smooth surface contour. Normal attenuation. Patient is post cholecystectomy. Slight prominence of the biliary tree likely related to post cholecystectomy reservoir effect without visible calcified intraductal gallstones. Pancreas: Moderate pancreatic atrophy and partial fatty replacement of the pancreas. No ductal dilatation, discernible lesions, or inflammation. Spleen: No direct splenic injury. No perisplenic hematoma. Normal splenic size. No concerning lesions. Few scattered vascular calcifications. Adrenals/Urinary Tract: Mild lobular thickening of the adrenal gland could reflect some senescent hyperplasia without concerning nodules. No evidence of adrenal hemorrhage. No direct renal injury or perinephric hematoma. Bilateral renal atrophy with mild symmetric perinephric stranding, possibly related to senescent change or diminished renal function. Few vascular calcifications are present in both kidneys. Collecting system calculus seen in the upper pole  right kidney. No obstructive urolithiasis is evident. Hyperdense 18 mm cystic structure arising from the posterior interpolar right kidney (3/72) additional subcentimeter hyperdense cystic structure arising from the upper pole left kidney (3/65). No other concerning renal lesions. Urinary bladder is largely decompressed by Foley catheter at the time of exam and therefore poorly evaluated by CT imaging. Further obscuration by streak artifact from a hip prosthesis. Stomach/Bowel: Transesophageal tube in appropriate position terminating in the gastric body with side port distal to the GE junction as detailed above. Mild tenting of the anterior stomach is a typical finding for placement of a transesophageal tube. Small duodenal lipoma. Small distal duodenal diverticulum (3/69). No small bowel thickening or dilatation. Mild fecalization of the distal small bowel contents can reflect some slowed intestinal transit Appendix is not visualized. No focal inflammation the vicinity of the cecum to suggest an occult appendicitis. No colonic dilatation or wall thickening. Vascular/Lymphatic: Extensive atherosclerotic calcification throughout the abdominal aorta and branch vessels. Luminal evaluation precluded in the absence of contrast media. No aneurysm or ectasia. No periaortic stranding or hemorrhage. No suspicious or enlarged lymph nodes in the included lymphatic chains. Reproductive: In prostate traversed by the Foley catheter. Coarse eccentric calcification of the prostate. No concerning abnormalities of the prostate or seminal vesicles. Nonspecific penile calcifications are noted. Other: No abdominopelvic free air or fluid. No bowel containing hernia. Small fat containing umbilical hernia. No traumatic abdominal wall dehiscence. No body wall or retroperitoneal hematoma. Musculoskeletal: The osseous structures appear diffusely demineralized which may limit detection of small or nondisplaced fractures. No acute osseous  abnormality or suspicious osseous lesion. Extensive streak artifact related to patient's right hip arthroplasty. Multilevel degenerative changes are present in the imaged portions of the spine. Additional degenerative changes in the hips and pelvis. No acute osseous abnormality of the included upper extremities. IMPRESSION: CT HEAD: 1. No acute intracranial abnormality. 2. Stable parenchymal volume loss and chronic microvascular ischemic changes. 3. Debris in the external auditory canals. CT CERVICAL SPINE: 1. No acute cervical spine fracture or traumatic listhesis. 2. Multilevel degenerative changes of the cervical spine, as described above. CT CHEST, ABDOMEN AND PELVIS 1. Unenhanced CT was performed per clinician order. Lack of IV contrast limits sensitivity and specificity, especially for evaluation of the vasculature and abdominal/pelvic solid viscera. 2. Posterior left third through sixth rib fractures. Posterior right rib 4 rib fractures. Can be seen in the setting of resuscitative efforts. No pneumothorax. 3. Heterogeneous largely dependent areas of mixed ground-glass and dense consolidation in both lungs with fluid-filled dependent airways as well as septal and fissural thickening, and small bilateral pleural effusions as well as features of volume loss. Such findings are  worrisome for sequela of aspiration likely with superimposed pulmonary edema and volume loss. Mild contusive changes may also be present in the setting of resuscitation. 4. Endotracheal tube in the mid to lower trachea, 3 cm from the carina. 5. Transesophageal tube in appropriate position terminating in the gastric body with side port distal to the GE junction. 6. No other acute abdominopelvic abnormalities. 7. Nonobstructing right nephrolithiasis. 8. Hyperdense cystic structures arising from the lateral right interpolar kidney and upper pole left kidney., possible hyperdense exophytic cyst. Recommend outpatient renal ultrasound. 9. Aortic  Atherosclerosis (ICD10-I70.0). Electronically Signed   By: Lovena Le M.D.   On: 11/30/2019 02:57   CT Chest Wo Contrast  Result Date: 11/30/2019 CLINICAL DATA:  Found down with seizure-like activity, V-tach, seizure while in ED EXAM: CT HEAD WITHOUT CONTRAST CT CERVICAL SPINE WITHOUT CONTRAST CT CHEST, ABDOMEN AND PELVIS WITHOUT CONTRAST TECHNIQUE: Contiguous axial images were obtained from the base of the skull through the vertex without intravenous contrast. Multidetector CT imaging of the cervical spine was performed without intravenous contrast. Multiplanar CT image reconstructions were also generated. Multidetector CT imaging of the chest, abdomen and pelvis was performed following the standard protocol without IV contrast. COMPARISON:  CT head 09/16/2019, MR 06/05/2018, same day chest radiograph, CT abdomen pelvis 09/17/2006 FINDINGS: CT HEAD FINDINGS Brain: No evidence of acute infarction, hemorrhage, hydrocephalus, extra-axial collection or mass lesion/mass effect. Symmetric prominence of the ventricles, cisterns and sulci compatible with parenchymal volume loss. Patchy areas of white matter hypoattenuation are most compatible with chronic microvascular angiopathy. Vascular: Atherosclerotic calcification of the carotid siphons and intradural vertebral arteries. No hyperdense vessel. Skull: No acute scalp swelling or hematoma. No calvarial fracture or other acute or suspicious osseous lesion. Chronic left occipital scalp infiltration, likely scarring. Small right frontal scalp lipoma, stable. Sinuses/Orbits: Paranasal sinuses and mastoid air cells are predominantly clear. Pneumatization of the petrous apices. Orbital structures are unremarkable aside from prior lens extractions. Other: Extensive debris in the external auditory canals. Mild bilateral TMJ arthrosis. Largely edentulous with mild mandibular prognathism. CT CERVICAL FINDINGS Alignment: Stabilization collar is absent. Mild straightening of  the upper cervical lordosis with reversal at the C4-5 levels. Mild retrolisthesis C4 on 5 and C5 on 6 is likely degenerative in nature. No evidence of traumatic listhesis. No abnormally widened, perched or jumped facets. Normal alignment of the craniocervical and atlantoaxial articulations accounting for leftward cranial rotation. Skull base and vertebrae: No acute skull base fracture. No vertebral fracture or height loss. Arthrosis present at the atlantodental interval with some mild calcific pannus formation. Multilevel spondylitic changes are detailed below. Soft tissues and spinal canal: No pre or paravertebral fluid or swelling. No visible canal hematoma. Enthesopathic changes in the nuchal ligament. Disc levels: Multilevel intervertebral disc height loss with spondylitic endplate changes. Features maximal C4-5 and C5-6 with small posterior disc osteophyte complexes resulting in at most mild canal stenosis. Uncinate spurring facet hypertrophic changes are maximal at these levels as well with moderate to severe stenosis on the left at C5-6 and more moderate changes on the right at C4-5. Additional multilevel foraminal narrowing is mild. Other: Secretions noted in the posterior naso and oropharynx above the level of the inflated endotracheal balloon. Extensive cervical carotid atherosclerosis. No concerning thyroid nodules. CT CHEST FINDINGS Cardiovascular: Limited assessment of the vasculature in the absence of contrast media. Cardiac size within normal limits. Small pericardial effusion. Extensive three-vessel coronary artery disease. Extensive calcifications upon the aortic valve leaflets. Atherosclerotic plaque within the normal caliber aorta.  Shared origin of brachiocephalic and left common carotid artery origins. Calcific plaque throughout the proximal great vessels. Central pulmonary arteries are borderline enlarged. Luminal evaluation precluded in the absence of contrast media. Mediastinum/Nodes: No  mediastinal fluid or gas. Normal thyroid gland and thoracic inlet. No worrisome mediastinal or axillary adenopathy. Hilar nodal evaluation is limited in the absence of intravenous contrast media. Endotracheal tube terminates in the mid to lower trachea, 3 cm from the carina. Transesophageal tube tip terminates in the gastric body with side port distal to the GE junction. Lungs/Pleura: Heterogeneous dependent areas of mixed ground-glass and dense consolidative opacities are present in both lungs with multiple fluid-filled dependent airways as well as interlobular septal thickening and fissural thickening and small bilateral pleural effusions with further atelectatic changes and volume loss as well. No pneumothorax. Musculoskeletal: Posterior left third through sixth left rib fractures. Posterior right rib 4 rib fractures. No other acute osseous injuries in the chest wall. Asymmetric sternoclavicular arthrosis, increased on the right. Degenerative changes in both shoulders. Multilevel degenerative changes present spine Multilevel flowing anterior osteophytosis, compatible with features of diffuse idiopathic skeletal hyperostosis (DISH). Mild body wall edema. CT ABDOMEN AND PELVIS FINDINGS Hepatobiliary: No visible hepatic injury or concerning liver lesion on this unenhanced CT. No perihepatic hematoma. Smooth surface contour. Normal attenuation. Patient is post cholecystectomy. Slight prominence of the biliary tree likely related to post cholecystectomy reservoir effect without visible calcified intraductal gallstones. Pancreas: Moderate pancreatic atrophy and partial fatty replacement of the pancreas. No ductal dilatation, discernible lesions, or inflammation. Spleen: No direct splenic injury. No perisplenic hematoma. Normal splenic size. No concerning lesions. Few scattered vascular calcifications. Adrenals/Urinary Tract: Mild lobular thickening of the adrenal gland could reflect some senescent hyperplasia without  concerning nodules. No evidence of adrenal hemorrhage. No direct renal injury or perinephric hematoma. Bilateral renal atrophy with mild symmetric perinephric stranding, possibly related to senescent change or diminished renal function. Few vascular calcifications are present in both kidneys. Collecting system calculus seen in the upper pole right kidney. No obstructive urolithiasis is evident. Hyperdense 18 mm cystic structure arising from the posterior interpolar right kidney (3/72) additional subcentimeter hyperdense cystic structure arising from the upper pole left kidney (3/65). No other concerning renal lesions. Urinary bladder is largely decompressed by Foley catheter at the time of exam and therefore poorly evaluated by CT imaging. Further obscuration by streak artifact from a hip prosthesis. Stomach/Bowel: Transesophageal tube in appropriate position terminating in the gastric body with side port distal to the GE junction as detailed above. Mild tenting of the anterior stomach is a typical finding for placement of a transesophageal tube. Small duodenal lipoma. Small distal duodenal diverticulum (3/69). No small bowel thickening or dilatation. Mild fecalization of the distal small bowel contents can reflect some slowed intestinal transit Appendix is not visualized. No focal inflammation the vicinity of the cecum to suggest an occult appendicitis. No colonic dilatation or wall thickening. Vascular/Lymphatic: Extensive atherosclerotic calcification throughout the abdominal aorta and branch vessels. Luminal evaluation precluded in the absence of contrast media. No aneurysm or ectasia. No periaortic stranding or hemorrhage. No suspicious or enlarged lymph nodes in the included lymphatic chains. Reproductive: In prostate traversed by the Foley catheter. Coarse eccentric calcification of the prostate. No concerning abnormalities of the prostate or seminal vesicles. Nonspecific penile calcifications are noted.  Other: No abdominopelvic free air or fluid. No bowel containing hernia. Small fat containing umbilical hernia. No traumatic abdominal wall dehiscence. No body wall or retroperitoneal hematoma. Musculoskeletal: The osseous structures  appear diffusely demineralized which may limit detection of small or nondisplaced fractures. No acute osseous abnormality or suspicious osseous lesion. Extensive streak artifact related to patient's right hip arthroplasty. Multilevel degenerative changes are present in the imaged portions of the spine. Additional degenerative changes in the hips and pelvis. No acute osseous abnormality of the included upper extremities. IMPRESSION: CT HEAD: 1. No acute intracranial abnormality. 2. Stable parenchymal volume loss and chronic microvascular ischemic changes. 3. Debris in the external auditory canals. CT CERVICAL SPINE: 1. No acute cervical spine fracture or traumatic listhesis. 2. Multilevel degenerative changes of the cervical spine, as described above. CT CHEST, ABDOMEN AND PELVIS 1. Unenhanced CT was performed per clinician order. Lack of IV contrast limits sensitivity and specificity, especially for evaluation of the vasculature and abdominal/pelvic solid viscera. 2. Posterior left third through sixth rib fractures. Posterior right rib 4 rib fractures. Can be seen in the setting of resuscitative efforts. No pneumothorax. 3. Heterogeneous largely dependent areas of mixed ground-glass and dense consolidation in both lungs with fluid-filled dependent airways as well as septal and fissural thickening, and small bilateral pleural effusions as well as features of volume loss. Such findings are worrisome for sequela of aspiration likely with superimposed pulmonary edema and volume loss. Mild contusive changes may also be present in the setting of resuscitation. 4. Endotracheal tube in the mid to lower trachea, 3 cm from the carina. 5. Transesophageal tube in appropriate position terminating in  the gastric body with side port distal to the GE junction. 6. No other acute abdominopelvic abnormalities. 7. Nonobstructing right nephrolithiasis. 8. Hyperdense cystic structures arising from the lateral right interpolar kidney and upper pole left kidney., possible hyperdense exophytic cyst. Recommend outpatient renal ultrasound. 9. Aortic Atherosclerosis (ICD10-I70.0). Electronically Signed   By: Lovena Le M.D.   On: 11/30/2019 02:57   CT Cervical Spine Wo Contrast  Result Date: 11/30/2019 CLINICAL DATA:  Found down with seizure-like activity, V-tach, seizure while in ED EXAM: CT HEAD WITHOUT CONTRAST CT CERVICAL SPINE WITHOUT CONTRAST CT CHEST, ABDOMEN AND PELVIS WITHOUT CONTRAST TECHNIQUE: Contiguous axial images were obtained from the base of the skull through the vertex without intravenous contrast. Multidetector CT imaging of the cervical spine was performed without intravenous contrast. Multiplanar CT image reconstructions were also generated. Multidetector CT imaging of the chest, abdomen and pelvis was performed following the standard protocol without IV contrast. COMPARISON:  CT head 09/16/2019, MR 06/05/2018, same day chest radiograph, CT abdomen pelvis 09/17/2006 FINDINGS: CT HEAD FINDINGS Brain: No evidence of acute infarction, hemorrhage, hydrocephalus, extra-axial collection or mass lesion/mass effect. Symmetric prominence of the ventricles, cisterns and sulci compatible with parenchymal volume loss. Patchy areas of white matter hypoattenuation are most compatible with chronic microvascular angiopathy. Vascular: Atherosclerotic calcification of the carotid siphons and intradural vertebral arteries. No hyperdense vessel. Skull: No acute scalp swelling or hematoma. No calvarial fracture or other acute or suspicious osseous lesion. Chronic left occipital scalp infiltration, likely scarring. Small right frontal scalp lipoma, stable. Sinuses/Orbits: Paranasal sinuses and mastoid air cells are  predominantly clear. Pneumatization of the petrous apices. Orbital structures are unremarkable aside from prior lens extractions. Other: Extensive debris in the external auditory canals. Mild bilateral TMJ arthrosis. Largely edentulous with mild mandibular prognathism. CT CERVICAL FINDINGS Alignment: Stabilization collar is absent. Mild straightening of the upper cervical lordosis with reversal at the C4-5 levels. Mild retrolisthesis C4 on 5 and C5 on 6 is likely degenerative in nature. No evidence of traumatic listhesis. No abnormally widened, perched or  jumped facets. Normal alignment of the craniocervical and atlantoaxial articulations accounting for leftward cranial rotation. Skull base and vertebrae: No acute skull base fracture. No vertebral fracture or height loss. Arthrosis present at the atlantodental interval with some mild calcific pannus formation. Multilevel spondylitic changes are detailed below. Soft tissues and spinal canal: No pre or paravertebral fluid or swelling. No visible canal hematoma. Enthesopathic changes in the nuchal ligament. Disc levels: Multilevel intervertebral disc height loss with spondylitic endplate changes. Features maximal C4-5 and C5-6 with small posterior disc osteophyte complexes resulting in at most mild canal stenosis. Uncinate spurring facet hypertrophic changes are maximal at these levels as well with moderate to severe stenosis on the left at C5-6 and more moderate changes on the right at C4-5. Additional multilevel foraminal narrowing is mild. Other: Secretions noted in the posterior naso and oropharynx above the level of the inflated endotracheal balloon. Extensive cervical carotid atherosclerosis. No concerning thyroid nodules. CT CHEST FINDINGS Cardiovascular: Limited assessment of the vasculature in the absence of contrast media. Cardiac size within normal limits. Small pericardial effusion. Extensive three-vessel coronary artery disease. Extensive calcifications  upon the aortic valve leaflets. Atherosclerotic plaque within the normal caliber aorta. Shared origin of brachiocephalic and left common carotid artery origins. Calcific plaque throughout the proximal great vessels. Central pulmonary arteries are borderline enlarged. Luminal evaluation precluded in the absence of contrast media. Mediastinum/Nodes: No mediastinal fluid or gas. Normal thyroid gland and thoracic inlet. No worrisome mediastinal or axillary adenopathy. Hilar nodal evaluation is limited in the absence of intravenous contrast media. Endotracheal tube terminates in the mid to lower trachea, 3 cm from the carina. Transesophageal tube tip terminates in the gastric body with side port distal to the GE junction. Lungs/Pleura: Heterogeneous dependent areas of mixed ground-glass and dense consolidative opacities are present in both lungs with multiple fluid-filled dependent airways as well as interlobular septal thickening and fissural thickening and small bilateral pleural effusions with further atelectatic changes and volume loss as well. No pneumothorax. Musculoskeletal: Posterior left third through sixth left rib fractures. Posterior right rib 4 rib fractures. No other acute osseous injuries in the chest wall. Asymmetric sternoclavicular arthrosis, increased on the right. Degenerative changes in both shoulders. Multilevel degenerative changes present spine Multilevel flowing anterior osteophytosis, compatible with features of diffuse idiopathic skeletal hyperostosis (DISH). Mild body wall edema. CT ABDOMEN AND PELVIS FINDINGS Hepatobiliary: No visible hepatic injury or concerning liver lesion on this unenhanced CT. No perihepatic hematoma. Smooth surface contour. Normal attenuation. Patient is post cholecystectomy. Slight prominence of the biliary tree likely related to post cholecystectomy reservoir effect without visible calcified intraductal gallstones. Pancreas: Moderate pancreatic atrophy and partial  fatty replacement of the pancreas. No ductal dilatation, discernible lesions, or inflammation. Spleen: No direct splenic injury. No perisplenic hematoma. Normal splenic size. No concerning lesions. Few scattered vascular calcifications. Adrenals/Urinary Tract: Mild lobular thickening of the adrenal gland could reflect some senescent hyperplasia without concerning nodules. No evidence of adrenal hemorrhage. No direct renal injury or perinephric hematoma. Bilateral renal atrophy with mild symmetric perinephric stranding, possibly related to senescent change or diminished renal function. Few vascular calcifications are present in both kidneys. Collecting system calculus seen in the upper pole right kidney. No obstructive urolithiasis is evident. Hyperdense 18 mm cystic structure arising from the posterior interpolar right kidney (3/72) additional subcentimeter hyperdense cystic structure arising from the upper pole left kidney (3/65). No other concerning renal lesions. Urinary bladder is largely decompressed by Foley catheter at the time of exam and therefore  poorly evaluated by CT imaging. Further obscuration by streak artifact from a hip prosthesis. Stomach/Bowel: Transesophageal tube in appropriate position terminating in the gastric body with side port distal to the GE junction as detailed above. Mild tenting of the anterior stomach is a typical finding for placement of a transesophageal tube. Small duodenal lipoma. Small distal duodenal diverticulum (3/69). No small bowel thickening or dilatation. Mild fecalization of the distal small bowel contents can reflect some slowed intestinal transit Appendix is not visualized. No focal inflammation the vicinity of the cecum to suggest an occult appendicitis. No colonic dilatation or wall thickening. Vascular/Lymphatic: Extensive atherosclerotic calcification throughout the abdominal aorta and branch vessels. Luminal evaluation precluded in the absence of contrast media.  No aneurysm or ectasia. No periaortic stranding or hemorrhage. No suspicious or enlarged lymph nodes in the included lymphatic chains. Reproductive: In prostate traversed by the Foley catheter. Coarse eccentric calcification of the prostate. No concerning abnormalities of the prostate or seminal vesicles. Nonspecific penile calcifications are noted. Other: No abdominopelvic free air or fluid. No bowel containing hernia. Small fat containing umbilical hernia. No traumatic abdominal wall dehiscence. No body wall or retroperitoneal hematoma. Musculoskeletal: The osseous structures appear diffusely demineralized which may limit detection of small or nondisplaced fractures. No acute osseous abnormality or suspicious osseous lesion. Extensive streak artifact related to patient's right hip arthroplasty. Multilevel degenerative changes are present in the imaged portions of the spine. Additional degenerative changes in the hips and pelvis. No acute osseous abnormality of the included upper extremities. IMPRESSION: CT HEAD: 1. No acute intracranial abnormality. 2. Stable parenchymal volume loss and chronic microvascular ischemic changes. 3. Debris in the external auditory canals. CT CERVICAL SPINE: 1. No acute cervical spine fracture or traumatic listhesis. 2. Multilevel degenerative changes of the cervical spine, as described above. CT CHEST, ABDOMEN AND PELVIS 1. Unenhanced CT was performed per clinician order. Lack of IV contrast limits sensitivity and specificity, especially for evaluation of the vasculature and abdominal/pelvic solid viscera. 2. Posterior left third through sixth rib fractures. Posterior right rib 4 rib fractures. Can be seen in the setting of resuscitative efforts. No pneumothorax. 3. Heterogeneous largely dependent areas of mixed ground-glass and dense consolidation in both lungs with fluid-filled dependent airways as well as septal and fissural thickening, and small bilateral pleural effusions as well  as features of volume loss. Such findings are worrisome for sequela of aspiration likely with superimposed pulmonary edema and volume loss. Mild contusive changes may also be present in the setting of resuscitation. 4. Endotracheal tube in the mid to lower trachea, 3 cm from the carina. 5. Transesophageal tube in appropriate position terminating in the gastric body with side port distal to the GE junction. 6. No other acute abdominopelvic abnormalities. 7. Nonobstructing right nephrolithiasis. 8. Hyperdense cystic structures arising from the lateral right interpolar kidney and upper pole left kidney., possible hyperdense exophytic cyst. Recommend outpatient renal ultrasound. 9. Aortic Atherosclerosis (ICD10-I70.0). Electronically Signed   By: Lovena Le M.D.   On: 11/30/2019 02:57   Portable Chest xray  Result Date: 12/01/2019 CLINICAL DATA:  Acute respiratory failure EXAM: PORTABLE CHEST 1 VIEW COMPARISON:  Chest x-rays dated 11/30/2019 and 09/16/2019. FINDINGS: Heart size and mediastinal contours are stable. Endotracheal tube has been placed with tip adequately positioned approximately 4 cm above the carina. Enteric tube in place. Pacer pad overlies the LEFT heart. Central pulmonary vascular congestion and probable interstitial edema. No evidence of pneumonia. No pleural effusion or pneumothorax is seen. IMPRESSION: 1. Endotracheal tube  adequately positioned with tip approximately 4 cm above the carina. 2. Probable mild interstitial edema. Electronically Signed   By: Franki Cabot M.D.   On: 12/01/2019 06:05   DG Chest Portable 1 View  Result Date: 11/30/2019 CLINICAL DATA:  Shortness of breath EXAM: PORTABLE CHEST 1 VIEW COMPARISON:  09/16/2019 FINDINGS: Mild cardiomegaly. Diffuse interstitial opacity. No pleural effusion or pneumothorax. No focal airspace consolidation. IMPRESSION: Cardiomegaly and mild pulmonary edema. Electronically Signed   By: Ulyses Jarred M.D.   On: 11/30/2019 01:55    ECHOCARDIOGRAM COMPLETE  Result Date: 11/30/2019    ECHOCARDIOGRAM REPORT   Patient Name:   RONON FERGER Date of Exam: 11/30/2019 Medical Rec #:  [id number]             Height:       67.0 in Accession #:    [id number]            Weight:       179.5 lb Date of Birth:  03-Feb-1935            BSA:          1.931 m Patient Age:    55 years              BP:           120/64 mmHg Patient Gender: M                     HR:           63 bpm. Exam Location:  [facility] Procedure: 2D Echo, Cardiac Doppler and Color Doppler Indications:     Abnormal ECG 794.31  History:         Patient has prior history of Echocardiogram examinations, most                  recent 06/25/2019. CHF, Arrythmias:Atrial Fibrillation; Risk                  Factors:Hypertension and Diabetes. PVD.  Sonographer:     Sherrie Sport RDCS (AE) Referring Phys:  [id number] Bradly Bienenstock Diagnosing Phys: Ida Rogue MD  Sonographer Comments: Technically difficult study due to poor echo windows, no apical window, no parasternal window and echo performed with patient supine and on artificial respirator. IMPRESSIONS  1. Left ventricular ejection fraction, by estimation, is 50 to 55%. The left ventricle has low normal function. Unable to exclude regional wall motion abnormality.There is mild left ventricular hypertrophy. Left ventricular diastolic parameters are indeterminate.  2. Right ventricular systolic function was not well visualized. The right ventricular size is normal.  3. Challenging image quality FINDINGS  Left Ventricle: Left ventricular ejection fraction, by estimation, is 50 to 55%. The left ventricle has low normal function. The left ventricle has no regional wall motion abnormalities. The left ventricular internal cavity size was normal in size. There is mild left ventricular hypertrophy. Left ventricular diastolic parameters are indeterminate. Right Ventricle: The right ventricular size is normal. No increase in right ventricular wall thickness.  Right ventricular systolic function was not well visualized. Left Atrium: Left atrial size was not well visualized. Right Atrium: Right atrial size was not well visualized. Pericardium: There is no evidence of pericardial effusion. Mitral Valve: The mitral valve was not well visualized. Normal mobility of the mitral valve leaflets. No evidence of mitral valve regurgitation. No evidence of mitral valve stenosis. Tricuspid Valve: The tricuspid valve is not well visualized. Tricuspid valve regurgitation is not demonstrated.  No evidence of tricuspid stenosis. Aortic Valve: The aortic valve was not well visualized. Aortic valve regurgitation is not visualized. No aortic stenosis is present. Pulmonic Valve: The pulmonic valve was not well visualized. Pulmonic valve regurgitation is not visualized. No evidence of pulmonic stenosis. Aorta: The aortic root was not well visualized and the ascending aorta was not well visualized. Venous: The pulmonary veins were not well visualized. The inferior vena cava is normal in size with greater than 50% respiratory variability, suggesting right atrial pressure of 3 mmHg. IAS/Shunts: No atrial level shunt detected by color flow Doppler.  LEFT VENTRICLE PLAX 2D LVIDd:         4.95 cm LVIDs:         3.32 cm LV PW:         1.61 cm LV IVS:        1.49 cm  LEFT ATRIUM         Index LA diam:    4.80 cm 2.49 cm/m  PULMONIC VALVE PV Vmax:        0.86 m/s PV Peak grad:   3.0 mmHg RVOT Peak grad: 4 mmHg  Ida Rogue MD Electronically signed by Ida Rogue MD Signature Date/Time: 11/30/2019/6:57:51 PM    Final      Assessment and Recommendation  85 y.o. male with known severe peripheral vascular disease diabetes hypertension hyperlipidemia sleep apnea with apparent hypoxia and acute on chronic diastolic dysfunction congestive heart failure causing seizure with atrial fibrillation with rapid ventricular rate now spontaneously converted to normal sinus rhythm with amiodarone drip and stable  from the cardiovascular standpoint without evidence of myocardial infarction 1. Continue supportive care for respiratory failure seizure disorder and/or hypoxia without restriction 2.  Would consider reinstatement of amiodarone orally at 200 mg twice per day for maintenance of normal sinus rhythm unless contraindicated from other current condition 3. Anticoagulation with Xarelto for further risk reduction in stroke with atrial fibrillation without change today 4.  Continue low-dose beta-blocker for continued treatment of diastolic dysfunction heart failure and atrial fibrillation watching closely for bradycardia 5. No further cardiac diagnostics necessary at this time 6.  Okay for any diagnostic procedures necessary for neurologic assessment  Signed, Serafina Royals M.D. FACC

## 2019-12-03 NOTE — Plan of Care (Signed)
Pt continued on full support, VSS, pupils are equal and reactive to light, adequate urine out put. Rested well all night.

## 2019-12-04 DIAGNOSIS — Z515 Encounter for palliative care: Secondary | ICD-10-CM | POA: Diagnosis not present

## 2019-12-04 DIAGNOSIS — R569 Unspecified convulsions: Secondary | ICD-10-CM | POA: Diagnosis not present

## 2019-12-04 DIAGNOSIS — J690 Pneumonitis due to inhalation of food and vomit: Secondary | ICD-10-CM | POA: Diagnosis not present

## 2019-12-04 DIAGNOSIS — Z7189 Other specified counseling: Secondary | ICD-10-CM | POA: Diagnosis not present

## 2019-12-04 LAB — BASIC METABOLIC PANEL
Anion gap: 8 (ref 5–15)
BUN: 29 mg/dL — ABNORMAL HIGH (ref 8–23)
CO2: 24 mmol/L (ref 22–32)
Calcium: 8.3 mg/dL — ABNORMAL LOW (ref 8.9–10.3)
Chloride: 108 mmol/L (ref 98–111)
Creatinine, Ser: 1.49 mg/dL — ABNORMAL HIGH (ref 0.61–1.24)
GFR calc Af Amer: 50 mL/min — ABNORMAL LOW (ref >60)
GFR calc non Af Amer: 43 mL/min — ABNORMAL LOW (ref >60)
Glucose, Bld: 175 mg/dL — ABNORMAL HIGH (ref 70–99)
Potassium: 3.8 mmol/L (ref 3.5–5.1)
Sodium: 140 mmol/L (ref 135–145)

## 2019-12-04 LAB — GLUCOSE, CAPILLARY
Glucose-Capillary: 129 mg/dL — ABNORMAL HIGH (ref 70–99)
Glucose-Capillary: 140 mg/dL — ABNORMAL HIGH (ref 70–99)
Glucose-Capillary: 160 mg/dL — ABNORMAL HIGH (ref 70–99)
Glucose-Capillary: 164 mg/dL — ABNORMAL HIGH (ref 70–99)
Glucose-Capillary: 176 mg/dL — ABNORMAL HIGH (ref 70–99)
Glucose-Capillary: 245 mg/dL — ABNORMAL HIGH (ref 70–99)

## 2019-12-04 LAB — MAGNESIUM: Magnesium: 2.1 mg/dL (ref 1.7–2.4)

## 2019-12-04 LAB — PHOSPHORUS: Phosphorus: 3.4 mg/dL (ref 2.5–4.6)

## 2019-12-04 LAB — PROCALCITONIN: Procalcitonin: 1.18 ng/mL

## 2019-12-04 MED ORDER — POTASSIUM CHLORIDE 20 MEQ PO PACK
20.0000 meq | PACK | Freq: Once | ORAL | Status: AC
  Administered 2019-12-04: 20 meq
  Filled 2019-12-04: qty 1

## 2019-12-04 NOTE — Progress Notes (Signed)
The Clinical status was relayed to family in detail. Wife, son and daughter  Updated and notified of patients medical condition.  Patient remains unresponsive and will not open eyes to command.    patient with increased WOB and using accessory muscles to breathe Explained to family course of therapy and the modalities     Patient with Progressive multiorgan failure with very low chance of meaningful recovery despite all aggressive and optimal medical therapy.  Patient is in the dying  Process associated with suffering.  Family understands the situation.  They have consented and agreed to DNR/DNI plan for one way extubation  Family are satisfied with Plan of action and management. All questions answered  Additional CC time 32 mins   Jahmeer Porche Patricia Pesa, M.D.  [facility]  Medical Director [place] Director [facility] Cardio-Pulmonary Department

## 2019-12-04 NOTE — Progress Notes (Signed)
CRITICAL CARE NOTE  85 year old male with a past medical history significant for atrial fibrillation on Xarelto, HFpEF, CKD, diabetes mellitus, bilateral BKA's, PVD, sleep apnea, and COVID-19 infection in September 2020  PCCM is asked to admit the pt to ICU for further workup and treatment of Tonic-Clonic seizure, Acute Hypoxic Hypercapnic Respiratory Failure secondary to Aspiration Pneumonia &Pulmonary Edema, A-fib w/ RVR, intermittent nonsustained V-tach, and questionable NSTEMI. Cardiology and Neurology are consulted.   11/30/19- Patient remains on MV weaning FiO2 to 80%, s/p TTE this AM, plan for EEG tonight. Discussed short term care plan with neurology Dr Doy Mince. Tracheal aspirate with thick solid chunks of discolored material concerning for aspiration. INTUBATED/SEDATED  12/01/19- Patient improved , FiO2 50% 12/02/19 - patient is further weaned on FIO2 , he did not tolerate SBT, had sustained VT today with pulse responsive to beta blockade. Will re-attempt SBT tommorow  7/11 failed weaning trials ?family 7/12 remains on vent 7/13 failed weaning trial, multiorgan failure      CC  follow up respiratory failure  SUBJECTIVE Patient remains critically ill Prognosis is guarded   BP (!) 141/57   Pulse (!) 57   Temp 99.3 F (37.4 C)   Resp (!) 24   Ht _0  (1.702 m)   Wt 83.4 kg   SpO2 98%   BMI 28.80 kg/m    I/O last 3 completed shifts: In: 2340.9 [I.V.:357.4; NG/GT:550; IV Piggyback:1433.5] Out: 2951 [Urine:1825] No intake/output data recorded.  SpO2: 98 % FiO2 (%): 40 %  Estimated body mass index is 28.8 kg/m as calculated from the following:   Height as of this encounter: _1  (1.702 m).   Weight as of this encounter: 83.4 kg.  SIGNIFICANT EVENTS   REVIEW OF SYSTEMS  PATIENT IS UNABLE TO PROVIDE COMPLETE REVIEW OF SYSTEMS DUE TO SEVERE CRITICAL ILLNESS   Pressure Injury 02/08/19 Buttocks Right;Lower Stage II -  Partial thickness loss of dermis  presenting as a shallow open ulcer with a red, pink wound bed without slough. (Active)  02/08/19 1610  Location: Buttocks  Location Orientation: Right;Lower  Staging: Stage II -  Partial thickness loss of dermis presenting as a shallow open ulcer with a red, pink wound bed without slough.  Wound Description (Comments):   Present on Admission: Yes     Pressure Injury 02/08/19 Scrotum Posterior;Medial Stage II -  Partial thickness loss of dermis presenting as a shallow open ulcer with a red, pink wound bed without slough. (Active)  02/08/19 1611  Location: Scrotum  Location Orientation: Posterior;Medial  Staging: Stage II -  Partial thickness loss of dermis presenting as a shallow open ulcer with a red, pink wound bed without slough.  Wound Description (Comments):   Present on Admission: Yes      PHYSICAL EXAMINATION:  GENERAL:critically ill appearing, +resp distress HEAD: Normocephalic, atraumatic.  EYES: Pupils equal, round, reactive to light.  No scleral icterus.  MOUTH: Moist mucosal membrane. NECK: Supple.  PULMONARY: +rhonchi, +wheezing CARDIOVASCULAR: S1 and S2. Regular rate and rhythm. No murmurs, rubs, or gallops.  GASTROINTESTINAL: Soft, nontender, -distended.  Positive bowel sounds.   MUSCULOSKELETAL: b/l BKA NEUROLOGIC: obtunded, GCS<8 SKIN:intact,warm,dry  MEDICATIONS: I have reviewed all medications and confirmed regimen as documented   CULTURE RESULTS   Recent Results (from the past 240 hour(s))  SARS Coronavirus 2 by RT PCR (hospital order, performed in [facility] hospital lab) Nasopharyngeal Nasopharyngeal Swab     Status: None   Collection Time: 11/30/19  2:54 AM   Specimen:  Nasopharyngeal Swab  Result Value Ref Range Status   SARS Coronavirus 2 NEGATIVE NEGATIVE Final    Comment: (NOTE) SARS-CoV-2 target nucleic acids are NOT DETECTED.  The SARS-CoV-2 RNA is generally detectable in upper and lower respiratory specimens during the acute phase of  infection. The lowest concentration of SARS-CoV-2 viral copies this assay can detect is 250 copies / mL. A negative result does not preclude SARS-CoV-2 infection and should not be used as the sole basis for treatment or other patient management decisions.  A negative result may occur with improper specimen collection / handling, submission of specimen other than nasopharyngeal swab, presence of viral mutation(s) within the areas targeted by this assay, and inadequate number of viral copies (<250 copies / mL). A negative result must be combined with clinical observations, patient history, and epidemiological information.  Fact Sheet for Patients:   StrictlyIdeas.no  Fact Sheet for Healthcare Providers: BankingDealers.co.za  This test is not yet approved or  cleared by the [place] FDA and has been authorized for detection and/or diagnosis of SARS-CoV-2 by FDA under an Emergency Use Authorization (EUA).  This EUA will remain in effect (meaning this test can be used) for the duration of the COVID-19 declaration under Section 564(b)(1) of the Act, 21 U.S.C. section 360bbb-3(b)(1), unless the authorization is terminated or revoked sooner.  Performed at [facility], [address] [place], [place] [address]   MRSA PCR Screening     Status: None   Collection Time: 11/30/19  4:44 AM   Specimen: Nasopharyngeal  Result Value Ref Range Status   MRSA by PCR NEGATIVE NEGATIVE Final    Comment:        The GeneXpert MRSA Assay (FDA approved for NASAL specimens only), is one component of a comprehensive MRSA colonization surveillance program. It is not intended to diagnose MRSA infection nor to guide or monitor treatment for MRSA infections. Performed at [facility], [address] [place], [place] [address]   CULTURE, BLOOD (ROUTINE X 2) w Reflex to ID Panel     Status: None (Preliminary result)   Collection  Time: 11/30/19  5:33 AM   Specimen: Left Antecubital; Blood  Result Value Ref Range Status   Specimen Description LEFT ANTECUBITAL  Final   Special Requests   Final    BOTTLES DRAWN AEROBIC AND ANAEROBIC Blood Culture adequate volume   Culture   Final    NO GROWTH 4 DAYS Performed at [facility], [address] [place], [place] [address]    Report Status PENDING  Incomplete  CULTURE, BLOOD (ROUTINE X 2) w Reflex to ID Panel     Status: None (Preliminary result)   Collection Time: 11/30/19  5:42 AM   Specimen: BLOOD LEFT FOREARM  Result Value Ref Range Status   Specimen Description BLOOD LEFT FOREARM  Final   Special Requests   Final    BOTTLES DRAWN AEROBIC AND ANAEROBIC Blood Culture adequate volume   Culture   Final    NO GROWTH 4 DAYS Performed at [facility], [address] [place], [place] [address]    Report Status PENDING  Incomplete  Culture, respiratory     Status: None   Collection Time: 11/30/19 11:18 AM   Specimen: Tracheal Aspirate; Respiratory  Result Value Ref Range Status   Specimen Description   Final    TRACHEAL ASPIRATE Performed at [facility], [address], [place], [place] [address]    Special Requests   Final  NONE Performed at [facility], [address] [place], [place] [address]    Gram Stain   Final    FEW WBC PRESENT, PREDOMINANTLY PMN FEW GRAM POSITIVE COCCI RARE YEAST    Culture   Final    FEW Consistent with normal respiratory flora. Performed at [facility], [place] [address], [place], [place] [address]    Report Status 12/02/2019 FINAL  Final        BMP Latest Ref Rng & Units 12/04/2019 12/03/2019 12/02/2019  Glucose 70 - 99 mg/dL 175(H) 140(H) 125(H)  BUN 8 - 23 mg/dL 29(H) 30(H) 28(H)  Creatinine 0.61 - 1.24 mg/dL 1.49(H) 1.62(H) 1.56(H)  Sodium 135 - 145 mmol/L 140 141 139  Potassium 3.5 - 5.1 mmol/L 3.8 4.0 3.4(L)  Chloride 98 - 111 mmol/L 108 108 105  CO2 22 - 32 mmol/L _0 Calcium 8.9 - 10.3 mg/dL 8.3(L) 8.4(L) 8.6(L)       Indwelling Urinary Catheter continued, requirement due to   Reason to continue Indwelling Urinary Catheter strict Intake/Output monitoring for hemodynamic instability   Central Line/ continued, requirement due to  Reason to continue [place] of central venous pressure or other hemodynamic parameters and poor IV access   Ventilator continued, requirement due to severe respiratory failure   Ventilator Sedation RASS 0 to -2      ASSESSMENT AND PLAN SYNOPSIS  85 yo WM admitted for severe resp failure and acute encephalopathy and aspiration pneumonia and sepsis with seizures    Severe ACUTE Hypoxic and Hypercapnic Respiratory Failure -continue Full MV support -continue Bronchodilator Therapy -Wean Fio2 and PEEP as tolerated -will perform SAT/SBT when respiratory parameters are met -VAP/VENT bundle implementation  ACUTE SYSTOLIC CARDIAC FAILURE- HFpEF -oxygen as needed   ACUTE KIDNEY INJURY/Renal Failure -continue Foley Catheter-assess need -Avoid nephrotoxic agents -Follow urine output, BMP -Ensure adequate renal perfusion, optimize oxygenation -Renal dose medications     NEUROLOGY Acute toxic metabolic encephalopathy, need for sedation Goal RASS -2 to -3 Follow up [place] ICU monitoring  ID Dx of aspiration pneumonia -continue IV abx as prescibed -follow up cultures  GI GI PROPHYLAXIS as indicated  DIET-->TF's as tolerated Constipation protocol as indicated  ENDO - will use ICU hypoglycemic\Hyperglycemia protocol if indicated     ELECTROLYTES -follow labs as needed -replace as needed -pharmacy consultation and following   DVT/GI PRX ordered and assessed TRANSFUSIONS AS NEEDED MONITOR FSBS I Assessed the need for Labs I Assessed the need for Foley I Assessed the need for Central Venous Line Family Discussion when available I Assessed the need for  Mobilization I made an Assessment of medications to be adjusted accordingly Safety Risk assessment completed   CASE DISCUSSED IN MULTIDISCIPLINARY ROUNDS WITH ICU TEAM  Critical Care Time devoted to patient care services described in this note is 31 minutes.   Overall, patient is critically ill, prognosis is guarded.  Patient with Multiorgan failure and at high risk for cardiac arrest and death.   Recommend DNR status Palliative care consulted  Corrin Parker, M.D.  [facility]  Medical Director [place] Director [facility] Cardio-Pulmonary Department

## 2019-12-04 NOTE — Progress Notes (Signed)
Palliative:  Mr. Hosey, [place], is lying quietly in bed.  He is intubated/ventilated, and on SBT.  His wife of 39 years is at bedside.  We talked about Lou's acute and chronic health concerns.  We talked about multiple body systems that are failing.  Unfortunately, Mrs. Chalfin cannot tell me if her son [name] has legal guardianship, and she seems to not understand the severity of her husband's illness.  She tells me that [name] will be here 7 or 8:00 tonight after he gets off of work.  We talked briefly about CODE STATUS, CPR.  Mrs. Feggins tells me that she and her husband have never discussed this.  Palliative team in ICU for another family meeting and see Lou's family talking with CCM attending and bedside nursing staff. They are discussing comfort measures, what this would look like and feel like.  They are discussing family visitation.  Conference with attending, bedside nursing staff, transition of care team related to patient condition, needs, goals of care.  This palliative for provider will follow up on Thursday.  Plan: CCM attending is having a family meeting with Lou's wife and son.   Anticipate transition to full comfort care, compassionate extubation.      25 minutes  Quinn Axe, NP Palliative medicine team Team phone [phone] Greater than 50% of this time was spent counseling and coordinating care related to the above assessment and plan.

## 2019-12-04 NOTE — Consult Note (Signed)
[place] for Electrolyte Monitoring and Replacement   Recent Labs: Potassium (mmol/L)  Date Value  12/04/2019 3.8  01/20/2013 3.5   Magnesium (mg/dL)  Date Value  12/04/2019 2.1   Calcium (mg/dL)  Date Value  12/04/2019 8.3 (L)   Calcium, Total (mg/dL)  Date Value  01/20/2013 9.4   Albumin (g/dL)  Date Value  12/03/2019 2.5 (L)  01/19/2013 3.7   Phosphorus (mg/dL)  Date Value  12/04/2019 3.4   Sodium (mmol/L)  Date Value  12/04/2019 140  01/20/2013 140   Corrected Ca: 9.6 mg/dL  Assessment:85 year old male with a past medical history significant for atrial fibrillation on Xarelto, HFpEF, CKD, diabetes mellitus, bilateral BKA's, PVD, sleep apnea, and COVID-19 infection in September 2020 who presents to [facility] ED on 11/30/2019 due to a seizure.  Patient is receiving nutrition via continuous tube feeds + free water flushes 200 mL q4h. Good UOP, bowel movement yesterday.  Goal of Therapy:  Potassium 4.0 - 5.1 mmol/L Magnesium 2.0 - 2.4 mg/dL All Other Electrolytes WNL  Plan:   K 3.8, will give KCl 20 mEq via tube x1  Re-check electrolytes in AM 7/14  Benita Gutter 12/04/2019 7:57 AM

## 2019-12-04 NOTE — Plan of Care (Addendum)
Pt continued on full vent support, 40/5/22/430, pt is easily stimulated with turns or suctions, will open eyes and unable to follow any command. Pt is sedated on propofol at 10 mcq. Prn labetalol given once for SBP.185 and is effective to bring BP within normal parameter. Pt is tolerating TF well with minimal residual.

## 2019-12-04 NOTE — Progress Notes (Signed)
Subjective: Failed attempts at extubation.   Objective: Current vital signs: BP (!) 172/71   Pulse 70   Temp 99.7 F (37.6 C)   Resp (!) 25   Ht '5\' 7"'  (1.702 m)   Wt 83.4 kg   SpO2 94%   BMI 28.80 kg/m  Vital signs in last 24 hours: Temp:  [99 F (37.2 C)-100.4 F (38 C)] 99.7 F (37.6 C) (07/13 1230) Pulse Rate:  [57-87] 70 (07/13 1230) Resp:  [20-31] 25 (07/13 1230) BP: (131-203)/(56-82) 172/71 (07/13 1230) SpO2:  [93 %-100 %] 94 % (07/13 1230) FiO2 (%):  [40 %] 40 % (07/13 1122)  Intake/Output from previous day: 07/12 0701 - 07/13 0700 In: 1315.5 [I.V.:140.9; NG/GT:430; IV Piggyback:744.6] Out: 1475 [Urine:1475] Intake/Output this shift: Total I/O In: 119.5 [I.V.:14.5; NG/GT:105] Out: 350 [Urine:350] Nutritional status:  Diet Order            Diet NPO time specified  Diet effective now                 Neurologic Exam: Mental Status: Patient does not respond to verbal stimuli.Grimacesto deep sternal rub and attempts to open eyes.  Attempts to localize to pain. Does not follow commands. No verbalizations noted.  Cranial Nerves: II: patient does not respond confrontation bilaterally, pupils right109m, left 328mand reactivebilaterally III,IV,VI: Oculocephalic response present bilaterally.  V,VII: corneal reflexpresentbilaterally VIII: patient does not respond to verbal stimuli IX,X: gag reflexreduced, XI: trapezius strength unable to test bilaterally XII: tongue strength unable to test Motor: Attempts to localize to pain with the right upper extremity.  Minimal movement noted with the LUE.   Sensory: Grimacesto noxious stimuli inallextremities.  Lab Results: Basic Metabolic Panel: Recent Labs  Lab 11/30/19 0109 11/30/19 0533 11/30/19 1306 11/30/19 1306 12/01/19 0422 12/01/19 0422 12/02/19 0415 12/03/19 0330 12/04/19 0444  NA 13983 < > DUPLICATE REQUEST  14382--  136  --  139 141 140  K 4.0   < > DUPLICATE REQUEST  4.4  --  3.9   --  3.4* 4.0 3.8  CL 99   < > DUPLICATE REQUEST  10505--  104  --  105 108 108  CO2 22   < > DUPLICATE REQUEST  25  --  25  --  '26 28 24  ' GLUCOSE [id number]  < > DUPLICATE REQUEST  22673 --  185*  --  125* 140* 175*  BUN 28*   < > DUPLICATE REQUEST  32*  --  32*  --  28* 30* 29*  CREATININE 1.4.19  < > DUPLICATE REQUEST  1.3.79 --  1.52*  --  1.56* 1.62* 1.49*  CALCIUM 1002.4  < > DUPLICATE REQUEST  1009.7 < > 9.4   < > 8.6* 8.4* 8.3*  MG 2.0  --   --   --  1.9  --  1.9 2.3 2.1  PHOS  --   --   --   --   --   --  2.9  --  3.4   < > = values in this interval not displayed.    Liver Function Tests: Recent Labs  Lab 11/30/19 0109 12/01/19 0422 12/02/19 0415 12/03/19 0330  AST 43* '26 24 23  ' ALT '28 23 24 21  ' ALKPHOS 172* 81 84 90  BILITOT 1.3* 0.9 1.0 1.2  PROT 7.9 5.6* 5.3* 5.6*  ALBUMIN 3.5 2.5* 2.6* 2.5*   No results for input(s): LIPASE,  AMYLASE in the last 168 hours. No results for input(s): AMMONIA in the last 168 hours.  CBC: Recent Labs  Lab 11/30/19 0109 12/01/19 0612 12/02/19 0415 12/03/19 0330  WBC 21.8* 17.2* 11.8* 13.2*  NEUTROABS 15.1* 14.1* 8.8* 10.7*  HGB 17.6* 12.7* 12.3* 12.7*  HCT 52.3* 36.9* 35.7* 37.9*  MCV 86.9 85.8 86.2 86.7  PLT 241 204 197 217    Cardiac Enzymes: No results for input(s): CKTOTAL, CKMB, CKMBINDEX, TROPONINI in the last 168 hours.  Lipid Panel: Recent Labs  Lab 11/30/19 0325 11/30/19 0841 12/03/19 0330  CHOL  --  253*  --   TRIG 174*  --  147    CBG: Recent Labs  Lab 12/03/19 1929 12/04/19 0003 12/04/19 0345 12/04/19 0732 12/04/19 1118  GLUCAP 128* 140* 164* 176* 245*    Microbiology: Results for orders placed or performed during the hospital encounter of 11/30/19  SARS Coronavirus 2 by RT PCR (hospital order, performed in [facility] hospital lab) Nasopharyngeal Nasopharyngeal Swab     Status: None   Collection Time: 11/30/19  2:54 AM   Specimen: Nasopharyngeal Swab  Result Value Ref Range Status   SARS  Coronavirus 2 NEGATIVE NEGATIVE Final    Comment: (NOTE) SARS-CoV-2 target nucleic acids are NOT DETECTED.  The SARS-CoV-2 RNA is generally detectable in upper and lower respiratory specimens during the acute phase of infection. The lowest concentration of SARS-CoV-2 viral copies this assay can detect is 250 copies / mL. A negative result does not preclude SARS-CoV-2 infection and should not be used as the sole basis for treatment or other patient management decisions.  A negative result may occur with improper specimen collection / handling, submission of specimen other than nasopharyngeal swab, presence of viral mutation(s) within the areas targeted by this assay, and inadequate number of viral copies (<250 copies / mL). A negative result must be combined with clinical observations, patient history, and epidemiological information.  Fact Sheet for Patients:   StrictlyIdeas.no  Fact Sheet for Healthcare Providers: BankingDealers.co.za  This test is not yet approved or  cleared by the [place] FDA and has been authorized for detection and/or diagnosis of SARS-CoV-2 by FDA under an Emergency Use Authorization (EUA).  This EUA will remain in effect (meaning this test can be used) for the duration of the COVID-19 declaration under Section 564(b)(1) of the Act, 21 U.S.C. section 360bbb-3(b)(1), unless the authorization is terminated or revoked sooner.  Performed at [facility], [address] [place], [place] [address]   MRSA PCR Screening     Status: None   Collection Time: 11/30/19  4:44 AM   Specimen: Nasopharyngeal  Result Value Ref Range Status   MRSA by PCR NEGATIVE NEGATIVE Final    Comment:        The GeneXpert MRSA Assay (FDA approved for NASAL specimens only), is one component of a comprehensive MRSA colonization surveillance program. It is not intended to diagnose MRSA infection nor to guide or monitor  treatment for MRSA infections. Performed at [facility], [address] [place], [place] [address]   CULTURE, BLOOD (ROUTINE X 2) w Reflex to ID Panel     Status: None (Preliminary result)   Collection Time: 11/30/19  5:33 AM   Specimen: Left Antecubital; Blood  Result Value Ref Range Status   Specimen Description LEFT ANTECUBITAL  Final   Special Requests   Final    BOTTLES DRAWN AEROBIC AND ANAEROBIC Blood Culture adequate volume   Culture  Final    NO GROWTH 4 DAYS Performed at [facility], [address] [place], [place] [address]    Report Status PENDING  Incomplete  CULTURE, BLOOD (ROUTINE X 2) w Reflex to ID Panel     Status: None (Preliminary result)   Collection Time: 11/30/19  5:42 AM   Specimen: BLOOD LEFT FOREARM  Result Value Ref Range Status   Specimen Description BLOOD LEFT FOREARM  Final   Special Requests   Final    BOTTLES DRAWN AEROBIC AND ANAEROBIC Blood Culture adequate volume   Culture   Final    NO GROWTH 4 DAYS Performed at [facility], [address] [place], [place] [address]    Report Status PENDING  Incomplete  Culture, respiratory     Status: None   Collection Time: 11/30/19 11:18 AM   Specimen: Tracheal Aspirate; Respiratory  Result Value Ref Range Status   Specimen Description   Final    TRACHEAL ASPIRATE Performed at [facility], [address] [place], [place] [address]    Special Requests   Final    NONE Performed at [facility], [address] [place], [place] [address]    Gram Stain   Final    FEW WBC PRESENT, PREDOMINANTLY PMN FEW GRAM POSITIVE COCCI RARE YEAST    Culture   Final    FEW Consistent with normal respiratory flora. Performed at [facility], [place] [address], [place], [place] [address]    Report Status 12/02/2019 FINAL  Final    Coagulation Studies: No results for input(s): LABPROT, INR in the last 72 hours.  Imaging: No results  found.  Medications:  I have reviewed the patient's current medications. Scheduled: . chlorhexidine gluconate (MEDLINE KIT)  15 mL Mouth Rinse BID  . Chlorhexidine Gluconate Cloth  6 each Topical Daily  . docusate  100 mg Oral BID  . free water  200 mL Per Tube Q4H  . insulin aspart  0-15 Units Subcutaneous Q4H  . insulin glargine  12 Units Subcutaneous QHS  . mouth rinse  15 mL Mouth Rinse 10 times per day  . metoprolol tartrate  12.5 mg Oral BID  . polyethylene glycol  17 g Oral Daily  . rivaroxaban  15 mg Oral Q supper    Assessment/Plan: 84 y.o.malewith a history of A. fib on Xarelto, diastolic CHF, CKD, diabetes, bilateral BKA's, peripheral vascular disease, sleep apnea who presents for hyperglycemia and syncope. Had witnessed seizure receiving 65m of Ativan. Currently intubated and sedated. Patient with no documented history of seizures. Suspect seizure activity was provoked due decreased seizure threshold in the setting of prior stroke with current multiple medical issues including metabolic abnormalities, infection and hyperglycemia.   - MRI if possible - pt is off sedation and not following commands.  - s/p discussion with wife at bedside - pt is off sedation

## 2019-12-04 NOTE — Progress Notes (Signed)
[place] encountered pt.'s wife at rm. doorway tearfully asking if pt.'s dtr. in waiting rm. can come in to rm. since 'they're saying he's not going to make it.'  Ch explained visitor policy and that if pt. is indeed at end of life exceptions in the visitor policy can be made; [place] entered rm. and provided supportive presence at bedside for wife before leaving to check plan of care w/pt.'s RN; RN shared pt.'s son who is MPOA is enroute and will have meeting w/MD upon arrival to determine whether comfort care might be pursued.  [place] attempted to check on pt.'s dtr. in waiting rm. but no one was there.  Ch remains available to assist with and pt./family needs.    12/04/19 1500  Clinical Encounter Type  Visited With Family  Visit Type Follow-up;Psychological support;Social support;Critical Care  Referral From Family  Spiritual Encounters  Spiritual Needs Emotional;Grief support  Stress Factors  Family Stress Factors Major life changes;Loss of control;Loss;Health changes

## 2019-12-05 ENCOUNTER — Inpatient Hospital Stay: Payer: Medicare Other

## 2019-12-05 LAB — CBC WITH DIFFERENTIAL/PLATELET
Abs Immature Granulocytes: 0.06 10*3/uL (ref 0.00–0.07)
Basophils Absolute: 0.1 10*3/uL (ref 0.0–0.1)
Basophils Relative: 1 %
Eosinophils Absolute: 0.5 10*3/uL (ref 0.0–0.5)
Eosinophils Relative: 5 %
HCT: 31.7 % — ABNORMAL LOW (ref 39.0–52.0)
Hemoglobin: 11 g/dL — ABNORMAL LOW (ref 13.0–17.0)
Immature Granulocytes: 1 %
Lymphocytes Relative: 10 %
Lymphs Abs: 1.1 10*3/uL (ref 0.7–4.0)
MCH: 29.6 pg (ref 26.0–34.0)
MCHC: 34.7 g/dL (ref 30.0–36.0)
MCV: 85.2 fL (ref 80.0–100.0)
Monocytes Absolute: 1 10*3/uL (ref 0.1–1.0)
Monocytes Relative: 9 %
Neutro Abs: 8.1 10*3/uL — ABNORMAL HIGH (ref 1.7–7.7)
Neutrophils Relative %: 74 %
Platelets: 209 10*3/uL (ref 150–400)
RBC: 3.72 MIL/uL — ABNORMAL LOW (ref 4.22–5.81)
RDW: 14.3 % (ref 11.5–15.5)
WBC: 10.8 10*3/uL — ABNORMAL HIGH (ref 4.0–10.5)
nRBC: 0 % (ref 0.0–0.2)

## 2019-12-05 LAB — GLUCOSE, CAPILLARY
Glucose-Capillary: 111 mg/dL — ABNORMAL HIGH (ref 70–99)
Glucose-Capillary: 118 mg/dL — ABNORMAL HIGH (ref 70–99)
Glucose-Capillary: 102 mg/dL — ABNORMAL HIGH (ref 70–99)
Glucose-Capillary: 107 mg/dL — ABNORMAL HIGH (ref 70–99)
Glucose-Capillary: 129 mg/dL — ABNORMAL HIGH (ref 70–99)
Glucose-Capillary: 115 mg/dL — ABNORMAL HIGH (ref 70–99)

## 2019-12-05 LAB — BASIC METABOLIC PANEL
Anion gap: 8 (ref 5–15)
BUN: 29 mg/dL — ABNORMAL HIGH (ref 8–23)
CO2: 24 mmol/L (ref 22–32)
Calcium: 7.9 mg/dL — ABNORMAL LOW (ref 8.9–10.3)
Chloride: 109 mmol/L (ref 98–111)
Creatinine, Ser: 1.3 mg/dL — ABNORMAL HIGH (ref 0.61–1.24)
GFR calc Af Amer: 59 mL/min — ABNORMAL LOW (ref >60)
GFR calc non Af Amer: 51 mL/min — ABNORMAL LOW (ref >60)
Glucose, Bld: 120 mg/dL — ABNORMAL HIGH (ref 70–99)
Potassium: 3.7 mmol/L (ref 3.5–5.1)
Sodium: 141 mmol/L (ref 135–145)

## 2019-12-05 LAB — CULTURE, BLOOD (ROUTINE X 2)
Culture: NO GROWTH
Culture: NO GROWTH
Special Requests: ADEQUATE
Special Requests: ADEQUATE

## 2019-12-05 LAB — PHOSPHORUS: Phosphorus: 2.8 mg/dL (ref 2.5–4.6)

## 2019-12-05 LAB — PROCALCITONIN: Procalcitonin: 0.66 ng/mL

## 2019-12-05 LAB — MAGNESIUM: Magnesium: 2.1 mg/dL (ref 1.7–2.4)

## 2019-12-05 MED ORDER — METHYLPREDNISOLONE SODIUM SUCC 40 MG IJ SOLR
20.0000 mg | INTRAMUSCULAR | Status: AC
  Administered 2019-12-05 – 2019-12-06 (×4): 20 mg via INTRAVENOUS
  Filled 2019-12-05 (×4): qty 1

## 2019-12-05 MED ORDER — POTASSIUM CHLORIDE 20 MEQ PO PACK: 20.0000 meq | PACK | Freq: Once | ORAL | Status: DC

## 2019-12-05 MED ORDER — POTASSIUM CHLORIDE 20 MEQ PO PACK
40.0000 meq | PACK | Freq: Once | ORAL | Status: AC
  Administered 2019-12-05: 40 meq
  Filled 2019-12-05: qty 2

## 2019-12-05 MED ORDER — HYDRALAZINE HCL 20 MG/ML IJ SOLN
10.0000 mg | Freq: Four times a day (QID) | INTRAMUSCULAR | Status: DC | PRN
  Administered 2019-12-05 – 2019-12-10 (×3): 10 mg via INTRAVENOUS
  Filled 2019-12-05 (×4): qty 1

## 2019-12-05 MED ORDER — FUROSEMIDE 10 MG/ML IJ SOLN
60.0000 mg | Freq: Once | INTRAMUSCULAR | Status: AC
  Administered 2019-12-05: 60 mg via INTRAVENOUS
  Filled 2019-12-05: qty 6

## 2019-12-05 NOTE — Progress Notes (Addendum)
   12/05/19 1400  Clinical Encounter Type  Visited With Patient and family together  Visit Type Initial  Referral From Family  Consult/Referral To Chaplain  As chaplain was walking through ICU patient's son stopped her asking her to come into his father's room. Son asked about last rites for his father. Chaplain told him that she would see about getting a priest here tomorrow. Chaplain Gerald Stabs will look into getting a priest here. Chaplain will follow up tomorrow.

## 2019-12-05 NOTE — Progress Notes (Signed)
Pt resting in bed, sedated on vent at this time. No s/s of distress noted. Propofol gtt infusing. Pt is afebrile. Failed SBT this morning, family is aware and were at bedside during trial. Will attempt again tomorrow and transition to comfort care if patient continues to fail. Pt is SB on monitor but all VSS at this time.

## 2019-12-05 NOTE — Progress Notes (Addendum)
[place] visited pt. per page from ICU secretary and referral from [facility]; pt. intubated and sedated, son at bedside.  Son is requesting Catholic priest to come administer Last Rites for pt.  Son says pt. was raised Catholic and that he and his wife were married in the [employer], even though wife and children are Theatre manager.  Son said family meeting was held yesterday --> pt.'s condition and prognosis were discussed.  Pt. had vent weaning trial yesterday and acc. to son was able to breathe on his own and respond by nodding to questions; during this time family asked if pt. would want to be on breathing tube for the rest of his life and pt. clearly indicated he did not want this.  Pt. was not able to tolerate weaning trial after about a half hour, son shared, and so pt. had to have vent support resumed.  Plan is to do one-way extubation tomorrow morning and see if pt is able to tolerate; per [facility] conversation w/Fr. Paul @ [employer], priest will be there at [place]. Son says 'I'm trying to be strong for my mom and sister'; he expressed that making the decision to stop ventilator support has been very difficult.  CH will pass along referral to [place] tomorrow AM for family support, and CH remains available this afternoon/evening as needed.

## 2019-12-05 NOTE — Progress Notes (Signed)
Dr. Mortimer Fries notified that pt has been off of propofol gtt for about and hour and has been on SBT for about 20 minutes. HR is stable but BP is elevated. Pt is getting restless and does not follow commands. Dr. Mortimer Fries says he will be here shortly to assess pt and talk to family.

## 2019-12-05 NOTE — Consult Note (Addendum)
[place] for Electrolyte Monitoring and Replacement   Recent Labs: Potassium (mmol/L)  Date Value  12/05/2019 3.7  01/20/2013 3.5   Magnesium (mg/dL)  Date Value  12/05/2019 2.1   Calcium (mg/dL)  Date Value  12/05/2019 7.9 (L)   Calcium, Total (mg/dL)  Date Value  01/20/2013 9.4   Albumin (g/dL)  Date Value  12/03/2019 2.5 (L)  01/19/2013 3.7   Phosphorus (mg/dL)  Date Value  12/05/2019 2.8   Sodium (mmol/L)  Date Value  12/05/2019 141  01/20/2013 140   Corrected Ca: 9.6 mg/dL  Assessment:85 year old male with a past medical history significant for atrial fibrillation on Xarelto, HFpEF, CKD, diabetes mellitus, bilateral BKA's, PVD, sleep apnea, and COVID-19 infection in September 2020 who presents to [facility] ED on 11/30/2019 due to a seizure.  Patient is receiving nutrition via continuous tube feeds + free water flushes 200 mL q4h. Good UOP, bowel movement yesterday.  Goal of Therapy:  Potassium 4.0 - 5.1 mmol/L Magnesium 2.0 - 2.4 mg/dL All Other Electrolytes WNL  Plan:   K 3.7, will give KCl 40 mEq via tube x1  Re-check electrolytes in AM 7/15  Benita Gutter 12/05/2019 8:46 AM

## 2019-12-05 NOTE — Progress Notes (Signed)
Propofol gtt restarted at this time per Dr. Mortimer Fries, RT placing pt back on vent settings. Pt's HR is in the 140s reading VTach. Family is aware. BP remains elevated and pt is anxious. Propofol gtt restarted at 9mcg/hr.

## 2019-12-05 NOTE — Progress Notes (Signed)
CRITICAL CARE NOTE 85 year old male with a past medical history significant for atrial fibrillation on Xarelto, HFpEF, CKD, diabetes mellitus, bilateral BKA's, PVD, sleep apnea, and COVID-19 infection in September 2020  PCCM is asked to admit the pt to ICU for further workup and treatment of Tonic-Clonic seizure, Acute Hypoxic Hypercapnic Respiratory Failure secondary to Aspiration Pneumonia &Pulmonary Edema, A-fib w/ RVR, intermittent nonsustained V-tach, and questionable NSTEMI. Cardiology and Neurology are consulted.   11/30/19- Patient remains on MV weaning FiO2 to 80%, s/p TTE this AM, plan for EEG tonight. Discussed short term care plan with neurology Dr Doy Mince. Tracheal aspirate with thick solid chunks of discolored material concerning for aspiration.INTUBATED/SEDATED  12/01/19- Patient improved , FiO2 50% 12/02/19 - patient is further weaned on FIO2 , he did not tolerate SBT, had sustained VT today with pulse responsive to beta blockade. Will re-attempt SBT tommorow  7/11 failed weaning trials ?family 7/12 remains on vent 7/13 failed weaning trial, multiorgan failure 7/13 failed weaning trials 7/13 patient made DNR by family   CC  follow up respiratory failure  SUBJECTIVE Patient remains critically ill Prognosis is guarded   BP (!) 114/53   Pulse (!) 50   Temp 98.1 F (36.7 C)   Resp (!) 22   Ht _0  (1.702 m)   Wt 83.4 kg   SpO2 94%   BMI 28.80 kg/m    I/O last 3 completed shifts: In: 1230.7 [I.V.:91.4; NG/GT:500; IV Piggyback:639.2] Out: 1610 [Urine:1395] No intake/output data recorded.  SpO2: 94 % FiO2 (%): 40 %  Estimated body mass index is 28.8 kg/m as calculated from the following:   Height as of this encounter: _1  (1.702 m).   Weight as of this encounter: 83.4 kg.  SIGNIFICANT EVENTS   REVIEW OF SYSTEMS  PATIENT IS UNABLE TO PROVIDE COMPLETE REVIEW OF SYSTEMS DUE TO SEVERE CRITICAL ILLNESS   Pressure Injury 02/08/19 Buttocks  Right;Lower Stage II -  Partial thickness loss of dermis presenting as a shallow open ulcer with a red, pink wound bed without slough. (Active)  02/08/19 1610  Location: Buttocks  Location Orientation: Right;Lower  Staging: Stage II -  Partial thickness loss of dermis presenting as a shallow open ulcer with a red, pink wound bed without slough.  Wound Description (Comments):   Present on Admission: Yes     Pressure Injury 02/08/19 Scrotum Posterior;Medial Stage II -  Partial thickness loss of dermis presenting as a shallow open ulcer with a red, pink wound bed without slough. (Active)  02/08/19 1611  Location: Scrotum  Location Orientation: Posterior;Medial  Staging: Stage II -  Partial thickness loss of dermis presenting as a shallow open ulcer with a red, pink wound bed without slough.  Wound Description (Comments):   Present on Admission: Yes      PHYSICAL EXAMINATION:  GENERAL:critically ill appearing, +resp distress HEAD: Normocephalic, atraumatic.  EYES: Pupils equal, round, reactive to light.  No scleral icterus.  MOUTH: Moist mucosal membrane. NECK: Supple.  PULMONARY: +rhonchi, +wheezing CARDIOVASCULAR: S1 and S2. Regular rate and rhythm. No murmurs, rubs, or gallops.  GASTROINTESTINAL: Soft, nontender, -distended.  Positive bowel sounds.   MUSCULOSKELETAL: b/l BKA NEUROLOGIC: obtunded, GCS<8 SKIN:intact,warm,dry  MEDICATIONS: I have reviewed all medications and confirmed regimen as documented   CULTURE RESULTS   Recent Results (from the past 240 hour(s))  SARS Coronavirus 2 by RT PCR (hospital order, performed in [facility] hospital lab) Nasopharyngeal Nasopharyngeal Swab     Status: None   Collection Time: 11/30/19  2:54 AM   Specimen: Nasopharyngeal Swab  Result Value Ref Range Status   SARS Coronavirus 2 NEGATIVE NEGATIVE Final    Comment: (NOTE) SARS-CoV-2 target nucleic acids are NOT DETECTED.  The SARS-CoV-2 RNA is generally detectable in upper and  lower respiratory specimens during the acute phase of infection. The lowest concentration of SARS-CoV-2 viral copies this assay can detect is 250 copies / mL. A negative result does not preclude SARS-CoV-2 infection and should not be used as the sole basis for treatment or other patient management decisions.  A negative result may occur with improper specimen collection / handling, submission of specimen other than nasopharyngeal swab, presence of viral mutation(s) within the areas targeted by this assay, and inadequate number of viral copies (<250 copies / mL). A negative result must be combined with clinical observations, patient history, and epidemiological information.  Fact Sheet for Patients:   StrictlyIdeas.no  Fact Sheet for Healthcare Providers: BankingDealers.co.za  This test is not yet approved or  cleared by the [place] FDA and has been authorized for detection and/or diagnosis of SARS-CoV-2 by FDA under an Emergency Use Authorization (EUA).  This EUA will remain in effect (meaning this test can be used) for the duration of the COVID-19 declaration under Section 564(b)(1) of the Act, 21 U.S.C. section 360bbb-3(b)(1), unless the authorization is terminated or revoked sooner.  Performed at [facility], [address] [place], [place] [address]   MRSA PCR Screening     Status: None   Collection Time: 11/30/19  4:44 AM   Specimen: Nasopharyngeal  Result Value Ref Range Status   MRSA by PCR NEGATIVE NEGATIVE Final    Comment:        The GeneXpert MRSA Assay (FDA approved for NASAL specimens only), is one component of a comprehensive MRSA colonization surveillance program. It is not intended to diagnose MRSA infection nor to guide or monitor treatment for MRSA infections. Performed at [facility], [address] [place], [place] [address]   CULTURE, BLOOD (ROUTINE X 2) w Reflex to ID Panel      Status: None   Collection Time: 11/30/19  5:33 AM   Specimen: Left Antecubital; Blood  Result Value Ref Range Status   Specimen Description LEFT ANTECUBITAL  Final   Special Requests   Final    BOTTLES DRAWN AEROBIC AND ANAEROBIC Blood Culture adequate volume   Culture   Final    NO GROWTH 5 DAYS Performed at [facility], [address] [place], [place] [address]    Report Status 12/05/2019 FINAL  Final  CULTURE, BLOOD (ROUTINE X 2) w Reflex to ID Panel     Status: None   Collection Time: 11/30/19  5:42 AM   Specimen: BLOOD LEFT FOREARM  Result Value Ref Range Status   Specimen Description BLOOD LEFT FOREARM  Final   Special Requests   Final    BOTTLES DRAWN AEROBIC AND ANAEROBIC Blood Culture adequate volume   Culture   Final    NO GROWTH 5 DAYS Performed at [facility], [address] [place], [place] [address]    Report Status 12/05/2019 FINAL  Final  Culture, respiratory     Status: None   Collection Time: 11/30/19 11:18 AM   Specimen: Tracheal Aspirate; Respiratory  Result Value Ref Range Status   Specimen Description   Final    TRACHEAL ASPIRATE Performed at [facility], [address] [place], [place] [address]    Special Requests  Final    NONE Performed at [facility], [place], [place] [address]    Gram Stain   Final    FEW WBC PRESENT, PREDOMINANTLY PMN FEW GRAM POSITIVE COCCI RARE YEAST    Culture   Final    FEW Consistent with normal respiratory flora. Performed at [facility], [place] [address], [place],  [address]    Report Status 12/02/2019 FINAL  Final          IMAGING    DG Chest Port 1 View  Result Date: 12/05/2019 CLINICAL DATA:  Acute respiratory failure EXAM: PORTABLE CHEST 1 VIEW COMPARISON:  Four days ago FINDINGS: Endotracheal tube tip is halfway between the clavicular heads and carina. The enteric tube reaches the stomach. Cardiomegaly and vascular pedicle  widening. Increased hazy density on both sides, likely increasing pleural fluid. There was also airspace disease on a recent chest CT. Negative for pneumothorax. IMPRESSION: 1. Unremarkable hardware positioning. 2. Increased hazy opacity of the bilateral lower chest favoring pleural fluid. There could also be worsening of airspace disease seen on a 11/30/2019 chest CT. Electronically Signed   By: Monte Fantasia M.D.   On: 12/05/2019 07:19     Nutrition Status: Nutrition Problem: Inadequate oral intake Etiology: inability to eat (pt sedated and ventilated) Signs/Symptoms: NPO status     BMP Latest Ref Rng & Units 12/05/2019 12/04/2019 12/03/2019  Glucose 70 - 99 mg/dL 120(H) 175(H) 140(H)  BUN 8 - 23 mg/dL 29(H) 29(H) 30(H)  Creatinine 0.61 - 1.24 mg/dL 1.30(H) 1.49(H) 1.62(H)  Sodium 135 - 145 mmol/L 141 140 141  Potassium 3.5 - 5.1 mmol/L 3.7 3.8 4.0  Chloride 98 - 111 mmol/L 109 108 108  CO2 22 - 32 mmol/L _0 Calcium 8.9 - 10.3 mg/dL 7.9(L) 8.3(L) 8.4(L)     Indwelling Urinary Catheter continued, requirement due to   Reason to continue Indwelling Urinary Catheter strict Intake/Output monitoring for hemodynamic instability         Ventilator continued, requirement due to severe respiratory failure   Ventilator Sedation RASS 0 to -2      ASSESSMENT AND PLAN SYNOPSIS 85 yo WM admitted for severe resp failure and acute encephalopathy and aspiration pneumonia and sepsis with seizures, complicated by acute diastolic heart failure with cardio-renal syndrome and failure to wean from vent   Severe ACUTE Hypoxic and Hypercapnic Respiratory Failure -continue Full MV support -continue Bronchodilator Therapy -Wean Fio2 and PEEP as tolerated -will perform SAT/SBT when respiratory parameters are met -VAP/VENT bundle implementation  ACUTE DIASTOLIC CARDIAC FAILURE- -oxygen as needed -Lasix as tolerated   Morbid obesity, possible OSA.   Will certainly impact respiratory  mechanics, ventilator weaning   ACUTE KIDNEY INJURY/Renal Failure -continue Foley Catheter-assess need -Avoid nephrotoxic agents -Follow urine output, BMP -Ensure adequate renal perfusion, optimize oxygenation -Renal dose medications     NEUROLOGY Acute toxic metabolic encephalopathy, need for sedation Goal RASS -1  SHOCK-SEPSIS/HYPOVOLUMIC/CARDIOGENIC -use vasopressors to keep MAP>65  CARDIAC ICU monitoring  ID -continue IV abx as prescibed -follow up cultures  GI GI PROPHYLAXIS as indicated  DIET-->TF's as tolerated Constipation protocol as indicated  ENDO - will use ICU hypoglycemic\Hyperglycemia protocol if indicated     ELECTROLYTES -follow labs as needed -replace as needed -pharmacy consultation and following   DVT/GI PRX ordered and assessed TRANSFUSIONS AS NEEDED MONITOR FSBS I Assessed the need for Labs I Assessed the need for Foley I Assessed the need for Central Venous Line Family  Discussion when available I Assessed the need for Mobilization I made an Assessment of medications to be adjusted accordingly Safety Risk assessment completed   CASE DISCUSSED IN MULTIDISCIPLINARY ROUNDS WITH ICU TEAM  Critical Care Time devoted to patient care services described in this note is 36 minutes.   Overall, patient is critically ill, prognosis is guarded.  Patient with Multiorgan failure and at high risk for cardiac arrest and death.   Patient now DNR, plan for one way extubation at some point in time  Cipriana Biller Patricia Pesa, M.D.  [facility]  Medical Director [place] Director [facility] Cardio-Pulmonary Department

## 2019-12-05 NOTE — Progress Notes (Signed)
Dr. Mortimer Fries had long discussing with patient's son, daughter, and wife after failing weaning trial today. Family agree that they will do one more SBT tomorrow and will then transition to comfort care if pt does not do well. Family has requested for other family members be able to visit before tomorrow. Charge RN and Dr. Mortimer Fries agree to follow end of life visitor policy and allow 3 visitors at a time. Pt's VSS at this time, afib on monitor.

## 2019-12-06 DIAGNOSIS — R569 Unspecified convulsions: Secondary | ICD-10-CM | POA: Diagnosis not present

## 2019-12-06 DIAGNOSIS — Z7189 Other specified counseling: Secondary | ICD-10-CM | POA: Diagnosis not present

## 2019-12-06 DIAGNOSIS — I5031 Acute diastolic (congestive) heart failure: Secondary | ICD-10-CM

## 2019-12-06 DIAGNOSIS — J690 Pneumonitis due to inhalation of food and vomit: Secondary | ICD-10-CM | POA: Diagnosis not present

## 2019-12-06 LAB — CBC
HCT: 37.2 % — ABNORMAL LOW (ref 39.0–52.0)
Hemoglobin: 12.8 g/dL — ABNORMAL LOW (ref 13.0–17.0)
MCH: 29.6 pg (ref 26.0–34.0)
MCHC: 34.4 g/dL (ref 30.0–36.0)
MCV: 86.1 fL (ref 80.0–100.0)
Platelets: 278 10*3/uL (ref 150–400)
RBC: 4.32 MIL/uL (ref 4.22–5.81)
RDW: 14.1 % (ref 11.5–15.5)
WBC: 8.4 10*3/uL (ref 4.0–10.5)
nRBC: 0 % (ref 0.0–0.2)

## 2019-12-06 LAB — BASIC METABOLIC PANEL
Anion gap: 9 (ref 5–15)
BUN: 34 mg/dL — ABNORMAL HIGH (ref 8–23)
CO2: 23 mmol/L (ref 22–32)
Calcium: 8.4 mg/dL — ABNORMAL LOW (ref 8.9–10.3)
Chloride: 107 mmol/L (ref 98–111)
Creatinine, Ser: 1.56 mg/dL — ABNORMAL HIGH (ref 0.61–1.24)
GFR calc Af Amer: 47 mL/min — ABNORMAL LOW (ref >60)
GFR calc non Af Amer: 41 mL/min — ABNORMAL LOW (ref >60)
Glucose, Bld: 165 mg/dL — ABNORMAL HIGH (ref 70–99)
Potassium: 4.9 mmol/L (ref 3.5–5.1)
Sodium: 139 mmol/L (ref 135–145)

## 2019-12-06 LAB — PHOSPHORUS: Phosphorus: 4.9 mg/dL — ABNORMAL HIGH (ref 2.5–4.6)

## 2019-12-06 LAB — GLUCOSE, CAPILLARY
Glucose-Capillary: 129 mg/dL — ABNORMAL HIGH (ref 70–99)
Glucose-Capillary: 131 mg/dL — ABNORMAL HIGH (ref 70–99)
Glucose-Capillary: 150 mg/dL — ABNORMAL HIGH (ref 70–99)
Glucose-Capillary: 156 mg/dL — ABNORMAL HIGH (ref 70–99)
Glucose-Capillary: 161 mg/dL — ABNORMAL HIGH (ref 70–99)
Glucose-Capillary: 174 mg/dL — ABNORMAL HIGH (ref 70–99)
Glucose-Capillary: 186 mg/dL — ABNORMAL HIGH (ref 70–99)

## 2019-12-06 LAB — MAGNESIUM: Magnesium: 2.1 mg/dL (ref 1.7–2.4)

## 2019-12-06 LAB — TRIGLYCERIDES: Triglycerides: 140 mg/dL (ref <150)

## 2019-12-06 MED ORDER — ORAL CARE MOUTH RINSE
15.0000 mL | Freq: Two times a day (BID) | OROMUCOSAL | Status: DC
  Administered 2019-12-06 – 2019-12-11 (×7): 15 mL via OROMUCOSAL

## 2019-12-06 MED ORDER — FUROSEMIDE 10 MG/ML IJ SOLN
60.0000 mg | Freq: Once | INTRAMUSCULAR | Status: AC
  Administered 2019-12-06: 60 mg via INTRAVENOUS
  Filled 2019-12-06: qty 6

## 2019-12-06 MED ORDER — MORPHINE SULFATE (PF) 2 MG/ML IV SOLN
2.0000 mg | INTRAVENOUS | Status: DC | PRN
  Administered 2019-12-06: 2 mg via INTRAVENOUS
  Filled 2019-12-06: qty 1
  Filled 2019-12-06: qty 2

## 2019-12-06 NOTE — Progress Notes (Signed)
°   12/06/19 1200  Clinical Encounter Type  Visited With Patient and family together  Visit Type Follow-up  Referral From Chaplain  Consult/Referral To Chaplain  Chaplain walked by the room and family was sitting around the bed. They told chaplain that things were going better than they expected. Patient's numbers appear to be going in the right direction and they are enthusiastic. Chaplain will check back in on them.

## 2019-12-06 NOTE — Consult Note (Signed)
[place] for Electrolyte Monitoring and Replacement   Recent Labs: Potassium (mmol/L)  Date Value  12/06/2019 4.9  01/20/2013 3.5   Magnesium (mg/dL)  Date Value  12/06/2019 2.1   Calcium (mg/dL)  Date Value  12/06/2019 8.4 (L)   Calcium, Total (mg/dL)  Date Value  01/20/2013 9.4   Albumin (g/dL)  Date Value  12/03/2019 2.5 (L)  01/19/2013 3.7   Phosphorus (mg/dL)  Date Value  12/06/2019 4.9 (H)   Sodium (mmol/L)  Date Value  12/06/2019 139  01/20/2013 140   Corrected Ca: 9.6 mg/dL  Assessment:85 year old male with a past medical history significant for atrial fibrillation on Xarelto, HFpEF, CKD, diabetes mellitus, bilateral BKA's, PVD, sleep apnea, and COVID-19 infection in September 2020 who presents to [facility] ED on 11/30/2019 due to a seizure.  Patient is receiving nutrition via continuous tube feeds + free water flushes 200 mL q4h. Good UOP with IV lasix yesterday, labs indicate component of hemoconcentration. Repeat dose of IV lasix today. Having regular bowel movements. Patient extubated this morning.   Goal of Therapy:  Potassium 4.0 - 5.1 mmol/L Magnesium 2.0 - 2.4 mg/dL All Other Electrolytes WNL  Plan:   No electrolyte repletion indicated today  Re-check electrolytes in AM 7/16  Benita Gutter 12/06/2019 8:35 AM

## 2019-12-06 NOTE — Progress Notes (Signed)
Pt extubated without complications placed on 2lpm [place], sats 94%, respiratory rate 20/min, no stridor noted. Will continue monitor.

## 2019-12-06 NOTE — Progress Notes (Signed)
   12/06/19 1015  Clinical Encounter Type  Visited With Patient and family together  Visit Type Follow-up  Referral From Chaplain  Consult/Referral To Chaplain  Chaplain escorted Father Eddie Dibbles to patient's room. Father Eddie Dibbles gave patient Last Rites and left. Chaplain stayed with family, silently praying.

## 2019-12-06 NOTE — Progress Notes (Signed)
Palliative: [name] is lying quietly in bed.  He was extubated this morning, and is saturating 93 to 95% on nasal cannula.  He continues to appear chronically ill and frail.  [name] will briefly open his eyes, he does not make eye contact.  He is able to tell me his name, but nothing else.  He is unable to make his basic needs known.  There is no family at bedside at this time.  At this point, the plan is for DNR status, do not reintubate, but continue full scope treatment otherwise.  Family is requesting time for outcomes.  Detailed discussion with bedside nursing staff who share they feel that family is hopeful now that [name] is breathing unaided.  At this point, time for outcomes.  [name] body will tell us if he is able to recover.   Conference with attending, bedside nursing staff, transition of care team related to patient condition, needs, goals of care.  Plan: DNR but continue full scope treatment for now.  Time for outcomes.  25 minutes  Quinn Axe, NP Palliative medicine team Team phone [phone] Greater than 50% of this time was spent counseling and/or coordinating care related to the above assessment and plan.

## 2019-12-06 NOTE — Progress Notes (Signed)
CRITICAL CARE NOTE 85 year old male with a past medical history significant for atrial fibrillation on Xarelto, HFpEF, CKD, diabetes mellitus, bilateral BKA's, PVD, sleep apnea, and COVID-19 infection in September 2020  PCCM is asked to admit the pt to ICU for further workup and treatment of Tonic-Clonic seizure, Acute Hypoxic Hypercapnic Respiratory Failure secondary to Aspiration Pneumonia &Pulmonary Edema, A-fib w/ RVR, intermittent nonsustained V-tach, and questionable NSTEMI. Cardiology and Neurology are consulted.   11/30/19- Patient remains on MV weaning FiO2 to 80%, s/p TTE this AM, plan for EEG tonight. Discussed short term care plan with neurology Dr Doy Mince. Tracheal aspirate with thick solid chunks of discolored material concerning for aspiration.INTUBATED/SEDATED  12/01/19- Patient improved , FiO2 50% 12/02/19 - patient is further weaned on FIO2 , he did not tolerate SBT, had sustained VT today with pulse responsive to beta blockade. Will re-attempt SBT tommorow  7/11 failed weaning trials ?family 7/12 remains on vent 7/13 failed weaning trial, multiorgan failure 7/13 failed weaning trials 7/13 patient made DNR by family 7/14 patient with severe multiorgan failure, failed Weaning trials-witnessed by family     CC  follow up respiratory failure  SUBJECTIVE Patient remains critically ill Prognosis is guarded   BP 137/73   Pulse (!) 49   Temp (!) 97.3 F (36.3 C)   Resp (!) 22   Ht 5' 7.01" (1.702 m)   Wt 83.4 kg   SpO2 95%   BMI 28.79 kg/m    I/O last 3 completed shifts: In: 1887.6 [I.V.:495.6; NG/GT:600; IV Piggyback:792] Out: 2450 [Urine:2450] No intake/output data recorded.  SpO2: 95 % FiO2 (%): 40 %  Estimated body mass index is 28.79 kg/m as calculated from the following:   Height as of this encounter: 5' 7.01" (1.702 m).   Weight as of this encounter: 83.4 kg.  SIGNIFICANT EVENTS   REVIEW OF SYSTEMS  PATIENT IS UNABLE TO PROVIDE  COMPLETE REVIEW OF SYSTEMS DUE TO SEVERE CRITICAL ILLNESS   Pressure Injury 02/08/19 Buttocks Right;Lower Stage II -  Partial thickness loss of dermis presenting as a shallow open ulcer with a red, pink wound bed without slough. (Active)  02/08/19 1610  Location: Buttocks  Location Orientation: Right;Lower  Staging: Stage II -  Partial thickness loss of dermis presenting as a shallow open ulcer with a red, pink wound bed without slough.  Wound Description (Comments):   Present on Admission: Yes     Pressure Injury 02/08/19 Scrotum Posterior;Medial Stage II -  Partial thickness loss of dermis presenting as a shallow open ulcer with a red, pink wound bed without slough. (Active)  02/08/19 1611  Location: Scrotum  Location Orientation: Posterior;Medial  Staging: Stage II -  Partial thickness loss of dermis presenting as a shallow open ulcer with a red, pink wound bed without slough.  Wound Description (Comments):   Present on Admission: Yes      PHYSICAL EXAMINATION:  GENERAL:critically ill appearing, +resp distress HEAD: Normocephalic, atraumatic.  EYES: Pupils equal, round, reactive to light.  No scleral icterus.  MOUTH: Moist mucosal membrane. NECK: Supple.  PULMONARY: +rhonchi, +wheezing CARDIOVASCULAR: S1 and S2. Regular rate and rhythm. No murmurs, rubs, or gallops.  GASTROINTESTINAL: Soft, nontender, -distended.  Positive bowel sounds.   MUSCULOSKELETAL: BKA NEUROLOGIC: obtunded, GCS<8 SKIN:intact,warm,dry  MEDICATIONS: I have reviewed all medications and confirmed regimen as documented   CULTURE RESULTS   Recent Results (from the past 240 hour(s))  SARS Coronavirus 2 by RT PCR (hospital order, performed in [facility] hospital lab) Nasopharyngeal Nasopharyngeal Swab  Status: None   Collection Time: 11/30/19  2:54 AM   Specimen: Nasopharyngeal Swab  Result Value Ref Range Status   SARS Coronavirus 2 NEGATIVE NEGATIVE Final    Comment: (NOTE) SARS-CoV-2 target  nucleic acids are NOT DETECTED.  The SARS-CoV-2 RNA is generally detectable in upper and lower respiratory specimens during the acute phase of infection. The lowest concentration of SARS-CoV-2 viral copies this assay can detect is 250 copies / mL. A negative result does not preclude SARS-CoV-2 infection and should not be used as the sole basis for treatment or other patient management decisions.  A negative result may occur with improper specimen collection / handling, submission of specimen other than nasopharyngeal swab, presence of viral mutation(s) within the areas targeted by this assay, and inadequate number of viral copies (<250 copies / mL). A negative result must be combined with clinical observations, patient history, and epidemiological information.  Fact Sheet for Patients:   StrictlyIdeas.no  Fact Sheet for Healthcare Providers: BankingDealers.co.za  This test is not yet approved or  cleared by the [place] FDA and has been authorized for detection and/or diagnosis of SARS-CoV-2 by FDA under an Emergency Use Authorization (EUA).  This EUA will remain in effect (meaning this test can be used) for the duration of the COVID-19 declaration under Section 564(b)(1) of the Act, 21 U.S.C. section 360bbb-3(b)(1), unless the authorization is terminated or revoked sooner.  Performed at [facility], [address] [place], [place] [address]   MRSA PCR Screening     Status: None   Collection Time: 11/30/19  4:44 AM   Specimen: Nasopharyngeal  Result Value Ref Range Status   MRSA by PCR NEGATIVE NEGATIVE Final    Comment:        The GeneXpert MRSA Assay (FDA approved for NASAL specimens only), is one component of a comprehensive MRSA colonization surveillance program. It is not intended to diagnose MRSA infection nor to guide or monitor treatment for MRSA infections. Performed at [facility], [address] [place], [place] [address]   CULTURE, BLOOD (ROUTINE X 2) w Reflex to ID Panel     Status: None   Collection Time: 11/30/19  5:33 AM   Specimen: Left Antecubital; Blood  Result Value Ref Range Status   Specimen Description LEFT ANTECUBITAL  Final   Special Requests   Final    BOTTLES DRAWN AEROBIC AND ANAEROBIC Blood Culture adequate volume   Culture   Final    NO GROWTH 5 DAYS Performed at [facility], [address] [place], [place] [address]    Report Status 12/05/2019 FINAL  Final  CULTURE, BLOOD (ROUTINE X 2) w Reflex to ID Panel     Status: None   Collection Time: 11/30/19  5:42 AM   Specimen: BLOOD LEFT FOREARM  Result Value Ref Range Status   Specimen Description BLOOD LEFT FOREARM  Final   Special Requests   Final    BOTTLES DRAWN AEROBIC AND ANAEROBIC Blood Culture adequate volume   Culture   Final    NO GROWTH 5 DAYS Performed at [facility], [address] [place], [place] [address]    Report Status 12/05/2019 FINAL  Final  Culture, respiratory     Status: None   Collection Time: 11/30/19 11:18 AM   Specimen: Tracheal Aspirate; Respiratory  Result Value Ref Range Status   Specimen Description   Final    TRACHEAL ASPIRATE Performed at [facility], [place],  [place] [address]    Special Requests   Final    NONE Performed at [facility], [place], [place] [address]    Gram Stain   Final    FEW WBC PRESENT, PREDOMINANTLY PMN FEW GRAM POSITIVE COCCI RARE YEAST    Culture   Final    FEW Consistent with normal respiratory flora. Performed at [facility], [place] [address] [place],  [address]    Report Status 12/02/2019 FINAL  Final             Indwelling Urinary Catheter continued, requirement due to   Reason to continue Indwelling Urinary Catheter strict Intake/Output monitoring for hemodynamic instability         Ventilator continued, requirement due  to severe respiratory failure   Ventilator Sedation RASS 0 to -2      ASSESSMENT AND PLAN SYNOPSIS  85 yo WM admitted for severe resp failure and acute encephalopathy and aspiration pneumonia and sepsis with seizures, complicated by acute diastolic heart failure with cardio-renal syndrome and failure to wean from vent   Severe ACUTE Hypoxic and Hypercapnic Respiratory Failure -continue Full MV support -continue Bronchodilator Therapy -Wean Fio2 and PEEP as tolerated -VAP/VENT bundle implementation Plan for one way extubation today  ACUTE DIASTOLIC CARDIAC FAILURE-  -oxygen as needed -Lasix as tolerated   Morbid obesity, possible OSA.   Will certainly impact respiratory mechanics, ventilator weaning   ACUTE KIDNEY INJURY/Renal Failure -continue Foley Catheter-assess need -Avoid nephrotoxic agents -Follow urine output, BMP -Ensure adequate renal perfusion, optimize oxygenation -Renal dose medications     NEUROLOGY Acute toxic metabolic encephalopathy, need for sedation  CARDIAC ICU monitoring   GI GI PROPHYLAXIS as indicated   DIET-->TF's as tolerated Constipation protocol as indicated  ENDO - will use ICU hypoglycemic\Hyperglycemia protocol if indicated     ELECTROLYTES -follow labs as needed -replace as needed -pharmacy consultation and following   DVT/GI PRX ordered and assessed TRANSFUSIONS AS NEEDED MONITOR FSBS I Assessed the need for Labs I Assessed the need for Foley I Assessed the need for Central Venous Line Family Discussion when available I Assessed the need for Mobilization I made an Assessment of medications to be adjusted accordingly Safety Risk assessment completed   CASE DISCUSSED IN MULTIDISCIPLINARY ROUNDS WITH ICU TEAM  Critical Care Time devoted to patient care services described in this note is 35 minutes.   Overall, patient is critically ill, prognosis is guarded.  Patient with Multiorgan failure and at high risk for  cardiac arrest and death.   Patient is DNR, plan for one way extubation today  Corrin Parker, M.D.  [facility]  Medical Director [place] Director [facility] Cardio-Pulmonary Department

## 2019-12-07 DIAGNOSIS — J9622 Acute and chronic respiratory failure with hypercapnia: Secondary | ICD-10-CM

## 2019-12-07 DIAGNOSIS — R569 Unspecified convulsions: Secondary | ICD-10-CM | POA: Diagnosis not present

## 2019-12-07 DIAGNOSIS — Z515 Encounter for palliative care: Secondary | ICD-10-CM | POA: Diagnosis not present

## 2019-12-07 DIAGNOSIS — J690 Pneumonitis due to inhalation of food and vomit: Secondary | ICD-10-CM | POA: Diagnosis not present

## 2019-12-07 DIAGNOSIS — J9621 Acute and chronic respiratory failure with hypoxia: Secondary | ICD-10-CM

## 2019-12-07 DIAGNOSIS — Z7189 Other specified counseling: Secondary | ICD-10-CM | POA: Diagnosis not present

## 2019-12-07 LAB — BASIC METABOLIC PANEL
Anion gap: 10 (ref 5–15)
BUN: 39 mg/dL — ABNORMAL HIGH (ref 8–23)
CO2: 24 mmol/L (ref 22–32)
Calcium: 8.4 mg/dL — ABNORMAL LOW (ref 8.9–10.3)
Chloride: 109 mmol/L (ref 98–111)
Creatinine, Ser: 1.59 mg/dL — ABNORMAL HIGH (ref 0.61–1.24)
GFR calc Af Amer: 46 mL/min — ABNORMAL LOW (ref >60)
GFR calc non Af Amer: 40 mL/min — ABNORMAL LOW (ref >60)
Glucose, Bld: 120 mg/dL — ABNORMAL HIGH (ref 70–99)
Potassium: 3.7 mmol/L (ref 3.5–5.1)
Sodium: 143 mmol/L (ref 135–145)

## 2019-12-07 LAB — GLUCOSE, CAPILLARY
Glucose-Capillary: 120 mg/dL — ABNORMAL HIGH (ref 70–99)
Glucose-Capillary: 115 mg/dL — ABNORMAL HIGH (ref 70–99)
Glucose-Capillary: 143 mg/dL — ABNORMAL HIGH (ref 70–99)
Glucose-Capillary: 121 mg/dL — ABNORMAL HIGH (ref 70–99)

## 2019-12-07 LAB — MAGNESIUM: Magnesium: 2.2 mg/dL (ref 1.7–2.4)

## 2019-12-07 MED ORDER — SODIUM CHLORIDE 0.9 % IV SOLN
INTRAVENOUS | Status: DC | PRN
  Administered 2019-12-07 – 2019-12-08 (×3): 250 mL via INTRAVENOUS

## 2019-12-07 MED ORDER — ADULT MULTIVITAMIN W/MINERALS CH
1.0000 | ORAL_TABLET | Freq: Every day | ORAL | Status: DC
  Administered 2019-12-09 – 2019-12-11 (×3): 1 via ORAL
  Filled 2019-12-07 (×4): qty 1

## 2019-12-07 MED ORDER — HALOPERIDOL LACTATE 5 MG/ML IJ SOLN
INTRAMUSCULAR | Status: AC
  Administered 2019-12-07: 2 mg via INTRAVENOUS
  Filled 2019-12-07: qty 1

## 2019-12-07 MED ORDER — HALOPERIDOL LACTATE 5 MG/ML IJ SOLN
2.0000 mg | Freq: Four times a day (QID) | INTRAMUSCULAR | Status: DC | PRN
  Administered 2019-12-10: 2 mg via INTRAVENOUS
  Filled 2019-12-07: qty 1

## 2019-12-07 MED ORDER — NEPRO/CARBSTEADY PO LIQD
237.0000 mL | Freq: Two times a day (BID) | ORAL | Status: DC
  Administered 2019-12-09 – 2019-12-11 (×4): 237 mL via ORAL

## 2019-12-07 NOTE — Progress Notes (Signed)
Palliative: Mr. Dutton is resting quietly in bed.  Nursing staff is at bedside attending to needs.  He is able to make and somewhat keep eye contact.  He is oriented to person and place.  I believe he is able to make his basic needs known.  His wife, Alma Friendly, is at bedside.  We talked about what happened, what brought him into the hospital.  Alma Friendly has no questions at this time, stating that she is encouraged that York Cerise looks better.  York Cerise is able to follow commands, raise his arms from the shoulder.  We talked about the possibility of going to rehab.  They tell me that York Cerise has been to rehab in the past, and would be willing to go again.  Conference with attending, bedside nursing staff, transition of care team related to patient condition, needs, goals of care.  Plan: Continue to treat the treatable but no CPR or reintubation.  CODE STATUS discussions need to be held with Mr. Sehgal when he is able.  Anticipate need for short-term rehab.  25 minutes  Quinn Axe, NP Palliative medicine team Team phone [phone] Greater than 50% of this time spent counseling and coordinating care related to the above assessment and plan.

## 2019-12-07 NOTE — Evaluation (Addendum)
Clinical/Bedside Swallow Evaluation Patient Details  Name: [name] MRN: [id number] Date of Birth: [birth year]  Today's Date: 12/07/2019 Time:      Completed at 38 am  Past Medical History:  Past Medical History:  Diagnosis Date  . Arthritis   . Atrial fibrillation ([place])   . Carcinoma of prostate ([place])   . CHF (congestive heart failure) ([place])   . Chronic kidney disease   . Diabetes mellitus without complication ([place])   . ED (erectile dysfunction)   . Frequent urination   . Hyperlipidemia   . Hypertension   . Moderate mitral insufficiency   . Peripheral vascular disease ([place])   . Pneumonia 04/2016  . Prostate cancer ([place])   . Sleep apnea    OSA--USE C-PAP  . Ulcer of left foot due to type 2 diabetes mellitus ([place])   . Urinary stress incontinence, male    Past Surgical History:  Past Surgical History:  Procedure Laterality Date  . AMPUTATION Left 01/12/2017   Procedure: AMPUTATION BELOW KNEE;  Surgeon: Algernon Huxley, MD;  Location: [facility] ORS;  Service: General;  Laterality: Left;  . AMPUTATION Right 12/27/2018   Procedure: AMPUTATION BELOW KNEE;  Surgeon: Algernon Huxley, MD;  Location: [facility] ORS;  Service: General;  Laterality: Right;  . APLIGRAFT PLACEMENT Left 10/15/2016   Procedure: APLIGRAFT PLACEMENT;  Surgeon: Albertine Patricia, DPM;  Location: [facility] ORS;  Service: Podiatry;  Laterality: Left;  necrotic ulcer  . CHOLECYSTECTOMY    . COLONOSCOPY WITH PROPOFOL N/A 01/02/2019   Procedure: COLONOSCOPY WITH PROPOFOL;  Surgeon: Lucilla Lame, MD;  Location: [facility] ENDOSCOPY;  Service: Endoscopy;  Laterality: N/A;  . COLONOSCOPY WITH PROPOFOL N/A 01/05/2019   Procedure: COLONOSCOPY WITH PROPOFOL;  Surgeon: Lucilla Lame, MD;  Location: [facility] ENDOSCOPY;  Service: Endoscopy;  Laterality: N/A;  . ESOPHAGOGASTRODUODENOSCOPY (EGD) WITH PROPOFOL N/A 01/02/2019   Procedure: ESOPHAGOGASTRODUODENOSCOPY (EGD) WITH PROPOFOL;  Surgeon: Lucilla Lame, MD;  Location: [facility] ENDOSCOPY;  Service:  Endoscopy;  Laterality: N/A;  . EYE SURGERY Bilateral    Cataract Extraction with IOL  . HIP ARTHROPLASTY Right 10/13/2018   Procedure: ARTHROPLASTY BIPOLAR HIP (HEMIARTHROPLASTY);  Surgeon: Earnestine Leys, MD;  Location: [facility] ORS;  Service: Orthopedics;  Laterality: Right;  . IRRIGATION AND DEBRIDEMENT FOOT Left 10/15/2016   Procedure: IRRIGATION AND DEBRIDEMENT FOOT-EXCISIONAL DEBRIDEMENT OF SKIN 3RD, 4TH AND 5TH TOES WITH APPLICATION OF APLIGRAFT;  Surgeon: Albertine Patricia, DPM;  Location: [facility] ORS;  Service: Podiatry;  Laterality: Left;  necrotic,gangrene  . IRRIGATION AND DEBRIDEMENT FOOT Left 12/09/2016   Procedure: IRRIGATION AND DEBRIDEMENT FOOT-MUSCLE/FASCIA, RESECTION OF FOURTH AND FIFTH METATARSAL NECROTIC BONE;  Surgeon: Albertine Patricia, DPM;  Location: [facility] ORS;  Service: Podiatry;  Laterality: Left;  . IRRIGATION AND DEBRIDEMENT FOOT Right 12/23/2018   Procedure: IRRIGATION AND DEBRIDEMENT FOOT and wound vac application;  Surgeon: Samara Deist, DPM;  Location: [facility] ORS;  Service: Podiatry;  Laterality: Right;  . LEFT HEART CATH N/A 06/25/2019   Procedure: Left Heart Cath and Coronary Angiography;  Surgeon: Isaias Cowman, MD;  Location: [place] CV LAB;  Service: Cardiovascular;  Laterality: N/A;  . LEFT HEART CATH AND CORONARY ANGIOGRAPHY N/A 06/05/2018   Procedure: LEFT HEART CATH AND CORONARY ANGIOGRAPHY;  Surgeon: Yolonda Kida, MD;  Location: [place] CV LAB;  Service: Cardiovascular;  Laterality: N/A;  . LOWER EXTREMITY ANGIOGRAPHY Left 08/16/2016   Procedure: Lower Extremity Angiography;  Surgeon: Algernon Huxley, MD;  Location: [place] CV LAB;  Service: Cardiovascular;  Laterality: Left;  . LOWER  EXTREMITY ANGIOGRAPHY Left 09/01/2016   Procedure: Lower Extremity Angiography;  Surgeon: Algernon Huxley, MD;  Location: [place] CV LAB;  Service: Cardiovascular;  Laterality: Left;  . LOWER EXTREMITY ANGIOGRAPHY Left 10/14/2016   Procedure: Lower Extremity  Angiography;  Surgeon: Algernon Huxley, MD;  Location: [place] CV LAB;  Service: Cardiovascular;  Laterality: Left;  . LOWER EXTREMITY ANGIOGRAPHY Left 12/20/2016   Procedure: Lower Extremity Angiography;  Surgeon: Algernon Huxley, MD;  Location: [place] CV LAB;  Service: Cardiovascular;  Laterality: Left;  . LOWER EXTREMITY ANGIOGRAPHY Right 12/18/2018   Procedure: LOWER EXTREMITY ANGIOGRAPHY;  Surgeon: Algernon Huxley, MD;  Location: [place] CV LAB;  Service: Cardiovascular;  Laterality: Right;  . LOWER EXTREMITY INTERVENTION  09/01/2016   Procedure: Lower Extremity Intervention;  Surgeon: Algernon Huxley, MD;  Location: [place] CV LAB;  Service: Cardiovascular;;  . PROSTATE SURGERY     removal  . TONSILLECTOMY     as a child   HPI:  Per admitting H and P: "[name] is a 85 year old male with a past medical history significant for atrial fibrillation on Xarelto, HFpEF, CKD, diabetes mellitus, bilateral BKA's, PVD, sleep apnea, and COVID-19 infection in September 2020 who presents to [facility] ED on 11/30/2019 due to syncope and hyperglycemia.  Patient is currently intubated and sedated with no family present, therefore history is obtained from ED and nursing notes.  Per notes patient had a syncopal episode this evening, and when EMS arrived they found him face down on the floor very confused (he was noted to have a pulse and spontaneous breathing).  CBG was too high to read.  Patient was combative of which he was given 2 mg of IV Versed.  While in route to the hospital with EMS he went into V. tach and received 150 mg of amiodarone.   Assessment / Plan / Recommendation Clinical Impression  Pt presents with mild to moderate dysphagia with inconsistent overt s/s of aspiration. Oral mech exam revealed mild oral motor weakness likely secondary to overall deconditioning. Also noted was a white tongue coating and poorly fitted upper dentures. Pt tolerated several bites of applesauce and several  sips of nectar thick liquid without difficulty with the exception of dentures often moving around. When presented with boluses of thin liquid, Pt presented with inconsistent coughing particularily after multiple sips in a row. Pt was able to masticate solid graham cracker despite the poorly fitted dentures but needed extra time to do so. Rec Dysphagia 1 diet with nectar thick liquids. Pt may have single small sips of thin water as pleasure if requested. ST to follow and provide therapeutic trails of thin liquids and upgraded textures using denture adhesive.  Meds whole in applesauce. Pt was able to follow simple directions, speech was intelligible but with low volume and breathy, hoarse voice likely from extended intubation. Prognosis for toleration of diet and advancement is good. SLP Visit Diagnosis: Dysphagia, oropharyngeal phase (R13.12)    Aspiration Risk  Mild aspiration risk;Moderate aspiration risk    Diet Recommendation Dysphagia 1 (Puree);Nectar-thick liquid   Medication Administration: Whole meds with puree Supervision: Staff to assist with self feeding;Full supervision/cueing for compensatory strategies Compensations: Slow rate;Small sips/bites    Other  Recommendations     Follow up Recommendations Skilled Nursing facility      Frequency and Duration min 2x/week  1 week       Prognosis Prognosis for Safe Diet Advancement: Good      Swallow Study  General Date of Onset: 11/30/19 HPI: Per admitting H and P: "[name] is a 85 year old male with a past medical history significant for atrial fibrillation on Xarelto, HFpEF, CKD, diabetes mellitus, bilateral BKA's, PVD, sleep apnea, and COVID-19 infection in September 2020 who presents to [facility] ED on 11/30/2019 due to syncope and hyperglycemia.  Patient is currently intubated and sedated with no family present, therefore history is obtained from ED and nursing notes.  Per notes patient had a syncopal episode this evening, and  when EMS arrived they found him face down on the floor very confused (he was noted to have a pulse and spontaneous breathing).  CBG was too high to read.  Patient was combative of which he was given 2 mg of IV Versed.  While in route to the hospital with EMS he went into V. tach and received 150 mg of amiodarone. Respiratory Status: Nasal cannula History of Recent Intubation: Yes Length of Intubations (days): 6 days Date extubated: 12/06/19 Behavior/Cognition: Cooperative;Pleasant mood Oral Cavity - Dentition: Dentures, top;Other (Comment) (Top dentures dont fit well since hospitalization. Wife to br) Vision: Impaired for self-feeding Baseline Vocal Quality: Hoarse;Breathy;Low vocal intensity Volitional Cough: Weak    Oral/Motor/Sensory Function Overall Oral Motor/Sensory Function: Within functional limits   Ice Chips Ice chips: Not tested   Thin Liquid Thin Liquid: Impaired Presentation: Cup;Spoon;Straw Pharyngeal  Phase Impairments: Throat Clearing - Delayed;Cough - Delayed    Nectar Thick Nectar Thick Liquid: Within functional limits Presentation: Cup   Honey Thick Honey Thick Liquid: Not tested   Puree Puree: Within functional limits Presentation: Spoon   Solid     Solid: Impaired Oral Phase Impairments: Impaired mastication Oral Phase Functional Implications: Oral residue;Other (comment) (Upper dentures loose)      Lucila Maine 12/07/2019,5:45 PM

## 2019-12-07 NOTE — Progress Notes (Signed)
Nutrition Follow Up Note   DOCUMENTATION CODES:   Not applicable  INTERVENTION:   Nepro Shake po BID, each supplement provides 425 kcal and 19 grams protein  MVI daily   NUTRITION DIAGNOSIS:   Inadequate oral intake related to inability to eat (pt sedated and ventilated) as evidenced by NPO status. -progressing   GOAL:   Patient will meet greater than or equal to 90% of their needs  -previously met with tube feeds, progressing with diet initiation   MONITOR:   PO intake, Supplement acceptance, Labs, Weight trends, Skin, I & O's  ASSESSMENT:   85 year old male with a past medical history significant for atrial fibrillation on Xarelto, HFpEF, CKD, diabetes mellitus, bilateral BKA's, PVD, sleep apnea, and COVID-19 infection in September 2020 who presents to [facility] ED on 11/30/2019 due to syncope and hyperglycemia. Pt found to have hyperosmolar hyperglycemia and aspiration PNA   Pt extubated 7/15. Pt seen by SLP today and placed on a Dysphagia 1/ nectar thick diet. RD will add supplements and MVI to help pt meet his estimated needs. Per chart, pt is fairly weight stable since admit.   Medications reviewed and include: colace, mirialx, pepcid, insulin  Labs reviewed: K 3.7 wnl, BUN 39(H), creat 1.59(H), Mg 2.2 wnl cbgs- 120, 115, 142 x 24 hrs  Diet Order:   Diet Order            DIET - DYS 1 Room service appropriate? Yes; Fluid consistency: Nectar Thick  Diet effective now                EDUCATION NEEDS:   No education needs have been identified at this time  Skin:  Skin Assessment: Reviewed RN Assessment  Last BM:  7/14- type 6  Height:   Ht Readings from Last 1 Encounters:  12/06/19 5' 7.01" (1.702 m)    Weight:   Wt Readings from Last 1 Encounters:  12/07/19 82.9 kg    Ideal Body Weight:  59.2 kg (adjusted for b/l BKAs)  BMI:  Body mass index is 28.62 kg/m.  Estimated Nutritional Needs:   Kcal:  1800-2100kcal/day  Protein:  90-105g/day  Fluid:   1.5-1.8L/day  Koleen Distance MS, RD, LDN Please refer to [facility] for RD and/or RD on-call/weekend/after hours pager

## 2019-12-07 NOTE — Progress Notes (Signed)
Bedside swallow eval today. Full report to follow upon completeion. Rec and ordered Dys 1 diet with nectar thick liquids. Pt may have small sips of thin water if requested as pleasure. Meds in applesauce. Will likely be able to upgrade to less restrictive water in 1-3 days. ST to follow and upgrade as appropriate.

## 2019-12-07 NOTE — Progress Notes (Signed)
PROGRESS NOTE    [name]  JOI:[id number] DOB: 12-18-1934 DOA: 11/30/2019 PCP: Corey Skains, MD    Assessment & Plan:   Active Problems:   Seizures ([place])   Goals of care, counseling/discussion   Palliative care by specialist   Acute hypoxic & hypercapnic respiratory failure: secondary to aspiration pneumonia, sepsis & seizures. Extubated 12/06/19 as per ICU. Continue on supplemental oxygen and wean as tolerated  Aspiration pneumonia: completed course of abxs. Continue on supplemental oxygen and wean as tolerated. Encourage incentive spirometry   DM2: poorly controlled. Hx of b/l BKA. Continue on lantus & SSI   Acute diastolic CHF exacerbation: continue on IV lasix as tolerated. Continue on supplemental oxygen and wean as tolerated   Obesity: BMI 28.6. Would benefit from weight loss   Likely AKI on CKD: baseline Cr/GFR is unknown, currently stage IIIa. Will continue to monitor   Acute toxic metabolic encephalopathy: likely secondary to infection. Oriented to self only today.  Seizures: unknown type. Continue on keppra   DVT prophylaxis: xarelto  Code Status: DNR Family Communication: Disposition Plan:    Consultants:  ICU    Procedures: intubation, ventilation & extubation    Antimicrobials:    Subjective: Pt is oriented to person only.  Objective: Vitals:   12/07/19 0300 12/07/19 0400 12/07/19 0500 12/07/19 0600  BP: (!) 134/51 (!) 135/54 (!) 133/51 (!) 134/56  Pulse: (!) 55 (!) 54 (!) 54 (!) 58  Resp: 12 14 16 17   Temp: (!) 97.5 F (36.4 C) (!) 97.3 F (36.3 C) (!) 97.3 F (36.3 C) 97.9 F (36.6 C)  TempSrc:      SpO2: 97% 97% 95% 95%  Weight:   82.9 kg   Height:        Intake/Output Summary (Last 24 hours) at 12/07/2019 0816 Last data filed at 12/07/2019 0400 Gross per 24 hour  Intake 222.33 ml  Output 3125 ml  Net -2902.67 ml   Filed Weights   12/01/19 0500 12/02/19 0500 12/07/19 0500  Weight: 83 kg 83.4 kg 82.9 kg     Examination:  General exam: Appears calm and comfortable  Respiratory system: diminished breath sounds b/l Cardiovascular system: S1 & S2 +. No  rubs, gallops or clicks. B/l BKA Gastrointestinal system: Abdomen is nondistended, soft and nontender. Normal bowel sounds heard. Central nervous system: Alert and oriented to person only  Psychiatry: Judgement and insight appear abnormal. Mood & affect appropriate.     Data Reviewed: I have personally reviewed following labs and imaging studies  CBC: Recent Labs  Lab 12/01/19 0612 12/02/19 0415 12/03/19 0330 12/05/19 0430 12/06/19 0347  WBC 17.2* 11.8* 13.2* 10.8* 8.4  NEUTROABS 14.1* 8.8* 10.7* 8.1*  --   HGB 12.7* 12.3* 12.7* 11.0* 12.8*  HCT 36.9* 35.7* 37.9* 31.7* 37.2*  MCV 85.8 86.2 86.7 85.2 86.1  PLT 204 197 217 209 470   Basic Metabolic Panel: Recent Labs  Lab 12/02/19 0415 12/02/19 0415 12/03/19 0330 12/04/19 0444 12/05/19 0430 12/06/19 0347 12/07/19 0347  NA 139   < > 141 140 141 139 143  K 3.4*   < > 4.0 3.8 3.7 4.9 3.7  CL 105   < > 108 108 109 107 109  CO2 26   < > 28 24 24 23 24   GLUCOSE 125*   < > 140* 175* 120* 165* 120*  BUN 28*   < > 30* 29* 29* 34* 39*  CREATININE 1.56*   < > 1.62* 1.49* 1.30* 1.56* 1.59*  CALCIUM 8.6*   < > 8.4* 8.3* 7.9* 8.4* 8.4*  MG 1.9   < > 2.3 2.1 2.1 2.1 2.2  PHOS 2.9  --   --  3.4 2.8 4.9*  --    < > = values in this interval not displayed.   GFR: Estimated Creatinine Clearance: 36.9 mL/min (A) (by C-G formula based on SCr of 1.59 mg/dL (H)). Liver Function Tests: Recent Labs  Lab 12/01/19 0422 12/02/19 0415 12/03/19 0330  AST 26 24 23   ALT 23 24 21   ALKPHOS 81 84 90  BILITOT 0.9 1.0 1.2  PROT 5.6* 5.3* 5.6*  ALBUMIN 2.5* 2.6* 2.5*   No results for input(s): LIPASE, AMYLASE in the last 168 hours. No results for input(s): AMMONIA in the last 168 hours. Coagulation Profile: No results for input(s): INR, PROTIME in the last 168 hours. Cardiac Enzymes: No  results for input(s): CKTOTAL, CKMB, CKMBINDEX, TROPONINI in the last 168 hours. BNP (last 3 results) No results for input(s): PROBNP in the last 8760 hours. HbA1C: No results for input(s): HGBA1C in the last 72 hours. CBG: Recent Labs  Lab 12/06/19 1125 12/06/19 1538 12/06/19 1928 12/06/19 2324 12/07/19 0344  GLUCAP 186* 174* 129* 131* 120*   Lipid Profile: Recent Labs    12/06/19 0347  TRIG 140   Thyroid Function Tests: No results for input(s): TSH, T4TOTAL, FREET4, T3FREE, THYROIDAB in the last 72 hours. Anemia Panel: No results for input(s): VITAMINB12, FOLATE, FERRITIN, TIBC, IRON, RETICCTPCT in the last 72 hours. Sepsis Labs: Recent Labs  Lab 12/01/19 0422 12/02/19 0415 12/04/19 0444 12/05/19 0430  PROCALCITON 1.55 0.80 1.18 0.66    Recent Results (from the past 240 hour(s))  SARS Coronavirus 2 by RT PCR (hospital order, performed in [facility] hospital lab) Nasopharyngeal Nasopharyngeal Swab     Status: None   Collection Time: 11/30/19  2:54 AM   Specimen: Nasopharyngeal Swab  Result Value Ref Range Status   SARS Coronavirus 2 NEGATIVE NEGATIVE Final    Comment: (NOTE) SARS-CoV-2 target nucleic acids are NOT DETECTED.  The SARS-CoV-2 RNA is generally detectable in upper and lower respiratory specimens during the acute phase of infection. The lowest concentration of SARS-CoV-2 viral copies this assay can detect is 250 copies / mL. A negative result does not preclude SARS-CoV-2 infection and should not be used as the sole basis for treatment or other patient management decisions.  A negative result may occur with improper specimen collection / handling, submission of specimen other than nasopharyngeal swab, presence of viral mutation(s) within the areas targeted by this assay, and inadequate number of viral copies (<250 copies / mL). A negative result must be combined with clinical observations, patient history, and epidemiological information.  Fact  Sheet for Patients:   StrictlyIdeas.no  Fact Sheet for Healthcare Providers: BankingDealers.co.za  This test is not yet approved or  cleared by the [place] FDA and has been authorized for detection and/or diagnosis of SARS-CoV-2 by FDA under an Emergency Use Authorization (EUA).  This EUA will remain in effect (meaning this test can be used) for the duration of the COVID-19 declaration under Section 564(b)(1) of the Act, 21 U.S.C. section 360bbb-3(b)(1), unless the authorization is terminated or revoked sooner.  Performed at [facility], [address] [place], [place] [address]   MRSA PCR Screening     Status: None   Collection Time: 11/30/19  4:44 AM   Specimen: Nasopharyngeal  Result Value Ref Range Status   MRSA by PCR  NEGATIVE NEGATIVE Final    Comment:        The GeneXpert MRSA Assay (FDA approved for NASAL specimens only), is one component of a comprehensive MRSA colonization surveillance program. It is not intended to diagnose MRSA infection nor to guide or monitor treatment for MRSA infections. Performed at [facility], [address] [place], [place] [address]   CULTURE, BLOOD (ROUTINE X 2) w Reflex to ID Panel     Status: None   Collection Time: 11/30/19  5:33 AM   Specimen: Left Antecubital; Blood  Result Value Ref Range Status   Specimen Description LEFT ANTECUBITAL  Final   Special Requests   Final    BOTTLES DRAWN AEROBIC AND ANAEROBIC Blood Culture adequate volume   Culture   Final    NO GROWTH 5 DAYS Performed at [facility], [address] [place], [place] [address]    Report Status 12/05/2019 FINAL  Final  CULTURE, BLOOD (ROUTINE X 2) w Reflex to ID Panel     Status: None   Collection Time: 11/30/19  5:42 AM   Specimen: BLOOD LEFT FOREARM  Result Value Ref Range Status   Specimen Description BLOOD LEFT FOREARM  Final   Special Requests   Final    BOTTLES DRAWN  AEROBIC AND ANAEROBIC Blood Culture adequate volume   Culture   Final    NO GROWTH 5 DAYS Performed at [facility], [address] [place], [place] [address]    Report Status 12/05/2019 FINAL  Final  Culture, respiratory     Status: None   Collection Time: 11/30/19 11:18 AM   Specimen: Tracheal Aspirate; Respiratory  Result Value Ref Range Status   Specimen Description   Final    TRACHEAL ASPIRATE Performed at [facility] [address] [place], [place] [address]    Special Requests   Final    NONE Performed at [facility], [address] [place], [place] [address]    Gram Stain   Final    FEW WBC PRESENT, PREDOMINANTLY PMN FEW GRAM POSITIVE COCCI RARE YEAST    Culture   Final    FEW Consistent with normal respiratory flora. Performed at [facility], [place] [address], [place],  [address]    Report Status 12/02/2019 FINAL  Final         Radiology Studies: No results found.      Scheduled Meds: . Chlorhexidine Gluconate Cloth  6 each Topical Daily  . docusate  100 mg Oral BID  . insulin aspart  0-15 Units Subcutaneous Q4H  . insulin glargine  12 Units Subcutaneous QHS  . mouth rinse  15 mL Mouth Rinse BID  . polyethylene glycol  17 g Oral Daily  . rivaroxaban  15 mg Oral Q supper   Continuous Infusions: . famotidine (PEPCID) IV 20 mg (12/06/19 2049)  . levETIRAcetam 500 mg (12/06/19 1932)     LOS: 7 days    Time spent: 32 mins    Wyvonnia Dusky, MD [facility] Pager [phone]  If 7PM-7AM, please contact night-coverage [email or web address] 12/07/2019, 8:16 AM

## 2019-12-07 NOTE — Consult Note (Signed)
[place] for Electrolyte Monitoring and Replacement   Recent Labs: Potassium (mmol/L)  Date Value  12/07/2019 3.7  01/20/2013 3.5   Magnesium (mg/dL)  Date Value  12/07/2019 2.2   Calcium (mg/dL)  Date Value  12/07/2019 8.4 (L)   Calcium, Total (mg/dL)  Date Value  01/20/2013 9.4   Albumin (g/dL)  Date Value  12/03/2019 2.5 (L)  01/19/2013 3.7   Phosphorus (mg/dL)  Date Value  12/06/2019 4.9 (H)   Sodium (mmol/L)  Date Value  12/07/2019 143  01/20/2013 140   Corrected Ca: 9.6 mg/dL  Assessment:85 year old male with a past medical history significant for atrial fibrillation on Xarelto, HFpEF, CKD, diabetes mellitus, bilateral BKA's, PVD, sleep apnea, and COVID-19 infection in September 2020 who presents to [facility] ED on 11/30/2019 due to a seizure.  Patient extubated yesterday. NG tube out. Tube feeds stopped. Started on nectar thick diet.   Goal of Therapy:  Potassium 4.0 - 5.1 mmol/L Magnesium 2.0 - 2.4 mg/dL All Other Electrolytes WNL  Plan:   No electrolyte repletion indicated today  Re-check BMP in AM 7/17  Benita Gutter 12/07/2019 8:37 AM

## 2019-12-08 LAB — BASIC METABOLIC PANEL
Anion gap: 10 (ref 5–15)
BUN: 38 mg/dL — ABNORMAL HIGH (ref 8–23)
CO2: 25 mmol/L (ref 22–32)
Calcium: 8.3 mg/dL — ABNORMAL LOW (ref 8.9–10.3)
Chloride: 106 mmol/L (ref 98–111)
Creatinine, Ser: 1.56 mg/dL — ABNORMAL HIGH (ref 0.61–1.24)
GFR calc Af Amer: 47 mL/min — ABNORMAL LOW (ref >60)
GFR calc non Af Amer: 41 mL/min — ABNORMAL LOW (ref >60)
Glucose, Bld: 104 mg/dL — ABNORMAL HIGH (ref 70–99)
Potassium: 3.5 mmol/L (ref 3.5–5.1)
Sodium: 141 mmol/L (ref 135–145)

## 2019-12-08 LAB — GLUCOSE, CAPILLARY
Glucose-Capillary: 107 mg/dL — ABNORMAL HIGH (ref 70–99)
Glucose-Capillary: 142 mg/dL — ABNORMAL HIGH (ref 70–99)
Glucose-Capillary: 165 mg/dL — ABNORMAL HIGH (ref 70–99)
Glucose-Capillary: 266 mg/dL — ABNORMAL HIGH (ref 70–99)
Glucose-Capillary: 92 mg/dL (ref 70–99)
Glucose-Capillary: 95 mg/dL (ref 70–99)

## 2019-12-08 LAB — CBC
HCT: 34.7 % — ABNORMAL LOW (ref 39.0–52.0)
Hemoglobin: 11.9 g/dL — ABNORMAL LOW (ref 13.0–17.0)
MCH: 29.8 pg (ref 26.0–34.0)
MCHC: 34.3 g/dL (ref 30.0–36.0)
MCV: 86.8 fL (ref 80.0–100.0)
Platelets: 281 10*3/uL (ref 150–400)
RBC: 4 MIL/uL — ABNORMAL LOW (ref 4.22–5.81)
RDW: 14.3 % (ref 11.5–15.5)
WBC: 8.4 10*3/uL (ref 4.0–10.5)
nRBC: 0 % (ref 0.0–0.2)

## 2019-12-08 NOTE — Progress Notes (Signed)
SLP Cancellation Note  Patient Details Name: Fumio Vandam MRN: [id number] DOB: 02/22/1935   Cancelled treatment:       Reason Eval/Treat Not Completed:  (chart reviewed; consulted NSG). Per NSG, pt is tolerating the dysphagia diet well. NSG witnessed pt drinking bottled water (thin) w/ family in room and coughing. She gave education on pt's diet consistency and aspiration precautions and removed the water.  Recommend continue w/ current diet at this time w/ ST services to f/u on Monday w/ assessment of potential diet upgrade. NSG agreed.     Orinda Kenner, MS, CCC-SLP Jakhai Fant 12/08/2019, 3:43 PM

## 2019-12-08 NOTE — Progress Notes (Addendum)
°   12/08/19 1300  Clinical Encounter Type  Visited With Patient;Family  Visit Type Follow-up  Referral From Chaplain  Consult/Referral To Chaplain  Chaplain stopped by earlier to see patient, but he was sleeping. When patient arrived the second time patient was sitting up and talked to her. Patient told chaplain that he wanted to talk to his wife. As patient was dialing his wife, she and their son walked in the room. Patient and chaplain commented on how patient is doing. Wife said there is a difference even from last night. Chaplain told them if they need any thing to have her paged.

## 2019-12-08 NOTE — Consult Note (Signed)
[place] for Electrolyte Monitoring and Replacement   Recent Labs: Potassium (mmol/L)  Date Value  12/08/2019 3.5  01/20/2013 3.5   Magnesium (mg/dL)  Date Value  12/07/2019 2.2   Calcium (mg/dL)  Date Value  12/08/2019 8.3 (L)   Calcium, Total (mg/dL)  Date Value  01/20/2013 9.4   Albumin (g/dL)  Date Value  12/03/2019 2.5 (L)  01/19/2013 3.7   Phosphorus (mg/dL)  Date Value  12/06/2019 4.9 (H)   Sodium (mmol/L)  Date Value  12/08/2019 141  01/20/2013 140   Corrected Ca: 9.6 mg/dL  Assessment:85 year old male with a past medical history significant for atrial fibrillation on Xarelto, HFpEF, CKD, diabetes mellitus, bilateral BKA's, PVD, sleep apnea, and COVID-19 infection in September 2020 who presents to [facility] ED on 11/30/2019 due to a seizure.  Patient extubated 7/15. NG tube out. Tube feeds stopped. Started on nectar thick diet.   Goal of Therapy:  Potassium 4.0 - 5.1 mmol/L Magnesium 2.0 - 2.4 mg/dL All Other Electrolytes WNL  Plan:  No further electrolyte indicated. Will discontinue consult.  Tawnya Crook, PharmD 12/08/2019 7:12 AM

## 2019-12-08 NOTE — Progress Notes (Signed)
Pt is stating he had two rings on at admission that are missing. Pt was admitted to ICU and the admissions profile was not completed. I have not seen any rings in the room or on the patient since assuming care on Friday night 12/07/19 at 1900.

## 2019-12-08 NOTE — Evaluation (Signed)
Occupational Therapy Evaluation Patient Details Name: [name] MRN: [id number] DOB: January 13, 1935 Today's Date: 12/08/2019    History of Present Illness [name] is a 85 year old male with a past medical history significant for seizures, atrial fibrillation on Xarelto, HFpEF, CKD, diabetes mellitus, bilateral BKA's, PVD, sleep apnea, and COVID-19 infection in September 2020 who presents to [facility] ED on 11/30/2019 due to syncope and hyperglycemia. Chest CT 11/30/19: Posterior left third through sixth rib fractures. Posterior right rib 4 rib fractures. Extubated 12/06/19.    Clinical Impression   [name] was seen for OT evaluation this date. Prior to hospital admission, pt was using LLE prosthesis + RW + MIN A for w/c t/fs. Pt lives c wife and son available as needed. Pt is oriented to self and place only. Pt presents to acute OT demonstrating impaired ADL performance, functional cognition, and functional mobility 2/2 decreased command following, functional strength/ROM/balance deficits, and decreased activity tolerance. Pt currently requires MOD A for L+R rolling at bed level and sup<>sit at EOB - pt able to use rails to elevate trunk and scoot forward, assist to come to EOB and maintain balance. Pt requires SETUP self-feeding/drinking at bed level. Anticipate MAX A for w/c t/fs and pericare at bedlevel. Pt would benefit from skilled OT to address noted impairments and functional limitations (see below for any additional details) in order to maximize safety and independence while minimizing falls risk and caregiver burden. Upon hospital discharge, recommend STR to maximize pt safety and return to PLOF.     Follow Up Recommendations  SNF    Equipment Recommendations   (TBD)    Recommendations for Other Services       Precautions / Restrictions Precautions Precautions: Fall (seizure, aspiration ) Restrictions Weight Bearing Restrictions: No      Mobility Bed Mobility Overal  bed mobility: Needs Assistance Bed Mobility: Supine to Sit;Sit to Supine;Rolling Rolling: Mod assist   Supine to sit: HOB elevated;Mod assist Sit to supine: Mod assist   General bed mobility comments: Pt able to use rails to elevate trunk and scoot forward, assist to come to EOB and maintain balance   Transfers                 General transfer comment: Not tested 2/2 b/l BKA     Balance Overall balance assessment: Needs assistance Sitting-balance support: Bilateral upper extremity supported;Feet unsupported Sitting balance-Leahy Scale: Fair Sitting balance - Comments: Intermittently able to maintain balance w/o assist                                    ADL either performed or assessed with clinical judgement   ADL Overall ADL's : Needs assistance/impaired                                       General ADL Comments: SETUP self-feeding/drinking at bed level. Anticipate MAX A for ADL t/fs and pericare at bedlevel.      Vision         Perception     Praxis      Pertinent Vitals/Pain Pain Assessment: No/denies pain     Hand Dominance Right   Extremity/Trunk Assessment Upper Extremity Assessment Upper Extremity Assessment: RUE deficits/detail;LUE deficits/detail RUE Deficits / Details: Grip 4/5. Shoulder flexion AROM ~85*. Elbow flexion WFL LUE Deficits / Details: Grip  3/5. Shoulder flexion AROM ~85*. Elbow flexion [facility]   Lower Extremity Assessment Lower Extremity Assessment: Generalized weakness       Communication Communication Communication: HOH   Cognition Arousal/Alertness: Awake/alert (Per RN pt has been lethargic all morning, now more alert) Behavior During Therapy: WFL for tasks assessed/performed Overall Cognitive Status: Impaired/Different from baseline Area of Impairment: Memory;Orientation;Attention;Following commands;Problem solving                 Orientation Level: Disoriented to;Time;Situation    Memory: Decreased recall of precautions;Decreased short-term memory Following Commands: Follows one step commands with increased time     Problem Solving: Slow processing;Decreased initiation;Difficulty sequencing;Requires verbal cues;Requires tactile cues     General Comments       Exercises Exercises: Other exercises Other Exercises Other Exercises: Pt and family educated re: OT role, DME recs, d/c recs, ECS, falls prevention Other Exercises: Grooming, self-drinking, sup<>sit, sitting balance/tolerance, rolling bed level    Shoulder Instructions      Home Living Family/patient expects to be discharged to:: Private residence Living Arrangements: Spouse/significant other Available Help at Discharge: Family Type of Home: House Home Access: Ramped entrance     Home Layout: One level     Bathroom Shower/Tub: Occupational psychologist: Standard Bathroom Accessibility: Yes   Home Equipment: Cane - single point;Walker - 2 wheels;Grab bars - toilet;Wheelchair - Biomedical scientist Comments: Son available PRN       Prior Functioning/Environment Level of Independence: Needs assistance  Gait / Transfers Assistance Needed: LLE prosthetic + MIN A for SPT bed <>w/c. Pt's L prosthesis requiring new adjustments and RLE as yet has not had prosthesis made.              OT Problem List: Decreased strength;Decreased range of motion;Decreased activity tolerance;Impaired balance (sitting and/or standing);Decreased safety awareness      OT Treatment/Interventions: Self-care/ADL training;Therapeutic exercise;Energy conservation;DME and/or AE instruction;Therapeutic activities;Patient/family education;Balance training;Cognitive remediation/compensation    OT Goals(Current goals can be found in the care plan section) Acute Rehab OT Goals Patient Stated Goal: To return home OT Goal Formulation: With patient/family Time For Goal Achievement: 12/22/19 Potential to  Achieve Goals: Fair ADL Goals Pt Will Perform Grooming: with set-up;with supervision;sitting Pt Will Perform Lower Body Dressing: with set-up;with min assist;sitting/lateral leans Pt Will Transfer to Toilet: with supervision (rolling at bed level)  OT Frequency: Min 2X/week   Barriers to D/C: Inaccessible home environment          Co-evaluation              AM-PAC OT "6 Clicks" Daily Activity     Outcome Measure Help from another person eating meals?: A Little Help from another person taking care of personal grooming?: A Little Help from another person toileting, which includes using toliet, bedpan, or urinal?: A Lot Help from another person bathing (including washing, rinsing, drying)?: A Lot Help from another person to put on and taking off regular upper body clothing?: A Little Help from another person to put on and taking off regular lower body clothing?: A Lot 6 Click Score: 15   End of Session Nurse Communication: Mobility status  Activity Tolerance: Patient tolerated treatment well Patient left: in bed;with call bell/phone within reach;with bed alarm set;with family/visitor present  OT Visit Diagnosis: Unsteadiness on feet (R26.81);Other abnormalities of gait and mobility (R26.89);Muscle weakness (generalized) (M62.81)                Time: 5462-7035 OT Time Calculation (  min): 31 min Charges:  OT General Charges $OT Visit: 1 Visit OT Evaluation $OT Eval Moderate Complexity: 1 Mod OT Treatments $Self Care/Home Management : 8-22 mins $Therapeutic Activity: 8-22 mins  Dessie Coma, M.S. OTR/L  12/08/19, 3:44 PM

## 2019-12-08 NOTE — Progress Notes (Signed)
This nurse just received a message from patient's daughter that patient's son [name] has the rings. Patient on phone with son now

## 2019-12-08 NOTE — Progress Notes (Signed)
PROGRESS NOTE    [name]  ZOX:[id number] DOB: 1934/07/18 DOA: 11/30/2019 PCP: Corey Skains, MD    Assessment & Plan:   Active Problems:   Seizures ([place])   Goals of care, counseling/discussion   Palliative care by specialist   Acute hypoxic & hypercapnic respiratory failure: secondary to aspiration pneumonia, sepsis & seizures. Extubated 12/06/19 as per ICU. Continue on supplemental oxygen and wean as tolerated  Generalized weakness: PT/OT consulted. Will likely need SNF   Aspiration pneumonia: completed course of abxs. Continue on supplemental oxygen and wean as tolerated. Encourage incentive spirometry   DM2: poorly controlled. Hx of b/l BKA. Continue on lantus & SSI   Acute diastolic CHF exacerbation: continue on IV lasix as tolerated. Continue on supplemental oxygen and wean as tolerated   Obesity: BMI 28.6. Would benefit from weight loss   Likely AKI on CKD: baseline Cr/GFR is unknown, currently stage IIIa. Cr is trending down slightly today. Will continue to monitor   Acute toxic metabolic encephalopathy: likely secondary to infection. Oriented to self only still. Hx of memory problems as per pt's wife. Unchanged from day prior   Seizures: unknown type. Continue on keppra   DVT prophylaxis: xarelto  Code Status: DNR Family Communication discussed pt's care w/ pt's wife, Everlene Farrier, and answered her questions Disposition Plan: depends on PT/OT recs  Status is: Inpatient  Remains inpatient appropriate because:Altered mental status and Unsafe d/c plan   Dispo: The patient is from: Home              Anticipated d/c is to: SNF              Anticipated d/c date is: 2 days              Patient currently is not medically stable to d/c.    Consultants:  ICU    Procedures: intubation, ventilation & extubation    Antimicrobials:    Subjective: Pt is oriented to person only. Pt is unable to answer questions appropriately   Objective: Vitals:    12/07/19 1619 12/07/19 1949 12/08/19 0115 12/08/19 0510  BP: (!) 160/68 (!) 164/64 (!) 157/74 (!) 173/69  Pulse: (!) 54 (!) 48 (!) 48 (!) 57  Resp: 16 17 16 16   Temp: 98.5 F (36.9 C) 98 F (36.7 C) (!) 97.5 F (36.4 C) 98.6 F (37 C)  TempSrc: Oral Oral Oral Oral  SpO2: 100% 97% 96% 95%  Weight:      Height:        Intake/Output Summary (Last 24 hours) at 12/08/2019 0715 Last data filed at 12/08/2019 0531 Gross per 24 hour  Intake 398.05 ml  Output 700 ml  Net -301.95 ml   Filed Weights   12/01/19 0500 12/02/19 0500 12/07/19 0500  Weight: 83 kg 83.4 kg 82.9 kg    Examination:  General exam: Appears calm and comfortable  Respiratory system: decreased breath sounds b/l. No wheezes Cardiovascular system: S1 & S2 +. No  rubs, gallops or clicks. B/l BKA Gastrointestinal system: Abdomen is nondistended, soft and nontender. Hypoactive bowel sounds heard. Central nervous system: Alert and oriented to person only  Psychiatry: Judgement and insight appear abnormal. Mood & affect appropriate.     Data Reviewed: I have personally reviewed following labs and imaging studies  CBC: Recent Labs  Lab 12/02/19 0415 12/03/19 0330 12/05/19 0430 12/06/19 0347 12/08/19 0327  WBC 11.8* 13.2* 10.8* 8.4 8.4  NEUTROABS 8.8* 10.7* 8.1*  --   --  HGB 12.3* 12.7* 11.0* 12.8* 11.9*  HCT 35.7* 37.9* 31.7* 37.2* 34.7*  MCV 86.2 86.7 85.2 86.1 86.8  PLT 197 217 209 278 932   Basic Metabolic Panel: Recent Labs  Lab 12/02/19 0415 12/02/19 0415 12/03/19 0330 12/03/19 0330 12/04/19 0444 12/05/19 0430 12/06/19 0347 12/07/19 0347 12/08/19 0327  NA 139   < > 141   < > 140 141 139 143 141  K 3.4*   < > 4.0   < > 3.8 3.7 4.9 3.7 3.5  CL 105   < > 108   < > 108 109 107 109 106  CO2 26   < > 28   < > 24 24 23 24 25   GLUCOSE 125*   < > 140*   < > 175* 120* 165* 120* 104*  BUN 28*   < > 30*   < > 29* 29* 34* 39* 38*  CREATININE 1.56*   < > 1.62*   < > 1.49* 1.30* 1.56* 1.59* 1.56*   CALCIUM 8.6*   < > 8.4*   < > 8.3* 7.9* 8.4* 8.4* 8.3*  MG 1.9   < > 2.3  --  2.1 2.1 2.1 2.2  --   PHOS 2.9  --   --   --  3.4 2.8 4.9*  --   --    < > = values in this interval not displayed.   GFR: Estimated Creatinine Clearance: 37.6 mL/min (A) (by C-G formula based on SCr of 1.56 mg/dL (H)). Liver Function Tests: Recent Labs  Lab 12/02/19 0415 12/03/19 0330  AST 24 23  ALT 24 21  ALKPHOS 84 90  BILITOT 1.0 1.2  PROT 5.3* 5.6*  ALBUMIN 2.6* 2.5*   No results for input(s): LIPASE, AMYLASE in the last 168 hours. No results for input(s): AMMONIA in the last 168 hours. Coagulation Profile: No results for input(s): INR, PROTIME in the last 168 hours. Cardiac Enzymes: No results for input(s): CKTOTAL, CKMB, CKMBINDEX, TROPONINI in the last 168 hours. BNP (last 3 results) No results for input(s): PROBNP in the last 8760 hours. HbA1C: No results for input(s): HGBA1C in the last 72 hours. CBG: Recent Labs  Lab 12/07/19 0858 12/07/19 1212 12/07/19 1950 12/08/19 0023 12/08/19 0511  GLUCAP 115* 143* 121* 107* 92   Lipid Profile: Recent Labs    12/06/19 0347  TRIG 140   Thyroid Function Tests: No results for input(s): TSH, T4TOTAL, FREET4, T3FREE, THYROIDAB in the last 72 hours. Anemia Panel: No results for input(s): VITAMINB12, FOLATE, FERRITIN, TIBC, IRON, RETICCTPCT in the last 72 hours. Sepsis Labs: Recent Labs  Lab 12/02/19 0415 12/04/19 0444 12/05/19 0430  PROCALCITON 0.80 1.18 0.66    Recent Results (from the past 240 hour(s))  SARS Coronavirus 2 by RT PCR (hospital order, performed in [facility] hospital lab) Nasopharyngeal Nasopharyngeal Swab     Status: None   Collection Time: 11/30/19  2:54 AM   Specimen: Nasopharyngeal Swab  Result Value Ref Range Status   SARS Coronavirus 2 NEGATIVE NEGATIVE Final    Comment: (NOTE) SARS-CoV-2 target nucleic acids are NOT DETECTED.  The SARS-CoV-2 RNA is generally detectable in upper and lower respiratory  specimens during the acute phase of infection. The lowest concentration of SARS-CoV-2 viral copies this assay can detect is 250 copies / mL. A negative result does not preclude SARS-CoV-2 infection and should not be used as the sole basis for treatment or other patient management decisions.  A negative result may occur with  improper specimen collection / handling, submission of specimen other than nasopharyngeal swab, presence of viral mutation(s) within the areas targeted by this assay, and inadequate number of viral copies (<250 copies / mL). A negative result must be combined with clinical observations, patient history, and epidemiological information.  Fact Sheet for Patients:   StrictlyIdeas.no  Fact Sheet for Healthcare Providers: BankingDealers.co.za  This test is not yet approved or  cleared by the [place] FDA and has been authorized for detection and/or diagnosis of SARS-CoV-2 by FDA under an Emergency Use Authorization (EUA).  This EUA will remain in effect (meaning this test can be used) for the duration of the COVID-19 declaration under Section 564(b)(1) of the Act, 21 U.S.C. section 360bbb-3(b)(1), unless the authorization is terminated or revoked sooner.  Performed at [facility], [address] [place], [place] [address]   MRSA PCR Screening     Status: None   Collection Time: 11/30/19  4:44 AM   Specimen: Nasopharyngeal  Result Value Ref Range Status   MRSA by PCR NEGATIVE NEGATIVE Final    Comment:        The GeneXpert MRSA Assay (FDA approved for NASAL specimens only), is one component of a comprehensive MRSA colonization surveillance program. It is not intended to diagnose MRSA infection nor to guide or monitor treatment for MRSA infections. Performed at [facility], [address] [place], [place] [address]   CULTURE, BLOOD (ROUTINE X 2) w Reflex to ID Panel     Status: None    Collection Time: 11/30/19  5:33 AM   Specimen: Left Antecubital; Blood  Result Value Ref Range Status   Specimen Description LEFT ANTECUBITAL  Final   Special Requests   Final    BOTTLES DRAWN AEROBIC AND ANAEROBIC Blood Culture adequate volume   Culture   Final    NO GROWTH 5 DAYS Performed at [facility], [address] [place], [place] [address]    Report Status 12/05/2019 FINAL  Final  CULTURE, BLOOD (ROUTINE X 2) w Reflex to ID Panel     Status: None   Collection Time: 11/30/19  5:42 AM   Specimen: BLOOD LEFT FOREARM  Result Value Ref Range Status   Specimen Description BLOOD LEFT FOREARM  Final   Special Requests   Final    BOTTLES DRAWN AEROBIC AND ANAEROBIC Blood Culture adequate volume   Culture   Final    NO GROWTH 5 DAYS Performed at [facility], [address] [place], [place] [address]    Report Status 12/05/2019 FINAL  Final  Culture, respiratory     Status: None   Collection Time: 11/30/19 11:18 AM   Specimen: Tracheal Aspirate; Respiratory  Result Value Ref Range Status   Specimen Description   Final    TRACHEAL ASPIRATE Performed at [facility], [address] [place], [place] [address]    Special Requests   Final    NONE Performed at [facility], [address] [place], [place] [address]    Gram Stain   Final    FEW WBC PRESENT, PREDOMINANTLY PMN FEW GRAM POSITIVE COCCI RARE YEAST    Culture   Final    FEW Consistent with normal respiratory flora. Performed at [facility], [place] [address] [place], [place] [address]    Report Status 12/02/2019 FINAL  Final         Radiology Studies: No results found.      Scheduled Meds: . Chlorhexidine  Gluconate Cloth  6 each Topical Daily  . docusate  100 mg Oral BID  . feeding supplement (NEPRO CARB STEADY)  237 mL Oral BID BM  . insulin aspart  0-15 Units Subcutaneous Q4H  . insulin glargine  12 Units Subcutaneous QHS  . mouth rinse  15 mL Mouth  Rinse BID  . multivitamin with minerals  1 tablet Oral Daily  . polyethylene glycol  17 g Oral Daily  . rivaroxaban  15 mg Oral Q supper   Continuous Infusions: . sodium chloride 250 mL (12/07/19 2315)  . famotidine (PEPCID) IV 20 mg (12/07/19 2319)  . levETIRAcetam Stopped (12/07/19 2305)     LOS: 8 days    Time spent: 30 mins    Wyvonnia Dusky, MD [facility] Pager [phone]  If 7PM-7AM, please contact night-coverage [email or web address] 12/08/2019, 7:15 AM

## 2019-12-09 DIAGNOSIS — E876 Hypokalemia: Secondary | ICD-10-CM

## 2019-12-09 LAB — BASIC METABOLIC PANEL
Anion gap: 5 (ref 5–15)
BUN: 27 mg/dL — ABNORMAL HIGH (ref 8–23)
CO2: 28 mmol/L (ref 22–32)
Calcium: 8.1 mg/dL — ABNORMAL LOW (ref 8.9–10.3)
Chloride: 107 mmol/L (ref 98–111)
Creatinine, Ser: 1.26 mg/dL — ABNORMAL HIGH (ref 0.61–1.24)
GFR calc Af Amer: >60 mL/min (ref >60)
GFR calc non Af Amer: 53 mL/min — ABNORMAL LOW (ref >60)
Glucose, Bld: 104 mg/dL — ABNORMAL HIGH (ref 70–99)
Potassium: 3.3 mmol/L — ABNORMAL LOW (ref 3.5–5.1)
Sodium: 140 mmol/L (ref 135–145)

## 2019-12-09 LAB — CBC
HCT: 36.9 % — ABNORMAL LOW (ref 39.0–52.0)
Hemoglobin: 12.1 g/dL — ABNORMAL LOW (ref 13.0–17.0)
MCH: 29.2 pg (ref 26.0–34.0)
MCHC: 32.8 g/dL (ref 30.0–36.0)
MCV: 89.1 fL (ref 80.0–100.0)
Platelets: 300 10*3/uL (ref 150–400)
RBC: 4.14 MIL/uL — ABNORMAL LOW (ref 4.22–5.81)
RDW: 13.8 % (ref 11.5–15.5)
WBC: 10.8 10*3/uL — ABNORMAL HIGH (ref 4.0–10.5)
nRBC: 0 % (ref 0.0–0.2)

## 2019-12-09 LAB — GLUCOSE, CAPILLARY
Glucose-Capillary: 100 mg/dL — ABNORMAL HIGH (ref 70–99)
Glucose-Capillary: 103 mg/dL — ABNORMAL HIGH (ref 70–99)
Glucose-Capillary: 103 mg/dL — ABNORMAL HIGH (ref 70–99)
Glucose-Capillary: 196 mg/dL — ABNORMAL HIGH (ref 70–99)
Glucose-Capillary: 222 mg/dL — ABNORMAL HIGH (ref 70–99)
Glucose-Capillary: 223 mg/dL — ABNORMAL HIGH (ref 70–99)

## 2019-12-09 MED ORDER — POTASSIUM CHLORIDE CRYS ER 20 MEQ PO TBCR
40.0000 meq | EXTENDED_RELEASE_TABLET | Freq: Once | ORAL | Status: AC
  Administered 2019-12-09: 09:00:00 40 meq via ORAL
  Filled 2019-12-09: qty 2

## 2019-12-09 NOTE — Progress Notes (Signed)
PROGRESS NOTE    [name]  HYQ:[id number] DOB: 20-Nov-1934 DOA: 11/30/2019 PCP: Corey Skains, MD    Assessment & Plan:   Active Problems:   Seizures ([place])   Goals of care, counseling/discussion   Palliative care by specialist   Acute hypoxic & hypercapnic respiratory failure: secondary to aspiration pneumonia, sepsis & seizures. Extubated 12/06/19 as per ICU. Continue on supplemental oxygen and wean as tolerated  Generalized weakness: OT recs SNF. PT consulted. Will likely need SNF   Aspiration pneumonia: completed course of abxs. Continue on supplemental oxygen and wean as tolerated. Encourage incentive spirometry   DM2: poorly controlled. Hx of b/l BKA. Continue on lantus & SSI   Acute diastolic CHF exacerbation: continue on IV lasix as tolerated. Continue on supplemental oxygen and wean as tolerated   Obesity: BMI 28.6. Would benefit from weight loss   Likely AKI on CKD: baseline Cr/GFR is unknown, currently stage IIIa. Cr continues to trend down. Will continue to monitor   Hypokalemia: KCl repleted. Will continue to monitor   Leukocytosis: likely reactive. Will continue to monitor   Acute toxic metabolic encephalopathy: likely secondary to infection. Improved today. Hx of memory problems as per pt's wife.  Seizures: unknown type. Continue on keppra   DVT prophylaxis: xarelto  Code Status: DNR Family Communication  Disposition Plan: likely d/c to SNF  Status is: Inpatient  Remains inpatient appropriate because:Altered mental status and Unsafe d/c plan   Dispo: The patient is from: Home              Anticipated d/c is to: SNF              Anticipated d/c date is: 2 days              Patient currently is not medically stable to d/c.    Consultants:  ICU    Procedures: intubation, ventilation & extubation    Antimicrobials:    Subjective: Pt c/o fatigue  Objective: Vitals:   12/08/19 1227 12/08/19 1631 12/08/19 2013 12/09/19 0026   BP: (!) 183/93 (!) 175/71 (!) 178/67 (!) 176/97  Pulse: (!) 53 (!) 51 79 67  Resp: 14 16 16 16   Temp: 98.7 F (37.1 C) 98.6 F (37 C) 98 F (36.7 C) 98.1 F (36.7 C)  TempSrc:   Oral Oral  SpO2: 96% 95% 100% 98%  Weight:      Height:        Intake/Output Summary (Last 24 hours) at 12/09/2019 0728 Last data filed at 12/08/2019 1254 Gross per 24 hour  Intake 179.99 ml  Output --  Net 179.99 ml   Filed Weights   12/01/19 0500 12/02/19 0500 12/07/19 0500  Weight: 83 kg 83.4 kg 82.9 kg    Examination:  General exam: Appears calm and comfortable  Respiratory system: diminished breath sounds b/l. No rales Cardiovascular system: S1 & S2 +. No  rubs, gallops or clicks. B/l BKA Gastrointestinal system: Abdomen is nondistended, soft and nontender. Normal bowel sounds heard. Central nervous system: Alert and oriented to person only  Psychiatry: Judgement and insight appear abnormal. Mood & affect appropriate.     Data Reviewed: I have personally reviewed following labs and imaging studies  CBC: Recent Labs  Lab 12/03/19 0330 12/05/19 0430 12/06/19 0347 12/08/19 0327 12/09/19 0343  WBC 13.2* 10.8* 8.4 8.4 10.8*  NEUTROABS 10.7* 8.1*  --   --   --   HGB 12.7* 11.0* 12.8* 11.9* 12.1*  HCT 37.9* 31.7* 37.2*  34.7* 36.9*  MCV 86.7 85.2 86.1 86.8 89.1  PLT 217 209 278 281 701   Basic Metabolic Panel: Recent Labs  Lab 12/03/19 0330 12/03/19 0330 12/04/19 0444 12/04/19 0444 12/05/19 0430 12/06/19 0347 12/07/19 0347 12/08/19 0327 12/09/19 0343  NA 141   < > 140   < > 141 139 143 141 140  K 4.0   < > 3.8   < > 3.7 4.9 3.7 3.5 3.3*  CL 108   < > 108   < > 109 107 109 106 107  CO2 28   < > 24   < > 24 23 24 25 28   GLUCOSE 140*   < > 175*   < > 120* 165* 120* 104* 104*  BUN 30*   < > 29*   < > 29* 34* 39* 38* 27*  CREATININE 1.62*   < > 1.49*   < > 1.30* 1.56* 1.59* 1.56* 1.26*  CALCIUM 8.4*   < > 8.3*   < > 7.9* 8.4* 8.4* 8.3* 8.1*  MG 2.3  --  2.1  --  2.1 2.1 2.2   --   --   PHOS  --   --  3.4  --  2.8 4.9*  --   --   --    < > = values in this interval not displayed.   GFR: Estimated Creatinine Clearance: 46.5 mL/min (A) (by C-G formula based on SCr of 1.26 mg/dL (H)). Liver Function Tests: Recent Labs  Lab 12/03/19 0330  AST 23  ALT 21  ALKPHOS 90  BILITOT 1.2  PROT 5.6*  ALBUMIN 2.5*   No results for input(s): LIPASE, AMYLASE in the last 168 hours. No results for input(s): AMMONIA in the last 168 hours. Coagulation Profile: No results for input(s): INR, PROTIME in the last 168 hours. Cardiac Enzymes: No results for input(s): CKTOTAL, CKMB, CKMBINDEX, TROPONINI in the last 168 hours. BNP (last 3 results) No results for input(s): PROBNP in the last 8760 hours. HbA1C: No results for input(s): HGBA1C in the last 72 hours. CBG: Recent Labs  Lab 12/08/19 1224 12/08/19 1629 12/08/19 2010 12/09/19 0018 12/09/19 0422  GLUCAP 95 266* 165* 100* 103*   Lipid Profile: No results for input(s): CHOL, HDL, LDLCALC, TRIG, CHOLHDL, LDLDIRECT in the last 72 hours. Thyroid Function Tests: No results for input(s): TSH, T4TOTAL, FREET4, T3FREE, THYROIDAB in the last 72 hours. Anemia Panel: No results for input(s): VITAMINB12, FOLATE, FERRITIN, TIBC, IRON, RETICCTPCT in the last 72 hours. Sepsis Labs: Recent Labs  Lab 12/04/19 0444 12/05/19 0430  PROCALCITON 1.18 0.66    Recent Results (from the past 240 hour(s))  SARS Coronavirus 2 by RT PCR (hospital order, performed in [facility] hospital lab) Nasopharyngeal Nasopharyngeal Swab     Status: None   Collection Time: 11/30/19  2:54 AM   Specimen: Nasopharyngeal Swab  Result Value Ref Range Status   SARS Coronavirus 2 NEGATIVE NEGATIVE Final    Comment: (NOTE) SARS-CoV-2 target nucleic acids are NOT DETECTED.  The SARS-CoV-2 RNA is generally detectable in upper and lower respiratory specimens during the acute phase of infection. The lowest concentration of SARS-CoV-2 viral copies this  assay can detect is 250 copies / mL. A negative result does not preclude SARS-CoV-2 infection and should not be used as the sole basis for treatment or other patient management decisions.  A negative result may occur with improper specimen collection / handling, submission of specimen other than nasopharyngeal swab, presence of viral mutation(s)  within the areas targeted by this assay, and inadequate number of viral copies (<250 copies / mL). A negative result must be combined with clinical observations, patient history, and epidemiological information.  Fact Sheet for Patients:   StrictlyIdeas.no  Fact Sheet for Healthcare Providers: BankingDealers.co.za  This test is not yet approved or  cleared by the [place] FDA and has been authorized for detection and/or diagnosis of SARS-CoV-2 by FDA under an Emergency Use Authorization (EUA).  This EUA will remain in effect (meaning this test can be used) for the duration of the COVID-19 declaration under Section 564(b)(1) of the Act, 21 U.S.C. section 360bbb-3(b)(1), unless the authorization is terminated or revoked sooner.  Performed at [facility], [address] [place], [place] [address]   MRSA PCR Screening     Status: None   Collection Time: 11/30/19  4:44 AM   Specimen: Nasopharyngeal  Result Value Ref Range Status   MRSA by PCR NEGATIVE NEGATIVE Final    Comment:        The GeneXpert MRSA Assay (FDA approved for NASAL specimens only), is one component of a comprehensive MRSA colonization surveillance program. It is not intended to diagnose MRSA infection nor to guide or monitor treatment for MRSA infections. Performed at [facility], [address] [place], [place] [address]   CULTURE, BLOOD (ROUTINE X 2) w Reflex to ID Panel     Status: None   Collection Time: 11/30/19  5:33 AM   Specimen: Left Antecubital; Blood  Result Value Ref Range Status    Specimen Description LEFT ANTECUBITAL  Final   Special Requests   Final    BOTTLES DRAWN AEROBIC AND ANAEROBIC Blood Culture adequate volume   Culture   Final    NO GROWTH 5 DAYS Performed at [facility], [address] [place], [place] [address]    Report Status 12/05/2019 FINAL  Final  CULTURE, BLOOD (ROUTINE X 2) w Reflex to ID Panel     Status: None   Collection Time: 11/30/19  5:42 AM   Specimen: BLOOD LEFT FOREARM  Result Value Ref Range Status   Specimen Description BLOOD LEFT FOREARM  Final   Special Requests   Final    BOTTLES DRAWN AEROBIC AND ANAEROBIC Blood Culture adequate volume   Culture   Final    NO GROWTH 5 DAYS Performed at [facility], [address], [place], [place] [address]    Report Status 12/05/2019 FINAL  Final  Culture, respiratory     Status: None   Collection Time: 11/30/19 11:18 AM   Specimen: Tracheal Aspirate; Respiratory  Result Value Ref Range Status   Specimen Description   Final    TRACHEAL ASPIRATE Performed at [facility], [address] [place], [place] [address]    Special Requests   Final    NONE Performed at [facility], [address] [place], [place] [address]    Gram Stain   Final    FEW WBC PRESENT, PREDOMINANTLY PMN FEW GRAM POSITIVE COCCI RARE YEAST    Culture   Final    FEW Consistent with normal respiratory flora. Performed at [facility], [place] [address], [place], [place] [address]    Report Status 12/02/2019 FINAL  Final         Radiology Studies: No results found.      Scheduled Meds: . docusate  100 mg Oral BID  . feeding supplement (NEPRO CARB STEADY)  237 mL Oral  BID BM  . insulin aspart  0-15 Units Subcutaneous Q4H  . insulin glargine  12 Units Subcutaneous QHS  . mouth rinse  15 mL Mouth Rinse BID  . multivitamin with minerals  1 tablet Oral Daily  . polyethylene glycol  17 g Oral Daily  . potassium chloride  40 mEq Oral Once  . rivaroxaban   15 mg Oral Q supper   Continuous Infusions: . sodium chloride Stopped (12/09/19 0442)  . famotidine (PEPCID) IV Stopped (12/09/19 0636)  . levETIRAcetam Stopped (12/09/19 0442)     LOS: 9 days    Time spent: 31 mins    Wyvonnia Dusky, MD [facility] Pager [phone]  If 7PM-7AM, please contact night-coverage [email or web address] 12/09/2019, 7:28 AM

## 2019-12-09 NOTE — Plan of Care (Signed)

## 2019-12-09 NOTE — Evaluation (Signed)
Physical Therapy Evaluation Patient Details Name: [name] MRN: [id number] DOB: Jan 04, 1935 Today's Date: 12/09/2019   History of Present Illness  [name] is a 85 year old male with a past medical history significant for seizures, atrial fibrillation on Xarelto, HFpEF, CKD, diabetes mellitus, bilateral BKA's, PVD, sleep apnea, and COVID-19 infection in September 2020 who presents to [facility] ED on 11/30/2019 due to syncope and hyperglycemia. Chest CT 11/30/19: Posterior left third through sixth rib fractures. Posterior right rib 4 rib fractures. Extubated 12/06/19.   Clinical Impression  Patient is more oriented this PT session, and is motivated to participate. Patient continues to need increased time and cuing for 1-step commands, but does respond appropriately following multimodal cuing and increased time. Patient continues to require modA for rolling, and supine <> sit with HOB elevated and use of bedrails, though he is able to participate in transfer, following cuing. Patient demonstrates inability to sit erect with heavy kyphosis and increased cervical flexion with chin resting on sternum. Patient is unable to correct this despite cuing, but following 47mins of EOB sitting and scooting into position is able to maintain sitting supervision for a couple minutes at a time. Is able to complete across body reaching and maintain sitting balance for a couple trials, with quick fatigue. Pt does not demonstrate strength or safety following commands to attempt slide-board transfer at this time, though that will most-likely be his most ind means of transferring in the near future (pt reports he has one prosthetic LE and is working on getting another to transfer to his WC). Pt demonstrates good carry over of TA activation in bed to continue core strengthening needed for basic transfers. Would benefit from skilled PT to address above deficits and promote optimal return to PLOF.     Follow Up  Recommendations SNF    Equipment Recommendations       Recommendations for Other Services       Precautions / Restrictions Precautions Precautions: Fall      Mobility  Bed Mobility Overal bed mobility: Needs Assistance Bed Mobility: Supine to Sit;Sit to Supine;Rolling Rolling: Mod assist   Supine to sit: HOB elevated;Mod assist Sit to supine: Mod assist   General bed mobility comments: Pt able to comply with cuing to utlize rails and participate in transfer ultimately modA d/t core weakness  Transfers                 General transfer comment: Not tested 2/2 b/l BKA; unsafe for slide board at this time  Ambulation/Gait                Stairs            Wheelchair Mobility    Modified Rankin (Stroke Patients Only)       Balance Overall balance assessment: Needs assistance   Sitting balance-Leahy Scale: Fair Sitting balance - Comments: Able to maintain balance without assistance following heavy TC and VC for a couple minutes at a time, able to reach across body with minimal wt shifting                                     Pertinent Vitals/Pain Pain Assessment: No/denies pain    Home Living Family/patient expects to be discharged to:: Private residence Living Arrangements: Spouse/significant other Available Help at Discharge: Family Type of Home: House Home Access: Ramped entrance     Home Layout: One level Home  Equipment: Cane - single point;Walker - 2 wheels;Grab bars - toilet;Wheelchair - Sport and exercise psychologist Comments: Son available PRN     Prior Function Level of Independence: Needs assistance   Gait / Transfers Assistance Needed: LLE prosthetic + MIN A for SPT bed <>w/c.      Comments: was able to walk with L prosthetic limb using RW "unlimited distance" at home. Reports 1 fall in past 6 months     Hand Dominance   Dominant Hand: Right    Extremity/Trunk Assessment   Upper Extremity  Assessment Upper Extremity Assessment: Defer to OT evaluation    Lower Extremity Assessment Lower Extremity Assessment: Generalized weakness    Cervical / Trunk Assessment Cervical / Trunk Assessment: Kyphotic  Communication   Communication: HOH  Cognition Arousal/Alertness: Awake/alert Behavior During Therapy: WFL for tasks assessed/performed Overall Cognitive Status: Impaired/Different from baseline Area of Impairment: Memory;Orientation;Attention;Following commands;Problem solving                   Current Attention Level: Selective;Alternating Memory: Decreased recall of precautions;Decreased short-term memory Following Commands: Follows one step commands with increased time     Problem Solving: Slow processing;Decreased initiation;Difficulty sequencing;Requires verbal cues;Requires tactile cues General Comments: Patient oriented x4 today though he takes increased time to answer these. Patient is able to follow commands, but time needed for this and cuing needed for this, safety concerns      General Comments      Exercises Other Exercises Other Exercises: Rolling: modA with patient able to comply with cuing for use of handrails with good participation, decreased core activation needed to complete without HOB elevated Other Exercises: Sidelying to sit patietn is ablet o participate and comply with cuing to "push" off UE modA ultimately Other Exercises: able to remain seated EOB 16mins with decreased support needed over time, able to complete reaching balance and min wt shifting in sitting Other Exercises: TA activation in bed with mini crunch (head lift from pillow) with pateitn understanding core strength needed for ind transfers.   Assessment/Plan    PT Assessment Patient needs continued PT services  PT Problem List Decreased strength;Decreased mobility;Decreased safety awareness;Decreased range of motion;Decreased coordination;Decreased activity  tolerance;Decreased balance       PT Treatment Interventions DME instruction;Therapeutic activities;Gait training;Therapeutic exercise;Balance training;Functional mobility training;Patient/family education;Manual techniques;Neuromuscular re-education    PT Goals (Current goals can be found in the Care Plan section)  Acute Rehab PT Goals Patient Stated Goal: To return home and complete STS transfers with bilat protheses PT Goal Formulation: With patient Time For Goal Achievement: 12/23/19 Potential to Achieve Goals: Fair    Frequency Min 2X/week   Barriers to discharge Decreased caregiver support      Co-evaluation               AM-PAC PT "6 Clicks" Mobility  Outcome Measure Help needed turning from your back to your side while in a flat bed without using bedrails?: A Lot Help needed moving from lying on your back to sitting on the side of a flat bed without using bedrails?: A Lot Help needed moving to and from a bed to a chair (including a wheelchair)?: A Lot Help needed standing up from a chair using your arms (e.g., wheelchair or bedside chair)?: Total Help needed to walk in hospital room?: Total Help needed climbing 3-5 steps with a railing? : Total 6 Click Score: 9    End of Session Equipment Utilized During Treatment: Gait belt Activity Tolerance: Patient tolerated treatment well  Patient left: in bed;with nursing/sitter in room;with call bell/phone within reach;with bed alarm set Nurse Communication: Mobility status PT Visit Diagnosis: Difficulty in walking, not elsewhere classified (R26.2);History of falling (Z91.81);Muscle weakness (generalized) (M62.81)    Time: 0375-4360 PT Time Calculation (min) (ACUTE ONLY): 20 min   Charges:   PT Evaluation $PT Eval Moderate Complexity: 1 Mod PT Treatments $Therapeutic Activity: 8-22 mins       Durwin Reges DPT   Durwin Reges 12/09/2019, 10:50 AM

## 2019-12-10 LAB — BASIC METABOLIC PANEL
Anion gap: 6 (ref 5–15)
BUN: 20 mg/dL (ref 8–23)
CO2: 27 mmol/L (ref 22–32)
Calcium: 8.3 mg/dL — ABNORMAL LOW (ref 8.9–10.3)
Chloride: 105 mmol/L (ref 98–111)
Creatinine, Ser: 1.08 mg/dL (ref 0.61–1.24)
GFR calc Af Amer: >60 mL/min (ref >60)
GFR calc non Af Amer: >60 mL/min (ref >60)
Glucose, Bld: 149 mg/dL — ABNORMAL HIGH (ref 70–99)
Potassium: 3.4 mmol/L — ABNORMAL LOW (ref 3.5–5.1)
Sodium: 138 mmol/L (ref 135–145)

## 2019-12-10 LAB — GLUCOSE, CAPILLARY
Glucose-Capillary: 115 mg/dL — ABNORMAL HIGH (ref 70–99)
Glucose-Capillary: 126 mg/dL — ABNORMAL HIGH (ref 70–99)
Glucose-Capillary: 127 mg/dL — ABNORMAL HIGH (ref 70–99)
Glucose-Capillary: 133 mg/dL — ABNORMAL HIGH (ref 70–99)
Glucose-Capillary: 137 mg/dL — ABNORMAL HIGH (ref 70–99)
Glucose-Capillary: 144 mg/dL — ABNORMAL HIGH (ref 70–99)
Glucose-Capillary: 192 mg/dL — ABNORMAL HIGH (ref 70–99)

## 2019-12-10 LAB — CBC
HCT: 36.6 % — ABNORMAL LOW (ref 39.0–52.0)
Hemoglobin: 12.3 g/dL — ABNORMAL LOW (ref 13.0–17.0)
MCH: 29.4 pg (ref 26.0–34.0)
MCHC: 33.6 g/dL (ref 30.0–36.0)
MCV: 87.6 fL (ref 80.0–100.0)
Platelets: 310 10*3/uL (ref 150–400)
RBC: 4.18 MIL/uL — ABNORMAL LOW (ref 4.22–5.81)
RDW: 13.6 % (ref 11.5–15.5)
WBC: 11 10*3/uL — ABNORMAL HIGH (ref 4.0–10.5)
nRBC: 0 % (ref 0.0–0.2)

## 2019-12-10 MED ORDER — POTASSIUM CHLORIDE CRYS ER 20 MEQ PO TBCR
40.0000 meq | EXTENDED_RELEASE_TABLET | Freq: Once | ORAL | Status: AC
  Administered 2019-12-10: 40 meq via ORAL
  Filled 2019-12-10: qty 2

## 2019-12-10 NOTE — Progress Notes (Signed)
  Speech Language Pathology Treatment: Dysphagia  Patient Details Name: [name] MRN: [id number] DOB: 1935/01/25 Today's Date: 12/10/2019 Time: 1130-1200 SLP Time Calculation (min) (ACUTE ONLY): 30 min  Assessment / Plan / Recommendation Clinical Impression  Pt with improved strength and alertness. Noted breakfast tray sitting at the side of the bed with only a few sips of the liquid taken. Pt reported he was worried that his wife was sick because she was not present. Attempted to call from the room without success. Nsg. notified and said she would call from the front desk after treatment session. Pt toelted sips of nectar thick liquid without difficulty. Noted delayed throat clearing after sips of thin liquids. Upper dentures in place, fitting well with denture adhesive. Given a few bites of soft solid, Pt was able to masticate and propell for a safe swallow. Will upgrade diet to dysphagia 2 with nectar thick liquids. Will continue with trials of thin liquid and possible mod ba swallow if Pt continues to have any difficulty. Pt was intubated for 6 days. Plans for skilled rehab after discharge.    HPI HPI: Per admitting H and P: "[name] is a 85 year old male with a past medical history significant for atrial fibrillation on Xarelto, HFpEF, CKD, diabetes mellitus, bilateral BKA's, PVD, sleep apnea, and COVID-19 infection in September 2020 who presents to [facility] ED on 11/30/2019 due to syncope and hyperglycemia.  Patient is currently intubated and sedated with no family present, therefore history is obtained from ED and nursing notes.  Per notes patient had a syncopal episode this evening, and when EMS arrived they found him face down on the floor very confused (he was noted to have a pulse and spontaneous breathing).  CBG was too high to read.  Patient was combative of which he was given 2 mg of IV Versed.  While in route to the hospital with EMS he went into V. tach and received 150 mg of  amiodarone.      SLP Plan  Continue with current plan of care;MBS   Skilled rehab    Recommendations  Diet recommendations: Dysphagia 2 (fine chop);Nectar-thick liquid Liquids provided via: Cup Medication Administration: Whole meds with liquid Supervision: Staff to assist with self feeding Compensations: Slow rate;Minimize environmental distractions;Small sips/bites Postural Changes and/or Swallow Maneuvers: Seated upright 90 degrees                Follow up Recommendations: Skilled Nursing facility SLP Visit Diagnosis: Dysphagia, oropharyngeal phase (R13.12) Plan: Continue with current plan of care;MBS       GO                Lucila Maine 12/10/2019, 12:06 PM

## 2019-12-10 NOTE — Care Management Important Message (Signed)
Important Message  Patient Details  Name: Letrell Attwood MRN: [id number] Date of Birth: 03-15-1935   Medicare Important Message Given:  Yes  Explained to son via telephone   Elease Hashimoto, LCSW 12/10/2019, 11:25 AM

## 2019-12-10 NOTE — Progress Notes (Signed)
   12/10/19 1130  Vitals  BP (!) 168/56  PRN med giving

## 2019-12-10 NOTE — Progress Notes (Signed)
PROGRESS NOTE    [name]  PPI:[id number] DOB: 1934-12-09 DOA: 11/30/2019 PCP: Corey Skains, MD    Assessment & Plan:   Active Problems:   Seizures ([place])   Goals of care, counseling/discussion   Palliative care by specialist   Acute hypoxic & hypercapnic respiratory failure: secondary to aspiration pneumonia, sepsis & seizures. Extubated 12/06/19 as per ICU. Continue on supplemental oxygen and wean as tolerated  Generalized weakness: PT/OT recs SNF.   Aspiration pneumonia: completed course of abxs. Continue on supplemental oxygen and wean as tolerated. Encourage incentive spirometry   DM2: poorly controlled. Hx of b/l BKA. Continue on lantus & SSI   Acute diastolic CHF exacerbation: continue on IV lasix as tolerated. Continue on supplemental oxygen and wean as tolerated   Obesity: BMI 28.6. Would benefit from weight loss   Likely AKI on CKD: baseline Cr/GFR is unknown, currently stage IIIa. Cr continues to trend down daily. Will continue to monitor   Hypokalemia: KCl repleted. Will continue to monitor   Leukocytosis: likely reactive. Will continue to monitor   Acute toxic metabolic encephalopathy: likely secondary to infection. Improved today. Hx of memory problems as per pt's wife.  Seizures: unknown type. Continue on keppra   DVT prophylaxis: xarelto  Code Status: DNR Family Communication  Disposition Plan: likely d/c to SNF  Status is: Inpatient  Remains inpatient appropriate because: unsafe d/c plan. Waiting on insurance auth for SNF    Dispo: The patient is from: Home              Anticipated d/c is to: SNF              Anticipated d/c date is: 2 days              Patient currently is medically stable for d/c     Consultants:  ICU    Procedures: intubation, ventilation & extubation    Antimicrobials:    Subjective: Pt c/o malaise  Objective: Vitals:   12/09/19 2131 12/10/19 0023 12/10/19 0409 12/10/19 0517  BP: (!) 175/74 (!)  167/77  (!) 158/75  Pulse: 74 62  (!) 51  Resp: 15 (!) 24  16  Temp: 98 F (36.7 C) 98.3 F (36.8 C)  98.7 F (37.1 C)  TempSrc: Oral Oral  Oral  SpO2: 97% 95%  96%  Weight:   83 kg   Height:        Intake/Output Summary (Last 24 hours) at 12/10/2019 0721 Last data filed at 12/10/2019 0000 Gross per 24 hour  Intake --  Output 800 ml  Net -800 ml   Filed Weights   12/02/19 0500 12/07/19 0500 12/10/19 0409  Weight: 83.4 kg 82.9 kg 83 kg    Examination:  General exam: Appears calm and comfortable  Respiratory system: decreased breath sounds b/l. No rales Cardiovascular system: S1 & S2 +. No  rubs, gallops or clicks. B/l BKA Gastrointestinal system: Abdomen is nondistended, soft and nontender. Normal bowel sounds heard. Central nervous system: Alert and oriented x person, place. Psychiatry: Judgement and insight appear abnormal. Mood & affect appropriate.     Data Reviewed: I have personally reviewed following labs and imaging studies  CBC: Recent Labs  Lab 12/05/19 0430 12/06/19 0347 12/08/19 0327 12/09/19 0343 12/10/19 0527  WBC 10.8* 8.4 8.4 10.8* 11.0*  NEUTROABS 8.1*  --   --   --   --   HGB 11.0* 12.8* 11.9* 12.1* 12.3*  HCT 31.7* 37.2* 34.7* 36.9* 36.6*  MCV 85.2 86.1 86.8 89.1 87.6  PLT 209 278 281 300 756   Basic Metabolic Panel: Recent Labs  Lab 12/04/19 0444 12/04/19 0444 12/05/19 0430 12/05/19 0430 12/06/19 0347 12/07/19 0347 12/08/19 0327 12/09/19 0343 12/10/19 0527  NA 140   < > 141   < > 139 143 141 140 138  K 3.8   < > 3.7   < > 4.9 3.7 3.5 3.3* 3.4*  CL 108   < > 109   < > 107 109 106 107 105  CO2 24   < > 24   < > 23 24 25 28 27   GLUCOSE 175*   < > 120*   < > 165* 120* 104* 104* 149*  BUN 29*   < > 29*   < > 34* 39* 38* 27* 20  CREATININE 1.49*   < > 1.30*   < > 1.56* 1.59* 1.56* 1.26* 1.08  CALCIUM 8.3*   < > 7.9*   < > 8.4* 8.4* 8.3* 8.1* 8.3*  MG 2.1  --  2.1  --  2.1 2.2  --   --   --   PHOS 3.4  --  2.8  --  4.9*  --   --   --    --    < > = values in this interval not displayed.   GFR: Estimated Creatinine Clearance: 54.4 mL/min (by C-G formula based on SCr of 1.08 mg/dL). Liver Function Tests: No results for input(s): AST, ALT, ALKPHOS, BILITOT, PROT, ALBUMIN in the last 168 hours. No results for input(s): LIPASE, AMYLASE in the last 168 hours. No results for input(s): AMMONIA in the last 168 hours. Coagulation Profile: No results for input(s): INR, PROTIME in the last 168 hours. Cardiac Enzymes: No results for input(s): CKTOTAL, CKMB, CKMBINDEX, TROPONINI in the last 168 hours. BNP (last 3 results) No results for input(s): PROBNP in the last 8760 hours. HbA1C: No results for input(s): HGBA1C in the last 72 hours. CBG: Recent Labs  Lab 12/09/19 1143 12/09/19 1738 12/09/19 2006 12/10/19 0021 12/10/19 0511  GLUCAP 222* 223* 196* 144* 133*   Lipid Profile: No results for input(s): CHOL, HDL, LDLCALC, TRIG, CHOLHDL, LDLDIRECT in the last 72 hours. Thyroid Function Tests: No results for input(s): TSH, T4TOTAL, FREET4, T3FREE, THYROIDAB in the last 72 hours. Anemia Panel: No results for input(s): VITAMINB12, FOLATE, FERRITIN, TIBC, IRON, RETICCTPCT in the last 72 hours. Sepsis Labs: Recent Labs  Lab 12/04/19 0444 12/05/19 0430  PROCALCITON 1.18 0.66    Recent Results (from the past 240 hour(s))  Culture, respiratory     Status: None   Collection Time: 11/30/19 11:18 AM   Specimen: Tracheal Aspirate; Respiratory  Result Value Ref Range Status   Specimen Description   Final    TRACHEAL ASPIRATE Performed at [facility], [address] [place], [place] [address]    Special Requests   Final    NONE Performed at [facility], [address] [place], [place] [address]    Gram Stain   Final    FEW WBC PRESENT, PREDOMINANTLY PMN FEW GRAM POSITIVE COCCI RARE YEAST    Culture   Final    FEW Consistent with normal respiratory flora. Performed at [facility],  [place] [address], [place], [place] [address]    Report Status 12/02/2019 FINAL  Final         Radiology Studies: No results found.      Scheduled Meds: . docusate  100 mg Oral BID  . feeding supplement (NEPRO CARB STEADY)  237 mL Oral BID BM  . insulin aspart  0-15 Units Subcutaneous Q4H  . insulin glargine  12 Units Subcutaneous QHS  . mouth rinse  15 mL Mouth Rinse BID  . multivitamin with minerals  1 tablet Oral Daily  . polyethylene glycol  17 g Oral Daily  . rivaroxaban  15 mg Oral Q supper   Continuous Infusions: . sodium chloride Stopped (12/09/19 0442)  . famotidine (PEPCID) IV 20 mg (12/09/19 2220)  . levETIRAcetam 500 mg (12/09/19 2031)     LOS: 10 days    Time spent: 32 mins    Wyvonnia Dusky, MD [facility] Pager [phone]  If 7PM-7AM, please contact night-coverage [email or web address] 12/10/2019, 7:21 AM

## 2019-12-10 NOTE — TOC Initial Note (Signed)
Transition of Care [facility]) - Initial/Assessment Note    Patient Details  Name: [name] MRN: [id number] Date of Birth: [birth year]  Transition of Care [facility]) CM/SW Contact:    Elease Hashimoto, LCSW Phone Number: 12/10/2019, 9:22 AM  Clinical Narrative:   Spoke with son-Kevin via telephone to discuss discharge plan. Son and pt are on board with SNF/Rehab. Son would like for worker to pursue [facility]. Pt has been vaccinated. Will complete FL2 and start bed search               Expected Discharge Plan: [place] Barriers to Discharge: No Barriers Identified   Patient Goals and CMS Choice Patient states their goals for this hospitalization and ongoing recovery are:: Needs rehab before returning home CMS Medicare.gov Compare Post Acute Care list provided to:: Patient Represenative (must comment) Miguel Dibble) Choice offered to / list presented to : [place] / [facility]  Expected Discharge Plan and Services Expected Discharge Plan: [place] In-house Referral: Clinical Social Work   Post Acute Care Choice: [place] Living arrangements for the past 2 months: [place]                                      Prior Living Arrangements/Services Living arrangements for the past 2 months: Single Family Home Lives with:: Self Patient language and need for interpreter reviewed:: No Do you feel safe going back to the place where you live?: Yes      Need for Family Participation in Patient Care: Yes (Comment) Care giver support system in place?: No (comment) Current home services: DME (wc, rw, bsc) Criminal Activity/Legal Involvement Pertinent to Current Situation/Hospitalization: No - Comment as needed  Activities of Daily Living      Permission Sought/Granted Permission sought to share information with : Facility Sport and exercise psychologist, Family Supports Permission granted to share information with :  Yes, Verbal Permission Granted  Share Information with NAME: [name]  Permission granted to share info w AGENCY: SNF facilities  Permission granted to share info w Relationship: son  Permission granted to share info w Contact Information: Admissions  Emotional Assessment Appearance:: Appears stated age Attitude/Demeanor/Rapport: Engaged Affect (typically observed): Adaptable Orientation: : Oriented to Self, Oriented to Place   Psych Involvement: No (comment)  Admission diagnosis:  Acute respiratory failure (HCC) [J96.00] Seizure ([place]) [R56.9] Seizures ([place]) [R56.9] V-tach ([place]) [I47.2] Atrial fibrillation with RVR ([place]) [I48.91] Diabetic ketoacidosis without coma associated with type 2 diabetes mellitus ([place]) [E11.10] Syncope, unspecified syncope type [R55] Aspiration pneumonia of both lungs due to gastric secretions, unspecified part of lung ([place]) [J69.0] Closed fracture of multiple ribs of both sides, initial encounter [S22.43XA] Patient Active Problem List   Diagnosis Date Noted  . Goals of care, counseling/discussion   . Palliative care by specialist   . Seizures ([place]) 11/30/2019  . ACS (acute coronary syndrome) ([place]) 06/24/2019  . Bradycardia 05/16/2019  . Paroxysmal A-fib ([place]) 03/27/2019  . Hx of BKA, right ([place]) 03/02/2019  . Severe protein-calorie malnutrition ([place]) 02/16/2019  . Acute systolic CHF (congestive heart failure) ([place]) 02/16/2019  . Acute respiratory failure with hypoxia ([place]) 02/08/2019  . Pneumonia due to COVID-19 virus 02/08/2019  . Below-knee amputation of right lower extremity ([place]) 01/15/2019  . Polyp of descending colon   . Iron deficiency anemia due to chronic blood loss   . Sacral decubitus ulcer, stage II (  [place]) 12/26/2018  . Acute respiratory failure ([place]) 12/20/2018  . Femur fracture ([place]) 10/13/2018  . Chest pain 06/03/2018  . NSTEMI (non-ST elevated myocardial infarction) ([place]) 01/16/2018  . Acute CHF (congestive heart failure) ([place])  11/23/2017  . Atrial fibrillation with rapid ventricular response ([place]) 10/15/2017  . BKA stump complication ([place]) 65/68/1275  . 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 ([place]) 02/10/2017  . S/P BKA (below knee amputation) unilateral, left ([place]) 02/01/2017  . Chronic diastolic heart failure ([place]) 01/21/2017  . Pressure injury of skin 01/18/2017  . Atherosclerosis of artery of extremity with gangrene ([place]) 01/05/2017  . Diabetic foot ulcer ([place]) 12/29/2016  . Ataxia 10/21/2016  . Atherosclerosis of native arteries of the extremities with ulceration ([place]) 10/12/2016  . History of femoral angiogram 09/06/2016  . Left leg pain 09/06/2016  . Leukocytosis 09/06/2016  . Generalized weakness 09/06/2016  . Atrial fibrillation, chronic ([place]) 08/30/2016  . Pneumonia 05/19/2016  . Hyperlipidemia 04/20/2016  . Back pain 04/19/2016  . Type 2 diabetes mellitus treated with insulin ([place]) 04/19/2016  . Essential hypertension 04/19/2016  . PAD (peripheral artery disease) ([place]) 04/19/2016  . Erectile dysfunction following radical prostatectomy 06/25/2015  . History of prostate cancer 12/23/2014  . Incontinence 12/23/2014   PCP:  Corey Skains, MD Pharmacy:   [facility] [address], [place] - [place] [address] [place] [address] Phone: [phone] Fax: [phone]  [facility] [place], [place] [address] [place] [address] Phone: [phone] Fax: [phone]  [place], [place] - [address] [facility] [address] [place] [address] Phone: [phone] Fax: [phone]  [id number] [place], [place] [address] Phone: [phone] Fax: [phone]     Social Determinants of Health (SDOH) Interventions    Readmission Risk Interventions Readmission Risk Prevention Plan 02/12/2019 12/25/2018  12/23/2018  Transportation Screening - Complete Complete  PCP or Specialist Appt within 3-5 Days - Complete -  HRI or Central - Complete Complete  Social Work Consult for [place] Planning/Counseling - Complete -  Palliative Care Screening - Not Applicable -  Medication Review Press photographer) - Complete Complete  PCP or Specialist appointment within 3-5 days of discharge Not Complete - -  PCP/Specialist Appt Not Complete comments not ready for dc, family requests SNF placement - -  [place] or Home Care Consult Complete - -  SW Recovery Care/Counseling Consult Complete - -  Palliative Care Screening Not Applicable - -  Skilled Nursing Facility Complete - -  Some recent data might be hidden

## 2019-12-10 NOTE — NC FL2 (Signed)
[place] LEVEL OF CARE SCREENING TOOL     IDENTIFICATION  Patient Name: [name] Birthdate: 07-Sep-1934 Sex: male Admission Date (Current Location): 11/30/2019  [place] and [place] Number:  Engineering geologist and Address:  [facility], [address], [place], [place] [address]      Provider Number: [id number]  Attending Physician Name and Address:  Wyvonnia Dusky, MD  Relative Name and Phone Number:  Jabarri Stefanelli [phone]    Current Level of Care: Hospital Recommended Level of Care: [place] Prior Approval Number:    Date Approved/Denied:   PASRR Number: [id number]  Discharge Plan: SNF    Current Diagnoses: Patient Active Problem List   Diagnosis Date Noted  . Goals of care, counseling/discussion   . Palliative care by specialist   . Seizures ([place]) 11/30/2019  . ACS (acute coronary syndrome) ([place]) 06/24/2019  . Bradycardia 05/16/2019  . Paroxysmal A-fib ([place]) 03/27/2019  . Hx of BKA, right ([place]) 03/02/2019  . Severe protein-calorie malnutrition ([place]) 02/16/2019  . Acute systolic CHF (congestive heart failure) ([place]) 02/16/2019  . Acute respiratory failure with hypoxia ([place]) 02/08/2019  . Pneumonia due to COVID-19 virus 02/08/2019  . Below-knee amputation of right lower extremity ([place]) 01/15/2019  . Polyp of descending colon   . Iron deficiency anemia due to chronic blood loss   . Sacral decubitus ulcer, stage II ([place]) 12/26/2018  . Acute respiratory failure ([place]) 12/20/2018  . Femur fracture ([place]) 10/13/2018  . Chest pain 06/03/2018  . NSTEMI (non-ST elevated myocardial infarction) ([place]) 01/16/2018  . Acute CHF (congestive heart failure) ([place]) 11/23/2017  . Atrial fibrillation with rapid ventricular response ([place]) 10/15/2017  . BKA stump complication ([place]) 70/48/8891  . 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 S/P BKA (below knee amputation)  unilateral, left ([place]) 02/01/2017  . Chronic diastolic heart failure ([place]) 01/21/2017  . Pressure injury of skin 01/18/2017  . 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 Diabetic foot ulcer ([place]) 12/29/2016  . Ataxia 10/21/2016  . Atherosclerosis of native arteries of the extremities with ulceration ([place]) 10/12/2016  . History of femoral angiogram 09/06/2016  . Left leg pain 09/06/2016  . Leukocytosis 09/06/2016  . Generalized weakness 09/06/2016  . Atrial fibrillation, chronic ([place]) 08/30/2016  . Pneumonia 05/19/2016  . Hyperlipidemia 04/20/2016  . Back pain 04/19/2016  . Type 2 diabetes mellitus treated with insulin ([place]) 04/19/2016  . Essential hypertension 04/19/2016  . PAD (peripheral artery disease) ([place]) 04/19/2016  . Erectile dysfunction following radical prostatectomy 06/25/2015  . History of prostate cancer 12/23/2014  . Incontinence 12/23/2014    Orientation RESPIRATION BLADDER Height & Weight     Self, Place  Normal External catheter Weight: 182 lb 15.7 oz (83 kg) Height:  5' 7.01" (170.2 cm)  BEHAVIORAL SYMPTOMS/MOOD NEUROLOGICAL BOWEL NUTRITION STATUS      Continent Diet (Dys 1 nectar thikc liquids)  AMBULATORY STATUS COMMUNICATION OF NEEDS Skin   Limited Assist Verbally Normal                       Personal Care Assistance Level of Assistance  Bathing, Dressing Bathing Assistance: Limited assistance   Dressing Assistance: Limited assistance     Functional Limitations Info             SPECIAL CARE FACTORS FREQUENCY  PT (By licensed PT), Speech therapy, OT (By licensed OT)     PT Frequency:  5x week OT Frequency: 5x week     Speech Therapy Frequency: 3x week      Contractures Contractures Info: Not present    Additional Factors Info  Code Status, Allergies, Isolation Precautions Code Status Info: DNR Allergies Info: Contrast, lohexol, Metrizamide     Isolation Precautions Info: ESBL     Current  Medications (12/10/2019):  This is the current hospital active medication list Current Facility-Administered Medications  Medication Dose Route Frequency Provider Last Rate Last Admin  . 0.9 %  sodium chloride infusion   Intravenous PRN Wyvonnia Dusky, MD   Stopped at 12/09/19 [phone]  . dextrose 50 % solution 0-50 mL  0-50 mL Intravenous PRN Alfred Levins, [place], MD      . docusate (COLACE) 50 MG/5ML liquid 100 mg  100 mg Oral BID Darel Hong D, NP   100 mg at 12/09/19 2032  . docusate sodium (COLACE) capsule 100 mg  100 mg Oral BID PRN Darel Hong D, NP      . famotidine (PEPCID) IVPB 20 mg premix  20 mg Intravenous Q12H Darel Hong D, NP 100 mL/hr at 12/09/19 2220 20 mg at 12/09/19 2220  . feeding supplement (NEPRO CARB STEADY) liquid 237 mL  237 mL Oral BID BM Wyvonnia Dusky, MD   237 mL at 12/09/19 0916  . haloperidol lactate (HALDOL) injection 2 mg  2 mg Intravenous Q6H PRN Bradly Bienenstock, NP   2 mg at 12/07/19 0105  . hydrALAZINE (APRESOLINE) injection 10 mg  10 mg Intravenous Q6H PRN Darel Hong D, NP   10 mg at 12/07/19 0012  . insulin aspart (novoLOG) injection 0-15 Units  0-15 Units Subcutaneous Q4H Dallie Piles, [facility]   2 Units at 12/10/19 0532  . insulin glargine (LANTUS) injection 12 Units  12 Units Subcutaneous QHS Dallie Piles, [facility]   12 Units at 12/09/19 2031  . ipratropium-albuterol (DUONEB) 0.5-2.5 (3) MG/3ML nebulizer solution 3 mL  3 mL Nebulization Q4H PRN Darel Hong D, NP      . labetalol (NORMODYNE) injection 10-20 mg  10-20 mg Intravenous Q2H PRN Flora Lipps, MD   20 mg at 12/06/19 1113  . levETIRAcetam (KEPPRA) IVPB 500 mg/100 mL premix  500 mg Intravenous Q12H Darel Hong D, NP 400 mL/hr at 12/09/19 2031 500 mg at 12/09/19 2031  . MEDLINE mouth rinse  15 mL Mouth Rinse BID Flora Lipps, MD   15 mL at 12/09/19 2033  . morphine 2 MG/ML injection 2-4 mg  2-4 mg Intravenous Q1H PRN Flora Lipps, MD   2 mg at 12/06/19 2053  . multivitamin  with minerals tablet 1 tablet  1 tablet Oral Daily Wyvonnia Dusky, MD   1 tablet at 12/09/19 0914  . polyethylene glycol (MIRALAX / GLYCOLAX) packet 17 g  17 g Oral Daily Darel Hong D, NP   17 g at 12/09/19 0914  . polyethylene glycol (MIRALAX / GLYCOLAX) packet 17 g  17 g Oral Daily PRN Darel Hong D, NP      . potassium chloride SA (KLOR-CON) CR tablet 40 mEq  40 mEq Oral Once Wyvonnia Dusky, MD      . Rivaroxaban Alveda Reasons) tablet 15 mg  15 mg Oral Q supper Ottie Glazier, MD   15 mg at 12/09/19 1753     Discharge Medications: Please see discharge summary for a list of discharge medications.  Relevant Imaging Results:  Relevant Lab Results:   Additional Information SSN: [id number]  Male [place], Gardiner Rhyme,  LCSW

## 2019-12-11 DIAGNOSIS — R531 Weakness: Secondary | ICD-10-CM

## 2019-12-11 DIAGNOSIS — N179 Acute kidney failure, unspecified: Secondary | ICD-10-CM

## 2019-12-11 LAB — GLUCOSE, CAPILLARY
Glucose-Capillary: 80 mg/dL (ref 70–99)
Glucose-Capillary: 67 mg/dL — ABNORMAL LOW (ref 70–99)
Glucose-Capillary: 128 mg/dL — ABNORMAL HIGH (ref 70–99)
Glucose-Capillary: 148 mg/dL — ABNORMAL HIGH (ref 70–99)

## 2019-12-11 LAB — CBC
HCT: 39.5 % (ref 39.0–52.0)
Hemoglobin: 13.6 g/dL (ref 13.0–17.0)
MCH: 30 pg (ref 26.0–34.0)
MCHC: 34.4 g/dL (ref 30.0–36.0)
MCV: 87.2 fL (ref 80.0–100.0)
Platelets: 351 10*3/uL (ref 150–400)
RBC: 4.53 MIL/uL (ref 4.22–5.81)
RDW: 14.1 % (ref 11.5–15.5)
WBC: 12.6 10*3/uL — ABNORMAL HIGH (ref 4.0–10.5)
nRBC: 0 % (ref 0.0–0.2)

## 2019-12-11 LAB — BASIC METABOLIC PANEL
Anion gap: 10 (ref 5–15)
BUN: 17 mg/dL (ref 8–23)
CO2: 28 mmol/L (ref 22–32)
Calcium: 8.8 mg/dL — ABNORMAL LOW (ref 8.9–10.3)
Chloride: 103 mmol/L (ref 98–111)
Creatinine, Ser: 1.24 mg/dL (ref 0.61–1.24)
GFR calc Af Amer: >60 mL/min (ref >60)
GFR calc non Af Amer: 54 mL/min — ABNORMAL LOW (ref >60)
Glucose, Bld: 79 mg/dL (ref 70–99)
Potassium: 3.5 mmol/L (ref 3.5–5.1)
Sodium: 141 mmol/L (ref 135–145)

## 2019-12-11 MED ORDER — DEXTROSE 50 % IV SOLN
INTRAVENOUS | Status: AC
  Administered 2019-12-11: 09:00:00 25 mL via INTRAVENOUS
  Filled 2019-12-11: qty 50

## 2019-12-11 NOTE — TOC Transition Note (Signed)
Transition of Care [facility]) - CM/SW Discharge Note   Patient Details  Name: [name] MRN: [id number] Date of Birth: 1934-06-18  [facility]) CM/SW Contact:  Elease Hashimoto, LCSW Phone Number: 12/11/2019, 9:12 AM   Clinical Narrative:  Pt has been approved for authorization to transfer to [facility] today. MD is aware and working on DC paperwork, once completed will call EMS for transport. Bedside RN aware of plan and will get ready. Aware to call report to [phone]. Going to room 702. Auth 7/19-7/21 ref [id number]. Tammy[facility] aware of transfer.    Final next level of care: Skilled Nursing Facility Barriers to Discharge: No Barriers Identified   Patient Goals and CMS Choice Patient states their goals for this hospitalization and ongoing recovery are:: Needs rehab before returning home CMS Medicare.gov Compare Post Acute Care list provided to:: Patient Represenative (must comment) (Kevin-son) Choice offered to / list presented to : [place] / [place]  Discharge Placement   Existing PASRR number confirmed : 12/10/19          Patient [facility] bed at: [facility] [place] Patient to be transferred to facility by: EMS Name of family member notified: Kevin-son Patient and family notified of of transfer: 12/11/19  Discharge Plan and Services In-house Referral: Clinical Social Work   Post Acute Care Choice: [place]                               Social Determinants of Health (SDOH) Interventions     Readmission Risk Interventions Readmission Risk Prevention Plan 02/12/2019 12/25/2018 12/23/2018  Transportation Screening - Complete Complete  PCP or Specialist Appt within 3-5 Days - Complete -  [place] or [place] - Complete Complete  Social Work Consult for [place] Planning/Counseling - Complete -  Palliative Care Screening - Not Applicable -  Medication Review Press photographer) - Complete Complete  PCP or Specialist  appointment within 3-5 days of discharge Not Complete - -  PCP/Specialist Appt Not Complete comments not ready for dc, family requests SNF placement - -  [place] or Home Care Consult Complete - -  SW Recovery Care/Counseling Consult Complete - -  Palliative Care Screening Not Applicable - -  Skilled Nursing Facility Complete - -  Some recent data might be hidden

## 2019-12-11 NOTE — TOC Progression Note (Signed)
Transition of Care [facility]) - Progression Note    Patient Details  Name: [name] MRN: [id number] Date of Birth: [birth year]  Transition of Care [facility]) CM/SW Contact  Elianny Buxbaum, Gardiner Rhyme, LCSW Phone Number: 12/11/2019, 8:06 AM  Clinical Narrative:   Pt has been [facility] today as long as medically stable. Will await MD rounds and work on transfer.    Expected Discharge Plan: Skilled Nursing Facility Barriers to Discharge: No Barriers Identified  Expected Discharge Plan and Services Expected Discharge Plan: [place] In-house Referral: Clinical Social Work   Post Acute Care Choice: [place] Living arrangements for the past 2 months: Single Family Home                                       Social Determinants of Health (SDOH) Interventions    Readmission Risk Interventions Readmission Risk Prevention Plan 02/12/2019 12/25/2018 12/23/2018  Transportation Screening - Complete Complete  PCP or Specialist Appt within 3-5 Days - Complete -  HRI or [place] - Complete Complete  Social Work Consult for [place] Planning/Counseling - Complete -  Palliative Care Screening - Not Applicable -  Medication Review Press photographer) - Complete Complete  PCP or Specialist appointment within 3-5 days of discharge Not Complete - -  PCP/Specialist Appt Not Complete comments not ready for dc, family requests SNF placement - -  [place] or Home Care Consult Complete - -  SW Recovery Care/Counseling Consult Complete - -  Palliative Care Screening Not Applicable - -  Skilled Nursing Facility Complete - -  Some recent data might be hidden

## 2019-12-11 NOTE — Progress Notes (Signed)
Report giving to [facility], wedding band and 2 blankets giving to ems

## 2019-12-11 NOTE — Discharge Summary (Signed)
Physician Discharge Summary  Tegh Franek JKD:[id number] DOB: 1934-09-12 DOA: 11/30/2019  PCP: Corey Skains, MD  Admit date: 11/30/2019 Discharge date: 12/11/2019  Admitted From: home Disposition:  SNF  Recommendations for Outpatient Follow-up:  1. Follow up with PCP in 1-2 weeks 2. F/u neuro in 1 week   Home Health: no Equipment/Devices:  Discharge Condition: stable CODE STATUS: DNR Diet recommendation: Heart Healthy / Carb Modified   Brief/Interim Summary: HPI was taken from NP Dewaine Conger: Torrez Renfroe is a 85 year old male with a past medical history significant for atrial fibrillation on Xarelto, HFpEF, CKD, diabetes mellitus, bilateral BKA's, PVD, sleep apnea, and COVID-19 infection in September 2020 who presents to [facility] ED on 11/30/2019 due to syncope and hyperglycemia.  Patient is currently intubated and sedated with no family present, therefore history is obtained from ED and nursing notes.  Per notes patient had a syncopal episode this evening, and when EMS arrived they found him face down on the floor very confused (he was noted to have a pulse and spontaneous breathing).  CBG was too high to read.  Patient was combative of which he was given 2 mg of IV Versed.  While in route to the hospital with EMS he went into V. tach and received 150 mg of amiodarone.  Upon arrival to the ED he was very agitated, not answering questions, with increased work of breathing and gurgling respirations.  EKG with atrial fibrillation with diffuse deep ST depressions and elevations and the anterior leads, with intermittent runs of V. tach despite receiving 150 mg of amiodarone in route to the hospital with EMS.  ED provider consulted with Dr. Fletcher Anon of Cardiology who evaluated the EKG and determined the patient did not meet STEMI criteria.  Cardiology recommended starting patient on heparin and continuing amiodarone drip. Patient was noted to hypoxic with bilateral crackles for which he was  subsequently placed on BiPAP.  He was noted to have a  witnessed generalized tonic-clonic seizure which he was given 2 mg of IV Ativan and was intubated for airway protection (noted to have some vomit in his posterior oropharynx during intubation concerning for aspiration).    CT of the head was negative for any acute intracranial process, CT cervical spine is negative, and CT chest/abdomen/pelvis with posterior left rib fractures (no Pneumothorax), aspiration pneumonia, pulmonary edema, good positioning of the ETT, and non-obstructing right Neprholithiasis.  Patient again developed A. fib with RVR which he received IV metoprolol. Lab work shows glucose 679, BUN 28, Creatinine 1.72, anion gap 16, BNP 390, high sensitivity troponin 39, lactic acid 7.1, WBC 21.8 (with Neutrophilia).  His COVID-19 PCR is negative. Urinalysis negative for ketones and negative for UTI.  Given his severely elevated glucose he was placed on insulin drip.  PCCM is asked to admit the pt to ICU for further workup and treatment of Tonic-Clonic seizure, Acute Hypoxic Hypercapnic Respiratory Failure secondary to Aspiration Pneumonia & Pulmonary Edema, A-fib w/ RVR, intermittent nonsustained V-tach, and questionable NSTEMI.  Cardiology and Neurology are consulted.  As per Dr. Mortimer Fries (ICU): 11/30/19- Patient remains on MV weaning FiO2 to 80%, s/p TTE this AM, plan for EEG tonight. Discussed short term care plan with neurology Dr Doy Mince. Tracheal aspirate with thick solid chunks of discolored material concerning for aspiration.INTUBATED/SEDATED  12/01/19- Patient improved , FiO2 50% 12/02/19 - patient is further weaned on FIO2 , he did not tolerate SBT, had sustained VT today with pulse responsive to beta blockade. Will re-attempt SBT tommorow  7/11  failed weaning trials ?family 7/12 remains on vent 7/13 failed weaning trial, multiorgan failure 7/13 failed weaning trials 7/13 patient made DNR by family 7/14 patient with severe  multiorgan failure, failed Weaning trials-witnessed by family  Hospital Course from Dr. Lenise Herald 7/16-7/20/21: Pt was already extubated and on [place] when I saw the pt on 12/07/19. Pt is currently on 2L [place] and likely can be weaned off with more time. Pt did complete his abx course while in the ICU. Continue on bronchodilators and incentive spirometry. Of note, PT/OT saw the pt and recommended SNF. For information, please see previous progress notes   Discharge Diagnoses:  Active Problems:   Seizures ([place])   Goals of care, counseling/discussion   Palliative care by specialist  Acute hypoxic & hypercapnic respiratory failure: secondary to aspiration pneumonia, sepsis & seizures. Extubated 12/06/19 as per ICU. Currently on 2L [place]  Generalized weakness: PT/OT recs SNF.   Aspiration pneumonia: completed course of abxs. Continue on supplemental oxygen and wean as tolerated. Encourage incentive spirometry   DM2: poorly controlled. Hx of b/l BKA. Continue on lantus & SSI   Acute diastolic CHF exacerbation: continue on IV lasix as tolerated. Continue on supplemental oxygen and wean as tolerated   Obesity: BMI 28.6. Would benefit from weight loss   Likely AKI on CKD: baseline Cr/GFR is unknown, currently stage IIIa. Cr is labile. Will continue to monitor   Hypokalemia: WNL today. Will continue to monitor   Leukocytosis: likely reactive. Will continue to monitor   Acute toxic metabolic encephalopathy: likely secondary to infection. Improved today. Hx of memory problems as per pt's wife.  Seizures: unknown type. Continue on depakote  Discharge Instructions  Discharge Instructions    Diet - low sodium heart healthy   Complete by: As directed    Diet Carb Modified   Complete by: As directed    Discharge instructions   Complete by: As directed    F/u PCP in 1-2 weeks. F/u neuro in 1 week.   Increase activity slowly   Complete by: As directed      Allergies as of 12/11/2019      Reactions    Contrast Media [iodinated Diagnostic Agents] Shortness Of Breath   SOB   Iohexol Shortness Of Breath    Desc: Respiratory Distress, Laryngedema, diaphoresis, Onset Date: [id number]   Metrizamide Shortness Of Breath   SOB SOB SOB   Latex Rash      Medication List    STOP taking these medications   ascorbic acid 250 MG tablet Commonly known as: VITAMIN C   collagenase ointment Commonly known as: SANTYL   doxycycline 100 MG capsule Commonly known as: VIBRAMYCIN   HYDROcodone-acetaminophen 5-325 MG tablet Commonly known as: NORCO/VICODIN   silver sulfADIAZINE 1 % cream Commonly known as: SILVADENE     TAKE these medications   acetaminophen 325 MG tablet Commonly known as: TYLENOL Take 650 mg by mouth every 6 (six) hours as needed.   amiodarone 200 MG tablet Commonly known as: PACERONE Take 200 mg by mouth daily.   aspirin EC 81 MG tablet Take 1 tablet (81 mg total) by mouth daily.   atorvastatin 80 MG tablet Commonly known as: LIPITOR Take 80 mg by mouth every evening.   bisacodyl 5 MG EC tablet Commonly known as: DULCOLAX Take 1 tablet (5 mg total) by mouth daily as needed for moderate constipation.   chlorthalidone 25 MG tablet Commonly known as: HYGROTON Take 25 mg by mouth daily.   divalproex  500 MG 24 hr tablet Commonly known as: DEPAKOTE ER Take 1 tablet (500 mg total) by mouth daily.   Ensure Max Protein Liqd Take 330 mLs (11 oz total) by mouth 2 (two) times daily.   ferrous sulfate 325 (65 FE) MG tablet Take 1 tablet (325 mg total) by mouth daily with breakfast.   gabapentin 300 MG capsule Commonly known as: NEURONTIN Take 300 mg by mouth 3 (three) times daily.   insulin glargine 100 UNIT/ML injection Commonly known as: LANTUS Inject 0.15 mLs (15 Units total) into the skin at bedtime.   insulin lispro 100 UNIT/ML injection Commonly known as: HumaLOG Inject 0.03 mLs (3 Units total) into the skin 3 (three) times daily with meals. What  changed: additional instructions   isosorbide mononitrate 30 MG 24 hr tablet Commonly known as: IMDUR Take 60 mg by mouth daily.   metFORMIN 500 MG tablet Commonly known as: GLUCOPHAGE Take 500 mg by mouth daily with breakfast.   metoprolol tartrate 25 MG tablet Commonly known as: LOPRESSOR Take 25 mg by mouth 3 (three) times daily.   multivitamin with minerals Tabs tablet Take 1 tablet by mouth daily.   nitroGLYCERIN 0.4 MG SL tablet Commonly known as: NITROSTAT Place 1 tablet (0.4 mg total) under the tongue every 5 (five) minutes as needed for chest pain.   potassium chloride 20 MEQ packet Commonly known as: KLOR-CON Take by mouth 2 (two) times daily.   Rivaroxaban 15 MG Tabs tablet Commonly known as: XARELTO Take 1 tablet (15 mg total) by mouth daily with supper. Hold Xarelto for 3 days.  Start on January 09, 2019.   senna 8.6 MG Tabs tablet Commonly known as: SENOKOT Take 1 tablet (8.6 mg total) by mouth daily as needed for mild constipation.   Tiadylt ER 120 MG 24 hr capsule Generic drug: diltiazem Take 120 mg by mouth daily.   Vitamin D-3 25 MCG (1000 UT) Caps Take 1,000 Units by mouth daily at 6 (six) AM.   zinc sulfate 220 (50 Zn) MG capsule Take 220 mg by mouth daily.       Contact information for after-discharge care    Destination    [facility] Preferred SNF .   Service: Skilled Nursing Contact information: [place] [phone]                 Allergies  Allergen Reactions  . Contrast Media [Iodinated Diagnostic Agents] Shortness Of Breath    SOB  . Iohexol Shortness Of Breath     Desc: Respiratory Distress, Laryngedema, diaphoresis, Onset Date: [id number]   . Metrizamide Shortness Of Breath    SOB SOB SOB   . Latex Rash    Consultations:  ICU  neuro   Procedures/Studies: EEG  Result Date: 11/30/2019 Alexis Goodell, MD     11/30/2019  4:20 PM ELECTROENCEPHALOGRAM  REPORT Patient: [name]       Room #: IC06A-AA EEG No. ID: [id number] Age: 85 y.o.        Sex: male Requesting Physician: Lanney Gins Report Date:  11/30/2019       Interpreting Physician: Alexis Goodell History: Treven Holtman is an 85 y.o. male s/p arrest and seizure Medications: Keppra, Unasyn, Insulin, Propofol, Vasopressin Conditions of Recording:  This is a 21 channel routine scalp EEG performed with bipolar and monopolar montages arranged in accordance to the international 10/20 system of electrode placement. One channel was dedicated to EKG recording. The patient is  in the intubated and sedated state. Description:  The background activity is discontinuous. It consists of low voltage bursts of polyspike, spike and slow wave activity alternating with periods of attenuation. The burst activity lasts up to 4 seconds.  The periods of attenuation last up to 6 seconds. This discontinuous activity is maintained throughout the tracing. No activation procedures were performed.  Hyperventilation and intermittent photic stimulation were not performed. IMPRESSION: This is an abnormal EEG due to a burst-suppression pattern seen throughout the tracing.  Burst suppression pattern can be seen in a variety of circumstances, including anesthesia, drug intoxication, hypothermia, as well as cerebral anoxia.  Clinical/neurological, and radiographic correlation advised.  Alexis Goodell, MD Neurology [phone] 11/30/2019, 4:16 PM   CT ABDOMEN PELVIS WO CONTRAST  Result Date: 11/30/2019 CLINICAL DATA:  Found down with seizure-like activity, V-tach, seizure while in ED EXAM: CT HEAD WITHOUT CONTRAST CT CERVICAL SPINE WITHOUT CONTRAST CT CHEST, ABDOMEN AND PELVIS WITHOUT CONTRAST TECHNIQUE: Contiguous axial images were obtained from the base of the skull through the vertex without intravenous contrast. Multidetector CT imaging of the cervical spine was performed without intravenous contrast. Multiplanar CT image  reconstructions were also generated. Multidetector CT imaging of the chest, abdomen and pelvis was performed following the standard protocol without IV contrast. COMPARISON:  CT head 09/16/2019, MR 06/05/2018, same day chest radiograph, CT abdomen pelvis 09/17/2006 FINDINGS: CT HEAD FINDINGS Brain: No evidence of acute infarction, hemorrhage, hydrocephalus, extra-axial collection or mass lesion/mass effect. Symmetric prominence of the ventricles, cisterns and sulci compatible with parenchymal volume loss. Patchy areas of white matter hypoattenuation are most compatible with chronic microvascular angiopathy. Vascular: Atherosclerotic calcification of the carotid siphons and intradural vertebral arteries. No hyperdense vessel. Skull: No acute scalp swelling or hematoma. No calvarial fracture or other acute or suspicious osseous lesion. Chronic left occipital scalp infiltration, likely scarring. Small right frontal scalp lipoma, stable. Sinuses/Orbits: Paranasal sinuses and mastoid air cells are predominantly clear. Pneumatization of the petrous apices. Orbital structures are unremarkable aside from prior lens extractions. Other: Extensive debris in the external auditory canals. Mild bilateral TMJ arthrosis. Largely edentulous with mild mandibular prognathism. CT CERVICAL FINDINGS Alignment: Stabilization collar is absent. Mild straightening of the upper cervical lordosis with reversal at the C4-5 levels. Mild retrolisthesis C4 on 5 and C5 on 6 is likely degenerative in nature. No evidence of traumatic listhesis. No abnormally widened, perched or jumped facets. Normal alignment of the craniocervical and atlantoaxial articulations accounting for leftward cranial rotation. Skull base and vertebrae: No acute skull base fracture. No vertebral fracture or height loss. Arthrosis present at the atlantodental interval with some mild calcific pannus formation. Multilevel spondylitic changes are detailed below. Soft tissues and  spinal canal: No pre or paravertebral fluid or swelling. No visible canal hematoma. Enthesopathic changes in the nuchal ligament. Disc levels: Multilevel intervertebral disc height loss with spondylitic endplate changes. Features maximal C4-5 and C5-6 with small posterior disc osteophyte complexes resulting in at most mild canal stenosis. Uncinate spurring facet hypertrophic changes are maximal at these levels as well with moderate to severe stenosis on the left at C5-6 and more moderate changes on the right at C4-5. Additional multilevel foraminal narrowing is mild. Other: Secretions noted in the posterior naso and oropharynx above the level of the inflated endotracheal balloon. Extensive cervical carotid atherosclerosis. No concerning thyroid nodules. CT CHEST FINDINGS Cardiovascular: Limited assessment of the vasculature in the absence of contrast media. Cardiac size within normal limits. Small pericardial effusion. Extensive three-vessel coronary artery disease.  Extensive calcifications upon the aortic valve leaflets. Atherosclerotic plaque within the normal caliber aorta. Shared origin of brachiocephalic and left common carotid artery origins. Calcific plaque throughout the proximal great vessels. Central pulmonary arteries are borderline enlarged. Luminal evaluation precluded in the absence of contrast media. Mediastinum/Nodes: No mediastinal fluid or gas. Normal thyroid gland and thoracic inlet. No worrisome mediastinal or axillary adenopathy. Hilar nodal evaluation is limited in the absence of intravenous contrast media. Endotracheal tube terminates in the mid to lower trachea, 3 cm from the carina. Transesophageal tube tip terminates in the gastric body with side port distal to the GE junction. Lungs/Pleura: Heterogeneous dependent areas of mixed ground-glass and dense consolidative opacities are present in both lungs with multiple fluid-filled dependent airways as well as interlobular septal thickening and  fissural thickening and small bilateral pleural effusions with further atelectatic changes and volume loss as well. No pneumothorax. Musculoskeletal: Posterior left third through sixth left rib fractures. Posterior right rib 4 rib fractures. No other acute osseous injuries in the chest wall. Asymmetric sternoclavicular arthrosis, increased on the right. Degenerative changes in both shoulders. Multilevel degenerative changes present spine Multilevel flowing anterior osteophytosis, compatible with features of diffuse idiopathic skeletal hyperostosis (DISH). Mild body wall edema. CT ABDOMEN AND PELVIS FINDINGS Hepatobiliary: No visible hepatic injury or concerning liver lesion on this unenhanced CT. No perihepatic hematoma. Smooth surface contour. Normal attenuation. Patient is post cholecystectomy. Slight prominence of the biliary tree likely related to post cholecystectomy reservoir effect without visible calcified intraductal gallstones. Pancreas: Moderate pancreatic atrophy and partial fatty replacement of the pancreas. No ductal dilatation, discernible lesions, or inflammation. Spleen: No direct splenic injury. No perisplenic hematoma. Normal splenic size. No concerning lesions. Few scattered vascular calcifications. Adrenals/Urinary Tract: Mild lobular thickening of the adrenal gland could reflect some senescent hyperplasia without concerning nodules. No evidence of adrenal hemorrhage. No direct renal injury or perinephric hematoma. Bilateral renal atrophy with mild symmetric perinephric stranding, possibly related to senescent change or diminished renal function. Few vascular calcifications are present in both kidneys. Collecting system calculus seen in the upper pole right kidney. No obstructive urolithiasis is evident. Hyperdense 18 mm cystic structure arising from the posterior interpolar right kidney (3/72) additional subcentimeter hyperdense cystic structure arising from the upper pole left kidney (3/65).  No other concerning renal lesions. Urinary bladder is largely decompressed by Foley catheter at the time of exam and therefore poorly evaluated by CT imaging. Further obscuration by streak artifact from a hip prosthesis. Stomach/Bowel: Transesophageal tube in appropriate position terminating in the gastric body with side port distal to the GE junction as detailed above. Mild tenting of the anterior stomach is a typical finding for placement of a transesophageal tube. Small duodenal lipoma. Small distal duodenal diverticulum (3/69). No small bowel thickening or dilatation. Mild fecalization of the distal small bowel contents can reflect some slowed intestinal transit Appendix is not visualized. No focal inflammation the vicinity of the cecum to suggest an occult appendicitis. No colonic dilatation or wall thickening. Vascular/Lymphatic: Extensive atherosclerotic calcification throughout the abdominal aorta and branch vessels. Luminal evaluation precluded in the absence of contrast media. No aneurysm or ectasia. No periaortic stranding or hemorrhage. No suspicious or enlarged lymph nodes in the included lymphatic chains. Reproductive: In prostate traversed by the Foley catheter. Coarse eccentric calcification of the prostate. No concerning abnormalities of the prostate or seminal vesicles. Nonspecific penile calcifications are noted. Other: No abdominopelvic free air or fluid. No bowel containing hernia. Small fat containing umbilical hernia. No traumatic  abdominal wall dehiscence. No body wall or retroperitoneal hematoma. Musculoskeletal: The osseous structures appear diffusely demineralized which may limit detection of small or nondisplaced fractures. No acute osseous abnormality or suspicious osseous lesion. Extensive streak artifact related to patient's right hip arthroplasty. Multilevel degenerative changes are present in the imaged portions of the spine. Additional degenerative changes in the hips and pelvis. No  acute osseous abnormality of the included upper extremities. IMPRESSION: CT HEAD: 1. No acute intracranial abnormality. 2. Stable parenchymal volume loss and chronic microvascular ischemic changes. 3. Debris in the external auditory canals. CT CERVICAL SPINE: 1. No acute cervical spine fracture or traumatic listhesis. 2. Multilevel degenerative changes of the cervical spine, as described above. CT CHEST, ABDOMEN AND PELVIS 1. Unenhanced CT was performed per clinician order. Lack of IV contrast limits sensitivity and specificity, especially for evaluation of the vasculature and abdominal/pelvic solid viscera. 2. Posterior left third through sixth rib fractures. Posterior right rib 4 rib fractures. Can be seen in the setting of resuscitative efforts. No pneumothorax. 3. Heterogeneous largely dependent areas of mixed ground-glass and dense consolidation in both lungs with fluid-filled dependent airways as well as septal and fissural thickening, and small bilateral pleural effusions as well as features of volume loss. Such findings are worrisome for sequela of aspiration likely with superimposed pulmonary edema and volume loss. Mild contusive changes may also be present in the setting of resuscitation. 4. Endotracheal tube in the mid to lower trachea, 3 cm from the carina. 5. Transesophageal tube in appropriate position terminating in the gastric body with side port distal to the GE junction. 6. No other acute abdominopelvic abnormalities. 7. Nonobstructing right nephrolithiasis. 8. Hyperdense cystic structures arising from the lateral right interpolar kidney and upper pole left kidney., possible hyperdense exophytic cyst. Recommend outpatient renal ultrasound. 9. Aortic Atherosclerosis (ICD10-I70.0). Electronically Signed   By: Lovena Le M.D.   On: 11/30/2019 02:57   CT Head Wo Contrast  Result Date: 11/30/2019 CLINICAL DATA:  Found down with seizure-like activity, V-tach, seizure while in ED EXAM: CT HEAD  WITHOUT CONTRAST CT CERVICAL SPINE WITHOUT CONTRAST CT CHEST, ABDOMEN AND PELVIS WITHOUT CONTRAST TECHNIQUE: Contiguous axial images were obtained from the base of the skull through the vertex without intravenous contrast. Multidetector CT imaging of the cervical spine was performed without intravenous contrast. Multiplanar CT image reconstructions were also generated. Multidetector CT imaging of the chest, abdomen and pelvis was performed following the standard protocol without IV contrast. COMPARISON:  CT head 09/16/2019, MR 06/05/2018, same day chest radiograph, CT abdomen pelvis 09/17/2006 FINDINGS: CT HEAD FINDINGS Brain: No evidence of acute infarction, hemorrhage, hydrocephalus, extra-axial collection or mass lesion/mass effect. Symmetric prominence of the ventricles, cisterns and sulci compatible with parenchymal volume loss. Patchy areas of white matter hypoattenuation are most compatible with chronic microvascular angiopathy. Vascular: Atherosclerotic calcification of the carotid siphons and intradural vertebral arteries. No hyperdense vessel. Skull: No acute scalp swelling or hematoma. No calvarial fracture or other acute or suspicious osseous lesion. Chronic left occipital scalp infiltration, likely scarring. Small right frontal scalp lipoma, stable. Sinuses/Orbits: Paranasal sinuses and mastoid air cells are predominantly clear. Pneumatization of the petrous apices. Orbital structures are unremarkable aside from prior lens extractions. Other: Extensive debris in the external auditory canals. Mild bilateral TMJ arthrosis. Largely edentulous with mild mandibular prognathism. CT CERVICAL FINDINGS Alignment: Stabilization collar is absent. Mild straightening of the upper cervical lordosis with reversal at the C4-5 levels. Mild retrolisthesis C4 on 5 and C5 on 6 is likely degenerative  in nature. No evidence of traumatic listhesis. No abnormally widened, perched or jumped facets. Normal alignment of the  craniocervical and atlantoaxial articulations accounting for leftward cranial rotation. Skull base and vertebrae: No acute skull base fracture. No vertebral fracture or height loss. Arthrosis present at the atlantodental interval with some mild calcific pannus formation. Multilevel spondylitic changes are detailed below. Soft tissues and spinal canal: No pre or paravertebral fluid or swelling. No visible canal hematoma. Enthesopathic changes in the nuchal ligament. Disc levels: Multilevel intervertebral disc height loss with spondylitic endplate changes. Features maximal C4-5 and C5-6 with small posterior disc osteophyte complexes resulting in at most mild canal stenosis. Uncinate spurring facet hypertrophic changes are maximal at these levels as well with moderate to severe stenosis on the left at C5-6 and more moderate changes on the right at C4-5. Additional multilevel foraminal narrowing is mild. Other: Secretions noted in the posterior naso and oropharynx above the level of the inflated endotracheal balloon. Extensive cervical carotid atherosclerosis. No concerning thyroid nodules. CT CHEST FINDINGS Cardiovascular: Limited assessment of the vasculature in the absence of contrast media. Cardiac size within normal limits. Small pericardial effusion. Extensive three-vessel coronary artery disease. Extensive calcifications upon the aortic valve leaflets. Atherosclerotic plaque within the normal caliber aorta. Shared origin of brachiocephalic and left common carotid artery origins. Calcific plaque throughout the proximal great vessels. Central pulmonary arteries are borderline enlarged. Luminal evaluation precluded in the absence of contrast media. Mediastinum/Nodes: No mediastinal fluid or gas. Normal thyroid gland and thoracic inlet. No worrisome mediastinal or axillary adenopathy. Hilar nodal evaluation is limited in the absence of intravenous contrast media. Endotracheal tube terminates in the mid to lower  trachea, 3 cm from the carina. Transesophageal tube tip terminates in the gastric body with side port distal to the GE junction. Lungs/Pleura: Heterogeneous dependent areas of mixed ground-glass and dense consolidative opacities are present in both lungs with multiple fluid-filled dependent airways as well as interlobular septal thickening and fissural thickening and small bilateral pleural effusions with further atelectatic changes and volume loss as well. No pneumothorax. Musculoskeletal: Posterior left third through sixth left rib fractures. Posterior right rib 4 rib fractures. No other acute osseous injuries in the chest wall. Asymmetric sternoclavicular arthrosis, increased on the right. Degenerative changes in both shoulders. Multilevel degenerative changes present spine Multilevel flowing anterior osteophytosis, compatible with features of diffuse idiopathic skeletal hyperostosis (DISH). Mild body wall edema. CT ABDOMEN AND PELVIS FINDINGS Hepatobiliary: No visible hepatic injury or concerning liver lesion on this unenhanced CT. No perihepatic hematoma. Smooth surface contour. Normal attenuation. Patient is post cholecystectomy. Slight prominence of the biliary tree likely related to post cholecystectomy reservoir effect without visible calcified intraductal gallstones. Pancreas: Moderate pancreatic atrophy and partial fatty replacement of the pancreas. No ductal dilatation, discernible lesions, or inflammation. Spleen: No direct splenic injury. No perisplenic hematoma. Normal splenic size. No concerning lesions. Few scattered vascular calcifications. Adrenals/Urinary Tract: Mild lobular thickening of the adrenal gland could reflect some senescent hyperplasia without concerning nodules. No evidence of adrenal hemorrhage. No direct renal injury or perinephric hematoma. Bilateral renal atrophy with mild symmetric perinephric stranding, possibly related to senescent change or diminished renal function. Few  vascular calcifications are present in both kidneys. Collecting system calculus seen in the upper pole right kidney. No obstructive urolithiasis is evident. Hyperdense 18 mm cystic structure arising from the posterior interpolar right kidney (3/72) additional subcentimeter hyperdense cystic structure arising from the upper pole left kidney (3/65). No other concerning renal lesions. Urinary bladder  is largely decompressed by Foley catheter at the time of exam and therefore poorly evaluated by CT imaging. Further obscuration by streak artifact from a hip prosthesis. Stomach/Bowel: Transesophageal tube in appropriate position terminating in the gastric body with side port distal to the GE junction as detailed above. Mild tenting of the anterior stomach is a typical finding for placement of a transesophageal tube. Small duodenal lipoma. Small distal duodenal diverticulum (3/69). No small bowel thickening or dilatation. Mild fecalization of the distal small bowel contents can reflect some slowed intestinal transit Appendix is not visualized. No focal inflammation the vicinity of the cecum to suggest an occult appendicitis. No colonic dilatation or wall thickening. Vascular/Lymphatic: Extensive atherosclerotic calcification throughout the abdominal aorta and branch vessels. Luminal evaluation precluded in the absence of contrast media. No aneurysm or ectasia. No periaortic stranding or hemorrhage. No suspicious or enlarged lymph nodes in the included lymphatic chains. Reproductive: In prostate traversed by the Foley catheter. Coarse eccentric calcification of the prostate. No concerning abnormalities of the prostate or seminal vesicles. Nonspecific penile calcifications are noted. Other: No abdominopelvic free air or fluid. No bowel containing hernia. Small fat containing umbilical hernia. No traumatic abdominal wall dehiscence. No body wall or retroperitoneal hematoma. Musculoskeletal: The osseous structures appear  diffusely demineralized which may limit detection of small or nondisplaced fractures. No acute osseous abnormality or suspicious osseous lesion. Extensive streak artifact related to patient's right hip arthroplasty. Multilevel degenerative changes are present in the imaged portions of the spine. Additional degenerative changes in the hips and pelvis. No acute osseous abnormality of the included upper extremities. IMPRESSION: CT HEAD: 1. No acute intracranial abnormality. 2. Stable parenchymal volume loss and chronic microvascular ischemic changes. 3. Debris in the external auditory canals. CT CERVICAL SPINE: 1. No acute cervical spine fracture or traumatic listhesis. 2. Multilevel degenerative changes of the cervical spine, as described above. CT CHEST, ABDOMEN AND PELVIS 1. Unenhanced CT was performed per clinician order. Lack of IV contrast limits sensitivity and specificity, especially for evaluation of the vasculature and abdominal/pelvic solid viscera. 2. Posterior left third through sixth rib fractures. Posterior right rib 4 rib fractures. Can be seen in the setting of resuscitative efforts. No pneumothorax. 3. Heterogeneous largely dependent areas of mixed ground-glass and dense consolidation in both lungs with fluid-filled dependent airways as well as septal and fissural thickening, and small bilateral pleural effusions as well as features of volume loss. Such findings are worrisome for sequela of aspiration likely with superimposed pulmonary edema and volume loss. Mild contusive changes may also be present in the setting of resuscitation. 4. Endotracheal tube in the mid to lower trachea, 3 cm from the carina. 5. Transesophageal tube in appropriate position terminating in the gastric body with side port distal to the GE junction. 6. No other acute abdominopelvic abnormalities. 7. Nonobstructing right nephrolithiasis. 8. Hyperdense cystic structures arising from the lateral right interpolar kidney and upper  pole left kidney., possible hyperdense exophytic cyst. Recommend outpatient renal ultrasound. 9. Aortic Atherosclerosis (ICD10-I70.0). Electronically Signed   By: Lovena Le M.D.   On: 11/30/2019 02:57   CT Chest Wo Contrast  Result Date: 11/30/2019 CLINICAL DATA:  Found down with seizure-like activity, V-tach, seizure while in ED EXAM: CT HEAD WITHOUT CONTRAST CT CERVICAL SPINE WITHOUT CONTRAST CT CHEST, ABDOMEN AND PELVIS WITHOUT CONTRAST TECHNIQUE: Contiguous axial images were obtained from the base of the skull through the vertex without intravenous contrast. Multidetector CT imaging of the cervical spine was performed without intravenous contrast. Multiplanar  CT image reconstructions were also generated. Multidetector CT imaging of the chest, abdomen and pelvis was performed following the standard protocol without IV contrast. COMPARISON:  CT head 09/16/2019, MR 06/05/2018, same day chest radiograph, CT abdomen pelvis 09/17/2006 FINDINGS: CT HEAD FINDINGS Brain: No evidence of acute infarction, hemorrhage, hydrocephalus, extra-axial collection or mass lesion/mass effect. Symmetric prominence of the ventricles, cisterns and sulci compatible with parenchymal volume loss. Patchy areas of white matter hypoattenuation are most compatible with chronic microvascular angiopathy. Vascular: Atherosclerotic calcification of the carotid siphons and intradural vertebral arteries. No hyperdense vessel. Skull: No acute scalp swelling or hematoma. No calvarial fracture or other acute or suspicious osseous lesion. Chronic left occipital scalp infiltration, likely scarring. Small right frontal scalp lipoma, stable. Sinuses/Orbits: Paranasal sinuses and mastoid air cells are predominantly clear. Pneumatization of the petrous apices. Orbital structures are unremarkable aside from prior lens extractions. Other: Extensive debris in the external auditory canals. Mild bilateral TMJ arthrosis. Largely edentulous with mild  mandibular prognathism. CT CERVICAL FINDINGS Alignment: Stabilization collar is absent. Mild straightening of the upper cervical lordosis with reversal at the C4-5 levels. Mild retrolisthesis C4 on 5 and C5 on 6 is likely degenerative in nature. No evidence of traumatic listhesis. No abnormally widened, perched or jumped facets. Normal alignment of the craniocervical and atlantoaxial articulations accounting for leftward cranial rotation. Skull base and vertebrae: No acute skull base fracture. No vertebral fracture or height loss. Arthrosis present at the atlantodental interval with some mild calcific pannus formation. Multilevel spondylitic changes are detailed below. Soft tissues and spinal canal: No pre or paravertebral fluid or swelling. No visible canal hematoma. Enthesopathic changes in the nuchal ligament. Disc levels: Multilevel intervertebral disc height loss with spondylitic endplate changes. Features maximal C4-5 and C5-6 with small posterior disc osteophyte complexes resulting in at most mild canal stenosis. Uncinate spurring facet hypertrophic changes are maximal at these levels as well with moderate to severe stenosis on the left at C5-6 and more moderate changes on the right at C4-5. Additional multilevel foraminal narrowing is mild. Other: Secretions noted in the posterior naso and oropharynx above the level of the inflated endotracheal balloon. Extensive cervical carotid atherosclerosis. No concerning thyroid nodules. CT CHEST FINDINGS Cardiovascular: Limited assessment of the vasculature in the absence of contrast media. Cardiac size within normal limits. Small pericardial effusion. Extensive three-vessel coronary artery disease. Extensive calcifications upon the aortic valve leaflets. Atherosclerotic plaque within the normal caliber aorta. Shared origin of brachiocephalic and left common carotid artery origins. Calcific plaque throughout the proximal great vessels. Central pulmonary arteries are  borderline enlarged. Luminal evaluation precluded in the absence of contrast media. Mediastinum/Nodes: No mediastinal fluid or gas. Normal thyroid gland and thoracic inlet. No worrisome mediastinal or axillary adenopathy. Hilar nodal evaluation is limited in the absence of intravenous contrast media. Endotracheal tube terminates in the mid to lower trachea, 3 cm from the carina. Transesophageal tube tip terminates in the gastric body with side port distal to the GE junction. Lungs/Pleura: Heterogeneous dependent areas of mixed ground-glass and dense consolidative opacities are present in both lungs with multiple fluid-filled dependent airways as well as interlobular septal thickening and fissural thickening and small bilateral pleural effusions with further atelectatic changes and volume loss as well. No pneumothorax. Musculoskeletal: Posterior left third through sixth left rib fractures. Posterior right rib 4 rib fractures. No other acute osseous injuries in the chest wall. Asymmetric sternoclavicular arthrosis, increased on the right. Degenerative changes in both shoulders. Multilevel degenerative changes present spine Multilevel flowing  anterior osteophytosis, compatible with features of diffuse idiopathic skeletal hyperostosis (DISH). Mild body wall edema. CT ABDOMEN AND PELVIS FINDINGS Hepatobiliary: No visible hepatic injury or concerning liver lesion on this unenhanced CT. No perihepatic hematoma. Smooth surface contour. Normal attenuation. Patient is post cholecystectomy. Slight prominence of the biliary tree likely related to post cholecystectomy reservoir effect without visible calcified intraductal gallstones. Pancreas: Moderate pancreatic atrophy and partial fatty replacement of the pancreas. No ductal dilatation, discernible lesions, or inflammation. Spleen: No direct splenic injury. No perisplenic hematoma. Normal splenic size. No concerning lesions. Few scattered vascular calcifications.  Adrenals/Urinary Tract: Mild lobular thickening of the adrenal gland could reflect some senescent hyperplasia without concerning nodules. No evidence of adrenal hemorrhage. No direct renal injury or perinephric hematoma. Bilateral renal atrophy with mild symmetric perinephric stranding, possibly related to senescent change or diminished renal function. Few vascular calcifications are present in both kidneys. Collecting system calculus seen in the upper pole right kidney. No obstructive urolithiasis is evident. Hyperdense 18 mm cystic structure arising from the posterior interpolar right kidney (3/72) additional subcentimeter hyperdense cystic structure arising from the upper pole left kidney (3/65). No other concerning renal lesions. Urinary bladder is largely decompressed by Foley catheter at the time of exam and therefore poorly evaluated by CT imaging. Further obscuration by streak artifact from a hip prosthesis. Stomach/Bowel: Transesophageal tube in appropriate position terminating in the gastric body with side port distal to the GE junction as detailed above. Mild tenting of the anterior stomach is a typical finding for placement of a transesophageal tube. Small duodenal lipoma. Small distal duodenal diverticulum (3/69). No small bowel thickening or dilatation. Mild fecalization of the distal small bowel contents can reflect some slowed intestinal transit Appendix is not visualized. No focal inflammation the vicinity of the cecum to suggest an occult appendicitis. No colonic dilatation or wall thickening. Vascular/Lymphatic: Extensive atherosclerotic calcification throughout the abdominal aorta and branch vessels. Luminal evaluation precluded in the absence of contrast media. No aneurysm or ectasia. No periaortic stranding or hemorrhage. No suspicious or enlarged lymph nodes in the included lymphatic chains. Reproductive: In prostate traversed by the Foley catheter. Coarse eccentric calcification of the  prostate. No concerning abnormalities of the prostate or seminal vesicles. Nonspecific penile calcifications are noted. Other: No abdominopelvic free air or fluid. No bowel containing hernia. Small fat containing umbilical hernia. No traumatic abdominal wall dehiscence. No body wall or retroperitoneal hematoma. Musculoskeletal: The osseous structures appear diffusely demineralized which may limit detection of small or nondisplaced fractures. No acute osseous abnormality or suspicious osseous lesion. Extensive streak artifact related to patient's right hip arthroplasty. Multilevel degenerative changes are present in the imaged portions of the spine. Additional degenerative changes in the hips and pelvis. No acute osseous abnormality of the included upper extremities. IMPRESSION: CT HEAD: 1. No acute intracranial abnormality. 2. Stable parenchymal volume loss and chronic microvascular ischemic changes. 3. Debris in the external auditory canals. CT CERVICAL SPINE: 1. No acute cervical spine fracture or traumatic listhesis. 2. Multilevel degenerative changes of the cervical spine, as described above. CT CHEST, ABDOMEN AND PELVIS 1. Unenhanced CT was performed per clinician order. Lack of IV contrast limits sensitivity and specificity, especially for evaluation of the vasculature and abdominal/pelvic solid viscera. 2. Posterior left third through sixth rib fractures. Posterior right rib 4 rib fractures. Can be seen in the setting of resuscitative efforts. No pneumothorax. 3. Heterogeneous largely dependent areas of mixed ground-glass and dense consolidation in both lungs with fluid-filled dependent airways as well  as septal and fissural thickening, and small bilateral pleural effusions as well as features of volume loss. Such findings are worrisome for sequela of aspiration likely with superimposed pulmonary edema and volume loss. Mild contusive changes may also be present in the setting of resuscitation. 4. Endotracheal  tube in the mid to lower trachea, 3 cm from the carina. 5. Transesophageal tube in appropriate position terminating in the gastric body with side port distal to the GE junction. 6. No other acute abdominopelvic abnormalities. 7. Nonobstructing right nephrolithiasis. 8. Hyperdense cystic structures arising from the lateral right interpolar kidney and upper pole left kidney., possible hyperdense exophytic cyst. Recommend outpatient renal ultrasound. 9. Aortic Atherosclerosis (ICD10-I70.0). Electronically Signed   By: Lovena Le M.D.   On: 11/30/2019 02:57   CT Cervical Spine Wo Contrast  Result Date: 11/30/2019 CLINICAL DATA:  Found down with seizure-like activity, V-tach, seizure while in ED EXAM: CT HEAD WITHOUT CONTRAST CT CERVICAL SPINE WITHOUT CONTRAST CT CHEST, ABDOMEN AND PELVIS WITHOUT CONTRAST TECHNIQUE: Contiguous axial images were obtained from the base of the skull through the vertex without intravenous contrast. Multidetector CT imaging of the cervical spine was performed without intravenous contrast. Multiplanar CT image reconstructions were also generated. Multidetector CT imaging of the chest, abdomen and pelvis was performed following the standard protocol without IV contrast. COMPARISON:  CT head 09/16/2019, MR 06/05/2018, same day chest radiograph, CT abdomen pelvis 09/17/2006 FINDINGS: CT HEAD FINDINGS Brain: No evidence of acute infarction, hemorrhage, hydrocephalus, extra-axial collection or mass lesion/mass effect. Symmetric prominence of the ventricles, cisterns and sulci compatible with parenchymal volume loss. Patchy areas of white matter hypoattenuation are most compatible with chronic microvascular angiopathy. Vascular: Atherosclerotic calcification of the carotid siphons and intradural vertebral arteries. No hyperdense vessel. Skull: No acute scalp swelling or hematoma. No calvarial fracture or other acute or suspicious osseous lesion. Chronic left occipital scalp infiltration, likely  scarring. Small right frontal scalp lipoma, stable. Sinuses/Orbits: Paranasal sinuses and mastoid air cells are predominantly clear. Pneumatization of the petrous apices. Orbital structures are unremarkable aside from prior lens extractions. Other: Extensive debris in the external auditory canals. Mild bilateral TMJ arthrosis. Largely edentulous with mild mandibular prognathism. CT CERVICAL FINDINGS Alignment: Stabilization collar is absent. Mild straightening of the upper cervical lordosis with reversal at the C4-5 levels. Mild retrolisthesis C4 on 5 and C5 on 6 is likely degenerative in nature. No evidence of traumatic listhesis. No abnormally widened, perched or jumped facets. Normal alignment of the craniocervical and atlantoaxial articulations accounting for leftward cranial rotation. Skull base and vertebrae: No acute skull base fracture. No vertebral fracture or height loss. Arthrosis present at the atlantodental interval with some mild calcific pannus formation. Multilevel spondylitic changes are detailed below. Soft tissues and spinal canal: No pre or paravertebral fluid or swelling. No visible canal hematoma. Enthesopathic changes in the nuchal ligament. Disc levels: Multilevel intervertebral disc height loss with spondylitic endplate changes. Features maximal C4-5 and C5-6 with small posterior disc osteophyte complexes resulting in at most mild canal stenosis. Uncinate spurring facet hypertrophic changes are maximal at these levels as well with moderate to severe stenosis on the left at C5-6 and more moderate changes on the right at C4-5. Additional multilevel foraminal narrowing is mild. Other: Secretions noted in the posterior naso and oropharynx above the level of the inflated endotracheal balloon. Extensive cervical carotid atherosclerosis. No concerning thyroid nodules. CT CHEST FINDINGS Cardiovascular: Limited assessment of the vasculature in the absence of contrast media. Cardiac size within normal  limits.  Small pericardial effusion. Extensive three-vessel coronary artery disease. Extensive calcifications upon the aortic valve leaflets. Atherosclerotic plaque within the normal caliber aorta. Shared origin of brachiocephalic and left common carotid artery origins. Calcific plaque throughout the proximal great vessels. Central pulmonary arteries are borderline enlarged. Luminal evaluation precluded in the absence of contrast media. Mediastinum/Nodes: No mediastinal fluid or gas. Normal thyroid gland and thoracic inlet. No worrisome mediastinal or axillary adenopathy. Hilar nodal evaluation is limited in the absence of intravenous contrast media. Endotracheal tube terminates in the mid to lower trachea, 3 cm from the carina. Transesophageal tube tip terminates in the gastric body with side port distal to the GE junction. Lungs/Pleura: Heterogeneous dependent areas of mixed ground-glass and dense consolidative opacities are present in both lungs with multiple fluid-filled dependent airways as well as interlobular septal thickening and fissural thickening and small bilateral pleural effusions with further atelectatic changes and volume loss as well. No pneumothorax. Musculoskeletal: Posterior left third through sixth left rib fractures. Posterior right rib 4 rib fractures. No other acute osseous injuries in the chest wall. Asymmetric sternoclavicular arthrosis, increased on the right. Degenerative changes in both shoulders. Multilevel degenerative changes present spine Multilevel flowing anterior osteophytosis, compatible with features of diffuse idiopathic skeletal hyperostosis (DISH). Mild body wall edema. CT ABDOMEN AND PELVIS FINDINGS Hepatobiliary: No visible hepatic injury or concerning liver lesion on this unenhanced CT. No perihepatic hematoma. Smooth surface contour. Normal attenuation. Patient is post cholecystectomy. Slight prominence of the biliary tree likely related to post cholecystectomy reservoir  effect without visible calcified intraductal gallstones. Pancreas: Moderate pancreatic atrophy and partial fatty replacement of the pancreas. No ductal dilatation, discernible lesions, or inflammation. Spleen: No direct splenic injury. No perisplenic hematoma. Normal splenic size. No concerning lesions. Few scattered vascular calcifications. Adrenals/Urinary Tract: Mild lobular thickening of the adrenal gland could reflect some senescent hyperplasia without concerning nodules. No evidence of adrenal hemorrhage. No direct renal injury or perinephric hematoma. Bilateral renal atrophy with mild symmetric perinephric stranding, possibly related to senescent change or diminished renal function. Few vascular calcifications are present in both kidneys. Collecting system calculus seen in the upper pole right kidney. No obstructive urolithiasis is evident. Hyperdense 18 mm cystic structure arising from the posterior interpolar right kidney (3/72) additional subcentimeter hyperdense cystic structure arising from the upper pole left kidney (3/65). No other concerning renal lesions. Urinary bladder is largely decompressed by Foley catheter at the time of exam and therefore poorly evaluated by CT imaging. Further obscuration by streak artifact from a hip prosthesis. Stomach/Bowel: Transesophageal tube in appropriate position terminating in the gastric body with side port distal to the GE junction as detailed above. Mild tenting of the anterior stomach is a typical finding for placement of a transesophageal tube. Small duodenal lipoma. Small distal duodenal diverticulum (3/69). No small bowel thickening or dilatation. Mild fecalization of the distal small bowel contents can reflect some slowed intestinal transit Appendix is not visualized. No focal inflammation the vicinity of the cecum to suggest an occult appendicitis. No colonic dilatation or wall thickening. Vascular/Lymphatic: Extensive atherosclerotic calcification  throughout the abdominal aorta and branch vessels. Luminal evaluation precluded in the absence of contrast media. No aneurysm or ectasia. No periaortic stranding or hemorrhage. No suspicious or enlarged lymph nodes in the included lymphatic chains. Reproductive: In prostate traversed by the Foley catheter. Coarse eccentric calcification of the prostate. No concerning abnormalities of the prostate or seminal vesicles. Nonspecific penile calcifications are noted. Other: No abdominopelvic free air or fluid. No bowel containing  hernia. Small fat containing umbilical hernia. No traumatic abdominal wall dehiscence. No body wall or retroperitoneal hematoma. Musculoskeletal: The osseous structures appear diffusely demineralized which may limit detection of small or nondisplaced fractures. No acute osseous abnormality or suspicious osseous lesion. Extensive streak artifact related to patient's right hip arthroplasty. Multilevel degenerative changes are present in the imaged portions of the spine. Additional degenerative changes in the hips and pelvis. No acute osseous abnormality of the included upper extremities. IMPRESSION: CT HEAD: 1. No acute intracranial abnormality. 2. Stable parenchymal volume loss and chronic microvascular ischemic changes. 3. Debris in the external auditory canals. CT CERVICAL SPINE: 1. No acute cervical spine fracture or traumatic listhesis. 2. Multilevel degenerative changes of the cervical spine, as described above. CT CHEST, ABDOMEN AND PELVIS 1. Unenhanced CT was performed per clinician order. Lack of IV contrast limits sensitivity and specificity, especially for evaluation of the vasculature and abdominal/pelvic solid viscera. 2. Posterior left third through sixth rib fractures. Posterior right rib 4 rib fractures. Can be seen in the setting of resuscitative efforts. No pneumothorax. 3. Heterogeneous largely dependent areas of mixed ground-glass and dense consolidation in both lungs with  fluid-filled dependent airways as well as septal and fissural thickening, and small bilateral pleural effusions as well as features of volume loss. Such findings are worrisome for sequela of aspiration likely with superimposed pulmonary edema and volume loss. Mild contusive changes may also be present in the setting of resuscitation. 4. Endotracheal tube in the mid to lower trachea, 3 cm from the carina. 5. Transesophageal tube in appropriate position terminating in the gastric body with side port distal to the GE junction. 6. No other acute abdominopelvic abnormalities. 7. Nonobstructing right nephrolithiasis. 8. Hyperdense cystic structures arising from the lateral right interpolar kidney and upper pole left kidney., possible hyperdense exophytic cyst. Recommend outpatient renal ultrasound. 9. Aortic Atherosclerosis (ICD10-I70.0). Electronically Signed   By: Lovena Le M.D.   On: 11/30/2019 02:57   DG Chest Port 1 View  Result Date: 12/05/2019 CLINICAL DATA:  Acute respiratory failure EXAM: PORTABLE CHEST 1 VIEW COMPARISON:  Four days ago FINDINGS: Endotracheal tube tip is halfway between the clavicular heads and carina. The enteric tube reaches the stomach. Cardiomegaly and vascular pedicle widening. Increased hazy density on both sides, likely increasing pleural fluid. There was also airspace disease on a recent chest CT. Negative for pneumothorax. IMPRESSION: 1. Unremarkable hardware positioning. 2. Increased hazy opacity of the bilateral lower chest favoring pleural fluid. There could also be worsening of airspace disease seen on a 11/30/2019 chest CT. Electronically Signed   By: Monte Fantasia M.D.   On: 12/05/2019 07:19   Portable Chest xray  Result Date: 12/01/2019 CLINICAL DATA:  Acute respiratory failure EXAM: PORTABLE CHEST 1 VIEW COMPARISON:  Chest x-rays dated 11/30/2019 and 09/16/2019. FINDINGS: Heart size and mediastinal contours are stable. Endotracheal tube has been placed with tip  adequately positioned approximately 4 cm above the carina. Enteric tube in place. Pacer pad overlies the LEFT heart. Central pulmonary vascular congestion and probable interstitial edema. No evidence of pneumonia. No pleural effusion or pneumothorax is seen. IMPRESSION: 1. Endotracheal tube adequately positioned with tip approximately 4 cm above the carina. 2. Probable mild interstitial edema. Electronically Signed   By: Franki Cabot M.D.   On: 12/01/2019 06:05   DG Chest Portable 1 View  Result Date: 11/30/2019 CLINICAL DATA:  Shortness of breath EXAM: PORTABLE CHEST 1 VIEW COMPARISON:  09/16/2019 FINDINGS: Mild cardiomegaly. Diffuse interstitial opacity. No pleural effusion  or pneumothorax. No focal airspace consolidation. IMPRESSION: Cardiomegaly and mild pulmonary edema. Electronically Signed   By: Ulyses Jarred M.D.   On: 11/30/2019 01:55   ECHOCARDIOGRAM COMPLETE  Result Date: 11/30/2019    ECHOCARDIOGRAM REPORT   Patient Name:   [name] Date of Exam: 11/30/2019 Medical Rec #:  [id number]             Height:       67.0 in Accession #:    [id number]            Weight:       179.5 lb Date of Birth:  1935-05-24            BSA:          1.931 m Patient Age:    58 years              BP:           120/64 mmHg Patient Gender: M                     HR:           63 bpm. Exam Location:  [facility] Procedure: 2D Echo, Cardiac Doppler and Color Doppler Indications:     Abnormal ECG 794.31  History:         Patient has prior history of Echocardiogram examinations, most                  recent 06/25/2019. CHF, Arrythmias:Atrial Fibrillation; Risk                  Factors:Hypertension and Diabetes. PVD.  Sonographer:     Sherrie Sport RDCS (AE) Referring Phys:  [id number] Bradly Bienenstock Diagnosing Phys: Ida Rogue MD  Sonographer Comments: Technically difficult study due to poor echo windows, no apical window, no parasternal window and echo performed with patient supine and on artificial respirator. IMPRESSIONS   1. Left ventricular ejection fraction, by estimation, is 50 to 55%. The left ventricle has low normal function. Unable to exclude regional wall motion abnormality.There is mild left ventricular hypertrophy. Left ventricular diastolic parameters are indeterminate.  2. Right ventricular systolic function was not well visualized. The right ventricular size is normal.  3. Challenging image quality FINDINGS  Left Ventricle: Left ventricular ejection fraction, by estimation, is 50 to 55%. The left ventricle has low normal function. The left ventricle has no regional wall motion abnormalities. The left ventricular internal cavity size was normal in size. There is mild left ventricular hypertrophy. Left ventricular diastolic parameters are indeterminate. Right Ventricle: The right ventricular size is normal. No increase in right ventricular wall thickness. Right ventricular systolic function was not well visualized. Left Atrium: Left atrial size was not well visualized. Right Atrium: Right atrial size was not well visualized. Pericardium: There is no evidence of pericardial effusion. Mitral Valve: The mitral valve was not well visualized. Normal mobility of the mitral valve leaflets. No evidence of mitral valve regurgitation. No evidence of mitral valve stenosis. Tricuspid Valve: The tricuspid valve is not well visualized. Tricuspid valve regurgitation is not demonstrated. No evidence of tricuspid stenosis. Aortic Valve: The aortic valve was not well visualized. Aortic valve regurgitation is not visualized. No aortic stenosis is present. Pulmonic Valve: The pulmonic valve was not well visualized. Pulmonic valve regurgitation is not visualized. No evidence of pulmonic stenosis. Aorta: The aortic root was not well visualized and the ascending aorta was not well visualized. Venous: The pulmonary  veins were not well visualized. The inferior vena cava is normal in size with greater than 50% respiratory variability, suggesting  right atrial pressure of 3 mmHg. IAS/Shunts: No atrial level shunt detected by color flow Doppler.  LEFT VENTRICLE PLAX 2D LVIDd:         4.95 cm LVIDs:         3.32 cm LV PW:         1.61 cm LV IVS:        1.49 cm  LEFT ATRIUM         Index LA diam:    4.80 cm 2.49 cm/m  PULMONIC VALVE PV Vmax:        0.86 m/s PV Peak grad:   3.0 mmHg RVOT Peak grad: 4 mmHg  Ida Rogue MD Electronically signed by Ida Rogue MD Signature Date/Time: 11/30/2019/6:57:51 PM    Final        Subjective: Pt denies any complaints but wants to see his wife   Discharge Exam: Vitals:   12/11/19 0633 12/11/19 0849  BP: (!) 141/69 (!) 141/74  Pulse: 69   Resp: 15   Temp: 98.3 F (36.8 C) 98.8 F (37.1 C)  SpO2: (!) 88% 98%   Vitals:   12/10/19 1934 12/11/19 0001 12/11/19 0633 12/11/19 0849  BP: (!) 162/97 (!) 190/80 (!) 141/69 (!) 141/74  Pulse: 74 78 69   Resp: 17 14 15    Temp: 98.1 F (36.7 C) 98.3 F (36.8 C) 98.3 F (36.8 C) 98.8 F (37.1 C)  TempSrc: Oral   Oral  SpO2: 96% 91% (!) 88% 98%  Weight:      Height:        General: Pt is alert, awake, not in acute distress Cardiovascular: S1/S2 +, no rubs, no gallops Respiratory: decreased breath sounds b/l, no wheezing, no rhonchi Abdominal: Soft, NT, ND, bowel sounds + Extremities:  no cyanosis    The results of significant diagnostics from this hospitalization (including imaging, microbiology, ancillary and laboratory) are listed below for reference.     Microbiology: No results found for this or any previous visit (from the past 240 hour(s)).   Labs: BNP (last 3 results) Recent Labs    06/24/19 1346 09/16/19 0649 11/30/19 0109  BNP 887.0* 734.0* 098.1*   Basic Metabolic Panel: Recent Labs  Lab 12/05/19 0430 12/05/19 0430 12/06/19 0347 12/06/19 0347 12/07/19 0347 12/08/19 0327 12/09/19 0343 12/10/19 0527 12/11/19 0519  NA 141   < > 139   < > 143 141 140 138 141  K 3.7   < > 4.9   < > 3.7 3.5 3.3* 3.4* 3.5  CL 109    < > 107   < > 109 106 107 105 103  CO2 24   < > 23   < > 24 25 28 27 28   GLUCOSE 120*   < > 165*   < > 120* 104* 104* 149* 79  BUN 29*   < > 34*   < > 39* 38* 27* 20 17  CREATININE 1.30*   < > 1.56*   < > 1.59* 1.56* 1.26* 1.08 1.24  CALCIUM 7.9*   < > 8.4*   < > 8.4* 8.3* 8.1* 8.3* 8.8*  MG 2.1  --  2.1  --  2.2  --   --   --   --   PHOS 2.8  --  4.9*  --   --   --   --   --   --    < > =  values in this interval not displayed.   Liver Function Tests: No results for input(s): AST, ALT, ALKPHOS, BILITOT, PROT, ALBUMIN in the last 168 hours. No results for input(s): LIPASE, AMYLASE in the last 168 hours. No results for input(s): AMMONIA in the last 168 hours. CBC: Recent Labs  Lab 12/05/19 0430 12/05/19 0430 12/06/19 0347 12/08/19 0327 12/09/19 0343 12/10/19 0527 12/11/19 0519  WBC 10.8*   < > 8.4 8.4 10.8* 11.0* 12.6*  NEUTROABS 8.1*  --   --   --   --   --   --   HGB 11.0*   < > 12.8* 11.9* 12.1* 12.3* 13.6  HCT 31.7*   < > 37.2* 34.7* 36.9* 36.6* 39.5  MCV 85.2   < > 86.1 86.8 89.1 87.6 87.2  PLT 209   < > 278 281 300 310 351   < > = values in this interval not displayed.   Cardiac Enzymes: No results for input(s): CKTOTAL, CKMB, CKMBINDEX, TROPONINI in the last 168 hours. BNP: Invalid input(s): POCBNP CBG: Recent Labs  Lab 12/10/19 1938 12/10/19 2334 12/11/19 0420 12/11/19 0759 12/11/19 0837  GLUCAP 137* 127* 80 67* 128*   D-Dimer No results for input(s): DDIMER in the last 72 hours. Hgb A1c No results for input(s): HGBA1C in the last 72 hours. Lipid Profile No results for input(s): CHOL, HDL, LDLCALC, TRIG, CHOLHDL, LDLDIRECT in the last 72 hours. Thyroid function studies No results for input(s): TSH, T4TOTAL, T3FREE, THYROIDAB in the last 72 hours.  Invalid input(s): FREET3 Anemia work up No results for input(s): VITAMINB12, FOLATE, FERRITIN, TIBC, IRON, RETICCTPCT in the last 72 hours. Urinalysis    Component Value Date/Time   COLORURINE STRAW (A)  11/30/2019 0228   APPEARANCEUR CLEAR (A) 11/30/2019 0228   LABSPEC 1.025 11/30/2019 0228   PHURINE 5.0 11/30/2019 0228   GLUCOSEU >=500 (A) 11/30/2019 0228   HGBUR NEGATIVE 11/30/2019 0228   BILIRUBINUR NEGATIVE 11/30/2019 0228   KETONESUR NEGATIVE 11/30/2019 0228   PROTEINUR 100 (A) 11/30/2019 0228   NITRITE NEGATIVE 11/30/2019 0228   LEUKOCYTESUR NEGATIVE 11/30/2019 0228   Sepsis Labs Invalid input(s): PROCALCITONIN,  WBC,  LACTICIDVEN Microbiology No results found for this or any previous visit (from the past 240 hour(s)).   Time coordinating discharge: Over 30 minutes  SIGNED:   Wyvonnia Dusky, MD  [facility] 12/11/2019, 11:50 AM Pager   If 7PM-7AM, please contact night-coverage [email or web address]

## 2019-12-18 ENCOUNTER — Ambulatory Visit (INDEPENDENT_AMBULATORY_CARE_PROVIDER_SITE_OTHER): Payer: Medicare Other | Admitting: Vascular Surgery

## 2019-12-21 ENCOUNTER — Ambulatory Visit (INDEPENDENT_AMBULATORY_CARE_PROVIDER_SITE_OTHER): Payer: Medicare Other | Admitting: Vascular Surgery

## 2019-12-25 IMAGING — DX DG CHEST 1V PORT
1 series · 1 of 1 positions shown · non-contrast
Comparison: 12/21/2018

CLINICAL DATA: Respiratory distress

EXAM:
PORTABLE CHEST 1 VIEW

[chest ap]
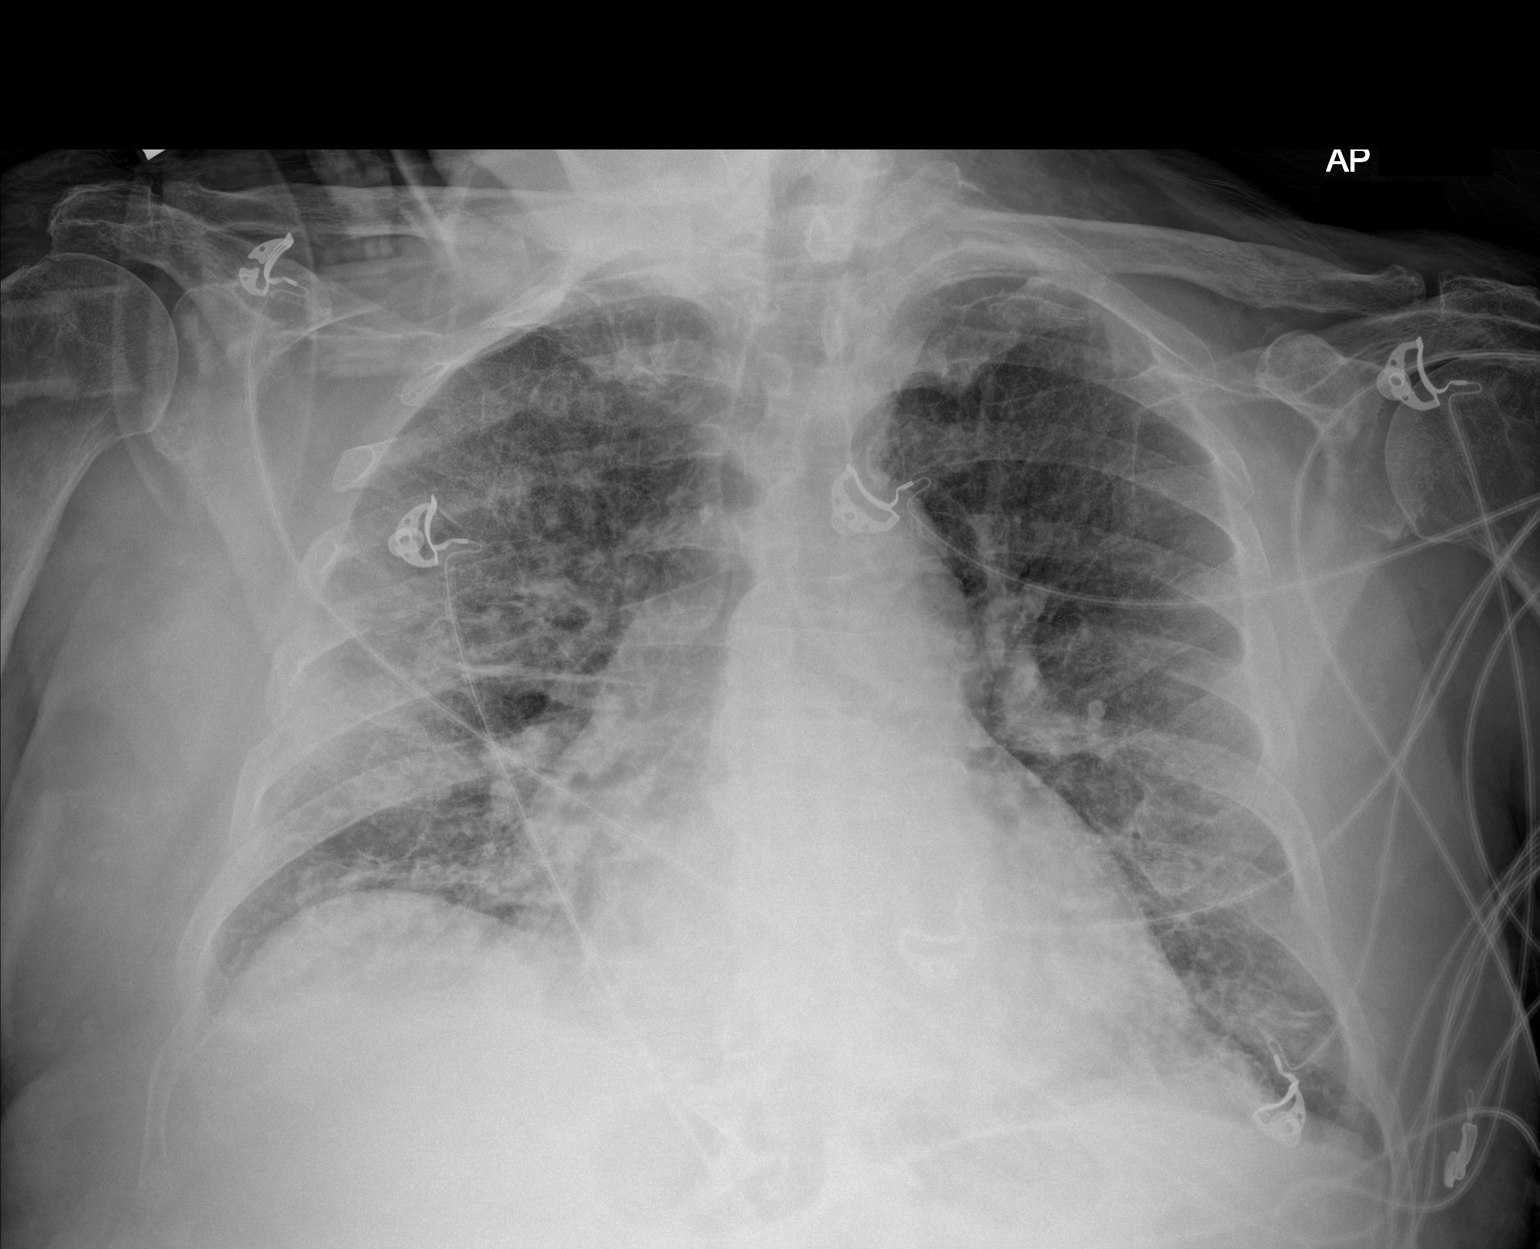

[1 of 1 positions shown; findings below may reference images not displayed]

FINDINGS: Chronic cardiomegaly and interstitial prominence with mild scarring
at the right base. New asymmetric hazy airspace type density on the
right. No effusion or pneumothorax. Stable borderline heart size.

Artifact from overlapping hardware.
IMPRESSION: Hazy asymmetric right-sided opacity primarily suspicious for
pneumonia.

Background chronic bronchitic markings.

## 2020-01-29 ENCOUNTER — Other Ambulatory Visit: Payer: Self-pay

## 2020-01-29 ENCOUNTER — Ambulatory Visit (INDEPENDENT_AMBULATORY_CARE_PROVIDER_SITE_OTHER): Payer: Medicare Other | Admitting: Vascular Surgery

## 2020-01-29 ENCOUNTER — Encounter (INDEPENDENT_AMBULATORY_CARE_PROVIDER_SITE_OTHER): Payer: Self-pay | Admitting: Vascular Surgery

## 2020-01-29 VITALS — BP 133/74 | HR 44 | Ht 67.0 in | Wt 180.0 lb

## 2020-01-29 DIAGNOSIS — I10 Essential (primary) hypertension: Secondary | ICD-10-CM | POA: Diagnosis not present

## 2020-01-29 DIAGNOSIS — Z794 Long term (current) use of insulin: Secondary | ICD-10-CM

## 2020-01-29 DIAGNOSIS — I480 Paroxysmal atrial fibrillation: Secondary | ICD-10-CM

## 2020-01-29 DIAGNOSIS — S88111A Complete traumatic amputation at level between knee and ankle, right lower leg, initial encounter: Secondary | ICD-10-CM | POA: Diagnosis not present

## 2020-01-29 DIAGNOSIS — E119 Type 2 diabetes mellitus without complications: Secondary | ICD-10-CM | POA: Diagnosis not present

## 2020-01-29 NOTE — Assessment & Plan Note (Signed)
On anticoagulation 

## 2020-01-29 NOTE — Progress Notes (Signed)
MRN : [id number]  [name] is a 85 y.o. (March 26, 1935) male who presents with chief complaint of  Chief Complaint  Patient presents with  . Follow-up    1 mo no studies  .  History of Present Illness: Patient returns today in follow up of his PAD. He is now s/p bilateral BKA and his right stump has healed after dealing with a chronic wound.  [facility] is currently at [facility] getting [facility] therapy for 2 more weeks and he will go home.  He will definitely benefit from some home physical therapy once he is discharged.  No new issues or problems.  His stump remains well-healed today.  Current Outpatient Medications  Medication Sig Dispense Refill  . acetaminophen (TYLENOL) 325 MG tablet Take 650 mg by mouth every 6 (six) hours as needed.    Marland Kitchen amiodarone (PACERONE) 200 MG tablet Take 200 mg by mouth daily.    Marland Kitchen aspirin EC 81 MG tablet Take 1 tablet (81 mg total) by mouth daily. 150 tablet 2  . atorvastatin (LIPITOR) 80 MG tablet Take 80 mg by mouth every evening.    . bisacodyl (DULCOLAX) 5 MG EC tablet Take 1 tablet (5 mg total) by mouth daily as needed for moderate constipation. 30 tablet 0  . chlorthalidone (HYGROTON) 25 MG tablet Take 25 mg by mouth daily.    . Cholecalciferol (VITAMIN D-3) 25 MCG (1000 UT) CAPS Take 1,000 Units by mouth daily at 6 (six) AM.     . divalproex (DEPAKOTE ER) 500 MG 24 hr tablet Take 1 tablet (500 mg total) by mouth daily.    . Ensure Max Protein (ENSURE MAX PROTEIN) LIQD Take 330 mLs (11 oz total) by mouth 2 (two) times daily.    . ferrous sulfate 325 (65 FE) MG tablet Take 1 tablet (325 mg total) by mouth daily with breakfast. 30 tablet 3  . gabapentin (NEURONTIN) 300 MG capsule Take 300 mg by mouth 3 (three) times daily.     . insulin glargine (LANTUS) 100 UNIT/ML injection Inject 0.15 mLs (15 Units total) into the skin at bedtime. 1 vial 0  . insulin lispro (HUMALOG) 100 UNIT/ML injection Inject 0.03 mLs (3 Units total) into the skin 3  (three) times daily with meals. (Patient taking differently: Inject 3 Units into the skin 3 (three) times daily with meals. Plus 1 additional unit for every 50 BG points over 200 (max 5 additional units per dose)) 10 mL 0  . isosorbide mononitrate (IMDUR) 30 MG 24 hr tablet Take 60 mg by mouth daily.    . metFORMIN (GLUCOPHAGE) 500 MG tablet Take 500 mg by mouth daily with breakfast.     . metoprolol tartrate (LOPRESSOR) 25 MG tablet Take 25 mg by mouth 3 (three) times daily.    . Multiple Vitamin (MULTIVITAMIN WITH MINERALS) TABS tablet Take 1 tablet by mouth daily.    . nitroGLYCERIN (NITROSTAT) 0.4 MG SL tablet Place 1 tablet (0.4 mg total) under the tongue every 5 (five) minutes as needed for chest pain. 20 tablet 0  . potassium chloride (KLOR-CON) 20 MEQ packet Take by mouth 2 (two) times daily.    . Rivaroxaban (XARELTO) 15 MG TABS tablet Take 1 tablet (15 mg total) by mouth daily with supper. Hold Xarelto for 3 days.  Start on January 09, 2019. 30 tablet 1  . senna (SENOKOT) 8.6 MG TABS tablet Take 1 tablet (8.6 mg total) by mouth daily as needed for mild constipation. [place]  tablet 0  . TIADYLT ER 120 MG 24 hr capsule Take 120 mg by mouth daily.    Marland Kitchen zinc sulfate 220 (50 Zn) MG capsule Take 220 mg by mouth daily.     Current Facility-Administered Medications  Medication Dose Route Frequency Provider Last Rate Last Admin  . diphenhydrAMINE (BENADRYL) injection 50 mg  50 mg Intravenous Once PRN Stegmayer, Kimberly A, PA-C      . famotidine (PEPCID) tablet 40 mg  40 mg Oral Once PRN Stegmayer, Abbe [place]        Past Medical History:  Diagnosis Date  . Arthritis   . Atrial fibrillation ([place])   . Carcinoma of prostate ([place])   . CHF (congestive heart failure) ([place])   . Chronic kidney disease   . Diabetes mellitus without complication ([place])   . ED (erectile dysfunction)   . Frequent urination   . Hyperlipidemia   . Hypertension   . Moderate mitral insufficiency   . Peripheral  vascular disease ([place])   . Pneumonia 04/2016  . Prostate cancer ([place])   . Sleep apnea    OSA--USE C-PAP  . Ulcer of left foot due to type 2 diabetes mellitus ([place])   . Urinary stress incontinence, male     Past Surgical History:  Procedure Laterality Date  . AMPUTATION Left 01/12/2017   Procedure: AMPUTATION BELOW KNEE;  Surgeon: Algernon Huxley, MD;  Location: [facility] ORS;  Service: General;  Laterality: Left;  . AMPUTATION Right 12/27/2018   Procedure: AMPUTATION BELOW KNEE;  Surgeon: Algernon Huxley, MD;  Location: [facility];  Service: General;  Laterality: Right;  . APLIGRAFT PLACEMENT Left 10/15/2016   Procedure: APLIGRAFT PLACEMENT;  Surgeon: Albertine Patricia, DPM;  Location: [facility];  Service: Podiatry;  Laterality: Left;  necrotic ulcer  . CHOLECYSTECTOMY    . COLONOSCOPY WITH PROPOFOL N/A 01/02/2019   Procedure: COLONOSCOPY WITH PROPOFOL;  Surgeon: Lucilla Lame, MD;  Location: [facility]COPY;  Service: Endoscopy;  Laterality: N/A;  . COLONOSCOPY WITH PROPOFOL N/A 01/05/2019   Procedure: COLONOSCOPY WITH PROPOFOL;  Surgeon: Lucilla Lame, MD;  Location: [facility] ENDOSCOPY;  Service: Endoscopy;  Laterality: N/A;  . ESOPHAGOGASTRODUODENOSCOPY (EGD) WITH PROPOFOL N/A 01/02/2019   Procedure: ESOPHAGOGASTRODUODENOSCOPY (EGD) WITH PROPOFOL;  Surgeon: Lucilla Lame, MD;  Location: [facility] ENDOSCOPY;  Service: Endoscopy;  Laterality: N/A;  . EYE SURGERY Bilateral    Cataract Extraction with IOL  . HIP ARTHROPLASTY Right 10/13/2018   Procedure: ARTHROPLASTY BIPOLAR HIP (HEMIARTHROPLASTY);  Surgeon: Earnestine Leys, MD;  Location: [facility];  Service: Orthopedics;  Laterality: Right;  . IRRIGATION AND DEBRIDEMENT FOOT Left 10/15/2016   Procedure: IRRIGATION AND DEBRIDEMENT FOOT-EXCISIONAL DEBRIDEMENT OF SKIN 3RD, 4TH AND 5TH TOES WITH APPLICATION OF APLIGRAFT;  Surgeon: Albertine Patricia, DPM;  Location: [facility];  Service: Podiatry;  Laterality: Left;  necrotic,gangrene  . IRRIGATION AND DEBRIDEMENT FOOT Left  12/09/2016   Procedure: IRRIGATION AND DEBRIDEMENT FOOT-MUSCLE/FASCIA, RESECTION OF FOURTH AND FIFTH METATARSAL NECROTIC BONE;  Surgeon: Albertine Patricia, DPM;  Location: [facility];  Service: Podiatry;  Laterality: Left;  . IRRIGATION AND DEBRIDEMENT FOOT Right 12/23/2018   Procedure: IRRIGATION AND DEBRIDEMENT FOOT and wound vac application;  Surgeon: Samara Deist, DPM;  Location: [facility];  Service: Podiatry;  Laterality: Right;  . LEFT HEART CATH N/A 06/25/2019   Procedure: Left Heart Cath and Coronary Angiography;  Surgeon: Isaias Cowman, MD;  Location: [place] [facility] LAB;  Service: Cardiovascular;  Laterality: N/A;  . LEFT HEART CATH AND CORONARY ANGIOGRAPHY N/A 06/05/2018  Procedure: LEFT HEART CATH AND CORONARY ANGIOGRAPHY;  Surgeon: Yolonda Kida, MD;  Location: [place] [facility];  Service: Cardiovascular;  Laterality: N/A;  . LOWER EXTREMITY ANGIOGRAPHY Left 08/16/2016   Procedure: Lower Extremity Angiography;  Surgeon: Algernon Huxley, MD;  Location: [place] [facility];  Service: Cardiovascular;  Laterality: Left;  . LOWER EXTREMITY ANGIOGRAPHY Left 09/01/2016   Procedure: Lower Extremity Angiography;  Surgeon: Algernon Huxley, MD;  Location: [place] [facility];  Service: Cardiovascular;  Laterality: Left;  . LOWER EXTREMITY ANGIOGRAPHY Left 10/14/2016   Procedure: Lower Extremity Angiography;  Surgeon: Algernon Huxley, MD;  Location: [place] [facility];  Service: Cardiovascular;  Laterality: Left;  . LOWER EXTREMITY ANGIOGRAPHY Left 12/20/2016   Procedure: Lower Extremity Angiography;  Surgeon: Algernon Huxley, MD;  Location: [place] [facility];  Service: Cardiovascular;  Laterality: Left;  . LOWER EXTREMITY ANGIOGRAPHY Right 12/18/2018   Procedure: LOWER EXTREMITY ANGIOGRAPHY;  Surgeon: Algernon Huxley, MD;  Location: [place] [facility];  Service: Cardiovascular;  Laterality: Right;  . LOWER EXTREMITY INTERVENTION  09/01/2016   Procedure: Lower Extremity Intervention;  Surgeon: Algernon Huxley, MD;  Location: [place] [facility];  Service: Cardiovascular;;  . PROSTATE SURGERY     removal  . TONSILLECTOMY     as a child    Social History        Tobacco Use  . Smoking status: Former Smoker    Packs/day: 0.25    Types: Cigarettes    Quit date: 10/14/1994    Years since quitting: 24.7  . Smokeless tobacco: Never Used  Substance Use Topics  . Alcohol use: No    Alcohol/week: 0.0 standard drinks  . Drug use: No         Family History  Problem Relation Age of Onset  . Hypertension Mother   . Diabetes Mother   . Prostate cancer Neg Hx   . Bladder Cancer Neg Hx   . Kidney disease Neg Hx           Allergies  Allergen Reactions  . Contrast Media [Iodinated Diagnostic Agents] Shortness Of Breath    SOB  . Iohexol Shortness Of Breath    Desc: Respiratory Distress, Laryngedema, diaphoresis, Onset Date: [id number]   . Metrizamide Shortness Of Breath    SOB SOB SOB   . Latex Rash     REVIEW OF SYSTEMS(Negative unless checked) Constitutional: [] ?????Weight loss[] ?????Fever[] ?????Chills Cardiac:[] ?????Chest pain[] ?????Atrial Fibrillation[] ?????Palpitations [] ?????Shortness of breath when laying flat [] ?????Shortness of breath with exertion. [] ?????Shortness of breath at rest Vascular: [] ?????Pain in legs with walking[] ?????Pain in legswith standing[] ?????Pain in legs when laying flat [] ?????Claudication [] ?????Pain in feet when laying flat [] ?????History of DVT [] ?????Phlebitis [] ?????Swelling in legs [] ?????Varicose veins [] ?????Non-healing ulcers Pulmonary: [] ?????Uses home oxygen [] ?????Productive cough[] ?????Hemoptysis [] ?????Wheeze [] ?????COPD [] ?????Asthma Neurologic: [] ?????Dizziness[] ?????Seizures [] ?????Blackouts[] ?????History of stroke [] ?????History of TIA[] ?????Aphasia [] ?????Temporary Blindness[] ?????Weaknessor numbness in arm [] ?????Weakness  or numbnessin leg Musculoskeletal:[] ?????Joint swelling [] ?????Joint pain [] ?????Low back pain [] ?????History of Knee Replacement [x] ?????Arthritis [] ?????back Surgeries[] ?????Spinal Stenosis  Hematologic:[] ?????Easy bruising[] ?????Easy bleeding [] ?????Hypercoagulable state [x] ?????Anemic Gastrointestinal:[] ?????Diarrhea [] ?????Vomiting[] ?????Gastroesophageal reflux/heartburn[] ?????Difficulty swallowing. [] ?????Abdominal pain Genitourinary: [x] ?????Chronic kidney disease [] ?????Difficulturination [] ?????Anuric[] ?????Blood in urine [] ?????Frequenturination [] ?????Burning with urination[] ?????Hematuria Skin: [] ?????Rashes [] ?????Ulcers [] ?????Wounds Psychological: [] ?????History of anxiety[] ?????History of major depression [x] ?????Memory Difficulties    Physical Examination  BP 133/74   Pulse (!) 44   Ht 5\' 7"  (1.702 m)   Wt 180 lb (81.6 kg)   BMI 28.19 kg/m  Gen:  WD/WN, NAD Head: [place]/AT, No temporalis wasting. Ear/Nose/Throat: Hearing grossly intact, nares w/o erythema or  drainage Eyes: Conjunctiva clear. Sclera non-icteric Neck: Supple.  Trachea midline Pulmonary:  Good air movement, no use of accessory muscles.  Cardiac: irregular Vascular:  Vessel Right Left  Radial Palpable Palpable           Musculoskeletal: M/S 5/5 throughout.  No deformity or atrophy. Bilateral BKA well healed incisions. Prosthesis in place Neurologic: Sensation grossly intact in extremities.  Symmetrical.  Speech is fluent.  Psychiatric: Judgment intact, Mood & affect appropriate for pt's clinical situation. Dermatologic: No rashes or ulcers noted.  No cellulitis or open wounds.       Labs Recent Results (from the past 2160 hour(s))  Glucose, capillary     Status: Abnormal   Collection Time: 11/30/19  1:06 AM  Result Value Ref Range   Glucose-Capillary >600 (HH) 70 - 99 mg/dL    Comment: Glucose reference range applies only to samples taken  after fasting for at least 8 hours.  CBC with Differential     Status: Abnormal   Collection Time: 11/30/19  1:09 AM  Result Value Ref Range   WBC 21.8 (H) 4.0 - 10.5 K/uL   RBC 6.02 (H) 4.22 - 5.81 MIL/uL   Hemoglobin 17.6 (H) 13.0 - 17.0 g/dL   HCT 52.3 (H) 39 - 52 %   MCV 86.9 80.0 - 100.0 fL   MCH 29.2 26.0 - 34.0 pg   MCHC 33.7 30.0 - 36.0 g/dL   RDW 13.5 11.5 - 15.5 %   Platelets 241 150 - 400 K/uL   nRBC 0.0 0.0 - 0.2 %   Neutrophils Relative % 70 %   Neutro Abs 15.1 (H) 1.7 - 7.7 K/uL   Lymphocytes Relative 20 %   Lymphs Abs 4.4 (H) 0.7 - 4.0 K/uL   Monocytes Relative 6 %   Monocytes Absolute 1.4 (H) 0 - 1 K/uL   Eosinophils Relative 2 %   Eosinophils Absolute 0.5 0 - 0 K/uL   Basophils Relative 1 %   Basophils Absolute 0.2 (H) 0 - 0 K/uL   Immature Granulocytes 1 %   Abs Immature Granulocytes 0.27 (H) 0.00 - 0.07 K/uL    Comment: Performed at [facility], [address] [place], [place] [address]  Basic metabolic panel     Status: Abnormal   Collection Time: 11/30/19  1:09 AM  Result Value Ref Range   Sodium 137 135 - 145 mmol/L   Potassium 4.0 3.5 - 5.1 mmol/L    Comment: HEMOLYSIS AT THIS LEVEL MAY AFFECT RESULT   Chloride 99 98 - 111 mmol/L   CO2 22 22 - 32 mmol/L   Glucose, Bld 679 (HH) 70 - 99 mg/dL    Comment: CRITICAL RESULT CALLED TO, READ BACK BY AND VERIFIED WITH GRACIE WINEMYN ON 11/30/19 AT 0146 [facility] Glucose reference range applies only to samples taken after fasting for at least 8 hours.    BUN 28 (H) 8 - 23 mg/dL   Creatinine, Ser 1.72 (H) 0.61 - 1.24 mg/dL   Calcium 10.5 (H) 8.9 - 10.3 mg/dL   GFR calc non Af Amer 36 (L) >60 mL/min   GFR calc Af Amer 42 (L) >60 mL/min   Anion gap 16 (H) 5 - 15    Comment: Performed at [facility], [address] [place],  [address]  Troponin I (High Sensitivity)     Status: Abnormal   Collection Time: 11/30/19  1:09 AM  Result Value Ref Range   Troponin I (High Sensitivity) 39 (H)  <  18 ng/L    Comment: (NOTE) Elevated high sensitivity troponin I (hsTnI) values and significant  changes across serial measurements may suggest ACS but many other  chronic and acute conditions are known to elevate hsTnI results.  Refer to the "Links" section for chest pain algorithms and additional  guidance. Performed at [facility], [address] [place], [place] [address]   Brain natriuretic peptide     Status: Abnormal   Collection Time: 11/30/19  1:09 AM  Result Value Ref Range   B Natriuretic Peptide 390.9 (H) 0.0 - 100.0 pg/mL    Comment: Performed at [facility], [address] [place], [place] [address]  Hepatic function panel     Status: Abnormal   Collection Time: 11/30/19  1:09 AM  Result Value Ref Range   Total Protein 7.9 6.5 - 8.1 g/dL   Albumin 3.5 3.5 - 5.0 g/dL   AST 43 (H) 15 - 41 U/L    Comment: HEMOLYSIS AT THIS LEVEL MAY AFFECT RESULT   ALT 28 0 - 44 U/L    Comment: HEMOLYSIS AT THIS LEVEL MAY AFFECT RESULT   Alkaline Phosphatase 172 (H) 38 - 126 U/L   Total Bilirubin 1.3 (H) 0.3 - 1.2 mg/dL    Comment: HEMOLYSIS AT THIS LEVEL MAY AFFECT RESULT   Bilirubin, Direct 0.5 (H) 0.0 - 0.2 mg/dL    Comment: HEMOLYSIS AT THIS LEVEL MAY AFFECT RESULT   Indirect Bilirubin 0.8 0.3 - 0.9 mg/dL    Comment: Performed at [facility], [address] [place], [place] [address]  Magnesium     Status: None   Collection Time: 11/30/19  1:09 AM  Result Value Ref Range   Magnesium 2.0 1.7 - 2.4 mg/dL    Comment: Performed at [facility], [address] [place], [place] [address]  Protime-INR     Status: None   Collection Time: 11/30/19  1:09 AM  Result Value Ref Range   Prothrombin Time 13.9 11.4 - 15.2 seconds   INR 1.1 0.8 - 1.2    Comment: (NOTE) INR goal varies based on device and disease states. Performed at [facility], [address] [place], [place] [address]   APTT     Status: None   Collection Time:  11/30/19  1:09 AM  Result Value Ref Range   aPTT 25 24 - 36 seconds    Comment: Performed at [facility], [address] [place], [place] [address]  Lactic acid, plasma     Status: Abnormal   Collection Time: 11/30/19  1:09 AM  Result Value Ref Range   Lactic Acid, Venous 7.1 (HH) 0.5 - 1.9 mmol/L    Comment: CRITICAL RESULT CALLED TO, READ BACK BY AND VERIFIED WITH Terri Piedra [facility] @ 4696 11/30/2019 RH Performed at [facility], [address] [place], [place] [address]   Urine Drug Screen, Qualitative ([place] only)     Status: Abnormal   Collection Time: 11/30/19  1:09 AM  Result Value Ref Range   Tricyclic, Ur Screen NONE DETECTED NONE DETECTED   Amphetamines, Ur Screen NONE DETECTED NONE DETECTED   MDMA (Ecstasy)Ur Screen NONE DETECTED NONE DETECTED   Cocaine Metabolite,Ur [place] NONE DETECTED NONE DETECTED   Opiate, Ur Screen NONE DETECTED NONE DETECTED   Phencyclidine (PCP) Ur S NONE DETECTED NONE DETECTED   Cannabinoid 50 Ng, Ur [place] NONE DETECTED NONE DETECTED   Barbiturates, Ur Screen NONE DETECTED NONE DETECTED   Benzodiazepine, Ur Scrn POSITIVE (  A) NONE DETECTED   Methadone Scn, Ur NONE DETECTED NONE DETECTED    Comment: (NOTE) Tricyclics + metabolites, urine    Cutoff 1000 ng/mL Amphetamines + metabolites, urine  Cutoff 1000 ng/mL MDMA (Ecstasy), urine              Cutoff 500 ng/mL Cocaine Metabolite, urine          Cutoff 300 ng/mL Opiate + metabolites, urine        Cutoff 300 ng/mL Phencyclidine (PCP), urine         Cutoff 25 ng/mL Cannabinoid, urine                 Cutoff 50 ng/mL Barbiturates + metabolites, urine  Cutoff 200 ng/mL Benzodiazepine, urine              Cutoff 200 ng/mL Methadone, urine                   Cutoff 300 ng/mL  The urine drug screen provides only a preliminary, unconfirmed analytical test result and should not be used for non-medical purposes. Clinical consideration and professional judgment should be applied to any positive  drug screen result due to possible interfering substances. A more specific alternate chemical method must be used in order to obtain a confirmed analytical result. Gas chromatography / mass spectrometry (GC/MS) is the preferred confirm atory method. Performed at [facility], [address] [place], [place] [address]   Blood gas, venous     Status: Abnormal   Collection Time: 11/30/19  1:27 AM  Result Value Ref Range   pH, Ven 7.31 7.25 - 7.43   pCO2, Ven 48 44 - 60 mmHg   pO2, Ven 42.0 32 - 45 mmHg   Bicarbonate 24.2 20.0 - 28.0 mmol/L   Acid-base deficit 2.7 (H) 0.0 - 2.0 mmol/L   O2 Saturation 72.1 %   Patient temperature 37.0    Collection site VEIN    Sample type VENOUS     Comment: Performed at [name], [address] [place], [place] [address]  Glucose, capillary     Status: Abnormal   Collection Time: 11/30/19  1:58 AM  Result Value Ref Range   Glucose-Capillary >600 (HH) 70 - 99 mg/dL    Comment: Glucose reference range applies only to samples taken after fasting for at least 8 hours.  Glucose, capillary     Status: Abnormal   Collection Time: 11/30/19  2:26 AM  Result Value Ref Range   Glucose-Capillary >600 (HH) 70 - 99 mg/dL    Comment: Glucose reference range applies only to samples taken after fasting for at least 8 hours.  Urinalysis, Complete w Microscopic     Status: Abnormal   Collection Time: 11/30/19  2:28 AM  Result Value Ref Range   Color, Urine STRAW (A) YELLOW   APPearance CLEAR (A) CLEAR   Specific Gravity, Urine 1.025 1.005 - 1.030   pH 5.0 5.0 - 8.0   Glucose, UA >=500 (A) NEGATIVE mg/dL   Hgb urine dipstick NEGATIVE NEGATIVE   Bilirubin Urine NEGATIVE NEGATIVE   Ketones, ur NEGATIVE NEGATIVE mg/dL   Protein, ur 100 (A) NEGATIVE mg/dL   Nitrite NEGATIVE NEGATIVE   Leukocytes,Ua NEGATIVE NEGATIVE   WBC, UA 0-5 0 - 5 WBC/hpf   Bacteria, UA NONE SEEN NONE SEEN   Squamous Epithelial / LPF NONE SEEN 0 - 5    Comment:  Performed at [facility], [address] [place], [place]  [address]  SARS Coronavirus 2 by RT PCR (hospital order, performed in [facility] hospital lab) Nasopharyngeal Nasopharyngeal Swab     Status: None   Collection Time: 11/30/19  2:54 AM   Specimen: Nasopharyngeal Swab  Result Value Ref Range   SARS Coronavirus 2 NEGATIVE NEGATIVE    Comment: (NOTE) SARS-CoV-2 target nucleic acids are NOT DETECTED.  The SARS-CoV-2 RNA is generally detectable in upper and lower respiratory specimens during the acute phase of infection. The lowest concentration of SARS-CoV-2 viral copies this assay can detect is 250 copies / mL. A negative result does not preclude SARS-CoV-2 infection and should not be used as the sole basis for treatment or other patient management decisions.  A negative result may occur with improper specimen collection / handling, submission of specimen other than nasopharyngeal swab, presence of viral mutation(s) within the areas targeted by this assay, and inadequate number of viral copies (<250 copies / mL). A negative result must be combined with clinical observations, patient history, and epidemiological information.  Fact Sheet for Patients:   StrictlyIdeas.no  Fact Sheet for Healthcare Providers: BankingDealers.co.za  This test is not yet approved or  cleared by the [place] FDA and has been authorized for detection and/or diagnosis of SARS-CoV-2 by FDA under an Emergency Use Authorization (EUA).  This EUA will remain in effect (meaning this test can be used) for the duration of the COVID-19 declaration under Section 564(b)(1) of the Act, 21 U.S.C. section 360bbb-3(b)(1), unless the authorization is terminated or revoked sooner.  Performed at [facility], [address] [place], [place] [address]   Draw ABG 1 hour after initiation of ventilator     Status: Abnormal   Collection Time:  11/30/19  3:14 AM  Result Value Ref Range   FIO2 1.00    Delivery systems VENTILATOR    Mode ASSIST CONTROL    VT 425 mL   LHR 18 resp/min   Peep/cpap 10.0 cm H20   pH, Arterial 7.16 (LL) 7.35 - 7.45    Comment: CRITICAL RESULT CALLED TO, READ BACK BY AND VERIFIED WITH: KEENE, NP AT 0400 ON 11/30/19 CM[facility],RRT    pCO2 arterial 61 (H) 32 - 48 mmHg   pO2, Arterial 67 (L) 83 - 108 mmHg   Bicarbonate 21.7 20.0 - 28.0 mmol/L   Acid-base deficit 8.2 (H) 0.0 - 2.0 mmol/L   O2 Saturation 86.5 %   Patient temperature 37.0    Collection site RIGHT RADIAL    Sample type ARTERIAL DRAW    Allens test (pass/fail) PASS PASS   Mechanical Rate 18     Comment: Performed at [facility], [address] [place], [place] [address]  Troponin I (High Sensitivity)     Status: Abnormal   Collection Time: 11/30/19  3:25 AM  Result Value Ref Range   Troponin I (High Sensitivity) 123 (HH) <18 ng/L    Comment: CRITICAL RESULT CALLED TO, READ BACK BY AND VERIFIED WITH GRACIE QBHALPF@ 0407 11/30/2019 RH (NOTE) Elevated high sensitivity troponin I (hsTnI) values and significant  changes across serial measurements may suggest ACS but many other  chronic and acute conditions are known to elevate hsTnI results.  Refer to the "Links" section for chest pain algorithms and additional  guidance. Performed at [facility], [address], [place], [place] [address]   Procalcitonin - Baseline     Status: None   Collection Time: 11/30/19  3:25 AM  Result Value Ref Range   Procalcitonin 0.20  ng/mL    Comment:        Interpretation: PCT (Procalcitonin) <= 0.5 ng/mL: Systemic infection (sepsis) is not likely. Local bacterial infection is possible. (NOTE)       Sepsis PCT Algorithm           Lower Respiratory Tract                                      Infection PCT Algorithm    ----------------------------     ----------------------------         PCT < 0.25 ng/mL                PCT < 0.10  ng/mL          Strongly encourage             Strongly discourage   discontinuation of antibiotics    initiation of antibiotics    ----------------------------     -----------------------------       PCT 0.25 - 0.50 ng/mL            PCT 0.10 - 0.25 ng/mL               OR       >80% decrease in PCT            Discourage initiation of                                            antibiotics      Encourage discontinuation           of antibiotics    ----------------------------     -----------------------------         PCT >= 0.50 ng/mL              PCT 0.26 - 0.50 ng/mL               AND        <80% decrease in PCT             Encourage initiation of                                             antibiotics       Encourage continuation           of antibiotics    ----------------------------     -----------------------------        PCT >= 0.50 ng/mL                  PCT > 0.50 ng/mL               AND         increase in PCT                  Strongly encourage                                      initiation of antibiotics    Strongly encourage escalation           of antibiotics                                     -----------------------------  PCT <= 0.25 ng/mL                                                 OR                                        > 80% decrease in PCT                                      Discontinue / Do not initiate                                             antibiotics  Performed at [facility], [address] [place], [place] [address]   Triglycerides     Status: Abnormal   Collection Time: 11/30/19  3:25 AM  Result Value Ref Range   Triglycerides 174 (H) <150 mg/dL    Comment: Performed at [facility], [address] [place], [place] [address]  Glucose, capillary     Status: Abnormal   Collection Time: 11/30/19  3:30 AM  Result Value Ref Range   Glucose-Capillary >600 (HH) 70 - 99 mg/dL     Comment: Glucose reference range applies only to samples taken after fasting for at least 8 hours.  Glucose, capillary     Status: Abnormal   Collection Time: 11/30/19  4:42 AM  Result Value Ref Range   Glucose-Capillary 537 (HH) 70 - 99 mg/dL    Comment: Glucose reference range applies only to samples taken after fasting for at least 8 hours.  MRSA PCR Screening     Status: None   Collection Time: 11/30/19  4:44 AM   Specimen: Nasopharyngeal  Result Value Ref Range   MRSA by PCR NEGATIVE NEGATIVE    Comment:        The GeneXpert MRSA Assay (FDA approved for NASAL specimens only), is one component of a comprehensive MRSA colonization surveillance program. It is not intended to diagnose MRSA infection nor to guide or monitor treatment for MRSA infections. Performed at [facility], [address] [place], [place] [address]   Glucose, capillary     Status: Abnormal   Collection Time: 11/30/19  5:27 AM  Result Value Ref Range   Glucose-Capillary 532 (HH) 70 - 99 mg/dL    Comment: Glucose reference range applies only to samples taken after fasting for at least 8 hours.   Comment 1 Notify RN   Troponin I (High Sensitivity)     Status: Abnormal   Collection Time: 11/30/19  5:32 AM  Result Value Ref Range   Troponin I (High Sensitivity) 337 (HH) <18 ng/L    Comment: CRITICAL VALUE NOTED. VALUE IS CONSISTENT WITH PREVIOUSLY REPORTED/CALLED VALUE DAS (NOTE) Elevated high sensitivity troponin I (hsTnI) values and significant  changes across serial measurements may suggest ACS but many other  chronic and acute conditions are known to elevate hsTnI results.  Refer to the "Links" section for chest pain algorithms and additional  guidance. Performed at [facility], [address] [place],  [place] [address]   Lactic acid, plasma     Status: Abnormal   Collection Time: 11/30/19  5:33 AM  Result Value Ref Range   Lactic Acid, Venous 5.7 (HH) 0.5 - 1.9 mmol/L     Comment: CRITICAL VALUE NOTED. VALUE IS CONSISTENT WITH PREVIOUSLY REPORTED/CALLED VALUE RH Performed at [facility], [address] [place], [place] [address]   CULTURE, BLOOD (ROUTINE X 2) w Reflex to ID Panel     Status: None   Collection Time: 11/30/19  5:33 AM   Specimen: Left Antecubital; Blood  Result Value Ref Range   Specimen Description LEFT ANTECUBITAL    Special Requests      BOTTLES DRAWN AEROBIC AND ANAEROBIC Blood Culture adequate volume   Culture      NO GROWTH 5 DAYS Performed at [facility], [address], [place], [place] [address]    Report Status 12/05/2019 FINAL   Basic metabolic panel     Status: Abnormal   Collection Time: 11/30/19  5:33 AM  Result Value Ref Range   Sodium 141 135 - 145 mmol/L   Potassium 3.7 3.5 - 5.1 mmol/L   Chloride 103 98 - 111 mmol/L   CO2 24 22 - 32 mmol/L   Glucose, Bld 559 (HH) 70 - 99 mg/dL    Comment: CRITICAL RESULT CALLED TO, READ BACK BY AND VERIFIED WITH LESLIE MITTS AT 0716 11/30/19 DAS Glucose reference range applies only to samples taken after fasting for at least 8 hours.    BUN 29 (H) 8 - 23 mg/dL   Creatinine, Ser 1.90 (H) 0.61 - 1.24 mg/dL   Calcium 11.9 (H) 8.9 - 10.3 mg/dL   GFR calc non Af Amer 32 (L) >60 mL/min   GFR calc Af Amer 37 (L) >60 mL/min   Anion gap 14 5 - 15    Comment: Performed at [facility], [address] [place], [place] [address]  Beta-hydroxybutyric acid     Status: None   Collection Time: 11/30/19  5:33 AM  Result Value Ref Range   Beta-Hydroxybutyric Acid 0.12 0.05 - 0.27 mmol/L    Comment: Performed at [facility], [address] [place], [place] [address]  Osmolality     Status: Abnormal   Collection Time: 11/30/19  5:33 AM  Result Value Ref Range   Osmolality 332 (HH) 275 - 295 mOsm/kg    Comment: CRITICAL RESULT CALLED TO, READ BACK BY AND VERIFIED WITH: LESLIE MITTS AT 7026 11/30/19 DAS Performed at [facility], [address] [place], [place] [address]   CULTURE, BLOOD (ROUTINE X 2) w Reflex to ID Panel     Status: None   Collection Time: 11/30/19  5:42 AM   Specimen: BLOOD LEFT FOREARM  Result Value Ref Range   Specimen Description BLOOD LEFT FOREARM    Special Requests      BOTTLES DRAWN AEROBIC AND ANAEROBIC Blood Culture adequate volume   Culture      NO GROWTH 5 DAYS Performed at [facility], [address] [place], [place] [address]    Report Status 12/05/2019 FINAL   Glucose, capillary     Status: Abnormal   Collection Time: 11/30/19  6:27 AM  Result Value Ref Range   Glucose-Capillary 487 (H) 70 - 99 mg/dL    Comment: Glucose reference range applies only to samples taken after fasting for at least 8 hours.  Glucose, capillary     Status: Abnormal   Collection  Time: 11/30/19  7:35 AM  Result Value Ref Range   Glucose-Capillary 427 (H) 70 - 99 mg/dL    Comment: Glucose reference range applies only to samples taken after fasting for at least 8 hours.  Glucose, capillary     Status: Abnormal   Collection Time: 11/30/19  8:04 AM  Result Value Ref Range   Glucose-Capillary 404 (H) 70 - 99 mg/dL    Comment: Glucose reference range applies only to samples taken after fasting for at least 8 hours.  Basic metabolic panel     Status: Abnormal   Collection Time: 11/30/19  8:41 AM  Result Value Ref Range   Sodium 141 135 - 145 mmol/L   Potassium 4.0 3.5 - 5.1 mmol/L   Chloride 102 98 - 111 mmol/L   CO2 24 22 - 32 mmol/L   Glucose, Bld 448 (H) 70 - 99 mg/dL    Comment: Glucose reference range applies only to samples taken after fasting for at least 8 hours.   BUN 30 (H) 8 - 23 mg/dL   Creatinine, Ser 1.80 (H) 0.61 - 1.24 mg/dL   Calcium 11.0 (H) 8.9 - 10.3 mg/dL   GFR calc non Af Amer 34 (L) >60 mL/min   GFR calc Af Amer 40 (L) >60 mL/min   Anion gap 15 5 - 15    Comment: Performed at [facility], [address] [place], [place] [address]  Hemoglobin A1c     Status:  Abnormal   Collection Time: 11/30/19  8:41 AM  Result Value Ref Range   Hgb A1c MFr Bld 11.8 (H) 4.8 - 5.6 %    Comment: (NOTE) Pre diabetes:          5.7%-6.4%  Diabetes:              >6.4%  Glycemic control for   <7.0% adults with diabetes    Mean Plasma Glucose 291.96 mg/dL    Comment: Performed at [facility] [address], [place], [place] [address]  Troponin I (High Sensitivity)     Status: Abnormal   Collection Time: 11/30/19  8:41 AM  Result Value Ref Range   Troponin I (High Sensitivity) 717 (HH) <18 ng/L    Comment: CRITICAL VALUE NOTED. VALUE IS CONSISTENT WITH PREVIOUSLY REPORTED/CALLED VALUE.PMF (NOTE) Elevated high sensitivity troponin I (hsTnI) values and significant  changes across serial measurements may suggest ACS but many other  chronic and acute conditions are known to elevate hsTnI results.  Refer to the "Links" section for chest pain algorithms and additional  guidance. Performed at [facility], [address] [place], [place] [address]   Cholesterol, total     Status: Abnormal   Collection Time: 11/30/19  8:41 AM  Result Value Ref Range   Cholesterol 253 (H) 0 - 200 mg/dL    Comment: Performed at [facility], [address] [place], [place] [address]  Glucose, capillary     Status: Abnormal   Collection Time: 11/30/19  8:48 AM  Result Value Ref Range   Glucose-Capillary 397 (H) 70 - 99 mg/dL    Comment: Glucose reference range applies only to samples taken after fasting for at least 8 hours.  Glucose, capillary     Status: Abnormal   Collection Time: 11/30/19  9:21 AM  Result Value Ref Range   Glucose-Capillary 350 (H) 70 - 99 mg/dL    Comment: Glucose reference range applies only to samples taken after fasting for at least  8 hours.  Glucose, capillary     Status: Abnormal   Collection Time: 11/30/19  9:45 AM  Result Value Ref Range   Glucose-Capillary 374 (H) 70 - 99 mg/dL    Comment: Glucose reference range  applies only to samples taken after fasting for at least 8 hours.  Glucose, capillary     Status: Abnormal   Collection Time: 11/30/19 10:36 AM  Result Value Ref Range   Glucose-Capillary 296 (H) 70 - 99 mg/dL    Comment: Glucose reference range applies only to samples taken after fasting for at least 8 hours.  Culture, respiratory     Status: None   Collection Time: 11/30/19 11:18 AM   Specimen: Tracheal Aspirate; Respiratory  Result Value Ref Range   Specimen Description      TRACHEAL ASPIRATE Performed at [facility], [address] [place], [place] [address]    Special Requests      NONE Performed at [facility], [place], [place] [address]    Gram Stain      FEW WBC PRESENT, PREDOMINANTLY PMN FEW GRAM POSITIVE COCCI RARE YEAST    Culture      FEW Consistent with normal respiratory flora. Performed at [facility], [address] [place], [place] [address]    Report Status 12/02/2019 FINAL   Glucose, capillary     Status: Abnormal   Collection Time: 11/30/19 11:37 AM  Result Value Ref Range   Glucose-Capillary 227 (H) 70 - 99 mg/dL    Comment: Glucose reference range applies only to samples taken after fasting for at least 8 hours.  Glucose, capillary     Status: Abnormal   Collection Time: 11/30/19 12:51 PM  Result Value Ref Range   Glucose-Capillary 207 (H) 70 - 99 mg/dL    Comment: Glucose reference range applies only to samples taken after fasting for at least 8 hours.  Basic metabolic panel     Status: Abnormal   Collection Time: 11/30/19  1:06 PM  Result Value Ref Range   Sodium 140 135 - 145 mmol/L   Potassium 4.4 3.5 - 5.1 mmol/L   Chloride 107 98 - 111 mmol/L   CO2 25 22 - 32 mmol/L   Glucose, Bld 222 (H) 70 - 99 mg/dL    Comment: Glucose reference range applies only to samples taken after fasting for at least 8 hours.   BUN 32 (H) 8 - 23 mg/dL   Creatinine, Ser 1.61 (H) 0.61 - 1.24 mg/dL   Calcium 10.2 8.9 -  10.3 mg/dL   GFR calc non Af Amer 39 (L) >60 mL/min   GFR calc Af Amer 45 (L) >60 mL/min   Anion gap 8 5 - 15    Comment: Performed at [facility], [address] [place], [place] [address]  Basic metabolic panel     Status: None   Collection Time: 11/30/19  1:06 PM  Result Value Ref Range   Sodium DUPLICATE REQUEST 096 - 145 mmol/L   Potassium DUPLICATE REQUEST 3.5 - 5.1 mmol/L   Chloride DUPLICATE REQUEST 98 - 283 mmol/L   CO2 DUPLICATE REQUEST 22 - 32 mmol/L   Glucose, Bld DUPLICATE REQUEST 70 - 99 mg/dL   BUN DUPLICATE REQUEST 8 - 23 mg/dL   Creatinine, Ser DUPLICATE REQUEST 6.62 - 1.24 mg/dL   Calcium DUPLICATE REQUEST 8.9 - 10.3 mg/dL   GFR calc non Af Amer DUPLICATE REQUEST >94 mL/min   GFR calc Af  Amer DUPLICATE REQUEST >95 mL/min   Anion gap DUPLICATE REQUEST 5 - 15    Comment: Performed at [facility], [address] [place], [place] [address]  Glucose, capillary     Status: Abnormal   Collection Time: 11/30/19  1:52 PM  Result Value Ref Range   Glucose-Capillary 168 (H) 70 - 99 mg/dL    Comment: Glucose reference range applies only to samples taken after fasting for at least 8 hours.  Glucose, capillary     Status: Abnormal   Collection Time: 11/30/19  2:47 PM  Result Value Ref Range   Glucose-Capillary 163 (H) 70 - 99 mg/dL    Comment: Glucose reference range applies only to samples taken after fasting for at least 8 hours.  Glucose, capillary     Status: Abnormal   Collection Time: 11/30/19  3:33 PM  Result Value Ref Range   Glucose-Capillary 179 (H) 70 - 99 mg/dL    Comment: Glucose reference range applies only to samples taken after fasting for at least 8 hours.  ECHOCARDIOGRAM COMPLETE     Status: None   Collection Time: 11/30/19  4:09 PM  Result Value Ref Range   Weight 2,871.27 oz   Height 67 in   BP 120/64 mmHg  Glucose, capillary     Status: Abnormal   Collection Time: 11/30/19  4:37 PM  Result Value Ref Range   Glucose-Capillary  154 (H) 70 - 99 mg/dL    Comment: Glucose reference range applies only to samples taken after fasting for at least 8 hours.  Glucose, capillary     Status: Abnormal   Collection Time: 11/30/19  5:33 PM  Result Value Ref Range   Glucose-Capillary 132 (H) 70 - 99 mg/dL    Comment: Glucose reference range applies only to samples taken after fasting for at least 8 hours.  Glucose, capillary     Status: Abnormal   Collection Time: 11/30/19  6:10 PM  Result Value Ref Range   Glucose-Capillary 131 (H) 70 - 99 mg/dL    Comment: Glucose reference range applies only to samples taken after fasting for at least 8 hours.  Glucose, capillary     Status: Abnormal   Collection Time: 11/30/19  6:30 PM  Result Value Ref Range   Glucose-Capillary 190 (H) 70 - 99 mg/dL    Comment: Glucose reference range applies only to samples taken after fasting for at least 8 hours.  Glucose, capillary     Status: Abnormal   Collection Time: 11/30/19  8:22 PM  Result Value Ref Range   Glucose-Capillary 149 (H) 70 - 99 mg/dL    Comment: Glucose reference range applies only to samples taken after fasting for at least 8 hours.  Glucose, capillary     Status: Abnormal   Collection Time: 11/30/19 11:19 PM  Result Value Ref Range   Glucose-Capillary 172 (H) 70 - 99 mg/dL    Comment: Glucose reference range applies only to samples taken after fasting for at least 8 hours.  Glucose, capillary     Status: Abnormal   Collection Time: 12/01/19  3:24 AM  Result Value Ref Range   Glucose-Capillary 187 (H) 70 - 99 mg/dL    Comment: Glucose reference range applies only to samples taken after fasting for at least 8 hours.  Procalcitonin     Status: None   Collection Time: 12/01/19  4:22 AM  Result Value Ref Range   Procalcitonin 1.55 ng/mL    Comment:  Interpretation: PCT > 0.5 ng/mL and <= 2 ng/mL: Systemic infection (sepsis) is possible, but other conditions are known to elevate PCT as well. (NOTE)       Sepsis PCT  Algorithm           Lower Respiratory Tract                                      Infection PCT Algorithm    ----------------------------     ----------------------------         PCT < 0.25 ng/mL                PCT < 0.10 ng/mL          Strongly encourage             Strongly discourage   discontinuation of antibiotics    initiation of antibiotics    ----------------------------     -----------------------------       PCT 0.25 - 0.50 ng/mL            PCT 0.10 - 0.25 ng/mL               OR       >80% decrease in PCT            Discourage initiation of                                            antibiotics      Encourage discontinuation           of antibiotics    ----------------------------     -----------------------------         PCT >= 0.50 ng/mL              PCT 0.26 - 0.50 ng/mL                AND       <80% decrease in PCT             Encourage initiation of                                             antibiotics       Encourage continuation           of antibiotics    ----------------------------     -----------------------------        PCT >= 0.50 ng/mL                  PCT > 0.50 ng/mL               AND         increase in PCT                  Strongly encourage                                      initiation of antibiotics    Strongly encourage escalation           of antibiotics                                     -----------------------------  PCT <= 0.25 ng/mL                                                 OR                                        > 80% decrease in PCT                                      Discontinue / Do not initiate                                             antibiotics  Performed at [facility], [address] [place], [place] [address]   Comprehensive metabolic panel     Status: Abnormal   Collection Time: 12/01/19  4:22 AM  Result Value Ref Range   Sodium 136 135 - 145 mmol/L    Potassium 3.9 3.5 - 5.1 mmol/L   Chloride 104 98 - 111 mmol/L   CO2 25 22 - 32 mmol/L   Glucose, Bld 185 (H) 70 - 99 mg/dL    Comment: Glucose reference range applies only to samples taken after fasting for at least 8 hours.   BUN 32 (H) 8 - 23 mg/dL   Creatinine, Ser 1.52 (H) 0.61 - 1.24 mg/dL   Calcium 9.4 8.9 - 10.3 mg/dL   Total Protein 5.6 (L) 6.5 - 8.1 g/dL   Albumin 2.5 (L) 3.5 - 5.0 g/dL   AST 26 15 - 41 U/L   ALT 23 0 - 44 U/L   Alkaline Phosphatase 81 38 - 126 U/L   Total Bilirubin 0.9 0.3 - 1.2 mg/dL   GFR calc non Af Amer 42 (L) >60 mL/min   GFR calc Af Amer 49 (L) >60 mL/min   Anion gap 7 5 - 15    Comment: Performed at [facility], [address] [place], [place] [address]  Magnesium     Status: None   Collection Time: 12/01/19  4:22 AM  Result Value Ref Range   Magnesium 1.9 1.7 - 2.4 mg/dL    Comment: Performed at [facility], [address] [place], [place] [address]  CBC with Differential/Platelet     Status: Abnormal   Collection Time: 12/01/19  6:12 AM  Result Value Ref Range   WBC 17.2 (H) 4.0 - 10.5 K/uL   RBC 4.30 4.22 - 5.81 MIL/uL   Hemoglobin 12.7 (L) 13.0 - 17.0 g/dL    Comment: REPEATED TO VERIFY   HCT 36.9 (L) 39 - 52 %   MCV 85.8 80.0 - 100.0 fL   MCH 29.5 26.0 - 34.0 pg   MCHC 34.4 30.0 - 36.0 g/dL   RDW 14.4 11.5 - 15.5 %   Platelets 204 150 - 400 K/uL   nRBC 0.0 0.0 - 0.2 %   Neutrophils Relative % 82 %   Neutro Abs 14.1 (H) 1.7 - 7.7 K/uL   Lymphocytes Relative 7 %   Lymphs Abs 1.3 0.7 - 4.0 K/uL  Monocytes Relative 8 %   Monocytes Absolute 1.4 (H) 0 - 1 K/uL   Eosinophils Relative 2 %   Eosinophils Absolute 0.3 0 - 0 K/uL   Basophils Relative 0 %   Basophils Absolute 0.1 0 - 0 K/uL   Immature Granulocytes 1 %   Abs Immature Granulocytes 0.09 (H) 0.00 - 0.07 K/uL    Comment: Performed at [facility], [address] [place], [place] [address]  Glucose, capillary     Status: Abnormal   Collection  Time: 12/01/19  7:08 AM  Result Value Ref Range   Glucose-Capillary 174 (H) 70 - 99 mg/dL    Comment: Glucose reference range applies only to samples taken after fasting for at least 8 hours.  Glucose, capillary     Status: Abnormal   Collection Time: 12/01/19 11:53 AM  Result Value Ref Range   Glucose-Capillary 153 (H) 70 - 99 mg/dL    Comment: Glucose reference range applies only to samples taken after fasting for at least 8 hours.  Glucose, capillary     Status: Abnormal   Collection Time: 12/01/19  4:07 PM  Result Value Ref Range   Glucose-Capillary 132 (H) 70 - 99 mg/dL    Comment: Glucose reference range applies only to samples taken after fasting for at least 8 hours.  Glucose, capillary     Status: Abnormal   Collection Time: 12/01/19  7:39 PM  Result Value Ref Range   Glucose-Capillary 137 (H) 70 - 99 mg/dL    Comment: Glucose reference range applies only to samples taken after fasting for at least 8 hours.  Glucose, capillary     Status: Abnormal   Collection Time: 12/02/19  1:34 AM  Result Value Ref Range   Glucose-Capillary 125 (H) 70 - 99 mg/dL    Comment: Glucose reference range applies only to samples taken after fasting for at least 8 hours.   Comment 1 Document in Chart   Procalcitonin     Status: None   Collection Time: 12/02/19  4:15 AM  Result Value Ref Range   Procalcitonin 0.80 ng/mL    Comment:        Interpretation: PCT > 0.5 ng/mL and <= 2 ng/mL: Systemic infection (sepsis) is possible, but other conditions are known to elevate PCT as well. (NOTE)       Sepsis PCT Algorithm           Lower Respiratory Tract                                      Infection PCT Algorithm    ----------------------------     ----------------------------         PCT < 0.25 ng/mL                PCT < 0.10 ng/mL          Strongly encourage             Strongly discourage   discontinuation of antibiotics    initiation of antibiotics    ----------------------------      -----------------------------       PCT 0.25 - 0.50 ng/mL            PCT 0.10 - 0.25 ng/mL               OR       >80% decrease in PCT  Discourage initiation of                                            antibiotics      Encourage discontinuation           of antibiotics    ----------------------------     -----------------------------         PCT >= 0.50 ng/mL              PCT 0.26 - 0.50 ng/mL                AND       <80% decrease in PCT             Encourage initiation of                                             antibiotics       Encourage continuation           of antibiotics    ----------------------------     -----------------------------        PCT >= 0.50 ng/mL                  PCT > 0.50 ng/mL               AND         increase in PCT                  Strongly encourage                                      initiation of antibiotics    Strongly encourage escalation           of antibiotics                                     -----------------------------                                           PCT <= 0.25 ng/mL                                                 OR                                        > 80% decrease in PCT                                      Discontinue / Do not initiate  antibiotics  Performed at [facility], [address] [place], [place] [address]   CBC with Differential/Platelet     Status: Abnormal   Collection Time: 12/02/19  4:15 AM  Result Value Ref Range   WBC 11.8 (H) 4.0 - 10.5 K/uL   RBC 4.14 (L) 4.22 - 5.81 MIL/uL   Hemoglobin 12.3 (L) 13.0 - 17.0 g/dL   HCT 35.7 (L) 39 - 52 %   MCV 86.2 80.0 - 100.0 fL   MCH 29.7 26.0 - 34.0 pg   MCHC 34.5 30.0 - 36.0 g/dL   RDW 14.5 11.5 - 15.5 %   Platelets 197 150 - 400 K/uL   nRBC 0.0 0.0 - 0.2 %   Neutrophils Relative % 74 %   Neutro Abs 8.8 (H) 1.7 - 7.7 K/uL   Lymphocytes Relative 11 %   Lymphs Abs 1.3 0.7 - 4.0 K/uL    Monocytes Relative 9 %   Monocytes Absolute 1.0 0 - 1 K/uL   Eosinophils Relative 4 %   Eosinophils Absolute 0.5 0 - 0 K/uL   Basophils Relative 1 %   Basophils Absolute 0.1 0 - 0 K/uL   Immature Granulocytes 1 %   Abs Immature Granulocytes 0.08 (H) 0.00 - 0.07 K/uL    Comment: Performed at [facility], [address], [place], [place] [address]  Comprehensive metabolic panel     Status: Abnormal   Collection Time: 12/02/19  4:15 AM  Result Value Ref Range   Sodium 139 135 - 145 mmol/L   Potassium 3.4 (L) 3.5 - 5.1 mmol/L   Chloride 105 98 - 111 mmol/L   CO2 26 22 - 32 mmol/L   Glucose, Bld 125 (H) 70 - 99 mg/dL    Comment: Glucose reference range applies only to samples taken after fasting for at least 8 hours.   BUN 28 (H) 8 - 23 mg/dL   Creatinine, Ser 1.56 (H) 0.61 - 1.24 mg/dL   Calcium 8.6 (L) 8.9 - 10.3 mg/dL   Total Protein 5.3 (L) 6.5 - 8.1 g/dL   Albumin 2.6 (L) 3.5 - 5.0 g/dL   AST 24 15 - 41 U/L   ALT 24 0 - 44 U/L   Alkaline Phosphatase 84 38 - 126 U/L   Total Bilirubin 1.0 0.3 - 1.2 mg/dL   GFR calc non Af Amer 41 (L) >60 mL/min   GFR calc Af Amer 47 (L) >60 mL/min   Anion gap 8 5 - 15    Comment: Performed at [facility], [address], [place], [place] [address]  Phosphorus     Status: None   Collection Time: 12/02/19  4:15 AM  Result Value Ref Range   Phosphorus 2.9 2.5 - 4.6 mg/dL    Comment: Performed at [facility], [address], [place], [place] [address]  Magnesium     Status: None   Collection Time: 12/02/19  4:15 AM  Result Value Ref Range   Magnesium 1.9 1.7 - 2.4 mg/dL    Comment: Performed at [facility], [address] [place], [place] [address]  Glucose, capillary     Status: Abnormal   Collection Time: 12/02/19  4:28 AM  Result Value Ref Range   Glucose-Capillary 117 (H) 70 - 99 mg/dL    Comment: Glucose reference range applies only to samples taken after fasting for at least 8 hours.   Comment  1 Document in Chart   Glucose, capillary  Status: Abnormal   Collection Time: 12/02/19  7:56 AM  Result Value Ref Range   Glucose-Capillary 124 (H) 70 - 99 mg/dL    Comment: Glucose reference range applies only to samples taken after fasting for at least 8 hours.  Blood gas, arterial     Status: Abnormal   Collection Time: 12/02/19 10:48 AM  Result Value Ref Range   FIO2 0.40    Delivery systems VENTILATOR    Mode PRESSURE REGULATED VOLUME CONTROL    VT 430 mL   LHR 22 resp/min   Peep/cpap 5.0 cm H20   pH, Arterial 7.38 7.35 - 7.45   pCO2 arterial 30 (L) 32 - 48 mmHg   pO2, Arterial 65 (L) 83 - 108 mmHg   Bicarbonate 17.7 (L) 20.0 - 28.0 mmol/L   Acid-base deficit 6.0 (H) 0.0 - 2.0 mmol/L   O2 Saturation 92.0 %   Patient temperature 37.0    Collection site LEFT BRACHIAL    Sample type ARTERIAL DRAW    Allens test (pass/fail) PASS PASS   Mechanical Rate 22     Comment: Performed at [facility], [address] [place], [place] [address]  Glucose, capillary     Status: Abnormal   Collection Time: 12/02/19 11:43 AM  Result Value Ref Range   Glucose-Capillary 165 (H) 70 - 99 mg/dL    Comment: Glucose reference range applies only to samples taken after fasting for at least 8 hours.  Glucose, capillary     Status: Abnormal   Collection Time: 12/02/19  4:28 PM  Result Value Ref Range   Glucose-Capillary 194 (H) 70 - 99 mg/dL    Comment: Glucose reference range applies only to samples taken after fasting for at least 8 hours.  Glucose, capillary     Status: Abnormal   Collection Time: 12/02/19  7:25 PM  Result Value Ref Range   Glucose-Capillary 179 (H) 70 - 99 mg/dL    Comment: Glucose reference range applies only to samples taken after fasting for at least 8 hours.  Glucose, capillary     Status: Abnormal   Collection Time: 12/02/19 11:54 PM  Result Value Ref Range   Glucose-Capillary 128 (H) 70 - 99 mg/dL    Comment: Glucose reference range applies only to  samples taken after fasting for at least 8 hours.  Triglycerides     Status: None   Collection Time: 12/03/19  3:30 AM  Result Value Ref Range   Triglycerides 147 <150 mg/dL    Comment: Performed at [facility], [address] [place], [place] [address]  CBC with Differential/Platelet     Status: Abnormal   Collection Time: 12/03/19  3:30 AM  Result Value Ref Range   WBC 13.2 (H) 4.0 - 10.5 K/uL   RBC 4.37 4.22 - 5.81 MIL/uL   Hemoglobin 12.7 (L) 13.0 - 17.0 g/dL   HCT 37.9 (L) 39 - 52 %   MCV 86.7 80.0 - 100.0 fL   MCH 29.1 26.0 - 34.0 pg   MCHC 33.5 30.0 - 36.0 g/dL   RDW 14.4 11.5 - 15.5 %   Platelets 217 150 - 400 K/uL   nRBC 0.0 0.0 - 0.2 %   Neutrophils Relative % 80 %   Neutro Abs 10.7 (H) 1.7 - 7.7 K/uL   Lymphocytes Relative 9 %   Lymphs Abs 1.1 0.7 - 4.0 K/uL   Monocytes Relative 6 %   Monocytes Absolute 0.8 0 - 1 K/uL   Eosinophils  Relative 3 %   Eosinophils Absolute 0.4 0 - 0 K/uL   Basophils Relative 1 %   Basophils Absolute 0.1 0 - 0 K/uL   Immature Granulocytes 1 %   Abs Immature Granulocytes 0.08 (H) 0.00 - 0.07 K/uL    Comment: Performed at [facility], [address] [place], [place] [address]  Comprehensive metabolic panel     Status: Abnormal   Collection Time: 12/03/19  3:30 AM  Result Value Ref Range   Sodium 141 135 - 145 mmol/L   Potassium 4.0 3.5 - 5.1 mmol/L   Chloride 108 98 - 111 mmol/L   CO2 28 22 - 32 mmol/L   Glucose, Bld 140 (H) 70 - 99 mg/dL    Comment: Glucose reference range applies only to samples taken after fasting for at least 8 hours.   BUN 30 (H) 8 - 23 mg/dL   Creatinine, Ser 1.62 (H) 0.61 - 1.24 mg/dL   Calcium 8.4 (L) 8.9 - 10.3 mg/dL   Total Protein 5.6 (L) 6.5 - 8.1 g/dL   Albumin 2.5 (L) 3.5 - 5.0 g/dL   AST 23 15 - 41 U/L   ALT 21 0 - 44 U/L   Alkaline Phosphatase 90 38 - 126 U/L   Total Bilirubin 1.2 0.3 - 1.2 mg/dL   GFR calc non Af Amer 39 (L) >60 mL/min   GFR calc Af Amer 45 (L) >60 mL/min     Anion gap 5 5 - 15    Comment: Performed at [facility], [address], [place], [place] [address]  Magnesium     Status: None   Collection Time: 12/03/19  3:30 AM  Result Value Ref Range   Magnesium 2.3 1.7 - 2.4 mg/dL    Comment: Performed at [facility], [address] [place],  [address]  Glucose, capillary     Status: Abnormal   Collection Time: 12/03/19  3:37 AM  Result Value Ref Range   Glucose-Capillary 130 (H) 70 - 99 mg/dL    Comment: Glucose reference range applies only to samples taken after fasting for at least 8 hours.  Glucose, capillary     Status: Abnormal   Collection Time: 12/03/19  7:29 AM  Result Value Ref Range   Glucose-Capillary 100 (H) 70 - 99 mg/dL    Comment: Glucose reference range applies only to samples taken after fasting for at least 8 hours.  Glucose, capillary     Status: Abnormal   Collection Time: 12/03/19 11:11 AM  Result Value Ref Range   Glucose-Capillary 108 (H) 70 - 99 mg/dL    Comment: Glucose reference range applies only to samples taken after fasting for at least 8 hours.  Glucose, capillary     Status: Abnormal   Collection Time: 12/03/19  4:21 PM  Result Value Ref Range   Glucose-Capillary 143 (H) 70 - 99 mg/dL    Comment: Glucose reference range applies only to samples taken after fasting for at least 8 hours.  Glucose, capillary     Status: Abnormal   Collection Time: 12/03/19  7:29 PM  Result Value Ref Range   Glucose-Capillary 128 (H) 70 - 99 mg/dL    Comment: Glucose reference range applies only to samples taken after fasting for at least 8 hours.  Glucose, capillary     Status: Abnormal   Collection Time: 12/04/19 12:03 AM  Result Value Ref Range   Glucose-Capillary 140 (H) 70 - 99 mg/dL    Comment:  Glucose reference range applies only to samples taken after fasting for at least 8 hours.  Glucose, capillary     Status: Abnormal   Collection Time: 12/04/19  3:45 AM  Result Value Ref Range    Glucose-Capillary 164 (H) 70 - 99 mg/dL    Comment: Glucose reference range applies only to samples taken after fasting for at least 8 hours.  Magnesium     Status: None   Collection Time: 12/04/19  4:44 AM  Result Value Ref Range   Magnesium 2.1 1.7 - 2.4 mg/dL    Comment: Performed at [facility], [address] [place], [place] [address]  Basic metabolic panel     Status: Abnormal   Collection Time: 12/04/19  4:44 AM  Result Value Ref Range   Sodium 140 135 - 145 mmol/L   Potassium 3.8 3.5 - 5.1 mmol/L   Chloride 108 98 - 111 mmol/L   CO2 24 22 - 32 mmol/L   Glucose, Bld 175 (H) 70 - 99 mg/dL    Comment: Glucose reference range applies only to samples taken after fasting for at least 8 hours.   BUN 29 (H) 8 - 23 mg/dL   Creatinine, Ser 1.49 (H) 0.61 - 1.24 mg/dL   Calcium 8.3 (L) 8.9 - 10.3 mg/dL   GFR calc non Af Amer 43 (L) >60 mL/min   GFR calc Af Amer 50 (L) >60 mL/min   Anion gap 8 5 - 15    Comment: Performed at [facility], [address] [place], [place] [address]  Phosphorus     Status: None   Collection Time: 12/04/19  4:44 AM  Result Value Ref Range   Phosphorus 3.4 2.5 - 4.6 mg/dL    Comment: Performed at [facility], [address] [place], [place] [address]  Procalcitonin - Baseline     Status: None   Collection Time: 12/04/19  4:44 AM  Result Value Ref Range   Procalcitonin 1.18 ng/mL    Comment:        Interpretation: PCT > 0.5 ng/mL and <= 2 ng/mL: Systemic infection (sepsis) is possible, but other conditions are known to elevate PCT as well. (NOTE)       Sepsis PCT Algorithm           Lower Respiratory Tract                                      Infection PCT Algorithm    ----------------------------     ----------------------------         PCT < 0.25 ng/mL                PCT < 0.10 ng/mL          Strongly encourage             Strongly discourage   discontinuation of antibiotics    initiation of antibiotics     ----------------------------     -----------------------------       PCT 0.25 - 0.50 ng/mL            PCT 0.10 - 0.25 ng/mL               OR       >80% decrease in PCT            Discourage initiation of  antibiotics      Encourage discontinuation           of antibiotics    ----------------------------     -----------------------------         PCT >= 0.50 ng/mL              PCT 0.26 - 0.50 ng/mL                AND       <80% decrease in PCT             Encourage initiation of                                             antibiotics       Encourage continuation           of antibiotics    ----------------------------     -----------------------------        PCT >= 0.50 ng/mL                  PCT > 0.50 ng/mL               AND         increase in PCT                  Strongly encourage                                      initiation of antibiotics    Strongly encourage escalation           of antibiotics                                     -----------------------------                                           PCT <= 0.25 ng/mL                                                 OR                                        > 80% decrease in PCT                                      Discontinue / Do not initiate                                             antibiotics  Performed at [facility], [address] [place], [place] [address]   Glucose, capillary     Status: Abnormal   Collection Time: 12/04/19  7:32 AM  Result Value Ref Range   Glucose-Capillary 176 (H) 70 - 99 mg/dL    Comment: Glucose reference range applies only to samples taken after fasting for at least 8 hours.  Glucose, capillary     Status: Abnormal   Collection Time: 12/04/19 11:18 AM  Result Value Ref Range   Glucose-Capillary 245 (H) 70 - 99 mg/dL    Comment: Glucose reference range applies only to samples taken after fasting for at least 8 hours.  Glucose,  capillary     Status: Abnormal   Collection Time: 12/04/19  3:53 PM  Result Value Ref Range   Glucose-Capillary 160 (H) 70 - 99 mg/dL    Comment: Glucose reference range applies only to samples taken after fasting for at least 8 hours.  Glucose, capillary     Status: Abnormal   Collection Time: 12/04/19  8:29 PM  Result Value Ref Range   Glucose-Capillary 129 (H) 70 - 99 mg/dL    Comment: Glucose reference range applies only to samples taken after fasting for at least 8 hours.  Glucose, capillary     Status: Abnormal   Collection Time: 12/05/19  1:53 AM  Result Value Ref Range   Glucose-Capillary 111 (H) 70 - 99 mg/dL    Comment: Glucose reference range applies only to samples taken after fasting for at least 8 hours.  Magnesium     Status: None   Collection Time: 12/05/19  4:30 AM  Result Value Ref Range   Magnesium 2.1 1.7 - 2.4 mg/dL    Comment: Performed at [facility], [address] [place], [place] [address]  Basic metabolic panel     Status: Abnormal   Collection Time: 12/05/19  4:30 AM  Result Value Ref Range   Sodium 141 135 - 145 mmol/L   Potassium 3.7 3.5 - 5.1 mmol/L   Chloride 109 98 - 111 mmol/L   CO2 24 22 - 32 mmol/L   Glucose, Bld 120 (H) 70 - 99 mg/dL    Comment: Glucose reference range applies only to samples taken after fasting for at least 8 hours.   BUN 29 (H) 8 - 23 mg/dL   Creatinine, Ser 1.30 (H) 0.61 - 1.24 mg/dL   Calcium 7.9 (L) 8.9 - 10.3 mg/dL   GFR calc non Af Amer 51 (L) >60 mL/min   GFR calc Af Amer 59 (L) >60 mL/min   Anion gap 8 5 - 15    Comment: Performed at [facility], [address] [place], [place] [address]  Phosphorus     Status: None   Collection Time: 12/05/19  4:30 AM  Result Value Ref Range   Phosphorus 2.8 2.5 - 4.6 mg/dL    Comment: Performed at [facility], [address] [place], [place] [address]  CBC with Differential/Platelet     Status: Abnormal   Collection Time: 12/05/19  4:30 AM    Result Value Ref Range   WBC 10.8 (H) 4.0 - 10.5 K/uL   RBC 3.72 (L) 4.22 - 5.81 MIL/uL   Hemoglobin 11.0 (L) 13.0 - 17.0 g/dL   HCT 31.7 (L) 39 - 52 %   MCV 85.2 80.0 - 100.0 fL   MCH 29.6 26.0 - 34.0 pg   MCHC 34.7 30.0 - 36.0 g/dL   RDW 14.3 11.5 - 15.5 %   Platelets 209 150 - 400 K/uL   nRBC 0.0 0.0 - 0.2 %   Neutrophils Relative % 74 %  Neutro Abs 8.1 (H) 1.7 - 7.7 K/uL   Lymphocytes Relative 10 %   Lymphs Abs 1.1 0.7 - 4.0 K/uL   Monocytes Relative 9 %   Monocytes Absolute 1.0 0 - 1 K/uL   Eosinophils Relative 5 %   Eosinophils Absolute 0.5 0 - 0 K/uL   Basophils Relative 1 %   Basophils Absolute 0.1 0 - 0 K/uL   Immature Granulocytes 1 %   Abs Immature Granulocytes 0.06 0.00 - 0.07 K/uL    Comment: Performed at [facility], [address] [place], [place] [address]  Procalcitonin     Status: None   Collection Time: 12/05/19  4:30 AM  Result Value Ref Range   Procalcitonin 0.66 ng/mL    Comment:        Interpretation: PCT > 0.5 ng/mL and <= 2 ng/mL: Systemic infection (sepsis) is possible, but other conditions are known to elevate PCT as well. (NOTE)       Sepsis PCT Algorithm           Lower Respiratory Tract                                      Infection PCT Algorithm    ----------------------------     ----------------------------         PCT < 0.25 ng/mL                PCT < 0.10 ng/mL          Strongly encourage             Strongly discourage   discontinuation of antibiotics    initiation of antibiotics    ----------------------------     -----------------------------       PCT 0.25 - 0.50 ng/mL            PCT 0.10 - 0.25 ng/mL               OR       >80% decrease in PCT            Discourage initiation of                                            antibiotics      Encourage discontinuation           of antibiotics    ----------------------------     -----------------------------         PCT >= 0.50 ng/mL              PCT 0.26 - 0.50  ng/mL                AND       <80% decrease in PCT             Encourage initiation of                                             antibiotics       Encourage continuation           of antibiotics    ----------------------------     -----------------------------  PCT >= 0.50 ng/mL                  PCT > 0.50 ng/mL               AND         increase in PCT                  Strongly encourage                                      initiation of antibiotics    Strongly encourage escalation           of antibiotics                                     -----------------------------                                           PCT <= 0.25 ng/mL                                                 OR                                        > 80% decrease in PCT                                      Discontinue / Do not initiate                                             antibiotics  Performed at [facility], [address] [place], [place] [address]   Glucose, capillary     Status: Abnormal   Collection Time: 12/05/19  5:03 AM  Result Value Ref Range   Glucose-Capillary 118 (H) 70 - 99 mg/dL    Comment: Glucose reference range applies only to samples taken after fasting for at least 8 hours.  Glucose, capillary     Status: Abnormal   Collection Time: 12/05/19  7:39 AM  Result Value Ref Range   Glucose-Capillary 102 (H) 70 - 99 mg/dL    Comment: Glucose reference range applies only to samples taken after fasting for at least 8 hours.  Glucose, capillary     Status: Abnormal   Collection Time: 12/05/19 11:40 AM  Result Value Ref Range   Glucose-Capillary 107 (H) 70 - 99 mg/dL    Comment: Glucose reference range applies only to samples taken after fasting for at least 8 hours.  Glucose, capillary     Status: Abnormal   Collection Time: 12/05/19  4:14 PM  Result Value Ref Range   Glucose-Capillary 129 (H) 70 - 99 mg/dL    Comment: Glucose reference range applies only to samples  taken after fasting  for at least 8 hours.  Glucose, capillary     Status: Abnormal   Collection Time: 12/05/19  7:04 PM  Result Value Ref Range   Glucose-Capillary 115 (H) 70 - 99 mg/dL    Comment: Glucose reference range applies only to samples taken after fasting for at least 8 hours.  Glucose, capillary     Status: Abnormal   Collection Time: 12/06/19 12:17 AM  Result Value Ref Range   Glucose-Capillary 150 (H) 70 - 99 mg/dL    Comment: Glucose reference range applies only to samples taken after fasting for at least 8 hours.  Glucose, capillary     Status: Abnormal   Collection Time: 12/06/19  3:45 AM  Result Value Ref Range   Glucose-Capillary 161 (H) 70 - 99 mg/dL    Comment: Glucose reference range applies only to samples taken after fasting for at least 8 hours.  Triglycerides     Status: None   Collection Time: 12/06/19  3:47 AM  Result Value Ref Range   Triglycerides 140 <150 mg/dL    Comment: Performed at [facility], [address] [place], [place] [address]  Magnesium     Status: None   Collection Time: 12/06/19  3:47 AM  Result Value Ref Range   Magnesium 2.1 1.7 - 2.4 mg/dL    Comment: Performed at [facility], [address] [place], [place] [address]  Basic metabolic panel     Status: Abnormal   Collection Time: 12/06/19  3:47 AM  Result Value Ref Range   Sodium 139 135 - 145 mmol/L   Potassium 4.9 3.5 - 5.1 mmol/L   Chloride 107 98 - 111 mmol/L   CO2 23 22 - 32 mmol/L   Glucose, Bld 165 (H) 70 - 99 mg/dL    Comment: Glucose reference range applies only to samples taken after fasting for at least 8 hours.   BUN 34 (H) 8 - 23 mg/dL   Creatinine, Ser 1.56 (H) 0.61 - 1.24 mg/dL   Calcium 8.4 (L) 8.9 - 10.3 mg/dL   GFR calc non Af Amer 41 (L) >60 mL/min   GFR calc Af Amer 47 (L) >60 mL/min   Anion gap 9 5 - 15    Comment: Performed at [facility], [address] [place], [place] [address]  Phosphorus     Status: Abnormal    Collection Time: 12/06/19  3:47 AM  Result Value Ref Range   Phosphorus 4.9 (H) 2.5 - 4.6 mg/dL    Comment: Performed at [facility], [address] [place], [place] [address]  CBC     Status: Abnormal   Collection Time: 12/06/19  3:47 AM  Result Value Ref Range   WBC 8.4 4.0 - 10.5 K/uL   RBC 4.32 4.22 - 5.81 MIL/uL   Hemoglobin 12.8 (L) 13.0 - 17.0 g/dL   HCT 37.2 (L) 39 - 52 %   MCV 86.1 80.0 - 100.0 fL   MCH 29.6 26.0 - 34.0 pg   MCHC 34.4 30.0 - 36.0 g/dL   RDW 14.1 11.5 - 15.5 %   Platelets 278 150 - 400 K/uL   nRBC 0.0 0.0 - 0.2 %    Comment: Performed at [facility], [address] [place], [place] [address]  Glucose, capillary     Status: Abnormal   Collection Time: 12/06/19  7:41 AM  Result Value Ref Range   Glucose-Capillary 156 (H) 70 - 99 mg/dL    Comment:  Glucose reference range applies only to samples taken after fasting for at least 8 hours.  Glucose, capillary     Status: Abnormal   Collection Time: 12/06/19 11:25 AM  Result Value Ref Range   Glucose-Capillary 186 (H) 70 - 99 mg/dL    Comment: Glucose reference range applies only to samples taken after fasting for at least 8 hours.  Glucose, capillary     Status: Abnormal   Collection Time: 12/06/19  3:38 PM  Result Value Ref Range   Glucose-Capillary 174 (H) 70 - 99 mg/dL    Comment: Glucose reference range applies only to samples taken after fasting for at least 8 hours.  Glucose, capillary     Status: Abnormal   Collection Time: 12/06/19  7:28 PM  Result Value Ref Range   Glucose-Capillary 129 (H) 70 - 99 mg/dL    Comment: Glucose reference range applies only to samples taken after fasting for at least 8 hours.  Glucose, capillary     Status: Abnormal   Collection Time: 12/06/19 11:24 PM  Result Value Ref Range   Glucose-Capillary 131 (H) 70 - 99 mg/dL    Comment: Glucose reference range applies only to samples taken after fasting for at least 8 hours.  Glucose, capillary      Status: Abnormal   Collection Time: 12/07/19  3:44 AM  Result Value Ref Range   Glucose-Capillary 120 (H) 70 - 99 mg/dL    Comment: Glucose reference range applies only to samples taken after fasting for at least 8 hours.  Magnesium     Status: None   Collection Time: 12/07/19  3:47 AM  Result Value Ref Range   Magnesium 2.2 1.7 - 2.4 mg/dL    Comment: Performed at [facility], [address] [place], [place] [address]  Basic metabolic panel     Status: Abnormal   Collection Time: 12/07/19  3:47 AM  Result Value Ref Range   Sodium 143 135 - 145 mmol/L   Potassium 3.7 3.5 - 5.1 mmol/L   Chloride 109 98 - 111 mmol/L   CO2 24 22 - 32 mmol/L   Glucose, Bld 120 (H) 70 - 99 mg/dL    Comment: Glucose reference range applies only to samples taken after fasting for at least 8 hours.   BUN 39 (H) 8 - 23 mg/dL   Creatinine, Ser 1.59 (H) 0.61 - 1.24 mg/dL   Calcium 8.4 (L) 8.9 - 10.3 mg/dL   GFR calc non Af Amer 40 (L) >60 mL/min   GFR calc Af Amer 46 (L) >60 mL/min   Anion gap 10 5 - 15    Comment: Performed at [facility], [address] [place], [place] [address]  Glucose, capillary     Status: Abnormal   Collection Time: 12/07/19  8:58 AM  Result Value Ref Range   Glucose-Capillary 115 (H) 70 - 99 mg/dL    Comment: Glucose reference range applies only to samples taken after fasting for at least 8 hours.  Glucose, capillary     Status: Abnormal   Collection Time: 12/07/19 12:12 PM  Result Value Ref Range   Glucose-Capillary 143 (H) 70 - 99 mg/dL    Comment: Glucose reference range applies only to samples taken after fasting for at least 8 hours.  Glucose, capillary     Status: Abnormal   Collection Time: 12/07/19  7:50 PM  Result Value Ref Range   Glucose-Capillary 121 (H) 70 - 99 mg/dL    Comment:  Glucose reference range applies only to samples taken after fasting for at least 8 hours.  Glucose, capillary     Status: Abnormal   Collection Time: 12/08/19 12:23  AM  Result Value Ref Range   Glucose-Capillary 107 (H) 70 - 99 mg/dL    Comment: Glucose reference range applies only to samples taken after fasting for at least 8 hours.  CBC     Status: Abnormal   Collection Time: 12/08/19  3:27 AM  Result Value Ref Range   WBC 8.4 4.0 - 10.5 K/uL   RBC 4.00 (L) 4.22 - 5.81 MIL/uL   Hemoglobin 11.9 (L) 13.0 - 17.0 g/dL   HCT 34.7 (L) 39 - 52 %   MCV 86.8 80.0 - 100.0 fL   MCH 29.8 26.0 - 34.0 pg   MCHC 34.3 30.0 - 36.0 g/dL   RDW 14.3 11.5 - 15.5 %   Platelets 281 150 - 400 K/uL   nRBC 0.0 0.0 - 0.2 %    Comment: Performed at [facility], [address] [place], [place] [address]  Basic metabolic panel     Status: Abnormal   Collection Time: 12/08/19  3:27 AM  Result Value Ref Range   Sodium 141 135 - 145 mmol/L   Potassium 3.5 3.5 - 5.1 mmol/L   Chloride 106 98 - 111 mmol/L   CO2 25 22 - 32 mmol/L   Glucose, Bld 104 (H) 70 - 99 mg/dL    Comment: Glucose reference range applies only to samples taken after fasting for at least 8 hours.   BUN 38 (H) 8 - 23 mg/dL   Creatinine, Ser 1.56 (H) 0.61 - 1.24 mg/dL   Calcium 8.3 (L) 8.9 - 10.3 mg/dL   GFR calc non Af Amer 41 (L) >60 mL/min   GFR calc Af Amer 47 (L) >60 mL/min   Anion gap 10 5 - 15    Comment: Performed at [facility], [address] [place], [place] [address]  Glucose, capillary     Status: None   Collection Time: 12/08/19  5:11 AM  Result Value Ref Range   Glucose-Capillary 92 70 - 99 mg/dL    Comment: Glucose reference range applies only to samples taken after fasting for at least 8 hours.  Glucose, capillary     Status: Abnormal   Collection Time: 12/08/19  8:06 AM  Result Value Ref Range   Glucose-Capillary 142 (H) 70 - 99 mg/dL    Comment: Glucose reference range applies only to samples taken after fasting for at least 8 hours.  Glucose, capillary     Status: None   Collection Time: 12/08/19 12:24 PM  Result Value Ref Range   Glucose-Capillary 95 70  - 99 mg/dL    Comment: Glucose reference range applies only to samples taken after fasting for at least 8 hours.  Glucose, capillary     Status: Abnormal   Collection Time: 12/08/19  4:29 PM  Result Value Ref Range   Glucose-Capillary 266 (H) 70 - 99 mg/dL    Comment: Glucose reference range applies only to samples taken after fasting for at least 8 hours.  Glucose, capillary     Status: Abnormal   Collection Time: 12/08/19  8:10 PM  Result Value Ref Range   Glucose-Capillary 165 (H) 70 - 99 mg/dL    Comment: Glucose reference range applies only to samples taken after fasting for at least 8 hours.  Glucose, capillary     Status: Abnormal  Collection Time: 12/09/19 12:18 AM  Result Value Ref Range   Glucose-Capillary 100 (H) 70 - 99 mg/dL    Comment: Glucose reference range applies only to samples taken after fasting for at least 8 hours.  CBC     Status: Abnormal   Collection Time: 12/09/19  3:43 AM  Result Value Ref Range   WBC 10.8 (H) 4.0 - 10.5 K/uL   RBC 4.14 (L) 4.22 - 5.81 MIL/uL   Hemoglobin 12.1 (L) 13.0 - 17.0 g/dL   HCT 36.9 (L) 39 - 52 %   MCV 89.1 80.0 - 100.0 fL   MCH 29.2 26.0 - 34.0 pg   MCHC 32.8 30.0 - 36.0 g/dL   RDW 13.8 11.5 - 15.5 %   Platelets 300 150 - 400 K/uL   nRBC 0.0 0.0 - 0.2 %    Comment: Performed at [facility], [address] [place], [place] [address]  Basic metabolic panel     Status: Abnormal   Collection Time: 12/09/19  3:43 AM  Result Value Ref Range   Sodium 140 135 - 145 mmol/L   Potassium 3.3 (L) 3.5 - 5.1 mmol/L   Chloride 107 98 - 111 mmol/L   CO2 28 22 - 32 mmol/L   Glucose, Bld 104 (H) 70 - 99 mg/dL    Comment: Glucose reference range applies only to samples taken after fasting for at least 8 hours.   BUN 27 (H) 8 - 23 mg/dL   Creatinine, Ser 1.26 (H) 0.61 - 1.24 mg/dL   Calcium 8.1 (L) 8.9 - 10.3 mg/dL   GFR calc non Af Amer 53 (L) >60 mL/min   GFR calc Af Amer >60 >60 mL/min   Anion gap 5 5 - 15    Comment:  Performed at [facility], [address] [place], [place] [address]  Glucose, capillary     Status: Abnormal   Collection Time: 12/09/19  4:22 AM  Result Value Ref Range   Glucose-Capillary 103 (H) 70 - 99 mg/dL    Comment: Glucose reference range applies only to samples taken after fasting for at least 8 hours.  Glucose, capillary     Status: Abnormal   Collection Time: 12/09/19  7:57 AM  Result Value Ref Range   Glucose-Capillary 103 (H) 70 - 99 mg/dL    Comment: Glucose reference range applies only to samples taken after fasting for at least 8 hours.  Glucose, capillary     Status: Abnormal   Collection Time: 12/09/19 11:43 AM  Result Value Ref Range   Glucose-Capillary 222 (H) 70 - 99 mg/dL    Comment: Glucose reference range applies only to samples taken after fasting for at least 8 hours.  Glucose, capillary     Status: Abnormal   Collection Time: 12/09/19  5:38 PM  Result Value Ref Range   Glucose-Capillary 223 (H) 70 - 99 mg/dL    Comment: Glucose reference range applies only to samples taken after fasting for at least 8 hours.  Glucose, capillary     Status: Abnormal   Collection Time: 12/09/19  8:06 PM  Result Value Ref Range   Glucose-Capillary 196 (H) 70 - 99 mg/dL    Comment: Glucose reference range applies only to samples taken after fasting for at least 8 hours.  Glucose, capillary     Status: Abnormal   Collection Time: 12/10/19 12:21 AM  Result Value Ref Range   Glucose-Capillary 144 (H) 70 - 99 mg/dL    Comment:  Glucose reference range applies only to samples taken after fasting for at least 8 hours.  Glucose, capillary     Status: Abnormal   Collection Time: 12/10/19  5:11 AM  Result Value Ref Range   Glucose-Capillary 133 (H) 70 - 99 mg/dL    Comment: Glucose reference range applies only to samples taken after fasting for at least 8 hours.  CBC     Status: Abnormal   Collection Time: 12/10/19  5:27 AM  Result Value Ref Range   WBC 11.0 (H) 4.0 -  10.5 K/uL   RBC 4.18 (L) 4.22 - 5.81 MIL/uL   Hemoglobin 12.3 (L) 13.0 - 17.0 g/dL   HCT 36.6 (L) 39 - 52 %   MCV 87.6 80.0 - 100.0 fL   MCH 29.4 26.0 - 34.0 pg   MCHC 33.6 30.0 - 36.0 g/dL   RDW 13.6 11.5 - 15.5 %   Platelets 310 150 - 400 K/uL   nRBC 0.0 0.0 - 0.2 %    Comment: Performed at [facility], [address], [place], [place] [address]  Basic metabolic panel     Status: Abnormal   Collection Time: 12/10/19  5:27 AM  Result Value Ref Range   Sodium 138 135 - 145 mmol/L   Potassium 3.4 (L) 3.5 - 5.1 mmol/L   Chloride 105 98 - 111 mmol/L   CO2 27 22 - 32 mmol/L   Glucose, Bld 149 (H) 70 - 99 mg/dL    Comment: Glucose reference range applies only to samples taken after fasting for at least 8 hours.   BUN 20 8 - 23 mg/dL   Creatinine, Ser 1.08 0.61 - 1.24 mg/dL   Calcium 8.3 (L) 8.9 - 10.3 mg/dL   GFR calc non Af Amer >60 >60 mL/min   GFR calc Af Amer >60 >60 mL/min   Anion gap 6 5 - 15    Comment: Performed at [facility], [address] [place], [place] [address]  Glucose, capillary     Status: Abnormal   Collection Time: 12/10/19  8:50 AM  Result Value Ref Range   Glucose-Capillary 126 (H) 70 - 99 mg/dL    Comment: Glucose reference range applies only to samples taken after fasting for at least 8 hours.  Glucose, capillary     Status: Abnormal   Collection Time: 12/10/19 11:31 AM  Result Value Ref Range   Glucose-Capillary 115 (H) 70 - 99 mg/dL    Comment: Glucose reference range applies only to samples taken after fasting for at least 8 hours.  Glucose, capillary     Status: Abnormal   Collection Time: 12/10/19  5:08 PM  Result Value Ref Range   Glucose-Capillary 192 (H) 70 - 99 mg/dL    Comment: Glucose reference range applies only to samples taken after fasting for at least 8 hours.  Glucose, capillary     Status: Abnormal   Collection Time: 12/10/19  7:38 PM  Result Value Ref Range   Glucose-Capillary 137 (H) 70 - 99 mg/dL    Comment:  Glucose reference range applies only to samples taken after fasting for at least 8 hours.  Glucose, capillary     Status: Abnormal   Collection Time: 12/10/19 11:34 PM  Result Value Ref Range   Glucose-Capillary 127 (H) 70 - 99 mg/dL    Comment: Glucose reference range applies only to samples taken after fasting for at least 8 hours.  Glucose, capillary     Status: None  Collection Time: 12/11/19  4:20 AM  Result Value Ref Range   Glucose-Capillary 80 70 - 99 mg/dL    Comment: Glucose reference range applies only to samples taken after fasting for at least 8 hours.  CBC     Status: Abnormal   Collection Time: 12/11/19  5:19 AM  Result Value Ref Range   WBC 12.6 (H) 4.0 - 10.5 K/uL   RBC 4.53 4.22 - 5.81 MIL/uL   Hemoglobin 13.6 13.0 - 17.0 g/dL   HCT 39.5 39 - 52 %   MCV 87.2 80.0 - 100.0 fL   MCH 30.0 26.0 - 34.0 pg   MCHC 34.4 30.0 - 36.0 g/dL   RDW 14.1 11.5 - 15.5 %   Platelets 351 150 - 400 K/uL   nRBC 0.0 0.0 - 0.2 %    Comment: Performed at [facility], [address], [place], [place] [address]  Basic metabolic panel     Status: Abnormal   Collection Time: 12/11/19  5:19 AM  Result Value Ref Range   Sodium 141 135 - 145 mmol/L   Potassium 3.5 3.5 - 5.1 mmol/L   Chloride 103 98 - 111 mmol/L   CO2 28 22 - 32 mmol/L   Glucose, Bld 79 70 - 99 mg/dL    Comment: Glucose reference range applies only to samples taken after fasting for at least 8 hours.   BUN 17 8 - 23 mg/dL   Creatinine, Ser 1.24 0.61 - 1.24 mg/dL   Calcium 8.8 (L) 8.9 - 10.3 mg/dL   GFR calc non Af Amer 54 (L) >60 mL/min   GFR calc Af Amer >60 >60 mL/min   Anion gap 10 5 - 15    Comment: Performed at [facility], [address] [place], [place] [address]  Glucose, capillary     Status: Abnormal   Collection Time: 12/11/19  7:59 AM  Result Value Ref Range   Glucose-Capillary 67 (L) 70 - 99 mg/dL    Comment: Glucose reference range applies only to samples taken after fasting for at  least 8 hours.  Glucose, capillary     Status: Abnormal   Collection Time: 12/11/19  8:37 AM  Result Value Ref Range   Glucose-Capillary 128 (H) 70 - 99 mg/dL    Comment: Glucose reference range applies only to samples taken after fasting for at least 8 hours.  Glucose, capillary     Status: Abnormal   Collection Time: 12/11/19 11:46 AM  Result Value Ref Range   Glucose-Capillary 148 (H) 70 - 99 mg/dL    Comment: Glucose reference range applies only to samples taken after fasting for at least 8 hours.    Radiology No results found.  Assessment/Plan Essential hypertension blood pressure control important in reducing the progression of atherosclerotic disease. On appropriate oral medications.   Type 2 diabetes mellitus treated with insulin (HCC) blood glucose control important in reducing the progression of atherosclerotic disease. Also, involved in wound healing. On appropriate medications.  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 Well healed and with prosthesis.     Leotis Pain, MD  01/29/2020 3:26 PM    This note was created with Dragon medical transcription system.  Any errors from dictation are purely unintentional

## 2020-01-29 NOTE — Assessment & Plan Note (Signed)
Bilateral BKAs. Well healed and with prosthesis.

## 2020-03-06 ENCOUNTER — Other Ambulatory Visit: Payer: Self-pay

## 2020-03-06 ENCOUNTER — Emergency Department: Payer: Medicare Other

## 2020-03-06 ENCOUNTER — Inpatient Hospital Stay
Admission: EM | Admit: 2020-03-06 | Discharge: 2020-03-08 | DRG: 689 | Disposition: A | Discharge status: Home Health | Payer: Medicare Other | Attending: Internal Medicine | Admitting: Internal Medicine

## 2020-03-06 ENCOUNTER — Inpatient Hospital Stay: Payer: Medicare Other

## 2020-03-06 DIAGNOSIS — I5032 Chronic diastolic (congestive) heart failure: Secondary | ICD-10-CM | POA: Diagnosis present

## 2020-03-06 DIAGNOSIS — R4182 Altered mental status, unspecified: Secondary | ICD-10-CM

## 2020-03-06 DIAGNOSIS — Z87891 Personal history of nicotine dependence: Secondary | ICD-10-CM

## 2020-03-06 DIAGNOSIS — G9341 Metabolic encephalopathy: Secondary | ICD-10-CM | POA: Diagnosis present

## 2020-03-06 DIAGNOSIS — R001 Bradycardia, unspecified: Secondary | ICD-10-CM | POA: Diagnosis present

## 2020-03-06 DIAGNOSIS — N189 Chronic kidney disease, unspecified: Secondary | ICD-10-CM | POA: Diagnosis present

## 2020-03-06 DIAGNOSIS — Z8546 Personal history of malignant neoplasm of prostate: Secondary | ICD-10-CM

## 2020-03-06 DIAGNOSIS — E1152 Type 2 diabetes mellitus with diabetic peripheral angiopathy with gangrene: Secondary | ICD-10-CM | POA: Diagnosis present

## 2020-03-06 DIAGNOSIS — I480 Paroxysmal atrial fibrillation: Secondary | ICD-10-CM | POA: Diagnosis present

## 2020-03-06 DIAGNOSIS — I5030 Unspecified diastolic (congestive) heart failure: Secondary | ICD-10-CM

## 2020-03-06 DIAGNOSIS — N3001 Acute cystitis with hematuria: Principal | ICD-10-CM

## 2020-03-06 DIAGNOSIS — I739 Peripheral vascular disease, unspecified: Secondary | ICD-10-CM | POA: Diagnosis present

## 2020-03-06 DIAGNOSIS — E785 Hyperlipidemia, unspecified: Secondary | ICD-10-CM | POA: Diagnosis present

## 2020-03-06 DIAGNOSIS — I130 Hypertensive heart and chronic kidney disease with heart failure and stage 1 through stage 4 chronic kidney disease, or unspecified chronic kidney disease: Secondary | ICD-10-CM | POA: Diagnosis present

## 2020-03-06 DIAGNOSIS — Z8249 Family history of ischemic heart disease and other diseases of the circulatory system: Secondary | ICD-10-CM

## 2020-03-06 DIAGNOSIS — N179 Acute kidney failure, unspecified: Secondary | ICD-10-CM | POA: Diagnosis present

## 2020-03-06 DIAGNOSIS — I2510 Atherosclerotic heart disease of native coronary artery without angina pectoris: Secondary | ICD-10-CM | POA: Diagnosis present

## 2020-03-06 DIAGNOSIS — Z20822 Contact with and (suspected) exposure to covid-19: Secondary | ICD-10-CM | POA: Diagnosis present

## 2020-03-06 DIAGNOSIS — E1122 Type 2 diabetes mellitus with diabetic chronic kidney disease: Secondary | ICD-10-CM | POA: Diagnosis present

## 2020-03-06 DIAGNOSIS — I482 Chronic atrial fibrillation, unspecified: Secondary | ICD-10-CM | POA: Diagnosis present

## 2020-03-06 DIAGNOSIS — N390 Urinary tract infection, site not specified: Secondary | ICD-10-CM

## 2020-03-06 DIAGNOSIS — Z888 Allergy status to other drugs, medicaments and biological substances status: Secondary | ICD-10-CM | POA: Diagnosis not present

## 2020-03-06 DIAGNOSIS — Z89512 Acquired absence of left leg below knee: Secondary | ICD-10-CM | POA: Diagnosis not present

## 2020-03-06 DIAGNOSIS — Z66 Do not resuscitate: Secondary | ICD-10-CM | POA: Diagnosis present

## 2020-03-06 DIAGNOSIS — E11649 Type 2 diabetes mellitus with hypoglycemia without coma: Secondary | ICD-10-CM | POA: Diagnosis present

## 2020-03-06 DIAGNOSIS — Z89511 Acquired absence of right leg below knee: Secondary | ICD-10-CM

## 2020-03-06 DIAGNOSIS — R569 Unspecified convulsions: Secondary | ICD-10-CM

## 2020-03-06 DIAGNOSIS — E118 Type 2 diabetes mellitus with unspecified complications: Secondary | ICD-10-CM

## 2020-03-06 DIAGNOSIS — Z8616 Personal history of COVID-19: Secondary | ICD-10-CM

## 2020-03-06 DIAGNOSIS — E119 Type 2 diabetes mellitus without complications: Secondary | ICD-10-CM

## 2020-03-06 DIAGNOSIS — Z91041 Radiographic dye allergy status: Secondary | ICD-10-CM | POA: Diagnosis not present

## 2020-03-06 DIAGNOSIS — N19 Unspecified kidney failure: Secondary | ICD-10-CM

## 2020-03-06 DIAGNOSIS — Z833 Family history of diabetes mellitus: Secondary | ICD-10-CM

## 2020-03-06 DIAGNOSIS — I10 Essential (primary) hypertension: Secondary | ICD-10-CM | POA: Diagnosis present

## 2020-03-06 DIAGNOSIS — G4733 Obstructive sleep apnea (adult) (pediatric): Secondary | ICD-10-CM | POA: Diagnosis present

## 2020-03-06 DIAGNOSIS — Z91040 Latex allergy status: Secondary | ICD-10-CM | POA: Diagnosis not present

## 2020-03-06 DIAGNOSIS — Z794 Long term (current) use of insulin: Secondary | ICD-10-CM

## 2020-03-06 DIAGNOSIS — G40909 Epilepsy, unspecified, not intractable, without status epilepticus: Secondary | ICD-10-CM

## 2020-03-06 DIAGNOSIS — S88111A Complete traumatic amputation at level between knee and ankle, right lower leg, initial encounter: Secondary | ICD-10-CM | POA: Diagnosis present

## 2020-03-06 DIAGNOSIS — Z7901 Long term (current) use of anticoagulants: Secondary | ICD-10-CM

## 2020-03-06 DIAGNOSIS — Z7982 Long term (current) use of aspirin: Secondary | ICD-10-CM

## 2020-03-06 DIAGNOSIS — Z79899 Other long term (current) drug therapy: Secondary | ICD-10-CM

## 2020-03-06 LAB — GLUCOSE, CAPILLARY
Glucose-Capillary: 103 mg/dL — ABNORMAL HIGH (ref 70–99)
Glucose-Capillary: 124 mg/dL — ABNORMAL HIGH (ref 70–99)
Glucose-Capillary: 155 mg/dL — ABNORMAL HIGH (ref 70–99)
Glucose-Capillary: 221 mg/dL — ABNORMAL HIGH (ref 70–99)
Glucose-Capillary: 52 mg/dL — ABNORMAL LOW (ref 70–99)
Glucose-Capillary: 68 mg/dL — ABNORMAL LOW (ref 70–99)
Glucose-Capillary: 71 mg/dL (ref 70–99)

## 2020-03-06 LAB — COMPREHENSIVE METABOLIC PANEL
ALT: 26 U/L (ref 0–44)
AST: 32 U/L (ref 15–41)
Albumin: 3.4 g/dL — ABNORMAL LOW (ref 3.5–5.0)
Alkaline Phosphatase: 86 U/L (ref 38–126)
Anion gap: 11 (ref 5–15)
BUN: 43 mg/dL — ABNORMAL HIGH (ref 8–23)
CO2: 24 mmol/L (ref 22–32)
Calcium: 8.5 mg/dL — ABNORMAL LOW (ref 8.9–10.3)
Chloride: 109 mmol/L (ref 98–111)
Creatinine, Ser: 1.54 mg/dL — ABNORMAL HIGH (ref 0.61–1.24)
GFR, Estimated: 41 mL/min — ABNORMAL LOW (ref >60)
Glucose, Bld: 101 mg/dL — ABNORMAL HIGH (ref 70–99)
Potassium: 4.3 mmol/L (ref 3.5–5.1)
Sodium: 144 mmol/L (ref 135–145)
Total Bilirubin: 0.7 mg/dL (ref 0.3–1.2)
Total Protein: 7 g/dL (ref 6.5–8.1)

## 2020-03-06 LAB — URINALYSIS, COMPLETE (UACMP) WITH MICROSCOPIC
Bilirubin Urine: NEGATIVE
Glucose, UA: NEGATIVE mg/dL
Ketones, ur: NEGATIVE mg/dL
Nitrite: NEGATIVE
Protein, ur: 100 mg/dL — AB
RBC / HPF: >50 RBC/hpf — ABNORMAL HIGH (ref 0–5)
Specific Gravity, Urine: 1.015 (ref 1.005–1.030)
WBC, UA: >50 WBC/hpf — ABNORMAL HIGH (ref 0–5)
pH: 7 (ref 5.0–8.0)

## 2020-03-06 LAB — TROPONIN I (HIGH SENSITIVITY)
Troponin I (High Sensitivity): 31 ng/L — ABNORMAL HIGH (ref <18)
Troponin I (High Sensitivity): 32 ng/L — ABNORMAL HIGH (ref <18)

## 2020-03-06 LAB — CBC
HCT: 37.9 % — ABNORMAL LOW (ref 39.0–52.0)
Hemoglobin: 12.5 g/dL — ABNORMAL LOW (ref 13.0–17.0)
MCH: 30.1 pg (ref 26.0–34.0)
MCHC: 33 g/dL (ref 30.0–36.0)
MCV: 91.3 fL (ref 80.0–100.0)
Platelets: 258 10*3/uL (ref 150–400)
RBC: 4.15 MIL/uL — ABNORMAL LOW (ref 4.22–5.81)
RDW: 15.2 % (ref 11.5–15.5)
WBC: 10.5 10*3/uL (ref 4.0–10.5)
nRBC: 0 % (ref 0.0–0.2)

## 2020-03-06 LAB — RESPIRATORY PANEL BY RT PCR (FLU A&B, COVID)
Influenza A by PCR: NEGATIVE
Influenza B by PCR: NEGATIVE
SARS Coronavirus 2 by RT PCR: NEGATIVE

## 2020-03-06 MED ORDER — INSULIN ASPART 100 UNIT/ML ~~LOC~~ SOLN
0.0000 [IU] | Freq: Three times a day (TID) | SUBCUTANEOUS | Status: DC
  Administered 2020-03-06: 3 [IU] via SUBCUTANEOUS
  Administered 2020-03-06: 5 [IU] via SUBCUTANEOUS
  Administered 2020-03-07 – 2020-03-08 (×3): 2 [IU] via SUBCUTANEOUS
  Filled 2020-03-06 (×5): qty 1

## 2020-03-06 MED ORDER — SODIUM CHLORIDE 0.9 % IV SOLN
1.0000 g | INTRAVENOUS | Status: DC
  Administered 2020-03-07 – 2020-03-08 (×2): 1 g via INTRAVENOUS
  Filled 2020-03-06 (×2): qty 1

## 2020-03-06 MED ORDER — INSULIN ASPART 100 UNIT/ML ~~LOC~~ SOLN: 0.0000 [IU] | Freq: Every day | SUBCUTANEOUS | Status: DC

## 2020-03-06 MED ORDER — ASPIRIN EC 81 MG PO TBEC
81.0000 mg | DELAYED_RELEASE_TABLET | Freq: Every day | ORAL | Status: DC
  Administered 2020-03-06 – 2020-03-08 (×3): 81 mg via ORAL
  Filled 2020-03-06 (×3): qty 1

## 2020-03-06 MED ORDER — CHLORTHALIDONE 25 MG PO TABS: 25.0000 mg | ORAL_TABLET | Freq: Every day | ORAL | Status: DC

## 2020-03-06 MED ORDER — DEXTROSE-NACL 5-0.45 % IV SOLN: INTRAVENOUS | Status: DC

## 2020-03-06 MED ORDER — ONDANSETRON HCL 4 MG PO TABS: 4.0000 mg | ORAL_TABLET | Freq: Four times a day (QID) | ORAL | Status: DC | PRN

## 2020-03-06 MED ORDER — ATORVASTATIN CALCIUM 80 MG PO TABS
80.0000 mg | ORAL_TABLET | Freq: Every evening | ORAL | Status: DC
  Administered 2020-03-06 – 2020-03-07 (×2): 80 mg via ORAL
  Filled 2020-03-06 (×3): qty 1
  Filled 2020-03-06 (×2): qty 4

## 2020-03-06 MED ORDER — ACETAMINOPHEN 650 MG RE SUPP: 650.0000 mg | Freq: Four times a day (QID) | RECTAL | Status: DC | PRN

## 2020-03-06 MED ORDER — ACETAMINOPHEN 325 MG PO TABS
650.0000 mg | ORAL_TABLET | Freq: Four times a day (QID) | ORAL | Status: DC | PRN
  Administered 2020-03-08: 650 mg via ORAL
  Filled 2020-03-06: qty 2

## 2020-03-06 MED ORDER — RIVAROXABAN 15 MG PO TABS
15.0000 mg | ORAL_TABLET | Freq: Every day | ORAL | Status: DC
  Administered 2020-03-06 – 2020-03-07 (×2): 15 mg via ORAL
  Filled 2020-03-06 (×3): qty 1

## 2020-03-06 MED ORDER — ONDANSETRON HCL 4 MG/2ML IJ SOLN: 4.0000 mg | Freq: Four times a day (QID) | INTRAMUSCULAR | Status: DC | PRN

## 2020-03-06 MED ORDER — SODIUM CHLORIDE 0.9 % IV SOLN
1.0000 g | Freq: Once | INTRAVENOUS | Status: AC
  Administered 2020-03-06: 1 g via INTRAVENOUS
  Filled 2020-03-06: qty 10

## 2020-03-06 MED ORDER — SODIUM CHLORIDE 0.9 % IV SOLN: INTRAVENOUS | Status: DC

## 2020-03-06 NOTE — H&P (Signed)
History and Physical    Olson Lucarelli XNA:[id number] DOB: [birth year] DOA: 03/06/2020  PCP: Corey Skains, MD   Patient coming from: Home  I have personally briefly reviewed patient's old medical records in [place]  Chief Complaint: Altered mental status  HPI: [name] is a 85 y.o. male with medical history significant for A. fib on Xarelto HFpEF, diabetes mellitus with PVD status post bilateral BKA, history of COVID-19 pneumonia 01/2019, CAD, who presents to the emergency room by EMS with altered mental status.  History is limited due to patient's chief complaint of altered mental status and is such taken from wife over the phone.  She states that patient was in his usual state of health until earlier in the day on the day of arrival when he was not acting himself.  She states that he was screaming out and yelling a lot to the point where she had to call rescue.  She denied that he had any nausea, vomiting or complaints of abdominal pain, any cough shortness of breath or fevers or chills.  He had no complaints of chest pain or any other complaints for that matter and was simply acting differently.  On his arrival here he was apparently soaked in foul-smelling urine and his diapers were soiled. ED Course: On arrival he was afebrile, mildly tachypneic at 22 with otherwise normal vitals.  Blood work significant for creatinine of 1.54 above baseline of 1.08 with strongly positive urinalysis.  Troponin slightly elevated at 31 but with flat trend to 32.  CT head showed no acute.  Intracranial findings.  Patient started on IV Rocephin.  Hospitalist consulted for admission.   Review of Systems: Unable to obtain due to altered mental status   Past Medical History:  Diagnosis Date  . Arthritis   . Atrial fibrillation ([place])   . Carcinoma of prostate ([place])   . CHF (congestive heart failure) ([place])   . Chronic kidney disease   . Diabetes mellitus without complication  ([place])   . ED (erectile dysfunction)   . Frequent urination   . Hyperlipidemia   . Hypertension   . Moderate mitral insufficiency   . Peripheral vascular disease ([place])   . Pneumonia 04/2016  . Prostate cancer ([place])   . Sleep apnea    OSA--USE C-PAP  . Ulcer of left foot due to type 2 diabetes mellitus ([place])   . Urinary stress incontinence, male     Past Surgical History:  Procedure Laterality Date  . AMPUTATION Left 01/12/2017   Procedure: AMPUTATION BELOW KNEE;  Surgeon: Algernon Huxley, MD;  Location: [facility] ORS;  Service: General;  Laterality: Left;  . AMPUTATION Right 12/27/2018   Procedure: AMPUTATION BELOW KNEE;  Surgeon: Algernon Huxley, MD;  Location: [facility] ORS;  Service: General;  Laterality: Right;  . APLIGRAFT PLACEMENT Left 10/15/2016   Procedure: APLIGRAFT PLACEMENT;  Surgeon: Albertine Patricia, DPM;  Location: [facility] ORS;  Service: Podiatry;  Laterality: Left;  necrotic ulcer  . CHOLECYSTECTOMY    . COLONOSCOPY WITH PROPOFOL N/A 01/02/2019   Procedure: COLONOSCOPY WITH PROPOFOL;  Surgeon: Lucilla Lame, MD;  Location: [facility] ENDOSCOPY;  Service: Endoscopy;  Laterality: N/A;  . COLONOSCOPY WITH PROPOFOL N/A 01/05/2019   Procedure: COLONOSCOPY WITH PROPOFOL;  Surgeon: Lucilla Lame, MD;  Location: [facility] ENDOSCOPY;  Service: Endoscopy;  Laterality: N/A;  . ESOPHAGOGASTRODUODENOSCOPY (EGD) WITH PROPOFOL N/A 01/02/2019   Procedure: ESOPHAGOGASTRODUODENOSCOPY (EGD) WITH PROPOFOL;  Surgeon: Lucilla Lame, MD;  Location: [facility] ENDOSCOPY;  Service: Endoscopy;  Laterality: N/A;  . EYE SURGERY Bilateral    Cataract Extraction with IOL  . HIP ARTHROPLASTY Right 10/13/2018   Procedure: ARTHROPLASTY BIPOLAR HIP (HEMIARTHROPLASTY);  Surgeon: Earnestine Leys, MD;  Location: [facility] ORS;  Service: Orthopedics;  Laterality: Right;  . IRRIGATION AND DEBRIDEMENT FOOT Left 10/15/2016   Procedure: IRRIGATION AND DEBRIDEMENT FOOT-EXCISIONAL DEBRIDEMENT OF SKIN 3RD, 4TH AND 5TH TOES WITH APPLICATION OF APLIGRAFT;  Surgeon:  Albertine Patricia, DPM;  Location: [facility] ORS;  Service: Podiatry;  Laterality: Left;  necrotic,gangrene  . IRRIGATION AND DEBRIDEMENT FOOT Left 12/09/2016   Procedure: IRRIGATION AND DEBRIDEMENT FOOT-MUSCLE/FASCIA, RESECTION OF FOURTH AND FIFTH METATARSAL NECROTIC BONE;  Surgeon: Albertine Patricia, DPM;  Location: [facility] OR[facility];  Service: Podiatry;  Laterality: Left;  . IRRIGATION AND DEBRIDEMENT FOOT Right 12/23/2018   Procedure: IRRIGATION AND DEBRIDEMENT FOOT and wound vac application;  Surgeon: Samara Deist, DPM;  Location: [facility] ORS;  Service: Podiatry;  Laterality: Right;  . LEFT HEART CATH N/A 06/25/2019   Procedure: Left Heart Cath and Coronary Angiography;  Surgeon: Isaias Cowman, MD;  Location: [place] CV LAB;  Service: Cardiovascular;  Laterality: N/A;  . LEFT HEART CATH AND CORONARY ANGIOGRAPHY N/A 06/05/2018   Procedure: LEFT HEART CATH AND CORONARY ANGIOGRAPHY;  Surgeon: Yolonda Kida, MD;  Location: [place] [facility];  Service: Cardiovascular;  Laterality: N/A;  . LOWER EXTREMITY ANGIOGRAPHY Left 08/16/2016   Procedure: Lower Extremity Angiography;  Surgeon: Algernon Huxley, MD;  Location: [place] [facility];  Service: Cardiovascular;  Laterality: Left;  . LOWER EXTREMITY ANGIOGRAPHY Left 09/01/2016   Procedure: Lower Extremity Angiography;  Surgeon: Algernon Huxley, MD;  Location: [place] [facility];  Service: Cardiovascular;  Laterality: Left;  . LOWER EXTREMITY ANGIOGRAPHY Left 10/14/2016   Procedure: Lower Extremity Angiography;  Surgeon: Algernon Huxley, MD;  Location: [place] [facility] LAB;  Service: Cardiovascular;  Laterality: Left;  . LOWER EXTREMITY ANGIOGRAPHY Left 12/20/2016   Procedure: Lower Extremity Angiography;  Surgeon: Algernon Huxley, MD;  Location: [place] [facility];  Service: Cardiovascular;  Laterality: Left;  . LOWER EXTREMITY ANGIOGRAPHY Right 12/18/2018   Procedure: LOWER EXTREMITY ANGIOGRAPHY;  Surgeon: Algernon Huxley, MD;  Location: [place] [facility];   Service: Cardiovascular;  Laterality: Right;  . LOWER EXTREMITY INTERVENTION  09/01/2016   Procedure: Lower Extremity Intervention;  Surgeon: Algernon Huxley, MD;  Location: [place] [facility];  Service: Cardiovascular;;  . PROSTATE SURGERY     removal  . TONSILLECTOMY     as a child     reports that he quit smoking about 25 years ago. His smoking use included cigarettes. He smoked 0.25 packs per day. He has never used smokeless tobacco. He reports that he does not drink alcohol and does not use drugs.  Allergies  Allergen Reactions  . Contrast Media [Iodinated Diagnostic Agents] Shortness Of Breath    SOB  . Iohexol Shortness Of Breath     Desc: Respiratory Distress, Laryngedema, diaphoresis, Onset Date: [id number]   . Metrizamide Shortness Of Breath    SOB SOB SOB   . Latex Rash    Family History  Problem Relation Age of Onset  . Hypertension Mother   . Diabetes Mother   . Prostate cancer Neg Hx   . Bladder Cancer Neg Hx   . Kidney disease Neg Hx       Prior to Admission medications   Medication Sig Start Date End Date Taking? Authorizing Provider  acetaminophen (TYLENOL) 325 MG tablet Take 650  mg by mouth every 6 (six) hours as needed.    [provider]  amiodarone (PACERONE) 200 MG tablet Take 200 mg by mouth daily. 09/13/18   [provider]  aspirin EC 81 MG tablet Take 1 tablet (81 mg total) by mouth daily. 12/18/18   Algernon Huxley, MD  atorvastatin (LIPITOR) 80 MG tablet Take 80 mg by mouth every evening. 07/31/18   [provider]  bisacodyl (DULCOLAX) 5 MG EC tablet Take 1 tablet (5 mg total) by mouth daily as needed for moderate constipation. 01/05/19   [name], MD  chlorthalidone (HYGROTON) 25 MG tablet Take 25 mg by mouth daily.    [provider]  Cholecalciferol (VITAMIN D-3) 25 MCG (1000 UT) CAPS Take 1,000 Units by mouth daily at 6 (six) AM.     [provider]  divalproex (DEPAKOTE ER) 500 MG 24 hr tablet Take 1  tablet (500 mg total) by mouth daily. 02/16/19   Charlynne Cousins, MD  Ensure Max Protein (ENSURE MAX PROTEIN) LIQD Take 330 mLs (11 oz total) by mouth 2 (two) times daily. 01/05/19   [name], MD  ferrous sulfate 325 (65 FE) MG tablet Take 1 tablet (325 mg total) by mouth daily with breakfast. 10/18/18   [name] Mandes, MD  gabapentin (NEURONTIN) 300 MG capsule Take 300 mg by mouth 3 (three) times daily.  02/07/19   [provider]  insulin glargine (LANTUS) 100 UNIT/ML injection Inject 0.15 mLs (15 Units total) into the skin at bedtime. 10/18/18   [name] Mandes, MD  insulin lispro (HUMALOG) 100 UNIT/ML injection Inject 0.03 mLs (3 Units total) into the skin 3 (three) times daily with meals. Patient taking differently: Inject 3 Units into the skin 3 (three) times daily with meals. Plus 1 additional unit for every 50 BG points over 200 (max 5 additional units per dose) 11/28/17   Wieting, Richard, MD  isosorbide mononitrate (IMDUR) 30 MG 24 hr tablet Take 60 mg by mouth daily. 11/07/19   [provider]  metFORMIN (GLUCOPHAGE) 500 MG tablet Take 500 mg by mouth daily with breakfast.     [provider]  metoprolol tartrate (LOPRESSOR) 25 MG tablet Take 25 mg by mouth 3 (three) times daily.    [provider]  Multiple Vitamin (MULTIVITAMIN WITH MINERALS) TABS tablet Take 1 tablet by mouth daily. 01/05/19   [name], MD  nitroGLYCERIN (NITROSTAT) 0.4 MG SL tablet Place 1 tablet (0.4 mg total) under the tongue every 5 (five) minutes as needed for chest pain. 06/09/18   Hillary Bow, MD  potassium chloride (KLOR-CON) 20 MEQ packet Take by mouth 2 (two) times daily.    [provider]  Rivaroxaban (XARELTO) 15 MG TABS tablet Take 1 tablet (15 mg total) by mouth daily with supper. Hold Xarelto for 3 days.  Start on January 09, 2019. 01/09/19   [name], MD  senna (SENOKOT) 8.6 MG TABS tablet Take 1 tablet (8.6 mg total) by mouth daily as needed for mild constipation.  01/05/19   [name], MD  TIADYLT ER 120 MG 24 hr capsule Take 120 mg by mouth daily. 10/30/19   [provider]  zinc sulfate 220 (50 Zn) MG capsule Take 220 mg by mouth daily.    [provider]    Physical Exam: Vitals:   03/06/20 0215 03/06/20 0300 03/06/20 0315 03/06/20 0330  BP: (!) 149/56 (!) 175/60 (!) 174/53 (!) 166/64  Pulse: (!) 45 (!) 42 (!) 44 Marland Kitchen)  43  Resp: (!) 0 20 17 20   Temp:      TempSrc:      SpO2: 96% 94% 96% 97%     Vitals:   03/06/20 0215 03/06/20 0300 03/06/20 0315 03/06/20 0330  BP: (!) 149/56 (!) 175/60 (!) 174/53 (!) 166/64  Pulse: (!) 45 (!) 42 (!) 44 (!) 43  Resp: (!) 0 20 17 20   Temp:      TempSrc:      SpO2: 96% 94% 96% 97%      Constitutional:  Drowsy, arousable with shaking but confused when awaken. Not in any apparent distress HEENT:      Head: Normocephalic and atraumatic.         Eyes: PERLA, EOMI, Conjunctivae are normal. Sclera is non-icteric.       Mouth/Throat: Mucous membranes are moist.       Neck: Supple with no signs of meningismus. Cardiovascular: Regular rate and rhythm. No murmurs, gallops, or rubs. . No JVD.  Respiratory: Respiratory effort normal .Lungs sounds clear bilaterally. No wheezes, crackles, or rhonchi.  Gastrointestinal: Soft, non tender, and non distended with positive bowel sounds. No rebound or guarding. Genitourinary: No CVA tenderness. Musculoskeletal: Nontender with normal range of motion in all extremities.  Bilateral BKA Neurologic:  Face is symmetric. Moving all extremities. No gross focal neurologic deficits . Skin: Skin is warm, dry.  No rash or ulcers    Labs on Admission: I have personally reviewed following labs and imaging studies  CBC: Recent Labs  Lab 03/06/20 0100  WBC 10.5  HGB 12.5*  HCT 37.9*  MCV 91.3  PLT 784   Basic Metabolic Panel: Recent Labs  Lab 03/06/20 0100  NA 144  K 4.3  CL 109  CO2 24  GLUCOSE 101*  BUN 43*  CREATININE 1.54*  CALCIUM 8.5*    GFR: CrCl cannot be calculated (Unknown ideal weight.). Liver Function Tests: Recent Labs  Lab 03/06/20 0100  AST 32  ALT 26  ALKPHOS 86  BILITOT 0.7  PROT 7.0  ALBUMIN 3.4*   No results for input(s): LIPASE, AMYLASE in the last 168 hours. No results for input(s): AMMONIA in the last 168 hours. Coagulation Profile: No results for input(s): INR, PROTIME in the last 168 hours. Cardiac Enzymes: No results for input(s): CKTOTAL, CKMB, CKMBINDEX, TROPONINI in the last 168 hours. BNP (last 3 results) No results for input(s): PROBNP in the last 8760 hours. HbA1C: No results for input(s): HGBA1C in the last 72 hours. CBG: No results for input(s): GLUCAP in the last 168 hours. Lipid Profile: No results for input(s): CHOL, HDL, LDLCALC, TRIG, CHOLHDL, LDLDIRECT in the last 72 hours. Thyroid Function Tests: No results for input(s): TSH, T4TOTAL, FREET4, T3FREE, THYROIDAB in the last 72 hours. Anemia Panel: No results for input(s): VITAMINB12, FOLATE, FERRITIN, TIBC, IRON, RETICCTPCT in the last 72 hours. Urine analysis:    Component Value Date/Time   COLORURINE YELLOW (A) 03/06/2020 0101   APPEARANCEUR CLOUDY (A) 03/06/2020 0101   LABSPEC 1.015 03/06/2020 0101   PHURINE 7.0 03/06/2020 0101   GLUCOSEU NEGATIVE 03/06/2020 0101   HGBUR LARGE (A) 03/06/2020 0101   BILIRUBINUR NEGATIVE 03/06/2020 0101   KETONESUR NEGATIVE 03/06/2020 0101   PROTEINUR 100 (A) 03/06/2020 0101   NITRITE NEGATIVE 03/06/2020 0101   LEUKOCYTESUR LARGE (A) 03/06/2020 0101    Radiological Exams on Admission: CT Head Wo Contrast  Result Date: 03/06/2020 CLINICAL DATA:  Mental status change, unknown coughs. High blood pressure. EXAM: CT HEAD WITHOUT CONTRAST  TECHNIQUE: Contiguous axial images were obtained from the base of the skull through the vertex without intravenous contrast. COMPARISON:  CT head 11/30/2019 FINDINGS: Brain: Cerebral ventricle sizes are concordant with the degree of cerebral volume  loss. Patchy and confluent areas of decreased attenuation are noted throughout the deep and periventricular white matter of the cerebral hemispheres bilaterally, compatible with chronic microvascular ischemic disease. No evidence of large-territorial acute infarction. No parenchymal hemorrhage. No mass lesion. No extra-axial collection. No mass effect or midline shift. No hydrocephalus. Basilar cisterns are patent. Vascular: No hyperdense vessel. Skull: No acute fracture or focal lesion. Sinuses/Orbits: Paranasal sinuses and mastoid air cells are clear. The orbits are unremarkable. Other: Atherosclerotic calcifications are present within the cavernous internal carotid arteries. IMPRESSION: No acute intracranial abnormality. Electronically Signed   By: Iven Finn M.D.   On: 03/06/2020 01:27     Assessment/Plan 85 year old male with history of A. fib on Xarelto HFpEF, diabetes mellitus with PVD status post bilateral BKA, history of COVID-19 pneumonia 01/2019, CAD, presenting with altered mental status.     Acute metabolic encephalopathy -Likely secondary to UTI -CT head with no acute intracranial findings -Fall and aspiration precautions, neurologic checks and treat UTI    AKI (acute kidney injury) ([place]) -Creatinine 1.54 above baseline 1.08 -IV hydration and monitor renal function    UTI (urinary tract infection) -IV Rocephin -Follow cultures    History of prostate cancer -No acute concerns at this time    Type 2 diabetes mellitus treated with insulin (HCC) -Sliding scale insulin coverage    Essential hypertension -Continue home metoprolol    PAD (peripheral artery disease) (HCC) Bilateral BKA status -Continue home aspirin and atorvastatin    Atrial fibrillation, chronic (HCC) -Continue Xarelto -Continue amiodarone and metoprolol pending med rec   CAD (coronary artery disease) -Continue metoprolol, aspirin and atorvastatin   (HFpEF) heart failure with preserved ejection  fraction (HCC) -Euvolemic -Monitor for fluid overload in view of IV hydration and management of AKI -Continue metoprolol.  Not currently on ACE/ARB or diuretic    DVT prophylaxis: Xarelto Code Status: DNR, conversation with wife Family Communication: Wife via telephone Disposition Plan: Back to previous home environment Consults called: none  Status:At the time of admission, it appears that the appropriate admission status for this patient is INPATIENT. This is judged to be reasonable and necessary in order to provide the required intensity of service to ensure the patient's safety given the presenting symptoms, physical exam findings, and initial radiographic and laboratory data in the context of their  Comorbid conditions.   Patient requires inpatient status due to high intensity of service, high risk for further deterioration and high frequency of surveillance required.   I certify that at the point of admission it is my clinical judgment that the patient will require inpatient hospital care spanning beyond [place] MD [facility]     03/06/2020, 4:40 AM

## 2020-03-06 NOTE — ED Notes (Signed)
Pt alert and eating meal tray at this time.

## 2020-03-06 NOTE — ED Provider Notes (Signed)
[name]  [facility] Emergency Department Provider Note  ____________________________________________   First MD Initiated Contact with Patient 03/06/20 [phone]     (approximate)  I have reviewed the triage vital signs and the nursing notes.   HISTORY  Chief Complaint Altered Mental Status    HPI [name] is a 85 y.o. male with below list of previous medical conditions including diabetes mellitus congestive heart failure chronic kidney disease and atrial fibrillation presents to the emergency department via EMS secondary to call for altered mental status.  EMS states that the patient's wife states that he has had progressive altered mental status over the course of the day.  Patient denies any complaints.  Patient does however present to the emergency department saturated and foul-smelling urine.  Patient depends also filled with stool.        Past Medical History:  Diagnosis Date   Arthritis    Atrial fibrillation ([place])    Carcinoma of prostate ([place])    CHF (congestive heart failure) ([place])    Chronic kidney disease    Diabetes mellitus without complication [facility])    ED (erectile dysfunction)    Frequent urination    Hyperlipidemia    Hypertension    Moderate mitral insufficiency    Peripheral vascular disease ([place])    Pneumonia 04/2016   Prostate cancer ([place])    Sleep apnea    OSA--USE C-PAP   Ulcer of left foot due to type 2 diabetes mellitus ([place])    Urinary stress incontinence, male     Patient Active Problem List   Diagnosis Date Noted   CAD (coronary artery disease) 03/06/2020   Personal history of COVID-19 93/90/3009   Acute metabolic encephalopathy 23/30/0762   UTI (urinary tract infection) 03/06/2020   (HFpEF) heart failure with preserved ejection fraction ([place]) 03/06/2020   Goals of care, counseling/discussion    Palliative care by specialist    Seizures ([place]) 11/30/2019   ACS (acute coronary  syndrome) ([place]) 06/24/2019   Bradycardia 05/16/2019   Paroxysmal A-fib ([place]) 03/27/2019   Hx of BKA, right ([place]) 03/02/2019   Severe protein-calorie malnutrition ([place]) 26/33/3545   Acute systolic CHF (congestive heart failure) ([place]) 02/16/2019   Acute respiratory failure with hypoxia ([place]) 02/08/2019   Pneumonia due to COVID-19 virus 02/08/2019   Below-knee amputation of right lower extremity ([place]) 01/15/2019   Polyp of descending colon    Iron deficiency anemia due to chronic blood loss    Sacral decubitus ulcer, stage II ([place]) 12/26/2018   Acute respiratory failure ([place]) 12/20/2018   Femur fracture ([place]) 10/13/2018   Chest pain 06/03/2018   NSTEMI (non-ST elevated myocardial infarction) ([place]) 01/16/2018   Acute CHF (congestive heart failure) ([place]) 11/23/2017   Atrial fibrillation with rapid ventricular response ([place]) 10/15/2017   BKA stump complication ([place]) 62/56/3893   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 ([place]) 02/10/2017   S/P BKA (below knee amputation) unilateral, left ([place]) 02/01/2017   Chronic diastolic heart failure ([place]) 01/21/2017   Pressure injury of skin 01/18/2017   Atherosclerosis of artery of extremity with gangrene ([place]) 01/05/2017   Diabetic foot ulcer ([place]) 12/29/2016   Ataxia 10/21/2016   Atherosclerosis of native arteries of the extremities with ulceration ([place]) 10/12/2016   History of femoral angiogram 09/06/2016   Left leg pain 09/06/2016   Leukocytosis 09/06/2016   Generalized weakness 09/06/2016   AKI (acute kidney injury) ([place]) 09/06/2016   Atrial fibrillation, chronic ([place]) 08/30/2016   Pneumonia 05/19/2016   Hyperlipidemia 04/20/2016  Back pain 04/19/2016   Type 2 diabetes mellitus treated with insulin ([place]) 04/19/2016   Essential hypertension 04/19/2016   PAD (peripheral artery disease) ([place]) 04/19/2016   Erectile dysfunction following radical prostatectomy 06/25/2015   History of prostate  cancer 12/23/2014   Incontinence 12/23/2014    Past Surgical History:  Procedure Laterality Date   AMPUTATION Left 01/12/2017   Procedure: AMPUTATION BELOW KNEE;  Surgeon: Algernon Huxley, MD;  Location: [facility] ORS;  Service: General;  Laterality: Left;   AMPUTATION Right 12/27/2018   Procedure: AMPUTATION BELOW KNEE;  Surgeon: Algernon Huxley, MD;  Location: [facility] ORS;  Service: General;  Laterality: Right;   APLIGRAFT PLACEMENT Left 10/15/2016   Procedure: APLIGRAFT PLACEMENT;  Surgeon: Albertine Patricia, DPM;  Location: [facility] ORS;  Service: Podiatry;  Laterality: Left;  necrotic ulcer   CHOLECYSTECTOMY     COLONOSCOPY WITH PROPOFOL N/A 01/02/2019   Procedure: COLONOSCOPY WITH PROPOFOL;  Surgeon: Lucilla Lame, MD;  [facility] END[facility]COPY;  Service: Endoscopy;  Laterality: N/A;   COLONOSCOPY WITH PROPOFOL N/A 01/05/2019   Procedure: COLONOSCOPY WITH PROPOFOL;  Surgeon: Lucilla Lame, MD;  Location: [facility] ENDOSCOPY;  Service: Endoscopy;  Laterality: N/A;   ESOPHAGOGASTRODUODENOSCOPY (EGD) WITH PROPOFOL N/A 01/02/2019   Procedure: ESOPHAGOGASTRODUODENOSCOPY (EGD) WITH PROPOFOL;  Surgeon: Lucilla Lame, MD;  Location: [facility] ENDOSCOPY;  Service: Endoscopy;  Laterality: N/A;   EYE SURGERY Bilateral    Cataract Extraction with IOL   HIP ARTHROPLASTY Right 10/13/2018   Procedure: ARTHROPLASTY BIPOLAR HIP (HEMIARTHROPLASTY);  Surgeon: Earnestine Leys, MD;  Location: [facility] ORS;  Service: Orthopedics;  Laterality: Right;   IRRIGATION AND DEBRIDEMENT FOOT Left 10/15/2016   Procedure: IRRIGATION AND DEBRIDEMENT FOOT-EXCISIONAL DEBRIDEMENT OF SKIN 3RD, 4TH AND 5TH TOES WITH APPLICATION OF APLIGRAFT;  Surgeon: Albertine Patricia, DPM;  Location: [facility] ORS;  Service: Podiatry;  Laterality: Left;  necrotic,gangrene   IRRIGATION AND DEBRIDEMENT FOOT Left 12/09/2016   Procedure: IRRIGATION AND DEBRIDEMENT FOOT-MUSCLE/FASCIA, RESECTION OF FOURTH AND FIFTH METATARSAL NECROTIC BONE;  Surgeon: Albertine Patricia, DPM;  Location:  [facility] ORS;  Service: Podiatry;  Laterality: Left;   IRRIGATION AND DEBRIDEMENT FOOT Right 12/23/2018   Procedure: IRRIGATION AND DEBRIDEMENT FOOT and wound vac application;  Surgeon: Samara Deist, DPM;  Location: [facility] ORS;  Service: Podiatry;  Laterality: Right;   LEFT HEART CATH N/A 06/25/2019   Procedure: Left Heart Cath and Coronary Angiography;  Surgeon: Isaias Cowman, MD;  Location: [place] CV LAB;  Service: Cardiovascular;  Laterality: N/A;   LEFT HEART CATH AND CORONARY ANGIOGRAPHY N/A 06/05/2018   Procedure: LEFT HEART CATH AND CORONARY ANGIOGRAPHY;  Surgeon: Yolonda Kida, MD;  Location: [place] CV LAB;  Service: Cardiovascular;  Laterality: N/A;   LOWER EXTREMITY ANGIOGRAPHY Left 08/16/2016   Procedure: Lower Extremity Angiography;  Surgeon: Algernon Huxley, MD;  Location: [place] CV LAB;  Service: Cardiovascular;  Laterality: Left;   LOWER EXTREMITY ANGIOGRAPHY Left 09/01/2016   Procedure: Lower Extremity Angiography;  Surgeon: Algernon Huxley, MD;  Location: [place] CV LAB;  Service: Cardiovascular;  Laterality: Left;   LOWER EXTREMITY ANGIOGRAPHY Left 10/14/2016   Procedure: Lower Extremity Angiography;  Surgeon: Algernon Huxley, MD;  Location: [place] CV LAB;  Service: Cardiovascular;  Laterality: Left;   LOWER EXTREMITY ANGIOGRAPHY Left 12/20/2016   Procedure: Lower Extremity Angiography;  Surgeon: Algernon Huxley, MD;  Location: [place] CV LAB;  Service: Cardiovascular;  Laterality: Left;   LOWER EXTREMITY ANGIOGRAPHY Right 12/18/2018   Procedure: LOWER EXTREMITY ANGIOGRAPHY;  Surgeon: Algernon Huxley,  MD;  Location: [place] CV LAB;  Service: Cardiovascular;  Laterality: Right;   LOWER EXTREMITY INTERVENTION  09/01/2016   Procedure: Lower Extremity Intervention;  Surgeon: Algernon Huxley, MD;  Location: [place] CV LAB;  Service: Cardiovascular;;   PROSTATE SURGERY     removal   TONSILLECTOMY     as a child    Prior to Admission  medications   Medication Sig Start Date End Date Taking? Authorizing Provider  acetaminophen (TYLENOL) 325 MG tablet Take 650 mg by mouth every 6 (six) hours as needed.    [provider]  amiodarone (PACERONE) 200 MG tablet Take 200 mg by mouth daily. 09/13/18   [provider]  aspirin EC 81 MG tablet Take 1 tablet (81 mg total) by mouth daily. 12/18/18   Algernon Huxley, MD  atorvastatin (LIPITOR) 80 MG tablet Take 80 mg by mouth every evening. 07/31/18   [provider]  bisacodyl (DULCOLAX) 5 MG EC tablet Take 1 tablet (5 mg total) by mouth daily as needed for moderate constipation. 01/05/19   [name], MD  chlorthalidone (HYGROTON) 25 MG tablet Take 25 mg by mouth daily.    [provider]  Cholecalciferol (VITAMIN D-3) 25 MCG (1000 UT) CAPS Take 1,000 Units by mouth daily at 6 (six) AM.     [provider]  divalproex (DEPAKOTE ER) 500 MG 24 hr tablet Take 1 tablet (500 mg total) by mouth daily. 02/16/19   Charlynne Cousins, MD  Ensure Max Protein (ENSURE MAX PROTEIN) LIQD Take 330 mLs (11 oz total) by mouth 2 (two) times daily. 01/05/19   [name], MD  ferrous sulfate 325 (65 FE) MG tablet Take 1 tablet (325 mg total) by mouth daily with breakfast. 10/18/18   [name] Mandes, MD  gabapentin (NEURONTIN) 300 MG capsule Take 300 mg by mouth 3 (three) times daily.  02/07/19   [provider]  insulin glargine (LANTUS) 100 UNIT/ML injection Inject 0.15 mLs (15 Units total) into the skin at bedtime. 10/18/18   [name] Mandes, MD  insulin lispro (HUMALOG) 100 UNIT/ML injection Inject 0.03 mLs (3 Units total) into the skin 3 (three) times daily with meals. Patient taking differently: Inject 3 Units into the skin 3 (three) times daily with meals. Plus 1 additional unit for every 50 BG points over 200 (max 5 additional units per dose) 11/28/17   Wieting, Richard, MD  isosorbide mononitrate (IMDUR) 30 MG 24 hr tablet Take 60 mg by mouth daily. 11/07/19   [provider]  metFORMIN (GLUCOPHAGE) 500 MG tablet Take 500 mg by mouth daily with breakfast.     [provider]  metoprolol tartrate (LOPRESSOR) 25 MG tablet Take 25 mg by mouth 3 (three) times daily.    [provider]  Multiple Vitamin (MULTIVITAMIN WITH MINERALS) TABS tablet Take 1 tablet by mouth daily. 01/05/19   [name], MD  nitroGLYCERIN (NITROSTAT) 0.4 MG SL tablet Place 1 tablet (0.4 mg total) under the tongue every 5 (five) minutes as needed for chest pain. 06/09/18   Hillary Bow, MD  potassium chloride (KLOR-CON) 20 MEQ packet Take by mouth 2 (two) times daily.    [provider]  Rivaroxaban (XARELTO) 15 MG TABS tablet Take 1 tablet (15 mg total) by mouth daily with supper. Hold Xarelto for 3 days.  Start on January 09, 2019. 01/09/19   [name], MD  senna (SENOKOT) 8.6 MG TABS tablet Take 1 tablet (8.6 mg total) by mouth  daily as needed for mild constipation. 01/05/19   [name], MD  TIADYLT ER 120 MG 24 hr capsule Take 120 mg by mouth daily. 10/30/19   [provider]  zinc sulfate 220 (50 Zn) MG capsule Take 220 mg by mouth daily.    [provider]    Allergies Contrast media [iodinated diagnostic agents], Iohexol, Metrizamide, and Latex  Family History  Problem Relation Age of Onset   Hypertension Mother    Diabetes Mother    Prostate cancer Neg Hx    Bladder Cancer Neg Hx    Kidney disease Neg Hx     Social History Social History   Tobacco Use   Smoking status: Former Smoker    Packs/day: 0.25    Types: Cigarettes    Quit date: 10/14/1994    Years since quitting: 25.4   Smokeless tobacco: Never Used  Vaping Use   Vaping Use: Never used  Substance Use Topics   Alcohol use: No    Alcohol/week: 0.0 standard drinks   Drug use: No    Review of Systems Constitutional: No fever/chills Eyes: No visual changes. ENT: No sore throat. Cardiovascular: Denies chest pain. Respiratory: Denies shortness of  breath. Gastrointestinal: No abdominal pain.  No nausea, no vomiting.  No diarrhea.  No constipation. Genitourinary: Negative for dysuria. Musculoskeletal: Negative for neck pain.  Negative for back pain. Integumentary: Negative for rash. Neurological: Negative for headaches, focal weakness or numbness.  Positive for altered mental status per EMS report  ____________________________________________   PHYSICAL EXAM:  VITAL SIGNS: ED Triage Vitals  Enc Vitals Group     BP 03/06/20 0059 (!) 166/74     Pulse Rate 03/06/20 0059 65     Resp 03/06/20 0059 (!) 22     Temp 03/06/20 0059 98.7 F (37.1 C)     Temp Source 03/06/20 0059 Oral     SpO2 03/06/20 0056 92 %     Weight --      Height --      Head Circumference --      Peak Flow --      Pain Score --      Pain Loc --      Pain Edu? --      Excl. in [place]? --     Constitutional: Alert and oriented.  Eyes: Conjunctivae are normal.  Head: Atraumatic. Ears:  Healthy appearing ear canals and TMs bilaterally Cardiovascular: Normal rate, regular rhythm. Good peripheral circulation. Grossly normal heart sounds. Respiratory: Normal respiratory effort.  No retractions. Gastrointestinal: Soft and nontender. No distention.  Musculoskeletal: No lower extremity tenderness nor edema. No gross deformities of extremities. Neurologic:  Normal speech and language. No gross focal neurologic deficits are appreciated.  Skin:  Skin is warm, dry and intact. Psychiatric: Mood and affect are normal. Speech and behavior are normal.  ____________________________________________   LABS (all labs ordered are listed, but only abnormal results are displayed)  Labs Reviewed  CBC - Abnormal; Notable for the following components:      Result Value   RBC 4.15 (*)    Hemoglobin 12.5 (*)    HCT 37.9 (*)    All other components within normal limits  COMPREHENSIVE METABOLIC PANEL - Abnormal; Notable for the following components:   Glucose, Bld 101 (*)     BUN 43 (*)    Creatinine, Ser 1.54 (*)    Calcium 8.5 (*)    Albumin 3.4 (*)    GFR, Estimated 41 (*)  All other components within normal limits  URINALYSIS, COMPLETE (UACMP) WITH MICROSCOPIC - Abnormal; Notable for the following components:   Color, Urine YELLOW (*)    APPearance CLOUDY (*)    Hgb urine dipstick LARGE (*)    Protein, ur 100 (*)    Leukocytes,Ua LARGE (*)    RBC / HPF >50 (*)    WBC, UA >50 (*)    Bacteria, UA MANY (*)    All other components within normal limits  TROPONIN I (HIGH SENSITIVITY) - Abnormal; Notable for the following components:   Troponin I (High Sensitivity) 31 (*)    All other components within normal limits  TROPONIN I (HIGH SENSITIVITY) - Abnormal; Notable for the following components:   Troponin I (High Sensitivity) 32 (*)    All other components within normal limits  RESPIRATORY PANEL BY RT PCR (FLU A&B, COVID)  URINE CULTURE  HEMOGLOBIN A1C   _______________________________________  RADIOLOGY I,  N Dorann Davidson, personally viewed and evaluated these images (plain radiographs) as part of my medical decision making, as well as reviewing the written report by the radiologist.  ED MD interpretation: No acute intracranial abnormality noted on CT head per radiologist.  Official radiology report(s): CT Head Wo Contrast  Result Date: 03/06/2020 CLINICAL DATA:  Mental status change, unknown coughs. High blood pressure. EXAM: CT HEAD WITHOUT CONTRAST TECHNIQUE: Contiguous axial images were obtained from the base of the skull through the vertex without intravenous contrast. COMPARISON:  CT head 11/30/2019 FINDINGS: Brain: Cerebral ventricle sizes are concordant with the degree of cerebral volume loss. Patchy and confluent areas of decreased attenuation are noted throughout the deep and periventricular white matter of the cerebral hemispheres bilaterally, compatible with chronic microvascular ischemic disease. No evidence of large-territorial acute  infarction. No parenchymal hemorrhage. No mass lesion. No extra-axial collection. No mass effect or midline shift. No hydrocephalus. Basilar cisterns are patent. Vascular: No hyperdense vessel. Skull: No acute fracture or focal lesion. Sinuses/Orbits: Paranasal sinuses and mastoid air cells are clear. The orbits are unremarkable. Other: Atherosclerotic calcifications are present within the cavernous internal carotid arteries. IMPRESSION: No acute intracranial abnormality. Electronically Signed   By: Iven Finn M.D.   On: 03/06/2020 01:27     Procedures   ____________________________________________   INITIAL IMPRESSION / MDM / [place] / ED COURSE  As part of my medical decision making, I reviewed the following data within the [place] NUMBER  85 year old male presented with above-stated history and physical exam secondary to altered mental status.  Laboratory data notable for elevated creatinine of 1.54 with a BUN of 43.  Urinalysis findings consistent with a urinary tract infection.  CT head revealed no acute intracranial abnormality.  Patient given IV ceftriaxone 1 g.  Patient discussed with Dr. Damita Dunnings for hospital admission for further evaluation and management. ____________________________________________  FINAL CLINICAL IMPRESSION(S) / ED DIAGNOSES  Final diagnoses:  Acute cystitis with hematuria  Altered mental status, unspecified altered mental status type     MEDICATIONS GIVEN DURING THIS VISIT:  Medications  cefTRIAXone (ROCEPHIN) 1 g in sodium chloride 0.9 % 100 mL IVPB (1 g Intravenous New Bag/Given 03/06/20 0449)  Rivaroxaban (XARELTO) tablet 15 mg (has no administration in time range)  insulin aspart (novoLOG) injection 0-15 Units (has no administration in time range)  insulin aspart (novoLOG) injection 0-5 Units (has no administration in time range)  0.9 %  sodium chloride infusion ( Intravenous New Bag/Given 03/06/20 0453)  acetaminophen  (TYLENOL) tablet 650 mg (has  no administration in time range)    Or  acetaminophen (TYLENOL) suppository 650 mg (has no administration in time range)  ondansetron (ZOFRAN) tablet 4 mg (has no administration in time range)    Or  ondansetron (ZOFRAN) injection 4 mg (has no administration in time range)     ED Discharge Orders    None      *Please note:  [name] [name] was evaluated in Emergency Department on 03/06/2020 for the symptoms described in the history of present illness. He was evaluated in the context of the global COVID-19 pandemic, which necessitated consideration that the patient might be at risk for infection with the SARS-CoV-2 virus that causes COVID-19. Institutional protocols and algorithms that pertain to the evaluation of patients at risk for COVID-19 are in a state of rapid change based on information released by regulatory bodies including the CDC and federal and state organizations. These policies and algorithms were followed during the patient's care in the ED.  Some ED evaluations and interventions may be delayed as a result of limited staffing during and after the pandemic.*  Note:  This document was prepared using Dragon voice recognition software and may include unintentional dictation errors.   Gregor Hams, MD 03/06/20 [phone]

## 2020-03-06 NOTE — ED Notes (Signed)
Pt resting comfortably in bed. No acute distress noted. Respirations even and unlabored. Brief dry of incontinence.

## 2020-03-06 NOTE — ED Notes (Signed)
Peri care performed and pt brief changed. Pt had 1 occurrence of stool and urine.

## 2020-03-06 NOTE — ED Notes (Signed)
BS 52.  Pt sitting up, AAO x 3.  Eating breakfast, juice provided.  Will recheck CBG when meal complete.

## 2020-03-06 NOTE — ED Notes (Signed)
Pt resting at this time, NAD.

## 2020-03-06 NOTE — Progress Notes (Signed)
Patient ID: [name], male   DOB: 08-Sep-1934, 85 y.o.   MRN: [id number] This is no charge note as pt was admitted this am.   Hearl Heikes is a 85 y.o. male with medical history significant for A. fib on Xarelto HFpEF, diabetes mellitus with PVD status post bilateral BKA, history of COVID-19 pneumonia 01/2019, CAD, who presents to the emergency room by EMS with altered mental status. Patient is being treated for UTI.  Was hypoglycemic this a.m. was given food and OJ and still remained hypoglycemic.  I started patient on D5 half.  IV fluid.  Telemetry with heart rate from 30s to 40s at rest.  On amiodarone as outpatient for atrial fibrillation but not started here.  I consulted cardiology as patient may have tachybradycardia syndrome.

## 2020-03-06 NOTE — ED Triage Notes (Signed)
BIB [facility] from home, wife c/o AMS and high blood pressure. Pt appropriate with EMS.

## 2020-03-06 NOTE — Consult Note (Signed)
CARDIOLOGY CONSULT NOTE               Patient ID: [name] MRN: [id number] DOB/AGE: 12/15/1934 85 y.o.  Admit date: 03/06/2020 Referring Physician Dr. Nolberto Hanlon  Primary Physician [name]  Primary Cardiologist Dr. Nehemiah Massed  Reason for Consultation Bradycardia   HPI: [name] is an 85 year old male with a past medical history significant for coronary artery disease, treated medically, atrial fibrillation, anticoagulated with Xarelto, ischemic cardiomyopathy with most recent EF 50-55%, PVD s/p bilateral BKA, and type 2 diabetes who presented to the ED on 03/06/20 for AMS.  Per EMR notes, patient was in his normal state of health until a few hours prior to arrival where he was screaming and yelling, in which his wife called EMS.  Workup in the ED has been significant for head CT revealing no evidence of acute intracranial abnormalities, creatinine of 1.54, GFR of 41, high sensitivity troponin borderline elevated x 2, 31 and 32 respectively, UA revealing leukocytes, RBC and WBC, and COVID-19 negative.  HR fluctuating between upper 30s and lower 40s on telemetry which warranted cardiology consultation.   He is followed in outpatient cardiology by Dr. Nehemiah Massed. Echocardiogram on 11/30/19 revealed low normal LV systolic function with an EF estimated between 50-55% with no significant valvular disease.  Left heart catheterization on 06/25/19 revealed one vessel CAD with a chronically occluded ostial LAD with insignificant disease elsewhere.   03/06/20: On exam, [name] is difficult to arouse, but denies any pain.  HR staying in the low 40s - sinus bradycardia.   Review of systems complete and found to be negative unless listed above     Past Medical History:  Diagnosis Date  . Arthritis   . Atrial fibrillation ([place])   . Carcinoma of prostate ([place])   . CHF (congestive heart failure) ([place])   . Chronic kidney disease   . Diabetes mellitus without complication ([place])    . ED (erectile dysfunction)   . Frequent urination   . Hyperlipidemia   . Hypertension   . Moderate mitral insufficiency   . Peripheral vascular disease ([place])   . Pneumonia 04/2016  . Prostate cancer ([place])   . Sleep apnea    OSA--USE C-PAP  . Ulcer of left foot due to type 2 diabetes mellitus ([place])   . Urinary stress incontinence, male     Past Surgical History:  Procedure Laterality Date  . AMPUTATION Left 01/12/2017   Procedure: AMPUTATION BELOW KNEE;  Surgeon: Algernon Huxley, MD;  Location: [facility] ORS;  Service: General;  Laterality: Left;  . AMPUTATION Right 12/27/2018   Procedure: AMPUTATION BELOW KNEE;  Surgeon: Algernon Huxley, MD;  Location: [facility] ORS;  Service: General;  Laterality: Right;  . APLIGRAFT PLACEMENT Left 10/15/2016   Procedure: APLIGRAFT PLACEMENT;  Surgeon: Albertine Patricia, DPM;  Location: [facility];  Service: Podiatry;  Laterality: Left;  necrotic ulcer  . CHOLECYSTECTOMY    . COLONOSCOPY WITH PROPOFOL N/A 01/02/2019   Procedure: COLONOSCOPY WITH PROPOFOL;  Surgeon: Lucilla Lame, MD;  Location: [facility] ENDOSCOPY;  Service: Endoscopy;  Laterality: N/A;  . COLONOSCOPY WITH PROPOFOL N/A 01/05/2019   Procedure: COLONOSCOPY WITH PROPOFOL;  Surgeon: Lucilla Lame, MD;  Location: [facility] ENDOSCOPY;  Service: Endoscopy;  Laterality: N/A;  . ESOPHAGOGASTRODUODENOSCOPY (EGD) WITH PROPOFOL N/A 01/02/2019   Procedure: ESOPHAGOGASTRODUODENOSCOPY (EGD) WITH PROPOFOL;  Surgeon: Lucilla Lame, MD;  Location: [facility] ENDOSCOPY;  Service: Endoscopy;  Laterality: N/A;  . EYE SURGERY Bilateral    Cataract Extraction  with IOL  . HIP ARTHROPLASTY Right 10/13/2018   Procedure: ARTHROPLASTY BIPOLAR HIP (HEMIARTHROPLASTY);  Surgeon: Earnestine Leys, MD;  Location: [facility] ORS;  Service: Orthopedics;  Laterality: Right;  . IRRIGATION AND DEBRIDEMENT FOOT Left 10/15/2016   Procedure: IRRIGATION AND DEBRIDEMENT FOOT-EXCISIONAL DEBRIDEMENT OF SKIN 3RD, 4TH AND 5TH TOES WITH APPLICATION OF APLIGRAFT;  Surgeon:  Albertine Patricia, DPM;  Location: [facility] OR[facility];  Service: Podiatry;  Laterality: Left;  necrotic,gangrene  . IRRIGATION AND DEBRIDEMENT FOOT Left 12/09/2016   Procedure: IRRIGATION AND DEBRIDEMENT FOOT-MUSCLE/FASCIA, RESECTION OF FOURTH AND FIFTH METATARSAL NECROTIC BONE;  Surgeon: Albertine Patricia, DPM;  Location: [facility];  Service: Podiatry;  Laterality: Left;  . IRRIGATION AND DEBRIDEMENT FOOT Right 12/23/2018   Procedure: IRRIGATION AND DEBRIDEMENT FOOT and wound vac application;  Surgeon: Samara Deist, DPM;  Location: [facility];  Service: Podiatry;  Laterality: Right;  . LEFT HEART CATH N/A 06/25/2019   Procedure: Left Heart Cath and Coronary Angiography;  Surgeon: Isaias Cowman, MD;  Location: [place] CV LAB;  Service: Cardiovascular;  Laterality: N/A;  . LEFT HEART CATH AND CORONARY ANGIOGRAPHY N/A 06/05/2018   Procedure: LEFT HEART CATH AND CORONARY ANGIOGRAPHY;  Surgeon: Yolonda Kida, MD;  Location: [place] [facility];  Service: Cardiovascular;  Laterality: N/A;  . LOWER EXTREMITY ANGIOGRAPHY Left 08/16/2016   Procedure: Lower Extremity Angiography;  Surgeon: Algernon Huxley, MD;  Location: [place] [facility];  Service: Cardiovascular;  Laterality: Left;  . LOWER EXTREMITY ANGIOGRAPHY Left 09/01/2016   Procedure: Lower Extremity Angiography;  Surgeon: Algernon Huxley, MD;  Location: [place] [facility];  Service: Cardiovascular;  Laterality: Left;  . LOWER EXTREMITY ANGIOGRAPHY Left 10/14/2016   Procedure: Lower Extremity Angiography;  Surgeon: Algernon Huxley, MD;  Location: [place] [facility];  Service: Cardiovascular;  Laterality: Left;  . LOWER EXTREMITY ANGIOGRAPHY Left 12/20/2016   Procedure: Lower Extremity Angiography;  Surgeon: Algernon Huxley, MD;  Location: [place] [facility];  Service: Cardiovascular;  Laterality: Left;  . LOWER EXTREMITY ANGIOGRAPHY Right 12/18/2018   Procedure: LOWER EXTREMITY ANGIOGRAPHY;  Surgeon: Algernon Huxley, MD;  Location: [place] [facility];   Service: Cardiovascular;  Laterality: Right;  . LOWER EXTREMITY INTERVENTION  09/01/2016   Procedure: Lower Extremity Intervention;  Surgeon: Algernon Huxley, MD;  Location: [place] [facility];  Service: Cardiovascular;;  . PROSTATE SURGERY     removal  . TONSILLECTOMY     as a child    (Not in a hospital admission)  Social History   Socioeconomic History  . Marital status: Married    Spouse name: Not on file  . Number of children: Not on file  . Years of education: Not on file  . Highest education level: Not on file  Occupational History  . Occupation: retired  Tobacco Use  . Smoking status: Former Smoker    Packs/day: 0.25    Types: Cigarettes    Quit date: 10/14/1994    Years since quitting: 25.4  . Smokeless tobacco: Never Used  Vaping Use  . Vaping Use: Never used  Substance and Sexual Activity  . Alcohol use: No    Alcohol/week: 0.0 standard drinks  . Drug use: No  . Sexual activity: Not on file  Other Topics Concern  . Not on file  Social History Narrative   Lives at home with wife, has a wheelchair   Social Determinants of Health   Financial Resource Strain:   . Difficulty of Paying Living Expenses: Not on file  Food Insecurity:   . Worried About Charity fundraiser in the Last Year: Not on file  . Ran Out of Food in the Last Year: Not on file  Transportation Needs:   . Lack of Transportation (Medical): Not on file  . Lack of Transportation (Non-Medical): Not on file  Physical Activity:   . Days of Exercise per Week: Not on file  . Minutes of Exercise per Session: Not on file  Stress:   . Feeling of Stress : Not on file  Social Connections:   . Frequency of Communication with Friends and Family: Not on file  . Frequency of Social Gatherings with Friends and Family: Not on file  . Attends Religious Services: Not on file  . Active Member of Clubs or Organizations: Not on file  . Attends Archivist Meetings: Not on file  . Marital Status: Not on  file  Intimate Partner Violence:   . Fear of Current or Ex-Partner: Not on file  . Emotionally Abused: Not on file  . Physically Abused: Not on file  . Sexually Abused: Not on file    Family History  Problem Relation Age of Onset  . Hypertension Mother   . Diabetes Mother   . Prostate cancer Neg Hx   . Bladder Cancer Neg Hx   . Kidney disease Neg Hx       Review of systems complete and found to be negative unless listed above      PHYSICAL EXAM  General: Well developed, well nourished, in no acute distress HEENT:  Normocephalic and atramatic Neck:  No JVD.  Lungs: Clear bilaterally to auscultation and percussion. Heart: Bradycardic, regular rhythm . Normal S1 and S2 without gallops or murmurs.  Abdomen: Bowel sounds are positive, abdomen soft and non-tender  Msk:  Back normal. Normal strength and tone for age. Extremities: Bilateral BKA  Neuro: Difficult to arouse  Psych:  Difficult to arouse   Labs:   Lab Results  Component Value Date   WBC 10.5 03/06/2020   HGB 12.5 (L) 03/06/2020   HCT 37.9 (L) 03/06/2020   MCV 91.3 03/06/2020   PLT 258 03/06/2020    Recent Labs  Lab 03/06/20 0100  NA 144  K 4.3  CL 109  CO2 24  BUN 43*  CREATININE 1.54*  CALCIUM 8.5*  PROT 7.0  BILITOT 0.7  ALKPHOS 86  ALT 26  AST 32  GLUCOSE 101*   Lab Results  Component Value Date   CKTOTAL 131 10/22/2016   CKMB 1.2 01/19/2013   TROPONINI 0.26 (HH) 06/07/2018    Lab Results  Component Value Date   CHOL 253 (H) 11/30/2019   CHOL 121 06/25/2019   CHOL 146 06/06/2018   Lab Results  Component Value Date   HDL 34 (L) 06/25/2019   HDL 30 (L) 06/06/2018   HDL 39 (L) 10/25/2017   Lab Results  Component Value Date   LDLCALC 76 06/25/2019   LDLCALC 95 06/06/2018   LDLCALC 70 10/25/2017   Lab Results  Component Value Date   TRIG 140 12/06/2019   TRIG 147 12/03/2019   TRIG 174 (H) 11/30/2019   Lab Results  Component Value Date   CHOLHDL 3.6 06/25/2019   CHOLHDL  4.9 06/06/2018   CHOLHDL 3.1 10/25/2017   No results found for: LDLDIRECT    Radiology: CT Head Wo Contrast  Result Date: 03/06/2020 CLINICAL DATA:  Mental status change, unknown coughs. High blood pressure. EXAM: CT HEAD WITHOUT CONTRAST TECHNIQUE:  Contiguous axial images were obtained from the base of the skull through the vertex without intravenous contrast. COMPARISON:  CT head 11/30/2019 FINDINGS: Brain: Cerebral ventricle sizes are concordant with the degree of cerebral volume loss. Patchy and confluent areas of decreased attenuation are noted throughout the deep and periventricular white matter of the cerebral hemispheres bilaterally, compatible with chronic microvascular ischemic disease. No evidence of large-territorial acute infarction. No parenchymal hemorrhage. No mass lesion. No extra-axial collection. No mass effect or midline shift. No hydrocephalus. Basilar cisterns are patent. Vascular: No hyperdense vessel. Skull: No acute fracture or focal lesion. Sinuses/Orbits: Paranasal sinuses and mastoid air cells are clear. The orbits are unremarkable. Other: Atherosclerotic calcifications are present within the cavernous internal carotid arteries. IMPRESSION: No acute intracranial abnormality. Electronically Signed   By: Iven Finn M.D.   On: 03/06/2020 01:27    EKG: Sinus bradycardia   ASSESSMENT AND PLAN:  1.  Bradycardia   -Currently in sinus bradycardia, rate in the low 40s   -Will hold metoprolol and amiodarone and continue to closely follow   2.  Atrial fibrillation   -Currently in sinus bradycardia  -Will hold metoprolol and amiodarone   -Continue Xarelto 15mg    3.  Coronary artery disease   -With recent LHC in February 2021, treated medically   -Continue aspirin, atorvastatin, and Xarelto   4.  Type 2 Diabetes   -Continue SSI   5.  UTI   -Agree with abx treatment   6.  AKI  -Will hold chlorthalidone for now and resume when able   The history, physical exam  findings, and plan of care were all discussed with Dr. Bartholome Bill, and all decision making was made in collaboration.   Signed: Avie Arenas PA-C 03/06/2020, 12:47 PM

## 2020-03-06 NOTE — ED Notes (Signed)
MPOA - son, [name]:  [phone] Daughter, Joseph Art:  [phone]

## 2020-03-07 DIAGNOSIS — G9341 Metabolic encephalopathy: Secondary | ICD-10-CM | POA: Diagnosis not present

## 2020-03-07 LAB — BASIC METABOLIC PANEL
Anion gap: 9 (ref 5–15)
BUN: 35 mg/dL — ABNORMAL HIGH (ref 8–23)
CO2: 28 mmol/L (ref 22–32)
Calcium: 8.5 mg/dL — ABNORMAL LOW (ref 8.9–10.3)
Chloride: 105 mmol/L (ref 98–111)
Creatinine, Ser: 1.4 mg/dL — ABNORMAL HIGH (ref 0.61–1.24)
GFR, Estimated: 46 mL/min — ABNORMAL LOW (ref >60)
Glucose, Bld: 123 mg/dL — ABNORMAL HIGH (ref 70–99)
Potassium: 4 mmol/L (ref 3.5–5.1)
Sodium: 142 mmol/L (ref 135–145)

## 2020-03-07 LAB — URINE CULTURE

## 2020-03-07 LAB — GLUCOSE, CAPILLARY
Glucose-Capillary: 102 mg/dL — ABNORMAL HIGH (ref 70–99)
Glucose-Capillary: 148 mg/dL — ABNORMAL HIGH (ref 70–99)
Glucose-Capillary: 155 mg/dL — ABNORMAL HIGH (ref 70–99)

## 2020-03-07 LAB — VALPROIC ACID LEVEL: Valproic Acid Lvl: <10 ug/mL — ABNORMAL LOW (ref 50.0–100.0)

## 2020-03-07 LAB — HEMOGLOBIN A1C
Hgb A1c MFr Bld: 8 % — ABNORMAL HIGH (ref 4.8–5.6)
Mean Plasma Glucose: 183 mg/dL

## 2020-03-07 LAB — TSH: TSH: 2.725 u[IU]/mL (ref 0.350–4.500)

## 2020-03-07 MED ORDER — AMLODIPINE BESYLATE 5 MG PO TABS
5.0000 mg | ORAL_TABLET | Freq: Every day | ORAL | Status: DC
  Administered 2020-03-07 – 2020-03-08 (×2): 5 mg via ORAL
  Filled 2020-03-07 (×2): qty 1

## 2020-03-07 MED ORDER — ISOSORBIDE MONONITRATE ER 30 MG PO TB24
60.0000 mg | ORAL_TABLET | Freq: Every day | ORAL | Status: DC
  Administered 2020-03-07 – 2020-03-08 (×2): 60 mg via ORAL
  Filled 2020-03-07 (×2): qty 2

## 2020-03-07 MED ORDER — HYDRALAZINE HCL 20 MG/ML IJ SOLN
10.0000 mg | Freq: Four times a day (QID) | INTRAMUSCULAR | Status: DC | PRN
  Administered 2020-03-08: 10 mg via INTRAVENOUS
  Filled 2020-03-07: qty 1

## 2020-03-07 MED ORDER — DIVALPROEX SODIUM 500 MG PO DR TAB
500.0000 mg | DELAYED_RELEASE_TABLET | Freq: Every day | ORAL | Status: DC
  Administered 2020-03-07: 500 mg via ORAL
  Filled 2020-03-07 (×2): qty 1

## 2020-03-07 NOTE — Progress Notes (Signed)
Patient Name: [name] Date of Encounter: 03/07/2020  Hospital Problem List     Principal Problem:   Acute metabolic encephalopathy Active Problems:   History of prostate cancer   Type 2 diabetes mellitus treated with insulin ([place])   Essential hypertension   PAD (peripheral artery disease) (HCC)   Atrial fibrillation, chronic (HCC)   AKI (acute kidney injury) ([place])   S/P BKA (below knee amputation) unilateral, left (HCC)   Below-knee amputation of right lower extremity (HCC)   Hx of BKA, right (HCC)   Paroxysmal A-fib (HCC)   Seizures (HCC)   CAD (coronary artery disease)   Personal history of COVID-19   UTI (urinary tract infection)   (HFpEF) heart failure with preserved ejection fraction [facility])    Patient Profile     85 year old male with a past medical history significant for coronary artery disease, treated medically, atrial fibrillation, anticoagulated with Xarelto, ischemic cardiomyopathy with most recent EF 50-55%, PVD s/p bilateral BKA, and type 2 diabetes who presented to the ED on 03/06/20 for AMS.  Per EMR notes, patient was in his normal state of health until a few hours prior to arrival where he was screaming and yelling, in which his wife called EMS.  Workup in the ED has been significant for head CT revealing no evidence of acute intracranial abnormalities, creatinine of 1.54, GFR of 41, high sensitivity troponin borderline elevated x 2, 31 and 32 respectively, UA revealing leukocytes, RBC and WBC, and COVID-19 negative.  Patient with a significantly bradycardic on presentation. Subjective   Much more alert this morning.  Heart rate in the high 50s low 60s.  Denies chest pain or shortness of breath.  Inpatient Medications    . aspirin EC  81 mg Oral Daily  . atorvastatin  80 mg Oral QPM  . insulin aspart  0-15 Units Subcutaneous TID WC  . insulin aspart  0-5 Units Subcutaneous QHS  . Rivaroxaban  15 mg Oral Q supper    Vital Signs    Vitals:    03/06/20 2100 03/06/20 2215 03/06/20 2330 03/07/20 0320  BP: (!) 177/51 (!) 171/77 (!) 173/59 (!) 173/58  Pulse: (!) 44 (!) 47 (!) 43 (!) 41  Resp: 17 18 20 16   Temp:   97.9 F (36.6 C) 97.9 F (36.6 C)  TempSrc:      SpO2: 99% 100% 98% 98%    Intake/Output Summary (Last 24 hours) at 03/07/2020 0714 Last data filed at 03/07/2020 0316 Gross per 24 hour  Intake 1631.18 ml  Output --  Net 1631.18 ml   There were no vitals filed for this visit.  Physical Exam    GEN: Well nourished, well developed, in no acute distress.  HEENT: normal.  Neck: Supple, no JVD, carotid bruits, or masses. Cardiac: Bradycardic.  Radials/DP/PT 2+ status post right BKA.  Status post left BKA. Respiratory:  Respirations regular and unlabored, clear to auscultation bilaterally. GI: Soft, nontender, nondistended, BS + x 4. MS: no deformity or atrophy. Skin: warm and dry, no rash. Neuro:  Strength and sensation are intact. Psych: Normal affect.  Labs    CBC Recent Labs    03/06/20 0100  WBC 10.5  HGB 12.5*  HCT 37.9*  MCV 91.3  PLT 657   Basic Metabolic Panel Recent Labs    03/06/20 0100 03/07/20 0437  NA 144 142  K 4.3 4.0  CL 109 105  CO2 24 28  GLUCOSE 101* 123*  BUN 43* 35*  CREATININE 1.54* 1.40*  CALCIUM 8.5* 8.5*   Liver Function Tests Recent Labs    03/06/20 0100  AST 32  ALT 26  ALKPHOS 86  BILITOT 0.7  PROT 7.0  ALBUMIN 3.4*   No results for input(s): LIPASE, AMYLASE in the last 72 hours. Cardiac Enzymes No results for input(s): CKTOTAL, CKMB, CKMBINDEX, TROPONINI in the last 72 hours. BNP No results for input(s): BNP in the last 72 hours. D-Dimer No results for input(s): DDIMER in the last 72 hours. Hemoglobin A1C Recent Labs    03/06/20 0720  HGBA1C 8.0*   Fasting Lipid Panel No results for input(s): CHOL, HDL, LDLCALC, TRIG, CHOLHDL, LDLDIRECT in the last 72 hours. Thyroid Function Tests Recent Labs    03/07/20 0437  TSH 2.725    Telemetry     Currently sinus bradycardia  ECG    Atrial fibrillation with slow ventricular response.  No ischemia.  Radiology    CT Head Wo Contrast  Result Date: 03/06/2020 CLINICAL DATA:  Mental status change, unknown coughs. High blood pressure. EXAM: CT HEAD WITHOUT CONTRAST TECHNIQUE: Contiguous axial images were obtained from the base of the skull through the vertex without intravenous contrast. COMPARISON:  CT head 11/30/2019 FINDINGS: Brain: Cerebral ventricle sizes are concordant with the degree of cerebral volume loss. Patchy and confluent areas of decreased attenuation are noted throughout the deep and periventricular white matter of the cerebral hemispheres bilaterally, compatible with chronic microvascular ischemic disease. No evidence of large-territorial acute infarction. No parenchymal hemorrhage. No mass lesion. No extra-axial collection. No mass effect or midline shift. No hydrocephalus. Basilar cisterns are patent. Vascular: No hyperdense vessel. Skull: No acute fracture or focal lesion. Sinuses/Orbits: Paranasal sinuses and mastoid air cells are clear. The orbits are unremarkable. Other: Atherosclerotic calcifications are present within the cavernous internal carotid arteries. IMPRESSION: No acute intracranial abnormality. Electronically Signed   By: Iven Finn M.D.   On: 03/06/2020 01:27   US RENAL  Result Date: 03/06/2020 CLINICAL DATA:  Renal failure EXAM: RENAL / URINARY TRACT ULTRASOUND COMPLETE COMPARISON:  11/30/2019 CT FINDINGS: Right Kidney: Renal measurements: 10.5 x 5.6 x 5.5 cm = volume: 169 mL. Echogenicity within normal limits. 9 mm echogenic, shadowing calcification within the upper pole. No mass or hydronephrosis visualized. Left Kidney: Renal measurements: 9.1 x 5.4 x 4.4 cm = volume: 114 mL. Echogenicity within normal limits. 11 mm echogenic shadowing calcification within the lower pole. No mass or hydronephrosis visualized. Bladder: Urinary bladder wall appears  diffusely thickened measuring 6 mm in diameter. Other: None. IMPRESSION: 1. No evidence of obstructive uropathy. 2. Echogenic, shadowing foci within each kidney which could reflect nonobstructing stones or vascular calcifications. 3. Mild circumferential thickening of the urinary bladder wall which could reflect cystitis or bladder outlet obstruction. Correlate with urinalysis. Electronically Signed   By: Davina Poke D.O.   On: 03/06/2020 15:29    Assessment & Plan    1.  Bradycardia              -Currently in sinus bradycardia, rate in the upper 50s and lower 60s              -Will continue to  hold metoprolol and amiodarone and continue to closely follow rate.   2.  Atrial fibrillation              -Currently in sinus bradycardia             -Will hold metoprolol and amiodarone as per above.              -  Continue Xarelto 15mg    3.  Coronary artery disease              -With recent LHC in February 2021, treated medically Had occluded lad with collaterals. Has ruled out for mi.              -Continue aspirin, atorvastatin, and Xarelto   4.  Type 2 Diabetes              -Continue SSI   5.  UTI              -Agree with abx treatment   6.  AKI             -Creatinine improved. Baseline creatinine appears to be between 1.1 and 1.2. Will remain off of chlorthalidone for now.   7. HTN  -Blood pressure elevated presently likely secondary to holding metoprolol and chlorthalidone.  Will add amlodipine at 5 mg daily to begin improving this.  Would remain off of AV nodal drugs for now.    Signed, Javier Docker Ezmae Speers MD 03/07/2020, 7:14 AM  Pager: [phone]

## 2020-03-07 NOTE — Progress Notes (Signed)
Sepsis screen performed and is negative. Will continue to monitor. 

## 2020-03-07 NOTE — TOC Initial Note (Signed)
Transition of Care [facility]) - Initial/Assessment Note    Patient Details  Name: [name] MRN: [id number] Date of Birth: [birth year]  Transition of Care [facility]) CM/SW Contact:    Jesse Chroman, LCSW Phone Number: 03/07/2020, 12:06 PM  Clinical Narrative:  Readmission prevention screen complete. Patient not fully oriented. CSW called patient's son, introduced role, and explained that discharge planning would be discussed. PCP is Jesse Arthurs, NP at [facility] in [place]. Son transports to appointments. [facility] [place]. No issues obtaining medications. Patient is active with [facility] for RN, PT, and OT. He has all needed equipment at home. Patient is not on oxygen at home. On 2 L acute at this time. No further concerns. CSW encouraged patient's son to contact CSW as needed. CSW will continue to follow patient and his son for support and facilitate return home when stable.                Expected Discharge Plan: [place] Barriers to Discharge: Continued Medical Work up   Patient Goals and CMS Choice     Choice offered to / list presented to : NA  Expected Discharge Plan and Services Expected Discharge Plan: [place] Acute Care Choice: Resumption of Svcs/PTA Provider Living arrangements for the past 2 months: Single Family Home                                      Prior Living Arrangements/Services Living arrangements for the past 2 months: Single Family Home Lives with:: Spouse Patient language and need for interpreter reviewed:: Yes Do you feel safe going back to the place where you live?: Yes      Need for Family Participation in Patient Care: Yes (Comment) Care giver support system in place?: Yes (comment) Current home services: DME, Home OT, Home PT, Home RN Criminal Activity/Legal Involvement Pertinent to Current Situation/Hospitalization: No - Comment as needed  Activities of Daily  Living Home Assistive Devices/Equipment: Wheelchair ADL Screening (condition at time of admission) Patient's cognitive ability adequate to safely complete daily activities?: Yes Is the patient deaf or have difficulty hearing?: Yes Does the patient have difficulty seeing, even when wearing glasses/contacts?: No Does the patient have difficulty concentrating, remembering, or making decisions?: Yes Patient able to express need for assistance with ADLs?: Yes Does the patient have difficulty dressing or bathing?: No Independently performs ADLs?: No Communication: Independent Dressing (OT): Dependent Is this a change from baseline?: Pre-admission baseline Grooming: Dependent Is this a change from baseline?: Pre-admission baseline Feeding: Independent Bathing: Dependent Is this a change from baseline?: Pre-admission baseline Toileting: Dependent Is this a change from baseline?: Pre-admission baseline In/Out Bed: Dependent Is this a change from baseline?: Pre-admission baseline Walks in Home: Dependent Is this a change from baseline?: Pre-admission baseline Does the patient have difficulty walking or climbing stairs?: Yes Weakness of Legs: Both Weakness of Arms/Hands: None  Permission Sought/Granted Permission sought to share information with : Facility Sport and exercise psychologist, Family Supports    Share Information with NAME: [name]  Permission granted to share info w AGENCY: [facility]  Permission granted to share info w Relationship: Son  Permission granted to share info w Contact Information: [phone]  Emotional Assessment Appearance:: Appears stated age Attitude/Demeanor/Rapport: Unable to Assess Affect (typically observed): Unable to Assess Orientation: : Oriented to Self, Oriented to Place,  Oriented to  Time Alcohol / Substance Use: Not Applicable Psych Involvement: No (comment)  Admission diagnosis:  UTI (urinary tract infection) [N39.0] Renal failure  [N19] Acute cystitis with hematuria [N30.01] Altered mental status, unspecified altered mental status type [R41.82] Patient Active Problem List   Diagnosis Date Noted  . CAD (coronary artery disease) 03/06/2020  . Personal history of COVID-19 03/06/2020  . Acute metabolic encephalopathy 76/16/0737  . UTI (urinary tract infection) 03/06/2020  . (HFpEF) heart failure with preserved ejection fraction ([place]) 03/06/2020  . Goals of care, counseling/discussion   . Palliative care by specialist   . Seizures ([place]) 11/30/2019  . ACS (acute coronary syndrome) ([place]) 06/24/2019  . Bradycardia 05/16/2019  . Paroxysmal A-fib ([place]) 03/27/2019  . Hx of BKA, right ([place]) 03/02/2019  . Severe protein-calorie malnutrition ([place]) 02/16/2019  . Acute systolic CHF (congestive heart failure) ([place]) 02/16/2019  . Acute respiratory failure with hypoxia ([place]) 02/08/2019  . Pneumonia due to COVID-19 virus 02/08/2019  . Below-knee amputation of right lower extremity ([place]) 01/15/2019  . Polyp of descending colon   . Iron deficiency anemia due to chronic blood loss   . Sacral decubitus ulcer, stage II ([place]) 12/26/2018  . Acute respiratory failure ([place]) 12/20/2018  . Femur fracture ([place]) 10/13/2018  . Chest pain 06/03/2018  . NSTEMI (non-ST elevated myocardial infarction) ([place]) 01/16/2018  . Acute CHF (congestive heart failure) ([place]) 11/23/2017  . Atrial fibrillation with rapid ventricular response ([place]) 10/15/2017  . BKA stump complication ([place]) 10/62/6948  . 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 S/P BKA (below knee amputation) unilateral, left ([place]) 02/01/2017  . Chronic diastolic heart failure ([place]) 01/21/2017  . Pressure injury of skin 01/18/2017  . Atherosclerosis of artery of extremity with gangrene ([place]) 01/05/2017  . Diabetic foot ulcer ([place]) 12/29/2016  . Ataxia 10/21/2016  . Atherosclerosis of native arteries of the extremities with ulceration ([place]) 10/12/2016  .  History of femoral angiogram 09/06/2016  . Left leg pain 09/06/2016  . Leukocytosis 09/06/2016  . Generalized weakness 09/06/2016  . AKI (acute kidney injury) ([place]) 09/06/2016  . Atrial fibrillation, chronic ([place]) 08/30/2016  . Pneumonia 05/19/2016  . Hyperlipidemia 04/20/2016  . Back pain 04/19/2016  . Type 2 diabetes mellitus treated with insulin ([place]) 04/19/2016  . Essential hypertension 04/19/2016  . PAD (peripheral artery disease) ([place]) 04/19/2016  . Erectile dysfunction following radical prostatectomy 06/25/2015  . History of prostate cancer 12/23/2014  . Incontinence 12/23/2014   PCP:  Jesse Arthurs, NP Pharmacy:   [facility] [address], [place] - [place] [address] Phone: [phone] Fax: [phone]  [facility] [place], [place] [address] [place] [address] Phone: [phone] Fax: [phone]  [place], [place] - [address] [facility]. [address] [place] [address] Phone: [phone] Fax: [phone]  [id number] [place], [place] [address] Phone: [phone] Fax: [phone]     Social Determinants of Health (SDOH) Interventions    Readmission Risk Interventions Readmission Risk Prevention Plan 03/07/2020 02/12/2019 12/25/2018  Transportation Screening Complete - Complete  PCP or Specialist Appt within 3-5 Days Complete - Complete  HRI or Home Care Consult Complete - Complete  Social Work Consult for [place] Planning/Counseling Complete - Complete  Palliative Care Screening Not Applicable - Not Applicable  Medication Review Press photographer)  Complete - Complete  PCP or Specialist appointment within 3-5 days of discharge - Not Complete -  PCP/Specialist Appt Not Complete comments - not ready for dc, family requests SNF placement -  HRI or Home  Care Consult - Complete -  SW Recovery Care/Counseling Consult - Complete -  Palliative Care Screening - Not Applicable -  [place] - Complete -  Some recent data might be hidden

## 2020-03-07 NOTE — Progress Notes (Addendum)
PROGRESS NOTE    [name]  GGE:[id number] DOB: 08-08-1934 DOA: 03/06/2020 PCP: [name], NP    Brief Narrative:  Pt was admitted for altered MS. Being tx for UTI. Found with sinus bradycardia. Beta blk and amiodarone stopped here.  Being tx for UTI  10/15- tele with sinus bradycardia  Consultants:   cardiology  Procedures:   Antimicrobials:   rocephin    Subjective:  Feeling better. Tells me no dizzinesslightheadedness.  Objective: Vitals:   03/07/20 1108 03/07/20 1136 03/07/20 1616 03/07/20 1919  BP:  (!) 158/81 (!) 149/67 (!) 154/73  Pulse:  (!) 48 (!) 50 (!) 55  Resp:  16 16 20   Temp:  97.7 F (36.5 C) 97.6 F (36.4 C) 98 F (36.7 C)  TempSrc:    Oral  SpO2: 96% 98% 92% 92%    Intake/Output Summary (Last 24 hours) at 03/07/2020 2047 Last data filed at 03/07/2020 1500 Gross per 24 hour  Intake 1786.17 ml  Output --  Net 1786.17 ml   There were no vitals filed for this visit.  Examination:  General exam: Appears calm and comfortable  Respiratory system: Clear to auscultation. Respiratory effort normal. Cardiovascular system: S1 & S2 heard,bradycardic. No JVD, murmurs, rubs, gallops or clicks. No pedal edema. Gastrointestinal system: Abdomen is nondistended, soft and nontender. No organomegaly or masses felt. Normal bowel sounds heard. Central nervous system: aaxo2, not to date Extremities: b/l amputation Skin: warm dry Psychiatry: . Mood & affect appropriate.     Data Reviewed: I have personally reviewed following labs and imaging studies  CBC: Recent Labs  Lab 03/06/20 0100  WBC 10.5  HGB 12.5*  HCT 37.9*  MCV 91.3  PLT 465   Basic Metabolic Panel: Recent Labs  Lab 03/06/20 0100 03/07/20 0437  NA 144 142  K 4.3 4.0  CL 109 105  CO2 24 28  GLUCOSE 101* 123*  BUN 43* 35*  CREATININE 1.54* 1.40*  CALCIUM 8.5* 8.5*   GFR: CrCl cannot be calculated (Unknown ideal weight.). Liver Function Tests: Recent Labs   Lab 03/06/20 0100  AST 32  ALT 26  ALKPHOS 86  BILITOT 0.7  PROT 7.0  ALBUMIN 3.4*   No results for input(s): LIPASE, AMYLASE in the last 168 hours. No results for input(s): AMMONIA in the last 168 hours. Coagulation Profile: No results for input(s): INR, PROTIME in the last 168 hours. Cardiac Enzymes: No results for input(s): CKTOTAL, CKMB, CKMBINDEX, TROPONINI in the last 168 hours. BNP (last 3 results) No results for input(s): PROBNP in the last 8760 hours. HbA1C: Recent Labs    03/06/20 0720  HGBA1C 8.0*   CBG: Recent Labs  Lab 03/06/20 2149 03/06/20 2336 03/07/20 0744 03/07/20 1133 03/07/20 1758  GLUCAP 71 124* 102* 148* 155*   Lipid Profile: No results for input(s): CHOL, HDL, LDLCALC, TRIG, CHOLHDL, LDLDIRECT in the last 72 hours. Thyroid Function Tests: Recent Labs    03/07/20 0437  TSH 2.725   Anemia Panel: No results for input(s): VITAMINB12, FOLATE, FERRITIN, TIBC, IRON, RETICCTPCT in the last 72 hours. Sepsis Labs: No results for input(s): PROCALCITON, LATICACIDVEN in the last 168 hours.  Recent Results (from the past 240 hour(s))  Urine culture     Status: Abnormal   Collection Time: 03/06/20  1:01 AM   Specimen: Urine, Random  Result Value Ref Range Status   Specimen Description   Final    URINE, RANDOM Performed at [facility], [address] [place], [place] [address]  Special Requests   Final    NONE Performed at [facility], [place] [place], [place] [address]    Culture MULTIPLE SPECIES PRESENT, SUGGEST RECOLLECTION (A)  Final   Report Status 03/07/2020 FINAL  Final  Respiratory Panel by RT PCR (Flu A&B, Covid) - Nasopharyngeal Swab     Status: None   Collection Time: 03/06/20  1:01 AM   Specimen: Nasopharyngeal Swab  Result Value Ref Range Status   SARS Coronavirus 2 by RT PCR NEGATIVE NEGATIVE Final    Comment: (NOTE) SARS-CoV-2 target nucleic acids are NOT DETECTED.  The SARS-CoV-2 RNA is  generally detectable in upper respiratoy specimens during the acute phase of infection. The lowest concentration of SARS-CoV-2 viral copies this assay can detect is 131 copies/mL. A negative result does not preclude SARS-Cov-2 infection and should not be used as the sole basis for treatment or other patient management decisions. A negative result may occur with  improper specimen collection/handling, submission of specimen other than nasopharyngeal swab, presence of viral mutation(s) within the areas targeted by this assay, and inadequate number of viral copies (<131 copies/mL). A negative result must be combined with clinical observations, patient history, and epidemiological information. The expected result is Negative.  Fact Sheet for Patients:  PinkCheek.be  Fact Sheet for Healthcare Providers:  GravelBags.it  This test is no t yet approved or cleared by the [place] FDA and  has been authorized for detection and/or diagnosis of SARS-CoV-2 by FDA under an Emergency Use Authorization (EUA). This EUA will remain  in effect (meaning this test can be used) for the duration of the COVID-19 declaration under Section 564(b)(1) of the Act, 21 U.S.C. section 360bbb-3(b)(1), unless the authorization is terminated or revoked sooner.     Influenza A by PCR NEGATIVE NEGATIVE Final   Influenza B by PCR NEGATIVE NEGATIVE Final    Comment: (NOTE) The Xpert Xpress SARS-CoV-2/FLU/RSV assay is intended as an aid in  the diagnosis of influenza from Nasopharyngeal swab specimens and  should not be used as a sole basis for treatment. Nasal washings and  aspirates are unacceptable for Xpert Xpress SARS-CoV-2/FLU/RSV  testing.  Fact Sheet for Patients: PinkCheek.be  Fact Sheet for Healthcare Providers: GravelBags.it  This test is not yet approved or cleared by the [place] FDA and  has been authorized for detection and/or diagnosis of SARS-CoV-2 by  FDA under an Emergency Use Authorization (EUA). This EUA will remain  in effect (meaning this test can be used) for the duration of the  Covid-19 declaration under Section 564(b)(1) of the Act, 21  U.S.C. section 360bbb-3(b)(1), unless the authorization is  terminated or revoked. Performed at [facility], [address] [place], [place] [address]          Radiology Studies: CT Head Wo Contrast  Result Date: 03/06/2020 CLINICAL DATA:  Mental status change, unknown coughs. High blood pressure. EXAM: CT HEAD WITHOUT CONTRAST TECHNIQUE: Contiguous axial images were obtained from the base of the skull through the vertex without intravenous contrast. COMPARISON:  CT head 11/30/2019 FINDINGS: Brain: Cerebral ventricle sizes are concordant with the degree of cerebral volume loss. Patchy and confluent areas of decreased attenuation are noted throughout the deep and periventricular white matter of the cerebral hemispheres bilaterally, compatible with chronic microvascular ischemic disease. No evidence of large-territorial acute infarction. No parenchymal hemorrhage. No mass lesion. No extra-axial collection. No mass effect or midline shift. No hydrocephalus. Basilar cisterns are patent. Vascular: No hyperdense vessel. Skull:  No acute fracture or focal lesion. Sinuses/Orbits: Paranasal sinuses and mastoid air cells are clear. The orbits are unremarkable. Other: Atherosclerotic calcifications are present within the cavernous internal carotid arteries. IMPRESSION: No acute intracranial abnormality. Electronically Signed   By: Iven Finn M.D.   On: 03/06/2020 01:27   US RENAL  Result Date: 03/06/2020 CLINICAL DATA:  Renal failure EXAM: RENAL / URINARY TRACT ULTRASOUND COMPLETE COMPARISON:  11/30/2019 CT FINDINGS: Right Kidney: Renal measurements: 10.5 x 5.6 x 5.5 cm = volume: 169 mL. Echogenicity within  normal limits. 9 mm echogenic, shadowing calcification within the upper pole. No mass or hydronephrosis visualized. Left Kidney: Renal measurements: 9.1 x 5.4 x 4.4 cm = volume: 114 mL. Echogenicity within normal limits. 11 mm echogenic shadowing calcification within the lower pole. No mass or hydronephrosis visualized. Bladder: Urinary bladder wall appears diffusely thickened measuring 6 mm in diameter. Other: None. IMPRESSION: 1. No evidence of obstructive uropathy. 2. Echogenic, shadowing foci within each kidney which could reflect nonobstructing stones or vascular calcifications. 3. Mild circumferential thickening of the urinary bladder wall which could reflect cystitis or bladder outlet obstruction. Correlate with urinalysis. Electronically Signed   By: Davina Poke D.O.   On: 03/06/2020 15:29        Scheduled Meds: . amLODipine  5 mg Oral Daily  . aspirin EC  81 mg Oral Daily  . atorvastatin  80 mg Oral QPM  . divalproex  500 mg Oral QHS  . insulin aspart  0-15 Units Subcutaneous TID WC  . insulin aspart  0-5 Units Subcutaneous QHS  . isosorbide mononitrate  60 mg Oral Daily  . Rivaroxaban  15 mg Oral Q supper   Continuous Infusions: . cefTRIAXone (ROCEPHIN)  IV Stopped (03/07/20 0636)  . dextrose 5 % and 0.45% NaCl Stopped (03/07/20 1233)    Assessment & Plan:   Principal Problem:   Acute metabolic encephalopathy Active Problems:   History of prostate cancer   Type 2 diabetes mellitus treated with insulin (HCC)   Essential hypertension   PAD (peripheral artery disease) (HCC)   Atrial fibrillation, chronic (HCC)   AKI (acute kidney injury) (HCC)   S/P BKA (below knee amputation) unilateral, left (HCC)   Below-knee amputation of right lower extremity (HCC)   Hx of BKA, right (HCC)   Paroxysmal A-fib (HCC)   Seizures (HCC)   CAD (coronary artery disease)   Personal history of COVID-19   UTI (urinary tract infection)   (HFpEF) heart failure with preserved ejection  fraction ([place])   85 year old male with history of A. fib on Xarelto HFpEF, diabetes mellitus with PVD status post bilateral BKA, history of COVID-19 pneumonia 01/2019, CAD, presenting with altered mental status.     Acute metabolic encephalopathy -likely 2/2 UTI Improving Ct head negative Fall precautions     AKI (acute kidney injury) ([place]) -improving with ivf cre 1.40 Will continue to monitor with ivf Encouraged po intake   Sinus bradycardia- Hr 30-50. ? Possible tachy-brady synd. Since pt with hxo afib on amiodarone and beta blk Held amiodarone and beta blk Cards consulted , will continue to monitor rate closely for now     UTI (urinary tract infection) -IV Rocephin -Follow cultures    History of prostate cancer No acute conerns currently    Type 2 diabetes mellitus treated with insulin ([place]) Had hypoglycemic epidsodes, started on d5 fluid, now higher but need to wean off to see if bg stable.    Essential hypertension bp elevated. Add amlodipine Beta  blk home held 2/2 sb    PAD (peripheral artery disease) (HCC) Bilateral BKA status -Continue home aspirin and atorvastatin    Atrial fibrillation, chronic (HCC) On xarelto Hold beta blk and amiodarone   CAD (coronary artery disease) -continue asa and statin Holding beta blk    (HFpEF) heart failure with preserved ejection fraction (HCC) No acute exacerbation    DVT prophylaxis: xarelto Code Status:dnr Family Communication: none at bedside  Status is: Inpatient  Remains inpatient appropriate because:Inpatient level of care appropriate due to severity of illness   Dispo: The patient is from: Home              Anticipated d/c is to: Home              Anticipated d/c date is: 1 day              Patient currently is not medically stable to d/c.still needs monitoring for low heart rate. Needs to be off ivf to see if hypoglycemic.            LOS: 1 day   Time spent: 45 min with >50% on  coc    Nolberto Hanlon, MD [facility] Pager [phone]  If 7PM-7AM, please contact night-coverage [email or web address] Password [facility] 03/07/2020, 8:47 PM

## 2020-03-08 DIAGNOSIS — G9341 Metabolic encephalopathy: Secondary | ICD-10-CM | POA: Diagnosis not present

## 2020-03-08 LAB — CBC
HCT: 40.9 % (ref 39.0–52.0)
Hemoglobin: 13.7 g/dL (ref 13.0–17.0)
MCH: 30 pg (ref 26.0–34.0)
MCHC: 33.5 g/dL (ref 30.0–36.0)
MCV: 89.7 fL (ref 80.0–100.0)
Platelets: 236 10*3/uL (ref 150–400)
RBC: 4.56 MIL/uL (ref 4.22–5.81)
RDW: 14.8 % (ref 11.5–15.5)
WBC: 8.8 10*3/uL (ref 4.0–10.5)
nRBC: 0 % (ref 0.0–0.2)

## 2020-03-08 LAB — BASIC METABOLIC PANEL
Anion gap: 11 (ref 5–15)
BUN: 26 mg/dL — ABNORMAL HIGH (ref 8–23)
CO2: 27 mmol/L (ref 22–32)
Calcium: 8.9 mg/dL (ref 8.9–10.3)
Chloride: 98 mmol/L (ref 98–111)
Creatinine, Ser: 1.36 mg/dL — ABNORMAL HIGH (ref 0.61–1.24)
GFR, Estimated: 48 mL/min — ABNORMAL LOW (ref >60)
Glucose, Bld: 203 mg/dL — ABNORMAL HIGH (ref 70–99)
Potassium: 3.9 mmol/L (ref 3.5–5.1)
Sodium: 136 mmol/L (ref 135–145)

## 2020-03-08 LAB — GLUCOSE, CAPILLARY
Glucose-Capillary: 133 mg/dL — ABNORMAL HIGH (ref 70–99)
Glucose-Capillary: 151 mg/dL — ABNORMAL HIGH (ref 70–99)

## 2020-03-08 MED ORDER — CEPHALEXIN 250 MG PO CAPS
250.0000 mg | ORAL_CAPSULE | Freq: Three times a day (TID) | ORAL | Status: DC
  Administered 2020-03-08: 250 mg via ORAL
  Filled 2020-03-08 (×2): qty 1

## 2020-03-08 MED ORDER — AMLODIPINE BESYLATE 10 MG PO TABS: 10.0000 mg | ORAL_TABLET | Freq: Every day | ORAL | Status: DC

## 2020-03-08 MED ORDER — SODIUM CHLORIDE 0.9 % IV SOLN
1.0000 g | INTRAVENOUS | Status: DC
  Administered 2020-03-08: 1 g via INTRAVENOUS
  Filled 2020-03-08 (×2): qty 10

## 2020-03-08 MED ORDER — CEPHALEXIN 250 MG PO CAPS: 250.0000 mg | ORAL_CAPSULE | Freq: Three times a day (TID) | ORAL | 0 refills | Status: AC

## 2020-03-08 NOTE — Progress Notes (Signed)
Patient Name: [name] Date of Encounter: 03/08/2020  Hospital Problem List     Principal Problem:   Acute metabolic encephalopathy Active Problems:   History of prostate cancer   Type 2 diabetes mellitus treated with insulin ([place])   Essential hypertension   PAD (peripheral artery disease) (HCC)   Atrial fibrillation, chronic (HCC)   AKI (acute kidney injury) ([place])   S/P BKA (below knee amputation) unilateral, left (HCC)   Below-knee amputation of right lower extremity (HCC)   Hx of BKA, right (HCC)   Paroxysmal A-fib (HCC)   Seizures (HCC)   CAD (coronary artery disease)   Personal history of COVID-19   UTI (urinary tract infection)   (HFpEF) heart failure with preserved ejection fraction [facility])    Patient Profile     85 year old male with a past medical history significant for coronary artery disease, treated medically, atrial fibrillation, anticoagulated with Xarelto, ischemic cardiomyopathy with most recent EF 50-55%, PVD s/p bilateral BKA, and type 2 diabetes who presented to the ED on 03/06/20 for AMS. Per EMR notes, patient was in his normal state of health until a few hours prior to arrival where he was screaming and yelling, in which his wife called EMS. Workup in the ED has been significant for head CT revealing no evidence of acute intracranial abnormalities, creatinine of 1.54, GFR of 41, high sensitivity troponin borderline elevated x 2, 31 and 32 respectively, UA revealing leukocytes, RBC and WBC, and COVID-19 negative.  Patient with a significantly bradycardic on presentation.  Subjective   Less bradycardic and mental status has improved.   Inpatient Medications    . [START ON 03/09/2020] amLODipine  10 mg Oral Daily  . aspirin EC  81 mg Oral Daily  . atorvastatin  80 mg Oral QPM  . cephALEXin  250 mg Oral Q8H  . divalproex  500 mg Oral QHS  . insulin aspart  0-15 Units Subcutaneous TID WC  . insulin aspart  0-5 Units Subcutaneous QHS  .  isosorbide mononitrate  60 mg Oral Daily  . Rivaroxaban  15 mg Oral Q supper    Vital Signs    Vitals:   03/07/20 2331 03/08/20 0342 03/08/20 0700 03/08/20 1106  BP: (!) 134/55 (!) 176/81  (!) 176/84  Pulse: (!) 46 (!) 54  67  Resp:  20  16  Temp: 98 F (36.7 C) 97.7 F (36.5 C)  98 F (36.7 C)  TempSrc:  Oral  Oral  SpO2: 93% 95% 95% 96%    Intake/Output Summary (Last 24 hours) at 03/08/2020 1125 Last data filed at 03/08/2020 0654 Gross per 24 hour  Intake 780.09 ml  Output 400 ml  Net 380.09 ml   There were no vitals filed for this visit.  Physical Exam    GEN: Well nourished, well developed, in no acute distress.  HEENT: normal.  Neck: Supple, no JVD, carotid bruits, or masses. Cardiac: RRR, no murmurs, rubs, or gallops. No clubbing, cyanosis, edema.  Radials/DP/PT 2+ and equal bilaterally.  Respiratory:  Respirations regular and unlabored, clear to auscultation bilaterally. GI: Soft, nontender, nondistended, BS + x 4. MS: no deformity or atrophy. Skin: warm and dry, no rash. Neuro:  Strength and sensation are intact. Psych: Normal affect.  Labs    CBC Recent Labs    03/06/20 0100 03/08/20 0910  WBC 10.5 8.8  HGB 12.5* 13.7  HCT 37.9* 40.9  MCV 91.3 89.7  PLT 258 220   Basic Metabolic Panel  Recent Labs    03/07/20 0437 03/08/20 0910  NA 142 136  K 4.0 3.9  CL 105 98  CO2 28 27  GLUCOSE 123* 203*  BUN 35* 26*  CREATININE 1.40* 1.36*  CALCIUM 8.5* 8.9   Liver Function Tests Recent Labs    03/06/20 0100  AST 32  ALT 26  ALKPHOS 86  BILITOT 0.7  PROT 7.0  ALBUMIN 3.4*   No results for input(s): LIPASE, AMYLASE in the last 72 hours. Cardiac Enzymes No results for input(s): CKTOTAL, CKMB, CKMBINDEX, TROPONINI in the last 72 hours. BNP No results for input(s): BNP in the last 72 hours. D-Dimer No results for input(s): DDIMER in the last 72 hours. Hemoglobin A1C Recent Labs    03/06/20 0720  HGBA1C 8.0*   Fasting Lipid Panel No  results for input(s): CHOL, HDL, LDLCALC, TRIG, CHOLHDL, LDLDIRECT in the last 72 hours. Thyroid Function Tests Recent Labs    03/07/20 0437  TSH 2.725    Telemetry    Sinus bradycardia  ECG    sinus bradycardia.   Radiology    CT Head Wo Contrast  Result Date: 03/06/2020 CLINICAL DATA:  Mental status change, unknown coughs. High blood pressure. EXAM: CT HEAD WITHOUT CONTRAST TECHNIQUE: Contiguous axial images were obtained from the base of the skull through the vertex without intravenous contrast. COMPARISON:  CT head 11/30/2019 FINDINGS: Brain: Cerebral ventricle sizes are concordant with the degree of cerebral volume loss. Patchy and confluent areas of decreased attenuation are noted throughout the deep and periventricular white matter of the cerebral hemispheres bilaterally, compatible with chronic microvascular ischemic disease. No evidence of large-territorial acute infarction. No parenchymal hemorrhage. No mass lesion. No extra-axial collection. No mass effect or midline shift. No hydrocephalus. Basilar cisterns are patent. Vascular: No hyperdense vessel. Skull: No acute fracture or focal lesion. Sinuses/Orbits: Paranasal sinuses and mastoid air cells are clear. The orbits are unremarkable. Other: Atherosclerotic calcifications are present within the cavernous internal carotid arteries. IMPRESSION: No acute intracranial abnormality. Electronically Signed   By: Iven Finn M.D.   On: 03/06/2020 01:27   US RENAL  Result Date: 03/06/2020 CLINICAL DATA:  Renal failure EXAM: RENAL / URINARY TRACT ULTRASOUND COMPLETE COMPARISON:  11/30/2019 CT FINDINGS: Right Kidney: Renal measurements: 10.5 x 5.6 x 5.5 cm = volume: 169 mL. Echogenicity within normal limits. 9 mm echogenic, shadowing calcification within the upper pole. No mass or hydronephrosis visualized. Left Kidney: Renal measurements: 9.1 x 5.4 x 4.4 cm = volume: 114 mL. Echogenicity within normal limits. 11 mm echogenic shadowing  calcification within the lower pole. No mass or hydronephrosis visualized. Bladder: Urinary bladder wall appears diffusely thickened measuring 6 mm in diameter. Other: None. IMPRESSION: 1. No evidence of obstructive uropathy. 2. Echogenic, shadowing foci within each kidney which could reflect nonobstructing stones or vascular calcifications. 3. Mild circumferential thickening of the urinary bladder wall which could reflect cystitis or bladder outlet obstruction. Correlate with urinalysis. Electronically Signed   By: Davina Poke D.O.   On: 03/06/2020 15:29    Assessment & Plan    1. Bradycardia  -Currently in sinus bradycardia, rate in the upper 50s and lower 60s  -Will continue to  hold metoprolol and amiodarone and continue to closely. Rate appears to have stabilized.  follow rate.   2. Atrial fibrillation  -Currently in sinus bradycardia -Will continue to hold metoprolol and amiodarone as per above.  -Continue Xarelto 15mg    3. Coronary artery disease  -With recent LHC in February 2021,  treated medically Had occluded lad with collaterals. Has ruled out for mi.  -Continue aspirin, atorvastatin, and Xarelto   4. Type 2 Diabetes  -Continue SSI   5. UTI  -Agree with abx treatment   6. AKI -Creatinine improved. Baseline creatinine appears to be between 1.1 and 1.2. Will remain off of chlorthalidone for now.   7. HTN             -Blood pressure elevated presently likely secondary to holding metoprolol and chlorthalidone.  Will continue with amolodipine aat 10 mg and follow. Currently also on imdur 60 mg daily. May need to add hydralazine as outpatient if pressure remains up. This can be added as an outpatient.   Would remain off of AV nodal drugs for now.    Should be stable for discharge from cardiac standpoint on amlodipine 10, atorvastatin 80 and imdur  60 daily. Follow up with Dr. Nehemiah Massed next week.   Signed, Javier Docker Shabana Armentrout MD 03/08/2020, 11:25 AM  Pager: [phone]

## 2020-03-08 NOTE — Plan of Care (Signed)
Discharge order received. Patient mental status is at baseline. Vital signs stable . No signs of acute distress. Discharge instructions given. Patient verbalized understanding. No other issues noted at this time.   

## 2020-03-08 NOTE — Discharge Summary (Addendum)
[name] KKX:[id number] DOB: 02-Jun-1934 DOA: 03/06/2020  PCP: [name], NP  Admit date: 03/06/2020 Discharge date: 03/08/2020  Admitted From: home Disposition:  home  Recommendations for Outpatient Follow-up:  1. Follow up with PCP in 1 week 2. Please obtain BMP/CBC in one week 3. Dr. Nehemiah Massed in one week     Discharge Condition:Stable CODE STATUS:DNR  Diet recommendation:  Carb Modified   Brief/Interim Summary: Per HPI [name] is a 85 y.o. male with medical history significant for A. fib on Xarelto HFpEF, diabetes mellitus with PVD status post bilateral BKA, history of COVID-19 pneumonia 01/2019, CAD, who presents to the emergency room by EMS with altered mental status.  He was found with UTI started on IV antibiotics with Rocephin.  Sedated and pain meds were minimized.  CT of the head without any acute abnormality.  UA was positive.  During his hospitalization he was found with sinus bradycardia with heart rate 30s to 50s.  Cardiology was consulted and following.  His amiodarone and metoprolol were all held on admission.  He also had hypoglycemic episodes and was started on D5.  His insulin was held.  On discharge his diabetic medications were not resumed and patient was instructed to check blood glucose levels 4 times daily and if they start going up to initiate one medication slowly at a time and to follow-up with primary care next week for further adjustments.   Acute metabolic encephalopathy -likely 2/2 UTI Resolved Ct head negative    AKI (acute kidney injury) ([place]) Improved with IV fluids, around close to baseline    Sinus bradycardia- Hr 30-50. Amiodarone and beta-blockers will be discontinued Has appointment with cardiology next week for further management    UTI (urinary tract infection) Started on IV Rocephin and will switch to p.o. medication on discharge Urine culture had multiple organism  History of  prostate cancer No acute conerns currently  Type 2 diabetes mellitus treated with insulin ([place]) Had hypoglycemic epidsodes, started on d5 fluid, now higher, eating. Again will need to check fingersticks at home before initiating his insulin he was instructed about this.  He will need to follow-up with primary care for further evaluation and adjustments of his meds  essential hypertension Beta-blocker discontinued due to sinus bradycardia. Can resume other BP meds from home  PAD (peripheral artery disease) (HCC) Bilateral BKA status -Continue home aspirin and atorvastatin  Atrial fibrillation, chronic (HCC) On xarelto Discontinue beta blk and amiodarone due to bradycardia  CAD (coronary artery disease) -continue asa and statin    (HFpEF) heart failure with preserved ejection fraction (HCC) No acute exacerbation    Discharge Diagnoses:  Principal Problem:   Acute metabolic encephalopathy Active Problems:   History of prostate cancer   Type 2 diabetes mellitus treated with insulin (HCC)   Essential hypertension   PAD (peripheral artery disease) (HCC)   Atrial fibrillation, chronic (HCC)   AKI (acute kidney injury) ([place])   S/P BKA (below knee amputation) unilateral, left (HCC)   Below-knee amputation of right lower extremity (HCC)   Hx of BKA, right (HCC)   Paroxysmal A-fib (HCC)   Seizures (HCC)   CAD (coronary artery disease)   Personal history of COVID-19   UTI (urinary tract infection)   (HFpEF) heart failure with preserved ejection fraction [facility])    Discharge Instructions  Discharge Instructions    Call MD for:  persistant dizziness or light-headedness   Complete by: As directed    Diet - low sodium heart  healthy   Complete by: As directed    Discharge instructions   Complete by: As directed    Do not take diabetes medication, please ck your sugars 4x daily, if starts rising start metformin. Need to see pcp asap for medication changes Do  not take diltiazem, metoprolol, or amiodarone. Need to see cardiology in one week F/u with pcp in one week   Increase activity slowly   Complete by: As directed      Allergies as of 03/08/2020      Reactions   Contrast Media [iodinated Diagnostic Agents] Shortness Of Breath   SOB   Iohexol Shortness Of Breath    Desc: Respiratory Distress, Laryngedema, diaphoresis, Onset Date: [id number]   Metrizamide Shortness Of Breath   SOB SOB SOB   Latex Rash      Medication List    STOP taking these medications   acetaminophen 325 MG tablet Commonly known as: TYLENOL   amiodarone 200 MG tablet Commonly known as: PACERONE   insulin glargine 100 UNIT/ML injection Commonly known as: LANTUS   insulin lispro 100 UNIT/ML injection Commonly known as: HumaLOG   metFORMIN 500 MG tablet Commonly known as: GLUCOPHAGE   metoprolol succinate 50 MG 24 hr tablet Commonly known as: TOPROL-XL   metoprolol tartrate 25 MG tablet Commonly known as: LOPRESSOR   nitroGLYCERIN 0.4 MG SL tablet Commonly known as: NITROSTAT   NovoLOG FlexPen 100 UNIT/ML FlexPen Generic drug: insulin aspart   potassium chloride 20 MEQ packet Commonly known as: KLOR-CON   Tiadylt ER 120 MG 24 hr capsule Generic drug: diltiazem     TAKE these medications   aspirin EC 81 MG tablet Take 1 tablet (81 mg total) by mouth daily.   atorvastatin 80 MG tablet Commonly known as: LIPITOR Take 80 mg by mouth every evening.   bisacodyl 5 MG EC tablet Commonly known as: DULCOLAX Take 1 tablet (5 mg total) by mouth daily as needed for moderate constipation.   cephALEXin 250 MG capsule Commonly known as: KEFLEX Take 1 capsule (250 mg total) by mouth every 8 (eight) hours for 3 days.   chlorthalidone 25 MG tablet Commonly known as: HYGROTON Take 25 mg by mouth daily.   divalproex 500 MG DR tablet Commonly known as: DEPAKOTE Take 500 mg by mouth daily.   Ensure Max Protein Liqd Take 330 mLs (11 oz total) by  mouth 2 (two) times daily.   ferrous sulfate 325 (65 FE) MG tablet Take 1 tablet (325 mg total) by mouth daily with breakfast.   gabapentin 300 MG capsule Commonly known as: NEURONTIN Take 300 mg by mouth 3 (three) times daily.   isosorbide mononitrate 30 MG 24 hr tablet Commonly known as: IMDUR Take 60 mg by mouth daily.   Klor-Con M20 20 MEQ tablet Generic drug: potassium chloride SA Take 20 mEq by mouth 2 (two) times daily.   multivitamin with minerals Tabs tablet Take 1 tablet by mouth daily.   Rivaroxaban 15 MG Tabs tablet Commonly known as: XARELTO Take 1 tablet (15 mg total) by mouth daily with supper. Hold Xarelto for 3 days.  Start on January 09, 2019.   senna 8.6 MG Tabs tablet Commonly known as: SENOKOT Take 1 tablet (8.6 mg total) by mouth daily as needed for mild constipation.   Vitamin D-3 25 MCG (1000 UT) Caps Take 1,000 Units by mouth daily at 6 (six) AM.   zinc sulfate 220 (50 Zn) MG capsule Take 220 mg by mouth daily.  Follow-up Information    [name], NP Follow up.   Specialty: Family Medicine Why: Please schedule hospital follow up appointment within 5-7 days of discharge. Contact information: [place] [address] [phone]        Corey Skains, MD Follow up in 1 week(s).   Specialty: Cardiology Contact information: [facility] [place] [address] [phone]              Allergies  Allergen Reactions  . Contrast Media [Iodinated Diagnostic Agents] Shortness Of Breath    SOB  . Iohexol Shortness Of Breath     Desc: Respiratory Distress, Laryngedema, diaphoresis, Onset Date: [id number]   . Metrizamide Shortness Of Breath    SOB SOB SOB   . Latex Rash    Consultations:  Cardiology   Procedures/Studies: CT Head Wo Contrast  Result Date: 03/06/2020 CLINICAL DATA:  Mental status change, unknown coughs. High blood pressure. EXAM: CT HEAD  WITHOUT CONTRAST TECHNIQUE: Contiguous axial images were obtained from the base of the skull through the vertex without intravenous contrast. COMPARISON:  CT head 11/30/2019 FINDINGS: Brain: Cerebral ventricle sizes are concordant with the degree of cerebral volume loss. Patchy and confluent areas of decreased attenuation are noted throughout the deep and periventricular white matter of the cerebral hemispheres bilaterally, compatible with chronic microvascular ischemic disease. No evidence of large-territorial acute infarction. No parenchymal hemorrhage. No mass lesion. No extra-axial collection. No mass effect or midline shift. No hydrocephalus. Basilar cisterns are patent. Vascular: No hyperdense vessel. Skull: No acute fracture or focal lesion. Sinuses/Orbits: Paranasal sinuses and mastoid air cells are clear. The orbits are unremarkable. Other: Atherosclerotic calcifications are present within the cavernous internal carotid arteries. IMPRESSION: No acute intracranial abnormality. Electronically Signed   By: Iven Finn M.D.   On: 03/06/2020 01:27   US RENAL  Result Date: 03/06/2020 CLINICAL DATA:  Renal failure EXAM: RENAL / URINARY TRACT ULTRASOUND COMPLETE COMPARISON:  11/30/2019 CT FINDINGS: Right Kidney: Renal measurements: 10.5 x 5.6 x 5.5 cm = volume: 169 mL. Echogenicity within normal limits. 9 mm echogenic, shadowing calcification within the upper pole. No mass or hydronephrosis visualized. Left Kidney: Renal measurements: 9.1 x 5.4 x 4.4 cm = volume: 114 mL. Echogenicity within normal limits. 11 mm echogenic shadowing calcification within the lower pole. No mass or hydronephrosis visualized. Bladder: Urinary bladder wall appears diffusely thickened measuring 6 mm in diameter. Other: None. IMPRESSION: 1. No evidence of obstructive uropathy. 2. Echogenic, shadowing foci within each kidney which could reflect nonobstructing stones or vascular calcifications. 3. Mild circumferential thickening of  the urinary bladder wall which could reflect cystitis or bladder outlet obstruction. Correlate with urinalysis. Electronically Signed   By: Davina Poke D.O.   On: 03/06/2020 15:29       Subjective: Laying well.  Wants to go home.  Responds appropriately.  Discharge Exam: Vitals:   03/08/20 0700 03/08/20 1106  BP:  (!) 176/84  Pulse:  67  Resp:  16  Temp:  98 F (36.7 C)  SpO2: 95% 96%   Vitals:   03/07/20 2331 03/08/20 0342 03/08/20 0700 03/08/20 1106  BP: (!) 134/55 (!) 176/81  (!) 176/84  Pulse: (!) 46 (!) 54  67  Resp:  20  16  Temp: 98 F (36.7 C) 97.7 F (36.5 C)  98 F (36.7 C)  TempSrc:  Oral  Oral  SpO2: 93% 95% 95% 96%    General: Pt is alert, awake oriented to  place and person, still not to date, not in acute distress. Interactive.  Cardiovascular: RRR, S1/S2 +, no rubs, no gallops Respiratory: CTA bilaterally, no wheezing, no rhonchi Abdominal: Soft, NT, ND, bowel sounds + Extremities: no edema, b/l amputation    The results of significant diagnostics from this hospitalization (including imaging, microbiology, ancillary and laboratory) are listed below for reference.     Microbiology: Recent Results (from the past 240 hour(s))  Urine culture     Status: Abnormal   Collection Time: 03/06/20  1:01 AM   Specimen: Urine, Random  Result Value Ref Range Status   Specimen Description   Final    URINE, RANDOM Performed at [facility], [address] [place], [place] [address]    Special Requests   Final    NONE Performed at [facility], [address] [place], [place] [address]    Culture MULTIPLE SPECIES PRESENT, SUGGEST RECOLLECTION (A)  Final   Report Status 03/07/2020 FINAL  Final  Respiratory Panel by RT PCR (Flu A&B, Covid) - Nasopharyngeal Swab     Status: None   Collection Time: 03/06/20  1:01 AM   Specimen: Nasopharyngeal Swab  Result Value Ref Range Status   SARS Coronavirus 2 by RT PCR NEGATIVE NEGATIVE Final     Comment: (NOTE) SARS-CoV-2 target nucleic acids are NOT DETECTED.  The SARS-CoV-2 RNA is generally detectable in upper respiratoy specimens during the acute phase of infection. The lowest concentration of SARS-CoV-2 viral copies this assay can detect is 131 copies/mL. A negative result does not preclude SARS-Cov-2 infection and should not be used as the sole basis for treatment or other patient management decisions. A negative result may occur with  improper specimen collection/handling, submission of specimen other than nasopharyngeal swab, presence of viral mutation(s) within the areas targeted by this assay, and inadequate number of viral copies (<131 copies/mL). A negative result must be combined with clinical observations, patient history, and epidemiological information. The expected result is Negative.  Fact Sheet for Patients:  PinkCheek.be  Fact Sheet for Healthcare Providers:  GravelBags.it  This test is no t yet approved or cleared by the [place] FDA and  has been authorized for detection and/or diagnosis of SARS-CoV-2 by FDA under an Emergency Use Authorization (EUA). This EUA will remain  in effect (meaning this test can be used) for the duration of the COVID-19 declaration under Section 564(b)(1) of the Act, 21 U.S.C. section 360bbb-3(b)(1), unless the authorization is terminated or revoked sooner.     Influenza A by PCR NEGATIVE NEGATIVE Final   Influenza B by PCR NEGATIVE NEGATIVE Final    Comment: (NOTE) The Xpert Xpress SARS-CoV-2/FLU/RSV assay is intended as an aid in  the diagnosis of influenza from Nasopharyngeal swab specimens and  should not be used as a sole basis for treatment. Nasal washings and  aspirates are unacceptable for Xpert Xpress SARS-CoV-2/FLU/RSV  testing.  Fact Sheet for Patients: PinkCheek.be  Fact Sheet for Healthcare  Providers: GravelBags.it  This test is not yet approved or cleared by the [place] FDA and  has been authorized for detection and/or diagnosis of SARS-CoV-2 by  FDA under an Emergency Use Authorization (EUA). This EUA will remain  in effect (meaning this test can be used) for the duration of the  Covid-19 declaration under Section 564(b)(1) of the Act, 21  U.S.C. section 360bbb-3(b)(1), unless the authorization is  terminated or revoked. Performed at [facility], [address], [place], [place] [address]  Labs: BNP (last 3 results) Recent Labs    06/24/19 1346 09/16/19 0649 11/30/19 0109  BNP 887.0* 734.0* 941.7*   Basic Metabolic Panel: Recent Labs  Lab 03/06/20 0100 03/07/20 0437 03/08/20 0910  NA 144 142 136  K 4.3 4.0 3.9  CL 109 105 98  CO2 24 28 27   GLUCOSE 101* 123* 203*  BUN 43* 35* 26*  CREATININE 1.54* 1.40* 1.36*  CALCIUM 8.5* 8.5* 8.9   Liver Function Tests: Recent Labs  Lab 03/06/20 0100  AST 32  ALT 26  ALKPHOS 86  BILITOT 0.7  PROT 7.0  ALBUMIN 3.4*   No results for input(s): LIPASE, AMYLASE in the last 168 hours. No results for input(s): AMMONIA in the last 168 hours. CBC: Recent Labs  Lab 03/06/20 0100 03/08/20 0910  WBC 10.5 8.8  HGB 12.5* 13.7  HCT 37.9* 40.9  MCV 91.3 89.7  PLT 258 236   Cardiac Enzymes: No results for input(s): CKTOTAL, CKMB, CKMBINDEX, TROPONINI in the last 168 hours. BNP: Invalid input(s): POCBNP CBG: Recent Labs  Lab 03/07/20 0744 03/07/20 1133 03/07/20 1758 03/08/20 0750 03/08/20 1131  GLUCAP 102* 148* 155* 133* 151*   D-Dimer No results for input(s): DDIMER in the last 72 hours. Hgb A1c Recent Labs    03/06/20 0720  HGBA1C 8.0*   Lipid Profile No results for input(s): CHOL, HDL, LDLCALC, TRIG, CHOLHDL, LDLDIRECT in the last 72 hours. Thyroid function studies Recent Labs    03/07/20 0437  TSH 2.725   Anemia work up No results for  input(s): VITAMINB12, FOLATE, FERRITIN, TIBC, IRON, RETICCTPCT in the last 72 hours. Urinalysis    Component Value Date/Time   COLORURINE YELLOW (A) 03/06/2020 0101   APPEARANCEUR CLOUDY (A) 03/06/2020 0101   LABSPEC 1.015 03/06/2020 0101   PHURINE 7.0 03/06/2020 0101   GLUCOSEU NEGATIVE 03/06/2020 0101   HGBUR LARGE (A) 03/06/2020 0101   BILIRUBINUR NEGATIVE 03/06/2020 0101   KETONESUR NEGATIVE 03/06/2020 0101   PROTEINUR 100 (A) 03/06/2020 0101   NITRITE NEGATIVE 03/06/2020 0101   LEUKOCYTESUR LARGE (A) 03/06/2020 0101   Sepsis Labs Invalid input(s): PROCALCITONIN,  WBC,  LACTICIDVEN Microbiology Recent Results (from the past 240 hour(s))  Urine culture     Status: Abnormal   Collection Time: 03/06/20  1:01 AM   Specimen: Urine, Random  Result Value Ref Range Status   Specimen Description   Final    URINE, RANDOM Performed at [facility], [address], [place], [place] [address]    Special Requests   Final    NONE Performed at [facility], [address], [place], [place] [address]    Culture MULTIPLE SPECIES PRESENT, SUGGEST RECOLLECTION (A)  Final   Report Status 03/07/2020 FINAL  Final  Respiratory Panel by RT PCR (Flu A&B, Covid) - Nasopharyngeal Swab     Status: None   Collection Time: 03/06/20  1:01 AM   Specimen: Nasopharyngeal Swab  Result Value Ref Range Status   SARS Coronavirus 2 by RT PCR NEGATIVE NEGATIVE Final    Comment: (NOTE) SARS-CoV-2 target nucleic acids are NOT DETECTED.  The SARS-CoV-2 RNA is generally detectable in upper respiratoy specimens during the acute phase of infection. The lowest concentration of SARS-CoV-2 viral copies this assay can detect is 131 copies/mL. A negative result does not preclude SARS-Cov-2 infection and should not be used as the sole basis for treatment or other patient management decisions. A negative result may occur with  improper specimen collection/handling, submission of specimen  other than nasopharyngeal swab, presence of viral mutation(s) within the areas targeted by this assay, and inadequate number of viral copies (<131 copies/mL). A negative result must be combined with clinical observations, patient history, and epidemiological information. The expected result is Negative.  Fact Sheet for Patients:  PinkCheek.be  Fact Sheet for Healthcare Providers:  GravelBags.it  This test is no t yet approved or cleared by the [place] FDA and  has been authorized for detection and/or diagnosis of SARS-CoV-2 by FDA under an Emergency Use Authorization (EUA). This EUA will remain  in effect (meaning this test can be used) for the duration of the COVID-19 declaration under Section 564(b)(1) of the Act, 21 U.S.C. section 360bbb-3(b)(1), unless the authorization is terminated or revoked sooner.     Influenza A by PCR NEGATIVE NEGATIVE Final   Influenza B by PCR NEGATIVE NEGATIVE Final    Comment: (NOTE) The Xpert Xpress SARS-CoV-2/FLU/RSV assay is intended as an aid in  the diagnosis of influenza from Nasopharyngeal swab specimens and  should not be used as a sole basis for treatment. Nasal washings and  aspirates are unacceptable for Xpert Xpress SARS-CoV-2/FLU/RSV  testing.  Fact Sheet for Patients: PinkCheek.be  Fact Sheet for Healthcare Providers: GravelBags.it  This test is not yet approved or cleared by the [place] FDA and  has been authorized for detection and/or diagnosis of SARS-CoV-2 by  FDA under an Emergency Use Authorization (EUA). This EUA will remain  in effect (meaning this test can be used) for the duration of the  Covid-19 declaration under Section 564(b)(1) of the Act, 21  U.S.C. section 360bbb-3(b)(1), unless the authorization is  terminated or revoked. Performed at [facility], [address]  [place],  [address]      Time coordinating discharge: Over 30 minutes  SIGNED:   Nolberto Hanlon, MD  [facility] 03/08/2020, 1:46 PM Pager   If 7PM-7AM, please contact night-coverage [email or web address] Password [email or web address]

## 2020-04-08 ENCOUNTER — Emergency Department: Payer: Medicare Other

## 2020-04-08 ENCOUNTER — Observation Stay
Admission: EM | Admit: 2020-04-08 | Discharge: 2020-04-10 | Disposition: A | Discharge status: Home / Self Care | Payer: Medicare Other | Attending: Internal Medicine | Admitting: Internal Medicine

## 2020-04-08 ENCOUNTER — Inpatient Hospital Stay: Payer: Medicare Other

## 2020-04-08 ENCOUNTER — Other Ambulatory Visit: Payer: Self-pay

## 2020-04-08 DIAGNOSIS — I480 Paroxysmal atrial fibrillation: Secondary | ICD-10-CM | POA: Diagnosis not present

## 2020-04-08 DIAGNOSIS — Z794 Long term (current) use of insulin: Secondary | ICD-10-CM | POA: Diagnosis not present

## 2020-04-08 DIAGNOSIS — R569 Unspecified convulsions: Secondary | ICD-10-CM

## 2020-04-08 DIAGNOSIS — I10 Essential (primary) hypertension: Secondary | ICD-10-CM | POA: Diagnosis not present

## 2020-04-08 DIAGNOSIS — N189 Chronic kidney disease, unspecified: Secondary | ICD-10-CM | POA: Diagnosis not present

## 2020-04-08 DIAGNOSIS — I5032 Chronic diastolic (congestive) heart failure: Secondary | ICD-10-CM | POA: Insufficient documentation

## 2020-04-08 DIAGNOSIS — Z79899 Other long term (current) drug therapy: Secondary | ICD-10-CM | POA: Diagnosis not present

## 2020-04-08 DIAGNOSIS — R262 Difficulty in walking, not elsewhere classified: Secondary | ICD-10-CM | POA: Insufficient documentation

## 2020-04-08 DIAGNOSIS — E162 Hypoglycemia, unspecified: Secondary | ICD-10-CM

## 2020-04-08 DIAGNOSIS — R001 Bradycardia, unspecified: Secondary | ICD-10-CM | POA: Diagnosis present

## 2020-04-08 DIAGNOSIS — Z87891 Personal history of nicotine dependence: Secondary | ICD-10-CM | POA: Diagnosis not present

## 2020-04-08 DIAGNOSIS — N179 Acute kidney failure, unspecified: Secondary | ICD-10-CM

## 2020-04-08 DIAGNOSIS — E119 Type 2 diabetes mellitus without complications: Secondary | ICD-10-CM | POA: Insufficient documentation

## 2020-04-08 DIAGNOSIS — Z7901 Long term (current) use of anticoagulants: Secondary | ICD-10-CM | POA: Insufficient documentation

## 2020-04-08 DIAGNOSIS — I130 Hypertensive heart and chronic kidney disease with heart failure and stage 1 through stage 4 chronic kidney disease, or unspecified chronic kidney disease: Secondary | ICD-10-CM | POA: Diagnosis not present

## 2020-04-08 DIAGNOSIS — N3000 Acute cystitis without hematuria: Secondary | ICD-10-CM | POA: Insufficient documentation

## 2020-04-08 DIAGNOSIS — Z8546 Personal history of malignant neoplasm of prostate: Secondary | ICD-10-CM | POA: Diagnosis not present

## 2020-04-08 DIAGNOSIS — R4182 Altered mental status, unspecified: Principal | ICD-10-CM | POA: Insufficient documentation

## 2020-04-08 DIAGNOSIS — Z7982 Long term (current) use of aspirin: Secondary | ICD-10-CM | POA: Diagnosis not present

## 2020-04-08 DIAGNOSIS — I2510 Atherosclerotic heart disease of native coronary artery without angina pectoris: Secondary | ICD-10-CM | POA: Diagnosis present

## 2020-04-08 DIAGNOSIS — Z20822 Contact with and (suspected) exposure to covid-19: Secondary | ICD-10-CM | POA: Diagnosis not present

## 2020-04-08 DIAGNOSIS — N3001 Acute cystitis with hematuria: Secondary | ICD-10-CM

## 2020-04-08 DIAGNOSIS — E118 Type 2 diabetes mellitus with unspecified complications: Secondary | ICD-10-CM

## 2020-04-08 DIAGNOSIS — I4891 Unspecified atrial fibrillation: Secondary | ICD-10-CM | POA: Insufficient documentation

## 2020-04-08 DIAGNOSIS — N390 Urinary tract infection, site not specified: Secondary | ICD-10-CM | POA: Diagnosis present

## 2020-04-08 DIAGNOSIS — Z8616 Personal history of COVID-19: Secondary | ICD-10-CM | POA: Diagnosis not present

## 2020-04-08 DIAGNOSIS — G40909 Epilepsy, unspecified, not intractable, without status epilepticus: Secondary | ICD-10-CM

## 2020-04-08 DIAGNOSIS — Z91040 Latex allergy status: Secondary | ICD-10-CM | POA: Insufficient documentation

## 2020-04-08 LAB — CBC WITH DIFFERENTIAL/PLATELET
Abs Immature Granulocytes: 0.06 10*3/uL (ref 0.00–0.07)
Basophils Absolute: 0.1 10*3/uL (ref 0.0–0.1)
Basophils Relative: 1 %
Eosinophils Absolute: 0.4 10*3/uL (ref 0.0–0.5)
Eosinophils Relative: 4 %
HCT: 40.7 % (ref 39.0–52.0)
Hemoglobin: 13.3 g/dL (ref 13.0–17.0)
Immature Granulocytes: 1 %
Lymphocytes Relative: 13 %
Lymphs Abs: 1.5 10*3/uL (ref 0.7–4.0)
MCH: 31 pg (ref 26.0–34.0)
MCHC: 32.7 g/dL (ref 30.0–36.0)
MCV: 94.9 fL (ref 80.0–100.0)
Monocytes Absolute: 1.1 10*3/uL — ABNORMAL HIGH (ref 0.1–1.0)
Monocytes Relative: 10 %
Neutro Abs: 8.1 10*3/uL — ABNORMAL HIGH (ref 1.7–7.7)
Neutrophils Relative %: 71 %
Platelets: 254 10*3/uL (ref 150–400)
RBC: 4.29 MIL/uL (ref 4.22–5.81)
RDW: 14.6 % (ref 11.5–15.5)
WBC: 11.3 10*3/uL — ABNORMAL HIGH (ref 4.0–10.5)
nRBC: 0 % (ref 0.0–0.2)

## 2020-04-08 LAB — COMPREHENSIVE METABOLIC PANEL
ALT: 25 U/L (ref 0–44)
AST: 60 U/L — ABNORMAL HIGH (ref 15–41)
Albumin: 3.4 g/dL — ABNORMAL LOW (ref 3.5–5.0)
Alkaline Phosphatase: 104 U/L (ref 38–126)
Anion gap: 13 (ref 5–15)
BUN: 44 mg/dL — ABNORMAL HIGH (ref 8–23)
CO2: 23 mmol/L (ref 22–32)
Calcium: 8.5 mg/dL — ABNORMAL LOW (ref 8.9–10.3)
Chloride: 105 mmol/L (ref 98–111)
Creatinine, Ser: 1.56 mg/dL — ABNORMAL HIGH (ref 0.61–1.24)
GFR, Estimated: 44 mL/min — ABNORMAL LOW (ref >60)
Glucose, Bld: 69 mg/dL — ABNORMAL LOW (ref 70–99)
Potassium: 5.2 mmol/L — ABNORMAL HIGH (ref 3.5–5.1)
Sodium: 141 mmol/L (ref 135–145)
Total Bilirubin: 1.1 mg/dL (ref 0.3–1.2)
Total Protein: 7.2 g/dL (ref 6.5–8.1)

## 2020-04-08 LAB — URINALYSIS, COMPLETE (UACMP) WITH MICROSCOPIC
Bilirubin Urine: NEGATIVE
Glucose, UA: NEGATIVE mg/dL
Ketones, ur: NEGATIVE mg/dL
Nitrite: NEGATIVE
Protein, ur: 30 mg/dL — AB
Specific Gravity, Urine: 1.015 (ref 1.005–1.030)
Squamous Epithelial / HPF: NONE SEEN (ref 0–5)
pH: 7 (ref 5.0–8.0)

## 2020-04-08 LAB — LIPID PANEL
Cholesterol: 154 mg/dL (ref 0–200)
HDL: 39 mg/dL — ABNORMAL LOW (ref >40)
LDL Cholesterol: 101 mg/dL — ABNORMAL HIGH (ref 0–99)
Total CHOL/HDL Ratio: 3.9 RATIO
Triglycerides: 72 mg/dL (ref <150)
VLDL: 14 mg/dL (ref 0–40)

## 2020-04-08 LAB — CBG MONITORING, ED
Glucose-Capillary: 134 mg/dL — ABNORMAL HIGH (ref 70–99)
Glucose-Capillary: 88 mg/dL (ref 70–99)

## 2020-04-08 LAB — RESPIRATORY PANEL BY RT PCR (FLU A&B, COVID)
Influenza A by PCR: NEGATIVE
Influenza B by PCR: NEGATIVE
SARS Coronavirus 2 by RT PCR: NEGATIVE

## 2020-04-08 MED ORDER — CHLORTHALIDONE 25 MG PO TABS: 25.0000 mg | ORAL_TABLET | Freq: Every day | ORAL | Status: DC

## 2020-04-08 MED ORDER — CHLORTHALIDONE 25 MG PO TABS
12.5000 mg | ORAL_TABLET | Freq: Every day | ORAL | Status: DC
  Administered 2020-04-09 – 2020-04-10 (×2): 12.5 mg via ORAL
  Filled 2020-04-08 (×2): qty 0.5

## 2020-04-08 MED ORDER — ACETAMINOPHEN 650 MG RE SUPP: 650.0000 mg | Freq: Four times a day (QID) | RECTAL | Status: DC | PRN

## 2020-04-08 MED ORDER — ATORVASTATIN CALCIUM 20 MG PO TABS
80.0000 mg | ORAL_TABLET | Freq: Every evening | ORAL | Status: DC
  Administered 2020-04-08 – 2020-04-09 (×2): 80 mg via ORAL
  Filled 2020-04-08 (×2): qty 4

## 2020-04-08 MED ORDER — VITAMIN D 25 MCG (1000 UNIT) PO TABS
1000.0000 [IU] | ORAL_TABLET | Freq: Every day | ORAL | Status: DC
  Administered 2020-04-09 – 2020-04-10 (×2): 1000 [IU] via ORAL
  Filled 2020-04-08 (×2): qty 1

## 2020-04-08 MED ORDER — HEPARIN SODIUM (PORCINE) 5000 UNIT/ML IJ SOLN: 5000.0000 [IU] | Freq: Three times a day (TID) | INTRAMUSCULAR | Status: DC

## 2020-04-08 MED ORDER — RIVAROXABAN 15 MG PO TABS
15.0000 mg | ORAL_TABLET | Freq: Every day | ORAL | Status: DC
  Administered 2020-04-08 – 2020-04-09 (×2): 15 mg via ORAL
  Filled 2020-04-08 (×3): qty 1

## 2020-04-08 MED ORDER — LACTATED RINGERS IV BOLUS
1000.0000 mL | Freq: Once | INTRAVENOUS | Status: AC
  Administered 2020-04-08: 1000 mL via INTRAVENOUS

## 2020-04-08 MED ORDER — DIVALPROEX SODIUM 500 MG PO DR TAB
500.0000 mg | DELAYED_RELEASE_TABLET | Freq: Every day | ORAL | Status: DC
  Administered 2020-04-09 – 2020-04-10 (×2): 500 mg via ORAL
  Filled 2020-04-08 (×2): qty 1

## 2020-04-08 MED ORDER — DIPHENHYDRAMINE HCL 50 MG/ML IJ SOLN: 50.0000 mg | Freq: Once | INTRAMUSCULAR | Status: DC | PRN

## 2020-04-08 MED ORDER — SODIUM CHLORIDE 0.9 % IV SOLN
1.0000 g | Freq: Once | INTRAVENOUS | Status: AC
  Administered 2020-04-08: 1 g via INTRAVENOUS
  Filled 2020-04-08: qty 10

## 2020-04-08 MED ORDER — ASPIRIN EC 81 MG PO TBEC
81.0000 mg | DELAYED_RELEASE_TABLET | Freq: Every day | ORAL | Status: DC
  Administered 2020-04-09 – 2020-04-10 (×2): 81 mg via ORAL
  Filled 2020-04-08 (×2): qty 1

## 2020-04-08 MED ORDER — GABAPENTIN 300 MG PO CAPS
300.0000 mg | ORAL_CAPSULE | Freq: Three times a day (TID) | ORAL | Status: DC
  Administered 2020-04-09 – 2020-04-10 (×4): 300 mg via ORAL
  Filled 2020-04-08 (×5): qty 1

## 2020-04-08 MED ORDER — ISOSORBIDE MONONITRATE ER 30 MG PO TB24
60.0000 mg | ORAL_TABLET | Freq: Every day | ORAL | Status: DC
  Administered 2020-04-09 – 2020-04-10 (×2): 60 mg via ORAL
  Filled 2020-04-08 (×2): qty 2

## 2020-04-08 MED ORDER — ENSURE MAX PROTEIN PO LIQD
11.0000 [oz_av] | Freq: Two times a day (BID) | ORAL | Status: DC
  Administered 2020-04-08 – 2020-04-09 (×3): 11 [oz_av] via ORAL
  Filled 2020-04-08: qty 330

## 2020-04-08 MED ORDER — ZINC SULFATE 220 (50 ZN) MG PO CAPS
220.0000 mg | ORAL_CAPSULE | Freq: Every day | ORAL | Status: DC
  Administered 2020-04-09 – 2020-04-10 (×2): 220 mg via ORAL
  Filled 2020-04-08 (×2): qty 1

## 2020-04-08 MED ORDER — ACETAMINOPHEN 325 MG PO TABS
650.0000 mg | ORAL_TABLET | Freq: Four times a day (QID) | ORAL | Status: DC | PRN
  Filled 2020-04-08: qty 2

## 2020-04-08 MED ORDER — BISACODYL 5 MG PO TBEC: 5.0000 mg | DELAYED_RELEASE_TABLET | Freq: Every day | ORAL | Status: DC | PRN

## 2020-04-08 NOTE — Assessment & Plan Note (Signed)
U/A showing moderate leucocytes pt given rocephin.  Cont and tailor therapy once c/s resulted.

## 2020-04-08 NOTE — ED Notes (Signed)
Meal tray ordred

## 2020-04-08 NOTE — Assessment & Plan Note (Signed)
Cont asa 81/ Lipitor 80 mg/ and Imdur.  AM lipid panel.

## 2020-04-08 NOTE — Assessment & Plan Note (Signed)
Cont depakote. And obtain level. Seizure, aspiration, fall precaution.

## 2020-04-08 NOTE — ED Provider Notes (Signed)
[facility] Emergency Department Provider Note   ____________________________________________   First MD Initiated Contact with Patient 04/08/20 1324     (approximate)  I have reviewed the triage vital signs and the nursing notes.   HISTORY  Chief Complaint Altered Mental Status    HPI [name] is a 85 y.o. male with possible history of diabetes, atrial fibrillation on Xarelto, peripheral vascular disease status post bilateral BKA, CAD, and recurrent UTI who presents to the ED for altered mental status.  History is limited due to patient's confusion.  He states he feels fine and he is not sure why he is here in the hospital.  Per EMS, patient was somnolent and less responsive than usual earlier today, visiting nurse noted his blood sugar was low.  EMS was called and initial blood sugar was 61, he was separately given some cookies to eat and it improved to 98.  Patient denies any fevers, cough, chest pain, shortness of breath, dysuria, hematuria, abdominal pain, vomiting, or diarrhea.  He does have a history of recurrent UTI along with multiple episodes of hypoglycemia during recent admission.  Patient is not sure whether he has been eating or drinking appropriately today.        Past Medical History:  Diagnosis Date   Arthritis    Atrial fibrillation ([place])    Carcinoma of prostate ([place])    CHF (congestive heart failure) ([place])    Chronic kidney disease    Diabetes mellitus without complication [facility])    ED (erectile dysfunction)    Frequent urination    Hyperlipidemia    Hypertension    Moderate mitral insufficiency    Peripheral vascular disease ([place])    Pneumonia 04/2016   Prostate cancer ([place])    Sleep apnea    OSA--USE C-PAP   Ulcer of left foot due to type 2 diabetes mellitus (HCC)    Urinary stress incontinence, male     Patient Active Problem List   Diagnosis Date Noted   CAD (coronary artery disease)  03/06/2020   Personal history of COVID-19 42/35/3614   Acute metabolic encephalopathy 43/15/4008   UTI (urinary tract infection) 03/06/2020   (HFpEF) heart failure with preserved ejection fraction ([place]) 03/06/2020   Goals of care, counseling/discussion    Palliative care by specialist    Seizures ([place]) 11/30/2019   ACS (acute coronary syndrome) ([place]) 06/24/2019   Bradycardia 05/16/2019   Paroxysmal A-fib ([place]) 03/27/2019   Hx of BKA, right ([place]) 03/02/2019   Severe protein-calorie malnutrition ([place]) 67/61/9509   Acute systolic CHF (congestive heart failure) ([place]) 02/16/2019   Acute respiratory failure with hypoxia ([place]) 02/08/2019   Pneumonia due to COVID-19 virus 02/08/2019   Below-knee amputation of right lower extremity ([place]) 01/15/2019   Polyp of descending colon    Iron deficiency anemia due to chronic blood loss    Sacral decubitus ulcer, stage II ([place]) 12/26/2018   Acute respiratory failure ([place]) 12/20/2018   Femur fracture ([place]) 10/13/2018   Chest pain 06/03/2018   NSTEMI (non-ST elevated myocardial infarction) ([place]) 01/16/2018   Acute CHF (congestive heart failure) ([place]) 11/23/2017   Atrial fibrillation with rapid ventricular response ([place]) 10/15/2017   BKA stump complication ([place]) 32/67/1245   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 ([place]) 02/10/2017   S/P BKA (below knee amputation) unilateral, left ([place]) 02/01/2017   Chronic diastolic heart failure ([place]) 01/21/2017   Pressure injury of skin 01/18/2017   Atherosclerosis of artery of extremity with gangrene ([place])  01/05/2017   Diabetic foot ulcer ([place]) 12/29/2016   Ataxia 10/21/2016   Atherosclerosis of native arteries of the extremities with ulceration ([place]) 10/12/2016   History of femoral angiogram 09/06/2016   Left leg pain 09/06/2016   Leukocytosis 09/06/2016   Generalized weakness 09/06/2016   AKI (acute kidney injury) ([place]) 09/06/2016   Atrial fibrillation,  chronic ([place]) 08/30/2016   Pneumonia 05/19/2016   Hyperlipidemia 04/20/2016   Back pain 04/19/2016   Type 2 diabetes mellitus treated with insulin ([place]) 04/19/2016   Essential hypertension 04/19/2016   PAD (peripheral artery disease) ([place]) 04/19/2016   Erectile dysfunction following radical prostatectomy 06/25/2015   History of prostate cancer 12/23/2014   Incontinence 12/23/2014    Past Surgical History:  Procedure Laterality Date   AMPUTATION Left 01/12/2017   Procedure: AMPUTATION BELOW KNEE;  Surgeon: Algernon Huxley, MD;  Location: [facility] ORS;  Service: General;  Laterality: Left;   AMPUTATION Right 12/27/2018   Procedure: AMPUTATION BELOW KNEE;  Surgeon: Algernon Huxley, MD;  Location: [facility] ORS;  Service: General;  Laterality: Right;   APLIGRAFT PLACEMENT Left 10/15/2016   Procedure: APLIGRAFT PLACEMENT;  Surgeon: Albertine Patricia, DPM;  Location: [facility] ORS;  Service: Podiatry;  Laterality: Left;  necrotic ulcer   CHOLECYSTECTOMY     COLONOSCOPY WITH PROPOFOL N/A 01/02/2019   Procedure: COLONOSCOPY WITH PROPOFOL;  Surgeon: Lucilla Lame, MD;  Location: [facility] ENDOSCOPY;  Service: Endoscopy;  Laterality: N/A;   COLONOSCOPY WITH PROPOFOL N/A 01/05/2019   Procedure: COLONOSCOPY WITH PROPOFOL;  Surgeon: Lucilla Lame, MD;  Location: [facility] ENDOSCOPY;  Service: Endoscopy;  Laterality: N/A;   ESOPHAGOGASTRODUODENOSCOPY (EGD) WITH PROPOFOL N/A 01/02/2019   Procedure: ESOPHAGOGASTRODUODENOSCOPY (EGD) WITH PROPOFOL;  Surgeon: Lucilla Lame, MD;  Location: [facility] ENDOSCOPY;  Service: Endoscopy;  Laterality: N/A;   EYE SURGERY Bilateral    Cataract Extraction with IOL   HIP ARTHROPLASTY Right 10/13/2018   Procedure: ARTHROPLASTY BIPOLAR HIP (HEMIARTHROPLASTY);  Surgeon: Earnestine Leys, MD;  Location: [facility] ORS;  Service: Orthopedics;  Laterality: Right;   IRRIGATION AND DEBRIDEMENT FOOT Left 10/15/2016   Procedure: IRRIGATION AND DEBRIDEMENT FOOT-EXCISIONAL DEBRIDEMENT OF SKIN 3RD, 4TH AND 5TH TOES  WITH APPLICATION OF APLIGRAFT;  Surgeon: Albertine Patricia, DPM;  Location: [facility] ORS;  Service: Podiatry;  Laterality: Left;  necrotic,gangrene   IRRIGATION AND DEBRIDEMENT FOOT Left 12/09/2016   Procedure: IRRIGATION AND DEBRIDEMENT FOOT-MUSCLE/FASCIA, RESECTION OF FOURTH AND FIFTH METATARSAL NECROTIC BONE;  Surgeon: Albertine Patricia, DPM;  Location: [facility] ORS;  Service: Podiatry;  Laterality: Left;   IRRIGATION AND DEBRIDEMENT FOOT Right 12/23/2018   Procedure: IRRIGATION AND DEBRIDEMENT FOOT and wound vac application;  Surgeon: Samara Deist, DPM;  Location: [facility] ORS;  Service: Podiatry;  Laterality: Right;   LEFT HEART CATH N/A 06/25/2019   Procedure: Left Heart Cath and Coronary Angiography;  Surgeon: Isaias Cowman, MD;  Location: [place] CV LAB;  Service: Cardiovascular;  Laterality: N/A;   LEFT HEART CATH AND CORONARY ANGIOGRAPHY N/A 06/05/2018   Procedure: LEFT HEART CATH AND CORONARY ANGIOGRAPHY;  Surgeon: Yolonda Kida, MD;  Location: [place] CV LAB;  Service: Cardiovascular;  Laterality: N/A;   LOWER EXTREMITY ANGIOGRAPHY Left 08/16/2016   Procedure: Lower Extremity Angiography;  Surgeon: Algernon Huxley, MD;  Location: [place] CV LAB;  Service: Cardiovascular;  Laterality: Left;   LOWER EXTREMITY ANGIOGRAPHY Left 09/01/2016   Procedure: Lower Extremity Angiography;  Surgeon: Algernon Huxley, MD;  Location: [place] CV LAB;  Service: Cardiovascular;  Laterality: Left;   LOWER EXTREMITY ANGIOGRAPHY Left 10/14/2016  Procedure: Lower Extremity Angiography;  Surgeon: Algernon Huxley, MD;  Location: [place] CV LAB;  Service: Cardiovascular;  Laterality: Left;   LOWER EXTREMITY ANGIOGRAPHY Left 12/20/2016   Procedure: Lower Extremity Angiography;  Surgeon: Algernon Huxley, MD;  Location: [place] CV LAB;  Service: Cardiovascular;  Laterality: Left;   LOWER EXTREMITY ANGIOGRAPHY Right 12/18/2018   Procedure: LOWER EXTREMITY ANGIOGRAPHY;  Surgeon: Algernon Huxley, MD;   Location: [place] CV LAB;  Service: Cardiovascular;  Laterality: Right;   LOWER EXTREMITY INTERVENTION  09/01/2016   Procedure: Lower Extremity Intervention;  Surgeon: Algernon Huxley, MD;  Location: [place] CV LAB;  Service: Cardiovascular;;   PROSTATE SURGERY     removal   TONSILLECTOMY     as a child    Prior to Admission medications   Medication Sig Start Date End Date Taking? Authorizing Provider  aspirin EC 81 MG tablet Take 1 tablet (81 mg total) by mouth daily. 12/18/18   Algernon Huxley, MD  atorvastatin (LIPITOR) 80 MG tablet Take 80 mg by mouth every evening. 07/31/18   [provider]  bisacodyl (DULCOLAX) 5 MG EC tablet Take 1 tablet (5 mg total) by mouth daily as needed for moderate constipation. Patient not taking: Reported on 03/06/2020 01/05/19   [name], MD  chlorthalidone (HYGROTON) 25 MG tablet Take 25 mg by mouth daily.    [provider]  Cholecalciferol (VITAMIN D-3) 25 MCG (1000 UT) CAPS Take 1,000 Units by mouth daily at 6 (six) AM.  Patient not taking: Reported on 03/06/2020    [provider]  divalproex (DEPAKOTE) 500 MG DR tablet Take 500 mg by mouth daily.  02/18/20   [provider]  Ensure Max Protein (ENSURE MAX PROTEIN) LIQD Take 330 mLs (11 oz total) by mouth 2 (two) times daily. 01/05/19   [name], MD  ferrous sulfate 325 (65 FE) MG tablet Take 1 tablet (325 mg total) by mouth daily with breakfast. 10/18/18   [name] Mandes, MD  gabapentin (NEURONTIN) 300 MG capsule Take 300 mg by mouth 3 (three) times daily.  02/07/19   [provider]  isosorbide mononitrate (IMDUR) 30 MG 24 hr tablet Take 60 mg by mouth daily. 11/07/19   [provider]  KLOR-CON M20 20 MEQ tablet Take 20 mEq by mouth 2 (two) times daily. 02/18/20   [provider]  Multiple Vitamin (MULTIVITAMIN WITH MINERALS) TABS tablet Take 1 tablet by mouth daily. 01/05/19   [name], MD  Rivaroxaban (XARELTO) 15 MG TABS tablet Take 1  tablet (15 mg total) by mouth daily with supper. Hold Xarelto for 3 days.  Start on January 09, 2019. 01/09/19   [name], MD  senna (SENOKOT) 8.6 MG TABS tablet Take 1 tablet (8.6 mg total) by mouth daily as needed for mild constipation. Patient not taking: Reported on 03/06/2020 01/05/19   [name], MD  zinc sulfate 220 (50 Zn) MG capsule Take 220 mg by mouth daily. Patient not taking: Reported on 03/06/2020    [provider]    Allergies Contrast media [iodinated diagnostic agents], Iohexol, Metrizamide, and Latex  Family History  Problem Relation Age of Onset   Hypertension Mother    Diabetes Mother    Prostate cancer Neg Hx    Bladder Cancer Neg Hx    Kidney disease Neg Hx     Social History Social History   Tobacco Use   Smoking status: Former Smoker    Packs/day: 0.25  Types: Cigarettes    Quit date: 10/14/1994    Years since quitting: 25.5   Smokeless tobacco: Never Used  Vaping Use   Vaping Use: Never used  Substance Use Topics   Alcohol use: No    Alcohol/week: 0.0 standard drinks   Drug use: No    Review of Systems  Constitutional: No fever/chills.  Positive for generalized weakness and hyperglycemia. Eyes: No visual changes. ENT: No sore throat. Cardiovascular: Denies chest pain. Respiratory: Denies shortness of breath. Gastrointestinal: No abdominal pain.  No nausea, no vomiting.  No diarrhea.  No constipation. Genitourinary: Negative for dysuria. Musculoskeletal: Negative for back pain. Skin: Negative for rash. Neurological: Negative for headaches, focal weakness or numbness.  ____________________________________________   PHYSICAL EXAM:  VITAL SIGNS: ED Triage Vitals  Enc Vitals Group     BP      Pulse      Resp      Temp      Temp src      SpO2      Weight      Height      Head Circumference      Peak Flow      Pain Score      Pain Loc      Pain Edu?      Excl. in [place]?     Constitutional: Alert and oriented  to person place and time. Eyes: Conjunctivae are normal. Head: Atraumatic. Nose: No congestion/rhinnorhea. Mouth/Throat: Mucous membranes are moist. Neck: Normal ROM Cardiovascular: Normal rate, regular rhythm. Grossly normal heart sounds. Respiratory: Normal respiratory effort.  No retractions. Lungs CTAB. Gastrointestinal: Soft and nontender. No distention. Genitourinary: deferred Musculoskeletal: Bilateral BKA. Neurologic:  Normal speech and language. No gross focal neurologic deficits are appreciated. Skin:  Skin is warm, dry and intact. No rash noted. Psychiatric: Mood and affect are normal. Speech and behavior are normal.  ____________________________________________   LABS (all labs ordered are listed, but only abnormal results are displayed)  Labs Reviewed  CBC WITH DIFFERENTIAL/PLATELET - Abnormal; Notable for the following components:      Result Value   WBC 11.3 (*)    Neutro Abs 8.1 (*)    Monocytes Absolute 1.1 (*)    All other components within normal limits  COMPREHENSIVE METABOLIC PANEL - Abnormal; Notable for the following components:   Potassium 5.2 (*)    Glucose, Bld 69 (*)    BUN 44 (*)    Creatinine, Ser 1.56 (*)    Calcium 8.5 (*)    Albumin 3.4 (*)    AST 60 (*)    GFR, Estimated 44 (*)    All other components within normal limits  URINALYSIS, COMPLETE (UACMP) WITH MICROSCOPIC - Abnormal; Notable for the following components:   Color, Urine YELLOW (*)    APPearance HAZY (*)    Hgb urine dipstick LARGE (*)    Protein, ur 30 (*)    Leukocytes,Ua MODERATE (*)    Bacteria, UA RARE (*)    All other components within normal limits  CBG MONITORING, ED - Abnormal; Notable for the following components:   Glucose-Capillary 134 (*)    All other components within normal limits  URINE CULTURE  RESPIRATORY PANEL BY RT PCR (FLU A&B, COVID)   ____________________________________________  EKG  ED ECG REPORT I, Blake Divine, the attending physician,  personally viewed and interpreted this ECG.   Date: 04/08/2020  EKG Time: 13:24  Rate: 58  Rhythm: sinus tachycardia  Axis: Normal  Intervals:none  ST&T Change: Nonspecific ST changes, similar to previous   PROCEDURES  Procedure(s) performed (including Critical Care):  Procedures   ____________________________________________   INITIAL IMPRESSION / ASSESSMENT AND PLAN / ED COURSE       85 year old male with past medical history of diabetes, atrial fibrillation on Xarelto, peripheral vascular disease status post bilateral BKA, CAD, and recurrent UTI who presents to the ED for altered mental status and hypoglycemia at home.  On arrival, patient is awake, alert and oriented x4 with no focal neurologic deficits.  He is unsure why he is here and denies any complaints.  Given his hyperglycemia we will screen for electrolyte abnormality or infectious process.  Also check CT head and EKG.  CT head is negative for acute process.  EKG shows sinus bradycardia with no evidence of ischemia.  Chest x-ray reviewed by me and shows no infiltrate, edema, or effusion.  UA is concerning for UTI which could be contributing to his hyperglycemia.  We will send urine for culture and treat with Rocephin.  His blood sugar remained stable for now but given UTI, weakness, and hypoglycemia, case discussed with hospitalist for admission.      ____________________________________________   FINAL CLINICAL IMPRESSION(S) / ED DIAGNOSES  Final diagnoses:  Acute cystitis without hematuria  Hypoglycemia  Altered mental status, unspecified altered mental status type     ED Discharge Orders    None       Note:  This document was prepared using Dragon voice recognition software and may include unintentional dictation errors.   Blake Divine, MD 04/08/20 1534

## 2020-04-08 NOTE — ED Triage Notes (Signed)
Pt here from home via [facility].   Home health nurse contacted EMS. Pt with chronic recurrent UTI's. Pt with strong smell of urine on arrival. CBG on ems arrival 61, after cookies cbg 98.   Pt and wife live together alone. Per EMS social work are working with son to find pt and wife placement.  EMS VSS.

## 2020-04-08 NOTE — ED Notes (Signed)
Patient transported to X-ray 

## 2020-04-08 NOTE — ED Notes (Signed)
Pt transported via [place]

## 2020-04-08 NOTE — Assessment & Plan Note (Signed)
Attribute to uti and diuretic therapy.  Lab Results  Component Value Date   CREATININE 1.56 (H) 04/08/2020   CREATININE 1.36 (H) 03/08/2020   CREATININE 1.40 (H) 03/07/2020  Avoid nephrotoxic meds and contrast.

## 2020-04-08 NOTE — Progress Notes (Signed)
Called pt's wife per request to give update on plan of care.

## 2020-04-08 NOTE — ED Notes (Signed)
Patient transported to MRI 

## 2020-04-08 NOTE — Assessment & Plan Note (Signed)
Blood pressure (!) 171/57, pulse 100, temperature 97.8 F (36.6 C), temperature source Oral, resp. rate 17, height 5\' 7"  (1.702 m), weight 81.6 kg, SpO2 (!) 88 %. BP is elevated and not sure if pt has taken his meds , will start prn hydralazine.  Will cutback on chlorthalidone to 12.5 Cont imdur.

## 2020-04-08 NOTE — Assessment & Plan Note (Signed)
Continue xarelto he is in  Sinus rhythm now.

## 2020-04-08 NOTE — H&P (Signed)
History and Physical    [name] IRW:[id number] DOB: 25-Mar-1935 DOA: 04/08/2020  PCP: [name], NP    Patient coming from:  Home    Chief Complaint:  AMS   HPI: [name] is a 85 y.o. male with medical history significant of a.fib on xarelto,DM II, PVD s/p BL BKA , CAD, Rec UTI seen in ed for AMS . Pt is alert and oriented in ed and gives history but is limited. He is pleasant and cooperative but is not able to tell us the exact details and says he is here for his uti.EMS was called as pt was altered and found to have low blood sugar of 61 and was given cookies and it was 98 on repeat check.  No reports of fall os syncopal presentation.     ED Course:  Vitals:   04/08/20 1515 04/08/20 1530 04/08/20 1545 04/08/20 1600  BP:      Pulse: (!) 55 (!) 48 (!) 54 100  Resp: 18 (!) 22 15 17   Temp:      TempSrc:      SpO2: 100% 92% 91% (!) 88%  Weight:      Height:      In ed pt is oriented but is slow to answer and had head ct which is negative for any acute findings.     Review of Systems:  Review of Systems  Unable to perform ROS: Other (AMS)     Past Medical History:  Diagnosis Date  . Arthritis   . Atrial fibrillation ([place])   . Carcinoma of prostate ([place])   . CHF (congestive heart failure) ([place])   . Chronic kidney disease   . Diabetes mellitus without complication ([place])   . ED (erectile dysfunction)   . Frequent urination   . Hyperlipidemia   . Hypertension   . Moderate mitral insufficiency   . Peripheral vascular disease ([place])   . Pneumonia 04/2016  . Prostate cancer ([place])   . Sleep apnea    OSA--USE C-PAP  . Ulcer of left foot due to type 2 diabetes mellitus ([place])   . Urinary stress incontinence, male     Past Surgical History:  Procedure Laterality Date  . AMPUTATION Left 01/12/2017   Procedure: AMPUTATION BELOW KNEE;  Surgeon: Algernon Huxley, MD;  Location: [facility] ORS;  Service: General;  Laterality: Left;  . AMPUTATION Right  12/27/2018   Procedure: AMPUTATION BELOW KNEE;  Surgeon: Algernon Huxley, MD;  Location: [facility] ORS;  Service: General;  Laterality: Right;  . APLIGRAFT PLACEMENT Left 10/15/2016   Procedure: APLIGRAFT PLACEMENT;  Surgeon: Albertine Patricia, DPM;  Location: [facility];  Service: Podiatry;  Laterality: Left;  necrotic ulcer  . CHOLECYSTECTOMY    . COLONOSCOPY WITH PROPOFOL N/A 01/02/2019   Procedure: COLONOSCOPY WITH PROPOFOL;  Surgeon: Lucilla Lame, MD;  Location: [facility] ENDOSCOPY;  Service: Endoscopy;  Laterality: N/A;  . COLONOSCOPY WITH PROPOFOL N/A 01/05/2019   Procedure: COLONOSCOPY WITH PROPOFOL;  Surgeon: Lucilla Lame, MD;  Location: [facility] ENDOSCOPY;  Service: Endoscopy;  Laterality: N/A;  . ESOPHAGOGASTRODUODENOSCOPY (EGD) WITH PROPOFOL N/A 01/02/2019   Procedure: ESOPHAGOGASTRODUODENOSCOPY (EGD) WITH PROPOFOL;  Surgeon: Lucilla Lame, MD;  Location: [facility] ENDOSCOPY;  Service: Endoscopy;  Laterality: N/A;  . EYE SURGERY Bilateral    Cataract Extraction with IOL  . HIP ARTHROPLASTY Right 10/13/2018   Procedure: ARTHROPLASTY BIPOLAR HIP (HEMIARTHROPLASTY);  Surgeon: Earnestine Leys, MD;  Location: [facility] ORS;  Service: Orthopedics;  Laterality: Right;  . IRRIGATION AND DEBRIDEMENT  FOOT Left 10/15/2016   Procedure: IRRIGATION AND DEBRIDEMENT FOOT-EXCISIONAL DEBRIDEMENT OF SKIN 3RD, 4TH AND 5TH TOES WITH APPLICATION OF APLIGRAFT;  Surgeon: Albertine Patricia, DPM;  Location: [facility] ORS;  Service: Podiatry;  Laterality: Left;  necrotic,gangrene  . IRRIGATION AND DEBRIDEMENT FOOT Left 12/09/2016   Procedure: IRRIGATION AND DEBRIDEMENT FOOT-MUSCLE/FASCIA, RESECTION OF FOURTH AND FIFTH METATARSAL NECROTIC BONE;  Surgeon: Albertine Patricia, DPM;  Location: [facility];  Service: Podiatry;  Laterality: Left;  . IRRIGATION AND DEBRIDEMENT FOOT Right 12/23/2018   Procedure: IRRIGATION AND DEBRIDEMENT FOOT and wound vac application;  Surgeon: Samara Deist, DPM;  Location: [facility] OR[facility];  Service: Podiatry;  Laterality: Right;  . LEFT  HEART CATH N/A 06/25/2019   Procedure: Left Heart Cath and Coronary Angiography;  Surgeon: Isaias Cowman, MD;  Location: [place] CV LAB;  Service: Cardiovascular;  Laterality: N/A;  . LEFT HEART CATH AND CORONARY ANGIOGRAPHY N/A 06/05/2018   Procedure: LEFT HEART CATH AND CORONARY ANGIOGRAPHY;  Surgeon: Yolonda Kida, MD;  Location: [place] CV LAB;  Service: Cardiovascular;  Laterality: N/A;  . LOWER EXTREMITY ANGIOGRAPHY Left 08/16/2016   Procedure: Lower Extremity Angiography;  Surgeon: Algernon Huxley, MD;  Location: [place] CV LAB;  Service: Cardiovascular;  Laterality: Left;  . LOWER EXTREMITY ANGIOGRAPHY Left 09/01/2016   Procedure: Lower Extremity Angiography;  Surgeon: Algernon Huxley, MD;  Location: [place] CV LAB;  Service: Cardiovascular;  Laterality: Left;  . LOWER EXTREMITY ANGIOGRAPHY Left 10/14/2016   Procedure: Lower Extremity Angiography;  Surgeon: Algernon Huxley, MD;  Location: [place] CV LAB;  Service: Cardiovascular;  Laterality: Left;  . LOWER EXTREMITY ANGIOGRAPHY Left 12/20/2016   Procedure: Lower Extremity Angiography;  Surgeon: Algernon Huxley, MD;  Location: [place] CV LAB;  Service: Cardiovascular;  Laterality: Left;  . LOWER EXTREMITY ANGIOGRAPHY Right 12/18/2018   Procedure: LOWER EXTREMITY ANGIOGRAPHY;  Surgeon: Algernon Huxley, MD;  Location: [place] CV LAB;  Service: Cardiovascular;  Laterality: Right;  . LOWER EXTREMITY INTERVENTION  09/01/2016   Procedure: Lower Extremity Intervention;  Surgeon: Algernon Huxley, MD;  Location: [place] CV LAB;  Service: Cardiovascular;;  . PROSTATE SURGERY     removal  . TONSILLECTOMY     as a child     reports that he quit smoking about 25 years ago. His smoking use included cigarettes. He smoked 0.25 packs per day. He has never used smokeless tobacco. He reports that he does not drink alcohol and does not use drugs.  Allergies  Allergen Reactions  . Contrast Media [Iodinated [employer]  [employer]] Shortness Of Breath    SOB  . Iohexol Shortness Of Breath     Desc: Respiratory Distress, Laryngedema, diaphoresis, Onset Date: [id number]   . Metrizamide Shortness Of Breath    SOB SOB SOB   . Latex Rash    Family History  Problem Relation Age of Onset  . Hypertension Mother   . Diabetes Mother   . Prostate cancer Neg Hx   . Bladder Cancer Neg Hx   . Kidney disease Neg Hx     Prior to Admission medications   Medication Sig Start Date End Date Taking? Authorizing Provider  aspirin EC 81 MG tablet Take 1 tablet (81 mg total) by mouth daily. 12/18/18   Algernon Huxley, MD  atorvastatin (LIPITOR) 80 MG tablet Take 80 mg by mouth every evening. 07/31/18   [provider]  bisacodyl (DULCOLAX) 5 MG EC tablet Take 1 tablet (5 mg total) by mouth daily  as needed for moderate constipation. Patient not taking: Reported on 03/06/2020 01/05/19   [name], MD  chlorthalidone (HYGROTON) 25 MG tablet Take 25 mg by mouth daily.    [provider]  Cholecalciferol (VITAMIN D-3) 25 MCG (1000 UT) CAPS Take 1,000 Units by mouth daily at 6 (six) AM.  Patient not taking: Reported on 03/06/2020    [provider]  divalproex (DEPAKOTE) 500 MG DR tablet Take 500 mg by mouth daily.  02/18/20   [provider]  Ensure Max Protein (ENSURE MAX PROTEIN) LIQD Take 330 mLs (11 oz total) by mouth 2 (two) times daily. 01/05/19   [name], MD  ferrous sulfate 325 (65 FE) MG tablet Take 1 tablet (325 mg total) by mouth daily with breakfast. 10/18/18   [name] Mandes, MD  gabapentin (NEURONTIN) 300 MG capsule Take 300 mg by mouth 3 (three) times daily.  02/07/19   [provider]  isosorbide mononitrate (IMDUR) 30 MG 24 hr tablet Take 60 mg by mouth daily. 11/07/19   [provider]  KLOR-CON M20 20 MEQ tablet Take 20 mEq by mouth 2 (two) times daily. 02/18/20   [provider]  Multiple Vitamin (MULTIVITAMIN WITH MINERALS) TABS tablet Take 1 tablet by mouth  daily. 01/05/19   [name], MD  Rivaroxaban (XARELTO) 15 MG TABS tablet Take 1 tablet (15 mg total) by mouth daily with supper. Hold Xarelto for 3 days.  Start on January 09, 2019. 01/09/19   [name], MD  senna (SENOKOT) 8.6 MG TABS tablet Take 1 tablet (8.6 mg total) by mouth daily as needed for mild constipation. Patient not taking: Reported on 03/06/2020 01/05/19   [name], MD  zinc sulfate 220 (50 Zn) MG capsule Take 220 mg by mouth daily. Patient not taking: Reported on 03/06/2020    [provider]    Physical Exam: Vitals:   04/08/20 1515 04/08/20 1530 04/08/20 1545 04/08/20 1600  BP:      Pulse: (!) 55 (!) 48 (!) 54 100  Resp: 18 (!) 22 15 17   Temp:      TempSrc:      SpO2: 100% 92% 91% (!) 88%  Weight:      Height:        Physical Exam Vitals and nursing note reviewed.  Constitutional:      Appearance: Normal appearance. He is obese.  HENT:     Head: Normocephalic and atraumatic.     Right Ear: External ear normal.     Left Ear: External ear normal.     Nose: Nose normal.     Mouth/Throat:     Mouth: Mucous membranes are dry.  Eyes:     Extraocular Movements: Extraocular movements intact.     Pupils: Pupils are equal, round, and reactive to light.  Cardiovascular:     Rate and Rhythm: Normal rate. Rhythm irregular.     Pulses: Normal pulses.     Heart sounds: Normal heart sounds.  Pulmonary:     Effort: Pulmonary effort is normal.     Breath sounds: Normal breath sounds.  Abdominal:     Palpations: Abdomen is soft.  Musculoskeletal:     Comments: BL BKA.  Neurological:     General: No focal deficit present.     Mental Status: He is alert and oriented to person, place, and time.  Psychiatric:        Mood and Affect: Mood normal.        Behavior: Behavior  normal.      Labs on Admission: I have personally reviewed following labs and imaging studies  CBC: Recent Labs  Lab 04/08/20 1330  WBC 11.3*  NEUTROABS 8.1*  HGB 13.3  HCT 40.7   MCV 94.9  PLT 283   Basic Metabolic Panel: Recent Labs  Lab 04/08/20 1330  NA 141  K 5.2*  CL 105  CO2 23  GLUCOSE 69*  BUN 44*  CREATININE 1.56*  CALCIUM 8.5*   GFR: Estimated Creatinine Clearance: 36.7 mL/min (A) (by C-G formula based on SCr of 1.56 mg/dL (H)). Liver Function Tests: Recent Labs  Lab 04/08/20 1330  AST 60*  ALT 25  ALKPHOS 104  BILITOT 1.1  PROT 7.2  ALBUMIN 3.4*   No results for input(s): LIPASE, AMYLASE in the last 168 hours. No results for input(s): AMMONIA in the last 168 hours. Coagulation Profile: No results for input(s): INR, PROTIME in the last 168 hours. Cardiac Enzymes: No results for input(s): CKTOTAL, CKMB, CKMBINDEX, TROPONINI in the last 168 hours. BNP (last 3 results) No results for input(s): PROBNP in the last 8760 hours. HbA1C: No results for input(s): HGBA1C in the last 72 hours. CBG: Recent Labs  Lab 04/08/20 1433  GLUCAP 134*   Lipid Profile: No results for input(s): CHOL, HDL, LDLCALC, TRIG, CHOLHDL, LDLDIRECT in the last 72 hours. Thyroid Function Tests: No results for input(s): TSH, T4TOTAL, FREET4, T3FREE, THYROIDAB in the last 72 hours. Anemia Panel: No results for input(s): VITAMINB12, FOLATE, FERRITIN, TIBC, IRON, RETICCTPCT in the last 72 hours. Urine analysis:    Component Value Date/Time   COLORURINE YELLOW (A) 04/08/2020 1333   APPEARANCEUR HAZY (A) 04/08/2020 1333   LABSPEC 1.015 04/08/2020 1333   PHURINE 7.0 04/08/2020 1333   GLUCOSEU NEGATIVE 04/08/2020 1333   HGBUR LARGE (A) 04/08/2020 1333   BILIRUBINUR NEGATIVE 04/08/2020 1333   KETONESUR NEGATIVE 04/08/2020 1333   PROTEINUR 30 (A) 04/08/2020 1333   NITRITE NEGATIVE 04/08/2020 1333   LEUKOCYTESUR MODERATE (A) 04/08/2020 1333    Intake/Output Summary (Last 24 hours) at 04/08/2020 1621 Last data filed at 04/08/2020 1602 Gross per 24 hour  Intake 100 ml  Output --  Net 100 ml   Lab Results  Component Value Date   CREATININE 1.56 (H)  04/08/2020   CREATININE 1.36 (H) 03/08/2020   CREATININE 1.40 (H) 03/07/2020    COVID-19 Labs  No results for input(s): DDIMER, FERRITIN, LDH, CRP in the last 72 hours.  Lab Results  Component Value Date   SARSCOV2NAA NEGATIVE 03/06/2020   [place] NEGATIVE 11/30/2019   SARSCOV2NAA NEGATIVE 06/24/2019   SARSCOV2NAA POSITIVE (A) 02/08/2019    Radiological Exams on Admission: DG Chest 2 View  Result Date: 04/08/2020 CLINICAL DATA:  Weakness EXAM: CHEST - 2 VIEW COMPARISON:  12/05/2019 FINDINGS: Low lung volumes. Bibasilar opacities. No significant pleural effusion. Cardiomegaly. No pneumothorax. IMPRESSION: Bibasilar atelectasis/consolidation.  Cardiomegaly. Electronically Signed   By: Macy Mis M.D.   On: 04/08/2020 14:20   CT Head Wo Contrast  Result Date: 04/08/2020 CLINICAL DATA:  Altered mental status. EXAM: CT HEAD WITHOUT CONTRAST TECHNIQUE: Contiguous axial images were obtained from the base of the skull through the vertex without intravenous contrast. COMPARISON:  CT head 03/06/2020 FINDINGS: Brain: Generalized atrophy. Patchy white matter hypodensity bilaterally unchanged. Negative for acute infarct, hemorrhage, mass. Vascular: Negative for hyperdense vessel Skull: Negative Sinuses/Orbits: Bilateral cataract extraction. Mild mucosal edema left maxillary sinus. Other: None IMPRESSION: No acute abnormality. Atrophy and chronic white matter changes stable from  the prior CT. Electronically Signed   By: Franchot Gallo M.D.   On: 04/08/2020 14:48    EKG: Independently reviewed.  Sinus rhythm 51 and Q wave in V1.    Assessment/Plan  AMS (altered mental status) Assessment & Plan D/D include metabolic encephalopathy.  We will also obtain MRI brain for further evaluation as pt also has dementia. Fall/ Aspiration precaution.    CAD (coronary artery disease) Assessment & Plan Cont asa 81/ Lipitor 80 mg/ and Imdur.  AM lipid panel.   Paroxysmal A-fib  [facility]) Assessment & Plan Continue xarelto he is in  Sinus rhythm now.    Essential hypertension Assessment & Plan Blood pressure (!) 171/57, pulse 100, temperature 97.8 F (36.6 C), temperature source Oral, resp. rate 17, height 5\' 7"  (1.702 m), weight 81.6 kg, SpO2 (!) 88 %. BP is elevated and not sure if pt has taken his meds , will start prn hydralazine.  Will cutback on chlorthalidone to 12.5 Cont imdur.   Type 2 diabetes mellitus treated with insulin (HCC) Assessment & Plan Carb consistent diet and last a1c is poorly controlled  At 8.0. We will start pt on SSI, a1c and follow .   AKI (acute kidney injury) [facility]) Assessment & Plan Attribute to uti and diuretic therapy.  Lab Results  Component Value Date   CREATININE 1.56 (H) 04/08/2020   CREATININE 1.36 (H) 03/08/2020   CREATININE 1.40 (H) 03/07/2020  Avoid nephrotoxic meds and contrast.     UTI (urinary tract infection) Assessment & Plan U/A showing moderate leucocytes pt given rocephin.  Cont and tailor therapy once c/s resulted.  Seizures ([place]) Assessment & Plan Cont depakote. And obtain level. Seizure, aspiration, fall precaution.   Bradycardia Assessment & Plan Pt is asymptomatic and attribute to imdur.  Echo on 11/2019: 1. Left ventricular ejection fraction, by estimation, is 50 to 55%. The  left ventricle has low normal function. Unable to exclude regional wall  motion abnormality.There is mild left ventricular hypertrophy. Left  ventricular diastolic parameters are  indeterminate.  2. Right ventricular systolic function was not well visualized. The right  ventricular size is normal.  3. Challenging image quality    DVT prophylaxis:  xarelto   Code Status:  DNR   Family Communication:  None at bedside   Disposition Plan:  SNF   Consults called:  none  Admission status: Status is: Inpatient  Remains inpatient appropriate because:Altered mental status, Unsafe d/c plan and Inpatient  level of care appropriate due to severity of illness   Dispo: The patient is from: Home              Anticipated d/c is to: SNF              Anticipated d/c date is: 3 days              Patient currently is not medically stable to d/c.   Para Skeans MD [facility] [phone] How to contact the [facility] Attending or Consulting provider [place] or covering provider during after hours [facility], for this patient?    1. Check the care team in [facility] and look for a) attending/consulting [facility] provider listed and b) the [facility] team listed 2. Log into [email or web address] and use [place] universal password to access. If you do not have the password, please contact the hospital operator. 3. Locate the [facility] provider you are looking for under [facility] and page to a number that you can be directly  reached. 4. If you still have difficulty reaching the provider, please page the [facility] (Director on Call) for the Hospitalists listed on amion for assistance. [email or web address] Password [email or web address] 04/08/2020, 4:21 PM

## 2020-04-08 NOTE — Assessment & Plan Note (Signed)
Pt is asymptomatic and attribute to imdur.  Echo on 11/2019: 1. Left ventricular ejection fraction, by estimation, is 50 to 55%. The  left ventricle has low normal function. Unable to exclude regional wall  motion abnormality.There is mild left ventricular hypertrophy. Left  ventricular diastolic parameters are  indeterminate.  2. Right ventricular systolic function was not well visualized. The right  ventricular size is normal.  3. Challenging image quality

## 2020-04-08 NOTE — Assessment & Plan Note (Signed)
D/D include metabolic encephalopathy.  We will also obtain MRI brain for further evaluation as pt also has dementia. Fall/ Aspiration precaution.

## 2020-04-08 NOTE — Assessment & Plan Note (Signed)
Carb consistent diet and last a1c is poorly controlled  At 8.0. We will start pt on SSI, a1c and follow .

## 2020-04-08 NOTE — ED Notes (Signed)
Pt given meal tray.

## 2020-04-09 DIAGNOSIS — R001 Bradycardia, unspecified: Secondary | ICD-10-CM

## 2020-04-09 DIAGNOSIS — N3000 Acute cystitis without hematuria: Secondary | ICD-10-CM | POA: Diagnosis not present

## 2020-04-09 DIAGNOSIS — R4182 Altered mental status, unspecified: Secondary | ICD-10-CM | POA: Diagnosis not present

## 2020-04-09 DIAGNOSIS — N179 Acute kidney failure, unspecified: Secondary | ICD-10-CM | POA: Diagnosis not present

## 2020-04-09 LAB — CBC
HCT: 38 % — ABNORMAL LOW (ref 39.0–52.0)
Hemoglobin: 12.8 g/dL — ABNORMAL LOW (ref 13.0–17.0)
MCH: 30.5 pg (ref 26.0–34.0)
MCHC: 33.7 g/dL (ref 30.0–36.0)
MCV: 90.5 fL (ref 80.0–100.0)
Platelets: 258 10*3/uL (ref 150–400)
RBC: 4.2 MIL/uL — ABNORMAL LOW (ref 4.22–5.81)
RDW: 14.5 % (ref 11.5–15.5)
WBC: 9.1 10*3/uL (ref 4.0–10.5)
nRBC: 0 % (ref 0.0–0.2)

## 2020-04-09 LAB — BASIC METABOLIC PANEL
Anion gap: 12 (ref 5–15)
BUN: 46 mg/dL — ABNORMAL HIGH (ref 8–23)
CO2: 26 mmol/L (ref 22–32)
Calcium: 8.8 mg/dL — ABNORMAL LOW (ref 8.9–10.3)
Chloride: 100 mmol/L (ref 98–111)
Creatinine, Ser: 1.57 mg/dL — ABNORMAL HIGH (ref 0.61–1.24)
GFR, Estimated: 43 mL/min — ABNORMAL LOW (ref >60)
Glucose, Bld: 124 mg/dL — ABNORMAL HIGH (ref 70–99)
Potassium: 4.6 mmol/L (ref 3.5–5.1)
Sodium: 138 mmol/L (ref 135–145)

## 2020-04-09 LAB — GLUCOSE, CAPILLARY
Glucose-Capillary: 99 mg/dL (ref 70–99)
Glucose-Capillary: 181 mg/dL — ABNORMAL HIGH (ref 70–99)
Glucose-Capillary: 193 mg/dL — ABNORMAL HIGH (ref 70–99)

## 2020-04-09 MED ORDER — SODIUM CHLORIDE 0.9 % IV SOLN
1.0000 g | INTRAVENOUS | Status: DC
  Administered 2020-04-09 – 2020-04-10 (×2): 1 g via INTRAVENOUS
  Filled 2020-04-09 (×2): qty 10
  Filled 2020-04-09: qty 1

## 2020-04-09 MED ORDER — INSULIN ASPART 100 UNIT/ML ~~LOC~~ SOLN: 0.0000 [IU] | Freq: Every day | SUBCUTANEOUS | Status: DC

## 2020-04-09 MED ORDER — HYDRALAZINE HCL 25 MG PO TABS: 25.0000 mg | ORAL_TABLET | Freq: Four times a day (QID) | ORAL | Status: DC | PRN

## 2020-04-09 MED ORDER — INSULIN ASPART 100 UNIT/ML ~~LOC~~ SOLN
0.0000 [IU] | Freq: Three times a day (TID) | SUBCUTANEOUS | Status: DC
  Administered 2020-04-09: 2 [IU] via SUBCUTANEOUS
  Administered 2020-04-10: 5 [IU] via SUBCUTANEOUS
  Administered 2020-04-10 (×2): 3 [IU] via SUBCUTANEOUS
  Filled 2020-04-09 (×4): qty 1

## 2020-04-09 MED ORDER — SODIUM CHLORIDE 0.9 % IV SOLN: INTRAVENOUS | Status: AC

## 2020-04-09 NOTE — Progress Notes (Signed)
PROGRESS NOTE    [name]  MAU:[id number] DOB: [birth year] DOA: 04/08/2020 PCP: [name], NP   Brief Narrative: Taken from H&P. [name] is a 85 y.o. male with medical history significant of a.fib on xarelto,DM II, PVD s/p BL BKA , CAD, Rec UTI seen in ed for AMS . Pt is alert and oriented in ed and gives history but is limited. He is pleasant and cooperative but is not able to tell us the exact details and says he is here for his uti.EMS was called as pt was altered and found to have low blood sugar of 61 and was given cookies and it was 98 on repeat check.  UA concerning for UTI, urine cultures pending, he was started on ceftriaxone. Apparently wife is unable to take care of him at home as he also has early dementia and other comorbidities. Waiting for placement at ALF.  Subjective: Patient was alert and oriented when seen this morning. No complaint. He wants to go home. Stating that he lives with his wife who helps him with ADLs. He also has bilateral prosthesis. Not sure why he was sent to the hospital, stating might be a urine infection.  Assessment & Plan:   Principal Problem:   AMS (altered mental status) Active Problems:   Type 2 diabetes mellitus treated with insulin (HCC)   Essential hypertension   AKI (acute kidney injury) (HCC)   Paroxysmal A-fib (HCC)   Bradycardia   Seizures (HCC)   CAD (coronary artery disease)   UTI (urinary tract infection)  Altered Mental Status.  Resolved.  Most likely metabolic as he initially had hypoglycemia and also concern of UTI which can present in elderly as altered mental status. MRI without any acute abnormality. Patient has a history of at times becoming confused.  UTI.  Patient unable to give me any urinary symptoms.  Mild leukocytosis on arrival which has been resolved.  UA look infected.  Pending culture results. -Continue with ceftriaxone. -Follow-up urine culture results  CAD.  No chest  pain. -Continue home dose of aspirin, Lipitor and Imdur.  Essential Hypertension.  Blood pressure elevated. Holding home dose of diltiazem and metoprolol for concern of bradycardia. -Continue home dose of chlorthalidone. -As needed hydralazine for systolic above 563.  History of A. fib.  Appears in sinus rhythm with heart rate in low 50s. -Holding home dose of amiodarone, diltiazem and metoprolol. -Continue with Xarelto.  Insulin dependent type 2 DM.  Concern of hypoglycemia.  Patient takes insulin glargine 28 units at bedtime, NovoLog 4 units with meals along with Metformin.  A1c of 8 in October 2021. -Sensitive SSI.  AKI with CKD stage IIIa.  Creatinine at 1.57 with BUN of 46, baseline around 1.3.  Looks little dry. -Gentle IV hydration. -Continue to monitor. -Avoid nephrotoxins  Seizure disorder.  No acute concern. -Continue home dose of Depakote.  Objective: Vitals:   04/09/20 0405 04/09/20 0848 04/09/20 1137 04/09/20 1520  BP: (!) 145/51 (!) 177/60 (!) 168/56 (!) 187/87  Pulse: (!) 54 (!) 52 (!) 52 66  Resp: 18  16 16   Temp: 98.2 F (36.8 C) 98.2 F (36.8 C) 98 F (36.7 C) 99 F (37.2 C)  TempSrc: Oral Oral Oral Oral  SpO2: 95% 97% 97% 97%  Weight:      Height:        Intake/Output Summary (Last 24 hours) at 04/09/2020 1537 Last data filed at 04/09/2020 1500 Gross per 24 hour  Intake 650 ml  Output 1675 ml  Net -1025 ml   Filed Weights   04/08/20 1326  Weight: 81.6 kg    Examination:  General exam: Appears calm and comfortable  Respiratory system: Clear to auscultation. Respiratory effort normal. Cardiovascular system: S1 & S2 heard, RRR.  Gastrointestinal system: Soft, nontender, nondistended, bowel sounds positive. Central nervous system: Alert and oriented. No focal neurological deficits Extremities: Bilateral BKA Psychiatry: Judgement and insight appear normal. Mood & affect appropriate.    DVT prophylaxis: Xarelto Code Status: Full Family  Communication: Son was updated on phone. Disposition Plan:  Status is: Inpatient  Remains inpatient appropriate because:Unsafe d/c plan   Dispo: The patient is from: Home              Anticipated d/c is to: ALF              Anticipated d/c date is: 1 day              Patient currently is medically stable to d/c.  Apparently wife is unable to take care of him at home and they have already started work-up for ALF. Plan is to send him to ALF from hospital for TOC.   Consultants:   None  Procedures:  Antimicrobials:  Ceftriaxone  Data Reviewed: I have personally reviewed following labs and imaging studies  CBC: Recent Labs  Lab 04/08/20 1330 04/09/20 0951  WBC 11.3* 9.1  NEUTROABS 8.1*  --   HGB 13.3 12.8*  HCT 40.7 38.0*  MCV 94.9 90.5  PLT 254 253   Basic Metabolic Panel: Recent Labs  Lab 04/08/20 1330 04/09/20 0951  NA 141 138  K 5.2* 4.6  CL 105 100  CO2 23 26  GLUCOSE 69* 124*  BUN 44* 46*  CREATININE 1.56* 1.57*  CALCIUM 8.5* 8.8*   GFR: Estimated Creatinine Clearance: 36.5 mL/min (A) (by C-G formula based on SCr of 1.57 mg/dL (H)). Liver Function Tests: Recent Labs  Lab 04/08/20 1330  AST 60*  ALT 25  ALKPHOS 104  BILITOT 1.1  PROT 7.2  ALBUMIN 3.4*   No results for input(s): LIPASE, AMYLASE in the last 168 hours. No results for input(s): AMMONIA in the last 168 hours. Coagulation Profile: No results for input(s): INR, PROTIME in the last 168 hours. Cardiac Enzymes: No results for input(s): CKTOTAL, CKMB, CKMBINDEX, TROPONINI in the last 168 hours. BNP (last 3 results) No results for input(s): PROBNP in the last 8760 hours. HbA1C: No results for input(s): HGBA1C in the last 72 hours. CBG: Recent Labs  Lab 04/08/20 1433 04/08/20 2012 04/09/20 0635  GLUCAP 134* 88 99   Lipid Profile: Recent Labs    04/08/20 2053  CHOL 154  HDL 39*  LDLCALC 101*  TRIG 72  CHOLHDL 3.9   Thyroid Function Tests: No results for input(s): TSH,  T4TOTAL, FREET4, T3FREE, THYROIDAB in the last 72 hours. Anemia Panel: No results for input(s): VITAMINB12, FOLATE, FERRITIN, TIBC, IRON, RETICCTPCT in the last 72 hours. Sepsis Labs: No results for input(s): PROCALCITON, LATICACIDVEN in the last 168 hours.  Recent Results (from the past 240 hour(s))  Respiratory Panel by RT PCR (Flu A&B, Covid) - Nasopharyngeal Swab     Status: None   Collection Time: 04/08/20  1:30 PM   Specimen: Nasopharyngeal Swab  Result Value Ref Range Status   SARS Coronavirus 2 by RT PCR NEGATIVE NEGATIVE Final    Comment: (NOTE) SARS-CoV-2 target nucleic acids are NOT DETECTED.  The SARS-CoV-2 RNA is generally detectable in upper  respiratoy specimens during the acute phase of infection. The lowest concentration of SARS-CoV-2 viral copies this assay can detect is 131 copies/mL. A negative result does not preclude SARS-Cov-2 infection and should not be used as the sole basis for treatment or other patient management decisions. A negative result may occur with  improper specimen collection/handling, submission of specimen other than nasopharyngeal swab, presence of viral mutation(s) within the areas targeted by this assay, and inadequate number of viral copies (<131 copies/mL). A negative result must be combined with clinical observations, patient history, and epidemiological information. The expected result is Negative.  Fact Sheet for Patients:  PinkCheek.be  Fact Sheet for Healthcare Providers:  GravelBags.it  This test is no t yet approved or cleared by the [place] FDA and  has been authorized for detection and/or diagnosis of SARS-CoV-2 by FDA under an Emergency Use Authorization (EUA). This EUA will remain  in effect (meaning this test can be used) for the duration of the COVID-19 declaration under Section 564(b)(1) of the Act, 21 U.S.C. section 360bbb-3(b)(1), unless the authorization  is terminated or revoked sooner.     Influenza A by PCR NEGATIVE NEGATIVE Final   Influenza B by PCR NEGATIVE NEGATIVE Final    Comment: (NOTE) The Xpert Xpress SARS-CoV-2/FLU/RSV assay is intended as an aid in  the diagnosis of influenza from Nasopharyngeal swab specimens and  should not be used as a sole basis for treatment. Nasal washings and  aspirates are unacceptable for Xpert Xpress SARS-CoV-2/FLU/RSV  testing.  Fact Sheet for Patients: PinkCheek.be  Fact Sheet for Healthcare Providers: GravelBags.it  This test is not yet approved or cleared by the [place] FDA and  has been authorized for detection and/or diagnosis of SARS-CoV-2 by  FDA under an Emergency Use Authorization (EUA). This EUA will remain  in effect (meaning this test can be used) for the duration of the  Covid-19 declaration under Section 564(b)(1) of the Act, 21  U.S.C. section 360bbb-3(b)(1), unless the authorization is  terminated or revoked. Performed at [facility], [address], [place], [place] [address]      Radiology Studies: DG Chest 2 View  Result Date: 04/08/2020 CLINICAL DATA:  Weakness EXAM: CHEST - 2 VIEW COMPARISON:  12/05/2019 FINDINGS: Low lung volumes. Bibasilar opacities. No significant pleural effusion. Cardiomegaly. No pneumothorax. IMPRESSION: Bibasilar atelectasis/consolidation.  Cardiomegaly. Electronically Signed   By: Macy Mis M.D.   On: 04/08/2020 14:20   CT Head Wo Contrast  Result Date: 04/08/2020 CLINICAL DATA:  Altered mental status. EXAM: CT HEAD WITHOUT CONTRAST TECHNIQUE: Contiguous axial images were obtained from the base of the skull through the vertex without intravenous contrast. COMPARISON:  CT head 03/06/2020 FINDINGS: Brain: Generalized atrophy. Patchy white matter hypodensity bilaterally unchanged. Negative for acute infarct, hemorrhage, mass. Vascular: Negative for hyperdense vessel  Skull: Negative Sinuses/Orbits: Bilateral cataract extraction. Mild mucosal edema left maxillary sinus. Other: None IMPRESSION: No acute abnormality. Atrophy and chronic white matter changes stable from the prior CT. Electronically Signed   By: Franchot Gallo M.D.   On: 04/08/2020 14:48   MR BRAIN WO CONTRAST  Result Date: 04/08/2020 CLINICAL DATA:  Delirium. EXAM: MRI HEAD WITHOUT CONTRAST TECHNIQUE: Multiplanar, multiecho pulse sequences of the brain and surrounding structures were obtained without intravenous contrast. COMPARISON:  Noncontrast head CT performed earlier the same day 04/08/2020. Brain MRI/MRA 06/05/2018. FINDINGS: Brain: Moderate cerebral atrophy. Moderate multifocal T2/FLAIR hyperintensity within the cerebral white matter is nonspecific, but compatible with chronic small vessel ischemic disease. There  are a few nonspecific chronic supratentorial microhemorrhages. 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 There is no acute infarct. No evidence of intracranial mass. No extra-axial fluid collection. No midline shift. Vascular: Expected proximal arterial flow voids. Skull and upper cervical spine: No focal marrow lesion. Mild ligamentous hypertrophy/pannus formation posterior to the dens. Sinuses/Orbits: Visualized orbits show no acute finding. Mild left maxillary sinus mucosal thickening. IMPRESSION: No evidence of acute intracranial abnormality, including acute infarction. Moderate cerebral atrophy and chronic small vessel ischemic disease, stable as compared to the brain MRI of 06/05/2018. 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 Electronically Signed   By: Kellie Simmering DO   On: 04/08/2020 18:59    Scheduled Meds: . aspirin EC  81 mg Oral Daily  . atorvastatin  80 mg Oral QPM  . chlorthalidone  12.5 mg Oral Daily  . cholecalciferol  1,000 Units Oral Q0600  . divalproex  500 mg Oral Daily  . gabapentin  300 mg Oral  TID  . isosorbide mononitrate  60 mg Oral Daily  . Ensure Max Protein  11 oz Oral BID  . Rivaroxaban  15 mg Oral Q supper  . zinc sulfate  220 mg Oral Daily   Continuous Infusions: . cefTRIAXone (ROCEPHIN)  IV 1 g (04/09/20 1400)     LOS: 1 day   Time spent: 35 minutes.  Lorella Nimrod, MD [facility]  If 7PM-7AM, please contact night-coverage [email or web address]  04/09/2020, 3:37 PM   This record has been created using Systems analyst. Errors have been sought and corrected,but may not always be located. Such creation errors do not reflect on the standard of care.

## 2020-04-09 NOTE — Plan of Care (Signed)

## 2020-04-09 NOTE — Evaluation (Signed)
Physical Therapy Evaluation Patient Details Name: [name] MRN: [id number] DOB: 05/20/1935 Today's Date: 04/09/2020   History of Present Illness  presented to ER secondary to AMS, decreased FSBS; admitted for management of AMS, possible UTI.  Clinical Impression  Upon evaluation, patient alert and oriented to self, general location; follows simple commands, but very HOH and requires frequent redirection to task (generally perseverative on calling wife).  Bilat UE/LE strength and ROM grossly symmetrical and WFL; limited bilat UE fine motor control evident (baseline for patient). Currently requiring max/dep assist to don/doff bilat prostheses (prefers to complete sitting edge of bed); good awareness of sequencing and steps of process.  Able to scoot laterally edge of bed, close sup; unable to fully unweight buttocks from bed surface, but does scoot with increased time, heavy reliance on UEs to complete.  Limited active use of bilat LEs with transfer.  OOB attempts declined per nursing request due to persistent AMS; will assess next session as appropriate.  Also able to complete partial sit/stand from edge of bed (pulling on counter/sink to simulate home environment), mod assist for lift off, stabilization; facilitating bilat knee extension for optmial safety.  Unable to achieve full postural extension; unable to release UE support.  Additional standing/gait deferred, as patient WC level at baseline. Would benefit from skilled PT to address above deficits and promote optimal return to PLOF.; Recommend transition to HHPT upon discharge from acute hospitalization.     Follow Up Recommendations      Equipment Recommendations       Recommendations for Other Services       Precautions / Restrictions Precautions Precautions: Fall Restrictions Weight Bearing Restrictions: No      Mobility  Bed Mobility Overal bed mobility: Needs Assistance Bed Mobility: Supine to Sit;Sit to  Supine;Rolling Rolling: Modified independent (Device/Increase time)   Supine to sit: Supervision Sit to supine: Supervision   General bed mobility comments: heavy use of bedrails to complete    Transfers Overall transfer level: Needs assistance     Sit to Stand: Mod assist         General transfer comment: sit/stand pulling on sink, mod assist for lift off, stabilization; facilitating bilat knee extension for optmial safety.  Unable to achieve full postural extension; unable to release UE support  Ambulation/Gait             General Gait Details: unsafe/unable; non-ambulatory at baseline  Stairs            Wheelchair Mobility    Modified Rankin (Stroke Patients Only)       Balance Overall balance assessment: Needs assistance Sitting-balance support: No upper extremity supported;Feet supported Sitting balance-Leahy Scale: Good Sitting balance - Comments: sitting functional reach approx 4"; fair/good awareness of limits of stability, using contralateral UE support to stabilize and extend reach as appropriate   Standing balance support: Bilateral upper extremity supported Standing balance-Leahy Scale: Poor                               Pertinent Vitals/Pain Pain Assessment: No/denies pain    Home Living Family/patient expects to be discharged to:: Private residence Living Arrangements: Spouse/significant other Available Help at Discharge: Family Type of Home: House Home Access: Ramped entrance     Home Layout: One level Home Equipment: Cane - single point;Walker - 2 wheels;Grab bars - toilet;Wheelchair - Sport and exercise psychologist Comments: Per chart, patient was actively participating with [facility] prior to  admission; HHPT/CSW was working with family to facilitate transition to ALF [facility])    Prior Function Level of Independence: Needs assistance         Comments: Patient limited historian, will verify with family as  available.  Does suggest WC level as primary mobility, scoot/SB transfer to/from seating surfaces; partial standing (pulling on grab bar) to manage clothing with toilet needs.  Utilizes bilat UE/LEs to self-propel manual WC.  Does endose fall history; unable to quantify.     Hand Dominance   Dominant Hand: Right    Extremity/Trunk Assessment   Upper Extremity Assessment Upper Extremity Assessment: Generalized weakness (grossly at least 4-/5 throughout)    Lower Extremity Assessment Lower Extremity Assessment: Generalized weakness (grossly at least 4-/5 throughout)       Communication   Communication: HOH  Cognition Arousal/Alertness: Awake/alert Behavior During Therapy: WFL for tasks assessed/performed Overall Cognitive Status: No family/caregiver present to determine baseline cognitive functioning                                 General Comments: oriented to self, general location only; follows some simple, verbal commands; exceptionally HOH. Generally perseverative at times on calling wife (assisted with during session).  Limited insight/safety awareness apprent.      General Comments      Exercises Other Exercises Other Exercises: Lateral scooting edge of bed, close sup; unable to fully clear buttocks from bed, but does unweight and scoot without physical assist. Very heavily dependant on UE support to complete; limited active use/WBing bilat LEs Other Exercises: Max/dep assist to don bilat prosthesis; patient with good awaress of steps/sequencing, but requires physical assist due to decreased fine motor control/coordination bila tUEs   Assessment/Plan    PT Assessment Patient needs continued PT services  PT Problem List Decreased strength;Decreased activity tolerance;Decreased balance;Decreased mobility;Decreased cognition;Decreased knowledge of use of DME;Decreased safety awareness;Decreased knowledge of precautions       PT Treatment Interventions DME  instruction;Functional mobility training;Therapeutic activities;Therapeutic exercise;Balance training;Cognitive remediation;Patient/family education    PT Goals (Current goals can be found in the Care Plan section)  Acute Rehab PT Goals Patient Stated Goal: to get these legs going again PT Goal Formulation: With patient Time For Goal Achievement: 04/23/20 Potential to Achieve Goals: Fair    Frequency Min 2X/week   Barriers to discharge Decreased caregiver support      Co-evaluation               AM-PAC PT "6 Clicks" Mobility  Outcome Measure Help needed turning from your back to your side while in a flat bed without using bedrails?: None Help needed moving from lying on your back to sitting on the side of a flat bed without using bedrails?: A Little Help needed moving to and from a bed to a chair (including a wheelchair)?: A Little Help needed standing up from a chair using your arms (e.g., wheelchair or bedside chair)?: A Lot Help needed to walk in hospital room?: Total Help needed climbing 3-5 steps with a railing? : Total 6 Click Score: 14    End of Session   Activity Tolerance: Patient tolerated treatment well Patient left: in bed;with call bell/phone within reach;with bed alarm set Nurse Communication: Mobility status PT Visit Diagnosis: Difficulty in walking, not elsewhere classified (R26.2);Muscle weakness (generalized) (M62.81)    Time: 5784-6962 PT Time Calculation (min) (ACUTE ONLY): 31 min   Charges:   PT Evaluation $  PT Eval Moderate Complexity: 1 Mod PT Treatments $Therapeutic Activity: 8-22 mins        Hailyn Zarr H. Owens Shark, PT, DPT, NCS 04/09/20, 12:22 PM [phone]

## 2020-04-09 NOTE — TOC Initial Note (Addendum)
Transition of Care [facility]) - Initial/Assessment Note    Patient Details  Name: [name] MRN: [id number] Date of Birth: 11-21-1934  Transition of Care [facility]) CM/SW Contact:    Jesse Chroman, LCSW Phone Number: 04/09/2020, 9:48 AM  Clinical Narrative:  CSW met with patient. No supports at bedside. Patient unable to answer CSW's questions about PCP, home health, etc and was hyper-focused on calling his wife. CSW dialed phone number for him and he was able to reach her. CSW called patient's son. CSW notified him that [name] from [facility] called this morning and they are working on admitting patient from the hospital. Son confirmed plan. CSW sent [name] requested documents. [facility] social worker has been working on this from home. CSW asked son if patient and his wife were aware. Son stated that they had a conversation about it on Monday and patient was very upset. Son stated that wife is in early stages of dementia and cannot provide the level of care he needs at home. Patient does not understand why wife cannot care for him at home. [facility] said they would still follow him at the ALF. CSW spoke with [facility] social worker, [name], that was working on the placement from home. She confirmed that it is not safe for patient to be at home as wife is unable to care for him. She also confirmed that patient is often confused although "he tries very hard not to be." [name] stated that son [name] is [place]. No documents scanned into chart. CSW reviewed palliative note from 12/03/19: "son [name] is main contact person per spouse, [name]." No further concerns. CSW encouraged patient's son to contact CSW as needed. CSW will continue to follow patient and his son for support and facilitate discharge to [facility] once medically stable.       2:19 pm: Per MD, patient more oriented this afternoon. Met with him to discuss ALF placement. He is only willing to go if he wife  goes with him. CSW spoke to wife who confirmed she cannot take care of him at home. Attempted calling son. No answer. Will try again tomorrow.    Expected Discharge Plan: Assisted Living Barriers to Discharge: Continued Medical Work up   Patient Goals and CMS Choice Patient states their goals for this hospitalization and ongoing recovery are:: Patient not fully oriented.      Expected Discharge Plan and Services Expected Discharge Plan: Assisted Living       Living arrangements for the past 2 months: Single Family Home                                      Prior Living Arrangements/Services Living arrangements for the past 2 months: Single Family Home Lives with:: Spouse Patient language and need for interpreter reviewed:: Yes Do you feel safe going back to the place where you live?: Yes      Need for Family Participation in Patient Care: Yes (Comment) Care giver support system in place?: Yes (comment)   Criminal Activity/Legal Involvement Pertinent to Current Situation/Hospitalization: No - Comment as needed  Activities of Daily Living Home Assistive Devices/Equipment: Prosthesis ADL Screening (condition at time of admission) Patient's cognitive ability adequate to safely complete daily activities?: Yes Is the patient deaf or have difficulty hearing?: No Does the patient have difficulty seeing, even when wearing glasses/contacts?: No Does the patient have difficulty  concentrating, remembering, or making decisions?: No Patient able to express need for assistance with ADLs?: Yes Does the patient have difficulty dressing or bathing?: No Independently performs ADLs?: Yes (appropriate for developmental age) Does the patient have difficulty walking or climbing stairs?: No Weakness of Legs: None Weakness of Arms/Hands: None  Permission Sought/Granted Permission sought to share information with : Facility Sport and exercise psychologist, Family Supports    Share Information  with NAME: [name]  Permission granted to share info w AGENCY: [facility]  Permission granted to share info w Relationship: Son  Permission granted to share info w Contact Information: [phone]  Emotional Assessment Appearance:: Appears stated age Attitude/Demeanor/Rapport: Unable to Assess Affect (typically observed): Unable to Assess Orientation: : Oriented to Self, Oriented to Place Alcohol / Substance Use: Not Applicable Psych Involvement: No (comment)  Admission diagnosis:  Hypoglycemia [E16.2] Acute cystitis without hematuria [N30.00] Altered mental status, unspecified altered mental status type [R41.82] AMS (altered mental status) [R41.82] Patient Active Problem List   Diagnosis Date Noted  . AMS (altered mental status) 04/08/2020  . CAD (coronary artery disease) 03/06/2020  . Personal history of COVID-19 03/06/2020  . Acute metabolic encephalopathy 20/35/5974  . UTI (urinary tract infection) 03/06/2020  . (HFpEF) heart failure with preserved ejection fraction ([place]) 03/06/2020  . Goals of care, counseling/discussion   . Palliative care by specialist   . Seizures ([place]) 11/30/2019  . ACS (acute coronary syndrome) ([place]) 06/24/2019  . Bradycardia 05/16/2019  . Paroxysmal A-fib ([place]) 03/27/2019  . Hx of BKA, right ([place]) 03/02/2019  . Severe protein-calorie malnutrition ([place]) 02/16/2019  . Acute systolic CHF (congestive heart failure) ([place]) 02/16/2019  . Acute respiratory failure with hypoxia ([place]) 02/08/2019  . Pneumonia due to COVID-19 virus 02/08/2019  . Below-knee amputation of right lower extremity ([place]) 01/15/2019  . Polyp of descending colon   . Iron deficiency anemia due to chronic blood loss   . Sacral decubitus ulcer, stage II ([place]) 12/26/2018  . Acute respiratory failure ([place]) 12/20/2018  . Femur fracture ([place]) 10/13/2018  . Chest pain 06/03/2018  . NSTEMI (non-ST elevated myocardial infarction) ([place]) 01/16/2018  . Acute CHF  (congestive heart failure) ([place]) 11/23/2017  . Atrial fibrillation with rapid ventricular response ([place]) 10/15/2017  . BKA stump complication ([place]) 16/38/4536  . 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 S/P BKA (below knee amputation) unilateral, left ([place]) 02/01/2017  . Chronic diastolic heart failure ([place]) 01/21/2017  . Pressure injury of skin 01/18/2017  . Atherosclerosis of artery of extremity with gangrene ([place]) 01/05/2017  . Diabetic foot ulcer ([place]) 12/29/2016  . Ataxia 10/21/2016  . Atherosclerosis of native arteries of the extremities with ulceration ([place]) 10/12/2016  . History of femoral angiogram 09/06/2016  . Left leg pain 09/06/2016  . Leukocytosis 09/06/2016  . Generalized weakness 09/06/2016  . AKI (acute kidney injury) ([place]) 09/06/2016  . Atrial fibrillation, chronic ([place]) 08/30/2016  . Pneumonia 05/19/2016  . Hyperlipidemia 04/20/2016  . Back pain 04/19/2016  . Type 2 diabetes mellitus treated with insulin ([place]) 04/19/2016  . Essential hypertension 04/19/2016  . PAD (peripheral artery disease) ([place]) 04/19/2016  . Erectile dysfunction following radical prostatectomy 06/25/2015  . History of prostate cancer 12/23/2014  . Incontinence 12/23/2014   PCP:  [name], NP Pharmacy:   [facility] [address], [place] - [place] [address] Phone: [phone] Fax: [phone]  [facility] [place], [place]  [address] [place] [address] Phone: [phone] Fax: [phone]  [place], [place] - [address] [facility] [address] [place] [address] Phone: [phone] Fax: [phone]  [id number] [place]Phone: [phone]: [phone]    Social Determinants of Health (SDOH) Interventions     Readmission Risk Interventions Readmission Risk Prevention Plan 03/07/2020 02/12/2019 12/25/2018  Transportation Screening Complete - Complete  PCP or Specialist Appt within 3-5 Days Complete - Complete  HRI or Home Care Consult Complete - Complete  Social Work Consult for [place]anning/Counseling Complete - Complete  Palliative Care Screening Not Applicable - Not Applicable  Medication Review (Press photographer Complete - Complete  PCP or Specialist appointment within 3-5 days of discharge - Not Complete -  PCP/Specialist Appt Not Complete comments - not ready for dc, family requests SNF placement -  HRI or Home Care Consult - Complete -  SW Recovery Care/Counseling Consult - Complete -  Palliative Care Screening - Not Applicable -  [place]- Complete -  Some recent data might be hidden

## 2020-04-09 NOTE — Care Management CC44 (Signed)
Condition Code 44 Documentation Completed  Patient Details  Name: [name] MRN: [id number] Date of Birth: 14-Sep-1934   Condition Code 44 given:  Yes Patient signature on Condition Code 44 notice:  Yes Documentation of 2 MD's agreement:  Yes Code 44 added to claim:  Yes    Iona Beard, [place] 04/09/2020, 5:20 PM

## 2020-04-09 NOTE — Progress Notes (Signed)
[name] Rayetta Pigg) called by this nurse, Mrs. Pottinger verbalize pt does not have any hearing aides.

## 2020-04-09 NOTE — Care Management Obs Status (Signed)
[place] NOTIFICATION   Patient Details  Name: [name] MRN: [id number] Date of Birth: [birth year]   Medicare Observation Status Notification Given:  Yes    Iona Beard, [place] 04/09/2020, 5:18 PM

## 2020-04-10 DIAGNOSIS — I10 Essential (primary) hypertension: Secondary | ICD-10-CM | POA: Diagnosis not present

## 2020-04-10 DIAGNOSIS — N3000 Acute cystitis without hematuria: Secondary | ICD-10-CM | POA: Diagnosis not present

## 2020-04-10 DIAGNOSIS — N179 Acute kidney failure, unspecified: Secondary | ICD-10-CM | POA: Diagnosis not present

## 2020-04-10 DIAGNOSIS — R4182 Altered mental status, unspecified: Secondary | ICD-10-CM | POA: Diagnosis not present

## 2020-04-10 LAB — BASIC METABOLIC PANEL
Anion gap: 9 (ref 5–15)
BUN: 45 mg/dL — ABNORMAL HIGH (ref 8–23)
CO2: 25 mmol/L (ref 22–32)
Calcium: 8.3 mg/dL — ABNORMAL LOW (ref 8.9–10.3)
Chloride: 104 mmol/L (ref 98–111)
Creatinine, Ser: 1.46 mg/dL — ABNORMAL HIGH (ref 0.61–1.24)
GFR, Estimated: 47 mL/min — ABNORMAL LOW (ref >60)
Glucose, Bld: 169 mg/dL — ABNORMAL HIGH (ref 70–99)
Potassium: 4 mmol/L (ref 3.5–5.1)
Sodium: 138 mmol/L (ref 135–145)

## 2020-04-10 LAB — GLUCOSE, CAPILLARY
Glucose-Capillary: 204 mg/dL — ABNORMAL HIGH (ref 70–99)
Glucose-Capillary: 256 mg/dL — ABNORMAL HIGH (ref 70–99)
Glucose-Capillary: 205 mg/dL — ABNORMAL HIGH (ref 70–99)

## 2020-04-10 MED ORDER — ENSURE MAX PROTEIN PO LIQD: 11.0000 [oz_av] | Freq: Two times a day (BID) | ORAL | Status: DC

## 2020-04-10 MED ORDER — SODIUM CHLORIDE 0.9 % IV SOLN
INTRAVENOUS | Status: DC | PRN
  Administered 2020-04-10: 1000 mL via INTRAVENOUS

## 2020-04-10 MED ORDER — BISACODYL 5 MG PO TBEC: 5.0000 mg | DELAYED_RELEASE_TABLET | Freq: Every day | ORAL | 0 refills | Status: DC | PRN

## 2020-04-10 MED ORDER — METOPROLOL SUCCINATE ER 25 MG PO TB24
25.0000 mg | ORAL_TABLET | Freq: Every day | ORAL | 0 refills | Status: DC
Start: 2020-04-10 — End: 2020-12-15

## 2020-04-10 MED ORDER — CEFDINIR 300 MG PO CAPS: 300.0000 mg | ORAL_CAPSULE | Freq: Two times a day (BID) | ORAL | 0 refills | Status: AC

## 2020-04-10 NOTE — TOC Progression Note (Addendum)
Transition of Care [facility]) - Progression Note    Patient Details  Name: [name] MRN: [id number] Date of Birth: 1934-09-20  Transition of Care [facility]) CM/SW [place], LCSW Phone Number: 04/10/2020, 9:14 AM  Clinical Narrative: CSW called patient's son to follow up on discussion with patient yesterday. He said he is working with Systems analyst at [facility] to bring patient's wife in with her so he will agree to go. He is unsure how long this will take but is supposed to have an update from [place] later this morning. CSW explained observation status and the importance of a plan ASAP so we can discharge him. CSW left voicemail for [employer].    11:56 am: CSW spoke with [place]. They are planning on coming to assess patient for their ALF tomorrow. CSW explained observation status and 20% copay per day that he will accrue and asked if he will need to go home before he admits. She will notify the administrator to see what they need to do.  1:33 pm: Received call from [place] at [facility] and they said as long as they have needed documentation today, they can admit patient tomorrow. CSW discussed with patient. He continues to refuse ALF placement. He had just gotten off the phone with his wife and said she told him to come home. CSW discussed potential capacity evaluation with MD but she stated based on their conversation he does have capacity. CSW called son to update and he expressed understanding. He is agreeable to patient going home today and can pick him up around 6:00. [facility] representative is aware as well. MD has been updated. Asked for home health orders for PT, RN, and SW. Left voicemails for [place] and Sharee Pimple to update.  Expected Discharge Plan: Assisted Living Barriers to Discharge: Continued Medical Work up  Expected Discharge Plan and Services Expected Discharge Plan: Assisted Living       Living arrangements for the past 2 months: Single Family Home                                        Social Determinants of Health (SDOH) Interventions    Readmission Risk Interventions Readmission Risk Prevention Plan 03/07/2020 02/12/2019 12/25/2018  Transportation Screening Complete - Complete  PCP or Specialist Appt within 3-5 Days Complete - Complete  HRI or Home Care Consult Complete - Complete  Social Work Consult for [place] Planning/Counseling Complete - Complete  Palliative Care Screening Not Applicable - Not Applicable  Medication Review Press photographer) Complete - Complete  PCP or Specialist appointment within 3-5 days of discharge - Not Complete -  PCP/Specialist Appt Not Complete comments - not ready for dc, family requests SNF placement -  HRI or Home Care Consult - Complete -  SW Recovery Care/Counseling Consult - Complete -  Palliative Care Screening - Not Applicable -  [place] - Complete -  Some recent data might be hidden

## 2020-04-10 NOTE — Discharge Summary (Addendum)
Physician Discharge Summary  [name] ELF:[id number] DOB: [birth year] DOA: 04/08/2020  PCP: [name], NP  Admit date: 04/08/2020 Discharge date: 04/10/2020  Admitted From: Home Disposition: Home  Recommendations for Outpatient Follow-up:  1. Follow up with PCP in 1-2 weeks 2. Please obtain BMP/CBC in one week 3. Please follow up on the following pending results: Urine culture susceptibility results  Home Health: Yes Equipment/Devices: Bilateral lower extremity prosthesis Discharge Condition: Stable CODE STATUS: Full Diet recommendation: Heart Healthy / Carb Modified   Brief/Interim Summary: [name] a 85 y.o.malewith medical history significant ofa.fib on xarelto,DM II, PVD s/p BL BKA , CAD, Rec UTI seen in ed for AMS . He was found to have UTI, urine cultures growing Proteus mirabilis, susceptibility results pending.  He was treated with ceftriaxone while in the hospital and discharged on cefdinir to complete a 7-day course. His mental status improved and he was alert and oriented x3 before discharge. Apparently his wife who also has a diagnosis of early dementia is unable to take care of him at home due to bilateral BKA and partially dependent on her for his ADLs.  Son wants him to go to ALF but patient declined.  He has the capacity to make decision.  According to patient he will go to ALF if his wife goes with him.  He was discharged home with home health services and they can follow-up with primary care provider for further needs and if they decided to go to a facility.  Patient was also found to have bradycardia with heart rate in low 50s and high 40s.  He was on amiodarone and metoprolol for A. fib.  Amiodarone was discontinued and metoprolol dose was decreased from 50 mg daily to 25 mg daily.  He was advised to follow-up with his cardiologist for further management.  He will continue with his Xarelto.  He will continue with rest of his home  medications and follow-up with his providers.  Discharge Diagnoses:  Principal Problem:   AMS (altered mental status) Active Problems:   Type 2 diabetes mellitus treated with insulin (HCC)   Essential hypertension   AKI (acute kidney injury) ([place])   Paroxysmal A-fib (HCC)   Bradycardia   Seizures (HCC)   CAD (coronary artery disease)   UTI (urinary tract infection)   Discharge Instructions  Discharge Instructions    Diet - low sodium heart healthy   Complete by: As directed    Discharge instructions   Complete by: As directed    It was pleasure taking care of you. You are being given antibiotics for 5 more days, please take it as directed. We discontinue your amiodarone and decrease the dose of metoprolol due to your lower heart rate, please follow-up with your cardiologist for further recommendations.   Increase activity slowly   Complete by: As directed      Allergies as of 04/10/2020      Reactions   Contrast Media [iodinated Diagnostic Agents] Shortness Of Breath   SOB   Iohexol Shortness Of Breath    Desc: Respiratory Distress, Laryngedema, diaphoresis, Onset Date: [id number]   Metrizamide Shortness Of Breath   SOB SOB SOB   Latex Rash      Medication List    STOP taking these medications   amiodarone 200 MG tablet Commonly known as: PACERONE     TAKE these medications   aspirin EC 81 MG tablet Take 81 mg by mouth daily. Swallow whole.   atorvastatin 80 MG tablet  Commonly known as: LIPITOR Take 80 mg by mouth at bedtime.   Basaglar KwikPen 100 UNIT/ML Inject 28 Units into the skin at bedtime.   bisacodyl 5 MG EC tablet Commonly known as: DULCOLAX Take 1 tablet (5 mg total) by mouth daily as needed for moderate constipation.   cefdinir 300 MG capsule Commonly known as: OMNICEF Take 1 capsule (300 mg total) by mouth 2 (two) times daily for 5 days.   chlorthalidone 25 MG tablet Commonly known as: HYGROTON Take 25 mg by mouth daily.   diltiazem  120 MG 24 hr capsule Commonly known as: DILACOR XR Take 120 mg by mouth daily.   divalproex 500 MG DR tablet Commonly known as: DEPAKOTE Take 500 mg by mouth daily.   Ensure Max Protein Liqd Take 330 mLs (11 oz total) by mouth 2 (two) times daily.   ferrous sulfate 325 (65 FE) MG tablet Take 325 mg by mouth daily with breakfast.   gabapentin 300 MG capsule Commonly known as: NEURONTIN Take 300 mg by mouth 3 (three) times daily.   isosorbide mononitrate 30 MG 24 hr tablet Commonly known as: IMDUR Take 60 mg by mouth daily.   Klor-Con M20 20 MEQ tablet Generic drug: potassium chloride SA Take 20 mEq by mouth 2 (two) times daily.   metFORMIN 500 MG tablet Commonly known as: GLUCOPHAGE Take 500 mg by mouth 2 (two) times daily with a meal.   metoprolol succinate 25 MG 24 hr tablet Commonly known as: TOPROL-XL Take 1 tablet (25 mg total) by mouth daily. Take with or immediately following a meal. What changed:   medication strength  how much to take   NovoLOG FlexPen 100 UNIT/ML FlexPen Generic drug: insulin aspart Inject 4 Units into the skin 3 (three) times daily with meals.   Rivaroxaban 15 MG Tabs tablet Commonly known as: XARELTO Take 1 tablet (15 mg total) by mouth daily with supper. Hold Xarelto for 3 days.  Start on January 09, 2019.   Vitamin D-3 25 MCG (1000 UT) Caps Take 1,000 Units by mouth daily at 6 (six) AM.   zinc sulfate 220 (50 Zn) MG capsule Take 220 mg by mouth daily.       Follow-up Information    [name], NP. Schedule an appointment as soon as possible for a visit in 1 week(s).   Specialty: Family Medicine Contact information: [place] [address] [phone]              Allergies  Allergen Reactions  . Contrast Media [Iodinated Diagnostic Agents] Shortness Of Breath    SOB  . Iohexol Shortness Of Breath     Desc: Respiratory Distress, Laryngedema, diaphoresis, Onset Date: [id number]   . Metrizamide  Shortness Of Breath    SOB SOB SOB   . Latex Rash    Consultations:  None  Procedures/Studies: DG Chest 2 View  Result Date: 04/08/2020 CLINICAL DATA:  Weakness EXAM: CHEST - 2 VIEW COMPARISON:  12/05/2019 FINDINGS: Low lung volumes. Bibasilar opacities. No significant pleural effusion. Cardiomegaly. No pneumothorax. IMPRESSION: Bibasilar atelectasis/consolidation.  Cardiomegaly. Electronically Signed   By: Macy Mis M.D.   On: 04/08/2020 14:20   CT Head Wo Contrast  Result Date: 04/08/2020 CLINICAL DATA:  Altered mental status. EXAM: CT HEAD WITHOUT CONTRAST TECHNIQUE: Contiguous axial images were obtained from the base of the skull through the vertex without intravenous contrast. COMPARISON:  CT head 03/06/2020 FINDINGS: Brain: Generalized atrophy. Patchy white matter hypodensity bilaterally unchanged. Negative for  acute infarct, hemorrhage, mass. Vascular: Negative for hyperdense vessel Skull: Negative Sinuses/Orbits: Bilateral cataract extraction. Mild mucosal edema left maxillary sinus. Other: None IMPRESSION: No acute abnormality. Atrophy and chronic white matter changes stable from the prior CT. Electronically Signed   By: Franchot Gallo M.D.   On: 04/08/2020 14:48   MR BRAIN WO CONTRAST  Result Date: 04/08/2020 CLINICAL DATA:  Delirium. EXAM: MRI HEAD WITHOUT CONTRAST TECHNIQUE: Multiplanar, multiecho pulse sequences of the brain and surrounding structures were obtained without intravenous contrast. COMPARISON:  Noncontrast head CT performed earlier the same day 04/08/2020. Brain MRI/MRA 06/05/2018. FINDINGS: Brain: Moderate cerebral atrophy. Moderate multifocal T2/FLAIR hyperintensity within the cerebral white matter is nonspecific, but compatible with chronic small vessel ischemic disease. There are a few nonspecific chronic supratentorial microhemorrhages. 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 There is no acute infarct. No evidence of  intracranial mass. No extra-axial fluid collection. No midline shift. Vascular: Expected proximal arterial flow voids. Skull and upper cervical spine: No focal marrow lesion. Mild ligamentous hypertrophy/pannus formation posterior to the dens. Sinuses/Orbits: Visualized orbits show no acute finding. Mild left maxillary sinus mucosal thickening. IMPRESSION: No evidence of acute intracranial abnormality, including acute infarction. Moderate cerebral atrophy and chronic small vessel ischemic disease, stable as compared to the brain MRI of 06/05/2018. 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 Electronically Signed   By: Kellie Simmering DO   On: 04/08/2020 18:59     Subjective: Patient has no complaints when seen during morning rounds.  He wants to go home.  Declining to go to any facility without his wife.  He was alert and oriented x3 and able to answer all the questions appropriately.  Discharge Exam: Vitals:   04/10/20 0836 04/10/20 1158  BP: (!) 170/83 (!) 147/67  Pulse: 64 60  Resp: 17   Temp: 98.4 F (36.9 C) 98.4 F (36.9 C)  SpO2: 95% 95%   Vitals:   04/09/20 1932 04/10/20 0407 04/10/20 0836 04/10/20 1158  BP: (!) 137/51 (!) 123/58 (!) 170/83 (!) 147/67  Pulse: 65 61 64 60  Resp: 16 14 17    Temp: 98 F (36.7 C) 97.8 F (36.6 C) 98.4 F (36.9 C) 98.4 F (36.9 C)  TempSrc: Axillary Axillary Oral Axillary  SpO2: 95% 94% 95% 95%  Weight:      Height:        General: Pt is alert, awake, not in acute distress Cardiovascular: RRR, S1/S2 +, no rubs, no gallops Respiratory: CTA bilaterally, no wheezing, no rhonchi Abdominal: Soft, NT, ND, bowel sounds + Extremities: Bilateral BKA   The results of significant diagnostics from this hospitalization (including imaging, microbiology, ancillary and laboratory) are listed below for reference.    Microbiology: Recent Results (from the past 240 hour(s))  Urine culture     Status: Abnormal (Preliminary  result)   Collection Time: 04/08/20  1:30 PM   Specimen: Urine, Random  Result Value Ref Range Status   Specimen Description   Final    URINE, RANDOM Performed at [facility], [address], [place], [place] [address]    Special Requests   Final    NONE Performed at [facility], [address] [place], [place] [address]    Culture (A)  Final    >=100,000 COLONIES/mL PROTEUS MIRABILIS SUSCEPTIBILITIES TO FOLLOW Performed at [facility], [place] [address] [place], [place] [address]    Report Status PENDING  Incomplete  Respiratory Panel by RT PCR (Flu  A&B, Covid) - Nasopharyngeal Swab     Status: None   Collection Time: 04/08/20  1:30 PM   Specimen: Nasopharyngeal Swab  Result Value Ref Range Status   SARS Coronavirus 2 by RT PCR NEGATIVE NEGATIVE Final    Comment: (NOTE) SARS-CoV-2 target nucleic acids are NOT DETECTED.  The SARS-CoV-2 RNA is generally detectable in upper respiratoy specimens during the acute phase of infection. The lowest concentration of SARS-CoV-2 viral copies this assay can detect is 131 copies/mL. A negative result does not preclude SARS-Cov-2 infection and should not be used as the sole basis for treatment or other patient management decisions. A negative result may occur with  improper specimen collection/handling, submission of specimen other than nasopharyngeal swab, presence of viral mutation(s) within the areas targeted by this assay, and inadequate number of viral copies (<131 copies/mL). A negative result must be combined with clinical observations, patient history, and epidemiological information. The expected result is Negative.  Fact Sheet for Patients:  PinkCheek.be  Fact Sheet for Healthcare Providers:  GravelBags.it  This test is no t yet approved or cleared by the [place] FDA and  has been authorized for detection and/or diagnosis of SARS-CoV-2  by FDA under an Emergency Use Authorization (EUA). This EUA will remain  in effect (meaning this test can be used) for the duration of the COVID-19 declaration under Section 564(b)(1) of the Act, 21 U.S.C. section 360bbb-3(b)(1), unless the authorization is terminated or revoked sooner.     Influenza A by PCR NEGATIVE NEGATIVE Final   Influenza B by PCR NEGATIVE NEGATIVE Final    Comment: (NOTE) The Xpert Xpress SARS-CoV-2/FLU/RSV assay is intended as an aid in  the diagnosis of influenza from Nasopharyngeal swab specimens and  should not be used as a sole basis for treatment. Nasal washings and  aspirates are unacceptable for Xpert Xpress SARS-CoV-2/FLU/RSV  testing.  Fact Sheet for Patients: PinkCheek.be  Fact Sheet for Healthcare Providers: GravelBags.it  This test is not yet approved or cleared by the [place] FDA and  has been authorized for detection and/or diagnosis of SARS-CoV-2 by  FDA under an Emergency Use Authorization (EUA). This EUA will remain  in effect (meaning this test can be used) for the duration of the  Covid-19 declaration under Section 564(b)(1) of the Act, 21  U.S.C. section 360bbb-3(b)(1), unless the authorization is  terminated or revoked. Performed at [facility], [address] [place], [place] [address]      Labs: BNP (last 3 results) Recent Labs    06/24/19 1346 09/16/19 0649 11/30/19 0109  BNP 887.0* 734.0* 948.5*   Basic Metabolic Panel: Recent Labs  Lab 04/08/20 1330 04/09/20 0951 04/10/20 0544  NA 141 138 138  K 5.2* 4.6 4.0  CL 105 100 104  CO2 23 26 25   GLUCOSE 69* 124* 169*  BUN 44* 46* 45*  CREATININE 1.56* 1.57* 1.46*  CALCIUM 8.5* 8.8* 8.3*   Liver Function Tests: Recent Labs  Lab 04/08/20 1330  AST 60*  ALT 25  ALKPHOS 104  BILITOT 1.1  PROT 7.2  ALBUMIN 3.4*   No results for input(s): LIPASE, AMYLASE in the last 168 hours. No  results for input(s): AMMONIA in the last 168 hours. CBC: Recent Labs  Lab 04/08/20 1330 04/09/20 0951  WBC 11.3* 9.1  NEUTROABS 8.1*  --   HGB 13.3 12.8*  HCT 40.7 38.0*  MCV 94.9 90.5  PLT 254 258   Cardiac Enzymes: No results for input(s): CKTOTAL, CKMB, CKMBINDEX,  TROPONINI in the last 168 hours. BNP: Invalid input(s): POCBNP CBG: Recent Labs  Lab 04/09/20 0635 04/09/20 1611 04/09/20 2124 04/10/20 0853 04/10/20 1444  GLUCAP 99 181* 193* 204* 256*   D-Dimer No results for input(s): DDIMER in the last 72 hours. Hgb A1c No results for input(s): HGBA1C in the last 72 hours. Lipid Profile Recent Labs    04/08/20 2053  CHOL 154  HDL 39*  LDLCALC 101*  TRIG 72  CHOLHDL 3.9   Thyroid function studies No results for input(s): TSH, T4TOTAL, T3FREE, THYROIDAB in the last 72 hours.  Invalid input(s): FREET3 Anemia work up No results for input(s): VITAMINB12, FOLATE, FERRITIN, TIBC, IRON, RETICCTPCT in the last 72 hours. Urinalysis    Component Value Date/Time   COLORURINE YELLOW (A) 04/08/2020 1333   APPEARANCEUR HAZY (A) 04/08/2020 1333   LABSPEC 1.015 04/08/2020 1333   PHURINE 7.0 04/08/2020 1333   GLUCOSEU NEGATIVE 04/08/2020 1333   HGBUR LARGE (A) 04/08/2020 1333   [place] 04/08/2020 [place] 04/08/2020 1333   PROTEINUR 30 (A) 04/08/2020 1333   NITRITE NEGATIVE 04/08/2020 1333   LEUKOCYTESUR MODERATE (A) 04/08/2020 1333   Sepsis Labs Invalid input(s): PROCALCITONIN,  WBC,  LACTICIDVEN Microbiology Recent Results (from the past 240 hour(s))  Urine culture     Status: Abnormal (Preliminary result)   Collection Time: 04/08/20  1:30 PM   Specimen: Urine, Random  Result Value Ref Range Status   Specimen Description   Final    URINE, RANDOM Performed at [facility], [address], [place], [place] [address]    Special Requests   Final    NONE Performed at [facility], [address],  [place], [place] [address]    Culture (A)  Final    >=100,000 COLONIES/mL PROTEUS MIRABILIS SUSCEPTIBILITIES TO FOLLOW Performed at [facility], [place] [address], [place], [place] [address]    Report Status PENDING  Incomplete  Respiratory Panel by RT PCR (Flu A&B, Covid) - Nasopharyngeal Swab     Status: None   Collection Time: 04/08/20  1:30 PM   Specimen: Nasopharyngeal Swab  Result Value Ref Range Status   SARS Coronavirus 2 by RT PCR NEGATIVE NEGATIVE Final    Comment: (NOTE) SARS-CoV-2 target nucleic acids are NOT DETECTED.  The SARS-CoV-2 RNA is generally detectable in upper respiratoy specimens during the acute phase of infection. The lowest concentration of SARS-CoV-2 viral copies this assay can detect is 131 copies/mL. A negative result does not preclude SARS-Cov-2 infection and should not be used as the sole basis for treatment or other patient management decisions. A negative result may occur with  improper specimen collection/handling, submission of specimen other than nasopharyngeal swab, presence of viral mutation(s) within the areas targeted by this assay, and inadequate number of viral copies (<131 copies/mL). A negative result must be combined with clinical observations, patient history, and epidemiological information. The expected result is Negative.  Fact Sheet for Patients:  PinkCheek.be  Fact Sheet for Healthcare Providers:  GravelBags.it  This test is no t yet approved or cleared by the [place] FDA and  has been authorized for detection and/or diagnosis of SARS-CoV-2 by FDA under an Emergency Use Authorization (EUA). This EUA will remain  in effect (meaning this test can be used) for the duration of the COVID-19 declaration under Section 564(b)(1) of the Act, 21 U.S.C. section 360bbb-3(b)(1), unless the authorization is terminated or revoked sooner.     Influenza A by PCR  NEGATIVE  NEGATIVE Final   Influenza B by PCR NEGATIVE NEGATIVE Final    Comment: (NOTE) The Xpert Xpress SARS-CoV-2/FLU/RSV assay is intended as an aid in  the diagnosis of influenza from Nasopharyngeal swab specimens and  should not be used as a sole basis for treatment. Nasal washings and  aspirates are unacceptable for Xpert Xpress SARS-CoV-2/FLU/RSV  testing.  Fact Sheet for Patients: PinkCheek.be  Fact Sheet for Healthcare Providers: GravelBags.it  This test is not yet approved or cleared by the [place] FDA and  has been authorized for detection and/or diagnosis of SARS-CoV-2 by  FDA under an Emergency Use Authorization (EUA). This EUA will remain  in effect (meaning this test can be used) for the duration of the  Covid-19 declaration under Section 564(b)(1) of the Act, 21  U.S.C. section 360bbb-3(b)(1), unless the authorization is  terminated or revoked. Performed at [facility], [address] [place], [place] [address]     Time coordinating discharge: Over 30 minutes  SIGNED:  Lorella Nimrod, MD  [facility] 04/10/2020, 4:00 PM  If 7PM-7AM, please contact night-coverage [email or web address]  This record has been created using Systems analyst. Errors have been sought and corrected,but may not always be located. Such creation errors do not reflect on the standard of care.

## 2020-04-10 NOTE — Progress Notes (Signed)
Mobility Specialist - Progress Note   04/10/20 1254  Mobility  Activity Dangled on edge of bed (seated exercises, attempted S2S x 1)  Level of Assistance Modified independent, requires aide device or extra time (Mod. assist S2S)  Assistive Device Front wheel walker  Mobility Response Tolerated well  Mobility performed by Mobility specialist  $Mobility charge 1 Mobility    Pt laying in bed upon arrival. Pt agreed to session. Pt HOH. Pt slightly confused, but able to follow simple commands. Pt able to get to EOB mod. Independently. Pt able to don/doff bilat prosthesis mod. Independently. Pt dangled EOB for a couple of minutes. Pt performed seated exercises: kicks x 10, marches x 10. Attempted to S2S x 1, pt able to fully S2S to RW w/ mod. Assist. Noted soiled linens and pt having BM. Further mobility limited d/t pt having BM. NT and nurse was notified. Pt left in bed w/ all needs placed in reach.     Yona Kosek Mobility Specialist  04/10/20, 1:06 PM

## 2020-04-10 NOTE — Progress Notes (Signed)
[name]  A and O x 4 VSS. Pt tolerating diet well. No complaints of pain or nausea. IV removed intact, prescriptions given. Pt voices understanding of discharge instructions with no further questions. Pt discharged home via wheelchair with son.   Allergies as of 04/10/2020      Reactions   Contrast Media [iodinated Diagnostic Agents] Shortness Of Breath   SOB   Iohexol Shortness Of Breath    Desc: Respiratory Distress, Laryngedema, diaphoresis, Onset Date: [id number]   Metrizamide Shortness Of Breath   SOB SOB SOB   Latex Rash      Medication List    STOP taking these medications   amiodarone 200 MG tablet Commonly known as: PACERONE     TAKE these medications   aspirin EC 81 MG tablet Take 81 mg by mouth daily. Swallow whole.   atorvastatin 80 MG tablet Commonly known as: LIPITOR Take 80 mg by mouth at bedtime.   Basaglar KwikPen 100 UNIT/ML Inject 28 Units into the skin at bedtime.   bisacodyl 5 MG EC tablet Commonly known as: DULCOLAX Take 1 tablet (5 mg total) by mouth daily as needed for moderate constipation.   cefdinir 300 MG capsule Commonly known as: OMNICEF Take 1 capsule (300 mg total) by mouth 2 (two) times daily for 5 days.   chlorthalidone 25 MG tablet Commonly known as: HYGROTON Take 25 mg by mouth daily.   diltiazem 120 MG 24 hr capsule Commonly known as: DILACOR XR Take 120 mg by mouth daily.   divalproex 500 MG DR tablet Commonly known as: DEPAKOTE Take 500 mg by mouth daily.   Ensure Max Protein Liqd Take 330 mLs (11 oz total) by mouth 2 (two) times daily.   ferrous sulfate 325 (65 FE) MG tablet Take 325 mg by mouth daily with breakfast.   gabapentin 300 MG capsule Commonly known as: NEURONTIN Take 300 mg by mouth 3 (three) times daily.   isosorbide mononitrate 30 MG 24 hr tablet Commonly known as: IMDUR Take 60 mg by mouth daily.   Klor-Con M20 20 MEQ tablet Generic drug: potassium chloride SA Take 20 mEq by mouth 2  (two) times daily.   metFORMIN 500 MG tablet Commonly known as: GLUCOPHAGE Take 500 mg by mouth 2 (two) times daily with a meal.   metoprolol succinate 25 MG 24 hr tablet Commonly known as: TOPROL-XL Take 1 tablet (25 mg total) by mouth daily. Take with or immediately following a meal. What changed:   medication strength  how much to take   NovoLOG FlexPen 100 UNIT/ML FlexPen Generic drug: insulin aspart Inject 4 Units into the skin 3 (three) times daily with meals.   Rivaroxaban 15 MG Tabs tablet Commonly known as: XARELTO Take 1 tablet (15 mg total) by mouth daily with supper. Hold Xarelto for 3 days.  Start on January 09, 2019.   Vitamin D-3 25 MCG (1000 UT) Caps Take 1,000 Units by mouth daily at 6 (six) AM.   zinc sulfate 220 (50 Zn) MG capsule Take 220 mg by mouth daily.       Vitals:   04/10/20 0836 04/10/20 1158  BP: (!) 170/83 (!) 147/67  Pulse: 64 60  Resp: 17   Temp: 98.4 F (36.9 C) 98.4 F (36.9 C)  SpO2: 95% 95%    [name]

## 2020-04-10 NOTE — TOC Transition Note (Signed)
Transition of Care [facility]) - CM/SW Discharge Note   Patient Details  Name: Teagan Heidrick MRN: [id number] Date of Birth: April 13, 1935  Transition of Care [facility]) CM/SW Contact:  Candie Chroman, LCSW Phone Number: 04/10/2020, 4:11 PM   Clinical Narrative:  Patient has orders to discharge home today. [facility] is aware. They do not have an aide available so asked MD to take this out of the home health order. CSW tried calling patient in room but no answer. Asked RN to update him. No further concerns. CSW signing off.    Final next level of care: [place] Barriers to Discharge: Barriers Resolved   Patient Goals and CMS Choice Patient states their goals for this hospitalization and ongoing recovery are:: Patient not fully oriented.   Choice offered to / list presented to : NA  Discharge Placement                Patient to be transferred to facility by: Son will take him home Name of family member notified: Wissam Resor Patient and family notified of of transfer: 04/10/20  Discharge Plan and Services                          HH Arranged: RN, PT, OT, Social Work [place] Date [facility] Agency Contacted: 04/10/20   Representative spoke with at [place]: [place] Determinants of Health (SDOH) Interventions     Readmission Risk Interventions Readmission Risk Prevention Plan 03/07/2020 02/12/2019 12/25/2018  Transportation Screening Complete - Complete  PCP or Specialist Appt within 3-5 Days Complete - Complete  HRI or Home Care Consult Complete - Complete  Social Work Consult for [place] Planning/Counseling Complete - Complete  Palliative Care Screening Not Applicable - Not Applicable  Medication Review Press photographer) Complete - Complete  PCP or Specialist appointment within 3-5 days of discharge - Not Complete -  PCP/Specialist Appt Not Complete comments - not ready for dc, family requests SNF placement  -  HRI or Home Care Consult - Complete -  SW Recovery Care/Counseling Consult - Complete -  Palliative Care Screening - Not Applicable -  [place] - Complete -  Some recent data might be hidden

## 2020-04-11 LAB — URINE CULTURE: Culture: >=100000 — AB

## 2020-04-18 ENCOUNTER — Other Ambulatory Visit: Payer: Self-pay

## 2020-04-18 ENCOUNTER — Emergency Department
Admission: EM | Admit: 2020-04-18 | Discharge: 2020-04-18 | Disposition: A | Discharge status: Home / Self Care | Payer: Medicare Other | Attending: Emergency Medicine | Admitting: Emergency Medicine

## 2020-04-18 ENCOUNTER — Encounter: Payer: Self-pay | Admitting: Emergency Medicine

## 2020-04-18 DIAGNOSIS — I5032 Chronic diastolic (congestive) heart failure: Secondary | ICD-10-CM | POA: Diagnosis not present

## 2020-04-18 DIAGNOSIS — Z794 Long term (current) use of insulin: Secondary | ICD-10-CM | POA: Insufficient documentation

## 2020-04-18 DIAGNOSIS — I2510 Atherosclerotic heart disease of native coronary artery without angina pectoris: Secondary | ICD-10-CM | POA: Insufficient documentation

## 2020-04-18 DIAGNOSIS — Z7982 Long term (current) use of aspirin: Secondary | ICD-10-CM | POA: Insufficient documentation

## 2020-04-18 DIAGNOSIS — Z8546 Personal history of malignant neoplasm of prostate: Secondary | ICD-10-CM | POA: Diagnosis not present

## 2020-04-18 DIAGNOSIS — I4891 Unspecified atrial fibrillation: Secondary | ICD-10-CM | POA: Diagnosis not present

## 2020-04-18 DIAGNOSIS — Z79899 Other long term (current) drug therapy: Secondary | ICD-10-CM | POA: Diagnosis not present

## 2020-04-18 DIAGNOSIS — Z7984 Long term (current) use of oral hypoglycemic drugs: Secondary | ICD-10-CM | POA: Insufficient documentation

## 2020-04-18 DIAGNOSIS — Z87891 Personal history of nicotine dependence: Secondary | ICD-10-CM | POA: Insufficient documentation

## 2020-04-18 DIAGNOSIS — R310 Gross hematuria: Secondary | ICD-10-CM | POA: Diagnosis not present

## 2020-04-18 DIAGNOSIS — Z7901 Long term (current) use of anticoagulants: Secondary | ICD-10-CM | POA: Insufficient documentation

## 2020-04-18 DIAGNOSIS — R319 Hematuria, unspecified: Secondary | ICD-10-CM | POA: Diagnosis present

## 2020-04-18 DIAGNOSIS — N189 Chronic kidney disease, unspecified: Secondary | ICD-10-CM | POA: Diagnosis not present

## 2020-04-18 DIAGNOSIS — Z96641 Presence of right artificial hip joint: Secondary | ICD-10-CM | POA: Insufficient documentation

## 2020-04-18 DIAGNOSIS — Z91040 Latex allergy status: Secondary | ICD-10-CM | POA: Insufficient documentation

## 2020-04-18 DIAGNOSIS — E1122 Type 2 diabetes mellitus with diabetic chronic kidney disease: Secondary | ICD-10-CM | POA: Diagnosis not present

## 2020-04-18 DIAGNOSIS — Z8616 Personal history of COVID-19: Secondary | ICD-10-CM | POA: Insufficient documentation

## 2020-04-18 DIAGNOSIS — I130 Hypertensive heart and chronic kidney disease with heart failure and stage 1 through stage 4 chronic kidney disease, or unspecified chronic kidney disease: Secondary | ICD-10-CM | POA: Insufficient documentation

## 2020-04-18 LAB — CBC
HCT: 36.9 % — ABNORMAL LOW (ref 39.0–52.0)
Hemoglobin: 12.1 g/dL — ABNORMAL LOW (ref 13.0–17.0)
MCH: 30.3 pg (ref 26.0–34.0)
MCHC: 32.8 g/dL (ref 30.0–36.0)
MCV: 92.5 fL (ref 80.0–100.0)
Platelets: 244 10*3/uL (ref 150–400)
RBC: 3.99 MIL/uL — ABNORMAL LOW (ref 4.22–5.81)
RDW: 14.1 % (ref 11.5–15.5)
WBC: 9.2 10*3/uL (ref 4.0–10.5)
nRBC: 0 % (ref 0.0–0.2)

## 2020-04-18 LAB — URINALYSIS, COMPLETE (UACMP) WITH MICROSCOPIC
Bacteria, UA: NONE SEEN
RBC / HPF: >50 RBC/hpf — ABNORMAL HIGH (ref 0–5)
Specific Gravity, Urine: 1.017 (ref 1.005–1.030)
Squamous Epithelial / HPF: NONE SEEN (ref 0–5)

## 2020-04-18 LAB — BASIC METABOLIC PANEL
Anion gap: 9 (ref 5–15)
BUN: 42 mg/dL — ABNORMAL HIGH (ref 8–23)
CO2: 26 mmol/L (ref 22–32)
Calcium: 8.9 mg/dL (ref 8.9–10.3)
Chloride: 105 mmol/L (ref 98–111)
Creatinine, Ser: 1.49 mg/dL — ABNORMAL HIGH (ref 0.61–1.24)
GFR, Estimated: 46 mL/min — ABNORMAL LOW (ref >60)
Glucose, Bld: 172 mg/dL — ABNORMAL HIGH (ref 70–99)
Potassium: 4.5 mmol/L (ref 3.5–5.1)
Sodium: 140 mmol/L (ref 135–145)

## 2020-04-18 NOTE — ED Provider Notes (Signed)
[facility] Emergency Department Provider Note ____________________________________________   First MD Initiated Contact with Patient 04/18/20 1036     (approximate)  I have reviewed the triage vital signs and the nursing notes.  HISTORY  Chief Complaint Hematuria   HPI [name] is a 85 y.o. malewho presents to the ED for evaluation of hematuria.  Chart review indicates history of A. fib on Xarelto.  DM on insulin.  HTN, HLD. PVD s/p bilateral BKA's.  Recurrent UTIs. Patient was recently admitted to our facility from 11/16-11/18 for UTI with cultures growing Proteus.  Discharged on cefdinir, and culture reviewed by me indicates susceptibility to cephalosporins. Patient was discharged home despite recommendations for SNF placement.  Patient reports onset of painless hematuria first noticed this morning.  Denies passage of clots, abdominal pain, dysuria, diarrhea, emesis or fever.  Reports he feels fine and has no complaints.  Past Medical History:  Diagnosis Date  . Arthritis   . Atrial fibrillation ([place])   . Carcinoma of prostate ([place])   . CHF (congestive heart failure) ([place])   . Chronic kidney disease   . Diabetes mellitus without complication ([place])   . ED (erectile dysfunction)   . Frequent urination   . Hyperlipidemia   . Hypertension   . Moderate mitral insufficiency   . Peripheral vascular disease ([place])   . Pneumonia 04/2016  . Prostate cancer ([place])   . Sleep apnea    OSA--USE C-PAP  . Ulcer of left foot due to type 2 diabetes mellitus ([place])   . Urinary stress incontinence, male     Patient Active Problem List   Diagnosis Date Noted  . AMS (altered mental status) 04/08/2020  . CAD (coronary artery disease) 03/06/2020  . Personal history of COVID-19 03/06/2020  . Acute metabolic encephalopathy 03/50/0938  . UTI (urinary tract infection) 03/06/2020  . (HFpEF) heart failure with preserved ejection fraction ([place]) 03/06/2020   . Goals of care, counseling/discussion   . Palliative care by specialist   . Seizures ([place]) 11/30/2019  . ACS (acute coronary syndrome) ([place]) 06/24/2019  . Bradycardia 05/16/2019  . Paroxysmal A-fib ([place]) 03/27/2019  . Hx of BKA, right ([place]) 03/02/2019  . Severe protein-calorie malnutrition ([place]) 02/16/2019  . Acute systolic CHF (congestive heart failure) ([place]) 02/16/2019  . Acute respiratory failure with hypoxia ([place]) 02/08/2019  . Pneumonia due to COVID-19 virus 02/08/2019  . Below-knee amputation of right lower extremity ([place]) 01/15/2019  . Polyp of descending colon   . Iron deficiency anemia due to chronic blood loss   . Sacral decubitus ulcer, stage II ([place]) 12/26/2018  . Acute respiratory failure ([place]) 12/20/2018  . Femur fracture ([place]) 10/13/2018  . Chest pain 06/03/2018  . NSTEMI (non-ST elevated myocardial infarction) ([place]) 01/16/2018  . Acute CHF (congestive heart failure) ([place]) 11/23/2017  . Atrial fibrillation with rapid ventricular response ([place]) 10/15/2017  . BKA stump complication ([place]) 18/29/9371  . 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 ([place]) 02/10/2017  . S/P BKA (below knee amputation) unilateral, left ([place]) 02/01/2017  . Chronic diastolic heart failure ([place]) 01/21/2017  . Pressure injury of skin 01/18/2017  . Atherosclerosis of artery of extremity with gangrene ([place]) 01/05/2017  . Diabetic foot ulcer ([place]) 12/29/2016  . Ataxia 10/21/2016  . Atherosclerosis of native arteries of the extremities with ulceration ([place]) 10/12/2016  . History of femoral angiogram 09/06/2016  . Left leg pain 09/06/2016  . Leukocytosis 09/06/2016  . Generalized weakness 09/06/2016  . AKI (acute kidney injury) (  [place]) 09/06/2016  . Atrial fibrillation, chronic ([place]) 08/30/2016  . Pneumonia 05/19/2016  . Hyperlipidemia 04/20/2016  . Back pain 04/19/2016  . Type 2 diabetes mellitus treated with insulin ([place]) 04/19/2016  . Essential hypertension 04/19/2016  . PAD  (peripheral artery disease) ([place]) 04/19/2016  . Erectile dysfunction following radical prostatectomy 06/25/2015  . History of prostate cancer 12/23/2014  . Incontinence 12/23/2014    Past Surgical History:  Procedure Laterality Date  . AMPUTATION Left 01/12/2017   Procedure: AMPUTATION BELOW KNEE;  Surgeon: Algernon Huxley, MD;  Location: [facility] ORS;  Service: General;  Laterality: Left;  . AMPUTATION Right 12/27/2018   Procedure: AMPUTATION BELOW KNEE;  Surgeon: Algernon Huxley, MD;  Location: [facility] ORS;  Service: General;  Laterality: Right;  . APLIGRAFT PLACEMENT Left 10/15/2016   Procedure: APLIGRAFT PLACEMENT;  Surgeon: Albertine Patricia, DPM;  Location: [facility] ORS;  Service: Podiatry;  Laterality: Left;  necrotic ulcer  . CHOLECYSTECTOMY    . COLONOSCOPY WITH PROPOFOL N/A 01/02/2019   Procedure: COLONOSCOPY WITH PROPOFOL;  Surgeon: Lucilla Lame, MD;  Location: [facility] ENDOSCOPY;  Service: Endoscopy;  Laterality: N/A;  . COLONOSCOPY WITH PROPOFOL N/A 01/05/2019   Procedure: COLONOSCOPY WITH PROPOFOL;  Surgeon: Lucilla Lame, MD;  Location: [facility] ENDOSCOPY;  Service: Endoscopy;  Laterality: N/A;  . ESOPHAGOGASTRODUODENOSCOPY (EGD) WITH PROPOFOL N/A 01/02/2019   Procedure: ESOPHAGOGASTRODUODENOSCOPY (EGD) WITH PROPOFOL;  Surgeon: Lucilla Lame, MD;  Location: [facility] ENDOSCOPY;  Service: Endoscopy;  Laterality: N/A;  . EYE SURGERY Bilateral    Cataract Extraction with IOL  . HIP ARTHROPLASTY Right 10/13/2018   Procedure: ARTHROPLASTY BIPOLAR HIP (HEMIARTHROPLASTY);  Surgeon: Earnestine Leys, MD;  Location: [facility] ORS;  Service: Orthopedics;  Laterality: Right;  . IRRIGATION AND DEBRIDEMENT FOOT Left 10/15/2016   Procedure: IRRIGATION AND DEBRIDEMENT FOOT-EXCISIONAL DEBRIDEMENT OF SKIN 3RD, 4TH AND 5TH TOES WITH APPLICATION OF APLIGRAFT;  Surgeon: Albertine Patricia, DPM;  Location: [facility] ORS;  Service: Podiatry;  Laterality: Left;  necrotic,gangrene  . IRRIGATION AND DEBRIDEMENT FOOT Left 12/09/2016   Procedure:  IRRIGATION AND DEBRIDEMENT FOOT-MUSCLE/FASCIA, RESECTION OF FOURTH AND FIFTH METATARSAL NECROTIC BONE;  Surgeon: Albertine Patricia, DPM;  Location: [facility] ORS;  Service: Podiatry;  Laterality: Left;  . IRRIGATION AND DEBRIDEMENT FOOT Right 12/23/2018   Procedure: IRRIGATION AND DEBRIDEMENT FOOT and wound vac application;  Surgeon: Samara Deist, DPM;  Location: [facility] ORS;  Service: Podiatry;  Laterality: Right;  . LEFT HEART CATH N/A 06/25/2019   Procedure: Left Heart Cath and Coronary Angiography;  Surgeon: Isaias Cowman, MD;  Location: [place] CV LAB;  Service: Cardiovascular;  Laterality: N/A;  . LEFT HEART CATH AND CORONARY ANGIOGRAPHY N/A 06/05/2018   Procedure: LEFT HEART CATH AND CORONARY ANGIOGRAPHY;  Surgeon: Yolonda Kida, MD;  Location: [place] CV LAB;  Service: Cardiovascular;  Laterality: N/A;  . LOWER EXTREMITY ANGIOGRAPHY Left 08/16/2016   Procedure: Lower Extremity Angiography;  Surgeon: Algernon Huxley, MD;  Location: [place] CV LAB;  Service: Cardiovascular;  Laterality: Left;  . LOWER EXTREMITY ANGIOGRAPHY Left 09/01/2016   Procedure: Lower Extremity Angiography;  Surgeon: Algernon Huxley, MD;  Location: [place] CV LAB;  Service: Cardiovascular;  Laterality: Left;  . LOWER EXTREMITY ANGIOGRAPHY Left 10/14/2016   Procedure: Lower Extremity Angiography;  Surgeon: Algernon Huxley, MD;  Location: [place] CV LAB;  Service: Cardiovascular;  Laterality: Left;  . LOWER EXTREMITY ANGIOGRAPHY Left 12/20/2016   Procedure: Lower Extremity Angiography;  Surgeon: Algernon Huxley, MD;  Location: [place] CV LAB;  Service: Cardiovascular;  Laterality:  Left;  . LOWER EXTREMITY ANGIOGRAPHY Right 12/18/2018   Procedure: LOWER EXTREMITY ANGIOGRAPHY;  Surgeon: Algernon Huxley, MD;  Location: [place] CV LAB;  Service: Cardiovascular;  Laterality: Right;  . LOWER EXTREMITY INTERVENTION  09/01/2016   Procedure: Lower Extremity Intervention;  Surgeon: Algernon Huxley, MD;  Location:  [place] CV LAB;  Service: Cardiovascular;;  . PROSTATE SURGERY     removal  . TONSILLECTOMY     as a child    Prior to Admission medications   Medication Sig Start Date End Date Taking? Authorizing Provider  aspirin EC 81 MG tablet Take 81 mg by mouth daily. Swallow whole.    [provider]  atorvastatin (LIPITOR) 80 MG tablet Take 80 mg by mouth at bedtime.    [provider]  bisacodyl (DULCOLAX) 5 MG EC tablet Take 1 tablet (5 mg total) by mouth daily as needed for moderate constipation. 04/10/20   Lorella Nimrod, MD  chlorthalidone (HYGROTON) 25 MG tablet Take 25 mg by mouth daily.    [provider]  Cholecalciferol (VITAMIN D-3) 25 MCG (1000 UT) CAPS Take 1,000 Units by mouth daily at 6 (six) AM.  Patient not taking: Reported on 03/06/2020    [provider]  diltiazem (DILACOR XR) 120 MG 24 hr capsule Take 120 mg by mouth daily.    [provider]  divalproex (DEPAKOTE) 500 MG DR tablet Take 500 mg by mouth daily.  02/18/20   [provider]  Ensure Max Protein (ENSURE MAX PROTEIN) LIQD Take 330 mLs (11 oz total) by mouth 2 (two) times daily. 04/10/20   Lorella Nimrod, MD  ferrous sulfate 325 (65 FE) MG tablet Take 325 mg by mouth daily with breakfast.    [provider]  gabapentin (NEURONTIN) 300 MG capsule Take 300 mg by mouth 3 (three) times daily.  02/07/19   [provider]  Insulin Glargine (BASAGLAR KWIKPEN) 100 UNIT/ML Inject 28 Units into the skin at bedtime. 04/08/20   [provider]  isosorbide mononitrate (IMDUR) 30 MG 24 hr tablet Take 60 mg by mouth daily. 11/07/19   [provider]  KLOR-CON M20 20 MEQ tablet Take 20 mEq by mouth 2 (two) times daily. 02/18/20   [provider]  metFORMIN (GLUCOPHAGE) 500 MG tablet Take 500 mg by mouth 2 (two) times daily with a meal.    [provider]  metoprolol succinate (TOPROL-XL) 25 MG 24 hr tablet Take 1 tablet (25 mg  total) by mouth daily. Take with or immediately following a meal. 04/10/20   Lorella Nimrod, MD  NOVOLOG FLEXPEN 100 UNIT/ML FlexPen Inject 4 Units into the skin 3 (three) times daily with meals. 04/08/20   [provider]  Rivaroxaban (XARELTO) 15 MG TABS tablet Take 1 tablet (15 mg total) by mouth daily with supper. Hold Xarelto for 3 days.  Start on January 09, 2019. 01/09/19   [name], MD  zinc sulfate 220 (50 Zn) MG capsule Take 220 mg by mouth daily. Patient not taking: Reported on 03/06/2020    [provider]    Allergies Contrast media [iodinated diagnostic agents], Iohexol, Metrizamide, and Latex  Family History  Problem Relation Age of Onset  . Hypertension Mother   . Diabetes Mother   . Prostate cancer Neg Hx   . Bladder Cancer Neg Hx   . Kidney disease Neg Hx     Social History Social History   Tobacco Use  . Smoking status: Former Smoker  Packs/day: 0.25    Types: Cigarettes    Quit date: 10/14/1994    Years since quitting: 25.5  . Smokeless tobacco: Never Used  Vaping Use  . Vaping Use: Never used  Substance Use Topics  . Alcohol use: No    Alcohol/week: 0.0 standard drinks  . Drug use: No    Review of Systems  Constitutional: No fever/chills Eyes: No visual changes. ENT: No sore throat. Cardiovascular: Denies chest pain. Respiratory: Denies shortness of breath. Gastrointestinal: No abdominal pain.  No nausea, no vomiting.  No diarrhea.  No constipation. Genitourinary: Negative for dysuria.  Positive hematuria. Musculoskeletal: Negative for back pain. Skin: Negative for rash. Neurological: Negative for headaches, focal weakness or numbness.  ____________________________________________   PHYSICAL EXAM:  VITAL SIGNS: Vitals:   04/18/20 1210 04/18/20 1212  BP: (!) 186/72   Pulse:  (!) 56  Resp:    Temp:    SpO2:  95%      Constitutional: Alert and oriented. Well appearing and in no acute distress. Eyes: Conjunctivae  are normal. PERRL. EOMI. Head: Atraumatic. Nose: No congestion/rhinnorhea. Mouth/Throat: Mucous membranes are moist.  Oropharynx non-erythematous. Neck: No stridor. No cervical spine tenderness to palpation. Cardiovascular: Normal rate, regular rhythm. Grossly normal heart sounds.  Good peripheral circulation. Respiratory: Normal respiratory effort.  No retractions. Lungs CTAB. Gastrointestinal: Soft , nondistended, nontender to palpation. No CVA tenderness. Musculoskeletal: Bilateral BKA  no signs of acute trauma. Neurologic:  Normal speech and language. No gross focal neurologic deficits are appreciated. No gait instability noted. Skin:  Skin is warm, dry and intact. No rash noted. Psychiatric: Mood and affect are normal. Speech and behavior are normal.  ____________________________________________   LABS (all labs ordered are listed, but only abnormal results are displayed)  Labs Reviewed  URINALYSIS, COMPLETE (UACMP) WITH MICROSCOPIC - Abnormal; Notable for the following components:      Result Value   Color, Urine RED (*)    APPearance CLOUDY (*)    Glucose, UA   (*)    Value: TEST NOT REPORTED DUE TO COLOR INTERFERENCE OF URINE PIGMENT   Hgb urine dipstick   (*)    Value: TEST NOT REPORTED DUE TO COLOR INTERFERENCE OF URINE PIGMENT   Bilirubin Urine   (*)    Value: TEST NOT REPORTED DUE TO COLOR INTERFERENCE OF URINE PIGMENT   Ketones, ur   (*)    Value: TEST NOT REPORTED DUE TO COLOR INTERFERENCE OF URINE PIGMENT   Protein, ur   (*)    Value: TEST NOT REPORTED DUE TO COLOR INTERFERENCE OF URINE PIGMENT   Nitrite   (*)    Value: TEST NOT REPORTED DUE TO COLOR INTERFERENCE OF URINE PIGMENT   Leukocytes,Ua   (*)    Value: TEST NOT REPORTED DUE TO COLOR INTERFERENCE OF URINE PIGMENT   RBC / HPF >50 (*)    All other components within normal limits  BASIC METABOLIC PANEL - Abnormal; Notable for the following components:   Glucose, Bld 172 (*)    BUN 42 (*)    Creatinine,  Ser 1.49 (*)    GFR, Estimated 46 (*)    All other components within normal limits  CBC - Abnormal; Notable for the following components:   RBC 3.99 (*)    Hemoglobin 12.1 (*)    HCT 36.9 (*)    All other components within normal limits  URINE CULTURE   ____________________________________________   PROCEDURES and INTERVENTIONS  Procedure(s) performed (including Critical Care):  .1-3  Lead EKG Interpretation Performed by: Vladimir Crofts, MD Authorized by: Vladimir Crofts, MD     Interpretation: normal     ECG rate:  56   ECG rate assessment: bradycardic     Rhythm: sinus bradycardia     Ectopy: none     Conduction: normal      Medications - No data to display  ____________________________________________   MDM / ED COURSE   85 year old male on Xarelto for A. fib presents to the ED with 2 days of painless hematuria amenable to outpatient management with urology follow-up.  Benign exam without abdominal tenderness or evidence of acute pathology.  Blood work is reassuring without evidence of acute derangements.  UA with gross hematuria without infectious features.  He has no dysuria fever or flank pain to suggest failure of outpatient management of his previous UTI or an additional UTI.  His urine is sent for culture.  I spoke with urology on-call who requests temporary holding of his Xarelto and will help facilitate outpatient follow-up.  I discussed return precautions with the patient and he is medically stable discharge home.   Clinical Course as of Apr 18 1233  Fri Apr 18, 2020  1057 Spoke with wife, [place],  [place] RN saw the patient this AM and noticed blood in his urine.  He seemed normal yesterday.    [DS]  1131 Spoke with Dr. Diamantina Providence, urology on-call who requested send him a secure chat with patient information and he will have his office call the patient Monday morning for a follow-up appointment next week.   [DS]    Clinical Course User Index [DS] Vladimir Crofts, MD     ____________________________________________   FINAL CLINICAL IMPRESSION(S) / ED DIAGNOSES  Final diagnoses:  Gross hematuria  Painless hematuria     ED Discharge Orders    None       Camelle Henkels Tamala Julian   Note:  This document was prepared using Dragon voice recognition software and may include unintentional dictation errors.   Vladimir Crofts, MD 04/18/20 1235

## 2020-04-18 NOTE — Discharge Instructions (Signed)
Your urine test confirmed quite a bit of blood in your urine. No signs of repeat UTI at this point considering your lack of symptoms.  Please follow-up with a local urologist, a kidney/bladder doctor. I have attached the phone number for Dr. Diamantina Providence, the local urologist on-call today.   His clinic should call you on Monday morning.  If you do not hear from him by the end of the day on Monday, please call the attached number. They would likely want to do a cystoscopy, this is when they passed the camera through the urethra to look at the bladder from the inside, and/or a CT scan.   Please stop taking your Xarelto until you can see him in the clinic.  He will give you updated information about continuing this medication after that.  If he develops any pain with urination, back pain or abdominal pain, fevers, please return to the ED.

## 2020-04-18 NOTE — ED Notes (Signed)
Very dark urine to lab.  He had voided 400 ml

## 2020-04-18 NOTE — ED Triage Notes (Signed)
Pt to ED via [facility] with c/o from home. Per EMS pt with hx of dementia. Per EMS pt has been urinating blood x 2 days. Per EMS pt denies pain. Per EMS pt with hx of foley use, no foley on arrival. Per EMS pt was changed just prior to arrival. Per EMS pt with hx of HTN, dementia, and chronic UTI.   170/70 54HR 94% RA 18RR

## 2020-04-18 NOTE — ED Notes (Signed)
Pt on phone with his wife. Trying to find a ride home. States wife does not drive.

## 2020-04-18 NOTE — ED Notes (Signed)
Pt keeps stating the EDP told him "nothing" about his diagnosis and discharge even though doctor had thoroughly discussed this with him previously. EDP at bedside again to discuss again with pt and son who is currently at bedside.

## 2020-04-18 NOTE — ED Notes (Signed)
Pt swifting R arm around which has BP cuff on it. Pt irritated stating he does not understand why the hematuria is not being addressed in the hospital. This RN educated further about outpatient appointment. Pt frustrated.

## 2020-04-18 NOTE — ED Notes (Signed)
See triage note.  Patient thinks the blood in urine started today.  Says he is continent and I gave  Him a urinal.  He is in nad.  Denies any pain with urination or pain at all.

## 2020-04-18 NOTE — ED Notes (Signed)
Pt states his son is going to be his ride home. States son is bringing him clothes and his leg attachments to go home in. Pt unsure of when son will arrive.

## 2020-04-20 LAB — URINE CULTURE: Culture: <10000 — AB

## 2020-04-22 ENCOUNTER — Ambulatory Visit (INDEPENDENT_AMBULATORY_CARE_PROVIDER_SITE_OTHER): Payer: Medicare Other | Admitting: Urology

## 2020-04-22 ENCOUNTER — Other Ambulatory Visit: Payer: Self-pay | Admitting: *Deleted

## 2020-04-22 ENCOUNTER — Other Ambulatory Visit
Admission: RE | Admit: 2020-04-22 | Discharge: 2020-04-22 | Disposition: A | Discharge status: Home / Self Care | Payer: Medicare Other | Attending: Urology | Admitting: Urology

## 2020-04-22 ENCOUNTER — Encounter: Payer: Self-pay | Admitting: Urology

## 2020-04-22 ENCOUNTER — Other Ambulatory Visit: Payer: Self-pay

## 2020-04-22 VITALS — BP 134/60 | HR 60 | Ht 67.0 in | Wt 180.0 lb

## 2020-04-22 DIAGNOSIS — R310 Gross hematuria: Secondary | ICD-10-CM

## 2020-04-22 DIAGNOSIS — N390 Urinary tract infection, site not specified: Secondary | ICD-10-CM

## 2020-04-22 DIAGNOSIS — C61 Malignant neoplasm of prostate: Secondary | ICD-10-CM

## 2020-04-22 LAB — URINALYSIS, COMPLETE (UACMP) WITH MICROSCOPIC
Bilirubin Urine: NEGATIVE
Glucose, UA: NEGATIVE mg/dL
Hgb urine dipstick: NEGATIVE
Leukocytes,Ua: NEGATIVE
Nitrite: NEGATIVE
Protein, ur: 30 mg/dL — AB
Specific Gravity, Urine: 1.02 (ref 1.005–1.030)
Squamous Epithelial / HPF: NONE SEEN (ref 0–5)
pH: 7 (ref 5.0–8.0)

## 2020-04-22 NOTE — Patient Instructions (Signed)
   Cystoscopy  Cystoscopy is a procedure that is used to help diagnose and sometimes treat conditions that affect the lower urinary tract. The lower urinary tract includes the bladder and the urethra. The urethra is the tube that drains urine from the bladder. Cystoscopy is done using a thin, tube-shaped instrument with a light and camera at the end (cystoscope). The cystoscope may be hard or flexible, depending on the goal of the procedure. The cystoscope is inserted through the urethra, into the bladder.  Cystoscopy may be recommended if you have:  · Urinary tract infections that keep coming back.  · Blood in the urine (hematuria).  · An inability to control when you urinate (urinary incontinence) or an overactive bladder.  · Unusual cells found in a urine sample.  · A blockage in the urethra, such as a urinary stone.  · Painful urination.  · An abnormality in the bladder found during an intravenous pyelogram (IVP) or CT scan.  Cystoscopy may also be done to remove a sample of tissue to be examined under a microscope (biopsy).  Tell a health care provider about:  · Any allergies you have.  · All medicines you are taking, including vitamins, herbs, eye drops, creams, and over-the-counter medicines.  · Any problems you or family members have had with anesthetic medicines.  · Any blood disorders you have.  · Any surgeries you have had.  · Any medical conditions you have.  · Whether you are pregnant or may be pregnant.  What are the risks?  Generally, this is a safe procedure. However, problems may occur, including:  · Infection.  · Bleeding.  · Allergic reactions to medicines.  · Damage to other structures or organs.  What happens before the procedure?  · Ask your health care provider about:  ? Changing or stopping your regular medicines. This is especially important if you are taking diabetes medicines or blood thinners.  ? Taking medicines such as aspirin and ibuprofen. These medicines can thin your blood. Do not  take these medicines unless your health care provider tells you to take them.  ? Taking over-the-counter medicines, vitamins, herbs, and supplements.  · Follow instructions from your health care provider about eating or drinking restrictions.  · Ask your health care provider what steps will be taken to help prevent infection. These may include:  ? Washing skin with a germ-killing soap.  ? Taking antibiotic medicine.  · You may have an exam or testing, such as:  ? X-rays of the bladder, urethra, or kidneys.  ? Urine tests to check for signs of infection.  · Plan to have someone take you home from the hospital or clinic.  What happens during the procedure?       · You will be given one or more of the following:  ? A medicine to help you relax (sedative).  ? A medicine to numb the area (local anesthetic).  · The area around the opening of your urethra will be cleaned.  · The cystoscope will be passed through your urethra into your bladder.  · Germ-free (sterile) fluid will flow through the cystoscope to fill your bladder. The fluid will stretch your bladder so that your health care provider can clearly examine your bladder walls.  · Your doctor will look at the urethra and bladder. Your doctor may take a biopsy or remove stones.  · The cystoscope will be removed, and your bladder will be emptied.  The procedure may vary among health care   providers and hospitals.  What can I expect after the procedure?  After the procedure, it is common to have:  · Some soreness or pain in your abdomen and urethra.  · Urinary symptoms. These include:  ? Mild pain or burning when you urinate. Pain should stop within a few minutes after you urinate. This may last for up to 1 week.  ? A small amount of blood in your urine for several days.  ? Feeling like you need to urinate but producing only a small amount of urine.  Follow these instructions at home:  Medicines  · Take over-the-counter and prescription medicines only as told by your  health care provider.  · If you were prescribed an antibiotic medicine, take it as told by your health care provider. Do not stop taking the antibiotic even if you start to feel better.  General instructions  · Return to your normal activities as told by your health care provider. Ask your health care provider what activities are safe for you.  · Do not drive for 24 hours if you were given a sedative during your procedure.  · Watch for any blood in your urine. If the amount of blood in your urine increases, call your health care provider.  · Follow instructions from your health care provider about eating or drinking restrictions.  · If a tissue sample was removed for testing (biopsy) during your procedure, it is up to you to get your test results. Ask your health care provider, or the department that is doing the test, when your results will be ready.  · Drink enough fluid to keep your urine pale yellow.  · Keep all follow-up visits as told by your health care provider. This is important.  Contact a health care provider if you:  · Have pain that gets worse or does not get better with medicine, especially pain when you urinate.  · Have trouble urinating.  · Have more blood in your urine.  Get help right away if you:  · Have blood clots in your urine.  · Have abdominal pain.  · Have a fever or chills.  · Are unable to urinate.  Summary  · Cystoscopy is a procedure that is used to help diagnose and sometimes treat conditions that affect the lower urinary tract.  · Cystoscopy is done using a thin, tube-shaped instrument with a light and camera at the end.  · After the procedure, it is common to have some soreness or pain in your abdomen and urethra.  · Watch for any blood in your urine. If the amount of blood in your urine increases, call your health care provider.  · If you were prescribed an antibiotic medicine, take it as told by your health care provider. Do not stop taking the antibiotic even if you start to feel  better.  This information is not intended to replace advice given to you by your health care provider. Make sure you discuss any questions you have with your health care provider.  Document Revised: 05/02/2018 Document Reviewed: 05/02/2018  Elsevier Patient Education © 2020 [employer]   

## 2020-04-22 NOTE — Progress Notes (Signed)
04/22/20 2:53 PM   [name] February 05, 1935 [id number]  CC: gross hematuria, history of prostate cancer  HPI: I saw [name] and his family in urology clinic for evaluation of gross hematuria.  He is an extremely comorbid and frail-appearing 85 year old male with a history of atrial fibrillation on anticoagulation, CHF, CKD, diabetes, peripheral vascular disease, and low risk prostate cancer treated prostatectomy over 20 years ago(PSA remains undetectable) who was recently seen in the ED for 24 hours of painless gross hematuria.  He denies any history of radiation for prostate cancer.  He has significant stress incontinence at baseline after his prostatectomy.  He also has 2 culture proven UTIs over the last 6 weeks that were treated with culture appropriate antibiotics.  Urinalysis in the ED showed microscopic hematuria but no other abnormalities, and urine culture was negative.  He denied any flank or abdominal pain.  He held his Xarelto, and his hematuria cleared within 24 hours.  He has been voiding yellow urine spontaneously since then.  Recent imaging consists of a normal renal ultrasound from October 2021, as well as a CT abdomen pelvis without contrast from July 2021 that showed no renal masses, but bladder evaluation was limited secondary to an artificial hip.  He has a 10-pack-year smoking history, and quit over 20 years ago.  He denies any gross hematuria prior to this isolated episode.  Urinalysis today is benign with rare bacteria, 0-5 RBCs, 0-5 WBCs, negative leukocytes, nitrite negative.  PMH: Past Medical History:  Diagnosis Date  . Arthritis   . Atrial fibrillation ([place])   . Carcinoma of prostate ([place])   . CHF (congestive heart failure) ([place])   . Chronic kidney disease   . Diabetes mellitus without complication ([place])   . ED (erectile dysfunction)   . Frequent urination   . Hyperlipidemia   . Hypertension   . Moderate mitral insufficiency   . Peripheral  vascular disease ([place])   . Pneumonia 04/2016  . Prostate cancer ([place])   . Sleep apnea    OSA--USE C-PAP  . Ulcer of left foot due to type 2 diabetes mellitus ([place])   . Urinary stress incontinence, male     Surgical History: Past Surgical History:  Procedure Laterality Date  . AMPUTATION Left 01/12/2017   Procedure: AMPUTATION BELOW KNEE;  Surgeon: Algernon Huxley, MD;  Location: [facility] ORS;  Service: General;  Laterality: Left;  . AMPUTATION Right 12/27/2018   Procedure: AMPUTATION BELOW KNEE;  Surgeon: Algernon Huxley, MD;  Location: [facility];  Service: General;  Laterality: Right;  . APLIGRAFT PLACEMENT Left 10/15/2016   Procedure: APLIGRAFT PLACEMENT;  Surgeon: Albertine Patricia, DPM;  Location: [facility];  Service: Podiatry;  Laterality: Left;  necrotic ulcer  . CHOLECYSTECTOMY    . COLONOSCOPY WITH PROPOFOL N/A 01/02/2019   Procedure: COLONOSCOPY WITH PROPOFOL;  Surgeon: Lucilla Lame, MD;  Location[facility];  Service: Endoscopy;  Laterality: N/A;  . COLONOSCOPY WITH PROPOFOL N/A 01/05/2019   Procedure: COLONOSCOPY WITH PROPOFOL;  Surgeon: Lucilla Lame, MD;  Location: [facility] ENDOSCOPY;  Service: Endoscopy;  Laterality: N/A;  . ESOPHAGOGASTRODUODENOSCOPY (EGD) WITH PROPOFOL N/A 01/02/2019   Procedure: ESOPHAGOGASTRODUODENOSCOPY (EGD) WITH PROPOFOL;  Surgeon: Lucilla Lame, MD;  Location: [facility] ENDOSCOPY;  Service: Endoscopy;  Laterality: N/A;  . EYE SURGERY Bilateral    Cataract Extraction with IOL  . HIP ARTHROPLASTY Right 10/13/2018   Procedure: ARTHROPLASTY BIPOLAR HIP (HEMIARTHROPLASTY);  Surgeon: Earnestine Leys, MD;  Location: [facility];  Service: Orthopedics;  Laterality: Right;  .  IRRIGATION AND DEBRIDEMENT FOOT Left 10/15/2016   Procedure: IRRIGATION AND DEBRIDEMENT FOOT-EXCISIONAL DEBRIDEMENT OF SKIN 3RD, 4TH AND 5TH TOES WITH APPLICATION OF APLIGRAFT;  Surgeon: Albertine Patricia, DPM;  Location: [facility];  Service: Podiatry;  Laterality: Left;  necrotic,gangrene  . IRRIGATION AND  DEBRIDEMENT FOOT Left 12/09/2016   Procedure: IRRIGATION AND DEBRIDEMENT FOOT-MUSCLE/FASCIA, RESECTION OF FOURTH AND FIFTH METATARSAL NECROTIC BONE;  Surgeon: Albertine Patricia, DPM;  Location: [facility];  Service: Podiatry;  Laterality: Left;  . IRRIGATION AND DEBRIDEMENT FOOT Right 12/23/2018   Procedure: IRRIGATION AND DEBRIDEMENT FOOT and wound vac application;  Surgeon: Samara Deist, DPM;  Location: [facility];  Service: Podiatry;  Laterality: Right;  . LEFT HEART CATH N/A 06/25/2019   Procedure: Left Heart Cath and Coronary Angiography;  Surgeon: Isaias Cowman, MD;  Location: [place] [facility];  Service: Cardiovascular;  Laterality: N/A;  . LEFT HEART CATH AND CORONARY ANGIOGRAPHY N/A 06/05/2018   Procedure: LEFT HEART CATH AND CORONARY ANGIOGRAPHY;  Surgeon: Yolonda Kida, MD;  Location: [place] [facility];  Service: Cardiovascular;  Laterality: N/A;  . LOWER EXTREMITY ANGIOGRAPHY Left 08/16/2016   Procedure: Lower Extremity Angiography;  Surgeon: Algernon Huxley, MD;  Location: [place] [facility];  Service: Cardiovascular;  Laterality: Left;  . LOWER EXTREMITY ANGIOGRAPHY Left 09/01/2016   Procedure: Lower Extremity Angiography;  Surgeon: Algernon Huxley, MD;  Location: [place] [facility];  Service: Cardiovascular;  Laterality: Left;  . LOWER EXTREMITY ANGIOGRAPHY Left 10/14/2016   Procedure: Lower Extremity Angiography;  Surgeon: Algernon Huxley, MD;  Location: [place] [facility];  Service: Cardiovascular;  Laterality: Left;  . LOWER EXTREMITY ANGIOGRAPHY Left 12/20/2016   Procedure: Lower Extremity Angiography;  Surgeon: Algernon Huxley, MD;  Location: [place] [facility];  Service: Cardiovascular;  Laterality: Left;  . LOWER EXTREMITY ANGIOGRAPHY Right 12/18/2018   Procedure: LOWER EXTREMITY ANGIOGRAPHY;  Surgeon: Algernon Huxley, MD;  Location: [place] [facility];  Service: Cardiovascular;  Laterality: Right;  . LOWER EXTREMITY INTERVENTION  09/01/2016   Procedure: Lower Extremity  Intervention;  Surgeon: Algernon Huxley, MD;  Location: [place] [facility];  Service: Cardiovascular;;  . PROSTATE SURGERY     removal  . TONSILLECTOMY     as a child    Family History: Family History  Problem Relation Age of Onset  . Hypertension Mother   . Diabetes Mother   . Prostate cancer Neg Hx   . Bladder Cancer Neg Hx   . Kidney disease Neg Hx     Social History:  reports that he quit smoking about 25 years ago. His smoking use included cigarettes. He smoked 0.25 packs per day. He has never used smokeless tobacco. He reports that he does not drink alcohol and does not use drugs.  Physical Exam: BP 134/60   Pulse 60   Ht 5\' 7"  (1.702 m)   Wt 180 lb (81.6 kg)   BMI 28.19 kg/m    Constitutional: Frail-appearing, in wheelchair Bilateral lower extremity amputations  Laboratory Data: Reviewed, see HPI  Pertinent Imaging: I have personally viewed and interpreted the CT abdomen pelvis without contrast from July 2021 that shows no hydronephrosis, stone disease, or renal masses, unable to evaluate bladder secondary to artificial hip artifact, and skin limited secondary to lack of contrast.  Assessment & Plan:   He is a very comorbid 85 year old male who had 24 hours of painless gross hematuria that resolved spontaneously by holding Xarelto.  He has a history of prostatectomy  for low risk prostate cancer, and PSA remains undetectable.  He denies any history of radiation  We discussed common possible etiologies of hematuria including BPH, malignancy, urolithiasis, medical renal disease, and idiopathic. Standard workup recommended by the [facility] includes imaging with CT urogram to assess the upper tracts, and cystoscopy.  He cannot get a contrasted CT secondary to his contrast allergy and CKD, but no significant renal abnormalities on recent renal ultrasound and noncontrast abdomen pelvis CT scan.  He and his family are hesitant to undergo cystoscopy since his hematuria resolved so  quickly after holding anticoagulation.  We discussed at length that this does not rule out a bladder malignancy or other pathology, and I recommended cystoscopy to complete work-up.  They would like to hold off at this time, and reconsider if he develops recurrent gross hematuria.  They would like to follow-up as needed, and will call if he develops recurrent gross hematuria and they are amenable to pursuing cystoscopy   I spent 45 total minutes on the day of the encounter including pre-visit review of the medical record, face-to-face time with the patient, and post visit ordering of labs/imaging/tests.  Nickolas Madrid, MD 04/22/2020  [facility] [address] [place], [place] [address] [phone]

## 2020-05-28 ENCOUNTER — Emergency Department: Payer: Medicare Other

## 2020-05-28 ENCOUNTER — Encounter: Payer: Self-pay | Admitting: Emergency Medicine

## 2020-05-28 ENCOUNTER — Other Ambulatory Visit: Payer: Self-pay

## 2020-05-28 DIAGNOSIS — Z794 Long term (current) use of insulin: Secondary | ICD-10-CM | POA: Diagnosis not present

## 2020-05-28 DIAGNOSIS — Z7901 Long term (current) use of anticoagulants: Secondary | ICD-10-CM | POA: Insufficient documentation

## 2020-05-28 DIAGNOSIS — Z8546 Personal history of malignant neoplasm of prostate: Secondary | ICD-10-CM | POA: Insufficient documentation

## 2020-05-28 DIAGNOSIS — R4182 Altered mental status, unspecified: Secondary | ICD-10-CM | POA: Insufficient documentation

## 2020-05-28 DIAGNOSIS — N189 Chronic kidney disease, unspecified: Secondary | ICD-10-CM | POA: Insufficient documentation

## 2020-05-28 DIAGNOSIS — I5021 Acute systolic (congestive) heart failure: Secondary | ICD-10-CM | POA: Diagnosis not present

## 2020-05-28 DIAGNOSIS — Z8616 Personal history of COVID-19: Secondary | ICD-10-CM | POA: Diagnosis not present

## 2020-05-28 DIAGNOSIS — Z91040 Latex allergy status: Secondary | ICD-10-CM | POA: Diagnosis not present

## 2020-05-28 DIAGNOSIS — I130 Hypertensive heart and chronic kidney disease with heart failure and stage 1 through stage 4 chronic kidney disease, or unspecified chronic kidney disease: Secondary | ICD-10-CM | POA: Insufficient documentation

## 2020-05-28 DIAGNOSIS — I5032 Chronic diastolic (congestive) heart failure: Secondary | ICD-10-CM | POA: Diagnosis not present

## 2020-05-28 DIAGNOSIS — Z7984 Long term (current) use of oral hypoglycemic drugs: Secondary | ICD-10-CM | POA: Insufficient documentation

## 2020-05-28 DIAGNOSIS — Z7982 Long term (current) use of aspirin: Secondary | ICD-10-CM | POA: Diagnosis not present

## 2020-05-28 DIAGNOSIS — Z79899 Other long term (current) drug therapy: Secondary | ICD-10-CM | POA: Insufficient documentation

## 2020-05-28 DIAGNOSIS — E1122 Type 2 diabetes mellitus with diabetic chronic kidney disease: Secondary | ICD-10-CM | POA: Insufficient documentation

## 2020-05-28 DIAGNOSIS — Z87891 Personal history of nicotine dependence: Secondary | ICD-10-CM | POA: Diagnosis not present

## 2020-05-28 DIAGNOSIS — I2510 Atherosclerotic heart disease of native coronary artery without angina pectoris: Secondary | ICD-10-CM | POA: Diagnosis not present

## 2020-05-28 LAB — COMPREHENSIVE METABOLIC PANEL
ALT: 31 U/L (ref 0–44)
AST: 41 U/L (ref 15–41)
Albumin: 3.7 g/dL (ref 3.5–5.0)
Alkaline Phosphatase: 109 U/L (ref 38–126)
Anion gap: 13 (ref 5–15)
BUN: 44 mg/dL — ABNORMAL HIGH (ref 8–23)
CO2: 26 mmol/L (ref 22–32)
Calcium: 9 mg/dL (ref 8.9–10.3)
Chloride: 105 mmol/L (ref 98–111)
Creatinine, Ser: 1.47 mg/dL — ABNORMAL HIGH (ref 0.61–1.24)
GFR, Estimated: 47 mL/min — ABNORMAL LOW (ref >60)
Glucose, Bld: 195 mg/dL — ABNORMAL HIGH (ref 70–99)
Potassium: 4.4 mmol/L (ref 3.5–5.1)
Sodium: 144 mmol/L (ref 135–145)
Total Bilirubin: 1 mg/dL (ref 0.3–1.2)
Total Protein: 7.6 g/dL (ref 6.5–8.1)

## 2020-05-28 LAB — CBC WITH DIFFERENTIAL/PLATELET
Abs Immature Granulocytes: 0.06 10*3/uL (ref 0.00–0.07)
Basophils Absolute: 0.1 10*3/uL (ref 0.0–0.1)
Basophils Relative: 1 %
Eosinophils Absolute: 0.6 10*3/uL — ABNORMAL HIGH (ref 0.0–0.5)
Eosinophils Relative: 6 %
HCT: 43.7 % (ref 39.0–52.0)
Hemoglobin: 14.8 g/dL (ref 13.0–17.0)
Immature Granulocytes: 1 %
Lymphocytes Relative: 15 %
Lymphs Abs: 1.5 10*3/uL (ref 0.7–4.0)
MCH: 30.5 pg (ref 26.0–34.0)
MCHC: 33.9 g/dL (ref 30.0–36.0)
MCV: 89.9 fL (ref 80.0–100.0)
Monocytes Absolute: 1 10*3/uL (ref 0.1–1.0)
Monocytes Relative: 9 %
Neutro Abs: 7.4 10*3/uL (ref 1.7–7.7)
Neutrophils Relative %: 68 %
Platelets: 244 10*3/uL (ref 150–400)
RBC: 4.86 MIL/uL (ref 4.22–5.81)
RDW: 13.9 % (ref 11.5–15.5)
WBC: 10.6 10*3/uL — ABNORMAL HIGH (ref 4.0–10.5)
nRBC: 0 % (ref 0.0–0.2)

## 2020-05-28 LAB — TROPONIN I (HIGH SENSITIVITY)
Troponin I (High Sensitivity): 95 ng/L — ABNORMAL HIGH (ref <18)
Troponin I (High Sensitivity): 94 ng/L — ABNORMAL HIGH (ref <18)

## 2020-05-28 NOTE — ED Triage Notes (Signed)
See first RN Note: Pt denies pain at this time. Pt alert but confused at this time.   Pt disoriented to city, year, and situation, pt is noted to be calm and cooperative with staff in triage. Pt states he does not know why he is here.

## 2020-05-28 NOTE — ED Notes (Addendum)
First RN note:  Pt comes into the ED via [facility] from home c/o AMS according to [facility] RN.  Pt is a paraplegic with significant h/o UTI's and DM.  All VSS with EMS CBG 212 but present with a foul odor of urine.  A-fib at rate of 82. 150/78.   Pt does not normally wear O2 but was in the low 90's so they placed him on 2L and now sating at 98%

## 2020-05-29 ENCOUNTER — Emergency Department
Admission: EM | Admit: 2020-05-29 | Discharge: 2020-05-29 | Disposition: A | Discharge status: Home / Self Care | Payer: Medicare Other | Attending: Emergency Medicine | Admitting: Emergency Medicine

## 2020-05-29 DIAGNOSIS — R4182 Altered mental status, unspecified: Secondary | ICD-10-CM

## 2020-05-29 LAB — URINALYSIS, COMPLETE (UACMP) WITH MICROSCOPIC
Bacteria, UA: NONE SEEN
Bilirubin Urine: NEGATIVE
Glucose, UA: NEGATIVE mg/dL
Hgb urine dipstick: NEGATIVE
Ketones, ur: NEGATIVE mg/dL
Leukocytes,Ua: NEGATIVE
Nitrite: NEGATIVE
Protein, ur: 30 mg/dL — AB
Specific Gravity, Urine: 1.02 (ref 1.005–1.030)
pH: 5 (ref 5.0–8.0)

## 2020-05-29 NOTE — Discharge Instructions (Signed)
If you develop any further worsening confusion, particularly with fevers, passing out, chest pain or other acute symptoms, please return to the ED.  Otherwise, continue your chronic meds and follow up with your PCP

## 2020-05-29 NOTE — ED Provider Notes (Signed)
[facility] Emergency Department Provider Note   ____________________________________________   Event Date/Time   First MD Initiated Contact with Patient 05/29/20 [phone]     (approximate)  I have reviewed the triage vital signs and the nursing notes.   HISTORY  Chief Complaint Altered Mental Status    HPI [name] is a 86 y.o. male with history of A. fib on Xarelto, CHF, chronic kidney disease, hypertension, hyperlipidemia and diabetes who presents to the emergency department  with concerns for 2 days of altered mental status.  History is obtained by patient's wife Millie.  She states that the patient was confused over the past couple of days and not talking as much as normal.  At this time patient is A&O x4.  He denies any chest pain or chest discomfort.  No headache.  No recent head injuries.  No abdominal pain.  No numbness or focal weakness.  He states that he is upset because he has had to wait in the ED since 6 PM last night before being seen.  He is able to tell me that he was brought here by EMS.  He states he is ready to go home.    Past Medical History:  Diagnosis Date  . Arthritis   . Atrial fibrillation ([place])   . Carcinoma of prostate ([place])   . CHF (congestive heart failure) ([place])   . Chronic kidney disease   . Diabetes mellitus without complication ([place])   . ED (erectile dysfunction)   . Frequent urination   . Hyperlipidemia   . Hypertension   . Moderate mitral insufficiency   . Peripheral vascular disease ([place])   . Pneumonia 04/2016  . Prostate cancer ([place])   . Sleep apnea    OSA--USE C-PAP  . Ulcer of left foot due to type 2 diabetes mellitus ([place])   . Urinary stress incontinence, male     Patient Active Problem List   Diagnosis Date Noted  . AMS (altered mental status) 04/08/2020  . CAD (coronary artery disease) 03/06/2020  . Personal history of COVID-19 03/06/2020  . Acute metabolic encephalopathy [id number]  .  UTI (urinary tract infection) 03/06/2020  . (HFpEF) heart failure with preserved ejection fraction ([place]) 03/06/2020  . Goals of care, counseling/discussion   . Palliative care by specialist   . Seizures ([place]) 11/30/2019  . ACS (acute coronary syndrome) ([name]) 06/24/2019  . Bradycardia 05/16/2019  . Paroxysmal A-fib ([place]) 03/27/2019  . Hx of BKA, right ([place]) 03/02/2019  . Severe protein-calorie malnutrition ([place]) 02/16/2019  . Acute systolic CHF (congestive heart failure) ([place]) 02/16/2019  . Acute respiratory failure with hypoxia ([place]) 02/08/2019  . Pneumonia due to COVID-19 virus 02/08/2019  . Below-knee amputation of right lower extremity ([place]) 01/15/2019  . Polyp of descending colon   . Iron deficiency anemia due to chronic blood loss   . Sacral decubitus ulcer, stage II ([place]) 12/26/2018  . Acute respiratory failure ([place]) 12/20/2018  . Femur fracture ([place]) 10/13/2018  . Chest pain 06/03/2018  . NSTEMI (non-ST elevated myocardial infarction) ([place]) 01/16/2018  . Acute CHF (congestive heart failure) ([place]) 11/23/2017  . Atrial fibrillation with rapid ventricular response ([place]) 10/15/2017  . BKA stump complication ([place]) 0000000  . 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 ([place]) 02/10/2017  . S/P BKA (below knee amputation) unilateral, left ([place]) 02/01/2017  . Chronic diastolic heart failure ([place]) 01/21/2017  . Pressure injury of skin 01/18/2017  . Atherosclerosis of artery of extremity with gangrene ([place])  01/05/2017  . Diabetic foot ulcer ([place]) 12/29/2016  . Ataxia 10/21/2016  . Atherosclerosis of native arteries of the extremities with ulceration ([place]) 10/12/2016  . History of femoral angiogram 09/06/2016  . Left leg pain 09/06/2016  . Leukocytosis 09/06/2016  . Generalized weakness 09/06/2016  . AKI (acute kidney injury) ([place]) 09/06/2016  . Atrial fibrillation, chronic ([place]) 08/30/2016  . Pneumonia 05/19/2016  . Hyperlipidemia 04/20/2016  . Back pain 04/19/2016   . Type 2 diabetes mellitus treated with insulin ([place]) 04/19/2016  . Essential hypertension 04/19/2016  . PAD (peripheral artery disease) ([place]) 04/19/2016  . Erectile dysfunction following radical prostatectomy 06/25/2015  . History of prostate cancer 12/23/2014  . Incontinence 12/23/2014    Past Surgical History:  Procedure Laterality Date  . AMPUTATION Left 01/12/2017   Procedure: AMPUTATION BELOW KNEE;  Surgeon: Algernon Huxley, MD;  Location: [facility] ORS;  Service: General;  Laterality: Left;  . AMPUTATION Right 12/27/2018   Procedure: AMPUTATION BELOW KNEE;  Surgeon: Algernon Huxley, MD;  Location: [facility] ORS;  Service: General;  Laterality: Right;  . APLIGRAFT PLACEMENT Left 10/15/2016   Procedure: APLIGRAFT PLACEMENT;  Surgeon: Albertine Patricia, DPM;  Location: [facility] ORS;  Service: Podiatry;  Laterality: Left;  necrotic ulcer  . CHOLECYSTECTOMY    . COLONOSCOPY WITH PROPOFOL N/A 01/02/2019   Procedure: COLONOSCOPY WITH PROPOFOL;  Surgeon: Lucilla Lame, MD;  Location[facility]COPY;  Service: Endoscopy;  Laterality: N/A;  . COLONOSCOPY WITH PROPOFOL N/A 01/05/2019   Procedure: COLONOSCOPY WITH PROPOFOL;  Surgeon: Lucilla Lame, MD;  Location: [facility] ENDOSCOPY;  Service: Endoscopy;  Laterality: N/A;  . ESOPHAGOGASTRODUODENOSCOPY (EGD) WITH PROPOFOL N/A 01/02/2019   Procedure: ESOPHAGOGASTRODUODENOSCOPY (EGD) WITH PROPOFOL;  Surgeon: Lucilla Lame, MD;  Location: [facility] ENDOSCOPY;  Service: Endoscopy;  Laterality: N/A;  . EYE SURGERY Bilateral    Cataract Extraction with IOL  . HIP ARTHROPLASTY Right 10/13/2018   Procedure: ARTHROPLASTY BIPOLAR HIP (HEMIARTHROPLASTY);  Surgeon: Earnestine Leys, MD;  Location: [facility] ORS;  Service: Orthopedics;  Laterality: Right;  . IRRIGATION AND DEBRIDEMENT FOOT Left 10/15/2016   Procedure: IRRIGATION AND DEBRIDEMENT FOOT-EXCISIONAL DEBRIDEMENT OF SKIN 3RD, 4TH AND 5TH TOES WITH APPLICATION OF APLIGRAFT;  Surgeon: Albertine Patricia, DPM;  Location: [facility] ORS;  Service: Podiatry;   Laterality: Left;  necrotic,gangrene  . IRRIGATION AND DEBRIDEMENT FOOT Left 12/09/2016   Procedure: IRRIGATION AND DEBRIDEMENT FOOT-MUSCLE/FASCIA, RESECTION OF FOURTH AND FIFTH METATARSAL NECROTIC BONE;  Surgeon: Albertine Patricia, DPM;  Location: [facility] ORS;  Service: Podiatry;  Laterality: Left;  . IRRIGATION AND DEBRIDEMENT FOOT Right 12/23/2018   Procedure: IRRIGATION AND DEBRIDEMENT FOOT and wound vac application;  Surgeon: Samara Deist, DPM;  Location: [facility] ORS;  Service: Podiatry;  Laterality: Right;  . LEFT HEART CATH N/A 06/25/2019   Procedure: Left Heart Cath and Coronary Angiography;  Surgeon: Isaias Cowman, MD;  Location: [place] CV LAB;  Service: Cardiovascular;  Laterality: N/A;  . LEFT HEART CATH AND CORONARY ANGIOGRAPHY N/A 06/05/2018   Procedure: LEFT HEART CATH AND CORONARY ANGIOGRAPHY;  Surgeon: Yolonda Kida, MD;  Location: [place] CV LAB;  Service: Cardiovascular;  Laterality: N/A;  . LOWER EXTREMITY ANGIOGRAPHY Left 08/16/2016   Procedure: Lower Extremity Angiography;  Surgeon: Algernon Huxley, MD;  Location: [place] CV LAB;  Service: Cardiovascular;  Laterality: Left;  . LOWER EXTREMITY ANGIOGRAPHY Left 09/01/2016   Procedure: Lower Extremity Angiography;  Surgeon: Algernon Huxley, MD;  Location: [place] CV LAB;  Service: Cardiovascular;  Laterality: Left;  . LOWER EXTREMITY ANGIOGRAPHY Left 10/14/2016  Procedure: Lower Extremity Angiography;  Surgeon: Annice Needy, MD;  Location: [facility] INVASIVE CV LAB;  Service: Cardiovascular;  Laterality: Left;  . LOWER EXTREMITY ANGIOGRAPHY Left 12/20/2016   Procedure: Lower Extremity Angiography;  Surgeon: Annice Needy, MD;  Location: [facility] IN[facility] CV LAB;  Service: Cardiovascular;  Laterality: Left;  . LOWER EXTREMITY ANGIOGRAPHY Right 12/18/2018   Procedure: LOWER EXTREMITY ANGIOGRAPHY;  Surgeon: Annice Needy, MD;  Location: [facility] LAB;  Service: Cardiovascular;  Laterality: Right;  . LOWER EXTREMITY  INTERVENTION  09/01/2016   Procedure: Lower Extremity Intervention;  Surgeon: Annice Needy, MD;  Location: [facility] INV[facility] CV LAB;  Service: Cardiovascular;;  . PROSTATE SURGERY     removal  . TONSILLECTOMY     as a child    Prior to Admission medications   Medication Sig Start Date End Date Taking? Authorizing Provider  aspirin EC 81 MG tablet Take 81 mg by mouth daily. Swallow whole.    [provider]  atorvastatin (LIPITOR) 80 MG tablet Take 80 mg by mouth at bedtime.    [provider]  bisacodyl (DULCOLAX) 5 MG EC tablet Take 1 tablet (5 mg total) by mouth daily as needed for moderate constipation. 04/10/20   Arnetha Courser, MD  chlorthalidone (HYGROTON) 25 MG tablet Take 25 mg by mouth daily.    [provider]  diltiazem (DILACOR XR) 120 MG 24 hr capsule Take 120 mg by mouth daily.    [provider]  divalproex (DEPAKOTE) 500 MG DR tablet Take 500 mg by mouth daily.  02/18/20   [provider]  Ensure Max Protein (ENSURE MAX PROTEIN) LIQD Take 330 mLs (11 oz total) by mouth 2 (two) times daily. 04/10/20   Arnetha Courser, MD  ferrous sulfate 325 (65 FE) MG tablet Take 325 mg by mouth daily with breakfast.    [provider]  gabapentin (NEURONTIN) 300 MG capsule Take 300 mg by mouth 3 (three) times daily.  02/07/19   [provider]  Insulin Glargine (BASAGLAR KWIKPEN) 100 UNIT/ML Inject 28 Units into the skin at bedtime. 04/08/20   [provider]  isosorbide mononitrate (IMDUR) 30 MG 24 hr tablet Take 60 mg by mouth daily. 11/07/19   [provider]  KLOR-CON M20 20 MEQ tablet Take 20 mEq by mouth 2 (two) times daily. 02/18/20   [provider]  metFORMIN (GLUCOPHAGE) 500 MG tablet Take 500 mg by mouth 2 (two) times daily with a meal.    [provider]  metoprolol succinate (TOPROL-XL) 25 MG 24 hr tablet Take 1 tablet (25 mg total) by mouth daily. Take with or immediately following a meal.  04/10/20   Arnetha Courser, MD  NOVOLOG FLEXPEN 100 UNIT/ML FlexPen Inject 4 Units into the skin 3 (three) times daily with meals. 04/08/20   [provider]  Rivaroxaban (XARELTO) 15 MG TABS tablet Take 1 tablet (15 mg total) by mouth daily with supper. Hold Xarelto for 3 days.  Start on January 09, 2019. 01/09/19   Shaune Pollack, MD    Allergies Contrast media [iodinated diagnostic agents], Iohexol, Metrizamide, and Latex  Family History  Problem Relation Age of Onset  . Hypertension Mother   . Diabetes Mother   . Prostate cancer Neg Hx   . Bladder Cancer Neg Hx   . Kidney disease Neg Hx     Social History Social History   Tobacco Use  . Smoking status: Former Smoker    Packs/day: 0.25  Types: Cigarettes    Quit date: 10/14/1994    Years since quitting: 25.6  . Smokeless tobacco: Never Used  Vaping Use  . Vaping Use: Never used  Substance Use Topics  . Alcohol use: No    Alcohol/week: 0.0 standard drinks  . Drug use: No    Review of Systems  Constitutional: No fever/chills Eyes: No visual changes. ENT: No sore throat. Cardiovascular: Denies chest pain. Respiratory: Denies shortness of breath. Gastrointestinal: No abdominal pain.  No nausea, no vomiting.  No diarrhea.  No constipation. Genitourinary: Negative for dysuria. Musculoskeletal: Negative for back pain. Skin: Negative for rash. Neurological: Negative for headaches, focal weakness or numbness.   ____________________________________________   PHYSICAL EXAM:  VITAL SIGNS: ED Triage Vitals [05/28/20 1856]  Enc Vitals Group     BP (!) 143/66     Pulse Rate 71     Resp (!) 22     Temp 98.1 F (36.7 C)     Temp Source Oral     SpO2 92 %     Weight 179 lb 14.3 oz (81.6 kg)     Height      Head Circumference      Peak Flow      Pain Score 0     Pain Loc      Pain Edu?      Excl. in [place]?     Constitutional: Alert and oriented x4. Well appearing and in no acute distress.  Elderly.  Answers  questions appropriately. Eyes: Conjunctivae are normal. PERRL. EOMI. Head: Atraumatic. Nose: No congestion/rhinnorhea. Mouth/Throat: Mucous membranes are moist.  Oropharynx non-erythematous. Neck: No stridor.   Cardiovascular: Normal rate, regular rhythm. Grossly normal heart sounds.  Good peripheral circulation. Respiratory: Normal respiratory effort.  No retractions. Lungs CTAB. Gastrointestinal: Soft and nontender. No distention. No abdominal bruits. No CVA tenderness. Musculoskeletal: No lower extremity tenderness nor edema.  No joint effusions.  Bilateral BKA's.  No redness or warmth to his lower extremities. Neurologic:  Normal speech and language. No gross focal neurologic deficits are appreciated.  Reports normal sensation diffusely.  Moves all extremities equally.  Cranial nerves II to XII intact. Skin:  Skin is warm, dry and intact. No rash noted. Psychiatric: Mood and affect are normal. Speech and behavior are normal.  ____________________________________________   LABS (all labs ordered are listed, but only abnormal results are displayed)  Labs Reviewed  CBC WITH DIFFERENTIAL/PLATELET - Abnormal; Notable for the following components:      Result Value   WBC 10.6 (*)    Eosinophils Absolute 0.6 (*)    All other components within normal limits  COMPREHENSIVE METABOLIC PANEL - Abnormal; Notable for the following components:   Glucose, Bld 195 (*)    BUN 44 (*)    Creatinine, Ser 1.47 (*)    GFR, Estimated 47 (*)    All other components within normal limits  TROPONIN I (HIGH SENSITIVITY) - Abnormal; Notable for the following components:   Troponin I (High Sensitivity) 95 (*)    All other components within normal limits  TROPONIN I (HIGH SENSITIVITY) - Abnormal; Notable for the following components:   Troponin I (High Sensitivity) 94 (*)    All other components within normal limits  URINALYSIS, COMPLETE (UACMP) WITH MICROSCOPIC    ____________________________________________  EKG   EKG Interpretation  Date/Time:  Wednesday May 28 2020 18:54:12 EST Ventricular Rate:  71 PR Interval:    QRS Duration: 80 QT Interval:  418 QTC Calculation: 454 R Axis:   -  16 Text Interpretation: Normal sinus rhythm Anteroseptal infarct , age undetermined ST & T wave abnormality, consider inferior ischemia Abnormal ECG No significant change since last tracing Confirmed by Pryor Curia [phone]) on 05/29/2020 6:19:13 AM       ____________________________________________  RADIOLOGY  ED MD interpretation: Chest x-ray shows no acute abnormality.  Official radiology report(s): DG Chest 2 View  Result Date: 05/28/2020 CLINICAL DATA:  Altered mental status with decreased oxygen saturation EXAM: CHEST - 2 VIEW COMPARISON:  April 08, 2020 FINDINGS: There is mild bibasilar atelectasis. No edema or airspace opacity. Heart is mildly enlarged with pulmonary vascularity normal. There is aortic atherosclerosis. No adenopathy. No bone lesions. IMPRESSION: Bibasilar atelectasis. No edema or airspace opacity. Stable cardiac prominence. Aortic Atherosclerosis (ICD10-I70.0). Electronically Signed   By: Lowella Grip III M.D.   On: 05/28/2020 19:25    ____________________________________________   PROCEDURES  Procedure(s) performed: None  Procedures  Critical Care performed: No  ____________________________________________   INITIAL IMPRESSION / ASSESSMENT AND PLAN / ED COURSE  As part of my medical decision making, I reviewed the following data within the [place] History obtained from family, Nursing notes reviewed and incorporated, Labs reviewed, EKG interpreted NSR and no new ischemic change, Notes from prior ED visits and  Controlled Substance Database    Patient here with concerns for altered mental status for the past couple of days.  Currently has no complaints.  He is able to tell me that he has  been waiting for 12 hours to be seen unfortunately.  He is alert and oriented x4.  He has no focal neurologic deficits.  No infectious symptoms.  No chest pain or shortness of breath.  He does have very mildly elevated high-sensitivity troponins but these are stable and in the setting of chronic kidney disease.  It appears that he always has elevated troponins.  His troponins today are flat.  His EKG is nonischemic and unchanged compared to previous.  He did have some low oxygen saturations in the low 90s documented initially but now is satting 97 to 98% on room air and denies shortness of breath.  His chest x-ray shows no acute abnormality.  He denies any COVID-19 symptoms.   Urinalysis pending.  Signed out to oncoming ED physician Dr. Tamala Julian to follow-up on urinalysis results.  Plan is to discharge patient.  Patient no longer has any altered mental status.   ____________________________________________   FINAL CLINICAL IMPRESSION(S) / ED DIAGNOSES  Final diagnoses:  Altered mental status, unspecified altered mental status type     ED Discharge Orders    None       Note:  This document was prepared using Dragon voice recognition software and may include unintentional dictation errors.    Kynlea Blackston, Delice Bison, DO 05/29/20 [phone]

## 2020-06-25 ENCOUNTER — Other Ambulatory Visit: Payer: Self-pay

## 2020-06-25 ENCOUNTER — Observation Stay
Admission: EM | Admit: 2020-06-25 | Discharge: 2020-06-26 | Disposition: A | Discharge status: Home / Self Care | Payer: Medicare Other | Attending: Internal Medicine | Admitting: Internal Medicine

## 2020-06-25 ENCOUNTER — Emergency Department: Payer: Medicare Other

## 2020-06-25 DIAGNOSIS — Z794 Long term (current) use of insulin: Secondary | ICD-10-CM | POA: Insufficient documentation

## 2020-06-25 DIAGNOSIS — Z20822 Contact with and (suspected) exposure to covid-19: Secondary | ICD-10-CM | POA: Diagnosis not present

## 2020-06-25 DIAGNOSIS — N1831 Chronic kidney disease, stage 3a: Secondary | ICD-10-CM

## 2020-06-25 DIAGNOSIS — Z66 Do not resuscitate: Secondary | ICD-10-CM | POA: Insufficient documentation

## 2020-06-25 DIAGNOSIS — I2510 Atherosclerotic heart disease of native coronary artery without angina pectoris: Secondary | ICD-10-CM | POA: Diagnosis not present

## 2020-06-25 DIAGNOSIS — Z7984 Long term (current) use of oral hypoglycemic drugs: Secondary | ICD-10-CM | POA: Diagnosis not present

## 2020-06-25 DIAGNOSIS — G9341 Metabolic encephalopathy: Secondary | ICD-10-CM | POA: Insufficient documentation

## 2020-06-25 DIAGNOSIS — R0602 Shortness of breath: Secondary | ICD-10-CM

## 2020-06-25 DIAGNOSIS — Z89512 Acquired absence of left leg below knee: Secondary | ICD-10-CM | POA: Insufficient documentation

## 2020-06-25 DIAGNOSIS — G40309 Generalized idiopathic epilepsy and epileptic syndromes, not intractable, without status epilepticus: Secondary | ICD-10-CM | POA: Diagnosis not present

## 2020-06-25 DIAGNOSIS — E1122 Type 2 diabetes mellitus with diabetic chronic kidney disease: Secondary | ICD-10-CM | POA: Insufficient documentation

## 2020-06-25 DIAGNOSIS — Z7901 Long term (current) use of anticoagulants: Secondary | ICD-10-CM | POA: Diagnosis not present

## 2020-06-25 DIAGNOSIS — I10 Essential (primary) hypertension: Secondary | ICD-10-CM | POA: Diagnosis present

## 2020-06-25 DIAGNOSIS — Z89511 Acquired absence of right leg below knee: Secondary | ICD-10-CM

## 2020-06-25 DIAGNOSIS — I130 Hypertensive heart and chronic kidney disease with heart failure and stage 1 through stage 4 chronic kidney disease, or unspecified chronic kidney disease: Secondary | ICD-10-CM | POA: Insufficient documentation

## 2020-06-25 DIAGNOSIS — I4891 Unspecified atrial fibrillation: Secondary | ICD-10-CM | POA: Diagnosis not present

## 2020-06-25 DIAGNOSIS — T40601A Poisoning by unspecified narcotics, accidental (unintentional), initial encounter: Secondary | ICD-10-CM

## 2020-06-25 DIAGNOSIS — G40909 Epilepsy, unspecified, not intractable, without status epilepticus: Secondary | ICD-10-CM

## 2020-06-25 DIAGNOSIS — I5032 Chronic diastolic (congestive) heart failure: Secondary | ICD-10-CM | POA: Insufficient documentation

## 2020-06-25 DIAGNOSIS — R001 Bradycardia, unspecified: Secondary | ICD-10-CM

## 2020-06-25 DIAGNOSIS — Z7982 Long term (current) use of aspirin: Secondary | ICD-10-CM | POA: Diagnosis not present

## 2020-06-25 DIAGNOSIS — Z87891 Personal history of nicotine dependence: Secondary | ICD-10-CM | POA: Insufficient documentation

## 2020-06-25 DIAGNOSIS — E119 Type 2 diabetes mellitus without complications: Secondary | ICD-10-CM

## 2020-06-25 DIAGNOSIS — T402X1A Poisoning by other opioids, accidental (unintentional), initial encounter: Secondary | ICD-10-CM | POA: Diagnosis present

## 2020-06-25 DIAGNOSIS — I482 Chronic atrial fibrillation, unspecified: Secondary | ICD-10-CM | POA: Diagnosis present

## 2020-06-25 DIAGNOSIS — I739 Peripheral vascular disease, unspecified: Secondary | ICD-10-CM | POA: Insufficient documentation

## 2020-06-25 DIAGNOSIS — R4182 Altered mental status, unspecified: Secondary | ICD-10-CM | POA: Insufficient documentation

## 2020-06-25 DIAGNOSIS — E118 Type 2 diabetes mellitus with unspecified complications: Secondary | ICD-10-CM

## 2020-06-25 LAB — COMPREHENSIVE METABOLIC PANEL
ALT: 23 U/L (ref 0–44)
AST: 28 U/L (ref 15–41)
Albumin: 3.3 g/dL — ABNORMAL LOW (ref 3.5–5.0)
Alkaline Phosphatase: 86 U/L (ref 38–126)
Anion gap: 9 (ref 5–15)
BUN: 42 mg/dL — ABNORMAL HIGH (ref 8–23)
CO2: 25 mmol/L (ref 22–32)
Calcium: 8.6 mg/dL — ABNORMAL LOW (ref 8.9–10.3)
Chloride: 108 mmol/L (ref 98–111)
Creatinine, Ser: 1.45 mg/dL — ABNORMAL HIGH (ref 0.61–1.24)
GFR, Estimated: 48 mL/min — ABNORMAL LOW (ref >60)
Glucose, Bld: 222 mg/dL — ABNORMAL HIGH (ref 70–99)
Potassium: 4.3 mmol/L (ref 3.5–5.1)
Sodium: 142 mmol/L (ref 135–145)
Total Bilirubin: 0.7 mg/dL (ref 0.3–1.2)
Total Protein: 6.6 g/dL (ref 6.5–8.1)

## 2020-06-25 LAB — BLOOD GAS, VENOUS
Acid-Base Excess: 4.5 mmol/L — ABNORMAL HIGH (ref 0.0–2.0)
Bicarbonate: 31.5 mmol/L — ABNORMAL HIGH (ref 20.0–28.0)
O2 Saturation: 44 %
Patient temperature: 37
pCO2, Ven: 57 mmHg (ref 44.0–60.0)
pH, Ven: 7.35 (ref 7.250–7.430)
pO2, Ven: <31 mmHg — CL (ref 32.0–45.0)

## 2020-06-25 LAB — CBC WITH DIFFERENTIAL/PLATELET
Abs Immature Granulocytes: 0.04 10*3/uL (ref 0.00–0.07)
Basophils Absolute: 0.1 10*3/uL (ref 0.0–0.1)
Basophils Relative: 1 %
Eosinophils Absolute: 0.5 10*3/uL (ref 0.0–0.5)
Eosinophils Relative: 5 %
HCT: 38.5 % — ABNORMAL LOW (ref 39.0–52.0)
Hemoglobin: 12.6 g/dL — ABNORMAL LOW (ref 13.0–17.0)
Immature Granulocytes: 1 %
Lymphocytes Relative: 21 %
Lymphs Abs: 1.9 10*3/uL (ref 0.7–4.0)
MCH: 29.9 pg (ref 26.0–34.0)
MCHC: 32.7 g/dL (ref 30.0–36.0)
MCV: 91.4 fL (ref 80.0–100.0)
Monocytes Absolute: 0.9 10*3/uL (ref 0.1–1.0)
Monocytes Relative: 10 %
Neutro Abs: 5.6 10*3/uL (ref 1.7–7.7)
Neutrophils Relative %: 62 %
Platelets: 185 10*3/uL (ref 150–400)
RBC: 4.21 MIL/uL — ABNORMAL LOW (ref 4.22–5.81)
RDW: 13.9 % (ref 11.5–15.5)
WBC: 8.8 10*3/uL (ref 4.0–10.5)
nRBC: 0 % (ref 0.0–0.2)

## 2020-06-25 LAB — TROPONIN I (HIGH SENSITIVITY)
Troponin I (High Sensitivity): 65 ng/L — ABNORMAL HIGH (ref <18)
Troponin I (High Sensitivity): 65 ng/L — ABNORMAL HIGH (ref <18)

## 2020-06-25 LAB — CBG MONITORING, ED: Glucose-Capillary: 113 mg/dL — ABNORMAL HIGH (ref 70–99)

## 2020-06-25 LAB — ETHANOL: Alcohol, Ethyl (B): <10 mg/dL (ref <10)

## 2020-06-25 LAB — SARS CORONAVIRUS 2 BY RT PCR (HOSPITAL ORDER, PERFORMED IN ~~LOC~~ HOSPITAL LAB): SARS Coronavirus 2: NEGATIVE

## 2020-06-25 LAB — VALPROIC ACID LEVEL: Valproic Acid Lvl: <10 ug/mL — ABNORMAL LOW (ref 50.0–100.0)

## 2020-06-25 MED ORDER — INSULIN ASPART 100 UNIT/ML ~~LOC~~ SOLN: 0.0000 [IU] | Freq: Three times a day (TID) | SUBCUTANEOUS | Status: DC

## 2020-06-25 MED ORDER — NALOXONE HCL 2 MG/2ML IJ SOSY
0.4000 mg | PREFILLED_SYRINGE | Freq: Once | INTRAMUSCULAR | Status: AC
  Administered 2020-06-25: 0.4 mg via INTRAVENOUS
  Filled 2020-06-25: qty 2

## 2020-06-25 MED ORDER — ACETAMINOPHEN 325 MG PO TABS: 650.0000 mg | ORAL_TABLET | Freq: Four times a day (QID) | ORAL | Status: DC | PRN

## 2020-06-25 MED ORDER — ONDANSETRON HCL 4 MG/2ML IJ SOLN: 4.0000 mg | Freq: Four times a day (QID) | INTRAMUSCULAR | Status: DC | PRN

## 2020-06-25 MED ORDER — CHLORTHALIDONE 25 MG PO TABS
25.0000 mg | ORAL_TABLET | Freq: Every day | ORAL | Status: DC
  Administered 2020-06-26: 25 mg via ORAL
  Filled 2020-06-25 (×3): qty 1

## 2020-06-25 MED ORDER — SODIUM CHLORIDE 0.9 % IV SOLN: 75.0000 mL/h | INTRAVENOUS | Status: DC

## 2020-06-25 MED ORDER — DIVALPROEX SODIUM 500 MG PO DR TAB
500.0000 mg | DELAYED_RELEASE_TABLET | Freq: Three times a day (TID) | ORAL | Status: DC
  Administered 2020-06-26 (×2): 500 mg via ORAL
  Filled 2020-06-25 (×4): qty 1

## 2020-06-25 MED ORDER — RIVAROXABAN 15 MG PO TABS
15.0000 mg | ORAL_TABLET | Freq: Every day | ORAL | Status: DC
  Administered 2020-06-26: 15 mg via ORAL
  Filled 2020-06-25: qty 1

## 2020-06-25 MED ORDER — INSULIN ASPART 100 UNIT/ML ~~LOC~~ SOLN: 0.0000 [IU] | Freq: Every day | SUBCUTANEOUS | Status: DC

## 2020-06-25 MED ORDER — SODIUM CHLORIDE 0.9% FLUSH
3.0000 mL | Freq: Two times a day (BID) | INTRAVENOUS | Status: DC
  Administered 2020-06-26: 3 mL via INTRAVENOUS

## 2020-06-25 MED ORDER — LORAZEPAM 2 MG/ML IJ SOLN: 1.0000 mg | INTRAMUSCULAR | Status: DC | PRN

## 2020-06-25 MED ORDER — SODIUM CHLORIDE 0.9 % IV SOLN: INTRAVENOUS | Status: AC

## 2020-06-25 MED ORDER — ONDANSETRON HCL 4 MG PO TABS: 4.0000 mg | ORAL_TABLET | Freq: Four times a day (QID) | ORAL | Status: DC | PRN

## 2020-06-25 MED ORDER — ACETAMINOPHEN 650 MG RE SUPP: 650.0000 mg | Freq: Four times a day (QID) | RECTAL | Status: DC | PRN

## 2020-06-25 NOTE — ED Provider Notes (Signed)
3:31 PM Assumed care for off going team.   Blood pressure (!) 137/98, pulse (!) 135, temperature 97.8 F (36.6 C), temperature source Oral, resp. rate 16, height 5\' 7"  (1.702 m), weight 97.5 kg, SpO2 91 %.  See their HPI for full report but in brief    Cardiac marker is similar to prior at 26.  He has had chronic atrial elevation secondary to his CKD.  Nothing this represents a cardiac event.  4:07 PM reevaluated patient.  Pupils are no longer pinpoint in nature however patient still very somnolent.  He is currently on 2 L of oxygen.  5:32 PM patient remains somnolent.  We can consider giving more Narcan but given he is on chronic opioids he would suffer significant withdrawal.  There is a clear report from EMS of what happened I think that we should get a CT scan to make sure is no signs of intercranial hemorrhage given his continued altered mental status  7:39 PM ET scan was negative.  On repeat evaluation patient still very difficult to awaken.  He will occasionally mumble.  We will try a little bit of Narcan given in 0.04 increments to prevent significant withdrawal  9:49 PM has had no improvement with Narcan.  I did call the son he states that he is not on any opioids he does his pillboxes and the wife also was found on the opioids.  He is not had any changes to his medications.  Does have a history of seizures which she takes Depakote for.  At this point I will add on a VBG, ethanol, drug screen, urinary screen, Covid test the patient will need admission for altered mental status  Admit patient to the hospital team for altered mental status  10:53 PM patient now is woken up.  He denies taking any medications/ drugs/ thc etc. He was shocked when told him that he has been unresponsive for 10 hours.  He denies ever happening before.  Given his prolonged unresponsive.  I do Feel like he needs to be admitted for further work-up and observation but I am reassured that he is released feeling  better now      Vanessa Florence, MD 06/25/20 [phone]

## 2020-06-25 NOTE — ED Notes (Signed)
Pt remains asleep for most of the time. Pt HR ranges from mid 50s- low 60s. When pt wakes up his heart rate increases to the 90s. Pt wakes when called, but then goes right back to sleep.

## 2020-06-25 NOTE — ED Notes (Signed)
Report given to [place], Therapist, sports. This RN was told that she is looking for a telebox, so I have to hold pt down here until she finds one.

## 2020-06-25 NOTE — ED Notes (Signed)
Pt is alert! Pt requested ice water and drank the entire cup. Pt stated "alright dear, I'm going back to sleep."

## 2020-06-25 NOTE — ED Triage Notes (Addendum)
Pt arrived via [facility] from home after a overdose on hydrocodone. Pt was unresponsive on ems arrival. Pt confused but now alert. Hx of DM-cbg 292. 0.5mg  of narcan given by ems. Pt is a bilateral BKA.

## 2020-06-25 NOTE — ED Notes (Signed)
Pt oxygen increased to 2.5L as he sleeps. Saturation was between 87-89% on 1L, O2 sat at 92%.

## 2020-06-25 NOTE — H&P (Signed)
History and Physical    [name] [id number] DOB: December 05, 1934 DOA: 06/25/2020  PCP: [name]   Patient coming from: Home I have personally briefly reviewed patient's old medical records in [place]  Chief Complaint: Altered mental status  HPI: [name] is a 86 y.o. male with medical history significant for DM, HTN, PAD status post left BKA, CAD, diastolic heart failure, CKD stage IIIa, A. fib on Xarelto, as well as history of seizure disorder, who initially presented to the emergency room with concern for overdose of opiates after wife reported patient being less responsive than usual.  Patient was apparently found sitting in the exact place from the night before and was difficult to arouse.  According to EMS he showed immediate response to 0.5 mg of IV Narcan.  History taken from son over the phone who says that his father was in his usual state of health but reported that 3 days ago he had a 24-hour bout of diarrhea after eating a meatball sub that resolved on its own.  He stated that on the night prior to arrival patient appeared very restless and insisted on staying in the wheelchair and not going to bed which is his routine and when the nurse came by in the morning he was found in the wheelchair difficult to arouse, prompting the call to EMS. ED course: On arrival in the emergency room patient was very somnolent, though arousable but not for long enough to provide a history.  He was afebrile, BP 177/110, pulse 57 respirations 20 with O2 sat 95% on room air.  He was initially treated for opiate overdose by EMS and was monitored for respiratory depression.  He was placed on oxygen because of tachypnea, though O2 did not fall below 91. Blood work was for the most part unremarkable, with normal WBC, hemoglobin of 12.6 at baseline.  CMP significant for creatinine of 1.45 which is his baseline.  Troponin 65x2.  EtOH level less than 10, valproic level less  than 10.  Venous blood gas with normal pH and PCO2 of 57.  COVID and flu pending.  Urinalysis and urine drug screen not done. EKG as interpreted by me: Sinus bradycardia with rate of 51 Imaging: Chest x-ray with pulmonary vascular congestion and bibasilar atelectasis and cardiac enlargement.  Head CT with no acute abnormalities.  Patient was monitored for several hours but continued to be intermittently somnolent and was given another small dose of Narcan with no effect.  The ED provider subsequently spoke with son who prepares patient's pillboxes and denied that patient takes any opioids.  Due to intermittent somnolence, hospitalist consulted for admission.  Patient more awake at the time of admission but has little recollection of the events prompting his visit to the emergency room.  He states his wife says he was sleeping a lot.    Review of Systems: Reviewed and negative   Past Medical History:  Diagnosis Date  . Arthritis   . Atrial fibrillation ([place])   . Carcinoma of prostate ([place])   . CHF (congestive heart failure) ([place])   . Chronic kidney disease   . Diabetes mellitus without complication ([place])   . ED (erectile dysfunction)   . Frequent urination   . Hyperlipidemia   . Hypertension   . Moderate mitral insufficiency   . Peripheral vascular disease ([place])   . Pneumonia 04/2016  . Prostate cancer ([place])   . Sleep apnea    OSA--USE C-PAP  . Ulcer  of left foot due to type 2 diabetes mellitus ([place])   . Urinary stress incontinence, male     Past Surgical History:  Procedure Laterality Date  . AMPUTATION Left 01/12/2017   Procedure: AMPUTATION BELOW KNEE;  Surgeon: Algernon Huxley, MD;  Location: [facility] ORS;  Service: General;  Laterality: Left;  . AMPUTATION Right 12/27/2018   Procedure: AMPUTATION BELOW KNEE;  Surgeon: Algernon Huxley, MD;  Location: [facility] ORS;  Service: General;  Laterality: Right;  . APLIGRAFT PLACEMENT Left 10/15/2016   Procedure: APLIGRAFT PLACEMENT;  Surgeon: Albertine Patricia, DPM;  Location: [facility] ORS;  Service: Podiatry;  Laterality: Left;  necrotic ulcer  . CHOLECYSTECTOMY    . COLONOSCOPY WITH PROPOFOL N/A 01/02/2019   Procedure: COLONOSCOPY WITH PROPOFOL;  Surgeon: Lucilla Lame, MD;  Location: [facility] ENDOSCOPY;  Service: Endoscopy;  Laterality: N/A;  . COLONOSCOPY WITH PROPOFOL N/A 01/05/2019   Procedure: COLONOSCOPY WITH PROPOFOL;  Surgeon: Lucilla Lame, MD;  Location: [facility] ENDOSCOPY;  Service: Endoscopy;  Laterality: N/A;  . ESOPHAGOGASTRODUODENOSCOPY (EGD) WITH PROPOFOL N/A 01/02/2019   Procedure: ESOPHAGOGASTRODUODENOSCOPY (EGD) WITH PROPOFOL;  Surgeon: Lucilla Lame, MD;  Location: [facility] ENDOSCOPY;  Service: Endoscopy;  Laterality: N/A;  . EYE SURGERY Bilateral    Cataract Extraction with IOL  . HIP ARTHROPLASTY Right 10/13/2018   Procedure: ARTHROPLASTY BIPOLAR HIP (HEMIARTHROPLASTY);  Surgeon: Earnestine Leys, MD;  Location: [facility] ORS;  Service: Orthopedics;  Laterality: Right;  . IRRIGATION AND DEBRIDEMENT FOOT Left 10/15/2016   Procedure: IRRIGATION AND DEBRIDEMENT FOOT-EXCISIONAL DEBRIDEMENT OF SKIN 3RD, 4TH AND 5TH TOES WITH APPLICATION OF APLIGRAFT;  Surgeon: Albertine Patricia, DPM;  Location: [facility] ORS;  Service: Podiatry;  Laterality: Left;  necrotic,gangrene  . IRRIGATION AND DEBRIDEMENT FOOT Left 12/09/2016   Procedure: IRRIGATION AND DEBRIDEMENT FOOT-MUSCLE/FASCIA, RESECTION OF FOURTH AND FIFTH METATARSAL NECROTIC BONE;  Surgeon: Albertine Patricia, DPM;  Location: [facility] ORS;  Service: Podiatry;  Laterality: Left;  . IRRIGATION AND DEBRIDEMENT FOOT Right 12/23/2018   Procedure: IRRIGATION AND DEBRIDEMENT FOOT and wound vac application;  Surgeon: Samara Deist, DPM;  Location: [facility] ORS;  Service: Podiatry;  Laterality: Right;  . LEFT HEART CATH N/A 06/25/2019   Procedure: Left Heart Cath and Coronary Angiography;  Surgeon: Isaias Cowman, MD;  Location: [place] CV LAB;  Service: Cardiovascular;  Laterality: N/A;  . LEFT HEART CATH AND CORONARY  ANGIOGRAPHY N/A 06/05/2018   Procedure: LEFT HEART CATH AND CORONARY ANGIOGRAPHY;  Surgeon: Yolonda Kida, MD;  Location: [place] [facility] LAB;  Service: Cardiovascular;  Laterality: N/A;  . LOWER EXTREMITY ANGIOGRAPHY Left 08/16/2016   Procedure: Lower Extremity Angiography;  Surgeon: Algernon Huxley, MD;  Location: [place] [facility];  Service: Cardiovascular;  Laterality: Left;  . LOWER EXTREMITY ANGIOGRAPHY Left 09/01/2016   Procedure: Lower Extremity Angiography;  Surgeon: Algernon Huxley, MD;  Location: [place] [facility] LAB;  Service: Cardiovascular;  Laterality: Left;  . LOWER EXTREMITY ANGIOGRAPHY Left 10/14/2016   Procedure: Lower Extremity Angiography;  Surgeon: Algernon Huxley, MD;  Location: [place] [facility] LAB;  Service: Cardiovascular;  Laterality: Left;  . LOWER EXTREMITY ANGIOGRAPHY Left 12/20/2016   Procedure: Lower Extremity Angiography;  Surgeon: Algernon Huxley, MD;  Location: [place] [facility] LAB;  Service: Cardiovascular;  Laterality: Left;  . LOWER EXTREMITY ANGIOGRAPHY Right 12/18/2018   Procedure: LOWER EXTREMITY ANGIOGRAPHY;  Surgeon: Algernon Huxley, MD;  Location: [place] [facility] LAB;  Service: Cardiovascular;  Laterality: Right;  . LOWER EXTREMITY INTERVENTION  09/01/2016   Procedure: Lower Extremity Intervention;  Surgeon:  Algernon Huxley, MD;  Location: [place] [facility] LAB;  Service: Cardiovascular;;  . PROSTATE SURGERY     removal  . TONSILLECTOMY     as a child     reports that he quit smoking about 25 years ago. His smoking use included cigarettes. He smoked 0.25 packs per day. He has never used smokeless tobacco. He reports that he does not drink alcohol and does not use drugs.  Allergies  Allergen Reactions  . Contrast Media [Iodinated Diagnostic Agents] Shortness Of Breath    SOB  . Iohexol Shortness Of Breath     Desc: Respiratory Distress, Laryngedema, diaphoresis, Onset Date: [id number]   . Metrizamide Shortness Of Breath    SOB SOB SOB   . Latex Rash     Family History  Problem Relation Age of Onset  . Hypertension Mother   . Diabetes Mother   . Prostate cancer Neg Hx   . Bladder Cancer Neg Hx   . Kidney disease Neg Hx       Prior to Admission medications   Medication Sig Start Date End Date Taking? Authorizing Provider  aspirin EC 81 MG tablet Take 81 mg by mouth daily. Swallow whole.    [provider]  atorvastatin (LIPITOR) 80 MG tablet Take 80 mg by mouth at bedtime.    [provider]  bisacodyl (DULCOLAX) 5 MG EC tablet Take 1 tablet (5 mg total) by mouth daily as needed for moderate constipation. 04/10/20   Lorella Nimrod, MD  chlorthalidone (HYGROTON) 25 MG tablet Take 25 mg by mouth daily.    [provider]  diltiazem (DILACOR XR) 120 MG 24 hr capsule Take 120 mg by mouth daily.    [provider]  divalproex (DEPAKOTE) 500 MG DR tablet Take 500 mg by mouth daily.  02/18/20   [provider]  Ensure Max Protein (ENSURE MAX PROTEIN) LIQD Take 330 mLs (11 oz total) by mouth 2 (two) times daily. 04/10/20   Lorella Nimrod, MD  ferrous sulfate 325 (65 FE) MG tablet Take 325 mg by mouth daily with breakfast.    [provider]  gabapentin (NEURONTIN) 300 MG capsule Take 300 mg by mouth 3 (three) times daily.  02/07/19   [provider]  Insulin Glargine (BASAGLAR KWIKPEN) 100 UNIT/ML Inject 28 Units into the skin at bedtime. 04/08/20   [provider]  isosorbide mononitrate (IMDUR) 30 MG 24 hr tablet Take 60 mg by mouth daily. 11/07/19   [provider]  KLOR-CON M20 20 MEQ tablet Take 20 mEq by mouth 2 (two) times daily. 02/18/20   [provider]  metFORMIN (GLUCOPHAGE) 500 MG tablet Take 500 mg by mouth 2 (two) times daily with a meal.    [provider]  metoprolol succinate (TOPROL-XL) 25 MG 24 hr tablet Take 1 tablet (25 mg total) by mouth daily. Take with or immediately following a meal. 04/10/20   Lorella Nimrod, MD  NOVOLOG  FLEXPEN 100 UNIT/ML FlexPen Inject 4 Units into the skin 3 (three) times daily with meals. 04/08/20   [provider]  Rivaroxaban (XARELTO) 15 MG TABS tablet Take 1 tablet (15 mg total) by mouth daily with supper. Hold Xarelto for 3 days.  Start on January 09, 2019. 01/09/19   [name], MD    Physical Exam: Vitals:   06/25/20 2030 06/25/20 2040 06/25/20 2048 06/25/20 2200  BP: 138/63  138/63 (!) 197/89  Pulse:  60 60 69  Resp: 16 18 18  Marland Kitchen)  21  Temp:      TempSrc:      SpO2: 91% 93% 93% 91%  Weight:      Height:         Vitals:   06/25/20 2030 06/25/20 2040 06/25/20 2048 06/25/20 2200  BP: 138/63  138/63 (!) 197/89  Pulse:  60 60 69  Resp: 16 18 18  (!) 21  Temp:      TempSrc:      SpO2: 91% 93% 93% 91%  Weight:      Height:          Constitutional:  Somnolent but easily arousable and will stay awake.  And oriented x 3 . Not in any apparent distress HEENT:      Head: Normocephalic and atraumatic.         Eyes: PERLA, EOMI, Conjunctivae are normal. Sclera is non-icteric.       Mouth/Throat: Mucous membranes are moist.       Neck: Supple with no signs of meningismus. Cardiovascular: Regular rate and rhythm. No murmurs, gallops, or rubs. 2+ symmetrical distal pulses are present . No JVD. No LE edema Respiratory: Respiratory effort normal .Lungs sounds clear bilaterally. No wheezes, crackles, or rhonchi.  Gastrointestinal: Soft, non tender, and non distended with positive bowel sounds.  Genitourinary: No CVA tenderness. Musculoskeletal:  Bilateral BKA. No cyanosis, or erythema of extremities. Neurologic:  Face is symmetric. Moving all extremities. No gross focal neurologic deficits . Skin: Skin is warm, dry.  No rash or ulcers Psychiatric: Mood and affect are normal    Labs on Admission: I have personally reviewed following labs and imaging studies  CBC: Recent Labs  Lab 06/25/20 1235  WBC 8.8  NEUTROABS 5.6  HGB 12.6*  HCT 38.5*  MCV 91.4  PLT [id number]    Basic Metabolic Panel: Recent Labs  Lab 06/25/20 1235  NA 142  K 4.3  CL 108  CO2 25  GLUCOSE 222*  BUN 42*  CREATININE 1.45*  CALCIUM 8.6*   GFR: Estimated Creatinine Clearance: 43 mL/min (A) (by C-G formula based on SCr of 1.45 mg/dL (H)). Liver Function Tests: Recent Labs  Lab 06/25/20 1235  AST 28  ALT 23  ALKPHOS 86  BILITOT 0.7  PROT 6.6  ALBUMIN 3.3*   No results for input(s): LIPASE, AMYLASE in the last 168 hours. No results for input(s): AMMONIA in the last 168 hours. Coagulation Profile: No results for input(s): INR, PROTIME in the last 168 hours. Cardiac Enzymes: No results for input(s): CKTOTAL, CKMB, CKMBINDEX, TROPONINI in the last 168 hours. BNP (last 3 results) No results for input(s): PROBNP in the last 8760 hours. HbA1C: No results for input(s): HGBA1C in the last 72 hours. CBG: Recent Labs  Lab 06/25/20 2224  GLUCAP 113*   Lipid Profile: No results for input(s): CHOL, HDL, LDLCALC, TRIG, CHOLHDL, LDLDIRECT in the last 72 hours. Thyroid Function Tests: No results for input(s): TSH, T4TOTAL, FREET4, T3FREE, THYROIDAB in the last 72 hours. Anemia Panel: No results for input(s): VITAMINB12, FOLATE, FERRITIN, TIBC, IRON, RETICCTPCT in the last 72 hours. Urine analysis:    Component Value Date/Time   COLORURINE YELLOW (A) 05/29/2020 0643   APPEARANCEUR CLEAR (A) 05/29/2020 0643   LABSPEC 1.020 05/29/2020 0643   PHURINE 5.0 05/29/2020 [place] 05/29/2020 [place] NEGATIVE 05/29/2020 [place] NEGATIVE 05/29/2020 [place] 05/29/2020 0643   PROTEINUR 30 (A) 05/29/2020 0643   NITRITE NEGATIVE 05/29/2020 [id number]  LEUKOCYTESUR NEGATIVE 05/29/2020 8413    Radiological Exams on Admission: DG Chest 1 View  Result Date: 06/25/2020 CLINICAL DATA:  Altered mental status. Shortness of breath. Accidental overdose. EXAM: CHEST  1 VIEW COMPARISON:  05/28/2018 FINDINGS: Cardiac enlargement. Decreased lung  volumes with asymmetric elevation of right hemidiaphragm. Pulmonary vascular congestion and bibasilar atelectasis. No airspace opacities. IMPRESSION: Cardiac enlargement, pulmonary vascular congestion and bibasilar atelectasis. Electronically Signed   By: Kerby Moors M.D.   On: 06/25/2020 15:30   CT Head Wo Contrast  Result Date: 06/25/2020 CLINICAL DATA:  Mental status change. EXAM: CT HEAD WITHOUT CONTRAST TECHNIQUE: Contiguous axial images were obtained from the base of the skull through the vertex without intravenous contrast. COMPARISON:  04/08/2020 FINDINGS: Brain: No evidence of acute infarction, hemorrhage, hydrocephalus, extra-axial collection or mass lesion/mass effect. There is mild diffuse low-attenuation within the subcortical and periventricular white matter compatible with chronic microvascular disease. Prominence of the sulci and ventricles compatible with age related brain atrophy. Vascular: No hyperdense vessel or unexpected calcification. Skull: Normal. Negative for fracture or focal lesion. Sinuses/Orbits: No acute finding. Other: None IMPRESSION: 1. No acute intracranial abnormalities. 2. Chronic small vessel ischemic change and brain atrophy. Electronically Signed   By: Kerby Moors M.D.   On: 06/25/2020 18:22     Assessment/Plan 86 year old with history of DM, HTN, PAD status post left BKA, CAD, diastolic heart failure, CKD stage IIIa, A. fib on Xarelto, as well as history of seizure disorder, presenting with altered mental status with initial concern for overdose of opiates     Acute metabolic encephalopathy (improved) -Patient was usually alert and fairly independent presenting with increased somnolence, not acting himself, for the past several hours, preceded by an episode of restlessness and 3 days prior by a bout of diarrhea -Blood work for the most part unremarkable.  Chest x-ray clear.  CT head with no acute intracranial findings -Follow urinalysis to assess for UTI.   Son states he has had 2 UTIs in the past 6 months but WBC is normal.  Patient afebrile, not tachycardic, low suspicion for sepsis -Follow urine drug screen to assess for opiates, though son denies any recent opiate use -Subtherapeutic Depakote level.  Last seizure was a year prior and valproic acid level less than 10 so this is among the differentials -Continuous cardiac monitoring in view of bradycardia -EEG to assess for seizures -Neurology consult    Sinus bradycardia on ECG -Continuous cardiac monitoring -Patient noted to be on both diltiazem and metoprolol.  Will hold both for now  Elevated troponin Coronary artery disease -Troponin 65x2 so low suspicion for ACS -Continue aspirin and atorvastatin.  Holding metoprolol due to sinus bradycardia -An echocardiogram was ordered.  History of UTIs -Follow urinalysis    Type 2 diabetes mellitus treated with insulin (HCC) -Sliding scale insulin coverage    Essential hypertension -Continue home meds, chlorthalidone, but hold diltiazem and metoprolol due to bradycardia    PAD (peripheral artery disease) (HCC) -Continue aspirin and atorvastatin    Atrial fibrillation, chronic (HCC)   Chronic anticoagulation -Continue Xarelto    Chronic diastolic heart failure (HCC) -Continue chlorthalidone.  Hold metoprolol due to sinus bradycardia    S/P bilateral BKA (below knee amputation) (HCC) -Increase nursing assistance for transfer -Trapeze if desired    Seizure disorder (HCC) -Continue Depakote and gabapentin    CAD (coronary artery disease) -Continue aspirin, atorvastatin.  Holding metoprolol due to bradycardia    Chronic kidney disease, stage 3a (HCC) -At baseline  DVT prophylaxis: Xarelto Code Status: DNR, as discussed with son Family Communication: Son, over phone Disposition Plan: Back to previous home environment Consults called: none  Status:At the time of admission, it appears that the appropriate admission status for  this patient is INPATIENT. This is judged to be reasonable and necessary in order to provide the required intensity of service to ensure the patient's safety given the presenting symptoms, physical exam findings, and initial radiographic and laboratory data in the context of their  Comorbid conditions.   Patient requires inpatient status due to high intensity of service, high risk for further deterioration and high frequency of surveillance required.   I certify that at the point of admission it is my clinical judgment that the patient will require inpatient hospital care spanning beyond [facility]     06/25/2020, 11:01 PM

## 2020-06-25 NOTE — ED Provider Notes (Signed)
[facility] Emergency Department Provider Note   ____________________________________________   Event Date/Time   First MD Initiated Contact with Patient 06/25/20 1232     (approximate)  I have reviewed the triage vital signs and the nursing notes.   HISTORY  Chief Complaint Drug Overdose    HPI [name] is a 86 y.o. male with past medical history of hypertension, hyperlipidemia, diabetes, CKD, CHF, and atrial fibrillation on Xarelto who presents to the ED for overdose.  EMS was initially called to patient's home by his wife, who stated that he was less responsive than usual.  Blood glucose noted to be 292 and patient found by EMS to have pinpoint pupils with respiratory depression.  He was subsequently given 0.5 mg of Narcan IV with immediate response.  He became more alert with appropriate respiratory rate.  He now appears somnolent but is answering questions appropriately, denies any pain or other complaints.  He does admit to taking pain medication regularly, but is unable to state what he takes it for.  Med list provided by EMS shows both hydrocodone and tramadol.        Past Medical History:  Diagnosis Date  . Arthritis   . Atrial fibrillation ([place])   . Carcinoma of prostate ([place])   . CHF (congestive heart failure) ([place])   . Chronic kidney disease   . Diabetes mellitus without complication ([place])   . ED (erectile dysfunction)   . Frequent urination   . Hyperlipidemia   . Hypertension   . Moderate mitral insufficiency   . Peripheral vascular disease ([place])   . Pneumonia 04/2016  . Prostate cancer ([place])   . Sleep apnea    OSA--USE C-PAP  . Ulcer of left foot due to type 2 diabetes mellitus ([place])   . Urinary stress incontinence, male     Patient Active Problem List   Diagnosis Date Noted  . AMS (altered mental status) 04/08/2020  . CAD (coronary artery disease) 03/06/2020  . Personal history of COVID-19 03/06/2020  . Acute  metabolic encephalopathy [id number]  . UTI (urinary tract infection) 03/06/2020  . (HFpEF) heart failure with preserved ejection fraction ([place]) 03/06/2020  . Goals of care, counseling/discussion   . Palliative care by specialist   . Seizures ([place]) 11/30/2019  . ACS (acute coronary syndrome) ([place]) 06/24/2019  . Bradycardia 05/16/2019  . Paroxysmal A-fib ([place]) 03/27/2019  . Hx of BKA, right ([place]) 03/02/2019  . Severe protein-calorie malnutrition ([place]) 02/16/2019  . Acute systolic CHF (congestive heart failure) ([place]) 02/16/2019  . Acute respiratory failure with hypoxia ([place]) 02/08/2019  . Pneumonia due to COVID-19 virus 02/08/2019  . Below-knee amputation of right lower extremity ([place]) 01/15/2019  . Polyp of descending colon   . Iron deficiency anemia due to chronic blood loss   . Sacral decubitus ulcer, stage II ([place]) 12/26/2018  . Acute respiratory failure ([place]) 12/20/2018  . Femur fracture ([place]) 10/13/2018  . Chest pain 06/03/2018  . NSTEMI (non-ST elevated myocardial infarction) ([place]) 01/16/2018  . Acute CHF (congestive heart failure) ([place]) 11/23/2017  . Atrial fibrillation with rapid ventricular response ([place]) 10/15/2017  . BKA stump complication ([place]) 0000000  . 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 ([place]) 02/10/2017  . S/P BKA (below knee amputation) unilateral, left ([place]) 02/01/2017  . Chronic diastolic heart failure ([place]) 01/21/2017  . Pressure injury of skin 01/18/2017  . Atherosclerosis of artery of extremity with gangrene ([place]) 01/05/2017  . Diabetic foot ulcer ([place]) 12/29/2016  .  Ataxia 10/21/2016  . Atherosclerosis of native arteries of the extremities with ulceration ([place]) 10/12/2016  . History of femoral angiogram 09/06/2016  . Left leg pain 09/06/2016  . Leukocytosis 09/06/2016  . Generalized weakness 09/06/2016  . AKI (acute kidney injury) ([place]) 09/06/2016  . Atrial fibrillation, chronic ([place]) 08/30/2016  . Pneumonia 05/19/2016  .  Hyperlipidemia 04/20/2016  . Back pain 04/19/2016  . Type 2 diabetes mellitus treated with insulin ([place]) 04/19/2016  . Essential hypertension 04/19/2016  . PAD (peripheral artery disease) ([place]) 04/19/2016  . Erectile dysfunction following radical prostatectomy 06/25/2015  . History of prostate cancer 12/23/2014  . Incontinence 12/23/2014    Past Surgical History:  Procedure Laterality Date  . AMPUTATION Left 01/12/2017   Procedure: AMPUTATION BELOW KNEE;  Surgeon: Algernon Huxley, MD;  Location: [facility] ORS;  Service: General;  Laterality: Left;  . AMPUTATION Right 12/27/2018   Procedure: AMPUTATION BELOW KNEE;  Surgeon: Algernon Huxley, MD;  Location: [facility] ORS;  Service: General;  Laterality: Right;  . APLIGRAFT PLACEMENT Left 10/15/2016   Procedure: APLIGRAFT PLACEMENT;  Surgeon: Albertine Patricia, DPM;  Location: [facility] ORS;  Service: Podiatry;  Laterality: Left;  necrotic ulcer  . CHOLECYSTECTOMY    . COLONOSCOPY WITH PROPOFOL N/A 01/02/2019   Procedure: COLONOSCOPY WITH PROPOFOL;  Surgeon: Lucilla Lame, MD;  Location[facility] ENDOSCOPY;  Service: Endoscopy;  Laterality: N/A;  . COLONOSCOPY WITH PROPOFOL N/A 01/05/2019   Procedure: COLONOSCOPY WITH PROPOFOL;  Surgeon: Lucilla Lame, MD;  Location: [facility] ENDOSCOPY;  Service: Endoscopy;  Laterality: N/A;  . ESOPHAGOGASTRODUODENOSCOPY (EGD) WITH PROPOFOL N/A 01/02/2019   Procedure: ESOPHAGOGASTRODUODENOSCOPY (EGD) WITH PROPOFOL;  Surgeon: Lucilla Lame, MD;  Location: [facility] ENDOSCOPY;  Service: Endoscopy;  Laterality: N/A;  . EYE SURGERY Bilateral    Cataract Extraction with IOL  . HIP ARTHROPLASTY Right 10/13/2018   Procedure: ARTHROPLASTY BIPOLAR HIP (HEMIARTHROPLASTY);  Surgeon: Earnestine Leys, MD;  Location: [facility] ORS;  Service: Orthopedics;  Laterality: Right;  . IRRIGATION AND DEBRIDEMENT FOOT Left 10/15/2016   Procedure: IRRIGATION AND DEBRIDEMENT FOOT-EXCISIONAL DEBRIDEMENT OF SKIN 3RD, 4TH AND 5TH TOES WITH APPLICATION OF APLIGRAFT;  Surgeon: Albertine Patricia, DPM;  Location: [facility] ORS;  Service: Podiatry;  Laterality: Left;  necrotic,gangrene  . IRRIGATION AND DEBRIDEMENT FOOT Left 12/09/2016   Procedure: IRRIGATION AND DEBRIDEMENT FOOT-MUSCLE/FASCIA, RESECTION OF FOURTH AND FIFTH METATARSAL NECROTIC BONE;  Surgeon: Albertine Patricia, DPM;  Location: [facility] ORS;  Service: Podiatry;  Laterality: Left;  . IRRIGATION AND DEBRIDEMENT FOOT Right 12/23/2018   Procedure: IRRIGATION AND DEBRIDEMENT FOOT and wound vac application;  Surgeon: Samara Deist, DPM;  Location: [facility] ORS;  Service: Podiatry;  Laterality: Right;  . LEFT HEART CATH N/A 06/25/2019   Procedure: Left Heart Cath and Coronary Angiography;  Surgeon: Isaias Cowman, MD;  Location: [place] CV LAB;  Service: Cardiovascular;  Laterality: N/A;  . LEFT HEART CATH AND CORONARY ANGIOGRAPHY N/A 06/05/2018   Procedure: LEFT HEART CATH AND CORONARY ANGIOGRAPHY;  Surgeon: Yolonda Kida, MD;  Location: [place] CV LAB;  Service: Cardiovascular;  Laterality: N/A;  . LOWER EXTREMITY ANGIOGRAPHY Left 08/16/2016   Procedure: Lower Extremity Angiography;  Surgeon: Algernon Huxley, MD;  Location: [place] CV LAB;  Service: Cardiovascular;  Laterality: Left;  . LOWER EXTREMITY ANGIOGRAPHY Left 09/01/2016   Procedure: Lower Extremity Angiography;  Surgeon: Algernon Huxley, MD;  Location: [place] CV LAB;  Service: Cardiovascular;  Laterality: Left;  . LOWER EXTREMITY ANGIOGRAPHY Left 10/14/2016   Procedure: Lower Extremity Angiography;  Surgeon: Algernon Huxley,  MD;  Location: [place] CV LAB;  Service: Cardiovascular;  Laterality: Left;  . LOWER EXTREMITY ANGIOGRAPHY Left 12/20/2016   Procedure: Lower Extremity Angiography;  Surgeon: Algernon Huxley, MD;  Location: [place] CV LAB;  Service: Cardiovascular;  Laterality: Left;  . LOWER EXTREMITY ANGIOGRAPHY Right 12/18/2018   Procedure: LOWER EXTREMITY ANGIOGRAPHY;  Surgeon: Algernon Huxley, MD;  Location: [place] CV LAB;  Service:  Cardiovascular;  Laterality: Right;  . LOWER EXTREMITY INTERVENTION  09/01/2016   Procedure: Lower Extremity Intervention;  Surgeon: Algernon Huxley, MD;  Location: [place] CV LAB;  Service: Cardiovascular;;  . PROSTATE SURGERY     removal  . TONSILLECTOMY     as a child    Prior to Admission medications   Medication Sig Start Date End Date Taking? Authorizing Provider  aspirin EC 81 MG tablet Take 81 mg by mouth daily. Swallow whole.    [provider]  atorvastatin (LIPITOR) 80 MG tablet Take 80 mg by mouth at bedtime.    [provider]  bisacodyl (DULCOLAX) 5 MG EC tablet Take 1 tablet (5 mg total) by mouth daily as needed for moderate constipation. 04/10/20   Lorella Nimrod, MD  chlorthalidone (HYGROTON) 25 MG tablet Take 25 mg by mouth daily.    [provider]  diltiazem (DILACOR XR) 120 MG 24 hr capsule Take 120 mg by mouth daily.    [provider]  divalproex (DEPAKOTE) 500 MG DR tablet Take 500 mg by mouth daily.  02/18/20   [provider]  Ensure Max Protein (ENSURE MAX PROTEIN) LIQD Take 330 mLs (11 oz total) by mouth 2 (two) times daily. 04/10/20   Lorella Nimrod, MD  ferrous sulfate 325 (65 FE) MG tablet Take 325 mg by mouth daily with breakfast.    [provider]  gabapentin (NEURONTIN) 300 MG capsule Take 300 mg by mouth 3 (three) times daily.  02/07/19   [provider]  Insulin Glargine (BASAGLAR KWIKPEN) 100 UNIT/ML Inject 28 Units into the skin at bedtime. 04/08/20   [provider]  isosorbide mononitrate (IMDUR) 30 MG 24 hr tablet Take 60 mg by mouth daily. 11/07/19   [provider]  KLOR-CON M20 20 MEQ tablet Take 20 mEq by mouth 2 (two) times daily. 02/18/20   [provider]  metFORMIN (GLUCOPHAGE) 500 MG tablet Take 500 mg by mouth 2 (two) times daily with a meal.    [provider]  metoprolol succinate (TOPROL-XL) 25 MG 24 hr tablet Take 1 tablet (25 mg total) by mouth  daily. Take with or immediately following a meal. 04/10/20   Lorella Nimrod, MD  NOVOLOG FLEXPEN 100 UNIT/ML FlexPen Inject 4 Units into the skin 3 (three) times daily with meals. 04/08/20   [provider]  Rivaroxaban (XARELTO) 15 MG TABS tablet Take 1 tablet (15 mg total) by mouth daily with supper. Hold Xarelto for 3 days.  Start on January 09, 2019. 01/09/19   [name], MD    Allergies Contrast media [iodinated diagnostic agents], Iohexol, Metrizamide, and Latex  Family History  Problem Relation Age of Onset  . Hypertension Mother   . Diabetes Mother   . Prostate cancer Neg Hx   . Bladder Cancer Neg Hx   . Kidney disease Neg Hx     Social History Social History   Tobacco Use  . Smoking status: Former Smoker    Packs/day: 0.25    Types: Cigarettes    Quit date: 10/14/1994  Years since quitting: 25.7  . Smokeless tobacco: Never Used  Vaping Use  . Vaping Use: Never used  Substance Use Topics  . Alcohol use: No    Alcohol/week: 0.0 standard drinks  . Drug use: No    Review of Systems  Constitutional: No fever/chills.  Positive for overdose. Eyes: No visual changes. ENT: No sore throat. Cardiovascular: Denies chest pain. Respiratory: Denies shortness of breath. Gastrointestinal: No abdominal pain.  No nausea, no vomiting.  No diarrhea.  No constipation. Genitourinary: Negative for dysuria. Musculoskeletal: Negative for back pain. Skin: Negative for rash. Neurological: Negative for headaches, focal weakness or numbness.  ____________________________________________   PHYSICAL EXAM:  VITAL SIGNS: ED Triage Vitals [06/25/20 1232]  Enc Vitals Group     BP      Pulse Rate (!) 57     Resp 20     Temp 97.8 F (36.6 C)     Temp Source Oral     SpO2 95 %     Weight      Height      Head Circumference      Peak Flow      Pain Score      Pain Loc      Pain Edu?      Excl. in [place]?     Constitutional: Somnolent but arousable to voice, answers  questions appropriately. Eyes: Conjunctivae are normal. Head: Atraumatic. Nose: No congestion/rhinnorhea. Mouth/Throat: Mucous membranes are moist. Neck: Normal ROM Cardiovascular: Normal rate, regular rhythm. Grossly normal heart sounds.  2+ radial pulses bilaterally. Respiratory: Normal respiratory effort.  No retractions. Lungs CTAB. Gastrointestinal: Soft and nontender. No distention. Genitourinary: deferred Musculoskeletal: Status post bilateral BKA, no lower extremity tenderness or edema. Neurologic:  Normal speech and language. No gross focal neurologic deficits are appreciated. Skin:  Skin is warm, dry and intact. No rash noted. Psychiatric: Mood and affect are normal. Speech and behavior are normal.  ____________________________________________   LABS (all labs ordered are listed, but only abnormal results are displayed)  Labs Reviewed  CBC WITH DIFFERENTIAL/PLATELET - Abnormal; Notable for the following components:      Result Value   RBC 4.21 (*)    Hemoglobin 12.6 (*)    HCT 38.5 (*)    All other components within normal limits  COMPREHENSIVE METABOLIC PANEL - Abnormal; Notable for the following components:   Glucose, Bld 222 (*)    BUN 42 (*)    Creatinine, Ser 1.45 (*)    Calcium 8.6 (*)    Albumin 3.3 (*)    GFR, Estimated 48 (*)    All other components within normal limits  VALPROIC ACID LEVEL - Abnormal; Notable for the following components:   Valproic Acid Lvl <10 (*)    All other components within normal limits  TROPONIN I (HIGH SENSITIVITY) - Abnormal; Notable for the following components:   Troponin I (High Sensitivity) 65 (*)    All other components within normal limits  TROPONIN I (HIGH SENSITIVITY) - Abnormal; Notable for the following components:   Troponin I (High Sensitivity) 65 (*)    All other components within normal limits  URINALYSIS, COMPLETE (UACMP) WITH MICROSCOPIC   ____________________________________________  EKG  ED ECG  REPORT I, Blake Divine, the attending physician, personally viewed and interpreted this ECG.   Date: 06/25/2020  EKG Time: 12:33  Rate: 62  Rhythm: atrial fibrillation, rate 62  Axis: Normal  Intervals:none  ST&T Change: None   PROCEDURES  Procedure(s) performed (including Critical Care):  Procedures   ____________________________________________   INITIAL IMPRESSION / ASSESSMENT AND PLAN / ED COURSE       86 year old male with past medical history of hypertension, hyperlipidemia, diabetes, CKD, CHF, and atrial fibrillation on Xarelto who presents for altered mental status.  He was initially minimally responsive with respiratory depression for EMS, noted to have pinpoint pupils and was given 0.5 mg of IV Narcan with immediate response.  Patient now somnolent but easily arousable to voice with no focal neurologic deficits.  He admits to taking pain medication on a regular basis but is not sure how much or when he took it today.  I suspect his altered mental status is due to opiate overdose and we will continue to monitor for any recurrent respiratory depression.  EKG shows atrial fibrillation with controlled rate, labs pending at this time.  Lab work is unremarkable, patient remains somnolent but arousable to voice.  We will continue to monitor until he is back to his baseline mental status, but if he remains altered for prolonged period of time he would require admission.  Patient turned over to oncoming provider for further observation.      ____________________________________________   FINAL CLINICAL IMPRESSION(S) / ED DIAGNOSES  Final diagnoses:  Opiate overdose, accidental or unintentional, initial encounter ([place])  Altered mental status, unspecified altered mental status type     ED Discharge Orders    None       Note:  This document was prepared using Dragon voice recognition software and may include unintentional dictation errors.   Blake Divine,  MD 06/25/20 [phone]

## 2020-06-26 ENCOUNTER — Observation Stay: Payer: Medicare Other

## 2020-06-26 ENCOUNTER — Encounter: Payer: Self-pay | Admitting: Internal Medicine

## 2020-06-26 ENCOUNTER — Other Ambulatory Visit: Payer: Self-pay

## 2020-06-26 DIAGNOSIS — R4182 Altered mental status, unspecified: Secondary | ICD-10-CM

## 2020-06-26 DIAGNOSIS — G9341 Metabolic encephalopathy: Secondary | ICD-10-CM | POA: Diagnosis not present

## 2020-06-26 LAB — CBC
HCT: 40 % (ref 39.0–52.0)
Hemoglobin: 13.3 g/dL (ref 13.0–17.0)
MCH: 30.4 pg (ref 26.0–34.0)
MCHC: 33.3 g/dL (ref 30.0–36.0)
MCV: 91.3 fL (ref 80.0–100.0)
Platelets: 180 10*3/uL (ref 150–400)
RBC: 4.38 MIL/uL (ref 4.22–5.81)
RDW: 13.6 % (ref 11.5–15.5)
WBC: 8.6 10*3/uL (ref 4.0–10.5)
nRBC: 0 % (ref 0.0–0.2)

## 2020-06-26 LAB — URINE DRUG SCREEN, QUALITATIVE (ARMC ONLY)
Amphetamines, Ur Screen: NOT DETECTED
Barbiturates, Ur Screen: NOT DETECTED
Benzodiazepine, Ur Scrn: NOT DETECTED
Cannabinoid 50 Ng, Ur ~~LOC~~: NOT DETECTED
Cocaine Metabolite,Ur ~~LOC~~: NOT DETECTED
MDMA (Ecstasy)Ur Screen: NOT DETECTED
Methadone Scn, Ur: NOT DETECTED
Opiate, Ur Screen: NOT DETECTED
Phencyclidine (PCP) Ur S: NOT DETECTED
Tricyclic, Ur Screen: NOT DETECTED

## 2020-06-26 LAB — BASIC METABOLIC PANEL
Anion gap: 11 (ref 5–15)
BUN: 39 mg/dL — ABNORMAL HIGH (ref 8–23)
CO2: 24 mmol/L (ref 22–32)
Calcium: 8.7 mg/dL — ABNORMAL LOW (ref 8.9–10.3)
Chloride: 106 mmol/L (ref 98–111)
Creatinine, Ser: 1.26 mg/dL — ABNORMAL HIGH (ref 0.61–1.24)
GFR, Estimated: 57 mL/min — ABNORMAL LOW (ref >60)
Glucose, Bld: 119 mg/dL — ABNORMAL HIGH (ref 70–99)
Potassium: 4.3 mmol/L (ref 3.5–5.1)
Sodium: 141 mmol/L (ref 135–145)

## 2020-06-26 LAB — URINALYSIS, COMPLETE (UACMP) WITH MICROSCOPIC
Bacteria, UA: NONE SEEN
Bilirubin Urine: NEGATIVE
Glucose, UA: NEGATIVE mg/dL
Hgb urine dipstick: NEGATIVE
Ketones, ur: NEGATIVE mg/dL
Leukocytes,Ua: NEGATIVE
Nitrite: NEGATIVE
Protein, ur: NEGATIVE mg/dL
Specific Gravity, Urine: 1.017 (ref 1.005–1.030)
Squamous Epithelial / HPF: NONE SEEN (ref 0–5)
pH: 7 (ref 5.0–8.0)

## 2020-06-26 LAB — GLUCOSE, CAPILLARY
Glucose-Capillary: 104 mg/dL — ABNORMAL HIGH (ref 70–99)
Glucose-Capillary: 89 mg/dL (ref 70–99)
Glucose-Capillary: 175 mg/dL — ABNORMAL HIGH (ref 70–99)

## 2020-06-26 LAB — HEMOGLOBIN A1C
Hgb A1c MFr Bld: 7.4 % — ABNORMAL HIGH (ref 4.8–5.6)
Mean Plasma Glucose: 165.68 mg/dL

## 2020-06-26 LAB — VITAMIN B12: Vitamin B-12: 798 pg/mL (ref 180–914)

## 2020-06-26 LAB — TSH: TSH: 1.688 u[IU]/mL (ref 0.350–4.500)

## 2020-06-26 LAB — AMMONIA: Ammonia: 43 umol/L — ABNORMAL HIGH (ref 9–35)

## 2020-06-26 LAB — CBG MONITORING, ED: Glucose-Capillary: 114 mg/dL — ABNORMAL HIGH (ref 70–99)

## 2020-06-26 MED ORDER — HYDRALAZINE HCL 50 MG PO TABS
50.0000 mg | ORAL_TABLET | Freq: Once | ORAL | Status: AC
  Administered 2020-06-26: 50 mg via ORAL
  Filled 2020-06-26: qty 1

## 2020-06-26 MED ORDER — LEVETIRACETAM IN NACL 500 MG/100ML IV SOLN
500.0000 mg | Freq: Two times a day (BID) | INTRAVENOUS | Status: DC
  Filled 2020-06-26 (×2): qty 100

## 2020-06-26 MED ORDER — SODIUM CHLORIDE 0.9 % IV BOLUS
500.0000 mL | Freq: Once | INTRAVENOUS | Status: AC
  Administered 2020-06-26: 500 mL via INTRAVENOUS

## 2020-06-26 NOTE — Discharge Summary (Signed)
Physician Discharge Summary  [name] TSV:[id number] DOB: [birth year] DOA: 06/25/2020  PCP: [facility]  Admit date: 06/25/2020 Discharge date: 06/26/2020  Admitted From: Home Disposition: Home  Recommendations for Outpatient Follow-up:  1. Follow up with PCP in 1-2 weeks 2. Please obtain BMP/CBC in one week 3. Please follow up on the following pending results: None  Home Health: No Equipment/Devices: Bilateral prosthesis Discharge Condition: Stable CODE STATUS: DNR Diet recommendation: Heart Healthy / Carb Modified   Brief/Interim Summary: [name] is a 86 y.o. male with medical history significant for DM, HTN, PAD status post left BKA, CAD, diastolic heart failure, CKD stage IIIa, A. fib on Xarelto, as well as history of seizure disorder, who initially presented to the emergency room with concern for overdose of opiates after wife reported patient being less responsive than usual.  Patient was apparently found sitting in the exact place from the night before and was difficult to arouse.  According to EMS he showed immediate response to 0.5 mg of IV Narcan.  History taken from son over the phone who says that his father was in his usual state of health but reported that 3 days ago he had a 24-hour bout of diarrhea after eating a meatball sub that resolved on its own.  He stated that on the night prior to arrival patient appeared very restless and insisted on staying in the wheelchair and not going to bed which is his routine and when the nurse came by in the morning he was found in the wheelchair difficult to arouse, prompting the call to EMS.  There is some apparent response to Narcan which was repeated with no response.  Per son he is not on any opioids and UDS was negative for opioids too.  COVID-19 PCR was negative.  Neurology was also consulted and he had MRI brain to rule out any acute infarct and it was negative for any acute changes.  EEG was negative for  any epileptiform changes and did show mild encephalopathy. Ammonia levels were mildly elevated at 43 which can happen with Depakote.  Depakote levels were subtherapeutic and he is on a dose just for behavior not for seizures.  May be he is taking gabapentin for his seizure disorder. No witnessed seizure.  No definitive etiology for his increased somnolence.  He is taking 300 mg of gabapentin 3 times a day that can cause increased some somnolence. May be recent diarrhea is causing extra fatigue.  Patient appears to have underlying dementia on evaluation.  Patient appears to be at his baseline next morning.  Labs without any significant abnormality.  And he is being discharged back to his home environment.  He will follow-up with his primary care provider for further recommendations.  Neurology did not make any changes to his Depakote and Neurontin.  Patient was on 2 different brand names for diltiazem we discontinued 1 and advised to take only one what ever he had it at home.  He will continue with rest of his home medications and follow-up with his providers.  Discharge Diagnoses:  Principal Problem:   Acute metabolic encephalopathy Active Problems:   Type 2 diabetes mellitus treated with insulin (HCC)   Essential hypertension   PAD (peripheral artery disease) (HCC)   Atrial fibrillation, chronic (HCC)   Chronic diastolic heart failure (HCC)   S/P bilateral BKA (below knee amputation) (HCC)   Sinus bradycardia on ECG   Seizure disorder (HCC)   CAD (coronary artery disease)  Chronic anticoagulation   Chronic kidney disease, stage 3a [facility])   Discharge Instructions  Discharge Instructions    Diet - low sodium heart healthy   Complete by: As directed    Discharge instructions   Complete by: As directed    It was pleasure taking care of you. There were 2 medications in your list with the name of Dilacor and tiadylt which are practically the same diltiazem, do not take both of them  just take 1 out of that. Continue taking your medications as you were taking it before and follow-up with your primary care provider. Keep yourself well-hydrated   Increase activity slowly   Complete by: As directed      Allergies as of 06/26/2020      Reactions   Contrast Media [iodinated Diagnostic Agents] Shortness Of Breath   SOB   Iohexol Shortness Of Breath    Desc: Respiratory Distress, Laryngedema, diaphoresis, Onset Date: [id number]   Metrizamide Shortness Of Breath   SOB SOB SOB   Latex Rash      Medication List    STOP taking these medications   Tiadylt ER 120 MG 24 hr capsule Generic drug: diltiazem     TAKE these medications   atorvastatin 80 MG tablet Commonly known as: LIPITOR Take 80 mg by mouth at bedtime.   Basaglar KwikPen 100 UNIT/ML Inject 28 Units into the skin at bedtime.   bisacodyl 5 MG EC tablet Commonly known as: DULCOLAX Take 1 tablet (5 mg total) by mouth daily as needed for moderate constipation.   chlorthalidone 25 MG tablet Commonly known as: HYGROTON Take 25 mg by mouth daily.   diltiazem 120 MG 24 hr capsule Commonly known as: DILACOR XR Take 120 mg by mouth daily.   divalproex 500 MG DR tablet Commonly known as: DEPAKOTE Take 500 mg by mouth daily.   Ensure Max Protein Liqd Take 330 mLs (11 oz total) by mouth 2 (two) times daily.   ferrous sulfate 325 (65 FE) MG tablet Take 325 mg by mouth daily with breakfast.   gabapentin 300 MG capsule Commonly known as: NEURONTIN Take 300 mg by mouth 3 (three) times daily.   isosorbide mononitrate 30 MG 24 hr tablet Commonly known as: IMDUR Take 60 mg by mouth daily.   Klor-Con M20 20 MEQ tablet Generic drug: potassium chloride SA Take 20 mEq by mouth 2 (two) times daily.   metFORMIN 500 MG tablet Commonly known as: GLUCOPHAGE Take 500 mg by mouth 2 (two) times daily with a meal.   metoprolol succinate 25 MG 24 hr tablet Commonly known as: TOPROL-XL Take 1 tablet (25 mg  total) by mouth daily. Take with or immediately following a meal.   NovoLOG FlexPen 100 UNIT/ML FlexPen Generic drug: insulin aspart Inject 4 Units into the skin 3 (three) times daily with meals.   Rivaroxaban 15 MG Tabs tablet Commonly known as: XARELTO Take 1 tablet (15 mg total) by mouth daily with supper. Hold Xarelto for 3 days.  Start on January 09, 2019.       Follow-up Information    [place] Schedule an appointment as soon as possible for a visit.   Contact information: [place] [id number] [phone]              Allergies  Allergen Reactions  . Contrast Media [Iodinated Diagnostic Agents] Shortness Of Breath    SOB  . Iohexol Shortness Of Breath     Desc: Respiratory Distress,  Laryngedema, diaphoresis, Onset Date: [id number]   . Metrizamide Shortness Of Breath    SOB SOB SOB   . Latex Rash    Consultations:    Procedures/Studies: EEG  Result Date: 06/26/2020 Lora Havens, MD     06/26/2020  1:07 PM Patient Name: [name] MRN: [id number] Epilepsy Attending: Lora Havens Referring Physician/Provider: Dr Judd Gaudier Date: 06/26/2020 Duration: 35.59 mins Patient history: 86yo M with h/o seizure admitted for ams. EEG to evaluate for seizure Level of alertness: Awake AEDs during EEG study: VPA Technical aspects: This EEG study was done with scalp electrodes positioned according to the 10-20 International system of electrode placement. Electrical activity was acquired at a sampling rate of 500Hz  and reviewed with a high frequency filter of 70Hz  and a low frequency filter of 1Hz . EEG data were recorded continuously and digitally stored. Description: No posterior dominant rhythm was seen. EEG showed continuous generalized 5 to 7 Hz theta as well as intermittent 2-3hz  delta slowing. Physiologic photic driving was not seen during photic stimulation.  Hyperventilationwas not performed.   ABNORMALITY -Continuous slow,  generalized IMPRESSION: This study is suggestive of mild to moderate diffuse encephalopathy, nonspecific etiology. No seizures or epileptiform discharges were seen throughout the recording. Lora Havens   DG Chest 1 View  Result Date: 06/25/2020 CLINICAL DATA:  Altered mental status. Shortness of breath. Accidental overdose. EXAM: CHEST  1 VIEW COMPARISON:  05/28/2018 FINDINGS: Cardiac enlargement. Decreased lung volumes with asymmetric elevation of right hemidiaphragm. Pulmonary vascular congestion and bibasilar atelectasis. No airspace opacities. IMPRESSION: Cardiac enlargement, pulmonary vascular congestion and bibasilar atelectasis. Electronically Signed   By: Kerby Moors M.D.   On: 06/25/2020 15:30   DG Chest 2 View  Result Date: 05/28/2020 CLINICAL DATA:  Altered mental status with decreased oxygen saturation EXAM: CHEST - 2 VIEW COMPARISON:  April 08, 2020 FINDINGS: There is mild bibasilar atelectasis. No edema or airspace opacity. Heart is mildly enlarged with pulmonary vascularity normal. There is aortic atherosclerosis. No adenopathy. No bone lesions. IMPRESSION: Bibasilar atelectasis. No edema or airspace opacity. Stable cardiac prominence. Aortic Atherosclerosis (ICD10-I70.0). Electronically Signed   By: Lowella Grip III M.D.   On: 05/28/2020 19:25   CT Head Wo Contrast  Result Date: 06/25/2020 CLINICAL DATA:  Mental status change. EXAM: CT HEAD WITHOUT CONTRAST TECHNIQUE: Contiguous axial images were obtained from the base of the skull through the vertex without intravenous contrast. COMPARISON:  04/08/2020 FINDINGS: Brain: No evidence of acute infarction, hemorrhage, hydrocephalus, extra-axial collection or mass lesion/mass effect. There is mild diffuse low-attenuation within the subcortical and periventricular white matter compatible with chronic microvascular disease. Prominence of the sulci and ventricles compatible with age related brain atrophy. Vascular: No hyperdense  vessel or unexpected calcification. Skull: Normal. Negative for fracture or focal lesion. Sinuses/Orbits: No acute finding. Other: None IMPRESSION: 1. No acute intracranial abnormalities. 2. Chronic small vessel ischemic change and brain atrophy. Electronically Signed   By: Kerby Moors M.D.   On: 06/25/2020 18:22   MR BRAIN WO CONTRAST  Result Date: 06/26/2020 CLINICAL DATA:  Neuro deficit, acute stroke suspected. EXAM: MRI HEAD WITHOUT CONTRAST TECHNIQUE: Multiplanar, multiecho pulse sequences of the brain and surrounding structures were obtained without intravenous contrast. COMPARISON:  CT head June 25, 2020.  MRI head April 08, 2020. FINDINGS: Brain: No acute infarction, hemorrhage, hydrocephalus, extra-axial collection or mass lesion. Similar small remote lacunar infarct in the right cerebellum. Similar scattered T2/FLAIR hyperintensities within the white matter, most likely related to chronic  microvascular ischemic disease. Similar small foci of susceptibility artifact in the left parietal lobe and along the right atrial appended mole margin, likely related to prior hemorrhages. Moderate diffuse cerebral atrophy with ex vacuo ventricular dilation. Vascular: Major arterial flow voids are maintained at the skull base. Skull and upper cervical spine: Normal marrow signal. Sinuses/Orbits: Clear sinuses. Bilateral native lens replacements. Otherwise unremarkable orbits. Other: No mastoid effusions. IMPRESSION: 1. No acute intracranial abnormality. 2. Similar moderate cerebral atrophy and chronic microvascular ischemic change. 3. 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 Electronically Signed   By: Margaretha Sheffield MD   On: 06/26/2020 14:10     Subjective: Patient was seen today along with neurology.  Seems like he is having underlying dementia as he was mainly oriented to self.  Able to participate in conversation and cooperative.  He was  asking about his processes stating where are my legs.  Denies any pain.  Not sure why he is here.  Discharge Exam: Vitals:   06/26/20 0739 06/26/20 1121  BP: (!) 152/73 (!) 158/68  Pulse: (!) 56 79  Resp: 17 16  Temp: 98.6 F (37 C) 97.9 F (36.6 C)  SpO2: 96% 98%   Vitals:   06/26/20 0257 06/26/20 0511 06/26/20 0739 06/26/20 1121  BP:  (!) 188/64 (!) 152/73 (!) 158/68  Pulse:  (!) 53 (!) 56 79  Resp:  19 17 16   Temp:  97.7 F (36.5 C) 98.6 F (37 C) 97.9 F (36.6 C)  TempSrc:  Oral Oral Oral  SpO2:  100% 96% 98%  Weight: 92.5 kg     Height:        General: Pt is alert, awake, not in acute distress Cardiovascular: RRR, S1/S2 +, no rubs, no gallops Respiratory: CTA bilaterally, no wheezing, no rhonchi Abdominal: Soft, NT, ND, bowel sounds + Extremities: Bilateral BKA  The results of significant diagnostics from this hospitalization (including imaging, microbiology, ancillary and laboratory) are listed below for reference.    Microbiology: Recent Results (from the past 240 hour(s))  SARS Coronavirus 2 by RT PCR (hospital order, performed in [facility] hospital lab) Nasopharyngeal Nasopharyngeal Swab     Status: None   Collection Time: 06/25/20 10:26 PM   Specimen: Nasopharyngeal Swab  Result Value Ref Range Status   SARS Coronavirus 2 NEGATIVE NEGATIVE Final    Comment: (NOTE) SARS-CoV-2 target nucleic acids are NOT DETECTED.  The SARS-CoV-2 RNA is generally detectable in upper and lower respiratory specimens during the acute phase of infection. The lowest concentration of SARS-CoV-2 viral copies this assay can detect is 250 copies / mL. A negative result does not preclude SARS-CoV-2 infection and should not be used as the sole basis for treatment or other patient management decisions.  A negative result may occur with improper specimen collection / handling, submission of specimen other than nasopharyngeal swab, presence of viral mutation(s) within the areas  targeted by this assay, and inadequate number of viral copies (<250 copies / mL). A negative result must be combined with clinical observations, patient history, and epidemiological information.  Fact Sheet for Patients:   StrictlyIdeas.no  Fact Sheet for Healthcare Providers: BankingDealers.co.za  This test is not yet approved or  cleared by the [place] FDA and has been authorized for detection and/or diagnosis of SARS-CoV-2 by FDA under an Emergency Use Authorization (EUA).  This EUA will remain in effect (meaning this test can be used) for the duration of the COVID-19 declaration under  Section 564(b)(1) of the Act, 21 U.S.C. section 360bbb-3(b)(1), unless the authorization is terminated or revoked sooner.  Performed at [facility], [address] [place], [place] [address]      Labs: BNP (last 3 results) Recent Labs    09/16/19 0649 11/30/19 0109  BNP 734.0* [id number]   Basic Metabolic Panel: Recent Labs  Lab 06/25/20 1235 06/26/20 0428  NA 142 141  K 4.3 4.3  CL 108 106  CO2 25 24  GLUCOSE 222* 119*  BUN 42* 39*  CREATININE 1.45* 1.26*  CALCIUM 8.6* 8.7*   Liver Function Tests: Recent Labs  Lab 06/25/20 1235  AST 28  ALT 23  ALKPHOS 86  BILITOT 0.7  PROT 6.6  ALBUMIN 3.3*   No results for input(s): LIPASE, AMYLASE in the last 168 hours. Recent Labs  Lab 06/26/20 1013  AMMONIA 43*   CBC: Recent Labs  Lab 06/25/20 1235 06/26/20 0428  WBC 8.8 8.6  NEUTROABS 5.6  --   HGB 12.6* 13.3  HCT 38.5* 40.0  MCV 91.4 91.3  PLT 185 180   Cardiac Enzymes: No results for input(s): CKTOTAL, CKMB, CKMBINDEX, TROPONINI in the last 168 hours. BNP: Invalid input(s): POCBNP CBG: Recent Labs  Lab 06/25/20 2224 06/26/20 0111 06/26/20 0507 06/26/20 0736  GLUCAP 113* 114* 104* 89   D-Dimer No results for input(s): DDIMER in the last 72 hours. Hgb A1c Recent Labs    06/26/20 0428  HGBA1C  7.4*   Lipid Profile No results for input(s): CHOL, HDL, LDLCALC, TRIG, CHOLHDL, LDLDIRECT in the last 72 hours. Thyroid function studies Recent Labs    06/26/20 1013  TSH 1.688   Anemia work up No results for input(s): VITAMINB12, FOLATE, FERRITIN, TIBC, IRON, RETICCTPCT in the last 72 hours. Urinalysis    Component Value Date/Time   COLORURINE YELLOW (A) 06/25/2020 2143   APPEARANCEUR CLEAR (A) 06/25/2020 2143   LABSPEC 1.017 06/25/2020 2143   PHURINE 7.0 06/25/2020 2143   GLUCOSEU NEGATIVE 06/25/2020 2143   HGBUR NEGATIVE 06/25/2020 2143   BILIRUBINUR NEGATIVE 06/25/2020 2143   KETONESUR NEGATIVE 06/25/2020 2143   PROTEINUR NEGATIVE 06/25/2020 2143   NITRITE NEGATIVE 06/25/2020 2143   LEUKOCYTESUR NEGATIVE 06/25/2020 2143   Sepsis Labs Invalid input(s): PROCALCITONIN,  WBC,  LACTICIDVEN Microbiology Recent Results (from the past 240 hour(s))  SARS Coronavirus 2 by RT PCR (hospital order, performed in [place] hospital lab) Nasopharyngeal Nasopharyngeal Swab     Status: None   Collection Time: 06/25/20 10:26 PM   Specimen: Nasopharyngeal Swab  Result Value Ref Range Status   SARS Coronavirus 2 NEGATIVE NEGATIVE Final    Comment: (NOTE) SARS-CoV-2 target nucleic acids are NOT DETECTED.  The SARS-CoV-2 RNA is generally detectable in upper and lower respiratory specimens during the acute phase of infection. The lowest concentration of SARS-CoV-2 viral copies this assay can detect is 250 copies / mL. A negative result does not preclude SARS-CoV-2 infection and should not be used as the sole basis for treatment or other patient management decisions.  A negative result may occur with improper specimen collection / handling, submission of specimen other than nasopharyngeal swab, presence of viral mutation(s) within the areas targeted by this assay, and inadequate number of viral copies (<250 copies / mL). A negative result must be combined with clinical observations,  patient history, and epidemiological information.  Fact Sheet for Patients:   StrictlyIdeas.no  Fact Sheet for Healthcare Providers: BankingDealers.co.za  This test is not yet approved or  cleared by  the [employer] and has been authorized for detection and/or diagnosis of SARS-CoV-2 by FDA under an Emergency Use Authorization (EUA).  This EUA will remain in effect (meaning this test can be used) for the duration of the COVID-19 declaration under Section 564(b)(1) of the Act, 21 U.S.C. section 360bbb-3(b)(1), unless the authorization is terminated or revoked sooner.  Performed at [facility], [address] [place], [place] [address]     Time coordinating discharge: Over 30 minutes  SIGNED:  Lorella Nimrod, MD  [facility] 06/26/2020, 2:59 PM  If 7PM-7AM, please contact night-coverage [email or web address]  This record has been created using Systems analyst. Errors have been sought and corrected,but may not always be located. Such creation errors do not reflect on the standard of care.

## 2020-06-26 NOTE — ED Notes (Signed)
Pt BP 160/79. Damita Dunnings, MD notified about high BP

## 2020-06-26 NOTE — Progress Notes (Signed)
MEDICATION RELATED CONSULT NOTE - INITIAL   Pharmacy Consult for Drug interactions for AEDs  Indication: seizures   Allergies  Allergen Reactions  . Contrast Media [Iodinated Diagnostic Agents] Shortness Of Breath    SOB  . Iohexol Shortness Of Breath     Desc: Respiratory Distress, Laryngedema, diaphoresis, Onset Date: [id number]   . Metrizamide Shortness Of Breath    SOB SOB SOB   . Latex Rash    Patient Measurements: Height: 5\' 7"  (170.2 cm) Weight: 97.5 kg (215 lb) IBW/kg (Calculated) : 66.1 Adjusted Body Weight:   Vital Signs: BP: 160/79 (02/02 2330) Pulse Rate: 155 (02/02 2330) Intake/Output from previous day: No intake/output data recorded. Intake/Output from this shift: No intake/output data recorded.  Labs: Recent Labs    06/25/20 1235  WBC 8.8  HGB 12.6*  HCT 38.5*  PLT 185  CREATININE 1.45*  ALBUMIN 3.3*  PROT 6.6  AST 28  ALT 23  ALKPHOS 86  BILITOT 0.7   Estimated Creatinine Clearance: 43 mL/min (A) (by C-G formula based on SCr of 1.45 mg/dL (H)).   Microbiology: Recent Results (from the past 720 hour(s))  SARS Coronavirus 2 by RT PCR (hospital order, performed in [facility] hospital lab) Nasopharyngeal Nasopharyngeal Swab     Status: None   Collection Time: 06/25/20 10:26 PM   Specimen: Nasopharyngeal Swab  Result Value Ref Range Status   SARS Coronavirus 2 NEGATIVE NEGATIVE Final    Comment: (NOTE) SARS-CoV-2 target nucleic acids are NOT DETECTED.  The SARS-CoV-2 RNA is generally detectable in upper and lower respiratory specimens during the acute phase of infection. The lowest concentration of SARS-CoV-2 viral copies this assay can detect is 250 copies / mL. A negative result does not preclude SARS-CoV-2 infection and should not be used as the sole basis for treatment or other patient management decisions.  A negative result may occur with improper specimen collection / handling, submission of specimen other than nasopharyngeal  swab, presence of viral mutation(s) within the areas targeted by this assay, and inadequate number of viral copies (<250 copies / mL). A negative result must be combined with clinical observations, patient history, and epidemiological information.  Fact Sheet for Patients:   StrictlyIdeas.no  Fact Sheet for Healthcare Providers: BankingDealers.co.za  This test is not yet approved or  cleared by the [place] FDA and has been authorized for detection and/or diagnosis of SARS-CoV-2 by FDA under an Emergency Use Authorization (EUA).  This EUA will remain in effect (meaning this test can be used) for the duration of the COVID-19 declaration under Section 564(b)(1) of the Act, 21 U.S.C. section 360bbb-3(b)(1), unless the authorization is terminated or revoked sooner.  Performed at [facility], [address], [place], [place] [address]     Medical History: Past Medical History:  Diagnosis Date  . Arthritis   . Atrial fibrillation ([place])   . Carcinoma of prostate ([place])   . CHF (congestive heart failure) ([place])   . Chronic kidney disease   . Diabetes mellitus without complication ([place])   . ED (erectile dysfunction)   . Frequent urination   . Hyperlipidemia   . Hypertension   . Moderate mitral insufficiency   . Peripheral vascular disease ([place])   . Pneumonia 04/2016  . Prostate cancer ([place])   . Sleep apnea    OSA--USE C-PAP  . Ulcer of left foot due to type 2 diabetes mellitus ([place])   . Urinary stress incontinence, male     Medications:  Scheduled:  .  chlorthalidone  25 mg Oral Daily  . divalproex  500 mg Oral Q8H  . insulin aspart  0-15 Units Subcutaneous TID WC  . insulin aspart  0-5 Units Subcutaneous QHS  . rivaroxaban  15 mg Oral Daily  . sodium chloride flush  3 mL Intravenous Q12H    Assessment: No major drug interactions exist with current AED's.   Goal of Therapy:    Plan:    Aarin Sparkman  D 06/26/2020,12:34 AM

## 2020-06-26 NOTE — Procedures (Addendum)
Patient Name: [name]  MRN: [id number]  Epilepsy Attending: Lora Havens  Referring Physician/Provider: Dr Judd Gaudier Date: 06/26/2020 Duration: 35.59 mins  Patient history: 86yo M with h/o seizure admitted for ams. EEG to evaluate for seizure  Level of alertness: Awake  AEDs during EEG study: VPA  Technical aspects: This EEG study was done with scalp electrodes positioned according to the 10-20 International system of electrode placement. Electrical activity was acquired at a sampling rate of 500Hz  and reviewed with a high frequency filter of 70Hz  and a low frequency filter of 1Hz . EEG data were recorded continuously and digitally stored.   Description: No posterior dominant rhythm was seen. EEG showed continuous generalized 5 to 7 Hz theta as well as intermittent 2-3hz  delta slowing. Physiologic photic driving was not seen during photic stimulation.  Hyperventilationwas not performed.     ABNORMALITY -Continuous slow, generalized  IMPRESSION: This study is suggestive of mild to moderate diffuse encephalopathy, nonspecific etiology. No seizures or epileptiform discharges were seen throughout the recording.   [name] Barbra Sarks

## 2020-06-26 NOTE — Plan of Care (Signed)
New care plan initiated 

## 2020-06-26 NOTE — Consult Note (Addendum)
Neurology Consultation Reason for Consult: Decreased mental status Referring Physician: Damita Dunnings, H  CC: Altered mental status  History is obtained from: Chart review, son, wife  HPI: [name] is a 86 y.o. male with a history of atrial fibrillation, CHF, diabetes, hyperlipidemia, hypertension who presents with altered mental status.  Initially, overdose was considered given pinpoint pupils and respiratory depression, as well as response to Narcan.  But the response was not durable and he continues to be confused.  He also apparently has a seizure disorder and takes Depakote, but his valproate level was less than 10 on arrival.  His wife reports that he did not miss any doses of his medicine up and at the day of admission.  His son last saw him on Sunday at which point he was acting his normal self.  Per the his wife at baseline, he does sometimes get "mixed up."  He would not know the month normally, but is able to carry on a conversation especially if it is when he is interested in.  He needs significant amounts of help and even prior to this admission, they had already been looking at potentially needing nursing home placement.  Monday night, he came to bed, but did not sleep even a little bit, per the wife.  The day prior to admission, Tuesday, he apparently did not have any major issues though his wife did notice that he was getting "mixed up" potentially slightly more than typical but she is not sure that this is truly unusual.  He decided to sleep in his wheelchair Tuesday night(unusual but not completely out of character), and then when his nursing aide try to get him up the next morning she had difficulty arousing him which is why EMS was activated.   LKW: Sunday    ROS: A 14 point ROS was performed and is negative except as noted in the HPI.   Past Medical History:  Diagnosis Date  . Arthritis   . Atrial fibrillation ([place])   . Carcinoma of prostate ([place])   . CHF  (congestive heart failure) ([place])   . Chronic kidney disease   . Diabetes mellitus without complication ([place])   . ED (erectile dysfunction)   . Frequent urination   . Hyperlipidemia   . Hypertension   . Moderate mitral insufficiency   . Peripheral vascular disease ([place])   . Pneumonia 04/2016  . Prostate cancer ([place])   . Sleep apnea    OSA--USE C-PAP  . Ulcer of left foot due to type 2 diabetes mellitus ([place])   . Urinary stress incontinence, male      Family History  Problem Relation Age of Onset  . Hypertension Mother   . Diabetes Mother   . Prostate cancer Neg Hx   . Bladder Cancer Neg Hx   . Kidney disease Neg Hx      Social History:  reports that he quit smoking about 25 years ago. His smoking use included cigarettes. He smoked 0.25 packs per day. He has never used smokeless tobacco. He reports that he does not drink alcohol and does not use drugs.   Exam: Current vital signs: BP (!) 152/73 (BP Location: Left Arm)   Pulse (!) 56   Temp 98.6 F (37 C) (Oral)   Resp 17   Ht 5\' 7"  (1.702 m)   Wt 92.5 kg   SpO2 96%   BMI 31.94 kg/m  Vital signs in last 24 hours: Temp:  [97.6 F (36.4 C)-98.6 F (  37 C)] 98.6 F (37 C) (02/03 0739) Pulse Rate:  [50-155] 56 (02/03 0739) Resp:  [14-21] 17 (02/03 0739) BP: (128-188)/(63-110) 152/73 (02/03 0739) SpO2:  [82 %-100 %] 96 % (02/03 0739) Weight:  [92.5 kg-97.5 kg] 92.5 kg (02/03 0257)   Physical Exam  Constitutional: Appears well-developed and well-nourished.  Psych: Affect appropriate to situation Eyes: No scleral injection HENT: No OP obstruction MSK: no joint deformities.  Cardiovascular: Normal rate and regular rhythm.  Respiratory: Effort normal, non-labored breathing GI: Soft.  No distension. There is no tenderness.  Skin: WDI  Neuro: Mental Status: Patient is awake, alert, his responses are slow, deliberate.  When I ask him where he is, he initially talks about the ceiling.  After repeated questioning, he  is able to tell me that he is at a hospital.  He is unable to give me the name of the hospital, what town he is in, what month it is or how old he is. Cranial Nerves: II: Visual Fields are full. Pupils are equal, round, and reactive to light.   III,IV, VI: EOMI without ptosis or diploplia.  V: Facial sensation is symmetric to temperature VII: Facial movement is symmetric.  VIII: hearing is intact to voice X: Uvula elevates symmetrically XI: Shoulder shrug is symmetric. XII: tongue is midline without atrophy or fasciculations.  Motor: He does not have any drift, I think he may have a possible mild left motor neglect, but this is not definite. Sensory: His responses to questions about sensation are inconsistent, and I am not certain if he is truly understanding when I try to test double simultaneous stimulation, initially I think he may have some left neglect, but then with at least one response, he does appreciate double simultaneous stimulation. Cerebellar: Does not perform.      I have reviewed labs in epic and the results pertinent to this consultation are: Sodium 141, CO2 24 Creatinine 1.26, BUN 39 VBG did not show hypercarbia, I am uncertain of the validity given shows an SPO2 of 44% though he never had low SPO2 is on monitor  I have reviewed the images obtained: CT head-negative  Impression: 86 year old male with altered mental status in the setting of underlying dementia(by wife's description, not diagnosed).  With his diarrhea last Sunday, this could be as minor as multifactorial delirium due to mild gastroenteritis, sleep disturbance.  With question of medication compliance(Depakote level is undetectable) and history of atrial fibrillation, I would favor getting an MRI to rule out multifocal strokes as an etiology of his mental status change.  Seizure would also be a consideration.  Recommendations: 1) Depakote at home dose 2) ammonia, TSH, B12 3) MRI brain 4) EEG  Roland Rack, MD [facility] [phone]  If 7pm- 7am, please page neurology on call as listed in [place].

## 2020-06-26 NOTE — ED Notes (Signed)
Pt transported to the floor.

## 2020-06-26 NOTE — Progress Notes (Signed)
eeg done °

## 2020-06-27 LAB — URINE CULTURE: Culture: NO GROWTH

## 2020-07-15 ENCOUNTER — Other Ambulatory Visit: Payer: Self-pay

## 2020-07-15 ENCOUNTER — Emergency Department
Admission: EM | Admit: 2020-07-15 | Discharge: 2020-07-15 | Disposition: A | Discharge status: Home / Self Care | Payer: Medicare Other | Attending: Emergency Medicine | Admitting: Emergency Medicine

## 2020-07-15 DIAGNOSIS — R350 Frequency of micturition: Secondary | ICD-10-CM | POA: Diagnosis present

## 2020-07-15 DIAGNOSIS — I482 Chronic atrial fibrillation, unspecified: Secondary | ICD-10-CM | POA: Diagnosis not present

## 2020-07-15 DIAGNOSIS — Z96641 Presence of right artificial hip joint: Secondary | ICD-10-CM | POA: Insufficient documentation

## 2020-07-15 DIAGNOSIS — N1831 Chronic kidney disease, stage 3a: Secondary | ICD-10-CM | POA: Diagnosis not present

## 2020-07-15 DIAGNOSIS — Z794 Long term (current) use of insulin: Secondary | ICD-10-CM | POA: Insufficient documentation

## 2020-07-15 DIAGNOSIS — Z87891 Personal history of nicotine dependence: Secondary | ICD-10-CM | POA: Insufficient documentation

## 2020-07-15 DIAGNOSIS — L97529 Non-pressure chronic ulcer of other part of left foot with unspecified severity: Secondary | ICD-10-CM | POA: Diagnosis not present

## 2020-07-15 DIAGNOSIS — Z8546 Personal history of malignant neoplasm of prostate: Secondary | ICD-10-CM | POA: Insufficient documentation

## 2020-07-15 DIAGNOSIS — I5021 Acute systolic (congestive) heart failure: Secondary | ICD-10-CM | POA: Insufficient documentation

## 2020-07-15 DIAGNOSIS — E1122 Type 2 diabetes mellitus with diabetic chronic kidney disease: Secondary | ICD-10-CM | POA: Insufficient documentation

## 2020-07-15 DIAGNOSIS — E11621 Type 2 diabetes mellitus with foot ulcer: Secondary | ICD-10-CM | POA: Diagnosis not present

## 2020-07-15 DIAGNOSIS — Z89511 Acquired absence of right leg below knee: Secondary | ICD-10-CM | POA: Diagnosis not present

## 2020-07-15 DIAGNOSIS — Z7901 Long term (current) use of anticoagulants: Secondary | ICD-10-CM | POA: Diagnosis not present

## 2020-07-15 DIAGNOSIS — Z79899 Other long term (current) drug therapy: Secondary | ICD-10-CM | POA: Insufficient documentation

## 2020-07-15 DIAGNOSIS — Z8616 Personal history of COVID-19: Secondary | ICD-10-CM | POA: Insufficient documentation

## 2020-07-15 DIAGNOSIS — I2510 Atherosclerotic heart disease of native coronary artery without angina pectoris: Secondary | ICD-10-CM | POA: Diagnosis not present

## 2020-07-15 DIAGNOSIS — Z91040 Latex allergy status: Secondary | ICD-10-CM | POA: Diagnosis not present

## 2020-07-15 DIAGNOSIS — I130 Hypertensive heart and chronic kidney disease with heart failure and stage 1 through stage 4 chronic kidney disease, or unspecified chronic kidney disease: Secondary | ICD-10-CM | POA: Diagnosis not present

## 2020-07-15 DIAGNOSIS — R319 Hematuria, unspecified: Secondary | ICD-10-CM | POA: Insufficient documentation

## 2020-07-15 DIAGNOSIS — Z7984 Long term (current) use of oral hypoglycemic drugs: Secondary | ICD-10-CM | POA: Insufficient documentation

## 2020-07-15 DIAGNOSIS — Z89512 Acquired absence of left leg below knee: Secondary | ICD-10-CM | POA: Diagnosis not present

## 2020-07-15 DIAGNOSIS — Z0000 Encounter for general adult medical examination without abnormal findings: Secondary | ICD-10-CM | POA: Diagnosis not present

## 2020-07-15 LAB — BASIC METABOLIC PANEL
Anion gap: 10 (ref 5–15)
BUN: 42 mg/dL — ABNORMAL HIGH (ref 8–23)
CO2: 26 mmol/L (ref 22–32)
Calcium: 9.2 mg/dL (ref 8.9–10.3)
Chloride: 104 mmol/L (ref 98–111)
Creatinine, Ser: 1.45 mg/dL — ABNORMAL HIGH (ref 0.61–1.24)
GFR, Estimated: 48 mL/min — ABNORMAL LOW (ref >60)
Glucose, Bld: 135 mg/dL — ABNORMAL HIGH (ref 70–99)
Potassium: 4.3 mmol/L (ref 3.5–5.1)
Sodium: 140 mmol/L (ref 135–145)

## 2020-07-15 LAB — CBC
HCT: 41.3 % (ref 39.0–52.0)
Hemoglobin: 13.8 g/dL (ref 13.0–17.0)
MCH: 29.7 pg (ref 26.0–34.0)
MCHC: 33.4 g/dL (ref 30.0–36.0)
MCV: 89 fL (ref 80.0–100.0)
Platelets: 222 10*3/uL (ref 150–400)
RBC: 4.64 MIL/uL (ref 4.22–5.81)
RDW: 14.2 % (ref 11.5–15.5)
WBC: 9 10*3/uL (ref 4.0–10.5)
nRBC: 0 % (ref 0.0–0.2)

## 2020-07-15 LAB — URINALYSIS, COMPLETE (UACMP) WITH MICROSCOPIC
Bacteria, UA: NONE SEEN
Bilirubin Urine: NEGATIVE
Glucose, UA: NEGATIVE mg/dL
Ketones, ur: NEGATIVE mg/dL
Leukocytes,Ua: NEGATIVE
Nitrite: NEGATIVE
Protein, ur: NEGATIVE mg/dL
RBC / HPF: >50 RBC/hpf — ABNORMAL HIGH (ref 0–5)
Specific Gravity, Urine: 1.015 (ref 1.005–1.030)
pH: 6 (ref 5.0–8.0)

## 2020-07-15 MED ORDER — FOSFOMYCIN TROMETHAMINE 3 G PO PACK
3.0000 g | PACK | Freq: Once | ORAL | Status: AC
  Administered 2020-07-15: 3 g via ORAL
  Filled 2020-07-15: qty 3

## 2020-07-15 NOTE — ED Notes (Signed)
Secretary setting up EMS transport.

## 2020-07-15 NOTE — ED Notes (Signed)
ED Provider at bedside. 

## 2020-07-15 NOTE — ED Provider Notes (Signed)
[facility] Emergency Department Provider Note  Time seen: 10:20 AM  I have reviewed the triage vital signs and the nursing notes.   HISTORY  Chief Complaint Dysuria  HPI [name] is a 86 y.o. male with a past medical history of CHF, CKD, diabetes, hypertension, hyperlipidemia, chronic anticoagulation, bilateral lower extremity amputations, presents to the emergency department for possible urinary tract infection.  According to EMS per caregiver patient has had foul-smelling urine over the past several days as well as urinary frequency.  Here the patient denies frequency or dysuria.  Denies fever.  Per EMS per caregiver patient has also had several recent low blood sugars current CBG 153.  Patient denies any chest pain or abdominal pain.  No recent nausea vomiting or diarrhea.  Past Medical History:  Diagnosis Date  . Arthritis   . Atrial fibrillation ([place])   . Carcinoma of prostate ([place])   . CHF (congestive heart failure) ([place])   . Chronic kidney disease   . Diabetes mellitus without complication ([place])   . ED (erectile dysfunction)   . Frequent urination   . Hyperlipidemia   . Hypertension   . Moderate mitral insufficiency   . Peripheral vascular disease ([place])   . Pneumonia 04/2016  . Prostate cancer ([place])   . Sleep apnea    OSA--USE C-PAP  . Ulcer of left foot due to type 2 diabetes mellitus ([place])   . Urinary stress incontinence, male     Patient Active Problem List   Diagnosis Date Noted  . Chronic anticoagulation 06/25/2020  . Chronic kidney disease, stage 3a ([place]) 06/25/2020  . AMS (altered mental status) 04/08/2020  . CAD (coronary artery disease) 03/06/2020  . Personal history of COVID-19 03/06/2020  . Acute metabolic encephalopathy 64/40/3474  . UTI (urinary tract infection) 03/06/2020  . (HFpEF) heart failure with preserved ejection fraction ([place]) 03/06/2020  . Goals of care, counseling/discussion   . Palliative care by  specialist   . Seizure disorder ([place]) 11/30/2019  . ACS (acute coronary syndrome) ([place]) 06/24/2019  . Sinus bradycardia on ECG 05/16/2019  . Paroxysmal A-fib ([place]) 03/27/2019  . Hx of BKA, right ([place]) 03/02/2019  . Severe protein-calorie malnutrition ([place]) 02/16/2019  . Acute systolic CHF (congestive heart failure) ([place]) 02/16/2019  . Acute respiratory failure with hypoxia ([place]) 02/08/2019  . Pneumonia due to COVID-19 virus 02/08/2019  . Below-knee amputation of right lower extremity ([place]) 01/15/2019  . Polyp of descending colon   . Iron deficiency anemia due to chronic blood loss   . Sacral decubitus ulcer, stage II ([place]) 12/26/2018  . Acute respiratory failure ([place]) 12/20/2018  . Femur fracture ([place]) 10/13/2018  . Chest pain 06/03/2018  . NSTEMI (non-ST elevated myocardial infarction) ([place]) 01/16/2018  . Acute CHF (congestive heart failure) ([place]) 11/23/2017  . Atrial fibrillation with rapid ventricular response ([place]) 10/15/2017  . BKA stump complication ([place]) 25/95/6387  . 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 ([place]) 02/10/2017  . S/P bilateral BKA (below knee amputation) ([place]) 02/01/2017  . Chronic diastolic heart failure ([place]) 01/21/2017  . Pressure injury of skin 01/18/2017  . Atherosclerosis of artery of extremity with gangrene ([place]) 01/05/2017  . Diabetic foot ulcer ([place]) 12/29/2016  . Ataxia 10/21/2016  . Atherosclerosis of native arteries of the extremities with ulceration ([place]) 10/12/2016  . History of femoral angiogram 09/06/2016  . Left leg pain 09/06/2016  . Leukocytosis 09/06/2016  . Generalized weakness 09/06/2016  . AKI (acute kidney injury) ([place]) 09/06/2016  .  Atrial fibrillation, chronic ([place]) 08/30/2016  . Pneumonia 05/19/2016  . Hyperlipidemia 04/20/2016  . Back pain 04/19/2016  . Type 2 diabetes mellitus treated with insulin ([place]) 04/19/2016  . Essential hypertension 04/19/2016  . PAD (peripheral artery disease) ([place]) 04/19/2016  .  Erectile dysfunction following radical prostatectomy 06/25/2015  . History of prostate cancer 12/23/2014  . Incontinence 12/23/2014    Past Surgical History:  Procedure Laterality Date  . AMPUTATION Left 01/12/2017   Procedure: AMPUTATION BELOW KNEE;  Surgeon: Algernon Huxley, MD;  Location: [facility] ORS;  Service: General;  Laterality: Left;  . AMPUTATION Right 12/27/2018   Procedure: AMPUTATION BELOW KNEE;  Surgeon: Algernon Huxley, MD;  Location: [facility] ORS;  Service: General;  Laterality: Right;  . APLIGRAFT PLACEMENT Left 10/15/2016   Procedure: APLIGRAFT PLACEMENT;  Surgeon: Albertine Patricia, DPM;  Location: [facility] ORS;  Service: Podiatry;  Laterality: Left;  necrotic ulcer  . CHOLECYSTECTOMY    . COLONOSCOPY WITH PROPOFOL N/A 01/02/2019   Procedure: COLONOSCOPY WITH PROPOFOL;  Surgeon: Lucilla Lame, MD;  Location: [facility] ENDOSCOPY;  Service: Endoscopy;  Laterality: N/A;  . COLONOSCOPY WITH PROPOFOL N/A 01/05/2019   Procedure: COLONOSCOPY WITH PROPOFOL;  Surgeon: Lucilla Lame, MD;  Location: [facility] ENDOSCOPY;  Service: Endoscopy;  Laterality: N/A;  . ESOPHAGOGASTRODUODENOSCOPY (EGD) WITH PROPOFOL N/A 01/02/2019   Procedure: ESOPHAGOGASTRODUODENOSCOPY (EGD) WITH PROPOFOL;  Surgeon: Lucilla Lame, MD;  Location: [facility] ENDOSCOPY;  Service: Endoscopy;  Laterality: N/A;  . EYE SURGERY Bilateral    Cataract Extraction with IOL  . HIP ARTHROPLASTY Right 10/13/2018   Procedure: ARTHROPLASTY BIPOLAR HIP (HEMIARTHROPLASTY);  Surgeon: Earnestine Leys, MD;  Location: [facility] ORS;  Service: Orthopedics;  Laterality: Right;  . IRRIGATION AND DEBRIDEMENT FOOT Left 10/15/2016   Procedure: IRRIGATION AND DEBRIDEMENT FOOT-EXCISIONAL DEBRIDEMENT OF SKIN 3RD, 4TH AND 5TH TOES WITH APPLICATION OF APLIGRAFT;  Surgeon: Albertine Patricia, DPM;  Location: [facility] ORS;  Service: Podiatry;  Laterality: Left;  necrotic,gangrene  . IRRIGATION AND DEBRIDEMENT FOOT Left 12/09/2016   Procedure: IRRIGATION AND DEBRIDEMENT FOOT-MUSCLE/FASCIA, RESECTION  OF FOURTH AND FIFTH METATARSAL NECROTIC BONE;  Surgeon: Albertine Patricia, DPM;  Location: [facility] ORS;  Service: Podiatry;  Laterality: Left;  . IRRIGATION AND DEBRIDEMENT FOOT Right 12/23/2018   Procedure: IRRIGATION AND DEBRIDEMENT FOOT and wound vac application;  Surgeon: Samara Deist, DPM;  Location: [facility] ORS;  Service: Podiatry;  Laterality: Right;  . LEFT HEART CATH N/A 06/25/2019   Procedure: Left Heart Cath and Coronary Angiography;  Surgeon: Isaias Cowman, MD;  Location: [place] CV LAB;  Service: Cardiovascular;  Laterality: N/A;  . LEFT HEART CATH AND CORONARY ANGIOGRAPHY N/A 06/05/2018   Procedure: LEFT HEART CATH AND CORONARY ANGIOGRAPHY;  Surgeon: Yolonda Kida, MD;  Location: [place] CV LAB;  Service: Cardiovascular;  Laterality: N/A;  . LOWER EXTREMITY ANGIOGRAPHY Left 08/16/2016   Procedure: Lower Extremity Angiography;  Surgeon: Algernon Huxley, MD;  Location: [place] LAB;  Service: Cardiovascular;  Laterality: Left;  . LOWER EXTREMITY ANGIOGRAPHY Left 09/01/2016   Procedure: Lower Extremity Angiography;  Surgeon: Algernon Huxley, MD;  Location: [place] CV LAB;  Service: Cardiovascular;  Laterality: Left;  . LOWER EXTREMITY ANGIOGRAPHY Left 10/14/2016   Procedure: Lower Extremity Angiography;  Surgeon: Algernon Huxley, MD;  Location: [place] [facility] LAB;  Service: Cardiovascular;  Laterality: Left;  . LOWER EXTREMITY ANGIOGRAPHY Left 12/20/2016   Procedure: Lower Extremity Angiography;  Surgeon: Algernon Huxley, MD;  Location: [place] CV LAB;  Service: Cardiovascular;  Laterality: Left;  . LOWER  EXTREMITY ANGIOGRAPHY Right 12/18/2018   Procedure: LOWER EXTREMITY ANGIOGRAPHY;  Surgeon: Algernon Huxley, MD;  Location: [place] [facility] LAB;  Service: Cardiovascular;  Laterality: Right;  . LOWER EXTREMITY INTERVENTION  09/01/2016   Procedure: Lower Extremity Intervention;  Surgeon: Algernon Huxley, MD;  Location: [place] [facility];  Service: Cardiovascular;;  .  PROSTATE SURGERY     removal  . TONSILLECTOMY     as a child    Prior to Admission medications   Medication Sig Start Date End Date Taking? Authorizing Provider  atorvastatin (LIPITOR) 80 MG tablet Take 80 mg by mouth at bedtime.    [provider]  bisacodyl (DULCOLAX) 5 MG EC tablet Take 1 tablet (5 mg total) by mouth daily as needed for moderate constipation. 04/10/20   Lorella Nimrod, MD  chlorthalidone (HYGROTON) 25 MG tablet Take 25 mg by mouth daily.    [provider]  diltiazem (DILACOR XR) 120 MG 24 hr capsule Take 120 mg by mouth daily.    [provider]  divalproex (DEPAKOTE) 500 MG DR tablet Take 500 mg by mouth daily.  02/18/20   [provider]  Ensure Max Protein (ENSURE MAX PROTEIN) LIQD Take 330 mLs (11 oz total) by mouth 2 (two) times daily. 04/10/20   Lorella Nimrod, MD  ferrous sulfate 325 (65 FE) MG tablet Take 325 mg by mouth daily with breakfast.    [provider]  gabapentin (NEURONTIN) 300 MG capsule Take 300 mg by mouth 3 (three) times daily.  02/07/19   [provider]  Insulin Glargine (BASAGLAR KWIKPEN) 100 UNIT/ML Inject 28 Units into the skin at bedtime. 04/08/20   [provider]  isosorbide mononitrate (IMDUR) 30 MG 24 hr tablet Take 60 mg by mouth daily. 11/07/19   [provider]  KLOR-CON M20 20 MEQ tablet Take 20 mEq by mouth 2 (two) times daily. 02/18/20   [provider]  metFORMIN (GLUCOPHAGE) 500 MG tablet Take 500 mg by mouth 2 (two) times daily with a meal.    [provider]  metoprolol succinate (TOPROL-XL) 25 MG 24 hr tablet Take 1 tablet (25 mg total) by mouth daily. Take with or immediately following a meal. 04/10/20   Lorella Nimrod, MD  NOVOLOG FLEXPEN 100 UNIT/ML FlexPen Inject 4 Units into the skin 3 (three) times daily with meals. 04/08/20   [provider]  Rivaroxaban (XARELTO) 15 MG TABS tablet Take 1 tablet (15 mg total) by mouth daily with  supper. Hold Xarelto for 3 days.  Start on January 09, 2019. 01/09/19   [name], MD    Allergies  Allergen Reactions  . Contrast Media [Iodinated Diagnostic Agents] Shortness Of Breath    SOB  . Iohexol Shortness Of Breath     Desc: Respiratory Distress, Laryngedema, diaphoresis, Onset Date: [id number]   . Metrizamide Shortness Of Breath    SOB SOB SOB   . Latex Rash    Family History  Problem Relation Age of Onset  . Hypertension Mother   . Diabetes Mother   . Prostate cancer Neg Hx   . Bladder Cancer Neg Hx   . Kidney disease Neg Hx     Social History Social History   Tobacco Use  . Smoking status: Former Smoker    Packs/day: 0.25    Types: Cigarettes    Quit date: 10/14/1994    Years since quitting: 25.7  . Smokeless tobacco: Never Used  Vaping Use  . Vaping Use: Never  used  Substance Use Topics  . Alcohol use: No    Alcohol/week: 0.0 standard drinks  . Drug use: No    Review of Systems Constitutional: Negative for fever. Cardiovascular: Negative for chest pain. Respiratory: Negative for shortness of breath. Gastrointestinal: Negative for abdominal pain, vomiting and diarrhea. Genitourinary: Foul-smelling urine without frequency or dysuria Musculoskeletal: Negative for musculoskeletal complaints Neurological: Negative for headache All other ROS negative  ____________________________________________   PHYSICAL EXAM:  VITAL SIGNS: ED Triage Vitals [07/15/20 1008]  Enc Vitals Group     BP (!) 160/77     Pulse Rate 64     Resp 17     Temp 97.9 F (36.6 C)     Temp Source Oral     SpO2 96 %     Weight 203 lb 14.8 oz (92.5 kg)     Height 5\' 7"  (1.702 m)     Head Circumference      Peak Flow      Pain Score 0     Pain Loc      Pain Edu?      Excl. in [place]?    Constitutional: Alert and oriented. Well appearing and in no distress. Eyes: Normal exam ENT      Head: Normocephalic and atraumatic.      Mouth/Throat: Mucous membranes are  moist. Cardiovascular: Normal rate, regular rhythm.  Respiratory: Normal respiratory effort without tachypnea nor retractions. Breath sounds are clear Gastrointestinal: Soft and nontender. No distention.   Musculoskeletal: Nontender with normal range of motion in all extremities.  Neurologic:  Normal speech and language. No gross focal neurologic deficits  Skin:  Skin is warm, dry and intact.  Psychiatric: Mood and affect are normal.   ____________________________________________    EKG  EKG viewed and interpreted by myself shows a sinus rhythm at 65 bpm with a narrow QRS, normal axis, normal intervals, nonspecific ST changes.  ____________________________________________   INITIAL IMPRESSION / ASSESSMENT AND PLAN / ED COURSE  Pertinent labs & imaging results that were available during my care of the patient were reviewed by me and considered in my medical decision making (see chart for details).   Patient presents to the emergency department for possible urinary tract infection.  Complains of foul-smelling urine over the past several days.  We will check labs, urinalysis.  Overall patient appears well, somewhat hypertensive otherwise reassuring vitals.  Overall reassuring exam.  Calm cooperative and pleasant.  Patient's urinalysis shows red cells but no bacteria.  We will send a urine culture and treat with a one-time dose of fosfomycin as a precaution.  Otherwise patient appears well with labs at baseline.  We will discharge home.  [name] was evaluated in Emergency Department on 07/15/2020 for the symptoms described in the history of present illness. He was evaluated in the context of the global COVID-19 pandemic, which necessitated consideration that the patient might be at risk for infection with the SARS-CoV-2 virus that causes COVID-19. Institutional protocols and algorithms that pertain to the evaluation of patients at risk for COVID-19 are in a state of rapid change  based on information released by regulatory bodies including the CDC and federal and state organizations. These policies and algorithms were followed during the patient's care in the ED.  ____________________________________________   FINAL CLINICAL IMPRESSION(S) / ED DIAGNOSES  Medical evaluation Hematuria   Harvest Dark, MD 07/15/20 1137

## 2020-07-15 NOTE — ED Notes (Signed)
Pt signed printed d/c paperwork.  

## 2020-07-15 NOTE — Discharge Instructions (Addendum)
You have been seen in the emergency department today for medical evaluation.  Your lab work is overall reassuring.  Your urinalysis sample does show small amount of bloody urine culture has been added onto your urine sample to help rule out an infection.  You have been treated with a one-time dose of antibiotics as a precaution, no further antibiotics are required.  Please return to the emergency department for any symptom personally concerning to yourself.

## 2020-07-15 NOTE — ED Triage Notes (Addendum)
Pt to ER via [facility] from home.   Per pt's caregiver pt has had foul smelling urine for several days. Pt denies urinary frequency/ urgency. No fevers at home. Per caregiver, pt has also been waking up with low blood sugars. Ems cbg 153.   Pt has bilateral BKA.

## 2020-07-15 NOTE — ED Notes (Signed)
Provider [place] updating pt.

## 2020-07-16 LAB — URINE CULTURE: Culture: <10000 — AB

## 2020-07-28 ENCOUNTER — Emergency Department: Payer: Medicare Other

## 2020-07-28 ENCOUNTER — Other Ambulatory Visit: Payer: Self-pay

## 2020-07-28 ENCOUNTER — Inpatient Hospital Stay
Admission: EM | Admit: 2020-07-28 | Discharge: 2020-08-04 | DRG: 091 | Disposition: A | Discharge status: Skilled Nursing Facility | Payer: Medicare Other | Attending: Internal Medicine | Admitting: Internal Medicine

## 2020-07-28 ENCOUNTER — Encounter: Payer: Self-pay | Admitting: Emergency Medicine

## 2020-07-28 DIAGNOSIS — Z8249 Family history of ischemic heart disease and other diseases of the circulatory system: Secondary | ICD-10-CM

## 2020-07-28 DIAGNOSIS — Z888 Allergy status to other drugs, medicaments and biological substances status: Secondary | ICD-10-CM

## 2020-07-28 DIAGNOSIS — Z794 Long term (current) use of insulin: Secondary | ICD-10-CM

## 2020-07-28 DIAGNOSIS — G4733 Obstructive sleep apnea (adult) (pediatric): Secondary | ICD-10-CM | POA: Diagnosis present

## 2020-07-28 DIAGNOSIS — Z66 Do not resuscitate: Secondary | ICD-10-CM | POA: Diagnosis present

## 2020-07-28 DIAGNOSIS — E785 Hyperlipidemia, unspecified: Secondary | ICD-10-CM | POA: Diagnosis present

## 2020-07-28 DIAGNOSIS — I482 Chronic atrial fibrillation, unspecified: Secondary | ICD-10-CM | POA: Diagnosis present

## 2020-07-28 DIAGNOSIS — Z79899 Other long term (current) drug therapy: Secondary | ICD-10-CM

## 2020-07-28 DIAGNOSIS — F0390 Unspecified dementia without behavioral disturbance: Secondary | ICD-10-CM | POA: Diagnosis present

## 2020-07-28 DIAGNOSIS — G40909 Epilepsy, unspecified, not intractable, without status epilepticus: Secondary | ICD-10-CM | POA: Diagnosis present

## 2020-07-28 DIAGNOSIS — I2510 Atherosclerotic heart disease of native coronary artery without angina pectoris: Secondary | ICD-10-CM | POA: Diagnosis present

## 2020-07-28 DIAGNOSIS — G9341 Metabolic encephalopathy: Secondary | ICD-10-CM | POA: Diagnosis not present

## 2020-07-28 DIAGNOSIS — R778 Other specified abnormalities of plasma proteins: Secondary | ICD-10-CM

## 2020-07-28 DIAGNOSIS — I480 Paroxysmal atrial fibrillation: Secondary | ICD-10-CM | POA: Diagnosis present

## 2020-07-28 DIAGNOSIS — Z89512 Acquired absence of left leg below knee: Secondary | ICD-10-CM

## 2020-07-28 DIAGNOSIS — N1832 Chronic kidney disease, stage 3b: Secondary | ICD-10-CM | POA: Diagnosis present

## 2020-07-28 DIAGNOSIS — Z91040 Latex allergy status: Secondary | ICD-10-CM

## 2020-07-28 DIAGNOSIS — T40601A Poisoning by unspecified narcotics, accidental (unintentional), initial encounter: Secondary | ICD-10-CM | POA: Diagnosis not present

## 2020-07-28 DIAGNOSIS — G8929 Other chronic pain: Secondary | ICD-10-CM | POA: Diagnosis present

## 2020-07-28 DIAGNOSIS — N1831 Chronic kidney disease, stage 3a: Secondary | ICD-10-CM | POA: Diagnosis present

## 2020-07-28 DIAGNOSIS — R001 Bradycardia, unspecified: Secondary | ICD-10-CM | POA: Diagnosis present

## 2020-07-28 DIAGNOSIS — E872 Acidosis, unspecified: Secondary | ICD-10-CM

## 2020-07-28 DIAGNOSIS — R4182 Altered mental status, unspecified: Secondary | ICD-10-CM | POA: Diagnosis present

## 2020-07-28 DIAGNOSIS — Z7901 Long term (current) use of anticoagulants: Secondary | ICD-10-CM

## 2020-07-28 DIAGNOSIS — Z91041 Radiographic dye allergy status: Secondary | ICD-10-CM

## 2020-07-28 DIAGNOSIS — G928 Other toxic encephalopathy: Secondary | ICD-10-CM | POA: Diagnosis not present

## 2020-07-28 DIAGNOSIS — I248 Other forms of acute ischemic heart disease: Secondary | ICD-10-CM | POA: Diagnosis present

## 2020-07-28 DIAGNOSIS — Z96641 Presence of right artificial hip joint: Secondary | ICD-10-CM | POA: Diagnosis present

## 2020-07-28 DIAGNOSIS — I5030 Unspecified diastolic (congestive) heart failure: Secondary | ICD-10-CM | POA: Diagnosis present

## 2020-07-28 DIAGNOSIS — E1122 Type 2 diabetes mellitus with diabetic chronic kidney disease: Secondary | ICD-10-CM | POA: Diagnosis present

## 2020-07-28 DIAGNOSIS — I5032 Chronic diastolic (congestive) heart failure: Secondary | ICD-10-CM | POA: Diagnosis present

## 2020-07-28 DIAGNOSIS — Z87891 Personal history of nicotine dependence: Secondary | ICD-10-CM

## 2020-07-28 DIAGNOSIS — Z79891 Long term (current) use of opiate analgesic: Secondary | ICD-10-CM

## 2020-07-28 DIAGNOSIS — Z833 Family history of diabetes mellitus: Secondary | ICD-10-CM

## 2020-07-28 DIAGNOSIS — E1151 Type 2 diabetes mellitus with diabetic peripheral angiopathy without gangrene: Secondary | ICD-10-CM | POA: Diagnosis present

## 2020-07-28 DIAGNOSIS — Z8546 Personal history of malignant neoplasm of prostate: Secondary | ICD-10-CM

## 2020-07-28 DIAGNOSIS — R0902 Hypoxemia: Secondary | ICD-10-CM

## 2020-07-28 DIAGNOSIS — J9601 Acute respiratory failure with hypoxia: Secondary | ICD-10-CM | POA: Diagnosis present

## 2020-07-28 DIAGNOSIS — E11649 Type 2 diabetes mellitus with hypoglycemia without coma: Secondary | ICD-10-CM | POA: Diagnosis present

## 2020-07-28 DIAGNOSIS — Z89511 Acquired absence of right leg below knee: Secondary | ICD-10-CM

## 2020-07-28 DIAGNOSIS — G9340 Encephalopathy, unspecified: Secondary | ICD-10-CM | POA: Diagnosis present

## 2020-07-28 DIAGNOSIS — I130 Hypertensive heart and chronic kidney disease with heart failure and stage 1 through stage 4 chronic kidney disease, or unspecified chronic kidney disease: Secondary | ICD-10-CM | POA: Diagnosis present

## 2020-07-28 DIAGNOSIS — Z20822 Contact with and (suspected) exposure to covid-19: Secondary | ICD-10-CM | POA: Diagnosis present

## 2020-07-28 DIAGNOSIS — T40605A Adverse effect of unspecified narcotics, initial encounter: Secondary | ICD-10-CM | POA: Diagnosis present

## 2020-07-28 LAB — LACTIC ACID, PLASMA
Lactic Acid, Venous: 2.2 mmol/L (ref 0.5–1.9)
Lactic Acid, Venous: 2.6 mmol/L (ref 0.5–1.9)

## 2020-07-28 LAB — CBC WITH DIFFERENTIAL/PLATELET
Abs Immature Granulocytes: 0.02 10*3/uL (ref 0.00–0.07)
Basophils Absolute: 0.1 10*3/uL (ref 0.0–0.1)
Basophils Relative: 1 %
Eosinophils Absolute: 0.5 10*3/uL (ref 0.0–0.5)
Eosinophils Relative: 7 %
HCT: 39.5 % (ref 39.0–52.0)
Hemoglobin: 13.3 g/dL (ref 13.0–17.0)
Immature Granulocytes: 0 %
Lymphocytes Relative: 26 %
Lymphs Abs: 1.9 10*3/uL (ref 0.7–4.0)
MCH: 30.2 pg (ref 26.0–34.0)
MCHC: 33.7 g/dL (ref 30.0–36.0)
MCV: 89.8 fL (ref 80.0–100.0)
Monocytes Absolute: 0.9 10*3/uL (ref 0.1–1.0)
Monocytes Relative: 12 %
Neutro Abs: 4.1 10*3/uL (ref 1.7–7.7)
Neutrophils Relative %: 54 %
Platelets: 211 10*3/uL (ref 150–400)
RBC: 4.4 MIL/uL (ref 4.22–5.81)
RDW: 14.3 % (ref 11.5–15.5)
WBC: 7.4 10*3/uL (ref 4.0–10.5)
nRBC: 0 % (ref 0.0–0.2)

## 2020-07-28 LAB — COMPREHENSIVE METABOLIC PANEL
ALT: 23 U/L (ref 0–44)
AST: 40 U/L (ref 15–41)
Albumin: 3.2 g/dL — ABNORMAL LOW (ref 3.5–5.0)
Alkaline Phosphatase: 80 U/L (ref 38–126)
Anion gap: 8 (ref 5–15)
BUN: 40 mg/dL — ABNORMAL HIGH (ref 8–23)
CO2: 25 mmol/L (ref 22–32)
Calcium: 8.5 mg/dL — ABNORMAL LOW (ref 8.9–10.3)
Chloride: 109 mmol/L (ref 98–111)
Creatinine, Ser: 1.62 mg/dL — ABNORMAL HIGH (ref 0.61–1.24)
GFR, Estimated: 42 mL/min — ABNORMAL LOW (ref >60)
Glucose, Bld: 79 mg/dL (ref 70–99)
Potassium: 4.2 mmol/L (ref 3.5–5.1)
Sodium: 142 mmol/L (ref 135–145)
Total Bilirubin: 0.8 mg/dL (ref 0.3–1.2)
Total Protein: 6.5 g/dL (ref 6.5–8.1)

## 2020-07-28 LAB — BLOOD GAS, ARTERIAL
Acid-Base Excess: 2.4 mmol/L — ABNORMAL HIGH (ref 0.0–2.0)
Bicarbonate: 27.2 mmol/L (ref 20.0–28.0)
FIO2: 0.21
O2 Saturation: 92.8 %
Patient temperature: 37
pCO2 arterial: 42 mmHg (ref 32.0–48.0)
pH, Arterial: 7.42 (ref 7.350–7.450)
pO2, Arterial: 65 mmHg — ABNORMAL LOW (ref 83.0–108.0)

## 2020-07-28 LAB — URINALYSIS, COMPLETE (UACMP) WITH MICROSCOPIC
Bacteria, UA: NONE SEEN
Bilirubin Urine: NEGATIVE
Glucose, UA: NEGATIVE mg/dL
Ketones, ur: NEGATIVE mg/dL
Leukocytes,Ua: NEGATIVE
Nitrite: NEGATIVE
Protein, ur: NEGATIVE mg/dL
RBC / HPF: >50 RBC/hpf — ABNORMAL HIGH (ref 0–5)
Specific Gravity, Urine: 1.019 (ref 1.005–1.030)
Squamous Epithelial / HPF: NONE SEEN (ref 0–5)
pH: 5 (ref 5.0–8.0)

## 2020-07-28 LAB — TROPONIN I (HIGH SENSITIVITY)
Troponin I (High Sensitivity): 89 ng/L — ABNORMAL HIGH (ref <18)
Troponin I (High Sensitivity): 81 ng/L — ABNORMAL HIGH (ref <18)

## 2020-07-28 LAB — BRAIN NATRIURETIC PEPTIDE: B Natriuretic Peptide: 385.2 pg/mL — ABNORMAL HIGH (ref 0.0–100.0)

## 2020-07-28 LAB — PROCALCITONIN: Procalcitonin: 0.1 ng/mL

## 2020-07-28 LAB — GLUCOSE, CAPILLARY
Glucose-Capillary: 67 mg/dL — ABNORMAL LOW (ref 70–99)
Glucose-Capillary: 73 mg/dL (ref 70–99)

## 2020-07-28 LAB — MAGNESIUM: Magnesium: 2.2 mg/dL (ref 1.7–2.4)

## 2020-07-28 MED ORDER — GABAPENTIN 300 MG PO CAPS
300.0000 mg | ORAL_CAPSULE | Freq: Three times a day (TID) | ORAL | Status: DC
  Administered 2020-07-28: 300 mg via ORAL
  Filled 2020-07-28: qty 1

## 2020-07-28 MED ORDER — DIVALPROEX SODIUM 500 MG PO DR TAB
500.0000 mg | DELAYED_RELEASE_TABLET | Freq: Every day | ORAL | Status: DC
Start: 2020-07-29 — End: 2020-08-04
  Administered 2020-07-29 – 2020-08-04 (×7): 500 mg via ORAL
  Filled 2020-07-28 (×8): qty 1

## 2020-07-28 MED ORDER — RIVAROXABAN 15 MG PO TABS
15.0000 mg | ORAL_TABLET | Freq: Every day | ORAL | Status: DC
  Administered 2020-07-29 – 2020-08-04 (×7): 15 mg via ORAL
  Filled 2020-07-28 (×7): qty 1

## 2020-07-28 MED ORDER — CHLORTHALIDONE 25 MG PO TABS
25.0000 mg | ORAL_TABLET | Freq: Every day | ORAL | Status: DC
  Administered 2020-07-29 – 2020-08-04 (×7): 25 mg via ORAL
  Filled 2020-07-28 (×8): qty 1

## 2020-07-28 MED ORDER — ONDANSETRON HCL 4 MG PO TABS: 4.0000 mg | ORAL_TABLET | Freq: Four times a day (QID) | ORAL | Status: DC | PRN

## 2020-07-28 MED ORDER — POTASSIUM CHLORIDE CRYS ER 20 MEQ PO TBCR
20.0000 meq | EXTENDED_RELEASE_TABLET | Freq: Two times a day (BID) | ORAL | Status: DC
  Administered 2020-07-29 – 2020-07-31 (×6): 20 meq via ORAL
  Filled 2020-07-28 (×6): qty 1

## 2020-07-28 MED ORDER — INSULIN ASPART 100 UNIT/ML ~~LOC~~ SOLN
0.0000 [IU] | Freq: Three times a day (TID) | SUBCUTANEOUS | Status: DC
  Administered 2020-07-29: 3 [IU] via SUBCUTANEOUS
  Administered 2020-07-29: 5 [IU] via SUBCUTANEOUS
  Administered 2020-07-30 – 2020-07-31 (×4): 3 [IU] via SUBCUTANEOUS
  Administered 2020-07-31: 8 [IU] via SUBCUTANEOUS
  Administered 2020-08-01: 2 [IU] via SUBCUTANEOUS
  Administered 2020-08-01: 3 [IU] via SUBCUTANEOUS
  Administered 2020-08-01 – 2020-08-02 (×3): 2 [IU] via SUBCUTANEOUS
  Administered 2020-08-02 – 2020-08-03 (×2): 5 [IU] via SUBCUTANEOUS
  Administered 2020-08-03: 12:00:00 3 [IU] via SUBCUTANEOUS
  Administered 2020-08-04 (×2): 11 [IU] via SUBCUTANEOUS
  Administered 2020-08-04: 3 [IU] via SUBCUTANEOUS
  Filled 2020-07-28 (×18): qty 1

## 2020-07-28 MED ORDER — ACETAMINOPHEN 325 MG PO TABS: 650.0000 mg | ORAL_TABLET | Freq: Four times a day (QID) | ORAL | Status: DC | PRN

## 2020-07-28 MED ORDER — METOPROLOL SUCCINATE ER 25 MG PO TB24
25.0000 mg | ORAL_TABLET | Freq: Every day | ORAL | Status: DC
  Administered 2020-07-29 – 2020-08-04 (×7): 25 mg via ORAL
  Filled 2020-07-28 (×7): qty 1

## 2020-07-28 MED ORDER — FERROUS SULFATE 325 (65 FE) MG PO TABS
325.0000 mg | ORAL_TABLET | Freq: Every day | ORAL | Status: DC
  Administered 2020-07-29 – 2020-08-04 (×7): 325 mg via ORAL
  Filled 2020-07-28 (×7): qty 1

## 2020-07-28 MED ORDER — INSULIN GLARGINE 100 UNIT/ML ~~LOC~~ SOLN
15.0000 [IU] | Freq: Every day | SUBCUTANEOUS | Status: DC
  Administered 2020-07-29 – 2020-08-03 (×6): 15 [IU] via SUBCUTANEOUS
  Filled 2020-07-28 (×9): qty 0.15

## 2020-07-28 MED ORDER — ONDANSETRON HCL 4 MG/2ML IJ SOLN: 4.0000 mg | Freq: Four times a day (QID) | INTRAMUSCULAR | Status: DC | PRN

## 2020-07-28 MED ORDER — INSULIN ASPART 100 UNIT/ML ~~LOC~~ SOLN: 0.0000 [IU] | Freq: Every day | SUBCUTANEOUS | Status: DC

## 2020-07-28 MED ORDER — ISOSORBIDE MONONITRATE ER 30 MG PO TB24
60.0000 mg | ORAL_TABLET | Freq: Every day | ORAL | Status: DC
  Administered 2020-07-29 – 2020-08-04 (×7): 60 mg via ORAL
  Filled 2020-07-28: qty 1
  Filled 2020-07-28 (×2): qty 2
  Filled 2020-07-28 (×2): qty 1
  Filled 2020-07-28: qty 2
  Filled 2020-07-28: qty 1

## 2020-07-28 MED ORDER — ACETAMINOPHEN 650 MG RE SUPP: 650.0000 mg | Freq: Four times a day (QID) | RECTAL | Status: DC | PRN

## 2020-07-28 MED ORDER — SODIUM CHLORIDE 0.9 % IV SOLN: Freq: Once | INTRAVENOUS | Status: DC

## 2020-07-28 MED ORDER — NALOXONE HCL 0.4 MG/ML IJ SOLN
0.2000 mg | Freq: Once | INTRAMUSCULAR | Status: AC
  Administered 2020-07-28: 0.2 mg via INTRAVENOUS
  Filled 2020-07-28: qty 1

## 2020-07-28 MED ORDER — ATORVASTATIN CALCIUM 20 MG PO TABS
80.0000 mg | ORAL_TABLET | Freq: Every day | ORAL | Status: DC
  Administered 2020-07-28 – 2020-08-03 (×7): 80 mg via ORAL
  Filled 2020-07-28 (×4): qty 1
  Filled 2020-07-28: qty 4
  Filled 2020-07-28: qty 1
  Filled 2020-07-28: qty 4

## 2020-07-28 MED ORDER — LACTATED RINGERS IV BOLUS
500.0000 mL | Freq: Once | INTRAVENOUS | Status: AC
  Administered 2020-07-28: 500 mL via INTRAVENOUS

## 2020-07-28 MED ORDER — FUROSEMIDE 10 MG/ML IJ SOLN
40.0000 mg | Freq: Once | INTRAMUSCULAR | Status: AC
  Administered 2020-07-28: 40 mg via INTRAVENOUS
  Filled 2020-07-28: qty 4

## 2020-07-28 NOTE — ED Notes (Signed)
2nd dose of 0.2 mg Narcan given per D. Tamala Julian, MD.

## 2020-07-28 NOTE — ED Notes (Signed)
Patient opened eyes and more responsive than prior to Narcan.

## 2020-07-28 NOTE — H&P (Signed)
History and Physical    [name] XLK:[id number] DOB: 10/28/1934 DOA: 07/28/2020  PCP: [place]   Patient coming from: Home  I have personally briefly reviewed patient's old medical records in [place]  Chief Complaint: Altered mental status  HPI: [name] is a 86 y.o. male with medical history significant for DM, HTN, PAD status post bilateral BKA, CAD, diastolic heart failure, CKD stage IIIa, paroxysmal A. fib on Xarelto, seizure disorder, history of UTIs, chronic pain on chronic opiates, hospitalized from 2/2-2/3 with altered mental status believed related to unintentional opiate overdose with suspected underlying dementia, and recently seen in the ER on 2/22 with dysuria with urine culture showing less than 10,000 colonies, who was brought to the emergency room with altered mental status, initially showing response to Narcan but with fluctuating mental status after several hours of observation in the emergency room.  History is limited due to persistent confusion.  Patient was apparently found to have decreased responsiveness from his baseline on arrival of his home health nurse and EMS was activated ED course: On arrival, BP 114/72 with heart rate 42 initially, respirations 21 with O2 sat in the mid 90s but dipping to as low as 89% during several hours of observation.  On his blood work, CBC completely within normal limits.  CMP with creatinine 1.62, slightly above baseline of 1.45.  Urinalysis without pyuria.  Troponin elevated but downtrending 89>81.  Procalcitonin 0.1.  Lactic acid uptrending 2.2>2.6.  ABG on room air with PO2 of 65, pH 7.42 and PCO2 42. EKG as interpreted by me: A. fib at 79 with no acute ST-T wave changes Imaging: Chest x-ray with mild CHF CT head nonacute  Patient was monitored for several hours in the ER but continued to have intermittent confusion and somnolence.  Hospitalist consulted for admission.   Review of  Systems: Unreliable due to increased confusion   Past Medical History:  Diagnosis Date  . Arthritis   . Atrial fibrillation ([place])   . Carcinoma of prostate ([place])   . CHF (congestive heart failure) ([place])   . Chronic kidney disease   . Diabetes mellitus without complication ([place])   . ED (erectile dysfunction)   . Frequent urination   . Hyperlipidemia   . Hypertension   . Moderate mitral insufficiency   . Peripheral vascular disease ([place])   . Pneumonia 04/2016  . Prostate cancer ([place])   . Sleep apnea    OSA--USE C-PAP  . Ulcer of left foot due to type 2 diabetes mellitus ([place])   . Urinary stress incontinence, male     Past Surgical History:  Procedure Laterality Date  . AMPUTATION Left 01/12/2017   Procedure: AMPUTATION BELOW KNEE;  Surgeon: Algernon Huxley, MD;  Location: [facility] ORS;  Service: General;  Laterality: Left;  . AMPUTATION Right 12/27/2018   Procedure: AMPUTATION BELOW KNEE;  Surgeon: Algernon Huxley, MD;  Location: [facility] ORS;  Service: General;  Laterality: Right;  . APLIGRAFT PLACEMENT Left 10/15/2016   Procedure: APLIGRAFT PLACEMENT;  Surgeon: Albertine Patricia, DPM;  Location: [facility] ORS;  Service: Podiatry;  Laterality: Left;  necrotic ulcer  . CHOLECYSTECTOMY    . COLONOSCOPY WITH PROPOFOL N/A 01/02/2019   Procedure: COLONOSCOPY WITH PROPOFOL;  Surgeon: Lucilla Lame, MD;  Location: [facility] ENDOSCOPY;  Service: Endoscopy;  Laterality: N/A;  . COLONOSCOPY WITH PROPOFOL N/A 01/05/2019   Procedure: COLONOSCOPY WITH PROPOFOL;  Surgeon: Lucilla Lame, MD;  Location[facility]COPY;  Service: Endoscopy;  Laterality:  N/A;  . ESOPHAGOGASTRODUODENOSCOPY (EGD) WITH PROPOFOL N/A 01/02/2019   Procedure: ESOPHAGOGASTRODUODENOSCOPY (EGD) WITH PROPOFOL;  Surgeon: Lucilla Lame, MD;  Location: [facility] ENDOSCOPY;  Service: Endoscopy;  Laterality: N/A;  . EYE SURGERY Bilateral    Cataract Extraction with IOL  . HIP ARTHROPLASTY Right 10/13/2018   Procedure: ARTHROPLASTY BIPOLAR HIP (HEMIARTHROPLASTY);   Surgeon: Earnestine Leys, MD;  Location: [facility] ORS;  Service: Orthopedics;  Laterality: Right;  . IRRIGATION AND DEBRIDEMENT FOOT Left 10/15/2016   Procedure: IRRIGATION AND DEBRIDEMENT FOOT-EXCISIONAL DEBRIDEMENT OF SKIN 3RD, 4TH AND 5TH TOES WITH APPLICATION OF APLIGRAFT;  Surgeon: Albertine Patricia, DPM;  Location: [facility] ORS;  Service: Podiatry;  Laterality: Left;  necrotic,gangrene  . IRRIGATION AND DEBRIDEMENT FOOT Left 12/09/2016   Procedure: IRRIGATION AND DEBRIDEMENT FOOT-MUSCLE/FASCIA, RESECTION OF FOURTH AND FIFTH METATARSAL NECROTIC BONE;  Surgeon: Albertine Patricia, DPM;  Location: [facility] ORS;  Service: Podiatry;  Laterality: Left;  . IRRIGATION AND DEBRIDEMENT FOOT Right 12/23/2018   Procedure: IRRIGATION AND DEBRIDEMENT FOOT and wound vac application;  Surgeon: Samara Deist, DPM;  Location: [facility] ORS;  Service: Podiatry;  Laterality: Right;  . LEFT HEART CATH N/A 06/25/2019   Procedure: Left Heart Cath and Coronary Angiography;  Surgeon: Isaias Cowman, MD;  Location: [place] CV LAB;  Service: Cardiovascular;  Laterality: N/A;  . LEFT HEART CATH AND CORONARY ANGIOGRAPHY N/A 06/05/2018   Procedure: LEFT HEART CATH AND CORONARY ANGIOGRAPHY;  Surgeon: Yolonda Kida, MD;  Location: [place] CV LAB;  Service: Cardiovascular;  Laterality: N/A;  . LOWER EXTREMITY ANGIOGRAPHY Left 08/16/2016   Procedure: Lower Extremity Angiography;  Surgeon: Algernon Huxley, MD;  Location: [place] [facility];  Service: Cardiovascular;  Laterality: Left;  . LOWER EXTREMITY ANGIOGRAPHY Left 09/01/2016   Procedure: Lower Extremity Angiography;  Surgeon: Algernon Huxley, MD;  Location: [place] [facility];  Service: Cardiovascular;  Laterality: Left;  . LOWER EXTREMITY ANGIOGRAPHY Left 10/14/2016   Procedure: Lower Extremity Angiography;  Surgeon: Algernon Huxley, MD;  Location: [place] [facility] LAB;  Service: Cardiovascular;  Laterality: Left;  . LOWER EXTREMITY ANGIOGRAPHY Left 12/20/2016   Procedure: Lower  Extremity Angiography;  Surgeon: Algernon Huxley, MD;  Location: [place] [facility];  Service: Cardiovascular;  Laterality: Left;  . LOWER EXTREMITY ANGIOGRAPHY Right 12/18/2018   Procedure: LOWER EXTREMITY ANGIOGRAPHY;  Surgeon: Algernon Huxley, MD;  Location: [place] [facility] LAB;  Service: Cardiovascular;  Laterality: Right;  . LOWER EXTREMITY INTERVENTION  09/01/2016   Procedure: Lower Extremity Intervention;  Surgeon: Algernon Huxley, MD;  Location: [place] [facility] LAB;  Service: Cardiovascular;;  . PROSTATE SURGERY     removal  . TONSILLECTOMY     as a child     reports that he quit smoking about 25 years ago. His smoking use included cigarettes. He smoked 0.25 packs per day. He has never used smokeless tobacco. He reports that he does not drink alcohol and does not use drugs.  Allergies  Allergen Reactions  . Contrast Media [Iodinated Diagnostic Agents] Shortness Of Breath    SOB  . Iohexol Shortness Of Breath     Desc: Respiratory Distress, Laryngedema, diaphoresis, Onset Date: [id number]   . Metrizamide Shortness Of Breath    SOB SOB SOB   . Latex Rash    Family History  Problem Relation Age of Onset  . Hypertension Mother   . Diabetes Mother   . Prostate cancer Neg Hx   . Bladder Cancer Neg Hx   . Kidney disease Neg Hx  Prior to Admission medications   Medication Sig Start Date End Date Taking? Authorizing Provider  atorvastatin (LIPITOR) 80 MG tablet Take 80 mg by mouth at bedtime.   Yes [provider]  bisacodyl (DULCOLAX) 5 MG EC tablet Take 1 tablet (5 mg total) by mouth daily as needed for moderate constipation. 04/10/20  Yes Lorella Nimrod, MD  chlorthalidone (HYGROTON) 25 MG tablet Take 25 mg by mouth daily.   Yes [provider]  diltiazem (DILACOR XR) 120 MG 24 hr capsule Take 120 mg by mouth daily.   Yes [provider]  divalproex (DEPAKOTE) 500 MG DR tablet Take 500 mg by mouth daily.  02/18/20  Yes [provider]   ferrous sulfate 325 (65 FE) MG tablet Take 325 mg by mouth daily with breakfast.   Yes [provider]  gabapentin (NEURONTIN) 300 MG capsule Take 300 mg by mouth 3 (three) times daily.  02/07/19  Yes [provider]  Insulin Glargine (BASAGLAR KWIKPEN) 100 UNIT/ML Inject 25 Units into the skin at bedtime. 04/08/20  Yes [provider]  isosorbide mononitrate (IMDUR) 30 MG 24 hr tablet Take 60 mg by mouth daily. 11/07/19  Yes [provider]  KLOR-CON M20 20 MEQ tablet Take 20 mEq by mouth 2 (two) times daily. 02/18/20  Yes [provider]  metFORMIN (GLUCOPHAGE) 500 MG tablet Take 500 mg by mouth 2 (two) times daily with a meal.   Yes [provider]  metoprolol succinate (TOPROL-XL) 25 MG 24 hr tablet Take 1 tablet (25 mg total) by mouth daily. Take with or immediately following a meal. 04/10/20  Yes Lorella Nimrod, MD  NOVOLOG FLEXPEN 100 UNIT/ML FlexPen Inject 4 Units into the skin 3 (three) times daily with meals. 04/08/20  Yes [provider]  Rivaroxaban (XARELTO) 15 MG TABS tablet Take 1 tablet (15 mg total) by mouth daily with supper. Hold Xarelto for 3 days.  Start on January 09, 2019. 01/09/19  Yes [name], MD  Ensure Max Protein (ENSURE MAX PROTEIN) LIQD Take 330 mLs (11 oz total) by mouth 2 (two) times daily. 04/10/20   Lorella Nimrod, MD  TIADYLT ER 120 MG 24 hr capsule Take 120 mg by mouth at bedtime. 07/22/20   [provider]    Physical Exam: Vitals:   07/28/20 1800 07/28/20 1802 07/28/20 1830 07/28/20 1930  BP: 109/71  133/75 140/89  Pulse: 60 (!) 59 72 (!) 58  Resp: 15 18 (!) 22 18  Temp:      TempSrc:      SpO2: (!) 89% 90% 97% 99%  Weight:      Height:         Vitals:   07/28/20 1800 07/28/20 1802 07/28/20 1830 07/28/20 1930  BP: 109/71  133/75 140/89  Pulse: 60 (!) 59 72 (!) 58  Resp: 15 18 (!) 22 18  Temp:      TempSrc:      SpO2: (!) 89% 90% 97% 99%  Weight:      Height:           Constitutional:  Somnolent but easily arousable, oriented x2. Not in any apparent distress HEENT:      Head: Normocephalic and atraumatic.         Eyes: PERLA, EOMI, Conjunctivae are normal. Sclera is non-icteric.       Mouth/Throat: Mucous membranes are moist.       Neck: Supple with no signs of meningismus. Cardiovascular: Regular rate and rhythm. No  murmurs, gallops, or rubs. 2+ symmetrical distal pulses are present . No JVD. No LE edema Respiratory: Respiratory effort normal .Lungs sounds clear bilaterally. No wheezes, crackles, or rhonchi.  Gastrointestinal: Soft, non tender, and non distended with positive bowel sounds.  Genitourinary: No CVA tenderness. Musculoskeletal: Nontender with normal range of motion in all extremities. No cyanosis, or erythema of extremities. Neurologic:  Face is symmetric. Moving all extremities. No gross focal neurologic deficits . Skin: Skin is warm, dry.  No rash or ulcers Psychiatric: Mood and affect are difficult to assess but appear normal   Labs on Admission: I have personally reviewed following labs and imaging studies  CBC: Recent Labs  Lab 07/28/20 1255  WBC 7.4  NEUTROABS 4.1  HGB 13.3  HCT 39.5  MCV 89.8  PLT 419   Basic Metabolic Panel: Recent Labs  Lab 07/28/20 1255  NA 142  K 4.2  CL 109  CO2 25  GLUCOSE 79  BUN 40*  CREATININE 1.62*  CALCIUM 8.5*  MG 2.2   GFR: Estimated Creatinine Clearance: 37.1 mL/min (A) (by C-G formula based on SCr of 1.62 mg/dL (H)). Liver Function Tests: Recent Labs  Lab 07/28/20 1255  AST 40  ALT 23  ALKPHOS 80  BILITOT 0.8  PROT 6.5  ALBUMIN 3.2*   No results for input(s): LIPASE, AMYLASE in the last 168 hours. No results for input(s): AMMONIA in the last 168 hours. Coagulation Profile: No results for input(s): INR, PROTIME in the last 168 hours. Cardiac Enzymes: No results for input(s): CKTOTAL, CKMB, CKMBINDEX, TROPONINI in the last 168 hours. BNP (last 3 results) No  results for input(s): PROBNP in the last 8760 hours. HbA1C: No results for input(s): HGBA1C in the last 72 hours. CBG: No results for input(s): GLUCAP in the last 168 hours. Lipid Profile: No results for input(s): CHOL, HDL, LDLCALC, TRIG, CHOLHDL, LDLDIRECT in the last 72 hours. Thyroid Function Tests: No results for input(s): TSH, T4TOTAL, FREET4, T3FREE, THYROIDAB in the last 72 hours. Anemia Panel: No results for input(s): VITAMINB12, FOLATE, FERRITIN, TIBC, IRON, RETICCTPCT in the last 72 hours. Urine analysis:    Component Value Date/Time   COLORURINE YELLOW (A) 07/28/2020 1255   APPEARANCEUR HAZY (A) 07/28/2020 1255   LABSPEC 1.019 07/28/2020 1255   PHURINE 5.0 07/28/2020 1255   GLUCOSEU NEGATIVE 07/28/2020 1255   HGBUR LARGE (A) 07/28/2020 1255   BILIRUBINUR NEGATIVE 07/28/2020 1255   KETONESUR NEGATIVE 07/28/2020 1255   PROTEINUR NEGATIVE 07/28/2020 1255   NITRITE NEGATIVE 07/28/2020 [place] 07/28/2020 1255    Radiological Exams on Admission: CT Head Wo Contrast  Result Date: 07/28/2020 CLINICAL DATA:  Mental status changes. EXAM: CT HEAD WITHOUT CONTRAST TECHNIQUE: Contiguous axial images were obtained from the base of the skull through the vertex without intravenous contrast. COMPARISON:  MRI 06/26/2020.  CT scan 06/25/2020. FINDINGS: Brain: There is no evidence for acute hemorrhage, hydrocephalus, mass lesion, or abnormal extra-axial fluid collection. No definite CT evidence for acute infarction. Diffuse loss of parenchymal volume is consistent with atrophy. Patchy low attenuation in the deep hemispheric and periventricular white matter is nonspecific, but likely reflects chronic microvascular ischemic demyelination. Vascular: No hyperdense vessel or unexpected calcification. Skull: No evidence for fracture. No worrisome lytic or sclerotic lesion. Sinuses/Orbits: The visualized paranasal sinuses and mastoid air cells are clear. Visualized portions of the  globes and intraorbital fat are unremarkable. Other: None. IMPRESSION: 1. No acute intracranial abnormality. 2. Atrophy with chronic small vessel white matter ischemic disease.  Electronically Signed   By: Misty Stanley M.D.   On: 07/28/2020 13:35   DG Chest Portable 1 View  Result Date: 07/28/2020 CLINICAL DATA:  86 year old with acute mental status changes and open coil bore responsiveness". Current history of CHF, hypertension. EXAM: PORTABLE CHEST 1 VIEW COMPARISON:  06/25/2020 and earlier, including CT chest 11/30/2019. FINDINGS: Cardiac silhouette moderately to markedly enlarged for AP portable technique, unchanged. Pulmonary venous hypertension and likely mild interstitial pulmonary edema. No confluent airspace consolidation. No visible pleural effusions. IMPRESSION: Mild CHF and/or fluid overload is suspected, with stable moderate to marked cardiomegaly. Electronically Signed   By: Evangeline Dakin M.D.   On: 07/28/2020 17:00     Assessment/Plan  86 year old male with history of DM, HTN, PAD status post bilateral BKA, CAD, diastolic heart failure, CKD stageIIIa, paroxysmal A. fib on Xarelto, seizure disorder, history of UTIs, chronic pain on chronic opiates, hospitalized from 2/2-2/3 with altered mental status believed related to unintentional opiate overdose with suspected underlying dementia, and recently seen in the ER on 2/22 with dysuria with urine culture showing less than 10,000 colonies, who was brought to the emergency room with altered mental status.   Acute toxic metabolic encephalopathy related to opiate use Suspect underlying dementia -Presents with decreased responsiveness, responding to Narcan but with intermittent somnolence and confusion, lactic acidosis and hypoxemia believe related to unintentional effects of opiate and therapeutic use -Differential includes seizure, postictal but likely related to opiate given response to Narcan -Sepsis not suspected.  Lactic acidosis  probably related to hypoxemia -CT head unremarkable, urinalysis clear, chest x-ray without infiltrate -Improving -Narcan as needed -Neurologic checks with fall and aspiration precautions -Expectation that patient should be back to baseline in 1 day -Second hospitalization for the same. -Outpatient plans have been made for patient to move to [facility] on 3/8  Hypoxemia -Arterial PO2 of 65 -Likely related to respiratory depression from opiate -Supplemental oxygen -Chest x-ray did show mild pulmonary vascular congestion but patient is not otherwise symptomatic, so suspect main etiology of hypoxemia is related to opiate use   Elevated troponin Coronary artery disease -Troponin 89>81 and is likely related to demand ischemia -Continue aspirin and atorvastatin as well as metoprolol -An  History of UTIs -Patient had recent negative urine culture on 2/22 with less than 10,000 colonies -Urinalysis without pyuria    Chronic diastolic heart failure ([place]) -Hypoxia but without overt shortness of breath and otherwise appears euvolemic -Chest x-ray showing mild congestion -Echo in July 2021 showed EF 50 to 55% -Follow-up BNP -Continue chlorthalidone and metoprolol    Seizure disorder (HCC) -Continue Depakote and gabapentin -EEG 06/26/2020:This study is suggestive of mild to moderate diffuse encephalopathy, nonspecific etiology. No seizures or epileptiform discharges were seen throughout the recording    Chronic kidney disease, stage 3a (HCC) -At baseline    Type 2 diabetes mellitus treated with insulin ([place]) -Sliding scale insulin coverage -Resume home basal insulin    Essential hypertension -Continue home meds, chlorthalidone,  holding diltiazem due to borderline bradycardia on arrival    PAD (peripheral artery disease)   Status post bilateral BKA -Continue aspirin and atorvastatin -Increase nursing assistance with transfers -Trapeze if desired    Atrial fibrillation,  chronic (HCC)   Chronic anticoagulation -Continue Xarelto -Noted to be on 2 rate control agents, diltiazem and metoprolol and was bradycardic to 42 on arrival so consider eliminating 1 rate control agent -Cardiac monitoring   DVT prophylaxis: Xarelto Code Status: DNR, discussed with son [name] Family Communication: Son ,  Marshall Cork.  Discussed plan of care, prognosis and discussed that we will try to get patient to [facility] as the previously arranged as outpatient.  Son was in agreement. Disposition Plan:  To [facility]  consults called: none  Status: Observation  Athena Masse MD [facility]     07/28/2020, 8:12 PM

## 2020-07-28 NOTE — ED Triage Notes (Signed)
Patient arrives via EMS for unresponsiveness. Patient does answer some questions when sternal rubbed.. Patient has a hx of diabetes and UTI.

## 2020-07-28 NOTE — ED Notes (Signed)
Attempted to call patient's son, [name], unable to get answer and unable to leave voicemail at this time.

## 2020-07-28 NOTE — ED Notes (Signed)
Patient glucose 67.

## 2020-07-28 NOTE — ED Notes (Signed)
Lactic Acid 2.2. Charlsie Quest, MD aware.

## 2020-07-28 NOTE — ED Notes (Addendum)
Patient waking up and more alert. Patient has confusion that is not baseline per son.

## 2020-07-28 NOTE — ED Notes (Addendum)
Spoke with patient's son, [name], and gave update. Son states that patient had a episode similar to this before and is unsure if patient's wife might have gave patient's double dose of medications this morning. Patient is suppose to be moving into [facility] (07/29/20). They would like patient transferred there upon discharge from hospital.

## 2020-07-28 NOTE — ED Notes (Signed)
Patient to CT at this time

## 2020-07-28 NOTE — ED Provider Notes (Signed)
[facility] Emergency Department Provider Note ____________________________________________   Event Date/Time   First MD Initiated Contact with Patient 07/28/20 1247     (approximate)  I have reviewed the triage vital signs and the nursing notes.  HISTORY  Chief Complaint No chief complaint on file.   HPI [name] is a 86 y.o. malewho presents to the ED for evaluation of poor responsiveness.  Chart review indicates history of HTN, HLD, DM on insulin and s/p bilateral BKA.  Anticoagulated on Xarelto due to A. fib and PAD.Marland Kitchen  Medical admission 1 month ago for poor responsiveness attributed to accidental overdose of opiates is noted.  Patient presents to the ED via EMS from home due to unresponsiveness.  Home health was at the patient's house for a regularly scheduled visit when they noted decreased responsiveness.  The home health individual was not familiar with the patient and does not know his normal mental status.  Blood glucose was normal in the field, and due to his persistent depressed mental status, EMS was called.  Here in the ED, patient is somnolent and unable to provide any relevant history due to his depressed mental status.  Past Medical History:  Diagnosis Date  . Arthritis   . Atrial fibrillation ([place])   . Carcinoma of prostate ([place])   . CHF (congestive heart failure) ([place])   . Chronic kidney disease   . Diabetes mellitus without complication ([place])   . ED (erectile dysfunction)   . Frequent urination   . Hyperlipidemia   . Hypertension   . Moderate mitral insufficiency   . Peripheral vascular disease ([place])   . Pneumonia 04/2016  . Prostate cancer ([place])   . Sleep apnea    OSA--USE C-PAP  . Ulcer of left foot due to type 2 diabetes mellitus ([place])   . Urinary stress incontinence, male     Patient Active Problem List   Diagnosis Date Noted  . Chronic anticoagulation 06/25/2020  . Chronic kidney disease, stage 3a ([place])  06/25/2020  . AMS (altered mental status) 04/08/2020  . CAD (coronary artery disease) 03/06/2020  . Personal history of COVID-19 03/06/2020  . Acute metabolic encephalopathy 04/54/0981  . UTI (urinary tract infection) 03/06/2020  . (HFpEF) heart failure with preserved ejection fraction ([place]) 03/06/2020  . Goals of care, counseling/discussion   . Palliative care by specialist   . Seizure disorder ([place]) 11/30/2019  . ACS (acute coronary syndrome) ([place]) 06/24/2019  . Sinus bradycardia on ECG 05/16/2019  . Paroxysmal A-fib ([place]) 03/27/2019  . Hx of BKA, right ([place]) 03/02/2019  . Severe protein-calorie malnutrition ([place]) 02/16/2019  . Acute systolic CHF (congestive heart failure) ([place]) 02/16/2019  . Acute respiratory failure with hypoxia ([place]) 02/08/2019  . Pneumonia due to COVID-19 virus 02/08/2019  . Below-knee amputation of right lower extremity ([place]) 01/15/2019  . Polyp of descending colon   . Iron deficiency anemia due to chronic blood loss   . Sacral decubitus ulcer, stage II ([place]) 12/26/2018  . Acute respiratory failure ([place]) 12/20/2018  . Femur fracture ([place]) 10/13/2018  . Chest pain 06/03/2018  . NSTEMI (non-ST elevated myocardial infarction) ([place]) 01/16/2018  . Acute CHF (congestive heart failure) ([place]) 11/23/2017  . Atrial fibrillation with rapid ventricular response ([place]) 10/15/2017  . BKA stump complication ([place]) 19/14/7829  . 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 ([place]) 02/10/2017  . S/P bilateral BKA (below knee amputation) ([place]) 02/01/2017  . Chronic diastolic heart failure ([place]) 01/21/2017  . Pressure injury of  skin 01/18/2017  . Atherosclerosis of artery of extremity with gangrene ([place]) 01/05/2017  . Diabetic foot ulcer ([place]) 12/29/2016  . Ataxia 10/21/2016  . Atherosclerosis of native arteries of the extremities with ulceration ([place]) 10/12/2016  . History of femoral angiogram 09/06/2016  . Left leg pain 09/06/2016  . Leukocytosis 09/06/2016  .  Generalized weakness 09/06/2016  . AKI (acute kidney injury) ([place]) 09/06/2016  . Atrial fibrillation, chronic ([place]) 08/30/2016  . Pneumonia 05/19/2016  . Hyperlipidemia 04/20/2016  . Back pain 04/19/2016  . Type 2 diabetes mellitus treated with insulin ([place]) 04/19/2016  . Essential hypertension 04/19/2016  . PAD (peripheral artery disease) ([place]) 04/19/2016  . Erectile dysfunction following radical prostatectomy 06/25/2015  . History of prostate cancer 12/23/2014  . Incontinence 12/23/2014    Past Surgical History:  Procedure Laterality Date  . AMPUTATION Left 01/12/2017   Procedure: AMPUTATION BELOW KNEE;  Surgeon: Algernon Huxley, MD;  Location: [facility] ORS;  Service: General;  Laterality: Left;  . AMPUTATION Right 12/27/2018   Procedure: AMPUTATION BELOW KNEE;  Surgeon: Algernon Huxley, MD;  Location: [facility] ORS;  Service: General;  Laterality: Right;  . APLIGRAFT PLACEMENT Left 10/15/2016   Procedure: APLIGRAFT PLACEMENT;  Surgeon: Albertine Patricia, DPM;  Location: [facility] ORS;  Service: Podiatry;  Laterality: Left;  necrotic ulcer  . CHOLECYSTECTOMY    . COLONOSCOPY WITH PROPOFOL N/A 01/02/2019   Procedure: COLONOSCOPY WITH PROPOFOL;  Surgeon: Lucilla Lame, MD;  Location: [facility] ENDOSCOPY;  Service: Endoscopy;  Laterality: N/A;  . COLONOSCOPY WITH PROPOFOL N/A 01/05/2019   Procedure: COLONOSCOPY WITH PROPOFOL;  Surgeon: Lucilla Lame, MD;  Location[facility] ENDOSCOPY;  Service: Endoscopy;  Laterality: N/A;  . ESOPHAGOGASTRODUODENOSCOPY (EGD) WITH PROPOFOL N/A 01/02/2019   Procedure: ESOPHAGOGASTRODUODENOSCOPY (EGD) WITH PROPOFOL;  Surgeon: Lucilla Lame, MD;  Location: [facility] ENDOSCOPY;  Service: Endoscopy;  Laterality: N/A;  . EYE SURGERY Bilateral    Cataract Extraction with IOL  . HIP ARTHROPLASTY Right 10/13/2018   Procedure: ARTHROPLASTY BIPOLAR HIP (HEMIARTHROPLASTY);  Surgeon: Earnestine Leys, MD;  Location: [facility] ORS;  Service: Orthopedics;  Laterality: Right;  . IRRIGATION AND DEBRIDEMENT FOOT Left  10/15/2016   Procedure: IRRIGATION AND DEBRIDEMENT FOOT-EXCISIONAL DEBRIDEMENT OF SKIN 3RD, 4TH AND 5TH TOES WITH APPLICATION OF APLIGRAFT;  Surgeon: Albertine Patricia, DPM;  Location: [facility] ORS;  Service: Podiatry;  Laterality: Left;  necrotic,gangrene  . IRRIGATION AND DEBRIDEMENT FOOT Left 12/09/2016   Procedure: IRRIGATION AND DEBRIDEMENT FOOT-MUSCLE/FASCIA, RESECTION OF FOURTH AND FIFTH METATARSAL NECROTIC BONE;  Surgeon: Albertine Patricia, DPM;  Location: [facility] ORS;  Service: Podiatry;  Laterality: Left;  . IRRIGATION AND DEBRIDEMENT FOOT Right 12/23/2018   Procedure: IRRIGATION AND DEBRIDEMENT FOOT and wound vac application;  Surgeon: Samara Deist, DPM;  Location: [facility] ORS;  Service: Podiatry;  Laterality: Right;  . LEFT HEART CATH N/A 06/25/2019   Procedure: Left Heart Cath and Coronary Angiography;  Surgeon: Isaias Cowman, MD;  Location: [place] CV LAB;  Service: Cardiovascular;  Laterality: N/A;  . LEFT HEART CATH AND CORONARY ANGIOGRAPHY N/A 06/05/2018   Procedure: LEFT HEART CATH AND CORONARY ANGIOGRAPHY;  Surgeon: Yolonda Kida, MD;  Location: [place] CV LAB;  Service: Cardiovascular;  Laterality: N/A;  . LOWER EXTREMITY ANGIOGRAPHY Left 08/16/2016   Procedure: Lower Extremity Angiography;  Surgeon: Algernon Huxley, MD;  Location: [place] CV LAB;  Service: Cardiovascular;  Laterality: Left;  . LOWER EXTREMITY ANGIOGRAPHY Left 09/01/2016   Procedure: Lower Extremity Angiography;  Surgeon: Algernon Huxley, MD;  Location: [place] CV LAB;  Service:  Cardiovascular;  Laterality: Left;  . LOWER EXTREMITY ANGIOGRAPHY Left 10/14/2016   Procedure: Lower Extremity Angiography;  Surgeon: Algernon Huxley, MD;  Location: [place] CV LAB;  Service: Cardiovascular;  Laterality: Left;  . LOWER EXTREMITY ANGIOGRAPHY Left 12/20/2016   Procedure: Lower Extremity Angiography;  Surgeon: Algernon Huxley, MD;  Location: [place] CV LAB;  Service: Cardiovascular;  Laterality: Left;  . LOWER  EXTREMITY ANGIOGRAPHY Right 12/18/2018   Procedure: LOWER EXTREMITY ANGIOGRAPHY;  Surgeon: Algernon Huxley, MD;  Location: [place] CV LAB;  Service: Cardiovascular;  Laterality: Right;  . LOWER EXTREMITY INTERVENTION  09/01/2016   Procedure: Lower Extremity Intervention;  Surgeon: Algernon Huxley, MD;  Location: [place] CV LAB;  Service: Cardiovascular;;  . PROSTATE SURGERY     removal  . TONSILLECTOMY     as a child    Prior to Admission medications   Medication Sig Start Date End Date Taking? Authorizing Provider  atorvastatin (LIPITOR) 80 MG tablet Take 80 mg by mouth at bedtime.    [provider]  bisacodyl (DULCOLAX) 5 MG EC tablet Take 1 tablet (5 mg total) by mouth daily as needed for moderate constipation. 04/10/20   Lorella Nimrod, MD  chlorthalidone (HYGROTON) 25 MG tablet Take 25 mg by mouth daily.    [provider]  diltiazem (DILACOR XR) 120 MG 24 hr capsule Take 120 mg by mouth daily.    [provider]  divalproex (DEPAKOTE) 500 MG DR tablet Take 500 mg by mouth daily.  02/18/20   [provider]  Ensure Max Protein (ENSURE MAX PROTEIN) LIQD Take 330 mLs (11 oz total) by mouth 2 (two) times daily. 04/10/20   Lorella Nimrod, MD  ferrous sulfate 325 (65 FE) MG tablet Take 325 mg by mouth daily with breakfast.    [provider]  gabapentin (NEURONTIN) 300 MG capsule Take 300 mg by mouth 3 (three) times daily.  02/07/19   [provider]  Insulin Glargine (BASAGLAR KWIKPEN) 100 UNIT/ML Inject 28 Units into the skin at bedtime. 04/08/20   [provider]  isosorbide mononitrate (IMDUR) 30 MG 24 hr tablet Take 60 mg by mouth daily. 11/07/19   [provider]  KLOR-CON M20 20 MEQ tablet Take 20 mEq by mouth 2 (two) times daily. 02/18/20   [provider]  metFORMIN (GLUCOPHAGE) 500 MG tablet Take 500 mg by mouth 2 (two) times daily with a meal.    [provider]  metoprolol succinate (TOPROL-XL)  25 MG 24 hr tablet Take 1 tablet (25 mg total) by mouth daily. Take with or immediately following a meal. 04/10/20   Lorella Nimrod, MD  NOVOLOG FLEXPEN 100 UNIT/ML FlexPen Inject 4 Units into the skin 3 (three) times daily with meals. 04/08/20   [provider]  Rivaroxaban (XARELTO) 15 MG TABS tablet Take 1 tablet (15 mg total) by mouth daily with supper. Hold Xarelto for 3 days.  Start on January 09, 2019. 01/09/19   [name], MD    Allergies Contrast media [iodinated diagnostic agents], Iohexol, Metrizamide, and Latex  Family History  Problem Relation Age of Onset  . Hypertension Mother   . Diabetes Mother   . Prostate cancer Neg Hx   . Bladder Cancer Neg Hx   . Kidney disease Neg Hx     Social History Social History   Tobacco Use  . Smoking status: Former Smoker    Packs/day: 0.25    Types: Cigarettes    Quit  date: 10/14/1994    Years since quitting: 25.8  . Smokeless tobacco: Never Used  Vaping Use  . Vaping Use: Never used  Substance Use Topics  . Alcohol use: No    Alcohol/week: 0.0 standard drinks  . Drug use: No    Review of Systems  Unable to be accurately assessed due to patient's depressed mental status. ____________________________________________   PHYSICAL EXAM:  VITAL SIGNS: Vitals:   07/28/20 1442 07/28/20 1455  BP:  114/72  Pulse: (!) 42 60  Resp: 19 (!) 21  Temp:    SpO2: 95% 96%    Exam upon arrival to the ED prior to Narcan. Constitutional: Somnolent, opens eyes to sternal rub and localizes to pain.  Follows simple commands in bilateral upper extremities, such as show me his thumb.  No apparent deficit.  Quickly falls back asleep with sonorous respirations. Eyes: Conjunctivae are normal. PERRL and pinpoint. EOMI. Head: Atraumatic. Nose: No congestion/rhinnorhea. Mouth/Throat: Mucous membranes are dry.  Oropharynx non-erythematous. Neck: No stridor. No cervical spine tenderness to palpation. Cardiovascular: Normal rate, regular  rhythm. Grossly normal heart sounds.  Good peripheral circulation. Respiratory: Sonorous respirations. Normal respiratory effort.  No retractions. Lungs CTAB. Gastrointestinal: Soft , nondistended, nontender to palpation. No CVA tenderness. Musculoskeletal: No lower extremity tenderness nor edema.  No joint effusions. No signs of acute trauma. Neurologic: . No gross focal neurologic deficits are appreciated.  Somnolent on arrival. Skin:  Skin is warm, dry and intact. No rash noted. Psychiatric: Mood and affect are difficult to assess  ____________________________________________   LABS (all labs ordered are listed, but only abnormal results are displayed)  Labs Reviewed  COMPREHENSIVE METABOLIC PANEL - Abnormal; Notable for the following components:      Result Value   BUN 40 (*)    Creatinine, Ser 1.62 (*)    Calcium 8.5 (*)    Albumin 3.2 (*)    GFR, Estimated 42 (*)    All other components within normal limits  URINALYSIS, COMPLETE (UACMP) WITH MICROSCOPIC - Abnormal; Notable for the following components:   Color, Urine YELLOW (*)    APPearance HAZY (*)    Hgb urine dipstick LARGE (*)    RBC / HPF >50 (*)    All other components within normal limits  LACTIC ACID, PLASMA - Abnormal; Notable for the following components:   Lactic Acid, Venous 2.2 (*)    All other components within normal limits  TROPONIN I (HIGH SENSITIVITY) - Abnormal; Notable for the following components:   Troponin I (High Sensitivity) 89 (*)    All other components within normal limits  SARS CORONAVIRUS 2 (TAT 6-24 HRS)  CBC WITH DIFFERENTIAL/PLATELET  MAGNESIUM  LACTIC ACID, PLASMA  TROPONIN I (HIGH SENSITIVITY)   ____________________________________________  12 Lead EKG  Sinus rhythm with sinus arrhythmia, rate of 80 bpm.  Poor quality EKG with significant artifact.  Apparently normal intervals.  Inferior and lateral ST depressions without STEMI criteria.  Similar to previous from 2 weeks  ago. ____________________________________________  RADIOLOGY  ED MD interpretation: CT head reviewed by me without evidence of acute intracranial pathology.  Official radiology report(s): CT Head Wo Contrast  Result Date: 07/28/2020 CLINICAL DATA:  Mental status changes. EXAM: CT HEAD WITHOUT CONTRAST TECHNIQUE: Contiguous axial images were obtained from the base of the skull through the vertex without intravenous contrast. COMPARISON:  MRI 06/26/2020.  CT scan 06/25/2020. FINDINGS: Brain: There is no evidence for acute hemorrhage, hydrocephalus, mass lesion, or abnormal extra-axial fluid collection. No definite CT  evidence for acute infarction. Diffuse loss of parenchymal volume is consistent with atrophy. Patchy low attenuation in the deep hemispheric and periventricular white matter is nonspecific, but likely reflects chronic microvascular ischemic demyelination. Vascular: No hyperdense vessel or unexpected calcification. Skull: No evidence for fracture. No worrisome lytic or sclerotic lesion. Sinuses/Orbits: The visualized paranasal sinuses and mastoid air cells are clear. Visualized portions of the globes and intraorbital fat are unremarkable. Other: None. IMPRESSION: 1. No acute intracranial abnormality. 2. Atrophy with chronic small vessel white matter ischemic disease. Electronically Signed   By: Misty Stanley M.D.   On: 07/28/2020 13:35    ____________________________________________   PROCEDURES and INTERVENTIONS  Procedure(s) performed (including Critical Care):  .1-3 Lead EKG Interpretation Performed by: Vladimir Crofts, MD Authorized by: Vladimir Crofts, MD     Interpretation: normal     ECG rate:  60   ECG rate assessment: normal     Rhythm: sinus rhythm     Ectopy: none     Conduction: normal      Medications  naloxone (NARCAN) injection 0.2 mg (0.2 mg Intravenous Given 07/28/20 1314)  lactated ringers bolus 500 mL (0 mLs Intravenous Stopped 07/28/20 1450)     ____________________________________________   MDM / ED COURSE   86 year old male presents to the ED poorly responsive, likely due to an accidental opiate overdose.  Normal vitals on room air.  Exam with sonorous respirations and pinpoint pupils, but no evidence of neurovascular deficits, laterality, distress or signs of external trauma.  Improving mental status after Narcan administration.  Blood work with mild lactic acidosis at 2.2, and CKD at baseline.  Urinalysis with microscopic hematuria without infectious features.  Patient received 500 cc fluid bolus as well as Narcan with improvement of his mental status.  Denying intentional overdose or suicidality.  Patient signed out to oncoming provider to ensure continued improvement of mental status without need of redosing of Narcan, rechecks of his lactic acid and troponin.   Clinical Course as of 07/28/20 [place] Jul 28, 2020  1409 Reassessed.  Patient more awake after Narcan.  Continues to have a nonfocal examination. [DS]    Clinical Course User Index [DS] Vladimir Crofts, MD    ____________________________________________   FINAL CLINICAL IMPRESSION(S) / ED DIAGNOSES  Final diagnoses:  None     ED Discharge Orders    None       Keegan Bensch   Note:  This document was prepared using Dragon voice recognition software and may include unintentional dictation errors.   Vladimir Crofts, MD 07/28/20 [phone]

## 2020-07-29 DIAGNOSIS — I130 Hypertensive heart and chronic kidney disease with heart failure and stage 1 through stage 4 chronic kidney disease, or unspecified chronic kidney disease: Secondary | ICD-10-CM | POA: Diagnosis present

## 2020-07-29 DIAGNOSIS — G9340 Encephalopathy, unspecified: Secondary | ICD-10-CM | POA: Diagnosis not present

## 2020-07-29 DIAGNOSIS — T40601A Poisoning by unspecified narcotics, accidental (unintentional), initial encounter: Secondary | ICD-10-CM | POA: Diagnosis present

## 2020-07-29 DIAGNOSIS — I5032 Chronic diastolic (congestive) heart failure: Secondary | ICD-10-CM | POA: Diagnosis present

## 2020-07-29 DIAGNOSIS — Z888 Allergy status to other drugs, medicaments and biological substances status: Secondary | ICD-10-CM | POA: Diagnosis not present

## 2020-07-29 DIAGNOSIS — E1151 Type 2 diabetes mellitus with diabetic peripheral angiopathy without gangrene: Secondary | ICD-10-CM | POA: Diagnosis present

## 2020-07-29 DIAGNOSIS — Z79891 Long term (current) use of opiate analgesic: Secondary | ICD-10-CM | POA: Diagnosis not present

## 2020-07-29 DIAGNOSIS — Z91041 Radiographic dye allergy status: Secondary | ICD-10-CM | POA: Diagnosis not present

## 2020-07-29 DIAGNOSIS — Z91040 Latex allergy status: Secondary | ICD-10-CM | POA: Diagnosis not present

## 2020-07-29 DIAGNOSIS — E11649 Type 2 diabetes mellitus with hypoglycemia without coma: Secondary | ICD-10-CM | POA: Diagnosis present

## 2020-07-29 DIAGNOSIS — Z20822 Contact with and (suspected) exposure to covid-19: Secondary | ICD-10-CM | POA: Diagnosis present

## 2020-07-29 DIAGNOSIS — I2510 Atherosclerotic heart disease of native coronary artery without angina pectoris: Secondary | ICD-10-CM | POA: Diagnosis present

## 2020-07-29 DIAGNOSIS — Z89511 Acquired absence of right leg below knee: Secondary | ICD-10-CM | POA: Diagnosis not present

## 2020-07-29 DIAGNOSIS — I480 Paroxysmal atrial fibrillation: Secondary | ICD-10-CM | POA: Diagnosis not present

## 2020-07-29 DIAGNOSIS — I248 Other forms of acute ischemic heart disease: Secondary | ICD-10-CM | POA: Diagnosis present

## 2020-07-29 DIAGNOSIS — G8929 Other chronic pain: Secondary | ICD-10-CM | POA: Diagnosis present

## 2020-07-29 DIAGNOSIS — Z89512 Acquired absence of left leg below knee: Secondary | ICD-10-CM | POA: Diagnosis not present

## 2020-07-29 DIAGNOSIS — J9601 Acute respiratory failure with hypoxia: Secondary | ICD-10-CM | POA: Diagnosis present

## 2020-07-29 DIAGNOSIS — F0390 Unspecified dementia without behavioral disturbance: Secondary | ICD-10-CM | POA: Diagnosis present

## 2020-07-29 DIAGNOSIS — G9341 Metabolic encephalopathy: Secondary | ICD-10-CM | POA: Diagnosis not present

## 2020-07-29 DIAGNOSIS — G928 Other toxic encephalopathy: Secondary | ICD-10-CM | POA: Diagnosis present

## 2020-07-29 DIAGNOSIS — N1832 Chronic kidney disease, stage 3b: Secondary | ICD-10-CM | POA: Diagnosis present

## 2020-07-29 DIAGNOSIS — Z79899 Other long term (current) drug therapy: Secondary | ICD-10-CM | POA: Diagnosis not present

## 2020-07-29 DIAGNOSIS — I482 Chronic atrial fibrillation, unspecified: Secondary | ICD-10-CM | POA: Diagnosis present

## 2020-07-29 DIAGNOSIS — Z66 Do not resuscitate: Secondary | ICD-10-CM | POA: Diagnosis present

## 2020-07-29 DIAGNOSIS — G4733 Obstructive sleep apnea (adult) (pediatric): Secondary | ICD-10-CM | POA: Diagnosis present

## 2020-07-29 DIAGNOSIS — E872 Acidosis: Secondary | ICD-10-CM | POA: Diagnosis present

## 2020-07-29 DIAGNOSIS — G40909 Epilepsy, unspecified, not intractable, without status epilepticus: Secondary | ICD-10-CM | POA: Diagnosis present

## 2020-07-29 LAB — URINE DRUG SCREEN, QUALITATIVE (ARMC ONLY)
Amphetamines, Ur Screen: NOT DETECTED
Barbiturates, Ur Screen: NOT DETECTED
Benzodiazepine, Ur Scrn: NOT DETECTED
Cannabinoid 50 Ng, Ur ~~LOC~~: NOT DETECTED
Cocaine Metabolite,Ur ~~LOC~~: NOT DETECTED
MDMA (Ecstasy)Ur Screen: NOT DETECTED
Methadone Scn, Ur: NOT DETECTED
Opiate, Ur Screen: NOT DETECTED
Phencyclidine (PCP) Ur S: NOT DETECTED
Tricyclic, Ur Screen: NOT DETECTED

## 2020-07-29 LAB — BLOOD CULTURE ID PANEL (REFLEXED) - BCID2

## 2020-07-29 LAB — CBC
HCT: 42.5 % (ref 39.0–52.0)
Hemoglobin: 14 g/dL (ref 13.0–17.0)
MCH: 29.9 pg (ref 26.0–34.0)
MCHC: 32.9 g/dL (ref 30.0–36.0)
MCV: 90.8 fL (ref 80.0–100.0)
Platelets: 224 10*3/uL (ref 150–400)
RBC: 4.68 MIL/uL (ref 4.22–5.81)
RDW: 14.2 % (ref 11.5–15.5)
WBC: 8.7 10*3/uL (ref 4.0–10.5)
nRBC: 0 % (ref 0.0–0.2)

## 2020-07-29 LAB — SARS CORONAVIRUS 2 (TAT 6-24 HRS): SARS Coronavirus 2: NEGATIVE

## 2020-07-29 LAB — COMPREHENSIVE METABOLIC PANEL
ALT: 28 U/L (ref 0–44)
AST: 40 U/L (ref 15–41)
Albumin: 3.2 g/dL — ABNORMAL LOW (ref 3.5–5.0)
Alkaline Phosphatase: 96 U/L (ref 38–126)
Anion gap: 10 (ref 5–15)
BUN: 40 mg/dL — ABNORMAL HIGH (ref 8–23)
CO2: 27 mmol/L (ref 22–32)
Calcium: 8.7 mg/dL — ABNORMAL LOW (ref 8.9–10.3)
Chloride: 102 mmol/L (ref 98–111)
Creatinine, Ser: 1.62 mg/dL — ABNORMAL HIGH (ref 0.61–1.24)
GFR, Estimated: 42 mL/min — ABNORMAL LOW (ref >60)
Glucose, Bld: 236 mg/dL — ABNORMAL HIGH (ref 70–99)
Potassium: 4.7 mmol/L (ref 3.5–5.1)
Sodium: 139 mmol/L (ref 135–145)
Total Bilirubin: 0.8 mg/dL (ref 0.3–1.2)
Total Protein: 6.6 g/dL (ref 6.5–8.1)

## 2020-07-29 LAB — GLUCOSE, CAPILLARY
Glucose-Capillary: 67 mg/dL — ABNORMAL LOW (ref 70–99)
Glucose-Capillary: 174 mg/dL — ABNORMAL HIGH (ref 70–99)
Glucose-Capillary: 248 mg/dL — ABNORMAL HIGH (ref 70–99)
Glucose-Capillary: 92 mg/dL (ref 70–99)
Glucose-Capillary: 164 mg/dL — ABNORMAL HIGH (ref 70–99)

## 2020-07-29 LAB — LACTIC ACID, PLASMA: Lactic Acid, Venous: 2.6 mmol/L (ref 0.5–1.9)

## 2020-07-29 MED ORDER — ADULT MULTIVITAMIN W/MINERALS CH
1.0000 | ORAL_TABLET | Freq: Every day | ORAL | Status: DC
  Administered 2020-07-30 – 2020-08-04 (×6): 1 via ORAL
  Filled 2020-07-29 (×6): qty 1

## 2020-07-29 MED ORDER — GABAPENTIN 100 MG PO CAPS
100.0000 mg | ORAL_CAPSULE | Freq: Three times a day (TID) | ORAL | Status: DC
  Administered 2020-07-29 – 2020-08-04 (×20): 100 mg via ORAL
  Filled 2020-07-29 (×20): qty 1

## 2020-07-29 MED ORDER — METOPROLOL TARTRATE 5 MG/5ML IV SOLN
5.0000 mg | INTRAVENOUS | Status: DC | PRN
  Administered 2020-07-29 – 2020-07-31 (×2): 5 mg via INTRAVENOUS
  Filled 2020-07-29 (×2): qty 5

## 2020-07-29 MED ORDER — DILTIAZEM HCL ER COATED BEADS 120 MG PO CP24
120.0000 mg | ORAL_CAPSULE | Freq: Every day | ORAL | Status: DC
  Administered 2020-07-29 – 2020-08-01 (×4): 120 mg via ORAL
  Filled 2020-07-29 (×4): qty 1

## 2020-07-29 MED ORDER — ENSURE MAX PROTEIN PO LIQD
11.0000 [oz_av] | Freq: Two times a day (BID) | ORAL | Status: DC
  Administered 2020-07-29 – 2020-08-04 (×10): 11 [oz_av] via ORAL
  Filled 2020-07-29: qty 330

## 2020-07-29 NOTE — Progress Notes (Addendum)
PHARMACY - PHYSICIAN COMMUNICATION CRITICAL VALUE ALERT - BLOOD CULTURE IDENTIFICATION (BCID)  [name] is an 86 y.o. male who presented to [facility] on 07/28/2020 with a chief complaint of AMS  Assessment: blood cultures from 3/7 with GPR (anaerobic bottle only) no BCID and GPC (in aerobic bottle only) with BCID = MRSE.  The 2nd blood cx set remains no growth.   Name of physician (or Provider) Contacted: Dr Si Raider  Current antibiotics: none  Changes to prescribed antibiotics recommended:  Monitor off antibiotics - likely contaminants  Results for orders placed or performed during the hospital encounter of 07/28/20  Blood Culture ID Panel (Reflexed) (Collected: 07/28/2020  7:56 PM)  Result Value Ref Range   Enterococcus faecalis NOT DETECTED NOT DETECTED   Enterococcus Faecium NOT DETECTED NOT DETECTED   Listeria monocytogenes NOT DETECTED NOT DETECTED   Staphylococcus species DETECTED (A) NOT DETECTED   Staphylococcus aureus (BCID) NOT DETECTED NOT DETECTED   Staphylococcus epidermidis DETECTED (A) NOT DETECTED   Staphylococcus lugdunensis NOT DETECTED NOT DETECTED   Streptococcus species NOT DETECTED NOT DETECTED   Streptococcus agalactiae NOT DETECTED NOT DETECTED   Streptococcus pneumoniae NOT DETECTED NOT DETECTED   Streptococcus pyogenes NOT DETECTED NOT DETECTED   A.calcoaceticus-baumannii NOT DETECTED NOT DETECTED   Bacteroides fragilis NOT DETECTED NOT DETECTED   Enterobacterales NOT DETECTED NOT DETECTED   Enterobacter cloacae complex NOT DETECTED NOT DETECTED   Escherichia coli NOT DETECTED NOT DETECTED   Klebsiella aerogenes NOT DETECTED NOT DETECTED   Klebsiella oxytoca NOT DETECTED NOT DETECTED   Klebsiella pneumoniae NOT DETECTED NOT DETECTED   Proteus species NOT DETECTED NOT DETECTED   Salmonella species NOT DETECTED NOT DETECTED   Serratia marcescens NOT DETECTED NOT DETECTED   Haemophilus influenzae NOT DETECTED NOT DETECTED   Neisseria meningitidis  NOT DETECTED NOT DETECTED   Pseudomonas aeruginosa NOT DETECTED NOT DETECTED   Stenotrophomonas maltophilia NOT DETECTED NOT DETECTED   Candida albicans NOT DETECTED NOT DETECTED   Candida auris NOT DETECTED NOT DETECTED   Candida glabrata NOT DETECTED NOT DETECTED   Candida krusei NOT DETECTED NOT DETECTED   Candida parapsilosis NOT DETECTED NOT DETECTED   Candida tropicalis NOT DETECTED NOT DETECTED   Cryptococcus neoformans/gattii NOT DETECTED NOT DETECTED   Methicillin resistance mecA/C DETECTED (A) NOT DETECTED    Doreene Eland, PharmD, BCPS.   Work Cell: [phone] 07/29/2020 12:25 PM

## 2020-07-29 NOTE — Plan of Care (Signed)
Pt admitted from ED with alert, oriented to self only, with confusion, tele attached and noted Afib on monitor with controlled rate. VSS, NAD. Pt with BS of 67 at admission, pt given boxed lunch and juice. Pt c/o being hungry on arrival. Rechecked BS and was 73. Contacted provider who stated to hold Lantus. Pt PUI d/t Covid test in progress. Pt with no c/o pain. Oriented to unit. Pt given remote, call bell and was changed into floor gown. Bed in low position. SR up x4 for safety and bed alarm on. Bed wheels locked. Frequent observation, see assessment details.

## 2020-07-29 NOTE — Progress Notes (Signed)
OT Cancellation Note  Patient Details Name: [name] MRN: [id number] DOB: 02/09/1935   Cancelled Treatment:    Reason Eval/Treat Not Completed: Medical issues which prohibited therapy. Consult received, chart reviewed. Telemetry indicating pt converted to Afib with HR fluctuating between 120's-140's at rest. Will hold OT evaluation at this time and re-attempt at later date/time as medically appropriate pending improved HR for exertional activity.   Hanley Hays, MPH, MS, OTR/L ascom [phone] 07/29/20, 2:29 PM

## 2020-07-29 NOTE — Evaluation (Signed)
Physical Therapy Evaluation Patient Details Name: [name] MRN: [id number] DOB: June 24, 1934 Today's Date: 07/29/2020   History of Present Illness  [name] is a 86 y.o. male with medical history significant for DM, HTN, PAD status post bilateral BKA, CAD, diastolic heart failure, CKD stage IIIa, paroxysmal A. fib on Xarelto, seizure disorder, history of UTIs, chronic pain on chronic opiates, hospitalized from 2/2-2/3 with altered mental status believed related to unintentional opiate overdose with suspected underlying dementia, and recently seen in the ER on 2/22 with dysuria with urine culture showing less than 10,000 colonies, who was brought to the emergency room with altered mental status, initially showing response to Narcan but with fluctuating mental status after several hours of observation in the emergency room.  History is limited due to persistent confusion.  Patient was apparently found to have decreased responsiveness from his baseline on arrival of his home health nurse and EMS was activated.   Clinical Impression  Patient received in bed. Patient is agreeable to PT assessment. Requires assistance to maintain balance and don B LE prosthetics. Patient requires increased time and effort to perform. Patient attempted to stand with bed elevated and assistance. He was unable to clear bottom from bed. Patient required min assist to return to supine with increased time and cues. He will continue to benefit from skilled PT while here to improve strength and functional independence.     Follow Up Recommendations SNF;Supervision/Assistance - 24 hour    Equipment Recommendations  None recommended by PT    Recommendations for Other Services       Precautions / Restrictions Precautions Precautions: Fall Restrictions Weight Bearing Restrictions: No      Mobility  Bed Mobility Overal bed mobility: Needs Assistance Bed Mobility: Supine to Sit;Sit to Supine      Supine to sit: Supervision Sit to supine: Min assist   General bed mobility comments: requires assist to bring LEs back up onto bed.    Transfers Overall transfer level: Needs assistance   Transfers: Sit to/from Stand Sit to Stand: From elevated surface         General transfer comment: patient unable to come to standing despite elevated bed and assist. Difficulty following cues for pushing up from bed to raise up.  Ambulation/Gait             General Gait Details: unable  Stairs            Wheelchair Mobility    Modified Rankin (Stroke Patients Only)       Balance Overall balance assessment: Needs assistance Sitting-balance support: No upper extremity supported;Feet unsupported Sitting balance-Leahy Scale: Fair Sitting balance - Comments: fait balance donning prosthetics while sitting edge of bed Postural control: Posterior lean     Standing balance comment: unable                             Pertinent Vitals/Pain Pain Assessment: No/denies pain    Home Living Family/patient expects to be discharged to:: Skilled nursing facility Living Arrangements: Spouse/significant other Available Help at Discharge: Family;Available 24 hours/day Type of Home: House Home Access: Ramped entrance     Home Layout: One level Home Equipment: Cane - single point;Walker - 2 wheels;Grab bars - toilet;Wheelchair - manual;Shower seat Additional Comments: Patient reports he was not walking recently, mostly transferring. Unsure of accuracy of info.    Prior Function Level of Independence: Needs assistance   Gait / Transfers Assistance  Needed: B LE prosthetics, requires assistance for transfers chair to wheechair  ADL's / Homemaking Assistance Needed: requires assistance  Comments: Patient limited historian, will verify with family as available.  Does suggest WC level as primary mobility, scoot/SB transfer to/from seating surfaces; partial standing (pulling  on grab bar) to manage clothing with toilet needs.  Utilizes bilat UE/LEs to self-propel manual WC.  Does endose fall history; unable to quantify. From prior admission.     Hand Dominance        Extremity/Trunk Assessment                Communication   Communication: HOH  Cognition Arousal/Alertness: Awake/alert Behavior During Therapy: WFL for tasks assessed/performed Overall Cognitive Status: No family/caregiver present to determine baseline cognitive functioning                                 General Comments: patient requires multiple cues and re-direction for mobility.      General Comments      Exercises     Assessment/Plan    PT Assessment Patient needs continued PT services  PT Problem List Decreased strength;Decreased activity tolerance;Decreased mobility;Decreased safety awareness       PT Treatment Interventions Therapeutic exercise;Gait training;Functional mobility training;Therapeutic activities;Patient/family education;DME instruction    PT Goals (Current goals can be found in the Care Plan section)  Acute Rehab PT Goals Patient Stated Goal: to get stronger PT Goal Formulation: With patient Time For Goal Achievement: 08/12/20 Potential to Achieve Goals: Fair    Frequency Min 2X/week   Barriers to discharge Decreased caregiver support      Co-evaluation               AM-PAC PT "6 Clicks" Mobility  Outcome Measure Help needed turning from your back to your side while in a flat bed without using bedrails?: A Little Help needed moving from lying on your back to sitting on the side of a flat bed without using bedrails?: A Little Help needed moving to and from a bed to a chair (including a wheelchair)?: Total Help needed standing up from a chair using your arms (e.g., wheelchair or bedside chair)?: Total Help needed to walk in hospital room?: Total Help needed climbing 3-5 steps with a railing? : Total 6 Click Score: 10     End of Session Equipment Utilized During Treatment: Gait belt Activity Tolerance: Patient limited by lethargy Patient left: in bed;with call bell/phone within reach;with bed alarm set Nurse Communication: Mobility status PT Visit Diagnosis: Other abnormalities of gait and mobility (R26.89);Muscle weakness (generalized) (M62.81)    Time: 9147-8295 PT Time Calculation (min) (ACUTE ONLY): 40 min   Charges:   PT Evaluation $PT Eval Moderate Complexity: 1 Mod PT Treatments $Therapeutic Activity: 23-37 mins        Koty Anctil, PT, GCS 07/29/20,3:07 PM

## 2020-07-29 NOTE — Progress Notes (Signed)
Initial Nutrition Assessment  DOCUMENTATION CODES:   Obesity unspecified  INTERVENTION:   Ensure Max protein supplement BID, each supplement provides 150kcal and 30g of protein.  MVI po daily  NUTRITION DIAGNOSIS:   Inadequate oral intake related to acute illness as evidenced by per patient/family report.  GOAL:   Patient will meet greater than or equal to 90% of their needs  MONITOR:   PO intake,Supplement acceptance,Labs,Weight trends,Skin,I & O's  REASON FOR ASSESSMENT:   Malnutrition Screening Tool    ASSESSMENT:   86 y.o. male with medical history significant for DM, HTN, PAD status post bilateral BKA, CAD, diastolic heart failure, CKD stage IIIa, paroxysmal A. fib on Xarelto, seizure disorder, history of UTIs, chronic pain on chronic opiates, hospitalized from 2/2-2/3 with altered mental status believed related to unintentional opiate overdose with suspected underlying dementia and recently seen in the ER on 2/22 with dysuria who was brought to the emergency room with altered mental status   Met with pt and family in room today. Pt is well known to this RD from numerous previous admits. Pt generally with good appetite and oral intake at baseline. Family reports that patient's appetite has been decreased over the past several days. Pt is eating fairly well in hospital; pt eating 60-70% of meals. RD discussed with pt and family the importance of adequate nutrition needed to preserve lean muscle. Pt enjoys chocolate Ensure and butter pecan Nepro supplements as he has drank these on previous admissions. RD will add supplements and MVI to help pt meet his estimated needs. Per chart, pt appears weight stable pta.   Medications reviewed and include: ferrous sulfate, insulin, KCl  Labs reviewed: BUN 40(H), creat 1.62(H) cbgs- 174, 248 x 24 hrs AIC 7.4(H)- 2/3  NUTRITION - FOCUSED PHYSICAL EXAM:  Flowsheet Row Most Recent Value  Orbital Region No depletion  Upper Arm Region  Moderate depletion  Thoracic and Lumbar Region No depletion  Buccal Region No depletion  Temple Region No depletion  Clavicle Bone Region Mild depletion  Clavicle and Acromion Bone Region Mild depletion  Scapular Bone Region Mild depletion  Dorsal Hand No depletion  Patellar Region Moderate depletion  Anterior Thigh Region Moderate depletion  Posterior Calf Region Unable to assess  Edema (RD Assessment) None  Hair Reviewed  Eyes Reviewed  Mouth Reviewed  Skin Reviewed  Nails Reviewed     Diet Order:   Diet Order            Diet heart healthy/carb modified Room service appropriate? Yes; Fluid consistency: Thin  Diet effective now                EDUCATION NEEDS:   Education needs have been addressed  Skin:  Skin Assessment: Reviewed RN Assessment (petechiae)  Last BM:  pta  Height:   Ht Readings from Last 1 Encounters:  07/28/20 '5\' 7"'  (1.702 m)    Weight:   Wt Readings from Last 1 Encounters:  07/28/20 88.5 kg    Ideal Body Weight:  59 kg (adjusted for b/l BKAs)  BMI:  Body mass index is 30.56 kg/m.  Estimated Nutritional Needs:   Kcal:  1700-2000kcal/day  Protein:  85-100g/day  Fluid:  1.7-2.0L/day  Koleen Distance MS, RD, LDN Please refer to [facility] for RD and/or RD on-call/weekend/after hours pager

## 2020-07-29 NOTE — Consult Note (Signed)
[place] for Rivaroxaban Indication: atrial fibrillation  Allergies  Allergen Reactions  . Contrast Media [Iodinated Diagnostic Agents] Shortness Of Breath    SOB  . Iohexol Shortness Of Breath     Desc: Respiratory Distress, Laryngedema, diaphoresis, Onset Date: [id number]   . Metrizamide Shortness Of Breath    SOB SOB SOB   . Latex Rash    Patient Measurements: Height: 5\' 7"  (170.2 cm) Weight: 88.5 kg (195 lb 1.7 oz) IBW/kg (Calculated) : 66.1  Vital Signs: Temp: 97.8 F (36.6 C) (03/08 0723) Temp Source: Oral (03/08 0428) BP: 132/64 (03/08 0723) Pulse Rate: 64 (03/08 0723)  Labs: Recent Labs    07/28/20 1255 07/28/20 1454  HGB 13.3  --   HCT 39.5  --   PLT 211  --   CREATININE 1.62*  --   TROPONINIHS 89* 81*    Estimated Creatinine Clearance: 36.7 mL/min (A) (by C-G formula based on SCr of 1.62 mg/dL (H)).   Medical History: Past Medical History:  Diagnosis Date  . Arthritis   . Atrial fibrillation ([place])   . Carcinoma of prostate ([place])   . CHF (congestive heart failure) ([place])   . Chronic kidney disease   . Diabetes mellitus without complication ([place])   . ED (erectile dysfunction)   . Frequent urination   . Hyperlipidemia   . Hypertension   . Moderate mitral insufficiency   . Peripheral vascular disease ([place])   . Pneumonia 04/2016  . Prostate cancer ([place])   . Sleep apnea    OSA--USE C-PAP  . Ulcer of left foot due to type 2 diabetes mellitus ([place])   . Urinary stress incontinence, male     Medications:  Medications Prior to Admission  Medication Sig Dispense Refill Last Dose  . atorvastatin (LIPITOR) 80 MG tablet Take 80 mg by mouth at bedtime.   07/27/2020 at Unknown time  . bisacodyl (DULCOLAX) 5 MG EC tablet Take 1 tablet (5 mg total) by mouth daily as needed for moderate constipation. 30 tablet 0 Past Month at Unknown time  . chlorthalidone (HYGROTON) 25 MG tablet Take 25 mg by mouth daily.   07/28/2020  at Unknown time  . diltiazem (DILACOR XR) 120 MG 24 hr capsule Take 120 mg by mouth daily.   07/28/2020 at Unknown time  . divalproex (DEPAKOTE) 500 MG DR tablet Take 500 mg by mouth daily.    07/28/2020 at Unknown time  . ferrous sulfate 325 (65 FE) MG tablet Take 325 mg by mouth daily with breakfast.   07/28/2020 at Unknown time  . gabapentin (NEURONTIN) 300 MG capsule Take 300 mg by mouth 3 (three) times daily.    07/28/2020 at Unknown time  . Insulin Glargine (BASAGLAR KWIKPEN) 100 UNIT/ML Inject 25 Units into the skin at bedtime.   07/27/2020 at Unknown time  . isosorbide mononitrate (IMDUR) 30 MG 24 hr tablet Take 60 mg by mouth daily.   07/28/2020 at Unknown time  . KLOR-CON M20 20 MEQ tablet Take 20 mEq by mouth 2 (two) times daily.   07/28/2020 at Unknown time  . metFORMIN (GLUCOPHAGE) 500 MG tablet Take 500 mg by mouth 2 (two) times daily with a meal.   07/28/2020 at Unknown time  . metoprolol succinate (TOPROL-XL) 25 MG 24 hr tablet Take 1 tablet (25 mg total) by mouth daily. Take with or immediately following a meal. 60 tablet 0 07/28/2020 at Unknown time  . NOVOLOG FLEXPEN 100 UNIT/ML FlexPen Inject 4 Units  into the skin 3 (three) times daily with meals.   07/28/2020 at Unknown time  . Rivaroxaban (XARELTO) 15 MG TABS tablet Take 1 tablet (15 mg total) by mouth daily with supper. Hold Xarelto for 3 days.  Start on January 09, 2019. 30 tablet 1 07/27/2020 at Unknown time  . TIADYLT ER 120 MG 24 hr capsule Take 120 mg by mouth at bedtime.   07/28/2020 at Unknown time  . Ensure Max Protein (ENSURE MAX PROTEIN) LIQD Take 330 mLs (11 oz total) by mouth 2 (two) times daily.      Scheduled:  . atorvastatin  80 mg Oral QHS  . chlorthalidone  25 mg Oral Daily  . divalproex  500 mg Oral Daily  . ferrous sulfate  325 mg Oral Q breakfast  . gabapentin  100 mg Oral TID  . insulin aspart  0-15 Units Subcutaneous TID WC  . insulin aspart  0-5 Units Subcutaneous QHS  . insulin glargine  15 Units Subcutaneous QHS  .  isosorbide mononitrate  60 mg Oral Daily  . metoprolol succinate  25 mg Oral Q breakfast  . potassium chloride SA  20 mEq Oral BID  . Rivaroxaban  15 mg Oral Q supper   Infusions:   PRN: acetaminophen **OR** acetaminophen, ondansetron **OR** ondansetron (ZOFRAN) IV Anti-infectives (From admission, onward)   None      Assessment: Pharmacy consulted to restart rivaroxaban for afib.   Goal of Therapy:  Monitor platelets by anticoagulation protocol: Yes   Plan:  Rivaroxaban 15 mg daily adjusted per renal function. CrCl < 50 ml/min using TBW.   Oswald Hillock, PharmD, BCPS 07/29/2020,8:17 AM

## 2020-07-29 NOTE — Progress Notes (Addendum)
PROGRESS NOTE    [name]  FAO:[id number] DOB: 02-Feb-1935 DOA: 07/28/2020 PCP: [facility]  Outpatient Specialists: [facility] (neurology, cardiology)    Brief Narrative:  From admission hpi: [name] is a 86 y.o. male with medical history significant for DM, HTN, PAD status post bilateral BKA, CAD, diastolic heart failure, CKD stage IIIa, paroxysmal A. fib on Xarelto, seizure disorder, history of UTIs, chronic pain on chronic opiates, hospitalized from 2/2-2/3 with altered mental status believed related to unintentional opiate overdose with suspected underlying dementia, and recently seen in the ER on 2/22 with dysuria with urine culture showing less than 10,000 colonies, who was brought to the emergency room with altered mental status, initially showing response to Narcan but with fluctuating mental status after several hours of observation in the emergency room.  History is limited due to persistent confusion.  Patient was apparently found to have decreased responsiveness from his baseline on arrival of his home health nurse and EMS was activated Patient was monitored for several hours in the ER but continued to have intermittent confusion and somnolence.  Hospitalist consulted for admission.   Assessment & Plan:   Active Problems:   S/P bilateral BKA (below knee amputation) (HCC)   Paroxysmal A-fib (HCC)   Seizure disorder (HCC)   CAD (coronary artery disease)   Acute toxic-metabolic encephalopathy   (HFpEF) heart failure with preserved ejection fraction (HCC)   AMS (altered mental status)   Chronic anticoagulation   Chronic kidney disease, stage 3a (HCC)   Lactic acidosis   Hypoxic   Chronic prescription opiate use   Elevated troponin  # Acute toxic metabolic encephalopathy Appears to have resolved this morning. Hospitalized for similar one month ago, then worked up with MRI and EEG which were both negative. Has had rapid improvement  overnight. Previously thought to be 2/2 opioids but does not on opioids at home currently. Home gabapentin may contribute. Presented with glucose of 67 so hypoglycemia may cause/contribute. Was hypoxic (resolved), which can contribute to encephalopathy. No electrolyte abnormalities or report of seizure-like activity. No cough or pulmonary infiltrate to suggest pneumonia. No dysuria and ua does not appear infected. Denies drug/alcohol use. CT head without acute findings. No clear septic picture. 1/4 bottles growing GPRs (discussed w/ ID, likely contaminant). Lactate elevated, appears to be chronic?  - f/u UDS - repeat lactate, cmp, cbc - f/u urine and blood cultures - seizure precautions  - decrease dose home gabapentin - has bed at [facility] (family had previously arranged to admit patient there). Pt/ot advising snf. Should be able to d/c there on 3/9  # Acute hypoxic respiratory failure Resolved rapidly  # Chronic a fib Here bradycardic initially so home dilt held. This PM RVR with HR 120-130s,   hemodynamically stable. - cont home metop, xarelto - add back home diltiazem, monitor  # Elevated troponin Chronically elevated, flat and stable, likely demand, no chest pain or ekg changes to suggest ischemia  #Chronic diastolic heart failure (HCC) # CAD Non-obstructive CAD on cath couple of years ago. CXR with pulmonary congestion, bnp elevated (this is chornic), this morning off o2, breathing comfortably, no edema - cont home metop, chlorthalidone, imdur - monitor  # Seizure disorcer Recent eeg neg for seizure - cont home depakote, reduce dose gabapentin - seizure precautions  # CKD3 Stable - monitor  # T2DM Hypoglycemia on presentation, resolved - cont home lantus 15 qhs - hold home met and novolog - SSI  # HTN Here  bp wnl - cont home chlorthalidone, metoprolol, atorvastatin  # PAD (peripheral artery disease) Status post bilateral BKA, stumps clean - cont home  atorva, xarelt  # OSA Not on cpap at home     DVT prophylaxis: home xarelto Code Status: dnr Family Communication: none @ bedside  Level of care: Progressive Cardiac Status is: Observation  The patient will require care spanning > 2 midnights and should be moved to inpatient because: Inpatient level of care appropriate due to severity of illness  Dispo: The patient is from: Home              Anticipated d/c is to: SNF              Patient currently is not medically stable to d/c.   Difficult to place patient No        Consultants:  none  Procedures: none  Antimicrobials:  none    Subjective: This morning feeling well. Not comfused. Has appetite. No chest pain or sob. No n/v/d.   Objective: Vitals:   07/28/20 2100 07/28/20 2212 07/29/20 0428 07/29/20 0723  BP: 109/65 120/74 132/68 132/64  Pulse: (!) 59 74 70 64  Resp: _0 Temp:  98.4 F (36.9 C) 98 F (36.7 C) 97.8 F (36.6 C)  TempSrc:  Oral Oral   SpO2: 96% 94% 94% 95%  Weight:  88.5 kg    Height:        Intake/Output Summary (Last 24 hours) at 07/29/2020 0743 Last data filed at 07/28/2020 1450 Gross per 24 hour  Intake 501.14 ml  Output --  Net 501.14 ml   Filed Weights   07/28/20 1247 07/28/20 2212  Weight: 90.9 kg 88.5 kg    Examination:  General exam: Appears calm and comfortable  Respiratory system: Clear to auscultation. Respiratory effort normal. Cardiovascular system: S1 & S2 heard, irreg irreg, mild systolic murmur Gastrointestinal system: Abdomen is nondistended, soft and nontender. No organomegaly or masses felt. Normal bowel sounds heard. Central nervous system: Alert and oriented. No focal neurological deficits. Extremities: Symmetric 5 x 5 power. B/l BKAs Skin: No rashes, lesions or ulcers Psychiatry: Judgement and insight appear normal. Mood & affect appropriate.     Data Reviewed: I have personally reviewed following labs and imaging studies  CBC: Recent Labs   Lab 07/28/20 1255  WBC 7.4  NEUTROABS 4.1  HGB 13.3  HCT 39.5  MCV 89.8  PLT 048   Basic Metabolic Panel: Recent Labs  Lab 07/28/20 1255  NA 142  K 4.2  CL 109  CO2 25  GLUCOSE 79  BUN 40*  CREATININE 1.62*  CALCIUM 8.5*  MG 2.2   GFR: Estimated Creatinine Clearance: 36.7 mL/min (A) (by C-G formula based on SCr of 1.62 mg/dL (H)). Liver Function Tests: Recent Labs  Lab 07/28/20 1255  AST 40  ALT 23  ALKPHOS 80  BILITOT 0.8  PROT 6.5  ALBUMIN 3.2*   No results for input(s): LIPASE, AMYLASE in the last 168 hours. No results for input(s): AMMONIA in the last 168 hours. Coagulation Profile: No results for input(s): INR, PROTIME in the last 168 hours. Cardiac Enzymes: No results for input(s): CKTOTAL, CKMB, CKMBINDEX, TROPONINI in the last 168 hours. BNP (last 3 results) No results for input(s): PROBNP in the last 8760 hours. HbA1C: No results for input(s): HGBA1C in the last 72 hours. CBG: Recent Labs  Lab 07/28/20 1250 07/28/20 2214 07/28/20 2242 07/29/20 0722  GLUCAP 67* 67* 73 174*  Lipid Profile: No results for input(s): CHOL, HDL, LDLCALC, TRIG, CHOLHDL, LDLDIRECT in the last 72 hours. Thyroid Function Tests: No results for input(s): TSH, T4TOTAL, FREET4, T3FREE, THYROIDAB in the last 72 hours. Anemia Panel: No results for input(s): VITAMINB12, FOLATE, FERRITIN, TIBC, IRON, RETICCTPCT in the last 72 hours. Urine analysis:    Component Value Date/Time   COLORURINE YELLOW (A) 07/28/2020 1255   APPEARANCEUR HAZY (A) 07/28/2020 1255   LABSPEC 1.019 07/28/2020 1255   PHURINE 5.0 07/28/2020 1255   GLUCOSEU NEGATIVE 07/28/2020 1255   HGBUR LARGE (A) 07/28/2020 1255   BILIRUBINUR NEGATIVE 07/28/2020 1255   KETONESUR NEGATIVE 07/28/2020 1255   PROTEINUR NEGATIVE 07/28/2020 1255   NITRITE NEGATIVE 07/28/2020 1255   LEUKOCYTESUR NEGATIVE 07/28/2020 1255   Sepsis Labs: _0 (procalcitonin:4,lacticidven:4)  ) Recent Results (from the past  240 hour(s))  SARS CORONAVIRUS 2 (TAT 6-24 HRS) Nasopharyngeal Nasopharyngeal Swab     Status: None   Collection Time: 07/28/20  1:57 PM   Specimen: Nasopharyngeal Swab  Result Value Ref Range Status   SARS Coronavirus 2 NEGATIVE NEGATIVE Final    Comment: (NOTE) SARS-CoV-2 target nucleic acids are NOT DETECTED.  The SARS-CoV-2 RNA is generally detectable in upper and lower respiratory specimens during the acute phase of infection. Negative results do not preclude SARS-CoV-2 infection, do not rule out co-infections with other pathogens, and should not be used as the sole basis for treatment or other patient management decisions. Negative results must be combined with clinical observations, patient history, and epidemiological information. The expected result is Negative.  Fact Sheet for Patients: SugarRoll.be  Fact Sheet for Healthcare Providers: https://www.woods-mathews.com/  This test is not yet approved or cleared by the [place] FDA and  has been authorized for detection and/or diagnosis of SARS-CoV-2 by FDA under an Emergency Use Authorization (EUA). This EUA will remain  in effect (meaning this test can be used) for the duration of the COVID-19 declaration under Se ction 564(b)(1) of the Act, 21 U.S.C. section 360bbb-3(b)(1), unless the authorization is terminated or revoked sooner.  Performed at [facility], [place] [address], [place], [place] [address]   Blood culture (routine x 2)     Status: None (Preliminary result)   Collection Time: 07/28/20  7:56 PM   Specimen: BLOOD  Result Value Ref Range Status   Specimen Description BLOOD LEFT AC  Final   Special Requests   Final    BOTTLES DRAWN AEROBIC AND ANAEROBIC Blood Culture results may not be optimal due to an inadequate volume of blood received in culture bottles   Culture  Setup Time   Final    GRAM POSITIVE RODS ANAEROBIC BOTTLE ONLY CRITICAL RESULT CALLED TO,  READ BACK BY AND VERIFIED WITHLloyd Huger AT 7425 07/29/20 [place] Performed at [facility], [address], [place],  [address]    Culture GRAM POSITIVE RODS  Final   Report Status PENDING  Incomplete  Blood culture (routine x 2)     Status: None (Preliminary result)   Collection Time: 07/28/20  7:56 PM   Specimen: BLOOD  Result Value Ref Range Status   Specimen Description BLOOD RIGHT HAND  Final   Special Requests   Final    BOTTLES DRAWN AEROBIC AND ANAEROBIC Blood Culture results may not be optimal due to an inadequate volume of blood received in culture bottles   Culture   Final    NO GROWTH < 12 HOURS Performed at [facility], [place],  [place], [place] [address]    Report Status PENDING  Incomplete         Radiology Studies: CT Head Wo Contrast  Result Date: 07/28/2020 CLINICAL DATA:  Mental status changes. EXAM: CT HEAD WITHOUT CONTRAST TECHNIQUE: Contiguous axial images were obtained from the base of the skull through the vertex without intravenous contrast. COMPARISON:  MRI 06/26/2020.  CT scan 06/25/2020. FINDINGS: Brain: There is no evidence for acute hemorrhage, hydrocephalus, mass lesion, or abnormal extra-axial fluid collection. No definite CT evidence for acute infarction. Diffuse loss of parenchymal volume is consistent with atrophy. Patchy low attenuation in the deep hemispheric and periventricular white matter is nonspecific, but likely reflects chronic microvascular ischemic demyelination. Vascular: No hyperdense vessel or unexpected calcification. Skull: No evidence for fracture. No worrisome lytic or sclerotic lesion. Sinuses/Orbits: The visualized paranasal sinuses and mastoid air cells are clear. Visualized portions of the globes and intraorbital fat are unremarkable. Other: None. IMPRESSION: 1. No acute intracranial abnormality. 2. Atrophy with chronic small vessel white matter ischemic disease. Electronically Signed   By: Misty Stanley M.D.   On: 07/28/2020 13:35   DG Chest Portable 1 View  Result Date: 07/28/2020 CLINICAL DATA:  86 year old with acute mental status changes and open coil bore responsiveness". Current history of CHF, hypertension. EXAM: PORTABLE CHEST 1 VIEW COMPARISON:  06/25/2020 and earlier, including CT chest 11/30/2019. FINDINGS: Cardiac silhouette moderately to markedly enlarged for AP portable technique, unchanged. Pulmonary venous hypertension and likely mild interstitial pulmonary edema. No confluent airspace consolidation. No visible pleural effusions. IMPRESSION: Mild CHF and/or fluid overload is suspected, with stable moderate to marked cardiomegaly. Electronically Signed   By: Evangeline Dakin M.D.   On: 07/28/2020 17:00        Scheduled Meds: . atorvastatin  80 mg Oral QHS  . chlorthalidone  25 mg Oral Daily  . divalproex  500 mg Oral Daily  . ferrous sulfate  325 mg Oral Q breakfast  . gabapentin  300 mg Oral TID  . insulin aspart  0-15 Units Subcutaneous TID WC  . insulin aspart  0-5 Units Subcutaneous QHS  . insulin glargine  15 Units Subcutaneous QHS  . isosorbide mononitrate  60 mg Oral Daily  . metoprolol succinate  25 mg Oral Q breakfast  . potassium chloride SA  20 mEq Oral BID  . Rivaroxaban  15 mg Oral Q supper   Continuous Infusions:   LOS: 0 days    Time spent: 44 min    Desma Maxim, MD [facility]   If 7PM-7AM, please contact night-coverage [email or web address] Password [facility] 07/29/2020, 7:43 AM

## 2020-07-29 NOTE — TOC Progression Note (Signed)
Transition of Care [facility]) - Progression Note    Patient Details  Name: [name] MRN: [id number] Date of Birth: 1934-12-28  Transition of Care [facility]) CM/SW Contact  Kerin Salen, RN Phone Number: 07/29/2020, 12:25 PM  Clinical Narrative:  [name] with [facility] Admissions representative Magda Paganini, [phone], who confirmed that all arrangements with son was made for patient to move in with them today, 07/29/20. If medically stable patient can move in today as planned.         Expected Discharge Plan and Services                                                 Social Determinants of Health (SDOH) Interventions    Readmission Risk Interventions Readmission Risk Prevention Plan 03/07/2020 02/12/2019 12/25/2018  Transportation Screening Complete - Complete  PCP or Specialist Appt within 3-5 Days Complete - Complete  HRI or Home Care Consult Complete - Complete  Social Work Consult for [place] Planning/Counseling Complete - Complete  Palliative Care Screening Not Applicable - Not Applicable  Medication Review [employer] photographer) Complete - Complete  PCP or Specialist appointment within 3-5 days of discharge - Not Complete -  PCP/Specialist Appt Not Complete comments - not ready for dc, family requests SNF placement -  HRI or Home Care Consult - Complete -  SW Recovery Care/Counseling Consult - Complete -  Palliative Care Screening - Not Applicable -  [place] - Complete -  Some recent data might be hidden

## 2020-07-29 NOTE — Evaluation (Signed)
Occupational Therapy Evaluation Patient Details Name: [name] MRN: [id number] DOB: [birth year] Today's Date: 07/29/2020    History of Present Illness Thiago Ragsdale is a 86 y.o. male with medical history significant for DM, HTN, PAD status post bilateral BKA, CAD, diastolic heart failure, CKD stage IIIa, paroxysmal A. fib on Xarelto, seizure disorder, history of UTIs, chronic pain on chronic opiates, hospitalized from 2/2-2/3 with altered mental status believed related to unintentional opiate overdose with suspected underlying dementia, and recently seen in the ER on 2/22 with dysuria with urine culture showing less than 10,000 colonies, who was brought to the emergency room with altered mental status, initially showing response to Narcan but with fluctuating mental status after several hours of observation in the emergency room.  History is limited due to persistent confusion.  Patient was apparently found to have decreased responsiveness from his baseline on arrival of his home health nurse and EMS was activated   Clinical Impression   Pt was seen for OT evaluation this date. Pt received alone in room, agreeable to OT. Pt noted to perseverate on concern over falling asleep and not waking up but with reassurance from OT and RN along with cues to redirect, pt able to participate in session relatively well. Per pt report, prior to hospital admission, pt was having increasing difficulty with ADL transfers and mobility. Pt reports his spouse assists him with bathing, meals, med mgt, cleaning, and transportation. Pt reports only recently having difficulty with managing bilateral BKA prosthetics. Currently pt demonstrates impairments as described below (See OT problem list) which functionally limit his ability to perform ADL/self-care tasks. Pt currently requires +2 for ADL transfer attempts, at least MOD A for LB ADL tasks in sitting, and S-MIN A for bed mobility. Pt instructed in calming  strategies including redirection/distraction and pursed lip breathing. Pt able to utilize with multimodal cues but unable to sustain without cues from therapist. Pt would benefit from skilled OT services to address noted impairments and functional limitations (see below for any additional details) in order to maximize safety and independence while minimizing falls risk and caregiver burden. Given change in functional status, upon hospital discharge, recommend STR to maximize pt safety and return to PLOF.     Follow Up Recommendations  SNF    Equipment Recommendations  None recommended by OT    Recommendations for Other Services       Precautions / Restrictions Precautions Precautions: Fall Restrictions Weight Bearing Restrictions: No      Mobility Bed Mobility Overal bed mobility: Needs Assistance Bed Mobility: Supine to Sit;Sit to Supine     Supine to sit: Min guard Sit to supine: Min assist   General bed mobility comments: Min A BLE mgt    Transfers Overall transfer level: Needs assistance   Transfers: Sit to/from Stand Sit to Stand: From elevated surface         General transfer comment: deferred    Balance Overall balance assessment: Needs assistance Sitting-balance support: No upper extremity supported;Feet unsupported Sitting balance-Leahy Scale: Fair Sitting balance - Comments: fait balance donning prosthetics while sitting edge of bed Postural control: Posterior lean     Standing balance comment: unable                           ADL either performed or assessed with clinical judgement   ADL Overall ADL's : Needs assistance/impaired  General ADL Comments: Pt currently requires MOD A for LB ADL while seated EOB, PRN Min A for UB ADL tasks. Cues to redirect at times. Unable to perform ADL transfers with 1 person assist.     Vision Patient Visual Report: No change from baseline        Perception     Praxis      Pertinent Vitals/Pain Pain Assessment: No/denies pain     Hand Dominance Right   Extremity/Trunk Assessment Upper Extremity Assessment Upper Extremity Assessment: Overall WFL for tasks assessed (grossly at least 4+/5 bilat)   Lower Extremity Assessment Lower Extremity Assessment: Generalized weakness (hx bilat BKAs, pt reports L BKA ~40mo ago and R BKA ~78mo ago, has prostheses)       Communication Communication Communication: HOH   Cognition Arousal/Alertness: Awake/alert Behavior During Therapy: WFL for tasks assessed/performed Overall Cognitive Status: No family/caregiver present to determine baseline cognitive functioning                                 General Comments: pt alert and perseverates on fear of falling asleep and not waking up 2/2 his son telling him it could happen (per pt report, no family present), requires cues to redirect   General Comments       Exercises Other Exercises Other Exercises: Pt educated in strategies to support calm and decrease his HR (130's-150's at time of eval, Afib, RN cleared for therapy) including pursed lip breathing and distraction techniques; pt able to trial but requiring cues to continue/redirect   Shoulder Instructions      Home Living Family/patient expects to be discharged to:: Skilled nursing facility (pt from home but expects to discharge to SNF for rehab prior to return) Living Arrangements: Spouse/significant other Available Help at Discharge: Family;Available 24 hours/day Type of Home: House Home Access: Ramped entrance     Home Layout: One level     Bathroom Shower/Tub: Occupational psychologist: Standard Bathroom Accessibility: Yes   Home Equipment: Cane - single point;Walker - 2 wheels;Grab bars - toilet;Wheelchair - manual;Shower seat   Additional Comments: Patient reports he was not walking recently, mostly transferring. Unsure of accuracy of info.       Prior Functioning/Environment Level of Independence: Needs assistance  Gait / Transfers Assistance Needed: B LE prosthetics, requires assistance for transfers chair to wheechair; pt reports until recently he was able to manage his prosthetics without assist - no family to verify info ADL's / Homemaking Assistance Needed: requires assistance for bathing, med mgt, meals, cleaning, and transportation from spouse per pt report   Comments: Patient limited historian, will verify with family as available.  Does suggest WC level as primary mobility, scoot/SB transfer to/from seating surfaces; partial standing (pulling on grab bar) to manage clothing with toilet needs.  Utilizes bilat UE/LEs to self-propel manual WC.  Does endose fall history; unable to quantify. From prior admission.        OT Problem List: Decreased strength;Decreased cognition;Decreased knowledge of use of DME or AE;Impaired balance (sitting and/or standing);Decreased activity tolerance      OT Treatment/Interventions: Self-care/ADL training;Therapeutic exercise;Therapeutic activities;DME and/or AE instruction;Patient/family education;Balance training;Cognitive remediation/compensation    OT Goals(Current goals can be found in the care plan section) Acute Rehab OT Goals Patient Stated Goal: to get stronger OT Goal Formulation: With patient Time For Goal Achievement: 08/12/20 Potential to Achieve Goals: Good ADL Goals Pt Will Perform Lower Body Dressing:  with min assist;sit to/from stand Pt Will Transfer to Toilet: with mod assist;bedside commode;ambulating (LRAD) Additional ADL Goal #1: Pt will follow 2-step instructions for sequencing in order to perform ADL tasks and maximize safety/minimize falls risk during ADL.  OT Frequency: Min 1X/week   Barriers to D/C:            Co-evaluation              AM-PAC OT "6 Clicks" Daily Activity     Outcome Measure Help from another person eating meals?: None Help from  another person taking care of personal grooming?: A Little Help from another person toileting, which includes using toliet, bedpan, or urinal?: Total Help from another person bathing (including washing, rinsing, drying)?: A Lot Help from another person to put on and taking off regular upper body clothing?: A Little Help from another person to put on and taking off regular lower body clothing?: A Lot 6 Click Score: 15   End of Session    Activity Tolerance: Patient tolerated treatment well Patient left: in bed;with bed alarm set;with call bell/phone within reach  OT Visit Diagnosis: Other abnormalities of gait and mobility (R26.89);Other symptoms and signs involving cognitive function;Muscle weakness (generalized) (M62.81)                Time: 0600-4599 OT Time Calculation (min): 20 min Charges:  OT General Charges $OT Visit: 1 Visit OT Evaluation $OT Eval High Complexity: 1 High OT Treatments $Therapeutic Activity: 8-22 mins  Hanley Hays, MPH, MS, OTR/L ascom [phone] 07/29/20, 3:41 PM

## 2020-07-30 DIAGNOSIS — I480 Paroxysmal atrial fibrillation: Secondary | ICD-10-CM | POA: Diagnosis not present

## 2020-07-30 LAB — GLUCOSE, CAPILLARY
Glucose-Capillary: 85 mg/dL (ref 70–99)
Glucose-Capillary: 163 mg/dL — ABNORMAL HIGH (ref 70–99)
Glucose-Capillary: 195 mg/dL — ABNORMAL HIGH (ref 70–99)
Glucose-Capillary: 162 mg/dL — ABNORMAL HIGH (ref 70–99)

## 2020-07-30 LAB — COMPREHENSIVE METABOLIC PANEL
ALT: 24 U/L (ref 0–44)
AST: 32 U/L (ref 15–41)
Albumin: 3.3 g/dL — ABNORMAL LOW (ref 3.5–5.0)
Alkaline Phosphatase: 93 U/L (ref 38–126)
Anion gap: 10 (ref 5–15)
BUN: 44 mg/dL — ABNORMAL HIGH (ref 8–23)
CO2: 26 mmol/L (ref 22–32)
Calcium: 8.7 mg/dL — ABNORMAL LOW (ref 8.9–10.3)
Chloride: 105 mmol/L (ref 98–111)
Creatinine, Ser: 1.45 mg/dL — ABNORMAL HIGH (ref 0.61–1.24)
GFR, Estimated: 48 mL/min — ABNORMAL LOW (ref >60)
Glucose, Bld: 97 mg/dL (ref 70–99)
Potassium: 4.2 mmol/L (ref 3.5–5.1)
Sodium: 141 mmol/L (ref 135–145)
Total Bilirubin: 0.6 mg/dL (ref 0.3–1.2)
Total Protein: 7 g/dL (ref 6.5–8.1)

## 2020-07-30 LAB — URINE CULTURE: Culture: NO GROWTH

## 2020-07-30 LAB — CBC
HCT: 43.7 % (ref 39.0–52.0)
Hemoglobin: 14.4 g/dL (ref 13.0–17.0)
MCH: 29.6 pg (ref 26.0–34.0)
MCHC: 33 g/dL (ref 30.0–36.0)
MCV: 89.7 fL (ref 80.0–100.0)
Platelets: 231 10*3/uL (ref 150–400)
RBC: 4.87 MIL/uL (ref 4.22–5.81)
RDW: 13.8 % (ref 11.5–15.5)
WBC: 9.6 10*3/uL (ref 4.0–10.5)
nRBC: 0 % (ref 0.0–0.2)

## 2020-07-30 MED ORDER — DILTIAZEM HCL 30 MG PO TABS: 30.0000 mg | ORAL_TABLET | Freq: Three times a day (TID) | ORAL | Status: DC | PRN

## 2020-07-30 NOTE — Progress Notes (Signed)
OT Cancellation Note  Patient Details Name: [name] MRN: [id number] DOB: [birth year]   Cancelled Treatment:    Reason Eval/Treat Not Completed: Medical issues which prohibited therapy. OT intervention held this date secondary to tele AF c RVR, consistently in 130sBPM. OT will re-attempt when pt is medically able to participate in OT intervention.   Darleen Crocker, [place], OTR/L , CBIS ascom [phone]  07/30/20, 11:26 AM   07/30/2020, 11:24 AM

## 2020-07-30 NOTE — Progress Notes (Signed)
[facility] Progress Note  Patient: [name]    YQM:[id number]  DOA: 07/28/2020     Date of Service: the patient was seen and examined on 07/30/2020  No chief complaint on file.  Brief hospital course: From admission hpi: Travius Crochet a 85 y.o.malewith medical history significant forDM, HTN, PAD status post bilateral BKA, CAD, diastolic heart failure, CKD stage IIIa, paroxysmal A. fib on Xarelto, seizure disorder, history of UTIs, chronic pain on chronic opiates, hospitalized from 2/2-2/3 with altered mental status believed related to unintentional opiate overdose with suspected underlying dementia, and recently seen in the ER on 2/22 with dysuria with urine culture showing less than 10,000 colonies, who was brought to the emergency room with altered mental status, initially showing response to Narcan but with fluctuating mental status after several hours of observation in the emergency room. History is limited due to persistent confusion. Patient was apparently found to have decreased responsiveness from his baseline on arrival of his home health nurse and EMS was activated Patient was monitored for several hours in the ER but continued to have intermittent confusion and somnolence. Hospitalist consulted for admission.    Assessment and Plan: Active Problems:   S/P bilateral BKA (below knee amputation) (HCC)   Paroxysmal A-fib (HCC)   Seizure disorder (HCC)   CAD (coronary artery disease)   Acute toxic-metabolic encephalopathy   (HFpEF) heart failure with preserved ejection fraction (HCC)   AMS (altered mental status)   Chronic anticoagulation   Chronic kidney disease, stage 3a (HCC)   Lactic acidosis   Hypoxic   Chronic prescription opiate use   Elevated troponin  # Acutetoxic metabolic encephalopathy Appears to have resolved this morning. Hospitalized for similar one month ago, then worked up with MRI and EEG which were both negative. Has had  rapid improvement overnight. Previously thought to be 2/2 opioids but does not on opioids at home currently. Home gabapentin may contribute. Presented with glucose of 67 so hypoglycemia may cause/contribute. Was hypoxic (resolved), which can contribute to encephalopathy. No electrolyte abnormalities or report of seizure-like activity. No cough or pulmonary infiltrate to suggest pneumonia. No dysuria and ua does not appear infected. Denies drug/alcohol use. CT head without acute findings. No clear septic picture. 1/4 bottles growing GPRs (discussed w/ ID, likely contaminant). Lactate elevated, appears to be chronic?  -  UDS negative - repeat lactate 2.6, cmp, cbc -  urine cx negative and blood cultures contamination as per ID -Continue seizure precautions  - decrease dose home gabapentin - has bed at [facility] (family had previously arranged to admit patient there). Pt/ot advising snf. Should be able to d/c there on 3/9   # Acute hypoxic respiratory failure Resolved rapidly  # Chronic a fib Here bradycardic initially so home dilt held. This PM RVR with HR 120-130s,   hemodynamically stable. - cont home metop, xarelto - add back home diltiazem, monitor 3/9 still HR is elevated so started Cardizem 30 mg p.o. every 8 hourly as needed if heart rate >130   # Elevated troponin, h/o CAD  Chronically elevated, flat and stable, likely demand, no chest pain or ekg changes to suggest ischemia  #Chronic diastolic heart failure Non-obstructive CAD on cath couple of years ago. CXR with pulmonary congestion, bnp elevated (this is chornic), this morning off o2, breathing comfortably, no edema - cont home metop, chlorthalidone, imdur - monitor  # Seizure disorcer Recent eeg neg for seizure - cont home depakote, reduce dose gabapentin - seizure precautions  #  CKD3 Stable - monitor  # T2DM Hypoglycemia on presentation, resolved - cont home lantus 15 qhs - hold home met and novolog -  SSI  # HTN Here bp wnl - cont home chlorthalidone, metoprolol, atorvastatin  # PAD (peripheral artery disease) Status post bilateral BKA, stumps clean - cont home atorva, xarelt  # OSA Not on cpap at home   Body mass index is 30.59 kg/m.  Nutrition Problem: Inadequate oral intake Etiology: acute illness Interventions:       Diet: Heart healthy/carb modified DVT Prophylaxis: Therapeutic Anticoagulation with Xarelto   Advance goals of care discussion: DNR  Family Communication: family was NOT present at bedside, at the time of interview.  The pt provided permission to discuss medical plan with the family. Opportunity was given to ask question and all questions were answered satisfactorily.   Disposition:  Pt is from home, admitted with hypoglycemia, A. fib with RVR, acute metabolic encephalopathy, still has A. fib with RVR, which precludes a safe discharge. Discharge to SNF, when A. fib with RVR will be controlled, most likely tomorrow.  Subjective: No overnight issues, patient denied any active issues, denied any chest pain or palpitations, no abdominal pain.  Physical Exam: General:  alert and awake, NAD.  Appear in no distress, affect appropriate Eyes: PERRLA ENT: Oral Mucosa Clear, moist  Neck: no JVD,  Cardiovascular: S1 and S2 Present, no Murmur, irregular rhythm Respiratory: good respiratory effort, Bilateral Air entry equal, no Crackles, no wheezes Abdomen: Bowel Sound present, Soft and no tenderness,  Skin: no rashes Extremities: s/p BL BKA  Neurologic: without any new focal findings   Vitals:   07/30/20 0356 07/30/20 0801 07/30/20 1001 07/30/20 1125  BP: 122/64 123/89  100/85  Pulse: 100 (!) 56 (!) 102 81  Resp: '17 17  17  ' Temp: 98.4 F (36.9 C) 98.1 F (36.7 C)  98.6 F (37 C)  TempSrc: Oral Oral  Oral  SpO2: 90% 97%  95%  Weight: 88.6 kg     Height:        Intake/Output Summary (Last 24 hours) at 07/30/2020 1315 Last data filed at 07/30/2020  0955 Gross per 24 hour  Intake 960 ml  Output 1100 ml  Net -140 ml   Filed Weights   07/28/20 1247 07/28/20 2212 07/30/20 0356  Weight: 90.9 kg 88.5 kg 88.6 kg    Data Reviewed: I have personally reviewed and interpreted daily labs, tele strips, imagings as discussed above. I reviewed all nursing notes, pharmacy notes, vitals, pertinent old records I have discussed plan of care as described above with RN and patient/family.  CBC: Recent Labs  Lab 07/28/20 1255 07/29/20 1009 07/30/20 0523  WBC 7.4 8.7 9.6  NEUTROABS 4.1  --   --   HGB 13.3 14.0 14.4  HCT 39.5 42.5 43.7  MCV 89.8 90.8 89.7  PLT 211 224 324   Basic Metabolic Panel: Recent Labs  Lab 07/28/20 1255 07/29/20 1009 07/30/20 0523  NA 142 139 141  K 4.2 4.7 4.2  CL 109 102 105  CO2 '25 27 26  ' GLUCOSE 79 236* 97  BUN 40* 40* 44*  CREATININE 1.62* 1.62* 1.45*  CALCIUM 8.5* 8.7* 8.7*  MG 2.2  --   --     Studies: No results found.  Scheduled Meds: . atorvastatin  80 mg Oral QHS  . chlorthalidone  25 mg Oral Daily  . diltiazem  120 mg Oral Daily  . divalproex  500 mg Oral Daily  .  ferrous sulfate  325 mg Oral Q breakfast  . gabapentin  100 mg Oral TID  . insulin aspart  0-15 Units Subcutaneous TID WC  . insulin aspart  0-5 Units Subcutaneous QHS  . insulin glargine  15 Units Subcutaneous QHS  . isosorbide mononitrate  60 mg Oral Daily  . metoprolol succinate  25 mg Oral Q breakfast  . multivitamin with minerals  1 tablet Oral Daily  . potassium chloride SA  20 mEq Oral BID  . Ensure Max Protein  11 oz Oral BID  . Rivaroxaban  15 mg Oral Q supper   Continuous Infusions: PRN Meds: acetaminophen **OR** acetaminophen, metoprolol tartrate, ondansetron **OR** ondansetron (ZOFRAN) IV  Time spent: 35 minutes  Author: Val Riles. MD [facility] 07/30/2020 1:15 PM  To reach On-call, see care teams to locate the attending and reach out to them via [email or web address]. If 7PM-7AM, please contact  night-coverage If you still have difficulty reaching the attending provider, please page the [facility] (Director on Call) for [facility] on amion for assistance.

## 2020-07-30 NOTE — Progress Notes (Signed)
PT Cancellation Note  Patient Details Name: [name] MRN: [id number] DOB: 09-25-1934   Cancelled Treatment:    Reason Eval/Treat Not Completed: Medical issues which prohibited therapy (Holding treatment at this time- per tele display rates elevated, in AF c RVR, consistently in 130sBPM. Will resume services when appropriate.)   Buccola,Allan C 07/30/2020, 10:03 AM

## 2020-07-30 NOTE — NC FL2 (Signed)
[place] LEVEL OF CARE SCREENING TOOL     IDENTIFICATION  Patient Name: [name] Birthdate: 30-Mar-1935 Sex: male Admission Date (Current Location): 07/28/2020  [place] and [place] Number:  Engineering geologist and Address:  [facility], [address], [place], [place] [address]      Provider Number: [id number]  Attending Physician Name and Address:  Val Riles, MD  Relative Name and Phone Number:  Debra Calabretta [phone]    Current Level of Care: Hospital Recommended Level of Care: [place] Prior Approval Number:    Date Approved/Denied:   PASRR Number: [id number]  Discharge Plan: SNF    Current Diagnoses: Patient Active Problem List   Diagnosis Date Noted  . Acute encephalopathy 07/29/2020  . Lactic acidosis 07/28/2020  . Hypoxic 07/28/2020  . Chronic prescription opiate use 07/28/2020  . Elevated troponin 07/28/2020  . Chronic anticoagulation 06/25/2020  . Chronic kidney disease, stage 3a ([place]) 06/25/2020  . AMS (altered mental status) 04/08/2020  . CAD (coronary artery disease) 03/06/2020  . Personal history of COVID-19 03/06/2020  . Acute toxic-metabolic encephalopathy 29/52/8413  . UTI (urinary tract infection) 03/06/2020  . (HFpEF) heart failure with preserved ejection fraction ([place]) 03/06/2020  . Goals of care, counseling/discussion   . Palliative care by specialist   . Seizure disorder ([place]) 11/30/2019  . ACS (acute coronary syndrome) ([place]) 06/24/2019  . Sinus bradycardia on ECG 05/16/2019  . Paroxysmal A-fib ([place]) 03/27/2019  . Hx of BKA, right ([place]) 03/02/2019  . Severe protein-calorie malnutrition ([place]) 02/16/2019  . Acute systolic CHF (congestive heart failure) ([place]) 02/16/2019  . Acute respiratory failure with hypoxia ([place]) 02/08/2019  . Pneumonia due to COVID-19 virus 02/08/2019  . Below-knee amputation of right lower extremity ([place]) 01/15/2019  . Polyp of descending  colon   . Iron deficiency anemia due to chronic blood loss   . Sacral decubitus ulcer, stage II ([place]) 12/26/2018  . Acute respiratory failure ([place]) 12/20/2018  . Femur fracture ([place]) 10/13/2018  . Chest pain 06/03/2018  . NSTEMI (non-ST elevated myocardial infarction) ([place]) 01/16/2018  . Acute CHF (congestive heart failure) ([place]) 11/23/2017  . Atrial fibrillation with rapid ventricular response ([place]) 10/15/2017  . BKA stump complication ([place]) 24/40/1027  . 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 ([place]) 02/10/2017  . S/P bilateral BKA (below knee amputation) ([place]) 02/01/2017  . Chronic diastolic heart failure ([place]) 01/21/2017  . Pressure injury of skin 01/18/2017  . 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 Diabetic foot ulcer ([place]) 12/29/2016  . Ataxia 10/21/2016  . Atherosclerosis of native arteries of the extremities with ulceration ([place]) 10/12/2016  . History of femoral angiogram 09/06/2016  . Left leg pain 09/06/2016  . Leukocytosis 09/06/2016  . Generalized weakness 09/06/2016  . AKI (acute kidney injury) ([place]) 09/06/2016  . Atrial fibrillation, chronic ([place]) 08/30/2016  . Pneumonia 05/19/2016  . Hyperlipidemia 04/20/2016  . Back pain 04/19/2016  . Type 2 diabetes mellitus treated with insulin ([place]) 04/19/2016  . Essential hypertension 04/19/2016  . PAD (peripheral artery disease) ([place]) 04/19/2016  . Erectile dysfunction following radical prostatectomy 06/25/2015  . History of prostate cancer 12/23/2014  . Incontinence 12/23/2014    Orientation RESPIRATION BLADDER Height & Weight     Self  Normal Continent Weight: 88.6 kg Height:  5\' 7"  (170.2 cm)  BEHAVIORAL SYMPTOMS/MOOD NEUROLOGICAL BOWEL NUTRITION STATUS      Continent Diet  AMBULATORY STATUS COMMUNICATION OF NEEDS Skin   Limited  Assist Verbally Normal                       Personal Care Assistance Level of Assistance  Bathing,Feeding,Dressing Bathing  Assistance: Limited assistance Feeding assistance: Independent Dressing Assistance: Limited assistance     Functional Limitations Info  Sight,Hearing,Speech Sight Info: Adequate Hearing Info: Impaired (Hard of Hearing) Speech Info: Adequate    SPECIAL CARE FACTORS FREQUENCY  PT (By licensed PT),OT (By licensed OT)     PT Frequency: 5x week OT Frequency: 5x week            Contractures Contractures Info: Not present    Additional Factors Info  Code Status,Allergies Code Status Info: DNR Allergies Info: Iohexol, Metrizamide, Contrast Media, Latex           Current Medications (07/30/2020):  This is the current hospital active medication list Current Facility-Administered Medications  Medication Dose Route Frequency Provider Last Rate Last Admin  . acetaminophen (TYLENOL) tablet 650 mg  650 mg Oral Q6H PRN Athena Masse, MD       Or  . acetaminophen (TYLENOL) suppository 650 mg  650 mg Rectal Q6H PRN Athena Masse, MD      . atorvastatin (LIPITOR) tablet 80 mg  80 mg Oral QHS Athena Masse, MD   80 mg at 07/29/20 2104  . chlorthalidone (HYGROTON) tablet 25 mg  25 mg Oral Daily Athena Masse, MD   25 mg at 07/29/20 0840  . diltiazem (CARDIZEM CD) 24 hr capsule 120 mg  120 mg Oral Daily Gwynne Edinger, MD   120 mg at 07/29/20 1509  . divalproex (DEPAKOTE) DR tablet 500 mg  500 mg Oral Daily Athena Masse, MD   500 mg at 07/29/20 0841  . ferrous sulfate tablet 325 mg  325 mg Oral Q breakfast Athena Masse, MD   325 mg at 07/29/20 0839  . gabapentin (NEURONTIN) capsule 100 mg  100 mg Oral TID Gwynne Edinger, MD   100 mg at 07/29/20 2104  . insulin aspart (novoLOG) injection 0-15 Units  0-15 Units Subcutaneous TID WC Athena Masse, MD   5 Units at 07/29/20 1222  . insulin aspart (novoLOG) injection 0-5 Units  0-5 Units Subcutaneous QHS Judd Gaudier V, MD      . insulin glargine (LANTUS) injection 15 Units  15 Units Subcutaneous QHS Athena Masse, MD   15  Units at 07/29/20 2104  . isosorbide mononitrate (IMDUR) 24 hr tablet 60 mg  60 mg Oral Daily Athena Masse, MD   60 mg at 07/29/20 0841  . metoprolol succinate (TOPROL-XL) 24 hr tablet 25 mg  25 mg Oral Q breakfast Athena Masse, MD   25 mg at 07/29/20 0839  . metoprolol tartrate (LOPRESSOR) injection 5 mg  5 mg Intravenous Q5 min PRN Wouk, Ailene Rud, MD   5 mg at 07/29/20 1832  . multivitamin with minerals tablet 1 tablet  1 tablet Oral Daily Wouk, Ailene Rud, MD      . ondansetron [facility]) tablet 4 mg  4 mg Oral Q6H PRN Athena Masse, MD       Or  . ondansetron [facility]) injection 4 mg  4 mg Intravenous Q6H PRN Athena Masse, MD      . potassium chloride SA (KLOR-CON) CR tablet 20 mEq  20 mEq Oral BID Athena Masse, MD   20 mEq at 07/29/20 2104  . protein supplement (ENSURE MAX)  liquid  11 oz Oral BID Gwynne Edinger, MD   11 oz at 07/29/20 2105  . Rivaroxaban (XARELTO) tablet 15 mg  15 mg Oral Q supper Athena Masse, MD   15 mg at 07/29/20 1756     Discharge Medications: Please see discharge summary for a list of discharge medications.  Relevant Imaging Results:  Relevant Lab Results:   Additional Information Bilateral LE Amputee with prosthetics. ss# [id number]  Kerin Salen, RN

## 2020-07-31 DIAGNOSIS — G9341 Metabolic encephalopathy: Secondary | ICD-10-CM | POA: Diagnosis not present

## 2020-07-31 DIAGNOSIS — I480 Paroxysmal atrial fibrillation: Secondary | ICD-10-CM | POA: Diagnosis not present

## 2020-07-31 LAB — COMPREHENSIVE METABOLIC PANEL
ALT: 22 U/L (ref 0–44)
AST: 26 U/L (ref 15–41)
Albumin: 3.1 g/dL — ABNORMAL LOW (ref 3.5–5.0)
Alkaline Phosphatase: 85 U/L (ref 38–126)
Anion gap: 7 (ref 5–15)
BUN: 46 mg/dL — ABNORMAL HIGH (ref 8–23)
CO2: 26 mmol/L (ref 22–32)
Calcium: 8.6 mg/dL — ABNORMAL LOW (ref 8.9–10.3)
Chloride: 106 mmol/L (ref 98–111)
Creatinine, Ser: 1.44 mg/dL — ABNORMAL HIGH (ref 0.61–1.24)
GFR, Estimated: 48 mL/min — ABNORMAL LOW (ref >60)
Glucose, Bld: 105 mg/dL — ABNORMAL HIGH (ref 70–99)
Potassium: 4.5 mmol/L (ref 3.5–5.1)
Sodium: 139 mmol/L (ref 135–145)
Total Bilirubin: 0.9 mg/dL (ref 0.3–1.2)
Total Protein: 6.7 g/dL (ref 6.5–8.1)

## 2020-07-31 LAB — CBC
HCT: 44.9 % (ref 39.0–52.0)
Hemoglobin: 15.2 g/dL (ref 13.0–17.0)
MCH: 30.2 pg (ref 26.0–34.0)
MCHC: 33.9 g/dL (ref 30.0–36.0)
MCV: 89.1 fL (ref 80.0–100.0)
Platelets: 202 10*3/uL (ref 150–400)
RBC: 5.04 MIL/uL (ref 4.22–5.81)
RDW: 14.2 % (ref 11.5–15.5)
WBC: 7.8 10*3/uL (ref 4.0–10.5)
nRBC: 0 % (ref 0.0–0.2)

## 2020-07-31 LAB — MAGNESIUM: Magnesium: 2.1 mg/dL (ref 1.7–2.4)

## 2020-07-31 LAB — GLUCOSE, CAPILLARY
Glucose-Capillary: 185 mg/dL — ABNORMAL HIGH (ref 70–99)
Glucose-Capillary: 265 mg/dL — ABNORMAL HIGH (ref 70–99)
Glucose-Capillary: 154 mg/dL — ABNORMAL HIGH (ref 70–99)
Glucose-Capillary: 103 mg/dL — ABNORMAL HIGH (ref 70–99)

## 2020-07-31 LAB — PHOSPHORUS: Phosphorus: 3.7 mg/dL (ref 2.5–4.6)

## 2020-07-31 LAB — TROPONIN I (HIGH SENSITIVITY)
Troponin I (High Sensitivity): 31 ng/L — ABNORMAL HIGH (ref <18)
Troponin I (High Sensitivity): 34 ng/L — ABNORMAL HIGH (ref <18)
Troponin I (High Sensitivity): 40 ng/L — ABNORMAL HIGH (ref <18)

## 2020-07-31 MED ORDER — PANTOPRAZOLE SODIUM 40 MG IV SOLR
40.0000 mg | Freq: Once | INTRAVENOUS | Status: AC
  Administered 2020-07-31: 40 mg via INTRAVENOUS
  Filled 2020-07-31: qty 40

## 2020-07-31 MED ORDER — NITROGLYCERIN 0.4 MG SL SUBL: 0.4000 mg | SUBLINGUAL_TABLET | SUBLINGUAL | Status: DC | PRN

## 2020-07-31 MED ORDER — NITROGLYCERIN 0.4 MG SL SUBL
SUBLINGUAL_TABLET | SUBLINGUAL | Status: AC
  Filled 2020-07-31: qty 1

## 2020-07-31 MED ORDER — PANTOPRAZOLE SODIUM 40 MG PO TBEC
40.0000 mg | DELAYED_RELEASE_TABLET | Freq: Every day | ORAL | Status: DC
  Administered 2020-08-01 – 2020-08-03 (×3): 40 mg via ORAL
  Filled 2020-07-31 (×3): qty 1

## 2020-07-31 MED ORDER — ASPIRIN 81 MG PO CHEW
324.0000 mg | CHEWABLE_TABLET | Freq: Once | ORAL | Status: AC
  Administered 2020-07-31: 324 mg via ORAL
  Filled 2020-07-31: qty 4

## 2020-07-31 NOTE — Progress Notes (Signed)
PT Cancellation Note  Patient Details Name: Errick Salts MRN: [id number] DOB: December 02, 1934   Cancelled Treatment:    Reason Eval/Treat Not Completed: Medical issues which prohibited therapy (AF/RVR remains similar to previous day, 110s-140 bpm at rest, not appropriate to participate in exertion based activity or exercise at this time. Will resume services at later date/time.)   Terie Lear C 07/31/2020, 8:55 AM

## 2020-07-31 NOTE — TOC Progression Note (Addendum)
Transition of Care [facility]) - Progression Note    Patient Details  Name: [name] MRN: [id number] Date of Birth: 07-20-1934  Transition of Care [facility]) CM/SW Contact  Eileen Stanford, LCSW Phone Number: 07/31/2020, 10:44 AM  Clinical Narrative:   Everlene Balls offering a peer to peer. Must be completed by MD by 2:00. MD provided with call back information.  1:13- per MD peer to peer was denied due to medically instability. Pt getting worked up for chest pain and AFIB RVR. CSW will resubmit again once medically stable.    1:22- CSW received a call from pt's SW at [place], with Adult Protective Services. SW is Musu and her number is [phone]. Musu was inquiring about dc plans and ask that we do not d/c pt unless he is dc to [facility]. CSW explained pt is still getting medically worked up.    Expected Discharge Plan and Services                                                 Social Determinants of Health (SDOH) Interventions    Readmission Risk Interventions Readmission Risk Prevention Plan 03/07/2020 02/12/2019 12/25/2018  Transportation Screening Complete - Complete  PCP or Specialist Appt within 3-5 Days Complete - Complete  HRI or Home Care Consult Complete - Complete  Social Work Consult for [place] Planning/Counseling Complete - Complete  Palliative Care Screening Not Applicable - Not Applicable  Medication Review Press photographer) Complete - Complete  PCP or Specialist appointment within 3-5 days of discharge - Not Complete -  PCP/Specialist Appt Not Complete comments - not ready for dc, family requests SNF placement -  HRI or Home Care Consult - Complete -  SW Recovery Care/Counseling Consult - Complete -  Palliative Care Screening - Not Applicable -  [place] - Complete -  Some recent data might be hidden

## 2020-07-31 NOTE — Progress Notes (Signed)
[facility] Progress Note  Patient: [name]    BJY:[id number]  DOA: 07/28/2020     Date of Service: the patient was seen and examined on 07/31/2020  No chief complaint on file.  Brief hospital course: From admission hpi: Davari Lopes a 86 y.o.malewith medical history significant forDM, HTN, PAD status post bilateral BKA, CAD, diastolic heart failure, CKD stage IIIa, paroxysmal A. fib on Xarelto, seizure disorder, history of UTIs, chronic pain on chronic opiates, hospitalized from 2/2-2/3 with altered mental status believed related to unintentional opiate overdose with suspected underlying dementia, and recently seen in the ER on 2/22 with dysuria with urine culture showing less than 10,000 colonies, who was brought to the emergency room with altered mental status, initially showing response to Narcan but with fluctuating mental status after several hours of observation in the emergency room. History is limited due to persistent confusion. Patient was apparently found to have decreased responsiveness from his baseline on arrival of his home health nurse and EMS was activated Patient was monitored for several hours in the ER but continued to have intermittent confusion and somnolence. Hospitalist consulted for admission.    Assessment and Plan: Active Problems:   S/P bilateral BKA (below knee amputation) (HCC)   Paroxysmal A-fib (HCC)   Seizure disorder (HCC)   CAD (coronary artery disease)   Acute toxic-metabolic encephalopathy   (HFpEF) heart failure with preserved ejection fraction (HCC)   AMS (altered mental status)   Chronic anticoagulation   Chronic kidney disease, stage 3a (HCC)   Lactic acidosis   Hypoxic   Chronic prescription opiate use   Elevated troponin  # Acutetoxic metabolic encephalopathy, Resolved Pt was Hospitalized for similar one month ago, then worked up with MRI and EEG which were both negative. Has had rapid improvement  overnight. Previously thought to be 2/2 opioids but does not on opioids at home currently. Home gabapentin may contribute. Presented with glucose of 67 so hypoglycemia may cause/contribute. Was hypoxic (resolved), which can contribute to encephalopathy. No electrolyte abnormalities or report of seizure-like activity. No cough or pulmonary infiltrate to suggest pneumonia. No dysuria and ua does not appear infected. Denies drug/alcohol use. CT head without acute findings. No clear septic picture. 1/4 bottles growing GPRs (discussed w/ ID, likely contaminant). Lactate elevated, appears to be chronic?  -  UDS negative - repeat lactate 2.6, cmp, cbc -  urine cx negative and blood cultures contamination as per ID -Continue seizure precautions  - decrease dose home gabapentin - has bed at [facility] (family had previously arranged to admit patient there). Pt/ot advising snf. Should be able to d/c there on 3/9   # Acute hypoxic respiratory failure Resolved rapidly  # Chronic a fib Here bradycardic initially so home dilt held. This PM RVR with HR 120-130s,   hemodynamically stable. - cont home metop, xarelto - add back home diltiazem, monitor 3/9 still HR is elevated so started Cardizem 30 mg p.o. every 8 hourly as needed if heart rate >130 3/10 consulted cardiology due to persistent A. fib with RVR and patient complaining of chest pain  # Elevated troponin, h/o CAD  Chronically elevated, flat and stable, likely demand, no chest pain or ekg changes to suggest ischemia 3/10 complaining of chest pain, required nitroglycerin, and aspirin 324x1 given, started oral aspirin Trop chronically elevated Follow 2D echocardiogram and possible ischemic work-up Pending cardiology consult   #Chronic diastolic heart failure Non-obstructive CAD on cath couple of years ago. CXR with pulmonary congestion,  bnp elevated (this is chornic), this morning off o2, breathing comfortably, no edema - cont home metop,  chlorthalidone, imdur - monitor  # Seizure disorcer Recent eeg neg for seizure - cont home depakote, reduce dose gabapentin - seizure precautions  # CKD3 Stable - monitor  # T2DM Hypoglycemia on presentation, resolved - cont home lantus 15 qhs - hold home met and novolog - SSI  # HTN Here bp wnl - cont home chlorthalidone, metoprolol, atorvastatin  # PAD (peripheral artery disease) Status post bilateral BKA, stumps clean - cont home atorva, xarelt  # OSA Not on cpap at home   Body mass index is 30.59 kg/m.  Nutrition Problem: Inadequate oral intake Etiology: acute illness Interventions:       Diet: Heart healthy/carb modified DVT Prophylaxis: Therapeutic Anticoagulation with Xarelto   Advance goals of care discussion: DNR  Family Communication: family was NOT present at bedside, at the time of interview.  The pt provided permission to discuss medical plan with the family. Opportunity was given to ask question and all questions were answered satisfactorily.   Disposition:  Pt is from home, admitted with hypoglycemia, A. fib with RVR, acute metabolic encephalopathy, still has A. fib with RVR, which precludes a safe discharge. 3/10 complain of chest pain, still in A. fib with RVR Cardiology consulted, awaiting for recommendation and then we will plan for disposition 3/10 peer-to-peer was done, insurance denied SNF placement due to medically unstable at this time   Subjective: No overnight issues, in the morning time patient was complaining of midsternal chest pain, no radiation, patient wanted nitroglycerin which was given an aspirin was given, troponin remained at lower end, chronically elevated.  Patient was still in A. fib with RVR, cardiology consulted.    Physical Exam: General:  alert and awake, c/o chest pain and he was requesting nitroglycerin Appear in no distress, affect appropriate Eyes: PERRLA ENT: Oral Mucosa Clear, moist  Neck: no JVD,   Cardiovascular: S1 and S2 Present, no Murmur, irregular rhythm Respiratory: good respiratory effort, Bilateral Air entry equal, no Crackles, no wheezes Abdomen: Bowel Sound present, Soft and no tenderness,  Skin: no rashes Extremities: s/p BL BKA  Neurologic: without any new focal findings   Vitals:   07/31/20 0816 07/31/20 0932 07/31/20 0949 07/31/20 1148  BP: 128/83 (!) 126/98  108/84  Pulse: 81 (!) 131 (!) 128 62  Resp: 18   19  Temp: 98 F (36.7 C)   97.9 F (36.6 C)  TempSrc: Oral     SpO2: 93% 99%  91%  Weight:      Height:        Intake/Output Summary (Last 24 hours) at 07/31/2020 1206 Last data filed at 07/31/2020 0950 Gross per 24 hour  Intake 240 ml  Output 300 ml  Net -60 ml   Filed Weights   07/28/20 2212 07/30/20 0356 07/31/20 0451  Weight: 88.5 kg 88.6 kg 88.5 kg    Data Reviewed: I have personally reviewed and interpreted daily labs, tele strips, imagings as discussed above. I reviewed all nursing notes, pharmacy notes, vitals, pertinent old records I have discussed plan of care as described above with RN and patient/family.  CBC: Recent Labs  Lab 07/28/20 1255 07/29/20 1009 07/30/20 0523 07/31/20 0533  WBC 7.4 8.7 9.6 7.8  NEUTROABS 4.1  --   --   --   HGB 13.3 14.0 14.4 15.2  HCT 39.5 42.5 43.7 44.9  MCV 89.8 90.8 89.7 89.1  PLT 211 224  231 161   Basic Metabolic Panel: Recent Labs  Lab 07/28/20 1255 07/29/20 1009 07/30/20 0523 07/31/20 0533  NA 142 139 141 139  K 4.2 4.7 4.2 4.5  CL 109 102 105 106  CO2 '25 27 26 26  ' GLUCOSE 79 236* 97 105*  BUN 40* 40* 44* 46*  CREATININE 1.62* 1.62* 1.45* 1.44*  CALCIUM 8.5* 8.7* 8.7* 8.6*  MG 2.2  --   --  2.1  PHOS  --   --   --  3.7    Studies: No results found.  Scheduled Meds: . atorvastatin  80 mg Oral QHS  . chlorthalidone  25 mg Oral Daily  . diltiazem  120 mg Oral Daily  . divalproex  500 mg Oral Daily  . ferrous sulfate  325 mg Oral Q breakfast  . gabapentin  100 mg Oral TID   . insulin aspart  0-15 Units Subcutaneous TID WC  . insulin aspart  0-5 Units Subcutaneous QHS  . insulin glargine  15 Units Subcutaneous QHS  . isosorbide mononitrate  60 mg Oral Daily  . metoprolol succinate  25 mg Oral Q breakfast  . multivitamin with minerals  1 tablet Oral Daily  . [START ON 08/01/2020] pantoprazole  40 mg Oral QHS  . potassium chloride SA  20 mEq Oral BID  . Ensure Max Protein  11 oz Oral BID  . Rivaroxaban  15 mg Oral Q supper   Continuous Infusions: PRN Meds: acetaminophen **OR** acetaminophen, diltiazem, metoprolol tartrate, nitroGLYCERIN, ondansetron **OR** ondansetron (ZOFRAN) IV  Time spent: 35 minutes  Author: Val Riles. MD [facility] 07/31/2020 12:06 PM  To reach On-call, see care teams to locate the attending and reach out to them via [email or web address]. If 7PM-7AM, please contact night-coverage If you still have difficulty reaching the attending provider, please page the [facility] (Director on Call) for [facility] on amion for assistance.

## 2020-07-31 NOTE — Progress Notes (Signed)
OT Cancellation Note  Patient Details Name: [name] MRN: [id number] DOB: [birth year]   Cancelled Treatment:    Reason Eval/Treat Not Completed: Medical issues which prohibited therapy. Chart reviewed. AF/RVR remains similar to previous day, 110s-140 bpm at rest, not appropriate to participate in exertion based activity or exercise at this time. Will resume services at later date/time.  Hanley Hays, MPH, MS, OTR/L ascom [phone] 07/31/20, 9:11 AM

## 2020-07-31 NOTE — TOC Progression Note (Signed)
Transition of Care [facility]) - Progression Note    Patient Details  Name: Marico Buckle MRN: [id number] Date of Birth: 24-Dec-1934  Transition of [facility]) CM/SW [place], RN Phone Number: 07/31/2020, 3:45 PM  Clinical Narrative:  Elmyra Ricks from [facility] called to report that the SNF Authorization was denied due to patient not being stable for discharge. When medically stable can resubmitt authorization application. Appeal phone number [phone]. [employer] notified.          Expected Discharge Plan and Services                                                 Social Determinants of Health (SDOH) Interventions    Readmission Risk Interventions Readmission Risk Prevention Plan 03/07/2020 02/12/2019 12/25/2018  Transportation Screening Complete - Complete  PCP or Specialist Appt within 3-5 Days Complete - Complete  HRI or Home Care Consult Complete - Complete  Social Work Consult for [place] Planning/Counseling Complete - Complete  Palliative Care Screening Not Applicable - Not Applicable  Medication Review [employer] photographer) Complete - Complete  PCP or Specialist appointment within 3-5 days of discharge - Not Complete -  PCP/Specialist Appt Not Complete comments - not ready for dc, family requests SNF placement -  HRI or Home Care Consult - Complete -  SW Recovery Care/Counseling Consult - Complete -  Palliative Care Screening - Not Applicable -  [place] - Complete -  Some recent data might be hidden

## 2020-07-31 NOTE — Consult Note (Signed)
Cardiology Consultation Note    Patient ID: [name], MRN: [id number], DOB/AGE: 1934/07/11 86 y.o. Admit date: 07/28/2020   Date of Consult: 07/31/2020 Primary Physician: [place] Primary Cardiologist: Dr. Nehemiah Massed  Chief Complaint: confusion Reason for Consultation: afib with rvr/chest pain Requesting MD: Dr. Dwyane Dee  HPI: Damere Brandenburg is a 86 y.o. male with history of diabetes, hypertension, peripheral artery disease status post bilateral BKA's, coronary artery disease, history of diastolic heart failure, chronic pain on chronic opiates who was admitted earlier last month with altered mental status felt to be related to unintentional opiate overdose who presented to the emergency room with altered mental status.  On admission, his EKG showed atrial fibrillation with controlled ventricular response.  Today he was noted to be tachycardic and EKG showed atrial fibrillation with rapid ventricular response.  Cardiology consultation was called.  He currently is being treated with Cardizem CD 120 mg daily, metoprolol succinate 25 mg daily.  Prior to admission he was on Lipitor 80 mg daily, chlorthalidone 25 mg daily, Cardizem 120 daily, Depakote 500 daily, gabapentin, insulin, Imdur 30 mg daily, metoprolol succinate 25 mg daily, rivaroxaban 15 mg daily.  Concern over bradycardia on admission and was taken off of his diltiazem.  This was resumed 2 days ago.  Currently he appears to be in atrial fibrillation at a rate of 104.  Patient is a very difficult historian.  Does not answer questions appropriately.  Heart rate is improved on current regimen.  Past Medical History:  Diagnosis Date  . Arthritis   . Atrial fibrillation ([place])   . Carcinoma of prostate ([place])   . CHF (congestive heart failure) ([place])   . Chronic kidney disease   . Diabetes mellitus without complication ([place])   . ED (erectile dysfunction)   . Frequent urination   . Hyperlipidemia   . Hypertension    . Moderate mitral insufficiency   . Peripheral vascular disease ([place])   . Pneumonia 04/2016  . Prostate cancer ([place])   . Sleep apnea    OSA--USE C-PAP  . Ulcer of left foot due to type 2 diabetes mellitus ([place])   . Urinary stress incontinence, male       Surgical History:  Past Surgical History:  Procedure Laterality Date  . AMPUTATION Left 01/12/2017   Procedure: AMPUTATION BELOW KNEE;  Surgeon: Algernon Huxley, MD;  Location: [facility] ORS;  Service: General;  Laterality: Left;  . AMPUTATION Right 12/27/2018   Procedure: AMPUTATION BELOW KNEE;  Surgeon: Algernon Huxley, MD;  Location: [facility] ORS;  Service: General;  Laterality: Right;  . APLIGRAFT PLACEMENT Left 10/15/2016   Procedure: APLIGRAFT PLACEMENT;  Surgeon: Albertine Patricia, DPM;  Location: [facility] ORS;  Service: Podiatry;  Laterality: Left;  necrotic ulcer  . CHOLECYSTECTOMY    . COLONOSCOPY WITH PROPOFOL N/A 01/02/2019   Procedure: COLONOSCOPY WITH PROPOFOL;  Surgeon: Lucilla Lame, MD;  Location: [facility] ENDOSCOPY;  Service: Endoscopy;  Laterality: N/A;  . COLONOSCOPY WITH PROPOFOL N/A 01/05/2019   Procedure: COLONOSCOPY WITH PROPOFOL;  Surgeon: Lucilla Lame, MD;  Location: [facility] ENDOSCOPY;  Service: Endoscopy;  Laterality: N/A;  . ESOPHAGOGASTRODUODENOSCOPY (EGD) WITH PROPOFOL N/A 01/02/2019   Procedure: ESOPHAGOGASTRODUODENOSCOPY (EGD) WITH PROPOFOL;  Surgeon: Lucilla Lame, MD;  Location: [facility] ENDOSCOPY;  Service: Endoscopy;  Laterality: N/A;  . EYE SURGERY Bilateral    Cataract Extraction with IOL  . HIP ARTHROPLASTY Right 10/13/2018   Procedure: ARTHROPLASTY BIPOLAR HIP (HEMIARTHROPLASTY);  Surgeon: Earnestine Leys, MD;  Location: [facility] ORS;  Service: Orthopedics;  Laterality: Right;  . IRRIGATION AND DEBRIDEMENT FOOT Left 10/15/2016   Procedure: IRRIGATION AND DEBRIDEMENT FOOT-EXCISIONAL DEBRIDEMENT OF SKIN 3RD, 4TH AND 5TH TOES WITH APPLICATION OF APLIGRAFT;  Surgeon: Albertine Patricia, DPM;  Location: [facility] ORS;  Service: Podiatry;  Laterality:  Left;  necrotic,gangrene  . IRRIGATION AND DEBRIDEMENT FOOT Left 12/09/2016   Procedure: IRRIGATION AND DEBRIDEMENT FOOT-MUSCLE/FASCIA, RESECTION OF FOURTH AND FIFTH METATARSAL NECROTIC BONE;  Surgeon: Albertine Patricia, DPM;  Location: [facility] ORS;  Service: Podiatry;  Laterality: Left;  . IRRIGATION AND DEBRIDEMENT FOOT Right 12/23/2018   Procedure: IRRIGATION AND DEBRIDEMENT FOOT and wound vac application;  Surgeon: Samara Deist, DPM;  Location: [facility] ORS;  Service: Podiatry;  Laterality: Right;  . LEFT HEART CATH N/A 06/25/2019   Procedure: Left Heart Cath and Coronary Angiography;  Surgeon: Isaias Cowman, MD;  Location: [place] CV LAB;  Service: Cardiovascular;  Laterality: N/A;  . LEFT HEART CATH AND CORONARY ANGIOGRAPHY N/A 06/05/2018   Procedure: LEFT HEART CATH AND CORONARY ANGIOGRAPHY;  Surgeon: Yolonda Kida, MD;  Location: [place] CV LAB;  Service: Cardiovascular;  Laterality: N/A;  . LOWER EXTREMITY ANGIOGRAPHY Left 08/16/2016   Procedure: Lower Extremity Angiography;  Surgeon: Algernon Huxley, MD;  Location: [place] [facility];  Service: Cardiovascular;  Laterality: Left;  . LOWER EXTREMITY ANGIOGRAPHY Left 09/01/2016   Procedure: Lower Extremity Angiography;  Surgeon: Algernon Huxley, MD;  Location: [place] [facility] LAB;  Service: Cardiovascular;  Laterality: Left;  . LOWER EXTREMITY ANGIOGRAPHY Left 10/14/2016   Procedure: Lower Extremity Angiography;  Surgeon: Algernon Huxley, MD;  Location: [place] [facility] LAB;  Service: Cardiovascular;  Laterality: Left;  . LOWER EXTREMITY ANGIOGRAPHY Left 12/20/2016   Procedure: Lower Extremity Angiography;  Surgeon: Algernon Huxley, MD;  Location: [place] [facility];  Service: Cardiovascular;  Laterality: Left;  . LOWER EXTREMITY ANGIOGRAPHY Right 12/18/2018   Procedure: LOWER EXTREMITY ANGIOGRAPHY;  Surgeon: Algernon Huxley, MD;  Location: [place] [facility] LAB;  Service: Cardiovascular;  Laterality: Right;  . LOWER EXTREMITY INTERVENTION   09/01/2016   Procedure: Lower Extremity Intervention;  Surgeon: Algernon Huxley, MD;  Location: [place] [facility] LAB;  Service: Cardiovascular;;  . PROSTATE SURGERY     removal  . TONSILLECTOMY     as a child     Home Meds: Prior to Admission medications   Medication Sig Start Date End Date Taking? Authorizing Provider  atorvastatin (LIPITOR) 80 MG tablet Take 80 mg by mouth at bedtime.   Yes [provider]  bisacodyl (DULCOLAX) 5 MG EC tablet Take 1 tablet (5 mg total) by mouth daily as needed for moderate constipation. 04/10/20  Yes Lorella Nimrod, MD  chlorthalidone (HYGROTON) 25 MG tablet Take 25 mg by mouth daily.   Yes [provider]  diltiazem (DILACOR XR) 120 MG 24 hr capsule Take 120 mg by mouth daily.   Yes [provider]  divalproex (DEPAKOTE) 500 MG DR tablet Take 500 mg by mouth daily.  02/18/20  Yes [provider]  ferrous sulfate 325 (65 FE) MG tablet Take 325 mg by mouth daily with breakfast.   Yes [provider]  gabapentin (NEURONTIN) 300 MG capsule Take 300 mg by mouth 3 (three) times daily.  02/07/19  Yes [provider]  Insulin Glargine (BASAGLAR KWIKPEN) 100 UNIT/ML Inject 25 Units into the skin at bedtime. 04/08/20  Yes [provider]  isosorbide mononitrate (IMDUR) 30 MG 24 hr tablet Take 60 mg by mouth  daily. 11/07/19  Yes [provider]  KLOR-CON M20 20 MEQ tablet Take 20 mEq by mouth 2 (two) times daily. 02/18/20  Yes [provider]  metFORMIN (GLUCOPHAGE) 500 MG tablet Take 500 mg by mouth 2 (two) times daily with a meal.   Yes [provider]  metoprolol succinate (TOPROL-XL) 25 MG 24 hr tablet Take 1 tablet (25 mg total) by mouth daily. Take with or immediately following a meal. 04/10/20  Yes Lorella Nimrod, MD  NOVOLOG FLEXPEN 100 UNIT/ML FlexPen Inject 4 Units into the skin 3 (three) times daily with meals. 04/08/20  Yes [provider]  Rivaroxaban (XARELTO) 15 MG  TABS tablet Take 1 tablet (15 mg total) by mouth daily with supper. Hold Xarelto for 3 days.  Start on January 09, 2019. 01/09/19  Yes [name], MD  TIADYLT ER 120 MG 24 hr capsule Take 120 mg by mouth at bedtime. 07/22/20  Yes [provider]  Ensure Max Protein (ENSURE MAX PROTEIN) LIQD Take 330 mLs (11 oz total) by mouth 2 (two) times daily. 04/10/20   Lorella Nimrod, MD    Inpatient Medications:  . atorvastatin  80 mg Oral QHS  . chlorthalidone  25 mg Oral Daily  . diltiazem  120 mg Oral Daily  . divalproex  500 mg Oral Daily  . ferrous sulfate  325 mg Oral Q breakfast  . gabapentin  100 mg Oral TID  . insulin aspart  0-15 Units Subcutaneous TID WC  . insulin aspart  0-5 Units Subcutaneous QHS  . insulin glargine  15 Units Subcutaneous QHS  . isosorbide mononitrate  60 mg Oral Daily  . metoprolol succinate  25 mg Oral Q breakfast  . multivitamin with minerals  1 tablet Oral Daily  . [START ON 08/01/2020] pantoprazole  40 mg Oral QHS  . potassium chloride SA  20 mEq Oral BID  . Ensure Max Protein  11 oz Oral BID  . Rivaroxaban  15 mg Oral Q supper     Allergies:  Allergies  Allergen Reactions  . Contrast Media [Iodinated Diagnostic Agents] Shortness Of Breath    SOB  . Iohexol Shortness Of Breath     Desc: Respiratory Distress, Laryngedema, diaphoresis, Onset Date: [id number]   . Metrizamide Shortness Of Breath    SOB SOB SOB   . Latex Rash    Social History   Socioeconomic History  . Marital status: Married    Spouse name: Not on file  . Number of children: Not on file  . Years of education: Not on file  . Highest education level: Not on file  Occupational History  . Occupation: retired  Tobacco Use  . Smoking status: Former Smoker    Packs/day: 0.25    Types: Cigarettes    Quit date: 10/14/1994    Years since quitting: 25.8  . Smokeless tobacco: Never Used  Vaping Use  . Vaping Use: Never used  Substance and Sexual Activity  . Alcohol use: No     Alcohol/week: 0.0 standard drinks  . Drug use: No  . Sexual activity: Not on file  Other Topics Concern  . Not on file  Social History Narrative   Lives at home with wife, has a wheelchair   Social Determinants of Health   Financial Resource Strain: Not on file  Food Insecurity: Not on file  Transportation Needs: Not on file  Physical Activity: Not on file  Stress: Not on file  Social Connections: Not on file  Intimate  Partner Violence: Not on file     Family History  Problem Relation Age of Onset  . Hypertension Mother   . Diabetes Mother   . Prostate cancer Neg Hx   . Bladder Cancer Neg Hx   . Kidney disease Neg Hx      Review of Systems: A 12-system review of systems was performed and is negative except as noted in the HPI.  Labs: No results for input(s): CKTOTAL, CKMB, TROPONINI in the last 72 hours. Lab Results  Component Value Date   WBC 7.8 07/31/2020   HGB 15.2 07/31/2020   HCT 44.9 07/31/2020   MCV 89.1 07/31/2020   PLT 202 07/31/2020    Recent Labs  Lab 07/31/20 0533  NA 139  K 4.5  CL 106  CO2 26  BUN 46*  CREATININE 1.44*  CALCIUM 8.6*  PROT 6.7  BILITOT 0.9  ALKPHOS 85  ALT 22  AST 26  GLUCOSE 105*   Lab Results  Component Value Date   CHOL 154 04/08/2020   HDL 39 (L) 04/08/2020   LDLCALC 101 (H) 04/08/2020   TRIG 72 04/08/2020   Lab Results  Component Value Date   DDIMER 0.98 (H) 02/10/2019    Radiology/Studies:  CT Head Wo Contrast  Result Date: 07/28/2020 CLINICAL DATA:  Mental status changes. EXAM: CT HEAD WITHOUT CONTRAST TECHNIQUE: Contiguous axial images were obtained from the base of the skull through the vertex without intravenous contrast. COMPARISON:  MRI 06/26/2020.  CT scan 06/25/2020. FINDINGS: Brain: There is no evidence for acute hemorrhage, hydrocephalus, mass lesion, or abnormal extra-axial fluid collection. No definite CT evidence for acute infarction. Diffuse loss of parenchymal volume is consistent with atrophy.  Patchy low attenuation in the deep hemispheric and periventricular white matter is nonspecific, but likely reflects chronic microvascular ischemic demyelination. Vascular: No hyperdense vessel or unexpected calcification. Skull: No evidence for fracture. No worrisome lytic or sclerotic lesion. Sinuses/Orbits: The visualized paranasal sinuses and mastoid air cells are clear. Visualized portions of the globes and intraorbital fat are unremarkable. Other: None. IMPRESSION: 1. No acute intracranial abnormality. 2. Atrophy with chronic small vessel white matter ischemic disease. Electronically Signed   By: Misty Stanley M.D.   On: 07/28/2020 13:35   DG Chest Portable 1 View  Result Date: 07/28/2020 CLINICAL DATA:  86 year old with acute mental status changes and open coil bore responsiveness". Current history of CHF, hypertension. EXAM: PORTABLE CHEST 1 VIEW COMPARISON:  06/25/2020 and earlier, including CT chest 11/30/2019. FINDINGS: Cardiac silhouette moderately to markedly enlarged for AP portable technique, unchanged. Pulmonary venous hypertension and likely mild interstitial pulmonary edema. No confluent airspace consolidation. No visible pleural effusions. IMPRESSION: Mild CHF and/or fluid overload is suspected, with stable moderate to marked cardiomegaly. Electronically Signed   By: Evangeline Dakin M.D.   On: 07/28/2020 17:00    Wt Readings from Last 3 Encounters:  07/31/20 88.5 kg  07/15/20 92.5 kg  06/26/20 92.5 kg    EKG: Atrial fibrillation with variable but fairly rapid ventricular response  Physical Exam:  Blood pressure 108/84, pulse 62, temperature 97.9 F (36.6 C), resp. rate 19, height 5\' 7"  (1.702 m), weight 88.5 kg, SpO2 91 %. Body mass index is 30.56 kg/m. General: Well developed, well nourished, in no acute distress. Head: Normocephalic, atraumatic, sclera non-icteric, no xanthomas, nares are without discharge.  Neck: Negative for carotid bruits. JVD not elevated. Lungs: Clear  bilaterally to auscultation without wheezes, rales, or rhonchi. Breathing is unlabored. Heart: Irregular regular rhythm Abdomen:  Soft, non-tender, non-distended with normoactive bowel sounds. No hepatomegaly. No rebound/guarding. No obvious abdominal masses. Msk:  Strength and tone appear normal for age. Extremities: No clubbing or cyanosis. No edema.  Distal pedal pulses are 2+ and equal bilaterally. Neuro: Very confused.     Assessment and Plan  S/P bilateral BKA (below knee amputation) (HCC) Paroxysmal A-fib (HCC) Seizure disorder (HCC) CAD (coronary artery disease) Acute toxic-metabolic encephalopathy (HFpEF) heart failure with preserved ejection fraction (HCC) AMS (altered mental status) Chronic anticoagulation Chronic kidney disease, stage 3a (HCC) Lactic acidosis Hypoxic Chronic prescription opiate use Elevated troponin  1.  Paroxysmal atrial fibrillation-currently in atrial fibrillation with variable and relatively rapid ventricular response.  Would continue with diltiazem at fairly low dose at 120 mg daily given relatively soft blood pressure with as needed 30 mg doses for breakthrough.  Would continue with metoprolol and Xarelto.  2.  Encephalopathy-we will continue to follow.  Agree with changes his medications and following.  Not appear to be able to comprehend.  3.  Elevated troponin-does not appear to be as due to acute coronary syndrome.  Likely demand.  Not a candidate for invasive evaluation.  Had nonobstructive coronary disease by cath fairly recently.  4.  CKD-stable.  Will follow.  Ivin Booty MD 07/31/2020, 11:59 AM Pager: [phone]

## 2020-08-01 ENCOUNTER — Inpatient Hospital Stay
Admit: 2020-08-01 | Discharge: 2020-08-01 | Disposition: A | Discharge status: Home / Self Care | Payer: Medicare Other | Attending: Student | Admitting: Student

## 2020-08-01 DIAGNOSIS — R079 Chest pain, unspecified: Secondary | ICD-10-CM

## 2020-08-01 DIAGNOSIS — G9340 Encephalopathy, unspecified: Secondary | ICD-10-CM | POA: Diagnosis not present

## 2020-08-01 LAB — ECHOCARDIOGRAM COMPLETE
AR max vel: 2.73 cm2
AV Area VTI: 3.07 cm2
AV Area mean vel: 2.76 cm2
AV Mean grad: 4 mmHg
AV Peak grad: 7 mmHg
Ao pk vel: 1.32 m/s
Area-P 1/2: 6.17 cm2
Calc EF: 40.6 %
Height: 67 in
MV VTI: 3.91 cm2
S' Lateral: 4.95 cm
Single Plane A2C EF: 36.2 %
Single Plane A4C EF: 35.9 %
Weight: 3102.4 oz

## 2020-08-01 LAB — COMPREHENSIVE METABOLIC PANEL
ALT: 24 U/L (ref 0–44)
AST: 28 U/L (ref 15–41)
Albumin: 3.5 g/dL (ref 3.5–5.0)
Alkaline Phosphatase: 87 U/L (ref 38–126)
Anion gap: 9 (ref 5–15)
BUN: 53 mg/dL — ABNORMAL HIGH (ref 8–23)
CO2: 26 mmol/L (ref 22–32)
Calcium: 8.8 mg/dL — ABNORMAL LOW (ref 8.9–10.3)
Chloride: 105 mmol/L (ref 98–111)
Creatinine, Ser: 1.61 mg/dL — ABNORMAL HIGH (ref 0.61–1.24)
GFR, Estimated: 42 mL/min — ABNORMAL LOW (ref >60)
Glucose, Bld: 116 mg/dL — ABNORMAL HIGH (ref 70–99)
Potassium: 5.1 mmol/L (ref 3.5–5.1)
Sodium: 140 mmol/L (ref 135–145)
Total Bilirubin: 0.9 mg/dL (ref 0.3–1.2)
Total Protein: 7.1 g/dL (ref 6.5–8.1)

## 2020-08-01 LAB — CBC
HCT: 42.6 % (ref 39.0–52.0)
Hemoglobin: 14 g/dL (ref 13.0–17.0)
MCH: 29.8 pg (ref 26.0–34.0)
MCHC: 32.9 g/dL (ref 30.0–36.0)
MCV: 90.6 fL (ref 80.0–100.0)
Platelets: 226 10*3/uL (ref 150–400)
RBC: 4.7 MIL/uL (ref 4.22–5.81)
RDW: 14.2 % (ref 11.5–15.5)
WBC: 8 10*3/uL (ref 4.0–10.5)
nRBC: 0 % (ref 0.0–0.2)

## 2020-08-01 LAB — CULTURE, BLOOD (ROUTINE X 2)

## 2020-08-01 LAB — GLUCOSE, CAPILLARY
Glucose-Capillary: 122 mg/dL — ABNORMAL HIGH (ref 70–99)
Glucose-Capillary: 152 mg/dL — ABNORMAL HIGH (ref 70–99)
Glucose-Capillary: 135 mg/dL — ABNORMAL HIGH (ref 70–99)
Glucose-Capillary: 173 mg/dL — ABNORMAL HIGH (ref 70–99)

## 2020-08-01 MED ORDER — PERFLUTREN LIPID MICROSPHERE
1.0000 mL | INTRAVENOUS | Status: AC | PRN
  Administered 2020-08-01: 2 mL via INTRAVENOUS
  Filled 2020-08-01: qty 10

## 2020-08-01 MED ORDER — DILTIAZEM HCL ER COATED BEADS 240 MG PO CP24
240.0000 mg | ORAL_CAPSULE | Freq: Every day | ORAL | Status: DC
  Administered 2020-08-02 – 2020-08-04 (×3): 240 mg via ORAL
  Filled 2020-08-01 (×3): qty 1

## 2020-08-01 NOTE — TOC Progression Note (Signed)
Transition of Care [facility]) - Progression Note    Patient Details  Name: [name] MRN: [id number] Date of Birth: 29-Jan-1935  Transition of Care [facility]) CM/SW [place], RN Phone Number: 08/01/2020, 4:38 PM  Clinical Narrative:  Resubmitted SNF authorization application in [place].           Expected Discharge Plan and Services                                                 Social Determinants of Health (SDOH) Interventions    Readmission Risk Interventions Readmission Risk Prevention Plan 03/07/2020 02/12/2019 12/25/2018  Transportation Screening Complete - Complete  PCP or Specialist Appt within 3-5 Days Complete - Complete  HRI or Home Care Consult Complete - Complete  Social Work Consult for [place] Planning/Counseling Complete - Complete  Palliative Care Screening Not Applicable - Not Applicable  Medication Review [employer] photographer) Complete - Complete  PCP or Specialist appointment within 3-5 days of discharge - Not Complete -  PCP/Specialist Appt Not Complete comments - not ready for dc, family requests SNF placement -  HRI or Home Care Consult - Complete -  SW Recovery Care/Counseling Consult - Complete -  Palliative Care Screening - Not Applicable -  [place] - Complete -  Some recent data might be hidden

## 2020-08-01 NOTE — Progress Notes (Signed)
Patient Name: [name] Date of Encounter: 08/01/2020  Hospital Problem List     Active Problems:   S/P bilateral BKA (below knee amputation) (HCC)   Paroxysmal A-fib (HCC)   Seizure disorder (HCC)   CAD (coronary artery disease)   Acute toxic-metabolic encephalopathy   (HFpEF) heart failure with preserved ejection fraction (HCC)   AMS (altered mental status)   Chronic anticoagulation   Chronic kidney disease, stage 3a (HCC)   Lactic acidosis   Hypoxic   Chronic prescription opiate use   Elevated troponin   Acute encephalopathy    Patient Profile     86 y.o. male with history of diabetes, hypertension, peripheral artery disease status post bilateral BKA's, coronary artery disease, history of diastolic heart failure, chronic pain on chronic opiates who was admitted earlier last month with altered mental status felt to be related to unintentional opiate overdose who presented to the emergency room with altered mental status.  On admission, his EKG showed atrial fibrillation with controlled ventricular response.  Today he was noted to be tachycardic and EKG showed atrial fibrillation with rapid ventricular response.  Cardiology consultation was called.  He currently is being treated with Cardizem CD 120 mg daily, metoprolol succinate 25 mg daily.  Prior to admission he was on Lipitor 80 mg daily, chlorthalidone 25 mg daily, Cardizem 120 daily, Depakote 500 daily, gabapentin, insulin, Imdur 30 mg daily, metoprolol succinate 25 mg daily, rivaroxaban 15 mg daily.  Concern over bradycardia on admission and was taken off of his diltiazem.  This was resumed 2 days ago.  Currently he appears to be in atrial fibrillation at a rate of 104.  Patient is a very difficult historian.  Does not answer questions appropriately.   Subjective   Very difficult historian.   Inpatient Medications    . atorvastatin  80 mg Oral QHS  . chlorthalidone  25 mg Oral Daily  . diltiazem  120 mg Oral  Daily  . divalproex  500 mg Oral Daily  . ferrous sulfate  325 mg Oral Q breakfast  . gabapentin  100 mg Oral TID  . insulin aspart  0-15 Units Subcutaneous TID WC  . insulin aspart  0-5 Units Subcutaneous QHS  . insulin glargine  15 Units Subcutaneous QHS  . isosorbide mononitrate  60 mg Oral Daily  . metoprolol succinate  25 mg Oral Q breakfast  . multivitamin with minerals  1 tablet Oral Daily  . pantoprazole  40 mg Oral QHS  . Ensure Max Protein  11 oz Oral BID  . Rivaroxaban  15 mg Oral Q supper    Vital Signs    Vitals:   08/01/20 0045 08/01/20 0452 08/01/20 0712 08/01/20 1152  BP:  (!) 128/52 (!) 150/66 (!) 157/91  Pulse:  (!) 58 71 (!) 56  Resp:  18 18   Temp:  97.8 F (36.6 C) (!) 97.4 F (36.3 C) (!) 97.5 F (36.4 C)  TempSrc:   Oral Oral  SpO2:  99% 99% 97%  Weight: 88 kg     Height:        Intake/Output Summary (Last 24 hours) at 08/01/2020 1553 Last data filed at 08/01/2020 1208 Gross per 24 hour  Intake 240 ml  Output 1650 ml  Net -1410 ml   Filed Weights   07/30/20 0356 07/31/20 0451 08/01/20 0045  Weight: 88.6 kg 88.5 kg 88 kg    Physical Exam    GEN: Well nourished, well developed, in no acute  distress.  HEENT: normal.  Neck: Supple, no JVD, carotid bruits, or masses. Cardiac: Irregular regular rhythm.  Status post bilateral BKA's. Respiratory:  Respirations regular and unlabored, clear to auscultation bilaterally. GI: Soft, nontender, nondistended, BS + x 4. MS: no deformity or atrophy. Skin: warm and dry, no rash. Neuro: Limited historian.  Very confused.  Labs    CBC Recent Labs    07/31/20 0533 08/01/20 0658  WBC 7.8 8.0  HGB 15.2 14.0  HCT 44.9 42.6  MCV 89.1 90.6  PLT 202 834   Basic Metabolic Panel Recent Labs    07/31/20 0533 08/01/20 0658  NA 139 140  K 4.5 5.1  CL 106 105  CO2 26 26  GLUCOSE 105* 116*  BUN 46* 53*  CREATININE 1.44* 1.61*  CALCIUM 8.6* 8.8*  MG 2.1  --   PHOS 3.7  --    Liver Function  Tests Recent Labs    07/31/20 0533 08/01/20 0658  AST 26 28  ALT 22 24  ALKPHOS 85 87  BILITOT 0.9 0.9  PROT 6.7 7.1  ALBUMIN 3.1* 3.5   No results for input(s): LIPASE, AMYLASE in the last 72 hours. Cardiac Enzymes No results for input(s): CKTOTAL, CKMB, CKMBINDEX, TROPONINI in the last 72 hours. BNP No results for input(s): BNP in the last 72 hours. D-Dimer No results for input(s): DDIMER in the last 72 hours. Hemoglobin A1C No results for input(s): HGBA1C in the last 72 hours. Fasting Lipid Panel No results for input(s): CHOL, HDL, LDLCALC, TRIG, CHOLHDL, LDLDIRECT in the last 72 hours. Thyroid Function Tests No results for input(s): TSH, T4TOTAL, T3FREE, THYROIDAB in the last 72 hours.  Invalid input(s): FREET3  Telemetry    afib with variable vr  ECG    afib with rvr  Radiology    CT Head Wo Contrast  Result Date: 07/28/2020 CLINICAL DATA:  Mental status changes. EXAM: CT HEAD WITHOUT CONTRAST TECHNIQUE: Contiguous axial images were obtained from the base of the skull through the vertex without intravenous contrast. COMPARISON:  MRI 06/26/2020.  CT scan 06/25/2020. FINDINGS: Brain: There is no evidence for acute hemorrhage, hydrocephalus, mass lesion, or abnormal extra-axial fluid collection. No definite CT evidence for acute infarction. Diffuse loss of parenchymal volume is consistent with atrophy. Patchy low attenuation in the deep hemispheric and periventricular white matter is nonspecific, but likely reflects chronic microvascular ischemic demyelination. Vascular: No hyperdense vessel or unexpected calcification. Skull: No evidence for fracture. No worrisome lytic or sclerotic lesion. Sinuses/Orbits: The visualized paranasal sinuses and mastoid air cells are clear. Visualized portions of the globes and intraorbital fat are unremarkable. Other: None. IMPRESSION: 1. No acute intracranial abnormality. 2. Atrophy with chronic small vessel white matter ischemic disease.  Electronically Signed   By: Misty Stanley M.D.   On: 07/28/2020 13:35   DG Chest Portable 1 View  Result Date: 07/28/2020 CLINICAL DATA:  86 year old with acute mental status changes and open coil bore responsiveness". Current history of CHF, hypertension. EXAM: PORTABLE CHEST 1 VIEW COMPARISON:  06/25/2020 and earlier, including CT chest 11/30/2019. FINDINGS: Cardiac silhouette moderately to markedly enlarged for AP portable technique, unchanged. Pulmonary venous hypertension and likely mild interstitial pulmonary edema. No confluent airspace consolidation. No visible pleural effusions. IMPRESSION: Mild CHF and/or fluid overload is suspected, with stable moderate to marked cardiomegaly. Electronically Signed   By: Evangeline Dakin M.D.   On: 07/28/2020 17:00   ECHOCARDIOGRAM COMPLETE  Result Date: 08/01/2020    ECHOCARDIOGRAM REPORT   Patient Name:  [name] Date of Exam: 08/01/2020 Medical Rec #:  [id number]             Height:       67.0 in Accession #:    [id number]            Weight:       193.9 lb Date of Birth:  Jan 25, 1935            BSA:          1.996 m Patient Age:    84 years              BP:           150/66 mmHg Patient Gender: M                     HR:           66 bpm. Exam Location:  [facility] Procedure: 2D Echo, Cardiac Doppler, Color Doppler and Intracardiac            Opacification Agent Indications:     R07.9 Chest Pain  History:         Patient has prior history of Echocardiogram examinations, most                  recent 11/30/2019. CHF, CKD; Risk Factors:Hypertension, Diabetes                  and Dyslipidemia. Hx of COVID-19 in 01/2019.  Sonographer:     Charmayne Sheer RDCS (AE) Referring Phys:  [id number] Val Riles Diagnosing Phys: Bartholome Bill MD  Sonographer Comments: Technically difficult study due to poor echo windows. Image acquisition challenging due to patient body habitus and Image acquisition challenging due to uncooperative patient. IMPRESSIONS  1. Left ventricular ejection  fraction, by estimation, is 45 to 50%. The left ventricle has mildly decreased function. The left ventricle has no regional wall motion abnormalities. Left ventricular diastolic parameters are consistent with Grade I diastolic dysfunction (impaired relaxation).  2. Right ventricular systolic function is normal. The right ventricular size is normal.  3. Right atrial size was mildly dilated.  4. The mitral valve was not well visualized. Trivial mitral valve regurgitation.  5. The aortic valve was not well visualized. Aortic valve regurgitation is trivial.  6. Aortic dilatation noted. There is borderline dilatation of the aortic root, measuring 36 mm. FINDINGS  Left Ventricle: Left ventricular ejection fraction, by estimation, is 45 to 50%. The left ventricle has mildly decreased function. The left ventricle has no regional wall motion abnormalities. Definity contrast agent was given IV to delineate the left ventricular endocardial borders. The left ventricular internal cavity size was normal in size. There is no left ventricular hypertrophy. Left ventricular diastolic parameters are consistent with Grade I diastolic dysfunction (impaired relaxation). Right Ventricle: The right ventricular size is normal. No increase in right ventricular wall thickness. Right ventricular systolic function is normal. Left Atrium: Left atrial size was normal in size. Right Atrium: Right atrial size was mildly dilated. Pericardium: There is no evidence of pericardial effusion. Mitral Valve: The mitral valve was not well visualized. Trivial mitral valve regurgitation. MV peak gradient, 3.2 mmHg. The mean mitral valve gradient is 2.0 mmHg. Tricuspid Valve: The tricuspid valve is not well visualized. Tricuspid valve regurgitation is mild. Aortic Valve: The aortic valve was not well visualized. Aortic valve regurgitation is trivial. Aortic valve mean gradient measures 4.0 mmHg. Aortic valve peak gradient measures 7.0 mmHg. Aortic valve  area,  by VTI measures 3.07 cm. Pulmonic Valve: The pulmonic valve was not well visualized. Pulmonic valve regurgitation is trivial. Aorta: Aortic dilatation noted. There is borderline dilatation of the aortic root, measuring 36 mm. IAS/Shunts: The interatrial septum was not well visualized.  LEFT VENTRICLE PLAX 2D LVIDd:         6.30 cm      Diastology LVIDs:         4.95 cm      LV e' medial:    3.81 cm/s LV PW:         1.00 cm      LV E/e' medial:  15.9 LV IVS:        0.90 cm      LV e' lateral:   3.48 cm/s LVOT diam:     2.40 cm      LV E/e' lateral: 17.4 LV SV:         82 LV SV Index:   41 LVOT Area:     4.52 cm  LV Volumes (MOD) LV vol d, MOD A2C: 107.0 ml LV vol d, MOD A4C: 105.0 ml LV vol s, MOD A2C: 68.3 ml LV vol s, MOD A4C: 67.3 ml LV SV MOD A2C:     38.7 ml LV SV MOD A4C:     105.0 ml LV SV MOD BP:      46.2 ml RIGHT VENTRICLE RV Basal diam:  3.40 cm LEFT ATRIUM             Index       RIGHT ATRIUM           Index LA diam:        5.70 cm 2.86 cm/m  RA Area:     15.00 cm LA Vol (A2C):   70.1 ml 35.12 ml/m RA Volume:   32.70 ml  16.38 ml/m LA Vol (A4C):   67.6 ml 33.87 ml/m LA Biplane Vol: 69.9 ml 35.02 ml/m  AORTIC VALVE AV Area (Vmax):    2.73 cm AV Area (Vmean):   2.76 cm AV Area (VTI):     3.07 cm AV Vmax:           132.00 cm/s AV Vmean:          89.700 cm/s AV VTI:            0.267 m AV Peak Grad:      7.0 mmHg AV Mean Grad:      4.0 mmHg LVOT Vmax:         79.60 cm/s LVOT Vmean:        54.700 cm/s LVOT VTI:          0.181 m LVOT/AV VTI ratio: 0.68  AORTA Ao Root diam: 3.60 cm MITRAL VALVE MV Area (PHT): 6.17 cm    SHUNTS MV Area VTI:   3.91 cm    Systemic VTI:  0.18 m MV Peak grad:  3.2 mmHg    Systemic Diam: 2.40 cm MV Mean grad:  2.0 mmHg MV Vmax:       0.89 m/s MV Vmean:      58.3 cm/s MV Decel Time: 123 msec MV E velocity: 60.40 cm/s MV A velocity: 95.55 cm/s MV E/A ratio:  0.63 Bartholome Bill MD Electronically signed by Bartholome Bill MD Signature Date/Time: 08/01/2020/1:09:25 PM    Final      Assessment & Plan    S/P bilateral BKA (below knee amputation) (HCC) Paroxysmal A-fib (HCC) Seizure disorder (HCC) CAD (  coronary artery disease) Acute toxic-metabolic encephalopathy (HFpEF) heart failure with preserved ejection fraction (HCC) AMS (altered mental status) Chronic anticoagulation Chronic kidney disease, stage 3a (HCC) Lactic acidosis Hypoxic Chronic prescription opiate use Elevated troponin  1.  Paroxysmal atrial fibrillation-currently in atrial fibrillation with variable and relatively rapid ventricular response.    We will increase diltiazem to 240 daily and follow. Would continue with metoprolol and Xarelto.  2.  Encephalopathy-we will continue to follow.  Agree with changes his medications and following.  Not appear to be able to comprehend.  3.  Elevated troponin-does not appear to be as due to acute coronary syndrome.  Likely demand.  Not a candidate for invasive evaluation.  Had nonobstructive coronary disease by cath fairly recently.  4.  CKD-stable.  Will follow.  Signed, Javier Docker Leesa Leifheit MD 08/01/2020, 3:53 PM  Pager: [phone]

## 2020-08-01 NOTE — Progress Notes (Signed)
*  PRELIMINARY RESULTS* Echocardiogram 2D Echocardiogram has been performed.  [name] 08/01/2020, 12:32 PM

## 2020-08-01 NOTE — Progress Notes (Signed)
Occupational Therapy Treatment Patient Details Name: [name] MRN: [id number] DOB: Nov 11, 1934 Today's Date: 08/01/2020    History of present illness Antonia Culbertson is a 86 y.o. male with medical history significant for DM, HTN, PAD status post bilateral BKA, CAD, diastolic heart failure, CKD stage IIIa, paroxysmal A. fib on Xarelto, seizure disorder, history of UTIs, chronic pain on chronic opiates, hospitalized from 2/2-2/3 with altered mental status believed related to unintentional opiate overdose with suspected underlying dementia, and recently seen in the ER on 2/22 with dysuria with urine culture showing less than 10,000 colonies, who was brought to the emergency room with altered mental status, initially showing response to Narcan but with fluctuating mental status after several hours of observation in the emergency room.  History is limited due to persistent confusion.  Patient was apparently found to have decreased responsiveness from his baseline on arrival of his home health nurse and EMS was activated   OT comments  Pt seen for OT treatment this date to f/u re: safety and INDEP with ADLs/ADL mobility. Pt is noted to have had an episode of urinary and some light bowel incontinence in bed. He is able to follow most commands, but is grossly disoriented and requires increased processing time for cues. Pt requires multimodal cues and appears to respond best to tactile or visual. Pt requires MIN A +2 to laterally roll in bed with cues to sequence including cues to grab bed railing. Pt declines to get OOB with OT at this time citing fatigue, but is agreeable to participating in bed level toileting. Pt requires MOD A for peri care and requires MIN A for UB dressing to change gown. Cues to sequence provided throughout these self care tasks as well. Continue to anticipate that pt will require STR stay in SNF setting for safety with mobilizing and self care upon d/c from hospital.     Follow Up Recommendations  SNF    Equipment Recommendations  Other (comment) (defer)    Recommendations for Other Services      Precautions / Restrictions Precautions Precautions: Fall Restrictions Other Position/Activity Restrictions: bilateral LE amputee, prosthetics in room       Mobility Bed Mobility Overal bed mobility: Needs Assistance Bed Mobility: Rolling Rolling: Min assist;+2 for physical assistance         General bed mobility comments: cues to sequence rolling for toileting and linen change.    Transfers                declines to get OOB with OT this session citing fatigue.      Balance                                           ADL either performed or assessed with clinical judgement   ADL Overall ADL's : Needs assistance/impaired     Grooming: Wash/dry hands;Set up;Cueing for sequencing;Bed level Grooming Details (indicate cue type and reason): high folwer's position         Upper Body Dressing : Minimal assistance;Bed level Upper Body Dressing Details (indicate cue type and reason): high folwer's position, tactile and verbal cues to sequence         Toileting- Clothing Manipulation and Hygiene: Moderate assistance;Bed level Toileting - Clothing Manipulation Details (indicate cue type and reason): bed level using rolling tehcnique, tactile/verbal cues to initiate task, pt requires MAX A for  posterior, MIN A for anterior peri care after episode of urinary incontinence with also some BM smear in the bed.             Vision Patient Visual Report: No change from baseline     Perception     Praxis      Cognition Arousal/Alertness: Awake/alert (groggy/drowsy having just been napping) Behavior During Therapy: WFL for tasks assessed/performed Overall Cognitive Status: No family/caregiver present to determine baseline cognitive functioning                                 General Comments: Pt  arousable, but he is relatively disoriented to place, time and situation. He follows ~80% of simple one step commands with increased processing time, he is somewhat drowsy on OT presentation to room.        Exercises Other Exercises Other Exercises: OT engages pt in rolling, peri care, and dressing tasks at bed level as pt declines to get OOB   Shoulder Instructions       General Comments      Pertinent Vitals/ Pain       Pain Assessment: No/denies pain  Home Living                                          Prior Functioning/Environment              Frequency  Min 1X/week        Progress Toward Goals  OT Goals(current goals can now be found in the care plan section)  Progress towards OT goals: OT to reassess next treatment  Acute Rehab OT Goals Patient Stated Goal: to get stronger OT Goal Formulation: With patient Time For Goal Achievement: 08/12/20 Potential to Achieve Goals: Good  Plan Discharge plan remains appropriate;Frequency remains appropriate    Co-evaluation                 AM-PAC OT "6 Clicks" Daily Activity     Outcome Measure   Help from another person eating meals?: None Help from another person taking care of personal grooming?: A Little Help from another person toileting, which includes using toliet, bedpan, or urinal?: Total Help from another person bathing (including washing, rinsing, drying)?: A Lot Help from another person to put on and taking off regular upper body clothing?: A Little Help from another person to put on and taking off regular lower body clothing?: A Lot 6 Click Score: 15    End of Session    OT Visit Diagnosis: Other abnormalities of gait and mobility (R26.89);Other symptoms and signs involving cognitive function;Muscle weakness (generalized) (M62.81)   Activity Tolerance Patient tolerated treatment well   Patient Left in bed;with bed alarm set;with call bell/phone within reach;with  nursing/sitter in room (CNA present taking vitals)   Nurse Communication Mobility status        Time: 2458-0998 OT Time Calculation (min): 23 min  Charges: OT General Charges $OT Visit: 1 Visit OT Treatments $Self Care/Home Management : 8-22 mins $Therapeutic Activity: 8-22 mins  Gerrianne Scale, MS, OTR/L ascom [phone] 08/01/20, 5:57 PM

## 2020-08-01 NOTE — Care Management Important Message (Signed)
Important Message  Patient Details  Name: Clem Wisenbaker MRN: [id number] Date of Birth: June 17, 1934   Medicare Important Message Given:  Yes     Dannette Barbara 08/01/2020, 1:22 PM

## 2020-08-01 NOTE — Progress Notes (Signed)
[facility] Progress Note  Patient: [name]    ESP:[id number]  DOA: 07/28/2020     Date of Service: the patient was seen and examined on 08/01/2020  No chief complaint on file.  Brief hospital course: From admission hpi: [name] a 85 y.o.malewith medical history significant forDM, HTN, PAD status post bilateral BKA, CAD, diastolic heart failure, CKD stage IIIa, paroxysmal A. fib on Xarelto, seizure disorder, history of UTIs, chronic pain on chronic opiates, hospitalized from 2/2-2/3 with altered mental status believed related to unintentional opiate overdose with suspected underlying dementia, and recently seen in the ER on 2/22 with dysuria with urine culture showing less than 10,000 colonies, who was brought to the emergency room with altered mental status, initially showing response to Narcan but with fluctuating mental status after several hours of observation in the emergency room. History is limited due to persistent confusion. Patient was apparently found to have decreased responsiveness from his baseline on arrival of his home health nurse and EMS was activated Patient was monitored for several hours in the ER but continued to have intermittent confusion and somnolence. Hospitalist consulted for admission.    Assessment and Plan: Active Problems:   S/P bilateral BKA (below knee amputation) (HCC)   Paroxysmal A-fib (HCC)   Seizure disorder (HCC)   CAD (coronary artery disease)   Acute toxic-metabolic encephalopathy   (HFpEF) heart failure with preserved ejection fraction (HCC)   AMS (altered mental status)   Chronic anticoagulation   Chronic kidney disease, stage 3a (HCC)   Lactic acidosis   Hypoxic   Chronic prescription opiate use   Elevated troponin  # Acutetoxic metabolic encephalopathy, Resolved Pt was Hospitalized for similar one month ago, then worked up with MRI and EEG which were both negative. Has had rapid improvement  overnight. Previously thought to be 2/2 opioids but does not on opioids at home currently. Home gabapentin may contribute. Presented with glucose of 67 so hypoglycemia may cause/contribute. Was hypoxic (resolved), which can contribute to encephalopathy. No electrolyte abnormalities or report of seizure-like activity. No cough or pulmonary infiltrate to suggest pneumonia. No dysuria and ua does not appear infected. Denies drug/alcohol use. CT head without acute findings. No clear septic picture. 1/4 bottles growing GPRs (discussed w/ ID, likely contaminant). Lactate elevated, appears to be chronic?  -  UDS negative - repeat lactate 2.6, cmp, cbc -  urine cx negative and blood cultures contamination as per ID -Continue seizure precautions  - decrease dose home gabapentin - has bed at [facility] (family had previously arranged to admit patient there). Pt/ot advising snf. Should be able to d/c there on 3/9   # Acute hypoxic respiratory failure Resolved rapidly  # Chronic a fib Here bradycardic initially so home dilt held. This PM RVR with HR 120-130s,   hemodynamically stable. - cont home metop, xarelto - add back home diltiazem, monitor 3/9 still HR is elevated so started Cardizem 30 mg p.o. every 8 hourly as needed if heart rate >130 3/10 consulted cardiology due to persistent A. fib with RVR and patient complaining of chest pain 3/11 HR is under control today  # Elevated troponin, h/o nonobstructive CAD  Chronically elevated, flat and stable, likely demand, no chest pain or ekg changes to suggest ischemia 3/10 complaining of chest pain, required nitroglycerin, and aspirin 324x1 given, started oral aspirin Trop chronically elevated, repeated and remained flat Cardiology recommended no ischemic work-up as patient had cardiac cath few years ago which showed  nonobstructive  CAD Follow 2D echocardiogram    #Chronic diastolic heart failure Non-obstructive CAD on cath couple of years  ago. CXR with pulmonary congestion, bnp elevated (this is chornic), this morning off o2, breathing comfortably, no edema - cont home metop, chlorthalidone, imdur - monitor  # Seizure disorcer Recent eeg neg for seizure - cont home depakote, reduce dose gabapentin - seizure precautions  # CKD3 Stable - monitor  # T2DM Hypoglycemia on presentation, resolved - cont home lantus 15 qhs - hold home met and novolog - SSI  # HTN Here bp wnl - cont home chlorthalidone, metoprolol, atorvastatin  # PAD (peripheral artery disease) Status post bilateral BKA, stumps clean - cont home atorva, xarelt  # OSA Not on cpap at home   Body mass index is 30.59 kg/m.  Nutrition Problem: Inadequate oral intake Etiology: acute illness Interventions:       Diet: Heart healthy/carb modified DVT Prophylaxis: Therapeutic Anticoagulation with Xarelto   Advance goals of care discussion: DNR  Family Communication: family was NOT present at bedside, at the time of interview.  The pt provided permission to discuss medical plan with the family. Opportunity was given to ask question and all questions were answered satisfactorily.   Disposition:  Pt is from home, admitted with hypoglycemia, A. fib with RVR, acute metabolic encephalopathy, which is resolved on 3/11 patient is stable for discharge to SNF when bed available 3/10 complain of chest pain, still in A. fib with RVR Cardiology consulted, awaiting for recommendation and then we will plan for disposition 3/10 peer-to-peer was done, insurance denied SNF placement due to medically unstable at this time 3/11 discussed with TOC to proceed with placement again  Subjective: No overnight issues, patient remained chest pain-free, heart rate is better controlled now, patient denied any active issues.  No chest pain or palpitations, no abdominal pain and no SOB.    Physical Exam: General:  alert and awake, c/o chest pain and he was requesting  nitroglycerin Appear in no distress, affect appropriate Eyes: PERRLA ENT: Oral Mucosa Clear, moist  Neck: no JVD,  Cardiovascular: S1 and S2 Present, no Murmur, irregular rhythm Respiratory: good respiratory effort, Bilateral Air entry equal, no Crackles, no wheezes Abdomen: Bowel Sound present, Soft and no tenderness,  Skin: no rashes Extremities: s/p BL BKA  Neurologic: without any new focal findings   Vitals:   08/01/20 0045 08/01/20 0452 08/01/20 0712 08/01/20 1152  BP:  (!) 128/52 (!) 150/66 (!) 157/91  Pulse:  (!) 58 71 (!) 56  Resp:  18 18   Temp:  97.8 F (36.6 C) (!) 97.4 F (36.3 C) (!) 97.5 F (36.4 C)  TempSrc:   Oral Oral  SpO2:  99% 99% 97%  Weight: 88 kg     Height:        Intake/Output Summary (Last 24 hours) at 08/01/2020 1344 Last data filed at 08/01/2020 1208 Gross per 24 hour  Intake 240 ml  Output 1650 ml  Net -1410 ml   Filed Weights   07/30/20 0356 07/31/20 0451 08/01/20 0045  Weight: 88.6 kg 88.5 kg 88 kg    Data Reviewed: I have personally reviewed and interpreted daily labs, tele strips, imagings as discussed above. I reviewed all nursing notes, pharmacy notes, vitals, pertinent old records I have discussed plan of care as described above with RN and patient/family.  CBC: Recent Labs  Lab 07/28/20 1255 07/29/20 1009 07/30/20 0523 07/31/20 0533 08/01/20 0658  WBC 7.4 8.7 9.6 7.8 8.0  NEUTROABS  4.1  --   --   --   --   HGB 13.3 14.0 14.4 15.2 14.0  HCT 39.5 42.5 43.7 44.9 42.6  MCV 89.8 90.8 89.7 89.1 90.6  PLT 211 224 231 202 250   Basic Metabolic Panel: Recent Labs  Lab 07/28/20 1255 07/29/20 1009 07/30/20 0523 07/31/20 0533 08/01/20 0658  NA 142 139 141 139 140  K 4.2 4.7 4.2 4.5 5.1  CL 109 102 105 106 105  CO2 '25 27 26 26 26  ' GLUCOSE 79 236* 97 105* 116*  BUN 40* 40* 44* 46* 53*  CREATININE 1.62* 1.62* 1.45* 1.44* 1.61*  CALCIUM 8.5* 8.7* 8.7* 8.6* 8.8*  MG 2.2  --   --  2.1  --   PHOS  --   --   --  3.7  --      Studies: ECHOCARDIOGRAM COMPLETE  Result Date: 08/01/2020    ECHOCARDIOGRAM REPORT   Patient Name:   [name] Date of Exam: 08/01/2020 Medical Rec #:  [id number]             Height:       67.0 in Accession #:    [id number]            Weight:       193.9 lb Date of Birth:  19-Jan-1935            BSA:          1.996 m Patient Age:    16 years              BP:           150/66 mmHg Patient Gender: M                     HR:           66 bpm. Exam Location:  [facility] Procedure: 2D Echo, Cardiac Doppler, Color Doppler and Intracardiac            Opacification Agent Indications:     R07.9 Chest Pain  History:         Patient has prior history of Echocardiogram examinations, most                  recent 11/30/2019. CHF, CKD; Risk Factors:Hypertension, Diabetes                  and Dyslipidemia. Hx of COVID-19 in 01/2019.  Sonographer:     Charmayne Sheer RDCS (AE) Referring Phys:  [id number] Val Riles Diagnosing Phys: Bartholome Bill MD  Sonographer Comments: Technically difficult study due to poor echo windows. Image acquisition challenging due to patient body habitus and Image acquisition challenging due to uncooperative patient. IMPRESSIONS  1. Left ventricular ejection fraction, by estimation, is 45 to 50%. The left ventricle has mildly decreased function. The left ventricle has no regional wall motion abnormalities. Left ventricular diastolic parameters are consistent with Grade I diastolic dysfunction (impaired relaxation).  2. Right ventricular systolic function is normal. The right ventricular size is normal.  3. Right atrial size was mildly dilated.  4. The mitral valve was not well visualized. Trivial mitral valve regurgitation.  5. The aortic valve was not well visualized. Aortic valve regurgitation is trivial.  6. Aortic dilatation noted. There is borderline dilatation of the aortic root, measuring 36 mm. FINDINGS  Left Ventricle: Left ventricular ejection fraction, by estimation, is 45 to 50%. The left  ventricle has mildly decreased function. The  left ventricle has no regional wall motion abnormalities. Definity contrast agent was given IV to delineate the left ventricular endocardial borders. The left ventricular internal cavity size was normal in size. There is no left ventricular hypertrophy. Left ventricular diastolic parameters are consistent with Grade I diastolic dysfunction (impaired relaxation). Right Ventricle: The right ventricular size is normal. No increase in right ventricular wall thickness. Right ventricular systolic function is normal. Left Atrium: Left atrial size was normal in size. Right Atrium: Right atrial size was mildly dilated. Pericardium: There is no evidence of pericardial effusion. Mitral Valve: The mitral valve was not well visualized. Trivial mitral valve regurgitation. MV peak gradient, 3.2 mmHg. The mean mitral valve gradient is 2.0 mmHg. Tricuspid Valve: The tricuspid valve is not well visualized. Tricuspid valve regurgitation is mild. Aortic Valve: The aortic valve was not well visualized. Aortic valve regurgitation is trivial. Aortic valve mean gradient measures 4.0 mmHg. Aortic valve peak gradient measures 7.0 mmHg. Aortic valve area, by VTI measures 3.07 cm. Pulmonic Valve: The pulmonic valve was not well visualized. Pulmonic valve regurgitation is trivial. Aorta: Aortic dilatation noted. There is borderline dilatation of the aortic root, measuring 36 mm. IAS/Shunts: The interatrial septum was not well visualized.  LEFT VENTRICLE PLAX 2D LVIDd:         6.30 cm      Diastology LVIDs:         4.95 cm      LV e' medial:    3.81 cm/s LV PW:         1.00 cm      LV E/e' medial:  15.9 LV IVS:        0.90 cm      LV e' lateral:   3.48 cm/s LVOT diam:     2.40 cm      LV E/e' lateral: 17.4 LV SV:         82 LV SV Index:   41 LVOT Area:     4.52 cm  LV Volumes (MOD) LV vol d, MOD A2C: 107.0 ml LV vol d, MOD A4C: 105.0 ml LV vol s, MOD A2C: 68.3 ml LV vol s, MOD A4C: 67.3 ml LV SV MOD  A2C:     38.7 ml LV SV MOD A4C:     105.0 ml LV SV MOD BP:      46.2 ml RIGHT VENTRICLE RV Basal diam:  3.40 cm LEFT ATRIUM             Index       RIGHT ATRIUM           Index LA diam:        5.70 cm 2.86 cm/m  RA Area:     15.00 cm LA Vol (A2C):   70.1 ml 35.12 ml/m RA Volume:   32.70 ml  16.38 ml/m LA Vol (A4C):   67.6 ml 33.87 ml/m LA Biplane Vol: 69.9 ml 35.02 ml/m  AORTIC VALVE AV Area (Vmax):    2.73 cm AV Area (Vmean):   2.76 cm AV Area (VTI):     3.07 cm AV Vmax:           132.00 cm/s AV Vmean:          89.700 cm/s AV VTI:            0.267 m AV Peak Grad:      7.0 mmHg AV Mean Grad:      4.0 mmHg LVOT Vmax:  79.60 cm/s LVOT Vmean:        54.700 cm/s LVOT VTI:          0.181 m LVOT/AV VTI ratio: 0.68  AORTA Ao Root diam: 3.60 cm MITRAL VALVE MV Area (PHT): 6.17 cm    SHUNTS MV Area VTI:   3.91 cm    Systemic VTI:  0.18 m MV Peak grad:  3.2 mmHg    Systemic Diam: 2.40 cm MV Mean grad:  2.0 mmHg MV Vmax:       0.89 m/s MV Vmean:      58.3 cm/s MV Decel Time: 123 msec MV E velocity: 60.40 cm/s MV A velocity: 95.55 cm/s MV E/A ratio:  0.63 Bartholome Bill MD Electronically signed by Bartholome Bill MD Signature Date/Time: 08/01/2020/1:09:25 PM    Final     Scheduled Meds: . atorvastatin  80 mg Oral QHS  . chlorthalidone  25 mg Oral Daily  . diltiazem  120 mg Oral Daily  . divalproex  500 mg Oral Daily  . ferrous sulfate  325 mg Oral Q breakfast  . gabapentin  100 mg Oral TID  . insulin aspart  0-15 Units Subcutaneous TID WC  . insulin aspart  0-5 Units Subcutaneous QHS  . insulin glargine  15 Units Subcutaneous QHS  . isosorbide mononitrate  60 mg Oral Daily  . metoprolol succinate  25 mg Oral Q breakfast  . multivitamin with minerals  1 tablet Oral Daily  . pantoprazole  40 mg Oral QHS  . Ensure Max Protein  11 oz Oral BID  . Rivaroxaban  15 mg Oral Q supper   Continuous Infusions: PRN Meds: acetaminophen **OR** acetaminophen, diltiazem, metoprolol tartrate, nitroGLYCERIN,  ondansetron **OR** ondansetron (ZOFRAN) IV  Time spent: 35 minutes  Author: Val Riles. MD [facility] 08/01/2020 1:44 PM  To reach On-call, see care teams to locate the attending and reach out to them via [email or web address]. If 7PM-7AM, please contact night-coverage If you still have difficulty reaching the attending provider, please page the [facility] (Director on Call) for [facility] on amion for assistance.

## 2020-08-01 NOTE — Progress Notes (Signed)
Physical Therapy Treatment Patient Details Name: [name] MRN: [id number] DOB: [birth year] Today's Date: 08/01/2020    History of Present Illness [name] is a 86 y.o. male with medical history significant for DM, HTN, PAD status post bilateral BKA, CAD, diastolic heart failure, CKD stage IIIa, paroxysmal A. fib on Xarelto, seizure disorder, history of UTIs, chronic pain on chronic opiates, hospitalized from 2/2-2/3 with altered mental status believed related to unintentional opiate overdose with suspected underlying dementia, and recently seen in the ER on 2/22 with dysuria with urine culture showing less than 10,000 colonies, who was brought to the emergency room with altered mental status, initially showing response to Narcan but with fluctuating mental status after several hours of observation in the emergency room.  History is limited due to persistent confusion.  Patient was apparently found to have decreased responsiveness from his baseline on arrival of his home health nurse and EMS was activated    PT Comments    Pt was long sitting in bed upon arriving. Bed linens soaked from pt's condom cath being removed. RN tech aware. Pt was agreeable to OOB activity. Needed min assist to properly apply prosthetics(bilateral). He achieve EOB short sit with min assist. Able to maintain balance, at EOB with close supervision. He proceeded to exit R side of bed with increased time and min assist. Used transfer board to gt out of bed to chair. Per pt," I used slide board all the time to get to W/C and in/out of car. Pt does require min assist for safety. Lunch tray arrived. RN staff aware and Pryor Curia will return to assist pt with returning to bed. Highly recommend continued skilled PT at SNF to address deficits while improving safety with ADLs.   Follow Up Recommendations  SNF;Supervision/Assistance - 24 hour     Equipment Recommendations  None recommended by PT     Recommendations for Other Services       Precautions / Restrictions Precautions Precautions: Fall Restrictions Weight Bearing Restrictions: Yes RLE Weight Bearing: Weight bearing as tolerated LLE Weight Bearing: Weight bearing as tolerated    Mobility  Bed Mobility Overal bed mobility: Needs Assistance Bed Mobility: Supine to Sit;Sit to Supine     Supine to sit: Min assist Sit to supine: Min assist   General bed mobility comments: min assist to safely achieve EOB short sitting    Transfers Overall transfer level: Needs assistance Equipment used: Sliding board Transfers: Lateral/Scoot Transfers          Lateral/Scoot Transfers: Min guard General transfer comment: CGA for safety  Ambulation/Gait      General Gait Details: unable at baseline per patient      Balance Overall balance assessment: Needs assistance Sitting-balance support: No upper extremity supported;Feet unsupported Sitting balance-Leahy Scale: Fair         Standing balance comment: Did not stand. Per pt only used slideboard to/from w/c and in/out of car.      Cognition Arousal/Alertness: Awake/alert Behavior During Therapy: WFL for tasks assessed/performed Overall Cognitive Status: No family/caregiver present to determine baseline cognitive functioning      General Comments: Pt is A but disoriented. Was able to follow commands consistently throughout.             Pertinent Vitals/Pain Pain Assessment: No/denies pain           PT Goals (current goals can now be found in the care plan section) Acute Rehab PT Goals Patient Stated Goal: to get stronger Progress  towards PT goals: Progressing toward goals    Frequency    Min 2X/week      PT Plan Current plan remains appropriate       AM-PAC PT "6 Clicks" Mobility   Outcome Measure  Help needed turning from your back to your side while in a flat bed without using bedrails?: A Little Help needed moving from lying on your  back to sitting on the side of a flat bed without using bedrails?: A Little Help needed moving to and from a bed to a chair (including a wheelchair)?: Total Help needed standing up from a chair using your arms (e.g., wheelchair or bedside chair)?: Total Help needed to walk in hospital room?: Total Help needed climbing 3-5 steps with a railing? : Total 6 Click Score: 10    End of Session Equipment Utilized During Treatment: Gait belt Activity Tolerance: Patient tolerated treatment well Patient left: in bed;with call bell/phone within reach;with bed alarm set Nurse Communication: Mobility status PT Visit Diagnosis: Other abnormalities of gait and mobility (R26.89);Muscle weakness (generalized) (M62.81)     Time: 7972-8206 PT Time Calculation (min) (ACUTE ONLY): 24 min  Charges:  $Therapeutic Activity: 23-37 mins                     Julaine Fusi PTA 08/01/20, 1:35 PM

## 2020-08-02 ENCOUNTER — Other Ambulatory Visit: Payer: Self-pay

## 2020-08-02 DIAGNOSIS — G9340 Encephalopathy, unspecified: Secondary | ICD-10-CM | POA: Diagnosis not present

## 2020-08-02 LAB — COMPREHENSIVE METABOLIC PANEL
ALT: 20 U/L (ref 0–44)
AST: 25 U/L (ref 15–41)
Albumin: 3.2 g/dL — ABNORMAL LOW (ref 3.5–5.0)
Alkaline Phosphatase: 92 U/L (ref 38–126)
Anion gap: 8 (ref 5–15)
BUN: 47 mg/dL — ABNORMAL HIGH (ref 8–23)
CO2: 25 mmol/L (ref 22–32)
Calcium: 8.7 mg/dL — ABNORMAL LOW (ref 8.9–10.3)
Chloride: 106 mmol/L (ref 98–111)
Creatinine, Ser: 1.55 mg/dL — ABNORMAL HIGH (ref 0.61–1.24)
GFR, Estimated: 44 mL/min — ABNORMAL LOW (ref >60)
Glucose, Bld: 141 mg/dL — ABNORMAL HIGH (ref 70–99)
Potassium: 4.7 mmol/L (ref 3.5–5.1)
Sodium: 139 mmol/L (ref 135–145)
Total Bilirubin: 0.8 mg/dL (ref 0.3–1.2)
Total Protein: 6.5 g/dL (ref 6.5–8.1)

## 2020-08-02 LAB — GLUCOSE, CAPILLARY
Glucose-Capillary: 129 mg/dL — ABNORMAL HIGH (ref 70–99)
Glucose-Capillary: 208 mg/dL — ABNORMAL HIGH (ref 70–99)
Glucose-Capillary: 135 mg/dL — ABNORMAL HIGH (ref 70–99)
Glucose-Capillary: 157 mg/dL — ABNORMAL HIGH (ref 70–99)

## 2020-08-02 LAB — CBC
HCT: 38.9 % — ABNORMAL LOW (ref 39.0–52.0)
Hemoglobin: 13.3 g/dL (ref 13.0–17.0)
MCH: 30.4 pg (ref 26.0–34.0)
MCHC: 34.2 g/dL (ref 30.0–36.0)
MCV: 88.8 fL (ref 80.0–100.0)
Platelets: 212 10*3/uL (ref 150–400)
RBC: 4.38 MIL/uL (ref 4.22–5.81)
RDW: 14.1 % (ref 11.5–15.5)
WBC: 7.9 10*3/uL (ref 4.0–10.5)
nRBC: 0 % (ref 0.0–0.2)

## 2020-08-02 NOTE — TOC Progression Note (Signed)
Transition of Care [facility]) - Progression Note    Patient Details  Name: [name] MRN: [id number] Date of Birth: 21-Jul-1934  Transition of Care [facility]) CM/SW Contact  Truitt Merle, LCSW Phone Number: 08/02/2020, 3:55 PM  Clinical Narrative:    TOC received chat from [name] stating patient's daughter requesting a call re:  discharge planning. [employer] health insurance portal checked; reflects still pending. Spoke with daughter [name] at [phone] via phone and updated, as well as provided phone number for [place] [employer] for [place] for the future. Updated RN [name]. TOC continuing to follow for discharge needs.        Barriers to Discharge: Insurance Authorization  Expected Discharge Plan and Services                                                 Social Determinants of Health (SDOH) Interventions    Readmission Risk Interventions Readmission Risk Prevention Plan 03/07/2020 02/12/2019 12/25/2018  Transportation Screening Complete - Complete  PCP or Specialist Appt within 3-5 Days Complete - Complete  HRI or Home Care Consult Complete - Complete  Social Work Consult for [place] Planning/Counseling Complete - Complete  Palliative Care Screening Not Applicable - Not Applicable  Medication Review [employer] photographer) Complete - Complete  PCP or Specialist appointment within 3-5 days of discharge - Not Complete -  PCP/Specialist Appt Not Complete comments - not ready for dc, family requests SNF placement -  HRI or Home Care Consult - Complete -  SW Recovery Care/Counseling Consult - Complete -  Palliative Care Screening - Not Applicable -  [place] - Complete -  Some recent data might be hidden

## 2020-08-02 NOTE — Progress Notes (Signed)
Patient Name: [name] Date of Encounter: 08/02/2020  Hospital Problem List     Active Problems:   S/P bilateral BKA (below knee amputation) (HCC)   Paroxysmal A-fib (HCC)   Seizure disorder (HCC)   CAD (coronary artery disease)   Acute toxic-metabolic encephalopathy   (HFpEF) heart failure with preserved ejection fraction (HCC)   AMS (altered mental status)   Chronic anticoagulation   Chronic kidney disease, stage 3a (HCC)   Lactic acidosis   Hypoxic   Chronic prescription opiate use   Elevated troponin   Acute encephalopathy    Patient Profile     86 y.o. male with history of diabetes, hypertension, peripheral artery disease status post bilateral BKA's, coronary artery disease, history of diastolic heart failure, chronic pain on chronic opiates who was admitted earlier last month with altered mental status felt to be related to unintentional opiate overdose who presented to the emergency room with altered mental status.  On admission, his EKG showed atrial fibrillation with controlled ventricular response.  Today he was noted to be tachycardic and EKG showed atrial fibrillation with rapid ventricular response.  Cardiology consultation was called.  He currently is being treated with Cardizem CD 120 mg daily, metoprolol succinate 25 mg daily.  Prior to admission he was on Lipitor 80 mg daily, chlorthalidone 25 mg daily, Cardizem 120 daily, Depakote 500 daily, gabapentin, insulin, Imdur 30 mg daily, metoprolol succinate 25 mg daily, rivaroxaban 15 mg daily.   Subjective   Very difficult historian.  Heart rate is somewhat improved.  Inpatient Medications    . atorvastatin  80 mg Oral QHS  . chlorthalidone  25 mg Oral Daily  . diltiazem  240 mg Oral Daily  . divalproex  500 mg Oral Daily  . ferrous sulfate  325 mg Oral Q breakfast  . gabapentin  100 mg Oral TID  . insulin aspart  0-15 Units Subcutaneous TID WC  . insulin aspart  0-5 Units Subcutaneous QHS  .  insulin glargine  15 Units Subcutaneous QHS  . isosorbide mononitrate  60 mg Oral Daily  . metoprolol succinate  25 mg Oral Q breakfast  . multivitamin with minerals  1 tablet Oral Daily  . pantoprazole  40 mg Oral QHS  . Ensure Max Protein  11 oz Oral BID  . Rivaroxaban  15 mg Oral Q supper    Vital Signs    Vitals:   08/02/20 0534 08/02/20 0732 08/02/20 1147 08/02/20 1543  BP: (!) 126/52 107/84 129/69 109/60  Pulse: 64 63 65 60  Resp: 18 20 18 16   Temp: 97.7 F (36.5 C) (!) 97.3 F (36.3 C) 97.6 F (36.4 C) 97.9 F (36.6 C)  TempSrc: Oral Oral    SpO2: 94% 97% 94% 92%  Weight:      Height:        Intake/Output Summary (Last 24 hours) at 08/02/2020 1552 Last data filed at 08/02/2020 0946 Gross per 24 hour  Intake 480 ml  Output 2400 ml  Net -1920 ml   Filed Weights   07/31/20 0451 08/01/20 0045 08/02/20 0003  Weight: 88.5 kg 88 kg 88.4 kg    Physical Exam    GEN: Well nourished, well developed, in no acute distress.  HEENT: normal.  Neck: Supple, no JVD, carotid bruits, or masses. Cardiac: Irregular regular rhythm.  Status post bilateral BKA's. Respiratory:  Respirations regular and unlabored, clear to auscultation bilaterally. GI: Soft, nontender, nondistended, BS + x 4. MS: no deformity or atrophy.  Skin: warm and dry, no rash. Neuro: Limited historian.  Very confused.  Labs    CBC Recent Labs    08/01/20 0658 08/02/20 0534  WBC 8.0 7.9  HGB 14.0 13.3  HCT 42.6 38.9*  MCV 90.6 88.8  PLT 226 376   Basic Metabolic Panel Recent Labs    07/31/20 0533 08/01/20 0658 08/02/20 0534  NA 139 140 139  K 4.5 5.1 4.7  CL 106 105 106  CO2 26 26 25   GLUCOSE 105* 116* 141*  BUN 46* 53* 47*  CREATININE 1.44* 1.61* 1.55*  CALCIUM 8.6* 8.8* 8.7*  MG 2.1  --   --   PHOS 3.7  --   --    Liver Function Tests Recent Labs    08/01/20 0658 08/02/20 0534  AST 28 25  ALT 24 20  ALKPHOS 87 92  BILITOT 0.9 0.8  PROT 7.1 6.5  ALBUMIN 3.5 3.2*   No  results for input(s): LIPASE, AMYLASE in the last 72 hours. Cardiac Enzymes No results for input(s): CKTOTAL, CKMB, CKMBINDEX, TROPONINI in the last 72 hours. BNP No results for input(s): BNP in the last 72 hours. D-Dimer No results for input(s): DDIMER in the last 72 hours. Hemoglobin A1C No results for input(s): HGBA1C in the last 72 hours. Fasting Lipid Panel No results for input(s): CHOL, HDL, LDLCALC, TRIG, CHOLHDL, LDLDIRECT in the last 72 hours. Thyroid Function Tests No results for input(s): TSH, T4TOTAL, T3FREE, THYROIDAB in the last 72 hours.  Invalid input(s): FREET3  Telemetry    afib with variable vr  ECG    afib with rvr  Radiology    CT Head Wo Contrast  Result Date: 07/28/2020 CLINICAL DATA:  Mental status changes. EXAM: CT HEAD WITHOUT CONTRAST TECHNIQUE: Contiguous axial images were obtained from the base of the skull through the vertex without intravenous contrast. COMPARISON:  MRI 06/26/2020.  CT scan 06/25/2020. FINDINGS: Brain: There is no evidence for acute hemorrhage, hydrocephalus, mass lesion, or abnormal extra-axial fluid collection. No definite CT evidence for acute infarction. Diffuse loss of parenchymal volume is consistent with atrophy. Patchy low attenuation in the deep hemispheric and periventricular white matter is nonspecific, but likely reflects chronic microvascular ischemic demyelination. Vascular: No hyperdense vessel or unexpected calcification. Skull: No evidence for fracture. No worrisome lytic or sclerotic lesion. Sinuses/Orbits: The visualized paranasal sinuses and mastoid air cells are clear. Visualized portions of the globes and intraorbital fat are unremarkable. Other: None. IMPRESSION: 1. No acute intracranial abnormality. 2. Atrophy with chronic small vessel white matter ischemic disease. Electronically Signed   By: Misty Stanley M.D.   On: 07/28/2020 13:35   DG Chest Portable 1 View  Result Date: 07/28/2020 CLINICAL DATA:  86 year old  with acute mental status changes and open coil bore responsiveness". Current history of CHF, hypertension. EXAM: PORTABLE CHEST 1 VIEW COMPARISON:  06/25/2020 and earlier, including CT chest 11/30/2019. FINDINGS: Cardiac silhouette moderately to markedly enlarged for AP portable technique, unchanged. Pulmonary venous hypertension and likely mild interstitial pulmonary edema. No confluent airspace consolidation. No visible pleural effusions. IMPRESSION: Mild CHF and/or fluid overload is suspected, with stable moderate to marked cardiomegaly. Electronically Signed   By: Evangeline Dakin M.D.   On: 07/28/2020 17:00   ECHOCARDIOGRAM COMPLETE  Result Date: 08/01/2020    ECHOCARDIOGRAM REPORT   Patient Name:   JALEEN FINCH Date of Exam: 08/01/2020 Medical Rec #:  [id number]             Height:  67.0 in Accession #:    [id number]            Weight:       193.9 lb Date of Birth:  Aug 23, 1934            BSA:          1.996 m Patient Age:    6 years              BP:           150/66 mmHg Patient Gender: M                     HR:           66 bpm. Exam Location:  [facility] Procedure: 2D Echo, Cardiac Doppler, Color Doppler and Intracardiac            Opacification Agent Indications:     R07.9 Chest Pain  History:         Patient has prior history of Echocardiogram examinations, most                  recent 11/30/2019. CHF, CKD; Risk Factors:Hypertension, Diabetes                  and Dyslipidemia. Hx of COVID-19 in 01/2019.  Sonographer:     Charmayne Sheer RDCS (AE) Referring Phys:  [id number] Val Riles Diagnosing Phys: Bartholome Bill MD  Sonographer Comments: Technically difficult study due to poor echo windows. Image acquisition challenging due to patient body habitus and Image acquisition challenging due to uncooperative patient. IMPRESSIONS  1. Left ventricular ejection fraction, by estimation, is 45 to 50%. The left ventricle has mildly decreased function. The left ventricle has no regional wall motion abnormalities.  Left ventricular diastolic parameters are consistent with Grade I diastolic dysfunction (impaired relaxation).  2. Right ventricular systolic function is normal. The right ventricular size is normal.  3. Right atrial size was mildly dilated.  4. The mitral valve was not well visualized. Trivial mitral valve regurgitation.  5. The aortic valve was not well visualized. Aortic valve regurgitation is trivial.  6. Aortic dilatation noted. There is borderline dilatation of the aortic root, measuring 36 mm. FINDINGS  Left Ventricle: Left ventricular ejection fraction, by estimation, is 45 to 50%. The left ventricle has mildly decreased function. The left ventricle has no regional wall motion abnormalities. Definity contrast agent was given IV to delineate the left ventricular endocardial borders. The left ventricular internal cavity size was normal in size. There is no left ventricular hypertrophy. Left ventricular diastolic parameters are consistent with Grade I diastolic dysfunction (impaired relaxation). Right Ventricle: The right ventricular size is normal. No increase in right ventricular wall thickness. Right ventricular systolic function is normal. Left Atrium: Left atrial size was normal in size. Right Atrium: Right atrial size was mildly dilated. Pericardium: There is no evidence of pericardial effusion. Mitral Valve: The mitral valve was not well visualized. Trivial mitral valve regurgitation. MV peak gradient, 3.2 mmHg. The mean mitral valve gradient is 2.0 mmHg. Tricuspid Valve: The tricuspid valve is not well visualized. Tricuspid valve regurgitation is mild. Aortic Valve: The aortic valve was not well visualized. Aortic valve regurgitation is trivial. Aortic valve mean gradient measures 4.0 mmHg. Aortic valve peak gradient measures 7.0 mmHg. Aortic valve area, by VTI measures 3.07 cm. Pulmonic Valve: The pulmonic valve was not well visualized. Pulmonic valve regurgitation is trivial. Aorta: Aortic dilatation  noted. There is borderline dilatation of the  aortic root, measuring 36 mm. IAS/Shunts: The interatrial septum was not well visualized.  LEFT VENTRICLE PLAX 2D LVIDd:         6.30 cm      Diastology LVIDs:         4.95 cm      LV e' medial:    3.81 cm/s LV PW:         1.00 cm      LV E/e' medial:  15.9 LV IVS:        0.90 cm      LV e' lateral:   3.48 cm/s LVOT diam:     2.40 cm      LV E/e' lateral: 17.4 LV SV:         82 LV SV Index:   41 LVOT Area:     4.52 cm  LV Volumes (MOD) LV vol d, MOD A2C: 107.0 ml LV vol d, MOD A4C: 105.0 ml LV vol s, MOD A2C: 68.3 ml LV vol s, MOD A4C: 67.3 ml LV SV MOD A2C:     38.7 ml LV SV MOD A4C:     105.0 ml LV SV MOD BP:      46.2 ml RIGHT VENTRICLE RV Basal diam:  3.40 cm LEFT ATRIUM             Index       RIGHT ATRIUM           Index LA diam:        5.70 cm 2.86 cm/m  RA Area:     15.00 cm LA Vol (A2C):   70.1 ml 35.12 ml/m RA Volume:   32.70 ml  16.38 ml/m LA Vol (A4C):   67.6 ml 33.87 ml/m LA Biplane Vol: 69.9 ml 35.02 ml/m  AORTIC VALVE AV Area (Vmax):    2.73 cm AV Area (Vmean):   2.76 cm AV Area (VTI):     3.07 cm AV Vmax:           132.00 cm/s AV Vmean:          89.700 cm/s AV VTI:            0.267 m AV Peak Grad:      7.0 mmHg AV Mean Grad:      4.0 mmHg LVOT Vmax:         79.60 cm/s LVOT Vmean:        54.700 cm/s LVOT VTI:          0.181 m LVOT/AV VTI ratio: 0.68  AORTA Ao Root diam: 3.60 cm MITRAL VALVE MV Area (PHT): 6.17 cm    SHUNTS MV Area VTI:   3.91 cm    Systemic VTI:  0.18 m MV Peak grad:  3.2 mmHg    Systemic Diam: 2.40 cm MV Mean grad:  2.0 mmHg MV Vmax:       0.89 m/s MV Vmean:      58.3 cm/s MV Decel Time: 123 msec MV E velocity: 60.40 cm/s MV A velocity: 95.55 cm/s MV E/A ratio:  0.63 Bartholome Bill MD Electronically signed by Bartholome Bill MD Signature Date/Time: 08/01/2020/1:09:25 PM    Final     Assessment & Plan    S/P bilateral BKA (below knee amputation) (HCC) Paroxysmal A-fib (HCC) Seizure disorder (HCC) CAD (coronary artery  disease) Acute toxic-metabolic encephalopathy (HFpEF) heart failure with preserved ejection fraction (HCC) AMS (altered mental status) Chronic anticoagulation Chronic kidney disease, stage 3a (HCC) Lactic acidosis Hypoxic Chronic prescription  opiate use Elevated troponin  1.  Paroxysmal atrial fibrillation-currently in atrial fibrillation with variable and relatively rapid ventricular response.    We will continue with diltiazem at 240 daily and follow. Would continue with metoprolol and Xarelto.  2.  Encephalopathy-we will continue to follow.  Agree with changes his medications and following.    Slightly less confused today.  3.  Elevated troponin-does not appear to be as due to acute coronary syndrome.  Likely demand.  Not a candidate for invasive evaluation.  Had nonobstructive coronary disease by cath fairly recently.  4.  CKD-stable.  Will follow.  Signed, Javier Docker Fath MD 08/02/2020, 3:52 PM  Pager: [phone]

## 2020-08-02 NOTE — Plan of Care (Signed)
  Problem: Education: Goal: Knowledge of General Education information will improve Description Including pain rating scale, medication(s)/side effects and non-pharmacologic comfort measures Outcome: Progressing   

## 2020-08-02 NOTE — Progress Notes (Signed)
PROGRESS NOTE    [name]  HGD:[id number] DOB: 20-Jun-1934 DOA: 07/28/2020 PCP: [place]   Chief Complain: Altered mental status  Brief Narrative: Hilary Pundt a 86 y.o.malewith medical history significant forDM, HTN, PAD status post bilateral BKA, CAD, diastolic heart failure, CKD stage IIIa, paroxysmal A. fib on Xarelto, seizure disorder, history of UTIs, chronic pain on chronic opiates, hospitalized from 2/2-2/3 with altered mental status believed related to unintentional opiate overdose with suspected underlying dementia, and recently seen in the ER on 2/22 with dysuria with urine culture showing less than 10,000 colonies, who was brought to the emergency room with altered mental status, initially showing response to Narcan but with fluctuating mental status after several hours of observation in the emergency room.  His altered mental status was thought to be secondary to hypoglycemia.  Hospital course remarkable for cardiology was following. Currently hemodynamically stable.  Plan for skilled facility placement,SW following  Assessment & Plan:   Active Problems:   S/P bilateral BKA (below knee amputation) (HCC)   Paroxysmal A-fib (HCC)   Seizure disorder (HCC)   CAD (coronary artery disease)   Acute toxic-metabolic encephalopathy   (HFpEF) heart failure with preserved ejection fraction (HCC)   AMS (altered mental status)   Chronic anticoagulation   Chronic kidney disease, stage 3a (HCC)   Lactic acidosis   Hypoxic   Chronic prescription opiate use   Elevated troponin   Acute encephalopathy   Altered mental status: Thought to be due to metabolic encephalopathy from hypoglycemia.  Patient is still altered and is currently oriented to place only. He was admitted about a month ago for similar reason, work-up at that time was negative and he had improvement. He was hypoglycemic on presentation.  Currently blood sugars are normal.  CT head did not  show any acute intake abnormalities.  No signs of UTI.  UDS negative.  Home gabapentin dose decreased. Monitor mental status.  He most likely has underlying dementia.  As per his wife, he has been confused on and off and more frequently since last few months.  Continue delirium precautions.  Continue supportive care  A. fib with RVR/chronic A. fib: Cardiology was following.  Hospital course remarkable for A. fib with RVR.  Currently on diltiazem, metoprolol, Xarelto.  Continue current medications.  Monitor on telemetry.  Elevated troponin: History of nonobstructive CAD.  Troponins are chronically elevated, flat trend.  Cardiology did not recommend any ischemic work-up.  Acute hypoxic respiratory failure: Resolved.  Currently on room air  Chronic diastolic CH failure: Currently euvolemic.  Continue current medications  Seizure disorder: On Depakote which we will continue.  Also on gabapentin at home, continue at reduced dose.  Seizure precautions  CKD stage IIIb: Stable  Diabetes type 2: Hypoglycemic on presentation.  Resolved.  Continue current insulin regimen.  Monitor blood sugars  Hypertension: Currently blood stable.  Continue current medications  Peripheral artery disease: Status post bilateral BKA.  Continue atorvastatin, Xarelto  OSA: Not on CPAP  Disposition: PT recommended discussing facility.  Pending disposition, difficult placement due to insurance issues    Nutrition Problem: Inadequate oral intake Etiology: acute illness      DVT prophylaxis:Xarelto Code Status: DNR Family Communication: Discussed with wife on phone on 08/02/2020 Status is: Inpatient  Remains inpatient appropriate because:Unsafe d/c plan   Dispo: The patient is from: Home              Anticipated d/c is to: SNF  Patient currently is medically stable to d/c.   Difficult to place patient Yes    Consultants: Cardiology  Procedures:None  Antimicrobials:  Anti-infectives (From  admission, onward)   None      Subjective: Patient seen and examined at the bedside this morning.  Currently hemodynamically stable.  Comfortable.  Not oriented to time, answers all the questions, alert and communicative.  Denies any complaints  Objective: Vitals:   08/01/20 1916 08/02/20 0003 08/02/20 0534 08/02/20 0732  BP: 106/71 (!) 152/62 (!) 126/52 107/84  Pulse: (!) 59 (!) 59 64 63  Resp: 17 18 18 20   Temp: 98.4 F (36.9 C) 98.3 F (36.8 C) 97.7 F (36.5 C) (!) 97.3 F (36.3 C)  TempSrc: Oral Oral Oral Oral  SpO2: 94% 95% 94% 97%  Weight:  88.4 kg    Height:  5\' 7"  (1.702 m)      Intake/Output Summary (Last 24 hours) at 08/02/2020 6962 Last data filed at 08/02/2020 9528 Gross per 24 hour  Intake 240 ml  Output 2300 ml  Net -2060 ml   Filed Weights   07/31/20 0451 08/01/20 0045 08/02/20 0003  Weight: 88.5 kg 88 kg 88.4 kg    Examination:  General exam: Appears calm and comfortable ,Not in distress,average built HEENT:PERRL,Oral mucosa moist, Ear/Nose normal on gross exam Respiratory system: Bilateral equal air entry, normal vesicular breath sounds, no wheezes or crackles  Cardiovascular system: S1 & S2 heard, RRR. No JVD, murmurs, rubs, gallops or clicks. No pedal edema. Gastrointestinal system: Abdomen is nondistended, soft and nontender. No organomegaly or masses felt. Normal bowel sounds heard. Central nervous system: Alert and oriented to place only . Extremities: No edema, no clubbing ,no cyanosis Skin: No rashes, lesions or ulcers,no icterus ,no pallor MSK: B/L BKA     Data Reviewed: I have personally reviewed following labs and imaging studies  CBC: Recent Labs  Lab 07/28/20 1255 07/29/20 1009 07/30/20 0523 07/31/20 0533 08/01/20 0658 08/02/20 0534  WBC 7.4 8.7 9.6 7.8 8.0 7.9  NEUTROABS 4.1  --   --   --   --   --   HGB 13.3 14.0 14.4 15.2 14.0 13.3  HCT 39.5 42.5 43.7 44.9 42.6 38.9*  MCV 89.8 90.8 89.7 89.1 90.6 88.8  PLT 211 224 231  202 226 413   Basic Metabolic Panel: Recent Labs  Lab 07/28/20 1255 07/29/20 1009 07/30/20 0523 07/31/20 0533 08/01/20 0658 08/02/20 0534  NA 142 139 141 139 140 139  K 4.2 4.7 4.2 4.5 5.1 4.7  CL 109 102 105 106 105 106  CO2 25 27 26 26 26 25   GLUCOSE 79 236* 97 105* 116* 141*  BUN 40* 40* 44* 46* 53* 47*  CREATININE 1.62* 1.62* 1.45* 1.44* 1.61* 1.55*  CALCIUM 8.5* 8.7* 8.7* 8.6* 8.8* 8.7*  MG 2.2  --   --  2.1  --   --   PHOS  --   --   --  3.7  --   --    GFR: Estimated Creatinine Clearance: 38.3 mL/min (A) (by C-G formula based on SCr of 1.55 mg/dL (H)). Liver Function Tests: Recent Labs  Lab 07/29/20 1009 07/30/20 0523 07/31/20 0533 08/01/20 0658 08/02/20 0534  AST 40 32 26 28 25   ALT 28 24 22 24 20   ALKPHOS 96 93 85 87 92  BILITOT 0.8 0.6 0.9 0.9 0.8  PROT 6.6 7.0 6.7 7.1 6.5  ALBUMIN 3.2* 3.3* 3.1* 3.5 3.2*   No results for input(s):  LIPASE, AMYLASE in the last 168 hours. No results for input(s): AMMONIA in the last 168 hours. Coagulation Profile: No results for input(s): INR, PROTIME in the last 168 hours. Cardiac Enzymes: No results for input(s): CKTOTAL, CKMB, CKMBINDEX, TROPONINI in the last 168 hours. BNP (last 3 results) No results for input(s): PROBNP in the last 8760 hours. HbA1C: No results for input(s): HGBA1C in the last 72 hours. CBG: Recent Labs  Lab 08/01/20 0728 08/01/20 1201 08/01/20 1618 08/01/20 2053 08/02/20 0802  GLUCAP 122* 152* 135* 173* 129*   Lipid Profile: No results for input(s): CHOL, HDL, LDLCALC, TRIG, CHOLHDL, LDLDIRECT in the last 72 hours. Thyroid Function Tests: No results for input(s): TSH, T4TOTAL, FREET4, T3FREE, THYROIDAB in the last 72 hours. Anemia Panel: No results for input(s): VITAMINB12, FOLATE, FERRITIN, TIBC, IRON, RETICCTPCT in the last 72 hours. Sepsis Labs: Recent Labs  Lab 07/28/20 1255 07/28/20 1500 07/29/20 0939  PROCALCITON 0.10  --   --   LATICACIDVEN 2.2* 2.6* 2.6*    Recent  Results (from the past 240 hour(s))  Urine Culture     Status: None   Collection Time: 07/28/20 12:55 PM   Specimen: Urine, Random  Result Value Ref Range Status   Specimen Description   Final    URINE, RANDOM Performed at [facility], [address] [place], [place] [address]    Special Requests   Final    NONE Performed at [facility], [address], [place], [place] [address]    Culture   Final    NO GROWTH Performed at [facility], [place] [address] [place], [place] [address]    Report Status 07/30/2020 FINAL  Final  SARS CORONAVIRUS 2 (TAT 6-24 HRS) Nasopharyngeal Nasopharyngeal Swab     Status: None   Collection Time: 07/28/20  1:57 PM   Specimen: Nasopharyngeal Swab  Result Value Ref Range Status   SARS Coronavirus 2 NEGATIVE NEGATIVE Final    Comment: (NOTE) SARS-CoV-2 target nucleic acids are NOT DETECTED.  The SARS-CoV-2 RNA is generally detectable in upper and lower respiratory specimens during the acute phase of infection. Negative results do not preclude SARS-CoV-2 infection, do not rule out co-infections with other pathogens, and should not be used as the sole basis for treatment or other patient management decisions. Negative results must be combined with clinical observations, patient history, and epidemiological information. The expected result is Negative.  Fact Sheet for Patients: SugarRoll.be  Fact Sheet for Healthcare Providers: https://www.woods-mathews.com/  This test is not yet approved or cleared by the [place] FDA and  has been authorized for detection and/or diagnosis of SARS-CoV-2 by FDA under an Emergency Use Authorization (EUA). This EUA will remain  in effect (meaning this test can be used) for the duration of the COVID-19 declaration under Se ction 564(b)(1) of the Act, 21 U.S.C. section 360bbb-3(b)(1), unless the authorization is terminated or revoked  sooner.  Performed at [facility], [place] [address], [place], [place] [address]   Blood culture (routine x 2)     Status: Abnormal   Collection Time: 07/28/20  7:56 PM   Specimen: BLOOD  Result Value Ref Range Status   Specimen Description   Final    BLOOD LEFT AC Performed at [facility], [address] [place],  [address]    Special Requests   Final    BOTTLES DRAWN AEROBIC AND ANAEROBIC Blood Culture results may not be optimal due to an inadequate volume of blood  received in culture bottles Performed at [facility], [address] [place], [place] [address]    Culture  Setup Time   Final    GRAM POSITIVE RODS ANAEROBIC BOTTLE ONLY CRITICAL RESULT CALLED TO, READ BACK BY AND VERIFIED WITH: NATHAN BELUE AT 7672 07/29/20 SDR Organism ID to follow [place] CRITICAL RESULT CALLED TO, READ BACK BY AND VERIFIED WITH: SUSAN WATSON ON 07/29/20 AT 1200 QSD Performed at [facility], [address] [place], [place] [address]    Culture (A)  Final    STAPHYLOCOCCUS HOMINIS BACILLUS SPECIES THE SIGNIFICANCE OF ISOLATING THIS ORGANISM FROM A SINGLE SET OF BLOOD CULTURES WHEN MULTIPLE SETS ARE DRAWN IS UNCERTAIN. PLEASE NOTIFY THE MICROBIOLOGY DEPARTMENT WITHIN ONE WEEK IF SPECIATION AND SENSITIVITIES ARE REQUIRED. Performed at [facility], [place] [address], [place], [place] [address]    Report Status 08/01/2020 FINAL  Final  Blood culture (routine x 2)     Status: None (Preliminary result)   Collection Time: 07/28/20  7:56 PM   Specimen: BLOOD  Result Value Ref Range Status   Specimen Description   Final    BLOOD RIGHT HAND Performed at [facility], [address] [place], [place] [address]    Special Requests   Final    BOTTLES DRAWN AEROBIC AND ANAEROBIC Blood Culture results may not be optimal due to an inadequate volume of blood received in culture bottles Performed at [facility], [address] [place], [place] [address]    Culture  Setup Time   Final    AEROBIC BOTTLE ONLY GRAM POSITIVE RODS GRAM POSITIVE COCCI CRITICAL VALUE NOTED.  VALUE IS CONSISTENT WITH PREVIOUSLY REPORTED AND CALLED VALUE. Performed at [facility], [address], [place], [place] [address]    Culture   Final    CULTURE REINCUBATED FOR BETTER GROWTH Performed at [facility], [place] [address] [place],  [address]    Report Status PENDING  Incomplete  Blood Culture ID Panel (Reflexed)     Status: Abnormal   Collection Time: 07/28/20  7:56 PM  Result Value Ref Range Status   Enterococcus faecalis NOT DETECTED NOT DETECTED Final   Enterococcus Faecium NOT DETECTED NOT DETECTED Final   Listeria monocytogenes NOT DETECTED NOT DETECTED Final   Staphylococcus species DETECTED (A) NOT DETECTED Final    Comment: CRITICAL RESULT CALLED TO, READ BACK BY AND VERIFIED WITH: SUSAN WATSON ON 07/29/20 AT 1200 QSD    Staphylococcus aureus (BCID) NOT DETECTED NOT DETECTED Final   Staphylococcus epidermidis DETECTED (A) NOT DETECTED Final    Comment: Methicillin (oxacillin) resistant coagulase negative staphylococcus. Possible blood culture contaminant (unless isolated from more than one blood culture draw or clinical case suggests pathogenicity). No antibiotic treatment is indicated for blood  culture contaminants. CRITICAL RESULT CALLED TO, READ BACK BY AND VERIFIED WITH: SUSAN WATSON ON 07/29/20 AT 1200 QSD    Staphylococcus lugdunensis NOT DETECTED NOT DETECTED Final   Streptococcus species NOT DETECTED NOT DETECTED Final   Streptococcus agalactiae NOT DETECTED NOT DETECTED Final   Streptococcus pneumoniae NOT DETECTED NOT DETECTED Final   Streptococcus pyogenes NOT DETECTED NOT DETECTED Final   A.calcoaceticus-baumannii NOT DETECTED NOT DETECTED Final   Bacteroides fragilis NOT DETECTED NOT DETECTED Final   Enterobacterales NOT DETECTED NOT DETECTED Final    Enterobacter cloacae complex NOT DETECTED NOT DETECTED Final   Escherichia coli NOT DETECTED NOT DETECTED Final   Klebsiella aerogenes  NOT DETECTED NOT DETECTED Final   Klebsiella oxytoca NOT DETECTED NOT DETECTED Final   Klebsiella pneumoniae NOT DETECTED NOT DETECTED Final   Proteus species NOT DETECTED NOT DETECTED Final   Salmonella species NOT DETECTED NOT DETECTED Final   Serratia marcescens NOT DETECTED NOT DETECTED Final   Haemophilus influenzae NOT DETECTED NOT DETECTED Final   Neisseria meningitidis NOT DETECTED NOT DETECTED Final   Pseudomonas aeruginosa NOT DETECTED NOT DETECTED Final   Stenotrophomonas maltophilia NOT DETECTED NOT DETECTED Final   Candida albicans NOT DETECTED NOT DETECTED Final   Candida auris NOT DETECTED NOT DETECTED Final   Candida glabrata NOT DETECTED NOT DETECTED Final   Candida krusei NOT DETECTED NOT DETECTED Final   Candida parapsilosis NOT DETECTED NOT DETECTED Final   Candida tropicalis NOT DETECTED NOT DETECTED Final   Cryptococcus neoformans/gattii NOT DETECTED NOT DETECTED Final   Methicillin resistance mecA/C DETECTED (A) NOT DETECTED Final    Comment: CRITICAL RESULT CALLED TO, READ BACK BY AND VERIFIED WITH: SUSAN WATSON ON 07/29/20 AT 1200 QSD Performed at [facility], [address] [place], [place] [address]   CULTURE, BLOOD (ROUTINE X 2) w Reflex to ID Panel     Status: None (Preliminary result)   Collection Time: 07/31/20  1:09 PM   Specimen: BLOOD  Result Value Ref Range Status   Specimen Description BLOOD BLOOD LEFT HAND  Final   Special Requests   Final    BOTTLES DRAWN AEROBIC AND ANAEROBIC Blood Culture adequate volume   Culture   Final    NO GROWTH 2 DAYS Performed at [facility], [address] [place], [place] [address]    Report Status PENDING  Incomplete  CULTURE, BLOOD (ROUTINE X 2) w Reflex to ID Panel     Status: None (Preliminary result)   Collection Time: 07/31/20  1:18 PM   Specimen: BLOOD   Result Value Ref Range Status   Specimen Description BLOOD RIGHT ANTECUBITAL  Final   Special Requests   Final    BOTTLES DRAWN AEROBIC ONLY Blood Culture results may not be optimal due to an inadequate volume of blood received in culture bottles   Culture   Final    NO GROWTH 2 DAYS Performed at [facility], [address], [place], [place] [address]    Report Status PENDING  Incomplete         Radiology Studies: ECHOCARDIOGRAM COMPLETE  Result Date: 08/01/2020    ECHOCARDIOGRAM REPORT   Patient Name:   KAVI ALMQUIST Date of Exam: 08/01/2020 Medical Rec #:  [id number]             Height:       67.0 in Accession #:    [id number]            Weight:       193.9 lb Date of Birth:  21-Mar-1935            BSA:          1.996 m Patient Age:    30 years              BP:           150/66 mmHg Patient Gender: M                     HR:           66 bpm. Exam Location:  [facility] Procedure: 2D Echo, Cardiac Doppler, Color Doppler and Intracardiac  Opacification Agent Indications:     R07.9 Chest Pain  History:         Patient has prior history of Echocardiogram examinations, most                  recent 11/30/2019. CHF, CKD; Risk Factors:Hypertension, Diabetes                  and Dyslipidemia. Hx of COVID-19 in 01/2019.  Sonographer:     Charmayne Sheer RDCS (AE) Referring Phys:  [id number] Val Riles Diagnosing Phys: Bartholome Bill MD  Sonographer Comments: Technically difficult study due to poor echo windows. Image acquisition challenging due to patient body habitus and Image acquisition challenging due to uncooperative patient. IMPRESSIONS  1. Left ventricular ejection fraction, by estimation, is 45 to 50%. The left ventricle has mildly decreased function. The left ventricle has no regional wall motion abnormalities. Left ventricular diastolic parameters are consistent with Grade I diastolic dysfunction (impaired relaxation).  2. Right ventricular systolic function is normal. The right  ventricular size is normal.  3. Right atrial size was mildly dilated.  4. The mitral valve was not well visualized. Trivial mitral valve regurgitation.  5. The aortic valve was not well visualized. Aortic valve regurgitation is trivial.  6. Aortic dilatation noted. There is borderline dilatation of the aortic root, measuring 36 mm. FINDINGS  Left Ventricle: Left ventricular ejection fraction, by estimation, is 45 to 50%. The left ventricle has mildly decreased function. The left ventricle has no regional wall motion abnormalities. Definity contrast agent was given IV to delineate the left ventricular endocardial borders. The left ventricular internal cavity size was normal in size. There is no left ventricular hypertrophy. Left ventricular diastolic parameters are consistent with Grade I diastolic dysfunction (impaired relaxation). Right Ventricle: The right ventricular size is normal. No increase in right ventricular wall thickness. Right ventricular systolic function is normal. Left Atrium: Left atrial size was normal in size. Right Atrium: Right atrial size was mildly dilated. Pericardium: There is no evidence of pericardial effusion. Mitral Valve: The mitral valve was not well visualized. Trivial mitral valve regurgitation. MV peak gradient, 3.2 mmHg. The mean mitral valve gradient is 2.0 mmHg. Tricuspid Valve: The tricuspid valve is not well visualized. Tricuspid valve regurgitation is mild. Aortic Valve: The aortic valve was not well visualized. Aortic valve regurgitation is trivial. Aortic valve mean gradient measures 4.0 mmHg. Aortic valve peak gradient measures 7.0 mmHg. Aortic valve area, by VTI measures 3.07 cm. Pulmonic Valve: The pulmonic valve was not well visualized. Pulmonic valve regurgitation is trivial. Aorta: Aortic dilatation noted. There is borderline dilatation of the aortic root, measuring 36 mm. IAS/Shunts: The interatrial septum was not well visualized.  LEFT VENTRICLE PLAX 2D LVIDd:          6.30 cm      Diastology LVIDs:         4.95 cm      LV e' medial:    3.81 cm/s LV PW:         1.00 cm      LV E/e' medial:  15.9 LV IVS:        0.90 cm      LV e' lateral:   3.48 cm/s LVOT diam:     2.40 cm      LV E/e' lateral: 17.4 LV SV:         82 LV SV Index:   41 LVOT Area:     4.52 cm  LV Volumes (  MOD) LV vol d, MOD A2C: 107.0 ml LV vol d, MOD A4C: 105.0 ml LV vol s, MOD A2C: 68.3 ml LV vol s, MOD A4C: 67.3 ml LV SV MOD A2C:     38.7 ml LV SV MOD A4C:     105.0 ml LV SV MOD BP:      46.2 ml RIGHT VENTRICLE RV Basal diam:  3.40 cm LEFT ATRIUM             Index       RIGHT ATRIUM           Index LA diam:        5.70 cm 2.86 cm/m  RA Area:     15.00 cm LA Vol (A2C):   70.1 ml 35.12 ml/m RA Volume:   32.70 ml  16.38 ml/m LA Vol (A4C):   67.6 ml 33.87 ml/m LA Biplane Vol: 69.9 ml 35.02 ml/m  AORTIC VALVE AV Area (Vmax):    2.73 cm AV Area (Vmean):   2.76 cm AV Area (VTI):     3.07 cm AV Vmax:           132.00 cm/s AV Vmean:          89.700 cm/s AV VTI:            0.267 m AV Peak Grad:      7.0 mmHg AV Mean Grad:      4.0 mmHg LVOT Vmax:         79.60 cm/s LVOT Vmean:        54.700 cm/s LVOT VTI:          0.181 m LVOT/AV VTI ratio: 0.68  AORTA Ao Root diam: 3.60 cm MITRAL VALVE MV Area (PHT): 6.17 cm    SHUNTS MV Area VTI:   3.91 cm    Systemic VTI:  0.18 m MV Peak grad:  3.2 mmHg    Systemic Diam: 2.40 cm MV Mean grad:  2.0 mmHg MV Vmax:       0.89 m/s MV Vmean:      58.3 cm/s MV Decel Time: 123 msec MV E velocity: 60.40 cm/s MV A velocity: 95.55 cm/s MV E/A ratio:  0.63 Bartholome Bill MD Electronically signed by Bartholome Bill MD Signature Date/Time: 08/01/2020/1:09:25 PM    Final         Scheduled Meds: . atorvastatin  80 mg Oral QHS  . chlorthalidone  25 mg Oral Daily  . diltiazem  240 mg Oral Daily  . divalproex  500 mg Oral Daily  . ferrous sulfate  325 mg Oral Q breakfast  . gabapentin  100 mg Oral TID  . insulin aspart  0-15 Units Subcutaneous TID WC  . insulin aspart  0-5 Units  Subcutaneous QHS  . insulin glargine  15 Units Subcutaneous QHS  . isosorbide mononitrate  60 mg Oral Daily  . metoprolol succinate  25 mg Oral Q breakfast  . multivitamin with minerals  1 tablet Oral Daily  . pantoprazole  40 mg Oral QHS  . Ensure Max Protein  11 oz Oral BID  . Rivaroxaban  15 mg Oral Q supper   Continuous Infusions:   LOS: 4 days    Time spent: 35 mins.More than 50% of that time was spent in counseling and/or coordination of care.      Shelly Coss, MD [facility] P3/04/2021, 8:33 AM

## 2020-08-03 DIAGNOSIS — G9340 Encephalopathy, unspecified: Secondary | ICD-10-CM | POA: Diagnosis not present

## 2020-08-03 LAB — GLUCOSE, CAPILLARY
Glucose-Capillary: 222 mg/dL — ABNORMAL HIGH (ref 70–99)
Glucose-Capillary: 169 mg/dL — ABNORMAL HIGH (ref 70–99)
Glucose-Capillary: 100 mg/dL — ABNORMAL HIGH (ref 70–99)
Glucose-Capillary: 191 mg/dL — ABNORMAL HIGH (ref 70–99)

## 2020-08-03 NOTE — Progress Notes (Signed)
PROGRESS NOTE    [name]  PFX:[id number] DOB: 1934/06/27 DOA: 07/28/2020 PCP: [facility]   Chief Complain: Altered mental status  Brief Narrative: [name] a 86 y.o.malewith medical history significant forDM, HTN, PAD status post bilateral BKA, CAD, diastolic heart failure, CKD stage IIIa, paroxysmal A. fib on Xarelto, seizure disorder, history of UTIs, chronic pain on chronic opiates, hospitalized from 2/2-2/3 with altered mental status believed related to unintentional opiate overdose with suspected underlying dementia, and recently seen in the ER on 2/22 with dysuria with urine culture showing less than 10,000 colonies, who was brought to the emergency room with altered mental status, initially showing response to Narcan but with fluctuating mental status after several hours of observation in the emergency room.  His altered mental status was thought to be secondary to hypoglycemia.  Hospital course remarkable for afib with RVR, cardiology was following. Currently hemodynamically stable. Mental status has improved,now close to baseline. Plan for skilled facility placement,SW following, patient is medically stable for discharge as soon as bed is available.  Assessment & Plan:   Active Problems:   S/P bilateral BKA (below knee amputation) (HCC)   Paroxysmal A-fib (HCC)   Seizure disorder (HCC)   CAD (coronary artery disease)   Acute toxic-metabolic encephalopathy   (HFpEF) heart failure with preserved ejection fraction (HCC)   AMS (altered mental status)   Chronic anticoagulation   Chronic kidney disease, stage 3a (HCC)   Lactic acidosis   Hypoxic   Chronic prescription opiate use   Elevated troponin   Acute encephalopathy   Altered mental status: Thought to be due to metabolic encephalopathy from hypoglycemia.  Patient is still altered and is currently oriented to place only. He was admitted about a month ago for similar reason, work-up at  that time was negative and he had improvement. He was hypoglycemic on presentation.  Currently blood sugars are normal.  CT head did not show any acute intake abnormalities.  No signs of UTI.  UDS negative.  Home gabapentin dose decreased. Monitor mental status.  He most likely has underlying dementia.  As per his wife, he has been confused on and off and more frequently since last few months.  Continue delirium precautions.  Continue supportive care  A. fib with RVR/chronic A. fib: Cardiology was following.  Hospital course remarkable for A. fib with RVR.  Now rate is controlled.Currently on diltiazem, metoprolol, Xarelto.  Continue current medications.  Monitor on telemetry.  Elevated troponin: History of nonobstructive CAD.  Troponins are chronically elevated, flat trend.  Cardiology did not recommend any ischemic work-up.  Acute hypoxic respiratory failure: Resolved.  Currently on room air  Chronic diastolic CH failure: Currently euvolemic.  Continue current medications  Seizure disorder: On Depakote which we will continue.  Also on gabapentin at home, continue at reduced dose.  Seizure precautions  CKD stage IIIb: Stable  Diabetes type 2: Hypoglycemic on presentation.  Resolved.  Continue current insulin regimen.  Monitor blood sugars  Hypertension: Currently blood stable.  Continue current medications  Peripheral artery disease: Status post bilateral BKA.  Continue atorvastatin, Xarelto  OSA: Not on CPAP  Disposition: PT recommended SN facility.  Pending disposition, difficult placement due to insurance issues.We will follow up on Monday.    Nutrition Problem: Inadequate oral intake Etiology: acute illness      DVT prophylaxis:Xarelto Code Status: DNR Family Communication: Discussed with wife on phone on 08/02/2020 Status is: Inpatient  Remains inpatient appropriate because:Unsafe d/c plan  Dispo: The patient is from: Home              Anticipated d/c is to: SNF               Patient currently is medically stable to d/c.   Difficult to place patient Yes    Consultants: Cardiology  Procedures:None  Antimicrobials:  Anti-infectives (From admission, onward)   None      Subjective: Patient seen and examined at the bedside this morning.  Looks comfortable, not in any kind of distress.  Confused, but oriented to place.  Communicates well  Objective: Vitals:   08/02/20 1845 08/02/20 2057 08/03/20 0032 08/03/20 0622  BP:  (!) 123/54 121/75 (!) 174/92  Pulse:  (!) 43 (!) 58 64  Resp: 20 16  16   Temp:  98.1 F (36.7 C) 98.2 F (36.8 C) (!) 96.8 F (36 C)  TempSrc:  Oral Oral Axillary  SpO2:  95% 94% 95%  Weight:      Height:        Intake/Output Summary (Last 24 hours) at 08/03/2020 0725 Last data filed at 08/03/2020 0655 Gross per 24 hour  Intake 360 ml  Output 1600 ml  Net -1240 ml   Filed Weights   07/31/20 0451 08/01/20 0045 08/02/20 0003  Weight: 88.5 kg 88 kg 88.4 kg    Examination:   General exam: Comfortable, deconditioned, debilitated Respiratory system: Bilateral equal air entry, normal vesicular breath sounds, no wheezes or crackles  Cardiovascular system: Irregularly irregular rhythm. No JVD, murmurs, rubs, gallops or clicks. Gastrointestinal system: Abdomen is nondistended, soft and nontender. No organomegaly or masses felt. Normal bowel sounds heard. Central nervous system: Alert and oriented to place only  Extremities: B/L BKA Skin: No rashes, lesions or ulcers,no icterus ,no pallor    Data Reviewed: I have personally reviewed following labs and imaging studies  CBC: Recent Labs  Lab 07/28/20 1255 07/29/20 1009 07/30/20 0523 07/31/20 0533 08/01/20 0658 08/02/20 0534  WBC 7.4 8.7 9.6 7.8 8.0 7.9  NEUTROABS 4.1  --   --   --   --   --   HGB 13.3 14.0 14.4 15.2 14.0 13.3  HCT 39.5 42.5 43.7 44.9 42.6 38.9*  MCV 89.8 90.8 89.7 89.1 90.6 88.8  PLT 211 224 231 202 226 419   Basic Metabolic Panel: Recent  Labs  Lab 07/28/20 1255 07/29/20 1009 07/30/20 0523 07/31/20 0533 08/01/20 0658 08/02/20 0534  NA 142 139 141 139 140 139  K 4.2 4.7 4.2 4.5 5.1 4.7  CL 109 102 105 106 105 106  CO2 25 27 26 26 26 25   GLUCOSE 79 236* 97 105* 116* 141*  BUN 40* 40* 44* 46* 53* 47*  CREATININE 1.62* 1.62* 1.45* 1.44* 1.61* 1.55*  CALCIUM 8.5* 8.7* 8.7* 8.6* 8.8* 8.7*  MG 2.2  --   --  2.1  --   --   PHOS  --   --   --  3.7  --   --    GFR: Estimated Creatinine Clearance: 38.3 mL/min (A) (by C-G formula based on SCr of 1.55 mg/dL (H)). Liver Function Tests: Recent Labs  Lab 07/29/20 1009 07/30/20 0523 07/31/20 0533 08/01/20 0658 08/02/20 0534  AST 40 32 26 28 25   ALT 28 24 22 24 20   ALKPHOS 96 93 85 87 92  BILITOT 0.8 0.6 0.9 0.9 0.8  PROT 6.6 7.0 6.7 7.1 6.5  ALBUMIN 3.2* 3.3* 3.1* 3.5 3.2*   No results for  input(s): LIPASE, AMYLASE in the last 168 hours. No results for input(s): AMMONIA in the last 168 hours. Coagulation Profile: No results for input(s): INR, PROTIME in the last 168 hours. Cardiac Enzymes: No results for input(s): CKTOTAL, CKMB, CKMBINDEX, TROPONINI in the last 168 hours. BNP (last 3 results) No results for input(s): PROBNP in the last 8760 hours. HbA1C: No results for input(s): HGBA1C in the last 72 hours. CBG: Recent Labs  Lab 08/01/20 2053 08/02/20 0802 08/02/20 1122 08/02/20 1544 08/02/20 1954  GLUCAP 173* 129* 208* 135* 157*   Lipid Profile: No results for input(s): CHOL, HDL, LDLCALC, TRIG, CHOLHDL, LDLDIRECT in the last 72 hours. Thyroid Function Tests: No results for input(s): TSH, T4TOTAL, FREET4, T3FREE, THYROIDAB in the last 72 hours. Anemia Panel: No results for input(s): VITAMINB12, FOLATE, FERRITIN, TIBC, IRON, RETICCTPCT in the last 72 hours. Sepsis Labs: Recent Labs  Lab 07/28/20 1255 07/28/20 1500 07/29/20 0939  PROCALCITON 0.10  --   --   LATICACIDVEN 2.2* 2.6* 2.6*    Recent Results (from the past 240 hour(s))  Urine Culture      Status: None   Collection Time: 07/28/20 12:55 PM   Specimen: Urine, Random  Result Value Ref Range Status   Specimen Description   Final    URINE, RANDOM Performed at [facility], [address], [place], [place] [address]    Special Requests   Final    NONE Performed at [facility], [address], [place], [place] [address]    Culture   Final    NO GROWTH Performed at [facility], [place] [address], [place], [place] [address]    Report Status 07/30/2020 FINAL  Final  SARS CORONAVIRUS 2 (TAT 6-24 HRS) Nasopharyngeal Nasopharyngeal Swab     Status: None   Collection Time: 07/28/20  1:57 PM   Specimen: Nasopharyngeal Swab  Result Value Ref Range Status   SARS Coronavirus 2 NEGATIVE NEGATIVE Final    Comment: (NOTE) SARS-CoV-2 target nucleic acids are NOT DETECTED.  The SARS-CoV-2 RNA is generally detectable in upper and lower respiratory specimens during the acute phase of infection. Negative results do not preclude SARS-CoV-2 infection, do not rule out co-infections with other pathogens, and should not be used as the sole basis for treatment or other patient management decisions. Negative results must be combined with clinical observations, patient history, and epidemiological information. The expected result is Negative.  Fact Sheet for Patients: SugarRoll.be  Fact Sheet for Healthcare Providers: https://www.woods-mathews.com/  This test is not yet approved or cleared by the [place] FDA and  has been authorized for detection and/or diagnosis of SARS-CoV-2 by FDA under an Emergency Use Authorization (EUA). This EUA will remain  in effect (meaning this test can be used) for the duration of the COVID-19 declaration under Se ction 564(b)(1) of the Act, 21 U.S.C. section 360bbb-3(b)(1), unless the authorization is terminated or revoked sooner.  Performed at [facility], [place] [address]  [place], [place] [address]   Blood culture (routine x 2)     Status: Abnormal   Collection Time: 07/28/20  7:56 PM   Specimen: BLOOD  Result Value Ref Range Status   Specimen Description   Final    BLOOD LEFT AC Performed at [facility], [address] [place], [place] [address]    Special Requests   Final    BOTTLES DRAWN AEROBIC AND ANAEROBIC Blood Culture results may not be optimal due to an inadequate volume of  blood received in culture bottles Performed at [facility], [address] [place], [place] [address]    Culture  Setup Time   Final    GRAM POSITIVE RODS ANAEROBIC BOTTLE ONLY CRITICAL RESULT CALLED TO, READ BACK BY AND VERIFIED WITH: NATHAN BELUE AT 7867 07/29/20 SDR Organism ID to follow [place] CRITICAL RESULT CALLED TO, READ BACK BY AND VERIFIED WITH: SUSAN WATSON ON 07/29/20 AT 1200 QSD Performed at [facility], [address] [place], [place] [address]    Culture (A)  Final    STAPHYLOCOCCUS HOMINIS BACILLUS SPECIES THE SIGNIFICANCE OF ISOLATING THIS ORGANISM FROM A SINGLE SET OF BLOOD CULTURES WHEN MULTIPLE SETS ARE DRAWN IS UNCERTAIN. PLEASE NOTIFY THE MICROBIOLOGY DEPARTMENT WITHIN ONE WEEK IF SPECIATION AND SENSITIVITIES ARE REQUIRED. Performed at [facility], [place] [address], [place], [address] [address]    Report Status 08/01/2020 FINAL  Final  Blood culture (routine x 2)     Status: None (Preliminary result)   Collection Time: 07/28/20  7:56 PM   Specimen: BLOOD  Result Value Ref Range Status   Specimen Description   Final    BLOOD RIGHT HAND Performed at [facility], [address], [place], [place] [address]    Special Requests   Final    BOTTLES DRAWN AEROBIC AND ANAEROBIC Blood Culture results may not be optimal due to an inadequate volume of blood received in culture bottles Performed at [facility], [address] [place], [place] [address]    Culture  Setup  Time   Final    AEROBIC BOTTLE ONLY GRAM POSITIVE RODS GRAM POSITIVE COCCI CRITICAL VALUE NOTED.  VALUE IS CONSISTENT WITH PREVIOUSLY REPORTED AND CALLED VALUE. Performed at [facility], [address], [place], [place] [address]    Culture   Final    CULTURE REINCUBATED FOR BETTER GROWTH Performed at [facility], [place] [address] [place], [place] [address]    Report Status PENDING  Incomplete  Blood Culture ID Panel (Reflexed)     Status: Abnormal   Collection Time: 07/28/20  7:56 PM  Result Value Ref Range Status   Enterococcus faecalis NOT DETECTED NOT DETECTED Final   Enterococcus Faecium NOT DETECTED NOT DETECTED Final   Listeria monocytogenes NOT DETECTED NOT DETECTED Final   Staphylococcus species DETECTED (A) NOT DETECTED Final    Comment: CRITICAL RESULT CALLED TO, READ BACK BY AND VERIFIED WITH: SUSAN WATSON ON 07/29/20 AT 1200 QSD    Staphylococcus aureus (BCID) NOT DETECTED NOT DETECTED Final   Staphylococcus epidermidis DETECTED (A) NOT DETECTED Final    Comment: Methicillin (oxacillin) resistant coagulase negative staphylococcus. Possible blood culture contaminant (unless isolated from more than one blood culture draw or clinical case suggests pathogenicity). No antibiotic treatment is indicated for blood  culture contaminants. CRITICAL RESULT CALLED TO, READ BACK BY AND VERIFIED WITH: SUSAN WATSON ON 07/29/20 AT 1200 QSD    Staphylococcus lugdunensis NOT DETECTED NOT DETECTED Final   Streptococcus species NOT DETECTED NOT DETECTED Final   Streptococcus agalactiae NOT DETECTED NOT DETECTED Final   Streptococcus pneumoniae NOT DETECTED NOT DETECTED Final   Streptococcus pyogenes NOT DETECTED NOT DETECTED Final   A.calcoaceticus-baumannii NOT DETECTED NOT DETECTED Final   Bacteroides fragilis NOT DETECTED NOT DETECTED Final   Enterobacterales NOT DETECTED NOT DETECTED Final   Enterobacter cloacae complex NOT DETECTED NOT DETECTED Final   Escherichia coli  NOT DETECTED NOT DETECTED Final   Klebsiella  aerogenes NOT DETECTED NOT DETECTED Final   Klebsiella oxytoca NOT DETECTED NOT DETECTED Final   Klebsiella pneumoniae NOT DETECTED NOT DETECTED Final   Proteus species NOT DETECTED NOT DETECTED Final   Salmonella species NOT DETECTED NOT DETECTED Final   Serratia marcescens NOT DETECTED NOT DETECTED Final   Haemophilus influenzae NOT DETECTED NOT DETECTED Final   Neisseria meningitidis NOT DETECTED NOT DETECTED Final   Pseudomonas aeruginosa NOT DETECTED NOT DETECTED Final   Stenotrophomonas maltophilia NOT DETECTED NOT DETECTED Final   Candida albicans NOT DETECTED NOT DETECTED Final   Candida auris NOT DETECTED NOT DETECTED Final   Candida glabrata NOT DETECTED NOT DETECTED Final   Candida krusei NOT DETECTED NOT DETECTED Final   Candida parapsilosis NOT DETECTED NOT DETECTED Final   Candida tropicalis NOT DETECTED NOT DETECTED Final   Cryptococcus neoformans/gattii NOT DETECTED NOT DETECTED Final   Methicillin resistance mecA/C DETECTED (A) NOT DETECTED Final    Comment: CRITICAL RESULT CALLED TO, READ BACK BY AND VERIFIED WITH: SUSAN WATSON ON 07/29/20 AT 1200 QSD Performed at [facility], [place], [place] [address]   CULTURE, BLOOD (ROUTINE X 2) w Reflex to ID Panel     Status: None (Preliminary result)   Collection Time: 07/31/20  1:09 PM   Specimen: BLOOD  Result Value Ref Range Status   Specimen Description BLOOD BLOOD LEFT HAND  Final   Special Requests   Final    BOTTLES DRAWN AEROBIC AND ANAEROBIC Blood Culture adequate volume   Culture   Final    NO GROWTH 2 DAYS Performed at [facility], [place] [place], [place] [address]    Report Status PENDING  Incomplete  CULTURE, BLOOD (ROUTINE X 2) w Reflex to ID Panel     Status: None (Preliminary result)   Collection Time: 07/31/20  1:18 PM   Specimen: BLOOD  Result Value Ref Range Status   Specimen Description BLOOD RIGHT ANTECUBITAL   Final   Special Requests   Final    BOTTLES DRAWN AEROBIC ONLY Blood Culture results may not be optimal due to an inadequate volume of blood received in culture bottles   Culture   Final    NO GROWTH 2 DAYS Performed at [facility], [address] [place], [place] [address]    Report Status PENDING  Incomplete         Radiology Studies: ECHOCARDIOGRAM COMPLETE  Result Date: 08/01/2020    ECHOCARDIOGRAM REPORT   Patient Name:   [name] Date of Exam: 08/01/2020 Medical Rec #:  [id number]             Height:       67.0 in Accession #:    [id number]            Weight:       193.9 lb Date of Birth:  1935/04/24            BSA:          1.996 m Patient Age:    53 years              BP:           150/66 mmHg Patient Gender: M                     HR:           66 bpm. Exam Location:  [facility] Procedure: 2D Echo, Cardiac Doppler, Color Doppler and  Intracardiac            Opacification Agent Indications:     R07.9 Chest Pain  History:         Patient has prior history of Echocardiogram examinations, most                  recent 11/30/2019. CHF, CKD; Risk Factors:Hypertension, Diabetes                  and Dyslipidemia. Hx of COVID-19 in 01/2019.  Sonographer:     Charmayne Sheer RDCS (AE) Referring Phys:  [id number] Val Riles Diagnosing Phys: Bartholome Bill MD  Sonographer Comments: Technically difficult study due to poor echo windows. Image acquisition challenging due to patient body habitus and Image acquisition challenging due to uncooperative patient. IMPRESSIONS  1. Left ventricular ejection fraction, by estimation, is 45 to 50%. The left ventricle has mildly decreased function. The left ventricle has no regional wall motion abnormalities. Left ventricular diastolic parameters are consistent with Grade I diastolic dysfunction (impaired relaxation).  2. Right ventricular systolic function is normal. The right ventricular size is normal.  3. Right atrial size was mildly dilated.  4. The mitral  valve was not well visualized. Trivial mitral valve regurgitation.  5. The aortic valve was not well visualized. Aortic valve regurgitation is trivial.  6. Aortic dilatation noted. There is borderline dilatation of the aortic root, measuring 36 mm. FINDINGS  Left Ventricle: Left ventricular ejection fraction, by estimation, is 45 to 50%. The left ventricle has mildly decreased function. The left ventricle has no regional wall motion abnormalities. Definity contrast agent was given IV to delineate the left ventricular endocardial borders. The left ventricular internal cavity size was normal in size. There is no left ventricular hypertrophy. Left ventricular diastolic parameters are consistent with Grade I diastolic dysfunction (impaired relaxation). Right Ventricle: The right ventricular size is normal. No increase in right ventricular wall thickness. Right ventricular systolic function is normal. Left Atrium: Left atrial size was normal in size. Right Atrium: Right atrial size was mildly dilated. Pericardium: There is no evidence of pericardial effusion. Mitral Valve: The mitral valve was not well visualized. Trivial mitral valve regurgitation. MV peak gradient, 3.2 mmHg. The mean mitral valve gradient is 2.0 mmHg. Tricuspid Valve: The tricuspid valve is not well visualized. Tricuspid valve regurgitation is mild. Aortic Valve: The aortic valve was not well visualized. Aortic valve regurgitation is trivial. Aortic valve mean gradient measures 4.0 mmHg. Aortic valve peak gradient measures 7.0 mmHg. Aortic valve area, by VTI measures 3.07 cm. Pulmonic Valve: The pulmonic valve was not well visualized. Pulmonic valve regurgitation is trivial. Aorta: Aortic dilatation noted. There is borderline dilatation of the aortic root, measuring 36 mm. IAS/Shunts: The interatrial septum was not well visualized.  LEFT VENTRICLE PLAX 2D LVIDd:         6.30 cm      Diastology LVIDs:         4.95 cm      LV e' medial:    3.81 cm/s LV  PW:         1.00 cm      LV E/e' medial:  15.9 LV IVS:        0.90 cm      LV e' lateral:   3.48 cm/s LVOT diam:     2.40 cm      LV E/e' lateral: 17.4 LV SV:         82 LV SV Index:  41 LVOT Area:     4.52 cm  LV Volumes (MOD) LV vol d, MOD A2C: 107.0 ml LV vol d, MOD A4C: 105.0 ml LV vol s, MOD A2C: 68.3 ml LV vol s, MOD A4C: 67.3 ml LV SV MOD A2C:     38.7 ml LV SV MOD A4C:     105.0 ml LV SV MOD BP:      46.2 ml RIGHT VENTRICLE RV Basal diam:  3.40 cm LEFT ATRIUM             Index       RIGHT ATRIUM           Index LA diam:        5.70 cm 2.86 cm/m  RA Area:     15.00 cm LA Vol (A2C):   70.1 ml 35.12 ml/m RA Volume:   32.70 ml  16.38 ml/m LA Vol (A4C):   67.6 ml 33.87 ml/m LA Biplane Vol: 69.9 ml 35.02 ml/m  AORTIC VALVE AV Area (Vmax):    2.73 cm AV Area (Vmean):   2.76 cm AV Area (VTI):     3.07 cm AV Vmax:           132.00 cm/s AV Vmean:          89.700 cm/s AV VTI:            0.267 m AV Peak Grad:      7.0 mmHg AV Mean Grad:      4.0 mmHg LVOT Vmax:         79.60 cm/s LVOT Vmean:        54.700 cm/s LVOT VTI:          0.181 m LVOT/AV VTI ratio: 0.68  AORTA Ao Root diam: 3.60 cm MITRAL VALVE MV Area (PHT): 6.17 cm    SHUNTS MV Area VTI:   3.91 cm    Systemic VTI:  0.18 m MV Peak grad:  3.2 mmHg    Systemic Diam: 2.40 cm MV Mean grad:  2.0 mmHg MV Vmax:       0.89 m/s MV Vmean:      58.3 cm/s MV Decel Time: 123 msec MV E velocity: 60.40 cm/s MV A velocity: 95.55 cm/s MV E/A ratio:  0.63 Bartholome Bill MD Electronically signed by Bartholome Bill MD Signature Date/Time: 08/01/2020/1:09:25 PM    Final         Scheduled Meds: . atorvastatin  80 mg Oral QHS  . chlorthalidone  25 mg Oral Daily  . diltiazem  240 mg Oral Daily  . divalproex  500 mg Oral Daily  . ferrous sulfate  325 mg Oral Q breakfast  . gabapentin  100 mg Oral TID  . insulin aspart  0-15 Units Subcutaneous TID WC  . insulin aspart  0-5 Units Subcutaneous QHS  . insulin glargine  15 Units Subcutaneous QHS  . isosorbide  mononitrate  60 mg Oral Daily  . metoprolol succinate  25 mg Oral Q breakfast  . multivitamin with minerals  1 tablet Oral Daily  . pantoprazole  40 mg Oral QHS  . Ensure Max Protein  11 oz Oral BID  . Rivaroxaban  15 mg Oral Q supper   Continuous Infusions:   LOS: 5 days    Time spent: 35 mins.More than 50% of that time was spent in counseling and/or coordination of care.      Shelly Coss, MD [facility] P3/13/2022, 7:25 AM

## 2020-08-04 DIAGNOSIS — G9340 Encephalopathy, unspecified: Secondary | ICD-10-CM | POA: Diagnosis not present

## 2020-08-04 LAB — RESP PANEL BY RT-PCR (FLU A&B, COVID) ARPGX2
Influenza A by PCR: NEGATIVE
Influenza B by PCR: NEGATIVE
SARS Coronavirus 2 by RT PCR: NEGATIVE

## 2020-08-04 LAB — GLUCOSE, CAPILLARY
Glucose-Capillary: 307 mg/dL — ABNORMAL HIGH (ref 70–99)
Glucose-Capillary: 317 mg/dL — ABNORMAL HIGH (ref 70–99)
Glucose-Capillary: 160 mg/dL — ABNORMAL HIGH (ref 70–99)

## 2020-08-04 MED ORDER — BASAGLAR KWIKPEN 100 UNIT/ML ~~LOC~~ SOPN: 20.0000 [IU] | PEN_INJECTOR | Freq: Every day | SUBCUTANEOUS | Status: DC

## 2020-08-04 MED ORDER — GABAPENTIN 100 MG PO CAPS: 100.0000 mg | ORAL_CAPSULE | Freq: Three times a day (TID) | ORAL | Status: DC

## 2020-08-04 MED ORDER — PANTOPRAZOLE SODIUM 40 MG PO TBEC: 40.0000 mg | DELAYED_RELEASE_TABLET | Freq: Every day | ORAL | 0 refills | Status: DC

## 2020-08-04 MED ORDER — DILTIAZEM HCL ER COATED BEADS 240 MG PO CP24: 240.0000 mg | ORAL_CAPSULE | Freq: Every day | ORAL | Status: DC

## 2020-08-04 NOTE — TOC Progression Note (Signed)
Transition of Care [facility]) - Progression Note    Patient Details  Name: [name] MRN: [id number] Date of Birth: Jun 15, 1934  Transition of Care [facility]) CM/SW Contact  Shelbie Hutching, RN Phone Number: 08/02/2020, 12:54 PM  Clinical Narrative:    [name] is requesting updated clinicals.  Clinicals uploaded to the portal.      Barriers to Discharge: Insurance Authorization  Expected Discharge Plan and Services                                                 Social Determinants of Health (SDOH) Interventions    Readmission Risk Interventions Readmission Risk Prevention Plan 03/07/2020 02/12/2019 12/25/2018  Transportation Screening Complete - Complete  PCP or Specialist Appt within 3-5 Days Complete - Complete  HRI or Home Care Consult Complete - Complete  Social Work Consult for [name] Complete - Complete  Palliative Care Screening Not Applicable - Not Applicable  Medication Review [employer]) Complete - Complete  PCP or Specialist appointment within 3-5 days of discharge - Not Complete -  PCP/Specialist Appt Not Complete comments - not ready for dc, family requests SNF placement -  HRI or Home Care Consult - Complete -  SW Recovery Care/Counseling Consult - Complete -  Palliative Care Screening - Not Applicable -  [name] - Complete -  Some recent data might be hidden

## 2020-08-04 NOTE — Discharge Summary (Signed)
Physician Discharge Summary  [name] ZOX:[id number] DOB: [birth year] DOA: 07/28/2020  PCP: [name] date: 07/28/2020 Discharge date: 07/27/2020  Admitted From: Home Disposition:  Home  Discharge Condition:Stable CODE STATUS:DNR Diet recommendation: Heart Healthy  Brief/Interim Summary:  [name] a 86 y.o.malewith medical history significant forDM, HTN, PAD status post bilateral BKA, CAD, diastolic heart failure, CKD stage IIIa, paroxysmal A. fib on Xarelto, seizure disorder, history of UTIs, chronic pain on chronic opiates, hospitalized from 2/2-2/3 with altered mental status believed related to unintentional opiate overdose with suspected underlying dementia, and recently seen in the ER on 2/22 with dysuria with urine culture showing less than 10,000 colonies, who was brought to the emergency room with altered mental status, initially showing response to Narcan but with fluctuating mental status after several hours of observation in the emergency room. His altered mental status was thought to be secondary to hypoglycemia. Hospital course remarkable for afib with RVR, cardiology was following. Currently hemodynamically stable. Mental status has improved,now close to baseline.Plan for skilled facility placement,SW following, patient is medically stable for discharge .  Following problems were addressed during his hospitalization:  Altered mental status:Thought to be due to metabolic encephalopathy from hypoglycemia. Patient is still altered and is currently oriented to place only. He was admitted about a month ago for similar reason, work-up at that time was negative and he had improvement. He was hypoglycemic on presentation. Currently blood sugars are normal. CT head did not show any acute intake abnormalities. No signs of UTI. UDS negative. Home gabapentin dose decreased. Monitor mental status. He most likely has underlying dementia.  As per his wife, he has been confused on and off and more frequently since last few months. Continue delirium precautions. Continue supportive care  A. fib with RVR/chronic A. fib: Cardiology was following. Hospital course remarkable for A. fib with RVR. Now rate is controlled.Currently on diltiazem, metoprolol, Xarelto. Continue current medications. Monitor on telemetry.  Elevated troponin: History of nonobstructive CAD. Troponins are chronically elevated, flat trend. Cardiology did not recommend any ischemic work-up.  Acute hypoxic respiratory failure: Resolved. Currently on room air  Chronic diastolic CH failure: Currently euvolemic. Continue current medications  Seizure disorder: On Depakote which we will continue. Also on gabapentin at home, continue at reduced dose. Seizure precautions  CKD stage IIIb:Stable  Diabetes type 2: Hypoglycemic on presentation. Resolved. Continue current insulin regimen. Monitor blood sugars  Hypertension: Currently blood stable. Continue current medications  Peripheral artery disease: Status post bilateral BKA. Continue atorvastatin, Xarelto  OSA: Not on CPAP  Disposition: PT recommended SN facility.   Discharge Diagnoses:  Active Problems:   S/P bilateral BKA (below knee amputation) (HCC)   Paroxysmal A-fib (HCC)   Seizure disorder (HCC)   CAD (coronary artery disease)   Acute toxic-metabolic encephalopathy   (HFpEF) heart failure with preserved ejection fraction (HCC)   AMS (altered mental status)   Chronic anticoagulation   Chronic kidney disease, stage 3a (HCC)   Lactic acidosis   Hypoxic   Chronic prescription opiate use   Elevated troponin   Acute encephalopathy    Discharge Instructions  Discharge Instructions    Diet - low sodium heart healthy   Complete by: As directed    Discharge instructions   Complete by: As directed    1)Please take prescribed medications as instructed 2)Do a CBC and BMP  tests in a week 3)We recommend close monitoring of glucose  and adjustment of insulin as needed.   Increase  activity slowly   Complete by: As directed      Allergies as of 08/10/2020      Reactions   Contrast Media [iodinated Diagnostic Agents] Shortness Of Breath   SOB   Iohexol Shortness Of Breath    Desc: Respiratory Distress, Laryngedema, diaphoresis, Onset Date: [id number]   Metrizamide Shortness Of Breath   SOB SOB SOB   Latex Rash      Medication List    STOP taking these medications   diltiazem 120 MG 24 hr capsule Commonly known as: DILACOR XR Replaced by: diltiazem 240 MG 24 hr capsule   Klor-Con M20 20 MEQ tablet Generic drug: potassium chloride SA   Tiadylt ER 120 MG 24 hr capsule Generic drug: diltiazem     TAKE these medications   atorvastatin 80 MG tablet Commonly known as: LIPITOR Take 80 mg by mouth at bedtime.   Basaglar KwikPen 100 UNIT/ML Inject 20 Units into the skin at bedtime. What changed: how much to take   bisacodyl 5 MG EC tablet Commonly known as: DULCOLAX Take 1 tablet (5 mg total) by mouth daily as needed for moderate constipation.   chlorthalidone 25 MG tablet Commonly known as: HYGROTON Take 25 mg by mouth daily.   diltiazem 240 MG 24 hr capsule Commonly known as: CARDIZEM CD Take 1 capsule (240 mg total) by mouth daily. Start taking on: August 05, 2020 Replaces: diltiazem 120 MG 24 hr capsule   divalproex 500 MG DR tablet Commonly known as: DEPAKOTE Take 500 mg by mouth daily.   Ensure Max Protein Liqd Take 330 mLs (11 oz total) by mouth 2 (two) times daily.   ferrous sulfate 325 (65 FE) MG tablet Take 325 mg by mouth daily with breakfast.   gabapentin 100 MG capsule Commonly known as: NEURONTIN Take 1 capsule (100 mg total) by mouth 3 (three) times daily. What changed:   medication strength  how much to take   isosorbide mononitrate 30 MG 24 hr tablet Commonly known as: IMDUR Take 60 mg by mouth daily.    metFORMIN 500 MG tablet Commonly known as: GLUCOPHAGE Take 500 mg by mouth 2 (two) times daily with a meal.   metoprolol succinate 25 MG 24 hr tablet Commonly known as: TOPROL-XL Take 1 tablet (25 mg total) by mouth daily. Take with or immediately following a meal.   NovoLOG FlexPen 100 UNIT/ML FlexPen Generic drug: insulin aspart Inject 4 Units into the skin 3 (three) times daily with meals.   pantoprazole 40 MG tablet Commonly known as: PROTONIX Take 1 tablet (40 mg total) by mouth at bedtime for 14 days.   Rivaroxaban 15 MG Tabs tablet Commonly known as: XARELTO Take 1 tablet (15 mg total) by mouth daily with supper. Hold Xarelto for 3 days.  Start on January 09, 2019.       Contact information for after-discharge care    [facility] .   Service: Skilled Nursing Contact information: [place] [phone]                 Allergies  Allergen Reactions  . Contrast Media [Iodinated Diagnostic Agents] Shortness Of Breath    SOB  . Iohexol Shortness Of Breath     Desc: Respiratory Distress, Laryngedema, diaphoresis, Onset Date: [id number]   . Metrizamide Shortness Of Breath    SOB SOB SOB   . Latex Rash  Consultations:  None   Procedures/Studies: CT Head Wo Contrast  Result Date: 07/28/2020 CLINICAL DATA:  Mental status changes. EXAM: CT HEAD WITHOUT CONTRAST TECHNIQUE: Contiguous axial images were obtained from the base of the skull through the vertex without intravenous contrast. COMPARISON:  MRI 06/26/2020.  CT scan 06/25/2020. FINDINGS: Brain: There is no evidence for acute hemorrhage, hydrocephalus, mass lesion, or abnormal extra-axial fluid collection. No definite CT evidence for acute infarction. Diffuse loss of parenchymal volume is consistent with atrophy. Patchy low attenuation in the deep hemispheric and  periventricular white matter is nonspecific, but likely reflects chronic microvascular ischemic demyelination. Vascular: No hyperdense vessel or unexpected calcification. Skull: No evidence for fracture. No worrisome lytic or sclerotic lesion. Sinuses/Orbits: The visualized paranasal sinuses and mastoid air cells are clear. Visualized portions of the globes and intraorbital fat are unremarkable. Other: None. IMPRESSION: 1. No acute intracranial abnormality. 2. Atrophy with chronic small vessel white matter ischemic disease. Electronically Signed   By: Misty Stanley M.D.   On: 07/28/2020 13:35   DG Chest Portable 1 View  Result Date: 07/28/2020 CLINICAL DATA:  86 year old with acute mental status changes and open coil bore responsiveness". Current history of CHF, hypertension. EXAM: PORTABLE CHEST 1 VIEW COMPARISON:  06/25/2020 and earlier, including CT chest 11/30/2019. FINDINGS: Cardiac silhouette moderately to markedly enlarged for AP portable technique, unchanged. Pulmonary venous hypertension and likely mild interstitial pulmonary edema. No confluent airspace consolidation. No visible pleural effusions. IMPRESSION: Mild CHF and/or fluid overload is suspected, with stable moderate to marked cardiomegaly. Electronically Signed   By: Evangeline Dakin M.D.   On: 07/28/2020 17:00   ECHOCARDIOGRAM COMPLETE  Result Date: 08/01/2020    ECHOCARDIOGRAM REPORT   Patient Name:   CROIX PRESLEY Date of Exam: 08/01/2020 Medical Rec #:  [id number]             Height:       67.0 in Accession #:    [id number]            Weight:       193.9 lb Date of Birth:  1934/10/17            BSA:          1.996 m Patient Age:    50 years              BP:           150/66 mmHg Patient Gender: M                     HR:           66 bpm. Exam Location:  [facility] Procedure: 2D Echo, Cardiac Doppler, Color Doppler and Intracardiac            Opacification Agent Indications:     R07.9 Chest Pain  History:         Patient has prior  history of Echocardiogram examinations, most                  recent 11/30/2019. CHF, CKD; Risk Factors:Hypertension, Diabetes                  and Dyslipidemia. Hx of COVID-19 in 01/2019.  Sonographer:     Charmayne Sheer RDCS (AE) Referring Phys:  [id number] Val Riles Diagnosing Phys: Bartholome Bill MD  Sonographer Comments: Technically difficult study due to poor echo windows. Image acquisition challenging due to patient body habitus and Image acquisition challenging due to uncooperative  patient. IMPRESSIONS  1. Left ventricular ejection fraction, by estimation, is 45 to 50%. The left ventricle has mildly decreased function. The left ventricle has no regional wall motion abnormalities. Left ventricular diastolic parameters are consistent with Grade I diastolic dysfunction (impaired relaxation).  2. Right ventricular systolic function is normal. The right ventricular size is normal.  3. Right atrial size was mildly dilated.  4. The mitral valve was not well visualized. Trivial mitral valve regurgitation.  5. The aortic valve was not well visualized. Aortic valve regurgitation is trivial.  6. Aortic dilatation noted. There is borderline dilatation of the aortic root, measuring 36 mm. FINDINGS  Left Ventricle: Left ventricular ejection fraction, by estimation, is 45 to 50%. The left ventricle has mildly decreased function. The left ventricle has no regional wall motion abnormalities. Definity contrast agent was given IV to delineate the left ventricular endocardial borders. The left ventricular internal cavity size was normal in size. There is no left ventricular hypertrophy. Left ventricular diastolic parameters are consistent with Grade I diastolic dysfunction (impaired relaxation). Right Ventricle: The right ventricular size is normal. No increase in right ventricular wall thickness. Right ventricular systolic function is normal. Left Atrium: Left atrial size was normal in size. Right Atrium: Right atrial size was mildly  dilated. Pericardium: There is no evidence of pericardial effusion. Mitral Valve: The mitral valve was not well visualized. Trivial mitral valve regurgitation. MV peak gradient, 3.2 mmHg. The mean mitral valve gradient is 2.0 mmHg. Tricuspid Valve: The tricuspid valve is not well visualized. Tricuspid valve regurgitation is mild. Aortic Valve: The aortic valve was not well visualized. Aortic valve regurgitation is trivial. Aortic valve mean gradient measures 4.0 mmHg. Aortic valve peak gradient measures 7.0 mmHg. Aortic valve area, by VTI measures 3.07 cm. Pulmonic Valve: The pulmonic valve was not well visualized. Pulmonic valve regurgitation is trivial. Aorta: Aortic dilatation noted. There is borderline dilatation of the aortic root, measuring 36 mm. IAS/Shunts: The interatrial septum was not well visualized.  LEFT VENTRICLE PLAX 2D LVIDd:         6.30 cm      Diastology LVIDs:         4.95 cm      LV e' medial:    3.81 cm/s LV PW:         1.00 cm      LV E/e' medial:  15.9 LV IVS:        0.90 cm      LV e' lateral:   3.48 cm/s LVOT diam:     2.40 cm      LV E/e' lateral: 17.4 LV SV:         82 LV SV Index:   41 LVOT Area:     4.52 cm  LV Volumes (MOD) LV vol d, MOD A2C: 107.0 ml LV vol d, MOD A4C: 105.0 ml LV vol s, MOD A2C: 68.3 ml LV vol s, MOD A4C: 67.3 ml LV SV MOD A2C:     38.7 ml LV SV MOD A4C:     105.0 ml LV SV MOD BP:      46.2 ml RIGHT VENTRICLE RV Basal diam:  3.40 cm LEFT ATRIUM             Index       RIGHT ATRIUM           Index LA diam:        5.70 cm 2.86 cm/m  RA Area:     15.00 cm  LA Vol (A2C):   70.1 ml 35.12 ml/m RA Volume:   32.70 ml  16.38 ml/m LA Vol (A4C):   67.6 ml 33.87 ml/m LA Biplane Vol: 69.9 ml 35.02 ml/m  AORTIC VALVE AV Area (Vmax):    2.73 cm AV Area (Vmean):   2.76 cm AV Area (VTI):     3.07 cm AV Vmax:           132.00 cm/s AV Vmean:          89.700 cm/s AV VTI:            0.267 m AV Peak Grad:      7.0 mmHg AV Mean Grad:      4.0 mmHg LVOT Vmax:         79.60 cm/s  LVOT Vmean:        54.700 cm/s LVOT VTI:          0.181 m LVOT/AV VTI ratio: 0.68  AORTA Ao Root diam: 3.60 cm MITRAL VALVE MV Area (PHT): 6.17 cm    SHUNTS MV Area VTI:   3.91 cm    Systemic VTI:  0.18 m MV Peak grad:  3.2 mmHg    Systemic Diam: 2.40 cm MV Mean grad:  2.0 mmHg MV Vmax:       0.89 m/s MV Vmean:      58.3 cm/s MV Decel Time: 123 msec MV E velocity: 60.40 cm/s MV A velocity: 95.55 cm/s MV E/A ratio:  0.63 Bartholome Bill MD Electronically signed by Bartholome Bill MD Signature Date/Time: 08/01/2020/1:09:25 PM    Final        Subjective: Patient seen and examined at the bedside this morning.  Hemodynamically stable for discharge  Discharge Exam: Vitals:   08/18/2020 0753 08/03/2020 1204  BP: (!) 159/64 117/77  Pulse: (!) 53 63  Resp: 14 15  Temp: 98.1 F (36.7 C) 98.1 F (36.7 C)  SpO2: 94% 92%   Vitals:   08/02/2020 0059 08/02/2020 0503 07/23/2020 0753 08/13/2020 1204  BP: 124/71 (!) 152/65 (!) 159/64 117/77  Pulse: (!) 52 (!) 56 (!) 53 63  Resp: 16 16 14 15   Temp: 98 F (36.7 C) 97.8 F (36.6 C) 98.1 F (36.7 C) 98.1 F (36.7 C)  TempSrc:  Oral  Oral  SpO2: 91% 92% 94% 92%  Weight:      Height:        General: Pt is alert, awake, not in acute distress Cardiovascular: RRR, S1/S2 +, no rubs, no gallops Respiratory: CTA bilaterally, no wheezing, no rhonchi Abdominal: Soft, NT, ND, bowel sounds + Extremities: Bilateral BKA    The results of significant diagnostics from this hospitalization (including imaging, microbiology, ancillary and laboratory) are listed below for reference.     Microbiology: Recent Results (from the past 240 hour(s))  Urine Culture     Status: None   Collection Time: 07/28/20 12:55 PM   Specimen: Urine, Random  Result Value Ref Range Status   Specimen Description   Final    URINE, RANDOM Performed at [facility], [address] [place], [place] [address]    Special Requests   Final    NONE Performed at [facility],  [address] [place], [place] [address]    Culture   Final    NO GROWTH Performed at [facility], [place] [address] [place], [place] [address]    Report Status 07/30/2020 FINAL  Final  SARS CORONAVIRUS 2 (TAT 6-24 HRS) Nasopharyngeal Nasopharyngeal  Swab     Status: None   Collection Time: 07/28/20  1:57 PM   Specimen: Nasopharyngeal Swab  Result Value Ref Range Status   SARS Coronavirus 2 NEGATIVE NEGATIVE Final    Comment: (NOTE) SARS-CoV-2 target nucleic acids are NOT DETECTED.  The SARS-CoV-2 RNA is generally detectable in upper and lower respiratory specimens during the acute phase of infection. Negative results do not preclude SARS-CoV-2 infection, do not rule out co-infections with other pathogens, and should not be used as the sole basis for treatment or other patient management decisions. Negative results must be combined with clinical observations, patient history, and epidemiological information. The expected result is Negative.  Fact Sheet for Patients: SugarRoll.be  Fact Sheet for Healthcare Providers: https://www.woods-mathews.com/  This test is not yet approved or cleared by the [place] FDA and  has been authorized for detection and/or diagnosis of SARS-CoV-2 by FDA under an Emergency Use Authorization (EUA). This EUA will remain  in effect (meaning this test can be used) for the duration of the COVID-19 declaration under Se ction 564(b)(1) of the Act, 21 U.S.C. section 360bbb-3(b)(1), unless the authorization is terminated or revoked sooner.  Performed at [facility], [place] [address], [place], [place] [address]   Blood culture (routine x 2)     Status: Abnormal   Collection Time: 07/28/20  7:56 PM   Specimen: BLOOD  Result Value Ref Range Status   Specimen Description   Final    BLOOD LEFT AC Performed at [facility], [address] [place], [place] [address]    Special Requests    Final    BOTTLES DRAWN AEROBIC AND ANAEROBIC Blood Culture results may not be optimal due to an inadequate volume of blood received in culture bottles Performed at [facility], [address] [place], [place] [address]    Culture  Setup Time   Final    GRAM POSITIVE RODS ANAEROBIC BOTTLE ONLY CRITICAL RESULT CALLED TO, READ BACK BY AND VERIFIED WITH: NATHAN BELUE AT 2878 07/29/20 SDR Organism ID to follow [place] CRITICAL RESULT CALLED TO, READ BACK BY AND VERIFIED WITH: SUSAN WATSON ON 07/29/20 AT 1200 QSD Performed at [facility], [address] [place], [place] [address]    Culture (A)  Final    STAPHYLOCOCCUS HOMINIS BACILLUS SPECIES THE SIGNIFICANCE OF ISOLATING THIS ORGANISM FROM A SINGLE SET OF BLOOD CULTURES WHEN MULTIPLE SETS ARE DRAWN IS UNCERTAIN. PLEASE NOTIFY THE MICROBIOLOGY DEPARTMENT WITHIN ONE WEEK IF SPECIATION AND SENSITIVITIES ARE REQUIRED. Performed at [facility], [place] [address] [place], [place] [address]    Report Status 08/01/2020 FINAL  Final  Blood culture (routine x 2)     Status: Abnormal (Preliminary result)   Collection Time: 07/28/20  7:56 PM   Specimen: BLOOD  Result Value Ref Range Status   Specimen Description   Final    BLOOD RIGHT HAND Performed at [facility], [address] [place], [place] [address]    Special Requests   Final    BOTTLES DRAWN AEROBIC AND ANAEROBIC Blood Culture results may not be optimal due to an inadequate volume of blood received in culture bottles Performed at [facility], [address], [place], [place] [address]    Culture  Setup Time   Final    AEROBIC BOTTLE ONLY GRAM POSITIVE RODS GRAM POSITIVE COCCI CRITICAL VALUE NOTED.  VALUE IS CONSISTENT WITH PREVIOUSLY REPORTED AND CALLED VALUE. Performed at  [address] [place], [place] [address]    Culture (A)  Final    Maryjean Morn ROSEA CULTURE REINCUBATED FOR  BETTER GROWTH Performed at [facility], [place] [address], [place], [place] [address]    Report Status PENDING  Incomplete  Blood Culture ID Panel (Reflexed)     Status: Abnormal   Collection Time: 07/28/20  7:56 PM  Result Value Ref Range Status   Enterococcus faecalis NOT DETECTED NOT DETECTED Final   Enterococcus Faecium NOT DETECTED NOT DETECTED Final   Listeria monocytogenes NOT DETECTED NOT DETECTED Final   Staphylococcus species DETECTED (A) NOT DETECTED Final    Comment: CRITICAL RESULT CALLED TO, READ BACK BY AND VERIFIED WITH: SUSAN WATSON ON 07/29/20 AT 1200 QSD    Staphylococcus aureus (BCID) NOT DETECTED NOT DETECTED Final   Staphylococcus epidermidis DETECTED (A) NOT DETECTED Final    Comment: Methicillin (oxacillin) resistant coagulase negative staphylococcus. Possible blood culture contaminant (unless isolated from more than one blood culture draw or clinical case suggests pathogenicity). No antibiotic treatment is indicated for blood  culture contaminants. CRITICAL RESULT CALLED TO, READ BACK BY AND VERIFIED WITH: SUSAN WATSON ON 07/29/20 AT 1200 QSD    Staphylococcus lugdunensis NOT DETECTED NOT DETECTED Final   Streptococcus species NOT DETECTED NOT DETECTED Final   Streptococcus agalactiae NOT DETECTED NOT DETECTED Final   Streptococcus pneumoniae NOT DETECTED NOT DETECTED Final   Streptococcus pyogenes NOT DETECTED NOT DETECTED Final   A.calcoaceticus-baumannii NOT DETECTED NOT DETECTED Final   Bacteroides fragilis NOT DETECTED NOT DETECTED Final   Enterobacterales NOT DETECTED NOT DETECTED Final   Enterobacter cloacae complex NOT DETECTED NOT DETECTED Final   Escherichia coli NOT DETECTED NOT DETECTED Final   Klebsiella aerogenes NOT DETECTED NOT DETECTED Final   Klebsiella oxytoca NOT DETECTED NOT DETECTED Final   Klebsiella pneumoniae NOT DETECTED NOT DETECTED Final   Proteus species NOT DETECTED NOT DETECTED Final   Salmonella species NOT DETECTED NOT  DETECTED Final   Serratia marcescens NOT DETECTED NOT DETECTED Final   Haemophilus influenzae NOT DETECTED NOT DETECTED Final   Neisseria meningitidis NOT DETECTED NOT DETECTED Final   Pseudomonas aeruginosa NOT DETECTED NOT DETECTED Final   Stenotrophomonas maltophilia NOT DETECTED NOT DETECTED Final   Candida albicans NOT DETECTED NOT DETECTED Final   Candida auris NOT DETECTED NOT DETECTED Final   Candida glabrata NOT DETECTED NOT DETECTED Final   Candida krusei NOT DETECTED NOT DETECTED Final   Candida parapsilosis NOT DETECTED NOT DETECTED Final   Candida tropicalis NOT DETECTED NOT DETECTED Final   Cryptococcus neoformans/gattii NOT DETECTED NOT DETECTED Final   Methicillin resistance mecA/C DETECTED (A) NOT DETECTED Final    Comment: CRITICAL RESULT CALLED TO, READ BACK BY AND VERIFIED WITH: SUSAN WATSON ON 07/29/20 AT 1200 QSD Performed at [facility], [address] [place], [place] [address]   CULTURE, BLOOD (ROUTINE X 2) w Reflex to ID Panel     Status: None (Preliminary result)   Collection Time: 07/31/20  1:09 PM   Specimen: BLOOD  Result Value Ref Range Status   Specimen Description BLOOD BLOOD LEFT HAND  Final   Special Requests   Final    BOTTLES DRAWN AEROBIC AND ANAEROBIC Blood Culture adequate volume   Culture   Final    NO GROWTH 4 DAYS Performed at [facility], [address] [place], [place] [address]    Report Status PENDING  Incomplete  CULTURE, BLOOD (ROUTINE X 2)  w Reflex to ID Panel     Status: None (Preliminary result)   Collection Time: 07/31/20  1:18 PM   Specimen: BLOOD  Result Value Ref Range Status   Specimen Description BLOOD RIGHT ANTECUBITAL  Final   Special Requests   Final    BOTTLES DRAWN AEROBIC ONLY Blood Culture results may not be optimal due to an inadequate volume of blood received in culture bottles   Culture   Final    NO GROWTH 4 DAYS Performed at [facility], [address] [place], [name]  [address]    Report Status PENDING  Incomplete     Labs: BNP (last 3 results) Recent Labs    09/16/19 0649 11/30/19 0109 07/28/20 1255  BNP 734.0* 390.9* 361.4*   Basic Metabolic Panel: Recent Labs  Lab 07/29/20 1009 07/30/20 0523 07/31/20 0533 08/01/20 0658 08/02/20 0534  NA 139 141 139 140 139  K 4.7 4.2 4.5 5.1 4.7  CL 102 105 106 105 106  CO2 27 26 26 26 25   GLUCOSE 236* 97 105* 116* 141*  BUN 40* 44* 46* 53* 47*  CREATININE 1.62* 1.45* 1.44* 1.61* 1.55*  CALCIUM 8.7* 8.7* 8.6* 8.8* 8.7*  MG  --   --  2.1  --   --   PHOS  --   --  3.7  --   --    Liver Function Tests: Recent Labs  Lab 07/29/20 1009 07/30/20 0523 07/31/20 0533 08/01/20 0658 08/02/20 0534  AST 40 32 26 28 25   ALT 28 24 22 24 20   ALKPHOS 96 93 85 87 92  BILITOT 0.8 0.6 0.9 0.9 0.8  PROT 6.6 7.0 6.7 7.1 6.5  ALBUMIN 3.2* 3.3* 3.1* 3.5 3.2*   No results for input(s): LIPASE, AMYLASE in the last 168 hours. No results for input(s): AMMONIA in the last 168 hours. CBC: Recent Labs  Lab 07/29/20 1009 07/30/20 0523 07/31/20 0533 08/01/20 0658 08/02/20 0534  WBC 8.7 9.6 7.8 8.0 7.9  HGB 14.0 14.4 15.2 14.0 13.3  HCT 42.5 43.7 44.9 42.6 38.9*  MCV 90.8 89.7 89.1 90.6 88.8  PLT [phone]   Cardiac Enzymes: No results for input(s): CKTOTAL, CKMB, CKMBINDEX, TROPONINI in the last 168 hours. BNP: Invalid input(s): POCBNP CBG: Recent Labs  Lab 08/03/20 1208 08/03/20 1656 08/03/20 2101 07/28/2020 0917 08/10/2020 1206  GLUCAP 169* 100* 191* 307* 317*   D-Dimer No results for input(s): DDIMER in the last 72 hours. Hgb A1c No results for input(s): HGBA1C in the last 72 hours. Lipid Profile No results for input(s): CHOL, HDL, LDLCALC, TRIG, CHOLHDL, LDLDIRECT in the last 72 hours. Thyroid function studies No results for input(s): TSH, T4TOTAL, T3FREE, THYROIDAB in the last 72 hours.  Invalid input(s): FREET3 Anemia work up No results for input(s): VITAMINB12, FOLATE, FERRITIN,  TIBC, IRON, RETICCTPCT in the last 72 hours. Urinalysis    Component Value Date/Time   COLORURINE YELLOW (A) 07/28/2020 1255   APPEARANCEUR HAZY (A) 07/28/2020 1255   LABSPEC 1.019 07/28/2020 1255   PHURINE 5.0 07/28/2020 1255   GLUCOSEU NEGATIVE 07/28/2020 1255   HGBUR LARGE (A) 07/28/2020 1255   BILIRUBINUR NEGATIVE 07/28/2020 1255   KETONESUR NEGATIVE 07/28/2020 1255   PROTEINUR NEGATIVE 07/28/2020 1255   NITRITE NEGATIVE 07/28/2020 1255   LEUKOCYTESUR NEGATIVE 07/28/2020 1255   Sepsis Labs Invalid input(s): PROCALCITONIN,  WBC,  LACTICIDVEN Microbiology Recent Results (from the past 240 hour(s))  Urine Culture     Status: None  Collection Time: 07/28/20 12:55 PM   Specimen: Urine, Random  Result Value Ref Range Status   Specimen Description   Final    URINE, RANDOM Performed at [facility], [address], [place], [place] [address]    Special Requests   Final    NONE Performed at [facility], [address], [place], [place] [address]    Culture   Final    NO GROWTH Performed at [facility], [place] [address] [place], [place] [address]    Report Status 07/30/2020 FINAL  Final  SARS CORONAVIRUS 2 (TAT 6-24 HRS) Nasopharyngeal Nasopharyngeal Swab     Status: None   Collection Time: 07/28/20  1:57 PM   Specimen: Nasopharyngeal Swab  Result Value Ref Range Status   SARS Coronavirus 2 NEGATIVE NEGATIVE Final    Comment: (NOTE) SARS-CoV-2 target nucleic acids are NOT DETECTED.  The SARS-CoV-2 RNA is generally detectable in upper and lower respiratory specimens during the acute phase of infection. Negative results do not preclude SARS-CoV-2 infection, do not rule out co-infections with other pathogens, and should not be used as the sole basis for treatment or other patient management decisions. Negative results must be combined with clinical observations, patient history, and epidemiological information. The expected result is  Negative.  Fact Sheet for Patients: SugarRoll.be  Fact Sheet for Healthcare Providers: https://www.woods-mathews.com/  This test is not yet approved or cleared by the [place] FDA and  has been authorized for detection and/or diagnosis of SARS-CoV-2 by FDA under an Emergency Use Authorization (EUA). This EUA will remain  in effect (meaning this test can be used) for the duration of the COVID-19 declaration under Se ction 564(b)(1) of the Act, 21 U.S.C. section 360bbb-3(b)(1), unless the authorization is terminated or revoked sooner.  Performed at [facility], [place] [address], [place], [place] [address]   Blood culture (routine x 2)     Status: Abnormal   Collection Time: 07/28/20  7:56 PM   Specimen: BLOOD  Result Value Ref Range Status   Specimen Description   Final    BLOOD LEFT AC Performed at [facility], [address] [place], [place] [address]    Special Requests   Final    BOTTLES DRAWN AEROBIC AND ANAEROBIC Blood Culture results may not be optimal due to an inadequate volume of blood received in culture bottles Performed at [facility], [address], [place], [place] [address]    Culture  Setup Time   Final    GRAM POSITIVE RODS ANAEROBIC BOTTLE ONLY CRITICAL RESULT CALLED TO, READ BACK BY AND VERIFIED WITH: NATHAN BELUE AT 6720 07/29/20 SDR Organism ID to follow Lind CRITICAL RESULT CALLED TO, READ BACK BY AND VERIFIED WITH: SUSAN WATSON ON 07/29/20 AT 1200 QSD Performed at [facility], [address] [place], [place] [address]    Culture (A)  Final    STAPHYLOCOCCUS HOMINIS BACILLUS SPECIES THE SIGNIFICANCE OF ISOLATING THIS ORGANISM FROM A SINGLE SET OF BLOOD CULTURES WHEN MULTIPLE SETS ARE DRAWN IS UNCERTAIN. PLEASE NOTIFY THE MICROBIOLOGY DEPARTMENT WITHIN ONE WEEK IF SPECIATION AND SENSITIVITIES ARE REQUIRED. Performed at [facility], [place] [address], [place], [place] [address]    Report Status 08/01/2020 FINAL  Final  Blood culture (routine x 2)     Status: Abnormal (Preliminary result)   Collection Time: 07/28/20  7:56 PM   Specimen: BLOOD  Result Value Ref Range Status  Specimen Description   Final    BLOOD RIGHT HAND Performed at [facility], [address] [place], [place] [address]    Special Requests   Final    BOTTLES DRAWN AEROBIC AND ANAEROBIC Blood Culture results may not be optimal due to an inadequate volume of blood received in culture bottles Performed at [facility], [address] [place], [place] [address]    Culture  Setup Time   Final    AEROBIC BOTTLE ONLY GRAM POSITIVE RODS GRAM POSITIVE COCCI CRITICAL VALUE NOTED.  VALUE IS CONSISTENT WITH PREVIOUSLY REPORTED AND CALLED VALUE. Performed at [facility], [address] [place], [place] [address]    Culture (A)  Final    Maryjean Morn ROSEA CULTURE REINCUBATED FOR BETTER GROWTH Performed at [facility], [place] [address], [place], [place] [address]    Report Status PENDING  Incomplete  Blood Culture ID Panel (Reflexed)     Status: Abnormal   Collection Time: 07/28/20  7:56 PM  Result Value Ref Range Status   Enterococcus faecalis NOT DETECTED NOT DETECTED Final   Enterococcus Faecium NOT DETECTED NOT DETECTED Final   Listeria monocytogenes NOT DETECTED NOT DETECTED Final   Staphylococcus species DETECTED (A) NOT DETECTED Final    Comment: CRITICAL RESULT CALLED TO, READ BACK BY AND VERIFIED WITH: SUSAN WATSON ON 07/29/20 AT 1200 QSD    Staphylococcus aureus (BCID) NOT DETECTED NOT DETECTED Final   Staphylococcus epidermidis DETECTED (A) NOT DETECTED Final    Comment: Methicillin (oxacillin) resistant coagulase negative staphylococcus. Possible blood culture contaminant (unless isolated from more than one blood culture draw or clinical case suggests pathogenicity). No antibiotic treatment is  indicated for blood  culture contaminants. CRITICAL RESULT CALLED TO, READ BACK BY AND VERIFIED WITH: SUSAN WATSON ON 07/29/20 AT 1200 QSD    Staphylococcus lugdunensis NOT DETECTED NOT DETECTED Final   Streptococcus species NOT DETECTED NOT DETECTED Final   Streptococcus agalactiae NOT DETECTED NOT DETECTED Final   Streptococcus pneumoniae NOT DETECTED NOT DETECTED Final   Streptococcus pyogenes NOT DETECTED NOT DETECTED Final   A.calcoaceticus-baumannii NOT DETECTED NOT DETECTED Final   Bacteroides fragilis NOT DETECTED NOT DETECTED Final   Enterobacterales NOT DETECTED NOT DETECTED Final   Enterobacter cloacae complex NOT DETECTED NOT DETECTED Final   Escherichia coli NOT DETECTED NOT DETECTED Final   Klebsiella aerogenes NOT DETECTED NOT DETECTED Final   Klebsiella oxytoca NOT DETECTED NOT DETECTED Final   Klebsiella pneumoniae NOT DETECTED NOT DETECTED Final   Proteus species NOT DETECTED NOT DETECTED Final   Salmonella species NOT DETECTED NOT DETECTED Final   Serratia marcescens NOT DETECTED NOT DETECTED Final   Haemophilus influenzae NOT DETECTED NOT DETECTED Final   Neisseria meningitidis NOT DETECTED NOT DETECTED Final   Pseudomonas aeruginosa NOT DETECTED NOT DETECTED Final   Stenotrophomonas maltophilia NOT DETECTED NOT DETECTED Final   Candida albicans NOT DETECTED NOT DETECTED Final   Candida auris NOT DETECTED NOT DETECTED Final   Candida glabrata NOT DETECTED NOT DETECTED Final   Candida krusei NOT DETECTED NOT DETECTED Final   Candida parapsilosis NOT DETECTED NOT DETECTED Final   Candida tropicalis NOT DETECTED NOT DETECTED Final   Cryptococcus neoformans/gattii NOT DETECTED NOT DETECTED Final   Methicillin resistance mecA/C DETECTED (A) NOT DETECTED Final    Comment: CRITICAL RESULT CALLED TO, READ BACK BY AND VERIFIED WITH: SUSAN WATSON ON 07/29/20 AT 1200 QSD Performed at [facility], [address] [place] [address] [place], [place] [address]  CULTURE, BLOOD (ROUTINE  X 2) w Reflex to ID Panel     Status: None (Preliminary result)   Collection Time: 07/31/20  1:09 PM   Specimen: BLOOD  Result Value Ref Range Status   Specimen Description BLOOD BLOOD LEFT HAND  Final   Special Requests   Final    BOTTLES DRAWN AEROBIC AND ANAEROBIC Blood Culture adequate volume   Culture   Final    NO GROWTH 4 DAYS Performed at [facility], [address] [place], [place] [address]    Report Status PENDING  Incomplete  CULTURE, BLOOD (ROUTINE X 2) w Reflex to ID Panel     Status: None (Preliminary result)   Collection Time: 07/31/20  1:18 PM   Specimen: BLOOD  Result Value Ref Range Status   Specimen Description BLOOD RIGHT ANTECUBITAL  Final   Special Requests   Final    BOTTLES DRAWN AEROBIC ONLY Blood Culture results may not be optimal due to an inadequate volume of blood received in culture bottles   Culture   Final    NO GROWTH 4 DAYS Performed at [facility], [address], [place], [place] [address]    Report Status PENDING  Incomplete    Please note: You were cared for by a hospitalist during your hospital stay. Once you are discharged, your primary care physician will handle any further medical issues. Please note that NO REFILLS for any discharge medications will be authorized once you are discharged, as it is imperative that you return to your primary care physician (or establish a relationship with a primary care physician if you do not have one) for your post hospital discharge needs so that they can reassess your need for medications and monitor your lab values.    Time coordinating discharge: 40 minutes  SIGNED:   Shelly Coss, MD  [facility] 08/08/2020, 2:52 PM Pager [phone]  If 7PM-7AM, please contact night-coverage [email or web address] Password [email or web address]

## 2020-08-04 NOTE — Progress Notes (Signed)
Report called to [employer].  Patient is ready for transport and has belongings ready.  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  AVS and all discharge papers are in AVS packet for EMS to transport to SNF.

## 2020-08-04 NOTE — Progress Notes (Signed)
PROGRESS NOTE    [name]  NTZ:[id number] DOB: 1934/07/14 DOA: 07/28/2020 PCP: [place]   Chief Complain: Altered mental status  Brief Narrative: [name] a 86 y.o.malewith medical history significant forDM, HTN, PAD status post bilateral BKA, CAD, diastolic heart failure, CKD stage IIIa, paroxysmal A. fib on Xarelto, seizure disorder, history of UTIs, chronic pain on chronic opiates, hospitalized from 2/2-2/3 with altered mental status believed related to unintentional opiate overdose with suspected underlying dementia, and recently seen in the ER on 2/22 with dysuria with urine culture showing less than 10,000 colonies, who was brought to the emergency room with altered mental status, initially showing response to Narcan but with fluctuating mental status after several hours of observation in the emergency room.  His altered mental status was thought to be secondary to hypoglycemia.  Hospital course remarkable for afib with RVR, cardiology was following. Currently hemodynamically stable. Mental status has improved,now close to baseline. Plan for skilled facility placement,SW following, patient is medically stable for discharge as soon as bed is available.  Assessment & Plan:   Active Problems:   S/P bilateral BKA (below knee amputation) (HCC)   Paroxysmal A-fib (HCC)   Seizure disorder (HCC)   CAD (coronary artery disease)   Acute toxic-metabolic encephalopathy   (HFpEF) heart failure with preserved ejection fraction (HCC)   AMS (altered mental status)   Chronic anticoagulation   Chronic kidney disease, stage 3a (HCC)   Lactic acidosis   Hypoxic   Chronic prescription opiate use   Elevated troponin   Acute encephalopathy   Altered mental status: Thought to be due to metabolic encephalopathy from hypoglycemia.  Patient is still altered and is currently oriented to place only. He was admitted about a month ago for similar reason, work-up at  that time was negative and he had improvement. He was hypoglycemic on presentation.  Currently blood sugars are normal.  CT head did not show any acute intake abnormalities.  No signs of UTI.  UDS negative.  Home gabapentin dose decreased. Monitor mental status.  He most likely has underlying dementia.  As per his wife, he has been confused on and off and more frequently since last few months.  Continue delirium precautions.  Continue supportive care  A. fib with RVR/chronic A. fib: Cardiology was following.  Hospital course remarkable for A. fib with RVR.  Now rate is controlled.Currently on diltiazem, metoprolol, Xarelto.  Continue current medications.  Monitor on telemetry.  Elevated troponin: History of nonobstructive CAD.  Troponins are chronically elevated, flat trend.  Cardiology did not recommend any ischemic work-up.  Acute hypoxic respiratory failure: Resolved.  Currently on room air  Chronic diastolic CH failure: Currently euvolemic.  Continue current medications  Seizure disorder: On Depakote which we will continue.  Also on gabapentin at home, continue at reduced dose.  Seizure precautions  CKD stage IIIb: Stable  Diabetes type 2: Hypoglycemic on presentation.  Resolved.  Continue current insulin regimen.  Monitor blood sugars  Hypertension: Currently blood stable.  Continue current medications  Peripheral artery disease: Status post bilateral BKA.  Continue atorvastatin, Xarelto  OSA: Not on CPAP  Disposition: PT recommended SN facility.  Pending disposition, difficult placement due to insurance issues.We will follow up on Monday.    Nutrition Problem: Inadequate oral intake Etiology: acute illness      DVT prophylaxis:Xarelto Code Status: DNR Family Communication: Discussed with wife on phone on 08/02/2020 Status is: Inpatient  Remains inpatient appropriate because:Unsafe d/c plan  Dispo: The patient is from: Home              Anticipated d/c is to: SNF               Patient currently is medically stable to d/c.   Difficult to place patient Yes    Consultants: Cardiology  Procedures:None  Antimicrobials:  Anti-infectives (From admission, onward)   None      Subjective: Patient seen and examined the bedside this morning.  Hemodynamically stable.  Comfortable.  Denies any complaints.  Waiting for bed at the skilled nursing  facility.   Objective: Vitals:   07/24/2020 0059 08/07/2020 0503 08/05/2020 0753 07/23/2020 1204  BP: 124/71 (!) 152/65 (!) 159/64 117/77  Pulse: (!) 52 (!) 56 (!) 53 63  Resp: 16 16 14 15   Temp: 98 F (36.7 C) 97.8 F (36.6 C) 98.1 F (36.7 C) 98.1 F (36.7 C)  TempSrc:  Oral  Oral  SpO2: 91% 92% 94% 92%  Weight:      Height:        Intake/Output Summary (Last 24 hours) at 07/26/2020 1224 Last data filed at 07/25/2020 1016 Gross per 24 hour  Intake 1157 ml  Output 1700 ml  Net -543 ml   Filed Weights   07/31/20 0451 08/01/20 0045 08/02/20 0003  Weight: 88.5 kg 88 kg 88.4 kg    Examination:   General exam: Overall comfortable, not in distress HEENT: PERRL Respiratory system:  no wheezes or crackles  Cardiovascular system: S1 & S2 heard, RRR.  Gastrointestinal system: Abdomen is nondistended, soft and nontender. Central nervous system: Alert and oriented Extremities: B/L BkA Skin: No rashes, no ulcers,no icterus     Data Reviewed: I have personally reviewed following labs and imaging studies  CBC: Recent Labs  Lab 07/28/20 1255 07/29/20 1009 07/30/20 0523 07/31/20 0533 08/01/20 0658 08/02/20 0534  WBC 7.4 8.7 9.6 7.8 8.0 7.9  NEUTROABS 4.1  --   --   --   --   --   HGB 13.3 14.0 14.4 15.2 14.0 13.3  HCT 39.5 42.5 43.7 44.9 42.6 38.9*  MCV 89.8 90.8 89.7 89.1 90.6 88.8  PLT 211 224 231 202 226 751   Basic Metabolic Panel: Recent Labs  Lab 07/28/20 1255 07/29/20 1009 07/30/20 0523 07/31/20 0533 08/01/20 0658 08/02/20 0534  NA 142 139 141 139 140 139  K 4.2 4.7 4.2 4.5 5.1 4.7   CL 109 102 105 106 105 106  CO2 25 27 26 26 26 25   GLUCOSE 79 236* 97 105* 116* 141*  BUN 40* 40* 44* 46* 53* 47*  CREATININE 1.62* 1.62* 1.45* 1.44* 1.61* 1.55*  CALCIUM 8.5* 8.7* 8.7* 8.6* 8.8* 8.7*  MG 2.2  --   --  2.1  --   --   PHOS  --   --   --  3.7  --   --    GFR: Estimated Creatinine Clearance: 38.3 mL/min (A) (by C-G formula based on SCr of 1.55 mg/dL (H)). Liver Function Tests: Recent Labs  Lab 07/29/20 1009 07/30/20 0523 07/31/20 0533 08/01/20 0658 08/02/20 0534  AST 40 32 26 28 25   ALT 28 24 22 24 20   ALKPHOS 96 93 85 87 92  BILITOT 0.8 0.6 0.9 0.9 0.8  PROT 6.6 7.0 6.7 7.1 6.5  ALBUMIN 3.2* 3.3* 3.1* 3.5 3.2*   No results for input(s): LIPASE, AMYLASE in the last 168 hours. No results for input(s): AMMONIA in the last 168 hours.  Coagulation Profile: No results for input(s): INR, PROTIME in the last 168 hours. Cardiac Enzymes: No results for input(s): CKTOTAL, CKMB, CKMBINDEX, TROPONINI in the last 168 hours. BNP (last 3 results) No results for input(s): PROBNP in the last 8760 hours. HbA1C: No results for input(s): HGBA1C in the last 72 hours. CBG: Recent Labs  Lab 08/03/20 1208 08/03/20 1656 08/03/20 2101 08/15/2020 0917 08/21/2020 1206  GLUCAP 169* 100* 191* 307* 317*   Lipid Profile: No results for input(s): CHOL, HDL, LDLCALC, TRIG, CHOLHDL, LDLDIRECT in the last 72 hours. Thyroid Function Tests: No results for input(s): TSH, T4TOTAL, FREET4, T3FREE, THYROIDAB in the last 72 hours. Anemia Panel: No results for input(s): VITAMINB12, FOLATE, FERRITIN, TIBC, IRON, RETICCTPCT in the last 72 hours. Sepsis Labs: Recent Labs  Lab 07/28/20 1255 07/28/20 1500 07/29/20 0939  PROCALCITON 0.10  --   --   LATICACIDVEN 2.2* 2.6* 2.6*    Recent Results (from the past 240 hour(s))  Urine Culture     Status: None   Collection Time: 07/28/20 12:55 PM   Specimen: Urine, Random  Result Value Ref Range Status   Specimen Description   Final    URINE,  RANDOM Performed at [facility], [address], [place], [place] [address]    Special Requests   Final    NONE Performed at [facility], [address], [place], [place] [address]    Culture   Final    NO GROWTH Performed at [facility], [place] [address], [place], [place] [address]    Report Status 07/30/2020 FINAL  Final  SARS CORONAVIRUS 2 (TAT 6-24 HRS) Nasopharyngeal Nasopharyngeal Swab     Status: None   Collection Time: 07/28/20  1:57 PM   Specimen: Nasopharyngeal Swab  Result Value Ref Range Status   SARS Coronavirus 2 NEGATIVE NEGATIVE Final    Comment: (NOTE) SARS-CoV-2 target nucleic acids are NOT DETECTED.  The SARS-CoV-2 RNA is generally detectable in upper and lower respiratory specimens during the acute phase of infection. Negative results do not preclude SARS-CoV-2 infection, do not rule out co-infections with other pathogens, and should not be used as the sole basis for treatment or other patient management decisions. Negative results must be combined with clinical observations, patient history, and epidemiological information. The expected result is Negative.  Fact Sheet for Patients: SugarRoll.be  Fact Sheet for Healthcare Providers: https://www.woods-mathews.com/  This test is not yet approved or cleared by the [place] FDA and  has been authorized for detection and/or diagnosis of SARS-CoV-2 by FDA under an Emergency Use Authorization (EUA). This EUA will remain  in effect (meaning this test can be used) for the duration of the COVID-19 declaration under Se ction 564(b)(1) of the Act, 21 U.S.C. section 360bbb-3(b)(1), unless the authorization is terminated or revoked sooner.  Performed at [facility], [place] [address], [place], [place] [address]   Blood culture (routine x 2)     Status: Abnormal   Collection Time: 07/28/20  7:56 PM   Specimen: BLOOD  Result Value Ref  Range Status   Specimen Description   Final    BLOOD LEFT AC Performed at [facility], [address] [place], [place] [address]    Special Requests   Final    BOTTLES DRAWN AEROBIC AND ANAEROBIC Blood Culture results may not be optimal due to an inadequate volume of blood received in culture bottles Performed at [facility], [address] [place],  [address]  Culture  Setup Time   Final    GRAM POSITIVE RODS ANAEROBIC BOTTLE ONLY CRITICAL RESULT CALLED TO, READ BACK BY AND VERIFIED WITH: NATHAN BELUE AT 3875 07/29/20 SDR Organism ID to follow [place] BOTTLE ONLY CRITICAL RESULT CALLED TO, READ BACK BY AND VERIFIED WITH: SUSAN WATSON ON 07/29/20 AT 1200 QSD Performed at [facility], [address] [place], [place] [address]    Culture (A)  Final    STAPHYLOCOCCUS HOMINIS BACILLUS SPECIES THE SIGNIFICANCE OF ISOLATING THIS ORGANISM FROM A SINGLE SET OF BLOOD CULTURES WHEN MULTIPLE SETS ARE DRAWN IS UNCERTAIN. PLEASE NOTIFY THE MICROBIOLOGY DEPARTMENT WITHIN ONE WEEK IF SPECIATION AND SENSITIVITIES ARE REQUIRED. Performed at [facility], [place] [address] [place], [place] [address]    Report Status 08/01/2020 FINAL  Final  Blood culture (routine x 2)     Status: Abnormal (Preliminary result)   Collection Time: 07/28/20  7:56 PM   Specimen: BLOOD  Result Value Ref Range Status   Specimen Description   Final    BLOOD RIGHT HAND Performed at [facility], [address] [place], [place] [address]    Special Requests   Final    BOTTLES DRAWN AEROBIC AND ANAEROBIC Blood Culture results may not be optimal due to an inadequate volume of blood received in culture bottles Performed at [facility] [place] [facility], [address], [place], [place] [address]    Culture  Setup Time   Final    AEROBIC BOTTLE ONLY GRAM POSITIVE RODS GRAM POSITIVE COCCI CRITICAL VALUE NOTED.  VALUE IS CONSISTENT WITH PREVIOUSLY REPORTED  AND CALLED VALUE. Performed at [facility], [address] [place], [place] [address]    Culture (A)  Final    Maryjean Morn ROSEA CULTURE REINCUBATED FOR BETTER GROWTH Performed at [facility], [place] [address], [place], [place] [address]    Report Status PENDING  Incomplete  Blood Culture ID Panel (Reflexed)     Status: Abnormal   Collection Time: 07/28/20  7:56 PM  Result Value Ref Range Status   Enterococcus faecalis NOT DETECTED NOT DETECTED Final   Enterococcus Faecium NOT DETECTED NOT DETECTED Final   Listeria monocytogenes NOT DETECTED NOT DETECTED Final   Staphylococcus species DETECTED (A) NOT DETECTED Final    Comment: CRITICAL RESULT CALLED TO, READ BACK BY AND VERIFIED WITH: SUSAN WATSON ON 07/29/20 AT 1200 QSD    Staphylococcus aureus (BCID) NOT DETECTED NOT DETECTED Final   Staphylococcus epidermidis DETECTED (A) NOT DETECTED Final    Comment: Methicillin (oxacillin) resistant coagulase negative staphylococcus. Possible blood culture contaminant (unless isolated from more than one blood culture draw or clinical case suggests pathogenicity). No antibiotic treatment is indicated for blood  culture contaminants. CRITICAL RESULT CALLED TO, READ BACK BY AND VERIFIED WITH: SUSAN WATSON ON 07/29/20 AT 1200 QSD    Staphylococcus lugdunensis NOT DETECTED NOT DETECTED Final   Streptococcus species NOT DETECTED NOT DETECTED Final   Streptococcus agalactiae NOT DETECTED NOT DETECTED Final   Streptococcus pneumoniae NOT DETECTED NOT DETECTED Final   Streptococcus pyogenes NOT DETECTED NOT DETECTED Final   A.calcoaceticus-baumannii NOT DETECTED NOT DETECTED Final   Bacteroides fragilis NOT DETECTED NOT DETECTED Final   Enterobacterales NOT DETECTED NOT DETECTED Final   Enterobacter cloacae complex NOT DETECTED NOT DETECTED Final   Escherichia coli NOT DETECTED NOT DETECTED Final   Klebsiella aerogenes NOT DETECTED NOT DETECTED Final   Klebsiella oxytoca NOT DETECTED NOT  DETECTED Final   Klebsiella  pneumoniae NOT DETECTED NOT DETECTED Final   Proteus species NOT DETECTED NOT DETECTED Final   Salmonella species NOT DETECTED NOT DETECTED Final   Serratia marcescens NOT DETECTED NOT DETECTED Final   Haemophilus influenzae NOT DETECTED NOT DETECTED Final   Neisseria meningitidis NOT DETECTED NOT DETECTED Final   Pseudomonas aeruginosa NOT DETECTED NOT DETECTED Final   Stenotrophomonas maltophilia NOT DETECTED NOT DETECTED Final   Candida albicans NOT DETECTED NOT DETECTED Final   Candida auris NOT DETECTED NOT DETECTED Final   Candida glabrata NOT DETECTED NOT DETECTED Final   Candida krusei NOT DETECTED NOT DETECTED Final   Candida parapsilosis NOT DETECTED NOT DETECTED Final   Candida tropicalis NOT DETECTED NOT DETECTED Final   Cryptococcus neoformans/gattii NOT DETECTED NOT DETECTED Final   Methicillin resistance mecA/C DETECTED (A) NOT DETECTED Final    Comment: CRITICAL RESULT CALLED TO, READ BACK BY AND VERIFIED WITH: SUSAN WATSON ON 07/29/20 AT 1200 QSD Performed at [facility], [address] [place], [place] [address]   CULTURE, BLOOD (ROUTINE X 2) w Reflex to ID Panel     Status: None (Preliminary result)   Collection Time: 07/31/20  1:09 PM   Specimen: BLOOD  Result Value Ref Range Status   Specimen Description BLOOD BLOOD LEFT HAND  Final   Special Requests   Final    BOTTLES DRAWN AEROBIC AND ANAEROBIC Blood Culture adequate volume   Culture   Final    NO GROWTH 4 DAYS Performed at [name], [address] [place], [place] [address]    Report Status PENDING  Incomplete  CULTURE, BLOOD (ROUTINE X 2) w Reflex to ID Panel     Status: None (Preliminary result)   Collection Time: 07/31/20  1:18 PM   Specimen: BLOOD  Result Value Ref Range Status   Specimen Description BLOOD RIGHT ANTECUBITAL  Final   Special Requests   Final    BOTTLES DRAWN AEROBIC ONLY Blood Culture results may not be optimal due to an inadequate  volume of blood received in culture bottles   Culture   Final    NO GROWTH 4 DAYS Performed at [facility], [address] [place], [place] [address]    Report Status PENDING  Incomplete         Radiology Studies: No results found.      Scheduled Meds: . atorvastatin  80 mg Oral QHS  . chlorthalidone  25 mg Oral Daily  . diltiazem  240 mg Oral Daily  . divalproex  500 mg Oral Daily  . ferrous sulfate  325 mg Oral Q breakfast  . gabapentin  100 mg Oral TID  . insulin aspart  0-15 Units Subcutaneous TID WC  . insulin aspart  0-5 Units Subcutaneous QHS  . insulin glargine  15 Units Subcutaneous QHS  . isosorbide mononitrate  60 mg Oral Daily  . metoprolol succinate  25 mg Oral Q breakfast  . multivitamin with minerals  1 tablet Oral Daily  . pantoprazole  40 mg Oral QHS  . Ensure Max Protein  11 oz Oral BID  . Rivaroxaban  15 mg Oral Q supper   Continuous Infusions:   LOS: 6 days    Time spent: 35 mins.More than 50% of that time was spent in counseling and/or coordination of care.      Shelly Coss, MD [facility] P3/14/2022, 12:24 PM

## 2020-08-04 NOTE — TOC Transition Note (Signed)
Transition of Care [facility]) - CM/SW Discharge Note   Patient Details  Name: [name] MRN: [id number] Date of Birth: 12/20/1934  [facility] of [facility] CM/SW Contact:  Shelbie Hutching, RN Phone Number: 07/30/2020, 2:53 PM   Clinical Narrative:    Insurance authorization approved.  Approval information given to [place] at [employer].  Patient is going to room 313B.  Bedside RN will call report to [phone].  Once COVID is back RNCM will arrange EMS transport.  RNCM will call and update patient's wife.    Final next level of care: Skilled Nursing Facility Barriers to Discharge: Barriers Resolved   Patient Goals and CMS Choice   CMS Medicare.gov Compare Post Acute Care list provided to:: Patient Represenative (must comment) Choice offered to / list presented to : Spouse  Discharge Placement              Patient [facility] bed at: [facility] Patient to be transferred to facility by: [facility] Name of family member notified: Everlene Farrier Patient and family notified of of transfer: 07/25/2020  Discharge Plan and Services                          HH Arranged: NA          Social Determinants of Health (SDOH) Interventions     Readmission Risk Interventions Readmission Risk Prevention Plan 03/07/2020 02/12/2019 12/25/2018  Transportation Screening Complete - Complete  PCP or Specialist Appt within 3-5 Days Complete - Complete  HRI or Home Care Consult Complete - Complete  Social Work Consult for [place] Planning/Counseling Complete - Complete  Palliative Care Screening Not Applicable - Not Applicable  Medication Review Press photographer) Complete - Complete  PCP or Specialist appointment within 3-5 days of discharge - Not Complete -  PCP/Specialist Appt Not Complete comments - not ready for dc, family requests SNF placement -  HRI or Home Care Consult - Complete -  SW Recovery Care/Counseling Consult - Complete -  Palliative Care  Screening - Not Applicable -  [place] - Complete -  Some recent data might be hidden

## 2020-08-04 NOTE — Progress Notes (Signed)
Physical Therapy Treatment Patient Details Name: [name] MRN: [id number] DOB: Jul 15, 1934 Today's Date: 07/22/2020    History of Present Illness [name] is a 86 y.o. male with medical history significant for DM, HTN, PAD status post bilateral BKA, CAD, diastolic heart failure, CKD stage IIIa, paroxysmal A. fib on Xarelto, seizure disorder, history of UTIs, chronic pain on chronic opiates, hospitalized from 2/2-2/3 with altered mental status believed related to unintentional opiate overdose with suspected underlying dementia, and recently seen in the ER on 2/22 with dysuria with urine culture showing less than 10,000 colonies, who was brought to the emergency room with altered mental status, initially showing response to Narcan but with fluctuating mental status after several hours of observation in the emergency room.  History is limited due to persistent confusion.  Patient was apparently found to have decreased responsiveness from his baseline on arrival of his home health nurse and EMS was activated    PT Comments    Pt in bed with prosthetics on asking to get in chair and call wife.  To EOB with min guard.  Steady in sitting.  He stated today he stands for transfers with prosthesis.  Stood x a x 2 to stand with heavy reliance on walker for support and some forward lean.  Stood x 2 and for inc BM care.  He is unable step in place or SLS on either leg for transfers.  He is able to lateral scoot transfer to drop arm recliner at bedside with min a x 2 for safety.  Tech and RN instructed on transfer and voiced understanding.  In recliner with needs met at end of session.  Will need further clarification and true PLOF as he is a poor historian.   Follow Up Recommendations  SNF;Supervision/Assistance - 24 hour     Equipment Recommendations  None recommended by PT    Recommendations for Other Services       Precautions / Restrictions Precautions Precautions:  Fall Restrictions Weight Bearing Restrictions: No RLE Weight Bearing: Weight bearing as tolerated LLE Weight Bearing: Weight bearing as tolerated Other Position/Activity Restrictions: bilateral LE amputee, prosthetics in room    Mobility  Bed Mobility Overal bed mobility: Needs Assistance       Supine to sit: Min guard Sit to supine: Min guard;Supervision        Transfers Overall transfer level: Needs assistance Equipment used: Rolling walker (2 wheeled) Transfers: Sit to/from Stand Sit to Stand: Mod assist;+2 physical assistance        Lateral/Scoot Transfers: Min assist;+2 physical assistance    Ambulation/Gait             General Gait Details: unable to step for transfers   Stairs             Wheelchair Mobility    Modified Rankin (Stroke Patients Only)       Balance Overall balance assessment: Needs assistance Sitting-balance support: No upper extremity supported;Feet unsupported Sitting balance-Leahy Scale: Fair     Standing balance support: Bilateral upper extremity supported Standing balance-Leahy Scale: Poor Standing balance comment: heavy reliance on RW with forward lean                            Cognition Arousal/Alertness: Awake/alert Behavior During Therapy: WFL for tasks assessed/performed Overall Cognitive Status: No family/caregiver present to determine baseline cognitive functioning  Exercises      General Comments        Pertinent Vitals/Pain Pain Assessment: No/denies pain    Home Living                      Prior Function            PT Goals (current goals can now be found in the care plan section) Progress towards PT goals: Progressing toward goals    Frequency    Min 2X/week      PT Plan Current plan remains appropriate    Co-evaluation              AM-PAC PT "6 Clicks" Mobility   Outcome Measure  Help needed  turning from your back to your side while in a flat bed without using bedrails?: A Little Help needed moving from lying on your back to sitting on the side of a flat bed without using bedrails?: A Little Help needed moving to and from a bed to a chair (including a wheelchair)?: A Lot Help needed standing up from a chair using your arms (e.g., wheelchair or bedside chair)?: A Lot Help needed to walk in hospital room?: Total Help needed climbing 3-5 steps with a railing? : Total 6 Click Score: 12    End of Session Equipment Utilized During Treatment: Gait belt Activity Tolerance: Patient tolerated treatment well Patient left: in bed;with call bell/phone within reach;with bed alarm set Nurse Communication: Mobility status PT Visit Diagnosis: Other abnormalities of gait and mobility (R26.89);Muscle weakness (generalized) (M62.81)     Time: 1105-1132 PT Time Calculation (min) (ACUTE ONLY): 27 min  Charges:  $Therapeutic Exercise: 8-22 mins $Therapeutic Activity: 8-22 mins                     , PTA 08/09/2020, 1:38 PM  

## 2020-08-04 NOTE — Progress Notes (Signed)
Inpatient Diabetes Program Recommendations  AACE/ADA: New Consensus Statement on Inpatient Glycemic Control (2015)  Target Ranges:  Prepandial:   less than 140 mg/dL      Peak postprandial:   less than 180 mg/dL (1-2 hours)      Critically ill patients:  140 - 180 mg/dL   Results for [name], [name] (MRN [id number]) as of 08/12/2020 13:31  Ref. Range 08/16/2020 09:17 08/03/2020 12:06  Glucose-Capillary Latest Ref Range: 70 - 99 mg/dL 307 (H) 317 (H)    Home DM Meds: Basaglar 25 units QHS        Novolog 4 units TID            Metformin 500 mg BID    Current Orders: Lantus 15 units QHS       Novolog 0-15 units ac/hs     MD- Please consider increasing Lantus to 20 units QHS    --Will follow patient during hospitalization--  Wyn Quaker RN, MSN, CDE Diabetes Coordinator Inpatient Glycemic Control Team Team Pager: [phone] (8a-5p)

## 2020-08-04 NOTE — TOC Progression Note (Signed)
Transition of Care [facility]) - Progression Note    Patient Details  Name: [name] MRN: [id number] Date of Birth: April 05, 1935  [facility]) CM/SW [place], RN Phone Number: 07/22/2020, 1:11 PM  Clinical Narrative:   Received a call from [facility] requesting current PT/OT evaluation documentation to be faxed to determine SNF authorization. Information faxed [phone] as requested by [facility].      Barriers to Discharge: Insurance Authorization  Expected Discharge Plan and Services                                                 Social Determinants of Health (SDOH) Interventions    Readmission Risk Interventions Readmission Risk Prevention Plan 03/07/2020 02/12/2019 12/25/2018  Transportation Screening Complete - Complete  PCP or Specialist Appt within 3-5 Days Complete - Complete  HRI or Home Care Consult Complete - Complete  Social Work Consult for [place] Planning/Counseling Complete - Complete  Palliative Care Screening Not Applicable - Not Applicable  Medication Review Press photographer) Complete - Complete  PCP or Specialist appointment within 3-5 days of discharge - Not Complete -  PCP/Specialist Appt Not Complete comments - not ready for dc, family requests SNF placement -  HRI or Home Care Consult - Complete -  SW Recovery Care/Counseling Consult - Complete -  Palliative Care Screening - Not Applicable -  [place] - Complete -  Some recent data might be hidden

## 2020-08-04 NOTE — Progress Notes (Signed)
Patient discharged to [facility].  AVS given to EMS for [facility] nurse.  Patient takes all belongings with him and is transported to SNF via stretcher and EMS.

## 2020-08-05 LAB — CULTURE, BLOOD (ROUTINE X 2)
Culture: NO GROWTH
Culture: NO GROWTH
Special Requests: ADEQUATE

## 2020-08-15 LAB — MISC LABCORP TEST (SEND OUT): Labcorp test code: 9985

## 2020-08-15 LAB — CULTURE, BLOOD (ROUTINE X 2)

## 2020-08-22 NOTE — Death Summary Note (Deleted)
Physician Discharge Summary  Chong Wojdyla FKC:[id number] DOB: February 21, 1935 DOA: 07/28/2020  PCP: [place] date: 07/28/2020 Discharge date: 08/09/2020  Admitted From: Home Disposition:  SNF  Discharge Condition:Stable CODE STATUS:DNR Diet recommendation: Heart Healthy    Brief/Interim Summary:  Welles Walthall a 86 y.o.malewith medical history significant forDM, HTN, PAD status post bilateral BKA, CAD, diastolic heart failure, CKD stage IIIa, paroxysmal A. fib on Xarelto, seizure disorder, history of UTIs, chronic pain on chronic opiates, hospitalized from 2/2-2/3 with altered mental status believed related to unintentional opiate overdose with suspected underlying dementia, and recently seen in the ER on 2/22 with dysuria with urine culture showing less than 10,000 colonies, who was brought to the emergency room with altered mental status, initially showing response to Narcan but with fluctuating mental status after several hours of observation in the emergency room.  His altered mental status was thought to be secondary to hypoglycemia.  Hospital course remarkable for afib with RVR, cardiology was following. Currently hemodynamically stable. Mental status has improved,now close to baseline. Plan for skilled facility placement,SW following, patient is medically stable for discharge .  Following problems were addressed during his hospitalization:  Altered mental status: Thought to be due to metabolic encephalopathy from hypoglycemia.  Patient is still altered and is currently oriented to place only. He was admitted about a month ago for similar reason, work-up at that time was negative and he had improvement. He was hypoglycemic on presentation.  Currently blood sugars are normal.  CT head did not show any acute intake abnormalities.  No signs of UTI.  UDS negative.  Home gabapentin dose decreased. Monitor mental status.  He most likely has underlying dementia.   As per his wife, he has been confused on and off and more frequently since last few months.  Continue delirium precautions.  Continue supportive care  A. fib with RVR/chronic A. fib: Cardiology was following.  Hospital course remarkable for A. fib with RVR.  Now rate is controlled.Currently on diltiazem, metoprolol, Xarelto.  Continue current medications.  Monitor on telemetry.  Elevated troponin: History of nonobstructive CAD.  Troponins are chronically elevated, flat trend.  Cardiology did not recommend any ischemic work-up.  Acute hypoxic respiratory failure: Resolved.  Currently on room air  Chronic diastolic CH failure: Currently euvolemic.  Continue current medications  Seizure disorder: On Depakote which we will continue.  Also on gabapentin at home, continue at reduced dose.  Seizure precautions  CKD stage IIIb: Stable  Diabetes type 2: Hypoglycemic on presentation.  Resolved.  Continue current insulin regimen.  Monitor blood sugars  Hypertension: Currently blood stable.  Continue current medications  Peripheral artery disease: Status post bilateral BKA.  Continue atorvastatin, Xarelto  OSA: Not on CPAP  Disposition: PT recommended SN facility.   Discharge Diagnoses:  Active Problems:   S/P bilateral BKA (below knee amputation) (HCC)   Paroxysmal A-fib (HCC)   Seizure disorder (HCC)   CAD (coronary artery disease)   Acute toxic-metabolic encephalopathy   (HFpEF) heart failure with preserved ejection fraction (HCC)   AMS (altered mental status)   Chronic anticoagulation   Chronic kidney disease, stage 3a (HCC)   Lactic acidosis   Hypoxic   Chronic prescription opiate use   Elevated troponin   Acute encephalopathy    Discharge Instructions  Discharge Instructions    Diet - low sodium heart healthy   Complete by: As directed    Discharge instructions   Complete by: As directed  1)Please take prescribed medications as instructed 2)Do a CBC and BMP  tests in a week 3)We recommend close monitoring of glucose  and adjustment of insulin as needed.   Increase activity slowly   Complete by: As directed      Allergies as of 07/31/2020      Reactions   Contrast Media [iodinated Diagnostic Agents] Shortness Of Breath   SOB   Iohexol Shortness Of Breath    Desc: Respiratory Distress, Laryngedema, diaphoresis, Onset Date: [id number]   Metrizamide Shortness Of Breath   SOB SOB SOB   Latex Rash      Medication List    STOP taking these medications   diltiazem 120 MG 24 hr capsule Commonly known as: DILACOR XR Replaced by: diltiazem 240 MG 24 hr capsule   Klor-Con M20 20 MEQ tablet Generic drug: potassium chloride SA   Tiadylt ER 120 MG 24 hr capsule Generic drug: diltiazem     TAKE these medications   atorvastatin 80 MG tablet Commonly known as: LIPITOR Take 80 mg by mouth at bedtime.   Basaglar KwikPen 100 UNIT/ML Inject 20 Units into the skin at bedtime. What changed: how much to take   bisacodyl 5 MG EC tablet Commonly known as: DULCOLAX Take 1 tablet (5 mg total) by mouth daily as needed for moderate constipation.   chlorthalidone 25 MG tablet Commonly known as: HYGROTON Take 25 mg by mouth daily.   diltiazem 240 MG 24 hr capsule Commonly known as: CARDIZEM CD Take 1 capsule (240 mg total) by mouth daily. Start taking on: August 05, 2020 Replaces: diltiazem 120 MG 24 hr capsule   divalproex 500 MG DR tablet Commonly known as: DEPAKOTE Take 500 mg by mouth daily.   Ensure Max Protein Liqd Take 330 mLs (11 oz total) by mouth 2 (two) times daily.   ferrous sulfate 325 (65 FE) MG tablet Take 325 mg by mouth daily with breakfast.   gabapentin 100 MG capsule Commonly known as: NEURONTIN Take 1 capsule (100 mg total) by mouth 3 (three) times daily. What changed:   medication strength  how much to take   isosorbide mononitrate 30 MG 24 hr tablet Commonly known as: IMDUR Take 60 mg by mouth daily.    metFORMIN 500 MG tablet Commonly known as: GLUCOPHAGE Take 500 mg by mouth 2 (two) times daily with a meal.   metoprolol succinate 25 MG 24 hr tablet Commonly known as: TOPROL-XL Take 1 tablet (25 mg total) by mouth daily. Take with or immediately following a meal.   NovoLOG FlexPen 100 UNIT/ML FlexPen Generic drug: insulin aspart Inject 4 Units into the skin 3 (three) times daily with meals.   pantoprazole 40 MG tablet Commonly known as: PROTONIX Take 1 tablet (40 mg total) by mouth at bedtime for 14 days.   Rivaroxaban 15 MG Tabs tablet Commonly known as: XARELTO Take 1 tablet (15 mg total) by mouth daily with supper. Hold Xarelto for 3 days.  Start on January 09, 2019.       Contact information for after-discharge care    [facility] .   Service: Skilled Nursing Contact information: [place] [phone]                 Allergies  Allergen Reactions  . Contrast Media [Iodinated Diagnostic Agents] Shortness Of Breath    SOB  . Iohexol Shortness Of Breath  Desc: Respiratory Distress, Laryngedema, diaphoresis, Onset Date: [id number]   . Metrizamide Shortness Of Breath    SOB SOB SOB   . Latex Rash    Consultations: None  Procedures/Studies: CT Head Wo Contrast  Result Date: 07/28/2020 CLINICAL DATA:  Mental status changes. EXAM: CT HEAD WITHOUT CONTRAST TECHNIQUE: Contiguous axial images were obtained from the base of the skull through the vertex without intravenous contrast. COMPARISON:  MRI 06/26/2020.  CT scan 06/25/2020. FINDINGS: Brain: There is no evidence for acute hemorrhage, hydrocephalus, mass lesion, or abnormal extra-axial fluid collection. No definite CT evidence for acute infarction. Diffuse loss of parenchymal volume is consistent with atrophy. Patchy low attenuation in the deep hemispheric and periventricular  white matter is nonspecific, but likely reflects chronic microvascular ischemic demyelination. Vascular: No hyperdense vessel or unexpected calcification. Skull: No evidence for fracture. No worrisome lytic or sclerotic lesion. Sinuses/Orbits: The visualized paranasal sinuses and mastoid air cells are clear. Visualized portions of the globes and intraorbital fat are unremarkable. Other: None. IMPRESSION: 1. No acute intracranial abnormality. 2. Atrophy with chronic small vessel white matter ischemic disease. Electronically Signed   By: Misty Stanley M.D.   On: 07/28/2020 13:35   DG Chest Portable 1 View  Result Date: 07/28/2020 CLINICAL DATA:  86 year old with acute mental status changes and open coil bore responsiveness". Current history of CHF, hypertension. EXAM: PORTABLE CHEST 1 VIEW COMPARISON:  06/25/2020 and earlier, including CT chest 11/30/2019. FINDINGS: Cardiac silhouette moderately to markedly enlarged for AP portable technique, unchanged. Pulmonary venous hypertension and likely mild interstitial pulmonary edema. No confluent airspace consolidation. No visible pleural effusions. IMPRESSION: Mild CHF and/or fluid overload is suspected, with stable moderate to marked cardiomegaly. Electronically Signed   By: Evangeline Dakin M.D.   On: 07/28/2020 17:00   ECHOCARDIOGRAM COMPLETE  Result Date: 08/01/2020    ECHOCARDIOGRAM REPORT   Patient Name:   SHERLOCK NANCARROW Date of Exam: 08/01/2020 Medical Rec #:  [id number]             Height:       67.0 in Accession #:    [id number]            Weight:       193.9 lb Date of Birth:  Dec 19, 1934            BSA:          1.996 m Patient Age:    72 years              BP:           150/66 mmHg Patient Gender: M                     HR:           66 bpm. Exam Location:  [facility] Procedure: 2D Echo, Cardiac Doppler, Color Doppler and Intracardiac            Opacification Agent Indications:     R07.9 Chest Pain  History:         Patient has prior history of  Echocardiogram examinations, most                  recent 11/30/2019. CHF, CKD; Risk Factors:Hypertension, Diabetes                  and Dyslipidemia. Hx of COVID-19 in 01/2019.  Sonographer:     Charmayne Sheer RDCS (AE) Referring Phys:  [place] Diagnosing Phys: Bartholome Bill  MD  Sonographer Comments: Technically difficult study due to poor echo windows. Image acquisition challenging due to patient body habitus and Image acquisition challenging due to uncooperative patient. IMPRESSIONS  1. Left ventricular ejection fraction, by estimation, is 45 to 50%. The left ventricle has mildly decreased function. The left ventricle has no regional wall motion abnormalities. Left ventricular diastolic parameters are consistent with Grade I diastolic dysfunction (impaired relaxation).  2. Right ventricular systolic function is normal. The right ventricular size is normal.  3. Right atrial size was mildly dilated.  4. The mitral valve was not well visualized. Trivial mitral valve regurgitation.  5. The aortic valve was not well visualized. Aortic valve regurgitation is trivial.  6. Aortic dilatation noted. There is borderline dilatation of the aortic root, measuring 36 mm. FINDINGS  Left Ventricle: Left ventricular ejection fraction, by estimation, is 45 to 50%. The left ventricle has mildly decreased function. The left ventricle has no regional wall motion abnormalities. Definity contrast agent was given IV to delineate the left ventricular endocardial borders. The left ventricular internal cavity size was normal in size. There is no left ventricular hypertrophy. Left ventricular diastolic parameters are consistent with Grade I diastolic dysfunction (impaired relaxation). Right Ventricle: The right ventricular size is normal. No increase in right ventricular wall thickness. Right ventricular systolic function is normal. Left Atrium: Left atrial size was normal in size. Right Atrium: Right atrial size was mildly dilated.  Pericardium: There is no evidence of pericardial effusion. Mitral Valve: The mitral valve was not well visualized. Trivial mitral valve regurgitation. MV peak gradient, 3.2 mmHg. The mean mitral valve gradient is 2.0 mmHg. Tricuspid Valve: The tricuspid valve is not well visualized. Tricuspid valve regurgitation is mild. Aortic Valve: The aortic valve was not well visualized. Aortic valve regurgitation is trivial. Aortic valve mean gradient measures 4.0 mmHg. Aortic valve peak gradient measures 7.0 mmHg. Aortic valve area, by VTI measures 3.07 cm. Pulmonic Valve: The pulmonic valve was not well visualized. Pulmonic valve regurgitation is trivial. Aorta: Aortic dilatation noted. There is borderline dilatation of the aortic root, measuring 36 mm. IAS/Shunts: The interatrial septum was not well visualized.  LEFT VENTRICLE PLAX 2D LVIDd:         6.30 cm      Diastology LVIDs:         4.95 cm      LV e' medial:    3.81 cm/s LV PW:         1.00 cm      LV E/e' medial:  15.9 LV IVS:        0.90 cm      LV e' lateral:   3.48 cm/s LVOT diam:     2.40 cm      LV E/e' lateral: 17.4 LV SV:         82 LV SV Index:   41 LVOT Area:     4.52 cm  LV Volumes (MOD) LV vol d, MOD A2C: 107.0 ml LV vol d, MOD A4C: 105.0 ml LV vol s, MOD A2C: 68.3 ml LV vol s, MOD A4C: 67.3 ml LV SV MOD A2C:     38.7 ml LV SV MOD A4C:     105.0 ml LV SV MOD BP:      46.2 ml RIGHT VENTRICLE RV Basal diam:  3.40 cm LEFT ATRIUM             Index       RIGHT ATRIUM  Index LA diam:        5.70 cm 2.86 cm/m  RA Area:     15.00 cm LA Vol (A2C):   70.1 ml 35.12 ml/m RA Volume:   32.70 ml  16.38 ml/m LA Vol (A4C):   67.6 ml 33.87 ml/m LA Biplane Vol: 69.9 ml 35.02 ml/m  AORTIC VALVE AV Area (Vmax):    2.73 cm AV Area (Vmean):   2.76 cm AV Area (VTI):     3.07 cm AV Vmax:           132.00 cm/s AV Vmean:          89.700 cm/s AV VTI:            0.267 m AV Peak Grad:      7.0 mmHg AV Mean Grad:      4.0 mmHg LVOT Vmax:         79.60 cm/s LVOT  Vmean:        54.700 cm/s LVOT VTI:          0.181 m LVOT/AV VTI ratio: 0.68  AORTA Ao Root diam: 3.60 cm MITRAL VALVE MV Area (PHT): 6.17 cm    SHUNTS MV Area VTI:   3.91 cm    Systemic VTI:  0.18 m MV Peak grad:  3.2 mmHg    Systemic Diam: 2.40 cm MV Mean grad:  2.0 mmHg MV Vmax:       0.89 m/s MV Vmean:      58.3 cm/s MV Decel Time: 123 msec MV E velocity: 60.40 cm/s MV A velocity: 95.55 cm/s MV E/A ratio:  0.63 Bartholome Bill MD Electronically signed by Bartholome Bill MD Signature Date/Time: 08/01/2020/1:09:25 PM    Final        Subjective:  Patient seen and examined the bedside this morning.  Hemodynamically stable for discharge today. Discharge Exam: Vitals:   08/21/2020 0753 08/03/2020 1204  BP: (!) 159/64 117/77  Pulse: (!) 53 63  Resp: 14 15  Temp: 98.1 F (36.7 C) 98.1 F (36.7 C)  SpO2: 94% 92%   Vitals:   08/13/2020 0059 08/07/2020 0503 07/22/2020 0753 08/12/2020 1204  BP: 124/71 (!) 152/65 (!) 159/64 117/77  Pulse: (!) 52 (!) 56 (!) 53 63  Resp: 16 16 14 15   Temp: 98 F (36.7 C) 97.8 F (36.6 C) 98.1 F (36.7 C) 98.1 F (36.7 C)  TempSrc:  Oral  Oral  SpO2: 91% 92% 94% 92%  Weight:      Height:        General: Pt is alert, awake, not in acute distress Cardiovascular: RRR, S1/S2 +, no rubs, no gallops Respiratory: CTA bilaterally, no wheezing, no rhonchi Abdominal: Soft, NT, ND, bowel sounds + Extremities: B/L BKA    The results of significant diagnostics from this hospitalization (including imaging, microbiology, ancillary and laboratory) are listed below for reference.     Microbiology: Recent Results (from the past 240 hour(s))  Urine Culture     Status: None   Collection Time: 07/28/20 12:55 PM   Specimen: Urine, Random  Result Value Ref Range Status   Specimen Description   Final    URINE, RANDOM Performed at [facility], [address], [place], [place] [address]    Special Requests   Final    NONE Performed at [facility], [address] [place], [place] [address]    Culture   Final    NO GROWTH Performed at [facility], [place]  [address] [place], [place] [address]    Report Status 07/30/2020 FINAL  Final  SARS CORONAVIRUS 2 (TAT 6-24 HRS) Nasopharyngeal Nasopharyngeal Swab     Status: None   Collection Time: 07/28/20  1:57 PM   Specimen: Nasopharyngeal Swab  Result Value Ref Range Status   SARS Coronavirus 2 NEGATIVE NEGATIVE Final    Comment: (NOTE) SARS-CoV-2 target nucleic acids are NOT DETECTED.  The SARS-CoV-2 RNA is generally detectable in upper and lower respiratory specimens during the acute phase of infection. Negative results do not preclude SARS-CoV-2 infection, do not rule out co-infections with other pathogens, and should not be used as the sole basis for treatment or other patient management decisions. Negative results must be combined with clinical observations, patient history, and epidemiological information. The expected result is Negative.  Fact Sheet for Patients: SugarRoll.be  Fact Sheet for Healthcare Providers: https://www.woods-mathews.com/  This test is not yet approved or cleared by the [place] FDA and  has been authorized for detection and/or diagnosis of SARS-CoV-2 by FDA under an Emergency Use Authorization (EUA). This EUA will remain  in effect (meaning this test can be used) for the duration of the COVID-19 declaration under Se ction 564(b)(1) of the Act, 21 U.S.C. section 360bbb-3(b)(1), unless the authorization is terminated or revoked sooner.  Performed at [facility], [place] [address] [place], [place] [address]   Blood culture (routine x 2)     Status: Abnormal   Collection Time: 07/28/20  7:56 PM   Specimen: BLOOD  Result Value Ref Range Status   Specimen Description   Final    BLOOD LEFT AC Performed at [facility], [address], [place], [place] [address]    Special Requests   Final     BOTTLES DRAWN AEROBIC AND ANAEROBIC Blood Culture results may not be optimal due to an inadequate volume of blood received in culture bottles Performed at [facility], [address], [place], [place] [address]    Culture  Setup Time   Final    GRAM POSITIVE RODS ANAEROBIC BOTTLE ONLY CRITICAL RESULT CALLED TO, READ BACK BY AND VERIFIED WITH: NATHAN BELUE AT 8299 07/29/20 SDR Organism ID to follow [place] CRITICAL RESULT CALLED TO, READ BACK BY AND VERIFIED WITH: SUSAN WATSON ON 07/29/20 AT 1200 QSD Performed at [facility], [address] [place], [place] [address]    Culture (A)  Final    STAPHYLOCOCCUS HOMINIS BACILLUS SPECIES THE SIGNIFICANCE OF ISOLATING THIS ORGANISM FROM A SINGLE SET OF BLOOD CULTURES WHEN MULTIPLE SETS ARE DRAWN IS UNCERTAIN. PLEASE NOTIFY THE MICROBIOLOGY DEPARTMENT WITHIN ONE WEEK IF SPECIATION AND SENSITIVITIES ARE REQUIRED. Performed at [facility], [place] [address], [place], [place] [address]    Report Status 08/01/2020 FINAL  Final  Blood culture (routine x 2)     Status: Abnormal (Preliminary result)   Collection Time: 07/28/20  7:56 PM   Specimen: BLOOD  Result Value Ref Range Status   Specimen Description   Final    BLOOD RIGHT HAND Performed at [facility], [address] [place], [place] [address]    Special Requests   Final    BOTTLES DRAWN AEROBIC AND ANAEROBIC Blood Culture results may not be optimal due to an inadequate volume of blood received in culture bottles Performed at [facility], [address] [place], [place] [address]    Culture  Setup Time   Final    AEROBIC  BOTTLE ONLY GRAM POSITIVE RODS GRAM POSITIVE COCCI CRITICAL VALUE NOTED.  VALUE IS CONSISTENT WITH PREVIOUSLY REPORTED AND CALLED VALUE. Performed at [facility], [address], [place], [place] [address]    Culture (A)  Final    Maryjean Morn ROSEA CULTURE REINCUBATED FOR BETTER  GROWTH Performed at [facility], [place] [address], [place], [place] [address]    Report Status PENDING  Incomplete  Blood Culture ID Panel (Reflexed)     Status: Abnormal   Collection Time: 07/28/20  7:56 PM  Result Value Ref Range Status   Enterococcus faecalis NOT DETECTED NOT DETECTED Final   Enterococcus Faecium NOT DETECTED NOT DETECTED Final   Listeria monocytogenes NOT DETECTED NOT DETECTED Final   Staphylococcus species DETECTED (A) NOT DETECTED Final    Comment: CRITICAL RESULT CALLED TO, READ BACK BY AND VERIFIED WITH: SUSAN WATSON ON 07/29/20 AT 1200 QSD    Staphylococcus aureus (BCID) NOT DETECTED NOT DETECTED Final   Staphylococcus epidermidis DETECTED (A) NOT DETECTED Final    Comment: Methicillin (oxacillin) resistant coagulase negative staphylococcus. Possible blood culture contaminant (unless isolated from more than one blood culture draw or clinical case suggests pathogenicity). No antibiotic treatment is indicated for blood  culture contaminants. CRITICAL RESULT CALLED TO, READ BACK BY AND VERIFIED WITH: SUSAN WATSON ON 07/29/20 AT 1200 QSD    Staphylococcus lugdunensis NOT DETECTED NOT DETECTED Final   Streptococcus species NOT DETECTED NOT DETECTED Final   Streptococcus agalactiae NOT DETECTED NOT DETECTED Final   Streptococcus pneumoniae NOT DETECTED NOT DETECTED Final   Streptococcus pyogenes NOT DETECTED NOT DETECTED Final   A.calcoaceticus-baumannii NOT DETECTED NOT DETECTED Final   Bacteroides fragilis NOT DETECTED NOT DETECTED Final   Enterobacterales NOT DETECTED NOT DETECTED Final   Enterobacter cloacae complex NOT DETECTED NOT DETECTED Final   Escherichia coli NOT DETECTED NOT DETECTED Final   Klebsiella aerogenes NOT DETECTED NOT DETECTED Final   Klebsiella oxytoca NOT DETECTED NOT DETECTED Final   Klebsiella pneumoniae NOT DETECTED NOT DETECTED Final   Proteus species NOT DETECTED NOT DETECTED Final   Salmonella species NOT DETECTED NOT DETECTED  Final   Serratia marcescens NOT DETECTED NOT DETECTED Final   Haemophilus influenzae NOT DETECTED NOT DETECTED Final   Neisseria meningitidis NOT DETECTED NOT DETECTED Final   Pseudomonas aeruginosa NOT DETECTED NOT DETECTED Final   Stenotrophomonas maltophilia NOT DETECTED NOT DETECTED Final   Candida albicans NOT DETECTED NOT DETECTED Final   Candida auris NOT DETECTED NOT DETECTED Final   Candida glabrata NOT DETECTED NOT DETECTED Final   Candida krusei NOT DETECTED NOT DETECTED Final   Candida parapsilosis NOT DETECTED NOT DETECTED Final   Candida tropicalis NOT DETECTED NOT DETECTED Final   Cryptococcus neoformans/gattii NOT DETECTED NOT DETECTED Final   Methicillin resistance mecA/C DETECTED (A) NOT DETECTED Final    Comment: CRITICAL RESULT CALLED TO, READ BACK BY AND VERIFIED WITH: SUSAN WATSON ON 07/29/20 AT 1200 QSD Performed at [facility], [address] [place], [place] [address]   CULTURE, BLOOD (ROUTINE X 2) w Reflex to ID Panel     Status: None (Preliminary result)   Collection Time: 07/31/20  1:09 PM   Specimen: BLOOD  Result Value Ref Range Status   Specimen Description BLOOD BLOOD LEFT HAND  Final   Special Requests   Final    BOTTLES DRAWN AEROBIC AND ANAEROBIC Blood Culture adequate volume   Culture   Final    NO GROWTH 4 DAYS Performed at [facility], [address] [place], [place] [address]    Report Status PENDING  Incomplete  CULTURE, BLOOD (ROUTINE X 2) w Reflex to ID Panel     Status: None (Preliminary result)   Collection Time: 07/31/20  1:18 PM   Specimen: BLOOD  Result Value Ref Range Status   Specimen Description BLOOD RIGHT ANTECUBITAL  Final   Special Requests   Final    BOTTLES DRAWN AEROBIC ONLY Blood Culture results may not be optimal due to an inadequate volume of blood received in culture bottles   Culture   Final    NO GROWTH 4 DAYS Performed at [facility], [address] [place], [place] [address]     Report Status PENDING  Incomplete     Labs: BNP (last 3 results) Recent Labs    09/16/19 0649 11/30/19 0109 07/28/20 1255  BNP 734.0* 390.9* 540.0*   Basic Metabolic Panel: Recent Labs  Lab 07/29/20 1009 07/30/20 0523 07/31/20 0533 08/01/20 0658 08/02/20 0534  NA 139 141 139 140 139  K 4.7 4.2 4.5 5.1 4.7  CL 102 105 106 105 106  CO2 27 26 26 26 25   GLUCOSE 236* 97 105* 116* 141*  BUN 40* 44* 46* 53* 47*  CREATININE 1.62* 1.45* 1.44* 1.61* 1.55*  CALCIUM 8.7* 8.7* 8.6* 8.8* 8.7*  MG  --   --  2.1  --   --   PHOS  --   --  3.7  --   --    Liver Function Tests: Recent Labs  Lab 07/29/20 1009 07/30/20 0523 07/31/20 0533 08/01/20 0658 08/02/20 0534  AST 40 32 26 28 25   ALT 28 24 22 24 20   ALKPHOS 96 93 85 87 92  BILITOT 0.8 0.6 0.9 0.9 0.8  PROT 6.6 7.0 6.7 7.1 6.5  ALBUMIN 3.2* 3.3* 3.1* 3.5 3.2*   No results for input(s): LIPASE, AMYLASE in the last 168 hours. No results for input(s): AMMONIA in the last 168 hours. CBC: Recent Labs  Lab 07/29/20 1009 07/30/20 0523 07/31/20 0533 08/01/20 0658 08/02/20 0534  WBC 8.7 9.6 7.8 8.0 7.9  HGB 14.0 14.4 15.2 14.0 13.3  HCT 42.5 43.7 44.9 42.6 38.9*  MCV 90.8 89.7 89.1 90.6 88.8  PLT 224 231 202 226 212   Cardiac Enzymes: No results for input(s): CKTOTAL, CKMB, CKMBINDEX, TROPONINI in the last 168 hours. BNP: Invalid input(s): POCBNP CBG: Recent Labs  Lab 08/03/20 1208 08/03/20 1656 08/03/20 2101 08/01/2020 0917 07/30/2020 1206  GLUCAP 169* 100* 191* 307* 317*   D-Dimer No results for input(s): DDIMER in the last 72 hours. Hgb A1c No results for input(s): HGBA1C in the last 72 hours. Lipid Profile No results for input(s): CHOL, HDL, LDLCALC, TRIG, CHOLHDL, LDLDIRECT in the last 72 hours. Thyroid function studies No results for input(s): TSH, T4TOTAL, T3FREE, THYROIDAB in the last 72 hours.  Invalid input(s): FREET3 Anemia work up No results for input(s): VITAMINB12, FOLATE, FERRITIN, TIBC, IRON,  RETICCTPCT in the last 72 hours. Urinalysis    Component Value Date/Time   COLORURINE YELLOW (A) 07/28/2020 1255   APPEARANCEUR HAZY (A) 07/28/2020 1255   LABSPEC 1.019 07/28/2020 1255   PHURINE 5.0 07/28/2020 1255   GLUCOSEU NEGATIVE 07/28/2020 1255   HGBUR LARGE (A) 07/28/2020 1255   BILIRUBINUR NEGATIVE 07/28/2020 1255   KETONESUR NEGATIVE 07/28/2020 1255   PROTEINUR NEGATIVE 07/28/2020 1255   NITRITE NEGATIVE 07/28/2020 1255   LEUKOCYTESUR NEGATIVE 07/28/2020 1255   Sepsis Labs Invalid input(s): PROCALCITONIN,  WBC,  LACTICIDVEN Microbiology Recent Results (from the past 240 hour(s))  Urine Culture     Status: None   Collection Time: 07/28/20 12:55 PM   Specimen: Urine, Random  Result Value Ref Range Status   Specimen Description   Final    URINE, RANDOM Performed at [facility], [address] [place], [place] [address]    Special Requests   Final    NONE Performed at [facility], [address] [place], [place] [address]    Culture   Final    NO GROWTH Performed at [facility], [place] [address], [place], [place] [address]    Report Status 07/30/2020 FINAL  Final  SARS CORONAVIRUS 2 (TAT 6-24 HRS) Nasopharyngeal Nasopharyngeal Swab     Status: None   Collection Time: 07/28/20  1:57 PM   Specimen: Nasopharyngeal Swab  Result Value Ref Range Status   SARS Coronavirus 2 NEGATIVE NEGATIVE Final    Comment: (NOTE) SARS-CoV-2 target nucleic acids are NOT DETECTED.  The SARS-CoV-2 RNA is generally detectable in upper and lower respiratory specimens during the acute phase of infection. Negative results do not preclude SARS-CoV-2 infection, do not rule out co-infections with other pathogens, and should not be used as the sole basis for treatment or other patient management decisions. Negative results must be combined with clinical observations, patient history, and epidemiological information. The expected result is Negative.  Fact Sheet  for Patients: SugarRoll.be  Fact Sheet for Healthcare Providers: https://www.woods-mathews.com/  This test is not yet approved or cleared by the [place] FDA and  has been authorized for detection and/or diagnosis of SARS-CoV-2 by FDA under an Emergency Use Authorization (EUA). This EUA will remain  in effect (meaning this test can be used) for the duration of the COVID-19 declaration under Se ction 564(b)(1) of the Act, 21 U.S.C. section 360bbb-3(b)(1), unless the authorization is terminated or revoked sooner.  Performed at [facility], [place] [address] [place], [place] [address]   Blood culture (routine x 2)     Status: Abnormal   Collection Time: 07/28/20  7:56 PM   Specimen: BLOOD  Result Value Ref Range Status   Specimen Description   Final    BLOOD LEFT AC Performed at [facility], [address], [place], [place] [address]    Special Requests   Final    BOTTLES DRAWN AEROBIC AND ANAEROBIC Blood Culture results may not be optimal due to an inadequate volume of blood received in culture bottles Performed at [facility], [address], [place], [place] [address]    Culture  Setup Time   Final    GRAM POSITIVE RODS ANAEROBIC BOTTLE ONLY CRITICAL RESULT CALLED TO, READ BACK BY AND VERIFIED WITH: NATHAN BELUE AT 2620 07/29/20 SDR Organism ID to follow [place] CRITICAL RESULT CALLED TO, READ BACK BY AND VERIFIED WITH: SUSAN WATSON ON 07/29/20 AT 1200 QSD Performed at [facility], [address] [place], [place] [address]    Culture (A)  Final    STAPHYLOCOCCUS HOMINIS BACILLUS SPECIES THE SIGNIFICANCE OF ISOLATING THIS ORGANISM FROM A SINGLE SET OF BLOOD CULTURES WHEN MULTIPLE SETS ARE DRAWN IS UNCERTAIN. PLEASE NOTIFY THE MICROBIOLOGY DEPARTMENT WITHIN ONE WEEK IF SPECIATION AND SENSITIVITIES ARE REQUIRED. Performed at [facility], [place] [address],  [place], [place] [address]    Report Status 08/01/2020 FINAL  Final  Blood culture (routine x 2)  Status: Abnormal (Preliminary result)   Collection Time: 07/28/20  7:56 PM   Specimen: BLOOD  Result Value Ref Range Status   Specimen Description   Final    BLOOD RIGHT HAND Performed at [facility], [address] [place], [place] [address]    Special Requests   Final    BOTTLES DRAWN AEROBIC AND ANAEROBIC Blood Culture results may not be optimal due to an inadequate volume of blood received in culture bottles Performed at [facility], [address] [place], [place] [address]    Culture  Setup Time   Final    AEROBIC BOTTLE ONLY GRAM POSITIVE RODS GRAM POSITIVE COCCI CRITICAL VALUE NOTED.  VALUE IS CONSISTENT WITH PREVIOUSLY REPORTED AND CALLED VALUE. Performed at [facility], [address] [place], [place] [address]    Culture (A)  Final    Maryjean Morn ROSEA CULTURE REINCUBATED FOR BETTER GROWTH Performed at [facility], [place] [address], [place],  [address]    Report Status PENDING  Incomplete  Blood Culture ID Panel (Reflexed)     Status: Abnormal   Collection Time: 07/28/20  7:56 PM  Result Value Ref Range Status   Enterococcus faecalis NOT DETECTED NOT DETECTED Final   Enterococcus Faecium NOT DETECTED NOT DETECTED Final   Listeria monocytogenes NOT DETECTED NOT DETECTED Final   Staphylococcus species DETECTED (A) NOT DETECTED Final    Comment: CRITICAL RESULT CALLED TO, READ BACK BY AND VERIFIED WITH: SUSAN WATSON ON 07/29/20 AT 1200 QSD    Staphylococcus aureus (BCID) NOT DETECTED NOT DETECTED Final   Staphylococcus epidermidis DETECTED (A) NOT DETECTED Final    Comment: Methicillin (oxacillin) resistant coagulase negative staphylococcus. Possible blood culture contaminant (unless isolated from more than one blood culture draw or clinical case suggests pathogenicity). No antibiotic treatment is indicated for blood  culture  contaminants. CRITICAL RESULT CALLED TO, READ BACK BY AND VERIFIED WITH: SUSAN WATSON ON 07/29/20 AT 1200 QSD    Staphylococcus lugdunensis NOT DETECTED NOT DETECTED Final   Streptococcus species NOT DETECTED NOT DETECTED Final   Streptococcus agalactiae NOT DETECTED NOT DETECTED Final   Streptococcus pneumoniae NOT DETECTED NOT DETECTED Final   Streptococcus pyogenes NOT DETECTED NOT DETECTED Final   A.calcoaceticus-baumannii NOT DETECTED NOT DETECTED Final   Bacteroides fragilis NOT DETECTED NOT DETECTED Final   Enterobacterales NOT DETECTED NOT DETECTED Final   Enterobacter cloacae complex NOT DETECTED NOT DETECTED Final   Escherichia coli NOT DETECTED NOT DETECTED Final   Klebsiella aerogenes NOT DETECTED NOT DETECTED Final   Klebsiella oxytoca NOT DETECTED NOT DETECTED Final   Klebsiella pneumoniae NOT DETECTED NOT DETECTED Final   Proteus species NOT DETECTED NOT DETECTED Final   Salmonella species NOT DETECTED NOT DETECTED Final   Serratia marcescens NOT DETECTED NOT DETECTED Final   Haemophilus influenzae NOT DETECTED NOT DETECTED Final   Neisseria meningitidis NOT DETECTED NOT DETECTED Final   Pseudomonas aeruginosa NOT DETECTED NOT DETECTED Final   Stenotrophomonas maltophilia NOT DETECTED NOT DETECTED Final   Candida albicans NOT DETECTED NOT DETECTED Final   Candida auris NOT DETECTED NOT DETECTED Final   Candida glabrata NOT DETECTED NOT DETECTED Final   Candida krusei NOT DETECTED NOT DETECTED Final   Candida parapsilosis NOT DETECTED NOT DETECTED Final   Candida tropicalis NOT DETECTED NOT DETECTED Final   Cryptococcus neoformans/gattii NOT DETECTED NOT DETECTED Final   Methicillin resistance mecA/C DETECTED (A) NOT DETECTED Final    Comment: CRITICAL RESULT CALLED TO, READ BACK  BY AND VERIFIED WITH: SUSAN WATSON ON 07/29/20 AT 1200 QSD Performed at [facility], [address] [place], [place] [address]   CULTURE, BLOOD (ROUTINE X 2) w Reflex to ID Panel      Status: None (Preliminary result)   Collection Time: 07/31/20  1:09 PM   Specimen: BLOOD  Result Value Ref Range Status   Specimen Description BLOOD BLOOD LEFT HAND  Final   Special Requests   Final    BOTTLES DRAWN AEROBIC AND ANAEROBIC Blood Culture adequate volume   Culture   Final    NO GROWTH 4 DAYS Performed at [facility], [address] [place], [place] [address]    Report Status PENDING  Incomplete  CULTURE, BLOOD (ROUTINE X 2) w Reflex to ID Panel     Status: None (Preliminary result)   Collection Time: 07/31/20  1:18 PM   Specimen: BLOOD  Result Value Ref Range Status   Specimen Description BLOOD RIGHT ANTECUBITAL  Final   Special Requests   Final    BOTTLES DRAWN AEROBIC ONLY Blood Culture results may not be optimal due to an inadequate volume of blood received in culture bottles   Culture   Final    NO GROWTH 4 DAYS Performed at [facility], [address], [place], [place] [address]    Report Status PENDING  Incomplete    Please note: You were cared for by a hospitalist during your hospital stay. Once you are discharged, your primary care physician will handle any further medical issues. Please note that NO REFILLS for any discharge medications will be authorized once you are discharged, as it is imperative that you return to your primary care physician (or establish a relationship with a primary care physician if you do not have one) for your post hospital discharge needs so that they can reassess your need for medications and monitor your lab values.    Time coordinating discharge: 40 minutes  SIGNED:   Shelly Coss, MD  [facility] 08/03/2020, 2:49 PM Pager [phone]  If 7PM-7AM, please contact night-coverage [email or web address] Password [email or web address]

## 2020-08-22 DEATH — deceased

## 2020-09-13 ENCOUNTER — Emergency Department
Admission: EM | Admit: 2020-09-13 | Discharge: 2020-09-14 | Disposition: A | Discharge status: Home / Self Care | Payer: Medicare Other | Attending: Emergency Medicine | Admitting: Emergency Medicine

## 2020-09-13 ENCOUNTER — Other Ambulatory Visit: Payer: Self-pay

## 2020-09-13 ENCOUNTER — Encounter: Payer: Self-pay | Admitting: Emergency Medicine

## 2020-09-13 ENCOUNTER — Emergency Department: Payer: Medicare Other

## 2020-09-13 DIAGNOSIS — Z79899 Other long term (current) drug therapy: Secondary | ICD-10-CM | POA: Insufficient documentation

## 2020-09-13 DIAGNOSIS — R072 Precordial pain: Secondary | ICD-10-CM | POA: Insufficient documentation

## 2020-09-13 DIAGNOSIS — Z8546 Personal history of malignant neoplasm of prostate: Secondary | ICD-10-CM | POA: Insufficient documentation

## 2020-09-13 DIAGNOSIS — I130 Hypertensive heart and chronic kidney disease with heart failure and stage 1 through stage 4 chronic kidney disease, or unspecified chronic kidney disease: Secondary | ICD-10-CM | POA: Insufficient documentation

## 2020-09-13 DIAGNOSIS — Z7901 Long term (current) use of anticoagulants: Secondary | ICD-10-CM | POA: Diagnosis not present

## 2020-09-13 DIAGNOSIS — Z91040 Latex allergy status: Secondary | ICD-10-CM | POA: Diagnosis not present

## 2020-09-13 DIAGNOSIS — Z8616 Personal history of COVID-19: Secondary | ICD-10-CM | POA: Diagnosis not present

## 2020-09-13 DIAGNOSIS — I2510 Atherosclerotic heart disease of native coronary artery without angina pectoris: Secondary | ICD-10-CM | POA: Diagnosis not present

## 2020-09-13 DIAGNOSIS — I480 Paroxysmal atrial fibrillation: Secondary | ICD-10-CM | POA: Insufficient documentation

## 2020-09-13 DIAGNOSIS — N1831 Chronic kidney disease, stage 3a: Secondary | ICD-10-CM | POA: Insufficient documentation

## 2020-09-13 DIAGNOSIS — E1122 Type 2 diabetes mellitus with diabetic chronic kidney disease: Secondary | ICD-10-CM | POA: Diagnosis not present

## 2020-09-13 DIAGNOSIS — Z794 Long term (current) use of insulin: Secondary | ICD-10-CM | POA: Insufficient documentation

## 2020-09-13 DIAGNOSIS — Z87891 Personal history of nicotine dependence: Secondary | ICD-10-CM | POA: Diagnosis not present

## 2020-09-13 DIAGNOSIS — Z7984 Long term (current) use of oral hypoglycemic drugs: Secondary | ICD-10-CM | POA: Diagnosis not present

## 2020-09-13 DIAGNOSIS — I5021 Acute systolic (congestive) heart failure: Secondary | ICD-10-CM | POA: Insufficient documentation

## 2020-09-13 DIAGNOSIS — R079 Chest pain, unspecified: Secondary | ICD-10-CM

## 2020-09-13 LAB — BASIC METABOLIC PANEL
Anion gap: 9 (ref 5–15)
BUN: 37 mg/dL — ABNORMAL HIGH (ref 8–23)
CO2: 26 mmol/L (ref 22–32)
Calcium: 8.8 mg/dL — ABNORMAL LOW (ref 8.9–10.3)
Chloride: 100 mmol/L (ref 98–111)
Creatinine, Ser: 1.34 mg/dL — ABNORMAL HIGH (ref 0.61–1.24)
GFR, Estimated: 53 mL/min — ABNORMAL LOW (ref >60)
Glucose, Bld: 329 mg/dL — ABNORMAL HIGH (ref 70–99)
Potassium: 3.9 mmol/L (ref 3.5–5.1)
Sodium: 135 mmol/L (ref 135–145)

## 2020-09-13 LAB — HEPATIC FUNCTION PANEL
ALT: 21 U/L (ref 0–44)
AST: 28 U/L (ref 15–41)
Albumin: 3.3 g/dL — ABNORMAL LOW (ref 3.5–5.0)
Alkaline Phosphatase: 101 U/L (ref 38–126)
Bilirubin, Direct: 0.1 mg/dL (ref 0.0–0.2)
Indirect Bilirubin: 0.7 mg/dL (ref 0.3–0.9)
Total Bilirubin: 0.8 mg/dL (ref 0.3–1.2)
Total Protein: 6.5 g/dL (ref 6.5–8.1)

## 2020-09-13 LAB — CBC
HCT: 37.6 % — ABNORMAL LOW (ref 39.0–52.0)
Hemoglobin: 13.3 g/dL (ref 13.0–17.0)
MCH: 30.3 pg (ref 26.0–34.0)
MCHC: 35.4 g/dL (ref 30.0–36.0)
MCV: 85.6 fL (ref 80.0–100.0)
Platelets: 222 10*3/uL (ref 150–400)
RBC: 4.39 MIL/uL (ref 4.22–5.81)
RDW: 14 % (ref 11.5–15.5)
WBC: 8.9 10*3/uL (ref 4.0–10.5)
nRBC: 0 % (ref 0.0–0.2)

## 2020-09-13 LAB — TROPONIN I (HIGH SENSITIVITY): Troponin I (High Sensitivity): 40 ng/L — ABNORMAL HIGH (ref <18)

## 2020-09-13 LAB — LIPASE, BLOOD: Lipase: 32 U/L (ref 11–51)

## 2020-09-13 NOTE — ED Provider Notes (Signed)
-----------------------------------------   11:25 PM on 09/13/2020 -----------------------------------------  Assuming care from Dr. Jari Pigg.  In short, [name] is a 86 y.o. male with a chief complaint of chest pain.  Refer to the original H&P for additional details.  The current plan of care is to discuss with the patient whether or not he should stay in the hospital after the second troponin has come back.   ----------------------------------------- 2:19 AM on 09/14/2020 -----------------------------------------  The patient's second troponin was 44 and his original was 40.  I updated him about the result.  He said that he has had no additional chest pain for a long time.  He would rather go home than stay in the hospital.  We had an extensive and lengthy discussion, as did Dr. Jari Pigg, about the risks of going home versus the benefits of staying.  He understands that we cannot definitively rule out a heart attack but given that he is comfortable, not having active chest pain, and he had only a minimal change in his troponin (delta of 4) and he has a chronically slightly elevated troponin, I think it is reasonable for him to follow-up as an outpatient in about 24 hours.  I sent a message via [facility] to his cardiologist, Dr. Nehemiah Massed to make him aware of the ED visit.  I gave strict return precautions to the patient.  He understands and agrees with the plan for close follow-up and return to the ED with worsening symptoms.   Hinda Kehr, MD 09/14/20 [phone]

## 2020-09-13 NOTE — ED Triage Notes (Signed)
Pt presents from home via EMS with complaints of chest pain since this morning, per EMS pt woke up this morning with Chest pain, pt took his nitro pain went away reprots this evening after dinner he had chest pain and took 2 Nitro prior to arrival, pt denies any pain at present or any other symptom. Pt Took 324mg  aspirin prior to arrival. Pt is awake, alert and oriented  No distress noted

## 2020-09-13 NOTE — ED Provider Notes (Signed)
[facility] Emergency Department Provider Note  ____________________________________________   Event Date/Time   First MD Initiated Contact with Patient 09/13/20 2125     (approximate)  I have reviewed the triage vital signs and the nursing notes.   HISTORY  Chief Complaint Chest Pain    HPI [name] is a 86 y.o. male with atrial fibrillation, CHF, CKD, diabetes who comes in for chest pain.  Patient woke up with chest pain this morning.  Took his nitro pain went away.  However this evening after dinner he had chest pain.  Patient states he has had chest pain today 3 times.  Each time the pain is in the midsternal area, constant, lasting few minutes, goes away with nitro, pressure sensation.  He states one of the episodes happened this morning but then the other episodes happened after eating dinner.  Denies any pain in his abdomen at all.  Denies ever having any stents put in.  Patient is followed by Dr. Nehemiah Massed from cardiology.          Past Medical History:  Diagnosis Date  . Arthritis   . Atrial fibrillation ([place])   . Carcinoma of prostate ([place])   . CHF (congestive heart failure) ([place])   . Chronic kidney disease   . Diabetes mellitus without complication ([place])   . ED (erectile dysfunction)   . Frequent urination   . Hyperlipidemia   . Hypertension   . Moderate mitral insufficiency   . Peripheral vascular disease ([place])   . Pneumonia 04/2016  . Prostate cancer ([place])   . Sleep apnea    OSA--USE C-PAP  . Ulcer of left foot due to type 2 diabetes mellitus ([place])   . Urinary stress incontinence, male     Patient Active Problem List   Diagnosis Date Noted  . Acute encephalopathy 07/29/2020  . Lactic acidosis 07/28/2020  . Hypoxic 07/28/2020  . Chronic prescription opiate use 07/28/2020  . Elevated troponin 07/28/2020  . Chronic anticoagulation 06/25/2020  . Chronic kidney disease, stage 3a ([place]) 06/25/2020  . AMS (altered  mental status) 04/08/2020  . CAD (coronary artery disease) 03/06/2020  . Personal history of COVID-19 03/06/2020  . Acute toxic-metabolic encephalopathy 18/29/9371  . UTI (urinary tract infection) 03/06/2020  . (HFpEF) heart failure with preserved ejection fraction ([place]) 03/06/2020  . Goals of care, counseling/discussion   . Palliative care by specialist   . Seizure disorder ([place]) 11/30/2019  . ACS (acute coronary syndrome) ([place]) 06/24/2019  . Sinus bradycardia on ECG 05/16/2019  . Paroxysmal A-fib ([place]) 03/27/2019  . Hx of BKA, right ([place]) 03/02/2019  . Severe protein-calorie malnutrition ([place]) 02/16/2019  . Acute systolic CHF (congestive heart failure) ([place]) 02/16/2019  . Acute respiratory failure with hypoxia ([place]) 02/08/2019  . Pneumonia due to COVID-19 virus 02/08/2019  . Below-knee amputation of right lower extremity ([place]) 01/15/2019  . Polyp of descending colon   . Iron deficiency anemia due to chronic blood loss   . Sacral decubitus ulcer, stage II ([place]) 12/26/2018  . Acute respiratory failure ([place]) 12/20/2018  . Femur fracture ([place]) 10/13/2018  . Chest pain 06/03/2018  . NSTEMI (non-ST elevated myocardial infarction) ([place]) 01/16/2018  . Acute CHF (congestive heart failure) ([place]) 11/23/2017  . Atrial fibrillation with rapid ventricular response ([place]) 10/15/2017  . BKA stump complication ([place]) 69/67/8938  . 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 ([place]) 02/10/2017  . S/P bilateral BKA (below knee amputation) ([place]) 02/01/2017  . Chronic diastolic heart failure (  HCC) 01/21/2017  . Pressure injury of skin 01/18/2017  . Atherosclerosis of artery of extremity with gangrene (HCC) 01/05/2017  . Diabetic foot ulcer (HCC) 12/29/2016  . Ataxia 10/21/2016  . Atherosclerosis of native arteries of the extremities with ulceration (HCC) 10/12/2016  . History of femoral angiogram 09/06/2016  . Left leg pain 09/06/2016  . Leukocytosis 09/06/2016  . Generalized weakness  09/06/2016  . AKI (acute kidney injury) (HCC) 09/06/2016  . Atrial fibrillation, chronic (HCC) 08/30/2016  . Pneumonia 05/19/2016  . Hyperlipidemia 04/20/2016  . Back pain 04/19/2016  . Type 2 diabetes mellitus treated with insulin (HCC) 04/19/2016  . Essential hypertension 04/19/2016  . PAD (peripheral artery disease) (HCC) 04/19/2016  . Erectile dysfunction following radical prostatectomy 06/25/2015  . History of prostate cancer 12/23/2014  . Incontinence 12/23/2014    Past Surgical History:  Procedure Laterality Date  . AMPUTATION Left 01/12/2017   Procedure: AMPUTATION BELOW KNEE;  Surgeon: Annice Needy, MD;  Location: [facility] ORS;  Service: General;  Laterality: Left;  . AMPUTATION Right 12/27/2018   Procedure: AMPUTATION BELOW KNEE;  Surgeon: Annice Needy, MD;  Location: [facility] ORS;  Service: General;  Laterality: Right;  . APLIGRAFT PLACEMENT Left 10/15/2016   Procedure: APLIGRAFT PLACEMENT;  Surgeon: Recardo Evangelist, DPM;  Location: [facility] ORS;  Service: Podiatry;  Laterality: Left;  necrotic ulcer  . CHOLECYSTECTOMY    . COLONOSCOPY WITH PROPOFOL N/A 01/02/2019   Procedure: COLONOSCOPY WITH PROPOFOL;  Surgeon: Midge Minium, MD;  Location: [facility] ENDOSCOPY;  Service: Endoscopy;  Laterality: N/A;  . COLONOSCOPY WITH PROPOFOL N/A 01/05/2019   Procedure: COLONOSCOPY WITH PROPOFOL;  Surgeon: Midge Minium, MD;  Location: [facility] ENDOSCOPY;  Service: Endoscopy;  Laterality: N/A;  . ESOPHAGOGASTRODUODENOSCOPY (EGD) WITH PROPOFOL N/A 01/02/2019   Procedure: ESOPHAGOGASTRODUODENOSCOPY (EGD) WITH PROPOFOL;  Surgeon: Midge Minium, MD;  Location: [facility] ENDOSCOPY;  Service: Endoscopy;  Laterality: N/A;  . EYE SURGERY Bilateral    Cataract Extraction with IOL  . HIP ARTHROPLASTY Right 10/13/2018   Procedure: ARTHROPLASTY BIPOLAR HIP (HEMIARTHROPLASTY);  Surgeon: Deeann Saint, MD;  Location: [facility] ORS;  Service: Orthopedics;  Laterality: Right;  . IRRIGATION AND DEBRIDEMENT FOOT Left 10/15/2016   Procedure:  IRRIGATION AND DEBRIDEMENT FOOT-EXCISIONAL DEBRIDEMENT OF SKIN 3RD, 4TH AND 5TH TOES WITH APPLICATION OF APLIGRAFT;  Surgeon: Recardo Evangelist, DPM;  Location: [facility] ORS;  Service: Podiatry;  Laterality: Left;  necrotic,gangrene  . IRRIGATION AND DEBRIDEMENT FOOT Left 12/09/2016   Procedure: IRRIGATION AND DEBRIDEMENT FOOT-MUSCLE/FASCIA, RESECTION OF FOURTH AND FIFTH METATARSAL NECROTIC BONE;  Surgeon: Recardo Evangelist, DPM;  Location: [facility] ORS;  Service: Podiatry;  Laterality: Left;  . IRRIGATION AND DEBRIDEMENT FOOT Right 12/23/2018   Procedure: IRRIGATION AND DEBRIDEMENT FOOT and wound vac application;  Surgeon: Gwyneth Revels, DPM;  Location: [facility] ORS;  Service: Podiatry;  Laterality: Right;  . LEFT HEART CATH N/A 06/25/2019   Procedure: Left Heart Cath and Coronary Angiography;  Surgeon: Marcina Millard, MD;  Location: [facility] INVASIVE CV LAB;  Service: Cardiovascular;  Laterality: N/A;  . LEFT HEART CATH AND CORONARY ANGIOGRAPHY N/A 06/05/2018   Procedure: LEFT HEART CATH AND CORONARY ANGIOGRAPHY;  Surgeon: Alwyn Pea, MD;  Location: [facility] INVASIVE CV LAB;  Service: Cardiovascular;  Laterality: N/A;  . LOWER EXTREMITY ANGIOGRAPHY Left 08/16/2016   Procedure: Lower Extremity Angiography;  Surgeon: Annice Needy, MD;  Location: [facility] INVASIVE CV LAB;  Service: Cardiovascular;  Laterality: Left;  . LOWER EXTREMITY ANGIOGRAPHY Left 09/01/2016   Procedure: Lower Extremity Angiography;  Surgeon: Annice Needy, MD;  Location: [place] CV LAB;  Service: Cardiovascular;  Laterality: Left;  . LOWER EXTREMITY ANGIOGRAPHY Left 10/14/2016   Procedure: Lower Extremity Angiography;  Surgeon: Algernon Huxley, MD;  Location: [place] CV LAB;  Service: Cardiovascular;  Laterality: Left;  . LOWER EXTREMITY ANGIOGRAPHY Left 12/20/2016   Procedure: Lower Extremity Angiography;  Surgeon: Algernon Huxley, MD;  Location: [place] CV LAB;  Service: Cardiovascular;  Laterality: Left;  . LOWER EXTREMITY ANGIOGRAPHY  Right 12/18/2018   Procedure: LOWER EXTREMITY ANGIOGRAPHY;  Surgeon: Algernon Huxley, MD;  Location: [place] CV LAB;  Service: Cardiovascular;  Laterality: Right;  . LOWER EXTREMITY INTERVENTION  09/01/2016   Procedure: Lower Extremity Intervention;  Surgeon: Algernon Huxley, MD;  Location: [place] CV LAB;  Service: Cardiovascular;;  . PROSTATE SURGERY     removal  . TONSILLECTOMY     as a child    Prior to Admission medications   Medication Sig Start Date End Date Taking? Authorizing Provider  atorvastatin (LIPITOR) 80 MG tablet Take 80 mg by mouth at bedtime.    [provider]  bisacodyl (DULCOLAX) 5 MG EC tablet Take 1 tablet (5 mg total) by mouth daily as needed for moderate constipation. 04/10/20   Lorella Nimrod, MD  chlorthalidone (HYGROTON) 25 MG tablet Take 25 mg by mouth daily.    [provider]  diltiazem (CARDIZEM CD) 240 MG 24 hr capsule Take 1 capsule (240 mg total) by mouth daily. 08/05/20   Shelly Coss, MD  divalproex (DEPAKOTE) 500 MG DR tablet Take 500 mg by mouth daily.  02/18/20   [provider]  Ensure Max Protein (ENSURE MAX PROTEIN) LIQD Take 330 mLs (11 oz total) by mouth 2 (two) times daily. 04/10/20   Lorella Nimrod, MD  ferrous sulfate 325 (65 FE) MG tablet Take 325 mg by mouth daily with breakfast.    [provider]  gabapentin (NEURONTIN) 100 MG capsule Take 1 capsule (100 mg total) by mouth 3 (three) times daily. Aug 28, 2020   Shelly Coss, MD  Insulin Glargine (BASAGLAR KWIKPEN) 100 UNIT/ML Inject 20 Units into the skin at bedtime. 08/28/20   Shelly Coss, MD  isosorbide mononitrate (IMDUR) 30 MG 24 hr tablet Take 60 mg by mouth daily. 11/07/19   [provider]  metFORMIN (GLUCOPHAGE) 500 MG tablet Take 500 mg by mouth 2 (two) times daily with a meal.    [provider]  metoprolol succinate (TOPROL-XL) 25 MG 24 hr tablet Take 1 tablet (25 mg total) by mouth daily. Take with or immediately following a  meal. 04/10/20   Lorella Nimrod, MD  NOVOLOG FLEXPEN 100 UNIT/ML FlexPen Inject 4 Units into the skin 3 (three) times daily with meals. 04/08/20   [provider]  pantoprazole (PROTONIX) 40 MG tablet Take 1 tablet (40 mg total) by mouth at bedtime for 14 days. 2020-08-28 08/18/20  Shelly Coss, MD  Rivaroxaban (XARELTO) 15 MG TABS tablet Take 1 tablet (15 mg total) by mouth daily with supper. Hold Xarelto for 3 days.  Start on January 09, 2019. 01/09/19   [name], MD    Allergies Contrast media [iodinated diagnostic agents], Iohexol, Metrizamide, and Latex  Family History  Problem Relation Age of Onset  . Hypertension Mother   . Diabetes Mother   . Prostate cancer Neg Hx   . Bladder Cancer Neg Hx   . Kidney disease Neg Hx     Social History Social History   Tobacco Use  . Smoking status:  Former Smoker    Packs/day: 0.25    Types: Cigarettes    Quit date: 10/14/1994    Years since quitting: 25.9  . Smokeless tobacco: Never Used  Vaping Use  . Vaping Use: Never used  Substance Use Topics  . Alcohol use: No    Alcohol/week: 0.0 standard drinks  . Drug use: No      Review of Systems Constitutional: No fever/chills Eyes: No visual changes. ENT: No sore throat. Cardiovascular: Positive chest pain Respiratory: Denies shortness of breath. Gastrointestinal: No abdominal pain.  No nausea, no vomiting.  No diarrhea.  No constipation. Genitourinary: Negative for dysuria. Musculoskeletal: Negative for back pain. Skin: Negative for rash. Neurological: Negative for headaches, focal weakness or numbness. All other ROS negative ____________________________________________   PHYSICAL EXAM:  VITAL SIGNS: ED Triage Vitals  Enc Vitals Group     BP 09/13/20 2137 137/86     Pulse Rate 09/13/20 2137 76     Resp 09/13/20 2137 (!) 21     Temp 09/13/20 2136 99.2 F (37.3 C)     Temp Source 09/13/20 2136 Oral     SpO2 09/13/20 2136 94 %     Weight 09/13/20 2137 180 lb  (81.6 kg)     Height 09/13/20 2137 5\' 7"  (1.702 m)     Head Circumference --      Peak Flow --      Pain Score 09/13/20 2137 0     Pain Loc --      Pain Edu? --      Excl. in GC? --     Constitutional: Alert and oriented. Well appearing and in no acute distress. Eyes: Conjunctivae are normal. EOMI. Head: Atraumatic. Nose: No congestion/rhinnorhea. Mouth/Throat: Mucous membranes are moist.   Neck: No stridor. Trachea Midline. FROM Cardiovascular: Normal rate, regular rhythm. Grossly normal heart sounds.  Good peripheral circulation. Respiratory: Normal respiratory effort.  No retractions. Lungs CTAB. Gastrointestinal: Soft and nontender. No distention. No abdominal bruits.  Musculoskeletal: Bilateral leg amputations. Neurologic:  Normal speech and language. No gross focal neurologic deficits are appreciated.  Skin:  Skin is warm, dry and intact. No rash noted. Psychiatric: Mood and affect are normal. Speech and behavior are normal. GU: Deferred   ____________________________________________   LABS (all labs ordered are listed, but only abnormal results are displayed)  Labs Reviewed  BASIC METABOLIC PANEL - Abnormal; Notable for the following components:      Result Value   Glucose, Bld 329 (*)    BUN 37 (*)    Creatinine, Ser 1.34 (*)    Calcium 8.8 (*)    GFR, Estimated 53 (*)    All other components within normal limits  CBC - Abnormal; Notable for the following components:   HCT 37.6 (*)    All other components within normal limits  HEPATIC FUNCTION PANEL - Abnormal; Notable for the following components:   Albumin 3.3 (*)    All other components within normal limits  TROPONIN I (HIGH SENSITIVITY) - Abnormal; Notable for the following components:   Troponin I (High Sensitivity) 40 (*)    All other components within normal limits  LIPASE, BLOOD  TROPONIN I (HIGH SENSITIVITY)   ____________________________________________   ED ECG REPORT I, Concha Se, the  attending physician, personally viewed and interpreted this ECG.  EKG is atrial fibrillation rate of 78 with some minimal ST depression in lead II and aVF, no ST elevation, normal intervals.  I did review patient's prior EKG back  in March and he did have some similar ST depression.  There is also some similar very mild aVR elevation. ____________________________________________  [place], personally viewed and evaluated these images (plain radiographs) as part of my medical decision making, as well as reviewing the written report by the radiologist.  ED MD interpretation:  No PNA   Official radiology report(s): DG Chest 2 View  Result Date: 09/13/2020 CLINICAL DATA:  Chest pain. EXAM: CHEST - 2 VIEW COMPARISON:  07/28/2020 FINDINGS: Unchanged cardiomegaly. Aortic atherosclerosis and tortuosity. Mild chronic elevation of right hemidiaphragm. Linear atelectasis or scarring at the left lung base. No pulmonary edema. Mild hyperinflation with blunting of costophrenic angles. No significant effusion. No pneumothorax. No acute osseous abnormalities are seen. IMPRESSION: 1. Stable cardiomegaly. Aortic atherosclerosis and tortuosity. 2.  No acute pulmonary process. Electronically Signed   By: Keith Rake M.D.   On: 09/13/2020 22:19    ____________________________________________   PROCEDURES  Procedure(s) performed (including Critical Care):  .1-3 Lead EKG Interpretation Performed by: Vanessa [place], MD Authorized by: Vanessa [place], MD     Interpretation: abnormal     ECG rate:  60s   ECG rate assessment: normal     Rhythm: atrial fibrillation     Ectopy: none     Conduction: normal       ____________________________________________   INITIAL IMPRESSION / ASSESSMENT AND PLAN / ED COURSE   Tyjai Armenia was evaluated in Emergency Department on 09/13/2020 for the symptoms described in the history of present illness. He was evaluated in the context of the  global COVID-19 pandemic, which necessitated consideration that the patient might be at risk for infection with the SARS-CoV-2 virus that causes COVID-19. Institutional protocols and algorithms that pertain to the evaluation of patients at risk for COVID-19 are in a state of rapid change based on information released by regulatory bodies including the CDC and federal and state organizations. These policies and algorithms were followed during the patient's care in the ED.    Most Likely DDx:  -Patient comes in with chest pain that gets better with nitro.  Is concerning for stable angina versus ACS.  Will get cardiac markers and EKG and keep patient the cardiac monitor.  Patient does have a little bit of EKG changes with a little bit of ST depressions but I reviewed his prior EKGs and it looks similar  DDx that was also considered d/t potential to cause harm, but was found less likely based on history and physical (as detailed above): -PNA (no fevers, cough but CXR to evaluate) -PNX (reassured with equal b/l breath sounds, CXR to evaluate) -Symptomatic anemia (will get H&H) -Pulmonary embolism as no sob at rest, not pleuritic in nature, no hypoxia -Aortic Dissection as no tearing pain and no radiation to the mid back, pulses equal -Pericarditis no rub on exam, EKG changes or hx to suggest dx -Tamponade (no notable SOB, tachycardic, hypotensive) -Esophageal rupture (no h/o diffuse vomitting/no crepitus)  To note patient did have recent echocardiogram.  Patient denies having to use nitro recently prior to today.  Discussed with patient my concern that he is needed to use nitro 3 times and that even if his cardiac markers are negative I would recommend admission for high risk chest pain.  Patient is adamant that he does not want to stay in the hospital unless he absolutely needs to.  He understands that this could be that he could have a future heart attack that could  be worse even if cardiac markers  stable  Pt is chest pain free for three hours in ED. Pt handed off to oncoming team pending repeat trop and rediscussion for admission vs close f/u with cards. .     ____________________________________________   FINAL CLINICAL IMPRESSION(S) / ED DIAGNOSES   Final diagnoses:  Chest pain, unspecified type     MEDICATIONS GIVEN DURING THIS VISIT:  Medications - No data to display   ED Discharge Orders    None       Note:  This document was prepared using Dragon voice recognition software and may include unintentional dictation errors.   Vanessa [place], MD 09/14/20 Lupita Shutter

## 2020-09-14 LAB — TROPONIN I (HIGH SENSITIVITY): Troponin I (High Sensitivity): 44 ng/L — ABNORMAL HIGH (ref <18)

## 2020-09-14 NOTE — ED Notes (Signed)
Pt signed discharge paper copy did not work Economist in room

## 2020-09-14 NOTE — Discharge Instructions (Addendum)

## 2020-09-14 NOTE — ED Notes (Signed)
This Rn called pt's son [name] to inform pt needs a ride to go home [phone])

## 2020-09-19 DIAGNOSIS — R7989 Other specified abnormal findings of blood chemistry: Secondary | ICD-10-CM | POA: Insufficient documentation

## 2020-09-19 DIAGNOSIS — R778 Other specified abnormalities of plasma proteins: Secondary | ICD-10-CM | POA: Insufficient documentation

## 2020-10-24 ENCOUNTER — Emergency Department: Payer: Medicare Other

## 2020-10-24 ENCOUNTER — Other Ambulatory Visit: Payer: Self-pay

## 2020-10-24 ENCOUNTER — Observation Stay
Admission: EM | Admit: 2020-10-24 | Discharge: 2020-10-25 | Disposition: A | Discharge status: Home / Self Care | Payer: Medicare Other | Attending: Internal Medicine | Admitting: Internal Medicine

## 2020-10-24 ENCOUNTER — Observation Stay: Payer: Medicare Other

## 2020-10-24 DIAGNOSIS — R739 Hyperglycemia, unspecified: Secondary | ICD-10-CM

## 2020-10-24 DIAGNOSIS — Z7984 Long term (current) use of oral hypoglycemic drugs: Secondary | ICD-10-CM | POA: Insufficient documentation

## 2020-10-24 DIAGNOSIS — Z87891 Personal history of nicotine dependence: Secondary | ICD-10-CM | POA: Diagnosis not present

## 2020-10-24 DIAGNOSIS — I25110 Atherosclerotic heart disease of native coronary artery with unstable angina pectoris: Principal | ICD-10-CM | POA: Insufficient documentation

## 2020-10-24 DIAGNOSIS — K219 Gastro-esophageal reflux disease without esophagitis: Secondary | ICD-10-CM

## 2020-10-24 DIAGNOSIS — Z20822 Contact with and (suspected) exposure to covid-19: Secondary | ICD-10-CM | POA: Insufficient documentation

## 2020-10-24 DIAGNOSIS — I5021 Acute systolic (congestive) heart failure: Secondary | ICD-10-CM | POA: Diagnosis not present

## 2020-10-24 DIAGNOSIS — Z7901 Long term (current) use of anticoagulants: Secondary | ICD-10-CM | POA: Insufficient documentation

## 2020-10-24 DIAGNOSIS — I130 Hypertensive heart and chronic kidney disease with heart failure and stage 1 through stage 4 chronic kidney disease, or unspecified chronic kidney disease: Secondary | ICD-10-CM | POA: Diagnosis not present

## 2020-10-24 DIAGNOSIS — E1165 Type 2 diabetes mellitus with hyperglycemia: Secondary | ICD-10-CM | POA: Diagnosis not present

## 2020-10-24 DIAGNOSIS — I214 Non-ST elevation (NSTEMI) myocardial infarction: Secondary | ICD-10-CM

## 2020-10-24 DIAGNOSIS — R079 Chest pain, unspecified: Secondary | ICD-10-CM | POA: Diagnosis present

## 2020-10-24 DIAGNOSIS — Z91040 Latex allergy status: Secondary | ICD-10-CM | POA: Diagnosis not present

## 2020-10-24 DIAGNOSIS — I10 Essential (primary) hypertension: Secondary | ICD-10-CM

## 2020-10-24 DIAGNOSIS — Z8546 Personal history of malignant neoplasm of prostate: Secondary | ICD-10-CM | POA: Insufficient documentation

## 2020-10-24 DIAGNOSIS — I4891 Unspecified atrial fibrillation: Secondary | ICD-10-CM | POA: Diagnosis not present

## 2020-10-24 DIAGNOSIS — Z8616 Personal history of COVID-19: Secondary | ICD-10-CM | POA: Insufficient documentation

## 2020-10-24 DIAGNOSIS — Z79899 Other long term (current) drug therapy: Secondary | ICD-10-CM | POA: Diagnosis not present

## 2020-10-24 DIAGNOSIS — E1142 Type 2 diabetes mellitus with diabetic polyneuropathy: Secondary | ICD-10-CM | POA: Diagnosis not present

## 2020-10-24 DIAGNOSIS — E118 Type 2 diabetes mellitus with unspecified complications: Secondary | ICD-10-CM

## 2020-10-24 DIAGNOSIS — J9601 Acute respiratory failure with hypoxia: Secondary | ICD-10-CM

## 2020-10-24 DIAGNOSIS — Z794 Long term (current) use of insulin: Secondary | ICD-10-CM | POA: Diagnosis not present

## 2020-10-24 DIAGNOSIS — N1831 Chronic kidney disease, stage 3a: Secondary | ICD-10-CM | POA: Insufficient documentation

## 2020-10-24 DIAGNOSIS — I480 Paroxysmal atrial fibrillation: Secondary | ICD-10-CM

## 2020-10-24 LAB — PROTIME-INR
INR: 1.2 (ref 0.8–1.2)
Prothrombin Time: 15.3 seconds — ABNORMAL HIGH (ref 11.4–15.2)

## 2020-10-24 LAB — COMPREHENSIVE METABOLIC PANEL
ALT: 21 U/L (ref 0–44)
AST: 21 U/L (ref 15–41)
Albumin: 3.5 g/dL (ref 3.5–5.0)
Alkaline Phosphatase: 132 U/L — ABNORMAL HIGH (ref 38–126)
Anion gap: 12 (ref 5–15)
BUN: 37 mg/dL — ABNORMAL HIGH (ref 8–23)
CO2: 24 mmol/L (ref 22–32)
Calcium: 9 mg/dL (ref 8.9–10.3)
Chloride: 97 mmol/L — ABNORMAL LOW (ref 98–111)
Creatinine, Ser: 1.43 mg/dL — ABNORMAL HIGH (ref 0.61–1.24)
GFR, Estimated: 49 mL/min — ABNORMAL LOW (ref >60)
Glucose, Bld: 501 mg/dL (ref 70–99)
Potassium: 4.2 mmol/L (ref 3.5–5.1)
Sodium: 133 mmol/L — ABNORMAL LOW (ref 135–145)
Total Bilirubin: 0.5 mg/dL (ref 0.3–1.2)
Total Protein: 6.9 g/dL (ref 6.5–8.1)

## 2020-10-24 LAB — BASIC METABOLIC PANEL
Anion gap: 9 (ref 5–15)
BUN: 34 mg/dL — ABNORMAL HIGH (ref 8–23)
CO2: 26 mmol/L (ref 22–32)
Calcium: 9 mg/dL (ref 8.9–10.3)
Chloride: 101 mmol/L (ref 98–111)
Creatinine, Ser: 1.35 mg/dL — ABNORMAL HIGH (ref 0.61–1.24)
GFR, Estimated: 52 mL/min — ABNORMAL LOW (ref >60)
Glucose, Bld: 364 mg/dL — ABNORMAL HIGH (ref 70–99)
Potassium: 4 mmol/L (ref 3.5–5.1)
Sodium: 136 mmol/L (ref 135–145)

## 2020-10-24 LAB — CBC WITH DIFFERENTIAL/PLATELET
Abs Immature Granulocytes: 0.04 10*3/uL (ref 0.00–0.07)
Basophils Absolute: 0.1 10*3/uL (ref 0.0–0.1)
Basophils Relative: 1 %
Eosinophils Absolute: 0.3 10*3/uL (ref 0.0–0.5)
Eosinophils Relative: 3 %
HCT: 39.2 % (ref 39.0–52.0)
Hemoglobin: 13.4 g/dL (ref 13.0–17.0)
Immature Granulocytes: 0 %
Lymphocytes Relative: 11 %
Lymphs Abs: 1.1 10*3/uL (ref 0.7–4.0)
MCH: 30 pg (ref 26.0–34.0)
MCHC: 34.2 g/dL (ref 30.0–36.0)
MCV: 87.7 fL (ref 80.0–100.0)
Monocytes Absolute: 0.8 10*3/uL (ref 0.1–1.0)
Monocytes Relative: 8 %
Neutro Abs: 7.6 10*3/uL (ref 1.7–7.7)
Neutrophils Relative %: 77 %
Platelets: 202 10*3/uL (ref 150–400)
RBC: 4.47 MIL/uL (ref 4.22–5.81)
RDW: 13.2 % (ref 11.5–15.5)
WBC: 9.9 10*3/uL (ref 4.0–10.5)
nRBC: 0 % (ref 0.0–0.2)

## 2020-10-24 LAB — TROPONIN I (HIGH SENSITIVITY)
Troponin I (High Sensitivity): 95 ng/L — ABNORMAL HIGH (ref <18)
Troponin I (High Sensitivity): 435 ng/L (ref <18)

## 2020-10-24 LAB — RESP PANEL BY RT-PCR (FLU A&B, COVID) ARPGX2
Influenza A by PCR: NEGATIVE
Influenza B by PCR: NEGATIVE
SARS Coronavirus 2 by RT PCR: NEGATIVE

## 2020-10-24 LAB — D-DIMER, QUANTITATIVE: D-Dimer, Quant: 0.76 ug/mL-FEU — ABNORMAL HIGH (ref 0.00–0.50)

## 2020-10-24 LAB — GLUCOSE, CAPILLARY
Glucose-Capillary: 371 mg/dL — ABNORMAL HIGH (ref 70–99)
Glucose-Capillary: 307 mg/dL — ABNORMAL HIGH (ref 70–99)
Glucose-Capillary: 289 mg/dL — ABNORMAL HIGH (ref 70–99)
Glucose-Capillary: 327 mg/dL — ABNORMAL HIGH (ref 70–99)
Glucose-Capillary: 173 mg/dL — ABNORMAL HIGH (ref 70–99)
Glucose-Capillary: 267 mg/dL — ABNORMAL HIGH (ref 70–99)

## 2020-10-24 LAB — HEPARIN LEVEL (UNFRACTIONATED)
Heparin Unfractionated: 0.25 IU/mL — ABNORMAL LOW (ref 0.30–0.70)
Heparin Unfractionated: 0.19 IU/mL — ABNORMAL LOW (ref 0.30–0.70)
Heparin Unfractionated: 0.48 IU/mL (ref 0.30–0.70)

## 2020-10-24 LAB — HEMOGLOBIN A1C
Hgb A1c MFr Bld: 9.5 % — ABNORMAL HIGH (ref 4.8–5.6)
Mean Plasma Glucose: 226 mg/dL

## 2020-10-24 LAB — LIPASE, BLOOD: Lipase: 34 U/L (ref 11–51)

## 2020-10-24 LAB — APTT: aPTT: 31 seconds (ref 24–36)

## 2020-10-24 LAB — BRAIN NATRIURETIC PEPTIDE: B Natriuretic Peptide: 464.5 pg/mL — ABNORMAL HIGH (ref 0.0–100.0)

## 2020-10-24 MED ORDER — ONDANSETRON HCL 4 MG/2ML IJ SOLN: 4.0000 mg | Freq: Four times a day (QID) | INTRAMUSCULAR | Status: DC | PRN

## 2020-10-24 MED ORDER — ALPRAZOLAM 0.25 MG PO TABS: 0.2500 mg | ORAL_TABLET | Freq: Two times a day (BID) | ORAL | Status: DC | PRN

## 2020-10-24 MED ORDER — ASPIRIN EC 81 MG PO TBEC: 81.0000 mg | DELAYED_RELEASE_TABLET | Freq: Every day | ORAL | Status: DC

## 2020-10-24 MED ORDER — SODIUM CHLORIDE 0.9 % IV SOLN: INTRAVENOUS | Status: DC

## 2020-10-24 MED ORDER — DIVALPROEX SODIUM 500 MG PO DR TAB
500.0000 mg | DELAYED_RELEASE_TABLET | Freq: Every day | ORAL | Status: DC
  Administered 2020-10-24 – 2020-10-25 (×2): 500 mg via ORAL
  Filled 2020-10-24 (×2): qty 1

## 2020-10-24 MED ORDER — BISACODYL 5 MG PO TBEC: 5.0000 mg | DELAYED_RELEASE_TABLET | Freq: Every day | ORAL | Status: DC | PRN

## 2020-10-24 MED ORDER — ASPIRIN EC 325 MG PO TBEC
325.0000 mg | DELAYED_RELEASE_TABLET | Freq: Every day | ORAL | Status: DC
  Administered 2020-10-24 – 2020-10-25 (×2): 325 mg via ORAL
  Filled 2020-10-24 (×2): qty 1

## 2020-10-24 MED ORDER — GABAPENTIN 100 MG PO CAPS
100.0000 mg | ORAL_CAPSULE | Freq: Three times a day (TID) | ORAL | Status: DC
  Administered 2020-10-24 – 2020-10-25 (×3): 100 mg via ORAL
  Filled 2020-10-24 (×3): qty 1

## 2020-10-24 MED ORDER — ATORVASTATIN CALCIUM 80 MG PO TABS: 80.0000 mg | ORAL_TABLET | Freq: Every day | ORAL | Status: DC

## 2020-10-24 MED ORDER — METOPROLOL SUCCINATE ER 25 MG PO TB24
25.0000 mg | ORAL_TABLET | Freq: Every day | ORAL | Status: DC
  Administered 2020-10-24 – 2020-10-25 (×2): 25 mg via ORAL
  Filled 2020-10-24 (×2): qty 1

## 2020-10-24 MED ORDER — HEPARIN BOLUS VIA INFUSION
4000.0000 [IU] | Freq: Once | INTRAVENOUS | Status: AC
  Administered 2020-10-24: 4000 [IU] via INTRAVENOUS
  Filled 2020-10-24: qty 4000

## 2020-10-24 MED ORDER — INSULIN GLARGINE 100 UNIT/ML ~~LOC~~ SOLN
20.0000 [IU] | Freq: Every day | SUBCUTANEOUS | Status: DC
  Administered 2020-10-24: 20 [IU] via SUBCUTANEOUS
  Filled 2020-10-24 (×2): qty 0.2

## 2020-10-24 MED ORDER — LABETALOL HCL 5 MG/ML IV SOLN
20.0000 mg | INTRAVENOUS | Status: DC | PRN
  Administered 2020-10-24: 20 mg via INTRAVENOUS
  Filled 2020-10-24: qty 4

## 2020-10-24 MED ORDER — TECHNETIUM TO 99M ALBUMIN AGGREGATED
4.0900 | Freq: Once | INTRAVENOUS | Status: AC | PRN
  Administered 2020-10-24: 4.09 via INTRAVENOUS

## 2020-10-24 MED ORDER — INSULIN ASPART 100 UNIT/ML FLEXPEN: 4.0000 [IU] | PEN_INJECTOR | Freq: Three times a day (TID) | SUBCUTANEOUS | Status: DC

## 2020-10-24 MED ORDER — INSULIN GLARGINE 100 UNIT/ML ~~LOC~~ SOLN
25.0000 [IU] | Freq: Every day | SUBCUTANEOUS | Status: DC
  Administered 2020-10-25: 25 [IU] via SUBCUTANEOUS
  Filled 2020-10-24 (×2): qty 0.25

## 2020-10-24 MED ORDER — ACETAMINOPHEN 325 MG PO TABS: 650.0000 mg | ORAL_TABLET | ORAL | Status: DC | PRN

## 2020-10-24 MED ORDER — NITROGLYCERIN 0.4 MG SL SUBL: 0.4000 mg | SUBLINGUAL_TABLET | SUBLINGUAL | Status: DC | PRN

## 2020-10-24 MED ORDER — INSULIN GLARGINE 100 UNIT/ML ~~LOC~~ SOLN
20.0000 [IU] | Freq: Every day | SUBCUTANEOUS | Status: DC
  Filled 2020-10-24: qty 0.2

## 2020-10-24 MED ORDER — ENSURE MAX PROTEIN PO LIQD
11.0000 [oz_av] | Freq: Two times a day (BID) | ORAL | Status: DC
  Filled 2020-10-24: qty 330

## 2020-10-24 MED ORDER — CHLORTHALIDONE 25 MG PO TABS
25.0000 mg | ORAL_TABLET | Freq: Every day | ORAL | Status: DC
  Administered 2020-10-24 – 2020-10-25 (×2): 25 mg via ORAL
  Filled 2020-10-24 (×2): qty 1

## 2020-10-24 MED ORDER — FERROUS SULFATE 325 (65 FE) MG PO TABS
325.0000 mg | ORAL_TABLET | Freq: Every day | ORAL | Status: DC
  Administered 2020-10-24 – 2020-10-25 (×2): 325 mg via ORAL
  Filled 2020-10-24 (×2): qty 1

## 2020-10-24 MED ORDER — INSULIN ASPART 100 UNIT/ML IJ SOLN
5.0000 [IU] | Freq: Once | INTRAMUSCULAR | Status: AC
  Administered 2020-10-24: 5 [IU] via INTRAVENOUS
  Filled 2020-10-24: qty 1

## 2020-10-24 MED ORDER — HEPARIN BOLUS VIA INFUSION
2400.0000 [IU] | Freq: Once | INTRAVENOUS | Status: AC
  Administered 2020-10-24: 2400 [IU] via INTRAVENOUS
  Filled 2020-10-24: qty 2400

## 2020-10-24 MED ORDER — ATORVASTATIN CALCIUM 20 MG PO TABS: 80.0000 mg | ORAL_TABLET | Freq: Every day | ORAL | Status: DC

## 2020-10-24 MED ORDER — HEPARIN (PORCINE) 25000 UT/250ML-% IV SOLN
1300.0000 [IU]/h | INTRAVENOUS | Status: DC
  Administered 2020-10-24: 1050 [IU]/h via INTRAVENOUS
  Administered 2020-10-25: 1300 [IU]/h via INTRAVENOUS
  Filled 2020-10-24 (×2): qty 250

## 2020-10-24 MED ORDER — ISOSORBIDE MONONITRATE ER 60 MG PO TB24
60.0000 mg | ORAL_TABLET | Freq: Every day | ORAL | Status: DC
  Administered 2020-10-24 – 2020-10-25 (×2): 60 mg via ORAL
  Filled 2020-10-24 (×2): qty 1

## 2020-10-24 MED ORDER — INSULIN ASPART 100 UNIT/ML IJ SOLN
0.0000 [IU] | Freq: Three times a day (TID) | INTRAMUSCULAR | Status: DC
  Administered 2020-10-24: 11 [IU] via SUBCUTANEOUS
  Administered 2020-10-24: 4 [IU] via SUBCUTANEOUS
  Administered 2020-10-24: 15 [IU] via SUBCUTANEOUS
  Administered 2020-10-25: 11 [IU] via SUBCUTANEOUS
  Administered 2020-10-25: 4 [IU] via SUBCUTANEOUS
  Filled 2020-10-24 (×5): qty 1

## 2020-10-24 MED ORDER — PANTOPRAZOLE SODIUM 40 MG PO TBEC: 40.0000 mg | DELAYED_RELEASE_TABLET | Freq: Every day | ORAL | Status: DC

## 2020-10-24 MED ORDER — ZOLPIDEM TARTRATE 5 MG PO TABS: 5.0000 mg | ORAL_TABLET | Freq: Every evening | ORAL | Status: DC | PRN

## 2020-10-24 MED ORDER — NITROGLYCERIN 2 % TD OINT
1.0000 [in_us] | TOPICAL_OINTMENT | Freq: Four times a day (QID) | TRANSDERMAL | Status: DC
  Administered 2020-10-24: 1 [in_us] via TOPICAL
  Filled 2020-10-24: qty 1

## 2020-10-24 MED ORDER — MORPHINE SULFATE (PF) 2 MG/ML IV SOLN
2.0000 mg | INTRAVENOUS | Status: DC | PRN
Start: 2020-10-24 — End: 2020-10-25

## 2020-10-24 MED ORDER — DILTIAZEM HCL ER COATED BEADS 240 MG PO CP24
240.0000 mg | ORAL_CAPSULE | Freq: Every day | ORAL | Status: DC
  Administered 2020-10-24 – 2020-10-25 (×2): 240 mg via ORAL
  Filled 2020-10-24 (×2): qty 1
  Filled 2020-10-24 (×2): qty 2

## 2020-10-24 NOTE — ED Notes (Signed)
Critical Result Note: Glucose 501 Dr. Leonides Schanz notified

## 2020-10-24 NOTE — Progress Notes (Addendum)
[facility] NAME: [name]    MR#:  [id number]  DATE OF BIRTH:  [birth year]  SUBJECTIVE:   Patient had good breakfast. Denies any chest pain. He experiencing chest pain at home his wife called the ambulance. Vitals stable. Seen by cardiology Dr. Ubaldo Glassing earlier. Currently on IV heparin drip REVIEW OF SYSTEMS:   Review of Systems  Constitutional: Negative for chills, fever and weight loss.  HENT: Negative for ear discharge, ear pain and nosebleeds.   Eyes: Negative for blurred vision, pain and discharge.  Respiratory: Negative for sputum production, shortness of breath, wheezing and stridor.   Cardiovascular: Negative for chest pain, palpitations, orthopnea and PND.  Gastrointestinal: Negative for abdominal pain, diarrhea, nausea and vomiting.  Genitourinary: Negative for frequency and urgency.  Musculoskeletal: Negative for back pain and joint pain.  Neurological: Negative for sensory change, speech change, focal weakness and weakness.  Psychiatric/Behavioral: Negative for depression and hallucinations. The patient is not nervous/anxious.    Tolerating Diet:yes   DRUG ALLERGIES:   Allergies  Allergen Reactions  . Contrast Media [Iodinated Diagnostic Agents] Shortness Of Breath    SOB  . Iohexol Shortness Of Breath     Desc: Respiratory Distress, Laryngedema, diaphoresis, Onset Date: [id number]   . Metrizamide Shortness Of Breath    SOB SOB SOB   . Latex Rash    VITALS:  Blood pressure (!) 142/54, pulse 61, temperature 98.2 F (36.8 C), temperature source Oral, resp. rate 17, height 5\' 7"  (1.702 m), weight 86.3 kg, SpO2 95 %.  PHYSICAL EXAMINATION:   Physical Exam  GENERAL:  86 y.o.-year-old patient lying in the bed with no acute distress.  LUNGS: Normal breath sounds bilaterally, no wheezing, rales, rhonchi. No use of accessory muscles of respiration.  CARDIOVASCULAR: S1, S2 normal. No murmurs, rubs, or  gallops.  ABDOMEN: Soft, nontender, nondistended. Bowel sounds present. No organomegaly or mass.  EXTREMITIES: bilateral BKA+ NEUROLOGIC: non focal   PSYCHIATRIC:  patient is alert and oriented x 3.  SKIN: No obvious rash, lesion, or ulcer.   LABORATORY PANEL:  CBC Recent Labs  Lab 10/24/20 0059  WBC 9.9  HGB 13.4  HCT 39.2  PLT 202    Chemistries  Recent Labs  Lab 10/24/20 0059 10/24/20 0553  NA 133* 136  K 4.2 4.0  CL 97* 101  CO2 24 26  GLUCOSE 501* 364*  BUN 37* 34*  CREATININE 1.43* 1.35*  CALCIUM 9.0 9.0  AST 21  --   ALT 21  --   ALKPHOS 132*  --   BILITOT 0.5  --    Cardiac Enzymes No results for input(s): TROPONINI in the last 168 hours. RADIOLOGY:  NM Pulmonary Perfusion  Result Date: 10/24/2020 CLINICAL DATA:  Respiratory failure.  Recent chest pain EXAM: NUCLEAR MEDICINE PERFUSION LUNG SCAN TECHNIQUE: Perfusion images were obtained in multiple projections after intravenous injection of radiopharmaceutical. Views: Anterior posterior, left lateral, right lateral, RPO, LPO, RAO, open. RADIOPHARMACEUTICALS:  4.09 mCi Tc-75m MAA IV COMPARISON:  Chest radiograph October 24, 2020 FINDINGS: Radiotracer uptake is homogeneous and symmetric bilaterally. No perfusion defects evident. IMPRESSION: No perfusion defects evident. No findings indicative of pulmonary embolus. Electronically Signed   By: Lowella Grip III M.D.   On: 10/24/2020 14:01   DG Chest Portable 1 View  Result Date: 10/24/2020 CLINICAL DATA:  Chest pain, hypoxia, vomiting EXAM: PORTABLE CHEST 1 VIEW COMPARISON:  09/13/2020 FINDINGS: Single frontal view of the chest demonstrates a  stable cardiac silhouette. No acute airspace disease, effusion, or pneumothorax. No acute bony abnormalities. IMPRESSION: 1. No acute intrathoracic process. Electronically Signed   By: Randa Ngo M.D.   On: 10/24/2020 01:25   ASSESSMENT AND PLAN:   [name] is a 86 y.o. Caucasian male with medical history  significant for osteoarthritis, atrial fibrillation, CKD, CHF, type 2 diabetes mellitus, hypertension, dyslipidemia, peripheral vascular disease status post bilateral below-knee amputations, sleep apnea on CPAP, who presented to the ER with acute onset of midsternal chest pain that was 3-4/10 in severity at significantly improved with sublingual nitroglycerin.  Unstable angina/mild elevated troponin/history of CAD -- IV heparin drip -- patient is chest pain-free -- seen by Dr. Ubaldo Glassing continue medical management -- continue aspirin, statins,BB, Imdur, prn nitro  Elevated D dimer -- VQ scan negative  Type II diabetes with peripheral neuropathy -- continue SSI and lantus for now -- on metformin, insulin at home will resume at discharge -- continue gabapentin  Paroxysmal atrial fibrillation -- continue  Cardizem CD  Gerd PPI   Procedures: Family communication : wife Consults : cardiology Dr. Ubaldo Glassing CODE STATUS: DNR DVT Prophylaxis : heparin drip Level of care: Progressive Cardiac Status is: Observation  The patient remains OBS appropriate and will d/c before 2 midnights.  Dispo: The patient is from: Home              Anticipated d/c is to: Home              Patient currently is not medically stable to d/c.   Difficult to place patient No        TOTAL TIME TAKING CARE OF THIS PATIENT: 25 minutes.  >50% time spent on counselling and coordination of care  Note: This dictation was prepared with Dragon dictation along with smaller phrase technology. Any transcriptional errors that result from this process are unintentional.  [name] Mandes M.D    [facility]   CC: Primary care physician; [facility], IncPatient ID: [name], male   DOB: [birth year], 86 y.o.   MRN: [id number]

## 2020-10-24 NOTE — ED Triage Notes (Signed)
Pt presents via EMS from home for c/o chest pain and vomiting that woke him from sleep an hour ago. Pt sts he started feeling bad and then vomited x 1. Pt was given ASA and 2 SL nitro sprays by ems and pain is resolved at this time. Pt was hypoxic on RA on arrival and denies any home O2 use. Pt placed on 2L [place]. Denies SOB.  Dr Leonides Schanz at bedside on pt arrival.

## 2020-10-24 NOTE — Progress Notes (Signed)
[place] for heparin infusion Indication: chest pain/ACS/STEMI  Allergies  Allergen Reactions  . Contrast Media [Iodinated Diagnostic Agents] Shortness Of Breath    SOB  . Iohexol Shortness Of Breath     Desc: Respiratory Distress, Laryngedema, diaphoresis, Onset Date: [id number]   . Metrizamide Shortness Of Breath    SOB SOB SOB   . Latex Rash    Patient Measurements: Height: 5\' 7"  (170.2 cm) Weight: 81.6 kg (180 lb) IBW/kg (Calculated) : 66.1 Heparin Dosing Weight: 81.6 kg  Vital Signs: Temp: 98.8 F (37.1 C) (06/03 0049) Temp Source: Oral (06/03 0049) BP: 143/80 (06/03 0100) Pulse Rate: 73 (06/03 0100)  Labs: Recent Labs    10/24/20 0059  HGB 13.4  HCT 39.2  PLT 202  CREATININE 1.43*  TROPONINIHS 95*    Estimated Creatinine Clearance: 40 mL/min (A) (by C-G formula based on SCr of 1.43 mg/dL (H)).   Medical History: Past Medical History:  Diagnosis Date  . Arthritis   . Atrial fibrillation ([place])   . Carcinoma of prostate ([place])   . CHF (congestive heart failure) ([place])   . Chronic kidney disease   . Diabetes mellitus without complication ([place])   . ED (erectile dysfunction)   . Frequent urination   . Hyperlipidemia   . Hypertension   . Moderate mitral insufficiency   . Peripheral vascular disease ([place])   . Pneumonia 04/2016  . Prostate cancer ([place])   . Sleep apnea    OSA--USE C-PAP  . Ulcer of left foot due to type 2 diabetes mellitus ([place])   . Urinary stress incontinence, male     Medications:  PTA meds include Xarelto 15 mg daily  Assessment:  Pt is a 86 y.o. male history of a fib, CKD, CHF, HTN, diabetes, hyperlipidemia who presents to the emergency department via EMS w/ c/o acute onset of left-sided chest pain and vomiting.  Per MD pt is a  poor historian.  Goal of Therapy:  Heparin level 0.3-0.7 units/ml Monitor platelets by anticoagulation protocol: Yes  Goal aPTT: 66-102   Plan:  Ordered  baseline labs Ordered 4000 units bolus x 1 Ordered heparin infusion to start at 1050 units/hr Due to [place] med PTA, will monitor aPTT until it correlates with HL. Will check aPTT 8 hours after start of infusion. HL and CBC daily while on heparin.  Renda Rolls, PharmD, [facility] 10/24/2020 2:40 AM

## 2020-10-24 NOTE — ED Provider Notes (Signed)
[facility] Emergency Department Provider Note  ____________________________________________   Event Date/Time   First MD Initiated Contact with Patient 10/24/20 0050     (approximate)  I have reviewed the triage vital signs and the nursing notes.   HISTORY  Chief Complaint Chest Pain    HPI [name] is a 86 y.o. male history of atrial fibrillation, chronic kidney disease, CHF, hypertension, diabetes, hyperlipidemia who presents to the emergency department with complaints of left-sided chest pain that started sometime tonight.  Patient is unfortunately a very poor historian.  He is unable to tell me when this started but states this started before he went to sleep.  He is not sure what time he went to sleep.  States his wife woke him up and he began complaining of chest pain to her and she called 911 just prior to arrival.  He states he had nausea and vomited once.  No shortness of breath, diaphoresis or dizziness.  Was given aspirin, 2 sublingual nitroglycerin sprays, zofran with EMS and now is asymptomatic.  He is hypoxic here into the upper 80s.  Does not wear oxygen at home.  He denies asthma, COPD, CHF although has a diagnosis of CHF in his chart.  Denies fevers, cough.  Is status post bilateral BKA.  Denies history of PE or DVT.  Denies abdominal pain, diarrhea.  He is unable to tell me if there are any aggravating or alleviating factors other than improvement after aspirin, nitroglycerin and Zofran.  Unable to tell me if there was any radiation of pain.        Past Medical History:  Diagnosis Date  . Arthritis   . Atrial fibrillation ([place])   . Carcinoma of prostate ([place])   . CHF (congestive heart failure) ([place])   . Chronic kidney disease   . Diabetes mellitus without complication ([place])   . ED (erectile dysfunction)   . Frequent urination   . Hyperlipidemia   . Hypertension   . Moderate mitral insufficiency   . Peripheral vascular  disease ([place])   . Pneumonia 04/2016  . Prostate cancer ([place])   . Sleep apnea    OSA--USE C-PAP  . Ulcer of left foot due to type 2 diabetes mellitus ([place])   . Urinary stress incontinence, male     Patient Active Problem List   Diagnosis Date Noted  . Acute encephalopathy 07/29/2020  . Lactic acidosis 07/28/2020  . Hypoxic 07/28/2020  . Chronic prescription opiate use 07/28/2020  . Elevated troponin 07/28/2020  . Chronic anticoagulation 06/25/2020  . Chronic kidney disease, stage 3a ([place]) 06/25/2020  . AMS (altered mental status) 04/08/2020  . CAD (coronary artery disease) 03/06/2020  . Personal history of COVID-19 03/06/2020  . Acute toxic-metabolic encephalopathy 78/29/5621  . UTI (urinary tract infection) 03/06/2020  . (HFpEF) heart failure with preserved ejection fraction ([place]) 03/06/2020  . Goals of care, counseling/discussion   . Palliative care by specialist   . Seizure disorder ([place]) 11/30/2019  . ACS (acute coronary syndrome) ([place]) 06/24/2019  . Sinus bradycardia on ECG 05/16/2019  . Paroxysmal A-fib ([place]) 03/27/2019  . Hx of BKA, right ([place]) 03/02/2019  . Severe protein-calorie malnutrition ([place]) 02/16/2019  . Acute systolic CHF (congestive heart failure) ([place]) 02/16/2019  . Acute respiratory failure with hypoxia ([place]) 02/08/2019  . Pneumonia due to COVID-19 virus 02/08/2019  . Below-knee amputation of right lower extremity ([place]) 01/15/2019  . Polyp of descending colon   . Iron deficiency anemia due to  chronic blood loss   . Sacral decubitus ulcer, stage II ([place]) 12/26/2018  . Acute respiratory failure ([place]) 12/20/2018  . Femur fracture ([place]) 10/13/2018  . Chest pain 06/03/2018  . NSTEMI (non-ST elevated myocardial infarction) ([place]) 01/16/2018  . Acute CHF (congestive heart failure) ([place]) 11/23/2017  . Atrial fibrillation with rapid ventricular response ([place]) 10/15/2017  . BKA stump complication ([place]) 25/09/3974  . Complete below-knee amputation of left lower  extremity ([place]) 02/10/2017  . S/P bilateral BKA (below knee amputation) ([place]) 02/01/2017  . Chronic diastolic heart failure ([place]) 01/21/2017  . Pressure injury of skin 01/18/2017  . Atherosclerosis of artery of extremity with gangrene ([place]) 01/05/2017  . Diabetic foot ulcer ([place]) 12/29/2016  . Ataxia 10/21/2016  . Atherosclerosis of native arteries of the extremities with ulceration ([place]) 10/12/2016  . History of femoral angiogram 09/06/2016  . Left leg pain 09/06/2016  . Leukocytosis 09/06/2016  . Generalized weakness 09/06/2016  . AKI (acute kidney injury) ([place]) 09/06/2016  . Atrial fibrillation, chronic ([place]) 08/30/2016  . Pneumonia 05/19/2016  . Hyperlipidemia 04/20/2016  . Back pain 04/19/2016  . Type 2 diabetes mellitus treated with insulin ([place]) 04/19/2016  . Essential hypertension 04/19/2016  . PAD (peripheral artery disease) ([place]) 04/19/2016  . Erectile dysfunction following radical prostatectomy 06/25/2015  . History of prostate cancer 12/23/2014  . Incontinence 12/23/2014    Past Surgical History:  Procedure Laterality Date  . AMPUTATION Left 01/12/2017   Procedure: AMPUTATION BELOW KNEE;  Surgeon: Algernon Huxley, MD;  Location: [facility] ORS;  Service: General;  Laterality: Left;  . AMPUTATION Right 12/27/2018   Procedure: AMPUTATION BELOW KNEE;  Surgeon: Algernon Huxley, MD;  Location: [facility] ORS;  Service: General;  Laterality: Right;  . APLIGRAFT PLACEMENT Left 10/15/2016   Procedure: APLIGRAFT PLACEMENT;  Surgeon: Albertine Patricia, DPM;  Location: [facility] ORS;  Service: Podiatry;  Laterality: Left;  necrotic ulcer  . CHOLECYSTECTOMY    . COLONOSCOPY WITH PROPOFOL N/A 01/02/2019   Procedure: COLONOSCOPY WITH PROPOFOL;  Surgeon: Lucilla Lame, MD;  Location: [facility] ENDOSCOPY;  Service: Endoscopy;  Laterality: N/A;  . COLONOSCOPY WITH PROPOFOL N/A 01/05/2019   Procedure: COLONOSCOPY WITH PROPOFOL;  Surgeon: Lucilla Lame, MD;  Location: [facility] ENDOSCOPY;  Service: Endoscopy;  Laterality: N/A;   . ESOPHAGOGASTRODUODENOSCOPY (EGD) WITH PROPOFOL N/A 01/02/2019   Procedure: ESOPHAGOGASTRODUODENOSCOPY (EGD) WITH PROPOFOL;  Surgeon: Lucilla Lame, MD;  Location: [facility] ENDOSCOPY;  Service: Endoscopy;  Laterality: N/A;  . EYE SURGERY Bilateral    Cataract Extraction with IOL  . HIP ARTHROPLASTY Right 10/13/2018   Procedure: ARTHROPLASTY BIPOLAR HIP (HEMIARTHROPLASTY);  Surgeon: Earnestine Leys, MD;  Location: [facility] ORS;  Service: Orthopedics;  Laterality: Right;  . IRRIGATION AND DEBRIDEMENT FOOT Left 10/15/2016   Procedure: IRRIGATION AND DEBRIDEMENT FOOT-EXCISIONAL DEBRIDEMENT OF SKIN 3RD, 4TH AND 5TH TOES WITH APPLICATION OF APLIGRAFT;  Surgeon: Albertine Patricia, DPM;  Location: [facility] ORS;  Service: Podiatry;  Laterality: Left;  necrotic,gangrene  . IRRIGATION AND DEBRIDEMENT FOOT Left 12/09/2016   Procedure: IRRIGATION AND DEBRIDEMENT FOOT-MUSCLE/FASCIA, RESECTION OF FOURTH AND FIFTH METATARSAL NECROTIC BONE;  Surgeon: Albertine Patricia, DPM;  Location: [facility] ORS;  Service: Podiatry;  Laterality: Left;  . IRRIGATION AND DEBRIDEMENT FOOT Right 12/23/2018   Procedure: IRRIGATION AND DEBRIDEMENT FOOT and wound vac application;  Surgeon: Samara Deist, DPM;  Location: [facility] ORS;  Service: Podiatry;  Laterality: Right;  . LEFT HEART CATH N/A 06/25/2019   Procedure: Left Heart Cath and Coronary Angiography;  Surgeon: Isaias Cowman, MD;  Location: [place] CV LAB;  Service: Cardiovascular;  Laterality: N/A;  . LEFT HEART CATH AND CORONARY ANGIOGRAPHY N/A 06/05/2018   Procedure: LEFT HEART CATH AND CORONARY ANGIOGRAPHY;  Surgeon: Yolonda Kida, MD;  Location: [place] CV LAB;  Service: Cardiovascular;  Laterality: N/A;  . LOWER EXTREMITY ANGIOGRAPHY Left 08/16/2016   Procedure: Lower Extremity Angiography;  Surgeon: Algernon Huxley, MD;  Location: [place] CV LAB;  Service: Cardiovascular;  Laterality: Left;  . LOWER EXTREMITY ANGIOGRAPHY Left 09/01/2016   Procedure: Lower Extremity  Angiography;  Surgeon: Algernon Huxley, MD;  Location: [place] CV LAB;  Service: Cardiovascular;  Laterality: Left;  . LOWER EXTREMITY ANGIOGRAPHY Left 10/14/2016   Procedure: Lower Extremity Angiography;  Surgeon: Algernon Huxley, MD;  Location: [place] [facility] LAB;  Service: Cardiovascular;  Laterality: Left;  . LOWER EXTREMITY ANGIOGRAPHY Left 12/20/2016   Procedure: Lower Extremity Angiography;  Surgeon: Algernon Huxley, MD;  Location: [place] CV LAB;  Service: Cardiovascular;  Laterality: Left;  . LOWER EXTREMITY ANGIOGRAPHY Right 12/18/2018   Procedure: LOWER EXTREMITY ANGIOGRAPHY;  Surgeon: Algernon Huxley, MD;  Location: [place] CV LAB;  Service: Cardiovascular;  Laterality: Right;  . LOWER EXTREMITY INTERVENTION  09/01/2016   Procedure: Lower Extremity Intervention;  Surgeon: Algernon Huxley, MD;  Location: [place] CV LAB;  Service: Cardiovascular;;  . PROSTATE SURGERY     removal  . TONSILLECTOMY     as a child    Prior to Admission medications   Medication Sig Start Date End Date Taking? Authorizing Provider  atorvastatin (LIPITOR) 80 MG tablet Take 80 mg by mouth at bedtime.    [provider]  bisacodyl (DULCOLAX) 5 MG EC tablet Take 1 tablet (5 mg total) by mouth daily as needed for moderate constipation. 04/10/20   Lorella Nimrod, MD  chlorthalidone (HYGROTON) 25 MG tablet Take 25 mg by mouth daily.    [provider]  diltiazem (CARDIZEM CD) 240 MG 24 hr capsule Take 1 capsule (240 mg total) by mouth daily. 08/05/20   Shelly Coss, MD  divalproex (DEPAKOTE) 500 MG DR tablet Take 500 mg by mouth daily.  02/18/20   [provider]  Ensure Max Protein (ENSURE MAX PROTEIN) LIQD Take 330 mLs (11 oz total) by mouth 2 (two) times daily. 04/10/20   Lorella Nimrod, MD  ferrous sulfate 325 (65 FE) MG tablet Take 325 mg by mouth daily with breakfast.    [provider]  gabapentin (NEURONTIN) 100 MG capsule Take 1 capsule (100 mg total) by mouth 3  (three) times daily. 07/30/2020   Shelly Coss, MD  Insulin Glargine (BASAGLAR KWIKPEN) 100 UNIT/ML Inject 20 Units into the skin at bedtime. 07/22/2020   Shelly Coss, MD  isosorbide mononitrate (IMDUR) 30 MG 24 hr tablet Take 60 mg by mouth daily. 11/07/19   [provider]  metFORMIN (GLUCOPHAGE) 500 MG tablet Take 500 mg by mouth 2 (two) times daily with a meal.    [provider]  metoprolol succinate (TOPROL-XL) 25 MG 24 hr tablet Take 1 tablet (25 mg total) by mouth daily. Take with or immediately following a meal. 04/10/20   Lorella Nimrod, MD  NOVOLOG FLEXPEN 100 UNIT/ML FlexPen Inject 4 Units into the skin 3 (three) times daily with meals. 04/08/20   [provider]  pantoprazole (PROTONIX) 40 MG tablet Take 1 tablet (40 mg total) by mouth at bedtime for 14 days. 08/02/2020 08/18/20  Shelly Coss, MD  Rivaroxaban (XARELTO) 15 MG TABS tablet Take 1 tablet (  15 mg total) by mouth daily with supper. Hold Xarelto for 3 days.  Start on January 09, 2019. 01/09/19   [name], MD    Allergies Contrast media [iodinated diagnostic agents], Iohexol, Metrizamide, and Latex  Family History  Problem Relation Age of Onset  . Hypertension Mother   . Diabetes Mother   . Prostate cancer Neg Hx   . Bladder Cancer Neg Hx   . Kidney disease Neg Hx     Social History Social History   Tobacco Use  . Smoking status: Former Smoker    Packs/day: 0.25    Types: Cigarettes    Quit date: 10/14/1994    Years since quitting: 26.0  . Smokeless tobacco: Never Used  Vaping Use  . Vaping Use: Never used  Substance Use Topics  . Alcohol use: No    Alcohol/week: 0.0 standard drinks  . Drug use: No    Review of Systems -limited as patient is an extremely poor historian Constitutional: No fever. Eyes: No visual changes. ENT: No sore throat. Cardiovascular: + chest pain. Respiratory: Denies shortness of breath. Gastrointestinal: + nausea, vomiting.  No diarrhea. Genitourinary:  Negative for dysuria. Musculoskeletal: Negative for back pain. Skin: Negative for rash. Neurological: Negative for focal weakness or numbness.  ____________________________________________   PHYSICAL EXAM:  VITAL SIGNS: ED Triage Vitals  Enc Vitals Group     BP 10/24/20 0051 (!) 167/95     Pulse Rate 10/24/20 0049 94     Resp 10/24/20 0049 (!) 21     Temp 10/24/20 0049 98.8 F (37.1 C)     Temp Source 10/24/20 0049 Oral     SpO2 10/24/20 0045 (!) 89 %     Weight 10/24/20 0051 180 lb (81.6 kg)     Height 10/24/20 0051 5\' 7"  (1.702 m)     Head Circumference --      Peak Flow --      Pain Score 10/24/20 0052 0     Pain Loc --      Pain Edu? --      Excl. in [place]? --    CONSTITUTIONAL: Alert and oriented and responds appropriately to questions.  Elderly, in no distress HEAD: Normocephalic EYES: Conjunctivae clear, pupils appear equal, EOM appear intact ENT: normal nose; moist mucous membranes NECK: Supple, normal ROM CARD: RRR; S1 and S2 appreciated; no murmurs, no clicks, no rubs, no gallops RESP: Normal chest excursion without splinting or tachypnea; breath sounds clear and equal bilaterally; no wheezes, no rhonchi, no rales, no hypoxia or respiratory distress, speaking full sentences ABD/GI: Normal bowel sounds; non-distended; soft, non-tender, no rebound, no guarding, no peritoneal signs, no hepatosplenomegaly BACK: The back appears normal EXT: Normal ROM in all joints; no deformity noted, no edema; no cyanosis, status post bilateral BKA SKIN: Normal color for age and race; warm; no rash on exposed skin NEURO: Moves all extremities equally PSYCH: The patient's mood and manner are appropriate.  ____________________________________________   LABS (all labs ordered are listed, but only abnormal results are displayed)  Labs Reviewed  COMPREHENSIVE METABOLIC PANEL - Abnormal; Notable for the following components:      Result Value   Sodium 133 (*)    Chloride 97 (*)     Glucose, Bld 501 (*)    BUN 37 (*)    Creatinine, Ser 1.43 (*)    Alkaline Phosphatase 132 (*)    GFR, Estimated 49 (*)    All other components within normal limits  BRAIN NATRIURETIC PEPTIDE -  Abnormal; Notable for the following components:   B Natriuretic Peptide 464.5 (*)    All other components within normal limits  D-DIMER, QUANTITATIVE - Abnormal; Notable for the following components:   D-Dimer, Quant 0.76 (*)    All other components within normal limits  TROPONIN I (HIGH SENSITIVITY) - Abnormal; Notable for the following components:   Troponin I (High Sensitivity) 95 (*)    All other components within normal limits  RESP PANEL BY RT-PCR (FLU A&B, COVID) ARPGX2  CBC WITH DIFFERENTIAL/PLATELET  LIPASE, BLOOD  URINALYSIS, COMPLETE (UACMP) WITH MICROSCOPIC  PROTIME-INR  HEPARIN LEVEL (UNFRACTIONATED)  APTT  CBG MONITORING, ED  TROPONIN I (HIGH SENSITIVITY)   ____________________________________________  EKG   EKG Interpretation  Date/Time:  Friday October 24 2020 00:50:29 EDT Ventricular Rate:  89 PR Interval:  170 QRS Duration: 97 QT Interval:  334 QTC Calculation: 407 R Axis:   27 Text Interpretation: Sinus rhythm Atrial premature complex Anteroseptal infarct, old Repol abnrm, severe global ischemia (LM/MVD) No significant change since last tracing Confirmed by Pryor Curia [phone]) on 10/24/2020 12:57:36 AM       ____________________________________________  RADIOLOGY Jessie Foot Anjani Feuerborn, personally viewed and evaluated these images (plain radiographs) as part of my medical decision making, as well as reviewing the written report by the radiologist.  ED MD interpretation: Chest x-ray clear  Official radiology report(s): DG Chest Portable 1 View  Result Date: 10/24/2020 CLINICAL DATA:  Chest pain, hypoxia, vomiting EXAM: PORTABLE CHEST 1 VIEW COMPARISON:  09/13/2020 FINDINGS: Single frontal view of the chest demonstrates a stable cardiac silhouette. No acute airspace  disease, effusion, or pneumothorax. No acute bony abnormalities. IMPRESSION: 1. No acute intrathoracic process. Electronically Signed   By: Randa Ngo M.D.   On: 10/24/2020 01:25    ____________________________________________   PROCEDURES  Procedure(s) performed (including Critical Care):  Procedures  CRITICAL CARE Performed by: Cyril Mourning Arya Boxley   Total critical care time: 55 minutes  Critical care time was exclusive of separately billable procedures and treating other patients.  Critical care was necessary to treat or prevent imminent or life-threatening deterioration.  Critical care was time spent personally by me on the following activities: development of treatment plan with patient and/or surrogate as well as nursing, discussions with consultants, evaluation of patient's response to treatment, examination of patient, obtaining history from patient or surrogate, ordering and performing treatments and interventions, ordering and review of laboratory studies, ordering and review of radiographic studies, pulse oximetry and re-evaluation of patient's condition.  ____________________________________________   INITIAL IMPRESSION / ASSESSMENT AND PLAN / ED COURSE  As part of my medical decision making, I reviewed the following data within the [place] notes reviewed and incorporated, Labs reviewed , EKG interpreted , Old EKG reviewed, Old chart reviewed, Radiograph reviewed , Discussed with admitting physician  and Notes from prior ED visits         Patient here with complaints of chest pain, vomiting.  Also found to be hypoxic.  Differential includes ACS, PE, dissection, CHF exacerbation, pneumonia, pneumothorax.  Abdominal exam benign.  His EKG shows diffuse ST depressions which are slightly worse compared to previous.  Does not meet STEMI criteria.  He is hypoxic here and does not wear oxygen chronically.  Labs, chest x-ray pending.  Anticipate the  patient will need admission.  ED PROGRESS  Patient's labs show hyperglycemia without DKA.  Troponin elevated at 95 which is slightly higher than his previous troponins in the 30s and 40s.  BNP mildly elevated at 464 but he has no sign of peripheral edema, JVD on exam and chest x-ray is clear.  Age-adjusted D-dimer is negative.  Will discuss with medicine for admission.  We will start patient on heparin given worsening EKG and mildly elevated troponin compared to baseline.   2:40 AM Discussed patient's case with hospitalist, Dr. Sidney Ace.  I have recommended admission and patient (and family if present) agree with this plan. Admitting physician will place admission orders.   I reviewed all nursing notes, vitals, pertinent previous records and reviewed/interpreted all EKGs, lab and urine results, imaging (as available).   ____________________________________________   FINAL CLINICAL IMPRESSION(S) / ED DIAGNOSES  Final diagnoses:  Nonspecific chest pain  Acute respiratory failure with hypoxia (HCC)  Hyperglycemia     ED Discharge Orders    None      *Please note:  [name] was evaluated in Emergency Department on 10/24/2020 for the symptoms described in the history of present illness. He was evaluated in the context of the global COVID-19 pandemic, which necessitated consideration that the patient might be at risk for infection with the SARS-CoV-2 virus that causes COVID-19. Institutional protocols and algorithms that pertain to the evaluation of patients at risk for COVID-19 are in a state of rapid change based on information released by regulatory bodies including the CDC and federal and state organizations. These policies and algorithms were followed during the patient's care in the ED.  Some ED evaluations and interventions may be delayed as a result of limited staffing during and the pandemic.*   Note:  This document was prepared using Dragon voice recognition software and  may include unintentional dictation errors.   Tyteanna Ost, Delice Bison, DO 10/24/20 [phone]

## 2020-10-24 NOTE — Consult Note (Signed)
Cardiology Consultation Note    Patient ID: [name], MRN: [id number], DOB/AGE: 11-20-1934 86 y.o. Admit date: 10/24/2020   Date of Consult: 10/24/2020 Primary Physician: [place] Primary Cardiologist: Dr. Nehemiah Massed  Chief Complaint: Chest pain Reason for Consultation: Chest pain/abnormal troponin/A. fib Requesting MD: Dr. Janna Arch  HPI: [name] is a 86 y.o. male with history of atrial fibrillation treated with rate control and anticoagulation with Xarelto, chronic kidney disease, CHF, type 2 diabetes, hypertension, dyslipidemia, peripheral vascular disease status post bilateral BKA, sleep apnea on CPAP who presented to emergency room with chest pain.  It improved with sublingual nitro.  It awoke him from sleep.  He had some nausea and vomiting with his chest pain with no diaphoresis.  He had no hemoptysis no headache.  EKG revealed sinus rhythm with ST depression in the inferolateral leads.  This was somewhat more notable than previous EKGs.  Initial serum troponin was 95 with a subsequent value of 435.  Chest x-ray revealed no acute cardiopulmonary disease.  Serum creatinine is 1.43 on admission 1.35 today which is his baseline.  GFR of 52.  He was on hemodynamically stable with a room air pulse ox of 89% on presentation increased to 94% with 3 L.  Significantly hyperglycemic on presentation.  BNP was 464.5.  Is COVID-negative.  He was treated with sublingual nitroglycerin and Zofran and started on a heparin drip.  Echocardiogram done in March of 2 2 revealed preserved LV function with no significant valvular abnormalities.  As an outpatient he is treated with atorvastatin 80 mg daily, chlorthalidone 25 daily, diltiazem 240 mg daily, Depakote 500 mg daily, Imdur 60 mg daily, metoprolol succinate 25 mg daily, rivaroxaban 15 mg daily.  VQ scan is pending.  Most recent left heart catheterization was done in February 2021 which revealed chronically occluded ostial  LAD which was noted when a cath in 2020 with insignificant disease in his circumflex.  He is ostial RCA was a 50% stenosis.  EF by cath at that time was 35 to 40%.  Echo at that same time showed an EF which was well preserved.  Patient is a difficult historian.  He states the pain is constantly there but then later says he is pain-free.  Somewhat confused and difficult historian. Past Medical History:  Diagnosis Date  . Arthritis   . Atrial fibrillation ([place])   . Carcinoma of prostate ([place])   . CHF (congestive heart failure) ([place])   . Chronic kidney disease   . Diabetes mellitus without complication ([place])   . ED (erectile dysfunction)   . Frequent urination   . Hyperlipidemia   . Hypertension   . Moderate mitral insufficiency   . Peripheral vascular disease ([place])   . Pneumonia 04/2016  . Prostate cancer ([employer])   . Sleep apnea    OSA--USE C-PAP  . Ulcer of left foot due to type 2 diabetes mellitus ([place])   . Urinary stress incontinence, male       Surgical History:  Past Surgical History:  Procedure Laterality Date  . AMPUTATION Left 01/12/2017   Procedure: AMPUTATION BELOW KNEE;  Surgeon: Algernon Huxley, MD;  Location: [facility] ORS;  Service: General;  Laterality: Left;  . AMPUTATION Right 12/27/2018   Procedure: AMPUTATION BELOW KNEE;  Surgeon: Algernon Huxley, MD;  Location: [facility] ORS;  Service: General;  Laterality: Right;  . APLIGRAFT PLACEMENT Left 10/15/2016   Procedure: APLIGRAFT PLACEMENT;  Surgeon: Albertine Patricia, DPM;  Location: [facility]  ORS;  Service: Podiatry;  Laterality: Left;  necrotic ulcer  . CHOLECYSTECTOMY    . COLONOSCOPY WITH PROPOFOL N/A 01/02/2019   Procedure: COLONOSCOPY WITH PROPOFOL;  Surgeon: Lucilla Lame, MD;  Location: [facility] ENDOSCOPY;  Service: Endoscopy;  Laterality: N/A;  . COLONOSCOPY WITH PROPOFOL N/A 01/05/2019   Procedure: COLONOSCOPY WITH PROPOFOL;  Surgeon: Lucilla Lame, MD;  Location: [facility] ENDOSCOPY;  Service: Endoscopy;  Laterality: N/A;  .  ESOPHAGOGASTRODUODENOSCOPY (EGD) WITH PROPOFOL N/A 01/02/2019   Procedure: ESOPHAGOGASTRODUODENOSCOPY (EGD) WITH PROPOFOL;  Surgeon: Lucilla Lame, MD;  Location: [facility] ENDOSCOPY;  Service: Endoscopy;  Laterality: N/A;  . EYE SURGERY Bilateral    Cataract Extraction with IOL  . HIP ARTHROPLASTY Right 10/13/2018   Procedure: ARTHROPLASTY BIPOLAR HIP (HEMIARTHROPLASTY);  Surgeon: Earnestine Leys, MD;  Location: [facility] ORS;  Service: Orthopedics;  Laterality: Right;  . IRRIGATION AND DEBRIDEMENT FOOT Left 10/15/2016   Procedure: IRRIGATION AND DEBRIDEMENT FOOT-EXCISIONAL DEBRIDEMENT OF SKIN 3RD, 4TH AND 5TH TOES WITH APPLICATION OF APLIGRAFT;  Surgeon: Albertine Patricia, DPM;  Location: [facility] ORS;  Service: Podiatry;  Laterality: Left;  necrotic,gangrene  . IRRIGATION AND DEBRIDEMENT FOOT Left 12/09/2016   Procedure: IRRIGATION AND DEBRIDEMENT FOOT-MUSCLE/FASCIA, RESECTION OF FOURTH AND FIFTH METATARSAL NECROTIC BONE;  Surgeon: Albertine Patricia, DPM;  Location: [facility] ORS;  Service: Podiatry;  Laterality: Left;  . IRRIGATION AND DEBRIDEMENT FOOT Right 12/23/2018   Procedure: IRRIGATION AND DEBRIDEMENT FOOT and wound vac application;  Surgeon: Samara Deist, DPM;  Location: [facility] ORS;  Service: Podiatry;  Laterality: Right;  . LEFT HEART CATH N/A 06/25/2019   Procedure: Left Heart Cath and Coronary Angiography;  Surgeon: Isaias Cowman, MD;  Location: [place] CV LAB;  Service: Cardiovascular;  Laterality: N/A;  . LEFT HEART CATH AND CORONARY ANGIOGRAPHY N/A 06/05/2018   Procedure: LEFT HEART CATH AND CORONARY ANGIOGRAPHY;  Surgeon: Yolonda Kida, MD;  Location: [place] CV LAB;  Service: Cardiovascular;  Laterality: N/A;  . LOWER EXTREMITY ANGIOGRAPHY Left 08/16/2016   Procedure: Lower Extremity Angiography;  Surgeon: Algernon Huxley, MD;  Location: [place] CV LAB;  Service: Cardiovascular;  Laterality: Left;  . LOWER EXTREMITY ANGIOGRAPHY Left 09/01/2016   Procedure: Lower Extremity Angiography;   Surgeon: Algernon Huxley, MD;  Location: [place] CV LAB;  Service: Cardiovascular;  Laterality: Left;  . LOWER EXTREMITY ANGIOGRAPHY Left 10/14/2016   Procedure: Lower Extremity Angiography;  Surgeon: Algernon Huxley, MD;  Location: [place] CV LAB;  Service: Cardiovascular;  Laterality: Left;  . LOWER EXTREMITY ANGIOGRAPHY Left 12/20/2016   Procedure: Lower Extremity Angiography;  Surgeon: Algernon Huxley, MD;  Location: [place] CV LAB;  Service: Cardiovascular;  Laterality: Left;  . LOWER EXTREMITY ANGIOGRAPHY Right 12/18/2018   Procedure: LOWER EXTREMITY ANGIOGRAPHY;  Surgeon: Algernon Huxley, MD;  Location: [place] [facility] LAB;  Service: Cardiovascular;  Laterality: Right;  . LOWER EXTREMITY INTERVENTION  09/01/2016   Procedure: Lower Extremity Intervention;  Surgeon: Algernon Huxley, MD;  Location: [place] CV LAB;  Service: Cardiovascular;;  . PROSTATE SURGERY     removal  . TONSILLECTOMY     as a child     Home Meds: Prior to Admission medications   Medication Sig Start Date End Date Taking? Authorizing Provider  atorvastatin (LIPITOR) 80 MG tablet Take 80 mg by mouth at bedtime.    [provider]  bisacodyl (DULCOLAX) 5 MG EC tablet Take 1 tablet (5 mg total) by mouth daily as needed for moderate constipation. 04/10/20   Lorella Nimrod, MD  chlorthalidone (  HYGROTON) 25 MG tablet Take 25 mg by mouth daily.    [provider]  diltiazem (CARDIZEM CD) 240 MG 24 hr capsule Take 1 capsule (240 mg total) by mouth daily. 08/05/20   Shelly Coss, MD  divalproex (DEPAKOTE) 500 MG DR tablet Take 500 mg by mouth daily.  02/18/20   [provider]  Ensure Max Protein (ENSURE MAX PROTEIN) LIQD Take 330 mLs (11 oz total) by mouth 2 (two) times daily. 04/10/20   Lorella Nimrod, MD  ferrous sulfate 325 (65 FE) MG tablet Take 325 mg by mouth daily with breakfast.    [provider]  gabapentin (NEURONTIN) 100 MG capsule Take 1 capsule (100 mg total) by mouth 3  (three) times daily. 07/31/2020   Shelly Coss, MD  Insulin Glargine (BASAGLAR KWIKPEN) 100 UNIT/ML Inject 20 Units into the skin at bedtime. 08/10/2020   Shelly Coss, MD  isosorbide mononitrate (IMDUR) 30 MG 24 hr tablet Take 60 mg by mouth daily. 11/07/19   [provider]  metFORMIN (GLUCOPHAGE) 500 MG tablet Take 500 mg by mouth 2 (two) times daily with a meal.    [provider]  metoprolol succinate (TOPROL-XL) 25 MG 24 hr tablet Take 1 tablet (25 mg total) by mouth daily. Take with or immediately following a meal. 04/10/20   Lorella Nimrod, MD  NOVOLOG FLEXPEN 100 UNIT/ML FlexPen Inject 4 Units into the skin 3 (three) times daily with meals. 04/08/20   [provider]  pantoprazole (PROTONIX) 40 MG tablet Take 1 tablet (40 mg total) by mouth at bedtime for 14 days. 08/01/2020 08/18/20  Shelly Coss, MD  Rivaroxaban (XARELTO) 15 MG TABS tablet Take 1 tablet (15 mg total) by mouth daily with supper. Hold Xarelto for 3 days.  Start on January 09, 2019. 01/09/19   [name], MD    Inpatient Medications:  . aspirin EC  325 mg Oral Daily  . atorvastatin  80 mg Oral QHS  . chlorthalidone  25 mg Oral Daily  . diltiazem  240 mg Oral Daily  . divalproex  500 mg Oral Daily  . ferrous sulfate  325 mg Oral Q breakfast  . gabapentin  100 mg Oral TID  . insulin aspart  0-20 Units Subcutaneous TID WC  . insulin glargine  20 Units Subcutaneous QHS  . isosorbide mononitrate  60 mg Oral Daily  . metoprolol succinate  25 mg Oral Q breakfast  . nitroGLYCERIN  1 inch Topical Q6H  . pantoprazole  40 mg Oral QHS  . Ensure Max Protein  11 oz Oral BID   . sodium chloride 100 mL/hr at 10/24/20 0451  . heparin 1,050 Units/hr (10/24/20 0449)    Allergies:  Allergies  Allergen Reactions  . Contrast Media [Iodinated Diagnostic Agents] Shortness Of Breath    SOB  . Iohexol Shortness Of Breath     Desc: Respiratory Distress, Laryngedema, diaphoresis, Onset Date: [id number]   .  Metrizamide Shortness Of Breath    SOB SOB SOB   . Latex Rash    Social History   Socioeconomic History  . Marital status: Married    Spouse name: Not on file  . Number of children: Not on file  . Years of education: Not on file  . Highest education level: Not on file  Occupational History  . Occupation: retired  Tobacco Use  . Smoking status: Former Smoker    Packs/day: 0.25    Types: Cigarettes    Quit date: 10/14/1994  Years since quitting: 26.0  . Smokeless tobacco: Never Used  Vaping Use  . Vaping Use: Never used  Substance and Sexual Activity  . Alcohol use: No    Alcohol/week: 0.0 standard drinks  . Drug use: No  . Sexual activity: Not on file  Other Topics Concern  . Not on file  Social History Narrative   Lives at home with wife, has a wheelchair   Social Determinants of Health   Financial Resource Strain: Not on file  Food Insecurity: Not on file  Transportation Needs: Not on file  Physical Activity: Not on file  Stress: Not on file  Social Connections: Not on file  Intimate Partner Violence: Not on file     Family History  Problem Relation Age of Onset  . Hypertension Mother   . Diabetes Mother   . Prostate cancer Neg Hx   . Bladder Cancer Neg Hx   . Kidney disease Neg Hx      Review of Systems: A 12-system review of systems was performed and is negative except as noted in the HPI.  Labs: No results for input(s): CKTOTAL, CKMB, TROPONINI in the last 72 hours. Lab Results  Component Value Date   WBC 9.9 10/24/2020   HGB 13.4 10/24/2020   HCT 39.2 10/24/2020   MCV 87.7 10/24/2020   PLT 202 10/24/2020    Recent Labs  Lab 10/24/20 0059 10/24/20 0553  NA 133* 136  K 4.2 4.0  CL 97* 101  CO2 24 26  BUN 37* 34*  CREATININE 1.43* 1.35*  CALCIUM 9.0 9.0  PROT 6.9  --   BILITOT 0.5  --   ALKPHOS 132*  --   ALT 21  --   AST 21  --   GLUCOSE 501* 364*   Lab Results  Component Value Date   CHOL 154 04/08/2020   HDL 39 (L)  04/08/2020   LDLCALC 101 (H) 04/08/2020   TRIG 72 04/08/2020   Lab Results  Component Value Date   DDIMER 0.76 (H) 10/24/2020    Radiology/Studies:  DG Chest Portable 1 View  Result Date: 10/24/2020 CLINICAL DATA:  Chest pain, hypoxia, vomiting EXAM: PORTABLE CHEST 1 VIEW COMPARISON:  09/13/2020 FINDINGS: Single frontal view of the chest demonstrates a stable cardiac silhouette. No acute airspace disease, effusion, or pneumothorax. No acute bony abnormalities. IMPRESSION: 1. No acute intrathoracic process. Electronically Signed   By: Randa Ngo M.D.   On: 10/24/2020 01:25    Wt Readings from Last 3 Encounters:  10/24/20 86.3 kg  09/13/20 81.6 kg  08/02/20 88.4 kg    EKG: Normal sinus rhythm with inferior ST depression.  Physical Exam:  Blood pressure (!) 161/74, pulse 62, temperature 98.5 F (36.9 C), temperature source Oral, resp. rate 17, height 5\' 7"  (1.702 m), weight 86.3 kg, SpO2 91 %. Body mass index is 29.8 kg/m. General: Well developed, well nourished, in no acute distress. Head: Normocephalic, atraumatic, sclera non-icteric, no xanthomas, nares are without discharge.  Neck: Negative for carotid bruits. JVD not elevated. Lungs: Clear bilaterally to auscultation without wheezes, rales, or rhonchi. Breathing is unlabored. Heart: RRR with S1 S2. No murmurs, rubs, or gallops appreciated. Abdomen: Soft, non-tender, non-distended with normoactive bowel sounds. No hepatomegaly. No rebound/guarding. No obvious abdominal masses. Msk:  Strength and tone appear normal for age. Extremities: Bilateral lower extremity BKA. Neuro: Alert and oriented X 3. No facial asymmetry. No focal deficit. Moves all extremities spontaneously. Psych:  Responds to questions appropriately with a normal  affect.     Assessment and Plan   86 year old male with history of chronic A. fib anticoagulated with Xarelto, chronic kidney disease, CHF, diabetes, hypertension, hyperlipidemia, peripheral  vascular disease status post bilateral BKA who presented to the emergency room with altered mental status and chest pain.  EKG showed sinus rhythm with no ischemia.  Serum troponins were drawn which revealed a value of 95 with a subsequent value of 435.  EKG showed no ischemia.  Patient remains somewhat confused and not able to give very good history.  1.  Abnormal troponin-patent with chest pain with both typical atypical features.  His serum troponin initially was 95 with a subsequent value of 435.  Was on Xarelto which he took day prior to hospitalization.  EKG showed no acute ischemia.  We will repeat his troponin but is not a candidate for left heart cath at present.  This is secondary to renal insufficiency, comorbidity and recent anticoagulation with Xarelto which she last took the night before presentation.  We will continue with heparin remain off Xarelto and follow.  Consideration for medical management versus invasive evaluation will be determined throughout his hospital stay.  Patient remains high risk for invasive procedure.  Discussed with his primary cardiologist who agrees with plan  2.  Atrial fibrillation-currently in sinus rhythm.  Xarelto currently on hold.  Will resume this at discharge.  Remain on heparin for now.  3.  Chronic kidney disease.  His renal function appears at his baseline.  4.  Peripheral vascular disease-status post bilateral BKA.  We will continue to follow.  5.  CHF-echo several months ago revealed an EF of 45 to 50% with no high-grade valvular disease.  We will continue to follow carefully diuresing.  6.  Hypertension-continue with current regimen and follow  7.  Hyperlipidemia-continue with statin.  Signed, Teodoro Spray MD 10/24/2020, 8:08 AM Pager: [phone]]

## 2020-10-24 NOTE — Progress Notes (Signed)
Inpatient Diabetes Program Recommendations  AACE/ADA: New Consensus Statement on Inpatient Glycemic Control (2015)  Target Ranges:  Prepandial:   less than 140 mg/dL      Peak postprandial:   less than 180 mg/dL (1-2 hours)      Critically ill patients:  140 - 180 mg/dL   Lab Results  Component Value Date   GLUCAP 371 (H) 10/24/2020   HGBA1C 7.4 (H) 06/26/2020    Review of Glycemic Control  Diabetes history: DM2 Outpatient Diabetes medications: Lantus 20 units + Novolog 4 units tid meal coverage + Metformin 500 mg bid Current orders for Inpatient glycemic control: Lantus 20 units q hs + Novolog 0-20 units tid  Inpatient Diabetes Program Recommendations:   -Change Lantus dose to daily. Dr. Posey Pronto updated to daily.  Thank you, Nani Gasser. Benedict Kue, RN, MSN, CDE  Diabetes Coordinator Inpatient Glycemic Control Team Team Pager [phone] (8am-5pm) 10/24/2020 8:49 AM

## 2020-10-24 NOTE — H&P (Addendum)
[place]   PATIENT NAME: [name]    MR#:  [id number]  DATE OF BIRTH:  03/09/1935  DATE OF ADMISSION:  10/24/2020  PRIMARY CARE PHYSICIAN: Remy   Patient is coming from: Home  REQUESTING/REFERRING PHYSICIAN: Ward, Delice Bison, DO  CHIEF COMPLAINT:   Chief Complaint  Patient presents with  . Chest Pain    HISTORY OF PRESENT ILLNESS:  [name] is a 86 y.o. Caucasian male with medical history significant for osteoarthritis, atrial fibrillation, CKD, CHF, type 2 diabetes mellitus, hypertension, dyslipidemia, peripheral vascular disease status post bilateral below-knee amputations, sleep apnea on CPAP, who presented to the ER with acute onset of midsternal chest pain that was 3-4/10 in severity at significantly improved with sublingual nitroglycerin.  This happened when his wife woke him up.  He admitted to nausea and vomiting once with his chest pain.  No dyspnea or diaphoresis or palpitations.  No cough or wheezing or hemoptysis.  He denied any headache or dizziness or blurred vision.  He is not on oxygen at home. ED Course: Upon presentation to the ER respiratory rate was 21 and blood pressure 167/95 with a pulse oximetry of 89% on room air and 94% on 2 L of O2 by nasal cannula.  Labs results reveal mild hyponatremia and significant hyperglycemia of 501 with a creatinine of 1.43 and alk phos of 132.  BNP was 464.5 with high-sensitivity troponin I of 95.  CBC was within normal.  Quantitative D-dimer was 0.76.  Influenza antigens and COVID-19 PCR came back negative.   EKG as reviewed by me :EKG showed no sinus rhythm with rate of 89 with PACs Q waves anteroseptally and ST segment depression laterally and inferiorly. Imaging: Chest x-ray showed no acute cardiopulmonary disease.  The patient was given aspirin as well as 2 sublingual nitroglycerin spray, 4 mg IV Zofran and 5 units of IV NovoLog.  He was then started on IV heparin with bolus and drip.   He will be admitted to an observation progressive unit bed for further evaluation and management. PAST MEDICAL HISTORY:   Past Medical History:  Diagnosis Date  . Arthritis   . Atrial fibrillation ([place])   . Carcinoma of prostate ([place])   . CHF (congestive heart failure) ([place])   . Chronic kidney disease   . Diabetes mellitus without complication ([place])   . ED (erectile dysfunction)   . Frequent urination   . Hyperlipidemia   . Hypertension   . Moderate mitral insufficiency   . Peripheral vascular disease ([place])   . Pneumonia 04/2016  . Prostate cancer ([place])   . Sleep apnea    OSA--USE C-PAP  . Ulcer of left foot due to type 2 diabetes mellitus ([place])   . Urinary stress incontinence, male     PAST SURGICAL HISTORY:   Past Surgical History:  Procedure Laterality Date  . AMPUTATION Left 01/12/2017   Procedure: AMPUTATION BELOW KNEE;  Surgeon: Algernon Huxley, MD;  Location: [facility] ORS;  Service: General;  Laterality: Left;  . AMPUTATION Right 12/27/2018   Procedure: AMPUTATION BELOW KNEE;  Surgeon: Algernon Huxley, MD;  Location: [facility] ORS;  Service: General;  Laterality: Right;  . APLIGRAFT PLACEMENT Left 10/15/2016   Procedure: APLIGRAFT PLACEMENT;  Surgeon: Albertine Patricia, DPM;  Location: [facility]S;  Service: Podiatry;  Laterality: Left;  necrotic ulcer  . CHOLECYSTECTOMY    . COLONOSCOPY WITH PROPOFOL N/A 01/02/2019   Procedure: COLONOSCOPY WITH PROPOFOL;  Surgeon: Allen Norris,  Darren, MD;  Location: [place] ENDOSCOPY;  Service: Endoscopy;  Laterality: N/A;  . COLONOSCOPY WITH PROPOFOL N/A 01/05/2019   Procedure: COLONOSCOPY WITH PROPOFOL;  Surgeon: Lucilla Lame, MD;  Location: [facility] ENDOSCOPY;  Service: Endoscopy;  Laterality: N/A;  . ESOPHAGOGASTRODUODENOSCOPY (EGD) WITH PROPOFOL N/A 01/02/2019   Procedure: ESOPHAGOGASTRODUODENOSCOPY (EGD) WITH PROPOFOL;  Surgeon: Lucilla Lame, MD;  Location: [facility] ENDOSCOPY;  Service: Endoscopy;  Laterality: N/A;  . EYE SURGERY Bilateral    Cataract Extraction with  IOL  . HIP ARTHROPLASTY Right 10/13/2018   Procedure: ARTHROPLASTY BIPOLAR HIP (HEMIARTHROPLASTY);  Surgeon: Earnestine Leys, MD;  Location: [facility] ORS;  Service: Orthopedics;  Laterality: Right;  . IRRIGATION AND DEBRIDEMENT FOOT Left 10/15/2016   Procedure: IRRIGATION AND DEBRIDEMENT FOOT-EXCISIONAL DEBRIDEMENT OF SKIN 3RD, 4TH AND 5TH TOES WITH APPLICATION OF APLIGRAFT;  Surgeon: Albertine Patricia, DPM;  Location: [facility] ORS;  Service: Podiatry;  Laterality: Left;  necrotic,gangrene  . IRRIGATION AND DEBRIDEMENT FOOT Left 12/09/2016   Procedure: IRRIGATION AND DEBRIDEMENT FOOT-MUSCLE/FASCIA, RESECTION OF FOURTH AND FIFTH METATARSAL NECROTIC BONE;  Surgeon: Albertine Patricia, DPM;  Location: [facility] OR[facility];  Service: Podiatry;  Laterality: Left;  . IRRIGATION AND DEBRIDEMENT FOOT Right 12/23/2018   Procedure: IRRIGATION AND DEBRIDEMENT FOOT and wound vac application;  Surgeon: Samara Deist, DPM;  Location: [facility] ORS;  Service: Podiatry;  Laterality: Right;  . LEFT HEART CATH N/A 06/25/2019   Procedure: Left Heart Cath and Coronary Angiography;  Surgeon: Isaias Cowman, MD;  Location: [place] CV LAB;  Service: Cardiovascular;  Laterality: N/A;  . LEFT HEART CATH AND CORONARY ANGIOGRAPHY N/A 06/05/2018   Procedure: LEFT HEART CATH AND CORONARY ANGIOGRAPHY;  Surgeon: Yolonda Kida, MD;  Location: [place] CV LAB;  Service: Cardiovascular;  Laterality: N/A;  . LOWER EXTREMITY ANGIOGRAPHY Left 08/16/2016   Procedure: Lower Extremity Angiography;  Surgeon: Algernon Huxley, MD;  Location: [place] CV LAB;  Service: Cardiovascular;  Laterality: Left;  . LOWER EXTREMITY ANGIOGRAPHY Left 09/01/2016   Procedure: Lower Extremity Angiography;  Surgeon: Algernon Huxley, MD;  Location: [place] CV LAB;  Service: Cardiovascular;  Laterality: Left;  . LOWER EXTREMITY ANGIOGRAPHY Left 10/14/2016   Procedure: Lower Extremity Angiography;  Surgeon: Algernon Huxley, MD;  Location: [place] CV LAB;  Service:  Cardiovascular;  Laterality: Left;  . LOWER EXTREMITY ANGIOGRAPHY Left 12/20/2016   Procedure: Lower Extremity Angiography;  Surgeon: Algernon Huxley, MD;  Location: [place] CV LAB;  Service: Cardiovascular;  Laterality: Left;  . LOWER EXTREMITY ANGIOGRAPHY Right 12/18/2018   Procedure: LOWER EXTREMITY ANGIOGRAPHY;  Surgeon: Algernon Huxley, MD;  Location: [place] CV LAB;  Service: Cardiovascular;  Laterality: Right;  . LOWER EXTREMITY INTERVENTION  09/01/2016   Procedure: Lower Extremity Intervention;  Surgeon: Algernon Huxley, MD;  Location: [place] CV LAB;  Service: Cardiovascular;;  . PROSTATE SURGERY     removal  . TONSILLECTOMY     as a child    SOCIAL HISTORY:   Social History   Tobacco Use  . Smoking status: Former Smoker    Packs/day: 0.25    Types: Cigarettes    Quit date: 10/14/1994    Years since quitting: 26.0  . Smokeless tobacco: Never Used  Substance Use Topics  . Alcohol use: No    Alcohol/week: 0.0 standard drinks    FAMILY HISTORY:   Family History  Problem Relation Age of Onset  . Hypertension Mother   . Diabetes Mother   . Prostate cancer Neg Hx   . Bladder Cancer  Neg Hx   . Kidney disease Neg Hx     DRUG ALLERGIES:   Allergies  Allergen Reactions  . Contrast Media [Iodinated Diagnostic Agents] Shortness Of Breath    SOB  . Iohexol Shortness Of Breath     Desc: Respiratory Distress, Laryngedema, diaphoresis, Onset Date: 41660630   . Metrizamide Shortness Of Breath    SOB SOB SOB   . Latex Rash    REVIEW OF SYSTEMS:   ROS As per history of present illness. All pertinent systems were reviewed above. Constitutional, HEENT, cardiovascular, respiratory, GI, GU, musculoskeletal, neuro, psychiatric, endocrine, integumentary and hematologic systems were reviewed and are otherwise negative/unremarkable except for positive findings mentioned above in the HPI.   MEDICATIONS AT HOME:   Prior to Admission medications   Medication Sig Start  Date End Date Taking? Authorizing Provider  atorvastatin (LIPITOR) 80 MG tablet Take 80 mg by mouth at bedtime.    [provider]  bisacodyl (DULCOLAX) 5 MG EC tablet Take 1 tablet (5 mg total) by mouth daily as needed for moderate constipation. 04/10/20   Lorella Nimrod, MD  chlorthalidone (HYGROTON) 25 MG tablet Take 25 mg by mouth daily.    [provider]  diltiazem (CARDIZEM CD) 240 MG 24 hr capsule Take 1 capsule (240 mg total) by mouth daily. 08/05/20   Shelly Coss, MD  divalproex (DEPAKOTE) 500 MG DR tablet Take 500 mg by mouth daily.  02/18/20   [provider]  Ensure Max Protein (ENSURE MAX PROTEIN) LIQD Take 330 mLs (11 oz total) by mouth 2 (two) times daily. 04/10/20   Lorella Nimrod, MD  ferrous sulfate 325 (65 FE) MG tablet Take 325 mg by mouth daily with breakfast.    [provider]  gabapentin (NEURONTIN) 100 MG capsule Take 1 capsule (100 mg total) by mouth 3 (three) times daily. 08/05/2020   Shelly Coss, MD  Insulin Glargine (BASAGLAR KWIKPEN) 100 UNIT/ML Inject 20 Units into the skin at bedtime. 08/18/2020   Shelly Coss, MD  isosorbide mononitrate (IMDUR) 30 MG 24 hr tablet Take 60 mg by mouth daily. 11/07/19   [provider]  metFORMIN (GLUCOPHAGE) 500 MG tablet Take 500 mg by mouth 2 (two) times daily with a meal.    [provider]  metoprolol succinate (TOPROL-XL) 25 MG 24 hr tablet Take 1 tablet (25 mg total) by mouth daily. Take with or immediately following a meal. 04/10/20   Lorella Nimrod, MD  NOVOLOG FLEXPEN 100 UNIT/ML FlexPen Inject 4 Units into the skin 3 (three) times daily with meals. 04/08/20   [provider]  pantoprazole (PROTONIX) 40 MG tablet Take 1 tablet (40 mg total) by mouth at bedtime for 14 days. 08/21/2020 08/18/20  Shelly Coss, MD  Rivaroxaban (XARELTO) 15 MG TABS tablet Take 1 tablet (15 mg total) by mouth daily with supper. Hold Xarelto for 3 days.  Start on January 09, 2019. 01/09/19    [name], MD      VITAL SIGNS:  Blood pressure (!) 166/78, pulse 79, temperature 99 F (37.2 C), temperature source Oral, resp. rate 18, height _0  (1.702 m), weight 86.3 kg, SpO2 92 %.  PHYSICAL EXAMINATION:  Physical Exam  GENERAL:  86 y.o.-year-old Caucasian male patient lying in the bed with no acute distress.  EYES: Pupils equal, round, reactive to light and accommodation. No scleral icterus. Extraocular muscles intact.  HEENT: Head atraumatic, normocephalic. Oropharynx and nasopharynx clear.  NECK:  Supple, no jugular venous distention. No thyroid enlargement,  no tenderness.  LUNGS: Normal breath sounds bilaterally, no wheezing, rales,rhonchi or crepitation. No use of accessory muscles of respiration.  CARDIOVASCULAR: Regular rate and rhythm, S1, S2 normal. No murmurs, rubs, or gallops.  ABDOMEN: Soft, nondistended, nontender. Bowel sounds present. No organomegaly or mass.  EXTREMITIES: No pedal edema, cyanosis, or clubbing.  NEUROLOGIC: Cranial nerves II through XII are intact. Muscle strength 5/5 in all extremities. Sensation intact. Gait not checked.  PSYCHIATRIC: The patient is alert and oriented x 3.  Normal affect and good eye contact. SKIN: No obvious rash, lesion, or ulcer.   LABORATORY PANEL:   CBC Recent Labs  Lab 10/24/20 0059  WBC 9.9  HGB 13.4  HCT 39.2  PLT 202   ------------------------------------------------------------------------------------------------------------------  Chemistries  Recent Labs  Lab 10/24/20 0059  NA 133*  K 4.2  CL 97*  CO2 24  GLUCOSE 501*  BUN 37*  CREATININE 1.43*  CALCIUM 9.0  AST 21  ALT 21  ALKPHOS 132*  BILITOT 0.5   ------------------------------------------------------------------------------------------------------------------  Cardiac Enzymes No results for input(s): TROPONINI in the last 168  hours. ------------------------------------------------------------------------------------------------------------------  RADIOLOGY:  DG Chest Portable 1 View  Result Date: 10/24/2020 CLINICAL DATA:  Chest pain, hypoxia, vomiting EXAM: PORTABLE CHEST 1 VIEW COMPARISON:  09/13/2020 FINDINGS: Single frontal view of the chest demonstrates a stable cardiac silhouette. No acute airspace disease, effusion, or pneumothorax. No acute bony abnormalities. IMPRESSION: 1. No acute intrathoracic process. Electronically Signed   By: Randa Ngo M.D.   On: 10/24/2020 01:25      IMPRESSION AND PLAN:  Active Problems:   Chest pain  1.  Chest pain with elevated troponin I, rule out acute coronary syndrome/non-ST elevation MI. - The patient will be admitted to an observation progressive cardiac unit bed. - We will follow serial troponin I's. - We will continue the patient on IV heparin drip. - We will place him on aspirin as well as as needed sublingual nitroglycerin and morphine sulfate for pain. - He will be on high-dose statin therapy. - We will obtain a cardiology consultation for further cardiac risk ratification. - I notified Dr. Ubaldo Glassing about the patient.  2.  Elevated D-dimer with acute hypoxic respiratory failure. - We will obtain a VQ scan to rule out PE. - The patient will be on IV heparin as mentioned above.  3.  Essential hypertension. - We will continue Cardizem CD and hold off chlorthalidone. - We will place the patient on as needed IV labetalol.  4.  Type 2 diabetes mellitus with peripheral neuropathy. - The patient will be placed on supplement coverage with NovoLog and will continue basal coverage. - We will continue Neurontin and hold off metformin.  5.  GERD. - The patient will be continued on PPI therapy.  5.  Paroxysmal atrial fibrillation. - The patient will be on IV heparin as mentioned above.  DVT prophylaxis: IV heparin drip. Code Status: The patient is DNR/DNI.    Family Communication:  The plan of care was discussed in details with the patient (who requested no other family members to be notified at this time.). I answered all questions. The patient agreed to proceed with the above mentioned plan. Further management will depend upon hospital course. Disposition Plan: Back to previous home environment Consults called: Cardiology. All the records are reviewed and case discussed with ED provider.  Status is: Observation  The patient remains OBS appropriate and will d/c before 2 midnights.  Dispo: The patient is from: Home  Anticipated d/c is to: Home              Patient currently is not medically stable to d/c.   Difficult to place patient No    TOTAL TIME TAKING CARE OF THIS PATIENT: 55 minutes.    Christel Mormon M.D on 10/24/2020 at 4:54 AM  [facility]   From 7 PM-7 AM, contact night-coverage [email or web address]  CC: Primary care physician; [place]

## 2020-10-24 NOTE — Progress Notes (Signed)
Patient seen an exam in this morning. Received Xarelto yesterday. This would preclude left heart Cath today. Also not a candidate for cardiac catheterization present given comorbidities. Would remain on heparin. Will follow. Discussed with primary cardiologist. Full note to follow.

## 2020-10-24 NOTE — Progress Notes (Signed)
[place] for heparin infusion Indication: chest pain/ACS/STEMI  Allergies  Allergen Reactions  . Contrast Media [Iodinated Diagnostic Agents] Shortness Of Breath    SOB  . Iohexol Shortness Of Breath     Desc: Respiratory Distress, Laryngedema, diaphoresis, Onset Date: [id number]   . Metrizamide Shortness Of Breath    SOB SOB SOB   . Latex Rash    Patient Measurements: Height: 5\' 7"  (170.2 cm) Weight: 86.3 kg (190 lb 4.1 oz) IBW/kg (Calculated) : 66.1 Heparin Dosing Weight: 81.6 kg  Vital Signs: Temp: 98.2 F (36.8 C) (06/03 1134) Temp Source: Oral (06/03 1134) BP: 142/54 (06/03 1134) Pulse Rate: 61 (06/03 1134)  Labs: Recent Labs    10/24/20 0059 10/24/20 0553 10/24/20 1413  HGB 13.4  --   --   HCT 39.2  --   --   PLT 202  --   --   APTT  --  31  --   LABPROT  --  15.3*  --   INR  --  1.2  --   HEPARINUNFRC  --  0.25* 0.19*  CREATININE 1.43* 1.35*  --   TROPONINIHS 95* 435*  --     Estimated Creatinine Clearance: 43.5 mL/min (A) (by C-G formula based on SCr of 1.35 mg/dL (H)).   Medical History: Past Medical History:  Diagnosis Date  . Arthritis   . Atrial fibrillation ([place])   . Carcinoma of prostate ([place])   . CHF (congestive heart failure) ([place])   . Chronic kidney disease   . Diabetes mellitus without complication ([place])   . ED (erectile dysfunction)   . Frequent urination   . Hyperlipidemia   . Hypertension   . Moderate mitral insufficiency   . Peripheral vascular disease ([place])   . Pneumonia 04/2016  . Prostate cancer ([name])   . Sleep apnea    OSA--USE C-PAP  . Ulcer of left foot due to type 2 diabetes mellitus ([place])   . Urinary stress incontinence, male     Medications:  PTA meds include Xarelto 15 mg daily  Assessment:  Pt is a 86 y.o. male history of a fib, CKD, CHF, HTN, diabetes, hyperlipidemia who presents to the emergency department via EMS w/ c/o acute onset of left-sided chest pain and  vomiting.  Per MD pt is a  poor historian. Rivaroxaban PTA but heparin level was not falsely elevated. Will use heparin level for monitoring.   6/3 1413 HL 0.19   Goal of Therapy:  Heparin level 0.3-0.7 units/ml Monitor platelets by anticoagulation protocol: Yes    Plan:  Heparin level is subtherapeutic. Will give 2400 units bolus x 1 and increase heparin infusion to 1300 units/hr. Recheck heparin level in 8 hours. CBC daily while on heparin.   Eleonore Chiquito, PharmD, BCPS 10/24/2020 3:29 PM

## 2020-10-24 NOTE — Progress Notes (Signed)
[place] for heparin infusion Indication: chest pain/ACS/STEMI  Allergies  Allergen Reactions  . Contrast Media [Iodinated Diagnostic Agents] Shortness Of Breath    SOB  . Iohexol Shortness Of Breath     Desc: Respiratory Distress, Laryngedema, diaphoresis, Onset Date: [id number]   . Metrizamide Shortness Of Breath    SOB SOB SOB   . Latex Rash    Patient Measurements: Height: 5\' 7"  (170.2 cm) Weight: 86.3 kg (190 lb 4.1 oz) IBW/kg (Calculated) : 66.1 Heparin Dosing Weight: 81.6 kg  Vital Signs: Temp: 97.8 F (36.6 C) (06/03 2109) Temp Source: Oral (06/03 2109) BP: 137/67 (06/03 2109) Pulse Rate: 53 (06/03 2109)  Labs: Recent Labs    10/24/20 0059 10/24/20 0553 10/24/20 1413 10/24/20 2204  HGB 13.4  --   --   --   HCT 39.2  --   --   --   PLT 202  --   --   --   APTT  --  31  --   --   LABPROT  --  15.3*  --   --   INR  --  1.2  --   --   HEPARINUNFRC  --  0.25* 0.19* 0.48  CREATININE 1.43* 1.35*  --   --   TROPONINIHS 95* 435*  --   --     Estimated Creatinine Clearance: 43.5 mL/min (A) (by C-G formula based on SCr of 1.35 mg/dL (H)).   Medical History: Past Medical History:  Diagnosis Date  . Arthritis   . Atrial fibrillation ([place])   . Carcinoma of prostate ([place])   . CHF (congestive heart failure) ([place])   . Chronic kidney disease   . Diabetes mellitus without complication ([place])   . ED (erectile dysfunction)   . Frequent urination   . Hyperlipidemia   . Hypertension   . Moderate mitral insufficiency   . Peripheral vascular disease ([place])   . Pneumonia 04/2016  . Prostate cancer ([place])   . Sleep apnea    OSA--USE C-PAP  . Ulcer of left foot due to type 2 diabetes mellitus ([place])   . Urinary stress incontinence, male     Medications:  PTA meds include Xarelto 15 mg daily  Assessment:  Pt is a 86 y.o. male history of a fib, CKD, CHF, HTN, diabetes, hyperlipidemia who presents to the emergency department  via EMS w/ c/o acute onset of left-sided chest pain and vomiting.  Per MD pt is a  poor historian. Rivaroxaban PTA but heparin level was not falsely elevated. Will use heparin level for monitoring.   6/3 1413 HL 0.19  6/3 2204 HL 0.48, therapeutic x 1  Goal of Therapy:  Heparin level 0.3-0.7 units/ml Monitor platelets by anticoagulation protocol: Yes    Plan:  Will continue heparin infusion at 1300 units/hr. Recheck heparin level in 8 hours. CBC daily while on heparin.   Renda Rolls, PharmD, [facility] 10/24/2020 11:04 PM

## 2020-10-25 DIAGNOSIS — E118 Type 2 diabetes mellitus with unspecified complications: Secondary | ICD-10-CM

## 2020-10-25 DIAGNOSIS — I10 Essential (primary) hypertension: Secondary | ICD-10-CM

## 2020-10-25 DIAGNOSIS — I480 Paroxysmal atrial fibrillation: Secondary | ICD-10-CM

## 2020-10-25 DIAGNOSIS — R079 Chest pain, unspecified: Secondary | ICD-10-CM

## 2020-10-25 LAB — TROPONIN I (HIGH SENSITIVITY): Troponin I (High Sensitivity): 216 ng/L (ref <18)

## 2020-10-25 LAB — CBC
HCT: 38 % — ABNORMAL LOW (ref 39.0–52.0)
Hemoglobin: 13.1 g/dL (ref 13.0–17.0)
MCH: 30.1 pg (ref 26.0–34.0)
MCHC: 34.5 g/dL (ref 30.0–36.0)
MCV: 87.4 fL (ref 80.0–100.0)
Platelets: 185 10*3/uL (ref 150–400)
RBC: 4.35 MIL/uL (ref 4.22–5.81)
RDW: 13.4 % (ref 11.5–15.5)
WBC: 10.1 10*3/uL (ref 4.0–10.5)
nRBC: 0 % (ref 0.0–0.2)

## 2020-10-25 LAB — GLUCOSE, CAPILLARY
Glucose-Capillary: 200 mg/dL — ABNORMAL HIGH (ref 70–99)
Glucose-Capillary: 254 mg/dL — ABNORMAL HIGH (ref 70–99)

## 2020-10-25 LAB — HEPARIN LEVEL (UNFRACTIONATED): Heparin Unfractionated: 0.54 IU/mL (ref 0.30–0.70)

## 2020-10-25 MED ORDER — ISOSORBIDE MONONITRATE ER 120 MG PO TB24: 120.0000 mg | ORAL_TABLET | Freq: Every day | ORAL | 0 refills | Status: DC

## 2020-10-25 MED ORDER — ISOSORBIDE MONONITRATE ER 60 MG PO TB24: 120.0000 mg | ORAL_TABLET | Freq: Every day | ORAL | Status: DC

## 2020-10-25 MED ORDER — NITROGLYCERIN 0.4 MG SL SUBL: 0.4000 mg | SUBLINGUAL_TABLET | SUBLINGUAL | 12 refills | Status: DC | PRN

## 2020-10-25 NOTE — Progress Notes (Signed)
Patient seen and examined. Denies chest pain. He's doing well from a cardiac standpoint. I feel he is ready to go home. With discharge on current regimen with consideration for increasing the isosorbide mono nitrate to 120 mg daily. He can take two of what he has at home. I will arrange follow up with Dr. Nehemiah Massed next week.. Full note to follow. Again I feel it is OK for him to be discharged today from a cardiac standpoint.

## 2020-10-25 NOTE — Progress Notes (Signed)
Patient concerned about missing black watch this am. However dr patel verified with wife that she does have patients watch at home.

## 2020-10-25 NOTE — Progress Notes (Signed)
[name] to be D/C'd Home per MD order.  Discussed prescriptions and follow up appointments with the patient and son. Prescription electronically submitted, medication list explained in detail. Pt and son  verbalized understanding.  Allergies as of 10/25/2020      Reactions   Contrast Media [iodinated Diagnostic Agents] Shortness Of Breath   SOB   Iohexol Shortness Of Breath    Desc: Respiratory Distress, Laryngedema, diaphoresis, Onset Date: [id number]   Metrizamide Shortness Of Breath   SOB SOB SOB   Latex Rash      Medication List    STOP taking these medications   gabapentin 100 MG capsule Commonly known as: NEURONTIN     TAKE these medications   atorvastatin 80 MG tablet Commonly known as: LIPITOR Take 80 mg by mouth at bedtime.   Basaglar KwikPen 100 UNIT/ML Inject 20 Units into the skin at bedtime.   bisacodyl 5 MG EC tablet Commonly known as: DULCOLAX Take 1 tablet (5 mg total) by mouth daily as needed for moderate constipation.   chlorthalidone 25 MG tablet Commonly known as: HYGROTON Take 25 mg by mouth daily.   diltiazem 240 MG 24 hr capsule Commonly known as: CARDIZEM CD Take 1 capsule (240 mg total) by mouth daily.   divalproex 500 MG DR tablet Commonly known as: DEPAKOTE Take 500 mg by mouth daily.   Ensure Max Protein Liqd Take 330 mLs (11 oz total) by mouth 2 (two) times daily.   ferrous sulfate 325 (65 FE) MG tablet Take 325 mg by mouth daily with breakfast.   isosorbide mononitrate 120 MG 24 hr tablet Commonly known as: IMDUR Take 1 tablet (120 mg total) by mouth daily. Start taking on: October 26, 2020 What changed:   medication strength  how much to take   metFORMIN 500 MG tablet Commonly known as: GLUCOPHAGE Take 500 mg by mouth 2 (two) times daily with a meal.   metoprolol succinate 25 MG 24 hr tablet Commonly known as: TOPROL-XL Take 1 tablet (25 mg total) by mouth daily. Take with or immediately following a meal.    nitroGLYCERIN 0.4 MG SL tablet Commonly known as: NITROSTAT Place 1 tablet (0.4 mg total) under the tongue every 5 (five) minutes as needed for chest pain.   NovoLOG FlexPen 100 UNIT/ML FlexPen Generic drug: insulin aspart Inject 4 Units into the skin 3 (three) times daily with meals.   pantoprazole 40 MG tablet Commonly known as: PROTONIX Take 1 tablet (40 mg total) by mouth at bedtime for 14 days.   Rivaroxaban 15 MG Tabs tablet Commonly known as: XARELTO Take 1 tablet (15 mg total) by mouth daily with supper. Hold Xarelto for 3 days.  Start on January 09, 2019.       Vitals:   10/25/20 0757 10/25/20 1207  BP: (!) 162/94 (!) 153/80  Pulse: 67 61  Resp:    Temp: 98.1 F (36.7 C) 97.7 F (36.5 C)  SpO2: 97% 94%    Skin clean, dry and intact without evidence of skin break down, no evidence of skin tears noted. IV catheter discontinued intact. Site without signs and symptoms of complications. Dressing and pressure applied. Pt denies pain at this time. No complaints noted.  An After Visit Summary was printed and given to the patient. Patient escorted via [place], and D/C home via private auto.  [place]

## 2020-10-25 NOTE — Plan of Care (Signed)
Pt unable to understand that cannot dial out on room phone to call to his home, it is considered a long distance number. Instead Rn went to nursing station and called wife at home and then transferred wife into room, pt then satisfied.

## 2020-10-25 NOTE — Progress Notes (Signed)
[facility] for heparin infusion Indication: chest pain/ACS/STEMI  Allergies  Allergen Reactions  . Contrast Media [Iodinated Diagnostic Agents] Shortness Of Breath    SOB  . Iohexol Shortness Of Breath     Desc: Respiratory Distress, Laryngedema, diaphoresis, Onset Date: [id number]   . Metrizamide Shortness Of Breath    SOB SOB SOB   . Latex Rash    Patient Measurements: Height: 5\' 7"  (170.2 cm) Weight: 89.2 kg (196 lb 10.4 oz) IBW/kg (Calculated) : 66.1 Heparin Dosing Weight: 81.6 kg  Vital Signs: Temp: 98.3 F (36.8 C) (06/04 0357) Temp Source: Oral (06/04 0357) BP: 137/66 (06/04 0357) Pulse Rate: 51 (06/04 0357)  Labs: Recent Labs    10/24/20 0059 10/24/20 0553 10/24/20 0553 10/24/20 1413 10/24/20 2204 10/25/20 0531  HGB 13.4  --   --   --   --  13.1  HCT 39.2  --   --   --   --  38.0*  PLT 202  --   --   --   --  185  APTT  --  31  --   --   --   --   LABPROT  --  15.3*  --   --   --   --   INR  --  1.2  --   --   --   --   HEPARINUNFRC  --  0.25*   < > 0.19* 0.48 0.54  CREATININE 1.43* 1.35*  --   --   --   --   TROPONINIHS 95* 435*  --   --   --  216*   < > = values in this interval not displayed.    Estimated Creatinine Clearance: 44.2 mL/min (A) (by C-G formula based on SCr of 1.35 mg/dL (H)).   Medical History: Past Medical History:  Diagnosis Date  . Arthritis   . Atrial fibrillation ([place])   . Carcinoma of prostate ([place])   . CHF (congestive heart failure) ([place])   . Chronic kidney disease   . Diabetes mellitus without complication ([place])   . ED (erectile dysfunction)   . Frequent urination   . Hyperlipidemia   . Hypertension   . Moderate mitral insufficiency   . Peripheral vascular disease ([place])   . Pneumonia 04/2016  . Prostate cancer ([place])   . Sleep apnea    OSA--USE C-PAP  . Ulcer of left foot due to type 2 diabetes mellitus ([place])   . Urinary stress incontinence, male     Medications:  PTA  meds include Xarelto 15 mg daily  Assessment:  Pt is a 86 y.o. male history of a fib, CKD, CHF, HTN, diabetes, hyperlipidemia who presents to the emergency department via EMS w/ c/o acute onset of left-sided chest pain and vomiting.  Per MD pt is a  poor historian. Rivaroxaban PTA but heparin level was not falsely elevated. Will use heparin level for monitoring.   6/3 1413 HL 0.19  6/3 2204 HL 0.48, therapeutic x 1 6/4 0531 HL 0.54, therapeutic x 2  Goal of Therapy:  Heparin level 0.3-0.7 units/ml Monitor platelets by anticoagulation protocol: Yes    Plan:  Will continue heparin infusion at 1300 units/hr. Recheck heparin level daily with AM labs. CBC daily while on heparin.   Renda Rolls, PharmD, [facility] 10/25/2020 6:35 AM

## 2020-10-25 NOTE — Discharge Summary (Addendum)
[facility] NAME: [name]    MR#:  [id number]  DATE OF BIRTH:  [birth year]  DATE OF ADMISSION:  10/24/2020 ADMITTING PHYSICIAN: Christel Mormon, MD  DATE OF DISCHARGE: 10/25/2020  PRIMARY CARE PHYSICIAN: Ezequiel Kayser, MD    ADMISSION DIAGNOSIS:  Hyperglycemia [R73.9] Acute respiratory failure with hypoxia ([place]) [J96.01] Chest pain [R07.9] Nonspecific chest pain [R07.9]  DISCHARGE DIAGNOSIS:  Unstable angina--cont medical management  SECONDARY DIAGNOSIS:   Past Medical History:  Diagnosis Date  . Arthritis   . Atrial fibrillation ([place])   . Carcinoma of prostate ([place])   . CHF (congestive heart failure) ([place])   . Chronic kidney disease   . Diabetes mellitus without complication ([place])   . ED (erectile dysfunction)   . Frequent urination   . Hyperlipidemia   . Hypertension   . Moderate mitral insufficiency   . Peripheral vascular disease ([place])   . Pneumonia 04/2016  . Prostate cancer ([place])   . Sleep apnea    OSA--USE C-PAP  . Ulcer of left foot due to type 2 diabetes mellitus ([place])   . Urinary stress incontinence, male     HOSPITAL COURSE:  [name] a 86 y.o.Caucasian malewith medical history significant forosteoarthritis, atrial fibrillation, CKD, CHF, type 2 diabetes mellitus, hypertension, dyslipidemia, peripheral vascular disease status post bilateral below-knee amputations, sleep apnea on CPAP, who presented to the ER with acute onset of midsternal chest pain that was 3-4/10 in severity at significantly improved with sublingual nitroglycerin.  Unstable angina/mild elevated troponin/history of CAD -- IV heparin drip--now d/ced -- patient is chest pain-free -- seen by Dr. Ubaldo Glassing continue medical management -- continue aspirin, statins,BB, Imdur (increased to 120 mg qd), prn nitro  Elevated D dimer -- VQ scan negative  Type II diabetes with peripheral neuropathy -- continue SSI and lantus  for now -- on metformin, insulin at home will resume at discharge  Paroxysmal atrial fibrillation -- continue  Cardizem CD  Gerd  --on PPI   Procedures: Family communication : wife on the phone Consults : cardiology Dr. Ubaldo Glassing CODE STATUS: DNR DVT Prophylaxis : heparin drip Level of care: Progressive Cardiac Status is: Observation   Dispo: The patient is from: Home  Anticipated d/c is to: Home  Patient currently is  medically stable to d/c.              Difficult to place patient No patient clinically at baseline. Will discharged to home. Patient is in agreement. Discussed with wife. Dr. Ubaldo Glassing is aware of patient discharging CONSULTS OBTAINED:    DRUG ALLERGIES:   Allergies  Allergen Reactions  . Contrast Media [Iodinated Diagnostic Agents] Shortness Of Breath    SOB  . Iohexol Shortness Of Breath     Desc: Respiratory Distress, Laryngedema, diaphoresis, Onset Date: [id number]   . Metrizamide Shortness Of Breath    SOB SOB SOB   . Latex Rash    DISCHARGE MEDICATIONS:   Allergies as of 10/25/2020      Reactions   Contrast Media [iodinated Diagnostic Agents] Shortness Of Breath   SOB   Iohexol Shortness Of Breath    Desc: Respiratory Distress, Laryngedema, diaphoresis, Onset Date: [id number]   Metrizamide Shortness Of Breath   SOB SOB SOB   Latex Rash      Medication List    STOP taking these medications   gabapentin 100 MG capsule Commonly known as: NEURONTIN     TAKE these medications  atorvastatin 80 MG tablet Commonly known as: LIPITOR Take 80 mg by mouth at bedtime.   Basaglar KwikPen 100 UNIT/ML Inject 20 Units into the skin at bedtime.   bisacodyl 5 MG EC tablet Commonly known as: DULCOLAX Take 1 tablet (5 mg total) by mouth daily as needed for moderate constipation.   chlorthalidone 25 MG tablet Commonly known as: HYGROTON Take 25 mg by mouth daily.   diltiazem 240 MG 24 hr capsule Commonly known as:  CARDIZEM CD Take 1 capsule (240 mg total) by mouth daily.   divalproex 500 MG DR tablet Commonly known as: DEPAKOTE Take 500 mg by mouth daily.   Ensure Max Protein Liqd Take 330 mLs (11 oz total) by mouth 2 (two) times daily.   ferrous sulfate 325 (65 FE) MG tablet Take 325 mg by mouth daily with breakfast.   isosorbide mononitrate 120 MG 24 hr tablet Commonly known as: IMDUR Take 1 tablet (120 mg total) by mouth daily. Start taking on: October 26, 2020 What changed:   medication strength  how much to take   metFORMIN 500 MG tablet Commonly known as: GLUCOPHAGE Take 500 mg by mouth 2 (two) times daily with a meal.   metoprolol succinate 25 MG 24 hr tablet Commonly known as: TOPROL-XL Take 1 tablet (25 mg total) by mouth daily. Take with or immediately following a meal.   nitroGLYCERIN 0.4 MG SL tablet Commonly known as: NITROSTAT Place 1 tablet (0.4 mg total) under the tongue every 5 (five) minutes as needed for chest pain.   NovoLOG FlexPen 100 UNIT/ML FlexPen Generic drug: insulin aspart Inject 4 Units into the skin 3 (three) times daily with meals.   pantoprazole 40 MG tablet Commonly known as: PROTONIX Take 1 tablet (40 mg total) by mouth at bedtime for 14 days.   Rivaroxaban 15 MG Tabs tablet Commonly known as: XARELTO Take 1 tablet (15 mg total) by mouth daily with supper. Hold Xarelto for 3 days.  Start on January 09, 2019.       If you experience worsening of your admission symptoms, develop shortness of breath, life threatening emergency, suicidal or homicidal thoughts you must seek medical attention immediately by calling 911 or calling your MD immediately  if symptoms less severe.  You Must read complete instructions/literature along with all the possible adverse reactions/side effects for all the Medicines you take and that have been prescribed to you. Take any new Medicines after you have completely understood and accept all the possible adverse  reactions/side effects.   Please note  You were cared for by a hospitalist during your hospital stay. If you have any questions about your discharge medications or the care you received while you were in the hospital after you are discharged, you can call the unit and asked to speak with the hospitalist on call if the hospitalist that took care of you is not available. Once you are discharged, your primary care physician will handle any further medical issues. Please note that NO REFILLS for any discharge medications will be authorized once you are discharged, as it is imperative that you return to your primary care physician (or establish a relationship with a primary care physician if you do not have one) for your aftercare needs so that they can reassess your need for medications and monitor your lab values. Today   SUBJECTIVE   No cp  VITAL SIGNS:  Blood pressure (!) 153/80, pulse 61, temperature 97.7 F (36.5 C), temperature source Oral, resp. rate Marland Kitchen)  21, height 5\' 7"  (1.702 m), weight 89.2 kg, SpO2 94 %.  I/O:    Intake/Output Summary (Last 24 hours) at 10/25/2020 1428 Last data filed at 10/25/2020 1350 Gross per 24 hour  Intake 718.99 ml  Output --  Net 718.99 ml    PHYSICAL EXAMINATION:  GENERAL:  86 y.o.-year-old patient lying in the bed with no acute distress.  LUNGS: Normal breath sounds bilaterally, no wheezing, rales,rhonchi or crepitation. No use of accessory muscles of respiration.  CARDIOVASCULAR: S1, S2 normal. No murmurs, rubs, or gallops.  ABDOMEN: Soft, non-tender, non-distended. Bowel sounds present. No organomegaly or mass.  EXTREMITIES: bilateral BKA NEUROLOGIC: Cranial nerves II through XII are intact. Muscle strength 5/5 in all extremities. Sensation intact. Gait not checked.  PSYCHIATRIC: The patient is alert and oriented x 3.  SKIN: No obvious rash, lesion, or ulcer.   DATA REVIEW:   CBC  Recent Labs  Lab 10/25/20 0531  WBC 10.1  HGB 13.1  HCT 38.0*   PLT 185    Chemistries  Recent Labs  Lab 10/24/20 0059 10/24/20 0553  NA 133* 136  K 4.2 4.0  CL 97* 101  CO2 24 26  GLUCOSE 501* 364*  BUN 37* 34*  CREATININE 1.43* 1.35*  CALCIUM 9.0 9.0  AST 21  --   ALT 21  --   ALKPHOS 132*  --   BILITOT 0.5  --     Microbiology Results   Recent Results (from the past 240 hour(s))  Resp Panel by RT-PCR (Flu A&B, Covid) Nasopharyngeal Swab     Status: None   Collection Time: 10/24/20  2:11 AM   Specimen: Nasopharyngeal Swab; Nasopharyngeal(NP) swabs in vial transport medium  Result Value Ref Range Status   SARS Coronavirus 2 by RT PCR NEGATIVE NEGATIVE Final    Comment: (NOTE) SARS-CoV-2 target nucleic acids are NOT DETECTED.  The SARS-CoV-2 RNA is generally detectable in upper respiratory specimens during the acute phase of infection. The lowest concentration of SARS-CoV-2 viral copies this assay can detect is 138 copies/mL. A negative result does not preclude SARS-Cov-2 infection and should not be used as the sole basis for treatment or other patient management decisions. A negative result may occur with  improper specimen collection/handling, submission of specimen other than nasopharyngeal swab, presence of viral mutation(s) within the areas targeted by this assay, and inadequate number of viral copies(<138 copies/mL). A negative result must be combined with clinical observations, patient history, and epidemiological information. The expected result is Negative.  Fact Sheet for Patients:  EntrepreneurPulse.com.au  Fact Sheet for Healthcare Providers:  IncredibleEmployment.be  This test is no t yet approved or cleared by the [place] FDA and  has been authorized for detection and/or diagnosis of SARS-CoV-2 by FDA under an Emergency Use Authorization (EUA). This EUA will remain  in effect (meaning this test can be used) for the duration of the COVID-19 declaration under Section  564(b)(1) of the Act, 21 U.S.C.section 360bbb-3(b)(1), unless the authorization is terminated  or revoked sooner.       Influenza A by PCR NEGATIVE NEGATIVE Final   Influenza B by PCR NEGATIVE NEGATIVE Final    Comment: (NOTE) The Xpert Xpress SARS-CoV-2/FLU/RSV plus assay is intended as an aid in the diagnosis of influenza from Nasopharyngeal swab specimens and should not be used as a sole basis for treatment. Nasal washings and aspirates are unacceptable for Xpert Xpress SARS-CoV-2/FLU/RSV testing.  Fact Sheet for Patients: EntrepreneurPulse.com.au  Fact Sheet for Healthcare Providers: IncredibleEmployment.be  This test is not yet approved or cleared by the [place] and has been authorized for detection and/or diagnosis of SARS-CoV-2 by FDA under an Emergency Use Authorization (EUA). This EUA will remain in effect (meaning this test can be used) for the duration of the COVID-19 declaration under Section 564(b)(1) of the Act, 21 U.S.C. section 360bbb-3(b)(1), unless the authorization is terminated or revoked.  Performed at [facility], [address], [place], [place] [address]     RADIOLOGY:  [facility] [facility]  Result Date: 10/24/2020 CLINICAL DATA:  Respiratory failure.  Recent chest pain EXAM: NUCLEAR MEDICINE PERFUSION LUNG SCAN TECHNIQUE: Perfusion images were obtained in multiple projections after intravenous injection of radiopharmaceutical. Views: Anterior posterior, left lateral, right lateral, RPO, LPO, RAO, open. RADIOPHARMACEUTICALS:  4.09 mCi Tc-65m MAA IV COMPARISON:  Chest radiograph October 24, 2020 FINDINGS: Radiotracer uptake is homogeneous and symmetric bilaterally. No perfusion defects evident. IMPRESSION: No perfusion defects evident. No findings indicative of pulmonary embolus. Electronically Signed   By: Lowella Grip III M.D.   On: 10/24/2020 14:01   DG Chest Portable 1 View  Result Date:  10/24/2020 CLINICAL DATA:  Chest pain, hypoxia, vomiting EXAM: PORTABLE CHEST 1 VIEW COMPARISON:  09/13/2020 FINDINGS: Single frontal view of the chest demonstrates a stable cardiac silhouette. No acute airspace disease, effusion, or pneumothorax. No acute bony abnormalities. IMPRESSION: 1. No acute intrathoracic process. Electronically Signed   By: Randa Ngo M.D.   On: 10/24/2020 01:25     CODE STATUS:     Code Status Orders  (From admission, onward)         Start     Ordered   10/24/20 0247  Do not attempt resuscitation (DNR)  Continuous       Question Answer Comment  In the event of cardiac or respiratory ARREST Do not call a "code blue"   In the event of cardiac or respiratory ARREST Do not perform Intubation, CPR, defibrillation or ACLS   In the event of cardiac or respiratory ARREST Use medication by any route, position, wound care, and other measures to relive pain and suffering. May use oxygen, suction and manual treatment of airway obstruction as needed for comfort.   Comments spoke with son, [name] who confirmed DNR      10/24/20 0246        Code Status History    Date Active Date Inactive Code Status Order ID Comments User Context   07/28/2020 2052 08/05/2020 2357 DNR [id number]  Athena Masse, MD ED   06/25/2020 2321 06/26/2020 2354 DNR [id number]  Athena Masse, MD ED   06/25/2020 2258 06/25/2020 2321 DNR [id number]  Athena Masse, MD ED   04/08/2020 1628 04/11/2020 0019 Full Code [id number]  Para Skeans, MD ED   04/08/2020 1623 04/08/2020 1628 DNR [id number]  Para Skeans, MD ED   04/08/2020 1548 04/08/2020 1623 Full Code [id number]  Para Skeans, MD ED   03/06/2020 0440 03/08/2020 2104 DNR [id number]  Athena Masse, MD ED   12/04/2019 1742 12/11/2019 1943 DNR [id number]  Flora Lipps, MD Inpatient   11/30/2019 0321 12/04/2019 1742 Full Code [id number]  Bradly Bienenstock, NP ED   06/24/2019 0620 06/26/2019 1921 Full Code [id number]  Mansy, Arvella Merles, MD ED   02/08/2019 1637 02/19/2019  1719 Full Code [id number]  Bonnielee Haff, MD Inpatient   12/20/2018 1009 01/05/2019 2212 Full Code [id number]  [name], MD ED   12/18/2018 1600 12/18/2018 2113 Full Code  [id number]  Algernon Huxley, MD Inpatient   10/13/2018 1145 10/18/2018 1443 Full Code [id number]  [name] Mandes, MD Inpatient   06/03/2018 0301 06/09/2018 2013 Full Code [id number]  Lance Coon, MD Inpatient   01/16/2018 1515 01/18/2018 1635 Full Code [id number]  Dustin Flock, MD ED   11/23/2017 1346 11/28/2017 2020 Full Code [id number]  Nicholes Mango, MD Inpatient   10/26/2017 1534 10/28/2017 1854 Full Code [id number]  Bettey Costa, MD Inpatient   10/26/2017 1437 10/26/2017 1534 Full Code [id number]  Feliberto Gottron, RN Inpatient   10/24/2017 1152 10/26/2017 1437 DNR [id number]  Bettey Costa, MD Inpatient   10/15/2017 0309 10/24/2017 1152 Full Code [id number]  Arta Silence, MD Inpatient   02/10/2017 1904 02/12/2017 1908 Full Code [id number]  Delana Meyer, Dolores Lory, MD ED   02/10/2017 1638 02/10/2017 1904 Full Code [id number]  Sela Hua, PA-C Outpatient   01/08/2017 1342 01/19/2017 2103 Full Code [id number]  Loletha Grayer, MD ED   12/29/2016 2232 12/30/2016 1634 Full Code [id number]  Lance Coon, MD ED   12/20/2016 1251 12/20/2016 1842 Full Code [id number]  Algernon Huxley, MD Inpatient   10/22/2016 0040 10/24/2016 2058 Full Code [id number]  Lance Coon, MD Inpatient   10/14/2016 1201 10/14/2016 1925 Full Code [id number]  Algernon Huxley, MD Inpatient   08/30/2016 1338 09/06/2016 1941 Full Code [id number]  Epifanio Lesches, MD Inpatient   05/19/2016 0454 05/22/2016 1907 Full Code [id number]  Saundra Shelling, MD Inpatient   Advance Care Planning Activity       TOTAL TIME TAKING CARE OF THIS PATIENT: *35* minutes.    [name] Mandes M.D  [facility]    CC: Primary care physician; Ezequiel Kayser, MD

## 2020-10-25 NOTE — TOC Progression Note (Signed)
Transition of Care [facility]) - Progression Note    Patient Details  Name: [name] MRN: [id number] Date of Birth: 12-20-1934  Transition of Care [facility]) CM/SW Contact  Izola Price, RN Phone Number: 10/25/2020, 10:38 AM  Clinical Narrative:  Patient in OBS status for Chest Pain at 32 hours. DC orders written at 10:13 10/25/20. Discharge to Home/Self Care. Provider spoke with spouse this am via phone per provider notes. Medically stable for DC per provider notes. Simmie Davies RN CM [phone]         Expected Discharge Plan and Services           Expected Discharge Date: 10/25/20                                     Social Determinants of Health (SDOH) Interventions    Readmission Risk Interventions Readmission Risk Prevention Plan 03/07/2020 02/12/2019 12/25/2018  Transportation Screening Complete - Complete  PCP or Specialist Appt within 3-5 Days Complete - Complete  HRI or Home Care Consult Complete - Complete  Social Work Consult for [place] Planning/Counseling Complete - Complete  Palliative Care Screening Not Applicable - Not Applicable  Medication Review Press photographer) Complete - Complete  PCP or Specialist appointment within 3-5 days of discharge - Not Complete -  PCP/Specialist Appt Not Complete comments - not ready for dc, family requests SNF placement -  HRI or Home Care Consult - Complete -  SW Recovery Care/Counseling Consult - Complete -  Palliative Care Screening - Not Applicable -  [place] - Complete -  Some recent data might be hidden

## 2020-11-09 ENCOUNTER — Observation Stay
Admission: EM | Admit: 2020-11-09 | Discharge: 2020-11-10 | Disposition: A | Discharge status: Home / Self Care | Payer: Medicare Other | Attending: Internal Medicine | Admitting: Internal Medicine

## 2020-11-09 ENCOUNTER — Emergency Department: Payer: Medicare Other

## 2020-11-09 ENCOUNTER — Other Ambulatory Visit: Payer: Self-pay

## 2020-11-09 DIAGNOSIS — Z79899 Other long term (current) drug therapy: Secondary | ICD-10-CM | POA: Insufficient documentation

## 2020-11-09 DIAGNOSIS — I129 Hypertensive chronic kidney disease with stage 1 through stage 4 chronic kidney disease, or unspecified chronic kidney disease: Principal | ICD-10-CM | POA: Insufficient documentation

## 2020-11-09 DIAGNOSIS — Z7901 Long term (current) use of anticoagulants: Secondary | ICD-10-CM | POA: Diagnosis not present

## 2020-11-09 DIAGNOSIS — I480 Paroxysmal atrial fibrillation: Secondary | ICD-10-CM | POA: Diagnosis not present

## 2020-11-09 DIAGNOSIS — Z7984 Long term (current) use of oral hypoglycemic drugs: Secondary | ICD-10-CM | POA: Insufficient documentation

## 2020-11-09 DIAGNOSIS — Z96641 Presence of right artificial hip joint: Secondary | ICD-10-CM | POA: Diagnosis not present

## 2020-11-09 DIAGNOSIS — Z8616 Personal history of COVID-19: Secondary | ICD-10-CM | POA: Insufficient documentation

## 2020-11-09 DIAGNOSIS — Z87891 Personal history of nicotine dependence: Secondary | ICD-10-CM | POA: Diagnosis not present

## 2020-11-09 DIAGNOSIS — E1122 Type 2 diabetes mellitus with diabetic chronic kidney disease: Secondary | ICD-10-CM | POA: Diagnosis not present

## 2020-11-09 DIAGNOSIS — R079 Chest pain, unspecified: Secondary | ICD-10-CM | POA: Diagnosis not present

## 2020-11-09 DIAGNOSIS — N1831 Chronic kidney disease, stage 3a: Secondary | ICD-10-CM | POA: Diagnosis not present

## 2020-11-09 DIAGNOSIS — I5043 Acute on chronic combined systolic (congestive) and diastolic (congestive) heart failure: Secondary | ICD-10-CM | POA: Diagnosis not present

## 2020-11-09 DIAGNOSIS — Z91040 Latex allergy status: Secondary | ICD-10-CM | POA: Diagnosis not present

## 2020-11-09 DIAGNOSIS — Z794 Long term (current) use of insulin: Secondary | ICD-10-CM | POA: Diagnosis not present

## 2020-11-09 DIAGNOSIS — Z20822 Contact with and (suspected) exposure to covid-19: Secondary | ICD-10-CM | POA: Insufficient documentation

## 2020-11-09 DIAGNOSIS — I2510 Atherosclerotic heart disease of native coronary artery without angina pectoris: Secondary | ICD-10-CM | POA: Diagnosis not present

## 2020-11-09 DIAGNOSIS — K219 Gastro-esophageal reflux disease without esophagitis: Secondary | ICD-10-CM

## 2020-11-09 DIAGNOSIS — R0789 Other chest pain: Secondary | ICD-10-CM | POA: Diagnosis present

## 2020-11-09 DIAGNOSIS — I10 Essential (primary) hypertension: Secondary | ICD-10-CM

## 2020-11-09 DIAGNOSIS — Z8546 Personal history of malignant neoplasm of prostate: Secondary | ICD-10-CM | POA: Insufficient documentation

## 2020-11-09 DIAGNOSIS — E1165 Type 2 diabetes mellitus with hyperglycemia: Secondary | ICD-10-CM | POA: Insufficient documentation

## 2020-11-09 DIAGNOSIS — R778 Other specified abnormalities of plasma proteins: Secondary | ICD-10-CM | POA: Insufficient documentation

## 2020-11-09 DIAGNOSIS — I200 Unstable angina: Secondary | ICD-10-CM

## 2020-11-09 LAB — BASIC METABOLIC PANEL
Anion gap: 12 (ref 5–15)
BUN: 38 mg/dL — ABNORMAL HIGH (ref 8–23)
CO2: 23 mmol/L (ref 22–32)
Calcium: 8.8 mg/dL — ABNORMAL LOW (ref 8.9–10.3)
Chloride: 98 mmol/L (ref 98–111)
Creatinine, Ser: 1.55 mg/dL — ABNORMAL HIGH (ref 0.61–1.24)
GFR, Estimated: 44 mL/min — ABNORMAL LOW (ref >60)
Glucose, Bld: 587 mg/dL (ref 70–99)
Potassium: 3.8 mmol/L (ref 3.5–5.1)
Sodium: 133 mmol/L — ABNORMAL LOW (ref 135–145)

## 2020-11-09 LAB — RESP PANEL BY RT-PCR (FLU A&B, COVID) ARPGX2
Influenza A by PCR: NEGATIVE
Influenza B by PCR: NEGATIVE
SARS Coronavirus 2 by RT PCR: NEGATIVE

## 2020-11-09 LAB — CBC
HCT: 40.4 % (ref 39.0–52.0)
Hemoglobin: 14 g/dL (ref 13.0–17.0)
MCH: 30.4 pg (ref 26.0–34.0)
MCHC: 34.7 g/dL (ref 30.0–36.0)
MCV: 87.6 fL (ref 80.0–100.0)
Platelets: 196 10*3/uL (ref 150–400)
RBC: 4.61 MIL/uL (ref 4.22–5.81)
RDW: 13.2 % (ref 11.5–15.5)
WBC: 8.8 10*3/uL (ref 4.0–10.5)
nRBC: 0 % (ref 0.0–0.2)

## 2020-11-09 LAB — TROPONIN I (HIGH SENSITIVITY): Troponin I (High Sensitivity): 63 ng/L — ABNORMAL HIGH (ref <18)

## 2020-11-09 LAB — PROTIME-INR
INR: 1.3 — ABNORMAL HIGH (ref 0.8–1.2)
Prothrombin Time: 16.2 seconds — ABNORMAL HIGH (ref 11.4–15.2)

## 2020-11-09 LAB — CBG MONITORING, ED: Glucose-Capillary: 409 mg/dL — ABNORMAL HIGH (ref 70–99)

## 2020-11-09 LAB — BRAIN NATRIURETIC PEPTIDE: B Natriuretic Peptide: 607.9 pg/mL — ABNORMAL HIGH (ref 0.0–100.0)

## 2020-11-09 MED ORDER — ENOXAPARIN SODIUM 60 MG/0.6ML IJ SOSY: 0.5000 mg/kg | PREFILLED_SYRINGE | INTRAMUSCULAR | Status: DC

## 2020-11-09 MED ORDER — ONDANSETRON HCL 4 MG/2ML IJ SOLN: 4.0000 mg | Freq: Four times a day (QID) | INTRAMUSCULAR | Status: DC | PRN

## 2020-11-09 MED ORDER — INSULIN ASPART 100 UNIT/ML IJ SOLN
5.0000 [IU] | Freq: Once | INTRAMUSCULAR | Status: AC
  Administered 2020-11-09: 5 [IU] via INTRAVENOUS
  Filled 2020-11-09: qty 1

## 2020-11-09 MED ORDER — POTASSIUM CHLORIDE 10 MEQ/100ML IV SOLN
10.0000 meq | INTRAVENOUS | Status: AC
  Administered 2020-11-10: 10 meq via INTRAVENOUS
  Filled 2020-11-09 (×2): qty 100

## 2020-11-09 MED ORDER — LACTATED RINGERS IV SOLN
INTRAVENOUS | Status: DC
  Administered 2020-11-10: 125 mL/h via INTRAVENOUS

## 2020-11-09 MED ORDER — DEXTROSE 50 % IV SOLN: 0.0000 mL | INTRAVENOUS | Status: DC | PRN

## 2020-11-09 MED ORDER — ACETAMINOPHEN 325 MG PO TABS: 650.0000 mg | ORAL_TABLET | ORAL | Status: DC | PRN

## 2020-11-09 MED ORDER — DEXTROSE IN LACTATED RINGERS 5 % IV SOLN: INTRAVENOUS | Status: DC

## 2020-11-09 MED ORDER — INSULIN REGULAR(HUMAN) IN NACL 100-0.9 UT/100ML-% IV SOLN
INTRAVENOUS | Status: DC
  Administered 2020-11-10: 13 [IU]/h via INTRAVENOUS
  Filled 2020-11-09: qty 100

## 2020-11-09 NOTE — ED Notes (Signed)
Dr Flossie Buffy at bedside. Notified of recent BGL.

## 2020-11-09 NOTE — ED Notes (Signed)
ED Provider at bedside. 

## 2020-11-09 NOTE — ED Triage Notes (Signed)
Mid cp x 1hr, does not radiate, denies shob or nausea, reports pain went away after ems gave asa

## 2020-11-09 NOTE — ED Provider Notes (Signed)
Patient triage EKG is reviewed with some concerning ST abnormalities in inferior and lateral distribution.  Depressions primarily.  I do not see evidence of acute STEMI.  Acuity is high in the ER at this time, nursing charge nurse working to make medical bed treatment available as soon as possible.   Delman Kitten, MD 11/09/20 2121

## 2020-11-09 NOTE — Progress Notes (Signed)
PHARMACIST - PHYSICIAN COMMUNICATION  CONCERNING:  Enoxaparin (Lovenox) for DVT Prophylaxis    RECOMMENDATION: Patient was prescribed enoxaprin 40mg  q24 hours for VTE prophylaxis.   Filed Weights   11/09/20 2111  Weight: 89.2 kg (196 lb 10.4 oz)    Body mass index is 30.8 kg/m.  Estimated Creatinine Clearance: 38.5 mL/min (A) (by C-G formula based on SCr of 1.55 mg/dL (H)).   Based on [place] patient is candidate for enoxaparin 0.5mg /kg TBW SQ every 24 hours based on BMI being >30.  DESCRIPTION: Pharmacy has adjusted enoxaparin dose per [facility] policy.  Patient is now receiving enoxaparin 0.5 mg/kg every 24 hours   Renda Rolls, PharmD, [facility] 11/09/2020 11:40 PM

## 2020-11-09 NOTE — ED Provider Notes (Signed)
[facility] Emergency Department Provider Note   ____________________________________________   Event Date/Time   First MD Initiated Contact with Patient 11/09/20 2140     (approximate)  I have reviewed the triage vital signs and the nursing notes.   HISTORY  Chief Complaint Chest Pain    HPI A 86 year old patient with a history of peripheral artery disease, treated diabetes, hypertension and obesity presents for evaluation of chest pain. Initial onset of pain was less than one hour ago. The patient's chest pain is described as heaviness/pressure/tightness, is worse with exertion and is relieved by nitroglycerin. The patient's chest pain is middle- or left-sided, is not well-localized, is not sharp and does not radiate to the arms/jaw/neck. The patient does not complain of nausea and denies diaphoresis. The patient has no history of stroke, has not smoked in the past 90 days, has no relevant family history of coronary artery disease (first degree relative at less than age 74) and has no history of hypercholesterolemia.   Received 324 mg aspirin as well as 1 nitroglycerin with EMS with resolution of pain.  Currently reports asymptomatic.  He does report that his wife normally administers his insulin at night  Patient currently on Xarelto and reports compliance  He was out with family for dinner tonight when he walked back to the table after using the bathroom he started experiencing chest pressure pain and discomfort in his central chest reports about the same as what he experienced 2 weeks ago.  He does follow closely with Dr. Nehemiah Massed of cardiology  All of his pain and symptoms are now resolved  Past Medical History:  Diagnosis Date   Arthritis    Atrial fibrillation ([place])    Carcinoma of prostate ([place])    CHF (congestive heart failure) ([place])    Chronic kidney disease    Diabetes mellitus without complication [facility])    ED (erectile dysfunction)     Frequent urination    Hyperlipidemia    Hypertension    Moderate mitral insufficiency    Peripheral vascular disease ([place])    Pneumonia 04/2016   Prostate cancer ([place])    Sleep apnea    OSA--USE C-PAP   Ulcer of left foot due to type 2 diabetes mellitus ([place])    Urinary stress incontinence, male     Patient Active Problem List   Diagnosis Date Noted   Acute encephalopathy 07/29/2020   Lactic acidosis 07/28/2020   Hypoxic 07/28/2020   Chronic prescription opiate use 07/28/2020   Elevated troponin 07/28/2020   Chronic anticoagulation 06/25/2020   Chronic kidney disease, stage 3a ([place]) 06/25/2020   AMS (altered mental status) 04/08/2020   CAD (coronary artery disease) 03/06/2020   Personal history of COVID-19 03/06/2020   Acute toxic-metabolic encephalopathy 22/97/9892   UTI (urinary tract infection) 03/06/2020   (HFpEF) heart failure with preserved ejection fraction ([place]) 03/06/2020   Goals of care, counseling/discussion    Palliative care by specialist    Seizure disorder ([place]) 11/30/2019   ACS (acute coronary syndrome) ([place]) 06/24/2019   Sinus bradycardia on ECG 05/16/2019   PAF (paroxysmal atrial fibrillation) ([place]) 03/27/2019   Hx of BKA, right ([place]) 03/02/2019   Severe protein-calorie malnutrition ([place]) 11/94/1740   Acute systolic CHF (congestive heart failure) ([place]) 02/16/2019   Acute respiratory failure with hypoxia ([place]) 02/08/2019   Pneumonia due to COVID-19 virus 02/08/2019   Below-knee amputation of right lower extremity ([place]) 01/15/2019   Polyp of descending colon    Iron deficiency anemia  due to chronic blood loss    Sacral decubitus ulcer, stage II ([place]) 12/26/2018   Acute respiratory failure ([place]) 12/20/2018   Femur fracture ([place]) 10/13/2018   Nonspecific chest pain 06/03/2018   NSTEMI (non-ST elevated myocardial infarction) ([place]) 01/16/2018   Acute CHF (congestive heart failure) ([place]) 11/23/2017   Atrial fibrillation with rapid ventricular response ([place])  10/15/2017   BKA stump complication ([place]) 40/34/7425   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 ([place]) 02/10/2017   S/P bilateral BKA (below knee amputation) ([place]) 02/01/2017   Chronic diastolic heart failure ([place]) 01/21/2017   Pressure injury of skin 01/18/2017   Atherosclerosis of artery of extremity with gangrene ([place]) 01/05/2017   Diabetic foot ulcer ([place]) 12/29/2016   Ataxia 10/21/2016   Atherosclerosis of native arteries of the extremities with ulceration ([place]) 10/12/2016   History of femoral angiogram 09/06/2016   Left leg pain 09/06/2016   Leukocytosis 09/06/2016   Generalized weakness 09/06/2016   AKI (acute kidney injury) ([place]) 09/06/2016   Atrial fibrillation, chronic ([place]) 08/30/2016   Pneumonia 05/19/2016   Hyperlipidemia 04/20/2016   Back pain 04/19/2016   DM (diabetes mellitus), type 2 with complications ([place]) 95/63/8756   Primary hypertension 04/19/2016   PAD (peripheral artery disease) ([place]) 04/19/2016   Erectile dysfunction following radical prostatectomy 06/25/2015   History of prostate cancer 12/23/2014   Incontinence 12/23/2014    Past Surgical History:  Procedure Laterality Date   AMPUTATION Left 01/12/2017   Procedure: AMPUTATION BELOW KNEE;  Surgeon: Algernon Huxley, MD;  Location: [facility] ORS;  Service: General;  Laterality: Left;   AMPUTATION Right 12/27/2018   Procedure: AMPUTATION BELOW KNEE;  Surgeon: Algernon Huxley, MD;  Location: [facility] ORS;  Service: General;  Laterality: Right;   APLIGRAFT PLACEMENT Left 10/15/2016   Procedure: APLIGRAFT PLACEMENT;  Surgeon: Albertine Patricia, DPM;  Location: [facility] ORS;  Service: Podiatry;  Laterality: Left;  necrotic ulcer   CHOLECYSTECTOMY     COLONOSCOPY WITH PROPOFOL N/A 01/02/2019   Procedure: COLONOSCOPY WITH PROPOFOL;  Surgeon: Lucilla Lame, MD;  Location: [facility] ENDOSCOPY;  Service: Endoscopy;  Laterality: N/A;   COLONOSCOPY WITH PROPOFOL N/A 01/05/2019   Procedure: COLONOSCOPY WITH PROPOFOL;  Surgeon: Lucilla Lame, MD;  Location: [facility] ENDOSCOPY;  Service: Endoscopy;  Laterality: N/A;   ESOPHAGOGASTRODUODENOSCOPY (EGD) WITH PROPOFOL N/A 01/02/2019   Procedure: ESOPHAGOGASTRODUODENOSCOPY (EGD) WITH PROPOFOL;  Surgeon: Lucilla Lame, MD;  Location: [facility] ENDOSCOPY;  Service: Endoscopy;  Laterality: N/A;   EYE SURGERY Bilateral    Cataract Extraction with IOL   HIP ARTHROPLASTY Right 10/13/2018   Procedure: ARTHROPLASTY BIPOLAR HIP (HEMIARTHROPLASTY);  Surgeon: Earnestine Leys, MD;  Location: [facility] ORS;  Service: Orthopedics;  Laterality: Right;   IRRIGATION AND DEBRIDEMENT FOOT Left 10/15/2016   Procedure: IRRIGATION AND DEBRIDEMENT FOOT-EXCISIONAL DEBRIDEMENT OF SKIN 3RD, 4TH AND 5TH TOES WITH APPLICATION OF APLIGRAFT;  Surgeon: Albertine Patricia, DPM;  Location: [facility] ORS;  Service: Podiatry;  Laterality: Left;  necrotic,gangrene   IRRIGATION AND DEBRIDEMENT FOOT Left 12/09/2016   Procedure: IRRIGATION AND DEBRIDEMENT FOOT-MUSCLE/FASCIA, RESECTION OF FOURTH AND FIFTH METATARSAL NECROTIC BONE;  Surgeon: Albertine Patricia, DPM;  Location: [facility] ORS;  Service: Podiatry;  Laterality: Left;   IRRIGATION AND DEBRIDEMENT FOOT Right 12/23/2018   Procedure: IRRIGATION AND DEBRIDEMENT FOOT and wound vac application;  Surgeon: Samara Deist, DPM;  Location: [facility] ORS;  Service: Podiatry;  Laterality: Right;   LEFT HEART CATH N/A 06/25/2019   Procedure: Left Heart Cath and Coronary Angiography;  Surgeon: Isaias Cowman, MD;  Location: [facility]  CV LAB;  Service: Cardiovascular;  Laterality: N/A;   LEFT HEART CATH AND CORONARY ANGIOGRAPHY N/A 06/05/2018   Procedure: LEFT HEART CATH AND CORONARY ANGIOGRAPHY;  Surgeon: Yolonda Kida, MD;  Location: [place] CV LAB;  Service: Cardiovascular;  Laterality: N/A;   LOWER EXTREMITY ANGIOGRAPHY Left 08/16/2016   Procedure: Lower Extremity Angiography;  Surgeon: Algernon Huxley, MD;  Location: [place] CV LAB;  Service: Cardiovascular;  Laterality: Left;   LOWER  EXTREMITY ANGIOGRAPHY Left 09/01/2016   Procedure: Lower Extremity Angiography;  Surgeon: Algernon Huxley, MD;  Location: [place] CV LAB;  Service: Cardiovascular;  Laterality: Left;   LOWER EXTREMITY ANGIOGRAPHY Left 10/14/2016   Procedure: Lower Extremity Angiography;  Surgeon: Algernon Huxley, MD;  Location: [place] CV LAB;  Service: Cardiovascular;  Laterality: Left;   LOWER EXTREMITY ANGIOGRAPHY Left 12/20/2016   Procedure: Lower Extremity Angiography;  Surgeon: Algernon Huxley, MD;  Location: [place] CV LAB;  Service: Cardiovascular;  Laterality: Left;   LOWER EXTREMITY ANGIOGRAPHY Right 12/18/2018   Procedure: LOWER EXTREMITY ANGIOGRAPHY;  Surgeon: Algernon Huxley, MD;  Location: [place] CV LAB;  Service: Cardiovascular;  Laterality: Right;   LOWER EXTREMITY INTERVENTION  09/01/2016   Procedure: Lower Extremity Intervention;  Surgeon: Algernon Huxley, MD;  Location: [facility] LAB;  Service: Cardiovascular;;   PROSTATE SURGERY     removal   TONSILLECTOMY     as a child    Prior to Admission medications   Medication Sig Start Date End Date Taking? Authorizing Provider  atorvastatin (LIPITOR) 80 MG tablet Take 80 mg by mouth at bedtime.    [provider]  bisacodyl (DULCOLAX) 5 MG EC tablet Take 1 tablet (5 mg total) by mouth daily as needed for moderate constipation. 04/10/20   Lorella Nimrod, MD  chlorthalidone (HYGROTON) 25 MG tablet Take 25 mg by mouth daily.    [provider]  diltiazem (CARDIZEM CD) 240 MG 24 hr capsule Take 1 capsule (240 mg total) by mouth daily. 08/05/20   Shelly Coss, MD  divalproex (DEPAKOTE) 500 MG DR tablet Take 500 mg by mouth daily.  02/18/20   [provider]  Ensure Max Protein (ENSURE MAX PROTEIN) LIQD Take 330 mLs (11 oz total) by mouth 2 (two) times daily. 04/10/20   Lorella Nimrod, MD  ferrous sulfate 325 (65 FE) MG tablet Take 325 mg by mouth daily with breakfast.    [provider]  Insulin Glargine  (BASAGLAR KWIKPEN) 100 UNIT/ML Inject 20 Units into the skin at bedtime. 08/16/2020   Shelly Coss, MD  isosorbide mononitrate (IMDUR) 120 MG 24 hr tablet Take 1 tablet (120 mg total) by mouth daily. 10/26/20   [name] Mandes, MD  metFORMIN (GLUCOPHAGE) 500 MG tablet Take 500 mg by mouth 2 (two) times daily with a meal.    [provider]  metoprolol succinate (TOPROL-XL) 25 MG 24 hr tablet Take 1 tablet (25 mg total) by mouth daily. Take with or immediately following a meal. 04/10/20   Lorella Nimrod, MD  nitroGLYCERIN (NITROSTAT) 0.4 MG SL tablet Place 1 tablet (0.4 mg total) under the tongue every 5 (five) minutes as needed for chest pain. 10/25/20   [name] Mandes, MD  NOVOLOG FLEXPEN 100 UNIT/ML FlexPen Inject 4 Units into the skin 3 (three) times daily with meals. 04/08/20   [provider]  pantoprazole (PROTONIX) 40 MG tablet Take 1 tablet (40 mg total) by mouth at bedtime for 14 days. 08/05/2020 08/18/20  Shelly Coss, MD  Rivaroxaban (XARELTO) 15 MG TABS tablet Take 1 tablet (15 mg total) by mouth daily with supper. Hold Xarelto for 3 days.  Start on January 09, 2019. 01/09/19   [name], MD    Allergies Contrast media [iodinated diagnostic agents], Iohexol, Metrizamide, and Latex  Family History  Problem Relation Age of Onset   Hypertension Mother    Diabetes Mother    Prostate cancer Neg Hx    Bladder Cancer Neg Hx    Kidney disease Neg Hx     Social History Social History   Tobacco Use   Smoking status: Former    Packs/day: 0.25    Pack years: 0.00    Types: Cigarettes    Quit date: 10/14/1994    Years since quitting: 26.0   Smokeless tobacco: Never  Vaping Use   Vaping Use: Never used  Substance Use Topics   Alcohol use: No    Alcohol/week: 0.0 standard drinks   Drug use: No    Review of Systems Constitutional: No fever/chills Eyes: No visual changes. ENT: No sore throat. Cardiovascular: See HPI Respiratory: Denies shortness of  breath. Gastrointestinal: No abdominal pain.  Had normal bowel movement Genitourinary: Negative for dysuria. Musculoskeletal: Negative for back pain. Skin: Negative for rash. Neurological: Negative for headaches, areas of focal weakness or numbness.    ____________________________________________   PHYSICAL EXAM:  VITAL SIGNS: ED Triage Vitals  Enc Vitals Group     BP 11/09/20 2114 138/85     Pulse Rate 11/09/20 2114 90     Resp 11/09/20 2114 20     Temp 11/09/20 2114 98.3 F (36.8 C)     Temp Source 11/09/20 2114 Oral     SpO2 11/09/20 2114 93 %     Weight 11/09/20 2111 196 lb 10.4 oz (89.2 kg)     Height 11/09/20 2111 5\' 7"  (1.702 m)     Head Circumference --      Peak Flow --      Pain Score 11/09/20 2111 0     Pain Loc --      Pain Edu? --      Excl. in [place]? --     Constitutional: Alert and oriented. Well appearing and in no acute distress. Eyes: Conjunctivae are normal. Head: Atraumatic. Nose: No congestion/rhinnorhea. Mouth/Throat: Mucous membranes are moist. Neck: No stridor.  Cardiovascular: Normal rate, regular rhythm. Grossly normal heart sounds.  Good peripheral circulation. Respiratory: Normal respiratory effort.  No retractions. Lungs CTAB. Gastrointestinal: Soft and nontender. No distention. Musculoskeletal: No lower extremity tenderness with bilateral BKA's. Neurologic:  Normal speech and language. No gross focal neurologic deficits are appreciated.  Skin:  Skin is warm, dry and intact. No rash noted. Psychiatric: Mood and affect are normal. Speech and behavior are normal.  ____________________________________________   LABS (all labs ordered are listed, but only abnormal results are displayed)  Labs Reviewed  BASIC METABOLIC PANEL - Abnormal; Notable for the following components:      Result Value   Sodium 133 (*)    Glucose, Bld 587 (*)    BUN 38 (*)    Creatinine, Ser 1.55 (*)    Calcium 8.8 (*)    GFR, Estimated 44 (*)    All other  components within normal limits  PROTIME-INR - Abnormal; Notable for the following components:   Prothrombin Time 16.2 (*)    INR 1.3 (*)    All other components within normal limits  TROPONIN I (HIGH SENSITIVITY) -  Abnormal; Notable for the following components:   Troponin I (High Sensitivity) 63 (*)    All other components within normal limits  RESP PANEL BY RT-PCR (FLU A&B, COVID) ARPGX2  CBC  BRAIN NATRIURETIC PEPTIDE  CBG MONITORING, ED  CBG MONITORING, ED  CBG MONITORING, ED  CBG MONITORING, ED  CBG MONITORING, ED  CBG MONITORING, ED  CBG MONITORING, ED  CBG MONITORING, ED  CBG MONITORING, ED  CBG MONITORING, ED  CBG MONITORING, ED  CBG MONITORING, ED  TROPONIN I (HIGH SENSITIVITY)   ____________________________________________  EKG  Reviewed interpreted by me at 2115 Heart rate 100 QRS 80 QTc 500 Atrial fibrillation, old septal Q waves, notable ST segment depressions in inferior lateral distribution.  Minimal ST elevation noted in aVR only.  No STEMI ____________________________________________  RADIOLOGY  DG Chest 2 View  Result Date: 11/09/2020 CLINICAL DATA:  Chest pain for 1 hour EXAM: CHEST - 2 VIEW COMPARISON:  10/24/2020 FINDINGS: Cardiac shadow is stable. Aortic calcifications are noted. The lungs are well aerated bilaterally. No focal infiltrate or effusion is seen. No acute bony abnormality is noted. Chronic degenerative changes of the thoracic spine are noted. IMPRESSION: No acute abnormality seen. Electronically Signed   By: Inez Catalina M.D.   On: 11/09/2020 21:47     ____________________________________________   PROCEDURES  Procedure(s) performed: None  Procedures  Critical Care performed: No  ____________________________________________   INITIAL IMPRESSION / ASSESSMENT AND PLAN / ED COURSE  Pertinent labs & imaging results that were available during my care of the patient were reviewed by me and considered in my medical decision  making (see chart for details).   Differential diagnosis includes, but is not limited to, ACS, aortic dissection, pulmonary embolism, cardiac tamponade, pneumothorax, pneumonia, pericarditis, myocarditis, GI-related causes including esophagitis/gastritis, and musculoskeletal chest wall pain.     Patient currently pain-free.  He is currently anticoagulated on Xarelto.  Hyperglycemia, no evidence of DKA.  Normal CO2 and normal gap.  Will give short acting insulin continue to follow his glucose, his troponin is elevated but he is currently pain-free.  Minimal elevation at this time known history of unstable angina, also has a severe allergic reaction history to iodinated contrast materials.  Given he is pain-free at this time, discussed with the patient, will admit to the hospitalist service for further management in anticipation of cardiology consultation.  Patient understanding agreeable with this plan.      ____________________________________________   FINAL CLINICAL IMPRESSION(S) / ED DIAGNOSES  Final diagnoses:  Unstable angina ([place])        Note:  This document was prepared using Dragon voice recognition software and may include unintentional dictation errors       Delman Kitten, MD 11/09/20 2247

## 2020-11-09 NOTE — ED Notes (Signed)
Ems gave 4-81mg  asa, per ems normal ekg, 196/84, hr 80 per ems.

## 2020-11-10 DIAGNOSIS — R079 Chest pain, unspecified: Secondary | ICD-10-CM | POA: Diagnosis not present

## 2020-11-10 DIAGNOSIS — K219 Gastro-esophageal reflux disease without esophagitis: Secondary | ICD-10-CM

## 2020-11-10 DIAGNOSIS — E1165 Type 2 diabetes mellitus with hyperglycemia: Secondary | ICD-10-CM | POA: Diagnosis not present

## 2020-11-10 DIAGNOSIS — I480 Paroxysmal atrial fibrillation: Secondary | ICD-10-CM | POA: Diagnosis not present

## 2020-11-10 LAB — CBG MONITORING, ED
Glucose-Capillary: 434 mg/dL — ABNORMAL HIGH (ref 70–99)
Glucose-Capillary: 374 mg/dL — ABNORMAL HIGH (ref 70–99)
Glucose-Capillary: 213 mg/dL — ABNORMAL HIGH (ref 70–99)
Glucose-Capillary: 153 mg/dL — ABNORMAL HIGH (ref 70–99)
Glucose-Capillary: 142 mg/dL — ABNORMAL HIGH (ref 70–99)
Glucose-Capillary: 138 mg/dL — ABNORMAL HIGH (ref 70–99)
Glucose-Capillary: 130 mg/dL — ABNORMAL HIGH (ref 70–99)
Glucose-Capillary: 140 mg/dL — ABNORMAL HIGH (ref 70–99)
Glucose-Capillary: 134 mg/dL — ABNORMAL HIGH (ref 70–99)
Glucose-Capillary: 164 mg/dL — ABNORMAL HIGH (ref 70–99)
Glucose-Capillary: 216 mg/dL — ABNORMAL HIGH (ref 70–99)
Glucose-Capillary: 270 mg/dL — ABNORMAL HIGH (ref 70–99)
Glucose-Capillary: 310 mg/dL — ABNORMAL HIGH (ref 70–99)
Glucose-Capillary: 330 mg/dL — ABNORMAL HIGH (ref 70–99)
Glucose-Capillary: 190 mg/dL — ABNORMAL HIGH (ref 70–99)

## 2020-11-10 LAB — BASIC METABOLIC PANEL
Anion gap: 9 (ref 5–15)
BUN: 33 mg/dL — ABNORMAL HIGH (ref 8–23)
CO2: 28 mmol/L (ref 22–32)
Calcium: 8.6 mg/dL — ABNORMAL LOW (ref 8.9–10.3)
Chloride: 102 mmol/L (ref 98–111)
Creatinine, Ser: 1.38 mg/dL — ABNORMAL HIGH (ref 0.61–1.24)
GFR, Estimated: 51 mL/min — ABNORMAL LOW (ref >60)
Glucose, Bld: 146 mg/dL — ABNORMAL HIGH (ref 70–99)
Potassium: 3.7 mmol/L (ref 3.5–5.1)
Sodium: 139 mmol/L (ref 135–145)

## 2020-11-10 LAB — TROPONIN I (HIGH SENSITIVITY)
Troponin I (High Sensitivity): 132 ng/L (ref <18)
Troponin I (High Sensitivity): 226 ng/L (ref <18)
Troponin I (High Sensitivity): 190 ng/L (ref <18)

## 2020-11-10 MED ORDER — POTASSIUM CHLORIDE 10 MEQ/100ML IV SOLN
10.0000 meq | Freq: Once | INTRAVENOUS | Status: AC
  Administered 2020-11-10: 10 meq via INTRAVENOUS

## 2020-11-10 MED ORDER — DILTIAZEM HCL ER COATED BEADS 240 MG PO CP24
240.0000 mg | ORAL_CAPSULE | Freq: Every day | ORAL | Status: DC
  Administered 2020-11-10: 240 mg via ORAL
  Filled 2020-11-10: qty 1

## 2020-11-10 MED ORDER — INSULIN GLARGINE 100 UNIT/ML ~~LOC~~ SOLN
20.0000 [IU] | Freq: Once | SUBCUTANEOUS | Status: AC
  Administered 2020-11-10: 20 [IU] via SUBCUTANEOUS
  Filled 2020-11-10: qty 0.2

## 2020-11-10 MED ORDER — NOVOLOG FLEXPEN 100 UNIT/ML ~~LOC~~ SOPN: 6.0000 [IU] | PEN_INJECTOR | Freq: Three times a day (TID) | SUBCUTANEOUS | 0 refills | Status: DC

## 2020-11-10 MED ORDER — DIVALPROEX SODIUM 500 MG PO DR TAB
500.0000 mg | DELAYED_RELEASE_TABLET | Freq: Every day | ORAL | Status: DC
  Administered 2020-11-10: 500 mg via ORAL
  Filled 2020-11-10: qty 1

## 2020-11-10 MED ORDER — RIVAROXABAN 15 MG PO TABS: 15.0000 mg | ORAL_TABLET | Freq: Every day | ORAL | Status: DC

## 2020-11-10 MED ORDER — FERROUS SULFATE 325 (65 FE) MG PO TABS
325.0000 mg | ORAL_TABLET | Freq: Every day | ORAL | Status: DC
  Administered 2020-11-10: 325 mg via ORAL
  Filled 2020-11-10: qty 1

## 2020-11-10 MED ORDER — INSULIN ASPART 100 UNIT/ML IJ SOLN
10.0000 [IU] | Freq: Once | INTRAMUSCULAR | Status: AC
  Administered 2020-11-10: 10 [IU] via SUBCUTANEOUS
  Filled 2020-11-10: qty 1

## 2020-11-10 MED ORDER — PANTOPRAZOLE SODIUM 40 MG PO TBEC: 40.0000 mg | DELAYED_RELEASE_TABLET | Freq: Every day | ORAL | Status: DC

## 2020-11-10 MED ORDER — INSULIN GLARGINE 100 UNIT/ML ~~LOC~~ SOLN
25.0000 [IU] | Freq: Every day | SUBCUTANEOUS | Status: DC
  Administered 2020-11-10: 25 [IU] via SUBCUTANEOUS
  Filled 2020-11-10 (×2): qty 0.25

## 2020-11-10 MED ORDER — INSULIN ASPART 100 UNIT/ML IJ SOLN: 0.0000 [IU] | Freq: Three times a day (TID) | INTRAMUSCULAR | Status: DC

## 2020-11-10 MED ORDER — METOPROLOL SUCCINATE ER 50 MG PO TB24
25.0000 mg | ORAL_TABLET | Freq: Every day | ORAL | Status: DC
  Administered 2020-11-10: 25 mg via ORAL
  Filled 2020-11-10: qty 1

## 2020-11-10 MED ORDER — ATORVASTATIN CALCIUM 20 MG PO TABS: 80.0000 mg | ORAL_TABLET | Freq: Every day | ORAL | Status: DC

## 2020-11-10 MED ORDER — CHLORTHALIDONE 25 MG PO TABS
25.0000 mg | ORAL_TABLET | Freq: Every day | ORAL | Status: DC
  Administered 2020-11-10: 25 mg via ORAL
  Filled 2020-11-10: qty 1

## 2020-11-10 MED ORDER — BASAGLAR KWIKPEN 100 UNIT/ML ~~LOC~~ SOPN: 20.0000 [IU] | PEN_INJECTOR | Freq: Two times a day (BID) | SUBCUTANEOUS | Status: DC

## 2020-11-10 MED ORDER — ISOSORBIDE MONONITRATE ER 60 MG PO TB24
120.0000 mg | ORAL_TABLET | Freq: Every day | ORAL | Status: DC
  Administered 2020-11-10: 120 mg via ORAL
  Filled 2020-11-10: qty 2

## 2020-11-10 NOTE — Discharge Summary (Signed)
Physician Discharge Summary  Patient ID: [name] MRN: [id number] DOB/AGE: 09-02-1934 86 y.o.  Admit date: 11/09/2020 Discharge date: 11/10/2020  Admission Diagnoses:  Discharge Diagnoses:  Active Problems:   Primary hypertension   PAF (paroxysmal atrial fibrillation) (HCC)   Chest pain   Hyperglycemia due to diabetes mellitus ([place])   GERD (gastroesophageal reflux disease)   Discharged Condition: fair  Hospital Course:  [name] is a 86 y.o. male with medical history significant for PAD s/p bilateral BKA, combined CHF, paroxysmal atrial fibrillation on Xarelto, insulin-dependent type 2 diabetes, CKD stage IIIa, sleep apnea not on CPAP presents with chest pain.  His peak troponin is 226, came down to 190.  He is seen by cardiology, does not believe patient has an stable angina or non-STEMI.  No need for additional work-up.  No need for change treatment plan. Patient also has uncontrolled type 2 diabetes, glucose running at 400 time came in, recent hemoglobin A1c was 9.5.  He was placed on insulin drip overnight.  Glucose still running high.  He received 25 units of Lantus in the morning, will give another 20 units in the evening.  At this point, he will continue 20 units of Lantus twice a day.  Also increase NovoLog to 6 units 3 times a day at home. Patient insisted to go home, cardiology also cleared patient to go home.  At this point, patient is medically stable to be discharged.  #1.  Chest pain with mild elevation troponin. Per cardiology, patient does not have unstable angina, does not have non-STEMI.  Resume all home medicines.  Patient be followed by cardiology as outpatient.  2.  Uncontrolled type 2 diabetes with hyperglycemia. Chronic kidney disease stage IIIa secondary to diabetic nephropathy.. Hemoglobin A1c 9.5 recently.  Increase Lantus to 20 units twice a day, increase NovoLog to 6 units 3 times a day.  Follow-up with PCP as outpatient to adjust  dose.  3.  Paroxysmal atrial fibrillation. Continue Cardizem and add Xarelto.  4.  Essential hypertension. Continue home medicines.  Consults: cardiology Significant Diagnostic Studies:     Treatments: insulin drip  Discharge Exam: Blood pressure 133/71, pulse 63, temperature 98.3 F (36.8 C), temperature source Oral, resp. rate 12, height 5\' 7"  (1.702 m), weight 89.2 kg, SpO2 96 %. General appearance: alert and cooperative Resp: clear to auscultation bilaterally Cardio: Irregular, no murmurs. GI: soft, non-tender; bowel sounds normal; no masses,  no organomegaly Extremities: extremities normal, atraumatic, no cyanosis or edema  Disposition: Discharge disposition: 01-Home or Self Care       Discharge Instructions     Diet - low sodium heart healthy   Complete by: As directed    Increase activity slowly   Complete by: As directed       Allergies as of 11/10/2020       Reactions   Contrast Media [iodinated Diagnostic Agents] Shortness Of Breath   SOB   Iohexol Shortness Of Breath    Desc: Respiratory Distress, Laryngedema, diaphoresis, Onset Date: [id number]   Metrizamide Shortness Of Breath   SOB SOB SOB   Latex Rash        Medication List     TAKE these medications    aspirin 81 MG EC tablet Take 81 mg by mouth daily.   atorvastatin 80 MG tablet Commonly known as: LIPITOR Take 80 mg by mouth at bedtime.   Basaglar KwikPen 100 UNIT/ML Inject 20 Units into the skin 2 (two) times daily. What changed: when to  take this   bisacodyl 5 MG EC tablet Commonly known as: DULCOLAX Take 1 tablet (5 mg total) by mouth daily as needed for moderate constipation.   chlorthalidone 25 MG tablet Commonly known as: HYGROTON Take 25 mg by mouth daily.   divalproex 500 MG DR tablet Commonly known as: DEPAKOTE Take 500 mg by mouth daily.   Ensure Max Protein Liqd Take 330 mLs (11 oz total) by mouth 2 (two) times daily.   ferrous sulfate 325 (65 FE) MG  tablet Take 325 mg by mouth daily with breakfast.   isosorbide mononitrate 120 MG 24 hr tablet Commonly known as: IMDUR Take 1 tablet (120 mg total) by mouth daily.   metFORMIN 500 MG tablet Commonly known as: GLUCOPHAGE Take 500 mg by mouth 2 (two) times daily with a meal.   metoprolol succinate 25 MG 24 hr tablet Commonly known as: TOPROL-XL Take 1 tablet (25 mg total) by mouth daily. Take with or immediately following a meal.   nitroGLYCERIN 0.4 MG SL tablet Commonly known as: NITROSTAT Place 1 tablet (0.4 mg total) under the tongue every 5 (five) minutes as needed for chest pain.   NovoLOG FlexPen 100 UNIT/ML FlexPen Generic drug: insulin aspart Inject 6 Units into the skin 3 (three) times daily with meals. What changed: how much to take   pantoprazole 40 MG tablet Commonly known as: PROTONIX Take 1 tablet (40 mg total) by mouth at bedtime for 14 days.   Rivaroxaban 15 MG Tabs tablet Commonly known as: XARELTO Take 1 tablet (15 mg total) by mouth daily with supper. Hold Xarelto for 3 days.  Start on January 09, 2019.   Tiadylt ER 120 MG 24 hr capsule Generic drug: diltiazem Take 120 mg by mouth daily.        Follow-up Information     Ezequiel Kayser, MD Follow up in 1 week(s).   Specialty: Internal Medicine Contact information: [name] [address] [phone]         Corey Skains, MD Follow up in 2 week(s).   Specialty: Cardiology Contact information: [address] [facility] [facility] [place] [address] [phone]                 Signed: Sharen Hones 11/10/2020, 3:00 PM

## 2020-11-10 NOTE — ED Notes (Signed)
Brief changed and primofit applied.

## 2020-11-10 NOTE — ED Notes (Signed)
Pt alert, talking on phone, meds given.

## 2020-11-10 NOTE — ED Notes (Signed)
Dietary contacted to send meal tray for pt.

## 2020-11-10 NOTE — ED Notes (Signed)
Iv dc'ed.  D/c inst to pt 

## 2020-11-10 NOTE — H&P (Addendum)
History and Physical    [name] TSV:[id number] DOB: 02/22/1935 DOA: 11/09/2020  PCP: Ezequiel Kayser, MD  Patient coming from: Home  I have personally briefly reviewed patient's old medical records in [place]  Chief Complaint: Chest pain  HPI: [name] is a 86 y.o. male with medical history significant for PAD s/p bilateral BKA, combined CHF, paroxysmal atrial fibrillation on Xarelto, insulin-dependent type 2 diabetes, CKD stage IIIa, sleep apnea not on CPAP presents with chest pain.  Patient was eating at a restaurant today and went to the bathroom to urinate.  Upon walk back to his table he began to note focal nonradiating left-sided chest pain that lasted for 20 minutes. Pain resolved without any intervention.  Has not had chest pain since that episode upon arriving in the ED.  He denies any associated shortness of breath.  No diaphoresis.  No nausea, vomiting or diarrhea. In the ED, Initial Troponin of 63 with ST depression seen on inferolateral lead. Cr around baseline at 1.35. BG of 587.  He was recently admitted on 6/3 with similar chest pain. EKG similar to today with ST depression to inferiolateral leads. Troponin up to 453. He was evaluated by cardiology and deemed not a cath candidate due to comorbidies by cardiology. He was medically managed with increase Imdur at discharge.He follows with Dr. Nehemiah Massed.    Review of Systems: Constitutional: No Weight Change, No Fever ENT/Mouth: No sore throat, No Rhinorrhea Eyes: No Eye Pain, No Vision Changes Cardiovascular: +Chest Pain, no SOB, No PND, No Dyspnea on Exertion, No Orthopnea, , No Edema, No Palpitations Respiratory: No Cough, No Sputum,  Gastrointestinal: No Nausea, No Vomiting, No Diarrhea, No Constipation, No Pain Genitourinary: no Urinary Incontinence, No Urgency, No Flank Pain Musculoskeletal: No Arthralgias, No Myalgias Skin: No Skin Lesions, No Pruritus, Neuro: no Weakness, No  Numbness Psych: No Anxiety/Panic, No Depression, no decrease appetite Heme/Lymph: No Bruising, No Bleeding  Past Medical History:  Diagnosis Date   Arthritis    Atrial fibrillation (HCC)    Carcinoma of prostate (HCC)    CHF (congestive heart failure) (HCC)    Chronic kidney disease    Diabetes mellitus without complication (HCC)    ED (erectile dysfunction)    Frequent urination    Hyperlipidemia    Hypertension    Moderate mitral insufficiency    Peripheral vascular disease ([place])    Pneumonia 04/2016   Prostate cancer ([place])    Sleep apnea    OSA--USE C-PAP   Ulcer of left foot due to type 2 diabetes mellitus ([place])    Urinary stress incontinence, male     Past Surgical History:  Procedure Laterality Date   AMPUTATION Left 01/12/2017   Procedure: AMPUTATION BELOW KNEE;  Surgeon: Algernon Huxley, MD;  Location: [facility];  Service: General;  Laterality: Left;   AMPUTATION Right 12/27/2018   Procedure: AMPUTATION BELOW KNEE;  Surgeon: Algernon Huxley, MD;  Location: [facility];  Service: General;  Laterality: Right;   APLIGRAFT PLACEMENT Left 10/15/2016   Procedure: APLIGRAFT PLACEMENT;  Surgeon: Albertine Patricia, DPM;  Location: [facility];  Service: Podiatry;  Laterality: Left;  necrotic ulcer   CHOLECYSTECTOMY     COLONOSCOPY WITH PROPOFOL N/A 01/02/2019   Procedure: COLONOSCOPY WITH PROPOFOL;  Surgeon: Lucilla Lame, MD;  Location: [facility] ENDOSCOPY;  Service: Endoscopy;  Laterality: N/A;   COLONOSCOPY WITH PROPOFOL N/A 01/05/2019   Procedure: COLONOSCOPY WITH PROPOFOL;  Surgeon: Lucilla Lame, MD;  Location: [facility] ENDOSCOPY;  Service:  Endoscopy;  Laterality: N/A;   ESOPHAGOGASTRODUODENOSCOPY (EGD) WITH PROPOFOL N/A 01/02/2019   Procedure: ESOPHAGOGASTRODUODENOSCOPY (EGD) WITH PROPOFOL;  Surgeon: Lucilla Lame, MD;  Location: [facility] ENDOSCOPY;  Service: Endoscopy;  Laterality: N/A;   EYE SURGERY Bilateral    Cataract Extraction with IOL   HIP ARTHROPLASTY Right 10/13/2018   Procedure: ARTHROPLASTY  BIPOLAR HIP (HEMIARTHROPLASTY);  Surgeon: Earnestine Leys, MD;  Location: [facility];  Service: Orthopedics;  Laterality: Right;   IRRIGATION AND DEBRIDEMENT FOOT Left 10/15/2016   Procedure: IRRIGATION AND DEBRIDEMENT FOOT-EXCISIONAL DEBRIDEMENT OF SKIN 3RD, 4TH AND 5TH TOES WITH APPLICATION OF APLIGRAFT;  Surgeon: Albertine Patricia, DPM;  Location: [facility];  Service: Podiatry;  Laterality: Left;  necrotic,gangrene   IRRIGATION AND DEBRIDEMENT FOOT Left 12/09/2016   Procedure: IRRIGATION AND DEBRIDEMENT FOOT-MUSCLE/FASCIA, RESECTION OF FOURTH AND FIFTH METATARSAL NECROTIC BONE;  Surgeon: Albertine Patricia, DPM;  Location: [facility];  Service: Podiatry;  Laterality: Left;   IRRIGATION AND DEBRIDEMENT FOOT Right 12/23/2018   Procedure: IRRIGATION AND DEBRIDEMENT FOOT and wound vac application;  Surgeon: Samara Deist, DPM;  Location: [facility];  Service: Podiatry;  Laterality: Right;   LEFT HEART CATH N/A 06/25/2019   Procedure: Left Heart Cath and Coronary Angiography;  Surgeon: Isaias Cowman, MD;  Location: [place] [facility];  Service: Cardiovascular;  Laterality: N/A;   LEFT HEART CATH AND CORONARY ANGIOGRAPHY N/A 06/05/2018   Procedure: LEFT HEART CATH AND CORONARY ANGIOGRAPHY;  Surgeon: Yolonda Kida, MD;  Location: [place] [facility];  Service: Cardiovascular;  Laterality: N/A;   LOWER EXTREMITY ANGIOGRAPHY Left 08/16/2016   Procedure: Lower Extremity Angiography;  Surgeon: Algernon Huxley, MD;  Location: [place] [facility];  Service: Cardiovascular;  Laterality: Left;   LOWER EXTREMITY ANGIOGRAPHY Left 09/01/2016   Procedure: Lower Extremity Angiography;  Surgeon: Algernon Huxley, MD;  Location: [place] [facility];  Service: Cardiovascular;  Laterality: Left;   LOWER EXTREMITY ANGIOGRAPHY Left 10/14/2016   Procedure: Lower Extremity Angiography;  Surgeon: Algernon Huxley, MD;  Location: [place] [facility];  Service: Cardiovascular;  Laterality: Left;   LOWER EXTREMITY ANGIOGRAPHY Left 12/20/2016    Procedure: Lower Extremity Angiography;  Surgeon: Algernon Huxley, MD;  Location: [place] [facility];  Service: Cardiovascular;  Laterality: Left;   LOWER EXTREMITY ANGIOGRAPHY Right 12/18/2018   Procedure: LOWER EXTREMITY ANGIOGRAPHY;  Surgeon: Algernon Huxley, MD;  Location: [place] [facility];  Service: Cardiovascular;  Laterality: Right;   LOWER EXTREMITY INTERVENTION  09/01/2016   Procedure: Lower Extremity Intervention;  Surgeon: Algernon Huxley, MD;  Location: [place] [facility];  Service: Cardiovascular;;   PROSTATE SURGERY     removal   TONSILLECTOMY     as a child     reports that he quit smoking about 26 years ago. His smoking use included cigarettes. He smoked an average of 0.25 packs per day. He has never used smokeless tobacco. He reports that he does not drink alcohol and does not use drugs. Social History  Allergies  Allergen Reactions   Contrast Media [Iodinated Diagnostic Agents] Shortness Of Breath    SOB   Iohexol Shortness Of Breath     Desc: Respiratory Distress, Laryngedema, diaphoresis, Onset Date: [id number]    Metrizamide Shortness Of Breath    SOB SOB SOB    Latex Rash    Family History  Problem Relation Age of Onset   Hypertension Mother    Diabetes Mother    Prostate cancer Neg Hx    Bladder Cancer Neg  Hx    Kidney disease Neg Hx      Prior to Admission medications   Medication Sig Start Date End Date Taking? Authorizing Provider  atorvastatin (LIPITOR) 80 MG tablet Take 80 mg by mouth at bedtime.    [provider]  bisacodyl (DULCOLAX) 5 MG EC tablet Take 1 tablet (5 mg total) by mouth daily as needed for moderate constipation. 04/10/20   Lorella Nimrod, MD  chlorthalidone (HYGROTON) 25 MG tablet Take 25 mg by mouth daily.    [provider]  diltiazem (CARDIZEM CD) 240 MG 24 hr capsule Take 1 capsule (240 mg total) by mouth daily. 08/05/20   Shelly Coss, MD  divalproex (DEPAKOTE) 500 MG DR tablet Take 500 mg by mouth daily.   02/18/20   [provider]  Ensure Max Protein (ENSURE MAX PROTEIN) LIQD Take 330 mLs (11 oz total) by mouth 2 (two) times daily. 04/10/20   Lorella Nimrod, MD  ferrous sulfate 325 (65 FE) MG tablet Take 325 mg by mouth daily with breakfast.    [provider]  Insulin Glargine (BASAGLAR KWIKPEN) 100 UNIT/ML Inject 20 Units into the skin at bedtime. 08/01/2020   Shelly Coss, MD  isosorbide mononitrate (IMDUR) 120 MG 24 hr tablet Take 1 tablet (120 mg total) by mouth daily. 10/26/20   [name] Mandes, MD  metFORMIN (GLUCOPHAGE) 500 MG tablet Take 500 mg by mouth 2 (two) times daily with a meal.    [provider]  metoprolol succinate (TOPROL-XL) 25 MG 24 hr tablet Take 1 tablet (25 mg total) by mouth daily. Take with or immediately following a meal. 04/10/20   Lorella Nimrod, MD  nitroGLYCERIN (NITROSTAT) 0.4 MG SL tablet Place 1 tablet (0.4 mg total) under the tongue every 5 (five) minutes as needed for chest pain. 10/25/20   [name] Mandes, MD  NOVOLOG FLEXPEN 100 UNIT/ML FlexPen Inject 4 Units into the skin 3 (three) times daily with meals. 04/08/20   [provider]  pantoprazole (PROTONIX) 40 MG tablet Take 1 tablet (40 mg total) by mouth at bedtime for 14 days. 08/17/2020 08/18/20  Shelly Coss, MD  Rivaroxaban (XARELTO) 15 MG TABS tablet Take 1 tablet (15 mg total) by mouth daily with supper. Hold Xarelto for 3 days.  Start on January 09, 2019. 01/09/19   [name], MD    Physical Exam: Vitals:   11/09/20 2230 11/09/20 2240 11/09/20 2241 11/09/20 2300  BP: (!) 152/107   (!) 160/113  Pulse: 90 84 67 68  Resp: (!) 22 (!) 26 (!) 29 (!) 22  Temp:      TempSrc:      SpO2: 91% (!) 87% 93% 96%  Weight:      Height:        Constitutional: NAD, calm, comfortable, elderly male appearing younger than stated age laying at 27 degree incline in bed Vitals:   11/09/20 2230 11/09/20 2240 11/09/20 2241 11/09/20 2300  BP: (!) 152/107   (!) 160/113  Pulse: 90 84 67 68  Resp:  (!) 22 (!) 26 (!) 29 (!) 22  Temp:      TempSrc:      SpO2: 91% (!) 87% 93% 96%  Weight:      Height:       Eyes: PERRL, lids and conjunctivae normal ENMT: Mucous membranes are moist.  Neck: normal, supple Respiratory: clear to auscultation bilaterally, no wheezing, no crackles. Normal respiratory effort. No accessory muscle use.  Cardiovascular: Irregularly irregular rate and rhythm, no  murmurs / rubs / gallops. No extremity edema. 2+ pedal pulses.  Abdomen: no tenderness, no masses palpated. Bowel sounds positive.  Musculoskeletal: no clubbing / cyanosis.  Bilateral lower extremity BKA with prosthetic limb  skin: no rashes, lesions, ulcers. No induration Neurologic: CN 2-12 grossly intact. Sensation intact,  Strength 5/5 in all 4.  Psychiatric: Normal judgment and insight. Alert and oriented x 3. Normal mood.     Labs on Admission: I have personally reviewed following labs and imaging studies  CBC: Recent Labs  Lab 11/09/20 2115  WBC 8.8  HGB 14.0  HCT 40.4  MCV 87.6  PLT 097   Basic Metabolic Panel: Recent Labs  Lab 11/09/20 2115  NA 133*  K 3.8  CL 98  CO2 23  GLUCOSE 587*  BUN 38*  CREATININE 1.55*  CALCIUM 8.8*   GFR: Estimated Creatinine Clearance: 38.5 mL/min (A) (by C-G formula based on SCr of 1.55 mg/dL (H)). Liver Function Tests: No results for input(s): AST, ALT, ALKPHOS, BILITOT, PROT, ALBUMIN in the last 168 hours. No results for input(s): LIPASE, AMYLASE in the last 168 hours. No results for input(s): AMMONIA in the last 168 hours. Coagulation Profile: Recent Labs  Lab 11/09/20 2115  INR 1.3*   Cardiac Enzymes: No results for input(s): CKTOTAL, CKMB, CKMBINDEX, TROPONINI in the last 168 hours. BNP (last 3 results) No results for input(s): PROBNP in the last 8760 hours. HbA1C: No results for input(s): HGBA1C in the last 72 hours. CBG: Recent Labs  Lab 11/09/20 2327  GLUCAP 409*   Lipid Profile: No results for input(s): CHOL, HDL,  LDLCALC, TRIG, CHOLHDL, LDLDIRECT in the last 72 hours. Thyroid Function Tests: No results for input(s): TSH, T4TOTAL, FREET4, T3FREE, THYROIDAB in the last 72 hours. Anemia Panel: No results for input(s): VITAMINB12, FOLATE, FERRITIN, TIBC, IRON, RETICCTPCT in the last 72 hours. Urine analysis:    Component Value Date/Time   COLORURINE YELLOW (A) 07/28/2020 1255   APPEARANCEUR HAZY (A) 07/28/2020 1255   LABSPEC 1.019 07/28/2020 1255   PHURINE 5.0 07/28/2020 1255   GLUCOSEU NEGATIVE 07/28/2020 1255   HGBUR LARGE (A) 07/28/2020 1255   BILIRUBINUR NEGATIVE 07/28/2020 1255   KETONESUR NEGATIVE 07/28/2020 1255   PROTEINUR NEGATIVE 07/28/2020 1255   NITRITE NEGATIVE 07/28/2020 [place] 07/28/2020 1255    Radiological Exams on Admission: DG Chest 2 View  Result Date: 11/09/2020 CLINICAL DATA:  Chest pain for 1 hour EXAM: CHEST - 2 VIEW COMPARISON:  10/24/2020 FINDINGS: Cardiac shadow is stable. Aortic calcifications are noted. The lungs are well aerated bilaterally. No focal infiltrate or effusion is seen. No acute bony abnormality is noted. Chronic degenerative changes of the thoracic spine are noted. IMPRESSION: No acute abnormality seen. Electronically Signed   By: Inez Catalina M.D.   On: 11/09/2020 21:47      Assessment/Plan  Unstable angina/elevated troponin -now chest pain free. He was seen several weeks ago for the same and medical managment was recommended by cardiology -continue Xarelto  -continue aspirin, statin, beta blocker, Imdur -appreciate cardiology recommendation tomorrow  Hyperglycemia with type 2 diabetes -BG up to 500 -Start IV insulin infusion - replete potassium - continue insulin gtt with goal of 140-180 and AG <12 - IV NS until BG <250, then switch to D5 1/2 NS  - BMP q4hr  - keep NPO   Paroxysmal atrial fibrillation - Continue Cardizem  HTN -continue chlorthalidone  GERD  -continue PPI  DVT prophylaxis:Xarelto Code Status:  Full Family Communication: Plan  discussed with patient at bedside  disposition Plan: Home with observation Consults called:  Admission status: Observation  Level of care: Stepdown  Status is: Observation  The patient remains OBS appropriate and will d/c before 2 midnights.  Dispo: The patient is from: Home              Anticipated d/c is to: Home              Patient currently is not medically stable to d/c.   Difficult to place patient No         Orene Desanctis DO [facility]   If 7PM-7AM, please contact night-coverage [email or web address]   11/10/2020, 12:44 AM

## 2020-11-10 NOTE — ED Notes (Signed)
Pt requesting to call wife. Provided phone. Pt complaining that has not seen doctor. Explained that pt is being cared for by doctors and they will round on him when they see it appropriate.

## 2020-11-10 NOTE — Consult Note (Signed)
[facility] Consultation Note  Patient ID: [name], MRN: [id number], DOB/AGE: December 28, 1934 86 y.o. Admit date: 11/09/2020   Date of Consult: 11/10/2020 Primary Physician: Ezequiel Kayser, MD Primary Cardiologist: Nehemiah Massed  Chief Complaint:  Chief Complaint  Patient presents with   Chest Pain   Reason for Consult:  Chest pain  HPI: 86 y.o. male with known coronary artery disease hypertension hyperlipidemia severe peripheral vascular disease status post bilateral amputations paroxysmal nonvalvular atrial fibrillation who has had known recurrent episodes of chest discomfort.  This is continuing to be consistent and had a cardiac catheterization in 2021 showing 100% ostial LAD stenosis 30% ostial circumflex throughout the artery and a 50% right coronary artery stenosis with overall LV systolic dysfunction and ejection fraction of 35 to 40%.  At that time there was no coronary artery disease amenable to PCI and stent placement.  Since then he continues to have chest discomfort consistent with his chest discomfort and while he was eating dinner.  This is also most consistent with his typical angina.  We have maximized his medication management for this issue including isosorbide and metoprolol.  He did have an increase in his isosorbide to a higher dose as well as sometimes taking nitroglycerin.  This is not particularly helped these intermittent episodes of chest discomfort.  His EKG shows normal sinus rhythm with inferior lateral ST depression unchanged from multiple EKGs earlier this year.  Troponin is 190/226/132.  No current evidence of acute coronary syndrome.  He is on appropriate medication management for angina including metoprolol as well as for hypertension control.  Xarelto is being used for risk reduction and stroke with atrial fibrillation with no evidence of significant bleeding complications.  Currently he is hemodynamically stable and without angina  Past Medical  History:  Diagnosis Date   Arthritis    Atrial fibrillation ([place])    Carcinoma of prostate ([place])    CHF (congestive heart failure) ([place])    Chronic kidney disease    Diabetes mellitus without complication [facility])    ED (erectile dysfunction)    Frequent urination    Hyperlipidemia    Hypertension    Moderate mitral insufficiency    Peripheral vascular disease ([place])    Pneumonia 04/2016   Prostate cancer ([place])    Sleep apnea    OSA--USE C-PAP   Ulcer of left foot due to type 2 diabetes mellitus ([place])    Urinary stress incontinence, male       Surgical History:  Past Surgical History:  Procedure Laterality Date   AMPUTATION Left 01/12/2017   Procedure: AMPUTATION BELOW KNEE;  Surgeon: Algernon Huxley, MD;  Location: [facility];  Service: General;  Laterality: Left;   AMPUTATION Right 12/27/2018   Procedure: AMPUTATION BELOW KNEE;  Surgeon: Algernon Huxley, MD;  Location: [facility];  Service: General;  Laterality: Right;   APLIGRAFT PLACEMENT Left 10/15/2016   Procedure: APLIGRAFT PLACEMENT;  Surgeon: Albertine Patricia, DPM;  Location: [facility];  Service: Podiatry;  Laterality: Left;  necrotic ulcer   CHOLECYSTECTOMY     COLONOSCOPY WITH PROPOFOL N/A 01/02/2019   Procedure: COLONOSCOPY WITH PROPOFOL;  Surgeon: Lucilla Lame, MD;  Location: [facility] ENDOSCOPY;  Service: Endoscopy;  Laterality: N/A;   COLONOSCOPY WITH PROPOFOL N/A 01/05/2019   Procedure: COLONOSCOPY WITH PROPOFOL;  Surgeon: Lucilla Lame, MD;  Location: [facility] ENDOSCOPY;  Service: Endoscopy;  Laterality: N/A;   ESOPHAGOGASTRODUODENOSCOPY (EGD) WITH PROPOFOL N/A 01/02/2019   Procedure: ESOPHAGOGASTRODUODENOSCOPY (EGD) WITH PROPOFOL;  Surgeon: Lucilla Lame, MD;  Location: [facility] ENDOSCOPY;  Service: Endoscopy;  Laterality: N/A;   EYE SURGERY Bilateral    Cataract Extraction with IOL   HIP ARTHROPLASTY Right 10/13/2018   Procedure: ARTHROPLASTY BIPOLAR HIP (HEMIARTHROPLASTY);  Surgeon: Earnestine Leys, MD;  Location: [facility];  Service:  Orthopedics;  Laterality: Right;   IRRIGATION AND DEBRIDEMENT FOOT Left 10/15/2016   Procedure: IRRIGATION AND DEBRIDEMENT FOOT-EXCISIONAL DEBRIDEMENT OF SKIN 3RD, 4TH AND 5TH TOES WITH APPLICATION OF APLIGRAFT;  Surgeon: Albertine Patricia, DPM;  Location: [facility];  Service: Podiatry;  Laterality: Left;  necrotic,gangrene   IRRIGATION AND DEBRIDEMENT FOOT Left 12/09/2016   Procedure: IRRIGATION AND DEBRIDEMENT FOOT-MUSCLE/FASCIA, RESECTION OF FOURTH AND FIFTH METATARSAL NECROTIC BONE;  Surgeon: Albertine Patricia, DPM;  Location: [facility];  Service: Podiatry;  Laterality: Left;   IRRIGATION AND DEBRIDEMENT FOOT Right 12/23/2018   Procedure: IRRIGATION AND DEBRIDEMENT FOOT and wound vac application;  Surgeon: Samara Deist, DPM;  Location: [facility];  Service: Podiatry;  Laterality: Right;   LEFT HEART CATH N/A 06/25/2019   Procedure: Left Heart Cath and Coronary Angiography;  Surgeon: Isaias Cowman, MD;  Location: [place] [facility];  Service: Cardiovascular;  Laterality: N/A;   LEFT HEART CATH AND CORONARY ANGIOGRAPHY N/A 06/05/2018   Procedure: LEFT HEART CATH AND CORONARY ANGIOGRAPHY;  Surgeon: Yolonda Kida, MD;  Location: [place] [facility];  Service: Cardiovascular;  Laterality: N/A;   LOWER EXTREMITY ANGIOGRAPHY Left 08/16/2016   Procedure: Lower Extremity Angiography;  Surgeon: Algernon Huxley, MD;  Location: [place] [facility];  Service: Cardiovascular;  Laterality: Left;   LOWER EXTREMITY ANGIOGRAPHY Left 09/01/2016   Procedure: Lower Extremity Angiography;  Surgeon: Algernon Huxley, MD;  Location: [place] [facility];  Service: Cardiovascular;  Laterality: Left;   LOWER EXTREMITY ANGIOGRAPHY Left 10/14/2016   Procedure: Lower Extremity Angiography;  Surgeon: Algernon Huxley, MD;  Location: [place] [facility];  Service: Cardiovascular;  Laterality: Left;   LOWER EXTREMITY ANGIOGRAPHY Left 12/20/2016   Procedure: Lower Extremity Angiography;  Surgeon: Algernon Huxley, MD;  Location: [place] [facility];  Service: Cardiovascular;  Laterality: Left;   LOWER EXTREMITY ANGIOGRAPHY Right 12/18/2018   Procedure: LOWER EXTREMITY ANGIOGRAPHY;  Surgeon: Algernon Huxley, MD;  Location: [place] [facility];  Service: Cardiovascular;  Laterality: Right;   LOWER EXTREMITY INTERVENTION  09/01/2016   Procedure: Lower Extremity Intervention;  Surgeon: Algernon Huxley, MD;  Location: [place] [facility];  Service: Cardiovascular;;   PROSTATE SURGERY     removal   TONSILLECTOMY     as a child     Home Meds: Prior to Admission medications   Medication Sig Start Date End Date Taking? Authorizing Provider  aspirin 81 MG EC tablet Take 81 mg by mouth daily.   Yes [provider]  atorvastatin (LIPITOR) 80 MG tablet Take 80 mg by mouth at bedtime.   Yes [provider]  chlorthalidone (HYGROTON) 25 MG tablet Take 25 mg by mouth daily.   Yes [provider]  divalproex (DEPAKOTE) 500 MG DR tablet Take 500 mg by mouth daily.  02/18/20  Yes [provider]  ferrous sulfate 325 (65 FE) MG tablet Take 325 mg by mouth daily with breakfast.   Yes [provider]  Insulin Glargine (BASAGLAR KWIKPEN) 100 UNIT/ML Inject 20 Units into the skin at bedtime. 08/02/2020  Yes Shelly Coss, MD  isosorbide mononitrate (IMDUR) 120 MG 24 hr tablet Take 1 tablet (120 mg total) by mouth daily. 10/26/20  Yes Posey Pronto,  Sona, MD  metFORMIN (GLUCOPHAGE) 500 MG tablet Take 500 mg by mouth 2 (two) times daily with a meal.   Yes [provider]  metoprolol succinate (TOPROL-XL) 25 MG 24 hr tablet Take 1 tablet (25 mg total) by mouth daily. Take with or immediately following a meal. 04/10/20  Yes Lorella Nimrod, MD  NOVOLOG FLEXPEN 100 UNIT/ML FlexPen Inject 4 Units into the skin 3 (three) times daily with meals. 04/08/20  Yes [provider]  pantoprazole (PROTONIX) 40 MG tablet Take 1 tablet (40 mg total) by mouth at bedtime for 14 days. 07/24/2020 11/10/20 Yes Shelly Coss, MD   Rivaroxaban (XARELTO) 15 MG TABS tablet Take 1 tablet (15 mg total) by mouth daily with supper. Hold Xarelto for 3 days.  Start on January 09, 2019. 01/09/19  Yes [name], MD  TIADYLT ER 120 MG 24 hr capsule Take 120 mg by mouth daily. 11/02/20  Yes [provider]  bisacodyl (DULCOLAX) 5 MG EC tablet Take 1 tablet (5 mg total) by mouth daily as needed for moderate constipation. 04/10/20   Lorella Nimrod, MD  Ensure Max Protein (ENSURE MAX PROTEIN) LIQD Take 330 mLs (11 oz total) by mouth 2 (two) times daily. 04/10/20   Lorella Nimrod, MD  nitroGLYCERIN (NITROSTAT) 0.4 MG SL tablet Place 1 tablet (0.4 mg total) under the tongue every 5 (five) minutes as needed for chest pain. 10/25/20   [name] Mandes, MD    Inpatient Medications:   atorvastatin  80 mg Oral QHS   chlorthalidone  25 mg Oral Daily   diltiazem  240 mg Oral Daily   divalproex  500 mg Oral Daily   ferrous sulfate  325 mg Oral Q breakfast   insulin aspart  0-6 Units Subcutaneous TID WC   insulin glargine  25 Units Subcutaneous Daily   isosorbide mononitrate  120 mg Oral Daily   metoprolol succinate  25 mg Oral Q lunch   pantoprazole  40 mg Oral QHS   Rivaroxaban  15 mg Oral Q supper    lactated ringers 125 mL/hr (11/10/20 1313)    Allergies:  Allergies  Allergen Reactions   Contrast Media [Iodinated Diagnostic Agents] Shortness Of Breath    SOB   Iohexol Shortness Of Breath     Desc: Respiratory Distress, Laryngedema, diaphoresis, Onset Date: 32992426    Metrizamide Shortness Of Breath    SOB SOB SOB    Latex Rash    Social History   Socioeconomic History   Marital status: Married    Spouse name: Not on file   Number of children: Not on file   Years of education: Not on file   Highest education level: Not on file  Occupational History   Occupation: retired  Tobacco Use   Smoking status: Former    Packs/day: 0.25    Pack years: 0.00    Types: Cigarettes    Quit date: 10/14/1994    Years since  quitting: 26.0   Smokeless tobacco: Never  Vaping Use   Vaping Use: Never used  Substance and Sexual Activity   Alcohol use: No    Alcohol/week: 0.0 standard drinks   Drug use: No   Sexual activity: Not on file  Other Topics Concern   Not on file  Social History Narrative   Lives at home with wife, has a wheelchair   Social Determinants of Health   Financial Resource Strain: Not on file  Food Insecurity: Not on file  Transportation Needs: Not on file  Physical Activity: Not on file  Stress: Not on file  Social Connections: Not on file  Intimate Partner Violence: Not on file     Family History  Problem Relation Age of Onset   Hypertension Mother    Diabetes Mother    Prostate cancer Neg Hx    Bladder Cancer Neg Hx    Kidney disease Neg Hx      Review of Systems Positive for chest pain Negative for: General:  chills, fever, night sweats or weight changes.  Cardiovascular: PND orthopnea syncope dizziness  Dermatological skin lesions rashes Respiratory: Cough congestion Urologic: Frequent urination urination at night and hematuria Abdominal: negative for nausea, vomiting, diarrhea, bright red blood per rectum, melena, or hematemesis Neurologic: negative for visual changes, and/or hearing changes  All other systems reviewed and are otherwise negative except as noted above.  Labs: No results for input(s): CKTOTAL, CKMB, TROPONINI in the last 72 hours. Lab Results  Component Value Date   WBC 8.8 11/09/2020   HGB 14.0 11/09/2020   HCT 40.4 11/09/2020   MCV 87.6 11/09/2020   PLT 196 11/09/2020    Recent Labs  Lab 11/10/20 0830  NA 139  K 3.7  CL 102  CO2 28  BUN 33*  CREATININE 1.38*  CALCIUM 8.6*  GLUCOSE 146*   Lab Results  Component Value Date   CHOL 154 04/08/2020   HDL 39 (L) 04/08/2020   LDLCALC 101 (H) 04/08/2020   TRIG 72 04/08/2020   Lab Results  Component Value Date   DDIMER 0.76 (H) 10/24/2020    Radiology/Studies:  DG Chest 2  View  Result Date: 11/09/2020 CLINICAL DATA:  Chest pain for 1 hour EXAM: CHEST - 2 VIEW COMPARISON:  10/24/2020 FINDINGS: Cardiac shadow is stable. Aortic calcifications are noted. The lungs are well aerated bilaterally. No focal infiltrate or effusion is seen. No acute bony abnormality is noted. Chronic degenerative changes of the thoracic spine are noted. IMPRESSION: No acute abnormality seen. Electronically Signed   By: Inez Catalina M.D.   On: 11/09/2020 21:47   NM Pulmonary Perfusion  Result Date: 10/24/2020 CLINICAL DATA:  Respiratory failure.  Recent chest pain EXAM: NUCLEAR MEDICINE PERFUSION LUNG SCAN TECHNIQUE: Perfusion images were obtained in multiple projections after intravenous injection of radiopharmaceutical. Views: Anterior posterior, left lateral, right lateral, RPO, LPO, RAO, open. RADIOPHARMACEUTICALS:  4.09 mCi Tc-30m MAA IV COMPARISON:  Chest radiograph October 24, 2020 FINDINGS: Radiotracer uptake is homogeneous and symmetric bilaterally. No perfusion defects evident. IMPRESSION: No perfusion defects evident. No findings indicative of pulmonary embolus. Electronically Signed   By: Lowella Grip III M.D.   On: 10/24/2020 14:01   DG Chest Portable 1 View  Result Date: 10/24/2020 CLINICAL DATA:  Chest pain, hypoxia, vomiting EXAM: PORTABLE CHEST 1 VIEW COMPARISON:  09/13/2020 FINDINGS: Single frontal view of the chest demonstrates a stable cardiac silhouette. No acute airspace disease, effusion, or pneumothorax. No acute bony abnormalities. IMPRESSION: 1. No acute intrathoracic process. Electronically Signed   By: Randa Ngo M.D.   On: 10/24/2020 01:25    EKG: Normal sinus rhythm with inferolateral ST depression unchanged from before  Weights: [facility] Weights   11/09/20 2111  Weight: 89.2 kg     Physical Exam: Blood pressure (!) 161/73, pulse (!) 56, temperature 98.3 F (36.8 C), temperature source Oral, resp. rate (!) 25, height 5\' 7"  (1.702 m), weight 89.2 kg, SpO2 96 %.  Body mass index is 30.8 kg/m. General: Well developed, well nourished, in no acute distress.  Head eyes ears nose throat: Normocephalic, atraumatic, sclera non-icteric, no xanthomas, nares are without discharge. No apparent thyromegaly and/or mass  Lungs: Normal respiratory effort.  no wheezes, no rales, no rhonchi.  Heart: RRR with normal S1 S2. no murmur gallop, no rub, PMI is normal size and placement, carotid upstroke normal without bruit, jugular venous pressure is normal Abdomen: Soft, non-tender, non-distended with normoactive bowel sounds. No hepatomegaly. No rebound/guarding. No obvious abdominal masses. Abdominal aorta is normal size without bruit Extremities: No edema. no cyanosis, no clubbing, no ulcers  Peripheral : 2+ bilateral upper extremity pulses, 2+ bilateral femoral pulses, bilateral BKA neuro: Alert and oriented. No facial asymmetry. No focal deficit. Moves all extremities spontaneously. Musculoskeletal: Normal muscle tone without kyphosis Psych:  Responds to questions appropriately with a normal affect.    Assessment: 86 year old male with known peripheral vascular disease paroxysmal nonvalvular atrial fibrillation hypertension hyperlipidemia coronary artery disease with chronic angina and recent changes in medications without evidence of acute coronary syndrome and or myocardial infarction  Plan: 1.  Continue medication management for coronary artery disease risk factors including metoprolol and isosorbide for chronic stable angina without evidence of myocardial infarction or acute coronary syndrome 2.  No further cardiac diagnostics necessary at this time due to no evidence of congestive heart failure or myocardial infarction 3.  Continue anticoagulation for further risk reduction stroke with atrial fibrillation 4.  Begin ambulation and follow-up for improvements of symptoms and if patient has no further evidence of chest discomfort or true angina okay for discharge home  from cardiac standpoint and follow-up in 1 to 2 weeks  Signed, Corey Skains M.D. [facility] 11/10/2020, 1:40 PM

## 2020-12-13 ENCOUNTER — Other Ambulatory Visit: Payer: Self-pay

## 2020-12-13 ENCOUNTER — Emergency Department: Payer: Medicare Other

## 2020-12-13 ENCOUNTER — Inpatient Hospital Stay
Admission: EM | Admit: 2020-12-13 | Discharge: 2020-12-15 | DRG: 281 | Disposition: A | Discharge status: Home / Self Care | Payer: Medicare Other | Attending: Internal Medicine | Admitting: Internal Medicine

## 2020-12-13 DIAGNOSIS — Z794 Long term (current) use of insulin: Secondary | ICD-10-CM

## 2020-12-13 DIAGNOSIS — I252 Old myocardial infarction: Secondary | ICD-10-CM

## 2020-12-13 DIAGNOSIS — Z833 Family history of diabetes mellitus: Secondary | ICD-10-CM

## 2020-12-13 DIAGNOSIS — Z89511 Acquired absence of right leg below knee: Secondary | ICD-10-CM | POA: Diagnosis not present

## 2020-12-13 DIAGNOSIS — Z87891 Personal history of nicotine dependence: Secondary | ICD-10-CM

## 2020-12-13 DIAGNOSIS — I10 Essential (primary) hypertension: Secondary | ICD-10-CM | POA: Diagnosis present

## 2020-12-13 DIAGNOSIS — Z89512 Acquired absence of left leg below knee: Secondary | ICD-10-CM

## 2020-12-13 DIAGNOSIS — Z79899 Other long term (current) drug therapy: Secondary | ICD-10-CM

## 2020-12-13 DIAGNOSIS — Z20822 Contact with and (suspected) exposure to covid-19: Secondary | ICD-10-CM | POA: Diagnosis present

## 2020-12-13 DIAGNOSIS — E1151 Type 2 diabetes mellitus with diabetic peripheral angiopathy without gangrene: Secondary | ICD-10-CM | POA: Diagnosis present

## 2020-12-13 DIAGNOSIS — Z7982 Long term (current) use of aspirin: Secondary | ICD-10-CM

## 2020-12-13 DIAGNOSIS — R778 Other specified abnormalities of plasma proteins: Secondary | ICD-10-CM

## 2020-12-13 DIAGNOSIS — I130 Hypertensive heart and chronic kidney disease with heart failure and stage 1 through stage 4 chronic kidney disease, or unspecified chronic kidney disease: Secondary | ICD-10-CM | POA: Diagnosis present

## 2020-12-13 DIAGNOSIS — K219 Gastro-esophageal reflux disease without esophagitis: Secondary | ICD-10-CM | POA: Diagnosis present

## 2020-12-13 DIAGNOSIS — Z7984 Long term (current) use of oral hypoglycemic drugs: Secondary | ICD-10-CM | POA: Diagnosis not present

## 2020-12-13 DIAGNOSIS — Z888 Allergy status to other drugs, medicaments and biological substances status: Secondary | ICD-10-CM

## 2020-12-13 DIAGNOSIS — Z8249 Family history of ischemic heart disease and other diseases of the circulatory system: Secondary | ICD-10-CM

## 2020-12-13 DIAGNOSIS — I2510 Atherosclerotic heart disease of native coronary artery without angina pectoris: Secondary | ICD-10-CM | POA: Diagnosis present

## 2020-12-13 DIAGNOSIS — Z8546 Personal history of malignant neoplasm of prostate: Secondary | ICD-10-CM

## 2020-12-13 DIAGNOSIS — Z8616 Personal history of COVID-19: Secondary | ICD-10-CM

## 2020-12-13 DIAGNOSIS — I4891 Unspecified atrial fibrillation: Secondary | ICD-10-CM | POA: Diagnosis present

## 2020-12-13 DIAGNOSIS — I480 Paroxysmal atrial fibrillation: Secondary | ICD-10-CM | POA: Diagnosis present

## 2020-12-13 DIAGNOSIS — E860 Dehydration: Secondary | ICD-10-CM | POA: Diagnosis not present

## 2020-12-13 DIAGNOSIS — E1122 Type 2 diabetes mellitus with diabetic chronic kidney disease: Secondary | ICD-10-CM | POA: Diagnosis present

## 2020-12-13 DIAGNOSIS — I5042 Chronic combined systolic (congestive) and diastolic (congestive) heart failure: Secondary | ICD-10-CM | POA: Diagnosis present

## 2020-12-13 DIAGNOSIS — Z91040 Latex allergy status: Secondary | ICD-10-CM

## 2020-12-13 DIAGNOSIS — G40909 Epilepsy, unspecified, not intractable, without status epilepticus: Secondary | ICD-10-CM | POA: Diagnosis present

## 2020-12-13 DIAGNOSIS — E785 Hyperlipidemia, unspecified: Secondary | ICD-10-CM | POA: Diagnosis present

## 2020-12-13 DIAGNOSIS — I25118 Atherosclerotic heart disease of native coronary artery with other forms of angina pectoris: Secondary | ICD-10-CM

## 2020-12-13 DIAGNOSIS — N1831 Chronic kidney disease, stage 3a: Secondary | ICD-10-CM | POA: Diagnosis present

## 2020-12-13 DIAGNOSIS — N179 Acute kidney failure, unspecified: Secondary | ICD-10-CM | POA: Diagnosis present

## 2020-12-13 DIAGNOSIS — E1165 Type 2 diabetes mellitus with hyperglycemia: Secondary | ICD-10-CM | POA: Diagnosis present

## 2020-12-13 DIAGNOSIS — Z91041 Radiographic dye allergy status: Secondary | ICD-10-CM

## 2020-12-13 DIAGNOSIS — I739 Peripheral vascular disease, unspecified: Secondary | ICD-10-CM | POA: Diagnosis present

## 2020-12-13 DIAGNOSIS — I21A1 Myocardial infarction type 2: Secondary | ICD-10-CM | POA: Diagnosis present

## 2020-12-13 DIAGNOSIS — G4733 Obstructive sleep apnea (adult) (pediatric): Secondary | ICD-10-CM | POA: Diagnosis present

## 2020-12-13 DIAGNOSIS — R739 Hyperglycemia, unspecified: Secondary | ICD-10-CM

## 2020-12-13 DIAGNOSIS — I5032 Chronic diastolic (congestive) heart failure: Secondary | ICD-10-CM | POA: Diagnosis present

## 2020-12-13 LAB — BLOOD GAS, VENOUS
Acid-Base Excess: 0.7 mmol/L (ref 0.0–2.0)
Bicarbonate: 25.4 mmol/L (ref 20.0–28.0)
O2 Saturation: 75.5 %
Patient temperature: 37
pCO2, Ven: 40 mmHg — ABNORMAL LOW (ref 44.0–60.0)
pH, Ven: 7.41 (ref 7.250–7.430)
pO2, Ven: 40 mmHg (ref 32.0–45.0)

## 2020-12-13 LAB — CBC WITH DIFFERENTIAL/PLATELET
Abs Immature Granulocytes: 0.04 10*3/uL (ref 0.00–0.07)
Basophils Absolute: 0.1 10*3/uL (ref 0.0–0.1)
Basophils Relative: 1 %
Eosinophils Absolute: 0.2 10*3/uL (ref 0.0–0.5)
Eosinophils Relative: 2 %
HCT: 48.1 % (ref 39.0–52.0)
Hemoglobin: 16.2 g/dL (ref 13.0–17.0)
Immature Granulocytes: 0 %
Lymphocytes Relative: 15 %
Lymphs Abs: 1.8 10*3/uL (ref 0.7–4.0)
MCH: 30.1 pg (ref 26.0–34.0)
MCHC: 33.7 g/dL (ref 30.0–36.0)
MCV: 89.2 fL (ref 80.0–100.0)
Monocytes Absolute: 1.1 10*3/uL — ABNORMAL HIGH (ref 0.1–1.0)
Monocytes Relative: 9 %
Neutro Abs: 8.7 10*3/uL — ABNORMAL HIGH (ref 1.7–7.7)
Neutrophils Relative %: 73 %
Platelets: 232 10*3/uL (ref 150–400)
RBC: 5.39 MIL/uL (ref 4.22–5.81)
RDW: 13 % (ref 11.5–15.5)
WBC: 11.9 10*3/uL — ABNORMAL HIGH (ref 4.0–10.5)
nRBC: 0 % (ref 0.0–0.2)

## 2020-12-13 LAB — BASIC METABOLIC PANEL
Anion gap: 15 (ref 5–15)
BUN: 34 mg/dL — ABNORMAL HIGH (ref 8–23)
CO2: 22 mmol/L (ref 22–32)
Calcium: 9.2 mg/dL (ref 8.9–10.3)
Chloride: 94 mmol/L — ABNORMAL LOW (ref 98–111)
Creatinine, Ser: 2.02 mg/dL — ABNORMAL HIGH (ref 0.61–1.24)
GFR, Estimated: 32 mL/min — ABNORMAL LOW (ref >60)
Glucose, Bld: 751 mg/dL (ref 70–99)
Potassium: 4.1 mmol/L (ref 3.5–5.1)
Sodium: 131 mmol/L — ABNORMAL LOW (ref 135–145)

## 2020-12-13 LAB — TROPONIN I (HIGH SENSITIVITY): Troponin I (High Sensitivity): 161 ng/L (ref <18)

## 2020-12-13 LAB — RESP PANEL BY RT-PCR (FLU A&B, COVID) ARPGX2
Influenza A by PCR: NEGATIVE
Influenza B by PCR: NEGATIVE
SARS Coronavirus 2 by RT PCR: NEGATIVE

## 2020-12-13 LAB — BETA-HYDROXYBUTYRIC ACID: Beta-Hydroxybutyric Acid: 1.8 mmol/L — ABNORMAL HIGH (ref 0.05–0.27)

## 2020-12-13 LAB — CBG MONITORING, ED: Glucose-Capillary: >600 mg/dL (ref 70–99)

## 2020-12-13 MED ORDER — INSULIN DETEMIR 100 UNIT/ML ~~LOC~~ SOLN
15.0000 [IU] | Freq: Every day | SUBCUTANEOUS | Status: DC
  Administered 2020-12-14 (×2): 15 [IU] via SUBCUTANEOUS
  Filled 2020-12-13 (×3): qty 0.15

## 2020-12-13 MED ORDER — DILTIAZEM HCL-DEXTROSE 125-5 MG/125ML-% IV SOLN (PREMIX): 5.0000 mg/h | INTRAVENOUS | Status: DC

## 2020-12-13 MED ORDER — SODIUM CHLORIDE 0.9 % IV BOLUS
1000.0000 mL | Freq: Once | INTRAVENOUS | Status: AC
  Administered 2020-12-13: 1000 mL via INTRAVENOUS

## 2020-12-13 MED ORDER — NITROGLYCERIN 0.4 MG SL SUBL: 0.4000 mg | SUBLINGUAL_TABLET | SUBLINGUAL | Status: DC | PRN

## 2020-12-13 MED ORDER — METOPROLOL SUCCINATE ER 50 MG PO TB24
25.0000 mg | ORAL_TABLET | Freq: Every day | ORAL | Status: DC
  Administered 2020-12-14: 25 mg via ORAL
  Filled 2020-12-13: qty 1

## 2020-12-13 MED ORDER — SODIUM CHLORIDE 0.9 % IV SOLN: Freq: Once | INTRAVENOUS | Status: AC

## 2020-12-13 MED ORDER — RIVAROXABAN 15 MG PO TABS
15.0000 mg | ORAL_TABLET | Freq: Every day | ORAL | Status: DC
  Administered 2020-12-14: 15 mg via ORAL
  Filled 2020-12-13: qty 1

## 2020-12-13 MED ORDER — INSULIN ASPART 100 UNIT/ML IJ SOLN
0.0000 [IU] | Freq: Three times a day (TID) | INTRAMUSCULAR | Status: DC
  Administered 2020-12-14 (×2): 15 [IU] via SUBCUTANEOUS
  Administered 2020-12-14: 3 [IU] via SUBCUTANEOUS
  Administered 2020-12-15: 2 [IU] via SUBCUTANEOUS
  Filled 2020-12-13 (×4): qty 1

## 2020-12-13 MED ORDER — ASPIRIN 81 MG PO CHEW
324.0000 mg | CHEWABLE_TABLET | Freq: Once | ORAL | Status: AC
  Administered 2020-12-13: 324 mg via ORAL
  Filled 2020-12-13: qty 4

## 2020-12-13 MED ORDER — ASPIRIN EC 81 MG PO TBEC
81.0000 mg | DELAYED_RELEASE_TABLET | Freq: Every day | ORAL | Status: DC
  Administered 2020-12-14 – 2020-12-15 (×2): 81 mg via ORAL
  Filled 2020-12-13 (×2): qty 1

## 2020-12-13 MED ORDER — DILTIAZEM HCL 25 MG/5ML IV SOLN
15.0000 mg | Freq: Once | INTRAVENOUS | Status: AC
  Administered 2020-12-13: 15 mg via INTRAVENOUS
  Filled 2020-12-13: qty 5

## 2020-12-13 MED ORDER — ATORVASTATIN CALCIUM 80 MG PO TABS
80.0000 mg | ORAL_TABLET | Freq: Every day | ORAL | Status: DC
  Administered 2020-12-14 (×2): 80 mg via ORAL
  Filled 2020-12-13: qty 4

## 2020-12-13 MED ORDER — ACETAMINOPHEN 650 MG RE SUPP: 650.0000 mg | Freq: Four times a day (QID) | RECTAL | Status: DC | PRN

## 2020-12-13 MED ORDER — ACETAMINOPHEN 325 MG PO TABS: 650.0000 mg | ORAL_TABLET | Freq: Four times a day (QID) | ORAL | Status: DC | PRN

## 2020-12-13 MED ORDER — DIVALPROEX SODIUM 500 MG PO DR TAB
500.0000 mg | DELAYED_RELEASE_TABLET | Freq: Every day | ORAL | Status: DC
  Administered 2020-12-14 – 2020-12-15 (×2): 500 mg via ORAL
  Filled 2020-12-13 (×2): qty 1

## 2020-12-13 NOTE — ED Triage Notes (Signed)
Pt was out with family and started "feeling funny" and experienced chest palpitations. Per EMS HR ranged between 120-160s. Hx of afib - states hes semi-compliant with his Cardizem medication. Denies CP or SOB. Pt received 1 nitro pill by family PTA.

## 2020-12-13 NOTE — ED Provider Notes (Signed)
[facility] Emergency Department Provider Note  ____________________________________________   I have reviewed the triage vital signs and the nursing notes.   HISTORY  Chief Complaint Atrial Fibrillation (Afib w/ RVR)   History limited by:  poor historian   HPI [name] is a 86 y.o. male who presents to the emergency department today via EMS because of concern for chest palpitations. Has a history of atrial fibrillation and apparently is not taking medication as prescribed. On my exam the patient has a hard time giving a history of what is going on. Is complaining of chest pain and states it started tonight but cannot tell me what was going on when it started. He denies any shortness of breath. Also found to be hyperglycemic by first responders.   Records reviewed. Per medical record review patient has a history of atrial fibrillation, DM.   Past Medical History:  Diagnosis Date   Arthritis    Atrial fibrillation ([place])    Carcinoma of prostate ([place])    CHF (congestive heart failure) ([place])    Chronic kidney disease    Diabetes mellitus without complication [facility])    ED (erectile dysfunction)    Frequent urination    Hyperlipidemia    Hypertension    Moderate mitral insufficiency    Peripheral vascular disease ([place])    Pneumonia 04/2016   Prostate cancer ([place])    Sleep apnea    OSA--USE C-PAP   Ulcer of left foot due to type 2 diabetes mellitus ([place])    Urinary stress incontinence, male     Patient Active Problem List   Diagnosis Date Noted   Hyperglycemia due to diabetes mellitus ([place]) 11/10/2020   GERD (gastroesophageal reflux disease) 11/10/2020   Chest pain 11/09/2020   Acute encephalopathy 07/29/2020   Lactic acidosis 07/28/2020   Hypoxic 07/28/2020   Chronic prescription opiate use 07/28/2020   Elevated troponin 07/28/2020   Chronic anticoagulation 06/25/2020   Chronic kidney disease, stage 3a ([place]) 06/25/2020   AMS  (altered mental status) 04/08/2020   CAD (coronary artery disease) 03/06/2020   Personal history of COVID-19 03/06/2020   Acute toxic-metabolic encephalopathy [id number]   UTI (urinary tract infection) 03/06/2020   (HFpEF) heart failure with preserved ejection fraction ([place]) 03/06/2020   Goals of care, counseling/discussion    Palliative care by specialist    Seizure disorder ([place]) 11/30/2019   ACS (acute coronary syndrome) ([place]) 06/24/2019   Sinus bradycardia on ECG 05/16/2019   PAF (paroxysmal atrial fibrillation) ([place]) 03/27/2019   Hx of BKA, right ([place]) 03/02/2019   Severe protein-calorie malnutrition ([place]) [id number]   Acute systolic CHF (congestive heart failure) ([place]) 02/16/2019   Acute respiratory failure with hypoxia ([place]) 02/08/2019   Pneumonia due to COVID-19 virus 02/08/2019   Below-knee amputation of right lower extremity ([place]) 01/15/2019   Polyp of descending colon    Iron deficiency anemia due to chronic blood loss    Sacral decubitus ulcer, stage II ([place]) 12/26/2018   Acute respiratory failure ([place]) 12/20/2018   Femur fracture ([place]) 10/13/2018   Nonspecific chest pain 06/03/2018   NSTEMI (non-ST elevated myocardial infarction) ([place]) 01/16/2018   Acute CHF (congestive heart failure) ([place]) 11/23/2017   Atrial fibrillation with rapid ventricular response ([place]) 10/15/2017   BKA stump complication ([place]) 0000000   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 ([place]) 02/10/2017   S/P bilateral BKA (below knee amputation) ([place]) 02/01/2017   Chronic diastolic heart failure ([place]) 01/21/2017   Pressure injury of skin 01/18/2017  Atherosclerosis of artery of extremity with gangrene ([place]) 01/05/2017   Diabetic foot ulcer ([place]) 12/29/2016   Ataxia 10/21/2016   Atherosclerosis of native arteries of the extremities with ulceration ([place]) 10/12/2016   History of femoral angiogram 09/06/2016   Left leg pain 09/06/2016   Leukocytosis 09/06/2016   Generalized weakness  09/06/2016   AKI (acute kidney injury) ([place]) 09/06/2016   Atrial fibrillation, chronic ([place]) 08/30/2016   Pneumonia 05/19/2016   Hyperlipidemia 04/20/2016   Back pain 04/19/2016   DM (diabetes mellitus), type 2 with complications ([place]) 0000000   Primary hypertension 04/19/2016   PAD (peripheral artery disease) ([place]) 04/19/2016   Erectile dysfunction following radical prostatectomy 06/25/2015   History of prostate cancer 12/23/2014   Incontinence 12/23/2014    Past Surgical History:  Procedure Laterality Date   AMPUTATION Left 01/12/2017   Procedure: AMPUTATION BELOW KNEE;  Surgeon: Algernon Huxley, MD;  Location: [facility] ORS;  Service: General;  Laterality: Left;   AMPUTATION Right 12/27/2018   Procedure: AMPUTATION BELOW KNEE;  Surgeon: Algernon Huxley, MD;  Location: [facility] ORS;  Service: General;  Laterality: Right;   APLIGRAFT PLACEMENT Left 10/15/2016   Procedure: APLIGRAFT PLACEMENT;  Surgeon: Albertine Patricia, DPM;  Location: [facility] ORS;  Service: Podiatry;  Laterality: Left;  necrotic ulcer   CHOLECYSTECTOMY     COLONOSCOPY WITH PROPOFOL N/A 01/02/2019   Procedure: COLONOSCOPY WITH PROPOFOL;  Surgeon: Lucilla Lame, MD;  Location: [facility] ENDOSCOPY;  Service: Endoscopy;  Laterality: N/A;   COLONOSCOPY WITH PROPOFOL N/A 01/05/2019   Procedure: COLONOSCOPY WITH PROPOFOL;  Surgeon: Lucilla Lame, MD;  Location: [facility] ENDOSCOPY;  Service: Endoscopy;  Laterality: N/A;   ESOPHAGOGASTRODUODENOSCOPY (EGD) WITH PROPOFOL N/A 01/02/2019   Procedure: ESOPHAGOGASTRODUODENOSCOPY (EGD) WITH PROPOFOL;  Surgeon: Lucilla Lame, MD;  Location: [facility] ENDOSCOPY;  Service: Endoscopy;  Laterality: N/A;   EYE SURGERY Bilateral    Cataract Extraction with IOL   HIP ARTHROPLASTY Right 10/13/2018   Procedure: ARTHROPLASTY BIPOLAR HIP (HEMIARTHROPLASTY);  Surgeon: Earnestine Leys, MD;  Location: [facility] ORS;  Service: Orthopedics;  Laterality: Right;   IRRIGATION AND DEBRIDEMENT FOOT Left 10/15/2016   Procedure: IRRIGATION AND  DEBRIDEMENT FOOT-EXCISIONAL DEBRIDEMENT OF SKIN 3RD, 4TH AND 5TH TOES WITH APPLICATION OF APLIGRAFT;  Surgeon: Albertine Patricia, DPM;  Location: [facility] ORS;  Service: Podiatry;  Laterality: Left;  necrotic,gangrene   IRRIGATION AND DEBRIDEMENT FOOT Left 12/09/2016   Procedure: IRRIGATION AND DEBRIDEMENT FOOT-MUSCLE/FASCIA, RESECTION OF FOURTH AND FIFTH METATARSAL NECROTIC BONE;  Surgeon: Albertine Patricia, DPM;  Location: [facility] ORS;  Service: Podiatry;  Laterality: Left;   IRRIGATION AND DEBRIDEMENT FOOT Right 12/23/2018   Procedure: IRRIGATION AND DEBRIDEMENT FOOT and wound vac application;  Surgeon: Samara Deist, DPM;  Location: [facility] ORS;  Service: Podiatry;  Laterality: Right;   LEFT HEART CATH N/A 06/25/2019   Procedure: Left Heart Cath and Coronary Angiography;  Surgeon: Isaias Cowman, MD;  Location: [place] CV LAB;  Service: Cardiovascular;  Laterality: N/A;   LEFT HEART CATH AND CORONARY ANGIOGRAPHY N/A 06/05/2018   Procedure: LEFT HEART CATH AND CORONARY ANGIOGRAPHY;  Surgeon: Yolonda Kida, MD;  Location: [place] CV LAB;  Service: Cardiovascular;  Laterality: N/A;   LOWER EXTREMITY ANGIOGRAPHY Left 08/16/2016   Procedure: Lower Extremity Angiography;  Surgeon: Algernon Huxley, MD;  Location: [place] CV LAB;  Service: Cardiovascular;  Laterality: Left;   LOWER EXTREMITY ANGIOGRAPHY Left 09/01/2016   Procedure: Lower Extremity Angiography;  Surgeon: Algernon Huxley, MD;  Location: [place] CV LAB;  Service: Cardiovascular;  Laterality: Left;  LOWER EXTREMITY ANGIOGRAPHY Left 10/14/2016   Procedure: Lower Extremity Angiography;  Surgeon: Algernon Huxley, MD;  Location: [place] CV LAB;  Service: Cardiovascular;  Laterality: Left;   LOWER EXTREMITY ANGIOGRAPHY Left 12/20/2016   Procedure: Lower Extremity Angiography;  Surgeon: Algernon Huxley, MD;  Location: [place] CV LAB;  Service: Cardiovascular;  Laterality: Left;   LOWER EXTREMITY ANGIOGRAPHY Right 12/18/2018    Procedure: LOWER EXTREMITY ANGIOGRAPHY;  Surgeon: Algernon Huxley, MD;  Location: [place] CV LAB;  Service: Cardiovascular;  Laterality: Right;   LOWER EXTREMITY INTERVENTION  09/01/2016   Procedure: Lower Extremity Intervention;  Surgeon: Algernon Huxley, MD;  Location: [place] CV LAB;  Service: Cardiovascular;;   PROSTATE SURGERY     removal   TONSILLECTOMY     as a child    Prior to Admission medications   Medication Sig Start Date End Date Taking? Authorizing Provider  aspirin 81 MG EC tablet Take 81 mg by mouth daily.    [provider]  atorvastatin (LIPITOR) 80 MG tablet Take 80 mg by mouth at bedtime.    [provider]  bisacodyl (DULCOLAX) 5 MG EC tablet Take 1 tablet (5 mg total) by mouth daily as needed for moderate constipation. 04/10/20   Lorella Nimrod, MD  chlorthalidone (HYGROTON) 25 MG tablet Take 25 mg by mouth daily.    [provider]  divalproex (DEPAKOTE) 500 MG DR tablet Take 500 mg by mouth daily.  02/18/20   [provider]  Ensure Max Protein (ENSURE MAX PROTEIN) LIQD Take 330 mLs (11 oz total) by mouth 2 (two) times daily. 04/10/20   Lorella Nimrod, MD  ferrous sulfate 325 (65 FE) MG tablet Take 325 mg by mouth daily with breakfast.    [provider]  Insulin Glargine (BASAGLAR KWIKPEN) 100 UNIT/ML Inject 20 Units into the skin 2 (two) times daily. 11/10/20   Sharen Hones, MD  isosorbide mononitrate (IMDUR) 120 MG 24 hr tablet Take 1 tablet (120 mg total) by mouth daily. 10/26/20   [name] Mandes, MD  metFORMIN (GLUCOPHAGE) 500 MG tablet Take 500 mg by mouth 2 (two) times daily with a meal.    [provider]  metoprolol succinate (TOPROL-XL) 25 MG 24 hr tablet Take 1 tablet (25 mg total) by mouth daily. Take with or immediately following a meal. 04/10/20   Lorella Nimrod, MD  nitroGLYCERIN (NITROSTAT) 0.4 MG SL tablet Place 1 tablet (0.4 mg total) under the tongue every 5 (five) minutes as needed for chest pain.  10/25/20   [name] Mandes, MD  NOVOLOG FLEXPEN 100 UNIT/ML FlexPen Inject 6 Units into the skin 3 (three) times daily with meals. 11/10/20   Sharen Hones, MD  pantoprazole (PROTONIX) 40 MG tablet Take 1 tablet (40 mg total) by mouth at bedtime for 14 days. 08/16/2020 11/10/20  Shelly Coss, MD  Rivaroxaban (XARELTO) 15 MG TABS tablet Take 1 tablet (15 mg total) by mouth daily with supper. Hold Xarelto for 3 days.  Start on January 09, 2019. 01/09/19   [name], MD  TIADYLT ER 120 MG 24 hr capsule Take 120 mg by mouth daily. 11/02/20   [provider]    Allergies Contrast media [iodinated diagnostic agents], Iohexol, Metrizamide, and Latex  Family History  Problem Relation Age of Onset   Hypertension Mother    Diabetes Mother    Prostate cancer Neg Hx    Bladder Cancer Neg Hx    Kidney disease Neg Hx  Social History Social History   Tobacco Use   Smoking status: Former    Packs/day: 0.25    Types: Cigarettes    Quit date: 10/14/1994    Years since quitting: 26.1   Smokeless tobacco: Never  Vaping Use   Vaping Use: Never used  Substance Use Topics   Alcohol use: No    Alcohol/week: 0.0 standard drinks   Drug use: No    Review of Systems Constitutional: No fever/chills Eyes: No visual changes. ENT: No sore throat. Cardiovascular: Positive for chest pain. Respiratory: Denies shortness of breath. Gastrointestinal: No abdominal pain.  No nausea, no vomiting.  No diarrhea.   Genitourinary: Negative for dysuria. Musculoskeletal: Negative for back pain. Skin: Negative for rash. Neurological: Negative for headaches, focal weakness or numbness.  ____________________________________________   PHYSICAL EXAM:  VITAL SIGNS: ED Triage Vitals  Enc Vitals Group     BP 12/13/20 2130 (!) 148/83     Pulse Rate 12/13/20 2130 (!) 144     Resp 12/13/20 2130 20     Temp 12/13/20 2130 98.2 F (36.8 C)     Temp Source 12/13/20 2130 Oral     SpO2 12/13/20 2114 96 %     Weight  12/13/20 2139 185 lb 8 oz (84.1 kg)     Height 12/13/20 2117 '5\' 7"'$  (1.702 m)     Head Circumference --      Peak Flow --      Pain Score 12/13/20 2116 0    Constitutional: Awake and alert. Does not appear to be completely oriented to events.  Eyes: Conjunctivae are normal.  ENT      Head: Normocephalic and atraumatic.      Nose: No congestion/rhinnorhea.      Mouth/Throat: Mucous membranes are moist.      Neck: No stridor. Hematological/Lymphatic/Immunilogical: No cervical lymphadenopathy. Cardiovascular: Tachycardic, irregularly irregular rhythm. Respiratory: Normal respiratory effort without tachypnea nor retractions. Breath sounds are clear and equal bilaterally. No wheezes/rales/rhonchi. Gastrointestinal: Soft and non tender. No rebound. No guarding.  Genitourinary: Deferred Musculoskeletal: Bilateral BKA Neurologic:  Normal speech and language. No gross focal neurologic deficits are appreciated.  Skin:  Skin is warm, dry and intact. No rash noted. Psychiatric: Mood and affect are normal. Speech and behavior are normal. Patient exhibits appropriate insight and judgment.  ____________________________________________    LABS (pertinent positives/negatives)  CBC wbc 11.9, hgb 16.2, plt 232 Beta-hydroxybutyric acid 1.80 Trop hs 161 Vbg 7.41 BMP na 131, k 4.1, glu 751, cr 2.02 COVID negative ____________________________________________   EKG  I, Nance Pear, attending physician, personally viewed and interpreted this EKG  EKG Time: 2128 Rate: 154 Rhythm: atrial fibrillation with RVR Axis: left axis deviation Intervals: qtc 435 QRS: narrow ST changes: no st elevation Impression: abnormal ekg   ____________________________________________    RADIOLOGY  CXR No acute abnormality  ____________________________________________   PROCEDURES  Procedures  ____________________________________________   INITIAL IMPRESSION / ASSESSMENT AND PLAN / ED  COURSE  Pertinent labs & imaging results that were available during my care of the patient were reviewed by me and considered in my medical decision making (see chart for details).   Patient presents to the emergency department today because of concern for palpitations and chest discomfort. Initially found to be in afib with rvr. Patient was given diltiazem bolus and it did help bring down his heart rate. Additionally patient was found to be hyperglycemic. At this time do not think dka given lack of anion gap or acidosis, however IVFs  were started. CXR without concerning abnormality. Discussed with patient. Will plan on admission.   ____________________________________________   FINAL CLINICAL IMPRESSION(S) / ED DIAGNOSES  Final diagnoses:  Hyperglycemia  Elevated troponin  Atrial fibrillation with RVR ([place])     Note: This dictation was prepared with Dragon dictation. Any transcriptional errors that result from this process are unintentional     Nance Pear, MD 12/13/20 2320

## 2020-12-13 NOTE — H&P (Signed)
History and Physical    [name] [id number] DOB: 05-27-1934 DOA: 12/13/2020  PCP: Ezequiel Kayser, MD    Patient coming from:  Home   Chief Complaint:  A. fib RVR/chest pain   HPI: [name] is a 86 y.o. male seen in ed with complaints of palpitations and chest pain patient is not able to give a detailed history and therefore HPI is limited as patient is a poor historian.  Patient does state that he was eating lunch when he started to have heart pain that he took a nitro and that pain is gone now.  He patient lives with wife at home has 2 children.   Patient's last admission was 11/09/2020 when he was admitted for unstable angina and evaluated by cardiology Dr. Nehemiah Massed with plan for medication management for patient's CAD including metoprolol and Imdur for chronic stable angina without evidence of MI or ACS.  Patient during that visit also had a ventilation/perfusion scan to identify PE which was negative for any findings indicative of pulmonary embolism.  Patient was discharged on 11/10/2020.  Pt has past medical history of coronary artery disease, hypertension, hyperlipidemia, severe peripheral arterial disease status post bilateral amputations, paroxysmal nonvalvular atrial fibrillation, patient's last cardiac cath was in 2021 showing 100% ostial LAD stenosis 30% ostial circumflex throughout the artery and 50% RCA stenosis with overall ejection fraction of 35 to 40%.  ED Course:  Vitals:   12/13/20 2114 12/13/20 2117 12/13/20 2130 12/13/20 2139  BP:   (!) 148/83   Pulse:   (!) 144   Resp:   20   Temp:   98.2 F (36.8 C)   TempSrc:   Oral   SpO2: 96%  99%   Weight:    84.1 kg  Height:  '5\' 7"'$  (1.702 m)    In the emergency room patient is awake alert disoriented, afebrile, tachycardic. EKG done shows A. fib RVR at 154.  Blood sugar is over 600, ABG shows PCO2 of 40 otherwise normal, beta hydroxybutyrate of 1.80, respiratory panel negative for influenza and  COVID.  BMP shows hyponatremia with a sodium of 131 and chloride of 94 secondary to hyperglycemia with a sugar of 751, acute kidney injury with a creatinine of 2.02 with a previous 1 of 1.3, gap of 15, serum bicarb 22.  Troponin of 161, CBC with a hemoglobin of 16.2 white count of 11.9, MCV of 89.2, platelets of 232. Review of Systems:  Review of Systems  Cardiovascular:  Negative for chest pain and palpitations.  All other systems reviewed and are negative.   Past Medical History:  Diagnosis Date   Arthritis    Atrial fibrillation ([place])    Carcinoma of prostate ([place])    CHF (congestive heart failure) ([place])    Chronic kidney disease    Diabetes mellitus without complication [facility])    ED (erectile dysfunction)    Frequent urination    Hyperlipidemia    Hypertension    Moderate mitral insufficiency    Peripheral vascular disease ([place])    Pneumonia 04/2016   Prostate cancer ([place])    Sleep apnea    OSA--USE C-PAP   Ulcer of left foot due to type 2 diabetes mellitus ([place])    Urinary stress incontinence, male     Past Surgical History:  Procedure Laterality Date   AMPUTATION Left 01/12/2017   Procedure: AMPUTATION BELOW KNEE;  Surgeon: Algernon Huxley, MD;  Location: [facility] ORS;  Service: General;  Laterality: Left;  AMPUTATION Right 12/27/2018   Procedure: AMPUTATION BELOW KNEE;  Surgeon: Algernon Huxley, MD;  Location: [facility] ORS;  Service: General;  Laterality: Right;   APLIGRAFT PLACEMENT Left 10/15/2016   Procedure: APLIGRAFT PLACEMENT;  Surgeon: Albertine Patricia, DPM;  Location: [facility] ORS;  Service: Podiatry;  Laterality: Left;  necrotic ulcer   CHOLECYSTECTOMY     COLONOSCOPY WITH PROPOFOL N/A 01/02/2019   Procedure: COLONOSCOPY WITH PROPOFOL;  Surgeon: Lucilla Lame, MD;  Location: [facility] ENDOSCOPY;  Service: Endoscopy;  Laterality: N/A;   COLONOSCOPY WITH PROPOFOL N/A 01/05/2019   Procedure: COLONOSCOPY WITH PROPOFOL;  Surgeon: Lucilla Lame, MD;  Location: [facility] ENDOSCOPY;  Service: Endoscopy;   Laterality: N/A;   ESOPHAGOGASTRODUODENOSCOPY (EGD) WITH PROPOFOL N/A 01/02/2019   Procedure: ESOPHAGOGASTRODUODENOSCOPY (EGD) WITH PROPOFOL;  Surgeon: Lucilla Lame, MD;  Location: [facility] ENDOSCOPY;  Service: Endoscopy;  Laterality: N/A;   EYE SURGERY Bilateral    Cataract Extraction with IOL   HIP ARTHROPLASTY Right 10/13/2018   Procedure: ARTHROPLASTY BIPOLAR HIP (HEMIARTHROPLASTY);  Surgeon: Earnestine Leys, MD;  Location: [facility] ORS;  Service: Orthopedics;  Laterality: Right;   IRRIGATION AND DEBRIDEMENT FOOT Left 10/15/2016   Procedure: IRRIGATION AND DEBRIDEMENT FOOT-EXCISIONAL DEBRIDEMENT OF SKIN 3RD, 4TH AND 5TH TOES WITH APPLICATION OF APLIGRAFT;  Surgeon: Albertine Patricia, DPM;  Location: [facility] ORS;  Service: Podiatry;  Laterality: Left;  necrotic,gangrene   IRRIGATION AND DEBRIDEMENT FOOT Left 12/09/2016   Procedure: IRRIGATION AND DEBRIDEMENT FOOT-MUSCLE/FASCIA, RESECTION OF FOURTH AND FIFTH METATARSAL NECROTIC BONE;  Surgeon: Albertine Patricia, DPM;  Location: [facility] ORS;  Service: Podiatry;  Laterality: Left;   IRRIGATION AND DEBRIDEMENT FOOT Right 12/23/2018   Procedure: IRRIGATION AND DEBRIDEMENT FOOT and wound vac application;  Surgeon: Samara Deist, DPM;  Location: [facility] ORS;  Service: Podiatry;  Laterality: Right;   LEFT HEART CATH N/A 06/25/2019   Procedure: Left Heart Cath and Coronary Angiography;  Surgeon: Isaias Cowman, MD;  Location: [place] CV LAB;  Service: Cardiovascular;  Laterality: N/A;   LEFT HEART CATH AND CORONARY ANGIOGRAPHY N/A 06/05/2018   Procedure: LEFT HEART CATH AND CORONARY ANGIOGRAPHY;  Surgeon: Yolonda Kida, MD;  Location: [place] CV LAB;  Service: Cardiovascular;  Laterality: N/A;   LOWER EXTREMITY ANGIOGRAPHY Left 08/16/2016   Procedure: Lower Extremity Angiography;  Surgeon: Algernon Huxley, MD;  Location: [place] CV LAB;  Service: Cardiovascular;  Laterality: Left;   LOWER EXTREMITY ANGIOGRAPHY Left 09/01/2016   Procedure: Lower Extremity  Angiography;  Surgeon: Algernon Huxley, MD;  Location: [place] CV LAB;  Service: Cardiovascular;  Laterality: Left;   LOWER EXTREMITY ANGIOGRAPHY Left 10/14/2016   Procedure: Lower Extremity Angiography;  Surgeon: Algernon Huxley, MD;  Location: [place] CV LAB;  Service: Cardiovascular;  Laterality: Left;   LOWER EXTREMITY ANGIOGRAPHY Left 12/20/2016   Procedure: Lower Extremity Angiography;  Surgeon: Algernon Huxley, MD;  Location: [place] CV LAB;  Service: Cardiovascular;  Laterality: Left;   LOWER EXTREMITY ANGIOGRAPHY Right 12/18/2018   Procedure: LOWER EXTREMITY ANGIOGRAPHY;  Surgeon: Algernon Huxley, MD;  Location: [place] CV LAB;  Service: Cardiovascular;  Laterality: Right;   LOWER EXTREMITY INTERVENTION  09/01/2016   Procedure: Lower Extremity Intervention;  Surgeon: Algernon Huxley, MD;  Location: [place] CV LAB;  Service: Cardiovascular;;   PROSTATE SURGERY     removal   TONSILLECTOMY     as a child     reports that he quit smoking about 26 years ago. His smoking use included cigarettes. He smoked an average of .25 packs  per day. He has never used smokeless tobacco. He reports that he does not drink alcohol and does not use drugs.  Allergies  Allergen Reactions   Contrast Media [Iodinated Diagnostic Agents] Shortness Of Breath    SOB   Iohexol Shortness Of Breath     Desc: Respiratory Distress, Laryngedema, diaphoresis, Onset Date: [id number]    Metrizamide Shortness Of Breath    SOB SOB SOB    Latex Rash    Family History  Problem Relation Age of Onset   Hypertension Mother    Diabetes Mother    Prostate cancer Neg Hx    Bladder Cancer Neg Hx    Kidney disease Neg Hx     Prior to Admission medications   Medication Sig Start Date End Date Taking? Authorizing Provider  aspirin 81 MG EC tablet Take 81 mg by mouth daily.    [provider]  atorvastatin (LIPITOR) 80 MG tablet Take 80 mg by mouth at bedtime.    [provider]  bisacodyl  (DULCOLAX) 5 MG EC tablet Take 1 tablet (5 mg total) by mouth daily as needed for moderate constipation. 04/10/20   Lorella Nimrod, MD  chlorthalidone (HYGROTON) 25 MG tablet Take 25 mg by mouth daily.    [provider]  divalproex (DEPAKOTE) 500 MG DR tablet Take 500 mg by mouth daily.  02/18/20   [provider]  Ensure Max Protein (ENSURE MAX PROTEIN) LIQD Take 330 mLs (11 oz total) by mouth 2 (two) times daily. 04/10/20   Lorella Nimrod, MD  ferrous sulfate 325 (65 FE) MG tablet Take 325 mg by mouth daily with breakfast.    [provider]  Insulin Glargine (BASAGLAR KWIKPEN) 100 UNIT/ML Inject 20 Units into the skin 2 (two) times daily. 11/10/20   Sharen Hones, MD  isosorbide mononitrate (IMDUR) 120 MG 24 hr tablet Take 1 tablet (120 mg total) by mouth daily. 10/26/20   [name] Mandes, MD  metFORMIN (GLUCOPHAGE) 500 MG tablet Take 500 mg by mouth 2 (two) times daily with a meal.    [provider]  metoprolol succinate (TOPROL-XL) 25 MG 24 hr tablet Take 1 tablet (25 mg total) by mouth daily. Take with or immediately following a meal. 04/10/20   Lorella Nimrod, MD  nitroGLYCERIN (NITROSTAT) 0.4 MG SL tablet Place 1 tablet (0.4 mg total) under the tongue every 5 (five) minutes as needed for chest pain. 10/25/20   [name] Mandes, MD  NOVOLOG FLEXPEN 100 UNIT/ML FlexPen Inject 6 Units into the skin 3 (three) times daily with meals. 11/10/20   Sharen Hones, MD  pantoprazole (PROTONIX) 40 MG tablet Take 1 tablet (40 mg total) by mouth at bedtime for 14 days. 08/21/2020 11/10/20  Shelly Coss, MD  Rivaroxaban (XARELTO) 15 MG TABS tablet Take 1 tablet (15 mg total) by mouth daily with supper. Hold Xarelto for 3 days.  Start on January 09, 2019. 01/09/19   [name], MD  TIADYLT ER 120 MG 24 hr capsule Take 120 mg by mouth daily. 11/02/20   [provider]    Physical Exam: Vitals:   12/13/20 2114 12/13/20 2117 12/13/20 2130 12/13/20 2139  BP:   (!) 148/83   Pulse:   (!)  144   Resp:   20   Temp:   98.2 F (36.8 C)   TempSrc:   Oral   SpO2: 96%  99%   Weight:    84.1 kg  Height:  '5\' 7"'$  (1.702 m)  Physical Exam Vitals and nursing note reviewed.  Constitutional:      General: He is not in acute distress.    Appearance: He is not ill-appearing.  HENT:     Head: Normocephalic and atraumatic.     Right Ear: External ear normal.     Left Ear: External ear normal.     Nose: Nose normal.     Mouth/Throat:     Mouth: Mucous membranes are dry.  Eyes:     Extraocular Movements: Extraocular movements intact.  Cardiovascular:     Rate and Rhythm: Tachycardia present. Rhythm irregular.     Heart sounds: Normal heart sounds.  Pulmonary:     Effort: Pulmonary effort is normal.     Breath sounds: Normal breath sounds.  Abdominal:     General: Bowel sounds are normal. There is no distension.     Palpations: Abdomen is soft.     Tenderness: There is no abdominal tenderness. There is no guarding.  Musculoskeletal:     Right Lower Extremity: Right leg is amputated below knee.     Left Lower Extremity: Left leg is amputated below knee.  Skin:    General: Skin is warm.  Neurological:     General: No focal deficit present.     Mental Status: He is alert. He is disoriented.  Psychiatric:        Mood and Affect: Mood normal.        Behavior: Behavior normal.     Labs on Admission: I have personally reviewed following labs and imaging studies  No results for input(s): CKTOTAL, CKMB, TROPONINI in the last 72 hours. Lab Results  Component Value Date   WBC 11.9 (H) 12/13/2020   HGB 16.2 12/13/2020   HCT 48.1 12/13/2020   MCV 89.2 12/13/2020   PLT 232 12/13/2020    Recent Labs  Lab 12/13/20 2133  NA 131*  K 4.1  CL 94*  CO2 22  BUN 34*  CREATININE 2.02*  CALCIUM 9.2  GLUCOSE 751*   Lab Results  Component Value Date   CHOL 154 04/08/2020   HDL 39 (L) 04/08/2020   LDLCALC 101 (H) 04/08/2020   TRIG 72 04/08/2020   COVID-19 Labs  Lab  Results  Component Value Date   SARSCOV2NAA NEGATIVE 12/13/2020   SARSCOV2NAA NEGATIVE 11/09/2020   [place] NEGATIVE 10/24/2020   Gas NEGATIVE 07/30/2020    Radiological Exams on Admission: DG Chest Portable 1 View  Result Date: 12/13/2020 CLINICAL DATA:  Pt was out with family and started "feeling funny" and experienced chest palpitations. Per EMS HR ranged between 120-160s. Hx of afib - states hes semi-compliant with his Cardizem medication. Denies CP or SOB. EXAM: PORTABLE CHEST 1 VIEW COMPARISON:  11/09/2020 and older exams. FINDINGS: Cardiac silhouette is mildly enlarged. No mediastinal or hilar masses. Clear lungs.  No convincing pleural effusion.  No pneumothorax. Skeletal structures are grossly intact. IMPRESSION: No acute cardiopulmonary disease. Electronically Signed   By: Lajean Manes M.D.   On: 12/13/2020 23:00    12/13/2020 EKG: Independently reviewed.  A. fib RVR at 54.  08/01/2020 Echocardiogram: IMPRESSIONS   1. Left ventricular ejection fraction, by estimation, is 45 to 50%. The  left ventricle has mildly decreased function. The left ventricle has no  regional wall motion abnormalities. Left ventricular diastolic parameters  are consistent with Grade I  diastolic dysfunction (impaired relaxation).   2. Right ventricular systolic function is normal. The right ventricular  size is normal.   3. Right  atrial size was mildly dilated.   4. The mitral valve was not well visualized. Trivial mitral valve  regurgitation.   5. The aortic valve was not well visualized. Aortic valve regurgitation  is trivial.   6. Aortic dilatation noted. There is borderline dilatation of the aortic  root, measuring 36 mm.    Assessment/Plan Principal Problem:   Atrial fibrillation with rapid ventricular response (HCC) Active Problems:   AKI (acute kidney injury) ([place])   Hyperglycemia due to type 2 diabetes mellitus (HCC)   PAD (peripheral artery disease) (HCC)   S/P bilateral  BKA (below knee amputation) (HCC)   CAD (coronary artery disease)   Primary hypertension   Chronic diastolic heart failure (HCC)   Seizure disorder (HCC)   GERD (gastroesophageal reflux disease)   A. fib RVR: We will continue patient on aspirin 81, metoprolol. Patient did receive diltiazem 15 mg in the emergency room and  patient on diltiazem drip and transition to p.o. home regimen of tiadylt in am when pt converts.  Cardiology consult per a.m. MD.  Acute kidney injury: Lab Results  Component Value Date   CREATININE 2.02 (H) 12/13/2020   CREATININE 1.38 (H) 11/10/2020   CREATININE 1.55 (H) 11/09/2020  Blood pressure (!) 148/83, pulse (!) 144, temperature 98.2 F (36.8 C), temperature source Oral, resp. rate 20, height '5\' 7"'$  (1.702 m), weight 84.1 kg, SpO2 99 %. We will hold patient's chlorthalidone, Imdur. Continue with IV hydration, replace and correct electrolytes. Attribute to prerenal etiology, dehydration secondary to hyperglycemia. Renally dose all needed meds and avoid contrast studies.  Hyperglycemia due to diabetes mellitus type 2: Patient's A1c in June was 9.6, I currently held patient's home regimen, patient is started on long-acting insulin overnight, with continued monitoring of sliding scale every 6 hours.  CAD/PAD status post bilateral BKA: We will continue patient on aspirin,statin, Xarelto. Fall precautions aspiration precautions.  Primary hypertension: Home regimen of chlorthalidone held due to acute kidney injury, continue patient on metoprolol and currently on diltiazem drip and therefore Imdur held as well to allow room for blood pressure.   Chronic diastolic heart failure: Stable, continue aspirin and statin and use as needed diuretic therapy as needed only.   Seizure disorder: Seizure precautions, will continue patient on his home regimen of Depakote.  GERD: IV PPI therapy.   DVT prophylaxis:  Xarelto  Code Status:  Full code  Family  Communication:  [place], Both (Spouse)  [phone] (Home Phone)   Disposition Plan:  Home  Consults called:  None  Admission status: Inpatient   Para Skeans MD [facility] [phone] How to contact the [facility] Attending or Consulting provider [place] or covering provider during after hours [place], for this patient.    Check the care team in [facility] and look for a) attending/consulting [facility] provider listed and b) the [facility] team listed Log into [email or web address] and use [place]'s universal password to access. If you do not have the password, please contact the hospital operator. Locate the [facility] provider you are looking for under [facility] and page to a number that you can be directly reached. If you still have difficulty reaching the provider, please page the [facility] (Director on Call) for the Hospitalists listed on amion for assistance. [email or web address] Password [facility] 12/13/2020, 11:55 PM

## 2020-12-13 NOTE — ED Notes (Signed)
Glucose 751, Troponin 161. Dr. Archie Balboa informed via face to face conversation.

## 2020-12-14 ENCOUNTER — Inpatient Hospital Stay
Admit: 2020-12-14 | Discharge: 2020-12-14 | Disposition: A | Discharge status: Home / Self Care | Payer: Medicare Other | Attending: Internal Medicine | Admitting: Internal Medicine

## 2020-12-14 DIAGNOSIS — I4891 Unspecified atrial fibrillation: Secondary | ICD-10-CM | POA: Diagnosis not present

## 2020-12-14 LAB — ECHOCARDIOGRAM COMPLETE
AR max vel: 1.96 cm2
AV Area VTI: 2.25 cm2
AV Area mean vel: 1.75 cm2
AV Mean grad: 2 mmHg
AV Peak grad: 3.9 mmHg
Ao pk vel: 0.99 m/s
Area-P 1/2: 3.19 cm2
Calc EF: 42.5 %
Height: 67 in
MV VTI: 2.44 cm2
S' Lateral: 4.5 cm
Single Plane A2C EF: 45 %
Single Plane A4C EF: 38.3 %
Weight: 2968 oz

## 2020-12-14 LAB — URINALYSIS, COMPLETE (UACMP) WITH MICROSCOPIC
Bilirubin Urine: NEGATIVE
Glucose, UA: >=500 mg/dL — AB
Ketones, ur: 5 mg/dL — AB
Leukocytes,Ua: NEGATIVE
Nitrite: NEGATIVE
Protein, ur: NEGATIVE mg/dL
Specific Gravity, Urine: 1.024 (ref 1.005–1.030)
pH: 5 (ref 5.0–8.0)

## 2020-12-14 LAB — CBC
HCT: 43.1 % (ref 39.0–52.0)
Hemoglobin: 14.8 g/dL (ref 13.0–17.0)
MCH: 30.7 pg (ref 26.0–34.0)
MCHC: 34.3 g/dL (ref 30.0–36.0)
MCV: 89.4 fL (ref 80.0–100.0)
Platelets: 174 10*3/uL (ref 150–400)
RBC: 4.82 MIL/uL (ref 4.22–5.81)
RDW: 13 % (ref 11.5–15.5)
WBC: 10.2 10*3/uL (ref 4.0–10.5)
nRBC: 0 % (ref 0.0–0.2)

## 2020-12-14 LAB — T4, FREE: Free T4: 1.41 ng/dL — ABNORMAL HIGH (ref 0.61–1.12)

## 2020-12-14 LAB — BASIC METABOLIC PANEL
Anion gap: 9 (ref 5–15)
BUN: 26 mg/dL — ABNORMAL HIGH (ref 8–23)
CO2: 26 mmol/L (ref 22–32)
Calcium: 8.4 mg/dL — ABNORMAL LOW (ref 8.9–10.3)
Chloride: 104 mmol/L (ref 98–111)
Creatinine, Ser: 1.58 mg/dL — ABNORMAL HIGH (ref 0.61–1.24)
GFR, Estimated: 43 mL/min — ABNORMAL LOW (ref >60)
Glucose, Bld: 479 mg/dL — ABNORMAL HIGH (ref 70–99)
Potassium: 4 mmol/L (ref 3.5–5.1)
Sodium: 139 mmol/L (ref 135–145)

## 2020-12-14 LAB — CBG MONITORING, ED
Glucose-Capillary: >600 mg/dL (ref 70–99)
Glucose-Capillary: 516 mg/dL (ref 70–99)
Glucose-Capillary: 579 mg/dL (ref 70–99)
Glucose-Capillary: 357 mg/dL — ABNORMAL HIGH (ref 70–99)
Glucose-Capillary: 375 mg/dL — ABNORMAL HIGH (ref 70–99)
Glucose-Capillary: 152 mg/dL — ABNORMAL HIGH (ref 70–99)

## 2020-12-14 LAB — TSH: TSH: 3.097 u[IU]/mL (ref 0.350–4.500)

## 2020-12-14 LAB — TROPONIN I (HIGH SENSITIVITY)
Troponin I (High Sensitivity): 3036 ng/L (ref <18)
Troponin I (High Sensitivity): 3039 ng/L (ref <18)
Troponin I (High Sensitivity): 2769 ng/L (ref <18)

## 2020-12-14 LAB — MAGNESIUM: Magnesium: 1.9 mg/dL (ref 1.7–2.4)

## 2020-12-14 LAB — PHOSPHORUS: Phosphorus: 3.2 mg/dL (ref 2.5–4.6)

## 2020-12-14 MED ORDER — DILTIAZEM HCL 60 MG PO TABS
60.0000 mg | ORAL_TABLET | Freq: Four times a day (QID) | ORAL | Status: DC
  Administered 2020-12-14 – 2020-12-15 (×3): 60 mg via ORAL
  Filled 2020-12-14 (×3): qty 1

## 2020-12-14 MED ORDER — DILTIAZEM HCL 60 MG PO TABS: 30.0000 mg | ORAL_TABLET | Freq: Four times a day (QID) | ORAL | Status: DC

## 2020-12-14 MED ORDER — INSULIN REGULAR HUMAN 100 UNIT/ML IJ SOLN
10.0000 [IU] | Freq: Once | INTRAMUSCULAR | Status: AC
  Administered 2020-12-14: 10 [IU] via INTRAVENOUS
  Filled 2020-12-14: qty 3

## 2020-12-14 MED ORDER — INSULIN ASPART 100 UNIT/ML IJ SOLN
10.0000 [IU] | Freq: Once | INTRAMUSCULAR | Status: AC
  Administered 2020-12-14: 10 [IU] via INTRAVENOUS
  Filled 2020-12-14: qty 1

## 2020-12-14 MED ORDER — PERFLUTREN LIPID MICROSPHERE
1.0000 mL | INTRAVENOUS | Status: AC | PRN
  Administered 2020-12-14: 3 mL via INTRAVENOUS
  Filled 2020-12-14: qty 10

## 2020-12-14 MED ORDER — INSULIN DETEMIR 100 UNIT/ML ~~LOC~~ SOLN
10.0000 [IU] | Freq: Once | SUBCUTANEOUS | Status: AC
  Administered 2020-12-14: 10 [IU] via SUBCUTANEOUS
  Filled 2020-12-14: qty 0.1

## 2020-12-14 MED ORDER — METOPROLOL SUCCINATE ER 50 MG PO TB24
50.0000 mg | ORAL_TABLET | Freq: Every day | ORAL | Status: DC
  Administered 2020-12-15: 50 mg via ORAL
  Filled 2020-12-14: qty 1

## 2020-12-14 NOTE — Progress Notes (Signed)
[facility] Progress Note  Patient: [name]    [id number]  DOA: 12/13/2020     Date of Service: the patient was seen and examined on 12/14/2020  Chief Complaint  Patient presents with   Atrial Fibrillation    Afib w/ RVR   Brief hospital course:  Assessment and Plan: Principal Problem:   Atrial fibrillation with rapid ventricular response ([place]) Active Problems:   AKI (acute kidney injury) ([place])   Hyperglycemia due to type 2 diabetes mellitus ([place])   PAD (peripheral artery disease) (HCC)   S/P bilateral BKA (below knee amputation) (HCC)   CAD (coronary artery disease)   Primary hypertension   Chronic diastolic heart failure (HCC)   Seizure disorder (HCC)   GERD (gastroesophageal reflux disease)   A. fib RVR: Converted back to normal sinus rhythm We will continue patient on aspirin 81,  Increase Toprol-XL 50 mg p.o. daily as per cardiology . Patient did receive diltiazem 15 mg in the emergency room and  s/p diltiazem drip and transition to Cardizem immediate release 30 mg p.o. every 6 hourly which was increased by cardiology to 60 mg p.o. every 6 hourly.  At home patient takesTiadylt 120 mg  Cardiology consult appreciated.   NSTEMI vs demand ischemia secondary to A. fib with RVR Patient had chest pain which is resolved now currently patient is asymptomatic Troponin 3039 --2769 trended down Cardiology recommended continue aspirin, no need of heparin IV infusion continue Xarelto No need of cardiac cath at this time, continue medical management Follow cardiology for further recommendation    Acute kidney injury: Most likely prerenal in the setting of hyperglycemia hold patient's chlorthalidone, Imdur. We will Continue with IV hydration, replace and correct electrolytes. Attribute to prerenal etiology, dehydration secondary to hyperglycemia. Renally dose all needed meds and avoid contrast studies. Creatinine 2.02 -->1.58 gradually  improving   Hyperglycemia due to uncontrolled diabetes mellitus type 2: Patient's A1c in June was 9.6, I currently held patient's home regimen, patient is started on long-acting insulin overnight, with continued monitoring of sliding scale every 6 hours. IV insulin 10 units given in the morning to control blood glucose, Levemir 10 units also given in the morning.  Continue Levemir 15 units nightly   CAD/PAD status post bilateral BKA: We will continue patient on aspirin,statin, Xarelto. Fall precautions aspiration precautions.   Primary hypertension: Home regimen of chlorthalidone held due to acute kidney injury,  continue patient on metoprolol and  S/p diltiazem drip, transition to Cardizem 60 mg p.o. every 6 hourly as per cardiology.   Monitor BP and titrate medications accordingly   Chronic systolic CHF, LVEF 45 to [id number] , stable, no signs of volume overload Stable, continue aspirin and statin and use as needed diuretic therapy as needed only. Resume diuretics on discharge if renal function stable.   Seizure disorder: Seizure precautions, will continue patient on his home regimen of Depakote.   GERD: IV PPI therapy.  Abnormal thyroid function test, elevated free T4 1.41, normal TSH level Repeat thyroid function after 4 weeks and follow with PCP.   Body mass index is 29.05 kg/m.  Interventions:       Diet: Diabetic and heart healthy DVT Prophylaxis: Therapeutic Anticoagulation with Xarelto    Advance goals of care discussion: Full code  Family Communication: family was NOT present at bedside, at the time of interview.  The pt provided permission to discuss medical plan with the family. Opportunity was given to ask question and all questions were answered satisfactorily.  Disposition:  Pt is from Home, admitted with A. Fib w/rvr, chest pain, AKI, and hyperglycemia still has AKI and hyperglycemia, which precludes a safe discharge. Discharge to home, when medically  stable, most likely in 1 to 2 days.  Subjective: No overnight issues, patient currently chest pain-free, no shortness of breath, no palpitations, denied any active issues.  Physical Exam: General:  alert oriented to time, place, and person.  Appear in mild distress, affect appropriate Eyes: PERRLA ENT: Oral Mucosa Clear, moist  Neck: no JVD,  Cardiovascular: S1 and S2 Present, no Murmur,  Respiratory: good respiratory effort, Bilateral Air entry equal and Decreased, no Crackles, mno wheezes Abdomen: Bowel Sound present, Soft and no tenderness,  Skin: no rashes Extremities: s/p B/L BKA, has prosthesis and uses wheelchair as well. Neurologic: without any new focal findings Gait not checked due to patient safety concerns  Vitals:   12/14/20 0930 12/14/20 1122 12/14/20 1200 12/14/20 1427  BP: (!) 169/96  (!) 119/58 (!) 168/82  Pulse: 74 65 66 66  Resp: (!) '21 18 16 20  '$ Temp:      TempSrc:      SpO2: 100% 100% 99% 98%  Weight:      Height:        Intake/Output Summary (Last 24 hours) at 12/14/2020 1459 Last data filed at 12/14/2020 1104 Gross per 24 hour  Intake 980 ml  Output 800 ml  Net 180 ml   Filed Weights   12/13/20 2139  Weight: 84.1 kg    Data Reviewed: I have personally reviewed and interpreted daily labs, tele strips, imagings as discussed above. I reviewed all nursing notes, pharmacy notes, vitals, pertinent old records I have discussed plan of care as described above with RN and patient/family.  CBC: Recent Labs  Lab 12/13/20 2133 12/14/20 0938  WBC 11.9* 10.2  NEUTROABS 8.7*  --   HGB 16.2 14.8  HCT 48.1 43.1  MCV 89.2 89.4  PLT 232 [id number]   Basic Metabolic Panel: Recent Labs  Lab 12/13/20 2133 12/14/20 0938  NA 131* 139  K 4.1 4.0  CL 94* 104  CO2 22 26  GLUCOSE 751* 479*  BUN 34* 26*  CREATININE 2.02* 1.58*  CALCIUM 9.2 8.4*  MG  --  1.9  PHOS  --  3.2    Studies: DG Chest Portable 1 View  Result Date: 12/13/2020 CLINICAL DATA:  Pt  was out with family and started "feeling funny" and experienced chest palpitations. Per EMS HR ranged between 120-160s. Hx of afib - states hes semi-compliant with his Cardizem medication. Denies CP or SOB. EXAM: PORTABLE CHEST 1 VIEW COMPARISON:  11/09/2020 and older exams. FINDINGS: Cardiac silhouette is mildly enlarged. No mediastinal or hilar masses. Clear lungs.  No convincing pleural effusion.  No pneumothorax. Skeletal structures are grossly intact. IMPRESSION: No acute cardiopulmonary disease. Electronically Signed   By: Lajean Manes M.D.   On: 12/13/2020 23:00   ECHOCARDIOGRAM COMPLETE  Result Date: 12/14/2020    ECHOCARDIOGRAM REPORT   Patient Name:   [name] Date of Exam: 12/14/2020 Medical Rec #:  [id number]             Height:       67.0 in Accession #:    [id number]            Weight:       185.5 lb Date of Birth:  1934-07-22            BSA:  1.959 m Patient Age:    86 years              BP:           165/89 mmHg Patient Gender: M                     HR:           72 bpm. Exam Location:  [facility] Procedure: 2D Echo and Intracardiac Opacification Agent Indications:     Atrial Fibrillation  History:         Patient has prior history of Echocardiogram examinations. CHF,                  Arrythmias:Atrial Fibrillation; Risk Factors:CKD and                  Dyslipidemia.  Sonographer:     L Thornton-Maynard Referring Phys:  [id number] PATEL Diagnosing Phys: Isaias Cowman MD  Sonographer Comments: Suboptimal apical window. IMPRESSIONS  1. Left ventricular ejection fraction, by estimation, is 35 to 40%. The left ventricle has moderately decreased function. The left ventricle demonstrates regional wall motion abnormalities (see scoring diagram/findings for description). Left ventricular  diastolic parameters were normal.  2. Right ventricular systolic function is normal. The right ventricular size is normal.  3. The mitral valve is normal in structure. Mild mitral valve  regurgitation. No evidence of mitral stenosis.  4. The aortic valve is normal in structure. Aortic valve regurgitation is not visualized. No aortic stenosis is present.  5. The inferior vena cava is normal in size with greater than 50% respiratory variability, suggesting right atrial pressure of 3 mmHg. FINDINGS  Left Ventricle: Left ventricular ejection fraction, by estimation, is 35 to 40%. The left ventricle has moderately decreased function. The left ventricle demonstrates regional wall motion abnormalities. Definity contrast agent was given IV to delineate the left ventricular endocardial borders. The left ventricular internal cavity size was normal in size. There is no left ventricular hypertrophy. Left ventricular diastolic parameters were normal.  LV Wall Scoring: The apical lateral segment, apical septal segment, and apex are hypokinetic. Right Ventricle: The right ventricular size is normal. No increase in right ventricular wall thickness. Right ventricular systolic function is normal. Left Atrium: Left atrial size was normal in size. Right Atrium: Right atrial size was normal in size. Pericardium: There is no evidence of pericardial effusion. Mitral Valve: The mitral valve is normal in structure. Mild mitral valve regurgitation. No evidence of mitral valve stenosis. MV peak gradient, 2.8 mmHg. The mean mitral valve gradient is 1.0 mmHg. Tricuspid Valve: The tricuspid valve is normal in structure. Tricuspid valve regurgitation is mild . No evidence of tricuspid stenosis. Aortic Valve: The aortic valve is normal in structure. Aortic valve regurgitation is not visualized. No aortic stenosis is present. Aortic valve mean gradient measures 2.0 mmHg. Aortic valve peak gradient measures 3.9 mmHg. Aortic valve area, by VTI measures 2.25 cm. Pulmonic Valve: The pulmonic valve was normal in structure. Pulmonic valve regurgitation is not visualized. No evidence of pulmonic stenosis. Aorta: The aortic root is normal  in size and structure. Venous: The inferior vena cava is normal in size with greater than 50% respiratory variability, suggesting right atrial pressure of 3 mmHg. IAS/Shunts: No atrial level shunt detected by color flow Doppler.  LEFT VENTRICLE PLAX 2D LVIDd:         5.44 cm      Diastology LVIDs:  4.50 cm      LV e' medial:    2.51 cm/s LV PW:         1.10 cm      LV E/e' medial:  19.6 LV IVS:        1.63 cm      LV e' lateral:   2.36 cm/s LVOT diam:     2.10 cm      LV E/e' lateral: 20.9 LV SV:         49 LV SV Index:   25 LVOT Area:     3.46 cm  LV Volumes (MOD) LV vol d, MOD A2C: 107.0 ml LV vol d, MOD A4C: 88.3 ml LV vol s, MOD A2C: 58.9 ml LV vol s, MOD A4C: 54.5 ml LV SV MOD A2C:     48.1 ml LV SV MOD A4C:     88.3 ml LV SV MOD BP:      41.9 ml RIGHT VENTRICLE RV S prime:     11.90 cm/s TAPSE (M-mode): 1.5 cm LEFT ATRIUM             Index LA diam:        4.10 cm 2.09 cm/m LA Vol (A2C):   53.4 ml 27.26 ml/m LA Vol (A4C):   51.8 ml 26.45 ml/m LA Biplane Vol: 52.9 ml 27.01 ml/m  AORTIC VALVE AV Area (Vmax):    1.96 cm AV Area (Vmean):   1.75 cm AV Area (VTI):     2.25 cm AV Vmax:           99.10 cm/s AV Vmean:          71.500 cm/s AV VTI:            0.217 m AV Peak Grad:      3.9 mmHg AV Mean Grad:      2.0 mmHg LVOT Vmax:         56.10 cm/s LVOT Vmean:        36.100 cm/s LVOT VTI:          0.141 m LVOT/AV VTI ratio: 0.65  AORTA Ao Root diam: 3.40 cm MITRAL VALVE MV Area (PHT): 3.19 cm    SHUNTS MV Area VTI:   2.44 cm    Systemic VTI:  0.14 m MV Peak grad:  2.8 mmHg    Systemic Diam: 2.10 cm MV Mean grad:  1.0 mmHg MV Vmax:       0.84 m/s MV Vmean:      52.3 cm/s MV E velocity: 49.30 cm/s MV A velocity: 76.70 cm/s MV E/A ratio:  0.64 Isaias Cowman MD Electronically signed by Isaias Cowman MD Signature Date/Time: 12/14/2020/1:55:11 PM    Final     Scheduled Meds:  aspirin EC  81 mg Oral Daily   atorvastatin  80 mg Oral QHS   diltiazem  60 mg Oral Q6H   divalproex  500 mg Oral  Daily   insulin aspart  0-15 Units Subcutaneous TID WC   insulin detemir  15 Units Subcutaneous QHS   [START ON 12/15/2020] metoprolol succinate  50 mg Oral Daily   Rivaroxaban  15 mg Oral Q supper   Continuous Infusions: PRN Meds: acetaminophen **OR** acetaminophen, nitroGLYCERIN  Time spent: 35 minutes  Author: Val Riles. MD [facility] 12/14/2020 2:59 PM  To reach On-call, see care teams to locate the attending and reach out to them via [email or web address]. If 7PM-7AM, please contact night-coverage If you still have difficulty reaching the attending  provider, please page the [facility] (Director on Call) for [facility] on amion for assistance.

## 2020-12-14 NOTE — ED Notes (Signed)
Dr Damita Dunnings informed of the patient elevated blood sugar, MD stated that she would review and place an insulin ordered for the patient

## 2020-12-14 NOTE — ED Notes (Signed)
Pt given Tv remote and is resting comfortably at this time. Call light in reach. Fall precautions in place.

## 2020-12-14 NOTE — ED Notes (Signed)
Blood sugar prior to sq insulin read hi

## 2020-12-14 NOTE — ED Notes (Signed)
Finger stick blood sugar was 516, MD will be informed when he returns to his desk

## 2020-12-14 NOTE — Consult Note (Addendum)
[facility] Cardiology  CARDIOLOGY CONSULT NOTE  Patient ID: [name] MRN: [id number] DOB/AGE: [birth year] 86 y.o.  Admit date: 12/13/2020 Referring Physician Dwyane Dee Primary Physician [name] NP Primary Cardiologist Nehemiah Massed Reason for Consultation atrial fibrillation with rapid ventricular rate  HPI: 86 year old gentleman referred for evaluation of atrial fibrillation with rapid ventricular rate and elevated troponin / NSTEMI.  The patient presents to [facility] ED 12/13/2020 with chief complaint of palpitations and heart racing with associated chest discomfort.  In the emergency room, patient noted to be tachycardic, ECG revealed atrial fibrillation with rapid ventricular rate of 154 bpm.  Patient treated with diltiazem bolus and drip with eventual conversion to sinus rhythm.  Patient currently denies chest pain.  Initial troponin was 161.  Follow-up troponin 3036.  Patient has known coronary artery disease.  Cardiac catheterization 06/25/2019 in the setting of unstable angina revealed occluded ostial LAD, 50% stenosis ostium RCA, with LVEF 35 to 40% which appeared unchanged compared to prior cardiac catheterization 06/05/2018.  Admission labs also notable for BUN and creatinine of 34 and 2.02, respectively.  Review of systems complete and found to be negative unless listed above     Past Medical History:  Diagnosis Date   Arthritis    Atrial fibrillation ([place])    Carcinoma of prostate ([place])    CHF (congestive heart failure) ([place])    Chronic kidney disease    Diabetes mellitus without complication [facility])    ED (erectile dysfunction)    Frequent urination    Hyperlipidemia    Hypertension    Moderate mitral insufficiency    Peripheral vascular disease ([place])    Pneumonia 04/2016   Prostate cancer ([place])    Sleep apnea    OSA--USE C-PAP   Ulcer of left foot due to type 2 diabetes mellitus ([place])    Urinary stress incontinence, male     Past Surgical History:  Procedure Laterality  Date   AMPUTATION Left 01/12/2017   Procedure: AMPUTATION BELOW KNEE;  Surgeon: Algernon Huxley, MD;  Location: [place] [facility];  Service: General;  Laterality: Left;   AMPUTATION Right 12/27/2018   Procedure: AMPUTATION BELOW KNEE;  Surgeon: Algernon Huxley, MD;  Location: [facility];  Service: General;  Laterality: Right;   APLIGRAFT PLACEMENT Left 10/15/2016   Procedure: APLIGRAFT PLACEMENT;  Surgeon: Albertine Patricia, DPM;  Location: [facility];  Service: Podiatry;  Laterality: Left;  necrotic ulcer   CHOLECYSTECTOMY     COLONOSCOPY WITH PROPOFOL N/A 01/02/2019   Procedure: COLONOSCOPY WITH PROPOFOL;  Surgeon: Lucilla Lame, MD;  Location: [facility] ENDOSCOPY;  Service: Endoscopy;  Laterality: N/A;   COLONOSCOPY WITH PROPOFOL N/A 01/05/2019   Procedure: COLONOSCOPY WITH PROPOFOL;  Surgeon: Lucilla Lame, MD;  Location: [facility] ENDOSCOPY;  Service: Endoscopy;  Laterality: N/A;   ESOPHAGOGASTRODUODENOSCOPY (EGD) WITH PROPOFOL N/A 01/02/2019   Procedure: ESOPHAGOGASTRODUODENOSCOPY (EGD) WITH PROPOFOL;  Surgeon: Lucilla Lame, MD;  Location: [facility] ENDOSCOPY;  Service: Endoscopy;  Laterality: N/A;   EYE SURGERY Bilateral    Cataract Extraction with IOL   HIP ARTHROPLASTY Right 10/13/2018   Procedure: ARTHROPLASTY BIPOLAR HIP (HEMIARTHROPLASTY);  Surgeon: Earnestine Leys, MD;  Location: [facility];  Service: Orthopedics;  Laterality: Right;   IRRIGATION AND DEBRIDEMENT FOOT Left 10/15/2016   Procedure: IRRIGATION AND DEBRIDEMENT FOOT-EXCISIONAL DEBRIDEMENT OF SKIN 3RD, 4TH AND 5TH TOES WITH APPLICATION OF APLIGRAFT;  Surgeon: Albertine Patricia, DPM;  Location: [facility];  Service: Podiatry;  Laterality: Left;  necrotic,gangrene   IRRIGATION AND DEBRIDEMENT FOOT Left 12/09/2016   Procedure: IRRIGATION AND  DEBRIDEMENT FOOT-MUSCLE/FASCIA, RESECTION OF FOURTH AND FIFTH METATARSAL NECROTIC BONE;  Surgeon: Albertine Patricia, DPM;  Location: [facility];  Service: Podiatry;  Laterality: Left;   IRRIGATION AND DEBRIDEMENT FOOT Right 12/23/2018    Procedure: IRRIGATION AND DEBRIDEMENT FOOT and wound vac application;  Surgeon: Samara Deist, DPM;  Location: [facility];  Service: Podiatry;  Laterality: Right;   LEFT HEART CATH N/A 06/25/2019   Procedure: Left Heart Cath and Coronary Angiography;  Surgeon: Isaias Cowman, MD;  Location: [place] CV LAB;  Service: Cardiovascular;  Laterality: N/A;   LEFT HEART CATH AND CORONARY ANGIOGRAPHY N/A 06/05/2018   Procedure: LEFT HEART CATH AND CORONARY ANGIOGRAPHY;  Surgeon: Yolonda Kida, MD;  Location: [place] CV [facility];  Service: Cardiovascular;  Laterality: N/A;   LOWER EXTREMITY ANGIOGRAPHY Left 08/16/2016   Procedure: Lower Extremity Angiography;  Surgeon: Algernon Huxley, MD;  Location: [place] [facility];  Service: Cardiovascular;  Laterality: Left;   LOWER EXTREMITY ANGIOGRAPHY Left 09/01/2016   Procedure: Lower Extremity Angiography;  Surgeon: Algernon Huxley, MD;  Location: [place] [facility];  Service: Cardiovascular;  Laterality: Left;   LOWER EXTREMITY ANGIOGRAPHY Left 10/14/2016   Procedure: Lower Extremity Angiography;  Surgeon: Algernon Huxley, MD;  Location: [place] [facility];  Service: Cardiovascular;  Laterality: Left;   LOWER EXTREMITY ANGIOGRAPHY Left 12/20/2016   Procedure: Lower Extremity Angiography;  Surgeon: Algernon Huxley, MD;  Location: [place] [facility];  Service: Cardiovascular;  Laterality: Left;   LOWER EXTREMITY ANGIOGRAPHY Right 12/18/2018   Procedure: LOWER EXTREMITY ANGIOGRAPHY;  Surgeon: Algernon Huxley, MD;  Location: [place] [facility];  Service: Cardiovascular;  Laterality: Right;   LOWER EXTREMITY INTERVENTION  09/01/2016   Procedure: Lower Extremity Intervention;  Surgeon: Algernon Huxley, MD;  Location: [place] [facility];  Service: Cardiovascular;;   PROSTATE SURGERY     removal   TONSILLECTOMY     as a child    (Not in a hospital admission)  Social History   Socioeconomic History   Marital status: Married    Spouse name: Not on file   Number  of children: Not on file   Years of education: Not on file   Highest education level: Not on file  Occupational History   Occupation: retired  Tobacco Use   Smoking status: Former    Packs/day: 0.25    Types: Cigarettes    Quit date: 10/14/1994    Years since quitting: 26.1   Smokeless tobacco: Never  Vaping Use   Vaping Use: Never used  Substance and Sexual Activity   Alcohol use: No    Alcohol/week: 0.0 standard drinks   Drug use: No   Sexual activity: Not on file  Other Topics Concern   Not on file  Social History Narrative   Lives at home with wife, has a wheelchair   Social Determinants of Health   Financial Resource Strain: Not on file  Food Insecurity: Not on file  Transportation Needs: Not on file  Physical Activity: Not on file  Stress: Not on file  Social Connections: Not on file  Intimate Partner Violence: Not on file    Family History  Problem Relation Age of Onset   Hypertension Mother    Diabetes Mother    Prostate cancer Neg Hx    Bladder Cancer Neg Hx    Kidney disease Neg Hx       Review of systems complete and found to be negative unless listed above  PHYSICAL EXAM  General: Well developed, well nourished, in no acute distress HEENT:  Normocephalic and atramatic Neck:  No JVD.  Lungs: Clear bilaterally to auscultation and percussion. Heart: HRRR . Normal S1 and S2 without gallops or murmurs.  Abdomen: Bowel sounds are positive, abdomen soft and non-tender  Msk:  Back normal, normal gait. Normal strength and tone for age. Extremities: No clubbing, cyanosis or edema.   Neuro: Alert and oriented X 3. Psych:  Good affect, responds appropriately  Labs:   Lab Results  Component Value Date   WBC 11.9 (H) 12/13/2020   HGB 16.2 12/13/2020   HCT 48.1 12/13/2020   MCV 89.2 12/13/2020   PLT 232 12/13/2020    Recent Labs  Lab 12/13/20 2133  NA 131*  K 4.1  CL 94*  CO2 22  BUN 34*  CREATININE 2.02*  CALCIUM 9.2  GLUCOSE 751*    Lab Results  Component Value Date   CKTOTAL 131 10/22/2016   CKMB 1.2 01/19/2013   TROPONINI 0.26 (HH) 06/07/2018    Lab Results  Component Value Date   CHOL 154 04/08/2020   CHOL 253 (H) 11/30/2019   CHOL 121 06/25/2019   Lab Results  Component Value Date   HDL 39 (L) 04/08/2020   HDL 34 (L) 06/25/2019   HDL 30 (L) 06/06/2018   Lab Results  Component Value Date   LDLCALC 101 (H) 04/08/2020   LDLCALC 76 06/25/2019   LDLCALC 95 06/06/2018   Lab Results  Component Value Date   TRIG 72 04/08/2020   TRIG 140 12/06/2019   TRIG 147 12/03/2019   Lab Results  Component Value Date   CHOLHDL 3.9 04/08/2020   CHOLHDL 3.6 06/25/2019   CHOLHDL 4.9 06/06/2018   No results found for: LDLDIRECT    Radiology: DG Chest Portable 1 View  Result Date: 12/13/2020 CLINICAL DATA:  Pt was out with family and started "feeling funny" and experienced chest palpitations. Per EMS HR ranged between 120-160s. Hx of afib - states hes semi-compliant with his Cardizem medication. Denies CP or SOB. EXAM: PORTABLE CHEST 1 VIEW COMPARISON:  11/09/2020 and older exams. FINDINGS: Cardiac silhouette is mildly enlarged. No mediastinal or hilar masses. Clear lungs.  No convincing pleural effusion.  No pneumothorax. Skeletal structures are grossly intact. IMPRESSION: No acute cardiopulmonary disease. Electronically Signed   By: Lajean Manes M.D.   On: 12/13/2020 23:00    EKG: Atrial fibrillation at 154 bpm  ASSESSMENT AND PLAN:   1.  Atrial fibrillation with rapid ventricular rate, converted to sinus rhythm with diltiazem bolus and drip, on Xarelto for stroke prevention 2.  Non-ST elevation myocardial infarction versus type II MI due to demand supply ischemia, with high sensitivity troponin 3036, in the setting of atrial fibrillation with rapid ventricular rate, currently chest pain-free 3.  Coronary artery disease, with occluded LAD, 50% stenosis ostial RCA, marked cardiac catheterization 06/25/2019 which  appears unchanged compared to prior cardiac catheterization from 06/05/2018 4.  Chronic kidney disease with BUN and creatinine 34 and 2.02, respectively  Recommendations  1.  Agree with current therapy 2.  Defer heparin infusion for now 3.  Continue Xarelto for stroke prevention 4.  Uptitrate metoprolol succinate to 50 mg daily 5.  Uptitrate diltiazem to 60 mg p.o. every 6 hours 6.  DC diltiazem drip 7.  Defer repeat cardiac catheterization at this time.  Patient had recent cardiac catheterization 06/25/2019 which appeared unchanged to prior cardiac catheterization from 06/05/2018.  Patient has significant chronic kidney  disease and would be at high risk for contrast-induced nephrotoxicity.  Patient currently is chest pain-free.  Signed: Isaias Cowman MD,PhD, [facility] 12/14/2020, 9:38 AM

## 2020-12-14 NOTE — ED Notes (Signed)
Pt yelling "HELP!" into the hallway. This RN and Scientist, product/process development to bedside, pt pad is soiled but primofit still in place. Pad changed. Pt is alert but oriented to only self. Bed alarm on pt at this time.

## 2020-12-14 NOTE — ED Notes (Signed)
Patients bed linen changed and peri care preformed. Pt repositioned in bed. Patient resting comfortably. Call light in reach. Fall precautions in place.

## 2020-12-14 NOTE — ED Notes (Signed)
Patient inc of urine and stool, patient cleaned, linen changed

## 2020-12-15 DIAGNOSIS — I4891 Unspecified atrial fibrillation: Principal | ICD-10-CM

## 2020-12-15 LAB — CBC
HCT: 40.7 % (ref 39.0–52.0)
Hemoglobin: 14.2 g/dL (ref 13.0–17.0)
MCH: 30.4 pg (ref 26.0–34.0)
MCHC: 34.9 g/dL (ref 30.0–36.0)
MCV: 87.2 fL (ref 80.0–100.0)
Platelets: 202 10*3/uL (ref 150–400)
RBC: 4.67 MIL/uL (ref 4.22–5.81)
RDW: 13.2 % (ref 11.5–15.5)
WBC: 11.4 10*3/uL — ABNORMAL HIGH (ref 4.0–10.5)
nRBC: 0 % (ref 0.0–0.2)

## 2020-12-15 LAB — BASIC METABOLIC PANEL
Anion gap: 8 (ref 5–15)
BUN: 25 mg/dL — ABNORMAL HIGH (ref 8–23)
CO2: 28 mmol/L (ref 22–32)
Calcium: 8.2 mg/dL — ABNORMAL LOW (ref 8.9–10.3)
Chloride: 101 mmol/L (ref 98–111)
Creatinine, Ser: 1.38 mg/dL — ABNORMAL HIGH (ref 0.61–1.24)
GFR, Estimated: 51 mL/min — ABNORMAL LOW (ref >60)
Glucose, Bld: 173 mg/dL — ABNORMAL HIGH (ref 70–99)
Potassium: 3.3 mmol/L — ABNORMAL LOW (ref 3.5–5.1)
Sodium: 137 mmol/L (ref 135–145)

## 2020-12-15 LAB — PHOSPHORUS: Phosphorus: 2.9 mg/dL (ref 2.5–4.6)

## 2020-12-15 LAB — MAGNESIUM: Magnesium: 1.6 mg/dL — ABNORMAL LOW (ref 1.7–2.4)

## 2020-12-15 LAB — CBG MONITORING, ED: Glucose-Capillary: 138 mg/dL — ABNORMAL HIGH (ref 70–99)

## 2020-12-15 MED ORDER — DILTIAZEM HCL ER COATED BEADS 240 MG PO CP24: 240.0000 mg | ORAL_CAPSULE | Freq: Every day | ORAL | 0 refills | Status: DC

## 2020-12-15 MED ORDER — POTASSIUM CHLORIDE CRYS ER 20 MEQ PO TBCR
40.0000 meq | EXTENDED_RELEASE_TABLET | Freq: Once | ORAL | Status: AC
  Administered 2020-12-15: 40 meq via ORAL
  Filled 2020-12-15: qty 2

## 2020-12-15 MED ORDER — MAGNESIUM SULFATE 2 GM/50ML IV SOLN
2.0000 g | Freq: Once | INTRAVENOUS | Status: AC
  Administered 2020-12-15: 2 g via INTRAVENOUS
  Filled 2020-12-15: qty 50

## 2020-12-15 MED ORDER — METOPROLOL SUCCINATE ER 50 MG PO TB24: 50.0000 mg | ORAL_TABLET | Freq: Every day | ORAL | 0 refills | Status: DC

## 2020-12-15 MED ORDER — DILTIAZEM HCL ER COATED BEADS 240 MG PO CP24
240.0000 mg | ORAL_CAPSULE | Freq: Every day | ORAL | Status: DC
  Administered 2020-12-15: 240 mg via ORAL
  Filled 2020-12-15: qty 1

## 2020-12-15 NOTE — ED Notes (Signed)
Pt provided a full linen change, brief, and gown change. Pericare performed. Pt repositioned and provided a warm blanket.

## 2020-12-15 NOTE — ED Notes (Signed)
Pt assisted in getting dressed and into W/C. Per pt, family will be here soon. Pt to wait in room until family arrives. AVS given to and discussed with pt. Pt verbalizes understanding and denies further questions at this time.

## 2020-12-15 NOTE — ED Notes (Signed)
Pt spouse called to arrange transportation home. Per spouse, daughter in law to come pick up pt. Pt spouse to let this RN know what time to have pt ready.

## 2020-12-15 NOTE — Discharge Summary (Signed)
Physician Discharge Summary  [name] [id number] DOB: 1935/01/03 DOA: 12/13/2020  PCP: Ezequiel Kayser, MD  Admit date: 12/13/2020 Discharge date: 12/15/2020  Admitted From: Home Disposition: Home  Recommendations for Outpatient Follow-up:  Follow up with PCP in 1-2 weeks Follow-up with cardiology as directed  Home Health: No Equipment/Devices: None  Discharge Condition: Stable CODE STATUS: full Diet recommendation: Heart healthy  Brief/Interim Summary:  86 y.o. male seen in ed with complaints of palpitations and chest pain patient is not able to give a detailed history and therefore HPI is limited as patient is a poor historian.  Patient does state that he was eating lunch when he started to have heart pain that he took a nitro and that pain is gone now.  He patient lives with wife at home has 2 children.     Patient's last admission was 11/09/2020 when he was admitted for unstable angina and evaluated by cardiology Dr. Nehemiah Massed with plan for medication management for patient's CAD including metoprolol and Imdur for chronic stable angina without evidence of MI or ACS.  Patient during that visit also had a ventilation/perfusion scan to identify PE which was negative for any findings indicative of pulmonary embolism.  Patient was discharged on 11/10/2020.   Pt has past medical history of coronary artery disease, hypertension, hyperlipidemia, severe peripheral arterial disease status post bilateral amputations, paroxysmal nonvalvular atrial fibrillation, patient's last cardiac cath was in 2021 showing 100% ostial LAD stenosis 30% ostial circumflex throughout the artery and 50% RCA stenosis with overall ejection fraction of 35 to 40%.  Cardiology was reengaged for recommendations.  Titrated metoprolol and diltiazem for better control of rate in the setting of atrial fibrillation.  Given recent cardiac catheterization repeat cath was deferred at this time.-Infusion was deferred.   Patient was continued on home Xarelto.  On day of discharge patient was seen by cardiology who cleared for discharge.  Follow-up in office in 1 to 2 weeks.  Updated medications reflected on medication reconciliation.  Discharged home in stable condition.   Discharge Diagnoses:  Principal Problem:   Atrial fibrillation with rapid ventricular response (HCC) Active Problems:   Primary hypertension   PAD (peripheral artery disease) (HCC)   AKI (acute kidney injury) ([place])   Chronic diastolic heart failure (HCC)   S/P bilateral BKA (below knee amputation) (HCC)   Seizure disorder (HCC)   CAD (coronary artery disease)   Hyperglycemia due to type 2 diabetes mellitus (HCC)   GERD (gastroesophageal reflux disease)  A. fib RVR: Converted back to normal sinus rhythm Controlled after increased dose of metoprolol and diltiazem.  On day of discharge these increased doses reflected on nonmedication continuation     NSTEMI vs demand ischemia secondary to A. fib with RVR Patient had chest pain which is resolved now currently patient is asymptomatic Troponin 3039 --2769 trended down Cardiology recommended continue aspirin, no need of heparin IV infusion continue Xarelto No need of cardiac cath at this time, continue medical management Outpatient follow-up cardiology     Acute kidney injury: Most likely prerenal in the setting of hyperglycemia hold patient's chlorthalidone, Imdur. Chlorthalidone is held on discharge to allow more room for increased doses of metoprolol and diltiazem.  Imdur was resumed.  Follow-up outpatient PCP     Hyperglycemia due to uncontrolled diabetes mellitus type 2: Can resume home regimen at time of discharge.  Follow-up outpatient PCP   CAD/PAD status post bilateral BKA: We will continue patient on aspirin,statin, Xarelto. Fall precautions aspiration precautions.  Primary hypertension: Chlorthalidone held and discontinued at time of discharge.  Blood pressure  controlled on Cardizem and metoprolol patient to remove     Chronic systolic CHF, LVEF 45 to [id number] , stable, no signs of volume overload Stable, continue aspirin and statin and use as needed diuretic therapy as needed only. Resume diuretics on discharge if renal function stable.   Seizure disorder: Seizure precautions, will continue patient on his home regimen of Depakote.  Discharge Instructions  Discharge Instructions     Diet - low sodium heart healthy   Complete by: As directed    Increase activity slowly   Complete by: As directed       Allergies as of 12/15/2020       Reactions   Contrast Media [iodinated Diagnostic Agents] Shortness Of Breath   SOB   Iohexol Shortness Of Breath    Desc: Respiratory Distress, Laryngedema, diaphoresis, Onset Date: [id number]   Metrizamide Shortness Of Breath   SOB SOB SOB   Latex Rash        Medication List     STOP taking these medications    chlorthalidone 25 MG tablet Commonly known as: HYGROTON   diltiazem 120 MG 24 hr capsule Commonly known as: TIAZAC       TAKE these medications    aspirin 81 MG EC tablet Take 81 mg by mouth daily.   atorvastatin 80 MG tablet Commonly known as: LIPITOR Take 80 mg by mouth at bedtime.   bisacodyl 5 MG EC tablet Commonly known as: DULCOLAX Take 1 tablet (5 mg total) by mouth daily as needed for moderate constipation.   diltiazem 240 MG 24 hr capsule Commonly known as: CARDIZEM CD Take 1 capsule (240 mg total) by mouth daily.   divalproex 500 MG DR tablet Commonly known as: DEPAKOTE Take 500 mg by mouth daily.   Ensure Max Protein Liqd Take 330 mLs (11 oz total) by mouth 2 (two) times daily.   ferrous sulfate 325 (65 FE) MG tablet Take 325 mg by mouth daily with breakfast.   isosorbide mononitrate 120 MG 24 hr tablet Commonly known as: IMDUR Take 1 tablet (120 mg total) by mouth daily.   metFORMIN 500 MG tablet Commonly known as: GLUCOPHAGE Take 500 mg by mouth 2  (two) times daily with a meal.   metoprolol succinate 50 MG 24 hr tablet Commonly known as: TOPROL-XL Take 1 tablet (50 mg total) by mouth daily. Take with or immediately following a meal. What changed:  medication strength how much to take   nitroGLYCERIN 0.4 MG SL tablet Commonly known as: NITROSTAT Place 1 tablet (0.4 mg total) under the tongue every 5 (five) minutes as needed for chest pain.       ASK your doctor about these medications    Basaglar KwikPen 100 UNIT/ML Inject 20 Units into the skin 2 (two) times daily.   NovoLOG FlexPen 100 UNIT/ML FlexPen Generic drug: insulin aspart Inject 6 Units into the skin 3 (three) times daily with meals.   Rivaroxaban 15 MG Tabs tablet Commonly known as: XARELTO Take 1 tablet (15 mg total) by mouth daily with supper. Hold Xarelto for 3 days.  Start on January 09, 2019.        Follow-up Information     Ezequiel Kayser, MD. Schedule an appointment as soon as possible for a visit in 1 week(s).   Specialty: Internal Medicine Contact information: [address] [place] [address] [phone]  Corey Skains, MD. Schedule an appointment as soon as possible for a visit in 1 week(s).   Specialty: Cardiology Contact information: [facility] [place] [address] [phone]                Allergies  Allergen Reactions   Contrast Media [Iodinated Diagnostic Agents] Shortness Of Breath    SOB   Iohexol Shortness Of Breath     Desc: Respiratory Distress, Laryngedema, diaphoresis, Onset Date: [id number]    Metrizamide Shortness Of Breath    SOB SOB SOB    Latex Rash    Consultations: Cardiology   Procedures/Studies: DG Chest Portable 1 View  Result Date: 12/13/2020 CLINICAL DATA:  Pt was out with family and started "feeling funny" and experienced chest palpitations. Per EMS HR ranged between 120-160s. Hx of afib - states hes  semi-compliant with his Cardizem medication. Denies CP or SOB. EXAM: PORTABLE CHEST 1 VIEW COMPARISON:  11/09/2020 and older exams. FINDINGS: Cardiac silhouette is mildly enlarged. No mediastinal or hilar masses. Clear lungs.  No convincing pleural effusion.  No pneumothorax. Skeletal structures are grossly intact. IMPRESSION: No acute cardiopulmonary disease. Electronically Signed   By: Lajean Manes M.D.   On: 12/13/2020 23:00   ECHOCARDIOGRAM COMPLETE  Result Date: 12/14/2020    ECHOCARDIOGRAM REPORT   Patient Name:   [name] Date of Exam: 12/14/2020 Medical Rec #:  [id number]             Height:       67.0 in Accession #:    [id number]            Weight:       185.5 lb Date of Birth:  January 11, 1935            BSA:          1.959 m Patient Age:    86 years              BP:           165/89 mmHg Patient Gender: M                     HR:           72 bpm. Exam Location:  [facility] Procedure: 2D Echo and Intracardiac Opacification Agent Indications:     Atrial Fibrillation  History:         Patient has prior history of Echocardiogram examinations. CHF,                  Arrythmias:Atrial Fibrillation; Risk Factors:CKD and                  Dyslipidemia.  Sonographer:     L Thornton-Maynard Referring Phys:  [id number] PATEL Diagnosing Phys: Isaias Cowman MD  Sonographer Comments: Suboptimal apical window. IMPRESSIONS  1. Left ventricular ejection fraction, by estimation, is 35 to 40%. The left ventricle has moderately decreased function. The left ventricle demonstrates regional wall motion abnormalities (see scoring diagram/findings for description). Left ventricular  diastolic parameters were normal.  2. Right ventricular systolic function is normal. The right ventricular size is normal.  3. The mitral valve is normal in structure. Mild mitral valve regurgitation. No evidence of mitral stenosis.  4. The aortic valve is normal in structure. Aortic valve regurgitation is not visualized. No aortic  stenosis is present.  5. The inferior vena cava is normal in size with greater than 50% respiratory variability,  suggesting right atrial pressure of 3 mmHg. FINDINGS  Left Ventricle: Left ventricular ejection fraction, by estimation, is 35 to 40%. The left ventricle has moderately decreased function. The left ventricle demonstrates regional wall motion abnormalities. Definity contrast agent was given IV to delineate the left ventricular endocardial borders. The left ventricular internal cavity size was normal in size. There is no left ventricular hypertrophy. Left ventricular diastolic parameters were normal.  LV Wall Scoring: The apical lateral segment, apical septal segment, and apex are hypokinetic. Right Ventricle: The right ventricular size is normal. No increase in right ventricular wall thickness. Right ventricular systolic function is normal. Left Atrium: Left atrial size was normal in size. Right Atrium: Right atrial size was normal in size. Pericardium: There is no evidence of pericardial effusion. Mitral Valve: The mitral valve is normal in structure. Mild mitral valve regurgitation. No evidence of mitral valve stenosis. MV peak gradient, 2.8 mmHg. The mean mitral valve gradient is 1.0 mmHg. Tricuspid Valve: The tricuspid valve is normal in structure. Tricuspid valve regurgitation is mild . No evidence of tricuspid stenosis. Aortic Valve: The aortic valve is normal in structure. Aortic valve regurgitation is not visualized. No aortic stenosis is present. Aortic valve mean gradient measures 2.0 mmHg. Aortic valve peak gradient measures 3.9 mmHg. Aortic valve area, by VTI measures 2.25 cm. Pulmonic Valve: The pulmonic valve was normal in structure. Pulmonic valve regurgitation is not visualized. No evidence of pulmonic stenosis. Aorta: The aortic root is normal in size and structure. Venous: The inferior vena cava is normal in size with greater than 50% respiratory variability, suggesting right atrial  pressure of 3 mmHg. IAS/Shunts: No atrial level shunt detected by color flow Doppler.  LEFT VENTRICLE PLAX 2D LVIDd:         5.44 cm      Diastology LVIDs:         4.50 cm      LV e' medial:    2.51 cm/s LV PW:         1.10 cm      LV E/e' medial:  19.6 LV IVS:        1.63 cm      LV e' lateral:   2.36 cm/s LVOT diam:     2.10 cm      LV E/e' lateral: 20.9 LV SV:         49 LV SV Index:   25 LVOT Area:     3.46 cm  LV Volumes (MOD) LV vol d, MOD A2C: 107.0 ml LV vol d, MOD A4C: 88.3 ml LV vol s, MOD A2C: 58.9 ml LV vol s, MOD A4C: 54.5 ml LV SV MOD A2C:     48.1 ml LV SV MOD A4C:     88.3 ml LV SV MOD BP:      41.9 ml RIGHT VENTRICLE RV S prime:     11.90 cm/s TAPSE (M-mode): 1.5 cm LEFT ATRIUM             Index LA diam:        4.10 cm 2.09 cm/m LA Vol (A2C):   53.4 ml 27.26 ml/m LA Vol (A4C):   51.8 ml 26.45 ml/m LA Biplane Vol: 52.9 ml 27.01 ml/m  AORTIC VALVE AV Area (Vmax):    1.96 cm AV Area (Vmean):   1.75 cm AV Area (VTI):     2.25 cm AV Vmax:           99.10 cm/s AV Vmean:  71.500 cm/s AV VTI:            0.217 m AV Peak Grad:      3.9 mmHg AV Mean Grad:      2.0 mmHg LVOT Vmax:         56.10 cm/s LVOT Vmean:        36.100 cm/s LVOT VTI:          0.141 m LVOT/AV VTI ratio: 0.65  AORTA Ao Root diam: 3.40 cm MITRAL VALVE MV Area (PHT): 3.19 cm    SHUNTS MV Area VTI:   2.44 cm    Systemic VTI:  0.14 m MV Peak grad:  2.8 mmHg    Systemic Diam: 2.10 cm MV Mean grad:  1.0 mmHg MV Vmax:       0.84 m/s MV Vmean:      52.3 cm/s MV E velocity: 49.30 cm/s MV A velocity: 76.70 cm/s MV E/A ratio:  0.64 Isaias Cowman MD Electronically signed by Isaias Cowman MD Signature Date/Time: 12/14/2020/1:55:11 PM    Final    (Echo, Carotid, EGD, Colonoscopy, ERCP)    Subjective: Seen and examined on the day of discharge.  Stable no distress.  Stable for discharge home.  Follow-up outpatient cardiology and PCP.  Discharge Exam: Vitals:   12/15/20 1144 12/15/20 1145  BP: (!) 145/91 (!) 145/91   Pulse:  65  Resp:  18  Temp:  98 F (36.7 C)  SpO2:  95%   Vitals:   12/15/20 0800 12/15/20 0900 12/15/20 1144 12/15/20 1145  BP: (!) 103/92  (!) 145/91 (!) 145/91  Pulse: (!) 135 67  65  Resp: '17 19  18  '$ Temp:    98 F (36.7 C)  TempSrc:    Oral  SpO2: 92% 93%  95%  Weight:      Height:        General: Pt is alert, awake, not in acute distress Cardiovascular: RRR, S1/S2 +, no rubs, no gallops Respiratory: CTA bilaterally, no wheezing, no rhonchi Abdominal: Soft, NT, ND, bowel sounds + Extremities: Bilateral BKA    The results of significant diagnostics from this hospitalization (including imaging, microbiology, ancillary and laboratory) are listed below for reference.     Microbiology: Recent Results (from the past 240 hour(s))  Resp Panel by RT-PCR (Flu A&B, Covid) Nasopharyngeal Swab     Status: None   Collection Time: 12/13/20 10:00 PM   Specimen: Nasopharyngeal Swab; Nasopharyngeal(NP) swabs in vial transport medium  Result Value Ref Range Status   SARS Coronavirus 2 by RT PCR NEGATIVE NEGATIVE Final    Comment: (NOTE) SARS-CoV-2 target nucleic acids are NOT DETECTED.  The SARS-CoV-2 RNA is generally detectable in upper respiratory specimens during the acute phase of infection. The lowest concentration of SARS-CoV-2 viral copies this assay can detect is 138 copies/mL. A negative result does not preclude SARS-Cov-2 infection and should not be used as the sole basis for treatment or other patient management decisions. A negative result may occur with  improper specimen collection/handling, submission of specimen other than nasopharyngeal swab, presence of viral mutation(s) within the areas targeted by this assay, and inadequate number of viral copies(<138 copies/mL). A negative result must be combined with clinical observations, patient history, and epidemiological information. The expected result is Negative.  Fact Sheet for Patients:   EntrepreneurPulse.com.au  Fact Sheet for Healthcare Providers:  IncredibleEmployment.be  This test is no t yet approved or cleared by the [place] FDA and  has been authorized for detection  and/or diagnosis of SARS-CoV-2 by FDA under an Emergency Use Authorization (EUA). This EUA will remain  in effect (meaning this test can be used) for the duration of the COVID-19 declaration under Section 564(b)(1) of the Act, 21 U.S.C.section 360bbb-3(b)(1), unless the authorization is terminated  or revoked sooner.       Influenza A by PCR NEGATIVE NEGATIVE Final   Influenza B by PCR NEGATIVE NEGATIVE Final    Comment: (NOTE) The Xpert Xpress SARS-CoV-2/FLU/RSV plus assay is intended as an aid in the diagnosis of influenza from Nasopharyngeal swab specimens and should not be used as a sole basis for treatment. Nasal washings and aspirates are unacceptable for Xpert Xpress SARS-CoV-2/FLU/RSV testing.  Fact Sheet for Patients: EntrepreneurPulse.com.au  Fact Sheet for Healthcare Providers: IncredibleEmployment.be  This test is not yet approved or cleared by the [place] FDA and has been authorized for detection and/or diagnosis of SARS-CoV-2 by FDA under an Emergency Use Authorization (EUA). This EUA will remain in effect (meaning this test can be used) for the duration of the COVID-19 declaration under Section 564(b)(1) of the Act, 21 U.S.C. section 360bbb-3(b)(1), unless the authorization is terminated or revoked.  Performed at [facility], [address] [place], [place] [address]      Labs: BNP (last 3 results) Recent Labs    07/28/20 1255 10/24/20 0059 11/09/20 2115  BNP 385.2* 464.5* [id number]   Basic Metabolic Panel: Recent Labs  Lab 12/13/20 2133 12/14/20 0938 12/15/20 0559  NA 131* 139 137  K 4.1 4.0 3.3*  CL 94* 104 101  CO2 '22 26 28  '$ GLUCOSE 751* 479* 173*  BUN 34* 26*  25*  CREATININE 2.02* 1.58* 1.38*  CALCIUM 9.2 8.4* 8.2*  MG  --  1.9 1.6*  PHOS  --  3.2 2.9   Liver Function Tests: No results for input(s): AST, ALT, ALKPHOS, BILITOT, PROT, ALBUMIN in the last 168 hours. No results for input(s): LIPASE, AMYLASE in the last 168 hours. No results for input(s): AMMONIA in the last 168 hours. CBC: Recent Labs  Lab 12/13/20 2133 12/14/20 0938 12/15/20 0559  WBC 11.9* 10.2 11.4*  NEUTROABS 8.7*  --   --   HGB 16.2 14.8 14.2  HCT 48.1 43.1 40.7  MCV 89.2 89.4 87.2  PLT 232 174 202   Cardiac Enzymes: No results for input(s): CKTOTAL, CKMB, CKMBINDEX, TROPONINI in the last 168 hours. BNP: Invalid input(s): POCBNP CBG: Recent Labs  Lab 12/14/20 0744 12/14/20 1050 12/14/20 1135 12/14/20 1701 12/15/20 0849  GLUCAP 579* 357* 375* 152* 138*   D-Dimer No results for input(s): DDIMER in the last 72 hours. Hgb A1c No results for input(s): HGBA1C in the last 72 hours. Lipid Profile No results for input(s): CHOL, HDL, LDLCALC, TRIG, CHOLHDL, LDLDIRECT in the last 72 hours. Thyroid function studies Recent Labs    12/13/20 2133  TSH 3.097   Anemia work up No results for input(s): VITAMINB12, FOLATE, FERRITIN, TIBC, IRON, RETICCTPCT in the last 72 hours. Urinalysis    Component Value Date/Time   COLORURINE STRAW (A) 12/13/2020 2153   APPEARANCEUR CLEAR (A) 12/13/2020 2153   LABSPEC 1.024 12/13/2020 2153   PHURINE 5.0 12/13/2020 2153   GLUCOSEU >=500 (A) 12/13/2020 2153   HGBUR MODERATE (A) 12/13/2020 2153   BILIRUBINUR NEGATIVE 12/13/2020 2153   KETONESUR 5 (A) 12/13/2020 2153   PROTEINUR NEGATIVE 12/13/2020 2153   NITRITE NEGATIVE 12/13/2020 2153   LEUKOCYTESUR NEGATIVE 12/13/2020 2153   Sepsis Labs Invalid input(s): PROCALCITONIN,  WBC,  [place] [facility] Recent Results (from the past 240 hour(s))  Resp Panel by RT-PCR (Flu A&B, Covid) Nasopharyngeal Swab     Status: None   Collection Time: 12/13/20 10:00 PM   Specimen:  Nasopharyngeal Swab; Nasopharyngeal(NP) swabs in vial transport medium  Result Value Ref Range Status   SARS Coronavirus 2 by RT PCR NEGATIVE NEGATIVE Final    Comment: (NOTE) SARS-CoV-2 target nucleic acids are NOT DETECTED.  The SARS-CoV-2 RNA is generally detectable in upper respiratory specimens during the acute phase of infection. The lowest concentration of SARS-CoV-2 viral copies this assay can detect is 138 copies/mL. A negative result does not preclude SARS-Cov-2 infection and should not be used as the sole basis for treatment or other patient management decisions. A negative result may occur with  improper specimen collection/handling, submission of specimen other than nasopharyngeal swab, presence of viral mutation(s) within the areas targeted by this assay, and inadequate number of viral copies(<138 copies/mL). A negative result must be combined with clinical observations, patient history, and epidemiological information. The expected result is Negative.  Fact Sheet for Patients:  EntrepreneurPulse.com.au  Fact Sheet for Healthcare Providers:  IncredibleEmployment.be  This test is no t yet approved or cleared by the [place] FDA and  has been authorized for detection and/or diagnosis of SARS-CoV-2 by FDA under an Emergency Use Authorization (EUA). This EUA will remain  in effect (meaning this test can be used) for the duration of the COVID-19 declaration under Section 564(b)(1) of the Act, 21 U.S.C.section 360bbb-3(b)(1), unless the authorization is terminated  or revoked sooner.       Influenza A by PCR NEGATIVE NEGATIVE Final   Influenza B by PCR NEGATIVE NEGATIVE Final    Comment: (NOTE) The Xpert Xpress SARS-CoV-2/FLU/RSV plus assay is intended as an aid in the diagnosis of influenza from Nasopharyngeal swab specimens and should not be used as a sole basis for treatment. Nasal washings and aspirates are unacceptable for  Xpert Xpress SARS-CoV-2/FLU/RSV testing.  Fact Sheet for Patients: EntrepreneurPulse.com.au  Fact Sheet for Healthcare Providers: IncredibleEmployment.be  This test is not yet approved or cleared by the [place] FDA and has been authorized for detection and/or diagnosis of SARS-CoV-2 by FDA under an Emergency Use Authorization (EUA). This EUA will remain in effect (meaning this test can be used) for the duration of the COVID-19 declaration under Section 564(b)(1) of the Act, 21 U.S.C. section 360bbb-3(b)(1), unless the authorization is terminated or revoked.  Performed at [facility], [address], [place], [place] [address]      Time coordinating discharge: Over 30 minutes  SIGNED:   Sidney Ace, MD  [facility] 12/15/2020, 3:53 PM Pager   If 7PM-7AM, please contact night-coverage

## 2020-12-15 NOTE — Progress Notes (Signed)
[facility] Cardiology  SUBJECTIVE: Patient laying in bed, denies chest pain or shortness of breath   Vitals:   12/15/20 0129 12/15/20 0400 12/15/20 0526 12/15/20 0600  BP: 121/73 129/78 126/63 114/67  Pulse: 65 63 61 63  Resp: '19 18 18 18  '$ Temp:      TempSrc:      SpO2: 94% 96% 90% 96%  Weight:      Height:         Intake/Output Summary (Last 24 hours) at 12/15/2020 [id number] Last data filed at 12/14/2020 1104 Gross per 24 hour  Intake 980 ml  Output --  Net 980 ml      PHYSICAL EXAM  General: Well developed, well nourished, in no acute distress HEENT:  Normocephalic and atramatic Neck:  No JVD.  Lungs: Clear bilaterally to auscultation and percussion. Heart: HRRR . Normal S1 and S2 without gallops or murmurs.  Abdomen: Bowel sounds are positive, abdomen soft and non-tender  Msk:  Back normal, normal gait. Normal strength and tone for age. Extremities: No clubbing, cyanosis or edema.   Neuro: Alert and oriented X 3. Psych:  Good affect, responds appropriately   LABS: Basic Metabolic Panel: Recent Labs    12/14/20 0938 12/15/20 0559  NA 139 137  K 4.0 3.3*  CL 104 101  CO2 26 28  GLUCOSE 479* 173*  BUN 26* 25*  CREATININE 1.58* 1.38*  CALCIUM 8.4* 8.2*  MG 1.9 1.6*  PHOS 3.2 2.9   Liver Function Tests: No results for input(s): AST, ALT, ALKPHOS, BILITOT, PROT, ALBUMIN in the last 72 hours. No results for input(s): LIPASE, AMYLASE in the last 72 hours. CBC: Recent Labs    12/13/20 2133 12/14/20 0938 12/15/20 0559  WBC 11.9* 10.2 11.4*  NEUTROABS 8.7*  --   --   HGB 16.2 14.8 14.2  HCT 48.1 43.1 40.7  MCV 89.2 89.4 87.2  PLT 232 174 202   Cardiac Enzymes: No results for input(s): CKTOTAL, CKMB, CKMBINDEX, TROPONINI in the last 72 hours. BNP: Invalid input(s): POCBNP D-Dimer: No results for input(s): DDIMER in the last 72 hours. Hemoglobin A1C: No results for input(s): HGBA1C in the last 72 hours. Fasting Lipid Panel: No results for input(s): CHOL,  HDL, LDLCALC, TRIG, CHOLHDL, LDLDIRECT in the last 72 hours. Thyroid Function Tests: Recent Labs    12/13/20 2133  TSH 3.097   Anemia Panel: No results for input(s): VITAMINB12, FOLATE, FERRITIN, TIBC, IRON, RETICCTPCT in the last 72 hours.  DG Chest Portable 1 View  Result Date: 12/13/2020 CLINICAL DATA:  Pt was out with family and started "feeling funny" and experienced chest palpitations. Per EMS HR ranged between 120-160s. Hx of afib - states hes semi-compliant with his Cardizem medication. Denies CP or SOB. EXAM: PORTABLE CHEST 1 VIEW COMPARISON:  11/09/2020 and older exams. FINDINGS: Cardiac silhouette is mildly enlarged. No mediastinal or hilar masses. Clear lungs.  No convincing pleural effusion.  No pneumothorax. Skeletal structures are grossly intact. IMPRESSION: No acute cardiopulmonary disease. Electronically Signed   By: Lajean Manes M.D.   On: 12/13/2020 23:00   ECHOCARDIOGRAM COMPLETE  Result Date: 12/14/2020    ECHOCARDIOGRAM REPORT   Patient Name:   [name] Date of Exam: 12/14/2020 Medical Rec #:  [id number]             Height:       67.0 in Accession #:    [id number]            Weight:  185.5 lb Date of Birth:  [birth year]            BSA:          1.959 m Patient Age:    86 years              BP:           165/89 mmHg Patient Gender: M                     HR:           72 bpm. Exam Location:  [facility] Procedure: 2D Echo and Intracardiac Opacification Agent Indications:     Atrial Fibrillation  History:         Patient has prior history of Echocardiogram examinations. CHF,                  Arrythmias:Atrial Fibrillation; Risk Factors:CKD and                  Dyslipidemia.  Sonographer:     L Thornton-Maynard Referring Phys:  [id number] PATEL Diagnosing Phys: Isaias Cowman MD  Sonographer Comments: Suboptimal apical window. IMPRESSIONS  1. Left ventricular ejection fraction, by estimation, is 35 to 40%. The left ventricle has moderately decreased function. The  left ventricle demonstrates regional wall motion abnormalities (see scoring diagram/findings for description). Left ventricular  diastolic parameters were normal.  2. Right ventricular systolic function is normal. The right ventricular size is normal.  3. The mitral valve is normal in structure. Mild mitral valve regurgitation. No evidence of mitral stenosis.  4. The aortic valve is normal in structure. Aortic valve regurgitation is not visualized. No aortic stenosis is present.  5. The inferior vena cava is normal in size with greater than 50% respiratory variability, suggesting right atrial pressure of 3 mmHg. FINDINGS  Left Ventricle: Left ventricular ejection fraction, by estimation, is 35 to 40%. The left ventricle has moderately decreased function. The left ventricle demonstrates regional wall motion abnormalities. Definity contrast agent was given IV to delineate the left ventricular endocardial borders. The left ventricular internal cavity size was normal in size. There is no left ventricular hypertrophy. Left ventricular diastolic parameters were normal.  LV Wall Scoring: The apical lateral segment, apical septal segment, and apex are hypokinetic. Right Ventricle: The right ventricular size is normal. No increase in right ventricular wall thickness. Right ventricular systolic function is normal. Left Atrium: Left atrial size was normal in size. Right Atrium: Right atrial size was normal in size. Pericardium: There is no evidence of pericardial effusion. Mitral Valve: The mitral valve is normal in structure. Mild mitral valve regurgitation. No evidence of mitral valve stenosis. MV peak gradient, 2.8 mmHg. The mean mitral valve gradient is 1.0 mmHg. Tricuspid Valve: The tricuspid valve is normal in structure. Tricuspid valve regurgitation is mild . No evidence of tricuspid stenosis. Aortic Valve: The aortic valve is normal in structure. Aortic valve regurgitation is not visualized. No aortic stenosis is  present. Aortic valve mean gradient measures 2.0 mmHg. Aortic valve peak gradient measures 3.9 mmHg. Aortic valve area, by VTI measures 2.25 cm. Pulmonic Valve: The pulmonic valve was normal in structure. Pulmonic valve regurgitation is not visualized. No evidence of pulmonic stenosis. Aorta: The aortic root is normal in size and structure. Venous: The inferior vena cava is normal in size with greater than 50% respiratory variability, suggesting right atrial pressure of 3 mmHg. IAS/Shunts: No atrial level shunt detected by color flow Doppler.  LEFT VENTRICLE PLAX 2D LVIDd:         5.44 cm      Diastology LVIDs:         4.50 cm      LV e' medial:    2.51 cm/s LV PW:         1.10 cm      LV E/e' medial:  19.6 LV IVS:        1.63 cm      LV e' lateral:   2.36 cm/s LVOT diam:     2.10 cm      LV E/e' lateral: 20.9 LV SV:         49 LV SV Index:   25 LVOT Area:     3.46 cm  LV Volumes (MOD) LV vol d, MOD A2C: 107.0 ml LV vol d, MOD A4C: 88.3 ml LV vol s, MOD A2C: 58.9 ml LV vol s, MOD A4C: 54.5 ml LV SV MOD A2C:     48.1 ml LV SV MOD A4C:     88.3 ml LV SV MOD BP:      41.9 ml RIGHT VENTRICLE RV S prime:     11.90 cm/s TAPSE (M-mode): 1.5 cm LEFT ATRIUM             Index LA diam:        4.10 cm 2.09 cm/m LA Vol (A2C):   53.4 ml 27.26 ml/m LA Vol (A4C):   51.8 ml 26.45 ml/m LA Biplane Vol: 52.9 ml 27.01 ml/m  AORTIC VALVE AV Area (Vmax):    1.96 cm AV Area (Vmean):   1.75 cm AV Area (VTI):     2.25 cm AV Vmax:           99.10 cm/s AV Vmean:          71.500 cm/s AV VTI:            0.217 m AV Peak Grad:      3.9 mmHg AV Mean Grad:      2.0 mmHg LVOT Vmax:         56.10 cm/s LVOT Vmean:        36.100 cm/s LVOT VTI:          0.141 m LVOT/AV VTI ratio: 0.65  AORTA Ao Root diam: 3.40 cm MITRAL VALVE MV Area (PHT): 3.19 cm    SHUNTS MV Area VTI:   2.44 cm    Systemic VTI:  0.14 m MV Peak grad:  2.8 mmHg    Systemic Diam: 2.10 cm MV Mean grad:  1.0 mmHg MV Vmax:       0.84 m/s MV Vmean:      52.3 cm/s MV E velocity:  49.30 cm/s MV A velocity: 76.70 cm/s MV E/A ratio:  0.64 Isaias Cowman MD Electronically signed by Isaias Cowman MD Signature Date/Time: 12/14/2020/1:55:11 PM    Final      Echo   TELEMETRY: Sinus arrhythmia 86 bpm:  ASSESSMENT AND PLAN:  Principal Problem:   Atrial fibrillation with rapid ventricular response (HCC) Active Problems:   Primary hypertension   PAD (peripheral artery disease) (HCC)   AKI (acute kidney injury) (HCC)   Chronic diastolic heart failure (HCC)   S/P bilateral BKA (below knee amputation) (HCC)   Seizure disorder (HCC)   CAD (coronary artery disease)   Hyperglycemia due to type 2 diabetes mellitus (HCC)   GERD (gastroesophageal reflux disease)    1.  Atrial fibrillation with rapid ventricular rate, converted  to sinus rhythm with diltiazem bolus and drip, on Xarelto for stroke prevention 2.  Type II MI due to demand supply ischemia, with downtrending high sensitivity troponin (161, 3036, 2769) in the setting of atrial fibrillation with rapid ventricular rate, currently chest pain-free 3.  Coronary artery disease, with occluded LAD, 50% stenosis ostial RCA, marked cardiac catheterization 06/25/2019 which appears unchanged compared to prior cardiac catheterization from 06/05/2018 4.  Chronic kidney disease with BUN and creatinine 34 and 2.02, respectively   Recommendations   1.  Agree with current therapy 2.  Defer heparin infusion  3.  Continue Xarelto for stroke prevention 4.  Continue metoprolol succinate 50 mg daily 5.  Transition diltiazem 60 mg p.o. Q 6h to Cardizem CD 240 mg daily 6.  Defer repeat cardiac catheterization at this time.  Patient had recent cardiac catheterization 06/25/2019 which appeared unchanged to prior cardiac catheterization from 06/05/2018.  Patient has significant chronic kidney disease and would be at high risk for contrast-induced nephrotoxicity.  Patient currently is chest pain-free. 7.  May DC home, follow-up Dr. Nehemiah Massed 1  to 2 weeks   Isaias Cowman, MD, PhD, [facility] 12/15/2020 8:08 AM

## 2020-12-15 NOTE — ED Notes (Signed)
Per pt son, will be here to pick pt up around noon today.

## 2020-12-15 NOTE — ED Notes (Signed)
MD at bedside assessing pt and updating pt on plan of care.  

## 2021-01-27 ENCOUNTER — Ambulatory Visit (INDEPENDENT_AMBULATORY_CARE_PROVIDER_SITE_OTHER): Payer: Medicare Other | Admitting: Vascular Surgery

## 2021-02-03 ENCOUNTER — Ambulatory Visit (INDEPENDENT_AMBULATORY_CARE_PROVIDER_SITE_OTHER): Payer: Medicare Other | Admitting: Vascular Surgery

## 2021-02-17 ENCOUNTER — Other Ambulatory Visit: Payer: Self-pay

## 2021-02-17 ENCOUNTER — Ambulatory Visit (INDEPENDENT_AMBULATORY_CARE_PROVIDER_SITE_OTHER): Payer: Medicare Other | Admitting: Vascular Surgery

## 2021-02-17 ENCOUNTER — Encounter (INDEPENDENT_AMBULATORY_CARE_PROVIDER_SITE_OTHER): Payer: Self-pay | Admitting: Vascular Surgery

## 2021-02-17 VITALS — BP 148/67 | HR 55 | Resp 16

## 2021-02-17 DIAGNOSIS — I482 Chronic atrial fibrillation, unspecified: Secondary | ICD-10-CM

## 2021-02-17 DIAGNOSIS — I10 Essential (primary) hypertension: Secondary | ICD-10-CM | POA: Diagnosis not present

## 2021-02-17 DIAGNOSIS — I70269 Atherosclerosis of native arteries of extremities with gangrene, unspecified extremity: Secondary | ICD-10-CM

## 2021-02-17 DIAGNOSIS — Z89512 Acquired absence of left leg below knee: Secondary | ICD-10-CM

## 2021-02-17 DIAGNOSIS — Z89511 Acquired absence of right leg below knee: Secondary | ICD-10-CM

## 2021-02-17 DIAGNOSIS — E118 Type 2 diabetes mellitus with unspecified complications: Secondary | ICD-10-CM | POA: Diagnosis not present

## 2021-02-17 NOTE — Assessment & Plan Note (Signed)
Now status post bilateral BKA

## 2021-02-17 NOTE — Progress Notes (Signed)
MRN : [id number]  [name] is a 86 y.o. (August 01, 1934) male who presents with chief complaint of  Chief Complaint  Patient presents with   Follow-up    40yr follow up  .  History of Present Illness: Patient returns today in follow up of PAD and bilateral BKAs that have been performed previously.  He actually is doing quite well.  He has bilateral lower extremity prostheses and he is actually doing some walking with a walker.  To be honest, this is actually surprising to me given the debilitated state that we started with, but it is encouraging to see him improving.  He still has multiple medical issues.  He is not a great historian and his daughter and wife provides some of the history today.  His wounds are well-healed.  He is not having significant lower extremity pain or other issues.  Current Outpatient Medications  Medication Sig Dispense Refill   aspirin 81 MG EC tablet Take 81 mg by mouth daily.     atorvastatin (LIPITOR) 80 MG tablet Take 80 mg by mouth at bedtime.     bisacodyl (DULCOLAX) 5 MG EC tablet Take 1 tablet (5 mg total) by mouth daily as needed for moderate constipation. 30 tablet 0   divalproex (DEPAKOTE) 500 MG DR tablet Take 500 mg by mouth daily.      Ensure Max Protein (ENSURE MAX PROTEIN) LIQD Take 330 mLs (11 oz total) by mouth 2 (two) times daily.     ferrous sulfate 325 (65 FE) MG tablet Take 325 mg by mouth daily with breakfast.     Insulin Glargine (BASAGLAR KWIKPEN) 100 UNIT/ML Inject 20 Units into the skin 2 (two) times daily. (Patient taking differently: Inject 25 Units into the skin at bedtime.)     isosorbide mononitrate (IMDUR) 120 MG 24 hr tablet Take 1 tablet (120 mg total) by mouth daily. 30 tablet 0   metFORMIN (GLUCOPHAGE) 500 MG tablet Take 500 mg by mouth 2 (two) times daily with a meal.     nitroGLYCERIN (NITROSTAT) 0.4 MG SL tablet Place 1 tablet (0.4 mg total) under the tongue every 5 (five) minutes as needed for chest pain. 20 tablet  12   NOVOLOG FLEXPEN 100 UNIT/ML FlexPen Inject 6 Units into the skin 3 (three) times daily with meals. (Patient taking differently: Inject 6 Units into the skin daily.) 15 mL 0   Rivaroxaban (XARELTO) 15 MG TABS tablet Take 1 tablet (15 mg total) by mouth daily with supper. Hold Xarelto for 3 days.  Start on January 09, 2019. (Patient taking differently: Take 15 mg by mouth daily with supper.) 30 tablet 1   diltiazem (CARDIZEM CD) 240 MG 24 hr capsule Take 1 capsule (240 mg total) by mouth daily. 30 capsule 0   metoprolol succinate (TOPROL-XL) 50 MG 24 hr tablet Take 1 tablet (50 mg total) by mouth daily. Take with or immediately following a meal. 30 tablet 0   No current facility-administered medications for this visit.    Past Medical History:  Diagnosis Date   Arthritis    Atrial fibrillation ([place])    Carcinoma of prostate ([place])    CHF (congestive heart failure) ([place])    Chronic kidney disease    Diabetes mellitus without complication [facility])    ED (erectile dysfunction)    Frequent urination    Hyperlipidemia    Hypertension    Moderate mitral insufficiency    Peripheral vascular disease ([place])    Pneumonia 04/2016  Prostate cancer ([place])    Sleep apnea    OSA--USE C-PAP   Ulcer of left foot due to type 2 diabetes mellitus ([place])    Urinary stress incontinence, male     Past Surgical History:  Procedure Laterality Date   AMPUTATION Left 01/12/2017   Procedure: AMPUTATION BELOW KNEE;  Surgeon: Algernon Huxley, MD;  Location: [facility] ORS;  Service: General;  Laterality: Left;   AMPUTATION Right 12/27/2018   Procedure: AMPUTATION BELOW KNEE;  Surgeon: Algernon Huxley, MD;  Location: [facility] ORS;  Service: General;  Laterality: Right;   APLIGRAFT PLACEMENT Left 10/15/2016   Procedure: APLIGRAFT PLACEMENT;  Surgeon: Albertine Patricia, DPM;  Location: [facility] ORS;  Service: Podiatry;  Laterality: Left;  necrotic ulcer   CHOLECYSTECTOMY     COLONOSCOPY WITH PROPOFOL N/A 01/02/2019   Procedure:  COLONOSCOPY WITH PROPOFOL;  Surgeon: Lucilla Lame, MD;  Location: [facility] ENDOSCOPY;  Service: Endoscopy;  Laterality: N/A;   COLONOSCOPY WITH PROPOFOL N/A 01/05/2019   Procedure: COLONOSCOPY WITH PROPOFOL;  Surgeon: Lucilla Lame, MD;  Location: [facility] ENDOSCOPY;  Service: Endoscopy;  Laterality: N/A;   ESOPHAGOGASTRODUODENOSCOPY (EGD) WITH PROPOFOL N/A 01/02/2019   Procedure: ESOPHAGOGASTRODUODENOSCOPY (EGD) WITH PROPOFOL;  Surgeon: Lucilla Lame, MD;  Location: [facility] ENDOSCOPY;  Service: Endoscopy;  Laterality: N/A;   EYE SURGERY Bilateral    Cataract Extraction with IOL   HIP ARTHROPLASTY Right 10/13/2018   Procedure: ARTHROPLASTY BIPOLAR HIP (HEMIARTHROPLASTY);  Surgeon: Earnestine Leys, MD;  Location: [facility] OR[facility];  Service: Orthopedics;  Laterality: Right;   IRRIGATION AND DEBRIDEMENT FOOT Left 10/15/2016   Procedure: IRRIGATION AND DEBRIDEMENT FOOT-EXCISIONAL DEBRIDEMENT OF SKIN 3RD, 4TH AND 5TH TOES WITH APPLICATION OF APLIGRAFT;  Surgeon: Albertine Patricia, DPM;  Location: [facility] OR[facility];  Service: Podiatry;  Laterality: Left;  necrotic,gangrene   IRRIGATION AND DEBRIDEMENT FOOT Left 12/09/2016   Procedure: IRRIGATION AND DEBRIDEMENT FOOT-MUSCLE/FASCIA, RESECTION OF FOURTH AND FIFTH METATARSAL NECROTIC BONE;  Surgeon: Albertine Patricia, DPM;  Location: [facility];  Service: Podiatry;  Laterality: Left;   IRRIGATION AND DEBRIDEMENT FOOT Right 12/23/2018   Procedure: IRRIGATION AND DEBRIDEMENT FOOT and wound vac application;  Surgeon: Samara Deist, DPM;  Location: [facility];  Service: Podiatry;  Laterality: Right;   LEFT HEART CATH N/A 06/25/2019   Procedure: Left Heart Cath and Coronary Angiography;  Surgeon: Isaias Cowman, MD;  Location: [place] CV LAB;  Service: Cardiovascular;  Laterality: N/A;   LEFT HEART CATH AND CORONARY ANGIOGRAPHY N/A 06/05/2018   Procedure: LEFT HEART CATH AND CORONARY ANGIOGRAPHY;  Surgeon: Yolonda Kida, MD;  Location: [place] CV LAB;  Service: Cardiovascular;   Laterality: N/A;   LOWER EXTREMITY ANGIOGRAPHY Left 08/16/2016   Procedure: Lower Extremity Angiography;  Surgeon: Algernon Huxley, MD;  Location: [place] CV LAB;  Service: Cardiovascular;  Laterality: Left;   LOWER EXTREMITY ANGIOGRAPHY Left 09/01/2016   Procedure: Lower Extremity Angiography;  Surgeon: Algernon Huxley, MD;  Location: [place] CV LAB;  Service: Cardiovascular;  Laterality: Left;   LOWER EXTREMITY ANGIOGRAPHY Left 10/14/2016   Procedure: Lower Extremity Angiography;  Surgeon: Algernon Huxley, MD;  Location: [place] CV LAB;  Service: Cardiovascular;  Laterality: Left;   LOWER EXTREMITY ANGIOGRAPHY Left 12/20/2016   Procedure: Lower Extremity Angiography;  Surgeon: Algernon Huxley, MD;  Location: [place] CV LAB;  Service: Cardiovascular;  Laterality: Left;   LOWER EXTREMITY ANGIOGRAPHY Right 12/18/2018   Procedure: LOWER EXTREMITY ANGIOGRAPHY;  Surgeon: Algernon Huxley, MD;  Location: [place] CV LAB;  Service: Cardiovascular;  Laterality:  Right;   LOWER EXTREMITY INTERVENTION  09/01/2016   Procedure: Lower Extremity Intervention;  Surgeon: Algernon Huxley, MD;  Location: [place] CV LAB;  Service: Cardiovascular;;   PROSTATE SURGERY     removal   TONSILLECTOMY     as a child     Social History   Tobacco Use   Smoking status: Former    Packs/day: 0.25    Types: Cigarettes    Quit date: 10/14/1994    Years since quitting: 26.3   Smokeless tobacco: Never  Vaping Use   Vaping Use: Never used  Substance Use Topics   Alcohol use: No    Alcohol/week: 0.0 standard drinks   Drug use: No       Family History  Problem Relation Age of Onset   Hypertension Mother    Diabetes Mother    Prostate cancer Neg Hx    Bladder Cancer Neg Hx    Kidney disease Neg Hx      Allergies  Allergen Reactions   Contrast Media [Iodinated Diagnostic Agents] Shortness Of Breath    SOB   Iohexol Shortness Of Breath     Desc: Respiratory Distress, Laryngedema, diaphoresis, Onset  Date: [id number]    Metrizamide Shortness Of Breath    SOB SOB SOB    Latex Rash     REVIEW OF SYSTEMS (Negative unless checked)  Constitutional: [] Weight loss  [] Fever  [] Chills Cardiac: [] Chest pain   [] Chest pressure   [x] Palpitations   [] Shortness of breath when laying flat   [] Shortness of breath at rest   [x] Shortness of breath with exertion. Vascular:  [] Pain in legs with walking   [] Pain in legs at rest   [] Pain in legs when laying flat   [] Claudication   [] Pain in feet when walking  [] Pain in feet at rest  [] Pain in feet when laying flat   [] History of DVT   [] Phlebitis   [] Swelling in legs   [] Varicose veins   [] Non-healing ulcers Pulmonary:   [] Uses home oxygen   [] Productive cough   [] Hemoptysis   [] Wheeze  [] COPD   [] Asthma Neurologic:  [] Dizziness  [] Blackouts   [] Seizures   [] History of stroke   [] History of TIA  [] Aphasia   [] Temporary blindness   [] Dysphagia   [] Weakness or numbness in arms   [] Weakness or numbness in legs Musculoskeletal:  [x] Arthritis   [] Joint swelling   [x] Joint pain   [] Low back pain Hematologic:  [] Easy bruising  [] Easy bleeding   [] Hypercoagulable state   [] Anemic   Gastrointestinal:  [] Blood in stool   [] Vomiting blood  [] Gastroesophageal reflux/heartburn   [] Abdominal pain Genitourinary:  [] Chronic kidney disease   [] Difficult urination  [] Frequent urination  [] Burning with urination   [] Hematuria Skin:  [] Rashes   [] Ulcers   [] Wounds Psychological:  [] History of anxiety   []  History of major depression.  Physical Examination  BP (!) 148/67 (BP Location: Left Arm)   Pulse (!) 55   Resp 16  Gen:  WD/WN, NAD Head: /AT, No temporalis wasting. Ear/Nose/Throat: Hearing grossly intact, nares w/o erythema or drainage Eyes: Conjunctiva clear. Sclera non-icteric Neck: Supple.  Trachea midline Pulmonary:  Good air movement, no use of accessory muscles.  Cardiac: Irregular Vascular:  Vessel Right Left  Radial Palpable Palpable                           PT Not palpable Not palpable  DP Not palpable Not palpable  Gastrointestinal: soft, non-tender/non-distended. No guarding/reflex.  Musculoskeletal: M/S 5/5 throughout.  Bilateral below-knee amputations well-healed with prostheses in place today. Neurologic: Sensation grossly intact in extremities.  Symmetrical.  Speech is fluent.  Psychiatric: Judgment and insight are fair.  Patient is a below average historian Dermatologic: No rashes or ulcers noted.  No cellulitis or open wounds.      Labs Recent Results (from the past 2160 hour(s))  CBC with Differential     Status: Abnormal   Collection Time: 12/13/20  9:33 PM  Result Value Ref Range   WBC 11.9 (H) 4.0 - 10.5 K/uL   RBC 5.39 4.22 - 5.81 MIL/uL   Hemoglobin 16.2 13.0 - 17.0 g/dL   HCT 48.1 39.0 - 52.0 %   MCV 89.2 80.0 - 100.0 fL   MCH 30.1 26.0 - 34.0 pg   MCHC 33.7 30.0 - 36.0 g/dL   RDW 13.0 11.5 - 15.5 %   Platelets 232 150 - 400 K/uL   nRBC 0.0 0.0 - 0.2 %   Neutrophils Relative % 73 %   Neutro Abs 8.7 (H) 1.7 - 7.7 K/uL   Lymphocytes Relative 15 %   Lymphs Abs 1.8 0.7 - 4.0 K/uL   Monocytes Relative 9 %   Monocytes Absolute 1.1 (H) 0.1 - 1.0 K/uL   Eosinophils Relative 2 %   Eosinophils Absolute 0.2 0.0 - 0.5 K/uL   Basophils Relative 1 %   Basophils Absolute 0.1 0.0 - 0.1 K/uL   Immature Granulocytes 0 %   Abs Immature Granulocytes 0.04 0.00 - 0.07 K/uL    Comment: Performed at [facility], [address] [place], [place] [address]  Basic metabolic panel     Status: Abnormal   Collection Time: 12/13/20  9:33 PM  Result Value Ref Range   Sodium 131 (L) 135 - 145 mmol/L   Potassium 4.1 3.5 - 5.1 mmol/L   Chloride 94 (L) 98 - 111 mmol/L   CO2 22 22 - 32 mmol/L   Glucose, Bld 751 (HH) 70 - 99 mg/dL    Comment: CRITICAL RESULT CALLED TO, READ BACK BY AND VERIFIED WITH MELANIE FOWLER AT 2232 12/13/2020 DLB Glucose reference range applies only to samples taken after fasting for at least 8  hours.    BUN 34 (H) 8 - 23 mg/dL   Creatinine, Ser 2.02 (H) 0.61 - 1.24 mg/dL   Calcium 9.2 8.9 - 10.3 mg/dL   GFR, Estimated 32 (L) >60 mL/min    Comment: (NOTE) Calculated using the CKD-EPI Creatinine Equation (2021)    Anion gap 15 5 - 15    Comment: Performed at [facility], [address] [place], [place] [address]  Troponin I (High Sensitivity)     Status: Abnormal   Collection Time: 12/13/20  9:33 PM  Result Value Ref Range   Troponin I (High Sensitivity) 161 (HH) <18 ng/L    Comment: CRITICAL RESULT CALLED TO, READ BACK BY AND VERIFIED WITH MELANIE FOWLER AT 2232 12/13/2020 DLB (NOTE) Elevated high sensitivity troponin I (hsTnI) values and significant  changes across serial measurements may suggest ACS but many other  chronic and acute conditions are known to elevate hsTnI results.  Refer to the "Links" section for chest pain algorithms and additional  guidance. Performed at [facility], [address] [place], [place] [address]   TSH     Status: None   Collection Time: 12/13/20  9:33 PM  Result Value Ref Range   TSH 3.097 0.350 -  4.500 uIU/mL    Comment: Performed by a 3rd Generation assay with a functional sensitivity of <=0.01 uIU/mL. Performed at [facility], [address] [place], [place] [address]   T4, free     Status: Abnormal   Collection Time: 12/13/20  9:33 PM  Result Value Ref Range   Free T4 1.41 (H) 0.61 - 1.12 ng/dL    Comment: (NOTE) Biotin ingestion may interfere with free T4 tests. If the results are inconsistent with the TSH level, previous test results, or the clinical presentation, then consider biotin interference. If needed, order repeat testing after stopping biotin. Performed at [facility], [address] [place], [place] [address]   CBG monitoring, ED     Status: Abnormal   Collection Time: 12/13/20  9:52 PM  Result Value Ref Range   Glucose-Capillary >600 (HH) 70 - 99 mg/dL     Comment: Glucose reference range applies only to samples taken after fasting for at least 8 hours.   Comment 1 MD AWARE   Urinalysis, Complete w Microscopic Urine, Clean Catch     Status: Abnormal   Collection Time: 12/13/20  9:53 PM  Result Value Ref Range   Color, Urine STRAW (A) YELLOW   APPearance CLEAR (A) CLEAR   Specific Gravity, Urine 1.024 1.005 - 1.030   pH 5.0 5.0 - 8.0   Glucose, UA >=500 (A) NEGATIVE mg/dL   Hgb urine dipstick MODERATE (A) NEGATIVE   Bilirubin Urine NEGATIVE NEGATIVE   Ketones, ur 5 (A) NEGATIVE mg/dL   Protein, ur NEGATIVE NEGATIVE mg/dL   Nitrite NEGATIVE NEGATIVE   Leukocytes,Ua NEGATIVE NEGATIVE    Comment: Performed at [facility], [address] [place], [place] [address]  Blood gas, venous     Status: Abnormal   Collection Time: 12/13/20  9:54 PM  Result Value Ref Range   pH, Ven 7.41 7.250 - 7.430   pCO2, Ven 40 (L) 44.0 - 60.0 mmHg   pO2, Ven 40.0 32.0 - 45.0 mmHg   Bicarbonate 25.4 20.0 - 28.0 mmol/L   Acid-Base Excess 0.7 0.0 - 2.0 mmol/L   O2 Saturation 75.5 %   Patient temperature 37.0    Collection site VEIN    Sample type VENOUS     Comment: Performed at [facility], [address] , [place] [address]  Beta-hydroxybutyric acid     Status: Abnormal   Collection Time: 12/13/20  9:59 PM  Result Value Ref Range   Beta-Hydroxybutyric Acid 1.80 (H) 0.05 - 0.27 mmol/L    Comment: Performed at [facility], [place], [place] [address]  Resp Panel by RT-PCR (Flu A&B, Covid) Nasopharyngeal Swab     Status: None   Collection Time: 12/13/20 10:00 PM   Specimen: Nasopharyngeal Swab; Nasopharyngeal(NP) swabs in vial transport medium  Result Value Ref Range   SARS Coronavirus 2 by RT PCR NEGATIVE NEGATIVE    Comment: (NOTE) SARS-CoV-2 target nucleic acids are NOT DETECTED.  The SARS-CoV-2 RNA is generally detectable in upper respiratory specimens during the acute phase of infection. The  lowest concentration of SARS-CoV-2 viral copies this assay can detect is 138 copies/mL. A negative result does not preclude SARS-Cov-2 infection and should not be used as the sole basis for treatment or other patient management decisions. A negative result may occur with  improper specimen collection/handling, submission of specimen other than nasopharyngeal swab, presence of viral mutation(s) within the areas targeted by this assay, and inadequate number of  viral copies(<138 copies/mL). A negative result must be combined with clinical observations, patient history, and epidemiological information. The expected result is Negative.  Fact Sheet for Patients:  EntrepreneurPulse.com.au  Fact Sheet for Healthcare Providers:  IncredibleEmployment.be  This test is no t yet approved or cleared by the [place] FDA and  has been authorized for detection and/or diagnosis of SARS-CoV-2 by FDA under an Emergency Use Authorization (EUA). This EUA will remain  in effect (meaning this test can be used) for the duration of the COVID-19 declaration under Section 564(b)(1) of the Act, 21 U.S.C.section 360bbb-3(b)(1), unless the authorization is terminated  or revoked sooner.       Influenza A by PCR NEGATIVE NEGATIVE   Influenza B by PCR NEGATIVE NEGATIVE    Comment: (NOTE) The Xpert Xpress SARS-CoV-2/FLU/RSV plus assay is intended as an aid in the diagnosis of influenza from Nasopharyngeal swab specimens and should not be used as a sole basis for treatment. Nasal washings and aspirates are unacceptable for Xpert Xpress SARS-CoV-2/FLU/RSV testing.  Fact Sheet for Patients: EntrepreneurPulse.com.au  Fact Sheet for Healthcare Providers: IncredibleEmployment.be  This test is not yet approved or cleared by the [place] FDA and has been authorized for detection and/or diagnosis of SARS-CoV-2 by FDA under an Emergency  Use Authorization (EUA). This EUA will remain in effect (meaning this test can be used) for the duration of the COVID-19 declaration under Section 564(b)(1) of the Act, 21 U.S.C. section 360bbb-3(b)(1), unless the authorization is terminated or revoked.  Performed at [facility], [address] [place], [place] [address]   CBG monitoring, ED     Status: Abnormal   Collection Time: 12/14/20  1:53 AM  Result Value Ref Range   Glucose-Capillary >600 (HH) 70 - 99 mg/dL    Comment: Glucose reference range applies only to samples taken after fasting for at least 8 hours.  CBG monitoring, ED     Status: Abnormal   Collection Time: 12/14/20  5:38 AM  Result Value Ref Range   Glucose-Capillary 516 (HH) 70 - 99 mg/dL    Comment: Glucose reference range applies only to samples taken after fasting for at least 8 hours.   Comment 1 Call MD NNP PA CNM   Troponin I (High Sensitivity)     Status: Abnormal   Collection Time: 12/14/20  6:22 AM  Result Value Ref Range   Troponin I (High Sensitivity) 3,036 (HH) <18 ng/L    Comment: CRITICAL VALUE NOTED. VALUE IS CONSISTENT WITH PREVIOUSLY REPORTED/CALLED VALUE. QSD (NOTE) Elevated high sensitivity troponin I (hsTnI) values and significant  changes across serial measurements may suggest ACS but many other  chronic and acute conditions are known to elevate hsTnI results.  Refer to the "Links" section for chest pain algorithms and additional  guidance. Performed at [facility], [address] [place],  [address]   CBG monitoring, ED     Status: Abnormal   Collection Time: 12/14/20  7:44 AM  Result Value Ref Range   Glucose-Capillary 579 (HH) 70 - 99 mg/dL    Comment: Glucose reference range applies only to samples taken after fasting for at least 8 hours.   Comment 1 Notify RN    Comment 2 Document in Chart   Troponin I (High Sensitivity)     Status: Abnormal   Collection Time: 12/14/20  9:38 AM  Result Value Ref  Range   Troponin I (High Sensitivity) 3,039 (HH) <18 ng/L    Comment: CRITICAL VALUE NOTED.  VALUE IS CONSISTENT WITH PREVIOUSLY REPORTED/CALLED VALUE.PMF (NOTE) Elevated high sensitivity troponin I (hsTnI) values and significant  changes across serial measurements may suggest ACS but many other  chronic and acute conditions are known to elevate hsTnI results.  Refer to the "Links" section for chest pain algorithms and additional  guidance. Performed at [facility] [place], [address] [place], [place] [address]   Basic metabolic panel     Status: Abnormal   Collection Time: 12/14/20  9:38 AM  Result Value Ref Range   Sodium 139 135 - 145 mmol/L   Potassium 4.0 3.5 - 5.1 mmol/L   Chloride 104 98 - 111 mmol/L   CO2 26 22 - 32 mmol/L   Glucose, Bld 479 (H) 70 - 99 mg/dL    Comment: Glucose reference range applies only to samples taken after fasting for at least 8 hours.   BUN 26 (H) 8 - 23 mg/dL   Creatinine, Ser 1.58 (H) 0.61 - 1.24 mg/dL   Calcium 8.4 (L) 8.9 - 10.3 mg/dL   GFR, Estimated 43 (L) >60 mL/min    Comment: (NOTE) Calculated using the CKD-EPI Creatinine Equation (2021)    Anion gap 9 5 - 15    Comment: Performed at [facility], [address] [place], [place] [address]  CBC     Status: None   Collection Time: 12/14/20  9:38 AM  Result Value Ref Range   WBC 10.2 4.0 - 10.5 K/uL   RBC 4.82 4.22 - 5.81 MIL/uL   Hemoglobin 14.8 13.0 - 17.0 g/dL   HCT 43.1 39.0 - 52.0 %   MCV 89.4 80.0 - 100.0 fL   MCH 30.7 26.0 - 34.0 pg   MCHC 34.3 30.0 - 36.0 g/dL   RDW 13.0 11.5 - 15.5 %   Platelets 174 150 - 400 K/uL   nRBC 0.0 0.0 - 0.2 %    Comment: Performed at [facility], [address] [place], [place] [address]  Phosphorus     Status: None   Collection Time: 12/14/20  9:38 AM  Result Value Ref Range   Phosphorus 3.2 2.5 - 4.6 mg/dL    Comment: Performed at [facility], [address] [place], [place] [address]  Magnesium      Status: None   Collection Time: 12/14/20  9:38 AM  Result Value Ref Range   Magnesium 1.9 1.7 - 2.4 mg/dL    Comment: Performed at [facility], [address] [place],  [address]  ECHOCARDIOGRAM COMPLETE     Status: None   Collection Time: 12/14/20  9:50 AM  Result Value Ref Range   Weight 2,968 oz   Height 67 in   BP 167/98 mmHg   Ao pk vel 0.99 m/s   AV Area VTI 2.25 cm2   AR max vel 1.96 cm2   AV Mean grad 2.0 mmHg   AV Peak grad 3.9 mmHg   Single Plane A2C EF 45.0 %   Single Plane A4C EF 38.3 %   Calc EF 42.5 %   S' Lateral 4.50 cm   AV Area mean vel 1.75 cm2   Area-P 1/2 3.19 cm2   MV VTI 2.44 cm2  CBG monitoring, ED     Status: Abnormal   Collection Time: 12/14/20 10:50 AM  Result Value Ref Range   Glucose-Capillary 357 (H) 70 - 99 mg/dL    Comment: Glucose reference range applies only to samples taken after fasting for at least 8  hours.  CBG monitoring, ED     Status: Abnormal   Collection Time: 12/14/20 11:35 AM  Result Value Ref Range   Glucose-Capillary 375 (H) 70 - 99 mg/dL    Comment: Glucose reference range applies only to samples taken after fasting for at least 8 hours.  Troponin I (High Sensitivity)     Status: Abnormal   Collection Time: 12/14/20 11:37 AM  Result Value Ref Range   Troponin I (High Sensitivity) 2,769 (HH) <18 ng/L    Comment: CRITICAL VALUE NOTED. VALUE IS CONSISTENT WITH PREVIOUSLY REPORTED/CALLED VALUE. QSD (NOTE) Elevated high sensitivity troponin I (hsTnI) values and significant  changes across serial measurements may suggest ACS but many other  chronic and acute conditions are known to elevate hsTnI results.  Refer to the "Links" section for chest pain algorithms and additional  guidance. Performed at [facility], [address] [place], [place] [address]   CBG monitoring, ED     Status: Abnormal   Collection Time: 12/14/20  5:01 PM  Result Value Ref Range   Glucose-Capillary 152 (H) 70 - 99 mg/dL     Comment: Glucose reference range applies only to samples taken after fasting for at least 8 hours.  CBC     Status: Abnormal   Collection Time: 12/15/20  5:59 AM  Result Value Ref Range   WBC 11.4 (H) 4.0 - 10.5 K/uL   RBC 4.67 4.22 - 5.81 MIL/uL   Hemoglobin 14.2 13.0 - 17.0 g/dL   HCT 40.7 39.0 - 52.0 %   MCV 87.2 80.0 - 100.0 fL   MCH 30.4 26.0 - 34.0 pg   MCHC 34.9 30.0 - 36.0 g/dL   RDW 13.2 11.5 - 15.5 %   Platelets 202 150 - 400 K/uL   nRBC 0.0 0.0 - 0.2 %    Comment: Performed at [facility], [address], [place], [place] [address]  Basic metabolic panel     Status: Abnormal   Collection Time: 12/15/20  5:59 AM  Result Value Ref Range   Sodium 137 135 - 145 mmol/L   Potassium 3.3 (L) 3.5 - 5.1 mmol/L   Chloride 101 98 - 111 mmol/L   CO2 28 22 - 32 mmol/L   Glucose, Bld 173 (H) 70 - 99 mg/dL    Comment: Glucose reference range applies only to samples taken after fasting for at least 8 hours.   BUN 25 (H) 8 - 23 mg/dL   Creatinine, Ser 1.38 (H) 0.61 - 1.24 mg/dL   Calcium 8.2 (L) 8.9 - 10.3 mg/dL   GFR, Estimated 51 (L) >60 mL/min    Comment: (NOTE) Calculated using the CKD-EPI Creatinine Equation (2021)    Anion gap 8 5 - 15    Comment: Performed at [facility], [address] [place], [place] [address]  Magnesium     Status: Abnormal   Collection Time: 12/15/20  5:59 AM  Result Value Ref Range   Magnesium 1.6 (L) 1.7 - 2.4 mg/dL    Comment: Performed at [facility], [address] [place], [place] [address]  Phosphorus     Status: None   Collection Time: 12/15/20  5:59 AM  Result Value Ref Range   Phosphorus 2.9 2.5 - 4.6 mg/dL    Comment: Performed at [facility], [address], [place], [place] [address]  CBG monitoring, ED     Status: Abnormal   Collection Time: 12/15/20  8:49 AM  Result Value Ref Range  Glucose-Capillary 138 (H) 70 - 99 mg/dL    Comment: Glucose reference range applies only to samples  taken after fasting for at least 8 hours.    Radiology No results found.  Assessment/Plan Essential hypertension blood pressure control important in reducing the progression of atherosclerotic disease. On appropriate oral medications.     Type 2 diabetes mellitus treated with insulin (HCC) blood glucose control important in reducing the progression of atherosclerotic disease. Also, involved in wound healing. On appropriate medications.   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 Well healed and with prosthesis. 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  Any errors from dictation are purely unintentional

## 2021-03-06 ENCOUNTER — Encounter: Payer: Self-pay | Admitting: Emergency Medicine

## 2021-03-06 ENCOUNTER — Inpatient Hospital Stay
Admission: EM | Admit: 2021-03-06 | Discharge: 2021-03-11 | DRG: 871 | Disposition: A | Discharge status: Skilled Nursing Facility | Payer: Medicare Other | Attending: Internal Medicine | Admitting: Internal Medicine

## 2021-03-06 ENCOUNTER — Emergency Department: Payer: Medicare Other

## 2021-03-06 ENCOUNTER — Inpatient Hospital Stay: Payer: Medicare Other

## 2021-03-06 ENCOUNTER — Other Ambulatory Visit: Payer: Self-pay

## 2021-03-06 DIAGNOSIS — I739 Peripheral vascular disease, unspecified: Secondary | ICD-10-CM | POA: Diagnosis present

## 2021-03-06 DIAGNOSIS — G8929 Other chronic pain: Secondary | ICD-10-CM | POA: Diagnosis present

## 2021-03-06 DIAGNOSIS — M1990 Unspecified osteoarthritis, unspecified site: Secondary | ICD-10-CM | POA: Diagnosis present

## 2021-03-06 DIAGNOSIS — Z8546 Personal history of malignant neoplasm of prostate: Secondary | ICD-10-CM | POA: Diagnosis not present

## 2021-03-06 DIAGNOSIS — Z8249 Family history of ischemic heart disease and other diseases of the circulatory system: Secondary | ICD-10-CM

## 2021-03-06 DIAGNOSIS — I441 Atrioventricular block, second degree: Secondary | ICD-10-CM

## 2021-03-06 DIAGNOSIS — Z79899 Other long term (current) drug therapy: Secondary | ICD-10-CM

## 2021-03-06 DIAGNOSIS — Z794 Long term (current) use of insulin: Secondary | ICD-10-CM | POA: Diagnosis not present

## 2021-03-06 DIAGNOSIS — R4182 Altered mental status, unspecified: Secondary | ICD-10-CM

## 2021-03-06 DIAGNOSIS — E860 Dehydration: Secondary | ICD-10-CM | POA: Diagnosis present

## 2021-03-06 DIAGNOSIS — E876 Hypokalemia: Secondary | ICD-10-CM | POA: Diagnosis not present

## 2021-03-06 DIAGNOSIS — Z79891 Long term (current) use of opiate analgesic: Secondary | ICD-10-CM

## 2021-03-06 DIAGNOSIS — I130 Hypertensive heart and chronic kidney disease with heart failure and stage 1 through stage 4 chronic kidney disease, or unspecified chronic kidney disease: Secondary | ICD-10-CM | POA: Diagnosis present

## 2021-03-06 DIAGNOSIS — E1122 Type 2 diabetes mellitus with diabetic chronic kidney disease: Secondary | ICD-10-CM | POA: Diagnosis present

## 2021-03-06 DIAGNOSIS — G928 Other toxic encephalopathy: Secondary | ICD-10-CM | POA: Diagnosis present

## 2021-03-06 DIAGNOSIS — R001 Bradycardia, unspecified: Secondary | ICD-10-CM

## 2021-03-06 DIAGNOSIS — Z833 Family history of diabetes mellitus: Secondary | ICD-10-CM | POA: Diagnosis not present

## 2021-03-06 DIAGNOSIS — G40909 Epilepsy, unspecified, not intractable, without status epilepticus: Secondary | ICD-10-CM | POA: Diagnosis present

## 2021-03-06 DIAGNOSIS — E1151 Type 2 diabetes mellitus with diabetic peripheral angiopathy without gangrene: Secondary | ICD-10-CM | POA: Diagnosis present

## 2021-03-06 DIAGNOSIS — K219 Gastro-esophageal reflux disease without esophagitis: Secondary | ICD-10-CM | POA: Diagnosis present

## 2021-03-06 DIAGNOSIS — N179 Acute kidney failure, unspecified: Secondary | ICD-10-CM | POA: Diagnosis present

## 2021-03-06 DIAGNOSIS — Z91A3 Caregiver's unintentional underdosing of patient's medication regimen: Secondary | ICD-10-CM

## 2021-03-06 DIAGNOSIS — I5042 Chronic combined systolic (congestive) and diastolic (congestive) heart failure: Secondary | ICD-10-CM | POA: Diagnosis present

## 2021-03-06 DIAGNOSIS — E1165 Type 2 diabetes mellitus with hyperglycemia: Secondary | ICD-10-CM | POA: Diagnosis present

## 2021-03-06 DIAGNOSIS — R739 Hyperglycemia, unspecified: Secondary | ICD-10-CM

## 2021-03-06 DIAGNOSIS — I480 Paroxysmal atrial fibrillation: Secondary | ICD-10-CM | POA: Diagnosis present

## 2021-03-06 DIAGNOSIS — Z20822 Contact with and (suspected) exposure to covid-19: Secondary | ICD-10-CM | POA: Diagnosis present

## 2021-03-06 DIAGNOSIS — Z7901 Long term (current) use of anticoagulants: Secondary | ICD-10-CM

## 2021-03-06 DIAGNOSIS — I2510 Atherosclerotic heart disease of native coronary artery without angina pectoris: Secondary | ICD-10-CM | POA: Diagnosis present

## 2021-03-06 DIAGNOSIS — G4733 Obstructive sleep apnea (adult) (pediatric): Secondary | ICD-10-CM | POA: Diagnosis present

## 2021-03-06 DIAGNOSIS — Z66 Do not resuscitate: Secondary | ICD-10-CM | POA: Diagnosis present

## 2021-03-06 DIAGNOSIS — Z89512 Acquired absence of left leg below knee: Secondary | ICD-10-CM

## 2021-03-06 DIAGNOSIS — I5022 Chronic systolic (congestive) heart failure: Secondary | ICD-10-CM

## 2021-03-06 DIAGNOSIS — A419 Sepsis, unspecified organism: Secondary | ICD-10-CM | POA: Diagnosis present

## 2021-03-06 DIAGNOSIS — N1832 Chronic kidney disease, stage 3b: Secondary | ICD-10-CM | POA: Diagnosis present

## 2021-03-06 DIAGNOSIS — Z89511 Acquired absence of right leg below knee: Secondary | ICD-10-CM

## 2021-03-06 DIAGNOSIS — E872 Acidosis, unspecified: Secondary | ICD-10-CM | POA: Diagnosis present

## 2021-03-06 DIAGNOSIS — G9341 Metabolic encephalopathy: Secondary | ICD-10-CM

## 2021-03-06 DIAGNOSIS — Z7982 Long term (current) use of aspirin: Secondary | ICD-10-CM

## 2021-03-06 DIAGNOSIS — N1831 Chronic kidney disease, stage 3a: Secondary | ICD-10-CM | POA: Diagnosis present

## 2021-03-06 DIAGNOSIS — Z7984 Long term (current) use of oral hypoglycemic drugs: Secondary | ICD-10-CM

## 2021-03-06 DIAGNOSIS — R778 Other specified abnormalities of plasma proteins: Secondary | ICD-10-CM | POA: Diagnosis present

## 2021-03-06 LAB — BRAIN NATRIURETIC PEPTIDE: B Natriuretic Peptide: 962.3 pg/mL — ABNORMAL HIGH (ref 0.0–100.0)

## 2021-03-06 LAB — BASIC METABOLIC PANEL
Anion gap: 13 (ref 5–15)
Anion gap: 11 (ref 5–15)
BUN: 25 mg/dL — ABNORMAL HIGH (ref 8–23)
BUN: 24 mg/dL — ABNORMAL HIGH (ref 8–23)
CO2: 24 mmol/L (ref 22–32)
CO2: 28 mmol/L (ref 22–32)
Calcium: 8.6 mg/dL — ABNORMAL LOW (ref 8.9–10.3)
Calcium: 9 mg/dL (ref 8.9–10.3)
Chloride: 97 mmol/L — ABNORMAL LOW (ref 98–111)
Chloride: 99 mmol/L (ref 98–111)
Creatinine, Ser: 1.45 mg/dL — ABNORMAL HIGH (ref 0.61–1.24)
Creatinine, Ser: 1.48 mg/dL — ABNORMAL HIGH (ref 0.61–1.24)
GFR, Estimated: 48 mL/min — ABNORMAL LOW (ref >60)
GFR, Estimated: 47 mL/min — ABNORMAL LOW (ref >60)
Glucose, Bld: 419 mg/dL — ABNORMAL HIGH (ref 70–99)
Glucose, Bld: 85 mg/dL (ref 70–99)
Potassium: 3.7 mmol/L (ref 3.5–5.1)
Potassium: 2.9 mmol/L — ABNORMAL LOW (ref 3.5–5.1)
Sodium: 134 mmol/L — ABNORMAL LOW (ref 135–145)
Sodium: 138 mmol/L (ref 135–145)

## 2021-03-06 LAB — URINALYSIS, COMPLETE (UACMP) WITH MICROSCOPIC
Bacteria, UA: NONE SEEN
Bilirubin Urine: NEGATIVE
Glucose, UA: >=500 mg/dL — AB
Hgb urine dipstick: NEGATIVE
Ketones, ur: 5 mg/dL — AB
Leukocytes,Ua: NEGATIVE
Nitrite: NEGATIVE
Protein, ur: 30 mg/dL — AB
Specific Gravity, Urine: 1.026 (ref 1.005–1.030)
Squamous Epithelial / HPF: NONE SEEN (ref 0–5)
pH: 5 (ref 5.0–8.0)

## 2021-03-06 LAB — LACTIC ACID, PLASMA
Lactic Acid, Venous: 2 mmol/L (ref 0.5–1.9)
Lactic Acid, Venous: 1.6 mmol/L (ref 0.5–1.9)

## 2021-03-06 LAB — MAGNESIUM: Magnesium: 1.5 mg/dL — ABNORMAL LOW (ref 1.7–2.4)

## 2021-03-06 LAB — CBC WITH DIFFERENTIAL/PLATELET
Abs Immature Granulocytes: 0.03 10*3/uL (ref 0.00–0.07)
Basophils Absolute: 0.1 10*3/uL (ref 0.0–0.1)
Basophils Relative: 1 %
Eosinophils Absolute: 0.4 10*3/uL (ref 0.0–0.5)
Eosinophils Relative: 5 %
HCT: 40.3 % (ref 39.0–52.0)
Hemoglobin: 14.2 g/dL (ref 13.0–17.0)
Immature Granulocytes: 0 %
Lymphocytes Relative: 21 %
Lymphs Abs: 1.9 10*3/uL (ref 0.7–4.0)
MCH: 30.2 pg (ref 26.0–34.0)
MCHC: 35.2 g/dL (ref 30.0–36.0)
MCV: 85.7 fL (ref 80.0–100.0)
Monocytes Absolute: 0.8 10*3/uL (ref 0.1–1.0)
Monocytes Relative: 9 %
Neutro Abs: 5.7 10*3/uL (ref 1.7–7.7)
Neutrophils Relative %: 64 %
Platelets: 202 10*3/uL (ref 150–400)
RBC: 4.7 MIL/uL (ref 4.22–5.81)
RDW: 13.1 % (ref 11.5–15.5)
WBC: 8.9 10*3/uL (ref 4.0–10.5)
nRBC: 0 % (ref 0.0–0.2)

## 2021-03-06 LAB — COMPREHENSIVE METABOLIC PANEL
ALT: 19 U/L (ref 0–44)
AST: 22 U/L (ref 15–41)
Albumin: 3.5 g/dL (ref 3.5–5.0)
Alkaline Phosphatase: 104 U/L (ref 38–126)
Anion gap: 12 (ref 5–15)
BUN: 31 mg/dL — ABNORMAL HIGH (ref 8–23)
CO2: 25 mmol/L (ref 22–32)
Calcium: 8.9 mg/dL (ref 8.9–10.3)
Chloride: 95 mmol/L — ABNORMAL LOW (ref 98–111)
Creatinine, Ser: 1.91 mg/dL — ABNORMAL HIGH (ref 0.61–1.24)
GFR, Estimated: 34 mL/min — ABNORMAL LOW (ref >60)
Glucose, Bld: 499 mg/dL — ABNORMAL HIGH (ref 70–99)
Potassium: 4.1 mmol/L (ref 3.5–5.1)
Sodium: 132 mmol/L — ABNORMAL LOW (ref 135–145)
Total Bilirubin: 1 mg/dL (ref 0.3–1.2)
Total Protein: 6.7 g/dL (ref 6.5–8.1)

## 2021-03-06 LAB — RESP PANEL BY RT-PCR (FLU A&B, COVID) ARPGX2
Influenza A by PCR: NEGATIVE
Influenza B by PCR: NEGATIVE
SARS Coronavirus 2 by RT PCR: NEGATIVE

## 2021-03-06 LAB — TROPONIN I (HIGH SENSITIVITY)
Troponin I (High Sensitivity): 41 ng/L — ABNORMAL HIGH (ref <18)
Troponin I (High Sensitivity): 39 ng/L — ABNORMAL HIGH (ref <18)

## 2021-03-06 LAB — PROCALCITONIN: Procalcitonin: <0.1 ng/mL

## 2021-03-06 LAB — CBG MONITORING, ED
Glucose-Capillary: 139 mg/dL — ABNORMAL HIGH (ref 70–99)
Glucose-Capillary: 375 mg/dL — ABNORMAL HIGH (ref 70–99)
Glucose-Capillary: 399 mg/dL — ABNORMAL HIGH (ref 70–99)
Glucose-Capillary: 467 mg/dL — ABNORMAL HIGH (ref 70–99)

## 2021-03-06 LAB — HEMOGLOBIN A1C
Hgb A1c MFr Bld: 12.4 % — ABNORMAL HIGH (ref 4.8–5.6)
Mean Plasma Glucose: 309 mg/dL

## 2021-03-06 LAB — VALPROIC ACID LEVEL: Valproic Acid Lvl: 33 ug/mL — ABNORMAL LOW (ref 50.0–100.0)

## 2021-03-06 MED ORDER — VANCOMYCIN HCL IN DEXTROSE 1-5 GM/200ML-% IV SOLN: 1000.0000 mg | Freq: Once | INTRAVENOUS | Status: DC

## 2021-03-06 MED ORDER — CHLORTHALIDONE 25 MG PO TABS: 25.0000 mg | ORAL_TABLET | Freq: Every day | ORAL | Status: DC

## 2021-03-06 MED ORDER — ACETAMINOPHEN 650 MG RE SUPP
650.0000 mg | Freq: Four times a day (QID) | RECTAL | Status: DC | PRN
  Filled 2021-03-06: qty 1

## 2021-03-06 MED ORDER — INSULIN ASPART 100 UNIT/ML IJ SOLN
6.0000 [IU] | Freq: Three times a day (TID) | INTRAMUSCULAR | Status: DC
  Administered 2021-03-06 – 2021-03-07 (×4): 6 [IU] via SUBCUTANEOUS
  Filled 2021-03-06 (×4): qty 1

## 2021-03-06 MED ORDER — VANCOMYCIN HCL 2000 MG/400ML IV SOLN
2000.0000 mg | Freq: Once | INTRAVENOUS | Status: AC
  Administered 2021-03-06: 2000 mg via INTRAVENOUS
  Filled 2021-03-06: qty 400

## 2021-03-06 MED ORDER — MAGNESIUM SULFATE 4 GM/100ML IV SOLN
4.0000 g | Freq: Once | INTRAVENOUS | Status: AC
  Administered 2021-03-06: 4 g via INTRAVENOUS
  Filled 2021-03-06: qty 100

## 2021-03-06 MED ORDER — HYDRALAZINE HCL 20 MG/ML IJ SOLN: 10.0000 mg | INTRAMUSCULAR | Status: DC | PRN

## 2021-03-06 MED ORDER — HALOPERIDOL 0.5 MG PO TABS
0.5000 mg | ORAL_TABLET | Freq: Three times a day (TID) | ORAL | Status: DC | PRN
  Administered 2021-03-06: 0.5 mg via ORAL
  Filled 2021-03-06 (×3): qty 1

## 2021-03-06 MED ORDER — ONDANSETRON HCL 4 MG/2ML IJ SOLN: 4.0000 mg | Freq: Four times a day (QID) | INTRAMUSCULAR | Status: DC | PRN

## 2021-03-06 MED ORDER — ASPIRIN EC 81 MG PO TBEC
81.0000 mg | DELAYED_RELEASE_TABLET | Freq: Every day | ORAL | Status: DC
  Administered 2021-03-06: 81 mg via ORAL
  Filled 2021-03-06 (×2): qty 1

## 2021-03-06 MED ORDER — INSULIN ASPART 100 UNIT/ML IJ SOLN
0.0000 [IU] | Freq: Every day | INTRAMUSCULAR | Status: DC
  Administered 2021-03-08: 2 [IU] via SUBCUTANEOUS
  Filled 2021-03-06 (×2): qty 1

## 2021-03-06 MED ORDER — SODIUM CHLORIDE 0.9 % IV BOLUS
1000.0000 mL | Freq: Once | INTRAVENOUS | Status: AC
  Administered 2021-03-06: 1000 mL via INTRAVENOUS

## 2021-03-06 MED ORDER — POTASSIUM CHLORIDE 10 MEQ/100ML IV SOLN
10.0000 meq | INTRAVENOUS | Status: AC
  Administered 2021-03-06 – 2021-03-07 (×4): 10 meq via INTRAVENOUS
  Filled 2021-03-06 (×3): qty 100

## 2021-03-06 MED ORDER — ISOSORBIDE MONONITRATE ER 60 MG PO TB24
120.0000 mg | ORAL_TABLET | Freq: Every day | ORAL | Status: DC
  Administered 2021-03-06 – 2021-03-11 (×5): 120 mg via ORAL
  Filled 2021-03-06 (×6): qty 2

## 2021-03-06 MED ORDER — SODIUM CHLORIDE 0.9 % IV SOLN
2.0000 g | Freq: Once | INTRAVENOUS | Status: AC
  Administered 2021-03-06: 2 g via INTRAVENOUS
  Filled 2021-03-06: qty 2

## 2021-03-06 MED ORDER — INSULIN ASPART 100 UNIT/ML IJ SOLN
0.0000 [IU] | Freq: Three times a day (TID) | INTRAMUSCULAR | Status: DC
  Administered 2021-03-06 (×2): 15 [IU] via SUBCUTANEOUS
  Administered 2021-03-06: 2 [IU] via SUBCUTANEOUS
  Administered 2021-03-07: 8 [IU] via SUBCUTANEOUS
  Administered 2021-03-07 – 2021-03-08 (×2): 3 [IU] via SUBCUTANEOUS
  Administered 2021-03-09: 11 [IU] via SUBCUTANEOUS
  Administered 2021-03-09 – 2021-03-10 (×2): 8 [IU] via SUBCUTANEOUS
  Administered 2021-03-10: 5 [IU] via SUBCUTANEOUS
  Administered 2021-03-10 – 2021-03-11 (×2): 3 [IU] via SUBCUTANEOUS
  Administered 2021-03-11: 8 [IU] via SUBCUTANEOUS
  Filled 2021-03-06 (×13): qty 1

## 2021-03-06 MED ORDER — ACETAMINOPHEN 325 MG PO TABS
650.0000 mg | ORAL_TABLET | Freq: Four times a day (QID) | ORAL | Status: DC | PRN
  Administered 2021-03-07 – 2021-03-09 (×3): 650 mg via ORAL
  Filled 2021-03-06 (×3): qty 2

## 2021-03-06 MED ORDER — SODIUM CHLORIDE 0.9 % IV BOLUS (SEPSIS)
1000.0000 mL | Freq: Once | INTRAVENOUS | Status: AC
  Administered 2021-03-06: 1000 mL via INTRAVENOUS

## 2021-03-06 MED ORDER — METOPROLOL TARTRATE 5 MG/5ML IV SOLN
5.0000 mg | Freq: Once | INTRAVENOUS | Status: AC
  Administered 2021-03-06: 5 mg via INTRAVENOUS
  Filled 2021-03-06: qty 5

## 2021-03-06 MED ORDER — LORAZEPAM 2 MG/ML IJ SOLN
1.0000 mg | Freq: Once | INTRAMUSCULAR | Status: DC | PRN
Start: 2021-03-06 — End: 2021-03-06

## 2021-03-06 MED ORDER — HALOPERIDOL 2 MG PO TABS
2.0000 mg | ORAL_TABLET | Freq: Four times a day (QID) | ORAL | Status: DC | PRN
  Administered 2021-03-06: 2 mg via ORAL
  Filled 2021-03-06 (×3): qty 1

## 2021-03-06 MED ORDER — ONDANSETRON HCL 4 MG PO TABS: 4.0000 mg | ORAL_TABLET | Freq: Four times a day (QID) | ORAL | Status: DC | PRN

## 2021-03-06 MED ORDER — METRONIDAZOLE 500 MG/100ML IV SOLN
500.0000 mg | Freq: Once | INTRAVENOUS | Status: AC
  Administered 2021-03-06: 500 mg via INTRAVENOUS
  Filled 2021-03-06: qty 100

## 2021-03-06 MED ORDER — POTASSIUM CHLORIDE CRYS ER 20 MEQ PO TBCR
40.0000 meq | EXTENDED_RELEASE_TABLET | Freq: Once | ORAL | Status: AC
  Administered 2021-03-06: 40 meq via ORAL
  Filled 2021-03-06: qty 2

## 2021-03-06 MED ORDER — DIVALPROEX SODIUM 500 MG PO DR TAB
500.0000 mg | DELAYED_RELEASE_TABLET | Freq: Every day | ORAL | Status: DC
  Administered 2021-03-06: 500 mg via ORAL
  Filled 2021-03-06 (×2): qty 1

## 2021-03-06 MED ORDER — ATORVASTATIN CALCIUM 80 MG PO TABS
80.0000 mg | ORAL_TABLET | Freq: Every day | ORAL | Status: DC
  Administered 2021-03-06 – 2021-03-10 (×5): 80 mg via ORAL
  Filled 2021-03-06: qty 1
  Filled 2021-03-06: qty 4
  Filled 2021-03-06 (×4): qty 1

## 2021-03-06 MED ORDER — RIVAROXABAN 15 MG PO TABS
15.0000 mg | ORAL_TABLET | Freq: Every day | ORAL | Status: DC
  Administered 2021-03-06 – 2021-03-11 (×6): 15 mg via ORAL
  Filled 2021-03-06 (×6): qty 1

## 2021-03-06 MED ORDER — INSULIN GLARGINE-YFGN 100 UNIT/ML ~~LOC~~ SOLN
20.0000 [IU] | Freq: Once | SUBCUTANEOUS | Status: AC
  Administered 2021-03-06: 20 [IU] via SUBCUTANEOUS
  Filled 2021-03-06: qty 0.2

## 2021-03-06 MED ORDER — INSULIN GLARGINE-YFGN 100 UNIT/ML ~~LOC~~ SOLN
20.0000 [IU] | Freq: Two times a day (BID) | SUBCUTANEOUS | Status: DC
  Administered 2021-03-06 – 2021-03-07 (×4): 20 [IU] via SUBCUTANEOUS
  Filled 2021-03-06 (×6): qty 0.2

## 2021-03-06 NOTE — ED Notes (Signed)
Wouk MD Made aware of pts CBG of 399

## 2021-03-06 NOTE — Progress Notes (Addendum)
PROGRESS NOTE    [name]  ATF:[id number] DOB: 1935/02/04 DOA: 03/06/2021 PCP: Corey Skains, MD  Outpatient Specialists: cardiology, vascular surgery    Brief Narrative:   From admission hpi: [name] is a 86 y.o. male with medical history significant for CAD, HTN, PAD s/p bilateral BKA, paroxysmal A. fib on Xarelto, systolic heart failure (EF 35 to 40% 12/14/2020), CKD 3A, seizure disorder, chronic pain on chronic opiates presenting by EMS with altered mental status and blood sugar readings in the 500s.  History is limited due to altered mental status and is taken mostly from ED provider.   ED course: On arrival, bradycardic 54-59 with otherwise normal vitals Blood work WBC 8.9, lactic acid 2.0> 1.6.  Blood glucose 499, creatinine 1.91 above baseline of 1.38, troponin 41.  Urinalysis unremarkable, COVID and flu negative   Assessment & Plan:   Principal Problem:   Acute toxic-metabolic encephalopathy Active Problems:   PAD (peripheral artery disease) (HCC)   PAF (paroxysmal atrial fibrillation) (HCC)   CAD (coronary artery disease)   Chronic kidney disease, stage 3a (HCC)   Lactic acidosis   Chronic prescription opiate use   Elevated troponin   Hyperglycemia due to type 2 diabetes mellitus (HCC)   Chronic systolic CHF (congestive heart failure) ([place])  # Delirium # Lactic acidosis Wife reports that patient's baseline is waxing and waning confusion, she says his current confusion is a bit off from the normal waxing and waning of his confusion. No signs infection, no electrolyte derangements. Is hyperglycemic. Presented with mildly elevated lactate, likely 2/2 dehydration. No report of seizure-like activity but could be post-ictal, particularly given possible poor adherence to meds. Repeat lactate wnl - delirium precautions - will check MRI brain - f/u procalcitonin  # T2DM # Hyperglycemia Wife says she and husband are responsible for his meds  but they are frequently confused, he doesn't get insulin as prescribed - resume home lantus 20 bid, SSI  # CAD Trops mildly elevated and flat, do not think acs. Cath in 2021 showed stable multi-vessel disease - cont home statin, aspirin, imdur  # HFrEF Recent ef 35-40%. Appears euvolemic - cont home imdur - hold home metop and dilt given bradycardia, would resume metop first when hr improves  # Bradycardia Asymptomatic - holding dilt and metop as above  # AKI on ckd 3b Baseline cr 1.5, here 1.9, likely 2/2 dehydration from hyperglycemia. Received 3 L in the ED - repeat cr pending  # HTN Here bp elevated - resume home imdur - restart chlorthalidone when kidney  function improves - prn hydral - holding dilt and metop as above  # A-fib Chronic - eliquis as above - holding metop and dilt as above  # Seizure disorder - home depakote - f/u valproic acid level, concern for not taking meds as prescribed  # PAD # b/l BKA - asa, statin, xarelto   DVT prophylaxis: xarelto Code Status: DNR, confirmed w/ daughter Family Communication: wife updated telephonically 10/14, daughter on 10/14  Level of care: Progressive Cardiac Status is: Inpatient  Remains inpatient appropriate because: severity of illness        Consultants:  none  Procedures: none  Antimicrobials:  S/p cefepime    Subjective: Confused, alert, no pain  Objective: Vitals:   03/06/21 0300 03/06/21 0330 03/06/21 0500 03/06/21 0607  BP: (!) 152/64 (!) 143/93 (!) 107/95 (!) 177/83  Pulse: (!) 59 60 (!) 53 61  Resp: 17 15 17 18   Temp:  SpO2: 94% 98% 93% 98%  Weight:      Height:        Intake/Output Summary (Last 24 hours) at 03/06/2021 0823 Last data filed at 03/06/2021 0432 Gross per 24 hour  Intake 400 ml  Output --  Net 400 ml   Filed Weights   03/06/21 0030  Weight: 84.1 kg    Examination:  General exam: mildly agitated Respiratory system: Clear to auscultation.  Respiratory effort normal. Cardiovascular system: S1 & S2 heard, RRR. No JVD, murmurs, rubs, gallops or clicks. No pedal edema. Gastrointestinal system: Abdomen is obese, soft and nontender. No organomegaly or masses felt. Normal bowel sounds heard. Central nervous system: Alert, not oriented, moving all 4 estremities Extremities: Symmetric 5 x 5 power. Skin: No rashes, lesions or ulcers Psychiatry: confused, redirectable    Data Reviewed: I have personally reviewed following labs and imaging studies  CBC: Recent Labs  Lab 03/06/21 0039  WBC 8.9  NEUTROABS 5.7  HGB 14.2  HCT 40.3  MCV 85.7  PLT 938   Basic Metabolic Panel: Recent Labs  Lab 03/06/21 0039  NA 132*  K 4.1  CL 95*  CO2 25  GLUCOSE 499*  BUN 31*  CREATININE 1.91*  CALCIUM 8.9   GFR: Estimated Creatinine Clearance: 30.4 mL/min (A) (by C-G formula based on SCr of 1.91 mg/dL (H)). Liver Function Tests: Recent Labs  Lab 03/06/21 0039  AST 22  ALT 19  ALKPHOS 104  BILITOT 1.0  PROT 6.7  ALBUMIN 3.5   No results for input(s): LIPASE, AMYLASE in the last 168 hours. No results for input(s): AMMONIA in the last 168 hours. Coagulation Profile: No results for input(s): INR, PROTIME in the last 168 hours. Cardiac Enzymes: No results for input(s): CKTOTAL, CKMB, CKMBINDEX, TROPONINI in the last 168 hours. BNP (last 3 results) No results for input(s): PROBNP in the last 8760 hours. HbA1C: No results for input(s): HGBA1C in the last 72 hours. CBG: Recent Labs  Lab 03/06/21 0038  GLUCAP 467*   Lipid Profile: No results for input(s): CHOL, HDL, LDLCALC, TRIG, CHOLHDL, LDLDIRECT in the last 72 hours. Thyroid Function Tests: No results for input(s): TSH, T4TOTAL, FREET4, T3FREE, THYROIDAB in the last 72 hours. Anemia Panel: No results for input(s): VITAMINB12, FOLATE, FERRITIN, TIBC, IRON, RETICCTPCT in the last 72 hours. Urine analysis:    Component Value Date/Time   COLORURINE YELLOW (A) 03/06/2021  0055   APPEARANCEUR HAZY (A) 03/06/2021 0055   LABSPEC 1.026 03/06/2021 0055   PHURINE 5.0 03/06/2021 0055   GLUCOSEU >=500 (A) 03/06/2021 0055   HGBUR NEGATIVE 03/06/2021 0055   BILIRUBINUR NEGATIVE 03/06/2021 0055   KETONESUR 5 (A) 03/06/2021 0055   PROTEINUR 30 (A) 03/06/2021 0055   NITRITE NEGATIVE 03/06/2021 0055   LEUKOCYTESUR NEGATIVE 03/06/2021 0055   Sepsis Labs: @LABRCNTIP (procalcitonin:4,lacticidven:4)  ) Recent Results (from the past 240 hour(s))  Resp Panel by RT-PCR (Flu A&B, Covid) Nasopharyngeal Swab     Status: None   Collection Time: 03/06/21 12:55 AM   Specimen: Nasopharyngeal Swab; Nasopharyngeal(NP) swabs in vial transport medium  Result Value Ref Range Status   SARS Coronavirus 2 by RT PCR NEGATIVE NEGATIVE Final    Comment: (NOTE) SARS-CoV-2 target nucleic acids are NOT DETECTED.  The SARS-CoV-2 RNA is generally detectable in upper respiratory specimens during the acute phase of infection. The lowest concentration of SARS-CoV-2 viral copies this assay can detect is 138 copies/mL. A negative result does not preclude SARS-Cov-2 infection and should not be used as  the sole basis for treatment or other patient management decisions. A negative result may occur with  improper specimen collection/handling, submission of specimen other than nasopharyngeal swab, presence of viral mutation(s) within the areas targeted by this assay, and inadequate number of viral copies(<138 copies/mL). A negative result must be combined with clinical observations, patient history, and epidemiological information. The expected result is Negative.  Fact Sheet for Patients:  EntrepreneurPulse.com.au  Fact Sheet for Healthcare Providers:  IncredibleEmployment.be  This test is no t yet approved or cleared by the [place] FDA and  has been authorized for detection and/or diagnosis of SARS-CoV-2 by FDA under an Emergency Use Authorization  (EUA). This EUA will remain  in effect (meaning this test can be used) for the duration of the COVID-19 declaration under Section 564(b)(1) of the Act, 21 U.S.C.section 360bbb-3(b)(1), unless the authorization is terminated  or revoked sooner.       Influenza A by PCR NEGATIVE NEGATIVE Final   Influenza B by PCR NEGATIVE NEGATIVE Final    Comment: (NOTE) The Xpert Xpress SARS-CoV-2/FLU/RSV plus assay is intended as an aid in the diagnosis of influenza from Nasopharyngeal swab specimens and should not be used as a sole basis for treatment. Nasal washings and aspirates are unacceptable for Xpert Xpress SARS-CoV-2/FLU/RSV testing.  Fact Sheet for Patients: EntrepreneurPulse.com.au  Fact Sheet for Healthcare Providers: IncredibleEmployment.be  This test is not yet approved or cleared by the [place] FDA and has been authorized for detection and/or diagnosis of SARS-CoV-2 by FDA under an Emergency Use Authorization (EUA). This EUA will remain in effect (meaning this test can be used) for the duration of the COVID-19 declaration under Section 564(b)(1) of the Act, 21 U.S.C. section 360bbb-3(b)(1), unless the authorization is terminated or revoked.  Performed at [facility], [address] [place], [place] [address]   Culture, blood (routine x 2)     Status: None (Preliminary result)   Collection Time: 03/06/21  1:54 AM   Specimen: BLOOD  Result Value Ref Range Status   Specimen Description BLOOD LEFT HAND  Final   Special Requests   Final    BOTTLES DRAWN AEROBIC AND ANAEROBIC Blood Culture adequate volume   Culture   Final    NO GROWTH < 12 HOURS Performed at [facility], [address], [place], [place] [address]    Report Status PENDING  Incomplete  Culture, blood (routine x 2)     Status: None (Preliminary result)   Collection Time: 03/06/21  1:56 AM   Specimen: BLOOD  Result Value Ref Range Status    Specimen Description BLOOD RIGHT HAND  Final   Special Requests   Final    BOTTLES DRAWN AEROBIC AND ANAEROBIC Blood Culture adequate volume   Culture   Final    NO GROWTH < 12 HOURS Performed at [facility], [address] [place], [place] [address]    Report Status PENDING  Incomplete         Radiology Studies: CT Head Wo Contrast  Result Date: 03/06/2021 CLINICAL DATA:  Encephalopathy EXAM: CT HEAD WITHOUT CONTRAST TECHNIQUE: Contiguous axial images were obtained from the base of the skull through the vertex without intravenous contrast. COMPARISON:  None. FINDINGS: Brain: There is no mass, hemorrhage or extra-axial collection. There is generalized atrophy without lobar predilection. Hypodensity of the white matter is most commonly associated with chronic microvascular disease. Vascular: Atherosclerotic calcification of the internal carotid arteries at the skull base. No abnormal hyperdensity of the  major intracranial arteries or dural venous sinuses. Skull: The visualized skull base, calvarium and extracranial soft tissues are normal. Sinuses/Orbits: No fluid levels or advanced mucosal thickening of the visualized paranasal sinuses. No mastoid or middle ear effusion. The orbits are normal. IMPRESSION: Generalized atrophy and chronic microvascular ischemia without acute intracranial abnormality. Electronically Signed   By: Ulyses Jarred M.D.   On: 03/06/2021 01:44   DG Chest Port 1 View  Result Date: 03/06/2021 CLINICAL DATA:  Hyperglycemia and confusion EXAM: PORTABLE CHEST 1 VIEW COMPARISON:  12/13/2020 FINDINGS: Diffuse mild interstitial coarsening, unchanged. No focal airspace consolidation or pulmonary edema no pleural effusion or pneumothorax. IMPRESSION: No active cardiopulmonary disease. Electronically Signed   By: Ulyses Jarred M.D.   On: 03/06/2021 01:08        Scheduled Meds:  aspirin  81 mg Oral Daily   atorvastatin  80 mg Oral QHS   chlorthalidone  25 mg  Oral Daily   divalproex  500 mg Oral Daily   insulin aspart  6 Units Subcutaneous TID WC   insulin aspart  0-15 Units Subcutaneous TID WC   insulin aspart  0-5 Units Subcutaneous QHS   insulin glargine-yfgn  20 Units Subcutaneous BID   isosorbide mononitrate  120 mg Oral Daily   Rivaroxaban  15 mg Oral Daily   Continuous Infusions:   LOS: 0 days    Time spent: 37 min    Desma Maxim, MD [facility]   If 7PM-7AM, please contact night-coverage [email or web address] Password [facility] 03/06/2021, 8:23 AM

## 2021-03-06 NOTE — Evaluation (Signed)
Occupational Therapy Evaluation Patient Details Name: [name] MRN: [id number] DOB: 1934/12/23 Today's Date: 03/06/2021   History of Present Illness 86 y.o. male with medical history significant for CAD, HTN, PAD s/p bilateral BKA, paroxysmal A. fib on Xarelto, systolic heart failure (EF 35 to 40% 12/14/2020), CKD 3A, seizure disorder, chronic pain on chronic opiates presenting by EMS with altered mental status and blood sugar readings in the 500s.   Clinical Impression   Pt is agreeable to OT intervention. He appears very restless in bed and is hyper focused on calling his wife or son. Pt oriented to self only and unable to answer questions about home set up. Pt moves in ED stretcher with supervision - min guard and use of bed rails as needed. Pt does not have B LE prosthesis in room. OT called spouse to discuss home set up and PLOF. Pt requires assist to don B LE prosthetics and transfers into manual wheelchair with assistance. He does not ambulate. His wife assists with self care tasks from bed level. As far as cognition, OT discussed how pt presents this session and she reports that this is occasionally normal for him. OT anticipates pt is close to baseline based on caregiver report. She is interested in LTC per chart and her report. Pt would benefit from OT intervention to maintain current strength and balance for functional needs while in hospital. OT does not recommend follow up at discharge.      Recommendations for follow up therapy are one component of a multi-disciplinary discharge planning process, led by the attending physician.  Recommendations may be updated based on patient status, additional functional criteria and insurance authorization.   Follow Up Recommendations  No OT follow up;Other (comment) (per chart and family report they are interested in LTC)    Equipment Recommendations  None recommended by OT       Precautions / Restrictions Precautions Precautions:  Fall Precaution Comments: B AKA      Mobility Bed Mobility Overal bed mobility: Modified Independent             General bed mobility comments: use of bed rail but no physical assist    Transfers Overall transfer level: Needs assistance   Transfers: Lateral/Scoot Transfers          Lateral/Scoot Transfers: Min guard General transfer comment: pt able to laterally scoot along bed with S - min guard. No prosthestics available to attempt transfer and in ED stretcher at this time.    Balance Overall balance assessment: Needs assistance Sitting-balance support: Bilateral upper extremity supported;Feet unsupported Sitting balance-Leahy Scale: Fair                                     ADL either performed or assessed with clinical judgement   ADL Overall ADL's : At baseline                                       General ADL Comments: based on caregiver report     Vision Patient Visual Report: No change from baseline              Pertinent Vitals/Pain Pain Assessment: Faces Pain Score: 0-No pain Faces Pain Scale: No hurt     Hand Dominance Right   Extremity/Trunk Assessment Upper Extremity Assessment Upper Extremity Assessment: Overall  WFL for tasks assessed   Lower Extremity Assessment Lower Extremity Assessment: Defer to PT evaluation       Communication Communication Communication: HOH   Cognition Arousal/Alertness: Awake/alert Behavior During Therapy: Restless Overall Cognitive Status: History of cognitive impairments - at baseline                                 General Comments: OT called wife and discribed pt as being unable to verbalize home set up and oriented to self. She reports ," This is sometimes normal" for him.              Home Living Family/patient expects to be discharged to:: Other (Comment) (family interested in LTC)                                        Prior  Functioning/Environment Level of Independence: Needs assistance        Comments: OT called pt's wife who reports pt needs assist from bathing and dressing from bed level/basin. Pt does not ambulate. He needs assist with donning B LE prosthesis to transfer to w/c with assist. Wife assists pt to transfer onto commode.        OT Problem List: Decreased strength;Decreased activity tolerance;Decreased safety awareness;Impaired balance (sitting and/or standing)      OT Treatment/Interventions: Self-care/ADL training;Therapeutic exercise;Manual therapy;DME and/or AE instruction;Energy conservation;Therapeutic activities;Patient/family education;Balance training    OT Goals(Current goals can be found in the care plan section) Acute Rehab OT Goals Patient Stated Goal: to go to LTC OT Goal Formulation: With family Time For Goal Achievement: 03/20/21 Potential to Achieve Goals: Fair ADL Goals Pt Will Transfer to Toilet: (P) anterior/posterior transfer;bedside commode;with min assist Pt/caregiver will Perform Home Exercise Program: (P) With theraband;Increased strength;Both right and left upper extremity;With written HEP provided;With Supervision  OT Frequency: Min 2X/week   Barriers to D/C:    none known at this time          AM-PAC OT "6 Clicks" Daily Activity     Outcome Measure Help from another person eating meals?: None Help from another person taking care of personal grooming?: None Help from another person toileting, which includes using toliet, bedpan, or urinal?: A Little Help from another person bathing (including washing, rinsing, drying)?: A Little Help from another person to put on and taking off regular upper body clothing?: None Help from another person to put on and taking off regular lower body clothing?: A Little 6 Click Score: 21   End of Session Nurse Communication: Mobility status  Activity Tolerance: Patient tolerated treatment well Patient left: in bed;with call  bell/phone within reach  OT Visit Diagnosis: Muscle weakness (generalized) (M62.81)                Time: 1030-1049 OT Time Calculation (min): 19 min Charges:  OT General Charges $OT Visit: 1 Visit OT Evaluation $OT Eval Low Complexity: 1 Low OT Treatments $Self Care/Home Management : 8-22 mins  Darleen Crocker, MS, OTR/L , CBIS ascom [phone]  03/06/21, 12:31 PM

## 2021-03-06 NOTE — ED Notes (Signed)
Date and time results received: 03/06/21 0110 (use smartphrase ".now" to insert current time)  Test: lactic Critical Value: 2.0  Name of Provider Notified: Dr. Lurline Hare  Orders Received? Or Actions Taken?: n/a

## 2021-03-06 NOTE — ED Notes (Signed)
Safety mitts applied to pt to reduce the ability of pt to remove equipment and IVs.

## 2021-03-06 NOTE — Progress Notes (Signed)
PHARMACY -  BRIEF ANTIBIOTIC NOTE   Pharmacy has received consult(s) for Cefepime, Vancomycin from an ED provider.  The patient's profile has been reviewed for ht/wt/allergies/indication/available labs.    One time order(s) placed for Cefepime 2 gm and Vanc 2 gm  Further antibiotics/pharmacy consults should be ordered by admitting physician if indicated.                       Thank you, Citlalli Weikel D 03/06/2021  1:39 AM

## 2021-03-06 NOTE — ED Triage Notes (Signed)
[facility] - pt from home. Wife called EMS stating that pt wasn't acting right. Cbg 560, VSS.

## 2021-03-06 NOTE — Progress Notes (Addendum)
Inpatient Diabetes Program Recommendations  AACE/ADA: New Consensus Statement on Inpatient Glycemic Control (2015)  Target Ranges:  Prepandial:   less than 140 mg/dL      Peak postprandial:   less than 180 mg/dL (1-2 hours)      Critically ill patients:  140 - 180 mg/dL  Results for [name], [name] (MRN [id number]) as of 03/06/2021 07:25  Ref. Range 03/06/2021 00:39  Glucose Latest Ref Range: 70 - 99 mg/dL 499 (H)    Admit with: AMS/ Hyperglycemia  History: DM, Bilateral BKA, CKD  Home DM Meds: Basaglar 30 units QHS       Metformin 500 mg BID       Novolog 6 units TID with meals  Current Orders: Novolog Moderate Correction Scale/ SSI (0-15 units) TID AC + HS      Basaglar 20 units BID   A1c pending  MD- Note pt has orders for Basaglar.  Will need to switch to [facility] or Levemir during hospitalization.    Addendum 11:15am--Attempted to speak w/ pt in the ED but he was confused.  Called pt's wife Everlene Farrier.  [name] told me she could not name the insulins pt takes at home and did not know the doses either.  Also stated to me she wasn't sure if he has been taking the insulin--Stated pt usually takes his meds on his own and she does not know the specifics of his meds.  Seemed overwhelmed (or confused??) while I was speaking with her.  Stated her son comes over on Saturdays to help with pt's pill boxes.  I asked wife if I could call son but she told me "No" b/c son is currently out of town.  Discussed with wife pt's admission glucose, treatment with IVF and insulin, etc.  Wife appreciative of call and told me to have the hospital call her if they need anything.  Wife does not drive and cannot get to hospital today since daughter is working.     --Will follow patient during hospitalization--  Wyn Quaker RN, MSN, CDE Diabetes Coordinator Inpatient Glycemic Control Team Team Pager: [phone] (8a-5p)

## 2021-03-06 NOTE — ED Notes (Signed)
Pt to ct 

## 2021-03-06 NOTE — ED Notes (Signed)
Pt agitated, keeps trying to remove mitts

## 2021-03-06 NOTE — ED Notes (Signed)
Pt calling for help. Pt found sitting in bed. Pt asking for help with legs stating "my prosthetics are at home and I need them but my wife cant drive." Pt informed that this nurse will inquire with pts nurse to see what status of patients care is.Pt resting in bed calmly.

## 2021-03-06 NOTE — ED Provider Notes (Signed)
[facility] Emergency Department Provider Note   ____________________________________________   Event Date/Time   First MD Initiated Contact with Patient 03/06/21 0032     (approximate)  I have reviewed the triage vital signs and the nursing notes.   HISTORY  Chief Complaint Hyperglycemia  Level V caveat: limited by confusion  HPI [name] is a 86 y.o. male brought to the ED via EMS from home with a chief complaint of hyperglycemia and confusion. Wife called EMS for patient with altered mentation.  FSBS 560.  Denies pain.  Rest of history limited secondary to altered mentation.     Past Medical History:  Diagnosis Date   Arthritis    Atrial fibrillation ([place])    Carcinoma of prostate ([place])    CHF (congestive heart failure) ([place])    Chronic kidney disease    Diabetes mellitus without complication [facility])    ED (erectile dysfunction)    Frequent urination    Hyperlipidemia    Hypertension    Moderate mitral insufficiency    Peripheral vascular disease ([place])    Pneumonia 04/2016   Prostate cancer ([place])    Sleep apnea    OSA--USE C-PAP   Ulcer of left foot due to type 2 diabetes mellitus ([place])    Urinary stress incontinence, male     Patient Active Problem List   Diagnosis Date Noted   Chronic systolic CHF (congestive heart failure) ([place]) 03/06/2021   Hyperglycemia due to type 2 diabetes mellitus ([place]) 11/10/2020   GERD (gastroesophageal reflux disease) 11/10/2020   Chest pain 11/09/2020   Acute encephalopathy 07/29/2020   Lactic acidosis 07/28/2020   Hypoxic 07/28/2020   Chronic prescription opiate use 07/28/2020   Elevated troponin 07/28/2020   Chronic anticoagulation 06/25/2020   Chronic kidney disease, stage 3a ([place]) 06/25/2020   AMS (altered mental status) 04/08/2020   CAD (coronary artery disease) 03/06/2020   Personal history of COVID-19 03/06/2020   Acute toxic-metabolic encephalopathy 25/95/6387   UTI (urinary  tract infection) 03/06/2020   (HFpEF) heart failure with preserved ejection fraction ([place]) 03/06/2020   Goals of care, counseling/discussion    Palliative care by specialist    Seizure disorder ([place]) 11/30/2019   ACS (acute coronary syndrome) ([place]) 06/24/2019   Sinus bradycardia on ECG 05/16/2019   PAF (paroxysmal atrial fibrillation) ([place]) 03/27/2019   Hx of BKA, right ([place]) 03/02/2019   Severe protein-calorie malnutrition ([place]) 56/43/3295   Acute systolic CHF (congestive heart failure) ([place]) 02/16/2019   Acute respiratory failure with hypoxia ([place]) 02/08/2019   Pneumonia due to COVID-19 virus 02/08/2019   Below-knee amputation of right lower extremity ([place]) 01/15/2019   Polyp of descending colon    Iron deficiency anemia due to chronic blood loss    Sacral decubitus ulcer, stage II ([place]) 12/26/2018   Acute respiratory failure ([place]) 12/20/2018   Femur fracture ([employer]) 10/13/2018   Nonspecific chest pain 06/03/2018   NSTEMI (non-ST elevated myocardial infarction) ([place]) 01/16/2018   Acute CHF (congestive heart failure) ([place]) 11/23/2017   Atrial fibrillation with rapid ventricular response ([place]) 10/15/2017   BKA stump complication ([place]) 18/84/1660   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 ([place]) 02/10/2017   S/P bilateral BKA (below knee amputation) ([place]) 02/01/2017   Chronic diastolic heart failure ([place]) 01/21/2017   Pressure injury of skin 01/18/2017   Atherosclerosis of artery of extremity with gangrene ([place]) 01/05/2017   Diabetic foot ulcer ([place]) 12/29/2016   Ataxia 10/21/2016   Atherosclerosis of native arteries of the  extremities with ulceration ([place]) 10/12/2016   History of femoral angiogram 09/06/2016   Left leg pain 09/06/2016   Leukocytosis 09/06/2016   Generalized weakness 09/06/2016   AKI (acute kidney injury) ([place]) 09/06/2016   Atrial fibrillation, chronic ([place]) 08/30/2016   Pneumonia 05/19/2016   Hyperlipidemia 04/20/2016   Back pain 04/19/2016   DM  (diabetes mellitus), type 2 with complications ([place]) 46/27/0350   Primary hypertension 04/19/2016   PAD (peripheral artery disease) ([place]) 04/19/2016   Erectile dysfunction following radical prostatectomy 06/25/2015   History of prostate cancer 12/23/2014   Incontinence 12/23/2014    Past Surgical History:  Procedure Laterality Date   AMPUTATION Left 01/12/2017   Procedure: AMPUTATION BELOW KNEE;  Surgeon: Algernon Huxley, MD;  Location: [facility] ORS;  Service: General;  Laterality: Left;   AMPUTATION Right 12/27/2018   Procedure: AMPUTATION BELOW KNEE;  Surgeon: Algernon Huxley, MD;  Location: [facility] ORS;  Service: General;  Laterality: Right;   APLIGRAFT PLACEMENT Left 10/15/2016   Procedure: APLIGRAFT PLACEMENT;  Surgeon: Albertine Patricia, DPM;  Location: [facility] ORS;  Service: Podiatry;  Laterality: Left;  necrotic ulcer   CHOLECYSTECTOMY     COLONOSCOPY WITH PROPOFOL N/A 01/02/2019   Procedure: COLONOSCOPY WITH PROPOFOL;  Surgeon: Lucilla Lame, MD;  Location: [facility] ENDOSCOPY;  Service: Endoscopy;  Laterality: N/A;   COLONOSCOPY WITH PROPOFOL N/A 01/05/2019   Procedure: COLONOSCOPY WITH PROPOFOL;  Surgeon: Lucilla Lame, MD;  Location: [facility] ENDOSCOPY;  Service: Endoscopy;  Laterality: N/A;   ESOPHAGOGASTRODUODENOSCOPY (EGD) WITH PROPOFOL N/A 01/02/2019   Procedure: ESOPHAGOGASTRODUODENOSCOPY (EGD) WITH PROPOFOL;  Surgeon: Lucilla Lame, MD;  Location: [facility] ENDOSCOPY;  Service: Endoscopy;  Laterality: N/A;   EYE SURGERY Bilateral    Cataract Extraction with IOL   HIP ARTHROPLASTY Right 10/13/2018   Procedure: ARTHROPLASTY BIPOLAR HIP (HEMIARTHROPLASTY);  Surgeon: Earnestine Leys, MD;  Location: [facility] ORS;  Service: Orthopedics;  Laterality: Right;   IRRIGATION AND DEBRIDEMENT FOOT Left 10/15/2016   Procedure: IRRIGATION AND DEBRIDEMENT FOOT-EXCISIONAL DEBRIDEMENT OF SKIN 3RD, 4TH AND 5TH TOES WITH APPLICATION OF APLIGRAFT;  Surgeon: Albertine Patricia, DPM;  Location: [facility] ORS;  Service: Podiatry;  Laterality: Left;   necrotic,gangrene   IRRIGATION AND DEBRIDEMENT FOOT Left 12/09/2016   Procedure: IRRIGATION AND DEBRIDEMENT FOOT-MUSCLE/FASCIA, RESECTION OF FOURTH AND FIFTH METATARSAL NECROTIC BONE;  Surgeon: Albertine Patricia, DPM;  Location: [facility] ORS;  Service: Podiatry;  Laterality: Left;   IRRIGATION AND DEBRIDEMENT FOOT Right 12/23/2018   Procedure: IRRIGATION AND DEBRIDEMENT FOOT and wound vac application;  Surgeon: Samara Deist, DPM;  Location: [facility] ORS;  Service: Podiatry;  Laterality: Right;   LEFT HEART CATH N/A 06/25/2019   Procedure: Left Heart Cath and Coronary Angiography;  Surgeon: Isaias Cowman, MD;  Location: [facility] CV LAB;  Service: Cardiovascular;  Laterality: N/A;   LEFT HEART CATH AND CORONARY ANGIOGRAPHY N/A 06/05/2018   Procedure: LEFT HEART CATH AND CORONARY ANGIOGRAPHY;  Surgeon: Yolonda Kida, MD;  Location: [place] CV LAB;  Service: Cardiovascular;  Laterality: N/A;   LOWER EXTREMITY ANGIOGRAPHY Left 08/16/2016   Procedure: Lower Extremity Angiography;  Surgeon: Algernon Huxley, MD;  Location: [place] CV LAB;  Service: Cardiovascular;  Laterality: Left;   LOWER EXTREMITY ANGIOGRAPHY Left 09/01/2016   Procedure: Lower Extremity Angiography;  Surgeon: Algernon Huxley, MD;  Location: [place] CV LAB;  Service: Cardiovascular;  Laterality: Left;   LOWER EXTREMITY ANGIOGRAPHY Left 10/14/2016   Procedure: Lower Extremity Angiography;  Surgeon: Algernon Huxley, MD;  Location: [facility] CV LAB;  Service: Cardiovascular;  Laterality: Left;   LOWER EXTREMITY ANGIOGRAPHY Left 12/20/2016   Procedure: Lower Extremity Angiography;  Surgeon: Algernon Huxley, MD;  Location: [place] CV LAB;  Service: Cardiovascular;  Laterality: Left;   LOWER EXTREMITY ANGIOGRAPHY Right 12/18/2018   Procedure: LOWER EXTREMITY ANGIOGRAPHY;  Surgeon: Algernon Huxley, MD;  Location: [place] CV LAB;  Service: Cardiovascular;  Laterality: Right;   LOWER EXTREMITY INTERVENTION  09/01/2016    Procedure: Lower Extremity Intervention;  Surgeon: Algernon Huxley, MD;  Location: [place] CV LAB;  Service: Cardiovascular;;   PROSTATE SURGERY     removal   TONSILLECTOMY     as a child    Prior to Admission medications   Medication Sig Start Date End Date Taking? Authorizing Provider  atorvastatin (LIPITOR) 80 MG tablet Take 80 mg by mouth at bedtime.   Yes [provider]  bisacodyl (DULCOLAX) 5 MG EC tablet Take 1 tablet (5 mg total) by mouth daily as needed for moderate constipation. 04/10/20  Yes Lorella Nimrod, MD  chlorthalidone (HYGROTON) 25 MG tablet Take 25 mg by mouth daily.   Yes [provider]  divalproex (DEPAKOTE) 500 MG DR tablet Take 500 mg by mouth daily.  02/18/20  Yes [provider]  Insulin Glargine (BASAGLAR KWIKPEN) 100 UNIT/ML Inject 20 Units into the skin 2 (two) times daily. Patient taking differently: Inject 25 Units into the skin at bedtime. 11/10/20  Yes Sharen Hones, MD  isosorbide mononitrate (IMDUR) 30 MG 24 hr tablet Take 60 mg by mouth daily. 02/26/21  Yes [provider]  nitroGLYCERIN (NITROSTAT) 0.4 MG SL tablet Place 1 tablet (0.4 mg total) under the tongue every 5 (five) minutes as needed for chest pain. 10/25/20  Yes [name] Mandes, MD  NOVOLOG FLEXPEN 100 UNIT/ML FlexPen Inject 6 Units into the skin 3 (three) times daily with meals. Patient taking differently: Inject 6 Units into the skin daily. 11/10/20  Yes Sharen Hones, MD  aspirin 81 MG EC tablet Take 81 mg by mouth daily.    [provider]  diltiazem (CARDIZEM CD) 240 MG 24 hr capsule Take 1 capsule (240 mg total) by mouth daily. 12/15/20 01/14/21  Sidney Ace, MD  Ensure Max Protein (ENSURE MAX PROTEIN) LIQD Take 330 mLs (11 oz total) by mouth 2 (two) times daily. 04/10/20   Lorella Nimrod, MD  ferrous sulfate 325 (65 FE) MG tablet Take 325 mg by mouth daily with breakfast.    [provider]  isosorbide mononitrate (IMDUR) 120 MG 24 hr  tablet Take 1 tablet (120 mg total) by mouth daily. Patient not taking: Reported on 03/06/2021 10/26/20   [name] Mandes, MD  metFORMIN (GLUCOPHAGE) 500 MG tablet Take 500 mg by mouth 2 (two) times daily with a meal.    [provider]  metoprolol succinate (TOPROL-XL) 50 MG 24 hr tablet Take 1 tablet (50 mg total) by mouth daily. Take with or immediately following a meal. 12/15/20 01/14/21  Sidney Ace, MD  Rivaroxaban (XARELTO) 15 MG TABS tablet Take 1 tablet (15 mg total) by mouth daily with supper. Hold Xarelto for 3 days.  Start on January 09, 2019. Patient taking differently: Take 15 mg by mouth daily with supper. 01/09/19   [name], MD    Allergies Contrast media [iodinated diagnostic agents], Iohexol, Metrizamide, and Latex  Family History  Problem Relation Age of Onset   Hypertension Mother    Diabetes Mother    Prostate cancer Neg Hx    Bladder  Cancer Neg Hx    Kidney disease Neg Hx     Social History Social History   Tobacco Use   Smoking status: Former    Packs/day: 0.25    Types: Cigarettes    Quit date: 10/14/1994    Years since quitting: 26.4   Smokeless tobacco: Never  Vaping Use   Vaping Use: Never used  Substance Use Topics   Alcohol use: No    Alcohol/week: 0.0 standard drinks   Drug use: No    Review of Systems  Constitutional: Positive for hyperglycemia.  No fever/chills Eyes: No visual changes. ENT: No sore throat. Cardiovascular: Denies chest pain. Respiratory: Denies shortness of breath. Gastrointestinal: No abdominal pain.  No nausea, no vomiting.  No diarrhea.  No constipation. Genitourinary: Negative for dysuria. Musculoskeletal: Negative for back pain. Skin: Negative for rash. Neurological: Positive for altered mental status.  Negative for headaches, focal weakness or numbness.   ____________________________________________   PHYSICAL EXAM:  VITAL SIGNS: ED Triage Vitals  Enc Vitals Group     BP --      Pulse --       Resp --      Temp --      Temp src --      SpO2 --      Weight 03/06/21 0030 185 lb 6.5 oz (84.1 kg)     Height 03/06/21 0030 5\' 7"  (1.702 m)     Head Circumference --      Peak Flow --      Pain Score 03/06/21 0029 0     Pain Loc --      Pain Edu? --      Excl. in [place]? --     Constitutional: Alert and oriented.  Elderly appearing and in no acute distress. Eyes: Conjunctivae are normal. PERRL. EOMI. Head: Atraumatic. Nose: No congestion/rhinnorhea. Mouth/Throat: Mucous membranes are mildly dry. Neck: No stridor.   Cardiovascular: Bradycardic rate, regular rhythm. Grossly normal heart sounds.  Good peripheral circulation. Respiratory: Normal respiratory effort.  No retractions. Lungs CTAB. Gastrointestinal: Soft and nontender to light or deep palpation. No distention. No abdominal bruits. No CVA tenderness. Musculoskeletal: Bilateral BKA's.   Neurologic: Alert and oriented to person and place.  Normal speech and language. No gross focal neurologic deficits are appreciated.  Skin:  Skin is warm, dry and intact. No rash noted.  No petechiae. Psychiatric: Mood and affect are normal. Speech and behavior are normal.  ____________________________________________   LABS (all labs ordered are listed, but only abnormal results are displayed)  Labs Reviewed  COMPREHENSIVE METABOLIC PANEL - Abnormal; Notable for the following components:      Result Value   Sodium 132 (*)    Chloride 95 (*)    Glucose, Bld 499 (*)    BUN 31 (*)    Creatinine, Ser 1.91 (*)    GFR, Estimated 34 (*)    All other components within normal limits  LACTIC ACID, PLASMA - Abnormal; Notable for the following components:   Lactic Acid, Venous 2.0 (*)    All other components within normal limits  URINALYSIS, COMPLETE (UACMP) WITH MICROSCOPIC - Abnormal; Notable for the following components:   Color, Urine YELLOW (*)    APPearance HAZY (*)    Glucose, UA >=500 (*)    Ketones, ur 5 (*)    Protein, ur 30 (*)     All other components within normal limits  CBG MONITORING, ED - Abnormal; Notable for the following components:  Glucose-Capillary 467 (*)    All other components within normal limits  TROPONIN I (HIGH SENSITIVITY) - Abnormal; Notable for the following components:   Troponin I (High Sensitivity) 41 (*)    All other components within normal limits  TROPONIN I (HIGH SENSITIVITY) - Abnormal; Notable for the following components:   Troponin I (High Sensitivity) 39 (*)    All other components within normal limits  RESP PANEL BY RT-PCR (FLU A&B, COVID) ARPGX2  CULTURE, BLOOD (ROUTINE X 2)  CULTURE, BLOOD (ROUTINE X 2)  CBC WITH DIFFERENTIAL/PLATELET  LACTIC ACID, PLASMA  HEMOGLOBIN A1C   ____________________________________________  EKG  ED ECG REPORT I, Alexxis Mackert J, the attending physician, personally viewed and interpreted this ECG.   Date: 03/06/2021  EKG Time: 0033  Rate: 56  Rhythm: sinus bradycardia  Axis: Normal  Intervals:none  ST&T Change: Nonspecific  ____________________________________________  RADIOLOGY I, Bader Stubblefield J, personally viewed and evaluated these images (plain radiographs) as part of my medical decision making, as well as reviewing the written report by the radiologist.  ED MD interpretation: No ICH, no acute cardiopulmonary process  Official radiology report(s): CT Head Wo Contrast  Result Date: 03/06/2021 CLINICAL DATA:  Encephalopathy EXAM: CT HEAD WITHOUT CONTRAST TECHNIQUE: Contiguous axial images were obtained from the base of the skull through the vertex without intravenous contrast. COMPARISON:  None. FINDINGS: Brain: There is no mass, hemorrhage or extra-axial collection. There is generalized atrophy without lobar predilection. Hypodensity of the white matter is most commonly associated with chronic microvascular disease. Vascular: Atherosclerotic calcification of the internal carotid arteries at the skull base. No abnormal hyperdensity of the  major intracranial arteries or dural venous sinuses. Skull: The visualized skull base, calvarium and extracranial soft tissues are normal. Sinuses/Orbits: No fluid levels or advanced mucosal thickening of the visualized paranasal sinuses. No mastoid or middle ear effusion. The orbits are normal. IMPRESSION: Generalized atrophy and chronic microvascular ischemia without acute intracranial abnormality. Electronically Signed   By: Ulyses Jarred M.D.   On: 03/06/2021 01:44   DG Chest Port 1 View  Result Date: 03/06/2021 CLINICAL DATA:  Hyperglycemia and confusion EXAM: PORTABLE CHEST 1 VIEW COMPARISON:  12/13/2020 FINDINGS: Diffuse mild interstitial coarsening, unchanged. No focal airspace consolidation or pulmonary edema no pleural effusion or pneumothorax. IMPRESSION: No active cardiopulmonary disease. Electronically Signed   By: Ulyses Jarred M.D.   On: 03/06/2021 01:08    ____________________________________________   PROCEDURES  Procedure(s) performed (including Critical Care):  .1-3 Lead EKG Interpretation Performed by: Paulette Blanch, MD Authorized by: Paulette Blanch, MD     Interpretation: abnormal     ECG rate:  54   ECG rate assessment: bradycardic     Rhythm: sinus bradycardia     Ectopy: none     Conduction: normal   Comments:     Patient placed on cardiac monitor to evaluate for arrhythmias   ____________________________________________   INITIAL IMPRESSION / ASSESSMENT AND PLAN / ED COURSE  As part of my medical decision making, I reviewed the following data within the [place] notes reviewed and incorporated, Labs reviewed, EKG interpreted, Old chart reviewed, Radiograph reviewed, and Notes from prior ED visits     86 year old male brought to the ED via EMS for altered mentation and hyperglycemia. Differential diagnosis includes, but is not limited to, alcohol, illicit or prescription medications, or other toxic ingestion; intracranial pathology  such as stroke or intracerebral hemorrhage; fever or infectious causes including sepsis; hypoxemia and/or hypercarbia; uremia; trauma;  endocrine related disorders such as diabetes, hypoglycemia, and thyroid-related diseases; hypertensive encephalopathy; etc.   Will obtain sepsis work-up, UA, CT head, chest x-ray.  Initiate IV fluid resuscitation for hyperglycemia.  Will reassess.  Clinical Course as of 03/06/21 0436  Fri Mar 06, 2021  0213 Updated patient on all test results.  3 page rhythm strip demonstrates sinus bradycardia Mobitz 2.  I reviewed patient's medication record and note he is on metoprolol.  We will recheck blood sugar after 30 cc/kilo IV fluids.  Will discuss with hospitalist services for admission. [JS]    Clinical Course User Index [JS] Paulette Blanch, MD     ____________________________________________   FINAL CLINICAL IMPRESSION(S) / ED DIAGNOSES  Final diagnoses:  Hyperglycemia  Altered mental status, unspecified altered mental status type  Sepsis, due to unspecified organism, unspecified whether acute organ dysfunction present ([place])  Bradycardia  Mobitz II  AKI (acute kidney injury) [facility])     ED Discharge Orders     None        Note:  This document was prepared using Dragon voice recognition software and may include unintentional dictation errors.    Paulette Blanch, MD 03/06/21 [phone]

## 2021-03-06 NOTE — Progress Notes (Signed)
CODE SEPSIS - PHARMACY COMMUNICATION  **Broad Spectrum Antibiotics should be administered within 1 hour of Sepsis diagnosis**  Time Code Sepsis Called/Page Received: 10/14 @ 0121  Antibiotics Ordered: Vanc , Cefepime   Time of 1st antibiotic administration:  10/14 @ 0154  Additional action taken by pharmacy:   If necessary, Name of Provider/Nurse Contacted:     Elisabel Hanover D ,PharmD Clinical Pharmacist  03/06/2021  2:00 AM

## 2021-03-06 NOTE — ED Notes (Addendum)
Daughter Joseph Art was called to give update on pt. Wouk MD made aware of family wanting SNF placement upon discharge

## 2021-03-06 NOTE — H&P (Signed)
History and Physical    [name] MWU:[id number] DOB: 09-16-1934 DOA: 03/06/2021  PCP: Corey Skains, MD   Patient coming from: Home  I have personally briefly reviewed patient's old medical records in [place]  Chief Complaint: Altered mental status, high blood sugar  HPI: [name] is a 86 y.o. male with medical history significant for CAD, HTN, PAD s/p bilateral BKA, paroxysmal A. fib on Xarelto, systolic heart failure (EF 35 to 40% 12/14/2020), CKD 3A, seizure disorder, chronic pain on chronic opiates presenting by EMS with altered mental status and blood sugar readings in the 500s.  History is limited due to altered mental status and is taken mostly from ED provider.  ED course: On arrival, bradycardic 54-59 with otherwise normal vitals Blood work WBC 8.9, lactic acid 2.0> 1.6.  Blood glucose 499, creatinine 1.91 above baseline of 1.38, troponin 41.  Urinalysis unremarkable, COVID and flu negative  EKG, personally viewed and interpreted: Sinus rhythm at 56 with nonspecific ST-T wave changes  Imaging: Chest x-ray with no active cardiopulmonary disease CT head no acute intracranial abnormality  Patient started on cefepime vancomycin and Flagyl for sepsis of unknown source.  Hospitalist consulted for admission.  Review of Systems: Unable to obtain due to altered mental status and family unavailable   Past Medical History:  Diagnosis Date   Arthritis    Atrial fibrillation ([place])    Carcinoma of prostate ([place])    CHF (congestive heart failure) ([place])    Chronic kidney disease    Diabetes mellitus without complication ([place])    ED (erectile dysfunction)    Frequent urination    Hyperlipidemia    Hypertension    Moderate mitral insufficiency    Peripheral vascular disease ([place])    Pneumonia 04/2016   Prostate cancer ([place])    Sleep apnea    OSA--USE C-PAP   Ulcer of left foot due to type 2 diabetes mellitus ([place])    Urinary stress  incontinence, male     Past Surgical History:  Procedure Laterality Date   AMPUTATION Left 01/12/2017   Procedure: AMPUTATION BELOW KNEE;  Surgeon: Algernon Huxley, MD;  Location: [facility];  Service: General;  Laterality: Left;   AMPUTATION Right 12/27/2018   Procedure: AMPUTATION BELOW KNEE;  Surgeon: Algernon Huxley, MD;  Location: [facility];  Service: General;  Laterality: Right;   APLIGRAFT PLACEMENT Left 10/15/2016   Procedure: APLIGRAFT PLACEMENT;  Surgeon: Albertine Patricia, DPM;  Location: [facility];  Service: Podiatry;  Laterality: Left;  necrotic ulcer   CHOLECYSTECTOMY     COLONOSCOPY WITH PROPOFOL N/A 01/02/2019   Procedure: COLONOSCOPY WITH PROPOFOL;  Surgeon: Lucilla Lame, MD;  Location: [facility] END[facility];  Service: Endoscopy;  Laterality: N/A;   COLONOSCOPY WITH PROPOFOL N/A 01/05/2019   Procedure: COLONOSCOPY WITH PROPOFOL;  Surgeon: Lucilla Lame, MD;  Location: [facility] ENDOSCOPY;  Service: Endoscopy;  Laterality: N/A;   ESOPHAGOGASTRODUODENOSCOPY (EGD) WITH PROPOFOL N/A 01/02/2019   Procedure: ESOPHAGOGASTRODUODENOSCOPY (EGD) WITH PROPOFOL;  Surgeon: Lucilla Lame, MD;  Location: [facility] ENDOSCOPY;  Service: Endoscopy;  Laterality: N/A;   EYE SURGERY Bilateral    Cataract Extraction with IOL   HIP ARTHROPLASTY Right 10/13/2018   Procedure: ARTHROPLASTY BIPOLAR HIP (HEMIARTHROPLASTY);  Surgeon: Earnestine Leys, MD;  Location: [facility];  Service: Orthopedics;  Laterality: Right;   IRRIGATION AND DEBRIDEMENT FOOT Left 10/15/2016   Procedure: IRRIGATION AND DEBRIDEMENT FOOT-EXCISIONAL DEBRIDEMENT OF SKIN 3RD, 4TH AND 5TH TOES WITH APPLICATION OF APLIGRAFT;  Surgeon: Albertine Patricia, DPM;  Location: [facility];  Service: Podiatry;  Laterality: Left;  necrotic,gangrene   IRRIGATION AND DEBRIDEMENT FOOT Left 12/09/2016   Procedure: IRRIGATION AND DEBRIDEMENT FOOT-MUSCLE/FASCIA, RESECTION OF FOURTH AND FIFTH METATARSAL NECROTIC BONE;  Surgeon: Albertine Patricia, DPM;  Location: [facility];  Service: Podiatry;   Laterality: Left;   IRRIGATION AND DEBRIDEMENT FOOT Right 12/23/2018   Procedure: IRRIGATION AND DEBRIDEMENT FOOT and wound vac application;  Surgeon: Samara Deist, DPM;  Location: [facility];  Service: Podiatry;  Laterality: Right;   LEFT HEART CATH N/A 06/25/2019   Procedure: Left Heart Cath and Coronary Angiography;  Surgeon: Isaias Cowman, MD;  Location: [place] [facility];  Service: Cardiovascular;  Laterality: N/A;   LEFT HEART CATH AND CORONARY ANGIOGRAPHY N/A 06/05/2018   Procedure: LEFT HEART CATH AND CORONARY ANGIOGRAPHY;  Surgeon: Yolonda Kida, MD;  Location: [place] [facility];  Service: Cardiovascular;  Laterality: N/A;   LOWER EXTREMITY ANGIOGRAPHY Left 08/16/2016   Procedure: Lower Extremity Angiography;  Surgeon: Algernon Huxley, MD;  Location: [place] [facility];  Service: Cardiovascular;  Laterality: Left;   LOWER EXTREMITY ANGIOGRAPHY Left 09/01/2016   Procedure: Lower Extremity Angiography;  Surgeon: Algernon Huxley, MD;  Location: [place] [facility];  Service: Cardiovascular;  Laterality: Left;   LOWER EXTREMITY ANGIOGRAPHY Left 10/14/2016   Procedure: Lower Extremity Angiography;  Surgeon: Algernon Huxley, MD;  Location: [place] [facility];  Service: Cardiovascular;  Laterality: Left;   LOWER EXTREMITY ANGIOGRAPHY Left 12/20/2016   Procedure: Lower Extremity Angiography;  Surgeon: Algernon Huxley, MD;  Location: [place] [facility];  Service: Cardiovascular;  Laterality: Left;   LOWER EXTREMITY ANGIOGRAPHY Right 12/18/2018   Procedure: LOWER EXTREMITY ANGIOGRAPHY;  Surgeon: Algernon Huxley, MD;  Location: [place] [facility];  Service: Cardiovascular;  Laterality: Right;   LOWER EXTREMITY INTERVENTION  09/01/2016   Procedure: Lower Extremity Intervention;  Surgeon: Algernon Huxley, MD;  Location: [place] [facility];  Service: Cardiovascular;;   PROSTATE SURGERY     removal   TONSILLECTOMY     as a child     reports that he quit smoking about 26 years ago. His smoking use  included cigarettes. He smoked an average of .25 packs per day. He has never used smokeless tobacco. He reports that he does not drink alcohol and does not use drugs.  Allergies  Allergen Reactions   Contrast Media [Iodinated Diagnostic Agents] Shortness Of Breath    SOB   Iohexol Shortness Of Breath     Desc: Respiratory Distress, Laryngedema, diaphoresis, Onset Date: [id number]    Metrizamide Shortness Of Breath    SOB SOB SOB    Latex Rash    Family History  Problem Relation Age of Onset   Hypertension Mother    Diabetes Mother    Prostate cancer Neg Hx    Bladder Cancer Neg Hx    Kidney disease Neg Hx       Prior to Admission medications   Medication Sig Start Date End Date Taking? Authorizing Provider  aspirin 81 MG EC tablet Take 81 mg by mouth daily.    [provider]  atorvastatin (LIPITOR) 80 MG tablet Take 80 mg by mouth at bedtime.    [provider]  bisacodyl (DULCOLAX) 5 MG EC tablet Take 1 tablet (5 mg total) by mouth daily as needed for moderate constipation. 04/10/20   Lorella Nimrod, MD  diltiazem (CARDIZEM CD) 240 MG 24 hr capsule Take 1 capsule (240 mg total) by  mouth daily. 12/15/20 01/14/21  Sidney Ace, MD  divalproex (DEPAKOTE) 500 MG DR tablet Take 500 mg by mouth daily.  02/18/20   [provider]  Ensure Max Protein (ENSURE MAX PROTEIN) LIQD Take 330 mLs (11 oz total) by mouth 2 (two) times daily. 04/10/20   Lorella Nimrod, MD  ferrous sulfate 325 (65 FE) MG tablet Take 325 mg by mouth daily with breakfast.    [provider]  Insulin Glargine (BASAGLAR KWIKPEN) 100 UNIT/ML Inject 20 Units into the skin 2 (two) times daily. Patient taking differently: Inject 25 Units into the skin at bedtime. 11/10/20   Sharen Hones, MD  isosorbide mononitrate (IMDUR) 120 MG 24 hr tablet Take 1 tablet (120 mg total) by mouth daily. 10/26/20   [name] Mandes, MD  metFORMIN (GLUCOPHAGE) 500 MG tablet Take 500 mg by mouth 2 (two) times  daily with a meal.    [provider]  metoprolol succinate (TOPROL-XL) 50 MG 24 hr tablet Take 1 tablet (50 mg total) by mouth daily. Take with or immediately following a meal. 12/15/20 01/14/21  Ralene Muskrat B, MD  nitroGLYCERIN (NITROSTAT) 0.4 MG SL tablet Place 1 tablet (0.4 mg total) under the tongue every 5 (five) minutes as needed for chest pain. 10/25/20   [name] Mandes, MD  NOVOLOG FLEXPEN 100 UNIT/ML FlexPen Inject 6 Units into the skin 3 (three) times daily with meals. Patient taking differently: Inject 6 Units into the skin daily. 11/10/20   Sharen Hones, MD  Rivaroxaban (XARELTO) 15 MG TABS tablet Take 1 tablet (15 mg total) by mouth daily with supper. Hold Xarelto for 3 days.  Start on January 09, 2019. Patient taking differently: Take 15 mg by mouth daily with supper. 01/09/19   [name], MD  chlorthalidone (HYGROTON) 25 MG tablet Take 25 mg by mouth daily.  12/15/20  [provider]    Physical Exam: Vitals:   03/06/21 0100 03/06/21 0145 03/06/21 0200 03/06/21 0230  BP: 129/69 (!) 162/85 (!) 167/75 (!) 170/66  Pulse: (!) 41 (!) 55 (!) 42 63  Resp: 20 11 20 18   Temp:      SpO2: 93% 94% 94% 94%  Weight:      Height:         Vitals:   03/06/21 0100 03/06/21 0145 03/06/21 0200 03/06/21 0230  BP: 129/69 (!) 162/85 (!) 167/75 (!) 170/66  Pulse: (!) 41 (!) 55 (!) 42 63  Resp: 20 11 20 18   Temp:      SpO2: 93% 94% 94% 94%  Weight:      Height:          Constitutional: Lethargic, confused and oriented x 1 . Not in any apparent distres HEENT:      Head: Normocephalic and atraumatic.         Eyes: PERLA, EOMI, Conjunctivae are normal. Sclera is non-icteric.       Mouth/Throat: Mucous membranes are moist.       Neck: Supple with no signs of meningismus. Cardiovascular: Regular rate and rhythm. No murmurs, gallops, or rubs. 2+ symmetrical distal pulses are present . No JVD. No LE edema Respiratory: Respiratory effort normal .Lungs sounds clear bilaterally.  No wheezes, crackles, or rhonchi.  Gastrointestinal: Soft, non tender, and non distended with positive bowel sounds.  Genitourinary: No CVA tenderness. Musculoskeletal: Nontender with normal range of motion in all extremities. No cyanosis, or erythema of extremities. Neurologic:  Face is symmetric. Moving all extremities. No gross focal neurologic deficits .  Skin: Skin is warm, dry.  No rash or ulcers Psychiatric: Mood and affect difficult to assess due to altered mental status   Labs on Admission: I have personally reviewed following labs and imaging studies  CBC: Recent Labs  Lab 03/06/21 0039  WBC 8.9  NEUTROABS 5.7  HGB 14.2  HCT 40.3  MCV 85.7  PLT 509   Basic Metabolic Panel: Recent Labs  Lab 03/06/21 0039  NA 132*  K 4.1  CL 95*  CO2 25  GLUCOSE 499*  BUN 31*  CREATININE 1.91*  CALCIUM 8.9   GFR: Estimated Creatinine Clearance: 30.4 mL/min (A) (by C-G formula based on SCr of 1.91 mg/dL (H)). Liver Function Tests: Recent Labs  Lab 03/06/21 0039  AST 22  ALT 19  ALKPHOS 104  BILITOT 1.0  PROT 6.7  ALBUMIN 3.5   No results for input(s): LIPASE, AMYLASE in the last 168 hours. No results for input(s): AMMONIA in the last 168 hours. Coagulation Profile: No results for input(s): INR, PROTIME in the last 168 hours. Cardiac Enzymes: No results for input(s): CKTOTAL, CKMB, CKMBINDEX, TROPONINI in the last 168 hours. BNP (last 3 results) No results for input(s): PROBNP in the last 8760 hours. HbA1C: No results for input(s): HGBA1C in the last 72 hours. CBG: Recent Labs  Lab 03/06/21 0038  GLUCAP 467*   Lipid Profile: No results for input(s): CHOL, HDL, LDLCALC, TRIG, CHOLHDL, LDLDIRECT in the last 72 hours. Thyroid Function Tests: No results for input(s): TSH, T4TOTAL, FREET4, T3FREE, THYROIDAB in the last 72 hours. Anemia Panel: No results for input(s): VITAMINB12, FOLATE, FERRITIN, TIBC, IRON, RETICCTPCT in the last 72 hours. Urine analysis:     Component Value Date/Time   COLORURINE YELLOW (A) 03/06/2021 0055   APPEARANCEUR HAZY (A) 03/06/2021 0055   LABSPEC 1.026 03/06/2021 0055   PHURINE 5.0 03/06/2021 0055   GLUCOSEU >=500 (A) 03/06/2021 0055   HGBUR NEGATIVE 03/06/2021 0055   BILIRUBINUR NEGATIVE 03/06/2021 0055   KETONESUR 5 (A) 03/06/2021 0055   PROTEINUR 30 (A) 03/06/2021 0055   NITRITE NEGATIVE 03/06/2021 0055   LEUKOCYTESUR NEGATIVE 03/06/2021 0055    Radiological Exams on Admission: CT Head Wo Contrast  Result Date: 03/06/2021 CLINICAL DATA:  Encephalopathy EXAM: CT HEAD WITHOUT CONTRAST TECHNIQUE: Contiguous axial images were obtained from the base of the skull through the vertex without intravenous contrast. COMPARISON:  None. FINDINGS: Brain: There is no mass, hemorrhage or extra-axial collection. There is generalized atrophy without lobar predilection. Hypodensity of the white matter is most commonly associated with chronic microvascular disease. Vascular: Atherosclerotic calcification of the internal carotid arteries at the skull base. No abnormal hyperdensity of the major intracranial arteries or dural venous sinuses. Skull: The visualized skull base, calvarium and extracranial soft tissues are normal. Sinuses/Orbits: No fluid levels or advanced mucosal thickening of the visualized paranasal sinuses. No mastoid or middle ear effusion. The orbits are normal. IMPRESSION: Generalized atrophy and chronic microvascular ischemia without acute intracranial abnormality. Electronically Signed   By: Ulyses Jarred M.D.   On: 03/06/2021 01:44   DG Chest Port 1 View  Result Date: 03/06/2021 CLINICAL DATA:  Hyperglycemia and confusion EXAM: PORTABLE CHEST 1 VIEW COMPARISON:  12/13/2020 FINDINGS: Diffuse mild interstitial coarsening, unchanged. No focal airspace consolidation or pulmonary edema no pleural effusion or pneumothorax. IMPRESSION: No active cardiopulmonary disease. Electronically Signed   By: Ulyses Jarred M.D.   On:  03/06/2021 01:08     Assessment/Plan Principal Problem:   Acute toxic-metabolic encephalopathy Active Problems:  Hyperglycemia due to type 2 diabetes mellitus (HCC)   Lactic acidosis   CAD (coronary artery disease)   Elevated troponin   Chronic systolic CHF (congestive heart failure) (HCC)   PAF (paroxysmal atrial fibrillation) (HCC)   Chronic kidney disease, stage 3a (HCC)   PAD (peripheral artery disease) (HCC)   Chronic prescription opiate use   86 year old with history of DM, HTN, PAD status post left BKA, CAD, diastolic heart failure, CKD stage IIIa, A. fib on Xarelto, systolic heart failure, presenting with altered mental status      Acute metabolic encephalopathy  - Etiology uncertain, possibly related to hyperglycemia.  --Except for lactic acid, workup not suggestive of infection or sepsis - CT head with no acute intracranial findings - Neurologic checks with fall and aspiration precautions  Hyperglycemia and type 2 diabetes - Blood sugar in the 500s - Sliding scale insulin coverage and resume home basal insulin  Lactic acidosis,?  Sepsis - Lactic acid 2 but otherwise not meeting sepsis criteria - Patient was given cefepime vancomycin and Flagyl for sepsis of unknown source - Follow cultures - Antibiotics not continued at this time due to low suspicion for sepsis but continue to monitor    Sinus bradycardia on ECG -Continuous cardiac monitoring -Patient noted to be on both diltiazem and metoprolol.  Will hold both for now   Elevated troponin Coronary artery disease - Low suspicion for ACS but will continue to trend -Continue aspirin and atorvastatin.  Holding metoprolol due to sinus bradycardia  Chronic systolic heart failure - Appears euvolemic - EF 35 to 40% 12/14/2020 - Continue chlorthalidone.  Will hold metoprolol because of bradycardia      PAD (peripheral artery disease) (HCC) -Continue aspirin and atorvastatin     Atrial fibrillation, chronic  (HCC)   Chronic anticoagulation -Continue Xarelto    S/P bilateral BKA (below knee amputation) ([place]) -Increase nursing assistance for transfer -Trapeze if desired     Seizure disorder ([place]) -Continue Depakote and gabapentin   AKI superimposed on chronic kidney disease, stage 3a  - Creatinine 1.91 above baseline of 1.38 - Continue to monitor  DVT prophylaxis: Xarelto Code Status: full code  Family Communication:  none  Disposition Plan: Back to previous home environment Consults called: none  Status:At the time of admission, it appears that the appropriate admission status for this patient is INPATIENT. This is judged to be reasonable and necessary in order to provide the required intensity of service to ensure the patient's safety given the presenting symptoms, physical exam findings, and initial radiographic and laboratory data in the context of their  Comorbid conditions.   Patient requires inpatient status due to high intensity of service, high risk for further deterioration and high frequency of surveillance required.   I certify that at the point of admission it is my clinical judgment that the patient will require inpatient hospital care spanning beyond [place] MD [facility]     03/06/2021, 2:57 AM

## 2021-03-06 NOTE — Evaluation (Signed)
Physical Therapy Evaluation Patient Details Name: [name] MRN: [id number] DOB: 04/04/1935 Today's Date: 03/06/2021  History of Present Illness  [name] is a 86 y.o. male with medical history significant for CAD, HTN, PAD s/p bilateral BKA, paroxysmal A. fib on Xarelto, systolic heart failure (EF 35 to 40% 12/14/2020), CKD 3A, seizure disorder, chronic pain on chronic opiates presenting by EMS with altered mental status and blood sugar readings in the 500s.  History is limited due to altered mental status.   Clinical Impression  Pt is an 86 year old male who presents to PT evaluation with AMS and hyperglycemia and PMH of B AKA. Pt's latest blood glucose was 375 and received clearance to treat patient from MD via secure chat. Pt oriented to self only and per chart review, this is the patient's baseline. Pt unable to provide prior level of function so per chart review, pt requires assistance with donning/doffing B LE prosthesis and to complete WC and toilet transfers with significant assistance from spouse. Pt is able to propel himself household distances in [facility] at baseline per chart review. Evaluation limited due to lack of B LE prosthesis, but pt demonstrating bed mobility with SPV/CGA and verbal cueing. Pt demonstrates difficulty with command following. Family members have expressed interest in [place]. Per chart review and discussion with OT, pt appears to be at baseline and does not require follow up PT at this time.      Recommendations for follow up therapy are one component of a multi-disciplinary discharge planning process, led by the attending physician.  Recommendations may be updated based on patient status, additional functional criteria and insurance authorization.  Follow Up Recommendations No PT follow up    Equipment Recommendations       Recommendations for Other Services       Precautions / Restrictions Precautions Precautions: Fall Precaution Comments:  B AKA Restrictions Weight Bearing Restrictions: No      Mobility  Bed Mobility Overal bed mobility: Needs Assistance Bed Mobility: Supine to Sit;Sit to Supine;Sit to Sidelying;Sidelying to Sit;Rolling Rolling: Supervision Sidelying to sit: Supervision Supine to sit: Min guard Sit to supine: Min guard Sit to sidelying: Min guard General bed mobility comments: Pt requiring verbal cues for all bed mobility tasks and demonstrates poor command following. Pt able to demonstrate rolling and sidelying to sit towards both sides with SPV for changing of bed linens and diaper.    Transfers Overall transfer level: Needs assistance   Transfers: Lateral/Scoot Transfers          Lateral/Scoot Transfers: Min guard General transfer comment: No prosthesis available at evaluation - deferred at this time for safety  Ambulation/Gait             General Gait Details: Per chart review, pt is non-ambulatory at baseline. Uses WC for main mode of mobility  Hotel manager mobility:  (At baseline, pt requires assistance with transfer to [facility] but can propel himself household distances per spouse conversation with OT)  Modified Rankin (Stroke Patients Only)       Balance Overall balance assessment: Needs assistance Sitting-balance support: Bilateral upper extremity supported Sitting balance-Leahy Scale: Fair                                       Pertinent Vitals/Pain Pain Assessment: Faces Pain  Score: 0-No pain Faces Pain Scale: No hurt    Home Living Family/patient expects to be discharged to:: Other (Comment) (Family interested in LTC)                      Prior Function Level of Independence: Needs assistance         Comments: Per OT note, pt requires assistance with all mobility tasks including WC and toliet transfers. Does have B LE prosthesis but requires assistance to do/doff prosthesis      Hand Dominance   Dominant Hand: Right    Extremity/Trunk Assessment   Upper Extremity Assessment Upper Extremity Assessment: Defer to OT evaluation    Lower Extremity Assessment Lower Extremity Assessment: Difficult to assess due to impaired cognition;Generalized weakness (B AKA)       Communication   Communication: HOH  Cognition Arousal/Alertness: Awake/alert Behavior During Therapy: Restless Overall Cognitive Status: History of cognitive impairments - at baseline                                 General Comments: Pt disoriented to situation, time, and place. Per chart review, this is the patient's baseline cognition      General Comments      Exercises Other Exercises Other Exercises: Pt completed multiple sidelying <> sit maneuvers to assist with bed linen change. Nurse notified of pt episode of urinary incontience and pt removal of condom catheter.   Assessment/Plan    PT Assessment Patent does not need any further PT services  PT Problem List         PT Treatment Interventions      PT Goals (Current goals can be found in the Care Plan section)  Acute Rehab PT Goals Patient Stated Goal: to go to LTC Time For Goal Achievement: 03/06/21    Frequency     Barriers to discharge        Co-evaluation               AM-PAC PT "6 Clicks" Mobility  Outcome Measure Help needed turning from your back to your side while in a flat bed without using bedrails?: None Help needed moving from lying on your back to sitting on the side of a flat bed without using bedrails?: None Help needed moving to and from a bed to a chair (including a wheelchair)?: Total Help needed standing up from a chair using your arms (e.g., wheelchair or bedside chair)?: Total Help needed to walk in hospital room?: Total Help needed climbing 3-5 steps with a railing? : Total 6 Click Score: 12    End of Session Equipment Utilized During Treatment: Oxygen Activity  Tolerance: Patient tolerated treatment well Patient left: in bed;with call bell/phone within reach Nurse Communication: Mobility status PT Visit Diagnosis: Muscle weakness (generalized) (M62.81)    Time: 7893-8101 PT Time Calculation (min) (ACUTE ONLY): 21 min   Charges:   PT Evaluation $PT Eval Low Complexity: 1 Low          Andrey Campanile, SPT   Andrey Campanile 03/06/2021, 3:17 PM

## 2021-03-06 NOTE — ED Notes (Signed)
Patient transported to MRI 

## 2021-03-06 NOTE — Progress Notes (Signed)
Sepsis tracking by eLINK 

## 2021-03-06 NOTE — Progress Notes (Signed)
Cross Cover Patient in a fib RVR rates 140-160's. Remains delirious with need for frequent redirection.   Chart and labs reviewed BNP, Mag and repeat BMP ordered Rate improved after 5 mg IV metoprolol No effect from haldol dose given earlier. Will  try increased  dose

## 2021-03-07 DIAGNOSIS — G9341 Metabolic encephalopathy: Secondary | ICD-10-CM | POA: Diagnosis not present

## 2021-03-07 LAB — BASIC METABOLIC PANEL
Anion gap: 12 (ref 5–15)
BUN: 21 mg/dL (ref 8–23)
CO2: 25 mmol/L (ref 22–32)
Calcium: 8.9 mg/dL (ref 8.9–10.3)
Chloride: 101 mmol/L (ref 98–111)
Creatinine, Ser: 1.49 mg/dL — ABNORMAL HIGH (ref 0.61–1.24)
GFR, Estimated: 46 mL/min — ABNORMAL LOW (ref >60)
Glucose, Bld: 149 mg/dL — ABNORMAL HIGH (ref 70–99)
Potassium: 4 mmol/L (ref 3.5–5.1)
Sodium: 138 mmol/L (ref 135–145)

## 2021-03-07 LAB — PHOSPHORUS: Phosphorus: 2.3 mg/dL — ABNORMAL LOW (ref 2.5–4.6)

## 2021-03-07 LAB — GLUCOSE, CAPILLARY
Glucose-Capillary: 122 mg/dL — ABNORMAL HIGH (ref 70–99)
Glucose-Capillary: 193 mg/dL — ABNORMAL HIGH (ref 70–99)
Glucose-Capillary: 212 mg/dL — ABNORMAL HIGH (ref 70–99)
Glucose-Capillary: 261 mg/dL — ABNORMAL HIGH (ref 70–99)
Glucose-Capillary: 161 mg/dL — ABNORMAL HIGH (ref 70–99)

## 2021-03-07 LAB — VITAMIN B12: Vitamin B-12: 808 pg/mL (ref 180–914)

## 2021-03-07 MED ORDER — HALOPERIDOL 1 MG PO TABS
1.0000 mg | ORAL_TABLET | Freq: Four times a day (QID) | ORAL | Status: DC | PRN
  Administered 2021-03-07 – 2021-03-08 (×2): 1 mg via ORAL
  Filled 2021-03-07 (×4): qty 1

## 2021-03-07 MED ORDER — ACETAMINOPHEN 160 MG/5ML PO SOLN
650.0000 mg | Freq: Four times a day (QID) | ORAL | Status: DC | PRN
  Filled 2021-03-07: qty 20.3

## 2021-03-07 MED ORDER — ASPIRIN 81 MG PO CHEW
81.0000 mg | CHEWABLE_TABLET | Freq: Every day | ORAL | Status: DC
  Administered 2021-03-08 – 2021-03-11 (×4): 81 mg via ORAL
  Filled 2021-03-07 (×5): qty 1

## 2021-03-07 MED ORDER — METOPROLOL SUCCINATE ER 50 MG PO TB24
50.0000 mg | ORAL_TABLET | Freq: Every day | ORAL | Status: DC
  Administered 2021-03-08 – 2021-03-10 (×3): 50 mg via ORAL
  Filled 2021-03-07 (×3): qty 1

## 2021-03-07 MED ORDER — DIVALPROEX SODIUM 125 MG PO CSDR
500.0000 mg | DELAYED_RELEASE_CAPSULE | Freq: Every day | ORAL | Status: DC
  Administered 2021-03-07 – 2021-03-11 (×5): 500 mg via ORAL
  Filled 2021-03-07 (×5): qty 4

## 2021-03-07 MED ORDER — FUROSEMIDE 10 MG/ML IJ SOLN
60.0000 mg | Freq: Once | INTRAMUSCULAR | Status: AC
  Administered 2021-03-07: 60 mg via INTRAVENOUS
  Filled 2021-03-07: qty 8

## 2021-03-07 NOTE — Plan of Care (Signed)

## 2021-03-07 NOTE — Progress Notes (Signed)
Pt refused all medications, including insulin. Informed pt about the risks associated with refusing medications. Pt is resting in bed with family at the bedside. Will notify MD and continue to monitor.

## 2021-03-07 NOTE — TOC Initial Note (Signed)
Transition of Care [facility]) - Initial/Assessment Note    Patient Details  Name: [name] MRN: [id number] Date of Birth: [birth year]  Transition of Care [facility]) CM/SW Contact:    Alberteen Sam, LCSW Phone Number: 03/07/2021, 1:45 PM  Clinical Narrative:                 CSW spoke with patient son [name] regarding discharge planning. Reports interested in long term placement and recognizes he/family may have to private pay, as currently we are not recommending SNF. [name] informed of [employer] resource to assist with long term private pay placement, reports agreeable for CSW to send referral.   Danielle with [employer] acknowledges referral and will reach out to son [name] to initiate placement search.   Expected Discharge Plan: [place] Barriers to Discharge: Continued Medical Work up   Patient Goals and CMS Choice Patient states their goals for this hospitalization and ongoing recovery are:: to go home CMS Medicare.gov Compare Post Acute Care list provided to:: Patient Represenative (must comment) (son) Choice offered to / list presented to : Adult Children  Expected Discharge Plan and Services Expected Discharge Plan: [place] arrangements for the past 2 months: Single Family Home                                      Prior Living Arrangements/Services Living arrangements for the past 2 months: Single Family Home Lives with:: Relatives                   Activities of Daily Living      Permission Sought/Granted                  Emotional Assessment         Alcohol / Substance Use: Not Applicable Psych Involvement: No (comment)  Admission diagnosis:  Bradycardia [R00.1] Mobitz II [I44.1] Hyperglycemia [R73.9] AKI (acute kidney injury) ([place]) [N17.9] Altered mental status, unspecified altered mental status type [R41.82] Hyperglycemia due to type 2 diabetes mellitus ([place])  [E11.65] Sepsis, due to unspecified organism, unspecified whether acute organ dysfunction present [facility]) [A41.9] Patient Active Problem List   Diagnosis Date Noted   Chronic systolic CHF (congestive heart failure) ([place]) 03/06/2021   Hyperglycemia due to type 2 diabetes mellitus ([place]) 11/10/2020   GERD (gastroesophageal reflux disease) 11/10/2020   Chest pain 11/09/2020   Acute encephalopathy 07/29/2020   Lactic acidosis 07/28/2020   Hypoxic 07/28/2020   Chronic prescription opiate use 07/28/2020   Elevated troponin 07/28/2020   Chronic anticoagulation 06/25/2020   Chronic kidney disease, stage 3a ([place]) 06/25/2020   AMS (altered mental status) 04/08/2020   CAD (coronary artery disease) 03/06/2020   Personal history of COVID-19 03/06/2020   Acute toxic-metabolic encephalopathy 71/24/5809   UTI (urinary tract infection) 03/06/2020   (HFpEF) heart failure with preserved ejection fraction ([place]) 03/06/2020   Goals of care, counseling/discussion    Palliative care by specialist    Seizure disorder ([place]) 11/30/2019   ACS (acute coronary syndrome) ([place]) 06/24/2019   Sinus bradycardia on ECG 05/16/2019   PAF (paroxysmal atrial fibrillation) ([place]) 03/27/2019   Hx of BKA, right ([place]) 03/02/2019   Severe protein-calorie malnutrition ([place]) 98/33/8250   Acute systolic CHF (congestive heart failure) ([place]) 02/16/2019   Acute respiratory failure with hypoxia ([place]) 02/08/2019   Pneumonia due to  COVID-19 virus 02/08/2019   Below-knee amputation of right lower extremity ([place]) 01/15/2019   Polyp of descending colon    Iron deficiency anemia due to chronic blood loss    Sacral decubitus ulcer, stage II ([place]) 12/26/2018   Acute respiratory failure ([place]) 12/20/2018   Femur fracture ([place]) 10/13/2018   Nonspecific chest pain 06/03/2018   NSTEMI (non-ST elevated myocardial infarction) ([place]) 01/16/2018   Acute CHF (congestive heart failure) ([place]) 11/23/2017   Atrial fibrillation with rapid ventricular  response ([place]) 10/15/2017   BKA stump complication ([place]) 46/28/6381   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 ([place]) 02/10/2017   S/P bilateral BKA (below knee amputation) ([place]) 02/01/2017   Chronic diastolic heart failure ([place]) 01/21/2017   Pressure injury of skin 01/18/2017   Atherosclerosis of artery of extremity with gangrene ([place]) 01/05/2017   Diabetic foot ulcer ([place]) 12/29/2016   Ataxia 10/21/2016   Atherosclerosis of native arteries of the extremities with ulceration ([place]) 10/12/2016   History of femoral angiogram 09/06/2016   Left leg pain 09/06/2016   Leukocytosis 09/06/2016   Generalized weakness 09/06/2016   AKI (acute kidney injury) ([place]) 09/06/2016   Atrial fibrillation, chronic ([place]) 08/30/2016   Pneumonia 05/19/2016   Hyperlipidemia 04/20/2016   Back pain 04/19/2016   DM (diabetes mellitus), type 2 with complications ([place]) 77/03/6578   Primary hypertension 04/19/2016   PAD (peripheral artery disease) ([place]) 04/19/2016   Erectile dysfunction following radical prostatectomy 06/25/2015   History of prostate cancer 12/23/2014   Incontinence 12/23/2014   PCP:  Corey Skains, MD Pharmacy:   [facility] [address], [place] - [place] [address] Phone: [phone] Fax: [phone]  [facility] [place], [place] [address] [place] [address] Phone: [phone] Fax: [place] [address] [place] [address] Phone: [phone] Fax: [phone]  [id number] [place], [place] [address] Phone: [phone] Fax: [phone]     Social Determinants of Health (SDOH) Interventions    Readmission Risk Interventions Readmission Risk Prevention Plan 03/07/2020 02/12/2019 12/25/2018  Transportation Screening Complete - Complete  PCP or Specialist Appt  within 3-5 Days Complete - Complete  HRI or Home Care Consult Complete - Complete  Social Work Consult for [place] Planning/Counseling Complete - Complete  Palliative Care Screening Not Applicable - Not Applicable  Medication Review Press photographer) Complete - Complete  PCP or Specialist appointment within 3-5 days of discharge - Not Complete -  PCP/Specialist Appt Not Complete comments - not ready for dc, family requests SNF placement -  HRI or Home Care Consult - Complete -  SW Recovery Care/Counseling Consult - Complete -  Palliative Care Screening - Not Applicable -  [place] - Complete -  Some recent data might be hidden

## 2021-03-07 NOTE — Progress Notes (Addendum)
PROGRESS NOTE    [name]  JIR:[id number] DOB: Dec 25, 1934 DOA: 03/06/2021 PCP: Corey Skains, MD  Outpatient Specialists: cardiology, vascular surgery    Brief Narrative:   From admission hpi: [name] is a 86 y.o. male with medical history significant for CAD, HTN, PAD s/p bilateral BKA, paroxysmal A. fib on Xarelto, systolic heart failure (EF 35 to 40% 12/14/2020), CKD 3A, seizure disorder, chronic pain on chronic opiates presenting by EMS with altered mental status and blood sugar readings in the 500s.  History is limited due to altered mental status and is taken mostly from ED provider.   ED course: On arrival, bradycardic 54-59 with otherwise normal vitals Blood work WBC 8.9, lactic acid 2.0> 1.6.  Blood glucose 499, creatinine 1.91 above baseline of 1.38, troponin 41.  Urinalysis unremarkable, COVID and flu negative   Assessment & Plan:   Principal Problem:   Acute toxic-metabolic encephalopathy Active Problems:   PAD (peripheral artery disease) (HCC)   PAF (paroxysmal atrial fibrillation) (HCC)   CAD (coronary artery disease)   Chronic kidney disease, stage 3a (HCC)   Lactic acidosis   Chronic prescription opiate use   Elevated troponin   Hyperglycemia due to type 2 diabetes mellitus (HCC)   Chronic systolic CHF (congestive heart failure) ([place])  # Delirium # Lactic acidosis Wife reports that patient's baseline is waxing and waning confusion, she says his current confusion is a bit off from the normal waxing and waning of his confusion. No signs infection, no electrolyte derangements. Is hyperglycemic. Presented with mildly elevated lactate, likely 2/2 dehydration - resolved. No report of seizure-like activity. On further discussion with daughter, mental status appears to be at baseline. Overnight was agitated, required prn haldol. MRI without acute findings. - delirium precautions - haldol prn; daily ekg - TOC to review long-term care  options w/ patient  # Hypokalemia # Hypomagnesemia Repleted overnight - monitor  # T2DM # Hyperglycemia Wife says she and husband are responsible for his meds but they are frequently confused, he doesn't get insulin as prescribed. Glucose improved with resumption of home insulin - resumed home lantus 20 bid, SSI  # CAD Trops mildly elevated and flat, do not think acs. Cath in 2021 showed stable multi-vessel disease - cont home statin, aspirin, imdur  # HFrEF Recent ef 35-40%. Appears euvolemic - cont home imdur - hold home metop and dilt given bradycardia, would resume metop first when hr improves  # AKI on ckd 3b AKI resolved with fluids - monitor  # HTN Here bp wnl - resume home imdur - restart chlorthalidone as needed - prn hydral - resuming home metop  # A-fib Chronic. Metop and dilt initially held 2/2 bradycardia. Had episode of asymptomatic rvr overnight. Received IV metop - eliquis as above - resume home metop. Add back dilt when bp and hr allow  # Seizure disorder Random depakote level low; there is concern for not taking meds as prescribed - home depakote  # PAD # b/l BKA - asa, statin, xarelto   DVT prophylaxis: xarelto Code Status: DNR, confirmed w/ daughter Family Communication: wife updated telephonically 10/14, daughter on 10/14. Son updated telephonically 10/15  Level of care: Progressive Cardiac Status is: Inpatient  Remains inpatient appropriate because: unsafe d/c plan    Consultants:  none  Procedures: none  Antimicrobials:  S/p cefepime    Subjective: Sleeping, rouses somewhat  Objective: Vitals:   03/07/21 0306 03/07/21 0315 03/07/21 0531 03/07/21 0804  BP: (!) 161/97  112/74 103/81  Pulse: 91  71 (!) 56  Resp: 18  18 18   Temp: 98.9 F (37.2 C)  98.4 F (36.9 C) 98.4 F (36.9 C)  SpO2: 94%  97% 96%  Weight:      Height:  5\' 7"  (1.702 m)      Intake/Output Summary (Last 24 hours) at 03/07/2021 1016 Last data  filed at 03/07/2021 0323 Gross per 24 hour  Intake 456.05 ml  Output 1000 ml  Net -543.95 ml   Filed Weights   03/06/21 0030  Weight: 84.1 kg    Examination:  General exam: sleeping, rouses a bit Respiratory system: Clear to auscultation. Respiratory effort normal. Cardiovascular system: S1 & S2 heard, RRR. No JVD, murmurs, rubs, gallops or clicks. No pedal edema. Gastrointestinal system: Abdomen is obese, soft and nontender. No organomegaly or masses felt. Normal bowel sounds heard. Central nervous system: drowsy, not oriented, moving all 4 estremities Extremities: b/l bka Skin: No rashes, lesions or ulcers Psychiatry: unable to assess    Data Reviewed: I have personally reviewed following labs and imaging studies  CBC: Recent Labs  Lab 03/06/21 0039  WBC 8.9  NEUTROABS 5.7  HGB 14.2  HCT 40.3  MCV 85.7  PLT 572   Basic Metabolic Panel: Recent Labs  Lab 03/06/21 0039 03/06/21 0836 03/06/21 2056 03/07/21 0604  NA 132* 134* 138 138  K 4.1 3.7 2.9* 4.0  CL 95* 97* 99 101  CO2 25 24 28 25   GLUCOSE 499* 419* 85 149*  BUN 31* 25* 24* 21  CREATININE 1.91* 1.45* 1.48* 1.49*  CALCIUM 8.9 8.6* 9.0 8.9  MG  --   --  1.5*  --   PHOS  --   --   --  2.3*   GFR: Estimated Creatinine Clearance: 38.9 mL/min (A) (by C-G formula based on SCr of 1.49 mg/dL (H)). Liver Function Tests: Recent Labs  Lab 03/06/21 0039  AST 22  ALT 19  ALKPHOS 104  BILITOT 1.0  PROT 6.7  ALBUMIN 3.5   No results for input(s): LIPASE, AMYLASE in the last 168 hours. No results for input(s): AMMONIA in the last 168 hours. Coagulation Profile: No results for input(s): INR, PROTIME in the last 168 hours. Cardiac Enzymes: No results for input(s): CKTOTAL, CKMB, CKMBINDEX, TROPONINI in the last 168 hours. BNP (last 3 results) No results for input(s): PROBNP in the last 8760 hours. HbA1C: Recent Labs    03/06/21 0039  HGBA1C 12.4*   CBG: Recent Labs  Lab 03/06/21 0839  03/06/21 1119 03/06/21 1709 03/07/21 0315 03/07/21 0804  GLUCAP 399* 375* 139* 122* 193*   Lipid Profile: No results for input(s): CHOL, HDL, LDLCALC, TRIG, CHOLHDL, LDLDIRECT in the last 72 hours. Thyroid Function Tests: No results for input(s): TSH, T4TOTAL, FREET4, T3FREE, THYROIDAB in the last 72 hours. Anemia Panel: No results for input(s): VITAMINB12, FOLATE, FERRITIN, TIBC, IRON, RETICCTPCT in the last 72 hours. Urine analysis:    Component Value Date/Time   COLORURINE YELLOW (A) 03/06/2021 0055   APPEARANCEUR HAZY (A) 03/06/2021 0055   LABSPEC 1.026 03/06/2021 0055   PHURINE 5.0 03/06/2021 0055   GLUCOSEU >=500 (A) 03/06/2021 0055   HGBUR NEGATIVE 03/06/2021 0055   BILIRUBINUR NEGATIVE 03/06/2021 0055   KETONESUR 5 (A) 03/06/2021 0055   PROTEINUR 30 (A) 03/06/2021 0055   NITRITE NEGATIVE 03/06/2021 0055   LEUKOCYTESUR NEGATIVE 03/06/2021 0055   Sepsis Labs: @LABRCNTIP (procalcitonin:4,lacticidven:4)  ) Recent Results (from the past 240 hour(s))  Resp Panel by RT-PCR (  Flu A&B, Covid) Nasopharyngeal Swab     Status: None   Collection Time: 03/06/21 12:55 AM   Specimen: Nasopharyngeal Swab; Nasopharyngeal(NP) swabs in vial transport medium  Result Value Ref Range Status   SARS Coronavirus 2 by RT PCR NEGATIVE NEGATIVE Final    Comment: (NOTE) SARS-CoV-2 target nucleic acids are NOT DETECTED.  The SARS-CoV-2 RNA is generally detectable in upper respiratory specimens during the acute phase of infection. The lowest concentration of SARS-CoV-2 viral copies this assay can detect is 138 copies/mL. A negative result does not preclude SARS-Cov-2 infection and should not be used as the sole basis for treatment or other patient management decisions. A negative result may occur with  improper specimen collection/handling, submission of specimen other than nasopharyngeal swab, presence of viral mutation(s) within the areas targeted by this assay, and inadequate number of  viral copies(<138 copies/mL). A negative result must be combined with clinical observations, patient history, and epidemiological information. The expected result is Negative.  Fact Sheet for Patients:  EntrepreneurPulse.com.au  Fact Sheet for Healthcare Providers:  IncredibleEmployment.be  This test is no t yet approved or cleared by the [place] FDA and  has been authorized for detection and/or diagnosis of SARS-CoV-2 by FDA under an Emergency Use Authorization (EUA). This EUA will remain  in effect (meaning this test can be used) for the duration of the COVID-19 declaration under Section 564(b)(1) of the Act, 21 U.S.C.section 360bbb-3(b)(1), unless the authorization is terminated  or revoked sooner.       Influenza A by PCR NEGATIVE NEGATIVE Final   Influenza B by PCR NEGATIVE NEGATIVE Final    Comment: (NOTE) The Xpert Xpress SARS-CoV-2/FLU/RSV plus assay is intended as an aid in the diagnosis of influenza from Nasopharyngeal swab specimens and should not be used as a sole basis for treatment. Nasal washings and aspirates are unacceptable for Xpert Xpress SARS-CoV-2/FLU/RSV testing.  Fact Sheet for Patients: EntrepreneurPulse.com.au  Fact Sheet for Healthcare Providers: IncredibleEmployment.be  This test is not yet approved or cleared by the [place] FDA and has been authorized for detection and/or diagnosis of SARS-CoV-2 by FDA under an Emergency Use Authorization (EUA). This EUA will remain in effect (meaning this test can be used) for the duration of the COVID-19 declaration under Section 564(b)(1) of the Act, 21 U.S.C. section 360bbb-3(b)(1), unless the authorization is terminated or revoked.  Performed at [facility], [address] [place], [place] [address]   Culture, blood (routine x 2)     Status: None (Preliminary result)   Collection Time: 03/06/21  1:54 AM    Specimen: BLOOD  Result Value Ref Range Status   Specimen Description BLOOD LEFT HAND  Final   Special Requests   Final    BOTTLES DRAWN AEROBIC AND ANAEROBIC Blood Culture adequate volume   Culture   Final    NO GROWTH 1 DAY Performed at [facility], [address], [place], [place] [address]    Report Status PENDING  Incomplete  Culture, blood (routine x 2)     Status: None (Preliminary result)   Collection Time: 03/06/21  1:56 AM   Specimen: BLOOD  Result Value Ref Range Status   Specimen Description BLOOD RIGHT HAND  Final   Special Requests   Final    BOTTLES DRAWN AEROBIC AND ANAEROBIC Blood Culture adequate volume   Culture   Final    NO GROWTH 1 DAY Performed at [facility], [address] [place], [place] [address]  Report Status PENDING  Incomplete         Radiology Studies: CT Head Wo Contrast  Result Date: 03/06/2021 CLINICAL DATA:  Encephalopathy EXAM: CT HEAD WITHOUT CONTRAST TECHNIQUE: Contiguous axial images were obtained from the base of the skull through the vertex without intravenous contrast. COMPARISON:  None. FINDINGS: Brain: There is no mass, hemorrhage or extra-axial collection. There is generalized atrophy without lobar predilection. Hypodensity of the white matter is most commonly associated with chronic microvascular disease. Vascular: Atherosclerotic calcification of the internal carotid arteries at the skull base. No abnormal hyperdensity of the major intracranial arteries or dural venous sinuses. Skull: The visualized skull base, calvarium and extracranial soft tissues are normal. Sinuses/Orbits: No fluid levels or advanced mucosal thickening of the visualized paranasal sinuses. No mastoid or middle ear effusion. The orbits are normal. IMPRESSION: Generalized atrophy and chronic microvascular ischemia without acute intracranial abnormality. Electronically Signed   By: Ulyses Jarred M.D.   On: 03/06/2021 01:44   MR BRAIN WO  CONTRAST  Result Date: 03/06/2021 CLINICAL DATA:  Delirium; encephalopathy. EXAM: MRI HEAD WITHOUT CONTRAST TECHNIQUE: Multiplanar, multiecho pulse sequences of the brain and surrounding structures were obtained without intravenous contrast. COMPARISON:  Prior head CT examinations 03/06/2021 and earlier. Brain MRI 06/26/2020. FINDINGS: Brain: The examination is intermittently motion degraded, limiting evaluation. Most notably, there is moderate motion degradation of the coronal diffusion-weighted sequence, moderate/severe motion degradation of the axial T2 FLAIR sequence, severe motion degradation of the axial T1 weighted sequence and moderate/severe motion degradation of the coronal T2 TSE sequence. Moderate/advanced generalized cerebral atrophy. Commensurate prominence of the ventricles and sulci. Comparatively mild cerebellar atrophy. Moderate multifocal T2 FLAIR hyperintense signal abnormality within the cerebral white matter, nonspecific but compatible with chronic small vessel ischemic disease. Redemonstrated chronic lacunar infarcts within the bilateral cerebellar hemispheres. A few scattered supratentorial chronic microhemorrhages are unchanged. There is no acute infarct. No evidence of an intracranial mass. No extra-axial fluid collection. No midline shift. Vascular: Maintained flow voids within the proximal large arterial vessels. Skull and upper cervical spine: No focal suspicious marrow lesion. Sinuses/Orbits: Visualized orbits show no acute finding. Bilateral lens replacements. Trace scattered paranasal sinus mucosal thickening at the imaged levels. IMPRESSION: Motion degraded and limited examination, as described. No evidence of acute intracranial abnormality. Moderate chronic small vessel ischemic changes within the cerebral white matter, stable as compared to the brain MRI of 06/26/2020. Redemonstrated chronic lacunar infarcts within the bilateral cerebellar hemispheres. A few scattered  supratentorial chronic microhemorrhages are unchanged. Moderate/advanced generalized cerebral atrophy. Comparatively mild cerebellar atrophy. Electronically Signed   By: Kellie Simmering D.O.   On: 03/06/2021 09:41   DG Chest Port 1 View  Result Date: 03/06/2021 CLINICAL DATA:  Hyperglycemia and confusion EXAM: PORTABLE CHEST 1 VIEW COMPARISON:  12/13/2020 FINDINGS: Diffuse mild interstitial coarsening, unchanged. No focal airspace consolidation or pulmonary edema no pleural effusion or pneumothorax. IMPRESSION: No active cardiopulmonary disease. Electronically Signed   By: Ulyses Jarred M.D.   On: 03/06/2021 01:08        Scheduled Meds:  aspirin EC  81 mg Oral Daily   atorvastatin  80 mg Oral QHS   divalproex  500 mg Oral Daily   insulin aspart  0-15 Units Subcutaneous TID WC   insulin aspart  0-5 Units Subcutaneous QHS   insulin aspart  6 Units Subcutaneous TID WC   insulin glargine-yfgn  20 Units Subcutaneous BID   isosorbide mononitrate  120 mg Oral Daily   metoprolol succinate  50 mg  Oral Daily   Rivaroxaban  15 mg Oral Daily   Continuous Infusions:   LOS: 1 day    Time spent: 30 min    Desma Maxim, MD [facility]   If 7PM-7AM, please contact night-coverage [email or web address] Password [email or web address] 03/07/2021, 10:16 AM

## 2021-03-08 DIAGNOSIS — G9341 Metabolic encephalopathy: Secondary | ICD-10-CM | POA: Diagnosis not present

## 2021-03-08 LAB — BASIC METABOLIC PANEL
Anion gap: 13 (ref 5–15)
BUN: 27 mg/dL — ABNORMAL HIGH (ref 8–23)
CO2: 27 mmol/L (ref 22–32)
Calcium: 8.9 mg/dL (ref 8.9–10.3)
Chloride: 100 mmol/L (ref 98–111)
Creatinine, Ser: 1.3 mg/dL — ABNORMAL HIGH (ref 0.61–1.24)
GFR, Estimated: 55 mL/min — ABNORMAL LOW (ref >60)
Glucose, Bld: 55 mg/dL — ABNORMAL LOW (ref 70–99)
Potassium: 3.3 mmol/L — ABNORMAL LOW (ref 3.5–5.1)
Sodium: 140 mmol/L (ref 135–145)

## 2021-03-08 LAB — GLUCOSE, CAPILLARY
Glucose-Capillary: 66 mg/dL — ABNORMAL LOW (ref 70–99)
Glucose-Capillary: 153 mg/dL — ABNORMAL HIGH (ref 70–99)
Glucose-Capillary: 155 mg/dL — ABNORMAL HIGH (ref 70–99)
Glucose-Capillary: 211 mg/dL — ABNORMAL HIGH (ref 70–99)

## 2021-03-08 LAB — MAGNESIUM: Magnesium: 2 mg/dL (ref 1.7–2.4)

## 2021-03-08 MED ORDER — POTASSIUM CHLORIDE CRYS ER 20 MEQ PO TBCR
60.0000 meq | EXTENDED_RELEASE_TABLET | Freq: Once | ORAL | Status: AC
  Administered 2021-03-08: 60 meq via ORAL
  Filled 2021-03-08: qty 3

## 2021-03-08 MED ORDER — DILTIAZEM HCL ER COATED BEADS 120 MG PO CP24
120.0000 mg | ORAL_CAPSULE | Freq: Every day | ORAL | Status: DC
  Administered 2021-03-08 – 2021-03-11 (×4): 120 mg via ORAL
  Filled 2021-03-08 (×4): qty 1

## 2021-03-08 MED ORDER — INSULIN ASPART 100 UNIT/ML IJ SOLN: 3.0000 [IU] | Freq: Three times a day (TID) | INTRAMUSCULAR | Status: DC

## 2021-03-08 MED ORDER — INSULIN GLARGINE-YFGN 100 UNIT/ML ~~LOC~~ SOLN
10.0000 [IU] | Freq: Two times a day (BID) | SUBCUTANEOUS | Status: DC
  Administered 2021-03-08 – 2021-03-10 (×4): 10 [IU] via SUBCUTANEOUS
  Filled 2021-03-08 (×5): qty 0.1

## 2021-03-08 NOTE — Plan of Care (Signed)

## 2021-03-08 NOTE — Progress Notes (Signed)
PROGRESS NOTE    [name]  WLN:[id number] DOB: August 25, 1934 DOA: 03/06/2021 PCP: Corey Skains, MD  Outpatient Specialists: cardiology, vascular surgery    Brief Narrative:   From admission hpi: [name] is a 86 y.o. male with medical history significant for CAD, HTN, PAD s/p bilateral BKA, paroxysmal A. fib on Xarelto, systolic heart failure (EF 35 to 40% 12/14/2020), CKD 3A, seizure disorder, chronic pain on chronic opiates presenting by EMS with altered mental status and blood sugar readings in the 500s.  History is limited due to altered mental status and is taken mostly from ED provider.   ED course: On arrival, bradycardic 54-59 with otherwise normal vitals Blood work WBC 8.9, lactic acid 2.0> 1.6.  Blood glucose 499, creatinine 1.91 above baseline of 1.38, troponin 41.  Urinalysis unremarkable, COVID and flu negative   Assessment & Plan:   Principal Problem:   Acute toxic-metabolic encephalopathy Active Problems:   PAD (peripheral artery disease) (HCC)   PAF (paroxysmal atrial fibrillation) (HCC)   CAD (coronary artery disease)   Chronic kidney disease, stage 3a (HCC)   Lactic acidosis   Chronic prescription opiate use   Elevated troponin   Hyperglycemia due to type 2 diabetes mellitus (HCC)   Chronic systolic CHF (congestive heart failure) ([place])  # Delirium # Lactic acidosis Wife reports that patient's baseline is waxing and waning confusion, she says his current confusion is a bit off from the normal waxing and waning of his confusion. No signs infection, no electrolyte derangements. Is hyperglycemic. Elevated lactate resolved. No report of seizure-like activity. On further discussion with daughter, mental status appears to be at baseline. No agitation overnight, this morning much more calm and focused, all consistent w/ delirium - delirium precautions - hold haldol, agitation appears to be resolved today - needs long-term care, TOC has  shared care patrol resource w/ family.   # Hypokalemia # Hypomagnesemia Mg wnl today, k 3.3 - kcl 60 meq - monitor  # T2DM # Hyperglycemia Wife says she and husband are responsible for his meds but they are frequently confused, he doesn't get insulin as prescribed. Glucose improved with resumption of home insulin. This morning hypoglycemic, this appears to have resolved - decrease lantus from 20 bid to 10 bid. Stop mealtime, continue ssi  # CAD Trops mildly elevated and flat, do not think acs. Cath in 2021 showed stable multi-vessel disease - cont home statin, aspirin, imdur  # HFrEF Recent ef 35-40%. Appears euvolemic - cont home imdur - hold home metop and dilt given bradycardia, would resume metop first when hr improves  # AKI on ckd 3b AKI resolved with fluids - monitor  # HTN Here bp wnl - resumed home imdur - restart chlorthalidone as needed - prn hydral - resumed home metop  # A-fib Chronic. Metop and dilt initially held 2/2 bradycardia. Had episode of asymptomatic rvr overnight 10/15 - eliquis as above - resume home metop. Will add back dilt today  # Seizure disorder Random depakote level low; there is concern for not taking meds as prescribed - home depakote  # PAD # b/l BKA - asa, statin, xarelto   DVT prophylaxis: xarelto Code Status: DNR, confirmed w/ daughter Family Communication: wife updated telephonically 10/14, daughter on 10/14. Son updated telephonically 10/15  Level of care: Progressive Cardiac Status is: Inpatient  Remains inpatient appropriate because: unsafe d/c plan    Consultants:  none  Procedures: none  Antimicrobials:  S/p cefepime    Subjective:  Awake and alert, calm, ordering lunch, no complaints  Objective: Vitals:   03/07/21 2047 03/08/21 0031 03/08/21 0333 03/08/21 0751  BP: 129/77 121/67 109/81 (!) 133/92  Pulse: (!) 45 68 93 (!) 104  Resp: 18 16 19 17   Temp: 98.2 F (36.8 C) 98.5 F (36.9 C) 98.3 F  (36.8 C) 98.7 F (37.1 C)  SpO2: 98% 98% 93% 93%  Weight:      Height:        Intake/Output Summary (Last 24 hours) at 03/08/2021 1108 Last data filed at 03/07/2021 1855 Gross per 24 hour  Intake 480 ml  Output 800 ml  Net -320 ml   Filed Weights   03/06/21 0030  Weight: 84.1 kg    Examination:  General exam: nad, awake and alert Respiratory system: Clear to auscultation. Respiratory effort normal. Cardiovascular system: S1 & S2 heard, RRR. No JVD, murmurs, rubs, gallops or clicks. No pedal edema. Gastrointestinal system: Abdomen is obese, soft and nontender. No organomegaly or masses felt. Normal bowel sounds heard. Central nervous system: drowsy, not oriented, moving all 4 estremities Extremities: b/l bka Skin: No rashes, lesions or ulcers Psychiatry: calm, oriented to self    Data Reviewed: I have personally reviewed following labs and imaging studies  CBC: Recent Labs  Lab 03/06/21 0039  WBC 8.9  NEUTROABS 5.7  HGB 14.2  HCT 40.3  MCV 85.7  PLT 650   Basic Metabolic Panel: Recent Labs  Lab 03/06/21 0039 03/06/21 0836 03/06/21 2056 03/07/21 0604 03/08/21 0613  NA 132* 134* 138 138 140  K 4.1 3.7 2.9* 4.0 3.3*  CL 95* 97* 99 101 100  CO2 25 24 28 25 27   GLUCOSE 499* 419* 85 149* 55*  BUN 31* 25* 24* 21 27*  CREATININE 1.91* 1.45* 1.48* 1.49* 1.30*  CALCIUM 8.9 8.6* 9.0 8.9 8.9  MG  --   --  1.5*  --  2.0  PHOS  --   --   --  2.3*  --    GFR: Estimated Creatinine Clearance: 44.6 mL/min (A) (by C-G formula based on SCr of 1.3 mg/dL (H)). Liver Function Tests: Recent Labs  Lab 03/06/21 0039  AST 22  ALT 19  ALKPHOS 104  BILITOT 1.0  PROT 6.7  ALBUMIN 3.5   No results for input(s): LIPASE, AMYLASE in the last 168 hours. No results for input(s): AMMONIA in the last 168 hours. Coagulation Profile: No results for input(s): INR, PROTIME in the last 168 hours. Cardiac Enzymes: No results for input(s): CKTOTAL, CKMB, CKMBINDEX, TROPONINI in  the last 168 hours. BNP (last 3 results) No results for input(s): PROBNP in the last 8760 hours. HbA1C: Recent Labs    03/06/21 0039  HGBA1C 12.4*   CBG: Recent Labs  Lab 03/07/21 0804 03/07/21 1216 03/07/21 1608 03/07/21 2047 03/08/21 0752  GLUCAP 193* 212* 261* 161* 66*   Lipid Profile: No results for input(s): CHOL, HDL, LDLCALC, TRIG, CHOLHDL, LDLDIRECT in the last 72 hours. Thyroid Function Tests: No results for input(s): TSH, T4TOTAL, FREET4, T3FREE, THYROIDAB in the last 72 hours. Anemia Panel: Recent Labs    03/07/21 0604  VITAMINB12 808   Urine analysis:    Component Value Date/Time   COLORURINE YELLOW (A) 03/06/2021 0055   APPEARANCEUR HAZY (A) 03/06/2021 0055   LABSPEC 1.026 03/06/2021 0055   PHURINE 5.0 03/06/2021 0055   GLUCOSEU >=500 (A) 03/06/2021 0055   HGBUR NEGATIVE 03/06/2021 0055   BILIRUBINUR NEGATIVE 03/06/2021 0055   KETONESUR 5 (A) 03/06/2021  [place] (A) 03/06/2021 0055   NITRITE NEGATIVE 03/06/2021 0055   LEUKOCYTESUR NEGATIVE 03/06/2021 0055   Sepsis Labs: @LABRCNTIP (procalcitonin:4,lacticidven:4)  ) Recent Results (from the past 240 hour(s))  Resp Panel by RT-PCR (Flu A&B, Covid) Nasopharyngeal Swab     Status: None   Collection Time: 03/06/21 12:55 AM   Specimen: Nasopharyngeal Swab; Nasopharyngeal(NP) swabs in vial transport medium  Result Value Ref Range Status   SARS Coronavirus 2 by RT PCR NEGATIVE NEGATIVE Final    Comment: (NOTE) SARS-CoV-2 target nucleic acids are NOT DETECTED.  The SARS-CoV-2 RNA is generally detectable in upper respiratory specimens during the acute phase of infection. The lowest concentration of SARS-CoV-2 viral copies this assay can detect is 138 copies/mL. A negative result does not preclude SARS-Cov-2 infection and should not be used as the sole basis for treatment or other patient management decisions. A negative result may occur with  improper specimen collection/handling, submission  of specimen other than nasopharyngeal swab, presence of viral mutation(s) within the areas targeted by this assay, and inadequate number of viral copies(<138 copies/mL). A negative result must be combined with clinical observations, patient history, and epidemiological information. The expected result is Negative.  Fact Sheet for Patients:  EntrepreneurPulse.com.au  Fact Sheet for Healthcare Providers:  IncredibleEmployment.be  This test is no t yet approved or cleared by the [place] FDA and  has been authorized for detection and/or diagnosis of SARS-CoV-2 by FDA under an Emergency Use Authorization (EUA). This EUA will remain  in effect (meaning this test can be used) for the duration of the COVID-19 declaration under Section 564(b)(1) of the Act, 21 U.S.C.section 360bbb-3(b)(1), unless the authorization is terminated  or revoked sooner.       Influenza A by PCR NEGATIVE NEGATIVE Final   Influenza B by PCR NEGATIVE NEGATIVE Final    Comment: (NOTE) The Xpert Xpress SARS-CoV-2/FLU/RSV plus assay is intended as an aid in the diagnosis of influenza from Nasopharyngeal swab specimens and should not be used as a sole basis for treatment. Nasal washings and aspirates are unacceptable for Xpert Xpress SARS-CoV-2/FLU/RSV testing.  Fact Sheet for Patients: EntrepreneurPulse.com.au  Fact Sheet for Healthcare Providers: IncredibleEmployment.be  This test is not yet approved or cleared by the [place] FDA and has been authorized for detection and/or diagnosis of SARS-CoV-2 by FDA under an Emergency Use Authorization (EUA). This EUA will remain in effect (meaning this test can be used) for the duration of the COVID-19 declaration under Section 564(b)(1) of the Act, 21 U.S.C. section 360bbb-3(b)(1), unless the authorization is terminated or revoked.  Performed at [facility], [address] [place], [place] [address]   Culture, blood (routine x 2)     Status: None (Preliminary result)   Collection Time: 03/06/21  1:54 AM   Specimen: BLOOD  Result Value Ref Range Status   Specimen Description BLOOD LEFT HAND  Final   Special Requests   Final    BOTTLES DRAWN AEROBIC AND ANAEROBIC Blood Culture adequate volume   Culture   Final    NO GROWTH 2 DAYS Performed at [facility], [address] [place], [place] [address]    Report Status PENDING  Incomplete  Culture, blood (routine x 2)     Status: None (Preliminary result)   Collection Time: 03/06/21  1:56 AM   Specimen: BLOOD  Result Value Ref Range Status   Specimen Description BLOOD RIGHT HAND  Final   Special Requests   Final  BOTTLES DRAWN AEROBIC AND ANAEROBIC Blood Culture adequate volume   Culture   Final    NO GROWTH 2 DAYS Performed at [facility], [address] [place], [place] [address]    Report Status PENDING  Incomplete         Radiology Studies: No results found.      Scheduled Meds:  aspirin  81 mg Oral Daily   atorvastatin  80 mg Oral QHS   divalproex  500 mg Oral Daily   insulin aspart  0-15 Units Subcutaneous TID WC   insulin aspart  0-5 Units Subcutaneous QHS   insulin glargine-yfgn  10 Units Subcutaneous BID   isosorbide mononitrate  120 mg Oral Daily   metoprolol succinate  50 mg Oral Daily   Rivaroxaban  15 mg Oral Daily   Continuous Infusions:   LOS: 2 days    Time spent: 30 min    Desma Maxim, MD [facility]   If 7PM-7AM, please contact night-coverage [email or web address] Password [email or web address] 03/08/2021, 11:08 AM

## 2021-03-09 DIAGNOSIS — G9341 Metabolic encephalopathy: Secondary | ICD-10-CM | POA: Diagnosis not present

## 2021-03-09 LAB — MAGNESIUM: Magnesium: 2.1 mg/dL (ref 1.7–2.4)

## 2021-03-09 LAB — BASIC METABOLIC PANEL
Anion gap: 9 (ref 5–15)
BUN: 37 mg/dL — ABNORMAL HIGH (ref 8–23)
CO2: 25 mmol/L (ref 22–32)
Calcium: 8.9 mg/dL (ref 8.9–10.3)
Chloride: 104 mmol/L (ref 98–111)
Creatinine, Ser: 1.61 mg/dL — ABNORMAL HIGH (ref 0.61–1.24)
GFR, Estimated: 42 mL/min — ABNORMAL LOW (ref >60)
Glucose, Bld: 81 mg/dL (ref 70–99)
Potassium: 4.7 mmol/L (ref 3.5–5.1)
Sodium: 138 mmol/L (ref 135–145)

## 2021-03-09 LAB — GLUCOSE, CAPILLARY
Glucose-Capillary: 78 mg/dL (ref 70–99)
Glucose-Capillary: 320 mg/dL — ABNORMAL HIGH (ref 70–99)
Glucose-Capillary: 298 mg/dL — ABNORMAL HIGH (ref 70–99)
Glucose-Capillary: 151 mg/dL — ABNORMAL HIGH (ref 70–99)

## 2021-03-09 NOTE — Care Management Important Message (Signed)
Important Message  Patient Details  Name: [name] MRN: [id number] Date of Birth: 16-May-1935   Medicare Important Message Given:  Yes     Dannette Barbara 03/09/2021, 2:27 PM

## 2021-03-09 NOTE — Progress Notes (Signed)
Occupational Therapy Treatment Patient Details Name: [name] MRN: [id number] DOB: Jan 23, 1935 Today's Date: 03/09/2021   History of present illness [name] is a 86 y.o. male with medical history significant for CAD, HTN, PAD s/p bilateral BKA, paroxysmal A. fib on Xarelto, systolic heart failure (EF 35 to 40% 12/14/2020), CKD 3A, seizure disorder, chronic pain on chronic opiates presenting by EMS with altered mental status and blood sugar readings in the 500s.  History is limited due to altered mental status.   OT comments  [name] was seen for OT treatment on this date. Upon arrival to room pt awake/alert and yelling for help to call his wife. Pt seated upright in bed. Pt did not know when he was admitted to the hospital or the current date/month. Pt agreeable to tx. Pt performed therapeutic exercises with red level thera band. Pt return demonstrations with MOD multimodal cueing. Pt performed 3x 10 chest pulls, shoulder elevation, alternating punches, w/ pt taking rest breaks prn. Pt making good progress toward goals. Pt continues to benefit from skilled OT services to maximize return to PLOF and minimize risk of future falls, injury, caregiver burden, and readmission. Will continue to follow POC. Discharge recommendation remains appropriate.     Recommendations for follow up therapy are one component of a multi-disciplinary discharge planning process, led by the attending physician.  Recommendations may be updated based on patient status, additional functional criteria and insurance authorization.    Follow Up Recommendations  No OT follow up;Other (comment)    Equipment Recommendations  None recommended by OT    Recommendations for Other Services      Precautions / Restrictions Precautions Precautions: Fall Restrictions Weight Bearing Restrictions: No       Balance Overall balance assessment: Needs assistance Sitting-balance support: No upper  extremity supported Sitting balance-Leahy Scale: Good                                     ADL either performed or assessed with clinical judgement   ADL Overall ADL's : At baseline                                              Cognition Arousal/Alertness: Awake/alert   Overall Cognitive Status: History of cognitive impairments - at baseline                                          Exercises Other Exercises Other Exercises: Pt educated re: OT role, importance of mvmt for functional mobility, tech for exercises   Shoulder Instructions       General Comments      Pertinent Vitals/ Pain       Pain Assessment: No/denies pain Pain Score: 0-No pain  Home Living                                          Prior Functioning/Environment              Frequency  Min 2X/week        Progress Toward Goals  OT Goals(current goals can  now be found in the care plan section)  Progress towards OT goals: Progressing toward goals  Acute Rehab OT Goals Patient Stated Goal: to go to LTC OT Goal Formulation: With family Time For Goal Achievement: 03/20/21 Potential to Achieve Goals: Fair ADL Goals Pt Will Transfer to Toilet: anterior/posterior transfer;bedside commode;with min assist Pt/caregiver will Perform Home Exercise Program: With theraband;Increased strength;Both right and left upper extremity;With written HEP provided;With Supervision  Plan Discharge plan remains appropriate    Co-evaluation                 AM-PAC OT "6 Clicks" Daily Activity     Outcome Measure   Help from another person eating meals?: None Help from another person taking care of personal grooming?: None Help from another person toileting, which includes using toliet, bedpan, or urinal?: A Little Help from another person bathing (including washing, rinsing, drying)?: A Little Help from another person to put on and taking  off regular upper body clothing?: None Help from another person to put on and taking off regular lower body clothing?: A Little 6 Click Score: 21    End of Session Equipment Utilized During Treatment: Other (comment) (red theraband)  OT Visit Diagnosis: Muscle weakness (generalized) (M62.81)   Activity Tolerance Patient tolerated treatment well   Patient Left in bed;with nursing/sitter in room   Nurse Communication          Time: 1400-1420 OT Time Calculation (min): 20 min  Charges: OT General Charges $OT Visit: 1 Visit OT Treatments $Therapeutic Exercise: 8-22 mins  Nino Glow, Markus Daft 03/09/2021, 4:02 PM

## 2021-03-09 NOTE — Progress Notes (Addendum)
PROGRESS NOTE    [name]  JSE:[id number] DOB: 01-02-1935 DOA: 03/06/2021 PCP: Corey Skains, MD  Outpatient Specialists: cardiology, vascular surgery    Brief Narrative:   From admission hpi: [name] is a 86 y.o. male with medical history significant for CAD, HTN, PAD s/p bilateral BKA, paroxysmal A. fib on Xarelto, systolic heart failure (EF 35 to 40% 12/14/2020), CKD 3A, seizure disorder, chronic pain on chronic opiates presenting by EMS with altered mental status and blood sugar readings in the 500s.  History is limited due to altered mental status and is taken mostly from ED provider.   ED course: On arrival, bradycardic 54-59 with otherwise normal vitals Blood work WBC 8.9, lactic acid 2.0> 1.6.  Blood glucose 499, creatinine 1.91 above baseline of 1.38, troponin 41.  Urinalysis unremarkable, COVID and flu negative   Assessment & Plan:   Principal Problem:   Acute toxic-metabolic encephalopathy Active Problems:   PAD (peripheral artery disease) (HCC)   PAF (paroxysmal atrial fibrillation) (HCC)   CAD (coronary artery disease)   Chronic kidney disease, stage 3a (HCC)   Lactic acidosis   Chronic prescription opiate use   Elevated troponin   Hyperglycemia due to type 2 diabetes mellitus (HCC)   Chronic systolic CHF (congestive heart failure) ([place])  # Delirium # Lactic acidosis Wife reports that patient's baseline is waxing and waning confusion, she says his current confusion is a bit off from the normal waxing and waning of his confusion. No signs infection, no electrolyte derangements. Was hyperglycemic on presentation. Elevated lactate resolved. No report of seizure-like activity. On further discussion with daughter, mental status appears to be at baseline. No agitation overnight, this morning much more calm and focused, all consistent w/ delirium - delirium precautions - hold haldol, agitation appears to be resolved today - needs long-term  care, TOC has shared care patrol resource w/ family. TOC to touch base w/ family today. TOC says we may have placement determined as early as tomorrow  # Hypokalemia # Hypomagnesemia Resolved - monitor  # T2DM # Hyperglycemia Wife says she and husband are responsible for his meds but they are frequently confused, he doesn't get insulin as prescribed. Glucose improved with resumption of home insulin. This morning euglycemic - have decreased home lantus from 20 bid to 10 bid. Stopped mealtime, continue ssi  # CAD Trops mildly elevated and flat, do not think acs. Cath in 2021 showed stable multi-vessel disease - cont home statin, aspirin, imdur  # HFrEF Recent ef 35-40%. Appears euvolemic - cont home imdur - have resumed home metop and dilt  # AKI on ckd 3b AKI resolved with fluids - monitor  # HTN Here bp wnl - resumed home imdur - restart chlorthalidone as needed - prn hydral - resumed home metop and dilt  # A-fib Chronic. Metop and dilt initially held 2/2 bradycardia. Had episode of asymptomatic rvr overnight 10/15 - eliquis as above - resumed home metop and dilt  # Seizure disorder Random depakote level low; there is concern for not taking meds as prescribed - home depakote  # PAD # b/l BKA - asa, statin, xarelto   DVT prophylaxis: xarelto Code Status: DNR, confirmed w/ daughter Family Communication: wife updated telephonically 10/14, daughter on 10/14. Son updated telephonically 10/15  Level of care: Progressive Cardiac Status is: Inpatient  Remains inpatient appropriate because: unsafe d/c plan    Consultants:  none  Procedures: none  Antimicrobials:  S/p cefepime    Subjective: Awake  and alert, calm, no complaints  Objective: Vitals:   03/09/21 0108 03/09/21 0454 03/09/21 0724 03/09/21 1055  BP: 106/60 95/66 129/64 127/71  Pulse: 74 67 74 78  Resp: 18 18 17 17   Temp: 97.6 F (36.4 C) 97.7 F (36.5 C) 98 F (36.7 C) 97.8 F (36.6 C)   TempSrc:      SpO2: 94% 92% 94% 97%  Weight:      Height:        Intake/Output Summary (Last 24 hours) at 03/09/2021 1339 Last data filed at 03/09/2021 1033 Gross per 24 hour  Intake 1080 ml  Output 600 ml  Net 480 ml   Filed Weights   03/06/21 0030  Weight: 84.1 kg    Examination:  General exam: nad, awake and alert Respiratory system: Clear to auscultation. Respiratory effort normal. Cardiovascular system: S1 & S2 heard, RRR. No JVD, murmurs, rubs, gallops or clicks. No pedal edema. Gastrointestinal system: Abdomen is obese, soft and nontender. No organomegaly or masses felt. Normal bowel sounds heard. Central nervous system: drowsy, not oriented, moving all 4 estremities Extremities: b/l bka Skin: No rashes, lesions or ulcers Psychiatry: calm, oriented to self    Data Reviewed: I have personally reviewed following labs and imaging studies  CBC: Recent Labs  Lab 03/06/21 0039  WBC 8.9  NEUTROABS 5.7  HGB 14.2  HCT 40.3  MCV 85.7  PLT 287   Basic Metabolic Panel: Recent Labs  Lab 03/06/21 0836 03/06/21 2056 03/07/21 0604 03/08/21 0613 03/09/21 0551  NA 134* 138 138 140 138  K 3.7 2.9* 4.0 3.3* 4.7  CL 97* 99 101 100 104  CO2 24 28 25 27 25   GLUCOSE 419* 85 149* 55* 81  BUN 25* 24* 21 27* 37*  CREATININE 1.45* 1.48* 1.49* 1.30* 1.61*  CALCIUM 8.6* 9.0 8.9 8.9 8.9  MG  --  1.5*  --  2.0 2.1  PHOS  --   --  2.3*  --   --    GFR: Estimated Creatinine Clearance: 36 mL/min (A) (by C-G formula based on SCr of 1.61 mg/dL (H)). Liver Function Tests: Recent Labs  Lab 03/06/21 0039  AST 22  ALT 19  ALKPHOS 104  BILITOT 1.0  PROT 6.7  ALBUMIN 3.5   No results for input(s): LIPASE, AMYLASE in the last 168 hours. No results for input(s): AMMONIA in the last 168 hours. Coagulation Profile: No results for input(s): INR, PROTIME in the last 168 hours. Cardiac Enzymes: No results for input(s): CKTOTAL, CKMB, CKMBINDEX, TROPONINI in the last 168  hours. BNP (last 3 results) No results for input(s): PROBNP in the last 8760 hours. HbA1C: No results for input(s): HGBA1C in the last 72 hours.  CBG: Recent Labs  Lab 03/08/21 1128 03/08/21 1651 03/08/21 2052 03/09/21 0726 03/09/21 1239  GLUCAP 153* 155* 211* 78 320*   Lipid Profile: No results for input(s): CHOL, HDL, LDLCALC, TRIG, CHOLHDL, LDLDIRECT in the last 72 hours. Thyroid Function Tests: No results for input(s): TSH, T4TOTAL, FREET4, T3FREE, THYROIDAB in the last 72 hours. Anemia Panel: Recent Labs    03/07/21 0604  VITAMINB12 808   Urine analysis:    Component Value Date/Time   COLORURINE YELLOW (A) 03/06/2021 0055   APPEARANCEUR HAZY (A) 03/06/2021 0055   LABSPEC 1.026 03/06/2021 0055   PHURINE 5.0 03/06/2021 0055   GLUCOSEU >=500 (A) 03/06/2021 0055   HGBUR NEGATIVE 03/06/2021 0055   BILIRUBINUR NEGATIVE 03/06/2021 0055   KETONESUR 5 (A) 03/06/2021 0055  PROTEINUR 30 (A) 03/06/2021 0055   NITRITE NEGATIVE 03/06/2021 0055   LEUKOCYTESUR NEGATIVE 03/06/2021 0055   Sepsis Labs: @LABRCNTIP (procalcitonin:4,lacticidven:4)  ) Recent Results (from the past 240 hour(s))  Resp Panel by RT-PCR (Flu A&B, Covid) Nasopharyngeal Swab     Status: None   Collection Time: 03/06/21 12:55 AM   Specimen: Nasopharyngeal Swab; Nasopharyngeal(NP) swabs in vial transport medium  Result Value Ref Range Status   SARS Coronavirus 2 by RT PCR NEGATIVE NEGATIVE Final    Comment: (NOTE) SARS-CoV-2 target nucleic acids are NOT DETECTED.  The SARS-CoV-2 RNA is generally detectable in upper respiratory specimens during the acute phase of infection. The lowest concentration of SARS-CoV-2 viral copies this assay can detect is 138 copies/mL. A negative result does not preclude SARS-Cov-2 infection and should not be used as the sole basis for treatment or other patient management decisions. A negative result may occur with  improper specimen collection/handling, submission of  specimen other than nasopharyngeal swab, presence of viral mutation(s) within the areas targeted by this assay, and inadequate number of viral copies(<138 copies/mL). A negative result must be combined with clinical observations, patient history, and epidemiological information. The expected result is Negative.  Fact Sheet for Patients:  EntrepreneurPulse.com.au  Fact Sheet for Healthcare Providers:  IncredibleEmployment.be  This test is no t yet approved or cleared by the [place] FDA and  has been authorized for detection and/or diagnosis of SARS-CoV-2 by FDA under an Emergency Use Authorization (EUA). This EUA will remain  in effect (meaning this test can be used) for the duration of the COVID-19 declaration under Section 564(b)(1) of the Act, 21 U.S.C.section 360bbb-3(b)(1), unless the authorization is terminated  or revoked sooner.       Influenza A by PCR NEGATIVE NEGATIVE Final   Influenza B by PCR NEGATIVE NEGATIVE Final    Comment: (NOTE) The Xpert Xpress SARS-CoV-2/FLU/RSV plus assay is intended as an aid in the diagnosis of influenza from Nasopharyngeal swab specimens and should not be used as a sole basis for treatment. Nasal washings and aspirates are unacceptable for Xpert Xpress SARS-CoV-2/FLU/RSV testing.  Fact Sheet for Patients: EntrepreneurPulse.com.au  Fact Sheet for Healthcare Providers: IncredibleEmployment.be  This test is not yet approved or cleared by the [place] FDA and has been authorized for detection and/or diagnosis of SARS-CoV-2 by FDA under an Emergency Use Authorization (EUA). This EUA will remain in effect (meaning this test can be used) for the duration of the COVID-19 declaration under Section 564(b)(1) of the Act, 21 U.S.C. section 360bbb-3(b)(1), unless the authorization is terminated or revoked.  Performed at [facility], [address] [place], [place] [address]   Culture, blood (routine x 2)     Status: None (Preliminary result)   Collection Time: 03/06/21  1:54 AM   Specimen: BLOOD  Result Value Ref Range Status   Specimen Description BLOOD LEFT HAND  Final   Special Requests   Final    BOTTLES DRAWN AEROBIC AND ANAEROBIC Blood Culture adequate volume   Culture   Final    NO GROWTH 3 DAYS Performed at [facility], [address], [place], [place] [address]    Report Status PENDING  Incomplete  Culture, blood (routine x 2)     Status: None (Preliminary result)   Collection Time: 03/06/21  1:56 AM   Specimen: BLOOD  Result Value Ref Range Status   Specimen Description BLOOD RIGHT HAND  Final   Special Requests   Final  BOTTLES DRAWN AEROBIC AND ANAEROBIC Blood Culture adequate volume   Culture   Final    NO GROWTH 3 DAYS Performed at [facility], [address] [place], [place] [address]    Report Status PENDING  Incomplete         Radiology Studies: No results found.      Scheduled Meds:  aspirin  81 mg Oral Daily   atorvastatin  80 mg Oral QHS   diltiazem  120 mg Oral Daily   divalproex  500 mg Oral Daily   insulin aspart  0-15 Units Subcutaneous TID WC   insulin aspart  0-5 Units Subcutaneous QHS   insulin glargine-yfgn  10 Units Subcutaneous BID   isosorbide mononitrate  120 mg Oral Daily   metoprolol succinate  50 mg Oral Daily   Rivaroxaban  15 mg Oral Daily   Continuous Infusions:   LOS: 3 days    Time spent: 20 min    Desma Maxim, MD [facility]   If 7PM-7AM, please contact night-coverage [email or web address] Password [email or web address] 03/09/2021, 1:39 PM

## 2021-03-10 DIAGNOSIS — G9341 Metabolic encephalopathy: Secondary | ICD-10-CM | POA: Diagnosis not present

## 2021-03-10 LAB — GLUCOSE, CAPILLARY
Glucose-Capillary: 181 mg/dL — ABNORMAL HIGH (ref 70–99)
Glucose-Capillary: 251 mg/dL — ABNORMAL HIGH (ref 70–99)
Glucose-Capillary: 230 mg/dL — ABNORMAL HIGH (ref 70–99)
Glucose-Capillary: 186 mg/dL — ABNORMAL HIGH (ref 70–99)

## 2021-03-10 LAB — BASIC METABOLIC PANEL
Anion gap: 4 — ABNORMAL LOW (ref 5–15)
BUN: 38 mg/dL — ABNORMAL HIGH (ref 8–23)
CO2: 27 mmol/L (ref 22–32)
Calcium: 8.1 mg/dL — ABNORMAL LOW (ref 8.9–10.3)
Chloride: 104 mmol/L (ref 98–111)
Creatinine, Ser: 1.39 mg/dL — ABNORMAL HIGH (ref 0.61–1.24)
GFR, Estimated: 50 mL/min — ABNORMAL LOW (ref >60)
Glucose, Bld: 223 mg/dL — ABNORMAL HIGH (ref 70–99)
Potassium: 4.1 mmol/L (ref 3.5–5.1)
Sodium: 135 mmol/L (ref 135–145)

## 2021-03-10 LAB — MAGNESIUM: Magnesium: 2 mg/dL (ref 1.7–2.4)

## 2021-03-10 MED ORDER — LISINOPRIL 5 MG PO TABS
5.0000 mg | ORAL_TABLET | Freq: Every day | ORAL | Status: DC
  Administered 2021-03-10 – 2021-03-11 (×2): 5 mg via ORAL
  Filled 2021-03-10 (×2): qty 1

## 2021-03-10 MED ORDER — INSULIN GLARGINE-YFGN 100 UNIT/ML ~~LOC~~ SOLN
15.0000 [IU] | Freq: Two times a day (BID) | SUBCUTANEOUS | Status: DC
  Administered 2021-03-10: 15 [IU] via SUBCUTANEOUS
  Filled 2021-03-10 (×3): qty 0.15

## 2021-03-10 MED ORDER — METOPROLOL SUCCINATE ER 25 MG PO TB24
25.0000 mg | ORAL_TABLET | Freq: Every day | ORAL | Status: DC
  Administered 2021-03-11: 25 mg via ORAL
  Filled 2021-03-10: qty 1

## 2021-03-10 NOTE — TOC Progression Note (Signed)
Transition of Care [facility]) - Progression Note    Patient Details  Name: [name] MRN: [id number] Date of Birth: 07/17/1934  Transition of Care [facility]) CM/SW [place], [place] Phone Number: 03/10/2021, 11:35 AM  Clinical Narrative:     CSW followed up with Andee Poles from [facility] who reports family is looking at 23 facility options, care patrol is meeting with family here at the hospital this afternoon and getting a choice.   They should have a placement choice by end of ay today or tomorrow   Expected Discharge Plan: [place] Barriers to Discharge: Continued Medical Work up  Expected Discharge Plan and Services Expected Discharge Plan: [place] arrangements for the past 2 months: Single Family Home                                       Social Determinants of Health (SDOH) Interventions    Readmission Risk Interventions Readmission Risk Prevention Plan 03/07/2020 02/12/2019 12/25/2018  Transportation Screening Complete - Complete  PCP or Specialist Appt within 3-5 Days Complete - Complete  HRI or Home Care Consult Complete - Complete  Social Work Consult for [place] Planning/Counseling Complete - Complete  Palliative Care Screening Not Applicable - Not Applicable  Medication Review Press photographer) Complete - Complete  PCP or Specialist appointment within 3-5 days of discharge - Not Complete -  PCP/Specialist Appt Not Complete comments - not ready for dc, family requests SNF placement -  HRI or Home Care Consult - Complete -  SW Recovery Care/Counseling Consult - Complete -  Palliative Care Screening - Not Applicable -  [place] - Complete -  Some recent data might be hidden

## 2021-03-10 NOTE — NC FL2 (Signed)
[place] LEVEL OF CARE SCREENING TOOL     IDENTIFICATION  Patient Name: [name] Birthdate: 1934/09/10 Sex: male Admission Date (Current Location): 03/06/2021  [facility] and [place] Number:  Engineering geologist and Address:  [facility], [address], [place], [place] [address]      Provider Number: [id number]  Attending Physician Name and Address:  Gwynne Edinger, MD  Relative Name and Phone Number:  [name] (son) [phone]    Current Level of Care: Hospital Recommended Level of Care: [place] Prior Approval Number:    Date Approved/Denied:   PASRR Number:    Discharge Plan: SNF    Current Diagnoses: Patient Active Problem List   Diagnosis Date Noted   Chronic systolic CHF (congestive heart failure) ([place]) 03/06/2021   Hyperglycemia due to type 2 diabetes mellitus ([place]) 11/10/2020   GERD (gastroesophageal reflux disease) 11/10/2020   Chest pain 11/09/2020   Acute encephalopathy 07/29/2020   Lactic acidosis 07/28/2020   Hypoxic 07/28/2020   Chronic prescription opiate use 07/28/2020   Elevated troponin 07/28/2020   Chronic anticoagulation 06/25/2020   Chronic kidney disease, stage 3a ([place]) 06/25/2020   AMS (altered mental status) 04/08/2020   CAD (coronary artery disease) 03/06/2020   Personal history of COVID-19 03/06/2020   Acute toxic-metabolic encephalopathy 31/51/7616   UTI (urinary tract infection) 03/06/2020   (HFpEF) heart failure with preserved ejection fraction ([place]) 03/06/2020   Goals of care, counseling/discussion    Palliative care by specialist    Seizure disorder ([place]) 11/30/2019   ACS (acute coronary syndrome) ([place]) 06/24/2019   Sinus bradycardia on ECG 05/16/2019   PAF (paroxysmal atrial fibrillation) ([place]) 03/27/2019   Hx of BKA, right ([place]) 03/02/2019   Severe protein-calorie malnutrition ([place]) 07/37/1062   Acute systolic CHF (congestive heart failure) ([place])  02/16/2019   Acute respiratory failure with hypoxia ([place]) 02/08/2019   Pneumonia due to COVID-19 virus 02/08/2019   Below-knee amputation of right lower extremity ([place]) 01/15/2019   Polyp of descending colon    Iron deficiency anemia due to chronic blood loss    Sacral decubitus ulcer, stage II ([place]) 12/26/2018   Acute respiratory failure ([place]) 12/20/2018   Femur fracture ([place]) 10/13/2018   Nonspecific chest pain 06/03/2018   NSTEMI (non-ST elevated myocardial infarction) ([place]) 01/16/2018   Acute CHF (congestive heart failure) ([place]) 11/23/2017   Atrial fibrillation with rapid ventricular response ([place]) 10/15/2017   BKA stump complication ([place]) 69/48/5462   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 ([place]) 02/10/2017   S/P bilateral BKA (below knee amputation) ([place]) 02/01/2017   Chronic diastolic heart failure ([place]) 01/21/2017   Pressure injury of skin 01/18/2017   Atherosclerosis of artery of extremity with gangrene ([place]) 01/05/2017   Diabetic foot ulcer ([place]) 12/29/2016   Ataxia 10/21/2016   Atherosclerosis of native arteries of the extremities with ulceration ([place]) 10/12/2016   History of femoral angiogram 09/06/2016   Left leg pain 09/06/2016   Leukocytosis 09/06/2016   Generalized weakness 09/06/2016   AKI (acute kidney injury) ([place]) 09/06/2016   Atrial fibrillation, chronic ([place]) 08/30/2016   Pneumonia 05/19/2016   Hyperlipidemia 04/20/2016   Back pain 04/19/2016   DM (diabetes mellitus), type 2 with complications ([place]) 70/35/0093   Primary hypertension 04/19/2016   PAD (peripheral artery disease) ([place]) 04/19/2016   Erectile dysfunction following radical prostatectomy 06/25/2015   History of prostate cancer 12/23/2014   Incontinence 12/23/2014    Orientation RESPIRATION BLADDER Height & Weight  Self, Place  Normal Incontinent, External catheter Weight: 185 lb 6.5 oz (84.1 kg) Height:  5\' 7"  (170.2 cm)  BEHAVIORAL SYMPTOMS/MOOD NEUROLOGICAL BOWEL  NUTRITION STATUS      Incontinent Diet (see discharge summary)  AMBULATORY STATUS COMMUNICATION OF NEEDS Skin   Limited Assist Verbally Normal                       Personal Care Assistance Level of Assistance  Bathing, Feeding, Dressing, Total care Bathing Assistance: Limited assistance Feeding assistance: Independent Dressing Assistance: Limited assistance Total Care Assistance: Limited assistance   Functional Limitations Info  Sight, Hearing, Speech Sight Info: Adequate Hearing Info: Adequate Speech Info: Adequate    SPECIAL CARE FACTORS FREQUENCY  PT (By licensed PT), OT (By licensed OT)     PT Frequency: min 3x weekly OT Frequency: min 3x weekly            Contractures Contractures Info: Not present    Additional Factors Info  Code Status, Allergies Code Status Info: DNR Allergies Info: contrast media (iodinated diagnostic agents), iohexol, metrizamide, latex           Current Medications (03/10/2021):  This is the current hospital active medication list Current Facility-Administered Medications  Medication Dose Route Frequency Provider Last Rate Last Admin   acetaminophen (TYLENOL) 160 MG/5ML solution 650 mg  650 mg Oral Q6H PRN Benita Gutter, RPH       acetaminophen (TYLENOL) tablet 650 mg  650 mg Oral Q6H PRN Athena Masse, MD   650 mg at 03/09/21 2353   Or   acetaminophen (TYLENOL) suppository 650 mg  650 mg Rectal Q6H PRN Athena Masse, MD       aspirin chewable tablet 81 mg  81 mg Oral Daily Gwynne Edinger, MD   81 mg at 03/10/21 1051   atorvastatin (LIPITOR) tablet 80 mg  80 mg Oral QHS Gwynne Edinger, MD   80 mg at 03/09/21 2113   diltiazem (CARDIZEM CD) 24 hr capsule 120 mg  120 mg Oral Daily Gwynne Edinger, MD   120 mg at 03/10/21 1051   divalproex (DEPAKOTE SPRINKLE) capsule 500 mg  500 mg Oral Daily Gwynne Edinger, MD   500 mg at 03/10/21 1053   hydrALAZINE (APRESOLINE) injection 10 mg  10 mg Intravenous Q2H PRN Wouk,  Ailene Rud, MD       insulin aspart (novoLOG) injection 0-15 Units  0-15 Units Subcutaneous TID WC Athena Masse, MD   8 Units at 03/10/21 1347   insulin aspart (novoLOG) injection 0-5 Units  0-5 Units Subcutaneous QHS Athena Masse, MD   2 Units at 03/08/21 2147   insulin glargine-yfgn (SEMGLEE) injection 15 Units  15 Units Subcutaneous BID Wouk, Ailene Rud, MD       isosorbide mononitrate (IMDUR) 24 hr tablet 120 mg  120 mg Oral Daily Gwynne Edinger, MD   120 mg at 03/10/21 1052   lisinopril (ZESTRIL) tablet 5 mg  5 mg Oral Daily Wouk, Ailene Rud, MD       Derrill Memo ON 03/11/2021] metoprolol succinate (TOPROL-XL) 24 hr tablet 25 mg  25 mg Oral Daily Wouk, Ailene Rud, MD       ondansetron [facility]) tablet 4 mg  4 mg Oral Q6H PRN Athena Masse, MD       Or   ondansetron [facility]) injection 4 mg  4 mg Intravenous Q6H PRN Athena Masse, MD  Rivaroxaban (XARELTO) tablet 15 mg  15 mg Oral Daily Athena Masse, MD   15 mg at 03/10/21 1053     Discharge Medications: Please see discharge summary for a list of discharge medications.  Relevant Imaging Results:  Relevant Lab Results:   Additional Information SSN: [id number]  Alberteen Sam, LCSW

## 2021-03-10 NOTE — Progress Notes (Signed)
Inpatient Diabetes Program Recommendations  AACE/ADA: New Consensus Statement on Inpatient Glycemic Control (2015)  Target Ranges:  Prepandial:   less than 140 mg/dL      Peak postprandial:   less than 180 mg/dL (1-2 hours)      Critically ill patients:  140 - 180 mg/dL   Lab Results  Component Value Date   GLUCAP 181 (H) 03/10/2021   HGBA1C 12.4 (H) 03/06/2021    Review of Glycemic Control Results for [name], [name] (MRN [id number]) as of 03/10/2021 09:49  Ref. Range 03/09/2021 07:26 03/09/2021 12:39 03/09/2021 16:41 03/09/2021 20:51 03/10/2021 08:05  Glucose-Capillary Latest Ref Range: 70 - 99 mg/dL 78 320 (H) 298 (H) 151 (H) 181 (H)   Diabetes history: DM 2 Current orders for Inpatient glycemic control:  Semglee 10 units bid Novolog 0-15 units tid + hs  Inpatient Diabetes Program Recommendations:    - May need to add back some Novolog meal coverage, 3-4 units tid  Thanks,  Tama Headings RN, MSN, BC-ADM Inpatient Diabetes Coordinator Team Pager [phone] (8a-5p)

## 2021-03-10 NOTE — Progress Notes (Addendum)
PROGRESS NOTE    [name]  MOQ:[id number] DOB: [birth year] DOA: 03/06/2021 PCP: Corey Skains, MD  Outpatient Specialists: cardiology, vascular surgery    Brief Narrative:   From admission hpi: [name] is a 86 y.o. male with medical history significant for CAD, HTN, PAD s/p bilateral BKA, paroxysmal A. fib on Xarelto, systolic heart failure (EF 35 to 40% 12/14/2020), CKD 3A, seizure disorder, chronic pain on chronic opiates presenting by EMS with altered mental status and blood sugar readings in the 500s.  History is limited due to altered mental status and is taken mostly from ED provider.   ED course: On arrival, bradycardic 54-59 with otherwise normal vitals Blood work WBC 8.9, lactic acid 2.0> 1.6.  Blood glucose 499, creatinine 1.91 above baseline of 1.38, troponin 41.  Urinalysis unremarkable, COVID and flu negative   Assessment & Plan:   Principal Problem:   Acute toxic-metabolic encephalopathy Active Problems:   PAD (peripheral artery disease) (HCC)   PAF (paroxysmal atrial fibrillation) (HCC)   CAD (coronary artery disease)   Chronic kidney disease, stage 3a (HCC)   Lactic acidosis   Chronic prescription opiate use   Elevated troponin   Hyperglycemia due to type 2 diabetes mellitus (HCC)   Chronic systolic CHF (congestive heart failure) ([place])  # Delirium # Lactic acidosis Wife reports that patient's baseline is waxing and waning confusion, she says his current confusion is a bit off from the normal waxing and waning of his confusion. No signs infection, no electrolyte derangements. Was hyperglycemic on presentation. Elevated lactate resolved. No report of seizure-like activity. On further discussion with daughter, mental status appears to be at baseline. No agitation overnight, this morning much more calm and focused, all consistent w/ delirium - delirium precautions - hold haldol, agitation appears to be resolved today - needs long-term  care, TOC has shared care patrol resource w/ family. TOC to touch base w/ family today. They are in the process of deciding on long-term care, may have decision as early as later today  # Hypokalemia # Hypomagnesemia Resolved - monitor  # T2DM # Hyperglycemia Wife says she and husband are responsible for his meds but they are frequently confused, he doesn't get insulin as prescribed. Glucose improved with resumption of home insulin. This morning hyperglycemic - had decreased home lantus from 20 bid to 10 bid. Will increase to 15 bid. continue ssi  # CAD Trops mildly elevated and flat, do not think acs. Cath in 2021 showed stable multi-vessel disease - cont home statin, aspirin, imdur  # HFrEF Recent ef 35-40%. Appears euvolemic - cont home imdur - have resumed home metop and dilt - start lisinopril 5 qd given hfref  # AKI on ckd 3b AKI resolved with fluids - monitor  # HTN Here bp wnl - resumed home imdur - starting lisinopril as above - prn hydral - resumed home metop and dilt  # A-fib Chronic. Metop and dilt initially held 2/2 bradycardia. Had episode of asymptomatic rvr overnight 10/15. Today bradycardic, asymptomatic - eliquis as above - resumed home metop and dilt, will reduce dose of metop to 25 qd  # Seizure disorder Random depakote level low; there is concern for not taking meds as prescribed - home depakote  # PAD # b/l BKA - asa, statin, xarelto   DVT prophylaxis: xarelto Code Status: DNR, confirmed w/ daughter Family Communication: son updated telephonically 10/17. No answer when called today  Level of care: Progressive Cardiac Status is: Inpatient  Remains inpatient appropriate because: unsafe d/c plan    Consultants:  none  Procedures: none  Antimicrobials:  S/p cefepime    Subjective: Awake and alert, calm, no complaints  Objective: Vitals:   03/09/21 2353 03/10/21 0356 03/10/21 0800 03/10/21 1208  BP: 126/69 108/67 108/65 129/65   Pulse: 70 61 76 (!) 48  Resp: 18 20 16 15   Temp: 97.8 F (36.6 C) (!) 97.5 F (36.4 C) 98 F (36.7 C) 98.5 F (36.9 C)  TempSrc: Oral Oral Oral Oral  SpO2: 95% 91% 96% (!) 88%  Weight:      Height:        Intake/Output Summary (Last 24 hours) at 03/10/2021 1429 Last data filed at 03/10/2021 1340 Gross per 24 hour  Intake 960 ml  Output 300 ml  Net 660 ml   Filed Weights   03/06/21 0030  Weight: 84.1 kg    Examination:  General exam: nad, awake and alert Respiratory system: Clear to auscultation. Respiratory effort normal. Cardiovascular system: S1 & S2 heard, RRR. No JVD, murmurs, rubs, gallops or clicks. No pedal edema. Gastrointestinal system: Abdomen is obese, soft and nontender. No organomegaly or masses felt. Normal bowel sounds heard. Central nervous system: moving all 4 extremities Extremities: b/l bka Skin: No rashes, lesions or ulcers Psychiatry: calm, oriented to self    Data Reviewed: I have personally reviewed following labs and imaging studies  CBC: Recent Labs  Lab 03/06/21 0039  WBC 8.9  NEUTROABS 5.7  HGB 14.2  HCT 40.3  MCV 85.7  PLT 324   Basic Metabolic Panel: Recent Labs  Lab 03/06/21 2056 03/07/21 0604 03/08/21 0613 03/09/21 0551 03/10/21 0432  NA 138 138 140 138 135  K 2.9* 4.0 3.3* 4.7 4.1  CL 99 101 100 104 104  CO2 28 25 27 25 27   GLUCOSE 85 149* 55* 81 223*  BUN 24* 21 27* 37* 38*  CREATININE 1.48* 1.49* 1.30* 1.61* 1.39*  CALCIUM 9.0 8.9 8.9 8.9 8.1*  MG 1.5*  --  2.0 2.1 2.0  PHOS  --  2.3*  --   --   --    GFR: Estimated Creatinine Clearance: 41.7 mL/min (A) (by C-G formula based on SCr of 1.39 mg/dL (H)). Liver Function Tests: Recent Labs  Lab 03/06/21 0039  AST 22  ALT 19  ALKPHOS 104  BILITOT 1.0  PROT 6.7  ALBUMIN 3.5   No results for input(s): LIPASE, AMYLASE in the last 168 hours. No results for input(s): AMMONIA in the last 168 hours. Coagulation Profile: No results for input(s): INR, PROTIME  in the last 168 hours. Cardiac Enzymes: No results for input(s): CKTOTAL, CKMB, CKMBINDEX, TROPONINI in the last 168 hours. BNP (last 3 results) No results for input(s): PROBNP in the last 8760 hours. HbA1C: No results for input(s): HGBA1C in the last 72 hours.  CBG: Recent Labs  Lab 03/09/21 1239 03/09/21 1641 03/09/21 2051 03/10/21 0805 03/10/21 1203  GLUCAP 320* 298* 151* 181* 251*   Lipid Profile: No results for input(s): CHOL, HDL, LDLCALC, TRIG, CHOLHDL, LDLDIRECT in the last 72 hours. Thyroid Function Tests: No results for input(s): TSH, T4TOTAL, FREET4, T3FREE, THYROIDAB in the last 72 hours. Anemia Panel: No results for input(s): VITAMINB12, FOLATE, FERRITIN, TIBC, IRON, RETICCTPCT in the last 72 hours.  Urine analysis:    Component Value Date/Time   COLORURINE YELLOW (A) 03/06/2021 0055   APPEARANCEUR HAZY (A) 03/06/2021 0055   LABSPEC 1.026 03/06/2021 0055   PHURINE 5.0 03/06/2021 0055  GLUCOSEU >=500 (A) 03/06/2021 0055   HGBUR NEGATIVE 03/06/2021 0055   BILIRUBINUR NEGATIVE 03/06/2021 0055   KETONESUR 5 (A) 03/06/2021 0055   PROTEINUR 30 (A) 03/06/2021 0055   NITRITE NEGATIVE 03/06/2021 0055   LEUKOCYTESUR NEGATIVE 03/06/2021 0055   Sepsis Labs: @LABRCNTIP (procalcitonin:4,lacticidven:4)  ) Recent Results (from the past 240 hour(s))  Resp Panel by RT-PCR (Flu A&B, Covid) Nasopharyngeal Swab     Status: None   Collection Time: 03/06/21 12:55 AM   Specimen: Nasopharyngeal Swab; Nasopharyngeal(NP) swabs in vial transport medium  Result Value Ref Range Status   SARS Coronavirus 2 by RT PCR NEGATIVE NEGATIVE Final    Comment: (NOTE) SARS-CoV-2 target nucleic acids are NOT DETECTED.  The SARS-CoV-2 RNA is generally detectable in upper respiratory specimens during the acute phase of infection. The lowest concentration of SARS-CoV-2 viral copies this assay can detect is 138 copies/mL. A negative result does not preclude SARS-Cov-2 infection and should not  be used as the sole basis for treatment or other patient management decisions. A negative result may occur with  improper specimen collection/handling, submission of specimen other than nasopharyngeal swab, presence of viral mutation(s) within the areas targeted by this assay, and inadequate number of viral copies(<138 copies/mL). A negative result must be combined with clinical observations, patient history, and epidemiological information. The expected result is Negative.  Fact Sheet for Patients:  EntrepreneurPulse.com.au  Fact Sheet for Healthcare Providers:  IncredibleEmployment.be  This test is no t yet approved or cleared by the [place] FDA and  has been authorized for detection and/or diagnosis of SARS-CoV-2 by FDA under an Emergency Use Authorization (EUA). This EUA will remain  in effect (meaning this test can be used) for the duration of the COVID-19 declaration under Section 564(b)(1) of the Act, 21 U.S.C.section 360bbb-3(b)(1), unless the authorization is terminated  or revoked sooner.       Influenza A by PCR NEGATIVE NEGATIVE Final   Influenza B by PCR NEGATIVE NEGATIVE Final    Comment: (NOTE) The Xpert Xpress SARS-CoV-2/FLU/RSV plus assay is intended as an aid in the diagnosis of influenza from Nasopharyngeal swab specimens and should not be used as a sole basis for treatment. Nasal washings and aspirates are unacceptable for Xpert Xpress SARS-CoV-2/FLU/RSV testing.  Fact Sheet for Patients: EntrepreneurPulse.com.au  Fact Sheet for Healthcare Providers: IncredibleEmployment.be  This test is not yet approved or cleared by the [place] FDA and has been authorized for detection and/or diagnosis of SARS-CoV-2 by FDA under an Emergency Use Authorization (EUA). This EUA will remain in effect (meaning this test can be used) for the duration of the COVID-19 declaration under Section  564(b)(1) of the Act, 21 U.S.C. section 360bbb-3(b)(1), unless the authorization is terminated or revoked.  Performed at [facility], [address] [place], [place] [address]   Culture, blood (routine x 2)     Status: None (Preliminary result)   Collection Time: 03/06/21  1:54 AM   Specimen: BLOOD  Result Value Ref Range Status   Specimen Description BLOOD LEFT HAND  Final   Special Requests   Final    BOTTLES DRAWN AEROBIC AND ANAEROBIC Blood Culture adequate volume   Culture   Final    NO GROWTH 4 DAYS Performed at [facility], [address] [place], [place] [address]    Report Status PENDING  Incomplete  Culture, blood (routine x 2)     Status: None (Preliminary result)   Collection Time: 03/06/21  1:56 AM   Specimen:  BLOOD  Result Value Ref Range Status   Specimen Description BLOOD RIGHT HAND  Final   Special Requests   Final    BOTTLES DRAWN AEROBIC AND ANAEROBIC Blood Culture adequate volume   Culture   Final    NO GROWTH 4 DAYS Performed at [facility], [address], [place], [place] [address]    Report Status PENDING  Incomplete         Radiology Studies: No results found.      Scheduled Meds:  aspirin  81 mg Oral Daily   atorvastatin  80 mg Oral QHS   diltiazem  120 mg Oral Daily   divalproex  500 mg Oral Daily   insulin aspart  0-15 Units Subcutaneous TID WC   insulin aspart  0-5 Units Subcutaneous QHS   insulin glargine-yfgn  10 Units Subcutaneous BID   isosorbide mononitrate  120 mg Oral Daily   metoprolol succinate  50 mg Oral Daily   Rivaroxaban  15 mg Oral Daily   Continuous Infusions:   LOS: 4 days    Time spent: 20 min    Desma Maxim, MD [facility]   If 7PM-7AM, please contact night-coverage [email or web address] Password [email or web address] 03/10/2021, 2:29 PM

## 2021-03-11 DIAGNOSIS — G9341 Metabolic encephalopathy: Secondary | ICD-10-CM | POA: Diagnosis not present

## 2021-03-11 LAB — CULTURE, BLOOD (ROUTINE X 2)
Culture: NO GROWTH
Culture: NO GROWTH
Special Requests: ADEQUATE
Special Requests: ADEQUATE

## 2021-03-11 LAB — BASIC METABOLIC PANEL
Anion gap: 7 (ref 5–15)
BUN: 38 mg/dL — ABNORMAL HIGH (ref 8–23)
CO2: 26 mmol/L (ref 22–32)
Calcium: 8.5 mg/dL — ABNORMAL LOW (ref 8.9–10.3)
Chloride: 104 mmol/L (ref 98–111)
Creatinine, Ser: 1.39 mg/dL — ABNORMAL HIGH (ref 0.61–1.24)
GFR, Estimated: 50 mL/min — ABNORMAL LOW (ref >60)
Glucose, Bld: 154 mg/dL — ABNORMAL HIGH (ref 70–99)
Potassium: 4.3 mmol/L (ref 3.5–5.1)
Sodium: 137 mmol/L (ref 135–145)

## 2021-03-11 LAB — RESP PANEL BY RT-PCR (FLU A&B, COVID) ARPGX2
Influenza A by PCR: NEGATIVE
Influenza B by PCR: NEGATIVE
SARS Coronavirus 2 by RT PCR: NEGATIVE

## 2021-03-11 LAB — GLUCOSE, CAPILLARY
Glucose-Capillary: 151 mg/dL — ABNORMAL HIGH (ref 70–99)
Glucose-Capillary: 267 mg/dL — ABNORMAL HIGH (ref 70–99)

## 2021-03-11 LAB — MAGNESIUM: Magnesium: 2 mg/dL (ref 1.7–2.4)

## 2021-03-11 MED ORDER — BASAGLAR KWIKPEN 100 UNIT/ML ~~LOC~~ SOPN: 20.0000 [IU] | PEN_INJECTOR | Freq: Two times a day (BID) | SUBCUTANEOUS | Status: DC

## 2021-03-11 MED ORDER — INSULIN GLARGINE-YFGN 100 UNIT/ML ~~LOC~~ SOLN
20.0000 [IU] | Freq: Two times a day (BID) | SUBCUTANEOUS | Status: DC
  Administered 2021-03-11: 20 [IU] via SUBCUTANEOUS
  Filled 2021-03-11 (×2): qty 0.2

## 2021-03-11 MED ORDER — METOPROLOL SUCCINATE ER 25 MG PO TB24: 25.0000 mg | ORAL_TABLET | Freq: Every day | ORAL | 1 refills | Status: DC

## 2021-03-11 NOTE — Plan of Care (Signed)
  Problem: Clinical Measurements: Goal: Ability to maintain clinical measurements within normal limits will improve Outcome: Progressing   Problem: Clinical Measurements: Goal: Cardiovascular complication will be avoided Outcome: Progressing   Problem: Nutrition: Goal: Adequate nutrition will be maintained Outcome: Progressing   Problem: Safety: Goal: Ability to remain free from injury will improve Outcome: Progressing

## 2021-03-11 NOTE — Progress Notes (Addendum)
EKG resulted. MD Damita Dunnings made aware. Will notify incoming shift. Will continue to monitor.  Update 0615: No new order. Will continue to monitor.

## 2021-03-11 NOTE — TOC Transition Note (Signed)
Transition of Care [facility]) - CM/SW Discharge Note   Patient Details  Name: [name] MRN: [id number] Date of Birth: 12/14/1934  [facility] CM/SW Contact:  Alberteen Sam, LCSW Phone Number: 03/11/2021, 10:16 AM   Clinical Narrative:     Patient will DC to: [facility] Anticipated DC date: 03/11/21 Family notified: son [name] Transport byJohnanna Schneiders  Per MD patient ready for DC to  [facility]. RN, patient, patient's family, and facility notified of DC. Discharge Summary sent to facility. RN given number for report   [phone] Room 610A. DC packet on chart. Ambulance transport requested for patient.  CSW signing off.  Pricilla Riffle, LCSW    Final next level of care: Skilled Nursing Facility Barriers to Discharge: No Barriers Identified   Patient Goals and CMS Choice Patient states their goals for this hospitalization and ongoing recovery are:: to go home CMS Medicare.gov Compare Post Acute Care list provided to:: Patient Represenative (must comment) (son) Choice offered to / list presented to : Adult Children  Discharge Placement              Patient chooses bed at: [facility] Patient to be transferred to facility by: [facility] Name of family member notified: son [name] Patient and family notified of of transfer: 03/11/21  Discharge Plan and Services                                     Social Determinants of Health (SDOH) Interventions     Readmission Risk Interventions Readmission Risk Prevention Plan 03/07/2020 02/12/2019 12/25/2018  Transportation Screening Complete - Complete  PCP or Specialist Appt within 3-5 Days Complete - Complete  HRI or Home Care Consult Complete - Complete  Social Work Consult for [place] Planning/Counseling Complete - Complete  Palliative Care Screening Not Applicable - Not Applicable  Medication Review Press photographer) Complete - Complete  PCP or Specialist appointment within 3-5  days of discharge - Not Complete -  PCP/Specialist Appt Not Complete comments - not ready for dc, family requests SNF placement -  HRI or Home Care Consult - Complete -  SW Recovery Care/Counseling Consult - Complete -  Palliative Care Screening - Not Applicable -  [place] - Complete -  Some recent data might be hidden

## 2021-03-11 NOTE — Discharge Summary (Signed)
[facility] NAME: [name]    MR#:  [id number]  DATE OF BIRTH:  1934/11/25  DATE OF ADMISSION:  03/06/2021 ADMITTING PHYSICIAN: Athena Masse, MD  DATE OF DISCHARGE: 03/11/2021  PRIMARY CARE PHYSICIAN: Sofie Hartigan, MD    ADMISSION DIAGNOSIS:  Bradycardia [R00.1] Mobitz II [I44.1] Hyperglycemia [R73.9] AKI (acute kidney injury) ([place]) [N17.9] Altered mental status, unspecified altered mental status type [R41.82] Hyperglycemia due to type 2 diabetes mellitus ([place]) [E11.65] Sepsis, due to unspecified organism, unspecified whether acute organ dysfunction present ([place]) [A41.9]  DISCHARGE DIAGNOSIS:    SECONDARY DIAGNOSIS:   Past Medical History:  Diagnosis Date  . Arthritis   . Atrial fibrillation ([place])   . Carcinoma of prostate ([place])   . CHF (congestive heart failure) ([place])   . Chronic kidney disease   . Diabetes mellitus without complication ([place])   . ED (erectile dysfunction)   . Frequent urination   . Hyperlipidemia   . Hypertension   . Moderate mitral insufficiency   . Peripheral vascular disease ([place])   . Pneumonia 04/2016  . Prostate cancer ([place])   . Sleep apnea    OSA--USE C-PAP  . Ulcer of left foot due to type 2 diabetes mellitus ([place])   . Urinary stress incontinence, male     HOSPITAL COURSE:    [name] is a 86 y.o. male with medical history significant for CAD, HTN, PAD s/p bilateral BKA, paroxysmal A. fib on Xarelto, systolic heart failure (EF 35 to 40% 12/14/2020), CKD 3A, seizure disorder, chronic pain on chronic opiates presenting by EMS with altered mental status and blood sugar readings in the 500s.  # Delirium # Lactic acidosis --Wife reports that patient's baseline is waxing and waning confusion, she says his current confusion is a bit off from the normal waxing and waning of his confusion. No signs infection, no electrolyte derangements. Was hyperglycemic on  presentation. Elevated lactate resolved. No report of seizure-like activity.  - delirium precautions -pt appears pleasantly confused - needs long-term care, TOC has shared care patrol resource w/ family.  -- [facility] to [facility] bed [facility]  # Hypokalemia # Hypomagnesemia Resolved - monitor   # T2DM # Hyperglycemia --will d/c on Insulin glargine 20 units bid and SSI --hold metformin  # CAD Trops mildly elevated and flat, do not think acs. Cath in 2021 showed stable multi-vessel disease - cont home statin, aspirin, imdur   # HFrEF Recent ef 35-40%. Appears euvolemic - cont home imdur - have resumed home metop and diltiazem --f/u Cardiology -Dr Nehemiah Massed as out pt.  # AKI on ckd 3b AKI resolved with fluids - monitor   # HTN Here bp wnl - resumed home imdur -- resumed home metop and dilt   # A-fib - eliquis as above - resumed home metop and diltiazem-- will reduce dose of metoprolol to 25 mg qd   # Seizure disorder Random depakote level low; there is concern for not taking meds as prescribed - home depakote   # PAD # b/l BKA - asa, statin, xarelto     DVT prophylaxis: xarelto Code Status: DNR, Family Communication: son updated telephonically 10/19.   Overall appears at baseline. Will d/c to [facility] today  DRUG ALLERGIES:   Allergies  Allergen Reactions  . Contrast Media [Iodinated Diagnostic Agents] Shortness Of Breath    SOB  . Iohexol Shortness Of Breath     Desc: Respiratory Distress,  Laryngedema, diaphoresis, Onset Date: [id number]   . Metrizamide Shortness Of Breath    SOB SOB SOB   . Latex Rash    DISCHARGE MEDICATIONS:   Allergies as of 03/11/2021       Reactions   Contrast Media [iodinated Diagnostic Agents] Shortness Of Breath   SOB   Iohexol Shortness Of Breath    Desc: Respiratory Distress, Laryngedema, diaphoresis, Onset Date: [id number]   Metrizamide Shortness Of Breath   SOB SOB SOB   Latex Rash         Medication List     STOP taking these medications    chlorthalidone 25 MG tablet Commonly known as: HYGROTON   metFORMIN 500 MG tablet Commonly known as: GLUCOPHAGE       TAKE these medications    aspirin 81 MG EC tablet Take 81 mg by mouth daily.   atorvastatin 80 MG tablet Commonly known as: LIPITOR Take 80 mg by mouth at bedtime.   Basaglar KwikPen 100 UNIT/ML Inject 20 Units into the skin 2 (two) times daily. What changed:  how much to take when to take this   bisacodyl 5 MG EC tablet Commonly known as: DULCOLAX Take 1 tablet (5 mg total) by mouth daily as needed for moderate constipation.   diltiazem 120 MG 24 hr capsule Commonly known as: CARDIZEM CD Take 120 mg by mouth daily. What changed: Another medication with the same name was removed. Continue taking this medication, and follow the directions you see here.   divalproex 500 MG DR tablet Commonly known as: DEPAKOTE Take 500 mg by mouth daily.   Ensure Max Protein Liqd Take 330 mLs (11 oz total) by mouth 2 (two) times daily.   ferrous sulfate 325 (65 FE) MG tablet Take 325 mg by mouth daily with breakfast.   isosorbide mononitrate 120 MG 24 hr tablet Commonly known as: IMDUR Take 1 tablet (120 mg total) by mouth daily.   metoprolol succinate 25 MG 24 hr tablet Commonly known as: TOPROL-XL Take 1 tablet (25 mg total) by mouth daily. Start taking on: March 12, 2021 What changed:  medication strength how much to take additional instructions   multivitamin tablet Take 1 tablet by mouth daily.   nitroGLYCERIN 0.4 MG SL tablet Commonly known as: NITROSTAT Place 1 tablet (0.4 mg total) under the tongue every 5 (five) minutes as needed for chest pain.   NovoLOG FlexPen 100 UNIT/ML FlexPen Generic drug: insulin aspart Inject 6 Units into the skin 3 (three) times daily with meals. What changed: how much to take   Rivaroxaban 15 MG Tabs tablet Commonly known as: XARELTO Take 1 tablet (15  mg total) by mouth daily with supper. Hold Xarelto for 3 days.  Start on January 09, 2019. What changed: additional instructions        If you experience worsening of your admission symptoms, develop shortness of breath, life threatening emergency, suicidal or homicidal thoughts you must seek medical attention immediately by calling 911 or calling your MD immediately  if symptoms less severe.  You Must read complete instructions/literature along with all the possible adverse reactions/side effects for all the Medicines you take and that have been prescribed to you. Take any new Medicines after you have completely understood and accept all the possible adverse reactions/side effects.   Please note  You were cared for by a hospitalist during your hospital stay. If you have any questions about your discharge medications or the care you received while you were in the  hospital after you are discharged, you can call the unit and asked to speak with the hospitalist on call if the hospitalist that took care of you is not available. Once you are discharged, your primary care physician will handle any further medical issues. Please note that NO REFILLS for any discharge medications will be authorized once you are discharged, as it is imperative that you return to your primary care physician (or establish a relationship with a primary care physician if you do not have one) for your aftercare needs so that they can reassess your need for medications and monitor your lab values. Today   SUBJECTIVE  calm   VITAL SIGNS:  Blood pressure 111/76, pulse (!) 48, temperature 97.7 F (36.5 C), temperature source Oral, resp. rate 16, height 5\' 7"  (1.702 m), weight 84.1 kg, SpO2 96 %.  I/O:   Intake/Output Summary (Last 24 hours) at 03/11/2021 1014 Last data filed at 03/11/2021 0339 Gross per 24 hour  Intake 600 ml  Output 1000 ml  Net -400 ml    PHYSICAL EXAMINATION:  GENERAL:  86 y.o.-year-old patient  lying in the bed with no acute distress.  LUNGS: Normal breath sounds bilaterally, no wheezing, rales,rhonchi or crepitation. No use of accessory muscles of respiration.  CARDIOVASCULAR: S1, S2 normal. No murmurs, rubs, or gallops.  ABDOMEN: Soft, non-tender, non-distended. Bowel sounds present. No organomegaly or mass.  EXTREMITIES: bilateral amputation stump ok NEUROLOGIC:non focal PSYCHIATRIC:  patient is alert. At baseline dementia+   DATA REVIEW:   CBC  Recent Labs  Lab 03/06/21 0039  WBC 8.9  HGB 14.2  HCT 40.3  PLT 202    Chemistries  Recent Labs  Lab 03/06/21 0039 03/06/21 0836 03/11/21 0616  NA 132*   < > 137  K 4.1   < > 4.3  CL 95*   < > 104  CO2 25   < > 26  GLUCOSE 499*   < > 154*  BUN 31*   < > 38*  CREATININE 1.91*   < > 1.39*  CALCIUM 8.9   < > 8.5*  MG  --    < > 2.0  AST 22  --   --   ALT 19  --   --   ALKPHOS 104  --   --   BILITOT 1.0  --   --    < > = values in this interval not displayed.    Microbiology Results   Recent Results (from the past 240 hour(s))  Resp Panel by RT-PCR (Flu A&B, Covid) Nasopharyngeal Swab     Status: None   Collection Time: 03/06/21 12:55 AM   Specimen: Nasopharyngeal Swab; Nasopharyngeal(NP) swabs in vial transport medium  Result Value Ref Range Status   SARS Coronavirus 2 by RT PCR NEGATIVE NEGATIVE Final    Comment: (NOTE) SARS-CoV-2 target nucleic acids are NOT DETECTED.  The SARS-CoV-2 RNA is generally detectable in upper respiratory specimens during the acute phase of infection. The lowest concentration of SARS-CoV-2 viral copies this assay can detect is 138 copies/mL. A negative result does not preclude SARS-Cov-2 infection and should not be used as the sole basis for treatment or other patient management decisions. A negative result may occur with  improper specimen collection/handling, submission of specimen other than nasopharyngeal swab, presence of viral mutation(s) within the areas targeted by  this assay, and inadequate number of viral copies(<138 copies/mL). A negative result must be combined with clinical observations, patient history, and epidemiological information. The expected  result is Negative.  Fact Sheet for Patients:  EntrepreneurPulse.com.au  Fact Sheet for Healthcare Providers:  IncredibleEmployment.be  This test is no t yet approved or cleared by the [place] FDA and  has been authorized for detection and/or diagnosis of SARS-CoV-2 by FDA under an Emergency Use Authorization (EUA). This EUA will remain  in effect (meaning this test can be used) for the duration of the COVID-19 declaration under Section 564(b)(1) of the Act, 21 U.S.C.section 360bbb-3(b)(1), unless the authorization is terminated  or revoked sooner.       Influenza A by PCR NEGATIVE NEGATIVE Final   Influenza B by PCR NEGATIVE NEGATIVE Final    Comment: (NOTE) The Xpert Xpress SARS-CoV-2/FLU/RSV plus assay is intended as an aid in the diagnosis of influenza from Nasopharyngeal swab specimens and should not be used as a sole basis for treatment. Nasal washings and aspirates are unacceptable for Xpert Xpress SARS-CoV-2/FLU/RSV testing.  Fact Sheet for Patients: EntrepreneurPulse.com.au  Fact Sheet for Healthcare Providers: IncredibleEmployment.be  This test is not yet approved or cleared by the [place] FDA and has been authorized for detection and/or diagnosis of SARS-CoV-2 by FDA under an Emergency Use Authorization (EUA). This EUA will remain in effect (meaning this test can be used) for the duration of the COVID-19 declaration under Section 564(b)(1) of the Act, 21 U.S.C. section 360bbb-3(b)(1), unless the authorization is terminated or revoked.  Performed at [facility], [address] [place], [place] [address]   Culture, blood (routine x 2)     Status: None   Collection Time: 03/06/21   1:54 AM   Specimen: BLOOD  Result Value Ref Range Status   Specimen Description BLOOD LEFT HAND  Final   Special Requests   Final    BOTTLES DRAWN AEROBIC AND ANAEROBIC Blood Culture adequate volume   Culture   Final    NO GROWTH 5 DAYS Performed at [facility], [address] [place], [place] [address]    Report Status 03/11/2021 FINAL  Final  Culture, blood (routine x 2)     Status: None   Collection Time: 03/06/21  1:56 AM   Specimen: BLOOD  Result Value Ref Range Status   Specimen Description BLOOD RIGHT HAND  Final   Special Requests   Final    BOTTLES DRAWN AEROBIC AND ANAEROBIC Blood Culture adequate volume   Culture   Final    NO GROWTH 5 DAYS Performed at [facility], [address], [place], [place] [address]    Report Status 03/11/2021 FINAL  Final    RADIOLOGY:  No results found.   CODE STATUS:     Code Status Orders  (From admission, onward)           Start     Ordered   03/06/21 1517  Do not attempt resuscitation (DNR)  Continuous       Question Answer Comment  In the event of cardiac or respiratory ARREST Do not call a "code blue"   In the event of cardiac or respiratory ARREST Do not perform Intubation, CPR, defibrillation or ACLS   In the event of cardiac or respiratory ARREST Use medication by any route, position, wound care, and other measures to relive pain and suffering. May use oxygen, suction and manual treatment of airway obstruction as needed for comfort.      03/06/21 1516           Code Status History     Date Active Date Inactive  Code Status Order ID Comments User Context   03/06/2021 0313 03/06/2021 1516 Full Code [id number]  Athena Masse, MD ED   12/13/2020 2326 12/15/2020 1759 Full Code [id number]  Para Skeans, MD ED   11/09/2020 2309 11/10/2020 2248 Full Code [id number]  Orene Desanctis, DO ED   10/24/2020 0247 10/25/2020 2015 DNR [id number]  Sidney Ace, Arvella Merles, MD ED   07/28/2020 2052 07/29/2020 2357 DNR [id number]   Athena Masse, MD ED   06/25/2020 2321 06/26/2020 2354 DNR [id number]  Athena Masse, MD ED   06/25/2020 2258 06/25/2020 2321 DNR [id number]  Athena Masse, MD ED   04/08/2020 1628 04/11/2020 0019 Full Code [id number]  Para Skeans, MD ED   04/08/2020 1623 04/08/2020 1628 DNR [id number]  Para Skeans, MD ED   04/08/2020 1548 04/08/2020 1623 Full Code [id number]  Para Skeans, MD ED   03/06/2020 0440 03/08/2020 2104 DNR [id number]  Athena Masse, MD ED   12/04/2019 1742 12/11/2019 1943 DNR [id number]  Flora Lipps, MD Inpatient   11/30/2019 0321 12/04/2019 1742 Full Code [id number]  Bradly Bienenstock, NP ED   06/24/2019 0620 06/26/2019 1921 Full Code [id number]  Mansy, Arvella Merles, MD ED   02/08/2019 1637 02/19/2019 1719 Full Code [id number]  Bonnielee Haff, MD Inpatient   12/20/2018 1009 01/05/2019 2212 Full Code [id number]  [name], MD ED   12/18/2018 1600 12/18/2018 2113 Full Code [id number]  Algernon Huxley, MD Inpatient   10/13/2018 1145 10/18/2018 1443 Full Code [id number]  [name] Mandes, MD Inpatient   06/03/2018 0301 06/09/2018 2013 Full Code [id number]  Lance Coon, MD Inpatient   01/16/2018 1515 01/18/2018 1635 Full Code [id number]  Dustin Flock, MD ED   11/23/2017 1346 11/28/2017 2020 Full Code [id number]  Nicholes Mango, MD Inpatient   10/26/2017 1534 10/28/2017 1854 Full Code [id number]  Bettey Costa, MD Inpatient   10/26/2017 1437 10/26/2017 1534 Full Code [id number]  Feliberto Gottron, [place] Inpatient   10/24/2017 1152 10/26/2017 1437 DNR [id number]  Bettey Costa, MD Inpatient   10/15/2017 0309 10/24/2017 1152 Full Code [id number]  Arta Silence, MD Inpatient   02/10/2017 1904 02/12/2017 1908 Full Code [id number]  Delana Meyer, Dolores Lory, MD ED   02/10/2017 1638 02/10/2017 1904 Full Code [id number]  Sela Hua, PA-C Outpatient   01/08/2017 1342 01/19/2017 2103 Full Code [id number]  Loletha Grayer, MD ED   12/29/2016 2232 12/30/2016 1634 Full Code [id number]  Lance Coon, MD ED   12/20/2016 1251 12/20/2016 1842 Full Code  [id number]  Algernon Huxley, MD Inpatient   10/22/2016 0040 10/24/2016 2058 Full Code [id number]  Lance Coon, MD Inpatient   10/14/2016 1201 10/14/2016 1925 Full Code [id number]  Algernon Huxley, MD Inpatient   08/30/2016 1338 09/06/2016 1941 Full Code [id number]  Epifanio Lesches, MD Inpatient   05/19/2016 0454 05/22/2016 1907 Full Code [id number]  Saundra Shelling, MD Inpatient        TOTAL TIME TAKING CARE OF THIS PATIENT: 40 minutes.    [name] Mandes M.D  [facility]    CC: Primary care physician; Sofie Hartigan, MD

## 2021-04-17 ENCOUNTER — Emergency Department
Admission: EM | Admit: 2021-04-17 | Discharge: 2021-04-17 | Disposition: A | Discharge status: Home / Self Care | Payer: Medicare Other | Attending: Emergency Medicine | Admitting: Emergency Medicine

## 2021-04-17 ENCOUNTER — Emergency Department: Payer: Medicare Other

## 2021-04-17 ENCOUNTER — Other Ambulatory Visit: Payer: Self-pay

## 2021-04-17 DIAGNOSIS — Z20822 Contact with and (suspected) exposure to covid-19: Secondary | ICD-10-CM | POA: Diagnosis not present

## 2021-04-17 DIAGNOSIS — R410 Disorientation, unspecified: Secondary | ICD-10-CM | POA: Diagnosis not present

## 2021-04-17 DIAGNOSIS — N1831 Chronic kidney disease, stage 3a: Secondary | ICD-10-CM | POA: Diagnosis not present

## 2021-04-17 DIAGNOSIS — Z794 Long term (current) use of insulin: Secondary | ICD-10-CM | POA: Insufficient documentation

## 2021-04-17 DIAGNOSIS — I10 Essential (primary) hypertension: Secondary | ICD-10-CM

## 2021-04-17 DIAGNOSIS — E1165 Type 2 diabetes mellitus with hyperglycemia: Secondary | ICD-10-CM | POA: Diagnosis not present

## 2021-04-17 DIAGNOSIS — Z79899 Other long term (current) drug therapy: Secondary | ICD-10-CM | POA: Diagnosis not present

## 2021-04-17 DIAGNOSIS — Z8616 Personal history of COVID-19: Secondary | ICD-10-CM | POA: Diagnosis not present

## 2021-04-17 DIAGNOSIS — I4891 Unspecified atrial fibrillation: Secondary | ICD-10-CM | POA: Insufficient documentation

## 2021-04-17 DIAGNOSIS — I130 Hypertensive heart and chronic kidney disease with heart failure and stage 1 through stage 4 chronic kidney disease, or unspecified chronic kidney disease: Secondary | ICD-10-CM | POA: Insufficient documentation

## 2021-04-17 DIAGNOSIS — I5042 Chronic combined systolic (congestive) and diastolic (congestive) heart failure: Secondary | ICD-10-CM | POA: Insufficient documentation

## 2021-04-17 DIAGNOSIS — I2510 Atherosclerotic heart disease of native coronary artery without angina pectoris: Secondary | ICD-10-CM | POA: Insufficient documentation

## 2021-04-17 DIAGNOSIS — Z7982 Long term (current) use of aspirin: Secondary | ICD-10-CM | POA: Insufficient documentation

## 2021-04-17 DIAGNOSIS — Z7901 Long term (current) use of anticoagulants: Secondary | ICD-10-CM | POA: Diagnosis not present

## 2021-04-17 DIAGNOSIS — Z87891 Personal history of nicotine dependence: Secondary | ICD-10-CM | POA: Insufficient documentation

## 2021-04-17 DIAGNOSIS — Z8546 Personal history of malignant neoplasm of prostate: Secondary | ICD-10-CM | POA: Diagnosis not present

## 2021-04-17 DIAGNOSIS — Z91040 Latex allergy status: Secondary | ICD-10-CM | POA: Insufficient documentation

## 2021-04-17 DIAGNOSIS — R739 Hyperglycemia, unspecified: Secondary | ICD-10-CM

## 2021-04-17 DIAGNOSIS — R4182 Altered mental status, unspecified: Secondary | ICD-10-CM | POA: Diagnosis present

## 2021-04-17 LAB — URINALYSIS, COMPLETE (UACMP) WITH MICROSCOPIC
Bacteria, UA: NONE SEEN
Bilirubin Urine: NEGATIVE
Glucose, UA: >1000 mg/dL — AB
Hgb urine dipstick: NEGATIVE
Ketones, ur: NEGATIVE mg/dL
Leukocytes,Ua: NEGATIVE
Nitrite: NEGATIVE
Protein, ur: 100 mg/dL — AB
Specific Gravity, Urine: 1.02 (ref 1.005–1.030)
pH: 7.5 (ref 5.0–8.0)

## 2021-04-17 LAB — CBC
HCT: 41 % (ref 39.0–52.0)
Hemoglobin: 13.4 g/dL (ref 13.0–17.0)
MCH: 28.9 pg (ref 26.0–34.0)
MCHC: 32.7 g/dL (ref 30.0–36.0)
MCV: 88.6 fL (ref 80.0–100.0)
Platelets: 232 10*3/uL (ref 150–400)
RBC: 4.63 MIL/uL (ref 4.22–5.81)
RDW: 13.1 % (ref 11.5–15.5)
WBC: 9.3 10*3/uL (ref 4.0–10.5)
nRBC: 0 % (ref 0.0–0.2)

## 2021-04-17 LAB — COMPREHENSIVE METABOLIC PANEL
ALT: 19 U/L (ref 0–44)
AST: 20 U/L (ref 15–41)
Albumin: 3.6 g/dL (ref 3.5–5.0)
Alkaline Phosphatase: 123 U/L (ref 38–126)
Anion gap: 7 (ref 5–15)
BUN: 30 mg/dL — ABNORMAL HIGH (ref 8–23)
CO2: 28 mmol/L (ref 22–32)
Calcium: 8.9 mg/dL (ref 8.9–10.3)
Chloride: 99 mmol/L (ref 98–111)
Creatinine, Ser: 1.31 mg/dL — ABNORMAL HIGH (ref 0.61–1.24)
GFR, Estimated: 54 mL/min — ABNORMAL LOW (ref >60)
Glucose, Bld: 323 mg/dL — ABNORMAL HIGH (ref 70–99)
Potassium: 3.5 mmol/L (ref 3.5–5.1)
Sodium: 134 mmol/L — ABNORMAL LOW (ref 135–145)
Total Bilirubin: 0.5 mg/dL (ref 0.3–1.2)
Total Protein: 7.7 g/dL (ref 6.5–8.1)

## 2021-04-17 LAB — AMMONIA: Ammonia: 21 umol/L (ref 9–35)

## 2021-04-17 LAB — RESP PANEL BY RT-PCR (FLU A&B, COVID) ARPGX2
Influenza A by PCR: NEGATIVE
Influenza B by PCR: NEGATIVE
SARS Coronavirus 2 by RT PCR: NEGATIVE

## 2021-04-17 LAB — CBG MONITORING, ED: Glucose-Capillary: 283 mg/dL — ABNORMAL HIGH (ref 70–99)

## 2021-04-17 LAB — TSH: TSH: 2.413 u[IU]/mL (ref 0.350–4.500)

## 2021-04-17 LAB — TROPONIN I (HIGH SENSITIVITY): Troponin I (High Sensitivity): 44 ng/L — ABNORMAL HIGH (ref <18)

## 2021-04-17 MED ORDER — INSULIN ASPART 100 UNIT/ML IJ SOLN
0.0000 [IU] | INTRAMUSCULAR | Status: DC
  Administered 2021-04-17: 8 [IU] via SUBCUTANEOUS
  Administered 2021-04-17: 15 [IU] via SUBCUTANEOUS
  Filled 2021-04-17 (×2): qty 1

## 2021-04-17 NOTE — ED Notes (Signed)
First encounter with this patient, pt was left in quad room 21 in recliner. Pt slumped over, lethargic. Pt moved to hall bed for better visibility. Pt appears lethargic. Was able to wake pt with voice but pt could not answer orientation questions and promptly fell back asleep while this nurse was answering questions. Will transfer to bed and obtain VS.

## 2021-04-17 NOTE — ED Notes (Signed)
Son at bedside dressing pt.

## 2021-04-17 NOTE — ED Notes (Signed)
Called son [name] who is POA. They will come with clothes for pt and to pick up pt to bring home in about 1.5 hr. EDP already spoke with family.

## 2021-04-17 NOTE — Discharge Instructions (Addendum)
Please resume your medicines as you have been using them earlier today.  Check your fingersticks 4 times a day contact your doctor or return to the emergency room if your sugars are below 60 or above 350.  Follow-up with your primary care doctor in the next few days.

## 2021-04-17 NOTE — ED Notes (Signed)
Provided sandwich tray to pt.

## 2021-04-17 NOTE — ED Triage Notes (Signed)
Report received from ems, pt's wife called due to pt being altered at home, ems reports pt was yelling at his wife and had a strong odor of urine about him, states hx of diabetes, fsbs of 357, 181/76, 84p, 22 resp, 96% RA, 35 end tidal co2, bilat below the knee amputation, hx of a.fib 20g left ac, 250 ml of ns given by ems

## 2021-04-17 NOTE — ED Provider Notes (Signed)
[facility], [place] Emergency Department Provider Note  ____________________________________________   Event Date/Time   First MD Initiated Contact with Patient 04/17/21 1256     (approximate)  I have reviewed the triage vital signs and the nursing notes.   HISTORY  Chief Complaint Altered Mental Status   HPI [name] is a 86 y.o. male with past medical history  with medical history significant for CAD, HTN, PAD s/p bilateral BKA, paroxysmal A. fib on Xarelto, systolic heart failure (EF 35 to 40% 12/14/2020), CKD 3A, seizure disorder, chronic pain, and recent admission 10/14 to 10/19 for some delirium in the setting of progressive early subacute memory difficulties and intermittent confusion per family who presents via EMS for assessment of some ongoing confusion.  Patient unable to write any history on arrival secondary to baseline confusion.  He is denying pain or other acute symptoms but unable to state why he is here.  I was able to reach his son and spouse who explained to me that after being discharged from his recent hospitalization he was at [facility] and was supposed to stay there long-term with concerns that he may have undiagnosed dementia or other cognitive deficits requiring long-term care but that patient was fairly miserable there and that family had made the decision to bring him home.  They are planning to arrange for home nursing to come help next week but are worried that since coming home he just "has not been himself" and that he is been very confused.  Does not seem that this is no different than from when he was hospitalized and son states he is also worried about some dementia.  Patient has not had any recent falls or injuries that he is aware of or other sick symptoms including fevers, cough, vomiting or other acute concerns.      Past Medical History:  Diagnosis Date   Arthritis    Atrial fibrillation ([place])    Carcinoma of  prostate ([place])    CHF (congestive heart failure) ([place])    Chronic kidney disease    Diabetes mellitus without complication [facility])    ED (erectile dysfunction)    Frequent urination    Hyperlipidemia    Hypertension    Moderate mitral insufficiency    Peripheral vascular disease ([place])    Pneumonia 04/2016   Prostate cancer ([place])    Sleep apnea    OSA--USE C-PAP   Ulcer of left foot due to type 2 diabetes mellitus ([place])    Urinary stress incontinence, male     Patient Active Problem List   Diagnosis Date Noted   Chronic systolic CHF (congestive heart failure) ([place]) 03/06/2021   Hyperglycemia due to type 2 diabetes mellitus ([place]) 11/10/2020   GERD (gastroesophageal reflux disease) 11/10/2020   Chest pain 11/09/2020   Acute encephalopathy 07/29/2020   Lactic acidosis 07/28/2020   Hypoxic 07/28/2020   Chronic prescription opiate use 07/28/2020   Elevated troponin 07/28/2020   Chronic anticoagulation 06/25/2020   Chronic kidney disease, stage 3a ([place]) 06/25/2020   AMS (altered mental status) 04/08/2020   CAD (coronary artery disease) 03/06/2020   Personal history of COVID-19 03/06/2020   Acute toxic-metabolic encephalopathy 76/19/5093   UTI (urinary tract infection) 03/06/2020   (HFpEF) heart failure with preserved ejection fraction ([place]) 03/06/2020   Goals of care, counseling/discussion    Palliative care by specialist    Seizure disorder ([place]) 11/30/2019   ACS (acute coronary syndrome) ([place]) 06/24/2019   Sinus bradycardia on ECG  05/16/2019   PAF (paroxysmal atrial fibrillation) ([place]) 03/27/2019   Hx of BKA, right ([place]) 03/02/2019   Severe protein-calorie malnutrition ([place]) 22/97/9892   Acute systolic CHF (congestive heart failure) ([place]) 02/16/2019   Acute respiratory failure with hypoxia ([place]) 02/08/2019   Pneumonia due to COVID-19 virus 02/08/2019   Below-knee amputation of right lower extremity ([place]) 01/15/2019   Polyp of descending colon    Iron deficiency anemia due  to chronic blood loss    Sacral decubitus ulcer, stage II ([place]) 12/26/2018   Acute respiratory failure ([place]) 12/20/2018   Femur fracture ([place]) 10/13/2018   Nonspecific chest pain 06/03/2018   NSTEMI (non-ST elevated myocardial infarction) ([place]) 01/16/2018   Acute CHF (congestive heart failure) ([place]) 11/23/2017   Atrial fibrillation with rapid ventricular response ([place]) 10/15/2017   BKA stump complication ([place]) 11/94/1740   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 ([place]) 02/10/2017   S/P bilateral BKA (below knee amputation) ([place]) 02/01/2017   Chronic diastolic heart failure ([place]) 01/21/2017   Pressure injury of skin 01/18/2017   Atherosclerosis of artery of extremity with gangrene ([place]) 01/05/2017   Diabetic foot ulcer ([place]) 12/29/2016   Ataxia 10/21/2016   Atherosclerosis of native arteries of the extremities with ulceration ([place]) 10/12/2016   History of femoral angiogram 09/06/2016   Left leg pain 09/06/2016   Leukocytosis 09/06/2016   Generalized weakness 09/06/2016   AKI (acute kidney injury) ([place]) 09/06/2016   Atrial fibrillation, chronic ([place]) 08/30/2016   Pneumonia 05/19/2016   Hyperlipidemia 04/20/2016   Back pain 04/19/2016   DM (diabetes mellitus), type 2 with complications ([place]) 81/44/8185   Primary hypertension 04/19/2016   PAD (peripheral artery disease) ([place]) 04/19/2016   Erectile dysfunction following radical prostatectomy 06/25/2015   History of prostate cancer 12/23/2014   Incontinence 12/23/2014    Past Surgical History:  Procedure Laterality Date   AMPUTATION Left 01/12/2017   Procedure: AMPUTATION BELOW KNEE;  Surgeon: Algernon Huxley, MD;  Location: [facility] ORS;  Service: General;  Laterality: Left;   AMPUTATION Right 12/27/2018   Procedure: AMPUTATION BELOW KNEE;  Surgeon: Algernon Huxley, MD;  Location: [facility] ORS;  Service: General;  Laterality: Right;   APLIGRAFT PLACEMENT Left 10/15/2016   Procedure: APLIGRAFT PLACEMENT;  Surgeon: Albertine Patricia,  DPM;  Location: [facility] ORS;  Service: Podiatry;  Laterality: Left;  necrotic ulcer   CHOLECYSTECTOMY     COLONOSCOPY WITH PROPOFOL N/A 01/02/2019   Procedure: COLONOSCOPY WITH PROPOFOL;  Surgeon: Lucilla Lame, MD;  Location: [facility] ENDOSCOPY;  Service: Endoscopy;  Laterality: N/A;   COLONOSCOPY WITH PROPOFOL N/A 01/05/2019   Procedure: COLONOSCOPY WITH PROPOFOL;  Surgeon: Lucilla Lame, MD;  Location: [facility] ENDOSCOPY;  Service: Endoscopy;  Laterality: N/A;   ESOPHAGOGASTRODUODENOSCOPY (EGD) WITH PROPOFOL N/A 01/02/2019   Procedure: ESOPHAGOGASTRODUODENOSCOPY (EGD) WITH PROPOFOL;  Surgeon: Lucilla Lame, MD;  Location: [facility] ENDOSCOPY;  Service: Endoscopy;  Laterality: N/A;   EYE SURGERY Bilateral    Cataract Extraction with IOL   HIP ARTHROPLASTY Right 10/13/2018   Procedure: ARTHROPLASTY BIPOLAR HIP (HEMIARTHROPLASTY);  Surgeon: Earnestine Leys, MD;  Location: [facility] ORS;  Service: Orthopedics;  Laterality: Right;   IRRIGATION AND DEBRIDEMENT FOOT Left 10/15/2016   Procedure: IRRIGATION AND DEBRIDEMENT FOOT-EXCISIONAL DEBRIDEMENT OF SKIN 3RD, 4TH AND 5TH TOES WITH APPLICATION OF APLIGRAFT;  Surgeon: Albertine Patricia, DPM;  Location: [facility] ORS;  Service: Podiatry;  Laterality: Left;  necrotic,gangrene   IRRIGATION AND DEBRIDEMENT FOOT Left 12/09/2016   Procedure: IRRIGATION AND DEBRIDEMENT FOOT-MUSCLE/FASCIA, RESECTION OF FOURTH AND FIFTH METATARSAL NECROTIC BONE;  Surgeon: Albertine Patricia, DPM;  Location: [facility] ORS;  Service: Podiatry;  Laterality: Left;   IRRIGATION AND DEBRIDEMENT FOOT Right 12/23/2018   Procedure: IRRIGATION AND DEBRIDEMENT FOOT and wound vac application;  Surgeon: Samara Deist, DPM;  Location: [facility] ORS;  Service: Podiatry;  Laterality: Right;   LEFT HEART CATH N/A 06/25/2019   Procedure: Left Heart Cath and Coronary Angiography;  Surgeon: Isaias Cowman, MD;  Location: [place] CV LAB;  Service: Cardiovascular;  Laterality: N/A;   LEFT HEART CATH AND CORONARY ANGIOGRAPHY N/A  06/05/2018   Procedure: LEFT HEART CATH AND CORONARY ANGIOGRAPHY;  Surgeon: Yolonda Kida, MD;  Location: [place] CV LAB;  Service: Cardiovascular;  Laterality: N/A;   LOWER EXTREMITY ANGIOGRAPHY Left 08/16/2016   Procedure: Lower Extremity Angiography;  Surgeon: Algernon Huxley, MD;  Location: [place] CV LAB;  Service: Cardiovascular;  Laterality: Left;   LOWER EXTREMITY ANGIOGRAPHY Left 09/01/2016   Procedure: Lower Extremity Angiography;  Surgeon: Algernon Huxley, MD;  Location: [place] CV LAB;  Service: Cardiovascular;  Laterality: Left;   LOWER EXTREMITY ANGIOGRAPHY Left 10/14/2016   Procedure: Lower Extremity Angiography;  Surgeon: Algernon Huxley, MD;  Location: [place] CV LAB;  Service: Cardiovascular;  Laterality: Left;   LOWER EXTREMITY ANGIOGRAPHY Left 12/20/2016   Procedure: Lower Extremity Angiography;  Surgeon: Algernon Huxley, MD;  Location: [place] CV LAB;  Service: Cardiovascular;  Laterality: Left;   LOWER EXTREMITY ANGIOGRAPHY Right 12/18/2018   Procedure: LOWER EXTREMITY ANGIOGRAPHY;  Surgeon: Algernon Huxley, MD;  Location: [place] CV LAB;  Service: Cardiovascular;  Laterality: Right;   LOWER EXTREMITY INTERVENTION  09/01/2016   Procedure: Lower Extremity Intervention;  Surgeon: Algernon Huxley, MD;  Location: [place] CV LAB;  Service: Cardiovascular;;   PROSTATE SURGERY     removal   TONSILLECTOMY     as a child    Prior to Admission medications   Medication Sig Start Date End Date Taking? Authorizing Provider  aspirin 81 MG EC tablet Take 81 mg by mouth daily.    [provider]  atorvastatin (LIPITOR) 80 MG tablet Take 80 mg by mouth at bedtime.    [provider]  bisacodyl (DULCOLAX) 5 MG EC tablet Take 1 tablet (5 mg total) by mouth daily as needed for moderate constipation. 04/10/20   Lorella Nimrod, MD  diltiazem (CARDIZEM CD) 120 MG 24 hr capsule Take 120 mg by mouth daily. 10/04/20   [provider]  divalproex  (DEPAKOTE) 500 MG DR tablet Take 500 mg by mouth daily.  02/18/20   [provider]  Ensure Max Protein (ENSURE MAX PROTEIN) LIQD Take 330 mLs (11 oz total) by mouth 2 (two) times daily. 04/10/20   Lorella Nimrod, MD  ferrous sulfate 325 (65 FE) MG tablet Take 325 mg by mouth daily with breakfast.    [provider]  Insulin Glargine (BASAGLAR KWIKPEN) 100 UNIT/ML Inject 20 Units into the skin 2 (two) times daily. 03/11/21   [name] Mandes, MD  isosorbide mononitrate (IMDUR) 120 MG 24 hr tablet Take 1 tablet (120 mg total) by mouth daily. 10/26/20   [name] Mandes, MD  metoprolol succinate (TOPROL-XL) 25 MG 24 hr tablet Take 1 tablet (25 mg total) by mouth daily. 03/12/21   [name] Mandes, MD  Multiple Vitamin (MULTIVITAMIN) tablet Take 1 tablet by mouth daily.    [provider]  nitroGLYCERIN (NITROSTAT) 0.4 MG SL tablet Place 1 tablet (0.4 mg total) under the tongue every 5 (five)  minutes as needed for chest pain. 10/25/20   [name] Mandes, MD  NOVOLOG FLEXPEN 100 UNIT/ML FlexPen Inject 6 Units into the skin 3 (three) times daily with meals. Patient taking differently: Inject 4 Units into the skin 3 (three) times daily with meals. 11/10/20   Sharen Hones, MD  Rivaroxaban (XARELTO) 15 MG TABS tablet Take 1 tablet (15 mg total) by mouth daily with supper. Hold Xarelto for 3 days.  Start on January 09, 2019. Patient taking differently: Take 15 mg by mouth daily with supper. 01/09/19   [name], MD    Allergies Contrast media [iodinated diagnostic agents], Iohexol, Metrizamide, and Latex  Family History  Problem Relation Age of Onset   Hypertension Mother    Diabetes Mother    Prostate cancer Neg Hx    Bladder Cancer Neg Hx    Kidney disease Neg Hx     Social History Social History   Tobacco Use   Smoking status: Former    Packs/day: 0.25    Types: Cigarettes    Quit date: 10/14/1994    Years since quitting: 26.5   Smokeless tobacco: Never  Vaping Use   Vaping Use: Never  used  Substance Use Topics   Alcohol use: No    Alcohol/week: 0.0 standard drinks   Drug use: No    Review of Systems  Review of Systems  Unable to perform ROS: Mental status change     ____________________________________________   PHYSICAL EXAM:  VITAL SIGNS: ED Triage Vitals [04/17/21 1000]  Enc Vitals Group     BP (!) 170/76     Pulse Rate 65     Resp 18     Temp 97.9 F (36.6 C)     Temp Source Oral     SpO2 94 %     Weight 170 lb (77.1 kg)     Height 5\' 10"  (1.778 m)     Head Circumference      [facility] Flow      Pain Score 0     Pain Loc      Pain Edu?      Excl. in [place]?    Vitals:   04/17/21 1000  BP: (!) 170/76  Pulse: 65  Resp: 18  Temp: 97.9 F (36.6 C)  SpO2: 94%   Physical Exam Vitals and nursing note reviewed.  Constitutional:      General: He is not in acute distress.    Appearance: He is well-developed.  HENT:     Head: Normocephalic and atraumatic.  Eyes:     Conjunctiva/sclera: Conjunctivae normal.  Cardiovascular:     Rate and Rhythm: Normal rate and regular rhythm.     Heart sounds: No murmur heard. Pulmonary:     Effort: Pulmonary effort is normal. No respiratory distress.     Breath sounds: Normal breath sounds.  Abdominal:     Palpations: Abdomen is soft.     Tenderness: There is no abdominal tenderness.  Musculoskeletal:        General: No swelling.     Cervical back: Neck supple.     Right Lower Extremity: Right leg is amputated below knee.     Left Lower Extremity: Left leg is amputated below knee.  Skin:    General: Skin is warm and dry.     Capillary Refill: Capillary refill takes less than 2 seconds.  Neurological:     Mental Status: He is alert. He is disoriented and confused.  Psychiatric:  Mood and Affect: Mood normal.     ____________________________________________   LABS (all labs ordered are listed, but only abnormal results are displayed)  Labs Reviewed  COMPREHENSIVE METABOLIC PANEL - Abnormal;  Notable for the following components:      Result Value   Sodium 134 (*)    Glucose, Bld 323 (*)    BUN 30 (*)    Creatinine, Ser 1.31 (*)    GFR, Estimated 54 (*)    All other components within normal limits  URINALYSIS, COMPLETE (UACMP) WITH MICROSCOPIC - Abnormal; Notable for the following components:   APPearance CLEAR (*)    Glucose, UA >1,000 (*)    Protein, ur 100 (*)    All other components within normal limits  BLOOD GAS, VENOUS - Abnormal; Notable for the following components:   pO2, Ven <31.0 (*)    Bicarbonate 30.4 (*)    Acid-Base Excess 4.5 (*)    All other components within normal limits  TROPONIN I (HIGH SENSITIVITY) - Abnormal; Notable for the following components:   Troponin I (High Sensitivity) 44 (*)    All other components within normal limits  RESP PANEL BY RT-PCR (FLU A&B, COVID) ARPGX2  CBC  TSH  AMMONIA  CBG MONITORING, ED   ____________________________________________  EKG  ECG remarkable for A. fib with a rate of 65 and some nonspecific ST depressions in inferior lateral leads without other clear evidence of acute ischemia.  These changes all appear very similar to those noted on EKG obtained on 03/11/2021. ____________________________________________  RADIOLOGY  ED MD interpretation: CT head shows no evidence of hemorrhage, ischemia, mass-effect or other acute process.  Official radiology report(s): CT HEAD WO CONTRAST (5MM)  Result Date: 04/17/2021 CLINICAL DATA:  Mental status change, unknown cause EXAM: CT HEAD WITHOUT CONTRAST TECHNIQUE: Contiguous axial images were obtained from the base of the skull through the vertex without intravenous contrast. COMPARISON:  03/06/2021 FINDINGS: Motion artifact is present through inferior slices. Brain: There is no acute intracranial hemorrhage, mass effect, or edema. Gray-white differentiation is preserved. There is no extra-axial fluid collection. Prominence of the ventricles and sulci reflects stable  parenchymal volume loss. Patchy low-density in the supratentorial white matter is nonspecific but probably reflects stable chronic microvascular ischemic changes. Vascular: There is atherosclerotic calcification at the skull base. Skull: Calvarium is unremarkable. Sinuses/Orbits: No acute finding. Other: None. IMPRESSION: No acute intracranial abnormality or significant change since prior study. Electronically Signed   By: Macy Mis M.D.   On: 04/17/2021 13:32    ____________________________________________   PROCEDURES  Procedure(s) performed (including Critical Care):  Procedures   ____________________________________________   INITIAL IMPRESSION / ASSESSMENT AND PLAN / ED COURSE      Patient presents with above-stated history exam for assessment of ongoing confusion after recently coming home from [facility] on 11/19 after family decided to bring him home because he did not want to be there anymore.  It seems that long-term care have been recommended after his most recent hospitalization with concerns for some cognitive deficits although patient has not been formally diagnosed with dementia.  On arrival patient is afebrile and hypertensive otherwise stable vital signs.  He is moving all extremities spontaneously denying any pain and does not appear to be in significant distress.  He is not sure why he is here.  He was able to reach his son and spouse who provided extensive history noted above and it seems that they are also concerned for some underlying dementia and that  this confusion has been waxing and waning but going on at least several months.  They have plans to have home nursing come next week with not yet.  Some notes they want to check for UTI as patient had a strong smell of urine.  I suspect possible progression possible underlying dementia versus other not immediate life-threatening pathology.  There are no focal deficits at this time to suggest CVA and seems at all of  these issues have been waxing waning over several months.  No history of recent trauma.  CT head shows no active hemorrhage or mass.  ECG remarkable for A. fib with a rate of 65 and some nonspecific ST depressions in inferior lateral leads without other clear evidence of acute ischemia.  These changes all appear very similar to those noted on EKG obtained on 03/11/2021.  Troponin at 44 compared to 41 and 39 a month ago with no significant changes on EKG today is not suggestive of atypical presentation for ACS or significant arrhythmia.  CBC shows no leukocytosis or acute anemia.  CMP shows a glucose of 323 without any other significant electrolyte or metabolic derangements.  UA is not suggestive of cystitis.  TSH is unremarkable.  VBG without evidence of hypercarbic respiratory failure.  CT head shows no evidence of hemorrhage, ischemia, mass-effect or other acute process.  After extensive discussion with patient's son and spouse they feel that he is safe at home at this time and they are amenable to follow-up with PCP and neurology.  I did offer for patient to be evaluated PT and OT and social worker to attempt placement and long-term care facility again although they declined this stating they prefer patient to be back home.  I think this is reasonable.  Discharged back to family with plan for outpatient follow-up and in-home nursing help to start next week.  Advised he can come immediately back to emergency room for any decline in symptoms or concern for patient's safety.      ____________________________________________   FINAL CLINICAL IMPRESSION(S) / ED DIAGNOSES  Final diagnoses:  Confusion  Hypertension, unspecified type  Hyperglycemia    Medications  insulin aspart (novoLOG) injection 0-15 Units (15 Units Subcutaneous Given 04/17/21 1358)     ED Discharge Orders     None        Note:  This document was prepared using Dragon voice recognition software and may include  unintentional dictation errors.    Lucrezia Starch, MD 04/17/21 [phone]

## 2021-04-17 NOTE — ED Notes (Signed)
Pt noted to have bilateral BKA amputations with prosthetic legs. EDP examined legs. Pt now going to CT.

## 2021-04-21 DIAGNOSIS — F39 Unspecified mood [affective] disorder: Secondary | ICD-10-CM | POA: Insufficient documentation

## 2021-04-21 DIAGNOSIS — N390 Urinary tract infection, site not specified: Secondary | ICD-10-CM | POA: Insufficient documentation

## 2021-04-27 LAB — BLOOD GAS, VENOUS
Acid-Base Excess: 4.5 mmol/L — ABNORMAL HIGH (ref 0.0–2.0)
Bicarbonate: 30.4 mmol/L — ABNORMAL HIGH (ref 20.0–28.0)
O2 Saturation: 57.4 %
Patient temperature: 37
pCO2, Ven: 49 mmHg (ref 44.0–60.0)
pH, Ven: 7.4 (ref 7.250–7.430)
pO2, Ven: <31 mmHg — CL (ref 32.0–45.0)

## 2021-05-19 ENCOUNTER — Encounter (INDEPENDENT_AMBULATORY_CARE_PROVIDER_SITE_OTHER): Payer: Self-pay | Admitting: Vascular Surgery

## 2021-05-19 ENCOUNTER — Other Ambulatory Visit: Payer: Self-pay

## 2021-05-19 ENCOUNTER — Ambulatory Visit (INDEPENDENT_AMBULATORY_CARE_PROVIDER_SITE_OTHER): Payer: Medicare Other | Admitting: Vascular Surgery

## 2021-05-19 VITALS — BP 133/76 | HR 97 | Resp 18 | Ht 67.0 in | Wt 180.0 lb

## 2021-05-19 DIAGNOSIS — Z89511 Acquired absence of right leg below knee: Secondary | ICD-10-CM | POA: Diagnosis not present

## 2021-05-19 DIAGNOSIS — Z89512 Acquired absence of left leg below knee: Secondary | ICD-10-CM | POA: Diagnosis not present

## 2021-05-19 DIAGNOSIS — E118 Type 2 diabetes mellitus with unspecified complications: Secondary | ICD-10-CM

## 2021-05-19 DIAGNOSIS — E785 Hyperlipidemia, unspecified: Secondary | ICD-10-CM | POA: Diagnosis not present

## 2021-05-19 DIAGNOSIS — I10 Essential (primary) hypertension: Secondary | ICD-10-CM

## 2021-05-19 DIAGNOSIS — I480 Paroxysmal atrial fibrillation: Secondary | ICD-10-CM

## 2021-05-19 NOTE — Assessment & Plan Note (Signed)
These are now poorly fitting and painful.  New Rx for bilateral BKA prosthesis given today.

## 2021-05-19 NOTE — Progress Notes (Signed)
MRN : [id number]  [name] is a 86 y.o. (1934-06-25) male who presents with chief complaint of  Chief Complaint  Patient presents with   Follow-up    Prescription for prosthetic leg  .  History of Present Illness: Patient returns today in follow up of his BKA prosthesis.  They have become poorly fitting and painful.  This is progressed over several months time.  Otherwise, he has no new complaints today  Current Outpatient Medications  Medication Sig Dispense Refill   aspirin 81 MG EC tablet Take 81 mg by mouth daily.     atorvastatin (LIPITOR) 80 MG tablet Take 80 mg by mouth at bedtime.     bisacodyl (DULCOLAX) 5 MG EC tablet Take 1 tablet (5 mg total) by mouth daily as needed for moderate constipation. 30 tablet 0   diltiazem (CARDIZEM CD) 120 MG 24 hr capsule Take 120 mg by mouth daily.     divalproex (DEPAKOTE) 500 MG DR tablet Take 500 mg by mouth daily.      Ensure Max Protein (ENSURE MAX PROTEIN) LIQD Take 330 mLs (11 oz total) by mouth 2 (two) times daily.     ferrous sulfate 325 (65 FE) MG tablet Take 325 mg by mouth daily with breakfast.     FLUoxetine (PROZAC) 20 MG capsule Take 20 mg by mouth daily.     Insulin Glargine (BASAGLAR KWIKPEN) 100 UNIT/ML Inject 20 Units into the skin 2 (two) times daily.     isosorbide mononitrate (IMDUR) 120 MG 24 hr tablet Take 1 tablet (120 mg total) by mouth daily. 30 tablet 0   metoprolol succinate (TOPROL-XL) 25 MG 24 hr tablet Take 1 tablet (25 mg total) by mouth daily. 30 tablet 1   Multiple Vitamin (MULTIVITAMIN) tablet Take 1 tablet by mouth daily.     nitroGLYCERIN (NITROSTAT) 0.4 MG SL tablet Place 1 tablet (0.4 mg total) under the tongue every 5 (five) minutes as needed for chest pain. 20 tablet 12   NOVOLOG FLEXPEN 100 UNIT/ML FlexPen Inject 6 Units into the skin 3 (three) times daily with meals. (Patient taking differently: Inject 4 Units into the skin 3 (three) times daily with meals.) 15 mL 0   Rivaroxaban  (XARELTO) 15 MG TABS tablet Take 1 tablet (15 mg total) by mouth daily with supper. Hold Xarelto for 3 days.  Start on January 09, 2019. (Patient taking differently: Take 15 mg by mouth daily with supper.) 30 tablet 1   No current facility-administered medications for this visit.    Past Medical History:  Diagnosis Date   Arthritis    Atrial fibrillation ([place])    Carcinoma of prostate ([place])    CHF (congestive heart failure) ([place])    Chronic kidney disease    Diabetes mellitus without complication [facility])    ED (erectile dysfunction)    Frequent urination    Hyperlipidemia    Hypertension    Moderate mitral insufficiency    Peripheral vascular disease ([place])    Pneumonia 04/2016   Prostate cancer ([place])    Sleep apnea    OSA--USE C-PAP   Ulcer of left foot due to type 2 diabetes mellitus ([place])    Urinary stress incontinence, male     Past Surgical History:  Procedure Laterality Date   AMPUTATION Left 01/12/2017   Procedure: AMPUTATION BELOW KNEE;  Surgeon: Algernon Huxley, MD;  Location: [facility] ORS;  Service: General;  Laterality: Left;   AMPUTATION Right 12/27/2018   Procedure: AMPUTATION  BELOW KNEE;  Surgeon: Algernon Huxley, MD;  Location: [facility] ORS;  Service: General;  Laterality: Right;   APLIGRAFT PLACEMENT Left 10/15/2016   Procedure: APLIGRAFT PLACEMENT;  Surgeon: Albertine Patricia, DPM;  Location: [facility] OR[facility];  Service: Podiatry;  Laterality: Left;  necrotic ulcer   CHOLECYSTECTOMY     COLONOSCOPY WITH PROPOFOL N/A 01/02/2019   Procedure: COLONOSCOPY WITH PROPOFOL;  Surgeon: Lucilla Lame, MD;  Location: [facility] ENDOSCOPY;  Service: Endoscopy;  Laterality: N/A;   COLONOSCOPY WITH PROPOFOL N/A 01/05/2019   Procedure: COLONOSCOPY WITH PROPOFOL;  Surgeon: Lucilla Lame, MD;  Location: [facility] ENDOSCOPY;  Service: Endoscopy;  Laterality: N/A;   ESOPHAGOGASTRODUODENOSCOPY (EGD) WITH PROPOFOL N/A 01/02/2019   Procedure: ESOPHAGOGASTRODUODENOSCOPY (EGD) WITH PROPOFOL;  Surgeon: Lucilla Lame, MD;  Location:  [facility] ENDOSCOPY;  Service: Endoscopy;  Laterality: N/A;   EYE SURGERY Bilateral    Cataract Extraction with IOL   HIP ARTHROPLASTY Right 10/13/2018   Procedure: ARTHROPLASTY BIPOLAR HIP (HEMIARTHROPLASTY);  Surgeon: Earnestine Leys, MD;  Location: [facility] ORS;  Service: Orthopedics;  Laterality: Right;   IRRIGATION AND DEBRIDEMENT FOOT Left 10/15/2016   Procedure: IRRIGATION AND DEBRIDEMENT FOOT-EXCISIONAL DEBRIDEMENT OF SKIN 3RD, 4TH AND 5TH TOES WITH APPLICATION OF APLIGRAFT;  Surgeon: Albertine Patricia, DPM;  Location: [facility];  Service: Podiatry;  Laterality: Left;  necrotic,gangrene   IRRIGATION AND DEBRIDEMENT FOOT Left 12/09/2016   Procedure: IRRIGATION AND DEBRIDEMENT FOOT-MUSCLE/FASCIA, RESECTION OF FOURTH AND FIFTH METATARSAL NECROTIC BONE;  Surgeon: Albertine Patricia, DPM;  Location: [facility];  Service: Podiatry;  Laterality: Left;   IRRIGATION AND DEBRIDEMENT FOOT Right 12/23/2018   Procedure: IRRIGATION AND DEBRIDEMENT FOOT and wound vac application;  Surgeon: Samara Deist, DPM;  Location: [facility] OR[facility];  Service: Podiatry;  Laterality: Right;   LEFT HEART CATH N/A 06/25/2019   Procedure: Left Heart Cath and Coronary Angiography;  Surgeon: Isaias Cowman, MD;  Location: [place] CV LAB;  Service: Cardiovascular;  Laterality: N/A;   LEFT HEART CATH AND CORONARY ANGIOGRAPHY N/A 06/05/2018   Procedure: LEFT HEART CATH AND CORONARY ANGIOGRAPHY;  Surgeon: Yolonda Kida, MD;  Location: [place] CV LAB;  Service: Cardiovascular;  Laterality: N/A;   LOWER EXTREMITY ANGIOGRAPHY Left 08/16/2016   Procedure: Lower Extremity Angiography;  Surgeon: Algernon Huxley, MD;  Location: [place] CV LAB;  Service: Cardiovascular;  Laterality: Left;   LOWER EXTREMITY ANGIOGRAPHY Left 09/01/2016   Procedure: Lower Extremity Angiography;  Surgeon: Algernon Huxley, MD;  Location: [place] CV LAB;  Service: Cardiovascular;  Laterality: Left;   LOWER EXTREMITY ANGIOGRAPHY Left 10/14/2016   Procedure: Lower  Extremity Angiography;  Surgeon: Algernon Huxley, MD;  Location: [place] CV LAB;  Service: Cardiovascular;  Laterality: Left;   LOWER EXTREMITY ANGIOGRAPHY Left 12/20/2016   Procedure: Lower Extremity Angiography;  Surgeon: Algernon Huxley, MD;  Location: [place] CV LAB;  Service: Cardiovascular;  Laterality: Left;   LOWER EXTREMITY ANGIOGRAPHY Right 12/18/2018   Procedure: LOWER EXTREMITY ANGIOGRAPHY;  Surgeon: Algernon Huxley, MD;  Location: [place] CV LAB;  Service: Cardiovascular;  Laterality: Right;   LOWER EXTREMITY INTERVENTION  09/01/2016   Procedure: Lower Extremity Intervention;  Surgeon: Algernon Huxley, MD;  Location: [place] CV LAB;  Service: Cardiovascular;;   PROSTATE SURGERY     removal   TONSILLECTOMY     as a child     Social History   Tobacco Use   Smoking status: Former    Packs/day: 0.25    Types: Cigarettes    Quit date: 10/14/1994  Years since quitting: 26.6   Smokeless tobacco: Never  Vaping Use   Vaping Use: Never used  Substance Use Topics   Alcohol use: No    Alcohol/week: 0.0 standard drinks   Drug use: No       Family History  Problem Relation Age of Onset   Hypertension Mother    Diabetes Mother    Prostate cancer Neg Hx    Bladder Cancer Neg Hx    Kidney disease Neg Hx      Allergies  Allergen Reactions   Contrast Media [Iodinated Contrast Media] Shortness Of Breath    SOB   Iohexol Shortness Of Breath     Desc: Respiratory Distress, Laryngedema, diaphoresis, Onset Date: [id number]    Metrizamide Shortness Of Breath    SOB SOB SOB    Latex Rash   REVIEW OF SYSTEMS (Negative unless checked)   Constitutional: [] Weight loss  [] Fever  [] Chills Cardiac: [] Chest pain   [] Chest pressure   [x] Palpitations   [] Shortness of breath when laying flat   [] Shortness of breath at rest   [x] Shortness of breath with exertion. Vascular:  [] Pain in legs with walking   [] Pain in legs at rest   [] Pain in legs when laying flat    [] Claudication   [] Pain in feet when walking  [] Pain in feet at rest  [] Pain in feet when laying flat   [] History of DVT   [] Phlebitis   [] Swelling in legs   [] Varicose veins   [] Non-healing ulcers Pulmonary:   [] Uses home oxygen   [] Productive cough   [] Hemoptysis   [] Wheeze  [] COPD   [] Asthma Neurologic:  [] Dizziness  [] Blackouts   [] Seizures   [] History of stroke   [] History of TIA  [] Aphasia   [] Temporary blindness   [] Dysphagia   [] Weakness or numbness in arms   [] Weakness or numbness in legs Musculoskeletal:  [x] Arthritis   [] Joint swelling   [x] Joint pain   [] Low back pain Hematologic:  [] Easy bruising  [] Easy bleeding   [] Hypercoagulable state   [] Anemic   Gastrointestinal:  [] Blood in stool   [] Vomiting blood  [] Gastroesophageal reflux/heartburn   [] Abdominal pain Genitourinary:  [] Chronic kidney disease   [] Difficult urination  [] Frequent urination  [] Burning with urination   [] Hematuria Skin:  [] Rashes   [] Ulcers   [] Wounds Psychological:  [] History of anxiety   []  History of major depression.  Physical Examination  BP 133/76 (BP Location: Left Arm)    Pulse 97    Resp 18    Ht 5\' 7"  (1.702 m)    Wt 180 lb (81.6 kg)    BMI 28.19 kg/m  Gen:  WD/WN, NAD Head: [place]/AT, No temporalis wasting. Ear/Nose/Throat: Hearing grossly intact, nares w/o erythema or drainage Eyes: Conjunctiva clear. Sclera non-icteric Neck: Supple.  Trachea midline Pulmonary:  Good air movement, no use of accessory muscles.  Cardiac: irregular Vascular:  Vessel Right Left  Radial Palpable Palpable                          PT Not Palpable Not Palpable  DP Not Palpable Not Palpable   Musculoskeletal: M/S 5/5 throughout.  Bilateral BKAs are well healed Neurologic: Sensation grossly intact in extremities.  Symmetrical.  Speech is fluent.  Psychiatric: Judgment intact, Mood & affect appropriate for pt's clinical situation. Dermatologic: No rashes or ulcers noted.  No cellulitis or open  wounds.      Labs Recent Results (from the past 2160 hour(s))  CBG monitoring, ED     Status: Abnormal   Collection Time: 03/06/21 12:38 AM  Result Value Ref Range   Glucose-Capillary 467 (H) 70 - 99 mg/dL    Comment: Glucose reference range applies only to samples taken after fasting for at least 8 hours.  CBC with Differential     Status: None   Collection Time: 03/06/21 12:39 AM  Result Value Ref Range   WBC 8.9 4.0 - 10.5 K/uL   RBC 4.70 4.22 - 5.81 MIL/uL   Hemoglobin 14.2 13.0 - 17.0 g/dL   HCT 40.3 39.0 - 52.0 %   MCV 85.7 80.0 - 100.0 fL   MCH 30.2 26.0 - 34.0 pg   MCHC 35.2 30.0 - 36.0 g/dL   RDW 13.1 11.5 - 15.5 %   Platelets 202 150 - 400 K/uL   nRBC 0.0 0.0 - 0.2 %   Neutrophils Relative % 64 %   Neutro Abs 5.7 1.7 - 7.7 K/uL   Lymphocytes Relative 21 %   Lymphs Abs 1.9 0.7 - 4.0 K/uL   Monocytes Relative 9 %   Monocytes Absolute 0.8 0.1 - 1.0 K/uL   Eosinophils Relative 5 %   Eosinophils Absolute 0.4 0.0 - 0.5 K/uL   Basophils Relative 1 %   Basophils Absolute 0.1 0.0 - 0.1 K/uL   Immature Granulocytes 0 %   Abs Immature Granulocytes 0.03 0.00 - 0.07 K/uL    Comment: Performed at [facility], [address] [place], [place] [address]  Comprehensive metabolic panel     Status: Abnormal   Collection Time: 03/06/21 12:39 AM  Result Value Ref Range   Sodium 132 (L) 135 - 145 mmol/L   Potassium 4.1 3.5 - 5.1 mmol/L   Chloride 95 (L) 98 - 111 mmol/L   CO2 25 22 - 32 mmol/L   Glucose, Bld 499 (H) 70 - 99 mg/dL    Comment: Glucose reference range applies only to samples taken after fasting for at least 8 hours.   BUN 31 (H) 8 - 23 mg/dL   Creatinine, Ser 1.91 (H) 0.61 - 1.24 mg/dL   Calcium 8.9 8.9 - 10.3 mg/dL   Total Protein 6.7 6.5 - 8.1 g/dL   Albumin 3.5 3.5 - 5.0 g/dL   AST 22 15 - 41 U/L   ALT 19 0 - 44 U/L   Alkaline Phosphatase 104 38 - 126 U/L   Total Bilirubin 1.0 0.3 - 1.2 mg/dL   GFR, Estimated 34 (L) >60 mL/min    Comment:  (NOTE) Calculated using the CKD-EPI Creatinine Equation (2021)    Anion gap 12 5 - 15    Comment: Performed at [facility], [address] [place], [place] [address]  Lactic acid, plasma     Status: Abnormal   Collection Time: 03/06/21 12:39 AM  Result Value Ref Range   Lactic Acid, Venous 2.0 (HH) 0.5 - 1.9 mmol/L    Comment: CRITICAL RESULT CALLED TO, READ BACK BY AND VERIFIED WITH CAITLIN WATLINGTON @0111  ON 03/06/21 SKL Performed at [facility], [place], [place] [address]   Troponin I (High Sensitivity)     Status: Abnormal   Collection Time: 03/06/21 12:39 AM  Result Value Ref Range   Troponin I (High Sensitivity) 41 (H) <18 ng/L    Comment: (NOTE) Elevated high sensitivity troponin I (hsTnI) values and significant  changes across serial measurements may suggest ACS but many other  chronic and acute conditions are known to  elevate hsTnI results.  Refer to the "Links" section for chest pain algorithms and additional  guidance. Performed at [facility], [address] [place], [place] [address]   Hemoglobin A1c     Status: Abnormal   Collection Time: 03/06/21 12:39 AM  Result Value Ref Range   Hgb A1c MFr Bld 12.4 (H) 4.8 - 5.6 %    Comment: (NOTE)         Prediabetes: 5.7 - 6.4         Diabetes: >6.4         Glycemic control for adults with diabetes: <7.0    Mean Plasma Glucose 309 mg/dL    Comment: (NOTE) Performed At: [facility] [place], [place] [id number] Rush Farmer MD YH:[id number]   Urinalysis, Complete w Microscopic     Status: Abnormal   Collection Time: 03/06/21 12:55 AM  Result Value Ref Range   Color, Urine YELLOW (A) YELLOW   APPearance HAZY (A) CLEAR   Specific Gravity, Urine 1.026 1.005 - 1.030   pH 5.0 5.0 - 8.0   Glucose, UA >=500 (A) NEGATIVE mg/dL   Hgb urine dipstick NEGATIVE NEGATIVE   Bilirubin Urine NEGATIVE NEGATIVE   Ketones, ur 5 (A) NEGATIVE mg/dL   Protein,  ur 30 (A) NEGATIVE mg/dL   Nitrite NEGATIVE NEGATIVE   Leukocytes,Ua NEGATIVE NEGATIVE   RBC / HPF 11-20 0 - 5 RBC/hpf   WBC, UA 0-5 0 - 5 WBC/hpf   Bacteria, UA NONE SEEN NONE SEEN   Squamous Epithelial / LPF NONE SEEN 0 - 5   Mucus PRESENT    Hyaline Casts, UA PRESENT     Comment: Performed at [facility], [address], [place], [place] [address]  Resp Panel by RT-PCR (Flu A&B, Covid) Nasopharyngeal Swab     Status: None   Collection Time: 03/06/21 12:55 AM   Specimen: Nasopharyngeal Swab; Nasopharyngeal(NP) swabs in vial transport medium  Result Value Ref Range   SARS Coronavirus 2 by RT PCR NEGATIVE NEGATIVE    Comment: (NOTE) SARS-CoV-2 target nucleic acids are NOT DETECTED.  The SARS-CoV-2 RNA is generally detectable in upper respiratory specimens during the acute phase of infection. The lowest concentration of SARS-CoV-2 viral copies this assay can detect is 138 copies/mL. A negative result does not preclude SARS-Cov-2 infection and should not be used as the sole basis for treatment or other patient management decisions. A negative result may occur with  improper specimen collection/handling, submission of specimen other than nasopharyngeal swab, presence of viral mutation(s) within the areas targeted by this assay, and inadequate number of viral copies(<138 copies/mL). A negative result must be combined with clinical observations, patient history, and epidemiological information. The expected result is Negative.  Fact Sheet for Patients:  EntrepreneurPulse.com.au  Fact Sheet for Healthcare Providers:  IncredibleEmployment.be  This test is no t yet approved or cleared by the [place] FDA and  has been authorized for detection and/or diagnosis of SARS-CoV-2 by FDA under an Emergency Use Authorization (EUA). This EUA will remain  in effect (meaning this test can be used) for the duration of the COVID-19 declaration  under Section 564(b)(1) of the Act, 21 U.S.C.section 360bbb-3(b)(1), unless the authorization is terminated  or revoked sooner.       Influenza A by PCR NEGATIVE NEGATIVE   Influenza B by PCR NEGATIVE NEGATIVE    Comment: (NOTE) The Xpert Xpress SARS-CoV-2/FLU/RSV plus assay is intended as an aid in the diagnosis of influenza from  Nasopharyngeal swab specimens and should not be used as a sole basis for treatment. Nasal washings and aspirates are unacceptable for Xpert Xpress SARS-CoV-2/FLU/RSV testing.  Fact Sheet for Patients: EntrepreneurPulse.com.au  Fact Sheet for Healthcare Providers: IncredibleEmployment.be  This test is not yet approved or cleared by the [place] FDA and has been authorized for detection and/or diagnosis of SARS-CoV-2 by FDA under an Emergency Use Authorization (EUA). This EUA will remain in effect (meaning this test can be used) for the duration of the COVID-19 declaration under Section 564(b)(1) of the Act, 21 U.S.C. section 360bbb-3(b)(1), unless the authorization is terminated or revoked.  Performed at [facility], [address] [place], [place] [address]   Culture, blood (routine x 2)     Status: None   Collection Time: 03/06/21  1:54 AM   Specimen: BLOOD  Result Value Ref Range   Specimen Description BLOOD LEFT HAND    Special Requests      BOTTLES DRAWN AEROBIC AND ANAEROBIC Blood Culture adequate volume   Culture      NO GROWTH 5 DAYS Performed at [facility], [address] [place], [place] [address]    Report Status 03/11/2021 FINAL   Culture, blood (routine x 2)     Status: None   Collection Time: 03/06/21  1:56 AM   Specimen: BLOOD  Result Value Ref Range   Specimen Description BLOOD RIGHT HAND    Special Requests      BOTTLES DRAWN AEROBIC AND ANAEROBIC Blood Culture adequate volume   Culture      NO GROWTH 5 DAYS Performed at [facility], [address], [place], [place] [address]    Report Status 03/11/2021 FINAL   Lactic acid, plasma     Status: None   Collection Time: 03/06/21  2:30 AM  Result Value Ref Range   Lactic Acid, Venous 1.6 0.5 - 1.9 mmol/L    Comment: Performed at [facility], [place], [place] [address]  Troponin I (High Sensitivity)     Status: Abnormal   Collection Time: 03/06/21  2:30 AM  Result Value Ref Range   Troponin I (High Sensitivity) 39 (H) <18 ng/L    Comment: (NOTE) Elevated high sensitivity troponin I (hsTnI) values and significant  changes across serial measurements may suggest ACS but many other  chronic and acute conditions are known to elevate hsTnI results.  Refer to the "Links" section for chest pain algorithms and additional  guidance. Performed at [facility], [address] [place], [place] [address]   Valproic acid level     Status: Abnormal   Collection Time: 03/06/21  8:36 AM  Result Value Ref Range   Valproic Acid Lvl 33 (L) 50.0 - 100.0 ug/mL    Comment: Performed at [facility], [address], [place], [place] [address]  Basic metabolic panel     Status: Abnormal   Collection Time: 03/06/21  8:36 AM  Result Value Ref Range   Sodium 134 (L) 135 - 145 mmol/L   Potassium 3.7 3.5 - 5.1 mmol/L   Chloride 97 (L) 98 - 111 mmol/L   CO2 24 22 - 32 mmol/L   Glucose, Bld 419 (H) 70 - 99 mg/dL    Comment: Glucose reference range applies only to samples taken after fasting for at least 8 hours.   BUN 25 (H) 8 - 23 mg/dL   Creatinine, Ser 1.45 (H) 0.61 - 1.24 mg/dL   Calcium 8.6 (  L) 8.9 - 10.3 mg/dL   GFR, Estimated 48 (L) >60 mL/min    Comment: (NOTE) Calculated using the CKD-EPI Creatinine Equation (2021)    Anion gap 13 5 - 15    Comment: Performed at [facility], [address] [place], [place] [address]  Procalcitonin - Baseline     Status: None   Collection Time: 03/06/21  8:36 AM  Result Value Ref Range    Procalcitonin <0.10 ng/mL    Comment:        Interpretation: PCT (Procalcitonin) <= 0.5 ng/mL: Systemic infection (sepsis) is not likely. Local bacterial infection is possible. (NOTE)       Sepsis PCT Algorithm           Lower Respiratory Tract                                      Infection PCT Algorithm    ----------------------------     ----------------------------         PCT < 0.25 ng/mL                PCT < 0.10 ng/mL          Strongly encourage             Strongly discourage   discontinuation of antibiotics    initiation of antibiotics    ----------------------------     -----------------------------       PCT 0.25 - 0.50 ng/mL            PCT 0.10 - 0.25 ng/mL               OR       >80% decrease in PCT            Discourage initiation of                                            antibiotics      Encourage discontinuation           of antibiotics    ----------------------------     -----------------------------         PCT >= 0.50 ng/mL              PCT 0.26 - 0.50 ng/mL               AND        <80% decrease in PCT             Encourage initiation of                                             antibiotics       Encourage continuation           of antibiotics    ----------------------------     -----------------------------        PCT >= 0.50 ng/mL                  PCT > 0.50 ng/mL               AND         increase in PCT  Strongly encourage                                      initiation of antibiotics    Strongly encourage escalation           of antibiotics                                     -----------------------------                                           PCT <= 0.25 ng/mL                                                 OR                                        > 80% decrease in PCT                                      Discontinue / Do not initiate                                             antibiotics  Performed at [facility], [address] [place], [place] [address]   CBG monitoring, ED     Status: Abnormal   Collection Time: 03/06/21  8:39 AM  Result Value Ref Range   Glucose-Capillary 399 (H) 70 - 99 mg/dL    Comment: Glucose reference range applies only to samples taken after fasting for at least 8 hours.  CBG monitoring, ED     Status: Abnormal   Collection Time: 03/06/21 11:19 AM  Result Value Ref Range   Glucose-Capillary 375 (H) 70 - 99 mg/dL    Comment: Glucose reference range applies only to samples taken after fasting for at least 8 hours.  CBG monitoring, ED     Status: Abnormal   Collection Time: 03/06/21  5:09 PM  Result Value Ref Range   Glucose-Capillary 139 (H) 70 - 99 mg/dL    Comment: Glucose reference range applies only to samples taken after fasting for at least 8 hours.  Magnesium     Status: Abnormal   Collection Time: 03/06/21  8:56 PM  Result Value Ref Range   Magnesium 1.5 (L) 1.7 - 2.4 mg/dL    Comment: Performed at [facility], [address] [place], [place] [address]  Basic metabolic panel     Status: Abnormal   Collection Time: 03/06/21  8:56 PM  Result Value Ref Range   Sodium 138 135 - 145 mmol/L   Potassium 2.9 (L) 3.5 - 5.1 mmol/L   Chloride 99 98 - 111 mmol/L   CO2 28 22 - 32 mmol/L   Glucose, Bld 85 70 - 99  mg/dL    Comment: Glucose reference range applies only to samples taken after fasting for at least 8 hours.   BUN 24 (H) 8 - 23 mg/dL   Creatinine, Ser 1.48 (H) 0.61 - 1.24 mg/dL   Calcium 9.0 8.9 - 10.3 mg/dL   GFR, Estimated 47 (L) >60 mL/min    Comment: (NOTE) Calculated using the CKD-EPI Creatinine Equation (2021)    Anion gap 11 5 - 15    Comment: Performed at [facility], [address] [place], [place] [address]  Brain natriuretic peptide     Status: Abnormal   Collection Time: 03/06/21  8:58 PM  Result Value Ref Range   B Natriuretic Peptide 962.3 (H) 0.0 - 100.0 pg/mL    Comment: Performed at [facility],  [address] [place], [place] [address]  Glucose, capillary     Status: Abnormal   Collection Time: 03/07/21  3:15 AM  Result Value Ref Range   Glucose-Capillary 122 (H) 70 - 99 mg/dL    Comment: Glucose reference range applies only to samples taken after fasting for at least 8 hours.  Basic metabolic panel     Status: Abnormal   Collection Time: 03/07/21  6:04 AM  Result Value Ref Range   Sodium 138 135 - 145 mmol/L   Potassium 4.0 3.5 - 5.1 mmol/L   Chloride 101 98 - 111 mmol/L   CO2 25 22 - 32 mmol/L   Glucose, Bld 149 (H) 70 - 99 mg/dL    Comment: Glucose reference range applies only to samples taken after fasting for at least 8 hours.   BUN 21 8 - 23 mg/dL   Creatinine, Ser 1.49 (H) 0.61 - 1.24 mg/dL   Calcium 8.9 8.9 - 10.3 mg/dL   GFR, Estimated 46 (L) >60 mL/min    Comment: (NOTE) Calculated using the CKD-EPI Creatinine Equation (2021)    Anion gap 12 5 - 15    Comment: Performed at [facility] [facility], [address] [place], [place] [address]  Vitamin B12     Status: None   Collection Time: 03/07/21  6:04 AM  Result Value Ref Range   Vitamin B-12 808 180 - 914 pg/mL    Comment: (NOTE) This assay is not validated for testing neonatal or myeloproliferative syndrome specimens for Vitamin B12 levels. Performed at [facility], [place] [address] [place], [place] [address]   Phosphorus     Status: Abnormal   Collection Time: 03/07/21  6:04 AM  Result Value Ref Range   Phosphorus 2.3 (L) 2.5 - 4.6 mg/dL    Comment: Performed at [facility], [place], [place] [address]  Glucose, capillary     Status: Abnormal   Collection Time: 03/07/21  8:04 AM  Result Value Ref Range   Glucose-Capillary 193 (H) 70 - 99 mg/dL    Comment: Glucose reference range applies only to samples taken after fasting for at least 8 hours.  Glucose, capillary     Status: Abnormal   Collection Time: 03/07/21 12:16 PM  Result Value Ref Range   Glucose-Capillary  212 (H) 70 - 99 mg/dL    Comment: Glucose reference range applies only to samples taken after fasting for at least 8 hours.  Glucose, capillary     Status: Abnormal   Collection Time: 03/07/21  4:08 PM  Result Value Ref Range   Glucose-Capillary 261 (H) 70 - 99 mg/dL    Comment: Glucose reference range applies only  to samples taken after fasting for at least 8 hours.  Glucose, capillary     Status: Abnormal   Collection Time: 03/07/21  8:47 PM  Result Value Ref Range   Glucose-Capillary 161 (H) 70 - 99 mg/dL    Comment: Glucose reference range applies only to samples taken after fasting for at least 8 hours.  Basic metabolic panel     Status: Abnormal   Collection Time: 03/08/21  6:13 AM  Result Value Ref Range   Sodium 140 135 - 145 mmol/L   Potassium 3.3 (L) 3.5 - 5.1 mmol/L   Chloride 100 98 - 111 mmol/L   CO2 27 22 - 32 mmol/L   Glucose, Bld 55 (L) 70 - 99 mg/dL    Comment: Glucose reference range applies only to samples taken after fasting for at least 8 hours.   BUN 27 (H) 8 - 23 mg/dL   Creatinine, Ser 1.30 (H) 0.61 - 1.24 mg/dL   Calcium 8.9 8.9 - 10.3 mg/dL   GFR, Estimated 55 (L) >60 mL/min    Comment: (NOTE) Calculated using the CKD-EPI Creatinine Equation (2021)    Anion gap 13 5 - 15    Comment: Performed at [facility], [address] [place], [place] [address]  Magnesium     Status: None   Collection Time: 03/08/21  6:13 AM  Result Value Ref Range   Magnesium 2.0 1.7 - 2.4 mg/dL    Comment: Performed at [facility], [address] [place], [place] [address]  Glucose, capillary     Status: Abnormal   Collection Time: 03/08/21  7:52 AM  Result Value Ref Range   Glucose-Capillary 66 (L) 70 - 99 mg/dL    Comment: Glucose reference range applies only to samples taken after fasting for at least 8 hours.  Glucose, capillary     Status: Abnormal   Collection Time: 03/08/21 11:28 AM  Result Value Ref Range   Glucose-Capillary 153 (H) 70 - 99  mg/dL    Comment: Glucose reference range applies only to samples taken after fasting for at least 8 hours.  Glucose, capillary     Status: Abnormal   Collection Time: 03/08/21  4:51 PM  Result Value Ref Range   Glucose-Capillary 155 (H) 70 - 99 mg/dL    Comment: Glucose reference range applies only to samples taken after fasting for at least 8 hours.  Glucose, capillary     Status: Abnormal   Collection Time: 03/08/21  8:52 PM  Result Value Ref Range   Glucose-Capillary 211 (H) 70 - 99 mg/dL    Comment: Glucose reference range applies only to samples taken after fasting for at least 8 hours.  Basic metabolic panel     Status: Abnormal   Collection Time: 03/09/21  5:51 AM  Result Value Ref Range   Sodium 138 135 - 145 mmol/L   Potassium 4.7 3.5 - 5.1 mmol/L    Comment: HEMOLYSIS AT THIS LEVEL MAY AFFECT RESULT   Chloride 104 98 - 111 mmol/L   CO2 25 22 - 32 mmol/L   Glucose, Bld 81 70 - 99 mg/dL    Comment: Glucose reference range applies only to samples taken after fasting for at least 8 hours.   BUN 37 (H) 8 - 23 mg/dL   Creatinine, Ser 1.61 (H) 0.61 - 1.24 mg/dL   Calcium 8.9 8.9 - 10.3 mg/dL   GFR, Estimated 42 (L) >60 mL/min    Comment: (NOTE) Calculated using the CKD-EPI  Creatinine Equation (2021)    Anion gap 9 5 - 15    Comment: Performed at [facility], [address] [place], [place] [address]  Magnesium     Status: None   Collection Time: 03/09/21  5:51 AM  Result Value Ref Range   Magnesium 2.1 1.7 - 2.4 mg/dL    Comment: Performed at [facility], [address] [place], [place] [address]  Glucose, capillary     Status: None   Collection Time: 03/09/21  7:26 AM  Result Value Ref Range   Glucose-Capillary 78 70 - 99 mg/dL    Comment: Glucose reference range applies only to samples taken after fasting for at least 8 hours.  Glucose, capillary     Status: Abnormal   Collection Time: 03/09/21 12:39 PM  Result Value Ref Range    Glucose-Capillary 320 (H) 70 - 99 mg/dL    Comment: Glucose reference range applies only to samples taken after fasting for at least 8 hours.  Glucose, capillary     Status: Abnormal   Collection Time: 03/09/21  4:41 PM  Result Value Ref Range   Glucose-Capillary 298 (H) 70 - 99 mg/dL    Comment: Glucose reference range applies only to samples taken after fasting for at least 8 hours.  Glucose, capillary     Status: Abnormal   Collection Time: 03/09/21  8:51 PM  Result Value Ref Range   Glucose-Capillary 151 (H) 70 - 99 mg/dL    Comment: Glucose reference range applies only to samples taken after fasting for at least 8 hours.  Basic metabolic panel     Status: Abnormal   Collection Time: 03/10/21  4:32 AM  Result Value Ref Range   Sodium 135 135 - 145 mmol/L   Potassium 4.1 3.5 - 5.1 mmol/L   Chloride 104 98 - 111 mmol/L   CO2 27 22 - 32 mmol/L   Glucose, Bld 223 (H) 70 - 99 mg/dL    Comment: Glucose reference range applies only to samples taken after fasting for at least 8 hours.   BUN 38 (H) 8 - 23 mg/dL   Creatinine, Ser 1.39 (H) 0.61 - 1.24 mg/dL   Calcium 8.1 (L) 8.9 - 10.3 mg/dL   GFR, Estimated 50 (L) >60 mL/min    Comment: (NOTE) Calculated using the CKD-EPI Creatinine Equation (2021)    Anion gap 4 (L) 5 - 15    Comment: Performed at [facility], [address], [place], [place] [address]  Magnesium     Status: None   Collection Time: 03/10/21  4:32 AM  Result Value Ref Range   Magnesium 2.0 1.7 - 2.4 mg/dL    Comment: Performed at [facility], [address] [place],  [address]  Glucose, capillary     Status: Abnormal   Collection Time: 03/10/21  8:05 AM  Result Value Ref Range   Glucose-Capillary 181 (H) 70 - 99 mg/dL    Comment: Glucose reference range applies only to samples taken after fasting for at least 8 hours.  Glucose, capillary     Status: Abnormal   Collection Time: 03/10/21 12:03 PM  Result Value Ref Range    Glucose-Capillary 251 (H) 70 - 99 mg/dL    Comment: Glucose reference range applies only to samples taken after fasting for at least 8 hours.  Glucose, capillary     Status: Abnormal   Collection Time: 03/10/21  3:56 PM  Result Value Ref Range   Glucose-Capillary  230 (H) 70 - 99 mg/dL    Comment: Glucose reference range applies only to samples taken after fasting for at least 8 hours.  Glucose, capillary     Status: Abnormal   Collection Time: 03/10/21  8:28 PM  Result Value Ref Range   Glucose-Capillary 186 (H) 70 - 99 mg/dL    Comment: Glucose reference range applies only to samples taken after fasting for at least 8 hours.  Basic metabolic panel     Status: Abnormal   Collection Time: 03/11/21  6:16 AM  Result Value Ref Range   Sodium 137 135 - 145 mmol/L   Potassium 4.3 3.5 - 5.1 mmol/L   Chloride 104 98 - 111 mmol/L   CO2 26 22 - 32 mmol/L   Glucose, Bld 154 (H) 70 - 99 mg/dL    Comment: Glucose reference range applies only to samples taken after fasting for at least 8 hours.   BUN 38 (H) 8 - 23 mg/dL   Creatinine, Ser 1.39 (H) 0.61 - 1.24 mg/dL   Calcium 8.5 (L) 8.9 - 10.3 mg/dL   GFR, Estimated 50 (L) >60 mL/min    Comment: (NOTE) Calculated using the CKD-EPI Creatinine Equation (2021)    Anion gap 7 5 - 15    Comment: Performed at [facility], [address] [place], [place] [address]  Magnesium     Status: None   Collection Time: 03/11/21  6:16 AM  Result Value Ref Range   Magnesium 2.0 1.7 - 2.4 mg/dL    Comment: Performed at [facility], [address] [place], [place] [address]  Glucose, capillary     Status: Abnormal   Collection Time: 03/11/21  7:17 AM  Result Value Ref Range   Glucose-Capillary 151 (H) 70 - 99 mg/dL    Comment: Glucose reference range applies only to samples taken after fasting for at least 8 hours.  Resp Panel by RT-PCR (Flu A&B, Covid) Nasopharyngeal Swab     Status: None   Collection Time: 03/11/21 10:19 AM    Specimen: Nasopharyngeal Swab; Nasopharyngeal(NP) swabs in vial transport medium  Result Value Ref Range   SARS Coronavirus 2 by RT PCR NEGATIVE NEGATIVE    Comment: (NOTE) SARS-CoV-2 target nucleic acids are NOT DETECTED.  The SARS-CoV-2 RNA is generally detectable in upper respiratory specimens during the acute phase of infection. The lowest concentration of SARS-CoV-2 viral copies this assay can detect is 138 copies/mL. A negative result does not preclude SARS-Cov-2 infection and should not be used as the sole basis for treatment or other patient management decisions. A negative result may occur with  improper specimen collection/handling, submission of specimen other than nasopharyngeal swab, presence of viral mutation(s) within the areas targeted by this assay, and inadequate number of viral copies(<138 copies/mL). A negative result must be combined with clinical observations, patient history, and epidemiological information. The expected result is Negative.  Fact Sheet for Patients:  EntrepreneurPulse.com.au  Fact Sheet for Healthcare Providers:  IncredibleEmployment.be  This test is no t yet approved or cleared by the [place] FDA and  has been authorized for detection and/or diagnosis of SARS-CoV-2 by FDA under an Emergency Use Authorization (EUA). This EUA will remain  in effect (meaning this test can be used) for the duration of the COVID-19 declaration under Section 564(b)(1) of the Act, 21 U.S.C.section 360bbb-3(b)(1), unless the authorization is terminated  or revoked sooner.       Influenza A by PCR NEGATIVE NEGATIVE   Influenza  B by PCR NEGATIVE NEGATIVE    Comment: (NOTE) The Xpert Xpress SARS-CoV-2/FLU/RSV plus assay is intended as an aid in the diagnosis of influenza from Nasopharyngeal swab specimens and should not be used as a sole basis for treatment. Nasal washings and aspirates are unacceptable for Xpert Xpress  SARS-CoV-2/FLU/RSV testing.  Fact Sheet for Patients: EntrepreneurPulse.com.au  Fact Sheet for Healthcare Providers: IncredibleEmployment.be  This test is not yet approved or cleared by the [place] FDA and has been authorized for detection and/or diagnosis of SARS-CoV-2 by FDA under an Emergency Use Authorization (EUA). This EUA will remain in effect (meaning this test can be used) for the duration of the COVID-19 declaration under Section 564(b)(1) of the Act, 21 U.S.C. section 360bbb-3(b)(1), unless the authorization is terminated or revoked.  Performed at [facility], [address] [place], [place] [address]   Glucose, capillary     Status: Abnormal   Collection Time: 03/11/21 11:46 AM  Result Value Ref Range   Glucose-Capillary 267 (H) 70 - 99 mg/dL    Comment: Glucose reference range applies only to samples taken after fasting for at least 8 hours.  Comprehensive metabolic panel     Status: Abnormal   Collection Time: 04/17/21 10:04 AM  Result Value Ref Range   Sodium 134 (L) 135 - 145 mmol/L   Potassium 3.5 3.5 - 5.1 mmol/L   Chloride 99 98 - 111 mmol/L   CO2 28 22 - 32 mmol/L   Glucose, Bld 323 (H) 70 - 99 mg/dL    Comment: Glucose reference range applies only to samples taken after fasting for at least 8 hours.   BUN 30 (H) 8 - 23 mg/dL   Creatinine, Ser 1.31 (H) 0.61 - 1.24 mg/dL   Calcium 8.9 8.9 - 10.3 mg/dL   Total Protein 7.7 6.5 - 8.1 g/dL   Albumin 3.6 3.5 - 5.0 g/dL   AST 20 15 - 41 U/L   ALT 19 0 - 44 U/L   Alkaline Phosphatase 123 38 - 126 U/L   Total Bilirubin 0.5 0.3 - 1.2 mg/dL   GFR, Estimated 54 (L) >60 mL/min    Comment: (NOTE) Calculated using the CKD-EPI Creatinine Equation (2021)    Anion gap 7 5 - 15    Comment: Performed at [facility], [address] [place], [place] [address]  CBC     Status: None   Collection Time: 04/17/21 10:04 AM  Result Value Ref Range   WBC 9.3  4.0 - 10.5 K/uL   RBC 4.63 4.22 - 5.81 MIL/uL   Hemoglobin 13.4 13.0 - 17.0 g/dL   HCT 41.0 39.0 - 52.0 %   MCV 88.6 80.0 - 100.0 fL   MCH 28.9 26.0 - 34.0 pg   MCHC 32.7 30.0 - 36.0 g/dL   RDW 13.1 11.5 - 15.5 %   Platelets 232 150 - 400 K/uL   nRBC 0.0 0.0 - 0.2 %    Comment: Performed at [facility], [address] [place], [place] [address]  Urinalysis, Complete w Microscopic     Status: Abnormal   Collection Time: 04/17/21 10:04 AM  Result Value Ref Range   Color, Urine YELLOW YELLOW   APPearance CLEAR (A) CLEAR   Specific Gravity, Urine 1.020 1.005 - 1.030   pH 7.5 5.0 - 8.0   Glucose, UA >1,000 (A) NEGATIVE mg/dL   Hgb urine dipstick NEGATIVE NEGATIVE   Bilirubin Urine NEGATIVE NEGATIVE   Ketones, ur NEGATIVE NEGATIVE mg/dL  Protein, ur 100 (A) NEGATIVE mg/dL   Nitrite NEGATIVE NEGATIVE   Leukocytes,Ua NEGATIVE NEGATIVE   RBC / HPF 0-5 0 - 5 RBC/hpf   WBC, UA 0-5 0 - 5 WBC/hpf   Bacteria, UA NONE SEEN NONE SEEN   Squamous Epithelial / LPF 0-5 0 - 5    Comment: Performed at [facility], [place], [place] [address]  Troponin I (High Sensitivity)     Status: Abnormal   Collection Time: 04/17/21 10:04 AM  Result Value Ref Range   Troponin I (High Sensitivity) 44 (H) <18 ng/L    Comment: (NOTE) Elevated high sensitivity troponin I (hsTnI) values and significant  changes across serial measurements may suggest ACS but many other  chronic and acute conditions are known to elevate hsTnI results.  Refer to the "Links" section for chest pain algorithms and additional  guidance. Performed at [facility], [address] [place], [place] [address]   TSH     Status: None   Collection Time: 04/17/21 10:04 AM  Result Value Ref Range   TSH 2.413 0.350 - 4.500 uIU/mL    Comment: Performed by a 3rd Generation assay with a functional sensitivity of <=0.01 uIU/mL. Performed at [facility], [address] [place],  [place] [address]   Resp Panel by RT-PCR (Flu A&B, Covid) Nasopharyngeal Swab     Status: None   Collection Time: 04/17/21  1:08 PM   Specimen: Nasopharyngeal Swab; Nasopharyngeal(NP) swabs in vial transport medium  Result Value Ref Range   SARS Coronavirus 2 by RT PCR NEGATIVE NEGATIVE    Comment: (NOTE) SARS-CoV-2 target nucleic acids are NOT DETECTED.  The SARS-CoV-2 RNA is generally detectable in upper respiratory specimens during the acute phase of infection. The lowest concentration of SARS-CoV-2 viral copies this assay can detect is 138 copies/mL. A negative result does not preclude SARS-Cov-2 infection and should not be used as the sole basis for treatment or other patient management decisions. A negative result may occur with  improper specimen collection/handling, submission of specimen other than nasopharyngeal swab, presence of viral mutation(s) within the areas targeted by this assay, and inadequate number of viral copies(<138 copies/mL). A negative result must be combined with clinical observations, patient history, and epidemiological information. The expected result is Negative.  Fact Sheet for Patients:  EntrepreneurPulse.com.au  Fact Sheet for Healthcare Providers:  IncredibleEmployment.be  This test is no t yet approved or cleared by the [place] FDA and  has been authorized for detection and/or diagnosis of SARS-CoV-2 by FDA under an Emergency Use Authorization (EUA). This EUA will remain  in effect (meaning this test can be used) for the duration of the COVID-19 declaration under Section 564(b)(1) of the Act, 21 U.S.C.section 360bbb-3(b)(1), unless the authorization is terminated  or revoked sooner.       Influenza A by PCR NEGATIVE NEGATIVE   Influenza B by PCR NEGATIVE NEGATIVE    Comment: (NOTE) The Xpert Xpress SARS-CoV-2/FLU/RSV plus assay is intended as an aid in the diagnosis of influenza from Nasopharyngeal swab  specimens and should not be used as a sole basis for treatment. Nasal washings and aspirates are unacceptable for Xpert Xpress SARS-CoV-2/FLU/RSV testing.  Fact Sheet for Patients: EntrepreneurPulse.com.au  Fact Sheet for Healthcare Providers: IncredibleEmployment.be  This test is not yet approved or cleared by the [place] FDA and has been authorized for detection and/or diagnosis of SARS-CoV-2 by FDA under an Emergency Use Authorization (EUA). This EUA will remain in  effect (meaning this test can be used) for the duration of the COVID-19 declaration under Section 564(b)(1) of the Act, 21 U.S.C. section 360bbb-3(b)(1), unless the authorization is terminated or revoked.  Performed at [facility], [address] [place], [place] [address]   Ammonia     Status: None   Collection Time: 04/17/21  1:09 PM  Result Value Ref Range   Ammonia 21 9 - 35 umol/L    Comment: Performed at [facility], [address] [place], [place] [address]  Blood gas, venous     Status: Abnormal   Collection Time: 04/17/21  1:09 PM  Result Value Ref Range   pH, Ven 7.40 7.250 - 7.430   pCO2, Ven 49 44.0 - 60.0 mmHg   pO2, Ven <31.0 (LL) 32.0 - 45.0 mmHg   Bicarbonate 30.4 (H) 20.0 - 28.0 mmol/L   Acid-Base Excess 4.5 (H) 0.0 - 2.0 mmol/L   O2 Saturation 57.4 %   Patient temperature 37.0    Collection site VENOUS    Sample type VENOUS     Comment: Performed at [facility], [address] [place], [place] [address]  CBG monitoring, ED     Status: Abnormal   Collection Time: 04/17/21  3:38 PM  Result Value Ref Range   Glucose-Capillary 283 (H) 70 - 99 mg/dL    Comment: Glucose reference range applies only to samples taken after fasting for at least 8 hours.    Radiology No results found.  Assessment/Plan Essential hypertension blood pressure control important in reducing the progression of atherosclerotic disease. On  appropriate oral medications.     Type 2 diabetes mellitus treated with insulin (HCC) blood glucose control important in reducing the progression of atherosclerotic disease. Also, involved in wound healing. On appropriate medications.   Paroxysmal A-fib (HCC) On anticoagulation  S/P bilateral BKA (below knee amputation) (HCC) These are now poorly fitting and painful.  New Rx for bilateral BKA prosthesis given today.     Leotis Pain, MD  05/19/2021 12:28 PM    This note was created with Dragon medical transcription system.  Any errors from dictation are purely unintentional

## 2021-06-13 IMAGING — DX DG CHEST 1V PORT
1 series · 1 of 1 positions shown · non-contrast
Comparison: 06/25/2020 and earlier, including CT chest 11/30/2019.

CLINICAL DATA: 85-year-old with acute mental status changes and
open coil bore responsiveness". Current history of CHF,
hypertension.

EXAM:
PORTABLE CHEST 1 VIEW

[chest ap]
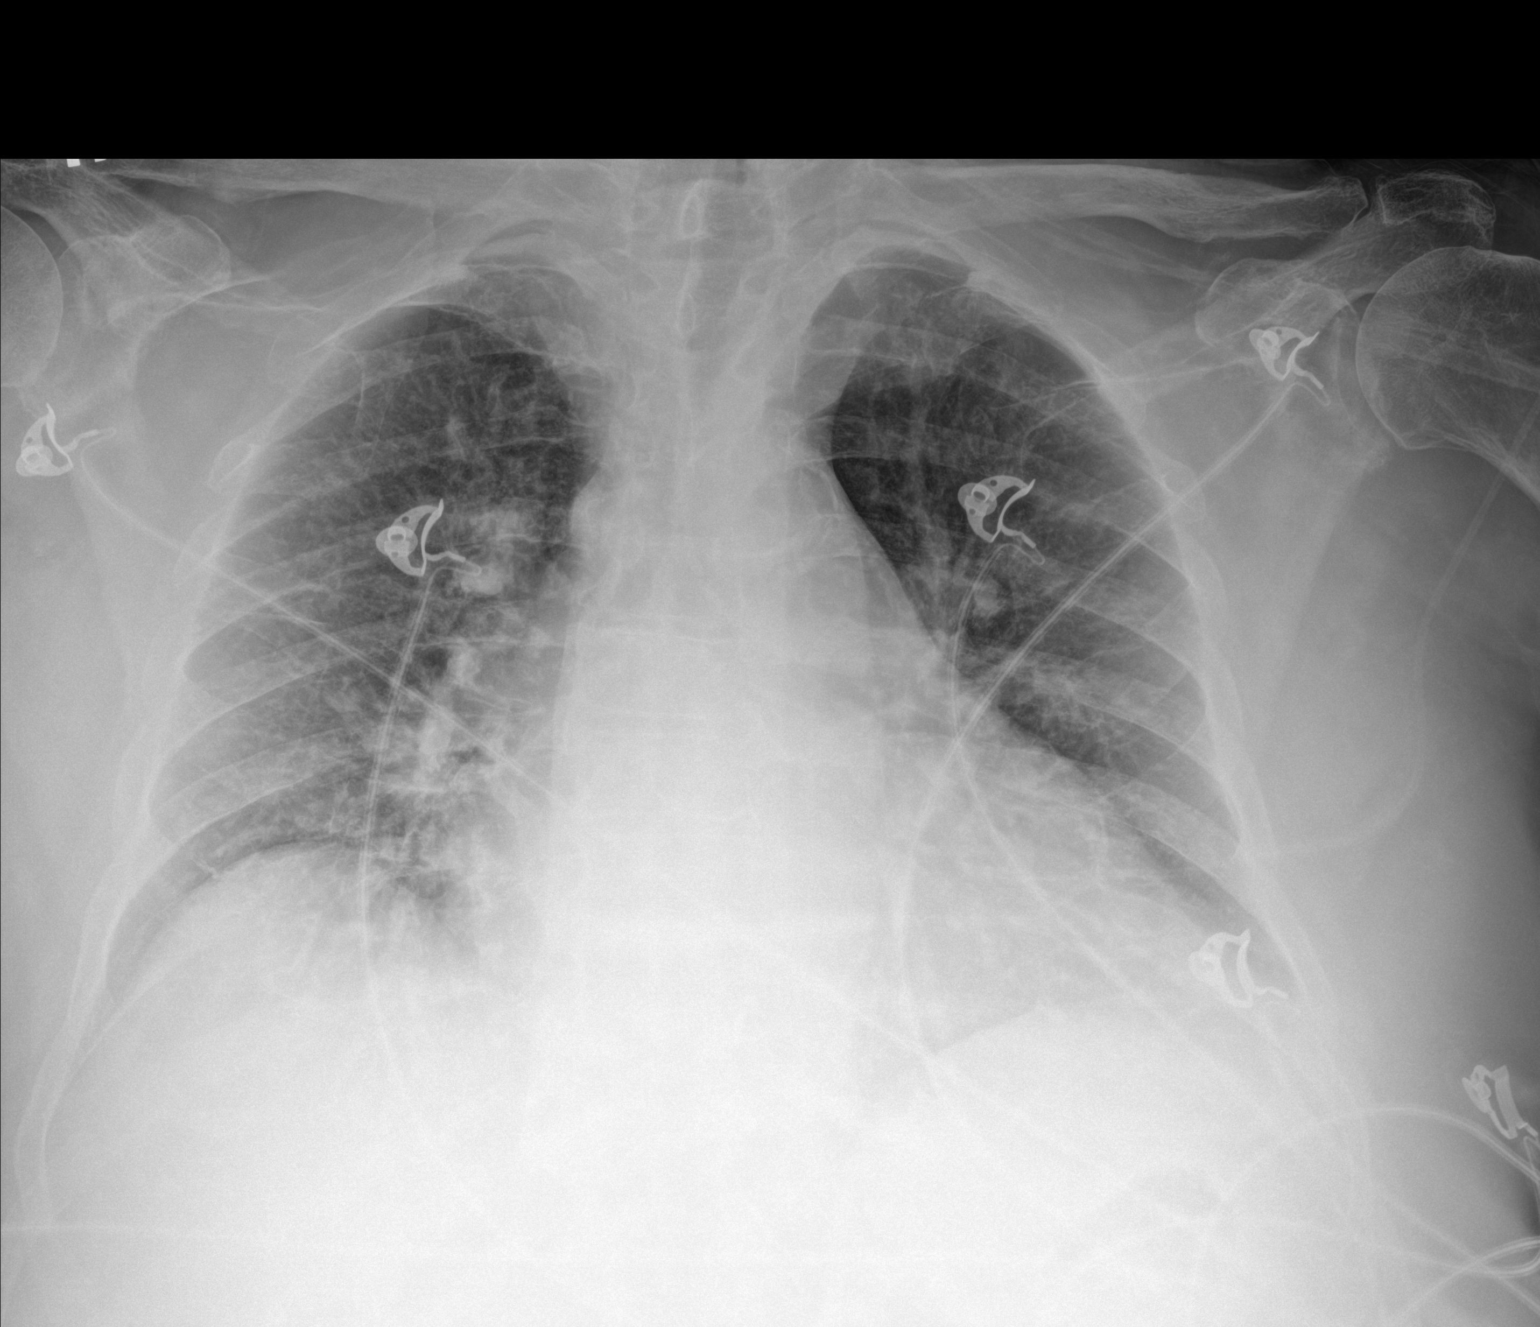

[1 of 1 positions shown; findings below may reference images not displayed]

FINDINGS: Cardiac silhouette moderately to markedly enlarged for AP portable
technique, unchanged. Pulmonary venous hypertension and likely mild
interstitial pulmonary edema. No confluent airspace consolidation.
No visible pleural effusions.
IMPRESSION: Mild CHF and/or fluid overload is suspected, with stable moderate to
marked cardiomegaly.

## 2021-06-19 ENCOUNTER — Other Ambulatory Visit: Payer: Self-pay

## 2021-06-19 ENCOUNTER — Emergency Department
Admission: EM | Admit: 2021-06-19 | Discharge: 2021-06-19 | Disposition: A | Discharge status: Home / Self Care | Payer: Medicare Other | Attending: Emergency Medicine | Admitting: Emergency Medicine

## 2021-06-19 ENCOUNTER — Encounter: Payer: Self-pay | Admitting: Emergency Medicine

## 2021-06-19 DIAGNOSIS — I130 Hypertensive heart and chronic kidney disease with heart failure and stage 1 through stage 4 chronic kidney disease, or unspecified chronic kidney disease: Secondary | ICD-10-CM | POA: Insufficient documentation

## 2021-06-19 DIAGNOSIS — I4891 Unspecified atrial fibrillation: Secondary | ICD-10-CM | POA: Diagnosis not present

## 2021-06-19 DIAGNOSIS — K5641 Fecal impaction: Secondary | ICD-10-CM | POA: Diagnosis not present

## 2021-06-19 DIAGNOSIS — K5900 Constipation, unspecified: Secondary | ICD-10-CM | POA: Diagnosis present

## 2021-06-19 DIAGNOSIS — I509 Heart failure, unspecified: Secondary | ICD-10-CM | POA: Insufficient documentation

## 2021-06-19 DIAGNOSIS — N189 Chronic kidney disease, unspecified: Secondary | ICD-10-CM | POA: Insufficient documentation

## 2021-06-19 DIAGNOSIS — E119 Type 2 diabetes mellitus without complications: Secondary | ICD-10-CM | POA: Diagnosis not present

## 2021-06-19 DIAGNOSIS — Z7901 Long term (current) use of anticoagulants: Secondary | ICD-10-CM | POA: Insufficient documentation

## 2021-06-19 DIAGNOSIS — I2510 Atherosclerotic heart disease of native coronary artery without angina pectoris: Secondary | ICD-10-CM | POA: Insufficient documentation

## 2021-06-19 LAB — CBC
HCT: 43.9 % (ref 39.0–52.0)
Hemoglobin: 14.2 g/dL (ref 13.0–17.0)
MCH: 29.8 pg (ref 26.0–34.0)
MCHC: 32.3 g/dL (ref 30.0–36.0)
MCV: 92 fL (ref 80.0–100.0)
Platelets: 241 10*3/uL (ref 150–400)
RBC: 4.77 MIL/uL (ref 4.22–5.81)
RDW: 14 % (ref 11.5–15.5)
WBC: 14.7 10*3/uL — ABNORMAL HIGH (ref 4.0–10.5)
nRBC: 0 % (ref 0.0–0.2)

## 2021-06-19 LAB — COMPREHENSIVE METABOLIC PANEL
ALT: 24 U/L (ref 0–44)
AST: 28 U/L (ref 15–41)
Albumin: 3.4 g/dL — ABNORMAL LOW (ref 3.5–5.0)
Alkaline Phosphatase: 87 U/L (ref 38–126)
Anion gap: 12 (ref 5–15)
BUN: 40 mg/dL — ABNORMAL HIGH (ref 8–23)
CO2: 24 mmol/L (ref 22–32)
Calcium: 9.4 mg/dL (ref 8.9–10.3)
Chloride: 102 mmol/L (ref 98–111)
Creatinine, Ser: 1.4 mg/dL — ABNORMAL HIGH (ref 0.61–1.24)
GFR, Estimated: 50 mL/min — ABNORMAL LOW (ref >60)
Glucose, Bld: 106 mg/dL — ABNORMAL HIGH (ref 70–99)
Potassium: 3.9 mmol/L (ref 3.5–5.1)
Sodium: 138 mmol/L (ref 135–145)
Total Bilirubin: 1 mg/dL (ref 0.3–1.2)
Total Protein: 6.9 g/dL (ref 6.5–8.1)

## 2021-06-19 LAB — LIPASE, BLOOD: Lipase: 30 U/L (ref 11–51)

## 2021-06-19 NOTE — ED Provider Notes (Signed)
[facility] Provider Note    Event Date/Time   First MD Initiated Contact with Patient 06/19/21 1710     (approximate)   History   Chief Complaint Constipation  HPI  [name] is a 87 y.o. male with past medical history of hypertension, PAD status post bilateral BKA, atrial fibrillation on Xarelto, CAD, CHF, diabetes, CKD, and seizures who presents to the ED complaining of constipation.  Patient reports that he has not had a bowel movement in about the past week.  He feels like there is something "stuck" in the area of his rectum that is blocking him from having a bowel movement.  He describes some pain in his rectal area but denies any abdominal pain.  He denies any nausea or vomiting and states he has been able to eat and drink normally.  In speaking with family over the phone, patient had been complaining of pain in his rectal area intermittently over the past couple of days, wife then decided to call EMS today.  They report that he has been at his baseline mental status.     Physical Exam   Triage Vital Signs: ED Triage Vitals  Enc Vitals Group     BP 06/19/21 1520 94/76     Pulse Rate 06/19/21 1520 67     Resp 06/19/21 1520 18     Temp 06/19/21 1520 98.1 F (36.7 C)     Temp Source 06/19/21 1520 Oral     SpO2 06/19/21 1520 99 %     Weight 06/19/21 1513 179 lb 14.3 oz (81.6 kg)     Height 06/19/21 1513 5\' 7"  (1.702 m)     Head Circumference --      Peak Flow --      Pain Score 06/19/21 1512 7     Pain Loc --      Pain Edu? --      Excl. in [place]? --     Most recent vital signs: Vitals:   06/19/21 1520  BP: 94/76  Pulse: 67  Resp: 18  Temp: 98.1 F (36.7 C)  SpO2: 99%    Constitutional: Alert and oriented. Eyes: Conjunctivae are normal. Head: Atraumatic. Nose: No congestion/rhinnorhea. Mouth/Throat: Mucous membranes are moist.  Cardiovascular: Normal rate, regular rhythm. Grossly normal heart sounds.  2+ radial pulses  bilaterally. Respiratory: Normal respiratory effort.  No retractions. Lungs CTAB. Gastrointestinal: Soft and nontender. No distention.  Significant volume of fecal incontinence with stool ball noted in rectum. Musculoskeletal: No lower extremity tenderness nor edema.  Neurologic:  Normal speech and language. No gross focal neurologic deficits are appreciated.    ED Results / Procedures / Treatments   Labs (all labs ordered are listed, but only abnormal results are displayed) Labs Reviewed  COMPREHENSIVE METABOLIC PANEL - Abnormal; Notable for the following components:      Result Value   Glucose, Bld 106 (*)    BUN 40 (*)    Creatinine, Ser 1.40 (*)    Albumin 3.4 (*)    GFR, Estimated 50 (*)    All other components within normal limits  CBC - Abnormal; Notable for the following components:   WBC 14.7 (*)    All other components within normal limits  LIPASE, BLOOD     PROCEDURES:  ------------------------------------------------------------------------------------------------------------------- Fecal Disimpaction Procedure Note:  Performed by me:  Patient placed in the lateral recumbent position with knees drawn towards chest. Nurse present for patient support. Large amount of hard brown stool  removed. No complications during procedure.   ------------------------------------------------------------------------------------------------------------------   Critical Care performed: No  Procedures   MEDICATIONS ORDERED IN ED: Medications - No data to display   IMPRESSION / MDM / [place] / ED COURSE  I reviewed the triage vital signs and the nursing notes.                              87 y.o. male with past medical history of hypertension, diabetes, PAD status post bilateral BKA, atrial fibrillation on Xarelto, CAD, CHF, CKD, and seizures who presents to the ED for constipation and sensation that something is stuck around his rectum for the past  week.  Differential diagnosis includes, but is not limited to, fecal impaction, constipation, bowel obstruction, dehydration, electrolyte abnormality.  Patient is nontoxic-appearing and in no acute distress, has no abdominal tenderness on exam and denies abdominal pain.  He does have significant fecal incontinence, appears to be related to overflow incontinence with an impacted stool ball.  Stool ball was manually disimpacted by myself with patient subsequently passing stool on his own and having improvement in symptoms.  Low suspicion for bowel obstruction at this time and given reassuring labs, we will hold off on imaging.  CBC and BMP show no anemia or electrolyte abnormality, LFTs and lipase are within normal limits.  Bowel regimen was discussed with family and patient is appropriate for outpatient follow-up.  Family counseled to have him follow-up with his PCP and to return to the ED for new worsening symptoms, patient and family agree with plan.       FINAL CLINICAL IMPRESSION(S) / ED DIAGNOSES   Final diagnoses:  Constipation, unspecified constipation type  Fecal impaction ([place])     Rx / DC Orders   ED Discharge Orders     None        Note:  This document was prepared using Dragon voice recognition software and may include unintentional dictation errors.   Blake Divine, MD 06/19/21 1843

## 2021-06-19 NOTE — ED Notes (Signed)
Provider at bedside to perform manual disimpaction.

## 2021-06-19 NOTE — ED Triage Notes (Signed)
Pt comes into the ED via [facility] from home c/o abd pain and possible constipation.  Last BM was 4 days ago.  Pt was able to pass some stool while in the ambulance.    93/66--> 120/64 98% RA 63 HR 98.1 oral

## 2021-06-19 NOTE — ED Notes (Signed)
Spoke with daughter and gave update.

## 2021-06-19 NOTE — ED Notes (Signed)
Informed daughter pt will need clean clothes.

## 2021-06-19 NOTE — Discharge Instructions (Signed)
You were seen in the emergency department today for constipation.  We recommend that you use one or more of the following over-the-counter medications in the order described:   1)  Colace (or Dulcolax) 100 mg:  This is a stool softener, and you may take it once or twice a day as needed. 2)  Senna tablets:  This is a bowel stimulant that will help "push" out your stool. It is the next step to add after you have tried a stool softener. 3)  Miralax (powder):  This medication works by drawing additional fluid into your intestines and helps to flush out your stool.  Mix the powder with water or juice according to label instructions.  It may help if the Colace and Senna are not sufficient, but you must be sure to use the recommended amount of water or juice when you mix up the powder. 4) You should take a daily fiber supplement such as Metamucil to help formed stools.  Remember that narcotic pain medications are constipating, so avoid them or minimize their use.  Drink plenty of fluids.  Please return to the Emergency Department immediately if you develop new or worsening symptoms that concern you, such as (but not limited to) fever > 101 degrees, severe abdominal pain, or persistent vomiting.

## 2021-06-19 NOTE — ED Notes (Signed)
Pt to ED for constipation. States he does not know when the last time he had BM was. States rectum is painful "like a ball there". Pt has bilateral BKA with prosthetics. Pt has strong odor of urine. Provided urinal for sample.

## 2021-08-31 DIAGNOSIS — Z7901 Long term (current) use of anticoagulants: Secondary | ICD-10-CM | POA: Diagnosis not present

## 2021-08-31 DIAGNOSIS — N189 Chronic kidney disease, unspecified: Secondary | ICD-10-CM | POA: Diagnosis not present

## 2021-08-31 DIAGNOSIS — E1122 Type 2 diabetes mellitus with diabetic chronic kidney disease: Secondary | ICD-10-CM | POA: Insufficient documentation

## 2021-08-31 DIAGNOSIS — K5641 Fecal impaction: Secondary | ICD-10-CM | POA: Insufficient documentation

## 2021-08-31 DIAGNOSIS — I509 Heart failure, unspecified: Secondary | ICD-10-CM | POA: Insufficient documentation

## 2021-08-31 DIAGNOSIS — I4891 Unspecified atrial fibrillation: Secondary | ICD-10-CM | POA: Insufficient documentation

## 2021-08-31 DIAGNOSIS — I130 Hypertensive heart and chronic kidney disease with heart failure and stage 1 through stage 4 chronic kidney disease, or unspecified chronic kidney disease: Secondary | ICD-10-CM | POA: Insufficient documentation

## 2021-08-31 DIAGNOSIS — I2510 Atherosclerotic heart disease of native coronary artery without angina pectoris: Secondary | ICD-10-CM | POA: Diagnosis not present

## 2021-08-31 DIAGNOSIS — K5900 Constipation, unspecified: Secondary | ICD-10-CM | POA: Diagnosis present

## 2021-08-31 LAB — COMPREHENSIVE METABOLIC PANEL
ALT: 26 U/L (ref 0–44)
AST: 29 U/L (ref 15–41)
Albumin: 3.6 g/dL (ref 3.5–5.0)
Alkaline Phosphatase: 101 U/L (ref 38–126)
Anion gap: 12 (ref 5–15)
BUN: 47 mg/dL — ABNORMAL HIGH (ref 8–23)
CO2: 22 mmol/L (ref 22–32)
Calcium: 9.4 mg/dL (ref 8.9–10.3)
Chloride: 103 mmol/L (ref 98–111)
Creatinine, Ser: 1.69 mg/dL — ABNORMAL HIGH (ref 0.61–1.24)
GFR, Estimated: 40 mL/min — ABNORMAL LOW (ref >60)
Glucose, Bld: 232 mg/dL — ABNORMAL HIGH (ref 70–99)
Potassium: 4.4 mmol/L (ref 3.5–5.1)
Sodium: 137 mmol/L (ref 135–145)
Total Bilirubin: 0.6 mg/dL (ref 0.3–1.2)
Total Protein: 7.5 g/dL (ref 6.5–8.1)

## 2021-08-31 LAB — CBC
HCT: 43.2 % (ref 39.0–52.0)
Hemoglobin: 14.2 g/dL (ref 13.0–17.0)
MCH: 29.2 pg (ref 26.0–34.0)
MCHC: 32.9 g/dL (ref 30.0–36.0)
MCV: 88.9 fL (ref 80.0–100.0)
Platelets: 245 10*3/uL (ref 150–400)
RBC: 4.86 MIL/uL (ref 4.22–5.81)
RDW: 12.7 % (ref 11.5–15.5)
WBC: 12.6 10*3/uL — ABNORMAL HIGH (ref 4.0–10.5)
nRBC: 0 % (ref 0.0–0.2)

## 2021-08-31 LAB — LIPASE, BLOOD: Lipase: 42 U/L (ref 11–51)

## 2021-08-31 NOTE — ED Triage Notes (Addendum)
Pt presents with  complaints of Constipation - he notes being seen here 2 weeks ago for the same thing and has not had BM since being discharged two weeks ago. Pt is poor historian. Denies N/V, CP or SOB.  ?

## 2021-09-01 ENCOUNTER — Emergency Department
Admission: EM | Admit: 2021-09-01 | Discharge: 2021-09-01 | Disposition: A | Discharge status: Home / Self Care | Payer: Medicare Other | Attending: Emergency Medicine | Admitting: Emergency Medicine

## 2021-09-01 DIAGNOSIS — K5641 Fecal impaction: Secondary | ICD-10-CM

## 2021-09-01 DIAGNOSIS — K5900 Constipation, unspecified: Secondary | ICD-10-CM

## 2021-09-01 MED ORDER — DOCUSATE SODIUM 100 MG PO CAPS: 100.0000 mg | ORAL_CAPSULE | Freq: Two times a day (BID) | ORAL | 2 refills | Status: AC

## 2021-09-01 NOTE — Discharge Instructions (Addendum)
You were seen in the emergency department today for constipation.  We recommend that you use one or more of the following over-the-counter medications in the order described:   1)  Colace (or Dulcolax) 100 mg:  This is a stool softener, and you may take it once or twice a day as needed. 2)  Senna tablets:  This is a bowel stimulant that will help "push" out your stool. It is the next step to add after you have tried a stool softener. 3)  Miralax (powder):  This medication works by drawing additional fluid into your intestines and helps to flush out your stool.  Mix the powder with water or juice according to label instructions.  It may help if the Colace and Senna are not sufficient, but you must be sure to use the recommended amount of water or juice when you mix up the powder. 4)  Look for magnesium citrate at the pharmacy (it is usually a small glass bottle).  Drink the bottle according to the label instructions.  Remember that narcotic pain medications are constipating, so avoid them or minimize their use.  Drink plenty of fluids.  Please return to the Emergency Department immediately if you develop new or worsening symptoms that concern you, such as (but not limited to) fever > 101 degrees, severe abdominal pain, or persistent vomiting.  

## 2021-09-01 NOTE — ED Notes (Signed)
Pt to ED for Constipation that started yesterday, was here two weeks ago for same thing. Denies any abdominal pain, N/V.  ?Denies OTC meds.  ? ?Pt is A&Ox4.  ?

## 2021-09-01 NOTE — ED Provider Notes (Signed)
? ?[facility] ?Provider Note ? ? ? Event Date/Time  ? First MD Initiated Contact with Patient 09/01/21 0002   ?  (approximate) ? ? ?History  ? ?Chief Complaint ?Constipation ? ? ?HPI ? ?[name] is a 87 y.o. male with past medical history of hypertension, PAD status post bilateral BKA, CAD, CHF, diabetes, atrial fibrillation on Xarelto, CKD, seizures, stroke, and memory loss who presents to the ED for constipation.  Patient reports that he has been feeling backed up and constipated for the past couple of weeks.  He has begun to feel like there is a "softball" stuck in his rectal area with increasing associated pain.  He denies any associated abdominal pain and has not had any nausea or vomiting.  He describes symptoms as similar to when he was seen in the ED in January for fecal impaction.  He is not sure whether he is taking any medication for constipation currently. ?  ? ? ?Physical Exam  ? ?Triage Vital Signs: ?ED Triage Vitals  ?Enc Vitals Group  ?   BP 08/31/21 2109 112/70  ?   Pulse Rate 08/31/21 2109 70  ?   Resp 08/31/21 2109 16  ?   Temp 08/31/21 2109 97.6 ?F (36.4 ?C)  ?   Temp src --   ?   SpO2 08/31/21 2109 95 %  ?   Weight 08/31/21 2116 179 lb 14.3 oz (81.6 kg)  ?   Height --   ?   Head Circumference --   ?   Peak Flow --   ?   Pain Score 08/31/21 2112 8  ?   Pain Loc --   ?   Pain Edu? --   ?   Excl. in [place]? --   ? ? ?Most recent vital signs: ?Vitals:  ? 08/31/21 2109  ?BP: 112/70  ?Pulse: 70  ?Resp: 16  ?Temp: 97.6 ?F (36.4 ?C)  ?SpO2: 95%  ? ? ?Constitutional: Alert and oriented. ?Eyes: Conjunctivae are normal. ?Head: Atraumatic. ?Nose: No congestion/rhinnorhea. ?Mouth/Throat: Mucous membranes are moist.  ?Cardiovascular: Normal rate, regular rhythm. Grossly normal heart sounds.  2+ radial pulses bilaterally. ?Respiratory: Normal respiratory effort.  No retractions. Lungs CTAB. ?Gastrointestinal: Soft and nontender. No distention.  Rectal exam with light brown stool  and stool ball in the rectum. ?Musculoskeletal: No lower extremity tenderness nor edema.  ?Neurologic:  Normal speech and language. No gross focal neurologic deficits are appreciated. ? ? ? ?ED Results / Procedures / Treatments  ? ?Labs ?(all labs ordered are listed, but only abnormal results are displayed) ?Labs Reviewed  ?COMPREHENSIVE METABOLIC PANEL - Abnormal; Notable for the following components:  ?    Result Value  ? Glucose, Bld 232 (*)   ? BUN 47 (*)   ? Creatinine, Ser 1.69 (*)   ? GFR, Estimated 40 (*)   ? All other components within normal limits  ?CBC - Abnormal; Notable for the following components:  ? WBC 12.6 (*)   ? All other components within normal limits  ?LIPASE, BLOOD  ?URINALYSIS, ROUTINE W REFLEX MICROSCOPIC  ? ? ? ?PROCEDURES: ? ?------------------------------------------------------------------------------------------------------------------- ?Fecal Disimpaction Procedure Note: ? ?Performed by me: ? ?Patient placed in the lateral recumbent position with knees drawn towards chest. Nurse present for patient support. Large amount of hard brown stool removed. No complications during procedure. ? ? ?------------------------------------------------------------------------------------------------------------------ ? ? ?Critical Care performed: No ? ?Procedures ? ? ?MEDICATIONS ORDERED IN ED: ?Medications - No data to display ? ? ?  IMPRESSION / MDM / ASSESSMENT AND PLAN / ED COURSE  ?I reviewed the triage vital signs and the nursing notes. ?             ?               ? ?87 y.o. male with past medical history of hypertension, diabetes, CAD, CHF, atrial fibrillation on Xarelto, PAD status post bilateral BKA, CKD, seizures, stroke, and memory loss who presents to the ED with constipation and what feels like a "softball" stuck in his rectal area. ? ?Differential diagnosis includes, but is not limited to, bowel obstruction, fecal impaction, constipation, dehydration, electrolyte abnormality. ? ?Patient  is nontoxic-appearing and in no acute distress, vital signs are unremarkable and he has a benign abdominal exam.  Rectal exam reveals rectal stool ball which was manually disimpacted with improvement in patient's symptoms.  Labs are reassuring, BMP shows mild AKI with no electrolyte abnormality, CBC with mild leukocytosis but no evidence of infectious process at this time, LFTs and lipase within normal limits.  I discussed bowel regimen with patient as well as patient's son over the phone, will prescribe Colace and recommended that he start on regular fiber supplementation, take MiraLAX as needed.  Patient counseled to follow-up with his PCP and to return to the ED for new or worsening symptoms, patient and son agree with plan. ? ?  ? ? ?FINAL CLINICAL IMPRESSION(S) / ED DIAGNOSES  ? ?Final diagnoses:  ?Constipation, unspecified constipation type  ?Fecal impaction ([place])  ? ? ? ?Rx / DC Orders  ? ?ED Discharge Orders   ? ?      Ordered  ?  docusate sodium (COLACE) 100 MG capsule  2 times daily       ? 09/01/21 0019  ? ?  ?  ? ?  ? ? ? ?Note:  This document was prepared using Dragon voice recognition software and may include unintentional dictation errors. ?  ?Blake Divine, MD ?09/01/21 [phone] ? ?

## 2021-10-29 IMAGING — DX DG CHEST 1V PORT
1 series · 1 of 1 positions shown · non-contrast
Comparison: 11/09/2020 and older exams.

CLINICAL DATA: Pt was out with family and started "feeling funny"
and experienced chest palpitations. Per EMS HR ranged between
120-160s. Hx of afib - states hes semi-compliant with his Cardizem
medication. Denies CP or SOB.

EXAM:
PORTABLE CHEST 1 VIEW

[chest ap]
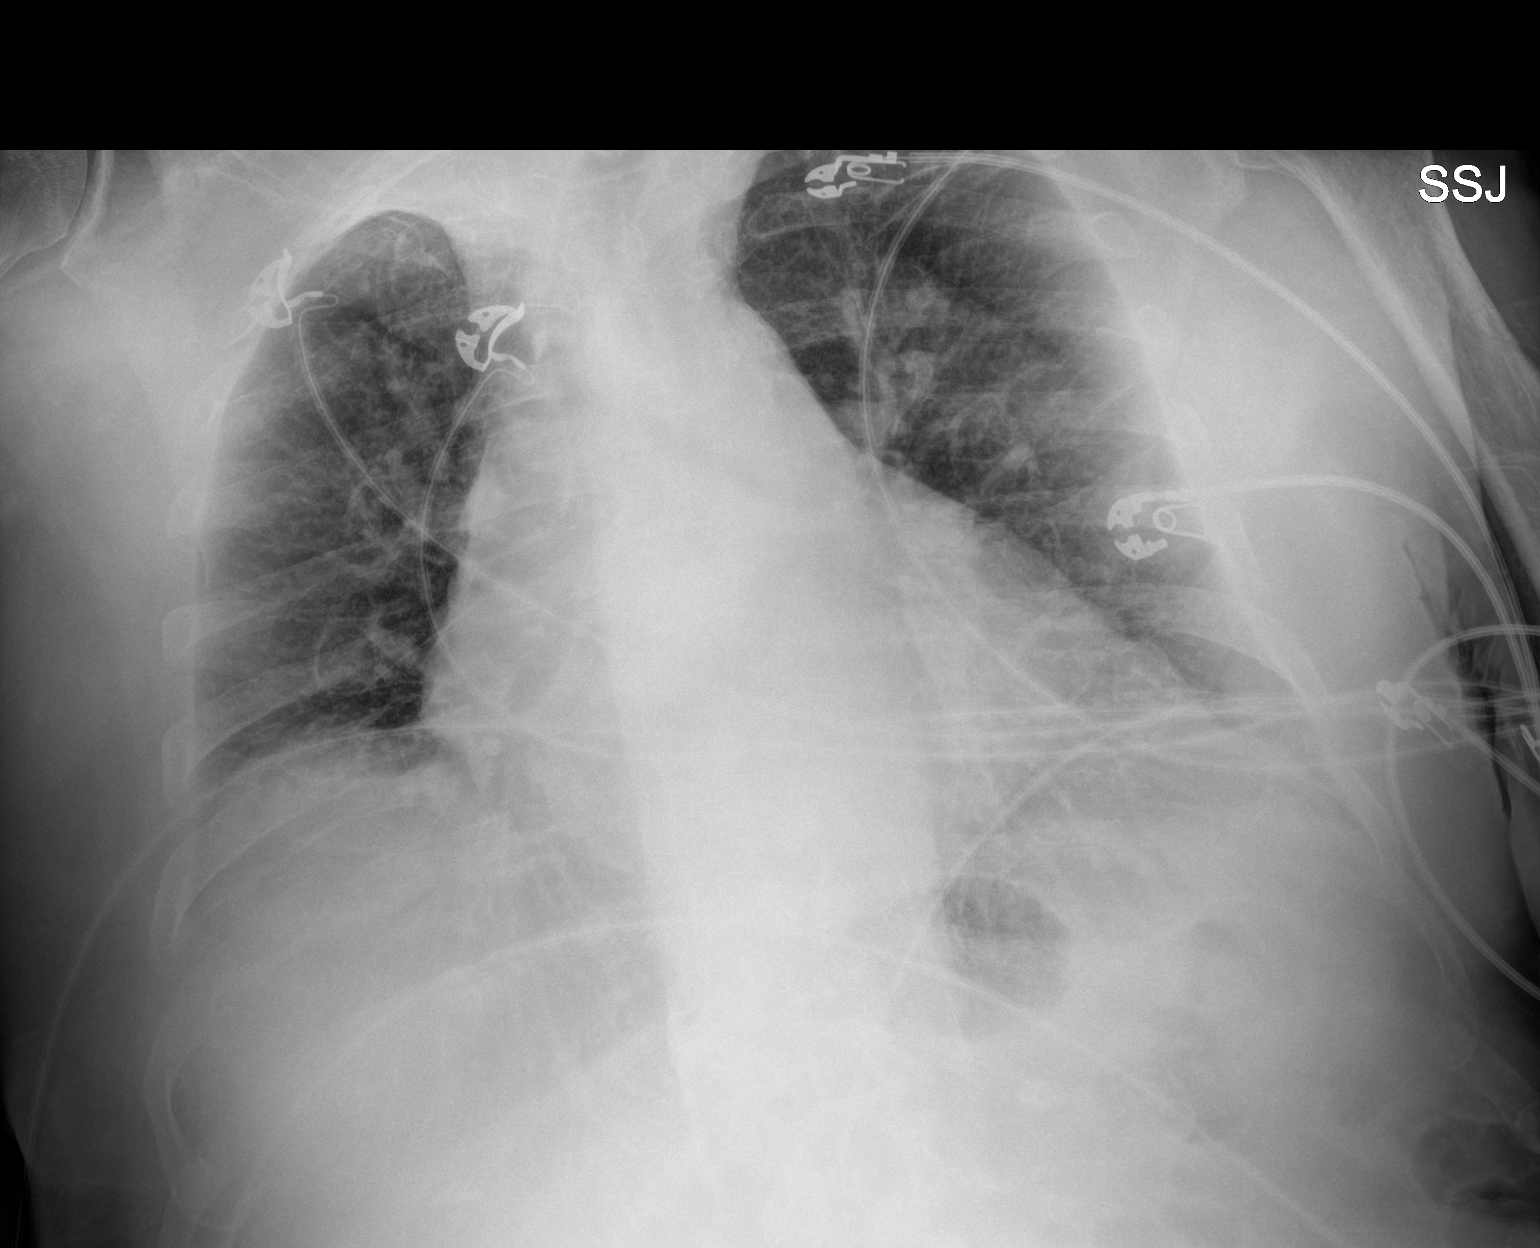

[1 of 1 positions shown; findings below may reference images not displayed]

FINDINGS: Cardiac silhouette is mildly enlarged. No mediastinal or hilar
masses.

Clear lungs.  No convincing pleural effusion.  No pneumothorax.

Skeletal structures are grossly intact.
IMPRESSION: No acute cardiopulmonary disease.

## 2022-02-16 ENCOUNTER — Ambulatory Visit (INDEPENDENT_AMBULATORY_CARE_PROVIDER_SITE_OTHER): Payer: Medicare Other | Admitting: Nurse Practitioner

## 2022-03-22 ENCOUNTER — Encounter (INDEPENDENT_AMBULATORY_CARE_PROVIDER_SITE_OTHER): Payer: Self-pay

## 2022-08-14 ENCOUNTER — Inpatient Hospital Stay
Admission: EM | Admit: 2022-08-14 | Discharge: 2022-08-17 | DRG: 291 | Disposition: A | Discharge status: Home Health | Payer: Medicare Other | Attending: Internal Medicine | Admitting: Internal Medicine

## 2022-08-14 ENCOUNTER — Emergency Department: Payer: Medicare Other

## 2022-08-14 ENCOUNTER — Encounter: Payer: Self-pay | Admitting: Internal Medicine

## 2022-08-14 ENCOUNTER — Other Ambulatory Visit: Payer: Self-pay

## 2022-08-14 DIAGNOSIS — E1151 Type 2 diabetes mellitus with diabetic peripheral angiopathy without gangrene: Secondary | ICD-10-CM | POA: Diagnosis present

## 2022-08-14 DIAGNOSIS — E1122 Type 2 diabetes mellitus with diabetic chronic kidney disease: Secondary | ICD-10-CM | POA: Diagnosis present

## 2022-08-14 DIAGNOSIS — Z7901 Long term (current) use of anticoagulants: Secondary | ICD-10-CM

## 2022-08-14 DIAGNOSIS — T502X5A Adverse effect of carbonic-anhydrase inhibitors, benzothiadiazides and other diuretics, initial encounter: Secondary | ICD-10-CM | POA: Diagnosis present

## 2022-08-14 DIAGNOSIS — I5023 Acute on chronic systolic (congestive) heart failure: Secondary | ICD-10-CM | POA: Diagnosis present

## 2022-08-14 DIAGNOSIS — I4819 Other persistent atrial fibrillation: Secondary | ICD-10-CM | POA: Diagnosis present

## 2022-08-14 DIAGNOSIS — F32A Depression, unspecified: Secondary | ICD-10-CM | POA: Diagnosis present

## 2022-08-14 DIAGNOSIS — I2510 Atherosclerotic heart disease of native coronary artery without angina pectoris: Secondary | ICD-10-CM | POA: Diagnosis present

## 2022-08-14 DIAGNOSIS — I5A Non-ischemic myocardial injury (non-traumatic): Secondary | ICD-10-CM | POA: Diagnosis present

## 2022-08-14 DIAGNOSIS — Z794 Long term (current) use of insulin: Secondary | ICD-10-CM

## 2022-08-14 DIAGNOSIS — R0902 Hypoxemia: Principal | ICD-10-CM

## 2022-08-14 DIAGNOSIS — J9601 Acute respiratory failure with hypoxia: Secondary | ICD-10-CM | POA: Diagnosis present

## 2022-08-14 DIAGNOSIS — I272 Pulmonary hypertension, unspecified: Secondary | ICD-10-CM | POA: Diagnosis present

## 2022-08-14 DIAGNOSIS — Z7982 Long term (current) use of aspirin: Secondary | ICD-10-CM

## 2022-08-14 DIAGNOSIS — Z8249 Family history of ischemic heart disease and other diseases of the circulatory system: Secondary | ICD-10-CM

## 2022-08-14 DIAGNOSIS — Z8546 Personal history of malignant neoplasm of prostate: Secondary | ICD-10-CM

## 2022-08-14 DIAGNOSIS — N179 Acute kidney failure, unspecified: Secondary | ICD-10-CM | POA: Diagnosis present

## 2022-08-14 DIAGNOSIS — Z66 Do not resuscitate: Secondary | ICD-10-CM | POA: Diagnosis present

## 2022-08-14 DIAGNOSIS — Z87891 Personal history of nicotine dependence: Secondary | ICD-10-CM

## 2022-08-14 DIAGNOSIS — E785 Hyperlipidemia, unspecified: Secondary | ICD-10-CM | POA: Diagnosis present

## 2022-08-14 DIAGNOSIS — Z1152 Encounter for screening for COVID-19: Secondary | ICD-10-CM | POA: Diagnosis not present

## 2022-08-14 DIAGNOSIS — I10 Essential (primary) hypertension: Secondary | ICD-10-CM | POA: Diagnosis present

## 2022-08-14 DIAGNOSIS — I4891 Unspecified atrial fibrillation: Secondary | ICD-10-CM | POA: Diagnosis not present

## 2022-08-14 DIAGNOSIS — Z89512 Acquired absence of left leg below knee: Secondary | ICD-10-CM

## 2022-08-14 DIAGNOSIS — I130 Hypertensive heart and chronic kidney disease with heart failure and stage 1 through stage 4 chronic kidney disease, or unspecified chronic kidney disease: Secondary | ICD-10-CM | POA: Diagnosis present

## 2022-08-14 DIAGNOSIS — Z833 Family history of diabetes mellitus: Secondary | ICD-10-CM

## 2022-08-14 DIAGNOSIS — Z79899 Other long term (current) drug therapy: Secondary | ICD-10-CM | POA: Diagnosis not present

## 2022-08-14 DIAGNOSIS — E039 Hypothyroidism, unspecified: Secondary | ICD-10-CM | POA: Diagnosis present

## 2022-08-14 DIAGNOSIS — I2489 Other forms of acute ischemic heart disease: Secondary | ICD-10-CM | POA: Diagnosis present

## 2022-08-14 DIAGNOSIS — D72829 Elevated white blood cell count, unspecified: Secondary | ICD-10-CM | POA: Diagnosis present

## 2022-08-14 DIAGNOSIS — Z89511 Acquired absence of right leg below knee: Secondary | ICD-10-CM | POA: Diagnosis not present

## 2022-08-14 DIAGNOSIS — N1831 Chronic kidney disease, stage 3a: Secondary | ICD-10-CM | POA: Diagnosis present

## 2022-08-14 DIAGNOSIS — I509 Heart failure, unspecified: Secondary | ICD-10-CM

## 2022-08-14 DIAGNOSIS — E1129 Type 2 diabetes mellitus with other diabetic kidney complication: Secondary | ICD-10-CM | POA: Diagnosis present

## 2022-08-14 DIAGNOSIS — G40909 Epilepsy, unspecified, not intractable, without status epilepticus: Secondary | ICD-10-CM | POA: Diagnosis present

## 2022-08-14 DIAGNOSIS — Z96641 Presence of right artificial hip joint: Secondary | ICD-10-CM | POA: Diagnosis present

## 2022-08-14 HISTORY — DX: Non-ischemic myocardial injury (non-traumatic): I5A

## 2022-08-14 LAB — URINALYSIS, COMPLETE (UACMP) WITH MICROSCOPIC
Bacteria, UA: NONE SEEN
Bilirubin Urine: NEGATIVE
Glucose, UA: NEGATIVE mg/dL
Hgb urine dipstick: NEGATIVE
Ketones, ur: NEGATIVE mg/dL
Leukocytes,Ua: NEGATIVE
Nitrite: NEGATIVE
Protein, ur: 100 mg/dL — AB
Specific Gravity, Urine: 1.008 (ref 1.005–1.030)
Squamous Epithelial / HPF: NONE SEEN /HPF (ref 0–5)
pH: 7 (ref 5.0–8.0)

## 2022-08-14 LAB — CBC WITH DIFFERENTIAL/PLATELET
Abs Immature Granulocytes: 0.05 10*3/uL (ref 0.00–0.07)
Basophils Absolute: 0.1 10*3/uL (ref 0.0–0.1)
Basophils Relative: 1 %
Eosinophils Absolute: 0.4 10*3/uL (ref 0.0–0.5)
Eosinophils Relative: 3 %
HCT: 43.9 % (ref 39.0–52.0)
Hemoglobin: 14.3 g/dL (ref 13.0–17.0)
Immature Granulocytes: 0 %
Lymphocytes Relative: 10 %
Lymphs Abs: 1.4 10*3/uL (ref 0.7–4.0)
MCH: 29.9 pg (ref 26.0–34.0)
MCHC: 32.6 g/dL (ref 30.0–36.0)
MCV: 91.8 fL (ref 80.0–100.0)
Monocytes Absolute: 0.8 10*3/uL (ref 0.1–1.0)
Monocytes Relative: 6 %
Neutro Abs: 10.6 10*3/uL — ABNORMAL HIGH (ref 1.7–7.7)
Neutrophils Relative %: 80 %
Platelets: 206 10*3/uL (ref 150–400)
RBC: 4.78 MIL/uL (ref 4.22–5.81)
RDW: 14.3 % (ref 11.5–15.5)
WBC: 13.4 10*3/uL — ABNORMAL HIGH (ref 4.0–10.5)
nRBC: 0 % (ref 0.0–0.2)

## 2022-08-14 LAB — COMPREHENSIVE METABOLIC PANEL
ALT: 50 U/L — ABNORMAL HIGH (ref 0–44)
AST: 35 U/L (ref 15–41)
Albumin: 3.3 g/dL — ABNORMAL LOW (ref 3.5–5.0)
Alkaline Phosphatase: 110 U/L (ref 38–126)
Anion gap: 10 (ref 5–15)
BUN: 46 mg/dL — ABNORMAL HIGH (ref 8–23)
CO2: 25 mmol/L (ref 22–32)
Calcium: 8.9 mg/dL (ref 8.9–10.3)
Chloride: 106 mmol/L (ref 98–111)
Creatinine, Ser: 1.78 mg/dL — ABNORMAL HIGH (ref 0.61–1.24)
GFR, Estimated: 37 mL/min — ABNORMAL LOW (ref >60)
Glucose, Bld: 126 mg/dL — ABNORMAL HIGH (ref 70–99)
Potassium: 4.8 mmol/L (ref 3.5–5.1)
Sodium: 141 mmol/L (ref 135–145)
Total Bilirubin: 0.7 mg/dL (ref 0.3–1.2)
Total Protein: 7 g/dL (ref 6.5–8.1)

## 2022-08-14 LAB — BRAIN NATRIURETIC PEPTIDE: B Natriuretic Peptide: 896.7 pg/mL — ABNORMAL HIGH (ref 0.0–100.0)

## 2022-08-14 LAB — CBG MONITORING, ED
Glucose-Capillary: 272 mg/dL — ABNORMAL HIGH (ref 70–99)
Glucose-Capillary: 112 mg/dL — ABNORMAL HIGH (ref 70–99)
Glucose-Capillary: 111 mg/dL — ABNORMAL HIGH (ref 70–99)

## 2022-08-14 LAB — MAGNESIUM: Magnesium: 2.2 mg/dL (ref 1.7–2.4)

## 2022-08-14 LAB — RESP PANEL BY RT-PCR (RSV, FLU A&B, COVID)  RVPGX2
Influenza A by PCR: NEGATIVE
Influenza B by PCR: NEGATIVE
Resp Syncytial Virus by PCR: NEGATIVE
SARS Coronavirus 2 by RT PCR: NEGATIVE

## 2022-08-14 LAB — TROPONIN I (HIGH SENSITIVITY)
Troponin I (High Sensitivity): 37 ng/L — ABNORMAL HIGH (ref <18)
Troponin I (High Sensitivity): 44 ng/L — ABNORMAL HIGH (ref <18)
Troponin I (High Sensitivity): 47 ng/L — ABNORMAL HIGH (ref <18)
Troponin I (High Sensitivity): 48 ng/L — ABNORMAL HIGH (ref <18)

## 2022-08-14 MED ORDER — METOPROLOL SUCCINATE ER 25 MG PO TB24
25.0000 mg | ORAL_TABLET | Freq: Every day | ORAL | Status: DC
  Administered 2022-08-14 – 2022-08-16 (×3): 25 mg via ORAL
  Filled 2022-08-14 (×3): qty 1

## 2022-08-14 MED ORDER — ALBUTEROL SULFATE (2.5 MG/3ML) 0.083% IN NEBU: 3.0000 mL | INHALATION_SOLUTION | RESPIRATORY_TRACT | Status: DC | PRN

## 2022-08-14 MED ORDER — ENSURE MAX PROTEIN PO LIQD
11.0000 [oz_av] | Freq: Two times a day (BID) | ORAL | Status: DC
  Administered 2022-08-15 – 2022-08-17 (×5): 11 [oz_av] via ORAL

## 2022-08-14 MED ORDER — DIVALPROEX SODIUM 500 MG PO DR TAB
500.0000 mg | DELAYED_RELEASE_TABLET | Freq: Every day | ORAL | Status: DC
  Administered 2022-08-14 – 2022-08-17 (×4): 500 mg via ORAL
  Filled 2022-08-14 (×4): qty 1

## 2022-08-14 MED ORDER — ACETAMINOPHEN 325 MG PO TABS: 650.0000 mg | ORAL_TABLET | Freq: Four times a day (QID) | ORAL | Status: DC | PRN

## 2022-08-14 MED ORDER — INSULIN ASPART 100 UNIT/ML IJ SOLN
0.0000 [IU] | Freq: Three times a day (TID) | INTRAMUSCULAR | Status: DC
  Administered 2022-08-14: 5 [IU] via SUBCUTANEOUS
  Administered 2022-08-15 (×2): 1 [IU] via SUBCUTANEOUS
  Administered 2022-08-15: 2 [IU] via SUBCUTANEOUS
  Administered 2022-08-16 (×2): 1 [IU] via SUBCUTANEOUS
  Administered 2022-08-16: 5 [IU] via SUBCUTANEOUS
  Administered 2022-08-17: 2 [IU] via SUBCUTANEOUS
  Filled 2022-08-14 (×8): qty 1

## 2022-08-14 MED ORDER — ONDANSETRON HCL 4 MG/2ML IJ SOLN: 4.0000 mg | Freq: Three times a day (TID) | INTRAMUSCULAR | Status: DC | PRN

## 2022-08-14 MED ORDER — INSULIN ASPART 100 UNIT/ML IJ SOLN
0.0000 [IU] | Freq: Every day | INTRAMUSCULAR | Status: DC
  Administered 2022-08-16: 2 [IU] via SUBCUTANEOUS
  Filled 2022-08-14: qty 1

## 2022-08-14 MED ORDER — FUROSEMIDE 10 MG/ML IJ SOLN
40.0000 mg | Freq: Two times a day (BID) | INTRAMUSCULAR | Status: DC
  Administered 2022-08-14 – 2022-08-16 (×4): 40 mg via INTRAVENOUS
  Filled 2022-08-14 (×4): qty 4

## 2022-08-14 MED ORDER — DILTIAZEM HCL ER COATED BEADS 120 MG PO CP24
120.0000 mg | ORAL_CAPSULE | Freq: Every day | ORAL | Status: DC
  Administered 2022-08-14 – 2022-08-17 (×4): 120 mg via ORAL
  Filled 2022-08-14 (×4): qty 1

## 2022-08-14 MED ORDER — RIVAROXABAN 15 MG PO TABS
15.0000 mg | ORAL_TABLET | Freq: Every day | ORAL | Status: DC
  Administered 2022-08-14 – 2022-08-16 (×3): 15 mg via ORAL
  Filled 2022-08-14 (×4): qty 1

## 2022-08-14 MED ORDER — NITROGLYCERIN 0.4 MG SL SUBL: 0.4000 mg | SUBLINGUAL_TABLET | SUBLINGUAL | Status: DC | PRN

## 2022-08-14 MED ORDER — DM-GUAIFENESIN ER 30-600 MG PO TB12: 1.0000 | ORAL_TABLET | Freq: Two times a day (BID) | ORAL | Status: DC | PRN

## 2022-08-14 MED ORDER — MEMANTINE HCL 10 MG PO TABS
10.0000 mg | ORAL_TABLET | Freq: Two times a day (BID) | ORAL | Status: DC
  Administered 2022-08-14 – 2022-08-17 (×6): 10 mg via ORAL
  Filled 2022-08-14 (×3): qty 1
  Filled 2022-08-14: qty 2
  Filled 2022-08-14 (×2): qty 1

## 2022-08-14 MED ORDER — ISOSORBIDE MONONITRATE ER 60 MG PO TB24
60.0000 mg | ORAL_TABLET | Freq: Every day | ORAL | Status: DC
  Administered 2022-08-14 – 2022-08-16 (×3): 60 mg via ORAL
  Filled 2022-08-14 (×3): qty 1

## 2022-08-14 MED ORDER — INSULIN GLARGINE-YFGN 100 UNIT/ML ~~LOC~~ SOLN
20.0000 [IU] | Freq: Every day | SUBCUTANEOUS | Status: DC
  Administered 2022-08-14 – 2022-08-17 (×4): 20 [IU] via SUBCUTANEOUS
  Filled 2022-08-14 (×4): qty 0.2

## 2022-08-14 MED ORDER — ADULT MULTIVITAMIN W/MINERALS CH
1.0000 | ORAL_TABLET | Freq: Every day | ORAL | Status: DC
  Administered 2022-08-14 – 2022-08-17 (×4): 1 via ORAL
  Filled 2022-08-14 (×4): qty 1

## 2022-08-14 MED ORDER — ASPIRIN 81 MG PO TBEC
81.0000 mg | DELAYED_RELEASE_TABLET | Freq: Every day | ORAL | Status: DC
  Administered 2022-08-15 – 2022-08-17 (×3): 81 mg via ORAL
  Filled 2022-08-14 (×3): qty 1

## 2022-08-14 MED ORDER — TRIMETHOPRIM 100 MG PO TABS
100.0000 mg | ORAL_TABLET | Freq: Every day | ORAL | Status: DC
  Administered 2022-08-14 – 2022-08-17 (×4): 100 mg via ORAL
  Filled 2022-08-14 (×4): qty 1

## 2022-08-14 MED ORDER — LORAZEPAM 2 MG/ML IJ SOLN: 1.0000 mg | INTRAMUSCULAR | Status: DC | PRN

## 2022-08-14 MED ORDER — DOCUSATE SODIUM 100 MG PO CAPS
100.0000 mg | ORAL_CAPSULE | Freq: Every day | ORAL | Status: DC
  Administered 2022-08-15 – 2022-08-17 (×3): 100 mg via ORAL
  Filled 2022-08-14 (×3): qty 1

## 2022-08-14 MED ORDER — HYDRALAZINE HCL 20 MG/ML IJ SOLN: 5.0000 mg | INTRAMUSCULAR | Status: DC | PRN

## 2022-08-14 MED ORDER — ATORVASTATIN CALCIUM 80 MG PO TABS
80.0000 mg | ORAL_TABLET | Freq: Every day | ORAL | Status: DC
  Administered 2022-08-14 – 2022-08-16 (×3): 80 mg via ORAL
  Filled 2022-08-14 (×2): qty 1
  Filled 2022-08-14: qty 4

## 2022-08-14 MED ORDER — FLUOXETINE HCL 20 MG PO CAPS
40.0000 mg | ORAL_CAPSULE | Freq: Every day | ORAL | Status: DC
  Administered 2022-08-14 – 2022-08-17 (×4): 40 mg via ORAL
  Filled 2022-08-14 (×4): qty 2

## 2022-08-14 MED ORDER — DILTIAZEM HCL-DEXTROSE 125-5 MG/125ML-% IV SOLN (PREMIX)
5.0000 mg/h | INTRAVENOUS | Status: DC
  Administered 2022-08-14 – 2022-08-15 (×2): 5 mg/h via INTRAVENOUS
  Filled 2022-08-14 (×2): qty 125

## 2022-08-14 MED ORDER — FUROSEMIDE 10 MG/ML IJ SOLN
60.0000 mg | Freq: Once | INTRAMUSCULAR | Status: AC
  Administered 2022-08-14: 60 mg via INTRAVENOUS
  Filled 2022-08-14: qty 8

## 2022-08-14 MED ORDER — DILTIAZEM HCL 25 MG/5ML IV SOLN
10.0000 mg | Freq: Once | INTRAVENOUS | Status: AC
  Administered 2022-08-14: 10 mg via INTRAVENOUS
  Filled 2022-08-14: qty 5

## 2022-08-14 NOTE — H&P (Signed)
History and Physical    Lj Sliman [id number] DOB: 1934-09-23 DOA: 08/14/2022  Referring MD/NP/PA:   PCP: Latanya Maudlin, NP   Patient coming from:  The patient is coming from home.    Chief Complaint: SOB  HPI: [name] is a 88 y.o. male with medical history significant of sCHF with EF 35-40%, HTN, HLD, CAD, A fib on Xarelto, hypothyroidism, CKD-3A, seizure, prostate cancer, PVD, s/p of bilateral BKA, who presents with shortness of breath.  Patient states his short of breath that started this morning, which has been progressively worsening.  Patient does not have chest pain or shortness breath.  He has palpitation and heart racing.  Normally he is not using oxygen, but pt was found to have oxygen desaturation to 83% on room air, use accessory muscle for breathing, cannot speak in full sentence.  Patient was initially started on 15 L nonrebreather oxygen, then improved to 2 L oxygen with 97% of saturation.  Patient does not have nausea, vomiting, diarrhea or abdominal pain.  No symptoms of UTI.  Per his son, patient is taking Xarelto consistently, last dose was yesterday morning.  Data reviewed independently and ED Course: pt was found to have BNP 896, WBC 13.4, worsening renal function, negative PCR for COVID, flu and RSV, temperature normal, blood pressure 135/96, atrial fibrillation with RVR with a heart rate 115, RR 26.  Chest x-ray showed chronic interstitial changes without infiltration.  Patient is admitted to telemetry bed as inpatient.   EKG: I have personally reviewed.  Atrial fibrillation with RVR, heart rate of 126, QTc 446, poor R wave progression,   Review of Systems:   General: no fevers, chills, no body weight gain, has fatigue HEENT: no blurry vision, hearing changes or sore throat Respiratory: has dyspnea, no coughing, no wheezing CV: no chest pain, has palpitations GI: no nausea, vomiting, abdominal pain, diarrhea, constipation GU: no  dysuria, burning on urination, increased urinary frequency, hematuria  Ext: s/p of bilateral BKA Neuro: no unilateral weakness, numbness, or tingling, no vision change or hearing loss Skin: no rash, no skin tear. MSK: No muscle spasm, no deformity, no limitation of range of movement in spin Heme: No easy bruising.  Travel history: No recent long distant travel.   Allergy:  Allergies  Allergen Reactions   Contrast Media [Iodinated Contrast Media] Shortness Of Breath    SOB   Iohexol Shortness Of Breath     Desc: Respiratory Distress, Laryngedema, diaphoresis, Onset Date: [id number]    Metrizamide Shortness Of Breath    SOB SOB SOB    Latex Rash    Past Medical History:  Diagnosis Date   Arthritis    Atrial fibrillation (HCC)    Carcinoma of prostate (HCC)    CHF (congestive heart failure) (HCC)    Chronic kidney disease    Diabetes mellitus without complication (HCC)    ED (erectile dysfunction)    Frequent urination    Hyperlipidemia    Hypertension    Moderate mitral insufficiency    Peripheral vascular disease ([place])    Pneumonia 04/2016   Prostate cancer ([place])    Sleep apnea    OSA--USE C-PAP   Ulcer of left foot due to type 2 diabetes mellitus (HCC)    Urinary stress incontinence, male     Past Surgical History:  Procedure Laterality Date   AMPUTATION Left 01/12/2017   Procedure: AMPUTATION BELOW KNEE;  Surgeon: Algernon Huxley, MD;  Location: [facility] ORS;  Service: General;  Laterality: Left;   AMPUTATION Right 12/27/2018   Procedure: AMPUTATION BELOW KNEE;  Surgeon: Algernon Huxley, MD;  Location: [facility];  Service: General;  Laterality: Right;   APLIGRAFT PLACEMENT Left 10/15/2016   Procedure: APLIGRAFT PLACEMENT;  Surgeon: Albertine Patricia, DPM;  Location: [facility];  Service: Podiatry;  Laterality: Left;  necrotic ulcer   CHOLECYSTECTOMY     COLONOSCOPY WITH PROPOFOL N/A 01/02/2019   Procedure: COLONOSCOPY WITH PROPOFOL;  Surgeon: Lucilla Lame, MD;  Location: [facility]  ENDOSCOPY;  Service: Endoscopy;  Laterality: N/A;   COLONOSCOPY WITH PROPOFOL N/A 01/05/2019   Procedure: COLONOSCOPY WITH PROPOFOL;  Surgeon: Lucilla Lame, MD;  Location: [facility] ENDOSCOPY;  Service: Endoscopy;  Laterality: N/A;   ESOPHAGOGASTRODUODENOSCOPY (EGD) WITH PROPOFOL N/A 01/02/2019   Procedure: ESOPHAGOGASTRODUODENOSCOPY (EGD) WITH PROPOFOL;  Surgeon: Lucilla Lame, MD;  Location: [facility] ENDOSCOPY;  Service: Endoscopy;  Laterality: N/A;   EYE SURGERY Bilateral    Cataract Extraction with IOL   HIP ARTHROPLASTY Right 10/13/2018   Procedure: ARTHROPLASTY BIPOLAR HIP (HEMIARTHROPLASTY);  Surgeon: Earnestine Leys, MD;  Location: [facility];  Service: Orthopedics;  Laterality: Right;   IRRIGATION AND DEBRIDEMENT FOOT Left 10/15/2016   Procedure: IRRIGATION AND DEBRIDEMENT FOOT-EXCISIONAL DEBRIDEMENT OF SKIN 3RD, 4TH AND 5TH TOES WITH APPLICATION OF APLIGRAFT;  Surgeon: Albertine Patricia, DPM;  Location: [facility];  Service: Podiatry;  Laterality: Left;  necrotic,gangrene   IRRIGATION AND DEBRIDEMENT FOOT Left 12/09/2016   Procedure: IRRIGATION AND DEBRIDEMENT FOOT-MUSCLE/FASCIA, RESECTION OF FOURTH AND FIFTH METATARSAL NECROTIC BONE;  Surgeon: Albertine Patricia, DPM;  Location: [facility];  Service: Podiatry;  Laterality: Left;   IRRIGATION AND DEBRIDEMENT FOOT Right 12/23/2018   Procedure: IRRIGATION AND DEBRIDEMENT FOOT and wound vac application;  Surgeon: Samara Deist, DPM;  Location: [facility];  Service: Podiatry;  Laterality: Right;   LEFT HEART CATH N/A 06/25/2019   Procedure: Left Heart Cath and Coronary Angiography;  Surgeon: Isaias Cowman, MD;  Location: [place] [facility];  Service: Cardiovascular;  Laterality: N/A;   LEFT HEART CATH AND CORONARY ANGIOGRAPHY N/A 06/05/2018   Procedure: LEFT HEART CATH AND CORONARY ANGIOGRAPHY;  Surgeon: Yolonda Kida, MD;  Location: [place] [facility];  Service: Cardiovascular;  Laterality: N/A;   LOWER EXTREMITY ANGIOGRAPHY Left 08/16/2016   Procedure:  Lower Extremity Angiography;  Surgeon: Algernon Huxley, MD;  Location: [place] [facility];  Service: Cardiovascular;  Laterality: Left;   LOWER EXTREMITY ANGIOGRAPHY Left 09/01/2016   Procedure: Lower Extremity Angiography;  Surgeon: Algernon Huxley, MD;  Location: [place] [facility];  Service: Cardiovascular;  Laterality: Left;   LOWER EXTREMITY ANGIOGRAPHY Left 10/14/2016   Procedure: Lower Extremity Angiography;  Surgeon: Algernon Huxley, MD;  Location: [place] [facility];  Service: Cardiovascular;  Laterality: Left;   LOWER EXTREMITY ANGIOGRAPHY Left 12/20/2016   Procedure: Lower Extremity Angiography;  Surgeon: Algernon Huxley, MD;  Location: [place] [facility];  Service: Cardiovascular;  Laterality: Left;   LOWER EXTREMITY ANGIOGRAPHY Right 12/18/2018   Procedure: LOWER EXTREMITY ANGIOGRAPHY;  Surgeon: Algernon Huxley, MD;  Location: [place] [facility];  Service: Cardiovascular;  Laterality: Right;   LOWER EXTREMITY INTERVENTION  09/01/2016   Procedure: Lower Extremity Intervention;  Surgeon: Algernon Huxley, MD;  Location: [place] [facility];  Service: Cardiovascular;;   PROSTATE SURGERY     removal   TONSILLECTOMY     as a child    Social History:  reports that he quit smoking about 27 years ago. His smoking use included cigarettes. He  smoked an average of .25 packs per day. He has never used smokeless tobacco. He reports that he does not drink alcohol and does not use drugs.  Family History:  Family History  Problem Relation Age of Onset   Hypertension Mother    Diabetes Mother    Prostate cancer Neg Hx    Bladder Cancer Neg Hx    Kidney disease Neg Hx      Prior to Admission medications   Medication Sig Start Date End Date Taking? Authorizing Provider  aspirin 81 MG EC tablet Take 81 mg by mouth daily.    [provider]  atorvastatin (LIPITOR) 80 MG tablet Take 80 mg by mouth at bedtime.    [provider]  bisacodyl (DULCOLAX) 5 MG EC tablet Take 1 tablet (5 mg total)  by mouth daily as needed for moderate constipation. 04/10/20   Lorella Nimrod, MD  diltiazem (CARDIZEM CD) 120 MG 24 hr capsule Take 120 mg by mouth daily. 10/04/20   [provider]  divalproex (DEPAKOTE) 500 MG DR tablet Take 500 mg by mouth daily.  02/18/20   [provider]  docusate sodium (COLACE) 100 MG capsule Take 1 capsule (100 mg total) by mouth 2 (two) times daily. 09/01/21 09/01/22  Blake Divine, MD  Ensure Max Protein (ENSURE MAX PROTEIN) LIQD Take 330 mLs (11 oz total) by mouth 2 (two) times daily. 04/10/20   Lorella Nimrod, MD  ferrous sulfate 325 (65 FE) MG tablet Take 325 mg by mouth daily with breakfast.    [provider]  FLUoxetine (PROZAC) 20 MG capsule Take 20 mg by mouth daily. 04/21/21   [provider]  Insulin Glargine (BASAGLAR KWIKPEN) 100 UNIT/ML Inject 20 Units into the skin 2 (two) times daily. 03/11/21   [name] Mandes, MD  isosorbide mononitrate (IMDUR) 120 MG 24 hr tablet Take 1 tablet (120 mg total) by mouth daily. 10/26/20   [name] Mandes, MD  metoprolol succinate (TOPROL-XL) 25 MG 24 hr tablet Take 1 tablet (25 mg total) by mouth daily. 03/12/21   [name] Mandes, MD  Multiple Vitamin (MULTIVITAMIN) tablet Take 1 tablet by mouth daily.    [provider]  nitroGLYCERIN (NITROSTAT) 0.4 MG SL tablet Place 1 tablet (0.4 mg total) under the tongue every 5 (five) minutes as needed for chest pain. 10/25/20   [name] Mandes, MD  NOVOLOG FLEXPEN 100 UNIT/ML FlexPen Inject 6 Units into the skin 3 (three) times daily with meals. Patient taking differently: Inject 4 Units into the skin 3 (three) times daily with meals. 11/10/20   Sharen Hones, MD  Rivaroxaban (XARELTO) 15 MG TABS tablet Take 1 tablet (15 mg total) by mouth daily with supper. Hold Xarelto for 3 days.  Start on January 09, 2019. Patient taking differently: Take 15 mg by mouth daily with supper. 01/09/19   [name], MD    Physical Exam: Vitals:   08/14/22 1100 08/14/22 1130  08/14/22 1200 08/14/22 1230  BP: (!) 139/94 (!) 139/91 (!) 132/94 (!) 154/104  Pulse: 99 (!) 106  76  Resp: (!) 22 19 20  (!) 22  Temp:      TempSrc:      SpO2: 95% 95% 94% 95%   General: Not in acute distress HEENT:       Eyes: PERRL, EOMI, no scleral icterus.       ENT: No discharge from the ears and nose, no pharynx injection, no tonsillar enlargement.        Neck: positive JVD,  no bruit, no mass felt. Heme: No neck lymph node enlargement. Cardiac: S1/S2, irregularly irregular rhythm, no gallops or rubs. Respiratory: Has fine crackles bilaterally GI: Soft, nondistended, nontender, no rebound pain, no organomegaly, BS present. GU: No hematuria Ext: s/p of bilateral BKA Musculoskeletal: No joint deformities, No joint redness or warmth, no limitation of ROM in spin. Skin: No rashes.  Neuro: Alert, oriented X3, cranial nerves II-XII grossly intact, moves all extremities  Psych: Patient is not psychotic, no suicidal or hemocidal ideation.  Labs on Admission: I have personally reviewed following labs and imaging studies  CBC: Recent Labs  Lab 08/14/22 0457  WBC 13.4*  NEUTROABS 10.6*  HGB 14.3  HCT 43.9  MCV 91.8  PLT 99991111   Basic Metabolic Panel: Recent Labs  Lab 08/14/22 0457  NA 141  K 4.8  CL 106  CO2 25  GLUCOSE 126*  BUN 46*  CREATININE 1.78*  CALCIUM 8.9  MG 2.2   GFR: CrCl cannot be calculated (Unknown ideal weight.). Liver Function Tests: Recent Labs  Lab 08/14/22 0457  AST 35  ALT 50*  ALKPHOS 110  BILITOT 0.7  PROT 7.0  ALBUMIN 3.3*   No results for input(s): "LIPASE", "AMYLASE" in the last 168 hours. No results for input(s): "AMMONIA" in the last 168 hours. Coagulation Profile: No results for input(s): "INR", "PROTIME" in the last 168 hours. Cardiac Enzymes: No results for input(s): "CKTOTAL", "CKMB", "CKMBINDEX", "TROPONINI" in the last 168 hours. BNP (last 3 results) No results for input(s): "PROBNP" in the last 8760 hours. HbA1C: No  results for input(s): "HGBA1C" in the last 72 hours. CBG: Recent Labs  Lab 08/14/22 1126  GLUCAP 272*   Lipid Profile: No results for input(s): "CHOL", "HDL", "LDLCALC", "TRIG", "CHOLHDL", "LDLDIRECT" in the last 72 hours. Thyroid Function Tests: No results for input(s): "TSH", "T4TOTAL", "FREET4", "T3FREE", "THYROIDAB" in the last 72 hours. Anemia Panel: No results for input(s): "VITAMINB12", "FOLATE", "FERRITIN", "TIBC", "IRON", "RETICCTPCT" in the last 72 hours. Urine analysis:    Component Value Date/Time   COLORURINE STRAW (A) 08/14/2022 0844   APPEARANCEUR CLEAR (A) 08/14/2022 0844   LABSPEC 1.008 08/14/2022 0844   PHURINE 7.0 08/14/2022 [place] 08/14/2022 0844   HGBUR NEGATIVE 08/14/2022 0844   BILIRUBINUR NEGATIVE 08/14/2022 0844   KETONESUR NEGATIVE 08/14/2022 0844   PROTEINUR 100 (A) 08/14/2022 0844   NITRITE NEGATIVE 08/14/2022 0844   LEUKOCYTESUR NEGATIVE 08/14/2022 0844   Sepsis Labs: @LABRCNTIP (procalcitonin:4,lacticidven:4) ) Recent Results (from the past 240 hour(s))  Resp panel by RT-PCR (RSV, Flu A&B, Covid) Anterior Nasal Swab     Status: None   Collection Time: 08/14/22  6:27 AM   Specimen: Anterior Nasal Swab  Result Value Ref Range Status   SARS Coronavirus 2 by RT PCR NEGATIVE NEGATIVE Final    Comment: (NOTE) SARS-CoV-2 target nucleic acids are NOT DETECTED.  The SARS-CoV-2 RNA is generally detectable in upper respiratory specimens during the acute phase of infection. The lowest concentration of SARS-CoV-2 viral copies this assay can detect is 138 copies/mL. A negative result does not preclude SARS-Cov-2 infection and should not be used as the sole basis for treatment or other patient management decisions. A negative result may occur with  improper specimen collection/handling, submission of specimen other than nasopharyngeal swab, presence of viral mutation(s) within the areas targeted by this assay, and inadequate number of  viral copies(<138 copies/mL). A negative result must be combined with clinical observations, patient history, and epidemiological information. The expected result is  Negative.  Fact Sheet for Patients:  EntrepreneurPulse.com.au  Fact Sheet for Healthcare Providers:  IncredibleEmployment.be  This test is no t yet approved or cleared by the [place] FDA and  has been authorized for detection and/or diagnosis of SARS-CoV-2 by FDA under an Emergency Use Authorization (EUA). This EUA will remain  in effect (meaning this test can be used) for the duration of the COVID-19 declaration under Section 564(b)(1) of the Act, 21 U.S.C.section 360bbb-3(b)(1), unless the authorization is terminated  or revoked sooner.       Influenza A by PCR NEGATIVE NEGATIVE Final   Influenza B by PCR NEGATIVE NEGATIVE Final    Comment: (NOTE) The Xpert Xpress SARS-CoV-2/FLU/RSV plus assay is intended as an aid in the diagnosis of influenza from Nasopharyngeal swab specimens and should not be used as a sole basis for treatment. Nasal washings and aspirates are unacceptable for Xpert Xpress SARS-CoV-2/FLU/RSV testing.  Fact Sheet for Patients: EntrepreneurPulse.com.au  Fact Sheet for Healthcare Providers: IncredibleEmployment.be  This test is not yet approved or cleared by the [place] FDA and has been authorized for detection and/or diagnosis of SARS-CoV-2 by FDA under an Emergency Use Authorization (EUA). This EUA will remain in effect (meaning this test can be used) for the duration of the COVID-19 declaration under Section 564(b)(1) of the Act, 21 U.S.C. section 360bbb-3(b)(1), unless the authorization is terminated or revoked.     Resp Syncytial Virus by PCR NEGATIVE NEGATIVE Final    Comment: (NOTE) Fact Sheet for Patients: EntrepreneurPulse.com.au  Fact Sheet for Healthcare  Providers: IncredibleEmployment.be  This test is not yet approved or cleared by the [place] FDA and has been authorized for detection and/or diagnosis of SARS-CoV-2 by FDA under an Emergency Use Authorization (EUA). This EUA will remain in effect (meaning this test can be used) for the duration of the COVID-19 declaration under Section 564(b)(1) of the Act, 21 U.S.C. section 360bbb-3(b)(1), unless the authorization is terminated or revoked.  Performed at [facility], [address], [place], [place] [address]      Radiological Exams on Admission: DG Chest Portable 1 View  Result Date: 08/14/2022 CLINICAL DATA:  Shortness of breath and hypoxia. EXAM: PORTABLE CHEST 1 VIEW COMPARISON:  03/06/2021. FINDINGS: The cardio pericardial silhouette is enlarged. Interstitial markings are diffusely coarsened with chronic features. Stable asymmetric elevation right hemidiaphragm. Telemetry leads overlie the chest. IMPRESSION: Chronic interstitial changes without acute cardiopulmonary findings. Electronically Signed   By: Misty Stanley M.D.   On: 08/14/2022 05:19      Assessment/Plan Principal Problem:   Acute on chronic systolic CHF (congestive heart failure) (HCC) Active Problems:   Acute respiratory failure with hypoxia (HCC)   Atrial fibrillation with rapid ventricular response (HCC)   CAD (coronary artery disease)   Myocardial injury   Seizure disorder ([place])   Hyperlipidemia   HTN (hypertension)   Chronic kidney disease, stage 3a (HCC)   Type II diabetes mellitus with renal manifestations (HCC)   Leukocytosis   Depression   S/P bilateral BKA (below knee amputation) ([place])   Assessment and Plan:  Acute respiratory failure with hypoxia due to acute on chronic systolic CHF (congestive heart failure) ([place]): 2D echo on 12/14/2020 showed EF of 35-40%.  Patient has elevated BNP 896, positive JVD, crackles on auscultation, clinically consistent with CHF  exacerbation.  -Will admit to tele as inpatient -Lasix 40 mg bid by IV (patient received 60 mg of Lasix in ED) -2d echo -Daily weights -strict I/O's -Low salt diet -  Fluid restriction -Obtain REDs Vest reading  Atrial fibrillation with rapid ventricular response ([place]): Heart rate 115 -Continue Xarelto -Cardizem drip -Continue home oral Cardizem 120 mg daily, metoprolol 25 mg daily  CAD (coronary artery disease) and myocardial injury: trop 37 --> 44 --> 47 --> 48.  Patient had A1c 8.8 on 07/27/2022.  LDL 77 on 07/27/2022. -Aspirin, Lipitor, imdur -Follow-up 2D echo  Seizure disorder ([place]) -Seizure precaution -As needed Ativan for seizure -Continue home Keppra  Hyperlipidemia -Lipitor  HTN (hypertension) -IV hydralazine as needed -Cardizem  Chronic kidney disease, stage 3a ([place]): Slightly worsened than baseline.  Recent baseline creatinine 1.5 on 07/27/2022.  His creatinine is 1.78, BUN 46, GFR 37. -Monitor renal function closely -Avoid using renal toxic medications  Type II diabetes mellitus with renal manifestations [facility]): Recent A1c 8.8, poorly controlled.  Patient taking NovoLog and glargine insulin 45 units daily -Sliding scale insulin -Glargine insulin 20 units daily  Leukocytosis: WBC 13.4, no fever, no source of infection identified, likely reactive -Follow-up urinalysis  Depression -Continue home medications  S/P bilateral BKA (below knee amputation) ([place]) -Fall precaution     DVT ppx: on Xarelto  Code Status: DNR per pt and his son  Family Communication:    Yes, patient's wife at bed side, and his son  by phone  Disposition Plan:  Anticipate discharge back to previous environment  Consults called:  none  Admission status and Level of care: Telemetry Cardiac:    as inpt     Dispo: The patient is from: Home              Anticipated d/c is to: Home              Anticipated d/c date is: 2 days              Patient currently is not medically stable to  d/c.    Severity of Illness:  The appropriate patient status for this patient is INPATIENT. Inpatient status is judged to be reasonable and necessary in order to provide the required intensity of service to ensure the patient's safety. The patient's presenting symptoms, physical exam findings, and initial radiographic and laboratory data in the context of their chronic comorbidities is felt to place them at high risk for further clinical deterioration. Furthermore, it is not anticipated that the patient will be medically stable for discharge from the hospital within 2 midnights of admission.   * I certify that at the point of admission it is my clinical judgment that the patient will require inpatient hospital care spanning beyond 2 midnights from the point of admission due to high intensity of service, high risk for further deterioration and high frequency of surveillance required.*       Date of Service 08/14/2022    Ivor Costa [facility]   If 7PM-7AM, please contact night-coverage [email or web address] 08/14/2022, 12:51 PM

## 2022-08-14 NOTE — ED Triage Notes (Signed)
Patient brought in via medic for sudden onset of SOB. Patient sat at 83% upon firefighter arrival, placed on 15L non re breather, and sat improved to 97%.

## 2022-08-14 NOTE — ED Notes (Signed)
Pt given breakfast tray

## 2022-08-14 NOTE — ED Notes (Signed)
Pt calling out to use phone to call son/daughter to care for dog at home.

## 2022-08-14 NOTE — ED Provider Notes (Signed)
[facility] Provider Note    Event Date/Time   First MD Initiated Contact with Patient 08/14/22 [phone]     (approximate)   History   Shortness of Breath   HPI  [name] is a 88 y.o. male who presents to the ED for evaluation of Shortness of Breath   I reviewed PCP visit from 3/5.  History of DM and bilateral BKA.  Anticoagulant Xarelto for atrial fibrillation.  Pulmonary hypertension and CHF and mitral/tricuspid insufficiency  Patient presents to the ED for evaluation of a few hours of dyspnea and associated hypoxia.  He reports going to bed at his baseline but awoke gasping for breath and could not breathe.  No pain, fevers or cough, just dyspnea.  EMS found him with sats of around 83% on room air.   Physical Exam   Triage Vital Signs: ED Triage Vitals  Enc Vitals Group     BP      Pulse      Resp      Temp      Temp src      SpO2      Weight      Height      Head Circumference      Peak Flow      Pain Score      Pain Loc      Pain Edu?      Excl. in [place]?     Most recent vital signs: Vitals:   08/14/22 0446 08/14/22 0457  BP: (!) 141/104   Pulse: (!) 115   Resp: 18   Temp: 98.1 F (36.7 C)   SpO2: 93% 97%    General: Awake, no distress.  Looks systemically well, making some jokes CV:  Good peripheral perfusion.  Tachycardic and irregular Resp:  Mildly tachypneic to the low/mid 20s Abd:  No distention.  Soft MSK:  No deformity noted.  Bilateral well-healed BKA's Neuro:  No focal deficits appreciated. Other:     ED Results / Procedures / Treatments   Labs (all labs ordered are listed, but only abnormal results are displayed) Labs Reviewed  COMPREHENSIVE METABOLIC PANEL - Abnormal; Notable for the following components:      Result Value   Glucose, Bld 126 (*)    BUN 46 (*)    Creatinine, Ser 1.78 (*)    Albumin 3.3 (*)    ALT 50 (*)    GFR, Estimated 37 (*)    All other components within normal limits  CBC  WITH DIFFERENTIAL/PLATELET - Abnormal; Notable for the following components:   WBC 13.4 (*)    Neutro Abs 10.6 (*)    All other components within normal limits  BRAIN NATRIURETIC PEPTIDE - Abnormal; Notable for the following components:   B Natriuretic Peptide 896.7 (*)    All other components within normal limits  TROPONIN I (HIGH SENSITIVITY) - Abnormal; Notable for the following components:   Troponin I (High Sensitivity) 37 (*)    All other components within normal limits  RESP PANEL BY RT-PCR (RSV, FLU A&B, COVID)  [id number]  MAGNESIUM  TROPONIN I (HIGH SENSITIVITY)    EKG A-fib with RVR with a rate of 126 bpm.  Poor quality EKG.  No clear signs of acute ischemia.  RADIOLOGY 1 view CXR interpreted by me with pulmonary vascular congestion  Official radiology report(s): DG Chest Portable 1 View  Result Date: 08/14/2022 CLINICAL DATA:  Shortness of breath and hypoxia. EXAM: PORTABLE CHEST  1 VIEW COMPARISON:  03/06/2021. FINDINGS: The cardio pericardial silhouette is enlarged. Interstitial markings are diffusely coarsened with chronic features. Stable asymmetric elevation right hemidiaphragm. Telemetry leads overlie the chest. IMPRESSION: Chronic interstitial changes without acute cardiopulmonary findings. Electronically Signed   By: Misty Stanley M.D.   On: 08/14/2022 05:19    PROCEDURES and INTERVENTIONS:  .1-3 Lead EKG Interpretation  Performed by: Vladimir Crofts, MD Authorized by: Vladimir Crofts, MD     Interpretation: abnormal     ECG rate:  121   ECG rate assessment: tachycardic     Rhythm: atrial fibrillation     Ectopy: none     Conduction: normal   .Critical Care  Performed by: Vladimir Crofts, MD Authorized by: Vladimir Crofts, MD   Critical care provider statement:    Critical care time (minutes):  30   Critical care time was exclusive of:  Separately billable procedures and treating other patients   Critical care was necessary to treat or prevent imminent or  life-threatening deterioration of the following conditions:  Respiratory failure   Critical care was time spent personally by me on the following activities:  Development of treatment plan with patient or surrogate, discussions with consultants, evaluation of patient's response to treatment, examination of patient, ordering and review of laboratory studies, ordering and review of radiographic studies, ordering and performing treatments and interventions, pulse oximetry, re-evaluation of patient's condition and review of old charts   Medications  diltiazem (CARDIZEM) 125 mg in dextrose 5% 125 mL (1 mg/mL) infusion (5 mg/hr Intravenous New Bag/Given 08/14/22 0625)  diltiazem (CARDIZEM) injection 10 mg (10 mg Intravenous Given 08/14/22 0555)  furosemide (LASIX) injection 60 mg (60 mg Intravenous Given 08/14/22 [id number])     IMPRESSION / MDM / [place] / ED COURSE  I reviewed the triage vital signs and the nursing notes.  Differential diagnosis includes, but is not limited to, CHF exacerbation, pneumonia, pneumothorax, ACS  {Patient presents with symptoms of an acute illness or injury that is potentially life-threatening.  88 year old male presents to the ED with dyspnea and hypoxia, likely a CHF exacerbation requiring medical admission.  He is tachycardic in atrial fibrillation but stable without shock.  Requiring IV diltiazem for rate control.  Mild tachypnea and dyspnea, but looks systemically well without indication for BiPAP.  CXR is congested and his BNP is elevated.  Blood work with mild leukocytosis, but no cough or radiographic evidence of pneumonia.  Metabolic panel with CKD around baseline and his troponin is slightly elevated and we will trend this.  I suspect due to his respiratory status.  We will initiate diuresis alongside his diltiazem and consult medicine for admission  Clinical Course as of 08/14/22 0629  Sat Aug 14, 2022  0607 Reassessed and discussed plan of care.  He is in  agreement. [DS]    Clinical Course User Index [DS] Vladimir Crofts, MD     FINAL CLINICAL IMPRESSION(S) / ED DIAGNOSES   Final diagnoses:  Hypoxia  Atrial fibrillation with RVR ([place])  Acute on chronic congestive heart failure, unspecified heart failure type [facility])     Rx / DC Orders   ED Discharge Orders     None        Note:  This document was prepared using Dragon voice recognition software and may include unintentional dictation errors.   Vladimir Crofts, MD 08/14/22 0630

## 2022-08-15 ENCOUNTER — Inpatient Hospital Stay
Admit: 2022-08-15 | Discharge: 2022-08-15 | Disposition: A | Discharge status: Home / Self Care | Payer: Medicare Other | Attending: Internal Medicine | Admitting: Internal Medicine

## 2022-08-15 DIAGNOSIS — I5023 Acute on chronic systolic (congestive) heart failure: Secondary | ICD-10-CM | POA: Diagnosis not present

## 2022-08-15 LAB — CBC
HCT: 38.4 % — ABNORMAL LOW (ref 39.0–52.0)
Hemoglobin: 12.9 g/dL — ABNORMAL LOW (ref 13.0–17.0)
MCH: 29.9 pg (ref 26.0–34.0)
MCHC: 33.6 g/dL (ref 30.0–36.0)
MCV: 89.1 fL (ref 80.0–100.0)
Platelets: 208 10*3/uL (ref 150–400)
RBC: 4.31 MIL/uL (ref 4.22–5.81)
RDW: 14.3 % (ref 11.5–15.5)
WBC: 9 10*3/uL (ref 4.0–10.5)
nRBC: 0 % (ref 0.0–0.2)

## 2022-08-15 LAB — ECHOCARDIOGRAM COMPLETE
AR max vel: 2.85 cm2
AV Peak grad: 7.7 mmHg
Ao pk vel: 1.39 m/s
Area-P 1/2: 5.79 cm2
Calc EF: 50.4 %
S' Lateral: 5.5 cm
Single Plane A2C EF: 51.6 %
Single Plane A4C EF: 37.3 %

## 2022-08-15 LAB — BASIC METABOLIC PANEL
Anion gap: 7 (ref 5–15)
BUN: 46 mg/dL — ABNORMAL HIGH (ref 8–23)
CO2: 24 mmol/L (ref 22–32)
Calcium: 8.4 mg/dL — ABNORMAL LOW (ref 8.9–10.3)
Chloride: 106 mmol/L (ref 98–111)
Creatinine, Ser: 1.84 mg/dL — ABNORMAL HIGH (ref 0.61–1.24)
GFR, Estimated: 35 mL/min — ABNORMAL LOW (ref >60)
Glucose, Bld: 151 mg/dL — ABNORMAL HIGH (ref 70–99)
Potassium: 4.1 mmol/L (ref 3.5–5.1)
Sodium: 137 mmol/L (ref 135–145)

## 2022-08-15 LAB — GLUCOSE, CAPILLARY
Glucose-Capillary: 125 mg/dL — ABNORMAL HIGH (ref 70–99)
Glucose-Capillary: 59 mg/dL — ABNORMAL LOW (ref 70–99)
Glucose-Capillary: 132 mg/dL — ABNORMAL HIGH (ref 70–99)
Glucose-Capillary: 189 mg/dL — ABNORMAL HIGH (ref 70–99)
Glucose-Capillary: 145 mg/dL — ABNORMAL HIGH (ref 70–99)

## 2022-08-15 LAB — MAGNESIUM: Magnesium: 2.1 mg/dL (ref 1.7–2.4)

## 2022-08-15 MED ORDER — PERFLUTREN LIPID MICROSPHERE
1.0000 mL | INTRAVENOUS | Status: AC | PRN
  Administered 2022-08-15: 5 mL via INTRAVENOUS

## 2022-08-15 MED ORDER — DILTIAZEM HCL 30 MG PO TABS: 60.0000 mg | ORAL_TABLET | Freq: Four times a day (QID) | ORAL | Status: DC | PRN

## 2022-08-15 NOTE — Assessment & Plan Note (Signed)
Due to PVD. --Fall precautions

## 2022-08-15 NOTE — Assessment & Plan Note (Signed)
On Lipitor 

## 2022-08-15 NOTE — Assessment & Plan Note (Signed)
WBC 13.4, no fever, no source of infection identified, likely reactive.  UA benign. --Monitor CBC

## 2022-08-15 NOTE — Assessment & Plan Note (Signed)
See "CAD"

## 2022-08-15 NOTE — Assessment & Plan Note (Signed)
Recent A1c 8.8, poorly controlled.  Patient taking NovoLog and glargine insulin 45 units daily --Sliding scale Novolog ---Glargine insulin 20 units daily

## 2022-08-15 NOTE — Assessment & Plan Note (Signed)
No chest pain with CHF and rapid A-fib. Minimal Troponin Elevation (37>>44>>47>>48) likely demand ischemia with rapid Afib and CHF. Seems stable, but regional WMA's noted on Echo. --Consult cardiology --Continue ASA, Lipitor, Toprol-XL, Imdur

## 2022-08-15 NOTE — Assessment & Plan Note (Signed)
Continue Cardizem, Toprol-XL IV hydralazine PRN Also on IV Lasix

## 2022-08-15 NOTE — Progress Notes (Signed)
  Echocardiogram 2D Echocardiogram has been performed. Definity IV ultrasound imaging agent used on this study.  [name] 08/15/2022, 10:41 AM

## 2022-08-15 NOTE — Assessment & Plan Note (Signed)
Stop Cardizem drip this AM Continue long acting PO Cardizem 120 mg daily Continue Toprol-XL 25 mg daily Will use short acting PO Cardizem Q6H PRN for HR sustaining above 110 bpm Telemetry monitoring

## 2022-08-15 NOTE — Progress Notes (Signed)
Progress Note   Patient: [name] [id number] DOB: 17-Apr-1935 DOA: 08/14/2022     1 DOS: the patient was seen and examined on 08/15/2022   Brief hospital course:  Elieser Garvis is a 88 y.o. male with medical history significant of sCHF with EF 35-40%, HTN, HLD, CAD, A fib on Xarelto, hypothyroidism, CKD-3A, seizure, prostate cancer, PVD, s/p of bilateral BKA, who presented on 08/14/2022 for evaluation of progressively worsening  shortness of breath, palpitations and hypoxia. Not on supplemental oxygen at baseline, but found to have oxygen desaturation to 83% on room air, and using accessory muscle for breathing, unable to speak in full sentence.  He was initially started on 15 L nonrebreather oxygen, then improved to 2 L oxygen with 97% of saturation.     ED evaluation - found to have BNP 896, WBC 13.4, worsening renal function, negative PCR for COVID, flu and RSV, temperature normal, blood pressure 135/96, atrial fibrillation with RVR with a heart rate 115, RR 26.  Chest x-ray showed chronic interstitial changes without infiltration.  Patient is admitted to telemetry bed as inpatient.  He was started on IV Cardizem drip for A-Fib RVR and IV Lasix for diuresis given signs of CHF decompensation likely driven by A-fib RVR.  Assessment and Plan: * Acute on chronic systolic CHF (congestive heart failure) ([place]) Echo on 12/14/2020 showed EF of 35-40%.  Patient has elevated BNP 896, positive JVD, crackles on auscultation, clinically consistent with CHF decompensation. ECHO -- EF 35-40% with LV regional WMA's, normal diastolic function, mild-mod MR --Will consult [facility] given echo findings may warrant ischemic evaluation --Continue IV Lasix 40 mg BID --Strict I/O's, Daily Weights --Monitor renal function, electrolytes --Low sodium diet, fluid restriction  Acute respiratory failure with hypoxia (HCC) Due to CHF decompensation and A-fib RVR. --Mgmt of underlying issues as  outlined --Supplement O2 to keep sats > 90% --Wean O2 as tolerated  Atrial fibrillation with rapid ventricular response (HCC) Stop Cardizem drip this AM Continue long acting PO Cardizem 120 mg daily Continue Toprol-XL 25 mg daily Will use short acting PO Cardizem Q6H PRN for HR sustaining above 110 bpm Telemetry monitoring   Myocardial injury See CAD  CAD (coronary artery disease) No chest pain with CHF and rapid A-fib. Minimal Troponin Elevation (37>>44>>47>>48) likely demand ischemia with rapid Afib and CHF. Seems stable, but regional WMA's noted on Echo. --Consult cardiology --Continue ASA, Lipitor, Toprol-XL, Imdur  Seizure disorder (HCC) --Seizure precaution --As needed Ativan for seizure --Continue home Keppra  HTN (hypertension) Continue Cardizem, Toprol-XL IV hydralazine PRN Also on IV Lasix  Hyperlipidemia On Lipitor   Chronic kidney disease, stage 3a ([place]) Slightly worsened than baseline.   Recent baseline Cr 1.5 on 07/27/2022.   Cr on admission 1.78 --Monitor renal function closely --Avoid using renal toxic medications  Type II diabetes mellitus with renal manifestations (HCC) Recent A1c 8.8, poorly controlled.  Patient taking NovoLog and glargine insulin 45 units daily --Sliding scale Novolog ---Glargine insulin 20 units daily  Leukocytosis WBC 13.4, no fever, no source of infection identified, likely reactive.  UA benign. --Monitor CBC  Depression Continue home medication   S/P bilateral BKA (below knee amputation) ([place]) Due to PVD. --Fall precautions        Subjective: Pt seen awake resting in bed this AM.  He reports feeling much better today, no longer short of breath.  He denies chest pain pressure or discomfort.  Denies palpitations.     Physical Exam: Vitals:   08/15/22  1030 08/15/22 1100 08/15/22 1130 08/15/22 1601  BP:  123/89  115/81  Pulse: (!) 48 68 67 88  Resp: (!) 24 19 (!) 23 16  Temp:      TempSrc:      SpO2: 97% 94%  97% 95%   General exam: awake, alert, no acute distress HEENT: atraumatic, clear conjunctiva, anicteric sclera, moist mucus membranes, hearing grossly normal  Respiratory system: CTAB, no wheezes, rales or rhonchi, normal respiratory effort. Cardiovascular system: normal S1/S2, regular rate, irregular rhythm   Gastrointestinal system: soft, NT, ND, no HSM felt, +bowel sounds. Central nervous system: A&O x4. no gross focal neurologic deficits, normal speech Extremities: b/l BKA's Skin: dry, intact, normal temperature Psychiatry: normal mood, congruent affect, judgement and insight appear normal    Data Reviewed:  Notable labs ---   glucose 151, BUN 46, Cr 1.78>> 1.84, Ca 8.4, Hbg 12.9  ECHO 08/15/2022 -- EF 35-40% with LV regional WMA's, normal diastolic function, mild-mod MR  Family Communication: None present, will attempt to call daughter this afternoon   Disposition: Status is: Inpatient Remains inpatient appropriate because: remains on IV lasix and ongoing evaluation   Planned Discharge Destination: Home    Time spent: 42 minutes  Author: Ezekiel Slocumb, DO 08/15/2022 4:35 PM  For on call review [email or web address].

## 2022-08-15 NOTE — Hospital Course (Signed)
[name] is a 88 y.o. male with medical history significant of sCHF with EF 35-40%, HTN, HLD, CAD, A fib on Xarelto, hypothyroidism, CKD-3A, seizure, prostate cancer, PVD, s/p of bilateral BKA, who presented on 08/14/2022 for evaluation of progressively worsening  shortness of breath, palpitations and hypoxia. Not on supplemental oxygen at baseline, but found to have oxygen desaturation to 83% on room air, and using accessory muscle for breathing, unable to speak in full sentence.  He was initially started on 15 L nonrebreather oxygen, then improved to 2 L oxygen with 97% of saturation.     ED evaluation - found to have BNP 896, WBC 13.4, worsening renal function, negative PCR for COVID, flu and RSV, temperature normal, blood pressure 135/96, atrial fibrillation with RVR with a heart rate 115, RR 26.  Chest x-ray showed chronic interstitial changes without infiltration.  Patient is admitted to telemetry bed as inpatient.  He was started on IV Cardizem drip for A-Fib RVR and IV Lasix for diuresis given signs of CHF decompensation likely driven by A-fib RVR.

## 2022-08-15 NOTE — Assessment & Plan Note (Signed)
-   Continue home medication 

## 2022-08-15 NOTE — Assessment & Plan Note (Signed)
Due to CHF decompensation and A-fib RVR. 3/24 - O2 sat dropping into 80's when trialed on room air --Mgmt of underlying issues as outlined --Supplement O2 to keep sats > 90% --Wean O2 as tolerated

## 2022-08-15 NOTE — Assessment & Plan Note (Signed)
-  Seizure precaution -As needed Ativan for seizure -Continue home Keppra 

## 2022-08-15 NOTE — Assessment & Plan Note (Signed)
Echo on 12/14/2020 showed EF of 35-40%.  Patient has elevated BNP 896, positive JVD, crackles on auscultation, clinically consistent with CHF decompensation. ECHO -- EF 35-40% with LV regional WMA's, normal diastolic function, mild-mod MR Status post IV Lasix --[facility] consulted, appreciate recs --Stop IV Lasix 40 mg BID given Cr rising --Strict I/O's, Daily Weights --Monitor renal function, electrolytes --Low sodium diet, fluid restriction

## 2022-08-15 NOTE — Assessment & Plan Note (Signed)
Slightly worsened than baseline.   Recent baseline Cr 1.5 on 07/27/2022.   Cr on admission 1.78 Cr trend: 1.78>>1.84>>2.14 --Stop Lasix --Monitor renal function closely --Avoid using renal toxic medications

## 2022-08-16 ENCOUNTER — Other Ambulatory Visit (HOSPITAL_COMMUNITY): Payer: Self-pay

## 2022-08-16 DIAGNOSIS — I5023 Acute on chronic systolic (congestive) heart failure: Secondary | ICD-10-CM | POA: Diagnosis not present

## 2022-08-16 LAB — GLUCOSE, CAPILLARY
Glucose-Capillary: 135 mg/dL — ABNORMAL HIGH (ref 70–99)
Glucose-Capillary: 270 mg/dL — ABNORMAL HIGH (ref 70–99)
Glucose-Capillary: 141 mg/dL — ABNORMAL HIGH (ref 70–99)
Glucose-Capillary: 225 mg/dL — ABNORMAL HIGH (ref 70–99)

## 2022-08-16 LAB — BASIC METABOLIC PANEL
Anion gap: 13 (ref 5–15)
BUN: 66 mg/dL — ABNORMAL HIGH (ref 8–23)
CO2: 25 mmol/L (ref 22–32)
Calcium: 8.9 mg/dL (ref 8.9–10.3)
Chloride: 100 mmol/L (ref 98–111)
Creatinine, Ser: 2.14 mg/dL — ABNORMAL HIGH (ref 0.61–1.24)
GFR, Estimated: 30 mL/min — ABNORMAL LOW (ref >60)
Glucose, Bld: 170 mg/dL — ABNORMAL HIGH (ref 70–99)
Potassium: 4.2 mmol/L (ref 3.5–5.1)
Sodium: 138 mmol/L (ref 135–145)

## 2022-08-16 MED ORDER — METOPROLOL SUCCINATE ER 25 MG PO TB24
25.0000 mg | ORAL_TABLET | Freq: Two times a day (BID) | ORAL | Status: DC
  Administered 2022-08-16: 25 mg via ORAL
  Filled 2022-08-16: qty 1

## 2022-08-16 NOTE — Evaluation (Signed)
Physical Therapy Evaluation Patient Details Name: [name] MRN: [id number] DOB: 28-May-1934 Today's Date: 08/16/2022  History of Present Illness  is a 88 y.o. male with medical history significant of sCHF with EF 35-40%, HTN, HLD, CAD, A fib on Xarelto, hypothyroidism, CKD-3A, seizure, prostate cancer, PVD, s/p of bilateral BKA, who presents with shortness of breath.  MD assessment includes: acute on chronic systolic CHF, acute respiratory failure with hypoxia, A-fib with RVR, myocardial injury, and leukocytosis.   Clinical Impression  Pt was pleasant and motivated to participate during the session and put forth good effort throughout. Pt required min A with bed mobility tasks but only extra time, effort, and cuing with transfers and gait. Pt was able to amb 2 x 5 feet with his BLE prosthetic limbs with no overt LOB and with no adverse symptoms reported, SpO2 and HR WNL on 3LO2/min.  Pt will benefit from continued PT services upon discharge to safely address deficits listed in patient problem list for decreased caregiver assistance and eventual return to PLOF.         Recommendations for follow up therapy are one component of a multi-disciplinary discharge planning process, led by the attending physician.  Recommendations may be updated based on patient status, additional functional criteria and insurance authorization.  Follow Up Recommendations       Assistance Recommended at Discharge Intermittent Supervision/Assistance  Patient can return home with the following  A little help with walking and/or transfers;A little help with bathing/dressing/bathroom;Assistance with cooking/housework;Help with stairs or ramp for entrance;Assist for transportation    Equipment Recommendations None recommended by PT  Recommendations for Other Services       Functional Status Assessment Patient has had a recent decline in their functional status and demonstrates the ability to make  significant improvements in function in a reasonable and predictable amount of time.     Precautions / Restrictions Precautions Precautions: Fall Precaution Comments: Seizure precautions Restrictions Weight Bearing Restrictions: No      Mobility  Bed Mobility Overal bed mobility: Needs Assistance Bed Mobility: Supine to Sit, Sit to Supine     Supine to sit: Min assist Sit to supine: Min assist   General bed mobility comments: Min A for BLE and trunk control    Transfers Overall transfer level: Needs assistance Equipment used: Rolling walker (2 wheels) Transfers: Sit to/from Stand Sit to Stand: Min guard           General transfer comment: Mod verbal cues for sequencing    Ambulation/Gait Ambulation/Gait assistance: Min guard Gait Distance (Feet): 5 Feet x 2 Assistive device: Rolling walker (2 wheels) Gait Pattern/deviations: Step-through pattern, Decreased step length - right, Decreased step length - left, Trunk flexed Gait velocity: decreased     General Gait Details: Pt able to take several steps left/right and forwars/backwards at the EOB with flexed trunk posture and mod lean on the RW for support but no overt LOB  Stairs            Wheelchair Mobility    Modified Rankin (Stroke Patients Only)       Balance Overall balance assessment: Needs assistance Sitting-balance support: Bilateral upper extremity supported Sitting balance-Leahy Scale: Poor Sitting balance - Comments: Occasional posterior instability requiring min assist to correct while donning prothetic limbs Postural control: Posterior lean Standing balance support: Bilateral upper extremity supported, During functional activity, Reliant on assistive device for balance Standing balance-Leahy Scale: Fair  Pertinent Vitals/Pain Pain Assessment Pain Assessment: No/denies pain    Home Living Family/patient expects to be discharged to:: Private  residence Living Arrangements: Spouse/significant other Available Help at Discharge: Family;Available 24 hours/day Type of Home: House Home Access: Ramped entrance       Home Layout: One level Home Equipment: Wheelchair - Publishing copy (2 wheels);BSC/3in1;Grab bars - tub/shower;Grab bars - toilet Additional Comments: Son comes over in the mornings to assist with ADLs    Prior Function Prior Level of Function : Needs assist             Mobility Comments: Mod Ind amb with a RW limited household distances, no falls in the last 6 months, ambulates mostly for toileting from bathroom door to toilet ADLs Comments: Son comes over in the mornings to assist with ADLs     Hand Dominance   Dominant Hand: Right    Extremity/Trunk Assessment   Upper Extremity Assessment Upper Extremity Assessment: Generalized weakness    Lower Extremity Assessment Lower Extremity Assessment: Generalized weakness       Communication   Communication: HOH  Cognition Arousal/Alertness: Awake/alert Behavior During Therapy: WFL for tasks assessed/performed Overall Cognitive Status: Within Functional Limits for tasks assessed                                          General Comments      Exercises     Assessment/Plan    PT Assessment Patient needs continued PT services  PT Problem List Decreased strength;Decreased activity tolerance;Decreased balance;Decreased mobility;Decreased knowledge of use of DME       PT Treatment Interventions DME instruction;Gait training;Functional mobility training;Therapeutic activities;Therapeutic exercise;Balance training;Patient/family education    PT Goals (Current goals can be found in the Care Plan section)  Acute Rehab PT Goals Patient Stated Goal: To get stronger PT Goal Formulation: With patient Time For Goal Achievement: 08/29/22 Potential to Achieve Goals: Good    Frequency Min 2X/week     Co-evaluation                AM-PAC PT "6 Clicks" Mobility  Outcome Measure Help needed turning from your back to your side while in a flat bed without using bedrails?: A Little Help needed moving from lying on your back to sitting on the side of a flat bed without using bedrails?: A Little Help needed moving to and from a bed to a chair (including a wheelchair)?: A Little Help needed standing up from a chair using your arms (e.g., wheelchair or bedside chair)?: A Little Help needed to walk in hospital room?: A Little Help needed climbing 3-5 steps with a railing? : A Lot 6 Click Score: 17    End of Session Equipment Utilized During Treatment: Gait belt Activity Tolerance: Patient tolerated treatment well Patient left: in bed;with call bell/phone within reach;with bed alarm set Nurse Communication: Mobility status PT Visit Diagnosis: Difficulty in walking, not elsewhere classified (R26.2);Muscle weakness (generalized) (M62.81)    Time: [id number] PT Time Calculation (min) (ACUTE ONLY): 33 min   Charges:   PT Evaluation $PT Eval Moderate Complexity: 1 Mod PT Treatments $Therapeutic Activity: 8-22 mins       D. Scott Joon Pohle PT, DPT 08/16/22, 11:49 AM

## 2022-08-16 NOTE — Consult Note (Addendum)
[facility] NOTE       Patient ID: Glendal Marks MRN: [id number] DOB/AGE: 10/22/1934 88 y.o.  Admit date: 08/14/2022 Referring Physician Dr. Nicole Kindred Primary Physician Thornton Dales, NP Primary Cardiologist Dr. Nehemiah Massed Reason for Consultation AF RVR, AoCHF, abnormal echo   HPI: Tyri A. Uhland is an 88yoM with a PMH of paroxysmal AF (Xarelto), chronic HFrEF (35-40%, rWMAs 12/14/20), 1vCAD (CTO ostial LAD, 06/25/2019), CKD III, DM2, hx bilat BKA, who presented to [facility] ED 08/14/2022 with shortness of breath.  Cardiology is consulted for assistance with his heart failure.  The patient states that this past Friday he just woke up and "could not breathe."  His shortness of breath woke him up out of his sleep and when EMS arrived they found him hypoxic to 83% on room air.  He also felt as though his heart was racing. He takes Lasix every day but is unsure of the dose, has been compliant with his other cardiac medications.  He feels as though his abdomen is a little bit swollen, does not weigh himself regularly.  Denies any chest pain, lightheadedness or dizziness, or peripheral edema.  He has a history of bilateral BKA's and walks with prostheses.  Eating mostly TV dinners recently ([place]) but states his son is going to get him set up with Meals on Wheels once leaving the hospital.  At my time of evaluation he is sitting upright in bed after working with physical therapy.  Feels a lot better compared to when he first presented to the hospital, but remains on 3 L of supplemental oxygen with no requirement at baseline.  Recent vitals are notable for a blood pressure of 140/99, heart rate in the high 90s to mid 110s in atrial fibrillation on telemetry.  Saturating well on 3 L by nasal cannula.  Diuresed a net negative of 2.8 L since admission.  Labs notable for BUN/creatinine with slight worsening with diuresis to 66/2.14 and GFR of 30 today, compared to  creatinine 1.78 and GFR of 37 on admission 2 days ago.  BNP is elevated on admission 896, high-sensitivity troponin elevated borderline with a flat trend at 37, 44, 47, 48.  H&H 12.9/38.4.  Chest x-ray with chronic interstitial changes without acute cardiopulmonary findings.  Review of systems complete and found to be negative unless listed above     Past Medical History:  Diagnosis Date   Arthritis    Atrial fibrillation ([place])    Carcinoma of prostate ([place])    CHF (congestive heart failure) ([place])    Chronic kidney disease    Diabetes mellitus without complication [facility])    ED (erectile dysfunction)    Frequent urination    Hyperlipidemia    Hypertension    Moderate mitral insufficiency    Peripheral vascular disease ([place])    Pneumonia 04/2016   Prostate cancer ([place])    Sleep apnea    OSA--USE C-PAP   Ulcer of left foot due to type 2 diabetes mellitus ([place])    Urinary stress incontinence, male     Past Surgical History:  Procedure Laterality Date   AMPUTATION Left 01/12/2017   Procedure: AMPUTATION BELOW KNEE;  Surgeon: Algernon Huxley, MD;  Location: [facility] ORS;  Service: General;  Laterality: Left;   AMPUTATION Right 12/27/2018   Procedure: AMPUTATION BELOW KNEE;  Surgeon: Algernon Huxley, MD;  Location: [facility] ORS;  Service: General;  Laterality: Right;   APLIGRAFT PLACEMENT Left 10/15/2016   Procedure: APLIGRAFT PLACEMENT;  Surgeon: Albertine Patricia, DPM;  Location: [facility] ORS;  Service: Podiatry;  Laterality: Left;  necrotic ulcer   CHOLECYSTECTOMY     COLONOSCOPY WITH PROPOFOL N/A 01/02/2019   Procedure: COLONOSCOPY WITH PROPOFOL;  Surgeon: Lucilla Lame, MD;  Location: [facility] ENDOSCOPY;  Service: Endoscopy;  Laterality: N/A;   COLONOSCOPY WITH PROPOFOL N/A 01/05/2019   Procedure: COLONOSCOPY WITH PROPOFOL;  Surgeon: Lucilla Lame, MD;  Location: [facility] ENDOSCOPY;  Service: Endoscopy;  Laterality: N/A;   ESOPHAGOGASTRODUODENOSCOPY (EGD) WITH PROPOFOL N/A 01/02/2019   Procedure:  ESOPHAGOGASTRODUODENOSCOPY (EGD) WITH PROPOFOL;  Surgeon: Lucilla Lame, MD;  Location: [facility] ENDOSCOPY;  Service: Endoscopy;  Laterality: N/A;   EYE SURGERY Bilateral    Cataract Extraction with IOL   HIP ARTHROPLASTY Right 10/13/2018   Procedure: ARTHROPLASTY BIPOLAR HIP (HEMIARTHROPLASTY);  Surgeon: Earnestine Leys, MD;  Location: [facility] ORS;  Service: Orthopedics;  Laterality: Right;   IRRIGATION AND DEBRIDEMENT FOOT Left 10/15/2016   Procedure: IRRIGATION AND DEBRIDEMENT FOOT-EXCISIONAL DEBRIDEMENT OF SKIN 3RD, 4TH AND 5TH TOES WITH APPLICATION OF APLIGRAFT;  Surgeon: Albertine Patricia, DPM;  Location: [facility] ORS;  Service: Podiatry;  Laterality: Left;  necrotic,gangrene   IRRIGATION AND DEBRIDEMENT FOOT Left 12/09/2016   Procedure: IRRIGATION AND DEBRIDEMENT FOOT-MUSCLE/FASCIA, RESECTION OF FOURTH AND FIFTH METATARSAL NECROTIC BONE;  Surgeon: Albertine Patricia, DPM;  Location: [facility] ORS;  Service: Podiatry;  Laterality: Left;   IRRIGATION AND DEBRIDEMENT FOOT Right 12/23/2018   Procedure: IRRIGATION AND DEBRIDEMENT FOOT and wound vac application;  Surgeon: Samara Deist, DPM;  Location: [facility] ORS;  Service: Podiatry;  Laterality: Right;   LEFT HEART CATH N/A 06/25/2019   Procedure: Left Heart Cath and Coronary Angiography;  Surgeon: Isaias Cowman, MD;  Location: [place] CV LAB;  Service: Cardiovascular;  Laterality: N/A;   LEFT HEART CATH AND CORONARY ANGIOGRAPHY N/A 06/05/2018   Procedure: LEFT HEART CATH AND CORONARY ANGIOGRAPHY;  Surgeon: Yolonda Kida, MD;  Location: [place] CV LAB;  Service: Cardiovascular;  Laterality: N/A;   LOWER EXTREMITY ANGIOGRAPHY Left 08/16/2016   Procedure: Lower Extremity Angiography;  Surgeon: Algernon Huxley, MD;  Location: [place] CV LAB;  Service: Cardiovascular;  Laterality: Left;   LOWER EXTREMITY ANGIOGRAPHY Left 09/01/2016   Procedure: Lower Extremity Angiography;  Surgeon: Algernon Huxley, MD;  Location: [place] CV LAB;  Service:  Cardiovascular;  Laterality: Left;   LOWER EXTREMITY ANGIOGRAPHY Left 10/14/2016   Procedure: Lower Extremity Angiography;  Surgeon: Algernon Huxley, MD;  Location: [place] CV LAB;  Service: Cardiovascular;  Laterality: Left;   LOWER EXTREMITY ANGIOGRAPHY Left 12/20/2016   Procedure: Lower Extremity Angiography;  Surgeon: Algernon Huxley, MD;  Location: [place] CV LAB;  Service: Cardiovascular;  Laterality: Left;   LOWER EXTREMITY ANGIOGRAPHY Right 12/18/2018   Procedure: LOWER EXTREMITY ANGIOGRAPHY;  Surgeon: Algernon Huxley, MD;  Location: [place] CV LAB;  Service: Cardiovascular;  Laterality: Right;   LOWER EXTREMITY INTERVENTION  09/01/2016   Procedure: Lower Extremity Intervention;  Surgeon: Algernon Huxley, MD;  Location: [place] CV LAB;  Service: Cardiovascular;;   PROSTATE SURGERY     removal   TONSILLECTOMY     as a child    Medications Prior to Admission  Medication Sig Dispense Refill Last Dose   aspirin 81 MG EC tablet Take 81 mg by mouth daily.      atorvastatin (LIPITOR) 80 MG tablet Take 80 mg by mouth at bedtime.   08/13/2022 at Unknown   diltiazem (CARDIZEM CD) 120 MG 24 hr capsule Take 120 mg  by mouth daily.   08/13/2022 at Unknown   divalproex (DEPAKOTE) 500 MG DR tablet Take 500 mg by mouth daily.    08/13/2022 at Unknown   docusate sodium (COLACE) 100 MG capsule Take 1 capsule (100 mg total) by mouth 2 (two) times daily. (Patient taking differently: Take 100 mg by mouth daily.) 60 capsule 2    Ensure Max Protein (ENSURE MAX PROTEIN) LIQD Take 330 mLs (11 oz total) by mouth 2 (two) times daily.      FLUoxetine (PROZAC) 40 MG capsule Take 40 mg by mouth daily.   08/13/2022 at Unknown   Insulin Glargine (BASAGLAR KWIKPEN) 100 UNIT/ML Inject 20 Units into the skin 2 (two) times daily. (Patient taking differently: Inject 45 Units into the skin daily.)   08/13/2022 at Unknown   isosorbide mononitrate (IMDUR) 30 MG 24 hr tablet Take 60 mg by mouth daily.   08/13/2022 at Unknown    memantine (NAMENDA) 10 MG tablet Take 10 mg by mouth 2 (two) times daily.   08/13/2022 at Unknown   metoprolol succinate (TOPROL-XL) 25 MG 24 hr tablet Take 1 tablet (25 mg total) by mouth daily. 30 tablet 1 08/13/2022 at Unknown   Multiple Vitamin (MULTIVITAMIN) tablet Take 1 tablet by mouth daily.      nitroGLYCERIN (NITROSTAT) 0.4 MG SL tablet Place 1 tablet (0.4 mg total) under the tongue every 5 (five) minutes as needed for chest pain. 20 tablet 12 Unknown at PRN   NOVOLOG FLEXPEN 100 UNIT/ML FlexPen Inject 6 Units into the skin 3 (three) times daily with meals. (Patient taking differently: Inject 6 Units into the skin 2 (two) times daily with a meal.) 15 mL 0 08/13/2022 at Unknown   polycarbophil (FIBERCON) 625 MG tablet Take 625 mg by mouth daily.      Rivaroxaban (XARELTO) 15 MG TABS tablet Take 1 tablet (15 mg total) by mouth daily with supper. Hold Xarelto for 3 days.  Start on January 09, 2019. (Patient taking differently: Take 15 mg by mouth daily.) 30 tablet 1 08/13/2022 at Unknown   trimethoprim (TRIMPEX) 100 MG tablet Take 100 mg by mouth daily.   08/13/2022 at Unknown   Social History   Socioeconomic History   Marital status: Married    Spouse name: Not on file   Number of children: Not on file   Years of education: Not on file   Highest education level: Not on file  Occupational History   Occupation: retired  Tobacco Use   Smoking status: Former    Packs/day: .25    Types: Cigarettes    Quit date: 10/14/1994    Years since quitting: 27.8   Smokeless tobacco: Never  Vaping Use   Vaping Use: Never used  Substance and Sexual Activity   Alcohol use: No    Alcohol/week: 0.0 standard drinks of alcohol   Drug use: No   Sexual activity: Not on file  Other Topics Concern   Not on file  Social History Narrative   Lives at home with wife, has a wheelchair   Social Determinants of Health   Financial Resource Strain: Not on file  Food Insecurity: No Food Insecurity  (08/15/2022)   Hunger Vital Sign    Worried About Running Out of Food in the Last Year: Never true    [place] in the Last Year: Never true  Transportation Needs: No Transportation Needs (08/15/2022)   PRAPARE - Hydrologist (Medical): No  Lack of Transportation (Non-Medical): No  Physical Activity: Not on file  Stress: Not on file  Social Connections: Not on file  Intimate Partner Violence: Unknown (08/15/2022)   Humiliation, Afraid, Rape, and Kick questionnaire    Fear of Current or Ex-Partner: No    Emotionally Abused: No    Physically Abused: Not on file    Sexually Abused: No    Family History  Problem Relation Age of Onset   Hypertension Mother    Diabetes Mother    Prostate cancer Neg Hx    Bladder Cancer Neg Hx    Kidney disease Neg Hx       Intake/Output Summary (Last 24 hours) at 08/16/2022 0907 Last data filed at 08/16/2022 [id number] Gross per 24 hour  Intake 258.74 ml  Output 1700 ml  Net -1441.26 ml     Vitals:   08/16/22 0300 08/16/22 0830 08/16/22 0838 08/16/22 0903  BP: (!) 127/105 (!) 134/108 (!) 135/117   Pulse:   (!) 105   Resp: 18  18   Temp: 98.1 F (36.7 C)  97.8 F (36.6 C)   TempSrc: Axillary  Oral   SpO2:   96%   Weight:    86.2 kg    PHYSICAL EXAM General: Pleasant elderly Caucasian male, well nourished, in no acute distress.  Sitting upright in shared room in the PCU without family at bedside. HEENT:  Normocephalic and atraumatic. Neck:  No JVD.  Lungs: Normal respiratory effort on oxygen by nasal cannula.  Decreased breath sounds with trace crackles bilaterally without appreciable wheezes.  Heart: Irregularly irregular with mostly controlled rate.. Normal S1 and S2 without gallops or murmurs.  Abdomen: Mildly-distended appearing.  Msk: Normal strength and tone for age. Extremities: S/p bilateral BKA's, no peripheral edema. Neuro: Alert and oriented X 3. Psych:  Answers questions appropriately.    Labs: Basic Metabolic Panel: Recent Labs    08/14/22 0457 08/15/22 0419 08/16/22 0353  NA 141 137 138  K 4.8 4.1 4.2  CL 106 106 100  CO2 25 24 25   GLUCOSE 126* 151* 170*  BUN 46* 46* 66*  CREATININE 1.78* 1.84* 2.14*  CALCIUM 8.9 8.4* 8.9  MG 2.2 2.1  --     Liver Function Tests: Recent Labs    08/14/22 0457  AST 35  ALT 50*  ALKPHOS 110  BILITOT 0.7  PROT 7.0  ALBUMIN 3.3*    No results for input(s): "LIPASE", "AMYLASE" in the last 72 hours. CBC: Recent Labs    08/14/22 0457 08/15/22 0419  WBC 13.4* 9.0  NEUTROABS 10.6*  --   HGB 14.3 12.9*  HCT 43.9 38.4*  MCV 91.8 89.1  PLT 206 208    Cardiac Enzymes: Recent Labs    08/14/22 0719 08/14/22 0844 08/14/22 1016  TROPONINIHS 44* 47* 48*    BNP: Recent Labs    08/14/22 0457  BNP 896.7*    D-Dimer: No results for input(s): "DDIMER" in the last 72 hours. Hemoglobin A1C: No results for input(s): "HGBA1C" in the last 72 hours. Fasting Lipid Panel: No results for input(s): "CHOL", "HDL", "LDLCALC", "TRIG", "CHOLHDL", "LDLDIRECT" in the last 72 hours. Thyroid Function Tests: No results for input(s): "TSH", "T4TOTAL", "T3FREE", "THYROIDAB" in the last 72 hours.  Invalid input(s): "FREET3" Anemia Panel: No results for input(s): "VITAMINB12", "FOLATE", "FERRITIN", "TIBC", "IRON", "RETICCTPCT" in the last 72 hours.   Radiology: ECHOCARDIOGRAM COMPLETE  Result Date: 08/15/2022    ECHOCARDIOGRAM REPORT   Patient Name:   [facility]  Date of Exam: 08/15/2022 Medical Rec #:  [id number]             Height:       67.0 in Accession #:    [id number]            Weight:       179.9 lb Date of Birth:  1934/07/08            BSA:          1.933 m Patient Age:    10 years              BP:           124/97 mmHg Patient Gender: M                     HR:           102 bpm. Exam Location:  [facility] Procedure: 2D Echo and Intracardiac Opacification Agent Indications:     CHF I50.31  History:         Patient has  prior history of Echocardiogram examinations, most                  recent 12/14/2020.  Sonographer:     Kathlen Brunswick RDCS Referring Phys:  [id number] Soledad Gerlach NIU Diagnosing Phys: Isaias Cowman MD  Sonographer Comments: Suboptimal apical window. Image acquisition challenging due to patient body habitus and Image acquisition challenging due to respiratory motion. IMPRESSIONS  1. Left ventricular ejection fraction, by estimation, is 35 to 40%. The left ventricle has moderately decreased function. The left ventricle demonstrates regional wall motion abnormalities (see scoring diagram/findings for description). Left ventricular  diastolic parameters were normal.  2. Right ventricular systolic function is normal. The right ventricular size is normal.  3. Left atrial size was mild to moderately dilated.  4. The mitral valve is normal in structure. Mild to moderate mitral valve regurgitation. No evidence of mitral stenosis.  5. The aortic valve is normal in structure. Aortic valve regurgitation is not visualized. No aortic stenosis is present.  6. The inferior vena cava is normal in size with greater than 50% respiratory variability, suggesting right atrial pressure of 3 mmHg. FINDINGS  Left Ventricle: Left ventricular ejection fraction, by estimation, is 35 to 40%. The left ventricle has moderately decreased function. The left ventricle demonstrates regional wall motion abnormalities. Definity contrast agent was given IV to delineate the left ventricular endocardial borders. The left ventricular internal cavity size was normal in size. There is no left ventricular hypertrophy. Left ventricular diastolic parameters were normal.  LV Wall Scoring: The mid inferoseptal segment, apical septal segment, and apex are hypokinetic. Right Ventricle: The right ventricular size is normal. No increase in right ventricular wall thickness. Right ventricular systolic function is normal. Left Atrium: Left atrial size was mild to  moderately dilated. Right Atrium: Right atrial size was normal in size. Pericardium: There is no evidence of pericardial effusion. Mitral Valve: The mitral valve is normal in structure. Mild to moderate mitral valve regurgitation. No evidence of mitral valve stenosis. Tricuspid Valve: The tricuspid valve is normal in structure. Tricuspid valve regurgitation is mild . No evidence of tricuspid stenosis. Aortic Valve: The aortic valve is normal in structure. Aortic valve regurgitation is not visualized. No aortic stenosis is present. Aortic valve peak gradient measures 7.7 mmHg. Pulmonic Valve: The pulmonic valve was normal in structure. Pulmonic valve regurgitation is not visualized. No evidence of pulmonic stenosis. Aorta: The aortic root is normal  in size and structure. Venous: The inferior vena cava is normal in size with greater than 50% respiratory variability, suggesting right atrial pressure of 3 mmHg. IAS/Shunts: No atrial level shunt detected by color flow Doppler.  LEFT VENTRICLE PLAX 2D LVIDd:         6.60 cm     Diastology LVIDs:         5.50 cm     LV e' medial:    6.20 cm/s LV PW:         1.30 cm     LV E/e' medial:  14.0 LV IVS:        1.30 cm     LV e' lateral:   7.62 cm/s LVOT diam:     2.20 cm     LV E/e' lateral: 11.4 LV SV:         75 LV SV Index:   39 LVOT Area:     3.80 cm  LV Volumes (MOD) LV vol d, MOD A2C: 97.4 ml LV vol d, MOD A4C: 47.5 ml LV vol s, MOD A2C: 47.1 ml LV vol s, MOD A4C: 29.8 ml LV SV MOD A2C:     50.3 ml LV SV MOD A4C:     47.5 ml LV SV MOD BP:      37.4 ml RIGHT VENTRICLE RV Basal diam:  2.90 cm LEFT ATRIUM             Index LA diam:        5.10 cm 2.64 cm/m LA Vol (A2C):   85.2 ml 44.07 ml/m LA Vol (A4C):   67.9 ml 35.12 ml/m LA Biplane Vol: 78.2 ml 40.45 ml/m  AORTIC VALVE                 PULMONIC VALVE AV Area (Vmax): 2.85 cm     RVOT Peak grad: 2 mmHg AV Vmax:        139.00 cm/s AV Peak Grad:   7.7 mmHg LVOT Vmax:      104.35 cm/s LVOT Vmean:     63.200 cm/s LVOT  VTI:       0.199 m  AORTA Ao Root diam: 3.50 cm MITRAL VALVE               TRICUSPID VALVE MV Area (PHT): 5.79 cm    TV Peak grad:   29.7 mmHg MV Decel Time: 131 msec    TV Vmax:        2.72 m/s MV E velocity: 86.50 cm/s MV A velocity: 29.90 cm/s  SHUNTS MV E/A ratio:  2.89        Systemic VTI:  0.20 m                            Systemic Diam: 2.20 cm Isaias Cowman MD Electronically signed by Isaias Cowman MD Signature Date/Time: 08/15/2022/12:05:30 PM    Final    DG Chest Portable 1 View  Result Date: 08/14/2022 CLINICAL DATA:  Shortness of breath and hypoxia. EXAM: PORTABLE CHEST 1 VIEW COMPARISON:  03/06/2021. FINDINGS: The cardio pericardial silhouette is enlarged. Interstitial markings are diffusely coarsened with chronic features. Stable asymmetric elevation right hemidiaphragm. Telemetry leads overlie the chest. IMPRESSION: Chronic interstitial changes without acute cardiopulmonary findings. Electronically Signed   By: Misty Stanley M.D.   On: 08/14/2022 05:19    ECHO 12/14/2020  1. Left ventricular ejection fraction, by estimation, is 35 to 40%. The  left  ventricle has moderately decreased function. The left ventricle  demonstrates regional wall motion abnormalities (see scoring  diagram/findings for description). Left ventricular   diastolic parameters were normal.   2. Right ventricular systolic function is normal. The right ventricular  size is normal.   3. The mitral valve is normal in structure. Mild mitral valve  regurgitation. No evidence of mitral stenosis.   4. The aortic valve is normal in structure. Aortic valve regurgitation is  not visualized. No aortic stenosis is present.   5. The inferior vena cava is normal in size with greater than 50%  respiratory variability, suggesting right atrial pressure of 3 mmHg.   FINDINGS   Left Ventricle: Left ventricular ejection fraction, by estimation, is 35  to 40%. The left ventricle has moderately decreased function. The  left  ventricle demonstrates regional wall motion abnormalities. Definity  contrast agent was given IV to delineate  the left ventricular endocardial borders. The left ventricular internal  cavity size was normal in size. There is no left ventricular hypertrophy.  Left ventricular diastolic parameters were normal.     LV Wall Scoring:  The apical lateral segment, apical septal segment, and apex are  hypokinetic.   TELEMETRY reviewed by me (LT) 08/16/2022 : Atrial fibrillation rate high 90s to low 100s with short paroxysms to the 120s  EKG reviewed by me: AF RVR rate 126  Data reviewed by me (LT) 08/16/2022: Last outpatient cardiology note, ED note, admission H&P, nursing notes last 24h vitals tele labs imaging I/O   Principal Problem:   Acute on chronic systolic CHF (congestive heart failure) ([place]) Active Problems:   Hyperlipidemia   Leukocytosis   S/P bilateral BKA (below knee amputation) ([place])   Atrial fibrillation with rapid ventricular response (HCC)   Acute respiratory failure with hypoxia (HCC)   Seizure disorder (HCC)   CAD (coronary artery disease)   Chronic kidney disease, stage 3a ([place])   HTN (hypertension)   Depression   Myocardial injury   Type II diabetes mellitus with renal manifestations ([place])    ASSESSMENT AND PLAN:  Quadry A. Gaertner is an 51yoM with a PMH of paroxysmal AF (Xarelto), chronic HFrEF (35-40%, rWMAs 12/14/20), 1vCAD (CTO ostial LAD, 06/25/2019), CKD III, DM2, hx bilat BKA, who presented to [facility] ED 08/14/2022 with shortness of breath.  Cardiology is consulted for assistance with his heart failure.  # Acute on chronic HFrEF Called EMS for shortness of breath that woke him from sleep, was hypoxic to 83% on room air upon their arrival.  BNP elevated at 800 and is clinically hypervolemic on exam with abdominal distention and crackles to auscultation of his lungs.  Admits to eating mostly TV dinners lately.  Takes Lasix daily but is unsure of the dose.   Clinical improvement after initial IV diuresis, but remains with a supplemental oxygen requirement today. -Continue IV diuresis with Lasix 40 mg twice daily for now -Continue GDMT with metoprolol XL, isosorbide 60 mg daily.  Addition of ACE/ARB, MRA, SGLT2i limited by renal function. -Stressed the importance of low-salt diet, fluid restriction. -Echo complete this admission without any change compared to 2022. -No plan for additional/invasive cardiac diagnostics at this time  # Paroxysmal atrial fibrillation with RVR Rate elevated on admission requiring diltiazem infusion.  Better today on p.o. diltiazem and p.o. metoprolol in the 90s/low 100s mostly -Continue Cardizem CD 120 mg daily -Increase frequency of metoprolol XL 25 mg from once daily to twice daily dosing -Would favor decrease of Cardizem to a  stop if rate control can be achieved with uptitrating his metoprolol due to his reduced EF -Continue Xarelto for stroke risk reduction  # CKD 3 Renal function slightly worsening with IV diuresis, continue to monitor closely  # 1V CAD without chest pain # Demand ischemia Minimal high-sensitivity troponin elevation at 37, 44, 47, 48 in the absence of chest pain or EKG changes, this is most consistent with demand/supply mismatch and not ACS  -Continue aspirin 81 mg daily, and atorvastatin 80 mg daily.  This patient's plan of care was discussed and created with Dr. [place] Bigness and he is in agreement.  Signed: Tristan Schroeder , PA-C 08/16/2022, 9:07 AM [facility]

## 2022-08-16 NOTE — Consult Note (Signed)
   Heart Failure Nurse Navigator Note  HFrEF 35 to 40%.  Left ventricle with regional wall motion abnormalities.  Normal diastolic dysfunction.  Mild to moderate mitral regurgitation.  He presented to the emergency room with complaints of worsening shortness of breath, palpitations, hypoxia, unable to speak in full sentences.  BNP was 896.  Chest x-ray with chronic interstitial changes without acute findings.  Comorbidities:  Hypertension Hyperlipidemia Coronary artery disease Atrial fibrillation on NOAC Hypothyroidism Chronic kidney disease stage III Seizure Peripheral vascular disease status post BKA  Medications:  Aspirin 81 mg daily Atorvastatin 80 mg daily Diltiazem 120 mg daily Isosorbide mononitrate 60 mg daily Metoprolol tartrate 25 mg daily Xarelto 15 mg daily with supper  Labs:  Sodium 138, potassium 4.2, chloride 100, CO2 2025, BUN 66, creatinine 2.14, estimated GFR 30. Weight 86.2 kg Intake 288 mL Output 1700 mL  Initial meeting with patient, there were no family members at the bedside.  Husband states that he lives with his wife who now has Alzheimer's.  He has a son who checks on him frequently and is in the process of getting him Meals on Wheels.  The patient states the majority of their meals are frozen dinners.  Discussed eating lower sodium foods if possible.  To limit intake to 2000 mg total daily.  He states that he has a scale but he does not weigh himself.  Went over getting in the habit of weighing daily after going to the bathroom in the morning for he eats or drinks anything.  And to report 2 to 3 pound weight gain overnight or 5 pounds within the week or if he sees that his symptoms change that he is more short of breath, more edematous, PND and orthopnea.  Also discussed the importance of sticking with the 64 ounce fluid restriction and what constitutes a liquid.  He was given the living with heart failure teaching booklet, zone magnet, info on  low-sodium and heart failure along with weight chart.  He has follow-up in the outpatient heart failure clinic on April 1 at 3:30 in the afternoon.  He has a 2% no-show which is 2 out of 176 appointments.  He had no further questions and we will continue to follow along.  Pricilla Riffle RN CHFN

## 2022-08-16 NOTE — TOC Benefit Eligibility Note (Signed)
Patient Teacher, English as a foreign language completed.    The patient is currently admitted and upon discharge could be taking [place].  The current 30 day co-pay is $60.00.   The patient is insured through [employer]  There is a manufacturer coupon available that could possibly bring patient co-pay down as low as $15.00   This test claim was processed through [place] amounts may vary at other pharmacies due to pharmacy/plan contracts, or as the patient moves through the different stages of their insurance plan.

## 2022-08-16 NOTE — Progress Notes (Signed)
Progress Note   Patient: [name] [id number] DOB: [birth year] DOA: 08/14/2022     2 DOS: the patient was seen and examined on 08/16/2022   Brief hospital course:  [name] is a 88 y.o. male with medical history significant of sCHF with EF 35-40%, HTN, HLD, CAD, A fib on Xarelto, hypothyroidism, CKD-3A, seizure, prostate cancer, PVD, s/p of bilateral BKA, who presented on 08/14/2022 for evaluation of progressively worsening  shortness of breath, palpitations and hypoxia. Not on supplemental oxygen at baseline, but found to have oxygen desaturation to 83% on room air, and using accessory muscle for breathing, unable to speak in full sentence.  He was initially started on 15 L nonrebreather oxygen, then improved to 2 L oxygen with 97% of saturation.     ED evaluation - found to have BNP 896, WBC 13.4, worsening renal function, negative PCR for COVID, flu and RSV, temperature normal, blood pressure 135/96, atrial fibrillation with RVR with a heart rate 115, RR 26.  Chest x-ray showed chronic interstitial changes without infiltration.  Patient is admitted to telemetry bed as inpatient.  He was started on IV Cardizem drip for A-Fib RVR and IV Lasix for diuresis given signs of CHF decompensation likely driven by A-fib RVR.   Assessment and Plan: * Acute on chronic systolic CHF (congestive heart failure) ([place]) Echo on 12/14/2020 showed EF of 35-40%.  Patient has elevated BNP 896, positive JVD, crackles on auscultation, clinically consistent with CHF decompensation. ECHO -- EF 35-40% with LV regional WMA's, normal diastolic function, mild-mod MR Status post IV Lasix --[facility] Cardiology consulted, appreciate recs --Stop IV Lasix 40 mg BID given Cr rising --Strict I/O's, Daily Weights --Monitor renal function, electrolytes --Low sodium diet, fluid restriction  Acute respiratory failure with hypoxia (HCC) Due to CHF decompensation and A-fib RVR. 3/24 - O2 sat dropping into 80's  when trialed on room air --Mgmt of underlying issues as outlined --Supplement O2 to keep sats > 90% --Wean O2 as tolerated  Atrial fibrillation with rapid ventricular response ([place]) 3/24: HR's uncontrolled but improved, still in 110's to 120's on monitor during my encounter.  Tele reviewed.  Status post Cardizem drip on admission, stopped 3/23 AM --Continue long acting PO Cardizem 120 mg daily --Continue Toprol-XL 25 mg daily --Short acting PO Cardizem Q6H PRN for HR sustaining above 110 bpm --Telemetry monitoring --Monitor & replace K, Mg as needed   Myocardial injury See CAD  CAD (coronary artery disease) No chest pain with CHF and rapid A-fib. Minimal Troponin Elevation (37>>44>>47>>48) likely demand ischemia with rapid Afib and CHF. Seems stable, but regional WMA's noted on Echo. --Consult cardiology --Continue ASA, Lipitor, Toprol-XL, Imdur  Seizure disorder (HCC) --Seizure precaution --As needed Ativan for seizure --Continue home Keppra  HTN (hypertension) Continue Cardizem, Toprol-XL IV hydralazine PRN Also on IV Lasix  Hyperlipidemia On Lipitor   Chronic kidney disease, stage 3a ([place]) Slightly worsened than baseline.   Recent baseline Cr 1.5 on 07/27/2022.   Cr on admission 1.78 Cr trend: 1.78>>1.84>>2.14 --Stop Lasix --Monitor renal function closely --Avoid using renal toxic medications  Type II diabetes mellitus with renal manifestations (HCC) Recent A1c 8.8, poorly controlled.  Patient taking NovoLog and glargine insulin 45 units daily --Sliding scale Novolog ---Glargine insulin 20 units daily  Leukocytosis WBC 13.4, no fever, no source of infection identified, likely reactive.  UA benign. --Monitor CBC  Depression Continue home medication   S/P bilateral BKA (below knee amputation) ([place]) Due to PVD. --Fall precautions  Subjective: Pt seen awake resting in bed this AM.  He had just finished working with PT, reported some dyspnea on  exertion.  Denies chest discomfort or palpitations.  Dyspnea overall improved.   Physical Exam: Vitals:   08/16/22 0300 08/16/22 0830 08/16/22 0838 08/16/22 0903  BP: (!) 127/105 (!) 134/108 (!) 135/117   Pulse:   (!) 105   Resp: 18  18   Temp: 98.1 F (36.7 C)  97.8 F (36.6 C)   TempSrc: Axillary  Oral   SpO2:   96%   Weight:    86.2 kg   General exam: awake, alert, no acute distress HEENT: atraumatic, clear conjunctiva, anicteric sclera, moist mucus membranes, hearing grossly normal  Respiratory system: CTAB, no wheezes, rales or rhonchi, normal respiratory effort. Cardiovascular system: normal S1/S2, regular rate, irregular rhythm   Gastrointestinal system: soft, NT, ND, no HSM felt, +bowel sounds. Central nervous system: A&O x4. no gross focal neurologic deficits, normal speech Extremities: b/l BKA's Skin: dry, intact, normal temperature Psychiatry: normal mood, congruent affect, judgement and insight appear normal    Data Reviewed:  Notable labs ---   glucose 170, BUN 66, Cr 1.78>> 1.84 >> 2.14  ECHO 08/15/2022 -- EF 35-40% with LV regional WMA's, normal diastolic function, mild-mod MR  Family Communication: None present, will attempt to call daughter this afternoon   Disposition: Status is: Inpatient Remains inpatient appropriate because: ongoing evaluation and persistent A-fib RVR requrining improved HR control prior to d/c.  Cardiology consult pending.    Planned Discharge Destination: Home    Time spent: 38 minutes  Author: Ezekiel Slocumb, DO 08/16/2022 12:07 PM  For on call review [email or web address].

## 2022-08-16 NOTE — Discharge Instructions (Signed)

## 2022-08-17 ENCOUNTER — Encounter: Payer: Self-pay | Admitting: Internal Medicine

## 2022-08-17 DIAGNOSIS — I5023 Acute on chronic systolic (congestive) heart failure: Secondary | ICD-10-CM | POA: Diagnosis not present

## 2022-08-17 LAB — BASIC METABOLIC PANEL
Anion gap: 7 (ref 5–15)
BUN: 72 mg/dL — ABNORMAL HIGH (ref 8–23)
CO2: 28 mmol/L (ref 22–32)
Calcium: 8.5 mg/dL — ABNORMAL LOW (ref 8.9–10.3)
Chloride: 104 mmol/L (ref 98–111)
Creatinine, Ser: 2.07 mg/dL — ABNORMAL HIGH (ref 0.61–1.24)
GFR, Estimated: 31 mL/min — ABNORMAL LOW (ref >60)
Glucose, Bld: 147 mg/dL — ABNORMAL HIGH (ref 70–99)
Potassium: 4.2 mmol/L (ref 3.5–5.1)
Sodium: 139 mmol/L (ref 135–145)

## 2022-08-17 LAB — MAGNESIUM: Magnesium: 2.2 mg/dL (ref 1.7–2.4)

## 2022-08-17 LAB — GLUCOSE, CAPILLARY: Glucose-Capillary: 191 mg/dL — ABNORMAL HIGH (ref 70–99)

## 2022-08-17 MED ORDER — METOPROLOL TARTRATE 50 MG PO TABS
50.0000 mg | ORAL_TABLET | Freq: Two times a day (BID) | ORAL | Status: DC
  Administered 2022-08-17: 50 mg via ORAL
  Filled 2022-08-17: qty 1

## 2022-08-17 MED ORDER — FUROSEMIDE 40 MG PO TABS: 40.0000 mg | ORAL_TABLET | Freq: Every day | ORAL | 1 refills | Status: DC

## 2022-08-17 MED ORDER — METOPROLOL TARTRATE 50 MG PO TABS: 50.0000 mg | ORAL_TABLET | Freq: Two times a day (BID) | ORAL | 1 refills | Status: DC

## 2022-08-17 MED ORDER — FUROSEMIDE 40 MG PO TABS: 40.0000 mg | ORAL_TABLET | Freq: Every day | ORAL | Status: DC

## 2022-08-17 MED ORDER — METOPROLOL SUCCINATE ER 50 MG PO TB24: 50.0000 mg | ORAL_TABLET | Freq: Two times a day (BID) | ORAL | Status: DC

## 2022-08-17 MED ORDER — ISOSORBIDE MONONITRATE ER 30 MG PO TB24
30.0000 mg | ORAL_TABLET | Freq: Every day | ORAL | Status: DC
  Administered 2022-08-17: 30 mg via ORAL
  Filled 2022-08-17: qty 1

## 2022-08-17 MED ORDER — BASAGLAR KWIKPEN 100 UNIT/ML ~~LOC~~ SOPN: 25.0000 [IU] | PEN_INJECTOR | Freq: Every day | SUBCUTANEOUS | Status: DC

## 2022-08-17 NOTE — Care Management Important Message (Signed)
Important Message  Patient Details  Name: [name] MRN: [id number] Date of Birth: [birth year]   Medicare Important Message Given:  Yes     Dannette Barbara 08/17/2022, 11:27 AM

## 2022-08-17 NOTE — Progress Notes (Signed)
Discharge instructions, med list, chf teaching done with patient, SO, and son [name] who verbalizes understanding.  IV x 2 removed, tele Dc'd, male [place] Dc'd.  Pt discharged via wheelchair without incident

## 2022-08-17 NOTE — TOC Transition Note (Signed)
Transition of Care [facility]) - CM/SW Discharge Note   Patient Details  Name: [name] MRN: [id number] Date of Birth: 01-Jun-1934  Transition of Care [facility]) CM/SW Contact:  Laurena Slimmer, RN Phone Number: 08/17/2022, 11:22 AM   Clinical Narrative:    Spoke with patient's son by phone at patient's bedside. He is agreeable to [facility] and does not have a preference.  Referral sent to Feliberto Harts from [place].          Patient Goals and CMS Choice      Discharge Placement                         Discharge Plan and Services Additional resources added to the After Visit Summary for                                       Social Determinants of Health (SDOH) Interventions SDOH Screenings   Food Insecurity: No Food Insecurity (08/15/2022)  Housing: Low Risk  (08/15/2022)  Transportation Needs: No Transportation Needs (08/15/2022)  Utilities: Not At Risk (08/15/2022)  Tobacco Use: Medium Risk (08/14/2022)     Readmission Risk Interventions    03/07/2020   11:56 AM  Readmission Risk Prevention Plan  Transportation Screening Complete  PCP or Specialist Appt within 3-5 Days Complete  HRI or [place] Complete  Social Work Consult for [place] Planning/Counseling Complete  Palliative Care Screening Not Applicable  Medication Review Press photographer) Complete

## 2022-08-17 NOTE — Progress Notes (Addendum)
   Heart Failure Nurse Navigator Note   Met with patient today, he had just finished working with physical therapy.  States that he is being discharged home today.  States that he realizes that he should be walking more than he has been, states that he spends a lot of time in the wheelchair.  Went over eating lower sodium diet, not using salt, watching his fluid restriction and weighing daily.  States for seasoning that he mostly uses pepper.  Discussed using other natural spices but abstaining from anything that contains salt.  States that he does not drive and he has to rely on his son for getting to his appointments.  He is has follow-up in the outpatient heart failure clinic on April 1 at 3:30 in the afternoon.  I feel he has a fair understanding of heart failure.  Pricilla Riffle RN CHFN

## 2022-08-17 NOTE — Progress Notes (Signed)
Physical Therapy Treatment Patient Details Name: [name] MRN: [id number] DOB: February 18, 1935 Today's Date: 08/17/2022   History of Present Illness is a 88 y.o. male with medical history significant of sCHF with EF 35-40%, HTN, HLD, CAD, A fib on Xarelto, hypothyroidism, CKD-3A, seizure, prostate cancer, PVD, s/p of bilateral BKA, who presents with shortness of breath.  MD assessment includes: acute on chronic systolic CHF, acute respiratory failure with hypoxia, A-fib with RVR, myocardial injury, and leukocytosis.    PT Comments    Pt was long sitting in bed upon arrival. He is alert and agreeable to session. " I'm going home today." Pt is eager to DC home with son/spouse assist. Chief Strategy Officer assisted pt with getting dressed and applying BLE prosthetics while seated EOB. Pt then stood to RW and ambulated 15 ft in rom. HR elevated to 130bpm but quickly resolved to 105 bpm with seated rest. He endorses having w/c and ramp for home entry. Author highly recommends continued skilled PT at DC to maximize independence and activity tolerance with all ADLs.    Recommendations for follow up therapy are one component of a multi-disciplinary discharge planning process, led by the attending physician.  Recommendations may be updated based on patient status, additional functional criteria and insurance authorization.     Assistance Recommended at Discharge Frequent or constant Supervision/Assistance  Patient can return home with the following A little help with walking and/or transfers;A little help with bathing/dressing/bathroom;Assistance with cooking/housework;Help with stairs or ramp for entrance;Assist for transportation   Equipment Recommendations  None recommended by PT       Precautions / Restrictions Precautions Precautions: Fall Restrictions Weight Bearing Restrictions: No     Mobility  Bed Mobility Overal bed mobility: Needs Assistance Bed Mobility: Supine to Sit, Sit to Supine   Supine to sit: Min assist, HOB elevated (heavy use of bed functions) Sit to supine: Supervision     Transfers Overall transfer level: Needs assistance Equipment used: Rolling walker (2 wheels) Transfers: Sit to/from Stand Sit to Stand: Min guard  General transfer comment: CGA to stand however required increased time to properly place BLE/prosthetics    Ambulation/Gait Ambulation/Gait assistance: Min guard Gait Distance (Feet): 15 Feet Assistive device: Rolling walker (2 wheels) Gait Pattern/deviations: Step-through pattern, Decreased step length - right, Decreased step length - left, Trunk flexed Gait velocity: decreased  General Gait Details: pt was able to ambulate ~ 15 ft in rom. HR elevated to 128 at its peak. sao2 drops to mid 80s however por pleth. when getting god pleth reading, pt in able to maintain > 90% sao2    Balance Overall balance assessment: Needs assistance Sitting-balance support: Bilateral upper extremity supported Sitting balance-Leahy Scale: Good     Standing balance support: Bilateral upper extremity supported, During functional activity, Reliant on assistive device for balance Standing balance-Leahy Scale: Fair    Cognition Arousal/Alertness: Awake/alert Behavior During Therapy: WFL for tasks assessed/performed Overall Cognitive Status: Within Functional Limits for tasks assessed      General Comments: Pt is alert and oriented however does have some cognition deficits come to light during session. Pt endorses being caregiver for wife who he reports having dementia               Pertinent Vitals/Pain Pain Assessment Pain Assessment: No/denies pain     PT Goals (current goals can now be found in the care plan section) Acute Rehab PT Goals Patient Stated Goal: To get stronger Progress towards PT goals: Progressing toward goals  Frequency    Min 2X/week      PT Plan Current plan remains appropriate       AM-PAC PT "6 Clicks"  Mobility   Outcome Measure  Help needed turning from your back to your side while in a flat bed without using bedrails?: A Little Help needed moving from lying on your back to sitting on the side of a flat bed without using bedrails?: A Little Help needed moving to and from a bed to a chair (including a wheelchair)?: A Little Help needed standing up from a chair using your arms (e.g., wheelchair or bedside chair)?: A Little Help needed to walk in hospital room?: A Little Help needed climbing 3-5 steps with a railing? : A Lot 6 Click Score: 17    End of Session   Activity Tolerance: Patient tolerated treatment well Patient left: in bed;with call bell/phone within reach;with bed alarm set Nurse Communication: Mobility status PT Visit Diagnosis: Difficulty in walking, not elsewhere classified (R26.2);Muscle weakness (generalized) (M62.81)     Time: [id number] PT Time Calculation (min) (ACUTE ONLY): 24 min  Charges:  $Gait Training: 8-22 mins $Therapeutic Activity: 8-22 mins                     Julaine Fusi PTA 08/17/22, 10:25 AM

## 2022-08-17 NOTE — Discharge Summary (Addendum)
Physician Discharge Summary   Patient: [name] MRN: [id number] DOB: 1934-11-14  Admit date:     08/14/2022  Discharge date: 08/17/22  Discharge Physician: Ezekiel Slocumb   PCP: Latanya Maudlin, NP   Recommendations at discharge:    Follow up with Cardiology in 1-2 weeks Follow up with Primary Care in 1-2 weeks Repeat BMP, Mg, CBC in 1-2 weeks  Discharge Diagnoses: Principal Problem:   Acute on chronic systolic CHF (congestive heart failure) (HCC) Active Problems:   Atrial fibrillation with rapid ventricular response (HCC)   CAD (coronary artery disease)   Seizure disorder (HCC)   Hyperlipidemia   HTN (hypertension)   Chronic kidney disease, stage 3a ([place])   Type II diabetes mellitus with renal manifestations ([place])   Depression   S/P bilateral BKA (below knee amputation) ([place])  Resolved Problems:   Acute respiratory failure with hypoxia ([place])   Myocardial injury   Leukocytosis  Hospital Course:  [name] is a 88 y.o. male with medical history significant of sCHF with EF 35-40%, HTN, HLD, CAD, A fib on Xarelto, hypothyroidism, CKD-3A, seizure, prostate cancer, PVD, s/p of bilateral BKA, who presented on 08/14/2022 for evaluation of progressively worsening  shortness of breath, palpitations and hypoxia. Not on supplemental oxygen at baseline, but found to have oxygen desaturation to 83% on room air, and using accessory muscle for breathing, unable to speak in full sentence.  He was initially started on 15 L nonrebreather oxygen, then improved to 2 L oxygen with 97% of saturation.     ED evaluation - found to have BNP 896, WBC 13.4, worsening renal function, negative PCR for COVID, flu and RSV, temperature normal, blood pressure 135/96, atrial fibrillation with RVR with a heart rate 115, RR 26.  Chest x-ray showed chronic interstitial changes without infiltration.  Patient is admitted to telemetry bed as inpatient.  He was started on IV Cardizem drip  for A-Fib RVR and IV Lasix for diuresis given signs of CHF decompensation likely driven by A-fib RVR.   3/26 -- HR's improved.  On room air with stable O2 sats.  Stable for d/c home with [facility] PT and cardiology follow up in 1-2 weeks.   Assessment and Plan: * Acute on chronic systolic CHF (congestive heart failure) ([place]) Echo on 12/14/2020 showed EF of 35-40%.  Patient has elevated BNP 896, positive JVD, crackles on auscultation, clinically consistent with CHF decompensation. ECHO -- EF 35-40% with LV regional WMA's, normal diastolic function, mild-mod MR Status post IV Lasix --[facility] consulted, appreciate recs --3/25 Stop IV Lasix 40 mg BID given Cr rising --Discharge on Lasix 40 mg PO daily --Follow up with Cardiology in 1-2 weeks --Daily Weights at home --Monitor renal function, electrolytes --Low sodium diet, fluid restriction  Acute respiratory failure with hypoxia (HCC)-resolved as of 08/17/2022 Due to CHF decompensation and A-fib RVR. 3/24 - O2 sat dropping into 80's when trialed on room air --Mgmt of underlying issues as outlined --Supplement O2 to keep sats > 90% --Wean O2 as tolerated  Atrial fibrillation with rapid ventricular response (HCC) HR control has improved. Status post Cardizem drip on admission, stopped 3/23 AM -Continue Cardizem CD 120 mg daily -change metoprolol XL to tartrate 50mg  BID for rate control  -Would favor decrease of Cardizem to a stop if rate control can be achieved with uptitrating his metoprolol due to his reduced EF -Continue Xarelto for stroke risk reductio   CAD (coronary artery disease) No chest pain with CHF and  rapid A-fib. Minimal Troponin Elevation (37>>44>>47>>48) likely demand ischemia with rapid Afib and CHF. Seems stable, but regional WMA's noted on Echo. --Consult cardiology --Continue ASA, Lipitor, Toprol-XL, Imdur  Myocardial injury-resolved as of 08/17/2022 See CAD  Seizure disorder ([place]) --Seizure precaution --As  needed Ativan for seizure --Continue home Keppra  HTN (hypertension) Continue Cardizem, Toprol-XL Added Lasix 40 PO daily  Hyperlipidemia On Lipitor   Chronic kidney disease, stage 3a ([place]) Slightly worsened than baseline.   Recent baseline Cr 1.5 on 07/27/2022.   Cr on admission 1.78 Cr trend: 1.78>>1.84>>2.14 --Stop Lasix --Monitor renal function closely --Avoid using renal toxic medications  Type II diabetes mellitus with renal manifestations (HCC) Recent A1c 8.8, poorly controlled.  Patient taking NovoLog and glargine insulin 45 units daily --Sliding scale Novolog ---Glargine insulin 20 units daily  Leukocytosis-resolved as of 08/17/2022 WBC 13.4, no fever, no source of infection identified, likely reactive.  UA benign. --Monitor CBC  Depression Continue home medication   S/P bilateral BKA (below knee amputation) ([place]) Due to PVD. --Fall precautions   Mild AKI - due to diuresis.  Cr peaked at 2.14, improved today.  Repeat BMP at follow up in 1-2 weeks      Consultants: Cardiology Procedures performed: Echo  Disposition: Home health Diet recommendation:  Discharge Diet Orders (From admission, onward)     Start     Ordered   08/17/22 0000  Diet - low sodium heart healthy        08/17/22 1045           Cardiac diet DISCHARGE MEDICATION: Allergies as of 08/17/2022       Reactions   Contrast Media [iodinated Contrast Media] Shortness Of Breath   SOB   Iohexol Shortness Of Breath    Desc: Respiratory Distress, Laryngedema, diaphoresis, Onset Date: [id number]   Metrizamide Shortness Of Breath   SOB SOB SOB   Latex Rash        Medication List     STOP taking these medications    metoprolol succinate 25 MG 24 hr tablet Commonly known as: TOPROL-XL       TAKE these medications    aspirin EC 81 MG tablet Take 81 mg by mouth daily.   atorvastatin 80 MG tablet Commonly known as: LIPITOR Take 80 mg by mouth at bedtime.   Basaglar KwikPen  100 UNIT/ML Inject 25 Units into the skin daily. (Resume usual home dose if sugars are elevated) What changed:  how much to take when to take this additional instructions   diltiazem 120 MG 24 hr capsule Commonly known as: CARDIZEM CD Take 120 mg by mouth daily.   divalproex 500 MG DR tablet Commonly known as: DEPAKOTE Take 500 mg by mouth daily.   docusate sodium 100 MG capsule Commonly known as: Colace Take 1 capsule (100 mg total) by mouth 2 (two) times daily. What changed: when to take this   Ensure Max Protein Liqd Take 330 mLs (11 oz total) by mouth 2 (two) times daily.   FLUoxetine 40 MG capsule Commonly known as: PROZAC Take 40 mg by mouth daily.   furosemide 40 MG tablet Commonly known as: LASIX Take 1 tablet (40 mg total) by mouth daily. Start taking on: August 18, 2022   isosorbide mononitrate 30 MG 24 hr tablet Commonly known as: IMDUR Take 60 mg by mouth daily.   memantine 10 MG tablet Commonly known as: NAMENDA Take 10 mg by mouth 2 (two) times daily.   metoprolol tartrate 50 MG  tablet Commonly known as: LOPRESSOR Take 1 tablet (50 mg total) by mouth 2 (two) times daily.   multivitamin tablet Take 1 tablet by mouth daily.   nitroGLYCERIN 0.4 MG SL tablet Commonly known as: NITROSTAT Place 1 tablet (0.4 mg total) under the tongue every 5 (five) minutes as needed for chest pain.   NovoLOG FlexPen 100 UNIT/ML FlexPen Generic drug: insulin aspart Inject 6 Units into the skin 3 (three) times daily with meals. What changed: when to take this   polycarbophil 625 MG tablet Commonly known as: FIBERCON Take 625 mg by mouth daily.   Rivaroxaban 15 MG Tabs tablet Commonly known as: XARELTO Take 1 tablet (15 mg total) by mouth daily with supper. Hold Xarelto for 3 days.  Start on January 09, 2019. What changed:  when to take this additional instructions   trimethoprim 100 MG tablet Commonly known as: TRIMPEX Take 100 mg by mouth daily.         Follow-up Information     Scheryl Marten, PA-C. Go in 1 week(s).   Specialty: Cardiology Why: Appointment on Tuesday, 08/31/2022 at 12:45pm with Lauren. Contact information: [place] [address] [phone]                Discharge Exam: Danley Danker Weights   08/15/22 1730 08/16/22 0903 08/17/22 0531  Weight: 85.4 kg 86.2 kg 87.6 kg   General exam: awake, alert, no acute distress HEENT: atraumatic, clear conjunctiva, anicteric sclera, moist mucus membranes, hearing grossly normal  Respiratory system: CTAB, no wheezes, rales or rhonchi, normal respiratory effort. Cardiovascular system: normal S1/S2, irregular rhythm, regular rate  Gastrointestinal system: soft, NT, ND, no HSM felt, +bowel sounds. Central nervous system: A&O x3. no gross focal neurologic deficits, normal speech Extremities: bilateral BKA's, no edema, normal tone Skin: dry, intact, normal temperature Psychiatry: normal mood, congruent affect, judgement and insight appear normal   Condition at discharge: stable  The results of significant diagnostics from this hospitalization (including imaging, microbiology, ancillary and laboratory) are listed below for reference.   Imaging Studies: ECHOCARDIOGRAM COMPLETE  Result Date: 08/15/2022    ECHOCARDIOGRAM REPORT   Patient Name:   [name] Date of Exam: 08/15/2022 Medical Rec #:  [id number]             Height:       67.0 in Accession #:    [id number]            Weight:       179.9 lb Date of Birth:  [birth year]            BSA:          1.933 m Patient Age:    66 years              BP:           124/97 mmHg Patient Gender: M                     HR:           102 bpm. Exam Location:  [facility] Procedure: 2D Echo and Intracardiac Opacification Agent Indications:     CHF I50.31  History:         Patient has prior history of Echocardiogram examinations, most                  recent 12/14/2020.  Sonographer:     Kathlen Brunswick RDCS Referring Phys:   [id number] Ivor Costa Diagnosing  Phys: Isaias Cowman MD  Sonographer Comments: Suboptimal apical window. Image acquisition challenging due to patient body habitus and Image acquisition challenging due to respiratory motion. IMPRESSIONS  1. Left ventricular ejection fraction, by estimation, is 35 to 40%. The left ventricle has moderately decreased function. The left ventricle demonstrates regional wall motion abnormalities (see scoring diagram/findings for description). Left ventricular  diastolic parameters were normal.  2. Right ventricular systolic function is normal. The right ventricular size is normal.  3. Left atrial size was mild to moderately dilated.  4. The mitral valve is normal in structure. Mild to moderate mitral valve regurgitation. No evidence of mitral stenosis.  5. The aortic valve is normal in structure. Aortic valve regurgitation is not visualized. No aortic stenosis is present.  6. The inferior vena cava is normal in size with greater than 50% respiratory variability, suggesting right atrial pressure of 3 mmHg. FINDINGS  Left Ventricle: Left ventricular ejection fraction, by estimation, is 35 to 40%. The left ventricle has moderately decreased function. The left ventricle demonstrates regional wall motion abnormalities. Definity contrast agent was given IV to delineate the left ventricular endocardial borders. The left ventricular internal cavity size was normal in size. There is no left ventricular hypertrophy. Left ventricular diastolic parameters were normal.  LV Wall Scoring: The mid inferoseptal segment, apical septal segment, and apex are hypokinetic. Right Ventricle: The right ventricular size is normal. No increase in right ventricular wall thickness. Right ventricular systolic function is normal. Left Atrium: Left atrial size was mild to moderately dilated. Right Atrium: Right atrial size was normal in size. Pericardium: There is no evidence of pericardial effusion. Mitral Valve: The  mitral valve is normal in structure. Mild to moderate mitral valve regurgitation. No evidence of mitral valve stenosis. Tricuspid Valve: The tricuspid valve is normal in structure. Tricuspid valve regurgitation is mild . No evidence of tricuspid stenosis. Aortic Valve: The aortic valve is normal in structure. Aortic valve regurgitation is not visualized. No aortic stenosis is present. Aortic valve peak gradient measures 7.7 mmHg. Pulmonic Valve: The pulmonic valve was normal in structure. Pulmonic valve regurgitation is not visualized. No evidence of pulmonic stenosis. Aorta: The aortic root is normal in size and structure. Venous: The inferior vena cava is normal in size with greater than 50% respiratory variability, suggesting right atrial pressure of 3 mmHg. IAS/Shunts: No atrial level shunt detected by color flow Doppler.  LEFT VENTRICLE PLAX 2D LVIDd:         6.60 cm     Diastology LVIDs:         5.50 cm     LV e' medial:    6.20 cm/s LV PW:         1.30 cm     LV E/e' medial:  14.0 LV IVS:        1.30 cm     LV e' lateral:   7.62 cm/s LVOT diam:     2.20 cm     LV E/e' lateral: 11.4 LV SV:         75 LV SV Index:   39 LVOT Area:     3.80 cm  LV Volumes (MOD) LV vol d, MOD A2C: 97.4 ml LV vol d, MOD A4C: 47.5 ml LV vol s, MOD A2C: 47.1 ml LV vol s, MOD A4C: 29.8 ml LV SV MOD A2C:     50.3 ml LV SV MOD A4C:     47.5 ml LV SV MOD BP:  37.4 ml RIGHT VENTRICLE RV Basal diam:  2.90 cm LEFT ATRIUM             Index LA diam:        5.10 cm 2.64 cm/m LA Vol (A2C):   85.2 ml 44.07 ml/m LA Vol (A4C):   67.9 ml 35.12 ml/m LA Biplane Vol: 78.2 ml 40.45 ml/m  AORTIC VALVE                 PULMONIC VALVE AV Area (Vmax): 2.85 cm     RVOT Peak grad: 2 mmHg AV Vmax:        139.00 cm/s AV Peak Grad:   7.7 mmHg LVOT Vmax:      104.35 cm/s LVOT Vmean:     63.200 cm/s LVOT VTI:       0.199 m  AORTA Ao Root diam: 3.50 cm MITRAL VALVE               TRICUSPID VALVE MV Area (PHT): 5.79 cm    TV Peak grad:   29.7 mmHg MV Decel  Time: 131 msec    TV Vmax:        2.72 m/s MV E velocity: 86.50 cm/s MV A velocity: 29.90 cm/s  SHUNTS MV E/A ratio:  2.89        Systemic VTI:  0.20 m                            Systemic Diam: 2.20 cm Isaias Cowman MD Electronically signed by Isaias Cowman MD Signature Date/Time: 08/15/2022/12:05:30 PM    Final    DG Chest Portable 1 View  Result Date: 08/14/2022 CLINICAL DATA:  Shortness of breath and hypoxia. EXAM: PORTABLE CHEST 1 VIEW COMPARISON:  03/06/2021. FINDINGS: The cardio pericardial silhouette is enlarged. Interstitial markings are diffusely coarsened with chronic features. Stable asymmetric elevation right hemidiaphragm. Telemetry leads overlie the chest. IMPRESSION: Chronic interstitial changes without acute cardiopulmonary findings. Electronically Signed   By: Misty Stanley M.D.   On: 08/14/2022 05:19    Microbiology: Results for orders placed or performed during the hospital encounter of 08/14/22  Resp panel by RT-PCR (RSV, Flu A&B, Covid) Anterior Nasal Swab     Status: None   Collection Time: 08/14/22  6:27 AM   Specimen: Anterior Nasal Swab  Result Value Ref Range Status   SARS Coronavirus 2 by RT PCR NEGATIVE NEGATIVE Final    Comment: (NOTE) SARS-CoV-2 target nucleic acids are NOT DETECTED.  The SARS-CoV-2 RNA is generally detectable in upper respiratory specimens during the acute phase of infection. The lowest concentration of SARS-CoV-2 viral copies this assay can detect is 138 copies/mL. A negative result does not preclude SARS-Cov-2 infection and should not be used as the sole basis for treatment or other patient management decisions. A negative result may occur with  improper specimen collection/handling, submission of specimen other than nasopharyngeal swab, presence of viral mutation(s) within the areas targeted by this assay, and inadequate number of viral copies(<138 copies/mL). A negative result must be combined with clinical observations, patient  history, and epidemiological information. The expected result is Negative.  Fact Sheet for Patients:  EntrepreneurPulse.com.au  Fact Sheet for Healthcare Providers:  IncredibleEmployment.be  This test is no t yet approved or cleared by the [place] FDA and  has been authorized for detection and/or diagnosis of SARS-CoV-2 by FDA under an Emergency Use Authorization (EUA). This EUA will remain  in effect (meaning this  test can be used) for the duration of the COVID-19 declaration under Section 564(b)(1) of the Act, 21 U.S.C.section 360bbb-3(b)(1), unless the authorization is terminated  or revoked sooner.       Influenza A by PCR NEGATIVE NEGATIVE Final   Influenza B by PCR NEGATIVE NEGATIVE Final    Comment: (NOTE) The Xpert Xpress SARS-CoV-2/FLU/RSV plus assay is intended as an aid in the diagnosis of influenza from Nasopharyngeal swab specimens and should not be used as a sole basis for treatment. Nasal washings and aspirates are unacceptable for Xpert Xpress SARS-CoV-2/FLU/RSV testing.  Fact Sheet for Patients: EntrepreneurPulse.com.au  Fact Sheet for Healthcare Providers: IncredibleEmployment.be  This test is not yet approved or cleared by the [place] FDA and has been authorized for detection and/or diagnosis of SARS-CoV-2 by FDA under an Emergency Use Authorization (EUA). This EUA will remain in effect (meaning this test can be used) for the duration of the COVID-19 declaration under Section 564(b)(1) of the Act, 21 U.S.C. section 360bbb-3(b)(1), unless the authorization is terminated or revoked.     Resp Syncytial Virus by PCR NEGATIVE NEGATIVE Final    Comment: (NOTE) Fact Sheet for Patients: EntrepreneurPulse.com.au  Fact Sheet for Healthcare Providers: IncredibleEmployment.be  This test is not yet approved or cleared by the [place] FDA  and has been authorized for detection and/or diagnosis of SARS-CoV-2 by FDA under an Emergency Use Authorization (EUA). This EUA will remain in effect (meaning this test can be used) for the duration of the COVID-19 declaration under Section 564(b)(1) of the Act, 21 U.S.C. section 360bbb-3(b)(1), unless the authorization is terminated or revoked.  Performed at [facility], [address] [place], [place] [address]     Labs: CBC: Recent Labs  Lab 08/14/22 0457 08/15/22 0419  WBC 13.4* 9.0  NEUTROABS 10.6*  --   HGB 14.3 12.9*  HCT 43.9 38.4*  MCV 91.8 89.1  PLT 206 [id number]   Basic Metabolic Panel: Recent Labs  Lab 08/14/22 0457 08/15/22 0419 08/16/22 0353 08/17/22 0441  NA 141 137 138 139  K 4.8 4.1 4.2 4.2  CL 106 106 100 104  CO2 25 24 25 28   GLUCOSE 126* 151* 170* 147*  BUN 46* 46* 66* 72*  CREATININE 1.78* 1.84* 2.14* 2.07*  CALCIUM 8.9 8.4* 8.9 8.5*  MG 2.2 2.1  --  2.2   Liver Function Tests: Recent Labs  Lab 08/14/22 0457  AST 35  ALT 50*  ALKPHOS 110  BILITOT 0.7  PROT 7.0  ALBUMIN 3.3*   CBG: Recent Labs  Lab 08/16/22 0840 08/16/22 1246 08/16/22 1627 08/16/22 2006 08/17/22 0913  GLUCAP 135* 270* 141* 225* 191*    Discharge time spent: less than 30 minutes.  Signed: Ezekiel Slocumb, DO [facility] 08/17/2022

## 2022-08-17 NOTE — Progress Notes (Signed)
[name]       Patient ID: [name] MRN: [id number] DOB/AGE: 1934/11/21 88 y.o.  Admit date: 08/14/2022 Referring Physician Dr. Nicole Kindred Primary Physician Thornton Dales, NP Primary Cardiologist Dr. Nehemiah Massed Reason for Consultation AF RVR, AoCHF, abnormal echo   HPI: [name] is an 46yoM with a PMH of paroxysmal AF (Xarelto), chronic HFrEF (35-40%, rWMAs 12/14/20), 1vCAD (CTO ostial LAD, 06/25/2019), CKD III, DM2, hx bilat BKA, who presented to [facility] ED 08/14/2022 with shortness of breath.  Echo this admission unchanged from prior. Clinically improved after IV diuresis.   Interval History:  - weaned to room air - net neg 3.4L. renal function improved after holding yesterday PM lasix  - in AF on tele, rate controlled in the 90s- low 100s - no chest pain, shortness of breath, palpitations  Review of systems complete and found to be negative unless listed above     Past Medical History:  Diagnosis Date   Arthritis    Atrial fibrillation ([place])    Carcinoma of prostate (HCC)    CHF (congestive heart failure) (HCC)    Chronic kidney disease    Diabetes mellitus without complication [facility])    ED (erectile dysfunction)    Frequent urination    Hyperlipidemia    Hypertension    Moderate mitral insufficiency    Peripheral vascular disease ([place])    Pneumonia 04/2016   Prostate cancer ([place])    Sleep apnea    OSA--USE C-PAP   Ulcer of left foot due to type 2 diabetes mellitus ([place])    Urinary stress incontinence, male     Past Surgical History:  Procedure Laterality Date   AMPUTATION Left 01/12/2017   Procedure: AMPUTATION BELOW KNEE;  Surgeon: Algernon Huxley, MD;  Location: [facility] ORS;  Service: General;  Laterality: Left;   AMPUTATION Right 12/27/2018   Procedure: AMPUTATION BELOW KNEE;  Surgeon: Algernon Huxley, MD;  Location: [facility] ORS;  Service: General;  Laterality: Right;   APLIGRAFT PLACEMENT Left 10/15/2016   Procedure:  APLIGRAFT PLACEMENT;  Surgeon: Albertine Patricia, DPM;  Location: [facility] ORS;  Service: Podiatry;  Laterality: Left;  necrotic ulcer   CHOLECYSTECTOMY     COLONOSCOPY WITH PROPOFOL N/A 01/02/2019   Procedure: COLONOSCOPY WITH PROPOFOL;  Surgeon: Lucilla Lame, MD;  Location: [facility] ENDOSCOPY;  Service: Endoscopy;  Laterality: N/A;   COLONOSCOPY WITH PROPOFOL N/A 01/05/2019   Procedure: COLONOSCOPY WITH PROPOFOL;  Surgeon: Lucilla Lame, MD;  Location: [facility] ENDOSCOPY;  Service: Endoscopy;  Laterality: N/A;   ESOPHAGOGASTRODUODENOSCOPY (EGD) WITH PROPOFOL N/A 01/02/2019   Procedure: ESOPHAGOGASTRODUODENOSCOPY (EGD) WITH PROPOFOL;  Surgeon: Lucilla Lame, MD;  Location: [facility] ENDOSCOPY;  Service: Endoscopy;  Laterality: N/A;   EYE SURGERY Bilateral    Cataract Extraction with IOL   HIP ARTHROPLASTY Right 10/13/2018   Procedure: ARTHROPLASTY BIPOLAR HIP (HEMIARTHROPLASTY);  Surgeon: Earnestine Leys, MD;  Location: [facility] ORS;  Service: Orthopedics;  Laterality: Right;   IRRIGATION AND DEBRIDEMENT FOOT Left 10/15/2016   Procedure: IRRIGATION AND DEBRIDEMENT FOOT-EXCISIONAL DEBRIDEMENT OF SKIN 3RD, 4TH AND 5TH TOES WITH APPLICATION OF APLIGRAFT;  Surgeon: Albertine Patricia, DPM;  Location: [facility] ORS;  Service: Podiatry;  Laterality: Left;  necrotic,gangrene   IRRIGATION AND DEBRIDEMENT FOOT Left 12/09/2016   Procedure: IRRIGATION AND DEBRIDEMENT FOOT-MUSCLE/FASCIA, RESECTION OF FOURTH AND FIFTH METATARSAL NECROTIC BONE;  Surgeon: Albertine Patricia, DPM;  Location: [facility] ORS;  Service: Podiatry;  Laterality: Left;   IRRIGATION AND DEBRIDEMENT FOOT Right 12/23/2018   Procedure: IRRIGATION AND DEBRIDEMENT  FOOT and wound vac application;  Surgeon: Samara Deist, DPM;  Location: [facility] ORS;  Service: Podiatry;  Laterality: Right;   LEFT HEART CATH N/A 06/25/2019   Procedure: Left Heart Cath and Coronary Angiography;  Surgeon: Isaias Cowman, MD;  Location: [place] CV LAB;  Service: Cardiovascular;  Laterality: N/A;   LEFT  HEART CATH AND CORONARY ANGIOGRAPHY N/A 06/05/2018   Procedure: LEFT HEART CATH AND CORONARY ANGIOGRAPHY;  Surgeon: Yolonda Kida, MD;  Location: [place] CV LAB;  Service: Cardiovascular;  Laterality: N/A;   LOWER EXTREMITY ANGIOGRAPHY Left 08/16/2016   Procedure: Lower Extremity Angiography;  Surgeon: Algernon Huxley, MD;  Location: [place] CV LAB;  Service: Cardiovascular;  Laterality: Left;   LOWER EXTREMITY ANGIOGRAPHY Left 09/01/2016   Procedure: Lower Extremity Angiography;  Surgeon: Algernon Huxley, MD;  Location: [place] CV LAB;  Service: Cardiovascular;  Laterality: Left;   LOWER EXTREMITY ANGIOGRAPHY Left 10/14/2016   Procedure: Lower Extremity Angiography;  Surgeon: Algernon Huxley, MD;  Location: [place] CV LAB;  Service: Cardiovascular;  Laterality: Left;   LOWER EXTREMITY ANGIOGRAPHY Left 12/20/2016   Procedure: Lower Extremity Angiography;  Surgeon: Algernon Huxley, MD;  Location: [place] CV LAB;  Service: Cardiovascular;  Laterality: Left;   LOWER EXTREMITY ANGIOGRAPHY Right 12/18/2018   Procedure: LOWER EXTREMITY ANGIOGRAPHY;  Surgeon: Algernon Huxley, MD;  Location: [place] CV LAB;  Service: Cardiovascular;  Laterality: Right;   LOWER EXTREMITY INTERVENTION  09/01/2016   Procedure: Lower Extremity Intervention;  Surgeon: Algernon Huxley, MD;  Location: [place] CV LAB;  Service: Cardiovascular;;   PROSTATE SURGERY     removal   TONSILLECTOMY     as a child    Medications Prior to Admission  Medication Sig Dispense Refill Last Dose   aspirin 81 MG EC tablet Take 81 mg by mouth daily.      atorvastatin (LIPITOR) 80 MG tablet Take 80 mg by mouth at bedtime.   08/13/2022 at Unknown   diltiazem (CARDIZEM CD) 120 MG 24 hr capsule Take 120 mg by mouth daily.   08/13/2022 at Unknown   divalproex (DEPAKOTE) 500 MG DR tablet Take 500 mg by mouth daily.    08/13/2022 at Unknown   docusate sodium (COLACE) 100 MG capsule Take 1 capsule (100 mg total) by mouth 2 (two)  times daily. (Patient taking differently: Take 100 mg by mouth daily.) 60 capsule 2    Ensure Max Protein (ENSURE MAX PROTEIN) LIQD Take 330 mLs (11 oz total) by mouth 2 (two) times daily.      FLUoxetine (PROZAC) 40 MG capsule Take 40 mg by mouth daily.   08/13/2022 at Unknown   Insulin Glargine (BASAGLAR KWIKPEN) 100 UNIT/ML Inject 20 Units into the skin 2 (two) times daily. (Patient taking differently: Inject 45 Units into the skin daily.)   08/13/2022 at Unknown   isosorbide mononitrate (IMDUR) 30 MG 24 hr tablet Take 60 mg by mouth daily.   08/13/2022 at Unknown   memantine (NAMENDA) 10 MG tablet Take 10 mg by mouth 2 (two) times daily.   08/13/2022 at Unknown   metoprolol succinate (TOPROL-XL) 25 MG 24 hr tablet Take 1 tablet (25 mg total) by mouth daily. 30 tablet 1 08/13/2022 at Unknown   Multiple Vitamin (MULTIVITAMIN) tablet Take 1 tablet by mouth daily.      nitroGLYCERIN (NITROSTAT) 0.4 MG SL tablet Place 1 tablet (0.4 mg total) under the tongue every 5 (five) minutes as needed for chest pain.  20 tablet 12 Unknown at PRN   NOVOLOG FLEXPEN 100 UNIT/ML FlexPen Inject 6 Units into the skin 3 (three) times daily with meals. (Patient taking differently: Inject 6 Units into the skin 2 (two) times daily with a meal.) 15 mL 0 08/13/2022 at Unknown   polycarbophil (FIBERCON) 625 MG tablet Take 625 mg by mouth daily.      Rivaroxaban (XARELTO) 15 MG TABS tablet Take 1 tablet (15 mg total) by mouth daily with supper. Hold Xarelto for 3 days.  Start on January 09, 2019. (Patient taking differently: Take 15 mg by mouth daily.) 30 tablet 1 08/13/2022 at Unknown   trimethoprim (TRIMPEX) 100 MG tablet Take 100 mg by mouth daily.   08/13/2022 at Unknown   Social History   Socioeconomic History   Marital status: Married    Spouse name: Not on file   Number of children: Not on file   Years of education: Not on file   Highest education level: Not on file  Occupational History   Occupation: retired  Tobacco  Use   Smoking status: Former    Packs/day: .25    Types: Cigarettes    Quit date: 10/14/1994    Years since quitting: 27.8   Smokeless tobacco: Never  Vaping Use   Vaping Use: Never used  Substance and Sexual Activity   Alcohol use: No    Alcohol/week: 0.0 standard drinks of alcohol   Drug use: No   Sexual activity: Not on file  Other Topics Concern   Not on file  Social History Narrative   Lives at home with wife, has a wheelchair   Social Determinants of Health   Financial Resource Strain: Not on file  Food Insecurity: No Food Insecurity (08/15/2022)   Hunger Vital Sign    Worried About Running Out of Food in the Last Year: Never true    [place] in the Last Year: Never true  Transportation Needs: No Transportation Needs (08/15/2022)   PRAPARE - Hydrologist (Medical): No    Lack of Transportation (Non-Medical): No  Physical Activity: Not on file  Stress: Not on file  Social Connections: Not on file  Intimate Partner Violence: Unknown (08/15/2022)   Humiliation, Afraid, Rape, and Kick questionnaire    Fear of Current or Ex-Partner: No    Emotionally Abused: No    Physically Abused: Not on file    Sexually Abused: No    Family History  Problem Relation Age of Onset   Hypertension Mother    Diabetes Mother    Prostate cancer Neg Hx    Bladder Cancer Neg Hx    Kidney disease Neg Hx       Intake/Output Summary (Last 24 hours) at 08/17/2022 0800 Last data filed at 08/17/2022 0726 Gross per 24 hour  Intake 480 ml  Output 1501 ml  Net -1021 ml     Vitals:   08/17/22 0014 08/17/22 0512 08/17/22 0531 08/17/22 0534  BP: 136/88 120/82    Pulse: (!) 55 (!) 43    Resp: 18 20    Temp: 97.7 F (36.5 C) (!) 97.4 F (36.3 C)    TempSrc:      SpO2: 93% 91%  96%  Weight:   87.6 kg     PHYSICAL EXAM General: Pleasant elderly Caucasian male, well nourished, in no acute distress.  Sitting upright in shared room in the PCU eating  breakfast HEENT:  Normocephalic and atraumatic. Neck:  No  JVD.  Lungs: Normal respiratory effort on oxygen by nasal cannula.  Decreased breath sounds with trace crackles bilaterally without appreciable wheezes.  Heart: Irregularly irregular with controlled rate.. Normal S1 and S2 without gallops or murmurs.  Abdomen: Mildly-distended appearing.  Msk: Normal strength and tone for age. Extremities: S/p bilateral BKA's, no peripheral edema. Neuro: Alert and oriented X 3. Psych:  Answers questions appropriately.   Labs: Basic Metabolic Panel: Recent Labs    08/15/22 0419 08/16/22 0353 08/17/22 0441  NA 137 138 139  K 4.1 4.2 4.2  CL 106 100 104  CO2 24 25 28   GLUCOSE 151* 170* 147*  BUN 46* 66* 72*  CREATININE 1.84* 2.14* 2.07*  CALCIUM 8.4* 8.9 8.5*  MG 2.1  --  2.2    Liver Function Tests: No results for input(s): "AST", "ALT", "ALKPHOS", "BILITOT", "PROT", "ALBUMIN" in the last 72 hours.  No results for input(s): "LIPASE", "AMYLASE" in the last 72 hours. CBC: Recent Labs    08/15/22 0419  WBC 9.0  HGB 12.9*  HCT 38.4*  MCV 89.1  PLT 208    Cardiac Enzymes: Recent Labs    08/14/22 0844 08/14/22 1016  TROPONINIHS 47* 48*    BNP: No results for input(s): "BNP" in the last 72 hours.  D-Dimer: No results for input(s): "DDIMER" in the last 72 hours. Hemoglobin A1C: No results for input(s): "HGBA1C" in the last 72 hours. Fasting Lipid Panel: No results for input(s): "CHOL", "HDL", "LDLCALC", "TRIG", "CHOLHDL", "LDLDIRECT" in the last 72 hours. Thyroid Function Tests: No results for input(s): "TSH", "T4TOTAL", "T3FREE", "THYROIDAB" in the last 72 hours.  Invalid input(s): "FREET3" Anemia Panel: No results for input(s): "VITAMINB12", "FOLATE", "FERRITIN", "TIBC", "IRON", "RETICCTPCT" in the last 72 hours.   Radiology: ECHOCARDIOGRAM COMPLETE  Result Date: 08/15/2022    ECHOCARDIOGRAM REPORT   Patient Name:   DERRIE CAMINITI Date of Exam: 08/15/2022  Medical Rec #:  [id number]             Height:       67.0 in Accession #:    [id number]            Weight:       179.9 lb Date of Birth:  1934/12/28            BSA:          1.933 m Patient Age:    37 years              BP:           124/97 mmHg Patient Gender: M                     HR:           102 bpm. Exam Location:  [facility] Procedure: 2D Echo and Intracardiac Opacification Agent Indications:     CHF I50.31  History:         Patient has prior history of Echocardiogram examinations, most                  recent 12/14/2020.  Sonographer:     Kathlen Brunswick RDCS Referring Phys:  [id number] Soledad Gerlach NIU Diagnosing Phys: Isaias Cowman MD  Sonographer Comments: Suboptimal apical window. Image acquisition challenging due to patient body habitus and Image acquisition challenging due to respiratory motion. IMPRESSIONS  1. Left ventricular ejection fraction, by estimation, is 35 to 40%. The left ventricle has moderately decreased function. The left ventricle demonstrates regional wall motion abnormalities (  see scoring diagram/findings for description). Left ventricular  diastolic parameters were normal.  2. Right ventricular systolic function is normal. The right ventricular size is normal.  3. Left atrial size was mild to moderately dilated.  4. The mitral valve is normal in structure. Mild to moderate mitral valve regurgitation. No evidence of mitral stenosis.  5. The aortic valve is normal in structure. Aortic valve regurgitation is not visualized. No aortic stenosis is present.  6. The inferior vena cava is normal in size with greater than 50% respiratory variability, suggesting right atrial pressure of 3 mmHg. FINDINGS  Left Ventricle: Left ventricular ejection fraction, by estimation, is 35 to 40%. The left ventricle has moderately decreased function. The left ventricle demonstrates regional wall motion abnormalities. Definity contrast agent was given IV to delineate the left ventricular endocardial borders. The left  ventricular internal cavity size was normal in size. There is no left ventricular hypertrophy. Left ventricular diastolic parameters were normal.  LV Wall Scoring: The mid inferoseptal segment, apical septal segment, and apex are hypokinetic. Right Ventricle: The right ventricular size is normal. No increase in right ventricular wall thickness. Right ventricular systolic function is normal. Left Atrium: Left atrial size was mild to moderately dilated. Right Atrium: Right atrial size was normal in size. Pericardium: There is no evidence of pericardial effusion. Mitral Valve: The mitral valve is normal in structure. Mild to moderate mitral valve regurgitation. No evidence of mitral valve stenosis. Tricuspid Valve: The tricuspid valve is normal in structure. Tricuspid valve regurgitation is mild . No evidence of tricuspid stenosis. Aortic Valve: The aortic valve is normal in structure. Aortic valve regurgitation is not visualized. No aortic stenosis is present. Aortic valve peak gradient measures 7.7 mmHg. Pulmonic Valve: The pulmonic valve was normal in structure. Pulmonic valve regurgitation is not visualized. No evidence of pulmonic stenosis. Aorta: The aortic root is normal in size and structure. Venous: The inferior vena cava is normal in size with greater than 50% respiratory variability, suggesting right atrial pressure of 3 mmHg. IAS/Shunts: No atrial level shunt detected by color flow Doppler.  LEFT VENTRICLE PLAX 2D LVIDd:         6.60 cm     Diastology LVIDs:         5.50 cm     LV e' medial:    6.20 cm/s LV PW:         1.30 cm     LV E/e' medial:  14.0 LV IVS:        1.30 cm     LV e' lateral:   7.62 cm/s LVOT diam:     2.20 cm     LV E/e' lateral: 11.4 LV SV:         75 LV SV Index:   39 LVOT Area:     3.80 cm  LV Volumes (MOD) LV vol d, MOD A2C: 97.4 ml LV vol d, MOD A4C: 47.5 ml LV vol s, MOD A2C: 47.1 ml LV vol s, MOD A4C: 29.8 ml LV SV MOD A2C:     50.3 ml LV SV MOD A4C:     47.5 ml LV SV MOD BP:       37.4 ml RIGHT VENTRICLE RV Basal diam:  2.90 cm LEFT ATRIUM             Index LA diam:        5.10 cm 2.64 cm/m LA Vol (A2C):   85.2 ml 44.07 ml/m LA Vol (A4C):   67.9  ml 35.12 ml/m LA Biplane Vol: 78.2 ml 40.45 ml/m  AORTIC VALVE                 PULMONIC VALVE AV Area (Vmax): 2.85 cm     RVOT Peak grad: 2 mmHg AV Vmax:        139.00 cm/s AV Peak Grad:   7.7 mmHg LVOT Vmax:      104.35 cm/s LVOT Vmean:     63.200 cm/s LVOT VTI:       0.199 m  AORTA Ao Root diam: 3.50 cm MITRAL VALVE               TRICUSPID VALVE MV Area (PHT): 5.79 cm    TV Peak grad:   29.7 mmHg MV Decel Time: 131 msec    TV Vmax:        2.72 m/s MV E velocity: 86.50 cm/s MV A velocity: 29.90 cm/s  SHUNTS MV E/A ratio:  2.89        Systemic VTI:  0.20 m                            Systemic Diam: 2.20 cm Isaias Cowman MD Electronically signed by Isaias Cowman MD Signature Date/Time: 08/15/2022/12:05:30 PM    Final    DG Chest Portable 1 View  Result Date: 08/14/2022 CLINICAL DATA:  Shortness of breath and hypoxia. EXAM: PORTABLE CHEST 1 VIEW COMPARISON:  03/06/2021. FINDINGS: The cardio pericardial silhouette is enlarged. Interstitial markings are diffusely coarsened with chronic features. Stable asymmetric elevation right hemidiaphragm. Telemetry leads overlie the chest. IMPRESSION: Chronic interstitial changes without acute cardiopulmonary findings. Electronically Signed   By: Misty Stanley M.D.   On: 08/14/2022 05:19    ECHO 12/14/2020  1. Left ventricular ejection fraction, by estimation, is 35 to 40%. The  left ventricle has moderately decreased function. The left ventricle  demonstrates regional wall motion abnormalities (see scoring  diagram/findings for description). Left ventricular   diastolic parameters were normal.   2. Right ventricular systolic function is normal. The right ventricular  size is normal.   3. The mitral valve is normal in structure. Mild mitral valve  regurgitation. No evidence of mitral  stenosis.   4. The aortic valve is normal in structure. Aortic valve regurgitation is  not visualized. No aortic stenosis is present.   5. The inferior vena cava is normal in size with greater than 50%  respiratory variability, suggesting right atrial pressure of 3 mmHg.   FINDINGS   Left Ventricle: Left ventricular ejection fraction, by estimation, is 35  to 40%. The left ventricle has moderately decreased function. The left  ventricle demonstrates regional wall motion abnormalities. Definity  contrast agent was given IV to delineate  the left ventricular endocardial borders. The left ventricular internal  cavity size was normal in size. There is no left ventricular hypertrophy.  Left ventricular diastolic parameters were normal.     LV Wall Scoring:  The apical lateral segment, apical septal segment, and apex are  hypokinetic.   TELEMETRY reviewed by me (LT) 08/17/2022 : Atrial fibrillation rate high 90s to low 100s   EKG reviewed by me: AF RVR rate 126  Data reviewed by me (LT) 08/17/2022: hospitalist progress note, nursing notes last 24h vitals tele labs imaging I/O   Principal Problem:   Acute on chronic systolic CHF (congestive heart failure) (HCC) Active Problems:   Hyperlipidemia   Leukocytosis   S/P bilateral BKA (  below knee amputation) (HCC)   Atrial fibrillation with rapid ventricular response (HCC)   Acute respiratory failure with hypoxia (HCC)   Seizure disorder (HCC)   CAD (coronary artery disease)   Chronic kidney disease, stage 3a ([place])   HTN (hypertension)   Depression   Myocardial injury   Type II diabetes mellitus with renal manifestations ([place])    ASSESSMENT AND PLAN:  [name] is an 37yoM with a PMH of paroxysmal AF (Xarelto), chronic HFrEF (35-40%, rWMAs 12/14/20), 1vCAD (CTO ostial LAD, 06/25/2019), CKD III, DM2, hx bilat BKA, who presented to [facility] ED 08/14/2022 with shortness of breath.  Echo this admission unchanged from prior. Clinically  improved after IV diuresis.   # Acute on chronic HFrEF Called EMS for shortness of breath that woke him from sleep, was hypoxic to 83% on room air upon their arrival.  BNP elevated at 800 and is clinically hypervolemic on exam with abdominal distention and crackles to auscultation of his lungs.  Admits to eating mostly TV dinners lately.  Takes Lasix daily but is unsure of the dose.  Clinical improvement after initial IV diuresis, weaned to room air today.  -s/p IV diuresis. Start PO lasix 40mg  daily tomorrow, 3/27  -Continue GDMT with metoprolol, isosorbide 30 mg daily.  Addition of ACE/ARB, MRA, SGLT2i limited by CKD -Stressed the importance of low-salt diet, fluid restriction. -Echo complete this admission without any change compared to 2022. -No plan for additional/invasive cardiac diagnostics at this time  # Paroxysmal atrial fibrillation with RVR Rate elevated on admission requiring diltiazem infusion.  Better today on p.o. diltiazem and p.o. metoprolol in the 90s/low 100s  -Continue Cardizem CD 120 mg daily -change metoprolol XL to tartrate 50mg  BID for rate control  -Would favor decrease of Cardizem to a stop if rate control can be achieved with uptitrating his metoprolol due to his reduced EF -Continue Xarelto for stroke risk reduction  # CKD 3 Worsened with IV diuresis, stable after PM lasix held, current BUN/Cr 72/2.07, GFR 31   # 1V CAD without chest pain # Demand ischemia Minimal high-sensitivity troponin elevation at 37, 44, 47, 48 in the absence of chest pain or EKG changes, this is most consistent with demand/supply mismatch and not ACS  -Continue aspirin 81 mg daily, and atorvastatin 80 mg daily.  [place] for discharge today from a cardiac perspective. Will arrange for follow up in clinic with Leroy Libman, PA-C at [facility] in 1-2 weeks.    This patient's plan of care was discussed and created with Dr. Clayborn Bigness and he is in agreement.  Signed: Tristan Schroeder ,  PA-C 08/17/2022, 8:00 AM [facility]

## 2022-08-23 ENCOUNTER — Encounter: Payer: Medicare Other | Admitting: Family

## 2022-08-26 NOTE — Progress Notes (Signed)
Patient ID: [name], male    DOB: 1934-09-05, 88 y.o.   MRN: [id number]  Primary cardiologist: Gwen Pounds (last seen 09/23) PCP: Rolan Lipa (last seen 03/24) Neurologist: Marlene Bast (last seen 03/24)  HPI  [name] is a 88 y/o male with a history of atrial fibrillation, prostate cancer, CKD, DM, hyperlipidemia, HTN, PVD, obstructive sleep apnea (CPAP), gangrene, previous tobacco use and chronic heart failure.  Echo 08/15/22: EF 35-40% along with mild/ mod LAE and mild/ mod MR. Echo 06/03/2018: EF of 45-50%.  Echo 11/24/17: EF of 40% along with mild MR and a PA pressure of 58 mm Hg. Echo 05/19/16: EF of 60-65% along with moderate MR.   LHC 06/25/19:  Ost LAD lesion is 100% stenosed. Ost Cx lesion is 30% stenosed. Prox Cx lesion is 30% stenosed. Mid Cx lesion is 30% stenosed. Ost RCA to Prox RCA lesion is 50% stenosed.  1.  One-vessel CAD with chronically occluded ostial LAD, unchanged compared to prior cardiac catheterization 06/05/2018 2.  Mild to moderate reduced left ventricular function with estimated LV ejection fraction 35 to 40%  Admitted 08/14/22 due to progressively worsening shortness of breath, palpitations and hypoxia due to HF exacerbation likely due to AF RVR. Initially needed nonrebreather at 15L. IV diuresed. Cardizem gtt started with transition to oral meds.    He presents today for a HF follow-up visit although hasn't been seen since 12/2018. He presents with a chief complaint of moderate fatigue with little exertion. Chronic in nature. Has associated intermittent wheezing and weakness along with this. Denies difficulty sleeping, dizziness, abdominal distention, palpitations, pedal edema, chest pain, SOB or cough.   Past Medical History:  Diagnosis Date   Acute respiratory failure with hypoxia (HCC) 02/08/2019   Arthritis    Atrial fibrillation (HCC)    Carcinoma of prostate (HCC)    CHF (congestive heart failure) (HCC)    Chronic kidney disease    Diabetes mellitus  without complication [facility])    ED (erectile dysfunction)    Frequent urination    Hyperlipidemia    Hypertension    Leukocytosis 09/06/2016   Moderate mitral insufficiency    Myocardial injury 08/14/2022   Peripheral vascular disease (HCC)    Pneumonia 04/2016   Prostate cancer (HCC)    Sleep apnea    OSA--USE C-PAP   Ulcer of left foot due to type 2 diabetes mellitus (HCC)    Urinary stress incontinence, male    Past Surgical History:  Procedure Laterality Date   AMPUTATION Left 01/12/2017   Procedure: AMPUTATION BELOW KNEE;  Surgeon: Annice Needy, MD;  Location: [facility] ORS;  Service: General;  Laterality: Left;   AMPUTATION Right 12/27/2018   Procedure: AMPUTATION BELOW KNEE;  Surgeon: Annice Needy, MD;  Location: [facility];  Service: General;  Laterality: Right;   APLIGRAFT PLACEMENT Left 10/15/2016   Procedure: APLIGRAFT PLACEMENT;  Surgeon: Recardo Evangelist, DPM;  Location: [facility];  Service: Podiatry;  Laterality: Left;  necrotic ulcer   CHOLECYSTECTOMY     COLONOSCOPY WITH PROPOFOL N/A 01/02/2019   Procedure: COLONOSCOPY WITH PROPOFOL;  Surgeon: Midge Minium, MD;  Location: [facility] ENDOSCOPY;  Service: Endoscopy;  Laterality: N/A;   COLONOSCOPY WITH PROPOFOL N/A 01/05/2019   Procedure: COLONOSCOPY WITH PROPOFOL;  Surgeon: Midge Minium, MD;  Location: [facility] ENDOSCOPY;  Service: Endoscopy;  Laterality: N/A;   ESOPHAGOGASTRODUODENOSCOPY (EGD) WITH PROPOFOL N/A 01/02/2019   Procedure: ESOPHAGOGASTRODUODENOSCOPY (EGD) WITH PROPOFOL;  Surgeon: Midge Minium, MD;  Location: [facility] ENDOSCOPY;  Service: Endoscopy;  Laterality:  N/A;   EYE SURGERY Bilateral    Cataract Extraction with IOL   HIP ARTHROPLASTY Right 10/13/2018   Procedure: ARTHROPLASTY BIPOLAR HIP (HEMIARTHROPLASTY);  Surgeon: Deeann Saint, MD;  Location: [facility];  Service: Orthopedics;  Laterality: Right;   IRRIGATION AND DEBRIDEMENT FOOT Left 10/15/2016   Procedure: IRRIGATION AND DEBRIDEMENT FOOT-EXCISIONAL DEBRIDEMENT OF SKIN 3RD,  4TH AND 5TH TOES WITH APPLICATION OF APLIGRAFT;  Surgeon: Recardo Evangelist, DPM;  Location: [facility];  Service: Podiatry;  Laterality: Left;  necrotic,gangrene   IRRIGATION AND DEBRIDEMENT FOOT Left 12/09/2016   Procedure: IRRIGATION AND DEBRIDEMENT FOOT-MUSCLE/FASCIA, RESECTION OF FOURTH AND FIFTH METATARSAL NECROTIC BONE;  Surgeon: Recardo Evangelist, DPM;  Location: [facility];  Service: Podiatry;  Laterality: Left;   IRRIGATION AND DEBRIDEMENT FOOT Right 12/23/2018   Procedure: IRRIGATION AND DEBRIDEMENT FOOT and wound vac application;  Surgeon: Gwyneth Revels, DPM;  Location: [facility];  Service: Podiatry;  Laterality: Right;   LEFT HEART CATH N/A 06/25/2019   Procedure: Left Heart Cath and Coronary Angiography;  Surgeon: Marcina Millard, MD;  Location: [facility];  Service: Cardiovascular;  Laterality: N/A;   LEFT HEART CATH AND CORONARY ANGIOGRAPHY N/A 06/05/2018   Procedure: LEFT HEART CATH AND CORONARY ANGIOGRAPHY;  Surgeon: Alwyn Pea, MD;  Location: [facility];  Service: Cardiovascular;  Laterality: N/A;   LOWER EXTREMITY ANGIOGRAPHY Left 08/16/2016   Procedure: Lower Extremity Angiography;  Surgeon: Annice Needy, MD;  Location: [facility];  Service: Cardiovascular;  Laterality: Left;   LOWER EXTREMITY ANGIOGRAPHY Left 09/01/2016   Procedure: Lower Extremity Angiography;  Surgeon: Annice Needy, MD;  Location: [facility];  Service: Cardiovascular;  Laterality: Left;   LOWER EXTREMITY ANGIOGRAPHY Left 10/14/2016   Procedure: Lower Extremity Angiography;  Surgeon: Annice Needy, MD;  Location: [facility];  Service: Cardiovascular;  Laterality: Left;   LOWER EXTREMITY ANGIOGRAPHY Left 12/20/2016   Procedure: Lower Extremity Angiography;  Surgeon: Annice Needy, MD;  Location: [facility];  Service: Cardiovascular;  Laterality: Left;   LOWER EXTREMITY ANGIOGRAPHY Right 12/18/2018   Procedure: LOWER EXTREMITY ANGIOGRAPHY;  Surgeon: Annice Needy, MD;  Location: [facility];  Service: Cardiovascular;  Laterality: Right;   LOWER EXTREMITY INTERVENTION  09/01/2016   Procedure: Lower Extremity Intervention;  Surgeon: Annice Needy, MD;  Location: [facility];  Service: Cardiovascular;;   PROSTATE SURGERY     removal   TONSILLECTOMY     as a child   Family History  Problem Relation Age of Onset   Hypertension Mother    Diabetes Mother    Prostate cancer Neg Hx    Bladder Cancer Neg Hx    Kidney disease Neg Hx    Social History   Tobacco Use   Smoking status: Former    Packs/day: .25    Types: Cigarettes    Quit date: 10/14/1994    Years since quitting: 27.8   Smokeless tobacco: Never  Substance Use Topics   Alcohol use: No    Alcohol/week: 0.0 standard drinks of alcohol   Allergies  Allergen Reactions   Contrast Media [Iodinated Contrast Media] Shortness Of Breath    SOB   Iohexol Shortness Of Breath     Desc: Respiratory Distress, Laryngedema, diaphoresis, Onset Date: [id number]    Metrizamide Shortness Of Breath    SOB SOB SOB    Latex Rash   Prior to Admission medications   Medication Sig Start Date End  Date Taking? Authorizing Provider  aspirin 81 MG EC tablet Take 81 mg by mouth daily.   Yes [provider]  atorvastatin (LIPITOR) 80 MG tablet Take 80 mg by mouth at bedtime.   Yes [provider]  diltiazem (CARDIZEM CD) 120 MG 24 hr capsule Take 120 mg by mouth daily. 10/04/20  Yes [provider]  divalproex (DEPAKOTE) 500 MG DR tablet Take 500 mg by mouth daily.    Yes [provider]  docusate sodium (COLACE) 100 MG capsule Take 1 capsule (100 mg total) by mouth 2 (two) times daily. Patient taking differently: Take 100 mg by mouth daily. 09/01/21 09/01/22 Yes Chesley Noon, MD  Ensure Max Protein (ENSURE MAX PROTEIN) LIQD Take 330 mLs (11 oz total) by mouth 2 (two) times daily. Patient taking differently: Take 11 oz by mouth daily. 04/10/20  Yes Arnetha Courser, MD  FLUoxetine (PROZAC) 40 MG capsule Take 40 mg by mouth daily.   Yes [provider]  furosemide (LASIX) 40 MG tablet Take 1 tablet (40 mg total) by mouth daily. 08/18/22  Yes Esaw Grandchild A, DO  Insulin Glargine (BASAGLAR KWIKPEN) 100 UNIT/ML Inject 25 Units into the skin daily. (Resume usual home dose if sugars are elevated) Patient taking differently: Inject 30 Units into the skin daily. (Resume usual home dose if sugars are elevated) 08/17/22  Yes Esaw Grandchild A, DO  isosorbide mononitrate (IMDUR) 30 MG 24 hr tablet Take 60 mg by mouth daily.   Yes [provider]  memantine (NAMENDA) 10 MG tablet Take 10 mg by mouth 2 (two) times daily.   Yes [provider]  metoprolol tartrate (LOPRESSOR) 50 MG tablet Take 1 tablet (50 mg total) by mouth 2 (two) times daily. 08/17/22  Yes Esaw Grandchild A, DO  Multiple Vitamin (MULTIVITAMIN) tablet Take 1 tablet by mouth daily.   Yes [provider]  NOVOLOG FLEXPEN 100 UNIT/ML FlexPen Inject 6 Units into the skin 3 (three) times daily with meals. Patient taking differently: Inject 6 Units into the skin 2 (two) times daily with a meal. 11/10/20  Yes Marrion Coy, MD  polycarbophil (FIBERCON) 625 MG tablet Take 625 mg by mouth daily.   Yes [provider]  Rivaroxaban (XARELTO) 15 MG TABS tablet Take 1 tablet (15 mg total) by mouth daily with supper. Hold Xarelto for 3 days.  Start on January 09, 2019. Patient taking differently: Take 15 mg by mouth daily. 01/09/19  Yes Shaune Pollack, MD  trimethoprim (TRIMPEX) 100 MG tablet Take 100 mg by mouth daily.   Yes [provider]  nitroGLYCERIN (NITROSTAT) 0.4 MG SL tablet Place 1 tablet (0.4 mg total) under the tongue every 5 (five) minutes as needed for chest pain. Patient not taking: Reported on 08/27/2022 10/25/20   Enedina Finner, MD   Review of Systems  Constitutional:  Positive for fatigue. Negative for appetite change.  HENT:  Positive for hearing  loss. Negative for congestion and sore throat.   Eyes: Negative.   Respiratory:  Positive for wheezing. Negative for cough, chest tightness and shortness of breath.   Cardiovascular:  Negative for chest pain, palpitations and leg swelling.  Gastrointestinal:  Negative for abdominal distention and abdominal pain.  Endocrine: Negative.   Genitourinary: Negative.   Musculoskeletal:  Positive for arthralgias (both hips).  Skin:  Positive for wound (right BKA).  Allergic/Immunologic: Negative.   Neurological:  Positive for weakness. Negative for dizziness and light-headedness.  Hematological:  Negative for adenopathy. Bruises/bleeds easily.  Psychiatric/Behavioral:  Positive for dysphoric mood. Negative for sleep disturbance (sleeping well). The patient is not nervous/anxious.    Vitals:   08/27/22 1039  BP: 138/78  Pulse: (!) 59  SpO2: 94%  Weight: 199 lb 6 oz (90.4 kg)   Wt Readings from Last 3 Encounters:  08/27/22 199 lb 6 oz (90.4 kg)  08/17/22 193 lb 2 oz (87.6 kg)  08/31/21 179 lb 14.3 oz (81.6 kg)   Lab Results  Component Value Date   CREATININE 2.07 (H) 08/17/2022   CREATININE 2.14 (H) 08/16/2022   CREATININE 1.84 (H) 08/15/2022   Physical Exam Vitals and nursing note reviewed. Exam conducted with a chaperone present (son).  Constitutional:      Appearance: He is well-developed.  HENT:     Head: Normocephalic and atraumatic.  Neck:     Vascular: No JVD.  Cardiovascular:     Rate and Rhythm: Regular rhythm. Bradycardia present.  Pulmonary:     Effort: Pulmonary effort is normal.     Breath sounds: No wheezing or rales.  Abdominal:     General: There is no distension.     Palpations: Abdomen is soft.     Tenderness: There is no abdominal tenderness.  Musculoskeletal:        General: Deformity (bilateral BKA) present. No tenderness.     Cervical back: Normal range of motion and neck supple.  Skin:    General: Skin is warm and dry.  Neurological:     Mental  Status: He is alert and oriented to person, place, and time.  Psychiatric:        Behavior: Behavior normal.        Thought Content: Thought content normal.   Assessment & Plan:  1: Chronic heart failure with reduced ejection fraction- - NYHA class III - euvolemic - weight 199.6 pounds today - not adding salt to his food - saw cardiologist Gwen Pounds) 02/02/22; getting established with one of his partners 08/31/22 - continue furosemide 40mg  daily - continue metoprolol tartrate 50mg  BID - consider SGLT2 if renal function remains stable - BNP 08/14/22 was 896.7  2: HTN- - BP 138/78 - saw PCP Rolan Lipa) 07/27/22 - BMP done 08/17/22 reviewed and shows a sodium 139, potassium 4.2, creatinine 2.07 and GFR of 31  3: Diabetes- - s/p bilateral BKA's - A1c done 07/27/22 was 8.8% - having home PT twice/ week   Due to HF stability, will not make a return appointment at this time. Follow closely with cardiology and return if needed.

## 2022-08-27 ENCOUNTER — Encounter: Payer: Self-pay | Admitting: Family

## 2022-08-27 ENCOUNTER — Ambulatory Visit: Payer: Medicare Other | Attending: Family | Admitting: Family

## 2022-08-27 VITALS — BP 138/78 | HR 59 | Wt 199.4 lb

## 2022-08-27 DIAGNOSIS — N189 Chronic kidney disease, unspecified: Secondary | ICD-10-CM | POA: Insufficient documentation

## 2022-08-27 DIAGNOSIS — I5022 Chronic systolic (congestive) heart failure: Secondary | ICD-10-CM | POA: Diagnosis not present

## 2022-08-27 DIAGNOSIS — E118 Type 2 diabetes mellitus with unspecified complications: Secondary | ICD-10-CM | POA: Diagnosis not present

## 2022-08-27 DIAGNOSIS — R531 Weakness: Secondary | ICD-10-CM | POA: Insufficient documentation

## 2022-08-27 DIAGNOSIS — Z8546 Personal history of malignant neoplasm of prostate: Secondary | ICD-10-CM | POA: Insufficient documentation

## 2022-08-27 DIAGNOSIS — I130 Hypertensive heart and chronic kidney disease with heart failure and stage 1 through stage 4 chronic kidney disease, or unspecified chronic kidney disease: Secondary | ICD-10-CM | POA: Diagnosis not present

## 2022-08-27 DIAGNOSIS — R5383 Other fatigue: Secondary | ICD-10-CM | POA: Insufficient documentation

## 2022-08-27 DIAGNOSIS — I10 Essential (primary) hypertension: Secondary | ICD-10-CM

## 2022-08-27 DIAGNOSIS — Z794 Long term (current) use of insulin: Secondary | ICD-10-CM | POA: Diagnosis not present

## 2022-08-27 DIAGNOSIS — R062 Wheezing: Secondary | ICD-10-CM | POA: Insufficient documentation

## 2022-08-27 DIAGNOSIS — Z833 Family history of diabetes mellitus: Secondary | ICD-10-CM | POA: Diagnosis not present

## 2022-08-27 DIAGNOSIS — Z87891 Personal history of nicotine dependence: Secondary | ICD-10-CM | POA: Diagnosis not present

## 2022-08-27 DIAGNOSIS — E785 Hyperlipidemia, unspecified: Secondary | ICD-10-CM | POA: Diagnosis not present

## 2022-08-27 DIAGNOSIS — Z89511 Acquired absence of right leg below knee: Secondary | ICD-10-CM | POA: Insufficient documentation

## 2022-08-27 DIAGNOSIS — Z79899 Other long term (current) drug therapy: Secondary | ICD-10-CM | POA: Diagnosis not present

## 2022-08-27 DIAGNOSIS — E1122 Type 2 diabetes mellitus with diabetic chronic kidney disease: Secondary | ICD-10-CM | POA: Insufficient documentation

## 2022-08-27 DIAGNOSIS — Z8249 Family history of ischemic heart disease and other diseases of the circulatory system: Secondary | ICD-10-CM | POA: Diagnosis not present

## 2022-08-27 DIAGNOSIS — Z89512 Acquired absence of left leg below knee: Secondary | ICD-10-CM | POA: Diagnosis not present

## 2022-08-27 DIAGNOSIS — I2510 Atherosclerotic heart disease of native coronary artery without angina pectoris: Secondary | ICD-10-CM | POA: Insufficient documentation

## 2022-08-27 DIAGNOSIS — I4891 Unspecified atrial fibrillation: Secondary | ICD-10-CM | POA: Diagnosis not present

## 2022-08-27 DIAGNOSIS — G4733 Obstructive sleep apnea (adult) (pediatric): Secondary | ICD-10-CM | POA: Diagnosis not present

## 2022-08-27 NOTE — Patient Instructions (Signed)
Call us in the future if you need us for anything 

## 2022-10-27 ENCOUNTER — Ambulatory Visit
Admission: EM | Admit: 2022-10-27 | Discharge: 2022-10-27 | Disposition: A | Discharge status: Home / Self Care | Payer: Medicare Other | Attending: Emergency Medicine | Admitting: Emergency Medicine

## 2022-10-27 DIAGNOSIS — N390 Urinary tract infection, site not specified: Secondary | ICD-10-CM | POA: Diagnosis present

## 2022-10-27 LAB — COMPREHENSIVE METABOLIC PANEL
ALT: 22 U/L (ref 0–44)
AST: 23 U/L (ref 15–41)
Albumin: 3.4 g/dL — ABNORMAL LOW (ref 3.5–5.0)
Alkaline Phosphatase: 102 U/L (ref 38–126)
Anion gap: 10 (ref 5–15)
BUN: 35 mg/dL — ABNORMAL HIGH (ref 8–23)
CO2: 24 mmol/L (ref 22–32)
Calcium: 8.4 mg/dL — ABNORMAL LOW (ref 8.9–10.3)
Chloride: 101 mmol/L (ref 98–111)
Creatinine, Ser: 1.55 mg/dL — ABNORMAL HIGH (ref 0.61–1.24)
GFR, Estimated: 44 mL/min — ABNORMAL LOW (ref >60)
Glucose, Bld: 193 mg/dL — ABNORMAL HIGH (ref 70–99)
Potassium: 3.7 mmol/L (ref 3.5–5.1)
Sodium: 135 mmol/L (ref 135–145)
Total Bilirubin: 0.8 mg/dL (ref 0.3–1.2)
Total Protein: 6.9 g/dL (ref 6.5–8.1)

## 2022-10-27 LAB — CBC WITH DIFFERENTIAL/PLATELET
Abs Immature Granulocytes: 0.04 10*3/uL (ref 0.00–0.07)
Basophils Absolute: 0.1 10*3/uL (ref 0.0–0.1)
Basophils Relative: 1 %
Eosinophils Absolute: 0.8 10*3/uL — ABNORMAL HIGH (ref 0.0–0.5)
Eosinophils Relative: 9 %
HCT: 38.5 % — ABNORMAL LOW (ref 39.0–52.0)
Hemoglobin: 13.2 g/dL (ref 13.0–17.0)
Immature Granulocytes: 0 %
Lymphocytes Relative: 16 %
Lymphs Abs: 1.5 10*3/uL (ref 0.7–4.0)
MCH: 30.3 pg (ref 26.0–34.0)
MCHC: 34.3 g/dL (ref 30.0–36.0)
MCV: 88.3 fL (ref 80.0–100.0)
Monocytes Absolute: 0.9 10*3/uL (ref 0.1–1.0)
Monocytes Relative: 10 %
Neutro Abs: 6.2 10*3/uL (ref 1.7–7.7)
Neutrophils Relative %: 64 %
Platelets: 207 10*3/uL (ref 150–400)
RBC: 4.36 MIL/uL (ref 4.22–5.81)
RDW: 13.8 % (ref 11.5–15.5)
WBC: 9.6 10*3/uL (ref 4.0–10.5)
nRBC: 0 % (ref 0.0–0.2)

## 2022-10-27 LAB — URINALYSIS, W/ REFLEX TO CULTURE (INFECTION SUSPECTED)
Bilirubin Urine: NEGATIVE
Glucose, UA: 100 mg/dL — AB
Ketones, ur: NEGATIVE mg/dL
Nitrite: NEGATIVE
Protein, ur: 100 mg/dL — AB
Specific Gravity, Urine: 1.02 (ref 1.005–1.030)
pH: 6.5 (ref 5.0–8.0)

## 2022-10-27 MED ORDER — CEPHALEXIN 500 MG PO CAPS: 500.0000 mg | ORAL_CAPSULE | Freq: Two times a day (BID) | ORAL | 0 refills | Status: AC

## 2022-10-27 NOTE — ED Triage Notes (Signed)
Pt c/o hematuria that he noticed this AM. Denies any freq,burning or pain.

## 2022-10-27 NOTE — Discharge Instructions (Addendum)
Take the Keflex 500 mg twice daily with food for 10 days for treatment of urinary tract infection.  Increase your oral fluid intake so that you increase your urine production and or flushing your urinary system.  Take an over-the-counter probiotic, such as Culturelle-Align-Activia, 1 hour after each dose of antibiotic to prevent diarrhea or yeast infections from forming.  We will culture urine and change the antibiotics if necessary.  I have made a referral to [facility] urology to have you evaluated for your urinary incontinence and recurrent urinary tract infections.  Return for reevaluation, or see your primary care provider, for any new or worsening symptoms.

## 2022-10-27 NOTE — ED Provider Notes (Signed)
[facility]    CSN: [id number] Arrival date & time: 10/27/22  1123      History   Chief Complaint Chief Complaint  Patient presents with   Hematuria    HPI Lasaro Caravantes is a 88 y.o. male.   HPI  88 year old male with a past medical history significant for prostate cancer, diabetes, peripheral artery disease, chronic atrial fibrillation, CHF, and acute kidney injury presents for evaluation of painless hematuria that started this morning.  Per the patient and his caregiver this morning the urine was a deep reddish-brown.  This is not associated with any pain or fever.  The caregiver does report that yesterday morning the patient had an episode of dry heaving and shaking which resolved after he ate.  He has no history of renal stones.  The patient is a former smoker.  Past Medical History:  Diagnosis Date   Acute respiratory failure with hypoxia (HCC) 02/08/2019   Arthritis    Atrial fibrillation (HCC)    Carcinoma of prostate (HCC)    CHF (congestive heart failure) (HCC)    Chronic kidney disease    Diabetes mellitus without complication [facility])    ED (erectile dysfunction)    Frequent urination    Hyperlipidemia    Hypertension    Leukocytosis 09/06/2016   Moderate mitral insufficiency    Myocardial injury 08/14/2022   Peripheral vascular disease (HCC)    Pneumonia 04/2016   Prostate cancer (HCC)    Sleep apnea    OSA--USE C-PAP   Ulcer of left foot due to type 2 diabetes mellitus (HCC)    Urinary stress incontinence, male     Patient Active Problem List   Diagnosis Date Noted   Acute on chronic systolic CHF (congestive heart failure) (HCC) 08/14/2022   HTN (hypertension) 08/14/2022   Depression 08/14/2022   Type II diabetes mellitus with renal manifestations (HCC) 08/14/2022   Frequent UTI 04/21/2021   Mood disorder (HCC) 04/21/2021   Chronic systolic CHF (congestive heart failure) (HCC) 03/06/2021   Hyperglycemia due to type 2 diabetes  mellitus (HCC) 11/10/2020   GERD (gastroesophageal reflux disease) 11/10/2020   Chest pain 11/09/2020   Elevated troponin I level 09/19/2020   Acute encephalopathy 07/29/2020   Lactic acidosis 07/28/2020   Hypoxic 07/28/2020   Chronic prescription opiate use 07/28/2020   Elevated troponin 07/28/2020   Chronic anticoagulation 06/25/2020   Chronic kidney disease, stage 3a (HCC) 06/25/2020   AMS (altered mental status) 04/08/2020   CAD (coronary artery disease) 03/06/2020   Personal history of COVID-19 03/06/2020   Acute toxic-metabolic encephalopathy 03/06/2020   UTI (urinary tract infection) 03/06/2020   (HFpEF) heart failure with preserved ejection fraction (HCC) 03/06/2020   Goals of care, counseling/discussion    Palliative care by specialist    Seizure disorder (HCC) 11/30/2019   ACS (acute coronary syndrome) (HCC) 06/24/2019   Sinus bradycardia on ECG 05/16/2019   Bradycardia 05/16/2019   PAF (paroxysmal atrial fibrillation) (HCC) 03/27/2019   Hx of BKA, right (HCC) 03/02/2019   Severe protein-calorie malnutrition (HCC) 02/16/2019   Acute systolic CHF (congestive heart failure) (HCC) 02/16/2019   Pneumonia due to COVID-19 virus 02/08/2019   Below-knee amputation of right lower extremity (HCC) 01/15/2019   Polyp of descending colon    Iron deficiency anemia due to chronic blood loss    Sacral decubitus ulcer, stage II (HCC) 12/26/2018   Acute respiratory failure (HCC) 12/20/2018   Femur fracture (HCC) 10/13/2018   Nonspecific chest pain 06/03/2018  Chest pain, unspecified 06/03/2018   History of arterial ischemic stroke 03/21/2018   Hypothyroidism, acquired, autoimmune 02/21/2018   Hyperlipidemia associated with type 2 diabetes mellitus (HCC) 02/21/2018   NSTEMI (non-ST elevated myocardial infarction) (HCC) 01/16/2018   Acute CHF (congestive heart failure) (HCC) 11/23/2017   Atrial fibrillation with rapid ventricular response (HCC) 10/15/2017   Seizures (HCC) 08/18/2017    BKA stump complication (HCC) 02/10/2017   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 (HCC) 02/10/2017   S/P bilateral BKA (below knee amputation) (HCC) 02/01/2017   Chronic diastolic heart failure (HCC) 01/21/2017   Pressure injury of skin 01/18/2017   Atherosclerosis of artery of extremity with gangrene (HCC) 01/05/2017   Diabetic foot ulcer (HCC) 12/29/2016   Ataxia 10/21/2016   Atherosclerosis of native arteries of the extremities with ulceration (HCC) 10/12/2016   History of femoral angiogram 09/06/2016   Left leg pain 09/06/2016   Generalized weakness 09/06/2016   AKI (acute kidney injury) (HCC) 09/06/2016   Atrial fibrillation, chronic (HCC) 08/30/2016   Pneumonia 05/19/2016   Hyperlipidemia 04/20/2016   Back pain 04/19/2016   DM (diabetes mellitus), type 2 with complications (HCC) 04/19/2016   Primary hypertension 04/19/2016   PAD (peripheral artery disease) (HCC) 04/19/2016   Erectile dysfunction following radical prostatectomy 06/25/2015   History of prostate cancer 12/23/2014   Incontinence 12/23/2014    Past Surgical History:  Procedure Laterality Date   AMPUTATION Left 01/12/2017   Procedure: AMPUTATION BELOW KNEE;  Surgeon: Annice Needy, MD;  Location: [facility];  Service: General;  Laterality: Left;   AMPUTATION Right 12/27/2018   Procedure: AMPUTATION BELOW KNEE;  Surgeon: Annice Needy, MD;  Location: [facility];  Service: General;  Laterality: Right;   APLIGRAFT PLACEMENT Left 10/15/2016   Procedure: APLIGRAFT PLACEMENT;  Surgeon: Recardo Evangelist, DPM;  Location: [facility];  Service: Podiatry;  Laterality: Left;  necrotic ulcer   CHOLECYSTECTOMY     COLONOSCOPY WITH PROPOFOL N/A 01/02/2019   Procedure: COLONOSCOPY WITH PROPOFOL;  Surgeon: Midge Minium, MD;  Location: [facility] END[facility]COPY;  Service: Endoscopy;  Laterality: N/A;   COLONOSCOPY WITH PROPOFOL N/A 01/05/2019   Procedure: COLONOSCOPY WITH PROPOFOL;  Surgeon: Midge Minium, MD;  Location: [facility] ENDOSCOPY;   Service: Endoscopy;  Laterality: N/A;   ESOPHAGOGASTRODUODENOSCOPY (EGD) WITH PROPOFOL N/A 01/02/2019   Procedure: ESOPHAGOGASTRODUODENOSCOPY (EGD) WITH PROPOFOL;  Surgeon: Midge Minium, MD;  Location: [facility];  Service: Endoscopy;  Laterality: N/A;   EYE SURGERY Bilateral    Cataract Extraction with IOL   HIP ARTHROPLASTY Right 10/13/2018   Procedure: ARTHROPLASTY BIPOLAR HIP (HEMIARTHROPLASTY);  Surgeon: Deeann Saint, MD;  Location: [facility];  Service: Orthopedics;  Laterality: Right;   IRRIGATION AND DEBRIDEMENT FOOT Left 10/15/2016   Procedure: IRRIGATION AND DEBRIDEMENT FOOT-EXCISIONAL DEBRIDEMENT OF SKIN 3RD, 4TH AND 5TH TOES WITH APPLICATION OF APLIGRAFT;  Surgeon: Recardo Evangelist, DPM;  Location: [facility];  Service: Podiatry;  Laterality: Left;  necrotic,gangrene   IRRIGATION AND DEBRIDEMENT FOOT Left 12/09/2016   Procedure: IRRIGATION AND DEBRIDEMENT FOOT-MUSCLE/FASCIA, RESECTION OF FOURTH AND FIFTH METATARSAL NECROTIC BONE;  Surgeon: Recardo Evangelist, DPM;  Location: [facility];  Service: Podiatry;  Laterality: Left;   IRRIGATION AND DEBRIDEMENT FOOT Right 12/23/2018   Procedure: IRRIGATION AND DEBRIDEMENT FOOT and wound vac application;  Surgeon: Gwyneth Revels, DPM;  Location: [facility];  Service: Podiatry;  Laterality: Right;   LEFT HEART CATH N/A 06/25/2019   Procedure: Left Heart Cath and Coronary Angiography;  Surgeon: Marcina Millard, MD;  Location: [facility];  Service:  Cardiovascular;  Laterality: N/A;   LEFT HEART CATH AND CORONARY ANGIOGRAPHY N/A 06/05/2018   Procedure: LEFT HEART CATH AND CORONARY ANGIOGRAPHY;  Surgeon: Alwyn Pea, MD;  Location: [facility];  Service: Cardiovascular;  Laterality: N/A;   LOWER EXTREMITY ANGIOGRAPHY Left 08/16/2016   Procedure: Lower Extremity Angiography;  Surgeon: Annice Needy, MD;  Location: [facility];  Service: Cardiovascular;  Laterality: Left;   LOWER EXTREMITY ANGIOGRAPHY Left 09/01/2016    Procedure: Lower Extremity Angiography;  Surgeon: Annice Needy, MD;  Location: [facility];  Service: Cardiovascular;  Laterality: Left;   LOWER EXTREMITY ANGIOGRAPHY Left 10/14/2016   Procedure: Lower Extremity Angiography;  Surgeon: Annice Needy, MD;  Location: [facility];  Service: Cardiovascular;  Laterality: Left;   LOWER EXTREMITY ANGIOGRAPHY Left 12/20/2016   Procedure: Lower Extremity Angiography;  Surgeon: Annice Needy, MD;  Location: [facility];  Service: Cardiovascular;  Laterality: Left;   LOWER EXTREMITY ANGIOGRAPHY Right 12/18/2018   Procedure: LOWER EXTREMITY ANGIOGRAPHY;  Surgeon: Annice Needy, MD;  Location: [facility];  Service: Cardiovascular;  Laterality: Right;   LOWER EXTREMITY INTERVENTION  09/01/2016   Procedure: Lower Extremity Intervention;  Surgeon: Annice Needy, MD;  Location: [facility];  Service: Cardiovascular;;   PROSTATE SURGERY     removal   TONSILLECTOMY     as a child       Home Medications    Prior to Admission medications   Medication Sig Start Date End Date Taking? Authorizing Provider  aspirin 81 MG EC tablet Take 81 mg by mouth daily.   Yes [provider]  atorvastatin (LIPITOR) 80 MG tablet Take 80 mg by mouth at bedtime.   Yes [provider]  cephALEXin (KEFLEX) 500 MG capsule Take 1 capsule (500 mg total) by mouth 2 (two) times daily for 10 days. 10/27/22 11/06/22 Yes Becky Augusta, NP  diltiazem (CARDIZEM CD) 120 MG 24 hr capsule Take 120 mg by mouth daily. 10/04/20  Yes [provider]  divalproex (DEPAKOTE) 500 MG DR tablet Take 500 mg by mouth daily.    Yes [provider]  Ensure Max Protein (ENSURE MAX PROTEIN) LIQD Take 330 mLs (11 oz total) by mouth 2 (two) times daily. Patient taking differently: Take 11 oz by mouth daily. 04/10/20  Yes Arnetha Courser, MD  FLUoxetine (PROZAC) 40 MG capsule Take 40 mg by mouth daily.   Yes [provider]  furosemide  (LASIX) 40 MG tablet Take 1 tablet (40 mg total) by mouth daily. 08/18/22  Yes Esaw Grandchild A, DO  Insulin Glargine (BASAGLAR KWIKPEN) 100 UNIT/ML Inject 25 Units into the skin daily. (Resume usual home dose if sugars are elevated) Patient taking differently: Inject 30 Units into the skin daily. (Resume usual home dose if sugars are elevated) 08/17/22  Yes Esaw Grandchild A, DO  isosorbide mononitrate (IMDUR) 30 MG 24 hr tablet Take 60 mg by mouth daily.   Yes [provider]  memantine (NAMENDA) 10 MG tablet Take 10 mg by mouth 2 (two) times daily.   Yes [provider]  metoprolol tartrate (LOPRESSOR) 50 MG tablet Take 1 tablet (50 mg total) by mouth 2 (two) times daily. 08/17/22  Yes Esaw Grandchild A, DO  Multiple Vitamin (MULTIVITAMIN) tablet Take 1 tablet by mouth daily.   Yes [provider]  NOVOLOG FLEXPEN 100 UNIT/ML FlexPen Inject 6 Units into the skin 3 (three) times daily with  meals. Patient taking differently: Inject 6 Units into the skin 2 (two) times daily with a meal. 11/10/20  Yes Marrion Coy, MD  polycarbophil (FIBERCON) 625 MG tablet Take 625 mg by mouth daily.   Yes [provider]  Rivaroxaban (XARELTO) 15 MG TABS tablet Take 1 tablet (15 mg total) by mouth daily with supper. Hold Xarelto for 3 days.  Start on January 09, 2019. Patient taking differently: Take 15 mg by mouth daily. 01/09/19  Yes Shaune Pollack, MD  trimethoprim (TRIMPEX) 100 MG tablet Take 100 mg by mouth daily.   Yes [provider]  nitroGLYCERIN (NITROSTAT) 0.4 MG SL tablet Place 1 tablet (0.4 mg total) under the tongue every 5 (five) minutes as needed for chest pain. Patient not taking: Reported on 08/27/2022 10/25/20   Enedina Finner, MD    Family History Family History  Problem Relation Age of Onset   Hypertension Mother    Diabetes Mother    Prostate cancer Neg Hx    Bladder Cancer Neg Hx    Kidney disease Neg Hx     Social History Social History   Tobacco Use    Smoking status: Former    Packs/day: .25    Types: Cigarettes    Quit date: 10/14/1994    Years since quitting: 28.0   Smokeless tobacco: Never  Vaping Use   Vaping Use: Never used  Substance Use Topics   Alcohol use: No    Alcohol/week: 0.0 standard drinks of alcohol   Drug use: No     Allergies   Iodinated contrast media, Iohexol, Metrizamide, and Latex   Review of Systems Review of Systems  Constitutional:  Negative for fever.  Genitourinary:  Positive for hematuria. Negative for dysuria, frequency and urgency.     Physical Exam Triage Vital Signs ED Triage Vitals  Enc Vitals Group     BP --      Pulse --      Resp 10/27/22 1156 16     Temp --      Temp Source 10/27/22 1156 Oral     SpO2 --      Weight 10/27/22 1155 200 lb (90.7 kg)     Height 10/27/22 1155 5\' 7"  (1.702 m)     Head Circumference --      Peak Flow --      Pain Score 10/27/22 1200 0     Pain Loc --      Pain Edu? --      Excl. in GC? --    No data found.  Updated Vital Signs BP 139/78 (BP Location: Left Arm)   Pulse (!) 52   Temp 98.5 F (36.9 C) (Oral)   Resp 16   Ht 5\' 7"  (1.702 m)   Wt 200 lb (90.7 kg)   SpO2 90%   BMI 31.32 kg/m   Visual Acuity Right Eye Distance:   Left Eye Distance:   Bilateral Distance:    Right Eye Near:   Left Eye Near:    Bilateral Near:     Physical Exam Vitals and nursing note reviewed.  Constitutional:      Appearance: Normal appearance. He is not ill-appearing or diaphoretic.  HENT:     Head: Normocephalic and atraumatic.  Eyes:     General: Scleral icterus present.     Extraocular Movements: Extraocular movements intact.     Pupils: Pupils are equal, round, and reactive to light.  Cardiovascular:     Rate and Rhythm: Normal  rate. Rhythm irregular.     Pulses: Normal pulses.     Heart sounds: Normal heart sounds. No murmur heard.    No friction rub. No gallop.  Pulmonary:     Effort: Pulmonary effort is normal.     Breath sounds:  Normal breath sounds. No wheezing, rhonchi or rales.  Abdominal:     General: Abdomen is flat.     Palpations: Abdomen is soft.     Tenderness: There is no abdominal tenderness. There is no right CVA tenderness, left CVA tenderness, guarding or rebound.  Skin:    General: Skin is warm and dry.  Neurological:     Mental Status: He is alert.      UC Treatments / Results  Labs (all labs ordered are listed, but only abnormal results are displayed) Labs Reviewed  URINALYSIS, W/ REFLEX TO CULTURE (INFECTION SUSPECTED) - Abnormal; Notable for the following components:      Result Value   Color, Urine BROWN (*)    APPearance HAZY (*)    Glucose, UA 100 (*)    Hgb urine dipstick MODERATE (*)    Protein, ur 100 (*)    Leukocytes,Ua TRACE (*)    Bacteria, UA MANY (*)    All other components within normal limits  CBC WITH DIFFERENTIAL/PLATELET - Abnormal; Notable for the following components:   HCT 38.5 (*)    Eosinophils Absolute 0.8 (*)    All other components within normal limits  COMPREHENSIVE METABOLIC PANEL - Abnormal; Notable for the following components:   Glucose, Bld 193 (*)    BUN 35 (*)    Creatinine, Ser 1.55 (*)    Calcium 8.4 (*)    Albumin 3.4 (*)    GFR, Estimated 44 (*)    All other components within normal limits  URINE CULTURE    EKG   Radiology No results found.  Procedures Procedures (including critical care time)  Medications Ordered in UC Medications - No data to display  Initial Impression / Assessment and Plan / UC Course  I have reviewed the triage vital signs and the nursing notes.  Pertinent labs & imaging results that were available during my care of the patient were reviewed by me and considered in my medical decision making (see chart for details).   Patient is a pleasant 88 year old male here with his caregiver for evaluation of painless hematuria that began this morning.  The patient has a history of urinary incontinence and he states  he does not know when he needs to use the bathroom so he is unsure if there is any urgency or frequency.  He does wear a diaper at night.  He denies any pain with urination.  The caregiver describes the urine this morning as being a deep reddish-brown.  Urine specimen that patient gave in clinic was more tea colored.  He has not had any pain he has no history of kidney stones.  No CVA tenderness on exam.  His abdomen is also soft and nontender.  Patient does have mild scleral icterus.  Urine specimen is pending.  I will also order CBC and CMP to evaluate patient's liver and renal function.  Urinalysis is brown with a hazy appearance.  Specific gravity is normal at 1.020.  Patient does have 100 glucose, moderate hemoglobin, negative bilirubin, negative ketones, 100 protein, negative nitrates, trace leukocyte esterase.  Reflex microscopy shows 6-10 WBCs, 21-50 RBCs, and many bacteria.  CBC shows a normal white count of 9.6.  H&H  is 13.2 and 38.5 respectively.  Platelets are 207.  CMP shows normal electrolytes.  Glucose is 193.  BUN is 35 and creatinine is 1.55.  This is an interval improvement from March when they were 72 and 2.07 respectively.  Patient's GFR is estimated at 44.  Transaminases are normal.  I will discharge patient home with a diagnosis of urinary tract infection.  I will start him on Keflex 500 mg twice daily for 10 days.  His calculated creatinine clearance is 37 mL/min not require renal adjustment.   Final Clinical Impressions(s) / UC Diagnoses   Final diagnoses:  Lower urinary tract infectious disease     Discharge Instructions      Take the Keflex 500 mg twice daily with food for 10 days for treatment of urinary tract infection.  Increase your oral fluid intake so that you increase your urine production and or flushing your urinary system.  Take an over-the-counter probiotic, such as Culturelle-Align-Activia, 1 hour after each dose of antibiotic to prevent diarrhea or  yeast infections from forming.  We will culture urine and change the antibiotics if necessary.  I have made a referral to [facility] urology to have you evaluated for your urinary incontinence and recurrent urinary tract infections.  Return for reevaluation, or see your primary care provider, for any new or worsening symptoms.      ED Prescriptions     Medication Sig Dispense Auth. Provider   cephALEXin (KEFLEX) 500 MG capsule Take 1 capsule (500 mg total) by mouth 2 (two) times daily for 10 days. 20 capsule Becky Augusta, NP      PDMP not reviewed this encounter.   Becky Augusta, NP 10/27/22 1309

## 2022-10-28 LAB — URINE CULTURE
Culture: 60000 — AB
Special Requests: NORMAL

## 2022-11-15 ENCOUNTER — Other Ambulatory Visit: Payer: Self-pay

## 2022-11-15 DIAGNOSIS — N390 Urinary tract infection, site not specified: Secondary | ICD-10-CM

## 2022-11-16 ENCOUNTER — Encounter: Payer: Self-pay | Admitting: Urology

## 2022-11-16 ENCOUNTER — Other Ambulatory Visit
Admission: RE | Admit: 2022-11-16 | Discharge: 2022-11-16 | Disposition: A | Discharge status: Home / Self Care | Payer: Medicare Other | Attending: Urology | Admitting: Urology

## 2022-11-16 ENCOUNTER — Ambulatory Visit (INDEPENDENT_AMBULATORY_CARE_PROVIDER_SITE_OTHER): Payer: Medicare Other | Admitting: Urology

## 2022-11-16 VITALS — BP 149/77 | HR 67 | Ht 65.0 in | Wt 200.0 lb

## 2022-11-16 DIAGNOSIS — N390 Urinary tract infection, site not specified: Secondary | ICD-10-CM

## 2022-11-16 DIAGNOSIS — N3946 Mixed incontinence: Secondary | ICD-10-CM

## 2022-11-16 DIAGNOSIS — R32 Unspecified urinary incontinence: Secondary | ICD-10-CM

## 2022-11-16 DIAGNOSIS — Z87440 Personal history of urinary (tract) infections: Secondary | ICD-10-CM

## 2022-11-16 LAB — URINALYSIS, COMPLETE (UACMP) WITH MICROSCOPIC
Bilirubin Urine: NEGATIVE
Glucose, UA: NEGATIVE mg/dL
Ketones, ur: NEGATIVE mg/dL
Nitrite: NEGATIVE
Protein, ur: 30 mg/dL — AB
Specific Gravity, Urine: 1.02 (ref 1.005–1.030)
pH: 7 (ref 5.0–8.0)

## 2022-11-16 LAB — BLADDER SCAN AMB NON-IMAGING

## 2022-11-16 MED ORDER — GEMTESA 75 MG PO TABS: 1.0000 | ORAL_TABLET | Freq: Every day | ORAL | 0 refills | Status: DC

## 2022-11-16 NOTE — Progress Notes (Signed)
   11/16/2022 1:59 PM   [name] September 03, 1934 [id number]  Reason for visit: Follow up incontinence, history of prostate cancer, recurrent UTI  HPI: Very frail 88 year old male with distant history of radical prostatectomy for low risk prostate cancer over 20 years ago(PSA remains undetectable) who is here with his son [name] who provides most of the history.  I last saw him in November 2021 for similar issues.  He has had chronic stress incontinence since his prostatectomy, but has had worsening urgency and urge incontinence over the last year.  He has had a few UTIs as well, most recently Aerococcus.  He had hematuria at the time of UTI, but no gross hematuria without infection.  I have previously offered cystoscopy for his history of recurrent UTI/hematuria, but family has deferred with his age and frailty.  They are interested in other options for bladder management, as his incontinence continues to worsen.  It sounds like this is primarily urge incontinence in both day and night.  We discussed options including a trial of OAB medication, condom catheter, or chronic indwelling urethral Foley(high suspicion that he continue to have urethral incontinence even with the suprapubic tube with his history of prostatectomy).  They are interested in a trial of OAB medication, with close follow-up in 3 to 4 weeks for condom catheter fitting.  If unable to manage the condom catheter they would like to move to an indwelling Foley which I think is reasonable at this point with his bother from his incontinence.  He is unwilling to try a Cunningham clamp again as this was very painful in the past.  Trial of Gemtesa RTC 3 to 4 weeks for condom cath fitting(can discontinue medication if no improvement) If unable to manage condom catheter, can move to indwelling urethral Foley with monthly changes   Sondra Come, MD  [facility] [address] [place], [place]  [address] [phone]

## 2022-12-14 ENCOUNTER — Ambulatory Visit (INDEPENDENT_AMBULATORY_CARE_PROVIDER_SITE_OTHER): Payer: Medicare Other | Admitting: Physician Assistant

## 2022-12-14 ENCOUNTER — Encounter: Payer: Self-pay | Admitting: Physician Assistant

## 2022-12-14 VITALS — BP 130/70 | HR 74 | Ht 67.0 in | Wt 200.0 lb

## 2022-12-14 DIAGNOSIS — N3946 Mixed incontinence: Secondary | ICD-10-CM | POA: Diagnosis not present

## 2022-12-14 MED ORDER — GEMTESA 75 MG PO TABS: 1.0000 | ORAL_TABLET | Freq: Every day | ORAL | 11 refills | Status: DC

## 2022-12-14 NOTE — Progress Notes (Signed)
12/14/2022 11:57 AM   [name] September 18, 1934 [id number]  CC: Chief Complaint  Patient presents with   Other   HPI: [name] is a 88 y.o. male with PMH remote prostate cancer s/p radical prostatectomy, mixed incontinence, and recurrent UTI who presents today for symptom recheck on Gemtesa and to discuss condom catheters.  He is accompanied today by his son, [name], who contributes to HPI.  Today he reports they have noticed a difference on Gemtesa.  He is able to void a good amount upon awakening, where previously he would continuously leak overnight.  They are unsure if he will be able to apply condom catheters, however the anticipate they will be getting a home health nurse coming out regularly.  PMH: Past Medical History:  Diagnosis Date   Acute respiratory failure with hypoxia (HCC) 02/08/2019   Arthritis    Atrial fibrillation (HCC)    Carcinoma of prostate (HCC)    CHF (congestive heart failure) (HCC)    Chronic kidney disease    Diabetes mellitus without complication [facility])    ED (erectile dysfunction)    Frequent urination    Hyperlipidemia    Hypertension    Leukocytosis 09/06/2016   Moderate mitral insufficiency    Myocardial injury 08/14/2022   Peripheral vascular disease (HCC)    Pneumonia 04/2016   Prostate cancer (HCC)    Sleep apnea    OSA--USE C-PAP   Ulcer of left foot due to type 2 diabetes mellitus (HCC)    Urinary stress incontinence, male     Surgical History: Past Surgical History:  Procedure Laterality Date   AMPUTATION Left 01/12/2017   Procedure: AMPUTATION BELOW KNEE;  Surgeon: Annice Needy, MD;  Location: [facility] ORS;  Service: General;  Laterality: Left;   AMPUTATION Right 12/27/2018   Procedure: AMPUTATION BELOW KNEE;  Surgeon: Annice Needy, MD;  Location: [facility];  Service: General;  Laterality: Right;   APLIGRAFT PLACEMENT Left 10/15/2016   Procedure: APLIGRAFT PLACEMENT;  Surgeon: Recardo Evangelist, DPM;  Location:  [facility];  Service: Podiatry;  Laterality: Left;  necrotic ulcer   CHOLECYSTECTOMY     COLONOSCOPY WITH PROPOFOL N/A 01/02/2019   Procedure: COLONOSCOPY WITH PROPOFOL;  Surgeon: Midge Minium, MD;  Location: [facility] ENDOSCOPY;  Service: Endoscopy;  Laterality: N/A;   COLONOSCOPY WITH PROPOFOL N/A 01/05/2019   Procedure: COLONOSCOPY WITH PROPOFOL;  Surgeon: Midge Minium, MD;  Location: [facility] ENDOSCOPY;  Service: Endoscopy;  Laterality: N/A;   ESOPHAGOGASTRODUODENOSCOPY (EGD) WITH PROPOFOL N/A 01/02/2019   Procedure: ESOPHAGOGASTRODUODENOSCOPY (EGD) WITH PROPOFOL;  Surgeon: Midge Minium, MD;  Location: [facility] ENDOSCOPY;  Service: Endoscopy;  Laterality: N/A;   EYE SURGERY Bilateral    Cataract Extraction with IOL   HIP ARTHROPLASTY Right 10/13/2018   Procedure: ARTHROPLASTY BIPOLAR HIP (HEMIARTHROPLASTY);  Surgeon: Deeann Saint, MD;  Location: [facility];  Service: Orthopedics;  Laterality: Right;   IRRIGATION AND DEBRIDEMENT FOOT Left 10/15/2016   Procedure: IRRIGATION AND DEBRIDEMENT FOOT-EXCISIONAL DEBRIDEMENT OF SKIN 3RD, 4TH AND 5TH TOES WITH APPLICATION OF APLIGRAFT;  Surgeon: Recardo Evangelist, DPM;  Location: [facility];  Service: Podiatry;  Laterality: Left;  necrotic,gangrene   IRRIGATION AND DEBRIDEMENT FOOT Left 12/09/2016   Procedure: IRRIGATION AND DEBRIDEMENT FOOT-MUSCLE/FASCIA, RESECTION OF FOURTH AND FIFTH METATARSAL NECROTIC BONE;  Surgeon: Recardo Evangelist, DPM;  Location: [facility];  Service: Podiatry;  Laterality: Left;   IRRIGATION AND DEBRIDEMENT FOOT Right 12/23/2018   Procedure: IRRIGATION AND DEBRIDEMENT FOOT and wound vac application;  Surgeon: Gwyneth Revels, DPM;  Location:  [facility];  Service: Podiatry;  Laterality: Right;   LEFT HEART CATH N/A 06/25/2019   Procedure: Left Heart Cath and Coronary Angiography;  Surgeon: Marcina Millard, MD;  Location: [facility] INVASIVE CV LAB;  Service: Cardiovascular;  Laterality: N/A;   LEFT HEART CATH AND CORONARY ANGIOGRAPHY N/A 06/05/2018   Procedure:  LEFT HEART CATH AND CORONARY ANGIOGRAPHY;  Surgeon: Alwyn Pea, MD;  Location: [facility];  Service: Cardiovascular;  Laterality: N/A;   LOWER EXTREMITY ANGIOGRAPHY Left 08/16/2016   Procedure: Lower Extremity Angiography;  Surgeon: Annice Needy, MD;  Location: [facility];  Service: Cardiovascular;  Laterality: Left;   LOWER EXTREMITY ANGIOGRAPHY Left 09/01/2016   Procedure: Lower Extremity Angiography;  Surgeon: Annice Needy, MD;  Location: [facility];  Service: Cardiovascular;  Laterality: Left;   LOWER EXTREMITY ANGIOGRAPHY Left 10/14/2016   Procedure: Lower Extremity Angiography;  Surgeon: Annice Needy, MD;  Location: [facility];  Service: Cardiovascular;  Laterality: Left;   LOWER EXTREMITY ANGIOGRAPHY Left 12/20/2016   Procedure: Lower Extremity Angiography;  Surgeon: Annice Needy, MD;  Location: [facility];  Service: Cardiovascular;  Laterality: Left;   LOWER EXTREMITY ANGIOGRAPHY Right 12/18/2018   Procedure: LOWER EXTREMITY ANGIOGRAPHY;  Surgeon: Annice Needy, MD;  Location: [facility];  Service: Cardiovascular;  Laterality: Right;   LOWER EXTREMITY INTERVENTION  09/01/2016   Procedure: Lower Extremity Intervention;  Surgeon: Annice Needy, MD;  Location: [facility];  Service: Cardiovascular;;   PROSTATE SURGERY     removal   TONSILLECTOMY     as a child    Home Medications:  Allergies as of 12/14/2022       Reactions   Iodinated Contrast Media Shortness Of Breath, Anaphylaxis   SOB   Iohexol Shortness Of Breath    Desc: Respiratory Distress, Laryngedema, diaphoresis, Onset Date: [id number]   Metrizamide Shortness Of Breath   SOB SOB SOB   Latex Rash        Medication List        Accurate as of December 14, 2022 11:57 AM. If you have any questions, ask your nurse or doctor.          aspirin EC 81 MG tablet Take 81 mg by mouth daily.   atorvastatin 80 MG tablet Commonly known as: LIPITOR Take 80 mg  by mouth at bedtime.   Basaglar KwikPen 100 UNIT/ML Inject 25 Units into the skin daily. (Resume usual home dose if sugars are elevated) What changed: how much to take   diltiazem 120 MG 24 hr capsule Commonly known as: CARDIZEM CD Take 120 mg by mouth daily.   divalproex 500 MG DR tablet Commonly known as: DEPAKOTE Take 500 mg by mouth daily.   Ensure Max Protein Liqd Take 330 mLs (11 oz total) by mouth 2 (two) times daily. What changed: when to take this   FLUoxetine 40 MG capsule Commonly known as: PROZAC Take 40 mg by mouth daily.   furosemide 40 MG tablet Commonly known as: LASIX Take 1 tablet (40 mg total) by mouth daily.   Gemtesa 75 MG Tabs Generic drug: Vibegron Take 1 tablet (75 mg total) by mouth daily.   isosorbide mononitrate 30 MG 24 hr tablet Commonly known as: IMDUR Take 60 mg by mouth daily.   memantine 10 MG tablet Commonly known as: NAMENDA Take 10 mg by mouth 2 (two) times daily.   metoprolol tartrate 50 MG tablet Commonly  known as: LOPRESSOR Take 1 tablet (50 mg total) by mouth 2 (two) times daily.   multivitamin tablet Take 1 tablet by mouth daily.   nitroGLYCERIN 0.4 MG SL tablet Commonly known as: NITROSTAT Place 1 tablet (0.4 mg total) under the tongue every 5 (five) minutes as needed for chest pain.   NovoLOG FlexPen 100 UNIT/ML FlexPen Generic drug: insulin aspart Inject 6 Units into the skin 3 (three) times daily with meals. What changed: when to take this   polycarbophil 625 MG tablet Commonly known as: FIBERCON Take 625 mg by mouth daily.   Rivaroxaban 15 MG Tabs tablet Commonly known as: XARELTO Take 1 tablet (15 mg total) by mouth daily with supper. Hold Xarelto for 3 days.  Start on January 09, 2019. What changed:  when to take this additional instructions   trimethoprim 100 MG tablet Commonly known as: TRIMPEX Take 100 mg by mouth daily.        Allergies:  Allergies  Allergen Reactions   Iodinated Contrast  Media Shortness Of Breath and Anaphylaxis    SOB   Iohexol Shortness Of Breath     Desc: Respiratory Distress, Laryngedema, diaphoresis, Onset Date: [id number]    Metrizamide Shortness Of Breath    SOB SOB SOB    Latex Rash    Family History: Family History  Problem Relation Age of Onset   Hypertension Mother    Diabetes Mother    Prostate cancer Neg Hx    Bladder Cancer Neg Hx    Kidney disease Neg Hx     Social History:   reports that he quit smoking about 28 years ago. His smoking use included cigarettes. He has been exposed to tobacco smoke. He has never used smokeless tobacco. He reports that he does not drink alcohol and does not use drugs.  Physical Exam: BP 130/70   Pulse 74   Ht 5\' 7"  (1.702 m)   Wt 200 lb (90.7 kg)   BMI 31.32 kg/m   Constitutional:  Alert and oriented, no acute distress, nontoxic appearing HEENT: [place], AT Cardiovascular: No clubbing, cyanosis, or edema Respiratory: Normal respiratory effort, no increased work of breathing Skin: No rashes, bruises or suspicious lesions Neurologic: Grossly intact, no focal deficits, moving all 4 extremities Psychiatric: Normal mood and affect  Assessment & Plan:   1. Mixed stress and urge urinary incontinence Symptoms improving on Gemtesa, we will plan to continue this.  I demonstrated condom catheter sizing, application, removal, and attachment to drainage bags today.  I counseled them to contact us with their preferred size if they wish to continue this, otherwise may consider indwelling Foley catheter in the future.  I do think that he would have leaking per urethra if we ever switched him to an SPT. - Vibegron (GEMTESA) 75 MG TABS; Take 1 tablet (75 mg total) by mouth daily.  Dispense: 30 tablet; Refill: 11  Return if symptoms worsen or fail to improve.  Carman Ching, PA-C  [facility] [address], [address] [place], [place] [address] [phone]

## 2023-02-17 ENCOUNTER — Other Ambulatory Visit: Payer: Self-pay

## 2023-02-17 ENCOUNTER — Inpatient Hospital Stay
Admission: EM | Admit: 2023-02-17 | Discharge: 2023-02-25 | DRG: 280 | Disposition: A | Discharge status: Skilled Nursing Facility | Payer: Medicare Other | Source: Skilled Nursing Facility | Attending: Internal Medicine | Admitting: Internal Medicine

## 2023-02-17 ENCOUNTER — Emergency Department: Payer: Medicare Other

## 2023-02-17 DIAGNOSIS — I5023 Acute on chronic systolic (congestive) heart failure: Secondary | ICD-10-CM | POA: Diagnosis present

## 2023-02-17 DIAGNOSIS — E669 Obesity, unspecified: Secondary | ICD-10-CM | POA: Diagnosis present

## 2023-02-17 DIAGNOSIS — F0393 Unspecified dementia, unspecified severity, with mood disturbance: Secondary | ICD-10-CM | POA: Diagnosis present

## 2023-02-17 DIAGNOSIS — Z66 Do not resuscitate: Secondary | ICD-10-CM | POA: Diagnosis present

## 2023-02-17 DIAGNOSIS — J9601 Acute respiratory failure with hypoxia: Secondary | ICD-10-CM | POA: Diagnosis present

## 2023-02-17 DIAGNOSIS — I2510 Atherosclerotic heart disease of native coronary artery without angina pectoris: Secondary | ICD-10-CM | POA: Diagnosis present

## 2023-02-17 DIAGNOSIS — I509 Heart failure, unspecified: Secondary | ICD-10-CM | POA: Diagnosis not present

## 2023-02-17 DIAGNOSIS — Z8249 Family history of ischemic heart disease and other diseases of the circulatory system: Secondary | ICD-10-CM

## 2023-02-17 DIAGNOSIS — I4581 Long QT syndrome: Secondary | ICD-10-CM | POA: Diagnosis not present

## 2023-02-17 DIAGNOSIS — Z6841 Body Mass Index (BMI) 40.0 and over, adult: Secondary | ICD-10-CM | POA: Diagnosis not present

## 2023-02-17 DIAGNOSIS — Z961 Presence of intraocular lens: Secondary | ICD-10-CM | POA: Diagnosis present

## 2023-02-17 DIAGNOSIS — I4892 Unspecified atrial flutter: Secondary | ICD-10-CM | POA: Diagnosis present

## 2023-02-17 DIAGNOSIS — N183 Chronic kidney disease, stage 3 unspecified: Secondary | ICD-10-CM | POA: Diagnosis not present

## 2023-02-17 DIAGNOSIS — F32A Depression, unspecified: Secondary | ICD-10-CM | POA: Diagnosis present

## 2023-02-17 DIAGNOSIS — Z8546 Personal history of malignant neoplasm of prostate: Secondary | ICD-10-CM

## 2023-02-17 DIAGNOSIS — Z89512 Acquired absence of left leg below knee: Secondary | ICD-10-CM | POA: Diagnosis not present

## 2023-02-17 DIAGNOSIS — I482 Chronic atrial fibrillation, unspecified: Secondary | ICD-10-CM | POA: Diagnosis not present

## 2023-02-17 DIAGNOSIS — I21A1 Myocardial infarction type 2: Secondary | ICD-10-CM | POA: Diagnosis present

## 2023-02-17 DIAGNOSIS — N1831 Chronic kidney disease, stage 3a: Secondary | ICD-10-CM | POA: Diagnosis present

## 2023-02-17 DIAGNOSIS — Z89511 Acquired absence of right leg below knee: Secondary | ICD-10-CM | POA: Diagnosis not present

## 2023-02-17 DIAGNOSIS — R9431 Abnormal electrocardiogram [ECG] [EKG]: Secondary | ICD-10-CM | POA: Diagnosis not present

## 2023-02-17 DIAGNOSIS — Z9841 Cataract extraction status, right eye: Secondary | ICD-10-CM

## 2023-02-17 DIAGNOSIS — E1151 Type 2 diabetes mellitus with diabetic peripheral angiopathy without gangrene: Secondary | ICD-10-CM | POA: Diagnosis present

## 2023-02-17 DIAGNOSIS — Z96641 Presence of right artificial hip joint: Secondary | ICD-10-CM | POA: Diagnosis present

## 2023-02-17 DIAGNOSIS — G4733 Obstructive sleep apnea (adult) (pediatric): Secondary | ICD-10-CM | POA: Diagnosis present

## 2023-02-17 DIAGNOSIS — I2699 Other pulmonary embolism without acute cor pulmonale: Secondary | ICD-10-CM | POA: Diagnosis not present

## 2023-02-17 DIAGNOSIS — Z91040 Latex allergy status: Secondary | ICD-10-CM

## 2023-02-17 DIAGNOSIS — Z1152 Encounter for screening for COVID-19: Secondary | ICD-10-CM | POA: Diagnosis not present

## 2023-02-17 DIAGNOSIS — Z87891 Personal history of nicotine dependence: Secondary | ICD-10-CM

## 2023-02-17 DIAGNOSIS — E785 Hyperlipidemia, unspecified: Secondary | ICD-10-CM | POA: Diagnosis present

## 2023-02-17 DIAGNOSIS — I480 Paroxysmal atrial fibrillation: Secondary | ICD-10-CM | POA: Diagnosis present

## 2023-02-17 DIAGNOSIS — I214 Non-ST elevation (NSTEMI) myocardial infarction: Secondary | ICD-10-CM | POA: Diagnosis not present

## 2023-02-17 DIAGNOSIS — I130 Hypertensive heart and chronic kidney disease with heart failure and stage 1 through stage 4 chronic kidney disease, or unspecified chronic kidney disease: Principal | ICD-10-CM | POA: Diagnosis present

## 2023-02-17 DIAGNOSIS — N1832 Chronic kidney disease, stage 3b: Secondary | ICD-10-CM | POA: Diagnosis not present

## 2023-02-17 DIAGNOSIS — Z833 Family history of diabetes mellitus: Secondary | ICD-10-CM

## 2023-02-17 DIAGNOSIS — Z7901 Long term (current) use of anticoagulants: Secondary | ICD-10-CM | POA: Diagnosis not present

## 2023-02-17 DIAGNOSIS — Z794 Long term (current) use of insulin: Secondary | ICD-10-CM | POA: Diagnosis not present

## 2023-02-17 DIAGNOSIS — J810 Acute pulmonary edema: Secondary | ICD-10-CM | POA: Diagnosis present

## 2023-02-17 DIAGNOSIS — E1169 Type 2 diabetes mellitus with other specified complication: Secondary | ICD-10-CM | POA: Diagnosis not present

## 2023-02-17 DIAGNOSIS — Z7982 Long term (current) use of aspirin: Secondary | ICD-10-CM

## 2023-02-17 DIAGNOSIS — E1122 Type 2 diabetes mellitus with diabetic chronic kidney disease: Secondary | ICD-10-CM | POA: Diagnosis present

## 2023-02-17 DIAGNOSIS — Z91041 Radiographic dye allergy status: Secondary | ICD-10-CM

## 2023-02-17 DIAGNOSIS — Z9842 Cataract extraction status, left eye: Secondary | ICD-10-CM

## 2023-02-17 DIAGNOSIS — Z888 Allergy status to other drugs, medicaments and biological substances status: Secondary | ICD-10-CM

## 2023-02-17 DIAGNOSIS — R001 Bradycardia, unspecified: Secondary | ICD-10-CM | POA: Diagnosis not present

## 2023-02-17 DIAGNOSIS — Z9049 Acquired absence of other specified parts of digestive tract: Secondary | ICD-10-CM

## 2023-02-17 DIAGNOSIS — E1121 Type 2 diabetes mellitus with diabetic nephropathy: Secondary | ICD-10-CM

## 2023-02-17 DIAGNOSIS — E1129 Type 2 diabetes mellitus with other diabetic kidney complication: Secondary | ICD-10-CM | POA: Diagnosis present

## 2023-02-17 DIAGNOSIS — I340 Nonrheumatic mitral (valve) insufficiency: Secondary | ICD-10-CM | POA: Diagnosis present

## 2023-02-17 DIAGNOSIS — Z79899 Other long term (current) drug therapy: Secondary | ICD-10-CM

## 2023-02-17 DIAGNOSIS — I4891 Unspecified atrial fibrillation: Secondary | ICD-10-CM | POA: Diagnosis not present

## 2023-02-17 LAB — COMPREHENSIVE METABOLIC PANEL
ALT: 41 U/L (ref 0–44)
AST: 29 U/L (ref 15–41)
Albumin: 3.1 g/dL — ABNORMAL LOW (ref 3.5–5.0)
Alkaline Phosphatase: 142 U/L — ABNORMAL HIGH (ref 38–126)
Anion gap: 12 (ref 5–15)
BUN: 43 mg/dL — ABNORMAL HIGH (ref 8–23)
CO2: 23 mmol/L (ref 22–32)
Calcium: 8.4 mg/dL — ABNORMAL LOW (ref 8.9–10.3)
Chloride: 102 mmol/L (ref 98–111)
Creatinine, Ser: 1.9 mg/dL — ABNORMAL HIGH (ref 0.61–1.24)
GFR, Estimated: 34 mL/min — ABNORMAL LOW (ref >60)
Glucose, Bld: 298 mg/dL — ABNORMAL HIGH (ref 70–99)
Potassium: 4.3 mmol/L (ref 3.5–5.1)
Sodium: 137 mmol/L (ref 135–145)
Total Bilirubin: 1 mg/dL (ref 0.3–1.2)
Total Protein: 7 g/dL (ref 6.5–8.1)

## 2023-02-17 LAB — RESP PANEL BY RT-PCR (RSV, FLU A&B, COVID)  RVPGX2
Influenza A by PCR: NEGATIVE
Influenza B by PCR: NEGATIVE
Resp Syncytial Virus by PCR: NEGATIVE
SARS Coronavirus 2 by RT PCR: NEGATIVE

## 2023-02-17 LAB — CBC WITH DIFFERENTIAL/PLATELET
Abs Immature Granulocytes: 0.04 10*3/uL (ref 0.00–0.07)
Basophils Absolute: 0.1 10*3/uL (ref 0.0–0.1)
Basophils Relative: 1 %
Eosinophils Absolute: 0.3 10*3/uL (ref 0.0–0.5)
Eosinophils Relative: 4 %
HCT: 38.6 % — ABNORMAL LOW (ref 39.0–52.0)
Hemoglobin: 12.6 g/dL — ABNORMAL LOW (ref 13.0–17.0)
Immature Granulocytes: 0 %
Lymphocytes Relative: 16 %
Lymphs Abs: 1.5 10*3/uL (ref 0.7–4.0)
MCH: 29.8 pg (ref 26.0–34.0)
MCHC: 32.6 g/dL (ref 30.0–36.0)
MCV: 91.3 fL (ref 80.0–100.0)
Monocytes Absolute: 0.8 10*3/uL (ref 0.1–1.0)
Monocytes Relative: 8 %
Neutro Abs: 6.8 10*3/uL (ref 1.7–7.7)
Neutrophils Relative %: 71 %
Platelets: 247 10*3/uL (ref 150–400)
RBC: 4.23 MIL/uL (ref 4.22–5.81)
RDW: 14 % (ref 11.5–15.5)
WBC: 9.5 10*3/uL (ref 4.0–10.5)
nRBC: 0 % (ref 0.0–0.2)

## 2023-02-17 LAB — GLUCOSE, CAPILLARY: Glucose-Capillary: 140 mg/dL — ABNORMAL HIGH (ref 70–99)

## 2023-02-17 LAB — TROPONIN I (HIGH SENSITIVITY)
Troponin I (High Sensitivity): 111 ng/L (ref <18)
Troponin I (High Sensitivity): 116 ng/L (ref <18)

## 2023-02-17 LAB — BRAIN NATRIURETIC PEPTIDE: B Natriuretic Peptide: 1615.1 pg/mL — ABNORMAL HIGH (ref 0.0–100.0)

## 2023-02-17 LAB — CBG MONITORING, ED: Glucose-Capillary: 233 mg/dL — ABNORMAL HIGH (ref 70–99)

## 2023-02-17 MED ORDER — METOPROLOL TARTRATE 25 MG PO TABS
25.0000 mg | ORAL_TABLET | Freq: Two times a day (BID) | ORAL | Status: DC
  Administered 2023-02-17: 25 mg via ORAL
  Filled 2023-02-17: qty 1

## 2023-02-17 MED ORDER — INSULIN ASPART 100 UNIT/ML IJ SOLN
0.0000 [IU] | Freq: Every day | INTRAMUSCULAR | Status: DC
  Filled 2023-02-17 (×2): qty 1

## 2023-02-17 MED ORDER — ISOSORBIDE MONONITRATE ER 60 MG PO TB24
60.0000 mg | ORAL_TABLET | Freq: Every day | ORAL | Status: DC
  Administered 2023-02-17 – 2023-02-25 (×9): 60 mg via ORAL
  Filled 2023-02-17 (×9): qty 1

## 2023-02-17 MED ORDER — INSULIN ASPART 100 UNIT/ML IJ SOLN
0.0000 [IU] | Freq: Three times a day (TID) | INTRAMUSCULAR | Status: DC
  Administered 2023-02-17: 5 [IU] via SUBCUTANEOUS
  Administered 2023-02-18: 2 [IU] via SUBCUTANEOUS
  Administered 2023-02-18 – 2023-02-19 (×3): 3 [IU] via SUBCUTANEOUS
  Administered 2023-02-19: 2 [IU] via SUBCUTANEOUS
  Administered 2023-02-19 – 2023-02-20 (×2): 3 [IU] via SUBCUTANEOUS
  Administered 2023-02-20: 2 [IU] via SUBCUTANEOUS
  Administered 2023-02-20: 3 [IU] via SUBCUTANEOUS
  Administered 2023-02-21: 5 [IU] via SUBCUTANEOUS
  Administered 2023-02-21: 2 [IU] via SUBCUTANEOUS
  Administered 2023-02-22: 3 [IU] via SUBCUTANEOUS
  Filled 2023-02-17 (×14): qty 1

## 2023-02-17 MED ORDER — ONDANSETRON HCL 4 MG/2ML IJ SOLN: 4.0000 mg | Freq: Four times a day (QID) | INTRAMUSCULAR | Status: DC | PRN

## 2023-02-17 MED ORDER — ONDANSETRON HCL 4 MG PO TABS: 4.0000 mg | ORAL_TABLET | Freq: Four times a day (QID) | ORAL | Status: DC | PRN

## 2023-02-17 MED ORDER — ASPIRIN 81 MG PO TBEC
81.0000 mg | DELAYED_RELEASE_TABLET | Freq: Every day | ORAL | Status: DC
  Administered 2023-02-17 – 2023-02-25 (×9): 81 mg via ORAL
  Filled 2023-02-17 (×9): qty 1

## 2023-02-17 MED ORDER — ATORVASTATIN CALCIUM 80 MG PO TABS
80.0000 mg | ORAL_TABLET | Freq: Every day | ORAL | Status: DC
  Administered 2023-02-17 – 2023-02-24 (×8): 80 mg via ORAL
  Filled 2023-02-17 (×8): qty 1

## 2023-02-17 MED ORDER — FUROSEMIDE 10 MG/ML IJ SOLN
40.0000 mg | Freq: Once | INTRAMUSCULAR | Status: AC
  Administered 2023-02-18: 40 mg via INTRAVENOUS
  Filled 2023-02-17: qty 4

## 2023-02-17 MED ORDER — INSULIN ASPART 100 UNIT/ML IJ SOLN
4.0000 [IU] | Freq: Three times a day (TID) | INTRAMUSCULAR | Status: DC
  Administered 2023-02-18 – 2023-02-22 (×12): 4 [IU] via SUBCUTANEOUS
  Filled 2023-02-17 (×10): qty 1

## 2023-02-17 MED ORDER — SODIUM CHLORIDE 0.9% FLUSH: 3.0000 mL | INTRAVENOUS | Status: DC | PRN

## 2023-02-17 MED ORDER — RIVAROXABAN 15 MG PO TABS
15.0000 mg | ORAL_TABLET | Freq: Every day | ORAL | Status: DC
  Administered 2023-02-17 – 2023-02-19 (×3): 15 mg via ORAL
  Filled 2023-02-17 (×4): qty 1

## 2023-02-17 MED ORDER — FUROSEMIDE 10 MG/ML IJ SOLN
40.0000 mg | Freq: Once | INTRAMUSCULAR | Status: AC
  Administered 2023-02-17: 40 mg via INTRAVENOUS
  Filled 2023-02-17: qty 4

## 2023-02-17 MED ORDER — SODIUM CHLORIDE 0.9 % IV SOLN: 250.0000 mL | INTRAVENOUS | Status: DC | PRN

## 2023-02-17 MED ORDER — SODIUM CHLORIDE 0.9% FLUSH
3.0000 mL | Freq: Two times a day (BID) | INTRAVENOUS | Status: DC
  Administered 2023-02-17 – 2023-02-21 (×10): 3 mL via INTRAVENOUS

## 2023-02-17 NOTE — Assessment & Plan Note (Signed)
Decompensated respiratory status now requiring 2 L nasal cannula in the setting of acute on chronic HFrEF BNP 1600 with noted pulmonary edema on chest x-ray No overt infection noted IV Lasix Strict ins and outs and daily weights Continue supplemental oxygen Monitor respiratory status and volume status closely

## 2023-02-17 NOTE — Assessment & Plan Note (Signed)
SSI A1c 

## 2023-02-17 NOTE — ED Notes (Signed)
Daughter, Luster Landsberg, number is in the chart; was called and given an update about her father

## 2023-02-17 NOTE — ED Notes (Signed)
Pt checked for purewick placement, it's running and working at this time. Pt is clean and dry. Pt also keeps leaning out of bed and was repositioned. Seizure pads were placed so pt won't lean.  Food given and sandwich was broken up into pieces so pt can eat--his teeth are not in at this time but pt is able to manage in small bites. Water given as well.

## 2023-02-17 NOTE — Assessment & Plan Note (Signed)
Baseline atrial fibrillation EKG atrial flutter with rate in the 100s Titrate rate controlling regimen Continue Xarelto Follow-up cardiology recommendations

## 2023-02-17 NOTE — ED Provider Notes (Signed)
[facility] Provider Note   Event Date/Time   First MD Initiated Contact with Patient 02/17/23 [phone]     (approximate) History  Chest Pain (Resident of [facility], was sent here this morning for chest pain and shortness of breath that began when he woke up; Was given NTG SL x 1 with relief; Upon arrival, patient denies chest pain and states that his shortness of breath has improved; Does not wear O2 at home, 87% on RA upon arrival)  HPI [name] is a 88 y.o. male with a past medical history of atrial fibrillation, CHF, and CKD who presents from a long-term care facility via EMS after complaining of chest pain this morning that resolved after 1 sublingual nitroglycerin.  Patient states that he also had associated shortness of breath however his chest pain and shortness of breath have resolved after the nitroglycerin use.  Patient also states he does not wear oxygen at home however has showing 87% on room air and was placed on 2 L nasal cannula. ROS: Patient currently denies any vision changes, tinnitus, difficulty speaking, facial droop, sore throat, abdominal pain, nausea/vomiting/diarrhea, dysuria, or weakness/numbness/paresthesias in any extremity   Physical Exam  Triage Vital Signs: ED Triage Vitals  Encounter Vitals Group     BP      Systolic BP Percentile      Diastolic BP Percentile      Pulse      Resp      Temp      Temp src      SpO2      Weight      Height      Head Circumference      Peak Flow      Pain Score      Pain Loc      Pain Education      Exclude from Growth Chart    Most recent vital signs: Vitals:   02/17/23 1430 02/17/23 1500  BP: (!) 128/96 (!) 135/93  Pulse: (!) 113 91  Resp: 13 (!) 22  Temp:    SpO2: 91% 94%   General: Awake, oriented x4. CV:  Good peripheral perfusion.  Resp:  Normal effort.  Abd:  No distention.  Other:  Elderly overweight Caucasian male resting comfortably in no acute distress.   Bilateral BKA ED Results / Procedures / Treatments  Labs (all labs ordered are listed, but only abnormal results are displayed) Labs Reviewed  COMPREHENSIVE METABOLIC PANEL - Abnormal; Notable for the following components:      Result Value   Glucose, Bld 298 (*)    BUN 43 (*)    Creatinine, Ser 1.90 (*)    Calcium 8.4 (*)    Albumin 3.1 (*)    Alkaline Phosphatase 142 (*)    GFR, Estimated 34 (*)    All other components within normal limits  CBC WITH DIFFERENTIAL/PLATELET - Abnormal; Notable for the following components:   Hemoglobin 12.6 (*)    HCT 38.6 (*)    All other components within normal limits  BRAIN NATRIURETIC PEPTIDE - Abnormal; Notable for the following components:   B Natriuretic Peptide 1,615.1 (*)    All other components within normal limits  TROPONIN I (HIGH SENSITIVITY) - Abnormal; Notable for the following components:   Troponin I (High Sensitivity) 111 (*)    All other components within normal limits  TROPONIN I (HIGH SENSITIVITY) - Abnormal; Notable for the following components:   Troponin I (High Sensitivity)  116 (*)    All other components within normal limits  RESP PANEL BY RT-PCR (RSV, FLU A&B, COVID)  [id number]   EKG ED ECG REPORT I, Merwyn Katos, the attending physician, personally viewed and interpreted this ECG. Date: 02/17/2023 EKG Time: 0811 Rate: 114 Rhythm: Atrial flutter with variable conduction QRS Axis: normal Intervals: normal ST/T Wave abnormalities: normal Narrative Interpretation: Atrial flutter with variable conduction.  No evidence of acute ischemia RADIOLOGY ED MD interpretation: Single view portable chest x-ray shows cardiomegaly with vascular congestion and mild pulmonary interstitial edema -Agree with radiology assessment Official radiology report(s): DG Chest Port 1 View  Result Date: 02/17/2023 CLINICAL DATA:  Right chest pain and shortness of breath EXAM: PORTABLE CHEST 1 VIEW COMPARISON:  Chest radiograph 08/14/2022  FINDINGS: The heart is enlarged.  The upper mediastinal contours are normal. There is vascular congestion with possible mild pulmonary interstitial edema. There is no focal airspace consolidation. There is no pleural effusion or pneumothorax There is no acute osseous abnormality. IMPRESSION: Cardiomegaly with vascular congestion and possible mild pulmonary interstitial edema. Electronically Signed   By: Lesia Hausen M.D.   On: 02/17/2023 09:33   PROCEDURES: Critical Care performed: Yes, see critical care procedure note(s) .1-3 Lead EKG Interpretation  Performed by: Merwyn Katos, MD Authorized by: Merwyn Katos, MD     Interpretation: abnormal     ECG rate:  101   ECG rate assessment: tachycardic     Rhythm: sinus tachycardia     Ectopy: none     Conduction: normal   CRITICAL CARE Performed by: Merwyn Katos  Total critical care time: 31 minutes  Critical care time was exclusive of separately billable procedures and treating other patients.  Critical care was necessary to treat or prevent imminent or life-threatening deterioration.  Critical care was time spent personally by me on the following activities: development of treatment plan with patient and/or surrogate as well as nursing, discussions with consultants, evaluation of patient's response to treatment, examination of patient, obtaining history from patient or surrogate, ordering and performing treatments and interventions, ordering and review of laboratory studies, ordering and review of radiographic studies, pulse oximetry and re-evaluation of patient's condition.  MEDICATIONS ORDERED IN ED: Medications  sodium chloride flush (NS) 0.9 % injection 3 mL (3 mLs Intravenous Given 02/17/23 1200)  sodium chloride flush (NS) 0.9 % injection 3 mL (has no administration in time range)  0.9 %  sodium chloride infusion (has no administration in time range)  ondansetron (ZOFRAN) tablet 4 mg (has no administration in time range)    Or   ondansetron (ZOFRAN) injection 4 mg (has no administration in time range)  insulin aspart (novoLOG) injection 0-15 Units (has no administration in time range)  insulin aspart (novoLOG) injection 0-5 Units (has no administration in time range)  insulin aspart (novoLOG) injection 4 Units (has no administration in time range)  furosemide (LASIX) injection 40 mg (has no administration in time range)  metoprolol tartrate (LOPRESSOR) tablet 25 mg (has no administration in time range)  aspirin EC tablet 81 mg (81 mg Oral Given 02/17/23 1424)  atorvastatin (LIPITOR) tablet 80 mg (has no administration in time range)  isosorbide mononitrate (IMDUR) 24 hr tablet 60 mg (has no administration in time range)  Rivaroxaban (XARELTO) tablet 15 mg (has no administration in time range)  furosemide (LASIX) injection 40 mg (40 mg Intravenous Given 02/17/23 1039)   IMPRESSION / MDM / ASSESSMENT AND PLAN / ED COURSE  I reviewed the  triage vital signs and the nursing notes.                             The patient is on the cardiac monitor to evaluate for evidence of arrhythmia and/or significant heart rate changes. Patient's presentation is most consistent with acute presentation with potential threat to life or bodily function. Endorses dyspnea, endorses LE edema Denies Non adherence to medication regimen  Workup: ECG, CBC, BMP, Troponin, BNP, CXR Findings: EKG: No STEMI and no evidence of Brugada's sign, delta wave, epsilon wave, significantly prolonged QTc, or malignant arrhythmia. BNP: 1615 CXR: Cardiomegaly with vascular congestion and pulmonary edema Based on history, exam and findings, presentation most consistent with acute on chronic heart failure. Low suspicion for PNA, ACS, tamponade, aortic dissection. Interventions: Oxygen, Diuresis  Reassessment: Symptoms improved in ED with oxygen and diuresis  Disposition (Stable but not significantly improved): Admit to medicine for further monitoring and  for improvement of medication regimen to control symptoms.    FINAL CLINICAL IMPRESSION(S) / ED DIAGNOSES   Final diagnoses:  Acute pulmonary edema (HCC)  Acute respiratory failure with hypoxia (HCC)   Rx / DC Orders   ED Discharge Orders     None      Note:  This document was prepared using Dragon voice recognition software and may include unintentional dictation errors.   Merwyn Katos, MD 02/17/23 [phone]

## 2023-02-17 NOTE — ED Triage Notes (Signed)
Resident of [facility], was sent here this morning for chest pain and shortness of breath that began when he woke up; Was given NTG SL x 1 with relief; Upon arrival, patient denies chest pain and states that his shortness of breath has improved; Does not wear O2 at home, 87% on RA upon arrival

## 2023-02-17 NOTE — Assessment & Plan Note (Signed)
Troponin 100s without active chest pain in setting of acute on chronic HFrEF EKG stable Likely secondary demand ischemia Continue home regimen including baby aspirin Follow-up cardiology recommendations

## 2023-02-17 NOTE — Assessment & Plan Note (Signed)
2D echo March 2024 with EF of 35-40% as well as moderate mitral regurgitation Positive acute volume overload with noted BNP of 1600 as well as pulmonary edema chest x-ray Status post IV Lasix Continue to follow volume status with diuresis Continue GDMT Cardiology formally consulted Follow recommendations

## 2023-02-17 NOTE — Assessment & Plan Note (Signed)
Baseline creatinine 1.5-2.1 with GFR in the 30s to 40s Creatinine 1.9 today with GFR in the 30s Monitor renal function with diuresis

## 2023-02-17 NOTE — H&P (Addendum)
History and Physical    Patient: [name] KGU:[id number] DOB: [birth year] DOA: 02/17/2023 DOS: the patient was seen and examined on 02/17/2023 PCP: Luciana Axe, NP  Patient coming from: SNF  Chief Complaint:  Chief Complaint  Patient presents with   Chest Pain    Resident of [facility], was sent here this morning for chest pain and shortness of breath that began when he woke up; Was given NTG SL x 1 with relief; Upon arrival, patient denies chest pain and states that his shortness of breath has improved; Does not wear O2 at home, 87% on RA upon arrival   HPI: [name] is a 88 y.o. male with medical history significant of HFrEF, atrial fibrillation, stage III CKD, hypertension, hyperlipidemia, type 2 diabetes presenting with acute respiratory failure with hypoxia, acute on chronic HFrEF.  Limited history in the setting of age and dementia.  History primarily from patient's son [name] ([id number]) who is also HCPOA.  Per report, patient with increased work of breathing cough over 1 to 2 days.  No reported fevers or chills.  No reported nausea or vomiting.  Per report, has been compliant with diuretic regimen at facility.  No reported chest pain.  No focal hemiparesis reported. Presented to the ER afebrile, hemodynamically stable.  Satting in the mid 80s on room air, transition to 2 L nasal cannula to keep O2 sats greater than 94%.  White count 9.5, hemoglobin 12.6, platelets 247, troponin 1 11-1 16, COVID flu and RSV negative, creatinine 1.9.  BNP 1600.  Chest x-ray with cardiomegaly and interstitial pulmonary edema. Review of Systems: unable to review all systems due to the inability of the patient to answer questions. Past Medical History:  Diagnosis Date   Acute respiratory failure with hypoxia (HCC) 02/08/2019   Arthritis    Atrial fibrillation (HCC)    Carcinoma of prostate (HCC)    CHF (congestive heart failure) (HCC)    Chronic kidney disease     Diabetes mellitus without complication [facility])    ED (erectile dysfunction)    Frequent urination    Hyperlipidemia    Hypertension    Leukocytosis 09/06/2016   Moderate mitral insufficiency    Myocardial injury 08/14/2022   Peripheral vascular disease (HCC)    Pneumonia 04/2016   Prostate cancer (HCC)    Sleep apnea    OSA--USE C-PAP   Ulcer of left foot due to type 2 diabetes mellitus (HCC)    Urinary stress incontinence, male    Past Surgical History:  Procedure Laterality Date   AMPUTATION Left 01/12/2017   Procedure: AMPUTATION BELOW KNEE;  Surgeon: Annice Needy, MD;  Location: [facility] ORS;  Service: General;  Laterality: Left;   AMPUTATION Right 12/27/2018   Procedure: AMPUTATION BELOW KNEE;  Surgeon: Annice Needy, MD;  Location: [facility];  Service: General;  Laterality: Right;   APLIGRAFT PLACEMENT Left 10/15/2016   Procedure: APLIGRAFT PLACEMENT;  Surgeon: Recardo Evangelist, DPM;  Location: [facility];  Service: Podiatry;  Laterality: Left;  necrotic ulcer   CHOLECYSTECTOMY     COLONOSCOPY WITH PROPOFOL N/A 01/02/2019   Procedure: COLONOSCOPY WITH PROPOFOL;  Surgeon: Midge Minium, MD;  Location: [facility] ENDOSCOPY;  Service: Endoscopy;  Laterality: N/A;   COLONOSCOPY WITH PROPOFOL N/A 01/05/2019   Procedure: COLONOSCOPY WITH PROPOFOL;  Surgeon: Midge Minium, MD;  Location: [facility] ENDOSCOPY;  Service: Endoscopy;  Laterality: N/A;   ESOPHAGOGASTRODUODENOSCOPY (EGD) WITH PROPOFOL N/A 01/02/2019   Procedure: ESOPHAGOGASTRODUODENOSCOPY (EGD) WITH PROPOFOL;  Surgeon: Servando Snare,  Darren, MD;  Location: [facility] ENDOSCOPY;  Service: Endoscopy;  Laterality: N/A;   EYE SURGERY Bilateral    Cataract Extraction with IOL   HIP ARTHROPLASTY Right 10/13/2018   Procedure: ARTHROPLASTY BIPOLAR HIP (HEMIARTHROPLASTY);  Surgeon: Deeann Saint, MD;  Location: [facility];  Service: Orthopedics;  Laterality: Right;   IRRIGATION AND DEBRIDEMENT FOOT Left 10/15/2016   Procedure: IRRIGATION AND DEBRIDEMENT FOOT-EXCISIONAL  DEBRIDEMENT OF SKIN 3RD, 4TH AND 5TH TOES WITH APPLICATION OF APLIGRAFT;  Surgeon: Recardo Evangelist, DPM;  Location: [facility];  Service: Podiatry;  Laterality: Left;  necrotic,gangrene   IRRIGATION AND DEBRIDEMENT FOOT Left 12/09/2016   Procedure: IRRIGATION AND DEBRIDEMENT FOOT-MUSCLE/FASCIA, RESECTION OF FOURTH AND FIFTH METATARSAL NECROTIC BONE;  Surgeon: Recardo Evangelist, DPM;  Location: [facility];  Service: Podiatry;  Laterality: Left;   IRRIGATION AND DEBRIDEMENT FOOT Right 12/23/2018   Procedure: IRRIGATION AND DEBRIDEMENT FOOT and wound vac application;  Surgeon: Gwyneth Revels, DPM;  Location: [facility];  Service: Podiatry;  Laterality: Right;   LEFT HEART CATH N/A 06/25/2019   Procedure: Left Heart Cath and Coronary Angiography;  Surgeon: Marcina Millard, MD;  Location: [facility];  Service: Cardiovascular;  Laterality: N/A;   LEFT HEART CATH AND CORONARY ANGIOGRAPHY N/A 06/05/2018   Procedure: LEFT HEART CATH AND CORONARY ANGIOGRAPHY;  Surgeon: Alwyn Pea, MD;  Location: [facility];  Service: Cardiovascular;  Laterality: N/A;   LOWER EXTREMITY ANGIOGRAPHY Left 08/16/2016   Procedure: Lower Extremity Angiography;  Surgeon: Annice Needy, MD;  Location: [facility];  Service: Cardiovascular;  Laterality: Left;   LOWER EXTREMITY ANGIOGRAPHY Left 09/01/2016   Procedure: Lower Extremity Angiography;  Surgeon: Annice Needy, MD;  Location: [facility];  Service: Cardiovascular;  Laterality: Left;   LOWER EXTREMITY ANGIOGRAPHY Left 10/14/2016   Procedure: Lower Extremity Angiography;  Surgeon: Annice Needy, MD;  Location: [facility];  Service: Cardiovascular;  Laterality: Left;   LOWER EXTREMITY ANGIOGRAPHY Left 12/20/2016   Procedure: Lower Extremity Angiography;  Surgeon: Annice Needy, MD;  Location: [facility];  Service: Cardiovascular;  Laterality: Left;   LOWER EXTREMITY ANGIOGRAPHY Right 12/18/2018   Procedure: LOWER EXTREMITY  ANGIOGRAPHY;  Surgeon: Annice Needy, MD;  Location: [facility];  Service: Cardiovascular;  Laterality: Right;   LOWER EXTREMITY INTERVENTION  09/01/2016   Procedure: Lower Extremity Intervention;  Surgeon: Annice Needy, MD;  Location: [facility];  Service: Cardiovascular;;   PROSTATE SURGERY     removal   TONSILLECTOMY     as a child   Social History:  reports that he quit smoking about 28 years ago. His smoking use included cigarettes. He has been exposed to tobacco smoke. He has never used smokeless tobacco. He reports that he does not drink alcohol and does not use drugs.  Allergies  Allergen Reactions   Iodinated Contrast Media Shortness Of Breath and Anaphylaxis    SOB   Iohexol Shortness Of Breath     Desc: Respiratory Distress, Laryngedema, diaphoresis, Onset Date: [id number]    Metrizamide Shortness Of Breath    SOB SOB SOB    Latex Rash    Family History  Problem Relation Age of Onset   Hypertension Mother    Diabetes Mother    Prostate cancer Neg Hx    Bladder Cancer Neg Hx    Kidney disease Neg Hx     Prior to Admission medications   Medication Sig Start Date End Date Taking?  Authorizing Provider  aspirin 81 MG EC tablet Take 81 mg by mouth daily.   Yes [provider]  atorvastatin (LIPITOR) 80 MG tablet Take 80 mg by mouth at bedtime.   Yes [provider]  bisacodyl (DULCOLAX) 5 MG EC tablet Take 5 mg by mouth daily as needed for moderate constipation.   Yes [provider]  diltiazem (CARDIZEM CD) 120 MG 24 hr capsule Take 120 mg by mouth daily. 10/04/20  Yes [provider]  divalproex (DEPAKOTE) 500 MG DR tablet Take 500 mg by mouth daily.    Yes [provider]  ferrous sulfate 325 (65 FE) MG tablet Take 325 mg by mouth daily with breakfast.   Yes [provider]  FLUoxetine HCl 60 MG TABS Take 1 tablet by mouth daily. 02/04/23  Yes [provider]  furosemide (LASIX) 40 MG tablet  Take 1 tablet (40 mg total) by mouth daily. 08/18/22  Yes Esaw Grandchild A, DO  isosorbide mononitrate (IMDUR) 30 MG 24 hr tablet Take 60 mg by mouth daily.   Yes [provider]  LANTUS 100 UNIT/ML injection Inject 45 Units into the skin at bedtime. 02/07/23  Yes [provider]  memantine (NAMENDA) 10 MG tablet Take 10 mg by mouth 2 (two) times daily.   Yes [provider]  metoprolol tartrate (LOPRESSOR) 25 MG tablet Take 25 mg by mouth 2 (two) times daily. 12/04/22  Yes [provider]  Multiple Vitamin (MULTIVITAMIN) tablet Take 1 tablet by mouth daily.   Yes [provider]  nitroGLYCERIN (NITROSTAT) 0.4 MG SL tablet Place 1 tablet (0.4 mg total) under the tongue every 5 (five) minutes as needed for chest pain. 10/25/20  Yes Enedina Finner, MD  NOVOLOG FLEXPEN 100 UNIT/ML FlexPen Inject 6 Units into the skin 3 (three) times daily with meals. Patient taking differently: Inject 6-8 Units into the skin 3 (three) times daily with meals. 6 units at breakfast and dinner and 8 units at lunch 11/10/20  Yes Marrion Coy, MD  Rivaroxaban (XARELTO) 15 MG TABS tablet Take 1 tablet (15 mg total) by mouth daily with supper. Hold Xarelto for 3 days.  Start on January 09, 2019. 01/09/19  Yes Shaune Pollack, MD  trimethoprim (TRIMPEX) 100 MG tablet Take 100 mg by mouth daily.   Yes [provider]  Vibegron (GEMTESA) 75 MG TABS Take 1 tablet (75 mg total) by mouth daily. 12/14/22  Yes Vaillancourt, Lelon Mast, PA-C  Ensure Max Protein (ENSURE MAX PROTEIN) LIQD Take 330 mLs (11 oz total) by mouth 2 (two) times daily. Patient taking differently: Take 11 oz by mouth daily. 04/10/20   Arnetha Courser, MD  FLUoxetine (PROZAC) 40 MG capsule Take 40 mg by mouth daily. Patient not taking: Reported on 02/17/2023    [provider]  Insulin Glargine (BASAGLAR KWIKPEN) 100 UNIT/ML Inject 25 Units into the skin daily. (Resume usual home dose if sugars are elevated) Patient not  taking: Reported on 02/17/2023 08/17/22   Esaw Grandchild A, DO  metoprolol tartrate (LOPRESSOR) 50 MG tablet Take 1 tablet (50 mg total) by mouth 2 (two) times daily. Patient not taking: Reported on 02/17/2023 08/17/22   Esaw Grandchild A, DO  polycarbophil (FIBERCON) 625 MG tablet Take 625 mg by mouth daily. Patient not taking: Reported on 02/17/2023    [provider]    Physical Exam: Vitals:   02/17/23 0815 02/17/23 0840 02/17/23 0900 02/17/23 1100  BP: (!) 132/104 (!) 127/97 (!) 142/86 (!) 156/93  Pulse: 100 89  76  Resp: (!) 26 (!) 23 (!) 24 (!) 21  Temp: 98.5 F (36.9 C)     TempSrc: Oral     SpO2: (!) 87% 92% 94% 95%  Weight:      Height:       Physical Exam Constitutional:      Appearance: He is obese.  HENT:     Head: Normocephalic and atraumatic.     Nose: Nose normal.     Mouth/Throat:     Mouth: Mucous membranes are moist.  Eyes:     Pupils: Pupils are equal, round, and reactive to light.  Cardiovascular:     Rate and Rhythm: Normal rate and regular rhythm.  Pulmonary:     Effort: Pulmonary effort is normal.  Abdominal:     General: Bowel sounds are normal.  Musculoskeletal:     Comments: Bilateral BKA  Skin:    General: Skin is warm.  Neurological:     General: No focal deficit present.     Comments: Positive generalized confusion, patient sleeping  Psychiatric:        Mood and Affect: Mood normal.     Data Reviewed:  There are no new results to review at this time. DG Chest Port 1 View CLINICAL DATA:  Right chest pain and shortness of breath  EXAM: PORTABLE CHEST 1 VIEW  COMPARISON:  Chest radiograph 08/14/2022  FINDINGS: The heart is enlarged.  The upper mediastinal contours are normal.  There is vascular congestion with possible mild pulmonary interstitial edema. There is no focal airspace consolidation. There is no pleural effusion or pneumothorax  There is no acute osseous abnormality.  IMPRESSION: Cardiomegaly with vascular  congestion and possible mild pulmonary interstitial edema.  Electronically Signed   By: Lesia Hausen M.D.   On: 02/17/2023 09:33  Lab Results  Component Value Date   WBC 9.5 02/17/2023   HGB 12.6 (L) 02/17/2023   HCT 38.6 (L) 02/17/2023   MCV 91.3 02/17/2023   PLT 247 02/17/2023   Last metabolic panel Lab Results  Component Value Date   GLUCOSE 298 (H) 02/17/2023   NA 137 02/17/2023   K 4.3 02/17/2023   CL 102 02/17/2023   CO2 23 02/17/2023   BUN 43 (H) 02/17/2023   CREATININE 1.90 (H) 02/17/2023   GFRNONAA 34 (L) 02/17/2023   CALCIUM 8.4 (L) 02/17/2023   PHOS 2.3 (L) 03/07/2021   PROT 7.0 02/17/2023   ALBUMIN 3.1 (L) 02/17/2023   BILITOT 1.0 02/17/2023   ALKPHOS 142 (H) 02/17/2023   AST 29 02/17/2023   ALT 41 02/17/2023   ANIONGAP 12 02/17/2023    Assessment and Plan: Acute on chronic systolic CHF (congestive heart failure) (HCC) 2D echo March 2024 with EF of 35-40% as well as moderate mitral regurgitation Positive acute volume overload with noted BNP of 1600 as well as pulmonary edema chest x-ray Status post IV Lasix Continue to follow volume status with diuresis Continue GDMT Cardiology formally consulted Follow recommendations  Acute respiratory failure with hypoxia (HCC) Decompensated respiratory status now requiring 2 L nasal cannula in the setting of acute on chronic HFrEF BNP 1600 with noted pulmonary edema on chest x-ray No overt infection noted IV Lasix Strict ins and outs and daily weights Continue supplemental oxygen Monitor respiratory status and volume status closely  Chronic kidney disease, stage 3a (HCC) Baseline creatinine 1.5-2.1 with GFR in the 30s to 40s Creatinine 1.9 today with GFR in the 30s Monitor renal function with  diuresis  Type II diabetes mellitus with renal manifestations (HCC) SSI A1c  PAF (paroxysmal atrial fibrillation) (HCC) Baseline atrial fibrillation EKG atrial flutter with rate in the 100s Titrate rate  controlling regimen Continue Xarelto Follow-up cardiology recommendations  NSTEMI (non-ST elevated myocardial infarction) (HCC) Troponin 100s without active chest pain in setting of acute on chronic HFrEF EKG stable Likely secondary demand ischemia Continue home regimen including baby aspirin Follow-up cardiology recommendations      Advance Care Planning:   Code Status: Limited: Do not attempt resuscitation (DNR) -DNR-LIMITED -Do Not Intubate/DNI    Consults: Cardiology   Family Communication: Case discussed w/ son [name] over the phone   Severity of Illness: The appropriate patient status for this patient is INPATIENT. Inpatient status is judged to be reasonable and necessary in order to provide the required intensity of service to ensure the patient's safety. The patient's presenting symptoms, physical exam findings, and initial radiographic and laboratory data in the context of their chronic comorbidities is felt to place them at high risk for further clinical deterioration. Furthermore, it is not anticipated that the patient will be medically stable for discharge from the hospital within 2 midnights of admission.   * I certify that at the point of admission it is my clinical judgment that the patient will require inpatient hospital care spanning beyond 2 midnights from the point of admission due to high intensity of service, high risk for further deterioration and high frequency of surveillance required.*  Author: Floydene Flock, MD 02/17/2023 12:02 PM  For on call review [email or web address].

## 2023-02-17 NOTE — Consult Note (Addendum)
[facility] CLINIC CARDIOLOGY CONSULT NOTE       Patient ID: [name] MRN: [id number] DOB/AGE: 86-17-1936 86 y.o.  Admit date: 02/17/2023 Referring Physician Dr. Doree Albee Primary Physician Laren Everts, NP Primary Cardiologist Marijo Conception, NP Reason for Consultation AoCHF  HPI: [name] is a 88 y.o. male  with a past medical history of paroxysmal AF (Xarelto), chronic HFrEF (35-40%, rWMAs 07/2022), 1vCAD (CTO ostial LAD, 06/2019), CKD III, DM2, hx bilat BKA who presented to the ED on 02/17/2023 for chest pain and shortness of breath. Cardiology was consulted for further evaluation.   Portions of the history obtained via chart review as the patient is resting at the time of my evaluation and does not want to answer questions.  Early this morning he was brought to the ED from his long-term care facility because he was complaining of chest pain and shortness of breath.  Received 1 sublingual nitroglycerin and he reported that this improved his symptoms.  Found to be hypoxic by EMS with O2 saturation 87% and thus was placed on supplemental oxygen.  Workup in the ED notable for chest x-ray demonstrating vascular congestion and mild pulmonary edema.  BNP found to be elevated at 1600.  Creatinine 1.9, potassium 4.3, hemoglobin 12.6. EKG with atrial fibrillation rate 113.   At the time of my evaluation this AM he is resting comfortably in ED stretcher. He denies any complaints. States chest pain has resolved, denies any SOB. Supplemental oxygen in place and saturations ranging between 88-92. Has had 1 L UOP since receiving IV lasix this AM. States that he did have some palpitations earlier today but this has improved. Rate better controlled on telemetry this afternoon.   Review of systems complete and found to be negative unless listed above    Past Medical History:  Diagnosis Date   Acute respiratory failure with hypoxia (HCC) 02/08/2019   Arthritis    Atrial  fibrillation (HCC)    Carcinoma of prostate (HCC)    CHF (congestive heart failure) (HCC)    Chronic kidney disease    Diabetes mellitus without complication (HCC)    ED (erectile dysfunction)    Frequent urination    Hyperlipidemia    Hypertension    Leukocytosis 09/06/2016   Moderate mitral insufficiency    Myocardial injury 08/14/2022   Peripheral vascular disease (HCC)    Pneumonia 04/2016   Prostate cancer (HCC)    Sleep apnea    OSA--USE C-PAP   Ulcer of left foot due to type 2 diabetes mellitus (HCC)    Urinary stress incontinence, male     Past Surgical History:  Procedure Laterality Date   AMPUTATION Left 01/12/2017   Procedure: AMPUTATION BELOW KNEE;  Surgeon: Annice Needy, MD;  Location: [facility] ORS;  Service: General;  Laterality: Left;   AMPUTATION Right 12/27/2018   Procedure: AMPUTATION BELOW KNEE;  Surgeon: Annice Needy, MD;  Location: [facility] ORS;  Service: General;  Laterality: Right;   APLIGRAFT PLACEMENT Left 10/15/2016   Procedure: APLIGRAFT PLACEMENT;  Surgeon: Recardo Evangelist, DPM;  Location: [facility] ORS;  Service: Podiatry;  Laterality: Left;  necrotic ulcer   CHOLECYSTECTOMY     COLONOSCOPY WITH PROPOFOL N/A 01/02/2019   Procedure: COLONOSCOPY WITH PROPOFOL;  Surgeon: Midge Minium, MD;  Location: [facility] ENDOSCOPY;  Service: Endoscopy;  Laterality: N/A;   COLONOSCOPY WITH PROPOFOL N/A 01/05/2019   Procedure: COLONOSCOPY WITH PROPOFOL;  Surgeon: Midge Minium, MD;  Location: [facility] ENDOSCOPY;  Service: Endoscopy;  Laterality: N/A;  ESOPHAGOGASTRODUODENOSCOPY (EGD) WITH PROPOFOL N/A 01/02/2019   Procedure: ESOPHAGOGASTRODUODENOSCOPY (EGD) WITH PROPOFOL;  Surgeon: Midge Minium, MD;  Location: [facility] ENDOSCOPY;  Service: Endoscopy;  Laterality: N/A;   EYE SURGERY Bilateral    Cataract Extraction with IOL   HIP ARTHROPLASTY Right 10/13/2018   Procedure: ARTHROPLASTY BIPOLAR HIP (HEMIARTHROPLASTY);  Surgeon: Deeann Saint, MD;  Location: [facility] ORS;  Service: Orthopedics;   Laterality: Right;   IRRIGATION AND DEBRIDEMENT FOOT Left 10/15/2016   Procedure: IRRIGATION AND DEBRIDEMENT FOOT-EXCISIONAL DEBRIDEMENT OF SKIN 3RD, 4TH AND 5TH TOES WITH APPLICATION OF APLIGRAFT;  Surgeon: Recardo Evangelist, DPM;  Location: [facility] ORS;  Service: Podiatry;  Laterality: Left;  necrotic,gangrene   IRRIGATION AND DEBRIDEMENT FOOT Left 12/09/2016   Procedure: IRRIGATION AND DEBRIDEMENT FOOT-MUSCLE/FASCIA, RESECTION OF FOURTH AND FIFTH METATARSAL NECROTIC BONE;  Surgeon: Recardo Evangelist, DPM;  Location: [facility] ORS;  Service: Podiatry;  Laterality: Left;   IRRIGATION AND DEBRIDEMENT FOOT Right 12/23/2018   Procedure: IRRIGATION AND DEBRIDEMENT FOOT and wound vac application;  Surgeon: Gwyneth Revels, DPM;  Location: [facility];  Service: Podiatry;  Laterality: Right;   LEFT HEART CATH N/A 06/25/2019   Procedure: Left Heart Cath and Coronary Angiography;  Surgeon: Marcina Millard, MD;  Location: [facility] INVASIVE CV LAB;  Service: Cardiovascular;  Laterality: N/A;   LEFT HEART CATH AND CORONARY ANGIOGRAPHY N/A 06/05/2018   Procedure: LEFT HEART CATH AND CORONARY ANGIOGRAPHY;  Surgeon: Alwyn Pea, MD;  Location: [facility] INVASIVE CV LAB;  Service: Cardiovascular;  Laterality: N/A;   LOWER EXTREMITY ANGIOGRAPHY Left 08/16/2016   Procedure: Lower Extremity Angiography;  Surgeon: Annice Needy, MD;  Location: [facility]VASIVE CV LAB;  Service: Cardiovascular;  Laterality: Left;   LOWER EXTREMITY ANGIOGRAPHY Left 09/01/2016   Procedure: Lower Extremity Angiography;  Surgeon: Annice Needy, MD;  Location: [facility] INVASIVE CV LAB;  Service: Cardiovascular;  Laterality: Left;   LOWER EXTREMITY ANGIOGRAPHY Left 10/14/2016   Procedure: Lower Extremity Angiography;  Surgeon: Annice Needy, MD;  Location: [facility] INVASIVE CV LAB;  Service: Cardiovascular;  Laterality: Left;   LOWER EXTREMITY ANGIOGRAPHY Left 12/20/2016   Procedure: Lower Extremity Angiography;  Surgeon: Annice Needy, MD;  Location: [facility] INVASIVE CV LAB;   Service: Cardiovascular;  Laterality: Left;   LOWER EXTREMITY ANGIOGRAPHY Right 12/18/2018   Procedure: LOWER EXTREMITY ANGIOGRAPHY;  Surgeon: Annice Needy, MD;  Location: [facility] INVASIVE CV LAB;  Service: Cardiovascular;  Laterality: Right;   LOWER EXTREMITY INTERVENTION  09/01/2016   Procedure: Lower Extremity Intervention;  Surgeon: Annice Needy, MD;  Location: [facility] INVASIVE CV LAB;  Service: Cardiovascular;;   PROSTATE SURGERY     removal   TONSILLECTOMY     as a child    (Not in a hospital admission)  Social History   Socioeconomic History   Marital status: Married    Spouse name: Not on file   Number of children: Not on file   Years of education: Not on file   Highest education level: Not on file  Occupational History   Occupation: retired  Tobacco Use   Smoking status: Former    Current packs/day: 0.00    Types: Cigarettes    Quit date: 10/14/1994    Years since quitting: 28.3    Passive exposure: Past   Smokeless tobacco: Never  Vaping Use   Vaping status: Never Used  Substance and Sexual Activity   Alcohol use: No    Alcohol/week: 0.0 standard drinks of alcohol   Drug use: No   Sexual activity: Not on file  Other Topics Concern   Not on file  Social History Narrative   Lives at home with wife, has a wheelchair   Social Determinants of Health   Financial Resource Strain: Low Risk  (01/31/2018)   Received from [facility], [facility]   Overall Financial Resource Strain (CARDIA)    Difficulty of Paying Living Expenses: Not very hard  Food Insecurity: No Food Insecurity (08/15/2022)   Hunger Vital Sign    Worried About Running Out of Food in the Last Year: Never true    Ran Out of Food in the Last Year: Never true  Transportation Needs: No Transportation Needs (08/15/2022)   PRAPARE - Administrator, Civil Service (Medical): No    Lack of Transportation (Non-Medical): No  Physical Activity: Not on file  Stress: Not  on file  Social Connections: Not on file  Intimate Partner Violence: Unknown (08/15/2022)   Humiliation, Afraid, Rape, and Kick questionnaire    Fear of Current or Ex-Partner: No    Emotionally Abused: No    Physically Abused: Not on file    Sexually Abused: No    Family History  Problem Relation Age of Onset   Hypertension Mother    Diabetes Mother    Prostate cancer Neg Hx    Bladder Cancer Neg Hx    Kidney disease Neg Hx      Vitals:   02/17/23 1345 02/17/23 1406 02/17/23 1415 02/17/23 1427  BP:    (!) 143/101  Pulse: (!) 38 (!) 108 (!) 102   Resp: 19  (!) 21   Temp:      TempSrc:      SpO2: 94%  91%   Weight:      Height:        PHYSICAL EXAM General: Chronically ill appearing, well nourished, in no acute distress. HEENT: Normocephalic and atraumatic. Neck: No JVD.  Lungs: Normal respiratory effort on 1L. Clear bilaterally to auscultation. No wheezes, crackles, rhonchi.  Heart: Irregularly irregular. Normal S1 and S2 without gallops or murmurs.  Abdomen: Non-distended appearing.  Msk: Normal strength and tone for age. Extremities: Warm and well perfused. No clubbing, cyanosis. S/p bilat BKA.  Neuro: Alert and oriented X 3. Psych: Answers questions appropriately.   Labs: Basic Metabolic Panel: Recent Labs    02/17/23 0813  NA 137  K 4.3  CL 102  CO2 23  GLUCOSE 298*  BUN 43*  CREATININE 1.90*  CALCIUM 8.4*   Liver Function Tests: Recent Labs    02/17/23 0813  AST 29  ALT 41  ALKPHOS 142*  BILITOT 1.0  PROT 7.0  ALBUMIN 3.1*   No results for input(s): "LIPASE", "AMYLASE" in the last 72 hours. CBC: Recent Labs    02/17/23 0813  WBC 9.5  NEUTROABS 6.8  HGB 12.6*  HCT 38.6*  MCV 91.3  PLT 247   Cardiac Enzymes: Recent Labs    02/17/23 0813 02/17/23 1017  TROPONINIHS 111* 116*   BNP: Recent Labs    02/17/23 0813  BNP 1,615.1*   D-Dimer: No results for input(s): "DDIMER" in the last 72 hours. Hemoglobin A1C: No results for  input(s): "HGBA1C" in the last 72 hours. Fasting Lipid Panel: No results for input(s): "CHOL", "HDL", "LDLCALC", "TRIG", "CHOLHDL", "LDLDIRECT" in the last 72 hours. Thyroid Function Tests: No results for input(s): "TSH", "T4TOTAL", "T3FREE", "THYROIDAB" in the last 72 hours.  Invalid input(s): "FREET3" Anemia Panel: No results for input(s): "VITAMINB12", "FOLATE", "FERRITIN", "TIBC", "IRON", "  RETICCTPCT" in the last 72 hours.   Radiology: [facility] Chest Port 1 View  Result Date: 02/17/2023 CLINICAL DATA:  Right chest pain and shortness of breath EXAM: PORTABLE CHEST 1 VIEW COMPARISON:  Chest radiograph 08/14/2022 FINDINGS: The heart is enlarged.  The upper mediastinal contours are normal. There is vascular congestion with possible mild pulmonary interstitial edema. There is no focal airspace consolidation. There is no pleural effusion or pneumothorax There is no acute osseous abnormality. IMPRESSION: Cardiomegaly with vascular congestion and possible mild pulmonary interstitial edema. Electronically Signed   By: Lesia Hausen M.D.   On: 02/17/2023 09:33    ECHO 07/2022:  1. Left ventricular ejection fraction, by estimation, is 35-40%. The left ventricle has moderately decreased function. The LV demonstrates regional wall motion abnormalities (see scoring diagram/findings for description). Left ventricular  diastolic parameters were normal.   2. Right ventricular systolic function is normal. The right ventricular  size is normal.   3. Left atrial size was mild to moderately dilated.   4. The mitral valve is normal in structure. Mild to moderate mitral valve  regurgitation. No evidence of mitral stenosis.   5. The aortic valve is normal in structure. Aortic valve regurgitation is  not visualized. No aortic stenosis is present.   6. The inferior vena cava is normal in size with greater than 50%  respiratory variability, suggesting right atrial pressure of 3 mmHg.   TELEMETRY reviewed by me 02/17/2023:  atrial fibrillation rate 90-100s  EKG reviewed by me: atrial fibrillation rate 113 bpm  Data reviewed by me 02/17/2023: last 24h vitals tele labs imaging I/O ED prover note, admission H&P  Principal Problem:   CHF exacerbation (HCC) Active Problems:   NSTEMI (non-ST elevated myocardial infarction) (HCC)   Acute respiratory failure with hypoxia (HCC)   PAF (paroxysmal atrial fibrillation) (HCC)   Chronic kidney disease, stage 3a (HCC)   Acute on chronic systolic CHF (congestive heart failure) (HCC)   Type II diabetes mellitus with renal manifestations (HCC)    ASSESSMENT AND PLAN:  [name] is a 88 y.o. male  with a past medical history of paroxysmal AF (Xarelto), chronic HFrEF (35-40%, rWMAs 07/2022), 1vCAD (CTO ostial LAD, 06/2019), CKD III, DM2, hx bilat BKA who presented to the ED on 02/17/2023 for chest pain and shortness of breath. Cardiology was consulted for further evaluation.   # Acute on chronic HFrEF (EF 35-40% in 07/2022) # Acute hypoxic respiratory failure Patient presenting with episode of chest pain and shortness of breath which resolved with SL nitroglycerin. Noted to be hypoxic in the 80s and placed on supplemental O2. BNP elevated to 1600 on admission. Troponins elevated but flat. He reports symptoms have now resolved.  -S/p IV lasix 40 mg in the ED. Will plan to continue tomorrow and reassess fluid status.  -Wean O2 as tolerated -Will restart home metoprolol. Plan to restart home jardiance when renal function improves. Consider further additions to GDMT as BP and renal function allow.   # Demand ischemia # Coronary artery disease  Patient with hx of CAD medically management with troponin elevation 111 > 116 on presentation in the setting of acute heart failure and acute respiratory failure.  -Troponin elevation most consistent with demand/supply mismatch and not ACS. -Continue home asa, atorvastatin.  -Continue imdur 30 mg daily.   # Paroxysmal atrial  fibrillation Patient in AF on presentation with rates in the 110s now improved to the 90s. -Restart home metoprolol for rate control. Avoid CCB given reduced  EF.  -Continue home xarelto 15 mg daily (reduced dose appropriate given renal function).  # Chronic kidney disease stage IIIa Cr 1.9 on admission, baseline between (1.5-2.1).  -Continue to monitor renal function with diuresis.  -Monitor and replenish electrolytes for a goal K >4, Mag >2    This patient's plan of care was discussed and created with Dr. Juliann Pares and he is in agreement.  Signed: Gale Journey, PA-C  02/17/2023, 2:44 PM [facility] Cardiology

## 2023-02-17 NOTE — ED Notes (Signed)
Pt was able to eat sandwich without issue. He is very somulent, however and it took much waking/encouraging of pt to take any other po including his medications.

## 2023-02-18 ENCOUNTER — Other Ambulatory Visit (HOSPITAL_COMMUNITY): Payer: Self-pay

## 2023-02-18 DIAGNOSIS — I509 Heart failure, unspecified: Secondary | ICD-10-CM | POA: Diagnosis not present

## 2023-02-18 LAB — CBC
HCT: 36 % — ABNORMAL LOW (ref 39.0–52.0)
Hemoglobin: 12 g/dL — ABNORMAL LOW (ref 13.0–17.0)
MCH: 30.2 pg (ref 26.0–34.0)
MCHC: 33.3 g/dL (ref 30.0–36.0)
MCV: 90.5 fL (ref 80.0–100.0)
Platelets: 244 10*3/uL (ref 150–400)
RBC: 3.98 MIL/uL — ABNORMAL LOW (ref 4.22–5.81)
RDW: 13.9 % (ref 11.5–15.5)
WBC: 8.1 10*3/uL (ref 4.0–10.5)
nRBC: 0 % (ref 0.0–0.2)

## 2023-02-18 LAB — GLUCOSE, CAPILLARY
Glucose-Capillary: 189 mg/dL — ABNORMAL HIGH (ref 70–99)
Glucose-Capillary: 181 mg/dL — ABNORMAL HIGH (ref 70–99)
Glucose-Capillary: 140 mg/dL — ABNORMAL HIGH (ref 70–99)
Glucose-Capillary: 155 mg/dL — ABNORMAL HIGH (ref 70–99)

## 2023-02-18 LAB — COMPREHENSIVE METABOLIC PANEL
ALT: 33 U/L (ref 0–44)
AST: 19 U/L (ref 15–41)
Albumin: 2.8 g/dL — ABNORMAL LOW (ref 3.5–5.0)
Alkaline Phosphatase: 115 U/L (ref 38–126)
Anion gap: 9 (ref 5–15)
BUN: 39 mg/dL — ABNORMAL HIGH (ref 8–23)
CO2: 26 mmol/L (ref 22–32)
Calcium: 8.3 mg/dL — ABNORMAL LOW (ref 8.9–10.3)
Chloride: 104 mmol/L (ref 98–111)
Creatinine, Ser: 1.8 mg/dL — ABNORMAL HIGH (ref 0.61–1.24)
GFR, Estimated: 36 mL/min — ABNORMAL LOW (ref >60)
Glucose, Bld: 182 mg/dL — ABNORMAL HIGH (ref 70–99)
Potassium: 3.9 mmol/L (ref 3.5–5.1)
Sodium: 139 mmol/L (ref 135–145)
Total Bilirubin: 1 mg/dL (ref 0.3–1.2)
Total Protein: 6 g/dL — ABNORMAL LOW (ref 6.5–8.1)

## 2023-02-18 LAB — HEMOGLOBIN A1C
Hgb A1c MFr Bld: 8.6 % — ABNORMAL HIGH (ref 4.8–5.6)
Mean Plasma Glucose: 200.12 mg/dL

## 2023-02-18 LAB — MAGNESIUM: Magnesium: 2 mg/dL (ref 1.7–2.4)

## 2023-02-18 LAB — PHOSPHORUS: Phosphorus: 3.8 mg/dL (ref 2.5–4.6)

## 2023-02-18 MED ORDER — MIRABEGRON ER 25 MG PO TB24
75.0000 mg | ORAL_TABLET | Freq: Every day | ORAL | Status: DC
  Administered 2023-02-18 – 2023-02-25 (×8): 75 mg via ORAL
  Filled 2023-02-18 (×8): qty 3

## 2023-02-18 MED ORDER — MEMANTINE HCL 10 MG PO TABS
10.0000 mg | ORAL_TABLET | Freq: Two times a day (BID) | ORAL | Status: DC
  Administered 2023-02-18 – 2023-02-25 (×15): 10 mg via ORAL
  Filled 2023-02-18 (×15): qty 1

## 2023-02-18 MED ORDER — ACETAMINOPHEN 325 MG PO TABS
650.0000 mg | ORAL_TABLET | Freq: Once | ORAL | Status: AC
  Administered 2023-02-18: 650 mg via ORAL
  Filled 2023-02-18: qty 2

## 2023-02-18 MED ORDER — TRIMETHOPRIM 100 MG PO TABS
100.0000 mg | ORAL_TABLET | Freq: Every day | ORAL | Status: DC
  Administered 2023-02-18 – 2023-02-19 (×2): 100 mg via ORAL
  Filled 2023-02-18 (×3): qty 1

## 2023-02-18 MED ORDER — METOPROLOL SUCCINATE ER 50 MG PO TB24
50.0000 mg | ORAL_TABLET | Freq: Every day | ORAL | Status: DC
  Administered 2023-02-18 – 2023-02-20 (×3): 50 mg via ORAL
  Filled 2023-02-18 (×3): qty 1

## 2023-02-18 MED ORDER — FLUOXETINE HCL 20 MG PO CAPS
60.0000 mg | ORAL_CAPSULE | Freq: Every day | ORAL | Status: DC
  Administered 2023-02-18 – 2023-02-25 (×8): 60 mg via ORAL
  Filled 2023-02-18 (×8): qty 3

## 2023-02-18 MED ORDER — ARFORMOTEROL TARTRATE 15 MCG/2ML IN NEBU
15.0000 ug | INHALATION_SOLUTION | Freq: Two times a day (BID) | RESPIRATORY_TRACT | Status: DC
  Administered 2023-02-18 – 2023-02-20 (×4): 15 ug via RESPIRATORY_TRACT
  Filled 2023-02-18 (×5): qty 2

## 2023-02-18 MED ORDER — BUDESONIDE 0.25 MG/2ML IN SUSP
0.2500 mg | Freq: Two times a day (BID) | RESPIRATORY_TRACT | Status: DC
  Administered 2023-02-18 – 2023-02-20 (×5): 0.25 mg via RESPIRATORY_TRACT
  Filled 2023-02-18 (×5): qty 2

## 2023-02-18 MED ORDER — DIVALPROEX SODIUM 500 MG PO DR TAB
500.0000 mg | DELAYED_RELEASE_TABLET | Freq: Every day | ORAL | Status: DC
  Administered 2023-02-18 – 2023-02-25 (×8): 500 mg via ORAL
  Filled 2023-02-18 (×8): qty 1

## 2023-02-18 NOTE — Discharge Instructions (Signed)

## 2023-02-18 NOTE — TOC Initial Note (Addendum)
Transition of Care [facility]) - Initial/Assessment Note    Patient Details  Name: [name] MRN: [id number] Date of Birth: 12-03-1934  Transition of Care [facility]) CM/SW Contact:    Margarito Liner, LCSW Phone Number: 02/18/2023, 10:03 AM  Clinical Narrative:  Per chart review, patient is from [facility]. CSW called and spoke to the secretary who confirmed he is a resident on their ALF side. Patient receives home health therapy through their in-house provider. Patient has a RW and wheelchair at the facility but primarily uses the wheelchair. Per chart review, patient is not on oxygen at baseline so will follow for this potential need. At discharge, will need to fax discharge paperwork to [facility] at [phone].        11:38 am: Received notification from [facility] liaison that they are providing patient's PT and OT, not the ALF in-house agency.        Expected Discharge Plan: Assisted Living (with home health therapy) Barriers to Discharge: Continued Medical Work up   Patient Goals and CMS Choice            Expected Discharge Plan and Services       Living arrangements for the past 2 months: Assisted Living Facility                                      Prior Living Arrangements/Services Living arrangements for the past 2 months: Assisted Living Facility Lives with:: Facility Resident Patient language and need for interpreter reviewed:: Yes Do you feel safe going back to the place where you live?: Yes      Need for Family Participation in Patient Care: Yes (Comment) Care giver support system in place?: Yes (comment) Current home services: Home PT, DME Criminal Activity/Legal Involvement Pertinent to Current Situation/Hospitalization: No - Comment as needed  Activities of Daily Living      Permission Sought/Granted Permission sought to share information with : Photographer granted to share info w AGENCY:  [facility]        Emotional Assessment Appearance:: Appears stated age Attitude/Demeanor/Rapport: Unable to Assess Affect (typically observed): Unable to Assess Orientation: : Oriented to Self Alcohol / Substance Use: Not Applicable Psych Involvement: No (comment)  Admission diagnosis:  Acute pulmonary edema (HCC) [J81.0] CHF exacerbation (HCC) [I50.9] Acute respiratory failure with hypoxia (HCC) [J96.01] Patient Active Problem List   Diagnosis Date Noted   CHF exacerbation (HCC) 02/17/2023   Acute on chronic systolic CHF (congestive heart failure) (HCC) 08/14/2022   HTN (hypertension) 08/14/2022   Depression 08/14/2022   Type II diabetes mellitus with renal manifestations (HCC) 08/14/2022   Frequent UTI 04/21/2021   Mood disorder (HCC) 04/21/2021   Chronic systolic CHF (congestive heart failure) (HCC) 03/06/2021   Hyperglycemia due to type 2 diabetes mellitus (HCC) 11/10/2020   GERD (gastroesophageal reflux disease) 11/10/2020   Chest pain 11/09/2020   Elevated troponin I level 09/19/2020   Acute encephalopathy 07/29/2020   Lactic acidosis 07/28/2020   Hypoxic 07/28/2020   Chronic prescription opiate use 07/28/2020   Elevated troponin 07/28/2020   Chronic anticoagulation 06/25/2020   Chronic kidney disease, stage 3a (HCC) 06/25/2020   AMS (altered mental status) 04/08/2020   CAD (coronary artery disease) 03/06/2020   Personal history of COVID-19 03/06/2020   Acute toxic-metabolic encephalopathy 03/06/2020   UTI (urinary tract infection) 03/06/2020   (HFpEF)  heart failure with preserved ejection fraction (HCC) 03/06/2020   Goals of care, counseling/discussion    Palliative care by specialist    Seizure disorder (HCC) 11/30/2019   ACS (acute coronary syndrome) (HCC) 06/24/2019   Sinus bradycardia on ECG 05/16/2019   Bradycardia 05/16/2019   PAF (paroxysmal atrial fibrillation) (HCC) 03/27/2019   Hx of BKA, right (HCC) 03/02/2019   Severe protein-calorie  malnutrition (HCC) 02/16/2019   Acute systolic CHF (congestive heart failure) (HCC) 02/16/2019   Acute respiratory failure with hypoxia (HCC) 02/08/2019   Pneumonia due to COVID-19 virus 02/08/2019   Below-knee amputation of right lower extremity (HCC) 01/15/2019   Polyp of descending colon    Iron deficiency anemia due to chronic blood loss    Sacral decubitus ulcer, stage II (HCC) 12/26/2018   Acute respiratory failure (HCC) 12/20/2018   Femur fracture (HCC) 10/13/2018   Nonspecific chest pain 06/03/2018   Chest pain, unspecified 06/03/2018   History of arterial ischemic stroke 03/21/2018   Hypothyroidism, acquired, autoimmune 02/21/2018   Hyperlipidemia associated with type 2 diabetes mellitus (HCC) 02/21/2018   NSTEMI (non-ST elevated myocardial infarction) (HCC) 01/16/2018   Acute CHF (congestive heart failure) (HCC) 11/23/2017   Atrial fibrillation with rapid ventricular response (HCC) 10/15/2017   Seizures (HCC) 08/18/2017   BKA stump complication (HCC) 02/10/2017   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 (HCC) 02/10/2017   S/P bilateral BKA (below knee amputation) (HCC) 02/01/2017   Chronic diastolic heart failure (HCC) 01/21/2017   Pressure injury of skin 01/18/2017   Atherosclerosis of artery of extremity with gangrene (HCC) 01/05/2017   Diabetic foot ulcer (HCC) 12/29/2016   Ataxia 10/21/2016   Atherosclerosis of native arteries of the extremities with ulceration (HCC) 10/12/2016   History of femoral angiogram 09/06/2016   Left leg pain 09/06/2016   Generalized weakness 09/06/2016   AKI (acute kidney injury) (HCC) 09/06/2016   Atrial fibrillation, chronic (HCC) 08/30/2016   Pneumonia 05/19/2016   Hyperlipidemia 04/20/2016   Back pain 04/19/2016   DM (diabetes mellitus), type 2 with complications (HCC) 04/19/2016   Primary hypertension 04/19/2016   PAD (peripheral artery disease) (HCC) 04/19/2016   Erectile dysfunction following radical prostatectomy  06/25/2015   History of prostate cancer 12/23/2014   Incontinence 12/23/2014   PCP:  Luciana Axe, NP Pharmacy:   [facility] [phone] - [place], [place] - [address] [place] [address] Phone: [phone] Fax: [phone]     Social Determinants of Health (SDOH) Social History: SDOH Screenings   Food Insecurity: No Food Insecurity (08/15/2022)  Housing: Low Risk  (08/15/2022)  Transportation Needs: No Transportation Needs (08/15/2022)  Utilities: Not At Risk (08/15/2022)  Financial Resource Strain: Low Risk  (01/31/2018)   Received from [facility], [facility]  Tobacco Use: Medium Risk (01/18/2023)   Received from [facility]   SDOH Interventions:     Readmission Risk Interventions    02/18/2023   10:00 AM  Readmission Risk Prevention Plan  Transportation Screening Complete  PCP or Specialist Appt within 3-5 Days Complete  HRI or Home Care Consult Complete  Social Work Consult for Recovery Care Planning/Counseling Complete  Palliative Care Screening Not Applicable  Medication Review Oceanographer) Complete

## 2023-02-18 NOTE — Progress Notes (Signed)
[facility] Progress Note  Patient: [name]    ZOX:[id number]  DOA: 02/17/2023     Date of Service: the patient was seen and examined on 02/18/2023  Chief Complaint  Patient presents with   Chest Pain    Resident of [facility], was sent here this morning for chest pain and shortness of breath that began when he woke up; Was given NTG SL x 1 with relief; Upon arrival, patient denies chest pain and states that his shortness of breath has improved; Does not wear O2 at home, 87% on RA upon arrival   Brief hospital course: Terron Merfeld is a 88 y.o. male with medical history significant of HFrEF, atrial fibrillation, stage III CKD, hypertension, hyperlipidemia, type 2 diabetes presenting with acute respiratory failure with hypoxia, acute on chronic HFrEF.  Limited history in the setting of age and dementia.  History primarily from patient's son [name] ([id number]) who is also HCPOA.  Per report, patient with increased work of breathing cough over 1 to 2 days.  No reported fevers or chills.  No reported nausea or vomiting.  Per report, has been compliant with diuretic regimen at facility.  No reported chest pain.  No focal hemiparesis reported. Presented to the ER afebrile, hemodynamically stable.  Satting in the mid 80s on room air, transition to 2 L nasal cannula to keep O2 sats greater than 94%.  White count 9.5, hemoglobin 12.6, platelets 247, troponin 1 11-1 16, COVID flu and RSV negative, creatinine 1.9.  BNP 1600.  Chest x-ray with cardiomegaly and interstitial pulmonary edema.   Assessment and Plan: Acute on chronic systolic CHF (congestive heart failure) (HCC) 2D echo March 2024 with EF of 35-40% as well as moderate mitral regurgitation Positive acute volume overload with noted BNP of 1600 as well as pulmonary edema chest x-ray Status post IV Lasix Continue to follow volume status with diuresis Continue GDMT Cardiology formally consulted Follow  recommendations   Acute respiratory failure with hypoxia (HCC) Decompensated respiratory status now requiring 2 L nasal cannula in the setting of acute on chronic HFrEF BNP 1600 with noted pulmonary edema on chest x-ray No overt infection noted IV Lasix Strict ins and outs and daily weights Continue supplemental oxygen Monitor respiratory status and volume status closely 9/27, started nebulizer treatment twice daily   Chronic kidney disease, stage 3a (HCC) Baseline creatinine 1.5-2.1 with GFR in the 30s to 40s Creatinine 1.9 today with GFR in the 30s Monitor renal function with diuresis   Type II diabetes mellitus with renal manifestations (HCC) SSI A1c   PAF (paroxysmal atrial fibrillation) (HCC) Baseline atrial fibrillation EKG atrial flutter with rate in the 100s Titrate rate controlling regimen Continue Xarelto Follow-up cardiology recommendations   NSTEMI (non-ST elevated myocardial infarction) (HCC) Troponin 100s without active chest pain in setting of acute on chronic HFrEF EKG stable Likely secondary demand ischemia Continue home regimen including baby aspirin Follow-up cardiology recommendations   Body mass index is 53.64 kg/m.  Interventions:  Diet: Heart healthy carb modified diet DVT Prophylaxis: Therapeutic Anticoagulation with Xarelto    Advance goals of care discussion: DNR/DNI  Family Communication: family was not present at bedside, at the time of interview.  The pt provided permission to discuss medical plan with the family. Opportunity was given to ask question and all questions were answered satisfactorily.   Disposition:  Pt is Resident of [facility] , admitted with CHF, still has SOB, which precludes a safe discharge. Discharge back to [facility],  when stable and cleared by cardiology.  Subjective: No significant events overnight, patient still has shortness of breath but feels improvement, any chest pain or palpitations.  Physical  Exam: General: NAD, lying comfortably Appear in no distress, affect appropriate Eyes: PERRLA ENT: Oral Mucosa Clear, moist  Neck: no JVD,  Cardiovascular: S1 and S2 Present, no Murmur,  Respiratory: good respiratory effort, Bilateral Air entry equal and Decreased, mild crackles, and mild wheezing Abdomen: Bowel Sound present, Soft and no tenderness,  Skin: no rashes Extremities: b/p BKA, no edema  Neurologic: without any new focal findings Gait not checked due to patient safety concerns  Vitals:   02/18/23 0004 02/18/23 0504 02/18/23 0728 02/18/23 1105  BP: 134/89 117/89 111/81 (!) 139/94  Pulse: 89 (!) 104 (!) 109 (!) 108  Resp: 18 18 18 20   Temp: (!) 97.5 F (36.4 C) 98.3 F (36.8 C) 97.6 F (36.4 C) 97.9 F (36.6 C)  TempSrc: Oral     SpO2: 94% 96% 98% 94%  Weight:      Height:        Intake/Output Summary (Last 24 hours) at 02/18/2023 1313 Last data filed at 02/18/2023 0900 Gross per 24 hour  Intake 0 ml  Output 500 ml  Net -500 ml   Filed Weights   02/17/23 0811  Weight: 97.2 kg    Data Reviewed: I have personally reviewed and interpreted daily labs, tele strips, imagings as discussed above. I reviewed all nursing notes, pharmacy notes, vitals, pertinent old records I have discussed plan of care as described above with RN and patient/family.  CBC: Recent Labs  Lab 02/17/23 0813 02/18/23 0306  WBC 9.5 8.1  NEUTROABS 6.8  --   HGB 12.6* 12.0*  HCT 38.6* 36.0*  MCV 91.3 90.5  PLT 247 244   Basic Metabolic Panel: Recent Labs  Lab 02/17/23 0813 02/18/23 0306  NA 137 139  K 4.3 3.9  CL 102 104  CO2 23 26  GLUCOSE 298* 182*  BUN 43* 39*  CREATININE 1.90* 1.80*  CALCIUM 8.4* 8.3*  MG  --  2.0  PHOS  --  3.8    Studies: No results found.  Scheduled Meds:  aspirin EC  81 mg Oral Daily   atorvastatin  80 mg Oral QHS   divalproex  500 mg Oral Daily   FLUoxetine  60 mg Oral Daily   insulin aspart  0-15 Units Subcutaneous TID WC   insulin  aspart  0-5 Units Subcutaneous QHS   insulin aspart  4 Units Subcutaneous TID WC   isosorbide mononitrate  60 mg Oral Daily   memantine  10 mg Oral BID   metoprolol succinate  50 mg Oral Daily   mirabegron ER  75 mg Oral Daily   Rivaroxaban  15 mg Oral Q supper   sodium chloride flush  3 mL Intravenous Q12H   trimethoprim  100 mg Oral Daily   Continuous Infusions:  sodium chloride     PRN Meds: sodium chloride, ondansetron **OR** ondansetron (ZOFRAN) IV, sodium chloride flush  Time spent: 35 minutes  Author: Gillis Santa. MD [facility] 02/18/2023 1:13 PM  To reach On-call, see care teams to locate the attending and reach out to them via [email or web address]. If 7PM-7AM, please contact night-coverage If you still have difficulty reaching the attending provider, please page the [facility] (Director on Call) for [facility] on amion for assistance.

## 2023-02-18 NOTE — Plan of Care (Signed)
  Problem: Activity: Goal: Capacity to carry out activities will improve Outcome: Progressing   Problem: Health Behavior/Discharge Planning: Goal: Ability to manage health-related needs will improve Outcome: Progressing   Problem: Activity: Goal: Risk for activity intolerance will decrease Outcome: Progressing   Problem: Nutrition: Goal: Adequate nutrition will be maintained Outcome: Progressing   Problem: Safety: Goal: Ability to remain free from injury will improve Outcome: Progressing

## 2023-02-18 NOTE — TOC Benefit Eligibility Note (Signed)
Patient Product/process development scientist completed.    The patient is insured through [facility]. Patient has [employer], may use a copay card, and/or apply for patient assistance if available.    Ran test claim for Entresto 24-26 mg and the current 30 day co-pay is $60.00.   This test claim was processed through [facility]- copay amounts may vary at other pharmacies due to pharmacy/plan contracts, or as the patient moves through the different stages of their insurance plan.     Roland Earl, CPHT Pharmacy Technician III Certified Patient Advocate [facility] Patient Advocate Team Direct Number: [phone]  Fax: [phone]

## 2023-02-18 NOTE — Progress Notes (Signed)
[facility] HF Stewardship  PCP: Luciana Axe, NP  PCP-Cardiologist: None  HPI: [name] is a 88 y.o. male with HFrEF, atrial fibrillation, stage III CKD, hypertension, hyperlipidemia, type 2 diabetes, PAD s/p bilateral BKA, AF,   who presented with shortness of breath and chest pain. Troponin on admission was 111. BNP on admission was elevated at 1615.1. Most recent LVEF in 07/2022 was 35-40%. LHC in 2020 showing distal LM to Ost LAD 100% stenosis, Ost RCA to Prox RCA 75% stenosis, and prox Cx 50% stenosis.  Pertinent Lab Values: Creatinine  Date Value Ref Range Status  01/20/2013 1.09 0.60 - 1.30 mg/dL Final   Creatinine, Ser  Date Value Ref Range Status  02/18/2023 1.80 (H) 0.61 - 1.24 mg/dL Final   BUN  Date Value Ref Range Status  02/18/2023 39 (H) 8 - 23 mg/dL Final  86/57/8469 22 (H) 7 - 18 mg/dL Final   Potassium  Date Value Ref Range Status  02/18/2023 3.9 3.5 - 5.1 mmol/L Final  01/20/2013 3.5 3.5 - 5.1 mmol/L Final   Sodium  Date Value Ref Range Status  02/18/2023 139 135 - 145 mmol/L Final  01/20/2013 140 136 - 145 mmol/L Final   B Natriuretic Peptide  Date Value Ref Range Status  02/17/2023 1,615.1 (H) 0.0 - 100.0 pg/mL Final    Comment:    Performed at [facility], [address] [place], [place] [address]   Magnesium  Date Value Ref Range Status  02/18/2023 2.0 1.7 - 2.4 mg/dL Final    Comment:    Performed at [facility], [address] [place], [place] [address]   Hgb A1c MFr Bld  Date Value Ref Range Status  03/06/2021 12.4 (H) 4.8 - 5.6 % Final    Comment:    (NOTE)         Prediabetes: 5.7 - 6.4         Diabetes: >6.4         Glycemic control for adults with diabetes: <7.0    Digoxin Level  Date Value Ref Range Status  01/16/2018 0.4 (L) 0.8 - 2.0 ng/mL Final    Comment:    Performed at [facility], [address] [place], [place] [address]   TSH  Date Value Ref Range Status  04/17/2021  2.413 0.350 - 4.500 uIU/mL Final    Comment:    Performed by a 3rd Generation assay with a functional sensitivity of <=0.01 uIU/mL. Performed at [facility], [address] [place], [place] [address]    LDH  Date Value Ref Range Status  10/22/2016 172 98 - 192 U/L Final    Vital Signs: Temp:  [97.4 F (36.3 C)-98.3 F (36.8 C)] 97.6 F (36.4 C) (09/27 0728) Pulse Rate:  [38-116] 109 (09/27 0728) Cardiac Rhythm: Atrial fibrillation (09/27 0700) Resp:  [11-27] 18 (09/27 0728) BP: (104-156)/(80-111) 111/81 (09/27 0728) SpO2:  [75 %-99 %] 98 % (09/27 0728)   Intake/Output Summary (Last 24 hours) at 02/18/2023 0823 Last data filed at 02/18/2023 0504 Gross per 24 hour  Intake --  Output 1500 ml  Net -1500 ml    Current Inpatient Medications:  -Metoprolol tartrate 25 mg BID -Furosemide 40 mg IV x 1 -Other vasoactive medications include isosorbide 60 mg daily  Prior to admission HF Medications:  -Metoprolol tartrate 25 mg BID -Furosemide 40 mg daily -Other vasoactive medications include isosorbide 60 mg daily  Assessment: 1. Acute on chronic systolic heart failure (LVEF 35-40%),  due to presumed ICM. NYHA class III symptoms.  -PTA BB is not one with proven mortality benefit. Consider swapping to metoprolol succinate. -Renal function not ideal for spironolactone. May be candidate for finereone outpatient.  -Not a candiate for SGLT2i given frequent UTIs and incontinence on chronic trimethoprim.  -Renal function currently not ideal for ARB/ARNI, but if renal function becomes stable can consider adding before discharge.due to elevated BP. -Agree with IV furosemide today. Will need to be reentered tomorrow if continued diuresis is needed as today's dose was one time only.  Plan: 1) Medication changes recommended at this time: -Agree with transitioning to metoprolol succinate  2) Patient assistance: -Entresto copay $60  3) Education: -To be completed prior to  discharge.  Medication Assistance / Insurance Benefits Check:  Does the patient have prescription insurance?  [employer]  Type of insurance plan:   Does the patient qualify for medication assistance through manufacturers or grants? Pending   Outpatient Pharmacy[facility]  [facility]     Thank you for involving pharmacy in this patient's care.  Enos Fling, PharmD, BCPS Phone - [phone] Clinical Pharmacist 02/18/2023 11:17 AM

## 2023-02-18 NOTE — Progress Notes (Signed)
[facility] CLINIC CARDIOLOGY CONSULT NOTE       Patient ID: [name] MRN: [id number] DOB/AGE: Jul 29, 1934 88 y.o.  Admit date: 02/17/2023 Referring Physician Dr. Doree Albee Primary Physician Laren Everts, NP Primary Cardiologist Marijo Conception, NP Reason for Consultation AoCHF  HPI: [name] is a 88 y.o. male  with a past medical history of paroxysmal AF (Xarelto), chronic HFrEF (35-40%, rWMAs 07/2022), 1vCAD (CTO ostial LAD, 06/2019), CKD III, DM2, hx bilat BKA who presented to the ED on 02/17/2023 for chest pain and shortness of breath. Cardiology was consulted for further evaluation.   Interval history: -Patient resting comfortably this AM, he is without complaints.  -States SOB has improved, remains on supplemental O2.  -1.5 L UOP since admission. Renal function improved on AM labs.   Review of systems complete and found to be negative unless listed above    Past Medical History:  Diagnosis Date   Acute respiratory failure with hypoxia (HCC) 02/08/2019   Arthritis    Atrial fibrillation (HCC)    Carcinoma of prostate (HCC)    CHF (congestive heart failure) (HCC)    Chronic kidney disease    Diabetes mellitus without complication (HCC)    ED (erectile dysfunction)    Frequent urination    Hyperlipidemia    Hypertension    Leukocytosis 09/06/2016   Moderate mitral insufficiency    Myocardial injury 08/14/2022   Peripheral vascular disease (HCC)    Pneumonia 04/2016   Prostate cancer (HCC)    Sleep apnea    OSA--USE C-PAP   Ulcer of left foot due to type 2 diabetes mellitus (HCC)    Urinary stress incontinence, male     Past Surgical History:  Procedure Laterality Date   AMPUTATION Left 01/12/2017   Procedure: AMPUTATION BELOW KNEE;  Surgeon: Annice Needy, MD;  Location: [facility] ORS;  Service: General;  Laterality: Left;   AMPUTATION Right 12/27/2018   Procedure: AMPUTATION BELOW KNEE;  Surgeon: Annice Needy, MD;  Location: [facility] ORS;  Service:  General;  Laterality: Right;   APLIGRAFT PLACEMENT Left 10/15/2016   Procedure: APLIGRAFT PLACEMENT;  Surgeon: Recardo Evangelist, DPM;  Location: [facility] ORS;  Service: Podiatry;  Laterality: Left;  necrotic ulcer   CHOLECYSTECTOMY     COLONOSCOPY WITH PROPOFOL N/A 01/02/2019   Procedure: COLONOSCOPY WITH PROPOFOL;  Surgeon: Midge Minium, MD;  Location: [facility] ENDOSCOPY;  Service: Endoscopy;  Laterality: N/A;   COLONOSCOPY WITH PROPOFOL N/A 01/05/2019   Procedure: COLONOSCOPY WITH PROPOFOL;  Surgeon: Midge Minium, MD;  Location: [facility] ENDOSCOPY;  Service: Endoscopy;  Laterality: N/A;   ESOPHAGOGASTRODUODENOSCOPY (EGD) WITH PROPOFOL N/A 01/02/2019   Procedure: ESOPHAGOGASTRODUODENOSCOPY (EGD) WITH PROPOFOL;  Surgeon: Midge Minium, MD;  Location: [facility] ENDOSCOPY;  Service: Endoscopy;  Laterality: N/A;   EYE SURGERY Bilateral    Cataract Extraction with IOL   HIP ARTHROPLASTY Right 10/13/2018   Procedure: ARTHROPLASTY BIPOLAR HIP (HEMIARTHROPLASTY);  Surgeon: Deeann Saint, MD;  Location: [facility] OR[facility];  Service: Orthopedics;  Laterality: Right;   IRRIGATION AND DEBRIDEMENT FOOT Left 10/15/2016   Procedure: IRRIGATION AND DEBRIDEMENT FOOT-EXCISIONAL DEBRIDEMENT OF SKIN 3RD, 4TH AND 5TH TOES WITH APPLICATION OF APLIGRAFT;  Surgeon: Recardo Evangelist, DPM;  Location: [facility] ORS;  Service: Podiatry;  Laterality: Left;  necrotic,gangrene   IRRIGATION AND DEBRIDEMENT FOOT Left 12/09/2016   Procedure: IRRIGATION AND DEBRIDEMENT FOOT-MUSCLE/FASCIA, RESECTION OF FOURTH AND FIFTH METATARSAL NECROTIC BONE;  Surgeon: Recardo Evangelist, DPM;  Location: [facility];  Service: Podiatry;  Laterality: Left;   IRRIGATION AND DEBRIDEMENT FOOT  Right 12/23/2018   Procedure: IRRIGATION AND DEBRIDEMENT FOOT and wound vac application;  Surgeon: Gwyneth Revels, DPM;  Location: [facility];  Service: Podiatry;  Laterality: Right;   LEFT HEART CATH N/A 06/25/2019   Procedure: Left Heart Cath and Coronary Angiography;  Surgeon: Marcina Millard, MD;   Location: [facility] INVASIVE CV LAB;  Service: Cardiovascular;  Laterality: N/A;   LEFT HEART CATH AND CORONARY ANGIOGRAPHY N/A 06/05/2018   Procedure: LEFT HEART CATH AND CORONARY ANGIOGRAPHY;  Surgeon: Alwyn Pea, MD;  Location: [facility] INV[facility] CV LAB;  Service: Cardiovascular;  Laterality: N/A;   LOWER EXTREMITY ANGIOGRAPHY Left 08/16/2016   Procedure: Lower Extremity Angiography;  Surgeon: Annice Needy, MD;  Location: [facility];  Service: Cardiovascular;  Laterality: Left;   LOWER EXTREMITY ANGIOGRAPHY Left 09/01/2016   Procedure: Lower Extremity Angiography;  Surgeon: Annice Needy, MD;  Location: [facility];  Service: Cardiovascular;  Laterality: Left;   LOWER EXTREMITY ANGIOGRAPHY Left 10/14/2016   Procedure: Lower Extremity Angiography;  Surgeon: Annice Needy, MD;  Location: [facility];  Service: Cardiovascular;  Laterality: Left;   LOWER EXTREMITY ANGIOGRAPHY Left 12/20/2016   Procedure: Lower Extremity Angiography;  Surgeon: Annice Needy, MD;  Location: [facility];  Service: Cardiovascular;  Laterality: Left;   LOWER EXTREMITY ANGIOGRAPHY Right 12/18/2018   Procedure: LOWER EXTREMITY ANGIOGRAPHY;  Surgeon: Annice Needy, MD;  Location: [facility];  Service: Cardiovascular;  Laterality: Right;   LOWER EXTREMITY INTERVENTION  09/01/2016   Procedure: Lower Extremity Intervention;  Surgeon: Annice Needy, MD;  Location: [facility] CV LAB;  Service: Cardiovascular;;   PROSTATE SURGERY     removal   TONSILLECTOMY     as a child    Medications Prior to Admission  Medication Sig Dispense Refill Last Dose   aspirin 81 MG EC tablet Take 81 mg by mouth daily.   02/17/2023 at 0617   atorvastatin (LIPITOR) 80 MG tablet Take 80 mg by mouth at bedtime.   02/16/2023 at 1823   bisacodyl (DULCOLAX) 5 MG EC tablet Take 5 mg by mouth daily as needed for moderate constipation.   prn at unk   divalproex (DEPAKOTE) 500 MG DR tablet Take 500 mg by mouth daily.     02/17/2023 at 0617   ferrous sulfate 325 (65 FE) MG tablet Take 325 mg by mouth daily with breakfast.   02/16/2023 at 0917   FLUoxetine HCl 60 MG TABS Take 1 tablet by mouth daily.   02/17/2023 at 0617   furosemide (LASIX) 40 MG tablet Take 1 tablet (40 mg total) by mouth daily. 30 tablet 1 02/17/2023 at 0617   isosorbide mononitrate (IMDUR) 30 MG 24 hr tablet Take 60 mg by mouth daily.   02/17/2023 at 0617   LANTUS 100 UNIT/ML injection Inject 45 Units into the skin at bedtime.   02/16/2023 at 1822   memantine (NAMENDA) 10 MG tablet Take 10 mg by mouth 2 (two) times daily.   02/17/2023 at 0617   metoprolol tartrate (LOPRESSOR) 25 MG tablet Take 25 mg by mouth 2 (two) times daily.   02/17/2023 at 0617   Multiple Vitamin (MULTIVITAMIN) tablet Take 1 tablet by mouth daily.   02/17/2023 at 0617   nitroGLYCERIN (NITROSTAT) 0.4 MG SL tablet Place 1 tablet (0.4 mg total) under the tongue every 5 (five) minutes as needed for chest pain. 20 tablet 12 prn at unk   NOVOLOG FLEXPEN 100 UNIT/ML FlexPen Inject 6  Units into the skin 3 (three) times daily with meals. (Patient taking differently: Inject 6-8 Units into the skin 3 (three) times daily with meals. 6 units at breakfast and dinner and 8 units at lunch) 15 mL 0 02/16/2023 at 1622   Rivaroxaban (XARELTO) 15 MG TABS tablet Take 1 tablet (15 mg total) by mouth daily with supper. Hold Xarelto for 3 days.  Start on January 09, 2019. 30 tablet 1 02/16/2023 at 1833   trimethoprim (TRIMPEX) 100 MG tablet Take 100 mg by mouth daily.   02/17/2023 at 0617   Vibegron (GEMTESA) 75 MG TABS Take 1 tablet (75 mg total) by mouth daily. 30 tablet 11 02/17/2023 at 0617   Ensure Max Protein (ENSURE MAX PROTEIN) LIQD Take 330 mLs (11 oz total) by mouth 2 (two) times daily. (Patient taking differently: Take 11 oz by mouth daily.)      FLUoxetine (PROZAC) 40 MG capsule Take 40 mg by mouth daily. (Patient not taking: Reported on 02/17/2023)   Not Taking   Insulin Glargine (BASAGLAR KWIKPEN) 100  UNIT/ML Inject 25 Units into the skin daily. (Resume usual home dose if sugars are elevated) (Patient not taking: Reported on 02/17/2023)   Not Taking   polycarbophil (FIBERCON) 625 MG tablet Take 625 mg by mouth daily. (Patient not taking: Reported on 02/17/2023)   Not Taking   Social History   Socioeconomic History   Marital status: Married    Spouse name: Not on file   Number of children: Not on file   Years of education: Not on file   Highest education level: Not on file  Occupational History   Occupation: retired  Tobacco Use   Smoking status: Former    Current packs/day: 0.00    Types: Cigarettes    Quit date: 10/14/1994    Years since quitting: 28.3    Passive exposure: Past   Smokeless tobacco: Never  Vaping Use   Vaping status: Never Used  Substance and Sexual Activity   Alcohol use: No    Alcohol/week: 0.0 standard drinks of alcohol   Drug use: No   Sexual activity: Not on file  Other Topics Concern   Not on file  Social History Narrative   Lives at home with wife, has a wheelchair   Social Determinants of Health   Financial Resource Strain: Low Risk  (01/31/2018)   Received from [facility]   Overall Financial Resource Strain (CARDIA)    Difficulty of Paying Living Expenses: Not very hard  Food Insecurity: No Food Insecurity (08/15/2022)   Hunger Vital Sign    Worried About Running Out of Food in the Last Year: Never true    Ran Out of Food in the Last Year: Never true  Transportation Needs: No Transportation Needs (08/15/2022)   PRAPARE - Administrator, Civil Service (Medical): No    Lack of Transportation (Non-Medical): No  Physical Activity: Not on file  Stress: Not on file  Social Connections: Not on file  Intimate Partner Violence: Unknown (08/15/2022)   Humiliation, Afraid, Rape, and Kick questionnaire    Fear of Current or Ex-Partner: No    Emotionally Abused: No    Physically Abused: Not on  file    Sexually Abused: No    Family History  Problem Relation Age of Onset   Hypertension Mother    Diabetes Mother    Prostate cancer Neg Hx    Bladder Cancer Neg Hx  Kidney disease Neg Hx      Vitals:   02/17/23 1900 02/18/23 0004 02/18/23 0504 02/18/23 0728  BP: (!) 155/84 134/89 117/89 111/81  Pulse: 100 89 (!) 104 (!) 109  Resp: 17 18 18 18   Temp: (!) 97.4 F (36.3 C) (!) 97.5 F (36.4 C) 98.3 F (36.8 C) 97.6 F (36.4 C)  TempSrc: Oral Oral    SpO2: 99% 94% 96% 98%  Weight:      Height:        PHYSICAL EXAM General: Chronically ill appearing, well nourished, in no acute distress resting comfortably in hospital bed. HEENT: Normocephalic and atraumatic. Neck: No JVD.  Lungs: Normal respiratory effort on 4L. Clear bilaterally to auscultation. No wheezes, crackles, rhonchi.  Heart: Irregularly irregular. Normal S1 and S2 without gallops or murmurs.  Abdomen: Non-distended appearing.  Msk: Normal strength and tone for age. Extremities: Warm and well perfused. No clubbing, cyanosis. S/p bilat BKA.  Neuro: Alert and oriented X 3. Psych: Answers questions appropriately.   Labs: Basic Metabolic Panel: Recent Labs    02/17/23 0813 02/18/23 0306  NA 137 139  K 4.3 3.9  CL 102 104  CO2 23 26  GLUCOSE 298* 182*  BUN 43* 39*  CREATININE 1.90* 1.80*  CALCIUM 8.4* 8.3*  MG  --  2.0  PHOS  --  3.8   Liver Function Tests: Recent Labs    02/17/23 0813 02/18/23 0306  AST 29 19  ALT 41 33  ALKPHOS 142* 115  BILITOT 1.0 1.0  PROT 7.0 6.0*  ALBUMIN 3.1* 2.8*   No results for input(s): "LIPASE", "AMYLASE" in the last 72 hours. CBC: Recent Labs    02/17/23 0813 02/18/23 0306  WBC 9.5 8.1  NEUTROABS 6.8  --   HGB 12.6* 12.0*  HCT 38.6* 36.0*  MCV 91.3 90.5  PLT 247 244   Cardiac Enzymes: Recent Labs    02/17/23 0813 02/17/23 1017  TROPONINIHS 111* 116*   BNP: Recent Labs    02/17/23 0813  BNP 1,615.1*   D-Dimer: No results for input(s):  "DDIMER" in the last 72 hours. Hemoglobin A1C: No results for input(s): "HGBA1C" in the last 72 hours. Fasting Lipid Panel: No results for input(s): "CHOL", "HDL", "LDLCALC", "TRIG", "CHOLHDL", "LDLDIRECT" in the last 72 hours. Thyroid Function Tests: No results for input(s): "TSH", "T4TOTAL", "T3FREE", "THYROIDAB" in the last 72 hours.  Invalid input(s): "FREET3" Anemia Panel: No results for input(s): "VITAMINB12", "FOLATE", "FERRITIN", "TIBC", "IRON", "RETICCTPCT" in the last 72 hours.   Radiology: [facility] Chest Port 1 View  Result Date: 02/17/2023 CLINICAL DATA:  Right chest pain and shortness of breath EXAM: PORTABLE CHEST 1 VIEW COMPARISON:  Chest radiograph 08/14/2022 FINDINGS: The heart is enlarged.  The upper mediastinal contours are normal. There is vascular congestion with possible mild pulmonary interstitial edema. There is no focal airspace consolidation. There is no pleural effusion or pneumothorax There is no acute osseous abnormality. IMPRESSION: Cardiomegaly with vascular congestion and possible mild pulmonary interstitial edema. Electronically Signed   By: Lesia Hausen M.D.   On: 02/17/2023 09:33    ECHO 07/2022:  1. Left ventricular ejection fraction, by estimation, is 35-40%. The left ventricle has moderately decreased function. The LV demonstrates regional wall motion abnormalities (see scoring diagram/findings for description). Left ventricular  diastolic parameters were normal.   2. Right ventricular systolic function is normal. The right ventricular  size is normal.   3. Left atrial size was mild to moderately dilated.   4. The  mitral valve is normal in structure. Mild to moderate mitral valve  regurgitation. No evidence of mitral stenosis.   5. The aortic valve is normal in structure. Aortic valve regurgitation is  not visualized. No aortic stenosis is present.   6. The inferior vena cava is normal in size with greater than 50%  respiratory variability, suggesting right  atrial pressure of 3 mmHg.   TELEMETRY reviewed by me 02/18/2023: atrial fibrillation rate 100s  EKG reviewed by me: atrial fibrillation rate 113 bpm  Data reviewed by me 02/18/2023: last 24h vitals tele labs imaging I/O hospitalist progress note  Principal Problem:   CHF exacerbation (HCC) Active Problems:   NSTEMI (non-ST elevated myocardial infarction) (HCC)   Acute respiratory failure with hypoxia (HCC)   PAF (paroxysmal atrial fibrillation) (HCC)   Chronic kidney disease, stage 3a (HCC)   Acute on chronic systolic CHF (congestive heart failure) (HCC)   Type II diabetes mellitus with renal manifestations (HCC)    ASSESSMENT AND PLAN:  Aniruddh Hartlieb is a 88 y.o. male  with a past medical history of paroxysmal AF (Xarelto), chronic HFrEF (35-40%, rWMAs 07/2022), 1vCAD (CTO ostial LAD, 06/2019), CKD III, DM2, hx bilat BKA who presented to the ED on 02/17/2023 for chest pain and shortness of breath. Cardiology was consulted for further evaluation.   # Acute on chronic HFrEF (EF 35-40% in 07/2022) # Acute hypoxic respiratory failure Patient presenting with episode of chest pain and shortness of breath which resolved with SL nitroglycerin. Noted to be hypoxic in the 80s and placed on supplemental O2. BNP elevated to 1600 on admission. Troponins elevated but flat. He reports symptoms have now resolved.  -Continue IV lasix 40 mg -Wean O2 as tolerated -Switch metoprolol to succinate 50 mg daily. SGLT2i not ideal given chronic UTI. Consider ACE/ARB if BP and renal function allow.   # Demand ischemia # Coronary artery disease  Patient with hx of CAD medically management with troponin elevation 111 > 116 on presentation in the setting of acute heart failure and acute respiratory failure.  -Troponin elevation most consistent with demand/supply mismatch and not ACS. -Continue home asa, atorvastatin.  -Continue imdur 30 mg daily.   # Paroxysmal atrial fibrillation Patient in AF on  presentation with rates in the 110s now improved to the 90s. -Metoprolol as above for rate control.  -Continue home xarelto 15 mg daily (reduced dose appropriate given renal function).  # Chronic kidney disease stage IIIa Cr 1.9 on admission, improved to 1.8 this AM. Baseline between (1.5-2.1).  -Continue to monitor renal function with diuresis.  -Monitor and replenish electrolytes for a goal K >4, Mag >2    This patient's plan of care was discussed and created with Dr. Juliann Pares and he is in agreement.  Signed: Gale Journey, PA-C  02/18/2023, 9:07 AM [facility] Cardiology

## 2023-02-18 NOTE — Inpatient Diabetes Management (Signed)
Inpatient Diabetes Program Recommendations  AACE/ADA: New Consensus Statement on Inpatient Glycemic Control (2015)  Target Ranges:  Prepandial:   less than 140 mg/dL      Peak postprandial:   less than 180 mg/dL (1-2 hours)      Critically ill patients:  140 - 180 mg/dL   Lab Results  Component Value Date   GLUCAP 189 (H) 02/18/2023   HGBA1C 12.4 (H) 03/06/2021    Review of Glycemic Control  Diabetes history: DM 2 from SNF Outpatient Diabetes medications: Lantus 45 units qhs, Novolog 6 units breakfast, 8 units lunch, 6 units supper Current orders for Inpatient glycemic control:  Novolog 0-15 units tid + hs Novolog 4 units tid meal coverage  Inpatient Diabetes Program Recommendations:    -  start 10 units of Semglee  Thanks,  Christena Deem RN, MSN, BC-ADM Inpatient Diabetes Coordinator Team Pager [phone] (8a-5p)

## 2023-02-19 DIAGNOSIS — I509 Heart failure, unspecified: Secondary | ICD-10-CM | POA: Diagnosis not present

## 2023-02-19 LAB — BASIC METABOLIC PANEL
Anion gap: 11 (ref 5–15)
BUN: 42 mg/dL — ABNORMAL HIGH (ref 8–23)
CO2: 23 mmol/L (ref 22–32)
Calcium: 8.3 mg/dL — ABNORMAL LOW (ref 8.9–10.3)
Chloride: 103 mmol/L (ref 98–111)
Creatinine, Ser: 1.86 mg/dL — ABNORMAL HIGH (ref 0.61–1.24)
GFR, Estimated: 35 mL/min — ABNORMAL LOW (ref >60)
Glucose, Bld: 162 mg/dL — ABNORMAL HIGH (ref 70–99)
Potassium: 4 mmol/L (ref 3.5–5.1)
Sodium: 137 mmol/L (ref 135–145)

## 2023-02-19 LAB — CBC
HCT: 39.4 % (ref 39.0–52.0)
Hemoglobin: 13 g/dL (ref 13.0–17.0)
MCH: 29.5 pg (ref 26.0–34.0)
MCHC: 33 g/dL (ref 30.0–36.0)
MCV: 89.3 fL (ref 80.0–100.0)
Platelets: 254 10*3/uL (ref 150–400)
RBC: 4.41 MIL/uL (ref 4.22–5.81)
RDW: 14.3 % (ref 11.5–15.5)
WBC: 9.6 10*3/uL (ref 4.0–10.5)
nRBC: 0 % (ref 0.0–0.2)

## 2023-02-19 LAB — GLUCOSE, CAPILLARY
Glucose-Capillary: 158 mg/dL — ABNORMAL HIGH (ref 70–99)
Glucose-Capillary: 178 mg/dL — ABNORMAL HIGH (ref 70–99)
Glucose-Capillary: 139 mg/dL — ABNORMAL HIGH (ref 70–99)
Glucose-Capillary: 163 mg/dL — ABNORMAL HIGH (ref 70–99)

## 2023-02-19 LAB — BRAIN NATRIURETIC PEPTIDE: B Natriuretic Peptide: 1437.7 pg/mL — ABNORMAL HIGH (ref 0.0–100.0)

## 2023-02-19 MED ORDER — FUROSEMIDE 10 MG/ML IJ SOLN
40.0000 mg | Freq: Two times a day (BID) | INTRAMUSCULAR | Status: DC
  Filled 2023-02-19: qty 4

## 2023-02-19 MED ORDER — FUROSEMIDE 10 MG/ML IJ SOLN
40.0000 mg | Freq: Once | INTRAMUSCULAR | Status: AC
  Administered 2023-02-19: 40 mg via INTRAVENOUS
  Filled 2023-02-19: qty 4

## 2023-02-19 MED ORDER — FUROSEMIDE 10 MG/ML IJ SOLN: 20.0000 mg | Freq: Two times a day (BID) | INTRAMUSCULAR | Status: DC

## 2023-02-19 NOTE — Plan of Care (Signed)
  Problem: Education: Goal: Ability to demonstrate management of disease process will improve Outcome: Progressing Goal: Ability to verbalize understanding of medication therapies will improve Outcome: Progressing Goal: Individualized Educational Video(s) Outcome: Progressing   Problem: Activity: Goal: Capacity to carry out activities will improve Outcome: Progressing   

## 2023-02-19 NOTE — Progress Notes (Signed)
Patient ID: [name], male   DOB: 03-31-1935, 88 y.o.   MRN: [id number] [facility] Cardiology    SUBJECTIVE: Patient resting comfortably in bed presented with dyspnea shortness of breath now on oxygen patient states that he is ready to go home he feels much better denies any palpitation tachycardia and shortness of breath is much improved.  Bilateral BKA   Vitals:   02/18/23 2036 02/18/23 2301 02/19/23 0400 02/19/23 0836  BP:  118/72 122/68 137/89  Pulse:  99 99 (!) 118  Resp:  20 20   Temp:  97.9 F (36.6 C) 98.1 F (36.7 C) 98 F (36.7 C)  TempSrc:  Oral Oral   SpO2: 93% 96% 97% (!) 89%  Weight:      Height:         Intake/Output Summary (Last 24 hours) at 02/19/2023 1109 Last data filed at 02/18/2023 1915 Gross per 24 hour  Intake 540 ml  Output 1575 ml  Net -1035 ml      PHYSICAL EXAM  General: Well developed, well nourished, in no acute distress HEENT:  Normocephalic and atramatic Neck:  No JVD.  Lungs: Clear bilaterally to auscultation and percussion. Heart: HRRR . Normal S1 and S2 without gallops or murmurs.  Abdomen: Bowel sounds are positive, abdomen soft and non-tender  Msk:  Back normal, normal gait. Normal strength and tone for age. Extremities: No clubbing, cyanosis or edema.  Bilateral BKA's Neuro: Alert and oriented X 3. Psych:  Good affect, responds appropriately   LABS: Basic Metabolic Panel: Recent Labs    02/18/23 0306 02/19/23 0745  NA 139 137  K 3.9 4.0  CL 104 103  CO2 26 23  GLUCOSE 182* 162*  BUN 39* 42*  CREATININE 1.80* 1.86*  CALCIUM 8.3* 8.3*  MG 2.0  --   PHOS 3.8  --    Liver Function Tests: Recent Labs    02/17/23 0813 02/18/23 0306  AST 29 19  ALT 41 33  ALKPHOS 142* 115  BILITOT 1.0 1.0  PROT 7.0 6.0*  ALBUMIN 3.1* 2.8*   No results for input(s): "LIPASE", "AMYLASE" in the last 72 hours. CBC: Recent Labs    02/17/23 0813 02/18/23 0306 02/19/23 0745  WBC 9.5 8.1 9.6  NEUTROABS 6.8  --   --   HGB  12.6* 12.0* 13.0  HCT 38.6* 36.0* 39.4  MCV 91.3 90.5 89.3  PLT 247 244 254   Cardiac Enzymes: No results for input(s): "CKTOTAL", "CKMB", "CKMBINDEX", "TROPONINI" in the last 72 hours. BNP: Invalid input(s): "POCBNP" D-Dimer: No results for input(s): "DDIMER" in the last 72 hours. Hemoglobin A1C: Recent Labs    02/18/23 0306  HGBA1C 8.6*   Fasting Lipid Panel: No results for input(s): "CHOL", "HDL", "LDLCALC", "TRIG", "CHOLHDL", "LDLDIRECT" in the last 72 hours. Thyroid Function Tests: No results for input(s): "TSH", "T4TOTAL", "T3FREE", "THYROIDAB" in the last 72 hours.  Invalid input(s): "FREET3" Anemia Panel: No results for input(s): "VITAMINB12", "FOLATE", "FERRITIN", "TIBC", "IRON", "RETICCTPCT" in the last 72 hours.  No results found.   Echo mild to moderate reduced left ventricular function EF around 35 to 40%  TELEMETRY: Atrial fibrillation rate of 108:  ASSESSMENT AND PLAN:  Principal Problem:   CHF exacerbation (HCC) Active Problems:   NSTEMI (non-ST elevated myocardial infarction) (HCC)   Acute respiratory failure with hypoxia (HCC)   PAF (paroxysmal atrial fibrillation) (HCC)   Chronic kidney disease, stage 3a (HCC)   Acute on chronic systolic CHF (congestive heart failure) (HCC)  Type II diabetes mellitus with renal manifestations (HCC)    Plan Acute on chronic congestive heart failure EF 35 to 40% continue current medical therapy Proximal aggressive failure supplemental oxygen as necessary inhalers consider pulmonary Elevated troponin demand ischemia Coronary disease appears to be demand ischemia management do not recommend cardiac cath Recommend aspirin and statin therapy Chronic renal insufficiency stage IIIa stable consider follow-up with nephrology Atrial fibrillation rate of around the 100 continue metoprolol and Xarelto Bilateral BKA chronic stable probably related to peripheral vascular disease related to diabetes Diabetes type 2  uncomplicated continue current therapy   Alwyn Pea, MD 02/19/2023 11:09 AM

## 2023-02-19 NOTE — Progress Notes (Signed)
[facility] Progress Note  Patient: [name]    ZOX:[id number]  DOA: 02/17/2023     Date of Service: the patient was seen and examined on 02/19/2023  Chief Complaint  Patient presents with   Chest Pain    Resident of [facility], was sent here this morning for chest pain and shortness of breath that began when he woke up; Was given NTG SL x 1 with relief; Upon arrival, patient denies chest pain and states that his shortness of breath has improved; Does not wear O2 at home, 87% on RA upon arrival   Brief hospital course: [name] is a 88 y.o. male with medical history significant of HFrEF, atrial fibrillation, stage III CKD, hypertension, hyperlipidemia, type 2 diabetes presenting with acute respiratory failure with hypoxia, acute on chronic HFrEF.  Limited history in the setting of age and dementia.  History primarily from patient's son [name] ([id number]) who is also HCPOA.  Per report, patient with increased work of breathing cough over 1 to 2 days.  No reported fevers or chills.  No reported nausea or vomiting.  Per report, has been compliant with diuretic regimen at facility.  No reported chest pain.  No focal hemiparesis reported. Presented to the ER afebrile, hemodynamically stable.  Satting in the mid 80s on room air, transition to 2 L nasal cannula to keep O2 sats greater than 94%.  White count 9.5, hemoglobin 12.6, platelets 247, troponin 1 11-1 16, COVID flu and RSV negative, creatinine 1.9.  BNP 1600.  Chest x-ray with cardiomegaly and interstitial pulmonary edema.   Assessment and Plan: Acute on chronic systolic CHF (congestive heart failure) (HCC) 2D echo March 2024 with EF of 35-40% as well as moderate mitral regurgitation Positive acute volume overload with noted BNP of 1600 as well as pulmonary edema chest x-ray S/p Lasix IV 40 mg x doses, started Lasix 40 mg IV bid on 9/28 Continue to follow volume status with diuresis Continue GDMT Cardiology  formally consulted Follow recommendations BNP [phone] improved  Acute respiratory failure with hypoxia (HCC) Decompensated respiratory status now requiring 2 L nasal cannula in the setting of acute on chronic HFrEF BNP 1600 with noted pulmonary edema on chest x-ray No overt infection noted Continue IV Lasix as above Strict ins and outs and daily weights Continue supplemental oxygen Monitor respiratory status and volume status closely 9/27, started nebulizer treatment twice daily   Chronic kidney disease, stage 3a (HCC) Baseline creatinine 1.5-2.1 with GFR in the 30s to 40s Creatinine 1.86 today with GFR in the 30s Monitor renal function with diuresis    Type II diabetes mellitus with renal manifestations (HCC) SSI A1c   PAF (paroxysmal atrial fibrillation) (HCC) Baseline atrial fibrillation EKG atrial flutter with rate in the 100s Titrate rate controlling regimen Continue Xarelto Follow-up cardiology recommendations   NSTEMI (non-ST elevated myocardial infarction) (HCC) Troponin 100s without active chest pain in setting of acute on chronic HFrEF EKG stable, Likely secondary demand ischemia Continue home regimen including baby aspirin Follow-up cardiology recommendations  Dementia and depression, continued Namenda, Prozac and Depakote home dose Continue supportive care  Body mass index is 53.64 kg/m.  Interventions:  Diet: Heart healthy carb modified diet DVT Prophylaxis: Therapeutic Anticoagulation with Xarelto    Advance goals of care discussion: DNR/DNI  Family Communication: family was not present at bedside, at the time of interview.  The pt provided permission to discuss medical plan with the family. Opportunity was given to ask question and all questions  were answered satisfactorily.   Disposition:  Pt is Resident of [facility] , admitted with CHF, still has SOB, which precludes a safe discharge. Discharge back to [facility], when stable and  cleared by cardiology.  Subjective: No significant events overnight, patient still has shortness of breath but feels improvement, any chest pain or palpitations.  Physical Exam: General: NAD, lying comfortably Appear in no distress, affect appropriate Eyes: PERRLA ENT: Oral Mucosa Clear, moist  Neck: no JVD,  Cardiovascular: S1 and S2 Present, no Murmur,  Respiratory: good respiratory effort, Bilateral Air entry equal and Decreased, mild crackles, and mild wheezing Abdomen: Bowel Sound present, Soft and no tenderness,  Skin: no rashes Extremities: b/p BKA, no edema  Neurologic: without any new focal findings Gait not checked due to patient safety concerns  Vitals:   02/18/23 2301 02/19/23 0400 02/19/23 0836 02/19/23 1135  BP: 118/72 122/68 137/89 (!) 123/93  Pulse: 99 99 (!) 118 (!) 110  Resp: 20 20    Temp: 97.9 F (36.6 C) 98.1 F (36.7 C) 98 F (36.7 C) 97.9 F (36.6 C)  TempSrc: Oral Oral    SpO2: 96% 97% (!) 89% 94%  Weight:      Height:        Intake/Output Summary (Last 24 hours) at 02/19/2023 1243 Last data filed at 02/19/2023 1100 Gross per 24 hour  Intake 540 ml  Output 1875 ml  Net -1335 ml   Filed Weights   02/17/23 0811  Weight: 97.2 kg    Data Reviewed: I have personally reviewed and interpreted daily labs, tele strips, imagings as discussed above. I reviewed all nursing notes, pharmacy notes, vitals, pertinent old records I have discussed plan of care as described above with RN and patient/family.  CBC: Recent Labs  Lab 02/17/23 0813 02/18/23 0306 02/19/23 0745  WBC 9.5 8.1 9.6  NEUTROABS 6.8  --   --   HGB 12.6* 12.0* 13.0  HCT 38.6* 36.0* 39.4  MCV 91.3 90.5 89.3  PLT 247 244 254   Basic Metabolic Panel: Recent Labs  Lab 02/17/23 0813 02/18/23 0306 02/19/23 0745  NA 137 139 137  K 4.3 3.9 4.0  CL 102 104 103  CO2 23 26 23   GLUCOSE 298* 182* 162*  BUN 43* 39* 42*  CREATININE 1.90* 1.80* 1.86*  CALCIUM 8.4* 8.3* 8.3*  MG  --   2.0  --   PHOS  --  3.8  --     Studies: No results found.  Scheduled Meds:  arformoterol  15 mcg Nebulization BID   aspirin EC  81 mg Oral Daily   atorvastatin  80 mg Oral QHS   budesonide (PULMICORT) nebulizer solution  0.25 mg Nebulization BID   divalproex  500 mg Oral Daily   FLUoxetine  60 mg Oral Daily   insulin aspart  0-15 Units Subcutaneous TID WC   insulin aspart  0-5 Units Subcutaneous QHS   insulin aspart  4 Units Subcutaneous TID WC   isosorbide mononitrate  60 mg Oral Daily   memantine  10 mg Oral BID   metoprolol succinate  50 mg Oral Daily   mirabegron ER  75 mg Oral Daily   Rivaroxaban  15 mg Oral Q supper   sodium chloride flush  3 mL Intravenous Q12H   trimethoprim  100 mg Oral Daily   Continuous Infusions:  sodium chloride     PRN Meds: sodium chloride, ondansetron **OR** ondansetron (ZOFRAN) IV, sodium chloride flush  Time spent: 35  minutes  Author: Gillis Santa. MD [facility] 02/19/2023 12:43 PM  To reach On-call, see care teams to locate the attending and reach out to them via [email or web address]. If 7PM-7AM, please contact night-coverage If you still have difficulty reaching the attending provider, please page the [facility] (Director on Call) for [facility] on amion for assistance.

## 2023-02-20 DIAGNOSIS — I509 Heart failure, unspecified: Secondary | ICD-10-CM | POA: Diagnosis not present

## 2023-02-20 DIAGNOSIS — I4892 Unspecified atrial flutter: Secondary | ICD-10-CM

## 2023-02-20 LAB — BASIC METABOLIC PANEL
Anion gap: 13 (ref 5–15)
Anion gap: 10 (ref 5–15)
BUN: 43 mg/dL — ABNORMAL HIGH (ref 8–23)
BUN: 50 mg/dL — ABNORMAL HIGH (ref 8–23)
CO2: 25 mmol/L (ref 22–32)
CO2: 26 mmol/L (ref 22–32)
Calcium: 8.6 mg/dL — ABNORMAL LOW (ref 8.9–10.3)
Calcium: 8.5 mg/dL — ABNORMAL LOW (ref 8.9–10.3)
Chloride: 102 mmol/L (ref 98–111)
Chloride: 103 mmol/L (ref 98–111)
Creatinine, Ser: 2.01 mg/dL — ABNORMAL HIGH (ref 0.61–1.24)
Creatinine, Ser: 2.02 mg/dL — ABNORMAL HIGH (ref 0.61–1.24)
GFR, Estimated: 32 mL/min — ABNORMAL LOW (ref >60)
GFR, Estimated: 32 mL/min — ABNORMAL LOW (ref >60)
Glucose, Bld: 165 mg/dL — ABNORMAL HIGH (ref 70–99)
Glucose, Bld: 221 mg/dL — ABNORMAL HIGH (ref 70–99)
Potassium: 3.7 mmol/L (ref 3.5–5.1)
Potassium: 4.5 mmol/L (ref 3.5–5.1)
Sodium: 140 mmol/L (ref 135–145)
Sodium: 139 mmol/L (ref 135–145)

## 2023-02-20 LAB — GLUCOSE, CAPILLARY
Glucose-Capillary: 157 mg/dL — ABNORMAL HIGH (ref 70–99)
Glucose-Capillary: 171 mg/dL — ABNORMAL HIGH (ref 70–99)
Glucose-Capillary: 121 mg/dL — ABNORMAL HIGH (ref 70–99)
Glucose-Capillary: 151 mg/dL — ABNORMAL HIGH (ref 70–99)

## 2023-02-20 LAB — BRAIN NATRIURETIC PEPTIDE: B Natriuretic Peptide: 1607.8 pg/mL — ABNORMAL HIGH (ref 0.0–100.0)

## 2023-02-20 LAB — CBC
HCT: 40.9 % (ref 39.0–52.0)
Hemoglobin: 13.7 g/dL (ref 13.0–17.0)
MCH: 30 pg (ref 26.0–34.0)
MCHC: 33.5 g/dL (ref 30.0–36.0)
MCV: 89.7 fL (ref 80.0–100.0)
Platelets: 278 10*3/uL (ref 150–400)
RBC: 4.56 MIL/uL (ref 4.22–5.81)
RDW: 14.6 % (ref 11.5–15.5)
WBC: 10.6 10*3/uL — ABNORMAL HIGH (ref 4.0–10.5)
nRBC: 0 % (ref 0.0–0.2)

## 2023-02-20 LAB — MAGNESIUM: Magnesium: 2.1 mg/dL (ref 1.7–2.4)

## 2023-02-20 LAB — PHOSPHORUS: Phosphorus: 4.5 mg/dL (ref 2.5–4.6)

## 2023-02-20 LAB — VITAMIN D 25 HYDROXY (VIT D DEFICIENCY, FRACTURES): Vit D, 25-Hydroxy: 43.78 ng/mL (ref 30–100)

## 2023-02-20 MED ORDER — METOPROLOL TARTRATE 5 MG/5ML IV SOLN
5.0000 mg | Freq: Once | INTRAVENOUS | Status: AC
  Administered 2023-02-20: 5 mg via INTRAVENOUS
  Filled 2023-02-20: qty 5

## 2023-02-20 MED ORDER — MELATONIN 5 MG PO TABS
5.0000 mg | ORAL_TABLET | Freq: Every day | ORAL | Status: DC
  Administered 2023-02-20 – 2023-02-24 (×5): 5 mg via ORAL
  Filled 2023-02-20 (×5): qty 1

## 2023-02-20 MED ORDER — SOTALOL HCL 80 MG PO TABS
80.0000 mg | ORAL_TABLET | ORAL | Status: DC
  Administered 2023-02-20 – 2023-02-21 (×2): 80 mg via ORAL
  Filled 2023-02-20 (×3): qty 1

## 2023-02-20 MED ORDER — SOTALOL HCL 80 MG PO TABS: 80.0000 mg | ORAL_TABLET | Freq: Two times a day (BID) | ORAL | Status: DC

## 2023-02-20 MED ORDER — RIVAROXABAN 15 MG PO TABS
15.0000 mg | ORAL_TABLET | Freq: Every day | ORAL | Status: DC
  Administered 2023-02-20 – 2023-02-24 (×5): 15 mg via ORAL
  Filled 2023-02-20 (×6): qty 1

## 2023-02-20 MED ORDER — SODIUM CHLORIDE 0.9 % IV SOLN: 250.0000 mL | INTRAVENOUS | Status: DC | PRN

## 2023-02-20 MED ORDER — FLUTICASONE FUROATE-VILANTEROL 200-25 MCG/ACT IN AEPB
1.0000 | INHALATION_SPRAY | Freq: Every day | RESPIRATORY_TRACT | Status: DC
  Administered 2023-02-20 – 2023-02-25 (×6): 1 via RESPIRATORY_TRACT
  Filled 2023-02-20: qty 28

## 2023-02-20 MED ORDER — SOTALOL HCL 80 MG PO TABS
80.0000 mg | ORAL_TABLET | Freq: Two times a day (BID) | ORAL | Status: DC
  Filled 2023-02-20: qty 1

## 2023-02-20 MED ORDER — SODIUM CHLORIDE 0.9% FLUSH
3.0000 mL | Freq: Two times a day (BID) | INTRAVENOUS | Status: DC
  Administered 2023-02-20 – 2023-02-21 (×3): 3 mL via INTRAVENOUS

## 2023-02-20 MED ORDER — IPRATROPIUM-ALBUTEROL 0.5-2.5 (3) MG/3ML IN SOLN: 3.0000 mL | Freq: Four times a day (QID) | RESPIRATORY_TRACT | Status: DC | PRN

## 2023-02-20 MED ORDER — TRIMETHOPRIM 100 MG PO TABS: 50.0000 mg | ORAL_TABLET | Freq: Every day | ORAL | Status: DC

## 2023-02-20 MED ORDER — METOPROLOL TARTRATE 50 MG PO TABS
50.0000 mg | ORAL_TABLET | Freq: Two times a day (BID) | ORAL | Status: DC
  Administered 2023-02-20 (×2): 50 mg via ORAL
  Filled 2023-02-20 (×3): qty 1

## 2023-02-20 MED ORDER — POTASSIUM CHLORIDE CRYS ER 20 MEQ PO TBCR
60.0000 meq | EXTENDED_RELEASE_TABLET | Freq: Once | ORAL | Status: AC
  Administered 2023-02-20: 60 meq via ORAL
  Filled 2023-02-20: qty 3

## 2023-02-20 MED ORDER — SODIUM CHLORIDE 0.9% FLUSH: 3.0000 mL | INTRAVENOUS | Status: DC | PRN

## 2023-02-20 NOTE — Progress Notes (Signed)
Patient ID: [name], male   DOB: [birth year], 88 y.o.   MRN: [id number] [facility]    SUBJECTIVE: Patient comfortably).  About once improved no chest pain still wearing oxygen feels like he is well enough to go home   Vitals:   02/20/23 0316 02/20/23 0748 02/20/23 0900 02/20/23 1159  BP: (!) 135/96 (!) 148/110  113/66  Pulse: 93 (!) 113 (!) 114 (!) 110  Resp: 20  18 17   Temp: 98.5 F (36.9 C) 97.9 F (36.6 C)  98 F (36.7 C)  TempSrc: Oral   Oral  SpO2: 92% 94% 95% 95%  Weight:      Height:         Intake/Output Summary (Last 24 hours) at 02/20/2023 1315 Last data filed at 02/20/2023 0318 Gross per 24 hour  Intake 240 ml  Output 300 ml  Net -60 ml      PHYSICAL EXAM  General: Well developed, well nourished, in no acute distress HEENT:  Normocephalic and atramatic Neck:  No JVD.  Lungs: Clear bilaterally to auscultation and percussion. Heart: Irregular irregular tachycardic. Normal S1 and S2 without gallops or murmurs.  Abdomen: Bowel sounds are positive, abdomen soft and non-tender  Msk:  Back normal, normal gait. Normal strength and tone for age. Extremities: No clubbing, cyanosis or edema.   Neuro: Alert and oriented X 3. Psych:  Good affect, responds appropriately   LABS: Basic Metabolic Panel: Recent Labs    02/18/23 0306 02/19/23 0745 02/20/23 0407  NA 139 137 140  K 3.9 4.0 3.7  CL 104 103 102  CO2 26 23 25   GLUCOSE 182* 162* 165*  BUN 39* 42* 43*  CREATININE 1.80* 1.86* 2.01*  CALCIUM 8.3* 8.3* 8.6*  MG 2.0  --  2.1  PHOS 3.8  --  4.5   Liver Function Tests: Recent Labs    02/18/23 0306  AST 19  ALT 33  ALKPHOS 115  BILITOT 1.0  PROT 6.0*  ALBUMIN 2.8*   No results for input(s): "LIPASE", "AMYLASE" in the last 72 hours. CBC: Recent Labs    02/19/23 0745 02/20/23 0407  WBC 9.6 10.6*  HGB 13.0 13.7  HCT 39.4 40.9  MCV 89.3 89.7  PLT 254 278   Cardiac Enzymes: No results for input(s): "CKTOTAL", "CKMB",  "CKMBINDEX", "TROPONINI" in the last 72 hours. BNP: Invalid input(s): "POCBNP" D-Dimer: No results for input(s): "DDIMER" in the last 72 hours. Hemoglobin A1C: Recent Labs    02/18/23 0306  HGBA1C 8.6*   Fasting Lipid Panel: No results for input(s): "CHOL", "HDL", "LDLCALC", "TRIG", "CHOLHDL", "LDLDIRECT" in the last 72 hours. Thyroid Function Tests: No results for input(s): "TSH", "T4TOTAL", "T3FREE", "THYROIDAB" in the last 72 hours.  Invalid input(s): "FREET3" Anemia Panel: No results for input(s): "VITAMINB12", "FOLATE", "FERRITIN", "TIBC", "IRON", "RETICCTPCT" in the last 72 hours.  No results found.   Echo mild to moderate depressed left ventricular function EF around 35 to 40%  TELEMETRY: Atrial fibrillation rapid ventricular sponsor rate around 115:  ASSESSMENT AND PLAN:  Principal Problem:   CHF exacerbation (HCC) Active Problems:   NSTEMI (non-ST elevated myocardial infarction) (HCC)   Acute respiratory failure with hypoxia (HCC)   PAF (paroxysmal atrial fibrillation) (HCC)   Chronic kidney disease, stage 3a (HCC)   Acute on chronic systolic CHF (congestive heart failure) (HCC)   Type II diabetes mellitus with renal manifestations (HCC)    Plan Acute on chronic congestive heart failure EF around 35 to 40% continue current therapy  with diuretics transition to p.o. recommend GDMT Respiratory failure with hypoxemia continue supplemental oxygen as necessary elevated BNP abnormal chest x-ray continue diuretic therapy nebulizers Chronic renal insufficiency stage IIIa consider adequate hydration follow-up with nephrology Atrial fibrillation difficult rate control will increase rate control therapy with Cardizem or metoprolol continue Xarelto for anticoagulation consider adding amiodarone Elevated troponin probably demand ischemia do not recommend any invasive procedures continue supplemental oxygen aspirin Peripheral vascular disease bilateral BKA's continue  conservative management Diabetes type 2 continue conservative therapy follow diabetic diet   Alwyn Pea, MD 02/20/2023 1:15 PM

## 2023-02-20 NOTE — Progress Notes (Signed)
[facility] Progress Note  Patient: [name]    ZOX:[id number]  DOA: 02/17/2023     Date of Service: the patient was seen and examined on 02/20/2023  Chief Complaint  Patient presents with   Chest Pain    Resident of [facility], was sent here this morning for chest pain and shortness of breath that began when he woke up; Was given NTG SL x 1 with relief; Upon arrival, patient denies chest pain and states that his shortness of breath has improved; Does not wear O2 at home, 87% on RA upon arrival   Brief hospital course: Samier Jaco is a 88 y.o. male with medical history significant of HFrEF, atrial fibrillation, stage III CKD, hypertension, hyperlipidemia, type 2 diabetes presenting with acute respiratory failure with hypoxia, acute on chronic HFrEF.  Limited history in the setting of age and dementia.  History primarily from patient's son [name] ([id number]) who is also HCPOA.  Per report, patient with increased work of breathing cough over 1 to 2 days.  No reported fevers or chills.  No reported nausea or vomiting.  Per report, has been compliant with diuretic regimen at facility.  No reported chest pain.  No focal hemiparesis reported. Presented to the ER afebrile, hemodynamically stable.  Satting in the mid 80s on room air, transition to 2 L nasal cannula to keep O2 sats greater than 94%.  White count 9.5, hemoglobin 12.6, platelets 247, troponin 1 11-1 16, COVID flu and RSV negative, creatinine 1.9.  BNP 1600.  Chest x-ray with cardiomegaly and interstitial pulmonary edema.   Assessment and Plan: Acute on chronic systolic CHF (congestive heart failure)  2D echo March 2024 with EF of 35-40% as well as moderate mitral regurgitation Positive acute volume overload with noted BNP of 1600 as well as pulmonary edema chest x-ray S/p Lasix IV 40 mg x doses, s/p Lasix 40 mg IV bid on 9/28 Continue to follow volume status with diuresis Continue GDMT Cardiology formally  consulted Follow recommendations BNP [phone] improved  Acute respiratory failure with hypoxia  Decompensated respiratory status now requiring 2 L nasal cannula in the setting of acute on chronic HFrEF, BNP 1600 with noted pulmonary edema on chest x-ray No overt infection noted. S/p IV Lasix as above On 9/27, started Brovana and budesonide nebulizer twice daily.  Respiratory failure resolved, currently patient is saturating well on room air. Discontinued Brovana and budesonide, transition to [employer] inhaler on 9/29, continue DuoNeb as needed    Chronic kidney disease, stage 3a (HCC) Baseline creatinine 1.5-2.1 with GFR in the 30s to 40s Creatinine 1.86 today with GFR in the 30s Monitor renal function with diuresis 9/29, creatinine 2.01 slightly elevated today, discontinued Lasix   Type II diabetes mellitus with renal manifestations (HCC) SSI A1c   PAF (paroxysmal atrial fibrillation) (HCC) Baseline atrial fibrillation EKG atrial flutter with rate in the 100s Titrate rate controlling regimen Continue Xarelto Follow-up cardiology recommendations   NSTEMI (non-ST elevated myocardial infarction) (HCC) Troponin 100s without active chest pain in setting of acute on chronic HFrEF EKG stable, Likely secondary demand ischemia Continue home regimen including baby aspirin Follow-up cardiology recommendations  Dementia and depression, continued Namenda, Prozac and Depakote home dose Continue supportive care  Body mass index is 53.64 kg/m.  Interventions:  Diet: Heart healthy carb modified diet DVT Prophylaxis: Therapeutic Anticoagulation with Xarelto    Advance goals of care discussion: DNR/DNI  Family Communication: family was not present at bedside, at the time of interview.  The pt provided permission to discuss medical plan with the family. Opportunity was given to ask question and all questions were answered satisfactorily.   Disposition:  Pt is Resident of  [facility] , admitted with CHF, still has SOB, which precludes a safe discharge. Discharge back to [facility], when stable and cleared by cardiology.  Subjective: No significant events overnight, patient feels improvement in the shortness of breath, no more needs of supplemental O2 admission, respiratory failure resolved.  Patient denied any chest pain or palpitation, no nasal complaints.  Physical Exam: General: NAD, lying comfortably Appear in no distress, affect appropriate Eyes: PERRLA ENT: Oral Mucosa Clear, moist  Neck: no JVD,  Cardiovascular: S1 and S2 Present, no Murmur,  Respiratory: good respiratory effort, Bilateral Air entry equal and Decreased, mild crackles, and mild wheezing Abdomen: Bowel Sound present, Soft and no tenderness,  Skin: no rashes Extremities: b/p BKA, no edema  Neurologic: without any new focal findings Gait not checked due to patient safety concerns  Vitals:   02/20/23 0748 02/20/23 0900 02/20/23 1159 02/20/23 1401  BP: (!) 148/110  113/66 121/83  Pulse: (!) 113 (!) 114 (!) 110   Resp:  18 17   Temp: 97.9 F (36.6 C)  98 F (36.7 C)   TempSrc:   Oral   SpO2: 94% 95% 95%   Weight:      Height:        Intake/Output Summary (Last 24 hours) at 02/20/2023 1410 Last data filed at 02/20/2023 1610 Gross per 24 hour  Intake 240 ml  Output 300 ml  Net -60 ml   Filed Weights   02/17/23 0811  Weight: 97.2 kg    Data Reviewed: I have personally reviewed and interpreted daily labs, tele strips, imagings as discussed above. I reviewed all nursing notes, pharmacy notes, vitals, pertinent old records I have discussed plan of care as described above with RN and patient/family.  CBC: Recent Labs  Lab 02/17/23 0813 02/18/23 0306 02/19/23 0745 02/20/23 0407  WBC 9.5 8.1 9.6 10.6*  NEUTROABS 6.8  --   --   --   HGB 12.6* 12.0* 13.0 13.7  HCT 38.6* 36.0* 39.4 40.9  MCV 91.3 90.5 89.3 89.7  PLT 247 244 254 278   Basic Metabolic  Panel: Recent Labs  Lab 02/17/23 0813 02/18/23 0306 02/19/23 0745 02/20/23 0407  NA 137 139 137 140  K 4.3 3.9 4.0 3.7  CL 102 104 103 102  CO2 23 26 23 25   GLUCOSE 298* 182* 162* 165*  BUN 43* 39* 42* 43*  CREATININE 1.90* 1.80* 1.86* 2.01*  CALCIUM 8.4* 8.3* 8.3* 8.6*  MG  --  2.0  --  2.1  PHOS  --  3.8  --  4.5    Studies: No results found.  Scheduled Meds:  aspirin EC  81 mg Oral Daily   atorvastatin  80 mg Oral QHS   divalproex  500 mg Oral Daily   FLUoxetine  60 mg Oral Daily   fluticasone furoate-vilanterol  1 puff Inhalation Daily   insulin aspart  0-15 Units Subcutaneous TID WC   insulin aspart  0-5 Units Subcutaneous QHS   insulin aspart  4 Units Subcutaneous TID WC   isosorbide mononitrate  60 mg Oral Daily   memantine  10 mg Oral BID   metoprolol succinate  50 mg Oral Daily   mirabegron ER  75 mg Oral Daily   Rivaroxaban  15 mg Oral Q supper   sodium chloride  flush  3 mL Intravenous Q12H   Continuous Infusions:  sodium chloride     PRN Meds: sodium chloride, ipratropium-albuterol, ondansetron **OR** ondansetron (ZOFRAN) IV, sodium chloride flush  Time spent: 35 minutes  Author: Gillis Santa. MD [facility] 02/20/2023 2:10 PM  To reach On-call, see care teams to locate the attending and reach out to them via [email or web address]. If 7PM-7AM, please contact night-coverage If you still have difficulty reaching the attending provider, please page the [facility] (Director on Call) for [facility] on amion for assistance.

## 2023-02-20 NOTE — Consult Note (Signed)
PHARMACY NOTE:  ANTIMICROBIAL RENAL DOSAGE ADJUSTMENT  Current antimicrobial regimen includes a mismatch between antimicrobial dosage and estimated renal function.  As per policy approved by the Pharmacy & Therapeutics and Medical Executive Committees, the antimicrobial dosage will be adjusted accordingly.  Current antimicrobial dosage:  trimethoprim 100 mg daily  Indication: UTI prophylaxis  Renal Function:  Estimated Creatinine Clearance: 22.5 mL/min (A) (by C-G formula based on SCr of 2.01 mg/dL (H)). []      On intermittent HD, scheduled: []      On CRRT    Antimicrobial dosage has been changed to:  trimethoprim 50 mg daily  Additional comments:   Thank you for allowing pharmacy to be a part of this patient's care.  Ronnald Ramp, [facility] 02/20/2023 8:44 AM

## 2023-02-20 NOTE — Progress Notes (Signed)
Pharmacy Consult for Sotalol Electrolyte Replacement  Pharmacy consulted to assist in monitoring and replacing electrolytes in this 88 y.o. male admitted on 02/17/2023 undergoing sotalol initiation . First sotalol dose: 80 mg   Labs:    Component Value Date/Time   K 3.7 02/20/2023 0407   K 3.5 01/20/2013 0255   MG 2.1 02/20/2023 0407     Plan: Potassium: K 3.5-3.7:  Hold Sotalol initiation and give KCl 60 mEq po x1 and repeat BMET 2hr after dose - repeat appropriate dose if K < 4    Magnesium: Mg >2: Appropriate to initiate Sotalol, no replacement needed     Thank you for allowing pharmacy to participate in this patient's care   Merryl Hacker, PharmD Clinical Pharmacist 02/20/2023  3:47 PM

## 2023-02-20 NOTE — Progress Notes (Signed)
Pharmacy Consult for Sotalol Electrolyte Replacement  Pharmacy consulted to assist in monitoring and replacing electrolytes in this 88 y.o. male admitted on 02/17/2023 undergoing sotalol initiation . First sotalol dose: 80 mg   Labs:    Component Value Date/Time   K 4.5 02/20/2023 1921   K 3.5 01/20/2013 0255   MG 2.1 02/20/2023 0407     Plan: Potassium: K >/= 4: Appropriate to initiate Sotalol, no replacement needed    Magnesium: Mg >2: Appropriate to initiate Sotalol, no replacement needed     Thank you for allowing pharmacy to participate in this patient's care   Merryl Hacker, PharmD Clinical Pharmacist 02/20/2023  8:05 PM

## 2023-02-21 DIAGNOSIS — I509 Heart failure, unspecified: Secondary | ICD-10-CM | POA: Diagnosis not present

## 2023-02-21 DIAGNOSIS — I4891 Unspecified atrial fibrillation: Secondary | ICD-10-CM

## 2023-02-21 DIAGNOSIS — R001 Bradycardia, unspecified: Secondary | ICD-10-CM

## 2023-02-21 DIAGNOSIS — I2699 Other pulmonary embolism without acute cor pulmonale: Secondary | ICD-10-CM

## 2023-02-21 LAB — CBC
HCT: 39.9 % (ref 39.0–52.0)
Hemoglobin: 13.7 g/dL (ref 13.0–17.0)
MCH: 30.3 pg (ref 26.0–34.0)
MCHC: 34.3 g/dL (ref 30.0–36.0)
MCV: 88.3 fL (ref 80.0–100.0)
Platelets: 253 10*3/uL (ref 150–400)
RBC: 4.52 MIL/uL (ref 4.22–5.81)
RDW: 14.8 % (ref 11.5–15.5)
WBC: 10.3 10*3/uL (ref 4.0–10.5)
nRBC: 0 % (ref 0.0–0.2)

## 2023-02-21 LAB — BASIC METABOLIC PANEL
Anion gap: 14 (ref 5–15)
BUN: 54 mg/dL — ABNORMAL HIGH (ref 8–23)
CO2: 22 mmol/L (ref 22–32)
Calcium: 8.7 mg/dL — ABNORMAL LOW (ref 8.9–10.3)
Chloride: 105 mmol/L (ref 98–111)
Creatinine, Ser: 2.06 mg/dL — ABNORMAL HIGH (ref 0.61–1.24)
GFR, Estimated: 31 mL/min — ABNORMAL LOW (ref >60)
Glucose, Bld: 189 mg/dL — ABNORMAL HIGH (ref 70–99)
Potassium: 5 mmol/L (ref 3.5–5.1)
Sodium: 141 mmol/L (ref 135–145)

## 2023-02-21 LAB — BRAIN NATRIURETIC PEPTIDE: B Natriuretic Peptide: 1656.9 pg/mL — ABNORMAL HIGH (ref 0.0–100.0)

## 2023-02-21 LAB — GLUCOSE, CAPILLARY
Glucose-Capillary: 150 mg/dL — ABNORMAL HIGH (ref 70–99)
Glucose-Capillary: 225 mg/dL — ABNORMAL HIGH (ref 70–99)
Glucose-Capillary: 60 mg/dL — ABNORMAL LOW (ref 70–99)
Glucose-Capillary: 80 mg/dL (ref 70–99)
Glucose-Capillary: 126 mg/dL — ABNORMAL HIGH (ref 70–99)

## 2023-02-21 LAB — MAGNESIUM: Magnesium: 2.2 mg/dL (ref 1.7–2.4)

## 2023-02-21 MED ORDER — BUMETANIDE 1 MG PO TABS
2.0000 mg | ORAL_TABLET | Freq: Every day | ORAL | Status: DC
  Administered 2023-02-22 – 2023-02-25 (×4): 2 mg via ORAL
  Filled 2023-02-21 (×4): qty 2

## 2023-02-21 MED ORDER — METOPROLOL SUCCINATE ER 25 MG PO TB24
25.0000 mg | ORAL_TABLET | Freq: Two times a day (BID) | ORAL | Status: DC
  Administered 2023-02-22: 25 mg via ORAL
  Filled 2023-02-21 (×2): qty 1

## 2023-02-21 MED ORDER — HYDRALAZINE HCL 50 MG PO TABS: 50.0000 mg | ORAL_TABLET | Freq: Four times a day (QID) | ORAL | Status: DC | PRN

## 2023-02-21 MED ORDER — HYDRALAZINE HCL 20 MG/ML IJ SOLN: 10.0000 mg | Freq: Four times a day (QID) | INTRAMUSCULAR | Status: DC | PRN

## 2023-02-21 MED ORDER — BUMETANIDE 1 MG PO TABS
2.0000 mg | ORAL_TABLET | Freq: Every day | ORAL | Status: DC
  Filled 2023-02-21: qty 2

## 2023-02-21 MED ORDER — METOPROLOL SUCCINATE ER 50 MG PO TB24: 50.0000 mg | ORAL_TABLET | Freq: Two times a day (BID) | ORAL | Status: DC

## 2023-02-21 NOTE — Care Management Important Message (Signed)
Important Message  Patient Details  Name: [name] MRN: [id number] Date of Birth: 1935/04/22   Important Message Given:  Yes - Medicare IM     Johnell Comings 02/21/2023, 12:00 PM

## 2023-02-21 NOTE — Progress Notes (Signed)
[facility] Progress Note  Patient: [name]    YQM:[id number]  DOA: 02/17/2023     Date of Service: the patient was seen and examined on 02/21/2023  Chief Complaint  Patient presents with   Chest Pain    Resident of [facility], was sent here this morning for chest pain and shortness of breath that began when he woke up; Was given NTG SL x 1 with relief; Upon arrival, patient denies chest pain and states that his shortness of breath has improved; Does not wear O2 at home, 87% on RA upon arrival   Brief hospital course: Kienan Doublin is a 88 y.o. male with medical history significant of HFrEF, atrial fibrillation, stage III CKD, hypertension, hyperlipidemia, type 2 diabetes presenting with acute respiratory failure with hypoxia, acute on chronic HFrEF.  Limited history in the setting of age and dementia.  History primarily from patient's son [name] ([id number]) who is also HCPOA.  Per report, patient with increased work of breathing cough over 1 to 2 days.  No reported fevers or chills.  No reported nausea or vomiting.  Per report, has been compliant with diuretic regimen at facility.  No reported chest pain.  No focal hemiparesis reported. Presented to the ER afebrile, hemodynamically stable.  Satting in the mid 80s on room air, transition to 2 L nasal cannula to keep O2 sats greater than 94%.  White count 9.5, hemoglobin 12.6, platelets 247, troponin 1 11-1 16, COVID flu and RSV negative, creatinine 1.9.  BNP 1600.  Chest x-ray with cardiomegaly and interstitial pulmonary edema.   Assessment and Plan: Acute on chronic systolic CHF (congestive heart failure)  2D echo March 2024 with EF of 35-40% as well as moderate mitral regurgitation Positive acute volume overload with noted BNP of 1600 as well as pulmonary edema chest x-ray S/p Lasix IV 40 mg x doses, s/p Lasix 40 mg IV bid on 9/28 Continue to follow volume status with diuresis Continue GDMT Cardiology consulted,  Follow recommendations BNP 1615--1437--1656  Started Bumex 2 mg p.o. daily, Toprol succinate 50 mg p.o. twice daily  Acute respiratory failure with hypoxia. Resolved  Decompensated respiratory status now requiring 2 L nasal cannula in the setting of acute on chronic HFrEF, BNP 1600 with noted pulmonary edema on chest x-ray No overt infection noted. S/p IV Lasix as above On 9/27, started Brovana and budesonide nebulizer twice daily.  Respiratory failure resolved, currently patient is saturating well on room air. Discontinued Brovana and budesonide, transition to [employer] inhaler on 9/29, continue DuoNeb as needed   Chronic kidney disease, stage 3a (HCC) Baseline creatinine 1.5-2.1 with GFR in the 30s to 40s Creatinine 1.86 today with GFR in the 30s Monitor renal function with diuresis 9/29, creatinine 2.01 slightly elevated today, discontinued Lasix   Type II diabetes mellitus with renal manifestations (HCC) SSI A1c   PAF (paroxysmal atrial fibrillation) Baseline atrial fibrillation EKG atrial flutter with rate in the 100s Titrate rate controlling regimen Continue Xarelto, started metoprolol 50 mg p.o. twice daily Patient was started on sotalol 80 mg p.o. daily by cardiology on 9/29 9/30 patient converted into sinus rhythm, cardiology recommended to continue sotalol and monitor on telemetry, possible discharge on 10/2 Follow-up cardiology recommendations   NSTEMI (non-ST elevated myocardial infarction) (HCC) Troponin 100s without active chest pain in setting of acute on chronic HFrEF EKG stable, Likely secondary demand ischemia Continue home regimen including baby aspirin Follow-up cardiology recommendations  Dementia and depression, continued Namenda, Prozac and Depakote  home dose Continue supportive care  Body mass index is 47.48 kg/m.  Interventions:  Diet: Heart healthy carb modified diet DVT Prophylaxis: Therapeutic Anticoagulation with Xarelto    Advance goals  of care discussion: DNR/DNI  Family Communication: family was not present at bedside, at the time of interview.  The pt provided permission to discuss medical plan with the family. Opportunity was given to ask question and all questions were answered satisfactorily.   Disposition:  Pt is Resident of [facility] , admitted with CHF, still has SOB, which precludes a safe discharge. Discharge back to  house, when stable and cleared by cardiology.  Subjective: No significant events overnight, patient was in deep sleep, he woke up briefly and dozed off again.  Patient nodded with head that he is fine, unable to offer any complaints.  Physical Exam: General: NAD, lying comfortably Appear in no distress, affect appropriate Eyes: PERRLA ENT: Oral Mucosa Clear, moist  Neck: no JVD,  Cardiovascular: S1 and S2 Present, no Murmur,  Respiratory: Equal air entry bilaterally, no crackles or wheezing appreciated Abdomen: Bowel Sound present, Soft and no tenderness,  Skin: no rashes Extremities: b/p BKA, no edema  Neurologic: without any new focal findings Gait not checked due to patient safety concerns  Vitals:   02/21/23 0545 02/21/23 0740 02/21/23 0900 02/21/23 1145  BP: 135/63 (!) 192/97 (!) 138/55 (!) 166/87  Pulse: (!) 59 60 (!) 55 (!) 55  Resp: 20 20  18   Temp: 98.3 F (36.8 C) 97.8 F (36.6 C)  98.1 F (36.7 C)  TempSrc:    Oral  SpO2: 91% 94%  (!) 85%  Weight:  86 kg    Height:        Intake/Output Summary (Last 24 hours) at 02/21/2023 1354 Last data filed at 02/21/2023 1010 Gross per 24 hour  Intake 360 ml  Output 800 ml  Net -440 ml   Filed Weights   02/17/23 0811 02/21/23 0740  Weight: 97.2 kg 86 kg    Data Reviewed: I have personally reviewed and interpreted daily labs, tele strips, imagings as discussed above. I reviewed all nursing notes, pharmacy notes, vitals, pertinent old records I have discussed plan of care as described above with RN and  patient/family.  CBC: Recent Labs  Lab 02/17/23 0813 02/18/23 0306 02/19/23 0745 02/20/23 0407 02/21/23 0307  WBC 9.5 8.1 9.6 10.6* 10.3  NEUTROABS 6.8  --   --   --   --   HGB 12.6* 12.0* 13.0 13.7 13.7  HCT 38.6* 36.0* 39.4 40.9 39.9  MCV 91.3 90.5 89.3 89.7 88.3  PLT 247 244 254 278 253   Basic Metabolic Panel: Recent Labs  Lab 02/18/23 0306 02/19/23 0745 02/20/23 0407 02/20/23 1921 02/21/23 0307  NA 139 137 140 139 141  K 3.9 4.0 3.7 4.5 5.0  CL 104 103 102 103 105  CO2 26 23 25 26 22   GLUCOSE 182* 162* 165* 221* 189*  BUN 39* 42* 43* 50* 54*  CREATININE 1.80* 1.86* 2.01* 2.02* 2.06*  CALCIUM 8.3* 8.3* 8.6* 8.5* 8.7*  MG 2.0  --  2.1  --  2.2  PHOS 3.8  --  4.5  --   --     Studies: No results found.  Scheduled Meds:  aspirin EC  81 mg Oral Daily   atorvastatin  80 mg Oral QHS   [START ON 02/22/2023] bumetanide  2 mg Oral Daily   divalproex  500 mg Oral Daily   FLUoxetine  60 mg Oral Daily   fluticasone furoate-vilanterol  1 puff Inhalation Daily   insulin aspart  0-15 Units Subcutaneous TID WC   insulin aspart  0-5 Units Subcutaneous QHS   insulin aspart  4 Units Subcutaneous TID WC   isosorbide mononitrate  60 mg Oral Daily   melatonin  5 mg Oral QHS   memantine  10 mg Oral BID   metoprolol succinate  25 mg Oral BID   mirabegron ER  75 mg Oral Daily   rivaroxaban  15 mg Oral Q supper   sodium chloride flush  3 mL Intravenous Q12H   sodium chloride flush  3 mL Intravenous Q12H   sotalol  80 mg Oral Q24H   Continuous Infusions:  sodium chloride     sodium chloride     PRN Meds: sodium chloride, sodium chloride, hydrALAZINE **OR** hydrALAZINE, ipratropium-albuterol, ondansetron **OR** ondansetron (ZOFRAN) IV, sodium chloride flush, sodium chloride flush  Time spent: 35 minutes  Author: Gillis Santa. MD [facility] 02/21/2023 1:54 PM  To reach On-call, see care teams to locate the attending and reach out to them via [email or web address]. If  7PM-7AM, please contact night-coverage If you still have difficulty reaching the attending provider, please page the [facility] (Director on Call) for [facility] on amion for assistance.

## 2023-02-21 NOTE — Progress Notes (Signed)
Pharmacy Consult for Sotalol Electrolyte Replacement  Pharmacy consulted to assist in monitoring and replacing electrolytes in this 88 y.o. male admitted on 02/17/2023 undergoing sotalol initiation . First sotalol dose: 80 mg   Labs:    Component Value Date/Time   K 4.5 02/20/2023 1921   K 3.5 01/20/2013 0255   MG 2.1 02/20/2023 0407     Plan: Potassium: K >/= 4: Appropriate to initiate Sotalol, no replacement needed    Magnesium: Mg >2: Appropriate to initiate Sotalol, no replacement needed     EKG:       9/29:  QT @ 1649 = 346       - Sotalol 80 mg given @ 2154              9/30:  QT @ 0036 = 418         -  OK to continue with sotalol    Thank you for allowing pharmacy to participate in this patient's care   Herbie Lehrmann D, PharmD Clinical Pharmacist 02/21/2023  12:51 AM

## 2023-02-21 NOTE — Progress Notes (Signed)
[facility] CLINIC CARDIOLOGY CONSULT NOTE       Patient ID: [name] MRN: [id number] DOB/AGE: 21-Feb-1935 40 y.o.  Admit date: 02/17/2023 Referring Physician Dr. Doree Albee Primary Physician Laren Everts, NP Primary Cardiologist Marijo Conception, NP Reason for Consultation AoCHF  HPI: [name] is a 88 y.o. male  with a past medical history of paroxysmal AF (Xarelto), chronic HFrEF (35-40%, rWMAs 07/2022), 1vCAD (CTO ostial LAD, 06/2019), CKD III, DM2, hx bilat BKA who presented to the ED on 02/17/2023 for chest pain and shortness of breath. Cardiology was consulted for further evaluation.   Interval history: -Patient feeling well this AM. He is without complaint. -In NSR on telemetry with rate in the 60s, denies palpitations.  -SOB much improved, patient weaned to room air. Net negative 3.3 L.  Review of systems complete and found to be negative unless listed above    Past Medical History:  Diagnosis Date   Acute respiratory failure with hypoxia (HCC) 02/08/2019   Arthritis    Atrial fibrillation (HCC)    Carcinoma of prostate (HCC)    CHF (congestive heart failure) (HCC)    Chronic kidney disease    Diabetes mellitus without complication (HCC)    ED (erectile dysfunction)    Frequent urination    Hyperlipidemia    Hypertension    Leukocytosis 09/06/2016   Moderate mitral insufficiency    Myocardial injury 08/14/2022   Peripheral vascular disease (HCC)    Pneumonia 04/2016   Prostate cancer (HCC)    Sleep apnea    OSA--USE C-PAP   Ulcer of left foot due to type 2 diabetes mellitus (HCC)    Urinary stress incontinence, male     Past Surgical History:  Procedure Laterality Date   AMPUTATION Left 01/12/2017   Procedure: AMPUTATION BELOW KNEE;  Surgeon: Annice Needy, MD;  Location: [facility] ORS;  Service: General;  Laterality: Left;   AMPUTATION Right 12/27/2018   Procedure: AMPUTATION BELOW KNEE;  Surgeon: Annice Needy, MD;  Location: [facility];   Service: General;  Laterality: Right;   APLIGRAFT PLACEMENT Left 10/15/2016   Procedure: APLIGRAFT PLACEMENT;  Surgeon: Recardo Evangelist, DPM;  Location: [facility];  Service: Podiatry;  Laterality: Left;  necrotic ulcer   CHOLECYSTECTOMY     COLONOSCOPY WITH PROPOFOL N/A 01/02/2019   Procedure: COLONOSCOPY WITH PROPOFOL;  Surgeon: Midge Minium, MD;  Location: [facility] ENDOSCOPY;  Service: Endoscopy;  Laterality: N/A;   COLONOSCOPY WITH PROPOFOL N/A 01/05/2019   Procedure: COLONOSCOPY WITH PROPOFOL;  Surgeon: Midge Minium, MD;  Location: [facility] ENDOSCOPY;  Service: Endoscopy;  Laterality: N/A;   ESOPHAGOGASTRODUODENOSCOPY (EGD) WITH PROPOFOL N/A 01/02/2019   Procedure: ESOPHAGOGASTRODUODENOSCOPY (EGD) WITH PROPOFOL;  Surgeon: Midge Minium, MD;  Location: [facility] ENDOSCOPY;  Service: Endoscopy;  Laterality: N/A;   EYE SURGERY Bilateral    Cataract Extraction with IOL   HIP ARTHROPLASTY Right 10/13/2018   Procedure: ARTHROPLASTY BIPOLAR HIP (HEMIARTHROPLASTY);  Surgeon: Deeann Saint, MD;  Location: [facility];  Service: Orthopedics;  Laterality: Right;   IRRIGATION AND DEBRIDEMENT FOOT Left 10/15/2016   Procedure: IRRIGATION AND DEBRIDEMENT FOOT-EXCISIONAL DEBRIDEMENT OF SKIN 3RD, 4TH AND 5TH TOES WITH APPLICATION OF APLIGRAFT;  Surgeon: Recardo Evangelist, DPM;  Location: [facility];  Service: Podiatry;  Laterality: Left;  necrotic,gangrene   IRRIGATION AND DEBRIDEMENT FOOT Left 12/09/2016   Procedure: IRRIGATION AND DEBRIDEMENT FOOT-MUSCLE/FASCIA, RESECTION OF FOURTH AND FIFTH METATARSAL NECROTIC BONE;  Surgeon: Recardo Evangelist, DPM;  Location: [facility];  Service: Podiatry;  Laterality: Left;   IRRIGATION AND  DEBRIDEMENT FOOT Right 12/23/2018   Procedure: IRRIGATION AND DEBRIDEMENT FOOT and wound vac application;  Surgeon: Gwyneth Revels, DPM;  Location: [facility];  Service: Podiatry;  Laterality: Right;   LEFT HEART CATH N/A 06/25/2019   Procedure: Left Heart Cath and Coronary Angiography;  Surgeon: Marcina Millard, MD;  Location: [facility] INV[facility]IVE CV [facility];  Service: Cardiovascular;  Laterality: N/A;   LEFT HEART CATH AND CORONARY ANGIOGRAPHY N/A 06/05/2018   Procedure: LEFT HEART CATH AND CORONARY ANGIOGRAPHY;  Surgeon: Alwyn Pea, MD;  Location: [facility];  Service: Cardiovascular;  Laterality: N/A;   LOWER EXTREMITY ANGIOGRAPHY Left 08/16/2016   Procedure: Lower Extremity Angiography;  Surgeon: Annice Needy, MD;  Location: [facility];  Service: Cardiovascular;  Laterality: Left;   LOWER EXTREMITY ANGIOGRAPHY Left 09/01/2016   Procedure: Lower Extremity Angiography;  Surgeon: Annice Needy, MD;  Location: [facility];  Service: Cardiovascular;  Laterality: Left;   LOWER EXTREMITY ANGIOGRAPHY Left 10/14/2016   Procedure: Lower Extremity Angiography;  Surgeon: Annice Needy, MD;  Location: [facility];  Service: Cardiovascular;  Laterality: Left;   LOWER EXTREMITY ANGIOGRAPHY Left 12/20/2016   Procedure: Lower Extremity Angiography;  Surgeon: Annice Needy, MD;  Location: [facility];  Service: Cardiovascular;  Laterality: Left;   LOWER EXTREMITY ANGIOGRAPHY Right 12/18/2018   Procedure: LOWER EXTREMITY ANGIOGRAPHY;  Surgeon: Annice Needy, MD;  Location: [facility];  Service: Cardiovascular;  Laterality: Right;   LOWER EXTREMITY INTERVENTION  09/01/2016   Procedure: Lower Extremity Intervention;  Surgeon: Annice Needy, MD;  Location: [facility];  Service: Cardiovascular;;   PROSTATE SURGERY     removal   TONSILLECTOMY     as a child    Medications Prior to Admission  Medication Sig Dispense Refill Last Dose   aspirin 81 MG EC tablet Take 81 mg by mouth daily.   02/17/2023 at 0617   atorvastatin (LIPITOR) 80 MG tablet Take 80 mg by mouth at bedtime.   02/16/2023 at 1823   bisacodyl (DULCOLAX) 5 MG EC tablet Take 5 mg by mouth daily as needed for moderate constipation.   prn at unk   divalproex (DEPAKOTE) 500 MG DR tablet Take 500 mg by  mouth daily.    02/17/2023 at 0617   ferrous sulfate 325 (65 FE) MG tablet Take 325 mg by mouth daily with breakfast.   02/16/2023 at 0917   FLUoxetine HCl 60 MG TABS Take 1 tablet by mouth daily.   02/17/2023 at 0617   furosemide (LASIX) 40 MG tablet Take 1 tablet (40 mg total) by mouth daily. 30 tablet 1 02/17/2023 at 0617   isosorbide mononitrate (IMDUR) 30 MG 24 hr tablet Take 60 mg by mouth daily.   02/17/2023 at 0617   LANTUS 100 UNIT/ML injection Inject 45 Units into the skin at bedtime.   02/16/2023 at 1822   memantine (NAMENDA) 10 MG tablet Take 10 mg by mouth 2 (two) times daily.   02/17/2023 at 0617   metoprolol tartrate (LOPRESSOR) 25 MG tablet Take 25 mg by mouth 2 (two) times daily.   02/17/2023 at 0617   Multiple Vitamin (MULTIVITAMIN) tablet Take 1 tablet by mouth daily.   02/17/2023 at 0617   nitroGLYCERIN (NITROSTAT) 0.4 MG SL tablet Place 1 tablet (0.4 mg total) under the tongue every 5 (five) minutes as needed for chest pain. 20 tablet 12 prn at unk   NOVOLOG FLEXPEN 100 UNIT/ML FlexPen  Inject 6 Units into the skin 3 (three) times daily with meals. (Patient taking differently: Inject 6-8 Units into the skin 3 (three) times daily with meals. 6 units at breakfast and dinner and 8 units at lunch) 15 mL 0 02/16/2023 at 1622   Rivaroxaban (XARELTO) 15 MG TABS tablet Take 1 tablet (15 mg total) by mouth daily with supper. Hold Xarelto for 3 days.  Start on January 09, 2019. 30 tablet 1 02/16/2023 at 1833   trimethoprim (TRIMPEX) 100 MG tablet Take 100 mg by mouth daily.   02/17/2023 at 0617   Vibegron (GEMTESA) 75 MG TABS Take 1 tablet (75 mg total) by mouth daily. 30 tablet 11 02/17/2023 at 0617   Ensure Max Protein (ENSURE MAX PROTEIN) LIQD Take 330 mLs (11 oz total) by mouth 2 (two) times daily. (Patient taking differently: Take 11 oz by mouth daily.)      FLUoxetine (PROZAC) 40 MG capsule Take 40 mg by mouth daily. (Patient not taking: Reported on 02/17/2023)   Not Taking   Insulin Glargine  (BASAGLAR KWIKPEN) 100 UNIT/ML Inject 25 Units into the skin daily. (Resume usual home dose if sugars are elevated) (Patient not taking: Reported on 02/17/2023)   Not Taking   polycarbophil (FIBERCON) 625 MG tablet Take 625 mg by mouth daily. (Patient not taking: Reported on 02/17/2023)   Not Taking   Social History   Socioeconomic History   Marital status: Married    Spouse name: Not on file   Number of children: Not on file   Years of education: Not on file   Highest education level: Not on file  Occupational History   Occupation: retired  Tobacco Use   Smoking status: Former    Current packs/day: 0.00    Types: Cigarettes    Quit date: 10/14/1994    Years since quitting: 28.3    Passive exposure: Past   Smokeless tobacco: Never  Vaping Use   Vaping status: Never Used  Substance and Sexual Activity   Alcohol use: No    Alcohol/week: 0.0 standard drinks of alcohol   Drug use: No   Sexual activity: Not on file  Other Topics Concern   Not on file  Social History Narrative   Lives at home with wife, has a wheelchair   Social Determinants of Health   Financial Resource Strain: Low Risk  (01/31/2018)   Received from [facility]   Overall Financial Resource Strain (CARDIA)    Difficulty of Paying Living Expenses: Not very hard  Food Insecurity: No Food Insecurity (08/15/2022)   Hunger Vital Sign    Worried About Running Out of Food in the Last Year: Never true    Ran Out of Food in the Last Year: Never true  Transportation Needs: No Transportation Needs (08/15/2022)   PRAPARE - Administrator, Civil Service (Medical): No    Lack of Transportation (Non-Medical): No  Physical Activity: Not on file  Stress: Not on file  Social Connections: Not on file  Intimate Partner Violence: Unknown (08/15/2022)   Humiliation, Afraid, Rape, and Kick questionnaire    Fear of Current or Ex-Partner: No    Emotionally Abused: No     Physically Abused: Not on file    Sexually Abused: No    Family History  Problem Relation Age of Onset   Hypertension Mother    Diabetes Mother    Prostate cancer Neg Hx    Bladder Cancer Neg Hx  Kidney disease Neg Hx      Vitals:   02/21/23 0545 02/21/23 0740 02/21/23 0900 02/21/23 1145  BP: 135/63 (!) 192/97 (!) 138/55 (!) 166/87  Pulse: (!) 59 60 (!) 55 (!) 55  Resp: 20 20  18   Temp: 98.3 F (36.8 C) 97.8 F (36.6 C)  98.1 F (36.7 C)  TempSrc:    Oral  SpO2: 91% 94%  (!) 85%  Weight:  86 kg    Height:        PHYSICAL EXAM General: Chronically ill appearing, well nourished, in no acute distress resting comfortably in hospital bed. HEENT: Normocephalic and atraumatic. Neck: No JVD.  Lungs: Normal respiratory effort on room air. Clear bilaterally to auscultation. No wheezes, crackles, rhonchi.  Heart: Irregularly irregular. Normal S1 and S2 without gallops or murmurs.  Abdomen: Non-distended appearing.  Msk: Normal strength and tone for age. Extremities: Warm and well perfused. No clubbing, cyanosis. S/p bilat BKA.  Neuro: Alert and oriented X 3. Psych: Answers questions appropriately.   Labs: Basic Metabolic Panel: Recent Labs    02/20/23 0407 02/20/23 1921 02/21/23 0307  NA 140 139 141  K 3.7 4.5 5.0  CL 102 103 105  CO2 25 26 22   GLUCOSE 165* 221* 189*  BUN 43* 50* 54*  CREATININE 2.01* 2.02* 2.06*  CALCIUM 8.6* 8.5* 8.7*  MG 2.1  --  2.2  PHOS 4.5  --   --    Liver Function Tests: No results for input(s): "AST", "ALT", "ALKPHOS", "BILITOT", "PROT", "ALBUMIN" in the last 72 hours.  No results for input(s): "LIPASE", "AMYLASE" in the last 72 hours. CBC: Recent Labs    02/20/23 0407 02/21/23 0307  WBC 10.6* 10.3  HGB 13.7 13.7  HCT 40.9 39.9  MCV 89.7 88.3  PLT 278 253   Cardiac Enzymes: No results for input(s): "CKTOTAL", "CKMB", "CKMBINDEX", "TROPONINIHS" in the last 72 hours.  BNP: Recent Labs    02/20/23 0407 02/21/23 0307  BNP  1,607.8* 1,656.9*   D-Dimer: No results for input(s): "DDIMER" in the last 72 hours. Hemoglobin A1C: No results for input(s): "HGBA1C" in the last 72 hours. Fasting Lipid Panel: No results for input(s): "CHOL", "HDL", "LDLCALC", "TRIG", "CHOLHDL", "LDLDIRECT" in the last 72 hours. Thyroid Function Tests: No results for input(s): "TSH", "T4TOTAL", "T3FREE", "THYROIDAB" in the last 72 hours.  Invalid input(s): "FREET3" Anemia Panel: No results for input(s): "VITAMINB12", "FOLATE", "FERRITIN", "TIBC", "IRON", "RETICCTPCT" in the last 72 hours.   Radiology: [facility] Chest Port 1 View  Result Date: 02/17/2023 CLINICAL DATA:  Right chest pain and shortness of breath EXAM: PORTABLE CHEST 1 VIEW COMPARISON:  Chest radiograph 08/14/2022 FINDINGS: The heart is enlarged.  The upper mediastinal contours are normal. There is vascular congestion with possible mild pulmonary interstitial edema. There is no focal airspace consolidation. There is no pleural effusion or pneumothorax There is no acute osseous abnormality. IMPRESSION: Cardiomegaly with vascular congestion and possible mild pulmonary interstitial edema. Electronically Signed   By: Lesia Hausen M.D.   On: 02/17/2023 09:33    ECHO 07/2022:  1. Left ventricular ejection fraction, by estimation, is 35-40%. The left ventricle has moderately decreased function. The LV demonstrates regional wall motion abnormalities (see scoring diagram/findings for description). Left ventricular  diastolic parameters were normal.   2. Right ventricular systolic function is normal. The right ventricular  size is normal.   3. Left atrial size was mild to moderately dilated.   4. The mitral valve is normal in structure. Mild to  moderate mitral valve  regurgitation. No evidence of mitral stenosis.   5. The aortic valve is normal in structure. Aortic valve regurgitation is  not visualized. No aortic stenosis is present.   6. The inferior vena cava is normal in size with  greater than 50%  respiratory variability, suggesting right atrial pressure of 3 mmHg.   TELEMETRY reviewed by me 02/21/2023: NSR PACs rate 60s  EKG reviewed by me: atrial fibrillation rate 113 bpm  Data reviewed by me 02/21/2023: last 24h vitals tele labs imaging I/O hospitalist progress note  Principal Problem:   CHF exacerbation (HCC) Active Problems:   NSTEMI (non-ST elevated myocardial infarction) (HCC)   Acute respiratory failure with hypoxia (HCC)   PAF (paroxysmal atrial fibrillation) (HCC)   Chronic kidney disease, stage 3a (HCC)   Acute on chronic systolic CHF (congestive heart failure) (HCC)   Type II diabetes mellitus with renal manifestations (HCC)    ASSESSMENT AND PLAN:  [name] is a 88 y.o. male  with a past medical history of paroxysmal AF (Xarelto), chronic HFrEF (35-40%, rWMAs 07/2022), 1vCAD (CTO ostial LAD, 06/2019), CKD III, DM2, hx bilat BKA who presented to the ED on 02/17/2023 for chest pain and shortness of breath. Cardiology was consulted for further evaluation.   # Acute on chronic HFrEF (EF 35-40% in 07/2022) # Acute hypoxic respiratory failure # Hypertension Patient presenting with episode of chest pain and shortness of breath which resolved with SL nitroglycerin. Noted to be hypoxic in the 80s and placed on supplemental O2. BNP elevated to 1600 on admission. Troponins elevated but flat. Symptoms resolved and patient now weaned to room air. -S/p IV lasix. Start bumex 2 mg daily.  -Change metoprolol to succinate 50 mg bid. Further additions of GDMT limited by renal function.   # Paroxysmal atrial fibrillation Patient in AF on presentation with rates in the 110s now improved to the 90s. -Continue renally-dosed sotalol 80 mg daily as per pharmacy recommendations for rhythm control. Patient needs to be monitored for 3 total days after initiation of sotalol with daily EKGs 2 hours after sotalol dose. Anticipate he will be ready for discharge on 10/2 if  he remains stable on sotalol.  -Metoprolol as above for rate control.  -Continue home xarelto 15 mg daily (reduced dose appropriate given renal function).  # Demand ischemia # Coronary artery disease  Patient with hx of CAD medically managed with troponin elevation 111 > 116 on presentation in the setting of acute heart failure and acute respiratory failure.  -Troponin elevation most consistent with demand/supply mismatch and not ACS. -Continue home asa, atorvastatin.  -Continue imdur 30 mg daily.   # Chronic kidney disease stage IIIa Baseline between (1.5-2.1). Cr. 2.06 this AM. -Continue to monitor renal function with diuresis.  -Monitor and replenish electrolytes for a goal K >4, Mag >2    This patient's plan of care was discussed and created with Dr. Corky Sing and he is in agreement.  Signed: Gale Journey, PA-C  02/21/2023, 12:32 PM [facility]

## 2023-02-21 NOTE — Plan of Care (Signed)
  Problem: Education: Goal: Ability to demonstrate management of disease process will improve Outcome: Progressing Goal: Ability to verbalize understanding of medication therapies will improve Outcome: Progressing Goal: Individualized Educational Video(s) Outcome: Progressing   Problem: Activity: Goal: Capacity to carry out activities will improve Outcome: Progressing   

## 2023-02-22 DIAGNOSIS — I509 Heart failure, unspecified: Secondary | ICD-10-CM | POA: Diagnosis not present

## 2023-02-22 DIAGNOSIS — I4581 Long QT syndrome: Secondary | ICD-10-CM

## 2023-02-22 LAB — CBC
HCT: 41.7 % (ref 39.0–52.0)
Hemoglobin: 13.8 g/dL (ref 13.0–17.0)
MCH: 29.3 pg (ref 26.0–34.0)
MCHC: 33.1 g/dL (ref 30.0–36.0)
MCV: 88.5 fL (ref 80.0–100.0)
Platelets: 265 10*3/uL (ref 150–400)
RBC: 4.71 MIL/uL (ref 4.22–5.81)
RDW: 14.6 % (ref 11.5–15.5)
WBC: 9.7 10*3/uL (ref 4.0–10.5)
nRBC: 0 % (ref 0.0–0.2)

## 2023-02-22 LAB — BASIC METABOLIC PANEL
Anion gap: 10 (ref 5–15)
BUN: 44 mg/dL — ABNORMAL HIGH (ref 8–23)
CO2: 22 mmol/L (ref 22–32)
Calcium: 8.6 mg/dL — ABNORMAL LOW (ref 8.9–10.3)
Chloride: 106 mmol/L (ref 98–111)
Creatinine, Ser: 1.59 mg/dL — ABNORMAL HIGH (ref 0.61–1.24)
GFR, Estimated: 42 mL/min — ABNORMAL LOW (ref >60)
Glucose, Bld: 158 mg/dL — ABNORMAL HIGH (ref 70–99)
Potassium: 4.3 mmol/L (ref 3.5–5.1)
Sodium: 138 mmol/L (ref 135–145)

## 2023-02-22 LAB — GLUCOSE, CAPILLARY
Glucose-Capillary: 155 mg/dL — ABNORMAL HIGH (ref 70–99)
Glucose-Capillary: 244 mg/dL — ABNORMAL HIGH (ref 70–99)
Glucose-Capillary: 153 mg/dL — ABNORMAL HIGH (ref 70–99)
Glucose-Capillary: 121 mg/dL — ABNORMAL HIGH (ref 70–99)

## 2023-02-22 LAB — MAGNESIUM: Magnesium: 2.4 mg/dL (ref 1.7–2.4)

## 2023-02-22 MED ORDER — INSULIN ASPART 100 UNIT/ML IJ SOLN
3.0000 [IU] | Freq: Three times a day (TID) | INTRAMUSCULAR | Status: DC
  Administered 2023-02-22: 3 [IU] via SUBCUTANEOUS
  Filled 2023-02-22: qty 1

## 2023-02-22 MED ORDER — INSULIN GLARGINE-YFGN 100 UNIT/ML ~~LOC~~ SOLN
10.0000 [IU] | Freq: Every day | SUBCUTANEOUS | Status: DC
  Administered 2023-02-22 – 2023-02-24 (×3): 10 [IU] via SUBCUTANEOUS
  Filled 2023-02-22 (×4): qty 0.1

## 2023-02-22 MED ORDER — INSULIN ASPART 100 UNIT/ML IJ SOLN
0.0000 [IU] | Freq: Three times a day (TID) | INTRAMUSCULAR | Status: DC
  Administered 2023-02-22 – 2023-02-23 (×2): 2 [IU] via SUBCUTANEOUS
  Administered 2023-02-24: 3 [IU] via SUBCUTANEOUS
  Administered 2023-02-24 – 2023-02-25 (×3): 1 [IU] via SUBCUTANEOUS
  Administered 2023-02-25: 2 [IU] via SUBCUTANEOUS
  Filled 2023-02-22 (×4): qty 1

## 2023-02-22 MED ORDER — INSULIN ASPART 100 UNIT/ML IJ SOLN
3.0000 [IU] | Freq: Three times a day (TID) | INTRAMUSCULAR | Status: DC
  Administered 2023-02-22 – 2023-02-25 (×7): 3 [IU] via SUBCUTANEOUS
  Filled 2023-02-22 (×6): qty 1

## 2023-02-22 MED ORDER — INSULIN ASPART 100 UNIT/ML IJ SOLN
0.0000 [IU] | Freq: Every day | INTRAMUSCULAR | Status: DC
  Administered 2023-02-24: 3 [IU] via SUBCUTANEOUS
  Filled 2023-02-22: qty 1

## 2023-02-22 MED ORDER — INSULIN ASPART 100 UNIT/ML IJ SOLN: 3.0000 [IU] | Freq: Three times a day (TID) | INTRAMUSCULAR | Status: DC

## 2023-02-22 MED ORDER — METOPROLOL SUCCINATE ER 50 MG PO TB24
75.0000 mg | ORAL_TABLET | Freq: Two times a day (BID) | ORAL | Status: DC
  Administered 2023-02-22 – 2023-02-25 (×5): 75 mg via ORAL
  Filled 2023-02-22 (×7): qty 1

## 2023-02-22 MED ORDER — METOPROLOL SUCCINATE ER 50 MG PO TB24: 50.0000 mg | ORAL_TABLET | Freq: Two times a day (BID) | ORAL | Status: DC

## 2023-02-22 MED ORDER — INSULIN ASPART 100 UNIT/ML IJ SOLN
0.0000 [IU] | Freq: Three times a day (TID) | INTRAMUSCULAR | Status: DC
  Administered 2023-02-22: 3 [IU] via SUBCUTANEOUS
  Filled 2023-02-22: qty 1

## 2023-02-22 MED ORDER — INSULIN ASPART 100 UNIT/ML IJ SOLN: 0.0000 [IU] | Freq: Three times a day (TID) | INTRAMUSCULAR | Status: DC

## 2023-02-22 NOTE — Evaluation (Addendum)
Physical Therapy Evaluation Patient Details Name: [name] MRN: [id number] DOB: May 08, 1935 Today's Date: 02/22/2023  History of Present Illness  [name] is a 88 y.o. male with medical history significant of HFrEF, atrial fibrillation, stage III CKD, hypertension, hyperlipidemia, type 2 diabetes presenting with acute respiratory failure with hypoxia, acute on chronic HFrEF. Chest x-ray with cardiomegaly and interstitial pulmonary edema.  Clinical Impression   Pt is seen by OT and PT for co-evaluation to address functional mobility. Pt is received in bed, he is agreeable to therapy. At baseline, Pt reports able to amb short distances with grabbing on furniture, assistance from son to transfer, and self-propulsion on w/c for long amb distance. PT contacted ALF to verify Pt's subjective intake and ALF reports Pt does not amb short distances and requires frequent assist with ADLs/IADLs; additionally reports Pt's son does not visit every day and spouse has dementia. Pt performs bed mobility min A and transfers min A with use of B prostheses and RW. Pt demonstrates fluctuating cognition throughout session with reports of no use of AD to then stating use of RW for transfers. Pt requires frequent cuing for hand placement and head-over-toes relationship to facilitate STS mobility. During session, Pt required reapplication of 1L [place] due to SpO2 in low 80s following bed mobility, which improved to SpO2 94% by end of session. Pt would benefit from skilled PT to address above deficits and promote optimal return to PLOF.      If plan is discharge home, recommend the following: A little help with walking and/or transfers;A little help with bathing/dressing/bathroom;Assistance with cooking/housework;Help with stairs or ramp for entrance;Assist for transportation;Supervision due to cognitive status   Can travel by private vehicle   No    Equipment Recommendations None recommended by PT   Recommendations for Other Services       Functional Status Assessment Patient has had a recent decline in their functional status and demonstrates the ability to make significant improvements in function in a reasonable and predictable amount of time.     Precautions / Restrictions Precautions Precautions: Fall Precaution Comments: B BKA, wears prosthesis Required Braces or Orthoses: Other Brace (B prosthesis) Other Brace: B prosthesis Restrictions Weight Bearing Restrictions: No      Mobility  Bed Mobility Overal bed mobility: Needs Assistance Bed Mobility: Supine to Sit     Supine to sit: HOB elevated, Used rails, Min assist     General bed mobility comments: min A for assist in getting completely upright at EOB    Transfers Overall transfer level: Needs assistance Equipment used: Rolling walker (2 wheels), 2 person hand held assist Transfers: Sit to/from Stand, Bed to chair/wheelchair/BSC Sit to Stand: +2 physical assistance, Mod assist, From elevated surface   Step pivot transfers: Min assist       General transfer comment: attempted STS with 2 HHA due to reports of not using RW but required mod A x2 with unsuccessful completion of task; attempted second STS with RW following reports he does use RW requiring min A with elevated surface; frequent cuing necesarry for technique and hand placement to perform STS    Ambulation/Gait               General Gait Details: Deferred at this time secondary to increased fatigue  Stairs            Wheelchair Mobility     Tilt Bed    Modified Rankin (Stroke Patients Only)  Balance Overall balance assessment: Needs assistance Sitting-balance support: Feet unsupported, No upper extremity supported Sitting balance-Leahy Scale: Normal Sitting balance - Comments: Pt able to maintain seated EOB balance during functional activities with no LOB noted   Standing balance support: Bilateral upper extremity  supported, During functional activity Standing balance-Leahy Scale: Good Standing balance comment: Pt able to maintain static standing balance using RW with cuing for upright posture                             Pertinent Vitals/Pain Pain Assessment Pain Assessment: No/denies pain    Home Living Family/patient expects to be discharged to:: Assisted living [facility])                 Home Equipment: Wheelchair - Forensic psychologist (2 wheels);BSC/3in1;Grab bars - tub/shower;Grab bars - toilet Additional Comments: Son comes over in the mornings to assist with ADLs    Prior Function Prior Level of Function : Needs assist       Physical Assist : Mobility (physical) Mobility (physical): Transfers;Gait   Mobility Comments: Pt states limited household ambulation (furniture walking ?), uses RW, can don B prostheses ADLs Comments: Son comes over in the mornings to assist with ADLs     Extremity/Trunk Assessment   Upper Extremity Assessment Upper Extremity Assessment: Defer to OT evaluation;Generalized weakness    Lower Extremity Assessment Lower Extremity Assessment: Generalized weakness       Communication   Communication Communication: Other (comment) (HOH) Cueing Techniques: Verbal cues  Cognition Arousal: Alert Behavior During Therapy: WFL for tasks assessed/performed Overall Cognitive Status: History of cognitive impairments - at baseline                                 General Comments: A&Ox4, but cognition varied throughout session. Responses took excessive time, pt often not responding to questions        General Comments General comments (skin integrity, edema, etc.): B residual limbs appears good in skin integrity; SpO2 95% on RA pre-mobility, SpO2 82-88% on RA at EOB-reapplied 1L [place] which improved SpO2 to 91%, SpO2 94% at end of session    Exercises     Assessment/Plan    PT Assessment Patient needs continued PT  services  PT Problem List Decreased strength;Decreased mobility;Decreased activity tolerance;Decreased balance;Decreased coordination;Decreased cognition       PT Treatment Interventions DME instruction;Gait training;Functional mobility training;Therapeutic activities;Therapeutic exercise;Balance training;Neuromuscular re-education    PT Goals (Current goals can be found in the Care Plan section)  Acute Rehab PT Goals Patient Stated Goal: to get better PT Goal Formulation: With patient Time For Goal Achievement: 03/08/23 Potential to Achieve Goals: Good    Frequency Min 1X/week     Co-evaluation PT/OT/SLP Co-Evaluation/Treatment: Yes Reason for Co-Treatment: Complexity of the patient's impairments (multi-system involvement);For patient/therapist safety;To address functional/ADL transfers PT goals addressed during session: Mobility/safety with mobility;Balance OT goals addressed during session: ADL's and self-care;Proper use of Adaptive equipment and DME       AM-PAC PT "6 Clicks" Mobility  Outcome Measure Help needed turning from your back to your side while in a flat bed without using bedrails?: A Little Help needed moving from lying on your back to sitting on the side of a flat bed without using bedrails?: A Little Help needed moving to and from a bed to a chair (including a wheelchair)?: A Little Help needed  standing up from a chair using your arms (e.g., wheelchair or bedside chair)?: A Little Help needed to walk in hospital room?: A Lot Help needed climbing 3-5 steps with a railing? : A Lot 6 Click Score: 16    End of Session Equipment Utilized During Treatment: Gait belt;Other (comment) Activity Tolerance: Patient limited by fatigue Patient left: in chair;with call bell/phone within reach;with chair alarm set Nurse Communication: Mobility status PT Visit Diagnosis: Unsteadiness on feet (R26.81);Muscle weakness (generalized) (M62.81)    Time: 4696-2952 PT Time  Calculation (min) (ACUTE ONLY): 40 min   Charges:                 Elmon Else, SPT   Hani Campusano 02/22/2023, 12:14 PM

## 2023-02-22 NOTE — Plan of Care (Signed)
  Problem: Clinical Measurements: Goal: Will remain free from infection Outcome: Progressing   Problem: Clinical Measurements: Goal: Respiratory complications will improve Outcome: Progressing   Problem: Clinical Measurements: Goal: Cardiovascular complication will be avoided Outcome: Progressing   Problem: Elimination: Goal: Will not experience complications related to bowel motility Outcome: Progressing   Problem: Elimination: Goal: Will not experience complications related to urinary retention Outcome: Progressing   Problem: Pain Managment: Goal: General experience of comfort will improve Outcome: Progressing

## 2023-02-22 NOTE — Progress Notes (Signed)
Pharmacy Consult for Sotalol Electrolyte Replacement  Pharmacy consulted to assist in monitoring and replacing electrolytes in this 88 y.o. male admitted on 02/17/2023 undergoing sotalol initiation . First sotalol dose: 80 mg   Labs:    Component Value Date/Time   K 4.3 02/22/2023 0609   K 3.5 01/20/2013 0255   MG 2.4 02/22/2023 0609     Plan: Potassium: K >/= 4: Appropriate to initiate Sotalol, no replacement needed    Magnesium: Mg >2: Appropriate to initiate Sotalol, no replacement needed     Thank you for allowing pharmacy to participate in this patient's care   Roran Wegner Rodriguez-Guzman PharmD, BCPS 02/22/2023 3:33 PM

## 2023-02-22 NOTE — Evaluation (Signed)
Occupational Therapy Evaluation Patient Details Name: [name] MRN: [id number] DOB: [birth year] Today's Date: 02/22/2023   History of Present Illness [name] is a 88 y.o. male with medical history significant of HFrEF, atrial fibrillation, stage III CKD, hypertension, hyperlipidemia, type 2 diabetes presenting with acute respiratory failure with hypoxia, acute on chronic HFrEF. Chest x-ray with cardiomegaly and interstitial pulmonary edema.   Clinical Impression   Pt received in bed, agreeable to OT/PT co-eval. Pt A&Ox4. Pt poor historian - PT contacted ALF to verify subjective reports as pt states he was ambulating short distances using furniture, using RW and having son assist daily with ADLs. Per ALF report, pt requires physical assist for all ADL/IADL performance and does not walk (at wc level).   Pt performing bed mobility minA, transfers minA with use of bilat prostheses and RW. Good sitting balance to don prosthesis EOB. Setup required for washing face/hands/UB, however, pt requires minA to down gown. OT provides verbal cuing to don gown, and pt requires increased time for processing but ultimately unable to initiate task performance. Presents with generalized weakness, impaired executive functioning, impaired memory, and decreased awareness of deficits. On RA start of session with sats 95%+, SpO2 declining to low 80s after mobility with 1L via [place] applied. Pt left on 94% end of session, chair alarm activated and RN alerted to mobility status with application of O2. Pt will continue to benefit from skilled OT services to address functional deficits listed in flowsheet, and anticipate requiring 24/7 supervision at d/c due to physical and cognitive impairments.        If plan is discharge home, recommend the following: A lot of help with walking and/or transfers;A lot of help with bathing/dressing/bathroom;Assistance with cooking/housework;Direct supervision/assist for  medications management;Direct supervision/assist for financial management;Assist for transportation;Supervision due to cognitive status    Functional Status Assessment  Patient has had a recent decline in their functional status and demonstrates the ability to make significant improvements in function in a reasonable and predictable amount of time.  Equipment Recommendations  None recommended by OT    Recommendations for Other Services Other (comment)     Precautions / Restrictions Precautions Precautions: Fall Precaution Comments: B BKA, wears prosthesis Required Braces or Orthoses: Other Brace (B prosthesis) Other Brace: B prosthesis Restrictions Weight Bearing Restrictions: No      Mobility Bed Mobility Overal bed mobility: Needs Assistance Bed Mobility: Supine to Sit     Supine to sit: HOB elevated, Used rails     General bed mobility comments: minA    Transfers Overall transfer level: Needs assistance Equipment used: Rolling walker (2 wheels) Transfers: Sit to/from Stand, Bed to chair/wheelchair/BSC Sit to Stand: +2 physical assistance, Mod assist, From elevated surface     Step pivot transfers: Min assist     General transfer comment: attempts STS with 2 person HHA, unsuccessful, MIN A from elevated surface      Balance Overall balance assessment: Needs assistance Sitting-balance support: Feet unsupported, No upper extremity supported Sitting balance-Leahy Scale: Normal     Standing balance support: No upper extremity supported, During functional activity, Reliant on assistive device for balance Standing balance-Leahy Scale: Good                             ADL either performed or assessed with clinical judgement   ADL Overall ADL's : Needs assistance/impaired Eating/Feeding: Set up   Grooming: Wash/dry face;Wash/dry hands;Set up  Upper Body Bathing: Set up;Sitting       Upper Body Dressing : Minimal assistance;Sitting Upper Body  Dressing Details (indicate cue type and reason): Pt demonstrates cognitive deficits - cannot don gown without OT starting task Lower Body Dressing: Contact guard assist;Sit to/from stand Lower Body Dressing Details (indicate cue type and reason): dons bilateral prosthesis             Functional mobility during ADLs: Minimal assistance;Rolling walker (2 wheels);+2 for safety/equipment General ADL Comments: Pt requires visual/verbal cuing and demo for efficient ADL performance, MIN A to don gown, setup to sponge bathe from recliner, transfers from EOB after prosthesis donned with +2 for safety, MIN A using RW.     Vision Baseline Vision/History: 1 Wears glasses              Pertinent Vitals/Pain Pain Assessment Pain Assessment: No/denies pain     Extremity/Trunk Assessment Upper Extremity Assessment Upper Extremity Assessment: Defer to OT evaluation;Generalized weakness   Lower Extremity Assessment Lower Extremity Assessment: Generalized weakness       Communication Communication Communication: Other (comment) (HOH) Cueing Techniques: Verbal cues   Cognition Arousal: Alert Behavior During Therapy: WFL for tasks assessed/performed Overall Cognitive Status: History of cognitive impairments - at baseline                                 General Comments: A&Ox4, but cognition varied throughout session. Responses took excessive time, pt often not responding to questions     General Comments  B residual limbs appears good in skin integrity; SpO2 95% on RA pre-mobility, SpO2 82-88% on RA at EOB-reapplied 1L [place] which improved SpO2 to 91%, SpO2 94% at end of session            Home Living Family/patient expects to be discharged to:: Assisted living [facility])                             Home Equipment: Wheelchair - Forensic psychologist (2 wheels);BSC/3in1;Grab bars - tub/shower;Grab bars - toilet   Additional Comments: Son comes over in  the mornings to assist with ADLs      Prior Functioning/Environment Prior Level of Function : Needs assist       Physical Assist : Mobility (physical) Mobility (physical): Transfers;Gait   Mobility Comments: Pt states limited household ambulation (furniture walking ?), uses RW, can don B prostheses ADLs Comments: Son comes over in the mornings to assist with ADLs        OT Problem List: Decreased strength;Decreased activity tolerance;Decreased range of motion;Impaired balance (sitting and/or standing);Decreased cognition;Decreased safety awareness      OT Treatment/Interventions: Self-care/ADL training;Therapeutic exercise;Energy conservation;DME and/or AE instruction;Therapeutic activities;Patient/family education;Cognitive remediation/compensation;Balance training    OT Goals(Current goals can be found in the care plan section) Acute Rehab OT Goals OT Goal Formulation: With patient Time For Goal Achievement: 03/08/23 Potential to Achieve Goals: Good  OT Frequency: Min 1X/week    Co-evaluation PT/OT/SLP Co-Evaluation/Treatment: Yes Reason for Co-Treatment: Complexity of the patient's impairments (multi-system involvement);For patient/therapist safety;To address functional/ADL transfers PT goals addressed during session: Mobility/safety with mobility;Balance OT goals addressed during session: ADL's and self-care;Proper use of Adaptive equipment and DME      AM-PAC OT "6 Clicks" Daily Activity     Outcome Measure Help from another person eating meals?: None Help from another person taking care of personal grooming?:  A Little Help from another person toileting, which includes using toliet, bedpan, or urinal?: A Lot Help from another person bathing (including washing, rinsing, drying)?: A Lot Help from another person to put on and taking off regular upper body clothing?: A Little Help from another person to put on and taking off regular lower body clothing?: A Lot 6 Click  Score: 16   End of Session Equipment Utilized During Treatment: Rolling walker (2 wheels);Gait belt;Oxygen Nurse Communication: Mobility status;Other (comment) (O2)  Activity Tolerance: Patient tolerated treatment well Patient left: in chair;with call bell/phone within reach;with chair alarm set  OT Visit Diagnosis: Muscle weakness (generalized) (M62.81);Unsteadiness on feet (R26.81);Other abnormalities of gait and mobility (R26.89)                Time: 1010-1050 OT Time Calculation (min): 40 min Charges:  OT General Charges $OT Visit: 1 Visit OT Evaluation $OT Eval Low Complexity: 1 Low OT Treatments $Self Care/Home Management : 8-22 mins Safina Huard L. Chantil Bari, OTR/L  02/22/23, 1:12 PM

## 2023-02-22 NOTE — Progress Notes (Signed)
[facility] CLINIC CARDIOLOGY CONSULT NOTE       Patient ID: [name] MRN: [id number] DOB/AGE: 07-20-1934 88 y.o.  Admit date: 02/17/2023 Referring Physician Dr. Doree Albee Primary Physician Laren Everts, NP Primary Cardiologist Marijo Conception, NP Reason for Consultation AoCHF  HPI: [name] is a 88 y.o. male  with a past medical history of paroxysmal AF (Xarelto), chronic HFrEF (35-40%, rWMAs 07/2022), 1vCAD (CTO ostial LAD, 06/2019), CKD III, DM2, hx bilat BKA who presented to the ED on 02/17/2023 for chest pain and shortness of breath. Cardiology was consulted for further evaluation.   Interval history: -Patient seen and examined this AM. States he feels much improved overall.  -He denies any chest pain, shortness of breath, or palpitations. -Maintaining in NSR on tele, QTc prolonged on EKG   Review of systems complete and found to be negative unless listed above    Past Medical History:  Diagnosis Date   Acute respiratory failure with hypoxia (HCC) 02/08/2019   Arthritis    Atrial fibrillation (HCC)    Carcinoma of prostate (HCC)    CHF (congestive heart failure) (HCC)    Chronic kidney disease    Diabetes mellitus without complication (HCC)    ED (erectile dysfunction)    Frequent urination    Hyperlipidemia    Hypertension    Leukocytosis 09/06/2016   Moderate mitral insufficiency    Myocardial injury 08/14/2022   Peripheral vascular disease (HCC)    Pneumonia 04/2016   Prostate cancer (HCC)    Sleep apnea    OSA--USE C-PAP   Ulcer of left foot due to type 2 diabetes mellitus (HCC)    Urinary stress incontinence, male     Past Surgical History:  Procedure Laterality Date   AMPUTATION Left 01/12/2017   Procedure: AMPUTATION BELOW KNEE;  Surgeon: Annice Needy, MD;  Location: [facility] ORS;  Service: General;  Laterality: Left;   AMPUTATION Right 12/27/2018   Procedure: AMPUTATION BELOW KNEE;  Surgeon: Annice Needy, MD;  Location: [facility];   Service: General;  Laterality: Right;   APLIGRAFT PLACEMENT Left 10/15/2016   Procedure: APLIGRAFT PLACEMENT;  Surgeon: Recardo Evangelist, DPM;  Location: [facility];  Service: Podiatry;  Laterality: Left;  necrotic ulcer   CHOLECYSTECTOMY     COLONOSCOPY WITH PROPOFOL N/A 01/02/2019   Procedure: COLONOSCOPY WITH PROPOFOL;  Surgeon: Midge Minium, MD;  Location: [facility] ENDOSCOPY;  Service: Endoscopy;  Laterality: N/A;   COLONOSCOPY WITH PROPOFOL N/A 01/05/2019   Procedure: COLONOSCOPY WITH PROPOFOL;  Surgeon: Midge Minium, MD;  Location: [facility] ENDOSCOPY;  Service: Endoscopy;  Laterality: N/A;   ESOPHAGOGASTRODUODENOSCOPY (EGD) WITH PROPOFOL N/A 01/02/2019   Procedure: ESOPHAGOGASTRODUODENOSCOPY (EGD) WITH PROPOFOL;  Surgeon: Midge Minium, MD;  Location: [facility] ENDOSCOPY;  Service: Endoscopy;  Laterality: N/A;   EYE SURGERY Bilateral    Cataract Extraction with IOL   HIP ARTHROPLASTY Right 10/13/2018   Procedure: ARTHROPLASTY BIPOLAR HIP (HEMIARTHROPLASTY);  Surgeon: Deeann Saint, MD;  Location: [facility];  Service: Orthopedics;  Laterality: Right;   IRRIGATION AND DEBRIDEMENT FOOT Left 10/15/2016   Procedure: IRRIGATION AND DEBRIDEMENT FOOT-EXCISIONAL DEBRIDEMENT OF SKIN 3RD, 4TH AND 5TH TOES WITH APPLICATION OF APLIGRAFT;  Surgeon: Recardo Evangelist, DPM;  Location: [facility];  Service: Podiatry;  Laterality: Left;  necrotic,gangrene   IRRIGATION AND DEBRIDEMENT FOOT Left 12/09/2016   Procedure: IRRIGATION AND DEBRIDEMENT FOOT-MUSCLE/FASCIA, RESECTION OF FOURTH AND FIFTH METATARSAL NECROTIC BONE;  Surgeon: Recardo Evangelist, DPM;  Location: [facility];  Service: Podiatry;  Laterality: Left;   IRRIGATION AND  DEBRIDEMENT FOOT Right 12/23/2018   Procedure: IRRIGATION AND DEBRIDEMENT FOOT and wound vac application;  Surgeon: Gwyneth Revels, DPM;  Location: [facility];  Service: Podiatry;  Laterality: Right;   LEFT HEART CATH N/A 06/25/2019   Procedure: Left Heart Cath and Coronary Angiography;  Surgeon: Marcina Millard, MD;  Location: [facility];  Service: Cardiovascular;  Laterality: N/A;   LEFT HEART CATH AND CORONARY ANGIOGRAPHY N/A 06/05/2018   Procedure: LEFT HEART CATH AND CORONARY ANGIOGRAPHY;  Surgeon: Alwyn Pea, MD;  Location: [facility];  Service: Cardiovascular;  Laterality: N/A;   LOWER EXTREMITY ANGIOGRAPHY Left 08/16/2016   Procedure: Lower Extremity Angiography;  Surgeon: Annice Needy, MD;  Location: [facility];  Service: Cardiovascular;  Laterality: Left;   LOWER EXTREMITY ANGIOGRAPHY Left 09/01/2016   Procedure: Lower Extremity Angiography;  Surgeon: Annice Needy, MD;  Location: [facility];  Service: Cardiovascular;  Laterality: Left;   LOWER EXTREMITY ANGIOGRAPHY Left 10/14/2016   Procedure: Lower Extremity Angiography;  Surgeon: Annice Needy, MD;  Location: [facility];  Service: Cardiovascular;  Laterality: Left;   LOWER EXTREMITY ANGIOGRAPHY Left 12/20/2016   Procedure: Lower Extremity Angiography;  Surgeon: Annice Needy, MD;  Location: [facility];  Service: Cardiovascular;  Laterality: Left;   LOWER EXTREMITY ANGIOGRAPHY Right 12/18/2018   Procedure: LOWER EXTREMITY ANGIOGRAPHY;  Surgeon: Annice Needy, MD;  Location: [facility];  Service: Cardiovascular;  Laterality: Right;   LOWER EXTREMITY INTERVENTION  09/01/2016   Procedure: Lower Extremity Intervention;  Surgeon: Annice Needy, MD;  Location: [facility];  Service: Cardiovascular;;   PROSTATE SURGERY     removal   TONSILLECTOMY     as a child    Medications Prior to Admission  Medication Sig Dispense Refill Last Dose   aspirin 81 MG EC tablet Take 81 mg by mouth daily.   02/17/2023 at 0617   atorvastatin (LIPITOR) 80 MG tablet Take 80 mg by mouth at bedtime.   02/16/2023 at 1823   bisacodyl (DULCOLAX) 5 MG EC tablet Take 5 mg by mouth daily as needed for moderate constipation.   prn at unk   divalproex (DEPAKOTE) 500 MG DR tablet Take 500 mg by  mouth daily.    02/17/2023 at 0617   ferrous sulfate 325 (65 FE) MG tablet Take 325 mg by mouth daily with breakfast.   02/16/2023 at 0917   FLUoxetine HCl 60 MG TABS Take 1 tablet by mouth daily.   02/17/2023 at 0617   furosemide (LASIX) 40 MG tablet Take 1 tablet (40 mg total) by mouth daily. 30 tablet 1 02/17/2023 at 0617   isosorbide mononitrate (IMDUR) 30 MG 24 hr tablet Take 60 mg by mouth daily.   02/17/2023 at 0617   LANTUS 100 UNIT/ML injection Inject 45 Units into the skin at bedtime.   02/16/2023 at 1822   memantine (NAMENDA) 10 MG tablet Take 10 mg by mouth 2 (two) times daily.   02/17/2023 at 0617   metoprolol tartrate (LOPRESSOR) 25 MG tablet Take 25 mg by mouth 2 (two) times daily.   02/17/2023 at 0617   Multiple Vitamin (MULTIVITAMIN) tablet Take 1 tablet by mouth daily.   02/17/2023 at 0617   nitroGLYCERIN (NITROSTAT) 0.4 MG SL tablet Place 1 tablet (0.4 mg total) under the tongue every 5 (five) minutes as needed for chest pain. 20 tablet 12 prn at unk   NOVOLOG FLEXPEN 100 UNIT/ML FlexPen  Inject 6 Units into the skin 3 (three) times daily with meals. (Patient taking differently: Inject 6-8 Units into the skin 3 (three) times daily with meals. 6 units at breakfast and dinner and 8 units at lunch) 15 mL 0 02/16/2023 at 1622   Rivaroxaban (XARELTO) 15 MG TABS tablet Take 1 tablet (15 mg total) by mouth daily with supper. Hold Xarelto for 3 days.  Start on January 09, 2019. 30 tablet 1 02/16/2023 at 1833   trimethoprim (TRIMPEX) 100 MG tablet Take 100 mg by mouth daily.   02/17/2023 at 0617   Vibegron (GEMTESA) 75 MG TABS Take 1 tablet (75 mg total) by mouth daily. 30 tablet 11 02/17/2023 at 0617   Ensure Max Protein (ENSURE MAX PROTEIN) LIQD Take 330 mLs (11 oz total) by mouth 2 (two) times daily. (Patient taking differently: Take 11 oz by mouth daily.)      FLUoxetine (PROZAC) 40 MG capsule Take 40 mg by mouth daily. (Patient not taking: Reported on 02/17/2023)   Not Taking   Insulin Glargine  (BASAGLAR KWIKPEN) 100 UNIT/ML Inject 25 Units into the skin daily. (Resume usual home dose if sugars are elevated) (Patient not taking: Reported on 02/17/2023)   Not Taking   polycarbophil (FIBERCON) 625 MG tablet Take 625 mg by mouth daily. (Patient not taking: Reported on 02/17/2023)   Not Taking   Social History   Socioeconomic History   Marital status: Married    Spouse name: Not on file   Number of children: Not on file   Years of education: Not on file   Highest education level: Not on file  Occupational History   Occupation: retired  Tobacco Use   Smoking status: Former    Current packs/day: 0.00    Types: Cigarettes    Quit date: 10/14/1994    Years since quitting: 28.3    Passive exposure: Past   Smokeless tobacco: Never  Vaping Use   Vaping status: Never Used  Substance and Sexual Activity   Alcohol use: No    Alcohol/week: 0.0 standard drinks of alcohol   Drug use: No   Sexual activity: Not on file  Other Topics Concern   Not on file  Social History Narrative   Lives at home with wife, has a wheelchair   Social Determinants of Health   Financial Resource Strain: Low Risk  (01/31/2018)   Received from [facility]   Overall Financial Resource Strain (CARDIA)    Difficulty of Paying Living Expenses: Not very hard  Food Insecurity: No Food Insecurity (08/15/2022)   Hunger Vital Sign    Worried About Running Out of Food in the Last Year: Never true    Ran Out of Food in the Last Year: Never true  Transportation Needs: No Transportation Needs (08/15/2022)   PRAPARE - Administrator, Civil Service (Medical): No    Lack of Transportation (Non-Medical): No  Physical Activity: Not on file  Stress: Not on file  Social Connections: Not on file  Intimate Partner Violence: Unknown (08/15/2022)   Humiliation, Afraid, Rape, and Kick questionnaire    Fear of Current or Ex-Partner: No    Emotionally Abused: No     Physically Abused: Not on file    Sexually Abused: No    Family History  Problem Relation Age of Onset   Hypertension Mother    Diabetes Mother    Prostate cancer Neg Hx    Bladder Cancer Neg Hx  Kidney disease Neg Hx      Vitals:   02/22/23 0730 02/22/23 0731 02/22/23 1220 02/22/23 1516  BP: (!) 158/81  127/71 125/68  Pulse: (!) 34 (!) 54 60 (!) 57  Resp:      Temp: 97.8 F (36.6 C)  97.8 F (36.6 C) 97.6 F (36.4 C)  TempSrc:      SpO2: (!) 84% (!) 89% 98% 97%  Weight:      Height:        PHYSICAL EXAM General: Chronically ill appearing, well nourished, in no acute distress sitting upright in bedside chair. HEENT: Normocephalic and atraumatic. Neck: No JVD.  Lungs: Normal respiratory effort on room air. Clear bilaterally to auscultation. No wheezes, crackles, rhonchi.  Heart: HRRR. Normal S1 and S2 without gallops or murmurs.  Abdomen: Non-distended appearing.  Msk: Normal strength and tone for age. Extremities: Warm and well perfused. No clubbing, cyanosis. S/p bilat BKA.  Neuro: Alert and oriented X 3. Psych: Answers questions appropriately.   Labs: Basic Metabolic Panel: Recent Labs    02/20/23 0407 02/20/23 1921 02/21/23 0307 02/22/23 0609  NA 140   < > 141 138  K 3.7   < > 5.0 4.3  CL 102   < > 105 106  CO2 25   < > 22 22  GLUCOSE 165*   < > 189* 158*  BUN 43*   < > 54* 44*  CREATININE 2.01*   < > 2.06* 1.59*  CALCIUM 8.6*   < > 8.7* 8.6*  MG 2.1  --  2.2 2.4  PHOS 4.5  --   --   --    < > = values in this interval not displayed.   Liver Function Tests: No results for input(s): "AST", "ALT", "ALKPHOS", "BILITOT", "PROT", "ALBUMIN" in the last 72 hours.  No results for input(s): "LIPASE", "AMYLASE" in the last 72 hours. CBC: Recent Labs    02/21/23 0307 02/22/23 0609  WBC 10.3 9.7  HGB 13.7 13.8  HCT 39.9 41.7  MCV 88.3 88.5  PLT 253 265   Cardiac Enzymes: No results for input(s): "CKTOTAL", "CKMB", "CKMBINDEX", "TROPONINIHS" in the  last 72 hours.  BNP: Recent Labs    02/20/23 0407 02/21/23 0307  BNP 1,607.8* 1,656.9*   D-Dimer: No results for input(s): "DDIMER" in the last 72 hours. Hemoglobin A1C: No results for input(s): "HGBA1C" in the last 72 hours. Fasting Lipid Panel: No results for input(s): "CHOL", "HDL", "LDLCALC", "TRIG", "CHOLHDL", "LDLDIRECT" in the last 72 hours. Thyroid Function Tests: No results for input(s): "TSH", "T4TOTAL", "T3FREE", "THYROIDAB" in the last 72 hours.  Invalid input(s): "FREET3" Anemia Panel: No results for input(s): "VITAMINB12", "FOLATE", "FERRITIN", "TIBC", "IRON", "RETICCTPCT" in the last 72 hours.   Radiology: [facility] Chest Port 1 View  Result Date: 02/17/2023 CLINICAL DATA:  Right chest pain and shortness of breath EXAM: PORTABLE CHEST 1 VIEW COMPARISON:  Chest radiograph 08/14/2022 FINDINGS: The heart is enlarged.  The upper mediastinal contours are normal. There is vascular congestion with possible mild pulmonary interstitial edema. There is no focal airspace consolidation. There is no pleural effusion or pneumothorax There is no acute osseous abnormality. IMPRESSION: Cardiomegaly with vascular congestion and possible mild pulmonary interstitial edema. Electronically Signed   By: Lesia Hausen M.D.   On: 02/17/2023 09:33    ECHO 07/2022:  1. Left ventricular ejection fraction, by estimation, is 35-40%. The left ventricle has moderately decreased function. The LV demonstrates regional wall motion abnormalities (see scoring diagram/findings for description).  Left ventricular  diastolic parameters were normal.   2. Right ventricular systolic function is normal. The right ventricular  size is normal.   3. Left atrial size was mild to moderately dilated.   4. The mitral valve is normal in structure. Mild to moderate mitral valve  regurgitation. No evidence of mitral stenosis.   5. The aortic valve is normal in structure. Aortic valve regurgitation is  not visualized. No aortic  stenosis is present.   6. The inferior vena cava is normal in size with greater than 50%  respiratory variability, suggesting right atrial pressure of 3 mmHg.   TELEMETRY reviewed by me 02/22/2023: sinus rhythm PACs rate 50-60s  EKG reviewed by me: sinus rhythm PACs, QTc prolonged at 592 msec  Data reviewed by me 02/22/2023: last 24h vitals tele labs imaging I/O hospitalist progress note  Principal Problem:   CHF exacerbation (HCC) Active Problems:   NSTEMI (non-ST elevated myocardial infarction) (HCC)   Acute respiratory failure with hypoxia (HCC)   PAF (paroxysmal atrial fibrillation) (HCC)   Chronic kidney disease, stage 3a (HCC)   Acute on chronic systolic CHF (congestive heart failure) (HCC)   Type II diabetes mellitus with renal manifestations (HCC)    ASSESSMENT AND PLAN:  [name] is a 88 y.o. male  with a past medical history of paroxysmal AF (Xarelto), chronic HFrEF (35-40%, rWMAs 07/2022), 1vCAD (CTO ostial LAD, 06/2019), CKD III, DM2, hx bilat BKA who presented to the ED on 02/17/2023 for chest pain and shortness of breath. Cardiology was consulted for further evaluation.   # Acute on chronic HFrEF (EF 35-40% in 07/2022) # Acute hypoxic respiratory failure # Hypertension Patient presenting with episode of chest pain and shortness of breath which resolved with SL nitroglycerin. Noted to be hypoxic in the 80s and placed on supplemental O2. BNP elevated to 1600 on admission. Troponins elevated but flat. Symptoms resolved and patient now weaned to room air. -S/p IV lasix. Continue bumex 2 mg daily.  -metoprolol succinate 75 mg bid. Further additions of GDMT limited by renal function.   # Paroxysmal atrial fibrillation # QT prolongation Patient in AF on presentation with rates in the 110s. Converted to NSR yesterday now sustaining in rhythm with rates in the 50-60s. EKG this afternoon with QTc 592 msec -Discontinue sotalol given QT prolongation.  -Will increase  metoprolol to 75 mg twice daily for rate control.  -Continue home xarelto 15 mg daily (reduced dose appropriate given renal function).  # Demand ischemia # Coronary artery disease  Patient with hx of CAD medically managed with troponin elevation 111 > 116 on presentation in the setting of acute heart failure and acute respiratory failure.  -Troponin elevation most consistent with demand/supply mismatch and not ACS. -Continue home asa, atorvastatin.  -Continue imdur 30 mg daily.   # Chronic kidney disease stage IIIa Baseline between (1.5-2.1). Cr. 1.59 this AM. -Continue to monitor renal function with diuresis.  -Monitor and replenish electrolytes for a goal K >4, Mag >2    This patient's plan of care was discussed and created with Dr. Corky Sing and he is in agreement.  Signed: Gale Journey, PA-C  02/22/2023, 4:21 PM [facility] Cardiology

## 2023-02-22 NOTE — Progress Notes (Signed)
[facility] Progress Note  Patient: [name]    VHQ:[id number]  DOA: 02/17/2023     Date of Service: the patient was seen and examined on 02/22/2023  Chief Complaint  Patient presents with   Chest Pain    Resident of [facility], was sent here this morning for chest pain and shortness of breath that began when he woke up; Was given NTG SL x 1 with relief; Upon arrival, patient denies chest pain and states that his shortness of breath has improved; Does not wear O2 at home, 87% on RA upon arrival   Brief hospital course: Danyal Whitenack is a 88 y.o. male with medical history significant of HFrEF, atrial fibrillation, stage III CKD, hypertension, hyperlipidemia, type 2 diabetes presenting with acute respiratory failure with hypoxia, acute on chronic HFrEF.  Limited history in the setting of age and dementia.  History primarily from patient's son [name] ([id number]) who is also HCPOA.  Per report, patient with increased work of breathing cough over 1 to 2 days.  No reported fevers or chills.  No reported nausea or vomiting.  Per report, has been compliant with diuretic regimen at facility.  No reported chest pain.  No focal hemiparesis reported. Presented to the ER afebrile, hemodynamically stable.  Satting in the mid 80s on room air, transition to 2 L nasal cannula to keep O2 sats greater than 94%.  White count 9.5, hemoglobin 12.6, platelets 247, troponin 1 11-1 16, COVID flu and RSV negative, creatinine 1.9.  BNP 1600.  Chest x-ray with cardiomegaly and interstitial pulmonary edema.   Assessment and Plan:  PAF (paroxysmal atrial fibrillation) Baseline atrial fibrillation EKG atrial flutter with rate in the 100s Titrate rate controlling regimen Continue Xarelto,  10/1 increased metoprolol 75 mg p.o. twice daily S/p sotalol 80 mg p.o. daily by cardiology on 9/29, discontinued on 10/1 9/30 patient converted into sinus rhythm, cardiology recommended to continue sotalol  and monitor on telemetry, possible discharge on 10/2 Follow-up cardiology recommendations 10/1 sotalol was discontinued by cardiology due to QT prolongation Follow EKG tomorrow a.m. and follow cardiology for further recommendation   Acute on chronic systolic CHF (congestive heart failure)  2D echo March 2024 with EF of 35-40% as well as moderate mitral regurgitation Positive acute volume overload with noted BNP of 1600 as well as pulmonary edema chest x-ray S/p Lasix IV 40 mg x doses, s/p Lasix 40 mg IV bid on 9/28 Continue to follow volume status with diuresis Continue GDMT Cardiology consulted, Follow recommendations BNP 1615--1437--1656  Started Bumex 2 mg p.o. daily, Toprol succinate 50 mg p.o. twice daily  Acute respiratory failure with hypoxia. Resolved  Decompensated respiratory status now requiring 2 L nasal cannula in the setting of acute on chronic HFrEF, BNP 1600 with noted pulmonary edema on chest x-ray No overt infection noted. S/p IV Lasix as above On 9/27, started Brovana and budesonide nebulizer twice daily.  Respiratory failure resolved, currently patient is saturating well on room air. Discontinued Brovana and budesonide, transition to [employer] inhaler on 9/29, continue DuoNeb as needed   Chronic kidney disease, stage 3a (HCC) Baseline creatinine 1.5-2.1 with GFR in the 30s to 40s Creatinine 1.86 today with GFR in the 30s Monitor renal function with diuresis 9/29, creatinine 2.01 slightly elevated today, discontinued Lasix   Type II diabetes mellitus with renal manifestations (HCC) SSI A1c     NSTEMI (non-ST elevated myocardial infarction) (HCC) Troponin 100s without active chest pain in setting of acute on chronic  HFrEF EKG stable, Likely secondary demand ischemia Continue home regimen including baby aspirin Follow-up cardiology recommendations  Dementia and depression, continued Namenda, Prozac and Depakote home dose Continue supportive care  UTI  prophylaxis, patient was on trimethoprim, medication which has been discontinued due to elevated BUN and creatinine. Resume on discharge.   Body mass index is 47.48 kg/m.  Interventions:  Diet: Heart healthy carb modified diet DVT Prophylaxis: Therapeutic Anticoagulation with Xarelto    Advance goals of care discussion: DNR/DNI  Family Communication: family was not present at bedside, at the time of interview.  The pt provided permission to discuss medical plan with the family. Opportunity was given to ask question and all questions were answered satisfactorily.   Disposition:  Pt is Resident of [facility] , admitted with CHF, still has SOB, which precludes a safe discharge. Discharge back to [facility], when stable and cleared by cardiology.  Subjective: No significant events overnight, patient was awake and alert, sitting in the recliner, denied any complaints.  No chest pain or palpitations.   Physical Exam: General: NAD, lying comfortably Appear in no distress, affect appropriate Eyes: PERRLA ENT: Oral Mucosa Clear, moist  Neck: no JVD,  Cardiovascular: Irregular rhythm, no Murmur,  Respiratory: Equal air entry bilaterally, no crackles or wheezing appreciated Abdomen: Bowel Sound present, Soft and no tenderness,  Skin: no rashes Extremities: B/L BKA, no edema of stump/thighs Neurologic: without any new focal findings Gait not checked due to patient safety concerns  Vitals:   02/22/23 0730 02/22/23 0731 02/22/23 1220 02/22/23 1516  BP: (!) 158/81  127/71 125/68  Pulse: (!) 34 (!) 54 60 (!) 57  Resp:      Temp: 97.8 F (36.6 C)  97.8 F (36.6 C) 97.6 F (36.4 C)  TempSrc:      SpO2: (!) 84% (!) 89% 98% 97%  Weight:      Height:        Intake/Output Summary (Last 24 hours) at 02/22/2023 1732 Last data filed at 02/22/2023 1200 Gross per 24 hour  Intake --  Output 1225 ml  Net -1225 ml   Filed Weights   02/17/23 0811 02/21/23 0740  Weight: 97.2 kg 86  kg    Data Reviewed: I have personally reviewed and interpreted daily labs, tele strips, imagings as discussed above. I reviewed all nursing notes, pharmacy notes, vitals, pertinent old records I have discussed plan of care as described above with RN and patient/family.  CBC: Recent Labs  Lab 02/17/23 0813 02/18/23 0306 02/19/23 0745 02/20/23 0407 02/21/23 0307 02/22/23 0609  WBC 9.5 8.1 9.6 10.6* 10.3 9.7  NEUTROABS 6.8  --   --   --   --   --   HGB 12.6* 12.0* 13.0 13.7 13.7 13.8  HCT 38.6* 36.0* 39.4 40.9 39.9 41.7  MCV 91.3 90.5 89.3 89.7 88.3 88.5  PLT 247 244 254 278 253 265   Basic Metabolic Panel: Recent Labs  Lab 02/18/23 0306 02/19/23 0745 02/20/23 0407 02/20/23 1921 02/21/23 0307 02/22/23 0609  NA 139 137 140 139 141 138  K 3.9 4.0 3.7 4.5 5.0 4.3  CL 104 103 102 103 105 106  CO2 26 23 25 26 22 22   GLUCOSE 182* 162* 165* 221* 189* 158*  BUN 39* 42* 43* 50* 54* 44*  CREATININE 1.80* 1.86* 2.01* 2.02* 2.06* 1.59*  CALCIUM 8.3* 8.3* 8.6* 8.5* 8.7* 8.6*  MG 2.0  --  2.1  --  2.2 2.4  PHOS 3.8  --  4.5  --   --   --     Studies: No results found.  Scheduled Meds:  aspirin EC  81 mg Oral Daily   atorvastatin  80 mg Oral QHS   bumetanide  2 mg Oral Daily   divalproex  500 mg Oral Daily   FLUoxetine  60 mg Oral Daily   fluticasone furoate-vilanterol  1 puff Inhalation Daily   insulin aspart  0-5 Units Subcutaneous QHS   insulin aspart  0-9 Units Subcutaneous TID WC   insulin aspart  3 Units Subcutaneous TID WC   insulin glargine-yfgn  10 Units Subcutaneous QHS   isosorbide mononitrate  60 mg Oral Daily   melatonin  5 mg Oral QHS   memantine  10 mg Oral BID   metoprolol succinate  75 mg Oral BID   mirabegron ER  75 mg Oral Daily   rivaroxaban  15 mg Oral Q supper   sodium chloride flush  3 mL Intravenous Q12H   sodium chloride flush  3 mL Intravenous Q12H   Continuous Infusions:  sodium chloride     sodium chloride     PRN Meds: sodium  chloride, sodium chloride, hydrALAZINE **OR** hydrALAZINE, ipratropium-albuterol, ondansetron **OR** ondansetron (ZOFRAN) IV, sodium chloride flush, sodium chloride flush  Time spent: 35 minutes  Author: Gillis Santa. MD [facility] 02/22/2023 5:32 PM  To reach On-call, see care teams to locate the attending and reach out to them via [email or web address]. If 7PM-7AM, please contact night-coverage If you still have difficulty reaching the attending provider, please page the [facility] (Director on Call) for [facility] on amion for assistance.

## 2023-02-22 NOTE — Inpatient Diabetes Management (Signed)
Inpatient Diabetes Program Recommendations  AACE/ADA: New Consensus Statement on Inpatient Glycemic Control (2015)  Target Ranges:  Prepandial:   less than 140 mg/dL      Peak postprandial:   less than 180 mg/dL (1-2 hours)      Critically ill patients:  140 - 180 mg/dL   Lab Results  Component Value Date   GLUCAP 155 (H) 02/22/2023   HGBA1C 8.6 (H) 02/18/2023    Latest Reference Range & Units 02/21/23 07:33 02/21/23 11:41 02/21/23 16:39 02/21/23 17:03 02/21/23 21:46 02/22/23 08:34  Glucose-Capillary 70 - 99 mg/dL 440 (H) Novlog 6 units 225 (H) Novolog 9 units 60 (L) 80 126 (H) 155 (H) Novolog 7 units  (H): Data is abnormally high (L): Data is abnormally low  Diabetes history: DM 2  Outpatient Diabetes medications: Lantus 45 units qhs, Novolog 6 units breakfast, 8 units lunch, 6 units supper Current orders for Inpatient glycemic control:  Novolog 0-15 units tid + hs Novolog 4 units tid meal coverage  Inpatient Diabetes Program Recommendations:   Patient has received total of Novolog 22 units over the past 24 hrs. Noted hypoglycemia post Novolog correction + meal coverage Please consider: -Add Semglee 10 units daily (0.1 unit/kg x 86 kg = 8.6 units) -Decrease Novolog correction to 0-9 units tid, 0-5 units hs -Decrease Novolog meal coverage to 3 units tid if eats 50% meals  Thank you, [name]. Jesse Dick, RN, MSN, CDE  Diabetes Coordinator Inpatient Glycemic Control Team Team Pager [phone] (8am-5pm) 02/22/2023 12:20 PM

## 2023-02-23 DIAGNOSIS — Z794 Long term (current) use of insulin: Secondary | ICD-10-CM

## 2023-02-23 DIAGNOSIS — I4891 Unspecified atrial fibrillation: Secondary | ICD-10-CM

## 2023-02-23 DIAGNOSIS — I480 Paroxysmal atrial fibrillation: Secondary | ICD-10-CM

## 2023-02-23 DIAGNOSIS — I5023 Acute on chronic systolic (congestive) heart failure: Secondary | ICD-10-CM | POA: Diagnosis not present

## 2023-02-23 DIAGNOSIS — N1831 Chronic kidney disease, stage 3a: Secondary | ICD-10-CM | POA: Diagnosis not present

## 2023-02-23 DIAGNOSIS — N1832 Chronic kidney disease, stage 3b: Secondary | ICD-10-CM

## 2023-02-23 DIAGNOSIS — J9601 Acute respiratory failure with hypoxia: Secondary | ICD-10-CM | POA: Diagnosis not present

## 2023-02-23 DIAGNOSIS — I21A1 Myocardial infarction type 2: Secondary | ICD-10-CM

## 2023-02-23 DIAGNOSIS — I4581 Long QT syndrome: Secondary | ICD-10-CM

## 2023-02-23 DIAGNOSIS — E1122 Type 2 diabetes mellitus with diabetic chronic kidney disease: Secondary | ICD-10-CM

## 2023-02-23 LAB — BASIC METABOLIC PANEL
Anion gap: 9 (ref 5–15)
BUN: 39 mg/dL — ABNORMAL HIGH (ref 8–23)
CO2: 29 mmol/L (ref 22–32)
Calcium: 8.7 mg/dL — ABNORMAL LOW (ref 8.9–10.3)
Chloride: 101 mmol/L (ref 98–111)
Creatinine, Ser: 1.64 mg/dL — ABNORMAL HIGH (ref 0.61–1.24)
GFR, Estimated: 41 mL/min — ABNORMAL LOW (ref >60)
Glucose, Bld: 121 mg/dL — ABNORMAL HIGH (ref 70–99)
Potassium: 3.8 mmol/L (ref 3.5–5.1)
Sodium: 139 mmol/L (ref 135–145)

## 2023-02-23 LAB — CBC
HCT: 41 % (ref 39.0–52.0)
Hemoglobin: 14 g/dL (ref 13.0–17.0)
MCH: 29.7 pg (ref 26.0–34.0)
MCHC: 34.1 g/dL (ref 30.0–36.0)
MCV: 86.9 fL (ref 80.0–100.0)
Platelets: 244 10*3/uL (ref 150–400)
RBC: 4.72 MIL/uL (ref 4.22–5.81)
RDW: 14.6 % (ref 11.5–15.5)
WBC: 8.4 10*3/uL (ref 4.0–10.5)
nRBC: 0 % (ref 0.0–0.2)

## 2023-02-23 LAB — GLUCOSE, CAPILLARY
Glucose-Capillary: 109 mg/dL — ABNORMAL HIGH (ref 70–99)
Glucose-Capillary: 99 mg/dL (ref 70–99)
Glucose-Capillary: 177 mg/dL — ABNORMAL HIGH (ref 70–99)
Glucose-Capillary: 90 mg/dL (ref 70–99)
Glucose-Capillary: 128 mg/dL — ABNORMAL HIGH (ref 70–99)

## 2023-02-23 LAB — MAGNESIUM: Magnesium: 2.3 mg/dL (ref 1.7–2.4)

## 2023-02-23 NOTE — [place] FL2 (Addendum)
[place] MEDICAID FL2 LEVEL OF CARE FORM     IDENTIFICATION  Patient Name: [name] Birthdate: July 29, 1934 Sex: male Admission Date (Current Location): 02/17/2023  [place] and [place] Number:  Chiropodist and Address:  [facility], [address] [place], [place] [address]      Provider Number: [id number]  Attending Physician Name and Address:  Marcelino Duster, MD  Relative Name and Phone Number:       Current Level of Care: Hospital Recommended Level of Care: Skilled Nursing Facility Prior Approval Number:    Date Approved/Denied:   PASRR Number: [id number]  Discharge Plan: SNF    Current Diagnoses: Patient Active Problem List   Diagnosis Date Noted   CHF exacerbation (HCC) 02/17/2023   Acute on chronic systolic CHF (congestive heart failure) (HCC) 08/14/2022   HTN (hypertension) 08/14/2022   Depression 08/14/2022   Type II diabetes mellitus with renal manifestations (HCC) 08/14/2022   Frequent UTI 04/21/2021   Mood disorder (HCC) 04/21/2021   Chronic systolic CHF (congestive heart failure) (HCC) 03/06/2021   Hyperglycemia due to type 2 diabetes mellitus (HCC) 11/10/2020   GERD (gastroesophageal reflux disease) 11/10/2020   Chest pain 11/09/2020   Elevated troponin I level 09/19/2020   Acute encephalopathy 07/29/2020   Lactic acidosis 07/28/2020   Hypoxic 07/28/2020   Chronic prescription opiate use 07/28/2020   Elevated troponin 07/28/2020   Chronic anticoagulation 06/25/2020   Chronic kidney disease, stage 3a (HCC) 06/25/2020   AMS (altered mental status) 04/08/2020   CAD (coronary artery disease) 03/06/2020   Personal history of COVID-19 03/06/2020   Acute toxic-metabolic encephalopathy 03/06/2020   UTI (urinary tract infection) 03/06/2020   (HFpEF) heart failure with preserved ejection fraction (HCC) 03/06/2020   Goals of care, counseling/discussion    Palliative care by specialist    Seizure  disorder (HCC) 11/30/2019   ACS (acute coronary syndrome) (HCC) 06/24/2019   Sinus bradycardia on ECG 05/16/2019   Bradycardia 05/16/2019   PAF (paroxysmal atrial fibrillation) (HCC) 03/27/2019   Hx of BKA, right (HCC) 03/02/2019   Severe protein-calorie malnutrition (HCC) 02/16/2019   Acute systolic CHF (congestive heart failure) (HCC) 02/16/2019   Acute respiratory failure with hypoxia (HCC) 02/08/2019   Pneumonia due to COVID-19 virus 02/08/2019   Below-knee amputation of right lower extremity (HCC) 01/15/2019   Polyp of descending colon    Iron deficiency anemia due to chronic blood loss    Sacral decubitus ulcer, stage II (HCC) 12/26/2018   Acute respiratory failure (HCC) 12/20/2018   Femur fracture (HCC) 10/13/2018   Nonspecific chest pain 06/03/2018   Chest pain, unspecified 06/03/2018   History of arterial ischemic stroke 03/21/2018   Hypothyroidism, acquired, autoimmune 02/21/2018   Hyperlipidemia associated with type 2 diabetes mellitus (HCC) 02/21/2018   NSTEMI (non-ST elevated myocardial infarction) (HCC) 01/16/2018   Acute CHF (congestive heart failure) (HCC) 11/23/2017   Atrial fibrillation with rapid ventricular response (HCC) 10/15/2017   Seizures (HCC) 08/18/2017   BKA stump complication (HCC) 02/10/2017   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 (HCC) 02/10/2017   S/P bilateral BKA (below knee amputation) (HCC) 02/01/2017   Chronic diastolic heart failure (HCC) 01/21/2017   Pressure injury of skin 01/18/2017   Atherosclerosis of artery of extremity with gangrene (HCC) 01/05/2017   Diabetic foot ulcer (HCC) 12/29/2016   Ataxia 10/21/2016   Atherosclerosis of native arteries of the extremities with ulceration (HCC) 10/12/2016   History of femoral angiogram 09/06/2016   Left leg pain 09/06/2016  Generalized weakness 09/06/2016   AKI (acute kidney injury) (HCC) 09/06/2016   Atrial fibrillation, chronic (HCC) 08/30/2016   Pneumonia 05/19/2016    Hyperlipidemia 04/20/2016   Back pain 04/19/2016   DM (diabetes mellitus), type 2 with complications (HCC) 04/19/2016   Primary hypertension 04/19/2016   PAD (peripheral artery disease) (HCC) 04/19/2016   Erectile dysfunction following radical prostatectomy 06/25/2015   History of prostate cancer 12/23/2014   Incontinence 12/23/2014    Orientation RESPIRATION BLADDER Height & Weight     Self  Normal Incontinent, External catheter Weight: 189 lb 11.2 oz (86 kg) Height:  4\' 5"  (134.6 cm)  BEHAVIORAL SYMPTOMS/MOOD NEUROLOGICAL BOWEL NUTRITION STATUS   (None)  (Seizure disorder) Incontinent Diet (Heart healthy/carb modified. Fluid restriction: 1500 mL.)  AMBULATORY STATUS COMMUNICATION OF NEEDS Skin   Extensive Assist Verbally Normal                       Personal Care Assistance Level of Assistance  Bathing, Feeding, Dressing Bathing Assistance: Limited assistance Feeding assistance: Limited assistance Dressing Assistance: Limited assistance     Functional Limitations Info  Sight, Hearing, Speech Sight Info: Adequate Hearing Info: Adequate Speech Info: Adequate    SPECIAL CARE FACTORS FREQUENCY  PT (By licensed PT), OT (By licensed OT)     PT Frequency: 5 x week OT Frequency: 5 x week            Contractures Contractures Info: Not present    Additional Factors Info  Code Status, Allergies Code Status Info: Full code Allergies Info: Iodinated Contrast Media, Iohexol, Metrizamide, Latex           Current Medications (02/23/2023):  This is the current hospital active medication list Current Facility-Administered Medications  Medication Dose Route Frequency Provider Last Rate Last Admin   0.9 %  sodium chloride infusion  250 mL Intravenous PRN Floydene Flock, MD       0.9 %  sodium chloride infusion  250 mL Intravenous PRN Callwood, Dwayne D, MD       aspirin EC tablet 81 mg  81 mg Oral Daily Hudson, Caralyn, PA-C   81 mg at 02/23/23 0845   atorvastatin  (LIPITOR) tablet 80 mg  80 mg Oral QHS Hudson, Caralyn, PA-C   80 mg at 02/22/23 2206   bumetanide (BUMEX) tablet 2 mg  2 mg Oral Daily Hudson, Caralyn, PA-C   2 mg at 02/23/23 0849   divalproex (DEPAKOTE) DR tablet 500 mg  500 mg Oral Daily Gillis Santa, MD   500 mg at 02/23/23 0849   FLUoxetine (PROZAC) capsule 60 mg  60 mg Oral Daily Gillis Santa, MD   60 mg at 02/23/23 0845   fluticasone furoate-vilanterol (BREO ELLIPTA) 200-25 MCG/ACT 1 puff  1 puff Inhalation Daily Gillis Santa, MD   1 puff at 02/23/23 0850   hydrALAZINE (APRESOLINE) tablet 50 mg  50 mg Oral Q6H PRN Gillis Santa, MD       Or   hydrALAZINE (APRESOLINE) injection 10 mg  10 mg Intravenous Q6H PRN Gillis Santa, MD       insulin aspart (novoLOG) injection 0-5 Units  0-5 Units Subcutaneous QHS Gillis Santa, MD       insulin aspart (novoLOG) injection 0-9 Units  0-9 Units Subcutaneous TID WC Gillis Santa, MD   2 Units at 02/22/23 1804   insulin aspart (novoLOG) injection 3 Units  3 Units Subcutaneous TID WC Gillis Santa, MD   3 Units at 02/23/23 [phone]  insulin glargine-yfgn (SEMGLEE) injection 10 Units  10 Units Subcutaneous QHS Gillis Santa, MD   10 Units at 02/22/23 2207   ipratropium-albuterol (DUONEB) 0.5-2.5 (3) MG/3ML nebulizer solution 3 mL  3 mL Nebulization Q6H PRN Gillis Santa, MD       isosorbide mononitrate (IMDUR) 24 hr tablet 60 mg  60 mg Oral Daily Hudson, Caralyn, PA-C   60 mg at 02/23/23 0845   melatonin tablet 5 mg  5 mg Oral QHS Andris Baumann, MD   5 mg at 02/22/23 2207   memantine (NAMENDA) tablet 10 mg  10 mg Oral BID Gillis Santa, MD   10 mg at 02/23/23 0845   metoprolol succinate (TOPROL-XL) 24 hr tablet 75 mg  75 mg Oral BID Hudson, Caralyn, PA-C   75 mg at 02/23/23 0845   mirabegron ER (MYRBETRIQ) tablet 75 mg  75 mg Oral Daily Gillis Santa, MD   75 mg at 02/23/23 0849   ondansetron (ZOFRAN) tablet 4 mg  4 mg Oral Q6H PRN Floydene Flock, MD       Or   ondansetron [facility]) injection 4 mg  4  mg Intravenous Q6H PRN Floydene Flock, MD       Rivaroxaban Carlena Hurl) tablet 15 mg  15 mg Oral Q supper Callwood, Dwayne D, MD   15 mg at 02/22/23 1759   sodium chloride flush (NS) 0.9 % injection 3 mL  3 mL Intravenous Q12H Floydene Flock, MD   3 mL at 02/21/23 2122   sodium chloride flush (NS) 0.9 % injection 3 mL  3 mL Intravenous PRN Floydene Flock, MD       sodium chloride flush (NS) 0.9 % injection 3 mL  3 mL Intravenous Q12H Callwood, Dwayne D, MD   3 mL at 02/21/23 2122   sodium chloride flush (NS) 0.9 % injection 3 mL  3 mL Intravenous PRN Callwood, Dwayne D, MD         Discharge Medications: Please see discharge summary for a list of discharge medications.  Relevant Imaging Results:  Relevant Lab Results:   Additional Information SS#: [id number]. Lives at [facility].  Margarito Liner, LCSW

## 2023-02-23 NOTE — Progress Notes (Signed)
[facility] CLINIC CARDIOLOGY CONSULT NOTE       Patient ID: [name] MRN: [id number] DOB/AGE: [birth year] 88 y.o.  Admit date: 02/17/2023 Referring Physician Dr. Doree Albee Primary Physician Laren Everts, NP Primary Cardiologist Marijo Conception, NP Reason for Consultation AoCHF  HPI: [name] is a 88 y.o. male  with a past medical history of paroxysmal AF (Xarelto), chronic HFrEF (35-40%, rWMAs 07/2022), 1vCAD (CTO ostial LAD, 06/2019), CKD III, DM2, hx bilat BKA who presented to the ED on 02/17/2023 for chest pain and shortness of breath. Cardiology was consulted for further evaluation.   Interval history: -Patient examined this AM, resting comfortably in bed.  -EKG this AM demonstrated QTc 550. Patient is without complaints.  -Remains in NSR with PACs on telemetry.  Review of systems complete and found to be negative unless listed above    Past Medical History:  Diagnosis Date   Acute respiratory failure with hypoxia (HCC) 02/08/2019   Arthritis    Atrial fibrillation (HCC)    Carcinoma of prostate (HCC)    CHF (congestive heart failure) (HCC)    Chronic kidney disease    Diabetes mellitus without complication (HCC)    ED (erectile dysfunction)    Frequent urination    Hyperlipidemia    Hypertension    Leukocytosis 09/06/2016   Moderate mitral insufficiency    Myocardial injury 08/14/2022   Peripheral vascular disease (HCC)    Pneumonia 04/2016   Prostate cancer (HCC)    Sleep apnea    OSA--USE C-PAP   Ulcer of left foot due to type 2 diabetes mellitus (HCC)    Urinary stress incontinence, male     Past Surgical History:  Procedure Laterality Date   AMPUTATION Left 01/12/2017   Procedure: AMPUTATION BELOW KNEE;  Surgeon: Annice Needy, MD;  Location: [facility] ORS;  Service: General;  Laterality: Left;   AMPUTATION Right 12/27/2018   Procedure: AMPUTATION BELOW KNEE;  Surgeon: Annice Needy, MD;  Location: [facility] ORS;  Service: General;  Laterality:  Right;   APLIGRAFT PLACEMENT Left 10/15/2016   Procedure: APLIGRAFT PLACEMENT;  Surgeon: Recardo Evangelist, DPM;  Location: [facility] ORS;  Service: Podiatry;  Laterality: Left;  necrotic ulcer   CHOLECYSTECTOMY     COLONOSCOPY WITH PROPOFOL N/A 01/02/2019   Procedure: COLONOSCOPY WITH PROPOFOL;  Surgeon: Midge Minium, MD;  Location: [facility] ENDOSCOPY;  Service: Endoscopy;  Laterality: N/A;   COLONOSCOPY WITH PROPOFOL N/A 01/05/2019   Procedure: COLONOSCOPY WITH PROPOFOL;  Surgeon: Midge Minium, MD;  Location: [facility] ENDOSCOPY;  Service: Endoscopy;  Laterality: N/A;   ESOPHAGOGASTRODUODENOSCOPY (EGD) WITH PROPOFOL N/A 01/02/2019   Procedure: ESOPHAGOGASTRODUODENOSCOPY (EGD) WITH PROPOFOL;  Surgeon: Midge Minium, MD;  Location: [facility] ENDOSCOPY;  Service: Endoscopy;  Laterality: N/A;   EYE SURGERY Bilateral    Cataract Extraction with IOL   HIP ARTHROPLASTY Right 10/13/2018   Procedure: ARTHROPLASTY BIPOLAR HIP (HEMIARTHROPLASTY);  Surgeon: Deeann Saint, MD;  Location: [facility] ORS;  Service: Orthopedics;  Laterality: Right;   IRRIGATION AND DEBRIDEMENT FOOT Left 10/15/2016   Procedure: IRRIGATION AND DEBRIDEMENT FOOT-EXCISIONAL DEBRIDEMENT OF SKIN 3RD, 4TH AND 5TH TOES WITH APPLICATION OF APLIGRAFT;  Surgeon: Recardo Evangelist, DPM;  Location: [facility] ORS;  Service: Podiatry;  Laterality: Left;  necrotic,gangrene   IRRIGATION AND DEBRIDEMENT FOOT Left 12/09/2016   Procedure: IRRIGATION AND DEBRIDEMENT FOOT-MUSCLE/FASCIA, RESECTION OF FOURTH AND FIFTH METATARSAL NECROTIC BONE;  Surgeon: Recardo Evangelist, DPM;  Location: [facility] ORS;  Service: Podiatry;  Laterality: Left;   IRRIGATION AND DEBRIDEMENT FOOT Right 12/23/2018  Procedure: IRRIGATION AND DEBRIDEMENT FOOT and wound vac application;  Surgeon: Gwyneth Revels, DPM;  Location: [facility] ORS;  Service: Podiatry;  Laterality: Right;   LEFT HEART CATH N/A 06/25/2019   Procedure: Left Heart Cath and Coronary Angiography;  Surgeon: Marcina Millard, MD;  Location: [facility] INVASIVE  CV LAB;  Service: Cardiovascular;  Laterality: N/A;   LEFT HEART CATH AND CORONARY ANGIOGRAPHY N/A 06/05/2018   Procedure: LEFT HEART CATH AND CORONARY ANGIOGRAPHY;  Surgeon: Alwyn Pea, MD;  Location: [facility] INVASIVE CV LAB;  Service: Cardiovascular;  Laterality: N/A;   LOWER EXTREMITY ANGIOGRAPHY Left 08/16/2016   Procedure: Lower Extremity Angiography;  Surgeon: Annice Needy, MD;  Location: [facility] INV[facility] CV LAB;  Service: Cardiovascular;  Laterality: Left;   LOWER EXTREMITY ANGIOGRAPHY Left 09/01/2016   Procedure: Lower Extremity Angiography;  Surgeon: Annice Needy, MD;  Location: [facility] CV LAB;  Service: Cardiovascular;  Laterality: Left;   LOWER EXTREMITY ANGIOGRAPHY Left 10/14/2016   Procedure: Lower Extremity Angiography;  Surgeon: Annice Needy, MD;  Location: [facility] INVAS[facility] CV LAB;  Service: Cardiovascular;  Laterality: Left;   LOWER EXTREMITY ANGIOGRAPHY Left 12/20/2016   Procedure: Lower Extremity Angiography;  Surgeon: Annice Needy, MD;  Location: [facility] INV[facility] CV LAB;  Service: Cardiovascular;  Laterality: Left;   LOWER EXTREMITY ANGIOGRAPHY Right 12/18/2018   Procedure: LOWER EXTREMITY ANGIOGRAPHY;  Surgeon: Annice Needy, MD;  Location: [facility] INV[facility] CV LAB;  Service: Cardiovascular;  Laterality: Right;   LOWER EXTREMITY INTERVENTION  09/01/2016   Procedure: Lower Extremity Intervention;  Surgeon: Annice Needy, MD;  Location: [facility] INVASIVE CV LAB;  Service: Cardiovascular;;   PROSTATE SURGERY     removal   TONSILLECTOMY     as a child    Medications Prior to Admission  Medication Sig Dispense Refill Last Dose   aspirin 81 MG EC tablet Take 81 mg by mouth daily.   02/17/2023 at 0617   atorvastatin (LIPITOR) 80 MG tablet Take 80 mg by mouth at bedtime.   02/16/2023 at 1823   bisacodyl (DULCOLAX) 5 MG EC tablet Take 5 mg by mouth daily as needed for moderate constipation.   prn at unk   divalproex (DEPAKOTE) 500 MG DR tablet Take 500 mg by mouth daily.    02/17/2023 at 0617    ferrous sulfate 325 (65 FE) MG tablet Take 325 mg by mouth daily with breakfast.   02/16/2023 at 0917   FLUoxetine HCl 60 MG TABS Take 1 tablet by mouth daily.   02/17/2023 at 0617   furosemide (LASIX) 40 MG tablet Take 1 tablet (40 mg total) by mouth daily. 30 tablet 1 02/17/2023 at 0617   isosorbide mononitrate (IMDUR) 30 MG 24 hr tablet Take 60 mg by mouth daily.   02/17/2023 at 0617   LANTUS 100 UNIT/ML injection Inject 45 Units into the skin at bedtime.   02/16/2023 at 1822   memantine (NAMENDA) 10 MG tablet Take 10 mg by mouth 2 (two) times daily.   02/17/2023 at 0617   metoprolol tartrate (LOPRESSOR) 25 MG tablet Take 25 mg by mouth 2 (two) times daily.   02/17/2023 at 0617   Multiple Vitamin (MULTIVITAMIN) tablet Take 1 tablet by mouth daily.   02/17/2023 at 0617   nitroGLYCERIN (NITROSTAT) 0.4 MG SL tablet Place 1 tablet (0.4 mg total) under the tongue every 5 (five) minutes as needed for chest pain. 20 tablet 12 prn at unk   NOVOLOG FLEXPEN 100 UNIT/ML FlexPen Inject 6 Units into the skin  3 (three) times daily with meals. (Patient taking differently: Inject 6-8 Units into the skin 3 (three) times daily with meals. 6 units at breakfast and dinner and 8 units at lunch) 15 mL 0 02/16/2023 at 1622   Rivaroxaban (XARELTO) 15 MG TABS tablet Take 1 tablet (15 mg total) by mouth daily with supper. Hold Xarelto for 3 days.  Start on January 09, 2019. 30 tablet 1 02/16/2023 at 1833   trimethoprim (TRIMPEX) 100 MG tablet Take 100 mg by mouth daily.   02/17/2023 at 0617   Vibegron (GEMTESA) 75 MG TABS Take 1 tablet (75 mg total) by mouth daily. 30 tablet 11 02/17/2023 at 0617   Ensure Max Protein (ENSURE MAX PROTEIN) LIQD Take 330 mLs (11 oz total) by mouth 2 (two) times daily. (Patient taking differently: Take 11 oz by mouth daily.)      FLUoxetine (PROZAC) 40 MG capsule Take 40 mg by mouth daily. (Patient not taking: Reported on 02/17/2023)   Not Taking   Insulin Glargine (BASAGLAR KWIKPEN) 100 UNIT/ML Inject 25  Units into the skin daily. (Resume usual home dose if sugars are elevated) (Patient not taking: Reported on 02/17/2023)   Not Taking   polycarbophil (FIBERCON) 625 MG tablet Take 625 mg by mouth daily. (Patient not taking: Reported on 02/17/2023)   Not Taking   Social History   Socioeconomic History   Marital status: Married    Spouse name: Not on file   Number of children: Not on file   Years of education: Not on file   Highest education level: Not on file  Occupational History   Occupation: retired  Tobacco Use   Smoking status: Former    Current packs/day: 0.00    Types: Cigarettes    Quit date: 10/14/1994    Years since quitting: 28.3    Passive exposure: Past   Smokeless tobacco: Never  Vaping Use   Vaping status: Never Used  Substance and Sexual Activity   Alcohol use: No    Alcohol/week: 0.0 standard drinks of alcohol   Drug use: No   Sexual activity: Not on file  Other Topics Concern   Not on file  Social History Narrative   Lives at home with wife, has a wheelchair   Social Determinants of Health   Financial Resource Strain: Low Risk  (01/31/2018)   Received from [facility], [facility]   Overall Financial Resource Strain (CARDIA)    Difficulty of Paying Living Expenses: Not very hard  Food Insecurity: No Food Insecurity (08/15/2022)   Hunger Vital Sign    Worried About Running Out of Food in the Last Year: Never true    Ran Out of Food in the Last Year: Never true  Transportation Needs: No Transportation Needs (08/15/2022)   PRAPARE - Administrator, Civil Service (Medical): No    Lack of Transportation (Non-Medical): No  Physical Activity: Not on file  Stress: Not on file  Social Connections: Not on file  Intimate Partner Violence: Unknown (08/15/2022)   Humiliation, Afraid, Rape, and Kick questionnaire    Fear of Current or Ex-Partner: No    Emotionally Abused: No    Physically Abused: Not on file    Sexually  Abused: No    Family History  Problem Relation Age of Onset   Hypertension Mother    Diabetes Mother    Prostate cancer Neg Hx    Bladder Cancer Neg Hx    Kidney disease Neg Hx  Vitals:   02/23/23 0304 02/23/23 0449 02/23/23 0725 02/23/23 0741  BP: (!) 161/67 (!) 143/83  (!) 142/74  Pulse: (!) 53 (!) 59 (!) 52 (!) 40  Resp: 20 20 20 16   Temp: 97.8 F (36.6 C) 97.7 F (36.5 C)  (!) 97.5 F (36.4 C)  TempSrc: Oral Oral  Oral  SpO2: 92% 92% 95% 93%  Weight:      Height:        PHYSICAL EXAM General: Chronically ill appearing, well nourished, in no acute distress resting comfortably in hospital bed. HEENT: Normocephalic and atraumatic. Neck: No JVD.  Lungs: Normal respiratory effort on room air. Clear bilaterally to auscultation. No wheezes, crackles, rhonchi.  Heart: HRRR. Normal S1 and S2 without gallops or murmurs.  Abdomen: Non-distended appearing.  Msk: Normal strength and tone for age. Extremities: Warm and well perfused. No clubbing, cyanosis. S/p bilat BKA.  Neuro: Alert and oriented X 3. Psych: Answers questions appropriately.   Labs: Basic Metabolic Panel: Recent Labs    02/22/23 0609 02/23/23 0426  NA 138 139  K 4.3 3.8  CL 106 101  CO2 22 29  GLUCOSE 158* 121*  BUN 44* 39*  CREATININE 1.59* 1.64*  CALCIUM 8.6* 8.7*  MG 2.4 2.3   Liver Function Tests: No results for input(s): "AST", "ALT", "ALKPHOS", "BILITOT", "PROT", "ALBUMIN" in the last 72 hours.  No results for input(s): "LIPASE", "AMYLASE" in the last 72 hours. CBC: Recent Labs    02/22/23 0609 02/23/23 0426  WBC 9.7 8.4  HGB 13.8 14.0  HCT 41.7 41.0  MCV 88.5 86.9  PLT 265 244   Cardiac Enzymes: No results for input(s): "CKTOTAL", "CKMB", "CKMBINDEX", "TROPONINIHS" in the last 72 hours.  BNP: Recent Labs    02/21/23 0307  BNP 1,656.9*   D-Dimer: No results for input(s): "DDIMER" in the last 72 hours. Hemoglobin A1C: No results for input(s): "HGBA1C" in the last 72  hours. Fasting Lipid Panel: No results for input(s): "CHOL", "HDL", "LDLCALC", "TRIG", "CHOLHDL", "LDLDIRECT" in the last 72 hours. Thyroid Function Tests: No results for input(s): "TSH", "T4TOTAL", "T3FREE", "THYROIDAB" in the last 72 hours.  Invalid input(s): "FREET3" Anemia Panel: No results for input(s): "VITAMINB12", "FOLATE", "FERRITIN", "TIBC", "IRON", "RETICCTPCT" in the last 72 hours.   Radiology: [facility] Chest Port 1 View  Result Date: 02/17/2023 CLINICAL DATA:  Right chest pain and shortness of breath EXAM: PORTABLE CHEST 1 VIEW COMPARISON:  Chest radiograph 08/14/2022 FINDINGS: The heart is enlarged.  The upper mediastinal contours are normal. There is vascular congestion with possible mild pulmonary interstitial edema. There is no focal airspace consolidation. There is no pleural effusion or pneumothorax There is no acute osseous abnormality. IMPRESSION: Cardiomegaly with vascular congestion and possible mild pulmonary interstitial edema. Electronically Signed   By: Lesia Hausen M.D.   On: 02/17/2023 09:33    ECHO 07/2022:  1. Left ventricular ejection fraction, by estimation, is 35-40%. The left ventricle has moderately decreased function. The LV demonstrates regional wall motion abnormalities (see scoring diagram/findings for description). Left ventricular  diastolic parameters were normal.   2. Right ventricular systolic function is normal. The right ventricular  size is normal.   3. Left atrial size was mild to moderately dilated.   4. The mitral valve is normal in structure. Mild to moderate mitral valve  regurgitation. No evidence of mitral stenosis.   5. The aortic valve is normal in structure. Aortic valve regurgitation is  not visualized. No aortic stenosis is present.   6.  The inferior vena cava is normal in size with greater than 50%  respiratory variability, suggesting right atrial pressure of 3 mmHg.   TELEMETRY reviewed by me 02/23/2023: sinus rhythm PACs rate  60-70s  EKG reviewed by me: sinus rhythm PACs, QTc prolonged at 500 msec  Data reviewed by me 02/23/2023: last 24h vitals tele labs imaging I/O hospitalist progress note  Principal Problem:   CHF exacerbation (HCC) Active Problems:   NSTEMI (non-ST elevated myocardial infarction) (HCC)   Acute respiratory failure with hypoxia (HCC)   PAF (paroxysmal atrial fibrillation) (HCC)   Chronic kidney disease, stage 3a (HCC)   Acute on chronic systolic CHF (congestive heart failure) (HCC)   Type II diabetes mellitus with renal manifestations (HCC)    ASSESSMENT AND PLAN:  [name] is a 88 y.o. male  with a past medical history of paroxysmal AF (Xarelto), chronic HFrEF (35-40%, rWMAs 07/2022), 1vCAD (CTO ostial LAD, 06/2019), CKD III, DM2, hx bilat BKA who presented to the ED on 02/17/2023 for chest pain and shortness of breath. Cardiology was consulted for further evaluation.   # Acute on chronic HFrEF (EF 35-40% in 07/2022) # Acute hypoxic respiratory failure # Hypertension Patient presenting with episode of chest pain and shortness of breath which resolved with SL nitroglycerin. Noted to be hypoxic in the 80s and placed on supplemental O2. BNP elevated to 1600 on admission. Troponins elevated but flat. Symptoms resolved and patient now weaned to room air. -S/p IV lasix. Continue bumex 2 mg daily.  -metoprolol succinate 75 mg bid. Further additions of GDMT limited by renal function.   # Paroxysmal atrial fibrillation # QT prolongation Patient in AF on presentation with rates in the 110s. Converted to NSR now sustaining in rhythm. EKG this morning with QTc 550 msec -Sotalol discontinued yesterday. Continue to monitor QTc. Plan for repeat EKG this afternoon. -Continue metoprolol to 75 mg twice daily for rate control.  -Continue home xarelto 15 mg daily (reduced dose appropriate given renal function).  # Demand ischemia # Coronary artery disease  Patient with hx of CAD medically  managed with troponin elevation 111 > 116 on presentation in the setting of acute heart failure and acute respiratory failure.  -Troponin elevation most consistent with demand/supply mismatch and not ACS. -Continue home asa, atorvastatin.  -Continue imdur 30 mg daily.   # Chronic kidney disease stage IIIa Baseline between (1.5-2.1). Cr. 1.64 this AM. -Continue to monitor renal function with diuresis.  -Monitor and replenish electrolytes for a goal K >4, Mag >2    This patient's plan of care was discussed and created with Dr. Corky Sing and he is in agreement.  Signed: Gale Journey, PA-C  02/23/2023, 9:50 AM [facility] Cardiology

## 2023-02-23 NOTE — Progress Notes (Addendum)
EKG completed with critical result of prolonged Qtc. Pt stable on assessment. VSS. MD Para March made aware. Will continue to monitor.  Update 0502: No new order. Will continue to monitor.

## 2023-02-23 NOTE — TOC Progression Note (Signed)
Transition of Care [facility]) - Progression Note    Patient Details  Name: [name] MRN: [id number] Date of Birth: April 02, 1935  Transition of Care [facility]) CM/SW Contact  Darolyn Rua, [place] Phone Number: 02/23/2023, 12:20 PM  Clinical Narrative:     CSW attempted to speak with patient regarding SNF recs for STR prior to returning to [facility] ALF, he appeared groggy and asked CSW to come back.   CSW did speak with son [name] who is in agreement with snf referrals being sent out for bed offers.   Pending bed offers at this time.   Expected Discharge Plan: Assisted Living (with home health therapy) Barriers to Discharge: Continued Medical Work up  Expected Discharge Plan and Services       Living arrangements for the past 2 months: Assisted Living Facility                                       Social Determinants of Health (SDOH) Interventions SDOH Screenings   Food Insecurity: No Food Insecurity (08/15/2022)  Housing: Low Risk  (08/15/2022)  Transportation Needs: No Transportation Needs (08/15/2022)  Utilities: Not At Risk (08/15/2022)  Financial Resource Strain: Low Risk  (01/31/2018)   Received from [facility]  Tobacco Use: Medium Risk (01/18/2023)   Received from [facility]    Readmission Risk Interventions    02/18/2023   10:00 AM  Readmission Risk Prevention Plan  Transportation Screening Complete  PCP or Specialist Appt within 3-5 Days Complete  HRI or Home Care Consult Complete  Social Work Consult for Recovery Care Planning/Counseling Complete  Palliative Care Screening Not Applicable  Medication Review Oceanographer) Complete

## 2023-02-23 NOTE — Plan of Care (Signed)
  Problem: Clinical Measurements: Goal: Respiratory complications will improve Outcome: Progressing   Problem: Clinical Measurements: Goal: Cardiovascular complication will be avoided Outcome: Progressing   Problem: Elimination: Goal: Will not experience complications related to urinary retention Outcome: Progressing   Problem: Pain Managment: Goal: General experience of comfort will improve Outcome: Progressing   Problem: Safety: Goal: Ability to remain free from injury will improve Outcome: Progressing   

## 2023-02-23 NOTE — Progress Notes (Signed)
Progress Note   Patient: [name] NWG:[id number] DOB: Apr 28, 1935 DOA: 02/17/2023     6 DOS: the patient was seen and examined on 02/23/2023   Brief hospital course: [name] is a 88 y.o. male with medical history significant of HFrEF, atrial fibrillation, stage III CKD, hypertension, hyperlipidemia, b/l BKA, type 2 diabetes presenting with acute respiratory failure with hypoxia, acute on chronic HFrEF.  Limited history in the setting of age and dementia.  History primarily from patient's son [name] ([id number]) who is also HCPOA.  Per report, patient with increased work of breathing cough over 1 to 2 days.  No reported fevers or chills.  No reported nausea or vomiting.  Per report, has been compliant with diuretic regimen at facility.  No reported chest pain.  No focal hemiparesis reported. Presented to the ER afebrile, hemodynamically stable.  Satting in the mid 80s on room air, transition to 2 L nasal cannula to keep O2 sats greater than 94%.  White count 9.5, hemoglobin 12.6, platelets 247, troponin 1 11-1 16, COVID flu and RSV negative, creatinine 1.9.  BNP 1600.  Chest x-ray with cardiomegaly and interstitial pulmonary edema.     Assessment and Plan: PAF (paroxysmal atrial fibrillation) Baseline atrial fibrillation Continue Xarelto, beta-blocker S/p sotalol 80 mg p.o. daily by cardiology on 9/29, discontinued on 10/1 9/30 patient converted into sinus rhythm, cardiology recommended to continue sotalol and monitor on telemetry, possible discharge on 10/2 Follow-up cardiology recommendations 10/1 sotalol was discontinued by cardiology due to QT prolongation. EKG this morning with QTc 550 better.  Continue metoprolol 75 mg p.o. twice daily increased from 50 mg twice daily dose on 10/1. Continue telemetry monitoring.   Acute on chronic systolic CHF (congestive heart failure)  2D echo March 2024 with EF of 35-40% as well as moderate mitral regurgitation Positive acute  volume overload with noted BNP of 1600 as well as pulmonary edema chest x-ray S/p Lasix IV 40 mg x doses, s/p Lasix 40 mg IV bid on 9/28.  Currently on Bumex 2 mg daily. His chest pain, shortness of breath improved.  He is off supplemental oxygen. Continue GDMT Cardiology follow-up appreciated BNP 1615--1437--1656  Continue Bumex 2 mg p.o. daily, Toprol succinate increased to 75 mg p.o. twice daily 10/1   Acute respiratory failure with hypoxia. Resolved  Decompensated respiratory status due to acute on chronic HFrEF, BNP 1600 with noted pulmonary edema on chest x-ray No overt infection noted. S/p IV Lasix as above On 9/27, started Brovana and budesonide nebulizer twice daily.  Respiratory failure resolved, currently patient is saturating well on room air. Discontinued Brovana and budesonide, transition to [employer] inhaler on 9/29, continue DuoNeb as needed   Chronic kidney disease, stage 3a (HCC) Baseline creatinine 1.5-2.1 with GFR in the 30s to 40s Creatinine 1.6 today with GFR in the 40s Continue Bumex therapy.  Monitor daily renal function with diuresis   Type II diabetes mellitus with renal manifestations (HCC) A1c 8.6. Continue Accu-Cheks, sliding scale as per full protocol. Continue Semglee 10 units, NovoLog 3 units premeal. Hypoglycemia protocol.   Type 2 MI  Troponin 100s without active chest pain in setting demand ischemia due to acute on chronic HFrEF EKG stable, Likely secondary demand ischemia Continue home regimen including baby aspirin Follow-up cardiology recommendations   Dementia and depression,  continue Namenda, Prozac and Depakote home dose Continue supportive care   UTI prophylaxis,  Home dose trimethoprim discontinued due to elevated BUN and creatinine. Resume upon discharge.  Obesity  with BMI 47.48 Diet, exercise and weight reduction advised.       Out of bed to chair. Incentive spirometry. Nursing supportive care. Fall, aspiration  precautions. DVT prophylaxis   Code Status: Full Code  Subjective: Patient is seen and examined today morning.  He is lying comfortably.  Processes at bedside.  Denies any complaints of chest pain or shortness of breath.  He is off supplemental oxygen.  Eating fair.  Physical Exam: Vitals:   02/23/23 0725 02/23/23 0741 02/23/23 1135 02/23/23 1408  BP:  (!) 142/74 (!) 150/97   Pulse: (!) 52 (!) 40 72   Resp: 20 16 16    Temp:  (!) 97.5 F (36.4 C) 98 F (36.7 C)   TempSrc:  Oral Oral   SpO2: 95% 93% 92% 94%  Weight:      Height:        General - Elderly obese Caucasian male, no apparent distress HEENT - PERRLA, EOMI, atraumatic head, non tender sinuses. Lung - Clear, bibasilar Rales. Heart - S1, S2 heard, no murmurs, rubs, trace edema over bilateral stumps Abdomen - Soft, non tender non distended, bowel sounds good Neuro - Alert, awake and oriented x 3, non focal exam. Skin - Warm and dry. b/l BKA  Data Reviewed:      Latest Ref Rng & Units 02/23/2023    4:26 AM 02/22/2023    6:09 AM 02/21/2023    3:07 AM  CBC  WBC 4.0 - 10.5 K/uL 8.4  9.7  10.3   Hemoglobin 13.0 - 17.0 g/dL 16.1  09.6  04.5   Hematocrit 39.0 - 52.0 % 41.0  41.7  39.9   Platelets 150 - 400 K/uL 244  265  253       Latest Ref Rng & Units 02/23/2023    4:26 AM 02/22/2023    6:09 AM 02/21/2023    3:07 AM  BMP  Glucose 70 - 99 mg/dL 409  811  914   BUN 8 - 23 mg/dL 39  44  54   Creatinine 0.61 - 1.24 mg/dL 7.82  9.56  2.13   Sodium 135 - 145 mmol/L 139  138  141   Potassium 3.5 - 5.1 mmol/L 3.8  4.3  5.0   Chloride 98 - 111 mmol/L 101  106  105   CO2 22 - 32 mmol/L 29  22  22    Calcium 8.9 - 10.3 mg/dL 8.7  8.6  8.7    No results found.   Family Communication: Discussed with patient, he understands and agrees. All questions answereed.    Disposition: Status is: Inpatient Remains inpatient appropriate because: HD, SNF placement  Planned Discharge Destination: Skilled nursing  facility     Time spent: 39 minutes  Author: Marcelino Duster, MD 02/23/2023 4:20 PM Secure chat 7am to 7pm For on call review [email or web address].

## 2023-02-23 NOTE — Progress Notes (Signed)
Physical Therapy Treatment Patient Details Name: [name] MRN: [id number] DOB: 1934/05/26 Today's Date: 02/23/2023   History of Present Illness [name] is a 88 y.o. male with medical history significant of HFrEF, atrial fibrillation, stage III CKD, hypertension, hyperlipidemia, type 2 diabetes presenting with acute respiratory failure with hypoxia, acute on chronic HFrEF. Chest x-ray with cardiomegaly and interstitial pulmonary edema.    PT Comments  Pt is received in bed, he is slightly agitated/frustrated at beginning of session due to "no one comes and talks to me about why I'm here" and "I want to get a hold of my wife", with encouragement he is agreeable to PT session. Pt performs bed mobility close sup A and transfers min A. Pt requires frequent multimodal cuing for hand placement and slight facilitation by PT to perform STS. Unable to progress mobility at this time secondary to fatigue. SpO2 93-94% throughout session. Overall, Pt demonstrates slight progression towards PT goals during today's session. Pt would benefit from cont skilled PT to address above deficits and promote optimal return to PLOF.   If plan is discharge home, recommend the following: A little help with walking and/or transfers;A little help with bathing/dressing/bathroom;Assistance with cooking/housework;Help with stairs or ramp for entrance;Assist for transportation;Supervision due to cognitive status   Can travel by private vehicle     No  Equipment Recommendations  None recommended by PT    Recommendations for Other Services       Precautions / Restrictions Precautions Precautions: Fall Precaution Comments: B BKA, wears prosthesis Required Braces or Orthoses: Other Brace Other Brace: B prosthesis     Mobility  Bed Mobility Overal bed mobility: Needs Assistance Bed Mobility: Supine to Sit     Supine to sit: HOB elevated, Used rails, Supervision     General bed mobility  comments: Pt able to perform bed mobility using bed railings, HOB elevated, close sup A for safety    Transfers Overall transfer level: Needs assistance Equipment used: Rolling walker (2 wheels) Transfers: Bed to chair/wheelchair/BSC     Step pivot transfers: Min assist       General transfer comment: cuing for hand placement and eyes-over-toes technique to facilitate STS; Pt requires min A for completion of STS task    Ambulation/Gait               General Gait Details: Deferred at this time secondary to increased fatigue   Stairs             Wheelchair Mobility     Tilt Bed    Modified Rankin (Stroke Patients Only)       Balance Overall balance assessment: Needs assistance Sitting-balance support: No upper extremity supported Sitting balance-Leahy Scale: Normal Sitting balance - Comments: Pt able to maintain seated EOB balance during donning of B protheses with no LOB noted   Standing balance support: Bilateral upper extremity supported, During functional activity, Reliant on assistive device for balance Standing balance-Leahy Scale: Good Standing balance comment: Pt able to maintain static standing balance using RW with cuing for upright posture                            Cognition Arousal: Alert Behavior During Therapy: WFL for tasks assessed/performed Overall Cognitive Status: History of cognitive impairments - at baseline  General Comments: Pt slightly agitated/frustrated at beginning of session due to not being moved out of bed but able to calm down with encourage; cooperative during therapy        Exercises      General Comments General comments (skin integrity, edema, etc.): SpO2 94% on RA pre-mobility, SpO2 93% on RA post-mobility      Pertinent Vitals/Pain Pain Assessment Pain Assessment: No/denies pain    Home Living                          Prior Function             PT Goals (current goals can now be found in the care plan section) Acute Rehab PT Goals Patient Stated Goal: to get better PT Goal Formulation: With patient Time For Goal Achievement: 03/08/23 Potential to Achieve Goals: Good Progress towards PT goals: Progressing toward goals    Frequency    Min 1X/week      PT Plan      Co-evaluation              AM-PAC PT "6 Clicks" Mobility   Outcome Measure  Help needed turning from your back to your side while in a flat bed without using bedrails?: A Little Help needed moving from lying on your back to sitting on the side of a flat bed without using bedrails?: A Little Help needed moving to and from a bed to a chair (including a wheelchair)?: A Little Help needed standing up from a chair using your arms (e.g., wheelchair or bedside chair)?: A Little Help needed to walk in hospital room?: A Lot Help needed climbing 3-5 steps with a railing? : A Lot 6 Click Score: 16    End of Session Equipment Utilized During Treatment: Gait belt Activity Tolerance: Patient limited by fatigue Patient left: in chair;with call bell/phone within reach;with chair alarm set Nurse Communication: Mobility status PT Visit Diagnosis: Unsteadiness on feet (R26.81);Muscle weakness (generalized) (M62.81)     Time: 6578-4696 PT Time Calculation (min) (ACUTE ONLY): 18 min  Charges:                            Elmon Else, SPT    [name] 02/23/2023, 2:15 PM

## 2023-02-24 ENCOUNTER — Encounter: Payer: Medicare Other | Admitting: Family

## 2023-02-24 DIAGNOSIS — I4581 Long QT syndrome: Secondary | ICD-10-CM

## 2023-02-24 DIAGNOSIS — N1831 Chronic kidney disease, stage 3a: Secondary | ICD-10-CM | POA: Diagnosis not present

## 2023-02-24 DIAGNOSIS — J9601 Acute respiratory failure with hypoxia: Secondary | ICD-10-CM | POA: Diagnosis not present

## 2023-02-24 DIAGNOSIS — I5023 Acute on chronic systolic (congestive) heart failure: Secondary | ICD-10-CM | POA: Diagnosis not present

## 2023-02-24 DIAGNOSIS — E1122 Type 2 diabetes mellitus with diabetic chronic kidney disease: Secondary | ICD-10-CM | POA: Diagnosis not present

## 2023-02-24 LAB — CBC
HCT: 40.3 % (ref 39.0–52.0)
Hemoglobin: 13.6 g/dL (ref 13.0–17.0)
MCH: 29.4 pg (ref 26.0–34.0)
MCHC: 33.7 g/dL (ref 30.0–36.0)
MCV: 87.2 fL (ref 80.0–100.0)
Platelets: 245 10*3/uL (ref 150–400)
RBC: 4.62 MIL/uL (ref 4.22–5.81)
RDW: 14.6 % (ref 11.5–15.5)
WBC: 8.4 10*3/uL (ref 4.0–10.5)
nRBC: 0 % (ref 0.0–0.2)

## 2023-02-24 LAB — BASIC METABOLIC PANEL
Anion gap: 11 (ref 5–15)
BUN: 42 mg/dL — ABNORMAL HIGH (ref 8–23)
CO2: 25 mmol/L (ref 22–32)
Calcium: 8.2 mg/dL — ABNORMAL LOW (ref 8.9–10.3)
Chloride: 101 mmol/L (ref 98–111)
Creatinine, Ser: 1.79 mg/dL — ABNORMAL HIGH (ref 0.61–1.24)
GFR, Estimated: 37 mL/min — ABNORMAL LOW (ref >60)
Glucose, Bld: 153 mg/dL — ABNORMAL HIGH (ref 70–99)
Potassium: 3.5 mmol/L (ref 3.5–5.1)
Sodium: 137 mmol/L (ref 135–145)

## 2023-02-24 LAB — GLUCOSE, CAPILLARY
Glucose-Capillary: 123 mg/dL — ABNORMAL HIGH (ref 70–99)
Glucose-Capillary: 139 mg/dL — ABNORMAL HIGH (ref 70–99)
Glucose-Capillary: 208 mg/dL — ABNORMAL HIGH (ref 70–99)
Glucose-Capillary: 259 mg/dL — ABNORMAL HIGH (ref 70–99)

## 2023-02-24 NOTE — Progress Notes (Signed)
Occupational Therapy Treatment Patient Details Name: [name] MRN: [id number] DOB: 23-Apr-1935 Today's Date: 02/24/2023   History of present illness [name] is a 88 y.o. male with medical history significant of HFrEF, atrial fibrillation, stage III CKD, hypertension, hyperlipidemia, type 2 diabetes presenting with acute respiratory failure with hypoxia, acute on chronic HFrEF. Chest x-ray with cardiomegaly and interstitial pulmonary edema.   OT comments  Pt seen for OT tx today. Pt sleeping soundly upon arrival, requiring initial VC, tactile cues, and environmental changes to improve alertness to session (lights on, HOB elevated). Pt required increased assist today for all aspects of ADL mobility and LB ADL tasks based on previous therapy sessions. Pt did endorse fatigue and nursing notes he has been sleeping a lot. Pt progressing and continues to benefit from skilled OT services.       If plan is discharge home, recommend the following:  A lot of help with walking and/or transfers;A lot of help with bathing/dressing/bathroom;Assistance with cooking/housework;Direct supervision/assist for medications management;Direct supervision/assist for financial management;Assist for transportation;Supervision due to cognitive status   Equipment Recommendations  None recommended by OT    Recommendations for Other Services      Precautions / Restrictions Precautions Precautions: Fall Precaution Comments: B BKA, wears prosthesis Restrictions Weight Bearing Restrictions: No Other Position/Activity Restrictions: has B prostheses       Mobility Bed Mobility Overal bed mobility: Needs Assistance Bed Mobility: Supine to Sit     Supine to sit: Mod assist, HOB elevated, Used rails     General bed mobility comments: VC for bed rail use, assist for trunk    Transfers Overall transfer level: Needs assistance Equipment used: Rolling walker (2 wheels) Transfers: Sit  to/from Stand, Bed to chair/wheelchair/BSC Sit to Stand: Min assist, +2 physical assistance, From elevated surface     Step pivot transfers: Min assist, +2 safety/equipment     General transfer comment: VC for hand placement     Balance Overall balance assessment: Needs assistance Sitting-balance support: No upper extremity supported Sitting balance-Leahy Scale: Good     Standing balance support: Bilateral upper extremity supported, During functional activity, Reliant on assistive device for balance Standing balance-Leahy Scale: Fair                             ADL either performed or assessed with clinical judgement   ADL Overall ADL's : Needs assistance/impaired     Grooming: Sitting;Set up;Wash/dry face;Supervision/safety               Lower Body Dressing: Sit to/from stand;Minimal assistance Lower Body Dressing Details (indicate cue type and reason): MIN A for donning prostheses     Toileting- Clothing Manipulation and Hygiene: Sit to/from stand;Maximal assistance Toileting - Clothing Manipulation Details (indicate cue type and reason): Pt required CGA from OT in standing for balance while requiring BUE support on RW resulting in need for MAX A pericare from nurse tech     Functional mobility during ADLs: Minimal assistance;Rolling walker (2 wheels);+2 for physical assistance      Extremity/Trunk Assessment              Vision       Perception     Praxis      Cognition Arousal: Lethargic Behavior During Therapy: WFL for tasks assessed/performed Overall Cognitive Status: History of cognitive impairments - at baseline  General Comments: Pt initially lethargic requiring extra time, upright positioning, and lights on to help improve alertness        Exercises      Shoulder Instructions       General Comments      Pertinent Vitals/ Pain       Pain Assessment Pain Assessment:  No/denies pain  Home Living                                          Prior Functioning/Environment              Frequency  Min 1X/week        Progress Toward Goals  OT Goals(current goals can now be found in the care plan section)  Progress towards OT goals: Progressing toward goals  Acute Rehab OT Goals OT Goal Formulation: With patient Time For Goal Achievement: 03/08/23 Potential to Achieve Goals: Good  Plan      Co-evaluation                 AM-PAC OT "6 Clicks" Daily Activity     Outcome Measure   Help from another person eating meals?: None Help from another person taking care of personal grooming?: A Little Help from another person toileting, which includes using toliet, bedpan, or urinal?: A Lot Help from another person bathing (including washing, rinsing, drying)?: A Lot Help from another person to put on and taking off regular upper body clothing?: A Little Help from another person to put on and taking off regular lower body clothing?: A Lot 6 Click Score: 16    End of Session Equipment Utilized During Treatment: Rolling walker (2 wheels)  OT Visit Diagnosis: Muscle weakness (generalized) (M62.81);Unsteadiness on feet (R26.81);Other abnormalities of gait and mobility (R26.89)   Activity Tolerance Patient tolerated treatment well   Patient Left in chair;with call bell/phone within reach;with chair alarm set   Nurse Communication Other (comment) (session finished,in recliner)        Time: 1610-9604 OT Time Calculation (min): 20 min  Charges: OT General Charges $OT Visit: 1 Visit OT Treatments $Self Care/Home Management : 8-22 mins  Arman Filter., MPH, MS, OTR/L ascom [phone] 02/24/23, 1:05 PM

## 2023-02-24 NOTE — Progress Notes (Signed)
[facility] CARDIOLOGY CONSULT NOTE       Patient ID: [name] MRN: [id number] DOB/AGE: 1935-05-21 88 y.o.  Admit date: 02/17/2023 Referring Physician Dr. Doree Albee Primary Physician Laren Everts, NP Primary Cardiologist Marijo Conception, NP Reason for Consultation AoCHF  HPI: [name] is a 88 y.o. male  with a past medical history of paroxysmal AF (Xarelto), chronic HFrEF (35-40%, rWMAs 07/2022), 1vCAD (CTO ostial LAD, 06/2019), CKD III, DM2, hx bilat BKA who presented to the ED on 02/17/2023 for chest pain and shortness of breath. Cardiology was consulted for further evaluation.   Interval history: -Patient feeling well this afternoon, OOB in bedside chair. Denies any issues. -QTc improved on morning EKG, 520 ms on our review. No evidence of arrhythmias on telemetry.  -Without recurrence of shortness of breath, remains on room air.   Review of systems complete and found to be negative unless listed above    Past Medical History:  Diagnosis Date   Acute respiratory failure with hypoxia (HCC) 02/08/2019   Arthritis    Atrial fibrillation (HCC)    Carcinoma of prostate (HCC)    CHF (congestive heart failure) (HCC)    Chronic kidney disease    Diabetes mellitus without complication (HCC)    ED (erectile dysfunction)    Frequent urination    Hyperlipidemia    Hypertension    Leukocytosis 09/06/2016   Moderate mitral insufficiency    Myocardial injury 08/14/2022   Peripheral vascular disease (HCC)    Pneumonia 04/2016   Prostate cancer (HCC)    Sleep apnea    OSA--USE C-PAP   Ulcer of left foot due to type 2 diabetes mellitus (HCC)    Urinary stress incontinence, male     Past Surgical History:  Procedure Laterality Date   AMPUTATION Left 01/12/2017   Procedure: AMPUTATION BELOW KNEE;  Surgeon: Annice Needy, MD;  Location: [facility] ORS;  Service: General;  Laterality: Left;   AMPUTATION Right 12/27/2018   Procedure: AMPUTATION BELOW KNEE;   Surgeon: Annice Needy, MD;  Location: [facility];  Service: General;  Laterality: Right;   APLIGRAFT PLACEMENT Left 10/15/2016   Procedure: APLIGRAFT PLACEMENT;  Surgeon: Recardo Evangelist, DPM;  Location: [facility];  Service: Podiatry;  Laterality: Left;  necrotic ulcer   CHOLECYSTECTOMY     COLONOSCOPY WITH PROPOFOL N/A 01/02/2019   Procedure: COLONOSCOPY WITH PROPOFOL;  Surgeon: Midge Minium, MD;  Location: [facility] ENDOSCOPY;  Service: Endoscopy;  Laterality: N/A;   COLONOSCOPY WITH PROPOFOL N/A 01/05/2019   Procedure: COLONOSCOPY WITH PROPOFOL;  Surgeon: Midge Minium, MD;  Location: [facility] ENDOSCOPY;  Service: Endoscopy;  Laterality: N/A;   ESOPHAGOGASTRODUODENOSCOPY (EGD) WITH PROPOFOL N/A 01/02/2019   Procedure: ESOPHAGOGASTRODUODENOSCOPY (EGD) WITH PROPOFOL;  Surgeon: Midge Minium, MD;  Location: [facility] ENDOSCOPY;  Service: Endoscopy;  Laterality: N/A;   EYE SURGERY Bilateral    Cataract Extraction with IOL   HIP ARTHROPLASTY Right 10/13/2018   Procedure: ARTHROPLASTY BIPOLAR HIP (HEMIARTHROPLASTY);  Surgeon: Deeann Saint, MD;  Location: [facility];  Service: Orthopedics;  Laterality: Right;   IRRIGATION AND DEBRIDEMENT FOOT Left 10/15/2016   Procedure: IRRIGATION AND DEBRIDEMENT FOOT-EXCISIONAL DEBRIDEMENT OF SKIN 3RD, 4TH AND 5TH TOES WITH APPLICATION OF APLIGRAFT;  Surgeon: Recardo Evangelist, DPM;  Location: [facility];  Service: Podiatry;  Laterality: Left;  necrotic,gangrene   IRRIGATION AND DEBRIDEMENT FOOT Left 12/09/2016   Procedure: IRRIGATION AND DEBRIDEMENT FOOT-MUSCLE/FASCIA, RESECTION OF FOURTH AND FIFTH METATARSAL NECROTIC BONE;  Surgeon: Recardo Evangelist, DPM;  Location: [facility];  Service: Podiatry;  Laterality: Left;   IRRIGATION AND DEBRIDEMENT FOOT Right 12/23/2018   Procedure: IRRIGATION AND DEBRIDEMENT FOOT and wound vac application;  Surgeon: Gwyneth Revels, DPM;  Location: [facility];  Service: Podiatry;  Laterality: Right;   LEFT HEART CATH N/A 06/25/2019   Procedure: Left Heart Cath and  Coronary Angiography;  Surgeon: Marcina Millard, MD;  Location: [facility] INVASIVE CV LAB;  Service: Cardiovascular;  Laterality: N/A;   LEFT HEART CATH AND CORONARY ANGIOGRAPHY N/A 06/05/2018   Procedure: LEFT HEART CATH AND CORONARY ANGIOGRAPHY;  Surgeon: Alwyn Pea, MD;  Location: [facility];  Service: Cardiovascular;  Laterality: N/A;   LOWER EXTREMITY ANGIOGRAPHY Left 08/16/2016   Procedure: Lower Extremity Angiography;  Surgeon: Annice Needy, MD;  Location: [facility];  Service: Cardiovascular;  Laterality: Left;   LOWER EXTREMITY ANGIOGRAPHY Left 09/01/2016   Procedure: Lower Extremity Angiography;  Surgeon: Annice Needy, MD;  Location: [facility];  Service: Cardiovascular;  Laterality: Left;   LOWER EXTREMITY ANGIOGRAPHY Left 10/14/2016   Procedure: Lower Extremity Angiography;  Surgeon: Annice Needy, MD;  Location: [facility];  Service: Cardiovascular;  Laterality: Left;   LOWER EXTREMITY ANGIOGRAPHY Left 12/20/2016   Procedure: Lower Extremity Angiography;  Surgeon: Annice Needy, MD;  Location: [facility];  Service: Cardiovascular;  Laterality: Left;   LOWER EXTREMITY ANGIOGRAPHY Right 12/18/2018   Procedure: LOWER EXTREMITY ANGIOGRAPHY;  Surgeon: Annice Needy, MD;  Location: [facility];  Service: Cardiovascular;  Laterality: Right;   LOWER EXTREMITY INTERVENTION  09/01/2016   Procedure: Lower Extremity Intervention;  Surgeon: Annice Needy, MD;  Location: [facility];  Service: Cardiovascular;;   PROSTATE SURGERY     removal   TONSILLECTOMY     as a child    Medications Prior to Admission  Medication Sig Dispense Refill Last Dose   aspirin 81 MG EC tablet Take 81 mg by mouth daily.   02/17/2023 at 0617   atorvastatin (LIPITOR) 80 MG tablet Take 80 mg by mouth at bedtime.   02/16/2023 at 1823   bisacodyl (DULCOLAX) 5 MG EC tablet Take 5 mg by mouth daily as needed for moderate constipation.   prn at unk   divalproex  (DEPAKOTE) 500 MG DR tablet Take 500 mg by mouth daily.    02/17/2023 at 0617   ferrous sulfate 325 (65 FE) MG tablet Take 325 mg by mouth daily with breakfast.   02/16/2023 at 0917   FLUoxetine HCl 60 MG TABS Take 1 tablet by mouth daily.   02/17/2023 at 0617   furosemide (LASIX) 40 MG tablet Take 1 tablet (40 mg total) by mouth daily. 30 tablet 1 02/17/2023 at 0617   isosorbide mononitrate (IMDUR) 30 MG 24 hr tablet Take 60 mg by mouth daily.   02/17/2023 at 0617   LANTUS 100 UNIT/ML injection Inject 45 Units into the skin at bedtime.   02/16/2023 at 1822   memantine (NAMENDA) 10 MG tablet Take 10 mg by mouth 2 (two) times daily.   02/17/2023 at 0617   metoprolol tartrate (LOPRESSOR) 25 MG tablet Take 25 mg by mouth 2 (two) times daily.   02/17/2023 at 0617   Multiple Vitamin (MULTIVITAMIN) tablet Take 1 tablet by mouth daily.   02/17/2023 at 0617   nitroGLYCERIN (NITROSTAT) 0.4 MG SL tablet Place 1 tablet (0.4 mg total) under the tongue every 5 (five) minutes as needed for chest pain. 20 tablet 12 prn at unk  NOVOLOG FLEXPEN 100 UNIT/ML FlexPen Inject 6 Units into the skin 3 (three) times daily with meals. (Patient taking differently: Inject 6-8 Units into the skin 3 (three) times daily with meals. 6 units at breakfast and dinner and 8 units at lunch) 15 mL 0 02/16/2023 at 1622   Rivaroxaban (XARELTO) 15 MG TABS tablet Take 1 tablet (15 mg total) by mouth daily with supper. Hold Xarelto for 3 days.  Start on January 09, 2019. 30 tablet 1 02/16/2023 at 1833   trimethoprim (TRIMPEX) 100 MG tablet Take 100 mg by mouth daily.   02/17/2023 at 0617   Vibegron (GEMTESA) 75 MG TABS Take 1 tablet (75 mg total) by mouth daily. 30 tablet 11 02/17/2023 at 0617   Ensure Max Protein (ENSURE MAX PROTEIN) LIQD Take 330 mLs (11 oz total) by mouth 2 (two) times daily. (Patient taking differently: Take 11 oz by mouth daily.)      FLUoxetine (PROZAC) 40 MG capsule Take 40 mg by mouth daily. (Patient not taking: Reported on  02/17/2023)   Not Taking   Insulin Glargine (BASAGLAR KWIKPEN) 100 UNIT/ML Inject 25 Units into the skin daily. (Resume usual home dose if sugars are elevated) (Patient not taking: Reported on 02/17/2023)   Not Taking   polycarbophil (FIBERCON) 625 MG tablet Take 625 mg by mouth daily. (Patient not taking: Reported on 02/17/2023)   Not Taking   Social History   Socioeconomic History   Marital status: Married    Spouse name: Not on file   Number of children: Not on file   Years of education: Not on file   Highest education level: Not on file  Occupational History   Occupation: retired  Tobacco Use   Smoking status: Former    Current packs/day: 0.00    Types: Cigarettes    Quit date: 10/14/1994    Years since quitting: 28.3    Passive exposure: Past   Smokeless tobacco: Never  Vaping Use   Vaping status: Never Used  Substance and Sexual Activity   Alcohol use: No    Alcohol/week: 0.0 standard drinks of alcohol   Drug use: No   Sexual activity: Not on file  Other Topics Concern   Not on file  Social History Narrative   Lives at home with wife, has a wheelchair   Social Determinants of Health   Financial Resource Strain: Low Risk  (01/31/2018)   Received from [facility], [facility]   Overall Financial Resource Strain (CARDIA)    Difficulty of Paying Living Expenses: Not very hard  Food Insecurity: No Food Insecurity (08/15/2022)   Hunger Vital Sign    Worried About Running Out of Food in the Last Year: Never true    Ran Out of Food in the Last Year: Never true  Transportation Needs: No Transportation Needs (08/15/2022)   PRAPARE - Administrator, Civil Service (Medical): No    Lack of Transportation (Non-Medical): No  Physical Activity: Not on file  Stress: Not on file  Social Connections: Not on file  Intimate Partner Violence: Unknown (08/15/2022)   Humiliation, Afraid, Rape, and Kick questionnaire    Fear of Current or  Ex-Partner: No    Emotionally Abused: No    Physically Abused: Not on file    Sexually Abused: No    Family History  Problem Relation Age of Onset   Hypertension Mother    Diabetes Mother    Prostate cancer Neg Hx  Bladder Cancer Neg Hx    Kidney disease Neg Hx      Vitals:   02/24/23 0152 02/24/23 0401 02/24/23 0931 02/24/23 1126  BP: 118/71 (!) 100/56 (!) 142/48 136/60  Pulse:  61 (!) 47 (!) 52  Resp: 20 20 16 16   Temp: 98.5 F (36.9 C) 97.6 F (36.4 C) 97.7 F (36.5 C) (!) 97.5 F (36.4 C)  TempSrc: Oral Oral    SpO2: 94% 92% 92% 94%  Weight:      Height:        PHYSICAL EXAM General: Chronically ill appearing, well nourished, in no acute distress sitting upright in bedside chair HEENT: Normocephalic and atraumatic. Neck: No JVD.  Lungs: Normal respiratory effort on room air. Clear bilaterally to auscultation. No wheezes, crackles, rhonchi.  Heart: HRRR. Normal S1 and S2 without gallops or murmurs.  Abdomen: Non-distended appearing.  Msk: Normal strength and tone for age. Extremities: Warm and well perfused. No clubbing, cyanosis. S/p bilat BKA.  Neuro: Alert and oriented X 3. Psych: Answers questions appropriately.   Labs: Basic Metabolic Panel: Recent Labs    02/22/23 0609 02/23/23 0426 02/24/23 0411  NA 138 139 137  K 4.3 3.8 3.5  CL 106 101 101  CO2 22 29 25   GLUCOSE 158* 121* 153*  BUN 44* 39* 42*  CREATININE 1.59* 1.64* 1.79*  CALCIUM 8.6* 8.7* 8.2*  MG 2.4 2.3  --    Liver Function Tests: No results for input(s): "AST", "ALT", "ALKPHOS", "BILITOT", "PROT", "ALBUMIN" in the last 72 hours.  No results for input(s): "LIPASE", "AMYLASE" in the last 72 hours. CBC: Recent Labs    02/23/23 0426 02/24/23 0411  WBC 8.4 8.4  HGB 14.0 13.6  HCT 41.0 40.3  MCV 86.9 87.2  PLT 244 245   Cardiac Enzymes: No results for input(s): "CKTOTAL", "CKMB", "CKMBINDEX", "TROPONINIHS" in the last 72 hours.  BNP: No results for input(s): "BNP" in the  last 72 hours.  D-Dimer: No results for input(s): "DDIMER" in the last 72 hours. Hemoglobin A1C: No results for input(s): "HGBA1C" in the last 72 hours. Fasting Lipid Panel: No results for input(s): "CHOL", "HDL", "LDLCALC", "TRIG", "CHOLHDL", "LDLDIRECT" in the last 72 hours. Thyroid Function Tests: No results for input(s): "TSH", "T4TOTAL", "T3FREE", "THYROIDAB" in the last 72 hours.  Invalid input(s): "FREET3" Anemia Panel: No results for input(s): "VITAMINB12", "FOLATE", "FERRITIN", "TIBC", "IRON", "RETICCTPCT" in the last 72 hours.   Radiology: [facility] Chest Port 1 View  Result Date: 02/17/2023 CLINICAL DATA:  Right chest pain and shortness of breath EXAM: PORTABLE CHEST 1 VIEW COMPARISON:  Chest radiograph 08/14/2022 FINDINGS: The heart is enlarged.  The upper mediastinal contours are normal. There is vascular congestion with possible mild pulmonary interstitial edema. There is no focal airspace consolidation. There is no pleural effusion or pneumothorax There is no acute osseous abnormality. IMPRESSION: Cardiomegaly with vascular congestion and possible mild pulmonary interstitial edema. Electronically Signed   By: Lesia Hausen M.D.   On: 02/17/2023 09:33    ECHO 07/2022:  1. Left ventricular ejection fraction, by estimation, is 35-40%. The left ventricle has moderately decreased function. The LV demonstrates regional wall motion abnormalities (see scoring diagram/findings for description). Left ventricular  diastolic parameters were normal.   2. Right ventricular systolic function is normal. The right ventricular  size is normal.   3. Left atrial size was mild to moderately dilated.   4. The mitral valve is normal in structure. Mild to moderate mitral valve  regurgitation. No evidence  of mitral stenosis.   5. The aortic valve is normal in structure. Aortic valve regurgitation is  not visualized. No aortic stenosis is present.   6. The inferior vena cava is normal in size with greater  than 50%  respiratory variability, suggesting right atrial pressure of 3 mmHg.   TELEMETRY reviewed by me 02/24/2023: NSR PACs rate 50-60s  EKG reviewed by me: sinus rhythm PACs  Data reviewed by me 02/24/2023: last 24h vitals tele labs imaging I/O hospitalist progress note  Principal Problem:   CHF exacerbation (HCC) Active Problems:   NSTEMI (non-ST elevated myocardial infarction) (HCC)   Acute respiratory failure with hypoxia (HCC)   PAF (paroxysmal atrial fibrillation) (HCC)   Chronic kidney disease, stage 3a (HCC)   Acute on chronic systolic CHF (congestive heart failure) (HCC)   Type II diabetes mellitus with renal manifestations (HCC)    ASSESSMENT AND PLAN:  [name] is a 88 y.o. male  with a past medical history of paroxysmal AF (Xarelto), chronic HFrEF (35-40%, rWMAs 07/2022), 1vCAD (CTO ostial LAD, 06/2019), CKD III, DM2, hx bilat BKA who presented to the ED on 02/17/2023 for chest pain and shortness of breath. Cardiology was consulted for further evaluation.   # Acute on chronic HFrEF (EF 35-40% in 07/2022) # Acute hypoxic respiratory failure # Hypertension Patient presenting with episode of chest pain and shortness of breath which resolved with SL nitroglycerin. Noted to be hypoxic in the 80s and placed on supplemental O2. BNP elevated to 1600 on admission. Troponins elevated but flat. Symptoms resolved and patient now weaned to room air. -S/p IV lasix. Continue bumex 2 mg daily.  -metoprolol succinate 75 mg bid. Further additions of GDMT limited by renal function.   # Paroxysmal atrial fibrillation # QT prolongation Patient in AF on presentation with rates in the 110s. Converted to NSR now sustaining in rhythm. EKG this morning with QTc 520 msec on our review, improving. No evidence of arrhythmias on telemetry.  -Sotalol discontinued, last dose 9/30.  -Continue metoprolol to 75 mg twice daily for rate control.  -Continue home xarelto 15 mg daily (reduced dose  appropriate given renal function). -Plan for follow up in 1 week with EKG recheck at that time.   # Demand ischemia # Coronary artery disease  Patient with hx of CAD medically managed with troponin elevation 111 > 116 on presentation in the setting of acute heart failure and acute respiratory failure.  -Troponin elevation most consistent with demand/supply mismatch and not ACS. -Continue home asa, atorvastatin.  -Continue imdur 30 mg daily.   # Chronic kidney disease stage IIIa Baseline between (1.5-2.1). Cr. 1.79 this AM. -Continue to monitor renal function with diuresis.  -Monitor and replenish electrolytes for a goal K >4, Mag >2    Ok for discharge today from a cardiac perspective. Will arrange for follow up in clinic with Marijo Conception, NP in 1 week. Patient is pending placement for assisted living. Cardiology will sign off. Please haiku with questions or re-engage if needed.    This patient's plan of care was discussed and created with Dr. Corky Sing and he is in agreement.  Signed: Gale Journey, PA-C  02/24/2023, 1:01 PM [facility] Cardiology

## 2023-02-24 NOTE — Care Management Important Message (Signed)
Important Message  Patient Details  Name: [name] MRN: [id number] Date of Birth: 01/22/1935   Important Message Given:  Yes - Medicare IM     Johnell Comings 02/24/2023, 3:36 PM

## 2023-02-24 NOTE — Progress Notes (Signed)
Progress Note   Patient: [name] ZOX:[id number] DOB: May 04, 1935 DOA: 02/17/2023     7 DOS: the patient was seen and examined on 02/24/2023   Brief hospital course: Hue Feeman is a 88 y.o. male with medical history significant of HFrEF, atrial fibrillation, stage III CKD, hypertension, hyperlipidemia, b/l BKA, type 2 diabetes presenting with acute respiratory failure with hypoxia, acute on chronic HFrEF.  Limited history in the setting of age and dementia.  History primarily from patient's son [name] ([id number]) who is also HCPOA.  Per report, patient with increased work of breathing cough over 1 to 2 days.  No reported fevers or chills.  No reported nausea or vomiting.  Per report, has been compliant with diuretic regimen at facility.  No reported chest pain.  No focal hemiparesis reported. Presented to the ER afebrile, hemodynamically stable.  Satting in the mid 80s on room air, transition to 2 L nasal cannula to keep O2 sats greater than 94%.  White count 9.5, hemoglobin 12.6, platelets 247, troponin 1 11-1 16, COVID flu and RSV negative, creatinine 1.9.  BNP 1600.  Chest x-ray with cardiomegaly and interstitial pulmonary edema.     Assessment and Plan: PAF (paroxysmal atrial fibrillation) Baseline atrial fibrillation Continue Xarelto, beta-blocker S/p sotalol 80 mg p.o. daily by cardiology on 9/29, discontinued on 10/1 9/30 patient converted into sinus rhythm, cardiology recommended to continue sotalol and monitor on telemetry, possible discharge on 10/2 Follow-up cardiology recommendations 10/1 sotalol was discontinued by cardiology due to QT prolongation. EKG this morning with improving QTc.  Continue metoprolol 75 mg p.o. twice daily increased from 50 mg twice daily dose on 10/1. Can be discharged per cardiology. Awaiting placement.   Acute on chronic systolic CHF (congestive heart failure)  2D echo March 2024 with EF of 35-40% as well as moderate mitral  regurgitation Positive acute volume overload with noted BNP of 1600 as well as pulmonary edema chest x-ray S/p Lasix IV 40 mg x doses, s/p Lasix 40 mg IV bid on 9/28.  Currently on Bumex 2 mg daily. His chest pain, shortness of breath improved.  He is off supplemental oxygen. Continue GDMT Cardiology follow-up appreciated BNP 1615--1437--1656  Continue Bumex 2 mg p.o. daily, Toprol succinate 75 mg p.o. twice daily 10/1   Acute respiratory failure with hypoxia. Resolved  Decompensated respiratory status due to acute on chronic HFrEF, BNP 1600 with noted pulmonary edema on chest x-ray No overt infection noted. S/p IV Lasix as above On 9/27, started Brovana and budesonide nebulizer twice daily.  Respiratory failure resolved, currently patient is saturating well on room air. Discontinued Brovana and budesonide, transition to [employer] inhaler on 9/29, continue DuoNeb as needed   Chronic kidney disease, stage 3a (HCC) Baseline creatinine 1.5-2.1 with GFR in the 30s to 40s Creatinine 1.6 today with GFR in the 40s Continue Bumex therapy.  Monitor daily renal function with diuresis   Type II diabetes mellitus with renal manifestations (HCC) A1c 8.6. Continue Accu-Cheks, sliding scale as per full protocol. Continue Semglee 10 units, NovoLog 3 units premeal. Hypoglycemia protocol.   Type 2 MI  Troponin 100s without active chest pain in setting demand ischemia due to acute on chronic HFrEF EKG stable, Likely secondary demand ischemia Continue home regimen including baby aspirin Follow-up cardiology recommendations   Dementia and depression,  continue Namenda, Prozac and Depakote home dose Continue supportive care   UTI prophylaxis,  Home dose trimethoprim discontinued due to elevated BUN and creatinine. Resume upon discharge.  Obesity with BMI 47.48 Diet, exercise and weight reduction advised.       Out of bed to chair. Incentive spirometry. Nursing supportive care. Fall,  aspiration precautions. DVT prophylaxis   Code Status: Full Code  Subjective: Patient is seen and examined today.  He is sitting comfortably, eating lunch Denies any complaints of chest pain or shortness of breath.  He is off supplemental oxygen.   Physical Exam: Vitals:   02/24/23 0152 02/24/23 0401 02/24/23 0931 02/24/23 1126  BP: 118/71 (!) 100/56 (!) 142/48 136/60  Pulse:  61 (!) 47 (!) 52  Resp: 20 20 16 16   Temp: 98.5 F (36.9 C) 97.6 F (36.4 C) 97.7 F (36.5 C) (!) 97.5 F (36.4 C)  TempSrc: Oral Oral    SpO2: 94% 92% 92% 94%  Weight:      Height:        General - Elderly obese Caucasian male, no apparent distress HEENT - PERRLA, EOMI, atraumatic head, non tender sinuses. Lung - Clear, bibasilar Rales. Heart - S1, S2 heard, no murmurs, rubs, trace edema over bilateral stumps Abdomen - Soft, non tender non distended, bowel sounds good Neuro - Alert, awake and oriented x 3, non focal exam. Skin - Warm and dry. b/l BKA  Data Reviewed:      Latest Ref Rng & Units 02/24/2023    4:11 AM 02/23/2023    4:26 AM 02/22/2023    6:09 AM  CBC  WBC 4.0 - 10.5 K/uL 8.4  8.4  9.7   Hemoglobin 13.0 - 17.0 g/dL 16.1  09.6  04.5   Hematocrit 39.0 - 52.0 % 40.3  41.0  41.7   Platelets 150 - 400 K/uL 245  244  265       Latest Ref Rng & Units 02/24/2023    4:11 AM 02/23/2023    4:26 AM 02/22/2023    6:09 AM  BMP  Glucose 70 - 99 mg/dL 409  811  914   BUN 8 - 23 mg/dL 42  39  44   Creatinine 0.61 - 1.24 mg/dL 7.82  9.56  2.13   Sodium 135 - 145 mmol/L 137  139  138   Potassium 3.5 - 5.1 mmol/L 3.5  3.8  4.3   Chloride 98 - 111 mmol/L 101  101  106   CO2 22 - 32 mmol/L 25  29  22    Calcium 8.9 - 10.3 mg/dL 8.2  8.7  8.6    No results found.   Family Communication: Discussed with patient, he understands and agrees. All questions answereed.    Disposition: Status is: Inpatient Remains inpatient appropriate because: HD, SNF placement  Planned Discharge Destination:  Skilled nursing facility     Time spent: 37 minutes  Author: Marcelino Duster, MD 02/24/2023 4:45 PM Secure chat 7am to 7pm For on call review [email or web address].

## 2023-02-24 NOTE — TOC Progression Note (Signed)
Transition of Care [facility]) - Progression Note    Patient Details  Name: [name] MRN: [id number] Date of Birth: Dec 26, 1934  Transition of Care [facility]) CM/SW Contact  Darolyn Rua, [place] Phone Number: 02/24/2023, 10:43 AM  Clinical Narrative:     [facility], insurance auth started. Pending auth at this time. Tiffany at [facility] aware.   Expected Discharge Plan: Assisted Living (with home health therapy) Barriers to Discharge: Continued Medical Work up  Expected Discharge Plan and Services       Living arrangements for the past 2 months: Assisted Living Facility                                       Social Determinants of Health (SDOH) Interventions SDOH Screenings   Food Insecurity: No Food Insecurity (08/15/2022)  Housing: Low Risk  (08/15/2022)  Transportation Needs: No Transportation Needs (08/15/2022)  Utilities: Not At Risk (08/15/2022)  Financial Resource Strain: Low Risk  (01/31/2018)   Received from [facility], [facility]  Tobacco Use: Medium Risk (01/18/2023)   Received from [facility]    Readmission Risk Interventions    02/18/2023   10:00 AM  Readmission Risk Prevention Plan  Transportation Screening Complete  PCP or Specialist Appt within 3-5 Days Complete  HRI or Home Care Consult Complete  Social Work Consult for Recovery Care Planning/Counseling Complete  Palliative Care Screening Not Applicable  Medication Review Oceanographer) Complete

## 2023-02-25 DIAGNOSIS — I5023 Acute on chronic systolic (congestive) heart failure: Secondary | ICD-10-CM | POA: Diagnosis not present

## 2023-02-25 DIAGNOSIS — I214 Non-ST elevation (NSTEMI) myocardial infarction: Secondary | ICD-10-CM

## 2023-02-25 DIAGNOSIS — J9601 Acute respiratory failure with hypoxia: Secondary | ICD-10-CM | POA: Diagnosis not present

## 2023-02-25 DIAGNOSIS — R9431 Abnormal electrocardiogram [ECG] [EKG]: Secondary | ICD-10-CM

## 2023-02-25 DIAGNOSIS — N1831 Chronic kidney disease, stage 3a: Secondary | ICD-10-CM | POA: Diagnosis not present

## 2023-02-25 DIAGNOSIS — E1122 Type 2 diabetes mellitus with diabetic chronic kidney disease: Secondary | ICD-10-CM | POA: Diagnosis not present

## 2023-02-25 LAB — BASIC METABOLIC PANEL
Anion gap: 11 (ref 5–15)
BUN: 48 mg/dL — ABNORMAL HIGH (ref 8–23)
CO2: 27 mmol/L (ref 22–32)
Calcium: 7.9 mg/dL — ABNORMAL LOW (ref 8.9–10.3)
Chloride: 99 mmol/L (ref 98–111)
Creatinine, Ser: 2.01 mg/dL — ABNORMAL HIGH (ref 0.61–1.24)
GFR, Estimated: 32 mL/min — ABNORMAL LOW (ref >60)
Glucose, Bld: 158 mg/dL — ABNORMAL HIGH (ref 70–99)
Potassium: 3.7 mmol/L (ref 3.5–5.1)
Sodium: 137 mmol/L (ref 135–145)

## 2023-02-25 LAB — CBC
HCT: 38 % — ABNORMAL LOW (ref 39.0–52.0)
Hemoglobin: 12.8 g/dL — ABNORMAL LOW (ref 13.0–17.0)
MCH: 30.1 pg (ref 26.0–34.0)
MCHC: 33.7 g/dL (ref 30.0–36.0)
MCV: 89.4 fL (ref 80.0–100.0)
Platelets: 232 10*3/uL (ref 150–400)
RBC: 4.25 MIL/uL (ref 4.22–5.81)
RDW: 14.7 % (ref 11.5–15.5)
WBC: 8.2 10*3/uL (ref 4.0–10.5)
nRBC: 0 % (ref 0.0–0.2)

## 2023-02-25 LAB — GLUCOSE, CAPILLARY
Glucose-Capillary: 140 mg/dL — ABNORMAL HIGH (ref 70–99)
Glucose-Capillary: 157 mg/dL — ABNORMAL HIGH (ref 70–99)

## 2023-02-25 MED ORDER — METOPROLOL SUCCINATE ER 25 MG PO TB24: 75.0000 mg | ORAL_TABLET | Freq: Two times a day (BID) | ORAL | 2 refills | Status: DC

## 2023-02-25 MED ORDER — MELATONIN 5 MG PO TABS: 5.0000 mg | ORAL_TABLET | Freq: Every evening | ORAL | 0 refills | Status: DC | PRN

## 2023-02-25 MED ORDER — INSULIN ASPART 100 UNIT/ML IJ SOLN: 0.0000 [IU] | Freq: Three times a day (TID) | INTRAMUSCULAR | 11 refills | Status: DC

## 2023-02-25 MED ORDER — NOVOLOG FLEXPEN 100 UNIT/ML ~~LOC~~ SOPN: 6.0000 [IU] | PEN_INJECTOR | Freq: Three times a day (TID) | SUBCUTANEOUS | Status: DC

## 2023-02-25 MED ORDER — LANTUS 100 UNIT/ML ~~LOC~~ SOLN: 10.0000 [IU] | Freq: Every day | SUBCUTANEOUS | Status: DC

## 2023-02-25 MED ORDER — BUMETANIDE 2 MG PO TABS: 2.0000 mg | ORAL_TABLET | Freq: Every day | ORAL | 2 refills | Status: DC

## 2023-02-25 NOTE — Discharge Summary (Signed)
Physician Discharge Summary   Patient: [name] MRN: [id number] DOB: [birth year]  Admit date:     02/17/2023  Discharge date: 02/25/23  Discharge Physician: Marcelino Duster   PCP: Luciana Axe, NP   Recommendations at discharge:    PCP follow up in 1 week. Cardiology follow up as scheduled.  Discharge Diagnoses: Principal Problem:   CHF exacerbation (HCC) Active Problems:   Acute on chronic systolic CHF (congestive heart failure) (HCC)   Acute respiratory failure with hypoxia (HCC)   Chronic kidney disease, stage 3a (HCC)   Type II diabetes mellitus with renal manifestations (HCC)   NSTEMI (non-ST elevated myocardial infarction) (HCC)   PAF (paroxysmal atrial fibrillation) (HCC)  Resolved Problems:   * No resolved hospital problems. [facility] Course: As per HPI- [name] is a 88 y.o. male with medical history significant of HFrEF, atrial fibrillation, stage III CKD, hypertension, hyperlipidemia, b/l BKA, type 2 diabetes presenting with acute respiratory failure with hypoxia, acute on chronic HFrEF.  Limited history in the setting of age and dementia.  History primarily from patient's son [name] ([id number]) who is also HCPOA.  Per report, patient with increased work of breathing cough over 1 to 2 days.  No reported fevers or chills.  No reported nausea or vomiting.  Per report, has been compliant with diuretic regimen at facility.  No reported chest pain.  No focal hemiparesis reported. Presented to the ER afebrile, hemodynamically stable.  Satting in the mid 80s on room air, transition to 2 L nasal cannula to keep O2 sats greater than 94%.  White count 9.5, hemoglobin 12.6, platelets 247, troponin 1 11-1 16, COVID flu and RSV negative, creatinine 1.9.  BNP 1600.  Chest x-ray with cardiomegaly and interstitial pulmonary edema admitted to hospitalist service for further management.  Assessment and Plan: Acute on chronic systolic CHF (congestive  heart failure)  2D echo March 2024 with EF of 35-40% as well as moderate mitral regurgitation Positive acute volume overload with noted BNP of 1600 as well as pulmonary edema chest x-ray S/p Lasix IV 40 mg x doses, s/p Lasix 40 mg IV bid on 9/28.  Currently on Bumex 2 mg daily which will be continued upon discharge with Cardiology follow up. He is off supplemental oxygen. Continue GDMT Continue Bumex 2 mg p.o. daily, Toprol succinate 75 mg p.o. twice daily.   Acute respiratory failure with hypoxia. Resolved  Decompensated respiratory status due to acute on chronic HFrEF, BNP 1600 with noted pulmonary edema on chest x-ray No overt infection noted. S/p IV Lasix as above On 9/27, started Brovana and budesonide nebulizer twice daily.  Respiratory failure resolved, currently patient is saturating well on room air. Discontinued Brovana and budesonide, transition to [employer] inhaler on 9/29, continue DuoNeb as needed  PAF (paroxysmal atrial fibrillation) Prolonged QTc Baseline atrial fibrillation Continue Xarelto, beta-blocker S/p sotalol 80 mg p.o. daily by cardiology on 9/29, discontinued on 10/1 due to QT prolongation. Serial EKGs showed improving QTc.  Continue metoprolol 75 mg p.o. twice daily increased from 50 mg twice daily dose on 10/1. Follow cardiology as scheduled.   Chronic kidney disease, stage 3a (HCC) Baseline creatinine 1.5-2.1 with GFR in the 30s to 40s stable. He was on Lasix. Continue Bumex therapy per Cardiology recommendations. Monitor renal function as outpatient with diuresis   Type II diabetes mellitus with renal manifestations (HCC) A1c 8.6. Continue Accu-Cheks, sliding scale as per full protocol. He was on Lantus 45 changed to 12 units  with NovoLog 6 units premeal. Hypoglycemia protocol. Novolog sliding scale.   Type 2 MI  Troponin 100s without active chest pain in setting demand ischemia due to acute on chronic HFrEF EKG stable, Likely secondary demand  ischemia Continue home regimen including baby aspirin Follow-up cardiology as scheduled outpatient    Dementia and depression,  continue Namenda, Prozac and Depakote home dose Continue supportive care   UTI prophylaxis,  Trimethoprim resumed.   Obesity with BMI 47.48 B/L BKA Diet, exercise and weight reduction advised. Supportive care. Fall precautions. Prosthesis bedside.      Consultants: Cardiology Procedures performed: none  Disposition: Assisted living Diet recommendation:  Discharge Diet Orders (From admission, onward)     Start     Ordered   02/25/23 0000  Diet - low sodium heart healthy        02/25/23 1055           Cardiac and Carb modified diet DISCHARGE MEDICATION: Allergies as of 02/25/2023       Reactions   Iodinated Contrast Media Shortness Of Breath, Anaphylaxis   SOB   Iohexol Shortness Of Breath    Desc: Respiratory Distress, Laryngedema, diaphoresis, Onset Date: [id number]   Metrizamide Shortness Of Breath   SOB SOB SOB   Latex Rash        Medication List     STOP taking these medications    furosemide 40 MG tablet Commonly known as: LASIX   metoprolol tartrate 25 MG tablet Commonly known as: LOPRESSOR       TAKE these medications    aspirin EC 81 MG tablet Take 81 mg by mouth daily.   atorvastatin 80 MG tablet Commonly known as: LIPITOR Take 80 mg by mouth at bedtime.   bisacodyl 5 MG EC tablet Commonly known as: DULCOLAX Take 5 mg by mouth daily as needed for moderate constipation.   bumetanide 2 MG tablet Commonly known as: BUMEX Take 1 tablet (2 mg total) by mouth daily. Start taking on: February 26, 2023   divalproex 500 MG DR tablet Commonly known as: DEPAKOTE Take 500 mg by mouth daily.   Ensure Max Protein Liqd Take 330 mLs (11 oz total) by mouth 2 (two) times daily. What changed: when to take this   ferrous sulfate 325 (65 FE) MG tablet Take 325 mg by mouth daily with breakfast.   FLUoxetine HCl 60  MG Tabs Take 1 tablet by mouth daily. What changed: Another medication with the same name was removed. Continue taking this medication, and follow the directions you see here.   Gemtesa 75 MG Tabs Generic drug: Vibegron Take 1 tablet (75 mg total) by mouth daily.   isosorbide mononitrate 30 MG 24 hr tablet Commonly known as: IMDUR Take 60 mg by mouth daily.   Lantus 100 UNIT/ML injection Generic drug: insulin glargine Inject 0.1 mLs (10 Units total) into the skin at bedtime. What changed:  how much to take Another medication with the same name was removed. Continue taking this medication, and follow the directions you see here.   melatonin 5 MG Tabs Take 1 tablet (5 mg total) by mouth at bedtime as needed.   memantine 10 MG tablet Commonly known as: NAMENDA Take 10 mg by mouth 2 (two) times daily.   metoprolol succinate 25 MG 24 hr tablet Commonly known as: TOPROL-XL Take 3 tablets (75 mg total) by mouth 2 (two) times daily.   multivitamin tablet Take 1 tablet by mouth daily.   nitroGLYCERIN 0.4 MG SL  tablet Commonly known as: NITROSTAT Place 1 tablet (0.4 mg total) under the tongue every 5 (five) minutes as needed for chest pain.   NovoLOG FlexPen 100 UNIT/ML FlexPen Generic drug: insulin aspart Inject 6 Units into the skin 3 (three) times daily with meals. What changed:  how much to take additional instructions   insulin aspart 100 UNIT/ML injection Commonly known as: novoLOG Inject 0-9 Units into the skin 3 (three) times daily with meals. CBG 70 - 120: 0 units CBG 121 - 150: 1 unit CBG 151 - 200: 2 units CBG 201 - 250: 3 units CBG 251 - 300: 5 units CBG 301 - 350: 7 units CBG 351 - 400: 9 units CBG > 400: call MD and obtain STAT lab verification What changed: You were already taking a medication with the same name, and this prescription was added. Make sure you understand how and when to take each.   polycarbophil 625 MG tablet Commonly known as: FIBERCON Take  625 mg by mouth daily.   Rivaroxaban 15 MG Tabs tablet Commonly known as: XARELTO Take 1 tablet (15 mg total) by mouth daily with supper. Hold Xarelto for 3 days.  Start on January 09, 2019.   trimethoprim 100 MG tablet Commonly known as: TRIMPEX Take 100 mg by mouth daily.        Follow-up Information     Germaine Pomfret, Eliane Decree, NP. Go in 1 week(s).   Specialty: Nurse Practitioner Contact information: [address] [place] [address] [phone]                Discharge Exam: Ceasar Mons Weights   02/17/23 4696 02/21/23 0740  Weight: 97.2 kg 86 kg   General - Elderly obese Caucasian male, no apparent distress HEENT - PERRLA, EOMI, atraumatic head, non tender sinuses. Lung - Clear, bibasilar Rales. Heart - S1, S2 heard, no murmurs, rubs, trace edema over bilateral stumps Abdomen - Soft, non tender non distended, bowel sounds good Neuro - Alert, awake and oriented x 3, non focal exam. Skin - Warm and dry. b/l BKA  Condition at discharge: stable  The results of significant diagnostics from this hospitalization (including imaging, microbiology, ancillary and laboratory) are listed below for reference.   Imaging Studies: DG Chest Port 1 View  Result Date: 02/17/2023 CLINICAL DATA:  Right chest pain and shortness of breath EXAM: PORTABLE CHEST 1 VIEW COMPARISON:  Chest radiograph 08/14/2022 FINDINGS: The heart is enlarged.  The upper mediastinal contours are normal. There is vascular congestion with possible mild pulmonary interstitial edema. There is no focal airspace consolidation. There is no pleural effusion or pneumothorax There is no acute osseous abnormality. IMPRESSION: Cardiomegaly with vascular congestion and possible mild pulmonary interstitial edema. Electronically Signed   By: Lesia Hausen M.D.   On: 02/17/2023 09:33    Microbiology: Results for orders placed or performed during the hospital encounter of 02/17/23  Resp panel by RT-PCR (RSV, Flu  A&B, Covid) Anterior Nasal Swab     Status: None   Collection Time: 02/17/23  8:14 AM   Specimen: Anterior Nasal Swab  Result Value Ref Range Status   SARS Coronavirus 2 by RT PCR NEGATIVE NEGATIVE Final    Comment: (NOTE) SARS-CoV-2 target nucleic acids are NOT DETECTED.  The SARS-CoV-2 RNA is generally detectable in upper respiratory specimens during the acute phase of infection. The lowest concentration of SARS-CoV-2 viral copies this assay can detect is 138 copies/mL. A negative result does not preclude SARS-Cov-2 infection and should  not be used as the sole basis for treatment or other patient management decisions. A negative result may occur with  improper specimen collection/handling, submission of specimen other than nasopharyngeal swab, presence of viral mutation(s) within the areas targeted by this assay, and inadequate number of viral copies(<138 copies/mL). A negative result must be combined with clinical observations, patient history, and epidemiological information. The expected result is Negative.  Fact Sheet for Patients:  BloggerCourse.com  Fact Sheet for Healthcare Providers:  SeriousBroker.it  This test is no t yet approved or cleared by the [place] FDA and  has been authorized for detection and/or diagnosis of SARS-CoV-2 by FDA under an Emergency Use Authorization (EUA). This EUA will remain  in effect (meaning this test can be used) for the duration of the COVID-19 declaration under Section 564(b)(1) of the Act, 21 U.S.C.section 360bbb-3(b)(1), unless the authorization is terminated  or revoked sooner.       Influenza A by PCR NEGATIVE NEGATIVE Final   Influenza B by PCR NEGATIVE NEGATIVE Final    Comment: (NOTE) The Xpert Xpress SARS-CoV-2/FLU/RSV plus assay is intended as an aid in the diagnosis of influenza from Nasopharyngeal swab specimens and should not be used as a sole basis for treatment.  Nasal washings and aspirates are unacceptable for Xpert Xpress SARS-CoV-2/FLU/RSV testing.  Fact Sheet for Patients: BloggerCourse.com  Fact Sheet for Healthcare Providers: SeriousBroker.it  This test is not yet approved or cleared by the [place] FDA and has been authorized for detection and/or diagnosis of SARS-CoV-2 by FDA under an Emergency Use Authorization (EUA). This EUA will remain in effect (meaning this test can be used) for the duration of the COVID-19 declaration under Section 564(b)(1) of the Act, 21 U.S.C. section 360bbb-3(b)(1), unless the authorization is terminated or revoked.     Resp Syncytial Virus by PCR NEGATIVE NEGATIVE Final    Comment: (NOTE) Fact Sheet for Patients: BloggerCourse.com  Fact Sheet for Healthcare Providers: SeriousBroker.it  This test is not yet approved or cleared by the [place] FDA and has been authorized for detection and/or diagnosis of SARS-CoV-2 by FDA under an Emergency Use Authorization (EUA). This EUA will remain in effect (meaning this test can be used) for the duration of the COVID-19 declaration under Section 564(b)(1) of the Act, 21 U.S.C. section 360bbb-3(b)(1), unless the authorization is terminated or revoked.  Performed at [facility], [address] [place], [place] [address]     Labs: CBC: Recent Labs  Lab 02/21/23 [id number] 02/22/23 0609 02/23/23 0426 02/24/23 0411 02/25/23 0258  WBC 10.3 9.7 8.4 8.4 8.2  HGB 13.7 13.8 14.0 13.6 12.8*  HCT 39.9 41.7 41.0 40.3 38.0*  MCV 88.3 88.5 86.9 87.2 89.4  PLT 253 265 244 245 232   Basic Metabolic Panel: Recent Labs  Lab 02/20/23 0407 02/20/23 1921 02/21/23 0307 02/22/23 0609 02/23/23 0426 02/24/23 0411 02/25/23 0258  NA 140   < > 141 138 139 137 137  K 3.7   < > 5.0 4.3 3.8 3.5 3.7  CL 102   < > 105 106 101 101 99  CO2 25   < > 22 22 29  25 27   GLUCOSE 165*   < > 189* 158* 121* 153* 158*  BUN 43*   < > 54* 44* 39* 42* 48*  CREATININE 2.01*   < > 2.06* 1.59* 1.64* 1.79* 2.01*  CALCIUM 8.6*   < > 8.7* 8.6* 8.7* 8.2* 7.9*  MG 2.1  --  2.2 2.4 2.3  --   --  PHOS 4.5  --   --   --   --   --   --    < > = values in this interval not displayed.   Liver Function Tests: No results for input(s): "AST", "ALT", "ALKPHOS", "BILITOT", "PROT", "ALBUMIN" in the last 168 hours. CBG: Recent Labs  Lab 02/24/23 0933 02/24/23 1140 02/24/23 1711 02/24/23 2119 02/25/23 0740  GLUCAP 123* 139* 208* 259* 140*    Discharge time spent: 38 minutes.  Signed: Marcelino Duster, MD [facility] 02/25/2023

## 2023-02-25 NOTE — TOC Transition Note (Addendum)
Transition of Care [facility]) - CM/SW Discharge Note   Patient Details  Name: [name] MRN: [id number] Date of Birth: Apr 17, 1935  Transition of Care [facility]) CM/SW Contact:  Liliana Cline, LCSW Phone Number: 02/25/2023, 11:20 AM   Clinical Narrative:    Discharge to [facility] today. Room 507. Confirmed with Admissions Worker Tiffany. Updated MD, RN, and son [name]). Asked RN to call report. EMS paperwork completed. [facility] will be called when patient is ready for transport.  1:12- [facility] called for transport. Patient is next on the list. RN notified.     Final next level of care: Skilled Nursing Facility Barriers to Discharge: Barriers Resolved   Patient Goals and CMS Choice      Discharge Placement                Patient chooses bed at: [facility] Patient to be transferred to facility by: [facility] Name of family member notified: [name] - son Patient and family notified of of transfer: 02/25/23  Discharge Plan and Services Additional resources added to the After Visit Summary for                                       Social Determinants of Health (SDOH) Interventions SDOH Screenings   Food Insecurity: No Food Insecurity (08/15/2022)  Housing: Low Risk  (08/15/2022)  Transportation Needs: No Transportation Needs (08/15/2022)  Utilities: Not At Risk (08/15/2022)  Financial Resource Strain: Low Risk  (01/31/2018)   Received from [facility], [facility]  Tobacco Use: Medium Risk (01/18/2023)   Received from [facility]     Readmission Risk Interventions    02/18/2023   10:00 AM  Readmission Risk Prevention Plan  Transportation Screening Complete  PCP or Specialist Appt within 3-5 Days Complete  HRI or Home Care Consult Complete  Social Work Consult for Recovery Care Planning/Counseling Complete  Palliative Care Screening Not Applicable  Medication Review Furniture conservator/restorer) Complete

## 2023-02-25 NOTE — Plan of Care (Signed)
  Problem: Fluid Volume: Goal: Ability to maintain a balanced intake and output will improve Outcome: Progressing   Problem: Coping: Goal: Ability to adjust to condition or change in health will improve Outcome: Progressing   Problem: Nutritional: Goal: Maintenance of adequate nutrition will improve Outcome: Progressing   Problem: Clinical Measurements: Goal: Respiratory complications will improve Outcome: Progressing   Problem: Clinical Measurements: Goal: Cardiovascular complication will be avoided Outcome: Progressing   Problem: Activity: Goal: Capacity to carry out activities will improve Outcome: Progressing

## 2023-02-25 NOTE — TOC Progression Note (Signed)
Transition of Care [facility]) - Progression Note    Patient Details  Name: [name] MRN: [id number] Date of Birth: January 24, 1935  Transition of Care [facility]) CM/SW Contact  Liliana Cline, LCSW Phone Number: 02/25/2023, 8:42 AM  Clinical Narrative:    Berkley Harvey has been approved in [facility]. Per Elmarie Shiley, he can come today if medically ready. Asked MD.   Expected Discharge Plan: Assisted Living (with home health therapy) Barriers to Discharge: Continued Medical Work up  Expected Discharge Plan and Services       Living arrangements for the past 2 months: Assisted Living Facility                                       Social Determinants of Health (SDOH) Interventions SDOH Screenings   Food Insecurity: No Food Insecurity (08/15/2022)  Housing: Low Risk  (08/15/2022)  Transportation Needs: No Transportation Needs (08/15/2022)  Utilities: Not At Risk (08/15/2022)  Financial Resource Strain: Low Risk  (01/31/2018)   Received from [facility], [facility]  Tobacco Use: Medium Risk (01/18/2023)   Received from [facility]    Readmission Risk Interventions    02/18/2023   10:00 AM  Readmission Risk Prevention Plan  Transportation Screening Complete  PCP or Specialist Appt within 3-5 Days Complete  HRI or Home Care Consult Complete  Social Work Consult for Recovery Care Planning/Counseling Complete  Palliative Care Screening Not Applicable  Medication Review Oceanographer) Complete

## 2023-02-25 NOTE — Progress Notes (Signed)
Physical Therapy Treatment Patient Details Name: Hero Ainsley MRN: [id number] DOB: 1935/02/15 Today's Date: 02/25/2023   History of Present Illness Desmen Goetter is a 88 y.o. male with medical history significant of HFrEF, atrial fibrillation, stage III CKD, hypertension, hyperlipidemia, type 2 diabetes presenting with acute respiratory failure with hypoxia, acute on chronic HFrEF. Chest x-ray with cardiomegaly and interstitial pulmonary edema.    PT Comments  Pt was pleasant and motivated to participate during the session and put forth good effort throughout. Pt seated in recliner upon entry with eyes close. Easily able to wake with verbal call out. Pt is Max A +1, or Mod +2 for STS transfers from recliner, cues provided for upright posture once up. Pt able to perform 8 feet x3 amb bouts with RW overall CGA. No imbalance noted or SOB. Pt's SpO2 and HR remained WFL when good pleth present. Pt will benefit from continued PT services upon discharge to safely address deficits listed in patient problem list for decreased caregiver assistance and eventual return to PLOF.    If plan is discharge home, recommend the following: A little help with walking and/or transfers;A little help with bathing/dressing/bathroom;Assistance with cooking/housework;Help with stairs or ramp for entrance;Assist for transportation;Supervision due to cognitive status   Can travel by private vehicle     No  Equipment Recommendations       Recommendations for Other Services       Precautions / Restrictions Precautions Precautions: Fall Precaution Comments: B BKA, wears prosthesis Required Braces or Orthoses: Other Brace Other Brace: B prosthesis Restrictions Weight Bearing Restrictions: No Other Position/Activity Restrictions: has B prostheses     Mobility  Bed Mobility               General bed mobility comments: pt in recliner at start/end of session    Transfers Overall transfer  level: Needs assistance Equipment used: Rolling walker (2 wheels) Transfers: Sit to/from Stand, Bed to chair/wheelchair/BSC Sit to Stand: From elevated surface, Max assist (or Mod A +2)           General transfer comment: VC for hand placement    Ambulation/Gait Ambulation/Gait assistance: Contact guard assist Gait Distance (Feet): 8 Feet x3 Assistive device: Rolling walker (2 wheels) Gait Pattern/deviations: Step-through pattern, Trunk flexed, Narrow base of support, Decreased step length - right, Decreased step length - left, Decreased stride length Gait velocity: decreased     General Gait Details: Pt CGA overall amb, able to perform 3 separate bouts with 1-2 min rest breaks, HR and O2 stable when good pleth available. no SOB or imbalance noted. provided consistent cues for upright posture throughout bouts.   Stairs             Wheelchair Mobility     Tilt Bed    Modified Rankin (Stroke Patients Only)       Balance Overall balance assessment: Needs assistance Sitting-balance support: No upper extremity supported, Feet supported, Feet unsupported Sitting balance-Leahy Scale: Good     Standing balance support: Bilateral upper extremity supported, During functional activity, Reliant on assistive device for balance Standing balance-Leahy Scale: Fair Standing balance comment: static standing at  [employer] Arousal: Alert Behavior During Therapy: [facility] for tasks assessed/performed Overall Cognitive Status: History of cognitive impairments - at baseline  General Comments: Pt had eyes closed upon entry, easily able to wake with verbal call out        Exercises      General Comments        Pertinent Vitals/Pain Pain Assessment Pain Assessment: No/denies pain    Home Living                          Prior Function            PT Goals (current goals can  now be found in the care plan section) Progress towards PT goals: Progressing toward goals    Frequency    Min 1X/week      PT Plan      Co-evaluation              AM-PAC PT "6 Clicks" Mobility   Outcome Measure  Help needed turning from your back to your side while in a flat bed without using bedrails?: A Little Help needed moving from lying on your back to sitting on the side of a flat bed without using bedrails?: A Little Help needed moving to and from a bed to a chair (including a wheelchair)?: A Little Help needed standing up from a chair using your arms (e.g., wheelchair or bedside chair)?: A Lot Help needed to walk in hospital room?: A Little Help needed climbing 3-5 steps with a railing? : A Lot 6 Click Score: 16    End of Session Equipment Utilized During Treatment: Gait belt Activity Tolerance: Patient limited by fatigue;Patient tolerated treatment well Patient left: in chair;with call bell/phone within reach;with chair alarm set Nurse Communication: Mobility status PT Visit Diagnosis: Unsteadiness on feet (R26.81);Muscle weakness (generalized) (M62.81)     Time: 1040-1100 PT Time Calculation (min) (ACUTE ONLY): 20 min  Charges:                            Cecile Sheerer, SPT 02/25/23, 1:45 PM

## 2023-02-25 NOTE — Progress Notes (Signed)
Occupational Therapy Treatment Patient Details Name: [name] MRN: [id number] DOB: April 28, 1935 Today's Date: 02/25/2023   History of present illness [name] is a 88 y.o. male with medical history significant of HFrEF, atrial fibrillation, stage III CKD, hypertension, hyperlipidemia, type 2 diabetes presenting with acute respiratory failure with hypoxia, acute on chronic HFrEF. Chest x-ray with cardiomegaly and interstitial pulmonary edema.   OT comments  Pt seen for OT tx. Pt pleasant, alert, and agreeable to session. Pt required CGA and increased time/effort with use of bed rails to complete sup>sit EOB. VC for scooting forward to support balance and access while donning prostheses. Pt required MIN A for donning and increased time to sequence and complete. Pt required bed to be elevated a few inches and MAX A to come to standing with VC for hand placement. Once in standing though pt only required MIN A for step pivot to the recliner. Set up for grooming tasks. Pt demonstrating progress towards goals. Continues to benefit from skilled OT services.       If plan is discharge home, recommend the following:  A lot of help with walking and/or transfers;A lot of help with bathing/dressing/bathroom;Assistance with cooking/housework;Direct supervision/assist for medications management;Direct supervision/assist for financial management;Assist for transportation;Supervision due to cognitive status   Equipment Recommendations  None recommended by OT    Recommendations for Other Services Other (comment)    Precautions / Restrictions Precautions Precautions: Fall Precaution Comments: B BKA, wears prosthesis Restrictions Weight Bearing Restrictions: No Other Position/Activity Restrictions: has B prostheses       Mobility Bed Mobility Overal bed mobility: Needs Assistance Bed Mobility: Supine to Sit     Supine to sit: Contact guard, HOB elevated, Used rails      General bed mobility comments: incr time/effort    Transfers Overall transfer level: Needs assistance Equipment used: Rolling walker (2 wheels) Transfers: Sit to/from Stand, Bed to chair/wheelchair/BSC Sit to Stand: From elevated surface, Max assist     Step pivot transfers: Min assist     General transfer comment: VC for hand placement     Balance Overall balance assessment: Needs assistance Sitting-balance support: No upper extremity supported, Feet supported, Feet unsupported Sitting balance-Leahy Scale: Good Sitting balance - Comments: Pt able to maintain seated EOB static and dynamic balance during donning of B protheses with no LOB noted     Standing balance-Leahy Scale: Fair                             ADL either performed or assessed with clinical judgement   ADL Overall ADL's : Needs assistance/impaired     Grooming: Sitting;Set up;Wash/dry face               Lower Body Dressing: Sit to/from stand;Minimal assistance Lower Body Dressing Details (indicate cue type and reason): MIN A for donning prostheses             Functional mobility during ADLs: Minimal assistance;Rolling walker (2 wheels)      Extremity/Trunk Assessment              Vision       Perception     Praxis      Cognition Arousal: Alert Behavior During Therapy: WFL for tasks assessed/performed Overall Cognitive Status: History of cognitive impairments - at baseline  Exercises      Shoulder Instructions       General Comments      Pertinent Vitals/ Pain       Pain Assessment Pain Assessment: No/denies pain  Home Living                                          Prior Functioning/Environment              Frequency  Min 1X/week        Progress Toward Goals  OT Goals(current goals can now be found in the care plan section)  Progress towards OT goals: Progressing  toward goals  Acute Rehab OT Goals OT Goal Formulation: With patient Time For Goal Achievement: 03/08/23 Potential to Achieve Goals: Good  Plan      Co-evaluation                 AM-PAC OT "6 Clicks" Daily Activity     Outcome Measure   Help from another person eating meals?: None Help from another person taking care of personal grooming?: A Little Help from another person toileting, which includes using toliet, bedpan, or urinal?: A Lot Help from another person bathing (including washing, rinsing, drying)?: A Lot Help from another person to put on and taking off regular upper body clothing?: A Little Help from another person to put on and taking off regular lower body clothing?: A Lot 6 Click Score: 16    End of Session Equipment Utilized During Treatment: Rolling walker (2 wheels);Gait belt  OT Visit Diagnosis: Muscle weakness (generalized) (M62.81);Unsteadiness on feet (R26.81);Other abnormalities of gait and mobility (R26.89)   Activity Tolerance Patient tolerated treatment well   Patient Left in chair;with call bell/phone within reach;with chair alarm set   Nurse Communication Other (comment) (unit secretary - needs new phone to call wife)        Time: 1008-1030 OT Time Calculation (min): 22 min  Charges: OT General Charges $OT Visit: 1 Visit OT Treatments $Self Care/Home Management : 8-22 mins  Arman Filter., MPH, MS, OTR/L ascom [phone] 02/25/23, 10:44 AM

## 2023-02-28 ENCOUNTER — Encounter: Payer: Medicare Other | Admitting: Family

## 2023-02-28 NOTE — Progress Notes (Deleted)
PCP: Primary Cardiologist:  HPI:  Primary cardiologist: Gwen Pounds (last seen 09/23) PCP: Rolan Lipa (last seen 03/24) Neurologist: Marlene Bast (last seen 03/24)  HPI  [name] is a 88 y/o male with a history of atrial fibrillation, prostate cancer, CKD, DM, hyperlipidemia, HTN, PVD, obstructive sleep apnea (CPAP), gangrene, previous tobacco use and chronic heart failure.  Echo 08/15/22: EF 35-40% along with mild/ mod LAE and mild/ mod Jesse. Echo 06/03/2018: EF of 45-50%.  Echo 11/24/17: EF of 40% along with mild Jesse and a PA pressure of 58 mm Hg. Echo 05/19/16: EF of 60-65% along with moderate Jesse.   LHC 06/25/19:  Ost LAD lesion is 100% stenosed. Ost Cx lesion is 30% stenosed. Prox Cx lesion is 30% stenosed. Mid Cx lesion is 30% stenosed. Ost RCA to Prox RCA lesion is 50% stenosed.  1.  One-vessel CAD with chronically occluded ostial LAD, unchanged compared to prior cardiac catheterization 06/05/2018 2.  Mild to moderate reduced left ventricular function with estimated LV ejection fraction 35 to 40%  Admitted 08/14/22 due to progressively worsening shortness of breath, palpitations and hypoxia due to HF exacerbation likely due to AF RVR. Initially needed nonrebreather at 15L. IV diuresed. Cardizem gtt started with transition to oral meds.    He presents today for a HF follow-up visit although hasn't been seen since 12/2018. He presents with a chief complaint of moderate fatigue with little exertion. Chronic in nature. Has associated intermittent wheezing and weakness along with this. Denies difficulty sleeping, dizziness, abdominal distention, palpitations, pedal edema, chest pain, SOB or cough.      ROS: All systems negative except as listed in HPI, PMH and Problem List.  SH:  Social History   Socioeconomic History   Marital status: Married    Spouse name: Not on file   Number of children: Not on file   Years of education: Not on file   Highest education level: Not on file  Occupational  History   Occupation: retired  Tobacco Use   Smoking status: Former    Current packs/day: 0.00    Types: Cigarettes    Quit date: 10/14/1994    Years since quitting: 28.3    Passive exposure: Past   Smokeless tobacco: Never  Vaping Use   Vaping status: Never Used  Substance and Sexual Activity   Alcohol use: No    Alcohol/week: 0.0 standard drinks of alcohol   Drug use: No   Sexual activity: Not on file  Other Topics Concern   Not on file  Social History Narrative   Lives at home with wife, has a wheelchair   Social Determinants of Health   Financial Resource Strain: Low Risk  (01/31/2018)   Received from [facility]   Overall Financial Resource Strain (CARDIA)    Difficulty of Paying Living Expenses: Not very hard  Food Insecurity: No Food Insecurity (08/15/2022)   Hunger Vital Sign    Worried About Running Out of Food in the Last Year: Never true    Ran Out of Food in the Last Year: Never true  Transportation Needs: No Transportation Needs (08/15/2022)   PRAPARE - Administrator, Civil Service (Medical): No    Lack of Transportation (Non-Medical): No  Physical Activity: Not on file  Stress: Not on file  Social Connections: Not on file  Intimate Partner Violence: Unknown (08/15/2022)   Humiliation, Afraid, Rape, and Kick questionnaire    Fear of Current or Ex-Partner: No  Emotionally Abused: No    Physically Abused: Not on file    Sexually Abused: No    FH:  Family History  Problem Relation Age of Onset   Hypertension Mother    Diabetes Mother    Prostate cancer Neg Hx    Bladder Cancer Neg Hx    Kidney disease Neg Hx     Past Medical History:  Diagnosis Date   Acute respiratory failure with hypoxia (HCC) 02/08/2019   Arthritis    Atrial fibrillation (HCC)    Carcinoma of prostate (HCC)    CHF (congestive heart failure) (HCC)    Chronic kidney disease    Diabetes mellitus without complication  (HCC)    ED (erectile dysfunction)    Frequent urination    Hyperlipidemia    Hypertension    Leukocytosis 09/06/2016   Moderate mitral insufficiency    Myocardial injury 08/14/2022   Peripheral vascular disease (HCC)    Pneumonia 04/2016   Prostate cancer (HCC)    Sleep apnea    OSA--USE C-PAP   Ulcer of left foot due to type 2 diabetes mellitus (HCC)    Urinary stress incontinence, male     Current Outpatient Medications  Medication Sig Dispense Refill   aspirin 81 MG EC tablet Take 81 mg by mouth daily.     atorvastatin (LIPITOR) 80 MG tablet Take 80 mg by mouth at bedtime.     bisacodyl (DULCOLAX) 5 MG EC tablet Take 5 mg by mouth daily as needed for moderate constipation.     bumetanide (BUMEX) 2 MG tablet Take 1 tablet (2 mg total) by mouth daily. 30 tablet 2   divalproex (DEPAKOTE) 500 MG DR tablet Take 500 mg by mouth daily.      Ensure Max Protein (ENSURE MAX PROTEIN) LIQD Take 330 mLs (11 oz total) by mouth 2 (two) times daily. (Patient taking differently: Take 11 oz by mouth daily.)     ferrous sulfate 325 (65 FE) MG tablet Take 325 mg by mouth daily with breakfast.     FLUoxetine HCl 60 MG TABS Take 1 tablet by mouth daily.     insulin aspart (NOVOLOG) 100 UNIT/ML injection Inject 0-9 Units into the skin 3 (three) times daily with meals. CBG 70 - 120: 0 units CBG 121 - 150: 1 unit CBG 151 - 200: 2 units CBG 201 - 250: 3 units CBG 251 - 300: 5 units CBG 301 - 350: 7 units CBG 351 - 400: 9 units CBG > 400: call MD and obtain STAT lab verification 10 mL 11   isosorbide mononitrate (IMDUR) 30 MG 24 hr tablet Take 60 mg by mouth daily.     LANTUS 100 UNIT/ML injection Inject 0.1 mLs (10 Units total) into the skin at bedtime.     melatonin 5 MG TABS Take 1 tablet (5 mg total) by mouth at bedtime as needed. 30 tablet 0   memantine (NAMENDA) 10 MG tablet Take 10 mg by mouth 2 (two) times daily.     metoprolol succinate (TOPROL-XL) 25 MG 24 hr tablet Take 3 tablets (75 mg total) by  mouth 2 (two) times daily. 180 tablet 2   Multiple Vitamin (MULTIVITAMIN) tablet Take 1 tablet by mouth daily.     nitroGLYCERIN (NITROSTAT) 0.4 MG SL tablet Place 1 tablet (0.4 mg total) under the tongue every 5 (five) minutes as needed for chest pain. 20 tablet 12   NOVOLOG FLEXPEN 100 UNIT/ML FlexPen Inject 6 Units into the skin 3 (three)  times daily with meals.     polycarbophil (FIBERCON) 625 MG tablet Take 625 mg by mouth daily. (Patient not taking: Reported on 02/17/2023)     Rivaroxaban (XARELTO) 15 MG TABS tablet Take 1 tablet (15 mg total) by mouth daily with supper. Hold Xarelto for 3 days.  Start on January 09, 2019. 30 tablet 1   trimethoprim (TRIMPEX) 100 MG tablet Take 100 mg by mouth daily.     Vibegron (GEMTESA) 75 MG TABS Take 1 tablet (75 mg total) by mouth daily. 30 tablet 11   No current facility-administered medications for this visit.    There were no vitals filed for this visit.  PHYSICAL EXAM:  General:  Well appearing. No resp difficulty HEENT: normal Neck: supple. JVP flat. Carotids 2+ bilaterally; no bruits. No lymphadenopathy or thryomegaly appreciated. Cor: PMI normal. Regular rate & rhythm. No rubs, gallops or murmurs. Lungs: clear Abdomen: soft, nontender, nondistended. No hepatosplenomegaly. No bruits or masses. Good bowel sounds. Extremities: no cyanosis, clubbing, rash, edema Neuro: alert & orientedx3, cranial nerves grossly intact. Moves all 4 extremities w/o difficulty. Affect pleasant.   ECG:   ASSESSMENT & PLAN:  1: Chronic heart failure with reduced ejection fraction- - NYHA class III - euvolemic - weight 199.6 pounds today - not adding salt to his food - saw cardiologist Gwen Pounds) 02/02/22; getting established with one of his partners 08/31/22 - continue furosemide 40mg  daily - continue metoprolol tartrate 50mg  BID - consider SGLT2 if renal function remains stable - BNP 08/14/22 was 896.7  2: HTN- - BP 138/78 - saw PCP Rolan Lipa) 07/27/22 -  BMP done 08/17/22 reviewed and shows a sodium 139, potassium 4.2, creatinine 2.07 and GFR of 31  3: Diabetes- - s/p bilateral BKA's - A1c done 07/27/22 was 8.8% - having home PT twice/ week   Due to HF stability, will not make a return appointment at this time. Follow closely with cardiology and return if needed.

## 2023-03-13 ENCOUNTER — Other Ambulatory Visit: Payer: Self-pay

## 2023-03-13 ENCOUNTER — Emergency Department
Admission: EM | Admit: 2023-03-13 | Discharge: 2023-03-13 | Disposition: A | Discharge status: Home / Self Care | Payer: Medicare Other | Attending: Emergency Medicine | Admitting: Emergency Medicine

## 2023-03-13 DIAGNOSIS — I110 Hypertensive heart disease with heart failure: Secondary | ICD-10-CM | POA: Insufficient documentation

## 2023-03-13 DIAGNOSIS — I509 Heart failure, unspecified: Secondary | ICD-10-CM | POA: Diagnosis not present

## 2023-03-13 DIAGNOSIS — R030 Elevated blood-pressure reading, without diagnosis of hypertension: Secondary | ICD-10-CM

## 2023-03-13 DIAGNOSIS — E1165 Type 2 diabetes mellitus with hyperglycemia: Secondary | ICD-10-CM | POA: Diagnosis not present

## 2023-03-13 DIAGNOSIS — D72829 Elevated white blood cell count, unspecified: Secondary | ICD-10-CM | POA: Insufficient documentation

## 2023-03-13 DIAGNOSIS — I2510 Atherosclerotic heart disease of native coronary artery without angina pectoris: Secondary | ICD-10-CM | POA: Insufficient documentation

## 2023-03-13 DIAGNOSIS — R531 Weakness: Secondary | ICD-10-CM | POA: Insufficient documentation

## 2023-03-13 LAB — CBC
HCT: 45 % (ref 39.0–52.0)
Hemoglobin: 14.5 g/dL (ref 13.0–17.0)
MCH: 29.3 pg (ref 26.0–34.0)
MCHC: 32.2 g/dL (ref 30.0–36.0)
MCV: 90.9 fL (ref 80.0–100.0)
Platelets: 249 10*3/uL (ref 150–400)
RBC: 4.95 MIL/uL (ref 4.22–5.81)
RDW: 14.3 % (ref 11.5–15.5)
WBC: 10.8 10*3/uL — ABNORMAL HIGH (ref 4.0–10.5)
nRBC: 0 % (ref 0.0–0.2)

## 2023-03-13 LAB — BASIC METABOLIC PANEL
Anion gap: 13 (ref 5–15)
BUN: 47 mg/dL — ABNORMAL HIGH (ref 8–23)
CO2: 25 mmol/L (ref 22–32)
Calcium: 8.4 mg/dL — ABNORMAL LOW (ref 8.9–10.3)
Chloride: 100 mmol/L (ref 98–111)
Creatinine, Ser: 2.19 mg/dL — ABNORMAL HIGH (ref 0.61–1.24)
GFR, Estimated: 29 mL/min — ABNORMAL LOW (ref >60)
Glucose, Bld: 226 mg/dL — ABNORMAL HIGH (ref 70–99)
Potassium: 3.9 mmol/L (ref 3.5–5.1)
Sodium: 138 mmol/L (ref 135–145)

## 2023-03-13 NOTE — ED Provider Notes (Signed)
[facility] Provider Note    Event Date/Time   First MD Initiated Contact with Patient 03/13/23 1730     (approximate)   History   Weakness   HPI  Roddie Sposato is a 88 year old male with history of T2DM, A-fib, CHF, bilateral BKA, CAD presenting to the emergency department for evaluation of elevated blood pressure.  Family reports that they were contacted by the [facility] earlier today because patient had an elevated blood pressure.  Unsure of exact measurement, thinks it may have been 170s over 110s.  Per EMS, patient refused to eat his lunch and was weaker than usual.  On my evaluation, patient denies any complaints.  Specifically denies headache, chest pain, shortness of breath, abdominal pain, dysuria, diarrhea.    Physical Exam   Triage Vital Signs: ED Triage Vitals  Encounter Vitals Group     BP 03/13/23 1317 100/70     Systolic BP Percentile --      Diastolic BP Percentile --      Pulse Rate 03/13/23 1317 95     Resp 03/13/23 1317 18     Temp 03/13/23 1317 97.6 F (36.4 C)     Temp src --      SpO2 03/13/23 1317 90 %     Weight --      Height --      Head Circumference --      Peak Flow --      Pain Score 03/13/23 1318 0     Pain Loc --      Pain Education --      Exclude from Growth Chart --     Most recent vital signs: Vitals:   03/13/23 1317 03/13/23 1703  BP: 100/70 102/77  Pulse: 95 86  Resp: 18 18  Temp: 97.6 F (36.4 C)   SpO2: 90% 94%     General: Awake, interactive  CV:  Irregularly irregular rhythm with variable heart rates around 90s Resp:  Lungs clear, unlabored respirations.  Abd:  Soft, nondistended.  Neuro:  Symmetric facial movement, fluid speech, 5 out of 5 strength in bilateral upper extremities, 5 out of 5 strength with hip flexion of bilateral lower extremities.   ED Results / Procedures / Treatments   Labs (all labs ordered are listed, but only abnormal results are displayed) Labs  Reviewed  BASIC METABOLIC PANEL - Abnormal; Notable for the following components:      Result Value   Glucose, Bld 226 (*)    BUN 47 (*)    Creatinine, Ser 2.19 (*)    Calcium 8.4 (*)    GFR, Estimated 29 (*)    All other components within normal limits  CBC - Abnormal; Notable for the following components:   WBC 10.8 (*)    All other components within normal limits  CBG MONITORING, ED     EKG EKG independently reviewed interpreted by myself (ER attending) demonstrates:  EKG demonstrates A-fib at a rate of 101, QRS 86, QTc 490, nonspecific ST changes  RADIOLOGY Imaging independently reviewed and interpreted by myself demonstrates:    PROCEDURES:  Critical Care performed: No  Procedures   MEDICATIONS ORDERED IN ED: Medications - No data to display   IMPRESSION / MDM / ASSESSMENT AND PLAN / ED COURSE  I reviewed the triage vital signs and the nursing notes.  Differential diagnosis includes, but is not limited to, transient hypertension secondary to acute pain, other stressor, blood pressure cuff malfunction, anemia,  electrolyte abnormality, BP much improved here, no evidence of hypertensive emergency  Patient's presentation is most consistent with acute presentation with potential threat to life or bodily function.  89 year old male presenting with weakness and elevated blood pressure at facility.  Blood pressure here improved.  Systolics of 120s at the time of my initial evaluation.  Labs from triage with mild leukocytosis, baseline creatinine elevation.  Mild hyperglycemia without evidence of DKA.  Patient currently without complaints.  He is at his neurologic baseline.  Patient and family are comfortable with discharge home.  Strict return precautions provided.  Patient discharged stable condition.      FINAL CLINICAL IMPRESSION(S) / ED DIAGNOSES   Final diagnoses:  Weakness  Elevated blood pressure reading     Rx / DC Orders   ED Discharge Orders     None         Note:  This document was prepared using Dragon voice recognition software and may include unintentional dictation errors.   Trinna Post, MD 03/13/23 [phone]

## 2023-03-13 NOTE — ED Notes (Signed)
Family states facility refusing to take pt back until he is seen by MD.pt denies any issues

## 2023-03-13 NOTE — ED Triage Notes (Signed)
Pt comes with c/o increased weakness per facility. Pt is from [facility]. Pt does have strong urine odor. Pt is alert to place and denies any complaints.

## 2023-03-13 NOTE — ED Triage Notes (Signed)
Pt in via EMS from [facility] with c/o weakness. EMS reports per nursing staff, pt refused to eat his lunch and was weaker than normal. Pt has no complaints but has hx of alzheimer's. Per EMS stroke screen negative. CBG 255, 98.1, 140/72, HR 97, 98% RA

## 2023-03-13 NOTE — Discharge Instructions (Signed)
You were seen in the ER today for evaluation of an elevated blood pressure reading and weakness.  Fortunately your blood pressure and weakness here are improved.  Your blood testing was overall reassuring.  Follow with your primary care doctor for further evaluation.  Return to the ER for new or worsening symptoms.

## 2023-03-13 NOTE — ED Notes (Signed)
Pt family states pt was here just bc his bp was elevated earlier this morning. Pt Bp not anymore  so family wants to take pt back to [place].

## 2023-03-20 ENCOUNTER — Other Ambulatory Visit: Payer: Self-pay

## 2023-03-20 ENCOUNTER — Inpatient Hospital Stay
Admission: EM | Admit: 2023-03-20 | Discharge: 2023-03-25 | DRG: 871 | Disposition: E | Payer: Medicare Other | Attending: Internal Medicine | Admitting: Internal Medicine

## 2023-03-20 ENCOUNTER — Emergency Department: Payer: Medicare Other

## 2023-03-20 ENCOUNTER — Encounter: Payer: Self-pay | Admitting: *Deleted

## 2023-03-20 DIAGNOSIS — E1151 Type 2 diabetes mellitus with diabetic peripheral angiopathy without gangrene: Secondary | ICD-10-CM | POA: Diagnosis present

## 2023-03-20 DIAGNOSIS — G9340 Encephalopathy, unspecified: Secondary | ICD-10-CM | POA: Diagnosis not present

## 2023-03-20 DIAGNOSIS — I10 Essential (primary) hypertension: Secondary | ICD-10-CM | POA: Diagnosis not present

## 2023-03-20 DIAGNOSIS — I4891 Unspecified atrial fibrillation: Secondary | ICD-10-CM | POA: Diagnosis not present

## 2023-03-20 DIAGNOSIS — I4819 Other persistent atrial fibrillation: Secondary | ICD-10-CM | POA: Diagnosis present

## 2023-03-20 DIAGNOSIS — R7401 Elevation of levels of liver transaminase levels: Secondary | ICD-10-CM | POA: Diagnosis present

## 2023-03-20 DIAGNOSIS — N200 Calculus of kidney: Secondary | ICD-10-CM | POA: Diagnosis present

## 2023-03-20 DIAGNOSIS — I5022 Chronic systolic (congestive) heart failure: Secondary | ICD-10-CM | POA: Diagnosis present

## 2023-03-20 DIAGNOSIS — Z66 Do not resuscitate: Secondary | ICD-10-CM | POA: Diagnosis present

## 2023-03-20 DIAGNOSIS — N179 Acute kidney failure, unspecified: Secondary | ICD-10-CM | POA: Diagnosis present

## 2023-03-20 DIAGNOSIS — E871 Hypo-osmolality and hyponatremia: Secondary | ICD-10-CM | POA: Diagnosis present

## 2023-03-20 DIAGNOSIS — Z96641 Presence of right artificial hip joint: Secondary | ICD-10-CM | POA: Diagnosis present

## 2023-03-20 DIAGNOSIS — Z1152 Encounter for screening for COVID-19: Secondary | ICD-10-CM | POA: Diagnosis not present

## 2023-03-20 DIAGNOSIS — Z89512 Acquired absence of left leg below knee: Secondary | ICD-10-CM

## 2023-03-20 DIAGNOSIS — Z515 Encounter for palliative care: Secondary | ICD-10-CM | POA: Diagnosis not present

## 2023-03-20 DIAGNOSIS — Z79890 Hormone replacement therapy (postmenopausal): Secondary | ICD-10-CM

## 2023-03-20 DIAGNOSIS — F0282 Dementia in other diseases classified elsewhere, unspecified severity, with psychotic disturbance: Secondary | ICD-10-CM | POA: Diagnosis present

## 2023-03-20 DIAGNOSIS — G309 Alzheimer's disease, unspecified: Secondary | ICD-10-CM | POA: Diagnosis present

## 2023-03-20 DIAGNOSIS — I2510 Atherosclerotic heart disease of native coronary artery without angina pectoris: Secondary | ICD-10-CM | POA: Diagnosis present

## 2023-03-20 DIAGNOSIS — R4182 Altered mental status, unspecified: Principal | ICD-10-CM

## 2023-03-20 DIAGNOSIS — Z7189 Other specified counseling: Secondary | ICD-10-CM | POA: Diagnosis not present

## 2023-03-20 DIAGNOSIS — E870 Hyperosmolality and hypernatremia: Secondary | ICD-10-CM | POA: Diagnosis present

## 2023-03-20 DIAGNOSIS — Z7982 Long term (current) use of aspirin: Secondary | ICD-10-CM

## 2023-03-20 DIAGNOSIS — Z8546 Personal history of malignant neoplasm of prostate: Secondary | ICD-10-CM

## 2023-03-20 DIAGNOSIS — R6520 Severe sepsis without septic shock: Secondary | ICD-10-CM | POA: Diagnosis present

## 2023-03-20 DIAGNOSIS — E872 Acidosis, unspecified: Secondary | ICD-10-CM | POA: Diagnosis present

## 2023-03-20 DIAGNOSIS — E785 Hyperlipidemia, unspecified: Secondary | ICD-10-CM

## 2023-03-20 DIAGNOSIS — Z91040 Latex allergy status: Secondary | ICD-10-CM

## 2023-03-20 DIAGNOSIS — Z794 Long term (current) use of insulin: Secondary | ICD-10-CM

## 2023-03-20 DIAGNOSIS — E66813 Obesity, class 3: Secondary | ICD-10-CM | POA: Diagnosis present

## 2023-03-20 DIAGNOSIS — E1165 Type 2 diabetes mellitus with hyperglycemia: Secondary | ICD-10-CM | POA: Diagnosis present

## 2023-03-20 DIAGNOSIS — E1122 Type 2 diabetes mellitus with diabetic chronic kidney disease: Secondary | ICD-10-CM | POA: Diagnosis present

## 2023-03-20 DIAGNOSIS — G9341 Metabolic encephalopathy: Secondary | ICD-10-CM | POA: Diagnosis present

## 2023-03-20 DIAGNOSIS — R7989 Other specified abnormal findings of blood chemistry: Secondary | ICD-10-CM | POA: Diagnosis present

## 2023-03-20 DIAGNOSIS — I739 Peripheral vascular disease, unspecified: Secondary | ICD-10-CM | POA: Diagnosis present

## 2023-03-20 DIAGNOSIS — G4733 Obstructive sleep apnea (adult) (pediatric): Secondary | ICD-10-CM | POA: Diagnosis present

## 2023-03-20 DIAGNOSIS — Z6841 Body Mass Index (BMI) 40.0 and over, adult: Secondary | ICD-10-CM | POA: Diagnosis not present

## 2023-03-20 DIAGNOSIS — I130 Hypertensive heart and chronic kidney disease with heart failure and stage 1 through stage 4 chronic kidney disease, or unspecified chronic kidney disease: Secondary | ICD-10-CM | POA: Diagnosis present

## 2023-03-20 DIAGNOSIS — Z8249 Family history of ischemic heart disease and other diseases of the circulatory system: Secondary | ICD-10-CM

## 2023-03-20 DIAGNOSIS — N281 Cyst of kidney, acquired: Secondary | ICD-10-CM | POA: Diagnosis present

## 2023-03-20 DIAGNOSIS — Z91041 Radiographic dye allergy status: Secondary | ICD-10-CM

## 2023-03-20 DIAGNOSIS — N1832 Chronic kidney disease, stage 3b: Secondary | ICD-10-CM | POA: Diagnosis present

## 2023-03-20 DIAGNOSIS — J9600 Acute respiratory failure, unspecified whether with hypoxia or hypercapnia: Secondary | ICD-10-CM | POA: Diagnosis not present

## 2023-03-20 DIAGNOSIS — E1129 Type 2 diabetes mellitus with other diabetic kidney complication: Secondary | ICD-10-CM | POA: Diagnosis present

## 2023-03-20 DIAGNOSIS — Z89511 Acquired absence of right leg below knee: Secondary | ICD-10-CM

## 2023-03-20 DIAGNOSIS — Z833 Family history of diabetes mellitus: Secondary | ICD-10-CM

## 2023-03-20 DIAGNOSIS — Z87891 Personal history of nicotine dependence: Secondary | ICD-10-CM

## 2023-03-20 DIAGNOSIS — N1831 Chronic kidney disease, stage 3a: Secondary | ICD-10-CM

## 2023-03-20 DIAGNOSIS — Z888 Allergy status to other drugs, medicaments and biological substances status: Secondary | ICD-10-CM

## 2023-03-20 DIAGNOSIS — Z79899 Other long term (current) drug therapy: Secondary | ICD-10-CM

## 2023-03-20 DIAGNOSIS — A419 Sepsis, unspecified organism: Secondary | ICD-10-CM | POA: Diagnosis present

## 2023-03-20 DIAGNOSIS — Z7901 Long term (current) use of anticoagulants: Secondary | ICD-10-CM

## 2023-03-20 DIAGNOSIS — Z9049 Acquired absence of other specified parts of digestive tract: Secondary | ICD-10-CM

## 2023-03-20 DIAGNOSIS — I21A1 Myocardial infarction type 2: Secondary | ICD-10-CM | POA: Diagnosis present

## 2023-03-20 DIAGNOSIS — J40 Bronchitis, not specified as acute or chronic: Secondary | ICD-10-CM | POA: Diagnosis present

## 2023-03-20 DIAGNOSIS — G40909 Epilepsy, unspecified, not intractable, without status epilepticus: Secondary | ICD-10-CM | POA: Diagnosis present

## 2023-03-20 LAB — HEPARIN LEVEL (UNFRACTIONATED): Heparin Unfractionated: <0.1 [IU]/mL — ABNORMAL LOW (ref 0.30–0.70)

## 2023-03-20 LAB — COMPREHENSIVE METABOLIC PANEL
ALT: 36 U/L (ref 0–44)
AST: 60 U/L — ABNORMAL HIGH (ref 15–41)
Albumin: 3.1 g/dL — ABNORMAL LOW (ref 3.5–5.0)
Alkaline Phosphatase: 130 U/L — ABNORMAL HIGH (ref 38–126)
Anion gap: 17 — ABNORMAL HIGH (ref 5–15)
BUN: 105 mg/dL — ABNORMAL HIGH (ref 8–23)
CO2: 23 mmol/L (ref 22–32)
Calcium: 8.7 mg/dL — ABNORMAL LOW (ref 8.9–10.3)
Chloride: 103 mmol/L (ref 98–111)
Creatinine, Ser: 4.86 mg/dL — ABNORMAL HIGH (ref 0.61–1.24)
GFR, Estimated: 11 mL/min — ABNORMAL LOW (ref >60)
Glucose, Bld: 322 mg/dL — ABNORMAL HIGH (ref 70–99)
Potassium: 3.9 mmol/L (ref 3.5–5.1)
Sodium: 143 mmol/L (ref 135–145)
Total Bilirubin: 0.8 mg/dL (ref 0.3–1.2)
Total Protein: 7.7 g/dL (ref 6.5–8.1)

## 2023-03-20 LAB — CBC WITH DIFFERENTIAL/PLATELET
Abs Immature Granulocytes: 0.09 10*3/uL — ABNORMAL HIGH (ref 0.00–0.07)
Basophils Absolute: 0 10*3/uL (ref 0.0–0.1)
Basophils Relative: 0 %
Eosinophils Absolute: 0 10*3/uL (ref 0.0–0.5)
Eosinophils Relative: 0 %
HCT: 45.3 % (ref 39.0–52.0)
Hemoglobin: 15.3 g/dL (ref 13.0–17.0)
Immature Granulocytes: 1 %
Lymphocytes Relative: 5 %
Lymphs Abs: 0.7 10*3/uL (ref 0.7–4.0)
MCH: 29.4 pg (ref 26.0–34.0)
MCHC: 33.8 g/dL (ref 30.0–36.0)
MCV: 87.1 fL (ref 80.0–100.0)
Monocytes Absolute: 1.7 10*3/uL — ABNORMAL HIGH (ref 0.1–1.0)
Monocytes Relative: 11 %
Neutro Abs: 12.3 10*3/uL — ABNORMAL HIGH (ref 1.7–7.7)
Neutrophils Relative %: 83 %
Platelets: 316 10*3/uL (ref 150–400)
RBC: 5.2 MIL/uL (ref 4.22–5.81)
RDW: 14.7 % (ref 11.5–15.5)
WBC: 14.8 10*3/uL — ABNORMAL HIGH (ref 4.0–10.5)
nRBC: 0 % (ref 0.0–0.2)

## 2023-03-20 LAB — TROPONIN I (HIGH SENSITIVITY)
Troponin I (High Sensitivity): 377 ng/L (ref <18)
Troponin I (High Sensitivity): 321 ng/L (ref <18)

## 2023-03-20 LAB — URINALYSIS, W/ REFLEX TO CULTURE (INFECTION SUSPECTED)
Bilirubin Urine: NEGATIVE
Glucose, UA: NEGATIVE mg/dL
Ketones, ur: NEGATIVE mg/dL
Leukocytes,Ua: NEGATIVE
Nitrite: NEGATIVE
Protein, ur: 30 mg/dL — AB
Specific Gravity, Urine: 1.014 (ref 1.005–1.030)
pH: 5 (ref 5.0–8.0)

## 2023-03-20 LAB — LIPASE, BLOOD: Lipase: 41 U/L (ref 11–51)

## 2023-03-20 LAB — RESP PANEL BY RT-PCR (RSV, FLU A&B, COVID)  RVPGX2
Influenza A by PCR: NEGATIVE
Influenza B by PCR: NEGATIVE
Resp Syncytial Virus by PCR: NEGATIVE
SARS Coronavirus 2 by RT PCR: NEGATIVE

## 2023-03-20 LAB — PROTIME-INR
INR: 1.6 — ABNORMAL HIGH (ref 0.8–1.2)
Prothrombin Time: 18.9 s — ABNORMAL HIGH (ref 11.4–15.2)

## 2023-03-20 LAB — LACTIC ACID, PLASMA
Lactic Acid, Venous: 4.5 mmol/L (ref 0.5–1.9)
Lactic Acid, Venous: 2.7 mmol/L (ref 0.5–1.9)
Lactic Acid, Venous: 1.6 mmol/L (ref 0.5–1.9)

## 2023-03-20 LAB — APTT: aPTT: 25 s (ref 24–36)

## 2023-03-20 LAB — CBG MONITORING, ED: Glucose-Capillary: 179 mg/dL — ABNORMAL HIGH (ref 70–99)

## 2023-03-20 LAB — GLUCOSE, CAPILLARY: Glucose-Capillary: 139 mg/dL — ABNORMAL HIGH (ref 70–99)

## 2023-03-20 MED ORDER — SODIUM CHLORIDE 0.9 % IV SOLN
2.0000 g | INTRAVENOUS | Status: DC
  Filled 2023-03-20: qty 20

## 2023-03-20 MED ORDER — DILTIAZEM HCL ER COATED BEADS 120 MG PO CP24: 120.0000 mg | ORAL_CAPSULE | Freq: Every day | ORAL | Status: DC

## 2023-03-20 MED ORDER — FENTANYL CITRATE PF 50 MCG/ML IJ SOSY
100.0000 ug | PREFILLED_SYRINGE | Freq: Once | INTRAMUSCULAR | Status: AC
  Administered 2023-03-20: 100 ug via INTRAVENOUS
  Filled 2023-03-20: qty 2

## 2023-03-20 MED ORDER — FLUOXETINE HCL 20 MG PO CAPS: 60.0000 mg | ORAL_CAPSULE | Freq: Every day | ORAL | Status: DC

## 2023-03-20 MED ORDER — SODIUM CHLORIDE 0.9 % IV SOLN
2.0000 g | Freq: Once | INTRAVENOUS | Status: AC
  Administered 2023-03-20: 2 g via INTRAVENOUS
  Filled 2023-03-20: qty 20

## 2023-03-20 MED ORDER — FERROUS SULFATE 325 (65 FE) MG PO TABS: 325.0000 mg | ORAL_TABLET | Freq: Every day | ORAL | Status: DC

## 2023-03-20 MED ORDER — METRONIDAZOLE 500 MG/100ML IV SOLN
500.0000 mg | Freq: Two times a day (BID) | INTRAVENOUS | Status: DC
  Administered 2023-03-20 – 2023-03-22 (×4): 500 mg via INTRAVENOUS
  Filled 2023-03-20 (×4): qty 100

## 2023-03-20 MED ORDER — ACETAMINOPHEN 10 MG/ML IV SOLN
1000.0000 mg | Freq: Once | INTRAVENOUS | Status: AC
  Administered 2023-03-20: 1000 mg via INTRAVENOUS
  Filled 2023-03-20: qty 100

## 2023-03-20 MED ORDER — TRIMETHOPRIM 100 MG PO TABS: 100.0000 mg | ORAL_TABLET | Freq: Every day | ORAL | Status: DC

## 2023-03-20 MED ORDER — INSULIN ASPART 100 UNIT/ML IJ SOLN
0.0000 [IU] | Freq: Three times a day (TID) | INTRAMUSCULAR | Status: DC
Start: 2023-03-20 — End: 2023-03-22
  Administered 2023-03-20 – 2023-03-22 (×5): 2 [IU] via SUBCUTANEOUS
  Filled 2023-03-20 (×4): qty 1

## 2023-03-20 MED ORDER — HEPARIN BOLUS VIA INFUSION
4000.0000 [IU] | Freq: Once | INTRAVENOUS | Status: AC
  Administered 2023-03-20: 4000 [IU] via INTRAVENOUS
  Filled 2023-03-20: qty 4000

## 2023-03-20 MED ORDER — METOPROLOL SUCCINATE ER 50 MG PO TB24: 75.0000 mg | ORAL_TABLET | Freq: Two times a day (BID) | ORAL | Status: DC

## 2023-03-20 MED ORDER — BUMETANIDE 1 MG PO TABS: 2.0000 mg | ORAL_TABLET | Freq: Every day | ORAL | Status: DC

## 2023-03-20 MED ORDER — METRONIDAZOLE 500 MG/100ML IV SOLN
500.0000 mg | Freq: Once | INTRAVENOUS | Status: AC
Start: 2023-03-20 — End: 2023-03-20
  Administered 2023-03-20: 500 mg via INTRAVENOUS
  Filled 2023-03-20: qty 100

## 2023-03-20 MED ORDER — DIVALPROEX SODIUM 500 MG PO DR TAB
500.0000 mg | DELAYED_RELEASE_TABLET | Freq: Every day | ORAL | Status: DC
  Filled 2023-03-20 (×2): qty 1

## 2023-03-20 MED ORDER — LIDOCAINE HCL URETHRAL/MUCOSAL 2 % EX GEL
1.0000 | Freq: Once | CUTANEOUS | Status: AC
  Administered 2023-03-20: 1 via URETHRAL
  Filled 2023-03-20: qty 10

## 2023-03-20 MED ORDER — ASPIRIN 81 MG PO TBEC: 81.0000 mg | DELAYED_RELEASE_TABLET | Freq: Every day | ORAL | Status: DC

## 2023-03-20 MED ORDER — HEPARIN (PORCINE) 25000 UT/250ML-% IV SOLN
1050.0000 [IU]/h | INTRAVENOUS | Status: DC
  Administered 2023-03-20: 1200 [IU]/h via INTRAVENOUS
  Administered 2023-03-21: 1050 [IU]/h via INTRAVENOUS
  Filled 2023-03-20 (×2): qty 250

## 2023-03-20 MED ORDER — INSULIN GLARGINE-YFGN 100 UNIT/ML ~~LOC~~ SOLN
25.0000 [IU] | Freq: Every day | SUBCUTANEOUS | Status: DC
  Filled 2023-03-20 (×3): qty 0.25

## 2023-03-20 MED ORDER — ATORVASTATIN CALCIUM 80 MG PO TABS: 80.0000 mg | ORAL_TABLET | Freq: Every day | ORAL | Status: DC

## 2023-03-20 MED ORDER — CALCIUM POLYCARBOPHIL 625 MG PO TABS
625.0000 mg | ORAL_TABLET | Freq: Every day | ORAL | Status: DC
  Filled 2023-03-20 (×2): qty 1

## 2023-03-20 MED ORDER — ACETAMINOPHEN 500 MG PO TABS: 500.0000 mg | ORAL_TABLET | Freq: Three times a day (TID) | ORAL | Status: DC | PRN

## 2023-03-20 MED ORDER — SODIUM CHLORIDE 0.9 % IV SOLN: INTRAVENOUS | Status: AC

## 2023-03-20 MED ORDER — SODIUM CHLORIDE 0.9 % IV BOLUS (SEPSIS)
1000.0000 mL | Freq: Once | INTRAVENOUS | Status: AC
Start: 2023-03-20 — End: 2023-03-20
  Administered 2023-03-20: 1000 mL via INTRAVENOUS

## 2023-03-20 MED ORDER — ISOSORBIDE MONONITRATE ER 60 MG PO TB24: 60.0000 mg | ORAL_TABLET | Freq: Every day | ORAL | Status: DC

## 2023-03-20 MED ORDER — ENSURE MAX PROTEIN PO LIQD
11.0000 [oz_av] | Freq: Every day | ORAL | Status: DC
  Filled 2023-03-20: qty 330

## 2023-03-20 MED ORDER — MEMANTINE HCL 10 MG PO TABS: 10.0000 mg | ORAL_TABLET | Freq: Two times a day (BID) | ORAL | Status: DC

## 2023-03-20 MED ORDER — BISACODYL 5 MG PO TBEC: 5.0000 mg | DELAYED_RELEASE_TABLET | Freq: Every day | ORAL | Status: DC | PRN

## 2023-03-20 MED ORDER — METOPROLOL TARTRATE 5 MG/5ML IV SOLN
5.0000 mg | Freq: Three times a day (TID) | INTRAVENOUS | Status: DC | PRN
  Administered 2023-03-20 – 2023-03-21 (×3): 5 mg via INTRAVENOUS
  Filled 2023-03-20 (×3): qty 5

## 2023-03-20 NOTE — ED Triage Notes (Signed)
BIB [facility] from [facility] (see paperwork) for AMS, abd pain. Conflicting LKN time includes last night, days and >1 week. BS 450. Pt awake, intermittent flailing and yelping, responds to pain, does not follow commands, yelping moan only, reports was talking yesterday, son arriving to [facility]. EMS reports baseline last night, son states changes over days/ weeks. Airway patent. VSS. HR tachy. Skin W&D. EDP present on arrival.

## 2023-03-20 NOTE — ED Provider Notes (Signed)
[facility] Provider Note   Event Date/Time   First MD Initiated Contact with Patient 03/20/23 1032     (approximate) History  No chief complaint on file.  HPI [name] is a 88 y.o. male who presents via EMS from [facility] for altered mental status.  Patient has history of type 2 diabetes, bilateral BKA, hypertension, A-fib, CHF, CKD, and hyperlipidemia.  Staff notes patient's last known well was "sometime last night".  Patient arrives GCS 11 ROS: Unable to assess   Physical Exam  Triage Vital Signs: ED Triage Vitals  Encounter Vitals Group     BP      Systolic BP Percentile      Diastolic BP Percentile      Pulse      Resp      Temp      Temp src      SpO2      Weight      Height      Head Circumference      Peak Flow      Pain Score      Pain Loc      Pain Education      Exclude from Growth Chart    Most recent vital signs: There were no vitals filed for this visit. General: Asleep on stretcher and awoken to voice CV:  Good peripheral perfusion.  Resp:  Normal effort.  Abd:  No distention.  Other:  Elderly overweight Caucasian male asleep on the stretcher.  GCS 11 (E3, V3, M5).  Nonpurposeful movements of bilateral upper extremities. ED Results / Procedures / Treatments  Labs (all labs ordered are listed, but only abnormal results are displayed) Labs Reviewed - No data to display EKG ED ECG REPORT I, Merwyn Katos, the attending physician, personally viewed and interpreted this ECG. Date: 03/20/2023 EKG Time: 1035 Rate: 144 Rhythm: A-fib with RVR QRS Axis: normal Intervals: normal ST/T Wave abnormalities: normal Narrative Interpretation: A-fib with RVR.  No evidence of acute ischemia RADIOLOGY ED MD interpretation:  *** -Agree with radiology assessment Official radiology report(s): No results found. PROCEDURES: Critical Care performed: {CriticalCareYesNo:19197::"Yes, see critical care procedure  note(s)","No"} Procedures MEDICATIONS ORDERED IN ED: Medications - No data to display IMPRESSION / MDM / ASSESSMENT AND PLAN / ED COURSE  I reviewed the triage vital signs and the nursing notes.                             The patient is on the cardiac monitor to evaluate for evidence of arrhythmia and/or significant heart rate changes. Patient's presentation is most consistent with {EM COPA:27473} {Remember to include, when applicable, any/all of the following data: independent review of imaging independent review of labs (comment specifically on pertinent positives and negatives) review of specific prior hospitalizations, PCP/specialist notes, etc. discuss meds given and prescribed document any discussion with consultants (including hospitalists) any clinical decision tools you used and why (PECARN, NEXUS, etc.) did you consider admitting the patient? document social determinants of health affecting patient's care (homelessness, inability to follow up in a timely fashion, etc) document any pre-existing conditions increasing risk on current visit (e.g. diabetes and HTN increasing danger of high-risk chest pain/ACS) describes what meds you gave (especially parenteral) and why any other interventions?:1}   FINAL CLINICAL IMPRESSION(S) / ED DIAGNOSES   Final diagnoses:  None   Rx / DC Orders   ED Discharge Orders  None      Note:  This document was prepared using Dragon voice recognition software and may include unintentional dictation errors.

## 2023-03-20 NOTE — ED Notes (Signed)
P/ chest xray finished. Son into room, at [facility].

## 2023-03-20 NOTE — Plan of Care (Signed)
  Problem: Education: Goal: Ability to describe self-care measures that may prevent or decrease complications (Diabetes Survival Skills Education) will improve 03/20/2023 2011 by Glenford Peers, RN Outcome: Not Progressing 03/20/2023 2010 by Glenford Peers, RN Outcome: Progressing Goal: Individualized Educational Video(s) 03/20/2023 2011 by Glenford Peers, RN Outcome: Not Progressing 03/20/2023 2010 by Glenford Peers, RN Outcome: Progressing   Problem: Coping: Goal: Ability to adjust to condition or change in health will improve 03/20/2023 2011 by Glenford Peers, RN Outcome: Not Progressing 03/20/2023 2010 by Glenford Peers, RN Outcome: Progressing   Problem: Fluid Volume: Goal: Ability to maintain a balanced intake and output will improve 03/20/2023 2011 by Glenford Peers, RN Outcome: Not Progressing 03/20/2023 2010 by Glenford Peers, RN Outcome: Progressing

## 2023-03-20 NOTE — ED Notes (Addendum)
Pt to CT, IVF paused. Son "leaving, will return, call for changes/ updates".

## 2023-03-20 NOTE — Progress Notes (Signed)
CODE SEPSIS - PHARMACY COMMUNICATION  **Broad Spectrum Antibiotics should be administered within 1 hour of Sepsis diagnosis**  Time Code Sepsis Called/Page Received: 1043  Antibiotics Ordered: ceftriaxone, metronidazole  Time of 1st antibiotic administration: 1104  Additional action taken by pharmacy: none required  If necessary, Name of Provider/Nurse Contacted: N/A    Lowella Bandy ,PharmD Clinical Pharmacist  03/20/2023  10:53 AM

## 2023-03-20 NOTE — ED Notes (Addendum)
EDP at [facility] speaking with son. Pt calm, NAD, with intermittent yelling out/ yelping moan.

## 2023-03-20 NOTE — Consult Note (Signed)
PHARMACY - ANTICOAGULATION CONSULT NOTE  Pharmacy Consult for IV Heparin Indication: atrial fibrillation  Patient Measurements: Weight: 86 kg (189 lb 9.5 oz)  Labs: Recent Labs    03/20/23 1046 03/20/23 1234  HGB 15.3  --   HCT 45.3  --   PLT 316  --   APTT 25  --   LABPROT 18.9*  --   INR 1.6*  --   CREATININE 4.86*  --   TROPONINIHS 377* 321*    Estimated Creatinine Clearance: 8.6 mL/min (A) (by C-G formula based on SCr of 4.86 mg/dL (H)).  Medical History: Past Medical History:  Diagnosis Date   Acute respiratory failure with hypoxia (HCC) 02/08/2019   Arthritis    Atrial fibrillation (HCC)    Carcinoma of prostate (HCC)    CHF (congestive heart failure) (HCC)    Chronic kidney disease    Diabetes mellitus without complication [facility])    ED (erectile dysfunction)    Frequent urination    Hyperlipidemia    Hypertension    Leukocytosis 09/06/2016   Moderate mitral insufficiency    Myocardial injury 08/14/2022   Peripheral vascular disease (HCC)    Pneumonia 04/2016   Prostate cancer (HCC)    Sleep apnea    OSA--USE C-PAP   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 Appears patient is actively filling Xarelto 15 mg tablets outpatient. Last dose was 10/26 at 1828  Assessment: Patient is an 88 y/o M with medical history as above and including Afib on Xarelto presenting to the ED 10/27 with altered mental status and abdominal pain. Patient in Afib with RVR. Code sepsis activated. Pharmacy consulted to initiate and manage heparin infusion for Afib.  Baseline aPTT 25s, INR 1.6. Baseline heparin level is pending. Baseline H&H, platelets are within normal limits.  Goal of Therapy:  Heparin level 0.3-0.7 units/ml aPTT 66 - 102 seconds Monitor platelets by anticoagulation protocol: Yes   Plan:  --Heparin 4000 unit IV bolus followed by continuous infusion at  1200 units/hr --aPTT 8 hours from initiation of infusion. Plan to follow aPTT for now given anticipated interference of Xarelto on anti-Xa level. Switch over to anti-Xa level monitoring only once correlation is established --Daily CBC per protocol while on IV heparin  Tressie Ellis 03/20/2023,3:39 PM

## 2023-03-20 NOTE — ED Notes (Signed)
Cletis Athens, RN aware of 140 HR.

## 2023-03-20 NOTE — ED Notes (Addendum)
Admitting MD at [facility]. Pt intermittently opening eyes, does not follow commands, prefers eyes closed, intermittent yelps/moans, intermittent slow flailing of extremities x4, primarily restless.

## 2023-03-20 NOTE — Progress Notes (Signed)
   03/20/23 1744  Vitals  Temp 97.6 F (36.4 C)  BP (!) 155/135  MAP (mmHg) 143  BP Location Right Arm  BP Method Automatic  Patient Position (if appropriate) Lying  Pulse Rate (!) 135  Pulse Rate Source Monitor  Resp 17  MEWS COLOR  MEWS Score Color Red  Oxygen Therapy  SpO2 97 %  MEWS Score  MEWS Temp 0  MEWS Systolic 0  MEWS Pulse 3  MEWS RR 0  MEWS LOC 2  MEWS Score 5

## 2023-03-20 NOTE — ED Notes (Signed)
Advised nurse that patient has ready bed 

## 2023-03-20 NOTE — ED Notes (Signed)
Pt calmer, NAD, resting in stretcher, VSS, son at [facility].

## 2023-03-20 NOTE — ED Notes (Signed)
Pt showing Afib HR 151.I notified Jon the Nurse which he was aware and in the pt room

## 2023-03-20 NOTE — H&P (Signed)
History and Physical    Patient: [name] QIH:[id number] DOB: 1935-02-06 DOA: March 28, 2023 DOS: the patient was seen and examined on 2023-03-28 PCP: Luciana Axe, NP  Patient coming from:  [facility]  Chief Complaint:  Chief Complaint  Patient presents with   Altered Mental Status   HPI: [name] is a 88 y.o. male with medical history significant of atrial fibrillation (Xarelto), systolic heart failure, chronic kidney disease, hypertension, hyperlipidemia, insulin-dependent type 2 diabetes, dementia who presents for altered mental status.  History provided by son, ED provider and chart.  History limited as patient is altered and has Alzheimer's  Son reports dad has been alerted for the past week though EMS reported last known normal was last night. Erbie can normally feed himself, transfer and put on his legs on by himself.  Has been having abdominal pain the last couple of days.  He has been having elevated blood sugars.  Patient febrile, tachycardic (120s-140s), tachypneic while satting 94% on room air.  He had significant lactic acidosis, 4.5 which decreased to 2.7 with aggressive hydration per sepsis protocol.  He has leukocytosis, WBC 14.8 with left shift.  He is hyperglycemic (glucose 322), significantly elevated serum creatinine 4.86, with elevated AST and mildly elevated ALP.  His anion gap is also elevated at 17.  Urinalysis not concerning for acute cystitis.  Lipase was normal doubt pancreatitis.  Initial troponin 377 which down trended to 322 which was thought to be demand ischemia.  COVID, influenza and RSV panel was negative.  Blood cultures were obtained.  Portable chest x-ray showed low lung volumes but no acute findings.  CT head showed likely sinusitis.  CT abdomen pelvis showed airway thickening and airway plugging in both lower lobes concerning for bronchitis.  Patient given ceftriaxone and Flagyl in the ED.  Additionally, 1000 mg IV Tylenol  given.  He had 1 L fluid bolus and is on 150 mL/h IV fluids for now.  ED physician consulted the hospitalist service to evaluate for admission.   Review of Systems: unable to review all systems due to the inability of the patient to answer questions. Past Medical History:  Diagnosis Date   Acute respiratory failure with hypoxia (HCC) 02/08/2019   Arthritis    Atrial fibrillation (HCC)    Carcinoma of prostate (HCC)    CHF (congestive heart failure) (HCC)    Chronic kidney disease    Diabetes mellitus without complication [facility])    ED (erectile dysfunction)    Frequent urination    Hyperlipidemia    Hypertension    Leukocytosis 09/06/2016   Moderate mitral insufficiency    Myocardial injury 08/14/2022   Peripheral vascular disease (HCC)    Pneumonia 04/2016   Prostate cancer (HCC)    Sleep apnea    OSA--USE C-PAP   Ulcer of left foot due to type 2 diabetes mellitus (HCC)    Urinary stress incontinence, male    Past Surgical History:  Procedure Laterality Date   AMPUTATION Left 01/12/2017   Procedure: AMPUTATION BELOW KNEE;  Surgeon: Annice Needy, MD;  Location: [facility] ORS;  Service: General;  Laterality: Left;   AMPUTATION Right 12/27/2018   Procedure: AMPUTATION BELOW KNEE;  Surgeon: Annice Needy, MD;  Location: [facility] ORS;  Service: General;  Laterality: Right;   APLIGRAFT PLACEMENT Left 10/15/2016   Procedure: APLIGRAFT PLACEMENT;  Surgeon: Recardo Evangelist, DPM;  Location: [facility] ORS;  Service: Podiatry;  Laterality: Left;  necrotic ulcer   CHOLECYSTECTOMY  COLONOSCOPY WITH PROPOFOL N/A 01/02/2019   Procedure: COLONOSCOPY WITH PROPOFOL;  Surgeon: Midge Minium, MD;  Location: [facility] ENDOSCOPY;  Service: Endoscopy;  Laterality: N/A;   COLONOSCOPY WITH PROPOFOL N/A 01/05/2019   Procedure: COLONOSCOPY WITH PROPOFOL;  Surgeon: Midge Minium, MD;  Location: [facility] ENDOSCOPY;  Service: Endoscopy;  Laterality: N/A;   ESOPHAGOGASTRODUODENOSCOPY (EGD) WITH PROPOFOL N/A 01/02/2019   Procedure:  ESOPHAGOGASTRODUODENOSCOPY (EGD) WITH PROPOFOL;  Surgeon: Midge Minium, MD;  Location: [facility] ENDOSCOPY;  Service: Endoscopy;  Laterality: N/A;   EYE SURGERY Bilateral    Cataract Extraction with IOL   HIP ARTHROPLASTY Right 10/13/2018   Procedure: ARTHROPLASTY BIPOLAR HIP (HEMIARTHROPLASTY);  Surgeon: Deeann Saint, MD;  Location: [facility] ORS;  Service: Orthopedics;  Laterality: Right;   IRRIGATION AND DEBRIDEMENT FOOT Left 10/15/2016   Procedure: IRRIGATION AND DEBRIDEMENT FOOT-EXCISIONAL DEBRIDEMENT OF SKIN 3RD, 4TH AND 5TH TOES WITH APPLICATION OF APLIGRAFT;  Surgeon: Recardo Evangelist, DPM;  Location: [facility] ORS;  Service: Podiatry;  Laterality: Left;  necrotic,gangrene   IRRIGATION AND DEBRIDEMENT FOOT Left 12/09/2016   Procedure: IRRIGATION AND DEBRIDEMENT FOOT-MUSCLE/FASCIA, RESECTION OF FOURTH AND FIFTH METATARSAL NECROTIC BONE;  Surgeon: Recardo Evangelist, DPM;  Location: [facility] ORS;  Service: Podiatry;  Laterality: Left;   IRRIGATION AND DEBRIDEMENT FOOT Right 12/23/2018   Procedure: IRRIGATION AND DEBRIDEMENT FOOT and wound vac application;  Surgeon: Gwyneth Revels, DPM;  Location: [facility] ORS;  Service: Podiatry;  Laterality: Right;   LEFT HEART CATH N/A 06/25/2019   Procedure: Left Heart Cath and Coronary Angiography;  Surgeon: Marcina Millard, MD;  Location: [facility] INVASIVE CV LAB;  Service: Cardiovascular;  Laterality: N/A;   LEFT HEART CATH AND CORONARY ANGIOGRAPHY N/A 06/05/2018   Procedure: LEFT HEART CATH AND CORONARY ANGIOGRAPHY;  Surgeon: Alwyn Pea, MD;  Location: [facility] INVASIVE CV LAB;  Service: Cardiovascular;  Laterality: N/A;   LOWER EXTREMITY ANGIOGRAPHY Left 08/16/2016   Procedure: Lower Extremity Angiography;  Surgeon: Annice Needy, MD;  Location: [facility] INVAS[facility] CV LAB;  Service: Cardiovascular;  Laterality: Left;   LOWER EXTREMITY ANGIOGRAPHY Left 09/01/2016   Procedure: Lower Extremity Angiography;  Surgeon: Annice Needy, MD;  Location: [facility] IN[facility] CV LAB;  Service:  Cardiovascular;  Laterality: Left;   LOWER EXTREMITY ANGIOGRAPHY Left 10/14/2016   Procedure: Lower Extremity Angiography;  Surgeon: Annice Needy, MD;  Location: [facility] INV[facility] CV LAB;  Service: Cardiovascular;  Laterality: Left;   LOWER EXTREMITY ANGIOGRAPHY Left 12/20/2016   Procedure: Lower Extremity Angiography;  Surgeon: Annice Needy, MD;  Location: [facility];  Service: Cardiovascular;  Laterality: Left;   LOWER EXTREMITY ANGIOGRAPHY Right 12/18/2018   Procedure: LOWER EXTREMITY ANGIOGRAPHY;  Surgeon: Annice Needy, MD;  Location: [facility];  Service: Cardiovascular;  Laterality: Right;   LOWER EXTREMITY INTERVENTION  09/01/2016   Procedure: Lower Extremity Intervention;  Surgeon: Annice Needy, MD;  Location: [facility] INVASIVE CV LAB;  Service: Cardiovascular;;   PROSTATE SURGERY     removal   TONSILLECTOMY     as a child   Social History:  reports that he quit smoking about 28 years ago. His smoking use included cigarettes. He has been exposed to tobacco smoke. He has never used smokeless tobacco. He reports that he does not drink alcohol and does not use drugs.  Allergies  Allergen Reactions   Iodinated Contrast Media Shortness Of Breath and Anaphylaxis    SOB   Iohexol Shortness Of Breath     Desc: Respiratory Distress, Laryngedema, diaphoresis, Onset Date: [id number]    Metrizamide  Shortness Of Breath    SOB SOB SOB    Latex Rash    Family History  Problem Relation Age of Onset   Hypertension Mother    Diabetes Mother    Prostate cancer Neg Hx    Bladder Cancer Neg Hx    Kidney disease Neg Hx     Prior to Admission medications   Medication Sig Start Date End Date Taking? Authorizing Provider  aspirin 81 MG EC tablet Take 81 mg by mouth daily.    [provider]  atorvastatin (LIPITOR) 80 MG tablet Take 80 mg by mouth at bedtime.    [provider]  bisacodyl (DULCOLAX) 5 MG EC tablet Take 5 mg by mouth daily as needed for moderate  constipation.    [provider]  bumetanide (BUMEX) 2 MG tablet Take 1 tablet (2 mg total) by mouth daily. 02/26/23   Marcelino Duster, MD  divalproex (DEPAKOTE) 500 MG DR tablet Take 500 mg by mouth daily.     [provider]  Ensure Max Protein (ENSURE MAX PROTEIN) LIQD Take 330 mLs (11 oz total) by mouth 2 (two) times daily. Patient taking differently: Take 11 oz by mouth daily. 04/10/20   Arnetha Courser, MD  ferrous sulfate 325 (65 FE) MG tablet Take 325 mg by mouth daily with breakfast.    [provider]  FLUoxetine HCl 60 MG TABS Take 1 tablet by mouth daily. 02/04/23   [provider]  insulin aspart (NOVOLOG) 100 UNIT/ML injection Inject 0-9 Units into the skin 3 (three) times daily with meals. CBG 70 - 120: 0 units CBG 121 - 150: 1 unit CBG 151 - 200: 2 units CBG 201 - 250: 3 units CBG 251 - 300: 5 units CBG 301 - 350: 7 units CBG 351 - 400: 9 units CBG > 400: call MD and obtain STAT lab verification 02/25/23   Marcelino Duster, MD  isosorbide mononitrate (IMDUR) 30 MG 24 hr tablet Take 60 mg by mouth daily.    [provider]  LANTUS 100 UNIT/ML injection Inject 0.1 mLs (10 Units total) into the skin at bedtime. 02/25/23   Marcelino Duster, MD  melatonin 5 MG TABS Take 1 tablet (5 mg total) by mouth at bedtime as needed. 02/25/23 03/27/23  Marcelino Duster, MD  memantine (NAMENDA) 10 MG tablet Take 10 mg by mouth 2 (two) times daily.    [provider]  metoprolol succinate (TOPROL-XL) 25 MG 24 hr tablet Take 3 tablets (75 mg total) by mouth 2 (two) times daily. 02/25/23   Marcelino Duster, MD  Multiple Vitamin (MULTIVITAMIN) tablet Take 1 tablet by mouth daily.    [provider]  nitroGLYCERIN (NITROSTAT) 0.4 MG SL tablet Place 1 tablet (0.4 mg total) under the tongue every 5 (five) minutes as needed for chest pain. 10/25/20   Enedina Finner, MD  NOVOLOG FLEXPEN 100 UNIT/ML FlexPen Inject 6 Units into the skin 3  (three) times daily with meals. 02/25/23   Marcelino Duster, MD  polycarbophil (FIBERCON) 625 MG tablet Take 625 mg by mouth daily. Patient not taking: Reported on 02/17/2023    [provider]  Rivaroxaban (XARELTO) 15 MG TABS tablet Take 1 tablet (15 mg total) by mouth daily with supper. Hold Xarelto for 3 days.  Start on January 09, 2019. 01/09/19   Shaune Pollack, MD  trimethoprim (TRIMPEX) 100 MG tablet Take 100 mg by mouth daily.    [provider]  Vibegron (GEMTESA) 75 MG TABS Take  1 tablet (75 mg total) by mouth daily. 12/14/22   Carman Ching, PA-C    Physical Exam: Vitals:   03/10/2023 1500 03/07/2023 1515 03/04/2023 1530 03/11/2023 1600  BP: (!) 142/101 (!) 130/102 (!) 141/102 (!) 143/108  Pulse:      Resp: (!) 25 (!) 25 (!) 25 (!) 25  Temp: 100.2 F (37.9 C) 100.3 F (37.9 C) 100.3 F (37.9 C) 100.1 F (37.8 C)  TempSrc:      SpO2: 92% 92% 90% 92%  Weight:       GEN:     writhing in bed, moaning, elderly obese male, not following commands  HENT:  mucus membranes moist, no nasal discharge  RESP:  clear to auscultation bilaterally, +increased work of breathing  CVS:  Tachycardic, irregular irregular rhythm ABD:  soft, generalized tenderness; no palpable masses EXT:   Bilateral BKA, ecchymoses on upper extremities NEURO:  alert but not following commands, jumbled speech,  Skin:   warm and dry, no visible wounds, dirty nails   Data Reviewed:  Relevant notes from primary care and specialist visits, past discharge summaries as available in EHR, including Care Everywhere. Prior diagnostic testing as pertinent to current admission diagnoses Updated medications and problem lists for reconciliation ED course, including vitals, labs, imaging, treatment and response to treatment Triage notes, nursing and pharmacy notes and ED provider's notes Notable results as noted in HPI  CT abdomen pelvis: Hemorrhagic cyst of the left kidney upper pole; nonobstructing 8  mm right mid renal calculus, extensive systemic atherosclerosis of the superior mesenteric celiac trunk  Assessment and Plan: Principal Problem:   Sepsis (HCC) Active Problems:   Atrial fibrillation with rapid ventricular response (HCC)   Seizure disorder (HCC)   Hyperlipidemia   Chronic kidney disease, stage 3a (HCC)   Type II diabetes mellitus with renal manifestations (HCC)   S/P bilateral BKA (below knee amputation) (HCC)   Primary hypertension   AKI (acute kidney injury) (HCC)   Lactic acidosis   Acute encephalopathy   Chronic systolic CHF (congestive heart failure) (HCC)   Sepsis Pt tachycardic, tachypneic with leukocytosis with elevated temperature 100.71F. Marland Kitchen Lactic acid 4.5 > 2.7.  Blood cultures obtained in ED. Flagyl and ceftriaxone started by EDP. UA not concerning for urinary tract infection.  Chest x-ray showing evidence of bronchitis.  - Follow up blood culture. - trend WBC  Acute encephalopathy History of dementia likely complicated by toxic metabolites.  CT head was without acute processes -Fall and aspiration precautions   Acute on chronic kidney disease stage III Baseline GFR around 1.7-2.1 however admission creatinine 4.86. -Status post 1 L nasal cannula -Continue IV fluids 150 mL/hr for 20 hours -Strict intake and output  Atrial fibrillation Patient with A-fib and RVR currently.  I suspect some of this may be related to acute sepsis and dehydration. -Metoprolol 5 mg IM hours for heart rates > 120 in leiu of Metoprolol 75 mg BID  -Holding home diltiazem 120 mg XL while NPO. - Hold home Xarelto due to acute kidney injury; Heparin per pharmacy   Insulin-dependent type 2 diabetes -Decrease home Lantus from 45 units at bedtime to 25 units while n.p.o. -Sliding scale insulin -Obtain AC  Elevated troponin Initial troponin 377 down trended to 322.  Suspect demand ischemia related to tachycardia.  EKG showing A-fib with RVR.  Chronic systolic heart  failure Stable. Echo performed on August 15, 2022 showed LV EF 35 to 40% with regional wall motion abnormalities and left atrial enlargement. -Resume  home Bumex when able, if not able to resume tomorrow will consider IV Lasix -Daily weights  Coronary artery disease -Continue home aspirin and Lipitor.  Last left heart cath showed occluded ostial LAD and medical therapy was recommended.  Seizures No recent seizure activity.  Resume Divalproex 500 mg DR when able to tolerate p.o. -Seizure precautions, fall precautions, aspiration precautions  Left kidney cyst Thought to be hemorrhagic cyst per CT however they recommended to consider renal protocol MRI with and without contrast -Outpatient follow-up   Advance Care Planning:   Code Status: Limited: Do not attempt resuscitation (DNR) -DNR-LIMITED -Do Not Intubate/DNI confirmed with son via telephone  Consults: None  Family Communication: Jobany Watrous [facility]) [phone]  Severity of Illness: The appropriate patient status for this patient is INPATIENT. Inpatient status is judged to be reasonable and necessary in order to provide the required intensity of service to ensure the patient's safety. The patient's presenting symptoms, physical exam findings, and initial radiographic and laboratory data in the context of their chronic comorbidities is felt to place them at high risk for further clinical deterioration. Furthermore, it is not anticipated that the patient will be medically stable for discharge from the hospital within 2 midnights of admission.   * I certify that at the point of admission it is my clinical judgment that the patient will require inpatient hospital care spanning beyond 2 midnights from the point of admission due to high intensity of service, high risk for further deterioration and high frequency of surveillance required.*  Author: Katha Cabal, DO 03/09/2023 4:33 PM  For on call review [email or web address].

## 2023-03-20 NOTE — ED Notes (Signed)
ED TO INPATIENT HANDOFF REPORT  ED Nurse Name and Phone #: Al Corpus, RN [phone]  S Name/Age/Gender [name] 88 y.o. male Room/Bed: ED11A/ED11A  Code Status   Code Status: Limited: Do not attempt resuscitation (DNR) -DNR-LIMITED -Do Not Intubate/DNI   Home/SNF/Other Skilled nursing facility  **Not alert, unable orientation Is this baseline? No   Triage Complete: Triage complete  Chief Complaint Sepsis [facility]) [A41.9]  Triage Note BIB [facility] from [facility] (see paperwork) for AMS, abd pain. Conflicting LKN time includes last night, days and >1 week. BS 450. Pt awake, intermittent flailing and yelping, responds to pain, does not follow commands, yelping moan only, reports was talking yesterday, son arriving to [facility]. EMS reports baseline last night, son states changes over days/ weeks. Airway patent. VSS. HR tachy. Skin W&D. EDP present on arrival.    Allergies Allergies  Allergen Reactions   Iodinated Contrast Media Shortness Of Breath and Anaphylaxis    SOB   Iohexol Shortness Of Breath     Desc: Respiratory Distress, Laryngedema, diaphoresis, Onset Date: [id number]    Metrizamide Shortness Of Breath    SOB SOB SOB    Latex Rash    Level of Care/Admitting Diagnosis ED Disposition     ED Disposition  Admit   Condition  --   Comment  Hospital Area: [facility] [[id number]]  Level of Care: Progressive [102]  Admit to Progressive based on following criteria: MULTISYSTEM THREATS such as stable sepsis, metabolic/electrolyte imbalance with or without encephalopathy that is responding to early treatment.  Covid Evaluation: Confirmed COVID Negative  Diagnosis: Sepsis [facility]) [[id number]]  Admitting Physician: Katha Cabal [[id number]]  Attending Physician: Katha Cabal [[id number]]  Certification:: I certify this patient will need inpatient services for at least 2 midnights  Expected Medical Readiness: 03/23/2023           B Medical/Surgery History Past Medical History:  Diagnosis Date   Acute respiratory failure with hypoxia (HCC) 02/08/2019   Arthritis    Atrial fibrillation (HCC)    Carcinoma of prostate (HCC)    CHF (congestive heart failure) (HCC)    Chronic kidney disease    Diabetes mellitus without complication (HCC)    ED (erectile dysfunction)    Frequent urination    Hyperlipidemia    Hypertension    Leukocytosis 09/06/2016   Moderate mitral insufficiency    Myocardial injury 08/14/2022   Peripheral vascular disease (HCC)    Pneumonia 04/2016   Prostate cancer (HCC)    Sleep apnea    OSA--USE C-PAP   Ulcer of left foot due to type 2 diabetes mellitus (HCC)    Urinary stress incontinence, male    Past Surgical History:  Procedure Laterality Date   AMPUTATION Left 01/12/2017   Procedure: AMPUTATION BELOW KNEE;  Surgeon: Annice Needy, MD;  Location: [facility] ORS;  Service: General;  Laterality: Left;   AMPUTATION Right 12/27/2018   Procedure: AMPUTATION BELOW KNEE;  Surgeon: Annice Needy, MD;  Location: [facility] ORS;  Service: General;  Laterality: Right;   APLIGRAFT PLACEMENT Left 10/15/2016   Procedure: APLIGRAFT PLACEMENT;  Surgeon: Recardo Evangelist, DPM;  Location: [facility] ORS;  Service: Podiatry;  Laterality: Left;  necrotic ulcer   CHOLECYSTECTOMY     COLONOSCOPY WITH PROPOFOL N/A 01/02/2019   Procedure: COLONOSCOPY WITH PROPOFOL;  Surgeon: Midge Minium, MD;  Location: [facility] ENDOSCOPY;  Service: Endoscopy;  Laterality: N/A;   COLONOSCOPY WITH PROPOFOL N/A 01/05/2019   Procedure: COLONOSCOPY WITH PROPOFOL;  Surgeon: Midge Minium, MD;  Location: [facility] ENDOSCOPY;  Service: Endoscopy;  Laterality: N/A;   ESOPHAGOGASTRODUODENOSCOPY (EGD) WITH PROPOFOL N/A 01/02/2019   Procedure: ESOPHAGOGASTRODUODENOSCOPY (EGD) WITH PROPOFOL;  Surgeon: Midge Minium, MD;  Location: [facility] ENDOSCOPY;  Service: Endoscopy;  Laterality: N/A;   EYE SURGERY Bilateral    Cataract Extraction with IOL   HIP ARTHROPLASTY Right  10/13/2018   Procedure: ARTHROPLASTY BIPOLAR HIP (HEMIARTHROPLASTY);  Surgeon: Deeann Saint, MD;  Location: [facility] ORS;  Service: Orthopedics;  Laterality: Right;   IRRIGATION AND DEBRIDEMENT FOOT Left 10/15/2016   Procedure: IRRIGATION AND DEBRIDEMENT FOOT-EXCISIONAL DEBRIDEMENT OF SKIN 3RD, 4TH AND 5TH TOES WITH APPLICATION OF APLIGRAFT;  Surgeon: Recardo Evangelist, DPM;  Location: [facility] ORS;  Service: Podiatry;  Laterality: Left;  necrotic,gangrene   IRRIGATION AND DEBRIDEMENT FOOT Left 12/09/2016   Procedure: IRRIGATION AND DEBRIDEMENT FOOT-MUSCLE/FASCIA, RESECTION OF FOURTH AND FIFTH METATARSAL NECROTIC BONE;  Surgeon: Recardo Evangelist, DPM;  Location: [facility] OR[facility];  Service: Podiatry;  Laterality: Left;   IRRIGATION AND DEBRIDEMENT FOOT Right 12/23/2018   Procedure: IRRIGATION AND DEBRIDEMENT FOOT and wound vac application;  Surgeon: Gwyneth Revels, DPM;  Location: [facility];  Service: Podiatry;  Laterality: Right;   LEFT HEART CATH N/A 06/25/2019   Procedure: Left Heart Cath and Coronary Angiography;  Surgeon: Marcina Millard, MD;  Location: [facility] INVASIVE CV LAB;  Service: Cardiovascular;  Laterality: N/A;   LEFT HEART CATH AND CORONARY ANGIOGRAPHY N/A 06/05/2018   Procedure: LEFT HEART CATH AND CORONARY ANGIOGRAPHY;  Surgeon: Alwyn Pea, MD;  Location: [facility] LAB;  Service: Cardiovascular;  Laterality: N/A;   LOWER EXTREMITY ANGIOGRAPHY Left 08/16/2016   Procedure: Lower Extremity Angiography;  Surgeon: Annice Needy, MD;  Location: [facility];  Service: Cardiovascular;  Laterality: Left;   LOWER EXTREMITY ANGIOGRAPHY Left 09/01/2016   Procedure: Lower Extremity Angiography;  Surgeon: Annice Needy, MD;  Location: [facility];  Service: Cardiovascular;  Laterality: Left;   LOWER EXTREMITY ANGIOGRAPHY Left 10/14/2016   Procedure: Lower Extremity Angiography;  Surgeon: Annice Needy, MD;  Location: [facility];  Service: Cardiovascular;  Laterality: Left;   LOWER  EXTREMITY ANGIOGRAPHY Left 12/20/2016   Procedure: Lower Extremity Angiography;  Surgeon: Annice Needy, MD;  Location: [facility];  Service: Cardiovascular;  Laterality: Left;   LOWER EXTREMITY ANGIOGRAPHY Right 12/18/2018   Procedure: LOWER EXTREMITY ANGIOGRAPHY;  Surgeon: Annice Needy, MD;  Location: [facility];  Service: Cardiovascular;  Laterality: Right;   LOWER EXTREMITY INTERVENTION  09/01/2016   Procedure: Lower Extremity Intervention;  Surgeon: Annice Needy, MD;  Location: [facility] LAB;  Service: Cardiovascular;;   PROSTATE SURGERY     removal   TONSILLECTOMY     as a child     A IV Location/Drains/Wounds Patient Lines/Drains/Airways Status     Active Line/Drains/Airways     Name Placement date Placement time Site Days   Peripheral IV 03/20/23 18 G 1" Left Antecubital 03/20/23  1053  Antecubital  less than 1   Urethral Catheter JAC, RN Temperature probe;Double-lumen;Straight-tip 16 Fr. 03/20/23  1225  Temperature probe;Double-lumen;Straight-tip  less than 1            Intake/Output Last 24 hours  Intake/Output Summary (Last 24 hours) at 03/20/2023 1636 Last data filed at 03/20/2023 1204 Gross per 24 hour  Intake 1200 ml  Output --  Net 1200 ml    Labs/Imaging Results for orders placed or performed during the hospital encounter of 03/20/23 (from the past 48  hour(s))  Blood gas, venous     Status: Abnormal   Collection Time: 03/20/23 10:34 AM  Result Value Ref Range   pH, Ven 7.42 7.25 - 7.43   pCO2, Ven 36 (L) 44 - 60 mmHg   pO2, Ven <31 (LL) 32 - 45 mmHg   Bicarbonate 23.4 20.0 - 28.0 mmol/L   Acid-base deficit 0.7 0.0 - 2.0 mmol/L   O2 Saturation 33 %   Patient temperature 37.0    Collection site VENOUS     Comment: Performed at [facility], [address] [place], [place] [address]  Urinalysis, w/ Reflex to Culture (Infection Suspected) -Urine, Catheterized     Status: Abnormal   Collection Time: 03/20/23 10:34 AM   Result Value Ref Range   Specimen Source URINE, RANDOM     Comment: CORRECTED ON 10/27 AT 1240: PREVIOUSLY REPORTED AS URINE, CATHETERIZED   Color, Urine YELLOW (A) YELLOW   APPearance HAZY (A) CLEAR   Specific Gravity, Urine 1.014 1.005 - 1.030   pH 5.0 5.0 - 8.0   Glucose, UA NEGATIVE NEGATIVE mg/dL   Hgb urine dipstick MODERATE (A) NEGATIVE   Bilirubin Urine NEGATIVE NEGATIVE   Ketones, ur NEGATIVE NEGATIVE mg/dL   Protein, ur 30 (A) NEGATIVE mg/dL   Nitrite NEGATIVE NEGATIVE   Leukocytes,Ua NEGATIVE NEGATIVE   RBC / HPF 6-10 0 - 5 RBC/hpf   WBC, UA 6-10 0 - 5 WBC/hpf    Comment:        Reflex urine culture not performed if WBC <=10, OR if Squamous epithelial cells >5. If Squamous epithelial cells >5 suggest recollection.    Bacteria, UA RARE (A) NONE SEEN   Squamous Epithelial / HPF 0-5 0 - 5 /HPF   Mucus PRESENT    Hyaline Casts, UA PRESENT     Comment: Performed at [facility], [address] [place], [place] [address]  Lactic acid, plasma     Status: Abnormal   Collection Time: 03/20/23 10:46 AM  Result Value Ref Range   Lactic Acid, Venous 4.5 (HH) 0.5 - 1.9 mmol/L    Comment: CRITICAL RESULT CALLED TO, READ BACK BY AND VERIFIED WITH JON Miosha Behe @1203  03/20/23 MJU Performed at [facility], [address] [place], [place] [address]   Comprehensive metabolic panel     Status: Abnormal   Collection Time: 03/20/23 10:46 AM  Result Value Ref Range   Sodium 143 135 - 145 mmol/L   Potassium 3.9 3.5 - 5.1 mmol/L   Chloride 103 98 - 111 mmol/L   CO2 23 22 - 32 mmol/L   Glucose, Bld 322 (H) 70 - 99 mg/dL    Comment: Glucose reference range applies only to samples taken after fasting for at least 8 hours.   BUN 105 (H) 8 - 23 mg/dL    Comment: RESULT CONFIRMED BY MANUAL DILUTION MJU   Creatinine, Ser 4.86 (H) 0.61 - 1.24 mg/dL   Calcium 8.7 (L) 8.9 - 10.3 mg/dL   Total Protein 7.7 6.5 - 8.1 g/dL   Albumin 3.1 (L) 3.5 - 5.0 g/dL   AST 60 (H) 15 -  41 U/L   ALT 36 0 - 44 U/L   Alkaline Phosphatase 130 (H) 38 - 126 U/L   Total Bilirubin 0.8 0.3 - 1.2 mg/dL   GFR, Estimated 11 (L) >60 mL/min    Comment: (NOTE) Calculated using the CKD-EPI Creatinine Equation (2021)    Anion gap 17 (H) 5 - 15  Comment: Performed at [facility], [address] [place], [place] [address]  CBC with Differential     Status: Abnormal   Collection Time: 03/20/23 10:46 AM  Result Value Ref Range   WBC 14.8 (H) 4.0 - 10.5 K/uL   RBC 5.20 4.22 - 5.81 MIL/uL   Hemoglobin 15.3 13.0 - 17.0 g/dL   HCT 95.6 21.3 - 08.6 %   MCV 87.1 80.0 - 100.0 fL   MCH 29.4 26.0 - 34.0 pg   MCHC 33.8 30.0 - 36.0 g/dL   RDW 57.8 46.9 - 62.9 %   Platelets 316 150 - 400 K/uL   nRBC 0.0 0.0 - 0.2 %   Neutrophils Relative % 83 %   Neutro Abs 12.3 (H) 1.7 - 7.7 K/uL   Lymphocytes Relative 5 %   Lymphs Abs 0.7 0.7 - 4.0 K/uL   Monocytes Relative 11 %   Monocytes Absolute 1.7 (H) 0.1 - 1.0 K/uL   Eosinophils Relative 0 %   Eosinophils Absolute 0.0 0.0 - 0.5 K/uL   Basophils Relative 0 %   Basophils Absolute 0.0 0.0 - 0.1 K/uL   Immature Granulocytes 1 %   Abs Immature Granulocytes 0.09 (H) 0.00 - 0.07 K/uL    Comment: Performed at [facility], [address] [place], [place] [address]  Protime-INR     Status: Abnormal   Collection Time: 03/20/23 10:46 AM  Result Value Ref Range   Prothrombin Time 18.9 (H) 11.4 - 15.2 seconds   INR 1.6 (H) 0.8 - 1.2    Comment: (NOTE) INR goal varies based on device and disease states. Performed at [facility], [address] [place], [place] [address]   APTT     Status: None   Collection Time: 03/20/23 10:46 AM  Result Value Ref Range   aPTT 25 24 - 36 seconds    Comment: Performed at [facility], [address] [place], [place] [address]  Lipase, blood     Status: None   Collection Time: 03/20/23 10:46 AM  Result Value Ref Range   Lipase 41 11 - 51 U/L    Comment: Performed at  [facility], [address] [place], [place] [address]  Troponin I (High Sensitivity)     Status: Abnormal   Collection Time: 03/20/23 10:46 AM  Result Value Ref Range   Troponin I (High Sensitivity) 377 (HH) <18 ng/L    Comment: CRITICAL RESULT CALLED TO, READ BACK BY AND VERIFIED WITH JON Eusebio Blazejewski @1203  03/20/23 MJU (NOTE) Elevated high sensitivity troponin I (hsTnI) values and significant  changes across serial measurements may suggest ACS but many other  chronic and acute conditions are known to elevate hsTnI results.  Refer to the "Links" section for chest pain algorithms and additional  guidance. Performed at [facility], [address] [place], [place] [address]   Resp panel by RT-PCR (RSV, Flu A&B, Covid) Anterior Nasal Swab     Status: None   Collection Time: 03/20/23 10:48 AM   Specimen: Anterior Nasal Swab  Result Value Ref Range   SARS Coronavirus 2 by RT PCR NEGATIVE NEGATIVE    Comment: (NOTE) SARS-CoV-2 target nucleic acids are NOT DETECTED.  The SARS-CoV-2 RNA is generally detectable in upper respiratory specimens during the acute phase of infection. The lowest concentration of SARS-CoV-2 viral copies this assay can detect is 138 copies/mL. A negative result does not preclude SARS-Cov-2 infection and should not be used as the sole basis for  treatment or other patient management decisions. A negative result may occur with  improper specimen collection/handling, submission of specimen other than nasopharyngeal swab, presence of viral mutation(s) within the areas targeted by this assay, and inadequate number of viral copies(<138 copies/mL). A negative result must be combined with clinical observations, patient history, and epidemiological information. The expected result is Negative.  Fact Sheet for Patients:  BloggerCourse.com  Fact Sheet for Healthcare Providers:  SeriousBroker.it  This test  is no t yet approved or cleared by the [place] FDA and  has been authorized for detection and/or diagnosis of SARS-CoV-2 by FDA under an Emergency Use Authorization (EUA). This EUA will remain  in effect (meaning this test can be used) for the duration of the COVID-19 declaration under Section 564(b)(1) of the Act, 21 U.S.C.section 360bbb-3(b)(1), unless the authorization is terminated  or revoked sooner.       Influenza A by PCR NEGATIVE NEGATIVE   Influenza B by PCR NEGATIVE NEGATIVE    Comment: (NOTE) The Xpert Xpress SARS-CoV-2/FLU/RSV plus assay is intended as an aid in the diagnosis of influenza from Nasopharyngeal swab specimens and should not be used as a sole basis for treatment. Nasal washings and aspirates are unacceptable for Xpert Xpress SARS-CoV-2/FLU/RSV testing.  Fact Sheet for Patients: BloggerCourse.com  Fact Sheet for Healthcare Providers: SeriousBroker.it  This test is not yet approved or cleared by the [place] FDA and has been authorized for detection and/or diagnosis of SARS-CoV-2 by FDA under an Emergency Use Authorization (EUA). This EUA will remain in effect (meaning this test can be used) for the duration of the COVID-19 declaration under Section 564(b)(1) of the Act, 21 U.S.C. section 360bbb-3(b)(1), unless the authorization is terminated or revoked.     Resp Syncytial Virus by PCR NEGATIVE NEGATIVE    Comment: (NOTE) Fact Sheet for Patients: BloggerCourse.com  Fact Sheet for Healthcare Providers: SeriousBroker.it  This test is not yet approved or cleared by the [place] FDA and has been authorized for detection and/or diagnosis of SARS-CoV-2 by FDA under an Emergency Use Authorization (EUA). This EUA will remain in effect (meaning this test can be used) for the duration of the COVID-19 declaration under Section 564(b)(1) of the  Act, 21 U.S.C. section 360bbb-3(b)(1), unless the authorization is terminated or revoked.  Performed at [facility], [address] [place], [place] [address]   Lactic acid, plasma     Status: Abnormal   Collection Time: 03/20/23 12:34 PM  Result Value Ref Range   Lactic Acid, Venous 2.7 (HH) 0.5 - 1.9 mmol/L    Comment: CRITICAL VALUE NOTED. VALUE IS CONSISTENT WITH PREVIOUSLY REPORTED/CALLED VALUE MJU Performed at [facility], [address] [place], [place] [address]   Troponin I (High Sensitivity)     Status: Abnormal   Collection Time: 03/20/23 12:34 PM  Result Value Ref Range   Troponin I (High Sensitivity) 321 (HH) <18 ng/L    Comment: CRITICAL VALUE NOTED. VALUE IS CONSISTENT WITH PREVIOUSLY REPORTED/CALLED VALUE MJU (NOTE) Elevated high sensitivity troponin I (hsTnI) values and significant  changes across serial measurements may suggest ACS but many other  chronic and acute conditions are known to elevate hsTnI results.  Refer to the "Links" section for chest pain algorithms and additional  guidance. Performed at [facility], [address] [place], [place] [address]   CBG monitoring, ED     Status: Abnormal   Collection Time: 03/20/23  4:20 PM  Result Value Ref Range  Glucose-Capillary 179 (H) 70 - 99 mg/dL    Comment: Glucose reference range applies only to samples taken after fasting for at least 8 hours.   CT ABDOMEN PELVIS WO CONTRAST  Result Date: 03/20/2023 CLINICAL DATA:  Sepsis.  Acute abdominal pain. EXAM: CT ABDOMEN AND PELVIS WITHOUT CONTRAST TECHNIQUE: Multidetector CT imaging of the abdomen and pelvis was performed following the standard protocol without IV contrast. RADIATION DOSE REDUCTION: This exam was performed according to the departmental dose-optimization program which includes automated exposure control, adjustment of the mA and/or kV according to patient size and/or use of iterative reconstruction technique.  COMPARISON:  11/30/2019. FINDINGS: Lower chest: Airway thickening is present with airway plugging in both lower lobes. Mild cardiomegaly is present with aortic valve calcification. There is aortic, left anterior descending, right, and circumflex coronary atheromatous vascular disease. Hepatobiliary: Cholecystectomy.  Otherwise unremarkable. Pancreas: Unremarkable Spleen: Unremarkable Adrenals/Urinary Tract: Foley catheter in the urinary bladder with a small amount of gas in the urinary bladder. Vascular calcifications along the renal hila. Relative left renal atrophy. 1.2 cm lesion of the left kidney upper pole with internal density 40 Hounsfield units, previously 90 Hounsfield units on noncontrast CT, probably a hemorrhagic cyst, although enhancement characteristics are not interrogated hence this lesion is technically indeterminate. Suspect 8 mm in long axis right mid kidney nonobstructive renal calculus on image 38 series 2. Stomach/Bowel: Small lipoma in the transverse duodenum. Transverse duodenal diverticulum. Chronic prominence of stool in the rectal vault. There are few scattered sigmoid colon diverticula. No dilated bowel. Vascular/Lymphatic: Atherosclerosis is present, including aortoiliac atherosclerotic disease. Extensive atheromatous calcification in the celiac track and superior mesenteric artery. Patency is not assessed on today's noncontrast examination. Reproductive: Prostatectomy. Other: No supplemental non-categorized findings. Musculoskeletal: Right hip hemiarthroplasty. Bony demineralization. Lumbar and lower thoracic spondylosis. Small Morgagni hernia containing adipose tissue. IMPRESSION: 1. Airway thickening with airway plugging in both lower lobes, compatible with bronchitis or reactive airways disease. 2. Mild cardiomegaly with aortic valve calcification. 3. Aortic, left anterior descending, right, and circumflex coronary atheromatous vascular disease. Extensive systemic atherosclerosis  including involvement of the superior mesenteric artery and celiac trunk. Patency not assessed on today's noncontrast exam. 4. Relative left renal atrophy. 5. 1.2 cm lesion of the left kidney upper pole with internal density 40 Hounsfield units, previously 90 Hounsfield units on noncontrast CT, probably a hemorrhagic cyst, although enhancement characteristics are not interrogated hence this lesion is technically indeterminate. Consider renal protocol MRI with and without contrast for definitive characterization. 6. Suspect 8 mm in long axis right mid kidney nonobstructive renal calculus. 7. Chronic prominence of stool in the rectal vault. 8. Small Morgagni hernia containing adipose tissue. 9. Bony demineralization. Lumbar and lower thoracic spondylosis. Aortic Atherosclerosis (ICD10-I70.0). Electronically Signed   By: Gaylyn Rong M.D.   On: 03/20/2023 13:08   CT Head Wo Contrast  Result Date: 03/20/2023 CLINICAL DATA:  88 year old male altered mental status. EXAM: CT HEAD WITHOUT CONTRAST TECHNIQUE: Contiguous axial images were obtained from the base of the skull through the vertex without intravenous contrast. RADIATION DOSE REDUCTION: This exam was performed according to the departmental dose-optimization program which includes automated exposure control, adjustment of the mA and/or kV according to patient size and/or use of iterative reconstruction technique. COMPARISON:  Brain MRI 03/06/2021.  Head CT 04/17/2021. FINDINGS: Brain: Stable cerebral volume. No midline shift, mass effect, or evidence of intracranial mass lesion. Stable ex vacuo appearing ventricular enlargement. Chronic dural calcification. No acute intracranial hemorrhage identified. Chronic deep cerebellar nuclei dystrophic  calcification. No cortically based acute infarct identified. Patchy chronic white matter hypodensity. Vascular: Extensive Calcified atherosclerosis at the skull base. No suspicious intracranial vascular hyperdensity.  Skull: No acute osseous abnormality identified. Sinuses/Orbits: New bilateral paranasal sinus inflammation with mucosal thickening and bubbly opacity, most pronounced in the maxillary sinuses. Tympanic cavities and mastoids appear to remain clear. Other: Calcified scalp vessel atherosclerosis. No acute orbit or scalp soft tissue finding. IMPRESSION: 1. No acute intracranial abnormality. Stable non contrast CT appearance of cerebral volume loss and white matter disease. 2. Bilateral paranasal sinus inflammation, new since 2022. Consider acute sinusitis. Electronically Signed   By: Odessa Fleming M.D.   On: 03/20/2023 12:54   DG Chest Port 1 View  Result Date: 03/20/2023 CLINICAL DATA:  Sepsis, atrial fibrillation EXAM: PORTABLE CHEST - 1 VIEW COMPARISON:  02/17/2023 FINDINGS: Low lung volumes with bronchovascular crowding in the lung bases. Heart size and mediastinal contours are within normal limits. Aortic Atherosclerosis (ICD10-170.0). No effusion. Visualized bones unremarkable. IMPRESSION: Low volumes. No acute findings. Electronically Signed   By: Corlis Leak M.D.   On: 03/20/2023 11:07    Pending Labs Unresulted Labs (From admission, onward)     Start     Ordered   03/21/23 0500  CBC with Differential/Platelet  Tomorrow morning,   R        03/20/23 1533   03/21/23 0500  Comprehensive metabolic panel  Tomorrow morning,   R        03/20/23 1533   03/21/23 0500  Hemoglobin A1c  Tomorrow morning,   R       Comments: To assess prior glycemic control    03/20/23 1539   03/21/23 0300  APTT  Once-Timed,   TIMED        03/20/23 1614   03/20/23 1540  Heparin level (unfractionated)  ONCE - URGENT,   URGENT        03/20/23 1539   03/20/23 1034  Blood Culture (routine x 2)  (Septic presentation on arrival (screening labs, nursing and treatment orders for obvious sepsis))  BLOOD CULTURE X 2,   STAT      03/20/23 1034            Vitals/Pain Today's Vitals   03/20/23 1500 03/20/23 1515 03/20/23 1530  03/20/23 1600  BP: (!) 142/101 (!) 130/102 (!) 141/102 (!) 143/108  Pulse:      Resp: (!) 25 (!) 25 (!) 25 (!) 25  Temp: 100.2 F (37.9 C) 100.3 F (37.9 C) 100.3 F (37.9 C) 100.1 F (37.8 C)  TempSrc:      SpO2: 92% 92% 90% 92%  Weight:        Isolation Precautions No active isolations  Medications Medications  0.9 %  sodium chloride infusion ( Intravenous Restarted 03/20/23 1259)  metoprolol tartrate (LOPRESSOR) injection 5 mg (5 mg Intravenous Given 03/20/23 1426)  insulin glargine-yfgn (SEMGLEE) injection 25 Units (has no administration in time range)  insulin aspart (novoLOG) injection 0-9 Units (2 Units Subcutaneous Given 03/20/23 1628)  heparin bolus via infusion 4,000 Units (has no administration in time range)    Followed by  heparin ADULT infusion 100 units/mL (25000 units/280mL) (has no administration in time range)  sodium chloride 0.9 % bolus 1,000 mL (0 mLs Intravenous Stopped 03/20/23 1203)  cefTRIAXone (ROCEPHIN) 2 g in sodium chloride 0.9 % 100 mL IVPB (0 g Intravenous Stopped 03/20/23 1203)  metroNIDAZOLE (FLAGYL) IVPB 500 mg (0 mg Intravenous Stopped 03/20/23 1204)  lidocaine (XYLOCAINE) 2 % jelly  1 Application (1 Application Urethral Given 03/20/23 1224)  acetaminophen (OFIRMEV) IV 1,000 mg (0 mg Intravenous Stopped 03/20/23 1342)  fentaNYL (SUBLIMAZE) injection 100 mcg (100 mcg Intravenous Given 03/20/23 1439)    Mobility **Unable/unknown:  BLE BKA     Focused Assessments Neuro Assessment Handoff:  Swallow screen pass? No  Cardiac Rhythm: Atrial fibrillation (HR 136)       Neuro Assessment: Exceptions to [facility] Neuro Checks:      Has TPA been given? No If patient is a Neuro Trauma and patient is going to OR before floor call report to 4N Charge nurse: [phone] or [phone]   R Recommendations: See Admitting Provider Note  Report given to:   Additional Notes: GCS 9-11, recent fentanyl given, calmer now, afib RVR 127-138, metoprolol  given (Q8hrs), IV tylenol given...here for AMS/ abd pain, son noticed changes over last 2 weeks, facility reported changes yesterday, was restless, now calm. Trop, sugar and lactic were elevated...improved

## 2023-03-20 NOTE — Progress Notes (Signed)
[facility]link following for sepsis protocol. 

## 2023-03-21 ENCOUNTER — Inpatient Hospital Stay: Payer: Medicare Other

## 2023-03-21 DIAGNOSIS — Z89511 Acquired absence of right leg below knee: Secondary | ICD-10-CM

## 2023-03-21 DIAGNOSIS — N179 Acute kidney failure, unspecified: Secondary | ICD-10-CM

## 2023-03-21 DIAGNOSIS — Z89512 Acquired absence of left leg below knee: Secondary | ICD-10-CM

## 2023-03-21 DIAGNOSIS — N1832 Chronic kidney disease, stage 3b: Secondary | ICD-10-CM

## 2023-03-21 DIAGNOSIS — E872 Acidosis, unspecified: Secondary | ICD-10-CM

## 2023-03-21 DIAGNOSIS — A419 Sepsis, unspecified organism: Principal | ICD-10-CM

## 2023-03-21 DIAGNOSIS — R7989 Other specified abnormal findings of blood chemistry: Secondary | ICD-10-CM | POA: Diagnosis not present

## 2023-03-21 DIAGNOSIS — I4891 Unspecified atrial fibrillation: Secondary | ICD-10-CM

## 2023-03-21 DIAGNOSIS — E1122 Type 2 diabetes mellitus with diabetic chronic kidney disease: Secondary | ICD-10-CM

## 2023-03-21 DIAGNOSIS — G9340 Encephalopathy, unspecified: Secondary | ICD-10-CM

## 2023-03-21 DIAGNOSIS — R6520 Severe sepsis without septic shock: Secondary | ICD-10-CM

## 2023-03-21 DIAGNOSIS — I739 Peripheral vascular disease, unspecified: Secondary | ICD-10-CM

## 2023-03-21 DIAGNOSIS — I5022 Chronic systolic (congestive) heart failure: Secondary | ICD-10-CM

## 2023-03-21 DIAGNOSIS — Z794 Long term (current) use of insulin: Secondary | ICD-10-CM

## 2023-03-21 DIAGNOSIS — I10 Essential (primary) hypertension: Secondary | ICD-10-CM

## 2023-03-21 LAB — COMPREHENSIVE METABOLIC PANEL
ALT: 30 U/L (ref 0–44)
AST: 47 U/L — ABNORMAL HIGH (ref 15–41)
Albumin: 2.5 g/dL — ABNORMAL LOW (ref 3.5–5.0)
Alkaline Phosphatase: 110 U/L (ref 38–126)
Anion gap: 13 (ref 5–15)
BUN: 108 mg/dL — ABNORMAL HIGH (ref 8–23)
CO2: 22 mmol/L (ref 22–32)
Calcium: 7.8 mg/dL — ABNORMAL LOW (ref 8.9–10.3)
Chloride: 114 mmol/L — ABNORMAL HIGH (ref 98–111)
Creatinine, Ser: 4.26 mg/dL — ABNORMAL HIGH (ref 0.61–1.24)
GFR, Estimated: 13 mL/min — ABNORMAL LOW (ref >60)
Glucose, Bld: 180 mg/dL — ABNORMAL HIGH (ref 70–99)
Potassium: 3.6 mmol/L (ref 3.5–5.1)
Sodium: 149 mmol/L — ABNORMAL HIGH (ref 135–145)
Total Bilirubin: 0.8 mg/dL (ref 0.3–1.2)
Total Protein: 6.2 g/dL — ABNORMAL LOW (ref 6.5–8.1)

## 2023-03-21 LAB — CBC WITH DIFFERENTIAL/PLATELET
Abs Immature Granulocytes: 0.13 10*3/uL — ABNORMAL HIGH (ref 0.00–0.07)
Basophils Absolute: 0 10*3/uL (ref 0.0–0.1)
Basophils Relative: 0 %
Eosinophils Absolute: 0 10*3/uL (ref 0.0–0.5)
Eosinophils Relative: 0 %
HCT: 43.4 % (ref 39.0–52.0)
Hemoglobin: 14.1 g/dL (ref 13.0–17.0)
Immature Granulocytes: 1 %
Lymphocytes Relative: 6 %
Lymphs Abs: 1.2 10*3/uL (ref 0.7–4.0)
MCH: 29.6 pg (ref 26.0–34.0)
MCHC: 32.5 g/dL (ref 30.0–36.0)
MCV: 91 fL (ref 80.0–100.0)
Monocytes Absolute: 2.2 10*3/uL — ABNORMAL HIGH (ref 0.1–1.0)
Monocytes Relative: 11 %
Neutro Abs: 15.8 10*3/uL — ABNORMAL HIGH (ref 1.7–7.7)
Neutrophils Relative %: 82 %
Platelets: 217 10*3/uL (ref 150–400)
RBC: 4.77 MIL/uL (ref 4.22–5.81)
RDW: 14.7 % (ref 11.5–15.5)
WBC: 19.3 10*3/uL — ABNORMAL HIGH (ref 4.0–10.5)
nRBC: 0 % (ref 0.0–0.2)

## 2023-03-21 LAB — PROCALCITONIN: Procalcitonin: 0.4 ng/mL

## 2023-03-21 LAB — HEPARIN LEVEL (UNFRACTIONATED)
Heparin Unfractionated: 0.93 [IU]/mL — ABNORMAL HIGH (ref 0.30–0.70)
Heparin Unfractionated: 0.81 [IU]/mL — ABNORMAL HIGH (ref 0.30–0.70)

## 2023-03-21 LAB — CBC
HCT: 42 % (ref 39.0–52.0)
Hemoglobin: 13.3 g/dL (ref 13.0–17.0)
MCH: 29 pg (ref 26.0–34.0)
MCHC: 31.7 g/dL (ref 30.0–36.0)
MCV: 91.7 fL (ref 80.0–100.0)
Platelets: 220 10*3/uL (ref 150–400)
RBC: 4.58 MIL/uL (ref 4.22–5.81)
RDW: 15 % (ref 11.5–15.5)
WBC: 20.1 10*3/uL — ABNORMAL HIGH (ref 4.0–10.5)
nRBC: 0 % (ref 0.0–0.2)

## 2023-03-21 LAB — GLUCOSE, CAPILLARY
Glucose-Capillary: 193 mg/dL — ABNORMAL HIGH (ref 70–99)
Glucose-Capillary: 193 mg/dL — ABNORMAL HIGH (ref 70–99)
Glucose-Capillary: 163 mg/dL — ABNORMAL HIGH (ref 70–99)
Glucose-Capillary: 177 mg/dL — ABNORMAL HIGH (ref 70–99)
Glucose-Capillary: 180 mg/dL — ABNORMAL HIGH (ref 70–99)

## 2023-03-21 LAB — APTT
aPTT: 94 s — ABNORMAL HIGH (ref 24–36)
aPTT: 112 s — ABNORMAL HIGH (ref 24–36)
aPTT: 99 s — ABNORMAL HIGH (ref 24–36)

## 2023-03-21 LAB — HEMOGLOBIN A1C
Hgb A1c MFr Bld: 8.1 % — ABNORMAL HIGH (ref 4.8–5.6)
Mean Plasma Glucose: 185.77 mg/dL

## 2023-03-21 LAB — TROPONIN I (HIGH SENSITIVITY)
Troponin I (High Sensitivity): 226 ng/L (ref <18)
Troponin I (High Sensitivity): 214 ng/L (ref <18)

## 2023-03-21 LAB — BRAIN NATRIURETIC PEPTIDE: B Natriuretic Peptide: 2700.2 pg/mL — ABNORMAL HIGH (ref 0.0–100.0)

## 2023-03-21 MED ORDER — AMIODARONE HCL IN DEXTROSE 360-4.14 MG/200ML-% IV SOLN
60.0000 mg/h | INTRAVENOUS | Status: AC
  Administered 2023-03-21 (×2): 60 mg/h via INTRAVENOUS
  Filled 2023-03-21 (×2): qty 200

## 2023-03-21 MED ORDER — DILTIAZEM HCL 25 MG/5ML IV SOLN
10.0000 mg | Freq: Once | INTRAVENOUS | Status: AC
  Administered 2023-03-21: 10 mg via INTRAVENOUS
  Filled 2023-03-21: qty 5

## 2023-03-21 MED ORDER — AMIODARONE HCL IN DEXTROSE 360-4.14 MG/200ML-% IV SOLN
30.0000 mg/h | INTRAVENOUS | Status: DC
  Administered 2023-03-21 – 2023-03-22 (×3): 30 mg/h via INTRAVENOUS
  Filled 2023-03-21 (×2): qty 200

## 2023-03-21 MED ORDER — LACTATED RINGERS IV SOLN: INTRAVENOUS | Status: DC

## 2023-03-21 MED ORDER — AMIODARONE LOAD VIA INFUSION
150.0000 mg | Freq: Once | INTRAVENOUS | Status: AC
  Administered 2023-03-21: 150 mg via INTRAVENOUS
  Filled 2023-03-21: qty 83.34

## 2023-03-21 MED ORDER — IPRATROPIUM-ALBUTEROL 0.5-2.5 (3) MG/3ML IN SOLN
3.0000 mL | RESPIRATORY_TRACT | Status: DC | PRN
  Administered 2023-03-21: 3 mL via RESPIRATORY_TRACT
  Filled 2023-03-21: qty 3

## 2023-03-21 MED ORDER — CHLORHEXIDINE GLUCONATE CLOTH 2 % EX PADS
6.0000 | MEDICATED_PAD | Freq: Every day | CUTANEOUS | Status: DC
Start: 2023-03-21 — End: 2023-03-22
  Administered 2023-03-21 – 2023-03-22 (×2): 6 via TOPICAL

## 2023-03-21 MED ORDER — SODIUM CHLORIDE 0.9 % IV SOLN
1.0000 g | INTRAVENOUS | Status: DC
  Administered 2023-03-21 – 2023-03-22 (×2): 1 g via INTRAVENOUS
  Filled 2023-03-21 (×2): qty 10

## 2023-03-21 MED ORDER — LORAZEPAM 2 MG/ML IJ SOLN
2.0000 mg | Freq: Once | INTRAMUSCULAR | Status: AC
  Administered 2023-03-21: 2 mg via INTRAVENOUS
  Filled 2023-03-21: qty 1

## 2023-03-21 MED ORDER — MORPHINE SULFATE (PF) 2 MG/ML IV SOLN
1.0000 mg | INTRAVENOUS | Status: DC | PRN
  Administered 2023-03-21 – 2023-03-22 (×3): 1 mg via INTRAVENOUS
  Filled 2023-03-21 (×3): qty 1

## 2023-03-21 MED ORDER — LORAZEPAM 2 MG/ML IJ SOLN
0.5000 mg | INTRAMUSCULAR | Status: DC | PRN
  Administered 2023-03-21: 0.5 mg via INTRAVENOUS
  Filled 2023-03-21: qty 1

## 2023-03-21 NOTE — Progress Notes (Signed)
OT Cancellation Note  Patient Details Name: [name] MRN: [id number] DOB: [birth year]   Cancelled Treatment:    Reason Eval/Treat Not Completed: Patient not medically ready. Consult received, chart reviewed. Pt received Ativan this morning. Per RN, pt unable to follow commands and recommends deferring therapy at least another day. Will hold today and follow acutely for appropriateness for OT evaluation.   Arman Filter., MPH, MS, OTR/L ascom [phone] 03/21/23, 8:19 AM

## 2023-03-21 NOTE — Assessment & Plan Note (Signed)
Likely demand ischemia with persistent A-fib with RVR. -Continue to monitor

## 2023-03-21 NOTE — Consult Note (Signed)
[facility] CLINIC CARDIOLOGY CONSULT NOTE       Patient ID: [name] MRN: [id number] DOB/AGE: [birth year] 86 y.o.  Admit date: 03/15/2023 Referring Physician Dr. Arnetha Courser Primary Physician Luciana Axe, NP  Primary Cardiologist Dr. Juliann Pares Reason for Consultation atrial fibrillation RVR  HPI: [name] is a 88 y.o. male  with a past medical history of paroxysmal AF (Xarelto), chronic HFrEF (35-40%, rWMAs 07/2022), 1vCAD (CTO ostial LAD, 06/2019), CKD III, DM2, hx bilat BKA who presented to the ED on 03/16/2023 for altered mental status.  Found to be septic in the ED, noted atrial fibrillation RVR on EKG and telemetry.  Cardiology was consulted for further evaluation.   Patient was brought to the ED yesterday from care facility due to altered mental status.  Son reported that he had been at baseline mental status throughout the week but reportedly last night became altered.  Also was reportedly complaining abdominal pain throughout the week.  Brought to the ED by EMS.  Workup in the ED notable for creatinine 4.86, potassium 3.9, hemoglobin 15.3, WBC 14.8, lactic acid 4.5.  Patient also noted to be tachycardic and tachypneic in the ED, urinalysis not concerning for UTI.  Chest x-ray was nonacute but CT AP did demonstrate probable bronchitis and thus he was started on IV antibiotics.  He was given fluid bolus in the ED as well.  At the time of my evaluation this morning patient is resting in hospital bed, he is somnolent.  History limited, majority of history obtained via chart review given patient's mental status. Patient somnolent on exam due to receiving ativan this AM for agitation. He remained tachycardic overnight and into this morning, we were asked to see the patient to help with rate control.  Patient has a history of atrial fibrillation, he was seen during hospitalization by our group from 9/26 to 10/3 for this and was initiated on sotalol for rhythm control  but this was discontinued due to QTc prolongation.  He was seen in the office on 10/15 and rate was controlled at this time.  He now sustaining an atrial fibrillation RVR despite IV diltiazem and metoprolol.  Review of systems complete and found to be negative unless listed above    Past Medical History:  Diagnosis Date   Acute respiratory failure with hypoxia (HCC) 02/08/2019   Arthritis    Atrial fibrillation (HCC)    Carcinoma of prostate (HCC)    CHF (congestive heart failure) (HCC)    Chronic kidney disease    Diabetes mellitus without complication (HCC)    ED (erectile dysfunction)    Frequent urination    Hyperlipidemia    Hypertension    Leukocytosis 09/06/2016   Moderate mitral insufficiency    Myocardial injury 08/14/2022   Peripheral vascular disease (HCC)    Pneumonia 04/2016   Prostate cancer (HCC)    Sleep apnea    OSA--USE C-PAP   Ulcer of left foot due to type 2 diabetes mellitus (HCC)    Urinary stress incontinence, male     Past Surgical History:  Procedure Laterality Date   AMPUTATION Left 01/12/2017   Procedure: AMPUTATION BELOW KNEE;  Surgeon: Annice Needy, MD;  Location: [facility] ORS;  Service: General;  Laterality: Left;   AMPUTATION Right 12/27/2018   Procedure: AMPUTATION BELOW KNEE;  Surgeon: Annice Needy, MD;  Location: [facility] ORS;  Service: General;  Laterality: Right;   APLIGRAFT PLACEMENT Left 10/15/2016   Procedure: APLIGRAFT PLACEMENT;  Surgeon: Recardo Evangelist,  DPM;  Location: [facility] ORS;  Service: Podiatry;  Laterality: Left;  necrotic ulcer   CHOLECYSTECTOMY     COLONOSCOPY WITH PROPOFOL N/A 01/02/2019   Procedure: COLONOSCOPY WITH PROPOFOL;  Surgeon: Midge Minium, MD;  Location: [facility] ENDOSCOPY;  Service: Endoscopy;  Laterality: N/A;   COLONOSCOPY WITH PROPOFOL N/A 01/05/2019   Procedure: COLONOSCOPY WITH PROPOFOL;  Surgeon: Midge Minium, MD;  Location: [facility] ENDOSCOPY;  Service: Endoscopy;  Laterality: N/A;   ESOPHAGOGASTRODUODENOSCOPY (EGD) WITH  PROPOFOL N/A 01/02/2019   Procedure: ESOPHAGOGASTRODUODENOSCOPY (EGD) WITH PROPOFOL;  Surgeon: Midge Minium, MD;  Location: [facility] ENDOSCOPY;  Service: Endoscopy;  Laterality: N/A;   EYE SURGERY Bilateral    Cataract Extraction with IOL   HIP ARTHROPLASTY Right 10/13/2018   Procedure: ARTHROPLASTY BIPOLAR HIP (HEMIARTHROPLASTY);  Surgeon: Deeann Saint, MD;  Location: [facility] ORS;  Service: Orthopedics;  Laterality: Right;   IRRIGATION AND DEBRIDEMENT FOOT Left 10/15/2016   Procedure: IRRIGATION AND DEBRIDEMENT FOOT-EXCISIONAL DEBRIDEMENT OF SKIN 3RD, 4TH AND 5TH TOES WITH APPLICATION OF APLIGRAFT;  Surgeon: Recardo Evangelist, DPM;  Location: [facility] ORS;  Service: Podiatry;  Laterality: Left;  necrotic,gangrene   IRRIGATION AND DEBRIDEMENT FOOT Left 12/09/2016   Procedure: IRRIGATION AND DEBRIDEMENT FOOT-MUSCLE/FASCIA, RESECTION OF FOURTH AND FIFTH METATARSAL NECROTIC BONE;  Surgeon: Recardo Evangelist, DPM;  Location: [facility] ORS;  Service: Podiatry;  Laterality: Left;   IRRIGATION AND DEBRIDEMENT FOOT Right 12/23/2018   Procedure: IRRIGATION AND DEBRIDEMENT FOOT and wound vac application;  Surgeon: Gwyneth Revels, DPM;  Location: [facility] ORS;  Service: Podiatry;  Laterality: Right;   LEFT HEART CATH N/A 06/25/2019   Procedure: Left Heart Cath and Coronary Angiography;  Surgeon: Marcina Millard, MD;  Location: [facility] INVASIVE CV LAB;  Service: Cardiovascular;  Laterality: N/A;   LEFT HEART CATH AND CORONARY ANGIOGRAPHY N/A 06/05/2018   Procedure: LEFT HEART CATH AND CORONARY ANGIOGRAPHY;  Surgeon: Alwyn Pea, MD;  Location: [facility] INVASIVE CV LAB;  Service: Cardiovascular;  Laterality: N/A;   LOWER EXTREMITY ANGIOGRAPHY Left 08/16/2016   Procedure: Lower Extremity Angiography;  Surgeon: Annice Needy, MD;  Location: [facility] INVASIVE CV LAB;  Service: Cardiovascular;  Laterality: Left;   LOWER EXTREMITY ANGIOGRAPHY Left 09/01/2016   Procedure: Lower Extremity Angiography;  Surgeon: Annice Needy, MD;  Location: [facility]  INVASIVE CV LAB;  Service: Cardiovascular;  Laterality: Left;   LOWER EXTREMITY ANGIOGRAPHY Left 10/14/2016   Procedure: Lower Extremity Angiography;  Surgeon: Annice Needy, MD;  Location: [facility] INVASIVE CV LAB;  Service: Cardiovascular;  Laterality: Left;   LOWER EXTREMITY ANGIOGRAPHY Left 12/20/2016   Procedure: Lower Extremity Angiography;  Surgeon: Annice Needy, MD;  Location: [facility] INVASIVE CV LAB;  Service: Cardiovascular;  Laterality: Left;   LOWER EXTREMITY ANGIOGRAPHY Right 12/18/2018   Procedure: LOWER EXTREMITY ANGIOGRAPHY;  Surgeon: Annice Needy, MD;  Location: [facility] INV[facility]IVE CV LAB;  Service: Cardiovascular;  Laterality: Right;   LOWER EXTREMITY INTERVENTION  09/01/2016   Procedure: Lower Extremity Intervention;  Surgeon: Annice Needy, MD;  Location: [facility] INVASIVE CV LAB;  Service: Cardiovascular;;   PROSTATE SURGERY     removal   TONSILLECTOMY     as a child    Medications Prior to Admission  Medication Sig Dispense Refill Last Dose   acetaminophen (TYLENOL) 500 MG tablet Take 500 mg by mouth every 8 (eight) hours as needed for mild pain (pain score 1-3).   03/21/2023 at 0525   aspirin 81 MG EC tablet Take 81 mg by mouth daily.   03/16/2023 at 0525   atorvastatin (LIPITOR)  80 MG tablet Take 80 mg by mouth at bedtime.   03/19/2023 at 1827   bisacodyl (DULCOLAX) 5 MG EC tablet Take 5 mg by mouth daily as needed for moderate constipation.   prn at unknown   bumetanide (BUMEX) 2 MG tablet Take 1 tablet (2 mg total) by mouth daily. 30 tablet 2 03/23/23 at 0819   diltiazem (CARDIZEM CD) 120 MG 24 hr capsule Take 120 mg by mouth daily.   03-23-23 at 0525   divalproex (DEPAKOTE) 500 MG DR tablet Take 500 mg by mouth daily.    03/23/2023 at 0819   ferrous sulfate 325 (65 FE) MG tablet Take 325 mg by mouth daily with breakfast.   March 23, 2023 at 0819   FLUoxetine (PROZAC) 20 MG capsule Take 60 mg by mouth daily.   03/23/23 at 0819   isosorbide mononitrate (IMDUR) 60 MG 24 hr tablet Take 60  mg by mouth daily.   03-23-23 at 0819   LANTUS 100 UNIT/ML injection Inject 0.1 mLs (10 Units total) into the skin at bedtime. (Patient taking differently: Inject 45 Units into the skin at bedtime.)   03/19/2023 at 1827   melatonin 5 MG TABS Take 1 tablet (5 mg total) by mouth at bedtime as needed. 30 tablet 0 prn at unknown   memantine (NAMENDA) 10 MG tablet Take 10 mg by mouth 2 (two) times daily.   Mar 23, 2023 at 0819   metoprolol succinate (TOPROL-XL) 25 MG 24 hr tablet Take 3 tablets (75 mg total) by mouth 2 (two) times daily. 180 tablet 2 03/23/2023 at 0819   Multiple Vitamin (MULTIVITAMIN) tablet Take 1 tablet by mouth daily.   March 23, 2023 at 0525   nitroGLYCERIN (NITROSTAT) 0.4 MG SL tablet Place 1 tablet (0.4 mg total) under the tongue every 5 (five) minutes as needed for chest pain. 20 tablet 12 prn at unknown   NOVOLOG FLEXPEN 100 UNIT/ML FlexPen Inject 6 Units into the skin 3 (three) times daily with meals.   03/23/2023 at 0819   polycarbophil (FIBERCON) 625 MG tablet Take 625 mg by mouth daily.   23-Mar-2023 at 0819   Rivaroxaban (XARELTO) 15 MG TABS tablet Take 1 tablet (15 mg total) by mouth daily with supper. Hold Xarelto for 3 days.  Start on January 09, 2019. 30 tablet 1 03/19/2023 at 1828   trimethoprim (TRIMPEX) 100 MG tablet Take 100 mg by mouth daily.   03/23/2023 at 0819   Vibegron (GEMTESA) 75 MG TABS Take 1 tablet (75 mg total) by mouth daily. 30 tablet 11 Mar 23, 2023 at 0525   Ensure Max Protein (ENSURE MAX PROTEIN) LIQD Take 330 mLs (11 oz total) by mouth 2 (two) times daily. (Patient taking differently: Take 11 oz by mouth daily.)      FLUoxetine HCl 60 MG TABS Take 1 tablet by mouth daily. (Patient not taking: Reported on 03/23/23)   Not Taking   insulin aspart (NOVOLOG) 100 UNIT/ML injection Inject 0-9 Units into the skin 3 (three) times daily with meals. CBG 70 - 120: 0 units CBG 121 - 150: 1 unit CBG 151 - 200: 2 units CBG 201 - 250: 3 units CBG 251 - 300: 5 units CBG 301  - 350: 7 units CBG 351 - 400: 9 units CBG > 400: call MD and obtain STAT lab verification 10 mL 11    Social History   Socioeconomic History   Marital status: Married    Spouse name: Not on file   Number of children: Not on file  Years of education: Not on file   Highest education level: Not on file  Occupational History   Occupation: retired  Tobacco Use   Smoking status: Former    Current packs/day: 0.00    Types: Cigarettes    Quit date: 10/14/1994    Years since quitting: 28.4    Passive exposure: Past   Smokeless tobacco: Never  Vaping Use   Vaping status: Never Used  Substance and Sexual Activity   Alcohol use: No    Alcohol/week: 0.0 standard drinks of alcohol   Drug use: No   Sexual activity: Not on file  Other Topics Concern   Not on file  Social History Narrative   Lives at home with wife, has a wheelchair   Social Determinants of Health   Financial Resource Strain: Low Risk  (01/31/2018)   Received from [facility], [facility]   Overall Financial Resource Strain (CARDIA)    Difficulty of Paying Living Expenses: Not very hard  Food Insecurity: Patient Unable To Answer (02/28/2023)   Hunger Vital Sign    Worried About Running Out of Food in the Last Year: Patient unable to answer    Ran Out of Food in the Last Year: Patient unable to answer  Transportation Needs: Patient Unable To Answer (02/27/2023)   PRAPARE - Transportation    Lack of Transportation (Medical): Patient unable to answer    Lack of Transportation (Non-Medical): Patient unable to answer  Physical Activity: Not on file  Stress: Not on file  Social Connections: Not on file  Intimate Partner Violence: Patient Unable To Answer (03/16/2023)   Humiliation, Afraid, Rape, and Kick questionnaire    Fear of Current or Ex-Partner: Patient unable to answer    Emotionally Abused: Patient unable to answer    Physically Abused: Patient unable to answer    Sexually  Abused: Patient unable to answer    Family History  Problem Relation Age of Onset   Hypertension Mother    Diabetes Mother    Prostate cancer Neg Hx    Bladder Cancer Neg Hx    Kidney disease Neg Hx      Vitals:   03/21/23 0900 03/21/23 0946 03/21/23 1200 03/21/23 1224  BP: 96/64 111/70 137/84   Pulse: (!) 138 (!) 121 (!) 131 (!) 121  Resp: (!) 26 (!) 24 (!) 24 (!) 30  Temp:   97.7 F (36.5 C)   TempSrc:   Axillary   SpO2: 92%  94%   Weight:      Height:        PHYSICAL EXAM General: Chronically ill appearing, well nourished, in no acute distress. HEENT: Normocephalic and atraumatic. Neck: No JVD.  Lungs: Normal respiratory effort on 2L [place]. Clear bilaterally to auscultation. No wheezes, crackles, rhonchi.  Heart: Irregularly irregular, fast rate. Normal S1 and S2 without gallops or murmurs.  Abdomen: Non-distended appearing.  Msk: Normal strength and tone for age. Extremities: Warm and well perfused. No clubbing, cyanosis. S/p bilat BKA Neuro: Alert and oriented X 3. Psych: Answers questions appropriately.   Labs: Basic Metabolic Panel: Recent Labs    02/27/2023 1046 03/21/23 0413  NA 143 149*  K 3.9 3.6  CL 103 114*  CO2 23 22  GLUCOSE 322* 180*  BUN 105* 108*  CREATININE 4.86* 4.26*  CALCIUM 8.7* 7.8*   Liver Function Tests: Recent Labs    03/23/2023 1046 03/21/23 0413  AST 60* 47*  ALT 36 30  ALKPHOS 130* 110  BILITOT 0.8 0.8  PROT 7.7 6.2*  ALBUMIN 3.1* 2.5*   Recent Labs    03/12/2023 1046  LIPASE 41   CBC: Recent Labs    03/12/2023 1046 03/21/23 0413 03/21/23 0623  WBC 14.8* 19.3* 20.1*  NEUTROABS 12.3* 15.8*  --   HGB 15.3 14.1 13.3  HCT 45.3 43.4 42.0  MCV 87.1 91.0 91.7  PLT 316 217 220   Cardiac Enzymes: Recent Labs    03/03/2023 1234 03/21/23 0836 03/21/23 1047  TROPONINIHS 321* 226* 214*   BNP: No results for input(s): "BNP" in the last 72 hours. D-Dimer: No results for input(s): "DDIMER" in the last 72 hours. Hemoglobin  A1C: Recent Labs    03/21/23 0413  HGBA1C 8.1*   Fasting Lipid Panel: No results for input(s): "CHOL", "HDL", "LDLCALC", "TRIG", "CHOLHDL", "LDLDIRECT" in the last 72 hours. Thyroid Function Tests: No results for input(s): "TSH", "T4TOTAL", "T3FREE", "THYROIDAB" in the last 72 hours.  Invalid input(s): "FREET3" Anemia Panel: No results for input(s): "VITAMINB12", "FOLATE", "FERRITIN", "TIBC", "IRON", "RETICCTPCT" in the last 72 hours.   Radiology: CT ABDOMEN PELVIS WO CONTRAST  Result Date: 03/12/2023 CLINICAL DATA:  Sepsis.  Acute abdominal pain. EXAM: CT ABDOMEN AND PELVIS WITHOUT CONTRAST TECHNIQUE: Multidetector CT imaging of the abdomen and pelvis was performed following the standard protocol without IV contrast. RADIATION DOSE REDUCTION: This exam was performed according to the departmental dose-optimization program which includes automated exposure control, adjustment of the mA and/or kV according to patient size and/or use of iterative reconstruction technique. COMPARISON:  11/30/2019. FINDINGS: Lower chest: Airway thickening is present with airway plugging in both lower lobes. Mild cardiomegaly is present with aortic valve calcification. There is aortic, left anterior descending, right, and circumflex coronary atheromatous vascular disease. Hepatobiliary: Cholecystectomy.  Otherwise unremarkable. Pancreas: Unremarkable Spleen: Unremarkable Adrenals/Urinary Tract: Foley catheter in the urinary bladder with a small amount of gas in the urinary bladder. Vascular calcifications along the renal hila. Relative left renal atrophy. 1.2 cm lesion of the left kidney upper pole with internal density 40 Hounsfield units, previously 90 Hounsfield units on noncontrast CT, probably a hemorrhagic cyst, although enhancement characteristics are not interrogated hence this lesion is technically indeterminate. Suspect 8 mm in long axis right mid kidney nonobstructive renal calculus on image 38 series 2.  Stomach/Bowel: Small lipoma in the transverse duodenum. Transverse duodenal diverticulum. Chronic prominence of stool in the rectal vault. There are few scattered sigmoid colon diverticula. No dilated bowel. Vascular/Lymphatic: Atherosclerosis is present, including aortoiliac atherosclerotic disease. Extensive atheromatous calcification in the celiac track and superior mesenteric artery. Patency is not assessed on today's noncontrast examination. Reproductive: Prostatectomy. Other: No supplemental non-categorized findings. Musculoskeletal: Right hip hemiarthroplasty. Bony demineralization. Lumbar and lower thoracic spondylosis. Small Morgagni hernia containing adipose tissue. IMPRESSION: 1. Airway thickening with airway plugging in both lower lobes, compatible with bronchitis or reactive airways disease. 2. Mild cardiomegaly with aortic valve calcification. 3. Aortic, left anterior descending, right, and circumflex coronary atheromatous vascular disease. Extensive systemic atherosclerosis including involvement of the superior mesenteric artery and celiac trunk. Patency not assessed on today's noncontrast exam. 4. Relative left renal atrophy. 5. 1.2 cm lesion of the left kidney upper pole with internal density 40 Hounsfield units, previously 90 Hounsfield units on noncontrast CT, probably a hemorrhagic cyst, although enhancement characteristics are not interrogated hence this lesion is technically indeterminate. Consider renal protocol MRI with and without contrast for definitive characterization. 6. Suspect 8 mm in long axis right mid kidney nonobstructive renal calculus. 7. Chronic prominence  of stool in the rectal vault. 8. Small Morgagni hernia containing adipose tissue. 9. Bony demineralization. Lumbar and lower thoracic spondylosis. Aortic Atherosclerosis (ICD10-I70.0). Electronically Signed   By: Gaylyn Rong M.D.   On: Apr 10, 2023 13:08   CT Head Wo Contrast  Result Date: 04/10/23 CLINICAL DATA:   88 year old male altered mental status. EXAM: CT HEAD WITHOUT CONTRAST TECHNIQUE: Contiguous axial images were obtained from the base of the skull through the vertex without intravenous contrast. RADIATION DOSE REDUCTION: This exam was performed according to the departmental dose-optimization program which includes automated exposure control, adjustment of the mA and/or kV according to patient size and/or use of iterative reconstruction technique. COMPARISON:  Brain MRI 03/06/2021.  Head CT 04/17/2021. FINDINGS: Brain: Stable cerebral volume. No midline shift, mass effect, or evidence of intracranial mass lesion. Stable ex vacuo appearing ventricular enlargement. Chronic dural calcification. No acute intracranial hemorrhage identified. Chronic deep cerebellar nuclei dystrophic calcification. No cortically based acute infarct identified. Patchy chronic white matter hypodensity. Vascular: Extensive Calcified atherosclerosis at the skull base. No suspicious intracranial vascular hyperdensity. Skull: No acute osseous abnormality identified. Sinuses/Orbits: New bilateral paranasal sinus inflammation with mucosal thickening and bubbly opacity, most pronounced in the maxillary sinuses. Tympanic cavities and mastoids appear to remain clear. Other: Calcified scalp vessel atherosclerosis. No acute orbit or scalp soft tissue finding. IMPRESSION: 1. No acute intracranial abnormality. Stable non contrast CT appearance of cerebral volume loss and white matter disease. 2. Bilateral paranasal sinus inflammation, new since 2022. Consider acute sinusitis. Electronically Signed   By: Odessa Fleming M.D.   On: 10-Apr-2023 12:54   DG Chest Port 1 View  Result Date: April 10, 2023 CLINICAL DATA:  Sepsis, atrial fibrillation EXAM: PORTABLE CHEST - 1 VIEW COMPARISON:  02/17/2023 FINDINGS: Low lung volumes with bronchovascular crowding in the lung bases. Heart size and mediastinal contours are within normal limits. Aortic Atherosclerosis  (ICD10-170.0). No effusion. Visualized bones unremarkable. IMPRESSION: Low volumes. No acute findings. Electronically Signed   By: Corlis Leak M.D.   On: 2023-04-10 11:07    ECHO 07/2022: 1. Left ventricular ejection fraction, by estimation, is 35 to 40%. The left ventricle has moderately decreased function. The left ventricle demonstrates regional wall motion abnormalities (see scoring diagram/findings for description). Left ventricular  diastolic parameters were normal.   2. Right ventricular systolic function is normal. The right ventricular size is normal.   3. Left atrial size was mild to moderately dilated.   4. The mitral valve is normal in structure. Mild to moderate mitral valve  regurgitation. No evidence of mitral stenosis.   5. The aortic valve is normal in structure. Aortic valve regurgitation is  not visualized. No aortic stenosis is present.   6. The inferior vena cava is normal in size with greater than 50%  respiratory variability, suggesting right atrial pressure of 3 mmHg.   TELEMETRY reviewed by me 03/21/2023: atrial fibrillation rate 140s  EKG reviewed by me: AF RVR rate 144 bpm  Data reviewed by me 03/21/2023: last 24h vitals tele labs imaging I/O ED provider note, admission H&P  Principal Problem:   Sepsis (HCC) Active Problems:   Primary hypertension   PAD (peripheral artery disease) (HCC)   Hyperlipidemia   AKI (acute kidney injury) (HCC)   S/P bilateral BKA (below knee amputation) (HCC)   Atrial fibrillation with rapid ventricular response (HCC)   Seizure disorder (HCC)   Lactic acidosis   Elevated troponin   Acute encephalopathy   Chronic systolic CHF (congestive heart failure) (HCC)  Type II diabetes mellitus with renal manifestations (HCC)    ASSESSMENT AND PLAN:  Julie Hennion is a 88 y.o. male  with a past medical history of paroxysmal AF (Xarelto), chronic HFrEF (35-40%, rWMAs 07/2022), 1vCAD (CTO ostial LAD, 06/2019), CKD III, DM2, hx bilat  BKA who presented to the ED on 03/18/2023 for altered mental status. Found to be septic in the ED, noted atrial fibrillation RVR on EKG and telemetry.  Cardiology was consulted for further evaluation.   # Sepsis # Acute encephalopathy Patient febrile, tachycardic, and tachypneic upon presentation with WBC 14.8. Now with worsening leukocytosis, no source of infection identified at this time.  -Management per primary.   # Atrial fibrillation RVR Patient with known hx AF recently started on sotalol but this was discontinued due to QTc prolongation. No issues with rate control after discharge from hospital 10/3. Now with AF RVR rates in the 140s in the setting of sepsis.  -Start amiodarone bolus and infusion for rate/rhythm control.  -Continue heparin infusion for anticoagulation.  -Plan to restart home metoprolol once able to take po.  -Would avoid CCB given HFrEF.   # Chronic HFrEF (EF 35-40% 07/2022) # Hypertension Euvolemic on exam.  -Continue home metoprolol.  -Hold home diuretics given sepsis and AKI.  -Monitor volume status closely with IVF.   # NSTEMI, type II # Coronary artery disease  Patient with hx of CAD medically managed with troponin elevation 377 > 321 > 226 > 214 since admission in the setting of severe sepsis.  -Troponin elevation most consistent with demand/supply mismatch and not ACS in the setting of sepsis. -Continue home asa, atorvastatin.  -Continue imdur 30 mg daily.   # AKI on CKD IIIb Cr elevated on admission to 4.86 improved to 4.26 on AM labs today. Baseline ~1.5.  -Continue to monitor renal function closely.  -Gentle IV fluids, monitor fluid status closely given hx HFrEF. Home diuretics held.  This patient's plan of care was discussed and created with Dr. Corky Sing and he is in agreement.  Signed: Gale Journey, PA-C  03/21/2023, 1:47 PM [facility]

## 2023-03-21 NOTE — Progress Notes (Signed)
Patient anxious and agitated throughout the night. Unable to verbalize pan, however continues to yell out.. Patient remains NPO. Dr. Para March notified, no new orders.

## 2023-03-21 NOTE — Progress Notes (Signed)
Called to bedside by Rn for pt desats. Pt is on NRB with SPO2 of 78%, HR 140, RR 34. Pt responds to pain. Suggested contacting pt provider for further orders and setting up suction for oral suction with yankauer.

## 2023-03-21 NOTE — Assessment & Plan Note (Signed)
Patient met severe sepsis criteria with leukocytosis, tachypnea and tachycardia and fever, also had lactic acidosis and AKI as endorgan dysfunction. Imaging concerning for bronchitis.  Preliminary blood cultures negative. Currently no other obvious source of infection but due to worsening leukocytosis, antibiotics were broadened to cefepime and Flagyl. -Continue cefepime and Flagyl for now -Follow-up cultures -Check procalcitonin

## 2023-03-21 NOTE — Assessment & Plan Note (Addendum)
Patient was on 45 units of Lantus at bedtime with SSI at his facility. A1c of 8 -Semglee 25 units nightly -SSI

## 2023-03-21 NOTE — Assessment & Plan Note (Signed)
No recent seizure reported. -Continue home divalproex -Seizure precaution

## 2023-03-21 NOTE — Hospital Course (Addendum)
Taken from H&P.  [name] is a 88 y.o. male with medical history significant of atrial fibrillation (Xarelto), systolic heart failure, chronic kidney disease, hypertension, hyperlipidemia, insulin-dependent type 2 diabetes, dementia who presents for altered mental status.   Per report patient is being altered for the past week, though EMS reports that he was normal and night before.  He was having abdominal pain in the past couple of days and his sugars were elevated.  On presenting to ED patient was febrile at 100.3, tachycardic and tachypneic, saturating well on room air.  Labs pertinent for lactic acidosis at 4.5>2.7 with hydration, leukocytosis at 14.8 with left shift, blood glucose 322, significantly elevated serum creatinine at 4.86, elevated AST and ALT be.  Elevated anion gap at 17.  UA not concerning for UTI.  Initial troponin 377>>322-likely demand ischemia. COVID, influenza and RSV panel was negative.  Blood cultures were obtained.  Portable chest x-ray showed low lung volumes but no acute findings.  CT head showed likely sinusitis.  CT abdomen pelvis showed airway thickening and airway plugging in both lower lobes concerning for bronchitis.  Patient given ceftriaxone and Flagyl in the ED.  Patient also received IV fluid bolus followed by infusion.  10/28: A-fib with RVR, worsening of leukocytosis at 20.1, hyponatremia with sodium at 149, glucose 180, BUN 108, creatinine 4.26, AST 47.  Lactic acidosis resolved. Switched ceftriaxone with cefepime due to worsening leukocytosis. Cardiology was consulted for persistent A-fib with RVR. Patient was also started on amiodarone bolus followed by infusion due to poor response with Lopressor and Cardizem.  According to daughter patient is hallucinating and altered for about a week, was seen in ED on 20 October and had a recent admission in September at [facility].  He was hallucinating, not eating and drinking and seems like going down the  hill.  10/29: Patient remained in A-fib with RVR, not much responding, a large of secretions requiring frequent suctioning, repeat chest x-ray overnight with no acute abnormality.  Renal ultrasound with similar findings of a right hemorrhagic renal cyst requiring nonemergent MRI, and nonobstructing renal calculi and echogenic kidneys consistent with medical renal disease.  Creatinine with very small improvement to 4 this morning, hypernatremia at 148, BNP significantly elevated at 2700 and procalcitonin 0.40.  Patient is in multiorgan failure including heart and kidneys, overall very obtunded to try BiPAP.  Required nonrebreather overnight, he is DNR and DNI, again discussed with son on phone.  A lot of secretions which he is unable to maintain.  Patient has very poor prognosis at this time. Palliative care was also consulted and started discussing goals of care with family.  Had another lengthy discussion with multiple family members at bedside.  They need some time to decide but no recent change and patient seems like going down the hill.

## 2023-03-21 NOTE — Consult Note (Signed)
PHARMACY - ANTICOAGULATION CONSULT NOTE  Pharmacy Consult for Heparin Indication: atrial fibrillation  Patient Measurements: Height: 4\' 5"  (134.6 cm) Weight: 87 kg (191 lb 12.8 oz) IBW/kg (Calculated) : 33.9  Labs: Recent Labs    03/20/23 1046 03/20/23 1234 03/20/23 1622 03/21/23 0413 03/21/23 0623 03/21/23 0836 03/21/23 1047 03/21/23 1210 03/21/23 2221  HGB 15.3  --   --  14.1 13.3  --   --   --   --   HCT 45.3  --   --  43.4 42.0  --   --   --   --   PLT 316  --   --  217 220  --   --   --   --   APTT 25  --   --  94*  --   --   --  112* 99*  LABPROT 18.9*  --   --   --   --   --   --   --   --   INR 1.6*  --   --   --   --   --   --   --   --   HEPARINUNFRC  --   --  <0.10* 0.93* 0.81*  --   --   --   --   CREATININE 4.86*  --   --  4.26*  --   --   --   --   --   TROPONINIHS 377* 321*  --   --   --  226* 214*  --   --     Estimated Creatinine Clearance: 9.9 mL/min (A) (by C-G formula based on SCr of 4.26 mg/dL (H)).  Medical History: Past Medical History:  Diagnosis Date   Acute respiratory failure with hypoxia (HCC) 02/08/2019   Arthritis    Atrial fibrillation (HCC)    Carcinoma of prostate (HCC)    CHF (congestive heart failure) (HCC)    Chronic kidney disease    Diabetes mellitus without complication [facility])    ED (erectile dysfunction)    Frequent urination    Hyperlipidemia    Hypertension    Leukocytosis 09/06/2016   Moderate mitral insufficiency    Myocardial injury 08/14/2022   Peripheral vascular disease (HCC)    Pneumonia 04/2016   Prostate cancer (HCC)    Sleep apnea    OSA--USE C-PAP   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 Appears patient is actively filling Xarelto 15 mg tablets outpatient. Last dose was 10/26 at 1828  Assessment: Patient is an 88 y/o M with medical history as above and including Afib on Xarelto presenting to  the ED 10/27 with altered mental status and abdominal pain. Patient in Afib with RVR. Code sepsis activated. Pharmacy consulted to initiate and manage heparin infusion for Afib.  Baseline aPTT 25s, INR 1.6. Baseline heparin level is pending. Baseline H&H, platelets are within normal limits.  10/28 0413 aPTT 94/HL 0.93 1028 1210 aPTT 112 10/28 2221 aPTT 99, therapeutic x 1  Goal of Therapy:  Heparin level 0.3-0.7 units/ml aPTT 66 - 102 seconds Monitor platelets by anticoagulation protocol: Yes   Plan:  Continue heparin infusion to 1050 units/hr Recheck aPTT w/ AM labs to confirm.  Check heparin level and CBC with AM labs.  Switch to heparin level monitoring once aPTT and heparin level correlate.   Otelia Sergeant, PharmD,  MBA 03/21/2023 11:38 PM

## 2023-03-21 NOTE — Progress Notes (Addendum)
SLP Cancellation Note  Patient Details Name: [name] MRN: [id number] DOB: 06-04-1934   Cancelled treatment:       Reason Eval/Treat Not Completed: Medical issues which prohibited therapy;Patient's level of consciousness;Patient not medically ready (chart reviewed; consulted NSG)   Per chart and NSG, pt has been anxious and agitated throughout the night; unable to verbalize pain but continued to yell out w/ arms flailing. NSG gave Ativan to calm. Pt currently lethargic, resting and poorly responsive to verbal/tactile stim.   Son may have indicated changes in pt's presentation "over days/weeks"? at home. Unsure of pt's baseline Cognitive status. NSG addressing NR issues at this time. ST services will hold on BSE and Cognitive-communication evaluations at this time d/t HIGH risk for aspiration. Recommend frequent oral care for hygiene and stimulation of swallowing when able to. NSG agreed.    Jerilynn Som, MS, CCC-SLP Speech Language Pathologist Rehab Services; [facility] [phone] (ascom) Balthazar Dooly 03/21/2023, 12:10 PM

## 2023-03-21 NOTE — Consult Note (Signed)
PHARMACY - ANTICOAGULATION CONSULT NOTE  Pharmacy Consult for Heparin Indication: atrial fibrillation  Patient Measurements: Height: 4\' 5"  (134.6 cm) Weight: 87 kg (191 lb 12.8 oz) IBW/kg (Calculated) : 33.9  Labs: Recent Labs    03/20/23 1046 03/20/23 1234 03/20/23 1622 03/21/23 0413 03/21/23 0623 03/21/23 0836 03/21/23 1047 03/21/23 1210  HGB 15.3  --   --  14.1 13.3  --   --   --   HCT 45.3  --   --  43.4 42.0  --   --   --   PLT 316  --   --  217 220  --   --   --   APTT 25  --   --  94*  --   --   --  112*  LABPROT 18.9*  --   --   --   --   --   --   --   INR 1.6*  --   --   --   --   --   --   --   HEPARINUNFRC  --   --  <0.10* 0.93* 0.81*  --   --   --   CREATININE 4.86*  --   --  4.26*  --   --   --   --   TROPONINIHS 377* 321*  --   --   --  226* 214*  --     Estimated Creatinine Clearance: 9.9 mL/min (A) (by C-G formula based on SCr of 4.26 mg/dL (H)).  Medical History: Past Medical History:  Diagnosis Date   Acute respiratory failure with hypoxia (HCC) 02/08/2019   Arthritis    Atrial fibrillation (HCC)    Carcinoma of prostate (HCC)    CHF (congestive heart failure) (HCC)    Chronic kidney disease    Diabetes mellitus without complication [facility])    ED (erectile dysfunction)    Frequent urination    Hyperlipidemia    Hypertension    Leukocytosis 09/06/2016   Moderate mitral insufficiency    Myocardial injury 08/14/2022   Peripheral vascular disease (HCC)    Pneumonia 04/2016   Prostate cancer (HCC)    Sleep apnea    OSA--USE C-PAP   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 Appears patient is actively filling Xarelto 15 mg tablets outpatient. Last dose was 10/26 at 1828  Assessment: Patient is an 88 y/o M with medical history as above and including Afib on Xarelto presenting to the ED 10/27 with altered mental status and abdominal pain.  Patient in Afib with RVR. Code sepsis activated. Pharmacy consulted to initiate and manage heparin infusion for Afib.  Baseline aPTT 25s, INR 1.6. Baseline heparin level is pending. Baseline H&H, platelets are within normal limits.  10/28 0413 aPTT 94/HL 0.93 1028 1210 aPTT 112.   Goal of Therapy:  Heparin level 0.3-0.7 units/ml aPTT 66 - 102 seconds Monitor platelets by anticoagulation protocol: Yes   Plan:  aPTT is slightly supratherapeutic. Will decrease heparin infusion to 1050 units/hr. Recheck aPTT in 8 hours. Check heparin level and CBC with AM labs. Switch to heparin level monitoring once aPTT and heparin level correlate.   Paschal Dopp, PharmD, BCPS 03/21/2023 1:15 PM

## 2023-03-21 NOTE — Assessment & Plan Note (Signed)
EF of 35 to 40% with regional wall motion abnormalities and left atrial enlargement on echo done in March 2024.  Clinically appears euvolemic. Did received IV fluids due to AKI and sepsis. -Check BNP -Holding home diuretics due to concern of sepsis -Monitor volume status closely

## 2023-03-21 NOTE — Assessment & Plan Note (Signed)
-   Continue home statin °

## 2023-03-21 NOTE — Assessment & Plan Note (Signed)
S/p bilateral BKA -Continue home statin and aspirin

## 2023-03-21 NOTE — Assessment & Plan Note (Addendum)
With history of CKD stage IIIb.  Creatinine at 4.86 on admission, mild improvement to 4.26 today, baseline appears to be around 1.5-1.6. -Renal ultrasound -Foley catheter was placed on admission -Strict intake and output -Gentle IV fluid -Monitor renal function -Avoid nephrotoxins

## 2023-03-21 NOTE — Progress Notes (Addendum)
Patient remains NPO. All PO medications held. Ok per Dr. York Ram to hold insulin.

## 2023-03-21 NOTE — Assessment & Plan Note (Signed)
Blood pressure currently within goal.  On Bumex, Cardizem, and Imdur and metoprolol at home. -Continue home Imdur, Cardizem and metoprolol -Holding Bumex

## 2023-03-21 NOTE — Progress Notes (Signed)
PT Cancellation Note  Patient Details Name: [name] MRN: [id number] DOB: 11-Jun-1934   Cancelled Treatment:    Reason Eval/Treat Not Completed: Other (comment): Per chart review, pt received ativan this morning with RN recommending deferring therapy at least another day. Will hold today and follow acutely for appropriateness for PT evaluation    D. Scott Jarvis Knodel PT, DPT 03/21/23, 8:41 AM

## 2023-03-21 NOTE — Assessment & Plan Note (Signed)
Likely acute metabolic encephalopathy with concern of sepsis. Intermittent agitation and received Ativan.

## 2023-03-21 NOTE — Assessment & Plan Note (Signed)
Patient remained in A-fib with RVR, not responding well to Lopressor and Cardizem. Cardiology was consulted and started on amiodarone infusion. Xarelto has been converted to heparin infusion due to altered mental status and unable to take p.o. at this time. -Continue to monitor

## 2023-03-21 NOTE — TOC Initial Note (Signed)
Transition of Care [facility]) - Initial/Assessment Note    Patient Details  Name: [name] MRN: [id number] Date of Birth: May 11, 1935  Transition of Care [facility]) CM/SW Contact:    Darolyn Rua, LCSW Phone Number: 03/21/2023, 3:48 PM  Clinical Narrative:                  Patient from [facility], hx of going to [facility] last admission.   PT/OT orders are in, pending evals for recs.   Expected Discharge Plan:  (TBD) Barriers to Discharge: Continued Medical Work up   Patient Goals and CMS Choice Patient states their goals for this hospitalization and ongoing recovery are:: to go home CMS Medicare.gov Compare Post Acute Care list provided to:: Patient Choice offered to / list presented to : Patient      Expected Discharge Plan and Services       Living arrangements for the past 2 months: Assisted Living Facility [facility])                                      Prior Living Arrangements/Services Living arrangements for the past 2 months: Assisted Living Facility [employer]) Lives with:: Facility Resident                   Activities of Daily Living   ADL Screening (condition at time of admission) Independently performs ADLs?: No Does the patient have a NEW difficulty with bathing/dressing/toileting/self-feeding that is expected to last >3 days?: Yes (Initiates electronic notice to provider for possible OT consult) Does the patient have a NEW difficulty with getting in/out of bed, walking, or climbing stairs that is expected to last >3 days?: Yes (Initiates electronic notice to provider for possible PT consult) Does the patient have a NEW difficulty with communication that is expected to last >3 days?: Yes (Initiates electronic notice to provider for possible SLP consult) Is the patient deaf or have difficulty hearing?: Yes Does the patient have difficulty seeing, even when wearing glasses/contacts?: Yes Does the patient have  difficulty concentrating, remembering, or making decisions?: Yes  Permission Sought/Granted                  Emotional Assessment              Admission diagnosis:  Sepsis (HCC) [A41.9] Patient Active Problem List   Diagnosis Date Noted   Sepsis (HCC) 03/02/2023   CHF exacerbation (HCC) 02/17/2023   Acute on chronic systolic CHF (congestive heart failure) (HCC) 08/14/2022   HTN (hypertension) 08/14/2022   Depression 08/14/2022   Type II diabetes mellitus with renal manifestations (HCC) 08/14/2022   Frequent UTI 04/21/2021   Mood disorder (HCC) 04/21/2021   Chronic systolic CHF (congestive heart failure) (HCC) 03/06/2021   Hyperglycemia due to type 2 diabetes mellitus (HCC) 11/10/2020   GERD (gastroesophageal reflux disease) 11/10/2020   Chest pain 11/09/2020   Elevated troponin I level 09/19/2020   Acute encephalopathy 07/29/2020   Lactic acidosis 07/28/2020   Hypoxic 07/28/2020   Chronic prescription opiate use 07/28/2020   Elevated troponin 07/28/2020   Chronic anticoagulation 06/25/2020   Chronic kidney disease, stage 3a (HCC) 06/25/2020   AMS (altered mental status) 04/08/2020   CAD (coronary artery disease) 03/06/2020   Personal history of COVID-19 03/06/2020   Acute toxic-metabolic encephalopathy 03/06/2020   UTI (urinary tract infection) 03/06/2020   (HFpEF) heart failure with preserved ejection  fraction (HCC) 03/06/2020   Goals of care, counseling/discussion    Palliative care by specialist    Seizure disorder (HCC) 11/30/2019   ACS (acute coronary syndrome) (HCC) 06/24/2019   Sinus bradycardia on ECG 05/16/2019   Bradycardia 05/16/2019   PAF (paroxysmal atrial fibrillation) (HCC) 03/27/2019   Hx of BKA, right (HCC) 03/02/2019   Severe protein-calorie malnutrition (HCC) 02/16/2019   Acute systolic CHF (congestive heart failure) (HCC) 02/16/2019   Acute respiratory failure with hypoxia (HCC) 02/08/2019   Pneumonia due to COVID-19 virus 02/08/2019    Below-knee amputation of right lower extremity (HCC) 01/15/2019   Polyp of descending colon    Iron deficiency anemia due to chronic blood loss    Sacral decubitus ulcer, stage II (HCC) 12/26/2018   Acute respiratory failure (HCC) 12/20/2018   Femur fracture (HCC) 10/13/2018   Nonspecific chest pain 06/03/2018   Chest pain, unspecified 06/03/2018   History of arterial ischemic stroke 03/21/2018   Hypothyroidism, acquired, autoimmune 02/21/2018   Hyperlipidemia associated with type 2 diabetes mellitus (HCC) 02/21/2018   NSTEMI (non-ST elevated myocardial infarction) (HCC) 01/16/2018   Acute CHF (congestive heart failure) (HCC) 11/23/2017   Atrial fibrillation with rapid ventricular response (HCC) 10/15/2017   BKA stump complication (HCC) 02/10/2017   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 (HCC) 02/10/2017   S/P bilateral BKA (below knee amputation) (HCC) 02/01/2017   Chronic diastolic heart failure (HCC) 01/21/2017   Pressure injury of skin 01/18/2017   Atherosclerosis of artery of extremity with gangrene (HCC) 01/05/2017   Diabetic foot ulcer (HCC) 12/29/2016   Ataxia 10/21/2016   Atherosclerosis of native arteries of the extremities with ulceration (HCC) 10/12/2016   History of femoral angiogram 09/06/2016   Left leg pain 09/06/2016   Generalized weakness 09/06/2016   AKI (acute kidney injury) (HCC) 09/06/2016   Atrial fibrillation, chronic (HCC) 08/30/2016   Pneumonia 05/19/2016   Hyperlipidemia 04/20/2016   Back pain 04/19/2016   DM (diabetes mellitus), type 2 with complications (HCC) 04/19/2016   Primary hypertension 04/19/2016   PAD (peripheral artery disease) (HCC) 04/19/2016   Erectile dysfunction following radical prostatectomy 06/25/2015   History of prostate cancer 12/23/2014   Incontinence 12/23/2014   PCP:  Luciana Axe, NP Pharmacy:   [facility] [phone] - [place], [place] - [address] [place] [address] Phone:  [phone] Fax: [phone]     Social Determinants of Health (SDOH) Social History: SDOH Screenings   Food Insecurity: Patient Unable To Answer (03/15/2023)  Housing: Patient Unable To Answer (02/27/2023)  Transportation Needs: Patient Unable To Answer (03/23/2023)  Utilities: Patient Unable To Answer (03/17/2023)  Financial Resource Strain: Low Risk  (01/31/2018)   Received from [facility], [facility]  Tobacco Use: Medium Risk (03/09/2023)   SDOH Interventions:     Readmission Risk Interventions    02/18/2023   10:00 AM  Readmission Risk Prevention Plan  Transportation Screening Complete  PCP or Specialist Appt within 3-5 Days Complete  HRI or Home Care Consult Complete  Social Work Consult for Recovery Care Planning/Counseling Complete  Palliative Care Screening Not Applicable  Medication Review Oceanographer) Complete

## 2023-03-21 NOTE — Consult Note (Signed)
Pharmacy Antibiotic Note  [name] is a 88 y.o. male admitted on 03/20/2023 with sepsis.  Pharmacy has been consulted for cefepime dosing. Tmax 100.3, Pt remains tachy, with an AKI, WBC trending up 14 > 20. No source of infection clearly identified. Per MD note, Chest x-ray showing evidence of bronchitis. Pt was on ceftriaxone, now escalating to cefepime.   Plan: Will start cefepime 1 g q24H due to CrCl.   Continue flagyl 500 mg IV BID.   Height: 4\' 5"  (134.6 cm) Weight: 87 kg (191 lb 12.8 oz) IBW/kg (Calculated) : 33.9  Temp (24hrs), Avg:99.9 F (37.7 C), Min:97.6 F (36.4 C), Max:100.3 F (37.9 C)  Recent Labs  Lab 03/20/23 1046 03/20/23 1234 03/20/23 1809 03/21/23 0413 03/21/23 0623  WBC 14.8*  --   --  19.3* 20.1*  CREATININE 4.86*  --   --  4.26*  --   LATICACIDVEN 4.5* 2.7* 1.6  --   --     Estimated Creatinine Clearance: 9.9 mL/min (A) (by C-G formula based on SCr of 4.26 mg/dL (H)).    Allergies  Allergen Reactions   Iodinated Contrast Media Shortness Of Breath and Anaphylaxis    SOB   Iohexol Shortness Of Breath     Desc: Respiratory Distress, Laryngedema, diaphoresis, Onset Date: [id number]    Metrizamide Shortness Of Breath    SOB SOB SOB    Latex Rash    Antimicrobials this admission: 10/27 ceftriaxone x 1 10/28 cefepime >>  10/27 flagyl >>   Dose adjustments this admission: None  Microbiology results: 10/27 BCx: pending   Thank you for allowing pharmacy to be a part of this patient's care.  Ronnald Ramp, PharmD, BCPS 03/21/2023 8:22 AM

## 2023-03-21 NOTE — Progress Notes (Signed)
Progress Note   Patient: [name] ZOX:[id number] DOB: 1935/01/14 DOA: 02/25/2023     1 DOS: the patient was seen and examined on 03/21/2023   Brief hospital course: Taken from H&P.  [name] is a 88 y.o. male with medical history significant of atrial fibrillation (Xarelto), systolic heart failure, chronic kidney disease, hypertension, hyperlipidemia, insulin-dependent type 2 diabetes, dementia who presents for altered mental status.   Per report patient is being altered for the past week, though EMS reports that he was normal and night before.  He was having abdominal pain in the past couple of days and his sugars were elevated.  On presenting to ED patient was febrile at 100.3, tachycardic and tachypneic, saturating well on room air.  Labs pertinent for lactic acidosis at 4.5>2.7 with hydration, leukocytosis at 14.8 with left shift, blood glucose 322, significantly elevated serum creatinine at 4.86, elevated AST and ALT be.  Elevated anion gap at 17.  UA not concerning for UTI.  Initial troponin 377>>322-likely demand ischemia. COVID, influenza and RSV panel was negative.  Blood cultures were obtained.  Portable chest x-ray showed low lung volumes but no acute findings.  CT head showed likely sinusitis.  CT abdomen pelvis showed airway thickening and airway plugging in both lower lobes concerning for bronchitis.  Patient given ceftriaxone and Flagyl in the ED.  Patient also received IV fluid bolus followed by infusion.  10/28: A-fib with RVR, worsening of leukocytosis at 20.1, hyponatremia with sodium at 149, glucose 180, BUN 108, creatinine 4.26, AST 47.  Lactic acidosis resolved. Switched ceftriaxone with cefepime due to worsening leukocytosis. Cardiology was consulted for persistent A-fib with RVR. Patient was also started on amiodarone bolus followed by infusion due to poor response with Lopressor and Cardizem.  According to daughter patient is hallucinating and  altered for about a week, was seen in ED on 20 October and had a recent admission in September at [facility].  He was hallucinating, not eating and drinking and seems like going down the hill.   Assessment and Plan: * Sepsis [facility]) Patient met severe sepsis criteria with leukocytosis, tachypnea and tachycardia and fever, also had lactic acidosis and AKI as endorgan dysfunction. Imaging concerning for bronchitis.  Preliminary blood cultures negative. Currently no other obvious source of infection but due to worsening leukocytosis, antibiotics were broadened to cefepime and Flagyl. -Continue cefepime and Flagyl for now -Follow-up cultures -Check procalcitonin  Atrial fibrillation with rapid ventricular response (HCC) Patient remained in A-fib with RVR, not responding well to Lopressor and Cardizem. Cardiology was consulted and started on amiodarone infusion. Xarelto has been converted to heparin infusion due to altered mental status and unable to take p.o. at this time. -Continue to monitor  Acute encephalopathy Likely acute metabolic encephalopathy with concern of sepsis. Intermittent agitation and received Ativan.  Elevated troponin Likely demand ischemia with persistent A-fib with RVR. -Continue to monitor  AKI (acute kidney injury) (HCC) With history of CKD stage IIIb.  Creatinine at 4.86 on admission, mild improvement to 4.26 today, baseline appears to be around 1.5-1.6. -Renal ultrasound -Foley catheter was placed on admission -Strict intake and output -Gentle IV fluid -Monitor renal function -Avoid nephrotoxins  Seizure disorder (HCC) No recent seizure reported. -Continue home divalproex -Seizure precaution  Chronic systolic CHF (congestive heart failure) (HCC) EF of 35 to 40% with regional wall motion abnormalities and left atrial enlargement on echo done in March 2024.  Clinically appears euvolemic. Did received IV fluids due to AKI and sepsis. -  Check BNP -Holding home  diuretics due to concern of sepsis -Monitor volume status closely  Primary hypertension Blood pressure currently within goal.  On Bumex, Cardizem, and Imdur and metoprolol at home. -Continue home Imdur, Cardizem and metoprolol -Holding Bumex  Type II diabetes mellitus with renal manifestations (HCC) Patient was on 45 units of Lantus at bedtime with SSI at his facility. A1c of 8 -Semglee 25 units nightly -SSI  Hyperlipidemia -Continue home statin  PAD (peripheral artery disease) (HCC) S/p bilateral BKA -Continue home statin and aspirin   Subjective: Patient was sedated when seen today.  He received Ativan little earlier due to excessive agitation.  Physical Exam: Vitals:   03/21/23 0900 03/21/23 0946 03/21/23 1200 03/21/23 1224  BP: 96/64 111/70 137/84   Pulse: (!) 138 (!) 121 (!) 131 (!) 121  Resp: (!) 26 (!) 24 (!) 24 (!) 30  Temp:   97.7 F (36.5 C)   TempSrc:   Axillary   SpO2: 92%  94%   Weight:      Height:       General.  Sedated patient, in no acute distress. Pulmonary.  Lungs clear bilaterally, normal respiratory effort. CV.  Irregularly irregular with tachycardia. Abdomen.  Soft, nontender, nondistended, BS positive. CNS.  Sedated, not following any commands Extremities.  Bilateral BKA  Data Reviewed: Prior data reviewed  Family Communication: Talked with daughter on phone.  Disposition: Status is: Inpatient Remains inpatient appropriate because: Severity of illness  Planned Discharge Destination: Skilled nursing facility  DVT prophylaxis: Heparin Time spent: 50 minutes  This record has been created using Conservation officer, historic buildings. Errors have been sought and corrected,but may not always be located. Such creation errors do not reflect on the standard of care.   Author: Arnetha Courser, MD 03/21/2023 1:39 PM  For on call review [email or web address].

## 2023-03-21 NOTE — Consult Note (Signed)
PHARMACY - ANTICOAGULATION CONSULT NOTE  Pharmacy Consult for IV Heparin Indication: atrial fibrillation  Patient Measurements: Height: 4\' 5"  (134.6 cm) Weight: 86 kg (189 lb 9.5 oz) IBW/kg (Calculated) : 33.9  Labs: Recent Labs    03/20/23 1046 03/20/23 1234 03/20/23 1622 03/21/23 0413  HGB 15.3  --   --  14.1  HCT 45.3  --   --  43.4  PLT 316  --   --  217  APTT 25  --   --  94*  LABPROT 18.9*  --   --   --   INR 1.6*  --   --   --   HEPARINUNFRC  --   --  <0.10* 0.93*  CREATININE 4.86*  --   --   --   TROPONINIHS 377* 321*  --   --     Estimated Creatinine Clearance: 8.6 mL/min (A) (by C-G formula based on SCr of 4.86 mg/dL (H)).  Medical History: Past Medical History:  Diagnosis Date   Acute respiratory failure with hypoxia (HCC) 02/08/2019   Arthritis    Atrial fibrillation (HCC)    Carcinoma of prostate (HCC)    CHF (congestive heart failure) (HCC)    Chronic kidney disease    Diabetes mellitus without complication [facility])    ED (erectile dysfunction)    Frequent urination    Hyperlipidemia    Hypertension    Leukocytosis 09/06/2016   Moderate mitral insufficiency    Myocardial injury 08/14/2022   Peripheral vascular disease (HCC)    Pneumonia 04/2016   Prostate cancer (HCC)    Sleep apnea    OSA--USE C-PAP   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 Appears patient is actively filling Xarelto 15 mg tablets outpatient. Last dose was 10/26 at 1828  Assessment: Patient is an 89 y/o M with medical history as above and including Afib on Xarelto presenting to the ED 10/27 with altered mental status and abdominal pain. Patient in Afib with RVR. Code sepsis activated. Pharmacy consulted to initiate and manage heparin infusion for Afib.  Baseline aPTT 25s, INR 1.6. Baseline heparin level is pending. Baseline H&H, platelets are within normal  limits.  10/28 0413 aPTT 94/HL 0.93  Goal of Therapy:  Heparin level 0.3-0.7 units/ml aPTT 66 - 102 seconds Monitor platelets by anticoagulation protocol: Yes   Plan:  --aPTT is therapeutic x 1, HL is still elevated due to Xarelto --Continue Heparin infusion at 1200 units/hr --aPTT in 8 hours for confirmation. Plan to follow aPTT for now given anticipated interference of Xarelto on anti-Xa level. Switch over to anti-Xa level monitoring only once correlation is established --Daily CBC per protocol while on IV heparin  Clovia Cuff, PharmD, BCPS 03/21/2023 4:50 AM

## 2023-03-22 DIAGNOSIS — Z7189 Other specified counseling: Secondary | ICD-10-CM | POA: Diagnosis not present

## 2023-03-22 LAB — RENAL FUNCTION PANEL
Albumin: 2.3 g/dL — ABNORMAL LOW (ref 3.5–5.0)
Anion gap: 13 (ref 5–15)
BUN: 96 mg/dL — ABNORMAL HIGH (ref 8–23)
CO2: 20 mmol/L — ABNORMAL LOW (ref 22–32)
Calcium: 7.7 mg/dL — ABNORMAL LOW (ref 8.9–10.3)
Chloride: 115 mmol/L — ABNORMAL HIGH (ref 98–111)
Creatinine, Ser: 4 mg/dL — ABNORMAL HIGH (ref 0.61–1.24)
GFR, Estimated: 14 mL/min — ABNORMAL LOW (ref >60)
Glucose, Bld: 206 mg/dL — ABNORMAL HIGH (ref 70–99)
Phosphorus: 4.7 mg/dL — ABNORMAL HIGH (ref 2.5–4.6)
Potassium: 3.7 mmol/L (ref 3.5–5.1)
Sodium: 148 mmol/L — ABNORMAL HIGH (ref 135–145)

## 2023-03-22 LAB — GLUCOSE, CAPILLARY
Glucose-Capillary: 195 mg/dL — ABNORMAL HIGH (ref 70–99)
Glucose-Capillary: 212 mg/dL — ABNORMAL HIGH (ref 70–99)

## 2023-03-22 LAB — CBC
HCT: 39.8 % (ref 39.0–52.0)
Hemoglobin: 13 g/dL (ref 13.0–17.0)
MCH: 29.5 pg (ref 26.0–34.0)
MCHC: 32.7 g/dL (ref 30.0–36.0)
MCV: 90.5 fL (ref 80.0–100.0)
Platelets: 193 10*3/uL (ref 150–400)
RBC: 4.4 MIL/uL (ref 4.22–5.81)
RDW: 15.1 % (ref 11.5–15.5)
WBC: 16.3 10*3/uL — ABNORMAL HIGH (ref 4.0–10.5)
nRBC: 0.1 % (ref 0.0–0.2)

## 2023-03-22 LAB — BLOOD GAS, VENOUS
Acid-base deficit: 0.7 mmol/L (ref 0.0–2.0)
Bicarbonate: 23.4 mmol/L (ref 20.0–28.0)
O2 Saturation: 33 %
Patient temperature: 37
Patient temperature: 37
pCO2, Ven: 36 mmHg — ABNORMAL LOW (ref 44–60)
pH, Ven: 7.42 (ref 7.25–7.43)

## 2023-03-22 LAB — APTT: aPTT: 82 s — ABNORMAL HIGH (ref 24–36)

## 2023-03-22 LAB — HEPARIN LEVEL (UNFRACTIONATED): Heparin Unfractionated: 0.54 [IU]/mL (ref 0.30–0.70)

## 2023-03-22 MED ORDER — ACETAMINOPHEN 325 MG PO TABS: 650.0000 mg | ORAL_TABLET | Freq: Four times a day (QID) | ORAL | Status: DC | PRN

## 2023-03-22 MED ORDER — ACETAMINOPHEN 650 MG RE SUPP: 650.0000 mg | Freq: Four times a day (QID) | RECTAL | Status: DC | PRN

## 2023-03-22 MED ORDER — IPRATROPIUM BROMIDE 0.02 % IN SOLN
0.5000 mg | Freq: Four times a day (QID) | RESPIRATORY_TRACT | Status: DC
  Administered 2023-03-22: 0.5 mg via RESPIRATORY_TRACT
  Filled 2023-03-22 (×2): qty 2.5

## 2023-03-22 MED ORDER — METOPROLOL TARTRATE 5 MG/5ML IV SOLN
5.0000 mg | Freq: Four times a day (QID) | INTRAVENOUS | Status: DC
  Administered 2023-03-22: 5 mg via INTRAVENOUS
  Filled 2023-03-22: qty 5

## 2023-03-22 MED ORDER — LEVALBUTEROL HCL 1.25 MG/0.5ML IN NEBU
1.2500 mg | INHALATION_SOLUTION | Freq: Four times a day (QID) | RESPIRATORY_TRACT | Status: DC
  Administered 2023-03-22: 1.25 mg via RESPIRATORY_TRACT
  Filled 2023-03-22 (×2): qty 0.5

## 2023-03-22 MED ORDER — INSULIN ASPART 100 UNIT/ML IJ SOLN
0.0000 [IU] | INTRAMUSCULAR | Status: DC
  Administered 2023-03-22: 3 [IU] via SUBCUTANEOUS
  Filled 2023-03-22: qty 1

## 2023-03-22 MED ORDER — LORAZEPAM 1 MG PO TABS
1.0000 mg | ORAL_TABLET | ORAL | Status: DC | PRN
Start: 2023-03-22 — End: 2023-03-23

## 2023-03-22 MED ORDER — GLYCOPYRROLATE 0.2 MG/ML IJ SOLN
0.1000 mg | Freq: Once | INTRAMUSCULAR | Status: AC
  Administered 2023-03-22: 0.1 mg via INTRAVENOUS
  Filled 2023-03-22: qty 0.5

## 2023-03-22 MED ORDER — POLYVINYL ALCOHOL 1.4 % OP SOLN: 1.0000 [drp] | Freq: Four times a day (QID) | OPHTHALMIC | Status: DC | PRN

## 2023-03-22 MED ORDER — ONDANSETRON 4 MG PO TBDP: 4.0000 mg | ORAL_TABLET | Freq: Four times a day (QID) | ORAL | Status: DC | PRN

## 2023-03-22 MED ORDER — BIOTENE DRY MOUTH MT LIQD: 15.0000 mL | OROMUCOSAL | Status: DC | PRN

## 2023-03-22 MED ORDER — HALOPERIDOL 0.5 MG PO TABS: 0.5000 mg | ORAL_TABLET | ORAL | Status: DC | PRN

## 2023-03-22 MED ORDER — LORAZEPAM 2 MG/ML IJ SOLN: 1.0000 mg | INTRAMUSCULAR | Status: DC | PRN

## 2023-03-22 MED ORDER — HALOPERIDOL LACTATE 5 MG/ML IJ SOLN
0.5000 mg | INTRAMUSCULAR | Status: DC | PRN
Start: 2023-03-22 — End: 2023-03-23

## 2023-03-22 MED ORDER — HYDROMORPHONE HCL-NACL 50-0.9 MG/50ML-% IV SOLN
0.5000 mg/h | INTRAVENOUS | Status: DC
  Administered 2023-03-22: 0.5 mg/h via INTRAVENOUS
  Filled 2023-03-22: qty 50

## 2023-03-22 MED ORDER — HYDROMORPHONE HCL 1 MG/ML IJ SOLN
0.5000 mg | INTRAMUSCULAR | Status: DC | PRN
  Administered 2023-03-22: 0.5 mg via INTRAVENOUS
  Filled 2023-03-22: qty 0.5

## 2023-03-22 MED ORDER — ONDANSETRON HCL 4 MG/2ML IJ SOLN: 4.0000 mg | Freq: Four times a day (QID) | INTRAMUSCULAR | Status: DC | PRN

## 2023-03-22 MED ORDER — FUROSEMIDE 10 MG/ML IJ SOLN
40.0000 mg | Freq: Once | INTRAMUSCULAR | Status: AC
  Administered 2023-03-22: 40 mg via INTRAVENOUS
  Filled 2023-03-22: qty 4

## 2023-03-22 MED ORDER — GLYCOPYRROLATE 0.2 MG/ML IJ SOLN: 0.2000 mg | INTRAMUSCULAR | Status: DC | PRN

## 2023-03-22 MED ORDER — GLYCOPYRROLATE 0.2 MG/ML IJ SOLN
0.2000 mg | INTRAMUSCULAR | Status: DC | PRN
  Filled 2023-03-22: qty 1

## 2023-03-22 MED ORDER — GLYCOPYRROLATE 1 MG PO TABS: 1.0000 mg | ORAL_TABLET | ORAL | Status: DC | PRN

## 2023-03-22 MED ORDER — MORPHINE SULFATE (PF) 2 MG/ML IV SOLN
1.0000 mg | INTRAVENOUS | Status: DC | PRN
Start: 2023-03-22 — End: 2023-03-22

## 2023-03-22 MED ORDER — HALOPERIDOL LACTATE 2 MG/ML PO CONC
0.5000 mg | ORAL | Status: DC | PRN
Start: 2023-03-22 — End: 2023-03-23

## 2023-03-22 MED ORDER — HYDROMORPHONE BOLUS VIA INFUSION: 0.2500 mg | INTRAVENOUS | Status: DC | PRN

## 2023-03-22 MED ORDER — LORAZEPAM 2 MG/ML PO CONC: 1.0000 mg | ORAL | Status: DC | PRN

## 2023-03-25 LAB — CULTURE, BLOOD (ROUTINE X 2)
Culture: NO GROWTH
Culture: NO GROWTH
Special Requests: ADEQUATE
Special Requests: ADEQUATE

## 2023-03-25 NOTE — Death Summary Note (Signed)
DEATH SUMMARY   Patient Details  Name: [name] MRN: [id number] DOB: Oct 01, 1934 WUJ:[id number], [name], NP Admission/Discharge Information   Admit Date:  April 06, 2023  Date of Death: Date of Death: 04/08/2023  Time of Death: Time of Death: 04/29/29  Length of Stay: 2   Principle Cause of death: Acute heart failure, persistent atrial fibrillation with RVR not responding to any medical treatment.  Hospital Diagnoses: Principal Problem:   Sepsis (HCC) Active Problems:   Atrial fibrillation with rapid ventricular response (HCC)   Elevated troponin   Acute encephalopathy   AKI (acute kidney injury) (HCC)   Seizure disorder (HCC)   Chronic systolic CHF (congestive heart failure) (HCC)   Primary hypertension   Type II diabetes mellitus with renal manifestations (HCC)   Hyperlipidemia   PAD (peripheral artery disease) (HCC)   S/P bilateral BKA (below knee amputation) (HCC)   Lactic acidosis   Hospital Course: Taken from H&P.  Edher [name] is a 88 y.o. male with medical history significant of atrial fibrillation (Xarelto), systolic heart failure, chronic kidney disease, hypertension, hyperlipidemia, insulin-dependent type 2 diabetes, dementia who presents for altered mental status.   Per report patient is being altered for the past week, though EMS reports that he was normal and night before.  He was having abdominal pain in the past couple of days and his sugars were elevated.  On presenting to ED patient was febrile at 100.3, tachycardic and tachypneic, saturating well on room air.  Labs pertinent for lactic acidosis at 4.5>2.7 with hydration, leukocytosis at 14.8 with left shift, blood glucose 322, significantly elevated serum creatinine at 4.86, elevated AST and ALT be.  Elevated anion gap at 17.  UA not concerning for UTI.  Initial troponin 377>>322-likely demand ischemia. COVID, influenza and RSV panel was negative.  Blood cultures were obtained.  Portable chest  x-ray showed low lung volumes but no acute findings.  CT head showed likely sinusitis.  CT abdomen pelvis showed airway thickening and airway plugging in both lower lobes concerning for bronchitis.  Patient given ceftriaxone and Flagyl in the ED.  Patient also received IV fluid bolus followed by infusion.  10/28: A-fib with RVR, worsening of leukocytosis at 20.1, hyponatremia with sodium at 149, glucose 180, BUN 108, creatinine 4.26, AST 47.  Lactic acidosis resolved. Switched ceftriaxone with cefepime due to worsening leukocytosis. Cardiology was consulted for persistent A-fib with RVR. Patient was also started on amiodarone bolus followed by infusion due to poor response with Lopressor and Cardizem.  According to daughter patient is hallucinating and altered for about a week, was seen in ED on 20 October and had a recent admission in September at [facility].  He was hallucinating, not eating and drinking and seems like going down the hill.  April 08, 2023: Patient remained in A-fib with RVR, not much responding, a large of secretions requiring frequent suctioning, repeat chest x-ray overnight with no acute abnormality.  Renal ultrasound with similar findings of a right hemorrhagic renal cyst requiring nonemergent MRI, and nonobstructing renal calculi and echogenic kidneys consistent with medical renal disease.  Creatinine with very small improvement to 4 this morning, hypernatremia at 148, BNP significantly elevated at 2700 and procalcitonin 0.40.  Patient is in multiorgan failure including heart and kidneys, overall very obtunded to try BiPAP.  Required nonrebreather overnight, he is DNR and DNI, again discussed with son on phone.  A lot of secretions which he is unable to maintain.  Patient has very poor prognosis at this time.  Palliative care was also consulted and started discussing goals of care with family.  Had another lengthy discussion with multiple family members at bedside.  They need some time to  decide but no recent change and patient seems like going down the hill.  Patient was later transitioned to full comfort care by family.  Started on Dilaudid infusion and plan was to continue amiodarone due to persistently elevated heart rate with atrial fibrillation.  Patient remained unresponsive and passed away at 8:30 PM.  Night on-call provider and nursing staff pronounced.  Family was notified.   Assessment and Plan: * Sepsis [facility]) Patient met severe sepsis criteria with leukocytosis, tachypnea and tachycardia and fever, also had lactic acidosis and AKI as endorgan dysfunction. Imaging concerning for bronchitis.  Preliminary blood cultures negative. Currently no other obvious source of infection but due to worsening leukocytosis, antibiotics were broadened to cefepime and Flagyl.  Procalcitonin at 0.40 -Continue cefepime and Flagyl for now -Follow-up cultures  Atrial fibrillation with rapid ventricular response (HCC) Patient remained in A-fib with RVR, not responding well to Lopressor and Cardizem. Cardiology was consulted and started on amiodarone infusion, heart rate remained in 140s.  Intermittent Lopressor boluses with no response, should avoid Cardizem due to heart failure. Xarelto has been converted to heparin infusion due to altered mental status and unable to take p.o. at this time. -Continue to monitor  Acute encephalopathy Likely acute metabolic encephalopathy , multifactorial with multiorgan dysfunction, severe AKI, remained in A-fib with RVR.  Remained pretty obtunded and unable to take care of his own secretions. Intermittent agitation requiring Ativan and Dilaudid. Patient seems like going down the hill, discussed with cardiology and they were also suggesting palliative care involvement due to poor prognosis based on advanced age and underlying comorbidities and current poor response to conventional treatment. -Palliative care was also consulted -Family might consider  moving to comfort care  Elevated troponin Likely demand ischemia with persistent A-fib with RVR. -Continue to monitor  AKI (acute kidney injury) (HCC) With history of CKD stage IIIb.  Creatinine at 4.86 on admission, mild improvement to 4.00 today, baseline appears to be around 1.5-1.6. -Renal ultrasound-with a hemorrhagic cyst and nonobstructing renal stone, echogenicity consistent with medical disease, no obvious obstruction. -Foley catheter was placed on admission -Strict intake and output -Gentle IV fluid -Monitor renal function -Avoid nephrotoxins  Seizure disorder (HCC) No recent seizure reported. -Continue home divalproex -Seizure precaution  Chronic systolic CHF (congestive heart failure) (HCC) EF of 35 to 40% with regional wall motion abnormalities and left atrial enlargement on echo done in March 2024.  Clinically appears euvolemic. Did received IV fluids due to AKI and sepsis.  Significantly elevated BNP at 2700 -Gave 1 dose of IV Lasix due to worsening respiratory status -Holding home diuretics due to concern of sepsis -Monitor volume status closely  Primary hypertension Blood pressure currently within goal.  On Bumex, Cardizem, and Imdur and metoprolol at home. -Continue home Imdur, Cardizem and metoprolol -Holding Bumex  Type II diabetes mellitus with renal manifestations (HCC) Patient was on 45 units of Lantus at bedtime with SSI at his facility. A1c of 8 -Semglee 25 units nightly -SSI  Hyperlipidemia -Continue home statin  PAD (peripheral artery disease) (HCC) S/p bilateral BKA -Continue home statin and aspirin   Procedures: None  Consultations: Cardiology.  Palliative care  The results of significant diagnostics from this hospitalization (including imaging, microbiology, ancillary and laboratory) are listed below for reference.   Significant Diagnostic Studies: DG Chest Port 1  View  Result Date: 03/21/2023 CLINICAL DATA:  Shortness of breath  EXAM: PORTABLE CHEST 1 VIEW COMPARISON:  03/14/2023 FINDINGS: Cardiomegaly. No acute airspace disease or effusion. Aortic atherosclerosis. No pneumothorax IMPRESSION: No active disease. Cardiomegaly. Electronically Signed   By: Jasmine Pang M.D.   On: 03/21/2023 23:57   US RENAL  Result Date: 03/21/2023 CLINICAL DATA:  [id number]. Acute kidney injury. Additionally, CT without contrast yesterday showing an indeterminate cystic lesion on the left. EXAM: RENAL / URINARY TRACT ULTRASOUND COMPLETE COMPARISON:  CT without contrast yesterday, renal ultrasound 03/06/2020. FINDINGS: Right Kidney: Renal measurements: 11.1 x 5.2 x 6.0 cm = volume: 183 mL. There is mild increased cortical echogenicity in general. No mass or hydronephrosis visualized. There is a 9 mm nonobstructive caliceal stone in the mid to lower pole. Left Kidney: Renal measurements: 9.5 x 4.3 x 3.7 cm = volume: 79 mL. There is mild generalized increased cortical echogenicity with generalized cortical thinning similar to 2021. No hydronephrosis or caliceal stones are seen. There is a 1.3 cm avascular hypodense lesion with internal echoes in the upper to midpole, sonographically appears consistent with a complex cystic lesion, possibly a hemorrhagic or proteinaceous cyst but indeterminate by ultrasound. MRI recommended. No other focal abnormality. Bladder: Catheterized, contracted and not well seen. Other: None. IMPRESSION: 1. 1.3 cm complex cystic lesion in the upper to midpole of the left kidney with no appreciable color flow, possibly a hemorrhagic or proteinaceous cyst but indeterminate by ultrasound. MRI recommended. 2. 9 mm nonobstructive caliceal stone in the mid to lower pole of the right kidney. 3. Mild increased cortical echogenicity in both kidneys, consistent with medical renal disease, with asymmetric cortical thinning on the left. 4. Catheterized, with the contracted bladder not well seen. Electronically Signed   By: Almira Bar M.D.   On:  03/21/2023 21:05   CT ABDOMEN PELVIS WO CONTRAST  Result Date: 03/04/2023 CLINICAL DATA:  Sepsis.  Acute abdominal pain. EXAM: CT ABDOMEN AND PELVIS WITHOUT CONTRAST TECHNIQUE: Multidetector CT imaging of the abdomen and pelvis was performed following the standard protocol without IV contrast. RADIATION DOSE REDUCTION: This exam was performed according to the departmental dose-optimization program which includes automated exposure control, adjustment of the mA and/or kV according to patient size and/or use of iterative reconstruction technique. COMPARISON:  11/30/2019. FINDINGS: Lower chest: Airway thickening is present with airway plugging in both lower lobes. Mild cardiomegaly is present with aortic valve calcification. There is aortic, left anterior descending, right, and circumflex coronary atheromatous vascular disease. Hepatobiliary: Cholecystectomy.  Otherwise unremarkable. Pancreas: Unremarkable Spleen: Unremarkable Adrenals/Urinary Tract: Foley catheter in the urinary bladder with a small amount of gas in the urinary bladder. Vascular calcifications along the renal hila. Relative left renal atrophy. 1.2 cm lesion of the left kidney upper pole with internal density 40 Hounsfield units, previously 90 Hounsfield units on noncontrast CT, probably a hemorrhagic cyst, although enhancement characteristics are not interrogated hence this lesion is technically indeterminate. Suspect 8 mm in long axis right mid kidney nonobstructive renal calculus on image 38 series 2. Stomach/Bowel: Small lipoma in the transverse duodenum. Transverse duodenal diverticulum. Chronic prominence of stool in the rectal vault. There are few scattered sigmoid colon diverticula. No dilated bowel. Vascular/Lymphatic: Atherosclerosis is present, including aortoiliac atherosclerotic disease. Extensive atheromatous calcification in the celiac track and superior mesenteric artery. Patency is not assessed on today's noncontrast examination.  Reproductive: Prostatectomy. Other: No supplemental non-categorized findings. Musculoskeletal: Right hip hemiarthroplasty. Bony demineralization. Lumbar and lower thoracic spondylosis. Small Morgagni hernia containing  adipose tissue. IMPRESSION: 1. Airway thickening with airway plugging in both lower lobes, compatible with bronchitis or reactive airways disease. 2. Mild cardiomegaly with aortic valve calcification. 3. Aortic, left anterior descending, right, and circumflex coronary atheromatous vascular disease. Extensive systemic atherosclerosis including involvement of the superior mesenteric artery and celiac trunk. Patency not assessed on today's noncontrast exam. 4. Relative left renal atrophy. 5. 1.2 cm lesion of the left kidney upper pole with internal density 40 Hounsfield units, previously 90 Hounsfield units on noncontrast CT, probably a hemorrhagic cyst, although enhancement characteristics are not interrogated hence this lesion is technically indeterminate. Consider renal protocol MRI with and without contrast for definitive characterization. 6. Suspect 8 mm in long axis right mid kidney nonobstructive renal calculus. 7. Chronic prominence of stool in the rectal vault. 8. Small Morgagni hernia containing adipose tissue. 9. Bony demineralization. Lumbar and lower thoracic spondylosis. Aortic Atherosclerosis (ICD10-I70.0). Electronically Signed   By: Gaylyn Rong M.D.   On: 04/09/23 13:08   CT Head Wo Contrast  Result Date: Apr 09, 2023 CLINICAL DATA:  88 year old male altered mental status. EXAM: CT HEAD WITHOUT CONTRAST TECHNIQUE: Contiguous axial images were obtained from the base of the skull through the vertex without intravenous contrast. RADIATION DOSE REDUCTION: This exam was performed according to the departmental dose-optimization program which includes automated exposure control, adjustment of the mA and/or kV according to patient size and/or use of iterative reconstruction technique.  COMPARISON:  Brain MRI 03/06/2021.  Head CT 04/17/2021. FINDINGS: Brain: Stable cerebral volume. No midline shift, mass effect, or evidence of intracranial mass lesion. Stable ex vacuo appearing ventricular enlargement. Chronic dural calcification. No acute intracranial hemorrhage identified. Chronic deep cerebellar nuclei dystrophic calcification. No cortically based acute infarct identified. Patchy chronic white matter hypodensity. Vascular: Extensive Calcified atherosclerosis at the skull base. No suspicious intracranial vascular hyperdensity. Skull: No acute osseous abnormality identified. Sinuses/Orbits: New bilateral paranasal sinus inflammation with mucosal thickening and bubbly opacity, most pronounced in the maxillary sinuses. Tympanic cavities and mastoids appear to remain clear. Other: Calcified scalp vessel atherosclerosis. No acute orbit or scalp soft tissue finding. IMPRESSION: 1. No acute intracranial abnormality. Stable non contrast CT appearance of cerebral volume loss and white matter disease. 2. Bilateral paranasal sinus inflammation, new since 2022. Consider acute sinusitis. Electronically Signed   By: Odessa Fleming M.D.   On: 04/09/2023 12:54   DG Chest Port 1 View  Result Date: April 09, 2023 CLINICAL DATA:  Sepsis, atrial fibrillation EXAM: PORTABLE CHEST - 1 VIEW COMPARISON:  02/17/2023 FINDINGS: Low lung volumes with bronchovascular crowding in the lung bases. Heart size and mediastinal contours are within normal limits. Aortic Atherosclerosis (ICD10-170.0). No effusion. Visualized bones unremarkable. IMPRESSION: Low volumes. No acute findings. Electronically Signed   By: Corlis Leak M.D.   On: 04-09-23 11:07    Microbiology: Recent Results (from the past 240 hour(s))  Blood Culture (routine x 2)     Status: None (Preliminary result)   Collection Time: 04/09/2023 10:47 AM   Specimen: BLOOD  Result Value Ref Range Status   Specimen Description BLOOD LEFT ANTECUBITAL  Final   Special  Requests   Final    BOTTLES DRAWN AEROBIC AND ANAEROBIC Blood Culture adequate volume   Culture   Final    NO GROWTH 3 DAYS Performed at [facility], [address] [place], [place] [address]    Report Status PENDING  Incomplete  Resp panel by RT-PCR (RSV, Flu A&B, Covid) Anterior Nasal Swab     Status: None  Collection Time: 2023/04/11 10:48 AM   Specimen: Anterior Nasal Swab  Result Value Ref Range Status   SARS Coronavirus 2 by RT PCR NEGATIVE NEGATIVE Final    Comment: (NOTE) SARS-CoV-2 target nucleic acids are NOT DETECTED.  The SARS-CoV-2 RNA is generally detectable in upper respiratory specimens during the acute phase of infection. The lowest concentration of SARS-CoV-2 viral copies this assay can detect is 138 copies/mL. A negative result does not preclude SARS-Cov-2 infection and should not be used as the sole basis for treatment or other patient management decisions. A negative result may occur with  improper specimen collection/handling, submission of specimen other than nasopharyngeal swab, presence of viral mutation(s) within the areas targeted by this assay, and inadequate number of viral copies(<138 copies/mL). A negative result must be combined with clinical observations, patient history, and epidemiological information. The expected result is Negative.  Fact Sheet for Patients:  BloggerCourse.com  Fact Sheet for Healthcare Providers:  SeriousBroker.it  This test is no t yet approved or cleared by the [place] FDA and  has been authorized for detection and/or diagnosis of SARS-CoV-2 by FDA under an Emergency Use Authorization (EUA). This EUA will remain  in effect (meaning this test can be used) for the duration of the COVID-19 declaration under Section 564(b)(1) of the Act, 21 U.S.C.section 360bbb-3(b)(1), unless the authorization is terminated  or revoked sooner.       Influenza A by PCR  NEGATIVE NEGATIVE Final   Influenza B by PCR NEGATIVE NEGATIVE Final    Comment: (NOTE) The Xpert Xpress SARS-CoV-2/FLU/RSV plus assay is intended as an aid in the diagnosis of influenza from Nasopharyngeal swab specimens and should not be used as a sole basis for treatment. Nasal washings and aspirates are unacceptable for Xpert Xpress SARS-CoV-2/FLU/RSV testing.  Fact Sheet for Patients: BloggerCourse.com  Fact Sheet for Healthcare Providers: SeriousBroker.it  This test is not yet approved or cleared by the [place] FDA and has been authorized for detection and/or diagnosis of SARS-CoV-2 by FDA under an Emergency Use Authorization (EUA). This EUA will remain in effect (meaning this test can be used) for the duration of the COVID-19 declaration under Section 564(b)(1) of the Act, 21 U.S.C. section 360bbb-3(b)(1), unless the authorization is terminated or revoked.     Resp Syncytial Virus by PCR NEGATIVE NEGATIVE Final    Comment: (NOTE) Fact Sheet for Patients: BloggerCourse.com  Fact Sheet for Healthcare Providers: SeriousBroker.it  This test is not yet approved or cleared by the [place] FDA and has been authorized for detection and/or diagnosis of SARS-CoV-2 by FDA under an Emergency Use Authorization (EUA). This EUA will remain in effect (meaning this test can be used) for the duration of the COVID-19 declaration under Section 564(b)(1) of the Act, 21 U.S.C. section 360bbb-3(b)(1), unless the authorization is terminated or revoked.  Performed at [facility], [address] [place], [place] [address]   Blood Culture (routine x 2)     Status: None (Preliminary result)   Collection Time: 04/11/23  2:00 PM   Specimen: BLOOD RIGHT HAND  Result Value Ref Range Status   Specimen Description BLOOD RIGHT HAND  Final   Special Requests   Final    BOTTLES  DRAWN AEROBIC AND ANAEROBIC Blood Culture adequate volume   Culture   Final    NO GROWTH 3 DAYS Performed at [facility], [address], [place], [place] [address]    Report Status PENDING  Incomplete    Time spent: >30  minutes  Signed: Arnetha Courser, MD 03/06/2023

## 2023-03-25 NOTE — Progress Notes (Signed)
Patient continues to be anxious and throwing arms against assist bars. Assist bars remain padded. When patient asked if he was in pain, patient nodded yes.Morphine given as ordered, somewhat effective. Ativan given for anxiety, somewhat effective.  Breathing TX administered per order. Patient suctioned several times through the evening, thick tan secretions noted. Robinul given as ordered. Mouth care provided x 3. Safety maintained.

## 2023-03-25 NOTE — Progress Notes (Signed)
[facility] CARDIOLOGY PROGRESS NOTE       Patient ID: [name] MRN: [id number] DOB/AGE: February 08, 1935 88 y.o.  Admit date: 03/13/2023 Referring Physician Dr. Arnetha Courser Primary Physician Luciana Axe, NP  Primary Cardiologist Dr. Juliann Pares Reason for Consultation atrial fibrillation RVR  HPI: [name] is a 88 y.o. male  with a past medical history of paroxysmal AF (Xarelto), chronic HFrEF (35-40%, rWMAs 07/2022), 1vCAD (CTO ostial LAD, 06/2019), CKD III, DM2, hx bilat BKA who presented to the ED on 02/26/2023 for altered mental status.  Found to be septic in the ED, noted atrial fibrillation RVR on EKG and telemetry.  Cardiology was consulted for further evaluation.   Interval history: -Patient remains in atrial fibrillation with elevated rate.  Rates overnight 120-130s.  -Somnolent on exam.  Improvement in creatinine this a.m. to 4.0.  Feels significantly elevated above baseline. -Episode of desaturation to the 60s to 70s overnight, placed on NRB.  Review of systems complete and found to be negative unless listed above    Past Medical History:  Diagnosis Date   Acute respiratory failure with hypoxia (HCC) 02/08/2019   Arthritis    Atrial fibrillation (HCC)    Carcinoma of prostate (HCC)    CHF (congestive heart failure) (HCC)    Chronic kidney disease    Diabetes mellitus without complication (HCC)    ED (erectile dysfunction)    Frequent urination    Hyperlipidemia    Hypertension    Leukocytosis 09/06/2016   Moderate mitral insufficiency    Myocardial injury 08/14/2022   Peripheral vascular disease (HCC)    Pneumonia 04/2016   Prostate cancer (HCC)    Sleep apnea    OSA--USE C-PAP   Ulcer of left foot due to type 2 diabetes mellitus (HCC)    Urinary stress incontinence, male     Past Surgical History:  Procedure Laterality Date   AMPUTATION Left 01/12/2017   Procedure: AMPUTATION BELOW KNEE;  Surgeon: Annice Needy, MD;  Location:  [facility] ORS;  Service: General;  Laterality: Left;   AMPUTATION Right 12/27/2018   Procedure: AMPUTATION BELOW KNEE;  Surgeon: Annice Needy, MD;  Location: [facility] ORS;  Service: General;  Laterality: Right;   APLIGRAFT PLACEMENT Left 10/15/2016   Procedure: APLIGRAFT PLACEMENT;  Surgeon: Recardo Evangelist, DPM;  Location: [facility] ORS;  Service: Podiatry;  Laterality: Left;  necrotic ulcer   CHOLECYSTECTOMY     COLONOSCOPY WITH PROPOFOL N/A 01/02/2019   Procedure: COLONOSCOPY WITH PROPOFOL;  Surgeon: Midge Minium, MD;  Location: [facility] ENDOSCOPY;  Service: Endoscopy;  Laterality: N/A;   COLONOSCOPY WITH PROPOFOL N/A 01/05/2019   Procedure: COLONOSCOPY WITH PROPOFOL;  Surgeon: Midge Minium, MD;  Location: [facility] ENDOSCOPY;  Service: Endoscopy;  Laterality: N/A;   ESOPHAGOGASTRODUODENOSCOPY (EGD) WITH PROPOFOL N/A 01/02/2019   Procedure: ESOPHAGOGASTRODUODENOSCOPY (EGD) WITH PROPOFOL;  Surgeon: Midge Minium, MD;  Location: [facility] ENDOSCOPY;  Service: Endoscopy;  Laterality: N/A;   EYE SURGERY Bilateral    Cataract Extraction with IOL   HIP ARTHROPLASTY Right 10/13/2018   Procedure: ARTHROPLASTY BIPOLAR HIP (HEMIARTHROPLASTY);  Surgeon: Deeann Saint, MD;  Location: [facility] ORS;  Service: Orthopedics;  Laterality: Right;   IRRIGATION AND DEBRIDEMENT FOOT Left 10/15/2016   Procedure: IRRIGATION AND DEBRIDEMENT FOOT-EXCISIONAL DEBRIDEMENT OF SKIN 3RD, 4TH AND 5TH TOES WITH APPLICATION OF APLIGRAFT;  Surgeon: Recardo Evangelist, DPM;  Location: [facility] ORS;  Service: Podiatry;  Laterality: Left;  necrotic,gangrene   IRRIGATION AND DEBRIDEMENT FOOT Left 12/09/2016   Procedure: IRRIGATION AND DEBRIDEMENT FOOT-MUSCLE/FASCIA, RESECTION  OF FOURTH AND FIFTH METATARSAL NECROTIC BONE;  Surgeon: Recardo Evangelist, DPM;  Location: [facility] ORS;  Service: Podiatry;  Laterality: Left;   IRRIGATION AND DEBRIDEMENT FOOT Right 12/23/2018   Procedure: IRRIGATION AND DEBRIDEMENT FOOT and wound vac application;  Surgeon: Gwyneth Revels, DPM;  Location: [facility]  ORS;  Service: Podiatry;  Laterality: Right;   LEFT HEART CATH N/A 06/25/2019   Procedure: Left Heart Cath and Coronary Angiography;  Surgeon: Marcina Millard, MD;  Location: [facility] INVASIVE CV LAB;  Service: Cardiovascular;  Laterality: N/A;   LEFT HEART CATH AND CORONARY ANGIOGRAPHY N/A 06/05/2018   Procedure: LEFT HEART CATH AND CORONARY ANGIOGRAPHY;  Surgeon: Alwyn Pea, MD;  Location: [facility] INVASIVE CV LAB;  Service: Cardiovascular;  Laterality: N/A;   LOWER EXTREMITY ANGIOGRAPHY Left 08/16/2016   Procedure: Lower Extremity Angiography;  Surgeon: Annice Needy, MD;  Location: [facility] INV[facility]IVE CV LAB;  Service: Cardiovascular;  Laterality: Left;   LOWER EXTREMITY ANGIOGRAPHY Left 09/01/2016   Procedure: Lower Extremity Angiography;  Surgeon: Annice Needy, MD;  Location: [facility] INV[facility] CV LAB;  Service: Cardiovascular;  Laterality: Left;   LOWER EXTREMITY ANGIOGRAPHY Left 10/14/2016   Procedure: Lower Extremity Angiography;  Surgeon: Annice Needy, MD;  Location: [facility] INVASIVE CV LAB;  Service: Cardiovascular;  Laterality: Left;   LOWER EXTREMITY ANGIOGRAPHY Left 12/20/2016   Procedure: Lower Extremity Angiography;  Surgeon: Annice Needy, MD;  Location: [facility] INV[facility] CV LAB;  Service: Cardiovascular;  Laterality: Left;   LOWER EXTREMITY ANGIOGRAPHY Right 12/18/2018   Procedure: LOWER EXTREMITY ANGIOGRAPHY;  Surgeon: Annice Needy, MD;  Location: [facility] INV[facility] CV LAB;  Service: Cardiovascular;  Laterality: Right;   LOWER EXTREMITY INTERVENTION  09/01/2016   Procedure: Lower Extremity Intervention;  Surgeon: Annice Needy, MD;  Location: [facility] INVASIVE CV LAB;  Service: Cardiovascular;;   PROSTATE SURGERY     removal   TONSILLECTOMY     as a child    Medications Prior to Admission  Medication Sig Dispense Refill Last Dose   acetaminophen (TYLENOL) 500 MG tablet Take 500 mg by mouth every 8 (eight) hours as needed for mild pain (pain score 1-3).   03/05/2023 at 0525   aspirin 81 MG EC tablet Take 81 mg  by mouth daily.   02/24/2023 at 0525   atorvastatin (LIPITOR) 80 MG tablet Take 80 mg by mouth at bedtime.   03/19/2023 at 1827   bisacodyl (DULCOLAX) 5 MG EC tablet Take 5 mg by mouth daily as needed for moderate constipation.   prn at unknown   bumetanide (BUMEX) 2 MG tablet Take 1 tablet (2 mg total) by mouth daily. 30 tablet 2 03/16/2023 at 0819   diltiazem (CARDIZEM CD) 120 MG 24 hr capsule Take 120 mg by mouth daily.   02/28/2023 at 0525   divalproex (DEPAKOTE) 500 MG DR tablet Take 500 mg by mouth daily.    03/03/2023 at 0819   ferrous sulfate 325 (65 FE) MG tablet Take 325 mg by mouth daily with breakfast.   03/19/2023 at 0819   FLUoxetine (PROZAC) 20 MG capsule Take 60 mg by mouth daily.   03/19/2023 at 0819   isosorbide mononitrate (IMDUR) 60 MG 24 hr tablet Take 60 mg by mouth daily.   03/05/2023 at 0819   LANTUS 100 UNIT/ML injection Inject 0.1 mLs (10 Units total) into the skin at bedtime. (Patient taking differently: Inject 45 Units into the skin at bedtime.)   03/19/2023 at 1827   melatonin 5 MG TABS Take 1 tablet (  5 mg total) by mouth at bedtime as needed. 30 tablet 0 prn at unknown   memantine (NAMENDA) 10 MG tablet Take 10 mg by mouth 2 (two) times daily.   02/26/2023 at 0819   metoprolol succinate (TOPROL-XL) 25 MG 24 hr tablet Take 3 tablets (75 mg total) by mouth 2 (two) times daily. 180 tablet 2 03/07/2023 at 0819   Multiple Vitamin (MULTIVITAMIN) tablet Take 1 tablet by mouth daily.   03/10/2023 at 0525   nitroGLYCERIN (NITROSTAT) 0.4 MG SL tablet Place 1 tablet (0.4 mg total) under the tongue every 5 (five) minutes as needed for chest pain. 20 tablet 12 prn at unknown   NOVOLOG FLEXPEN 100 UNIT/ML FlexPen Inject 6 Units into the skin 3 (three) times daily with meals.   03/17/2023 at 0819   polycarbophil (FIBERCON) 625 MG tablet Take 625 mg by mouth daily.   03/11/2023 at 0819   Rivaroxaban (XARELTO) 15 MG TABS tablet Take 1 tablet (15 mg total) by mouth daily with supper.  Hold Xarelto for 3 days.  Start on January 09, 2019. 30 tablet 1 03/19/2023 at 1828   trimethoprim (TRIMPEX) 100 MG tablet Take 100 mg by mouth daily.   02/24/2023 at 0819   Vibegron (GEMTESA) 75 MG TABS Take 1 tablet (75 mg total) by mouth daily. 30 tablet 11 03/06/2023 at 0525   Ensure Max Protein (ENSURE MAX PROTEIN) LIQD Take 330 mLs (11 oz total) by mouth 2 (two) times daily. (Patient taking differently: Take 11 oz by mouth daily.)      FLUoxetine HCl 60 MG TABS Take 1 tablet by mouth daily. (Patient not taking: Reported on 02/24/2023)   Not Taking   insulin aspart (NOVOLOG) 100 UNIT/ML injection Inject 0-9 Units into the skin 3 (three) times daily with meals. CBG 70 - 120: 0 units CBG 121 - 150: 1 unit CBG 151 - 200: 2 units CBG 201 - 250: 3 units CBG 251 - 300: 5 units CBG 301 - 350: 7 units CBG 351 - 400: 9 units CBG > 400: call MD and obtain STAT lab verification 10 mL 11    Social History   Socioeconomic History   Marital status: Married    Spouse name: Not on file   Number of children: Not on file   Years of education: Not on file   Highest education level: Not on file  Occupational History   Occupation: retired  Tobacco Use   Smoking status: Former    Current packs/day: 0.00    Types: Cigarettes    Quit date: 10/14/1994    Years since quitting: 28.4    Passive exposure: Past   Smokeless tobacco: Never  Vaping Use   Vaping status: Never Used  Substance and Sexual Activity   Alcohol use: No    Alcohol/week: 0.0 standard drinks of alcohol   Drug use: No   Sexual activity: Not on file  Other Topics Concern   Not on file  Social History Narrative   Lives at home with wife, has a wheelchair   Social Determinants of Health   Financial Resource Strain: Low Risk  (01/31/2018)   Received from [facility], [facility]   Overall Financial Resource Strain (CARDIA)    Difficulty of Paying Living Expenses: Not very hard  Food Insecurity:  Patient Unable To Answer (03/19/2023)   Hunger Vital Sign    Worried About Running Out of Food in the Last Year: Patient unable to answer  Ran Out of Food in the Last Year: Patient unable to answer  Transportation Needs: Patient Unable To Answer (04-14-23)   PRAPARE - Transportation    Lack of Transportation (Medical): Patient unable to answer    Lack of Transportation (Non-Medical): Patient unable to answer  Physical Activity: Not on file  Stress: Not on file  Social Connections: Not on file  Intimate Partner Violence: Patient Unable To Answer (14-Apr-2023)   Humiliation, Afraid, Rape, and Kick questionnaire    Fear of Current or Ex-Partner: Patient unable to answer    Emotionally Abused: Patient unable to answer    Physically Abused: Patient unable to answer    Sexually Abused: Patient unable to answer    Family History  Problem Relation Age of Onset   Hypertension Mother    Diabetes Mother    Prostate cancer Neg Hx    Bladder Cancer Neg Hx    Kidney disease Neg Hx      Vitals:   03/05/2023 0507 03/09/2023 0600 03/13/2023 0700 03/24/2023 0800  BP:  (!) 110/96  (!) 142/91  Pulse:  (!) 124  (!) 130  Resp:  (!) 22  (!) 34  Temp:    99.4 F (37.4 C)  TempSrc:    Oral  SpO2: 95%  100% 100%  Weight:      Height:        PHYSICAL EXAM General: Chronically ill appearing, well nourished, in no acute distress. HEENT: Normocephalic and atraumatic. Neck: No JVD.  Lungs: Normal respiratory effort on 6L NRB.  Rhonchi bilaterally Heart: Irregularly irregular, fast rate. Normal S1 and S2 without gallops or murmurs.  Abdomen: Non-distended appearing.  Msk: Normal strength and tone for age. Extremities: Warm and well perfused. No clubbing, cyanosis. S/p bilat BKA Neuro: Alert and oriented X 3. Psych: Answers questions appropriately.   Labs: Basic Metabolic Panel: Recent Labs    03/21/23 0413 03/15/2023 0427  NA 149* 148*  K 3.6 3.7  CL 114* 115*  CO2 22 20*  GLUCOSE 180* 206*   BUN 108* 96*  CREATININE 4.26* 4.00*  CALCIUM 7.8* 7.7*  PHOS  --  4.7*   Liver Function Tests: Recent Labs    04/14/23 1046 03/21/23 0413 02/28/2023 0427  AST 60* 47*  --   ALT 36 30  --   ALKPHOS 130* 110  --   BILITOT 0.8 0.8  --   PROT 7.7 6.2*  --   ALBUMIN 3.1* 2.5* 2.3*   Recent Labs    04-14-2023 1046  LIPASE 41   CBC: Recent Labs    Apr 14, 2023 1046 03/21/23 0413 03/21/23 0623 03/24/2023 0427  WBC 14.8* 19.3* 20.1* 16.3*  NEUTROABS 12.3* 15.8*  --   --   HGB 15.3 14.1 13.3 13.0  HCT 45.3 43.4 42.0 39.8  MCV 87.1 91.0 91.7 90.5  PLT 316 217 220 193   Cardiac Enzymes: Recent Labs    Apr 14, 2023 1234 03/21/23 0836 03/21/23 1047  TROPONINIHS 321* 226* 214*   BNP: Recent Labs    03/21/23 0623  BNP 2,700.2*   D-Dimer: No results for input(s): "DDIMER" in the last 72 hours. Hemoglobin A1C: Recent Labs    03/21/23 0413  HGBA1C 8.1*   Fasting Lipid Panel: No results for input(s): "CHOL", "HDL", "LDLCALC", "TRIG", "CHOLHDL", "LDLDIRECT" in the last 72 hours. Thyroid Function Tests: No results for input(s): "TSH", "T4TOTAL", "T3FREE", "THYROIDAB" in the last 72 hours.  Invalid input(s): "FREET3" Anemia Panel: No results for input(s): "VITAMINB12", "FOLATE", "FERRITIN", "TIBC", "IRON", "  RETICCTPCT" in the last 72 hours.   Radiology: [facility] Chest Port 1 View  Result Date: 03/21/2023 CLINICAL DATA:  Shortness of breath EXAM: PORTABLE CHEST 1 VIEW COMPARISON:  03/07/2023 FINDINGS: Cardiomegaly. No acute airspace disease or effusion. Aortic atherosclerosis. No pneumothorax IMPRESSION: No active disease. Cardiomegaly. Electronically Signed   By: Jasmine Pang M.D.   On: 03/21/2023 23:57   US RENAL  Result Date: 03/21/2023 CLINICAL DATA:  [id number]. Acute kidney injury. Additionally, CT without contrast yesterday showing an indeterminate cystic lesion on the left. EXAM: RENAL / URINARY TRACT ULTRASOUND COMPLETE COMPARISON:  CT without contrast yesterday, renal  ultrasound 03/06/2020. FINDINGS: Right Kidney: Renal measurements: 11.1 x 5.2 x 6.0 cm = volume: 183 mL. There is mild increased cortical echogenicity in general. No mass or hydronephrosis visualized. There is a 9 mm nonobstructive caliceal stone in the mid to lower pole. Left Kidney: Renal measurements: 9.5 x 4.3 x 3.7 cm = volume: 79 mL. There is mild generalized increased cortical echogenicity with generalized cortical thinning similar to 2021. No hydronephrosis or caliceal stones are seen. There is a 1.3 cm avascular hypodense lesion with internal echoes in the upper to midpole, sonographically appears consistent with a complex cystic lesion, possibly a hemorrhagic or proteinaceous cyst but indeterminate by ultrasound. MRI recommended. No other focal abnormality. Bladder: Catheterized, contracted and not well seen. Other: None. IMPRESSION: 1. 1.3 cm complex cystic lesion in the upper to midpole of the left kidney with no appreciable color flow, possibly a hemorrhagic or proteinaceous cyst but indeterminate by ultrasound. MRI recommended. 2. 9 mm nonobstructive caliceal stone in the mid to lower pole of the right kidney. 3. Mild increased cortical echogenicity in both kidneys, consistent with medical renal disease, with asymmetric cortical thinning on the left. 4. Catheterized, with the contracted bladder not well seen. Electronically Signed   By: Almira Bar M.D.   On: 03/21/2023 21:05   CT ABDOMEN PELVIS WO CONTRAST  Result Date: 03/23/2023 CLINICAL DATA:  Sepsis.  Acute abdominal pain. EXAM: CT ABDOMEN AND PELVIS WITHOUT CONTRAST TECHNIQUE: Multidetector CT imaging of the abdomen and pelvis was performed following the standard protocol without IV contrast. RADIATION DOSE REDUCTION: This exam was performed according to the departmental dose-optimization program which includes automated exposure control, adjustment of the mA and/or kV according to patient size and/or use of iterative reconstruction  technique. COMPARISON:  11/30/2019. FINDINGS: Lower chest: Airway thickening is present with airway plugging in both lower lobes. Mild cardiomegaly is present with aortic valve calcification. There is aortic, left anterior descending, right, and circumflex coronary atheromatous vascular disease. Hepatobiliary: Cholecystectomy.  Otherwise unremarkable. Pancreas: Unremarkable Spleen: Unremarkable Adrenals/Urinary Tract: Foley catheter in the urinary bladder with a small amount of gas in the urinary bladder. Vascular calcifications along the renal hila. Relative left renal atrophy. 1.2 cm lesion of the left kidney upper pole with internal density 40 Hounsfield units, previously 90 Hounsfield units on noncontrast CT, probably a hemorrhagic cyst, although enhancement characteristics are not interrogated hence this lesion is technically indeterminate. Suspect 8 mm in long axis right mid kidney nonobstructive renal calculus on image 38 series 2. Stomach/Bowel: Small lipoma in the transverse duodenum. Transverse duodenal diverticulum. Chronic prominence of stool in the rectal vault. There are few scattered sigmoid colon diverticula. No dilated bowel. Vascular/Lymphatic: Atherosclerosis is present, including aortoiliac atherosclerotic disease. Extensive atheromatous calcification in the celiac track and superior mesenteric artery. Patency is not assessed on today's noncontrast examination. Reproductive: Prostatectomy. Other: No supplemental non-categorized findings. Musculoskeletal: Right hip  hemiarthroplasty. Bony demineralization. Lumbar and lower thoracic spondylosis. Small Morgagni hernia containing adipose tissue. IMPRESSION: 1. Airway thickening with airway plugging in both lower lobes, compatible with bronchitis or reactive airways disease. 2. Mild cardiomegaly with aortic valve calcification. 3. Aortic, left anterior descending, right, and circumflex coronary atheromatous vascular disease. Extensive systemic  atherosclerosis including involvement of the superior mesenteric artery and celiac trunk. Patency not assessed on today's noncontrast exam. 4. Relative left renal atrophy. 5. 1.2 cm lesion of the left kidney upper pole with internal density 40 Hounsfield units, previously 90 Hounsfield units on noncontrast CT, probably a hemorrhagic cyst, although enhancement characteristics are not interrogated hence this lesion is technically indeterminate. Consider renal protocol MRI with and without contrast for definitive characterization. 6. Suspect 8 mm in long axis right mid kidney nonobstructive renal calculus. 7. Chronic prominence of stool in the rectal vault. 8. Small Morgagni hernia containing adipose tissue. 9. Bony demineralization. Lumbar and lower thoracic spondylosis. Aortic Atherosclerosis (ICD10-I70.0). Electronically Signed   By: Gaylyn Rong M.D.   On: Mar 30, 2023 13:08   CT Head Wo Contrast  Result Date: 03-30-2023 CLINICAL DATA:  88 year old male altered mental status. EXAM: CT HEAD WITHOUT CONTRAST TECHNIQUE: Contiguous axial images were obtained from the base of the skull through the vertex without intravenous contrast. RADIATION DOSE REDUCTION: This exam was performed according to the departmental dose-optimization program which includes automated exposure control, adjustment of the mA and/or kV according to patient size and/or use of iterative reconstruction technique. COMPARISON:  Brain MRI 03/06/2021.  Head CT 04/17/2021. FINDINGS: Brain: Stable cerebral volume. No midline shift, mass effect, or evidence of intracranial mass lesion. Stable ex vacuo appearing ventricular enlargement. Chronic dural calcification. No acute intracranial hemorrhage identified. Chronic deep cerebellar nuclei dystrophic calcification. No cortically based acute infarct identified. Patchy chronic white matter hypodensity. Vascular: Extensive Calcified atherosclerosis at the skull base. No suspicious intracranial  vascular hyperdensity. Skull: No acute osseous abnormality identified. Sinuses/Orbits: New bilateral paranasal sinus inflammation with mucosal thickening and bubbly opacity, most pronounced in the maxillary sinuses. Tympanic cavities and mastoids appear to remain clear. Other: Calcified scalp vessel atherosclerosis. No acute orbit or scalp soft tissue finding. IMPRESSION: 1. No acute intracranial abnormality. Stable non contrast CT appearance of cerebral volume loss and white matter disease. 2. Bilateral paranasal sinus inflammation, new since 2022. Consider acute sinusitis. Electronically Signed   By: Odessa Fleming M.D.   On: 2023/03/30 12:54   DG Chest Port 1 View  Result Date: 03-30-2023 CLINICAL DATA:  Sepsis, atrial fibrillation EXAM: PORTABLE CHEST - 1 VIEW COMPARISON:  02/17/2023 FINDINGS: Low lung volumes with bronchovascular crowding in the lung bases. Heart size and mediastinal contours are within normal limits. Aortic Atherosclerosis (ICD10-170.0). No effusion. Visualized bones unremarkable. IMPRESSION: Low volumes. No acute findings. Electronically Signed   By: Corlis Leak M.D.   On: March 30, 2023 11:07    ECHO 07/2022: 1. Left ventricular ejection fraction, by estimation, is 35 to 40%. The left ventricle has moderately decreased function. The left ventricle demonstrates regional wall motion abnormalities (see scoring diagram/findings for description). Left ventricular  diastolic parameters were normal.   2. Right ventricular systolic function is normal. The right ventricular size is normal.   3. Left atrial size was mild to moderately dilated.   4. The mitral valve is normal in structure. Mild to moderate mitral valve  regurgitation. No evidence of mitral stenosis.   5. The aortic valve is normal in structure. Aortic valve regurgitation is  not visualized. No aortic stenosis  is present.   6. The inferior vena cava is normal in size with greater than 50%  respiratory variability, suggesting right  atrial pressure of 3 mmHg.   TELEMETRY reviewed by me Apr 16, 2023: Atrial fibrillation rate 120-130s  EKG reviewed by me: AF RVR rate 144 bpm  Data reviewed by me 04/16/2023: last 24h vitals tele labs imaging I/O hospitalist progress note  Principal Problem:   Sepsis (HCC) Active Problems:   Primary hypertension   PAD (peripheral artery disease) (HCC)   Hyperlipidemia   AKI (acute kidney injury) (HCC)   S/P bilateral BKA (below knee amputation) (HCC)   Atrial fibrillation with rapid ventricular response (HCC)   Seizure disorder (HCC)   Lactic acidosis   Elevated troponin   Acute encephalopathy   Chronic systolic CHF (congestive heart failure) (HCC)   Type II diabetes mellitus with renal manifestations (HCC)    ASSESSMENT AND PLAN:  Jossue Sumi is a 88 y.o. male  with a past medical history of paroxysmal AF (Xarelto), chronic HFrEF (35-40%, rWMAs 07/2022), 1vCAD (CTO ostial LAD, 06/2019), CKD III, DM2, hx bilat BKA who presented to the ED on 03/10/2023 for altered mental status. Found to be septic in the ED, noted atrial fibrillation RVR on EKG and telemetry.  Cardiology was consulted for further evaluation.   # Sepsis # Acute encephalopathy Patient febrile, tachycardic, and tachypneic upon presentation with WBC 14.8. Now with worsening leukocytosis, no source of infection identified at this time.  -Management per primary.   # Atrial fibrillation RVR Patient with known hx AF recently started on sotalol but this was discontinued due to QTc prolongation. No issues with rate control after discharge from hospital 10/3. Now with AF RVR rates in the 140s in the setting of sepsis.  -IV metoprolol 5 mg every 6 for rate control given unable to take p.o. -Continue amiodarone infusion for rate/rhythm control.  -Continue heparin infusion for anticoagulation.  -Would avoid CCB given HFrEF.   # Chronic HFrEF (EF 35-40% 07/2022) # Hypertension With respiratory decompensation  overnight, BNP checked and found to be elevated. -Will give 1 dose of IV Lasix today. -Monitor volume status closely with IVF.   # NSTEMI, type II # Coronary artery disease  Patient with hx of CAD medically managed with troponin elevation 377 > 321 > 226 > 214 since admission in the setting of severe sepsis.  -Troponin elevation most consistent with demand/supply mismatch and not ACS in the setting of sepsis. -Home meds held as patient unable to take p.o. medication  # AKI on CKD IIIb Cr elevated on admission to 4.86 improved to 4.00 on AM labs today. Baseline ~1.5.  -Continue to monitor renal function closely.  -Gentle IV fluids, monitor fluid status closely given hx HFrEF. Home diuretics held.  Of note, patient has an overall poor prognosis given his multiple comorbidities.  Per hospitalist, family is considering comfort care.  This patient's plan of care was discussed and created with Dr. Corky Sing and he is in agreement.  Signed: Gale Journey, PA-C  2023/04/16, 9:02 AM [facility]

## 2023-03-25 NOTE — Assessment & Plan Note (Signed)
With history of CKD stage IIIb.  Creatinine at 4.86 on admission, mild improvement to 4.00 today, baseline appears to be around 1.5-1.6. -Renal ultrasound-with a hemorrhagic cyst and nonobstructing renal stone, echogenicity consistent with medical disease, no obvious obstruction. -Foley catheter was placed on admission -Strict intake and output -Gentle IV fluid -Monitor renal function -Avoid nephrotoxins

## 2023-03-25 NOTE — Progress Notes (Signed)
OT Cancellation Note  Patient Details Name: [name] MRN: [id number] DOB: [birth year]   Cancelled Treatment:    Reason Eval/Treat Not Completed: Patient not medically ready. Consult received, chart reviewed. Spoke with RN. Pt currently unable to follow commands, pending palliative consult. Per chart review, HR not yet controlled and amiodarone initiated <24hrs. Inappropriate for OT evaluation today. Will acutely follow and re-attempt OT evaluation at later date/time as medically appropriate.    Arman Filter., MPH, MS, OTR/L ascom [phone] , 9:13 AM

## 2023-03-25 NOTE — Assessment & Plan Note (Signed)
Patient met severe sepsis criteria with leukocytosis, tachypnea and tachycardia and fever, also had lactic acidosis and AKI as endorgan dysfunction. Imaging concerning for bronchitis.  Preliminary blood cultures negative. Currently no other obvious source of infection but due to worsening leukocytosis, antibiotics were broadened to cefepime and Flagyl.  Procalcitonin at 0.40 -Continue cefepime and Flagyl for now -Follow-up cultures

## 2023-03-25 NOTE — Plan of Care (Signed)
Progressing

## 2023-03-25 NOTE — Assessment & Plan Note (Signed)
Likely demand ischemia with persistent A-fib with RVR. -Continue to monitor

## 2023-03-25 NOTE — Progress Notes (Signed)
PT Cancellation Note  Patient Details Name: Markeal Delconte MRN: [id number] DOB: [birth year]   Cancelled Treatment:    Reason Eval/Treat Not Completed: Other (comment): Nursing requested PT hold this date pending outcome of plan of care decisions.  Will attempt to see pt at a future date/time as medically appropriate.     Ovidio Hanger PT, DPT , 9:13 AM

## 2023-03-25 NOTE — Progress Notes (Signed)
CROSS COVER NOTE  NAME: [name] MRN: [id number] DOB : 1935-04-27    Concern as stated by nurse / staff   Called respiratory to the bedside earlier to evaluate the patient. Patient mouth breathing a desating. A nonrebreather was placed on the patient because the nasal cannula was not working. Oxgen in the 60's and 70's but with nonrebreather in the 90's. Patient suctioned as well.      Pertinent findings on chart review: Last progress note reviewed    03/06/2023    4:32 AM 03/12/2023    4:05 AM 03/17/2023    1:00 AM  Vitals with BMI  Weight 198 lbs 10 oz    BMI 49.73    Systolic  108 127  Diastolic  80 73  Pulse  97 137   Chest x-ray showed no active disease   Patient assessment Physical Exam Vitals and nursing note reviewed.  Constitutional:      Comments: Very frail elderly male, difficult to arouse, tachypneic, NRB  Cardiovascular:     Rate and Rhythm: Regular rhythm. Tachycardia present.     Heart sounds: Normal heart sounds.  Pulmonary:     Effort: Tachypnea present.     Breath sounds: Normal breath sounds.     Comments: Increased work of breathing, coarse breath sounds Abdominal:     Palpations: Abdomen is soft.     Tenderness: There is no abdominal tenderness.  Neurological:     Mental Status: He is lethargic.     Comments: Lethargic, somnolent, difficult to arouse      Assessment and  Interventions   Assessment:  Acute respiratory failure secondary to acute medical conditions probably nearing end-of-life  Plan: Palliative care consult for goals of care discussion, specifically comfort care Patient has been treated with DuoNebs, Robinul and suctioning Continue NRB.  Too obtunded to try BiPAP.       CRITICAL CARE Performed by: Andris Baumann   Total critical care time: 30 minutes  Critical care time was exclusive of separately billable procedures and treating other patients.  Critical care was necessary to treat or prevent  imminent or life-threatening deterioration.  Critical care was time spent personally by me on the following activities: development of treatment plan with patient and/or surrogate as well as nursing, discussions with consultants, evaluation of patient's response to treatment, examination of patient, obtaining history from patient or surrogate, ordering and performing treatments and interventions, ordering and review of laboratory studies, ordering and review of radiographic studies, pulse oximetry and re-evaluation of patient's condition.

## 2023-03-25 NOTE — Final Progress Note (Signed)
Patient expired on 22 Mar 2023 at 2030. RN and charge at bedside to declare time of death. Patient belongings are with patient in the morgue and consist of black shirt and shorts, 2 lower extremity prosthetics, and a wedding ring. Honor bridge notified at 2045. Patient family notified at 2100. Patient going to [facility] in [place].

## 2023-03-25 NOTE — Assessment & Plan Note (Signed)
Likely acute metabolic encephalopathy , multifactorial with multiorgan dysfunction, severe AKI, remained in A-fib with RVR.  Remained pretty obtunded and unable to take care of his own secretions. Intermittent agitation requiring Ativan and Dilaudid. Patient seems like going down the hill, discussed with cardiology and they were also suggesting palliative care involvement due to poor prognosis based on advanced age and underlying comorbidities and current poor response to conventional treatment. -Palliative care was also consulted -Family might consider moving to comfort care

## 2023-03-25 NOTE — Consult Note (Signed)
PHARMACY - ANTICOAGULATION CONSULT NOTE  Pharmacy Consult for Heparin Indication: atrial fibrillation  Patient Measurements: Height: 4\' 5"  (134.6 cm) Weight: 90.1 kg (198 lb 10.2 oz) IBW/kg (Calculated) : 33.9  Labs: Recent Labs    03/20/23 1046 03/20/23 1234 03/20/23 1622 03/21/23 0413 03/21/23 0623 03/21/23 0836 03/21/23 1047 03/21/23 1210 03/21/23 2221  0427  HGB 15.3  --   --  14.1 13.3  --   --   --   --  13.0  HCT 45.3  --   --  43.4 42.0  --   --   --   --  39.8  PLT 316  --   --  217 220  --   --   --   --  193  APTT 25  --   --  94*  --   --   --  112* 99* 82*  LABPROT 18.9*  --   --   --   --   --   --   --   --   --   INR 1.6*  --   --   --   --   --   --   --   --   --   HEPARINUNFRC  --   --    < > 0.93* 0.81*  --   --   --   --  0.54  CREATININE 4.86*  --   --  4.26*  --   --   --   --   --  4.00*  TROPONINIHS 377* 321*  --   --   --  226* 214*  --   --   --    < > = values in this interval not displayed.    Estimated Creatinine Clearance: 10.8 mL/min (A) (by C-G formula based on SCr of 4 mg/dL (H)).  Medical History: Past Medical History:  Diagnosis Date   Acute respiratory failure with hypoxia (HCC) 02/08/2019   Arthritis    Atrial fibrillation (HCC)    Carcinoma of prostate (HCC)    CHF (congestive heart failure) (HCC)    Chronic kidney disease    Diabetes mellitus without complication [facility])    ED (erectile dysfunction)    Frequent urination    Hyperlipidemia    Hypertension    Leukocytosis 09/06/2016   Moderate mitral insufficiency    Myocardial injury 08/14/2022   Peripheral vascular disease (HCC)    Pneumonia 04/2016   Prostate cancer (HCC)    Sleep apnea    OSA--USE C-PAP   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 Appears patient is actively filling Xarelto 15 mg tablets outpatient. Last dose was 10/26 at  1828  Assessment: Patient is an 88 y/o M with medical history as above and including Afib on Xarelto presenting to the ED 10/27 with altered mental status and abdominal pain. Patient in Afib with RVR. Code sepsis activated. Pharmacy consulted to initiate and manage heparin infusion for Afib.  Baseline aPTT 25s, INR 1.6. Baseline heparin level is pending. Baseline H&H, platelets are within normal limits.  10/28 0413 aPTT 94/HL 0.93 1028 1210 aPTT 112 10/28 2221 aPTT 99, therapeutic x 1 10/29 0427 aPTT 82, therapeutic x 1 / HL 0.54, correlating x 1   Goal of Therapy:  Heparin level 0.3-0.7 units/ml aPTT 66 - 102 seconds Monitor platelets by anticoagulation protocol:  Yes   Plan:  Continue heparin infusion to 1050 units/hr Recheck aPTT daily w/ AM labs while therapeuti, Check heparin level and CBC with AM labs.  Switch to heparin level monitoring once aPTT and heparin level correlation confirmed  Otelia Sergeant, PharmD, [facility]  5:39 AM

## 2023-03-25 NOTE — Progress Notes (Signed)
Patient family at bedside. Patient continues to yell and moan. When patient asked if he is in pain he nods his head yes. Patient remains mute. Dr. Nelson Chimes notified and new orders for Ativan for anxiety and Morphine for pain. Medications given and effective.    At 2200 Respiratory notified for decreased o2 and secretions. Respiratory stated that the nonrebreather  is good and to suctions him. Patient has been suctioned several times.   0115 Patient continues to remain be suctioned thick brown secretions noted.    0400 Continues to suction patient for increased secretions.

## 2023-03-25 NOTE — Assessment & Plan Note (Signed)
Patient remained in A-fib with RVR, not responding well to Lopressor and Cardizem. Cardiology was consulted and started on amiodarone infusion, heart rate remained in 140s.  Intermittent Lopressor boluses with no response, should avoid Cardizem due to heart failure. Xarelto has been converted to heparin infusion due to altered mental status and unable to take p.o. at this time. -Continue to monitor

## 2023-03-25 NOTE — Progress Notes (Addendum)
I was called to patient's bedside to pronounce that patient  has died. No spontaneous movements were present. There was not response to verbal or tactile stimuli. Pupils were mid-dilated and fixed. No breath sounds were appreciated over either lung field. No carotid pulses were palpable. No hear sounds were auscultated over the entire precordium. Patient pronounced dead at  20:30 on 03/28/23 . Family to be notified. and Chaplain and postmortem services to be offered.  Patient was DNR/DNI at the time of death . Patient's medical illness was multiorgan failure related to sepsis. Time of death was 20:30 witnessed by patient's primary RN and another Charity fundraiser.

## 2023-03-25 NOTE — Progress Notes (Signed)
Pt. Nasally suctionned for moderate amount of thick yellow secretions without incident.

## 2023-03-25 NOTE — Progress Notes (Signed)
Progress Note   Patient: [name] AOZ:[id number] DOB: 1934-06-14 DOA: April 19, 2023     2 DOS: the patient was seen and examined on 03/03/2023   Brief hospital course: Taken from H&P.  [name] is a 88 y.o. male with medical history significant of atrial fibrillation (Xarelto), systolic heart failure, chronic kidney disease, hypertension, hyperlipidemia, insulin-dependent type 2 diabetes, dementia who presents for altered mental status.   Per report patient is being altered for the past week, though EMS reports that he was normal and night before.  He was having abdominal pain in the past couple of days and his sugars were elevated.  On presenting to ED patient was febrile at 100.3, tachycardic and tachypneic, saturating well on room air.  Labs pertinent for lactic acidosis at 4.5>2.7 with hydration, leukocytosis at 14.8 with left shift, blood glucose 322, significantly elevated serum creatinine at 4.86, elevated AST and ALT be.  Elevated anion gap at 17.  UA not concerning for UTI.  Initial troponin 377>>322-likely demand ischemia. COVID, influenza and RSV panel was negative.  Blood cultures were obtained.  Portable chest x-ray showed low lung volumes but no acute findings.  CT head showed likely sinusitis.  CT abdomen pelvis showed airway thickening and airway plugging in both lower lobes concerning for bronchitis.  Patient given ceftriaxone and Flagyl in the ED.  Patient also received IV fluid bolus followed by infusion.  10/28: A-fib with RVR, worsening of leukocytosis at 20.1, hyponatremia with sodium at 149, glucose 180, BUN 108, creatinine 4.26, AST 47.  Lactic acidosis resolved. Switched ceftriaxone with cefepime due to worsening leukocytosis. Cardiology was consulted for persistent A-fib with RVR. Patient was also started on amiodarone bolus followed by infusion due to poor response with Lopressor and Cardizem.  According to daughter patient is hallucinating and  altered for about a week, was seen in ED on 20 October and had a recent admission in September at [facility].  He was hallucinating, not eating and drinking and seems like going down the hill.  10/29: Patient remained in A-fib with RVR, not much responding, a large of secretions requiring frequent suctioning, repeat chest x-ray overnight with no acute abnormality.  Renal ultrasound with similar findings of a right hemorrhagic renal cyst requiring nonemergent MRI, and nonobstructing renal calculi and echogenic kidneys consistent with medical renal disease.  Creatinine with very small improvement to 4 this morning, hypernatremia at 148, BNP significantly elevated at 2700 and procalcitonin 0.40.  Patient is in multiorgan failure including heart and kidneys, overall very obtunded to try BiPAP.  Required nonrebreather overnight, he is DNR and DNI, again discussed with son on phone.  A lot of secretions which he is unable to maintain.  Patient has very poor prognosis at this time. Palliative care was also consulted and started discussing goals of care with family.  Had another lengthy discussion with multiple family members at bedside.  They need some time to decide but no recent change and patient seems like going down the hill.   Assessment and Plan: * Sepsis [facility]) Patient met severe sepsis criteria with leukocytosis, tachypnea and tachycardia and fever, also had lactic acidosis and AKI as endorgan dysfunction. Imaging concerning for bronchitis.  Preliminary blood cultures negative. Currently no other obvious source of infection but due to worsening leukocytosis, antibiotics were broadened to cefepime and Flagyl.  Procalcitonin at 0.40 -Continue cefepime and Flagyl for now -Follow-up cultures  Atrial fibrillation with rapid ventricular response (HCC) Patient remained in A-fib with RVR, not  responding well to Lopressor and Cardizem. Cardiology was consulted and started on amiodarone infusion, heart rate  remained in 140s.  Intermittent Lopressor boluses with no response, should avoid Cardizem due to heart failure. Xarelto has been converted to heparin infusion due to altered mental status and unable to take p.o. at this time. -Continue to monitor  Acute encephalopathy Likely acute metabolic encephalopathy , multifactorial with multiorgan dysfunction, severe AKI, remained in A-fib with RVR.  Remained pretty obtunded and unable to take care of his own secretions. Intermittent agitation requiring Ativan and Dilaudid. Patient seems like going down the hill, discussed with cardiology and they were also suggesting palliative care involvement due to poor prognosis based on advanced age and underlying comorbidities and current poor response to conventional treatment. -Palliative care was also consulted -Family might consider moving to comfort care  Elevated troponin Likely demand ischemia with persistent A-fib with RVR. -Continue to monitor  AKI (acute kidney injury) (HCC) With history of CKD stage IIIb.  Creatinine at 4.86 on admission, mild improvement to 4.00 today, baseline appears to be around 1.5-1.6. -Renal ultrasound-with a hemorrhagic cyst and nonobstructing renal stone, echogenicity consistent with medical disease, no obvious obstruction. -Foley catheter was placed on admission -Strict intake and output -Gentle IV fluid -Monitor renal function -Avoid nephrotoxins  Seizure disorder (HCC) No recent seizure reported. -Continue home divalproex -Seizure precaution  Chronic systolic CHF (congestive heart failure) (HCC) EF of 35 to 40% with regional wall motion abnormalities and left atrial enlargement on echo done in March 2024.  Clinically appears euvolemic. Did received IV fluids due to AKI and sepsis.  Significantly elevated BNP at 2700 -Gave 1 dose of IV Lasix due to worsening respiratory status -Holding home diuretics due to concern of sepsis -Monitor volume status  closely  Primary hypertension Blood pressure currently within goal.  On Bumex, Cardizem, and Imdur and metoprolol at home. -Continue home Imdur, Cardizem and metoprolol -Holding Bumex  Type II diabetes mellitus with renal manifestations (HCC) Patient was on 45 units of Lantus at bedtime with SSI at his facility. A1c of 8 -Semglee 25 units nightly -SSI  Hyperlipidemia -Continue home statin  PAD (peripheral artery disease) (HCC) S/p bilateral BKA -Continue home statin and aspirin   Subjective: Patient pretty much unresponsive with intermittent moaning and having upper respiratory secretions when seen today.  Not following any commands.  Physical Exam: Vitals:   03-27-2023 0700 03-27-23 0800 03/27/2023 0900 03/27/2023 1147  BP:  (!) 142/91 118/87   Pulse:  (!) 130 (!) 134   Resp:  (!) 34    Temp:  99.4 F (37.4 C)  98.6 F (37 C)  TempSrc:  Oral    SpO2: 100% 100%    Weight:      Height:       General.  Unresponsive elderly man, intermittent moaning.  Pulmonary.  Lungs clear bilaterally, normal respiratory effort. CV.  Regular rate and rhythm, no JVD, rub or murmur. Abdomen.  Soft, nontender, nondistended, BS positive. CNS.  Unresponsive, not following any commands Extremities.  No edema, no cyanosis, pulses intact and symmetrical.   Data Reviewed: Prior data reviewed  Family Communication: Multiple family members including wife, son and daughter at bedside.  Disposition: Status is: Inpatient Remains inpatient appropriate because: Severity of illness  Planned Discharge Destination: Skilled nursing facility  DVT prophylaxis: Heparin Time spent: 65 minutes  CRITICAL CARE Performed by: Arnetha Courser   Total critical care time: 65 minutes  Critical care time was exclusive of separately billable  procedures and treating other patients.  Critical care was necessary to treat or prevent imminent or life-threatening deterioration.  Critical care was time spent  personally by me on the following activities: development of treatment plan with patient and/or surrogate as well as nursing, discussions with consultants, evaluation of patient's response to treatment, examination of patient, obtaining history from patient or surrogate, ordering and performing treatments and interventions, ordering and review of laboratory studies, ordering and review of radiographic studies, pulse oximetry and re-evaluation of patient's condition.   This record has been created using Conservation officer, historic buildings. Errors have been sought and corrected,but may not always be located. Such creation errors do not reflect on the standard of care.   Author: Arnetha Courser, MD 03/18/2023 2:04 PM  For on call review [email or web address].

## 2023-03-25 NOTE — Assessment & Plan Note (Signed)
EF of 35 to 40% with regional wall motion abnormalities and left atrial enlargement on echo done in March 2024.  Clinically appears euvolemic. Did received IV fluids due to AKI and sepsis.  Significantly elevated BNP at 2700 -Gave 1 dose of IV Lasix due to worsening respiratory status -Holding home diuretics due to concern of sepsis -Monitor volume status closely

## 2023-03-25 DEATH — deceased
